# Patient Record
Sex: Female | Born: 1950
Health system: Southern US, Community
[De-identification: ages and names within clinical notes are randomized; demographics above are authoritative.]

## PROBLEM LIST (undated history)

## (undated) DIAGNOSIS — H5340 Unspecified visual field defects: Secondary | ICD-10-CM

## (undated) DIAGNOSIS — E785 Hyperlipidemia, unspecified: Secondary | ICD-10-CM

## (undated) DIAGNOSIS — D259 Leiomyoma of uterus, unspecified: Secondary | ICD-10-CM

## (undated) DIAGNOSIS — E860 Dehydration: Secondary | ICD-10-CM

## (undated) DIAGNOSIS — R5381 Other malaise: Secondary | ICD-10-CM

## (undated) DIAGNOSIS — N289 Disorder of kidney and ureter, unspecified: Secondary | ICD-10-CM

## (undated) DIAGNOSIS — N182 Chronic kidney disease, stage 2 (mild): Secondary | ICD-10-CM

## (undated) DIAGNOSIS — J039 Acute tonsillitis, unspecified: Secondary | ICD-10-CM

## (undated) DIAGNOSIS — N189 Chronic kidney disease, unspecified: Secondary | ICD-10-CM

## (undated) DIAGNOSIS — K37 Unspecified appendicitis: Secondary | ICD-10-CM

## (undated) DIAGNOSIS — I10 Essential (primary) hypertension: Secondary | ICD-10-CM

## (undated) DIAGNOSIS — H269 Unspecified cataract: Secondary | ICD-10-CM

## (undated) DIAGNOSIS — Z8669 Personal history of other diseases of the nervous system and sense organs: Secondary | ICD-10-CM

## (undated) DIAGNOSIS — I69391 Dysphagia following cerebral infarction: Secondary | ICD-10-CM

## (undated) DIAGNOSIS — R102 Pelvic and perineal pain: Secondary | ICD-10-CM

## (undated) DIAGNOSIS — R5383 Other fatigue: Secondary | ICD-10-CM

## (undated) DIAGNOSIS — I509 Heart failure, unspecified: Secondary | ICD-10-CM

## (undated) DIAGNOSIS — J45909 Unspecified asthma, uncomplicated: Secondary | ICD-10-CM

## (undated) DIAGNOSIS — R0600 Dyspnea, unspecified: Secondary | ICD-10-CM

## (undated) DIAGNOSIS — D219 Benign neoplasm of connective and other soft tissue, unspecified: Secondary | ICD-10-CM

## (undated) DIAGNOSIS — Z992 Dependence on renal dialysis: Secondary | ICD-10-CM

## (undated) DIAGNOSIS — J342 Deviated nasal septum: Secondary | ICD-10-CM

## (undated) DIAGNOSIS — K922 Gastrointestinal hemorrhage, unspecified: Secondary | ICD-10-CM

## (undated) DIAGNOSIS — D649 Anemia, unspecified: Secondary | ICD-10-CM

## (undated) DIAGNOSIS — N644 Mastodynia: Secondary | ICD-10-CM

## (undated) DIAGNOSIS — R011 Cardiac murmur, unspecified: Secondary | ICD-10-CM

## (undated) DIAGNOSIS — E669 Obesity, unspecified: Secondary | ICD-10-CM

## (undated) DIAGNOSIS — Z6837 Body mass index (BMI) 37.0-37.9, adult: Secondary | ICD-10-CM

## (undated) DIAGNOSIS — R0789 Other chest pain: Secondary | ICD-10-CM

## (undated) DIAGNOSIS — K4090 Unilateral inguinal hernia, without obstruction or gangrene, not specified as recurrent: Secondary | ICD-10-CM

## (undated) DIAGNOSIS — T8859XA Other complications of anesthesia, initial encounter: Secondary | ICD-10-CM

## (undated) DIAGNOSIS — N186 End stage renal disease: Secondary | ICD-10-CM

## (undated) DIAGNOSIS — Z8632 Personal history of gestational diabetes: Secondary | ICD-10-CM

## (undated) DIAGNOSIS — O0990 Supervision of high risk pregnancy, unspecified, unspecified trimester: Secondary | ICD-10-CM

## (undated) DIAGNOSIS — K219 Gastro-esophageal reflux disease without esophagitis: Secondary | ICD-10-CM

## (undated) DIAGNOSIS — T7840XA Allergy, unspecified, initial encounter: Secondary | ICD-10-CM

## (undated) DIAGNOSIS — Z87898 Personal history of other specified conditions: Secondary | ICD-10-CM

## (undated) HISTORY — DX: Unspecified cataract: H26.9

## (undated) HISTORY — DX: Unspecified appendicitis: K37

## (undated) HISTORY — PX: MYOMECTOMY: SHX85

## (undated) HISTORY — DX: Other chest pain: R07.89

## (undated) HISTORY — DX: Leiomyoma of uterus, unspecified: D25.9

## (undated) HISTORY — DX: Benign neoplasm of connective and other soft tissue, unspecified: D21.9

## (undated) HISTORY — DX: Personal history of gestational diabetes: Z86.32

## (undated) HISTORY — DX: Supervision of high risk pregnancy, unspecified, unspecified trimester: O09.90

## (undated) HISTORY — DX: Hyperlipidemia, unspecified: E78.5

## (undated) HISTORY — PX: EYE SURGERY: SHX253

## (undated) HISTORY — DX: Gastro-esophageal reflux disease without esophagitis: K21.9

## (undated) HISTORY — DX: Unspecified asthma, uncomplicated: J45.909

## (undated) HISTORY — DX: Mastodynia: N64.4

## (undated) HISTORY — DX: Allergy, unspecified, initial encounter: T78.40XA

## (undated) HISTORY — DX: Obesity, unspecified: E66.9

## (undated) HISTORY — PX: COLONOSCOPY: SHX174

## (undated) HISTORY — DX: Body mass index (BMI) 37.0-37.9, adult: Z68.37

## (undated) HISTORY — DX: Unilateral inguinal hernia, without obstruction or gangrene, not specified as recurrent: K40.90

## (undated) HISTORY — DX: Acute tonsillitis, unspecified: J03.90

## (undated) HISTORY — DX: Other fatigue: R53.83

## (undated) HISTORY — DX: Other malaise: R53.81

## (undated) HISTORY — DX: Disorder of kidney and ureter, unspecified: N28.9

## (undated) HISTORY — DX: Deviated nasal septum: J34.2

## (undated) HISTORY — DX: Cardiac murmur, unspecified: R01.1

## (undated) HISTORY — DX: Dyspnea, unspecified: R06.00

## (undated) HISTORY — DX: Essential (primary) hypertension: I10

## (undated) HISTORY — DX: Chronic kidney disease, stage 2 (mild): N18.2

## (undated) HISTORY — DX: Dehydration: E86.0

## (undated) HISTORY — DX: Pelvic and perineal pain: R10.2

---
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
## 1969-10-24 DIAGNOSIS — J342 Deviated nasal septum: Secondary | ICD-10-CM

## 1969-10-24 HISTORY — PX: RHINOPLASTY: SUR1284

## 1969-10-24 HISTORY — DX: Deviated nasal septum: J34.2

## 1978-10-24 DIAGNOSIS — D259 Leiomyoma of uterus, unspecified: Secondary | ICD-10-CM

## 1978-10-24 DIAGNOSIS — D219 Benign neoplasm of connective and other soft tissue, unspecified: Secondary | ICD-10-CM

## 1978-10-24 HISTORY — DX: Benign neoplasm of connective and other soft tissue, unspecified: D21.9

## 1978-10-24 HISTORY — DX: Leiomyoma of uterus, unspecified: D25.9

## 1983-10-25 DIAGNOSIS — O0990 Supervision of high risk pregnancy, unspecified, unspecified trimester: Secondary | ICD-10-CM

## 1983-10-25 HISTORY — DX: Supervision of high risk pregnancy, unspecified, unspecified trimester: O09.90

## 1998-09-09 ENCOUNTER — Other Ambulatory Visit: Admission: RE | Admit: 1998-09-09 | Discharge: 1998-09-09 | Payer: Self-pay | Admitting: Obstetrics and Gynecology

## 1998-10-15 ENCOUNTER — Ambulatory Visit (HOSPITAL_COMMUNITY): Admission: RE | Admit: 1998-10-15 | Discharge: 1998-10-15 | Payer: Self-pay | Admitting: Obstetrics and Gynecology

## 1998-10-15 ENCOUNTER — Encounter: Payer: Self-pay | Admitting: Obstetrics and Gynecology

## 1998-11-05 ENCOUNTER — Other Ambulatory Visit: Admission: RE | Admit: 1998-11-05 | Discharge: 1998-11-05 | Payer: Self-pay | Admitting: Obstetrics and Gynecology

## 1998-12-25 ENCOUNTER — Encounter: Payer: Self-pay | Admitting: Emergency Medicine

## 1998-12-25 ENCOUNTER — Inpatient Hospital Stay (HOSPITAL_COMMUNITY): Admission: EM | Admit: 1998-12-25 | Discharge: 1998-12-27 | Payer: Self-pay | Admitting: Emergency Medicine

## 1999-12-29 ENCOUNTER — Ambulatory Visit (HOSPITAL_COMMUNITY): Admission: RE | Admit: 1999-12-29 | Discharge: 1999-12-29 | Payer: Self-pay | Admitting: Obstetrics and Gynecology

## 1999-12-29 ENCOUNTER — Encounter: Payer: Self-pay | Admitting: Obstetrics and Gynecology

## 2000-01-28 ENCOUNTER — Other Ambulatory Visit: Admission: RE | Admit: 2000-01-28 | Discharge: 2000-01-28 | Payer: Self-pay | Admitting: Obstetrics & Gynecology

## 2000-04-10 ENCOUNTER — Other Ambulatory Visit: Admission: RE | Admit: 2000-04-10 | Discharge: 2000-04-10 | Payer: Self-pay | Admitting: Obstetrics and Gynecology

## 2000-12-13 ENCOUNTER — Encounter: Payer: Self-pay | Admitting: Obstetrics and Gynecology

## 2000-12-13 ENCOUNTER — Encounter: Admission: RE | Admit: 2000-12-13 | Discharge: 2000-12-13 | Payer: Self-pay | Admitting: Obstetrics and Gynecology

## 2001-04-10 ENCOUNTER — Other Ambulatory Visit: Admission: RE | Admit: 2001-04-10 | Discharge: 2001-04-10 | Payer: Self-pay | Admitting: Obstetrics and Gynecology

## 2001-04-20 ENCOUNTER — Ambulatory Visit (HOSPITAL_COMMUNITY): Admission: RE | Admit: 2001-04-20 | Discharge: 2001-04-20 | Payer: Self-pay | Admitting: Obstetrics and Gynecology

## 2001-04-20 ENCOUNTER — Encounter: Payer: Self-pay | Admitting: Obstetrics and Gynecology

## 2001-05-24 ENCOUNTER — Encounter (INDEPENDENT_AMBULATORY_CARE_PROVIDER_SITE_OTHER): Payer: Self-pay | Admitting: Specialist

## 2001-05-24 ENCOUNTER — Ambulatory Visit (HOSPITAL_COMMUNITY): Admission: RE | Admit: 2001-05-24 | Discharge: 2001-05-24 | Payer: Self-pay | Admitting: Obstetrics and Gynecology

## 2001-05-24 ENCOUNTER — Encounter: Payer: Self-pay | Admitting: Obstetrics and Gynecology

## 2001-10-24 HISTORY — PX: ROTATOR CUFF REPAIR: SHX139

## 2001-12-18 ENCOUNTER — Encounter: Payer: Self-pay | Admitting: Obstetrics and Gynecology

## 2001-12-18 ENCOUNTER — Encounter: Admission: RE | Admit: 2001-12-18 | Discharge: 2001-12-18 | Payer: Self-pay | Admitting: Obstetrics and Gynecology

## 2002-04-09 ENCOUNTER — Ambulatory Visit (HOSPITAL_BASED_OUTPATIENT_CLINIC_OR_DEPARTMENT_OTHER): Admission: RE | Admit: 2002-04-09 | Discharge: 2002-04-09 | Payer: Self-pay | Admitting: Orthopaedic Surgery

## 2002-06-03 ENCOUNTER — Other Ambulatory Visit: Admission: RE | Admit: 2002-06-03 | Discharge: 2002-06-03 | Payer: Self-pay | Admitting: Obstetrics and Gynecology

## 2003-01-02 ENCOUNTER — Encounter: Payer: Self-pay | Admitting: Obstetrics and Gynecology

## 2003-01-02 ENCOUNTER — Encounter: Admission: RE | Admit: 2003-01-02 | Discharge: 2003-01-02 | Payer: Self-pay | Admitting: Obstetrics and Gynecology

## 2003-04-29 ENCOUNTER — Encounter: Payer: Self-pay | Admitting: Emergency Medicine

## 2003-04-29 ENCOUNTER — Emergency Department (HOSPITAL_COMMUNITY): Admission: EM | Admit: 2003-04-29 | Discharge: 2003-04-29 | Payer: Self-pay | Admitting: Emergency Medicine

## 2003-06-17 ENCOUNTER — Other Ambulatory Visit: Admission: RE | Admit: 2003-06-17 | Discharge: 2003-06-17 | Payer: Self-pay | Admitting: Obstetrics and Gynecology

## 2003-06-24 ENCOUNTER — Ambulatory Visit (HOSPITAL_COMMUNITY): Admission: RE | Admit: 2003-06-24 | Discharge: 2003-06-24 | Payer: Self-pay | Admitting: Obstetrics and Gynecology

## 2003-06-24 ENCOUNTER — Encounter: Payer: Self-pay | Admitting: Obstetrics and Gynecology

## 2004-01-21 ENCOUNTER — Encounter: Admission: RE | Admit: 2004-01-21 | Discharge: 2004-01-21 | Payer: Self-pay | Admitting: Internal Medicine

## 2004-07-13 ENCOUNTER — Other Ambulatory Visit: Admission: RE | Admit: 2004-07-13 | Discharge: 2004-07-13 | Payer: Self-pay | Admitting: Obstetrics and Gynecology

## 2004-12-03 ENCOUNTER — Ambulatory Visit (HOSPITAL_COMMUNITY): Admission: RE | Admit: 2004-12-03 | Discharge: 2004-12-03 | Payer: Self-pay | Admitting: Obstetrics and Gynecology

## 2004-12-03 IMAGING — US US PELVIS COMPLETE MODIFY
1 series · 18 of 25 positions shown · non-contrast
Comparison: none

CLINICAL DATA: Pelvic pain.  History of fibroids.
 TRANSVAGINAL AND TRANSABDOMINAL NON-OB PELVIC ULTRASOUND:
 Transabdominal and endovaginal images are obtained.  Multiple fibroids are again noted.  The overall uterine dimensions are 7.6 x 3.8 x 6.4 cm.  The largest fibroid is myometrial to the left of midline at the mid segment and measures 3.5 cm in greatest dimension.  There are 3 smaller right-sided myometrial fibroids at the mid segment and fundus which measure 2.7 cm, 2.2 cm, and 1 cm.  The endometrial stripe is only partially seen because of the adjacent fibroids, but the visualized portion is thin and homogeneous, measuring 4 mm in width.  Both ovaries have a normal size, shape, and appearance.  The right ovary measures 3.2 x 1.8 x 1.8 cm, and the left ovary measures 2.9 x 2 x 2.2 cm.  No free pelvic fluid is present.

[Series 1: us pelvis complete modify · 18 of 50 slices shown]
[im 1/50]
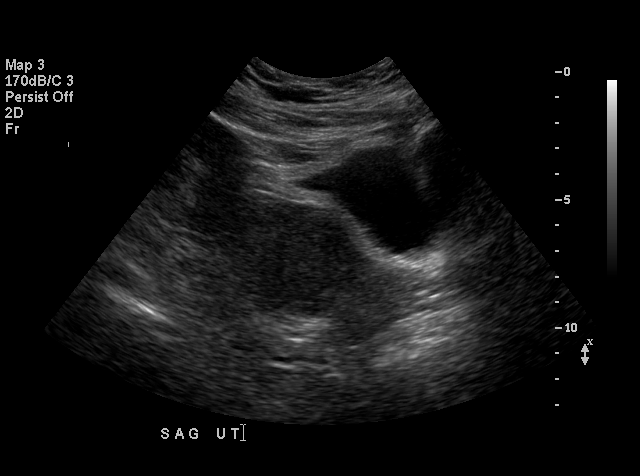
[im 5/50]
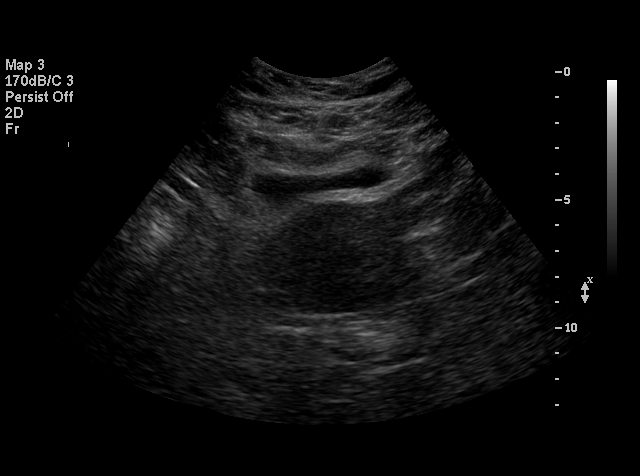
[im 7/50]
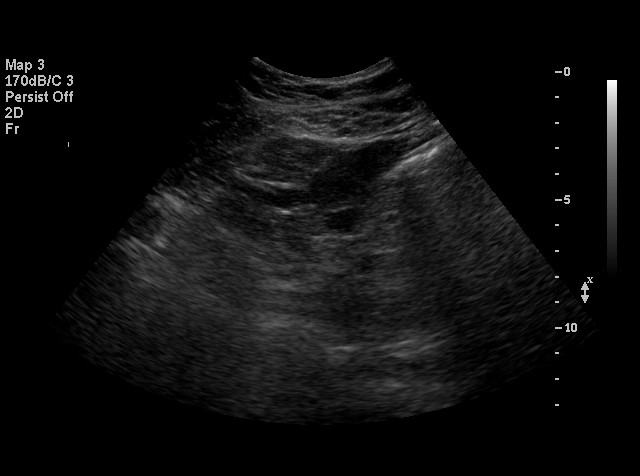
[im 9/50]
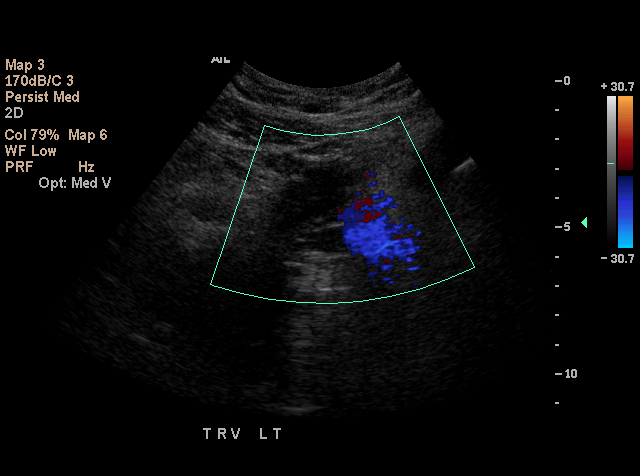
[im 13/50]
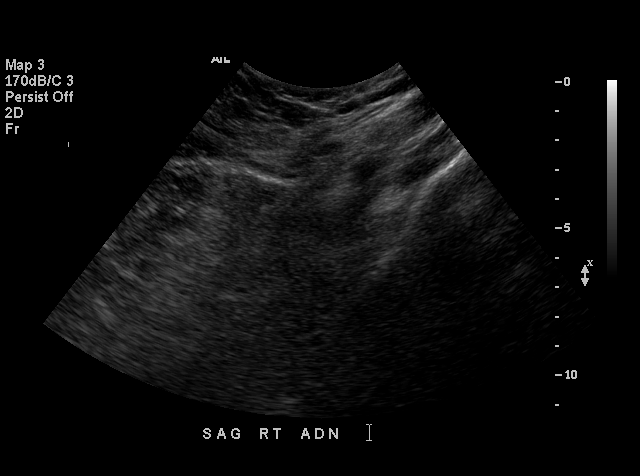
[im 15/50]
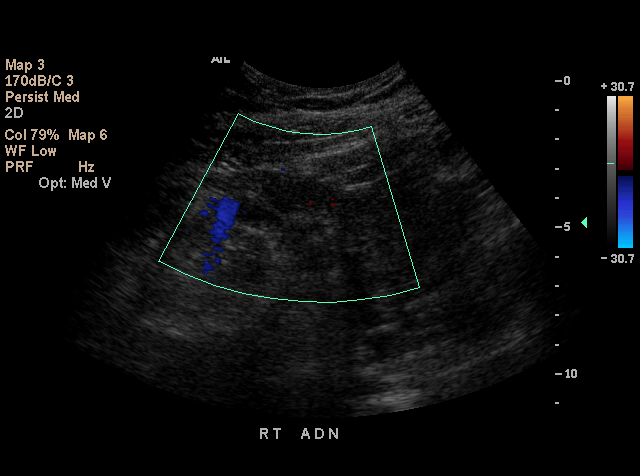
[im 19/50]
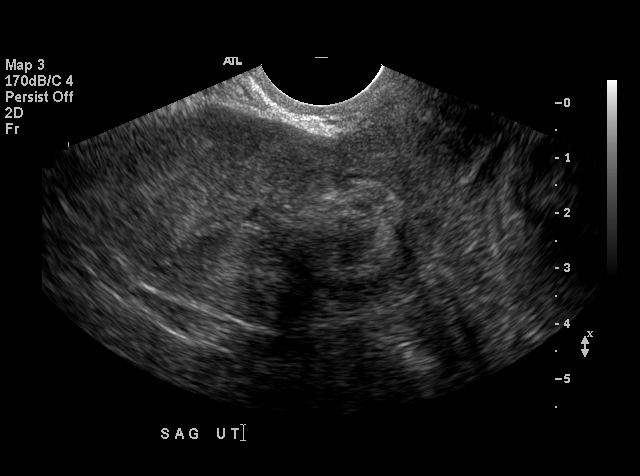
[im 21/50]
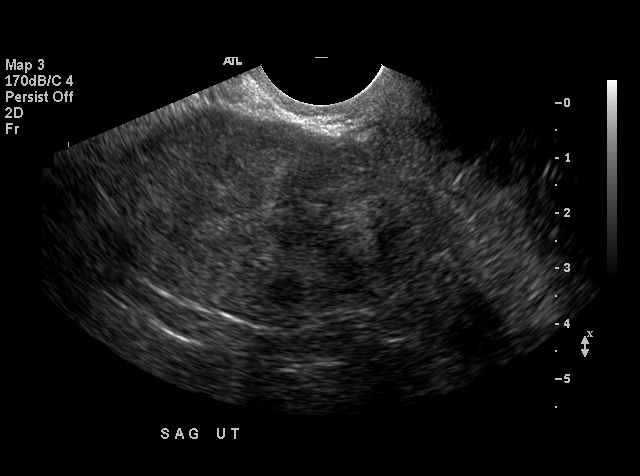
[im 23/50]
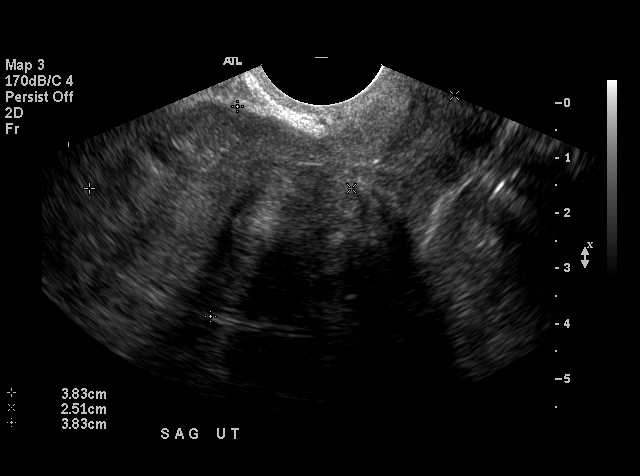
[im 27/50]
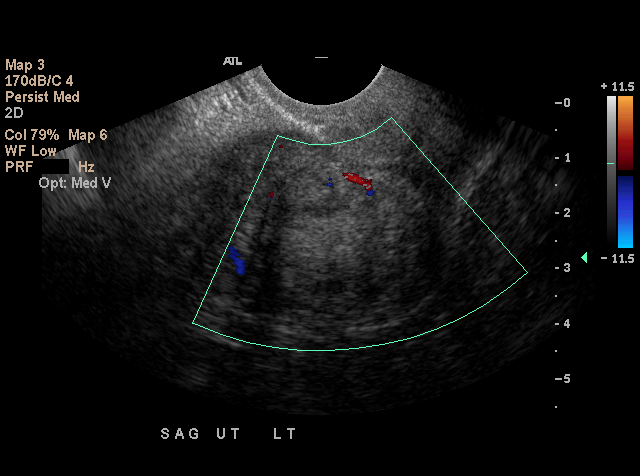
[im 29/50]
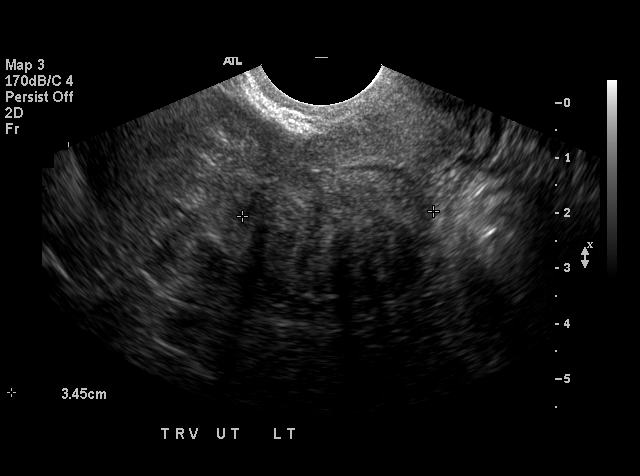
[im 31/50]
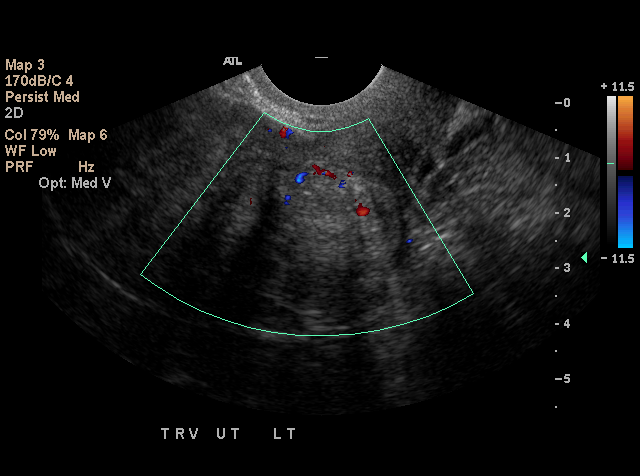
[im 35/50]
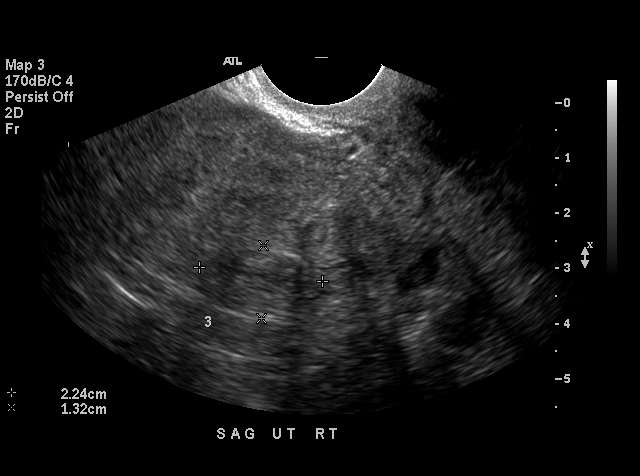
[im 37/50]
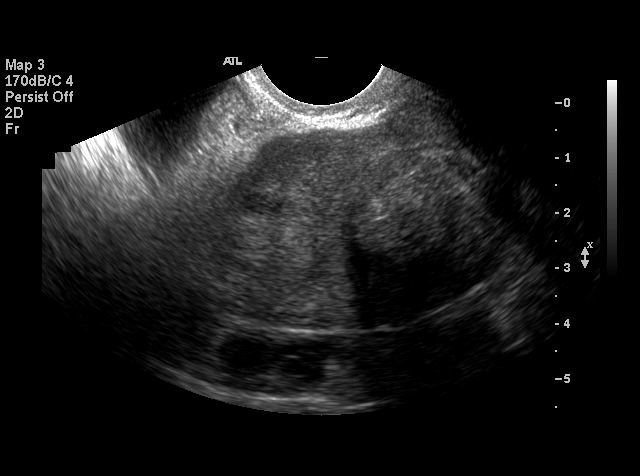
[im 41/50]
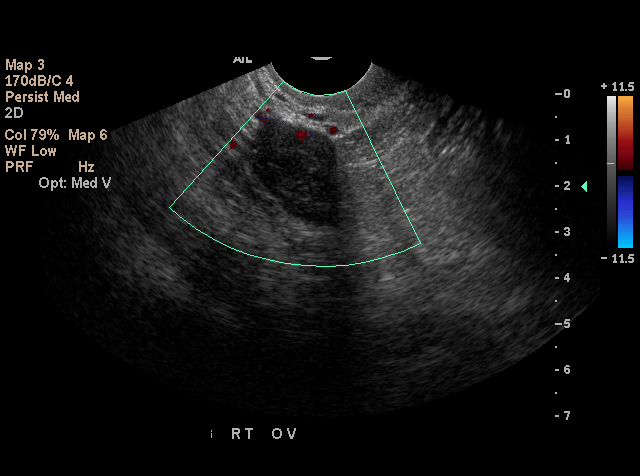
[im 43/50]
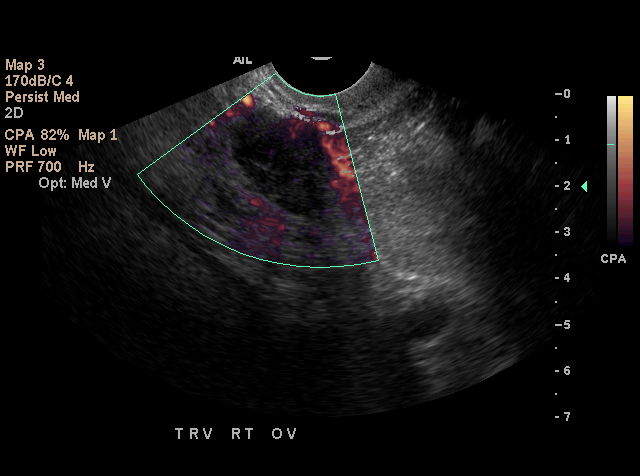
[im 45/50]
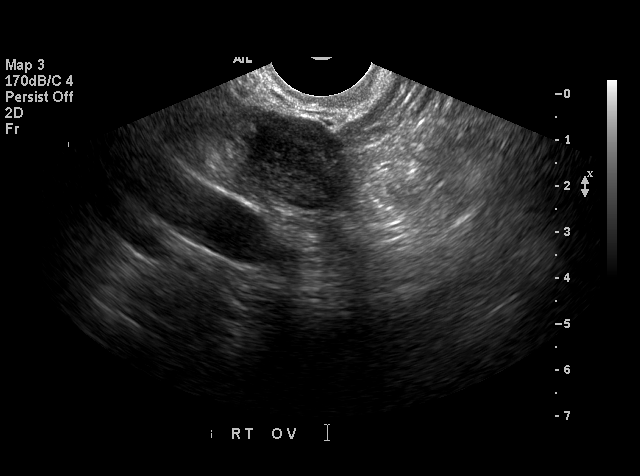
[im 50/50]
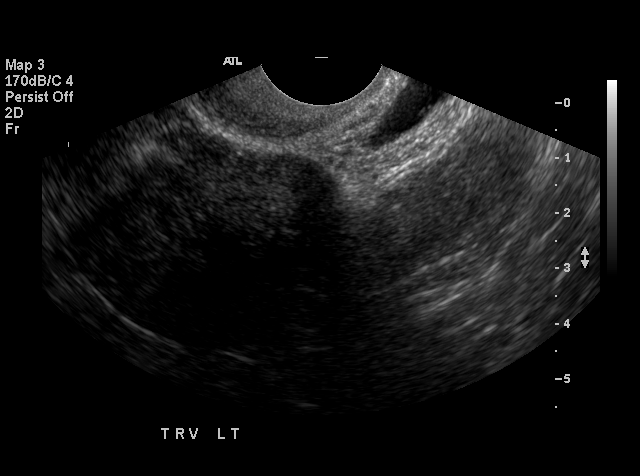

[18 of 25 positions shown; findings below may reference images not displayed]

IMPRESSION: Fibroid uterus, as described above.  The visualized portion of the endometrial stripe has a normal appearance.

## 2004-12-13 ENCOUNTER — Ambulatory Visit (HOSPITAL_COMMUNITY): Admission: RE | Admit: 2004-12-13 | Discharge: 2004-12-13 | Payer: Self-pay | Admitting: Obstetrics and Gynecology

## 2004-12-13 IMAGING — CT CT ABDOMEN W/ CM
1 of 4 series · 14 of 32 positions shown, 19 images · IV contrast ([ID] READICAT & [ID] OMNI [id number])
Comparison: none

CLINICAL DATA: Right lower quadrant pain and bloating for one month; previous appendectomy; inguinal hernia repair; C-section; myomectomy
TECHNIQUE: Images were obtained from the dome of the diaphragm through the symphysis pubis after intravenous and oral contrast.  150 mL of Omnipaque 300 was given intravenously.  
 CT ABDOMEN WITH CONTRAST:
 Lung bases are clear.  There is some pleural scarring at both bases.
 There is a low density lesion in the posterior segment of the right lobe of the liver medially that completely fills in with contrast on delayed images.  This is likely a small hemangioma.  No abnormality is noted in the spleen.  Adrenal glands and kidneys are normal.  Pancreas and gallbladder are normal.  No abdominal mass or ascites is noted.  No retroperitoneal adenopathy is seen.

[Series 2: — · axial · 0.68mm/px · z∈[-447,-49]mm · 14 of 76 slices shown, 19 images]
[im 5/76  soft-tissue]
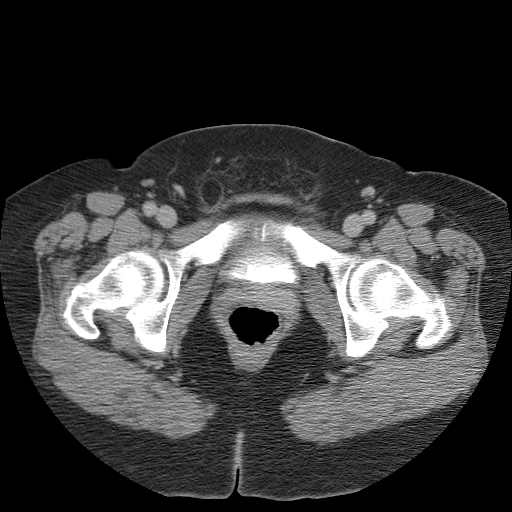
[im 5/76  bone]
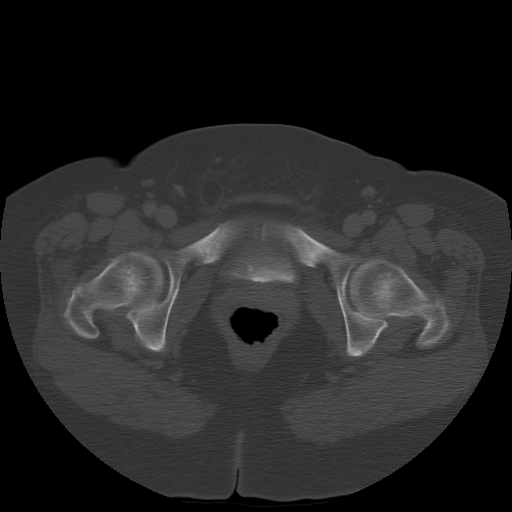
[im 9/76  soft-tissue]
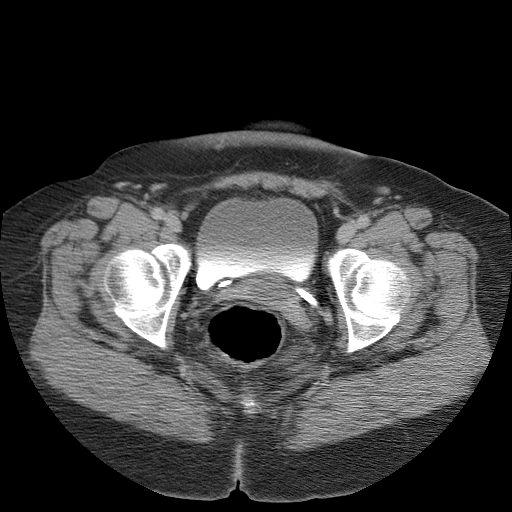
[im 17/76  soft-tissue]
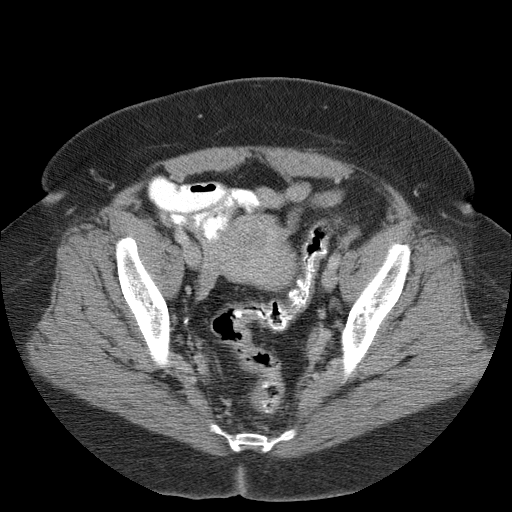
[im 21/76  soft-tissue]
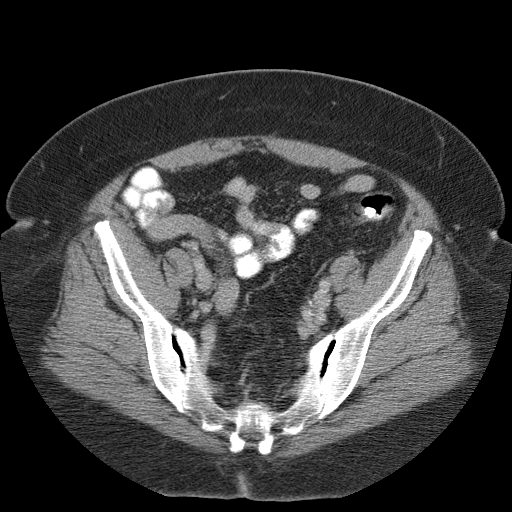
[im 26/76  soft-tissue]
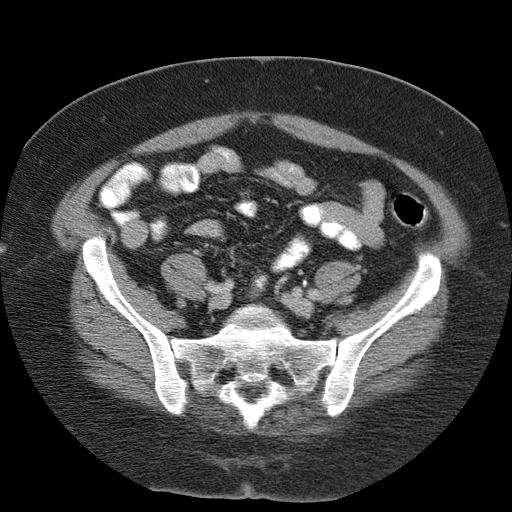
[im 34/76  soft-tissue]
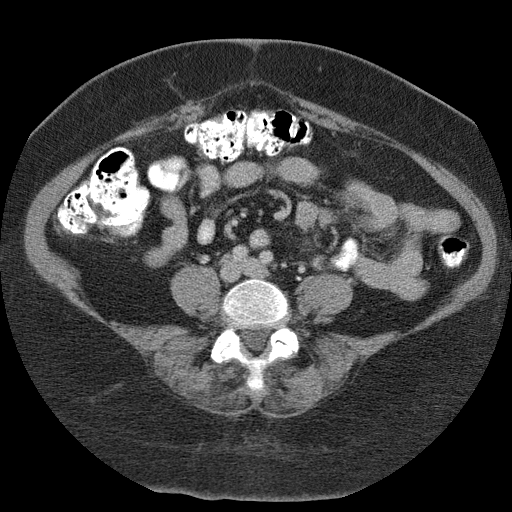
[im 38/76  soft-tissue]
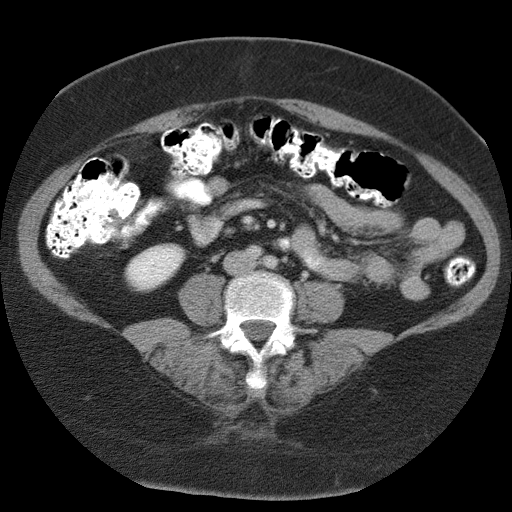
[im 42/76  soft-tissue]
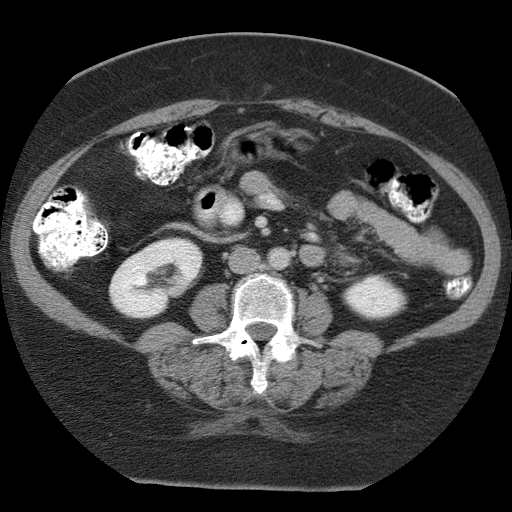
[im 51/76  soft-tissue]
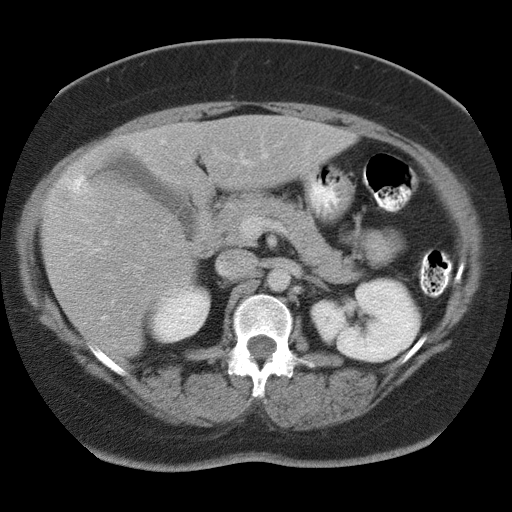
[im 51/76  bone]
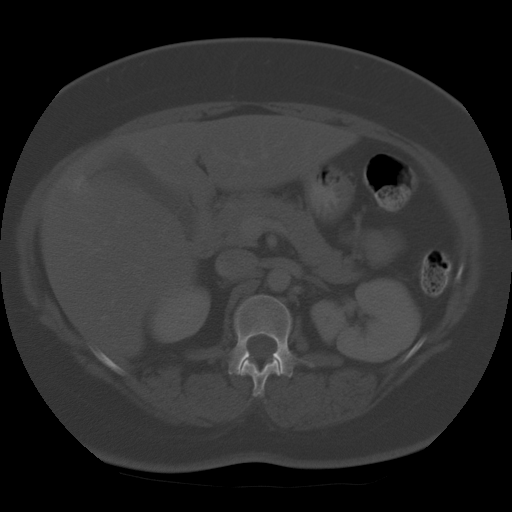
[im 55/76  soft-tissue]
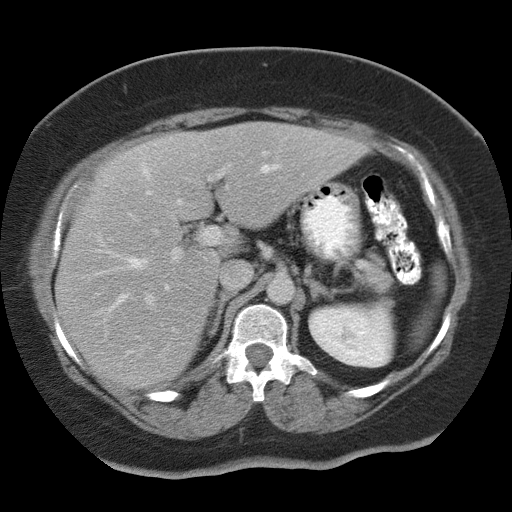
[im 59/76  soft-tissue]
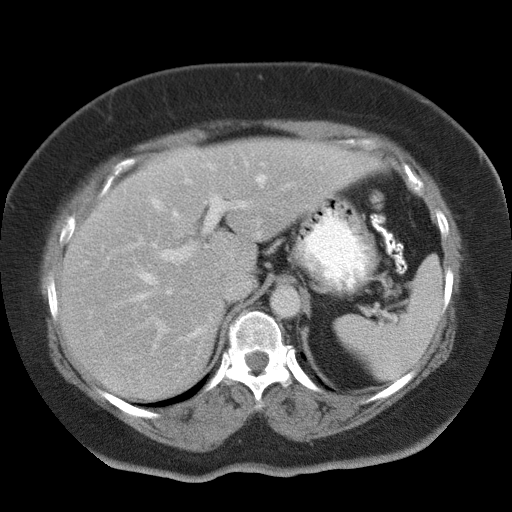
[im 59/76  lung]
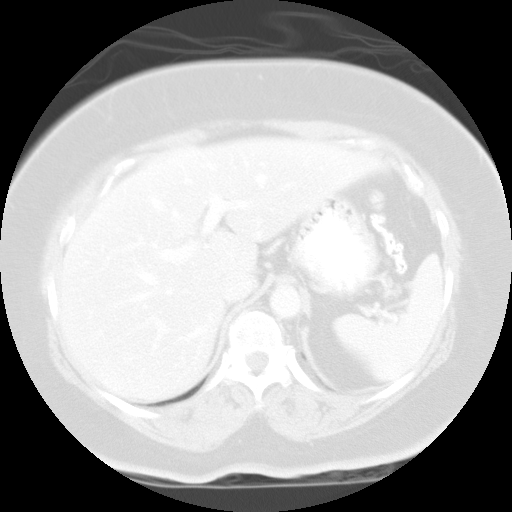
[im 63/76  lung]
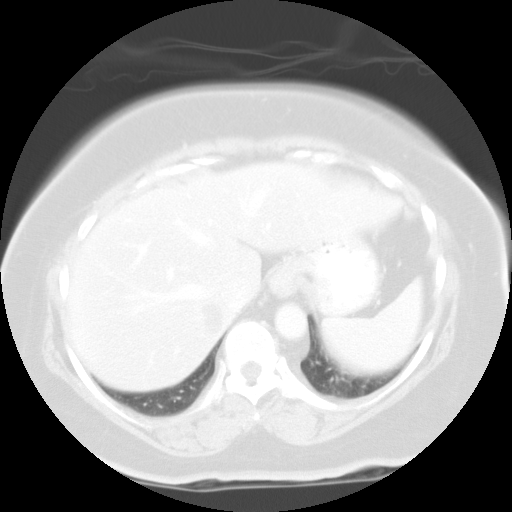
[im 67/76  soft-tissue]
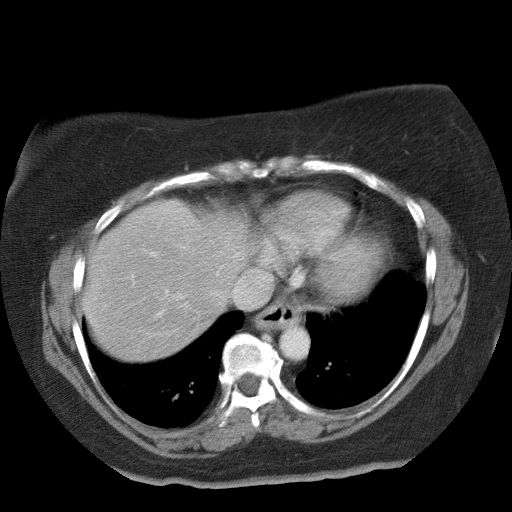
[im 67/76  lung]
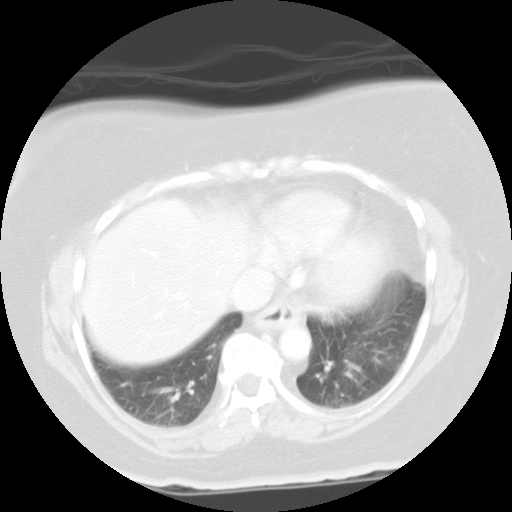
[im 71/76  soft-tissue]
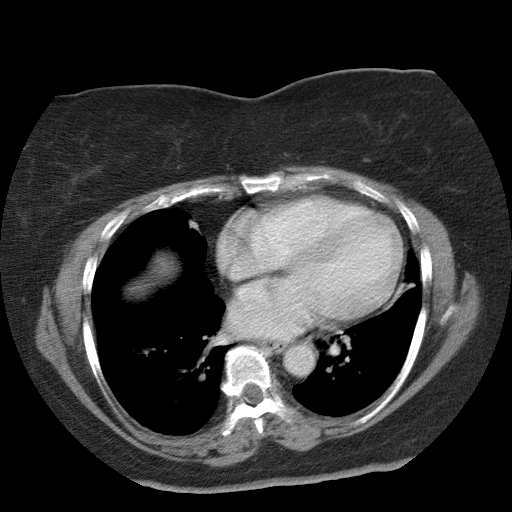
[im 71/76  lung]
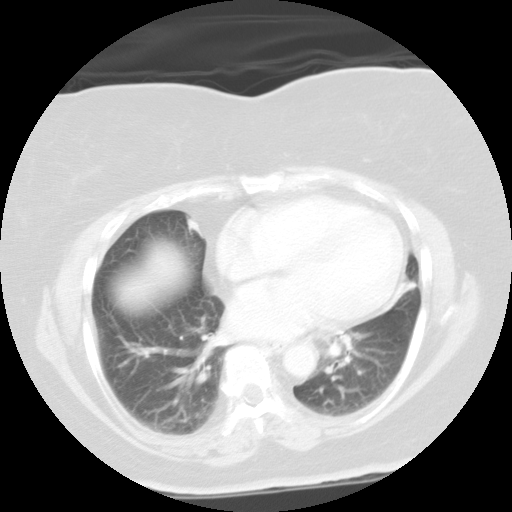

[14 of 32 positions shown; findings below may reference images not displayed]

IMPRESSION: Small hemangioma in the liver.  Mild pleural scarring at the lung bases.  No other abnormality noted. 
 CT OF THE PELVIS WITH CONTRAST:
 No pelvic mass or adenopathy is identified.  No free pelvic fluid is noted.  Uterus and ovaries have a normal contour.
IMPRESSION: Normal CT pelvis.

## 2005-01-13 ENCOUNTER — Ambulatory Visit: Payer: Self-pay | Admitting: Gastroenterology

## 2005-02-08 ENCOUNTER — Ambulatory Visit (HOSPITAL_COMMUNITY): Admission: RE | Admit: 2005-02-08 | Discharge: 2005-02-08 | Payer: Self-pay | Admitting: Gastroenterology

## 2005-02-08 ENCOUNTER — Ambulatory Visit: Payer: Self-pay | Admitting: Gastroenterology

## 2005-11-30 ENCOUNTER — Encounter: Admission: RE | Admit: 2005-11-30 | Discharge: 2005-11-30 | Payer: Self-pay | Admitting: Obstetrics and Gynecology

## 2005-12-13 ENCOUNTER — Other Ambulatory Visit: Admission: RE | Admit: 2005-12-13 | Discharge: 2005-12-13 | Payer: Self-pay | Admitting: Obstetrics and Gynecology

## 2006-04-28 ENCOUNTER — Ambulatory Visit (HOSPITAL_COMMUNITY): Admission: RE | Admit: 2006-04-28 | Discharge: 2006-04-28 | Payer: Self-pay | Admitting: Obstetrics and Gynecology

## 2006-04-28 ENCOUNTER — Encounter (INDEPENDENT_AMBULATORY_CARE_PROVIDER_SITE_OTHER): Payer: Self-pay | Admitting: Specialist

## 2006-12-12 ENCOUNTER — Encounter: Admission: RE | Admit: 2006-12-12 | Discharge: 2006-12-12 | Payer: Self-pay | Admitting: Obstetrics and Gynecology

## 2007-01-11 ENCOUNTER — Ambulatory Visit (HOSPITAL_COMMUNITY): Admission: RE | Admit: 2007-01-11 | Discharge: 2007-01-11 | Payer: Self-pay | Admitting: Interventional Cardiology

## 2007-01-11 IMAGING — CT CT HEART WO/W CTA ONLY W/ CA
1 of 4 series · 12 of 20 positions shown, 15 images · IV contrast (APPLIED)
Comparison: None available.

Addendum Begins?Original report by Dr. DEVJAKOVITS.  Following addendum by Dr. DEVJAKOVITS, [DATE].?
 OVER-READ INTERPRETATION ? CHEST CT:
 The following report provided by [REDACTED] is an over-read interpretation of the non-cardiac portion of this coronary CTA study.  This does not include interpretation of cardiac or coronary anatomy or pathology.
 Extracardiac Findings:  No significant lymphadenopathy within the visualized mediastinum.  Moderate-sized hiatal hernia.  The visualized upper abdomen is otherwise unremarkable.  Focus of nodular scarring deep in the lingula with associated adjacent pleural thickening and scarring.  No significant abnormalities within the visualized lungs.  Mid and lower thoracic spondylosis noted with mild scoliosis convex right.
CLINICAL DATA: Dr. DEVJAKOVITS is 56 years of age and has longstanding diabetes, obesity, and has recently been having chest discomfort.  An EKG was performed that demonstrated poor R-wave progression.  Given her risk factors, coronary CT angio is being performed to rule out obstructive coronary disease.  She is not felt to be a good candidate for myocardial perfusion imaging because of large breasts that increase the likelihood of soft tissue attenuation artifact.
CORONARY CT ANGIOGRAPHY, INCLUDING EVALUATION OF CARDIAC MORPHOLOGY AND LEFT VENTRICULAR FUNCTION:
Referring physicians: DEVJAKOVITS.; DEVJAKOVITS, M.D.
TECHNIQUE: The patient received 75 mg of Lopressor within the 12 hours prior to the examination and an additional 20 mg of IV Lopressor resulting in heart rates in the mid 60?s during scanning. 
An AP topogram unenhanced CT through the heart was performed at 3-mm collimation for the purposes of calcium scoring.  20 cc of Omnipaque 350 was used as a test bolus through the region of interest, placed in the ascending aorta just above the origin of the coronary arteries.  She then received an additional 80 cc of Omnipaque 350 for the purpose of coronary CT angiography followed by a saline chaser bolus.  Gantry speed was 330 milliseconds using 0.6-mm collimation with 0.4-mm overlap.

[Series 10: 70% only · axial · 0.45mm/px · z∈[-261,-170]mm · 12 of 268 slices shown, 15 images]
[im 20/268  vessel]
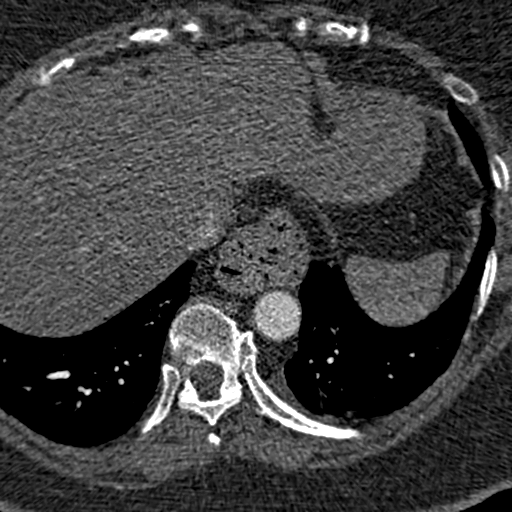
[im 20/268  lung]
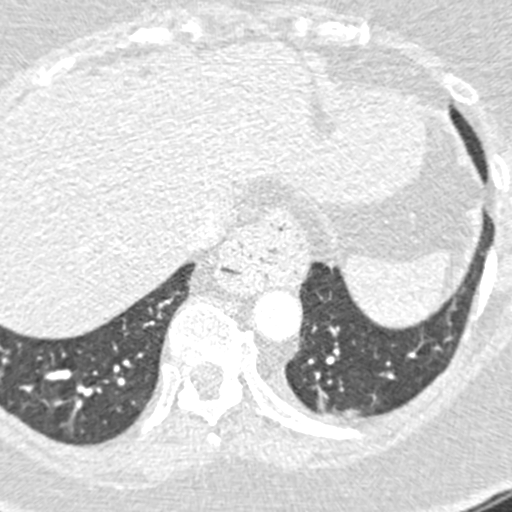
[im 39/268  vessel]
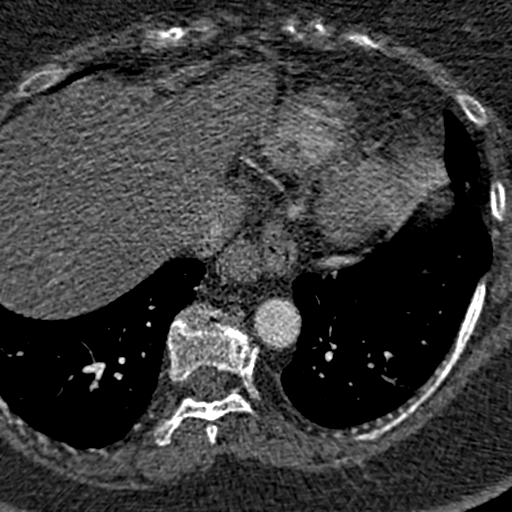
[im 58/268  vessel]
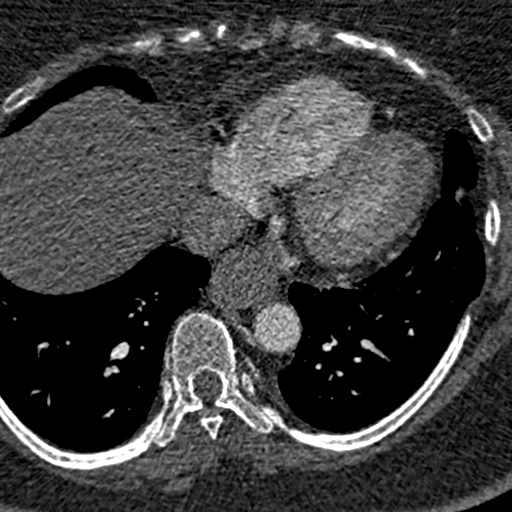
[im 77/268  vessel]
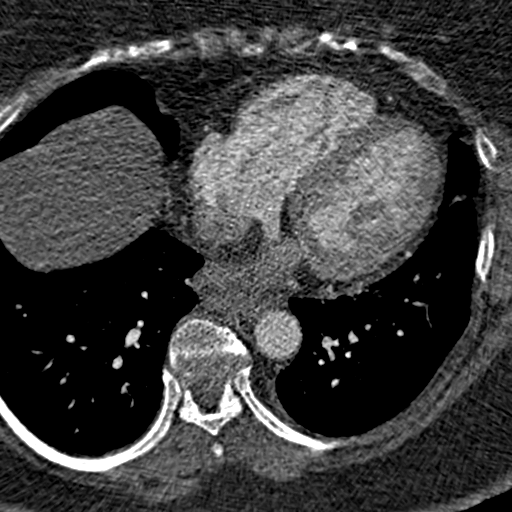
[im 96/268  vessel]
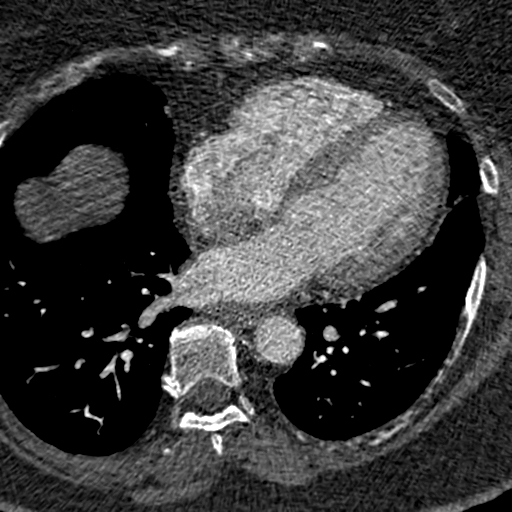
[im 96/268  lung]
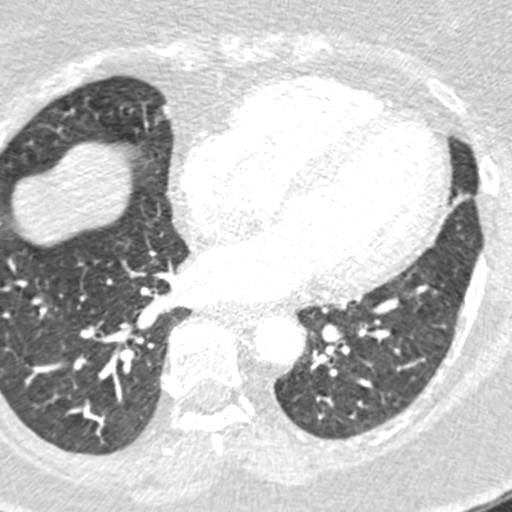
[im 115/268  vessel]
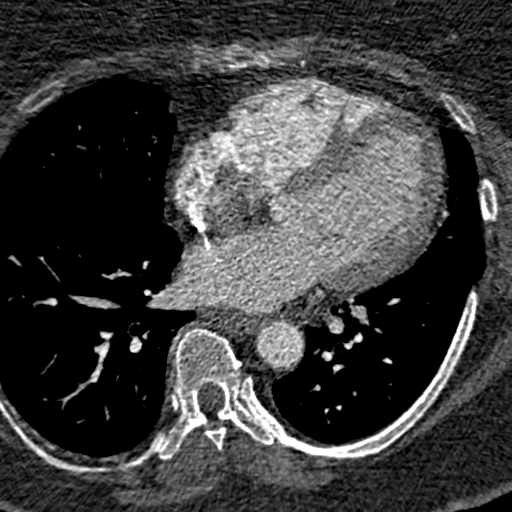
[im 153/268  vessel]
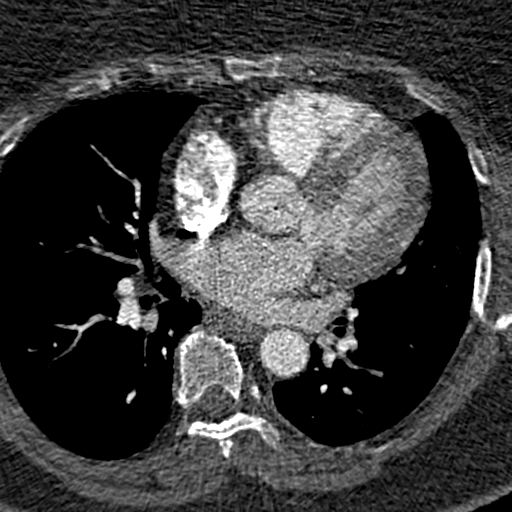
[im 172/268  vessel]
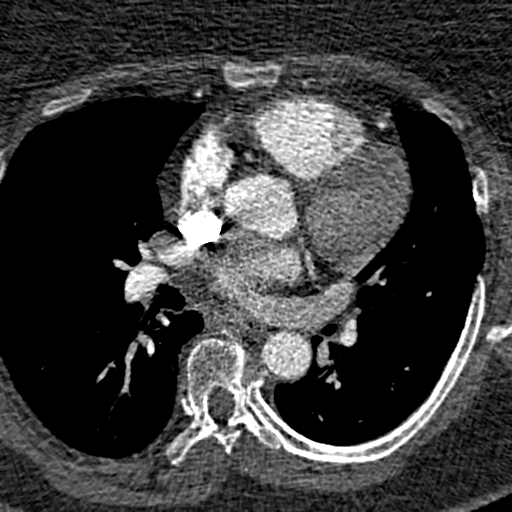
[im 191/268  vessel]
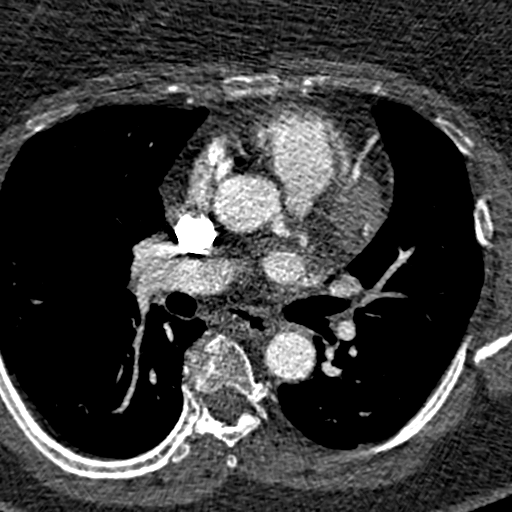
[im 191/268  lung]
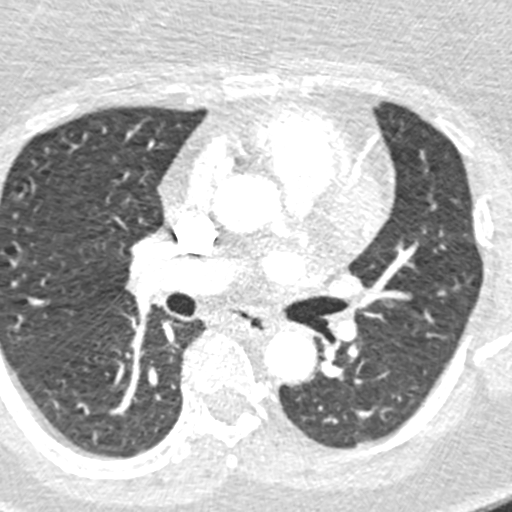
[im 210/268  vessel]
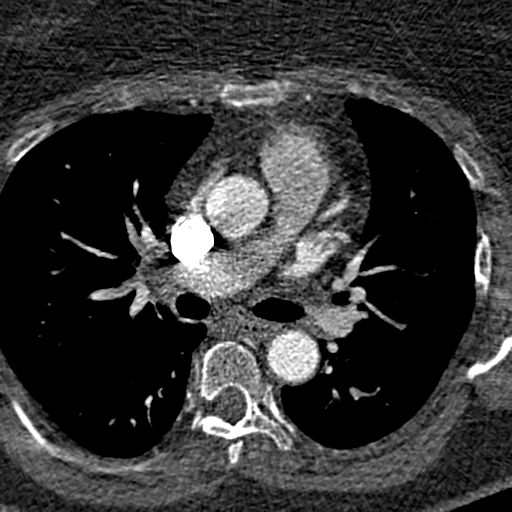
[im 229/268  vessel]
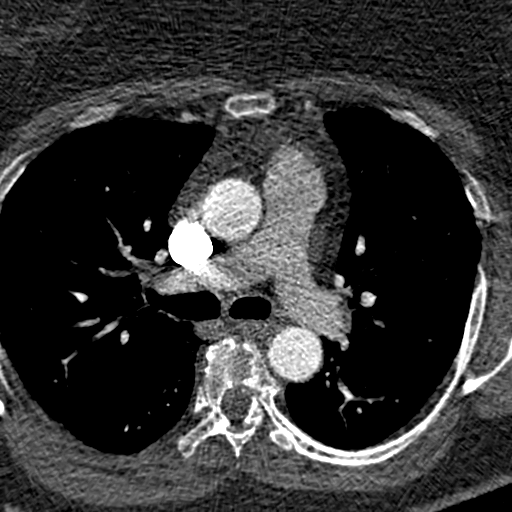
[im 248/268  vessel]
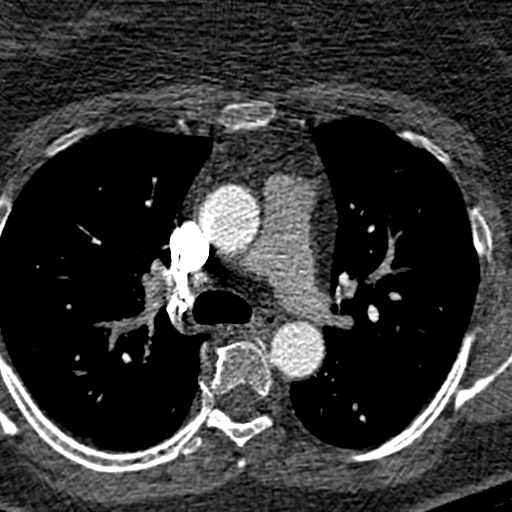

[12 of 20 positions shown; findings below may reference images not displayed]

IMPRESSION: 1.  Moderate-sized hiatal hernia. 
 2.  No significant extracardiac findings otherwise. 

 Addendum Ends
FINDINGS: The extracardiac findings will be interpreted by Dr. DEVJAKOVITS. 
The patient's cardiac morphology is normal.  The four pulmonary veins drain normally into the left atrium.  The right ventricle is upper normal in size.  This study unfortunately had increased contrast in the right heart, making interpretation of the right coronary difficult.  Both left and right ventricular function appeared normal.  Left and right atrial cavity sizes are normal.  Left ventricular ejection fraction is estimated to be 65%.  No evidence of left ventricular hypertrophy is noted.  No regional wall motion abnormalities are seen. 
The aorta and pulmonary artery appear normal.  No evidence of pulmonary emboli are noted in the central pulmonary vascular bed. 
The coronary CT angiography demonstrated the following:  
The left main is normal. 
The left anterior descending coronary artery is a large vessel that wraps around the left ventricular apex.  There is eccentric proximal calcification, as well as soft plaque in the LAD that obstructs the vessel up to but not greater than 50%.  significant diagonals arise from the LAD.
A large ramus branch arises from the left main, branches on the anterolateral wall, and is free of any significant obstruction. 
The circumflex coronary artery gives origin to two obtuse marginal branches.  The first, arising from the mid vessel, is small and not clearly visualized.  A more distal obtuse marginal is also seen to arise from the circumflex and is also not well visualized..  The proximal and mid circumflex body is widely patent. 
The right coronary artery is a dominant vessel.  It is poorly visualized because of contrast located in the right ventricle due to difficulty with bolus timing.  No significant obstruction is felt to be present in the right coronary. 
Coronary artery calcium scoring is 17.
IMPRESSION: 1.  There is nonobstructive soft and calcified plaque in the proximal LAD that is nonobstructive with less than 50% stenosis present.
2.  The left main, ramus intermedius, circumflex, and right coronary arteries are felt to be patent without significant soft or hard plaque noted.
3.  Normal left ventricular function.
4.  Noncardiac findings will appear in an  addendum by Dr. DEVJAKOVITS.

## 2007-02-08 ENCOUNTER — Encounter (HOSPITAL_COMMUNITY): Admission: RE | Admit: 2007-02-08 | Discharge: 2007-05-09 | Payer: Self-pay | Admitting: Interventional Cardiology

## 2007-05-10 ENCOUNTER — Encounter (HOSPITAL_COMMUNITY): Admission: RE | Admit: 2007-05-10 | Discharge: 2007-08-08 | Payer: Self-pay | Admitting: Interventional Cardiology

## 2007-12-21 ENCOUNTER — Encounter: Admission: RE | Admit: 2007-12-21 | Discharge: 2007-12-21 | Payer: Self-pay | Admitting: Obstetrics and Gynecology

## 2007-12-31 ENCOUNTER — Encounter: Admission: RE | Admit: 2007-12-31 | Discharge: 2007-12-31 | Payer: Self-pay | Admitting: Internal Medicine

## 2007-12-31 IMAGING — CR DG CHEST 2V
2 series · 2 of 2 positions shown · non-contrast
Comparison: CT, [DATE].

CLINICAL DATA: Cough.   Pneumonia. 
 CHEST - 2 VIEW:

[w chest pa]
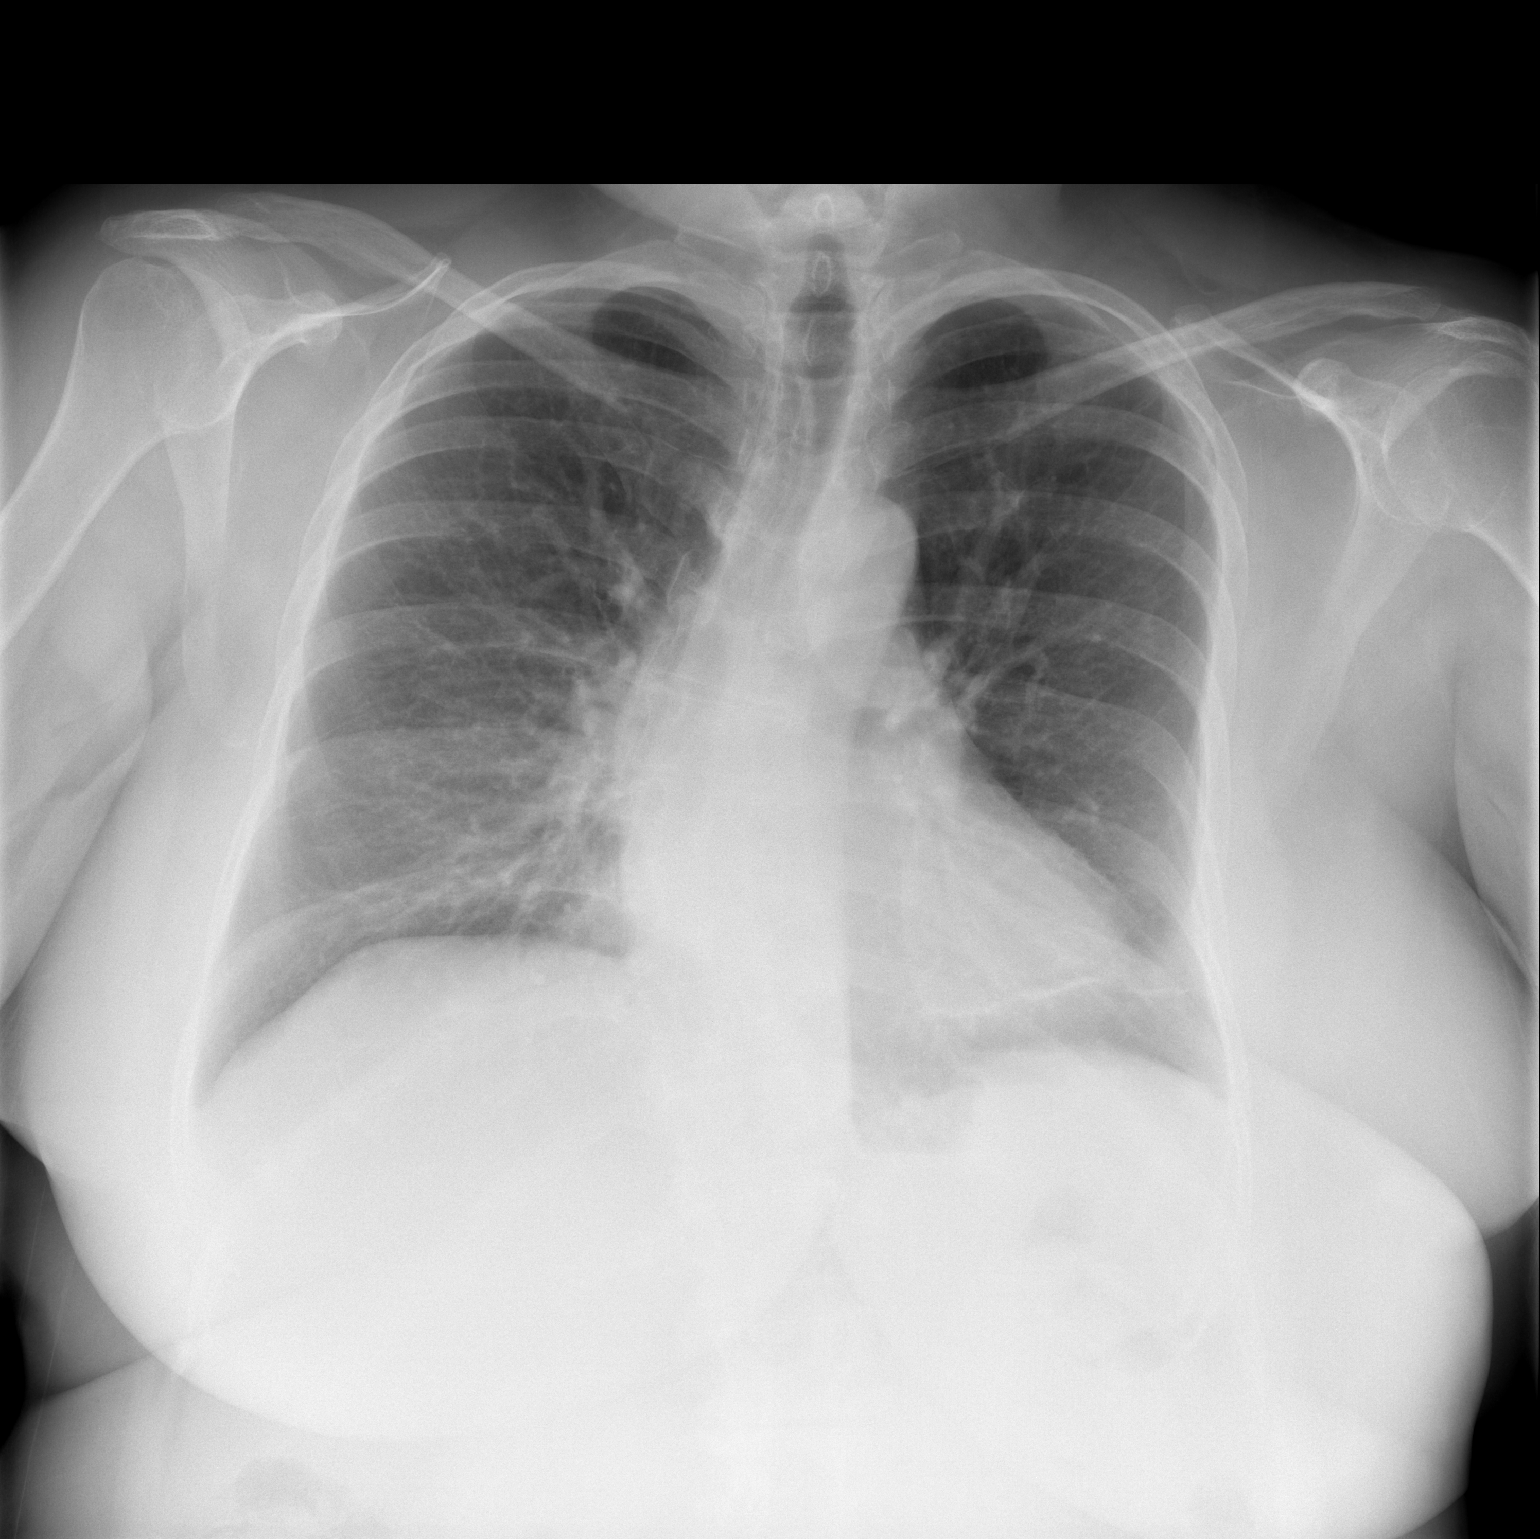

[w chest lat]
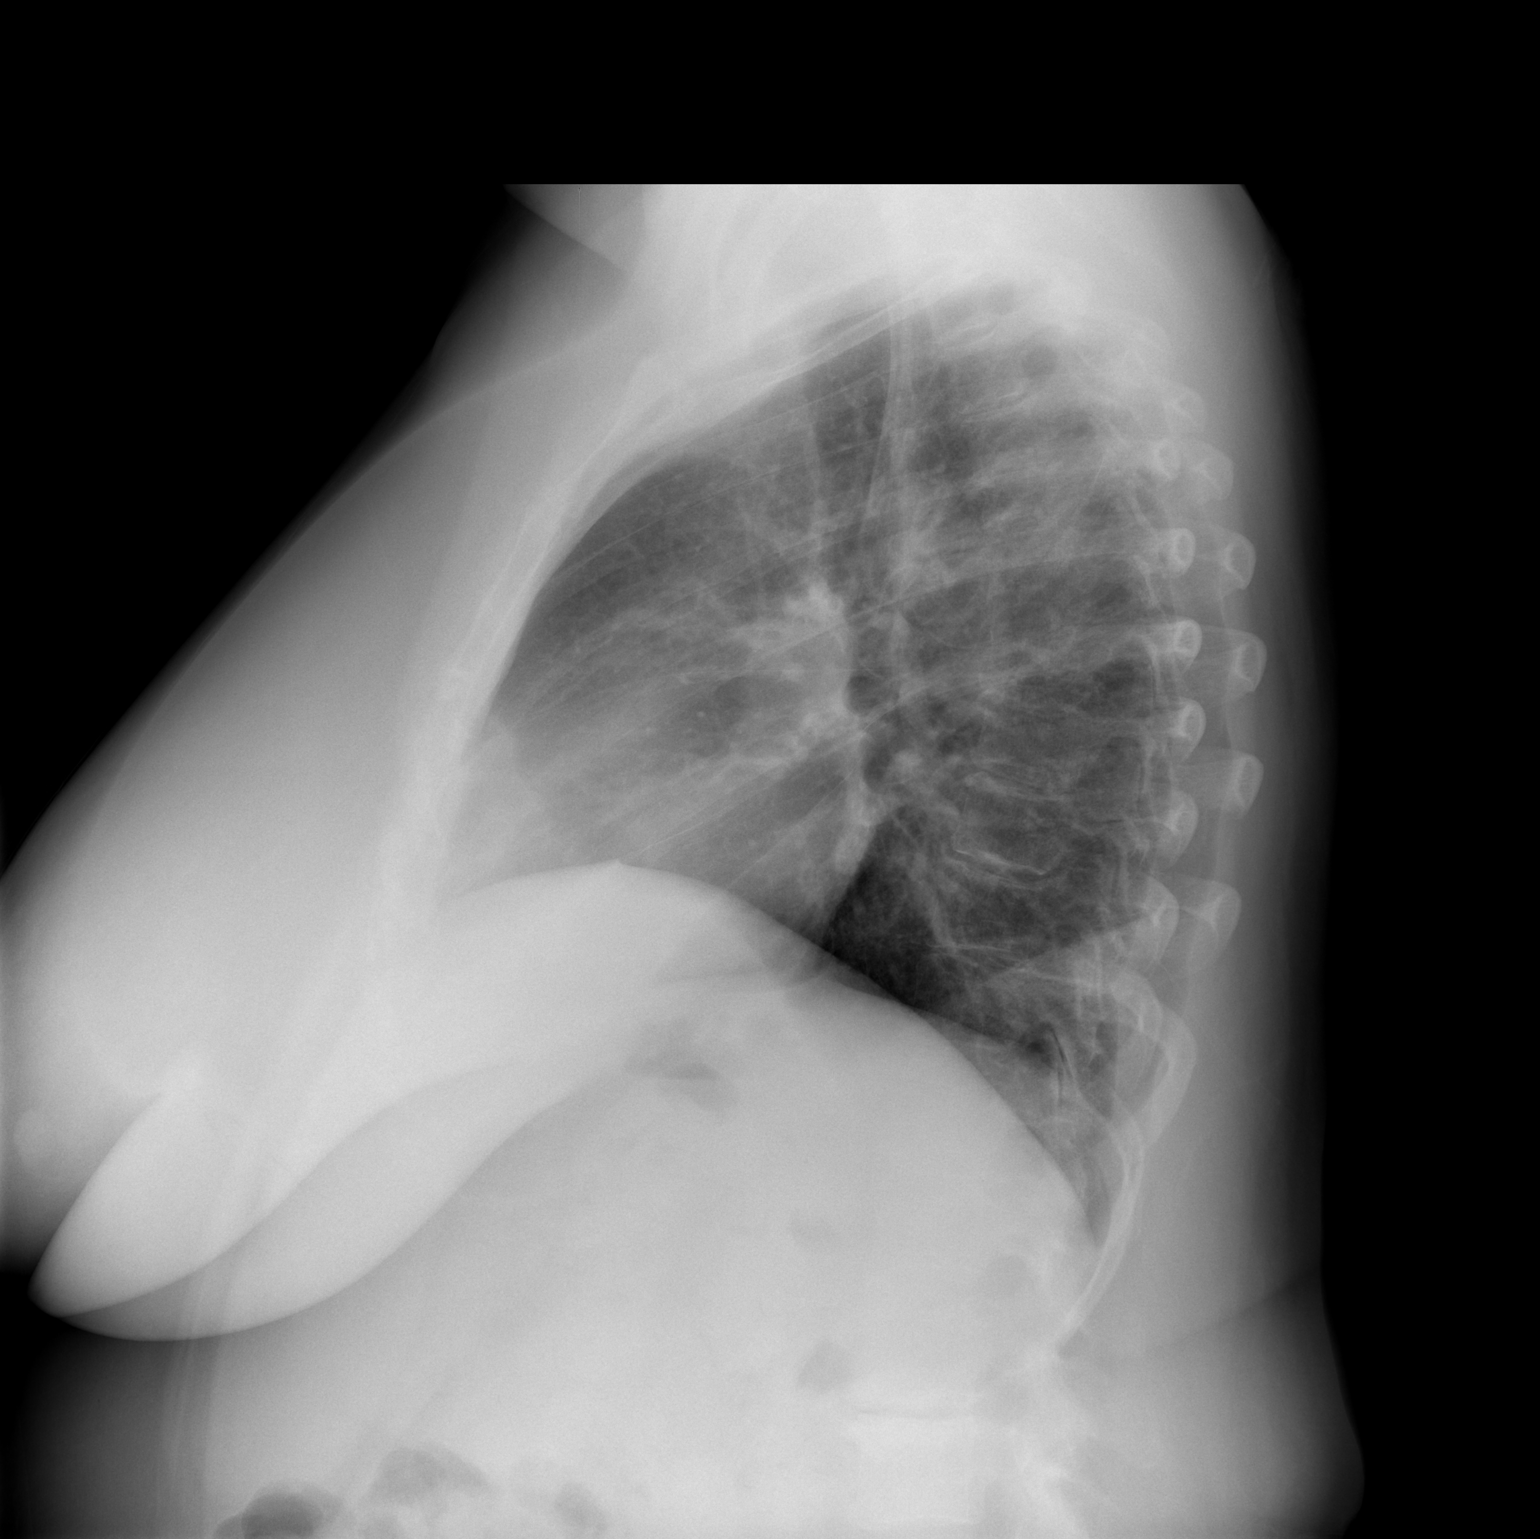

[2 of 2 positions shown; findings below may reference images not displayed]

FINDINGS: There is scoliosis convex to the right.  Heart size is normal.  There is chronic scarring in the lingula.  The vascularity is normal.  The lungs are otherwise clear.  No effusions.  No acute bony findings.
IMPRESSION: Scoliosis.  Lingular scarring.  No active process evident.

## 2009-01-13 ENCOUNTER — Encounter: Admission: RE | Admit: 2009-01-13 | Discharge: 2009-01-13 | Payer: Self-pay | Admitting: Obstetrics and Gynecology

## 2010-02-24 ENCOUNTER — Encounter: Admission: RE | Admit: 2010-02-24 | Discharge: 2010-02-24 | Payer: Self-pay | Admitting: Obstetrics and Gynecology

## 2011-03-11 NOTE — H&P (Signed)
NAMEMERRITT, KINGMA                ACCOUNT NO.:  [id number]   MEDICAL RECORD NO.:  [id number]          PATIENT TYPE:  AMB   LOCATION:  [facility]                           FACILITY:  [facility]   PHYSICIAN:  Eldred Manges, M.D.DATE OF BIRTH:  [birth year]   DATE OF ADMISSION:  04/28/2006  DATE OF DISCHARGE:                                HISTORY & PHYSICAL   HISTORY OF PRESENT ILLNESS:  Carolyn Russo is a 61 year old married African-  American female, para 1-0-1-1, who presents for hysteroscopy with D&C and  possible fibroid resection because of postmenopausal bleeding.  The patient  has a previous history of bleeding in 2005 after having been amenorrheic for  over a year and one-half.  An endometrial biopsy at that time, was within  normal limits.  Then in 2006, the patient developed a cyclical spotting  which lasted approximately 7 days with each episode.  A pelvic ultrasound  during that time showed a uterine size of 7.6 x 3.8 x 6.4 cm with 4  fibroids, the larges of which measured 3.5 cm.  Her endometrial stripe was  very thin on this study (4 mm), and both ovaries appeared within normal  limits.  It was also noted on this scan that patient's endometrium was  distorted by her fibroids.  At that time, a hysteroscopy was offered to the  patient; however, she declined, choosing instead to observe her bleeding  pattern.  Following this episode, the patient's bleeding subsided for 11  consecutive months until May 2007 when she began to bleed/spot several times  weekly requiring her to wear 2 pads daily.  Additionally, the patient  complained of achiness in her lower abdomen.  A pelvic ultrasound in June  2007 revealed a posterior fibroid measuring 1.83 x 1.45 x 1.53 cm and a  possible submucosal fibroid measuring 3.36 x 2.76 x 2.76 cm.  Given the  persistent nature of patient's irregular bleeding she has now considered to  proceed with hysteroscopy with D&C.   OB HISTORY:  Gravida 2, para  1-0-1-1.  The patient delivered by C section in  1985; she experienced gestational diabetes and pregnancy-induced  hypertension.   GYN HISTORY:  Menarche at 60 years old.  The patient's last menstrual  period: See History of Present Illness.  She uses condoms as method of  contraception, denies any history of abnormal Pap smear or sexually  transmitted diseases.  Last normal Pap smear was February 2007.   PAST MEDICAL HISTORY:  1.  Non-insulin-dependent diabetes.  2.  Asthma.  3.  Migraines.  4.  Gastroesophageal reflux disease.   FAMILY HISTORY:  Diabetes mellitus, hypertension, stroke, cancer (internal  melanoma).   PAST SURGICAL HISTORY:  1.  In 1959, right inguinal hernia repair.  2.  In 1965, appendectomy.  3.  In 1971, rhinoplasty.  4.  In 1979, myomectomy.  5.  In 1991, myomectomy.  6.  In 2002, diagnostic hysteroscopy with D&C (endometrial polyps).  7.  In 2003, right rotator cuff surgery.   Denies any problems with anesthesia or history of blood transfusion.   CURRENT MEDICATIONS:  1.  Fortamet ER 1000 mg daily.  2.  Albuterol inhaler as needed.  3.  Vitamin C twice weekly.  4.  B complex daily.   ALLERGIES:  The patient is allergic to TETRACYCLINE which causes her to  itch.   REVIEW OF SYSTEMS:  The patient does wear glasses.  She has a heart murmur  for which she has been advised to receive prophylactic antibiotics.  Abdominal pain for which she has had a complete GI workup with benign  findings.  She denies any chest pain, shortness of breath, fever, chills,  dyspareunia, nausea, vomiting, diarrhea, or vaginitis symptoms.  In  addition, all other Review of Systems except as mentioned in the History of  Present Illness are negative.   PHYSICAL EXAMINATION:  VITAL SIGNS:  Blood pressure 140/90.  Height is 5  feet 3-1/2 inches tall.  Weight is 187 pounds.  NECK:  Supple without masses.  There is no thyromegaly or cervical  adenopathy.  HEART: Regular rate  and rhythm.  There is no murmur.  LUNGS:  Clear to auscultation.  There are no wheezes, rales, or rhonchi.  BACK: No CVA tenderness.  ABDOMEN:  Soft without tenderness, guarding, rebound, or organomegaly.  EXTREMITIES:  Without clubbing, cyanosis, or edema.  PELVIC:  EG/BUS is within normal limits.  Vagina is rugose.  Cervix is  nontender without lesions. Uterus appears normal size, shape, and  consistency, though exam is limited by body habitus.  There is fundal  tenderness adnexa with no appreciable masses; however, it is tender  bilaterally.  Rectovaginal exam without tenderness or masses.   IMPRESSION:  1.  Postmenopausal bleeding.  2.  Uterine fibroids.   DISPOSITION:  A discussion was held with patient regarding the indications  for her procedures along with their risks which include but are not limited  to reaction from anesthesia, damage to adjacent organs, infection, and  excessive bleeding, as well as the specific risk of utetrine perforation.  The patient was given a copy of the ACOG brochure entitled, Hysteroscopy.  She has consented to proceed with hysteroscopy with D&C and the possibility  of resection of a submucosal fibroid at [place] on  April 28, 2006, at 10 o'clock a.m.      [place] J. Elizebeth Koller.      Eldred Manges, M.D.  Electronically Signed    EJP/MEDQ  D:  04/25/2006  T:  04/25/2006  Job:  [id number]

## 2011-03-11 NOTE — H&P (Signed)
Carolyn Russo  Patient:    Carolyn Russo, Carolyn Russo                         MRN: [id number] Attending:  Seymour Bars. Leo Grosser, M.D. Dictator:   Earnstine Regal, P.A.                         History and Physical  DATE OF BIRTH:                1950/11/20  HISTORY OF PRESENT ILLNESS:   [name] is a 59 year old married African-American female para 0-1-2-1 with a history of uterine fibroids presenting with heavy, frequent uterine bleeding of several months duration. In the past, the patient has experienced the same type of erratic menstrual-like flow intermittently; however, her endometrial biopsies of January 2000 and April 2001, along with thyroid panel of October 1999 and April 2001, were all normal.  The patients normal menses is 10 days in duration and marked by occasional cramping and the need to wear 2-3 large pads at a time to prevent soiling.  The patients pelvic ultrasound with sonogram (June 2002) reveals several uterine fibroids and a heterogeneity in the fundal aspect of the uterine stripe.  A somewhat hyperechoic imaging characteristic similar to the endometrium itself is seen on sonogram within the endometrium and is less consistent in appearance with an endometrial polyp.  Probable small amounts of clot are present in the endometrial cavity with multiple uterine fibroids.  Given the regularity and pedunculated appearance of the endometrial lesion, the consideration of an endometrial carcinoma is very unlikely.  The patient presents for hysteroscopy D&C to further evaluate her symptoms.  PAST OBSTETRICAL HISTORY:     Gravida 3, para 0-1-2-1; the patient experienced gestational diabetes and pregnancy-induced hypertension and underwent cesarean section.  PAST GYNECOLOGICAL HISTORY:   Menarche 60 years old.  Periods have always been irregular.  She uses condoms for contraception.  Denies any history of abnormal Pap smears or sexually-transmitted  diseases; mammogram (February 2002) within normal limits, Pap (June 2002) within normal limits.  PAST SURGICAL HISTORY:        Inguinal hernia repair 1959, appendectomy 1955, rhinoplasty 1971, myomectomy 1979, cesarean section 1985, myomectomy 1991. The patient also denies any history of blood transfusions.  PAST MEDICAL HISTORY:         Positive for diabetes mellitus, uterine fibroids, gestational hypertension, asthma, heart murmur (no prophylaxis required), and migraines.  FAMILY HISTORY:               Positive for diabetes, hypertension, stroke, ovarian melanoma.  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 Current habits:  She does not use alcohol or tobacco.  MEDICATIONS:                  1. Glucotrol XL 1 tablet daily.                               2. Proventil inhaler as needed.                               3. Asthmacort inhaler as needed.  4. Vitamin E 800 international units daily.                               5. B complex vitamins 1 tablet daily.                               6. Iron supplement 1 tablet daily.  REVIEW OF SYSTEMS:            Positive for mastodynia, otherwise as per history of present illness.  PHYSICAL EXAMINATION:  VITAL SIGNS:                  Blood pressure 122/66, height 5 feet 3-1/2 inches tall (the patient declined her weight).  HEENT:                        Within normal limits.  NECK:                         Thyroid is not enlarged.  HEART:                        Regular rate and rhythm.  There is no audible murmur on exam.  LUNGS:                        No wheezes, rales, or rhonchi.  ABDOMEN:                      Bowel sounds are present.  It is soft, nontender.  BACK:                         No CVA tenderness.  EXTREMITIES:                  No clubbing, cyanosis, or edema.  PELVIC:                        EGBUS is within normal limits.  Vagina is rugous.  Cervix is nontender without lesions.  Uterus difficult to assess secondary to body habitus; however, appears to be normal size, shape, and consistency and without tenderness.  Adnexa without tenderness or masses. Rectovaginal without tenderness or masses.  IMPRESSION:                   1. Abnormal uterine perimenopausal bleeding.                               2. Uterine fibroids.                               3. Non-insulin-dependent diabetes.  DISPOSITION:                  A discussion was held with the patient regarding the implications for her procedure, along with the risks involved to include but not limited to reaction to anesthesia, bleeding, infection, and damage to adjacent organs.  The patient has consented to undergo a hysteroscopy dilation and curettage at [facility] on May 24, 2001 at 12:45 p.m. DD:  05/16/01 TD:  05/16/01 Job: [id number]

## 2011-03-11 NOTE — Op Note (Signed)
Carolyn Russo, Carolyn Russo                ACCOUNT NO.:  [id number]   MEDICAL RECORD NO.:  [id number]          PATIENT TYPE:  AMB   LOCATION:  [facility]                           FACILITY:  [facility]   PHYSICIAN:  Eldred Manges, M.D.DATE OF BIRTH:  1951-05-06   DATE OF PROCEDURE:  04/28/2006  DATE OF DISCHARGE:                                 OPERATIVE REPORT   PREOPERATIVE DIAGNOSES:  Postmenopausal bleeding, uterine fibroids.   POSTOPERATIVE DIAGNOSES:  Postmenopausal bleeding, endometrial polyp,  uterine fibroid, vaginal abrasion.   PROCEDURE:  Diagnostic hysteroscopy, resection of fibroid, removal of  endometrial polyp, vaginal biopsy and uterine curettage.   SURGEON:  Eldred Manges, M.D.   ANESTHESIA:  General LMA.   SPECIMENS TO PATHOLOGY:  Vaginal biopsy, fibroid polyp, endometrial  curettings.   ESTIMATED BLOOD LOSS:  Less than 25 mL.   COMPLICATIONS:  None.   FINDINGS:  Uterus sounded to 7 cm.  At the time of hysteroscopy there was a  posterior endometrial polyp and a right anterior fundal submucosal fibroid.  The remainder of the endometrium appeared atrophic.  There was likewise an  abrasion in the right vaginal fornix from which bleeding could be seen.   PROCEDURE:  The patient is taken to the operating room after appropriate  identification and placed on the operating table.  After the attainment of  adequate general anesthesia she was placed in lithotomy position.  The  perineum and vagina were prepped with multiple layers of Techni-Care and a  red [name] catheter used to empty the bladder.  The perineum was draped as  a sterile field.  A Graves speculum was placed in the vagina and a  paracervical block achieved with a total of 10 mL of 2% Xylocaine in the 5  and 7 o'clock positions.  A single-tooth tenaculum was placed on the  anterior cervix.  The uterus was sounded.  The cervix was then dilated to #3  dilator to accommodate the diagnostic hysteroscope.  The  diagnostic  hysteroscope was then used to visualize the above-noted findings and  document those.  The operative hysteroscope was then used to allow for  resection of the anterior fibroid.  The endometrial polyp was removed with  Randall stone forceps.  Curettage of the endometrial cavity revealed minimal  tissue.  Repeat hysteroscopy documented removal of the above-noted lesions.  The area of vaginal operation was then biopsied with a biopsy forceps and  hemostasis achieved with figure-of-eight suture of 2-0 Vicryl.  All instruments were then removed from the vagina and the patient awakened  from general anesthesia and taken to the recovery room in satisfactory  condition having tolerated the procedure well with sponge and instrument  counts correct.  Specimens to pathology as noted above.      Eldred Manges, M.D.  Electronically Signed     VPH/MEDQ  D:  04/28/2006  T:  04/28/2006  Job:  [id number]   cc:   Jeanann Lewandowsky, M.D.  Fax: [phone]
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
## 2011-03-11 NOTE — Op Note (Signed)
[place]  Patient:    Carolyn Russo, Carolyn Russo                    MRN: [id number] Proc. Date: 05/24/01 Adm. Date:  [id number] Attending:  Kendall Flack [place]                           Operative Report  PREOPERATIVE DIAGNOSIS:       Abnormal uterine bleeding and uterine fibroids.  POSTOPERATIVE DIAGNOSIS:      Abnormal uterine bleeding and uterine fibroids                               plus question of endometrial polyp.  OPERATION:                    Diagnostic hysteroscopy, diagnostic D&C.  SURGEON:                      Vanessa P. Leo Grosser, M.D.  ANESTHESIA:                   Monitored anesthesia care and local.  ESTIMATED BLOOD LOSS:         Less than 50 cc.  COMPLICATIONS:                None.  FINDINGS:                     The uterus was enlarged to approximately 8-10 weeks size.  However, the cavity sounded to 8 cm.  There was a large submucosal uterine fibroid which occupied a large portion of the endometrial cavity.  Just cephalad to that fibroid was an apparent endometrial polyp.  No other lesions were noted on hysteroscopy.  DESCRIPTION OF PROCEDURE:     The patient was taken to the operating room after appropriate identification, and placed on the operating table.  After placement of equipment for monitored anesthesia care, she was placed in the lithotomy position.  The perineum and vagina were prepped with multiple layers of Betadine.  The perineum was draped as a sterile field.  A latex-free catheter was used to empty the bladder.  A Graves speculum was placed in the vagina and a single tooth tenaculum placed on the anterior cervix.  A paracervical block was achieved with a total of 10 cc of 2% Xylocaine.  The cervix was then dilated to a #27 dilator to accommodate a diagnostic hysteroscope.  The hysteroscope was then used to identify and document the above noted findings.  The hysteroscope was removed and the uterine  cavity curetted, and then the Randall stone forceps used to remove apparent polypoid tissue.  All instruments were then removed from the vagina as hemostasis was noted to be adequate, and the patient was taken from the operating room to the recovery room in satisfactory condition having tolerated the procedure well.  Sponge and instrument counts were correct.  A separate endocervical specimen had been obtained prior to dilating the cervix.  SPECIMENS TO PATHOLOGY:       Endocervical curettings and endometrial curettings.  DISPOSITION:                  The patient was taken to the recovery room in satisfactory condition. DD:  05/24/01 TD:  05/25/01 Job: [id number]

## 2011-07-18 ENCOUNTER — Other Ambulatory Visit: Payer: Self-pay | Admitting: Obstetrics and Gynecology

## 2011-07-18 DIAGNOSIS — Z1231 Encounter for screening mammogram for malignant neoplasm of breast: Secondary | ICD-10-CM

## 2011-07-26 ENCOUNTER — Ambulatory Visit
Admission: RE | Admit: 2011-07-26 | Discharge: 2011-07-26 | Disposition: A | Discharge status: Home / Self Care | Payer: BC Managed Care – PPO | Source: Ambulatory Visit | Attending: Obstetrics and Gynecology | Admitting: Obstetrics and Gynecology

## 2011-07-26 DIAGNOSIS — Z1231 Encounter for screening mammogram for malignant neoplasm of breast: Secondary | ICD-10-CM

## 2011-10-13 ENCOUNTER — Telehealth: Payer: Self-pay

## 2011-10-13 DIAGNOSIS — R5383 Other fatigue: Secondary | ICD-10-CM

## 2011-10-13 DIAGNOSIS — Z13220 Encounter for screening for lipoid disorders: Secondary | ICD-10-CM

## 2011-10-13 DIAGNOSIS — Z139 Encounter for screening, unspecified: Secondary | ICD-10-CM

## 2011-10-13 DIAGNOSIS — R5381 Other malaise: Secondary | ICD-10-CM

## 2011-10-13 DIAGNOSIS — E119 Type 2 diabetes mellitus without complications: Secondary | ICD-10-CM

## 2011-10-13 NOTE — Telephone Encounter (Signed)
Per Dr Moshe Cipro, patient has new pt appt on Jan 28. Will mail labs to patient and also refill Janumet so it will last until she comes in

## 2011-11-11 ENCOUNTER — Telehealth: Payer: Self-pay | Admitting: Family Medicine

## 2011-11-11 NOTE — Telephone Encounter (Signed)
remailed her lab order

## 2011-11-16 ENCOUNTER — Encounter: Payer: Self-pay | Admitting: Family Medicine

## 2011-11-18 ENCOUNTER — Encounter: Payer: Self-pay | Admitting: Family Medicine

## 2011-11-18 LAB — CBC WITH DIFFERENTIAL/PLATELET
Basophils Absolute: 0 10*3/uL (ref 0.0–0.1)
Basophils Relative: 0 % (ref 0–1)
Eosinophils Absolute: 0.1 10*3/uL (ref 0.0–0.7)
Eosinophils Relative: 1 % (ref 0–5)
HCT: 39.2 % (ref 36.0–46.0)
Hemoglobin: 13.6 g/dL (ref 12.0–15.0)
Lymphocytes Relative: 44 % (ref 12–46)
Lymphs Abs: 3.1 10*3/uL (ref 0.7–4.0)
MCH: 28.6 pg (ref 26.0–34.0)
MCHC: 34.7 g/dL (ref 30.0–36.0)
MCV: 82.5 fL (ref 78.0–100.0)
Monocytes Absolute: 0.5 10*3/uL (ref 0.1–1.0)
Monocytes Relative: 7 % (ref 3–12)
Neutro Abs: 3.3 10*3/uL (ref 1.7–7.7)
Neutrophils Relative %: 47 % (ref 43–77)
Platelets: 320 10*3/uL (ref 150–400)
RBC: 4.75 MIL/uL (ref 3.87–5.11)
RDW: 13.4 % (ref 11.5–15.5)
WBC: 7 10*3/uL (ref 4.0–10.5)

## 2011-11-18 LAB — COMPLETE METABOLIC PANEL WITH GFR
ALT: 24 U/L (ref 0–35)
AST: 15 U/L (ref 0–37)
Albumin: 4.4 g/dL (ref 3.5–5.2)
Alkaline Phosphatase: 62 U/L (ref 39–117)
BUN: 13 mg/dL (ref 6–23)
CO2: 24 mEq/L (ref 19–32)
Calcium: 9.7 mg/dL (ref 8.4–10.5)
Chloride: 98 mEq/L (ref 96–112)
Creat: 0.84 mg/dL (ref 0.50–1.10)
GFR, Est African American: 86 mL/min
GFR, Est Non African American: 75 mL/min
Glucose, Bld: 348 mg/dL — ABNORMAL HIGH (ref 70–99)
Potassium: 4.3 mEq/L (ref 3.5–5.3)
Sodium: 132 mEq/L — ABNORMAL LOW (ref 135–145)
Total Bilirubin: 0.9 mg/dL (ref 0.3–1.2)
Total Protein: 6.9 g/dL (ref 6.0–8.3)

## 2011-11-18 LAB — TSH: TSH: 1.353 u[IU]/mL (ref 0.350–4.500)

## 2011-11-18 LAB — LIPID PANEL
Cholesterol: 176 mg/dL (ref 0–200)
HDL: 37 mg/dL — ABNORMAL LOW (ref >39)
LDL Cholesterol: 111 mg/dL — ABNORMAL HIGH (ref 0–99)
Total CHOL/HDL Ratio: 4.8 Ratio
Triglycerides: 138 mg/dL (ref <150)
VLDL: 28 mg/dL (ref 0–40)

## 2011-11-18 LAB — VITAMIN D 25 HYDROXY (VIT D DEFICIENCY, FRACTURES): Vit D, 25-Hydroxy: 37 ng/mL (ref 30–89)

## 2011-11-18 LAB — HEMOGLOBIN A1C
Hgb A1c MFr Bld: 10.3 % — ABNORMAL HIGH (ref <5.7)
Mean Plasma Glucose: 249 mg/dL — ABNORMAL HIGH (ref <117)

## 2011-11-18 LAB — MICROALBUMIN / CREATININE URINE RATIO
Creatinine, Urine: 49.1 mg/dL
Microalb Creat Ratio: 28.7 mg/g (ref 0.0–30.0)
Microalb, Ur: 1.41 mg/dL (ref 0.00–1.89)

## 2011-11-21 ENCOUNTER — Ambulatory Visit (INDEPENDENT_AMBULATORY_CARE_PROVIDER_SITE_OTHER): Payer: BC Managed Care – PPO | Admitting: Family Medicine

## 2011-11-21 ENCOUNTER — Encounter: Payer: Self-pay | Admitting: Family Medicine

## 2011-11-21 VITALS — BP 160/90 | HR 86 | Resp 16 | Ht 63.0 in | Wt 190.1 lb

## 2011-11-21 DIAGNOSIS — I10 Essential (primary) hypertension: Secondary | ICD-10-CM | POA: Insufficient documentation

## 2011-11-21 DIAGNOSIS — E119 Type 2 diabetes mellitus without complications: Secondary | ICD-10-CM

## 2011-11-21 DIAGNOSIS — E669 Obesity, unspecified: Secondary | ICD-10-CM

## 2011-11-21 DIAGNOSIS — E785 Hyperlipidemia, unspecified: Secondary | ICD-10-CM

## 2011-11-21 DIAGNOSIS — R9431 Abnormal electrocardiogram [ECG] [EKG]: Secondary | ICD-10-CM | POA: Insufficient documentation

## 2011-11-21 MED ORDER — GLUCOSE BLOOD VI STRP: ORAL_STRIP | Status: AC

## 2011-11-21 MED ORDER — ONETOUCH DELICA LANCETS MISC: Status: DC

## 2011-11-21 MED ORDER — VALSARTAN 80 MG PO TABS: 80.0000 mg | ORAL_TABLET | Freq: Every day | ORAL | Status: DC

## 2011-11-21 NOTE — Patient Instructions (Addendum)
F/u in 6 weeks.  New medication to be taken, diovan, 80mg  one daily, your blood pressure is high.  Labs were overall good except blood sugar very high. I understand you were recently sick which will contribute to this.  Please commit to 30 minutes daily of exercise.  Please do go for diabetic teaching re healthy eating choices, diet is crucial to blood sugar control.  Goal for fasting blood sugar ranges from 80 to 120 and 2 hours after any meal or at bedtime should be between 130 to 170.  Call if number are high after 3 weeks.  Please test twice daily until you return  EKG today.You will be referred to cardiologist of your choice since EKG is not normal  You will get the shingles shot and TdaP at your next visit

## 2011-11-21 NOTE — Assessment & Plan Note (Addendum)
Uncontrolled, has been on med in the past, reportedly diovan, will start with lowest dose, but anticipate that this will need to be increased

## 2011-11-25 ENCOUNTER — Other Ambulatory Visit: Payer: Self-pay

## 2011-11-25 DIAGNOSIS — E669 Obesity, unspecified: Secondary | ICD-10-CM | POA: Insufficient documentation

## 2011-11-25 DIAGNOSIS — E785 Hyperlipidemia, unspecified: Secondary | ICD-10-CM | POA: Insufficient documentation

## 2011-11-25 MED ORDER — SITAGLIPTIN PHOS-METFORMIN HCL 50-1000 MG PO TABS: 1.0000 | ORAL_TABLET | Freq: Two times a day (BID) | ORAL | Status: DC

## 2011-11-25 NOTE — Progress Notes (Signed)
  Subjective:    Patient ID: Carolyn Russo, female    DOB: 04-02-1951, 61 y.o.   MRN: [id number]  HPI New pt evaluation, for uncontrolled diabetic pt with dyslipidemia,. [name] reports that she has been very ill in the past 4 weeks with intractable diareah and some vomiting. Both conditions have since improved to near normalization as of this past 3 to 4 days. She has not been compliant with her janumet because of severe GI distress, states she is just beginning to feel herself. She also needs to and is willing to get re educated as far as diet is concerned, and a referral will be made. She denies polyuria or polydipsia. Pt reports recent chest discomfort, no specific aggravating or relieving factors noted, no associated nausea, diaphoresis or radiation. She has multiple cardiovascular risk factors, and is therefore being referred for further evaluation.\Immunization is not current, she opts to have this at next visit , when she feels better. Cancer screening info is being obtained from previous MD Pt is not comited to any regular physica lactivity, understands the need to address this and promises to do so   Review of Systems See HPI Denies recent fever or chills. Denies sinus pressure, nasal congestion, ear pain or sore throat. Denies chest congestion, productive cough or wheezing. Denies  palpitations and leg swelling   Denies dysuria, frequency, hesitancy or incontinence. Denies joint pain, swelling and limitation in mobility. Denies headaches, seizures, numbness, or tingling. Denies depression, anxiety or insomnia. Denies skin break down or rash.        Objective:   Physical Exam Patient alert and oriented and in no cardiopulmonary distress.  HEENT: No facial asymmetry, EOMI, no sinus tenderness,  oropharynx pink and moist.  Neck supple no adenopathy.  Chest: Clear to auscultation bilaterally.  CVS: S1, S2 no murmurs, no S3.  ABD: Soft non tender. Bowel sounds  hyperactive  Ext: No edema  MS: Adequate ROM spine, shoulders, hips and knees.  Skin: Intact, no ulcerations or rash noted.  Psych: Good eye contact, normal affect. Memory intact not anxious or depressed appearing.  CNS: CN 2-12 intact, power, tone and sensation normal throughout.       Assessment & Plan:

## 2011-11-25 NOTE — Assessment & Plan Note (Signed)
Reports recent chest discomfort, pt is at increased risk for heart disease due to uncontrolled DM, as well as elevated lDL. States she is intolerant of statins, she is being referred for cardia eval

## 2011-11-25 NOTE — Assessment & Plan Note (Signed)
Low fat diet discussed and encouraged.  Pt reports statin intolerance, will hold on attempting to prescribe despite the clear indications of DM and dyslipidemia

## 2011-11-25 NOTE — Assessment & Plan Note (Signed)
Commitment to regular exercise and dietary control discussed and encouraged Weight loss goal of 2 to 4 pounds per month is reasonable

## 2011-11-25 NOTE — Assessment & Plan Note (Addendum)
Uncontrolled, has been sick for 1 month with stomach virus and has not been taking med regularly as a result. Is willing and needs to see nutritionist also to practice appropriate diet to ensure improved numbers She is being referred to a nutritionist , and needs to return in 6 weeks with her log. She is to call in 3 weeks if blood sugars are still elevated

## 2011-12-13 ENCOUNTER — Encounter: Payer: BC Managed Care – PPO | Attending: Family Medicine | Admitting: *Deleted

## 2011-12-13 ENCOUNTER — Encounter: Payer: Self-pay | Admitting: *Deleted

## 2011-12-13 DIAGNOSIS — Z713 Dietary counseling and surveillance: Secondary | ICD-10-CM | POA: Insufficient documentation

## 2011-12-13 DIAGNOSIS — E119 Type 2 diabetes mellitus without complications: Secondary | ICD-10-CM | POA: Insufficient documentation

## 2011-12-13 NOTE — Progress Notes (Signed)
Medical Nutrition Therapy:  Appt start time: 1500 end time:  1600.  Assessment:  T2DM.  Pt with 20 h/o T2DM here with A1c of 10.3% for assessment. Check BGs 1-2x/day and meter data shows a mean FBG of ~320 mg, pre-meal of 330 mg, and post-meal of 315 mg. 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 Believes her A1c is d/t recent 5 week stomach illness and stress from death of aunt.  Reports eating 3-4 popscicles/day during illness. Food recall reveals increased fast food on recent 2 wk vacation. Pt also consuming SF/FF ice cream, some desserts, and diet soda. No exercise noted, though pt states she plans to start Monday. No pain reported at this time.    MEDICATIONS: See medication list.    DIETARY INTAKE:  Usual eating pattern includes 2-3 meals and 1-2 snacks per day.  24-hr recall:  B ( AM): Oatmeal and broiled chicken wing OR egg   Snk ( AM):  None L ( PM): 2 baked chicken wings Snk ( PM): Walnuts or nothing D ( PM): 3 oz sweet potato (plain), broccoli or greens, fried fish, brown rice (1-2 cups) or cornbread Snk ( PM): Lg apple (30g) Beverages: water, diet soda (cutting down)  Usual physical activity: None. Plans to begin exercise regimen on Monday  Estimated energy needs: 1400 calories 155-160 g carbohydrates 105 g protein 40 g fat  Progress Towards Goal(s):  In progress.   Nutritional Diagnosis:  [place]-2.1 Impaired nutrient utilization related to glucose metabolism as evidenced by patient-reported intake of refined CHO and a recent A1c of 10.3%.    Intervention/Goals:  Follow Diabetes Meal Plan as instructed (yellow card).  Avoid meal skipping.   Add lean protein foods to add meals/snacks.  Aim for 25-30 g of fiber daily.  Limit sweets and fried foods.  Measure portions of carbs at meals; eat consistent carbs through the day.  Aim for 30-60 mins of physical activity most days.  Bring glucose log to  your next nutrition visit.  Handouts given during visit include:  Snack list  30g/45g CHO menus  Monitoring/Evaluation:  Dietary intake, exercise, A1c (as available) BG trends, and body weight in 6 week(s).

## 2011-12-13 NOTE — Patient Instructions (Addendum)
Goals:  Follow Diabetes Meal Plan as instructed (yellow card).  Avoid meal skipping.   Add lean protein foods to add meals/snacks.  Aim for 25-30 g of fiber daily.  Limit sweets and fried foods.  Measure portions of carbs at meals; eat consistent carbs through the day.  Aim for 30-60 mins of physical activity most days.  Bring glucose log to your next nutrition visit

## 2011-12-15 ENCOUNTER — Encounter: Payer: Self-pay | Admitting: *Deleted

## 2012-01-02 ENCOUNTER — Encounter: Payer: Self-pay | Admitting: Family Medicine

## 2012-01-02 ENCOUNTER — Ambulatory Visit (INDEPENDENT_AMBULATORY_CARE_PROVIDER_SITE_OTHER): Payer: BC Managed Care – PPO | Admitting: Family Medicine

## 2012-01-02 VITALS — BP 160/100 | HR 77 | Resp 16 | Ht 63.0 in | Wt 193.0 lb

## 2012-01-02 DIAGNOSIS — E669 Obesity, unspecified: Secondary | ICD-10-CM

## 2012-01-02 DIAGNOSIS — I10 Essential (primary) hypertension: Secondary | ICD-10-CM

## 2012-01-02 DIAGNOSIS — E119 Type 2 diabetes mellitus without complications: Secondary | ICD-10-CM

## 2012-01-02 MED ORDER — VALSARTAN-HYDROCHLOROTHIAZIDE 160-12.5 MG PO TABS: 1.0000 | ORAL_TABLET | Freq: Every day | ORAL | Status: DC

## 2012-01-02 NOTE — Patient Instructions (Addendum)
F/U in 6 weeks.  Blood pressure is still too high. Dose increase with change in medication for your blood pressure You are referred for eye exam .   You are referred to a diabetic specialist  It is important that you exercise regularly at least 30 minutes 5 times a week. If you develop chest pain, have severe difficulty breathing, or feel very tired, stop exercising immediately and seek medical attention  A healthy diet is rich in fruit, vegetables and whole grains. Poultry fish, nuts and beans are a healthy choice for protein rather then red meat. A low sodium diet and drinking 64 ounces of water daily is generally recommended. Oils and sweet should be limited. Carbohydrates especially for those who are diabetic or overweight, should be limited to 30-45 gram per meal. It is important to eat on a regular schedule, at least 3 times daily. Snacks should be primarily fruits, vegetables or nuts.  Goal for fasting blood sugar ranges from 80 to 120 and 2 hours after any meal or at bedtime should be between 130 to 170.

## 2012-01-09 NOTE — Assessment & Plan Note (Signed)
Uncontrolled, dose increase effective today on the diovan

## 2012-01-09 NOTE — Assessment & Plan Note (Signed)
Unchanged. Patient re-educated about  the importance of commitment to a  minimum of 150 minutes of exercise per week. The importance of healthy food choices with portion control discussed. Encouraged to start a food diary, count calories and to consider  joining a support group. Sample diet sheets offered. Goals set by the patient for the next several months.    

## 2012-01-09 NOTE — Progress Notes (Signed)
  Subjective:    Patient ID: Carolyn Russo, female    DOB: 30-Dec-1950, 61 y.o.   MRN: [id number]  HPI The PT is here for follow up and re-evaluation of chronic medical conditions, medication management and review of any available recent lab and radiology data.  Preventive health is updated, specifically  Cancer screening and Immunization.   Questions or concerns regarding consultations or procedures which the PT has had in the interim are  Addressed.Pt has been evaluated by cardiology, only concern is re her blood pressure, no new concern re EKG or need for further eval for cAD. Pt's blood sugars are very high still, she still does not "want " insulin. She needs to be followed by endo to normalize blood sugars The PT denies any adverse reactions to current medications since the last visit.   There are no specific complaints       Review of Systems See HPI Denies recent fever or chills. Denies sinus pressure, nasal congestion, ear pain or sore throat. Denies chest congestion, productive cough or wheezing. Denies chest pains, palpitations and leg swelling Denies abdominal pain, nausea, vomiting,diarrhea or constipation.   Denies dysuria, frequency, hesitancy or incontinence. Denies joint pain, swelling and limitation in mobility. Denies headaches, seizures, numbness, or tingling. Denies depression, anxiety or insomnia. Denies skin break down or rash.        Objective:   Physical Exam  Patient alert and oriented and in no cardiopulmonary distress.  HEENT: No facial asymmetry, EOMI, no sinus tenderness,  oropharynx pink and moist.  Neck supple no adenopathy.  Chest: Clear to auscultation bilaterally.  CVS: S1, S2 no murmurs, no S3.  ABD: Soft non tender. Bowel sounds normal.  Ext: No edema  MS: Adequate ROM spine, shoulders, hips and knees.  Skin: Intact, no ulcerations or rash noted.  Psych: Good eye contact, normal affect. Memory intact not anxious or depressed  appearing.  CNS: CN 2-12 intact, power, tone and sensation normal throughout. Diabetic Foot Check:  Appearance - no lesions, ulcers or calluses Skin - no unusual pallor or redness Sensation - grossly intact to light touch Monofilament testing -  Right - Great toe, medial, central, lateral ball and posterior foot intact Left - Great toe, medial, central, lateral ball and posterior foot intact Pulses Left - Dorsalis Pedis and Posterior Tibia normal Right - Dorsalis Pedis and Posterior Tibia normal       Assessment & Plan:

## 2012-01-09 NOTE — Assessment & Plan Note (Signed)
Uncontrolled , pt to be referred to endo, of her choice, I believe that she needs insulin, but still resists this thought, is willing to go to endo in [place]

## 2012-02-13 ENCOUNTER — Ambulatory Visit: Payer: BC Managed Care – PPO | Admitting: Family Medicine

## 2012-02-21 ENCOUNTER — Telehealth: Payer: Self-pay | Admitting: Family Medicine

## 2012-02-21 NOTE — Telephone Encounter (Signed)
pls let her know I have not received them

## 2012-02-21 NOTE — Telephone Encounter (Signed)
Called pt and gave her message that no labs was received

## 2012-02-21 NOTE — Telephone Encounter (Signed)
forwarded

## 2012-03-06 ENCOUNTER — Ambulatory Visit: Payer: BC Managed Care – PPO | Admitting: Family Medicine

## 2012-04-13 ENCOUNTER — Telehealth: Payer: Self-pay | Admitting: Family Medicine

## 2012-04-13 DIAGNOSIS — E119 Type 2 diabetes mellitus without complications: Secondary | ICD-10-CM

## 2012-04-13 DIAGNOSIS — I10 Essential (primary) hypertension: Secondary | ICD-10-CM

## 2012-04-13 NOTE — Addendum Note (Signed)
Addended by: Eual Fines on: 04/13/2012 12:48 PM   Modules accepted: Orders

## 2012-04-13 NOTE — Telephone Encounter (Signed)
pls order HBA1C and chem 7 for this patient to be done none fasting August 15 or after at a [facility] lab.  Call her at [phone], and fax the lab order up to her pls fax to [phone] She needs an appt scheduled 8/22 or after.

## 2012-04-13 NOTE — Telephone Encounter (Signed)
Patient notified that lab was being faxed over

## 2012-08-20 ENCOUNTER — Telehealth: Payer: Self-pay | Admitting: Family Medicine

## 2012-08-20 DIAGNOSIS — I10 Essential (primary) hypertension: Secondary | ICD-10-CM

## 2012-08-20 DIAGNOSIS — E119 Type 2 diabetes mellitus without complications: Secondary | ICD-10-CM

## 2012-08-20 NOTE — Addendum Note (Signed)
Addended by: Denman George B on: 08/20/2012 10:45 AM   Modules accepted: Orders

## 2012-08-20 NOTE — Telephone Encounter (Signed)
Pls order chem 7 EGFR, hBA1C , non fasting,and fax to [phone], pls verify with Dr Lysle Dingwall office that this is the correct fax # this is the number she gave me

## 2012-08-20 NOTE — Telephone Encounter (Signed)
Confirmed fax and sent lab order.

## 2012-10-05 ENCOUNTER — Other Ambulatory Visit: Payer: Self-pay | Admitting: Obstetrics and Gynecology

## 2012-10-05 DIAGNOSIS — Z1231 Encounter for screening mammogram for malignant neoplasm of breast: Secondary | ICD-10-CM

## 2012-10-24 DIAGNOSIS — J45909 Unspecified asthma, uncomplicated: Secondary | ICD-10-CM

## 2012-10-24 DIAGNOSIS — E860 Dehydration: Secondary | ICD-10-CM

## 2012-10-24 DIAGNOSIS — I10 Essential (primary) hypertension: Secondary | ICD-10-CM

## 2012-10-24 DIAGNOSIS — R0600 Dyspnea, unspecified: Secondary | ICD-10-CM

## 2012-10-24 DIAGNOSIS — R5383 Other fatigue: Secondary | ICD-10-CM

## 2012-10-24 DIAGNOSIS — R5381 Other malaise: Secondary | ICD-10-CM

## 2012-10-24 HISTORY — DX: Unspecified asthma, uncomplicated: J45.909

## 2012-10-24 HISTORY — DX: Essential (primary) hypertension: I10

## 2012-10-24 HISTORY — DX: Other fatigue: R53.83

## 2012-10-24 HISTORY — DX: Dyspnea, unspecified: R06.00

## 2012-10-24 HISTORY — DX: Other malaise: R53.81

## 2012-10-24 HISTORY — DX: Dehydration: E86.0

## 2012-10-30 ENCOUNTER — Ambulatory Visit
Admission: RE | Admit: 2012-10-30 | Discharge: 2012-10-30 | Disposition: A | Discharge status: Home / Self Care | Payer: BC Managed Care – PPO | Source: Ambulatory Visit | Attending: Obstetrics and Gynecology | Admitting: Obstetrics and Gynecology

## 2012-10-30 DIAGNOSIS — Z1231 Encounter for screening mammogram for malignant neoplasm of breast: Secondary | ICD-10-CM

## 2012-11-27 ENCOUNTER — Encounter: Payer: Self-pay | Admitting: Obstetrics and Gynecology

## 2012-11-27 ENCOUNTER — Ambulatory Visit: Payer: BC Managed Care – PPO | Admitting: Obstetrics and Gynecology

## 2012-11-27 VITALS — BP 110/60 | HR 72 | Resp 16 | Ht 62.0 in | Wt 187.0 lb

## 2012-11-27 DIAGNOSIS — E669 Obesity, unspecified: Secondary | ICD-10-CM

## 2012-11-27 DIAGNOSIS — D259 Leiomyoma of uterus, unspecified: Secondary | ICD-10-CM | POA: Insufficient documentation

## 2012-11-27 DIAGNOSIS — D219 Benign neoplasm of connective and other soft tissue, unspecified: Secondary | ICD-10-CM

## 2012-11-27 NOTE — Progress Notes (Signed)
Last Pap: 09/01/2011 WNL: Yes Regular Periods:no/Potrmenopusal Contraception: Postmenopausal Monthly Breast exam:yes Tetanus<29yrs:yes Nl.Bladder Function:yes Daily BMs:yes Healthy Diet:yes Calcium:yes Mammogram:yes Date of Mammogram:1/ 2014 WNL Exercise:no Have often Exercise: 2 x wkly Seatbelt: yes Abuse at home: no Stressful work:yes Sigmoid-colonoscopy: 5-73yrs ago Bone Density: Yes  PCP: Tula Nakayama Change in PMH: None Change in [id number]  Subjective:    Carolyn Russo is a 62 y.o. female G2P1 who presents for annual exam.  The patient has no complaints today.   The following portions of the patient's history were reviewed and updated as appropriate: allergies, current medications, past family history, past medical history, past social history, past surgical history and problem list.  Review of Systems Pertinent items are noted in HPI. Gastrointestinal:No change in bowel habits, no abdominal pain, no rectal bleeding Genitourinary:negative for dysuria, frequency, hematuria, nocturia and urinary incontinence    Objective:     There were no vitals taken for this visit.  Weight:  Wt Readings from Last 1 Encounters:  01/02/12 193 lb (87.544 kg)     BMI: There is no height or weight on file to calculate BMI. General Appearance: Alert, appropriate appearance for age. No acute distress HEENT: Grossly normal Neck / Thyroid: Supple, no masses, nodes or enlargement Lungs: clear to auscultation bilaterally Back: No CVA tenderness Breast Exam: No masses or nodes.No dimpling, nipple retraction or discharge. Cardiovascular: Regular rate and rhythm. S1, S2, no murmur Gastrointestinal: Soft, non-tender, no masses or organomegaly Pelvic Exam: Vulva and vagina appear normal. Bimanual exam reveals normal uterus and adnexa. Exam limited by body habitus Rectovaginal: normal rectal, no masses Lymphatic Exam: Non-palpable nodes in neck, clavicular, axillary, or inguinal regions  Skin: no rash or abnormalities Neurologic: Normal gait and speech, no tremor  Psychiatric: Alert and oriented, appropriate affect.    Urinalysis:Not done    Assessment:    Hx fibroids, always asymptomatic  TYPE II DM   Plan:   mammogram pap smear due 2015 return annually or prn    Kendall Flack MD

## 2013-02-08 ENCOUNTER — Emergency Department (HOSPITAL_COMMUNITY)
Admission: EM | Admit: 2013-02-08 | Discharge: 2013-02-08 | Disposition: A | Discharge status: Home / Self Care | Payer: BC Managed Care – PPO | Attending: Emergency Medicine | Admitting: Emergency Medicine

## 2013-02-08 ENCOUNTER — Encounter (HOSPITAL_COMMUNITY): Payer: Self-pay | Admitting: Emergency Medicine

## 2013-02-08 ENCOUNTER — Emergency Department (HOSPITAL_COMMUNITY): Payer: BC Managed Care – PPO

## 2013-02-08 DIAGNOSIS — R0982 Postnasal drip: Secondary | ICD-10-CM | POA: Insufficient documentation

## 2013-02-08 DIAGNOSIS — R059 Cough, unspecified: Secondary | ICD-10-CM | POA: Insufficient documentation

## 2013-02-08 DIAGNOSIS — Z7982 Long term (current) use of aspirin: Secondary | ICD-10-CM | POA: Insufficient documentation

## 2013-02-08 DIAGNOSIS — J3489 Other specified disorders of nose and nasal sinuses: Secondary | ICD-10-CM | POA: Insufficient documentation

## 2013-02-08 DIAGNOSIS — IMO0002 Reserved for concepts with insufficient information to code with codable children: Secondary | ICD-10-CM | POA: Insufficient documentation

## 2013-02-08 DIAGNOSIS — J40 Bronchitis, not specified as acute or chronic: Secondary | ICD-10-CM

## 2013-02-08 DIAGNOSIS — R05 Cough: Secondary | ICD-10-CM | POA: Insufficient documentation

## 2013-02-08 DIAGNOSIS — E119 Type 2 diabetes mellitus without complications: Secondary | ICD-10-CM | POA: Insufficient documentation

## 2013-02-08 DIAGNOSIS — J45901 Unspecified asthma with (acute) exacerbation: Secondary | ICD-10-CM | POA: Insufficient documentation

## 2013-02-08 DIAGNOSIS — Z8742 Personal history of other diseases of the female genital tract: Secondary | ICD-10-CM | POA: Insufficient documentation

## 2013-02-08 DIAGNOSIS — Z8709 Personal history of other diseases of the respiratory system: Secondary | ICD-10-CM | POA: Insufficient documentation

## 2013-02-08 DIAGNOSIS — Z79899 Other long term (current) drug therapy: Secondary | ICD-10-CM | POA: Insufficient documentation

## 2013-02-08 DIAGNOSIS — Z8632 Personal history of gestational diabetes: Secondary | ICD-10-CM | POA: Insufficient documentation

## 2013-02-08 LAB — CBC WITH DIFFERENTIAL/PLATELET
Basophils Absolute: 0 10*3/uL (ref 0.0–0.1)
Basophils Relative: 0 % (ref 0–1)
Eosinophils Absolute: 0.1 10*3/uL (ref 0.0–0.7)
Eosinophils Relative: 1 % (ref 0–5)
HCT: 34.9 % — ABNORMAL LOW (ref 36.0–46.0)
Hemoglobin: 12.8 g/dL (ref 12.0–15.0)
Lymphocytes Relative: 25 % (ref 12–46)
Lymphs Abs: 2.2 10*3/uL (ref 0.7–4.0)
MCH: 29.1 pg (ref 26.0–34.0)
MCHC: 36.7 g/dL — ABNORMAL HIGH (ref 30.0–36.0)
MCV: 79.3 fL (ref 78.0–100.0)
Monocytes Absolute: 0.7 10*3/uL (ref 0.1–1.0)
Monocytes Relative: 9 % (ref 3–12)
Neutro Abs: 5.6 10*3/uL (ref 1.7–7.7)
Neutrophils Relative %: 65 % (ref 43–77)
Platelets: 250 10*3/uL (ref 150–400)
RBC: 4.4 MIL/uL (ref 3.87–5.11)
RDW: 13.2 % (ref 11.5–15.5)
WBC: 8.6 10*3/uL (ref 4.0–10.5)

## 2013-02-08 LAB — BASIC METABOLIC PANEL
BUN: 9 mg/dL (ref 6–23)
CO2: 25 mEq/L (ref 19–32)
Calcium: 9.8 mg/dL (ref 8.4–10.5)
Chloride: 97 mEq/L (ref 96–112)
Creatinine, Ser: 0.62 mg/dL (ref 0.50–1.10)
GFR calc Af Amer: >90 mL/min (ref >90)
GFR calc non Af Amer: >90 mL/min (ref >90)
Glucose, Bld: 337 mg/dL — ABNORMAL HIGH (ref 70–99)
Potassium: 3.9 mEq/L (ref 3.5–5.1)
Sodium: 134 mEq/L — ABNORMAL LOW (ref 135–145)

## 2013-02-08 IMAGING — CR DG CHEST 2V
2 series · 2 of 2 positions shown · non-contrast
Comparison: Chest x-ray of [DATE]

CLINICAL DATA: Short of breath, wheezing, cough

CHEST - 2 VIEW

[w chest pa]
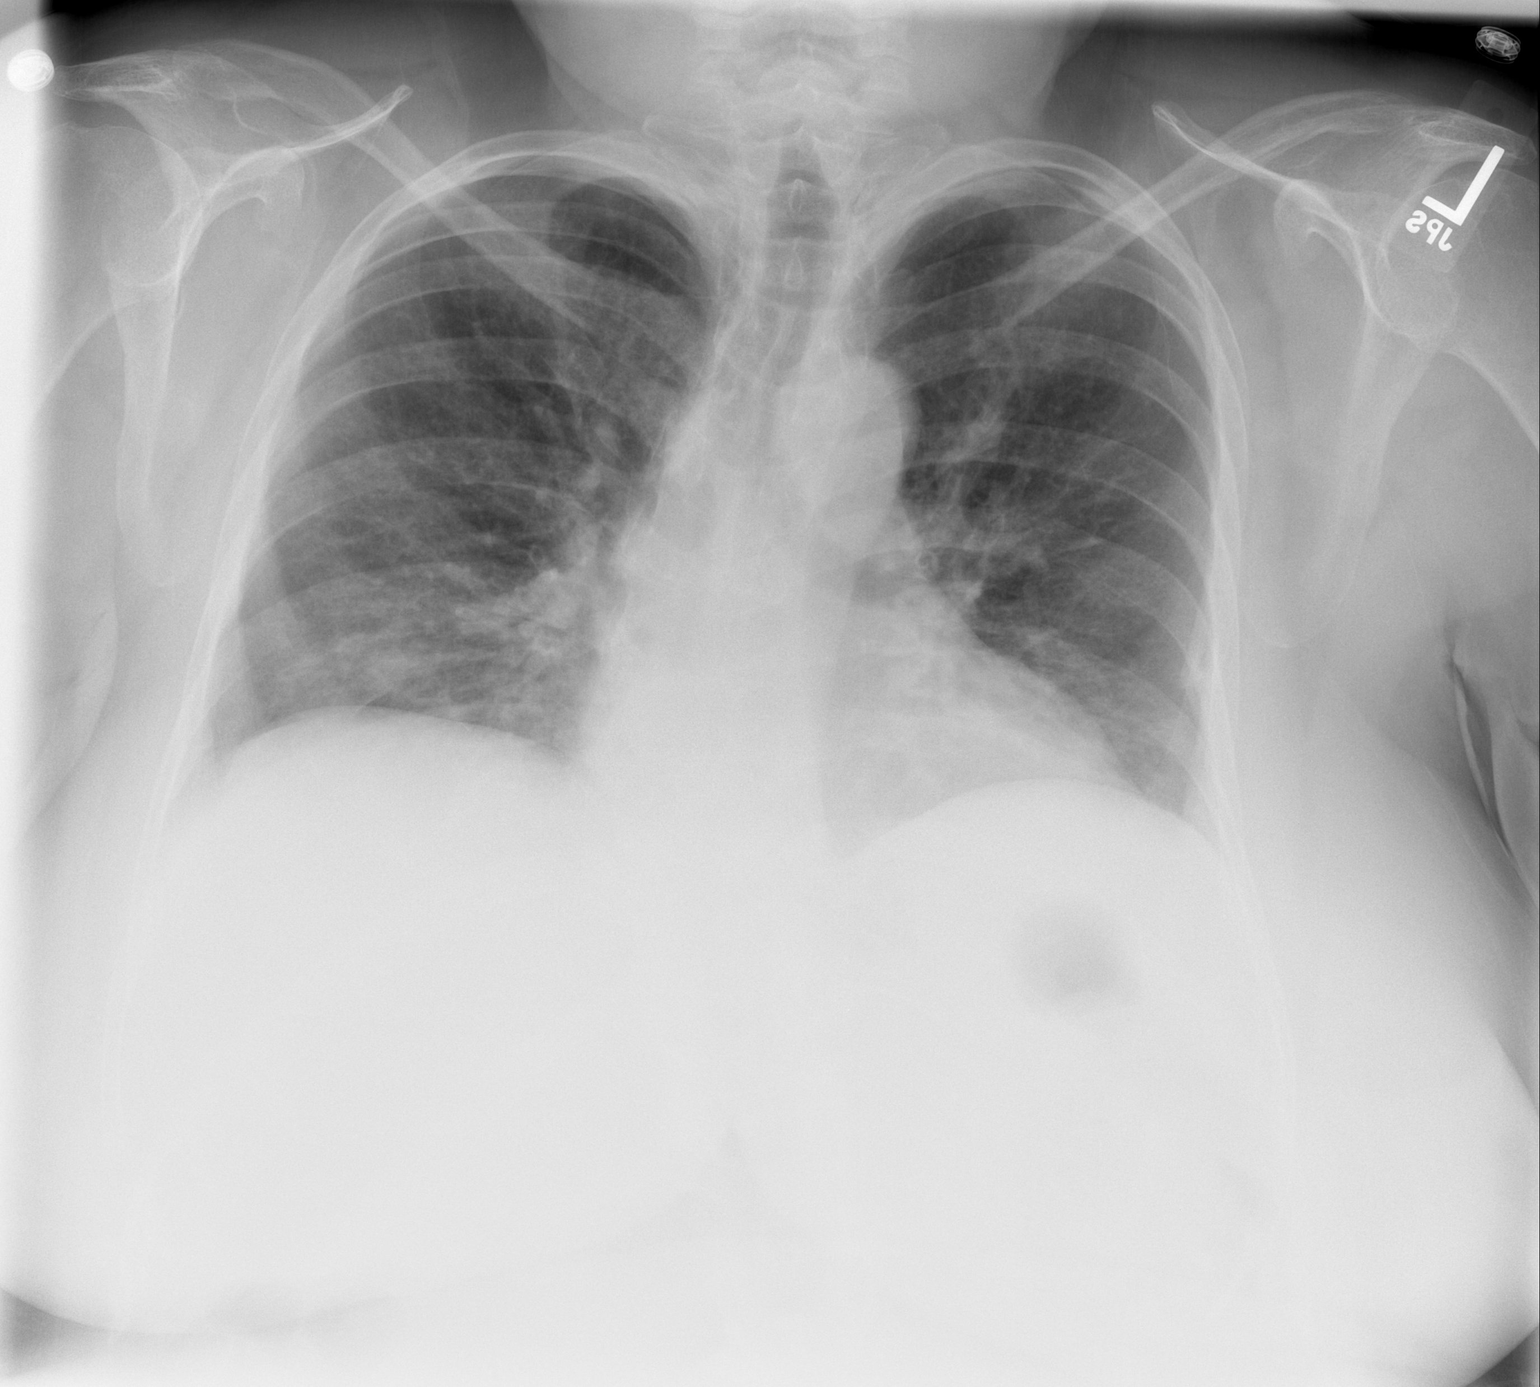

[w chest lat]
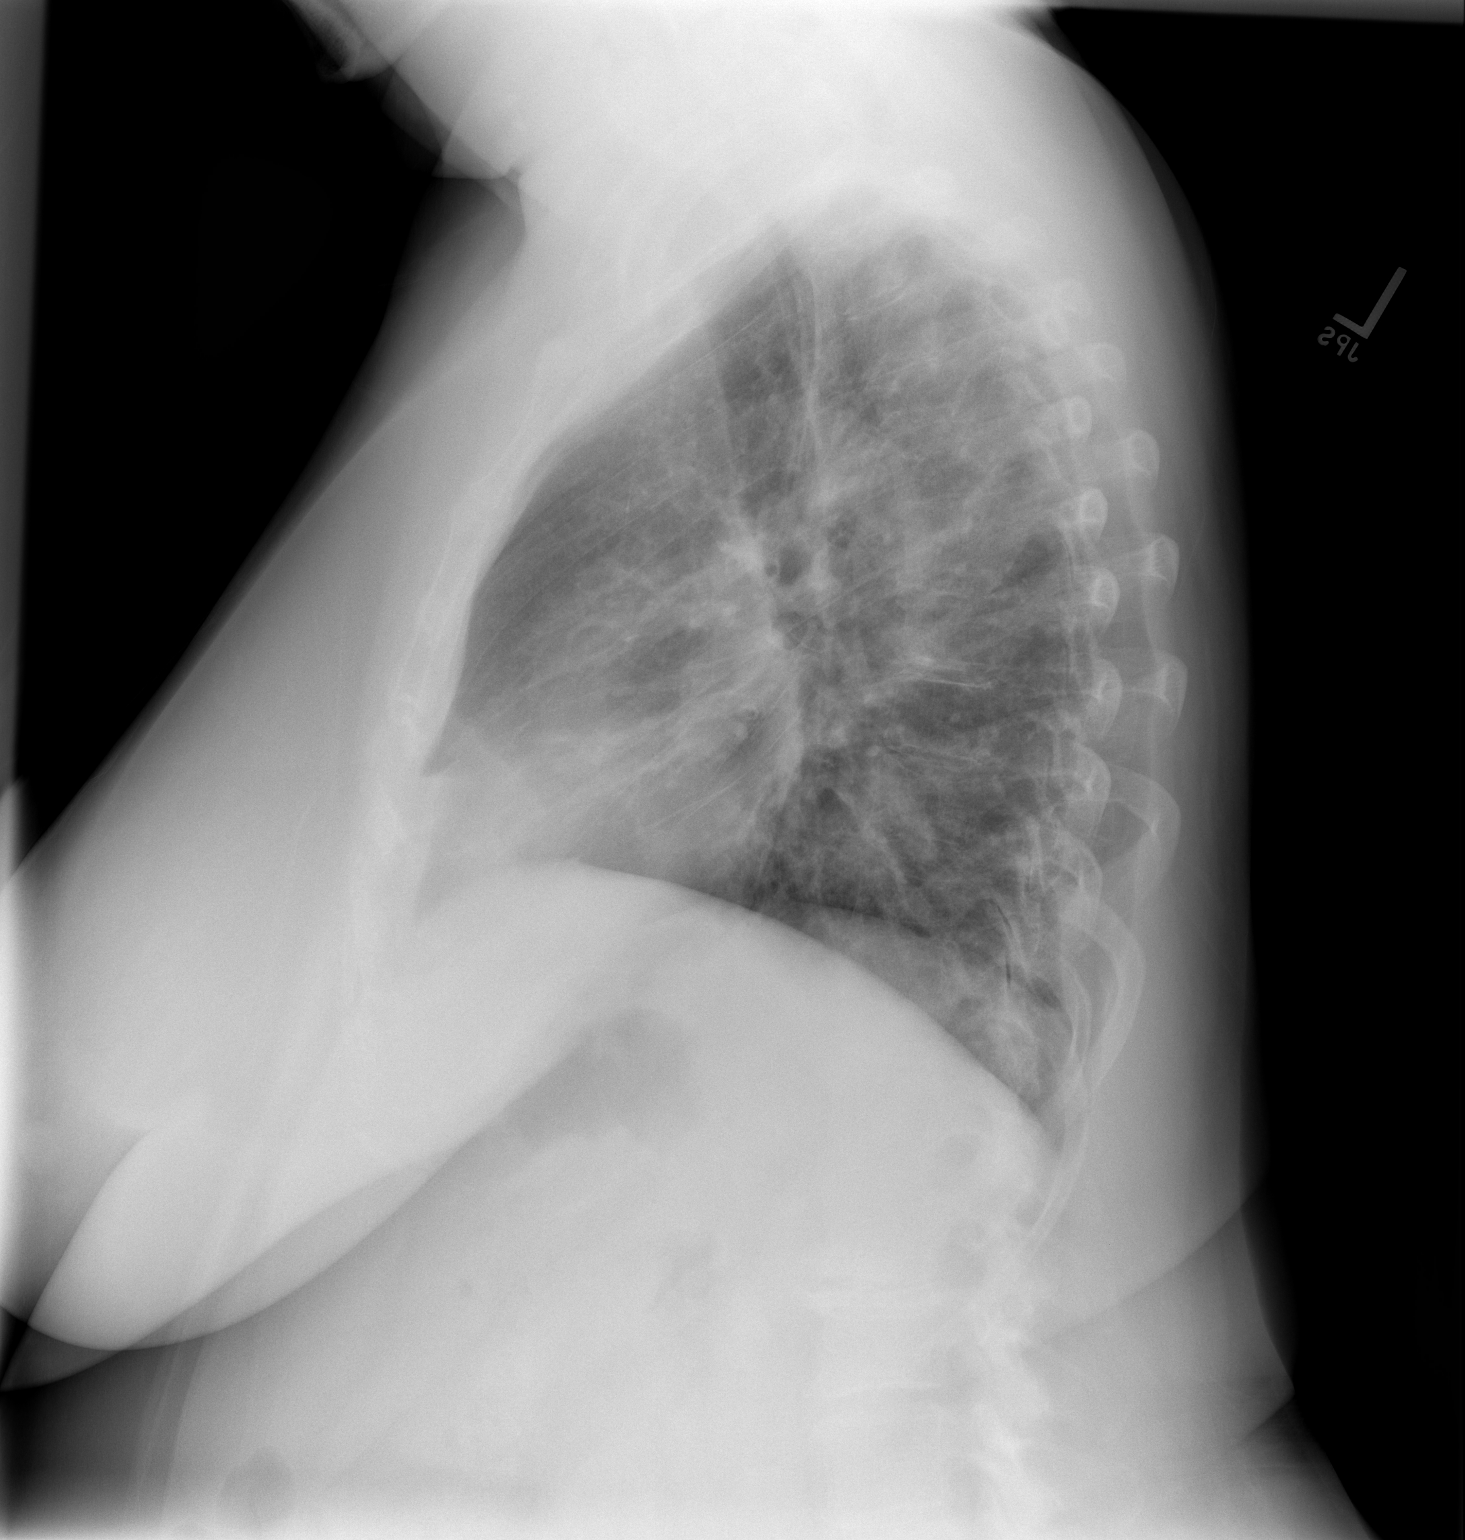

[2 of 2 positions shown; findings below may reference images not displayed]

FINDINGS: There is mild bibasilar atelectasis but no definite
pneumonia or effusion is seen.  There is some peribronchial
thickening which may indicate bronchitis.  Mediastinal contours are
stable.  The heart is mildly enlarged and stable and there is a
mild thoracic scoliosis noted.
IMPRESSION: 1.  Mild bibasilar atelectasis.
2.  Suspect bronchitis.  Stable mild cardiomegaly

## 2013-02-08 MED ORDER — BENZONATATE 100 MG PO CAPS: 200.0000 mg | ORAL_CAPSULE | Freq: Two times a day (BID) | ORAL | Status: DC | PRN

## 2013-02-08 MED ORDER — ALBUTEROL SULFATE (5 MG/ML) 0.5% IN NEBU
INHALATION_SOLUTION | RESPIRATORY_TRACT | Status: AC
  Administered 2013-02-08: 12:00:00
  Filled 2013-02-08: qty 1

## 2013-02-08 MED ORDER — AEROCHAMBER PLUS W/MASK MISC
Status: AC
  Filled 2013-02-08: qty 1

## 2013-02-08 MED ORDER — ALBUTEROL SULFATE HFA 108 (90 BASE) MCG/ACT IN AERS: 2.0000 | INHALATION_SPRAY | RESPIRATORY_TRACT | Status: DC | PRN

## 2013-02-08 MED ORDER — ALBUTEROL (5 MG/ML) CONTINUOUS INHALATION SOLN
10.0000 mg | INHALATION_SOLUTION | RESPIRATORY_TRACT | Status: AC
  Administered 2013-02-08: 10 mg via RESPIRATORY_TRACT
  Filled 2013-02-08: qty 20

## 2013-02-08 MED ORDER — IPRATROPIUM BROMIDE 0.02 % IN SOLN
0.5000 mg | Freq: Once | RESPIRATORY_TRACT | Status: AC
  Administered 2013-02-08: 0.5 mg via RESPIRATORY_TRACT
  Filled 2013-02-08: qty 2.5

## 2013-02-08 MED ORDER — PREDNISONE 20 MG PO TABS
40.0000 mg | ORAL_TABLET | Freq: Once | ORAL | Status: AC
  Administered 2013-02-08: 40 mg via ORAL
  Filled 2013-02-08: qty 2

## 2013-02-08 MED ORDER — LEVOFLOXACIN 750 MG PO TABS: 750.0000 mg | ORAL_TABLET | Freq: Every day | ORAL | Status: DC

## 2013-02-08 MED ORDER — LEVOFLOXACIN 750 MG PO TABS
750.0000 mg | ORAL_TABLET | Freq: Once | ORAL | Status: AC
  Administered 2013-02-08: 750 mg via ORAL
  Filled 2013-02-08: qty 1

## 2013-02-08 NOTE — ED Provider Notes (Signed)
History     CSN: [id number]  Arrival date & time 02/08/13  1148   First MD Initiated Contact with Patient 02/08/13 1156      Chief Complaint  Patient presents with  . Shortness of Breath    (Consider location/radiation/quality/duration/timing/severity/associated sxs/prior treatment) HPI Comments: 62 year old female with a history of exercise-induced asthma as well as frequent seasonal allergies and recurrent sinus infections. She presents with a complaint of cough and shortness of breath which has been gradually worsening over the last couple of days. She was started on some residual Keflex that they had at the house on Monday but this has had minimal improvement after taking 1 g by mouth daily. She has had persistent drainage and postnasal drip, sinus pressure, increased cough which has become intermittently productive. The symptoms are gradually worsening, nothing seems to make it better. She also admits to being a diabetic and has some difficulty controlling her blood sugars. 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  Patient is a 62 y.o. female presenting with shortness of breath. The history is provided by the patient and the spouse.  Shortness of Breath   Past Medical History  Diagnosis Date  . Diabetes mellitus   . Asthma   . Hx gestational diabetes   . Breast pain   . Fibroid   . Pelvic pain     Past Surgical History  Procedure Laterality Date  . Cesarean section    . Rotator cuff repair  2003  . Appendectomy  1959  . Tonsillectomy  1968  . Myomectomy  1980, 2004, 2007    Family History  Problem Relation Age of Onset  . Cancer Cousin     colon   . Diabetes Cousin   . Cancer Mother     abdominal melamona  . Diabetes Maternal Aunt     History  Substance Use Topics  . Smoking status: Never Smoker   . Smokeless tobacco: Not on file   . Alcohol Use: No    OB History   Grav Para Term Preterm Abortions TAB SAB Ect Mult Living   2 1        1       Review of Systems  Respiratory: Positive for shortness of breath.   All other systems reviewed and are negative.    Allergies  Food; Statins; Lobster; and Tetracyclines & related  Home Medications   Current Outpatient Rx  Name  Route  Sig  Dispense  Refill  . albuterol (PROVENTIL HFA;VENTOLIN HFA) 108 (90 BASE) MCG/ACT inhaler   Inhalation   Inhale 2 puffs into the lungs every 6 (six) hours as needed for wheezing or shortness of breath.         Marland Kitchen aspirin 325 MG tablet   Oral   Take 325 mg by mouth daily.         . Cholecalciferol (VITAMIN D3 PO)   Oral   Take 2,000 Int'l Units by mouth daily.         . [facility] CINNAMON PO   Oral   Take 250 mg by mouth 2 (two) times daily.          . Cyanocobalamin (VITAMIN B 12 PO)   Oral   Take 1 tablet by mouth daily.         . fluticasone (FLONASE) 50 MCG/ACT nasal spray  Nasal   Place 2 sprays into the nose daily.         Marland Kitchen glimepiride (AMARYL) 4 MG tablet   Oral   Take 4 mg by mouth 2 (two) times daily.         Marland Kitchen lactobacillus acidophilus (BACID) TABS   Oral   Take 1 tablet by mouth daily. PROBIOTIC         . Linagliptin-Metformin HCl (JENTADUETO) 2.02-999 MG TABS   Oral   Take 1 tablet by mouth 2 (two) times daily.          . Manganese 10 MG TABS   Oral   Take 1 tablet by mouth daily.          . milk thistle 175 MG tablet   Oral   Take 525 mg by mouth daily. Takes 3 daily         . oxymetazoline (AFRIN) 0.05 % nasal spray   Nasal   Place 2 sprays into the nose 2 (two) times daily as needed for congestion.         . potassium chloride (K-DUR) 10 MEQ tablet   Oral   Take 10 mEq by mouth daily.         Marland Kitchen Resveratrol 250 MG CAPS   Oral   Take 1 capsule by mouth daily.         Marland Kitchen UNABLE TO FIND   Oral   Take 800 mg by mouth 2 (two) times daily. Med Name: Hart Robinsons          . UNABLE TO FIND   Oral   Take 1 tablet by mouth 2 (two) times daily. Med Name: Ma [place] ([place])         . UNABLE TO FIND   Oral   Take 1 tablet by mouth daily. Med Name: Ashwagandha Extract         . valsartan-hydrochlorothiazide (DIOVAN-HCT) 160-12.5 MG per tablet   Oral   Take 1 tablet by mouth daily.         Marland Kitchen albuterol (PROVENTIL HFA;VENTOLIN HFA) 108 (90 BASE) MCG/ACT inhaler   Inhalation   Inhale 2 puffs into the lungs every 4 (four) hours as needed for wheezing or shortness of breath.   1 Inhaler   3   . benzonatate (TESSALON) 100 MG capsule   Oral   Take 2 capsules (200 mg total) by mouth 2 (two) times daily as needed for cough.   20 capsule   0   . levofloxacin (LEVAQUIN) 750 MG tablet   Oral   Take 1 tablet (750 mg total) by mouth daily.   7 tablet   0   . ONETOUCH DELICA LANCETS MISC      USE WITH METER TWICE DAILY TESTING   100 each   12   . EXPIRED: valsartan-hydrochlorothiazide (DIOVAN HCT) 160-12.5 MG per tablet   Oral   Take 1 tablet by mouth daily.   30 tablet   11     Dose increase effective 01/02/2012     BP 157/76  Pulse 112  Temp(Src) 98.5 F (36.9 C) (Oral)  Resp 20  SpO2 97%  Physical Exam  Nursing note and vitals reviewed. Constitutional: She appears well-developed and well-nourished. No distress.  HENT:  Head: Normocephalic and atraumatic.  Mouth/Throat: Oropharynx is clear and moist. No oropharyngeal exudate.  Nasal passages with swollen turbinates and clear rhinorrhea present bilaterally  Eyes: Conjunctivae and EOM are normal. Pupils are equal, round, and  reactive to light. Right eye exhibits no discharge. Left eye exhibits no discharge. No scleral icterus.  Neck: Normal range of motion. Neck supple. No JVD present. No thyromegaly present.  Cardiovascular: Normal rate, regular rhythm, normal heart sounds and intact distal pulses.  Exam reveals no gallop and no friction rub.   No murmur  heard. Pulmonary/Chest: She has wheezes. She has no rales.  Slight increased work of breathing, mild tachypnea, no accessory muscle use, speaks in full sentences, decreased air movement in all lung fields, mild wheezing in all lung fields on expiration, prolonged expiratory phase present  Abdominal: Soft. Bowel sounds are normal. She exhibits no distension and no mass. There is no tenderness.  Musculoskeletal: Normal range of motion. She exhibits no edema and no tenderness.  Lymphadenopathy:    She has no cervical adenopathy.  Neurological: She is alert. Coordination normal.  Skin: Skin is warm and dry. No rash noted. No erythema.  Psychiatric: She has a normal mood and affect. Her behavior is normal.    ED Course  Procedures (including critical care time)  Labs Reviewed  BASIC METABOLIC PANEL - Abnormal; Notable for the following:    Sodium 134 (*)    Glucose, Bld 337 (*)    All other components within normal limits  CBC WITH DIFFERENTIAL - Abnormal; Notable for the following:    HCT 34.9 (*)    MCHC 36.7 (*)    All other components within normal limits   Dg Chest 2 View  02/08/2013  *RADIOLOGY REPORT*  Clinical Data: Short of breath, wheezing, cough  CHEST - 2 VIEW  Comparison: Chest x-ray of 12/31/2007  Findings: There is mild bibasilar atelectasis but no definite pneumonia or effusion is seen.  There is some peribronchial thickening which may indicate bronchitis.  Mediastinal contours are stable.  The heart is mildly enlarged and stable and there is a mild thoracic scoliosis noted.  IMPRESSION:  1.  Mild bibasilar atelectasis. 2.  Suspect bronchitis.  Stable mild cardiomegaly   Original Report Authenticated By: Ivar Drape, M.D.      1. Bronchitis       MDM  Patient has evidence of upper respiratory infection with sinusitis as well as ongoing reactive airway disease likely secondary to infection. With intermittently productive cough and decreased lung sounds with no significant  history of severe asthma we'll obtain a chest x-ray to rule out pneumonia, evaluate for leukocytosis, renal function and glucose level with basic metabolic panel, continuous nebulizer, antibiotics.  Improved with nebs - on reexamination has clear lungs, no tachycardia, no hypoxia.  PA and lateral views of the chest were obtained by digital radiography. I have personally interpreted these x-rays and find her to be no signs of pulmonary infiltrate, cardiomegaly, subdiaphragmatic free air, soft tissue abnormality, no obvious bony abnormalities or fractures.  Discharge patient with prescription for Tessalon, albuterol and Levaquin, patient expresses her understanding.  Meds given in ED:  Medications  albuterol (PROVENTIL,VENTOLIN) solution continuous neb (0 mg Nebulization Stopped 02/08/13 1444)  albuterol (PROVENTIL) (5 MG/ML) 0.5% nebulizer solution (  Given 02/08/13 1212)  ipratropium (ATROVENT) nebulizer solution 0.5 mg (0.5 mg Nebulization Given 02/08/13 1222)  predniSONE (DELTASONE) tablet 40 mg (40 mg Oral Given 02/08/13 1221)  levofloxacin (LEVAQUIN) tablet 750 mg (750 mg Oral Given 02/08/13 1341)    New Prescriptions   ALBUTEROL (PROVENTIL HFA;VENTOLIN HFA) 108 (90 BASE) MCG/ACT INHALER    Inhale 2 puffs into the lungs every 4 (four) hours as needed for wheezing or  shortness of breath.   BENZONATATE (TESSALON) 100 MG CAPSULE    Take 2 capsules (200 mg total) by mouth 2 (two) times daily as needed for cough.   LEVOFLOXACIN (LEVAQUIN) 750 MG TABLET    Take 1 tablet (750 mg total) by mouth daily.      Johnna Acosta, MD 02/08/13 [id number]

## 2013-02-08 NOTE — ED Notes (Signed)
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 Pt as his of induced asthma but not taking any medication for asthma. Getting worse since yesterday and last night cough all night. Took neb treatment at home with no improvement. PCP called and Rx was called not stated feeling worse.

## 2013-09-15 IMAGING — CR DG CERVICAL SPINE COMPLETE 4+V
6 series · 6 of 6 positions shown · non-contrast
Comparison: None.

CLINICAL DATA: Pain radiating into left shoulder for 3 weeks, neck
stiffness

EXAM:
CERVICAL SPINE  4+ VIEWS

[w c-spine lat]
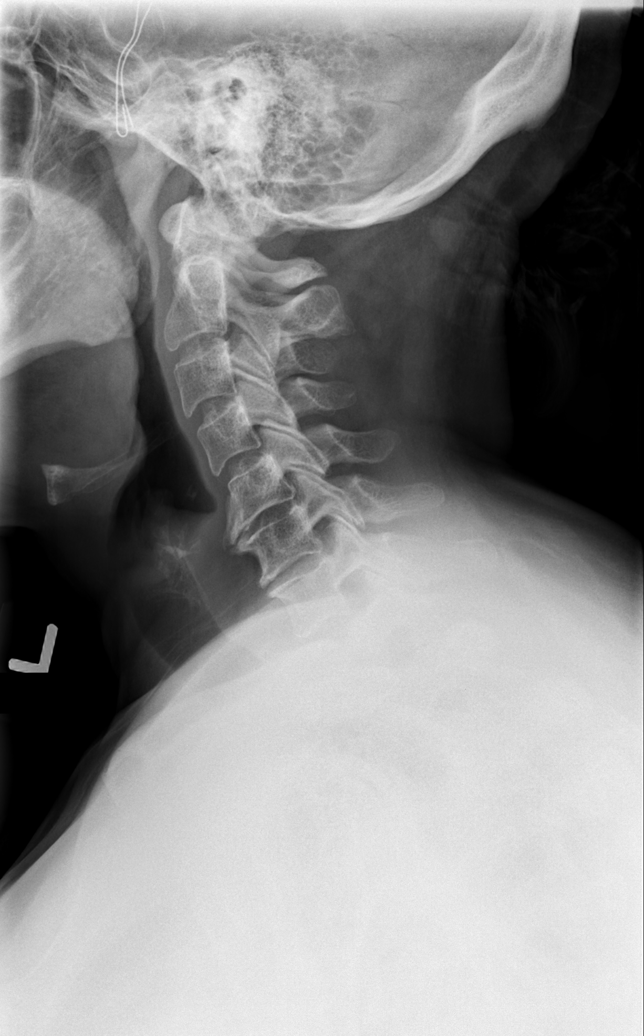

[w c-spine oblique (1 of 2)]
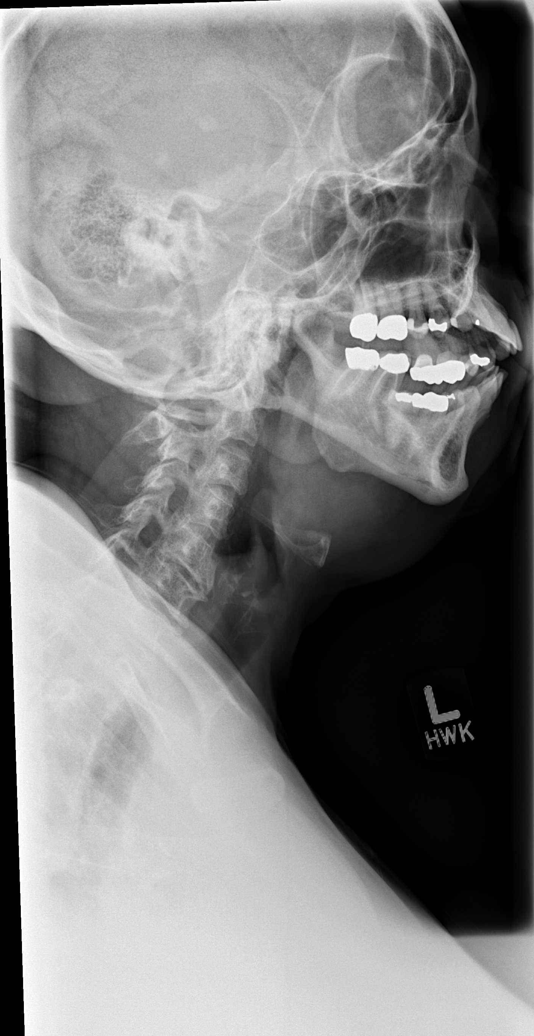

[w c-spine oblique (2 of 2)]
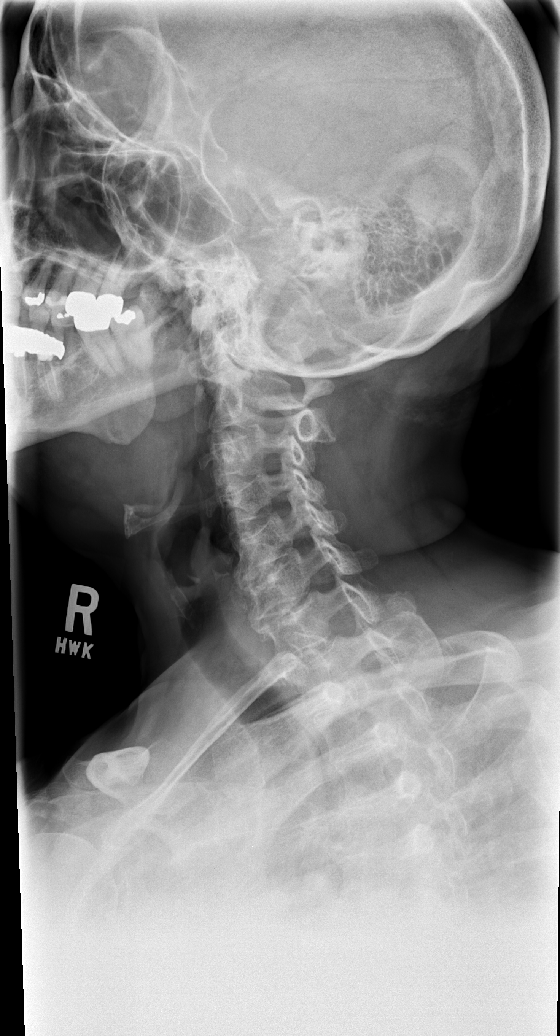

[w c-spine a.p. *]
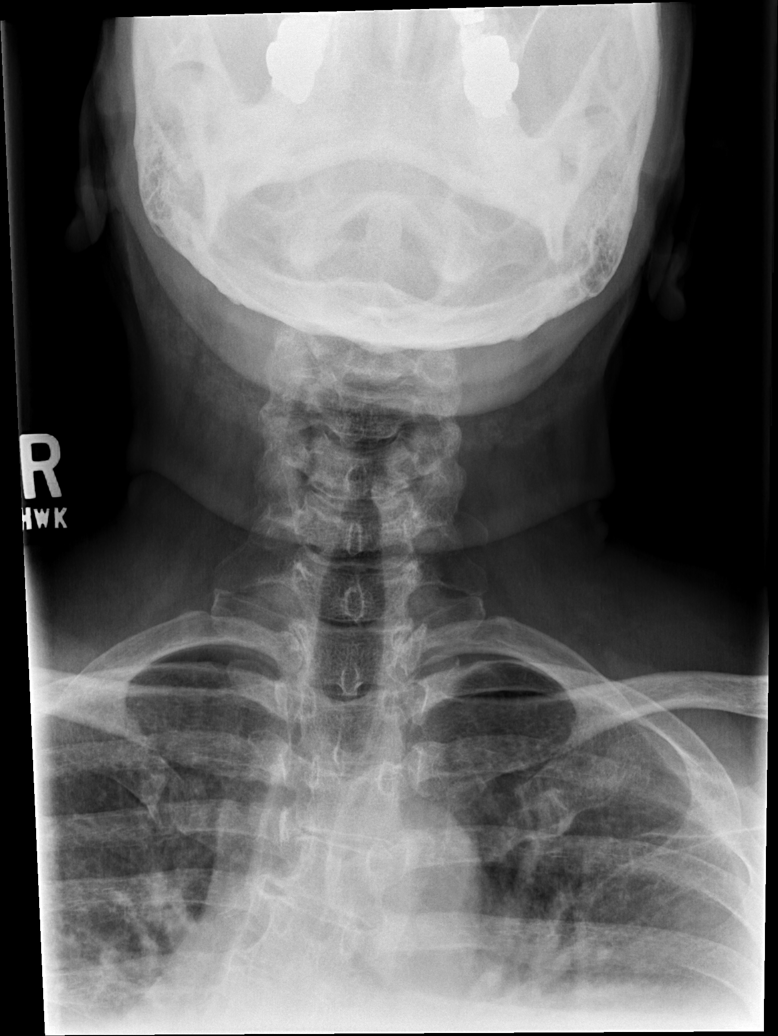

[w c-spine odontoid *]
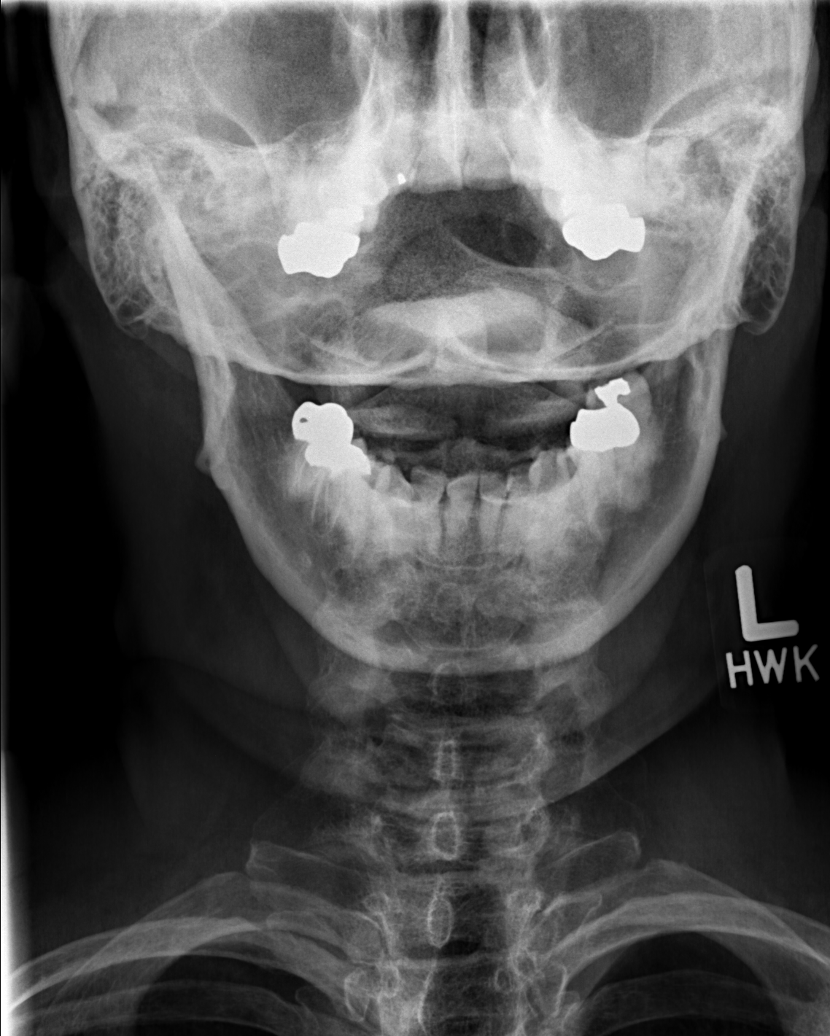

[w swimmers view *]
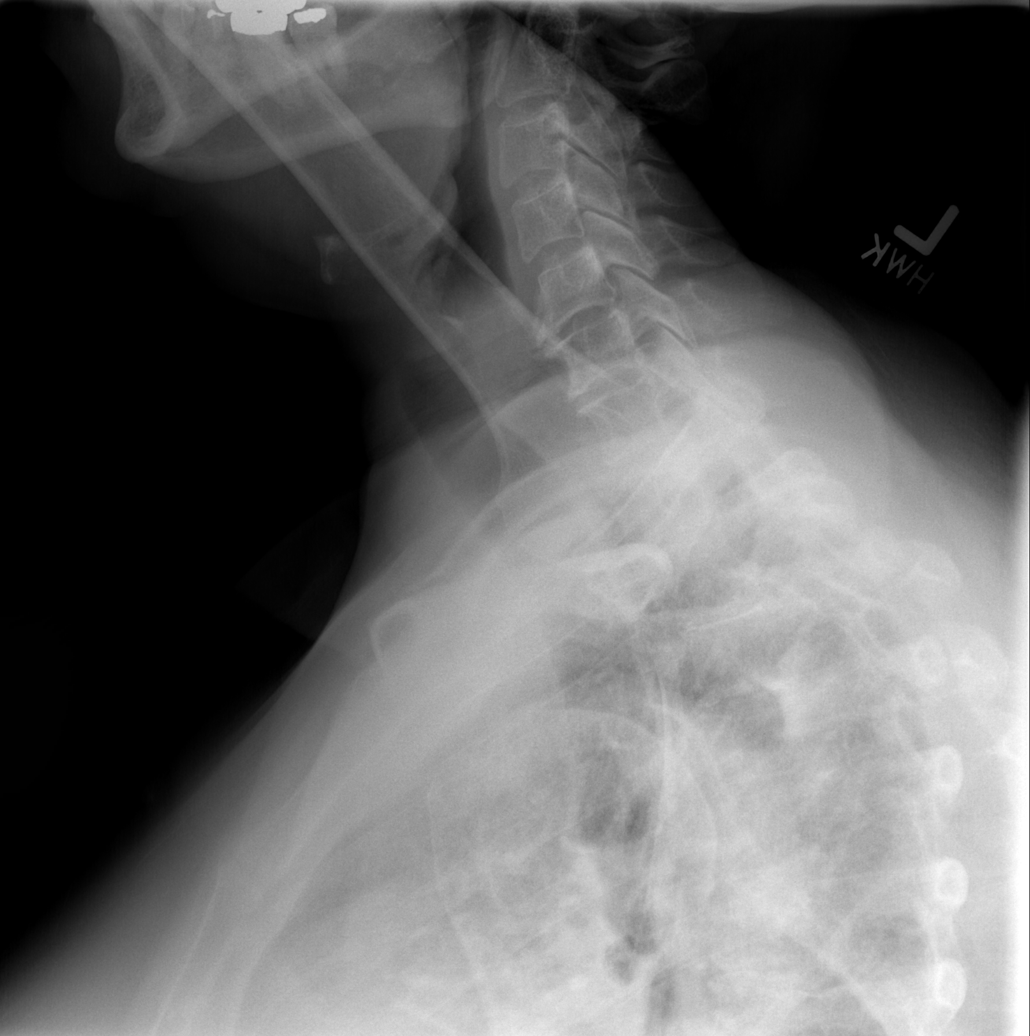

[6 of 6 positions shown; findings below may reference images not displayed]

FINDINGS: There is no evidence of cervical spine fracture or prevertebral soft
tissue swelling. Alignment is normal. No other significant bone
abnormalities are identified. Degenerative disc disease at C5-6 and
C6-7. No significant foraminal encroachment.
IMPRESSION: Degenerative disc disease at C5-6 and C6-7.

## 2013-10-02 ENCOUNTER — Encounter (INDEPENDENT_AMBULATORY_CARE_PROVIDER_SITE_OTHER): Payer: BC Managed Care – PPO | Admitting: Ophthalmology

## 2013-10-02 DIAGNOSIS — H4310 Vitreous hemorrhage, unspecified eye: Secondary | ICD-10-CM

## 2013-10-02 DIAGNOSIS — E1139 Type 2 diabetes mellitus with other diabetic ophthalmic complication: Secondary | ICD-10-CM

## 2013-10-02 DIAGNOSIS — H2510 Age-related nuclear cataract, unspecified eye: Secondary | ICD-10-CM

## 2013-10-02 DIAGNOSIS — H3581 Retinal edema: Secondary | ICD-10-CM

## 2013-10-02 DIAGNOSIS — E11359 Type 2 diabetes mellitus with proliferative diabetic retinopathy without macular edema: Secondary | ICD-10-CM

## 2013-10-02 DIAGNOSIS — E11319 Type 2 diabetes mellitus with unspecified diabetic retinopathy without macular edema: Secondary | ICD-10-CM

## 2013-10-02 DIAGNOSIS — H43819 Vitreous degeneration, unspecified eye: Secondary | ICD-10-CM

## 2013-10-24 HISTORY — PX: OTHER SURGICAL HISTORY: SHX169

## 2013-11-04 ENCOUNTER — Other Ambulatory Visit: Payer: Self-pay

## 2013-11-04 DIAGNOSIS — Z1231 Encounter for screening mammogram for malignant neoplasm of breast: Secondary | ICD-10-CM

## 2013-11-06 ENCOUNTER — Other Ambulatory Visit (INDEPENDENT_AMBULATORY_CARE_PROVIDER_SITE_OTHER): Payer: BC Managed Care – PPO | Admitting: Ophthalmology

## 2013-11-06 DIAGNOSIS — E1139 Type 2 diabetes mellitus with other diabetic ophthalmic complication: Secondary | ICD-10-CM

## 2013-11-06 DIAGNOSIS — E11359 Type 2 diabetes mellitus with proliferative diabetic retinopathy without macular edema: Secondary | ICD-10-CM

## 2013-11-06 DIAGNOSIS — E1165 Type 2 diabetes mellitus with hyperglycemia: Secondary | ICD-10-CM

## 2013-11-21 ENCOUNTER — Ambulatory Visit
Admission: RE | Admit: 2013-11-21 | Discharge: 2013-11-21 | Disposition: A | Discharge status: Home / Self Care | Payer: Medicare Other | Source: Ambulatory Visit

## 2013-11-21 DIAGNOSIS — Z1231 Encounter for screening mammogram for malignant neoplasm of breast: Secondary | ICD-10-CM

## 2013-12-02 DIAGNOSIS — Z124 Encounter for screening for malignant neoplasm of cervix: Secondary | ICD-10-CM | POA: Diagnosis not present

## 2014-03-18 DIAGNOSIS — M899 Disorder of bone, unspecified: Secondary | ICD-10-CM | POA: Diagnosis not present

## 2014-03-18 DIAGNOSIS — M949 Disorder of cartilage, unspecified: Secondary | ICD-10-CM | POA: Diagnosis not present

## 2014-03-19 ENCOUNTER — Ambulatory Visit (INDEPENDENT_AMBULATORY_CARE_PROVIDER_SITE_OTHER): Payer: Medicare Other | Admitting: Ophthalmology

## 2014-03-19 DIAGNOSIS — E11359 Type 2 diabetes mellitus with proliferative diabetic retinopathy without macular edema: Secondary | ICD-10-CM | POA: Diagnosis not present

## 2014-03-19 DIAGNOSIS — E1165 Type 2 diabetes mellitus with hyperglycemia: Secondary | ICD-10-CM | POA: Diagnosis not present

## 2014-03-19 DIAGNOSIS — E11319 Type 2 diabetes mellitus with unspecified diabetic retinopathy without macular edema: Secondary | ICD-10-CM | POA: Diagnosis not present

## 2014-03-19 DIAGNOSIS — H43819 Vitreous degeneration, unspecified eye: Secondary | ICD-10-CM

## 2014-03-19 DIAGNOSIS — E1139 Type 2 diabetes mellitus with other diabetic ophthalmic complication: Secondary | ICD-10-CM | POA: Diagnosis not present

## 2014-03-19 DIAGNOSIS — H2510 Age-related nuclear cataract, unspecified eye: Secondary | ICD-10-CM

## 2014-04-08 DIAGNOSIS — I129 Hypertensive chronic kidney disease with stage 1 through stage 4 chronic kidney disease, or unspecified chronic kidney disease: Secondary | ICD-10-CM | POA: Diagnosis not present

## 2014-04-08 DIAGNOSIS — H9190 Unspecified hearing loss, unspecified ear: Secondary | ICD-10-CM | POA: Diagnosis not present

## 2014-04-08 DIAGNOSIS — E1129 Type 2 diabetes mellitus with other diabetic kidney complication: Secondary | ICD-10-CM | POA: Diagnosis not present

## 2014-04-08 DIAGNOSIS — N058 Unspecified nephritic syndrome with other morphologic changes: Secondary | ICD-10-CM | POA: Diagnosis not present

## 2014-04-08 DIAGNOSIS — Z0000 Encounter for general adult medical examination without abnormal findings: Secondary | ICD-10-CM | POA: Diagnosis not present

## 2014-04-08 DIAGNOSIS — E559 Vitamin D deficiency, unspecified: Secondary | ICD-10-CM | POA: Diagnosis not present

## 2014-04-08 DIAGNOSIS — E1165 Type 2 diabetes mellitus with hyperglycemia: Secondary | ICD-10-CM | POA: Diagnosis not present

## 2014-04-08 DIAGNOSIS — N182 Chronic kidney disease, stage 2 (mild): Secondary | ICD-10-CM | POA: Diagnosis not present

## 2014-04-08 DIAGNOSIS — IMO0001 Reserved for inherently not codable concepts without codable children: Secondary | ICD-10-CM | POA: Diagnosis not present

## 2014-04-28 DIAGNOSIS — J343 Hypertrophy of nasal turbinates: Secondary | ICD-10-CM | POA: Diagnosis not present

## 2014-04-28 DIAGNOSIS — J310 Chronic rhinitis: Secondary | ICD-10-CM | POA: Diagnosis not present

## 2014-04-28 DIAGNOSIS — H903 Sensorineural hearing loss, bilateral: Secondary | ICD-10-CM | POA: Diagnosis not present

## 2014-06-09 DIAGNOSIS — H9209 Otalgia, unspecified ear: Secondary | ICD-10-CM | POA: Diagnosis not present

## 2014-06-09 DIAGNOSIS — J310 Chronic rhinitis: Secondary | ICD-10-CM | POA: Diagnosis not present

## 2014-06-09 DIAGNOSIS — H903 Sensorineural hearing loss, bilateral: Secondary | ICD-10-CM | POA: Diagnosis not present

## 2014-07-09 ENCOUNTER — Other Ambulatory Visit: Payer: Self-pay | Admitting: Nurse Practitioner

## 2014-07-09 ENCOUNTER — Encounter (INDEPENDENT_AMBULATORY_CARE_PROVIDER_SITE_OTHER): Payer: Self-pay

## 2014-07-09 ENCOUNTER — Ambulatory Visit
Admission: RE | Admit: 2014-07-09 | Discharge: 2014-07-09 | Disposition: A | Discharge status: Home / Self Care | Payer: Medicare Other | Source: Ambulatory Visit | Attending: Nurse Practitioner | Admitting: Nurse Practitioner

## 2014-07-09 DIAGNOSIS — R52 Pain, unspecified: Secondary | ICD-10-CM

## 2014-07-09 DIAGNOSIS — M5030 Other cervical disc degeneration, unspecified cervical region: Secondary | ICD-10-CM | POA: Diagnosis not present

## 2014-07-09 DIAGNOSIS — L299 Pruritus, unspecified: Secondary | ICD-10-CM | POA: Diagnosis not present

## 2014-07-09 DIAGNOSIS — R21 Rash and other nonspecific skin eruption: Secondary | ICD-10-CM | POA: Diagnosis not present

## 2014-07-18 DIAGNOSIS — M25819 Other specified joint disorders, unspecified shoulder: Secondary | ICD-10-CM | POA: Diagnosis not present

## 2014-07-18 DIAGNOSIS — M5030 Other cervical disc degeneration, unspecified cervical region: Secondary | ICD-10-CM | POA: Diagnosis not present

## 2014-08-04 DIAGNOSIS — L309 Dermatitis, unspecified: Secondary | ICD-10-CM | POA: Diagnosis not present

## 2014-08-11 DIAGNOSIS — E1165 Type 2 diabetes mellitus with hyperglycemia: Secondary | ICD-10-CM | POA: Diagnosis not present

## 2014-08-11 DIAGNOSIS — R21 Rash and other nonspecific skin eruption: Secondary | ICD-10-CM | POA: Diagnosis not present

## 2014-08-11 DIAGNOSIS — I10 Essential (primary) hypertension: Secondary | ICD-10-CM | POA: Diagnosis not present

## 2014-08-11 DIAGNOSIS — D649 Anemia, unspecified: Secondary | ICD-10-CM | POA: Diagnosis not present

## 2014-08-11 DIAGNOSIS — J45909 Unspecified asthma, uncomplicated: Secondary | ICD-10-CM | POA: Diagnosis not present

## 2014-08-15 DIAGNOSIS — E1165 Type 2 diabetes mellitus with hyperglycemia: Secondary | ICD-10-CM | POA: Diagnosis not present

## 2014-08-15 DIAGNOSIS — Z23 Encounter for immunization: Secondary | ICD-10-CM | POA: Diagnosis not present

## 2014-08-25 ENCOUNTER — Encounter (HOSPITAL_COMMUNITY): Payer: Self-pay | Admitting: Emergency Medicine

## 2014-09-01 DIAGNOSIS — G5602 Carpal tunnel syndrome, left upper limb: Secondary | ICD-10-CM | POA: Diagnosis not present

## 2014-09-01 DIAGNOSIS — M25512 Pain in left shoulder: Secondary | ICD-10-CM | POA: Diagnosis not present

## 2014-09-29 DIAGNOSIS — G5602 Carpal tunnel syndrome, left upper limb: Secondary | ICD-10-CM | POA: Diagnosis not present

## 2014-10-01 ENCOUNTER — Ambulatory Visit (INDEPENDENT_AMBULATORY_CARE_PROVIDER_SITE_OTHER): Payer: Medicare Other | Admitting: Ophthalmology

## 2014-10-01 DIAGNOSIS — E11331 Type 2 diabetes mellitus with moderate nonproliferative diabetic retinopathy with macular edema: Secondary | ICD-10-CM

## 2014-10-01 DIAGNOSIS — E11311 Type 2 diabetes mellitus with unspecified diabetic retinopathy with macular edema: Secondary | ICD-10-CM | POA: Diagnosis not present

## 2014-10-01 DIAGNOSIS — H2513 Age-related nuclear cataract, bilateral: Secondary | ICD-10-CM | POA: Diagnosis not present

## 2014-10-01 DIAGNOSIS — H43813 Vitreous degeneration, bilateral: Secondary | ICD-10-CM | POA: Diagnosis not present

## 2014-10-01 DIAGNOSIS — E11351 Type 2 diabetes mellitus with proliferative diabetic retinopathy with macular edema: Secondary | ICD-10-CM

## 2014-11-18 ENCOUNTER — Other Ambulatory Visit: Payer: Self-pay

## 2014-11-18 DIAGNOSIS — Z1231 Encounter for screening mammogram for malignant neoplasm of breast: Secondary | ICD-10-CM

## 2014-11-27 ENCOUNTER — Ambulatory Visit
Admission: RE | Admit: 2014-11-27 | Discharge: 2014-11-27 | Disposition: A | Discharge status: Home / Self Care | Payer: Medicare Other | Source: Ambulatory Visit

## 2014-11-27 DIAGNOSIS — Z1231 Encounter for screening mammogram for malignant neoplasm of breast: Secondary | ICD-10-CM | POA: Diagnosis not present

## 2014-12-03 DIAGNOSIS — D259 Leiomyoma of uterus, unspecified: Secondary | ICD-10-CM | POA: Diagnosis not present

## 2014-12-03 DIAGNOSIS — Z6833 Body mass index (BMI) 33.0-33.9, adult: Secondary | ICD-10-CM | POA: Diagnosis not present

## 2014-12-03 DIAGNOSIS — Z01411 Encounter for gynecological examination (general) (routine) with abnormal findings: Secondary | ICD-10-CM | POA: Diagnosis not present

## 2014-12-08 DIAGNOSIS — H903 Sensorineural hearing loss, bilateral: Secondary | ICD-10-CM | POA: Diagnosis not present

## 2014-12-08 DIAGNOSIS — J310 Chronic rhinitis: Secondary | ICD-10-CM | POA: Diagnosis not present

## 2015-01-13 DIAGNOSIS — N08 Glomerular disorders in diseases classified elsewhere: Secondary | ICD-10-CM | POA: Diagnosis not present

## 2015-01-13 DIAGNOSIS — I129 Hypertensive chronic kidney disease with stage 1 through stage 4 chronic kidney disease, or unspecified chronic kidney disease: Secondary | ICD-10-CM | POA: Diagnosis not present

## 2015-01-13 DIAGNOSIS — E1122 Type 2 diabetes mellitus with diabetic chronic kidney disease: Secondary | ICD-10-CM | POA: Diagnosis not present

## 2015-01-13 DIAGNOSIS — N182 Chronic kidney disease, stage 2 (mild): Secondary | ICD-10-CM | POA: Diagnosis not present

## 2015-02-18 ENCOUNTER — Ambulatory Visit (INDEPENDENT_AMBULATORY_CARE_PROVIDER_SITE_OTHER): Payer: Medicare Other | Admitting: Ophthalmology

## 2015-02-18 DIAGNOSIS — H43813 Vitreous degeneration, bilateral: Secondary | ICD-10-CM | POA: Diagnosis not present

## 2015-02-18 DIAGNOSIS — E11351 Type 2 diabetes mellitus with proliferative diabetic retinopathy with macular edema: Secondary | ICD-10-CM | POA: Diagnosis not present

## 2015-02-18 DIAGNOSIS — E11339 Type 2 diabetes mellitus with moderate nonproliferative diabetic retinopathy without macular edema: Secondary | ICD-10-CM | POA: Diagnosis not present

## 2015-02-18 DIAGNOSIS — E11311 Type 2 diabetes mellitus with unspecified diabetic retinopathy with macular edema: Secondary | ICD-10-CM

## 2015-02-18 DIAGNOSIS — H4312 Vitreous hemorrhage, left eye: Secondary | ICD-10-CM

## 2015-02-18 DIAGNOSIS — H2513 Age-related nuclear cataract, bilateral: Secondary | ICD-10-CM | POA: Diagnosis not present

## 2015-03-04 ENCOUNTER — Encounter (INDEPENDENT_AMBULATORY_CARE_PROVIDER_SITE_OTHER): Payer: Medicare Other | Admitting: Ophthalmology

## 2015-05-06 ENCOUNTER — Encounter (INDEPENDENT_AMBULATORY_CARE_PROVIDER_SITE_OTHER): Payer: Medicare Other | Admitting: Ophthalmology

## 2015-05-20 ENCOUNTER — Encounter (INDEPENDENT_AMBULATORY_CARE_PROVIDER_SITE_OTHER): Payer: Medicare Other | Admitting: Ophthalmology

## 2015-05-20 DIAGNOSIS — I129 Hypertensive chronic kidney disease with stage 1 through stage 4 chronic kidney disease, or unspecified chronic kidney disease: Secondary | ICD-10-CM | POA: Diagnosis not present

## 2015-05-20 DIAGNOSIS — E11359 Type 2 diabetes mellitus with proliferative diabetic retinopathy without macular edema: Secondary | ICD-10-CM | POA: Diagnosis not present

## 2015-05-20 DIAGNOSIS — N08 Glomerular disorders in diseases classified elsewhere: Secondary | ICD-10-CM | POA: Diagnosis not present

## 2015-05-20 DIAGNOSIS — E11319 Type 2 diabetes mellitus with unspecified diabetic retinopathy without macular edema: Secondary | ICD-10-CM | POA: Diagnosis not present

## 2015-05-20 DIAGNOSIS — N182 Chronic kidney disease, stage 2 (mild): Secondary | ICD-10-CM | POA: Diagnosis not present

## 2015-05-20 DIAGNOSIS — Z1389 Encounter for screening for other disorder: Secondary | ICD-10-CM | POA: Diagnosis not present

## 2015-05-20 DIAGNOSIS — E1122 Type 2 diabetes mellitus with diabetic chronic kidney disease: Secondary | ICD-10-CM | POA: Diagnosis not present

## 2015-05-20 DIAGNOSIS — Z0000 Encounter for general adult medical examination without abnormal findings: Secondary | ICD-10-CM | POA: Diagnosis not present

## 2015-05-20 DIAGNOSIS — E559 Vitamin D deficiency, unspecified: Secondary | ICD-10-CM | POA: Diagnosis not present

## 2015-06-05 DIAGNOSIS — M542 Cervicalgia: Secondary | ICD-10-CM | POA: Diagnosis not present

## 2015-06-05 DIAGNOSIS — M25511 Pain in right shoulder: Secondary | ICD-10-CM | POA: Diagnosis not present

## 2015-06-24 DIAGNOSIS — M25511 Pain in right shoulder: Secondary | ICD-10-CM | POA: Diagnosis not present

## 2015-06-24 DIAGNOSIS — M542 Cervicalgia: Secondary | ICD-10-CM | POA: Diagnosis not present

## 2015-07-01 DIAGNOSIS — S161XXD Strain of muscle, fascia and tendon at neck level, subsequent encounter: Secondary | ICD-10-CM | POA: Diagnosis not present

## 2015-07-01 DIAGNOSIS — M542 Cervicalgia: Secondary | ICD-10-CM | POA: Diagnosis not present

## 2015-07-01 DIAGNOSIS — S40011D Contusion of right shoulder, subsequent encounter: Secondary | ICD-10-CM | POA: Diagnosis not present

## 2015-07-01 DIAGNOSIS — M25511 Pain in right shoulder: Secondary | ICD-10-CM | POA: Diagnosis not present

## 2015-07-08 DIAGNOSIS — S40011D Contusion of right shoulder, subsequent encounter: Secondary | ICD-10-CM | POA: Diagnosis not present

## 2015-07-08 DIAGNOSIS — M25511 Pain in right shoulder: Secondary | ICD-10-CM | POA: Diagnosis not present

## 2015-07-08 DIAGNOSIS — S161XXD Strain of muscle, fascia and tendon at neck level, subsequent encounter: Secondary | ICD-10-CM | POA: Diagnosis not present

## 2015-07-08 DIAGNOSIS — M542 Cervicalgia: Secondary | ICD-10-CM | POA: Diagnosis not present

## 2015-07-13 DIAGNOSIS — M25511 Pain in right shoulder: Secondary | ICD-10-CM | POA: Diagnosis not present

## 2015-07-13 DIAGNOSIS — M542 Cervicalgia: Secondary | ICD-10-CM | POA: Diagnosis not present

## 2015-07-13 DIAGNOSIS — S40011D Contusion of right shoulder, subsequent encounter: Secondary | ICD-10-CM | POA: Diagnosis not present

## 2015-07-13 DIAGNOSIS — S161XXD Strain of muscle, fascia and tendon at neck level, subsequent encounter: Secondary | ICD-10-CM | POA: Diagnosis not present

## 2015-07-15 DIAGNOSIS — M542 Cervicalgia: Secondary | ICD-10-CM | POA: Diagnosis not present

## 2015-07-15 DIAGNOSIS — M25511 Pain in right shoulder: Secondary | ICD-10-CM | POA: Diagnosis not present

## 2015-07-15 DIAGNOSIS — S40011D Contusion of right shoulder, subsequent encounter: Secondary | ICD-10-CM | POA: Diagnosis not present

## 2015-07-15 DIAGNOSIS — S161XXD Strain of muscle, fascia and tendon at neck level, subsequent encounter: Secondary | ICD-10-CM | POA: Diagnosis not present

## 2015-07-20 DIAGNOSIS — S40011D Contusion of right shoulder, subsequent encounter: Secondary | ICD-10-CM | POA: Diagnosis not present

## 2015-07-20 DIAGNOSIS — M25511 Pain in right shoulder: Secondary | ICD-10-CM | POA: Diagnosis not present

## 2015-07-20 DIAGNOSIS — M542 Cervicalgia: Secondary | ICD-10-CM | POA: Diagnosis not present

## 2015-07-20 DIAGNOSIS — S161XXD Strain of muscle, fascia and tendon at neck level, subsequent encounter: Secondary | ICD-10-CM | POA: Diagnosis not present

## 2015-07-22 DIAGNOSIS — M25511 Pain in right shoulder: Secondary | ICD-10-CM | POA: Diagnosis not present

## 2015-07-22 DIAGNOSIS — S40011D Contusion of right shoulder, subsequent encounter: Secondary | ICD-10-CM | POA: Diagnosis not present

## 2015-07-22 DIAGNOSIS — M542 Cervicalgia: Secondary | ICD-10-CM | POA: Diagnosis not present

## 2015-07-22 DIAGNOSIS — S161XXD Strain of muscle, fascia and tendon at neck level, subsequent encounter: Secondary | ICD-10-CM | POA: Diagnosis not present

## 2015-07-27 DIAGNOSIS — M25511 Pain in right shoulder: Secondary | ICD-10-CM | POA: Diagnosis not present

## 2015-07-27 DIAGNOSIS — M542 Cervicalgia: Secondary | ICD-10-CM | POA: Diagnosis not present

## 2015-07-27 DIAGNOSIS — S161XXD Strain of muscle, fascia and tendon at neck level, subsequent encounter: Secondary | ICD-10-CM | POA: Diagnosis not present

## 2015-07-27 DIAGNOSIS — S40011D Contusion of right shoulder, subsequent encounter: Secondary | ICD-10-CM | POA: Diagnosis not present

## 2015-08-03 DIAGNOSIS — M25511 Pain in right shoulder: Secondary | ICD-10-CM | POA: Diagnosis not present

## 2015-08-03 DIAGNOSIS — N182 Chronic kidney disease, stage 2 (mild): Secondary | ICD-10-CM | POA: Diagnosis not present

## 2015-08-03 DIAGNOSIS — N08 Glomerular disorders in diseases classified elsewhere: Secondary | ICD-10-CM | POA: Diagnosis not present

## 2015-08-03 DIAGNOSIS — Z23 Encounter for immunization: Secondary | ICD-10-CM | POA: Diagnosis not present

## 2015-08-03 DIAGNOSIS — S161XXD Strain of muscle, fascia and tendon at neck level, subsequent encounter: Secondary | ICD-10-CM | POA: Diagnosis not present

## 2015-08-03 DIAGNOSIS — S40011D Contusion of right shoulder, subsequent encounter: Secondary | ICD-10-CM | POA: Diagnosis not present

## 2015-08-03 DIAGNOSIS — M542 Cervicalgia: Secondary | ICD-10-CM | POA: Diagnosis not present

## 2015-08-03 DIAGNOSIS — E1122 Type 2 diabetes mellitus with diabetic chronic kidney disease: Secondary | ICD-10-CM | POA: Diagnosis not present

## 2015-08-03 DIAGNOSIS — I129 Hypertensive chronic kidney disease with stage 1 through stage 4 chronic kidney disease, or unspecified chronic kidney disease: Secondary | ICD-10-CM | POA: Diagnosis not present

## 2015-08-05 DIAGNOSIS — M25511 Pain in right shoulder: Secondary | ICD-10-CM | POA: Diagnosis not present

## 2015-08-05 DIAGNOSIS — M542 Cervicalgia: Secondary | ICD-10-CM | POA: Diagnosis not present

## 2015-08-05 DIAGNOSIS — S40011D Contusion of right shoulder, subsequent encounter: Secondary | ICD-10-CM | POA: Diagnosis not present

## 2015-08-05 DIAGNOSIS — S161XXD Strain of muscle, fascia and tendon at neck level, subsequent encounter: Secondary | ICD-10-CM | POA: Diagnosis not present

## 2015-08-10 DIAGNOSIS — M542 Cervicalgia: Secondary | ICD-10-CM | POA: Diagnosis not present

## 2015-08-10 DIAGNOSIS — S40011D Contusion of right shoulder, subsequent encounter: Secondary | ICD-10-CM | POA: Diagnosis not present

## 2015-08-10 DIAGNOSIS — M25511 Pain in right shoulder: Secondary | ICD-10-CM | POA: Diagnosis not present

## 2015-08-10 DIAGNOSIS — S161XXD Strain of muscle, fascia and tendon at neck level, subsequent encounter: Secondary | ICD-10-CM | POA: Diagnosis not present

## 2015-08-12 DIAGNOSIS — S161XXD Strain of muscle, fascia and tendon at neck level, subsequent encounter: Secondary | ICD-10-CM | POA: Diagnosis not present

## 2015-08-12 DIAGNOSIS — M542 Cervicalgia: Secondary | ICD-10-CM | POA: Diagnosis not present

## 2015-08-12 DIAGNOSIS — M25511 Pain in right shoulder: Secondary | ICD-10-CM | POA: Diagnosis not present

## 2015-08-12 DIAGNOSIS — S40011D Contusion of right shoulder, subsequent encounter: Secondary | ICD-10-CM | POA: Diagnosis not present

## 2015-08-17 DIAGNOSIS — S40011D Contusion of right shoulder, subsequent encounter: Secondary | ICD-10-CM | POA: Diagnosis not present

## 2015-08-17 DIAGNOSIS — M25511 Pain in right shoulder: Secondary | ICD-10-CM | POA: Diagnosis not present

## 2015-08-17 DIAGNOSIS — M542 Cervicalgia: Secondary | ICD-10-CM | POA: Diagnosis not present

## 2015-08-17 DIAGNOSIS — S161XXD Strain of muscle, fascia and tendon at neck level, subsequent encounter: Secondary | ICD-10-CM | POA: Diagnosis not present

## 2015-08-19 DIAGNOSIS — S40011D Contusion of right shoulder, subsequent encounter: Secondary | ICD-10-CM | POA: Diagnosis not present

## 2015-08-19 DIAGNOSIS — S161XXD Strain of muscle, fascia and tendon at neck level, subsequent encounter: Secondary | ICD-10-CM | POA: Diagnosis not present

## 2015-08-19 DIAGNOSIS — M25511 Pain in right shoulder: Secondary | ICD-10-CM | POA: Diagnosis not present

## 2015-08-19 DIAGNOSIS — M542 Cervicalgia: Secondary | ICD-10-CM | POA: Diagnosis not present

## 2015-11-18 ENCOUNTER — Ambulatory Visit (INDEPENDENT_AMBULATORY_CARE_PROVIDER_SITE_OTHER): Payer: Medicare Other | Admitting: Ophthalmology

## 2015-11-18 DIAGNOSIS — E113593 Type 2 diabetes mellitus with proliferative diabetic retinopathy without macular edema, bilateral: Secondary | ICD-10-CM | POA: Diagnosis not present

## 2015-11-18 DIAGNOSIS — I10 Essential (primary) hypertension: Secondary | ICD-10-CM | POA: Diagnosis not present

## 2015-11-18 DIAGNOSIS — H43813 Vitreous degeneration, bilateral: Secondary | ICD-10-CM

## 2015-11-18 DIAGNOSIS — H35033 Hypertensive retinopathy, bilateral: Secondary | ICD-10-CM | POA: Diagnosis not present

## 2015-11-18 DIAGNOSIS — E11319 Type 2 diabetes mellitus with unspecified diabetic retinopathy without macular edema: Secondary | ICD-10-CM | POA: Diagnosis not present

## 2015-11-18 DIAGNOSIS — H2513 Age-related nuclear cataract, bilateral: Secondary | ICD-10-CM

## 2016-01-15 ENCOUNTER — Other Ambulatory Visit: Payer: Self-pay

## 2016-01-15 DIAGNOSIS — Z1231 Encounter for screening mammogram for malignant neoplasm of breast: Secondary | ICD-10-CM

## 2016-02-01 ENCOUNTER — Ambulatory Visit: Payer: Medicare Other

## 2016-02-02 ENCOUNTER — Ambulatory Visit
Admission: RE | Admit: 2016-02-02 | Discharge: 2016-02-02 | Disposition: A | Discharge status: Home / Self Care | Payer: Medicare Other | Source: Ambulatory Visit

## 2016-02-02 DIAGNOSIS — Z1231 Encounter for screening mammogram for malignant neoplasm of breast: Secondary | ICD-10-CM | POA: Diagnosis not present

## 2016-05-18 ENCOUNTER — Ambulatory Visit (INDEPENDENT_AMBULATORY_CARE_PROVIDER_SITE_OTHER): Payer: Medicare Other | Admitting: Ophthalmology

## 2016-05-18 DIAGNOSIS — I10 Essential (primary) hypertension: Secondary | ICD-10-CM

## 2016-05-18 DIAGNOSIS — E11319 Type 2 diabetes mellitus with unspecified diabetic retinopathy without macular edema: Secondary | ICD-10-CM | POA: Diagnosis not present

## 2016-05-18 DIAGNOSIS — H43813 Vitreous degeneration, bilateral: Secondary | ICD-10-CM | POA: Diagnosis not present

## 2016-05-18 DIAGNOSIS — E113393 Type 2 diabetes mellitus with moderate nonproliferative diabetic retinopathy without macular edema, bilateral: Secondary | ICD-10-CM | POA: Diagnosis not present

## 2016-05-18 DIAGNOSIS — H35033 Hypertensive retinopathy, bilateral: Secondary | ICD-10-CM

## 2016-05-30 DIAGNOSIS — N182 Chronic kidney disease, stage 2 (mild): Secondary | ICD-10-CM | POA: Diagnosis not present

## 2016-05-30 DIAGNOSIS — E1122 Type 2 diabetes mellitus with diabetic chronic kidney disease: Secondary | ICD-10-CM | POA: Diagnosis not present

## 2016-05-30 DIAGNOSIS — N08 Glomerular disorders in diseases classified elsewhere: Secondary | ICD-10-CM | POA: Diagnosis not present

## 2016-05-30 DIAGNOSIS — I129 Hypertensive chronic kidney disease with stage 1 through stage 4 chronic kidney disease, or unspecified chronic kidney disease: Secondary | ICD-10-CM | POA: Diagnosis not present

## 2016-08-23 DIAGNOSIS — Z23 Encounter for immunization: Secondary | ICD-10-CM | POA: Diagnosis not present

## 2016-09-08 DIAGNOSIS — Z23 Encounter for immunization: Secondary | ICD-10-CM | POA: Diagnosis not present

## 2016-09-08 DIAGNOSIS — Z1389 Encounter for screening for other disorder: Secondary | ICD-10-CM | POA: Diagnosis not present

## 2016-09-08 DIAGNOSIS — N182 Chronic kidney disease, stage 2 (mild): Secondary | ICD-10-CM | POA: Diagnosis not present

## 2016-09-08 DIAGNOSIS — I129 Hypertensive chronic kidney disease with stage 1 through stage 4 chronic kidney disease, or unspecified chronic kidney disease: Secondary | ICD-10-CM | POA: Diagnosis not present

## 2016-09-08 DIAGNOSIS — N08 Glomerular disorders in diseases classified elsewhere: Secondary | ICD-10-CM | POA: Diagnosis not present

## 2016-09-08 DIAGNOSIS — E1122 Type 2 diabetes mellitus with diabetic chronic kidney disease: Secondary | ICD-10-CM | POA: Diagnosis not present

## 2016-11-24 ENCOUNTER — Ambulatory Visit (INDEPENDENT_AMBULATORY_CARE_PROVIDER_SITE_OTHER): Payer: Medicare Other | Admitting: Ophthalmology

## 2016-11-30 DIAGNOSIS — M79671 Pain in right foot: Secondary | ICD-10-CM | POA: Diagnosis not present

## 2016-12-01 ENCOUNTER — Ambulatory Visit (INDEPENDENT_AMBULATORY_CARE_PROVIDER_SITE_OTHER): Payer: Medicare Other | Admitting: Ophthalmology

## 2016-12-01 DIAGNOSIS — E113593 Type 2 diabetes mellitus with proliferative diabetic retinopathy without macular edema, bilateral: Secondary | ICD-10-CM | POA: Diagnosis not present

## 2016-12-01 DIAGNOSIS — H4312 Vitreous hemorrhage, left eye: Secondary | ICD-10-CM

## 2016-12-01 DIAGNOSIS — E11319 Type 2 diabetes mellitus with unspecified diabetic retinopathy without macular edema: Secondary | ICD-10-CM | POA: Diagnosis not present

## 2016-12-01 DIAGNOSIS — H43813 Vitreous degeneration, bilateral: Secondary | ICD-10-CM

## 2016-12-05 DIAGNOSIS — Z6832 Body mass index (BMI) 32.0-32.9, adult: Secondary | ICD-10-CM | POA: Diagnosis not present

## 2016-12-05 DIAGNOSIS — D259 Leiomyoma of uterus, unspecified: Secondary | ICD-10-CM | POA: Diagnosis not present

## 2016-12-05 DIAGNOSIS — M859 Disorder of bone density and structure, unspecified: Secondary | ICD-10-CM | POA: Diagnosis not present

## 2016-12-05 DIAGNOSIS — Z01411 Encounter for gynecological examination (general) (routine) with abnormal findings: Secondary | ICD-10-CM | POA: Diagnosis not present

## 2016-12-20 DIAGNOSIS — E08311 Diabetes mellitus due to underlying condition with unspecified diabetic retinopathy with macular edema: Secondary | ICD-10-CM | POA: Diagnosis not present

## 2016-12-20 DIAGNOSIS — H2523 Age-related cataract, morgagnian type, bilateral: Secondary | ICD-10-CM | POA: Insufficient documentation

## 2016-12-26 DIAGNOSIS — M79671 Pain in right foot: Secondary | ICD-10-CM | POA: Diagnosis not present

## 2016-12-29 ENCOUNTER — Encounter (INDEPENDENT_AMBULATORY_CARE_PROVIDER_SITE_OTHER): Payer: Medicare Other | Admitting: Ophthalmology

## 2016-12-29 DIAGNOSIS — E113511 Type 2 diabetes mellitus with proliferative diabetic retinopathy with macular edema, right eye: Secondary | ICD-10-CM | POA: Diagnosis not present

## 2016-12-29 DIAGNOSIS — I10 Essential (primary) hypertension: Secondary | ICD-10-CM | POA: Diagnosis not present

## 2016-12-29 DIAGNOSIS — E113592 Type 2 diabetes mellitus with proliferative diabetic retinopathy without macular edema, left eye: Secondary | ICD-10-CM

## 2016-12-29 DIAGNOSIS — H43813 Vitreous degeneration, bilateral: Secondary | ICD-10-CM

## 2016-12-29 DIAGNOSIS — E11311 Type 2 diabetes mellitus with unspecified diabetic retinopathy with macular edema: Secondary | ICD-10-CM | POA: Diagnosis not present

## 2016-12-29 DIAGNOSIS — H4312 Vitreous hemorrhage, left eye: Secondary | ICD-10-CM | POA: Diagnosis not present

## 2016-12-29 DIAGNOSIS — H2513 Age-related nuclear cataract, bilateral: Secondary | ICD-10-CM | POA: Diagnosis not present

## 2016-12-29 DIAGNOSIS — H35033 Hypertensive retinopathy, bilateral: Secondary | ICD-10-CM | POA: Diagnosis not present

## 2016-12-30 DIAGNOSIS — E559 Vitamin D deficiency, unspecified: Secondary | ICD-10-CM | POA: Diagnosis not present

## 2016-12-30 DIAGNOSIS — I129 Hypertensive chronic kidney disease with stage 1 through stage 4 chronic kidney disease, or unspecified chronic kidney disease: Secondary | ICD-10-CM | POA: Diagnosis not present

## 2016-12-30 DIAGNOSIS — E1122 Type 2 diabetes mellitus with diabetic chronic kidney disease: Secondary | ICD-10-CM | POA: Diagnosis not present

## 2016-12-30 DIAGNOSIS — N182 Chronic kidney disease, stage 2 (mild): Secondary | ICD-10-CM | POA: Diagnosis not present

## 2016-12-30 DIAGNOSIS — N08 Glomerular disorders in diseases classified elsewhere: Secondary | ICD-10-CM | POA: Diagnosis not present

## 2017-01-05 ENCOUNTER — Telehealth: Payer: Self-pay

## 2017-01-05 ENCOUNTER — Encounter: Payer: Self-pay | Admitting: Interventional Cardiology

## 2017-01-05 NOTE — Telephone Encounter (Signed)
SENT NOTES TO SCHEDULING 

## 2017-01-23 DIAGNOSIS — L03317 Cellulitis of buttock: Secondary | ICD-10-CM | POA: Diagnosis not present

## 2017-01-26 DIAGNOSIS — K37 Unspecified appendicitis: Secondary | ICD-10-CM | POA: Insufficient documentation

## 2017-01-29 ENCOUNTER — Emergency Department (HOSPITAL_COMMUNITY): Payer: Medicare Other

## 2017-01-29 ENCOUNTER — Encounter (HOSPITAL_COMMUNITY): Payer: Self-pay

## 2017-01-29 ENCOUNTER — Emergency Department (HOSPITAL_COMMUNITY)
Admission: EM | Admit: 2017-01-29 | Discharge: 2017-01-29 | Disposition: A | Discharge status: Home / Self Care | Payer: Medicare Other | Attending: Emergency Medicine | Admitting: Emergency Medicine

## 2017-01-29 DIAGNOSIS — R05 Cough: Secondary | ICD-10-CM | POA: Diagnosis not present

## 2017-01-29 DIAGNOSIS — I129 Hypertensive chronic kidney disease with stage 1 through stage 4 chronic kidney disease, or unspecified chronic kidney disease: Secondary | ICD-10-CM | POA: Insufficient documentation

## 2017-01-29 DIAGNOSIS — Z7982 Long term (current) use of aspirin: Secondary | ICD-10-CM | POA: Diagnosis not present

## 2017-01-29 DIAGNOSIS — Z79899 Other long term (current) drug therapy: Secondary | ICD-10-CM | POA: Diagnosis not present

## 2017-01-29 DIAGNOSIS — N182 Chronic kidney disease, stage 2 (mild): Secondary | ICD-10-CM | POA: Diagnosis not present

## 2017-01-29 DIAGNOSIS — E1122 Type 2 diabetes mellitus with diabetic chronic kidney disease: Secondary | ICD-10-CM | POA: Diagnosis not present

## 2017-01-29 DIAGNOSIS — J45909 Unspecified asthma, uncomplicated: Secondary | ICD-10-CM | POA: Diagnosis not present

## 2017-01-29 DIAGNOSIS — J208 Acute bronchitis due to other specified organisms: Secondary | ICD-10-CM | POA: Diagnosis not present

## 2017-01-29 DIAGNOSIS — Z7984 Long term (current) use of oral hypoglycemic drugs: Secondary | ICD-10-CM | POA: Diagnosis not present

## 2017-01-29 IMAGING — CR DG CHEST 2V
2 series · 2 of 2 positions shown · non-contrast
Comparison: [DATE] chest radiograph.

CLINICAL DATA: Productive cough for several days

EXAM:
CHEST  2 VIEW

[chest pa]
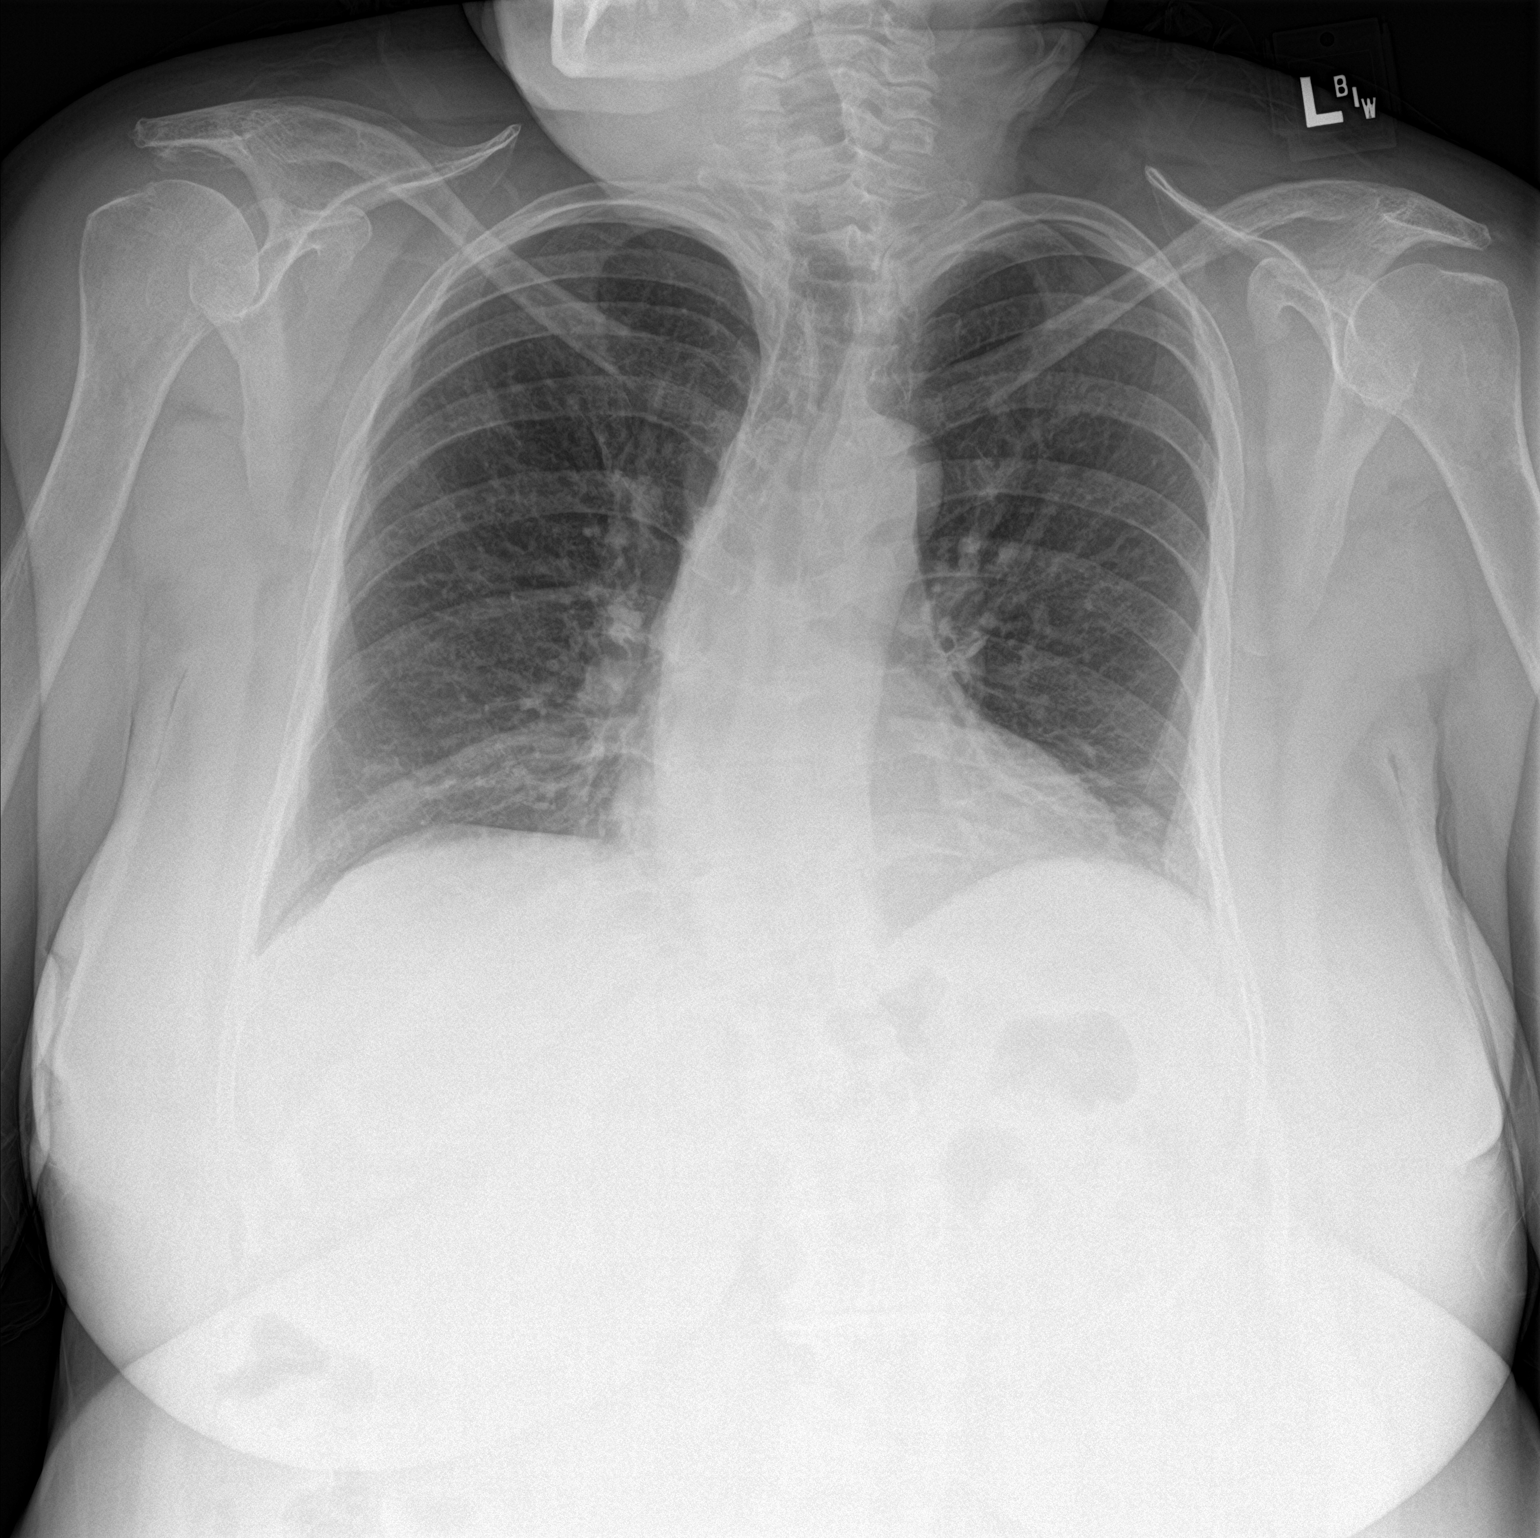

[chest lat]
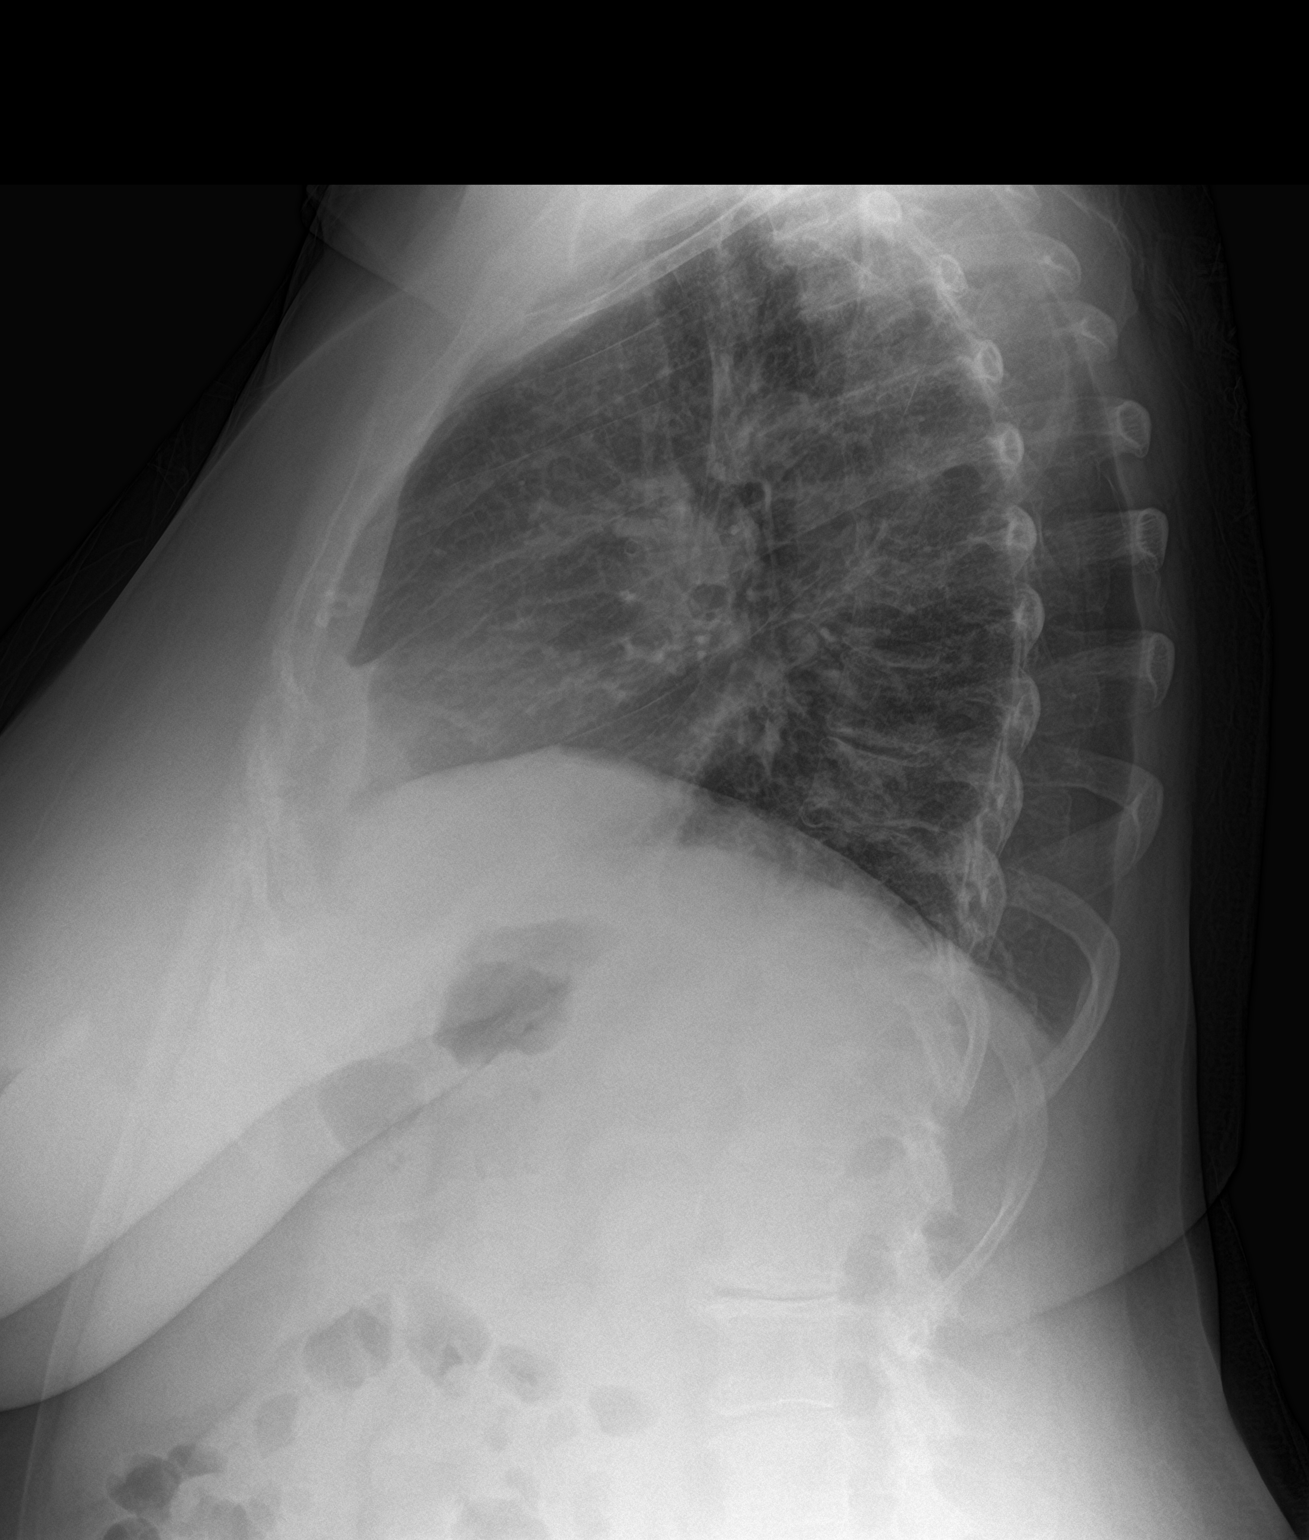

[2 of 2 positions shown; findings below may reference images not displayed]

FINDINGS: Stable cardiomediastinal silhouette with normal heart size. No
pneumothorax. No pleural effusion. No pulmonary edema. Mild left
basilar scarring versus atelectasis. No consolidative airspace
disease.
IMPRESSION: Mild left basilar scarring versus atelectasis. Otherwise no active
cardiopulmonary disease.

## 2017-01-29 MED ORDER — AZITHROMYCIN 250 MG PO TABS: 250.0000 mg | ORAL_TABLET | Freq: Every day | ORAL | 0 refills | Status: DC

## 2017-01-29 MED ORDER — BENZONATATE 100 MG PO CAPS: 100.0000 mg | ORAL_CAPSULE | Freq: Three times a day (TID) | ORAL | 0 refills | Status: DC

## 2017-01-29 MED ORDER — HYDROCOD POLST-CPM POLST ER 10-8 MG/5ML PO SUER: 5.0000 mL | Freq: Every evening | ORAL | 0 refills | Status: DC | PRN

## 2017-01-29 MED ORDER — PREDNISONE 50 MG PO TABS: 50.0000 mg | ORAL_TABLET | Freq: Every day | ORAL | 0 refills | Status: DC

## 2017-01-29 NOTE — ED Provider Notes (Signed)
[facility] DEPT Provider Note   CSN: [id number] Arrival date & time: 01/29/17  1606   By signing my name below, I, Carolyn Russo, attest that this documentation has been prepared under the direction and in the presence of Carolyn Beers, MD. Electronically Signed: Soijett Russo, ED Scribe. 01/29/17. 7:48 PM.  History   Chief Complaint Chief Complaint  Patient presents with  . cough/congestion/ body aches    HPI Carolyn Russo is a 66 y.o. female with a PMHx of asthma, HTN, DM, who presents to the Emergency Department complaining of cough onset 3 days ago. Pt reports associated post-tussive vomiting, sore throat, nasal congestion, and generalized body aches. Pt has not tried any medications for the relief of her symptoms. She reports that she has had to have steroids in the past for an asthma exacerbations, and notes that she takes [place] daily. She notes that her blood sugar ranges from 110-140. She denies fever, wheezing, and any other symptoms.     The history is provided by the patient and a relative. No language interpreter was used.    Past Medical History:  Diagnosis Date  . Anterior chest wall pain   . Appendicitis 1965  . Asthma   . Body mass index 37.0-37.9, adult   . Breast pain   . Chronic renal disease, stage II   . Dehydration 2014  . Deviated septum 1971  . Diabetes mellitus   . Dyspnea 2014  . Extrinsic asthma    WITH ASTHMA ATTACK  . Fibroid 1980  . Hx gestational diabetes   . Hypertension 2014  . Inguinal hernia 1959  . Malaise and fatigue 2014  . Nephropathy   . Non-IgE mediated allergic asthma 2014  . Obesity   . Pelvic pain   . Pregnancy, high-risk 1985  . Tonsillitis 1968  . Uterine fibroid 1980    Patient Active Problem List   Diagnosis Date Noted  . Appendicitis   . Fibroids 11/27/2012  . Dyslipidemia (high LDL; low HDL) 11/25/2011  . Obesity (BMI 30-39.9) 11/25/2011  . HTN, goal below 130/80 11/21/2011  . DM (diabetes mellitus) ([name])  11/21/2011  . Abnormal EKG 11/21/2011    Past Surgical History:  Procedure Laterality Date  . APPENDECTOMY  1959  . CESAREAN SECTION  1985  . HERNIA REPAIR  1959  . [place], 2004, 2007  . RHINOPLASTY  1971  . ROTATOR CUFF REPAIR  2003  . SURGICAL REPAIR OF HEMORRHAGE  2015  . TONSILLECTOMY  1968    OB History    Gravida Para Term Preterm AB Living   2 1       1    SAB TAB Ectopic Multiple Live Births                   Home Medications    Prior to Admission medications   Medication Sig Start Date End Date Taking? Authorizing Provider  albuterol (PROVENTIL HFA;VENTOLIN HFA) 108 (90 BASE) MCG/ACT inhaler Inhale 2 puffs into the lungs every 6 (six) hours as needed for wheezing or shortness of breath.    Historical Provider, MD  albuterol (PROVENTIL HFA;VENTOLIN HFA) 108 (90 BASE) MCG/ACT inhaler Inhale 2 puffs into the lungs every 4 (four) hours as needed for wheezing or shortness of breath. 02/08/13   Carolyn Chapel, MD  aspirin 325 MG tablet Take 325 mg by mouth daily.    Historical Provider, MD  azithromycin (ZITHROMAX Z-PAK) 250 MG tablet Take 1 tablet (250 mg total) by  mouth daily. Take 2 po qD x 1 day, then 1 po qD x 4 days 01/29/17   Carolyn Beers, MD  benzonatate (TESSALON) 100 MG capsule Take 1 capsule (100 mg total) by mouth every 8 (eight) hours. 01/29/17   Carolyn Beers, MD  Besifloxacin HCl (BESIVANCE) 0.6 % SUSP Apply to eye 3 (three) times daily. INSTILL 1 DROP IN [facility] EYE THREE TIMES A DAY    Historical Provider, MD  chlorpheniramine-HYDROcodone (TUSSIONEX PENNKINETIC ER) 10-8 MG/5ML SUER Take 5 mLs by mouth at bedtime as needed for cough. 01/29/17   Carolyn Beers, MD  Cholecalciferol (VITAMIN D3 PO) Take 2,000 Int'l Units by mouth daily.    Historical Provider, MD  [facility] CINNAMON PO Take 250 mg by mouth 2 (two) times daily.     Historical Provider, MD  Cyanocobalamin (VITAMIN B 12 PO) Take 1 tablet by mouth daily.    Historical Provider, MD  fluticasone (FLONASE) 50  MCG/ACT nasal spray Place 2 sprays into the nose daily.    Historical Provider, MD  glimepiride (AMARYL) 4 MG tablet Take 4 mg by mouth 2 (two) times daily.    Historical Provider, MD  lactobacillus acidophilus (BACID) TABS Take 1 tablet by mouth daily. PROBIOTIC    Historical Provider, MD  levofloxacin (LEVAQUIN) 750 MG tablet Take 1 tablet (750 mg total) by mouth daily. 02/08/13   Carolyn Chapel, MD  Linagliptin-Metformin HCl (JENTADUETO) 2.02-999 MG TABS Take 1 tablet by mouth 2 (two) times daily.     Historical Provider, MD  Manganese 10 MG TABS Take 1 tablet by mouth daily.     Historical Provider, MD  milk thistle 175 MG tablet Take 525 mg by mouth daily. Takes 3 daily    Historical Provider, MD  mometasone-formoterol (DULERA) 100-5 MCG/ACT AERO Inhale 2 puffs into the lungs 2 (two) times daily.    Historical Provider, MD  [facility] DELICA LANCETS MISC USE WITH METER TWICE DAILY TESTING 11/21/11   Carolyn Helper, MD  oxymetazoline (AFRIN) 0.05 % nasal spray Place 2 sprays into the nose 2 (two) times daily as needed for congestion.    Historical Provider, MD  potassium chloride (K-DUR) 10 MEQ tablet Take 10 mEq by mouth daily.    Historical Provider, MD  predniSONE (DELTASONE) 50 MG tablet Take 1 tablet (50 mg total) by mouth daily. 01/29/17   Carolyn Beers, MD  Resveratrol 250 MG CAPS Take 1 capsule by mouth daily.    Historical Provider, MD  Saxagliptin-Metformin (KOMBIGLYZE XR) 2.02-999 MG TB24 Take by mouth daily.    Historical Provider, MD  sitaGLIPtin-metformin (JANUMET) 50-1000 MG tablet Take 1 tablet by mouth 2 (two) times daily with a meal.    Historical Provider, MD  UNABLE TO FIND Take 800 mg by mouth 2 (two) times daily. Med Name: [facility]    Historical Provider, MD  UNABLE TO FIND Take 1 tablet by mouth 2 (two) times daily. Med Name: [employer]gra-Lean [place] ([place])    Historical Provider, MD  UNABLE TO FIND Take 1 tablet by mouth daily. Med Name: Ashwagandha Extract     Historical Provider, MD  valsartan-hydrochlorothiazide (DIOVAN HCT) 160-12.5 MG per tablet Take 1 tablet by mouth daily. 01/02/12 01/01/13  Carolyn Helper, MD  valsartan-hydrochlorothiazide (DIOVAN-HCT) 160-12.5 MG per tablet Take 1 tablet by mouth daily.    Historical Provider, MD    Family History Family History  Problem Relation Age of Onset  . Cancer Mother     abdominal melamona  . Psoriasis  Mother   . Alzheimer's disease Father   . Cancer Cousin     colon   . Diabetes Cousin   . Diabetes Maternal Aunt     Social History Social History  Substance Use Topics  . Smoking status: Never Smoker  . Smokeless tobacco: Never Used  . Alcohol use No     Allergies   Food; Statins; Lobster [shellfish allergy]; and Tetracyclines & related   Review of Systems Review of Systems  All other systems reviewed and are negative.    Physical Exam Updated Vital Signs BP (!) 194/79 (BP Location: Right Arm)   Pulse 95   Temp 98.4 F (36.9 C) (Oral)   Resp 18   SpO2 100%   Physical Exam  Constitutional: She is oriented to person, place, and time. She appears well-developed and well-nourished. No distress.  Pt drinking water.  HENT:  Head: Normocephalic and atraumatic.  Mouth/Throat: Oropharynx is clear and moist.  Eyes: EOM are normal.  Neck: Neck supple.  Cardiovascular: Normal rate, regular rhythm and normal heart sounds.  Exam reveals no gallop and no friction rub.   No murmur heard. Pulmonary/Chest: Effort normal and breath sounds normal. No respiratory distress. She has no wheezes. She has no rales.  Prolonged expiratory phase. Breath sounds symmetric.  Abdominal: She exhibits no distension.  Musculoskeletal: Normal range of motion.  No pedal edema  Neurological: She is alert and oriented to person, place, and time.  Skin: Skin is warm and dry.  Psychiatric: She has a normal mood and affect. Her behavior is normal.  Nursing note and vitals reviewed.    ED  Treatments / Results  DIAGNOSTIC STUDIES: Oxygen Saturation is 100% on RA, nl by my interpretation.    COORDINATION OF CARE: 7:45 PM Discussed treatment plan with pt at bedside which includes CXR and pt agreed to plan.   Radiology Dg Chest 2 View  Result Date: 01/29/2017 CLINICAL DATA:  Productive cough for several days EXAM: CHEST  2 VIEW COMPARISON:  02/08/2013 chest radiograph. FINDINGS: Stable cardiomediastinal silhouette with normal heart size. No pneumothorax. No pleural effusion. No pulmonary edema. Mild left basilar scarring versus atelectasis. No consolidative airspace disease. IMPRESSION: Mild left basilar scarring versus atelectasis. Otherwise no active cardiopulmonary disease. Electronically Signed   By: Ilona Sorrel M.D.   On: 01/29/2017 17:33    Procedures Procedures (including critical care time)  Medications Ordered in ED Medications - No data to display   Initial Impression / Assessment and Plan / ED Course  I have reviewed the triage vital signs and the nursing notes.  Pertinent imaging results that were available during my care of the patient were reviewed by me and considered in my medical decision making (see chart for details).     Pt presenting with c/o cough, congestion over the past 3 days, cough has caused her not to sleep at night.  She has been drinking liquids well despite nausea/vomiting, drinking water in the ED.  Glucose has been running 110-140 at home.  No chest pain.  She has bronchitic type cough on exam with prolonged exp phase- mild wheeze.  Chest xray reassuring for pneumonia.  Advised increasing albuterol MDI to every 4 hours.  Given rx for prednisone x 5 days.  Will start tessalon perles, zpack, as well as tussionex to help with cough.   Patient is overall nontoxic and well hydrated in appearance.  Pt advised to keep a close check on her blood glucose.  Discharged with strict return  precautions.  Pt agreeable with plan.   Final Clinical  Impressions(s) / ED Diagnoses   Final diagnoses:  Viral bronchitis    New Prescriptions Discharge Medication List as of 01/29/2017  7:51 PM    START taking these medications   Details  azithromycin (ZITHROMAX Z-PAK) 250 MG tablet Take 1 tablet (250 mg total) by mouth daily. Take 2 po qD x 1 day, then 1 po qD x 4 days, Starting Sun 01/29/2017, Print    chlorpheniramine-HYDROcodone (TUSSIONEX PENNKINETIC ER) 10-8 MG/5ML SUER Take 5 mLs by mouth at bedtime as needed for cough., Starting Sun 01/29/2017, Print    predniSONE (DELTASONE) 50 MG tablet Take 1 tablet (50 mg total) by mouth daily., Starting Sun 01/29/2017, Print       I personally performed the services described in this documentation, which was scribed in my presence. The recorded information has been reviewed and is accurate.      Carolyn Beers, MD 01/29/17 2029

## 2017-01-29 NOTE — ED Triage Notes (Signed)
Patient complains of 2 days of congestion, cough and body aches. Coughing more at night, no fever. Alert and oriented, NAD.

## 2017-02-10 DIAGNOSIS — M79671 Pain in right foot: Secondary | ICD-10-CM | POA: Diagnosis not present

## 2017-02-12 NOTE — Progress Notes (Deleted)
Cardiology Office Note    Date:  02/12/2017   ID:  Carolyn Russo, DOB 11/25/1950, MRN [id number]  PCP:  Maximino Greenland, MD  Cardiologist: Sinclair Grooms, MD   No chief complaint on file.   History of Present Illness:  Carolyn Russo is a 66 y.o. female with obesity, DM 2 with complications and anormal ECG.    Past Medical History:  Diagnosis Date  . Anterior chest wall pain   . Appendicitis 1965  . Asthma   . Body mass index 37.0-37.9, adult   . Breast pain   . Chronic renal disease, stage II   . Dehydration 2014  . Deviated septum 1971  . Diabetes mellitus   . Dyspnea 2014  . Extrinsic asthma    WITH ASTHMA ATTACK  . Fibroid 1980  . Hx gestational diabetes   . Hypertension 2014  . Inguinal hernia 1959  . Malaise and fatigue 2014  . Nephropathy   . Non-IgE mediated allergic asthma 2014  . Obesity   . Pelvic pain   . Pregnancy, high-risk 1985  . Tonsillitis 1968  . Uterine fibroid 1980    Past Surgical History:  Procedure Laterality Date  . APPENDECTOMY  1959  . CESAREAN SECTION  1985  . HERNIA REPAIR  1959  . [place], 2004, 2007  . RHINOPLASTY  1971  . ROTATOR CUFF REPAIR  2003  . SURGICAL REPAIR OF HEMORRHAGE  2015  . TONSILLECTOMY  1968    Current Medications: Outpatient Medications Prior to Visit  Medication Sig Dispense Refill  . albuterol (PROVENTIL HFA;VENTOLIN HFA) 108 (90 BASE) MCG/ACT inhaler Inhale 2 puffs into the lungs every 6 (six) hours as needed for wheezing or shortness of breath.    Marland Kitchen albuterol (PROVENTIL HFA;VENTOLIN HFA) 108 (90 BASE) MCG/ACT inhaler Inhale 2 puffs into the lungs every 4 (four) hours as needed for wheezing or shortness of breath. 1 Inhaler 3  . aspirin 325 MG tablet Take 325 mg by mouth daily.    Marland Kitchen azithromycin (ZITHROMAX Z-PAK) 250 MG tablet Take 1 tablet (250 mg total) by mouth daily. Take 2 po qD x 1 day, then 1 po qD x 4 days 6 tablet 0  . benzonatate (TESSALON) 100 MG capsule Take 1 capsule (100  mg total) by mouth every 8 (eight) hours. 21 capsule 0  . Besifloxacin HCl (BESIVANCE) 0.6 % SUSP Apply to eye 3 (three) times daily. INSTILL 1 DROP IN [facility] EYE THREE TIMES A DAY    . chlorpheniramine-HYDROcodone (TUSSIONEX PENNKINETIC ER) 10-8 MG/5ML SUER Take 5 mLs by mouth at bedtime as needed for cough. 115 mL 0  . Cholecalciferol (VITAMIN D3 PO) Take 2,000 Int'l Units by mouth daily.    . [facility] CINNAMON PO Take 250 mg by mouth 2 (two) times daily.     . Cyanocobalamin (VITAMIN B 12 PO) Take 1 tablet by mouth daily.    . fluticasone (FLONASE) 50 MCG/ACT nasal spray Place 2 sprays into the nose daily.    Marland Kitchen glimepiride (AMARYL) 4 MG tablet Take 4 mg by mouth 2 (two) times daily.    Marland Kitchen lactobacillus acidophilus (BACID) TABS Take 1 tablet by mouth daily. PROBIOTIC    . levofloxacin (LEVAQUIN) 750 MG tablet Take 1 tablet (750 mg total) by mouth daily. 7 tablet 0  . Linagliptin-Metformin HCl (JENTADUETO) 2.02-999 MG TABS Take 1 tablet by mouth 2 (two) times daily.     . Manganese 10 MG TABS Take 1  tablet by mouth daily.     . milk thistle 175 MG tablet Take 525 mg by mouth daily. Takes 3 daily    . mometasone-formoterol (DULERA) 100-5 MCG/ACT AERO Inhale 2 puffs into the lungs 2 (two) times daily.    Glory Rosebush DELICA LANCETS MISC USE WITH METER TWICE DAILY TESTING 100 each 12  . oxymetazoline (AFRIN) 0.05 % nasal spray Place 2 sprays into the nose 2 (two) times daily as needed for congestion.    . potassium chloride (K-DUR) 10 MEQ tablet Take 10 mEq by mouth daily.    . predniSONE (DELTASONE) 50 MG tablet Take 1 tablet (50 mg total) by mouth daily. 5 tablet 0  . Resveratrol 250 MG CAPS Take 1 capsule by mouth daily.    . Saxagliptin-Metformin (KOMBIGLYZE XR) 2.02-999 MG TB24 Take by mouth daily.    . sitaGLIPtin-metformin (JANUMET) 50-1000 MG tablet Take 1 tablet by mouth 2 (two) times daily with a meal.    . UNABLE TO FIND Take 800 mg by mouth 2 (two) times daily. Med Name: Hart Robinsons    . UNABLE  TO FIND Take 1 tablet by mouth 2 (two) times daily. Med Name: Ma Hillock ([place])    . UNABLE TO FIND Take 1 tablet by mouth daily. Med Name: Ashwagandha Extract    . valsartan-hydrochlorothiazide (DIOVAN HCT) 160-12.5 MG per tablet Take 1 tablet by mouth daily. 30 tablet 11  . valsartan-hydrochlorothiazide (DIOVAN-HCT) 160-12.5 MG per tablet Take 1 tablet by mouth daily.     No facility-administered medications prior to visit.      Allergies:   Food; Statins; Lobster [shellfish allergy]; and Tetracyclines & related   Social History   Social History  . Marital status: Married    Spouse name: N/A  . Number of children: N/A  . Years of education: N/A   Social History Main Topics  . Smoking status: Never Smoker  . Smokeless tobacco: Never Used  . Alcohol use No  . Drug use: Unknown  . Sexual activity: Yes    Birth control/ protection: Post-menopausal   Other Topics Concern  . Not on file   Social History Narrative  . No narrative on file     Family History:  The patient's ***family history includes Alzheimer's disease in her father; Cancer in her cousin and mother; Diabetes in her cousin and maternal aunt; Psoriasis in her mother.   ROS:   Please see the history of present illness.    ***  All other systems reviewed and are negative.   PHYSICAL EXAM:   VS:  There were no vitals taken for this visit.   GEN: Well nourished, well developed, in no acute distress  HEENT: normal  Neck: no JVD, carotid bruits, or masses Cardiac: ***RRR; no murmurs, rubs, or gallops,no edema  Respiratory:  clear to auscultation bilaterally, normal work of breathing GI: soft, nontender, nondistended, + BS MS: no deformity or atrophy  Skin: warm and dry, no rash Neuro:  Alert and Oriented x 3, Strength and sensation are intact Psych: euthymic mood, full affect  Wt Readings from Last 3 Encounters:  11/27/12 187 lb (84.8 kg)  01/02/12 193 lb (87.5 kg)  12/13/11 193 lb 4.8  oz (87.7 kg)      Studies/Labs Reviewed:   EKG:  EKG  ***  Recent Labs: No results found for requested labs within last 8760 hours.   Lipid Panel    Component Value Date/Time   CHOL 176 10/13/2011 0903  TRIG 138 10/13/2011 0903   HDL 37 (L) 10/13/2011 0903   CHOLHDL 4.8 10/13/2011 0903   VLDL 28 10/13/2011 0903   LDLCALC 111 (H) 10/13/2011 0903    Additional studies/ records that were reviewed today include:  ***    ASSESSMENT:    1. HTN, goal below 130/80   2. Type 2 diabetes mellitus with complication, without long-term current use of insulin ([place])   3. Abnormal EKG   4. Dyslipidemia (high LDL; low HDL)      PLAN:  In order of problems listed above:  1. ***    Medication Adjustments/Labs and Tests Ordered: Current medicines are reviewed at length with the patient today.  Concerns regarding medicines are outlined above.  Medication changes, Labs and Tests ordered today are listed in the Patient Instructions below. There are no Patient Instructions on file for this visit.   Signed, Sinclair Grooms, MD  02/12/2017 6:19 PM    [facility] [place], [name], [place]  [address] Phone: [phone]; Fax: [phone]

## 2017-02-13 ENCOUNTER — Ambulatory Visit: Payer: Medicare Other | Admitting: Interventional Cardiology

## 2017-03-09 DIAGNOSIS — R0600 Dyspnea, unspecified: Secondary | ICD-10-CM | POA: Insufficient documentation

## 2017-03-09 DIAGNOSIS — R0609 Other forms of dyspnea: Secondary | ICD-10-CM | POA: Insufficient documentation

## 2017-03-09 DIAGNOSIS — R0602 Shortness of breath: Secondary | ICD-10-CM | POA: Insufficient documentation

## 2017-03-09 NOTE — Progress Notes (Signed)
Cardiology Office Note    Date:  03/10/2017   ID:  [name], DOB 1951-07-17, MRN [id number]  PCP:  [name], MD  Cardiologist: Sinclair Grooms, MD   Chief Complaint  Patient presents with  . Coronary Artery Disease    History of Present Illness:  Carolyn Russo is a 66 y.o. female diabetes, hypertension, chronic kidney disease, obesity, referred by Dr. Bryon Lions for evaluation because of an abnormal EKG.  Carolyn Russo had the flu. She was sick for greater than 6 weeks in early January. She had palpitations and atypical chest pain during that time. She recently saw her primary physician who performed an EKG during a yearly exam. It demonstrated abnormalities raising the question of a myocardial infarction. She has no complain of orthopnea although she has always slept on 3 pillows. She has heartburn. She describes heartburn as a sense of burning in her throat. It can occur spontaneously. Not necessarily exertion related.    Past Medical History:  Diagnosis Date  . Anterior chest wall pain   . Appendicitis 1965  . Asthma   . Body mass index 37.0-37.9, adult   . Breast pain   . Chronic renal disease, stage II   . Dehydration 2014  . Deviated septum 1971  . Diabetes mellitus   . Dyspnea 2014  . Extrinsic asthma    WITH ASTHMA ATTACK  . Fibroid 1980  . Hx gestational diabetes   . Hypertension 2014  . Inguinal hernia 1959  . Malaise and fatigue 2014  . Nephropathy   . Non-IgE mediated allergic asthma 2014  . Obesity   . Pelvic pain   . Pregnancy, high-risk 1985  . Tonsillitis 1968  . Uterine fibroid 1980    Past Surgical History:  Procedure Laterality Date  . APPENDECTOMY  1959  . CESAREAN SECTION  1985  . HERNIA REPAIR  1959  . [place], 2004, 2007  . RHINOPLASTY  1971  . ROTATOR CUFF REPAIR  2003  . SURGICAL REPAIR OF HEMORRHAGE  2015  . TONSILLECTOMY  1968    Current Medications: Outpatient Medications Prior to Visit  Medication  Sig Dispense Refill  . albuterol (PROVENTIL HFA;VENTOLIN HFA) 108 (90 BASE) MCG/ACT inhaler Inhale 2 puffs into the lungs every 6 (six) hours as needed for wheezing or shortness of breath.    Marland Kitchen albuterol (PROVENTIL HFA;VENTOLIN HFA) 108 (90 BASE) MCG/ACT inhaler Inhale 2 puffs into the lungs every 4 (four) hours as needed for wheezing or shortness of breath. 1 Inhaler 3  . Besifloxacin HCl (BESIVANCE) 0.6 % SUSP Apply to eye 3 (three) times daily. INSTILL 1 DROP IN [facility] EYE THREE TIMES A DAY    . Cholecalciferol (VITAMIN D3 PO) Take 2,000 Int'l Units by mouth daily.    . [facility] CINNAMON PO Take 250 mg by mouth 2 (two) times daily.     . Cyanocobalamin (VITAMIN B 12 PO) Take 1 tablet by mouth daily.    . fluticasone (FLONASE) 50 MCG/ACT nasal spray Place 2 sprays into the nose daily.    Marland Kitchen glimepiride (AMARYL) 4 MG tablet Take 4 mg by mouth 2 (two) times daily.    Marland Kitchen lactobacillus acidophilus (BACID) TABS Take 1 tablet by mouth daily. PROBIOTIC    . Manganese 10 MG TABS Take 1 tablet by mouth daily.     . milk thistle 175 MG tablet Take 525 mg by mouth daily. Takes 3 daily    . mometasone-formoterol (DULERA) 100-5  MCG/ACT AERO Inhale 2 puffs into the lungs 2 (two) times daily.    Glory Rosebush DELICA LANCETS MISC USE WITH METER TWICE DAILY TESTING 100 each 12  . oxymetazoline (AFRIN) 0.05 % nasal spray Place 2 sprays into the nose 2 (two) times daily as needed for congestion.    . potassium chloride (K-DUR) 10 MEQ tablet Take 10 mEq by mouth daily.    . sitaGLIPtin-metformin (JANUMET) 50-1000 MG tablet Take 1 tablet by mouth 2 (two) times daily with a meal.    . Saxagliptin-Metformin (KOMBIGLYZE XR) 2.02-999 MG TB24 Take by mouth daily.    Marland Kitchen aspirin 325 MG tablet Take 325 mg by mouth daily.    Marland Kitchen azithromycin (ZITHROMAX Z-PAK) 250 MG tablet Take 1 tablet (250 mg total) by mouth daily. Take 2 po qD x 1 day, then 1 po qD x 4 days 6 tablet 0  . benzonatate (TESSALON) 100 MG capsule Take 1 capsule (100 mg  total) by mouth every 8 (eight) hours. 21 capsule 0  . chlorpheniramine-HYDROcodone (TUSSIONEX PENNKINETIC ER) 10-8 MG/5ML SUER Take 5 mLs by mouth at bedtime as needed for cough. 115 mL 0  . levofloxacin (LEVAQUIN) 750 MG tablet Take 1 tablet (750 mg total) by mouth daily. 7 tablet 0  . Linagliptin-Metformin HCl (JENTADUETO) 2.02-999 MG TABS Take 1 tablet by mouth 2 (two) times daily.     . predniSONE (DELTASONE) 50 MG tablet Take 1 tablet (50 mg total) by mouth daily. 5 tablet 0  . Resveratrol 250 MG CAPS Take 1 capsule by mouth daily.    Marland Kitchen UNABLE TO FIND Take 800 mg by mouth 2 (two) times daily. Med Name: Hart Robinsons    . UNABLE TO FIND Take 1 tablet by mouth 2 (two) times daily. Med Name: Ma Hillock ([place])    . UNABLE TO FIND Take 1 tablet by mouth daily. Med Name: Ashwagandha Extract    . valsartan-hydrochlorothiazide (DIOVAN HCT) 160-12.5 MG per tablet Take 1 tablet by mouth daily. 30 tablet 11  . valsartan-hydrochlorothiazide (DIOVAN-HCT) 160-12.5 MG per tablet Take 1 tablet by mouth daily.     No facility-administered medications prior to visit.      Allergies:   Food; Other; Statins; Lobster [shellfish allergy]; Tetracyclines & related; and Wheat bran   Social History   Social History  . Marital status: Married    Spouse name: N/A  . Number of children: N/A  . Years of education: N/A   Social History Main Topics  . Smoking status: Never Smoker  . Smokeless tobacco: Never Used  . Alcohol use No  . Drug use: Unknown  . Sexual activity: Yes    Birth control/ protection: Post-menopausal   Other Topics Concern  . None   Social History Narrative  . None     Family History:  The patient's family history includes Alzheimer's disease in her father; Cancer in her cousin and mother; Diabetes in her cousin and maternal aunt; Psoriasis in her mother.   ROS:   Please see the history of present illness.    Fatigue, snoring, difficulty with balance, recent foot  fracture, dizziness, passing out, vision disturbance, shortness of breath when lying, irregular heartbeat, excessive fatigue.  All other systems reviewed and are negative.   PHYSICAL EXAM:   VS:  BP (!) 166/86 (BP Location: Left Arm)   Pulse 81   Ht 5\' 2"  (8.333 m)   Wt 186 lb 1.9 oz (84.4 kg)   BMI 34.04 kg/m  GEN: Well nourished, well developed, in no acute distress  HEENT: normal  Neck: no JVD, carotid bruits, or masses Cardiac: RRR; no murmurs, rubs, or gallops,no edema  Respiratory:  clear to auscultation bilaterally, normal work of breathing GI: soft, nontender, nondistended, + BS MS: no deformity or atrophy  Skin: warm and dry, no rash Neuro:  Alert and Oriented x 3, Strength and sensation are intact Psych: euthymic mood, full affect  Wt Readings from Last 3 Encounters:  03/10/17 186 lb 1.9 oz (84.4 kg)  11/27/12 187 lb (84.8 kg)  01/02/12 193 lb (87.5 kg)      Studies/Labs Reviewed:   EKG:  EKG  Normal sinus rhythm with left axis deviation and poor wave progression and overall pattern possibly suggestive of anterolateral infarction of uncertain age. The EKG changes are new.  Recent Labs: No results found for requested labs within last 8760 hours.   Lipid Panel    Component Value Date/Time   CHOL 176 10/13/2011 0903   TRIG 138 10/13/2011 0903   HDL 37 (L) 10/13/2011 0903   CHOLHDL 4.8 10/13/2011 0903   VLDL 28 10/13/2011 0903   LDLCALC 111 (H) 10/13/2011 0903    Additional studies/ records that were reviewed today include:      ASSESSMENT:    1. Dyspnea on exertion   2. Abnormal EKG   3. HTN, goal below 130/80   4. Type 2 diabetes mellitus with other circulatory complication, without long-term current use of insulin (HCC)      PLAN:  In order of problems listed above:  1. We need to assess LV function. This will be done as part of the [employer]. 2. Lexiscan Myoview to exclude perfusion abnormality and assess LV function. 3. Target  blood pressure is 130/90 mmHg or less. 4. Hemoglobin A1c target less than 7 5. LDL target less than 70.  The EKG is concerning and perhaps the dyspnea is an anginal equivalent. A myocardial perfusion study to exclude CAD is indicated. We will perform this and further workup will be based upon findings.  Medication Adjustments/Labs and Tests Ordered: Current medicines are reviewed at length with the patient today.  Concerns regarding medicines are outlined above.  Medication changes, Labs and Tests ordered today are listed in the Patient Instructions below. Patient Instructions  Medication Instructions:  1) INCREASE Diovan to 320/12.5mg  once daily.  Labwork: None  Testing/Procedures: Your physician has requested that you have a lexiscan myoview. For further information please visit [email or web address]. Please follow instruction sheet, as given.   Follow-Up: Your physician recommends that you schedule a follow-up appointment as needed with Dr. Tamala Julian.    Any Other Special Instructions Will Be Listed Below (If Applicable).     If you need a refill on your cardiac medications before your next appointment, please call your pharmacy.      Signed, Sinclair Grooms, MD  03/10/2017 10:51 AM    [facility] [name], [place], [place]  [address] Phone: [phone]; Fax: [phone]

## 2017-03-10 ENCOUNTER — Ambulatory Visit (INDEPENDENT_AMBULATORY_CARE_PROVIDER_SITE_OTHER): Payer: Medicare Other | Admitting: Interventional Cardiology

## 2017-03-10 ENCOUNTER — Encounter (INDEPENDENT_AMBULATORY_CARE_PROVIDER_SITE_OTHER): Payer: Self-pay

## 2017-03-10 ENCOUNTER — Encounter: Payer: Self-pay | Admitting: Interventional Cardiology

## 2017-03-10 VITALS — BP 166/86 | HR 81 | Ht 62.0 in | Wt 186.1 lb

## 2017-03-10 DIAGNOSIS — R0609 Other forms of dyspnea: Secondary | ICD-10-CM | POA: Diagnosis not present

## 2017-03-10 DIAGNOSIS — R9431 Abnormal electrocardiogram [ECG] [EKG]: Secondary | ICD-10-CM

## 2017-03-10 DIAGNOSIS — E785 Hyperlipidemia, unspecified: Secondary | ICD-10-CM

## 2017-03-10 DIAGNOSIS — R0600 Dyspnea, unspecified: Secondary | ICD-10-CM

## 2017-03-10 DIAGNOSIS — I10 Essential (primary) hypertension: Secondary | ICD-10-CM | POA: Diagnosis not present

## 2017-03-10 DIAGNOSIS — E1159 Type 2 diabetes mellitus with other circulatory complications: Secondary | ICD-10-CM | POA: Diagnosis not present

## 2017-03-10 MED ORDER — VALSARTAN-HYDROCHLOROTHIAZIDE 320-12.5 MG PO TABS: 1.0000 | ORAL_TABLET | Freq: Every day | ORAL | 3 refills | Status: DC

## 2017-03-10 NOTE — Patient Instructions (Signed)
Medication Instructions:  1) INCREASE Diovan to 320/12.5mg  once daily.  Labwork: None  Testing/Procedures: Your physician has requested that you have a lexiscan myoview. For further information please visit [email or web address]. Please follow instruction sheet, as given.   Follow-Up: Your physician recommends that you schedule a follow-up appointment as needed with Dr. Tamala Julian.    Any Other Special Instructions Will Be Listed Below (If Applicable).     If you need a refill on your cardiac medications before your next appointment, please call your pharmacy.

## 2017-03-14 ENCOUNTER — Telehealth (HOSPITAL_COMMUNITY): Payer: Self-pay | Admitting: *Deleted

## 2017-03-14 NOTE — Telephone Encounter (Signed)
Patient given detailed instructions per Myocardial Perfusion Study Information Sheet for the test on 03/17/17 at 0745. Patient notified to arrive 15 minutes early and that it is imperative to arrive on time for appointment to keep from having the test rescheduled.  If you need to cancel or reschedule your appointment, please call the office within 24 hours of your appointment. . Patient verbalized understanding.Aurther Harlin, Ranae Palms

## 2017-03-15 ENCOUNTER — Telehealth (HOSPITAL_COMMUNITY): Payer: Self-pay | Admitting: *Deleted

## 2017-03-15 NOTE — Telephone Encounter (Signed)
Left message on voicemail in reference to upcoming appointment scheduled for 03/17/17. Phone number given for a call back so details instructions can be given.  Carolyn Russo

## 2017-03-17 ENCOUNTER — Ambulatory Visit (HOSPITAL_COMMUNITY): Payer: Medicare Other | Attending: Cardiology

## 2017-03-17 DIAGNOSIS — E119 Type 2 diabetes mellitus without complications: Secondary | ICD-10-CM | POA: Insufficient documentation

## 2017-03-17 DIAGNOSIS — I10 Essential (primary) hypertension: Secondary | ICD-10-CM | POA: Insufficient documentation

## 2017-03-17 DIAGNOSIS — R9431 Abnormal electrocardiogram [ECG] [EKG]: Secondary | ICD-10-CM

## 2017-03-17 DIAGNOSIS — I519 Heart disease, unspecified: Secondary | ICD-10-CM | POA: Diagnosis not present

## 2017-03-17 IMAGING — NM NM MISC PROCEDURE
9 series · 54 of 54 positions shown · non-contrast
Comparison: none

[Series 1: stress · 6.51mm/px · 6 of 64 frames shown (1 of 2)]
[frame 6/64]
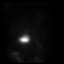
[frame 16/64]
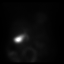
[frame 27/64]
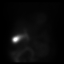
[frame 38/64]
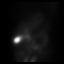
[frame 48/64]
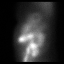
[frame 59/64]
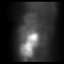

[Series 1: wbr_r-proj_st rest_(id)_sa · 6.5mm · 6.51mm/px · 6 of 64 frames shown]
[frame 6/64]
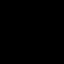
[frame 16/64]
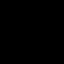
[frame 27/64]
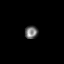
[frame 38/64]
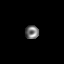
[frame 48/64]
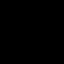
[frame 59/64]
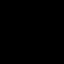

[Series 1: wbr_s-proj_st stress_(id)_sa · 6.5mm · 6.51mm/px · 6 of 512 frames shown (1 of 2)]
[frame 43/512]
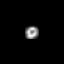
[frame 128/512]
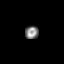
[frame 214/512]
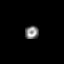
[frame 299/512]
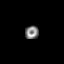
[frame 384/512]
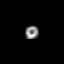
[frame 470/512]
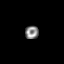

[Series 1: wbr_s-proj_st stress · 6.51mm/px · 6 of 512 frames shown (1 of 2)]
[frame 43/512]
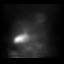
[frame 128/512]
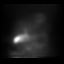
[frame 214/512]
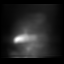
[frame 299/512]
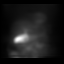
[frame 384/512]
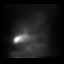
[frame 470/512]
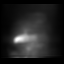

[Series 1: wbr_s-proj_st stress · 6.51mm/px · 6 of 64 frames shown (2 of 2)]
[frame 6/64]
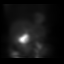
[frame 16/64]
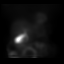
[frame 27/64]
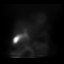
[frame 38/64]
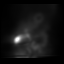
[frame 48/64]
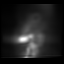
[frame 59/64]
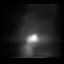

[Series 1: wbr_s-proj_st stress_(id)_sa · 6.5mm · 6.51mm/px · 6 of 64 frames shown (2 of 2)]
[frame 6/64]
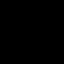
[frame 16/64]
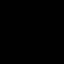
[frame 27/64]
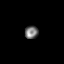
[frame 38/64]
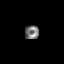
[frame 48/64]
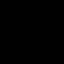
[frame 59/64]
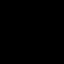

[Series 1: stress · 6.51mm/px · 6 of 512 frames shown (2 of 2)]
[frame 43/512]
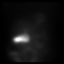
[frame 128/512]
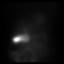
[frame 214/512]
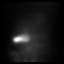
[frame 299/512]
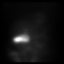
[frame 384/512]
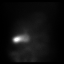
[frame 470/512]
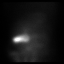

[Series 2: rest · 6.51mm/px · 6 of 64 frames shown]
[frame 6/64]
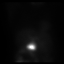
[frame 16/64]
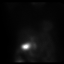
[frame 27/64]
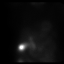
[frame 38/64]
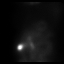
[frame 48/64]
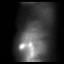
[frame 59/64]
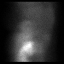

[Series 2: wbr_r-proj_st rest · 6.51mm/px · 6 of 64 frames shown]
[frame 6/64]
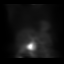
[frame 16/64]
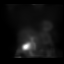
[frame 27/64]
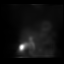
[frame 38/64]
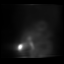
[frame 48/64]
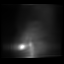
[frame 59/64]
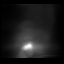

[54 of 54 positions shown; findings below may reference images not displayed]

Canned report from images found in remote index.

Refer to host system for actual result text.

## 2017-03-17 MED ORDER — TECHNETIUM TC 99M TETROFOSMIN IV KIT
32.8000 | PACK | Freq: Once | INTRAVENOUS | Status: AC | PRN
  Administered 2017-03-17: 32.8 via INTRAVENOUS
  Filled 2017-03-17: qty 33

## 2017-03-17 MED ORDER — REGADENOSON 0.4 MG/5ML IV SOLN
0.4000 mg | Freq: Once | INTRAVENOUS | Status: AC
  Administered 2017-03-17: 0.4 mg via INTRAVENOUS

## 2017-03-21 ENCOUNTER — Ambulatory Visit (HOSPITAL_COMMUNITY): Payer: Medicare Other | Attending: Cardiovascular Disease

## 2017-03-21 LAB — MYOCARDIAL PERFUSION IMAGING
LV dias vol: 73 mL (ref 46–106)
LV sys vol: 25 mL
Peak HR: 102 {beats}/min
RATE: 0.28
Rest HR: 77 {beats}/min
SDS: 8
SRS: 4
SSS: 12
TID: 0.91

## 2017-03-21 MED ORDER — TECHNETIUM TC 99M TETROFOSMIN IV KIT
32.5000 | PACK | Freq: Once | INTRAVENOUS | Status: AC | PRN
  Administered 2017-03-21: 32.5 via INTRAVENOUS
  Filled 2017-03-21: qty 33

## 2017-03-22 ENCOUNTER — Encounter: Payer: Self-pay | Admitting: *Deleted

## 2017-03-25 ENCOUNTER — Other Ambulatory Visit: Payer: Self-pay | Admitting: Interventional Cardiology

## 2017-03-25 DIAGNOSIS — I2583 Coronary atherosclerosis due to lipid rich plaque: Principal | ICD-10-CM

## 2017-03-25 DIAGNOSIS — I2510 Atherosclerotic heart disease of native coronary artery without angina pectoris: Secondary | ICD-10-CM

## 2017-03-28 ENCOUNTER — Telehealth: Payer: Self-pay | Admitting: *Deleted

## 2017-03-28 DIAGNOSIS — R9431 Abnormal electrocardiogram [ECG] [EKG]: Secondary | ICD-10-CM

## 2017-03-28 DIAGNOSIS — I10 Essential (primary) hypertension: Secondary | ICD-10-CM

## 2017-03-28 DIAGNOSIS — R0609 Other forms of dyspnea: Secondary | ICD-10-CM

## 2017-03-28 DIAGNOSIS — E785 Hyperlipidemia, unspecified: Secondary | ICD-10-CM

## 2017-03-28 DIAGNOSIS — Z01812 Encounter for preprocedural laboratory examination: Secondary | ICD-10-CM

## 2017-03-28 DIAGNOSIS — R0600 Dyspnea, unspecified: Secondary | ICD-10-CM

## 2017-03-28 MED ORDER — ROSUVASTATIN CALCIUM 5 MG PO TABS: 5.0000 mg | ORAL_TABLET | Freq: Every day | ORAL | 3 refills | Status: DC

## 2017-03-28 NOTE — Telephone Encounter (Signed)
Follow up    Pt is calling back for [facility].

## 2017-03-28 NOTE — Telephone Encounter (Signed)
Left message to call back  Also need to go over cath instructions and schedule cath labs.

## 2017-03-28 NOTE — Telephone Encounter (Signed)
Left message to call back  

## 2017-03-28 NOTE — Telephone Encounter (Signed)
-----   Message from Belva Crome, MD sent at 03/25/2017  9:44 AM EDT ----- Regarding: Elevated lipids Please start Rosuvastatin 5 mg daily Liver and lipid in 6 weeks.

## 2017-03-28 NOTE — Telephone Encounter (Signed)
Spoke with pt and went over recommendations per Dr. Tamala Julian.  Went over cath instructions as well.  Pt will come 03/31/17 for cath labs.  Pt agreeable to Rosuvastatin and will come 05/08/17 for fasting labs.  Advised pt to pick up cath instructions when she comes for labs on Friday.  Scheduled pt for cath f/u on 04/28/17.  Pt verbalized understanding and was in agreement with plan.

## 2017-03-31 ENCOUNTER — Other Ambulatory Visit (INDEPENDENT_AMBULATORY_CARE_PROVIDER_SITE_OTHER): Payer: Medicare Other | Admitting: *Deleted

## 2017-03-31 DIAGNOSIS — R0609 Other forms of dyspnea: Secondary | ICD-10-CM | POA: Diagnosis not present

## 2017-03-31 DIAGNOSIS — I129 Hypertensive chronic kidney disease with stage 1 through stage 4 chronic kidney disease, or unspecified chronic kidney disease: Secondary | ICD-10-CM | POA: Diagnosis not present

## 2017-03-31 DIAGNOSIS — Z01812 Encounter for preprocedural laboratory examination: Secondary | ICD-10-CM

## 2017-03-31 DIAGNOSIS — I10 Essential (primary) hypertension: Secondary | ICD-10-CM | POA: Diagnosis not present

## 2017-03-31 DIAGNOSIS — R0600 Dyspnea, unspecified: Secondary | ICD-10-CM

## 2017-03-31 DIAGNOSIS — E1122 Type 2 diabetes mellitus with diabetic chronic kidney disease: Secondary | ICD-10-CM | POA: Diagnosis not present

## 2017-03-31 DIAGNOSIS — N182 Chronic kidney disease, stage 2 (mild): Secondary | ICD-10-CM | POA: Diagnosis not present

## 2017-03-31 DIAGNOSIS — N08 Glomerular disorders in diseases classified elsewhere: Secondary | ICD-10-CM | POA: Diagnosis not present

## 2017-03-31 LAB — BASIC METABOLIC PANEL
BUN/Creatinine Ratio: 20 (ref 12–28)
BUN: 25 mg/dL (ref 8–27)
CO2: 20 mmol/L (ref 18–29)
Calcium: 10 mg/dL (ref 8.7–10.3)
Chloride: 104 mmol/L (ref 96–106)
Creatinine, Ser: 1.22 mg/dL — ABNORMAL HIGH (ref 0.57–1.00)
GFR calc Af Amer: 53 mL/min/{1.73_m2} — ABNORMAL LOW (ref >59)
GFR calc non Af Amer: 46 mL/min/{1.73_m2} — ABNORMAL LOW (ref >59)
Glucose: 228 mg/dL — ABNORMAL HIGH (ref 65–99)
Potassium: 4.1 mmol/L (ref 3.5–5.2)
Sodium: 142 mmol/L (ref 134–144)

## 2017-03-31 LAB — PROTIME-INR
INR: 1 (ref 0.8–1.2)
Prothrombin Time: 10.3 s (ref 9.1–12.0)

## 2017-03-31 LAB — CBC
Hematocrit: 29.5 % — ABNORMAL LOW (ref 34.0–46.6)
Hemoglobin: 10.4 g/dL — ABNORMAL LOW (ref 11.1–15.9)
MCH: 29.5 pg (ref 26.6–33.0)
MCHC: 35.3 g/dL (ref 31.5–35.7)
MCV: 84 fL (ref 79–97)
Platelets: 322 10*3/uL (ref 150–379)
RBC: 3.53 x10E6/uL — ABNORMAL LOW (ref 3.77–5.28)
RDW: 13.7 % (ref 12.3–15.4)
WBC: 8.2 10*3/uL (ref 3.4–10.8)

## 2017-04-03 ENCOUNTER — Telehealth: Payer: Self-pay

## 2017-04-03 NOTE — Telephone Encounter (Signed)
Patient contacted pre-catheterization at [facility] scheduled for: 04/04/2017 @ 0730 Verified arrival time and place:  Yes-0530 Confirmed AM meds to be taken pre-cath with sip of water:  Pt to hold Janumet starting today and for 48 hours post cath.  Pt to hold amaryll and diovan morning of procedure.  Pt states she no longer takes kombiglyze or Jentadueto (both contain metformin).   Confirmed patient has responsible person to drive home post procedure and observe patient for 24 hours:  Husband.   Pt indicates understanding of medication regimen pre cath.

## 2017-04-04 ENCOUNTER — Ambulatory Visit (HOSPITAL_COMMUNITY)
Admission: RE | Admit: 2017-04-04 | Discharge: 2017-04-04 | Disposition: A | Discharge status: Home / Self Care | Payer: Medicare Other | Source: Ambulatory Visit | Attending: Interventional Cardiology | Admitting: Interventional Cardiology

## 2017-04-04 ENCOUNTER — Encounter (HOSPITAL_COMMUNITY)
Admission: RE | Disposition: A | Discharge status: Home / Self Care | Payer: Self-pay | Source: Ambulatory Visit | Attending: Interventional Cardiology

## 2017-04-04 DIAGNOSIS — Z7952 Long term (current) use of systemic steroids: Secondary | ICD-10-CM | POA: Insufficient documentation

## 2017-04-04 DIAGNOSIS — Z7984 Long term (current) use of oral hypoglycemic drugs: Secondary | ICD-10-CM | POA: Diagnosis not present

## 2017-04-04 DIAGNOSIS — I129 Hypertensive chronic kidney disease with stage 1 through stage 4 chronic kidney disease, or unspecified chronic kidney disease: Secondary | ICD-10-CM | POA: Insufficient documentation

## 2017-04-04 DIAGNOSIS — I2583 Coronary atherosclerosis due to lipid rich plaque: Secondary | ICD-10-CM

## 2017-04-04 DIAGNOSIS — J45909 Unspecified asthma, uncomplicated: Secondary | ICD-10-CM | POA: Insufficient documentation

## 2017-04-04 DIAGNOSIS — R9439 Abnormal result of other cardiovascular function study: Secondary | ICD-10-CM

## 2017-04-04 DIAGNOSIS — N182 Chronic kidney disease, stage 2 (mild): Secondary | ICD-10-CM | POA: Insufficient documentation

## 2017-04-04 DIAGNOSIS — I2584 Coronary atherosclerosis due to calcified coronary lesion: Secondary | ICD-10-CM | POA: Insufficient documentation

## 2017-04-04 DIAGNOSIS — I2510 Atherosclerotic heart disease of native coronary artery without angina pectoris: Secondary | ICD-10-CM | POA: Diagnosis not present

## 2017-04-04 DIAGNOSIS — E1121 Type 2 diabetes mellitus with diabetic nephropathy: Secondary | ICD-10-CM | POA: Diagnosis not present

## 2017-04-04 DIAGNOSIS — E119 Type 2 diabetes mellitus without complications: Secondary | ICD-10-CM

## 2017-04-04 DIAGNOSIS — Z6834 Body mass index (BMI) 34.0-34.9, adult: Secondary | ICD-10-CM | POA: Insufficient documentation

## 2017-04-04 DIAGNOSIS — R0600 Dyspnea, unspecified: Secondary | ICD-10-CM | POA: Diagnosis present

## 2017-04-04 DIAGNOSIS — R0609 Other forms of dyspnea: Secondary | ICD-10-CM | POA: Diagnosis present

## 2017-04-04 DIAGNOSIS — I10 Essential (primary) hypertension: Secondary | ICD-10-CM | POA: Diagnosis present

## 2017-04-04 DIAGNOSIS — R0602 Shortness of breath: Secondary | ICD-10-CM | POA: Diagnosis present

## 2017-04-04 DIAGNOSIS — Z7951 Long term (current) use of inhaled steroids: Secondary | ICD-10-CM | POA: Diagnosis not present

## 2017-04-04 DIAGNOSIS — E785 Hyperlipidemia, unspecified: Secondary | ICD-10-CM | POA: Diagnosis present

## 2017-04-04 DIAGNOSIS — Z91013 Allergy to seafood: Secondary | ICD-10-CM | POA: Diagnosis not present

## 2017-04-04 DIAGNOSIS — E669 Obesity, unspecified: Secondary | ICD-10-CM | POA: Insufficient documentation

## 2017-04-04 DIAGNOSIS — Z7982 Long term (current) use of aspirin: Secondary | ICD-10-CM | POA: Diagnosis not present

## 2017-04-04 DIAGNOSIS — E1122 Type 2 diabetes mellitus with diabetic chronic kidney disease: Secondary | ICD-10-CM | POA: Insufficient documentation

## 2017-04-04 DIAGNOSIS — R9431 Abnormal electrocardiogram [ECG] [EKG]: Secondary | ICD-10-CM | POA: Diagnosis present

## 2017-04-04 HISTORY — PX: LEFT HEART CATH AND CORONARY ANGIOGRAPHY: CATH118249

## 2017-04-04 LAB — GLUCOSE, CAPILLARY
Glucose-Capillary: 361 mg/dL — ABNORMAL HIGH (ref 65–99)
Glucose-Capillary: 345 mg/dL — ABNORMAL HIGH (ref 65–99)
Glucose-Capillary: 278 mg/dL — ABNORMAL HIGH (ref 65–99)
Glucose-Capillary: 148 mg/dL — ABNORMAL HIGH (ref 65–99)

## 2017-04-04 SURGERY — LEFT HEART CATH AND CORONARY ANGIOGRAPHY
Anesthesia: LOCAL

## 2017-04-04 MED ORDER — MIDAZOLAM HCL 2 MG/2ML IJ SOLN
INTRAMUSCULAR | Status: DC | PRN
  Administered 2017-04-04: 1 mg via INTRAVENOUS

## 2017-04-04 MED ORDER — SODIUM CHLORIDE 0.9 % WEIGHT BASED INFUSION
3.0000 mL/kg/h | INTRAVENOUS | Status: AC
  Administered 2017-04-04: 3 mL/kg/h via INTRAVENOUS

## 2017-04-04 MED ORDER — SODIUM CHLORIDE 0.9 % WEIGHT BASED INFUSION: 1.0000 mL/kg/h | INTRAVENOUS | Status: DC

## 2017-04-04 MED ORDER — INSULIN ASPART 100 UNIT/ML ~~LOC~~ SOLN
SUBCUTANEOUS | Status: AC
  Filled 2017-04-04: qty 1

## 2017-04-04 MED ORDER — SODIUM CHLORIDE 0.9% FLUSH: 3.0000 mL | Freq: Two times a day (BID) | INTRAVENOUS | Status: DC

## 2017-04-04 MED ORDER — METHYLPREDNISOLONE SODIUM SUCC 125 MG IJ SOLR
INTRAMUSCULAR | Status: AC
  Filled 2017-04-04: qty 2

## 2017-04-04 MED ORDER — SODIUM CHLORIDE 0.9% FLUSH: 3.0000 mL | INTRAVENOUS | Status: DC | PRN

## 2017-04-04 MED ORDER — HEPARIN SODIUM (PORCINE) 1000 UNIT/ML IJ SOLN
INTRAMUSCULAR | Status: AC
  Filled 2017-04-04: qty 1

## 2017-04-04 MED ORDER — INSULIN ASPART 100 UNIT/ML ~~LOC~~ SOLN
SUBCUTANEOUS | Status: AC
  Administered 2017-04-04: 6 [IU] via SUBCUTANEOUS
  Filled 2017-04-04: qty 1

## 2017-04-04 MED ORDER — INSULIN ASPART 100 UNIT/ML ~~LOC~~ SOLN
0.0000 [IU] | Freq: Three times a day (TID) | SUBCUTANEOUS | Status: DC
  Administered 2017-04-04: 1 [IU] via SUBCUTANEOUS

## 2017-04-04 MED ORDER — DIPHENHYDRAMINE HCL 50 MG/ML IJ SOLN
INTRAMUSCULAR | Status: AC
  Filled 2017-04-04: qty 1

## 2017-04-04 MED ORDER — HYDRALAZINE HCL 20 MG/ML IJ SOLN
10.0000 mg | Freq: Once | INTRAMUSCULAR | Status: AC
  Administered 2017-04-04: 10 mg via INTRAVENOUS

## 2017-04-04 MED ORDER — HYDRALAZINE HCL 20 MG/ML IJ SOLN
INTRAMUSCULAR | Status: AC
  Administered 2017-04-04: 10 mg via INTRAVENOUS
  Filled 2017-04-04: qty 1

## 2017-04-04 MED ORDER — FENTANYL CITRATE (PF) 100 MCG/2ML IJ SOLN
INTRAMUSCULAR | Status: AC
  Filled 2017-04-04: qty 2

## 2017-04-04 MED ORDER — METHYLPREDNISOLONE SODIUM SUCC 125 MG IJ SOLR
125.0000 mg | Freq: Once | INTRAMUSCULAR | Status: AC
Start: 2017-04-04 — End: 2017-04-04
  Administered 2017-04-04: 125 mg via INTRAVENOUS

## 2017-04-04 MED ORDER — INSULIN ASPART 100 UNIT/ML ~~LOC~~ SOLN
6.0000 [IU] | Freq: Once | SUBCUTANEOUS | Status: AC
  Administered 2017-04-04: 6 [IU] via SUBCUTANEOUS
  Filled 2017-04-04: qty 0.06

## 2017-04-04 MED ORDER — IOPAMIDOL (ISOVUE-370) INJECTION 76%
INTRAVENOUS | Status: DC | PRN
  Administered 2017-04-04: 70 mL via INTRA_ARTERIAL

## 2017-04-04 MED ORDER — IOPAMIDOL (ISOVUE-370) INJECTION 76%
INTRAVENOUS | Status: AC
  Filled 2017-04-04: qty 100

## 2017-04-04 MED ORDER — AMLODIPINE BESYLATE 5 MG PO TABS
ORAL_TABLET | ORAL | Status: AC
  Administered 2017-04-04: 5 mg via ORAL
  Filled 2017-04-04: qty 1

## 2017-04-04 MED ORDER — HEPARIN SODIUM (PORCINE) 1000 UNIT/ML IJ SOLN
INTRAMUSCULAR | Status: DC | PRN
  Administered 2017-04-04: 4000 [IU] via INTRAVENOUS

## 2017-04-04 MED ORDER — ONDANSETRON HCL 4 MG/2ML IJ SOLN: 4.0000 mg | Freq: Four times a day (QID) | INTRAMUSCULAR | Status: DC | PRN

## 2017-04-04 MED ORDER — SODIUM CHLORIDE 0.9 % IV SOLN: INTRAVENOUS | Status: AC

## 2017-04-04 MED ORDER — AMLODIPINE BESYLATE 5 MG PO TABS
5.0000 mg | ORAL_TABLET | Freq: Once | ORAL | Status: AC
  Administered 2017-04-04: 5 mg via ORAL

## 2017-04-04 MED ORDER — SODIUM CHLORIDE 0.9 % IV SOLN: 250.0000 mL | INTRAVENOUS | Status: DC | PRN

## 2017-04-04 MED ORDER — VERAPAMIL HCL 2.5 MG/ML IV SOLN
INTRAVENOUS | Status: DC | PRN
  Administered 2017-04-04: 10 mL via INTRA_ARTERIAL

## 2017-04-04 MED ORDER — ACETAMINOPHEN 325 MG PO TABS: 650.0000 mg | ORAL_TABLET | ORAL | Status: DC | PRN

## 2017-04-04 MED ORDER — HYDRALAZINE HCL 20 MG/ML IJ SOLN
INTRAMUSCULAR | Status: DC | PRN
  Administered 2017-04-04: 10 mg via INTRAVENOUS

## 2017-04-04 MED ORDER — FAMOTIDINE IN NACL 20-0.9 MG/50ML-% IV SOLN
INTRAVENOUS | Status: AC
  Filled 2017-04-04: qty 50

## 2017-04-04 MED ORDER — VERAPAMIL HCL 2.5 MG/ML IV SOLN
INTRAVENOUS | Status: AC
  Filled 2017-04-04: qty 2

## 2017-04-04 MED ORDER — HYDRALAZINE HCL 20 MG/ML IJ SOLN
INTRAMUSCULAR | Status: AC
  Filled 2017-04-04: qty 1

## 2017-04-04 MED ORDER — HEPARIN (PORCINE) IN NACL 2-0.9 UNIT/ML-% IJ SOLN
INTRAMUSCULAR | Status: AC | PRN
  Administered 2017-04-04: 1000 mL

## 2017-04-04 MED ORDER — FENTANYL CITRATE (PF) 100 MCG/2ML IJ SOLN
INTRAMUSCULAR | Status: DC | PRN
Start: 2017-04-04 — End: 2017-04-04
  Administered 2017-04-04: 50 ug via INTRAVENOUS

## 2017-04-04 MED ORDER — MIDAZOLAM HCL 2 MG/2ML IJ SOLN
INTRAMUSCULAR | Status: AC
  Filled 2017-04-04: qty 2

## 2017-04-04 MED ORDER — LIDOCAINE HCL (PF) 1 % IJ SOLN
INTRAMUSCULAR | Status: DC | PRN
  Administered 2017-04-04: 2 mL via SUBCUTANEOUS

## 2017-04-04 MED ORDER — ASPIRIN 81 MG PO CHEW: 81.0000 mg | CHEWABLE_TABLET | ORAL | Status: DC

## 2017-04-04 MED ORDER — LIDOCAINE HCL 1 % IJ SOLN
INTRAMUSCULAR | Status: AC
  Filled 2017-04-04: qty 20

## 2017-04-04 MED ORDER — HEPARIN (PORCINE) IN NACL 2-0.9 UNIT/ML-% IJ SOLN
INTRAMUSCULAR | Status: AC
  Filled 2017-04-04: qty 1000

## 2017-04-04 MED ORDER — FAMOTIDINE IN NACL 20-0.9 MG/50ML-% IV SOLN
20.0000 mg | Freq: Once | INTRAVENOUS | Status: AC
  Administered 2017-04-04: 20 mg via INTRAVENOUS

## 2017-04-04 MED ORDER — DIPHENHYDRAMINE HCL 50 MG/ML IJ SOLN
25.0000 mg | Freq: Once | INTRAMUSCULAR | Status: AC
  Administered 2017-04-04: 25 mg via INTRAVENOUS

## 2017-04-04 MED ORDER — INSULIN ASPART 100 UNIT/ML ~~LOC~~ SOLN
10.0000 [IU] | Freq: Once | SUBCUTANEOUS | Status: AC
  Administered 2017-04-04: 10 [IU] via SUBCUTANEOUS
  Filled 2017-04-04: qty 0.1

## 2017-04-04 SURGICAL SUPPLY — 13 items
CATH INFINITI 5 FR JL3.5 (CATHETERS) ×1 IMPLANT
CATH INFINITI JR4 5F (CATHETERS) ×1 IMPLANT
COVER PRB 48X5XTLSCP FOLD TPE (BAG) IMPLANT
COVER PROBE 5X48 (BAG) ×2
DEVICE RAD COMP TR BAND LRG (VASCULAR PRODUCTS) ×1 IMPLANT
GLIDESHEATH SLEND A-KIT 6F 22G (SHEATH) ×1 IMPLANT
GLIDESHEATH SLEND SS 6F .021 (SHEATH) IMPLANT
GUIDEWIRE INQWIRE 1.5J.035X260 (WIRE) IMPLANT
INQWIRE 1.5J .035X260CM (WIRE) ×2
KIT HEART LEFT (KITS) ×2 IMPLANT
PACK CARDIAC CATHETERIZATION (CUSTOM PROCEDURE TRAY) ×2 IMPLANT
TRANSDUCER W/STOPCOCK (MISCELLANEOUS) ×2 IMPLANT
TUBING CIL FLEX 10 FLL-RA (TUBING) ×2 IMPLANT

## 2017-04-04 NOTE — Interval H&P Note (Signed)
History and Physical Interval Note:  04/04/2017 7:01 AM The H and P is as above. After office visit nuclear stress test was abnormal: Study Highlights     The left ventricular ejection fraction is normal (55-65%).  Nuclear stress EF: 65%.  No T wave inversion was noted during stress.  There was no ST segment deviation noted during stress.  Defect 1: There is a medium defect of moderate severity.  Findings consistent with ischemia.  This is an intermediate risk study.   Medium size, moderate intensity reversible (SDS 8) anterior, anteroseptal and anterolateral perfusion defect suggestive of ischemia or less likely shifting breast attenuation artifact. LVEF 65% with normal wall motion. This is an intermediate risk study.   She was counciled to undergo coronary angiography to define anatomy and help guide therapy.  Carolyn Russo  has presented today for surgery, with the diagnosis of abnormal stress test  The various methods of treatment have been discussed with the patient and family. After consideration of risks, benefits and other options for treatment, the patient has consented to  Procedure(s): Left Heart Cath and Coronary Angiography (N/A) as a surgical intervention .  The patient's history has been reviewed, patient examined, no change in status, stable for surgery.  I have reviewed the patient's chart and labs.  Questions were answered to the patient's satisfaction.     Belva Crome III

## 2017-04-04 NOTE — Progress Notes (Signed)
Patient discharge with her husband to home with no complaints and in stable condition with radail site unremarkable.

## 2017-04-04 NOTE — Discharge Instructions (Signed)
HOLD JANUMET UNTIL 04/07/17   Radial Site Care Refer to this sheet in the next few weeks. These instructions provide you with information about caring for yourself after your procedure. Your health care provider may also give you more specific instructions. Your treatment has been planned according to current medical practices, but problems sometimes occur. Call your health care provider if you have any problems or questions after your procedure. What can I expect after the procedure? After your procedure, it is typical to have the following:  Bruising at the radial site that usually fades within 1-2 weeks.  Blood collecting in the tissue (hematoma) that may be painful to the touch. It should usually decrease in size and tenderness within 1-2 weeks.  Follow these instructions at home:  Take medicines only as directed by your health care provider.  You may shower 24-48 hours after the procedure or as directed by your health care provider. Remove the bandage (dressing) and gently wash the site with plain soap and water. Pat the area dry with a clean towel. Do not rub the site, because this may cause bleeding.  Do not take baths, swim, or use a hot tub until your health care provider approves.  Check your insertion site every day for redness, swelling, or drainage.  Do not apply powder or lotion to the site.  Do not flex or bend the affected arm for 24 hours or as directed by your health care provider.  Do not push or pull heavy objects with the affected arm for 24 hours or as directed by your health care provider.  Do not lift over 10 lb (4.5 kg) for 5 days after your procedure or as directed by your health care provider.  Ask your health care provider when it is okay to: ? Return to work or school. ? Resume usual physical activities or sports. ? Resume sexual activity.  Do not drive home if you are discharged the same day as the procedure. Have someone else drive you.  You may  drive 24 hours after the procedure unless otherwise instructed by your health care provider.  Do not operate machinery or power tools for 24 hours after the procedure.  If your procedure was done as an outpatient procedure, which means that you went home the same day as your procedure, a responsible adult should be with you for the first 24 hours after you arrive home.  Keep all follow-up visits as directed by your health care provider. This is important. Contact a health care provider if:  You have a fever.  You have chills.  You have increased bleeding from the radial site. Hold pressure on the site. Get help right away if:  You have unusual pain at the radial site.  You have redness, warmth, or swelling at the radial site.  You have drainage (other than a small amount of blood on the dressing) from the radial site.  The radial site is bleeding, and the bleeding does not stop after 30 minutes of holding steady pressure on the site.  Your arm or hand becomes pale, cool, tingly, or numb. This information is not intended to replace advice given to you by your health care provider. Make sure you discuss any questions you have with your health care provider. Document Released: 11/12/2010 Document Revised: 03/17/2016 Document Reviewed: 04/28/2014 Elsevier Interactive Patient Education  2018 [employer].

## 2017-04-04 NOTE — H&P (View-Only) (Signed)
Cardiology Office Note    Date:  03/10/2017   ID:  Carolyn Russo, DOB 1951/10/02, MRN [id number]  PCP:  [name], MD  Cardiologist: Sinclair Grooms, MD   Chief Complaint  Patient presents with  . Coronary Artery Disease    History of Present Illness:  Carolyn Russo is a 66 y.o. female diabetes, hypertension, chronic kidney disease, obesity, referred by Dr. Bryon Lions for evaluation because of an abnormal EKG.  Carolyn Russo had the flu. She was sick for greater than 6 weeks in early January. She had palpitations and atypical chest pain during that time. She recently saw her primary physician who performed an EKG during a yearly exam. It demonstrated abnormalities raising the question of a myocardial infarction. She has no complain of orthopnea although she has always slept on 3 pillows. She has heartburn. She describes heartburn as a sense of burning in her throat. It can occur spontaneously. Not necessarily exertion related.    Past Medical History:  Diagnosis Date  . Anterior chest wall pain   . Appendicitis 1965  . Asthma   . Body mass index 37.0-37.9, adult   . Breast pain   . Chronic renal disease, stage II   . Dehydration 2014  . Deviated septum 1971  . Diabetes mellitus   . Dyspnea 2014  . Extrinsic asthma    WITH ASTHMA ATTACK  . Fibroid 1980  . Hx gestational diabetes   . Hypertension 2014  . Inguinal hernia 1959  . Malaise and fatigue 2014  . Nephropathy   . Non-IgE mediated allergic asthma 2014  . Obesity   . Pelvic pain   . Pregnancy, high-risk 1985  . Tonsillitis 1968  . Uterine fibroid 1980    Past Surgical History:  Procedure Laterality Date  . APPENDECTOMY  1959  . CESAREAN SECTION  1985  . HERNIA REPAIR  1959  . [place], 2004, 2007  . RHINOPLASTY  1971  . ROTATOR CUFF REPAIR  2003  . SURGICAL REPAIR OF HEMORRHAGE  2015  . TONSILLECTOMY  1968    Current Medications: Outpatient Medications Prior to Visit  Medication  Sig Dispense Refill  . albuterol (PROVENTIL HFA;VENTOLIN HFA) 108 (90 BASE) MCG/ACT inhaler Inhale 2 puffs into the lungs every 6 (six) hours as needed for wheezing or shortness of breath.    Marland Kitchen albuterol (PROVENTIL HFA;VENTOLIN HFA) 108 (90 BASE) MCG/ACT inhaler Inhale 2 puffs into the lungs every 4 (four) hours as needed for wheezing or shortness of breath. 1 Inhaler 3  . Besifloxacin HCl (BESIVANCE) 0.6 % SUSP Apply to eye 3 (three) times daily. INSTILL 1 DROP IN [facility] EYE THREE TIMES A DAY    . Cholecalciferol (VITAMIN D3 PO) Take 2,000 Int'l Units by mouth daily.    . [facility] CINNAMON PO Take 250 mg by mouth 2 (two) times daily.     . Cyanocobalamin (VITAMIN B 12 PO) Take 1 tablet by mouth daily.    . fluticasone (FLONASE) 50 MCG/ACT nasal spray Place 2 sprays into the nose daily.    Marland Kitchen glimepiride (AMARYL) 4 MG tablet Take 4 mg by mouth 2 (two) times daily.    Marland Kitchen lactobacillus acidophilus (BACID) TABS Take 1 tablet by mouth daily. PROBIOTIC    . Manganese 10 MG TABS Take 1 tablet by mouth daily.     . milk thistle 175 MG tablet Take 525 mg by mouth daily. Takes 3 daily    . mometasone-formoterol (DULERA) 100-5  MCG/ACT AERO Inhale 2 puffs into the lungs 2 (two) times daily.    Glory Rosebush DELICA LANCETS MISC USE WITH METER TWICE DAILY TESTING 100 each 12  . oxymetazoline (AFRIN) 0.05 % nasal spray Place 2 sprays into the nose 2 (two) times daily as needed for congestion.    . potassium chloride (K-DUR) 10 MEQ tablet Take 10 mEq by mouth daily.    . sitaGLIPtin-metformin (JANUMET) 50-1000 MG tablet Take 1 tablet by mouth 2 (two) times daily with a meal.    . Saxagliptin-Metformin (KOMBIGLYZE XR) 2.02-999 MG TB24 Take by mouth daily.    Marland Kitchen aspirin 325 MG tablet Take 325 mg by mouth daily.    Marland Kitchen azithromycin (ZITHROMAX Z-PAK) 250 MG tablet Take 1 tablet (250 mg total) by mouth daily. Take 2 po qD x 1 day, then 1 po qD x 4 days 6 tablet 0  . benzonatate (TESSALON) 100 MG capsule Take 1 capsule (100 mg  total) by mouth every 8 (eight) hours. 21 capsule 0  . chlorpheniramine-HYDROcodone (TUSSIONEX PENNKINETIC ER) 10-8 MG/5ML SUER Take 5 mLs by mouth at bedtime as needed for cough. 115 mL 0  . levofloxacin (LEVAQUIN) 750 MG tablet Take 1 tablet (750 mg total) by mouth daily. 7 tablet 0  . Linagliptin-Metformin HCl (JENTADUETO) 2.02-999 MG TABS Take 1 tablet by mouth 2 (two) times daily.     . predniSONE (DELTASONE) 50 MG tablet Take 1 tablet (50 mg total) by mouth daily. 5 tablet 0  . Resveratrol 250 MG CAPS Take 1 capsule by mouth daily.    Marland Kitchen UNABLE TO FIND Take 800 mg by mouth 2 (two) times daily. Med Name: Hart Robinsons    . UNABLE TO FIND Take 1 tablet by mouth 2 (two) times daily. Med Name: Ma Hillock ([place])    . UNABLE TO FIND Take 1 tablet by mouth daily. Med Name: Ashwagandha Extract    . valsartan-hydrochlorothiazide (DIOVAN HCT) 160-12.5 MG per tablet Take 1 tablet by mouth daily. 30 tablet 11  . valsartan-hydrochlorothiazide (DIOVAN-HCT) 160-12.5 MG per tablet Take 1 tablet by mouth daily.     No facility-administered medications prior to visit.      Allergies:   Food; Other; Statins; Lobster [shellfish allergy]; Tetracyclines & related; and Wheat bran   Social History   Social History  . Marital status: Married    Spouse name: N/A  . Number of children: N/A  . Years of education: N/A   Social History Main Topics  . Smoking status: Never Smoker  . Smokeless tobacco: Never Used  . Alcohol use No  . Drug use: Unknown  . Sexual activity: Yes    Birth control/ protection: Post-menopausal   Other Topics Concern  . None   Social History Narrative  . None     Family History:  The patient's family history includes Alzheimer's disease in her father; Cancer in her cousin and mother; Diabetes in her cousin and maternal aunt; Psoriasis in her mother.   ROS:   Please see the history of present illness.    Fatigue, snoring, difficulty with balance, recent foot  fracture, dizziness, passing out, vision disturbance, shortness of breath when lying, irregular heartbeat, excessive fatigue.  All other systems reviewed and are negative.   PHYSICAL EXAM:   VS:  BP (!) 166/86 (BP Location: Left Arm)   Pulse 81   Ht 5\' 2"  (1.575 m)   Wt 186 lb 1.9 oz (84.4 kg)   BMI 34.04 kg/m  GEN: Well nourished, well developed, in no acute distress  HEENT: normal  Neck: no JVD, carotid bruits, or masses Cardiac: RRR; no murmurs, rubs, or gallops,no edema  Respiratory:  clear to auscultation bilaterally, normal work of breathing GI: soft, nontender, nondistended, + BS MS: no deformity or atrophy  Skin: warm and dry, no rash Neuro:  Alert and Oriented x 3, Strength and sensation are intact Psych: euthymic mood, full affect  Wt Readings from Last 3 Encounters:  03/10/17 186 lb 1.9 oz (84.4 kg)  11/27/12 187 lb (84.8 kg)  01/02/12 193 lb (87.5 kg)      Studies/Labs Reviewed:   EKG:  EKG  Normal sinus rhythm with left axis deviation and poor wave progression and overall pattern possibly suggestive of anterolateral infarction of uncertain age. The EKG changes are new.  Recent Labs: No results found for requested labs within last 8760 hours.   Lipid Panel    Component Value Date/Time   CHOL 176 10/13/2011 0903   TRIG 138 10/13/2011 0903   HDL 37 (L) 10/13/2011 0903   CHOLHDL 4.8 10/13/2011 0903   VLDL 28 10/13/2011 0903   LDLCALC 111 (H) 10/13/2011 0903    Additional studies/ records that were reviewed today include:      ASSESSMENT:    1. Dyspnea on exertion   2. Abnormal EKG   3. HTN, goal below 130/80   4. Type 2 diabetes mellitus with other circulatory complication, without long-term current use of insulin (HCC)      PLAN:  In order of problems listed above:  1. We need to assess LV function. This will be done as part of the [employer]. 2. Lexiscan Myoview to exclude perfusion abnormality and assess LV function. 3. Target  blood pressure is 130/90 mmHg or less. 4. Hemoglobin A1c target less than 7 5. LDL target less than 70.  The EKG is concerning and perhaps the dyspnea is an anginal equivalent. A myocardial perfusion study to exclude CAD is indicated. We will perform this and further workup will be based upon findings.  Medication Adjustments/Labs and Tests Ordered: Current medicines are reviewed at length with the patient today.  Concerns regarding medicines are outlined above.  Medication changes, Labs and Tests ordered today are listed in the Patient Instructions below. Patient Instructions  Medication Instructions:  1) INCREASE Diovan to 320/12.5mg  once daily.  Labwork: None  Testing/Procedures: Your physician has requested that you have a lexiscan myoview. For further information please visit [email or web address]. Please follow instruction sheet, as given.   Follow-Up: Your physician recommends that you schedule a follow-up appointment as needed with Dr. Tamala Julian.    Any Other Special Instructions Will Be Listed Below (If Applicable).     If you need a refill on your cardiac medications before your next appointment, please call your pharmacy.      Signed, Sinclair Grooms, MD  03/10/2017 10:51 AM    [facility] [place], [place], [place]  [address] Phone: [phone]; Fax: [phone]

## 2017-04-04 NOTE — Progress Notes (Signed)
Pt's CBG of 361 was treated with 6 units insulin at 0647. 0715 repeat CBG is 345. Gwynne Edinger notified

## 2017-04-04 NOTE — Progress Notes (Signed)
Dr. Tamala Julian aware of all of patient's blood pressure reading. Patient instructed to start back on medications tomorrow per Dr. Tamala Julian due to renal insufficiency and to start back on metformin containing medication on Friday of this week.

## 2017-04-05 ENCOUNTER — Encounter (HOSPITAL_COMMUNITY): Payer: Self-pay | Admitting: Interventional Cardiology

## 2017-04-12 ENCOUNTER — Encounter: Payer: Self-pay | Admitting: Interventional Cardiology

## 2017-04-14 ENCOUNTER — Telehealth: Payer: Self-pay | Admitting: Interventional Cardiology

## 2017-04-14 NOTE — Telephone Encounter (Signed)
Returned call to patient-advised ok to have dental appt prior to follow up.  Per chart review: Cath 6/12:   RECOMMENDATIONS:   Aggressive risk factor modification  Blood pressure control  Weight loss  Consider sleep study to rule out sleep apnea (based upon airway obstruction noted during the procedure after receiving low-dose sedation).  Patient aware and verbalized understanding.

## 2017-04-14 NOTE — Telephone Encounter (Signed)
New message    Pt is calling to find out if she should move out her dentist appt until after her follow up appt for after her cath. Pt states her appt is scheduled for 05/02/17.

## 2017-04-21 ENCOUNTER — Other Ambulatory Visit: Payer: Self-pay | Admitting: Internal Medicine

## 2017-04-21 DIAGNOSIS — Z1231 Encounter for screening mammogram for malignant neoplasm of breast: Secondary | ICD-10-CM

## 2017-04-27 DIAGNOSIS — I2510 Atherosclerotic heart disease of native coronary artery without angina pectoris: Secondary | ICD-10-CM | POA: Insufficient documentation

## 2017-04-27 NOTE — Progress Notes (Signed)
Cardiology Office Note    Date:  04/28/2017   ID:  Carolyn Russo, DOB December 31, 1950, MRN [id number]  PCP:  Carolyn Chard, MD  Cardiologist: Carolyn Grooms, MD   Chief Complaint  Patient presents with  . Congestive Heart Failure  . Coronary Artery Disease    History of Present Illness:  Carolyn Russo is a 66 y.o. female with diabetes mellitus, hypertension, hyperlipidemia, and nonobstructive coronary disease. Previous abnormal myocardial perfusion study led to cardiac catheterization in 2018. No significant obstruction was noted.  Miss Trim is doing well. No immediate side effects from cardiac catheterization. No chest discomfort, dyspnea, edema, syncope, or other problems.  Past Medical History:  Diagnosis Date  . Anterior chest wall pain   . Appendicitis 1965  . Asthma   . Body mass index 37.0-37.9, adult   . Breast pain   . Chronic renal disease, stage II   . Dehydration 2014  . Deviated septum 1971  . Diabetes mellitus   . Dyspnea 2014  . Extrinsic asthma    WITH ASTHMA ATTACK  . Fibroid 1980  . Hx gestational diabetes   . Hypertension 2014  . Inguinal hernia 1959  . Malaise and fatigue 2014  . Nephropathy   . Non-IgE mediated allergic asthma 2014  . Obesity   . Pelvic pain   . Pregnancy, high-risk 1985  . Tonsillitis 1968  . Uterine fibroid 1980    Past Surgical History:  Procedure Laterality Date  . APPENDECTOMY  1959  . CESAREAN SECTION  1985  . HERNIA REPAIR  1959  . LEFT HEART CATH AND CORONARY ANGIOGRAPHY N/A 04/04/2017   Procedure: Left Heart Cath and Coronary Angiography;  Surgeon: Carolyn Crome, MD;  Location: [facility];  Service: Cardiovascular;  Laterality: N/A;  . [place], 2004, 2007  . RHINOPLASTY  1971  . ROTATOR CUFF REPAIR  2003  . SURGICAL REPAIR OF HEMORRHAGE  2015  . TONSILLECTOMY  1968    Current Medications: Outpatient Medications Prior to Visit  Medication Sig Dispense Refill  . albuterol (PROVENTIL  HFA;VENTOLIN HFA) 108 (90 BASE) MCG/ACT inhaler Inhale 2 puffs into the lungs every 4 (four) hours as needed for wheezing or shortness of breath. 1 Inhaler 3  . aspirin 81 MG tablet Take 81 mg by mouth daily.    . Cholecalciferol (VITAMIN D3 PO) Take 2,000 Int'l Units by mouth daily.    . CVS CINNAMON PO Take 250 mg by mouth 2 (two) times daily.     . fluticasone (FLONASE) 50 MCG/ACT nasal spray Place 2 sprays into the nose daily as needed for allergies.     Marland Kitchen glimepiride (AMARYL) 4 MG tablet Take 4 mg by mouth 2 (two) times daily.    Marland Kitchen lactobacillus acidophilus (BACID) TABS Take 1 tablet by mouth daily. PROBIOTIC    . Manganese 10 MG TABS Take 1 tablet by mouth daily.     . milk thistle 175 MG tablet Take 525 mg by mouth daily. Takes 3 daily    . mometasone-formoterol (DULERA) 100-5 MCG/ACT AERO Inhale 2 puffs into the lungs 2 (two) times daily.    Marland Kitchen oxymetazoline (AFRIN) 0.05 % nasal spray Place 2 sprays into the nose 2 (two) times daily as needed for congestion.    . pantoprazole (PROTONIX) 40 MG tablet Take 40 mg by mouth daily.    . sitaGLIPtin-metformin (JANUMET) 50-1000 MG tablet Take 1 tablet by mouth 2 (two) times daily with a meal.    .  rosuvastatin (CRESTOR) 5 MG tablet Take 1 tablet (5 mg total) by mouth daily. 90 tablet 3  . valsartan-hydrochlorothiazide (DIOVAN HCT) 320-12.5 MG tablet Take 1 tablet by mouth daily. 90 tablet 3  . Cyanocobalamin (VITAMIN B 12 PO) Take 1 tablet by mouth daily.     No facility-administered medications prior to visit.      Allergies:   Food; Other; Wheat bran; Statins; Lobster [shellfish allergy]; Tetracyclines & related; and Contrast media [iodinated diagnostic agents]   Social History   Social History  . Marital status: Married    Spouse name: N/A  . Number of children: N/A  . Years of education: N/A   Social History Main Topics  . Smoking status: Never Smoker  . Smokeless tobacco: Never Used  . Alcohol use No  . Drug use: No  . Sexual  activity: Yes    Birth control/ protection: Post-menopausal   Other Topics Concern  . None   Social History Narrative  . None     Family History:  The patient's family history includes Alzheimer's disease in her father; Cancer in her cousin and mother; Diabetes in her cousin and maternal aunt; Psoriasis in her mother.   ROS:   Please see the history of present illness.    Stressed and overworked. Stress involved 70 care for her husband, her own health needs, and their work schedule. No medication side effects.  All other systems reviewed and are negative.   PHYSICAL EXAM:   VS:  BP (!) 146/76 (BP Location: Left Arm)   Pulse 78   Ht 5' 2.5" (1.588 m)   Wt 188 lb 9.6 oz (85.5 kg)   BMI 33.95 kg/m    GEN: Well nourished, well developed, in no acute distress . Moderate to severe obesity. HEENT: normal  Neck: no JVD, carotid bruits, or masses Cardiac: RRR; no murmurs, rubs, or gallops,no edema  Respiratory:  clear to auscultation bilaterally, normal work of breathing GI: soft, nontender, nondistended, + BS MS: no deformity or atrophy  Skin: warm and dry, no rash Neuro:  Alert and Oriented x 3, Strength and sensation are intact Psych: euthymic mood, full affect  Wt Readings from Last 3 Encounters:  04/28/17 188 lb 9.6 oz (85.5 kg)  04/04/17 186 lb (84.4 kg)  03/10/17 186 lb 1.9 oz (84.4 kg)      Studies/Labs Reviewed:   EKG:  EKG  Not repeated  Recent Labs: 03/31/2017: BUN 25; Creatinine, Ser 1.22; Hemoglobin 10.4; Platelets 322; Potassium 4.1; Sodium 142   Lipid Panel    Component Value Date/Time   CHOL 176 10/13/2011 0903   TRIG 138 10/13/2011 0903   HDL 37 (L) 10/13/2011 0903   CHOLHDL 4.8 10/13/2011 0903   VLDL 28 10/13/2011 0903   LDLCALC 111 (H) 10/13/2011 0903    Additional studies/ records that were reviewed today include:  Coronary angiogram performed June 2018: Coronary Diagrams   Diagnostic Diagram            ASSESSMENT:    1. Coronary  artery disease involving native coronary artery of native heart without angina pectoris   2. Obesity (BMI 30-39.9)   3. Type 2 diabetes mellitus with other circulatory complication, without long-term current use of insulin ([place])   4. HTN, goal below 130/80   5. Hyperlipidemia with target LDL less than 70   6. Dyspnea on exertion      PLAN:  In order of problems listed above:  1. Minimal coronary disease documented on catheterization.  I would recommend LDL target of 70 or less since she is diabetic and has minimal plaquing in her coronaries. No further cardiac workup is needed. LV function is normal. She has no obstruction greater than 40%. Also have again discussed weight reduction by decreasing caloric intake and exercise. 2. Prescribed weight loss with reduction in caloric intake and exercise. 3. Per primary care 4. High blood pressure should be treated with target less than 130/90 mmHg 5. LDL target less than 70.  Overall, improved diet, decreased caloric intake, exercise, and attained targets as mentioned above. Cardiac follow-up as needed.  Medication Adjustments/Labs and Tests Ordered: Current medicines are reviewed at length with the patient today.  Concerns regarding medicines are outlined above.  Medication changes, Labs and Tests ordered today are listed in the Patient Instructions below. Patient Instructions  Medication Instructions:  None  Labwork: None  Testing/Procedures: None  Follow-Up: Your physician recommends that you schedule a follow-up appointment as needed with Dr. Tamala Julian.    Any Other Special Instructions Will Be Listed Below (If Applicable).     If you need a refill on your cardiac medications before your next appointment, please call your pharmacy.      Signed, Carolyn Grooms, MD  04/28/2017 9:58 AM    [facility] [place], [place], [place]  [address] Phone: [phone]; Fax: [phone]

## 2017-04-28 ENCOUNTER — Encounter: Payer: Self-pay | Admitting: Interventional Cardiology

## 2017-04-28 ENCOUNTER — Ambulatory Visit (INDEPENDENT_AMBULATORY_CARE_PROVIDER_SITE_OTHER): Payer: Medicare Other | Admitting: Interventional Cardiology

## 2017-04-28 VITALS — BP 146/76 | HR 78 | Ht 62.5 in | Wt 188.6 lb

## 2017-04-28 DIAGNOSIS — I10 Essential (primary) hypertension: Secondary | ICD-10-CM

## 2017-04-28 DIAGNOSIS — I2583 Coronary atherosclerosis due to lipid rich plaque: Secondary | ICD-10-CM

## 2017-04-28 DIAGNOSIS — E785 Hyperlipidemia, unspecified: Secondary | ICD-10-CM | POA: Diagnosis not present

## 2017-04-28 DIAGNOSIS — E669 Obesity, unspecified: Secondary | ICD-10-CM

## 2017-04-28 DIAGNOSIS — E1159 Type 2 diabetes mellitus with other circulatory complications: Secondary | ICD-10-CM | POA: Diagnosis not present

## 2017-04-28 DIAGNOSIS — R0600 Dyspnea, unspecified: Secondary | ICD-10-CM

## 2017-04-28 DIAGNOSIS — I2510 Atherosclerotic heart disease of native coronary artery without angina pectoris: Secondary | ICD-10-CM

## 2017-04-28 DIAGNOSIS — R0609 Other forms of dyspnea: Secondary | ICD-10-CM | POA: Diagnosis not present

## 2017-04-28 MED ORDER — ROSUVASTATIN CALCIUM 5 MG PO TABS: 5.0000 mg | ORAL_TABLET | Freq: Every day | ORAL | 3 refills | Status: DC

## 2017-04-28 MED ORDER — VALSARTAN-HYDROCHLOROTHIAZIDE 320-12.5 MG PO TABS: 1.0000 | ORAL_TABLET | Freq: Every day | ORAL | 3 refills | Status: DC

## 2017-04-28 NOTE — Patient Instructions (Signed)
Medication Instructions:  None  Labwork: None  Testing/Procedures: None  Follow-Up: Your physician recommends that you schedule a follow-up appointment as needed with Dr. Smith.    Any Other Special Instructions Will Be Listed Below (If Applicable).     If you need a refill on your cardiac medications before your next appointment, please call your pharmacy.   

## 2017-05-03 ENCOUNTER — Ambulatory Visit
Admission: RE | Admit: 2017-05-03 | Discharge: 2017-05-03 | Disposition: A | Discharge status: Home / Self Care | Payer: Medicare Other | Source: Ambulatory Visit | Attending: Internal Medicine | Admitting: Internal Medicine

## 2017-05-03 DIAGNOSIS — Z1231 Encounter for screening mammogram for malignant neoplasm of breast: Secondary | ICD-10-CM

## 2017-05-08 ENCOUNTER — Other Ambulatory Visit: Payer: Medicare Other | Admitting: *Deleted

## 2017-05-08 DIAGNOSIS — E785 Hyperlipidemia, unspecified: Secondary | ICD-10-CM | POA: Diagnosis not present

## 2017-05-09 LAB — LIPID PANEL
Chol/HDL Ratio: 3.6 ratio (ref 0.0–4.4)
Cholesterol, Total: 126 mg/dL (ref 100–199)
HDL: 35 mg/dL — ABNORMAL LOW (ref >39)
LDL Calculated: 64 mg/dL (ref 0–99)
Triglycerides: 135 mg/dL (ref 0–149)
VLDL Cholesterol Cal: 27 mg/dL (ref 5–40)

## 2017-05-09 LAB — HEPATIC FUNCTION PANEL
ALT: 22 IU/L (ref 0–32)
AST: 18 IU/L (ref 0–40)
Albumin: 3.9 g/dL (ref 3.6–4.8)
Alkaline Phosphatase: 55 IU/L (ref 39–117)
Bilirubin Total: 0.6 mg/dL (ref 0.0–1.2)
Bilirubin, Direct: 0.13 mg/dL (ref 0.00–0.40)
Total Protein: 6.7 g/dL (ref 6.0–8.5)

## 2017-08-09 DIAGNOSIS — Z23 Encounter for immunization: Secondary | ICD-10-CM | POA: Diagnosis not present

## 2017-10-02 ENCOUNTER — Encounter (INDEPENDENT_AMBULATORY_CARE_PROVIDER_SITE_OTHER): Payer: Medicare Other | Admitting: Ophthalmology

## 2017-10-10 DIAGNOSIS — N182 Chronic kidney disease, stage 2 (mild): Secondary | ICD-10-CM | POA: Diagnosis not present

## 2017-10-10 DIAGNOSIS — I129 Hypertensive chronic kidney disease with stage 1 through stage 4 chronic kidney disease, or unspecified chronic kidney disease: Secondary | ICD-10-CM | POA: Diagnosis not present

## 2017-10-10 DIAGNOSIS — N08 Glomerular disorders in diseases classified elsewhere: Secondary | ICD-10-CM | POA: Diagnosis not present

## 2017-10-10 DIAGNOSIS — R413 Other amnesia: Secondary | ICD-10-CM | POA: Diagnosis not present

## 2017-10-10 DIAGNOSIS — E1122 Type 2 diabetes mellitus with diabetic chronic kidney disease: Secondary | ICD-10-CM | POA: Diagnosis not present

## 2017-10-11 ENCOUNTER — Encounter (INDEPENDENT_AMBULATORY_CARE_PROVIDER_SITE_OTHER): Payer: Medicare Other | Admitting: Ophthalmology

## 2017-10-11 DIAGNOSIS — E113592 Type 2 diabetes mellitus with proliferative diabetic retinopathy without macular edema, left eye: Secondary | ICD-10-CM

## 2017-10-11 DIAGNOSIS — H35033 Hypertensive retinopathy, bilateral: Secondary | ICD-10-CM | POA: Diagnosis not present

## 2017-10-11 DIAGNOSIS — H43813 Vitreous degeneration, bilateral: Secondary | ICD-10-CM | POA: Diagnosis not present

## 2017-10-11 DIAGNOSIS — I10 Essential (primary) hypertension: Secondary | ICD-10-CM | POA: Diagnosis not present

## 2017-10-11 DIAGNOSIS — E11311 Type 2 diabetes mellitus with unspecified diabetic retinopathy with macular edema: Secondary | ICD-10-CM | POA: Diagnosis not present

## 2017-10-11 DIAGNOSIS — E113511 Type 2 diabetes mellitus with proliferative diabetic retinopathy with macular edema, right eye: Secondary | ICD-10-CM | POA: Diagnosis not present

## 2017-10-11 DIAGNOSIS — H2513 Age-related nuclear cataract, bilateral: Secondary | ICD-10-CM | POA: Diagnosis not present

## 2017-11-08 ENCOUNTER — Encounter (INDEPENDENT_AMBULATORY_CARE_PROVIDER_SITE_OTHER): Payer: Medicare Other | Admitting: Ophthalmology

## 2017-11-08 DIAGNOSIS — E11311 Type 2 diabetes mellitus with unspecified diabetic retinopathy with macular edema: Secondary | ICD-10-CM | POA: Diagnosis not present

## 2017-11-08 DIAGNOSIS — H43813 Vitreous degeneration, bilateral: Secondary | ICD-10-CM | POA: Diagnosis not present

## 2017-11-08 DIAGNOSIS — E113592 Type 2 diabetes mellitus with proliferative diabetic retinopathy without macular edema, left eye: Secondary | ICD-10-CM

## 2017-11-08 DIAGNOSIS — I10 Essential (primary) hypertension: Secondary | ICD-10-CM

## 2017-11-08 DIAGNOSIS — H35033 Hypertensive retinopathy, bilateral: Secondary | ICD-10-CM | POA: Diagnosis not present

## 2017-11-08 DIAGNOSIS — E113511 Type 2 diabetes mellitus with proliferative diabetic retinopathy with macular edema, right eye: Secondary | ICD-10-CM | POA: Diagnosis not present

## 2017-12-04 ENCOUNTER — Encounter (INDEPENDENT_AMBULATORY_CARE_PROVIDER_SITE_OTHER): Payer: Medicare Other | Admitting: Ophthalmology

## 2017-12-04 DIAGNOSIS — E11311 Type 2 diabetes mellitus with unspecified diabetic retinopathy with macular edema: Secondary | ICD-10-CM | POA: Diagnosis not present

## 2017-12-04 DIAGNOSIS — I10 Essential (primary) hypertension: Secondary | ICD-10-CM | POA: Diagnosis not present

## 2017-12-04 DIAGNOSIS — E113592 Type 2 diabetes mellitus with proliferative diabetic retinopathy without macular edema, left eye: Secondary | ICD-10-CM | POA: Diagnosis not present

## 2017-12-04 DIAGNOSIS — H35033 Hypertensive retinopathy, bilateral: Secondary | ICD-10-CM

## 2017-12-04 DIAGNOSIS — H2513 Age-related nuclear cataract, bilateral: Secondary | ICD-10-CM | POA: Diagnosis not present

## 2017-12-04 DIAGNOSIS — H43813 Vitreous degeneration, bilateral: Secondary | ICD-10-CM | POA: Diagnosis not present

## 2017-12-04 DIAGNOSIS — E113511 Type 2 diabetes mellitus with proliferative diabetic retinopathy with macular edema, right eye: Secondary | ICD-10-CM | POA: Diagnosis not present

## 2018-01-01 ENCOUNTER — Encounter (INDEPENDENT_AMBULATORY_CARE_PROVIDER_SITE_OTHER): Payer: Medicare Other | Admitting: Ophthalmology

## 2018-01-01 DIAGNOSIS — I10 Essential (primary) hypertension: Secondary | ICD-10-CM | POA: Diagnosis not present

## 2018-01-01 DIAGNOSIS — H35033 Hypertensive retinopathy, bilateral: Secondary | ICD-10-CM

## 2018-01-01 DIAGNOSIS — E113511 Type 2 diabetes mellitus with proliferative diabetic retinopathy with macular edema, right eye: Secondary | ICD-10-CM | POA: Diagnosis not present

## 2018-01-01 DIAGNOSIS — H2513 Age-related nuclear cataract, bilateral: Secondary | ICD-10-CM

## 2018-01-01 DIAGNOSIS — E11311 Type 2 diabetes mellitus with unspecified diabetic retinopathy with macular edema: Secondary | ICD-10-CM

## 2018-01-01 DIAGNOSIS — E113392 Type 2 diabetes mellitus with moderate nonproliferative diabetic retinopathy without macular edema, left eye: Secondary | ICD-10-CM | POA: Diagnosis not present

## 2018-01-01 DIAGNOSIS — H43813 Vitreous degeneration, bilateral: Secondary | ICD-10-CM | POA: Diagnosis not present

## 2018-01-10 DIAGNOSIS — N183 Chronic kidney disease, stage 3 (moderate): Secondary | ICD-10-CM | POA: Diagnosis not present

## 2018-01-10 DIAGNOSIS — E1122 Type 2 diabetes mellitus with diabetic chronic kidney disease: Secondary | ICD-10-CM | POA: Diagnosis not present

## 2018-01-10 DIAGNOSIS — I129 Hypertensive chronic kidney disease with stage 1 through stage 4 chronic kidney disease, or unspecified chronic kidney disease: Secondary | ICD-10-CM | POA: Diagnosis not present

## 2018-01-10 DIAGNOSIS — N08 Glomerular disorders in diseases classified elsewhere: Secondary | ICD-10-CM | POA: Diagnosis not present

## 2018-01-29 ENCOUNTER — Encounter (INDEPENDENT_AMBULATORY_CARE_PROVIDER_SITE_OTHER): Payer: Medicare Other | Admitting: Ophthalmology

## 2018-01-29 DIAGNOSIS — H35033 Hypertensive retinopathy, bilateral: Secondary | ICD-10-CM | POA: Diagnosis not present

## 2018-01-29 DIAGNOSIS — E113592 Type 2 diabetes mellitus with proliferative diabetic retinopathy without macular edema, left eye: Secondary | ICD-10-CM

## 2018-01-29 DIAGNOSIS — I10 Essential (primary) hypertension: Secondary | ICD-10-CM | POA: Diagnosis not present

## 2018-01-29 DIAGNOSIS — E113511 Type 2 diabetes mellitus with proliferative diabetic retinopathy with macular edema, right eye: Secondary | ICD-10-CM | POA: Diagnosis not present

## 2018-01-29 DIAGNOSIS — H43813 Vitreous degeneration, bilateral: Secondary | ICD-10-CM | POA: Diagnosis not present

## 2018-01-29 DIAGNOSIS — H2513 Age-related nuclear cataract, bilateral: Secondary | ICD-10-CM | POA: Diagnosis not present

## 2018-01-29 DIAGNOSIS — E11311 Type 2 diabetes mellitus with unspecified diabetic retinopathy with macular edema: Secondary | ICD-10-CM | POA: Diagnosis not present

## 2018-02-23 ENCOUNTER — Encounter (INDEPENDENT_AMBULATORY_CARE_PROVIDER_SITE_OTHER): Payer: Medicare Other | Admitting: Ophthalmology

## 2018-02-28 DIAGNOSIS — I129 Hypertensive chronic kidney disease with stage 1 through stage 4 chronic kidney disease, or unspecified chronic kidney disease: Secondary | ICD-10-CM | POA: Diagnosis not present

## 2018-02-28 DIAGNOSIS — E669 Obesity, unspecified: Secondary | ICD-10-CM | POA: Diagnosis not present

## 2018-02-28 DIAGNOSIS — N183 Chronic kidney disease, stage 3 (moderate): Secondary | ICD-10-CM | POA: Diagnosis not present

## 2018-03-01 DIAGNOSIS — Z1389 Encounter for screening for other disorder: Secondary | ICD-10-CM | POA: Diagnosis not present

## 2018-03-01 DIAGNOSIS — Z1211 Encounter for screening for malignant neoplasm of colon: Secondary | ICD-10-CM | POA: Diagnosis not present

## 2018-03-05 ENCOUNTER — Other Ambulatory Visit: Payer: Self-pay | Admitting: Nephrology

## 2018-03-05 DIAGNOSIS — N183 Chronic kidney disease, stage 3 unspecified: Secondary | ICD-10-CM

## 2018-03-07 ENCOUNTER — Encounter (INDEPENDENT_AMBULATORY_CARE_PROVIDER_SITE_OTHER): Payer: Medicare Other | Admitting: Ophthalmology

## 2018-03-07 DIAGNOSIS — E113592 Type 2 diabetes mellitus with proliferative diabetic retinopathy without macular edema, left eye: Secondary | ICD-10-CM

## 2018-03-07 DIAGNOSIS — H35033 Hypertensive retinopathy, bilateral: Secondary | ICD-10-CM

## 2018-03-07 DIAGNOSIS — H43813 Vitreous degeneration, bilateral: Secondary | ICD-10-CM | POA: Diagnosis not present

## 2018-03-07 DIAGNOSIS — E11311 Type 2 diabetes mellitus with unspecified diabetic retinopathy with macular edema: Secondary | ICD-10-CM | POA: Diagnosis not present

## 2018-03-07 DIAGNOSIS — E113511 Type 2 diabetes mellitus with proliferative diabetic retinopathy with macular edema, right eye: Secondary | ICD-10-CM

## 2018-03-07 DIAGNOSIS — I10 Essential (primary) hypertension: Secondary | ICD-10-CM

## 2018-03-15 ENCOUNTER — Ambulatory Visit
Admission: RE | Admit: 2018-03-15 | Discharge: 2018-03-15 | Disposition: A | Discharge status: Home / Self Care | Payer: Medicare Other | Source: Ambulatory Visit | Attending: Nephrology | Admitting: Nephrology

## 2018-03-15 DIAGNOSIS — N183 Chronic kidney disease, stage 3 unspecified: Secondary | ICD-10-CM

## 2018-03-15 DIAGNOSIS — N2889 Other specified disorders of kidney and ureter: Secondary | ICD-10-CM | POA: Diagnosis not present

## 2018-03-29 ENCOUNTER — Other Ambulatory Visit: Payer: Self-pay

## 2018-03-29 ENCOUNTER — Ambulatory Visit (AMBULATORY_SURGERY_CENTER): Payer: Self-pay | Admitting: *Deleted

## 2018-03-29 VITALS — Ht 62.0 in | Wt 192.0 lb

## 2018-03-29 DIAGNOSIS — Z1211 Encounter for screening for malignant neoplasm of colon: Secondary | ICD-10-CM

## 2018-03-29 MED ORDER — PEG 3350-KCL-NA BICARB-NACL 420 G PO SOLR: 4000.0000 mL | Freq: Once | ORAL | 0 refills | Status: AC

## 2018-03-29 NOTE — Progress Notes (Signed)
No egg or soy allergy known to patient  No issues with past sedation with any surgeries  or procedures, no intubation problems  No diet pills per patient No home 02 use per patient  No blood thinners per patient  Pt denies issues with constipation only if she eats a lot of cheese and no veggies No A fib or A flutter  EMMI video sent to pt's e mail pt. declined

## 2018-04-04 ENCOUNTER — Encounter (INDEPENDENT_AMBULATORY_CARE_PROVIDER_SITE_OTHER): Payer: Medicare Other | Admitting: Ophthalmology

## 2018-04-04 ENCOUNTER — Other Ambulatory Visit: Payer: Self-pay | Admitting: Internal Medicine

## 2018-04-04 DIAGNOSIS — E1122 Type 2 diabetes mellitus with diabetic chronic kidney disease: Secondary | ICD-10-CM | POA: Diagnosis not present

## 2018-04-04 DIAGNOSIS — E559 Vitamin D deficiency, unspecified: Secondary | ICD-10-CM | POA: Diagnosis not present

## 2018-04-04 DIAGNOSIS — Z1231 Encounter for screening mammogram for malignant neoplasm of breast: Secondary | ICD-10-CM

## 2018-04-04 DIAGNOSIS — I129 Hypertensive chronic kidney disease with stage 1 through stage 4 chronic kidney disease, or unspecified chronic kidney disease: Secondary | ICD-10-CM | POA: Diagnosis not present

## 2018-04-04 DIAGNOSIS — N08 Glomerular disorders in diseases classified elsewhere: Secondary | ICD-10-CM | POA: Diagnosis not present

## 2018-04-04 DIAGNOSIS — I259 Chronic ischemic heart disease, unspecified: Secondary | ICD-10-CM | POA: Diagnosis not present

## 2018-04-04 DIAGNOSIS — N183 Chronic kidney disease, stage 3 (moderate): Secondary | ICD-10-CM | POA: Diagnosis not present

## 2018-04-10 ENCOUNTER — Encounter: Payer: Self-pay | Admitting: Gastroenterology

## 2018-04-10 ENCOUNTER — Ambulatory Visit (AMBULATORY_SURGERY_CENTER): Payer: Medicare Other | Admitting: Gastroenterology

## 2018-04-10 ENCOUNTER — Other Ambulatory Visit: Payer: Self-pay

## 2018-04-10 ENCOUNTER — Other Ambulatory Visit: Payer: Self-pay | Admitting: Gastroenterology

## 2018-04-10 VITALS — BP 176/91 | HR 67 | Temp 98.6°F | Resp 21 | Ht 62.0 in | Wt 192.0 lb

## 2018-04-10 DIAGNOSIS — D123 Benign neoplasm of transverse colon: Secondary | ICD-10-CM | POA: Diagnosis not present

## 2018-04-10 DIAGNOSIS — D122 Benign neoplasm of ascending colon: Secondary | ICD-10-CM | POA: Diagnosis not present

## 2018-04-10 DIAGNOSIS — Z1211 Encounter for screening for malignant neoplasm of colon: Secondary | ICD-10-CM | POA: Diagnosis not present

## 2018-04-10 DIAGNOSIS — R140 Abdominal distension (gaseous): Secondary | ICD-10-CM

## 2018-04-10 MED ORDER — HYOSCYAMINE SULFATE 0.125 MG SL SUBL: 0.1250 mg | SUBLINGUAL_TABLET | Freq: Four times a day (QID) | SUBLINGUAL | 2 refills | Status: DC | PRN

## 2018-04-10 MED ORDER — SODIUM CHLORIDE 0.9 % IV SOLN: 500.0000 mL | INTRAVENOUS | Status: DC

## 2018-04-10 NOTE — Progress Notes (Signed)
Spontaneous respirations throughout. VSS. Resting comfortably. To PACU on room air. Report to  RN. 

## 2018-04-10 NOTE — Progress Notes (Signed)
YOU HAD AN ENDOSCOPIC PROCEDURE TODAY AT THE [facility]:   Refer to the procedure report that was given to you for any specific questions about what was found during the examination.  If the procedure report does not answer your questions, please call your gastroenterologist to clarify.  If you requested that your care partner not be given the details of your procedure findings, then the procedure report has been included in a sealed envelope for you to review at your convenience later.  YOU SHOULD EXPECT: Some feelings of bloating in the abdomen. Passage of more gas than usual.  Walking can help get rid of the air that was put into your GI tract during the procedure and reduce the bloating. If you had a lower endoscopy (such as a colonoscopy or flexible sigmoidoscopy) you may notice spotting of blood in your stool or on the toilet paper. If you underwent a bowel prep for your procedure, you may not have a normal bowel movement for a few days.  Please Note:  You might notice some irritation and congestion in your nose or some drainage.  This is from the oxygen used during your procedure.  There is no need for concern and it should clear up in a day or so.  SYMPTOMS TO REPORT IMMEDIATELY:   Following lower endoscopy (colonoscopy or flexible sigmoidoscopy):  Excessive amounts of blood in the stool  Significant tenderness or worsening of abdominal pains  Swelling of the abdomen that is new, acute  Fever of 100F or higher   Following upper endoscopy (EGD)  Vomiting of blood or coffee ground material  New chest pain or pain under the shoulder blades  Painful or persistently difficult swallowing  New shortness of breath  Fever of 100F or higher  Black, tarry-looking stools  For urgent or emergent issues, a gastroenterologist can be reached at any hour by calling [phone].   DIET:  We do recommend a small meal at first, but then you may proceed to your regular diet.  Drink  plenty of fluids but you should avoid alcoholic beverages for 24 hours.  ACTIVITY:  You should plan to take it easy for the rest of today and you should NOT DRIVE or use heavy machinery until tomorrow (because of the sedation medicines used during the test).    FOLLOW UP: Our staff will call the number listed on your records the next business day following your procedure to check on you and address any questions or concerns that you may have regarding the information given to you following your procedure. If we do not reach you, we will leave a message.  However, if you are feeling well and you are not experiencing any problems, there is no need to return our call.  We will assume that you have returned to your regular daily activities without incident.  If any biopsies were taken you will be contacted by phone or by letter within the next 1-3 weeks.  Please call us at [phone] if you have not heard about the biopsies in 3 weeks.    SIGNATURES/CONFIDENTIALITY: You and/or your care partner have signed paperwork which will be entered into your electronic medical record.  These signatures attest to the fact that that the information above on your After Visit Summary has been reviewed and is understood.  Full responsibility of the confidentiality of this discharge information lies with you and/or your care-partner. 

## 2018-04-10 NOTE — Progress Notes (Signed)
Called to room to assist during endoscopic procedure.  Patient ID and intended procedure confirmed with present staff. Received instructions for my participation in the procedure from the performing physician.  

## 2018-04-10 NOTE — Op Note (Signed)
[name] Patient Name: Carolyn Russo Procedure Date: 04/10/2018 2:03 PM MRN: [id number] Endoscopist: Mallie Mussel L. Loletha Carrow , MD Age: 67 Referring MD:  Date of Birth: 06-May-1951 Gender: Female Account #: [id number] Procedure:                Colonoscopy Indications:              Screening for colorectal malignant neoplasm (no                            polyps on 01/2005 colonoscopy) Medicines:                Monitored Anesthesia Care Procedure:                Pre-Anesthesia Assessment:                           - Prior to the procedure, a History and Physical                            was performed, and patient medications and                            allergies were reviewed. The patient's tolerance of                            previous anesthesia was also reviewed. The risks                            and benefits of the procedure and the sedation                            options and risks were discussed with the patient.                            All questions were answered, and informed consent                            was obtained. Anticoagulants: The patient has taken                            aspirin. It was decided not to withhold this                            medication prior to the procedure. ASA Grade                            Assessment: III - A patient with severe systemic                            disease. After reviewing the risks and benefits,                            the patient was deemed in satisfactory condition to  undergo the procedure.                           After obtaining informed consent, the colonoscope                            was passed under direct vision. Throughout the                            procedure, the patient's blood pressure, pulse, and                            oxygen saturations were monitored continuously. The                            Colonoscope was introduced through the anus and                      advanced to the the cecum, identified by                            appendiceal orifice and ileocecal valve. The                            colonoscopy was performed without difficulty. The                            patient tolerated the procedure well. The quality                            of the bowel preparation was good. The ileocecal                            valve, appendiceal orifice, and rectum were                            photographed. The quality of the bowel preparation                            was evaluated using the BBPS [facility] Bowel                            Preparation Scale) with scores of: Right Colon = 2,                            Transverse Colon = 2 and Left Colon = 2. The total                            BBPS score equals 6. Scope In: 2:08:05 PM Scope Out: 2:24:06 PM Scope Withdrawal Time: 0 hours 9 minutes 1 second  Total Procedure Duration: 0 hours 16 minutes 1 second  Findings:                 The perianal and digital rectal examinations were  normal.                           Three sessile polyps were found in the splenic                            flexure, hepatic flexure and ascending colon. The                            polyps were 3 to 5 mm in size. These polyps were                            removed with a cold snare. Resection and retrieval                            were complete.                           The exam was otherwise without abnormality on                            direct and retroflexion views. Complications:            No immediate complications. Estimated Blood Loss:     Estimated blood loss was minimal. Impression:               - Three 3 to 5 mm polyps at the splenic flexure, at                            the hepatic flexure and in the ascending colon,                            removed with a cold snare. Resected and retrieved.                           - The examination was  otherwise normal on direct                            and retroflexion views. Recommendation:           - Patient has a contact number available for                            emergencies. The signs and symptoms of potential                            delayed complications were discussed with the                            patient. Return to normal activities tomorrow.                            Written discharge instructions were provided to the  patient.                           - Resume previous diet.                           - Continue present medications.                           - Await pathology results.                           - Repeat colonoscopy is recommended for                            surveillance. The colonoscopy date will be                            determined after pathology results from today's                            exam become available for review. Lucca Greggs L. Loletha Carrow, MD 04/10/2018 2:30:13 PM This report has been signed electronically.

## 2018-04-10 NOTE — Patient Instructions (Signed)
  Please read handout on polyps. Continue present medications.    YOU HAD AN ENDOSCOPIC PROCEDURE TODAY AT [facility]:   Refer to the procedure report that was given to you for any specific questions about what was found during the examination.  If the procedure report does not answer your questions, please call your gastroenterologist to clarify.  If you requested that your care partner not be given the details of your procedure findings, then the procedure report has been included in a sealed envelope for you to review at your convenience later.  YOU SHOULD EXPECT: Some feelings of bloating in the abdomen. Passage of more gas than usual.  Walking can help get rid of the air that was put into your GI tract during the procedure and reduce the bloating. If you had a lower endoscopy (such as a colonoscopy or flexible sigmoidoscopy) you may notice spotting of blood in your stool or on the toilet paper. If you underwent a bowel prep for your procedure, you may not have a normal bowel movement for a few days.  Please Note:  You might notice some irritation and congestion in your nose or some drainage.  This is from the oxygen used during your procedure.  There is no need for concern and it should clear up in a day or so.  SYMPTOMS TO REPORT IMMEDIATELY:   Following lower endoscopy (colonoscopy or flexible sigmoidoscopy):  Excessive amounts of blood in the stool  Significant tenderness or worsening of abdominal pains  Swelling of the abdomen that is new, acute  Fever of 100F or higher   For urgent or emergent issues, a gastroenterologist can be reached at any hour by calling [phone].   DIET:  We do recommend a small meal at first, but then you may proceed to your regular diet.  Drink plenty of fluids but you should avoid alcoholic beverages for 24 hours.  ACTIVITY:  You should plan to take it easy for the rest of today and you should NOT DRIVE or use heavy machinery  until tomorrow (because of the sedation medicines used during the test).    FOLLOW UP: Our staff will call the number listed on your records the next business day following your procedure to check on you and address any questions or concerns that you may have regarding the information given to you following your procedure. If we do not reach you, we will leave a message.  However, if you are feeling well and you are not experiencing any problems, there is no need to return our call.  We will assume that you have returned to your regular daily activities without incident.  If any biopsies were taken you will be contacted by phone or by letter within the next 1-3 weeks.  Please call us at [phone] if you have not heard about the biopsies in 3 weeks.    SIGNATURES/CONFIDENTIALITY: You and/or your care partner have signed paperwork which will be entered into your electronic medical record.  These signatures attest to the fact that that the information above on your After Visit Summary has been reviewed and is understood.  Full responsibility of the confidentiality of this discharge information lies with you and/or your care-partner.

## 2018-04-11 ENCOUNTER — Telehealth: Payer: Self-pay

## 2018-04-11 ENCOUNTER — Encounter (INDEPENDENT_AMBULATORY_CARE_PROVIDER_SITE_OTHER): Payer: Medicare Other | Admitting: Ophthalmology

## 2018-04-11 DIAGNOSIS — I10 Essential (primary) hypertension: Secondary | ICD-10-CM | POA: Diagnosis not present

## 2018-04-11 DIAGNOSIS — E11311 Type 2 diabetes mellitus with unspecified diabetic retinopathy with macular edema: Secondary | ICD-10-CM

## 2018-04-11 DIAGNOSIS — E113513 Type 2 diabetes mellitus with proliferative diabetic retinopathy with macular edema, bilateral: Secondary | ICD-10-CM | POA: Diagnosis not present

## 2018-04-11 DIAGNOSIS — H43813 Vitreous degeneration, bilateral: Secondary | ICD-10-CM | POA: Diagnosis not present

## 2018-04-11 DIAGNOSIS — H35033 Hypertensive retinopathy, bilateral: Secondary | ICD-10-CM | POA: Diagnosis not present

## 2018-04-11 DIAGNOSIS — H2513 Age-related nuclear cataract, bilateral: Secondary | ICD-10-CM | POA: Diagnosis not present

## 2018-04-11 NOTE — Telephone Encounter (Signed)
  Follow up Call-  Call back number 04/10/2018  Post procedure Call Back phone  # [phone]  Permission to leave phone message Yes  Some recent data might be hidden     Patient questions:  Do you have a fever, pain , or abdominal swelling? No. Pain Score  0 *  Have you tolerated food without any problems? Yes.    Have you been able to return to your normal activities? Yes.    Do you have any questions about your discharge instructions: Diet   No. Medications  No. Follow up visit  No.  Do you have questions or concerns about your Care? No.  Actions: * If pain score is 4 or above: No action needed, pain <4.

## 2018-04-13 ENCOUNTER — Encounter: Payer: Self-pay | Admitting: Gastroenterology

## 2018-04-13 ENCOUNTER — Encounter: Payer: Self-pay | Admitting: Internal Medicine

## 2018-04-13 ENCOUNTER — Telehealth: Payer: Self-pay | Admitting: Internal Medicine

## 2018-04-13 ENCOUNTER — Ambulatory Visit (INDEPENDENT_AMBULATORY_CARE_PROVIDER_SITE_OTHER): Payer: Medicare Other | Admitting: Gastroenterology

## 2018-04-13 VITALS — BP 144/74 | HR 80 | Ht 62.0 in | Wt 194.0 lb

## 2018-04-13 DIAGNOSIS — R1032 Left lower quadrant pain: Secondary | ICD-10-CM

## 2018-04-13 DIAGNOSIS — R112 Nausea with vomiting, unspecified: Secondary | ICD-10-CM

## 2018-04-13 MED ORDER — ONDANSETRON HCL 4 MG PO TABS
4.0000 mg | ORAL_TABLET | Freq: Three times a day (TID) | ORAL | 0 refills | Status: DC | PRN
Start: 2018-04-13 — End: 2018-08-16

## 2018-04-13 NOTE — Telephone Encounter (Signed)
Patient scheduled for 2:45 today.

## 2018-04-13 NOTE — Telephone Encounter (Signed)
Had colonoscopy and cold snare polypectomy 6/18  Since prepping nauseated and also having some vomiting - "mild" Feels very light-headed  Small loose BM's  NO pain, fever, bleeding  I thinks she should be seen - I can see her this AM if Dr. Loletha Carrow or an APP unable to see her   Dr. Loletha Carrow does have a 245 open slot

## 2018-04-13 NOTE — Patient Instructions (Signed)
If you are age 69 or older, your body mass index should be between 23-30. Your Body mass index is 35.48 kg/m. If this is out of the aforementioned range listed, please consider follow up with your Primary Care Provider.  If you are age 73 or younger, your body mass index should be between 19-25. Your Body mass index is 35.48 kg/m. If this is out of the aformentioned range listed, please consider follow up with your Primary Care Provider.   It was a pleasure to see you today!  Dr. Loletha Carrow

## 2018-04-13 NOTE — Progress Notes (Signed)
[facility] Progress Note  Chief Complaint: Heartburn and diarrhea.  Subjective  History:  This is a 67 year old woman who just had a routine colonoscopy with me 3 days ago.  3 diminutive polyps were removed.  She reports that ever since taking the bowel preparation, she has had nausea with intermittent vomiting, some left lower quadrant pain and loose stool. She describes a long-standing history of some intermittent left lower quadrant pain that I think Dr. Deatra Ina thought may be IBS, since he had previously prescribed Levbid.  Last evening she was nauseated with bilious vomiting.  ROS: Cardiovascular:  no chest pain Respiratory: no dyspnea  The patient's Past Medical, Family and Social History were reviewed and are on file in the EMR.  Objective:  Med list reviewed  Current Outpatient Medications:  .  albuterol (PROVENTIL HFA;VENTOLIN HFA) 108 (90 BASE) MCG/ACT inhaler, Inhale 2 puffs into the lungs every 4 (four) hours as needed for wheezing or shortness of breath., Disp: 1 Inhaler, Rfl: 3 .  aspirin 81 MG tablet, Take 81 mg by mouth daily., Disp: , Rfl:  .  b complex vitamins capsule, Take 1 capsule by mouth daily., Disp: , Rfl:  .  BESIVANCE 0.6 % SUSP, Place 1 drop into both eyes daily., Disp: , Rfl: 12 .  bisacodyl (BISACODYL) 5 MG EC tablet, Take 5 mg by mouth daily as needed for moderate constipation., Disp: , Rfl:  .  Cholecalciferol (VITAMIN D3 PO), Take 2,000 Int'l Units by mouth daily., Disp: , Rfl:  .  [employer] CINNAMON PO, Take 250 mg by mouth 2 (two) times daily. , Disp: , Rfl:  .  fluticasone (FLONASE) 50 MCG/ACT nasal spray, Place 2 sprays into the nose daily as needed for allergies. , Disp: , Rfl:  .  glimepiride (AMARYL) 4 MG tablet, Take 4 mg by mouth 2 (two) times daily., Disp: , Rfl:  .  hyoscyamine (LEVSIN SL) 0.125 MG SL tablet, Place 1 tablet (0.125 mg total) under the tongue every 6 (six) hours as needed., Disp: 30 tablet, Rfl: 2 .  lactobacillus  acidophilus (BACID) TABS, Take 1 tablet by mouth daily. PROBIOTIC, Disp: , Rfl:  .  magnesium oxide (MAG-OX) 400 (241.3 Mg) MG tablet, Take 1 tablet by mouth daily., Disp: , Rfl:  .  Manganese 10 MG TABS, Take 1 tablet by mouth daily. , Disp: , Rfl:  .  milk thistle 175 MG tablet, Take 525 mg by mouth daily. Takes 3 daily, Disp: , Rfl:  .  mometasone-formoterol (DULERA) 100-5 MCG/ACT AERO, Inhale 2 puffs into the lungs 2 (two) times daily., Disp: , Rfl:  .  ONETOUCH VERIO test strip, CHECK BS ONCE DAILY UTD, Disp: , Rfl: 11 .  OVER THE COUNTER MEDICATION, Take 2 capsules by mouth 2 (two) times daily. OCUGAURD PLUS, Disp: , Rfl:  .  OVER THE COUNTER MEDICATION, Place 1 drop into both eyes daily as needed (dry eyes). THERA TEARS, Disp: , Rfl:  .  pantoprazole (PROTONIX) 40 MG tablet, Take 40 mg by mouth daily., Disp: , Rfl:  .  sitaGLIPtin-metformin (JANUMET) 50-1000 MG tablet, Take 1 tablet by mouth 2 (two) times daily with a meal., Disp: , Rfl:  .  valsartan-hydrochlorothiazide (DIOVAN HCT) 320-12.5 MG tablet, Take 1 tablet by mouth daily., Disp: 90 tablet, Rfl: 3 .  ondansetron (ZOFRAN) 4 MG tablet, Take 1 tablet (4 mg total) by mouth every 8 (eight) hours as needed for nausea or vomiting., Disp: 12 tablet, Rfl: 0 .  rosuvastatin (CRESTOR) 5 MG tablet, Take 1 tablet (5 mg total) by mouth daily., Disp: 90 tablet, Rfl: 3   Vital signs in last 24 hrs: Vitals:   04/13/18 1500  BP: (!) 144/74  Pulse: 80    Physical Exam  She is accompanied by her husband, a retired ophthalmologist Antalgic gait, gets on exam table slowly but without assistance  HEENT: sclera anicteric, oral mucosa moist without lesions  Neck: supple, no thyromegaly, JVD or lymphadenopathy  Cardiac: RRR without murmurs, S1S2 heard,  Pulm: clear to auscultation bilaterally, normal RR and effort noted  Abdomen: soft, no tenderness, with active bowel sounds.  No appreciable hepatosplenomegaly, limited by body habitus.  No  distention.  Skin; warm and dry, no jaundice or rash    @ASSESSMENTPLANBEGIN @ Assessment: Encounter Diagnoses  Name Primary?  Marland Kitchen LLQ abdominal pain Yes  . Nausea and vomiting, intractability of vomiting not specified, unspecified vomiting type    She has nonspecific lingering symptoms since bowel preparation and colonoscopy.  No evidence of obstruction, and it sounds like she has some mild underlying IBS.  I suspect this will get better on its own, and I gave her a prescription for Zofran to take as needed over the weekend.  She will call as needed.  Total time 15 minutes, over half spent face-to-face with patient in counseling and coordination of care.   Carolyn Russo

## 2018-04-14 ENCOUNTER — Encounter: Payer: Self-pay | Admitting: Gastroenterology

## 2018-04-17 ENCOUNTER — Other Ambulatory Visit: Payer: Self-pay | Admitting: Interventional Cardiology

## 2018-05-04 ENCOUNTER — Ambulatory Visit: Payer: Medicare Other

## 2018-05-09 ENCOUNTER — Encounter (INDEPENDENT_AMBULATORY_CARE_PROVIDER_SITE_OTHER): Payer: Medicare Other | Admitting: Ophthalmology

## 2018-05-09 DIAGNOSIS — E11311 Type 2 diabetes mellitus with unspecified diabetic retinopathy with macular edema: Secondary | ICD-10-CM | POA: Diagnosis not present

## 2018-05-09 DIAGNOSIS — H43813 Vitreous degeneration, bilateral: Secondary | ICD-10-CM

## 2018-05-09 DIAGNOSIS — H35033 Hypertensive retinopathy, bilateral: Secondary | ICD-10-CM

## 2018-05-09 DIAGNOSIS — H2513 Age-related nuclear cataract, bilateral: Secondary | ICD-10-CM | POA: Diagnosis not present

## 2018-05-09 DIAGNOSIS — E113513 Type 2 diabetes mellitus with proliferative diabetic retinopathy with macular edema, bilateral: Secondary | ICD-10-CM | POA: Diagnosis not present

## 2018-05-09 DIAGNOSIS — I10 Essential (primary) hypertension: Secondary | ICD-10-CM

## 2018-05-28 ENCOUNTER — Other Ambulatory Visit: Payer: Self-pay

## 2018-05-29 ENCOUNTER — Ambulatory Visit
Admission: RE | Admit: 2018-05-29 | Discharge: 2018-05-29 | Disposition: A | Discharge status: Home / Self Care | Payer: Medicare Other | Source: Ambulatory Visit | Attending: Internal Medicine | Admitting: Internal Medicine

## 2018-05-29 DIAGNOSIS — Z1231 Encounter for screening mammogram for malignant neoplasm of breast: Secondary | ICD-10-CM

## 2018-05-29 DIAGNOSIS — E1122 Type 2 diabetes mellitus with diabetic chronic kidney disease: Secondary | ICD-10-CM | POA: Diagnosis not present

## 2018-05-30 DIAGNOSIS — E8770 Fluid overload, unspecified: Secondary | ICD-10-CM | POA: Diagnosis not present

## 2018-05-30 DIAGNOSIS — N183 Chronic kidney disease, stage 3 (moderate): Secondary | ICD-10-CM | POA: Diagnosis not present

## 2018-05-30 DIAGNOSIS — I129 Hypertensive chronic kidney disease with stage 1 through stage 4 chronic kidney disease, or unspecified chronic kidney disease: Secondary | ICD-10-CM | POA: Diagnosis not present

## 2018-05-30 DIAGNOSIS — E669 Obesity, unspecified: Secondary | ICD-10-CM | POA: Diagnosis not present

## 2018-06-07 ENCOUNTER — Encounter (INDEPENDENT_AMBULATORY_CARE_PROVIDER_SITE_OTHER): Payer: Medicare Other | Admitting: Ophthalmology

## 2018-06-08 ENCOUNTER — Encounter (INDEPENDENT_AMBULATORY_CARE_PROVIDER_SITE_OTHER): Payer: Medicare Other | Admitting: Ophthalmology

## 2018-06-08 DIAGNOSIS — E113592 Type 2 diabetes mellitus with proliferative diabetic retinopathy without macular edema, left eye: Secondary | ICD-10-CM | POA: Diagnosis not present

## 2018-06-08 DIAGNOSIS — E11311 Type 2 diabetes mellitus with unspecified diabetic retinopathy with macular edema: Secondary | ICD-10-CM

## 2018-06-08 DIAGNOSIS — I10 Essential (primary) hypertension: Secondary | ICD-10-CM

## 2018-06-08 DIAGNOSIS — H2513 Age-related nuclear cataract, bilateral: Secondary | ICD-10-CM | POA: Diagnosis not present

## 2018-06-08 DIAGNOSIS — E113511 Type 2 diabetes mellitus with proliferative diabetic retinopathy with macular edema, right eye: Secondary | ICD-10-CM

## 2018-06-08 DIAGNOSIS — H43813 Vitreous degeneration, bilateral: Secondary | ICD-10-CM

## 2018-06-08 DIAGNOSIS — H35033 Hypertensive retinopathy, bilateral: Secondary | ICD-10-CM

## 2018-07-02 DIAGNOSIS — E669 Obesity, unspecified: Secondary | ICD-10-CM | POA: Diagnosis not present

## 2018-07-02 DIAGNOSIS — Z91018 Allergy to other foods: Secondary | ICD-10-CM | POA: Diagnosis not present

## 2018-07-02 DIAGNOSIS — E11311 Type 2 diabetes mellitus with unspecified diabetic retinopathy with macular edema: Secondary | ICD-10-CM | POA: Diagnosis not present

## 2018-07-02 DIAGNOSIS — Z91013 Allergy to seafood: Secondary | ICD-10-CM | POA: Diagnosis not present

## 2018-07-02 DIAGNOSIS — Z7982 Long term (current) use of aspirin: Secondary | ICD-10-CM | POA: Diagnosis not present

## 2018-07-02 DIAGNOSIS — I10 Essential (primary) hypertension: Secondary | ICD-10-CM | POA: Diagnosis not present

## 2018-07-02 DIAGNOSIS — Z79899 Other long term (current) drug therapy: Secondary | ICD-10-CM | POA: Diagnosis not present

## 2018-07-02 DIAGNOSIS — E119 Type 2 diabetes mellitus without complications: Secondary | ICD-10-CM | POA: Diagnosis not present

## 2018-07-02 DIAGNOSIS — I2510 Atherosclerotic heart disease of native coronary artery without angina pectoris: Secondary | ICD-10-CM | POA: Diagnosis not present

## 2018-07-02 DIAGNOSIS — Z6832 Body mass index (BMI) 32.0-32.9, adult: Secondary | ICD-10-CM | POA: Diagnosis not present

## 2018-07-02 DIAGNOSIS — H2512 Age-related nuclear cataract, left eye: Secondary | ICD-10-CM | POA: Diagnosis not present

## 2018-07-02 DIAGNOSIS — Z7984 Long term (current) use of oral hypoglycemic drugs: Secondary | ICD-10-CM | POA: Diagnosis not present

## 2018-07-02 DIAGNOSIS — E785 Hyperlipidemia, unspecified: Secondary | ICD-10-CM | POA: Diagnosis not present

## 2018-07-02 DIAGNOSIS — H2523 Age-related cataract, morgagnian type, bilateral: Secondary | ICD-10-CM | POA: Diagnosis not present

## 2018-07-02 DIAGNOSIS — H5789 Other specified disorders of eye and adnexa: Secondary | ICD-10-CM | POA: Diagnosis not present

## 2018-07-02 DIAGNOSIS — Z888 Allergy status to other drugs, medicaments and biological substances status: Secondary | ICD-10-CM | POA: Diagnosis not present

## 2018-07-02 DIAGNOSIS — J45909 Unspecified asthma, uncomplicated: Secondary | ICD-10-CM | POA: Diagnosis not present

## 2018-07-02 DIAGNOSIS — E1165 Type 2 diabetes mellitus with hyperglycemia: Secondary | ICD-10-CM | POA: Diagnosis not present

## 2018-07-03 DIAGNOSIS — Z961 Presence of intraocular lens: Secondary | ICD-10-CM | POA: Insufficient documentation

## 2018-07-03 DIAGNOSIS — H2511 Age-related nuclear cataract, right eye: Secondary | ICD-10-CM | POA: Insufficient documentation

## 2018-07-03 DIAGNOSIS — H40022 Open angle with borderline findings, high risk, left eye: Secondary | ICD-10-CM | POA: Insufficient documentation

## 2018-07-09 DIAGNOSIS — N183 Chronic kidney disease, stage 3 (moderate): Secondary | ICD-10-CM | POA: Diagnosis not present

## 2018-07-09 DIAGNOSIS — E1122 Type 2 diabetes mellitus with diabetic chronic kidney disease: Secondary | ICD-10-CM | POA: Diagnosis not present

## 2018-07-09 DIAGNOSIS — I2510 Atherosclerotic heart disease of native coronary artery without angina pectoris: Secondary | ICD-10-CM

## 2018-07-09 DIAGNOSIS — I1310 Hypertensive heart and chronic kidney disease without heart failure, with stage 1 through stage 4 chronic kidney disease, or unspecified chronic kidney disease: Secondary | ICD-10-CM | POA: Diagnosis not present

## 2018-07-09 DIAGNOSIS — N08 Glomerular disorders in diseases classified elsewhere: Secondary | ICD-10-CM | POA: Diagnosis not present

## 2018-07-12 DIAGNOSIS — N183 Chronic kidney disease, stage 3 (moderate): Secondary | ICD-10-CM | POA: Diagnosis not present

## 2018-07-12 DIAGNOSIS — E669 Obesity, unspecified: Secondary | ICD-10-CM | POA: Diagnosis not present

## 2018-07-12 DIAGNOSIS — E1165 Type 2 diabetes mellitus with hyperglycemia: Secondary | ICD-10-CM | POA: Diagnosis not present

## 2018-07-12 DIAGNOSIS — I129 Hypertensive chronic kidney disease with stage 1 through stage 4 chronic kidney disease, or unspecified chronic kidney disease: Secondary | ICD-10-CM | POA: Diagnosis not present

## 2018-07-12 DIAGNOSIS — E8770 Fluid overload, unspecified: Secondary | ICD-10-CM | POA: Diagnosis not present

## 2018-07-12 DIAGNOSIS — E1122 Type 2 diabetes mellitus with diabetic chronic kidney disease: Secondary | ICD-10-CM | POA: Diagnosis not present

## 2018-07-13 ENCOUNTER — Other Ambulatory Visit: Payer: Self-pay | Admitting: Interventional Cardiology

## 2018-07-16 DIAGNOSIS — Z9842 Cataract extraction status, left eye: Secondary | ICD-10-CM | POA: Diagnosis not present

## 2018-07-16 DIAGNOSIS — I10 Essential (primary) hypertension: Secondary | ICD-10-CM | POA: Diagnosis not present

## 2018-07-16 DIAGNOSIS — E1136 Type 2 diabetes mellitus with diabetic cataract: Secondary | ICD-10-CM | POA: Diagnosis not present

## 2018-07-16 DIAGNOSIS — J45909 Unspecified asthma, uncomplicated: Secondary | ICD-10-CM | POA: Diagnosis not present

## 2018-07-16 DIAGNOSIS — H25811 Combined forms of age-related cataract, right eye: Secondary | ICD-10-CM | POA: Diagnosis not present

## 2018-07-16 DIAGNOSIS — Z6832 Body mass index (BMI) 32.0-32.9, adult: Secondary | ICD-10-CM | POA: Diagnosis not present

## 2018-07-16 DIAGNOSIS — Z79899 Other long term (current) drug therapy: Secondary | ICD-10-CM | POA: Diagnosis not present

## 2018-07-16 DIAGNOSIS — E11311 Type 2 diabetes mellitus with unspecified diabetic retinopathy with macular edema: Secondary | ICD-10-CM | POA: Diagnosis not present

## 2018-07-16 DIAGNOSIS — Z7984 Long term (current) use of oral hypoglycemic drugs: Secondary | ICD-10-CM | POA: Diagnosis not present

## 2018-07-16 DIAGNOSIS — E669 Obesity, unspecified: Secondary | ICD-10-CM | POA: Diagnosis not present

## 2018-07-16 DIAGNOSIS — H2511 Age-related nuclear cataract, right eye: Secondary | ICD-10-CM | POA: Diagnosis not present

## 2018-07-16 DIAGNOSIS — Z7982 Long term (current) use of aspirin: Secondary | ICD-10-CM | POA: Diagnosis not present

## 2018-07-16 DIAGNOSIS — Z961 Presence of intraocular lens: Secondary | ICD-10-CM | POA: Diagnosis not present

## 2018-07-16 DIAGNOSIS — E119 Type 2 diabetes mellitus without complications: Secondary | ICD-10-CM | POA: Diagnosis not present

## 2018-07-16 DIAGNOSIS — E1165 Type 2 diabetes mellitus with hyperglycemia: Secondary | ICD-10-CM | POA: Diagnosis not present

## 2018-07-16 DIAGNOSIS — I2510 Atherosclerotic heart disease of native coronary artery without angina pectoris: Secondary | ICD-10-CM | POA: Diagnosis not present

## 2018-07-17 DIAGNOSIS — Z961 Presence of intraocular lens: Secondary | ICD-10-CM | POA: Insufficient documentation

## 2018-07-19 ENCOUNTER — Encounter (INDEPENDENT_AMBULATORY_CARE_PROVIDER_SITE_OTHER): Payer: Medicare Other | Admitting: Ophthalmology

## 2018-08-03 ENCOUNTER — Other Ambulatory Visit: Payer: Self-pay

## 2018-08-06 ENCOUNTER — Ambulatory Visit: Payer: Medicare Other | Admitting: Internal Medicine

## 2018-08-16 ENCOUNTER — Encounter: Payer: Self-pay | Admitting: Internal Medicine

## 2018-08-16 ENCOUNTER — Ambulatory Visit (INDEPENDENT_AMBULATORY_CARE_PROVIDER_SITE_OTHER): Payer: Medicare Other | Admitting: Internal Medicine

## 2018-08-16 VITALS — BP 126/72 | HR 85 | Temp 97.5°F | Ht 62.0 in | Wt 179.0 lb

## 2018-08-16 DIAGNOSIS — E1122 Type 2 diabetes mellitus with diabetic chronic kidney disease: Secondary | ICD-10-CM | POA: Diagnosis not present

## 2018-08-16 DIAGNOSIS — Z23 Encounter for immunization: Secondary | ICD-10-CM

## 2018-08-16 DIAGNOSIS — Z6832 Body mass index (BMI) 32.0-32.9, adult: Secondary | ICD-10-CM

## 2018-08-16 DIAGNOSIS — E661 Drug-induced obesity: Secondary | ICD-10-CM | POA: Diagnosis not present

## 2018-08-16 DIAGNOSIS — N183 Chronic kidney disease, stage 3 unspecified: Secondary | ICD-10-CM

## 2018-08-16 LAB — HEMOGLOBIN A1C
Est. average glucose Bld gHb Est-mCnc: 321 mg/dL
Hgb A1c MFr Bld: 12.8 % — ABNORMAL HIGH (ref 4.8–5.6)

## 2018-08-16 NOTE — Patient Instructions (Signed)
Diabetes Mellitus and Exercise Exercising regularly is important for your overall health, especially when you have diabetes (diabetes mellitus). Exercising is not only about losing weight. It has many health benefits, such as increasing muscle strength and bone density and reducing body fat and stress. This leads to improved fitness, flexibility, and endurance, all of which result in better overall health. Exercise has additional benefits for people with diabetes, including:  Reducing appetite.  Helping to lower and control blood glucose.  Lowering blood pressure.  Helping to control amounts of fatty substances (lipids) in the blood, such as cholesterol and triglycerides.  Helping the body to respond better to insulin (improving insulin sensitivity).  Reducing how much insulin the body needs.  Decreasing the risk for heart disease by: ? Lowering cholesterol and triglyceride levels. ? Increasing the levels of good cholesterol. ? Lowering blood glucose levels.  What is my activity plan? Your health care provider or certified diabetes educator can help you make a plan for the type and frequency of exercise (activity plan) that works for you. Make sure that you:  Do at least 150 minutes of moderate-intensity or vigorous-intensity exercise each week. This could be brisk walking, biking, or water aerobics. ? Do stretching and strength exercises, such as yoga or weightlifting, at least 2 times a week. ? Spread out your activity over at least 3 days of the week.  Get some form of physical activity every day. ? Do not go more than 2 days in a row without some kind of physical activity. ? Avoid being inactive for more than 90 minutes at a time. Take frequent breaks to walk or stretch.  Choose a type of exercise or activity that you enjoy, and set realistic goals.  Start slowly, and gradually increase the intensity of your exercise over time.  What do I need to know about managing my  diabetes?  Check your blood glucose before and after exercising. ? If your blood glucose is higher than 240 mg/dL (13.3 mmol/L) before you exercise, check your urine for ketones. If you have ketones in your urine, do not exercise until your blood glucose returns to normal.  Know the symptoms of low blood glucose (hypoglycemia) and how to treat it. Your risk for hypoglycemia increases during and after exercise. Common symptoms of hypoglycemia can include: ? Hunger. ? Anxiety. ? Sweating and feeling clammy. ? Confusion. ? Dizziness or feeling light-headed. ? Increased heart rate or palpitations. ? Blurry vision. ? Tingling or numbness around the mouth, lips, or tongue. ? Tremors or shakes. ? Irritability.  Keep a rapid-acting carbohydrate snack available before, during, and after exercise to help prevent or treat hypoglycemia.  Avoid injecting insulin into areas of the body that are going to be exercised. For example, avoid injecting insulin into: ? The arms, when playing tennis. ? The legs, when jogging.  Keep records of your exercise habits. Doing this can help you and your health care provider adjust your diabetes management plan as needed. Write down: ? Food that you eat before and after you exercise. ? Blood glucose levels before and after you exercise. ? The type and amount of exercise you have done. ? When your insulin is expected to peak, if you use insulin. Avoid exercising at times when your insulin is peaking.  When you start a new exercise or activity, work with your health care provider to make sure the activity is safe for you, and to adjust your insulin, medicines, or food intake as needed.    Drink plenty of water while you exercise to prevent dehydration or heat stroke. Drink enough fluid to keep your urine clear or pale yellow. This information is not intended to replace advice given to you by your health care provider. Make sure you discuss any questions you have with  your health care provider. Document Released: 12/31/2003 Document Revised: 04/29/2016 Document Reviewed: 03/21/2016 [employer] Interactive Patient Education  2018 [employer]  

## 2018-08-16 NOTE — Progress Notes (Signed)
Subjective:     Patient ID: Carolyn Russo , female    DOB: [birth year] , 67 y.o.   MRN: [id number]   Chief Complaint  Patient presents with  . Trulicity f/u    HPI  She was started on Trulicity at her last visit. She has tolerated the medication without any major issues. She does have some itching at the injection site. She has noticed that her sugars have improved from 400s down to 220s.     Past Medical History:  Diagnosis Date  . Allergy   . Anterior chest wall pain   . Appendicitis 1965  . Asthma   . Body mass index 37.0-37.9, adult   . Breast pain   . Cataract    both eyes  . Chronic renal disease, stage II   . Dehydration 2014  . Deviated septum 1971  . Diabetes mellitus   . Dyspnea 2014  . Extrinsic asthma    WITH ASTHMA ATTACK  . Fibroid 1980  . GERD (gastroesophageal reflux disease)   . Heart murmur   . Hx gestational diabetes   . Hyperlipidemia   . Hypertension 2014  . Inguinal hernia 1959  . Malaise and fatigue 2014  . Nephropathy   . Non-IgE mediated allergic asthma 2014  . Obesity   . Pelvic pain   . Pregnancy, high-risk 1985  . Tonsillitis 1968  . Uterine fibroid 1980      Current Outpatient Medications:  .  albuterol (PROVENTIL HFA;VENTOLIN HFA) 108 (90 BASE) MCG/ACT inhaler, Inhale 2 puffs into the lungs every 4 (four) hours as needed for wheezing or shortness of breath., Disp: 1 Inhaler, Rfl: 3 .  aspirin 81 MG tablet, Take 81 mg by mouth daily., Disp: , Rfl:  .  b complex vitamins capsule, Take 1 capsule by mouth daily., Disp: , Rfl:  .  bisacodyl (BISACODYL) 5 MG EC tablet, Take 5 mg by mouth daily as needed for moderate constipation., Disp: , Rfl:  .  budesonide-formoterol (SYMBICORT) 160-4.5 MCG/ACT inhaler, Inhale 2 puffs into the lungs 2 (two) times daily., Disp: , Rfl:  .  Cholecalciferol (VITAMIN D3 PO), Take 2,000 Int'l Units by mouth daily., Disp: , Rfl:  .  [employer] CINNAMON PO, Take 250 mg by mouth 2 (two) times daily. , Disp: , Rfl:   .  Dulaglutide (TRULICITY) 1.5 KX/3.8HW SOPN, Inject into the skin., Disp: , Rfl:  .  fluticasone (FLONASE) 50 MCG/ACT nasal spray, Place 2 sprays into the nose daily as needed for allergies. , Disp: , Rfl:  .  furosemide (LASIX) 40 MG tablet, Take 40 mg by mouth., Disp: , Rfl:  .  lactobacillus acidophilus (BACID) TABS, Take 1 tablet by mouth daily. PROBIOTIC, Disp: , Rfl:  .  magnesium oxide (MAG-OX) 400 (241.3 Mg) MG tablet, Take 1 tablet by mouth daily., Disp: , Rfl:  .  milk thistle 175 MG tablet, Take 525 mg by mouth daily. Takes 3 daily, Disp: , Rfl:  .  ONETOUCH VERIO test strip, CHECK BS ONCE DAILY UTD, Disp: , Rfl: 11 .  OVER THE COUNTER MEDICATION, Take 2 capsules by mouth 2 (two) times daily. OCUGAURD PLUS, Disp: , Rfl:  .  OVER THE COUNTER MEDICATION, Place 1 drop into both eyes daily as needed (dry eyes). THERA TEARS, Disp: , Rfl:  .  rosuvastatin (CRESTOR) 5 MG tablet, Take 1 tablet (5 mg total) by mouth daily., Disp: 90 tablet, Rfl: 3 .  sitaGLIPtin-metformin (JANUMET) 50-1000 MG tablet, Take 1 tablet  by mouth 2 (two) times daily with a meal., Disp: , Rfl:  .  valsartan-hydrochlorothiazide (DIOVAN-HCT) 320-12.5 MG tablet, Take 1 tablet by mouth daily. Please make overdue appt with Dr. Tamala Julian or refill with PCP. 1st attempt, Disp: 30 tablet, Rfl: 0   Allergies  Allergen Reactions  . Food Anaphylaxis    Peanuts; Almonds  . Other Shortness Of Breath    Peanuts; Almonds  . Wheat Bran Shortness Of Breath  . Statins Itching and Other (See Comments)    Generalized aches  . Lobster [Shellfish Allergy]     Mouth gets raw  . Tetracyclines & Related Itching  . Contrast Media [Iodinated Diagnostic Agents] Rash    Happened during CT scan over 30 years ago     Review of Systems  Constitutional: Negative.   Eyes: Negative.   Respiratory: Negative.   Cardiovascular: Negative.   Gastrointestinal: Negative.   Psychiatric/Behavioral: Negative.      Today's Vitals   08/16/18 1423   BP: 126/72  Pulse: 85  Temp: (!) 97.5 F (36.4 C)  TempSrc: Oral  Weight: 179 lb (81.2 kg)  Height: 5\' 2"  (1.575 m)  PainSc: 0-No pain   Body mass index is 32.74 kg/m.   Objective:  Physical Exam  Constitutional: She is oriented to person, place, and time. She appears well-developed and well-nourished.  Cardiovascular: Normal rate, regular rhythm and normal heart sounds.  Pulmonary/Chest: Effort normal and breath sounds normal.  Neurological: She is alert and oriented to person, place, and time.  Psychiatric: She has a normal mood and affect.  Nursing note and vitals reviewed.       Assessment And Plan:     1. Type 2 diabetes mellitus with stage 3 chronic kidney disease, without long-term current use of insulin ([place])  I will check an A1c today.  Last a1c Sept 2019 was 14.7. Pt advised our ultimate goal is less than 7.9. She is adamant about avoiding insulin. She is encouraged to resume exercise once cleared by her ophthalmologist.  - Hemoglobin A1c  2. Chronic renal disease, stage III (HCC)  Chronic. She is encouraged to stay well hydrated. She is also followed by Nephrology.   3. Class 1 drug-induced obesity with serious comorbidity and body mass index (BMI) of 32.0 to 32.9 in adult  She was congratulated on her 5 pound weight loss. She is encouraged to strive for BMI less than 30 to decrease cardiac risk.   4. Needs flu shot  She was given high dose flu vaccine.   Maximino Greenland, MD

## 2018-08-20 NOTE — Progress Notes (Signed)
Your hba1c is down to 12.8 from 14.7. This is an improvement! Please incorporate more exercise as we discussed in yoru visit. KW-pls be sure she has appt scheduled in dec for dm check. Also confirm she is on 1.5mg  Trulicity. I would like to add nighttime insulin to her current regimen to help bring her a1c to goal. Has she reconsidered?

## 2018-08-23 ENCOUNTER — Telehealth: Payer: Self-pay

## 2018-08-23 NOTE — Telephone Encounter (Signed)
-----   Message from [name], MD sent at 08/20/2018 12:10 AM EDT ----- Your hba1c is down to 12.8 from 14.7. This is an improvement! Please incorporate more exercise as we discussed in yoru visit. KW-pls be sure she has appt scheduled in dec for dm check. Also confirm she is on 1.5mg  Trulicity. I would like to add nighttime insulin to her current regimen to help bring her a1c to goal. Has she reconsidered?

## 2018-08-23 NOTE — Telephone Encounter (Signed)
Left the pt a message to call back for her lab results. 

## 2018-08-29 ENCOUNTER — Encounter (INDEPENDENT_AMBULATORY_CARE_PROVIDER_SITE_OTHER): Payer: Medicare Other | Admitting: Ophthalmology

## 2018-08-29 DIAGNOSIS — E113513 Type 2 diabetes mellitus with proliferative diabetic retinopathy with macular edema, bilateral: Secondary | ICD-10-CM | POA: Diagnosis not present

## 2018-08-29 DIAGNOSIS — H4312 Vitreous hemorrhage, left eye: Secondary | ICD-10-CM

## 2018-08-29 DIAGNOSIS — H35033 Hypertensive retinopathy, bilateral: Secondary | ICD-10-CM | POA: Diagnosis not present

## 2018-08-29 DIAGNOSIS — H43813 Vitreous degeneration, bilateral: Secondary | ICD-10-CM

## 2018-08-29 DIAGNOSIS — E11311 Type 2 diabetes mellitus with unspecified diabetic retinopathy with macular edema: Secondary | ICD-10-CM

## 2018-08-29 DIAGNOSIS — I10 Essential (primary) hypertension: Secondary | ICD-10-CM | POA: Diagnosis not present

## 2018-09-03 ENCOUNTER — Other Ambulatory Visit: Payer: Self-pay

## 2018-09-03 MED ORDER — DULAGLUTIDE 1.5 MG/0.5ML ~~LOC~~ SOAJ: 1.5000 mg | SUBCUTANEOUS | 1 refills | Status: DC

## 2018-09-04 ENCOUNTER — Encounter: Payer: Self-pay | Admitting: Podiatry

## 2018-09-04 ENCOUNTER — Other Ambulatory Visit: Payer: Self-pay | Admitting: *Deleted

## 2018-09-04 ENCOUNTER — Ambulatory Visit (INDEPENDENT_AMBULATORY_CARE_PROVIDER_SITE_OTHER): Payer: Medicare Other | Admitting: Podiatry

## 2018-09-04 ENCOUNTER — Encounter

## 2018-09-04 ENCOUNTER — Encounter: Payer: Self-pay | Admitting: *Deleted

## 2018-09-04 DIAGNOSIS — M79675 Pain in left toe(s): Secondary | ICD-10-CM

## 2018-09-04 DIAGNOSIS — B351 Tinea unguium: Secondary | ICD-10-CM | POA: Diagnosis not present

## 2018-09-04 DIAGNOSIS — E119 Type 2 diabetes mellitus without complications: Secondary | ICD-10-CM

## 2018-09-04 DIAGNOSIS — M2010 Hallux valgus (acquired), unspecified foot: Secondary | ICD-10-CM | POA: Diagnosis not present

## 2018-09-04 DIAGNOSIS — M79674 Pain in right toe(s): Secondary | ICD-10-CM

## 2018-09-04 DIAGNOSIS — G43909 Migraine, unspecified, not intractable, without status migrainosus: Secondary | ICD-10-CM | POA: Insufficient documentation

## 2018-09-04 DIAGNOSIS — M8580 Other specified disorders of bone density and structure, unspecified site: Secondary | ICD-10-CM | POA: Insufficient documentation

## 2018-09-04 DIAGNOSIS — J45909 Unspecified asthma, uncomplicated: Secondary | ICD-10-CM | POA: Insufficient documentation

## 2018-09-04 NOTE — Progress Notes (Signed)
This patient presents to the office with chief complaint of long thick nails and diabetic feet.  This patient  says there  is  no pain and discomfort in her  feet.  This patient says there are long thick painful nails both big toes.  These nails are painful walking and wearing shoes.  Patient has no history of infection or drainage from both feet.  Patient is unable to  self treat his own nails . This patient presents  to the office today for treatment of the  long nails and a foot evaluation due to history of  diabetes.  General Appearance  Alert, conversant and in no acute stress.  Vascular  Dorsalis pedis and posterior tibial  pulses are palpable  bilaterally.  Capillary return is within normal limits  bilaterally. Temperature is within normal limits  bilaterally.  Neurologic  Senn-Weinstein monofilament wire test within normal limits  bilaterally. Muscle power within normal limits bilaterally.  Nails Thick disfigured discolored nails with subungual debris   hallux  bilaterally. No evidence of bacterial infection or drainage bilaterally.  Orthopedic  No limitations of motion of motion feet .  No crepitus or effusions noted.  No bony pathology or digital deformities noted. Mild  HAV  B/L with hammer toes 2  B/L.  Skin  normotropic skin with no porokeratosis noted bilaterally.  No signs of infections or ulcers noted.     Onychomycosis x 2.   Diabetes with no foot complications  IE  Debride nails x 2.  A diabetic foot exam was performed and there is no evidence of any vascular or neurologic pathology.   RTC 3 months.   Gardiner Barefoot DPM

## 2018-09-19 ENCOUNTER — Ambulatory Visit: Payer: Medicare Other | Admitting: Podiatry

## 2018-09-26 ENCOUNTER — Encounter (INDEPENDENT_AMBULATORY_CARE_PROVIDER_SITE_OTHER): Payer: Medicare Other | Admitting: Ophthalmology

## 2018-09-26 DIAGNOSIS — E113513 Type 2 diabetes mellitus with proliferative diabetic retinopathy with macular edema, bilateral: Secondary | ICD-10-CM

## 2018-09-26 DIAGNOSIS — H43813 Vitreous degeneration, bilateral: Secondary | ICD-10-CM

## 2018-09-26 DIAGNOSIS — H2513 Age-related nuclear cataract, bilateral: Secondary | ICD-10-CM

## 2018-09-26 DIAGNOSIS — I10 Essential (primary) hypertension: Secondary | ICD-10-CM

## 2018-09-26 DIAGNOSIS — H35033 Hypertensive retinopathy, bilateral: Secondary | ICD-10-CM

## 2018-09-26 DIAGNOSIS — E11311 Type 2 diabetes mellitus with unspecified diabetic retinopathy with macular edema: Secondary | ICD-10-CM

## 2018-09-26 DIAGNOSIS — H4312 Vitreous hemorrhage, left eye: Secondary | ICD-10-CM | POA: Diagnosis not present

## 2018-10-10 ENCOUNTER — Ambulatory Visit (INDEPENDENT_AMBULATORY_CARE_PROVIDER_SITE_OTHER): Payer: Medicare Other | Admitting: Internal Medicine

## 2018-10-10 ENCOUNTER — Encounter: Payer: Self-pay | Admitting: Internal Medicine

## 2018-10-10 VITALS — BP 146/82 | HR 73 | Temp 97.3°F | Ht 62.0 in | Wt 180.8 lb

## 2018-10-10 DIAGNOSIS — E6609 Other obesity due to excess calories: Secondary | ICD-10-CM

## 2018-10-10 DIAGNOSIS — Z6833 Body mass index (BMI) 33.0-33.9, adult: Secondary | ICD-10-CM | POA: Diagnosis not present

## 2018-10-10 DIAGNOSIS — N183 Chronic kidney disease, stage 3 unspecified: Secondary | ICD-10-CM

## 2018-10-10 DIAGNOSIS — I1310 Hypertensive heart and chronic kidney disease without heart failure, with stage 1 through stage 4 chronic kidney disease, or unspecified chronic kidney disease: Secondary | ICD-10-CM

## 2018-10-10 DIAGNOSIS — I2510 Atherosclerotic heart disease of native coronary artery without angina pectoris: Secondary | ICD-10-CM

## 2018-10-10 DIAGNOSIS — E1122 Type 2 diabetes mellitus with diabetic chronic kidney disease: Secondary | ICD-10-CM | POA: Diagnosis not present

## 2018-10-11 ENCOUNTER — Other Ambulatory Visit: Payer: Self-pay

## 2018-10-11 LAB — HEMOGLOBIN A1C
Est. average glucose Bld gHb Est-mCnc: 390 mg/dL
Hgb A1c MFr Bld: 15.2 % — ABNORMAL HIGH (ref 4.8–5.6)

## 2018-10-11 LAB — BMP8+EGFR
BUN/Creatinine Ratio: 18 (ref 12–28)
BUN: 33 mg/dL — ABNORMAL HIGH (ref 8–27)
CO2: 20 mmol/L (ref 20–29)
Calcium: 9.8 mg/dL (ref 8.7–10.3)
Chloride: 102 mmol/L (ref 96–106)
Creatinine, Ser: 1.83 mg/dL — ABNORMAL HIGH (ref 0.57–1.00)
GFR calc Af Amer: 32 mL/min/{1.73_m2} — ABNORMAL LOW (ref >59)
GFR calc non Af Amer: 28 mL/min/{1.73_m2} — ABNORMAL LOW (ref >59)
Glucose: 406 mg/dL — ABNORMAL HIGH (ref 65–99)
Potassium: 4.3 mmol/L (ref 3.5–5.2)
Sodium: 135 mmol/L (ref 134–144)

## 2018-10-11 MED ORDER — GLUCOSE BLOOD VI STRP: ORAL_STRIP | 11 refills | Status: DC

## 2018-10-16 ENCOUNTER — Telehealth: Payer: Self-pay

## 2018-10-16 DIAGNOSIS — E1159 Type 2 diabetes mellitus with other circulatory complications: Secondary | ICD-10-CM

## 2018-10-16 NOTE — Telephone Encounter (Signed)
The pt was told that her a1c is 15.2 and was asked if she is willing to start insulin.  The pt said no and that she is going to talk to her husband about it.  The pt was told that Dr. Baird Cancer is going to set the pt up a referral with a diabetes specialist.

## 2018-10-21 NOTE — Progress Notes (Signed)
Subjective:     Patient ID: Carolyn Russo , female    DOB: 1951-09-11 , 67 y.o.   MRN: [id number]   Chief Complaint  Patient presents with  . Trulicity f/u  . Hypertension    HPI  She presents today for f/u diabetes. She was started on Trulicity in the past 3 months. She has tolerated the medication without any side effects. She has not started to exercise yet due to retinal detachment. She is adamant about not taking insulin. She is accompanied by her husband today.   Hypertension  This is a chronic problem. The current episode started more than 1 year ago. The problem has been gradually improving since onset. The problem is uncontrolled. Pertinent negatives include no chest pain, headaches, palpitations or shortness of breath. Risk factors for coronary artery disease include diabetes mellitus, dyslipidemia, obesity, post-menopausal state and sedentary lifestyle.   She reports compliance with meds.   Past Medical History:  Diagnosis Date  . Allergy   . Anterior chest wall pain   . Appendicitis 1965  . Asthma   . Body mass index 37.0-37.9, adult   . Breast pain   . Cataract    both eyes  . Chronic renal disease, stage II   . Dehydration 2014  . Deviated septum 1971  . Diabetes mellitus   . Dyspnea 2014  . Extrinsic asthma    WITH ASTHMA ATTACK  . Fibroid 1980  . GERD (gastroesophageal reflux disease)   . Heart murmur   . Hx gestational diabetes   . Hyperlipidemia   . Hypertension 2014  . Inguinal hernia 1959  . Malaise and fatigue 2014  . Nephropathy   . Non-IgE mediated allergic asthma 2014  . Obesity   . Pelvic pain   . Pregnancy, high-risk 1985  . Tonsillitis 1968  . Uterine fibroid 1980     Family History  Problem Relation Age of Onset  . Cancer Mother        abdominal melamona  . Psoriasis Mother   . Alzheimer's disease Father   . Cancer Cousin        colon   . Diabetes Cousin   . Diabetes Maternal Aunt   . Colon cancer Neg Hx   . Colon polyps Neg  Hx   . Esophageal cancer Neg Hx   . Rectal cancer Neg Hx   . Stomach cancer Neg Hx   . Breast cancer Neg Hx      Current Outpatient Medications:  .  albuterol (PROVENTIL HFA;VENTOLIN HFA) 108 (90 BASE) MCG/ACT inhaler, Inhale 2 puffs into the lungs every 4 (four) hours as needed for wheezing or shortness of breath., Disp: 1 Inhaler, Rfl: 3 .  aspirin 81 MG tablet, Take 81 mg by mouth daily., Disp: , Rfl:  .  b complex vitamins capsule, Take 1 capsule by mouth daily., Disp: , Rfl:  .  bisacodyl (BISACODYL) 5 MG EC tablet, Take 5 mg by mouth daily as needed for moderate constipation., Disp: , Rfl:  .  budesonide-formoterol (SYMBICORT) 160-4.5 MCG/ACT inhaler, Inhale 2 puffs into the lungs 2 (two) times daily., Disp: , Rfl:  .  Cholecalciferol (VITAMIN D3 PO), Take 2,000 Int'l Units by mouth daily., Disp: , Rfl:  .  [employer]S CINNAMON PO, Take 250 mg by mouth 2 (two) times daily. , Disp: , Rfl:  .  cyanocobalamin (CVS VITAMIN B12) 2000 MCG tablet, Take by mouth., Disp: , Rfl:  .  Dulaglutide (TRULICITY) 1.5 WC/3.7SE SOPN, Inject 1.5  mg into the skin once a week., Disp: 4 pen, Rfl: 1 .  fluticasone (FLONASE) 50 MCG/ACT nasal spray, Place 2 sprays into the nose daily as needed for allergies. , Disp: , Rfl:  .  furosemide (LASIX) 40 MG tablet, Take 40 mg by mouth., Disp: , Rfl:  .  glimepiride (AMARYL) 4 MG tablet, Take by mouth., Disp: , Rfl:  .  lactobacillus acidophilus (BACID) TABS, Take 1 tablet by mouth daily. PROBIOTIC, Disp: , Rfl:  .  magnesium oxide (MAG-OX) 400 (241.3 Mg) MG tablet, Take 1 tablet by mouth daily., Disp: , Rfl:  .  milk thistle 175 MG tablet, Take 525 mg by mouth daily. Takes 3 daily, Disp: , Rfl:  .  ONETOUCH VERIO test strip, CHECK BS ONCE DAILY UTD, Disp: , Rfl: 11 .  OVER THE COUNTER MEDICATION, Take 2 capsules by mouth 2 (two) times daily. OCUGAURD PLUS, Disp: , Rfl:  .  Polyvinyl Alcohol-Povidone (REFRESH OP), Apply to eye., Disp: , Rfl:  .  rosuvastatin (CRESTOR) 5 MG  tablet, Take 1 tablet (5 mg total) by mouth daily., Disp: 90 tablet, Rfl: 3 .  triamcinolone cream (KENALOG) 0.1 %, triamcinolone acetonide 0.1 % topical cream, Disp: , Rfl:  .  valsartan-hydrochlorothiazide (DIOVAN-HCT) 320-12.5 MG tablet, Take 1 tablet by mouth daily. Please make overdue appt with Dr. Tamala Julian or refill with PCP. 1st attempt, Disp: 30 tablet, Rfl: 0 .  glucose blood (ACCU-CHEK GUIDE) test strip, Use as instructed to check blood sugars 2 times per day dx: e11.65, Disp: 100 each, Rfl: 11   Allergies  Allergen Reactions  . Food Anaphylaxis    Peanuts; Almonds  . Other Shortness Of Breath    Peanuts; Almonds  . Wheat Bran Shortness Of Breath  . Statins Itching and Other (See Comments)    Generalized aches  . Iodine Other (See Comments)  . Shellfish Allergy Other (See Comments)    Mouth gets raw  . Tetracycline Other (See Comments)    Raw mouth  . Tetracyclines & Related Itching  . Contrast Media [Iodinated Diagnostic Agents] Rash    Happened during CT scan over 30 years ago     Review of Systems  Constitutional: Negative.   Respiratory: Negative.  Negative for shortness of breath.   Cardiovascular: Negative.  Negative for chest pain and palpitations.  Gastrointestinal: Negative.   Neurological: Negative.  Negative for headaches.  Psychiatric/Behavioral: Negative.      Today's Vitals   10/10/18 1529  BP: (!) 146/82  Pulse: 73  Temp: (!) 97.3 F (36.3 C)  TempSrc: Oral  Weight: 180 lb 12.8 oz (82 kg)  Height: '5\' 2"'  (1.575 m)  PainSc: 0-No pain   Body mass index is 33.07 kg/m.   Objective:  Physical Exam Vitals signs and nursing note reviewed.  Constitutional:      Appearance: Normal appearance. She is obese.  HENT:     Head: Normocephalic.  Cardiovascular:     Rate and Rhythm: Normal rate and regular rhythm.     Heart sounds: Normal heart sounds.  Pulmonary:     Effort: Pulmonary effort is normal.     Breath sounds: Normal breath sounds.  Skin:     General: Skin is warm.  Neurological:     General: No focal deficit present.     Mental Status: She is alert.  Psychiatric:        Mood and Affect: Mood normal.         Assessment And Plan:  1. Type 2 diabetes mellitus with stage 3 chronic kidney disease, without long-term current use of insulin ([place])  I will check labs as listed below. Importance of regular exercise was discussed with the patient.   - BMP8+EGFR - Hemoglobin A1c  2. Hypertensive heart and renal disease with renal failure, stage 1 through stage 4 or unspecified chronic kidney disease, without heart failure  Fair control. She will continue with current meds. She is encouraged to limit her salt intake.   3. Coronary artery disease involving native coronary artery of native heart without angina pectoris  Chronic. She is also followed by cardiology. Again, importance of regular exercise was again stressed to the patient.   4. Class 1 obesity due to excess calories with serious comorbidity and body mass index (BMI) of 33.0 to 33.9 in adult  She is encouraged to strive for BMI less than 29 to decrease cardiac risk. She is advised to avoid sugary beverages and processed foods.   Maximino Greenland, MD

## 2018-10-21 NOTE — Progress Notes (Signed)
Pt is already aware of lab results. She is unwilling to take insulin for hba1c of 15.2. She will be referred to an endocrinologist for further evaluation.

## 2018-10-26 ENCOUNTER — Encounter (INDEPENDENT_AMBULATORY_CARE_PROVIDER_SITE_OTHER): Payer: Medicare Other | Admitting: Ophthalmology

## 2018-10-26 DIAGNOSIS — E11311 Type 2 diabetes mellitus with unspecified diabetic retinopathy with macular edema: Secondary | ICD-10-CM

## 2018-10-26 DIAGNOSIS — H43813 Vitreous degeneration, bilateral: Secondary | ICD-10-CM | POA: Diagnosis not present

## 2018-10-26 DIAGNOSIS — I10 Essential (primary) hypertension: Secondary | ICD-10-CM

## 2018-10-26 DIAGNOSIS — H4312 Vitreous hemorrhage, left eye: Secondary | ICD-10-CM | POA: Diagnosis not present

## 2018-10-26 DIAGNOSIS — E113513 Type 2 diabetes mellitus with proliferative diabetic retinopathy with macular edema, bilateral: Secondary | ICD-10-CM

## 2018-10-26 DIAGNOSIS — H35033 Hypertensive retinopathy, bilateral: Secondary | ICD-10-CM | POA: Diagnosis not present

## 2018-11-02 ENCOUNTER — Encounter: Payer: Self-pay | Admitting: Nurse Practitioner

## 2018-11-05 ENCOUNTER — Other Ambulatory Visit: Payer: Self-pay | Admitting: Internal Medicine

## 2018-11-08 ENCOUNTER — Ambulatory Visit: Payer: Medicare Other | Admitting: Internal Medicine

## 2018-11-15 DIAGNOSIS — E1122 Type 2 diabetes mellitus with diabetic chronic kidney disease: Secondary | ICD-10-CM | POA: Diagnosis not present

## 2018-11-15 DIAGNOSIS — N183 Chronic kidney disease, stage 3 (moderate): Secondary | ICD-10-CM | POA: Diagnosis not present

## 2018-11-15 DIAGNOSIS — E669 Obesity, unspecified: Secondary | ICD-10-CM | POA: Diagnosis not present

## 2018-11-15 DIAGNOSIS — E1165 Type 2 diabetes mellitus with hyperglycemia: Secondary | ICD-10-CM | POA: Diagnosis not present

## 2018-11-15 DIAGNOSIS — I129 Hypertensive chronic kidney disease with stage 1 through stage 4 chronic kidney disease, or unspecified chronic kidney disease: Secondary | ICD-10-CM | POA: Diagnosis not present

## 2018-11-15 DIAGNOSIS — E8770 Fluid overload, unspecified: Secondary | ICD-10-CM | POA: Diagnosis not present

## 2018-11-22 ENCOUNTER — Ambulatory Visit: Payer: Self-pay

## 2018-11-22 DIAGNOSIS — I1310 Hypertensive heart and chronic kidney disease without heart failure, with stage 1 through stage 4 chronic kidney disease, or unspecified chronic kidney disease: Secondary | ICD-10-CM

## 2018-11-22 DIAGNOSIS — N183 Chronic kidney disease, stage 3 unspecified: Secondary | ICD-10-CM

## 2018-11-22 DIAGNOSIS — E785 Hyperlipidemia, unspecified: Secondary | ICD-10-CM

## 2018-11-22 DIAGNOSIS — E1122 Type 2 diabetes mellitus with diabetic chronic kidney disease: Secondary | ICD-10-CM

## 2018-11-22 NOTE — Patient Instructions (Addendum)
Visit Information  Carolyn Russo was given information about Chronic Care Management services today including:  1. CCM service includes personalized support from designated clinical staff supervised by her physician, including individualized plan of care and coordination with other care providers 2. 24/7 contact phone numbers for assistance for urgent and routine care needs. 3. Service will only be billed when office clinical staff spend 20 minutes or more in a month to coordinate care. 4. Only one practitioner may furnish and bill the service in a calendar month. 5. The patient may stop CCM services at any time (effective at the end of the month) by phone call to the office staff. 6. The patient will be responsible for cost sharing (co-pay) of up to 20% of the service fee (after annual deductible is met).   Patient did not agree to services and wishes to consider information provided before deciding about enrollment in CCM services.   The patient verbalized understanding of instructions provided today and declined a print copy of patient instruction materials.   Telephone follow up appointment with CCM team member scheduled for:  11/27/2018

## 2018-11-22 NOTE — Chronic Care Management (AMB) (Signed)
  Care Management Note   Carolyn Russo is a 68 y.o. year old female who sees [name], MD for primary care. Dr. Baird Cancer asked the CCM team to consult the patient for assistance with chronic disease management related to Diabetes.  Review of patient status, including review of consultants reports, relevant laboratory and other test results, and collaboration with appropriate care team members and the patient's provider was performed as part of comprehensive patient evaluation and provision of chronic care management services.   I reached out to [name] today by telephone to introduce CCM services. [name] volunteered that she has an upcoming appointment tomorrow with the retinal specialist whom she sees monthly and with Dr. Chalmers Cater, endocrinology in 2 weeks. I provided Dr. Almetta Lovely number to her as she wishes to call and confirm her appointment.   [name] was given information about Chronic Care Management services today including:  1. CCM service includes personalized support from designated clinical staff supervised by her physician, including individualized plan of care and coordination with other care providers 2. 24/7 contact phone numbers for assistance for urgent and routine care needs. 3. Service will only be billed when office clinical staff spend 20 minutes or more in a month to coordinate care. 4. Only one practitioner may furnish and bill the service in a calendar month. 5. The patient may stop CCM services at any time (effective at the end of the month) by phone call to the office staff. 6. The patient will be responsible for cost sharing (co-pay) of up to 20% of the service fee (after annual deductible is met).  Patient did not agree to services and wishes to consider information provided before deciding about enrollment in CCM services.     Follow Up Plan: Telephone follow up appointment with CCM team member scheduled for: 11/29/18   [name]  [facility] Nurse Care Coordinator [facility] [phone]

## 2018-11-23 ENCOUNTER — Encounter (INDEPENDENT_AMBULATORY_CARE_PROVIDER_SITE_OTHER): Payer: Medicare Other | Admitting: Ophthalmology

## 2018-11-23 DIAGNOSIS — H43813 Vitreous degeneration, bilateral: Secondary | ICD-10-CM

## 2018-11-23 DIAGNOSIS — H4312 Vitreous hemorrhage, left eye: Secondary | ICD-10-CM | POA: Diagnosis not present

## 2018-11-23 DIAGNOSIS — E113513 Type 2 diabetes mellitus with proliferative diabetic retinopathy with macular edema, bilateral: Secondary | ICD-10-CM

## 2018-11-23 DIAGNOSIS — E11311 Type 2 diabetes mellitus with unspecified diabetic retinopathy with macular edema: Secondary | ICD-10-CM | POA: Diagnosis not present

## 2018-11-23 DIAGNOSIS — I10 Essential (primary) hypertension: Secondary | ICD-10-CM

## 2018-11-23 DIAGNOSIS — H35033 Hypertensive retinopathy, bilateral: Secondary | ICD-10-CM

## 2018-11-23 LAB — HM DIABETES EYE EXAM

## 2018-12-04 ENCOUNTER — Ambulatory Visit: Payer: Self-pay

## 2018-12-04 ENCOUNTER — Ambulatory Visit (INDEPENDENT_AMBULATORY_CARE_PROVIDER_SITE_OTHER): Payer: Medicare Other | Admitting: Podiatry

## 2018-12-04 ENCOUNTER — Encounter: Payer: Self-pay | Admitting: Podiatry

## 2018-12-04 ENCOUNTER — Encounter: Payer: Self-pay | Admitting: Internal Medicine

## 2018-12-04 DIAGNOSIS — E1159 Type 2 diabetes mellitus with other circulatory complications: Secondary | ICD-10-CM

## 2018-12-04 DIAGNOSIS — M79674 Pain in right toe(s): Secondary | ICD-10-CM | POA: Diagnosis not present

## 2018-12-04 DIAGNOSIS — N183 Chronic kidney disease, stage 3 unspecified: Secondary | ICD-10-CM

## 2018-12-04 DIAGNOSIS — E119 Type 2 diabetes mellitus without complications: Secondary | ICD-10-CM | POA: Diagnosis not present

## 2018-12-04 DIAGNOSIS — I1310 Hypertensive heart and chronic kidney disease without heart failure, with stage 1 through stage 4 chronic kidney disease, or unspecified chronic kidney disease: Secondary | ICD-10-CM

## 2018-12-04 DIAGNOSIS — I2510 Atherosclerotic heart disease of native coronary artery without angina pectoris: Secondary | ICD-10-CM

## 2018-12-04 DIAGNOSIS — B351 Tinea unguium: Secondary | ICD-10-CM | POA: Diagnosis not present

## 2018-12-04 DIAGNOSIS — E1122 Type 2 diabetes mellitus with diabetic chronic kidney disease: Secondary | ICD-10-CM

## 2018-12-04 DIAGNOSIS — M79675 Pain in left toe(s): Secondary | ICD-10-CM

## 2018-12-04 DIAGNOSIS — E785 Hyperlipidemia, unspecified: Secondary | ICD-10-CM

## 2018-12-04 NOTE — Progress Notes (Signed)
Complaint:  Visit Type: Patient returns to my office for continued preventative foot care services. Complaint: Patient states" my nails have grown long and thick and become painful to walk and wear shoes" Patient has been diagnosed with DM with no foot complications. The patient presents for preventative foot care services. No changes to ROS  Podiatric Exam: Vascular: dorsalis pedis and posterior tibial pulses are palpable bilateral. Capillary return is immediate. Temperature gradient is WNL. Skin turgor WNL  Sensorium: Normal Semmes Weinstein monofilament test. Normal tactile sensation bilaterally. Nail Exam: Pt has thick disfigured discolored nails with subungual debris noted bilateral entire nail hallux through fifth toenails Ulcer Exam: There is no evidence of ulcer or pre-ulcerative changes or infection. Orthopedic Exam: Muscle tone and strength are WNL. No limitations in general ROM. No crepitus or effusions noted. HAV  B/L.  Hammer toes  2  B/L. Skin: No Porokeratosis. No infection or ulcers  Diagnosis:  Onychomycosis, , Pain in right toe, pain in left toes  Treatment & Plan Procedures and Treatment: Consent by patient was obtained for treatment procedures.   Debridement of mycotic and hypertrophic toenails, 1 through 5 bilateral and clearing of subungual debris. No ulceration, no infection noted.  Return Visit-Office Procedure: Patient instructed to return to the office for a follow up visit 3 months for continued evaluation and treatment.    Gardiner Barefoot DPM

## 2018-12-04 NOTE — Patient Instructions (Addendum)
Visit Information  Goals    . "I would appreciate getting help with rescheduling my doctors appointment with Dr. Chalmers Cater. My husband will have surgery on the day I am scheduled to be seen" (pt-stated)     Current Barriers:  Marland Kitchen Knowledge deficit related to Diabetes Mellitus disease process and Self Health Management as evidence by Hgb A1c of 15.2 . Spouse is scheduled for surgery on the day of new patient appointment with Endocrinology  Nurse Case Manager Clinical Goal(s): Over the next 30 days, patient will have completed her new patient appointment with Dr. Chalmers Cater, Endocrinologist for evaluation of uncontrolled Diabetes.   Interventions:   Telephone outreach to patient to follow up on CCM interest   Assessed for patient's adherence to attending her new patient f/u with Dr. Chalmers Cater Endocrinologist (pt reported her appt is on 12/18/18 but she has conflict)  Telephone outreach to the office of Endocrinologist, Dr. Jacelyn Pi [phone], spoke to Freda Munro, requested patient's appointment be rescheduled to be seen ASAP for Hgb A1c of 15.2. (Advised pt has conflict d/t spouse is scheduled for surgery on the current appt date)  Provided clinic RN CM direct phone number for a call back with approved new patient appointment date  Confirmed with patient RN CM will contact her ASAP with the new appointment date/time  Face to Face appointment has been scheduled with patient in the office following her appointment with Dr. [name], MD on 01/01/19, 3 PM  Patient Self Care Activities:  [name] understanding RN CM will call her with reschedule Endo appt . Verbalizes understanding of CCM Face to Face encounter immediately following PCP f/u with Dr. Baird Cancer set for 01/01/19 at 3 PM . Adherence to keeping all scheduled MD appointments  *initial goal documentation   Patient verbalizes understanding of today's telephone outreach and declines receiving a copy of the AVS.   Face to Face appointment  with CCM team member scheduled for: 01/01/19 3 PM (immediately following PCP visit with Dr. Baird Cancer)  Barb Merino, RN,CCM Care Management Coordinator [place] [facility]  Direct Phone: [phone]

## 2018-12-04 NOTE — Chronic Care Management (AMB) (Signed)
Care Management Note   Carolyn Russo is a 68 y.o. year old female who sees [name], MD for primary care. Dr. Baird Cancer asked the CCM team to consult the patient for assistance with chronic disease management related to Diabetes with a noted Hgb A1c of 15.2.   Second telephone outreach to patient today to reintroduce CCM services and assess for patient interest in enrollment. Initially the patient stated she would consider services following her scheduled new patient appointment with Dr. Chalmers Cater, Endocrinologist which has come to pass. Today she is agreeable to a face to face introduction to discuss her Diabetes self care management, following her PCP appt with Dr. Baird Cancer, set for 01/01/19 at 3 PM.   I reached out to Lyndee Hensen by phone today.   Goals    . "I would appreciate getting help with rescheduling my doctors appointment with Dr. Chalmers Cater. My husband will have surgery on the day I am scheduled to be seen" (pt-stated)     Current Barriers:  Marland Kitchen Knowledge deficit related to Diabetes Mellitus disease process and Self Health Management as evidence by Hgb A1c of 15.2 . Spouse is scheduled for surgery on the day of new patient appointment with Endocrinology  Nurse Case Manager Clinical Goal(s): Over the next 30 days, patient will have completed her new patient appointment with Dr. Chalmers Cater, Endocrinologist for evaluation of uncontrolled Diabetes.   Interventions:   Telephone outreach to patient to follow up on CCM interest   Assessed for patient's adherence to attending her new patient f/u with Dr. Chalmers Cater Endocrinologist (pt reported her appt is on 12/18/18 but she has conflict)  Telephone outreach to the office of Endocrinologist, Dr. Jacelyn Pi [phone], spoke to Freda Munro, requested patient's appointment be rescheduled to be seen ASAP for Hgb A1c of 15.2. (Advised pt has conflict d/t spouse is scheduled for surgery on the current appt date)  Provided clinic RN CM direct phone  number for a call back with approved new patient appointment date  Confirmed with patient RN CM will contact her ASAP with the new appointment date/time  Face to Face appointment has been scheduled with patient in the office following her appointment with Dr. [name], MD on 01/01/19, 3 PM  Patient Self Care Activities:  [name] understanding RN CM will call her with reschedule Endo appt . Verbalizes understanding of CCM Face to Face encounter immediately following PCP f/u with Dr. Baird Cancer set for 01/01/19 at 3 PM . Adherence to keeping all scheduled MD appointments  *initial goal documentation   Ms. Milos was given information about Chronic Care Management services today including:  1. CCM service includes personalized support from designated clinical staff supervised by her physician, including individualized plan of care and coordination with other care providers 2. 24/7 contact phone numbers for assistance for urgent and routine care needs. 3. Service will only be billed when office clinical staff spend 20 minutes or more in a month to coordinate care. 4. Only one practitioner may furnish and bill the service in a calendar month. 5. The patient may stop CCM services at any time (effective at the end of the month) by phone call to the office staff. 6. The patient will be responsible for cost sharing (co-pay) of up to 20% of the service fee (after annual deductible is met).  Patient agreed to services and verbal consent obtained.    Follow Up Plan: Face to Face appointment with CCM team member scheduled for: 01/01/19 3 PM (immediately following  her PCP visit with Dr. Baird Cancer)  Barb Merino, RN,CCM Care Management Coordinator [place] [facility]  Direct Phone: [phone]

## 2018-12-05 ENCOUNTER — Ambulatory Visit (INDEPENDENT_AMBULATORY_CARE_PROVIDER_SITE_OTHER): Payer: Medicare Other

## 2018-12-05 ENCOUNTER — Telehealth: Payer: Self-pay

## 2018-12-05 DIAGNOSIS — I1310 Hypertensive heart and chronic kidney disease without heart failure, with stage 1 through stage 4 chronic kidney disease, or unspecified chronic kidney disease: Secondary | ICD-10-CM

## 2018-12-05 DIAGNOSIS — E785 Hyperlipidemia, unspecified: Secondary | ICD-10-CM | POA: Diagnosis not present

## 2018-12-05 DIAGNOSIS — E1159 Type 2 diabetes mellitus with other circulatory complications: Secondary | ICD-10-CM | POA: Diagnosis not present

## 2018-12-05 DIAGNOSIS — E1122 Type 2 diabetes mellitus with diabetic chronic kidney disease: Secondary | ICD-10-CM | POA: Diagnosis not present

## 2018-12-05 DIAGNOSIS — N183 Chronic kidney disease, stage 3 unspecified: Secondary | ICD-10-CM

## 2018-12-05 DIAGNOSIS — I2510 Atherosclerotic heart disease of native coronary artery without angina pectoris: Secondary | ICD-10-CM | POA: Diagnosis not present

## 2018-12-05 NOTE — Telephone Encounter (Signed)
The pt was notified that I would let medical records know that she needed all her medical records sent to Dr. Octavio Manns office and that it takes 7 - 10 business days.

## 2018-12-05 NOTE — Chronic Care Management (AMB) (Signed)
  Chronic Care Management   Follow Up Note   12/05/2018 Name: Carolyn Russo MRN: [id number] DOB: 06/02/1951  Referred by: [name], MD Reason for referral : Care Coordination (CC Telephone follow-up)   Subjective: "I am very appreciative of you getting my appointment rescheduled"  Objective: Lab Results  Component Value Date   HGBA1C 15.2 (H) 10/10/2018   HGBA1C 12.8 (H) 08/16/2018   HGBA1C 10.3 (H) 10/13/2011   Lab Results  Component Value Date   MICROALBUR 1.41 10/13/2011   LDLCALC 64 05/08/2017   CREATININE 1.83 (H) 10/10/2018   BP Readings from Last 3 Encounters:  10/10/18 (!) 146/82  08/16/18 126/72  04/13/18 (!) 144/74   Assessment: Carolyn F Whitakeris a 68 y.o.year old femalewho sees [name], MDfor primary care. Dr. Caffie Pinto the CCM team to consult the patient for assistance with chronic disease management related to Diabetes with a noted Hgb A1c of 15.2.   I spoke with Carolyn Russo today regarding her new patient appointment with Dr. Jacelyn Pi, Endocrinologist.   Goals Addressed    . "I would appreciate getting help with rescheduling my doctors appointment with Dr. Chalmers Cater. My husband will have surgery on the day I am scheduled to be seen" (pt-stated)       Current Barriers:  Marland Kitchen Knowledge deficit related to Diabetes Mellitus disease process and Self Health Management as evidence by Hgb A1c of 15.2 . Spouse is scheduled for surgery on the day of new patient appointment with Endocrinology  Nurse Case Manager Clinical Goal(s): Over the next 30 days, patient will have completed her new patient appointment with Dr. Chalmers Cater, Endocrinologist for evaluation of uncontrolled Diabetes.   Interventions:  Telephone outreach to office for Dr. Chalmers Cater, Endocrinologist [phone] (spoke with Lorenso Courier), new patient appointment confirmed for tomorrow, 12/06/18 at 10:30 AM with Dr. Chalmers Cater.  Telephone outreach to patient to advise of new patient appointment with  Dr. Chalmers Cater (pt verbalizes understanding and confirms she will keep the appointment) Collaboration with Michelle Nasuti, CMA re: pt's request to send medical records to Dr. Chalmers Cater Confirmed with patient she will have Face to Face visit with CCM team following her appointment with Dr. [name], MD on 01/01/19, 3 PM (pt agree's)  Patient Self Care Activities:  [name] understanding of new patient appt w/Endocrinologist scheduled for tomorrow . Verbalizes understanding of CCM Face to Face encounter immediately following PCP f/u with Dr. Baird Cancer set for 01/01/19 at 3 PM . Adherence to keeping all scheduled MD appointments  Please see past updates related to this goal by clicking on the "Past Updates" button in the selected goal       Face to Face appointment with CCM team member scheduled for:   01/01/19 @3  PM   Barb Merino, RN,CCM Care Management Coordinator [place] [facility]  Direct Phone: [phone]

## 2018-12-05 NOTE — Patient Instructions (Signed)
Visit Information  Goals    . "I would appreciate getting help with rescheduling my doctors appointment with Dr. Chalmers Cater. My husband will have surgery on the day I am scheduled to be seen" (pt-stated)     Current Barriers:  Marland Kitchen Knowledge deficit related to Diabetes Mellitus disease process and Self Health Management as evidence by Hgb A1c of 15.2 . Spouse is scheduled for surgery on the day of new patient appointment with Endocrinology  Nurse Case Manager Clinical Goal(s): Over the next 30 days, patient will have completed her new patient appointment with Dr. Chalmers Cater, Endocrinologist for evaluation of uncontrolled Diabetes.   Interventions:  Telephone outreach to office for Dr. Chalmers Cater, Endocrinologist [phone] (spoke with Lorenso Courier), new patient appointment confirmed for tomorrow, 12/06/18 at 10:30 AM with Dr. Chalmers Cater.  Telephone outreach to patient to advise of new patient appointment with Dr. Chalmers Cater (pt verbalizes understanding and confirms she will keep the appointment) Collaboration with Michelle Nasuti, CMA re: pt's request to send medical records to Dr. Chalmers Cater Confirmed with patient she will have Face to Face visit with CCM team following her appointment with Dr. [name], MD on 01/01/19, 3 PM (pt agree's)  Patient Self Care Activities:  [name] understanding of new patient appt w/Endocrinologist scheduled for tomorrow . Verbalizes understanding of CCM Face to Face encounter immediately following PCP f/u with Dr. Baird Cancer set for 01/01/19 at 3 PM . Adherence to keeping all scheduled MD appointments  Please see past updates related to this goal by clicking on the "Past Updates" button in the selected goal      The patient verbalized understanding of instructions provided today and declined a print copy of patient instruction materials.   Face to Face appointment with CCM team member scheduled for: 01/01/19 @3  PM   Barb Merino, RN,CCM Care Management Coordinator [place]  [facility]  Direct Phone: [phone]

## 2018-12-06 DIAGNOSIS — N189 Chronic kidney disease, unspecified: Secondary | ICD-10-CM | POA: Diagnosis not present

## 2018-12-06 DIAGNOSIS — E7800 Pure hypercholesterolemia, unspecified: Secondary | ICD-10-CM | POA: Diagnosis not present

## 2018-12-06 DIAGNOSIS — I10 Essential (primary) hypertension: Secondary | ICD-10-CM | POA: Diagnosis not present

## 2018-12-06 DIAGNOSIS — E1165 Type 2 diabetes mellitus with hyperglycemia: Secondary | ICD-10-CM | POA: Diagnosis not present

## 2018-12-13 ENCOUNTER — Ambulatory Visit: Payer: Medicare Other | Admitting: Registered"

## 2018-12-21 ENCOUNTER — Encounter (INDEPENDENT_AMBULATORY_CARE_PROVIDER_SITE_OTHER): Payer: Medicare Other | Admitting: Ophthalmology

## 2018-12-21 DIAGNOSIS — I10 Essential (primary) hypertension: Secondary | ICD-10-CM

## 2018-12-21 DIAGNOSIS — E11311 Type 2 diabetes mellitus with unspecified diabetic retinopathy with macular edema: Secondary | ICD-10-CM | POA: Diagnosis not present

## 2018-12-21 DIAGNOSIS — E113513 Type 2 diabetes mellitus with proliferative diabetic retinopathy with macular edema, bilateral: Secondary | ICD-10-CM | POA: Diagnosis not present

## 2018-12-21 DIAGNOSIS — H43813 Vitreous degeneration, bilateral: Secondary | ICD-10-CM | POA: Diagnosis not present

## 2018-12-21 DIAGNOSIS — H35033 Hypertensive retinopathy, bilateral: Secondary | ICD-10-CM | POA: Diagnosis not present

## 2018-12-21 DIAGNOSIS — H4312 Vitreous hemorrhage, left eye: Secondary | ICD-10-CM | POA: Diagnosis not present

## 2018-12-31 ENCOUNTER — Ambulatory Visit: Payer: Medicare Other | Admitting: Internal Medicine

## 2019-01-01 ENCOUNTER — Ambulatory Visit: Payer: Self-pay

## 2019-01-01 ENCOUNTER — Ambulatory Visit (INDEPENDENT_AMBULATORY_CARE_PROVIDER_SITE_OTHER): Payer: Medicare Other

## 2019-01-01 ENCOUNTER — Ambulatory Visit (INDEPENDENT_AMBULATORY_CARE_PROVIDER_SITE_OTHER): Payer: Medicare Other | Admitting: Internal Medicine

## 2019-01-01 ENCOUNTER — Encounter: Payer: Self-pay | Admitting: Internal Medicine

## 2019-01-01 VITALS — BP 124/76 | HR 75 | Temp 97.4°F | Ht 60.8 in | Wt 175.4 lb

## 2019-01-01 DIAGNOSIS — I1310 Hypertensive heart and chronic kidney disease without heart failure, with stage 1 through stage 4 chronic kidney disease, or unspecified chronic kidney disease: Secondary | ICD-10-CM

## 2019-01-01 DIAGNOSIS — E1122 Type 2 diabetes mellitus with diabetic chronic kidney disease: Secondary | ICD-10-CM | POA: Diagnosis not present

## 2019-01-01 DIAGNOSIS — N183 Chronic kidney disease, stage 3 unspecified: Secondary | ICD-10-CM

## 2019-01-01 DIAGNOSIS — E6609 Other obesity due to excess calories: Secondary | ICD-10-CM

## 2019-01-01 DIAGNOSIS — Z6833 Body mass index (BMI) 33.0-33.9, adult: Secondary | ICD-10-CM | POA: Diagnosis not present

## 2019-01-01 NOTE — Patient Instructions (Signed)
Diabetes Mellitus and Foot Care  Foot care is an important part of your health, especially when you have diabetes. Diabetes may cause you to have problems because of poor blood flow (circulation) to your feet and legs, which can cause your skin to:   Become thinner and drier.   Break more easily.   Heal more slowly.   Peel and crack.  You may also have nerve damage (neuropathy) in your legs and feet, causing decreased feeling in them. This means that you may not notice minor injuries to your feet that could lead to more serious problems. Noticing and addressing any potential problems early is the best way to prevent future foot problems.  How to care for your feet  Foot hygiene   Wash your feet daily with warm water and mild soap. Do not use hot water. Then, pat your feet and the areas between your toes until they are completely dry. Do not soak your feet as this can dry your skin.   Trim your toenails straight across. Do not dig under them or around the cuticle. File the edges of your nails with an emery board or nail file.   Apply a moisturizing lotion or petroleum jelly to the skin on your feet and to dry, brittle toenails. Use lotion that does not contain alcohol and is unscented. Do not apply lotion between your toes.  Shoes and socks   Wear clean socks or stockings every day. Make sure they are not too tight. Do not wear knee-high stockings since they may decrease blood flow to your legs.   Wear shoes that fit properly and have enough cushioning. Always look in your shoes before you put them on to be sure there are no objects inside.   To break in new shoes, wear them for just a few hours a day. This prevents injuries on your feet.  Wounds, scrapes, corns, and calluses   Check your feet daily for blisters, cuts, bruises, sores, and redness. If you cannot see the bottom of your feet, use a mirror or ask someone for help.   Do not cut corns or calluses or try to remove them with medicine.   If you  find a minor scrape, cut, or break in the skin on your feet, keep it and the skin around it clean and dry. You may clean these areas with mild soap and water. Do not clean the area with peroxide, alcohol, or iodine.   If you have a wound, scrape, corn, or callus on your foot, look at it several times a day to make sure it is healing and not infected. Check for:  ? Redness, swelling, or pain.  ? Fluid or blood.  ? Warmth.  ? Pus or a bad smell.  General instructions   Do not cross your legs. This may decrease blood flow to your feet.   Do not use heating pads or hot water bottles on your feet. They may burn your skin. If you have lost feeling in your feet or legs, you may not know this is happening until it is too late.   Protect your feet from hot and cold by wearing shoes, such as at the beach or on hot pavement.   Schedule a complete foot exam at least once a year (annually) or more often if you have foot problems. If you have foot problems, report any cuts, sores, or bruises to your health care provider immediately.  Contact a health care provider if:     You have a medical condition that increases your risk of infection and you have any cuts, sores, or bruises on your feet.   You have an injury that is not healing.   You have redness on your legs or feet.   You feel burning or tingling in your legs or feet.   You have pain or cramps in your legs and feet.   Your legs or feet are numb.   Your feet always feel cold.   You have pain around a toenail.  Get help right away if:   You have a wound, scrape, corn, or callus on your foot and:  ? You have pain, swelling, or redness that gets worse.  ? You have fluid or blood coming from the wound, scrape, corn, or callus.  ? Your wound, scrape, corn, or callus feels warm to the touch.  ? You have pus or a bad smell coming from the wound, scrape, corn, or callus.  ? You have a fever.  ? You have a red line going up your leg.  Summary   Check your feet every day  for cuts, sores, red spots, swelling, and blisters.   Moisturize feet and legs daily.   Wear shoes that fit properly and have enough cushioning.   If you have foot problems, report any cuts, sores, or bruises to your health care provider immediately.   Schedule a complete foot exam at least once a year (annually) or more often if you have foot problems.  This information is not intended to replace advice given to you by your health care provider. Make sure you discuss any questions you have with your health care provider.  Document Released: 10/07/2000 Document Revised: 11/22/2017 Document Reviewed: 11/11/2016  Elsevier Interactive Patient Education  2019 [employer]vier Inc[employer]

## 2019-01-01 NOTE — Progress Notes (Signed)
Subjective:     Patient ID: Carolyn Russo , female    DOB: [birth year] , 68 y.o.   MRN: [id number]   Chief Complaint  Patient presents with  . Diabetes  . Hypertension    HPI  She is here today for f/u diabetes.  At her last visit, she refused to start insulin despite a1c above 12%. She was then referred to Dr. Chalmers Cater. Unfortunately, she took her off of Trulicity once weekly and started her on insulin. She is unable to state the name of the insulin. She reports improved blood sugars. BS today was 100.   Hypertension  This is a chronic problem. The current episode started more than 1 year ago. The problem has been gradually improving since onset. The problem is controlled. Pertinent negatives include no blurred vision, palpitations or shortness of breath. Risk factors for coronary artery disease include diabetes mellitus, dyslipidemia, obesity, post-menopausal state and sedentary lifestyle.   Reports compliance with meds.   Past Medical History:  Diagnosis Date  . Allergy   . Anterior chest wall pain   . Appendicitis 1965  . Asthma   . Body mass index 37.0-37.9, adult   . Breast pain   . Cataract    both eyes  . Chronic renal disease, stage II   . Dehydration 2014  . Deviated septum 1971  . Diabetes mellitus   . Dyspnea 2014  . Extrinsic asthma    WITH ASTHMA ATTACK  . Fibroid 1980  . GERD (gastroesophageal reflux disease)   . Heart murmur   . Hx gestational diabetes   . Hyperlipidemia   . Hypertension 2014  . Inguinal hernia 1959  . Malaise and fatigue 2014  . Nephropathy   . Non-IgE mediated allergic asthma 2014  . Obesity   . Pelvic pain   . Pregnancy, high-risk 1985  . Tonsillitis 1968  . Uterine fibroid 1980     Family History  Problem Relation Age of Onset  . Cancer Mother        abdominal melamona  . Psoriasis Mother   . Alzheimer's disease Father   . Cancer Cousin        colon   . Diabetes Cousin   . Diabetes Maternal Aunt   . Colon cancer Neg Hx    . Colon polyps Neg Hx   . Esophageal cancer Neg Hx   . Rectal cancer Neg Hx   . Stomach cancer Neg Hx   . Breast cancer Neg Hx      Current Outpatient Medications:  .  albuterol (PROVENTIL HFA;VENTOLIN HFA) 108 (90 BASE) MCG/ACT inhaler, Inhale 2 puffs into the lungs every 4 (four) hours as needed for wheezing or shortness of breath., Disp: 1 Inhaler, Rfl: 3 .  aspirin 81 MG tablet, Take 81 mg by mouth daily., Disp: , Rfl:  .  b complex vitamins capsule, Take 1 capsule by mouth daily., Disp: , Rfl:  .  BESIVANCE 0.6 % SUSP, INSTILL ONE GTT IN RIGHT EYE QID FOR 2 DAYS AFTER EACH MONTHLY EYE INJECTION, Disp: , Rfl:  .  bisacodyl (BISACODYL) 5 MG EC tablet, Take 5 mg by mouth daily as needed for moderate constipation., Disp: , Rfl:  .  Blood Glucose Monitoring Suppl (ONETOUCH VERIO) w/Device KIT, USE UTD, Disp: , Rfl:  .  Cholecalciferol (VITAMIN D3 PO), Take 2,000 Int'l Units by mouth daily., Disp: , Rfl:  .  [employer] CINNAMON PO, Take 250 mg by mouth 2 (two) times daily. , Disp: ,  Rfl:  .  fluticasone (FLONASE) 50 MCG/ACT nasal spray, Place 2 sprays into the nose daily as needed for allergies. , Disp: , Rfl:  .  glucose blood (ACCU-CHEK GUIDE) test strip, Use as instructed to check blood sugars 2 times per day dx: e11.65, Disp: 100 each, Rfl: 11 .  lactobacillus acidophilus (BACID) TABS, Take 1 tablet by mouth daily. PROBIOTIC, Disp: , Rfl:  .  magnesium oxide (MAG-OX) 400 (241.3 Mg) MG tablet, Take 1 tablet by mouth daily., Disp: , Rfl:  .  milk thistle 175 MG tablet, Take 525 mg by mouth daily. Takes 3 daily, Disp: , Rfl:  .  ONETOUCH VERIO test strip, CHECK BS ONCE DAILY UTD, Disp: , Rfl: 11 .  Polyvinyl Alcohol-Povidone (REFRESH OP), Apply to eye., Disp: , Rfl:  .  rosuvastatin (CRESTOR) 5 MG tablet, Take 1 tablet (5 mg total) by mouth daily., Disp: 90 tablet, Rfl: 3 .  valsartan-hydrochlorothiazide (DIOVAN-HCT) 320-12.5 MG tablet, Take 1 tablet by mouth daily. Please make overdue appt with  Dr. Tamala Julian or refill with PCP. 1st attempt, Disp: 30 tablet, Rfl: 0 .  budesonide-formoterol (SYMBICORT) 160-4.5 MCG/ACT inhaler, Inhale 2 puffs into the lungs 2 (two) times daily., Disp: , Rfl:  .  triamcinolone cream (KENALOG) 0.1 %, triamcinolone acetonide 0.1 % topical cream, Disp: , Rfl:  .  valACYclovir (VALTREX) 1000 MG tablet, valacyclovir 1 gram tablet, Disp: , Rfl:    Allergies  Allergen Reactions  . Food Anaphylaxis    Peanuts; Almonds  . Other Shortness Of Breath    Peanuts; Almonds  . Wheat Bran Shortness Of Breath  . Statins Itching and Other (See Comments)    Generalized aches  . Iodine Other (See Comments)  . Shellfish Allergy Other (See Comments)    Mouth gets raw  . Tetracycline Other (See Comments)    Raw mouth  . Tetracyclines & Related Itching  . Contrast Media [Iodinated Diagnostic Agents] Rash    Happened during CT scan over 30 years ago     Review of Systems  Constitutional: Negative.   Eyes: Negative for blurred vision.  Respiratory: Negative.  Negative for shortness of breath.   Cardiovascular: Negative.  Negative for palpitations.  Gastrointestinal: Negative.   Neurological: Negative.   Psychiatric/Behavioral: Negative.      Today's Vitals   01/01/19 1513  BP: 124/76  Pulse: 75  Temp: (!) 97.4 F (36.3 C)  TempSrc: Oral  Weight: 175 lb 6.4 oz (79.6 kg)  Height: 5' 0.8" (1.544 m)  PainSc: 0-No pain   Body mass index is 33.36 kg/m.   Objective:  Physical Exam Vitals signs and nursing note reviewed.  Constitutional:      Appearance: Normal appearance.  HENT:     Head: Normocephalic and atraumatic.  Cardiovascular:     Rate and Rhythm: Normal rate and regular rhythm.     Heart sounds: Normal heart sounds.  Pulmonary:     Effort: Pulmonary effort is normal.     Breath sounds: Normal breath sounds.  Skin:    General: Skin is warm.  Neurological:     General: No focal deficit present.     Mental Status: She is alert.  Psychiatric:         Mood and Affect: Mood normal.        Behavior: Behavior normal.         Assessment And Plan:     1. Type 2 diabetes mellitus with stage 3 chronic kidney disease, without long-term current  use of insulin ([place])  I am happy to see she has started insulin. Unfortunately, she does not know the name of the insulin. I will request her records from Dr. Chalmers Cater. She is scheduled to meet with CCM nurse today as well for chronic disease management. She was congratulated on her decision to get her sugars under better control. She is encouraged to keep up the great work.   2. Hypertensive heart and renal disease with renal failure, stage 1 through stage 4 or unspecified chronic kidney disease, without heart failure  Well controlled. She will continue with current meds. She is encouraged to avoid adding salt to her foods.   3. Class 1 obesity due to excess calories with serious comorbidity and body mass index (BMI) of 33.0 to 33.9 in adult  Importance of achieving optimal weight to decrease risk of cardiovascular disease and cancers was discussed with the patient in full detail. She is encouraged to start slowly - start with 10 minutes twice daily at least three to four days per week and to gradually build to 30 minutes five days weekly. She was given tips to incorporate more activity into her daily routine - take stairs when possible, park farther away from her job, grocery stores, etc.    Maximino Greenland, MD

## 2019-01-02 ENCOUNTER — Ambulatory Visit: Payer: Self-pay

## 2019-01-02 DIAGNOSIS — N183 Chronic kidney disease, stage 3 unspecified: Secondary | ICD-10-CM

## 2019-01-02 DIAGNOSIS — I1310 Hypertensive heart and chronic kidney disease without heart failure, with stage 1 through stage 4 chronic kidney disease, or unspecified chronic kidney disease: Secondary | ICD-10-CM

## 2019-01-02 DIAGNOSIS — E785 Hyperlipidemia, unspecified: Secondary | ICD-10-CM

## 2019-01-02 DIAGNOSIS — E1122 Type 2 diabetes mellitus with diabetic chronic kidney disease: Secondary | ICD-10-CM

## 2019-01-02 NOTE — Chronic Care Management (AMB) (Signed)
  Care Management Note   Carolyn Russo is a 68 y.o. year old female who is a primary care patient of [name], MD CCM RN [place] requested SW consult to assist with patients self-reported resource needs which include assistance with light-housekeeping and respite care.   Review of patient status, including review of consultants reports, and collaboration with appropriate care team members and the patient's provider was performed as part of comprehensive patient evaluation and provision of chronic care management services. Telephone outreach to patient today to introduce CCM services.   Unfortunately, today's outreach was unsuccessful. Patients voice mailbox is full which prohibited SW from leaving a HIPAA compliant voice message.   Follow Up Plan: SW will follow up with patient by phone over the next week.   Daneen Schick, BSW, CDP [facility] Care Management Social Worker [phone]  Total time spent performing care coordination and/or care management activities with the patient by phone or face to face = 5 minutes.

## 2019-01-02 NOTE — Chronic Care Management (AMB) (Signed)
Chronic Care Management   Initial Visit Note  01/02/2019 Name: Carolyn Russo MRN: [id number] DOB: [birth year]  Referred by: [name], MD Reason for referral : Chronic Care Management (INITIAL FACE TO FACE VISIT)   Subjective: "I am checking my blood sugars every morning"  Objective: Lab Results  Component Value Date   HGBA1C 15.2 (H) 10/10/2018   HGBA1C 12.8 (H) 08/16/2018   HGBA1C 10.3 (H) 10/13/2011   Lab Results  Component Value Date   MICROALBUR 1.41 10/13/2011   LDLCALC 64 05/08/2017   CREATININE 1.83 (H) 10/10/2018   BP Readings from Last 3 Encounters:  01/01/19 124/76  10/10/18 (!) 146/82  08/16/18 126/72    Carolyn Russo is a 68 y.o. year old female who is a primary care patient of [name], MD. The CCM team was consulted for assistance with chronic disease management and care coordination needs.   Review of patient status, including review of consultants reports, relevant laboratory and other test results, and collaboration with appropriate care team members and the patient's provider was performed as part of comprehensive patient evaluation and provision of chronic care management services.    I met with Carolyn Russo in the office today for her initial CCM Face to Face visit.   Goals Addressed    Patient Stated   . "I am checking my blood sugars every morning" (pt-stated)       Current Barriers:  Marland Kitchen Knowledge Deficits related to Diabetes . Non-adherence to prescribed medication regimen (patient refused insulin until recently)  Nurse Case Manager Clinical Goal(s):  Marland Kitchen Over the next 30 days, patient will verbalize understanding of plan for ongoing treatment for Diabetes management  Interventions:  . Face to Face CCM visit completed with patient . Review of most recent laboratory data related to DM . Education provided about s/s of hypo & hyperglycemia and how to manage at home, including when to call HCP if needed . Discussion about emergency  measures related to DM  Verbal and written education provided to patient related to diabetic meal planning, using the plate method, discussed portion sizes, carb counting and knowing how many carbs to each with each meal/snack  Provided "Living Well with Diabetes" booklet  Provided [facility] blue notebook calendar with blood glucose monitoring log  Provided written instructions for Diabetic education and support class offered by [facility] (patient will consider attending)  Assessed knowledge/understanding and comfort level with self-monitoring CBG's  Instructed on best time to monitor glucose levels, preprandial and or if feeling symptomatic  Assessed for knowledge and understanding of importance of daily foot inspection (pt inspects every night; has foot specialist)  Assessed for adherence to annual eye exam (pt has Ophthalmology f/u scheduled) . Provided patient with RNCM contact #; provided [facility] 24/7 nurse advice line phone # . Scheduled a CCM telephone follow up call with patient   Patient Self Care Activities:   Verbalizes understanding of the education/information provided during visit . Self administers medications as prescribed (recently agreed to start insulin) . Attends all scheduled provider appointments (reschedules if unable to keep appointment) . Calls pharmacy for medication refills . Attends church or other social activities . Performs ADL's independently . Performs IADL's independently . Calls provider office for new concerns or questions  Plan:  . Patient will continue to monitor her fasting CBG's daily and record her readings  . Patient will call RNCM and or Dr. Almetta Lovely office to report abnormal CBG's . RNCM will follow up with patient by telephone  over the next 1-2 weeks   Initial goal documentation    . COMPLETED: "I would appreciate getting help with rescheduling my doctors appointment with Dr. Chalmers Cater. My husband will have surgery on the day I am scheduled to be  seen" (pt-stated)       Current Barriers:  Marland Kitchen Knowledge deficit related to Diabetes Mellitus disease process and Self Health Management as evidence by Hgb A1c of 15.2 . Spouse is scheduled for surgery on the day of new patient appointment with Endocrinology  Nurse Case Manager Clinical Goal(s): Over the next 30 days, patient will have completed her new patient appointment with Dr. Chalmers Cater, Endocrinologist for evaluation of uncontrolled Diabetes.   Interventions:   Face to Face visit completed with patient in the office for her initial visit  Assessed for knowledge of Diabetes treatment plan as recommended by Endocrinologist  Assessed for adherence to following prescribed treatment plan, including taking her insulin exactly as prescribed  Assessed for knowledge and comfort level for self monitoring CBG's as directed  Assessed for most recent CBG's taken; (pt reports, today fasting CBG=100, yesterday am fasting CBG=137, 12/30/18 fasting CBG=191)  Assessed for knowledge of and planned adherence for Endo f/u appt   Patient Self Care Activities:  Carolyn Russo understanding of new patient appt w/Endocrinologist scheduled for tomorrow . Verbalizes understanding of CCM Face to Face encounter immediately following PCP f/u with Dr. Baird Cancer set for 01/01/19 at 3 PM . Adherence to keeping all scheduled MD appointments  Please see past updates related to this goal by clicking on the "Past Updates" button in the selected goal      Carolyn Russo was given information about Chronic Care Management services today including:  1. CCM service includes personalized support from designated clinical staff supervised by her physician, including individualized plan of care and coordination with other care providers 2. 24/7 contact phone numbers for assistance for urgent and routine care needs. 3. Service will only be billed when office clinical staff spend 20 minutes or more in a month to coordinate care. 4. Only  one practitioner may furnish and bill the service in a calendar month. 5. The patient may stop CCM services at any time (effective at the end of the month) by phone call to the office staff. 6. The patient will be responsible for cost sharing (co-pay) of up to 20% of the service fee (after annual deductible is met).  Patient agreed to services and verbal consent obtained.   The CM team will reach out to the patient again over the next 7-14 days.   Barb Merino, RN,CCM Care Management Coordinator [place] [facility]  Direct Phone: [phone]

## 2019-01-02 NOTE — Patient Instructions (Signed)
Visit Information  Goals Addressed    Patient Stated   . "I am checking my blood sugars every morning" (pt-stated)       Current Barriers:  Marland Kitchen Knowledge Deficits related to Diabetes . Non-adherence to prescribed medication regimen (patient refused insulin until recently)  Nurse Case Manager Clinical Goal(s):  Marland Kitchen Over the next 30 days, patient will verbalize understanding of plan for ongoing treatment for Diabetes management  Interventions:  . Face to Face CCM visit completed with patient . Review of most recent laboratory data related to DM . Education provided about s/s of hypo & hyperglycemia and how to manage at home, including when to call HCP if needed . Discussion about emergency measures related to DM  Verbal and written education provided to patient related to diabetic meal planning, using the plate method, discussed portion sizes, carb counting and knowing how many carbs to each with each meal/snack  Provided "Living Well with Diabetes" booklet  Provided [facility] blue notebook calendar with blood glucose monitoring log  Provided written instructions for Diabetic education and support class offered by [facility] (patient will consider attending)  Assessed knowledge/understanding and comfort level with self-monitoring CBG's  Instructed on best time to monitor glucose levels, preprandial and or if feeling symptomatic  Assessed for knowledge and understanding of importance of daily foot inspection (pt inspects every night; has foot specialist)  Assessed for adherence to annual eye exam (pt has Ophthalmology f/u scheduled) . Provided patient with RNCM contact #; provided [facility] 24/7 nurse advice line phone # . Scheduled a CCM telephone follow up call with patient   Patient Self Care Activities:   Verbalizes understanding of the education/information provided during visit . Self administers medications as prescribed (recently agreed to start insulin) . Attends all scheduled provider  appointments (reschedules if unable to keep appointment) . Calls pharmacy for medication refills . Attends church or other social activities . Performs ADL's independently . Performs IADL's independently . Calls provider office for new concerns or questions  Plan:  . Patient will continue to monitor her fasting CBG's daily and record her readings  . Patient will call RNCM and or Dr. Almetta Lovely office to report abnormal CBG's . RNCM will follow up with patient by telephone over the next 1-2 weeks   Initial goal documentation    . COMPLETED: "I would appreciate getting help with rescheduling my doctors appointment with Dr. Chalmers Cater. My husband will have surgery on the day I am scheduled to be seen" (pt-stated)       Current Barriers:  Marland Kitchen Knowledge deficit related to Diabetes Mellitus disease process and Self Health Management as evidence by Hgb A1c of 15.2 . Spouse is scheduled for surgery on the day of new patient appointment with Endocrinology  Nurse Case Manager Clinical Goal(s): Over the next 30 days, patient will have completed her new patient appointment with Dr. Chalmers Cater, Endocrinologist for evaluation of uncontrolled Diabetes.   Interventions:   Face to Face visit completed with patient in the office for her initial visit  Assessed for knowledge of Diabetes treatment plan as recommended by Endocrinologist  Assessed for adherence to following prescribed treatment plan, including taking her insulin exactly as prescribed  Assessed for knowledge and comfort level for self monitoring CBG's as directed  Assessed for most recent CBG's taken; (pt reports, today fasting CBG=100, yesterday am fasting CBG=137, 12/30/18 fasting CBG=191)  Assessed for knowledge of and planned adherence for Endo f/u appt   Patient Self Care Activities:  [name]  understanding of new patient appt w/Endocrinologist scheduled for tomorrow . Verbalizes understanding of CCM Face to Face encounter immediately  following PCP f/u with Dr. Baird Cancer set for 01/01/19 at 3 PM . Adherence to keeping all scheduled MD appointments  Please see past updates related to this goal by clicking on the "Past Updates" button in the selected goal      Education or Materials Provided:  . Yes: [facility] blue notebook calendar, diabetic meal planning, using the plate method, discussed portion sizes, carb counting and knowing how many carbs to each with each meal/snack;  "Living Well with Diabetes" booklet; Counting Carbs book; information sheet for Diabetic education and support class offered by [facility]; s/s hypo/hyperglycemia and how to self manage/when to call the doctor  Ms. Rissmiller was given information about Chronic Care Management services today including:  1. CCM service includes personalized support from designated clinical staff supervised by her physician, including individualized plan of care and coordination with other care providers 2. 24/7 contact phone numbers for assistance for urgent and routine care needs. 3. Service will only be billed when office clinical staff spend 20 minutes or more in a month to coordinate care. 4. Only one practitioner may furnish and bill the service in a calendar month. 5. The patient may stop CCM services at any time (effective at the end of the month) by phone call to the office staff. 6. The patient will be responsible for cost sharing (co-pay) of up to 20% of the service fee (after annual deductible is met).  Patient agreed to services and verbal consent obtained.   The patient verbalized understanding of instructions provided today and declined a print copy of patient instruction materials.   The CM team will reach out to the patient again over the next 7-14 days.   Barb Merino, RN,CCM Care Management Coordinator [place] [facility]  Direct Phone: [phone]

## 2019-01-03 DIAGNOSIS — E1165 Type 2 diabetes mellitus with hyperglycemia: Secondary | ICD-10-CM | POA: Diagnosis not present

## 2019-01-03 DIAGNOSIS — N189 Chronic kidney disease, unspecified: Secondary | ICD-10-CM | POA: Diagnosis not present

## 2019-01-03 DIAGNOSIS — I10 Essential (primary) hypertension: Secondary | ICD-10-CM | POA: Diagnosis not present

## 2019-01-03 DIAGNOSIS — E11319 Type 2 diabetes mellitus with unspecified diabetic retinopathy without macular edema: Secondary | ICD-10-CM | POA: Diagnosis not present

## 2019-01-03 DIAGNOSIS — E7800 Pure hypercholesterolemia, unspecified: Secondary | ICD-10-CM | POA: Diagnosis not present

## 2019-01-04 ENCOUNTER — Telehealth: Payer: Self-pay

## 2019-01-06 ENCOUNTER — Other Ambulatory Visit: Payer: Self-pay | Admitting: Internal Medicine

## 2019-01-08 ENCOUNTER — Ambulatory Visit: Payer: Self-pay

## 2019-01-08 DIAGNOSIS — N183 Chronic kidney disease, stage 3 unspecified: Secondary | ICD-10-CM

## 2019-01-08 DIAGNOSIS — E1122 Type 2 diabetes mellitus with diabetic chronic kidney disease: Secondary | ICD-10-CM

## 2019-01-08 DIAGNOSIS — I1310 Hypertensive heart and chronic kidney disease without heart failure, with stage 1 through stage 4 chronic kidney disease, or unspecified chronic kidney disease: Secondary | ICD-10-CM

## 2019-01-08 NOTE — Chronic Care Management (AMB) (Signed)
  Chronic Care Management   Social Work Note  01/08/2019 Name: MAKENZIE WEISNER MRN: [id number] DOB: [birth year]  Arleth F Manzella is a 68 y.o. year old female who sees [name], MD for primary care. The CCM team was consulted for assistance with [employer].   Second unsuccessful call to Mrs. Naas to discuss resources related to light-housekeeping and respite care. SW left a HIPAA compliant voice message requesting a return call.  Follow Up Plan: SW will follow up with patient by phone over the next week.  Daneen Schick, BSW, CDP [facility] / [facility] Care Management Social Worker [phone]  Total time spent performing care coordination and/or care management activities with the patient by phone or face to face = 3 minutes.

## 2019-01-09 ENCOUNTER — Ambulatory Visit: Payer: Self-pay

## 2019-01-09 DIAGNOSIS — I1310 Hypertensive heart and chronic kidney disease without heart failure, with stage 1 through stage 4 chronic kidney disease, or unspecified chronic kidney disease: Secondary | ICD-10-CM

## 2019-01-09 DIAGNOSIS — E1122 Type 2 diabetes mellitus with diabetic chronic kidney disease: Secondary | ICD-10-CM

## 2019-01-09 DIAGNOSIS — N183 Chronic kidney disease, stage 3 unspecified: Secondary | ICD-10-CM

## 2019-01-09 NOTE — Chronic Care Management (AMB) (Signed)
Chronic Care Management    Clinical Social Work General Note  01/09/2019 Name: Carolyn Russo MRN: [id number] DOB: Jun 23, 1951  Carolyn Russo is a 68 y.o. year old female who is a primary care patient of [name], MD. The CCM was consulted to assist the patient with chronic disease management related to DM and care coordination of community resource needs.  [name] was given information about Chronic Care Management services today including:  1. CCM service includes personalized support from designated clinical staff supervised by her physician, including individualized plan of care and coordination with other care providers 2. 24/7 contact phone numbers for assistance for urgent and routine care needs. 3. Service will only be billed when office clinical staff spend 20 minutes or more in a month to coordinate care. 4. Only one practitioner may furnish and bill the service in a calendar month. 5. The patient may stop CCM services at any time (effective at the end of the month) by phone call to the office staff. 6. The patient will be responsible for cost sharing (co-pay) of up to 20% of the service fee (after annual deductible is met).  Patient agreed to services and verbal consent obtained.   Review of patient status, including review of consultants reports, relevant laboratory and other test results, and collaboration with appropriate care team members and the patient's provider was performed as part of comprehensive patient evaluation and provision of chronic care management services.    SDOH (Social Determinants of Health) screening performed today. See Care Plan Entry related to challenges with: Financial Strain . The patient reports interest in obtaining someone to assist with light-housekeeping but an inability to afford many agencies due to living off a fixed income and recent medical concerns with her husband.  During today's call I also educated the patient on the importance  of social distancing and practicing proper hand hygiene at this time. The patient reports she has a bottle of hand sanitizer at there front door to use when entering the home. 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 The patient reports she is aware of concerns surrounding Covid-19 and will continue to practice proper hand washing techniques.  Goals Addressed            This Visit's Progress     Patient Stated    "I want to find someone to help me clean" (pt-stated)       Current Barriers:   Financial constraints   Caregiver stress due to recent health of patients spouse  Clinical Social Work Clinical Goal(s):   Over the next 30 days, patient will work with SW to identify resources to assist with light housekeeping  Interventions:  Patient interviewed and appropriate assessments performed  Discussed plans with patient for ongoing care management follow up and provided patient with direct contact information for care management team  Collaborated with [employer]  (community agency) re: Psychologist, occupational and/or community members to meet the patients needs within her budget   SW advised the patient that due to minimum budget it may be difficult to locate a resource to meet her needs  Patient Self Care Activities:   Self administers medications as prescribed  Attends all scheduled provider appointments  Calls pharmacy for medication refills  Attends church or other social activities  Performs ADL's independently  Initial goal documentation         Follow Up Plan: SW will follow up  with patient by phone over the next 10 days.       Daneen Schick, BSW, CDP [facility] / [facility] Care Management Social Worker [phone]  Total time spent performing care coordination and/or care management activities with the patient by phone or face to face = 33 minutes.

## 2019-01-09 NOTE — Patient Instructions (Signed)
Social Worker Visit Information  Goals we discussed today:  Goals Addressed            This Visit's Progress     Patient Stated   . "I want to find someone to help me clean" (pt-stated)       Current Barriers:  . Financial constraints  . Caregiver stress due to recent health of patients spouse  Clinical Social Work Clinical Goal(s):  Marland Kitchen Over the next 30 days, patient will work with SW to identify resources to assist with light housekeeping  Interventions: . Patient interviewed and appropriate assessments performed . Discussed plans with patient for ongoing care management follow up and provided patient with direct contact information for care management team . Collaborated with [employer] [place]  (community agency) re: Psychologist, occupational and/or community members to meet the patients needs within her budget  . SW advised the patient that due to minimum budget it may be difficult to locate a resource to meet her needs  Patient Self Care Activities:  . Self administers medications as prescribed . Attends all scheduled provider appointments . Calls pharmacy for medication refills . Attends church or other social activities . Performs ADL's independently  Initial goal documentation        Handwashing is one of the best ways to protect yourself and your family from getting sick. Wash Your Hands Often to Stay Healthy  When: You can help yourself and your loved ones stay healthy by washing your hands often, especially during these key times when you are likely to get and spread germs: . Before, during, and after preparing food  . Before eating food  . Before and after caring for someone at home who is sick with vomiting or diarrhea  . Before and after treating a cut or wound  . After using the toilet  . After being in public spaces . After changing diapers or cleaning up a child who has used the toilet  . After blowing your nose, coughing, or sneezing  . After touching an  animal, animal feed, or animal waste  . After handling pet food or pet treats  . After touching garbage . After touching public doors, telephones, shopping carts, or other surfaces .  Five Steps to [place] the Right [address] 1. Wet your hands with clean, running water (warm or cold), turn off the tap, and apply soap. 2. Lather your hands by rubbing them together with the soap. Lather the backs of your hands, between your fingers, and under your nails. 3. Scrub your hands for at least 20 seconds. Need a timer? Hum the "Happy Birthday" song from beginning to end twice. 4. Rinse your hands well under clean, running water. 5. Dry your hands using a clean towel or air dry them.  Source: [employer]   Materials Provided: Verbal education about community resources and Product/process development scientist provided by phone  Follow Up Plan: SW will follow up with patient by phone over the next 10 days   Daneen Schick, BSW, CDP [facility] / [facility] Care Management Social Worker [phone]

## 2019-01-11 ENCOUNTER — Other Ambulatory Visit: Payer: Self-pay

## 2019-01-11 ENCOUNTER — Telehealth: Payer: Medicare Other

## 2019-01-11 ENCOUNTER — Ambulatory Visit: Payer: Self-pay

## 2019-01-11 DIAGNOSIS — N183 Chronic kidney disease, stage 3 unspecified: Secondary | ICD-10-CM

## 2019-01-11 DIAGNOSIS — E1122 Type 2 diabetes mellitus with diabetic chronic kidney disease: Secondary | ICD-10-CM | POA: Diagnosis not present

## 2019-01-11 DIAGNOSIS — I1310 Hypertensive heart and chronic kidney disease without heart failure, with stage 1 through stage 4 chronic kidney disease, or unspecified chronic kidney disease: Secondary | ICD-10-CM

## 2019-01-11 NOTE — Chronic Care Management (AMB) (Signed)
  Chronic Care Management   Follow Up Note   01/11/2019 Name: Carolyn Russo MRN: [id number] DOB: Jan 26, 1951  Referred by: Carolyn Chard, MD Reason for referral : Chronic Care Management (CCM FOLLOW UP CALL )   Carolyn Russo is a 68 y.o. year old female who is a primary care patient of Carolyn Chard, MD. The CCM team was consulted for assistance with chronic disease management and care coordination needs.    I spoke with Carolyn Russo today for a CCM follow up.   Goals Addressed      Patient Stated   . "I am checking my blood sugars every morning" (pt-stated)       Current Barriers:  Marland Kitchen Knowledge Deficits related to Diabetes . Non-adherence to prescribed medication regimen (patient refused insulin until recently)  Nurse Case Manager Clinical Goal(s):  Marland Kitchen Over the next 30 days, patient will verbalize understanding of plan for ongoing treatment for Diabetes management  Interventions:  . Telephone CCM follow up with patient . Assessed for hypo/hyperglycemia episodes (pt denies)  Reinforced importance of using the plate method, adhering to portion sizes and carb counting (pt admits to indulging in eating homemade cornbread and birthday cake)   Assessed for ongoing comfort level with self-monitoring CBG's and self administering insulin therapy (monitoring in the am and in pm, reports average since last visit is around 146)  Reinforced best time to monitor glucose levels, preprandial and or if feeling symptomatic  Encouraged patient to implement daily walks in neighborhood while on COVID-19 restrictions . Confirmed patient has RN CM contact number . Scheduled a CCM telephone follow up call with patient   Patient Self Care Activities:   Verbalizes understanding of the education/information provided during visit . Self administers medications as prescribed (recently agreed to start insulin) . Attends all scheduled provider appointments (reschedules if unable to keep appointment) .  Calls pharmacy for medication refills . Attends church or other social activities . Performs ADL's independently . Performs IADL's independently . Calls provider office for new concerns or questions  Plan:  . Patient will continue to monitor her fasting CBG's daily and record her readings  . Patient will call RNCM and or Carolyn Russo office to report abnormal CBG's . RNCM will follow up with patient by telephone over the next 1-2 weeks   Please see past updates related to this goal by clicking on the "Past Updates" button in the selected goal       RN CM will follow up with patient again in 1-2 weeks.    Carolyn Merino, RN,CCM Care Management Coordinator [place] [facility]  Direct Phone: [phone]

## 2019-01-11 NOTE — Patient Instructions (Signed)
Visit Information  Goals Addressed      Patient Stated   . "I am checking my blood sugars every morning" (pt-stated)       Current Barriers:  Marland Kitchen Knowledge Deficits related to Diabetes . Non-adherence to prescribed medication regimen (patient refused insulin until recently)  Nurse Case Manager Clinical Goal(s):  Marland Kitchen Over the next 30 days, patient will verbalize understanding of plan for ongoing treatment for Diabetes management  Interventions:  . Telephone CCM follow up with patient . Assessed for hypo/hyperglycemia episodes (pt denies)  Reinforced importance of using the plate method, adhering to portion sizes and carb counting (pt admits to indulging in eating homemade cornbread and birthday cake)   Assessed for ongoing comfort level with self-monitoring CBG's and self administering insulin therapy (monitoring in the am and in pm, reports average since last visit is around 146)  Reinforced best time to monitor glucose levels, preprandial and or if feeling symptomatic  Encouraged patient to implement daily walks in neighborhood while on COVID-19 restrictions . Confirmed patient has RN CM contact number . Scheduled a CCM telephone follow up call with patient   Patient Self Care Activities:   Verbalizes understanding of the education/information provided during visit . Self administers medications as prescribed (recently agreed to start insulin) . Attends all scheduled provider appointments (reschedules if unable to keep appointment) . Calls pharmacy for medication refills . Attends church or other social activities . Performs ADL's independently . Performs IADL's independently . Calls provider office for new concerns or questions  Plan:  . Patient will continue to monitor her fasting CBG's daily and record her readings  . Patient will call RNCM and or Dr. Almetta Lovely office to report abnormal CBG's . RNCM will follow up with patient by telephone over the next 1-2 weeks   Please  see past updates related to this goal by clicking on the "Past Updates" button in the selected goal      The patient verbalized understanding of instructions provided today and declined a print copy of patient instruction materials.   The CM team will reach out to the patient again over the next 7-14 days.   Barb Merino, RN,CCM Care Management Coordinator [facility]  Direct Phone: [phone]

## 2019-01-13 ENCOUNTER — Other Ambulatory Visit: Payer: Self-pay | Admitting: Interventional Cardiology

## 2019-01-14 ENCOUNTER — Other Ambulatory Visit: Payer: Self-pay

## 2019-01-14 ENCOUNTER — Ambulatory Visit: Payer: Medicare Other

## 2019-01-14 DIAGNOSIS — E1122 Type 2 diabetes mellitus with diabetic chronic kidney disease: Secondary | ICD-10-CM

## 2019-01-14 DIAGNOSIS — I1310 Hypertensive heart and chronic kidney disease without heart failure, with stage 1 through stage 4 chronic kidney disease, or unspecified chronic kidney disease: Secondary | ICD-10-CM

## 2019-01-14 DIAGNOSIS — N183 Chronic kidney disease, stage 3 unspecified: Secondary | ICD-10-CM

## 2019-01-14 NOTE — Chronic Care Management (AMB) (Signed)
  Chronic Care Management    Clinical Social Work Follow Up Note  01/14/2019 Name: Carolyn Russo MRN: [id number] DOB: 11-13-1950  Siah F Stefanko is a 68 y.o. year old female who is a primary care patient of [name], MD. The CCM team was consulted for assistance with [employer].   Review of patient status, including review of consultants reports, other relevant assessments, and collaboration with appropriate care team members and the patient's provider was performed as part of comprehensive patient evaluation and provision of chronic care management services.    I reached out to the patient on today's date to share suggested resource of [employer] to obtain a housekeeper within the patients limited budget. The patient reports she will investigate this resource suggestion further. No other SW needs identified during today's call.   Goals Addressed            This Visit's Progress     Patient Stated   . "I want to find someone to help me clean" (pt-stated)   On Track    Current Barriers:  . Financial constraints  . Caregiver stress due to recent health of patients spouse  Clinical Social Work Clinical Goal(s):  Marland Kitchen Over the next 30 days, patient will work with SW to identify resources to assist with light housekeeping  Interventions: . SW collaborated with Annabell Sabal with [employer] [place] who suggested the patient look into [employer] for resource needss  . SW spoke with the patient to discuss option of utilizing [employer] for resource needs  Patient Self Care Activities:  . Self administers medications as prescribed . Attends all scheduled provider appointments . Calls pharmacy for medication refills . Attends church or other social activities . Performs ADL's independently  . The patient can access [employer] to learn more about services offerred  Please see past updates related to this goal by clicking on the "Past Updates" button in the selected goal           Follow Up Plan: SW will follow up with patient by phone over the next month. The patient has this SW contact number and is aware she may call if needed prior to the next scheduled call.   Daneen Schick, BSW, CDP [facility] / [facility] Care Management Social Worker [phone]  Total time spent performing care coordination and/or care management activities with the patient by phone or face to face = 10 minutes.

## 2019-01-14 NOTE — Patient Instructions (Signed)
Social Worker Visit Information  Goals we discussed today:  Goals Addressed            This Visit's Progress     Patient Stated   . "I want to find someone to help me clean" (pt-stated)   On track    Current Barriers:  . Financial constraints  . Caregiver stress due to recent health of patients spouse  Clinical Social Work Clinical Goal(s):  Marland Kitchen Over the next 30 days, patient will work with SW to identify resources to assist with light housekeeping  Interventions: . SW collaborated with Annabell Sabal with [employer] [facility] [place] who suggested the patient look into [employer] for resource needss  . SW spoke with the patient to discuss option of utilizing [employer] for resource needs  Patient Self Care Activities:  . Self administers medications as prescribed . Attends all scheduled provider appointments . Calls pharmacy for medication refills . Attends church or other social activities . Performs ADL's independently  . The patient can access [employer] to learn more about services offerred  Please see past updates related to this goal by clicking on the "Past Updates" button in the selected goal          Materials Provided: Verbal education about [email or web address] provided by phone  Follow Up Plan: SW will follow up with patient by phone over the next month   Daneen Schick, BSW, CDP [facility] / [facility] Care Management Social Worker [phone]

## 2019-01-15 ENCOUNTER — Ambulatory Visit: Payer: Medicare Other | Admitting: Registered"

## 2019-01-17 ENCOUNTER — Telehealth: Payer: Self-pay

## 2019-01-18 ENCOUNTER — Encounter (INDEPENDENT_AMBULATORY_CARE_PROVIDER_SITE_OTHER): Payer: Medicare Other | Admitting: Ophthalmology

## 2019-01-18 ENCOUNTER — Other Ambulatory Visit: Payer: Self-pay

## 2019-01-18 DIAGNOSIS — H43813 Vitreous degeneration, bilateral: Secondary | ICD-10-CM

## 2019-01-18 DIAGNOSIS — E11311 Type 2 diabetes mellitus with unspecified diabetic retinopathy with macular edema: Secondary | ICD-10-CM

## 2019-01-18 DIAGNOSIS — E113513 Type 2 diabetes mellitus with proliferative diabetic retinopathy with macular edema, bilateral: Secondary | ICD-10-CM | POA: Diagnosis not present

## 2019-01-18 DIAGNOSIS — H35033 Hypertensive retinopathy, bilateral: Secondary | ICD-10-CM

## 2019-01-18 DIAGNOSIS — M25551 Pain in right hip: Secondary | ICD-10-CM | POA: Diagnosis not present

## 2019-01-18 DIAGNOSIS — I10 Essential (primary) hypertension: Secondary | ICD-10-CM

## 2019-01-21 DIAGNOSIS — M7061 Trochanteric bursitis, right hip: Secondary | ICD-10-CM | POA: Diagnosis not present

## 2019-01-31 ENCOUNTER — Ambulatory Visit: Payer: Medicare Other | Admitting: Dietician

## 2019-01-31 ENCOUNTER — Ambulatory Visit: Payer: Medicare Other | Admitting: Registered"

## 2019-02-04 ENCOUNTER — Telehealth: Payer: Self-pay

## 2019-02-04 DIAGNOSIS — M7061 Trochanteric bursitis, right hip: Secondary | ICD-10-CM | POA: Diagnosis not present

## 2019-02-07 ENCOUNTER — Ambulatory Visit: Payer: Self-pay

## 2019-02-07 ENCOUNTER — Telehealth: Payer: Self-pay

## 2019-02-07 DIAGNOSIS — M7061 Trochanteric bursitis, right hip: Secondary | ICD-10-CM | POA: Diagnosis not present

## 2019-02-07 DIAGNOSIS — N183 Chronic kidney disease, stage 3 unspecified: Secondary | ICD-10-CM

## 2019-02-07 DIAGNOSIS — E1122 Type 2 diabetes mellitus with diabetic chronic kidney disease: Secondary | ICD-10-CM

## 2019-02-07 DIAGNOSIS — I1310 Hypertensive heart and chronic kidney disease without heart failure, with stage 1 through stage 4 chronic kidney disease, or unspecified chronic kidney disease: Secondary | ICD-10-CM

## 2019-02-07 NOTE — Chronic Care Management (AMB) (Signed)
  Chronic Care Management   Social Work Note  02/07/2019 Name: DENINA RIEGER MRN: [id number] DOB: 06-Apr-1951  Gwenda F Barretto is a 68 y.o. year old female who sees [name], MD for primary care. The CCM team was consulted for assistance with care coordination of resource needs.  I placed an unsuccessful call to the patient to assess for SW needs. The patients voice mailbox was full Database administrator from leaving a message requesting a return call.  Follow Up Plan: SW will follow up with patient by phone over the next 7-10 days.  Daneen Schick, BSW, CDP [facility] / [facility] Care Management Social Worker [phone]  Total time spent performing care coordination and/or care management activities with the patient by phone or face to face = 5 minutes.

## 2019-02-08 ENCOUNTER — Ambulatory Visit: Payer: Medicare Other | Admitting: Podiatry

## 2019-02-11 DIAGNOSIS — M7061 Trochanteric bursitis, right hip: Secondary | ICD-10-CM | POA: Diagnosis not present

## 2019-02-13 DIAGNOSIS — M7061 Trochanteric bursitis, right hip: Secondary | ICD-10-CM | POA: Diagnosis not present

## 2019-02-14 ENCOUNTER — Ambulatory Visit: Payer: Self-pay

## 2019-02-14 DIAGNOSIS — E1122 Type 2 diabetes mellitus with diabetic chronic kidney disease: Secondary | ICD-10-CM

## 2019-02-14 DIAGNOSIS — N183 Chronic kidney disease, stage 3 unspecified: Secondary | ICD-10-CM

## 2019-02-14 DIAGNOSIS — I1310 Hypertensive heart and chronic kidney disease without heart failure, with stage 1 through stage 4 chronic kidney disease, or unspecified chronic kidney disease: Secondary | ICD-10-CM

## 2019-02-14 NOTE — Chronic Care Management (AMB) (Signed)
erroneous encounter- see previous entry

## 2019-02-14 NOTE — Chronic Care Management (AMB) (Signed)
  Chronic Care Management   Social Work Note  02/14/2019 Name: Carolyn Russo MRN: [id number] DOB: 04/17/1951  Carolyn Russo is a 68 y.o. year old female who sees [name], MD for primary care. The CCM team was consulted for assistance with chronic care management and care coordination of resource needs.  I placed a follow up call to the patient on today's date in efforts to assess progression of patient stated goals. Unfortunately, today's call was unsuccessful. The patients voice mailbox was full, prohibiting SW from leaving a HIPAA compliant message.  Follow Up Plan: SW will follow up with patient by phone over the next 7-10 days.  Daneen Schick, BSW, CDP [facility] / [facility] Care Management Social Worker [phone]  Total time spent performing care coordination and/or care management activities with the patient by phone or face to face = 3 minutes.

## 2019-02-14 NOTE — Patient Instructions (Signed)
Social Worker Visit Information   Materials Provided: No. Patient not reached.  Follow Up Plan: SW will follow up with patient by phone over the next 7-10 days   Daneen Schick, [place], CDP [facility] / [facility] Social Worker [phone]

## 2019-02-14 NOTE — Chronic Care Management (AMB) (Signed)
  Chronic Care Management   Social Work Note  02/14/2019 Name: ASHANNA HEINSOHN MRN: [id number] DOB: 06-18-1951  Ticia F Sherburne is a 68 y.o. year old female who sees [name], MD for primary care. The CCM team was consulted for assistance with chronic care management and care coordination of resource needs.  I received a return call from the patient who reports she is in process of taking her husband to [facility] for a follow up after a recent inpatient stay. Patient requests follow up call with CCM SW "tomorrow".  Follow Up Plan: Appointment scheduled for SW follow up with client by phone on: 02/15/19.  Daneen Schick, BSW, CDP [facility] / [facility] Care Management Social Worker [phone]  Total time spent performing care coordination and/or care management activities with the patient by phone or face to face = 5 minutes.

## 2019-02-15 ENCOUNTER — Ambulatory Visit (INDEPENDENT_AMBULATORY_CARE_PROVIDER_SITE_OTHER): Payer: Medicare Other

## 2019-02-15 ENCOUNTER — Other Ambulatory Visit: Payer: Self-pay

## 2019-02-15 ENCOUNTER — Ambulatory Visit: Payer: Medicare Other

## 2019-02-15 DIAGNOSIS — E1122 Type 2 diabetes mellitus with diabetic chronic kidney disease: Secondary | ICD-10-CM

## 2019-02-15 DIAGNOSIS — N183 Chronic kidney disease, stage 3 unspecified: Secondary | ICD-10-CM

## 2019-02-15 NOTE — Patient Instructions (Signed)
Social Worker Visit Information  Goals we discussed today:  Goals Addressed            This Visit's Progress     Patient Stated   . "I want to find someone to help me clean" (pt-stated)   Not on track    Current Barriers:  . Financial constraints  . Caregiver stress due to recent health of patients spouse  Clinical Social Work Clinical Goal(s):  Marland Kitchen Over the next 30 days, patient will work with SW to identify resources to assist with light housekeeping o Not met restarted 02/15/19  Interventions: . Telephonic follow up by CCM SW . Assessed progression of patient stated goal - the patient has not had time to interview candidates as she has been caring for her husband who has had a recent inpatient stay and receiving IV therapy in the home . Developed plan for SW follow up  Patient Self Care Activities:  . Self administers medications as prescribed . Attends all scheduled provider appointments . Calls pharmacy for medication refills . Attends church or other social activities . Performs ADL's independently   Please see past updates related to this goal by clicking on the "Past Updates" button in the selected goal          Materials Provided: No: Patient declined  Follow Up Plan: SW will follow up with patient by phone over the next 2-3 weeks   Daneen Schick, [place], CDP [facility] / [place] Worker [phone]

## 2019-02-15 NOTE — Patient Instructions (Signed)
Visit Information   Insulin Treatment for Diabetes Mellitus Diabetes (diabetes mellitus) is a long-term (chronic) disease. It occurs when the body does not properly use sugar (glucose) that is released from food after digestion. Glucose levels are controlled by a hormone called insulin. Insulin is made in the pancreas, which is an organ behind the stomach.  If you have type 1 diabetes, you must take insulin because your pancreas does not make any.  If you have type 2 diabetes, you might need to take insulin along with other medicines. In type 2 diabetes, one or both of these problems may be present: ? The pancreas does not make enough insulin. ? Cells in the body do not respond properly to insulin that the body makes (insulin resistance). You must use insulin correctly to control your diabetes. You must have some insulin in your body at all times. Insulin treatment varies depending on your type of diabetes, your treatment goals, and your medical history. Ask questions to understand your insulin treatment plan so you can be an active partner in managing your diabetes. How is insulin given? Insulin can only be given through a shot (injection). It is injected using a syringe and needle, an insulin pen, a pump, or a jet injector. Your health care provider will:  Prescribe the type and amount of insulin that you need.  Tell you when you should inject your insulin. Where on the body should insulin be injected? Insulin is injected into a layer of fatty tissue under the skin. Good places to inject insulin include:  Abdomen. Generally, the abdomen is the best place to inject insulin. However, you should avoid any area that is less than 2 inches (5 cm) from the belly button (navel).  Front of thigh.  Upper, outer side of thigh.  Upper, outer side of arm.  Upper, outer part of buttock. It is important to:  Give your injection in a slightly different place each time. This helps to prevent  irritation and improve absorption.  Avoid injecting into areas that have scar tissue. Usually, you will give yourself insulin injections. Others can also be taught how to give you injections. You will use a special type of syringe that is made only for insulin. Some people may have an insulin pump that delivers insulin steadily through a tube (cannula) that is placed under the skin. What are the different types of insulin? The following information is a general guide to different types of insulin. Specifics vary depending on the insulin product that your health care provider prescribes.  Rapid-acting insulin: ? Starts working quickly, in as little as 5 minutes. ? Can last for 4-6 hours (or sometimes longer). ? Works well when taken right before a meal to quickly lower your blood glucose.  Short-acting insulin: ? Starts working in about 30 minutes. ? Can last for 6-10 hours. ? Should be taken about 30 minutes before you start eating a meal.  Intermediate-acting insulin: ? Starts working in 1-2 hours. ? Lasts for about 10-18 hours. ? Lowers your blood glucose for a longer period of time, but it is not as effective for lowering blood glucose right after a meal.  Long-acting insulin: ? Mimics the small amount of insulin that your pancreas usually produces throughout the day. ? Should be used one or two times a day. ? Is usually used in combination with other types of insulin or other medicines.  Concentrated insulin, or U-500 insulin: ? Contains a higher dose of insulin than most rapid-acting   insulins. U-500 insulin has 5 times the amount of insulin per 1 mL. ? Should only be used with the special U-500 syringe or U-500 insulin pen. It is dangerous to use the wrong type of syringe with this insulin. What are the side effects of insulin? Possible side effects of insulin treatment include:  Low blood glucose (hypoglycemia).  Weight gain.  High blood glucose (hyperglycemia).  Skin  injury or irritation. Some of these side effects can be caused by using improper injection technique. Be sure to learn how to inject insulin properly. What are common terms associated with insulin treatment? Some terms that you might hear include:  Basal insulin, or basal rate. This is the constant amount of insulin that needs to be present in your body to keep your blood glucose levels stable. People who have type 1 diabetes need basal insulin in a steady (continuous) dose 24 hours a day. ? Usually, intermediate-acting or long-acting insulin is used one or two times a day to manage basal insulin levels. ? Medicines that are taken by mouth may also be recommended to manage basal insulin levels.  Prandial or nutrition insulin. This refers to meal-related insulin. ? Blood glucose rises quickly after a meal (postprandial). Rapid-acting or short-acting insulin can be used right before a meal (preprandial) to quickly lower your blood glucose. ? You may be instructed to adjust the amount of prandial insulin that you take, based on how much carbohydrate (starch) is in your meal.  Corrective insulin. This may also be called a correction dose or supplemental dose. This is a small amount of rapid-acting or short-acting insulin that can be used to lower your blood glucose if it is too high. You may be instructed to check your blood glucose at certain times of the day and use corrective insulin as needed.  Tight control, or intensive therapy. This means keeping your blood glucose as close to your target as possible, and preventing your blood glucose from getting too high after meals. People who have tight control of their diabetes have fewer long-term problems caused by diabetes. Follow these instructions at home: Talk with your health care provider or pharmacist about the type of insulin you should take and when you should take it. You should know when your insulin goes up the most (peaks) and when it wears  off. You need this information so you can plan your meals and exercise. Work with your health care provider to:  Check your blood glucose every day. Your health care provider will tell you how often and when you should do this.  Manage your: ? Weight. ? Blood pressure. ? Cholesterol. ? Stress.  Eat a healthy diet.  Exercise regularly. Summary  Diabetes is a long-term (chronic) disease. It occurs when the body does not properly use sugar (glucose) that is released from food after digestion. Glucose levels are controlled by a hormone called insulin, which is made in an organ behind your stomach (pancreas).  You must use insulin correctly to control your diabetes. You must have some insulin in your body at all times.  Insulin treatment varies depending on your type of diabetes, your treatment goals, and your medical history.  Talk with your health care provider or pharmacist about the type of insulin you should take and when you should take it.  Check your blood glucose every day. Your health care provider will tell you how often and when you should check it. This information is not intended to replace advice given to   you by your health care provider. Make sure you discuss any questions you have with your health care provider. Document Released: 01/06/2009 Document Revised: 05/03/2017 Document Reviewed: 11/13/2015 Elsevier Interactive Patient Education  2019 Elsevier Inc.   Goals Addressed            This Visit's Progress   . "I am checking my blood sugars every morning" (pt-stated)       Current Barriers:  . Knowledge Deficits related to Diabetes . Non-adherence to prescribed medication regimen (patient refused insulin until recently)  Nurse Case Manager Clinical Goal(s):  . Over the next 30 days, patient will verbalize understanding of plan for ongoing treatment for Diabetes management MET . 02/15/19 - Over the next 30 days, patient will verbalize improved understanding of  plan of care for management of diabetes as evidenced by ability to share cbg findings, share exact dose of medications prescribed for management of DM  Interventions:  . Telephone CCM follow up with patient . Assessed for hypo/hyperglycemia episodes (pt denies)  Assessed medication management needs - patient states she was started on "low dose insulin" by her endocrinologist, education provided  Assessed for ongoing comfort level with self-monitoring CBG's and self administering insulin therapy (monitoring in the am and in pm, reports that her cbg's have been "up a bit since my husband has had surgeries in the last 5 weeks...there was a lot of stress"; reports HgA1C down to "about 12" when she visited endocrinologist, has follow up appointment in 6 months  Reinforced best time to monitor glucose levels, preprandial and or if feeling symptomatic  Encouraged patient to implement daily walks in neighborhood while on COVID-19 restrictions - patient states she is suffering from seasonal allergies which makes it difficult to exercise outside . Confirmed patient has RN CM contact number . Scheduled a CCM telephone follow up call with patient   Patient Self Care Activities:   Verbalizes understanding of the education/information provided during visit . Self administers medications as prescribed (recently agreed to start insulin) . Attends all scheduled provider appointments (reschedules if unable to keep appointment) . Calls pharmacy for medication refills . Attends church or other social activities . Performs ADL's independently . Performs IADL's independently . Calls provider office for new concerns or questions  Plan:  . Patient will continue to monitor her fasting CBG's daily and record her readings  . Patient will call RNCM and or Dr. Balan's office to report abnormal CBG's . RNCM will follow up with patient by telephone over the next 1-2 weeks   Please see past updates related to this  goal by clicking on the "Past Updates" button in the selected goal          The patient verbalized understanding of instructions provided today and declined a print copy of patient instruction materials.   The CM team will reach out to the patient again over the next 14 days.     MHA,BSN,RN,CCM Nurse Care Coordinator [facility] Management [phone]   

## 2019-02-15 NOTE — Chronic Care Management (AMB) (Signed)
  Chronic Care Management    Clinical Social Work Follow Up Note  02/15/2019 Name: Carolyn Russo MRN: [id number] DOB: July 13, 1951  Carolyn Russo is a 68 y.o. year old female who is a primary care patient of [name], MD. The CCM team was consulted for assistance with [employer].   Review of patient status, including review of consultants reports, other relevant assessments, and collaboration with appropriate care team members and the patient's provider was performed as part of comprehensive patient evaluation and provision of chronic care management services.     Goals Addressed            This Visit's Progress     Patient Stated   . "I want to find someone to help me clean" (pt-stated)   Not on track    Current Barriers:  . Financial constraints  . Caregiver stress due to recent health of patients spouse  Clinical Social Work Clinical Goal(s):  Marland Kitchen Over the next 30 days, patient will work with SW to identify resources to assist with light housekeeping o Not met restarted 02/15/19  Interventions: . Telephonic follow up by CCM SW . Assessed progression of patient stated goal - the patient has not had time to interview candidates as she has been caring for her husband who has had a recent inpatient stay and receiving IV therapy in the home . Developed plan for SW follow up  Patient Self Care Activities:  . Self administers medications as prescribed . Attends all scheduled provider appointments . Calls pharmacy for medication refills . Attends church or other social activities . Performs ADL's independently   Please see past updates related to this goal by clicking on the "Past Updates" button in the selected goal          Follow Up Plan: SW will follow up with patient by phone over the next 2-3 weeks  Daneen Schick, BSW, CDP [facility] / [facility] Care Management Social Worker [phone]  Total time spent performing care coordination and/or care management  activities with the patient by phone or face to face = 15 minutes.

## 2019-02-15 NOTE — Chronic Care Management (AMB) (Signed)
Chronic Care Management   Follow Up Note   02/15/2019 Name: Carolyn Russo MRN: [id number] DOB: January 06, 1951  Referred by: [name], MD Reason for referral : No chief complaint on file.   Carolyn Russo is a 68 y.o. year old female who is a primary care patient of [name], MD. The CCM team was consulted for assistance with chronic disease management and care coordination needs.    Review of patient status, including review of consultants reports, relevant laboratory and other test results, and collaboration with appropriate care team members and the patient's provider was performed as part of comprehensive patient evaluation and provision of chronic care management services.    Goals Addressed            This Visit's Progress    "I am checking my blood sugars every morning" (pt-stated)       Lab Results  Component Value Date   HGBA1C 15.2 (H) 10/10/2018   HGBA1C 12.8 (H) 08/16/2018   HGBA1C 10.3 (H) 10/13/2011   Lab Results  Component Value Date   MICROALBUR 1.41 10/13/2011   LDLCALC 64 05/08/2017   CREATININE 1.83 (H) 10/10/2018   Current Barriers:   Knowledge Deficits related to Diabetes  Non-adherence to prescribed medication regimen (patient refused insulin until recently)  Nurse Case Manager Clinical Goal(s):   Over the next 30 days, patient will verbalize understanding of plan for ongoing treatment for Diabetes management MET  02/15/19 - Over the next 30 days, patient will verbalize improved understanding of plan of care for management of diabetes as evidenced by ability to share cbg findings, share exact dose of medications prescribed for management of DM  Interventions:   Telephone CCM follow up with patient  Assessed for hypo/hyperglycemia episodes (pt denies)  Assessed medication management needs - patient states she was started on "low dose insulin" by her endocrinologist, did not have medications in front of her and couldn't tell me exactly  what she is taking  Assessed for ongoing comfort level with self-monitoring CBG's and self administering insulin therapy (monitoring in the am and in pm, reports that her cbg's have been "up a bit since my husband has had surgeries in the last 5 weeks...there was a lot of stress"; reports HgA1C down to "about 12" when she visited endocrinologist, has follow up appointment in 6 months  Reinforced best time to monitor glucose levels, preprandial and or if feeling symptomatic  Encouraged patient to implement daily walks in neighborhood while on COVID-19 restrictions - patient states she is suffering from seasonal allergies which makes it difficult to exercise outside  Confirmed patient has RN CM contact number  Scheduled a CCM telephone follow up call with patient   Patient Self Care Activities:   Verbalizes understanding of the education/information provided during visit  Self administers medications as prescribed (recently agreed to start insulin)  Attends all scheduled provider appointments (reschedules if unable to keep appointment)  Calls pharmacy for medication refills  Attends church or other social activities  Performs ADL's independently  Performs IADL's independently  Calls provider office for new concerns or questions  Plan:   Patient will continue to monitor her fasting CBG's daily and record her readings   Patient will call RNCM and or Dr. Almetta Lovely office to report abnormal CBG's  RNCM will follow up with patient by telephone over the next 1-2 weeks   Please see past updates related to this goal by clicking on the "Past Updates" button in the selected  goal     The CM team will reach out to the patient again over the next 14 days.   [place] Nurse Care Coordinator [facility] Care Management [phone]

## 2019-02-19 DIAGNOSIS — M7061 Trochanteric bursitis, right hip: Secondary | ICD-10-CM | POA: Diagnosis not present

## 2019-02-21 ENCOUNTER — Encounter (INDEPENDENT_AMBULATORY_CARE_PROVIDER_SITE_OTHER): Payer: Medicare Other | Admitting: Ophthalmology

## 2019-02-25 ENCOUNTER — Other Ambulatory Visit: Payer: Self-pay

## 2019-02-25 ENCOUNTER — Encounter (INDEPENDENT_AMBULATORY_CARE_PROVIDER_SITE_OTHER): Payer: Medicare Other | Admitting: Ophthalmology

## 2019-02-25 DIAGNOSIS — H43813 Vitreous degeneration, bilateral: Secondary | ICD-10-CM | POA: Diagnosis not present

## 2019-02-25 DIAGNOSIS — E11311 Type 2 diabetes mellitus with unspecified diabetic retinopathy with macular edema: Secondary | ICD-10-CM

## 2019-02-25 DIAGNOSIS — H35033 Hypertensive retinopathy, bilateral: Secondary | ICD-10-CM | POA: Diagnosis not present

## 2019-02-25 DIAGNOSIS — I10 Essential (primary) hypertension: Secondary | ICD-10-CM

## 2019-02-25 DIAGNOSIS — E113513 Type 2 diabetes mellitus with proliferative diabetic retinopathy with macular edema, bilateral: Secondary | ICD-10-CM

## 2019-02-27 DIAGNOSIS — M7062 Trochanteric bursitis, left hip: Secondary | ICD-10-CM | POA: Diagnosis not present

## 2019-02-28 ENCOUNTER — Telehealth: Payer: Self-pay

## 2019-03-03 ENCOUNTER — Other Ambulatory Visit: Payer: Self-pay | Admitting: Interventional Cardiology

## 2019-03-05 ENCOUNTER — Ambulatory Visit: Payer: Self-pay

## 2019-03-05 ENCOUNTER — Telehealth: Payer: Self-pay

## 2019-03-05 ENCOUNTER — Ambulatory Visit: Payer: Medicare Other | Admitting: Podiatry

## 2019-03-05 DIAGNOSIS — N183 Chronic kidney disease, stage 3 unspecified: Secondary | ICD-10-CM

## 2019-03-05 DIAGNOSIS — I1310 Hypertensive heart and chronic kidney disease without heart failure, with stage 1 through stage 4 chronic kidney disease, or unspecified chronic kidney disease: Secondary | ICD-10-CM

## 2019-03-05 DIAGNOSIS — E1122 Type 2 diabetes mellitus with diabetic chronic kidney disease: Secondary | ICD-10-CM

## 2019-03-05 DIAGNOSIS — M7061 Trochanteric bursitis, right hip: Secondary | ICD-10-CM | POA: Diagnosis not present

## 2019-03-05 NOTE — Chronic Care Management (AMB) (Signed)
  Chronic Care Management   Outreach Note  03/05/2019 Name: Carolyn Russo MRN: [id number] DOB: 14-Oct-1951  Referred by: [name], MD Reason for referral : Care Coordination   An unsuccessful telephone outreach was attempted today to follow up on progression of patient stated goals. [name] reported the patient to be out of the home at the time of CCM call. The patient was referred to the case management team by for assistance with chronic care management and care coordination.   Follow Up Plan: The CM team will reach out to the patient again over the next 5 days.   Daneen Schick, BSW, CDP [facility] Care Management Social Worker [phone]  Total time spent performing care coordination and/or care management activities with the patient by phone or face to face = 5 minutes.

## 2019-03-06 DIAGNOSIS — M7061 Trochanteric bursitis, right hip: Secondary | ICD-10-CM | POA: Diagnosis not present

## 2019-03-07 ENCOUNTER — Ambulatory Visit: Payer: Medicare Other

## 2019-03-07 DIAGNOSIS — E1122 Type 2 diabetes mellitus with diabetic chronic kidney disease: Secondary | ICD-10-CM

## 2019-03-07 DIAGNOSIS — N183 Chronic kidney disease, stage 3 unspecified: Secondary | ICD-10-CM

## 2019-03-07 DIAGNOSIS — I1310 Hypertensive heart and chronic kidney disease without heart failure, with stage 1 through stage 4 chronic kidney disease, or unspecified chronic kidney disease: Secondary | ICD-10-CM

## 2019-03-07 NOTE — Chronic Care Management (AMB) (Signed)
  Chronic Care Management    Clinical Social Work Follow Up Note  03/07/2019 Name: Carolyn Russo MRN: [id number] DOB: 11/05/1950  Carolyn Russo is a 68 y.o. year old female who is a primary care patient of [name], MD. The CCM team was consulted for assistance with [employer].   Review of patient status, including review of consultants reports, other relevant assessments, and collaboration with appropriate care team members and the patient's provider was performed as part of comprehensive patient evaluation and provision of chronic care management services.     I placed a follow up call to the patient on today's date to assess patient progress with SW goal.  Goals Addressed            This Visit's Progress     Patient Stated   . COMPLETED: "I want to find someone to help me clean" (pt-stated) This goal has not been met due to barriers with COVID 19 impacting in home services. The patient has demonstrated knowledge of how to achieve patient stated goal once it is safe to do so.       Current Barriers:  . Financial constraints  . Caregiver stress due to recent health of patients spouse . COVID 19 concerns  Clinical Social Work Clinical Goal(s):  Marland Kitchen Over the next 30 days, patient will work with SW to identify resources to assist with light housekeeping o Not met restarted 02/15/19  Interventions: . Telephonic follow up by CCM SW . Determined the patient has been unable to locate a housekeeper due to [place] 19 concerns - the patient reports no one is able to come into home to assist at this time . Advised the patient that most in home resources have restrictions at this time . Encouraged the patient to begin looking in a few weeks as the state progresses with each phase . Confirmed the patient had CCM SW contact information and encouraged the patient to contact SW when needed  Patient Self Care Activities:  . Self administers medications as prescribed . Attends all  scheduled provider appointments . Calls pharmacy for medication refills . Attends church or other social activities . Performs ADL's independently   Please see past updates related to this goal by clicking on the "Past Updates" button in the selected goal          Follow Up Plan: No further SW follow planned at this time. The patient is encouraged to contact CCM SW for any future SW needs.   Daneen Schick, BSW, CDP [facility] / [facility] Care Management Social Worker [phone]  Total time spent performing care coordination and/or care management activities with the patient by phone or face to face = 9 minutes.

## 2019-03-07 NOTE — Patient Instructions (Signed)
Social Worker Visit Information  Goals we discussed today:  Goals Addressed            This Visit's Progress     Patient Stated   . COMPLETED: "I want to find someone to help me clean" (pt-stated)       Current Barriers:  . Financial constraints  . Caregiver stress due to recent health of patients spouse . COVID 19 concerns  Clinical Social Work Clinical Goal(s):  Marland Kitchen Over the next 30 days, patient will work with SW to identify resources to assist with light housekeeping o Not met restarted 02/15/19  Interventions: . Telephonic follow up by CCM SW . Determined the patient has been unable to locate a housekeeper due to [place] 19 concerns - the patient reports no one is able to come into home to assist at this time . Advised the patient that most in home resources have restrictions at this time . Encouraged the patient to begin looking in a few weeks as the state progresses with each phase . Confirmed the patient had CCM SW contact information and encouraged the patient to contact SW when needed  Patient Self Care Activities:  . Self administers medications as prescribed . Attends all scheduled provider appointments . Calls pharmacy for medication refills . Attends church or other social activities . Performs ADL's independently   Please see past updates related to this goal by clicking on the "Past Updates" button in the selected goal          Materials Provided: No: Patient declined  Follow Up Plan: No SW follow up planned at this time.   Daneen Schick, BSW, CDP [facility] / [facility] Care Management Social Worker [phone]

## 2019-03-12 ENCOUNTER — Telehealth: Payer: Self-pay

## 2019-03-12 ENCOUNTER — Ambulatory Visit (INDEPENDENT_AMBULATORY_CARE_PROVIDER_SITE_OTHER): Payer: Medicare Other

## 2019-03-12 DIAGNOSIS — N183 Chronic kidney disease, stage 3 unspecified: Secondary | ICD-10-CM

## 2019-03-12 DIAGNOSIS — I1310 Hypertensive heart and chronic kidney disease without heart failure, with stage 1 through stage 4 chronic kidney disease, or unspecified chronic kidney disease: Secondary | ICD-10-CM | POA: Diagnosis not present

## 2019-03-12 DIAGNOSIS — M7061 Trochanteric bursitis, right hip: Secondary | ICD-10-CM | POA: Diagnosis not present

## 2019-03-12 DIAGNOSIS — E1122 Type 2 diabetes mellitus with diabetic chronic kidney disease: Secondary | ICD-10-CM

## 2019-03-12 NOTE — Chronic Care Management (AMB) (Signed)
   Chronic Care Management   Outreach Note  03/12/2019 Name: Carolyn Russo MRN: [id number] DOB: 28-Feb-1951  Referred by: [name], MD Reason for referral : Chronic Care Management (CM RN Telephone Follow Up )   An unsuccessful telephone outreach was attempted today. The patient was referred to the case management team by [name] MD for assistance with chronic care management and care coordination.    Follow Up Plan: Telephone follow up appointment with CCM team member scheduled for: 03/13/19    Barb Merino, [facility] Care Management Coordinator [place] [facility]  Direct Phone: [phone]

## 2019-03-13 ENCOUNTER — Telehealth: Payer: Self-pay

## 2019-03-19 ENCOUNTER — Other Ambulatory Visit: Payer: Self-pay

## 2019-03-19 ENCOUNTER — Encounter: Payer: Self-pay | Admitting: Registered"

## 2019-03-19 ENCOUNTER — Encounter: Payer: Medicare Other | Attending: Endocrinology | Admitting: Registered"

## 2019-03-19 VITALS — Ht 62.0 in | Wt 178.2 lb

## 2019-03-19 DIAGNOSIS — E1165 Type 2 diabetes mellitus with hyperglycemia: Secondary | ICD-10-CM | POA: Diagnosis not present

## 2019-03-19 DIAGNOSIS — M7061 Trochanteric bursitis, right hip: Secondary | ICD-10-CM | POA: Diagnosis not present

## 2019-03-19 NOTE — Progress Notes (Signed)
Diabetes Self-Management Education  Visit Type: First/Initial  Appt. Start Time: 1130 Appt. End Time: 1230  03/19/2019  [name], identified by name and date of birth, is a 68 y.o. female with a diagnosis of Diabetes: Type 2.   Pt appears to be somewhat hard of hearing.  ASSESSMENT  Height 5\' 2"  (1.575 m), weight 178 lb 3.2 oz (80.8 kg). Body mass index is 32.59 kg/m.  Pt states both her PCP and Dr. Chalmers Cater would like to make sure she understands how diet affects blood sugar. Pt states she is also interested in all aspects of DM. Pt states her FBG are pretty good if she doesn't eat too late and the wrong stuff at night.  Pt states her husband recently had a heart attack and they are both being careful with their diet and exercise.  Pt states her physical activity consists of PT 2x/week and does PT exercises at home which take about 30 min. Pt states before social distancing she was doing aquatics at [facility] and after COVID restrictions eased and gyms open back up plans to get back to that. Pt states she also enjoys walking laps in [employer] or [employer]. Likes that there are places to sit down, there is a convenient restroom and water available. Pt does not like walking in neighborhood because she doesn't like people's dogs approaching her. Pt states she has tried a home video that her son recommended. Pt doesn't like doing stairs in her home because it is boring.  Energy level: mostly fine but wiped out after PT. Sleep: watches news and Carolyn Russo at 11 pm calms her down and she falls asleep, sometimes falls asleep in bedroom. Wakes up 4:30 -5 am. 5-6 hrs at night, 1 hr naps in afternoon. Has always had sleeping pattern of staying up late and waking up early.  Diabetes Self-Management Education - 03/19/19 1125      Visit Information   Visit Type  First/Initial      Initial Visit   Diabetes Type  Type 2    Are you currently following a meal plan?  Yes    What type of meal plan  do you follow?   portion control    Are you taking your medications as prescribed?  Yes   novolog   Date Diagnosed  GDM 34 yrs ago      Psychosocial Assessment   Patient Belief/Attitude about Diabetes  Motivated to manage diabetes    How often do you need to have someone help you when you read instructions, pamphlets, or other written materials from your doctor or pharmacy?  1 - Never    What is the last grade level you completed in school?  PhD      Complications   Last HgB A1C per patient/outside source  15.2 %   per referral   How often do you check your blood sugar?  1-2 times/day    Fasting Blood glucose range (mg/dL)  --   127-191   Number of hypoglycemic episodes per month  0    Have you had a dilated eye exam in the past 12 months?  Yes    Have you had a dental exam in the past 12 months?  Yes    Are you checking your feet?  Yes    How many days per week are you checking your feet?  7      Dietary Intake   Breakfast  leftovers fish & cabbage, water  Snack (morning)  none or apple    Lunch  sliced [place], slice of cheese, 1 piece bread 40 cal.    Snack (afternoon)  none    Dinner  perch, cabbage, 2x2 sqaure corn bread or brown rice    Snack (evening)  none    Beverage(s)  water, peppermint tea no sweetener      Exercise   Exercise Type  Other (comment)   physical therapy   How many days per week to you exercise?  2    How many minutes per day do you exercise?  30    Total minutes per week of exercise  60      Patient Education   Previous Diabetes Education  Yes (please comment)   doctor's office   Nutrition management   Role of diet in the treatment of diabetes and the relationship between the three main macronutrients and blood glucose level    Physical activity and exercise   Role of exercise on diabetes management, blood pressure control and cardiac health.    Monitoring  Other (comment)   N0I   Chronic complications  Relationship between chronic complications  and blood glucose control    Psychosocial adjustment  Other (comment)   sleep     Individualized Goals (developed by patient)   Nutrition  General guidelines for healthy choices and portions discussed    Physical Activity  Exercise 3-5 times per week      Outcomes   Expected Outcomes  Demonstrated interest in learning. Expect positive outcomes    Future DMSE  4-6 wks    Program Status  Completed       Individualized Plan for Diabetes Self-Management Training:   Learning Objective:  Patient will have a greater understanding of diabetes self-management. Patient education plan is to attend individual and/or group sessions per assessed needs and concerns.    Patient Instructions  Continue to eat balanced meals and lots of vegetables. When eating fruit be sure to eat protein. Consider having something to eat (for example apple & PB) before PT to see if it helps you have more energy when you are done. Continue staying active. To get the most benefit for blood sugar control aim to have a block of time at least 10 min of continual movement with a total of 30-45 min 3-5 times per week. Continue taking medication and checking blood sugar as directed by your doctor.    Expected Outcomes:  Demonstrated interest in learning. Expect positive outcomes  Education material provided: A1C conversion sheet  If problems or questions, patient to contact team via:  Phone  Future DSME appointment: 4-6 wks

## 2019-03-19 NOTE — Patient Instructions (Signed)
Continue to eat balanced meals and lots of vegetables. When eating fruit be sure to eat protein. Consider having something to eat (for example apple & PB) before PT to see if it helps you have more energy when you are done. Continue staying active. To get the most benefit for blood sugar control aim to have a block of time at least 10 min of continual movement with a total of 30-45 min 3-5 times per week. Continue taking medication and checking blood sugar as directed by your doctor.

## 2019-03-20 DIAGNOSIS — E669 Obesity, unspecified: Secondary | ICD-10-CM | POA: Diagnosis not present

## 2019-03-20 DIAGNOSIS — E1165 Type 2 diabetes mellitus with hyperglycemia: Secondary | ICD-10-CM | POA: Diagnosis not present

## 2019-03-20 DIAGNOSIS — D631 Anemia in chronic kidney disease: Secondary | ICD-10-CM | POA: Diagnosis not present

## 2019-03-20 DIAGNOSIS — E1122 Type 2 diabetes mellitus with diabetic chronic kidney disease: Secondary | ICD-10-CM | POA: Diagnosis not present

## 2019-03-20 DIAGNOSIS — N183 Chronic kidney disease, stage 3 (moderate): Secondary | ICD-10-CM | POA: Diagnosis not present

## 2019-03-20 DIAGNOSIS — E8770 Fluid overload, unspecified: Secondary | ICD-10-CM | POA: Diagnosis not present

## 2019-03-20 DIAGNOSIS — I129 Hypertensive chronic kidney disease with stage 1 through stage 4 chronic kidney disease, or unspecified chronic kidney disease: Secondary | ICD-10-CM | POA: Diagnosis not present

## 2019-03-27 ENCOUNTER — Other Ambulatory Visit: Payer: Self-pay

## 2019-03-27 ENCOUNTER — Encounter: Payer: Self-pay | Admitting: Internal Medicine

## 2019-03-27 ENCOUNTER — Ambulatory Visit (INDEPENDENT_AMBULATORY_CARE_PROVIDER_SITE_OTHER): Payer: Medicare Other | Admitting: Internal Medicine

## 2019-03-27 ENCOUNTER — Ambulatory Visit (INDEPENDENT_AMBULATORY_CARE_PROVIDER_SITE_OTHER): Payer: Medicare Other

## 2019-03-27 VITALS — BP 112/68 | HR 79 | Temp 97.4°F | Ht 62.0 in | Wt 178.2 lb

## 2019-03-27 VITALS — BP 112/68 | HR 79 | Temp 97.4°F | Ht 62.0 in | Wt 178.0 lb

## 2019-03-27 DIAGNOSIS — Z0000 Encounter for general adult medical examination without abnormal findings: Secondary | ICD-10-CM | POA: Diagnosis not present

## 2019-03-27 DIAGNOSIS — N183 Chronic kidney disease, stage 3 unspecified: Secondary | ICD-10-CM

## 2019-03-27 DIAGNOSIS — E6609 Other obesity due to excess calories: Secondary | ICD-10-CM

## 2019-03-27 DIAGNOSIS — E1122 Type 2 diabetes mellitus with diabetic chronic kidney disease: Secondary | ICD-10-CM | POA: Diagnosis not present

## 2019-03-27 DIAGNOSIS — I2510 Atherosclerotic heart disease of native coronary artery without angina pectoris: Secondary | ICD-10-CM

## 2019-03-27 DIAGNOSIS — E1159 Type 2 diabetes mellitus with other circulatory complications: Secondary | ICD-10-CM

## 2019-03-27 DIAGNOSIS — E785 Hyperlipidemia, unspecified: Secondary | ICD-10-CM

## 2019-03-27 DIAGNOSIS — J45909 Unspecified asthma, uncomplicated: Secondary | ICD-10-CM

## 2019-03-27 DIAGNOSIS — Z6832 Body mass index (BMI) 32.0-32.9, adult: Secondary | ICD-10-CM

## 2019-03-27 DIAGNOSIS — Z23 Encounter for immunization: Secondary | ICD-10-CM | POA: Diagnosis not present

## 2019-03-27 DIAGNOSIS — M7061 Trochanteric bursitis, right hip: Secondary | ICD-10-CM | POA: Diagnosis not present

## 2019-03-27 DIAGNOSIS — I1310 Hypertensive heart and chronic kidney disease without heart failure, with stage 1 through stage 4 chronic kidney disease, or unspecified chronic kidney disease: Secondary | ICD-10-CM | POA: Diagnosis not present

## 2019-03-27 LAB — POCT URINALYSIS DIPSTICK
Bilirubin, UA: NEGATIVE
Glucose, UA: POSITIVE — AB
Ketones, UA: NEGATIVE
Nitrite, UA: NEGATIVE
Protein, UA: POSITIVE — AB
Spec Grav, UA: 1.02 (ref 1.010–1.025)
Urobilinogen, UA: 0.2 E.U./dL
pH, UA: 5 (ref 5.0–8.0)

## 2019-03-27 LAB — POCT UA - MICROALBUMIN
Creatinine, POC: 300 mg/dL
Microalbumin Ur, POC: 150 mg/L

## 2019-03-27 MED ORDER — BUDESONIDE-FORMOTEROL FUMARATE 160-4.5 MCG/ACT IN AERO: 2.0000 | INHALATION_SPRAY | Freq: Two times a day (BID) | RESPIRATORY_TRACT | 5 refills | Status: DC

## 2019-03-27 MED ORDER — PNEUMOCOCCAL 13-VAL CONJ VACC IM SUSP: 0.5000 mL | INTRAMUSCULAR | 0 refills | Status: AC

## 2019-03-27 MED ORDER — MOMETASONE FURO-FORMOTEROL FUM 200-5 MCG/ACT IN AERO: 2.0000 | INHALATION_SPRAY | Freq: Two times a day (BID) | RESPIRATORY_TRACT | 2 refills | Status: DC

## 2019-03-27 NOTE — Progress Notes (Signed)
Subjective:   Carolyn Russo is a 68 y.o. female who presents for Medicare Annual (Subsequent) preventive examination.  Review of Systems:  n/a Cardiac Risk Factors include: advanced age (>87mn, >>72women);diabetes mellitus;hypertension;obesity (BMI >30kg/m2)     Objective:     Vitals: BP 112/68 (BP Location: Left Arm, Patient Position: Sitting)   Pulse 79   Temp (!) 97.4 F (36.3 C) (Oral)   Ht '5\' 2"'  (1.575 m)   Wt 178 lb (80.7 kg)   SpO2 98%   BMI 32.56 kg/m   Body mass index is 32.56 kg/m.  Advanced Directives 03/27/2019 03/19/2019 01/02/2019 04/10/2018 04/10/2018 04/04/2017 01/29/2017  Does Patient Have a Medical Advance Directive? No No No No No No No  Does patient want to make changes to medical advance directive? - Yes (MAU/Ambulatory/Procedural Areas - Information given) - - - - -  Would patient like information on creating a medical advance directive? No - Patient declined - Yes (MAU/Ambulatory/Procedural Areas - Information given) - - Yes (MAU/Ambulatory/Procedural Areas - Information given) No - Patient declined    Tobacco Social History   Tobacco Use  Smoking Status Never Smoker  Smokeless Tobacco Never Used     Counseling given: Not Answered   Clinical Intake:  Pre-visit preparation completed: Yes  Pain : 0-10 Pain Score: 3  Pain Type: Acute pain Pain Orientation: Right Pain Radiating Towards: none Pain Descriptors / Indicators: Sharp, Aching Pain Onset: More than a month ago Pain Frequency: Intermittent Pain Relieving Factors: APAP helps  Pain Relieving Factors: APAP helps  Nutritional Status: BMI > 30  Obese Nutritional Risks: None Diabetes: Yes CBG done?: No Did pt. bring in CBG monitor from home?: No  How often do you need to have someone help you when you read instructions, pamphlets, or other written materials from your doctor or pharmacy?: 1 - Never What is the last grade level you completed in school?: PhD  Interpreter Needed?: No   Information entered by :: NAllen LPN  Past Medical History:  Diagnosis Date  . Allergy   . Anterior chest wall pain   . Appendicitis 1965  . Asthma   . Body mass index 37.0-37.9, adult   . Breast pain   . Cataract    both eyes  . Chronic renal disease, stage II   . Dehydration 2014  . Deviated septum 1971  . Diabetes mellitus   . Dyspnea 2014  . Extrinsic asthma    WITH ASTHMA ATTACK  . Fibroid 1980  . GERD (gastroesophageal reflux disease)   . Heart murmur   . Hx gestational diabetes   . Hyperlipidemia   . Hypertension 2014  . Inguinal hernia 1959  . Malaise and fatigue 2014  . Nephropathy   . Non-IgE mediated allergic asthma 2014  . Obesity   . Pelvic pain   . Pregnancy, high-risk 1985  . Tonsillitis 1968  . Uterine fibroid 1980   Past Surgical History:  Procedure Laterality Date  . APPENDECTOMY  1959  . CESAREAN SECTION  1985  . COLONOSCOPY    . EYE SURGERY     bilateral cataract   . HERNIA REPAIR  1959  . LEFT HEART CATH AND CORONARY ANGIOGRAPHY N/A 04/04/2017   Procedure: Left Heart Cath and Coronary Angiography;  Surgeon: SBelva Crome MD;  Location[facility];  Service: Cardiovascular;  Laterality: N/A;  . [facility] 2004, 2007  . RHINOPLASTY  1971  . ROTATOR CUFF REPAIR  2003  .  SURGICAL REPAIR OF HEMORRHAGE  2015  . TONSILLECTOMY  1968   Family History  Problem Relation Age of Onset  . Cancer Mother        abdominal melamona  . Psoriasis Mother   . Alzheimer's disease Father   . Cancer Cousin        colon   . Diabetes Cousin   . Diabetes Maternal Aunt   . Colon cancer Neg Hx   . Colon polyps Neg Hx   . Esophageal cancer Neg Hx   . Rectal cancer Neg Hx   . Stomach cancer Neg Hx   . Breast cancer Neg Hx    Social History   Socioeconomic History  . Marital status: Married    Spouse name: Not on file  . Number of children: 1  . Years of education: Not on file  . Highest education level: Professional school degree (e.g.,  MD, DDS, DVM, JD)  Occupational History  . Occupation: retired  Scientific laboratory technician  . Financial resource strain: Somewhat hard  . Food insecurity:    Worry: Never true    Inability: Never true  . Transportation needs:    Medical: No    Non-medical: No  Tobacco Use  . Smoking status: Never Smoker  . Smokeless tobacco: Never Used  Substance and Sexual Activity  . Alcohol use: No  . Drug use: No  . Sexual activity: Yes    Birth control/protection: Post-menopausal  Lifestyle  . Physical activity:    Days per week: 0 days    Minutes per session: 0 min  . Stress: Not at all  Relationships  . Social connections:    Talks on phone: More than three times a week    Gets together: More than three times a week    Attends religious service: More than 4 times per year    Active member of club or organization: Yes    Attends meetings of clubs or organizations: More than 4 times per year    Relationship status: Married  Other Topics Concern  . Not on file  Social History Narrative   Patient reports she used to walk at [employer] but now due to Covid-19 she is staying inside.    Outpatient Encounter Medications as of 03/27/2019  Medication Sig  . albuterol (PROVENTIL HFA;VENTOLIN HFA) 108 (90 BASE) MCG/ACT inhaler Inhale 2 puffs into the lungs every 4 (four) hours as needed for wheezing or shortness of breath. (Patient not taking: Reported on 03/27/2019)  . aspirin 81 MG tablet Take 81 mg by mouth daily.  Marland Kitchen b complex vitamins capsule Take 1 capsule by mouth daily.  Marland Kitchen BESIVANCE 0.6 % SUSP INSTILL ONE GTT IN RIGHT EYE QID FOR 2 DAYS AFTER EACH MONTHLY EYE INJECTION  . budesonide-formoterol (SYMBICORT) 160-4.5 MCG/ACT inhaler Inhale 2 puffs into the lungs 2 (two) times daily.  . Cholecalciferol (VITAMIN D3 PO) Take 2,000 Int'l Units by mouth daily.  . [employer] CINNAMON PO Take 250 mg by mouth 2 (two) times daily.   . fluticasone (FLONASE) 50 MCG/ACT nasal spray Place 2 sprays into the nose daily as needed  for allergies.   Marland Kitchen glucose blood (ACCU-CHEK GUIDE) test strip Use as instructed to check blood sugars 2 times per day dx: e11.65  . insulin aspart protamine - aspart (NOVOLOG MIX 70/30 FLEXPEN) (70-30) 100 UNIT/ML FlexPen 10 Units 2 (two) times daily with a meal.   . lactobacillus acidophilus (BACID) TABS Take 1 tablet by mouth daily. PROBIOTIC  . magnesium  oxide (MAG-OX) 400 (241.3 Mg) MG tablet Take 1 tablet by mouth daily.  . milk thistle 175 MG tablet Take 525 mg by mouth daily. Takes 3 daily  . pneumococcal 13-valent conjugate vaccine (PREVNAR 13) SUSP injection Inject 0.5 mLs into the muscle tomorrow at 10 am for 1 dose.  . Polyvinyl Alcohol-Povidone (REFRESH OP) Apply to eye.  . rosuvastatin (CRESTOR) 5 MG tablet TAKE 1 TABLET(5 MG) BY MOUTH DAILY  . valACYclovir (VALTREX) 1000 MG tablet valacyclovir 1 gram tablet  . valsartan-hydrochlorothiazide (DIOVAN-HCT) 320-12.5 MG tablet Take 1 tablet by mouth daily. Please make overdue annual appt with Dr. Tamala Julian for future refills. Thank you  . [DISCONTINUED] valsartan (DIOVAN) 80 MG tablet Take 1 tablet (80 mg total) by mouth daily.   No facility-administered encounter medications on file as of 03/27/2019.     Activities of Daily Living In your present state of health, do you have any difficulty performing the following activities: 03/27/2019  Hearing? Y  Comment since people wear masks  Vision? N  Difficulty concentrating or making decisions? N  Walking or climbing stairs? N  Dressing or bathing? N  Doing errands, shopping? N  Preparing Food and eating ? N  Using the Toilet? N  In the past six months, have you accidently leaked urine? N  Do you have problems with loss of bowel control? N  Managing your Medications? N  Managing your Finances? N  Housekeeping or managing your Housekeeping? N  Some recent data might be hidden    Patient Care Team: [name], MD as PCP - General (Internal Medicine) Rex Kras, Claudette Stapler, RN as Case  Manager Daneen Schick as Social Worker    Assessment:   This is a routine wellness examination for Carolyn Russo.  Exercise Activities and Dietary recommendations Current Exercise Habits: Home exercise routine, Type of exercise: strength training/weights, Time (Minutes): 30, Frequency (Times/Week): 7, Weekly Exercise (Minutes/Week): 210  Goals    . "I am checking my blood sugars every morning" (pt-stated)     Current Barriers:  Marland Kitchen Knowledge Deficits related to Diabetes . Non-adherence to prescribed medication regimen (patient refused insulin until recently)  Nurse Case Manager Clinical Goal(s):  Marland Kitchen Over the next 30 days, patient will verbalize understanding of plan for ongoing treatment for Diabetes management MET . 02/15/19 - Over the next 30 days, patient will verbalize improved understanding of plan of care for management of diabetes as evidenced by ability to share cbg findings, share exact dose of medications prescribed for management of DM  Interventions:  . Telephone CCM follow up with patient . Assessed for hypo/hyperglycemia episodes (pt denies) . Assessed medication management needs - patient states she was started on "low dose insulin" by her endocrinologist, did not have medications in front of her and couldn't tell me exactly what she is taking; insulin education provided  Assessed for ongoing comfort level with self-monitoring CBG's and self administering insulin therapy (monitoring in the am and in pm, reports that her cbg's have been "up a bit since my husband has had surgeries in the last 5 weeks...there was a lot of stress"; reports HgA1C down to "about 12" when she visited endocrinologist, has follow up appointment in 6 months  Reinforced best time to monitor glucose levels, preprandial and or if feeling symptomatic  Encouraged patient to implement daily walks in neighborhood while on COVID-19 restrictions - patient states she is suffering from seasonal allergies which makes it  difficult to exercise outside . Confirmed patient has RN CM contact  number . Scheduled a CCM telephone follow up call with patient   Patient Self Care Activities:   Verbalizes understanding of the education/information provided during visit . Self administers medications as prescribed (recently agreed to start insulin) . Attends all scheduled provider appointments (reschedules if unable to keep appointment) . Calls pharmacy for medication refills . Attends church or other social activities . Performs ADL's independently . Performs IADL's independently . Calls provider office for new concerns or questions  Plan:  . Patient will continue to monitor her fasting CBG's daily and record her readings  . Patient will call RNCM and or Dr. Almetta Lovely office to report abnormal CBG's . RNCM will follow up with patient by telephone over the next 1-2 weeks   Please see past updates related to this goal by clicking on the "Past Updates" button in the selected goal       . HEMOGLOBIN A1C < 7.0     Wants to get A1C down       Fall Risk Fall Risk  03/27/2019 03/27/2019 03/19/2019 01/01/2019 10/10/2018  Falls in the past year? 0 0 0 0 0  Comment - - - - -  Number falls in past yr: - - - - -  Comment - - - - -  Injury with Fall? - - - - -  Risk for fall due to : Medication side effect - - - -   Is the patient's home free of loose throw rugs in walkways, pet beds, electrical cords, etc?   yes      Grab bars in the bathroom? yes      Handrails on the stairs?   yes      Adequate lighting?   yes  Timed Get Up and Go performed: n/a  Depression Screen PHQ 2/9 Scores 03/27/2019 03/19/2019 01/09/2019 01/01/2019  PHQ - 2 Score 0 0 0 0  PHQ- 9 Score 0 - - -     Cognitive Function     6CIT Screen 03/27/2019  What Year? 0 points  What month? 0 points  What time? 3 points  Count back from 20 0 points  Months in reverse 0 points  Repeat phrase 0 points  Total Score 3    Immunization History   Administered Date(s) Administered  . Influenza, High Dose Seasonal PF 08/16/2018    Qualifies for Shingles Vaccine? yes  Screening Tests Health Maintenance  Topic Date Due  . FOOT EXAM  11/10/1958  . TETANUS/TDAP  11/11/1967  . DEXA SCAN  03/26/2020 (Originally 11/10/2013)  . HEMOGLOBIN A1C  04/11/2019  . INFLUENZA VACCINE  05/25/2019  . OPHTHALMOLOGY EXAM  11/24/2019  . MAMMOGRAM  05/29/2020  . COLONOSCOPY  04/10/2021  . Hepatitis C Screening  Completed  . PNA vac Low Risk Adult  Completed    Cancer Screenings: Lung: Low Dose CT Chest recommended if Age 2-80 years, 30 pack-year currently smoking OR have quit w/in 15years. Patient does not qualify. Breast:  Up to date on Mammogram? Yes   Up to date of Bone Density/Dexa? Yes Colorectal: up to date  Additional Screenings: : Hepatitis C Screening: 01/01/2013     Plan:   Wants to get A1C down.  I have personally reviewed and noted the following in the patient's chart:   . Medical and social history . Use of alcohol, tobacco or illicit drugs  . Current medications and supplements . Functional ability and status . Nutritional status . Physical activity . Advanced directives . List of other  physicians . Hospitalizations, surgeries, and ER visits in previous 12 months . Vitals . Screenings to include cognitive, depression, and falls . Referrals and appointments  In addition, I have reviewed and discussed with patient certain preventive protocols, quality metrics, and best practice recommendations. A written personalized care plan for preventive services as well as general preventive health recommendations were provided to patient.     Kellie Simmering, LPN  11/26/4495

## 2019-03-27 NOTE — Patient Instructions (Signed)
Carolyn Russo , Thank you for taking time to come for your Medicare Wellness Visit. I appreciate your ongoing commitment to your health goals. Please review the following plan we discussed and let me know if I can assist you in the future.   Screening recommendations/referrals: Colonoscopy: 03/2018 Mammogram: 05/2018 Bone Density: does not remember date Recommended yearly ophthalmology/optometry visit for glaucoma screening and checkup Recommended yearly dental visit for hygiene and checkup  Vaccinations: Influenza vaccine: 07/2018 Pneumococcal vaccine: 08/2016 Tdap vaccine: does not remember date Shingles vaccine: discussed    Advanced directives: Advance directive discussed with you today. Even though you declined this today please call our office should you change your mind and we can give you the proper paperwork for you to fill out.   Conditions/risks identified: Obesity  Next appointment: 10/16/2019 11:30   Preventive Care 65 Years and Older, Female Preventive care refers to lifestyle choices and visits with your health care provider that can promote health and wellness. What does preventive care include?  A yearly physical exam. This is also called an annual well check.  Dental exams once or twice a year.  Routine eye exams. Ask your health care provider how often you should have your eyes checked.  Personal lifestyle choices, including:  Daily care of your teeth and gums.  Regular physical activity.  Eating a healthy diet.  Avoiding tobacco and drug use.  Limiting alcohol use.  Practicing safe sex.  Taking low-dose aspirin every day.  Taking vitamin and mineral supplements as recommended by your health care provider. What happens during an annual well check? The services and screenings done by your health care provider during your annual well check will depend on your age, overall health, lifestyle risk factors, and family history of disease. Counseling   Your health care provider may ask you questions about your:  Alcohol use.  Tobacco use.  Drug use.  Emotional well-being.  Home and relationship well-being.  Sexual activity.  Eating habits.  History of falls.  Memory and ability to understand (cognition).  Work and work Statistician.  Reproductive health. Screening  You may have the following tests or measurements:  Height, weight, and BMI.  Blood pressure.  Lipid and cholesterol levels. These may be checked every 5 years, or more frequently if you are over 80 years old.  Skin check.  Lung cancer screening. You may have this screening every year starting at age 52 if you have a 30-pack-year history of smoking and currently smoke or have quit within the past 15 years.  Fecal occult blood test (FOBT) of the stool. You may have this test every year starting at age 29.  Flexible sigmoidoscopy or colonoscopy. You may have a sigmoidoscopy every 5 years or a colonoscopy every 10 years starting at age 87.  Hepatitis C blood test.  Hepatitis B blood test.  Sexually transmitted disease (STD) testing.  Diabetes screening. This is done by checking your blood sugar (glucose) after you have not eaten for a while (fasting). You may have this done every 1-3 years.  Bone density scan. This is done to screen for osteoporosis. You may have this done starting at age 77.  Mammogram. This may be done every 1-2 years. Talk to your health care provider about how often you should have regular mammograms. Talk with your health care provider about your test results, treatment options, and if necessary, the need for more tests. Vaccines  Your health care provider may recommend certain vaccines, such as:  Influenza  vaccine. This is recommended every year.  Tetanus, diphtheria, and acellular pertussis (Tdap, Td) vaccine. You may need a Td booster every 10 years.  Zoster vaccine. You may need this after age 53.  Pneumococcal 13-valent  conjugate (PCV13) vaccine. One dose is recommended after age 8.  Pneumococcal polysaccharide (PPSV23) vaccine. One dose is recommended after age 48. Talk to your health care provider about which screenings and vaccines you need and how often you need them. This information is not intended to replace advice given to you by your health care provider. Make sure you discuss any questions you have with your health care provider. Document Released: 11/06/2015 Document Revised: 06/29/2016 Document Reviewed: 08/11/2015 Elsevier Interactive Patient Education  2017 [place] Prevention in the Home Falls can cause injuries. They can happen to people of all ages. There are many things you can do to make your home safe and to help prevent falls. What can I do on the outside of my home?  Regularly fix the edges of walkways and driveways and fix any cracks.  Remove anything that might make you trip as you walk through a door, such as a raised step or threshold.  Trim any bushes or trees on the path to your home.  Use bright outdoor lighting.  Clear any walking paths of anything that might make someone trip, such as rocks or tools.  Regularly check to see if handrails are loose or broken. Make sure that both sides of any steps have handrails.  Any raised decks and porches should have guardrails on the edges.  Have any leaves, snow, or ice cleared regularly.  Use sand or salt on walking paths during winter.  Clean up any spills in your garage right away. This includes oil or grease spills. What can I do in the bathroom?  Use night lights.  Install grab bars by the toilet and in the tub and shower. Do not use towel bars as grab bars.  Use non-skid mats or decals in the tub or shower.  If you need to sit down in the shower, use a plastic, non-slip stool.  Keep the floor dry. Clean up any water that spills on the floor as soon as it happens.  Remove soap buildup in the tub or  shower regularly.  Attach bath mats securely with double-sided non-slip rug tape.  Do not have throw rugs and other things on the floor that can make you trip. What can I do in the bedroom?  Use night lights.  Make sure that you have a light by your bed that is easy to reach.  Do not use any sheets or blankets that are too big for your bed. They should not hang down onto the floor.  Have a firm chair that has side arms. You can use this for support while you get dressed.  Do not have throw rugs and other things on the floor that can make you trip. What can I do in the kitchen?  Clean up any spills right away.  Avoid walking on wet floors.  Keep items that you use a lot in easy-to-reach places.  If you need to reach something above you, use a strong step stool that has a grab bar.  Keep electrical cords out of the way.  Do not use floor polish or wax that makes floors slippery. If you must use wax, use non-skid floor wax.  Do not have throw rugs and other things on the floor that can  make you trip. What can I do with my stairs?  Do not leave any items on the stairs.  Make sure that there are handrails on both sides of the stairs and use them. Fix handrails that are broken or loose. Make sure that handrails are as long as the stairways.  Check any carpeting to make sure that it is firmly attached to the stairs. Fix any carpet that is loose or worn.  Avoid having throw rugs at the top or bottom of the stairs. If you do have throw rugs, attach them to the floor with carpet tape.  Make sure that you have a light switch at the top of the stairs and the bottom of the stairs. If you do not have them, ask someone to add them for you. What else can I do to help prevent falls?  Wear shoes that:  Do not have high heels.  Have rubber bottoms.  Are comfortable and fit you well.  Are closed at the toe. Do not wear sandals.  If you use a stepladder:  Make sure that it is fully  opened. Do not climb a closed stepladder.  Make sure that both sides of the stepladder are locked into place.  Ask someone to hold it for you, if possible.  Clearly mark and make sure that you can see:  Any grab bars or handrails.  First and last steps.  Where the edge of each step is.  Use tools that help you move around (mobility aids) if they are needed. These include:  Canes.  Walkers.  Scooters.  Crutches.  Turn on the lights when you go into a dark area. Replace any light bulbs as soon as they burn out.  Set up your furniture so you have a clear path. Avoid moving your furniture around.  If any of your floors are uneven, fix them.  If there are any pets around you, be aware of where they are.  Review your medicines with your doctor. Some medicines can make you feel dizzy. This can increase your chance of falling. Ask your doctor what other things that you can do to help prevent falls. This information is not intended to replace advice given to you by your health care provider. Make sure you discuss any questions you have with your health care provider. Document Released: 08/06/2009 Document Revised: 03/17/2016 Document Reviewed: 11/14/2014 Elsevier Interactive Patient Education  2017 [employer].

## 2019-03-27 NOTE — Patient Instructions (Signed)
Exercising to Stay Healthy  To become healthy and stay healthy, it is recommended that you do moderate-intensity and vigorous-intensity exercise. You can tell that you are exercising at a moderate intensity if your heart starts beating faster and you start breathing faster but can still hold a conversation. You can tell that you are exercising at a vigorous intensity if you are breathing much harder and faster and cannot hold a conversation while exercising.  Exercising regularly is important. It has many health benefits, such as:  Improving overall fitness, flexibility, and endurance.  Increasing bone density.  Helping with weight control.  Decreasing body fat.  Increasing muscle strength.  Reducing stress and tension.  Improving overall health.  How often should I exercise?  Choose an activity that you enjoy, and set realistic goals. Your health care provider can help you make an activity plan that works for you.  Exercise regularly as told by your health care provider. This may include:  Doing strength training two times a week, such as:  Lifting weights.  Using resistance bands.  Push-ups.  Sit-ups.  Yoga.  Doing a certain intensity of exercise for a given amount of time. Choose from these options:  A total of 150 minutes of moderate-intensity exercise every week.  A total of 75 minutes of vigorous-intensity exercise every week.  A mix of moderate-intensity and vigorous-intensity exercise every week.  Children, pregnant women, people who have not exercised regularly, people who are overweight, and older adults may need to talk with a health care provider about what activities are safe to do. If you have a medical condition, be sure to talk with your health care provider before you start a new exercise program.  What are some exercise ideas?    Moderate-intensity exercise ideas include:  Walking 1 mile (1.6 km) in about 15 minutes.  Biking.  Hiking.  Golfing.  Dancing.  Water aerobics.  Vigorous-intensity  exercise ideas include:  Walking 4.5 miles (7.2 km) or more in about 1 hour.  Jogging or running 5 miles (8 km) in about 1 hour.  Biking 10 miles (16.1 km) or more in about 1 hour.  Lap swimming.  Roller-skating or in-line skating.  Cross-country skiing.  Vigorous competitive sports, such as football, basketball, and soccer.  Jumping rope.  Aerobic dancing.  What are some everyday activities that can help me to get exercise?  Yard work, such as:  Pushing a lawn mower.  Raking and bagging leaves.  Washing your car.  Pushing a stroller.  Shoveling snow.  Gardening.  Washing windows or floors.  How can I be more active in my day-to-day activities?  Use stairs instead of an elevator.  Take a walk during your lunch break.  If you drive, park your car farther away from your work or school.  If you take public transportation, get off one stop early and walk the rest of the way.  Stand up or walk around during all of your indoor phone calls.  Get up, stretch, and walk around every 30 minutes throughout the day.  Enjoy exercise with a friend. Support to continue exercising will help you keep a regular routine of activity.  What guidelines can I follow while exercising?  Before you start a new exercise program, talk with your health care provider.  Do not exercise so much that you hurt yourself, feel dizzy, or get very short of breath.  Wear comfortable clothes and wear shoes with good support.  Drink plenty of water while   you exercise to prevent dehydration or heat stroke.  Work out until your breathing and your heartbeat get faster.  Where to find more information  [place] Department of Health and Human Services: www.hhs.gov  Centers for Disease Control and Prevention (CDC): www.cdc.gov  Summary  Exercising regularly is important. It will improve your overall fitness, flexibility, and endurance.  Regular exercise also will improve your overall health. It can help you control your weight, reduce stress, and improve your bone  density.  Do not exercise so much that you hurt yourself, feel dizzy, or get very short of breath.  Before you start a new exercise program, talk with your health care provider.  This information is not intended to replace advice given to you by your health care provider. Make sure you discuss any questions you have with your health care provider.  Document Released: 11/12/2010 Document Revised: 08/31/2017 Document Reviewed: 08/31/2017  Elsevier Interactive Patient Education  2019 [employer]

## 2019-03-28 ENCOUNTER — Ambulatory Visit: Payer: Self-pay

## 2019-03-28 DIAGNOSIS — E785 Hyperlipidemia, unspecified: Secondary | ICD-10-CM

## 2019-03-28 DIAGNOSIS — R0609 Other forms of dyspnea: Secondary | ICD-10-CM

## 2019-03-28 DIAGNOSIS — E1159 Type 2 diabetes mellitus with other circulatory complications: Secondary | ICD-10-CM

## 2019-03-28 DIAGNOSIS — R0600 Dyspnea, unspecified: Secondary | ICD-10-CM

## 2019-03-28 LAB — LIPID PANEL
Chol/HDL Ratio: 3.6 ratio (ref 0.0–4.4)
Cholesterol, Total: 132 mg/dL (ref 100–199)
HDL: 37 mg/dL — ABNORMAL LOW (ref >39)
LDL Calculated: 49 mg/dL (ref 0–99)
Triglycerides: 229 mg/dL — ABNORMAL HIGH (ref 0–149)
VLDL Cholesterol Cal: 46 mg/dL — ABNORMAL HIGH (ref 5–40)

## 2019-03-28 LAB — CMP14+EGFR
ALT: 31 IU/L (ref 0–32)
AST: 15 IU/L (ref 0–40)
Albumin/Globulin Ratio: 1.4 (ref 1.2–2.2)
Albumin: 3.6 g/dL — ABNORMAL LOW (ref 3.8–4.8)
Alkaline Phosphatase: 84 IU/L (ref 39–117)
BUN/Creatinine Ratio: 21 (ref 12–28)
BUN: 39 mg/dL — ABNORMAL HIGH (ref 8–27)
Bilirubin Total: 0.3 mg/dL (ref 0.0–1.2)
CO2: 21 mmol/L (ref 20–29)
Calcium: 9.3 mg/dL (ref 8.7–10.3)
Chloride: 101 mmol/L (ref 96–106)
Creatinine, Ser: 1.88 mg/dL — ABNORMAL HIGH (ref 0.57–1.00)
GFR calc Af Amer: 31 mL/min/{1.73_m2} — ABNORMAL LOW (ref >59)
GFR calc non Af Amer: 27 mL/min/{1.73_m2} — ABNORMAL LOW (ref >59)
Globulin, Total: 2.5 g/dL (ref 1.5–4.5)
Glucose: 346 mg/dL — ABNORMAL HIGH (ref 65–99)
Potassium: 4.1 mmol/L (ref 3.5–5.2)
Sodium: 140 mmol/L (ref 134–144)
Total Protein: 6.1 g/dL (ref 6.0–8.5)

## 2019-03-28 LAB — SAR COV2 SEROLOGY (COVID19)AB(IGG),IA: SARS-CoV-2 Ab, IgG: NEGATIVE

## 2019-03-28 NOTE — Chronic Care Management (AMB) (Signed)
  Chronic Care Management   Outreach Note  03/28/2019 Name: DEZAREE TRACEY MRN: [id number] DOB: Jul 18, 1951  Referred by: [name], MD Reason for referral : Chronic Care Management (CCM RNCM Telephone Follow Up)   An unsuccessful telephone outreach was attempted today. The patient was referred to the case management team by [name] MD for assistance with chronic care management and care coordination.   Follow Up Plan: The care management team will reach out to the patient again over the next 5-7 days.   Barb Merino, RN,CCM Care Management Coordinator [place] [facility]  Direct Phone: [phone]

## 2019-03-31 NOTE — Progress Notes (Signed)
Subjective:     Patient ID: Carolyn Russo , female    DOB: 04-16-1951 , 68 y.o.   MRN: [id number]   Chief Complaint  Patient presents with  . Diabetes  . Hypertension    HPI  She is here today for dm f/u. She is now also followed by Dr. Chalmers Cater.  She is now taking insulin Novolog 32/99 instead of Trulicity. She reports compliance with her meds.   Diabetes  She presents for her follow-up diabetic visit. She has type 2 diabetes mellitus. There are no hypoglycemic associated symptoms. Pertinent negatives for diabetes include no blurred vision and no chest pain. There are no hypoglycemic complications. Risk factors for coronary artery disease include diabetes mellitus, dyslipidemia, hypertension, obesity, sedentary lifestyle and post-menopausal. She is compliant with treatment most of the time. She participates in exercise intermittently. Her breakfast blood glucose is taken between 8-9 am. Her breakfast blood glucose range is generally 130-140 mg/dl. An ACE inhibitor/angiotensin II receptor blocker is being taken. Eye exam is current.  Hypertension  This is a chronic problem. The current episode started more than 1 year ago. The problem has been gradually improving since onset. The problem is controlled. Pertinent negatives include no blurred vision, chest pain, palpitations or shortness of breath. The current treatment provides moderate improvement. Compliance problems include exercise.  Hypertensive end-organ damage includes kidney disease and CAD/MI.     Past Medical History:  Diagnosis Date  . Allergy   . Anterior chest wall pain   . Appendicitis 1965  . Asthma   . Body mass index 37.0-37.9, adult   . Breast pain   . Cataract    both eyes  . Chronic renal disease, stage II   . Dehydration 2014  . Deviated septum 1971  . Diabetes mellitus   . Dyspnea 2014  . Extrinsic asthma    WITH ASTHMA ATTACK  . Fibroid 1980  . GERD (gastroesophageal reflux disease)   . Heart murmur   . Hx  gestational diabetes   . Hyperlipidemia   . Hypertension 2014  . Inguinal hernia 1959  . Malaise and fatigue 2014  . Nephropathy   . Non-IgE mediated allergic asthma 2014  . Obesity   . Pelvic pain   . Pregnancy, high-risk 1985  . Tonsillitis 1968  . Uterine fibroid 1980     Family History  Problem Relation Age of Onset  . Cancer Mother        abdominal melamona  . Psoriasis Mother   . Alzheimer's disease Father   . Cancer Cousin        colon   . Diabetes Cousin   . Diabetes Maternal Aunt   . Colon cancer Neg Hx   . Colon polyps Neg Hx   . Esophageal cancer Neg Hx   . Rectal cancer Neg Hx   . Stomach cancer Neg Hx   . Breast cancer Neg Hx      Current Outpatient Medications:  .  aspirin 81 MG tablet, Take 81 mg by mouth daily., Disp: , Rfl:  .  b complex vitamins capsule, Take 1 capsule by mouth daily., Disp: , Rfl:  .  BESIVANCE 0.6 % SUSP, INSTILL ONE GTT IN RIGHT EYE QID FOR 2 DAYS AFTER EACH MONTHLY EYE INJECTION, Disp: , Rfl:  .  budesonide-formoterol (SYMBICORT) 160-4.5 MCG/ACT inhaler, Inhale 2 puffs into the lungs 2 (two) times daily., Disp: 1 Inhaler, Rfl: 5 .  Cholecalciferol (VITAMIN D3 PO), Take 2,000 Int'l Units by mouth  daily., Disp: , Rfl:  .  [employer] CINNAMON PO, Take 250 mg by mouth 2 (two) times daily. , Disp: , Rfl:  .  fluticasone (FLONASE) 50 MCG/ACT nasal spray, Place 2 sprays into the nose daily as needed for allergies. , Disp: , Rfl:  .  furosemide (LASIX) 40 MG tablet, furosemide 40 mg tablet, Disp: , Rfl:  .  glucose blood (ACCU-CHEK GUIDE) test strip, Use as instructed to check blood sugars 2 times per day dx: e11.65, Disp: 100 each, Rfl: 11 .  insulin aspart protamine - aspart (NOVOLOG MIX 70/30 FLEXPEN) (70-30) 100 UNIT/ML FlexPen, 10 Units 2 (two) times daily with a meal. , Disp: , Rfl:  .  lactobacillus acidophilus (BACID) TABS, Take 1 tablet by mouth daily. PROBIOTIC, Disp: , Rfl:  .  magnesium oxide (MAG-OX) 400 (241.3 Mg) MG tablet, Take 1  tablet by mouth daily., Disp: , Rfl:  .  milk thistle 175 MG tablet, Take 525 mg by mouth daily. Takes 3 daily, Disp: , Rfl:  .  Polyvinyl Alcohol-Povidone (REFRESH OP), Apply to eye., Disp: , Rfl:  .  rosuvastatin (CRESTOR) 5 MG tablet, TAKE 1 TABLET(5 MG) BY MOUTH DAILY, Disp: 90 tablet, Rfl: 0 .  valsartan-hydrochlorothiazide (DIOVAN-HCT) 320-12.5 MG tablet, Take 1 tablet by mouth daily. Please make overdue annual appt with Dr. Tamala Julian for future refills. Thank you, Disp: 30 tablet, Rfl: 2 .  albuterol (PROVENTIL HFA;VENTOLIN HFA) 108 (90 BASE) MCG/ACT inhaler, Inhale 2 puffs into the lungs every 4 (four) hours as needed for wheezing or shortness of breath. (Patient not taking: Reported on 03/27/2019), Disp: 1 Inhaler, Rfl: 3 .  mometasone-formoterol (DULERA) 200-5 MCG/ACT AERO, Inhale 2 puffs into the lungs 2 (two) times daily., Disp: 1 Inhaler, Rfl: 2 .  valACYclovir (VALTREX) 1000 MG tablet, valacyclovir 1 gram tablet, Disp: , Rfl:    Allergies  Allergen Reactions  . Food Anaphylaxis    Peanuts; Almonds  . Other Shortness Of Breath    Peanuts; Almonds  . Wheat Bran Shortness Of Breath  . Statins Itching and Other (See Comments)    Generalized aches  . Iodine Other (See Comments)  . Shellfish Allergy Other (See Comments)    Mouth gets raw  . Tetracycline Other (See Comments)    Raw mouth  . Tetracyclines & Related Itching  . Contrast Media [Iodinated Diagnostic Agents] Rash    Happened during CT scan over 30 years ago     Review of Systems  Constitutional: Negative.   Eyes: Negative for blurred vision.  Respiratory: Negative.  Negative for shortness of breath.   Cardiovascular: Negative.  Negative for chest pain and palpitations.  Gastrointestinal: Negative.   Neurological: Negative.   Psychiatric/Behavioral: Negative.      Today's Vitals   03/27/19 1156  BP: 112/68  Pulse: 79  Temp: (!) 97.4 F (36.3 C)  TempSrc: Oral  Weight: 178 lb 3.2 oz (80.8 kg)  Height: _0   (1.575 m)  PainSc: 3   PainLoc: Hip   Body mass index is 32.59 kg/m.   Objective:  Physical Exam Vitals signs and nursing note reviewed.  Constitutional:      Appearance: Normal appearance.  HENT:     Head: Normocephalic and atraumatic.  Cardiovascular:     Rate and Rhythm: Normal rate and regular rhythm.     Heart sounds: Normal heart sounds.  Pulmonary:     Effort: Pulmonary effort is normal.     Breath sounds: Normal breath sounds.  Skin:  General: Skin is warm.  Neurological:     General: No focal deficit present.     Mental Status: She is alert.  Psychiatric:        Mood and Affect: Mood normal.        Behavior: Behavior normal.         Assessment And Plan:     1. Type 2 diabetes mellitus with stage 3 chronic kidney disease, without long-term current use of insulin ([place])  I will check liver/kidney function today. She will have her next a1c performed at Dr. Almetta Lovely office.  I will also check SARS antibody as requested.   - SAR CoV2 Serology (COVID 19)AB(IGG)IA - CMP14+EGFR  2. Hypertensive heart and renal disease with renal failure, stage 1 through stage 4 or unspecified chronic kidney disease, without heart failure  Well controlled. She will continue with current meds. She is encouraged to avoid adding salt to her foods.   3. Dyslipidemia (high LDL; low HDL)  I will check lipid panel and forward to her cardiologist for his review.  She is encouraged to avoid fried foods and increase her daily activity level.  - Lipid panel  4. Class 1 obesity due to excess calories with serious comorbidity and body mass index (BMI) of 32.0 to 32.9 in adult  Importance of achieving optimal weight to decrease risk of cardiovascular disease and cancers was discussed with the patient in full detail. She is encouraged to start slowly - start with 10 minutes twice daily at least three to four days per week and to gradually build to 30 minutes five days weekly. She was given tips  to incorporate more activity into her daily routine - take stairs when possible, park farther away from grocery stores, etc.    Maximino Greenland, MD    THE PATIENT IS ENCOURAGED TO PRACTICE SOCIAL DISTANCING DUE TO THE COVID-19 PANDEMIC.

## 2019-04-03 ENCOUNTER — Encounter (INDEPENDENT_AMBULATORY_CARE_PROVIDER_SITE_OTHER): Payer: Medicare Other | Admitting: Ophthalmology

## 2019-04-04 ENCOUNTER — Encounter (INDEPENDENT_AMBULATORY_CARE_PROVIDER_SITE_OTHER): Payer: Medicare Other | Admitting: Ophthalmology

## 2019-04-04 ENCOUNTER — Other Ambulatory Visit: Payer: Self-pay

## 2019-04-04 ENCOUNTER — Telehealth: Payer: Self-pay

## 2019-04-04 DIAGNOSIS — E113513 Type 2 diabetes mellitus with proliferative diabetic retinopathy with macular edema, bilateral: Secondary | ICD-10-CM | POA: Diagnosis not present

## 2019-04-04 DIAGNOSIS — H43813 Vitreous degeneration, bilateral: Secondary | ICD-10-CM

## 2019-04-04 DIAGNOSIS — E11311 Type 2 diabetes mellitus with unspecified diabetic retinopathy with macular edema: Secondary | ICD-10-CM

## 2019-04-04 DIAGNOSIS — H35033 Hypertensive retinopathy, bilateral: Secondary | ICD-10-CM | POA: Diagnosis not present

## 2019-04-04 DIAGNOSIS — I10 Essential (primary) hypertension: Secondary | ICD-10-CM | POA: Diagnosis not present

## 2019-04-09 ENCOUNTER — Ambulatory Visit: Payer: Medicare Other | Admitting: Podiatry

## 2019-04-18 ENCOUNTER — Encounter (INDEPENDENT_AMBULATORY_CARE_PROVIDER_SITE_OTHER): Payer: Medicare Other | Admitting: Ophthalmology

## 2019-04-23 ENCOUNTER — Ambulatory Visit (INDEPENDENT_AMBULATORY_CARE_PROVIDER_SITE_OTHER): Payer: Medicare Other

## 2019-04-23 DIAGNOSIS — N183 Chronic kidney disease, stage 3 unspecified: Secondary | ICD-10-CM

## 2019-04-23 DIAGNOSIS — E785 Hyperlipidemia, unspecified: Secondary | ICD-10-CM

## 2019-04-23 DIAGNOSIS — E1159 Type 2 diabetes mellitus with other circulatory complications: Secondary | ICD-10-CM | POA: Diagnosis not present

## 2019-04-23 NOTE — Chronic Care Management (AMB) (Signed)
  Chronic Care Management   Outreach Note  04/23/2019 Name: Carolyn Russo MRN: [id number] DOB: 18-Mar-1951  Referred by: [name], MD Reason for referral : Chronic Care Management (CCM RNCM Telephone Follow Up )   A second unsuccessful telephone outreach was attempted today. The patient was referred to the case management team for assistance with chronic care management and care coordination.   Follow Up Plan: The care management team will reach out to the patient again over the next 7-10 days.    Barb Merino, RN, BSN, CCM Care Management Coordinator [place] [facility]  Direct Phone: [phone]

## 2019-04-24 ENCOUNTER — Ambulatory Visit: Payer: Medicare Other | Admitting: Registered"

## 2019-04-25 ENCOUNTER — Ambulatory Visit: Payer: Medicare Other | Admitting: Registered"

## 2019-04-30 ENCOUNTER — Telehealth: Payer: Self-pay

## 2019-04-30 ENCOUNTER — Ambulatory Visit: Payer: Self-pay

## 2019-04-30 DIAGNOSIS — E785 Hyperlipidemia, unspecified: Secondary | ICD-10-CM

## 2019-04-30 DIAGNOSIS — E1159 Type 2 diabetes mellitus with other circulatory complications: Secondary | ICD-10-CM

## 2019-04-30 DIAGNOSIS — N183 Chronic kidney disease, stage 3 unspecified: Secondary | ICD-10-CM

## 2019-04-30 NOTE — Chronic Care Management (AMB) (Signed)
  Chronic Care Management   Outreach Note  04/30/2019 Name: Carolyn Russo MRN: [id number] DOB: 1950-11-09  Referred by: [name], MD Reason for referral : Chronic Care Management (CCM RNCM Telephone follow up )   A second unsuccessful telephone outreach was attempted today. The patient was referred to the case management team for assistance with chronic care management and care coordination.   Follow Up Plan: The care management team will reach out to the patient again over the next 7-14 days.   Barb Merino, RN, BSN, CCM Care Management Coordinator [place] [facility]  Direct Phone: [phone]

## 2019-05-01 ENCOUNTER — Ambulatory Visit: Payer: Medicare Other | Admitting: Podiatry

## 2019-05-09 ENCOUNTER — Encounter (INDEPENDENT_AMBULATORY_CARE_PROVIDER_SITE_OTHER): Payer: Medicare Other | Admitting: Ophthalmology

## 2019-05-13 ENCOUNTER — Encounter (INDEPENDENT_AMBULATORY_CARE_PROVIDER_SITE_OTHER): Payer: Medicare Other | Admitting: Ophthalmology

## 2019-05-13 ENCOUNTER — Other Ambulatory Visit: Payer: Self-pay

## 2019-05-13 DIAGNOSIS — H43813 Vitreous degeneration, bilateral: Secondary | ICD-10-CM | POA: Diagnosis not present

## 2019-05-13 DIAGNOSIS — H35033 Hypertensive retinopathy, bilateral: Secondary | ICD-10-CM | POA: Diagnosis not present

## 2019-05-13 DIAGNOSIS — I10 Essential (primary) hypertension: Secondary | ICD-10-CM

## 2019-05-13 DIAGNOSIS — E113511 Type 2 diabetes mellitus with proliferative diabetic retinopathy with macular edema, right eye: Secondary | ICD-10-CM | POA: Diagnosis not present

## 2019-05-13 DIAGNOSIS — E113592 Type 2 diabetes mellitus with proliferative diabetic retinopathy without macular edema, left eye: Secondary | ICD-10-CM | POA: Diagnosis not present

## 2019-05-13 DIAGNOSIS — E11311 Type 2 diabetes mellitus with unspecified diabetic retinopathy with macular edema: Secondary | ICD-10-CM

## 2019-05-14 ENCOUNTER — Encounter: Payer: Self-pay | Admitting: Podiatry

## 2019-05-14 ENCOUNTER — Ambulatory Visit (INDEPENDENT_AMBULATORY_CARE_PROVIDER_SITE_OTHER): Payer: Medicare Other | Admitting: Podiatry

## 2019-05-14 VITALS — Temp 98.5°F

## 2019-05-14 DIAGNOSIS — M79674 Pain in right toe(s): Secondary | ICD-10-CM

## 2019-05-14 DIAGNOSIS — B351 Tinea unguium: Secondary | ICD-10-CM | POA: Diagnosis not present

## 2019-05-14 DIAGNOSIS — M2010 Hallux valgus (acquired), unspecified foot: Secondary | ICD-10-CM

## 2019-05-14 DIAGNOSIS — E119 Type 2 diabetes mellitus without complications: Secondary | ICD-10-CM

## 2019-05-14 DIAGNOSIS — M79675 Pain in left toe(s): Secondary | ICD-10-CM | POA: Diagnosis not present

## 2019-05-14 NOTE — Progress Notes (Signed)
Complaint:  Visit Type: Patient returns to my office for continued preventative foot care services. Complaint: Patient states" my nails have grown long and thick and become painful to walk and wear shoes" Patient has been diagnosed with DM with no foot complications. The patient presents for preventative foot care services. No changes to ROS  Podiatric Exam: Vascular: dorsalis pedis and posterior tibial pulses are palpable bilateral. Capillary return is immediate. Temperature gradient is WNL. Skin turgor WNL  Sensorium: Normal Semmes Weinstein monofilament test. Normal tactile sensation bilaterally. Nail Exam: Pt has thick disfigured discolored nails with subungual debris noted bilateral entire nail hallux through fifth toenails Ulcer Exam: There is no evidence of ulcer or pre-ulcerative changes or infection. Orthopedic Exam: Muscle tone and strength are WNL. No limitations in general ROM. No crepitus or effusions noted. HAV  B/L.  Hammer toes  2  B/L. Skin: No Porokeratosis. No infection or ulcers  Diagnosis:  Onychomycosis, , Pain in right toe, pain in left toes  Treatment & Plan Procedures and Treatment: Consent by patient was obtained for treatment procedures.   Debridement of mycotic and hypertrophic toenails, 1 through 5 bilateral and clearing of subungual debris. No ulceration, no infection noted.  Return Visit-Office Procedure: Patient instructed to return to the office for a follow up visit 3 months for continued evaluation and treatment.    Gardiner Barefoot DPM

## 2019-05-15 ENCOUNTER — Telehealth: Payer: Self-pay

## 2019-05-16 ENCOUNTER — Ambulatory Visit (INDEPENDENT_AMBULATORY_CARE_PROVIDER_SITE_OTHER): Payer: Medicare Other

## 2019-05-16 DIAGNOSIS — E785 Hyperlipidemia, unspecified: Secondary | ICD-10-CM | POA: Diagnosis not present

## 2019-05-16 DIAGNOSIS — N183 Chronic kidney disease, stage 3 unspecified: Secondary | ICD-10-CM

## 2019-05-16 DIAGNOSIS — E1159 Type 2 diabetes mellitus with other circulatory complications: Secondary | ICD-10-CM | POA: Diagnosis not present

## 2019-05-16 NOTE — Chronic Care Management (AMB) (Signed)
  Chronic Care Management   Outreach Note  05/16/2019 Name: Carolyn Russo MRN: [id number] DOB: 01/09/1951  Referred by: [name], MD Reason for referral : Chronic Care Management (CCM RNCM Telephone Follow up)   Third unsuccessful telephone outreach was attempted today. The patient was referred to the case management team for assistance with chronic care management and care coordination. The patient's primary care provider has been notified of our unsuccessful attempts to make or maintain contact with the patient. The care management team is pleased to engage with this patient at any time in the future should he/she be interested in assistance from the care management team.   Follow Up Plan: The care management team is available to follow up with the patient after provider conversation with the patient regarding recommendation for care management engagement and subsequent re-referral to the care management team. If patient returns call to provider office, please advise to call Embedded RN Care Manager Glenard Haring Eleana Tocco at [place], RN, BSN, [place] Management Coordinator [place] [facility]  Direct Phone: [phone]

## 2019-05-24 DIAGNOSIS — E1165 Type 2 diabetes mellitus with hyperglycemia: Secondary | ICD-10-CM | POA: Diagnosis not present

## 2019-05-24 DIAGNOSIS — E11319 Type 2 diabetes mellitus with unspecified diabetic retinopathy without macular edema: Secondary | ICD-10-CM | POA: Diagnosis not present

## 2019-05-24 DIAGNOSIS — I10 Essential (primary) hypertension: Secondary | ICD-10-CM | POA: Diagnosis not present

## 2019-05-24 DIAGNOSIS — E7800 Pure hypercholesterolemia, unspecified: Secondary | ICD-10-CM | POA: Diagnosis not present

## 2019-05-24 LAB — HEPATIC FUNCTION PANEL
ALT: 25 (ref 7–35)
AST: 12 — AB (ref 13–35)
Alkaline Phosphatase: 115 (ref 25–125)

## 2019-05-24 LAB — LIPID PANEL
HDL: 42 (ref 35–70)
LDL Cholesterol: 107
LDl/HDL Ratio: 2.5
Triglycerides: 162 — AB (ref 40–160)

## 2019-06-11 ENCOUNTER — Encounter: Payer: Self-pay | Admitting: Internal Medicine

## 2019-06-13 ENCOUNTER — Telehealth: Payer: Self-pay

## 2019-06-13 NOTE — Telephone Encounter (Signed)
I left the pt a message that I was returning her call to schedule her a virtual appointment.  The pt left a message that when she "wets" herself when she sneezes and coughs.

## 2019-06-14 ENCOUNTER — Other Ambulatory Visit: Payer: Self-pay | Admitting: Internal Medicine

## 2019-06-14 DIAGNOSIS — Z1231 Encounter for screening mammogram for malignant neoplasm of breast: Secondary | ICD-10-CM

## 2019-06-17 ENCOUNTER — Encounter (INDEPENDENT_AMBULATORY_CARE_PROVIDER_SITE_OTHER): Payer: Medicare Other | Admitting: Ophthalmology

## 2019-06-17 ENCOUNTER — Other Ambulatory Visit: Payer: Self-pay

## 2019-06-17 DIAGNOSIS — I10 Essential (primary) hypertension: Secondary | ICD-10-CM | POA: Diagnosis not present

## 2019-06-17 DIAGNOSIS — E11311 Type 2 diabetes mellitus with unspecified diabetic retinopathy with macular edema: Secondary | ICD-10-CM

## 2019-06-17 DIAGNOSIS — E113511 Type 2 diabetes mellitus with proliferative diabetic retinopathy with macular edema, right eye: Secondary | ICD-10-CM

## 2019-06-17 DIAGNOSIS — H35033 Hypertensive retinopathy, bilateral: Secondary | ICD-10-CM

## 2019-06-17 DIAGNOSIS — H43813 Vitreous degeneration, bilateral: Secondary | ICD-10-CM

## 2019-06-17 DIAGNOSIS — E113592 Type 2 diabetes mellitus with proliferative diabetic retinopathy without macular edema, left eye: Secondary | ICD-10-CM | POA: Diagnosis not present

## 2019-06-18 ENCOUNTER — Encounter: Payer: Self-pay | Admitting: Nurse Practitioner

## 2019-06-18 ENCOUNTER — Ambulatory Visit (INDEPENDENT_AMBULATORY_CARE_PROVIDER_SITE_OTHER): Payer: Medicare Other | Admitting: Nurse Practitioner

## 2019-06-18 VITALS — Wt 178.0 lb

## 2019-06-18 DIAGNOSIS — R32 Unspecified urinary incontinence: Secondary | ICD-10-CM

## 2019-06-18 DIAGNOSIS — Z794 Long term (current) use of insulin: Secondary | ICD-10-CM

## 2019-06-18 LAB — POCT URINALYSIS DIPSTICK
Bilirubin, UA: NEGATIVE
Glucose, UA: POSITIVE — AB
Ketones, UA: NEGATIVE
Leukocytes, UA: NEGATIVE
Nitrite, UA: NEGATIVE
Protein, UA: POSITIVE — AB
Spec Grav, UA: 1.025 (ref 1.010–1.025)
Urobilinogen, UA: 0.2 E.U./dL
pH, UA: 5.5 (ref 5.0–8.0)

## 2019-06-18 MED ORDER — OXYBUTYNIN CHLORIDE 5 MG PO TABS: 2.5000 mg | ORAL_TABLET | Freq: Two times a day (BID) | ORAL | 1 refills | Status: DC

## 2019-06-18 NOTE — Progress Notes (Signed)
Virtual Visit via Telephone   This visit type was conducted due to national recommendations for restrictions regarding the COVID-19 Pandemic (e.g. social distancing) in an effort to limit this patient's exposure and mitigate transmission in our community.  Due to her co-morbid illnesses, this patient is at least at moderate risk for complications without adequate follow up.  This format is felt to be most appropriate for this patient at this time.  All issues noted in this document were discussed and addressed.  A limited physical exam was performed with this format.    This visit type was conducted due to national recommendations for restrictions regarding the COVID-19 Pandemic (e.g. social distancing) in an effort to limit this patient's exposure and mitigate transmission in our community.  Patients identity confirmed using two different identifiers.  This format is felt to be most appropriate for this patient at this time.  All issues noted in this document were discussed and addressed.  No physical exam was performed (except for noted visual exam findings with Video Visits).    Date:  06/23/2019   ID:  Carolyn Russo, DOB 1951-06-25, MRN [id number]  Patient Location:  Home - spoke with T[place]  Provider location:   Office    Chief Complaint:  Urinary Frequency  History of Present Illness:    Carolyn Russo is a 68 y.o. female who presents via video conferencing for a telehealth visit today.    The patient does have symptoms concerning for COVID-19 infection cough which the patient feels is related to her allergies   Her telephone visit today is because she has been having an increase in urinary frequency with incontinence episodes.    She tells me her insulin makes her go frequently aware.  When she went to [place] last month blood sugar levels were not so good.  Varies between 26- 200's (depends on whether she eats late). She is due to see Dr. Chalmers Cater on August 31st.  Last HgbA1c  near 12. She is wearing depends because she never knows when she has to go.    Urinary Tract Infection  This is a new problem. The current episode started more than 1 month ago (worse in last month). The problem occurs intermittently. There has been no fever. She is not sexually active. There is no history of pyelonephritis. Associated symptoms include frequency. Pertinent negatives include no chills or flank pain. Associated symptoms comments: When sneezing she will have . She has tried nothing for the symptoms. There is no history of recurrent UTIs.     Past Medical History:  Diagnosis Date  . Allergy   . Anterior chest wall pain   . Appendicitis 1965  . Asthma   . Body mass index 37.0-37.9, adult   . Breast pain   . Cataract    both eyes  . Chronic renal disease, stage II   . Dehydration 2014  . Deviated septum 1971  . Diabetes mellitus   . Dyspnea 2014  . Extrinsic asthma    WITH ASTHMA ATTACK  . Fibroid 1980  . GERD (gastroesophageal reflux disease)   . Heart murmur   . Hx gestational diabetes   . Hyperlipidemia   . Hypertension 2014  . Inguinal hernia 1959  . Malaise and fatigue 2014  . Nephropathy   . Non-IgE mediated allergic asthma 2014  . Obesity   . Pelvic pain   . Pregnancy, high-risk 1985  . Tonsillitis 1968  . Uterine fibroid 1980   Past  Surgical History:  Procedure Laterality Date  . APPENDECTOMY  1959  . CESAREAN SECTION  1985  . COLONOSCOPY    . EYE SURGERY     bilateral cataract   . HERNIA REPAIR  1959  . LEFT HEART CATH AND CORONARY ANGIOGRAPHY N/A 04/04/2017   Procedure: Left Heart Cath and Coronary Angiography;  Surgeon: Belva Crome, MD;  Location: [facility];  Service: Cardiovascular;  Laterality: N/A;  . [place], 2004, 2007  . RHINOPLASTY  1971  . ROTATOR CUFF REPAIR  2003  . SURGICAL REPAIR OF HEMORRHAGE  2015  . TONSILLECTOMY  1968     Current Meds  Medication Sig  . albuterol (PROVENTIL HFA;VENTOLIN HFA) 108 (90  BASE) MCG/ACT inhaler Inhale 2 puffs into the lungs every 4 (four) hours as needed for wheezing or shortness of breath.  Marland Kitchen aspirin 81 MG tablet Take 81 mg by mouth daily.  Marland Kitchen b complex vitamins capsule Take 1 capsule by mouth daily.  Marland Kitchen BESIVANCE 0.6 % SUSP INSTILL ONE GTT IN RIGHT EYE QID FOR 2 DAYS AFTER EACH MONTHLY EYE INJECTION  . budesonide-formoterol (SYMBICORT) 160-4.5 MCG/ACT inhaler Inhale 2 puffs into the lungs 2 (two) times daily.  . Cholecalciferol (VITAMIN D3 PO) Take 2,000 Int'l Units by mouth daily.  . CVS CINNAMON PO Take 250 mg by mouth 2 (two) times daily.   . fluticasone (FLONASE) 50 MCG/ACT nasal spray Place 2 sprays into the nose daily as needed for allergies.   . furosemide (LASIX) 40 MG tablet furosemide 40 mg tablet  . glucose blood (ACCU-CHEK GUIDE) test strip Use as instructed to check blood sugars 2 times per day dx: e11.65  . insulin aspart protamine - aspart (NOVOLOG MIX 70/30 FLEXPEN) (70-30) 100 UNIT/ML FlexPen 10 Units 2 (two) times daily with a meal.   . lactobacillus acidophilus (BACID) TABS Take 1 tablet by mouth daily. PROBIOTIC  . magnesium oxide (MAG-OX) 400 (241.3 Mg) MG tablet Take 1 tablet by mouth daily.  . milk thistle 175 MG tablet Take 525 mg by mouth daily. Takes 3 daily  . Polyvinyl Alcohol-Povidone (REFRESH OP) Apply to eye.  . rosuvastatin (CRESTOR) 5 MG tablet TAKE 1 TABLET(5 MG) BY MOUTH DAILY  . valACYclovir (VALTREX) 1000 MG tablet valacyclovir 1 gram tablet  . valsartan-hydrochlorothiazide (DIOVAN-HCT) 320-12.5 MG tablet Take 1 tablet by mouth daily. Please make overdue annual appt with Dr. Tamala Julian for future refills. Thank you     Allergies:   Food, Other, Wheat bran, Statins, Iodine, Shellfish allergy, Tetracycline, Tetracyclines & related, and Contrast media [iodinated diagnostic agents]   Social History   Tobacco Use  . Smoking status: Never Smoker  . Smokeless tobacco: Never Used  Substance Use Topics  . Alcohol use: No  . Drug use:  No     Family Hx: The patient's family history includes Alzheimer's disease in her father; Cancer in her cousin and mother; Diabetes in her cousin and maternal aunt; Psoriasis in her mother. There is no history of Colon cancer, Colon polyps, Esophageal cancer, Rectal cancer, Stomach cancer, or Breast cancer.  ROS:   Please see the history of present illness.    Review of Systems  Constitutional: Negative for chills.  Respiratory: Negative.   Cardiovascular: Negative.   Genitourinary: Positive for frequency. Negative for flank pain.       She is having urinary incontinence when she coughs and at bedtime. Reports has been ongoing for several months however has worsened in the last week  Neurological: Negative for dizziness and tingling.  Psychiatric/Behavioral: Negative.  Negative for depression. The patient is not nervous/anxious.     All other systems reviewed and are negative.   Labs/Other Tests and Data Reviewed:    Recent Labs: 03/27/2019: ALT 31; BUN 39; Creatinine, Ser 1.88; Potassium 4.1; Sodium 140   Recent Lipid Panel Lab Results  Component Value Date/Time   CHOL 132 03/27/2019 02:36 PM   TRIG 229 (H) 03/27/2019 02:36 PM   HDL 37 (L) 03/27/2019 02:36 PM   CHOLHDL 3.6 03/27/2019 02:36 PM   CHOLHDL 4.8 10/13/2011 09:03 AM   LDLCALC 49 03/27/2019 02:36 PM    Wt Readings from Last 3 Encounters:  06/18/19 178 lb (80.7 kg)  03/27/19 178 lb 3.2 oz (80.8 kg)  03/27/19 178 lb (80.7 kg)     Exam:    Vital Signs:  Wt 178 lb (80.7 kg)   BMI 32.56 kg/m     Physical Exam  Constitutional: She is oriented to person, place, and time and well-developed, well-nourished, and in no distress. No distress.  Neurological: She is alert and oriented to person, place, and time.  Psychiatric: Mood, memory, affect and judgment normal.    ASSESSMENT & PLAN:     1. Urinary incontinence, unspecified type   Urinalysis is positive for glucose and protein, I will send urine culture   Will start on low dose oxybutynin follow up 4 weeks  Encouraged to be sure to stay well hydrated with water - POCT Urinalysis Dipstick (61224)    COVID-19 Education: The signs and symptoms of COVID-19 were discussed with the patient and how to seek care for testing (follow up with PCP or arrange E-visit).  The importance of social distancing was discussed today.  Patient Risk:   After full review of this patients clinical status, I feel that they are at least moderate risk at this time.  Time:   Today, I have spent 11 minutes/ seconds with the patient with telehealth technology discussing above diagnoses.     Medication Adjustments/Labs and Tests Ordered: Current medicines are reviewed at length with the patient today.  Concerns regarding medicines are outlined above.   Tests Ordered: Orders Placed This Encounter  Procedures  . Culture, Urine  . POCT Urinalysis Dipstick (81002)    Medication Changes: Meds ordered this encounter  Medications  . oxybutynin (DITROPAN) 5 MG tablet    Sig: Take 0.5 tablets (2.5 mg total) by mouth 2 (two) times daily.    Dispense:  60 tablet    Refill:  1    Disposition:  Follow up in 4 month(s)  Signed, Minette Brine, FNP

## 2019-06-19 LAB — URINE CULTURE

## 2019-06-23 ENCOUNTER — Encounter: Payer: Self-pay | Admitting: Nurse Practitioner

## 2019-07-10 ENCOUNTER — Telehealth: Payer: Self-pay | Admitting: Interventional Cardiology

## 2019-07-10 MED ORDER — VALSARTAN-HYDROCHLOROTHIAZIDE 320-12.5 MG PO TABS: 1.0000 | ORAL_TABLET | Freq: Every day | ORAL | 2 refills | Status: DC

## 2019-07-10 NOTE — Telephone Encounter (Signed)
Pt's medication was sent to pt's pharmacy as requested. Confirmation received.  °

## 2019-07-10 NOTE — Telephone Encounter (Signed)
° °*  STAT* If patient is at the pharmacy, call can be transferred to refill team.   1. Which medications need to be refilled? (please list name of each medication and dose if known)   valsartan-hydrochlorothiazide (DIOVAN-HCT) 320-12.5 MG tablet  2. Which pharmacy/location (including street and city if local pharmacy) is medication to be sent to?  [facility] [place], [place] AT [address]  3. Do they need a 30 day or 90 day supply? 30 with refills   Patient has an appt scheduled to see Dr. Tamala Julian 11/18. She will need medication to get her until that appt

## 2019-07-16 ENCOUNTER — Ambulatory Visit (INDEPENDENT_AMBULATORY_CARE_PROVIDER_SITE_OTHER): Payer: Medicare Other

## 2019-07-16 ENCOUNTER — Other Ambulatory Visit: Payer: Self-pay

## 2019-07-16 ENCOUNTER — Ambulatory Visit: Payer: Medicare Other | Admitting: Nurse Practitioner

## 2019-07-16 VITALS — BP 122/84 | HR 75 | Temp 98.4°F

## 2019-07-16 DIAGNOSIS — Z23 Encounter for immunization: Secondary | ICD-10-CM

## 2019-07-16 NOTE — Progress Notes (Signed)
Pt presented for flu shot today

## 2019-07-17 DIAGNOSIS — E1122 Type 2 diabetes mellitus with diabetic chronic kidney disease: Secondary | ICD-10-CM | POA: Diagnosis not present

## 2019-07-17 DIAGNOSIS — E1165 Type 2 diabetes mellitus with hyperglycemia: Secondary | ICD-10-CM | POA: Diagnosis not present

## 2019-07-17 DIAGNOSIS — N3281 Overactive bladder: Secondary | ICD-10-CM | POA: Diagnosis not present

## 2019-07-17 DIAGNOSIS — E669 Obesity, unspecified: Secondary | ICD-10-CM | POA: Diagnosis not present

## 2019-07-17 DIAGNOSIS — E8770 Fluid overload, unspecified: Secondary | ICD-10-CM | POA: Diagnosis not present

## 2019-07-17 DIAGNOSIS — I129 Hypertensive chronic kidney disease with stage 1 through stage 4 chronic kidney disease, or unspecified chronic kidney disease: Secondary | ICD-10-CM | POA: Diagnosis not present

## 2019-07-17 DIAGNOSIS — N183 Chronic kidney disease, stage 3 (moderate): Secondary | ICD-10-CM | POA: Diagnosis not present

## 2019-07-22 ENCOUNTER — Encounter (INDEPENDENT_AMBULATORY_CARE_PROVIDER_SITE_OTHER): Payer: Medicare Other | Admitting: Ophthalmology

## 2019-07-23 DIAGNOSIS — I10 Essential (primary) hypertension: Secondary | ICD-10-CM | POA: Diagnosis not present

## 2019-07-23 DIAGNOSIS — E11319 Type 2 diabetes mellitus with unspecified diabetic retinopathy without macular edema: Secondary | ICD-10-CM | POA: Diagnosis not present

## 2019-07-23 DIAGNOSIS — E1165 Type 2 diabetes mellitus with hyperglycemia: Secondary | ICD-10-CM | POA: Diagnosis not present

## 2019-07-23 DIAGNOSIS — E7800 Pure hypercholesterolemia, unspecified: Secondary | ICD-10-CM | POA: Diagnosis not present

## 2019-07-23 DIAGNOSIS — N189 Chronic kidney disease, unspecified: Secondary | ICD-10-CM | POA: Diagnosis not present

## 2019-07-29 ENCOUNTER — Ambulatory Visit
Admission: RE | Admit: 2019-07-29 | Discharge: 2019-07-29 | Disposition: A | Discharge status: Home / Self Care | Payer: Medicare Other | Source: Ambulatory Visit | Attending: Internal Medicine | Admitting: Internal Medicine

## 2019-07-29 ENCOUNTER — Other Ambulatory Visit: Payer: Self-pay

## 2019-07-29 DIAGNOSIS — Z1231 Encounter for screening mammogram for malignant neoplasm of breast: Secondary | ICD-10-CM

## 2019-07-29 IMAGING — MG MM DIGITAL SCREENING BILAT W/ TOMO W/ CAD
6 of 12 series · 6 of 36 positions shown · non-contrast
Comparison: Previous exam(s).

CLINICAL DATA: Screening.

EXAM:
DIGITAL SCREENING BILATERAL MAMMOGRAM WITH TOMO AND CAD

[L CV synth-2D]
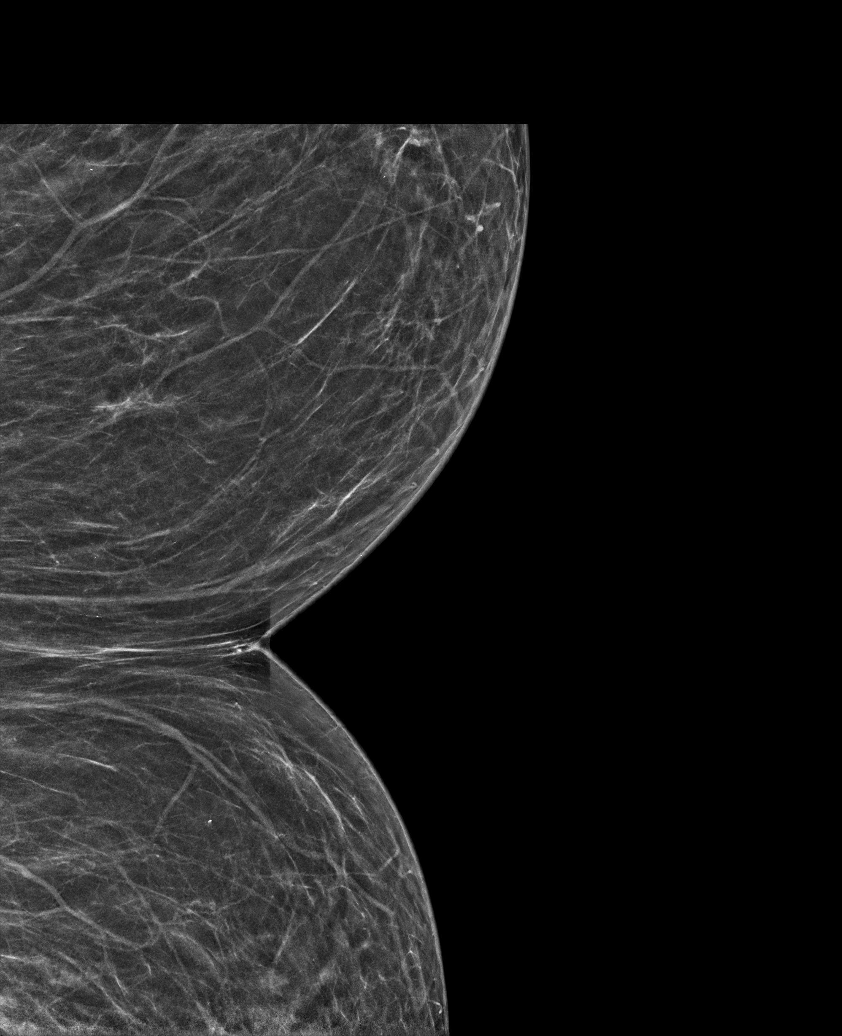

[L MLO synth-2D (1 of 2)]
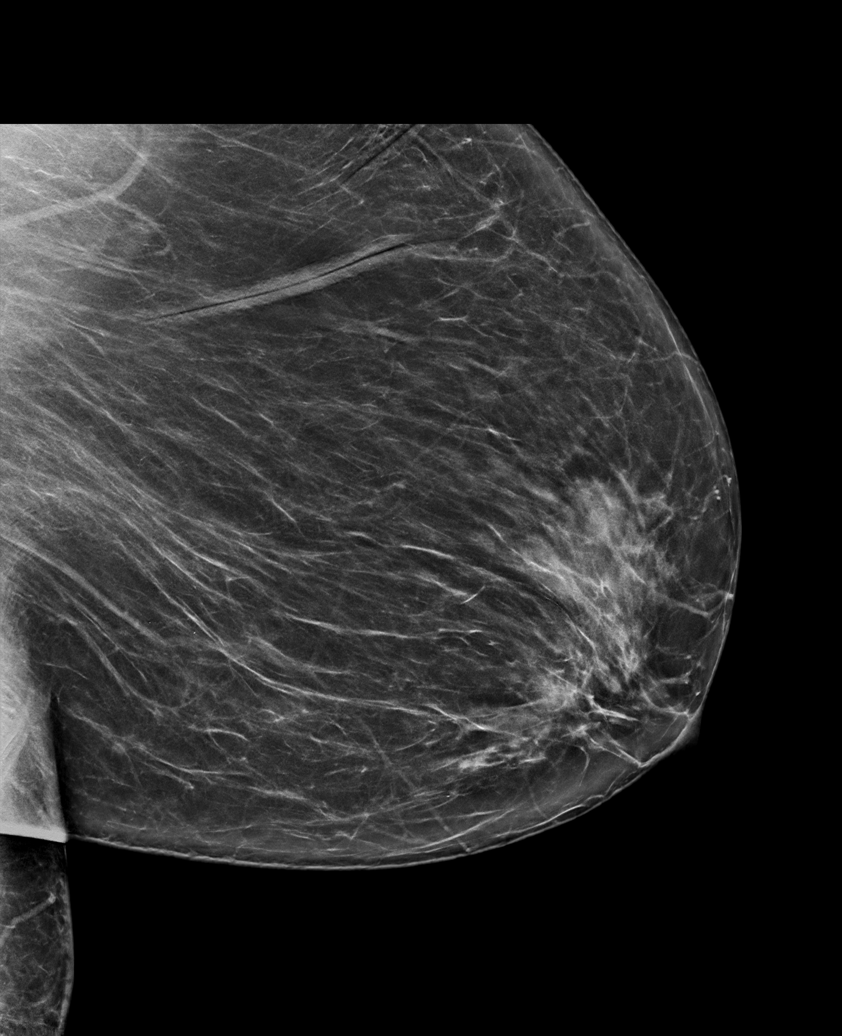

[R CC synth-2D]
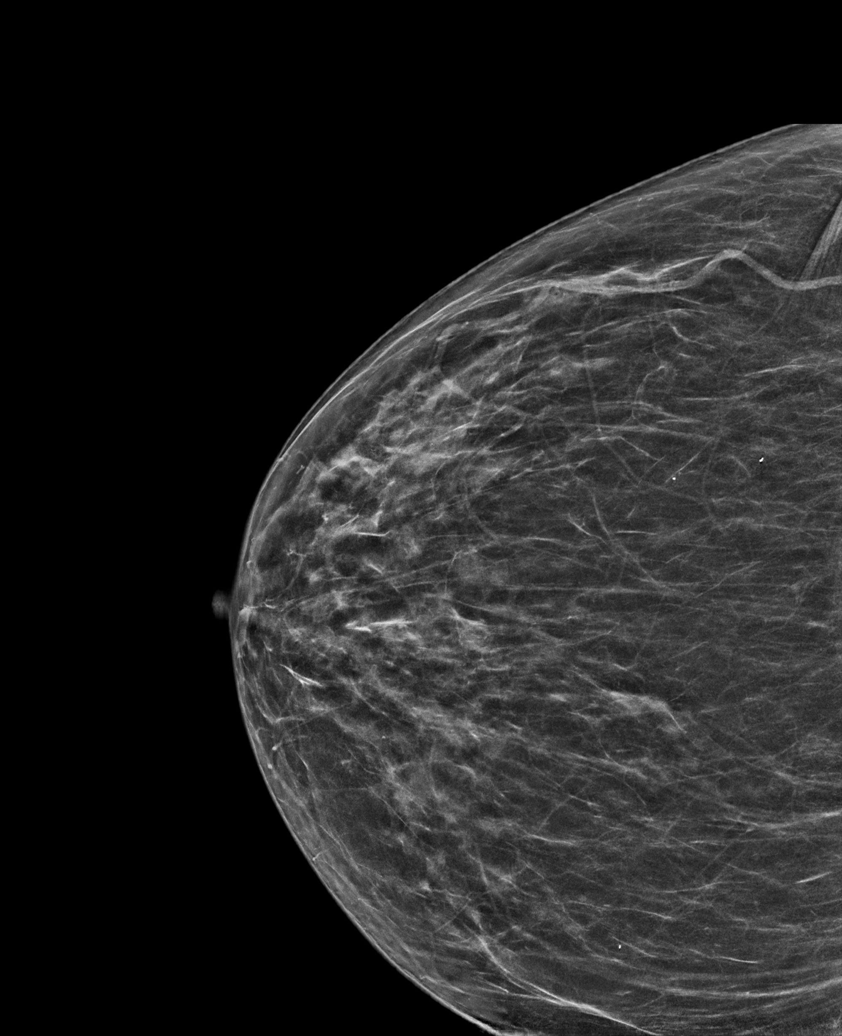

[R MLO synth-2D]
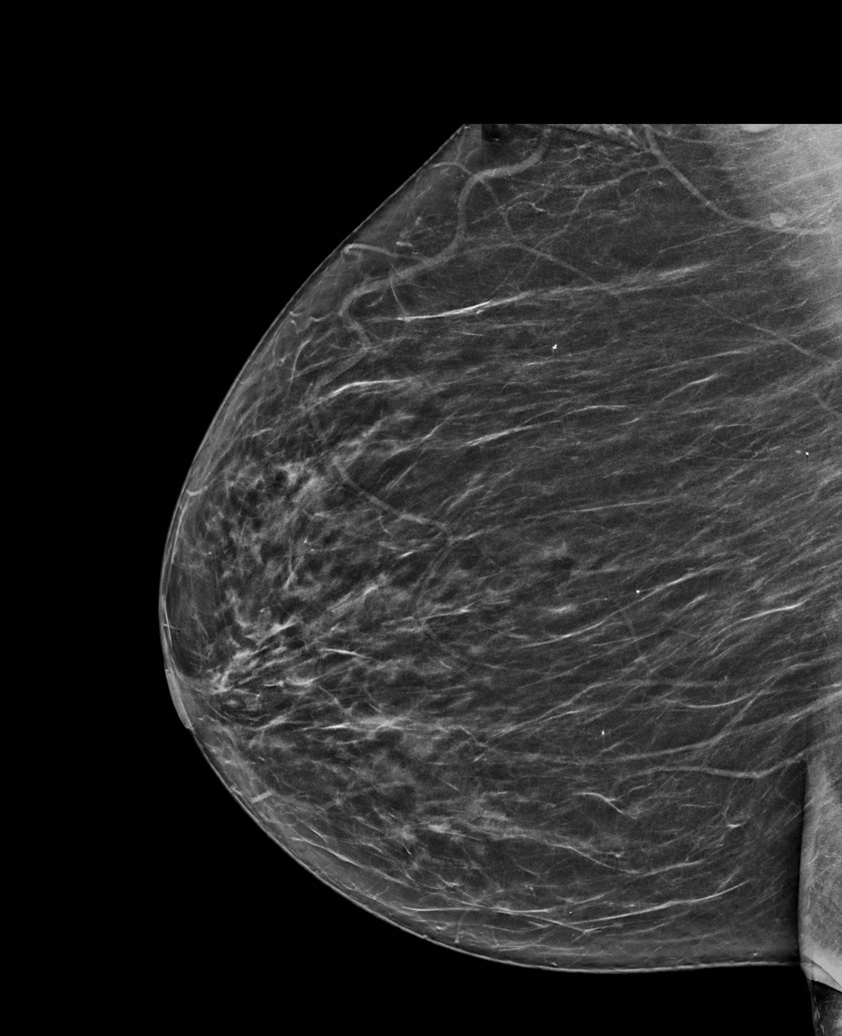

[L MLO synth-2D (2 of 2)]
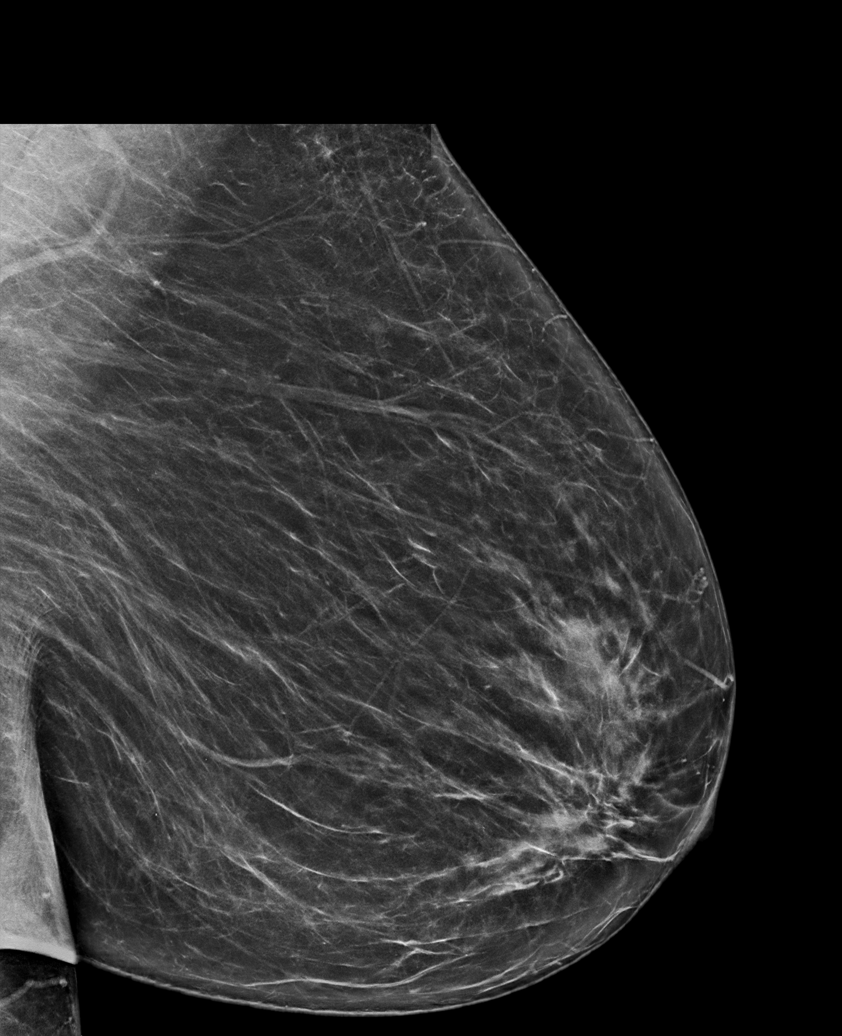

[L CC synth-2D]
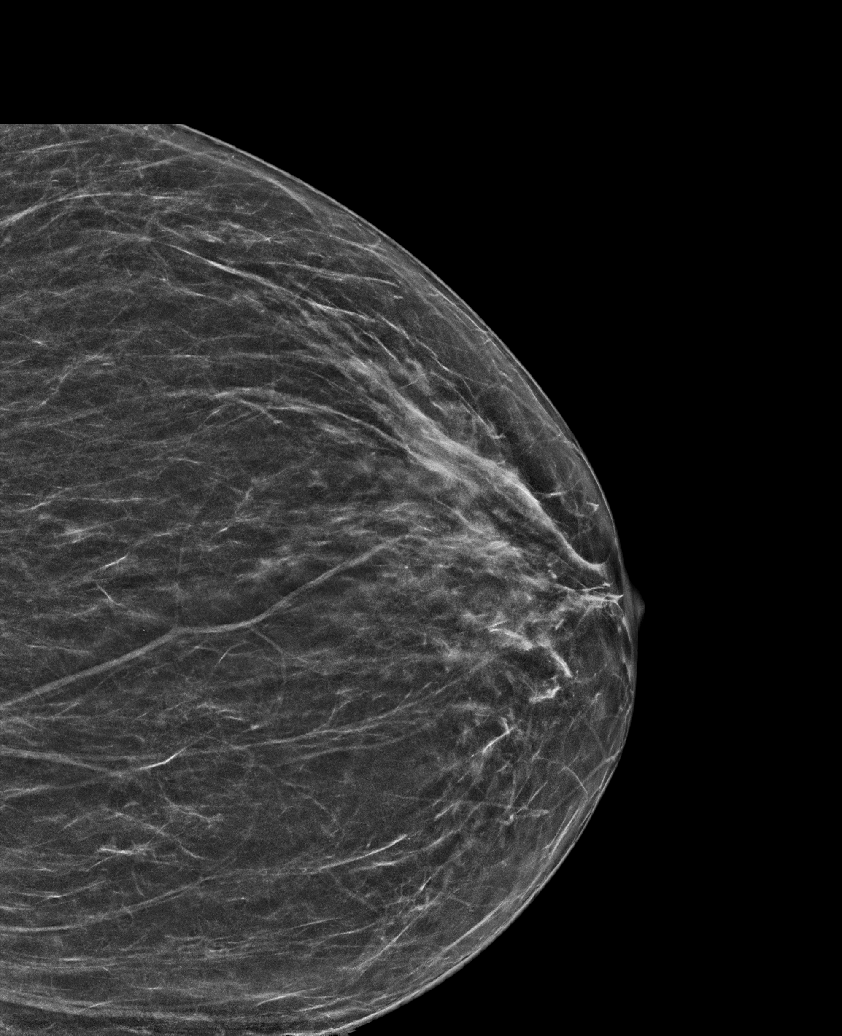

[6 of 36 positions shown; findings below may reference images not displayed]

ACR Breast Density Category b: There are scattered areas of
fibroglandular density.
FINDINGS: There are no findings suspicious for malignancy. Images were
processed with CAD.
IMPRESSION: No mammographic evidence of malignancy. A result letter of this
screening mammogram will be mailed directly to the patient.

RECOMMENDATION:
Screening mammogram in one year. (Code:[TQ])

BI-RADS CATEGORY  1: Negative.

## 2019-07-30 ENCOUNTER — Encounter (INDEPENDENT_AMBULATORY_CARE_PROVIDER_SITE_OTHER): Payer: Medicare Other | Admitting: Ophthalmology

## 2019-07-30 ENCOUNTER — Other Ambulatory Visit: Payer: Self-pay

## 2019-07-30 DIAGNOSIS — H43813 Vitreous degeneration, bilateral: Secondary | ICD-10-CM

## 2019-07-30 DIAGNOSIS — E113513 Type 2 diabetes mellitus with proliferative diabetic retinopathy with macular edema, bilateral: Secondary | ICD-10-CM

## 2019-07-30 DIAGNOSIS — I10 Essential (primary) hypertension: Secondary | ICD-10-CM | POA: Diagnosis not present

## 2019-07-30 DIAGNOSIS — H35033 Hypertensive retinopathy, bilateral: Secondary | ICD-10-CM | POA: Diagnosis not present

## 2019-07-30 DIAGNOSIS — E11311 Type 2 diabetes mellitus with unspecified diabetic retinopathy with macular edema: Secondary | ICD-10-CM | POA: Diagnosis not present

## 2019-08-20 ENCOUNTER — Other Ambulatory Visit: Payer: Self-pay

## 2019-08-20 ENCOUNTER — Ambulatory Visit: Payer: Medicare Other | Admitting: Podiatry

## 2019-08-20 MED ORDER — FUROSEMIDE 40 MG PO TABS: ORAL_TABLET | ORAL | 1 refills | Status: DC

## 2019-09-09 ENCOUNTER — Encounter (INDEPENDENT_AMBULATORY_CARE_PROVIDER_SITE_OTHER): Payer: Medicare Other | Admitting: Ophthalmology

## 2019-09-09 DIAGNOSIS — H35033 Hypertensive retinopathy, bilateral: Secondary | ICD-10-CM | POA: Diagnosis not present

## 2019-09-09 DIAGNOSIS — E113511 Type 2 diabetes mellitus with proliferative diabetic retinopathy with macular edema, right eye: Secondary | ICD-10-CM | POA: Diagnosis not present

## 2019-09-09 DIAGNOSIS — H43813 Vitreous degeneration, bilateral: Secondary | ICD-10-CM

## 2019-09-09 DIAGNOSIS — E11311 Type 2 diabetes mellitus with unspecified diabetic retinopathy with macular edema: Secondary | ICD-10-CM | POA: Diagnosis not present

## 2019-09-09 DIAGNOSIS — I10 Essential (primary) hypertension: Secondary | ICD-10-CM

## 2019-09-09 DIAGNOSIS — E113592 Type 2 diabetes mellitus with proliferative diabetic retinopathy without macular edema, left eye: Secondary | ICD-10-CM

## 2019-09-10 NOTE — Progress Notes (Signed)
Cardiology Office Note:    Date:  09/11/2019   ID:  ZAKIA SAINATO, DOB December 02, 1950, MRN [id number]  PCP:  [name], MD  Cardiologist:  Sinclair Grooms, MD   Referring MD: [name], MD   Chief Complaint  Patient presents with  . Hypertension  . Hyperlipidemia  . Diabetes Mellitus    History of Present Illness:    Gene MANIKA HAST is a 68 y.o. female with a hx of diabetes mellitus 2, hypertension, CKD stage III-IV hyperlipidemia, and nonobstructive coronary disease (cardiac catheterization in 2018).   Dr. [name] is here for follow-up of nonobstructive coronary disease and significant cardiovascular comorbidities including those outlined above.  She has no specific cardiac complaints at this time.  She denies orthopnea, PND, palpitations, syncope, and lower extremity edema.  She has had no neurological complaints.  She has no medication side effects.  She has not taken Diovan HCT for at least 2 days.  She also states that she has not had furosemide this morning.  Past Medical History:  Diagnosis Date  . Allergy   . Anterior chest wall pain   . Appendicitis 1965  . Asthma   . Body mass index 37.0-37.9, adult   . Breast pain   . Cataract    both eyes  . Chronic renal disease, stage II   . Dehydration 2014  . Deviated septum 1971  . Diabetes mellitus   . Dyspnea 2014  . Extrinsic asthma    WITH ASTHMA ATTACK  . Fibroid 1980  . GERD (gastroesophageal reflux disease)   . Heart murmur   . Hx gestational diabetes   . Hyperlipidemia   . Hypertension 2014  . Inguinal hernia 1959  . Malaise and fatigue 2014  . Nephropathy   . Non-IgE mediated allergic asthma 2014  . Obesity   . Pelvic pain   . Pregnancy, high-risk 1985  . Tonsillitis 1968  . Uterine fibroid 1980    Past Surgical History:  Procedure Laterality Date  . APPENDECTOMY  1959  . CESAREAN SECTION  1985  . COLONOSCOPY    . EYE SURGERY     bilateral cataract   . HERNIA REPAIR  1959  . LEFT  HEART CATH AND CORONARY ANGIOGRAPHY N/A 04/04/2017   Procedure: Left Heart Cath and Coronary Angiography;  Surgeon: Belva Crome, MD;  Location: [facility];  Service: Cardiovascular;  Laterality: N/A;  . [place], 2004, 2007  . RHINOPLASTY  1971  . ROTATOR CUFF REPAIR  2003  . SURGICAL REPAIR OF HEMORRHAGE  2015  . TONSILLECTOMY  1968    Current Medications: Current Meds  Medication Sig  . albuterol (PROVENTIL HFA;VENTOLIN HFA) 108 (90 BASE) MCG/ACT inhaler Inhale 2 puffs into the lungs every 4 (four) hours as needed for wheezing or shortness of breath.  Marland Kitchen aspirin 81 MG tablet Take 81 mg by mouth daily.  Marland Kitchen b complex vitamins capsule Take 1 capsule by mouth daily.  Marland Kitchen BESIVANCE 0.6 % SUSP INSTILL ONE GTT IN RIGHT EYE QID FOR 2 DAYS AFTER EACH MONTHLY EYE INJECTION  . budesonide-formoterol (SYMBICORT) 160-4.5 MCG/ACT inhaler Inhale 2 puffs into the lungs 2 (two) times daily.  . Cholecalciferol (VITAMIN D3 PO) Take 2,000 Int'l Units by mouth daily.  . CVS CINNAMON PO Take 250 mg by mouth 2 (two) times daily.   . fluticasone (FLONASE) 50 MCG/ACT nasal spray Place 2 sprays into the nose daily as needed for allergies.   . furosemide (LASIX)  40 MG tablet Take 1 To 2 tablets by mouth daily  . glucose blood (ACCU-CHEK GUIDE) test strip Use as instructed to check blood sugars 2 times per day dx: e11.65  . insulin aspart protamine - aspart (NOVOLOG MIX 70/30 FLEXPEN) (70-30) 100 UNIT/ML FlexPen 10 Units 2 (two) times daily with a meal.   . lactobacillus acidophilus (BACID) TABS Take 1 tablet by mouth daily. PROBIOTIC  . magnesium oxide (MAG-OX) 400 (241.3 Mg) MG tablet Take 1 tablet by mouth daily.  . milk thistle 175 MG tablet Take 525 mg by mouth daily. Takes 3 daily  . oxybutynin (DITROPAN) 5 MG tablet Take 0.5 tablets (2.5 mg total) by mouth 2 (two) times daily.  . Polyvinyl Alcohol-Povidone (REFRESH OP) Apply to eye.  . rosuvastatin (CRESTOR) 5 MG tablet TAKE 1 TABLET(5 MG) BY  MOUTH DAILY  . valACYclovir (VALTREX) 1000 MG tablet valacyclovir 1 gram tablet  . valsartan-hydrochlorothiazide (DIOVAN-HCT) 320-12.5 MG tablet Take 1 tablet by mouth daily.  . [DISCONTINUED] valsartan-hydrochlorothiazide (DIOVAN-HCT) 320-12.5 MG tablet Take 1 tablet by mouth daily. Please keep upcoming appt in November with Dr. Tamala Julian before anymore refills. Thank you     Allergies:   Food, Other, Wheat bran, Statins, Iodine, Shellfish allergy, Tetracycline, Tetracyclines & related, and Contrast media [iodinated diagnostic agents]   Social History   Socioeconomic History  . Marital status: Married    Spouse name: Not on file  . Number of children: 1  . Years of education: Not on file  . Highest education level: Professional school degree (e.g., MD, DDS, DVM, JD)  Occupational History  . Occupation: retired  Scientific laboratory technician  . Financial resource strain: Somewhat hard  . Food insecurity    Worry: Never true    Inability: Never true  . Transportation needs    Medical: No    Non-medical: No  Tobacco Use  . Smoking status: Never Smoker  . Smokeless tobacco: Never Used  Substance and Sexual Activity  . Alcohol use: No  . Drug use: No  . Sexual activity: Yes    Birth control/protection: Post-menopausal  Lifestyle  . Physical activity    Days per week: 0 days    Minutes per session: 0 min  . Stress: Not at all  Relationships  . Social connections    Talks on phone: More than three times a week    Gets together: More than three times a week    Attends religious service: More than 4 times per year    Active member of club or organization: Yes    Attends meetings of clubs or organizations: More than 4 times per year    Relationship status: Married  Other Topics Concern  . Not on file  Social History Narrative   Patient reports she used to walk at [employer] but now due to Covid-19 she is staying inside.     Family History: The patient's family history includes Alzheimer's disease  in her father; Cancer in her cousin and mother; Diabetes in her cousin and maternal aunt; Psoriasis in her mother. There is no history of Colon cancer, Colon polyps, Esophageal cancer, Rectal cancer, Stomach cancer, or Breast cancer.  ROS:   Please see the history of present illness.    She is starting to use supplements to help control risk factors.  We did not go into detail.  Dr. Michiel Sites is not her endocrinologist.  Dr. Bryon Lions is the primary care.  Dr. Johnney Ou is the nephrology physician.  The last hemoglobin  A1c was greater than 12 in March 2020.  All other systems reviewed and are negative.  EKGs/Labs/Other Studies Reviewed:    The following studies were reviewed today: No new or recent cardiac imaging  EKG:  EKG normal sinus rhythm with poor R wave progression V1 through V5.  This tracing is unchanged when compared to the prior study from 2018.  Recent Labs: 03/27/2019: ALT 31; BUN 39; Creatinine, Ser 1.88; Potassium 4.1; Sodium 140  Recent Lipid Panel    Component Value Date/Time   CHOL 132 03/27/2019 1436   TRIG 229 (H) 03/27/2019 1436   HDL 37 (L) 03/27/2019 1436   CHOLHDL 3.6 03/27/2019 1436   CHOLHDL 4.8 10/13/2011 0903   VLDL 28 10/13/2011 0903   LDLCALC 49 03/27/2019 1436    Physical Exam:    VS:  BP (!) 164/82   Pulse 79   Ht 5\' 2"  (1.575 m)   Wt 186 lb 12.8 oz (84.7 kg)   SpO2 98%   BMI 34.17 kg/m     Wt Readings from Last 3 Encounters:  09/11/19 186 lb 12.8 oz (84.7 kg)  06/18/19 178 lb (80.7 kg)  03/27/19 178 lb 3.2 oz (80.8 kg)     GEN.  Moderate obesity. No acute distress HEENT: Normal NECK: No JVD. LYMPHATICS: No lymphadenopathy CARDIAC:  RRR without murmur, gallop, or edema. VASCULAR: Decreased pulses in both feet.. No bruits. RESPIRATORY:  Clear to auscultation without rales, wheezing or rhonchi  ABDOMEN: Soft, non-tender, non-distended, No pulsatile mass, MUSCULOSKELETAL: No deformity  SKIN: Warm and dry NEUROLOGIC:  Alert and oriented x  3 PSYCHIATRIC:  Normal affect   ASSESSMENT:    1. Coronary artery disease involving native coronary artery of native heart without angina pectoris   2. Hypertensive kidney disease with stage 3 chronic kidney disease, unspecified whether stage 3a or 3b CKD   3. Uncontrolled type 2 diabetes mellitus with hyperglycemia ([place])   4. Dyslipidemia (high LDL; low HDL)   5. Diminished pulses in lower extremity   6. Educated about COVID-19 virus infection    PLAN:    In order of problems listed above:  1. Secondary risk reduction was discussed in detail.  We will falling short on multiple metrics including physical activity, A1c, blood pressure...  Need to do better. 2. Poor blood pressure recording today may be related to medication compliance (no furosemide yet and off Diovan HCT for several days).  I have asked her to record blood pressures at home at least 2 hours after her a.m. medicines over the next 2 weeks and report back to [place].  Target 130/80 mmHg.  Kidney function is being followed by Dr. Johnney Ou 3. A1c needs to be less than 7.  Followed by Dr. Soyla Murphy 4. LDL is at target, 62 5. May have PAD.  This will need to be screened. 6. The 3W's is endorsed and practiced.  Overall education and awareness concerning primary/secondary risk prevention was discussed in detail: LDL less than 70, hemoglobin A1c less than 7, blood pressure target less than 130/80 mmHg, >150 minutes of moderate aerobic activity per week, avoidance of smoking, weight control (via diet and exercise), and continued surveillance/management of/for obstructive sleep apnea.    Medication Adjustments/Labs and Tests Ordered: Current medicines are reviewed at length with the patient today.  Concerns regarding medicines are outlined above.  Orders Placed This Encounter  Procedures  . EKG 12-Lead   Meds ordered this encounter  Medications  . valsartan-hydrochlorothiazide (DIOVAN-HCT) 320-12.5 MG tablet  Sig: Take 1 tablet by  mouth daily.    Dispense:  90 tablet    Refill:  3    Patient Instructions  Medication Instructions:  Your physician recommends that you continue on your current medications as directed. Please refer to the Current Medication list given to you today.  *If you need a refill on your cardiac medications before your next appointment, please call your pharmacy*  Lab Work: None If you have labs (blood work) drawn today and your tests are completely normal, you will receive your results only by: Marland Kitchen MyChart Message (if you have MyChart) OR . A paper copy in the mail If you have any lab test that is abnormal or we need to change your treatment, we will call you to review the results.  Testing/Procedures: None  Follow-Up: At [facility], you and your health needs are our priority.  As part of our continuing mission to provide you with exceptional heart care, we have created designated Provider Care Teams.  These Care Teams include your primary Cardiologist (physician) and Advanced Practice Providers (APPs -  Physician Assistants and Nurse Practitioners) who all work together to provide you with the care you need, when you need it.  Your next appointment:   12 month(s)  The format for your next appointment:   In Person  Provider:   You may see Sinclair Grooms, MD or one of the following Advanced Practice Providers on your designated Care Team:    Truitt Merle, NP  Cecilie Kicks, NP  Kathyrn Drown, NP   Other Instructions  Goals for your blood pressure and blood work are below:  LDL (bad) cholesterol should be 70 or lower Hemoglobin A1C should be 7 or less Blood Pressure should be 130/80 or less     Signed, Sinclair Grooms, MD  09/11/2019 11:57 AM    [place]

## 2019-09-11 ENCOUNTER — Other Ambulatory Visit: Payer: Self-pay

## 2019-09-11 ENCOUNTER — Ambulatory Visit (INDEPENDENT_AMBULATORY_CARE_PROVIDER_SITE_OTHER): Payer: Medicare Other | Admitting: Interventional Cardiology

## 2019-09-11 ENCOUNTER — Encounter: Payer: Self-pay | Admitting: Interventional Cardiology

## 2019-09-11 VITALS — BP 164/82 | HR 79 | Ht 62.0 in | Wt 186.8 lb

## 2019-09-11 DIAGNOSIS — I2510 Atherosclerotic heart disease of native coronary artery without angina pectoris: Secondary | ICD-10-CM | POA: Diagnosis not present

## 2019-09-11 DIAGNOSIS — R0989 Other specified symptoms and signs involving the circulatory and respiratory systems: Secondary | ICD-10-CM

## 2019-09-11 DIAGNOSIS — N183 Chronic kidney disease, stage 3 unspecified: Secondary | ICD-10-CM

## 2019-09-11 DIAGNOSIS — Z7189 Other specified counseling: Secondary | ICD-10-CM

## 2019-09-11 DIAGNOSIS — I129 Hypertensive chronic kidney disease with stage 1 through stage 4 chronic kidney disease, or unspecified chronic kidney disease: Secondary | ICD-10-CM

## 2019-09-11 DIAGNOSIS — E1165 Type 2 diabetes mellitus with hyperglycemia: Secondary | ICD-10-CM

## 2019-09-11 DIAGNOSIS — E785 Hyperlipidemia, unspecified: Secondary | ICD-10-CM

## 2019-09-11 MED ORDER — VALSARTAN-HYDROCHLOROTHIAZIDE 320-12.5 MG PO TABS: 1.0000 | ORAL_TABLET | Freq: Every day | ORAL | 3 refills | Status: DC

## 2019-09-11 NOTE — Patient Instructions (Signed)
Medication Instructions:  Your physician recommends that you continue on your current medications as directed. Please refer to the Current Medication list given to you today.  *If you need a refill on your cardiac medications before your next appointment, please call your pharmacy*  Lab Work: None If you have labs (blood work) drawn today and your tests are completely normal, you will receive your results only by: Marland Kitchen MyChart Message (if you have MyChart) OR . A paper copy in the mail If you have any lab test that is abnormal or we need to change your treatment, we will call you to review the results.  Testing/Procedures: None  Follow-Up: At [facility], you and your health needs are our priority.  As part of our continuing mission to provide you with exceptional heart care, we have created designated Provider Care Teams.  These Care Teams include your primary Cardiologist (physician) and Advanced Practice Providers (APPs -  Physician Assistants and Nurse Practitioners) who all work together to provide you with the care you need, when you need it.  Your next appointment:   12 month(s)  The format for your next appointment:   In Person  Provider:   You may see Sinclair Grooms, MD or one of the following Advanced Practice Providers on your designated Care Team:    Truitt Merle, NP  Cecilie Kicks, NP  Kathyrn Drown, NP   Other Instructions  Goals for your blood pressure and blood work are below:  LDL (bad) cholesterol should be 70 or lower Hemoglobin A1C should be 7 or less Blood Pressure should be 130/80 or less

## 2019-10-04 ENCOUNTER — Ambulatory Visit (INDEPENDENT_AMBULATORY_CARE_PROVIDER_SITE_OTHER): Payer: Medicare Other | Admitting: Podiatry

## 2019-10-04 ENCOUNTER — Other Ambulatory Visit: Payer: Self-pay

## 2019-10-04 ENCOUNTER — Encounter: Payer: Self-pay | Admitting: Podiatry

## 2019-10-04 DIAGNOSIS — M79674 Pain in right toe(s): Secondary | ICD-10-CM | POA: Diagnosis not present

## 2019-10-04 DIAGNOSIS — M79675 Pain in left toe(s): Secondary | ICD-10-CM | POA: Diagnosis not present

## 2019-10-04 DIAGNOSIS — B351 Tinea unguium: Secondary | ICD-10-CM

## 2019-10-04 DIAGNOSIS — E119 Type 2 diabetes mellitus without complications: Secondary | ICD-10-CM | POA: Diagnosis not present

## 2019-10-04 NOTE — Progress Notes (Signed)
Complaint:  Visit Type: Patient returns to my office for continued preventative foot care services. Complaint: Patient states" my nails have grown long and thick and become painful to walk and wear shoes" Patient has been diagnosed with DM with no foot complications. The patient presents for preventative foot care services. No changes to ROS  Podiatric Exam: Vascular: dorsalis pedis and posterior tibial pulses are palpable bilateral. Capillary return is immediate. Temperature gradient is WNL. Skin turgor WNL  Sensorium: Normal Semmes Weinstein monofilament test. Normal tactile sensation bilaterally. Nail Exam: Pt has thick disfigured discolored nails with subungual debris noted bilateral entire nail hallux through fifth toenails Ulcer Exam: There is no evidence of ulcer or pre-ulcerative changes or infection. Orthopedic Exam: Muscle tone and strength are WNL. No limitations in general ROM. No crepitus or effusions noted. HAV  B/L.  Hammer toes  2  B/L. Skin: No Porokeratosis. No infection or ulcers  Diagnosis:  Onychomycosis, , Pain in right toe, pain in left toes  Treatment & Plan Procedures and Treatment: Consent by patient was obtained for treatment procedures.   Debridement of mycotic and hypertrophic toenails, 1 through 5 bilateral and clearing of subungual debris. No ulceration, no infection noted.  Return Visit-Office Procedure: Patient instructed to return to the office for a follow up visit 3 months for continued evaluation and treatment.    Chanie Soucek DPM 

## 2019-10-14 ENCOUNTER — Encounter (INDEPENDENT_AMBULATORY_CARE_PROVIDER_SITE_OTHER): Payer: Medicare Other | Admitting: Ophthalmology

## 2019-10-14 DIAGNOSIS — E113513 Type 2 diabetes mellitus with proliferative diabetic retinopathy with macular edema, bilateral: Secondary | ICD-10-CM | POA: Diagnosis not present

## 2019-10-14 DIAGNOSIS — I10 Essential (primary) hypertension: Secondary | ICD-10-CM

## 2019-10-14 DIAGNOSIS — H43813 Vitreous degeneration, bilateral: Secondary | ICD-10-CM

## 2019-10-14 DIAGNOSIS — E11311 Type 2 diabetes mellitus with unspecified diabetic retinopathy with macular edema: Secondary | ICD-10-CM

## 2019-10-14 DIAGNOSIS — H35033 Hypertensive retinopathy, bilateral: Secondary | ICD-10-CM | POA: Diagnosis not present

## 2019-10-15 ENCOUNTER — Encounter (INDEPENDENT_AMBULATORY_CARE_PROVIDER_SITE_OTHER): Payer: Medicare Other | Admitting: Ophthalmology

## 2019-10-16 ENCOUNTER — Other Ambulatory Visit: Payer: Self-pay

## 2019-10-16 ENCOUNTER — Ambulatory Visit (INDEPENDENT_AMBULATORY_CARE_PROVIDER_SITE_OTHER): Payer: Medicare Other | Admitting: Internal Medicine

## 2019-10-16 ENCOUNTER — Encounter: Payer: Self-pay | Admitting: Internal Medicine

## 2019-10-16 VITALS — BP 118/70 | HR 73 | Temp 97.9°F | Ht 62.0 in | Wt 192.8 lb

## 2019-10-16 DIAGNOSIS — E1159 Type 2 diabetes mellitus with other circulatory complications: Secondary | ICD-10-CM

## 2019-10-16 DIAGNOSIS — Z23 Encounter for immunization: Secondary | ICD-10-CM

## 2019-10-16 DIAGNOSIS — I1310 Hypertensive heart and chronic kidney disease without heart failure, with stage 1 through stage 4 chronic kidney disease, or unspecified chronic kidney disease: Secondary | ICD-10-CM

## 2019-10-16 DIAGNOSIS — I2510 Atherosclerotic heart disease of native coronary artery without angina pectoris: Secondary | ICD-10-CM | POA: Diagnosis not present

## 2019-10-16 DIAGNOSIS — N183 Chronic kidney disease, stage 3 unspecified: Secondary | ICD-10-CM | POA: Diagnosis not present

## 2019-10-16 DIAGNOSIS — Z6835 Body mass index (BMI) 35.0-35.9, adult: Secondary | ICD-10-CM

## 2019-10-16 NOTE — Patient Instructions (Signed)
Diabetes Mellitus and Foot Care Foot care is an important part of your health, especially when you have diabetes. Diabetes may cause you to have problems because of poor blood flow (circulation) to your feet and legs, which can cause your skin to:  Become thinner and drier.  Break more easily.  Heal more slowly.  Peel and crack. You may also have nerve damage (neuropathy) in your legs and feet, causing decreased feeling in them. This means that you may not notice minor injuries to your feet that could lead to more serious problems. Noticing and addressing any potential problems early is the best way to prevent future foot problems. How to care for your feet Foot hygiene  Wash your feet daily with warm water and mild soap. Do not use hot water. Then, pat your feet and the areas between your toes until they are completely dry. Do not soak your feet as this can dry your skin.  Trim your toenails straight across. Do not dig under them or around the cuticle. File the edges of your nails with an emery board or nail file.  Apply a moisturizing lotion or petroleum jelly to the skin on your feet and to dry, brittle toenails. Use lotion that does not contain alcohol and is unscented. Do not apply lotion between your toes. Shoes and socks  Wear clean socks or stockings every day. Make sure they are not too tight. Do not wear knee-high stockings since they may decrease blood flow to your legs.  Wear shoes that fit properly and have enough cushioning. Always look in your shoes before you put them on to be sure there are no objects inside.  To break in new shoes, wear them for just a few hours a day. This prevents injuries on your feet. Wounds, scrapes, corns, and calluses  Check your feet daily for blisters, cuts, bruises, sores, and redness. If you cannot see the bottom of your feet, use a mirror or ask someone for help.  Do not cut corns or calluses or try to remove them with medicine.  If you  find a minor scrape, cut, or break in the skin on your feet, keep it and the skin around it clean and dry. You may clean these areas with mild soap and water. Do not clean the area with peroxide, alcohol, or iodine.  If you have a wound, scrape, corn, or callus on your foot, look at it several times a day to make sure it is healing and not infected. Check for: ? Redness, swelling, or pain. ? Fluid or blood. ? Warmth. ? Pus or a bad smell. General instructions  Do not cross your legs. This may decrease blood flow to your feet.  Do not use heating pads or hot water bottles on your feet. They may burn your skin. If you have lost feeling in your feet or legs, you may not know this is happening until it is too late.  Protect your feet from hot and cold by wearing shoes, such as at the beach or on hot pavement.  Schedule a complete foot exam at least once a year (annually) or more often if you have foot problems. If you have foot problems, report any cuts, sores, or bruises to your health care provider immediately. Contact a health care provider if:  You have a medical condition that increases your risk of infection and you have any cuts, sores, or bruises on your feet.  You have an injury that is not   healing.  You have redness on your legs or feet.  You feel burning or tingling in your legs or feet.  You have pain or cramps in your legs and feet.  Your legs or feet are numb.  Your feet always feel cold.  You have pain around a toenail. Get help right away if:  You have a wound, scrape, corn, or callus on your foot and: ? You have pain, swelling, or redness that gets worse. ? You have fluid or blood coming from the wound, scrape, corn, or callus. ? Your wound, scrape, corn, or callus feels warm to the touch. ? You have pus or a bad smell coming from the wound, scrape, corn, or callus. ? You have a fever. ? You have a red line going up your leg. Summary  Check your feet every day  for cuts, sores, red spots, swelling, and blisters.  Moisturize feet and legs daily.  Wear shoes that fit properly and have enough cushioning.  If you have foot problems, report any cuts, sores, or bruises to your health care provider immediately.  Schedule a complete foot exam at least once a year (annually) or more often if you have foot problems. This information is not intended to replace advice given to you by your health care provider. Make sure you discuss any questions you have with your health care provider. Document Released: 10/07/2000 Document Revised: 11/22/2017 Document Reviewed: 11/11/2016 Elsevier Patient Education  2020 [employer]  

## 2019-10-17 LAB — LIPID PANEL
Chol/HDL Ratio: 5 ratio — ABNORMAL HIGH (ref 0.0–4.4)
Cholesterol, Total: 184 mg/dL (ref 100–199)
HDL: 37 mg/dL — ABNORMAL LOW (ref >39)
LDL Chol Calc (NIH): 111 mg/dL — ABNORMAL HIGH (ref 0–99)
Triglycerides: 205 mg/dL — ABNORMAL HIGH (ref 0–149)
VLDL Cholesterol Cal: 36 mg/dL (ref 5–40)

## 2019-10-17 LAB — HEMOGLOBIN A1C
Est. average glucose Bld gHb Est-mCnc: 275 mg/dL
Hgb A1c MFr Bld: 11.2 % — ABNORMAL HIGH (ref 4.8–5.6)

## 2019-10-20 MED ORDER — TETANUS-DIPHTH-ACELL PERTUSSIS 5-2.5-18.5 LF-MCG/0.5 IM SUSP: 0.5000 mL | Freq: Once | INTRAMUSCULAR | 0 refills | Status: AC

## 2019-10-20 NOTE — Progress Notes (Signed)
This visit occurred during the SARS-CoV-2 public health emergency.  Safety protocols were in place, including screening questions prior to the visit, additional usage of staff PPE, and extensive cleaning of exam room while observing appropriate contact time as indicated for disinfecting solutions.  Subjective:     Patient ID: Carolyn Russo , female    DOB: [birth year] , 68 y.o.   MRN: [id number]   Chief Complaint  Patient presents with  . Immunizations    Tdap  . Hypertension    HPI  She is here today for a blood pressure check. She reports compliance with meds. She is also followed by Cardiology. She has no specific concerns or complaints at this time. She is a little fatigued today, she has been "running" all morning. Denies chest pain and shortness of breath.   Hypertension This is a chronic problem. The current episode started more than 1 year ago. The problem has been gradually improving since onset. The problem is controlled. Pertinent negatives include no blurred vision, chest pain, palpitations or shortness of breath. Past treatments include angiotensin blockers and diuretics. The current treatment provides moderate improvement. Compliance problems include exercise.  Hypertensive end-organ damage includes kidney disease and CAD/MI.     Past Medical History:  Diagnosis Date  . Allergy   . Anterior chest wall pain   . Appendicitis 1965  . Asthma   . Body mass index 37.0-37.9, adult   . Breast pain   . Cataract    both eyes  . Chronic renal disease, stage II   . Dehydration 2014  . Deviated septum 1971  . Diabetes mellitus   . Dyspnea 2014  . Extrinsic asthma    WITH ASTHMA ATTACK  . Fibroid 1980  . GERD (gastroesophageal reflux disease)   . Heart murmur   . Hx gestational diabetes   . Hyperlipidemia   . Hypertension 2014  . Inguinal hernia 1959  . Malaise and fatigue 2014  . Nephropathy   . Non-IgE mediated allergic asthma 2014  . Obesity   . Pelvic pain   .  Pregnancy, high-risk 1985  . Tonsillitis 1968  . Uterine fibroid 1980     Family History  Problem Relation Age of Onset  . Cancer Mother        abdominal melamona  . Psoriasis Mother   . Alzheimer's disease Father   . Cancer Cousin        colon   . Diabetes Cousin   . Diabetes Maternal Aunt   . Colon cancer Neg Hx   . Colon polyps Neg Hx   . Esophageal cancer Neg Hx   . Rectal cancer Neg Hx   . Stomach cancer Neg Hx   . Breast cancer Neg Hx      Current Outpatient Medications:  .  albuterol (PROVENTIL HFA;VENTOLIN HFA) 108 (90 BASE) MCG/ACT inhaler, Inhale 2 puffs into the lungs every 4 (four) hours as needed for wheezing or shortness of breath., Disp: 1 Inhaler, Rfl: 3 .  aspirin 81 MG tablet, Take 81 mg by mouth daily., Disp: , Rfl:  .  b complex vitamins capsule, Take 1 capsule by mouth daily., Disp: , Rfl:  .  BESIVANCE 0.6 % SUSP, INSTILL ONE GTT IN RIGHT EYE QID FOR 2 DAYS AFTER EACH MONTHLY EYE INJECTION, Disp: , Rfl:  .  budesonide-formoterol (SYMBICORT) 160-4.5 MCG/ACT inhaler, Inhale 2 puffs into the lungs 2 (two) times daily., Disp: 1 Inhaler, Rfl: 5 .  Cholecalciferol (VITAMIN D3 PO), Take  2,000 Int'l Units by mouth daily., Disp: , Rfl:  .  [employer] CINNAMON PO, Take 250 mg by mouth 2 (two) times daily. , Disp: , Rfl:  .  fluticasone (FLONASE) 50 MCG/ACT nasal spray, Place 2 sprays into the nose daily as needed for allergies. , Disp: , Rfl:  .  furosemide (LASIX) 40 MG tablet, Take 1 To 2 tablets by mouth daily (Patient taking differently: 1 tab every other day), Disp: 60 tablet, Rfl: 1 .  glucose blood (ACCU-CHEK GUIDE) test strip, Use as instructed to check blood sugars 2 times per day dx: e11.65, Disp: 100 each, Rfl: 11 .  insulin aspart protamine - aspart (NOVOLOG MIX 70/30 FLEXPEN) (70-30) 100 UNIT/ML FlexPen, 10 Units 2 (two) times daily with a meal. , Disp: , Rfl:  .  lactobacillus acidophilus (BACID) TABS, Take 1 tablet by mouth daily. PROBIOTIC, Disp: , Rfl:  .   magnesium oxide (MAG-OX) 400 (241.3 Mg) MG tablet, Take 1 tablet by mouth daily., Disp: , Rfl:  .  milk thistle 175 MG tablet, Take 525 mg by mouth daily. Takes 3 daily, Disp: , Rfl:  .  Polyvinyl Alcohol-Povidone (REFRESH OP), Apply to eye., Disp: , Rfl:  .  rosuvastatin (CRESTOR) 5 MG tablet, TAKE 1 TABLET(5 MG) BY MOUTH DAILY, Disp: 90 tablet, Rfl: 0 .  valACYclovir (VALTREX) 1000 MG tablet, valacyclovir 1 gram tablet, Disp: , Rfl:  .  valsartan-hydrochlorothiazide (DIOVAN-HCT) 320-12.5 MG tablet, Take 1 tablet by mouth daily., Disp: 90 tablet, Rfl: 3 .  oxybutynin (DITROPAN) 5 MG tablet, Take 0.5 tablets (2.5 mg total) by mouth 2 (two) times daily. (Patient not taking: Reported on 10/16/2019), Disp: 60 tablet, Rfl: 1 .  Tdap (BOOSTRIX) 5-2.5-18.5 LF-MCG/0.5 injection, Inject 0.5 mLs into the muscle once for 1 dose., Disp: 0.5 mL, Rfl: 0   Allergies  Allergen Reactions  . Food Anaphylaxis    Peanuts; Almonds  . Other Shortness Of Breath    Peanuts; Almonds  . Wheat Bran Shortness Of Breath  . Statins Itching and Other (See Comments)    Generalized aches  . Iodine Other (See Comments)  . Shellfish Allergy Other (See Comments)    Mouth gets raw  . Tetracycline Other (See Comments)    Raw mouth  . Tetracyclines & Related Itching  . Contrast Media [Iodinated Diagnostic Agents] Rash    Happened during CT scan over 30 years ago     Review of Systems  Constitutional: Negative.   Eyes: Negative for blurred vision.  Respiratory: Negative.  Negative for shortness of breath.   Cardiovascular: Negative.  Negative for chest pain and palpitations.  Gastrointestinal: Negative.   Neurological: Negative.   Psychiatric/Behavioral: Negative.      Today's Vitals   10/16/19 1202  BP: 118/70  Pulse: 73  Temp: 97.9 F (36.6 C)  TempSrc: Oral  Weight: 192 lb 12.8 oz (87.5 kg)  Height: 5\' 2"  (1.575 m)  PainSc: 0-No pain   Body mass index is 35.26 kg/m.   Objective:  Physical  Exam Vitals and nursing note reviewed.  Constitutional:      Appearance: Normal appearance.  HENT:     Head: Normocephalic and atraumatic.  Cardiovascular:     Rate and Rhythm: Normal rate and regular rhythm.     Heart sounds: Normal heart sounds.  Pulmonary:     Effort: Pulmonary effort is normal.     Breath sounds: Normal breath sounds.  Skin:    General: Skin is warm.  Neurological:  General: No focal deficit present.     Mental Status: She is alert.  Psychiatric:        Mood and Affect: Mood normal.        Behavior: Behavior normal.         Assessment And Plan:     1. Hypertensive heart and renal disease with renal failure, stage 1 through stage 4 or unspecified chronic kidney disease, without heart failure  Chronic, well controlled. She will continue with current meds. She is encouraged to take meds as prescribed and follow a low-salt diet.   2. Stage 3 chronic kidney disease, unspecified whether stage 3a or 3b CKD  Chronic, yet stable. She is now followed by Nephrology.   3. Type 2 diabetes mellitus with other circulatory complication, without long-term current use of insulin (HCC)  Chronic. I will check labs as listed below. I will also request her recent progress notes from Dr. Chalmers Cater, her endocrinologist.   - Lipid panel - Hemoglobin A1c  4. Class 2 severe obesity due to excess calories with serious comorbidity and body mass index (BMI) of 35.0 to 35.9 in adult [facility])  She is encouraged to strive for BMI less than 30 to decrease cardiac risk.   5. Immunization due  Rx Tdap (Boostrix) was sent to her pharmacy. She is encouraged to notify me once this is administered so her chart can be updated accordingly.      Maximino Greenland, MD    THE PATIENT IS ENCOURAGED TO PRACTICE SOCIAL DISTANCING DUE TO THE COVID-19 PANDEMIC.

## 2019-11-06 DIAGNOSIS — E1165 Type 2 diabetes mellitus with hyperglycemia: Secondary | ICD-10-CM | POA: Diagnosis not present

## 2019-11-06 DIAGNOSIS — I129 Hypertensive chronic kidney disease with stage 1 through stage 4 chronic kidney disease, or unspecified chronic kidney disease: Secondary | ICD-10-CM | POA: Diagnosis not present

## 2019-11-06 DIAGNOSIS — E1122 Type 2 diabetes mellitus with diabetic chronic kidney disease: Secondary | ICD-10-CM | POA: Diagnosis not present

## 2019-11-06 DIAGNOSIS — N3281 Overactive bladder: Secondary | ICD-10-CM | POA: Diagnosis not present

## 2019-11-06 DIAGNOSIS — E8770 Fluid overload, unspecified: Secondary | ICD-10-CM | POA: Diagnosis not present

## 2019-11-06 DIAGNOSIS — N183 Chronic kidney disease, stage 3 unspecified: Secondary | ICD-10-CM | POA: Diagnosis not present

## 2019-11-06 DIAGNOSIS — E669 Obesity, unspecified: Secondary | ICD-10-CM | POA: Diagnosis not present

## 2019-11-12 DIAGNOSIS — E1165 Type 2 diabetes mellitus with hyperglycemia: Secondary | ICD-10-CM | POA: Diagnosis not present

## 2019-11-12 DIAGNOSIS — I10 Essential (primary) hypertension: Secondary | ICD-10-CM | POA: Diagnosis not present

## 2019-11-12 DIAGNOSIS — E7800 Pure hypercholesterolemia, unspecified: Secondary | ICD-10-CM | POA: Diagnosis not present

## 2019-11-12 LAB — LIPID PANEL
HDL: 41 (ref 35–70)
LDL Cholesterol: 82
LDl/HDL Ratio: 2
Triglycerides: 122 (ref 40–160)

## 2019-11-12 LAB — HEPATIC FUNCTION PANEL
ALT: 29 (ref 7–35)
AST: 14 (ref 13–35)
Alkaline Phosphatase: 80 (ref 25–125)

## 2019-11-12 LAB — BASIC METABOLIC PANEL
BUN: 38 — AB (ref 4–21)
CO2: 24 — AB (ref 13–22)
Chloride: 105 (ref 99–108)
Creatinine: 2.4 — AB (ref 0.5–1.1)
Glucose: 165
Potassium: 4.6 (ref 3.4–5.3)
Sodium: 139 (ref 137–147)

## 2019-11-12 LAB — HEMOGLOBIN A1C: Hemoglobin A1C: 102

## 2019-11-12 LAB — COMPREHENSIVE METABOLIC PANEL: Calcium: 9.3 (ref 8.7–10.7)

## 2019-11-18 ENCOUNTER — Encounter (INDEPENDENT_AMBULATORY_CARE_PROVIDER_SITE_OTHER): Payer: Medicare Other | Admitting: Ophthalmology

## 2019-11-18 ENCOUNTER — Other Ambulatory Visit: Payer: Self-pay

## 2019-11-18 DIAGNOSIS — H43813 Vitreous degeneration, bilateral: Secondary | ICD-10-CM

## 2019-11-18 DIAGNOSIS — E11319 Type 2 diabetes mellitus with unspecified diabetic retinopathy without macular edema: Secondary | ICD-10-CM | POA: Diagnosis not present

## 2019-11-18 DIAGNOSIS — E113593 Type 2 diabetes mellitus with proliferative diabetic retinopathy without macular edema, bilateral: Secondary | ICD-10-CM | POA: Diagnosis not present

## 2019-11-18 DIAGNOSIS — H35033 Hypertensive retinopathy, bilateral: Secondary | ICD-10-CM

## 2019-11-18 DIAGNOSIS — I10 Essential (primary) hypertension: Secondary | ICD-10-CM

## 2019-11-18 LAB — HM DIABETES EYE EXAM

## 2019-11-19 DIAGNOSIS — E7800 Pure hypercholesterolemia, unspecified: Secondary | ICD-10-CM | POA: Diagnosis not present

## 2019-11-19 DIAGNOSIS — E11319 Type 2 diabetes mellitus with unspecified diabetic retinopathy without macular edema: Secondary | ICD-10-CM | POA: Diagnosis not present

## 2019-11-19 DIAGNOSIS — I10 Essential (primary) hypertension: Secondary | ICD-10-CM | POA: Diagnosis not present

## 2019-11-19 DIAGNOSIS — N189 Chronic kidney disease, unspecified: Secondary | ICD-10-CM | POA: Diagnosis not present

## 2019-11-19 DIAGNOSIS — E1165 Type 2 diabetes mellitus with hyperglycemia: Secondary | ICD-10-CM | POA: Diagnosis not present

## 2019-11-20 ENCOUNTER — Encounter: Payer: Self-pay | Admitting: Internal Medicine

## 2019-12-14 ENCOUNTER — Ambulatory Visit: Payer: Medicare Other | Attending: Internal Medicine

## 2019-12-14 DIAGNOSIS — Z23 Encounter for immunization: Secondary | ICD-10-CM | POA: Insufficient documentation

## 2019-12-14 NOTE — Progress Notes (Signed)
   Covid-19 Vaccination Clinic  Name:  SUZANA SOHAIL    MRN: [id number] DOB: 06-16-1951  12/14/2019  Ms. Taff was observed post Covid-19 immunization for 15 minutes without incidence. She was provided with Vaccine Information Sheet and instruction to access the V-Safe system.   Ms. Schwimmer was instructed to call 911 with any severe reactions post vaccine: Marland Kitchen Difficulty breathing  . Swelling of your face and throat  . A fast heartbeat  . A bad rash all over your body  . Dizziness and weakness    Immunizations Administered    Name Date Dose VIS Date Route   Pfizer COVID-19 Vaccine 12/14/2019  3:42 PM 0.3 mL 10/04/2019 Intramuscular   Manufacturer: [place]   Lot: [id number]   [place]: [address]-4

## 2019-12-23 ENCOUNTER — Other Ambulatory Visit: Payer: Self-pay | Admitting: Internal Medicine

## 2019-12-23 ENCOUNTER — Encounter (INDEPENDENT_AMBULATORY_CARE_PROVIDER_SITE_OTHER): Payer: Medicare Other | Admitting: Ophthalmology

## 2019-12-23 ENCOUNTER — Other Ambulatory Visit: Payer: Self-pay

## 2019-12-23 DIAGNOSIS — I10 Essential (primary) hypertension: Secondary | ICD-10-CM

## 2019-12-23 DIAGNOSIS — H43813 Vitreous degeneration, bilateral: Secondary | ICD-10-CM | POA: Diagnosis not present

## 2019-12-23 DIAGNOSIS — E113513 Type 2 diabetes mellitus with proliferative diabetic retinopathy with macular edema, bilateral: Secondary | ICD-10-CM | POA: Diagnosis not present

## 2019-12-23 DIAGNOSIS — H35033 Hypertensive retinopathy, bilateral: Secondary | ICD-10-CM

## 2019-12-23 DIAGNOSIS — E11311 Type 2 diabetes mellitus with unspecified diabetic retinopathy with macular edema: Secondary | ICD-10-CM

## 2019-12-23 MED ORDER — ONDANSETRON HCL 4 MG PO TABS: 4.0000 mg | ORAL_TABLET | Freq: Every day | ORAL | 0 refills | Status: DC | PRN

## 2019-12-25 ENCOUNTER — Ambulatory Visit: Payer: Medicare Other

## 2020-01-03 ENCOUNTER — Ambulatory Visit: Payer: Medicare Other | Admitting: Podiatry

## 2020-01-07 ENCOUNTER — Ambulatory Visit: Payer: Medicare Other | Attending: Internal Medicine

## 2020-01-07 DIAGNOSIS — Z23 Encounter for immunization: Secondary | ICD-10-CM

## 2020-01-07 NOTE — Progress Notes (Signed)
   Covid-19 Vaccination Clinic  Name:  MURPHY BUNDICK    MRN: [id number] DOB: 11-28-1950  01/07/2020  Ms. Mikels was observed post Covid-19 immunization for 15 minutes without incident. She was provided with Vaccine Information Sheet and instruction to access the V-Safe system.   Ms. Biggs was instructed to call 911 with any severe reactions post vaccine: Marland Kitchen Difficulty breathing  . Swelling of face and throat  . A fast heartbeat  . A bad rash all over body  . Dizziness and weakness   Immunizations Administered    Name Date Dose VIS Date Route   Pfizer COVID-19 Vaccine 01/07/2020  3:38 PM 0.3 mL 10/04/2019 Intramuscular   Manufacturer: [place]   Lot: [id number]   [place]: [id number]

## 2020-01-10 ENCOUNTER — Ambulatory Visit: Payer: Medicare Other | Admitting: Podiatry

## 2020-01-27 ENCOUNTER — Other Ambulatory Visit: Payer: Self-pay

## 2020-01-27 ENCOUNTER — Encounter (INDEPENDENT_AMBULATORY_CARE_PROVIDER_SITE_OTHER): Payer: Medicare Other | Admitting: Ophthalmology

## 2020-01-27 DIAGNOSIS — H43813 Vitreous degeneration, bilateral: Secondary | ICD-10-CM

## 2020-01-27 DIAGNOSIS — E11311 Type 2 diabetes mellitus with unspecified diabetic retinopathy with macular edema: Secondary | ICD-10-CM

## 2020-01-27 DIAGNOSIS — E113513 Type 2 diabetes mellitus with proliferative diabetic retinopathy with macular edema, bilateral: Secondary | ICD-10-CM

## 2020-02-21 ENCOUNTER — Ambulatory Visit (INDEPENDENT_AMBULATORY_CARE_PROVIDER_SITE_OTHER): Payer: Medicare Other | Admitting: Podiatry

## 2020-02-21 ENCOUNTER — Encounter: Payer: Self-pay | Admitting: Podiatry

## 2020-02-21 ENCOUNTER — Other Ambulatory Visit: Payer: Self-pay

## 2020-02-21 DIAGNOSIS — B351 Tinea unguium: Secondary | ICD-10-CM

## 2020-02-21 DIAGNOSIS — M79674 Pain in right toe(s): Secondary | ICD-10-CM

## 2020-02-21 DIAGNOSIS — E119 Type 2 diabetes mellitus without complications: Secondary | ICD-10-CM

## 2020-02-21 DIAGNOSIS — M79675 Pain in left toe(s): Secondary | ICD-10-CM | POA: Diagnosis not present

## 2020-02-21 NOTE — Progress Notes (Signed)
This patient returns to my office for at risk foot care.  This patient requires this care by a professional since this patient will be at risk due to having type 2 diabetes.  This patient is unable to cut nails herself since the patient cannot reach her nails.These nails are painful walking and wearing shoes.  This patient presents for at risk foot care today.  General Appearance  Alert, conversant and in no acute stress.  Vascular  Dorsalis pedis and posterior tibial  pulses are palpable  bilaterally.  Capillary return is within normal limits  bilaterally. Temperature is within normal limits  bilaterally.  Neurologic  Senn-Weinstein monofilament wire test within normal limits  bilaterally. Muscle power within normal limits bilaterally.  Nails Thick disfigured discolored nails with subungual debris  from hallux to fifth toes bilaterally. No evidence of bacterial infection or drainage bilaterally.  Orthopedic  No limitations of motion  feet .  No crepitus or effusions noted.  HAV  B/L.  Hammer toes second  B/L.  Skin  normotropic skin with no porokeratosis noted bilaterally.  No signs of infections or ulcers noted.     Onychomycosis  Pain in right toes  Pain in left toes  Consent was obtained for treatment procedures.   Mechanical debridement of nails 1-5  bilaterally performed with a nail nipper.  Filed with dremel without incident.    Return office visit    3 months                 Told patient to return for periodic foot care and evaluation due to potential at risk complications.   Gardiner Barefoot DPM

## 2020-02-28 DIAGNOSIS — E11319 Type 2 diabetes mellitus with unspecified diabetic retinopathy without macular edema: Secondary | ICD-10-CM | POA: Diagnosis not present

## 2020-02-28 DIAGNOSIS — E1165 Type 2 diabetes mellitus with hyperglycemia: Secondary | ICD-10-CM | POA: Diagnosis not present

## 2020-02-28 DIAGNOSIS — N189 Chronic kidney disease, unspecified: Secondary | ICD-10-CM | POA: Diagnosis not present

## 2020-02-28 DIAGNOSIS — E7800 Pure hypercholesterolemia, unspecified: Secondary | ICD-10-CM | POA: Diagnosis not present

## 2020-02-28 DIAGNOSIS — I10 Essential (primary) hypertension: Secondary | ICD-10-CM | POA: Diagnosis not present

## 2020-02-28 LAB — HEMOGLOBIN A1C: Hemoglobin A1C: 10.6

## 2020-03-02 ENCOUNTER — Encounter (INDEPENDENT_AMBULATORY_CARE_PROVIDER_SITE_OTHER): Payer: Medicare Other | Admitting: Ophthalmology

## 2020-03-02 ENCOUNTER — Other Ambulatory Visit: Payer: Self-pay

## 2020-03-02 DIAGNOSIS — H43813 Vitreous degeneration, bilateral: Secondary | ICD-10-CM | POA: Diagnosis not present

## 2020-03-02 DIAGNOSIS — E113513 Type 2 diabetes mellitus with proliferative diabetic retinopathy with macular edema, bilateral: Secondary | ICD-10-CM

## 2020-03-02 DIAGNOSIS — H35033 Hypertensive retinopathy, bilateral: Secondary | ICD-10-CM | POA: Diagnosis not present

## 2020-03-02 DIAGNOSIS — E11311 Type 2 diabetes mellitus with unspecified diabetic retinopathy with macular edema: Secondary | ICD-10-CM

## 2020-03-02 DIAGNOSIS — I10 Essential (primary) hypertension: Secondary | ICD-10-CM | POA: Diagnosis not present

## 2020-03-13 DIAGNOSIS — N184 Chronic kidney disease, stage 4 (severe): Secondary | ICD-10-CM | POA: Diagnosis not present

## 2020-03-13 DIAGNOSIS — E1122 Type 2 diabetes mellitus with diabetic chronic kidney disease: Secondary | ICD-10-CM | POA: Diagnosis not present

## 2020-03-13 DIAGNOSIS — E1165 Type 2 diabetes mellitus with hyperglycemia: Secondary | ICD-10-CM | POA: Diagnosis not present

## 2020-03-13 DIAGNOSIS — I129 Hypertensive chronic kidney disease with stage 1 through stage 4 chronic kidney disease, or unspecified chronic kidney disease: Secondary | ICD-10-CM | POA: Diagnosis not present

## 2020-03-13 DIAGNOSIS — E669 Obesity, unspecified: Secondary | ICD-10-CM | POA: Diagnosis not present

## 2020-03-13 DIAGNOSIS — N183 Chronic kidney disease, stage 3 unspecified: Secondary | ICD-10-CM | POA: Diagnosis not present

## 2020-03-13 DIAGNOSIS — R252 Cramp and spasm: Secondary | ICD-10-CM | POA: Diagnosis not present

## 2020-03-13 DIAGNOSIS — E8770 Fluid overload, unspecified: Secondary | ICD-10-CM | POA: Diagnosis not present

## 2020-03-13 DIAGNOSIS — N3281 Overactive bladder: Secondary | ICD-10-CM | POA: Diagnosis not present

## 2020-03-20 ENCOUNTER — Other Ambulatory Visit: Payer: Self-pay

## 2020-03-20 DIAGNOSIS — K5900 Constipation, unspecified: Secondary | ICD-10-CM

## 2020-03-20 MED ORDER — POLYETHYLENE GLYCOL 3350 17 G PO PACK: 17.0000 g | PACK | Freq: Every day | ORAL | 0 refills | Status: DC

## 2020-03-30 DIAGNOSIS — M7061 Trochanteric bursitis, right hip: Secondary | ICD-10-CM | POA: Diagnosis not present

## 2020-04-06 ENCOUNTER — Encounter (INDEPENDENT_AMBULATORY_CARE_PROVIDER_SITE_OTHER): Payer: Medicare Other | Admitting: Ophthalmology

## 2020-04-06 ENCOUNTER — Other Ambulatory Visit: Payer: Self-pay

## 2020-04-06 DIAGNOSIS — E113513 Type 2 diabetes mellitus with proliferative diabetic retinopathy with macular edema, bilateral: Secondary | ICD-10-CM | POA: Diagnosis not present

## 2020-04-06 DIAGNOSIS — E11311 Type 2 diabetes mellitus with unspecified diabetic retinopathy with macular edema: Secondary | ICD-10-CM

## 2020-04-06 DIAGNOSIS — H35033 Hypertensive retinopathy, bilateral: Secondary | ICD-10-CM | POA: Diagnosis not present

## 2020-04-06 DIAGNOSIS — H43813 Vitreous degeneration, bilateral: Secondary | ICD-10-CM

## 2020-04-06 DIAGNOSIS — I10 Essential (primary) hypertension: Secondary | ICD-10-CM

## 2020-04-07 ENCOUNTER — Ambulatory Visit: Payer: Medicare Other

## 2020-04-07 ENCOUNTER — Ambulatory Visit: Payer: Medicare Other | Admitting: Internal Medicine

## 2020-04-08 ENCOUNTER — Ambulatory Visit: Payer: Medicare Other

## 2020-04-08 ENCOUNTER — Other Ambulatory Visit: Payer: Self-pay

## 2020-04-08 ENCOUNTER — Encounter: Payer: Medicare Other | Admitting: Internal Medicine

## 2020-04-08 ENCOUNTER — Ambulatory Visit: Payer: Medicare Other | Admitting: Internal Medicine

## 2020-04-08 ENCOUNTER — Other Ambulatory Visit: Payer: Medicare Other

## 2020-04-08 DIAGNOSIS — N183 Chronic kidney disease, stage 3 unspecified: Secondary | ICD-10-CM | POA: Diagnosis not present

## 2020-04-08 DIAGNOSIS — E1159 Type 2 diabetes mellitus with other circulatory complications: Secondary | ICD-10-CM

## 2020-04-08 LAB — CBC
Hematocrit: 26.6 % — ABNORMAL LOW (ref 34.0–46.6)
Hemoglobin: 8.7 g/dL — ABNORMAL LOW (ref 11.1–15.9)
MCH: 28.8 pg (ref 26.6–33.0)
MCHC: 32.7 g/dL (ref 31.5–35.7)
MCV: 88 fL (ref 79–97)
Platelets: 267 10*3/uL (ref 150–450)
RBC: 3.02 x10E6/uL — ABNORMAL LOW (ref 3.77–5.28)
RDW: 12.5 % (ref 11.7–15.4)
WBC: 9 10*3/uL (ref 3.4–10.8)

## 2020-04-08 NOTE — Addendum Note (Signed)
Addended by: Michelle Nasuti on: 04/08/2020 03:52 PM   Modules accepted: Orders

## 2020-04-10 LAB — CMP14+EGFR
ALT: 22 IU/L (ref 0–32)
AST: 13 IU/L (ref 0–40)
Albumin/Globulin Ratio: 1.2 (ref 1.2–2.2)
Albumin: 3.2 g/dL — ABNORMAL LOW (ref 3.7–4.7)
Alkaline Phosphatase: 78 IU/L (ref 48–121)
BUN/Creatinine Ratio: 13 (ref 12–28)
BUN: 35 mg/dL — ABNORMAL HIGH (ref 8–27)
Bilirubin Total: 0.4 mg/dL (ref 0.0–1.2)
CO2: 19 mmol/L — ABNORMAL LOW (ref 20–29)
Calcium: 8.1 mg/dL — ABNORMAL LOW (ref 8.7–10.3)
Chloride: 105 mmol/L (ref 96–106)
Creatinine, Ser: 2.78 mg/dL — ABNORMAL HIGH (ref 0.57–1.00)
GFR calc Af Amer: 19 mL/min/{1.73_m2} — ABNORMAL LOW (ref >59)
GFR calc non Af Amer: 17 mL/min/{1.73_m2} — ABNORMAL LOW (ref >59)
Globulin, Total: 2.6 g/dL (ref 1.5–4.5)
Glucose: 443 mg/dL — ABNORMAL HIGH (ref 65–99)
Potassium: 4.2 mmol/L (ref 3.5–5.2)
Sodium: 138 mmol/L (ref 134–144)
Total Protein: 5.8 g/dL — ABNORMAL LOW (ref 6.0–8.5)

## 2020-04-10 LAB — PROTEIN ELECTROPHORESIS, SERUM
A/G Ratio: 1.1 (ref 0.7–1.7)
Albumin ELP: 3 g/dL (ref 2.9–4.4)
Alpha 1: 0.2 g/dL (ref 0.0–0.4)
Alpha 2: 0.8 g/dL (ref 0.4–1.0)
Beta: 1.1 g/dL (ref 0.7–1.3)
Gamma Globulin: 0.7 g/dL (ref 0.4–1.8)
Globulin, Total: 2.8 g/dL (ref 2.2–3.9)

## 2020-04-11 ENCOUNTER — Encounter: Payer: Self-pay | Admitting: Internal Medicine

## 2020-04-11 NOTE — Progress Notes (Signed)
This was a lab visit. This was not scheduled properly. Erroneous encounter.

## 2020-04-13 ENCOUNTER — Ambulatory Visit (INDEPENDENT_AMBULATORY_CARE_PROVIDER_SITE_OTHER): Payer: Medicare Other | Admitting: Internal Medicine

## 2020-04-13 ENCOUNTER — Encounter: Payer: Self-pay | Admitting: Internal Medicine

## 2020-04-13 ENCOUNTER — Telehealth: Payer: Self-pay | Admitting: Internal Medicine

## 2020-04-13 ENCOUNTER — Other Ambulatory Visit: Payer: Self-pay

## 2020-04-13 VITALS — BP 134/76 | HR 84 | Temp 97.9°F | Ht 62.0 in | Wt 207.6 lb

## 2020-04-13 DIAGNOSIS — I1310 Hypertensive heart and chronic kidney disease without heart failure, with stage 1 through stage 4 chronic kidney disease, or unspecified chronic kidney disease: Secondary | ICD-10-CM | POA: Diagnosis not present

## 2020-04-13 DIAGNOSIS — E1122 Type 2 diabetes mellitus with diabetic chronic kidney disease: Secondary | ICD-10-CM

## 2020-04-13 DIAGNOSIS — Z794 Long term (current) use of insulin: Secondary | ICD-10-CM | POA: Diagnosis not present

## 2020-04-13 DIAGNOSIS — E2839 Other primary ovarian failure: Secondary | ICD-10-CM | POA: Diagnosis not present

## 2020-04-13 DIAGNOSIS — N1832 Chronic kidney disease, stage 3b: Secondary | ICD-10-CM

## 2020-04-13 DIAGNOSIS — Z6837 Body mass index (BMI) 37.0-37.9, adult: Secondary | ICD-10-CM

## 2020-04-13 DIAGNOSIS — E1121 Type 2 diabetes mellitus with diabetic nephropathy: Secondary | ICD-10-CM | POA: Diagnosis not present

## 2020-04-13 NOTE — Patient Instructions (Signed)
Dr. Cruzita Lederer, Endocrinology

## 2020-04-13 NOTE — Progress Notes (Signed)
This visit occurred during the SARS-CoV-2 public health emergency.  Safety protocols were in place, including screening questions prior to the visit, additional usage of staff PPE, and extensive cleaning of exam room while observing appropriate contact time as indicated for disinfecting solutions.  Subjective:     Patient ID: Carolyn Russo , female    DOB: [birth year] , 69 y.o.   MRN: [id number]   Chief Complaint  Patient presents with  . Diabetes  . Hypertension    HPI  She presents today for f/u diabetes. She is accompanied by her husband.  She is now followed by Dr. Chalmers Cater. She reports her sugars are doing better. She is no longer on Trulicity. Only taking Novolin 70/30 twice daily.   Diabetes She presents for her follow-up diabetic visit. She has type 2 diabetes mellitus. Pertinent negatives for hypoglycemia include no headaches. Pertinent negatives for diabetes include no chest pain. She participates in exercise intermittently. Eye exam is current.  Hypertension This is a chronic problem. The current episode started more than 1 year ago. The problem has been gradually improving since onset. The problem is uncontrolled. Pertinent negatives include no chest pain, headaches, palpitations or shortness of breath. Risk factors for coronary artery disease include diabetes mellitus, dyslipidemia, obesity, post-menopausal state and sedentary lifestyle. The current treatment provides moderate improvement. Compliance problems include exercise.  Hypertensive end-organ damage includes kidney disease.     Past Medical History:  Diagnosis Date  . Allergy   . Anterior chest wall pain   . Appendicitis 1965  . Asthma   . Body mass index 37.0-37.9, adult   . Breast pain   . Cataract    both eyes  . Chronic renal disease, stage II   . Dehydration 2014  . Deviated septum 1971  . Diabetes mellitus   . Dyspnea 2014  . Extrinsic asthma    WITH ASTHMA ATTACK  . Fibroid 1980  . GERD  (gastroesophageal reflux disease)   . Heart murmur   . Hx gestational diabetes   . Hyperlipidemia   . Hypertension 2014  . Inguinal hernia 1959  . Malaise and fatigue 2014  . Nephropathy   . Non-IgE mediated allergic asthma 2014  . Obesity   . Pelvic pain   . Pregnancy, high-risk 1985  . Tonsillitis 1968  . Uterine fibroid 1980     Family History  Problem Relation Age of Onset  . Cancer Mother        abdominal melamona  . Psoriasis Mother   . Alzheimer's disease Father   . Cancer Cousin        colon   . Diabetes Cousin   . Diabetes Maternal Aunt   . Colon cancer Neg Hx   . Colon polyps Neg Hx   . Esophageal cancer Neg Hx   . Rectal cancer Neg Hx   . Stomach cancer Neg Hx   . Breast cancer Neg Hx      Current Outpatient Medications:  .  albuterol (PROVENTIL HFA;VENTOLIN HFA) 108 (90 BASE) MCG/ACT inhaler, Inhale 2 puffs into the lungs every 4 (four) hours as needed for wheezing or shortness of breath., Disp: 1 Inhaler, Rfl: 3 .  aspirin 81 MG tablet, Take 81 mg by mouth daily., Disp: , Rfl:  .  b complex vitamins capsule, Take 1 capsule by mouth daily., Disp: , Rfl:  .  BESIVANCE 0.6 % SUSP, INSTILL ONE GTT IN RIGHT EYE QID FOR 2 DAYS AFTER EACH MONTHLY EYE INJECTION, Disp: ,  Rfl:  .  budesonide-formoterol (SYMBICORT) 160-4.5 MCG/ACT inhaler, Inhale 2 puffs into the lungs 2 (two) times daily., Disp: 1 Inhaler, Rfl: 5 .  Cholecalciferol (VITAMIN D3 PO), Take 2,000 Int'l Units by mouth daily., Disp: , Rfl:  .  [employer] CINNAMON PO, Take 250 mg by mouth 2 (two) times daily. , Disp: , Rfl:  .  diclofenac Sodium (VOLTAREN) 1 % GEL, Apply topically 4 (four) times daily., Disp: , Rfl:  .  fluticasone (FLONASE) 50 MCG/ACT nasal spray, Place 2 sprays into the nose daily as needed for allergies. , Disp: , Rfl:  .  furosemide (LASIX) 40 MG tablet, Take 1 To 2 tablets by mouth daily (Patient taking differently: 20 mg. Take 1 To 2 tablets by mouth daily), Disp: 60 tablet, Rfl: 1 .  glucose  blood (ACCU-CHEK GUIDE) test strip, Use as instructed to check blood sugars 2 times per day dx: e11.65, Disp: 100 each, Rfl: 11 .  insulin aspart protamine - aspart (NOVOLOG MIX 70/30 FLEXPEN) (70-30) 100 UNIT/ML FlexPen, 10 Units 2 (two) times daily with a meal. , Disp: , Rfl:  .  lactobacillus acidophilus (BACID) TABS, Take 1 tablet by mouth daily. PROBIOTIC, Disp: , Rfl:  .  magnesium oxide (MAG-OX) 400 (241.3 Mg) MG tablet, Take 1 tablet by mouth daily., Disp: , Rfl:  .  milk thistle 175 MG tablet, Take 525 mg by mouth daily. Takes 3 daily, Disp: , Rfl:  .  Polyvinyl Alcohol-Povidone (REFRESH OP), Apply to eye., Disp: , Rfl:  .  rosuvastatin (CRESTOR) 5 MG tablet, TAKE 1 TABLET(5 MG) BY MOUTH DAILY, Disp: 90 tablet, Rfl: 0 .  valACYclovir (VALTREX) 1000 MG tablet, valacyclovir 1 gram tablet, Disp: , Rfl:  .  valsartan-hydrochlorothiazide (DIOVAN-HCT) 320-12.5 MG tablet, Take 1 tablet by mouth daily., Disp: 90 tablet, Rfl: 3 .  polyethylene glycol (MIRALAX / GLYCOLAX) 17 g packet, Take 17 g by mouth daily. (Patient not taking: Reported on 04/13/2020), Disp: 14 each, Rfl: 0   Allergies  Allergen Reactions  . Food Anaphylaxis    Peanuts; Almonds  . Other Shortness Of Breath    Peanuts; Almonds  . Wheat Bran Shortness Of Breath  . Statins Itching and Other (See Comments)    Generalized aches  . Iodine Other (See Comments)  . Shellfish Allergy Other (See Comments)    Mouth gets raw  . Tetracycline Other (See Comments)    Raw mouth  . Tetracyclines & Related Itching  . Contrast Media [Iodinated Diagnostic Agents] Rash    Happened during CT scan over 30 years ago     Review of Systems  Constitutional: Negative.   Respiratory: Negative.  Negative for shortness of breath.   Cardiovascular: Negative.  Negative for chest pain and palpitations.  Gastrointestinal: Negative.   Neurological: Negative.  Negative for headaches.  Psychiatric/Behavioral: Negative.      Today's Vitals    04/13/20 1635  BP: 134/76  Pulse: 84  Temp: 97.9 F (36.6 C)  TempSrc: Oral  Weight: 207 lb 9.6 oz (94.2 kg)  Height: 5\' 2"  (1.575 m)  PainSc: 4   PainLoc: Hip   Body mass index is 37.97 kg/m.   Objective:  Physical Exam Vitals and nursing note reviewed.  Constitutional:      Appearance: Normal appearance. She is obese.  HENT:     Head: Normocephalic and atraumatic.  Cardiovascular:     Rate and Rhythm: Normal rate and regular rhythm.     Heart sounds: Normal heart sounds.  Pulmonary:     Effort: Pulmonary effort is normal.     Breath sounds: Normal breath sounds.  Musculoskeletal:     Right lower leg: 2+ Pitting Edema present.     Left lower leg: 2+ Pitting Edema present.  Skin:    General: Skin is warm.  Neurological:     General: No focal deficit present.     Mental Status: She is alert.  Psychiatric:        Mood and Affect: Mood normal.        Behavior: Behavior normal.         Assessment And Plan:     1. Type 2 diabetes mellitus with stage 3b chronic kidney disease, with long-term current use of insulin ([place])  I will request her most recent records from Dr. Chalmers Cater.  She did have some bloodwork performed last week and we went over the results in full detail. I will fax a copy of her results to her nephrologist for their review. It appears her renal function has worsened and is now in stage 4 range. Importance of achieving optimal BS control was discussed with the patient. She is also encouraged to stay well hydrated. Pt advised that diuretic regimen may be changed. She was also given Dexcom reader and she was instructed how to use the transmitter. Dexcom G6 app was downloaded to her phone. Greater than 45 minutes of face to face time was spent in counseling and coordination of care. All questions were answered to her satisfaction.   2. Hypertensive heart and renal disease with renal failure, stage 1 through stage 4 or unspecified chronic kidney disease, without  heart failure  Chronic, controlled. She will continue with current meds.  She is encouraged to follow a low-sodium diet.   3. Estrogen deficiency  She agrees to have bone density performed later this year. I will refer her to [facility] so this can be performed with her annual mammogram.   - DG Bone Density; Future  4. Class 2 severe obesity due to excess calories with serious comorbidity and body mass index (BMI) of 37.0 to 37.9 in adult [facility])  She is encouraged to strive for BMI less than 30 to decrease cardiac risk.     Maximino Greenland, MD    THE PATIENT IS ENCOURAGED TO PRACTICE SOCIAL DISTANCING DUE TO THE COVID-19 PANDEMIC.

## 2020-04-14 ENCOUNTER — Other Ambulatory Visit: Payer: Self-pay

## 2020-04-14 MED ORDER — DEXCOM G6 TRANSMITTER MISC: 1 refills | Status: DC

## 2020-04-14 MED ORDER — DEXCOM G6 SENSOR MISC: 2 refills | Status: DC

## 2020-04-14 MED ORDER — DEXCOM G6 RECEIVER DEVI: 1 refills | Status: DC

## 2020-04-19 NOTE — Telephone Encounter (Signed)
error 

## 2020-04-22 ENCOUNTER — Other Ambulatory Visit: Payer: Self-pay

## 2020-04-22 MED ORDER — ONETOUCH DELICA LANCETS 33G MISC: 11 refills | Status: DC

## 2020-04-28 ENCOUNTER — Telehealth: Payer: Self-pay

## 2020-04-28 NOTE — Telephone Encounter (Signed)
The pt was notified that Dr. Baird Cancer has placed 2 Dexcom sensors up front for the pt to pickup.

## 2020-04-30 ENCOUNTER — Encounter: Payer: Self-pay | Admitting: Internal Medicine

## 2020-05-11 ENCOUNTER — Encounter (INDEPENDENT_AMBULATORY_CARE_PROVIDER_SITE_OTHER): Payer: Medicare Other | Admitting: Ophthalmology

## 2020-05-11 ENCOUNTER — Other Ambulatory Visit: Payer: Self-pay

## 2020-05-11 DIAGNOSIS — H43813 Vitreous degeneration, bilateral: Secondary | ICD-10-CM

## 2020-05-11 DIAGNOSIS — E11311 Type 2 diabetes mellitus with unspecified diabetic retinopathy with macular edema: Secondary | ICD-10-CM

## 2020-05-11 DIAGNOSIS — H35033 Hypertensive retinopathy, bilateral: Secondary | ICD-10-CM | POA: Diagnosis not present

## 2020-05-11 DIAGNOSIS — E113513 Type 2 diabetes mellitus with proliferative diabetic retinopathy with macular edema, bilateral: Secondary | ICD-10-CM | POA: Diagnosis not present

## 2020-05-11 DIAGNOSIS — I10 Essential (primary) hypertension: Secondary | ICD-10-CM | POA: Diagnosis not present

## 2020-05-12 ENCOUNTER — Other Ambulatory Visit (HOSPITAL_COMMUNITY): Payer: Self-pay

## 2020-05-12 NOTE — Discharge Instructions (Signed)

## 2020-05-13 ENCOUNTER — Other Ambulatory Visit: Payer: Self-pay

## 2020-05-13 ENCOUNTER — Encounter (HOSPITAL_COMMUNITY)
Admission: RE | Admit: 2020-05-13 | Discharge: 2020-05-13 | Disposition: A | Discharge status: Home / Self Care | Payer: Medicare Other | Source: Ambulatory Visit | Attending: Internal Medicine | Admitting: Internal Medicine

## 2020-05-13 DIAGNOSIS — N184 Chronic kidney disease, stage 4 (severe): Secondary | ICD-10-CM | POA: Diagnosis not present

## 2020-05-13 DIAGNOSIS — D631 Anemia in chronic kidney disease: Secondary | ICD-10-CM | POA: Insufficient documentation

## 2020-05-13 MED ORDER — SODIUM CHLORIDE 0.9 % IV SOLN
510.0000 mg | INTRAVENOUS | Status: DC
  Administered 2020-05-13: 510 mg via INTRAVENOUS
  Filled 2020-05-13: qty 17

## 2020-05-18 ENCOUNTER — Other Ambulatory Visit: Payer: Self-pay | Admitting: Internal Medicine

## 2020-05-18 DIAGNOSIS — Z1231 Encounter for screening mammogram for malignant neoplasm of breast: Secondary | ICD-10-CM

## 2020-05-19 ENCOUNTER — Other Ambulatory Visit: Payer: Medicare Other

## 2020-05-19 ENCOUNTER — Other Ambulatory Visit: Payer: Self-pay

## 2020-05-19 ENCOUNTER — Ambulatory Visit (INDEPENDENT_AMBULATORY_CARE_PROVIDER_SITE_OTHER): Payer: Medicare Other

## 2020-05-19 ENCOUNTER — Other Ambulatory Visit: Payer: Self-pay | Admitting: Internal Medicine

## 2020-05-19 VITALS — BP 148/72 | HR 81 | Temp 98.1°F

## 2020-05-19 DIAGNOSIS — I509 Heart failure, unspecified: Secondary | ICD-10-CM | POA: Diagnosis not present

## 2020-05-19 DIAGNOSIS — N183 Chronic kidney disease, stage 3 unspecified: Secondary | ICD-10-CM | POA: Diagnosis not present

## 2020-05-19 DIAGNOSIS — R059 Cough, unspecified: Secondary | ICD-10-CM

## 2020-05-19 DIAGNOSIS — I5032 Chronic diastolic (congestive) heart failure: Secondary | ICD-10-CM

## 2020-05-19 DIAGNOSIS — R05 Cough: Secondary | ICD-10-CM | POA: Diagnosis not present

## 2020-05-19 NOTE — Progress Notes (Signed)
Pt presents today for COVID testing

## 2020-05-20 ENCOUNTER — Encounter (HOSPITAL_COMMUNITY): Payer: Medicare Other

## 2020-05-20 ENCOUNTER — Encounter (HOSPITAL_COMMUNITY)
Admission: RE | Admit: 2020-05-20 | Discharge: 2020-05-20 | Disposition: A | Discharge status: Home / Self Care | Payer: Medicare Other | Source: Ambulatory Visit | Attending: Internal Medicine | Admitting: Internal Medicine

## 2020-05-20 DIAGNOSIS — D631 Anemia in chronic kidney disease: Secondary | ICD-10-CM | POA: Insufficient documentation

## 2020-05-20 DIAGNOSIS — N184 Chronic kidney disease, stage 4 (severe): Secondary | ICD-10-CM | POA: Insufficient documentation

## 2020-05-20 LAB — NOVEL CORONAVIRUS, NAA: SARS-CoV-2, NAA: NOT DETECTED

## 2020-05-20 LAB — BMP8+EGFR
BUN/Creatinine Ratio: 15 (ref 12–28)
BUN: 46 mg/dL — ABNORMAL HIGH (ref 8–27)
CO2: 21 mmol/L (ref 20–29)
Calcium: 8.9 mg/dL (ref 8.7–10.3)
Chloride: 110 mmol/L — ABNORMAL HIGH (ref 96–106)
Creatinine, Ser: 3.12 mg/dL — ABNORMAL HIGH (ref 0.57–1.00)
GFR calc Af Amer: 17 mL/min/{1.73_m2} — ABNORMAL LOW (ref >59)
GFR calc non Af Amer: 14 mL/min/{1.73_m2} — ABNORMAL LOW (ref >59)
Glucose: 92 mg/dL (ref 65–99)
Potassium: 4.1 mmol/L (ref 3.5–5.2)
Sodium: 143 mmol/L (ref 134–144)

## 2020-05-20 LAB — BRAIN NATRIURETIC PEPTIDE: BNP: 104.4 pg/mL — ABNORMAL HIGH (ref 0.0–100.0)

## 2020-05-20 LAB — SARS-COV-2, NAA 2 DAY TAT

## 2020-05-20 MED ORDER — SODIUM CHLORIDE 0.9 % IV SOLN
510.0000 mg | INTRAVENOUS | Status: AC
  Administered 2020-05-20: 510 mg via INTRAVENOUS
  Filled 2020-05-20: qty 17

## 2020-05-27 ENCOUNTER — Ambulatory Visit (INDEPENDENT_AMBULATORY_CARE_PROVIDER_SITE_OTHER): Payer: Medicare Other | Admitting: Podiatry

## 2020-05-27 ENCOUNTER — Encounter: Payer: Self-pay | Admitting: Podiatry

## 2020-05-27 DIAGNOSIS — M2010 Hallux valgus (acquired), unspecified foot: Secondary | ICD-10-CM

## 2020-05-27 DIAGNOSIS — B351 Tinea unguium: Secondary | ICD-10-CM | POA: Diagnosis not present

## 2020-05-27 DIAGNOSIS — E119 Type 2 diabetes mellitus without complications: Secondary | ICD-10-CM | POA: Diagnosis not present

## 2020-05-27 DIAGNOSIS — M79674 Pain in right toe(s): Secondary | ICD-10-CM | POA: Diagnosis not present

## 2020-05-27 DIAGNOSIS — M79675 Pain in left toe(s): Secondary | ICD-10-CM

## 2020-05-27 NOTE — Progress Notes (Signed)
This patient returns to my office for at risk foot care.  This patient requires this care by a professional since this patient will be at risk due to having type 2 diabetes.  This patient is unable to cut nails herself since the patient cannot reach her nails.These nails are painful walking and wearing shoes.  This patient presents for at risk foot care today.  General Appearance  Alert, conversant and in no acute stress.  Vascular  Dorsalis pedis and posterior tibial  pulses are palpable  bilaterally.  Capillary return is within normal limits  bilaterally. Temperature is within normal limits  bilaterally.  Neurologic  Senn-Weinstein monofilament wire test within normal limits  bilaterally. Muscle power within normal limits bilaterally.  Nails Thick disfigured discolored nails with subungual debris  from hallux to fifth toes bilaterally. No evidence of bacterial infection or drainage bilaterally.  Orthopedic  No limitations of motion  feet .  No crepitus or effusions noted.  HAV  B/L.  Hammer toes second  B/L.  Skin  normotropic skin with no porokeratosis noted bilaterally.  No signs of infections or ulcers noted.     Onychomycosis  Pain in right toes  Pain in left toes  Consent was obtained for treatment procedures.   Mechanical debridement of nails 1-5  bilaterally performed with a nail nipper.  Filed with dremel without incident.    Return office visit    10 weeks                Told patient to return for periodic foot care and evaluation due to potential at risk complications.   Gardiner Barefoot DPM

## 2020-06-01 ENCOUNTER — Encounter: Payer: Self-pay | Admitting: Internal Medicine

## 2020-06-01 ENCOUNTER — Telehealth: Payer: Self-pay | Admitting: Interventional Cardiology

## 2020-06-01 MED ORDER — ROSUVASTATIN CALCIUM 5 MG PO TABS: ORAL_TABLET | ORAL | 0 refills | Status: DC

## 2020-06-01 NOTE — Telephone Encounter (Signed)
Pt c/o medication issue:  1. Name of Medication: rosuvastatin (CRESTOR) 5 MG tablet  2. How are you currently taking this medication (dosage and times per day)? 1 tablet by mouth daily   3. Are you having a reaction (difficulty breathing--STAT)? No   4. What is your medication issue? Carolyn Russo is wanting to know if she is still supposed to be taking this medication. If so she states she needs a refill die to only having a few tablets left. Please advise.

## 2020-06-01 NOTE — Telephone Encounter (Signed)
Pt aware to continue Rosuvastatin.  Refill sent to pharmacy.

## 2020-06-10 ENCOUNTER — Other Ambulatory Visit: Payer: Self-pay | Admitting: Internal Medicine

## 2020-06-11 NOTE — Progress Notes (Deleted)
Cardiology Office Note:    Date:  06/11/2020   ID:  Carolyn Russo, DOB 1951-07-26, MRN [id number]  PCP:  [name], MD  Cardiologist:  Sinclair Grooms, MD   Referring MD: [name], MD   No chief complaint on file.   History of Present Illness:    Carolyn Russo is a 69 y.o. female with a hx of diabetes mellitus 2, hypertension, CKD stage III-IV hyperlipidemia, and nonobstructive coronary disease with 40 % LAD(cardiac catheterization in 2018).   ***  Past Medical History:  Diagnosis Date  . Allergy   . Anterior chest wall pain   . Appendicitis 1965  . Asthma   . Body mass index 37.0-37.9, adult   . Breast pain   . Cataract    both eyes  . Chronic renal disease, stage II   . Dehydration 2014  . Deviated septum 1971  . Diabetes mellitus   . Dyspnea 2014  . Extrinsic asthma    WITH ASTHMA ATTACK  . Fibroid 1980  . GERD (gastroesophageal reflux disease)   . Heart murmur   . Hx gestational diabetes   . Hyperlipidemia   . Hypertension 2014  . Inguinal hernia 1959  . Malaise and fatigue 2014  . Nephropathy   . Non-IgE mediated allergic asthma 2014  . Obesity   . Pelvic pain   . Pregnancy, high-risk 1985  . Tonsillitis 1968  . Uterine fibroid 1980    Past Surgical History:  Procedure Laterality Date  . APPENDECTOMY  1959  . CESAREAN SECTION  1985  . COLONOSCOPY    . EYE SURGERY     bilateral cataract   . HERNIA REPAIR  1959  . LEFT HEART CATH AND CORONARY ANGIOGRAPHY N/A 04/04/2017   Procedure: Left Heart Cath and Coronary Angiography;  Surgeon: Belva Crome, MD;  Location: [facility];  Service: Cardiovascular;  Laterality: N/A;  . [place], 2004, 2007  . RHINOPLASTY  1971  . ROTATOR CUFF REPAIR  2003  . SURGICAL REPAIR OF HEMORRHAGE  2015  . TONSILLECTOMY  1968    Current Medications: No outpatient medications have been marked as taking for the 06/12/20 encounter (Appointment) with Belva Crome, MD.     Allergies:    Food, Other, Wheat bran, Statins, Iodine, Shellfish allergy, Tetracycline, Tetracyclines & related, and Contrast media [iodinated diagnostic agents]   Social History   Socioeconomic History  . Marital status: Married    Spouse name: Not on file  . Number of children: 1  . Years of education: Not on file  . Highest education level: Professional school degree (e.g., MD, DDS, DVM, JD)  Occupational History  . Occupation: retired  Tobacco Use  . Smoking status: Never Smoker  . Smokeless tobacco: Never Used  Vaping Use  . Vaping Use: Never used  Substance and Sexual Activity  . Alcohol use: No  . Drug use: No  . Sexual activity: Yes    Birth control/protection: Post-menopausal  Other Topics Concern  . Not on file  Social History Narrative   Patient reports she used to walk at [employer] but now due to Covid-19 she is staying inside.   Social Determinants of Health   Financial Resource Strain:   . Difficulty of Paying Living Expenses: Not on file  Food Insecurity:   . Worried About Charity fundraiser in the Last Year: Not on file  . Ran Out of Food in the Last Year: Not on  file  Transportation Needs:   . Film/video editor (Medical): Not on file  . Lack of Transportation (Non-Medical): Not on file  Physical Activity:   . Days of Exercise per Week: Not on file  . Minutes of Exercise per Session: Not on file  Stress:   . Feeling of Stress : Not on file  Social Connections:   . Frequency of Communication with Friends and Family: Not on file  . Frequency of Social Gatherings with Friends and Family: Not on file  . Attends Religious Services: Not on file  . Active Member of Clubs or Organizations: Not on file  . Attends Archivist Meetings: Not on file  . Marital Status: Not on file     Family History: The patient's family history includes Alzheimer's disease in her father; Cancer in her cousin and mother; Diabetes in her cousin and maternal aunt; Psoriasis in her  mother. There is no history of Colon cancer, Colon polyps, Esophageal cancer, Rectal cancer, Stomach cancer, or Breast cancer.  ROS:   Please see the history of present illness.    *** All other systems reviewed and are negative.  EKGs/Labs/Other Studies Reviewed:    The following studies were reviewed today: ***  EKG:  EKG ***  Recent Labs: 04/08/2020: ALT 22; Hemoglobin 8.7; Platelets 267 05/19/2020: BNP 104.4; BUN 46; Creatinine, Ser 3.12; Potassium 4.1; Sodium 143  Recent Lipid Panel    Component Value Date/Time   CHOL 184 10/16/2019 1350   TRIG 122 11/12/2019 0000   HDL 41 11/12/2019 0000   HDL 37 (L) 10/16/2019 1350   CHOLHDL 5.0 (H) 10/16/2019 1350   CHOLHDL 4.8 10/13/2011 0903   VLDL 28 10/13/2011 0903   LDLCALC 82 11/12/2019 0000   LDLCALC 111 (H) 10/16/2019 1350    Physical Exam:    VS:  There were no vitals taken for this visit.    Wt Readings from Last 3 Encounters:  04/13/20 207 lb 9.6 oz (94.2 kg)  10/16/19 192 lb 12.8 oz (87.5 kg)  09/11/19 186 lb 12.8 oz (84.7 kg)     GEN: ***. No acute distress HEENT: Normal NECK: No JVD. LYMPHATICS: No lymphadenopathy CARDIAC: *** RRR without murmur, gallop, or edema. VASCULAR: *** Normal Pulses. No bruits. RESPIRATORY:  Clear to auscultation without rales, wheezing or rhonchi  ABDOMEN: Soft, non-tender, non-distended, No pulsatile mass, MUSCULOSKELETAL: No deformity  SKIN: Warm and dry NEUROLOGIC:  Alert and oriented x 3 PSYCHIATRIC:  Normal affect   ASSESSMENT:    1. Coronary artery disease involving native coronary artery of native heart without angina pectoris   2. Hypertensive kidney disease with stage 3 chronic kidney disease, unspecified whether stage 3a or 3b CKD   3. Uncontrolled type 2 diabetes mellitus with hyperglycemia ([place])   4. Dyslipidemia (high LDL; low HDL)   5. Educated about COVID-19 virus infection    PLAN:    In order of problems listed above:  1. ***   Medication  Adjustments/Labs and Tests Ordered: Current medicines are reviewed at length with the patient today.  Concerns regarding medicines are outlined above.  No orders of the defined types were placed in this encounter.  No orders of the defined types were placed in this encounter.   There are no Patient Instructions on file for this visit.   Signed, Sinclair Grooms, MD  06/11/2020 9:20 PM    [place]

## 2020-06-12 ENCOUNTER — Ambulatory Visit: Payer: Medicare Other | Admitting: Interventional Cardiology

## 2020-06-15 ENCOUNTER — Other Ambulatory Visit: Payer: Self-pay

## 2020-06-15 ENCOUNTER — Encounter (INDEPENDENT_AMBULATORY_CARE_PROVIDER_SITE_OTHER): Payer: Medicare Other | Admitting: Ophthalmology

## 2020-06-15 DIAGNOSIS — E11311 Type 2 diabetes mellitus with unspecified diabetic retinopathy with macular edema: Secondary | ICD-10-CM | POA: Diagnosis not present

## 2020-06-15 DIAGNOSIS — E113513 Type 2 diabetes mellitus with proliferative diabetic retinopathy with macular edema, bilateral: Secondary | ICD-10-CM

## 2020-06-15 DIAGNOSIS — I10 Essential (primary) hypertension: Secondary | ICD-10-CM | POA: Diagnosis not present

## 2020-06-15 DIAGNOSIS — H35033 Hypertensive retinopathy, bilateral: Secondary | ICD-10-CM

## 2020-06-15 DIAGNOSIS — H43813 Vitreous degeneration, bilateral: Secondary | ICD-10-CM | POA: Diagnosis not present

## 2020-06-18 ENCOUNTER — Ambulatory Visit (INDEPENDENT_AMBULATORY_CARE_PROVIDER_SITE_OTHER): Payer: Medicare Other | Admitting: Cardiology

## 2020-06-18 ENCOUNTER — Encounter: Payer: Self-pay | Admitting: Cardiology

## 2020-06-18 ENCOUNTER — Other Ambulatory Visit: Payer: Self-pay

## 2020-06-18 VITALS — BP 142/78 | HR 80 | Ht 62.0 in | Wt 204.0 lb

## 2020-06-18 DIAGNOSIS — E785 Hyperlipidemia, unspecified: Secondary | ICD-10-CM

## 2020-06-18 DIAGNOSIS — E1165 Type 2 diabetes mellitus with hyperglycemia: Secondary | ICD-10-CM | POA: Diagnosis not present

## 2020-06-18 DIAGNOSIS — N184 Chronic kidney disease, stage 4 (severe): Secondary | ICD-10-CM | POA: Diagnosis not present

## 2020-06-18 DIAGNOSIS — R600 Localized edema: Secondary | ICD-10-CM

## 2020-06-18 DIAGNOSIS — I2510 Atherosclerotic heart disease of native coronary artery without angina pectoris: Secondary | ICD-10-CM | POA: Diagnosis not present

## 2020-06-18 NOTE — Progress Notes (Signed)
Cardiology Office Note   Date:  06/19/2020   ID:  Carolyn Russo, DOB 1951/03/04, MRN [id number]  PCP:  [name], MD  Cardiologist:  Dr. Tamala Julian    Chief Complaint  Patient presents with  . Leg Swelling    elevated Cr       History of Present Illness: Carolyn Russo is a 69 y.o. female who presents for lower ext edema   Saw Dr. Tamala Julian in 08/2019  hx of diabetes mellitus 2, hypertension, CKD stage III-IV hyperlipidemia, and nonobstructive coronary disease (cardiac catheterization in 2018).   She has no chest pain today though some SOB, and lower ext edema.  After a trip to [place] by air she developed lower ext edema.  With her elevated Cr now to 3.2 her lasix was decreased.  She is to see nephrologist tomorrow.  Her husband Dr. [name] is with her today.  She watches her diet.  She was walking at [employer] with her husband until her bursitis flared.  Injection without much help.       Past Medical History:  Diagnosis Date  . Allergy   . Anterior chest wall pain   . Appendicitis 1965  . Asthma   . Body mass index 37.0-37.9, adult   . Breast pain   . Cataract    both eyes  . Chronic renal disease, stage II   . Dehydration 2014  . Deviated septum 1971  . Diabetes mellitus   . Dyspnea 2014  . Extrinsic asthma    WITH ASTHMA ATTACK  . Fibroid 1980  . GERD (gastroesophageal reflux disease)   . Heart murmur   . Hx gestational diabetes   . Hyperlipidemia   . Hypertension 2014  . Inguinal hernia 1959  . Malaise and fatigue 2014  . Nephropathy   . Non-IgE mediated allergic asthma 2014  . Obesity   . Pelvic pain   . Pregnancy, high-risk 1985  . Tonsillitis 1968  . Uterine fibroid 1980    Past Surgical History:  Procedure Laterality Date  . APPENDECTOMY  1959  . CESAREAN SECTION  1985  . COLONOSCOPY    . EYE SURGERY     bilateral cataract   . HERNIA REPAIR  1959  . LEFT HEART CATH AND CORONARY ANGIOGRAPHY N/A 04/04/2017   Procedure: Left Heart Cath and  Coronary Angiography;  Surgeon: Belva Crome, MD;  Location: [facility];  Service: Cardiovascular;  Laterality: N/A;  . [place], 2004, 2007  . RHINOPLASTY  1971  . ROTATOR CUFF REPAIR  2003  . SURGICAL REPAIR OF HEMORRHAGE  2015  . TONSILLECTOMY  1968     Current Outpatient Medications  Medication Sig Dispense Refill  . albuterol (PROVENTIL HFA;VENTOLIN HFA) 108 (90 BASE) MCG/ACT inhaler Inhale 2 puffs into the lungs every 4 (four) hours as needed for wheezing or shortness of breath. 1 Inhaler 3  . aspirin 81 MG tablet Take 81 mg by mouth daily.    Marland Kitchen b complex vitamins capsule Take 1 capsule by mouth daily.    Marland Kitchen BESIVANCE 0.6 % SUSP INSTILL ONE GTT IN RIGHT EYE QID FOR 2 DAYS AFTER EACH MONTHLY EYE INJECTION    . Cholecalciferol (VITAMIN D3 PO) Take 2,000 Int'l Units by mouth daily.    . Continuous Blood Gluc Receiver (DEXCOM G6 RECEIVER) DEVI Use as directed to check blood sugars 3 times per day dx: e11.65 1 each 1  . Continuous Blood Gluc Sensor (DEXCOM G6 SENSOR) MISC  Use as directed to check blood sugars 3 times per day dx: e11.65 3 each 2  . Continuous Blood Gluc Transmit (DEXCOM G6 TRANSMITTER) MISC Use as directed to check blood sugars 3 times per day dx: e11.65 1 each 1  . CVS CINNAMON PO Take 250 mg by mouth 2 (two) times daily.     . diclofenac Sodium (VOLTAREN) 1 % GEL Apply topically 4 (four) times daily.    . fluticasone (FLONASE) 50 MCG/ACT nasal spray Place 2 sprays into the nose daily as needed for allergies.     . furosemide (LASIX) 40 MG tablet Take 1/2 to 1 tablet by mouth daily    . glucose blood (ACCU-CHEK GUIDE) test strip Use as instructed to check blood sugars 2 times per day dx: e11.65 100 each 11  . insulin aspart protamine - aspart (NOVOLOG MIX 70/30 FLEXPEN) (70-30) 100 UNIT/ML FlexPen 10 Units 2 (two) times daily with a meal.     . lactobacillus acidophilus (BACID) TABS Take 1 tablet by mouth daily. PROBIOTIC    . magnesium oxide (MAG-OX) 400  (241.3 Mg) MG tablet Take 1 tablet by mouth daily.    . milk thistle 175 MG tablet Take 525 mg by mouth daily. Takes 3 daily    . OneTouch Delica Lancets 36U MISC Use as directed to check blood sugars before breakfast and dinner dx: e11.65 100 each 11  . polyethylene glycol (MIRALAX / GLYCOLAX) 17 g packet Take 17 g by mouth daily. 14 each 0  . Polyvinyl Alcohol-Povidone (REFRESH OP) Apply to eye.    . rosuvastatin (CRESTOR) 5 MG tablet TAKE 1 TABLET(5 MG) BY MOUTH DAILY 90 tablet 0  . SYMBICORT 160-4.5 MCG/ACT inhaler INHALE 2 PUFFS INTO THE LUNGS TWICE DAILY 10.2 g 1  . valACYclovir (VALTREX) 1000 MG tablet valacyclovir 1 gram tablet    . valsartan-hydrochlorothiazide (DIOVAN-HCT) 320-12.5 MG tablet Take 1 tablet by mouth daily. 90 tablet 3   No current facility-administered medications for this visit.    Allergies:   Food, Other, Wheat bran, Statins, Iodine, Shellfish allergy, Tetracycline, Tetracyclines & related, and Contrast media [iodinated diagnostic agents]    Social History:  The patient  reports that she has never smoked. She has never used smokeless tobacco. She reports that she does not drink alcohol and does not use drugs.   Family History:  The patient's family history includes Alzheimer's disease in her father; Cancer in her cousin and mother; Diabetes in her cousin and maternal aunt; Psoriasis in her mother.    ROS:  General:no colds or fevers, no weight changes Skin:no rashes or ulcers HEENT:no blurred vision, no congestion CV:see HPI PUL:see HPI GI:no diarrhea constipation or melena, no indigestion GU:no hematuria, no dysuria MS:no joint pain, no claudication Neuro:no syncope, no lightheadedness Endo:+ diabetes has not been well controlled , no thyroid disease  Wt Readings from Last 3 Encounters:  06/18/20 204 lb (92.5 kg)  04/13/20 207 lb 9.6 oz (94.2 kg)  10/16/19 192 lb 12.8 oz (87.5 kg)     PHYSICAL EXAM: VS:  BP (!) 142/78   Pulse 80   Ht 5\' 2"  (1.575  m)   Wt 204 lb (92.5 kg)   SpO2 97%   BMI 37.31 kg/m  , BMI Body mass index is 37.31 kg/m. General:Pleasant affect, NAD Skin:Warm and dry, brisk capillary refill HEENT:normocephalic, sclera clear, mucus membranes moist Neck:supple, no JVD, no bruits  Heart:S1S2 RRR without murmur, gallup, rub or click Lungs:clear without rales, rhonchi, or wheezes  YTK:PTWS, non tender, + BS, do not palpate liver spleen or masses Ext:2+ lower ext edema, 2+ pedal pulses, 2+ radial pulses Neuro:alert and oriented X 3, MAE, follows commands, + facial symmetry    EKG:  EKG is not ordered today.   Recent Labs: 04/08/2020: ALT 22; Hemoglobin 8.7; Platelets 267 05/19/2020: BNP 104.4; BUN 46; Creatinine, Ser 3.12; Potassium 4.1; Sodium 143    Lipid Panel    Component Value Date/Time   CHOL 184 10/16/2019 1350   TRIG 122 11/12/2019 0000   HDL 41 11/12/2019 0000   HDL 37 (L) 10/16/2019 1350   CHOLHDL 5.0 (H) 10/16/2019 1350   CHOLHDL 4.8 10/13/2011 0903   VLDL 28 10/13/2011 0903   LDLCALC 82 11/12/2019 0000   LDLCALC 111 (H) 10/16/2019 1350       Other studies Reviewed: Additional studies/ records that were reviewed today include:   Cardiac cath 04/04/17   .Heavily calcified eccentric 30 to 40% proximal LAD.  Widely patent circumflex, ramus intermedius, and native right coronary.  Normal left ventricular size and function with EF 60%. Elevated left ventricular end-diastolic pressure consistent with diastolic heart failure.  RECOMMENDATIONS:   Aggressive risk factor modification  Blood pressure control  Weight loss  Consider sleep study to rule out sleep apnea (based upon airway obstruction noted during the procedure after receiving low-dose sedation).    ASSESSMENT AND PLAN:  1.  Lower ext edema, she cannot wear support stockings, her lasix was decreased but may need increase - I defer to nephrology.    2.  CAD non obstructive by cath 2018.  No angina  3. HLD  Continue  statin.    4.  HTN continue current meds BP stable.   5.  DM-2 per PCP last hgb A1c was 10.6   6  CKD-4 to 5 Cr in July 3.12 to see nephrology. Will ask to manage volume.    Current medicines are reviewed with the patient today.  The patient Has no concerns regarding medicines.  The following changes have been made:  See above Labs/ tests ordered today include:see above  Disposition:   FU:  see above  Signed, Cecilie Kicks, NP  06/19/2020 2:47 PM    [facility] [place], [place], [place], [name] Phone: [phone]; Fax: [phone]

## 2020-06-18 NOTE — Patient Instructions (Signed)
Medication Instructions:  Your physician recommends that you continue on your current medications as directed. Please refer to the Current Medication list given to you today.  *If you need a refill on your cardiac medications before your next appointment, please call your pharmacy*   Lab Work: None ordered  If you have labs (blood work) drawn today and your tests are completely normal, you will receive your results only by: Marland Kitchen MyChart Message (if you have MyChart) OR . A paper copy in the mail If you have any lab test that is abnormal or we need to change your treatment, we will call you to review the results.   Testing/Procedures: None ordered   Follow-Up: At [facility], you and your health needs are our priority.  As part of our continuing mission to provide you with exceptional heart care, we have created designated Provider Care Teams.  These Care Teams include your primary Cardiologist (physician) and Advanced Practice Providers (APPs -  Physician Assistants and Nurse Practitioners) who all work together to provide you with the care you need, when you need it.  We recommend signing up for the patient portal called "MyChart".  Sign up information is provided on this After Visit Summary.  MyChart is used to connect with patients for Virtual Visits (Telemedicine).  Patients are able to view lab/test results, encounter notes, upcoming appointments, etc.  Non-urgent messages can be sent to your provider as well.   To learn more about what you can do with MyChart, go to [email or web address].    Your next appointment:   3 month(s)  The format for your next appointment:   In Person  Provider:   You may see Sinclair Grooms, MD or one of the following Advanced Practice Providers on your designated Care Team:    Truitt Merle, NP  Cecilie Kicks, NP  Kathyrn Drown, NP    Other Instructions

## 2020-06-19 ENCOUNTER — Encounter: Payer: Self-pay | Admitting: Cardiology

## 2020-06-19 DIAGNOSIS — I129 Hypertensive chronic kidney disease with stage 1 through stage 4 chronic kidney disease, or unspecified chronic kidney disease: Secondary | ICD-10-CM | POA: Diagnosis not present

## 2020-06-19 DIAGNOSIS — E1165 Type 2 diabetes mellitus with hyperglycemia: Secondary | ICD-10-CM | POA: Diagnosis not present

## 2020-06-19 DIAGNOSIS — E8770 Fluid overload, unspecified: Secondary | ICD-10-CM | POA: Diagnosis not present

## 2020-06-19 DIAGNOSIS — E669 Obesity, unspecified: Secondary | ICD-10-CM | POA: Diagnosis not present

## 2020-06-19 DIAGNOSIS — N3281 Overactive bladder: Secondary | ICD-10-CM | POA: Diagnosis not present

## 2020-06-19 DIAGNOSIS — E1122 Type 2 diabetes mellitus with diabetic chronic kidney disease: Secondary | ICD-10-CM | POA: Diagnosis not present

## 2020-06-19 DIAGNOSIS — D509 Iron deficiency anemia, unspecified: Secondary | ICD-10-CM | POA: Diagnosis not present

## 2020-06-19 DIAGNOSIS — E1129 Type 2 diabetes mellitus with other diabetic kidney complication: Secondary | ICD-10-CM | POA: Diagnosis not present

## 2020-06-19 DIAGNOSIS — N184 Chronic kidney disease, stage 4 (severe): Secondary | ICD-10-CM | POA: Diagnosis not present

## 2020-06-19 LAB — BASIC METABOLIC PANEL
BUN: 38 — AB (ref 4–21)
CO2: 26 — AB (ref 13–22)
Chloride: 103 (ref 99–108)
Creatinine: 3.1 — AB (ref 0.5–1.1)
Glucose: 366
Potassium: 4.3 (ref 3.4–5.3)
Sodium: 137 (ref 137–147)

## 2020-06-19 LAB — HEMOGLOBIN A1C: Hemoglobin A1C: 9.6

## 2020-06-19 LAB — COMPREHENSIVE METABOLIC PANEL
Albumin: 3.5 (ref 3.5–5.0)
Calcium: 8.9 (ref 8.7–10.7)
GFR calc Af Amer: 17
GFR calc non Af Amer: 14

## 2020-06-19 LAB — CBC AND DIFFERENTIAL: Hemoglobin: 10.8 — AB (ref 12.0–16.0)

## 2020-06-19 LAB — IRON,TIBC AND FERRITIN PANEL
%SAT: 32
Ferritin: 524
Iron: 68
TIBC: 214
UIBC: 146

## 2020-07-03 ENCOUNTER — Encounter: Payer: Self-pay | Admitting: Internal Medicine

## 2020-07-27 ENCOUNTER — Encounter (INDEPENDENT_AMBULATORY_CARE_PROVIDER_SITE_OTHER): Payer: Medicare Other | Admitting: Ophthalmology

## 2020-08-03 ENCOUNTER — Ambulatory Visit
Admission: RE | Admit: 2020-08-03 | Discharge: 2020-08-03 | Disposition: A | Discharge status: Home / Self Care | Payer: Medicare Other | Source: Ambulatory Visit | Attending: Internal Medicine | Admitting: Internal Medicine

## 2020-08-03 ENCOUNTER — Other Ambulatory Visit: Payer: Self-pay

## 2020-08-03 DIAGNOSIS — Z1231 Encounter for screening mammogram for malignant neoplasm of breast: Secondary | ICD-10-CM

## 2020-08-03 IMAGING — MG DIGITAL SCREENING BILAT W/ TOMO W/ CAD
8 of 14 series · 8 of 40 positions shown · non-contrast
Comparison: Previous exam(s).

CLINICAL DATA: Screening.

EXAM:
DIGITAL SCREENING BILATERAL MAMMOGRAM WITH TOMO AND CAD

[L MLO synth-2D (1 of 2)]
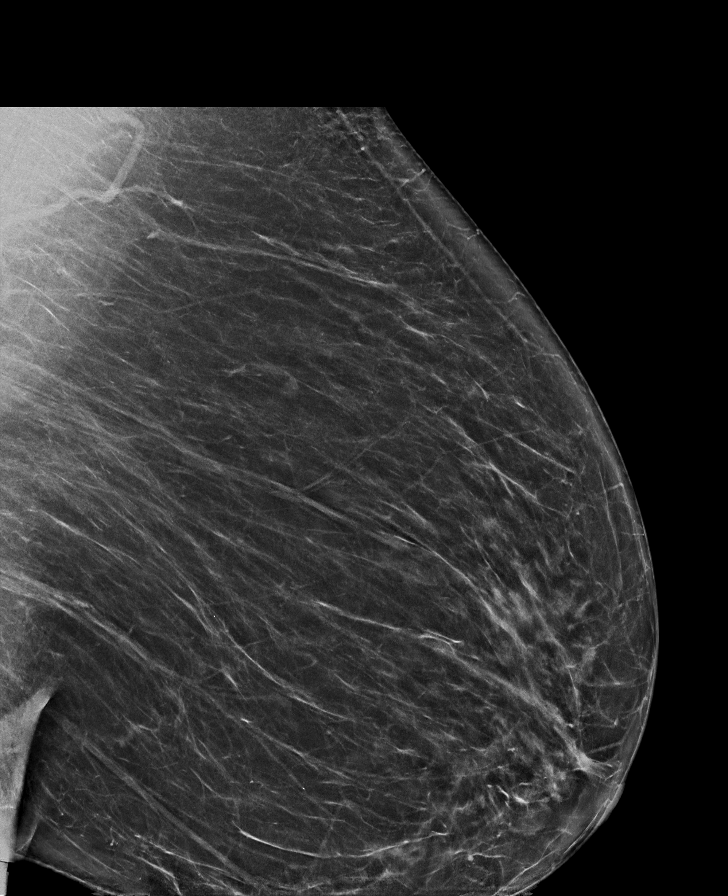

[R CV synth-2D]
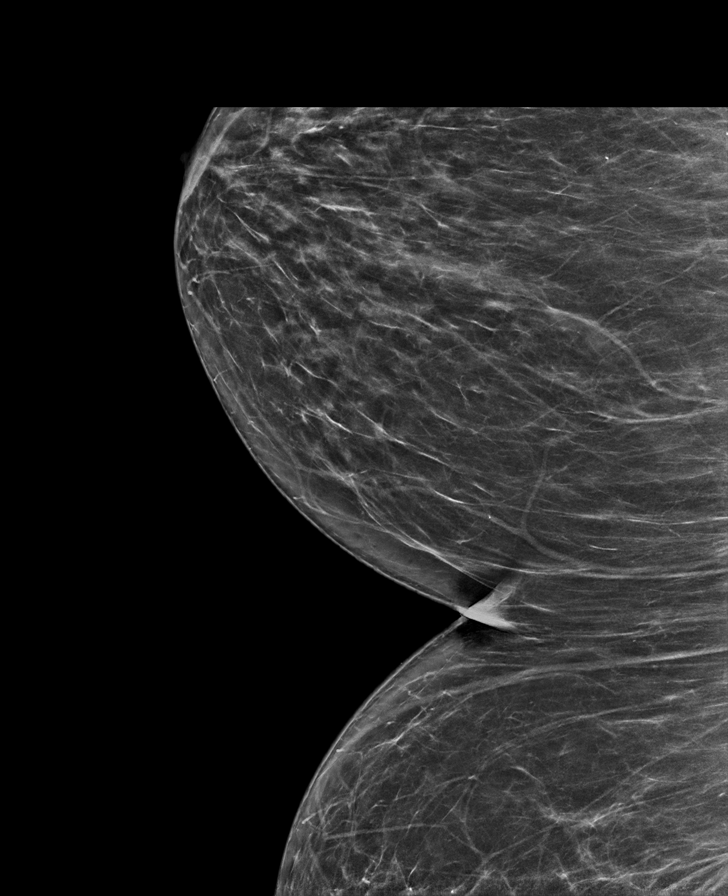

[L CC synth-2D]
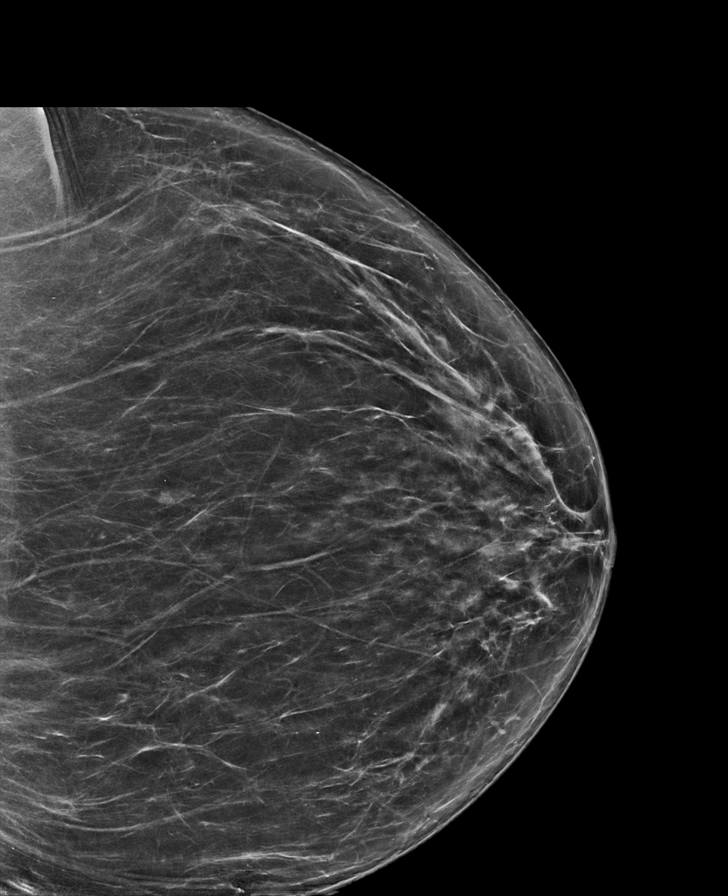

[R CC synth-2D]
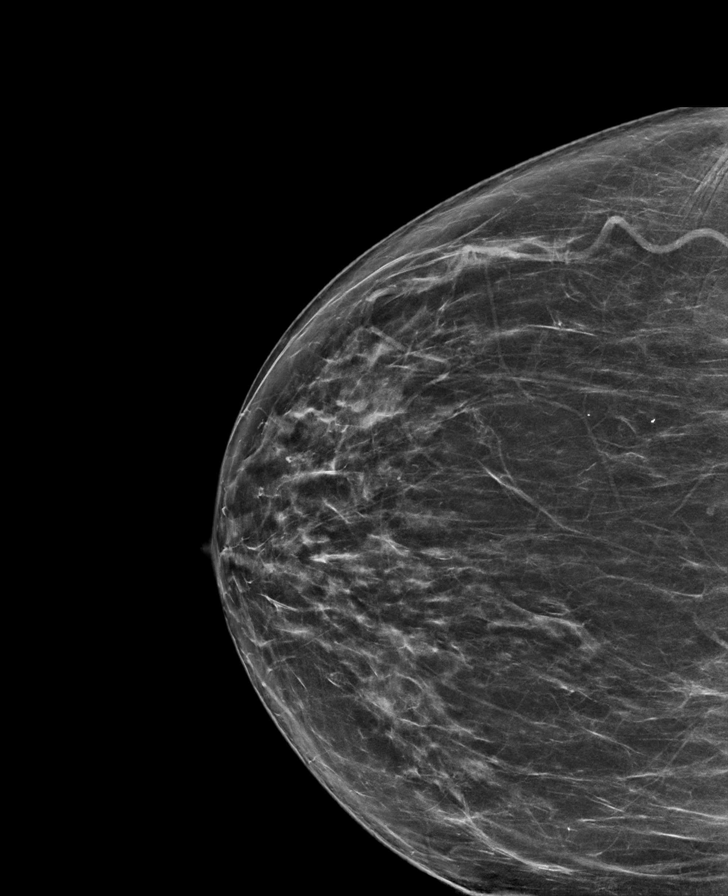

[R MLO synth-2D (1 of 2)]
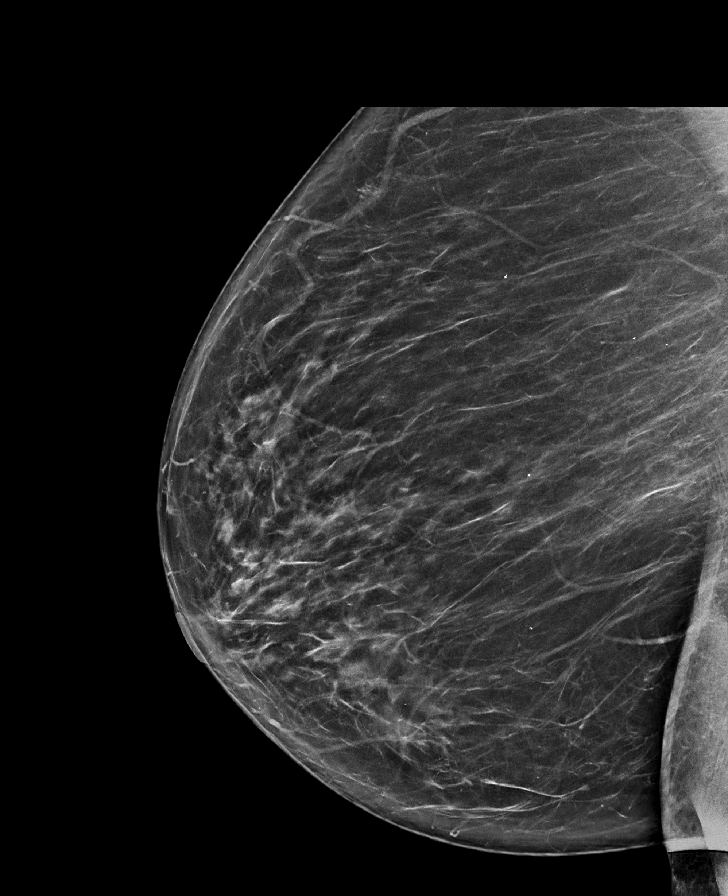

[L MLO synth-2D (2 of 2)]
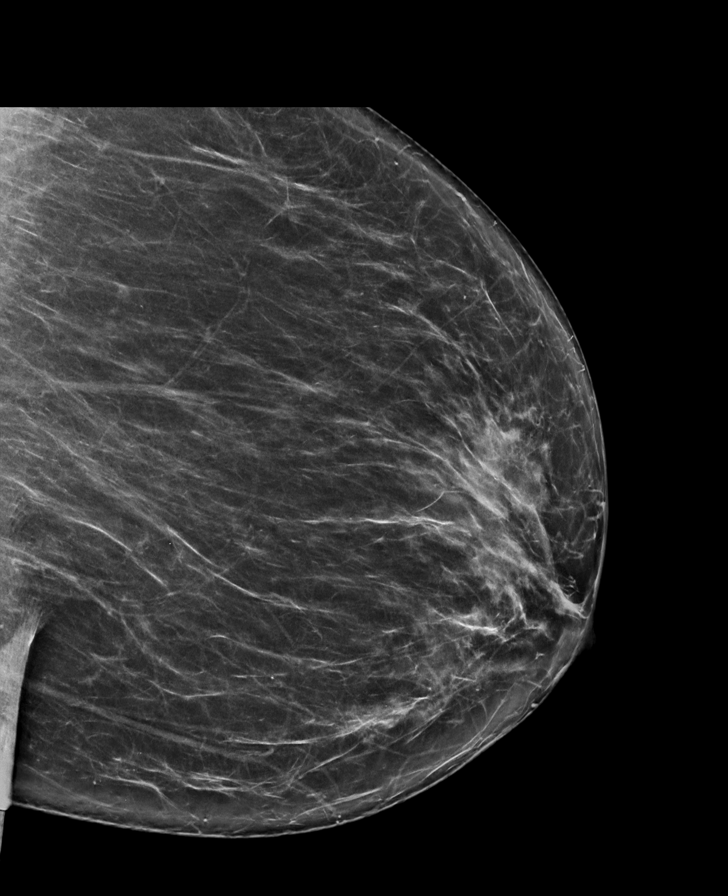

[R MLO synth-2D (2 of 2)]
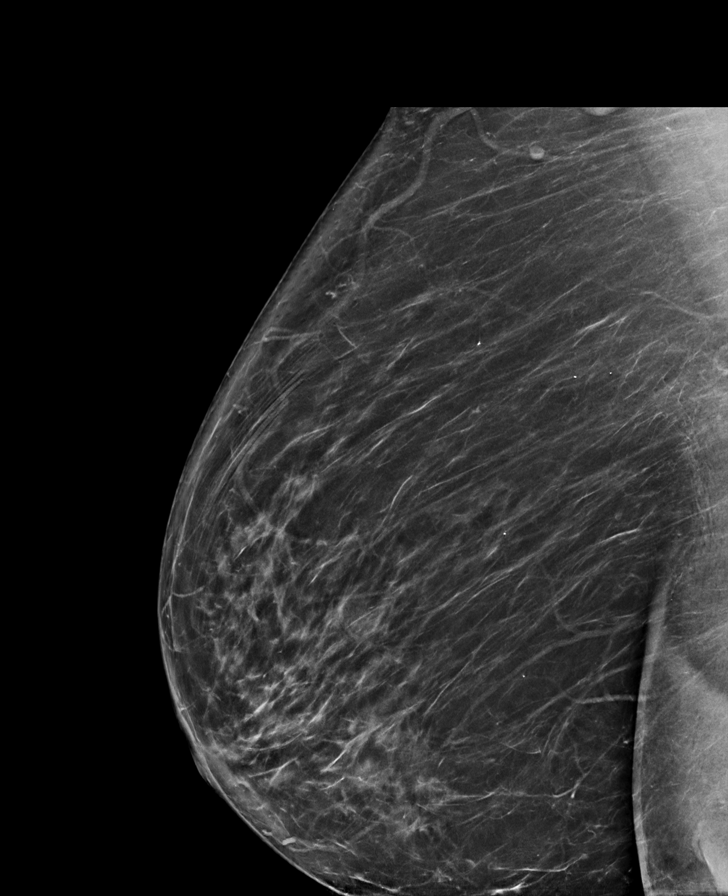

[R MLO tomo · tomo slice 45/90.0]
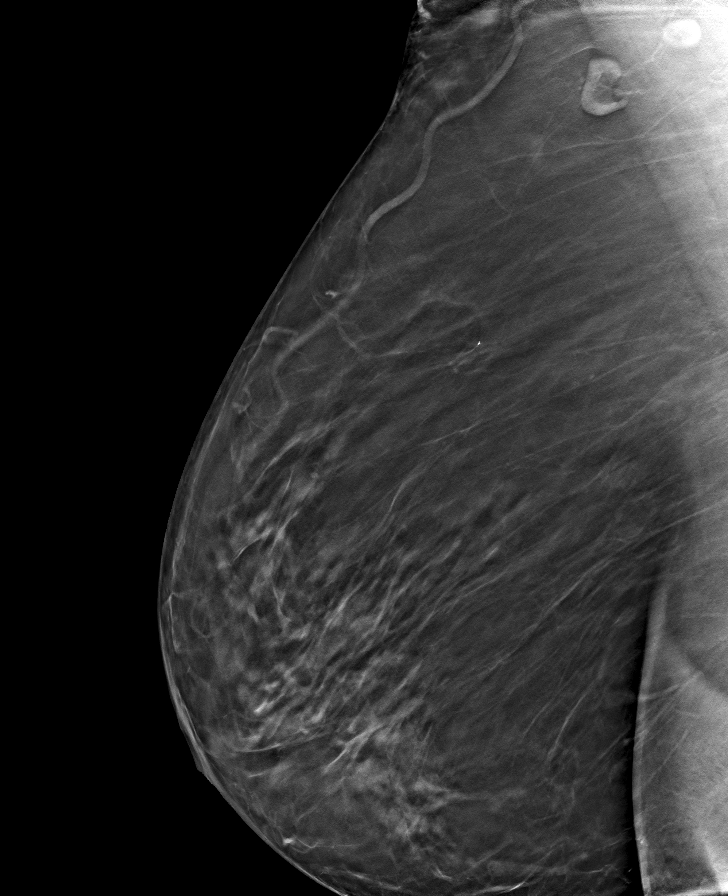

[8 of 40 positions shown; findings below may reference images not displayed]

ACR Breast Density Category b: There are scattered areas of
fibroglandular density.
FINDINGS: There are no findings suspicious for malignancy. Images were
processed with CAD.
IMPRESSION: No mammographic evidence of malignancy. A result letter of this
screening mammogram will be mailed directly to the patient.

RECOMMENDATION:
Screening mammogram in one year. (Code:[TQ])

BI-RADS CATEGORY  1: Negative.

## 2020-08-05 ENCOUNTER — Other Ambulatory Visit: Payer: Self-pay

## 2020-08-05 ENCOUNTER — Encounter (INDEPENDENT_AMBULATORY_CARE_PROVIDER_SITE_OTHER): Payer: Medicare Other | Admitting: Ophthalmology

## 2020-08-05 DIAGNOSIS — E11311 Type 2 diabetes mellitus with unspecified diabetic retinopathy with macular edema: Secondary | ICD-10-CM

## 2020-08-05 DIAGNOSIS — H35033 Hypertensive retinopathy, bilateral: Secondary | ICD-10-CM

## 2020-08-05 DIAGNOSIS — H4312 Vitreous hemorrhage, left eye: Secondary | ICD-10-CM | POA: Diagnosis not present

## 2020-08-05 DIAGNOSIS — E113511 Type 2 diabetes mellitus with proliferative diabetic retinopathy with macular edema, right eye: Secondary | ICD-10-CM | POA: Diagnosis not present

## 2020-08-05 DIAGNOSIS — E113592 Type 2 diabetes mellitus with proliferative diabetic retinopathy without macular edema, left eye: Secondary | ICD-10-CM

## 2020-08-05 DIAGNOSIS — I10 Essential (primary) hypertension: Secondary | ICD-10-CM

## 2020-08-06 DIAGNOSIS — E1165 Type 2 diabetes mellitus with hyperglycemia: Secondary | ICD-10-CM | POA: Diagnosis not present

## 2020-08-06 DIAGNOSIS — E11319 Type 2 diabetes mellitus with unspecified diabetic retinopathy without macular edema: Secondary | ICD-10-CM | POA: Diagnosis not present

## 2020-08-06 DIAGNOSIS — I10 Essential (primary) hypertension: Secondary | ICD-10-CM | POA: Diagnosis not present

## 2020-08-06 DIAGNOSIS — E7800 Pure hypercholesterolemia, unspecified: Secondary | ICD-10-CM | POA: Diagnosis not present

## 2020-08-06 DIAGNOSIS — N189 Chronic kidney disease, unspecified: Secondary | ICD-10-CM | POA: Diagnosis not present

## 2020-08-12 ENCOUNTER — Ambulatory Visit (INDEPENDENT_AMBULATORY_CARE_PROVIDER_SITE_OTHER): Payer: Medicare Other | Admitting: Internal Medicine

## 2020-08-12 ENCOUNTER — Other Ambulatory Visit: Payer: Self-pay

## 2020-08-12 ENCOUNTER — Encounter: Payer: Self-pay | Admitting: Internal Medicine

## 2020-08-12 ENCOUNTER — Ambulatory Visit (INDEPENDENT_AMBULATORY_CARE_PROVIDER_SITE_OTHER): Payer: Medicare Other

## 2020-08-12 VITALS — BP 150/82 | HR 80 | Temp 98.1°F | Ht 60.4 in | Wt 207.0 lb

## 2020-08-12 DIAGNOSIS — Z6835 Body mass index (BMI) 35.0-35.9, adult: Secondary | ICD-10-CM

## 2020-08-12 DIAGNOSIS — R400 Somnolence: Secondary | ICD-10-CM

## 2020-08-12 DIAGNOSIS — N1832 Chronic kidney disease, stage 3b: Secondary | ICD-10-CM

## 2020-08-12 DIAGNOSIS — Z79899 Other long term (current) drug therapy: Secondary | ICD-10-CM

## 2020-08-12 DIAGNOSIS — Z23 Encounter for immunization: Secondary | ICD-10-CM | POA: Diagnosis not present

## 2020-08-12 DIAGNOSIS — I1310 Hypertensive heart and chronic kidney disease without heart failure, with stage 1 through stage 4 chronic kidney disease, or unspecified chronic kidney disease: Secondary | ICD-10-CM

## 2020-08-12 DIAGNOSIS — E1122 Type 2 diabetes mellitus with diabetic chronic kidney disease: Secondary | ICD-10-CM | POA: Diagnosis not present

## 2020-08-12 DIAGNOSIS — Z0000 Encounter for general adult medical examination without abnormal findings: Secondary | ICD-10-CM

## 2020-08-12 DIAGNOSIS — I2510 Atherosclerotic heart disease of native coronary artery without angina pectoris: Secondary | ICD-10-CM | POA: Diagnosis not present

## 2020-08-12 DIAGNOSIS — E7800 Pure hypercholesterolemia, unspecified: Secondary | ICD-10-CM | POA: Diagnosis not present

## 2020-08-12 DIAGNOSIS — Z794 Long term (current) use of insulin: Secondary | ICD-10-CM | POA: Diagnosis not present

## 2020-08-12 MED ORDER — NOVOLOG MIX 70/30 FLEXPEN (70-30) 100 UNIT/ML ~~LOC~~ SUPN
30.0000 [IU] | PEN_INJECTOR | Freq: Two times a day (BID) | SUBCUTANEOUS | 0 refills | Status: DC
Start: 2020-08-12 — End: 2021-03-23

## 2020-08-12 MED ORDER — SYMBICORT 160-4.5 MCG/ACT IN AERO
2.0000 | INHALATION_SPRAY | Freq: Two times a day (BID) | RESPIRATORY_TRACT | 5 refills | Status: DC
Start: 2020-08-12 — End: 2021-02-26

## 2020-08-12 NOTE — Progress Notes (Signed)
This visit occurred during the SARS-CoV-2 public health emergency.  Safety protocols were in place, including screening questions prior to the visit, additional usage of staff PPE, and extensive cleaning of exam room while observing appropriate contact time as indicated for disinfecting solutions.  Subjective:   Carolyn Russo is a 69 y.o. female who presents for Medicare Annual (Subsequent) preventive examination.  Review of Systems     Cardiac Risk Factors include: advanced age (>73men, >47 women);diabetes mellitus;hypertension;obesity (BMI >30kg/m2)     Objective:    Today's Vitals   08/12/20 1537  BP: (!) 150/82  Pulse: 80  Temp: 98.1 F (36.7 C)  TempSrc: Oral  SpO2: 99%  Weight: 207 lb (93.9 kg)  Height: 5' 0.4" (1.534 m)   Body mass index is 39.89 kg/m.  Advanced Directives 08/12/2020 03/27/2019 03/19/2019 01/02/2019 04/10/2018 04/10/2018 04/04/2017  Does Patient Have a Medical Advance Directive? Yes No No No No No No  Type of Paramedic of [place];Living will - - - - - -  Does patient want to make changes to medical advance directive? - - Yes (MAU/Ambulatory/Procedural Areas - Information given) - - - -  Copy of [place] in Chart? No - copy requested - - - - - -  Would patient like information on creating a medical advance directive? - No - Patient declined - Yes (MAU/Ambulatory/Procedural Areas - Information given) - - Yes (MAU/Ambulatory/Procedural Areas - Information given)    Current Medications (verified) Outpatient Encounter Medications as of 08/12/2020  Medication Sig  . albuterol (PROVENTIL HFA;VENTOLIN HFA) 108 (90 BASE) MCG/ACT inhaler Inhale 2 puffs into the lungs every 4 (four) hours as needed for wheezing or shortness of breath.  Marland Kitchen aspirin 81 MG tablet Take 81 mg by mouth daily.  Marland Kitchen b complex vitamins capsule Take 1 capsule by mouth daily.  Marland Kitchen BESIVANCE 0.6 % SUSP INSTILL ONE GTT IN RIGHT EYE QID FOR 2 DAYS AFTER EACH  MONTHLY EYE INJECTION  . Cholecalciferol (VITAMIN D3 PO) Take 2,000 Int'l Units by mouth daily.  . Continuous Blood Gluc Receiver (DEXCOM G6 RECEIVER) DEVI Use as directed to check blood sugars 3 times per day dx: e11.65  . Continuous Blood Gluc Sensor (DEXCOM G6 SENSOR) MISC Use as directed to check blood sugars 3 times per day dx: e11.65  . Continuous Blood Gluc Transmit (DEXCOM G6 TRANSMITTER) MISC Use as directed to check blood sugars 3 times per day dx: e11.65  . CVS CINNAMON PO Take 250 mg by mouth 2 (two) times daily.   . diclofenac Sodium (VOLTAREN) 1 % GEL Apply topically 4 (four) times daily.  . fluticasone (FLONASE) 50 MCG/ACT nasal spray Place 2 sprays into the nose daily as needed for allergies.   . furosemide (LASIX) 40 MG tablet Take 1/2 to 1 tablet by mouth daily  . glucose blood (ACCU-CHEK GUIDE) test strip Use as instructed to check blood sugars 2 times per day dx: e11.65  . lactobacillus acidophilus (BACID) TABS Take 1 tablet by mouth daily. PROBIOTIC  . magnesium oxide (MAG-OX) 400 (241.3 Mg) MG tablet Take 1 tablet by mouth daily.  . milk thistle 175 MG tablet Take 525 mg by mouth daily. Takes 3 daily  . OneTouch Delica Lancets 63W MISC Use as directed to check blood sugars before breakfast and dinner dx: e11.65  . polyethylene glycol (MIRALAX / GLYCOLAX) 17 g packet Take 17 g by mouth daily.  . Polyvinyl Alcohol-Povidone (REFRESH OP) Apply to eye.  . rosuvastatin (  CRESTOR) 5 MG tablet TAKE 1 TABLET(5 MG) BY MOUTH DAILY  . valACYclovir (VALTREX) 1000 MG tablet valacyclovir 1 gram tablet  . valsartan-hydrochlorothiazide (DIOVAN-HCT) 320-12.5 MG tablet Take 1 tablet by mouth daily.  . [DISCONTINUED] insulin aspart protamine - aspart (NOVOLOG MIX 70/30 FLEXPEN) (70-30) 100 UNIT/ML FlexPen 10 Units 2 (two) times daily with a meal.   . [DISCONTINUED] SYMBICORT 160-4.5 MCG/ACT inhaler INHALE 2 PUFFS INTO THE LUNGS TWICE DAILY  . [DISCONTINUED] valsartan (DIOVAN) 80 MG tablet Take  1 tablet (80 mg total) by mouth daily.   No facility-administered encounter medications on file as of 08/12/2020.    Allergies (verified) Food, Other, Wheat bran, Statins, Iodine, Shellfish allergy, Tetracycline, Tetracyclines & related, and Contrast media [iodinated diagnostic agents]   History: Past Medical History:  Diagnosis Date  . Allergy   . Anterior chest wall pain   . Appendicitis 1965  . Asthma   . Body mass index 37.0-37.9, adult   . Breast pain   . Cataract    both eyes  . Chronic renal disease, stage II   . Dehydration 2014  . Deviated septum 1971  . Diabetes mellitus   . Dyspnea 2014  . Extrinsic asthma    WITH ASTHMA ATTACK  . Fibroid 1980  . GERD (gastroesophageal reflux disease)   . Heart murmur   . Hx gestational diabetes   . Hyperlipidemia   . Hypertension 2014  . Inguinal hernia 1959  . Malaise and fatigue 2014  . Nephropathy   . Non-IgE mediated allergic asthma 2014  . Obesity   . Pelvic pain   . Pregnancy, high-risk 1985  . Tonsillitis 1968  . Uterine fibroid 1980   Past Surgical History:  Procedure Laterality Date  . APPENDECTOMY  1959  . CESAREAN SECTION  1985  . COLONOSCOPY    . EYE SURGERY     bilateral cataract   . HERNIA REPAIR  1959  . LEFT HEART CATH AND CORONARY ANGIOGRAPHY N/A 04/04/2017   Procedure: Left Heart Cath and Coronary Angiography;  Surgeon: Belva Crome, MD;  Location: [facility];  Service: Cardiovascular;  Laterality: N/A;  . [place], 2004, 2007  . RHINOPLASTY  1971  . ROTATOR CUFF REPAIR  2003  . SURGICAL REPAIR OF HEMORRHAGE  2015  . TONSILLECTOMY  1968   Family History  Problem Relation Age of Onset  . Cancer Mother        abdominal melamona  . Psoriasis Mother   . Alzheimer's disease Father   . Cancer Cousin        colon   . Diabetes Cousin   . Diabetes Maternal Aunt   . Colon cancer Neg Hx   . Colon polyps Neg Hx   . Esophageal cancer Neg Hx   . Rectal cancer Neg Hx   . Stomach  cancer Neg Hx   . Breast cancer Neg Hx    Social History   Socioeconomic History  . Marital status: Married    Spouse name: Not on file  . Number of children: 1  . Years of education: Not on file  . Highest education level: Professional school degree (e.g., MD, DDS, DVM, JD)  Occupational History  . Occupation: retired  Tobacco Use  . Smoking status: Never Smoker  . Smokeless tobacco: Never Used  Vaping Use  . Vaping Use: Never used  Substance and Sexual Activity  . Alcohol use: No  . Drug use: No  . Sexual activity: Yes  Birth control/protection: Post-menopausal  Other Topics Concern  . Not on file  Social History Narrative   Patient reports she used to walk at [employer] but now due to Covid-19 she is staying inside.   Social Determinants of Health   Financial Resource Strain: Low Risk   . Difficulty of Paying Living Expenses: Not hard at all  Food Insecurity: No Food Insecurity  . Worried About Charity fundraiser in the Last Year: Never true  . Ran Out of Food in the Last Year: Never true  Transportation Needs: No Transportation Needs  . Lack of Transportation (Medical): No  . Lack of Transportation (Non-Medical): No  Physical Activity: Sufficiently Active  . Days of Exercise per Week: 4 days  . Minutes of Exercise per Session: 60 min  Stress: Stress Concern Present  . Feeling of Stress : To some extent  Social Connections:   . Frequency of Communication with Friends and Family: Not on file  . Frequency of Social Gatherings with Friends and Family: Not on file  . Attends Religious Services: Not on file  . Active Member of Clubs or Organizations: Not on file  . Attends Archivist Meetings: Not on file  . Marital Status: Not on file    Tobacco Counseling Counseling given: Not Answered   Clinical Intake:  Pre-visit preparation completed: Yes  Pain : No/denies pain     Nutritional Status: BMI > 30  Obese Nutritional Risks: None Diabetes:  Yes  How often do you need to have someone help you when you read instructions, pamphlets, or other written materials from your doctor or pharmacy?: 1 - Never What is the last grade level you completed in school?: PhD  Diabetic? Yes Nutrition Risk Assessment:  Has the patient had any N/V/D within the last 2 months?  No  Does the patient have any non-healing wounds?  No  Has the patient had any unintentional weight loss or weight gain?  No   Diabetes:  Is the patient diabetic?  Yes  If diabetic, was a CBG obtained today?  No  Did the patient bring in their glucometer from home?  No  How often do you monitor your CBG's? 2-3 daily.   Financial Strains and Diabetes Management:  Are you having any financial strains with the device, your supplies or your medication? No .  Does the patient want to be seen by Chronic Care Management for management of their diabetes?  No  Would the patient like to be referred to a Nutritionist or for Diabetic Management?  No   Diabetic Exams:  Diabetic Eye Exam: Completed 11/18/2019 Diabetic Foot Exam: Completed today   Interpreter Needed?: No  Information entered by :: NAllen LPN   Activities of Daily Living In your present state of health, do you have any difficulty performing the following activities: 08/12/2020 04/07/2020  Hearing? Y N  Vision? N N  Difficulty concentrating or making decisions? N N  Walking or climbing stairs? N Y  Dressing or bathing? N N  Doing errands, shopping? N N  Preparing Food and eating ? N -  Using the Toilet? N -  In the past six months, have you accidently leaked urine? Y -  Do you have problems with loss of bowel control? N -  Managing your Medications? N -  Managing your Finances? N -  Housekeeping or managing your Housekeeping? N -  Some recent data might be hidden    Patient Care Team: [name], MD as  PCP - General (Internal Medicine) Belva Crome, MD as PCP - Cardiology (Cardiology) [place],  Claudette Stapler, RN as Case Manager  Indicate any recent Medical Services you may have received from other than [facility] providers in the past year (date may be approximate).     Assessment:   This is a routine wellness examination for Christabel.  Hearing/Vision screen  Hearing Screening   125Hz  250Hz  500Hz  1000Hz  2000Hz  3000Hz  4000Hz  6000Hz  8000Hz   Right ear:           Left ear:           Vision Screening Comments: Regular eye exams, Dr. Zigmund Daniel  Dietary issues and exercise activities discussed: Current Exercise Habits: Home exercise routine, Type of exercise: walking, Time (Minutes): 60, Frequency (Times/Week): 4, Weekly Exercise (Minutes/Week): 240  Goals    . HEMOGLOBIN A1C < 7.0     Wants to get A1C down    . Patient Stated     08/12/2020, wants to lose 25 pounds      Depression Screen PHQ 2/9 Scores 08/12/2020 06/18/2019 03/27/2019 03/19/2019 01/09/2019 01/01/2019 10/10/2018  PHQ - 2 Score 0 0 0 0 0 0 0  PHQ- 9 Score - - 0 - - - -    Fall Risk Fall Risk  08/12/2020 10/16/2019 06/18/2019 03/27/2019 03/27/2019  Falls in the past year? 0 0 0 0 0  Comment - - - - -  Number falls in past yr: - - - - -  Comment - - - - -  Injury with Fall? - - - - -  Risk for fall due to : Impaired balance/gait;Medication side effect - - Medication side effect -  Follow up Falls evaluation completed;Education provided;Falls prevention discussed - - - -    Any stairs in or around the home? Yes  If so, are there any without handrails? No  Home free of loose throw rugs in walkways, pet beds, electrical cords, etc? Yes  Adequate lighting in your home to reduce risk of falls? Yes   ASSISTIVE DEVICES UTILIZED TO PREVENT FALLS:  Life alert? No  Use of a cane, walker or w/c? Yes  Grab bars in the bathroom? No  Shower chair or bench in shower? Yes  Elevated toilet seat or a handicapped toilet? No   TIMED UP AND GO:  Was the test performed? No .    Gait slow and steady with assistive device  Cognitive  Function:     6CIT Screen 08/12/2020 03/27/2019  What Year? 0 points 0 points  What month? 0 points 0 points  What time? 0 points 3 points  Count back from 20 0 points 0 points  Months in reverse 0 points 0 points  Repeat phrase 10 points 0 points  Total Score 10 3    Immunizations Immunization History  Administered Date(s) Administered  . Fluad Quad(high Dose 65+) 08/12/2020  . Influenza, High Dose Seasonal PF 08/16/2018, 07/16/2019  . PFIZER SARS-COV-2 Vaccination 12/14/2019, 01/07/2020  . Tdap 07/23/2008    TDAP status: Up to date Flu Vaccine status: Completed at today's visit Pneumococcal vaccine status: Up to date Covid-19 vaccine status: Completed vaccines  Qualifies for Shingles Vaccine? Yes   Zostavax completed No   Shingrix Completed?: No.    Education has been provided regarding the importance of this vaccine. Patient has been advised to call insurance company to determine out of pocket expense if they have not yet received this vaccine. Advised may also receive vaccine at local pharmacy or  Health Dept. Verbalized acceptance and understanding.  Screening Tests Health Maintenance  Topic Date Due  . DEXA SCAN  Never done  . TETANUS/TDAP  07/23/2018  . OPHTHALMOLOGY EXAM  11/17/2020  . HEMOGLOBIN A1C  12/20/2020  . COLONOSCOPY  04/10/2021  . FOOT EXAM  08/12/2021  . MAMMOGRAM  08/03/2022  . INFLUENZA VACCINE  Completed  . COVID-19 Vaccine  Completed  . Hepatitis C Screening  Completed  . PNA vac Low Risk Adult  Completed    Health Maintenance  Health Maintenance Due  Topic Date Due  . DEXA SCAN  Never done  . TETANUS/TDAP  07/23/2018    Colorectal cancer screening: Completed 04/10/2018. Repeat every 10 years Mammogram status: Completed 08/03/2020. Repeat every year Bone Density status: completed per patient  Lung Cancer Screening: (Low Dose CT Chest recommended if Age 74-80 years, 30 pack-year currently smoking OR have quit w/in 15years.) does not  qualify.   Lung Cancer Screening Referral: no  Additional Screening:  Hepatitis C Screening: does qualify; Completed 01/01/2013   Vision Screening: Recommended annual ophthalmology exams for early detection of glaucoma and other disorders of the eye. Is the patient up to date with their annual eye exam?  No  Who is the provider or what is the name of the office in which the patient attends annual eye exams? Dr. Zigmund Daniel If pt is not established with a provider, would they like to be referred to a provider to establish care? No .   Dental Screening: Recommended annual dental exams for proper oral hygiene  Community Resource Referral / Chronic Care Management: CRR required this visit?  No   CCM required this visit?  No      Plan:     I have personally reviewed and noted the following in the patient's chart:   . Medical and social history . Use of alcohol, tobacco or illicit drugs  . Current medications and supplements . Functional ability and status . Nutritional status . Physical activity . Advanced directives . List of other physicians . Hospitalizations, surgeries, and ER visits in previous 12 months . Vitals . Screenings to include cognitive, depression, and falls . Referrals and appointments  In addition, I have reviewed and discussed with patient certain preventive protocols, quality metrics, and best practice recommendations. A written personalized care plan for preventive services as well as general preventive health recommendations were provided to patient.     Kellie Simmering, LPN   [id number]   Nurse Notes:

## 2020-08-12 NOTE — Progress Notes (Signed)
I,Katawbba Wiggins,acting as a Education administrator for Maximino Greenland, MD.,have documented all relevant documentation on the behalf of Maximino Greenland, MD,as directed by  Maximino Greenland, MD while in the presence of Maximino Greenland, MD.  This visit occurred during the SARS-CoV-2 public health emergency.  Safety protocols were in place, including screening questions prior to the visit, additional usage of staff PPE, and extensive cleaning of exam room while observing appropriate contact time as indicated for disinfecting solutions.  Subjective:     Patient ID: Carolyn Russo , female    DOB: 1951/05/23 , 69 y.o.   MRN: [id number]   Chief Complaint  Patient presents with  . Diabetes  . Hypertension    HPI  She presents today for f/u diabetes/HTN. Also followed by Dr. Chalmers Cater. She was seen last about two weeks ago and reports her a1c is 9.0%. She reports compliance with meds. She had AWV performed by [facility] today as well. [facility] Advisor reports that pt fell asleep several times during the visit.   Diabetes She presents for her follow-up diabetic visit. She has type 2 diabetes mellitus. Pertinent negatives for hypoglycemia include no headaches. Associated symptoms include fatigue. Pertinent negatives for diabetes include no chest pain. She participates in exercise intermittently. Eye exam is current.  Hypertension This is a chronic problem. The current episode started more than 1 year ago. The problem has been gradually improving since onset. The problem is uncontrolled. Pertinent negatives include no chest pain, headaches, palpitations or shortness of breath. Risk factors for coronary artery disease include diabetes mellitus, dyslipidemia, obesity, post-menopausal state and sedentary lifestyle. The current treatment provides moderate improvement. Compliance problems include exercise.  Hypertensive end-organ damage includes kidney disease.     Past Medical History:  Diagnosis Date  . Allergy   . Anterior  chest wall pain   . Appendicitis 1965  . Asthma   . Body mass index 37.0-37.9, adult   . Breast pain   . Cataract    both eyes  . Chronic renal disease, stage II   . Dehydration 2014  . Deviated septum 1971  . Diabetes mellitus   . Dyspnea 2014  . Extrinsic asthma    WITH ASTHMA ATTACK  . Fibroid 1980  . GERD (gastroesophageal reflux disease)   . Heart murmur   . Hx gestational diabetes   . Hyperlipidemia   . Hypertension 2014  . Inguinal hernia 1959  . Malaise and fatigue 2014  . Nephropathy   . Non-IgE mediated allergic asthma 2014  . Obesity   . Pelvic pain   . Pregnancy, high-risk 1985  . Tonsillitis 1968  . Uterine fibroid 1980     Family History  Problem Relation Age of Onset  . Cancer Mother        abdominal melamona  . Psoriasis Mother   . Alzheimer's disease Father   . Cancer Cousin        colon   . Diabetes Cousin   . Diabetes Maternal Aunt   . Colon cancer Neg Hx   . Colon polyps Neg Hx   . Esophageal cancer Neg Hx   . Rectal cancer Neg Hx   . Stomach cancer Neg Hx   . Breast cancer Neg Hx      Current Outpatient Medications:  .  albuterol (PROVENTIL HFA;VENTOLIN HFA) 108 (90 BASE) MCG/ACT inhaler, Inhale 2 puffs into the lungs every 4 (four) hours as needed for wheezing or shortness of breath., Disp: 1 Inhaler, Rfl:  3 .  aspirin 81 MG tablet, Take 81 mg by mouth daily., Disp: , Rfl:  .  b complex vitamins capsule, Take 1 capsule by mouth daily., Disp: , Rfl:  .  BESIVANCE 0.6 % SUSP, INSTILL ONE GTT IN RIGHT EYE QID FOR 2 DAYS AFTER EACH MONTHLY EYE INJECTION, Disp: , Rfl:  .  calcitRIOL (ROCALTROL) 0.25 MCG capsule, Take 0.25 mcg by mouth 3 (three) times a week., Disp: , Rfl:  .  Cholecalciferol (VITAMIN D3 PO), Take 2,000 Int'l Units by mouth daily., Disp: , Rfl:  .  Continuous Blood Gluc Receiver (DEXCOM G6 RECEIVER) DEVI, Use as directed to check blood sugars 3 times per day dx: e11.65, Disp: 1 each, Rfl: 1 .  Continuous Blood Gluc Sensor  (DEXCOM G6 SENSOR) MISC, Use as directed to check blood sugars 3 times per day dx: e11.65, Disp: 3 each, Rfl: 2 .  Continuous Blood Gluc Transmit (DEXCOM G6 TRANSMITTER) MISC, Use as directed to check blood sugars 3 times per day dx: e11.65, Disp: 1 each, Rfl: 1 .  [employer] CINNAMON PO, Take 250 mg by mouth 2 (two) times daily. , Disp: , Rfl:  .  diclofenac Sodium (VOLTAREN) 1 % GEL, Apply topically 4 (four) times daily., Disp: , Rfl:  .  fluticasone (FLONASE) 50 MCG/ACT nasal spray, Place 2 sprays into the nose daily as needed for allergies. , Disp: , Rfl:  .  furosemide (LASIX) 40 MG tablet, Take 1/2 to 1 tablet by mouth daily, Disp: , Rfl:  .  glucose blood (ACCU-CHEK GUIDE) test strip, Use as instructed to check blood sugars 2 times per day dx: e11.65, Disp: 100 each, Rfl: 11 .  insulin aspart protamine - aspart (NOVOLOG MIX 70/30 FLEXPEN) (70-30) 100 UNIT/ML FlexPen, Inject 0.3 mLs (30 Units total) into the skin 2 (two) times daily with a meal., Disp: 15 mL, Rfl: 0 .  lactobacillus acidophilus (BACID) TABS, Take 1 tablet by mouth daily. PROBIOTIC, Disp: , Rfl:  .  magnesium oxide (MAG-OX) 400 (241.3 Mg) MG tablet, Take 1 tablet by mouth daily., Disp: , Rfl:  .  milk thistle 175 MG tablet, Take 525 mg by mouth daily. Takes 3 daily, Disp: , Rfl:  .  ondansetron (ZOFRAN) 4 MG tablet, Take 4 mg by mouth at bedtime., Disp: , Rfl:  .  OneTouch Delica Lancets 40J MISC, Use as directed to check blood sugars before breakfast and dinner dx: e11.65, Disp: 100 each, Rfl: 11 .  polyethylene glycol (MIRALAX / GLYCOLAX) 17 g packet, Take 17 g by mouth daily., Disp: 14 each, Rfl: 0 .  Polyvinyl Alcohol-Povidone (REFRESH OP), Apply to eye., Disp: , Rfl:  .  rosuvastatin (CRESTOR) 5 MG tablet, TAKE 1 TABLET(5 MG) BY MOUTH DAILY, Disp: 90 tablet, Rfl: 0 .  SYMBICORT 160-4.5 MCG/ACT inhaler, Inhale 2 puffs into the lungs 2 (two) times daily., Disp: 10.2 g, Rfl: 5 .  valACYclovir (VALTREX) 1000 MG tablet, valacyclovir 1  gram tablet, Disp: , Rfl:  .  valsartan-hydrochlorothiazide (DIOVAN-HCT) 320-12.5 MG tablet, Take 1 tablet by mouth daily., Disp: 90 tablet, Rfl: 3   Allergies  Allergen Reactions  . Food Anaphylaxis    Peanuts; Almonds  . Other Shortness Of Breath    Peanuts; Almonds  . Wheat Bran Shortness Of Breath  . Statins Itching and Other (See Comments)    Generalized aches  . Iodine Other (See Comments)  . Shellfish Allergy Other (See Comments)    Mouth gets raw  . Tetracycline Other (  See Comments)    Raw mouth  . Tetracyclines & Related Itching  . Contrast Media [Iodinated Diagnostic Agents] Rash    Happened during CT scan over 30 years ago     Review of Systems  Constitutional: Positive for fatigue.       She admits to daytime somnolence. Also with nocturia, fatigue. Does not always feel rested upon awakening.   Respiratory: Negative.  Negative for shortness of breath.   Cardiovascular: Negative.  Negative for chest pain and palpitations.  Gastrointestinal: Negative.   Neurological: Negative.  Negative for headaches.  Psychiatric/Behavioral: Negative.      Today's Vitals   08/12/20 1540  BP: (!) 150/82  Pulse: 80  Temp: 98.1 F (36.7 C)  TempSrc: Oral  Weight: 207 lb (93.9 kg)  Height: 5' 0.4" (1.534 m)   Body mass index is 39.89 kg/m.   Objective:  Physical Exam Vitals and nursing note reviewed.  Constitutional:      Appearance: Normal appearance.  HENT:     Head: Normocephalic and atraumatic.  Cardiovascular:     Rate and Rhythm: Normal rate and regular rhythm.     Heart sounds: Normal heart sounds.  Pulmonary:     Effort: Pulmonary effort is normal.     Breath sounds: Normal breath sounds.  Musculoskeletal:     Right lower leg: Edema present.     Left lower leg: Edema present.  Skin:    General: Skin is warm.  Neurological:     General: No focal deficit present.     Mental Status: She is alert.  Psychiatric:        Mood and Affect: Mood normal.         Behavior: Behavior normal.         Assessment And Plan:     1. Type 2 diabetes mellitus with stage 3b chronic kidney disease, with long-term current use of insulin (HCC) Comments: Chronic, I will request her records from Dr. Chalmers Cater.  - Lipid panel - Iron and IBC ([id number])  2. Hypertensive heart and renal disease with renal failure, stage 1 through stage 4 or unspecified chronic kidney disease, without heart failure Comments: Chronic, uncontrolled. States BP is up b/c [place] and [place] to her appt. She will c/w current meds for now.   3. Pure hypercholesterolemia Comments: Chronic. Importance of statin compliance was discussed with the patient. Encouraged to follow heart healthy lifestyle.   4. Daytime somnolence Comments: She agrees to Neuro eval for sleep study. Pt does have sx suggestive of OSA, her husband thinks she could have narcolepsy. (He was contacted during her visit) - Ambulatory referral to Neurology  5. Class 2 severe obesity due to excess calories with serious comorbidity and body mass index (BMI) of 35.0 to 35.9 in adult [facility]) Comments: She is encouraged to strive for BMI less than 30 to decrease cardiac risk. Advised to increase her daily activity level.   6. Drug therapy - Liver Profile - CBC no Diff     Patient was given opportunity to ask questions. Patient verbalized understanding of the plan and was able to repeat key elements of the plan. All questions were answered to their satisfaction.  Maximino Greenland, MD   I, Maximino Greenland, MD, have reviewed all documentation for this visit. The documentation on 08/16/20 for the exam, diagnosis, procedures, and orders are all accurate and complete.  THE PATIENT IS ENCOURAGED TO PRACTICE SOCIAL DISTANCING DUE TO THE COVID-19  PANDEMIC.

## 2020-08-12 NOTE — Patient Instructions (Signed)
Carolyn Russo , Thank you for taking time to come for your Medicare Wellness Visit. I appreciate your ongoing commitment to your health goals. Please review the following plan we discussed and let me know if I can assist you in the future.   Screening recommendations/referrals: Colonoscopy: completed 04/10/2018 Mammogram: completed 08/03/2020 Bone Density: completed per patient Recommended yearly ophthalmology/optometry visit for glaucoma screening and checkup Recommended yearly dental visit for hygiene and checkup  Vaccinations: Influenza vaccine: today Pneumococcal vaccine: completed 09/08/2016 Tdap vaccine: states had Shingles vaccine: discussed   Covid-19: 01/07/2020, 12/14/2019  Advanced directives: Please bring a copy of your POA (Power of Attorney) and/or Living Will to your next appointment.   Conditions/risks identified: none  Next appointment: Follow up in one year for your annual wellness visit    Preventive Care 65 Years and Older, Female Preventive care refers to lifestyle choices and visits with your health care provider that can promote health and wellness. What does preventive care include?  A yearly physical exam. This is also called an annual well check.  Dental exams once or twice a year.  Routine eye exams. Ask your health care provider how often you should have your eyes checked.  Personal lifestyle choices, including:  Daily care of your teeth and gums.  Regular physical activity.  Eating a healthy diet.  Avoiding tobacco and drug use.  Limiting alcohol use.  Practicing safe sex.  Taking low-dose aspirin every day.  Taking vitamin and mineral supplements as recommended by your health care provider. What happens during an annual well check? The services and screenings done by your health care provider during your annual well check will depend on your age, overall health, lifestyle risk factors, and family history of disease. Counseling  Your  health care provider may ask you questions about your:  Alcohol use.  Tobacco use.  Drug use.  Emotional well-being.  Home and relationship well-being.  Sexual activity.  Eating habits.  History of falls.  Memory and ability to understand (cognition).  Work and work Statistician.  Reproductive health. Screening  You may have the following tests or measurements:  Height, weight, and BMI.  Blood pressure.  Lipid and cholesterol levels. These may be checked every 5 years, or more frequently if you are over 31 years old.  Skin check.  Lung cancer screening. You may have this screening every year starting at age 35 if you have a 30-pack-year history of smoking and currently smoke or have quit within the past 15 years.  Fecal occult blood test (FOBT) of the stool. You may have this test every year starting at age 65.  Flexible sigmoidoscopy or colonoscopy. You may have a sigmoidoscopy every 5 years or a colonoscopy every 10 years starting at age 59.  Hepatitis C blood test.  Hepatitis B blood test.  Sexually transmitted disease (STD) testing.  Diabetes screening. This is done by checking your blood sugar (glucose) after you have not eaten for a while (fasting). You may have this done every 1-3 years.  Bone density scan. This is done to screen for osteoporosis. You may have this done starting at age 65.  Mammogram. This may be done every 1-2 years. Talk to your health care provider about how often you should have regular mammograms. Talk with your health care provider about your test results, treatment options, and if necessary, the need for more tests. Vaccines  Your health care provider may recommend certain vaccines, such as:  Influenza vaccine. This is recommended every  year.  Tetanus, diphtheria, and acellular pertussis (Tdap, Td) vaccine. You may need a Td booster every 10 years.  Zoster vaccine. You may need this after age 20.  Pneumococcal 13-valent  conjugate (PCV13) vaccine. One dose is recommended after age 63.  Pneumococcal polysaccharide (PPSV23) vaccine. One dose is recommended after age 62. Talk to your health care provider about which screenings and vaccines you need and how often you need them. This information is not intended to replace advice given to you by your health care provider. Make sure you discuss any questions you have with your health care provider. Document Released: 11/06/2015 Document Revised: 06/29/2016 Document Reviewed: 08/11/2015 Elsevier Interactive Patient Education  2017 [place] Prevention in the Home Falls can cause injuries. They can happen to people of all ages. There are many things you can do to make your home safe and to help prevent falls. What can I do on the outside of my home?  Regularly fix the edges of walkways and driveways and fix any cracks.  Remove anything that might make you trip as you walk through a door, such as a raised step or threshold.  Trim any bushes or trees on the path to your home.  Use bright outdoor lighting.  Clear any walking paths of anything that might make someone trip, such as rocks or tools.  Regularly check to see if handrails are loose or broken. Make sure that both sides of any steps have handrails.  Any raised decks and porches should have guardrails on the edges.  Have any leaves, snow, or ice cleared regularly.  Use sand or salt on walking paths during winter.  Clean up any spills in your garage right away. This includes oil or grease spills. What can I do in the bathroom?  Use night lights.  Install grab bars by the toilet and in the tub and shower. Do not use towel bars as grab bars.  Use non-skid mats or decals in the tub or shower.  If you need to sit down in the shower, use a plastic, non-slip stool.  Keep the floor dry. Clean up any water that spills on the floor as soon as it happens.  Remove soap buildup in the tub or  shower regularly.  Attach bath mats securely with double-sided non-slip rug tape.  Do not have throw rugs and other things on the floor that can make you trip. What can I do in the bedroom?  Use night lights.  Make sure that you have a light by your bed that is easy to reach.  Do not use any sheets or blankets that are too big for your bed. They should not hang down onto the floor.  Have a firm chair that has side arms. You can use this for support while you get dressed.  Do not have throw rugs and other things on the floor that can make you trip. What can I do in the kitchen?  Clean up any spills right away.  Avoid walking on wet floors.  Keep items that you use a lot in easy-to-reach places.  If you need to reach something above you, use a strong step stool that has a grab bar.  Keep electrical cords out of the way.  Do not use floor polish or wax that makes floors slippery. If you must use wax, use non-skid floor wax.  Do not have throw rugs and other things on the floor that can make you trip. What can  I do with my stairs?  Do not leave any items on the stairs.  Make sure that there are handrails on both sides of the stairs and use them. Fix handrails that are broken or loose. Make sure that handrails are as long as the stairways.  Check any carpeting to make sure that it is firmly attached to the stairs. Fix any carpet that is loose or worn.  Avoid having throw rugs at the top or bottom of the stairs. If you do have throw rugs, attach them to the floor with carpet tape.  Make sure that you have a light switch at the top of the stairs and the bottom of the stairs. If you do not have them, ask someone to add them for you. What else can I do to help prevent falls?  Wear shoes that:  Do not have high heels.  Have rubber bottoms.  Are comfortable and fit you well.  Are closed at the toe. Do not wear sandals.  If you use a stepladder:  Make sure that it is fully  opened. Do not climb a closed stepladder.  Make sure that both sides of the stepladder are locked into place.  Ask someone to hold it for you, if possible.  Clearly mark and make sure that you can see:  Any grab bars or handrails.  First and last steps.  Where the edge of each step is.  Use tools that help you move around (mobility aids) if they are needed. These include:  Canes.  Walkers.  Scooters.  Crutches.  Turn on the lights when you go into a dark area. Replace any light bulbs as soon as they burn out.  Set up your furniture so you have a clear path. Avoid moving your furniture around.  If any of your floors are uneven, fix them.  If there are any pets around you, be aware of where they are.  Review your medicines with your doctor. Some medicines can make you feel dizzy. This can increase your chance of falling. Ask your doctor what other things that you can do to help prevent falls. This information is not intended to replace advice given to you by your health care provider. Make sure you discuss any questions you have with your health care provider. Document Released: 08/06/2009 Document Revised: 03/17/2016 Document Reviewed: 11/14/2014 Elsevier Interactive Patient Education  2017 [employer].

## 2020-08-13 LAB — HEPATIC FUNCTION PANEL
ALT: 20 IU/L (ref 0–32)
AST: 16 IU/L (ref 0–40)
Albumin: 3.6 g/dL — ABNORMAL LOW (ref 3.7–4.7)
Alkaline Phosphatase: 84 IU/L (ref 44–121)
Bilirubin Total: 0.4 mg/dL (ref 0.0–1.2)
Bilirubin, Direct: 0.12 mg/dL (ref 0.00–0.40)
Total Protein: 6.4 g/dL (ref 6.0–8.5)

## 2020-08-13 LAB — LIPID PANEL
Chol/HDL Ratio: 4.2 ratio (ref 0.0–4.4)
Cholesterol, Total: 158 mg/dL (ref 100–199)
HDL: 38 mg/dL — ABNORMAL LOW (ref >39)
LDL Chol Calc (NIH): 99 mg/dL (ref 0–99)
Triglycerides: 117 mg/dL (ref 0–149)
VLDL Cholesterol Cal: 21 mg/dL (ref 5–40)

## 2020-08-13 LAB — IRON AND TIBC
Iron Saturation: 29 % (ref 15–55)
Iron: 64 ug/dL (ref 27–139)
Total Iron Binding Capacity: 218 ug/dL — ABNORMAL LOW (ref 250–450)
UIBC: 154 ug/dL (ref 118–369)

## 2020-08-13 LAB — CBC
Hematocrit: 28.2 % — ABNORMAL LOW (ref 34.0–46.6)
Hemoglobin: 9.8 g/dL — ABNORMAL LOW (ref 11.1–15.9)
MCH: 29.7 pg (ref 26.6–33.0)
MCHC: 34.8 g/dL (ref 31.5–35.7)
MCV: 86 fL (ref 79–97)
Platelets: 303 10*3/uL (ref 150–450)
RBC: 3.3 x10E6/uL — ABNORMAL LOW (ref 3.77–5.28)
RDW: 13 % (ref 11.7–15.4)
WBC: 10.8 10*3/uL (ref 3.4–10.8)

## 2020-08-16 NOTE — Patient Instructions (Signed)
Sleep Studies A sleep study (polysomnogram) is a series of tests done while you are sleeping. A sleep study records your brain waves, heart rate, breathing rate, oxygen level, and eye and leg movements. A sleep study helps your health care provider:  See how well you sleep.  Diagnose a sleep disorder.  Determine how severe your sleep disorder is.  Create a plan to treat your sleep disorder. Your health care provider may recommend a sleep study if you:  Feel sleepy on most days.  Snore loudly while sleeping.  Have unusual behaviors while you sleep, such as walking.  Have brief periods in which you stop breathing during sleep (sleepapnea).  Fall asleep suddenly during the day (narcolepsy).  Have trouble falling asleep or staying asleep (insomnia).  Feel like you need to move your legs when trying to fall asleep (restless legs syndrome).  Move your legs by flexing and extending them regularly while asleep (periodic limb movement disorder).  Act out your dreams while you sleep (sleep behavior disorder).  Feel like you cannot move when you first wake up (sleep paralysis). What tests are part of a sleep study? Most sleep studies record the following during sleep:  Brain activity.  Eye movements.  Heart rate and rhythm.  Breathing rate and rhythm.  Blood-oxygen level.  Blood pressure.  Chest and belly movement as you breathe.  Arm and leg movements.  Snoring or other noises.  Body position. Where are sleep studies done? Sleep studies are done at sleep centers. A sleep center may be inside a hospital, office, or clinic. The room where you have the study may look like a hospital room or a hotel room. The health care providers doing the study may come in and out of the room during the study. Most of the time, they will be in another room monitoring your test as you sleep. How are sleep studies done? Most sleep studies are done during a normal period of time for a full  night of sleep. You will arrive at the study center in the evening and go home in the morning. Before the test  Bring your pajamas and toothbrush with you to the sleep study.  Do not have caffeine on the day of your sleep study.  Do not drink alcohol on the day of your sleep study.  Your health care provider will let you know if you should stop taking any of your regular medicines before the test. During the test      Round, sticky patches with sensors attached to recording wires (electrodes) are placed on your scalp, face, chest, and limbs.  Wires from all the electrodes and sensors run from your bed to a computer. The wires can be taken off and put back on if you need to get out of bed to go to the bathroom.  A sensor is placed over your nose to measure airflow.  A finger clip is put on your finger or ear to measure your blood oxygen level (pulse oximetry).  A belt is placed around your belly and a belt is placed around your chest to measure breathing movements.  If you have signs of the sleep disorder called sleep apnea during your test, you may get a treatment mask to wear for the second half of the night. ? The mask provides positive airway pressure (PAP) to help you breathe better during sleep. This may greatly improve your sleep apnea. ? You will then have all tests done again with the mask   in place to see if your measurements and recordings change. After the test  A medical doctor who specializes in sleep will evaluate the results of your sleep study and share them with you and your primary health care provider.  Based on your results, your medical history, and a physical exam, you may be diagnosed with a sleep disorder, such as: ? Sleep apnea. ? Restless legs syndrome. ? Sleep-related behavior disorder. ? Sleep-related movement disorders. ? Sleep-related seizure disorders.  Your health care team will help determine your treatment options based on your diagnosis. This  may include: ? Improving your sleep habits (sleep hygiene). ? Wearing a continuous positive airway pressure (CPAP) or bi-level positive airway pressure (BPAP) mask. ? Wearing an oral device at night to improve breathing and reduce snoring. ? Taking medicines. Follow these instructions at home:  Take over-the-counter and prescription medicines only as told by your health care provider.  If you are instructed to use a CPAP or BPAP mask, make sure you use it nightly as directed.  Make any lifestyle changes that your health care provider recommends.  If you were given a device to open your airway while you sleep, use it only as told by your health care provider.  Do not use any tobacco products, such as cigarettes, chewing tobacco, and e-cigarettes. If you need help quitting, ask your health care provider.  Keep all follow-up visits as told by your health care provider. This is important. Summary  A sleep study (polysomnogram) is a series of tests done while you are sleeping. It shows how well you sleep.  Most sleep studies are done over one full night of sleep. You will arrive at the study center in the evening and go home in the morning.  If you have signs of the sleep disorder called sleep apnea during your test, you may get a treatment mask to wear for the second half of the night.  A medical doctor who specializes in sleep will evaluate the results of your sleep study and share them with your primary health care provider. This information is not intended to replace advice given to you by your health care provider. Make sure you discuss any questions you have with your health care provider. Document Revised: 03/27/2019 Document Reviewed: 11/07/2017 Elsevier Patient Education  2020 [employer]  

## 2020-08-18 ENCOUNTER — Ambulatory Visit (INDEPENDENT_AMBULATORY_CARE_PROVIDER_SITE_OTHER): Payer: Medicare Other | Admitting: Podiatry

## 2020-08-18 ENCOUNTER — Telehealth: Payer: Self-pay

## 2020-08-18 ENCOUNTER — Encounter: Payer: Self-pay | Admitting: Podiatry

## 2020-08-18 ENCOUNTER — Other Ambulatory Visit: Payer: Self-pay

## 2020-08-18 DIAGNOSIS — M79675 Pain in left toe(s): Secondary | ICD-10-CM

## 2020-08-18 DIAGNOSIS — M79674 Pain in right toe(s): Secondary | ICD-10-CM | POA: Diagnosis not present

## 2020-08-18 DIAGNOSIS — M2010 Hallux valgus (acquired), unspecified foot: Secondary | ICD-10-CM

## 2020-08-18 DIAGNOSIS — E119 Type 2 diabetes mellitus without complications: Secondary | ICD-10-CM

## 2020-08-18 DIAGNOSIS — B351 Tinea unguium: Secondary | ICD-10-CM

## 2020-08-18 NOTE — Progress Notes (Signed)
This patient returns to my office for at risk foot care.  This patient requires this care by a professional since this patient will be at risk due to having type 2 diabetes.  This patient is unable to cut nails herself since the patient cannot reach her nails.These nails are painful walking and wearing shoes.  This patient presents for at risk foot care today.  General Appearance  Alert, conversant and in no acute stress.  Vascular  Dorsalis pedis and posterior tibial  pulses are palpable  bilaterally.  Capillary return is within normal limits  bilaterally. Temperature is within normal limits  bilaterally.  Neurologic  Senn-Weinstein monofilament wire test within normal limits  bilaterally. Muscle power within normal limits bilaterally.  Nails Thick disfigured discolored nails with subungual debris  from hallux to fifth toes bilaterally. No evidence of bacterial infection or drainage bilaterally.  Orthopedic  No limitations of motion  feet .  No crepitus or effusions noted.  HAV  B/L.  Hammer toes second  B/L.  Skin  normotropic skin with no porokeratosis noted bilaterally.  No signs of infections or ulcers noted.     Onychomycosis  Pain in right toes  Pain in left toes  Consent was obtained for treatment procedures.   Mechanical debridement of nails 1-5  bilaterally performed with a nail nipper.  Filed with dremel without incident.    Return office visit    10 weeks                Told patient to return for periodic foot care and evaluation due to potential at risk complications.   Gardiner Barefoot DPM

## 2020-08-18 NOTE — Telephone Encounter (Signed)
I left the pt a message to call the office back. 

## 2020-08-18 NOTE — Telephone Encounter (Signed)
-----   Message from [name], MD sent at 08/18/2020  1:57 PM EDT ----- Your HDL, good cholesterol is low. Please increase activity as tolerated. Liver function is normal. Blood count is low at 9.8. You should hear about Neuro appt for sleep study by the end of the week.

## 2020-08-20 ENCOUNTER — Telehealth: Payer: Self-pay | Admitting: Interventional Cardiology

## 2020-08-20 NOTE — Telephone Encounter (Signed)
Spoke with pt's husband as he was calling in with another issue.  He said he believed pt was calling in to see when her appt was.  Provided this information and advised to have her call back if it was another issue.

## 2020-08-20 NOTE — Telephone Encounter (Signed)
Left message to call back  

## 2020-08-20 NOTE — Telephone Encounter (Signed)
Carolyn Russo is calling requesting to speak with a nurse. She refused to give any further information as to what it was in regards to. Please advise.

## 2020-08-21 MED ORDER — VALSARTAN-HYDROCHLOROTHIAZIDE 320-12.5 MG PO TABS
1.0000 | ORAL_TABLET | Freq: Every day | ORAL | 0 refills | Status: DC
Start: 2020-08-21 — End: 2020-12-29

## 2020-08-21 MED ORDER — ROSUVASTATIN CALCIUM 5 MG PO TABS
ORAL_TABLET | ORAL | 0 refills | Status: DC
Start: 2020-08-21 — End: 2020-09-25

## 2020-08-21 NOTE — Telephone Encounter (Signed)
Spoke with pt and she was inquiring about her medications.  States pharmacy has been calling and letting her know that it is almost time to refill meds but she doesn't have any refills remaining.  Advised I will send in Diovan and Crestor and to keep appt. Pt appreciative for call.

## 2020-08-21 NOTE — Telephone Encounter (Signed)
Patient returning call.

## 2020-08-21 NOTE — Addendum Note (Signed)
Addended by: Loren Racer on: 08/21/2020 01:35 PM   Modules accepted: Orders

## 2020-09-01 ENCOUNTER — Other Ambulatory Visit: Payer: Self-pay

## 2020-09-01 ENCOUNTER — Encounter (INDEPENDENT_AMBULATORY_CARE_PROVIDER_SITE_OTHER): Payer: Medicare Other | Admitting: Ophthalmology

## 2020-09-01 DIAGNOSIS — E113511 Type 2 diabetes mellitus with proliferative diabetic retinopathy with macular edema, right eye: Secondary | ICD-10-CM | POA: Diagnosis not present

## 2020-09-01 DIAGNOSIS — H43813 Vitreous degeneration, bilateral: Secondary | ICD-10-CM

## 2020-09-01 DIAGNOSIS — H35371 Puckering of macula, right eye: Secondary | ICD-10-CM | POA: Diagnosis not present

## 2020-09-01 DIAGNOSIS — H35033 Hypertensive retinopathy, bilateral: Secondary | ICD-10-CM | POA: Diagnosis not present

## 2020-09-01 DIAGNOSIS — E11311 Type 2 diabetes mellitus with unspecified diabetic retinopathy with macular edema: Secondary | ICD-10-CM

## 2020-09-01 DIAGNOSIS — I10 Essential (primary) hypertension: Secondary | ICD-10-CM

## 2020-09-01 DIAGNOSIS — E113592 Type 2 diabetes mellitus with proliferative diabetic retinopathy without macular edema, left eye: Secondary | ICD-10-CM

## 2020-09-10 DIAGNOSIS — E1165 Type 2 diabetes mellitus with hyperglycemia: Secondary | ICD-10-CM | POA: Diagnosis not present

## 2020-09-10 DIAGNOSIS — D509 Iron deficiency anemia, unspecified: Secondary | ICD-10-CM | POA: Diagnosis not present

## 2020-09-10 DIAGNOSIS — E669 Obesity, unspecified: Secondary | ICD-10-CM | POA: Diagnosis not present

## 2020-09-10 DIAGNOSIS — E1122 Type 2 diabetes mellitus with diabetic chronic kidney disease: Secondary | ICD-10-CM | POA: Diagnosis not present

## 2020-09-10 DIAGNOSIS — N3281 Overactive bladder: Secondary | ICD-10-CM | POA: Diagnosis not present

## 2020-09-10 DIAGNOSIS — N184 Chronic kidney disease, stage 4 (severe): Secondary | ICD-10-CM | POA: Diagnosis not present

## 2020-09-10 DIAGNOSIS — E8770 Fluid overload, unspecified: Secondary | ICD-10-CM | POA: Diagnosis not present

## 2020-09-10 DIAGNOSIS — I129 Hypertensive chronic kidney disease with stage 1 through stage 4 chronic kidney disease, or unspecified chronic kidney disease: Secondary | ICD-10-CM | POA: Diagnosis not present

## 2020-09-10 DIAGNOSIS — N2581 Secondary hyperparathyroidism of renal origin: Secondary | ICD-10-CM | POA: Diagnosis not present

## 2020-09-20 NOTE — Progress Notes (Signed)
Cardiology Office Note:    Date:  09/22/2020   ID:  Carolyn Russo, DOB 1951-04-12, MRN [id number]  PCP:  Carolyn Chard, MD  Cardiologist:  Carolyn Grooms, MD   Referring MD: Carolyn Chard, MD   Chief Complaint  Patient presents with  . Coronary Artery Disease  . Hyperlipidemia  . Hypertension  . Advice Only    ckd    History of Present Illness:    Carolyn Russo is a 69 y.o. female with a hx of diabetes mellitus 2, primary hypertension, stage IV/V CKD (Carolyn Russo), hyperlipidemia, and nonobstructive coronary disease (cardiac catheterization in 2018).   She is accompanied by Carolyn Russo.  She developed dyspnea walking in from the parking lot.  She had to sit and rest immediately upon arriving in the exam room.  She experienced no chest discomfort.  According to the patient and her husband physical activity has significantly decreased since she developed pain in her left hip that they believe is related to bursitis.  She gets short of breath walking up a flight of stairs.  She sees Carolyn Russo who states that dialysis is likely to be required.  Kidney function is worsening.  Hemoglobin A1c remains elevated.  She is anemic and is now receiving erythropoietin injections to raise her hemoglobin.  She was consider dialysis and would be willing to go with dialysis and also consider pursuing kidney transplantation.  She denies orthopnea.  She does get lower extremity swelling.  She has not had PND.  Overall she feels stable from cardiac standpoint.  Past Medical History:  Diagnosis Date  . Allergy   . Anterior chest wall pain   . Appendicitis 1965  . Asthma   . Body mass index 37.0-37.9, adult   . Breast pain   . Cataract    both eyes  . Chronic renal disease, stage II   . Dehydration 2014  . Deviated septum 1971  . Diabetes mellitus   . Dyspnea 2014  . Extrinsic asthma    WITH ASTHMA ATTACK  . Fibroid 1980  . GERD (gastroesophageal reflux disease)   .  Heart murmur   . Hx gestational diabetes   . Hyperlipidemia   . Hypertension 2014  . Inguinal hernia 1959  . Malaise and fatigue 2014  . Nephropathy   . Non-IgE mediated allergic asthma 2014  . Obesity   . Pelvic pain   . Pregnancy, high-risk 1985  . Tonsillitis 1968  . Uterine fibroid 1980    Past Surgical History:  Procedure Laterality Date  . APPENDECTOMY  1959  . CESAREAN SECTION  1985  . COLONOSCOPY    . EYE SURGERY     bilateral cataract   . HERNIA REPAIR  1959  . LEFT HEART CATH AND CORONARY ANGIOGRAPHY N/A 04/04/2017   Procedure: Left Heart Cath and Coronary Angiography;  Surgeon: Belva Crome, MD;  Location: [facility];  Service: Cardiovascular;  Laterality: N/A;  . [place], 2004, 2007  . RHINOPLASTY  1971  . ROTATOR CUFF REPAIR  2003  . SURGICAL REPAIR OF HEMORRHAGE  2015  . TONSILLECTOMY  1968    Current Medications: Current Meds  Medication Sig  . albuterol (PROVENTIL HFA;VENTOLIN HFA) 108 (90 BASE) MCG/ACT inhaler Inhale 2 puffs into the lungs every 4 (four) hours as needed for wheezing or shortness of breath.  Marland Kitchen aspirin 81 MG tablet Take 81 mg by mouth daily.  Marland Kitchen b complex vitamins capsule Take  1 capsule by mouth daily.  Marland Kitchen BESIVANCE 0.6 % SUSP INSTILL ONE GTT IN RIGHT EYE QID FOR 2 DAYS AFTER EACH MONTHLY EYE INJECTION  . calcitRIOL (ROCALTROL) 0.25 MCG capsule Take 0.25 mcg by mouth 3 (three) times a week.  . Cholecalciferol (VITAMIN D3 PO) Take 2,000 Int'l Units by mouth daily.  . Continuous Blood Gluc Receiver (DEXCOM G6 RECEIVER) DEVI Use as directed to check blood sugars 3 times per day dx: e11.65  . Continuous Blood Gluc Sensor (DEXCOM G6 SENSOR) MISC Use as directed to check blood sugars 3 times per day dx: e11.65  . Continuous Blood Gluc Transmit (DEXCOM G6 TRANSMITTER) MISC Use as directed to check blood sugars 3 times per day dx: e11.65  . CVS CINNAMON PO Take 250 mg by mouth 2 (two) times daily.   . diclofenac Sodium (VOLTAREN) 1  % GEL Apply topically 4 (four) times daily.  . fluticasone (FLONASE) 50 MCG/ACT nasal spray Place 2 sprays into the nose daily as needed for allergies.   . furosemide (LASIX) 40 MG tablet Take 1/2 to 1 tablet by mouth daily  . glucose blood (ACCU-CHEK GUIDE) test strip Use as instructed to check blood sugars 2 times per day dx: e11.65  . insulin aspart protamine - aspart (NOVOLOG MIX 70/30 FLEXPEN) (70-30) 100 UNIT/ML FlexPen Inject 0.3 mLs (30 Units total) into the skin 2 (two) times daily with a meal.  . lactobacillus acidophilus (BACID) TABS Take 1 tablet by mouth daily. PROBIOTIC  . magnesium oxide (MAG-OX) 400 (241.3 Mg) MG tablet Take 1 tablet by mouth daily.  . milk thistle 175 MG tablet Take 525 mg by mouth daily. Takes 3 daily  . ondansetron (ZOFRAN) 4 MG tablet Take 4 mg by mouth at bedtime.  Glory Rosebush Delica Lancets 78E MISC Use as directed to check blood sugars before breakfast and dinner dx: e11.65  . polyethylene glycol (MIRALAX / GLYCOLAX) 17 g packet Take 17 g by mouth daily.  . Polyvinyl Alcohol-Povidone (REFRESH OP) Apply to eye.  . rosuvastatin (CRESTOR) 5 MG tablet TAKE 1 TABLET(5 MG) BY MOUTH DAILY  . SYMBICORT 160-4.5 MCG/ACT inhaler Inhale 2 puffs into the lungs 2 (two) times daily.  . valACYclovir (VALTREX) 1000 MG tablet valacyclovir 1 gram tablet  . valsartan-hydrochlorothiazide (DIOVAN-HCT) 320-12.5 MG tablet Take 1 tablet by mouth daily.     Allergies:   Food, Other, Wheat bran, Statins, Iodine, Shellfish allergy, Tetracycline, Tetracyclines & related, and Contrast media [iodinated diagnostic agents]   Social History   Socioeconomic History  . Marital status: Married    Spouse name: Not on file  . Number of children: 1  . Years of education: Not on file  . Highest education level: Professional school degree (e.g., MD, DDS, DVM, JD)  Occupational History  . Occupation: retired  Tobacco Use  . Smoking status: Never Smoker  . Smokeless tobacco: Never Used   Vaping Use  . Vaping Use: Never used  Substance and Sexual Activity  . Alcohol use: No  . Drug use: No  . Sexual activity: Yes    Birth control/protection: Post-menopausal  Other Topics Concern  . Not on file  Social History Narrative   Patient reports she used to walk at [employer] but now due to Covid-19 she is staying inside.   Social Determinants of Health   Financial Resource Strain: Low Risk   . Difficulty of Paying Living Expenses: Not hard at all  Food Insecurity: No Food Insecurity  . Worried About Estate manager/land agent  of Food in the Last Year: Never true  . Ran Out of Food in the Last Year: Never true  Transportation Needs: No Transportation Needs  . Lack of Transportation (Medical): No  . Lack of Transportation (Non-Medical): No  Physical Activity: Sufficiently Active  . Days of Exercise per Week: 4 days  . Minutes of Exercise per Session: 60 min  Stress: Stress Concern Present  . Feeling of Stress : To some extent  Social Connections:   . Frequency of Communication with Friends and Family: Not on file  . Frequency of Social Gatherings with Friends and Family: Not on file  . Attends Religious Services: Not on file  . Active Member of Clubs or Organizations: Not on file  . Attends Archivist Meetings: Not on file  . Marital Status: Not on file     Family History: The patient's family history includes Alzheimer's disease in her father; Cancer in her cousin and mother; Diabetes in her cousin and maternal aunt; Psoriasis in her mother. There is no history of Colon cancer, Colon polyps, Esophageal cancer, Rectal cancer, Stomach cancer, or Breast cancer.  ROS:   Please see the history of present illness.    Carolyn Russo states that she sleeps a lot.  She feels tired frequently.  She will be seeing a neurologist and considering a sleep study.  All other systems reviewed and are negative.  EKGs/Labs/Other Studies Reviewed:    The following studies were reviewed  today:  CARDIAC CATH 04/04/2020: Conclusion   Heavily calcified eccentric 30 to 40% proximal LAD.  Widely patent circumflex, ramus intermedius, and native right coronary.  Normal left ventricular size and function with EF 60%. Elevated left ventricular end-diastolic pressure consistent with diastolic heart failure.  RECOMMENDATIONS:   Aggressive risk factor modification  Blood pressure control  Weight loss  Consider sleep study to rule out sleep apnea (based upon airway obstruction noted during the procedure after receiving low-dose sedation).   EKG:  EKG normal sinus rhythm, low voltage, normal PR interval, overall essentially normal EKG with the exception of small inferior Q waves.  Wave progression.  When compared to September 12, 2019, R wave forces anteriorly are somewhat improved.  Recent Labs: 05/19/2020: BNP 104.4 06/19/2020: BUN 38; Creatinine 3.1; Potassium 4.3; Sodium 137 08/12/2020: ALT 20; Hemoglobin 9.8; Platelets 303  Recent Lipid Panel    Component Value Date/Time   CHOL 158 08/12/2020 1749   TRIG 117 08/12/2020 1749   HDL 38 (L) 08/12/2020 1749   CHOLHDL 4.2 08/12/2020 1749   CHOLHDL 4.8 10/13/2011 0903   VLDL 28 10/13/2011 0903   LDLCALC 99 08/12/2020 1749    Physical Exam:    VS:  BP (!) 148/70   Pulse 81   Ht 5' 0.04" (1.525 m)   Wt 210 lb (95.3 kg)   SpO2 98%   BMI 40.96 kg/m     Wt Readings from Last 3 Encounters:  09/22/20 210 lb (95.3 kg)  08/12/20 207 lb (93.9 kg)  08/12/20 207 lb (93.9 kg)     GEN: Morbid obesity. No acute distress HEENT: Normal NECK: No JVD. LYMPHATICS: No lymphadenopathy CARDIAC:  RRR without murmur, gallop, or edema. VASCULAR:  Normal Pulses. No bruits. RESPIRATORY:  Clear to auscultation without rales, wheezing or rhonchi  ABDOMEN: Soft, non-tender, non-distended, No pulsatile mass, MUSCULOSKELETAL: No deformity  SKIN: Warm and dry NEUROLOGIC:  Alert and oriented x 3 PSYCHIATRIC:  Normal affect    ASSESSMENT:    1. Coronary artery disease  involving native coronary artery of native heart without angina pectoris   2. Dyslipidemia (high LDL; low HDL)   3. Uncontrolled type 2 diabetes mellitus with hyperglycemia ([place])   4. CKD (chronic kidney disease) stage 4, GFR 15-29 ml/min (HCC)   5. Hypertensive kidney disease with stage 3 chronic kidney disease, unspecified whether stage 3a or 3b CKD ([place])   6. Diminished pulses in lower extremity   7. Educated about COVID-19 virus infection    PLAN:    In order of problems listed above:  1. Primary prevention of acute cardiac events is reviewed.  We discussed achieving greater than 150 minutes of moderate activity, LDL less than 70, hemoglobin A1c less than 7, blood pressure less than 130/80 mmHg.  Will defer to Dr. Johnney Ou before making changes in antihypertensive regimen. 2. We should double rosuvastatin dose to 10 mg/day.  Lipid panel should be done in 6 to 8 weeks. 3. Discussed decreasing caloric intake, increasing physical activity, to decrease hemoglobin A1c. 4. CKD stage IV/V.  Followed closely by Dr. Johnney Ou.  Dialysis has been discussed.  Transplantation also interests the patient. 5. Blood pressure control is important and decreasing progression of kidney dysfunction.  Continue Diovan HCT, furosemide, and consider adding calcium channel blocker or possibly beta-blocker therapy.  Hydralazine could also be considered or more aggressive diuresis with loop. 6. Not addressed 7. COVID-19 vaccinated and practicing mitigation.  Overall education and awareness concerning secondary risk prevention was discussed in detail: LDL less than 70, hemoglobin A1c less than 7, blood pressure target less than 130/80 mmHg, >150 minutes of moderate aerobic activity per week, avoidance of smoking, weight control (via diet and exercise), and continued surveillance/management of/for obstructive sleep apnea.    Medication Adjustments/Labs and Tests  Ordered: Current medicines are reviewed at length with the patient today.  Concerns regarding medicines are outlined above.  Orders Placed This Encounter  Procedures  . EKG 12-Lead   No orders of the defined types were placed in this encounter.   Patient Instructions  Medication Instructions:  Your physician recommends that you continue on your current medications as directed. Please refer to the Current Medication list given to you today.  *If you need a refill on your cardiac medications before your next appointment, please call your pharmacy*   Lab Work: None If you have labs (blood work) drawn today and your tests are completely normal, you will receive your results only by: Marland Kitchen MyChart Message (if you have MyChart) OR . A paper copy in the mail If you have any lab test that is abnormal or we need to change your treatment, we will call you to review the results.   Testing/Procedures: None   Follow-Up: At [facility], you and your health needs are our priority.  As part of our continuing mission to provide you with exceptional heart care, we have created designated Provider Care Teams.  These Care Teams include your primary Cardiologist (physician) and Advanced Practice Providers (APPs -  Physician Assistants and Nurse Practitioners) who all work together to provide you with the care you need, when you need it.  We recommend signing up for the patient portal called "MyChart".  Sign up information is provided on this After Visit Summary.  MyChart is used to connect with patients for Virtual Visits (Telemedicine).  Patients are able to view lab/test results, encounter notes, upcoming appointments, etc.  Non-urgent messages can be sent to your provider as well.   To learn more about what you can  do with MyChart, go to [email or web address].    Your next appointment:   1 year(s)  The format for your next appointment:   In Person  Provider:   You may see Carolyn Grooms,  MD or one of the following Advanced Practice Providers on your designated Care Team:    Truitt Merle, NP  Cecilie Kicks, NP  Kathyrn Drown, NP    Other Instructions      Signed, Carolyn Grooms, MD  09/22/2020 5:30 PM    [place]

## 2020-09-22 ENCOUNTER — Encounter: Payer: Self-pay | Admitting: Interventional Cardiology

## 2020-09-22 ENCOUNTER — Other Ambulatory Visit: Payer: Self-pay

## 2020-09-22 ENCOUNTER — Ambulatory Visit (INDEPENDENT_AMBULATORY_CARE_PROVIDER_SITE_OTHER): Payer: Medicare Other | Admitting: Interventional Cardiology

## 2020-09-22 VITALS — BP 148/70 | HR 81 | Ht 60.04 in | Wt 210.0 lb

## 2020-09-22 DIAGNOSIS — N184 Chronic kidney disease, stage 4 (severe): Secondary | ICD-10-CM | POA: Diagnosis not present

## 2020-09-22 DIAGNOSIS — Z7189 Other specified counseling: Secondary | ICD-10-CM | POA: Diagnosis not present

## 2020-09-22 DIAGNOSIS — I2510 Atherosclerotic heart disease of native coronary artery without angina pectoris: Secondary | ICD-10-CM

## 2020-09-22 DIAGNOSIS — N183 Chronic kidney disease, stage 3 unspecified: Secondary | ICD-10-CM

## 2020-09-22 DIAGNOSIS — I129 Hypertensive chronic kidney disease with stage 1 through stage 4 chronic kidney disease, or unspecified chronic kidney disease: Secondary | ICD-10-CM

## 2020-09-22 DIAGNOSIS — R0989 Other specified symptoms and signs involving the circulatory and respiratory systems: Secondary | ICD-10-CM | POA: Diagnosis not present

## 2020-09-22 DIAGNOSIS — E1165 Type 2 diabetes mellitus with hyperglycemia: Secondary | ICD-10-CM

## 2020-09-22 DIAGNOSIS — E785 Hyperlipidemia, unspecified: Secondary | ICD-10-CM

## 2020-09-22 NOTE — Patient Instructions (Signed)
Medication Instructions:  Your physician recommends that you continue on your current medications as directed. Please refer to the Current Medication list given to you today.  *If you need a refill on your cardiac medications before your next appointment, please call your pharmacy*   Lab Work: None If you have labs (blood work) drawn today and your tests are completely normal, you will receive your results only by: . MyChart Message (if you have MyChart) OR . A paper copy in the mail If you have any lab test that is abnormal or we need to change your treatment, we will call you to review the results.   Testing/Procedures: None   Follow-Up: At [facility], you and your health needs are our priority.  As part of our continuing mission to provide you with exceptional heart care, we have created designated Provider Care Teams.  These Care Teams include your primary Cardiologist (physician) and Advanced Practice Providers (APPs -  Physician Assistants and Nurse Practitioners) who all work together to provide you with the care you need, when you need it.  We recommend signing up for the patient portal called "MyChart".  Sign up information is provided on this After Visit Summary.  MyChart is used to connect with patients for Virtual Visits (Telemedicine).  Patients are able to view lab/test results, encounter notes, upcoming appointments, etc.  Non-urgent messages can be sent to your provider as well.   To learn more about what you can do with MyChart, go to [email or web address].    Your next appointment:   1 year(s)  The format for your next appointment:   In Person  Provider:   You may see Henry W Smith III, MD or one of the following Advanced Practice Providers on your designated Care Team:    Lori Gerhardt, NP  Laura Ingold, NP  Jill McDaniel, NP    Other Instructions   

## 2020-09-23 ENCOUNTER — Ambulatory Visit (INDEPENDENT_AMBULATORY_CARE_PROVIDER_SITE_OTHER): Payer: Medicare Other | Admitting: Neurology

## 2020-09-23 ENCOUNTER — Encounter: Payer: Self-pay | Admitting: Neurology

## 2020-09-23 VITALS — BP 164/86 | HR 82

## 2020-09-23 DIAGNOSIS — I2510 Atherosclerotic heart disease of native coronary artery without angina pectoris: Secondary | ICD-10-CM

## 2020-09-23 DIAGNOSIS — G4719 Other hypersomnia: Secondary | ICD-10-CM | POA: Diagnosis not present

## 2020-09-23 DIAGNOSIS — R0683 Snoring: Secondary | ICD-10-CM

## 2020-09-23 DIAGNOSIS — R351 Nocturia: Secondary | ICD-10-CM | POA: Diagnosis not present

## 2020-09-23 NOTE — Patient Instructions (Signed)
Thank you for choosing [facility] for your sleep related care! It was nice to meet you today! I appreciate that you entrust me with your sleep related healthcare concerns. I hope, I was able to address at least some of your concerns today, and that I can help you feel reassured and also get better.    Here is what we discussed today and what we came up with as our plan for you:    Based on your symptoms and your exam I believe you are at risk for obstructive sleep apnea (aka OSA), and I think we should proceed with a sleep study to determine whether you do or do not have OSA and how severe it is. Even, if you have mild OSA, I may want you to consider treatment with CPAP, as treatment of even borderline or mild sleep apnea can result and improvement of symptoms such as sleep disruption, daytime sleepiness, nighttime bathroom breaks, restless leg symptoms, improvement of headache syndromes, even improved mood disorder.   As explained, an attended sleep study meaning you get to stay overnight in the sleep lab, lets us monitor sleep-related behaviors such as sleep talking and leg movements in sleep, in addition to monitoring for sleep apnea.  A home sleep test is a screening tool for sleep apnea only, and unfortunately does not help with any other sleep-related diagnoses.  Please remember, the long-term risks and ramifications of untreated moderate to severe obstructive sleep apnea are: increased Cardiovascular disease, including congestive heart failure, stroke, difficult to control hypertension, treatment resistant obesity, arrhythmias, especially irregular heartbeat commonly known as A. Fib. (atrial fibrillation); even type 2 diabetes has been linked to untreated OSA.   Sleep apnea can cause disruption of sleep and sleep deprivation in most cases, which, in turn, can cause recurrent headaches, problems with memory, mood, concentration, focus, and vigilance. Most people with untreated  sleep apnea report excessive daytime sleepiness, which can affect their ability to drive. Please do not drive if you feel sleepy. Patients with sleep apnea can also develop difficulty initiating and maintaining sleep (aka insomnia).   Having sleep apnea may increase your risk for other sleep disorders, including involuntary behaviors sleep such as sleep terrors, sleep talking, sleepwalking.    Having sleep apnea can also increase your risk for restless leg syndrome and leg movements at night.   Please note that untreated obstructive sleep apnea may carry additional perioperative morbidity. Patients with significant obstructive sleep apnea (typically, in the moderate to severe degree) should receive, if possible, perioperative PAP (positive airway pressure) therapy and the surgeons and particularly the anesthesiologists should be informed of the diagnosis and the severity of the sleep disordered breathing.   I will likely see you back after your sleep study to go over the test results and where to go from there. We will call you after your sleep study to advise about the results (most likely, you will hear from Megan, my nurse) and to set up an appointment at the time, as necessary.    Our sleep lab administrative assistant will call you to schedule your sleep study and give you further instructions, regarding the check in process for the sleep study, arrival time, what to bring, when you can expect to leave after the study, etc., and to answer any other logistical questions you may have. If you don't hear back from her by about 2 weeks from now, please feel free to call her direct line at [phone] or you can call   our general clinic number, or email us through My Chart.   

## 2020-09-23 NOTE — Progress Notes (Signed)
Subjective:    Patient ID: Carolyn Russo is a 69 y.o. female.  HPI     Star Age, MD, PhD [facility] [address], [address] [address] [place], [name] [address]  Dear Dr.  Baird Cancer,  I saw your patient, Carolyn Russo, upon your kind request in my sleep clinic today for initial consultation of her sleep disorder, in particular, concern for underlying obstructive sleep apnea.  The patient is accompanied by her husband today.  As you know, [name] is a 69 year old right-handed woman with an underlying medical history of hypertension, hyperlipidemia, diabetes, asthma, reflux disease, allergies, R hip bursitis, and severe obesity with a BMI of over 40, who reports some snoring and excessive daytime somnolence.  I reviewed your office note from 08/12/2020.  Her Epworth sleepiness score is 15/24, fatigue severity score is 45 out of 63.  She has been sleepy during the day for the past 1 year.  She does not have a lifelong history of severe sleepiness.  Her husband is wondering if she has narcolepsy.  She has fallen asleep at a drive-through window.  She has not fallen asleep while driving or at a stoplight.  She does take a nap when she has time.  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  She lives with her husband, they have no pets in the house.  They have 1 grown child.  She is a non-smoker and does not utilize alcohol and does not drink caffeine on a daily basis.  She is not aware of any family history of sleep apnea.  She has significant nocturia about 3-4 times per average night.  She denies recurrent morning headaches.  She is working on weight loss and has an appointment pending to see a nutritionist. Her bedtime is around 11 PM, rise time around 7:30 AM.  She had a tonsillectomy at age 39.  She does not have a TV in the bedroom.  Her Past Medical History Is Significant For: Past Medical History:  Diagnosis Date  . Allergy   . Anterior chest wall pain   .  Appendicitis 1965  . Asthma   . Body mass index 37.0-37.9, adult   . Breast pain   . Cataract    both eyes  . Chronic renal disease, stage II   . Dehydration 2014  . Deviated septum 1971  . Diabetes mellitus   . Dyspnea 2014  . Extrinsic asthma    WITH ASTHMA ATTACK  . Fibroid 1980  . GERD (gastroesophageal reflux disease)   . Heart murmur   . Hx gestational diabetes   . Hyperlipidemia   . Hypertension 2014  . Inguinal hernia 1959  . Malaise and fatigue 2014  . Nephropathy   . Non-IgE mediated allergic asthma 2014  . Obesity   . Pelvic pain   . Pregnancy, high-risk 1985  . Tonsillitis 1968  . Uterine fibroid 1980    Her Past Surgical History Is Significant For: Past Surgical History:  Procedure Laterality Date  . APPENDECTOMY  1959  . CESAREAN SECTION  1985  . COLONOSCOPY    . EYE SURGERY     bilateral cataract   . HERNIA REPAIR  1959  . LEFT HEART CATH AND CORONARY ANGIOGRAPHY N/A 04/04/2017   Procedure: Left Heart Cath and Coronary Angiography;  Surgeon: Belva Crome, MD;  Location: [facility];  Service: Cardiovascular;  Laterality: N/A;  . [place], 2004, 2007  . RHINOPLASTY  1971  .  ROTATOR CUFF REPAIR  2003  . SURGICAL REPAIR OF HEMORRHAGE  2015  . TONSILLECTOMY  1968    Her Family History Is Significant For: Family History  Problem Relation Age of Onset  . Cancer Mother        abdominal melamona  . Psoriasis Mother   . Alzheimer's disease Father   . Cancer Cousin        colon   . Diabetes Cousin   . Diabetes Maternal Aunt   . Colon cancer Neg Hx   . Colon polyps Neg Hx   . Esophageal cancer Neg Hx   . Rectal cancer Neg Hx   . Stomach cancer Neg Hx   . Breast cancer Neg Hx     Her Social History Is Significant For: Social History   Socioeconomic History  . Marital status: Married    Spouse name: Not on file  . Number of children: 1  . Years of education: Not on file  . Highest education level: Professional school degree  (e.g., MD, DDS, DVM, JD)  Occupational History  . Occupation: retired  Tobacco Use  . Smoking status: Never Smoker  . Smokeless tobacco: Never Used  Vaping Use  . Vaping Use: Never used  Substance and Sexual Activity  . Alcohol use: No  . Drug use: No  . Sexual activity: Yes    Birth control/protection: Post-menopausal  Other Topics Concern  . Not on file  Social History Narrative   Patient reports she used to walk at [employer] but now due to Covid-19 she is staying inside.   Social Determinants of Health   Financial Resource Strain: Low Risk   . Difficulty of Paying Living Expenses: Not hard at all  Food Insecurity: No Food Insecurity  . Worried About Charity fundraiser in the Last Year: Never true  . Ran Out of Food in the Last Year: Never true  Transportation Needs: No Transportation Needs  . Lack of Transportation (Medical): No  . Lack of Transportation (Non-Medical): No  Physical Activity: Sufficiently Active  . Days of Exercise per Week: 4 days  . Minutes of Exercise per Session: 60 min  Stress: Stress Concern Present  . Feeling of Stress : To some extent  Social Connections:   . Frequency of Communication with Friends and Family: Not on file  . Frequency of Social Gatherings with Friends and Family: Not on file  . Attends Religious Services: Not on file  . Active Member of Clubs or Organizations: Not on file  . Attends Archivist Meetings: Not on file  . Marital Status: Not on file    Her Allergies Are:  Allergies  Allergen Reactions  . Food Anaphylaxis    Peanuts; Almonds  . Other Shortness Of Breath    Peanuts; Almonds  . Wheat Bran Shortness Of Breath  . Statins Itching and Other (See Comments)    Generalized aches  . Iodine Other (See Comments)  . Shellfish Allergy Other (See Comments)    Mouth gets raw  . Tetracycline Other (See Comments)    Raw mouth  . Tetracyclines & Related Itching  . Contrast Media [Iodinated Diagnostic Agents] Rash     Happened during CT scan over 30 years ago  :   Her Current Medications Are:  Outpatient Encounter Medications as of 09/23/2020  Medication Sig  . albuterol (PROVENTIL HFA;VENTOLIN HFA) 108 (90 BASE) MCG/ACT inhaler Inhale 2 puffs into the lungs every 4 (four) hours as needed for wheezing or  shortness of breath.  Marland Kitchen aspirin 81 MG tablet Take 81 mg by mouth daily.  Marland Kitchen b complex vitamins capsule Take 1 capsule by mouth daily.  Marland Kitchen BESIVANCE 0.6 % SUSP INSTILL ONE GTT IN RIGHT EYE QID FOR 2 DAYS AFTER EACH MONTHLY EYE INJECTION  . calcitRIOL (ROCALTROL) 0.25 MCG capsule Take 0.25 mcg by mouth 3 (three) times a week.  . Cholecalciferol (VITAMIN D3 PO) Take 2,000 Int'l Units by mouth daily.  . Continuous Blood Gluc Receiver (DEXCOM G6 RECEIVER) DEVI Use as directed to check blood sugars 3 times per day dx: e11.65  . Continuous Blood Gluc Sensor (DEXCOM G6 SENSOR) MISC Use as directed to check blood sugars 3 times per day dx: e11.65  . Continuous Blood Gluc Transmit (DEXCOM G6 TRANSMITTER) MISC Use as directed to check blood sugars 3 times per day dx: e11.65  . CVS CINNAMON PO Take 250 mg by mouth 2 (two) times daily.   . diclofenac Sodium (VOLTAREN) 1 % GEL Apply topically 4 (four) times daily.  . fluticasone (FLONASE) 50 MCG/ACT nasal spray Place 2 sprays into the nose daily as needed for allergies.   . furosemide (LASIX) 40 MG tablet Take 1/2 to 1 tablet by mouth daily  . glucose blood (ACCU-CHEK GUIDE) test strip Use as instructed to check blood sugars 2 times per day dx: e11.65  . insulin aspart protamine - aspart (NOVOLOG MIX 70/30 FLEXPEN) (70-30) 100 UNIT/ML FlexPen Inject 0.3 mLs (30 Units total) into the skin 2 (two) times daily with a meal.  . lactobacillus acidophilus (BACID) TABS Take 1 tablet by mouth daily. PROBIOTIC  . magnesium oxide (MAG-OX) 400 (241.3 Mg) MG tablet Take 1 tablet by mouth daily.  . milk thistle 175 MG tablet Take 525 mg by mouth daily. Takes 3 daily  . ondansetron  (ZOFRAN) 4 MG tablet Take 4 mg by mouth at bedtime.  Glory Rosebush Delica Lancets 82X MISC Use as directed to check blood sugars before breakfast and dinner dx: e11.65  . polyethylene glycol (MIRALAX / GLYCOLAX) 17 g packet Take 17 g by mouth daily.  . Polyvinyl Alcohol-Povidone (REFRESH OP) Apply to eye.  . rosuvastatin (CRESTOR) 5 MG tablet TAKE 1 TABLET(5 MG) BY MOUTH DAILY  . SYMBICORT 160-4.5 MCG/ACT inhaler Inhale 2 puffs into the lungs 2 (two) times daily.  . valACYclovir (VALTREX) 1000 MG tablet valacyclovir 1 gram tablet  . valsartan-hydrochlorothiazide (DIOVAN-HCT) 320-12.5 MG tablet Take 1 tablet by mouth daily.  . [DISCONTINUED] valsartan (DIOVAN) 80 MG tablet Take 1 tablet (80 mg total) by mouth daily.   No facility-administered encounter medications on file as of 09/23/2020.  :  Review of Systems:  Out of a complete 14 point review of systems, all are reviewed and negative with the exception of these symptoms as listed below: Review of Systems  Neurological:       Pt presents today to discuss her sleep. Pt has never had a sleep study but does endorse snoring.  Epworth Sleepiness Scale 0= would never doze 1= slight chance of dozing 2= moderate chance of dozing 3= high chance of dozing  Sitting and reading: 1 Watching TV: 3 Sitting inactive in a public place (ex. Theater or meeting): 2 As a passenger in a car for an hour without a break: 3 Lying down to rest in the afternoon: 3 Sitting and talking to someone: 1 Sitting quietly after lunch (no alcohol): 1 In a car, while stopped in traffic: 1  Total: 15  Objective:  Neurological Exam  Physical Exam Physical Examination:   Vitals:   09/23/20 1511  BP: (!) 164/86  Pulse: 82    General Examination: The patient is a very pleasant 69 y.o. female in no acute distress. She appears well-developed and well-nourished and well groomed.   HEENT: Normocephalic, atraumatic, pupils are equal, round and reactive to light,  extraocular tracking is good without limitation to gaze excursion or nystagmus noted. Hearing is impaired. Face is symmetric with normal facial animation. Speech is clear with no dysarthria noted. There is no hypophonia. There is no lip, neck/head, jaw or voice tremor. Neck is supple with full range of passive and active motion. There are no carotid bruits on auscultation. Oropharynx exam reveals: mild mouth dryness, adequate dental hygiene and mild airway crowding, due to redundant soft palate, tonsils absent, Mallampati class II.  Neck circumference of 14 and three-quarter inches.  Tongue protrudes centrally and palate elevates symmetrically.  Chest: Clear to auscultation without wheezing, rhonchi or crackles noted.  Heart: S1+S2+0, regular and normal without murmurs, rubs or gallops noted.   Abdomen: Soft, non-tender and non-distended with normal bowel sounds appreciated on auscultation.  Extremities: There is 1+ pitting edema in the distal lower extremities bilaterally.   Skin: Warm and dry without trophic changes noted.   Musculoskeletal: exam reveals right hip discomfort.    Neurologically:  Mental status: The patient is awake, alert and oriented in all 4 spheres. Her immediate and remote memory, attention, language skills and fund of knowledge are appropriate. There is no evidence of aphasia, agnosia, apraxia or anomia. Speech is clear with normal prosody and enunciation. Thought process is linear. Mood is normal and affect is normal.  Cranial nerves II - XII are as described above under HEENT exam.  Motor exam: Normal bulk, strength and tone is noted. There is no tremor. Fine motor skills and coordination: grossly intact.  Cerebellar testing: No dysmetria or intention tremor. There is no truncal or gait ataxia.  Sensory exam: intact to light touch in the upper and lower extremities.  Gait, station and balance: She stands with difficulty. No veering to one side is noted. No leaning to one  side is noted. Posture is age-appropriate and stance is somewhat wider base.  She walks slowly with a single-point cane on the left side.  She has a limp on the right.  She did request a wheelchair to get into the exam room from the lobby.   Assessment and Plan:  In summary, Gay F Prajapati is a very pleasant 69 y.o.-year old female with an underlying medical history of hypertension, hyperlipidemia, diabetes, asthma, reflux disease, allergies, R hip bursitis, and severe obesity with a BMI of over 40, whose history and physical exam are concerning for obstructive sleep apnea (OSA).  The fact that she has had daytime somnolence for the past year would speak against narcolepsy.  If we can rule out obstructive sleep apnea, we can certainly embark on more extended sleep testing to evaluate for an underlying hypersomnolence disorder.  The patient is discouraged from driving when feeling sleepy. I had a long chat with the patient and her husband about my findings and the diagnosis of OSA, its prognosis and treatment options. We talked about medical treatments, surgical interventions and non-pharmacological approaches. I explained in particular the risks and ramifications of untreated moderate to severe OSA, especially with respect to developing cardiovascular disease down the Road, including congestive heart failure, difficult to treat hypertension, cardiac arrhythmias, or stroke. Even  type 2 diabetes has, in part, been linked to untreated OSA. Symptoms of untreated OSA include daytime sleepiness, memory problems, mood irritability and mood disorder such as depression and anxiety, lack of energy, as well as recurrent headaches, especially morning headaches. We talked about trying to maintain a healthy lifestyle in general, as well as the importance of weight control. We also talked about the importance of good sleep hygiene. I recommended the following at this time: sleep study.   I explained the sleep test procedure  to the patient and also outlined possible surgical and non-surgical treatment options of OSA, including the use of a custom-made dental device (which would require a referral to a specialist dentist or oral surgeon), upper airway surgical options, such as traditional UPPP or a novel less invasive surgical option in the form of Inspire hypoglossal nerve stimulation (which would involve a referral to an ENT surgeon). I also explained the CPAP treatment option to the patient, who indicated that she would be willing to try CPAP if the need arises. I explained the importance of being compliant with PAP treatment, not only for insurance purposes but primarily to improve Her symptoms, and for the patient's long term health benefit, including to reduce Her cardiovascular risks. I answered all their questions today and the patient and her husband were in agreement. I plan to see her back after the sleep study is completed and encouraged them to call with any interim questions, concerns, problems or updates.   Thank you very much for allowing me to participate in the care of this nice patient. If I can be of any further assistance to you please do not hesitate to call me at [phone].  Sincerely,   Star Age, MD, PhD

## 2020-09-24 ENCOUNTER — Telehealth: Payer: Self-pay | Admitting: *Deleted

## 2020-09-24 ENCOUNTER — Encounter: Payer: Self-pay | Admitting: Internal Medicine

## 2020-09-24 ENCOUNTER — Other Ambulatory Visit (HOSPITAL_COMMUNITY): Payer: Self-pay

## 2020-09-24 DIAGNOSIS — D631 Anemia in chronic kidney disease: Secondary | ICD-10-CM | POA: Insufficient documentation

## 2020-09-24 DIAGNOSIS — E785 Hyperlipidemia, unspecified: Secondary | ICD-10-CM

## 2020-09-24 DIAGNOSIS — N189 Chronic kidney disease, unspecified: Secondary | ICD-10-CM | POA: Insufficient documentation

## 2020-09-24 NOTE — Telephone Encounter (Signed)
Left message to call back  

## 2020-09-24 NOTE — Discharge Instructions (Signed)

## 2020-09-24 NOTE — Telephone Encounter (Signed)
-----   Message from Belva Crome, MD sent at 09/22/2020  5:37 PM EST ----- Regarding: Elevated cholesterol, LDL 98.5 mg/day. Increase rosuvastatin to 10 mg/day from 5 mg/day.  Lipid panel in 6 to 8 weeks.

## 2020-09-25 ENCOUNTER — Encounter (HOSPITAL_COMMUNITY)
Admission: RE | Admit: 2020-09-25 | Discharge: 2020-09-25 | Disposition: A | Discharge status: Home / Self Care | Payer: Medicare Other | Source: Ambulatory Visit | Attending: Internal Medicine | Admitting: Internal Medicine

## 2020-09-25 ENCOUNTER — Other Ambulatory Visit: Payer: Self-pay

## 2020-09-25 DIAGNOSIS — D631 Anemia in chronic kidney disease: Secondary | ICD-10-CM | POA: Diagnosis not present

## 2020-09-25 DIAGNOSIS — N181 Chronic kidney disease, stage 1: Secondary | ICD-10-CM | POA: Insufficient documentation

## 2020-09-25 LAB — POCT HEMOGLOBIN-HEMACUE: Hemoglobin: 8.9 g/dL — ABNORMAL LOW (ref 12.0–15.0)

## 2020-09-25 MED ORDER — ROSUVASTATIN CALCIUM 10 MG PO TABS: 10.0000 mg | ORAL_TABLET | Freq: Every day | ORAL | 3 refills | Status: DC

## 2020-09-25 MED ORDER — EPOETIN ALFA-EPBX 3000 UNIT/ML IJ SOLN
INTRAMUSCULAR | Status: AC
  Administered 2020-09-25: 3000 [IU] via SUBCUTANEOUS
  Filled 2020-09-25: qty 1

## 2020-09-25 MED ORDER — EPOETIN ALFA-EPBX 10000 UNIT/ML IJ SOLN: 15000.0000 [IU] | INTRAMUSCULAR | Status: DC

## 2020-09-25 MED ORDER — EPOETIN ALFA-EPBX 10000 UNIT/ML IJ SOLN
INTRAMUSCULAR | Status: AC
  Administered 2020-09-25: 10000 [IU] via SUBCUTANEOUS
  Filled 2020-09-25: qty 1

## 2020-09-25 MED ORDER — EPOETIN ALFA-EPBX 2000 UNIT/ML IJ SOLN
INTRAMUSCULAR | Status: AC
  Administered 2020-09-25: 2000 [IU] via SUBCUTANEOUS
  Filled 2020-09-25: qty 1

## 2020-09-25 NOTE — Telephone Encounter (Signed)
Spoke with pt and made her aware of recommendations.  Pt will come for labs on 11/18/20.  Pt verbalized understanding and was in agreement with this plan.

## 2020-09-25 NOTE — Telephone Encounter (Signed)
Patient returning call. She states it is okay to leave a message and requests a nurse call her pharmacy.

## 2020-09-28 ENCOUNTER — Other Ambulatory Visit: Payer: Self-pay

## 2020-09-28 ENCOUNTER — Encounter (INDEPENDENT_AMBULATORY_CARE_PROVIDER_SITE_OTHER): Payer: Medicare Other | Admitting: Ophthalmology

## 2020-09-28 DIAGNOSIS — E11311 Type 2 diabetes mellitus with unspecified diabetic retinopathy with macular edema: Secondary | ICD-10-CM

## 2020-09-28 DIAGNOSIS — I10 Essential (primary) hypertension: Secondary | ICD-10-CM

## 2020-09-28 DIAGNOSIS — H35371 Puckering of macula, right eye: Secondary | ICD-10-CM

## 2020-09-28 DIAGNOSIS — E113513 Type 2 diabetes mellitus with proliferative diabetic retinopathy with macular edema, bilateral: Secondary | ICD-10-CM | POA: Diagnosis not present

## 2020-09-28 DIAGNOSIS — H35033 Hypertensive retinopathy, bilateral: Secondary | ICD-10-CM | POA: Diagnosis not present

## 2020-09-28 DIAGNOSIS — H43813 Vitreous degeneration, bilateral: Secondary | ICD-10-CM

## 2020-10-01 ENCOUNTER — Telehealth: Payer: Self-pay

## 2020-10-01 NOTE — Telephone Encounter (Signed)
The pt wanted to know if she should postpone getting her covid booster this Friday because she had a extraction.  I called the pt to let her know if it was yesterday the pt can get it tomorrow or postpone getting the booster and that she would need to go to the pharmacy, this is the last Friday this year the office will be available to give the injection.

## 2020-10-02 ENCOUNTER — Ambulatory Visit: Payer: Medicare Other

## 2020-10-07 ENCOUNTER — Other Ambulatory Visit: Payer: Self-pay

## 2020-10-07 ENCOUNTER — Ambulatory Visit (INDEPENDENT_AMBULATORY_CARE_PROVIDER_SITE_OTHER): Payer: Medicare Other | Admitting: Neurology

## 2020-10-07 DIAGNOSIS — G4719 Other hypersomnia: Secondary | ICD-10-CM

## 2020-10-07 DIAGNOSIS — R0683 Snoring: Secondary | ICD-10-CM

## 2020-10-07 DIAGNOSIS — G4720 Circadian rhythm sleep disorder, unspecified type: Secondary | ICD-10-CM

## 2020-10-07 DIAGNOSIS — R351 Nocturia: Secondary | ICD-10-CM

## 2020-10-15 ENCOUNTER — Ambulatory Visit: Payer: Medicare Other | Admitting: Dietician

## 2020-10-20 ENCOUNTER — Telehealth: Payer: Self-pay

## 2020-10-20 NOTE — Procedures (Signed)
PATIENT'S NAME:  Carolyn Russo, Carolyn Russo DOB:      [birth year]      MR#:    [id number]     DATE OF RECORDING: 10/07/2020 REFERRING M.D.:  [name] MD Study Performed:   Baseline Polysomnogram HISTORY: 69 year old woman with a history of hypertension, hyperlipidemia, diabetes, asthma, reflux disease, allergies, R hip bursitis, and severe obesity with a BMI of over 40, who reports some snoring and excessive daytime somnolence.  The patient endorsed the Epworth Sleepiness Scale at 15 points. The patient's weight 210 pounds with a height of 60 (inches), resulting in a BMI of 41.1 kg/m2. The patient's neck circumference measured 14.8 inches.  CURRENT MEDICATIONS: Ventolin, ASA 81mg , B-Complex, Rocaltrol, Vitamin D3, Voltaren, Flonase, Lasix, Novolog, Zofran, Magnesium, Mirilax, Crestor, Symbicort, Valtrex, Diovan-HCT   PROCEDURE:  This is a multichannel digital polysomnogram utilizing the Somnostar 11.2 system.  Electrodes and sensors were applied and monitored per AASM Specifications.   EEG, EOG, Chin and Limb EMG, were sampled at 200 Hz.  ECG, Snore and Nasal Pressure, Thermal Airflow, Respiratory Effort, CPAP Flow and Pressure, Oximetry was sampled at 50 Hz. Digital video and audio were recorded.      BASELINE STUDY  Lights Out was at 22:56 and Lights On at 05:02.  Total recording time (TRT) was 366 minutes, with a total sleep time (TST) of 191.5 minutes.   The patient's sleep latency was 12.5 minutes.  REM latency was 57.5 minutes. The sleep efficiency was 52.3%, which is markedly reduced.     SLEEP ARCHITECTURE: WASO (Wake after sleep onset) was 154.5 minutes, which is highly increased, with several longer periods of wakefulness and moderate sleep fragmentation noted. There were 27.5 minutes in Stage N1, 114.5 minutes Stage N2, 24.5 minutes Stage N3 and 25 minutes in Stage REM.  The percentage of Stage N1 was 14.4%, which is increased, Stage N2 was 59.8%, which is mildly increased, Stage N3 was 12.8% and  Stage R (REM sleep) was 13.1%, which is reduced. The arousals were noted as: 35 were spontaneous, 0 were associated with PLMs, 0 were associated with respiratory events.  RESPIRATORY ANALYSIS:  There were a total of 5 respiratory events:  0 obstructive apneas, 0 central apneas and 0 mixed apneas with a total of 0 apneas and an apnea index (AI) of 0 /hour. There were 5 hypopneas with a hypopnea index of 1.6 /hour. The patient also had 0 respiratory event related arousals (RERAs).      The total APNEA/HYPOPNEA INDEX (AHI) was 1.6/hour and the total RESPIRATORY DISTURBANCE INDEX was  1.6 /hour.  4 events occurred in REM sleep and 2 events in NREM. The REM AHI was  9.6 /hour, versus a non-REM AHI of .4. The patient spent 44.5 minutes of total sleep time in the supine position and 147 minutes in non-supine.. The supine AHI was 0.0 versus a non-supine AHI of 2.0.  OXYGEN SATURATION & C02:  The Wake baseline 02 saturation was 98%, with the lowest being 84%. Time spent below 89% saturation equaled 1 minutes. PERIODIC LIMB MOVEMENTS: The patient had a total of 0 Periodic Limb Movements.  The Periodic Limb Movement (PLM) index was 0 and the PLM Arousal index was 0/hour.  Audio and video analysis did not show any abnormal or unusual movements, behaviors, phonations or vocalizations. The patient took 2 bathroom breaks. Mild to moderate snoring was noted. The EKG was in keeping with normal sinus rhythm (NSR).  Post-study, the patient indicated that sleep was the same as usual.  IMPRESSION:  1. Primary Snoring 2. Dysfunctions associated with sleep stages or arousal from sleep  RECOMMENDATIONS:  1. This study does not demonstrate any significant obstructive or central sleep disordered breathing with the exception of snoring and mild REM sleep related sleep apnea; weight loss is recommended and will likely reduce her snoring and REM-related OSA. This study does not support an intrinsic sleep disorder as a cause  of the patient's symptoms. Other causes, including circadian rhythm disturbances, an underlying mood disorder, medication effect and/or an underlying medical problem cannot be ruled out. Sleep deprivation may, in part, be the cause of her excessive daytime sleepiness.  2. This study shows sleep fragmentation and abnormal sleep stage percentages; these are nonspecific findings and per se do not signify an intrinsic sleep disorder or a cause for the patient's sleep-related symptoms. Causes include (but are not limited to) the first night effect of the sleep study, circadian rhythm disturbances, medication effect or an underlying mood disorder or medical problem.  3. The patient should be cautioned not to drive, work at heights, or operate dangerous or heavy equipment when tired or sleepy. Review and reiteration of good sleep hygiene measures should be pursued with any patient. 4. The patient will be advised to follow up with the referring provider, who will be notified of the test results.  I certify that I have reviewed the entire raw data recording prior to the issuance of this report in accordance with the Standards of Accreditation of the American Academy of Sleep Medicine (AASM)   Star Age, MD, PhD Diplomat, American Board of Neurology and Sleep Medicine (Neurology and Sleep Medicine)

## 2020-10-20 NOTE — Telephone Encounter (Signed)
Pt returned call back and I was able to

## 2020-10-20 NOTE — Telephone Encounter (Signed)
I was able to review the sleep study with the patient in detail. I advised there was no evidence of organic sleep disorder. Patient verbalized understanding of the information. Advised that Dr Rexene Alberts would recommend following up with PCP to address other contributing factors to her interrupted sleep. Patient blames that she is on diuretics which causes her to have to get up multiple times to void at bedtime. Pt asked the results be forwarded to her PCP and kidney MD. She was appreciative for the information.

## 2020-10-20 NOTE — Telephone Encounter (Signed)
-----   Message from Star Age, MD sent at 10/20/2020  8:38 AM EST ----- Patient referred by Dr. Baird Cancer, seen by me on 09/23/20, diagnostic PSG on 10/07/20.   Please call and notify the patient that the recent sleep study did not show any significant obstructive sleep apnea, with the exception of snoring and mild REM sleep related sleep apnea; weight loss is recommended and will likely reduce her snoring and REM-related OSA.  She did not sleep very well. Chronic sleep deprivation may, in part, be the cause of her sleepiness during the day. I also recommend a follow up appointment with her PCP to rule out metabolic or hormonal/medical causes of her sleepiness, such as vitamin B12 or vit D deficiency, anemia, thyroid dysfunction.   Thanks,  Star Age, MD, PhD [facility])

## 2020-10-20 NOTE — Progress Notes (Signed)
Patient referred by Dr. Baird Cancer, seen by me on 09/23/20, diagnostic PSG on 10/07/20.   Please call and notify the patient that the recent sleep study did not show any significant obstructive sleep apnea, with the exception of snoring and mild REM sleep related sleep apnea; weight loss is recommended and will likely reduce her snoring and REM-related OSA.  She did not sleep very well. Chronic sleep deprivation may, in part, be the cause of her sleepiness during the day. I also recommend a follow up appointment with her PCP to rule out metabolic or hormonal/medical causes of her sleepiness, such as vitamin B12 or vit D deficiency, anemia, thyroid dysfunction.   Thanks,  Star Age, MD, PhD [facility])

## 2020-10-20 NOTE — Telephone Encounter (Signed)
I called pt to discuss. No answer, left a message asking her to call me back. 

## 2020-10-22 ENCOUNTER — Encounter (HOSPITAL_COMMUNITY)
Admission: RE | Admit: 2020-10-22 | Discharge: 2020-10-22 | Disposition: A | Discharge status: Home / Self Care | Payer: Medicare Other | Source: Ambulatory Visit | Attending: Internal Medicine | Admitting: Internal Medicine

## 2020-10-22 ENCOUNTER — Other Ambulatory Visit: Payer: Self-pay

## 2020-10-22 VITALS — BP 149/74 | HR 75 | Temp 97.7°F | Resp 20

## 2020-10-22 DIAGNOSIS — N181 Chronic kidney disease, stage 1: Secondary | ICD-10-CM

## 2020-10-22 DIAGNOSIS — D631 Anemia in chronic kidney disease: Secondary | ICD-10-CM

## 2020-10-22 LAB — IRON AND TIBC
Iron: 52 ug/dL (ref 28–170)
Saturation Ratios: 20 % (ref 10.4–31.8)
TIBC: 266 ug/dL (ref 250–450)
UIBC: 214 ug/dL

## 2020-10-22 LAB — FERRITIN: Ferritin: 178 ng/mL (ref 11–307)

## 2020-10-22 LAB — POCT HEMOGLOBIN-HEMACUE: Hemoglobin: 9.6 g/dL — ABNORMAL LOW (ref 12.0–15.0)

## 2020-10-22 MED ORDER — EPOETIN ALFA-EPBX 3000 UNIT/ML IJ SOLN
INTRAMUSCULAR | Status: AC
  Administered 2020-10-22: 3000 [IU] via SUBCUTANEOUS
  Filled 2020-10-22: qty 1

## 2020-10-22 MED ORDER — EPOETIN ALFA-EPBX 10000 UNIT/ML IJ SOLN: 15000.0000 [IU] | INTRAMUSCULAR | Status: DC

## 2020-10-22 MED ORDER — EPOETIN ALFA-EPBX 2000 UNIT/ML IJ SOLN
INTRAMUSCULAR | Status: AC
  Administered 2020-10-22: 2000 [IU] via SUBCUTANEOUS
  Filled 2020-10-22: qty 1

## 2020-10-22 MED ORDER — EPOETIN ALFA-EPBX 10000 UNIT/ML IJ SOLN
INTRAMUSCULAR | Status: AC
  Administered 2020-10-22: 10000 [IU] via SUBCUTANEOUS
  Filled 2020-10-22: qty 1

## 2020-10-28 ENCOUNTER — Encounter: Payer: Self-pay | Admitting: Podiatry

## 2020-10-28 ENCOUNTER — Other Ambulatory Visit: Payer: Self-pay

## 2020-10-28 ENCOUNTER — Ambulatory Visit (INDEPENDENT_AMBULATORY_CARE_PROVIDER_SITE_OTHER): Payer: Medicare Other | Admitting: Podiatry

## 2020-10-28 DIAGNOSIS — M2010 Hallux valgus (acquired), unspecified foot: Secondary | ICD-10-CM | POA: Diagnosis not present

## 2020-10-28 DIAGNOSIS — E119 Type 2 diabetes mellitus without complications: Secondary | ICD-10-CM | POA: Diagnosis not present

## 2020-10-28 DIAGNOSIS — M79674 Pain in right toe(s): Secondary | ICD-10-CM

## 2020-10-28 DIAGNOSIS — B351 Tinea unguium: Secondary | ICD-10-CM

## 2020-10-28 DIAGNOSIS — M79675 Pain in left toe(s): Secondary | ICD-10-CM | POA: Diagnosis not present

## 2020-10-28 NOTE — Progress Notes (Signed)
This patient returns to my office for at risk foot care.  This patient requires this care by a professional since this patient will be at risk due to having type 2 diabetes.  This patient is unable to cut nails herself since the patient cannot reach her nails.These nails are painful walking and wearing shoes.  This patient presents for at risk foot care today.  General Appearance  Alert, conversant and in no acute stress.  Vascular  Dorsalis pedis and posterior tibial  pulses are palpable  bilaterally.  Capillary return is within normal limits  bilaterally. Temperature is within normal limits  bilaterally.  Neurologic  Senn-Weinstein monofilament wire test within normal limits  bilaterally. Muscle power within normal limits bilaterally.  Nails Thick disfigured discolored nails with subungual debris  from hallux to fifth toes bilaterally. No evidence of bacterial infection or drainage bilaterally.  Orthopedic  No limitations of motion  feet .  No crepitus or effusions noted.  HAV  B/L.  Hammer toes second  B/L.  Skin  normotropic skin with no porokeratosis noted bilaterally.  No signs of infections or ulcers noted.     Onychomycosis  Pain in right toes  Pain in left toes  Consent was obtained for treatment procedures.   Mechanical debridement of nails 1-5  bilaterally performed with a nail nipper.  Filed with dremel without incident.    Return office visit    10 weeks                Told patient to return for periodic foot care and evaluation due to potential at risk complications.   Gardiner Barefoot DPM

## 2020-11-04 ENCOUNTER — Other Ambulatory Visit: Payer: Self-pay

## 2020-11-04 ENCOUNTER — Encounter (INDEPENDENT_AMBULATORY_CARE_PROVIDER_SITE_OTHER): Payer: Medicare Other | Admitting: Ophthalmology

## 2020-11-04 DIAGNOSIS — E113592 Type 2 diabetes mellitus with proliferative diabetic retinopathy without macular edema, left eye: Secondary | ICD-10-CM | POA: Diagnosis not present

## 2020-11-04 DIAGNOSIS — H43813 Vitreous degeneration, bilateral: Secondary | ICD-10-CM | POA: Diagnosis not present

## 2020-11-04 DIAGNOSIS — H35033 Hypertensive retinopathy, bilateral: Secondary | ICD-10-CM

## 2020-11-04 DIAGNOSIS — I10 Essential (primary) hypertension: Secondary | ICD-10-CM

## 2020-11-04 DIAGNOSIS — E113511 Type 2 diabetes mellitus with proliferative diabetic retinopathy with macular edema, right eye: Secondary | ICD-10-CM

## 2020-11-05 DIAGNOSIS — N189 Chronic kidney disease, unspecified: Secondary | ICD-10-CM | POA: Diagnosis not present

## 2020-11-05 DIAGNOSIS — E1165 Type 2 diabetes mellitus with hyperglycemia: Secondary | ICD-10-CM | POA: Diagnosis not present

## 2020-11-05 DIAGNOSIS — I10 Essential (primary) hypertension: Secondary | ICD-10-CM | POA: Diagnosis not present

## 2020-11-05 DIAGNOSIS — E7800 Pure hypercholesterolemia, unspecified: Secondary | ICD-10-CM | POA: Diagnosis not present

## 2020-11-05 DIAGNOSIS — E11319 Type 2 diabetes mellitus with unspecified diabetic retinopathy without macular edema: Secondary | ICD-10-CM | POA: Diagnosis not present

## 2020-11-18 ENCOUNTER — Other Ambulatory Visit: Payer: Self-pay

## 2020-11-18 ENCOUNTER — Other Ambulatory Visit: Payer: Medicare Other | Admitting: *Deleted

## 2020-11-18 DIAGNOSIS — E785 Hyperlipidemia, unspecified: Secondary | ICD-10-CM | POA: Diagnosis not present

## 2020-11-19 ENCOUNTER — Telehealth: Payer: Self-pay | Admitting: *Deleted

## 2020-11-19 DIAGNOSIS — E785 Hyperlipidemia, unspecified: Secondary | ICD-10-CM

## 2020-11-19 LAB — LIPID PANEL
Chol/HDL Ratio: 4.6 ratio — ABNORMAL HIGH (ref 0.0–4.4)
Cholesterol, Total: 180 mg/dL (ref 100–199)
HDL: 39 mg/dL — ABNORMAL LOW (ref >39)
LDL Chol Calc (NIH): 113 mg/dL — ABNORMAL HIGH (ref 0–99)
Triglycerides: 155 mg/dL — ABNORMAL HIGH (ref 0–149)
VLDL Cholesterol Cal: 28 mg/dL (ref 5–40)

## 2020-11-19 LAB — HEPATIC FUNCTION PANEL
ALT: 21 IU/L (ref 0–32)
AST: 17 IU/L (ref 0–40)
Albumin: 3.6 g/dL — ABNORMAL LOW (ref 3.7–4.7)
Alkaline Phosphatase: 78 IU/L (ref 44–121)
Bilirubin Total: 0.4 mg/dL (ref 0.0–1.2)
Bilirubin, Direct: 0.13 mg/dL (ref 0.00–0.40)
Total Protein: 6.3 g/dL (ref 6.0–8.5)

## 2020-11-19 MED ORDER — ROSUVASTATIN CALCIUM 20 MG PO TABS: 20.0000 mg | ORAL_TABLET | Freq: Every day | ORAL | 3 refills | Status: DC

## 2020-11-19 NOTE — Telephone Encounter (Signed)
Spoke with pt and made her aware of results and recommendations.  Pt denies any missed doses of her Rosuvastatin.  She will come for labs on 3/9.  Advised to call sooner if any issues with this dose.  Pt verbalized understanding and was in agreement with this plan.

## 2020-11-19 NOTE — Telephone Encounter (Signed)
-----   Message from Belva Crome, MD sent at 11/19/2020 11:26 AM EST ----- Let the patient know the LDL is still > 100. Our goal is < 70 mg/dl. Recommend increasing the Rosuvastatin to 20 mg daily with liver and lipid in 6-8 weeks A copy will be sent to [name], MD

## 2020-11-20 ENCOUNTER — Encounter (HOSPITAL_COMMUNITY): Payer: Medicare Other

## 2020-11-26 ENCOUNTER — Other Ambulatory Visit: Payer: Self-pay

## 2020-11-26 ENCOUNTER — Encounter: Payer: Medicare Other | Attending: Internal Medicine | Admitting: Dietician

## 2020-11-26 DIAGNOSIS — E1165 Type 2 diabetes mellitus with hyperglycemia: Secondary | ICD-10-CM | POA: Insufficient documentation

## 2020-11-26 NOTE — Progress Notes (Signed)
  Medical Nutrition Therapy  Appointment Start time:  56  Appointment End time:  1610  Primary concerns today: Patient is here today with her husband, Dr. Marylynn Pearson.  They state that they need to learn more about nutrition related to diabetes and CKD.  Referral diagnosis: Type 2 Diabetes, renal insufficiency, GERD, morbid obesity, low iron with infusions Preferred learning style:  no preference indicated Learning readiness:   ready, change in progress  NUTRITION ASSESSMENT   Anthropometrics  63" 212 lbs 09/10/2020   Clinical Medical Hx: Type 2 Diabetes since about 2001 (insulin 2021), RI, GERD, obesity Endocrinologist:  Dr. Chalmers Cater She uses a Dexcom CGM. Medications: see list to include 70/30 insulin 35 units q am and 25 units q pm, magnesium, probiotic, milk thistle, calcitrol, vitamin B complex, vitamin D, vitamin C Labs: 09/10/2020 - BUN 47, Creatinine 3.32, Potassium 4.4, eGFR 15, Hgb 9 Phos 4.7, A1C 9.6% (06/19/2020)  Lifestyle & Dietary Hx Patient lives with her husband.  They share the shopping and cooking.  They cut back on salt a while ago. They use spices to season and Mrs. Dash, eat no pork or processed foods and avoid potassium chloride salts and supplements.  Current average weekly physical activity: increased shortness of breath with exercise, hip pain.  She is a retired Network engineer.  24-Hr Dietary Recall First Meal: 8 am egg biscuit today but usually grits or oatmeal (not instant) OR fish on the weekend with grits OR omelet, grits AND applesauce, fit and lively yogurt Snack: fit and lively yogurt Second Meal: applesauce or an apple.  Still full from breakfast and becomes nauseated if she eats too much. Snack: fit and lively yogurt Third Meal: chicken (baked), cabbage or collards or green beans OR chicken vegetable soup OR steak, broccoli, brown rice OR corned beef, cabbage, occasional potatoes OR seafood out to eat today Snack: yogurt Beverages: water, hot tea,  bottled sweet tea (diluted), occasional diet coke with nausea  Estimated Energy Needs Calories: 1500 Protein: 50-60g  NUTRITION DIAGNOSIS  NB-1.1 Food and nutrition-related knowledge deficit As related to nutrition and CKD.  As evidenced by patient report and diet hx.   NUTRITION INTERVENTION  Nutrition education (E-1) on the following topics:  . Low sodium . Low phosphorous and avoid dark soda . Reduced/limit red meat and overall small meat portions, reduced protein . Label reading . Discussed briefly difference between pre dialysis and post dialysis diet. . Nutrition resources for CKD   Handouts Provided Include   NKD National Kidney Diet Dish UP a Kidney-Friendly Meal for patients with CKD (not on dialysis)  Label reading  Learning Style & Readiness for Change Teaching method utilized: Visual & Auditory  Demonstrated degree of understanding via: Teach Back  Barriers to learning/adherence to lifestyle change: none  Goals Established by Pt Follow a low sodium diet. Avoid foods that have Phos... in the ingredient list Choose small portions of meat. Limit your yogurt to 1 per day. Avoid dark soda.  Recommend choosing beverages without sugar.  Water is a good choice.  Resources: [employer] - recipes Kidney.org Calorie King app  Please call or my chart if you have any questions.  MONITORING & EVALUATION Dietary intake, weekly physical activity, and label reading prn.  Next Steps  Patient is to follow up prn, questions via My Chart or phone prn.

## 2020-11-26 NOTE — Patient Instructions (Addendum)
Follow a low sodium diet. Avoid foods that have Phos... in the ingredient list Choose small portions of meat. Limit your yogurt to 1 per day. Avoid dark soda.  Recommend choosing beverages without sugar.  Water is a good choice.     Resources: [employer] - recipes Kidney.org  Calorie King app  Please call or my chart if you have any questions.

## 2020-11-30 ENCOUNTER — Encounter: Payer: Self-pay | Admitting: Dietician

## 2020-12-07 DIAGNOSIS — E11319 Type 2 diabetes mellitus with unspecified diabetic retinopathy without macular edema: Secondary | ICD-10-CM | POA: Diagnosis not present

## 2020-12-07 DIAGNOSIS — N189 Chronic kidney disease, unspecified: Secondary | ICD-10-CM | POA: Diagnosis not present

## 2020-12-07 DIAGNOSIS — I10 Essential (primary) hypertension: Secondary | ICD-10-CM | POA: Diagnosis not present

## 2020-12-07 DIAGNOSIS — E1165 Type 2 diabetes mellitus with hyperglycemia: Secondary | ICD-10-CM | POA: Diagnosis not present

## 2020-12-07 DIAGNOSIS — E7800 Pure hypercholesterolemia, unspecified: Secondary | ICD-10-CM | POA: Diagnosis not present

## 2020-12-22 DIAGNOSIS — D509 Iron deficiency anemia, unspecified: Secondary | ICD-10-CM | POA: Diagnosis not present

## 2020-12-22 DIAGNOSIS — N184 Chronic kidney disease, stage 4 (severe): Secondary | ICD-10-CM | POA: Diagnosis not present

## 2020-12-22 DIAGNOSIS — I120 Hypertensive chronic kidney disease with stage 5 chronic kidney disease or end stage renal disease: Secondary | ICD-10-CM | POA: Diagnosis not present

## 2020-12-22 DIAGNOSIS — E1165 Type 2 diabetes mellitus with hyperglycemia: Secondary | ICD-10-CM | POA: Diagnosis not present

## 2020-12-22 DIAGNOSIS — E1122 Type 2 diabetes mellitus with diabetic chronic kidney disease: Secondary | ICD-10-CM | POA: Diagnosis not present

## 2020-12-22 DIAGNOSIS — E669 Obesity, unspecified: Secondary | ICD-10-CM | POA: Diagnosis not present

## 2020-12-22 DIAGNOSIS — N3281 Overactive bladder: Secondary | ICD-10-CM | POA: Diagnosis not present

## 2020-12-22 DIAGNOSIS — N2581 Secondary hyperparathyroidism of renal origin: Secondary | ICD-10-CM | POA: Diagnosis not present

## 2020-12-22 DIAGNOSIS — E8770 Fluid overload, unspecified: Secondary | ICD-10-CM | POA: Diagnosis not present

## 2020-12-22 DIAGNOSIS — N185 Chronic kidney disease, stage 5: Secondary | ICD-10-CM | POA: Diagnosis not present

## 2020-12-22 DIAGNOSIS — E1129 Type 2 diabetes mellitus with other diabetic kidney complication: Secondary | ICD-10-CM | POA: Diagnosis not present

## 2020-12-22 LAB — HEMOGLOBIN A1C: Hemoglobin A1C: 8.6

## 2020-12-23 ENCOUNTER — Telehealth: Payer: Self-pay | Admitting: Interventional Cardiology

## 2020-12-23 ENCOUNTER — Encounter (INDEPENDENT_AMBULATORY_CARE_PROVIDER_SITE_OTHER): Payer: Medicare Other | Admitting: Ophthalmology

## 2020-12-23 ENCOUNTER — Other Ambulatory Visit: Payer: Self-pay | Admitting: Internal Medicine

## 2020-12-23 DIAGNOSIS — R609 Edema, unspecified: Secondary | ICD-10-CM

## 2020-12-23 NOTE — Telephone Encounter (Signed)
Pt c/o swelling: STAT is pt has developed SOB within 24 hours  1) How much weight have you gained and in what time span? Not any 2)  3)  4)  5) If swelling, where is the swelling located? Left foot and ankle  6) Are you currently taking a fluid pill? Yes 7) Are you currently SOB? noyou have a log of your daily weights (if so, list)? No 8)  9) e you gained 3 pounds in a day or 5 pounds in a week? no 10) Have you traveled recently? no

## 2020-12-23 NOTE — Telephone Encounter (Signed)
Left message to call back  

## 2020-12-24 ENCOUNTER — Other Ambulatory Visit: Payer: Self-pay

## 2020-12-24 ENCOUNTER — Encounter (INDEPENDENT_AMBULATORY_CARE_PROVIDER_SITE_OTHER): Payer: Medicare Other | Admitting: Ophthalmology

## 2020-12-24 DIAGNOSIS — H43813 Vitreous degeneration, bilateral: Secondary | ICD-10-CM | POA: Diagnosis not present

## 2020-12-24 DIAGNOSIS — H35033 Hypertensive retinopathy, bilateral: Secondary | ICD-10-CM

## 2020-12-24 DIAGNOSIS — E113513 Type 2 diabetes mellitus with proliferative diabetic retinopathy with macular edema, bilateral: Secondary | ICD-10-CM

## 2020-12-24 DIAGNOSIS — I10 Essential (primary) hypertension: Secondary | ICD-10-CM | POA: Diagnosis not present

## 2020-12-24 NOTE — Telephone Encounter (Signed)
Left message to call back  

## 2020-12-25 NOTE — Telephone Encounter (Signed)
Patient is returning call.  °

## 2020-12-25 NOTE — Telephone Encounter (Signed)
Left message to call back  

## 2020-12-25 NOTE — Telephone Encounter (Signed)
Spoke with pt and for approx 7 days has noted swelling to left leg knee to ankle with some soreness.Per pt has not done any long distance traveling and has done normal activity Per pt is taking Furosemide 40 mg every day and last  Creatinine was 4.1 was from last week Instructed pt to elevate as much as possible and to watch salt intake Will forward to Dr Tamala Julian for review .Adonis Housekeeper

## 2020-12-25 NOTE — Telephone Encounter (Signed)
Needs bilateral lower extremity doppler to r/o DVT.  Take furosemide 80 mg daily thru the weekend and call Monday.

## 2020-12-28 ENCOUNTER — Encounter: Payer: Self-pay | Admitting: Internal Medicine

## 2020-12-29 ENCOUNTER — Other Ambulatory Visit: Payer: Self-pay

## 2020-12-29 MED ORDER — VALSARTAN-HYDROCHLOROTHIAZIDE 320-12.5 MG PO TABS
1.0000 | ORAL_TABLET | Freq: Every day | ORAL | 2 refills | Status: DC
Start: 2020-12-29 — End: 2021-03-24

## 2020-12-29 NOTE — Telephone Encounter (Signed)
Left message to call back  

## 2020-12-29 NOTE — Telephone Encounter (Signed)
The patient agrees to BLE dopplers to r/o DVT. The does not wish to increase Lasix at this time - she said her swelling did improve a little over the weekend and she will not "waste time peeing all day." Discussed reevaluating need to increase Lasix pending doppler results.  She was grateful for call and agrees with plan.

## 2020-12-29 NOTE — Telephone Encounter (Signed)
Carolyn Russo is returning Katy's call. Please advise.

## 2020-12-30 ENCOUNTER — Other Ambulatory Visit: Payer: Medicare Other

## 2020-12-30 ENCOUNTER — Other Ambulatory Visit: Payer: Self-pay

## 2020-12-30 ENCOUNTER — Ambulatory Visit (HOSPITAL_COMMUNITY)
Admission: RE | Admit: 2020-12-30 | Discharge: 2020-12-30 | Disposition: A | Discharge status: Home / Self Care | Payer: Medicare Other | Source: Ambulatory Visit | Attending: Cardiology | Admitting: Cardiology

## 2020-12-30 DIAGNOSIS — E785 Hyperlipidemia, unspecified: Secondary | ICD-10-CM

## 2020-12-30 DIAGNOSIS — R609 Edema, unspecified: Secondary | ICD-10-CM | POA: Insufficient documentation

## 2020-12-30 LAB — HEPATIC FUNCTION PANEL
ALT: 21 IU/L (ref 0–32)
AST: 23 IU/L (ref 0–40)
Albumin: 3.6 g/dL — ABNORMAL LOW (ref 3.7–4.7)
Alkaline Phosphatase: 82 IU/L (ref 44–121)
Bilirubin Total: 0.2 mg/dL (ref 0.0–1.2)
Bilirubin, Direct: <0.1 mg/dL (ref 0.00–0.40)
Total Protein: 6.4 g/dL (ref 6.0–8.5)

## 2020-12-30 LAB — LIPID PANEL
Chol/HDL Ratio: 3.6 ratio (ref 0.0–4.4)
Cholesterol, Total: 143 mg/dL (ref 100–199)
HDL: 40 mg/dL (ref >39)
LDL Chol Calc (NIH): 80 mg/dL (ref 0–99)
Triglycerides: 131 mg/dL (ref 0–149)
VLDL Cholesterol Cal: 23 mg/dL (ref 5–40)

## 2021-01-01 ENCOUNTER — Telehealth: Payer: Self-pay | Admitting: Interventional Cardiology

## 2021-01-01 NOTE — Telephone Encounter (Signed)
Patient returning call for lab results. 

## 2021-01-01 NOTE — Telephone Encounter (Signed)
Informed pt of results. Pt verbalized understanding. 

## 2021-01-04 ENCOUNTER — Ambulatory Visit (HOSPITAL_COMMUNITY)
Admission: RE | Admit: 2021-01-04 | Discharge: 2021-01-04 | Disposition: A | Discharge status: Home / Self Care | Payer: Medicare Other | Source: Ambulatory Visit | Attending: Internal Medicine | Admitting: Internal Medicine

## 2021-01-04 ENCOUNTER — Other Ambulatory Visit: Payer: Self-pay

## 2021-01-04 VITALS — BP 142/83 | HR 73 | Resp 20

## 2021-01-04 DIAGNOSIS — N181 Chronic kidney disease, stage 1: Secondary | ICD-10-CM | POA: Diagnosis not present

## 2021-01-04 DIAGNOSIS — D631 Anemia in chronic kidney disease: Secondary | ICD-10-CM | POA: Insufficient documentation

## 2021-01-04 LAB — POCT HEMOGLOBIN-HEMACUE: Hemoglobin: 8.1 g/dL — ABNORMAL LOW (ref 12.0–15.0)

## 2021-01-04 LAB — IRON AND TIBC
Iron: 52 ug/dL (ref 28–170)
Saturation Ratios: 20 % (ref 10.4–31.8)
TIBC: 256 ug/dL (ref 250–450)
UIBC: 204 ug/dL

## 2021-01-04 LAB — FERRITIN: Ferritin: 141 ng/mL (ref 11–307)

## 2021-01-04 MED ORDER — EPOETIN ALFA-EPBX 10000 UNIT/ML IJ SOLN
INTRAMUSCULAR | Status: AC
  Administered 2021-01-04: 15000 [IU] via SUBCUTANEOUS
  Filled 2021-01-04: qty 2

## 2021-01-04 MED ORDER — EPOETIN ALFA-EPBX 10000 UNIT/ML IJ SOLN: 15000.0000 [IU] | INTRAMUSCULAR | Status: DC

## 2021-01-26 ENCOUNTER — Ambulatory Visit: Payer: Medicare Other | Admitting: Podiatry

## 2021-02-01 ENCOUNTER — Encounter (INDEPENDENT_AMBULATORY_CARE_PROVIDER_SITE_OTHER): Payer: Medicare Other | Admitting: Ophthalmology

## 2021-02-03 ENCOUNTER — Other Ambulatory Visit: Payer: Self-pay

## 2021-02-03 ENCOUNTER — Other Ambulatory Visit (HOSPITAL_COMMUNITY): Payer: Self-pay

## 2021-02-03 ENCOUNTER — Encounter (INDEPENDENT_AMBULATORY_CARE_PROVIDER_SITE_OTHER): Payer: Medicare Other | Admitting: Ophthalmology

## 2021-02-03 DIAGNOSIS — H35033 Hypertensive retinopathy, bilateral: Secondary | ICD-10-CM

## 2021-02-03 DIAGNOSIS — I10 Essential (primary) hypertension: Secondary | ICD-10-CM | POA: Diagnosis not present

## 2021-02-03 DIAGNOSIS — E113513 Type 2 diabetes mellitus with proliferative diabetic retinopathy with macular edema, bilateral: Secondary | ICD-10-CM

## 2021-02-03 DIAGNOSIS — H43813 Vitreous degeneration, bilateral: Secondary | ICD-10-CM | POA: Diagnosis not present

## 2021-02-04 ENCOUNTER — Ambulatory Visit (HOSPITAL_COMMUNITY)
Admission: RE | Admit: 2021-02-04 | Discharge: 2021-02-04 | Disposition: A | Discharge status: Home / Self Care | Payer: Medicare Other | Source: Ambulatory Visit | Attending: Internal Medicine | Admitting: Internal Medicine

## 2021-02-04 VITALS — BP 152/68 | HR 82 | Temp 96.9°F | Resp 20

## 2021-02-04 DIAGNOSIS — N181 Chronic kidney disease, stage 1: Secondary | ICD-10-CM | POA: Diagnosis not present

## 2021-02-04 DIAGNOSIS — D631 Anemia in chronic kidney disease: Secondary | ICD-10-CM | POA: Diagnosis not present

## 2021-02-04 LAB — IRON AND TIBC
Iron: 62 ug/dL (ref 28–170)
Saturation Ratios: 21 % (ref 10.4–31.8)
TIBC: 293 ug/dL (ref 250–450)
UIBC: 231 ug/dL

## 2021-02-04 LAB — FERRITIN: Ferritin: 124 ng/mL (ref 11–307)

## 2021-02-04 LAB — POCT HEMOGLOBIN-HEMACUE: Hemoglobin: 8 g/dL — ABNORMAL LOW (ref 12.0–15.0)

## 2021-02-04 MED ORDER — EPOETIN ALFA-EPBX 10000 UNIT/ML IJ SOLN
15000.0000 [IU] | INTRAMUSCULAR | Status: DC
  Administered 2021-02-04: 15000 [IU] via SUBCUTANEOUS

## 2021-02-04 MED ORDER — EPOETIN ALFA-EPBX 10000 UNIT/ML IJ SOLN
INTRAMUSCULAR | Status: AC
  Filled 2021-02-04: qty 2

## 2021-02-08 ENCOUNTER — Other Ambulatory Visit: Payer: Self-pay | Admitting: Internal Medicine

## 2021-02-10 ENCOUNTER — Ambulatory Visit: Payer: Medicare Other | Admitting: Internal Medicine

## 2021-02-16 ENCOUNTER — Other Ambulatory Visit: Payer: Self-pay

## 2021-02-16 ENCOUNTER — Ambulatory Visit: Payer: Medicare Other | Admitting: Internal Medicine

## 2021-02-16 MED ORDER — ALBUTEROL SULFATE HFA 108 (90 BASE) MCG/ACT IN AERS: 2.0000 | INHALATION_SPRAY | RESPIRATORY_TRACT | 3 refills | Status: DC | PRN

## 2021-02-17 ENCOUNTER — Ambulatory Visit: Payer: Medicare Other | Admitting: Nurse Practitioner

## 2021-02-17 ENCOUNTER — Ambulatory Visit (INDEPENDENT_AMBULATORY_CARE_PROVIDER_SITE_OTHER): Payer: Medicare Other | Admitting: Nurse Practitioner

## 2021-02-17 ENCOUNTER — Other Ambulatory Visit: Payer: Self-pay

## 2021-02-17 VITALS — BP 144/86 | HR 74 | Temp 98.1°F | Ht 59.8 in | Wt 218.6 lb

## 2021-02-17 DIAGNOSIS — Z6841 Body Mass Index (BMI) 40.0 and over, adult: Secondary | ICD-10-CM | POA: Diagnosis not present

## 2021-02-17 DIAGNOSIS — E7800 Pure hypercholesterolemia, unspecified: Secondary | ICD-10-CM | POA: Diagnosis not present

## 2021-02-17 DIAGNOSIS — Z794 Long term (current) use of insulin: Secondary | ICD-10-CM | POA: Diagnosis not present

## 2021-02-17 DIAGNOSIS — E1122 Type 2 diabetes mellitus with diabetic chronic kidney disease: Secondary | ICD-10-CM | POA: Diagnosis not present

## 2021-02-17 DIAGNOSIS — I129 Hypertensive chronic kidney disease with stage 1 through stage 4 chronic kidney disease, or unspecified chronic kidney disease: Secondary | ICD-10-CM

## 2021-02-17 DIAGNOSIS — K5909 Other constipation: Secondary | ICD-10-CM | POA: Diagnosis not present

## 2021-02-17 DIAGNOSIS — I1310 Hypertensive heart and chronic kidney disease without heart failure, with stage 1 through stage 4 chronic kidney disease, or unspecified chronic kidney disease: Secondary | ICD-10-CM | POA: Diagnosis not present

## 2021-02-17 DIAGNOSIS — N183 Chronic kidney disease, stage 3 unspecified: Secondary | ICD-10-CM

## 2021-02-17 DIAGNOSIS — N1832 Chronic kidney disease, stage 3b: Secondary | ICD-10-CM | POA: Diagnosis not present

## 2021-02-17 NOTE — Patient Instructions (Signed)
Diabetes Mellitus and Exercise Exercising regularly is important for overall health, especially for people who have diabetes mellitus. Exercising is not only about losing weight. It has many other health benefits, such as increasing muscle strength and bone density and reducing body fat and stress. This leads to improved fitness, flexibility, and endurance, all of which result in better overall health. What are the benefits of exercise if I have diabetes? Exercise has many benefits for people with diabetes. They include:  Helping to lower and control blood sugar (glucose).  Helping the body to respond better to the hormone insulin by improving insulin sensitivity.  Reducing how much insulin the body needs.  Lowering the risk for heart disease by: ? Lowering "bad" cholesterol and triglyceride levels. ? Increasing "good" cholesterol levels. ? Lowering blood pressure. ? Lowering blood glucose levels. What is my activity plan? Your health care provider or certified diabetes educator can help you make a plan for the type and frequency of exercise that works for you. This is called your activity plan. Be sure to:  Get at least 150 minutes of medium-intensity or high-intensity exercise each week. Exercises may include brisk walking, biking, or water aerobics.  Do stretching and strengthening exercises, such as yoga or weight lifting, at least 2 times a week.  Spread out your activity over at least 3 days of the week.  Get some form of physical activity each day. ? Do not go more than 2 days in a row without some kind of physical activity. ? Avoid being inactive for more than 90 minutes at a time. Take frequent breaks to walk or stretch.  Choose exercises or activities that you enjoy. Set realistic goals.  Start slowly and gradually increase your exercise intensity over time.   How do I manage my diabetes during exercise? Monitor your blood glucose  Check your blood glucose before and  after exercising. If your blood glucose is: ? 240 mg/dL (13.3 mmol/L) or higher before you exercise, check your urine for ketones. These are chemicals created by the liver. If you have ketones in your urine, do not exercise until your blood glucose returns to normal. ? 100 mg/dL (5.6 mmol/L) or lower, eat a snack containing 15-20 grams of carbohydrate. Check your blood glucose 15 minutes after the snack to make sure that your glucose level is above 100 mg/dL (5.6 mmol/L) before you start your exercise.  Know the symptoms of low blood glucose (hypoglycemia) and how to treat it. Your risk for hypoglycemia increases during and after exercise. Follow these tips and your health care provider's instructions  Keep a carbohydrate snack that is fast-acting for use before, during, and after exercise to help prevent or treat hypoglycemia.  Avoid injecting insulin into areas of the body that are going to be exercised. For example, avoid injecting insulin into: ? Your arms, when you are about to play tennis. ? Your legs, when you are about to go jogging.  Keep records of your exercise habits. Doing this can help you and your health care provider adjust your diabetes management plan as needed. Write down: ? Food that you eat before and after you exercise. ? Blood glucose levels before and after you exercise. ? The type and amount of exercise you have done.  Work with your health care provider when you start a new exercise or activity. He or she may need to: ? Make sure that the activity is safe for you. ? Adjust your insulin, other medicines, and food that   you eat.  Drink plenty of water while you exercise. This prevents loss of water (dehydration) and problems caused by a lot of heat in the body (heat stroke).   Where to find more information  American Diabetes Association: www.diabetes.org Summary  Exercising regularly is important for overall health, especially for people who have diabetes  mellitus.  Exercising has many health benefits. It increases muscle strength and bone density and reduces body fat and stress. It also lowers and controls blood glucose.  Your health care provider or certified diabetes educator can help you make an activity plan for the type and frequency of exercise that works for you.  Work with your health care provider to make sure any new activity is safe for you. Also work with your health care provider to adjust your insulin, other medicines, and the food you eat. This information is not intended to replace advice given to you by your health care provider. Make sure you discuss any questions you have with your health care provider. Document Revised: 07/08/2019 Document Reviewed: 07/08/2019 Elsevier Patient Education  2021 Elsevier Inc.  

## 2021-02-17 NOTE — Progress Notes (Signed)
I,Tianna Badgett,acting as a Education administrator for [employer], NP.,have documented all relevant documentation on the behalf of [employer], NP,as directed by  Bary Castilla, NP while in the presence of Bary Castilla, NP.  This visit occurred during the SARS-CoV-2 public health emergency.  Safety protocols were in place, including screening questions prior to the visit, additional usage of staff PPE, and extensive cleaning of exam room while observing appropriate contact time as indicated for disinfecting solutions.  Subjective:     Patient ID: Carolyn Russo , female    DOB: May 20, 1951 , 70 y.o.   MRN: [id number]   Chief Complaint  Patient presents with  . Diabetes  . Asthma    HPI  She presents today for f/u diabetes/HTN. Also followed by Dr. Chalmers Cater.  She reports compliance with meds. Did see a neurologist in Dec and she was not diagnosed with sleep apnea. She is also followed by a cardiologist and a nephrologist. She does stay constipated. And just recently she was having hard time with her breathing but she is using her inhaler which is helping her. She is not wheezing but does get short of breath at times. She is not taking lasix everyday. Will recommend her to take lasix 40 mg 3 times a week twice daily. Will recommend her to use miralex 3 times a week along with colace daily.   Miralex 3 times week.  Colace daily   Twice daily 3 days a week for Lasix   Diabetes She presents for her follow-up diabetic visit. She has type 2 diabetes mellitus. Pertinent negatives for hypoglycemia include no headaches or seizures. Pertinent negatives for diabetes include no chest pain, no fatigue and no weakness. She participates in exercise intermittently. Eye exam is current.  Asthma She complains of shortness of breath. There is no wheezing. Pertinent negatives include no chest pain, fever, headaches, myalgias or sore throat. Her past medical history is significant for asthma.   Hypertension This is a chronic problem. The current episode started more than 1 year ago. The problem has been gradually improving since onset. The problem is uncontrolled. Associated symptoms include shortness of breath. Pertinent negatives include no chest pain, headaches or palpitations. Risk factors for coronary artery disease include diabetes mellitus, dyslipidemia, obesity, post-menopausal state and sedentary lifestyle. The current treatment provides moderate improvement. Compliance problems include exercise.  Hypertensive end-organ damage includes kidney disease.     Past Medical History:  Diagnosis Date  . Allergy   . Anterior chest wall pain   . Appendicitis 1965  . Asthma   . Body mass index 37.0-37.9, adult   . Breast pain   . Cataract    both eyes  . Chronic renal disease, stage II   . Dehydration 2014  . Deviated septum 1971  . Diabetes mellitus   . Dyspnea 2014  . Extrinsic asthma    WITH ASTHMA ATTACK  . Fibroid 1980  . GERD (gastroesophageal reflux disease)   . Heart murmur   . Hx gestational diabetes   . Hyperlipidemia   . Hypertension 2014  . Inguinal hernia 1959  . Malaise and fatigue 2014  . Nephropathy   . Non-IgE mediated allergic asthma 2014  . Obesity   . Pelvic pain   . Pregnancy, high-risk 1985  . Tonsillitis 1968  . Uterine fibroid 1980     Family History  Problem Relation Age of Onset  . Cancer Mother        abdominal melamona  . Psoriasis Mother   .  Alzheimer's disease Father   . Cancer Cousin        colon   . Diabetes Cousin   . Diabetes Maternal Aunt   . Colon cancer Neg Hx   . Colon polyps Neg Hx   . Esophageal cancer Neg Hx   . Rectal cancer Neg Hx   . Stomach cancer Neg Hx   . Breast cancer Neg Hx      Current Outpatient Medications:  .  albuterol (VENTOLIN HFA) 108 (90 Base) MCG/ACT inhaler, Inhale 2 puffs into the lungs every 4 (four) hours as needed for wheezing or shortness of breath., Disp: 1 each, Rfl: 3 .  aspirin  81 MG tablet, Take 81 mg by mouth daily., Disp: , Rfl:  .  b complex vitamins capsule, Take 1 capsule by mouth daily., Disp: , Rfl:  .  BESIVANCE 0.6 % SUSP, INSTILL ONE GTT IN RIGHT EYE QID FOR 2 DAYS AFTER EACH MONTHLY EYE INJECTION, Disp: , Rfl:  .  calcitRIOL (ROCALTROL) 0.25 MCG capsule, Take 0.25 mcg by mouth 3 (three) times a week., Disp: , Rfl:  .  Cholecalciferol (VITAMIN D3 PO), Take 2,000 Int'l Units by mouth daily., Disp: , Rfl:  .  Continuous Blood Gluc Receiver (DEXCOM G6 RECEIVER) DEVI, Use as directed to check blood sugars 3 times per day dx: e11.65, Disp: 1 each, Rfl: 1 .  Continuous Blood Gluc Sensor (DEXCOM G6 SENSOR) MISC, Use as directed to check blood sugars 3 times per day dx: e11.65, Disp: 3 each, Rfl: 2 .  Continuous Blood Gluc Transmit (DEXCOM G6 TRANSMITTER) MISC, Use as directed to check blood sugars 3 times per day dx: e11.65, Disp: 1 each, Rfl: 1 .  [employer] CINNAMON PO, Take 250 mg by mouth 2 (two) times daily., Disp: , Rfl:  .  fluticasone (FLONASE) 50 MCG/ACT nasal spray, Place 2 sprays into the nose daily as needed for allergies. , Disp: , Rfl:  .  furosemide (LASIX) 40 MG tablet, Take 1/2 to 1 tablet by mouth daily, Disp: , Rfl:  .  insulin aspart protamine - aspart (NOVOLOG MIX 70/30 FLEXPEN) (70-30) 100 UNIT/ML FlexPen, Inject 0.3 mLs (30 Units total) into the skin 2 (two) times daily with a meal. (Patient taking differently: Inject into the skin 2 (two) times daily with a meal. 35 units in the morning and 25 units at night), Disp: 15 mL, Rfl: 0 .  lactobacillus acidophilus (BACID) TABS, Take 1 tablet by mouth daily. PROBIOTIC, Disp: , Rfl:  .  magnesium oxide (MAG-OX) 400 (241.3 Mg) MG tablet, Take 1 tablet by mouth daily., Disp: , Rfl:  .  milk thistle 175 MG tablet, Take 525 mg by mouth daily. Takes 3 daily, Disp: , Rfl:  .  OneTouch Delica Lancets 34K MISC, Use as directed to check blood sugars before breakfast and dinner dx: e11.65, Disp: 100 each, Rfl: 11 .   ONETOUCH VERIO test strip, CHECK BLOOD SUGAR TWICE DAILY AS DIRECTED, Disp: 50 strip, Rfl: 3 .  polyethylene glycol (MIRALAX / GLYCOLAX) 17 g packet, Take 17 g by mouth daily., Disp: 14 each, Rfl: 0 .  Polyvinyl Alcohol-Povidone (REFRESH OP), Apply to eye., Disp: , Rfl:  .  rosuvastatin (CRESTOR) 20 MG tablet, Take 1 tablet (20 mg total) by mouth daily., Disp: 90 tablet, Rfl: 3 .  SYMBICORT 160-4.5 MCG/ACT inhaler, Inhale 2 puffs into the lungs 2 (two) times daily., Disp: 10.2 g, Rfl: 5 .  valsartan-hydrochlorothiazide (DIOVAN-HCT) 320-12.5 MG tablet, Take 1 tablet by  mouth daily., Disp: 90 tablet, Rfl: 2 .  vitamin C (ASCORBIC ACID) 500 MG tablet, Take 500 mg by mouth daily., Disp: , Rfl:    Allergies  Allergen Reactions  . Food Anaphylaxis    Peanuts; Almonds  . Other Shortness Of Breath    Peanuts; Almonds  . Wheat Bran Shortness Of Breath  . Statins Itching and Other (See Comments)    Generalized aches  . Iodine Other (See Comments)  . Shellfish Allergy Other (See Comments)    Mouth gets raw  . Tetracycline Other (See Comments)    Raw mouth  . Tetracyclines & Related Itching  . Contrast Media [Iodinated Diagnostic Agents] Rash    Happened during CT scan over 30 years ago     Review of Systems  Constitutional: Negative for fatigue and fever.  HENT: Negative for sinus pressure and sore throat.   Respiratory: Positive for shortness of breath. Negative for choking, chest tightness and wheezing.   Cardiovascular: Positive for leg swelling. Negative for chest pain and palpitations.  Gastrointestinal: Negative.  Negative for diarrhea and nausea.  Genitourinary: Negative for flank pain.  Musculoskeletal: Negative for arthralgias and myalgias.  Neurological: Negative.  Negative for seizures, weakness, numbness and headaches.     Today's Vitals   02/17/21 1117  BP: (!) 144/86  Pulse: 74  Temp: 98.1 F (36.7 C)  TempSrc: Oral  SpO2: 98%  Weight: 218 lb 9.6 oz (99.2 kg)   Height: 4' 11.8" (1.519 m)   Body mass index is 42.98 kg/m.   Objective:  Physical Exam Constitutional:      Appearance: Normal appearance. She is obese.  HENT:     Head: Normocephalic and atraumatic.  Cardiovascular:     Rate and Rhythm: Normal rate and regular rhythm.     Pulses: Normal pulses.     Heart sounds: Normal heart sounds. No murmur heard.   Pulmonary:     Effort: Pulmonary effort is normal. No respiratory distress.     Breath sounds: Normal breath sounds. No wheezing.  Musculoskeletal:     Right lower leg: 1+ Edema present.     Left lower leg: 1+ Edema present.  Skin:    General: Skin is warm and dry.     Capillary Refill: Capillary refill takes less than 2 seconds.  Neurological:     Mental Status: She is alert and oriented to person, place, and time.  Psychiatric:        Mood and Affect: Mood normal.        Behavior: Behavior normal.        Assessment And Plan:     1. Type 2 diabetes mellitus with stage 3b chronic kidney disease, with long-term current use of insulin (HCC) -Chronic -Continue with current regimen  -Follow up with nutrition and diabetes.  --Discussed with patient the importance of glycemic control and long term complications from uncontrolled diabetes. Discussed with the patient the importance of compliance with home glucose monitoring, diet which includes decrease amount of sugary drinks and foods. Importance of exercise was also discussed with the patient. Importance of eye exams, self foot care and compliance to office visits was also discussed with the patient.  - CMP14+EGFR - CBC no Diff - Hemoglobin A1c  2. Hypertensive kidney disease with stage 3 chronic kidney disease, unspecified whether stage 3a or 3b CKD (HCC) -Chronic, stable -Patient advised to take lasix 40 mg BID three times weekly due to increased lower leg bilateral edema.  -Follow up with  cardiologist.  .-Limit the intake of processed foods and salt intake. You should  increase your intake of green vegetables and fruits. Limit the use of alcohol. Limit fast foods and fried foods. Avoid high fatty saturated and trans fat foods. Keep yourself hydrated with drinking water. Avoid red meats. Eat lean meats instead. Exercise for atleast 30-45 min for atleast 4-5 times a week.  - CMP14+EGFR - CBC no Diff  3. Pure hypercholesterolemia -Chronic, continue med  -Will check lipid panel.  - Lipid panel  4. Chronic constipation -Chronic  -Miralex 3 times weekly  -Colace daily as needed.   5. Class 3 severe obesity due to excess calories with serious comorbidity and body mass index (BMI) of 40.0 to 44.9 in adult [facility]) -Patient was advised to eat a healthy diet and exercise as much as tolerated.   Staying healthy and adopting a healthy lifestyle for your overall health is important. You should eat 7 or more servings of fruits and vegetables per day.  Limit your intake of animal fats especially for elevated cholesterol. Avoid highly processed food and limit your salt intake if you have hypertension. Avoid foods high in saturated/Trans fats. Along with a healthy diet it is also very important to maintain time for yourself to maintain a healthy mental health with low stress levels. You should get atleast 150 min of moderate intensity exercise weekly for a healthy heart. Along with eating right and exercising, aim for at least 7-9 hours of sleep daily.  Eat more whole grains which includes barley, wheat berries, oats, brown rice and whole wheat pasta. Use healthy plant oils which include olive, soy, corn, sunflower and peanut. Limit your caffeine and sugary drinks. Limit your intake of fast foods. Limit milk and dairy products to one or two daily servings.   Patient was given opportunity to ask questions. Patient verbalized understanding of the plan and was able to repeat key elements of the plan. All questions were answered to their satisfaction.  Bary Castilla, DNP   I,  Bary Castilla, DNP  have reviewed all documentation for this visit. The documentation on 02/17/21  for the exam, diagnosis, procedures, and orders are all accurate and complete.    IF YOU HAVE BEEN REFERRED TO A SPECIALIST, IT MAY TAKE 1-2 WEEKS TO SCHEDULE/PROCESS THE REFERRAL. IF YOU HAVE NOT HEARD FROM US/SPECIALIST IN TWO WEEKS, PLEASE GIVE [place] A CALL AT [phone].   THE PATIENT IS ENCOURAGED TO PRACTICE SOCIAL DISTANCING DUE TO THE COVID-19 PANDEMIC.

## 2021-02-18 LAB — CBC
Hematocrit: 24 % — ABNORMAL LOW (ref 34.0–46.6)
Hemoglobin: 8 g/dL — ABNORMAL LOW (ref 11.1–15.9)
MCH: 28.2 pg (ref 26.6–33.0)
MCHC: 33.3 g/dL (ref 31.5–35.7)
MCV: 85 fL (ref 79–97)
Platelets: 376 10*3/uL (ref 150–450)
RBC: 2.84 x10E6/uL — ABNORMAL LOW (ref 3.77–5.28)
RDW: 13.4 % (ref 11.7–15.4)
WBC: 8.8 10*3/uL (ref 3.4–10.8)

## 2021-02-18 LAB — CMP14+EGFR
ALT: 25 IU/L (ref 0–32)
AST: 14 IU/L (ref 0–40)
Albumin/Globulin Ratio: 1.1 — ABNORMAL LOW (ref 1.2–2.2)
Albumin: 3.3 g/dL — ABNORMAL LOW (ref 3.7–4.7)
Alkaline Phosphatase: 79 IU/L (ref 44–121)
BUN/Creatinine Ratio: 10 — ABNORMAL LOW (ref 12–28)
BUN: 52 mg/dL — ABNORMAL HIGH (ref 8–27)
Bilirubin Total: 0.2 mg/dL (ref 0.0–1.2)
CO2: 17 mmol/L — ABNORMAL LOW (ref 20–29)
Calcium: 8.4 mg/dL — ABNORMAL LOW (ref 8.7–10.3)
Chloride: 112 mmol/L — ABNORMAL HIGH (ref 96–106)
Creatinine, Ser: 5.05 mg/dL — ABNORMAL HIGH (ref 0.57–1.00)
Globulin, Total: 3 g/dL (ref 1.5–4.5)
Glucose: 82 mg/dL (ref 65–99)
Potassium: 4.6 mmol/L (ref 3.5–5.2)
Sodium: 145 mmol/L — ABNORMAL HIGH (ref 134–144)
Total Protein: 6.3 g/dL (ref 6.0–8.5)
eGFR: 9 mL/min/{1.73_m2} — ABNORMAL LOW (ref >59)

## 2021-02-18 LAB — HEMOGLOBIN A1C
Est. average glucose Bld gHb Est-mCnc: 131 mg/dL
Hgb A1c MFr Bld: 6.2 % — ABNORMAL HIGH (ref 4.8–5.6)

## 2021-02-18 LAB — LIPID PANEL
Chol/HDL Ratio: 4 ratio (ref 0.0–4.4)
Cholesterol, Total: 158 mg/dL (ref 100–199)
HDL: 40 mg/dL (ref >39)
LDL Chol Calc (NIH): 99 mg/dL (ref 0–99)
Triglycerides: 103 mg/dL (ref 0–149)
VLDL Cholesterol Cal: 19 mg/dL (ref 5–40)

## 2021-02-22 DIAGNOSIS — E1122 Type 2 diabetes mellitus with diabetic chronic kidney disease: Secondary | ICD-10-CM | POA: Diagnosis not present

## 2021-02-22 DIAGNOSIS — N185 Chronic kidney disease, stage 5: Secondary | ICD-10-CM | POA: Diagnosis not present

## 2021-02-22 DIAGNOSIS — N2581 Secondary hyperparathyroidism of renal origin: Secondary | ICD-10-CM | POA: Diagnosis not present

## 2021-02-22 DIAGNOSIS — E669 Obesity, unspecified: Secondary | ICD-10-CM | POA: Diagnosis not present

## 2021-02-22 DIAGNOSIS — N3281 Overactive bladder: Secondary | ICD-10-CM | POA: Diagnosis not present

## 2021-02-22 DIAGNOSIS — D509 Iron deficiency anemia, unspecified: Secondary | ICD-10-CM | POA: Diagnosis not present

## 2021-02-22 DIAGNOSIS — I120 Hypertensive chronic kidney disease with stage 5 chronic kidney disease or end stage renal disease: Secondary | ICD-10-CM | POA: Diagnosis not present

## 2021-02-22 DIAGNOSIS — E8770 Fluid overload, unspecified: Secondary | ICD-10-CM | POA: Diagnosis not present

## 2021-02-25 ENCOUNTER — Other Ambulatory Visit: Payer: Self-pay

## 2021-02-25 MED ORDER — DULERA 200-5 MCG/ACT IN AERO: 2.0000 | INHALATION_SPRAY | Freq: Two times a day (BID) | RESPIRATORY_TRACT | 1 refills | Status: DC

## 2021-02-26 ENCOUNTER — Other Ambulatory Visit: Payer: Self-pay | Admitting: Internal Medicine

## 2021-02-26 MED ORDER — SYMBICORT 160-4.5 MCG/ACT IN AERO: 2.0000 | INHALATION_SPRAY | Freq: Two times a day (BID) | RESPIRATORY_TRACT | 5 refills | Status: DC

## 2021-03-01 ENCOUNTER — Other Ambulatory Visit: Payer: Self-pay

## 2021-03-01 MED ORDER — ALBUTEROL SULFATE HFA 108 (90 BASE) MCG/ACT IN AERS: 2.0000 | INHALATION_SPRAY | RESPIRATORY_TRACT | 3 refills | Status: DC | PRN

## 2021-03-04 ENCOUNTER — Ambulatory Visit: Payer: Medicare Other

## 2021-03-04 ENCOUNTER — Other Ambulatory Visit: Payer: Self-pay

## 2021-03-04 ENCOUNTER — Ambulatory Visit (HOSPITAL_COMMUNITY)
Admission: RE | Admit: 2021-03-04 | Discharge: 2021-03-04 | Disposition: A | Discharge status: Home / Self Care | Payer: Medicare Other | Source: Ambulatory Visit | Attending: Internal Medicine | Admitting: Internal Medicine

## 2021-03-04 VITALS — BP 175/72 | HR 80 | Temp 97.0°F | Resp 20

## 2021-03-04 VITALS — BP 144/90 | HR 83

## 2021-03-04 DIAGNOSIS — R0600 Dyspnea, unspecified: Secondary | ICD-10-CM

## 2021-03-04 DIAGNOSIS — D631 Anemia in chronic kidney disease: Secondary | ICD-10-CM | POA: Diagnosis not present

## 2021-03-04 DIAGNOSIS — R0609 Other forms of dyspnea: Secondary | ICD-10-CM

## 2021-03-04 DIAGNOSIS — N181 Chronic kidney disease, stage 1: Secondary | ICD-10-CM | POA: Insufficient documentation

## 2021-03-04 LAB — IRON AND TIBC
Iron: 40 ug/dL (ref 28–170)
Saturation Ratios: 14 % (ref 10.4–31.8)
TIBC: 295 ug/dL (ref 250–450)
UIBC: 255 ug/dL

## 2021-03-04 LAB — FERRITIN: Ferritin: 93 ng/mL (ref 11–307)

## 2021-03-04 MED ORDER — EPOETIN ALFA-EPBX 40000 UNIT/ML IJ SOLN
30000.0000 [IU] | Freq: Once | INTRAMUSCULAR | Status: AC
  Administered 2021-03-04: 30000 [IU] via SUBCUTANEOUS

## 2021-03-04 MED ORDER — EPOETIN ALFA-EPBX 10000 UNIT/ML IJ SOLN: 15000.0000 [IU] | INTRAMUSCULAR | Status: DC

## 2021-03-04 MED ORDER — EPOETIN ALFA-EPBX 10000 UNIT/ML IJ SOLN
INTRAMUSCULAR | Status: AC
  Filled 2021-03-04: qty 2

## 2021-03-04 MED ORDER — ALBUTEROL SULFATE (2.5 MG/3ML) 0.083% IN NEBU: 2.5000 mg | INHALATION_SOLUTION | RESPIRATORY_TRACT | 2 refills | Status: DC | PRN

## 2021-03-04 NOTE — Progress Notes (Signed)
I,Carolyn Russo,acting as a Education administrator for Carolyn Brands, NP.,have documented all relevant documentation on the behalf of Carolyn Brands, NP,as directed by  Carolyn Castilla, NP while in the presence of Carolyn Castilla, NP.  This visit occurred during the SARS-CoV-2 public health emergency.  Safety protocols were in place, including screening questions prior to the visit, additional usage of staff PPE, and extensive cleaning of exam room while observing appropriate contact time as indicated for disinfecting solutions.  Subjective:     Patient ID: Carolyn Russo , female    DOB: 1951-06-26 , 70 y.o.   MRN: [id number]   Chief Complaint  Patient presents with  . Asthma    HPI  Patient is here for a follow up of her asthma. She states that she is having to use her inhaler more often than normal.     Past Medical History:  Diagnosis Date  . Allergy   . Anterior chest wall pain   . Appendicitis 1965  . Asthma   . Body mass index 37.0-37.9, adult   . Breast pain   . Cataract    both eyes  . Chronic renal disease, stage II   . Dehydration 2014  . Deviated septum 1971  . Diabetes mellitus   . Dyspnea 2014  . Extrinsic asthma    WITH ASTHMA ATTACK  . Fibroid 1980  . GERD (gastroesophageal reflux disease)   . Heart murmur   . Hx gestational diabetes   . Hyperlipidemia   . Hypertension 2014  . Inguinal hernia 1959  . Malaise and fatigue 2014  . Nephropathy   . Non-IgE mediated allergic asthma 2014  . Obesity   . Pelvic pain   . Pregnancy, high-risk 1985  . Tonsillitis 1968  . Uterine fibroid 1980     Family History  Problem Relation Age of Onset  . Cancer Mother        abdominal melamona  . Psoriasis Mother   . Alzheimer's disease Father   . Cancer Cousin        colon   . Diabetes Cousin   . Diabetes Maternal Aunt   . Colon cancer Neg Hx   . Colon polyps Neg Hx   . Esophageal cancer Neg Hx   . Rectal cancer Neg Hx   . Stomach cancer Neg Hx   . Breast  cancer Neg Hx      Current Outpatient Medications:  .  albuterol (VENTOLIN HFA) 108 (90 Base) MCG/ACT inhaler, Inhale 2 puffs into the lungs every 4 (four) hours as needed for wheezing or shortness of breath., Disp: 18 g, Rfl: 3 .  aspirin 81 MG tablet, Take 81 mg by mouth daily., Disp: , Rfl:  .  b complex vitamins capsule, Take 1 capsule by mouth daily., Disp: , Rfl:  .  BESIVANCE 0.6 % SUSP, INSTILL ONE GTT IN RIGHT EYE QID FOR 2 DAYS AFTER EACH MONTHLY EYE INJECTION, Disp: , Rfl:  .  calcitRIOL (ROCALTROL) 0.25 MCG capsule, Take 0.25 mcg by mouth 3 (three) times a week., Disp: , Rfl:  .  Cholecalciferol (VITAMIN D3 PO), Take 2,000 Int'l Units by mouth daily., Disp: , Rfl:  .  Continuous Blood Gluc Receiver (DEXCOM G6 RECEIVER) DEVI, Use as directed to check blood sugars 3 times per day dx: e11.65, Disp: 1 each, Rfl: 1 .  Continuous Blood Gluc Sensor (DEXCOM G6 SENSOR) MISC, Use as directed to check blood sugars 3 times per day dx: e11.65, Disp: 3 each, Rfl: 2 .  Continuous Blood Gluc Transmit (DEXCOM G6 TRANSMITTER) MISC, Use as directed to check blood sugars 3 times per day dx: e11.65, Disp: 1 each, Rfl: 1 .  [employer] CINNAMON PO, Take 250 mg by mouth 2 (two) times daily., Disp: , Rfl:  .  fluticasone (FLONASE) 50 MCG/ACT nasal spray, Place 2 sprays into the nose daily as needed for allergies. , Disp: , Rfl:  .  furosemide (LASIX) 40 MG tablet, Take 1/2 to 1 tablet by mouth daily, Disp: , Rfl:  .  insulin aspart protamine - aspart (NOVOLOG MIX 70/30 FLEXPEN) (70-30) 100 UNIT/ML FlexPen, Inject 0.3 mLs (30 Units total) into the skin 2 (two) times daily with a meal. (Patient taking differently: Inject into the skin 2 (two) times daily with a meal. 35 units in the morning and 25 units at night), Disp: 15 mL, Rfl: 0 .  lactobacillus acidophilus (BACID) TABS, Take 1 tablet by mouth daily. PROBIOTIC, Disp: , Rfl:  .  magnesium oxide (MAG-OX) 400 (241.3 Mg) MG tablet, Take 1 tablet by mouth daily., Disp:  , Rfl:  .  milk thistle 175 MG tablet, Take 525 mg by mouth daily. Takes 3 daily, Disp: , Rfl:  .  mometasone-formoterol (DULERA) 200-5 MCG/ACT AERO, Inhale 2 puffs into the lungs 2 (two) times daily., Disp: 1 each, Rfl: 1 .  OneTouch Delica Lancets 81W MISC, Use as directed to check blood sugars before breakfast and dinner dx: e11.65, Disp: 100 each, Rfl: 11 .  ONETOUCH VERIO test strip, CHECK BLOOD SUGAR TWICE DAILY AS DIRECTED, Disp: 50 strip, Rfl: 3 .  polyethylene glycol (MIRALAX / GLYCOLAX) 17 g packet, Take 17 g by mouth daily., Disp: 14 each, Rfl: 0 .  Polyvinyl Alcohol-Povidone (REFRESH OP), Apply to eye., Disp: , Rfl:  .  rosuvastatin (CRESTOR) 20 MG tablet, Take 1 tablet (20 mg total) by mouth daily., Disp: 90 tablet, Rfl: 3 .  SYMBICORT 160-4.5 MCG/ACT inhaler, Inhale 2 puffs into the lungs 2 (two) times daily., Disp: 10.2 g, Rfl: 5 .  valsartan-hydrochlorothiazide (DIOVAN-HCT) 320-12.5 MG tablet, Take 1 tablet by mouth daily., Disp: 90 tablet, Rfl: 2 .  vitamin C (ASCORBIC ACID) 500 MG tablet, Take 500 mg by mouth daily., Disp: , Rfl:  No current facility-administered medications for this visit.  Facility-Administered Medications Ordered in Other Visits:  .  epoetin alfa-epbx (RETACRIT) injection 15,000 Units, 15,000 Units, Subcutaneous, Q14 Days, Justin Mend, MD   Allergies  Allergen Reactions  . Food Anaphylaxis    Peanuts; Almonds  . Other Shortness Of Breath    Peanuts; Almonds  . Wheat Bran Shortness Of Breath  . Statins Itching and Other (See Comments)    Generalized aches  . Iodine Other (See Comments)  . Shellfish Allergy Other (See Comments)    Mouth gets raw  . Tetracycline Other (See Comments)    Raw mouth  . Tetracyclines & Related Itching  . Contrast Media [Iodinated Diagnostic Agents] Rash    Happened during CT scan over 30 years ago     Review of Systems  Constitutional: Negative.   Respiratory: Positive for shortness of breath.    Cardiovascular: Negative.   Gastrointestinal: Negative.   Neurological: Negative.      There were no vitals filed for this visit. There is no height or weight on file to calculate BMI.   Objective:  Physical Exam      Assessment And Plan:     1. Dyspnea on exertion     Patient was given  opportunity to ask questions. Patient verbalized understanding of the plan and was able to repeat key elements of the plan. All questions were answered to their satisfaction.  Octavio Manns   I, Octavio Manns, have reviewed all documentation for this visit. The documentation on 03/04/21 for the exam, diagnosis, procedures, and orders are all accurate and complete.   IF YOU HAVE BEEN REFERRED TO A SPECIALIST, IT MAY TAKE 1-2 WEEKS TO SCHEDULE/PROCESS THE REFERRAL. IF YOU HAVE NOT HEARD FROM US/SPECIALIST IN TWO WEEKS, PLEASE GIVE [place] A CALL AT [phone].   THE PATIENT IS ENCOURAGED TO PRACTICE SOCIAL DISTANCING DUE TO THE COVID-19 PANDEMIC.

## 2021-03-04 NOTE — Progress Notes (Signed)
Patient is here today for nebulizer teaching. Patient is here with her husband. Patient and husband confirms understanding. Samples of albuterol given.

## 2021-03-04 NOTE — Progress Notes (Signed)
This encounter was created in error - please disregard.

## 2021-03-04 NOTE — Patient Instructions (Signed)
http://www.aaaai.org/conditions-and-treatments/asthma">  Asthma, Adult  Asthma is a long-term (chronic) condition that causes recurrent episodes in which the airways become tight and narrow. The airways are the passages that lead from the nose and mouth down into the lungs. Asthma episodes, also called asthma attacks, can cause coughing, wheezing, shortness of breath, and chest pain. The airways can also fill with mucus. During an attack, it can be difficult to breathe. Asthma attacks can range from minor to life threatening. Asthma cannot be cured, but medicines and lifestyle changes can help control it and treat acute attacks. What are the causes? This condition is believed to be caused by inherited (genetic) and environmental factors, but its exact cause is not known. There are many things that can bring on an asthma attack or make asthma symptoms worse (triggers). Asthma triggers are different for each person. Common triggers include:  Mold.  Dust.  Cigarette smoke.  Cockroaches.  Things that can cause allergy symptoms (allergens), such as animal dander or pollen from trees or grass.  Air pollutants such as household cleaners, wood smoke, smog, or chemical odors.  Cold air, weather changes, and winds (which increase molds and pollen in the air).  Strong emotional expressions such as crying or laughing hard.  Stress.  Certain medicines (such as aspirin) or types of medicines (such as beta-blockers).  Sulfites in foods and drinks. Foods and drinks that may contain sulfites include dried fruit, potato chips, and sparkling grape juice.  Infections or inflammatory conditions such as the flu, a cold, or inflammation of the nasal membranes (rhinitis).  Gastroesophageal reflux disease (GERD).  Exercise or strenuous activity. What are the signs or symptoms? Symptoms of this condition may occur right after asthma is triggered or many hours later. Symptoms include:  Wheezing. This can  sound like whistling when you breathe.  Excessive nighttime or early morning coughing.  Frequent or severe coughing with a common cold.  Chest tightness.  Shortness of breath.  Tiredness (fatigue) with minimal activity. How is this diagnosed? This condition is diagnosed based on:  Your medical history.  A physical exam.  Tests, which may include: ? Lung function studies and pulmonary studies (spirometry). These tests can evaluate the flow of air in your lungs. ? Allergy tests. ? Imaging tests, such as X-rays. How is this treated? There is no cure for this condition, but treatment can help control your symptoms. Treatment for asthma usually involves:  Identifying and avoiding your asthma triggers.  Using medicines to control your symptoms. Generally, two types of medicines are used to treat asthma: ? Controller medicines. These help prevent asthma symptoms from occurring. They are usually taken every day. ? Fast-acting reliever or rescue medicines. These quickly relieve asthma symptoms by widening the narrow and tight airways. They are used as needed and provide short-term relief.  Using supplemental oxygen. This may be needed during a severe episode.  Using other medicines, such as: ? Allergy medicines, such as antihistamines, if your asthma attacks are triggered by allergens. ? Immune medicines (immunomodulators). These are medicines that help control the immune system.  Creating an asthma action plan. An asthma action plan is a written plan for managing and treating your asthma attacks. This plan includes: ? A list of your asthma triggers and how to avoid them. ? Information about when medicines should be taken and when their dosage should be changed. ? Instructions about using a device called a peak flow meter. A peak flow meter measures how well the lungs are working   and the severity of your asthma. It helps you monitor your condition. Follow these instructions at  home: Controlling your home environment Control your home environment in the following ways to help avoid triggers and prevent asthma attacks:  Change your heating and air conditioning filter regularly.  Limit your use of fireplaces and wood stoves.  Get rid of pests (such as roaches and mice) and their droppings.  Throw away plants if you see mold on them.  Clean floors and dust surfaces regularly. Use unscented cleaning products.  Try to have someone else vacuum for you regularly. Stay out of rooms while they are being vacuumed and for a short while afterward. If you vacuum, use a dust mask from a hardware store, a double-layered or microfilter vacuum cleaner bag, or a vacuum cleaner with a HEPA filter.  Replace carpet with wood, tile, or vinyl flooring. Carpet can trap dander and dust.  Use allergy-proof pillows, mattress covers, and box spring covers.  Keep your bedroom a trigger-free room.  Avoid pets and keep windows closed when allergens are in the air.  Wash beddings every week in hot water and dry them in a dryer.  Use blankets that are made of polyester or cotton.  Clean bathrooms and kitchens with bleach. If possible, have someone repaint the walls in these rooms with mold-resistant paint. Stay out of the rooms that are being cleaned and painted.  Wash your hands often with soap and water. If soap and water are not available, use hand sanitizer.  Do not allow anyone to smoke in your home. General instructions  Take over-the-counter and prescription medicines only as told by your health care provider. ? Speak with your health care provider if you have questions about how or when to take the medicines. ? Make note if you are requiring more frequent dosages.  Do not use any products that contain nicotine or tobacco, such as cigarettes and e-cigarettes. If you need help quitting, ask your health care provider. Also, avoid being exposed to secondhand smoke.  Use a peak  flow meter as told by your health care provider. Record and keep track of the readings.  Understand and use the asthma action plan to help minimize, or stop an asthma attack, without needing to seek medical care.  Make sure you stay up to date on your yearly vaccinations as told by your health care provider. This may include vaccines for the flu and pneumonia.  Avoid outdoor activities when allergen counts are high and when air quality is low.  Wear a ski mask that covers your nose and mouth during outdoor winter activities. Exercise indoors on cold days if you can.  Warm up before exercising, and take time for a cool-down period after exercise.  Keep all follow-up visits as told by your health care provider. This is important. Where to find more information  For information about asthma, turn to the Centers for Disease Control and Prevention at http://www.clark.net/  For air quality information, turn to [employer] at https://www.miller-reyes.info/ Contact a health care provider if:  You have wheezing, shortness of breath, or a cough even while you are taking medicine to prevent attacks.  The mucus you cough up (sputum) is thicker than usual.  Your sputum changes from clear or white to yellow, green, gray, or bloody.  Your medicines are causing side effects, such as a rash, itching, swelling, or trouble breathing.  You need to use a reliever medicine more than 2-3 times a week.  Your peak  flow reading is still at 50-79% of your personal best after following your action plan for 1 hour.  You have a fever. Get help right away if:  You are getting worse and do not respond to treatment during an asthma attack.  You are short of breath when at rest or when doing very little physical activity.  You have difficulty eating, drinking, or talking.  You have chest pain or tightness.  You develop a fast heartbeat or palpitations.  You have a bluish color to your lips or fingernails.  You are  light-headed or dizzy, or you faint.  Your peak flow reading is less than 50% of your personal best.  You feel too tired to breathe normally. Summary  Asthma is a long-term (chronic) condition that causes recurrent episodes in which the airways become tight and narrow. These episodes can cause coughing, wheezing, shortness of breath, and chest pain.  Asthma cannot be cured, but medicines and lifestyle changes can help control it and treat acute attacks.  Make sure you understand how to avoid triggers and how and when to use your medicines.  Asthma attacks can range from minor to life threatening. Get help right away if you have an asthma attack and do not respond to treatment with your usual rescue medicines. This information is not intended to replace advice given to you by your health care provider. Make sure you discuss any questions you have with your health care provider. Document Revised: 07/10/2020 Document Reviewed: 02/12/2020 Elsevier Patient Education  2021 [employer].

## 2021-03-04 NOTE — Progress Notes (Signed)
Pt here for retacrit.  HGB 7.7 via hemocue.  Pt with shortness of breath, no other complaints.  Dr Johnney Ou notified.  Orders received to increase retacrit to 30000 units today.

## 2021-03-05 ENCOUNTER — Ambulatory Visit: Payer: Medicare Other | Admitting: Podiatry

## 2021-03-05 NOTE — Progress Notes (Signed)
Patient was not seen. Came in for a nurse visit.

## 2021-03-08 ENCOUNTER — Ambulatory Visit (INDEPENDENT_AMBULATORY_CARE_PROVIDER_SITE_OTHER): Payer: Medicare Other | Admitting: Podiatry

## 2021-03-08 ENCOUNTER — Other Ambulatory Visit: Payer: Self-pay

## 2021-03-08 ENCOUNTER — Encounter: Payer: Self-pay | Admitting: Podiatry

## 2021-03-08 DIAGNOSIS — M2010 Hallux valgus (acquired), unspecified foot: Secondary | ICD-10-CM

## 2021-03-08 DIAGNOSIS — E119 Type 2 diabetes mellitus without complications: Secondary | ICD-10-CM

## 2021-03-08 DIAGNOSIS — E11319 Type 2 diabetes mellitus with unspecified diabetic retinopathy without macular edema: Secondary | ICD-10-CM | POA: Insufficient documentation

## 2021-03-08 DIAGNOSIS — N189 Chronic kidney disease, unspecified: Secondary | ICD-10-CM | POA: Insufficient documentation

## 2021-03-08 DIAGNOSIS — B351 Tinea unguium: Secondary | ICD-10-CM

## 2021-03-08 DIAGNOSIS — M79674 Pain in right toe(s): Secondary | ICD-10-CM

## 2021-03-08 DIAGNOSIS — M79675 Pain in left toe(s): Secondary | ICD-10-CM | POA: Diagnosis not present

## 2021-03-08 DIAGNOSIS — N185 Chronic kidney disease, stage 5: Secondary | ICD-10-CM | POA: Insufficient documentation

## 2021-03-08 DIAGNOSIS — E7800 Pure hypercholesterolemia, unspecified: Secondary | ICD-10-CM | POA: Insufficient documentation

## 2021-03-08 DIAGNOSIS — E1165 Type 2 diabetes mellitus with hyperglycemia: Secondary | ICD-10-CM | POA: Insufficient documentation

## 2021-03-08 NOTE — Progress Notes (Signed)
This patient returns to my office for at risk foot care.  This patient requires this care by a professional since this patient will be at risk due to having type 2 diabetes.  This patient is unable to cut nails herself since the patient cannot reach her nails.These nails are painful walking and wearing shoes.  This patient presents for at risk foot care today.  General Appearance  Alert, conversant and in no acute stress.  Vascular  Dorsalis pedis and posterior tibial  pulses are palpable  bilaterally.  Capillary return is within normal limits  bilaterally. Temperature is within normal limits  bilaterally.  Neurologic  Senn-Weinstein monofilament wire test within normal limits  bilaterally. Muscle power within normal limits bilaterally.  Nails Thick disfigured discolored nails with subungual debris  from hallux to fifth toes bilaterally. No evidence of bacterial infection or drainage bilaterally.  Orthopedic  No limitations of motion  feet .  No crepitus or effusions noted.  HAV  B/L.  Hammer toes second  B/L.  Skin  normotropic skin with no porokeratosis noted bilaterally.  No signs of infections or ulcers noted.     Onychomycosis  Pain in right toes  Pain in left toes  Consent was obtained for treatment procedures.   Mechanical debridement of nails 1-5  bilaterally performed with a nail nipper.  Filed with dremel without incident.    Return office visit    10 weeks                Told patient to return for periodic foot care and evaluation due to potential at risk complications.   Gardiner Barefoot DPM

## 2021-03-09 ENCOUNTER — Other Ambulatory Visit: Payer: Self-pay

## 2021-03-09 ENCOUNTER — Inpatient Hospital Stay (HOSPITAL_COMMUNITY)
Admission: EM | Admit: 2021-03-09 | Discharge: 2021-03-24 | DRG: 252 | Disposition: A | Discharge status: Home Health | Payer: Medicare Other | Attending: Internal Medicine | Admitting: Internal Medicine

## 2021-03-09 ENCOUNTER — Emergency Department (HOSPITAL_COMMUNITY): Payer: Medicare Other

## 2021-03-09 ENCOUNTER — Encounter (HOSPITAL_COMMUNITY): Payer: Self-pay | Admitting: Emergency Medicine

## 2021-03-09 ENCOUNTER — Ambulatory Visit (HOSPITAL_COMMUNITY)
Admission: RE | Admit: 2021-03-09 | Discharge: 2021-03-09 | Disposition: A | Discharge status: Home / Self Care | Payer: Medicare Other | Source: Ambulatory Visit | Attending: Internal Medicine | Admitting: Internal Medicine

## 2021-03-09 DIAGNOSIS — J690 Pneumonitis due to inhalation of food and vomit: Secondary | ICD-10-CM | POA: Diagnosis not present

## 2021-03-09 DIAGNOSIS — Z794 Long term (current) use of insulin: Secondary | ICD-10-CM

## 2021-03-09 DIAGNOSIS — E7800 Pure hypercholesterolemia, unspecified: Secondary | ICD-10-CM | POA: Diagnosis present

## 2021-03-09 DIAGNOSIS — E1121 Type 2 diabetes mellitus with diabetic nephropathy: Secondary | ICD-10-CM | POA: Diagnosis present

## 2021-03-09 DIAGNOSIS — I5033 Acute on chronic diastolic (congestive) heart failure: Secondary | ICD-10-CM | POA: Diagnosis present

## 2021-03-09 DIAGNOSIS — Z833 Family history of diabetes mellitus: Secondary | ICD-10-CM

## 2021-03-09 DIAGNOSIS — J189 Pneumonia, unspecified organism: Secondary | ICD-10-CM | POA: Diagnosis not present

## 2021-03-09 DIAGNOSIS — I9589 Other hypotension: Secondary | ICD-10-CM | POA: Diagnosis not present

## 2021-03-09 DIAGNOSIS — I132 Hypertensive heart and chronic kidney disease with heart failure and with stage 5 chronic kidney disease, or end stage renal disease: Secondary | ICD-10-CM | POA: Diagnosis not present

## 2021-03-09 DIAGNOSIS — Z538 Procedure and treatment not carried out for other reasons: Secondary | ICD-10-CM | POA: Diagnosis not present

## 2021-03-09 DIAGNOSIS — I471 Supraventricular tachycardia: Secondary | ICD-10-CM | POA: Diagnosis not present

## 2021-03-09 DIAGNOSIS — Z7682 Awaiting organ transplant status: Secondary | ICD-10-CM

## 2021-03-09 DIAGNOSIS — Z91013 Allergy to seafood: Secondary | ICD-10-CM

## 2021-03-09 DIAGNOSIS — N19 Unspecified kidney failure: Secondary | ICD-10-CM | POA: Diagnosis not present

## 2021-03-09 DIAGNOSIS — N186 End stage renal disease: Secondary | ICD-10-CM | POA: Diagnosis present

## 2021-03-09 DIAGNOSIS — Z992 Dependence on renal dialysis: Secondary | ICD-10-CM | POA: Diagnosis not present

## 2021-03-09 DIAGNOSIS — T17908A Unspecified foreign body in respiratory tract, part unspecified causing other injury, initial encounter: Secondary | ICD-10-CM

## 2021-03-09 DIAGNOSIS — D631 Anemia in chronic kidney disease: Secondary | ICD-10-CM | POA: Diagnosis present

## 2021-03-09 DIAGNOSIS — N179 Acute kidney failure, unspecified: Secondary | ICD-10-CM | POA: Diagnosis present

## 2021-03-09 DIAGNOSIS — I517 Cardiomegaly: Secondary | ICD-10-CM | POA: Diagnosis not present

## 2021-03-09 DIAGNOSIS — N189 Chronic kidney disease, unspecified: Secondary | ICD-10-CM | POA: Diagnosis present

## 2021-03-09 DIAGNOSIS — N2581 Secondary hyperparathyroidism of renal origin: Secondary | ICD-10-CM | POA: Diagnosis present

## 2021-03-09 DIAGNOSIS — K219 Gastro-esophageal reflux disease without esophagitis: Secondary | ICD-10-CM | POA: Diagnosis present

## 2021-03-09 DIAGNOSIS — Z20822 Contact with and (suspected) exposure to covid-19: Secondary | ICD-10-CM | POA: Diagnosis present

## 2021-03-09 DIAGNOSIS — J45909 Unspecified asthma, uncomplicated: Secondary | ICD-10-CM | POA: Diagnosis present

## 2021-03-09 DIAGNOSIS — R0602 Shortness of breath: Secondary | ICD-10-CM | POA: Diagnosis not present

## 2021-03-09 DIAGNOSIS — R001 Bradycardia, unspecified: Secondary | ICD-10-CM | POA: Diagnosis not present

## 2021-03-09 DIAGNOSIS — E872 Acidosis: Secondary | ICD-10-CM | POA: Diagnosis present

## 2021-03-09 DIAGNOSIS — N1832 Chronic kidney disease, stage 3b: Secondary | ICD-10-CM

## 2021-03-09 DIAGNOSIS — N185 Chronic kidney disease, stage 5: Secondary | ICD-10-CM | POA: Diagnosis not present

## 2021-03-09 DIAGNOSIS — J90 Pleural effusion, not elsewhere classified: Secondary | ICD-10-CM | POA: Diagnosis not present

## 2021-03-09 DIAGNOSIS — I5031 Acute diastolic (congestive) heart failure: Secondary | ICD-10-CM | POA: Diagnosis not present

## 2021-03-09 DIAGNOSIS — Z23 Encounter for immunization: Secondary | ICD-10-CM

## 2021-03-09 DIAGNOSIS — D649 Anemia, unspecified: Secondary | ICD-10-CM | POA: Diagnosis not present

## 2021-03-09 DIAGNOSIS — E1165 Type 2 diabetes mellitus with hyperglycemia: Secondary | ICD-10-CM

## 2021-03-09 DIAGNOSIS — Z4901 Encounter for fitting and adjustment of extracorporeal dialysis catheter: Secondary | ICD-10-CM | POA: Diagnosis not present

## 2021-03-09 DIAGNOSIS — Z7982 Long term (current) use of aspirin: Secondary | ICD-10-CM | POA: Diagnosis not present

## 2021-03-09 DIAGNOSIS — E785 Hyperlipidemia, unspecified: Secondary | ICD-10-CM | POA: Diagnosis present

## 2021-03-09 DIAGNOSIS — I2510 Atherosclerotic heart disease of native coronary artery without angina pectoris: Secondary | ICD-10-CM | POA: Diagnosis not present

## 2021-03-09 DIAGNOSIS — Z79899 Other long term (current) drug therapy: Secondary | ICD-10-CM | POA: Diagnosis not present

## 2021-03-09 DIAGNOSIS — I5032 Chronic diastolic (congestive) heart failure: Secondary | ICD-10-CM | POA: Diagnosis present

## 2021-03-09 DIAGNOSIS — J811 Chronic pulmonary edema: Secondary | ICD-10-CM | POA: Diagnosis not present

## 2021-03-09 DIAGNOSIS — R601 Generalized edema: Secondary | ICD-10-CM | POA: Diagnosis not present

## 2021-03-09 DIAGNOSIS — I160 Hypertensive urgency: Secondary | ICD-10-CM | POA: Diagnosis not present

## 2021-03-09 DIAGNOSIS — I959 Hypotension, unspecified: Secondary | ICD-10-CM | POA: Diagnosis not present

## 2021-03-09 DIAGNOSIS — D509 Iron deficiency anemia, unspecified: Secondary | ICD-10-CM | POA: Diagnosis present

## 2021-03-09 DIAGNOSIS — R918 Other nonspecific abnormal finding of lung field: Secondary | ICD-10-CM

## 2021-03-09 DIAGNOSIS — R0600 Dyspnea, unspecified: Secondary | ICD-10-CM | POA: Diagnosis not present

## 2021-03-09 DIAGNOSIS — E1122 Type 2 diabetes mellitus with diabetic chronic kidney disease: Secondary | ICD-10-CM | POA: Diagnosis present

## 2021-03-09 DIAGNOSIS — Z7951 Long term (current) use of inhaled steroids: Secondary | ICD-10-CM

## 2021-03-09 DIAGNOSIS — J810 Acute pulmonary edema: Secondary | ICD-10-CM

## 2021-03-09 DIAGNOSIS — I509 Heart failure, unspecified: Secondary | ICD-10-CM | POA: Diagnosis not present

## 2021-03-09 DIAGNOSIS — E119 Type 2 diabetes mellitus without complications: Secondary | ICD-10-CM

## 2021-03-09 DIAGNOSIS — R0902 Hypoxemia: Secondary | ICD-10-CM | POA: Diagnosis present

## 2021-03-09 DIAGNOSIS — Z91041 Radiographic dye allergy status: Secondary | ICD-10-CM

## 2021-03-09 DIAGNOSIS — E1129 Type 2 diabetes mellitus with other diabetic kidney complication: Secondary | ICD-10-CM | POA: Diagnosis not present

## 2021-03-09 LAB — RESP PANEL BY RT-PCR (FLU A&B, COVID) ARPGX2
Influenza A by PCR: NEGATIVE
Influenza B by PCR: NEGATIVE
SARS Coronavirus 2 by RT PCR: NEGATIVE

## 2021-03-09 LAB — COMPREHENSIVE METABOLIC PANEL
ALT: 49 U/L — ABNORMAL HIGH (ref 0–44)
AST: 27 U/L (ref 15–41)
Albumin: 3 g/dL — ABNORMAL LOW (ref 3.5–5.0)
Alkaline Phosphatase: 96 U/L (ref 38–126)
Anion gap: 8 (ref 5–15)
BUN: 58 mg/dL — ABNORMAL HIGH (ref 8–23)
CO2: 22 mmol/L (ref 22–32)
Calcium: 8.8 mg/dL — ABNORMAL LOW (ref 8.9–10.3)
Chloride: 108 mmol/L (ref 98–111)
Creatinine, Ser: 5.35 mg/dL — ABNORMAL HIGH (ref 0.44–1.00)
GFR, Estimated: 8 mL/min — ABNORMAL LOW (ref >60)
Glucose, Bld: 295 mg/dL — ABNORMAL HIGH (ref 70–99)
Potassium: 4.5 mmol/L (ref 3.5–5.1)
Sodium: 138 mmol/L (ref 135–145)
Total Bilirubin: 0.5 mg/dL (ref 0.3–1.2)
Total Protein: 6.3 g/dL — ABNORMAL LOW (ref 6.5–8.1)

## 2021-03-09 LAB — I-STAT VENOUS BLOOD GAS, ED
Acid-base deficit: 5 mmol/L — ABNORMAL HIGH (ref 0.0–2.0)
Bicarbonate: 20.5 mmol/L (ref 20.0–28.0)
Calcium, Ion: 1.1 mmol/L — ABNORMAL LOW (ref 1.15–1.40)
HCT: 24 % — ABNORMAL LOW (ref 36.0–46.0)
Hemoglobin: 8.2 g/dL — ABNORMAL LOW (ref 12.0–15.0)
O2 Saturation: 88 %
Potassium: 4.4 mmol/L (ref 3.5–5.1)
Sodium: 142 mmol/L (ref 135–145)
TCO2: 22 mmol/L (ref 22–32)
pCO2, Ven: 36.4 mmHg — ABNORMAL LOW (ref 44.0–60.0)
pH, Ven: 7.357 (ref 7.250–7.430)
pO2, Ven: 57 mmHg — ABNORMAL HIGH (ref 32.0–45.0)

## 2021-03-09 LAB — CBC WITH DIFFERENTIAL/PLATELET
Abs Immature Granulocytes: 0.04 10*3/uL (ref 0.00–0.07)
Basophils Absolute: 0 10*3/uL (ref 0.0–0.1)
Basophils Relative: 0 %
Eosinophils Absolute: 0.1 10*3/uL (ref 0.0–0.5)
Eosinophils Relative: 1 %
HCT: 22.8 % — ABNORMAL LOW (ref 36.0–46.0)
Hemoglobin: 7.4 g/dL — ABNORMAL LOW (ref 12.0–15.0)
Immature Granulocytes: 0 %
Lymphocytes Relative: 20 %
Lymphs Abs: 1.9 10*3/uL (ref 0.7–4.0)
MCH: 28 pg (ref 26.0–34.0)
MCHC: 32.5 g/dL (ref 30.0–36.0)
MCV: 86.4 fL (ref 80.0–100.0)
Monocytes Absolute: 1.2 10*3/uL — ABNORMAL HIGH (ref 0.1–1.0)
Monocytes Relative: 12 %
Neutro Abs: 6.4 10*3/uL (ref 1.7–7.7)
Neutrophils Relative %: 67 %
Platelets: 398 10*3/uL (ref 150–400)
RBC: 2.64 MIL/uL — ABNORMAL LOW (ref 3.87–5.11)
RDW: 14.7 % (ref 11.5–15.5)
WBC: 9.7 10*3/uL (ref 4.0–10.5)
nRBC: 0 % (ref 0.0–0.2)

## 2021-03-09 LAB — BRAIN NATRIURETIC PEPTIDE: B Natriuretic Peptide: 818.7 pg/mL — ABNORMAL HIGH (ref 0.0–100.0)

## 2021-03-09 LAB — URINALYSIS, ROUTINE W REFLEX MICROSCOPIC
Bilirubin Urine: NEGATIVE
Glucose, UA: >=500 mg/dL — AB
Ketones, ur: NEGATIVE mg/dL
Leukocytes,Ua: NEGATIVE
Nitrite: NEGATIVE
Protein, ur: >=300 mg/dL — AB
Specific Gravity, Urine: 1.015 (ref 1.005–1.030)
pH: 6 (ref 5.0–8.0)

## 2021-03-09 LAB — TYPE AND SCREEN
ABO/RH(D): O POS
Antibody Screen: NEGATIVE

## 2021-03-09 LAB — TROPONIN I (HIGH SENSITIVITY)
Troponin I (High Sensitivity): 51 ng/L — ABNORMAL HIGH (ref <18)
Troponin I (High Sensitivity): 54 ng/L — ABNORMAL HIGH (ref <18)

## 2021-03-09 IMAGING — DX DG CHEST 2V
1 series · 1 of 1 positions shown · non-contrast
Comparison: [DATE].

CLINICAL DATA: Shortness of breath. Decreased hemoglobin level to
7.2, dyspnea on exertion.

EXAM:
CHEST - 2 VIEW

[w chest lat]
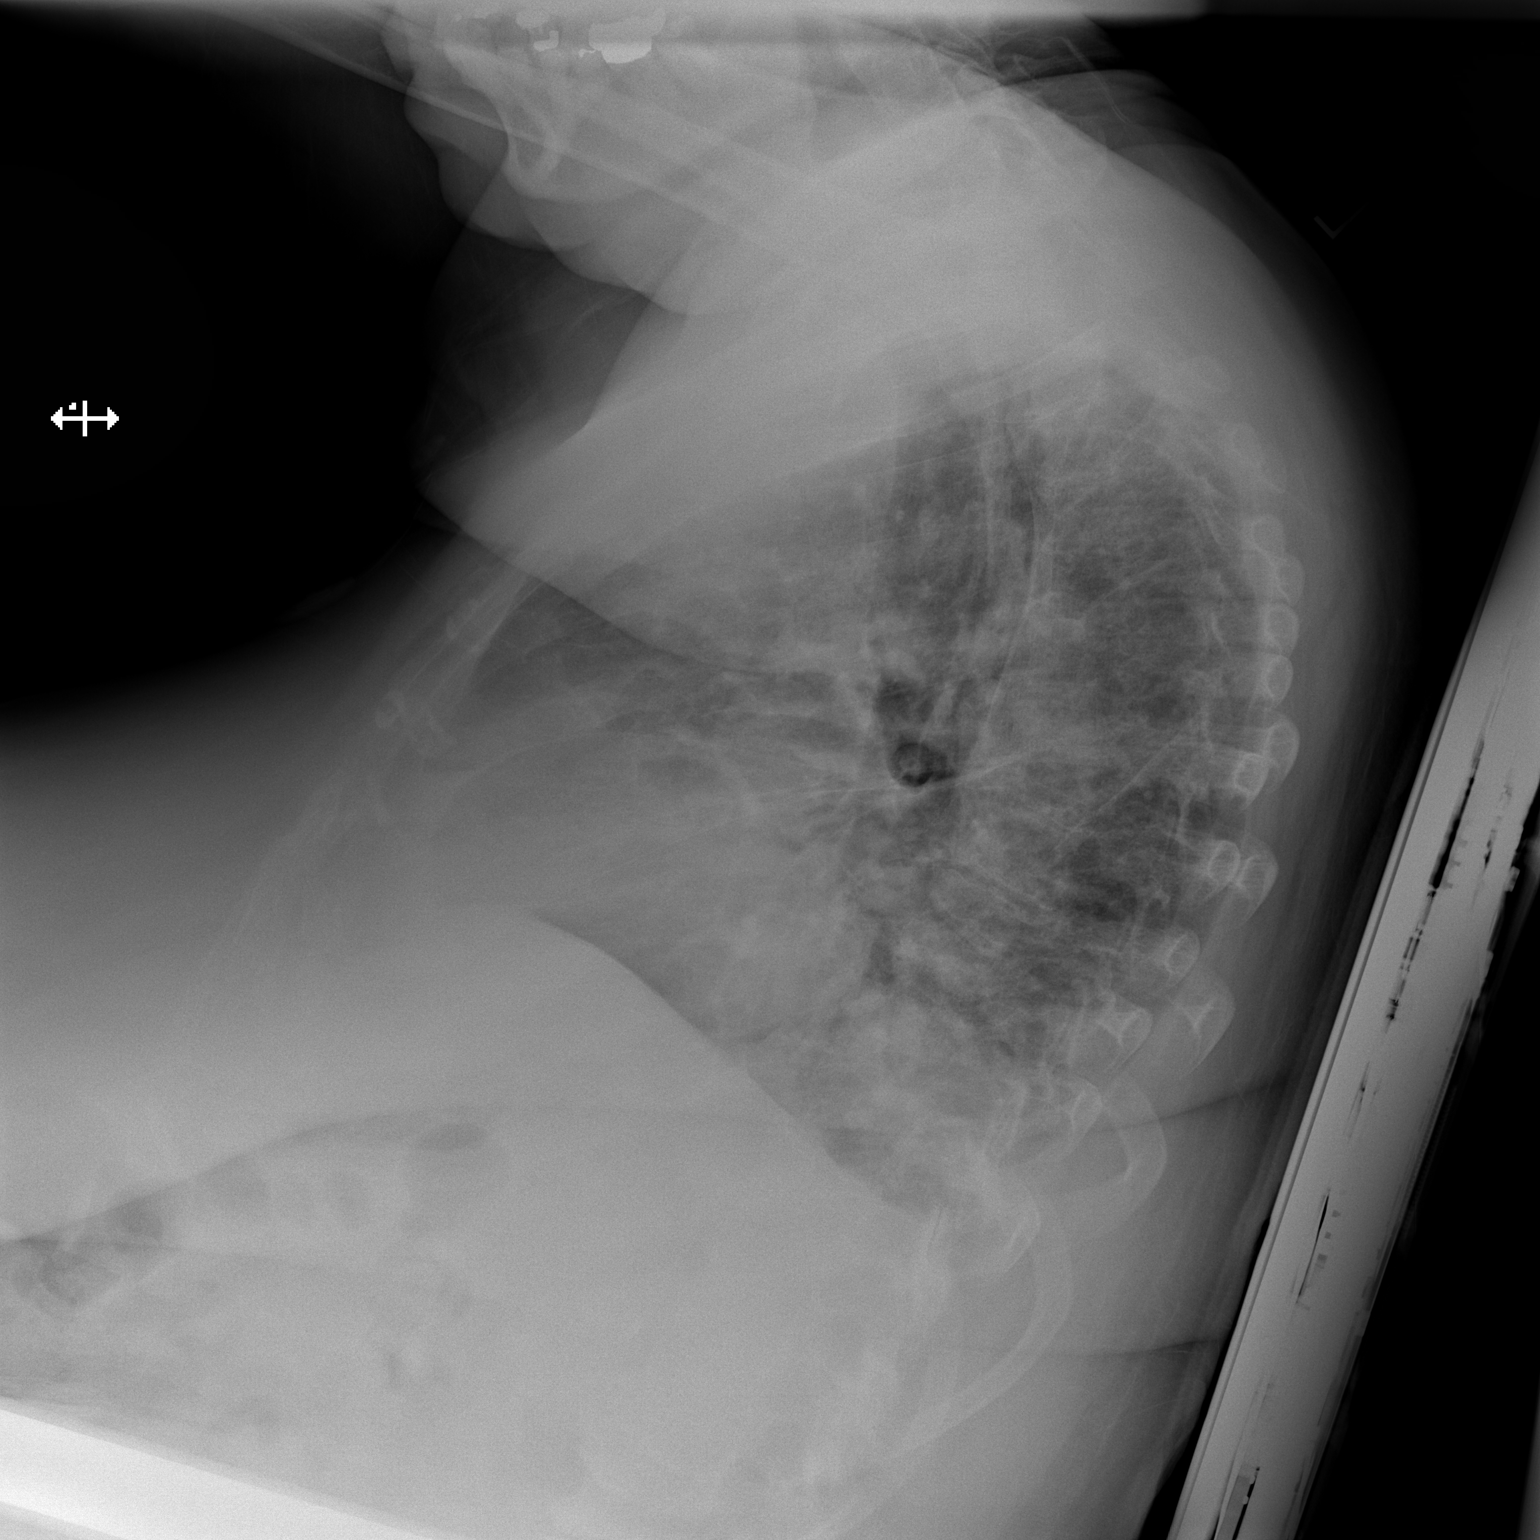

[1 of 1 positions shown; findings below may reference images not displayed]

FINDINGS: Limited evaluation due to patient body habitus.

Trachea is midline. Cardiomediastinal contours with cardiac
enlargement and central pulmonary vascular engorgement. Fullness of
the RIGHT and LEFT hilum may relate to this phenomenon but is not
well assessed.

No lobar level consolidative changes.

Suggestion of LEFT-sided effusion on lateral radiograph and basilar
opacity.

On limited assessment no acute skeletal process.
IMPRESSION: 1. Cardiomegaly and central pulmonary vascular engorgement.
Correlate with any signs of heart failure.
2. Indeterminate fullness of the hila bilaterally. This may relate
to congestive changes at the level of the bilateral hila not well
assessed in AP projection. When the patient is able consider PA and
lateral chest radiograph on follow-up to exclude hilar nodal
enlargement.
3. Potential LEFT effusion and basilar airspace disease. Correlate
with any signs of heart failure or pneumonia.

## 2021-03-09 MED ORDER — ACETAMINOPHEN 500 MG PO TABS
1000.0000 mg | ORAL_TABLET | Freq: Once | ORAL | Status: AC
  Administered 2021-03-09: 1000 mg via ORAL
  Filled 2021-03-09: qty 2

## 2021-03-09 MED ORDER — NITROGLYCERIN 2 % TD OINT
0.5000 [in_us] | TOPICAL_OINTMENT | Freq: Once | TRANSDERMAL | Status: AC
  Administered 2021-03-09: 0.5 [in_us] via TOPICAL
  Filled 2021-03-09: qty 1

## 2021-03-09 MED ORDER — HYDRALAZINE HCL 50 MG PO TABS
50.0000 mg | ORAL_TABLET | Freq: Four times a day (QID) | ORAL | Status: DC | PRN
  Administered 2021-03-10 – 2021-03-12 (×2): 50 mg via ORAL
  Filled 2021-03-09 (×3): qty 1
  Filled 2021-03-09: qty 2

## 2021-03-09 MED ORDER — FUROSEMIDE 10 MG/ML IJ SOLN: 40.0000 mg | Freq: Once | INTRAMUSCULAR | Status: DC

## 2021-03-09 MED ORDER — FUROSEMIDE 10 MG/ML IJ SOLN
80.0000 mg | Freq: Once | INTRAMUSCULAR | Status: AC
  Administered 2021-03-09: 80 mg via INTRAVENOUS
  Filled 2021-03-09: qty 8

## 2021-03-09 NOTE — ED Provider Notes (Signed)
[facility] Provider Note   CSN: [id number] Arrival date & time: 03/09/21  1339     History Chief Complaint  Patient presents with  . Low Hemoglobin   . Leg Swelling    Carolyn Russo is a 70 y.o. female.  The history is provided by the patient and the spouse.  Shortness of Breath Severity:  Severe Onset quality:  Gradual Timing:  Constant Progression:  Worsening Chronicity:  New Context: activity   Context comment:  Exertiona Relieved by:  Nothing Worsened by:  Exertion Ineffective treatments:  Diuretics (albuterol ) Associated symptoms: PND   Associated symptoms: no abdominal pain, no chest pain, no diaphoresis, no fever, no headaches, no hemoptysis, no neck pain, no rash, no sore throat, no sputum production, no syncope and no vomiting   Risk factors: no recent surgery   Patient is a pleasant 51 YOF with a history of chronic kidney disease and dyspnea who presents with 6-8 weeks of leg swelling and shortness of breath on exertion that has progressed markedly in the past 2 weeks.  Patient has seen her PMD and who has started albuterol treatments.  She is also seeing renal who ordered IV iron infusions for patient's worsening anemia but patient has not started these to date.  Along with DOE patient reports PND and orthopnea.       Past Medical History:  Diagnosis Date  . Allergy   . Anterior chest wall pain   . Appendicitis 1965  . Asthma   . Body mass index 37.0-37.9, adult   . Breast pain   . Cataract    both eyes  . Chronic renal disease, stage II   . Dehydration 2014  . Deviated septum 1971  . Diabetes mellitus   . Dyspnea 2014  . Extrinsic asthma    WITH ASTHMA ATTACK  . Fibroid 1980  . GERD (gastroesophageal reflux disease)   . Heart murmur   . Hx gestational diabetes   . Hyperlipidemia   . Hypertension 2014  . Inguinal hernia 1959  . Malaise and fatigue 2014  . Nephropathy   . Non-IgE mediated allergic asthma  2014  . Obesity   . Pelvic pain   . Pregnancy, high-risk 1985  . Tonsillitis 1968  . Uterine fibroid 1980    Patient Active Problem List   Diagnosis Date Noted  . Chronic kidney disease 03/08/2021  . Diabetic retinopathy ([place]) 03/08/2021  . Hyperglycemia due to type 2 diabetes mellitus ([place]) 03/08/2021  . Pure hypercholesterolemia 03/08/2021  . Anemia in chronic kidney disease 09/24/2020  . Uncontrolled type 2 diabetes mellitus with hyperglycemia ([place]) 03/19/2019  . Osteopenia 09/04/2018  . Migraine 09/04/2018  . Asthma 09/04/2018  . Pseudophakia of both eyes 07/17/2018  . Pseudophakia of left eye 07/03/2018  . Open angle with borderline findings and high glaucoma risk in left eye 07/03/2018  . Age-related nuclear cataract of right eye 07/03/2018  . CAD (coronary artery disease), native coronary artery 04/27/2017  . Abnormal nuclear stress test 04/04/2017  . Dyspnea on exertion 03/09/2017  . Appendicitis   . Age-related hypermature cataract of both eyes 12/20/2016  . Uterine leiomyoma 11/27/2012  . Dyslipidemia (high LDL; low HDL) 11/25/2011  . Obesity (BMI 30-39.9) 11/25/2011  . Hypertensive disorder 11/21/2011  . Type 2 diabetes mellitus ([place]) 11/21/2011  . Abnormal EKG 11/21/2011    Past Surgical History:  Procedure Laterality Date  . APPENDECTOMY  1959  . CESAREAN SECTION  1985  .  COLONOSCOPY    . EYE SURGERY     bilateral cataract   . HERNIA REPAIR  1959  . LEFT HEART CATH AND CORONARY ANGIOGRAPHY N/A 04/04/2017   Procedure: Left Heart Cath and Coronary Angiography;  Surgeon: Belva Crome, MD;  Location: [facility];  Service: Cardiovascular;  Laterality: N/A;  . [place], 2004, 2007  . RHINOPLASTY  1971  . ROTATOR CUFF REPAIR  2003  . SURGICAL REPAIR OF HEMORRHAGE  2015  . TONSILLECTOMY  1968     OB History    Gravida  2   Para  1   Term      Preterm      AB      Living  1     SAB      IAB      Ectopic      Multiple       Live Births              Family History  Problem Relation Age of Onset  . Cancer Mother        abdominal melamona  . Psoriasis Mother   . Alzheimer's disease Father   . Cancer Cousin        colon   . Diabetes Cousin   . Diabetes Maternal Aunt   . Colon cancer Neg Hx   . Colon polyps Neg Hx   . Esophageal cancer Neg Hx   . Rectal cancer Neg Hx   . Stomach cancer Neg Hx   . Breast cancer Neg Hx     Social History   Tobacco Use  . Smoking status: Never Smoker  . Smokeless tobacco: Never Used  Vaping Use  . Vaping Use: Never used  Substance Use Topics  . Alcohol use: No  . Drug use: No    Home Medications Prior to Admission medications   Medication Sig Start Date End Date Taking? Authorizing Provider  albuterol (PROVENTIL) (2.5 MG/3ML) 0.083% nebulizer solution Take 3 mLs (2.5 mg total) by nebulization every 4 (four) hours as needed for wheezing or shortness of breath. 03/04/21 03/04/22  Bary Castilla, NP  albuterol (VENTOLIN HFA) 108 (90 Base) MCG/ACT inhaler Inhale 2 puffs into the lungs every 4 (four) hours as needed for wheezing or shortness of breath. 03/01/21   [name], MD  aspirin 81 MG tablet Take 81 mg by mouth daily.    [provider]  b complex vitamins capsule Take 1 capsule by mouth daily.    [provider]  BESIVANCE 0.6 % SUSP INSTILL ONE GTT IN RIGHT EYE QID FOR 2 DAYS AFTER EACH MONTHLY EYE INJECTION 10/26/18   [provider]  calcitRIOL (ROCALTROL) 0.25 MCG capsule Take 0.25 mcg by mouth 3 (three) times a week. 06/24/20   [provider]  Cholecalciferol (VITAMIN D3 PO) Take 2,000 Int'l Units by mouth daily.    [provider]  Continuous Blood Gluc Receiver (DEXCOM G6 RECEIVER) DEVI Use as directed to check blood sugars 3 times per day dx: e11.65 04/14/20   [name], MD  Continuous Blood Gluc Sensor (DEXCOM G6 SENSOR) MISC Use as directed to check blood sugars 3 times per day dx: e11.65 04/14/20    [name], MD  Continuous Blood Gluc Transmit (DEXCOM G6 TRANSMITTER) MISC Use as directed to check blood sugars 3 times per day dx: e11.65 04/14/20   [name], MD  [facility] CINNAMON PO Take 250 mg by mouth 2 (two) times daily.  [provider]  fluticasone (FLONASE) 50 MCG/ACT nasal spray Place 2 sprays into the nose daily as needed for allergies.     [provider]  furosemide (LASIX) 40 MG tablet Take 1/2 to 1 tablet by mouth daily    [provider]  insulin aspart protamine - aspart (NOVOLOG MIX 70/30 FLEXPEN) (70-30) 100 UNIT/ML FlexPen Inject 0.3 mLs (30 Units total) into the skin 2 (two) times daily with a meal. Patient taking differently: Inject into the skin 2 (two) times daily with a meal. 35 units in the morning and 25 units at night 08/12/20   [name], MD  lactobacillus acidophilus (BACID) TABS Take 1 tablet by mouth daily. PROBIOTIC    [provider]  magnesium oxide (MAG-OX) 400 (241.3 Mg) MG tablet Take 1 tablet by mouth daily.    [provider]  milk thistle 175 MG tablet Take 525 mg by mouth daily. Takes 3 daily    [provider]  mometasone-formoterol (DULERA) 200-5 MCG/ACT AERO Inhale 2 puffs into the lungs 2 (two) times daily. 02/25/21   [name], MD  OneTouch Delica Lancets 68T MISC Use as directed to check blood sugars before breakfast and dinner dx: e11.65 04/22/20   [name], MD  [facility] VERIO test strip CHECK BLOOD SUGAR TWICE DAILY AS DIRECTED 02/09/21   [name], MD  polyethylene glycol (MIRALAX / GLYCOLAX) 17 g packet Take 17 g by mouth daily. 03/20/20   [name], MD  Polyvinyl Alcohol-Povidone (REFRESH OP) Apply to eye.    [provider]  rosuvastatin (CRESTOR) 20 MG tablet Take 1 tablet (20 mg total) by mouth daily. 11/19/20   Belva Crome, MD  SYMBICORT 160-4.5 MCG/ACT inhaler Inhale 2 puffs into the lungs 2 (two) times daily. 02/26/21   [name], MD   triamcinolone (KENALOG) 0.147 MG/GM topical spray 1 application    [provider]  valsartan-hydrochlorothiazide (DIOVAN-HCT) 320-12.5 MG tablet Take 1 tablet by mouth daily. 12/29/20   Belva Crome, MD  vitamin C (ASCORBIC ACID) 500 MG tablet Take 500 mg by mouth daily.    [provider]    Allergies    Food, Other, Wheat bran, Statins, Iodine, Shellfish allergy, Sitagliptin, Tetracycline, Tetracyclines & related, and Contrast media [iodinated diagnostic agents]  Review of Systems   Review of Systems  Constitutional: Negative for diaphoresis and fever.  HENT: Negative for sore throat.   Eyes: Negative for redness.  Respiratory: Positive for shortness of breath. Negative for hemoptysis and sputum production.   Cardiovascular: Positive for leg swelling and PND. Negative for chest pain and syncope.  Gastrointestinal: Negative for abdominal pain and vomiting.  Genitourinary: Negative for dysuria.  Musculoskeletal: Negative for neck pain.  Skin: Negative for rash.  Neurological: Negative for headaches.  Psychiatric/Behavioral: Negative for agitation.  All other systems reviewed and are negative.   Physical Exam Updated Vital Signs BP (!) 170/106 (BP Location: Left Arm)   Pulse 83   Temp 97.6 F (36.4 C) (Oral)   Resp 16   Ht 5\' 3"  (1.6 m)   Wt 99.8 kg   SpO2 98%   BMI 38.97 kg/m   Physical Exam Vitals and nursing note reviewed.  Constitutional:      Appearance: Normal appearance. She is not diaphoretic.  HENT:     Head: Normocephalic and atraumatic.     Nose: Nose normal.  Eyes:     Conjunctiva/sclera: Conjunctivae normal.     Pupils: Pupils are equal, round, and  reactive to light.  Cardiovascular:     Rate and Rhythm: Normal rate and regular rhythm.     Pulses: Normal pulses.     Heart sounds: Normal heart sounds.  Pulmonary:     Effort: No respiratory distress.     Breath sounds: Rales present.  Abdominal:     General: Abdomen is flat. Bowel  sounds are normal.     Palpations: Abdomen is soft.     Tenderness: There is no abdominal tenderness. There is no guarding.  Musculoskeletal:        General: Normal range of motion.     Cervical back: Normal range of motion and neck supple.     Right lower leg: Edema present.     Left lower leg: Edema present.  Skin:    General: Skin is warm and dry.     Capillary Refill: Capillary refill takes less than 2 seconds.     Findings: No erythema or rash.  Neurological:     General: No focal deficit present.     Mental Status: She is alert and oriented to person, place, and time.     Deep Tendon Reflexes: Reflexes normal.  Psychiatric:        Mood and Affect: Mood normal.        Behavior: Behavior normal.        Thought Content: Thought content normal.     ED Results / Procedures / Treatments   Labs (all labs ordered are listed, but only abnormal results are displayed) Results for orders placed or performed during the hospital encounter of 03/09/21  CBC with Differential  Result Value Ref Range   WBC 9.7 4.0 - 10.5 K/uL   RBC 2.64 (L) 3.87 - 5.11 MIL/uL   Hemoglobin 7.4 (L) 12.0 - 15.0 g/dL   HCT 22.8 (L) 36.0 - 46.0 %   MCV 86.4 80.0 - 100.0 fL   MCH 28.0 26.0 - 34.0 pg   MCHC 32.5 30.0 - 36.0 g/dL   RDW 14.7 11.5 - 15.5 %   Platelets 398 150 - 400 K/uL   nRBC 0.0 0.0 - 0.2 %   Neutrophils Relative % 67 %   Neutro Abs 6.4 1.7 - 7.7 K/uL   Lymphocytes Relative 20 %   Lymphs Abs 1.9 0.7 - 4.0 K/uL   Monocytes Relative 12 %   Monocytes Absolute 1.2 (H) 0.1 - 1.0 K/uL   Eosinophils Relative 1 %   Eosinophils Absolute 0.1 0.0 - 0.5 K/uL   Basophils Relative 0 %   Basophils Absolute 0.0 0.0 - 0.1 K/uL   Immature Granulocytes 0 %   Abs Immature Granulocytes 0.04 0.00 - 0.07 K/uL  Comprehensive metabolic panel  Result Value Ref Range   Sodium 138 135 - 145 mmol/L   Potassium 4.5 3.5 - 5.1 mmol/L   Chloride 108 98 - 111 mmol/L   CO2 22 22 - 32 mmol/L   Glucose, Bld 295 (H)  70 - 99 mg/dL   BUN 58 (H) 8 - 23 mg/dL   Creatinine, Ser 5.35 (H) 0.44 - 1.00 mg/dL   Calcium 8.8 (L) 8.9 - 10.3 mg/dL   Total Protein 6.3 (L) 6.5 - 8.1 g/dL   Albumin 3.0 (L) 3.5 - 5.0 g/dL   AST 27 15 - 41 U/L   ALT 49 (H) 0 - 44 U/L   Alkaline Phosphatase 96 38 - 126 U/L   Total Bilirubin 0.5 0.3 - 1.2 mg/dL   GFR, Estimated 8 (L) >60  mL/min   Anion gap 8 5 - 15  Brain natriuretic peptide  Result Value Ref Range   B Natriuretic Peptide 818.7 (H) 0.0 - 100.0 pg/mL  Type and screen [place]  Result Value Ref Range   ABO/RH(D) O POS    Antibody Screen NEG    Sample Expiration      03/12/2021,2359 Performed at [facility] [address], [place], [place] [address]    DG Chest 2 View  Result Date: 03/09/2021 CLINICAL DATA:  Shortness of breath. Decreased hemoglobin level to 7.2, dyspnea on exertion. EXAM: CHEST - 2 VIEW COMPARISON:  Venisa Frampton 8, 2018. FINDINGS: Limited evaluation due to patient body habitus. Trachea is midline. Cardiomediastinal contours with cardiac enlargement and central pulmonary vascular engorgement. Fullness of the RIGHT and LEFT hilum may relate to this phenomenon but is not well assessed. No lobar level consolidative changes. Suggestion of LEFT-sided effusion on lateral radiograph and basilar opacity. On limited assessment no acute skeletal process. IMPRESSION: 1. Cardiomegaly and central pulmonary vascular engorgement. Correlate with any signs of heart failure. 2. Indeterminate fullness of the hila bilaterally. This may relate to congestive changes at the level of the bilateral hila not well assessed in AP projection. When the patient is able consider PA and lateral chest radiograph on follow-up to exclude hilar nodal enlargement. 3. Potential LEFT effusion and basilar airspace disease. Correlate with any signs of heart failure or pneumonia. Electronically Signed   By: Zetta Bills M.D.   On: 03/09/2021 15:48    EKG EKG  Interpretation  Date/Time:  Tuesday Mar 09 2021 14:27:20 EDT Ventricular Rate:  79 PR Interval:  156 QRS Duration: 84 QT Interval:  400 QTC Calculation: 458 R Axis:   -21 Text Interpretation: Normal sinus rhythm Possible Anterior infarct , age undetermined Confirmed by Veatrice Kells [phone]) on 03/09/2021 9:08:56 PM   Radiology DG Chest 2 View  Result Date: 03/09/2021 CLINICAL DATA:  Shortness of breath. Decreased hemoglobin level to 7.2, dyspnea on exertion. EXAM: CHEST - 2 VIEW COMPARISON:  Lafawn Lenoir 8, 2018. FINDINGS: Limited evaluation due to patient body habitus. Trachea is midline. Cardiomediastinal contours with cardiac enlargement and central pulmonary vascular engorgement. Fullness of the RIGHT and LEFT hilum may relate to this phenomenon but is not well assessed. No lobar level consolidative changes. Suggestion of LEFT-sided effusion on lateral radiograph and basilar opacity. On limited assessment no acute skeletal process. IMPRESSION: 1. Cardiomegaly and central pulmonary vascular engorgement. Correlate with any signs of heart failure. 2. Indeterminate fullness of the hila bilaterally. This may relate to congestive changes at the level of the bilateral hila not well assessed in AP projection. When the patient is able consider PA and lateral chest radiograph on follow-up to exclude hilar nodal enlargement. 3. Potential LEFT effusion and basilar airspace disease. Correlate with any signs of heart failure or pneumonia. Electronically Signed   By: Zetta Bills M.D.   On: 03/09/2021 15:48    Procedures Procedures   Medications Ordered in ED Medications  nitroGLYCERIN (NITROGLYN) 2 % ointment 0.5 inch (has no administration in time range)  furosemide (LASIX) injection 80 mg (has no administration in time range)  acetaminophen (TYLENOL) tablet 1,000 mg (has no administration in time range)    ED Course  I have reviewed the triage vital signs and the nursing notes.  Pertinent labs &  imaging results that were available during my care of the patient were reviewed by me and considered in my medical decision making (  see chart for details).    Patient is in acute pulmonary edema with anasarca with associated symptomatic anemia.  Patient will require diuresis and possible transfusion.  Given elevated BP have started nitroglycerin.    Carolyn Russo was evaluated in Emergency Department on 03/09/2021 for the symptoms described in the history of present illness. She was evaluated in the context of the global COVID-19 pandemic, which necessitated consideration that the patient might be at risk for infection with the SARS-CoV-2 virus that causes COVID-19. Institutional protocols and algorithms that pertain to the evaluation of patients at risk for COVID-19 are in a state of rapid change based on information released by regulatory bodies including the CDC and federal and state organizations. These policies and algorithms were followed during the patient's care in the ED.  Final Clinical Impression(s) / ED Diagnoses Final diagnoses:  Acute pulmonary edema ([place])  Anasarca  Symptomatic anemia    Admit to medicine    Yadhira Mckneely, MD 03/09/21 2159

## 2021-03-09 NOTE — ED Triage Notes (Signed)
Pt is here reporting a recent low hgb level of 7.2, dyspnea with exertion, bilateral lower extremity edema. Due for an iron infusion but unable to receive one due to hours of operation. Her husband Dr. [name] is present as historian. Denies chest pain, denies vomiting or diarrhea. Is nauseated.

## 2021-03-09 NOTE — ED Provider Notes (Signed)
Emergency Medicine Provider Triage Evaluation Note  SUSANA DUELL , a 70 y.o. female  was evaluated in triage.  Pt complains of weakness, shortness of breath, lower extremity edema.  Patient has had gradually worsening hemoglobin levels, was scheduled for iron infusion but did not make it in time.  She reports worsening leg swelling over the past week.  She also reports worsened asthma symptoms/shortness of breath.  No fever, cough, vomiting, blood in stool or urine.  Review of Systems  Positive: Shortness of breath, bilateral lower extremity edema, weakness Negative: fever  Physical Exam  BP (!) 128/114 (BP Location: Right Arm)   Pulse 81   Temp 98.6 F (37 C)   Resp 16   Ht 5\' 3"  (1.6 m)   Wt 99.8 kg   SpO2 97%   BMI 38.97 kg/m  Gen:   Awake, no distress. Appears pale Resp:  Normal effort, clear lung sounds MSK:   Moves extremities without difficulty  Other:  Bilateral lower extremity edema.  Medical Decision Making  Medically screening exam initiated at 2:35 PM.  Appropriate orders placed.  Verl F Clair was informed that the remainder of the evaluation will be completed by another provider, this initial triage assessment does not replace that evaluation, and the importance of remaining in the ED until their evaluation is complete.  Patient appears pale, concern for anemia.  Labs, ekg, and chest x-ray ordered   Franchot Heidelberg, PA-C 03/09/21 1443    Sherwood Gambler, MD 03/11/21 1511

## 2021-03-09 NOTE — Progress Notes (Signed)
Pt arrived 40 min late for IV feraheme infusion and this is not the first time the patient has arrived late for her appointments in the past. I let the patient's husband know our policy is any patient that arrives more then 30 min late for their appointments will have to be rescheduled, and that we had an opening this Friday at either 0800 or 1300.  Pt's husband stated he thought she was too weak to wait and he wanted her hemoglobin checked.  I let him know we do not have any orders for that today, but only orders to give the iv iron.  He said he was just going to take her to the ER to be checked out.

## 2021-03-10 ENCOUNTER — Encounter (HOSPITAL_COMMUNITY): Payer: Self-pay | Admitting: Family Medicine

## 2021-03-10 ENCOUNTER — Inpatient Hospital Stay (HOSPITAL_COMMUNITY): Payer: Medicare Other

## 2021-03-10 ENCOUNTER — Encounter (INDEPENDENT_AMBULATORY_CARE_PROVIDER_SITE_OTHER): Payer: Medicare Other | Admitting: Ophthalmology

## 2021-03-10 DIAGNOSIS — N185 Chronic kidney disease, stage 5: Secondary | ICD-10-CM | POA: Diagnosis not present

## 2021-03-10 DIAGNOSIS — J810 Acute pulmonary edema: Secondary | ICD-10-CM

## 2021-03-10 DIAGNOSIS — I160 Hypertensive urgency: Secondary | ICD-10-CM | POA: Diagnosis present

## 2021-03-10 DIAGNOSIS — D649 Anemia, unspecified: Secondary | ICD-10-CM | POA: Insufficient documentation

## 2021-03-10 DIAGNOSIS — R918 Other nonspecific abnormal finding of lung field: Secondary | ICD-10-CM | POA: Insufficient documentation

## 2021-03-10 DIAGNOSIS — I5033 Acute on chronic diastolic (congestive) heart failure: Secondary | ICD-10-CM | POA: Diagnosis not present

## 2021-03-10 DIAGNOSIS — D631 Anemia in chronic kidney disease: Secondary | ICD-10-CM | POA: Diagnosis not present

## 2021-03-10 DIAGNOSIS — D638 Anemia in other chronic diseases classified elsewhere: Secondary | ICD-10-CM | POA: Insufficient documentation

## 2021-03-10 LAB — CBG MONITORING, ED
Glucose-Capillary: 114 mg/dL — ABNORMAL HIGH (ref 70–99)
Glucose-Capillary: 133 mg/dL — ABNORMAL HIGH (ref 70–99)
Glucose-Capillary: 236 mg/dL — ABNORMAL HIGH (ref 70–99)

## 2021-03-10 LAB — GLUCOSE, CAPILLARY
Glucose-Capillary: 155 mg/dL — ABNORMAL HIGH (ref 70–99)
Glucose-Capillary: 144 mg/dL — ABNORMAL HIGH (ref 70–99)
Glucose-Capillary: 100 mg/dL — ABNORMAL HIGH (ref 70–99)

## 2021-03-10 LAB — POCT HEMOGLOBIN-HEMACUE: Hemoglobin: 7.7 g/dL — ABNORMAL LOW (ref 12.0–15.0)

## 2021-03-10 IMAGING — CR DG CHEST 2V
2 series · 2 of 2 positions shown · non-contrast
Comparison: [DATE]

CLINICAL DATA: Hilar enlargement on chest radiograph

EXAM:
CHEST - 2 VIEW

[chest lat]
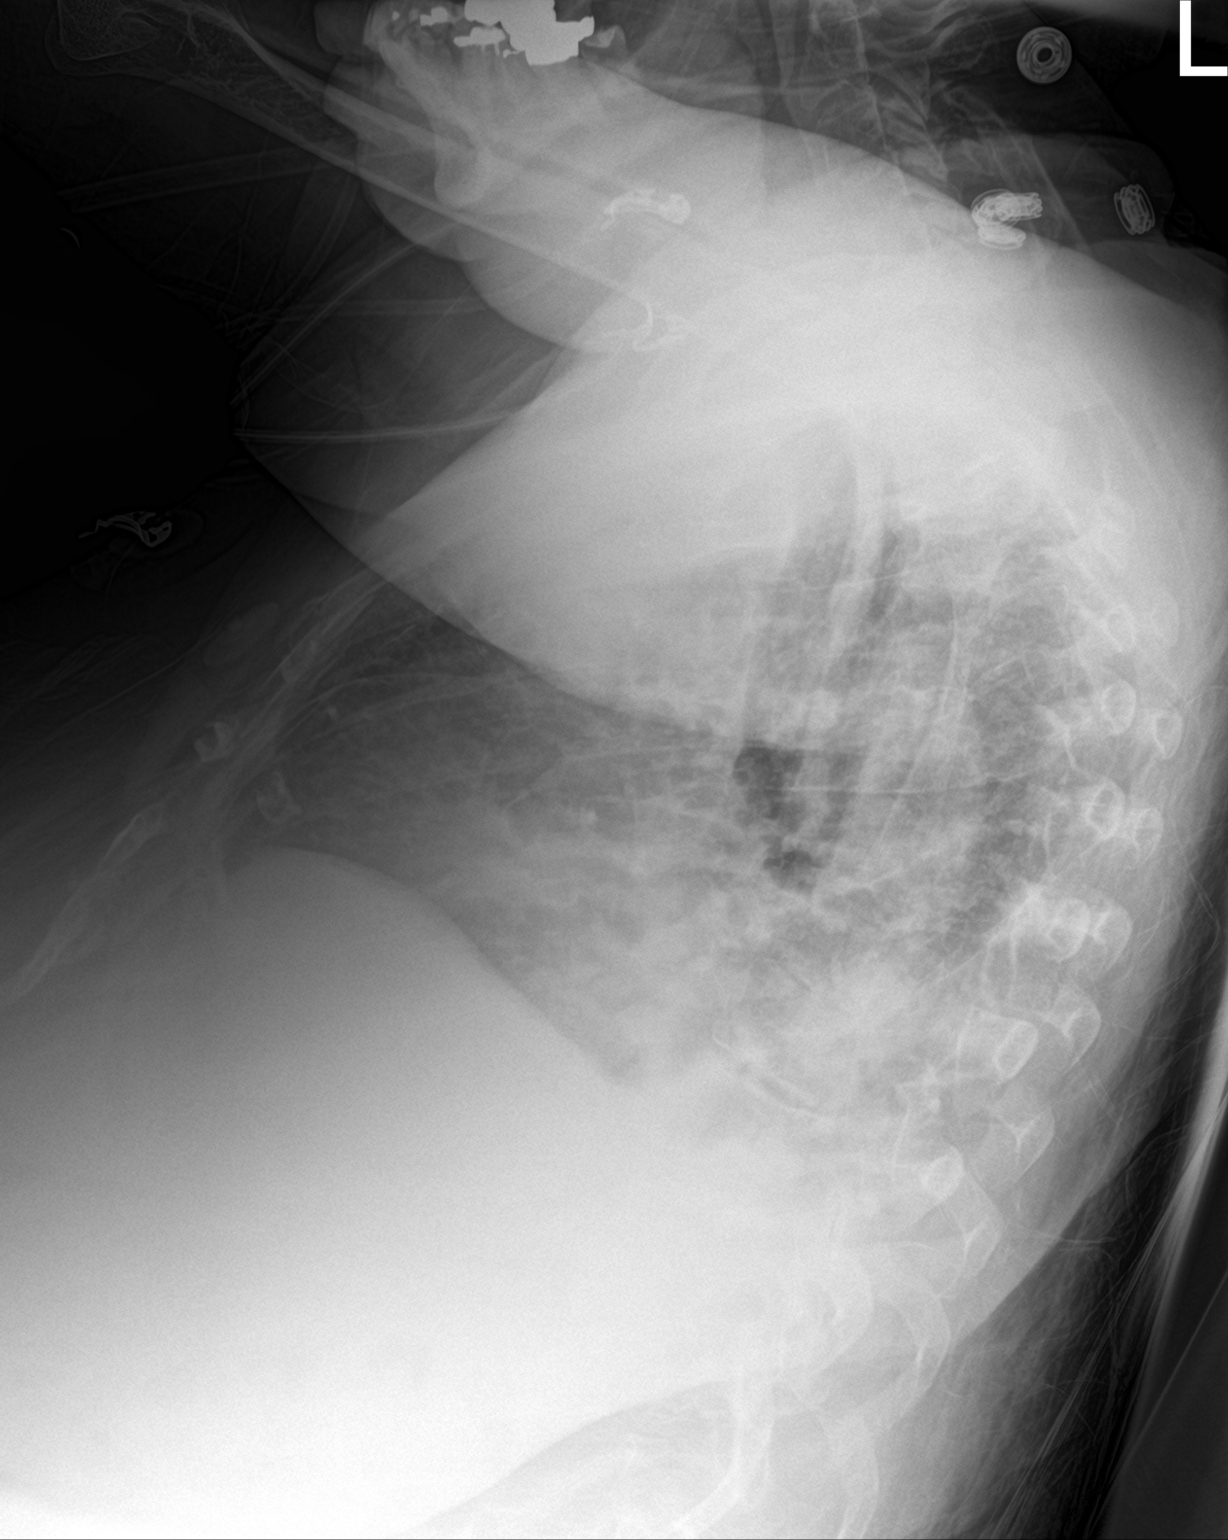

[chest ap]
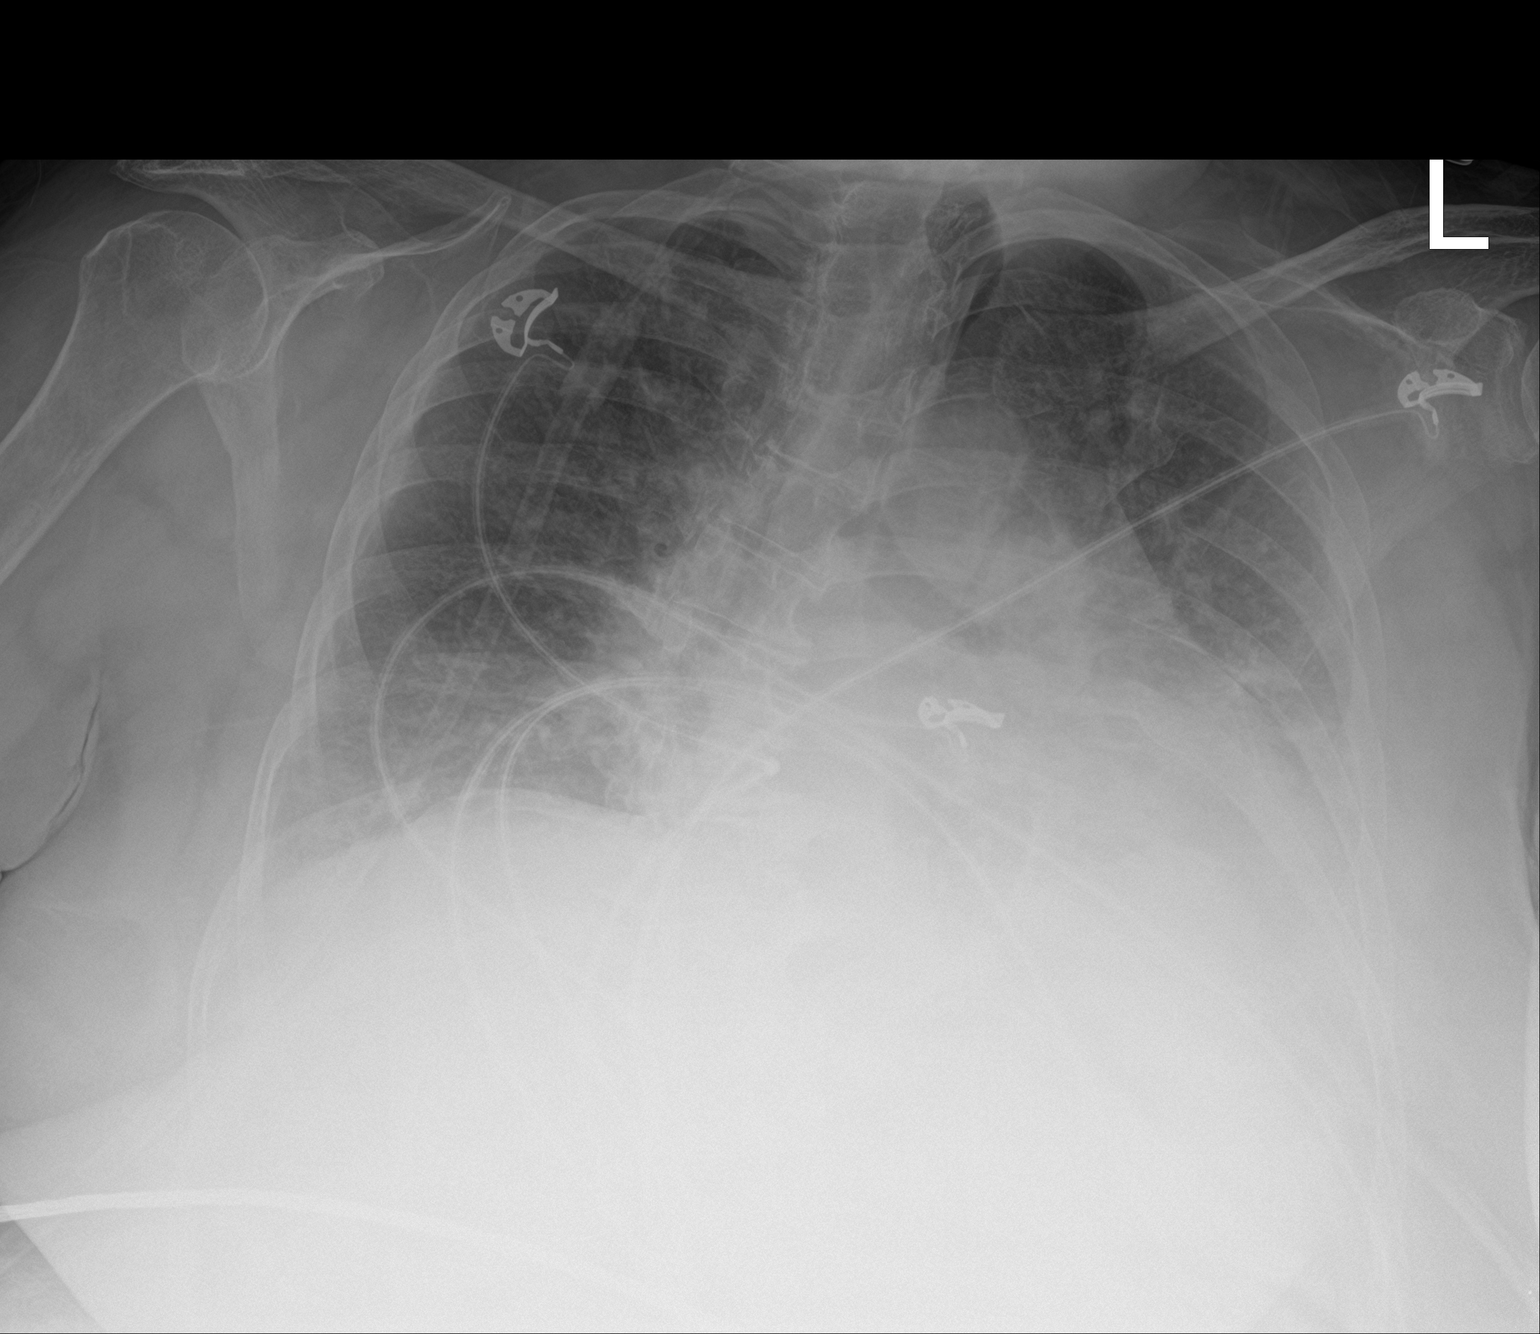

[2 of 2 positions shown; findings below may reference images not displayed]

FINDINGS: The heart is enlarged. Pulmonary vascular congestion present.
Diffuse bilateral airspace opacities, progressed since prior
examination.
IMPRESSION: Radiographic findings most consistent with CHF/fluid volume
overload, which has progressed since the prior exam.

## 2021-03-10 MED ORDER — ACETAMINOPHEN 325 MG PO TABS
650.0000 mg | ORAL_TABLET | ORAL | Status: DC | PRN
  Administered 2021-03-13 – 2021-03-19 (×5): 650 mg via ORAL
  Filled 2021-03-10 (×4): qty 2

## 2021-03-10 MED ORDER — ROSUVASTATIN CALCIUM 5 MG PO TABS
10.0000 mg | ORAL_TABLET | Freq: Every day | ORAL | Status: DC
  Administered 2021-03-10 – 2021-03-24 (×15): 10 mg via ORAL
  Filled 2021-03-10 (×16): qty 2

## 2021-03-10 MED ORDER — ASPIRIN 81 MG PO CHEW
81.0000 mg | CHEWABLE_TABLET | Freq: Every day | ORAL | Status: DC
  Administered 2021-03-10 – 2021-03-24 (×15): 81 mg via ORAL
  Filled 2021-03-10 (×15): qty 1

## 2021-03-10 MED ORDER — MOMETASONE FURO-FORMOTEROL FUM 200-5 MCG/ACT IN AERO
2.0000 | INHALATION_SPRAY | Freq: Two times a day (BID) | RESPIRATORY_TRACT | Status: DC
  Administered 2021-03-11 – 2021-03-24 (×26): 2 via RESPIRATORY_TRACT
  Filled 2021-03-10 (×2): qty 8.8

## 2021-03-10 MED ORDER — FUROSEMIDE 10 MG/ML IJ SOLN
60.0000 mg | Freq: Two times a day (BID) | INTRAMUSCULAR | Status: DC
  Administered 2021-03-10 – 2021-03-12 (×6): 60 mg via INTRAVENOUS
  Filled 2021-03-10 (×6): qty 6

## 2021-03-10 MED ORDER — CALCITRIOL 0.25 MCG PO CAPS
0.2500 ug | ORAL_CAPSULE | ORAL | Status: DC
  Administered 2021-03-10 – 2021-03-24 (×7): 0.25 ug via ORAL
  Filled 2021-03-10 (×7): qty 1

## 2021-03-10 MED ORDER — HEPARIN SODIUM (PORCINE) 5000 UNIT/ML IJ SOLN
5000.0000 [IU] | Freq: Three times a day (TID) | INTRAMUSCULAR | Status: DC
  Administered 2021-03-10 – 2021-03-24 (×42): 5000 [IU] via SUBCUTANEOUS
  Filled 2021-03-10 (×43): qty 1

## 2021-03-10 MED ORDER — INSULIN ASPART 100 UNIT/ML IJ SOLN
0.0000 [IU] | Freq: Three times a day (TID) | INTRAMUSCULAR | Status: DC
Start: 2021-03-10 — End: 2021-03-25
  Administered 2021-03-10 – 2021-03-11 (×3): 2 [IU] via SUBCUTANEOUS
  Administered 2021-03-12: 1 [IU] via SUBCUTANEOUS
  Administered 2021-03-12 – 2021-03-13 (×2): 3 [IU] via SUBCUTANEOUS
  Administered 2021-03-14: 2 [IU] via SUBCUTANEOUS
  Administered 2021-03-15 – 2021-03-16 (×4): 1 [IU] via SUBCUTANEOUS
  Administered 2021-03-17: 2 [IU] via SUBCUTANEOUS
  Administered 2021-03-17 (×2): 3 [IU] via SUBCUTANEOUS
  Administered 2021-03-20: 2 [IU] via SUBCUTANEOUS
  Administered 2021-03-21: 1 [IU] via SUBCUTANEOUS
  Administered 2021-03-21: 3 [IU] via SUBCUTANEOUS
  Administered 2021-03-22: 1 [IU] via SUBCUTANEOUS
  Administered 2021-03-22: 2 [IU] via SUBCUTANEOUS
  Administered 2021-03-23: 1 [IU] via SUBCUTANEOUS
  Administered 2021-03-23 (×2): 3 [IU] via SUBCUTANEOUS
  Administered 2021-03-24: 2 [IU] via SUBCUTANEOUS
  Administered 2021-03-24: 1 [IU] via SUBCUTANEOUS
  Administered 2021-03-24: 2 [IU] via SUBCUTANEOUS

## 2021-03-10 MED ORDER — INSULIN ASPART 100 UNIT/ML IJ SOLN
0.0000 [IU] | Freq: Every day | INTRAMUSCULAR | Status: DC
  Administered 2021-03-10: 2 [IU] via SUBCUTANEOUS
  Administered 2021-03-16: 4 [IU] via SUBCUTANEOUS
  Administered 2021-03-22: 2 [IU] via SUBCUTANEOUS

## 2021-03-10 MED ORDER — ALBUTEROL SULFATE (2.5 MG/3ML) 0.083% IN NEBU
2.5000 mg | INHALATION_SOLUTION | RESPIRATORY_TRACT | Status: DC | PRN
  Administered 2021-03-10: 2.5 mg via RESPIRATORY_TRACT
  Filled 2021-03-10: qty 3

## 2021-03-10 MED ORDER — ONDANSETRON HCL 4 MG/2ML IJ SOLN
4.0000 mg | Freq: Four times a day (QID) | INTRAMUSCULAR | Status: DC | PRN
  Administered 2021-03-15 – 2021-03-23 (×7): 4 mg via INTRAVENOUS
  Filled 2021-03-10 (×8): qty 2

## 2021-03-10 MED ORDER — INSULIN DETEMIR 100 UNIT/ML ~~LOC~~ SOLN
15.0000 [IU] | Freq: Two times a day (BID) | SUBCUTANEOUS | Status: DC
  Administered 2021-03-10: 15 [IU] via SUBCUTANEOUS
  Filled 2021-03-10 (×3): qty 0.15

## 2021-03-10 MED ORDER — SODIUM CHLORIDE 0.9 % IV SOLN: 250.0000 mL | INTRAVENOUS | Status: DC | PRN

## 2021-03-10 MED ORDER — INSULIN DETEMIR 100 UNIT/ML ~~LOC~~ SOLN
15.0000 [IU] | Freq: Two times a day (BID) | SUBCUTANEOUS | Status: DC
  Administered 2021-03-10 – 2021-03-17 (×14): 15 [IU] via SUBCUTANEOUS
  Filled 2021-03-10 (×15): qty 0.15

## 2021-03-10 NOTE — H&P (Signed)
History and Physical    NATAHLIA HOGGARD OZD:[id number] DOB: [birth year] DOA: 03/09/2021  PCP: [name], MD   Patient coming from: Home   Chief Complaint: SOB, leg swelling   HPI: Gayleen WELLS GERDEMAN is a 70 y.o. female with medical history significant for CKD stage IV or V, insulin-dependent diabetes mellitus, CAD, iron deficiency anemia, hypertension, and asthma, now presenting to the emergency department for evaluation of worsening shortness of breath and bilateral lower extremity swelling.  Patient has had slowly progressive increase in her bilateral leg swelling and exertional dyspnea over the past 4 to 6 weeks, and particularly over the past 2 weeks.  She has been using albuterol with increasing frequency at home but continues to worsen despite this.  Lasix was increased approximately 2 weeks ago.  She has not been coughing much and denies fevers, chills, or chest pain.  Denies melena or hematochezia.  ED Course: Upon arrival to the ED, patient is found to be afebrile, saturating upper 90s on 2 L/min of supplemental oxygen, and with blood pressure as high as 210/110.  EKG features sinus rhythm.  Chest x-ray with cardiomegaly, vascular congestion, possible left pleural effusion, and indeterminate bilateral hilar fullness.  Chemistry panel features a creatinine of 5.35 and glucose 295.  CBC notable for hemoglobin of 7.4.  High-sensitivity troponin was 51 then 54.  BNP elevated to 819.  Patient was given 80 mg IV Lasix, nitroglycerin, and acetaminophen in the ED.  Review of Systems:  All other systems reviewed and apart from HPI, are negative.  Past Medical History:  Diagnosis Date  . Allergy   . Anterior chest wall pain   . Appendicitis 1965  . Asthma   . Body mass index 37.0-37.9, adult   . Breast pain   . Cataract    both eyes  . Chronic renal disease, stage II   . Dehydration 2014  . Deviated septum 1971  . Diabetes mellitus   . Dyspnea 2014  . Extrinsic asthma    WITH ASTHMA  ATTACK  . Fibroid 1980  . GERD (gastroesophageal reflux disease)   . Heart murmur   . Hx gestational diabetes   . Hyperlipidemia   . Hypertension 2014  . Inguinal hernia 1959  . Malaise and fatigue 2014  . Nephropathy   . Non-IgE mediated allergic asthma 2014  . Obesity   . Pelvic pain   . Pregnancy, high-risk 1985  . Tonsillitis 1968  . Uterine fibroid 1980    Past Surgical History:  Procedure Laterality Date  . APPENDECTOMY  1959  . CESAREAN SECTION  1985  . COLONOSCOPY    . EYE SURGERY     bilateral cataract   . HERNIA REPAIR  1959  . LEFT HEART CATH AND CORONARY ANGIOGRAPHY N/A 04/04/2017   Procedure: Left Heart Cath and Coronary Angiography;  Surgeon: Belva Crome, MD;  Location: [facility];  Service: Cardiovascular;  Laterality: N/A;  . [place], 2004, 2007  . RHINOPLASTY  1971  . ROTATOR CUFF REPAIR  2003  . SURGICAL REPAIR OF HEMORRHAGE  2015  . TONSILLECTOMY  1968    Social History:   reports that she has never smoked. She has never used smokeless tobacco. She reports that she does not drink alcohol and does not use drugs.  Allergies  Allergen Reactions  . Food Anaphylaxis    Peanuts; Almonds  . Other Shortness Of Breath    Peanuts; Almonds  . Wheat Bran Shortness Of Breath  .  Statins Itching and Other (See Comments)    Generalized aches  . Iodine Other (See Comments)  . Shellfish Allergy Other (See Comments)    Mouth gets raw  . Sitagliptin     Other reaction(s): Unknown  . Tetracycline Other (See Comments)    Raw mouth  . Tetracyclines & Related Itching  . Contrast Media [Iodinated Diagnostic Agents] Rash    Happened during CT scan over 30 years ago    Family History  Problem Relation Age of Onset  . Cancer Mother        abdominal melamona  . Psoriasis Mother   . Alzheimer's disease Father   . Cancer Cousin        colon   . Diabetes Cousin   . Diabetes Maternal Aunt   . Colon cancer Neg Hx   . Colon polyps Neg Hx   .  Esophageal cancer Neg Hx   . Rectal cancer Neg Hx   . Stomach cancer Neg Hx   . Breast cancer Neg Hx      Prior to Admission medications   Medication Sig Start Date End Date Taking? Authorizing Provider  albuterol (PROVENTIL) (2.5 MG/3ML) 0.083% nebulizer solution Take 3 mLs (2.5 mg total) by nebulization every 4 (four) hours as needed for wheezing or shortness of breath. 03/04/21 03/04/22 Yes Ghumman, Ramandeep, NP  albuterol (VENTOLIN HFA) 108 (90 Base) MCG/ACT inhaler Inhale 2 puffs into the lungs every 4 (four) hours as needed for wheezing or shortness of breath. 03/01/21  Yes [name], MD  aspirin 81 MG tablet Take 81 mg by mouth daily.   Yes [provider]  b complex vitamins capsule Take 1 capsule by mouth daily.   Yes [provider]  BESIVANCE 0.6 % SUSP Place 1 drop into both eyes See admin instructions. After  eye injections 10/26/18  Yes [provider]  calcitRIOL (ROCALTROL) 0.25 MCG capsule Take 0.25 mcg by mouth 3 (three) times a week. 06/24/20  Yes [provider]  Caraway Oil-Levomenthol (FDGARD PO) Take 1 tablet by mouth 2 (two) times daily as needed (nausea).   Yes [provider]  Cholecalciferol (VITAMIN D3 PO) Take 2,000 Units by mouth daily.   Yes [provider]  Continuous Blood Gluc Receiver (DEXCOM G6 RECEIVER) DEVI Use as directed to check blood sugars 3 times per day dx: e11.65 04/14/20  Yes [name], MD  Continuous Blood Gluc Sensor (DEXCOM G6 SENSOR) MISC Use as directed to check blood sugars 3 times per day dx: e11.65 04/14/20  Yes [name], MD  Continuous Blood Gluc Transmit (DEXCOM G6 TRANSMITTER) MISC Use as directed to check blood sugars 3 times per day dx: e11.65 04/14/20  Yes [name], MD  fluticasone [facility]) 50 MCG/ACT nasal spray Place 2 sprays into the nose daily as needed for allergies.    Yes [provider]  furosemide (LASIX) 40 MG tablet Take 40-80 mg by mouth See admin  instructions. 80 mg twice a week. 40 mg all other days   Yes [provider]  insulin aspart protamine - aspart (NOVOLOG MIX 70/30 FLEXPEN) (70-30) 100 UNIT/ML FlexPen Inject 0.3 mLs (30 Units total) into the skin 2 (two) times daily with a meal. Patient taking differently: Inject into the skin 2 (two) times daily with a meal. 35 units in the morning and 25 units at night 08/12/20  Yes [name], MD  lactobacillus acidophilus (BACID) TABS Take 1 tablet by mouth daily. PROBIOTIC   Yes [provider]  magnesium oxide (MAG-OX) 400 (241.3 Mg) MG tablet Take 400 mg by mouth daily.   Yes [provider]  OneTouch Delica Lancets 60F MISC Use as directed to check blood sugars before breakfast and dinner dx: e11.65 04/22/20  Yes [name], MD  [facility] VERIO test strip CHECK BLOOD SUGAR TWICE DAILY AS DIRECTED 02/09/21  Yes [name], MD  polyethylene glycol (MIRALAX / GLYCOLAX) 17 g packet Take 17 g by mouth daily. Patient taking differently: Take 17 g by mouth daily as needed for mild constipation. 03/20/20  Yes [name], MD  Polyvinyl Alcohol-Povidone (REFRESH OP) Place 1 drop into both eyes daily as needed (dry eyes).   Yes [provider]  rosuvastatin (CRESTOR) 20 MG tablet Take 1 tablet (20 mg total) by mouth daily. 11/19/20  Yes Belva Crome, MD  SYMBICORT 160-4.5 MCG/ACT inhaler Inhale 2 puffs into the lungs 2 (two) times daily. 02/26/21  Yes [name], MD  valsartan-hydrochlorothiazide (DIOVAN-HCT) 320-12.5 MG tablet Take 1 tablet by mouth daily. 12/29/20  Yes Belva Crome, MD  vitamin C (ASCORBIC ACID) 500 MG tablet Take 500 mg by mouth daily.   Yes [provider]  mometasone-formoterol (DULERA) 200-5 MCG/ACT AERO Inhale 2 puffs into the lungs 2 (two) times daily. Patient not taking: Reported on 03/09/2021 02/25/21   [name], MD    Physical Exam: Vitals:   03/09/21 2037 03/09/21 2120 03/09/21 2220 03/09/21 2245  BP: (!)  170/106 (!) 198/80 (!) 205/111 (!) 204/111  Pulse: 83 87 88 85  Resp: 16 (!) 40 (!) 22 (!) 22  Temp: 97.6 F (36.4 C)     TempSrc: Oral     SpO2: 98% 96% 100% 100%  Weight:      Height:        Constitutional: NAD, calm  Eyes: PERTLA, lids and conjunctivae normal ENMT: Mucous membranes are moist. Posterior pharynx clear of any exudate or lesions.   Neck:  supple, no masses  Respiratory:  no wheezing. No accessory muscle use.  Cardiovascular: S1 & S2 heard, regular rate and rhythm. Pretibial pitting edema bilaterally. Abdomen: No distension, no tenderness, soft. Bowel sounds active.  Musculoskeletal: no clubbing / cyanosis. No joint deformity upper and lower extremities.   Skin: no significant rashes, lesions, ulcers. Warm, dry, well-perfused. Neurologic: CN 2-12 grossly intact. Sensation intact. Moving all extremities.  Psychiatric: Sleeping, wakes to voice and oriented to person, place, and situation. Calm and cooperative.    Labs and Imaging on Admission: I have personally reviewed following labs and imaging studies  CBC: Recent Labs  Lab 03/09/21 1435 03/09/21 2256  WBC 9.7  --   NEUTROABS 6.4  --   HGB 7.4* 8.2*  HCT 22.8* 24.0*  MCV 86.4  --   PLT 398  --    Basic Metabolic Panel: Recent Labs  Lab 03/09/21 1435 03/09/21 2256  NA 138 142  K 4.5 4.4  CL 108  --   CO2 22  --   GLUCOSE 295*  --   BUN 58*  --   CREATININE 5.35*  --   CALCIUM 8.8*  --    GFR: Estimated Creatinine Clearance: 10.7 mL/min (A) (by C-G formula based on SCr of 5.35 mg/dL (H)). Liver Function Tests: Recent Labs  Lab 03/09/21 1435  AST 27  ALT 49*  ALKPHOS 96  BILITOT 0.5  PROT 6.3*  ALBUMIN 3.0*   No results for input(s): LIPASE, AMYLASE in the last 168 hours. No results  for input(s): AMMONIA in the last 168 hours. Coagulation Profile: No results for input(s): INR, PROTIME in the last 168 hours. Cardiac Enzymes: No results for input(s): CKTOTAL, CKMB, CKMBINDEX, TROPONINI  in the last 168 hours. BNP (last 3 results) No results for input(s): PROBNP in the last 8760 hours. HbA1C: No results for input(s): HGBA1C in the last 72 hours. CBG: No results for input(s): GLUCAP in the last 168 hours. Lipid Profile: No results for input(s): CHOL, HDL, LDLCALC, TRIG, CHOLHDL, LDLDIRECT in the last 72 hours. Thyroid Function Tests: No results for input(s): TSH, T4TOTAL, FREET4, T3FREE, THYROIDAB in the last 72 hours. Anemia Panel: No results for input(s): VITAMINB12, FOLATE, FERRITIN, TIBC, IRON, RETICCTPCT in the last 72 hours. Urine analysis:    Component Value Date/Time   COLORURINE YELLOW 03/09/2021 2156   APPEARANCEUR HAZY (A) 03/09/2021 2156   LABSPEC 1.015 03/09/2021 2156   PHURINE 6.0 03/09/2021 2156   GLUCOSEU >=500 (A) 03/09/2021 2156   HGBUR SMALL (A) 03/09/2021 2156   BILIRUBINUR NEGATIVE 03/09/2021 2156   BILIRUBINUR negative 06/18/2019 1409   KETONESUR NEGATIVE 03/09/2021 2156   PROTEINUR >=300 (A) 03/09/2021 2156   UROBILINOGEN 0.2 06/18/2019 1409   NITRITE NEGATIVE 03/09/2021 2156   LEUKOCYTESUR NEGATIVE 03/09/2021 2156   Sepsis Labs: @LABRCNTIP (procalcitonin:4,lacticidven:4) ) Recent Results (from the past 240 hour(s))  Resp Panel by RT-PCR (Flu A&B, Covid) Nasopharyngeal Swab     Status: None   Collection Time: 03/09/21  9:28 PM   Specimen: Nasopharyngeal Swab; Nasopharyngeal(NP) swabs in vial transport medium  Result Value Ref Range Status   SARS Coronavirus 2 by RT PCR NEGATIVE NEGATIVE Final    Comment: (NOTE) SARS-CoV-2 target nucleic acids are NOT DETECTED.  The SARS-CoV-2 RNA is generally detectable in upper respiratory specimens during the acute phase of infection. The lowest concentration of SARS-CoV-2 viral copies this assay can detect is 138 copies/mL. A negative result does not preclude SARS-Cov-2 infection and should not be used as the sole basis for treatment or other patient management decisions. A negative result may  occur with  improper specimen collection/handling, submission of specimen other than nasopharyngeal swab, presence of viral mutation(s) within the areas targeted by this assay, and inadequate number of viral copies(<138 copies/mL). A negative result must be combined with clinical observations, patient history, and epidemiological information. The expected result is Negative.  Fact Sheet for Patients:  EntrepreneurPulse.com.au  Fact Sheet for Healthcare Providers:  IncredibleEmployment.be  This test is no t yet approved or cleared by the [place] FDA and  has been authorized for detection and/or diagnosis of SARS-CoV-2 by FDA under an Emergency Use Authorization (EUA). This EUA will remain  in effect (meaning this test can be used) for the duration of the COVID-19 declaration under Section 564(b)(1) of the Act, 21 U.S.C.section 360bbb-3(b)(1), unless the authorization is terminated  or revoked sooner.       Influenza A by PCR NEGATIVE NEGATIVE Final   Influenza B by PCR NEGATIVE NEGATIVE Final    Comment: (NOTE) The Xpert Xpress SARS-CoV-2/FLU/RSV plus assay is intended as an aid in the diagnosis of influenza from Nasopharyngeal swab specimens and should not be used as a sole basis for treatment. Nasal washings and aspirates are unacceptable for Xpert Xpress SARS-CoV-2/FLU/RSV testing.  Fact Sheet for Patients: EntrepreneurPulse.com.au  Fact Sheet for Healthcare Providers: IncredibleEmployment.be  This test is not yet approved or cleared by the [place] FDA and has been authorized for detection and/or diagnosis of SARS-CoV-2 by FDA under an Emergency Use  Authorization (EUA). This EUA will remain in effect (meaning this test can be used) for the duration of the COVID-19 declaration under Section 564(b)(1) of the Act, 21 U.S.C. section 360bbb-3(b)(1), unless the authorization is terminated  or revoked.  Performed at [facility], [place] [address] [place], [place] [address]      Radiological Exams on Admission: DG Chest 2 View  Result Date: 03/09/2021 CLINICAL DATA:  Shortness of breath. Decreased hemoglobin level to 7.2, dyspnea on exertion. EXAM: CHEST - 2 VIEW COMPARISON:  January 29, 2017. FINDINGS: Limited evaluation due to patient body habitus. Trachea is midline. Cardiomediastinal contours with cardiac enlargement and central pulmonary vascular engorgement. Fullness of the RIGHT and LEFT hilum may relate to this phenomenon but is not well assessed. No lobar level consolidative changes. Suggestion of LEFT-sided effusion on lateral radiograph and basilar opacity. On limited assessment no acute skeletal process. IMPRESSION: 1. Cardiomegaly and central pulmonary vascular engorgement. Correlate with any signs of heart failure. 2. Indeterminate fullness of the hila bilaterally. This may relate to congestive changes at the level of the bilateral hila not well assessed in AP projection. When the patient is able consider PA and lateral chest radiograph on follow-up to exclude hilar nodal enlargement. 3. Potential LEFT effusion and basilar airspace disease. Correlate with any signs of heart failure or pneumonia. Electronically Signed   By: Zetta Bills M.D.   On: 03/09/2021 15:48    EKG: Independently reviewed. Sinus rhythm.   Assessment/Plan   1. Acute on chronic diastolic CHF  - Presents with progressive SOB and leg swelling and is found to have BNP 818 and CXR with cardiomegaly, vascular congestion, and possible left pleural effusion  - EF was preserved on stress test and cath in 2018  - She was given 80 mg IV Lasix in ED and has begun to diurese  - Continue diuresis with 60 mg IV Lasix q12h adjusted as needed, monitor weight and I/Os, monitor renal function and electrolytes, and check echocardiogram   2. CKD V  - SCr is 5.35 on admission, up from 5.05 three weeks ago  -  Renally-dose medications, monitor closely while diuresing    3. Insulin-dependent DM  - A1c was 6.2% in April 2022  - Continue CBG checks and insulin   4. Hypertensive urgency  - BP as high as 210/110 in ED, treated with nitroglycerin in ED  - Anticipate improvement with diuresis  - Hold ARB in light of increased creatinine, continue diuresis, continue NTG for now, use hydralazine as needed   5. CAD - No anginal complaints  - Continue ASA and statin    6. Anemia  - Hgb is 7.4 on admission without overt bleeding  - Likely secondary to CKD  - Monitor, transfuse if needed    7. Asthma  - No cough or wheezing on admission  - Continue ICS/LABA and as needed albuterol     DVT prophylaxis: sq heparin  Code Status: Full  Level of Care: Level of care: Telemetry Cardiac Family Communication: Husband updated at bedside Disposition Plan:  Patient is from: Home  Anticipated d/c is to: TBD Anticipated d/c date is: 03/13/21 Patient currently: Pending diuresis, echo, monitoring of renal function  Consults called: None  Admission status: Inpatient     Vianne Bulls, MD [facility]  03/10/2021, 12:15 AM

## 2021-03-10 NOTE — Progress Notes (Signed)
PROGRESS NOTE    Carolyn Russo  TKP:[id number] DOB: 12-24-1950 DOA: 03/09/2021 PCP: [name], MD    Chief Complaint  Patient presents with  . Low Hemoglobin   . Leg Swelling    Brief Narrative: 70 year old lady prior history of stage IV CKD, insulin-dependent diabetes mellitus, coronary artery disease, hypertension presents to ED for worsening sob, bilateral lower extremity swelling. Chest x-ray with cardiomegaly, vascular congestion, possible left pleural effusion, and indeterminate bilateral hilar fullness.  Chemistry panel features a creatinine of 5.35 and glucose 295.  CBC notable for hemoglobin of 7.4.  High-sensitivity troponin was 51 then 54.  BNP elevated to 819.  Patient was given 80 mg IV Lasix, nitroglycerin, and acetaminophen in the ED.   Assessment & Plan:   Principal Problem:   Acute on chronic diastolic CHF (congestive heart failure) (HCC) Active Problems:   Type 2 diabetes mellitus (HCC)   CAD (coronary artery disease), native coronary artery   Asthma   Anemia in chronic kidney disease   CKD (chronic kidney disease), stage V (HCC)   Hypertensive urgency   Acute on chronic diastolic CHF:  Improving , on IV lasix 60 gm BID.  Continue with strict intake and output/ daily weights.     Stage 4 CKD:  Renally dose medications.     Insulin dependent DM A1C IS 6.2% CBG (last 3)  No results for input(s): GLUCAP in the last 72 hours. Resume SSI and continue with Levemir.    Hypertensive Urgency:  Improving.     CAD:  Continue with aspirin and statin.    Anemia of chronic disease sec to stage 4 CKD.  Transfuse to keep hemoglobin greater than 7.    Asthma;  No wheezing or rhonchi.  Continue with bronchodilators.    Hyperlipidemia:  Resume crestor.    DVT prophylaxis: heparin.  Code Status: (Full code.  Family Communication: none at bedside.  Disposition:   Status is: Inpatient  Remains inpatient appropriate because:Ongoing  diagnostic testing needed not appropriate for outpatient work up and IV treatments appropriate due to intensity of illness or inability to take PO   Dispo: The patient is from: Home              Anticipated d/c is to: Home              Patient currently is not medically stable to d/c.   Difficult to place patient No       Consultants:   none   Procedures: none.    Antimicrobials: none.    Subjective: Sob persistent.   Objective: Vitals:   03/10/21 0500 03/10/21 0600 03/10/21 0717 03/10/21 0721  BP: (!) 176/91 (!) 171/86 (!) 165/87   Pulse: 82 77 76 77  Resp: (!) 25 (!) 23 20 20   Temp:      TempSrc:      SpO2: 96% 100% 100% 100%  Weight:      Height:        Intake/Output Summary (Last 24 hours) at 03/10/2021 1054 Last data filed at 03/10/2021 5170 Gross per 24 hour  Intake --  Output 100 ml  Net -100 ml   Filed Weights   03/09/21 1425  Weight: 99.8 kg    Examination:  General exam: Appears calm and comfortable  Respiratory system: diminished air entry at bases.  Cardiovascular system: S1 & S2 heard, RRR. No murmurs,  Positive for pedal edema. Gastrointestinal system: Abdomen is nondistended, soft and nontender.  Normal bowel sounds heard.  Central nervous system: Alert and oriented. No focal neurological deficits. Extremities: Symmetric 5 x 5 power. Leg edema present.  Skin: No rashes, lesions or ulcers Psychiatry:  Mood & affect appropriate.     Data Reviewed: I have personally reviewed following labs and imaging studies  CBC: Recent Labs  Lab 03/04/21 1517 03/09/21 1435 03/09/21 2256  WBC  --  9.7  --   NEUTROABS  --  6.4  --   HGB 7.7* 7.4* 8.2*  HCT  --  22.8* 24.0*  MCV  --  86.4  --   PLT  --  398  --     Basic Metabolic Panel: Recent Labs  Lab 03/09/21 1435 03/09/21 2256  NA 138 142  K 4.5 4.4  CL 108  --   CO2 22  --   GLUCOSE 295*  --   BUN 58*  --   CREATININE 5.35*  --   CALCIUM 8.8*  --     GFR: Estimated  Creatinine Clearance: 10.7 mL/min (A) (by C-G formula based on SCr of 5.35 mg/dL (H)).  Liver Function Tests: Recent Labs  Lab 03/09/21 1435  AST 27  ALT 49*  ALKPHOS 96  BILITOT 0.5  PROT 6.3*  ALBUMIN 3.0*    CBG: No results for input(s): GLUCAP in the last 168 hours.   Recent Results (from the past 240 hour(s))  Resp Panel by RT-PCR (Flu A&B, Covid) Nasopharyngeal Swab     Status: None   Collection Time: 03/09/21  9:28 PM   Specimen: Nasopharyngeal Swab; Nasopharyngeal(NP) swabs in vial transport medium  Result Value Ref Range Status   SARS Coronavirus 2 by RT PCR NEGATIVE NEGATIVE Final    Comment: (NOTE) SARS-CoV-2 target nucleic acids are NOT DETECTED.  The SARS-CoV-2 RNA is generally detectable in upper respiratory specimens during the acute phase of infection. The lowest concentration of SARS-CoV-2 viral copies this assay can detect is 138 copies/mL. A negative result does not preclude SARS-Cov-2 infection and should not be used as the sole basis for treatment or other patient management decisions. A negative result may occur with  improper specimen collection/handling, submission of specimen other than nasopharyngeal swab, presence of viral mutation(s) within the areas targeted by this assay, and inadequate number of viral copies(<138 copies/mL). A negative result must be combined with clinical observations, patient history, and epidemiological information. The expected result is Negative.  Fact Sheet for Patients:  EntrepreneurPulse.com.au  Fact Sheet for Healthcare Providers:  IncredibleEmployment.be  This test is no t yet approved or cleared by the [place] FDA and  has been authorized for detection and/or diagnosis of SARS-CoV-2 by FDA under an Emergency Use Authorization (EUA). This EUA will remain  in effect (meaning this test can be used) for the duration of the COVID-19 declaration under Section 564(b)(1) of  the Act, 21 U.S.C.section 360bbb-3(b)(1), unless the authorization is terminated  or revoked sooner.       Influenza A by PCR NEGATIVE NEGATIVE Final   Influenza B by PCR NEGATIVE NEGATIVE Final    Comment: (NOTE) The Xpert Xpress SARS-CoV-2/FLU/RSV plus assay is intended as an aid in the diagnosis of influenza from Nasopharyngeal swab specimens and should not be used as a sole basis for treatment. Nasal washings and aspirates are unacceptable for Xpert Xpress SARS-CoV-2/FLU/RSV testing.  Fact Sheet for Patients: EntrepreneurPulse.com.au  Fact Sheet for Healthcare Providers: IncredibleEmployment.be  This test is not yet approved or cleared by the [place] FDA and has been authorized for detection  and/or diagnosis of SARS-CoV-2 by FDA under an Emergency Use Authorization (EUA). This EUA will remain in effect (meaning this test can be used) for the duration of the COVID-19 declaration under Section 564(b)(1) of the Act, 21 U.S.C. section 360bbb-3(b)(1), unless the authorization is terminated or revoked.  Performed at [facility], [place] [address] [place], [place] [address]          Radiology Studies: DG Chest 2 View  Result Date: 03/10/2021 CLINICAL DATA:  Hilar enlargement on chest radiograph EXAM: CHEST - 2 VIEW COMPARISON:  03/09/2021 FINDINGS: The heart is enlarged. Pulmonary vascular congestion present. Diffuse bilateral airspace opacities, progressed since prior examination. IMPRESSION: Radiographic findings most consistent with CHF/fluid volume overload, which has progressed since the prior exam. Electronically Signed   By: Miachel Roux M.D.   On: 03/10/2021 08:37   DG Chest 2 View  Result Date: 03/09/2021 CLINICAL DATA:  Shortness of breath. Decreased hemoglobin level to 7.2, dyspnea on exertion. EXAM: CHEST - 2 VIEW COMPARISON:  January 29, 2017. FINDINGS: Limited evaluation due to patient body habitus. Trachea is midline.  Cardiomediastinal contours with cardiac enlargement and central pulmonary vascular engorgement. Fullness of the RIGHT and LEFT hilum may relate to this phenomenon but is not well assessed. No lobar level consolidative changes. Suggestion of LEFT-sided effusion on lateral radiograph and basilar opacity. On limited assessment no acute skeletal process. IMPRESSION: 1. Cardiomegaly and central pulmonary vascular engorgement. Correlate with any signs of heart failure. 2. Indeterminate fullness of the hila bilaterally. This may relate to congestive changes at the level of the bilateral hila not well assessed in AP projection. When the patient is able consider PA and lateral chest radiograph on follow-up to exclude hilar nodal enlargement. 3. Potential LEFT effusion and basilar airspace disease. Correlate with any signs of heart failure or pneumonia. Electronically Signed   By: Zetta Bills M.D.   On: 03/09/2021 15:48        Scheduled Meds: . aspirin  81 mg Oral Daily  . calcitRIOL  0.25 mcg Oral Once per day on Mon Wed Fri  . furosemide  60 mg Intravenous Q12H  . heparin  5,000 Units Subcutaneous Q8H  . insulin aspart  0-5 Units Subcutaneous QHS  . insulin aspart  0-9 Units Subcutaneous TID WC  . insulin detemir  15 Units Subcutaneous BID  . mometasone-formoterol  2 puff Inhalation BID  . rosuvastatin  10 mg Oral Daily   Continuous Infusions: . sodium chloride       LOS: 1 day        Hosie Poisson, MD [facility]   To contact the attending provider between 7A-7P or the covering provider during after hours 7P-7A, please log into the web site [email or web address] and access using universal [place] password for that web site. If you do not have the password, please call the hospital operator.  03/10/2021, 10:54 AM

## 2021-03-10 NOTE — ED Notes (Signed)
Called and spoke with pharmacy RE pt meds.

## 2021-03-11 ENCOUNTER — Inpatient Hospital Stay (HOSPITAL_COMMUNITY): Payer: Medicare Other

## 2021-03-11 DIAGNOSIS — D631 Anemia in chronic kidney disease: Secondary | ICD-10-CM | POA: Diagnosis not present

## 2021-03-11 DIAGNOSIS — N185 Chronic kidney disease, stage 5: Secondary | ICD-10-CM | POA: Diagnosis not present

## 2021-03-11 DIAGNOSIS — I5033 Acute on chronic diastolic (congestive) heart failure: Secondary | ICD-10-CM | POA: Diagnosis not present

## 2021-03-11 DIAGNOSIS — J810 Acute pulmonary edema: Secondary | ICD-10-CM | POA: Diagnosis not present

## 2021-03-11 DIAGNOSIS — I5031 Acute diastolic (congestive) heart failure: Secondary | ICD-10-CM | POA: Diagnosis not present

## 2021-03-11 LAB — CBC
HCT: 22.8 % — ABNORMAL LOW (ref 36.0–46.0)
Hemoglobin: 7.5 g/dL — ABNORMAL LOW (ref 12.0–15.0)
MCH: 28.4 pg (ref 26.0–34.0)
MCHC: 32.9 g/dL (ref 30.0–36.0)
MCV: 86.4 fL (ref 80.0–100.0)
Platelets: 421 10*3/uL — ABNORMAL HIGH (ref 150–400)
RBC: 2.64 MIL/uL — ABNORMAL LOW (ref 3.87–5.11)
RDW: 14.7 % (ref 11.5–15.5)
WBC: 8.1 10*3/uL (ref 4.0–10.5)
nRBC: 0.2 % (ref 0.0–0.2)

## 2021-03-11 LAB — ECHOCARDIOGRAM COMPLETE
AR max vel: 1.61 cm2
AV Area VTI: 1.55 cm2
AV Area mean vel: 1.61 cm2
AV Mean grad: 5 mmHg
AV Peak grad: 9.7 mmHg
Ao pk vel: 1.56 m/s
Area-P 1/2: 3.91 cm2
Height: 63 in
MV VTI: 1.67 cm2
S' Lateral: 3.4 cm
Weight: 3494.4 oz

## 2021-03-11 LAB — MAGNESIUM: Magnesium: 2 mg/dL (ref 1.7–2.4)

## 2021-03-11 LAB — BASIC METABOLIC PANEL
Anion gap: 9 (ref 5–15)
BUN: 65 mg/dL — ABNORMAL HIGH (ref 8–23)
CO2: 20 mmol/L — ABNORMAL LOW (ref 22–32)
Calcium: 8.3 mg/dL — ABNORMAL LOW (ref 8.9–10.3)
Chloride: 110 mmol/L (ref 98–111)
Creatinine, Ser: 5.95 mg/dL — ABNORMAL HIGH (ref 0.44–1.00)
GFR, Estimated: 7 mL/min — ABNORMAL LOW (ref >60)
Glucose, Bld: 80 mg/dL (ref 70–99)
Potassium: 4.4 mmol/L (ref 3.5–5.1)
Sodium: 139 mmol/L (ref 135–145)

## 2021-03-11 LAB — GLUCOSE, CAPILLARY
Glucose-Capillary: 82 mg/dL (ref 70–99)
Glucose-Capillary: 119 mg/dL — ABNORMAL HIGH (ref 70–99)
Glucose-Capillary: 162 mg/dL — ABNORMAL HIGH (ref 70–99)
Glucose-Capillary: 182 mg/dL — ABNORMAL HIGH (ref 70–99)

## 2021-03-11 LAB — PHOSPHORUS: Phosphorus: 5.7 mg/dL — ABNORMAL HIGH (ref 2.5–4.6)

## 2021-03-11 LAB — ABO/RH: ABO/RH(D): O POS

## 2021-03-11 MED ORDER — PERFLUTREN LIPID MICROSPHERE
1.0000 mL | INTRAVENOUS | Status: AC | PRN
  Administered 2021-03-11: 3 mL via INTRAVENOUS
  Filled 2021-03-11: qty 10

## 2021-03-11 MED ORDER — FLUTICASONE PROPIONATE 50 MCG/ACT NA SUSP
2.0000 | Freq: Every day | NASAL | Status: DC
  Administered 2021-03-11 – 2021-03-24 (×12): 2 via NASAL
  Filled 2021-03-11: qty 16

## 2021-03-11 MED ORDER — IPRATROPIUM-ALBUTEROL 0.5-2.5 (3) MG/3ML IN SOLN: 3.0000 mL | Freq: Four times a day (QID) | RESPIRATORY_TRACT | Status: DC | PRN

## 2021-03-11 MED ORDER — HYDRALAZINE HCL 25 MG PO TABS
25.0000 mg | ORAL_TABLET | Freq: Three times a day (TID) | ORAL | Status: DC
  Administered 2021-03-11 – 2021-03-24 (×34): 25 mg via ORAL
  Filled 2021-03-11 (×36): qty 1

## 2021-03-11 NOTE — Consult Note (Signed)
[facility] Consult Note     Date: 03/11/2021                  Patient Name:  Carolyn Russo  MRN: [id number]  DOB: [birth year]  Age / Sex: 70 y.o., female         PCP: [name], MD                 Service Requesting Consult: [facility]                 Reason for Consult: AKI on CKD            Chief Complaint: SHOB HPI: 70 year old female with a pertinent PMH of CKD (Stage IV-V), insulin dependant DM, CAD, iron deficiency anemia, HTN, asthma, who presented to the ED with progressive [facility] with exertion and bilateral LE edema over the past several weeks despite recently increasing her dose of lasix to 40 mg every other day. On admission she was found to be in decompensated heart failure with a new oxygen requirement of 2L [place], vascular congestion on CXR and an elevated BNP at 818.7.  She was given 80 mg of IV lasix in the ED and has since been on 60 mg of IV lasix BID with roughly 2 L diuresed since admission. Additionally, she presented with an AKI with a Cr of 5.35>5.95, which was elevated from her baseline of 5.05 in April of 2022. The Patient follows with Dr. Johnney Ou at [facility].   On evaluation today, the patient is resting in bed and appears SHOB at rest on RA. She states that she has been having SHOB with exertion over the past couple of weeks and noticed increased lower extremity edema despite recent increase lasix to 40 mg every other day. She states that she was recently referred VSS for permanent access but states that she was not ready for the procedure.  Past Medical History:  Diagnosis Date  . Allergy   . Anterior chest wall pain   . Appendicitis 1965  . Asthma   . Body mass index 37.0-37.9, adult   . Breast pain   . Cataract    both eyes  . Chronic renal disease, stage II   . Dehydration 2014  . Deviated septum 1971  . Diabetes mellitus   . Dyspnea 2014  . Extrinsic asthma    WITH ASTHMA ATTACK  . Fibroid 1980  . GERD (gastroesophageal reflux  disease)   . Heart murmur   . Hx gestational diabetes   . Hyperlipidemia   . Hypertension 2014  . Inguinal hernia 1959  . Malaise and fatigue 2014  . Nephropathy   . Non-IgE mediated allergic asthma 2014  . Obesity   . Pelvic pain   . Pregnancy, high-risk 1985  . Tonsillitis 1968  . Uterine fibroid 1980    Past Surgical History:  Procedure Laterality Date  . APPENDECTOMY  1959  . CESAREAN SECTION  1985  . COLONOSCOPY    . EYE SURGERY     bilateral cataract   . HERNIA REPAIR  1959  . LEFT HEART CATH AND CORONARY ANGIOGRAPHY N/A 04/04/2017   Procedure: Left Heart Cath and Coronary Angiography;  Surgeon: Belva Crome, MD;  Location: [facility];  Service: Cardiovascular;  Laterality: N/A;  . [place], 2004, 2007  . RHINOPLASTY  1971  . ROTATOR CUFF REPAIR  2003  . SURGICAL REPAIR OF HEMORRHAGE  2015  . TONSILLECTOMY  1968  Family History  Problem Relation Age of Onset  . Cancer Mother        abdominal melamona  . Psoriasis Mother   . Alzheimer's disease Father   . Cancer Cousin        colon   . Diabetes Cousin   . Diabetes Maternal Aunt   . Colon cancer Neg Hx   . Colon polyps Neg Hx   . Esophageal cancer Neg Hx   . Rectal cancer Neg Hx   . Stomach cancer Neg Hx   . Breast cancer Neg Hx    Social History:  reports that she has never smoked. She has never used smokeless tobacco. She reports that she does not drink alcohol and does not use drugs.  Allergies:  Allergies  Allergen Reactions  . Food Anaphylaxis    Peanuts; Almonds  . Other Shortness Of Breath    Peanuts; Almonds  . Wheat Bran Shortness Of Breath  . Statins Itching and Other (See Comments)    Generalized aches  . Iodine Other (See Comments)  . Shellfish Allergy Other (See Comments)    Mouth gets raw  . Sitagliptin     Other reaction(s): Unknown  . Tetracycline Other (See Comments)    Raw mouth  . Tetracyclines & Related Itching  . Contrast Media [Iodinated Diagnostic  Agents] Rash    Happened during CT scan over 30 years ago    Medications Prior to Admission  Medication Sig Dispense Refill  . albuterol (PROVENTIL) (2.5 MG/3ML) 0.083% nebulizer solution Take 3 mLs (2.5 mg total) by nebulization every 4 (four) hours as needed for wheezing or shortness of breath. 75 mL 2  . albuterol (VENTOLIN HFA) 108 (90 Base) MCG/ACT inhaler Inhale 2 puffs into the lungs every 4 (four) hours as needed for wheezing or shortness of breath. 18 g 3  . aspirin 81 MG tablet Take 81 mg by mouth daily.    Marland Kitchen b complex vitamins capsule Take 1 capsule by mouth daily.    Marland Kitchen BESIVANCE 0.6 % SUSP Place 1 drop into both eyes See admin instructions. After  eye injections    . calcitRIOL (ROCALTROL) 0.25 MCG capsule Take 0.25 mcg by mouth 3 (three) times a week.    Gladis Riffle Oil-Levomenthol (FDGARD PO) Take 1 tablet by mouth 2 (two) times daily as needed (nausea).    . Cholecalciferol (VITAMIN D3 PO) Take 2,000 Units by mouth daily.    . Continuous Blood Gluc Receiver (DEXCOM G6 RECEIVER) DEVI Use as directed to check blood sugars 3 times per day dx: e11.65 1 each 1  . Continuous Blood Gluc Sensor (DEXCOM G6 SENSOR) MISC Use as directed to check blood sugars 3 times per day dx: e11.65 3 each 2  . Continuous Blood Gluc Transmit (DEXCOM G6 TRANSMITTER) MISC Use as directed to check blood sugars 3 times per day dx: e11.65 1 each 1  . fluticasone (FLONASE) 50 MCG/ACT nasal spray Place 2 sprays into the nose daily as needed for allergies.     . furosemide (LASIX) 40 MG tablet Take 40-80 mg by mouth See admin instructions. 80 mg twice a week. 40 mg all other days    . insulin aspart protamine - aspart (NOVOLOG MIX 70/30 FLEXPEN) (70-30) 100 UNIT/ML FlexPen Inject 0.3 mLs (30 Units total) into the skin 2 (two) times daily with a meal. (Patient taking differently: Inject into the skin 2 (two) times daily with a meal. 35 units in the morning and 25 units at night)  15 mL 0  . lactobacillus acidophilus  (BACID) TABS Take 1 tablet by mouth daily. PROBIOTIC    . magnesium oxide (MAG-OX) 400 (241.3 Mg) MG tablet Take 400 mg by mouth daily.    Glory Rosebush Delica Lancets 19J MISC Use as directed to check blood sugars before breakfast and dinner dx: e11.65 100 each 11  . ONETOUCH VERIO test strip CHECK BLOOD SUGAR TWICE DAILY AS DIRECTED 50 strip 3  . polyethylene glycol (MIRALAX / GLYCOLAX) 17 g packet Take 17 g by mouth daily. (Patient taking differently: Take 17 g by mouth daily as needed for mild constipation.) 14 each 0  . Polyvinyl Alcohol-Povidone (REFRESH OP) Place 1 drop into both eyes daily as needed (dry eyes).    . rosuvastatin (CRESTOR) 20 MG tablet Take 1 tablet (20 mg total) by mouth daily. 90 tablet 3  . SYMBICORT 160-4.5 MCG/ACT inhaler Inhale 2 puffs into the lungs 2 (two) times daily. 10.2 g 5  . valsartan-hydrochlorothiazide (DIOVAN-HCT) 320-12.5 MG tablet Take 1 tablet by mouth daily. 90 tablet 2  . vitamin C (ASCORBIC ACID) 500 MG tablet Take 500 mg by mouth daily.    . mometasone-formoterol (DULERA) 200-5 MCG/ACT AERO Inhale 2 puffs into the lungs 2 (two) times daily. (Patient not taking: Reported on 03/09/2021) 1 each 1    Results for orders placed or performed during the hospital encounter of 03/09/21 (from the past 48 hour(s))  CBC with Differential     Status: Abnormal   Collection Time: 03/09/21  2:35 PM  Result Value Ref Range   WBC 9.7 4.0 - 10.5 K/uL   RBC 2.64 (L) 3.87 - 5.11 MIL/uL   Hemoglobin 7.4 (L) 12.0 - 15.0 g/dL   HCT 22.8 (L) 36.0 - 46.0 %   MCV 86.4 80.0 - 100.0 fL   MCH 28.0 26.0 - 34.0 pg   MCHC 32.5 30.0 - 36.0 g/dL   RDW 14.7 11.5 - 15.5 %   Platelets 398 150 - 400 K/uL   nRBC 0.0 0.0 - 0.2 %   Neutrophils Relative % 67 %   Neutro Abs 6.4 1.7 - 7.7 K/uL   Lymphocytes Relative 20 %   Lymphs Abs 1.9 0.7 - 4.0 K/uL   Monocytes Relative 12 %   Monocytes Absolute 1.2 (H) 0.1 - 1.0 K/uL   Eosinophils Relative 1 %   Eosinophils Absolute 0.1 0.0 - 0.5  K/uL   Basophils Relative 0 %   Basophils Absolute 0.0 0.0 - 0.1 K/uL   Immature Granulocytes 0 %   Abs Immature Granulocytes 0.04 0.00 - 0.07 K/uL    Comment: Performed at [name], [address] , [place] [address]  Comprehensive metabolic panel     Status: Abnormal   Collection Time: 03/09/21  2:35 PM  Result Value Ref Range   Sodium 138 135 - 145 mmol/L   Potassium 4.5 3.5 - 5.1 mmol/L   Chloride 108 98 - 111 mmol/L   CO2 22 22 - 32 mmol/L   Glucose, Bld 295 (H) 70 - 99 mg/dL    Comment: Glucose reference range applies only to samples taken after fasting for at least 8 hours.   BUN 58 (H) 8 - 23 mg/dL   Creatinine, Ser 5.35 (H) 0.44 - 1.00 mg/dL   Calcium 8.8 (L) 8.9 - 10.3 mg/dL   Total Protein 6.3 (L) 6.5 - 8.1 g/dL   Albumin 3.0 (L) 3.5 - 5.0 g/dL   AST 27 15 - 41  U/L   ALT 49 (H) 0 - 44 U/L   Alkaline Phosphatase 96 38 - 126 U/L   Total Bilirubin 0.5 0.3 - 1.2 mg/dL   GFR, Estimated 8 (L) >60 mL/min    Comment: (NOTE) Calculated using the CKD-EPI Creatinine Equation (2021)    Anion gap 8 5 - 15    Comment: Performed at [facility] [address] [place], [place] [address]  Brain natriuretic peptide     Status: Abnormal   Collection Time: 03/09/21  2:35 PM  Result Value Ref Range   B Natriuretic Peptide 818.7 (H) 0.0 - 100.0 pg/mL    Comment: Performed at [facility] [address], [place], [place] [address]  Type and screen [place]     Status: None   Collection Time: 03/09/21  2:48 PM  Result Value Ref Range   ABO/RH(D) O POS    Antibody Screen NEG    Sample Expiration      03/12/2021,2359 Performed at [facility], [place] [address], [place], [name] [address]   Troponin I (High Sensitivity)     Status: Abnormal   Collection Time: 03/09/21  9:10 PM  Result Value Ref Range   Troponin I (High Sensitivity) 51 (H) <18 ng/L    Comment: (NOTE) Elevated high sensitivity troponin I (hsTnI) values and  significant  changes across serial measurements may suggest ACS but many other  chronic and acute conditions are known to elevate hsTnI results.  Refer to the "Links" section for chest pain algorithms and additional  guidance. Performed at [facility], [place] [address] [place], [place] [address]   Resp Panel by RT-PCR (Flu A&B, Covid) Nasopharyngeal Swab     Status: None   Collection Time: 03/09/21  9:28 PM   Specimen: Nasopharyngeal Swab; Nasopharyngeal(NP) swabs in vial transport medium  Result Value Ref Range   SARS Coronavirus 2 by RT PCR NEGATIVE NEGATIVE    Comment: (NOTE) SARS-CoV-2 target nucleic acids are NOT DETECTED.  The SARS-CoV-2 RNA is generally detectable in upper respiratory specimens during the acute phase of infection. The lowest concentration of SARS-CoV-2 viral copies this assay can detect is 138 copies/mL. A negative result does not preclude SARS-Cov-2 infection and should not be used as the sole basis for treatment or other patient management decisions. A negative result may occur with  improper specimen collection/handling, submission of specimen other than nasopharyngeal swab, presence of viral mutation(s) within the areas targeted by this assay, and inadequate number of viral copies(<138 copies/mL). A negative result must be combined with clinical observations, patient history, and epidemiological information. The expected result is Negative.  Fact Sheet for Patients:  EntrepreneurPulse.com.au  Fact Sheet for Healthcare Providers:  IncredibleEmployment.be  This test is no t yet approved or cleared by the [place] FDA and  has been authorized for detection and/or diagnosis of SARS-CoV-2 by FDA under an Emergency Use Authorization (EUA). This EUA will remain  in effect (meaning this test can be used) for the duration of the COVID-19 declaration under Section 564(b)(1) of the Act, 21 U.S.C.section  360bbb-3(b)(1), unless the authorization is terminated  or revoked sooner.       Influenza A by PCR NEGATIVE NEGATIVE   Influenza B by PCR NEGATIVE NEGATIVE    Comment: (NOTE) The Xpert Xpress SARS-CoV-2/FLU/RSV plus assay is intended as an aid in the diagnosis of influenza from Nasopharyngeal swab specimens and should not be used as a sole basis  for treatment. Nasal washings and aspirates are unacceptable for Xpert Xpress SARS-CoV-2/FLU/RSV testing.  Fact Sheet for Patients: EntrepreneurPulse.com.au  Fact Sheet for Healthcare Providers: IncredibleEmployment.be  This test is not yet approved or cleared by the [place] FDA and has been authorized for detection and/or diagnosis of SARS-CoV-2 by FDA under an Emergency Use Authorization (EUA). This EUA will remain in effect (meaning this test can be used) for the duration of the COVID-19 declaration under Section 564(b)(1) of the Act, 21 U.S.C. section 360bbb-3(b)(1), unless the authorization is terminated or revoked.  Performed at [facility], [place] [address], [place], [place] [address]   Urinalysis, Routine w reflex microscopic Urine, Clean Catch     Status: Abnormal   Collection Time: 03/09/21  9:56 PM  Result Value Ref Range   Color, Urine YELLOW YELLOW   APPearance HAZY (A) CLEAR   Specific Gravity, Urine 1.015 1.005 - 1.030   pH 6.0 5.0 - 8.0   Glucose, UA >=500 (A) NEGATIVE mg/dL   Hgb urine dipstick SMALL (A) NEGATIVE   Bilirubin Urine NEGATIVE NEGATIVE   Ketones, ur NEGATIVE NEGATIVE mg/dL   Protein, ur >=300 (A) NEGATIVE mg/dL   Nitrite NEGATIVE NEGATIVE   Leukocytes,Ua NEGATIVE NEGATIVE   RBC / HPF 0-5 0 - 5 RBC/hpf   WBC, UA 6-10 0 - 5 WBC/hpf   Bacteria, UA MANY (A) NONE SEEN   Squamous Epithelial / LPF 6-10 0 - 5    Comment: Performed at [facility], [address] [place], [place] [address]  I-Stat venous blood gas, [facility] ED)     Status: Abnormal    Collection Time: 03/09/21 10:56 PM  Result Value Ref Range   pH, Ven 7.357 7.250 - 7.430   pCO2, Ven 36.4 (L) 44.0 - 60.0 mmHg   pO2, Ven 57.0 (H) 32.0 - 45.0 mmHg   Bicarbonate 20.5 20.0 - 28.0 mmol/L   TCO2 22 22 - 32 mmol/L   O2 Saturation 88.0 %   Acid-base deficit 5.0 (H) 0.0 - 2.0 mmol/L   Sodium 142 135 - 145 mmol/L   Potassium 4.4 3.5 - 5.1 mmol/L   Calcium, Ion 1.10 (L) 1.15 - 1.40 mmol/L   HCT 24.0 (L) 36.0 - 46.0 %   Hemoglobin 8.2 (L) 12.0 - 15.0 g/dL   Sample type VENOUS   Troponin I (High Sensitivity)     Status: Abnormal   Collection Time: 03/09/21 10:56 PM  Result Value Ref Range   Troponin I (High Sensitivity) 54 (H) <18 ng/L    Comment: (NOTE) Elevated high sensitivity troponin I (hsTnI) values and significant  changes across serial measurements may suggest ACS but many other  chronic and acute conditions are known to elevate hsTnI results.  Refer to the "Links" section for chest pain algorithms and additional  guidance. Performed at [facility], [place] [address] [place], [place] [address]   CBG monitoring, ED     Status: Abnormal   Collection Time: 03/10/21 12:55 AM  Result Value Ref Range   Glucose-Capillary 236 (H) 70 - 99 mg/dL    Comment: Glucose reference range applies only to samples taken after fasting for at least 8 hours.  CBG monitoring, ED     Status: Abnormal   Collection Time: 03/10/21  8:31 AM  Result Value Ref Range   Glucose-Capillary 114 (H) 70 - 99 mg/dL    Comment: Glucose reference range applies only to samples taken after fasting for at least 8 hours.  CBG monitoring, ED     Status: Abnormal   Collection Time: 03/10/21 12:06 PM  Result Value Ref Range   Glucose-Capillary 133 (H) 70 - 99 mg/dL    Comment: Glucose reference range applies only to samples taken after fasting for at least 8 hours.  Glucose, capillary     Status: Abnormal   Collection Time: 03/10/21  1:57 PM  Result Value Ref Range   Glucose-Capillary 155 (H) 70 -  99 mg/dL    Comment: Glucose reference range applies only to samples taken after fasting for at least 8 hours.  Glucose, capillary     Status: Abnormal   Collection Time: 03/10/21  4:05 PM  Result Value Ref Range   Glucose-Capillary 144 (H) 70 - 99 mg/dL    Comment: Glucose reference range applies only to samples taken after fasting for at least 8 hours.   Comment 1 Notify RN    Comment 2 Document in Chart   Glucose, capillary     Status: Abnormal   Collection Time: 03/10/21  9:06 PM  Result Value Ref Range   Glucose-Capillary 100 (H) 70 - 99 mg/dL    Comment: Glucose reference range applies only to samples taken after fasting for at least 8 hours.   Comment 1 Notify RN    Comment 2 Document in Chart   ABO/Rh     Status: None   Collection Time: 03/11/21  3:09 AM  Result Value Ref Range   ABO/RH(D)      O POS Performed at [facility] [address], [place], [place] [address]   Basic metabolic panel     Status: Abnormal   Collection Time: 03/11/21  3:09 AM  Result Value Ref Range   Sodium 139 135 - 145 mmol/L   Potassium 4.4 3.5 - 5.1 mmol/L   Chloride 110 98 - 111 mmol/L   CO2 20 (L) 22 - 32 mmol/L   Glucose, Bld 80 70 - 99 mg/dL    Comment: Glucose reference range applies only to samples taken after fasting for at least 8 hours.   BUN 65 (H) 8 - 23 mg/dL   Creatinine, Ser 5.95 (H) 0.44 - 1.00 mg/dL   Calcium 8.3 (L) 8.9 - 10.3 mg/dL   GFR, Estimated 7 (L) >60 mL/min    Comment: (NOTE) Calculated using the CKD-EPI Creatinine Equation (2021)    Anion gap 9 5 - 15    Comment: Performed at [facility] [address] [place], [name] [address]  Glucose, capillary     Status: None   Collection Time: 03/11/21  6:42 AM  Result Value Ref Range   Glucose-Capillary 82 70 - 99 mg/dL    Comment: Glucose reference range applies only to samples taken after fasting for at least 8 hours.   Comment 1 Notify RN   Glucose, capillary     Status: Abnormal   Collection  Time: 03/11/21 10:40 AM  Result Value Ref Range   Glucose-Capillary 119 (H) 70 - 99 mg/dL    Comment: Glucose reference range applies only to samples taken after fasting for at least 8 hours.   DG Chest 2 View  Result Date: 03/10/2021 CLINICAL DATA:  Hilar enlargement on chest radiograph EXAM: CHEST - 2 VIEW COMPARISON:  03/09/2021 FINDINGS: The heart is enlarged. Pulmonary vascular congestion present. Diffuse bilateral airspace opacities, progressed since prior examination. IMPRESSION: Radiographic findings most consistent with CHF/fluid volume overload, which has progressed since the prior exam. Electronically Signed  By: Sharen Heck  Mir M.D.   On: 03/10/2021 08:37   DG Chest 2 View  Result Date: 03/09/2021 CLINICAL DATA:  Shortness of breath. Decreased hemoglobin level to 7.2, dyspnea on exertion. EXAM: CHEST - 2 VIEW COMPARISON:  January 29, 2017. FINDINGS: Limited evaluation due to patient body habitus. Trachea is midline. Cardiomediastinal contours with cardiac enlargement and central pulmonary vascular engorgement. Fullness of the RIGHT and LEFT hilum may relate to this phenomenon but is not well assessed. No lobar level consolidative changes. Suggestion of LEFT-sided effusion on lateral radiograph and basilar opacity. On limited assessment no acute skeletal process. IMPRESSION: 1. Cardiomegaly and central pulmonary vascular engorgement. Correlate with any signs of heart failure. 2. Indeterminate fullness of the hila bilaterally. This may relate to congestive changes at the level of the bilateral hila not well assessed in AP projection. When the patient is able consider PA and lateral chest radiograph on follow-up to exclude hilar nodal enlargement. 3. Potential LEFT effusion and basilar airspace disease. Correlate with any signs of heart failure or pneumonia. Electronically Signed   By: Zetta Bills M.D.   On: 03/09/2021 15:48     Blood pressure (!) 195/93, pulse 74, temperature (!) 97.4 F (36.3  C), temperature source Oral, resp. rate 18, height 5\' 3"  (1.6 m), weight 99.1 kg, SpO2 100 %. Physical Exam Constitutional:      Appearance: She is obese.  HENT:     Head: Normocephalic.  Cardiovascular:     Rate and Rhythm: Normal rate.     Pulses: Normal pulses.     Heart sounds: Normal heart sounds.  Pulmonary:     Effort: No respiratory distress.     Breath sounds: Rales present.  Abdominal:     General: There is distension.     Palpations: Abdomen is soft.     Tenderness: There is no abdominal tenderness.  Musculoskeletal:        General: Swelling (3+ LE edema bilaterally) present. Normal range of motion.  Skin:    General: Skin is warm and dry.  Neurological:     General: No focal deficit present.     Mental Status: She is alert.      Assessment/Plan 1. AKI on CKD (Stage IV-V): Patient presents with a mild AKI on CKD in the setting of acute HFpEF exacerbation. She has a history of CKD 2/2 poorly controlled diabetes. She was recently referred to VSS for vascular access in preparation for HD in the near future. Unfortunately, the patient did not show for her VSS in March 2022, stating that she did not feel ready to move forward with HD at that time but ultimately knows she will need it in the future. Currently there is no emergent need for HD as she only has a mild acidosis, continues to urinate, and does not have any significant electrolyte, and she is not clinically uremic.  - Strict INO and renal diet and fluid/salt restriction  - Will get phosphorus and magnesium as she will likely phosphate binders - Continue furosemide 60 mg BID - Would recommend VSS consult for vascular access during this hospitalization.   2. HFpEF exacerbation:  3. CAD  - Continue lasix 60 mg daily - Continue to hold ACE/ARB  3. IDDM: - Continue management per primary team  5. Anemia:  - Consider starting oral iron supplementation.    Lawerance Cruel, D.O.  Internal Medicine Resident,  PGY-2 [facility]  Pager: [phone] 1:34 PM, 03/11/2021

## 2021-03-11 NOTE — Consult Note (Addendum)
Cardiology Consultation:   Patient ID: Carolyn Russo MRN: [id number]; DOB: 05/28/1951  Admit date: 03/09/2021 Date of Consult: 03/11/2021  PCP:  [name], MD   [facility] Providers Cardiologist:  Sinclair Grooms, MD   { Click here to update MD or APP on Care Team, Refresh:   Patient Profile:   Carolyn Russo is a 70 y.o. female with a hx of hypertension, hyperlipidemia, DM 2, stage IV/V CKD, CAD and chronic anemia who is being seen 03/11/2021 for the evaluation of CHF exacerbation at the request of Dr. Karleen Hampshire.  History of Present Illness:   Carolyn Russo is a 70 year old female with past medical history of hypertension, hyperlipidemia, DM 2, stage IV/V CKD, CAD and chronic anemia. She is the wife of Dr. Marylynn Pearson, [place].  Previous Myoview obtained on 03/21/2017 showed EF 65%, medium sized moderate intensity reversible anterior anteroseptal, and anterolateral perfusion defect suggestive of ischemia.  Patient subsequently underwent cardiac catheterization on 04/04/2017 which only revealed 30 to 40% heavily calcified proximal LAD, widely patent left circumflex, ramus intermedius and native RCA, EF 60% by LV gram.  It was recommended for the patient to consider outpatient sleep study to rule out obstructive sleep apnea.  She has been followed by Dr. Johnney Ou of [facility].  Creatinine has gradually trended up over the years.  Back in 2019 in 2020, baseline creatinine was 1.8.  Creatinine trended up to 2.8 by June 2021 and eventually went up to 3.1 by 12/21/2019.  Although her nephrologist has previously broached the topic on dialysis in the future, it appears patient has turned down dialysis access procedure in the past.  Over the past one months, she has been having gradually worsening shortness of breath with exertion.  She also developed worsening lower extremity edema bilaterally.  Based on lab work obtained by her PCP, creatinine trended up to 5.05 on 02/17/2021.  Her diuretic  has been adjusted several times.  Last note by PCP suggested she take Lasix 40 mg twice daily 3 times a week.  Since then, she has transition to 40 mg daily of Lasix every other day.  She eventually sought medical attention at [facility] on 03/09/2021 due to worsening dyspnea.  On arrival, systolic blood pressure was over 200.  Creatinine 5.35.  Patient has been placed on Lasix 60 mg twice a day for IV diuresis.  Despite good diuresis, creatinine did trend up to 5.95 on the following day.  Urinalysis shows many bacteria, greater than 500 glucose, greater than 300 protein.  COVID test negative.  Cardiology service has been consulted for acute CHF exacerbation.  Chest x-ray shows evidence of pulmonary edema.    Past Medical History:  Diagnosis Date  . Allergy   . Anterior chest wall pain   . Appendicitis 1965  . Asthma   . Body mass index 37.0-37.9, adult   . Breast pain   . Cataract    both eyes  . Chronic renal disease, stage II   . Dehydration 2014  . Deviated septum 1971  . Diabetes mellitus   . Dyspnea 2014  . Extrinsic asthma    WITH ASTHMA ATTACK  . Fibroid 1980  . GERD (gastroesophageal reflux disease)   . Heart murmur   . Hx gestational diabetes   . Hyperlipidemia   . Hypertension 2014  . Inguinal hernia 1959  . Malaise and fatigue 2014  . Nephropathy   . Non-IgE mediated allergic asthma 2014  . Obesity   .  Pelvic pain   . Pregnancy, high-risk 1985  . Tonsillitis 1968  . Uterine fibroid 1980    Past Surgical History:  Procedure Laterality Date  . APPENDECTOMY  1959  . CESAREAN SECTION  1985  . COLONOSCOPY    . EYE SURGERY     bilateral cataract   . HERNIA REPAIR  1959  . LEFT HEART CATH AND CORONARY ANGIOGRAPHY N/A 04/04/2017   Procedure: Left Heart Cath and Coronary Angiography;  Surgeon: Belva Crome, MD;  Location: [place] [facility];  Service: Cardiovascular;  Laterality: N/A;  . Mark, 2004, 2007  . RHINOPLASTY  1971  . ROTATOR CUFF  REPAIR  2003  . SURGICAL REPAIR OF HEMORRHAGE  2015  . TONSILLECTOMY  1968     Home Medications:  Prior to Admission medications   Medication Sig Start Date End Date Taking? Authorizing Provider  albuterol (PROVENTIL) (2.5 MG/3ML) 0.083% nebulizer solution Take 3 mLs (2.5 mg total) by nebulization every 4 (four) hours as needed for wheezing or shortness of breath. 03/04/21 03/04/22 Yes Ghumman, Ramandeep, NP  albuterol (VENTOLIN HFA) 108 (90 Base) MCG/ACT inhaler Inhale 2 puffs into the lungs every 4 (four) hours as needed for wheezing or shortness of breath. 03/01/21  Yes [name], MD  aspirin 81 MG tablet Take 81 mg by mouth daily.   Yes [provider]  b complex vitamins capsule Take 1 capsule by mouth daily.   Yes [provider]  BESIVANCE 0.6 % SUSP Place 1 drop into both eyes See admin instructions. After  eye injections 10/26/18  Yes [provider]  calcitRIOL (ROCALTROL) 0.25 MCG capsule Take 0.25 mcg by mouth 3 (three) times a week. 06/24/20  Yes [provider]  Caraway Oil-Levomenthol (FDGARD PO) Take 1 tablet by mouth 2 (two) times daily as needed (nausea).   Yes [provider]  Cholecalciferol (VITAMIN D3 PO) Take 2,000 Units by mouth daily.   Yes [provider]  Continuous Blood Gluc Receiver (DEXCOM G6 RECEIVER) DEVI Use as directed to check blood sugars 3 times per day dx: e11.65 04/14/20  Yes [name], MD  Continuous Blood Gluc Sensor (DEXCOM G6 SENSOR) MISC Use as directed to check blood sugars 3 times per day dx: e11.65 04/14/20  Yes [name], MD  Continuous Blood Gluc Transmit (DEXCOM G6 TRANSMITTER) MISC Use as directed to check blood sugars 3 times per day dx: e11.65 04/14/20  Yes [name], MD  fluticasone [facility]) 50 MCG/ACT nasal spray Place 2 sprays into the nose daily as needed for allergies.    Yes [provider]  furosemide (LASIX) 40 MG tablet Take 40-80 mg by mouth See admin  instructions. 80 mg twice a week. 40 mg all other days   Yes [provider]  insulin aspart protamine - aspart (NOVOLOG MIX 70/30 FLEXPEN) (70-30) 100 UNIT/ML FlexPen Inject 0.3 mLs (30 Units total) into the skin 2 (two) times daily with a meal. Patient taking differently: Inject into the skin 2 (two) times daily with a meal. 35 units in the morning and 25 units at night 08/12/20  Yes [name], MD  lactobacillus acidophilus (BACID) TABS Take 1 tablet by mouth daily. PROBIOTIC   Yes [provider]  magnesium oxide (MAG-OX) 400 (241.3 Mg) MG tablet Take 400 mg by mouth daily.   Yes [provider]  OneTouch Delica Lancets 14E MISC Use as directed to check blood sugars before breakfast and dinner dx: e11.65 04/22/20  Yes  [name], MD  [facility] VERIO test strip CHECK BLOOD SUGAR TWICE DAILY AS DIRECTED 02/09/21  Yes [name], MD  polyethylene glycol (MIRALAX / GLYCOLAX) 17 g packet Take 17 g by mouth daily. Patient taking differently: Take 17 g by mouth daily as needed for mild constipation. 03/20/20  Yes [name], MD  Polyvinyl Alcohol-Povidone (REFRESH OP) Place 1 drop into both eyes daily as needed (dry eyes).   Yes [provider]  rosuvastatin (CRESTOR) 20 MG tablet Take 1 tablet (20 mg total) by mouth daily. 11/19/20  Yes Belva Crome, MD  SYMBICORT 160-4.5 MCG/ACT inhaler Inhale 2 puffs into the lungs 2 (two) times daily. 02/26/21  Yes [name], MD  valsartan-hydrochlorothiazide (DIOVAN-HCT) 320-12.5 MG tablet Take 1 tablet by mouth daily. 12/29/20  Yes Belva Crome, MD  vitamin C (ASCORBIC ACID) 500 MG tablet Take 500 mg by mouth daily.   Yes [provider]  mometasone-formoterol (DULERA) 200-5 MCG/ACT AERO Inhale 2 puffs into the lungs 2 (two) times daily. Patient not taking: Reported on 03/09/2021 02/25/21   [name], MD    Inpatient Medications: Scheduled Meds: . aspirin  81 mg Oral Daily  . calcitRIOL  0.25 mcg  Oral Once per day on Mon Wed Fri  . fluticasone  2 spray Each Nare Daily  . furosemide  60 mg Intravenous Q12H  . heparin  5,000 Units Subcutaneous Q8H  . insulin aspart  0-5 Units Subcutaneous QHS  . insulin aspart  0-9 Units Subcutaneous TID WC  . insulin detemir  15 Units Subcutaneous BID  . mometasone-formoterol  2 puff Inhalation BID  . rosuvastatin  10 mg Oral Daily   Continuous Infusions: . sodium chloride     PRN Meds: sodium chloride, acetaminophen, hydrALAZINE, ipratropium-albuterol, ondansetron (ZOFRAN) IV  Allergies:    Allergies  Allergen Reactions  . Food Anaphylaxis    Peanuts; Almonds  . Other Shortness Of Breath    Peanuts; Almonds  . Wheat Bran Shortness Of Breath  . Statins Itching and Other (See Comments)    Generalized aches  . Iodine Other (See Comments)  . Shellfish Allergy Other (See Comments)    Mouth gets raw  . Sitagliptin     Other reaction(s): Unknown  . Tetracycline Other (See Comments)    Raw mouth  . Tetracyclines & Related Itching  . Contrast Media [Iodinated Diagnostic Agents] Rash    Happened during CT scan over 30 years ago    Social History:   Social History   Socioeconomic History  . Marital status: Married    Spouse name: Not on file  . Number of children: 1  . Years of education: Not on file  . Highest education level: Professional school degree (e.g., MD, DDS, DVM, JD)  Occupational History  . Occupation: retired  Tobacco Use  . Smoking status: Never Smoker  . Smokeless tobacco: Never Used  Vaping Use  . Vaping Use: Never used  Substance and Sexual Activity  . Alcohol use: No  . Drug use: No  . Sexual activity: Yes    Birth control/protection: Post-menopausal  Other Topics Concern  . Not on file  Social History Narrative   Patient reports she used to walk at [employer] but now due to Covid-19 she is staying inside.   Social Determinants of Health   Financial Resource Strain: Low Risk   . Difficulty of Paying  Living Expenses: Not hard at all  Food Insecurity: No Food Insecurity  . Worried About Running  Out of Food in the Last Year: Never true  . Ran Out of Food in the Last Year: Never true  Transportation Needs: No Transportation Needs  . Lack of Transportation (Medical): No  . Lack of Transportation (Non-Medical): No  Physical Activity: Sufficiently Active  . Days of Exercise per Week: 4 days  . Minutes of Exercise per Session: 60 min  Stress: Stress Concern Present  . Feeling of Stress : To some extent  Social Connections: Not on file  Intimate Partner Violence: Not on file    Family History:    Family History  Problem Relation Age of Onset  . Cancer Mother        abdominal melamona  . Psoriasis Mother   . Alzheimer's disease Father   . Cancer Cousin        colon   . Diabetes Cousin   . Diabetes Maternal Aunt   . Colon cancer Neg Hx   . Colon polyps Neg Hx   . Esophageal cancer Neg Hx   . Rectal cancer Neg Hx   . Stomach cancer Neg Hx   . Breast cancer Neg Hx      ROS:  Please see the history of present illness.   All other ROS reviewed and negative.     Physical Exam/Data:   Vitals:   03/11/21 0728 03/11/21 0728 03/11/21 1040 03/11/21 1356  BP:  (!) 177/83 (!) 195/93 (!) 177/88  Pulse: 71 74 74 79  Resp: 16 18 18    Temp:  98 F (36.7 C) (!) 97.4 F (36.3 C)   TempSrc:  Oral Oral   SpO2: 99% 98% 100%   Weight:      Height:        Intake/Output Summary (Last 24 hours) at 03/11/2021 1358 Last data filed at 03/11/2021 0703 Gross per 24 hour  Intake 480 ml  Output 1350 ml  Net -870 ml   Last 3 Weights 03/11/2021 03/11/2021 03/10/2021  Weight (lbs) 218 lb 6.4 oz 218 lb 6.4 oz 221 lb 9 oz  Weight (kg) 99.066 kg 99.066 kg 100.5 kg     Body mass index is 38.69 kg/m.  General:  Well nourished, well developed, in no acute distress HEENT: normal Lymph: no adenopathy Neck: no JVD Endocrine:  No thryomegaly Vascular: No carotid bruits; FA pulses 2+ bilaterally  without bruits  Cardiac:  normal S1, S2; RRR; no murmur  Lungs:  clear to auscultation bilaterally, no wheezing, rhonchi or rales  Abd: soft, nontender, no hepatomegaly  Ext: no edema Musculoskeletal:  No deformities, BUE and BLE strength normal and equal Skin: warm and dry  Neuro:  CNs 2-12 intact, no focal abnormalities noted Psych:  Normal affect   EKG:  The EKG was personally reviewed and demonstrates:  NSR with poor R progression in the anterior leads Telemetry:  Telemetry was personally reviewed and demonstrates:  NSR without significant ventricular ectopy  Relevant CV Studies:  Cath 04/04/2017  Heavily calcified eccentric 30 to 40% proximal LAD.  Widely patent circumflex, ramus intermedius, and native right coronary.  Normal left ventricular size and function with EF 60%. Elevated left ventricular end-diastolic pressure consistent with diastolic heart failure.  RECOMMENDATIONS:   Aggressive risk factor modification  Blood pressure control  Weight loss  Consider sleep study to rule out sleep apnea (based upon airway obstruction noted during the procedure after receiving low-dose sedation).    Laboratory Data:  High Sensitivity Troponin:   Recent Labs  Lab 03/09/21 2110 03/09/21 2256  TROPONINIHS 51* 54*     Chemistry Recent Labs  Lab 03/09/21 1435 03/09/21 2256 03/11/21 0309  NA 138 142 139  K 4.5 4.4 4.4  CL 108  --  110  CO2 22  --  20*  GLUCOSE 295*  --  80  BUN 58*  --  65*  CREATININE 5.35*  --  5.95*  CALCIUM 8.8*  --  8.3*  GFRNONAA 8*  --  7*  ANIONGAP 8  --  9    Recent Labs  Lab 03/09/21 1435  PROT 6.3*  ALBUMIN 3.0*  AST 27  ALT 49*  ALKPHOS 96  BILITOT 0.5   Hematology Recent Labs  Lab 03/04/21 1517 03/09/21 1435 03/09/21 2256  WBC  --  9.7  --   RBC  --  2.64*  --   HGB 7.7* 7.4* 8.2*  HCT  --  22.8* 24.0*  MCV  --  86.4  --   MCH  --  28.0  --   MCHC  --  32.5  --   RDW  --  14.7  --   PLT  --  398  --     BNP Recent Labs  Lab 03/09/21 1435  BNP 818.7*    DDimer No results for input(s): DDIMER in the last 168 hours.   Radiology/Studies:  DG Chest 2 View  Result Date: 03/10/2021 CLINICAL DATA:  Hilar enlargement on chest radiograph EXAM: CHEST - 2 VIEW COMPARISON:  03/09/2021 FINDINGS: The heart is enlarged. Pulmonary vascular congestion present. Diffuse bilateral airspace opacities, progressed since prior examination. IMPRESSION: Radiographic findings most consistent with CHF/fluid volume overload, which has progressed since the prior exam. Electronically Signed   By: Miachel Roux M.D.   On: 03/10/2021 08:37   DG Chest 2 View  Result Date: 03/09/2021 CLINICAL DATA:  Shortness of breath. Decreased hemoglobin level to 7.2, dyspnea on exertion. EXAM: CHEST - 2 VIEW COMPARISON:  January 29, 2017. FINDINGS: Limited evaluation due to patient body habitus. Trachea is midline. Cardiomediastinal contours with cardiac enlargement and central pulmonary vascular engorgement. Fullness of the RIGHT and LEFT hilum may relate to this phenomenon but is not well assessed. No lobar level consolidative changes. Suggestion of LEFT-sided effusion on lateral radiograph and basilar opacity. On limited assessment no acute skeletal process. IMPRESSION: 1. Cardiomegaly and central pulmonary vascular engorgement. Correlate with any signs of heart failure. 2. Indeterminate fullness of the hila bilaterally. This may relate to congestive changes at the level of the bilateral hila not well assessed in AP projection. When the patient is able consider PA and lateral chest radiograph on follow-up to exclude hilar nodal enlargement. 3. Potential LEFT effusion and basilar airspace disease. Correlate with any signs of heart failure or pneumonia. Electronically Signed   By: Zetta Bills M.D.   On: 03/09/2021 15:48     Assessment and Plan:   1. Acute diastolic failure  -Occurred in the setting of uncontrolled high blood pressure,  worsening renal function and anemia.  -Patient has at least 2+ pitting edema with pulmonary edema.  Prior to hospitalization, she was taking 40 mg Lasix every other day.  Currently started on 60 mg BID of IV lasix.  Good urine output, creatinine is trending up slightly.  We will continue on the current dose of IV diuresis.  Patient is likely near dialysis level. Nephrology to discuss potential dialysis catheter and AVF procedure with the patient again  2. CAD: Last cardiac catheterization in 2018 showed nonobstructive disease.  3. Hypertension: Blood pressure quite elevated on arrival.  Home losartan-hydrochlorothiazide has been discontinued.  Hydralazine added to her medical regimen.  4. Hyperlipidemia: On Crestor  5. DM2  6. Stage V CKD: Previous creatinine 3.1-year August 2021, more recently, creatinine trended up to 5.05 by 06/19/2021.  She arrived in the hospital with creatinine of 5.35, after diuresis, this trended up to 5.95. She is followed by Dr. Johnney Ou of [facility]  7. Chronic anemia: Hgb 7-8, likely related to CKD   Risk Assessment/Risk Scores:        New York Heart Association (NYHA) Functional Class NYHA Class III        For questions or updates, please contact [facility] Please consult [email or web address] for contact info under    Signed, Almyra Deforest, [place]  03/11/2021 1:58 PM   I have seen and examined the patient along with Almyra Deforest, PA .  I have reviewed the chart, notes and new data.  I agree with PA/NP's note.  Key new complaints: She is still slightly orthopnea, but her breathing is better than yesterday.  She weighs 40 pounds more than she did over the majority of 2021.  She blames this on less activity due to hip bursitis.  I suspect most of that weight gain actually represents fluid. Key examination changes: Elevated JVP to angle of jaw, a few basilar lung rales bilaterally, RRR, no murmurs or gallops are heard, abdominal distention, pitting edema  to the knees bilaterally Key new findings / data: Steadily rising creatinine over the last 24 months.  I believe the most recent creatinine readings actually underestimate the severity of her renal dysfunction since she is "volume diluted".  ECG is benign.  BNP is elevated, as expected. Proteinuria.  Reviewed preliminary echo images. Moderate LVH, LVEF 50-55% (global hypokinesis), pseudonormal mitral inflow, plethoric IVC, increased RA and LA pressures, estimated sPAP approx 60 mm Hg, Ao valve sclerosis without stenosis, no major valve abnormalities.  PLAN: The pulmonary edema is due to massive volume overload in the setting of advanced CKD, leading to HF decompensation on a background of HTN-related diastolic left heart dysfunction. She may have as much as 40 lb of excess fluid on board. Continue diuresis, which will be associated with worsening renal function. I do not think there is any way out of this except with dialysis.  I believe that she understands this, but is still trying to delay the decision, saying she needs to talk it over with her husband. Her son from [place] was on a conference call with [place] in her room and agrees that she needs to start dialysis. At this point, her HTN is heavily driven by volume and will improve with volume removal. Hydralazine a good interim choice.  Sanda Klein, MD, [place] [phone] 03/11/2021, 3:39 PM

## 2021-03-11 NOTE — Progress Notes (Signed)
Received consult for PIV placement. No meds due until AM. Nurse Belenda Cruise RN stated " we can leave it 'til AM." Fran Lowes, RN VAST

## 2021-03-11 NOTE — Progress Notes (Signed)
  Echocardiogram 2D Echocardiogram has been performed with [facility].  Carolyn Russo 03/11/2021, 11:54 AM

## 2021-03-11 NOTE — Progress Notes (Signed)
PROGRESS NOTE    Carolyn Russo  QJF:[id number] DOB: May 01, 1951 DOA: 03/09/2021 PCP: [name], MD    Chief Complaint  Patient presents with  . Low Hemoglobin   . Leg Swelling    Brief Narrative: 70 year old lady prior history of stage IV CKD, insulin-dependent diabetes mellitus, coronary artery disease, hypertension presents to ED for worsening sob, bilateral lower extremity swelling. Chest x-ray with cardiomegaly, vascular congestion, possible left pleural effusion, and indeterminate bilateral hilar fullness.  Chemistry panel features a creatinine of 5.35 and glucose 295.  CBC notable for hemoglobin of 7.4.  High-sensitivity troponin was 51 then 54.  BNP elevated to 819.  Patient was given 80 mg IV Lasix, nitroglycerin, and acetaminophen in the ED.   Assessment & Plan:   Principal Problem:   Acute on chronic diastolic CHF (congestive heart failure) (HCC) Active Problems:   Type 2 diabetes mellitus (HCC)   CAD (coronary artery disease), native coronary artery   Asthma   Anemia in chronic kidney disease   CKD (chronic kidney disease), stage V (HCC)   Hypertensive urgency   Acute on chronic diastolic CHF:  BNP is 893, CXR shows CHF, pedal edema, sob and hypoxia requiring 2lit of [place] oxygen.  Echocardiogram pending.  Improving , on IV lasix 60 gm BID. Diuresed about 2.1 lit since admission.  Continue with strict intake and output/ daily weights.  Creatinine worsened from 5.3 to 5.9. potassium around 4.4.  Cardiology will be consulted.      Stage 4 CKD:  Baseline creatinine around 3, worsened to 5 in 01/2021, saw Dr Johnney Ou, . Presented with a creatinine of 5.35 , worsened to 5.95 with diuresis.  Will request nephrology consultation for recommendations on whether to cut back the dose of lasix to daily.     Insulin dependent DM A1C IS 6.2% CBG (last 3)  Recent Labs    03/10/21 1605 03/10/21 2106 03/11/21 0642  GLUCAP 144* 100* 82   Resume SSI and continue with  Levemir.  No changes in medications   Hypertensive Urgency:  Blood pressure parameters not at goal, added hydralazine 25 mg TID.     CAD:  Continue with aspirin and statin.    Anemia of chronic disease sec to stage 4 CKD.  Transfuse to keep hemoglobin greater than 7.    Asthma;  No wheezing or rhonchi.  Continue with bronchodilators.    Hyperlipidemia:  Resume crestor.    DVT prophylaxis: heparin.  Code Status: (Full code.  Family Communication: none at bedside.  Disposition:   Status is: Inpatient  Remains inpatient appropriate because:Ongoing diagnostic testing needed not appropriate for outpatient work up and IV treatments appropriate due to intensity of illness or inability to take PO   Dispo: The patient is from: Home              Anticipated d/c is to: Home              Patient currently is not medically stable to d/c.   Difficult to place patient No       Consultants:   Cardiology  Nephrology   Procedures: Echocardiogram   Antimicrobials: none.    Subjective: Patient continues to require 2 L of nasal cannula oxygen, She reports her breathing has improved, no chest pain Bilateral pedal edema and leg edema present  Objective: Vitals:   03/11/21 0019 03/11/21 0453 03/11/21 0728 03/11/21 0728  BP: (!) 201/97 (!) 161/85  (!) 177/83  Pulse: 73 83 71 74  Resp: 20 20 16 18   Temp: 97.6 F (36.4 C) 98 F (36.7 C)  98 F (36.7 C)  TempSrc: Oral Oral  Oral  SpO2: 98% 99% 99% 98%  Weight: 99.1 kg     Height:        Intake/Output Summary (Last 24 hours) at 03/11/2021 0949 Last data filed at 03/11/2021 0703 Gross per 24 hour  Intake 480 ml  Output 2550 ml  Net -2070 ml   Filed Weights   03/10/21 1504 03/11/21 0019 03/11/21 0019  Weight: 100.5 kg 99.1 kg 99.1 kg    Examination:  General exam: Elderly lady not in any kind of distress on 2 L of nasal cannula oxygen Respiratory system: Diminished air entry at bases no wheezing or  rhonchi Cardiovascular system: S1-S2 heard, regular rate rhythm, leg edema present Gastrointestinal system: Abdomen is soft, nontender nondistended bowel sounds normal Central nervous system: Alert and oriented grossly nonfocal Extremities: Bilateral tight leg edema present Skin: No rashes seen Psychiatry: Mood is appropriate    Data Reviewed: I have personally reviewed following labs and imaging studies  CBC: Recent Labs  Lab 03/04/21 1517 03/09/21 1435 03/09/21 2256  WBC  --  9.7  --   NEUTROABS  --  6.4  --   HGB 7.7* 7.4* 8.2*  HCT  --  22.8* 24.0*  MCV  --  86.4  --   PLT  --  398  --     Basic Metabolic Panel: Recent Labs  Lab 03/09/21 1435 03/09/21 2256 03/11/21 0309  NA 138 142 139  K 4.5 4.4 4.4  CL 108  --  110  CO2 22  --  20*  GLUCOSE 295*  --  80  BUN 58*  --  65*  CREATININE 5.35*  --  5.95*  CALCIUM 8.8*  --  8.3*    GFR: Estimated Creatinine Clearance: 9.6 mL/min (A) (by C-G formula based on SCr of 5.95 mg/dL (H)).  Liver Function Tests: Recent Labs  Lab 03/09/21 1435  AST 27  ALT 49*  ALKPHOS 96  BILITOT 0.5  PROT 6.3*  ALBUMIN 3.0*    CBG: Recent Labs  Lab 03/10/21 1206 03/10/21 1357 03/10/21 1605 03/10/21 2106 03/11/21 0642  GLUCAP 133* 155* 144* 100* 82     Recent Results (from the past 240 hour(s))  Resp Panel by RT-PCR (Flu A&B, Covid) Nasopharyngeal Swab     Status: None   Collection Time: 03/09/21  9:28 PM   Specimen: Nasopharyngeal Swab; Nasopharyngeal(NP) swabs in vial transport medium  Result Value Ref Range Status   SARS Coronavirus 2 by RT PCR NEGATIVE NEGATIVE Final    Comment: (NOTE) SARS-CoV-2 target nucleic acids are NOT DETECTED.  The SARS-CoV-2 RNA is generally detectable in upper respiratory specimens during the acute phase of infection. The lowest concentration of SARS-CoV-2 viral copies this assay can detect is 138 copies/mL. A negative result does not preclude SARS-Cov-2 infection and should not  be used as the sole basis for treatment or other patient management decisions. A negative result may occur with  improper specimen collection/handling, submission of specimen other than nasopharyngeal swab, presence of viral mutation(s) within the areas targeted by this assay, and inadequate number of viral copies(<138 copies/mL). A negative result must be combined with clinical observations, patient history, and epidemiological information. The expected result is Negative.  Fact Sheet for Patients:  EntrepreneurPulse.com.au  Fact Sheet for Healthcare Providers:  IncredibleEmployment.be  This test is no t yet approved or cleared by  the [employer] and  has been authorized for detection and/or diagnosis of SARS-CoV-2 by FDA under an Emergency Use Authorization (EUA). This EUA will remain  in effect (meaning this test can be used) for the duration of the COVID-19 declaration under Section 564(b)(1) of the Act, 21 U.S.C.section 360bbb-3(b)(1), unless the authorization is terminated  or revoked sooner.       Influenza A by PCR NEGATIVE NEGATIVE Final   Influenza B by PCR NEGATIVE NEGATIVE Final    Comment: (NOTE) The Xpert Xpress SARS-CoV-2/FLU/RSV plus assay is intended as an aid in the diagnosis of influenza from Nasopharyngeal swab specimens and should not be used as a sole basis for treatment. Nasal washings and aspirates are unacceptable for Xpert Xpress SARS-CoV-2/FLU/RSV testing.  Fact Sheet for Patients: EntrepreneurPulse.com.au  Fact Sheet for Healthcare Providers: IncredibleEmployment.be  This test is not yet approved or cleared by the [place] FDA and has been authorized for detection and/or diagnosis of SARS-CoV-2 by FDA under an Emergency Use Authorization (EUA). This EUA will remain in effect (meaning this test can be used) for the duration of the COVID-19 declaration under Section  564(b)(1) of the Act, 21 U.S.C. section 360bbb-3(b)(1), unless the authorization is terminated or revoked.  Performed at [facility], [place] [address] [place], [place] [address]          Radiology Studies: DG Chest 2 View  Result Date: 03/10/2021 CLINICAL DATA:  Hilar enlargement on chest radiograph EXAM: CHEST - 2 VIEW COMPARISON:  03/09/2021 FINDINGS: The heart is enlarged. Pulmonary vascular congestion present. Diffuse bilateral airspace opacities, progressed since prior examination. IMPRESSION: Radiographic findings most consistent with CHF/fluid volume overload, which has progressed since the prior exam. Electronically Signed   By: Miachel Roux M.D.   On: 03/10/2021 08:37   DG Chest 2 View  Result Date: 03/09/2021 CLINICAL DATA:  Shortness of breath. Decreased hemoglobin level to 7.2, dyspnea on exertion. EXAM: CHEST - 2 VIEW COMPARISON:  January 29, 2017. FINDINGS: Limited evaluation due to patient body habitus. Trachea is midline. Cardiomediastinal contours with cardiac enlargement and central pulmonary vascular engorgement. Fullness of the RIGHT and LEFT hilum may relate to this phenomenon but is not well assessed. No lobar level consolidative changes. Suggestion of LEFT-sided effusion on lateral radiograph and basilar opacity. On limited assessment no acute skeletal process. IMPRESSION: 1. Cardiomegaly and central pulmonary vascular engorgement. Correlate with any signs of heart failure. 2. Indeterminate fullness of the hila bilaterally. This may relate to congestive changes at the level of the bilateral hila not well assessed in AP projection. When the patient is able consider PA and lateral chest radiograph on follow-up to exclude hilar nodal enlargement. 3. Potential LEFT effusion and basilar airspace disease. Correlate with any signs of heart failure or pneumonia. Electronically Signed   By: Zetta Bills M.D.   On: 03/09/2021 15:48        Scheduled Meds: . aspirin  81 mg  Oral Daily  . calcitRIOL  0.25 mcg Oral Once per day on Mon Wed Fri  . fluticasone  2 spray Each Nare Daily  . furosemide  60 mg Intravenous Q12H  . heparin  5,000 Units Subcutaneous Q8H  . insulin aspart  0-5 Units Subcutaneous QHS  . insulin aspart  0-9 Units Subcutaneous TID WC  . insulin detemir  15 Units Subcutaneous BID  . mometasone-formoterol  2 puff Inhalation BID  . rosuvastatin  10 mg Oral Daily   Continuous Infusions: . sodium chloride  LOS: 2 days        Hosie Poisson, MD [facility]   To contact the attending provider between 7A-7P or the covering provider during after hours 7P-7A, please log into the web site [email or web address] and access using universal [place] password for that web site. If you do not have the password, please call the hospital operator.  03/11/2021, 9:49 AM

## 2021-03-12 ENCOUNTER — Inpatient Hospital Stay (HOSPITAL_COMMUNITY): Payer: Medicare Other

## 2021-03-12 DIAGNOSIS — N186 End stage renal disease: Secondary | ICD-10-CM

## 2021-03-12 DIAGNOSIS — J810 Acute pulmonary edema: Secondary | ICD-10-CM | POA: Diagnosis not present

## 2021-03-12 DIAGNOSIS — N185 Chronic kidney disease, stage 5: Secondary | ICD-10-CM

## 2021-03-12 DIAGNOSIS — I5033 Acute on chronic diastolic (congestive) heart failure: Secondary | ICD-10-CM | POA: Diagnosis not present

## 2021-03-12 DIAGNOSIS — D631 Anemia in chronic kidney disease: Secondary | ICD-10-CM | POA: Diagnosis not present

## 2021-03-12 LAB — GLUCOSE, CAPILLARY
Glucose-Capillary: 130 mg/dL — ABNORMAL HIGH (ref 70–99)
Glucose-Capillary: 113 mg/dL — ABNORMAL HIGH (ref 70–99)
Glucose-Capillary: 222 mg/dL — ABNORMAL HIGH (ref 70–99)
Glucose-Capillary: 128 mg/dL — ABNORMAL HIGH (ref 70–99)

## 2021-03-12 LAB — FERRITIN: Ferritin: 72 ng/mL (ref 11–307)

## 2021-03-12 LAB — BASIC METABOLIC PANEL
Anion gap: 8 (ref 5–15)
BUN: 69 mg/dL — ABNORMAL HIGH (ref 8–23)
CO2: 23 mmol/L (ref 22–32)
Calcium: 8.1 mg/dL — ABNORMAL LOW (ref 8.9–10.3)
Chloride: 108 mmol/L (ref 98–111)
Creatinine, Ser: 5.67 mg/dL — ABNORMAL HIGH (ref 0.44–1.00)
GFR, Estimated: 7 mL/min — ABNORMAL LOW (ref >60)
Glucose, Bld: 129 mg/dL — ABNORMAL HIGH (ref 70–99)
Potassium: 3.8 mmol/L (ref 3.5–5.1)
Sodium: 139 mmol/L (ref 135–145)

## 2021-03-12 LAB — IRON AND TIBC
Iron: 43 ug/dL (ref 28–170)
Saturation Ratios: 14 % (ref 10.4–31.8)
TIBC: 301 ug/dL (ref 250–450)
UIBC: 258 ug/dL

## 2021-03-12 MED ORDER — CARVEDILOL 6.25 MG PO TABS
6.2500 mg | ORAL_TABLET | Freq: Two times a day (BID) | ORAL | Status: DC
  Administered 2021-03-12 (×2): 6.25 mg via ORAL
  Filled 2021-03-12 (×2): qty 1

## 2021-03-12 MED ORDER — SODIUM CHLORIDE 0.9 % IV SOLN
250.0000 mg | Freq: Every day | INTRAVENOUS | Status: DC
  Administered 2021-03-12 – 2021-03-14 (×3): 250 mg via INTRAVENOUS
  Filled 2021-03-12 (×4): qty 20

## 2021-03-12 NOTE — Progress Notes (Signed)
   03/12/21 1122  Mobility  Activity Refused mobility (Attempted x2 will check back as time permits)

## 2021-03-12 NOTE — Care Management Important Message (Signed)
Important Message  Patient Details  Name: Carolyn Russo MRN: [id number] Date of Birth: 1951/08/26   Medicare Important Message Given:  Yes     [name] 03/12/2021, 8:12 AM

## 2021-03-12 NOTE — Consult Note (Signed)
REASON FOR CONSULT:    For hemodialysis access.  ASSESSMENT & PLAN:    STAGE V CHRONIC KIDNEY DISEASE: This patient has stage V chronic kidney disease.  We have been asked to place hemodialysis access.  Her vein map is pending.  If she has a reasonable vein then we can try to schedule for for an AV fistula next week if she is still in the hospital.  If she is discharged we can arrange this as an outpatient.  If she does not have an adequate vein for a fistula I will have to discuss with nephrology whether or not they want [place] to proceed with placement of an AV graft.  I will follow-up after her vein mapping.  I have ordered a bilateral upper extremity vein map.  Deitra Mayo, MD Office: [phone]   HPI:   Carolyn Russo is a pleasant 70 y.o. female, who was admitted on 03/09/2021 with shortness of breath and leg swelling.  She has a history of chronic kidney disease stage IV/V.  She is followed by Dr. Johnney Ou at [facility].  She has been seen in consultation by nephrology and she is felt to have acute on chronic kidney injury.  She has nonoliguric renal failure and is responding to IV diuretics.  We were consulted to place access while she is in the hospital.  She has no significant uremic symptoms and nephrology feels there is no urgent need for dialysis at this point.  On my history the patient is right-handed.  She does not have a pacemaker.  She does have some fatigue but otherwise no significant uremic symptoms.  Her chronic kidney disease is secondary to diabetes.  She is not a smoker.  She does have some leg swelling and is undergone a venous duplex scan this admission which showed no evidence of DVT.  Past Medical History:  Diagnosis Date  . Allergy   . Anterior chest wall pain   . Appendicitis 1965  . Asthma   . Body mass index 37.0-37.9, adult   . Breast pain   . Cataract    both eyes  . Chronic renal disease, stage II   . Dehydration 2014  .  Deviated septum 1971  . Diabetes mellitus   . Dyspnea 2014  . Extrinsic asthma    WITH ASTHMA ATTACK  . Fibroid 1980  . GERD (gastroesophageal reflux disease)   . Heart murmur   . Hx gestational diabetes   . Hyperlipidemia   . Hypertension 2014  . Inguinal hernia 1959  . Malaise and fatigue 2014  . Nephropathy   . Non-IgE mediated allergic asthma 2014  . Obesity   . Pelvic pain   . Pregnancy, high-risk 1985  . Tonsillitis 1968  . Uterine fibroid 1980    Family History  Problem Relation Age of Onset  . Cancer Mother        abdominal melamona  . Psoriasis Mother   . Alzheimer's disease Father   . Cancer Cousin        colon   . Diabetes Cousin   . Diabetes Maternal Aunt   . Colon cancer Neg Hx   . Colon polyps Neg Hx   . Esophageal cancer Neg Hx   . Rectal cancer Neg Hx   . Stomach cancer Neg Hx   . Breast cancer Neg Hx     SOCIAL HISTORY: Social History   Socioeconomic History  . Marital status: Married    Spouse name: Not  on file  . Number of children: 1  . Years of education: Not on file  . Highest education level: Professional school degree (e.g., MD, DDS, DVM, JD)  Occupational History  . Occupation: retired  Tobacco Use  . Smoking status: Never Smoker  . Smokeless tobacco: Never Used  Vaping Use  . Vaping Use: Never used  Substance and Sexual Activity  . Alcohol use: No  . Drug use: No  . Sexual activity: Yes    Birth control/protection: Post-menopausal  Other Topics Concern  . Not on file  Social History Narrative   Patient reports she used to walk at [employer] but now due to Covid-19 she is staying inside.   Social Determinants of Health   Financial Resource Strain: Low Risk   . Difficulty of Paying Living Expenses: Not hard at all  Food Insecurity: No Food Insecurity  . Worried About Charity fundraiser in the Last Year: Never true  . Ran Out of Food in the Last Year: Never true  Transportation Needs: No Transportation Needs  . Lack of  Transportation (Medical): No  . Lack of Transportation (Non-Medical): No  Physical Activity: Sufficiently Active  . Days of Exercise per Week: 4 days  . Minutes of Exercise per Session: 60 min  Stress: Stress Concern Present  . Feeling of Stress : To some extent  Social Connections: Not on file  Intimate Partner Violence: Not on file    Allergies  Allergen Reactions  . Food Anaphylaxis    Peanuts; Almonds  . Other Shortness Of Breath    Peanuts; Almonds  . Wheat Bran Shortness Of Breath  . Statins Itching and Other (See Comments)    Generalized aches  . Iodine Other (See Comments)  . Shellfish Allergy Other (See Comments)    Mouth gets raw  . Sitagliptin     Other reaction(s): Unknown  . Tetracycline Other (See Comments)    Raw mouth  . Tetracyclines & Related Itching  . Contrast Media [Iodinated Diagnostic Agents] Rash    Happened during CT scan over 30 years ago    Current Facility-Administered Medications  Medication Dose Route Frequency Provider Last Rate Last Admin  . 0.9 %  sodium chloride infusion  250 mL Intravenous PRN Opyd, Ilene Qua, MD      . acetaminophen (TYLENOL) tablet 650 mg  650 mg Oral Q4H PRN Opyd, Ilene Qua, MD      . aspirin chewable tablet 81 mg  81 mg Oral Daily Opyd, Ilene Qua, MD   81 mg at 03/11/21 0949  . calcitRIOL (ROCALTROL) capsule 0.25 mcg  0.25 mcg Oral Once per day on Mon Wed Fri Opyd, Timothy S, MD   0.25 mcg at 03/10/21 1400  . carvedilol (COREG) tablet 6.25 mg  6.25 mg Oral BID WC Reesa Chew, MD      . ferric gluconate (FERRLECIT) 250 mg in sodium chloride 0.9 % 100 mL IVPB  250 mg Intravenous Daily Reesa Chew, MD      . fluticasone (FLONASE) 50 MCG/ACT nasal spray 2 spray  2 spray Each Nare Daily Hosie Poisson, MD   2 spray at 03/11/21 1331  . furosemide (LASIX) injection 60 mg  60 mg Intravenous Q12H Opyd, Ilene Qua, MD   60 mg at 03/11/21 2121  . heparin injection 5,000 Units  5,000 Units Subcutaneous Q8H Vianne Bulls, MD   5,000 Units at 03/12/21 [phone]  . hydrALAZINE (APRESOLINE) tablet 25 mg  25 mg Oral  Q8H Hosie Poisson, MD   25 mg at 03/12/21 0615  . hydrALAZINE (APRESOLINE) tablet 50 mg  50 mg Oral Q6H PRN Vianne Bulls, MD   50 mg at 03/12/21 0203  . insulin aspart (novoLOG) injection 0-5 Units  0-5 Units Subcutaneous QHS Vianne Bulls, MD   2 Units at 03/10/21 0058  . insulin aspart (novoLOG) injection 0-9 Units  0-9 Units Subcutaneous TID WC Opyd, Ilene Qua, MD   1 Units at 03/12/21 0631  . insulin detemir (LEVEMIR) injection 15 Units  15 Units Subcutaneous BID Hosie Poisson, MD   15 Units at 03/11/21 2121  . ipratropium-albuterol (DUONEB) 0.5-2.5 (3) MG/3ML nebulizer solution 3 mL  3 mL Nebulization Q6H PRN Hosie Poisson, MD      . mometasone-formoterol (DULERA) 200-5 MCG/ACT inhaler 2 puff  2 puff Inhalation BID Opyd, Ilene Qua, MD   2 puff at 03/12/21 0800  . ondansetron (ZOFRAN) injection 4 mg  4 mg Intravenous Q6H PRN Opyd, Ilene Qua, MD      . rosuvastatin (CRESTOR) tablet 10 mg  10 mg Oral Daily Opyd, Ilene Qua, MD   10 mg at 03/11/21 6568    REVIEW OF SYSTEMS:  [X]  denotes positive finding, [ ]  denotes negative finding Cardiac  Comments:  Chest pain or chest pressure:    Shortness of breath upon exertion: x   Short of breath when lying flat:    Irregular heart rhythm:        Vascular    Pain in calf, thigh, or hip brought on by ambulation:    Pain in feet at night that wakes you up from your sleep:     Blood clot in your veins:    Leg swelling:  x       Pulmonary    Oxygen at home:    Productive cough:     Wheezing:         Neurologic    Sudden weakness in arms or legs:     Sudden numbness in arms or legs:     Sudden onset of difficulty speaking or slurred speech:    Temporary loss of vision in one eye:     Problems with dizziness:         Gastrointestinal    Blood in stool:     Vomited blood:         Genitourinary    Burning when urinating:     Blood in urine:         Psychiatric    Major depression:         Hematologic    Bleeding problems:    Problems with blood clotting too easily:        Skin    Rashes or ulcers:        Constitutional    Fever or chills:     PHYSICAL EXAM:   Vitals:   03/11/21 2050 03/12/21 0158 03/12/21 0511 03/12/21 0801  BP:  (!) 184/94 (!) 163/84   Pulse:  76    Resp:  16    Temp:  98.7 F (37.1 C)    TempSrc:  Oral    SpO2: 98% 97%  98%  Weight:  97.5 kg    Height:        GENERAL: The patient is a well-nourished female, in no acute distress. The vital signs are documented above. CARDIAC: There is a regular rate and rhythm.  VASCULAR: I do not detect carotid bruits. She has palpable  brachial and radial pulses bilaterally. She has significant bilateral lower extremity swelling.  Because of her swelling I cannot palpate pedal pulses however both feet are warm and well-perfused. PULMONARY: There is good air exchange bilaterally without wheezing or rales. ABDOMEN: Soft and non-tender with normal pitched bowel sounds.  MUSCULOSKELETAL: There are no major deformities or cyanosis. NEUROLOGIC: No focal weakness or paresthesias are detected. SKIN: There are no ulcers or rashes noted. PSYCHIATRIC: The patient has a normal affect.  DATA:    VEIN MAP IS PENDING.  LABS: I reviewed her labs from 03/12/2021.  GFR was 7.  Creatinine 5.67.  Potassium 3.8.  VENOUS DUPLEX: I reviewed the venous duplex scan that was done on 12/30/2020.  This showed no evidence of DVT in either lower extremity.

## 2021-03-12 NOTE — Progress Notes (Addendum)
Progress Note  Patient Name: Carolyn Russo Date of Encounter: 03/12/2021  Primary Cardiologist: Dr. Daneen Schick, MD   Subjective   Doing well. Swelling is somewhat improved, no SOB or chest pain   Inpatient Medications    Scheduled Meds: . aspirin  81 mg Oral Daily  . calcitRIOL  0.25 mcg Oral Once per day on Mon Wed Fri  . carvedilol  6.25 mg Oral BID WC  . fluticasone  2 spray Each Nare Daily  . furosemide  60 mg Intravenous Q12H  . heparin  5,000 Units Subcutaneous Q8H  . hydrALAZINE  25 mg Oral Q8H  . insulin aspart  0-5 Units Subcutaneous QHS  . insulin aspart  0-9 Units Subcutaneous TID WC  . insulin detemir  15 Units Subcutaneous BID  . mometasone-formoterol  2 puff Inhalation BID  . rosuvastatin  10 mg Oral Daily   Continuous Infusions: . sodium chloride    . ferric gluconate (FERRLECIT/NULECIT) IV     PRN Meds: sodium chloride, acetaminophen, hydrALAZINE, ipratropium-albuterol, ondansetron (ZOFRAN) IV   Vital Signs    Vitals:   03/11/21 2050 03/12/21 0158 03/12/21 0511 03/12/21 0801  BP:  (!) 184/94 (!) 163/84   Pulse:  76    Resp:  16    Temp:  98.7 F (37.1 C)    TempSrc:  Oral    SpO2: 98% 97%  98%  Weight:  97.5 kg    Height:        Intake/Output Summary (Last 24 hours) at 03/12/2021 0906 Last data filed at 03/12/2021 0500 Gross per 24 hour  Intake 720 ml  Output 2450 ml  Net -1730 ml   Filed Weights   03/11/21 0019 03/11/21 0019 03/12/21 0158  Weight: 99.1 kg 99.1 kg 97.5 kg    Physical Exam   General: Well developed, well nourished, NAD Neck: Negative for carotid bruits. No JVD Lungs: Clear to ausculation bilaterally. Breathing is unlabored. Cardiovascular: RRR with S1 S2. No murmurs Abdomen: Soft, non-tender, non-distended. No obvious abdominal masses. Extremities: No edema. Radial pulses 2+ bilaterally Neuro: Alert and oriented. No focal deficits. No facial asymmetry. MAE spontaneously. Psych: Responds to questions  appropriately with normal affect.    Labs    Chemistry Recent Labs  Lab 03/09/21 1435 03/09/21 2256 03/11/21 0309 03/12/21 0621  NA 138 142 139 139  K 4.5 4.4 4.4 3.8  CL 108  --  110 108  CO2 22  --  20* 23  GLUCOSE 295*  --  80 129*  BUN 58*  --  65* 69*  CREATININE 5.35*  --  5.95* 5.67*  CALCIUM 8.8*  --  8.3* 8.1*  PROT 6.3*  --   --   --   ALBUMIN 3.0*  --   --   --   AST 27  --   --   --   ALT 49*  --   --   --   ALKPHOS 96  --   --   --   BILITOT 0.5  --   --   --   GFRNONAA 8*  --  7* 7*  ANIONGAP 8  --  9 8     Hematology Recent Labs  Lab 03/09/21 1435 03/09/21 2256 03/11/21 1410  WBC 9.7  --  8.1  RBC 2.64*  --  2.64*  HGB 7.4* 8.2* 7.5*  HCT 22.8* 24.0* 22.8*  MCV 86.4  --  86.4  MCH 28.0  --  28.4  MCHC 32.5  --  32.9  RDW 14.7  --  14.7  PLT 398  --  421*    Cardiac EnzymesNo results for input(s): TROPONINI in the last 168 hours. No results for input(s): TROPIPOC in the last 168 hours.   BNP Recent Labs  Lab 03/09/21 1435  BNP 818.7*     DDimer No results for input(s): DDIMER in the last 168 hours.   Radiology    ECHOCARDIOGRAM COMPLETE  Result Date: 03/11/2021    ECHOCARDIOGRAM REPORT   Patient Name:   Carolyn Russo [facility] Date of Exam: 03/11/2021 Medical Rec #:  [id number]      Height:       63.0 in Accession #:    [id number]     Weight:       218.4 lb Date of Birth:  December 29, 1950      BSA:          2.007 m Patient Age:    70 years       BP:           195/93 mmHg Patient Gender: F              HR:           74 bpm. Exam Location:  Inpatient Procedure: 2D Echo, Cardiac Doppler, Color Doppler and Intracardiac            Opacification Agent Indications:    CHF-Acute Diastolic  History:        Patient has no prior history of Echocardiogram examinations.                 CAD, Signs/Symptoms:Shortness of Breath; Risk Factors:Diabetes,                 Hypertension and Dyslipidemia. CKD.  Sonographer:    Clayton Lefort RDCS (AE) Referring Phys: [id number] [place]  1. Left ventricular ejection fraction, by estimation, is 50%. The left ventricle has low normal function. The left ventricle has no regional wall motion abnormalities. There is mild left ventricular hypertrophy. Left ventricular diastolic parameters are  consistent with Grade II diastolic dysfunction (pseudonormalization). Elevated left ventricular end-diastolic pressure. The E/e' is 43.  2. Right ventricular systolic function is normal. The right ventricular size is normal. There is moderately elevated pulmonary artery systolic pressure. The estimated right ventricular systolic pressure is 02.7 mmHg.  3. Left atrial size was mildly dilated.  4. The mitral valve is grossly normal. Trivial mitral valve regurgitation. No evidence of mitral stenosis.  5. The aortic valve is grossly normal. There is mild calcification of the aortic valve. Aortic valve regurgitation is not visualized. Mild aortic valve sclerosis is present, with no evidence of aortic valve stenosis.  6. The inferior vena cava is dilated in size with >50% respiratory variability, suggesting right atrial pressure of 8 mmHg. FINDINGS  Left Ventricle: Left ventricular ejection fraction, by estimation, is 50%. The left ventricle has low normal function. The left ventricle has no regional wall motion abnormalities. Definity contrast agent was given IV to delineate the left ventricular endocardial borders. The left ventricular internal cavity size was normal in size. There is mild left ventricular hypertrophy. Left ventricular diastolic parameters are consistent with Grade II diastolic dysfunction (pseudonormalization). Elevated left ventricular end-diastolic pressure. The E/e' is 11. Right Ventricle: The right ventricular size is normal. No increase in right ventricular wall thickness. Right ventricular systolic function is normal. There is moderately elevated pulmonary artery systolic pressure. The tricuspid regurgitant  velocity is 3.37 m/s,  and with an assumed right atrial pressure of 8 mmHg, the estimated right ventricular systolic pressure is 16.1 mmHg. Left Atrium: Left atrial size was mildly dilated. Right Atrium: Right atrial size was normal in size. Pericardium: There is no evidence of pericardial effusion. Mitral Valve: The mitral valve is grossly normal. Trivial mitral valve regurgitation. No evidence of mitral valve stenosis. MV peak gradient, 5.8 mmHg. The mean mitral valve gradient is 3.0 mmHg. Tricuspid Valve: The tricuspid valve is normal in structure. Tricuspid valve regurgitation is mild . No evidence of tricuspid stenosis. Aortic Valve: The aortic valve is grossly normal. There is mild calcification of the aortic valve. Aortic valve regurgitation is not visualized. Mild aortic valve sclerosis is present, with no evidence of aortic valve stenosis. Aortic valve mean gradient  measures 5.0 mmHg. Aortic valve peak gradient measures 9.7 mmHg. Aortic valve area, by VTI measures 1.55 cm. Pulmonic Valve: The pulmonic valve was grossly normal. Pulmonic valve regurgitation is trivial. No evidence of pulmonic stenosis. Aorta: The aortic root is normal in size and structure. Venous: The inferior vena cava is dilated in size with greater than 50% respiratory variability, suggesting right atrial pressure of 8 mmHg. IAS/Shunts: No atrial level shunt detected by color flow Doppler.  LEFT VENTRICLE PLAX 2D LVIDd:         4.90 cm  Diastology LVIDs:         3.40 cm  LV e' medial:    3.81 cm/s LV PW:         1.15 cm  LV E/e' medial:  27.3 LV IVS:        1.24 cm  LV e' lateral:   5.66 cm/s LVOT diam:     1.80 cm  LV E/e' lateral: 18.4 LV SV:         48 LV SV Index:   24 LVOT Area:     2.54 cm  RIGHT VENTRICLE             IVC RV Basal diam:  2.80 cm     IVC diam: 2.50 cm RV S prime:     12.30 cm/s TAPSE (M-mode): 1.4 cm LEFT ATRIUM             Index       RIGHT ATRIUM           Index LA diam:        4.20 cm 2.09 cm/m  RA Area:     13.30 cm LA Vol (A2C):    68.0 ml 33.88 ml/m RA Volume:   30.70 ml  15.29 ml/m LA Vol (A4C):   73.4 ml 36.57 ml/m LA Biplane Vol: 72.5 ml 36.12 ml/m  AORTIC VALVE AV Area (Vmax):    1.61 cm AV Area (Vmean):   1.61 cm AV Area (VTI):     1.55 cm AV Vmax:           156.00 cm/s AV Vmean:          107.000 cm/s AV VTI:            0.311 m AV Peak Grad:      9.7 mmHg AV Mean Grad:      5.0 mmHg LVOT Vmax:         98.60 cm/s LVOT Vmean:        67.500 cm/s LVOT VTI:          0.189 m LVOT/AV VTI ratio: 0.61  AORTA Ao Root diam: 2.70  cm Ao Asc diam:  2.80 cm MITRAL VALVE                TRICUSPID VALVE MV Area (PHT): 3.91 cm     TR Peak grad:   45.4 mmHg MV Area VTI:   1.67 cm     TR Vmax:        337.00 cm/s MV Peak grad:  5.8 mmHg MV Mean grad:  3.0 mmHg     SHUNTS MV Vmax:       1.20 m/s     Systemic VTI:  0.19 m MV Vmean:      75.0 cm/s    Systemic Diam: 1.80 cm MV Decel Time: 194 msec MV E velocity: 104.00 cm/s MV A velocity: 113.00 cm/s MV E/A ratio:  0.92 Cherlynn Kaiser MD Electronically signed by Cherlynn Kaiser MD Signature Date/Time: 03/11/2021/3:49:11 PM    Final    Telemetry    03/12/21 NSR - Personally Reviewed  ECG    No new tracing as of 03/12/21 - Personally Reviewed  Cardiac Studies   Cath 04/04/2017   Heavily calcified eccentric 30 to 40% proximal LAD.  Widely patent circumflex, ramus intermedius, and native right coronary.  Normal left ventricular size and function with EF 60%. Elevated left ventricular end-diastolic pressure consistent with diastolic heart failure.  RECOMMENDATIONS:   Aggressive risk factor modification  Blood pressure control  Weight loss  Consider sleep study to rule out sleep apnea (based upon airway obstruction noted during the procedure after receiving low-dose sedation).  Echocardiogram 03/11/21:  1. Left ventricular ejection fraction, by estimation, is 50%. The left  ventricle has low normal function. The left ventricle has no regional wall  motion abnormalities.  There is mild left ventricular hypertrophy. Left  ventricular diastolic parameters are  consistent with Grade II diastolic dysfunction (pseudonormalization).  Elevated left ventricular end-diastolic pressure. The E/e' is 29.  2. Right ventricular systolic function is normal. The right ventricular  size is normal. There is moderately elevated pulmonary artery systolic  pressure. The estimated right ventricular systolic pressure is 81.8 mmHg.  3. Left atrial size was mildly dilated.  4. The mitral valve is grossly normal. Trivial mitral valve  regurgitation. No evidence of mitral stenosis.  5. The aortic valve is grossly normal. There is mild calcification of the  aortic valve. Aortic valve regurgitation is not visualized. Mild aortic  valve sclerosis is present, with no evidence of aortic valve stenosis.  6. The inferior vena cava is dilated in size with >50% respiratory  variability, suggesting right atrial pressure of 8 mmHg.   Patient Profile     70 y.o. female with a hx of hypertension, hyperlipidemia, DM 2, stage IV/V CKD, CAD and chronic anemia who is being seen 03/11/2021 for the evaluation of CHF exacerbation at the request of Dr. Karleen Hampshire.  Assessment & Plan    1. Acute on chronic diastolic CHF: -Echocardiogram 03/11/21 with LVEF at 50% with G2DD, moderately elevated PASP at 53.34mmHg.  -Felt that she will ultimately need HD given progresisve CKD and massive fluid volume overload.  -Nephrology agrees with IV Lasix for now with proposed plan for placement of permanent HD access while hospitalized however she needs to review this with her husband  -Continue IV Lasix 60mg  BID -Weight, 215lb today with an admission weight at 220lb  -I&O, net negative 3.96L  2. CAD: -Last LHC form 2018 with non-obstructive CAD -No anginal symptoms  -Continue ASA, carvedilol   3. HTN: -Felt to be in  the setting of massive fluid volume overload>>to improve with volume removal  -Continue  hydralazine   4. CKD stage IV: -Seen by nephrology -Creatinine on hospital presentation at 5.95>>5.67 today  -Follows with Dr. Johnney Ou OP   5. HLD: -Last LDL, 82 from 10/2019 -Continue statin    Signed, Kathyrn Drown NP-C [facility] Pager: [phone] 03/12/2021, 9:06 AM     For questions or updates, please contact   Please consult [email or web address] for contact info under Cardiology/STEMI.  I have seen and examined the patient along with Kathyrn Drown NP-C.  I have reviewed the chart, notes and new data.  I agree with PA/NP's note.  Key new complaints: denies dyspnea/orthopnea Key examination changes: appears comfortable, still has 4+ lower extremity edema bilaterally, despite 4 liters net diuresis. Weight down 3 kg, still 35-40 lb above suspected dry weight Key new findings / data: creatinine is essentially unchanged since yesterday  PLAN: Continue loop diuretics, watching for signs of uremia. She is still not ready to commit to hemodialysis. If she decides to go home without HD, would probably place on oral furosemide 80 mg twice daily and check frequent BMET, at least weekly.  Sanda Klein, MD, [place] [phone] 03/12/2021, 12:18 PM

## 2021-03-12 NOTE — Progress Notes (Signed)
BUE vein mapping has been completed.  Results can be found under chart review under CV PROC. 03/12/2021 4:01 PM [place] RVT, RDMS

## 2021-03-12 NOTE — Progress Notes (Signed)
PROGRESS NOTE    Carolyn Russo  IDP:[id number] DOB: 05-22-1951 DOA: 03/09/2021 PCP: Carolyn Chard, MD    Chief Complaint  Patient presents with  . Low Hemoglobin   . Leg Swelling    Brief Narrative: 70 year old lady prior history of stage IV CKD, insulin-dependent diabetes mellitus, coronary artery disease, hypertension presents to ED for worsening sob, bilateral lower extremity swelling. Chest x-ray with cardiomegaly, vascular congestion, possible left pleural effusion, and indeterminate bilateral hilar fullness.  Chemistry panel features a creatinine of 5.35 and glucose 295.  CBC notable for hemoglobin of 7.4.  High-sensitivity troponin was 51 then 54.  BNP elevated to 819.  Patient was given 80 mg IV Lasix, nitroglycerin, and acetaminophen in the ED.   Assessment & Plan:   Principal Problem:   Acute on chronic diastolic CHF (congestive heart failure) (HCC) Active Problems:   Type 2 diabetes mellitus (HCC)   CAD (coronary artery disease), native coronary artery   Asthma   Anemia in chronic kidney disease   CKD (chronic kidney disease), stage V (HCC)   Hypertensive urgency   Acute on chronic diastolic CHF:  BNP is 443, CXR shows CHF, pedal edema, sob and hypoxia requiring 2lit of [place] oxygen.  Echocardiogram  Showed Left ventricular ejection fraction, by estimation, is 50%, with low normal function, no regional wall motion abnormalities. There is mild left ventricular hypertrophy and grade 2 diastolic dysfunction.  Improving , on IV lasix 60 gm BID. Diuresed about 3.4 lit since admission.  Continue with strict intake and output/ daily weights.  Creatinine worsened from 5.3 to 5.9 to 5.6 today. . potassium wnl.  She is weaned off oxygen and reports feeling better.        Stage 4 CKD:  Baseline creatinine around 3, worsened to 5 in 01/2021, saw Carolyn Russo, . Presented with a creatinine of 5.35 , worsened to 5.95 with diuresis, stabilized at 5.6. Nephrology consulted and  recommendations given.  She is not uremic, no urgent need for HD.  Vascular surgery consulted for vascular access for HD possibly prior to discharge?    Insulin dependent DM A1C IS 6.2% CBG (last 3)  Recent Labs    03/11/21 2115 03/12/21 0628 03/12/21 1125  GLUCAP 182* 130* 113*   Resume SSI and continue with Levemir. . Well controlled today   Hypertensive Urgency:  Much better BP parameters.  Resume hydralazine 25 mg TID,  COREG 6.25 mg BID.     CAD:  Continue with aspirin and statin. She denies any chest pain.    Anemia of chronic disease sec to stage 4 CKD.  Transfuse to keep hemoglobin greater than 7.  Low iron stores, iron infusion given.    Asthma;  No wheezing or rhonchi.  Continue with bronchodilators.    Hyperlipidemia:  Resume crestor.    DVT prophylaxis: heparin.  Code Status: (Full code.  Family Communication: none at bedside.  Disposition:   Status is: Inpatient  Remains inpatient appropriate because:Ongoing diagnostic testing needed not appropriate for outpatient work up and IV treatments appropriate due to intensity of illness or inability to take PO   Dispo: The patient is from: Home              Anticipated d/c is to: Home              Patient currently is not medically stable to d/c.   Difficult to place patient No       Consultants:   Cardiology  Nephrology  Procedures: Echocardiogram   Antimicrobials: none.    Subjective: Pt denies any chest pain.  No nausea, vomiting or abd pain.  She is weaned off oxygen.  She still has persistent leg edema.   Objective: Vitals:   03/12/21 0158 03/12/21 0511 03/12/21 0801 03/12/21 1123  BP: (!) 184/94 (!) 163/84  139/69  Pulse: 76   72  Resp: 16   18  Temp: 98.7 F (37.1 C)   98.6 F (37 C)  TempSrc: Oral   Oral  SpO2: 97%  98% 97%  Weight: 97.5 kg     Height:        Intake/Output Summary (Last 24 hours) at 03/12/2021 1614 Last data filed at 03/12/2021 1315 Gross  per 24 hour  Intake 720 ml  Output 2450 ml  Net -1730 ml   Filed Weights   03/11/21 0019 03/11/21 0019 03/12/21 0158  Weight: 99.1 kg 99.1 kg 97.5 kg    Examination:  General exam: Elderly woman, appears comfortable. Not in distress.  Respiratory system: Air entry fair, no wheezing heard. Not on oxygen today.  Cardiovascular system: S1S2 heard, RRR, no JVD, 3+ leg edema.  Gastrointestinal system: Abdomen is soft, NT, ND BS+ Central nervous system: alert and oriented, very hard of hearing.  Extremities: bilateral leg edema.  Skin: No rashes seen Psychiatry: Mood is appropriate.     Data Reviewed: I have personally reviewed following labs and imaging studies  CBC: Recent Labs  Lab 03/09/21 1435 03/09/21 2256 03/11/21 1410  WBC 9.7  --  8.1  NEUTROABS 6.4  --   --   HGB 7.4* 8.2* 7.5*  HCT 22.8* 24.0* 22.8*  MCV 86.4  --  86.4  PLT 398  --  421*    Basic Metabolic Panel: Recent Labs  Lab 03/09/21 1435 03/09/21 2256 03/11/21 0309 03/11/21 1410 03/12/21 0621  NA 138 142 139  --  139  K 4.5 4.4 4.4  --  3.8  CL 108  --  110  --  108  CO2 22  --  20*  --  23  GLUCOSE 295*  --  80  --  129*  BUN 58*  --  65*  --  69*  CREATININE 5.35*  --  5.95*  --  5.67*  CALCIUM 8.8*  --  8.3*  --  8.1*  MG  --   --   --  2.0  --   PHOS  --   --   --  5.7*  --     GFR: Estimated Creatinine Clearance: 10 mL/min (A) (by C-G formula based on SCr of 5.67 mg/dL (H)).  Liver Function Tests: Recent Labs  Lab 03/09/21 1435  AST 27  ALT 49*  ALKPHOS 96  BILITOT 0.5  PROT 6.3*  ALBUMIN 3.0*    CBG: Recent Labs  Lab 03/11/21 1040 03/11/21 1635 03/11/21 2115 03/12/21 0628 03/12/21 1125  GLUCAP 119* 162* 182* 130* 113*     Recent Results (from the past 240 hour(s))  Resp Panel by RT-PCR (Flu A&B, Covid) Nasopharyngeal Swab     Status: None   Collection Time: 03/09/21  9:28 PM   Specimen: Nasopharyngeal Swab; Nasopharyngeal(NP) swabs in vial transport medium   Result Value Ref Range Status   SARS Coronavirus 2 by RT PCR NEGATIVE NEGATIVE Final    Comment: (NOTE) SARS-CoV-2 target nucleic acids are NOT DETECTED.  The SARS-CoV-2 RNA is generally detectable in upper respiratory specimens during the acute phase of infection. The lowest  concentration of SARS-CoV-2 viral copies this assay can detect is 138 copies/mL. A negative result does not preclude SARS-Cov-2 infection and should not be used as the sole basis for treatment or other patient management decisions. A negative result may occur with  improper specimen collection/handling, submission of specimen other than nasopharyngeal swab, presence of viral mutation(s) within the areas targeted by this assay, and inadequate number of viral copies(<138 copies/mL). A negative result must be combined with clinical observations, patient history, and epidemiological information. The expected result is Negative.  Fact Sheet for Patients:  EntrepreneurPulse.com.au  Fact Sheet for Healthcare Providers:  IncredibleEmployment.be  This test is no t yet approved or cleared by the [place] FDA and  has been authorized for detection and/or diagnosis of SARS-CoV-2 by FDA under an Emergency Use Authorization (EUA). This EUA will remain  in effect (meaning this test can be used) for the duration of the COVID-19 declaration under Section 564(b)(1) of the Act, 21 U.S.C.section 360bbb-3(b)(1), unless the authorization is terminated  or revoked sooner.       Influenza A by PCR NEGATIVE NEGATIVE Final   Influenza B by PCR NEGATIVE NEGATIVE Final    Comment: (NOTE) The Xpert Xpress SARS-CoV-2/FLU/RSV plus assay is intended as an aid in the diagnosis of influenza from Nasopharyngeal swab specimens and should not be used as a sole basis for treatment. Nasal washings and aspirates are unacceptable for Xpert Xpress SARS-CoV-2/FLU/RSV testing.  Fact Sheet for  Patients: EntrepreneurPulse.com.au  Fact Sheet for Healthcare Providers: IncredibleEmployment.be  This test is not yet approved or cleared by the [place] FDA and has been authorized for detection and/or diagnosis of SARS-CoV-2 by FDA under an Emergency Use Authorization (EUA). This EUA will remain in effect (meaning this test can be used) for the duration of the COVID-19 declaration under Section 564(b)(1) of the Act, 21 U.S.C. section 360bbb-3(b)(1), unless the authorization is terminated or revoked.  Performed at [facility], [place] [address] [place],  [address]          Radiology Studies: ECHOCARDIOGRAM COMPLETE  Result Date: 03/11/2021    ECHOCARDIOGRAM REPORT   Patient Name:   BRYTANI VOTH [facility] Date of Exam: 03/11/2021 Medical Rec #:  [id number]      Height:       63.0 in Accession #:    [id number]     Weight:       218.4 lb Date of Birth:  1951-07-23      BSA:          2.007 m Patient Age:    31 years       BP:           195/93 mmHg Patient Gender: F              HR:           74 bpm. Exam Location:  Inpatient Procedure: 2D Echo, Cardiac Doppler, Color Doppler and Intracardiac            Opacification Agent Indications:    CHF-Acute Diastolic  History:        Patient has no prior history of Echocardiogram examinations.                 CAD, Signs/Symptoms:Shortness of Breath; Risk Factors:Diabetes,                 Hypertension and Dyslipidemia. CKD.  Sonographer:    Clayton Lefort RDCS (AE) Referring Phys: [id number] [place]  1. Left ventricular ejection fraction, by estimation, is 50%. The left ventricle has low normal function. The left ventricle has no regional wall motion abnormalities. There is mild left ventricular hypertrophy. Left ventricular diastolic parameters are  consistent with Grade II diastolic dysfunction (pseudonormalization). Elevated left ventricular end-diastolic pressure. The E/e' is 78.  2. Right  ventricular systolic function is normal. The right ventricular size is normal. There is moderately elevated pulmonary artery systolic pressure. The estimated right ventricular systolic pressure is 74.0 mmHg.  3. Left atrial size was mildly dilated.  4. The mitral valve is grossly normal. Trivial mitral valve regurgitation. No evidence of mitral stenosis.  5. The aortic valve is grossly normal. There is mild calcification of the aortic valve. Aortic valve regurgitation is not visualized. Mild aortic valve sclerosis is present, with no evidence of aortic valve stenosis.  6. The inferior vena cava is dilated in size with >50% respiratory variability, suggesting right atrial pressure of 8 mmHg. FINDINGS  Left Ventricle: Left ventricular ejection fraction, by estimation, is 50%. The left ventricle has low normal function. The left ventricle has no regional wall motion abnormalities. Definity contrast agent was given IV to delineate the left ventricular endocardial borders. The left ventricular internal cavity size was normal in size. There is mild left ventricular hypertrophy. Left ventricular diastolic parameters are consistent with Grade II diastolic dysfunction (pseudonormalization). Elevated left ventricular end-diastolic pressure. The E/e' is 31. Right Ventricle: The right ventricular size is normal. No increase in right ventricular wall thickness. Right ventricular systolic function is normal. There is moderately elevated pulmonary artery systolic pressure. The tricuspid regurgitant velocity is 3.37 m/s, and with an assumed right atrial pressure of 8 mmHg, the estimated right ventricular systolic pressure is 81.4 mmHg. Left Atrium: Left atrial size was mildly dilated. Right Atrium: Right atrial size was normal in size. Pericardium: There is no evidence of pericardial effusion. Mitral Valve: The mitral valve is grossly normal. Trivial mitral valve regurgitation. No evidence of mitral valve stenosis. MV peak  gradient, 5.8 mmHg. The mean mitral valve gradient is 3.0 mmHg. Tricuspid Valve: The tricuspid valve is normal in structure. Tricuspid valve regurgitation is mild . No evidence of tricuspid stenosis. Aortic Valve: The aortic valve is grossly normal. There is mild calcification of the aortic valve. Aortic valve regurgitation is not visualized. Mild aortic valve sclerosis is present, with no evidence of aortic valve stenosis. Aortic valve mean gradient  measures 5.0 mmHg. Aortic valve peak gradient measures 9.7 mmHg. Aortic valve area, by VTI measures 1.55 cm. Pulmonic Valve: The pulmonic valve was grossly normal. Pulmonic valve regurgitation is trivial. No evidence of pulmonic stenosis. Aorta: The aortic root is normal in size and structure. Venous: The inferior vena cava is dilated in size with greater than 50% respiratory variability, suggesting right atrial pressure of 8 mmHg. IAS/Shunts: No atrial level shunt detected by color flow Doppler.  LEFT VENTRICLE PLAX 2D LVIDd:         4.90 cm  Diastology LVIDs:         3.40 cm  LV e' medial:    3.81 cm/s LV PW:         1.15 cm  LV E/e' medial:  27.3 LV IVS:        1.24 cm  LV e' lateral:   5.66 cm/s LVOT diam:     1.80 cm  LV E/e' lateral: 18.4 LV SV:         48 LV SV Index:   24 LVOT  Area:     2.54 cm  RIGHT VENTRICLE             IVC RV Basal diam:  2.80 cm     IVC diam: 2.50 cm RV S prime:     12.30 cm/s TAPSE (M-mode): 1.4 cm LEFT ATRIUM             Index       RIGHT ATRIUM           Index LA diam:        4.20 cm 2.09 cm/m  RA Area:     13.30 cm LA Vol (A2C):   68.0 ml 33.88 ml/m RA Volume:   30.70 ml  15.29 ml/m LA Vol (A4C):   73.4 ml 36.57 ml/m LA Biplane Vol: 72.5 ml 36.12 ml/m  AORTIC VALVE AV Area (Vmax):    1.61 cm AV Area (Vmean):   1.61 cm AV Area (VTI):     1.55 cm AV Vmax:           156.00 cm/s AV Vmean:          107.000 cm/s AV VTI:            0.311 m AV Peak Grad:      9.7 mmHg AV Mean Grad:      5.0 mmHg LVOT Vmax:         98.60 cm/s LVOT  Vmean:        67.500 cm/s LVOT VTI:          0.189 m LVOT/AV VTI ratio: 0.61  AORTA Ao Root diam: 2.70 cm Ao Asc diam:  2.80 cm MITRAL VALVE                TRICUSPID VALVE MV Area (PHT): 3.91 cm     TR Peak grad:   45.4 mmHg MV Area VTI:   1.67 cm     TR Vmax:        337.00 cm/s MV Peak grad:  5.8 mmHg MV Mean grad:  3.0 mmHg     SHUNTS MV Vmax:       1.20 m/s     Systemic VTI:  0.19 m MV Vmean:      75.0 cm/s    Systemic Diam: 1.80 cm MV Decel Time: 194 msec MV E velocity: 104.00 cm/s MV A velocity: 113.00 cm/s MV E/A ratio:  0.92 Cherlynn Kaiser MD Electronically signed by Cherlynn Kaiser MD Signature Date/Time: 03/11/2021/3:49:11 PM    Final    VAS [place] UPPER EXT VEIN MAPPING (PRE-OP AVF)  Result Date: 03/12/2021 UPPER EXTREMITY VEIN MAPPING Patient Name:  MERLINE PERKIN [facility]  Date of Exam:   03/12/2021 Medical Rec #: [id number]       Accession #:    [id number] Date of Birth: 1951-03-18       Patient Gender: F Patient Age:   70Y Exam Location:  [facility] Procedure:      VAS [place] UPPER EXT VEIN MAPPING (PRE-OP AVF) Referring Phys: Allen --------------------------------------------------------------------------------  Indications: Pre-access. Limitations: [place], movement Comparison Study: No previous exam Performing Technologist: Rogelia Rohrer  Examination Guidelines: A complete evaluation includes B-mode imaging, spectral Doppler, color Doppler, and power Doppler as needed of all accessible portions of each vessel. Bilateral testing is considered an integral part of a complete examination. Limited examinations for reoccurring indications may be performed as noted. +-----------------+-------------+----------+--------------+ Right Cephalic   Diameter (cm)Depth (cm)   Findings    +-----------------+-------------+----------+--------------+ Shoulder  0.36        1.10                  +-----------------+-------------+----------+--------------+ Prox upper arm       0.25         1.46                  +-----------------+-------------+----------+--------------+ Mid upper arm        0.21        0.77                  +-----------------+-------------+----------+--------------+ Dist upper arm       0.20        0.40                  +-----------------+-------------+----------+--------------+ Antecubital fossa    0.16        0.56                  +-----------------+-------------+----------+--------------+ Prox forearm         0.11        0.27     branching    +-----------------+-------------+----------+--------------+ Mid forearm                             not visualized +-----------------+-------------+----------+--------------+ Dist forearm         0.06        0.22                  +-----------------+-------------+----------+--------------+ Wrist                                   not visualized +-----------------+-------------+----------+--------------+ +-----------------+-------------+----------+--------------+ Right Basilic    Diameter (cm)Depth (cm)   Findings    +-----------------+-------------+----------+--------------+ Prox upper arm       0.50                              +-----------------+-------------+----------+--------------+ Mid upper arm        0.29                              +-----------------+-------------+----------+--------------+ Dist upper arm       0.35                              +-----------------+-------------+----------+--------------+ Antecubital fossa    0.29                 branching    +-----------------+-------------+----------+--------------+ Prox forearm         0.07                              +-----------------+-------------+----------+--------------+ Mid forearm          0.07                              +-----------------+-------------+----------+--------------+ Distal forearm       0.05                              +-----------------+-------------+----------+--------------+  Wrist  not visualized +-----------------+-------------+----------+--------------+ +-----------------+-------------+----------+---------+ Left Cephalic    Diameter (cm)Depth (cm)Findings  +-----------------+-------------+----------+---------+ Shoulder             0.20        1.09             +-----------------+-------------+----------+---------+ Prox upper arm       0.14        0.52             +-----------------+-------------+----------+---------+ Mid upper arm        0.22        0.50             +-----------------+-------------+----------+---------+ Dist upper arm       0.16        0.36             +-----------------+-------------+----------+---------+ Antecubital fossa    0.16        0.65             +-----------------+-------------+----------+---------+ Prox forearm         0.12        0.19   branching +-----------------+-------------+----------+---------+ Mid forearm          0.09        0.34             +-----------------+-------------+----------+---------+ Dist forearm         0.06        0.24             +-----------------+-------------+----------+---------+ Wrist                0.05        0.28             +-----------------+-------------+----------+---------+ +-----------------+-------------+----------+--------------+ Left Basilic     Diameter (cm)Depth (cm)   Findings    +-----------------+-------------+----------+--------------+ Prox upper arm       0.42                              +-----------------+-------------+----------+--------------+ Mid upper arm        0.25                              +-----------------+-------------+----------+--------------+ Dist upper arm       0.22                              +-----------------+-------------+----------+--------------+ Antecubital fossa    0.13                 branching    +-----------------+-------------+----------+--------------+ Prox forearm          0.15                              +-----------------+-------------+----------+--------------+ Mid forearm          0.08                              +-----------------+-------------+----------+--------------+ Distal forearm       0.09                              +-----------------+-------------+----------+--------------+ Wrist  not visualized +-----------------+-------------+----------+--------------+ *See table(s) above for measurements and observations.  Diagnosing physician:    Preliminary         Scheduled Meds: . aspirin  81 mg Oral Daily  . calcitRIOL  0.25 mcg Oral Once per day on Mon Wed Fri  . carvedilol  6.25 mg Oral BID WC  . fluticasone  2 spray Each Nare Daily  . furosemide  60 mg Intravenous Q12H  . heparin  5,000 Units Subcutaneous Q8H  . hydrALAZINE  25 mg Oral Q8H  . insulin aspart  0-5 Units Subcutaneous QHS  . insulin aspart  0-9 Units Subcutaneous TID WC  . insulin detemir  15 Units Subcutaneous BID  . mometasone-formoterol  2 puff Inhalation BID  . rosuvastatin  10 mg Oral Daily   Continuous Infusions: . sodium chloride    . ferric gluconate (FERRLECIT/NULECIT) IV 250 mg (03/12/21 1129)     LOS: 3 days        Hosie Poisson, MD [facility]   To contact the attending provider between 7A-7P or the covering provider during after hours 7P-7A, please log into the web site [email or web address] and access using universal [place] password for that web site. If you do not have the password, please call the hospital operator.  03/12/2021, 4:14 PM

## 2021-03-12 NOTE — Progress Notes (Signed)
[facility] Progress Note    Assessment/ Plan:   1. AKI on CKD (Stage IV-V): Overall patient's kidney function is stable from yesterday. She had a suboptimals response to diuretics in the last 24 hours with only 1.3 liters out. Although her [facility] has improved, she remains fluid overloaded on exam. Although she does not have any emergent need for RRT today, she may need it in the near future is lasix response wanes. Regardless, she would benefit from establishing permanent vascular access during this hospitalization.  - Strict INO and renal diet and fluid/salt restriction  - Continue lasix 60 mg BID - VSS consulted today and appreciate their assistance.   2. HFpEF exacerbation:  3. CAD  4. HLD: Patient remains significantly volume overloaded with only 1.3 L out in the last 24 hours. She is currently receiving lasix 60 mg IV BID. She has lost 2.9 kg since admission. - Continue lasix 60 mg daily per cardiology  - Continue to hold ACE/ARB  4. HTN: Patient's BP remains elevated at 163/84 on carvedilol, hydralazine, and furosemide in the setting of significant fluid overload.  - Cont Carvedilol 6.25 mg po BID, furosemide 60 mg IV BID, hydralazine 25 mg po q 8hrs  - Will likely need to increase her furosemide for diuretic purposes and hydralazine for better control of BP.  3. IDDM: - Continue management per primary team  5. Anemia:  - Continue oral iron supplmentation.   Subjective:   Patient sitting at bedside and does not appear to be in distress. She states that she has been urinating all night and that her breathing has improved. She continues to have significant edema but otherwise denies any new complaints.    Objective:   BP (!) 163/84   Pulse 76   Temp 98.7 F (37.1 C) (Oral)   Resp 16   Ht 5\' 3"  (1.6 m)   Wt 97.5 kg   SpO2 98%   BMI 38.09 kg/m   Intake/Output Summary (Last 24 hours) at 03/12/2021 0820 Last data filed at 03/12/2021 0500 Gross per 24  hour  Intake 720 ml  Output 2450 ml  Net -1730 ml   Weight change: -2.977 kg  Physical Exam: Physical Exam Constitutional:      Appearance: Normal appearance.  HENT:     Head: Normocephalic and atraumatic.  Cardiovascular:     Rate and Rhythm: Normal rate and regular rhythm.     Pulses: Normal pulses.  Pulmonary:     Effort: Pulmonary effort is normal.     Breath sounds: Rales (bibasilar) present.  Abdominal:     General: There is distension.     Palpations: Abdomen is soft.     Tenderness: There is no abdominal tenderness.  Musculoskeletal:        General: Swelling (2+ LE extremity edema bilaterally) present. Normal range of motion.     Cervical back: Normal range of motion.  Skin:    General: Skin is warm and dry.  Neurological:     General: No focal deficit present.     Mental Status: She is alert and oriented to person, place, and time.  Psychiatric:        Mood and Affect: Mood normal.     Imaging: ECHOCARDIOGRAM COMPLETE  Result Date: 03/11/2021    ECHOCARDIOGRAM REPORT   Patient Name:   [facility]yn [facility] Date of Exam: 03/11/2021 Medical Rec #:  [id number]      Height:       63.0 in  Accession #:    [id number]     Weight:       218.4 lb Date of Birth:  26-Dec-1950      BSA:          2.007 m Patient Age:    70 years       BP:           195/93 mmHg Patient Gender: F              HR:           74 bpm. Exam Location:  Inpatient Procedure: 2D Echo, Cardiac Doppler, Color Doppler and Intracardiac            Opacification Agent Indications:    CHF-Acute Diastolic  History:        Patient has no prior history of Echocardiogram examinations.                 CAD, Signs/Symptoms:Shortness of Breath; Risk Factors:Diabetes,                 Hypertension and Dyslipidemia. CKD.  Sonographer:    Clayton Lefort RDCS (AE) Referring Phys: [id number] [place]  1. Left ventricular ejection fraction, by estimation, is 50%. The left ventricle has low normal function. The left ventricle has  no regional wall motion abnormalities. There is mild left ventricular hypertrophy. Left ventricular diastolic parameters are  consistent with Grade II diastolic dysfunction (pseudonormalization). Elevated left ventricular end-diastolic pressure. The E/e' is 85.  2. Right ventricular systolic function is normal. The right ventricular size is normal. There is moderately elevated pulmonary artery systolic pressure. The estimated right ventricular systolic pressure is 38.9 mmHg.  3. Left atrial size was mildly dilated.  4. The mitral valve is grossly normal. Trivial mitral valve regurgitation. No evidence of mitral stenosis.  5. The aortic valve is grossly normal. There is mild calcification of the aortic valve. Aortic valve regurgitation is not visualized. Mild aortic valve sclerosis is present, with no evidence of aortic valve stenosis.  6. The inferior vena cava is dilated in size with >50% respiratory variability, suggesting right atrial pressure of 8 mmHg. FINDINGS  Left Ventricle: Left ventricular ejection fraction, by estimation, is 50%. The left ventricle has low normal function. The left ventricle has no regional wall motion abnormalities. Definity contrast agent was given IV to delineate the left ventricular endocardial borders. The left ventricular internal cavity size was normal in size. There is mild left ventricular hypertrophy. Left ventricular diastolic parameters are consistent with Grade II diastolic dysfunction (pseudonormalization). Elevated left ventricular end-diastolic pressure. The E/e' is 64. Right Ventricle: The right ventricular size is normal. No increase in right ventricular wall thickness. Right ventricular systolic function is normal. There is moderately elevated pulmonary artery systolic pressure. The tricuspid regurgitant velocity is 3.37 m/s, and with an assumed right atrial pressure of 8 mmHg, the estimated right ventricular systolic pressure is 37.3 mmHg. Left Atrium: Left atrial size  was mildly dilated. Right Atrium: Right atrial size was normal in size. Pericardium: There is no evidence of pericardial effusion. Mitral Valve: The mitral valve is grossly normal. Trivial mitral valve regurgitation. No evidence of mitral valve stenosis. MV peak gradient, 5.8 mmHg. The mean mitral valve gradient is 3.0 mmHg. Tricuspid Valve: The tricuspid valve is normal in structure. Tricuspid valve regurgitation is mild . No evidence of tricuspid stenosis. Aortic Valve: The aortic valve is grossly normal. There is mild calcification of the aortic valve. Aortic valve regurgitation is  not visualized. Mild aortic valve sclerosis is present, with no evidence of aortic valve stenosis. Aortic valve mean gradient  measures 5.0 mmHg. Aortic valve peak gradient measures 9.7 mmHg. Aortic valve area, by VTI measures 1.55 cm. Pulmonic Valve: The pulmonic valve was grossly normal. Pulmonic valve regurgitation is trivial. No evidence of pulmonic stenosis. Aorta: The aortic root is normal in size and structure. Venous: The inferior vena cava is dilated in size with greater than 50% respiratory variability, suggesting right atrial pressure of 8 mmHg. IAS/Shunts: No atrial level shunt detected by color flow Doppler.  LEFT VENTRICLE PLAX 2D LVIDd:         4.90 cm  Diastology LVIDs:         3.40 cm  LV e' medial:    3.81 cm/s LV PW:         1.15 cm  LV E/e' medial:  27.3 LV IVS:        1.24 cm  LV e' lateral:   5.66 cm/s LVOT diam:     1.80 cm  LV E/e' lateral: 18.4 LV SV:         48 LV SV Index:   24 LVOT Area:     2.54 cm  RIGHT VENTRICLE             IVC RV Basal diam:  2.80 cm     IVC diam: 2.50 cm RV S prime:     12.30 cm/s TAPSE (M-mode): 1.4 cm LEFT ATRIUM             Index       RIGHT ATRIUM           Index LA diam:        4.20 cm 2.09 cm/m  RA Area:     13.30 cm LA Vol (A2C):   68.0 ml 33.88 ml/m RA Volume:   30.70 ml  15.29 ml/m LA Vol (A4C):   73.4 ml 36.57 ml/m LA Biplane Vol: 72.5 ml 36.12 ml/m  AORTIC VALVE AV  Area (Vmax):    1.61 cm AV Area (Vmean):   1.61 cm AV Area (VTI):     1.55 cm AV Vmax:           156.00 cm/s AV Vmean:          107.000 cm/s AV VTI:            0.311 m AV Peak Grad:      9.7 mmHg AV Mean Grad:      5.0 mmHg LVOT Vmax:         98.60 cm/s LVOT Vmean:        67.500 cm/s LVOT VTI:          0.189 m LVOT/AV VTI ratio: 0.61  AORTA Ao Root diam: 2.70 cm Ao Asc diam:  2.80 cm MITRAL VALVE                TRICUSPID VALVE MV Area (PHT): 3.91 cm     TR Peak grad:   45.4 mmHg MV Area VTI:   1.67 cm     TR Vmax:        337.00 cm/s MV Peak grad:  5.8 mmHg MV Mean grad:  3.0 mmHg     SHUNTS MV Vmax:       1.20 m/s     Systemic VTI:  0.19 m MV Vmean:      75.0 cm/s    Systemic Diam: 1.80 cm MV Decel Time: 194  msec MV E velocity: 104.00 cm/s MV A velocity: 113.00 cm/s MV E/A ratio:  0.92 Cherlynn Kaiser MD Electronically signed by Cherlynn Kaiser MD Signature Date/Time: 03/11/2021/3:49:11 PM    Final     Labs: BMET Recent Labs  Lab 03/09/21 1435 03/09/21 2256 03/11/21 0309 03/11/21 1410 03/12/21 0621  NA 138 142 139  --  139  K 4.5 4.4 4.4  --  3.8  CL 108  --  110  --  108  CO2 22  --  20*  --  23  GLUCOSE 295*  --  80  --  129*  BUN 58*  --  65*  --  69*  CREATININE 5.35*  --  5.95*  --  5.67*  CALCIUM 8.8*  --  8.3*  --  8.1*  PHOS  --   --   --  5.7*  --    CBC Recent Labs  Lab 03/09/21 1435 03/09/21 2256 03/11/21 1410  WBC 9.7  --  8.1  NEUTROABS 6.4  --   --   HGB 7.4* 8.2* 7.5*  HCT 22.8* 24.0* 22.8*  MCV 86.4  --  86.4  PLT 398  --  421*    Medications:    . aspirin  81 mg Oral Daily  . calcitRIOL  0.25 mcg Oral Once per day on Mon Wed Fri  . carvedilol  6.25 mg Oral BID WC  . fluticasone  2 spray Each Nare Daily  . furosemide  60 mg Intravenous Q12H  . heparin  5,000 Units Subcutaneous Q8H  . hydrALAZINE  25 mg Oral Q8H  . insulin aspart  0-5 Units Subcutaneous QHS  . insulin aspart  0-9 Units Subcutaneous TID WC  . insulin detemir  15 Units Subcutaneous BID   . mometasone-formoterol  2 puff Inhalation BID  . rosuvastatin  10 mg Oral Daily    Lawerance Cruel, D.O.  Internal Medicine Resident, PGY-2 [facility]  Pager: [phone] 8:20 AM, 03/12/2021

## 2021-03-13 DIAGNOSIS — I5033 Acute on chronic diastolic (congestive) heart failure: Secondary | ICD-10-CM | POA: Diagnosis not present

## 2021-03-13 DIAGNOSIS — N185 Chronic kidney disease, stage 5: Secondary | ICD-10-CM | POA: Diagnosis not present

## 2021-03-13 DIAGNOSIS — D631 Anemia in chronic kidney disease: Secondary | ICD-10-CM | POA: Diagnosis not present

## 2021-03-13 DIAGNOSIS — J810 Acute pulmonary edema: Secondary | ICD-10-CM | POA: Diagnosis not present

## 2021-03-13 LAB — GLUCOSE, CAPILLARY
Glucose-Capillary: 86 mg/dL (ref 70–99)
Glucose-Capillary: 173 mg/dL — ABNORMAL HIGH (ref 70–99)
Glucose-Capillary: 114 mg/dL — ABNORMAL HIGH (ref 70–99)
Glucose-Capillary: 96 mg/dL (ref 70–99)

## 2021-03-13 LAB — BASIC METABOLIC PANEL
Anion gap: 11 (ref 5–15)
BUN: 72 mg/dL — ABNORMAL HIGH (ref 8–23)
CO2: 21 mmol/L — ABNORMAL LOW (ref 22–32)
Calcium: 8.1 mg/dL — ABNORMAL LOW (ref 8.9–10.3)
Chloride: 108 mmol/L (ref 98–111)
Creatinine, Ser: 5.53 mg/dL — ABNORMAL HIGH (ref 0.44–1.00)
GFR, Estimated: 8 mL/min — ABNORMAL LOW (ref >60)
Glucose, Bld: 95 mg/dL (ref 70–99)
Potassium: 4.1 mmol/L (ref 3.5–5.1)
Sodium: 140 mmol/L (ref 135–145)

## 2021-03-13 MED ORDER — CARVEDILOL 12.5 MG PO TABS
12.5000 mg | ORAL_TABLET | Freq: Two times a day (BID) | ORAL | Status: DC
  Administered 2021-03-13 – 2021-03-17 (×8): 12.5 mg via ORAL
  Filled 2021-03-13 (×8): qty 1

## 2021-03-13 MED ORDER — FUROSEMIDE 80 MG PO TABS
80.0000 mg | ORAL_TABLET | Freq: Two times a day (BID) | ORAL | Status: DC
  Administered 2021-03-13 – 2021-03-16 (×7): 80 mg via ORAL
  Filled 2021-03-13 (×7): qty 1

## 2021-03-13 NOTE — Plan of Care (Signed)
  Problem: Education: Goal: Knowledge of General Education information will improve Description: Including pain rating scale, medication(s)/side effects and non-pharmacologic comfort measures Outcome: Progressing   Problem: Clinical Measurements: Goal: Ability to maintain clinical measurements within normal limits will improve Outcome: Progressing Goal: Respiratory complications will improve Outcome: Progressing   

## 2021-03-13 NOTE — Progress Notes (Signed)
[facility] Progress Note    Assessment/ Plan:   1. AKI on CKD (Stage IV-V): Kidney function stable to slightly improved.  Fairly good urine output with essentially stable weight.  Minimal AKI at this time and mostly just progressive CKD.    - Strict INO and renal diet and fluid/salt restriction  - Switch Lasix to oral  80 mg twice daily - VSS has seen the patient and planning for access creation next week if the patient is willing; patient seems likely to proceed with surgery but unclear whether she will get AVF and right arm (dominant) versus graft on the left.  We encouraged her to strongly consider AVF creation  2. HFpEF exacerbation:  3. CAD  4. HLD: Volume status somewhat improved - lasix adjusted as above - Continue to hold ACE/ARB  4. HTN:  - Inc coreg to 12.5mg  BID and inc lasix above -no other changes to BP meds at this time  3. IDDM: - Continue management per primary team  5. Anemia:  - Receiving IV iron  Subjective:   Patient sitting at bedside and does not appear to be in distress. She states that she has been urinating all night and that her breathing has improved. She continues to have significant edema but otherwise denies any new complaints.    Objective:   BP (!) 158/81 (BP Location: Left Arm)   Pulse 64   Temp 97.6 F (36.4 C) (Oral)   Resp 20   Ht 5\' 3"  (1.6 m)   Wt 97.5 kg   SpO2 100%   BMI 38.07 kg/m   Intake/Output Summary (Last 24 hours) at 03/13/2021 4315 Last data filed at 03/13/2021 0827 Gross per 24 hour  Intake 1240.76 ml  Output 1950 ml  Net -709.24 ml   Weight change: -0.045 kg  Physical Exam: GEN: wdwn, sitting in bed, nad ENT: no nasal discharge, mmm EYES: no scleral icterus, eomi CV: normal rate, no murmurs PULM: no iwob, bilateral chest rise ABD: NABS, non-distended SKIN: no rashes or jaundice EXT: 1+ edema in the bilateral lower extremities, warm and well perfused   Imaging: ECHOCARDIOGRAM  COMPLETE  Result Date: 03/11/2021    ECHOCARDIOGRAM REPORT   Patient Name:   Carolyn Russo [facility] Date of Exam: 03/11/2021 Medical Rec #:  [id number]      Height:       63.0 in Accession #:    [id number]     Weight:       218.4 lb Date of Birth:  1951-02-19      BSA:          2.007 m Patient Age:    70 years       BP:           195/93 mmHg Patient Gender: F              HR:           74 bpm. Exam Location:  Inpatient Procedure: 2D Echo, Cardiac Doppler, Color Doppler and Intracardiac            Opacification Agent Indications:    CHF-Acute Diastolic  History:        Patient has no prior history of Echocardiogram examinations.                 CAD, Signs/Symptoms:Shortness of Breath; Risk Factors:Diabetes,                 Hypertension and Dyslipidemia. CKD.  Sonographer:  Clayton Lefort RDCS (AE) Referring Phys: [id number] [place]  1. Left ventricular ejection fraction, by estimation, is 50%. The left ventricle has low normal function. The left ventricle has no regional wall motion abnormalities. There is mild left ventricular hypertrophy. Left ventricular diastolic parameters are  consistent with Grade II diastolic dysfunction (pseudonormalization). Elevated left ventricular end-diastolic pressure. The E/e' is 66.  2. Right ventricular systolic function is normal. The right ventricular size is normal. There is moderately elevated pulmonary artery systolic pressure. The estimated right ventricular systolic pressure is 14.7 mmHg.  3. Left atrial size was mildly dilated.  4. The mitral valve is grossly normal. Trivial mitral valve regurgitation. No evidence of mitral stenosis.  5. The aortic valve is grossly normal. There is mild calcification of the aortic valve. Aortic valve regurgitation is not visualized. Mild aortic valve sclerosis is present, with no evidence of aortic valve stenosis.  6. The inferior vena cava is dilated in size with >50% respiratory variability, suggesting right atrial pressure of 8  mmHg. FINDINGS  Left Ventricle: Left ventricular ejection fraction, by estimation, is 50%. The left ventricle has low normal function. The left ventricle has no regional wall motion abnormalities. Definity contrast agent was given IV to delineate the left ventricular endocardial borders. The left ventricular internal cavity size was normal in size. There is mild left ventricular hypertrophy. Left ventricular diastolic parameters are consistent with Grade II diastolic dysfunction (pseudonormalization). Elevated left ventricular end-diastolic pressure. The E/e' is 34. Right Ventricle: The right ventricular size is normal. No increase in right ventricular wall thickness. Right ventricular systolic function is normal. There is moderately elevated pulmonary artery systolic pressure. The tricuspid regurgitant velocity is 3.37 m/s, and with an assumed right atrial pressure of 8 mmHg, the estimated right ventricular systolic pressure is 82.9 mmHg. Left Atrium: Left atrial size was mildly dilated. Right Atrium: Right atrial size was normal in size. Pericardium: There is no evidence of pericardial effusion. Mitral Valve: The mitral valve is grossly normal. Trivial mitral valve regurgitation. No evidence of mitral valve stenosis. MV peak gradient, 5.8 mmHg. The mean mitral valve gradient is 3.0 mmHg. Tricuspid Valve: The tricuspid valve is normal in structure. Tricuspid valve regurgitation is mild . No evidence of tricuspid stenosis. Aortic Valve: The aortic valve is grossly normal. There is mild calcification of the aortic valve. Aortic valve regurgitation is not visualized. Mild aortic valve sclerosis is present, with no evidence of aortic valve stenosis. Aortic valve mean gradient  measures 5.0 mmHg. Aortic valve peak gradient measures 9.7 mmHg. Aortic valve area, by VTI measures 1.55 cm. Pulmonic Valve: The pulmonic valve was grossly normal. Pulmonic valve regurgitation is trivial. No evidence of pulmonic stenosis. Aorta:  The aortic root is normal in size and structure. Venous: The inferior vena cava is dilated in size with greater than 50% respiratory variability, suggesting right atrial pressure of 8 mmHg. IAS/Shunts: No atrial level shunt detected by color flow Doppler.  LEFT VENTRICLE PLAX 2D LVIDd:         4.90 cm  Diastology LVIDs:         3.40 cm  LV e' medial:    3.81 cm/s LV PW:         1.15 cm  LV E/e' medial:  27.3 LV IVS:        1.24 cm  LV e' lateral:   5.66 cm/s LVOT diam:     1.80 cm  LV E/e' lateral: 18.4 LV SV:  48 LV SV Index:   24 LVOT Area:     2.54 cm  RIGHT VENTRICLE             IVC RV Basal diam:  2.80 cm     IVC diam: 2.50 cm RV S prime:     12.30 cm/s TAPSE (M-mode): 1.4 cm LEFT ATRIUM             Index       RIGHT ATRIUM           Index LA diam:        4.20 cm 2.09 cm/m  RA Area:     13.30 cm LA Vol (A2C):   68.0 ml 33.88 ml/m RA Volume:   30.70 ml  15.29 ml/m LA Vol (A4C):   73.4 ml 36.57 ml/m LA Biplane Vol: 72.5 ml 36.12 ml/m  AORTIC VALVE AV Area (Vmax):    1.61 cm AV Area (Vmean):   1.61 cm AV Area (VTI):     1.55 cm AV Vmax:           156.00 cm/s AV Vmean:          107.000 cm/s AV VTI:            0.311 m AV Peak Grad:      9.7 mmHg AV Mean Grad:      5.0 mmHg LVOT Vmax:         98.60 cm/s LVOT Vmean:        67.500 cm/s LVOT VTI:          0.189 m LVOT/AV VTI ratio: 0.61  AORTA Ao Root diam: 2.70 cm Ao Asc diam:  2.80 cm MITRAL VALVE                TRICUSPID VALVE MV Area (PHT): 3.91 cm     TR Peak grad:   45.4 mmHg MV Area VTI:   1.67 cm     TR Vmax:        337.00 cm/s MV Peak grad:  5.8 mmHg MV Mean grad:  3.0 mmHg     SHUNTS MV Vmax:       1.20 m/s     Systemic VTI:  0.19 m MV Vmean:      75.0 cm/s    Systemic Diam: 1.80 cm MV Decel Time: 194 msec MV E velocity: 104.00 cm/s MV A velocity: 113.00 cm/s MV E/A ratio:  0.92 Cherlynn Kaiser MD Electronically signed by Cherlynn Kaiser MD Signature Date/Time: 03/11/2021/3:49:11 PM    Final    VAS [place] UPPER EXT VEIN MAPPING (PRE-OP  AVF)  Result Date: 03/12/2021 UPPER EXTREMITY VEIN MAPPING Patient Name:  Carolyn Russo [facility]  Date of Exam:   03/12/2021 Medical Rec #: [id number]       Accession #:    [id number] Date of Birth: 1951-10-04       Patient Gender: F Patient Age:   70Y Exam Location:  [facility] Procedure:      VAS [place] UPPER EXT VEIN MAPPING (PRE-OP AVF) Referring Phys: [place] --------------------------------------------------------------------------------  Indications: Pre-access. Limitations: [place], movement Comparison Study: No previous exam Performing Technologist: Rogelia Rohrer  Examination Guidelines: A complete evaluation includes B-mode imaging, spectral Doppler, color Doppler, and power Doppler as needed of all accessible portions of each vessel. Bilateral testing is considered an integral part of a complete examination. Limited examinations for reoccurring indications may be performed as noted. +-----------------+-------------+----------+--------------+ Right Cephalic   Diameter (cm)Depth (cm)  Findings    +-----------------+-------------+----------+--------------+ Shoulder             0.36        1.10                  +-----------------+-------------+----------+--------------+ Prox upper arm       0.25        1.46                  +-----------------+-------------+----------+--------------+ Mid upper arm        0.21        0.77                  +-----------------+-------------+----------+--------------+ Dist upper arm       0.20        0.40                  +-----------------+-------------+----------+--------------+ Antecubital fossa    0.16        0.56                  +-----------------+-------------+----------+--------------+ Prox forearm         0.11        0.27     branching    +-----------------+-------------+----------+--------------+ Mid forearm                             not visualized +-----------------+-------------+----------+--------------+ Dist  forearm         0.06        0.22                  +-----------------+-------------+----------+--------------+ Wrist                                   not visualized +-----------------+-------------+----------+--------------+ +-----------------+-------------+----------+--------------+ Right Basilic    Diameter (cm)Depth (cm)   Findings    +-----------------+-------------+----------+--------------+ Prox upper arm       0.50                              +-----------------+-------------+----------+--------------+ Mid upper arm        0.29                              +-----------------+-------------+----------+--------------+ Dist upper arm       0.35                              +-----------------+-------------+----------+--------------+ Antecubital fossa    0.29                 branching    +-----------------+-------------+----------+--------------+ Prox forearm         0.07                              +-----------------+-------------+----------+--------------+ Mid forearm          0.07                              +-----------------+-------------+----------+--------------+ Distal forearm       0.05                              +-----------------+-------------+----------+--------------+  Wrist                                   not visualized +-----------------+-------------+----------+--------------+ +-----------------+-------------+----------+---------+ Left Cephalic    Diameter (cm)Depth (cm)Findings  +-----------------+-------------+----------+---------+ Shoulder             0.20        1.09             +-----------------+-------------+----------+---------+ Prox upper arm       0.14        0.52             +-----------------+-------------+----------+---------+ Mid upper arm        0.22        0.50             +-----------------+-------------+----------+---------+ Dist upper arm       0.16        0.36              +-----------------+-------------+----------+---------+ Antecubital fossa    0.16        0.65             +-----------------+-------------+----------+---------+ Prox forearm         0.12        0.19   branching +-----------------+-------------+----------+---------+ Mid forearm          0.09        0.34             +-----------------+-------------+----------+---------+ Dist forearm         0.06        0.24             +-----------------+-------------+----------+---------+ Wrist                0.05        0.28             +-----------------+-------------+----------+---------+ +-----------------+-------------+----------+--------------+ Left Basilic     Diameter (cm)Depth (cm)   Findings    +-----------------+-------------+----------+--------------+ Prox upper arm       0.42                              +-----------------+-------------+----------+--------------+ Mid upper arm        0.25                              +-----------------+-------------+----------+--------------+ Dist upper arm       0.22                              +-----------------+-------------+----------+--------------+ Antecubital fossa    0.13                 branching    +-----------------+-------------+----------+--------------+ Prox forearm         0.15                              +-----------------+-------------+----------+--------------+ Mid forearm          0.08                              +-----------------+-------------+----------+--------------+ Distal forearm       0.09                              +-----------------+-------------+----------+--------------+  Wrist                                   not visualized +-----------------+-------------+----------+--------------+ *See table(s) above for measurements and observations.  Diagnosing physician: Deitra Mayo MD Electronically signed by Deitra Mayo MD on 03/12/2021 at 8:15:20 PM.    Final      Labs: BMET Recent Labs  Lab 03/09/21 1435 03/09/21 2256 03/11/21 0309 03/11/21 1410 03/12/21 0621 03/13/21 0245  NA 138 142 139  --  139 140  K 4.5 4.4 4.4  --  3.8 4.1  CL 108  --  110  --  108 108  CO2 22  --  20*  --  23 21*  GLUCOSE 295*  --  80  --  129* 95  BUN 58*  --  65*  --  69* 72*  CREATININE 5.35*  --  5.95*  --  5.67* 5.53*  CALCIUM 8.8*  --  8.3*  --  8.1* 8.1*  PHOS  --   --   --  5.7*  --   --    CBC Recent Labs  Lab 03/09/21 1435 03/09/21 2256 03/11/21 1410  WBC 9.7  --  8.1  NEUTROABS 6.4  --   --   HGB 7.4* 8.2* 7.5*  HCT 22.8* 24.0* 22.8*  MCV 86.4  --  86.4  PLT 398  --  421*    Medications:    . aspirin  81 mg Oral Daily  . calcitRIOL  0.25 mcg Oral Once per day on Mon Wed Fri  . carvedilol  12.5 mg Oral BID WC  . fluticasone  2 spray Each Nare Daily  . furosemide  80 mg Oral BID  . heparin  5,000 Units Subcutaneous Q8H  . hydrALAZINE  25 mg Oral Q8H  . insulin aspart  0-5 Units Subcutaneous QHS  . insulin aspart  0-9 Units Subcutaneous TID WC  . insulin detemir  15 Units Subcutaneous BID  . mometasone-formoterol  2 puff Inhalation BID  . rosuvastatin  10 mg Oral Daily     8:53 AM, 03/13/2021

## 2021-03-13 NOTE — Progress Notes (Signed)
PROGRESS NOTE    Carolyn Russo  HLK:[id number] DOB: 1951/01/24 DOA: 03/09/2021 PCP: [name], MD    Chief Complaint  Patient presents with  . Low Hemoglobin   . Leg Swelling    Brief Narrative: 70 year old lady prior history of stage IV CKD, insulin-dependent diabetes mellitus, coronary artery disease, hypertension presents to ED for worsening sob, bilateral lower extremity swelling. Chest x-ray with cardiomegaly, vascular congestion, possible left pleural effusion, and indeterminate bilateral hilar fullness.  Chemistry panel features a creatinine of 5.35 and glucose 295.  CBC notable for hemoglobin of 7.4.  High-sensitivity troponin was 51 then 54.  BNP elevated to 819.  Patient was given 80 mg IV Lasix, nitroglycerin, and acetaminophen in the ED. She was admitted for acute on chronic diastolic CHF.   Assessment & Plan:   Principal Problem:   Acute on chronic diastolic CHF (congestive heart failure) (HCC) Active Problems:   Type 2 diabetes mellitus (HCC)   CAD (coronary artery disease), native coronary artery   Asthma   Anemia in chronic kidney disease   CKD (chronic kidney disease), stage V (HCC)   Hypertensive urgency   Acute on chronic diastolic CHF:  BNP is 734, CXR shows CHF, pedal edema, sob and hypoxia requiring 2lit of [place] oxygen on admission. Echocardiogram  Showed Left ventricular ejection fraction, by estimation, is 50%, with low normal function, no regional wall motion abnormalities. There is mild left ventricular hypertrophy and grade 2 diastolic dysfunction.  Improving , on IV lasix 60 gm BID transitioned to lasix 80 mg BID. Marland Kitchen Diuresed about 4.6 lit since admission.  Continue with strict intake and output/ daily weights.  Creatinine worsened from 5.3 to 5.9 to 5.6 today. . potassium wnl.  She is weaned off oxygen and reports feeling better.     Stage 4 CKD:  Baseline creatinine around 3, worsened to 5 in 01/2021, saw Dr Johnney Ou, . Presented with a  creatinine of 5.35 , worsened to 5.95  - 5.6-5.5 Nephrology consulted and recommendations given.  She is not uremic, no urgent need for HD.  Vascular surgery consulted for vascular access for HD possibly prior to discharge? currently waiting for the patient to let us know of their decision.  Vein mapping done.    Insulin dependent DM A1C IS 6.2% CBG (last 3)  Recent Labs    03/12/21 2101 03/13/21 0615 03/13/21 1058  GLUCAP 128* 86 173*   Resume SSI and continue with Levemir. No changes in meds.    Hypertensive Urgency:  Resolved.  BP parameters are optimal.  Resume hydralazine 25 mg TID,  COREG 6.25 mg BID.     CAD:  Continue with aspirin and statin. She denies any chest pain.    Anemia of chronic disease sec to stage 4 CKD.  Transfuse to keep hemoglobin greater than 7.  Low iron stores, iron infusion given.  Recheck hemoglobin in am.    Asthma;  No wheezing or rhonchi.  Continue with bronchodilators.    Hyperlipidemia:  Resume crestor.    DVT prophylaxis: heparin.  Code Status: (Full code.  Family Communication: none at bedside.  Disposition:   Status is: Inpatient  Remains inpatient appropriate because:Ongoing diagnostic testing needed not appropriate for outpatient work up and IV treatments appropriate due to intensity of illness or inability to take PO   Dispo: The patient is from: Home              Anticipated d/c is to: Home  Patient currently is not medically stable to d/c.   Difficult to place patient No       Consultants:   Cardiology  Nephrology   Procedures: Echocardiogram   Antimicrobials: none.    Subjective:   Objective: Vitals:   03/13/21 0450 03/13/21 0729 03/13/21 0744 03/13/21 1257  BP: (!) 152/90  (!) 158/81 119/77  Pulse: 73  64 (!) 59  Resp: 19  20 20   Temp: 97.7 F (36.5 C)  97.6 F (36.4 C) 97.6 F (36.4 C)  TempSrc: Oral  Oral   SpO2: 98% 98% 100% 100%  Weight:      Height:         Intake/Output Summary (Last 24 hours) at 03/13/2021 1533 Last data filed at 03/13/2021 0827 Gross per 24 hour  Intake 760.76 ml  Output 1950 ml  Net -1189.24 ml   Filed Weights   03/11/21 0019 03/12/21 0158 03/13/21 0025  Weight: 99.1 kg 97.5 kg 97.5 kg    Examination:  General exam: ELDERLY woman, not in any distress.  Respiratory system: diminished air entry at bases, no wheezing or rhonchi.  Cardiovascular system: S1S2 heard, RRR, no JVD, pedal edema present.  Gastrointestinal system: Abdomen is soft, non tender non distended bowel sounds heard.  Central nervous system: alert and oriented,  Extremities: bilateral leg edema improving.  Skin: No rashes seen Psychiatry: Mood is appropriate.      Data Reviewed: I have personally reviewed following labs and imaging studies  CBC: Recent Labs  Lab 03/09/21 1435 03/09/21 2256 03/11/21 1410  WBC 9.7  --  8.1  NEUTROABS 6.4  --   --   HGB 7.4* 8.2* 7.5*  HCT 22.8* 24.0* 22.8*  MCV 86.4  --  86.4  PLT 398  --  421*    Basic Metabolic Panel: Recent Labs  Lab 03/09/21 1435 03/09/21 2256 03/11/21 0309 03/11/21 1410 03/12/21 0621 03/13/21 0245  NA 138 142 139  --  139 140  K 4.5 4.4 4.4  --  3.8 4.1  CL 108  --  110  --  108 108  CO2 22  --  20*  --  23 21*  GLUCOSE 295*  --  80  --  129* 95  BUN 58*  --  65*  --  69* 72*  CREATININE 5.35*  --  5.95*  --  5.67* 5.53*  CALCIUM 8.8*  --  8.3*  --  8.1* 8.1*  MG  --   --   --  2.0  --   --   PHOS  --   --   --  5.7*  --   --     GFR: Estimated Creatinine Clearance: 10.2 mL/min (A) (by C-G formula based on SCr of 5.53 mg/dL (H)).  Liver Function Tests: Recent Labs  Lab 03/09/21 1435  AST 27  ALT 49*  ALKPHOS 96  BILITOT 0.5  PROT 6.3*  ALBUMIN 3.0*    CBG: Recent Labs  Lab 03/12/21 1125 03/12/21 1623 03/12/21 2101 03/13/21 0615 03/13/21 1058  GLUCAP 113* 222* 128* 86 173*     Recent Results (from the past 240 hour(s))  Resp Panel by RT-PCR  (Flu A&B, Covid) Nasopharyngeal Swab     Status: None   Collection Time: 03/09/21  9:28 PM   Specimen: Nasopharyngeal Swab; Nasopharyngeal(NP) swabs in vial transport medium  Result Value Ref Range Status   SARS Coronavirus 2 by RT PCR NEGATIVE NEGATIVE Final    Comment: (NOTE) SARS-CoV-2 target  nucleic acids are NOT DETECTED.  The SARS-CoV-2 RNA is generally detectable in upper respiratory specimens during the acute phase of infection. The lowest concentration of SARS-CoV-2 viral copies this assay can detect is 138 copies/mL. A negative result does not preclude SARS-Cov-2 infection and should not be used as the sole basis for treatment or other patient management decisions. A negative result may occur with  improper specimen collection/handling, submission of specimen other than nasopharyngeal swab, presence of viral mutation(s) within the areas targeted by this assay, and inadequate number of viral copies(<138 copies/mL). A negative result must be combined with clinical observations, patient history, and epidemiological information. The expected result is Negative.  Fact Sheet for Patients:  EntrepreneurPulse.com.au  Fact Sheet for Healthcare Providers:  IncredibleEmployment.be  This test is no t yet approved or cleared by the [place] FDA and  has been authorized for detection and/or diagnosis of SARS-CoV-2 by FDA under an Emergency Use Authorization (EUA). This EUA will remain  in effect (meaning this test can be used) for the duration of the COVID-19 declaration under Section 564(b)(1) of the Act, 21 U.S.C.section 360bbb-3(b)(1), unless the authorization is terminated  or revoked sooner.       Influenza A by PCR NEGATIVE NEGATIVE Final   Influenza B by PCR NEGATIVE NEGATIVE Final    Comment: (NOTE) The Xpert Xpress SARS-CoV-2/FLU/RSV plus assay is intended as an aid in the diagnosis of influenza from Nasopharyngeal swab specimens  and should not be used as a sole basis for treatment. Nasal washings and aspirates are unacceptable for Xpert Xpress SARS-CoV-2/FLU/RSV testing.  Fact Sheet for Patients: EntrepreneurPulse.com.au  Fact Sheet for Healthcare Providers: IncredibleEmployment.be  This test is not yet approved or cleared by the [place] FDA and has been authorized for detection and/or diagnosis of SARS-CoV-2 by FDA under an Emergency Use Authorization (EUA). This EUA will remain in effect (meaning this test can be used) for the duration of the COVID-19 declaration under Section 564(b)(1) of the Act, 21 U.S.C. section 360bbb-3(b)(1), unless the authorization is terminated or revoked.  Performed at [facility], [place] [address] [place], [place] [address]          Radiology Studies: VAS [place] UPPER EXT VEIN MAPPING (PRE-OP AVF)  Result Date: 03/12/2021 UPPER EXTREMITY VEIN MAPPING Patient Name:  Carolyn Russo [facility]  Date of Exam:   03/12/2021 Medical Rec #: [id number]       Accession #:    [id number] Date of Birth: 10-27-1950       Patient Gender: F Patient Age:   70Y Exam Location:  [facility] Procedure:      VAS [place] UPPER EXT VEIN MAPPING (PRE-OP AVF) Referring Phys: [place] --------------------------------------------------------------------------------  Indications: Pre-access. Limitations: [place], movement Comparison Study: No previous exam Performing Technologist: Rogelia Rohrer  Examination Guidelines: A complete evaluation includes B-mode imaging, spectral Doppler, color Doppler, and power Doppler as needed of all accessible portions of each vessel. Bilateral testing is considered an integral part of a complete examination. Limited examinations for reoccurring indications may be performed as noted. +-----------------+-------------+----------+--------------+ Right Cephalic   Diameter (cm)Depth (cm)   Findings     +-----------------+-------------+----------+--------------+ Shoulder             0.36        1.10                  +-----------------+-------------+----------+--------------+ Prox upper arm       0.25  1.46                  +-----------------+-------------+----------+--------------+ Mid upper arm        0.21        0.77                  +-----------------+-------------+----------+--------------+ Dist upper arm       0.20        0.40                  +-----------------+-------------+----------+--------------+ Antecubital fossa    0.16        0.56                  +-----------------+-------------+----------+--------------+ Prox forearm         0.11        0.27     branching    +-----------------+-------------+----------+--------------+ Mid forearm                             not visualized +-----------------+-------------+----------+--------------+ Dist forearm         0.06        0.22                  +-----------------+-------------+----------+--------------+ Wrist                                   not visualized +-----------------+-------------+----------+--------------+ +-----------------+-------------+----------+--------------+ Right Basilic    Diameter (cm)Depth (cm)   Findings    +-----------------+-------------+----------+--------------+ Prox upper arm       0.50                              +-----------------+-------------+----------+--------------+ Mid upper arm        0.29                              +-----------------+-------------+----------+--------------+ Dist upper arm       0.35                              +-----------------+-------------+----------+--------------+ Antecubital fossa    0.29                 branching    +-----------------+-------------+----------+--------------+ Prox forearm         0.07                              +-----------------+-------------+----------+--------------+ Mid forearm          0.07                               +-----------------+-------------+----------+--------------+ Distal forearm       0.05                              +-----------------+-------------+----------+--------------+ Wrist                                   not visualized +-----------------+-------------+----------+--------------+ +-----------------+-------------+----------+---------+ Left Cephalic    Diameter (cm)Depth (cm)Findings  +-----------------+-------------+----------+---------+ Shoulder  0.20        1.09             +-----------------+-------------+----------+---------+ Prox upper arm       0.14        0.52             +-----------------+-------------+----------+---------+ Mid upper arm        0.22        0.50             +-----------------+-------------+----------+---------+ Dist upper arm       0.16        0.36             +-----------------+-------------+----------+---------+ Antecubital fossa    0.16        0.65             +-----------------+-------------+----------+---------+ Prox forearm         0.12        0.19   branching +-----------------+-------------+----------+---------+ Mid forearm          0.09        0.34             +-----------------+-------------+----------+---------+ Dist forearm         0.06        0.24             +-----------------+-------------+----------+---------+ Wrist                0.05        0.28             +-----------------+-------------+----------+---------+ +-----------------+-------------+----------+--------------+ Left Basilic     Diameter (cm)Depth (cm)   Findings    +-----------------+-------------+----------+--------------+ Prox upper arm       0.42                              +-----------------+-------------+----------+--------------+ Mid upper arm        0.25                              +-----------------+-------------+----------+--------------+ Dist upper arm       0.22                               +-----------------+-------------+----------+--------------+ Antecubital fossa    0.13                 branching    +-----------------+-------------+----------+--------------+ Prox forearm         0.15                              +-----------------+-------------+----------+--------------+ Mid forearm          0.08                              +-----------------+-------------+----------+--------------+ Distal forearm       0.09                              +-----------------+-------------+----------+--------------+ Wrist                                   not visualized +-----------------+-------------+----------+--------------+ *See table(s) above for measurements  and observations.  Diagnosing physician: Deitra Mayo MD Electronically signed by Deitra Mayo MD on 03/12/2021 at 8:15:20 PM.    Final         Scheduled Meds: . aspirin  81 mg Oral Daily  . calcitRIOL  0.25 mcg Oral Once per day on Mon Wed Fri  . carvedilol  12.5 mg Oral BID WC  . fluticasone  2 spray Each Nare Daily  . furosemide  80 mg Oral BID  . heparin  5,000 Units Subcutaneous Q8H  . hydrALAZINE  25 mg Oral Q8H  . insulin aspart  0-5 Units Subcutaneous QHS  . insulin aspart  0-9 Units Subcutaneous TID WC  . insulin detemir  15 Units Subcutaneous BID  . mometasone-formoterol  2 puff Inhalation BID  . rosuvastatin  10 mg Oral Daily   Continuous Infusions: . sodium chloride    . ferric gluconate (FERRLECIT/NULECIT) IV 250 mg (03/13/21 1207)     LOS: 4 days        Hosie Poisson, MD [facility]   To contact the attending provider between 7A-7P or the covering provider during after hours 7P-7A, please log into the web site [email or web address] and access using universal [place] password for that web site. If you do not have the password, please call the hospital operator.  03/13/2021, 3:33 PM

## 2021-03-13 NOTE — Progress Notes (Signed)
   VASCULAR SURGERY ASSESSMENT & PLAN:   STAGE V CHRONIC KIDNEY DISEASE: I have reviewed her vein map.  Although she is right-handed she does not appear to have any usable veins on the left for an AV fistula.  It looks like her best option for a fistula would be a basilic vein transposition on right.  The vein is somewhat marginal in size and we would likely do this in 2 stages.  I discussed the case with nephrology this morning.  They would prefer [place] not to place a graft if at all possible.  She is still considering this.  If she is ready to proceed with surgery I could potentially schedule her for a for stage right basilic vein transposition on Tuesday.  I will have the IV in her right arm moved to the left.  ANEMIA: Her hemoglobin is 7.5.  If she is agreeable to surgery she may benefit from transfusion preoperatively.  SUBJECTIVE:   No complaints this morning.  PHYSICAL EXAM:   Vitals:   03/13/21 0025 03/13/21 0450 03/13/21 0729 03/13/21 0744  BP:  (!) 152/90  (!) 158/81  Pulse:  73  64  Resp:  19  20  Temp:  97.7 F (36.5 C)  97.6 F (36.4 C)  TempSrc:  Oral  Oral  SpO2:  98% 98% 100%  Weight: 97.5 kg     Height:       Palpable right radial pulse.  LABS:   Lab Results  Component Value Date   WBC 8.1 03/11/2021   HGB 7.5 (L) 03/11/2021   HCT 22.8 (L) 03/11/2021   MCV 86.4 03/11/2021   PLT 421 (H) 03/11/2021   Lab Results  Component Value Date   CREATININE 5.53 (H) 03/13/2021    CBG (last 3)  Recent Labs    03/12/21 1623 03/12/21 2101 03/13/21 0615  GLUCAP 222* 128* 86    PROBLEM LIST:    Principal Problem:   Acute on chronic diastolic CHF (congestive heart failure) (HCC) Active Problems:   Type 2 diabetes mellitus (HCC)   CAD (coronary artery disease), native coronary artery   Asthma   Anemia in chronic kidney disease   CKD (chronic kidney disease), stage V ([place])   Hypertensive urgency   CURRENT MEDS:   . aspirin  81 mg Oral Daily  . calcitRIOL   0.25 mcg Oral Once per day on Mon Wed Fri  . carvedilol  12.5 mg Oral BID WC  . fluticasone  2 spray Each Nare Daily  . furosemide  80 mg Oral BID  . heparin  5,000 Units Subcutaneous Q8H  . hydrALAZINE  25 mg Oral Q8H  . insulin aspart  0-5 Units Subcutaneous QHS  . insulin aspart  0-9 Units Subcutaneous TID WC  . insulin detemir  15 Units Subcutaneous BID  . mometasone-formoterol  2 puff Inhalation BID  . rosuvastatin  10 mg Oral Daily    Deitra Mayo Office: [phone] 03/13/2021

## 2021-03-13 NOTE — Progress Notes (Signed)
      Dr Croitoru's note reviewed. Admitted with acute on chronic diastolic HF. 02/9922 echo: LVEF 50%, grade II dd, normal RVPASP 53. iuresis has been limited by renal dysfunction and her CKD IV. We have deferred diuretic dosing to nephrology. Progressing toward HD, vascular on board assessing access needs. Nothing to add from cardiology standpoint, we will sign off inpatient care.   [facility] will sign off.   Medication Recommendations:  Diuretics per nephrology Other recommendations (labs, testing, etc):  n/a Follow up as an outpatient:  We will arrange for 4 weeks   For questions or updates, please contact [place] Please consult [email or web address] for contact info under        Signed, Carlyle Dolly, MD  03/13/2021, 8:20 AM

## 2021-03-14 DIAGNOSIS — D631 Anemia in chronic kidney disease: Secondary | ICD-10-CM | POA: Diagnosis not present

## 2021-03-14 DIAGNOSIS — N185 Chronic kidney disease, stage 5: Secondary | ICD-10-CM | POA: Diagnosis not present

## 2021-03-14 DIAGNOSIS — J810 Acute pulmonary edema: Secondary | ICD-10-CM | POA: Diagnosis not present

## 2021-03-14 DIAGNOSIS — I5033 Acute on chronic diastolic (congestive) heart failure: Secondary | ICD-10-CM | POA: Diagnosis not present

## 2021-03-14 LAB — CBC WITH DIFFERENTIAL/PLATELET
Abs Immature Granulocytes: 0.09 10*3/uL — ABNORMAL HIGH (ref 0.00–0.07)
Basophils Absolute: 0 10*3/uL (ref 0.0–0.1)
Basophils Relative: 0 %
Eosinophils Absolute: 0.2 10*3/uL (ref 0.0–0.5)
Eosinophils Relative: 2 %
HCT: 25.1 % — ABNORMAL LOW (ref 36.0–46.0)
Hemoglobin: 8.2 g/dL — ABNORMAL LOW (ref 12.0–15.0)
Immature Granulocytes: 1 %
Lymphocytes Relative: 31 %
Lymphs Abs: 3 10*3/uL (ref 0.7–4.0)
MCH: 28.3 pg (ref 26.0–34.0)
MCHC: 32.7 g/dL (ref 30.0–36.0)
MCV: 86.6 fL (ref 80.0–100.0)
Monocytes Absolute: 1.2 10*3/uL — ABNORMAL HIGH (ref 0.1–1.0)
Monocytes Relative: 13 %
Neutro Abs: 5.1 10*3/uL (ref 1.7–7.7)
Neutrophils Relative %: 53 %
Platelets: 440 10*3/uL — ABNORMAL HIGH (ref 150–400)
RBC: 2.9 MIL/uL — ABNORMAL LOW (ref 3.87–5.11)
RDW: 14.8 % (ref 11.5–15.5)
WBC: 9.7 10*3/uL (ref 4.0–10.5)
nRBC: 0.4 % — ABNORMAL HIGH (ref 0.0–0.2)

## 2021-03-14 LAB — BASIC METABOLIC PANEL
Anion gap: 12 (ref 5–15)
BUN: 75 mg/dL — ABNORMAL HIGH (ref 8–23)
CO2: 21 mmol/L — ABNORMAL LOW (ref 22–32)
Calcium: 7.8 mg/dL — ABNORMAL LOW (ref 8.9–10.3)
Chloride: 105 mmol/L (ref 98–111)
Creatinine, Ser: 5.63 mg/dL — ABNORMAL HIGH (ref 0.44–1.00)
GFR, Estimated: 8 mL/min — ABNORMAL LOW (ref >60)
Glucose, Bld: 143 mg/dL — ABNORMAL HIGH (ref 70–99)
Potassium: 4.2 mmol/L (ref 3.5–5.1)
Sodium: 138 mmol/L (ref 135–145)

## 2021-03-14 LAB — GLUCOSE, CAPILLARY
Glucose-Capillary: 115 mg/dL — ABNORMAL HIGH (ref 70–99)
Glucose-Capillary: 112 mg/dL — ABNORMAL HIGH (ref 70–99)
Glucose-Capillary: 173 mg/dL — ABNORMAL HIGH (ref 70–99)
Glucose-Capillary: 163 mg/dL — ABNORMAL HIGH (ref 70–99)

## 2021-03-14 NOTE — Progress Notes (Signed)
[facility] Progress Note    Assessment/ Plan:   1. AKI on CKD (Stage IV-V): Creatinine essentially stable at this time likely at her baseline.  Minimal AKI at this time and mostly just progressive CKD.    - Strict INO and renal diet and fluid/salt restriction  - Continue Lasix oral 80 mg twice daily - [facility] has seen the patient and planning for access creation next week if the patient is willing; patient still contemplating whether she will proceed with access creation this hospitalization.  Discussing with her husband today.  Appreciate [facility] assistance. If she is willing to proceed with access creation likely will happen Tuesday; this is what we would encourage. If she decides against that then she is likely stable for discharge given no dialysis need at this time.  2. HFpEF exacerbation:  3. CAD  4. HLD: Volume status overall improved  4. HTN: -Continue medications as prescribed.  3. IDDM: - Continue management per primary team  5. Anemia:  - Receiving IV iron  Subjective:   Patient feels well today.  Still trying to decide regarding access.   Objective:   BP 137/71 (BP Location: Right Arm)   Pulse 72   Temp 97.9 F (36.6 C) (Oral)   Resp 18   Ht 5\' 3"  (1.6 m)   Wt 96.7 kg   SpO2 99%   BMI 37.75 kg/m   Intake/Output Summary (Last 24 hours) at 03/14/2021 0913 Last data filed at 03/14/2021 3299 Gross per 24 hour  Intake 720 ml  Output 700 ml  Net 20 ml   Weight change: -0.816 kg  Physical Exam: GEN: wdwn, sitting in bed, nad ENT: no nasal discharge, mmm EYES: no scleral icterus, eomi CV: normal rate, no murmurs PULM: no iwob, bilateral chest rise ABD: NABS, non-distended SKIN: no rashes or jaundice EXT: 1+ edema in the bilateral lower extremities, warm and well perfused   Imaging: VAS [place] UPPER EXT VEIN MAPPING (PRE-OP AVF)  Result Date: 03/12/2021 [place] Patient Name:  Carolyn Russo [place] [facility]  Date of Exam:   03/12/2021  Medical Rec #: [id number]       Accession #:    [id number] Date of Birth: 1951/04/07       Patient Gender: F Patient Age:   70Y Exam Location:  [facility] Procedure:      VAS [place] UPPER EXT VEIN MAPPING (PRE-OP AVF) Referring Phys: [place] --------------------------------------------------------------------------------  Indications: Pre-access. Limitations: [place], movement Comparison Study: No previous exam Performing Technologist: Rogelia Rohrer  Examination Guidelines: A complete evaluation includes B-mode imaging, spectral Doppler, color Doppler, and power Doppler as needed of all accessible portions of each vessel. Bilateral testing is considered an integral part of a complete examination. Limited examinations for reoccurring indications may be performed as noted. +-----------------+-------------+----------+--------------+ Right Cephalic   Diameter (cm)Depth (cm)   Findings    +-----------------+-------------+----------+--------------+ Shoulder             0.36        1.10                  +-----------------+-------------+----------+--------------+ Prox upper arm       0.25        1.46                  +-----------------+-------------+----------+--------------+ Mid upper arm        0.21        0.77                  +-----------------+-------------+----------+--------------+  Dist upper arm       0.20        0.40                  +-----------------+-------------+----------+--------------+ Antecubital fossa    0.16        0.56                  +-----------------+-------------+----------+--------------+ Prox forearm         0.11        0.27     branching    +-----------------+-------------+----------+--------------+ Mid forearm                             not visualized +-----------------+-------------+----------+--------------+ Dist forearm         0.06        0.22                  +-----------------+-------------+----------+--------------+ Wrist                                    not visualized +-----------------+-------------+----------+--------------+ +-----------------+-------------+----------+--------------+ Right Basilic    Diameter (cm)Depth (cm)   Findings    +-----------------+-------------+----------+--------------+ Prox upper arm       0.50                              +-----------------+-------------+----------+--------------+ Mid upper arm        0.29                              +-----------------+-------------+----------+--------------+ Dist upper arm       0.35                              +-----------------+-------------+----------+--------------+ Antecubital fossa    0.29                 branching    +-----------------+-------------+----------+--------------+ Prox forearm         0.07                              +-----------------+-------------+----------+--------------+ Mid forearm          0.07                              +-----------------+-------------+----------+--------------+ Distal forearm       0.05                              +-----------------+-------------+----------+--------------+ Wrist                                   not visualized +-----------------+-------------+----------+--------------+ +-----------------+-------------+----------+---------+ Left Cephalic    Diameter (cm)Depth (cm)Findings  +-----------------+-------------+----------+---------+ Shoulder             0.20        1.09             +-----------------+-------------+----------+---------+ Prox upper arm       0.14        0.52             +-----------------+-------------+----------+---------+  Mid upper arm        0.22        0.50             +-----------------+-------------+----------+---------+ Dist upper arm       0.16        0.36             +-----------------+-------------+----------+---------+ Antecubital fossa    0.16        0.65              +-----------------+-------------+----------+---------+ Prox forearm         0.12        0.19   branching +-----------------+-------------+----------+---------+ Mid forearm          0.09        0.34             +-----------------+-------------+----------+---------+ Dist forearm         0.06        0.24             +-----------------+-------------+----------+---------+ Wrist                0.05        0.28             +-----------------+-------------+----------+---------+ +-----------------+-------------+----------+--------------+ Left Basilic     Diameter (cm)Depth (cm)   Findings    +-----------------+-------------+----------+--------------+ Prox upper arm       0.42                              +-----------------+-------------+----------+--------------+ Mid upper arm        0.25                              +-----------------+-------------+----------+--------------+ Dist upper arm       0.22                              +-----------------+-------------+----------+--------------+ Antecubital fossa    0.13                 branching    +-----------------+-------------+----------+--------------+ Prox forearm         0.15                              +-----------------+-------------+----------+--------------+ Mid forearm          0.08                              +-----------------+-------------+----------+--------------+ Distal forearm       0.09                              +-----------------+-------------+----------+--------------+ Wrist                                   not visualized +-----------------+-------------+----------+--------------+ *See table(s) above for measurements and observations.  Diagnosing physician: Deitra Mayo MD Electronically signed by Deitra Mayo MD on 03/12/2021 at 8:15:20 PM.    Final     Labs: BMET Recent Labs  Lab 03/09/21 1435 03/09/21 2256 03/11/21 0309 03/11/21 1410 03/12/21 4235 03/13/21 0245  03/14/21 0159  NA 138 142 139  --  139 140 138  K 4.5 4.4 4.4  --  3.8 4.1 4.2  CL 108  --  110  --  108 108 105  CO2 22  --  20*  --  23 21* 21*  GLUCOSE 295*  --  80  --  129* 95 143*  BUN 58*  --  65*  --  69* 72* 75*  CREATININE 5.35*  --  5.95*  --  5.67* 5.53* 5.63*  CALCIUM 8.8*  --  8.3*  --  8.1* 8.1* 7.8*  PHOS  --   --   --  5.7*  --   --   --    CBC Recent Labs  Lab 03/09/21 1435 03/09/21 2256 03/11/21 1410  WBC 9.7  --  8.1  NEUTROABS 6.4  --   --   HGB 7.4* 8.2* 7.5*  HCT 22.8* 24.0* 22.8*  MCV 86.4  --  86.4  PLT 398  --  421*    Medications:    . aspirin  81 mg Oral Daily  . calcitRIOL  0.25 mcg Oral Once per day on Mon Wed Fri  . carvedilol  12.5 mg Oral BID WC  . fluticasone  2 spray Each Nare Daily  . furosemide  80 mg Oral BID  . heparin  5,000 Units Subcutaneous Q8H  . hydrALAZINE  25 mg Oral Q8H  . insulin aspart  0-5 Units Subcutaneous QHS  . insulin aspart  0-9 Units Subcutaneous TID WC  . insulin detemir  15 Units Subcutaneous BID  . mometasone-formoterol  2 puff Inhalation BID  . rosuvastatin  10 mg Oral Daily     9:13 AM, 03/14/2021

## 2021-03-14 NOTE — Progress Notes (Signed)
   VASCULAR SURGERY ASSESSMENT & PLAN:   STAGE V CHRONIC KIDNEY DISEASE: Based on her vein map it looks like her only real option for a fistula would be a basilic vein transposition on the right.  This would likely be done in 2 stages.  I could potentially do surgery Tuesday.  I have discussed this again with her this morning and she has not yet decided if she would like to proceed with surgery.  Her husband is coming in today and she will discuss this with him.  SUBJECTIVE:   No specific complaints this morning.  PHYSICAL EXAM:   Vitals:   03/13/21 2003 03/13/21 2012 03/14/21 0217 03/14/21 0329  BP: (!) 145/69   137/71  Pulse: 64   72  Resp: 17   18  Temp: 98 F (36.7 C)   97.9 F (36.6 C)  TempSrc: Oral   Oral  SpO2: 98% 98%  100%  Weight:   96.7 kg   Height:       Palpable right radial pulse.  LABS:   CBG (last 3)  Recent Labs    03/13/21 1553 03/13/21 2103 03/14/21 0610  GLUCAP 114* 96 115*    PROBLEM LIST:    Principal Problem:   Acute on chronic diastolic CHF (congestive heart failure) (HCC) Active Problems:   Type 2 diabetes mellitus (HCC)   CAD (coronary artery disease), native coronary artery   Asthma   Anemia in chronic kidney disease   CKD (chronic kidney disease), stage V ([place])   Hypertensive urgency   CURRENT MEDS:   . aspirin  81 mg Oral Daily  . calcitRIOL  0.25 mcg Oral Once per day on Mon Wed Fri  . carvedilol  12.5 mg Oral BID WC  . fluticasone  2 spray Each Nare Daily  . furosemide  80 mg Oral BID  . heparin  5,000 Units Subcutaneous Q8H  . hydrALAZINE  25 mg Oral Q8H  . insulin aspart  0-5 Units Subcutaneous QHS  . insulin aspart  0-9 Units Subcutaneous TID WC  . insulin detemir  15 Units Subcutaneous BID  . mometasone-formoterol  2 puff Inhalation BID  . rosuvastatin  10 mg Oral Daily    Deitra Mayo Office: [phone] 03/14/2021

## 2021-03-14 NOTE — Progress Notes (Signed)
PROGRESS NOTE    Carolyn Russo  KNL:[id number] DOB: 10-Dec-1950 DOA: 03/09/2021 PCP: [name], MD    Chief Complaint  Patient presents with  . Low Hemoglobin   . Leg Swelling    Brief Narrative: 70 year old lady prior history of stage IV CKD, insulin-dependent diabetes mellitus, coronary artery disease, hypertension presents to ED for worsening sob, bilateral lower extremity swelling. Chest x-ray with cardiomegaly, vascular congestion, possible left pleural effusion, and indeterminate bilateral hilar fullness.  Chemistry panel features a creatinine of 5.35 and glucose 295.  CBC notable for hemoglobin of 7.4.  High-sensitivity troponin was 51 then 54.  BNP elevated to 819.  Patient was given 80 mg IV Lasix, nitroglycerin, and acetaminophen in the ED. She was admitted for acute on chronic diastolic CHF. She has diuresed about 4.5 lit since admission. Transitioned to oral lasix 80 mg BID.  Vascular surgery consulted for access for HD. She would like to discuss with  Her husband and get back to [place].    Assessment & Plan:   Principal Problem:   Acute on chronic diastolic CHF (congestive heart failure) (HCC) Active Problems:   Type 2 diabetes mellitus (HCC)   CAD (coronary artery disease), native coronary artery   Asthma   Anemia in chronic kidney disease   CKD (chronic kidney disease), stage V (HCC)   Hypertensive urgency   Acute on chronic diastolic CHF:  BNP is 790, CXR shows CHF, pedal edema, sob and hypoxia requiring 2lit of [place] oxygen on admission. Echocardiogram  Showed Left ventricular ejection fraction, by estimation, is 50%, with low normal function, no regional wall motion abnormalities. There is mild left ventricular hypertrophy and grade 2 diastolic dysfunction.  Improving , on IV lasix 60 gm BID transitioned to lasix 80 mg BID. Marland Kitchen Diuresed about 4.6 lit since admission.  Continue with strict intake and output/ daily weights.  Creatinine worsened from 5.3 to 5.9 to 5.6  to 5.5 . potassium wnl.  She is weaned off oxygen and reports feeling better.     Stage 4 CKD:  Baseline creatinine around 3, worsened to 5 in 01/2021, saw Dr Johnney Ou, . Presented with a creatinine of 5.35 , worsened to 5.95  -stabilized around 5.6.  Nephrology consulted and recommendations given.  She is not uremic, no urgent need for HD.  Vascular surgery consulted for vascular access for HD possibly prior to discharge? currently waiting for the patient to let us know of their decision.  Vein mapping done.    Insulin dependent DM A1C IS 6.2% CBG (last 3)  Recent Labs    03/13/21 2103 03/14/21 0610 03/14/21 1125  GLUCAP 96 115* 112*   Resume SSI and continue with Levemir. No changes in meds.   Hypertensive Urgency:  Resolved.  BP parameters are optimal.  Resume hydralazine 25 mg TID,  COREG 6.25 mg BID.     CAD:  Continue with aspirin and statin. No new complaints.     Anemia of chronic disease sec to stage 4 CKD.  Transfuse to keep hemoglobin greater than 7.  Low iron stores, iron infusion given. Cbc pending.     Asthma;  No wheezing or rhonchi.  Continue with bronchodilators.    Hyperlipidemia:  Resume crestor.    DVT prophylaxis: heparin.  Code Status: (Full code.  Family Communication: none at bedside.  Disposition:   Status is: Inpatient  Remains inpatient appropriate because:Ongoing diagnostic testing needed not appropriate for outpatient work up and IV treatments appropriate due to intensity of  illness or inability to take PO   Dispo: The patient is from: Home              Anticipated d/c is to: Home              Patient currently is not medically stable to d/c.   Difficult to place patient No       Consultants:   Cardiology  Nephrology   Procedures: Echocardiogram   Antimicrobials: none.    Subjective: No chest pain breathing has improved,, no nausea, vomiting.    Objective: Vitals:   03/14/21 0217 03/14/21 0329  03/14/21 0725 03/14/21 1333  BP:  137/71  133/74  Pulse:  72  69  Resp:  18    Temp:  97.9 F (36.6 C)  97.6 F (36.4 C)  TempSrc:  Oral  Oral  SpO2:  100% 99% 94%  Weight: 96.7 kg     Height:        Intake/Output Summary (Last 24 hours) at 03/14/2021 1412 Last data filed at 03/14/2021 1245 Gross per 24 hour  Intake 360 ml  Output 700 ml  Net -340 ml   Filed Weights   03/12/21 0158 03/13/21 0025 03/14/21 0217  Weight: 97.5 kg 97.5 kg 96.7 kg    Examination:  General exam: Elderly woman well-developed not in any kind of distress Respiratory system: Air entry fair bilateral no wheezing or rhonchi on room air Cardiovascular system: S1-S2 heard regular rate rhythm, no JVD pedal edema present Gastrointestinal system: Abdomen is soft nontender bowel sounds normal Central nervous system: Alert and oriented Extremities: Bilateral 3+ edema improving Skin: No rashes seen Psychiatry: Mood is appropriate.      Data Reviewed: I have personally reviewed following labs and imaging studies  CBC: Recent Labs  Lab 03/09/21 1435 03/09/21 2256 03/11/21 1410  WBC 9.7  --  8.1  NEUTROABS 6.4  --   --   HGB 7.4* 8.2* 7.5*  HCT 22.8* 24.0* 22.8*  MCV 86.4  --  86.4  PLT 398  --  421*    Basic Metabolic Panel: Recent Labs  Lab 03/09/21 1435 03/09/21 2256 03/11/21 0309 03/11/21 1410 03/12/21 0621 03/13/21 0245 03/14/21 0159  NA 138 142 139  --  139 140 138  K 4.5 4.4 4.4  --  3.8 4.1 4.2  CL 108  --  110  --  108 108 105  CO2 22  --  20*  --  23 21* 21*  GLUCOSE 295*  --  80  --  129* 95 143*  BUN 58*  --  65*  --  69* 72* 75*  CREATININE 5.35*  --  5.95*  --  5.67* 5.53* 5.63*  CALCIUM 8.8*  --  8.3*  --  8.1* 8.1* 7.8*  MG  --   --   --  2.0  --   --   --   PHOS  --   --   --  5.7*  --   --   --     GFR: Estimated Creatinine Clearance: 10 mL/min (A) (by C-G formula based on SCr of 5.63 mg/dL (H)).  Liver Function Tests: Recent Labs  Lab 03/09/21 1435  AST 27   ALT 49*  ALKPHOS 96  BILITOT 0.5  PROT 6.3*  ALBUMIN 3.0*    CBG: Recent Labs  Lab 03/13/21 1058 03/13/21 1553 03/13/21 2103 03/14/21 0610 03/14/21 1125  GLUCAP 173* 114* 96 115* 112*     Recent Results (  from the past 240 hour(s))  Resp Panel by RT-PCR (Flu A&B, Covid) Nasopharyngeal Swab     Status: None   Collection Time: 03/09/21  9:28 PM   Specimen: Nasopharyngeal Swab; Nasopharyngeal(NP) swabs in vial transport medium  Result Value Ref Range Status   SARS Coronavirus 2 by RT PCR NEGATIVE NEGATIVE Final    Comment: (NOTE) SARS-CoV-2 target nucleic acids are NOT DETECTED.  The SARS-CoV-2 RNA is generally detectable in upper respiratory specimens during the acute phase of infection. The lowest concentration of SARS-CoV-2 viral copies this assay can detect is 138 copies/mL. A negative result does not preclude SARS-Cov-2 infection and should not be used as the sole basis for treatment or other patient management decisions. A negative result may occur with  improper specimen collection/handling, submission of specimen other than nasopharyngeal swab, presence of viral mutation(s) within the areas targeted by this assay, and inadequate number of viral copies(<138 copies/mL). A negative result must be combined with clinical observations, patient history, and epidemiological information. The expected result is Negative.  Fact Sheet for Patients:  EntrepreneurPulse.com.au  Fact Sheet for Healthcare Providers:  IncredibleEmployment.be  This test is no t yet approved or cleared by the [place] FDA and  has been authorized for detection and/or diagnosis of SARS-CoV-2 by FDA under an Emergency Use Authorization (EUA). This EUA will remain  in effect (meaning this test can be used) for the duration of the COVID-19 declaration under Section 564(b)(1) of the Act, 21 U.S.C.section 360bbb-3(b)(1), unless the authorization is terminated   or revoked sooner.       Influenza A by PCR NEGATIVE NEGATIVE Final   Influenza B by PCR NEGATIVE NEGATIVE Final    Comment: (NOTE) The Xpert Xpress SARS-CoV-2/FLU/RSV plus assay is intended as an aid in the diagnosis of influenza from Nasopharyngeal swab specimens and should not be used as a sole basis for treatment. Nasal washings and aspirates are unacceptable for Xpert Xpress SARS-CoV-2/FLU/RSV testing.  Fact Sheet for Patients: EntrepreneurPulse.com.au  Fact Sheet for Healthcare Providers: IncredibleEmployment.be  This test is not yet approved or cleared by the [place] FDA and has been authorized for detection and/or diagnosis of SARS-CoV-2 by FDA under an Emergency Use Authorization (EUA). This EUA will remain in effect (meaning this test can be used) for the duration of the COVID-19 declaration under Section 564(b)(1) of the Act, 21 U.S.C. section 360bbb-3(b)(1), unless the authorization is terminated or revoked.  Performed at [facility], [place] [address] [place], [place] [address]          Radiology Studies: VAS [place] UPPER EXT VEIN MAPPING (PRE-OP AVF)  Result Date: 03/12/2021 UPPER EXTREMITY VEIN MAPPING Patient Name:  JUNNIE LOSCHIAVO [facility]  Date of Exam:   03/12/2021 Medical Rec #: [id number]       Accession #:    [id number] Date of Birth: 1951-06-18       Patient Gender: F Patient Age:   70Y Exam Location:  [facility] Procedure:      VAS [place] UPPER EXT VEIN MAPPING (PRE-OP AVF) Referring Phys: [place] --------------------------------------------------------------------------------  Indications: Pre-access. Limitations: [place], movement Comparison Study: No previous exam Performing Technologist: Rogelia Rohrer  Examination Guidelines: A complete evaluation includes B-mode imaging, spectral Doppler, color Doppler, and power Doppler as needed of all accessible portions of each vessel. Bilateral testing is  considered an integral part of a complete examination. Limited examinations for reoccurring indications may be performed as noted. +-----------------+-------------+----------+--------------+ Right Cephalic   Diameter (cm)Depth (  cm)   Findings    +-----------------+-------------+----------+--------------+ Shoulder             0.36        1.10                  +-----------------+-------------+----------+--------------+ Prox upper arm       0.25        1.46                  +-----------------+-------------+----------+--------------+ Mid upper arm        0.21        0.77                  +-----------------+-------------+----------+--------------+ Dist upper arm       0.20        0.40                  +-----------------+-------------+----------+--------------+ Antecubital fossa    0.16        0.56                  +-----------------+-------------+----------+--------------+ Prox forearm         0.11        0.27     branching    +-----------------+-------------+----------+--------------+ Mid forearm                             not visualized +-----------------+-------------+----------+--------------+ Dist forearm         0.06        0.22                  +-----------------+-------------+----------+--------------+ Wrist                                   not visualized +-----------------+-------------+----------+--------------+ +-----------------+-------------+----------+--------------+ Right Basilic    Diameter (cm)Depth (cm)   Findings    +-----------------+-------------+----------+--------------+ Prox upper arm       0.50                              +-----------------+-------------+----------+--------------+ Mid upper arm        0.29                              +-----------------+-------------+----------+--------------+ Dist upper arm       0.35                              +-----------------+-------------+----------+--------------+ Antecubital  fossa    0.29                 branching    +-----------------+-------------+----------+--------------+ Prox forearm         0.07                              +-----------------+-------------+----------+--------------+ Mid forearm          0.07                              +-----------------+-------------+----------+--------------+ Distal forearm       0.05                              +-----------------+-------------+----------+--------------+  Wrist                                   not visualized +-----------------+-------------+----------+--------------+ +-----------------+-------------+----------+---------+ Left Cephalic    Diameter (cm)Depth (cm)Findings  +-----------------+-------------+----------+---------+ Shoulder             0.20        1.09             +-----------------+-------------+----------+---------+ Prox upper arm       0.14        0.52             +-----------------+-------------+----------+---------+ Mid upper arm        0.22        0.50             +-----------------+-------------+----------+---------+ Dist upper arm       0.16        0.36             +-----------------+-------------+----------+---------+ Antecubital fossa    0.16        0.65             +-----------------+-------------+----------+---------+ Prox forearm         0.12        0.19   branching +-----------------+-------------+----------+---------+ Mid forearm          0.09        0.34             +-----------------+-------------+----------+---------+ Dist forearm         0.06        0.24             +-----------------+-------------+----------+---------+ Wrist                0.05        0.28             +-----------------+-------------+----------+---------+ +-----------------+-------------+----------+--------------+ Left Basilic     Diameter (cm)Depth (cm)   Findings    +-----------------+-------------+----------+--------------+ Prox upper arm       0.42                               +-----------------+-------------+----------+--------------+ Mid upper arm        0.25                              +-----------------+-------------+----------+--------------+ Dist upper arm       0.22                              +-----------------+-------------+----------+--------------+ Antecubital fossa    0.13                 branching    +-----------------+-------------+----------+--------------+ Prox forearm         0.15                              +-----------------+-------------+----------+--------------+ Mid forearm          0.08                              +-----------------+-------------+----------+--------------+ Distal forearm       0.09                              +-----------------+-------------+----------+--------------+  Wrist                                   not visualized +-----------------+-------------+----------+--------------+ *See table(s) above for measurements and observations.  Diagnosing physician: Deitra Mayo MD Electronically signed by Deitra Mayo MD on 03/12/2021 at 8:15:20 PM.    Final         Scheduled Meds: . aspirin  81 mg Oral Daily  . calcitRIOL  0.25 mcg Oral Once per day on Mon Wed Fri  . carvedilol  12.5 mg Oral BID WC  . fluticasone  2 spray Each Nare Daily  . furosemide  80 mg Oral BID  . heparin  5,000 Units Subcutaneous Q8H  . hydrALAZINE  25 mg Oral Q8H  . insulin aspart  0-5 Units Subcutaneous QHS  . insulin aspart  0-9 Units Subcutaneous TID WC  . insulin detemir  15 Units Subcutaneous BID  . mometasone-formoterol  2 puff Inhalation BID  . rosuvastatin  10 mg Oral Daily   Continuous Infusions: . sodium chloride    . ferric gluconate (FERRLECIT/NULECIT) IV 250 mg (03/14/21 1337)     LOS: 5 days        Hosie Poisson, MD [facility]   To contact the attending provider between 7A-7P or the covering provider during after hours 7P-7A, please log into  the web site [email or web address] and access using universal [place] password for that web site. If you do not have the password, please call the hospital operator.  03/14/2021, 2:12 PM

## 2021-03-14 NOTE — Plan of Care (Signed)
  Problem: Clinical Measurements: Goal: Respiratory complications will improve Outcome: Progressing   Problem: Coping: Goal: Level of anxiety will decrease Outcome: Progressing   

## 2021-03-15 DIAGNOSIS — N185 Chronic kidney disease, stage 5: Secondary | ICD-10-CM | POA: Diagnosis not present

## 2021-03-15 DIAGNOSIS — D631 Anemia in chronic kidney disease: Secondary | ICD-10-CM | POA: Diagnosis not present

## 2021-03-15 DIAGNOSIS — I5033 Acute on chronic diastolic (congestive) heart failure: Secondary | ICD-10-CM | POA: Diagnosis not present

## 2021-03-15 DIAGNOSIS — J810 Acute pulmonary edema: Secondary | ICD-10-CM | POA: Diagnosis not present

## 2021-03-15 LAB — BASIC METABOLIC PANEL
Anion gap: 10 (ref 5–15)
BUN: 77 mg/dL — ABNORMAL HIGH (ref 8–23)
CO2: 23 mmol/L (ref 22–32)
Calcium: 7.9 mg/dL — ABNORMAL LOW (ref 8.9–10.3)
Chloride: 107 mmol/L (ref 98–111)
Creatinine, Ser: 5.9 mg/dL — ABNORMAL HIGH (ref 0.44–1.00)
GFR, Estimated: 7 mL/min — ABNORMAL LOW (ref >60)
Glucose, Bld: 106 mg/dL — ABNORMAL HIGH (ref 70–99)
Potassium: 4 mmol/L (ref 3.5–5.1)
Sodium: 140 mmol/L (ref 135–145)

## 2021-03-15 LAB — GLUCOSE, CAPILLARY
Glucose-Capillary: 138 mg/dL — ABNORMAL HIGH (ref 70–99)
Glucose-Capillary: 102 mg/dL — ABNORMAL HIGH (ref 70–99)
Glucose-Capillary: 131 mg/dL — ABNORMAL HIGH (ref 70–99)
Glucose-Capillary: 132 mg/dL — ABNORMAL HIGH (ref 70–99)
Glucose-Capillary: 198 mg/dL — ABNORMAL HIGH (ref 70–99)

## 2021-03-15 MED ORDER — CEFAZOLIN SODIUM-DEXTROSE 2-4 GM/100ML-% IV SOLN
2.0000 g | INTRAVENOUS | Status: AC
  Administered 2021-03-16: 2 g via INTRAVENOUS
  Filled 2021-03-15: qty 100

## 2021-03-15 NOTE — Progress Notes (Signed)
PROGRESS NOTE    Carolyn Russo  NTI:[id number] DOB: January 12, 1951 DOA: 03/09/2021 PCP: [name], MD    Chief Complaint  Patient presents with  . Low Hemoglobin   . Leg Swelling    Brief Narrative: 70 year old lady prior history of stage IV CKD, insulin-dependent diabetes mellitus, coronary artery disease, hypertension presents to ED for worsening sob, bilateral lower extremity swelling. Chest x-ray with cardiomegaly, vascular congestion, possible left pleural effusion, and indeterminate bilateral hilar fullness.  Chemistry panel features a creatinine of 5.35 and glucose 295.  CBC notable for hemoglobin of 7.4.  High-sensitivity troponin was 51 then 54.  BNP elevated to 819.  Patient was given 80 mg IV Lasix, nitroglycerin, admitted for acute on chronic diastolic heart failure. Cardiology consulted,  Recommendations given and transitioned to oral lasix 80 mg BID.  Vascular surgery consulted for access for HD. She is scheduled for  first stage right basilic vein transposition tomorrow. Pt seen and examined this am, reports not feeling good. Wants to know when she can go home.   Assessment & Plan:   Principal Problem:   Acute on chronic diastolic CHF (congestive heart failure) (HCC) Active Problems:   Type 2 diabetes mellitus (HCC)   CAD (coronary artery disease), native coronary artery   Asthma   Anemia in chronic kidney disease   CKD (chronic kidney disease), stage V (HCC)   Hypertensive urgency   Acute on chronic diastolic CHF:  BNP is 867, CXR shows CHF, pedal edema, sob and hypoxia requiring 2lit of [place] oxygen on admission. Echocardiogram showed Left ventricular ejection fraction, by estimation, is 50%, with low normal function, no regional wall motion abnormalities. There is mild left ventricular hypertrophy and grade 2 diastolic dysfunction.  Initially started on IV lasix 60 gm BID transitioned to lasix 80 mg BID. Marland Kitchen Diuresed about 45.4 lit since admission.  Continue with  strict intake and output/ daily weights.  Creatinine worsened from 5.3 to 5.9 to 5.6 to 5.5 to 5.9 . potassium wnl.  She is weaned off oxygen and reports feeling better. But she has persistent leg edema.      Stage 4 CKD:  Baseline creatinine around 3, worsened to 5 in 01/2021, saw Dr Johnney Ou as outpatient, . Presented with a creatinine of 5.35 , worsened to 5.95 .  Nephrology consulted and recommendations given.  She is not uremic, no urgent need for HD.  Vascular surgery consulted for vascular access for HD possibly prior to discharge? She is scheduled for   first stage right basilic vein transposition tomorrow Vein mapping done.    Insulin dependent DM A1C IS 6.2%, well controlled CBG'S CBG (last 3)  Recent Labs    03/15/21 0615 03/15/21 0807 03/15/21 1125  GLUCAP 138* 102* 131*   Resume SSI and continue with Levemir. No changes in meds.   Hypertensive Urgency:  Resolved.  BP parameters are optimal.  Resume hydralazine 25 mg TID,  COREG 6.25 mg BID.     CAD:  Continue with aspirin and statin. No new complaints.     Anemia of chronic disease sec to stage 4 CKD.  Transfuse to keep hemoglobin greater than 7.  Low iron stores, iron infusion given. Hemoglobin stable around 8.2. .     Asthma;  No wheezing or rhonchi.  Continue with bronchodilators.    Hyperlipidemia:  Resume crestor.    DVT prophylaxis: heparin.  Code Status: (Full code.  Family Communication: none at bedside.  Disposition:   Status is: Inpatient  Remains  inpatient appropriate because:Ongoing diagnostic testing needed not appropriate for outpatient work up and IV treatments appropriate due to intensity of illness or inability to take PO   Dispo: The patient is from: Home              Anticipated d/c is to: Home              Patient currently is not medically stable to d/c.   Difficult to place patient No       Consultants:   Cardiology  Nephrology   Procedures:  Echocardiogram   Antimicrobials: none.    Subjective: Breathing the same as yesterday, did not sleep well.  No chest pain . Persistent leg edema. One BM yesterday.  Objective: Vitals:   03/14/21 2047 03/15/21 0520 03/15/21 0800 03/15/21 1126  BP:  (!) 145/67  (!) 153/71  Pulse:  71  69  Resp:  20  18  Temp:  98 F (36.7 C)  98.5 F (36.9 C)  TempSrc:  Oral Oral Oral  SpO2: 96% 100%  100%  Weight:  96.9 kg    Height:        Intake/Output Summary (Last 24 hours) at 03/15/2021 1239 Last data filed at 03/15/2021 7341 Gross per 24 hour  Intake 360 ml  Output 1225 ml  Net -865 ml   Filed Weights   03/13/21 0025 03/14/21 0217 03/15/21 0520  Weight: 97.5 kg 96.7 kg 96.9 kg    Examination:  General exam: Elderly woman not in any kind of distress Respiratory system: Diminished air entry at bases, no wheezing or rhonchi Cardiovascular system: S1-S2 heard regular rate rhythm, no JVD, 3+ leg edema Gastrointestinal system: Abdomen is soft and nontender bowel sounds normal Central nervous system: Alert and oriented Extremities: Bilateral leg edema improving Skin: No rashes seen Psychiatry: Mood is appropriate   Data Reviewed: I have personally reviewed following labs and imaging studies  CBC: Recent Labs  Lab 03/09/21 1435 03/09/21 2256 03/11/21 1410 03/14/21 0159  WBC 9.7  --  8.1 9.7  NEUTROABS 6.4  --   --  5.1  HGB 7.4* 8.2* 7.5* 8.2*  HCT 22.8* 24.0* 22.8* 25.1*  MCV 86.4  --  86.4 86.6  PLT 398  --  421* 440*    Basic Metabolic Panel: Recent Labs  Lab 03/11/21 0309 03/11/21 1410 03/12/21 0621 03/13/21 0245 03/14/21 0159 03/15/21 0729  NA 139  --  139 140 138 140  K 4.4  --  3.8 4.1 4.2 4.0  CL 110  --  108 108 105 107  CO2 20*  --  23 21* 21* 23  GLUCOSE 80  --  129* 95 143* 106*  BUN 65*  --  69* 72* 75* 77*  CREATININE 5.95*  --  5.67* 5.53* 5.63* 5.90*  CALCIUM 8.3*  --  8.1* 8.1* 7.8* 7.9*  MG  --  2.0  --   --   --   --   PHOS  --  5.7*  --    --   --   --     GFR: Estimated Creatinine Clearance: 9.6 mL/min (A) (by C-G formula based on SCr of 5.9 mg/dL (H)).  Liver Function Tests: Recent Labs  Lab 03/09/21 1435  AST 27  ALT 49*  ALKPHOS 96  BILITOT 0.5  PROT 6.3*  ALBUMIN 3.0*    CBG: Recent Labs  Lab 03/14/21 1616 03/14/21 2125 03/15/21 0615 03/15/21 0807 03/15/21 1125  GLUCAP 173* 163* 138* 102* 131*  Recent Results (from the past 240 hour(s))  Resp Panel by RT-PCR (Flu A&B, Covid) Nasopharyngeal Swab     Status: None   Collection Time: 03/09/21  9:28 PM   Specimen: Nasopharyngeal Swab; Nasopharyngeal(NP) swabs in vial transport medium  Result Value Ref Range Status   SARS Coronavirus 2 by RT PCR NEGATIVE NEGATIVE Final    Comment: (NOTE) SARS-CoV-2 target nucleic acids are NOT DETECTED.  The SARS-CoV-2 RNA is generally detectable in upper respiratory specimens during the acute phase of infection. The lowest concentration of SARS-CoV-2 viral copies this assay can detect is 138 copies/mL. A negative result does not preclude SARS-Cov-2 infection and should not be used as the sole basis for treatment or other patient management decisions. A negative result may occur with  improper specimen collection/handling, submission of specimen other than nasopharyngeal swab, presence of viral mutation(s) within the areas targeted by this assay, and inadequate number of viral copies(<138 copies/mL). A negative result must be combined with clinical observations, patient history, and epidemiological information. The expected result is Negative.  Fact Sheet for Patients:  EntrepreneurPulse.com.au  Fact Sheet for Healthcare Providers:  IncredibleEmployment.be  This test is no t yet approved or cleared by the [place] FDA and  has been authorized for detection and/or diagnosis of SARS-CoV-2 by FDA under an Emergency Use Authorization (EUA). This EUA will remain  in  effect (meaning this test can be used) for the duration of the COVID-19 declaration under Section 564(b)(1) of the Act, 21 U.S.C.section 360bbb-3(b)(1), unless the authorization is terminated  or revoked sooner.       Influenza A by PCR NEGATIVE NEGATIVE Final   Influenza B by PCR NEGATIVE NEGATIVE Final    Comment: (NOTE) The Xpert Xpress SARS-CoV-2/FLU/RSV plus assay is intended as an aid in the diagnosis of influenza from Nasopharyngeal swab specimens and should not be used as a sole basis for treatment. Nasal washings and aspirates are unacceptable for Xpert Xpress SARS-CoV-2/FLU/RSV testing.  Fact Sheet for Patients: EntrepreneurPulse.com.au  Fact Sheet for Healthcare Providers: IncredibleEmployment.be  This test is not yet approved or cleared by the [place] FDA and has been authorized for detection and/or diagnosis of SARS-CoV-2 by FDA under an Emergency Use Authorization (EUA). This EUA will remain in effect (meaning this test can be used) for the duration of the COVID-19 declaration under Section 564(b)(1) of the Act, 21 U.S.C. section 360bbb-3(b)(1), unless the authorization is terminated or revoked.  Performed at [facility], [place] [address], [place], [place] [address]          Radiology Studies: No results found.      Scheduled Meds: . aspirin  81 mg Oral Daily  . calcitRIOL  0.25 mcg Oral Once per day on Mon Wed Fri  . carvedilol  12.5 mg Oral BID WC  . fluticasone  2 spray Each Nare Daily  . furosemide  80 mg Oral BID  . heparin  5,000 Units Subcutaneous Q8H  . hydrALAZINE  25 mg Oral Q8H  . insulin aspart  0-5 Units Subcutaneous QHS  . insulin aspart  0-9 Units Subcutaneous TID WC  . insulin detemir  15 Units Subcutaneous BID  . mometasone-formoterol  2 puff Inhalation BID  . rosuvastatin  10 mg Oral Daily   Continuous Infusions: . sodium chloride    . [START ON 03/16/2021]  ceFAZolin (ANCEF) IV        LOS: 6 days        Hosie Poisson, MD [facility]  To contact the attending provider between 7A-7P or the covering provider during after hours 7P-7A, please log into the web site [email or web address] and access using universal [place] password for that web site. If you do not have the password, please call the hospital operator.  03/15/2021, 12:39 PM

## 2021-03-15 NOTE — Progress Notes (Signed)
   VASCULAR SURGERY ASSESSMENT & PLAN:   STAGE V CHRONIC KIDNEY DISEASE: The patient is agreeable to proceed with a first stage right basilic vein transposition tomorrow.  All questions were answered.  Her IV has been removed from the right arm.  I have written preop orders.   SUBJECTIVE:   No complaints this morning.  PHYSICAL EXAM:   Vitals:   03/14/21 1333 03/14/21 1937 03/14/21 2047 03/15/21 0520  BP: 133/74 133/70  (!) 145/67  Pulse: 69 67  71  Resp:  16  20  Temp: 97.6 F (36.4 C) 97.6 F (36.4 C)  98 F (36.7 C)  TempSrc: Oral Oral  Oral  SpO2: 94% 97% 96% 100%  Weight:    96.9 kg  Height:       Her IV has been removed from the right arm. She has a palpable right radial pulse.  LABS:   Lab Results  Component Value Date   WBC 9.7 03/14/2021   HGB 8.2 (L) 03/14/2021   HCT 25.1 (L) 03/14/2021   MCV 86.6 03/14/2021   PLT 440 (H) 03/14/2021   Lab Results  Component Value Date   CREATININE 5.63 (H) 03/14/2021    CBG (last 3)  Recent Labs    03/14/21 1616 03/14/21 2125 03/15/21 0615  GLUCAP 173* 163* 138*    PROBLEM LIST:    Principal Problem:   Acute on chronic diastolic CHF (congestive heart failure) (HCC) Active Problems:   Type 2 diabetes mellitus (HCC)   CAD (coronary artery disease), native coronary artery   Asthma   Anemia in chronic kidney disease   CKD (chronic kidney disease), stage V ([place])   Hypertensive urgency   CURRENT MEDS:   . aspirin  81 mg Oral Daily  . calcitRIOL  0.25 mcg Oral Once per day on Mon Wed Fri  . carvedilol  12.5 mg Oral BID WC  . fluticasone  2 spray Each Nare Daily  . furosemide  80 mg Oral BID  . heparin  5,000 Units Subcutaneous Q8H  . hydrALAZINE  25 mg Oral Q8H  . insulin aspart  0-5 Units Subcutaneous QHS  . insulin aspart  0-9 Units Subcutaneous TID WC  . insulin detemir  15 Units Subcutaneous BID  . mometasone-formoterol  2 puff Inhalation BID  . rosuvastatin  10 mg Oral Daily    Deitra Mayo Office: [phone] 03/15/2021

## 2021-03-15 NOTE — Progress Notes (Signed)
[facility] Progress Note    Assessment/ Plan:   1. AKI on CKD (Stage IV-V): Creatinine essentially stable at this time likely at her baseline.  Minimal AKI at this time and mostly just progressive CKD.    - Strict INO and renal diet and fluid/salt restriction.  Added 1.5 liter fluid restriction - Continue Lasix oral 80 mg twice daily  - VVS has seen the patient and planning for access creation on 5/24.  Appreciate their assistance  2. HFpEF exacerbation:  volume status improved.  Continue lasix   3. CAD - on coreg  4. HTN: -Continue current regimen    3. IDDM: - Continue management per primary team  5. Anemia CKD and iron deficiency  - has been receiving IV iron - will pause per patient request as had nausea and headache with one dose  Disposition - getting an AVF.     Subjective:   Saw vascular yesterday and planning for AVF tomorrow.  No labs yet today but BMP is ordered.  She had 1.2 liters UOP over 5/22.  Spoke with her husband at bedside.  She is not on oxygen at home but on 2 liters here.  She states swelling and breathing are much better  Review of systems:  denies n/v today  Eating breakfast now Denies shortness of breath or chest pain    Objective:   BP (!) 145/67 (BP Location: Right Arm)   Pulse 71   Temp 98 F (36.7 C) (Oral)   Resp 20   Ht 5\' 3"  (1.6 m)   Wt 96.9 kg Comment: scale c  SpO2 100%   BMI 37.84 kg/m   Intake/Output Summary (Last 24 hours) at 03/15/2021 0740 Last data filed at 03/15/2021 6962 Gross per 24 hour  Intake 360 ml  Output 1225 ml  Net -865 ml   Weight change: 0.227 kg  Physical Exam: General adult female in bed in no acute distress HEENT normocephalic atraumatic extraocular movements intact sclera anicteric Neck supple trachea midline Lungs left basilar crackles; otherwise clear; normal work of breathing at rest; on 2 liters Heart S1S2 no rub Abdomen soft nontender nondistended Extremities 1+ edema  bilateral lower extremities Psych normal mood and affect Neuro - alert and oriented x 3 provides hx and follows commands   Imaging: No results found.  Labs: BMET Recent Labs  Lab 03/09/21 1435 03/09/21 2256 03/11/21 0309 03/11/21 1410 03/12/21 0621 03/13/21 0245 03/14/21 0159  NA 138 142 139  --  139 140 138  K 4.5 4.4 4.4  --  3.8 4.1 4.2  CL 108  --  110  --  108 108 105  CO2 22  --  20*  --  23 21* 21*  GLUCOSE 295*  --  80  --  129* 95 143*  BUN 58*  --  65*  --  69* 72* 75*  CREATININE 5.35*  --  5.95*  --  5.67* 5.53* 5.63*  CALCIUM 8.8*  --  8.3*  --  8.1* 8.1* 7.8*  PHOS  --   --   --  5.7*  --   --   --    CBC Recent Labs  Lab 03/09/21 1435 03/09/21 2256 03/11/21 1410 03/14/21 0159  WBC 9.7  --  8.1 9.7  NEUTROABS 6.4  --   --  5.1  HGB 7.4* 8.2* 7.5* 8.2*  HCT 22.8* 24.0* 22.8* 25.1*  MCV 86.4  --  86.4 86.6  PLT 398  --  421* 440*    Medications:    . aspirin  81 mg Oral Daily  . calcitRIOL  0.25 mcg Oral Once per day on Mon Wed Fri  . carvedilol  12.5 mg Oral BID WC  . fluticasone  2 spray Each Nare Daily  . furosemide  80 mg Oral BID  . heparin  5,000 Units Subcutaneous Q8H  . hydrALAZINE  25 mg Oral Q8H  . insulin aspart  0-5 Units Subcutaneous QHS  . insulin aspart  0-9 Units Subcutaneous TID WC  . insulin detemir  15 Units Subcutaneous BID  . mometasone-formoterol  2 puff Inhalation BID  . rosuvastatin  10 mg Oral Daily    Claudia Desanctis, MD 8:02 AM  03/15/2021

## 2021-03-15 NOTE — H&P (View-Only) (Signed)
   VASCULAR SURGERY ASSESSMENT & PLAN:   STAGE V CHRONIC KIDNEY DISEASE: The patient is agreeable to proceed with a first stage right basilic vein transposition tomorrow.  All questions were answered.  Her IV has been removed from the right arm.  I have written preop orders.   SUBJECTIVE:   No complaints this morning.  PHYSICAL EXAM:   Vitals:   03/14/21 1333 03/14/21 1937 03/14/21 2047 03/15/21 0520  BP: 133/74 133/70  (!) 145/67  Pulse: 69 67  71  Resp:  16  20  Temp: 97.6 F (36.4 C) 97.6 F (36.4 C)  98 F (36.7 C)  TempSrc: Oral Oral  Oral  SpO2: 94% 97% 96% 100%  Weight:    96.9 kg  Height:       Her IV has been removed from the right arm. She has a palpable right radial pulse.  LABS:   Lab Results  Component Value Date   WBC 9.7 03/14/2021   HGB 8.2 (L) 03/14/2021   HCT 25.1 (L) 03/14/2021   MCV 86.6 03/14/2021   PLT 440 (H) 03/14/2021   Lab Results  Component Value Date   CREATININE 5.63 (H) 03/14/2021    CBG (last 3)  Recent Labs    03/14/21 1616 03/14/21 2125 03/15/21 0615  GLUCAP 173* 163* 138*    PROBLEM LIST:    Principal Problem:   Acute on chronic diastolic CHF (congestive heart failure) (HCC) Active Problems:   Type 2 diabetes mellitus (HCC)   CAD (coronary artery disease), native coronary artery   Asthma   Anemia in chronic kidney disease   CKD (chronic kidney disease), stage V ([place])   Hypertensive urgency   CURRENT MEDS:   . aspirin  81 mg Oral Daily  . calcitRIOL  0.25 mcg Oral Once per day on Mon Wed Fri  . carvedilol  12.5 mg Oral BID WC  . fluticasone  2 spray Each Nare Daily  . furosemide  80 mg Oral BID  . heparin  5,000 Units Subcutaneous Q8H  . hydrALAZINE  25 mg Oral Q8H  . insulin aspart  0-5 Units Subcutaneous QHS  . insulin aspart  0-9 Units Subcutaneous TID WC  . insulin detemir  15 Units Subcutaneous BID  . mometasone-formoterol  2 puff Inhalation BID  . rosuvastatin  10 mg Oral Daily    Deitra Mayo Office: [phone] 03/15/2021

## 2021-03-16 ENCOUNTER — Inpatient Hospital Stay (HOSPITAL_COMMUNITY): Payer: Medicare Other | Admitting: Certified Registered Nurse Anesthetist

## 2021-03-16 ENCOUNTER — Encounter (HOSPITAL_COMMUNITY)
Admission: EM | Disposition: A | Discharge status: Home Health | Payer: Self-pay | Source: Home / Self Care | Attending: Family Medicine

## 2021-03-16 ENCOUNTER — Encounter (HOSPITAL_COMMUNITY): Payer: Self-pay | Admitting: Family Medicine

## 2021-03-16 DIAGNOSIS — I5033 Acute on chronic diastolic (congestive) heart failure: Secondary | ICD-10-CM | POA: Diagnosis not present

## 2021-03-16 DIAGNOSIS — I959 Hypotension, unspecified: Secondary | ICD-10-CM | POA: Diagnosis not present

## 2021-03-16 DIAGNOSIS — N185 Chronic kidney disease, stage 5: Secondary | ICD-10-CM | POA: Diagnosis not present

## 2021-03-16 DIAGNOSIS — R001 Bradycardia, unspecified: Secondary | ICD-10-CM

## 2021-03-16 LAB — CBC WITH DIFFERENTIAL/PLATELET
Abs Immature Granulocytes: 0.12 10*3/uL — ABNORMAL HIGH (ref 0.00–0.07)
Basophils Absolute: 0 10*3/uL (ref 0.0–0.1)
Basophils Relative: 0 %
Eosinophils Absolute: 0.1 10*3/uL (ref 0.0–0.5)
Eosinophils Relative: 1 %
HCT: 22.8 % — ABNORMAL LOW (ref 36.0–46.0)
Hemoglobin: 7.5 g/dL — ABNORMAL LOW (ref 12.0–15.0)
Immature Granulocytes: 1 %
Lymphocytes Relative: 8 %
Lymphs Abs: 1.2 10*3/uL (ref 0.7–4.0)
MCH: 28.5 pg (ref 26.0–34.0)
MCHC: 32.9 g/dL (ref 30.0–36.0)
MCV: 86.7 fL (ref 80.0–100.0)
Monocytes Absolute: 0.5 10*3/uL (ref 0.1–1.0)
Monocytes Relative: 3 %
Neutro Abs: 13.7 10*3/uL — ABNORMAL HIGH (ref 1.7–7.7)
Neutrophils Relative %: 87 %
Platelets: 407 10*3/uL — ABNORMAL HIGH (ref 150–400)
RBC: 2.63 MIL/uL — ABNORMAL LOW (ref 3.87–5.11)
RDW: 15 % (ref 11.5–15.5)
WBC: 15.7 10*3/uL — ABNORMAL HIGH (ref 4.0–10.5)
nRBC: 0.1 % (ref 0.0–0.2)

## 2021-03-16 LAB — GLUCOSE, CAPILLARY
Glucose-Capillary: 107 mg/dL — ABNORMAL HIGH (ref 70–99)
Glucose-Capillary: 92 mg/dL (ref 70–99)
Glucose-Capillary: 134 mg/dL — ABNORMAL HIGH (ref 70–99)
Glucose-Capillary: 108 mg/dL — ABNORMAL HIGH (ref 70–99)
Glucose-Capillary: 122 mg/dL — ABNORMAL HIGH (ref 70–99)
Glucose-Capillary: 306 mg/dL — ABNORMAL HIGH (ref 70–99)

## 2021-03-16 LAB — RENAL FUNCTION PANEL
Albumin: 2.7 g/dL — ABNORMAL LOW (ref 3.5–5.0)
Anion gap: 11 (ref 5–15)
BUN: 83 mg/dL — ABNORMAL HIGH (ref 8–23)
CO2: 21 mmol/L — ABNORMAL LOW (ref 22–32)
Calcium: 7.8 mg/dL — ABNORMAL LOW (ref 8.9–10.3)
Chloride: 108 mmol/L (ref 98–111)
Creatinine, Ser: 6.34 mg/dL — ABNORMAL HIGH (ref 0.44–1.00)
GFR, Estimated: 7 mL/min — ABNORMAL LOW (ref >60)
Glucose, Bld: 137 mg/dL — ABNORMAL HIGH (ref 70–99)
Phosphorus: 7 mg/dL — ABNORMAL HIGH (ref 2.5–4.6)
Potassium: 4.6 mmol/L (ref 3.5–5.1)
Sodium: 140 mmol/L (ref 135–145)

## 2021-03-16 SURGERY — CANCELLED PROCEDURE
Anesthesia: General

## 2021-03-16 MED ORDER — SODIUM CHLORIDE 0.9 % IV SOLN
INTRAVENOUS | Status: AC
  Filled 2021-03-16: qty 1.2

## 2021-03-16 MED ORDER — GLYCOPYRROLATE PF 0.2 MG/ML IJ SOSY
PREFILLED_SYRINGE | INTRAMUSCULAR | Status: AC
  Filled 2021-03-16: qty 2

## 2021-03-16 MED ORDER — HEPARIN SODIUM (PORCINE) 1000 UNIT/ML IJ SOLN
INTRAMUSCULAR | Status: AC
  Filled 2021-03-16: qty 1

## 2021-03-16 MED ORDER — LIDOCAINE 2% (20 MG/ML) 5 ML SYRINGE
INTRAMUSCULAR | Status: DC | PRN
  Administered 2021-03-16: 40 mg via INTRAVENOUS
  Administered 2021-03-16: 60 mg via INTRAVENOUS

## 2021-03-16 MED ORDER — GLYCOPYRROLATE 0.2 MG/ML IJ SOLN
INTRAMUSCULAR | Status: DC | PRN
  Administered 2021-03-16 (×2): .1 mg via INTRAVENOUS

## 2021-03-16 MED ORDER — ATROPINE SULFATE 0.4 MG/ML IJ SOLN
INTRAMUSCULAR | Status: DC | PRN
  Administered 2021-03-16: .2 mg via INTRAVENOUS
  Administered 2021-03-16: .4 mg via INTRAVENOUS
  Administered 2021-03-16: .2 mg via INTRAVENOUS

## 2021-03-16 MED ORDER — PROTAMINE SULFATE 10 MG/ML IV SOLN
INTRAVENOUS | Status: AC
  Filled 2021-03-16: qty 5

## 2021-03-16 MED ORDER — PROPOFOL 10 MG/ML IV BOLUS
INTRAVENOUS | Status: DC | PRN
  Administered 2021-03-16: 100 mg via INTRAVENOUS
  Administered 2021-03-16: 150 mg via INTRAVENOUS

## 2021-03-16 MED ORDER — LIDOCAINE HCL (PF) 1 % IJ SOLN
INTRAMUSCULAR | Status: AC
  Filled 2021-03-16: qty 30

## 2021-03-16 MED ORDER — DEXAMETHASONE SODIUM PHOSPHATE 10 MG/ML IJ SOLN
INTRAMUSCULAR | Status: AC
  Filled 2021-03-16: qty 1

## 2021-03-16 MED ORDER — 0.9 % SODIUM CHLORIDE (POUR BTL) OPTIME
TOPICAL | Status: DC | PRN
  Administered 2021-03-16: 1000 mL

## 2021-03-16 MED ORDER — EPINEPHRINE 1 MG/10ML IJ SOSY
PREFILLED_SYRINGE | INTRAMUSCULAR | Status: AC
  Filled 2021-03-16: qty 10

## 2021-03-16 MED ORDER — PAPAVERINE HCL 30 MG/ML IJ SOLN
INTRAMUSCULAR | Status: AC
  Filled 2021-03-16: qty 2

## 2021-03-16 MED ORDER — CHLORHEXIDINE GLUCONATE 0.12 % MT SOLN
15.0000 mL | Freq: Once | OROMUCOSAL | Status: AC
Start: 2021-03-16 — End: 2021-03-16
  Administered 2021-03-16: 15 mL via OROMUCOSAL

## 2021-03-16 MED ORDER — FENTANYL CITRATE (PF) 250 MCG/5ML IJ SOLN
INTRAMUSCULAR | Status: AC
  Filled 2021-03-16: qty 5

## 2021-03-16 MED ORDER — EPINEPHRINE 1 MG/10ML IJ SOSY
PREFILLED_SYRINGE | INTRAMUSCULAR | Status: DC | PRN
  Administered 2021-03-16: .02 mg via INTRAVENOUS
  Administered 2021-03-16: .03 mg via INTRAVENOUS

## 2021-03-16 MED ORDER — PROPOFOL 10 MG/ML IV BOLUS
INTRAVENOUS | Status: AC
  Filled 2021-03-16: qty 20

## 2021-03-16 MED ORDER — CALCIUM CHLORIDE 10 % IV SOLN
INTRAVENOUS | Status: DC | PRN
  Administered 2021-03-16: 1 g via INTRAVENOUS

## 2021-03-16 MED ORDER — FENTANYL CITRATE (PF) 100 MCG/2ML IJ SOLN
INTRAMUSCULAR | Status: DC | PRN
  Administered 2021-03-16: 25 ug via INTRAVENOUS

## 2021-03-16 MED ORDER — FUROSEMIDE 80 MG PO TABS
80.0000 mg | ORAL_TABLET | Freq: Every day | ORAL | Status: DC
  Administered 2021-03-17: 80 mg via ORAL
  Filled 2021-03-16: qty 1

## 2021-03-16 MED ORDER — SODIUM CHLORIDE 0.9 % IV SOLN
INTRAVENOUS | Status: DC | PRN
  Administered 2021-03-16: 500 mL

## 2021-03-16 MED ORDER — LIDOCAINE 2% (20 MG/ML) 5 ML SYRINGE
INTRAMUSCULAR | Status: AC
  Filled 2021-03-16: qty 10

## 2021-03-16 MED ORDER — LIDOCAINE-EPINEPHRINE 1 %-1:100000 IJ SOLN
INTRAMUSCULAR | Status: AC
  Filled 2021-03-16: qty 1

## 2021-03-16 MED ORDER — PHENYLEPHRINE 40 MCG/ML (10ML) SYRINGE FOR IV PUSH (FOR BLOOD PRESSURE SUPPORT)
PREFILLED_SYRINGE | INTRAVENOUS | Status: AC
  Filled 2021-03-16: qty 10

## 2021-03-16 MED ORDER — LIDOCAINE-EPINEPHRINE 1 %-1:100000 IJ SOLN
INTRAMUSCULAR | Status: DC | PRN
  Administered 2021-03-16: 20 mL

## 2021-03-16 MED ORDER — EPHEDRINE SULFATE 50 MG/ML IJ SOLN
INTRAMUSCULAR | Status: DC | PRN
  Administered 2021-03-16: 20 mg via INTRAVENOUS
  Administered 2021-03-16: 25 mg via INTRAVENOUS
  Administered 2021-03-16: 5 mg via INTRAVENOUS
  Administered 2021-03-16: 25 mg via INTRAVENOUS
  Administered 2021-03-16: 5 mg via INTRAVENOUS
  Administered 2021-03-16: 20 mg via INTRAVENOUS

## 2021-03-16 MED ORDER — ONDANSETRON HCL 4 MG/2ML IJ SOLN
INTRAMUSCULAR | Status: AC
  Filled 2021-03-16: qty 2

## 2021-03-16 MED ORDER — LACTATED RINGERS IV SOLN: INTRAVENOUS | Status: DC

## 2021-03-16 MED ORDER — EPHEDRINE 5 MG/ML INJ
INTRAVENOUS | Status: AC
  Filled 2021-03-16: qty 10

## 2021-03-16 MED ORDER — CHLORHEXIDINE GLUCONATE 0.12 % MT SOLN
OROMUCOSAL | Status: AC
  Filled 2021-03-16: qty 15

## 2021-03-16 MED ORDER — ORAL CARE MOUTH RINSE: 15.0000 mL | Freq: Once | OROMUCOSAL | Status: AC

## 2021-03-16 MED ORDER — EPHEDRINE 5 MG/ML INJ
INTRAVENOUS | Status: AC
  Filled 2021-03-16: qty 20

## 2021-03-16 SURGICAL SUPPLY — 35 items
ADH SKN CLS APL DERMABOND .7 (GAUZE/BANDAGES/DRESSINGS) ×1
ARMBAND PINK RESTRICT EXTREMIT (MISCELLANEOUS) ×3 IMPLANT
CANISTER SUCT 3000ML PPV (MISCELLANEOUS) ×3 IMPLANT
CANNULA VESSEL 3MM 2 BLNT TIP (CANNULA) ×7 IMPLANT
CLIP VESOCCLUDE MED 24/CT (CLIP) ×3 IMPLANT
CLIP VESOCCLUDE SM WIDE 24/CT (CLIP) ×3 IMPLANT
COVER PROBE W GEL 5X96 (DRAPES) ×2 IMPLANT
COVER WAND RF STERILE (DRAPES) ×1 IMPLANT
DECANTER SPIKE VIAL GLASS SM (MISCELLANEOUS) ×3 IMPLANT
DERMABOND ADVANCED (GAUZE/BANDAGES/DRESSINGS) ×1
DERMABOND ADVANCED .7 DNX12 (GAUZE/BANDAGES/DRESSINGS) ×2 IMPLANT
ELECT REM PT RETURN 9FT ADLT (ELECTROSURGICAL) ×2
ELECTRODE REM PT RTRN 9FT ADLT (ELECTROSURGICAL) ×2 IMPLANT
GLOVE BIO SURGEON STRL SZ7.5 (GLOVE) ×3 IMPLANT
GLOVE SRG 8 PF TXTR STRL LF DI (GLOVE) ×2 IMPLANT
GLOVE SURG UNDER POLY LF SZ8 (GLOVE) ×2
GOWN STRL REUS W/ TWL LRG LVL3 (GOWN DISPOSABLE) ×6 IMPLANT
GOWN STRL REUS W/TWL LRG LVL3 (GOWN DISPOSABLE) ×6
KIT BASIN OR (CUSTOM PROCEDURE TRAY) ×3 IMPLANT
KIT TURNOVER KIT B (KITS) ×3 IMPLANT
NEEDLE 22X1 1/2 (OR ONLY) (NEEDLE) ×2 IMPLANT
NS IRRIG 1000ML POUR BTL (IV SOLUTION) ×3 IMPLANT
PACK CV ACCESS (CUSTOM PROCEDURE TRAY) ×3 IMPLANT
PAD ARMBOARD 7.5X6 YLW CONV (MISCELLANEOUS) ×6 IMPLANT
SPONGE SURGIFOAM ABS GEL 100 (HEMOSTASIS) IMPLANT
SUT MNCRL AB 4-0 PS2 18 (SUTURE) ×4 IMPLANT
SUT PROLENE 6 0 BV (SUTURE) ×4 IMPLANT
SUT SILK 2 0 SH (SUTURE) IMPLANT
SUT VIC AB 3-0 SH 27 (SUTURE) ×2
SUT VIC AB 3-0 SH 27X BRD (SUTURE) ×3 IMPLANT
SYR 10ML LL (SYRINGE) ×2 IMPLANT
SYR 5ML LL (SYRINGE) ×2 IMPLANT
TOWEL GREEN STERILE (TOWEL DISPOSABLE) ×3 IMPLANT
UNDERPAD 30X36 HEAVY ABSORB (UNDERPADS AND DIAPERS) ×3 IMPLANT
WATER STERILE IRR 1000ML POUR (IV SOLUTION) ×3 IMPLANT

## 2021-03-16 NOTE — Anesthesia Procedure Notes (Signed)
Procedure Name: LMA Insertion Date/Time: 03/16/2021 11:50 AM Performed by: Lavonia Dana, CRNA Pre-anesthesia Checklist: Patient identified, Emergency Drugs available, Suction available and Patient being monitored Patient Re-evaluated:Patient Re-evaluated prior to induction Oxygen Delivery Method: Circle system utilized Preoxygenation: Pre-oxygenation with 100% oxygen Induction Type: IV induction Ventilation: Mask ventilation without difficulty LMA: LMA inserted LMA Size: 4.0 Number of attempts: 1 Airway Equipment and Method: Bite block Placement Confirmation: positive ETCO2 Tube secured with: Tape Dental Injury: Teeth and Oropharynx as per pre-operative assessment

## 2021-03-16 NOTE — Care Management Important Message (Signed)
Important Message  Patient Details  Name: Carolyn Russo MRN: [id number] Date of Birth: 07-18-1951   Medicare Important Message Given:  Yes     [name] 03/16/2021, 9:30 AM

## 2021-03-16 NOTE — Progress Notes (Signed)
Heart Failure Navigator Progress Note  Assessed for Heart & Vascular TOC clinic readiness.  Unfortunately at this time the patient does not meet criteria due to advanced CKD.   Navigator available for reassessment of patient.   Charleton Deyoung Boothby, PharmD, BCPS Heart Failure Stewardship Pharmacist Phone [phone]   

## 2021-03-16 NOTE — Progress Notes (Signed)
[facility] Progress Note    Assessment/ Plan:   1. AKI on CKD (Stage IV-V):  Minimal AKI component at this time and mostly just progressive CKD.    - Discussed with the patient and her husband that creatinine/BUN do continue to rise; no emergent need for HD.  Discussed need for continued close inpatient monitoring for the possible need for dialysis.  - Reduce lasix to 80 mg daily - she has already gotten this AM's dose   - VVS has seen the patient and planning for access creation on 5/24.  Appreciate their assistance  2. HFpEF exacerbation:  volume status improved.  Continue lasix - reduce to 80 mg daily   3. CAD - on coreg  4. HTN: -Continue current regimen aside from lasix adjustment   3. IDDM: - Continue management per primary team  5. Anemia CKD and iron deficiency  - has been receiving IV iron -  paused per patient request as had nausea and headache with the second dose  Disposition - getting an AVF; continue inpatient monitoring as Cr continues to rise .     Subjective:   Interim rise in creatinine.  Planning for AVF today.  Spoke with the patient and her husband at bedside and discussed worsening renal function.  She has an appetite and no metallic taste.  States leg swelling much better than on admission.   Review of systems:   denies n/v today  Npo for surgery Denies shortness of breath or chest pain    Objective:   BP (!) 141/74 (BP Location: Left Wrist)   Pulse 67   Temp 98.8 F (37.1 C) (Oral)   Resp 20   Ht 5\' 3"  (1.6 m)   Wt 96.3 kg   SpO2 97%   BMI 37.62 kg/m   Intake/Output Summary (Last 24 hours) at 03/16/2021 0743 Last data filed at 03/16/2021 0411 Gross per 24 hour  Intake 797 ml  Output 400 ml  Net 397 ml   Weight change: -0.544 kg  Physical Exam:    General adult female in bed in no acute distress HEENT normocephalic atraumatic extraocular movements intact sclera anicteric Neck supple trachea midline Lungs clear  anteriorly; normal work of breathing at rest; on 2 liters Heart S1S2 no rub Abdomen soft nontender nondistended Extremities trace to 1+ edema bilateral lower extremities Psych normal mood and affect Neuro - alert and oriented x 3 provides hx and follows commands   Imaging: No results found.  Labs: BMET Recent Labs  Lab 03/09/21 1435 03/09/21 2256 03/11/21 0309 03/11/21 1410 03/12/21 7829 03/13/21 0245 03/14/21 0159 03/15/21 0729 03/16/21 0332  NA 138 142 139  --  139 140 138 140 140  K 4.5 4.4 4.4  --  3.8 4.1 4.2 4.0 4.6  CL 108  --  110  --  108 108 105 107 108  CO2 22  --  20*  --  23 21* 21* 23 21*  GLUCOSE 295*  --  80  --  129* 95 143* 106* 137*  BUN 58*  --  65*  --  69* 72* 75* 77* 83*  CREATININE 5.35*  --  5.95*  --  5.67* 5.53* 5.63* 5.90* 6.34*  CALCIUM 8.8*  --  8.3*  --  8.1* 8.1* 7.8* 7.9* 7.8*  PHOS  --   --   --  5.7*  --   --   --   --  7.0*   CBC Recent Labs  Lab 03/09/21  1435 03/09/21 2256 03/11/21 1410 03/14/21 0159  WBC 9.7  --  8.1 9.7  NEUTROABS 6.4  --   --  5.1  HGB 7.4* 8.2* 7.5* 8.2*  HCT 22.8* 24.0* 22.8* 25.1*  MCV 86.4  --  86.4 86.6  PLT 398  --  421* 440*    Medications:    . aspirin  81 mg Oral Daily  . calcitRIOL  0.25 mcg Oral Once per day on Mon Wed Fri  . carvedilol  12.5 mg Oral BID WC  . fluticasone  2 spray Each Nare Daily  . furosemide  80 mg Oral BID  . heparin  5,000 Units Subcutaneous Q8H  . hydrALAZINE  25 mg Oral Q8H  . insulin aspart  0-5 Units Subcutaneous QHS  . insulin aspart  0-9 Units Subcutaneous TID WC  . insulin detemir  15 Units Subcutaneous BID  . mometasone-formoterol  2 puff Inhalation BID  . rosuvastatin  10 mg Oral Daily    Claudia Desanctis, MD  03/16/2021  8:12 AM

## 2021-03-16 NOTE — Interval H&P Note (Signed)
History and Physical Interval Note:  03/16/2021 10:57 AM  Carolyn Russo  has presented today for surgery, with the diagnosis of CHRONIC KIDNEY DISEASE STAGE V.  The various methods of treatment have been discussed with the patient and family. After consideration of risks, benefits and other options for treatment, the patient has consented to  Procedure(s): RIGHT FIRST STAGE [place] (Right) as a surgical intervention.  The patient's history has been reviewed, patient examined, no change in status, stable for surgery.  I have reviewed the patient's chart and labs.  Questions were answered to the patient's satisfaction.     Deitra Mayo

## 2021-03-16 NOTE — Anesthesia Preprocedure Evaluation (Addendum)
Anesthesia Evaluation  Patient identified by MRN, date of birth, ID band Patient awake    Reviewed: Allergy & Precautions, NPO status , Patient's Chart, lab work & pertinent test results  Airway Mallampati: III  TM Distance: >3 FB Neck ROM: Full    Dental no notable dental hx.    Pulmonary asthma ,    Pulmonary exam normal breath sounds clear to auscultation       Cardiovascular hypertension, Pt. on medications + CAD  Normal cardiovascular exam Rhythm:Regular Rate:Normal  ECHO: 1. Left ventricular ejection fraction, by estimation, is 50%. The left ventricle has low normal function. The left ventricle has no regional wall motion abnormalities. There is mild left ventricular hypertrophy. Left ventricular diastolic parameters are consistent with Grade II diastolic dysfunction (pseudonormalization). Elevated left ventricular end-diastolic pressure. The E/e' is 45. 2. Right ventricular systolic function is normal. The right ventricular size is normal. There is moderately elevated pulmonary artery systolic pressure. The estimated right ventricular systolic pressure is 21.3 mmHg. 3. Left atrial size was mildly dilated. 4. The mitral valve is grossly normal. Trivial mitral valve regurgitation. No evidence of mitral stenosis. 5. The aortic valve is grossly normal. There is mild calcification of the aortic valve. Aortic valve regurgitation is not visualized. Mild aortic valve sclerosis is present, with no evidence of aortic valve stenosis. 6. The inferior vena cava is dilated in size with >50% respiratory variability, suggesting right atrial pressure of 8 mmHg.   Neuro/Psych  Headaches, negative psych ROS   GI/Hepatic negative GI ROS, Neg liver ROS,   Endo/Other  diabetes, Insulin Dependent  Renal/GU CRFRenal disease     Musculoskeletal negative musculoskeletal ROS (+)   Abdominal (+) + obese,   Peds  Hematology  (+)  anemia , HLD   Anesthesia Other Findings CHRONIC KIDNEY DISEASE STAGE V  Reproductive/Obstetrics                            Anesthesia Physical Anesthesia Plan  ASA: III  Anesthesia Plan: General   Post-op Pain Management:    Induction: Intravenous  PONV Risk Score and Plan: 3 and Ondansetron, Dexamethasone and Treatment may vary due to age or medical condition  Airway Management Planned: LMA  Additional Equipment:   Intra-op Plan:   Post-operative Plan: Extubation in OR  Informed Consent: I have reviewed the patients History and Physical, chart, labs and discussed the procedure including the risks, benefits and alternatives for the proposed anesthesia with the patient or authorized representative who has indicated his/her understanding and acceptance.     Dental advisory given  Plan Discussed with: CRNA  Anesthesia Plan Comments:        Anesthesia Quick Evaluation

## 2021-03-16 NOTE — Progress Notes (Signed)
Progress Note  Patient Name: Carolyn Russo Date of Encounter: 03/16/2021  [facility] Cardiologist: Sinclair Grooms, MD   Subjective   Resting comfortably - husband by her side  Inpatient Medications    Scheduled Meds: . aspirin  81 mg Oral Daily  . calcitRIOL  0.25 mcg Oral Once per day on Mon Wed Fri  . carvedilol  12.5 mg Oral BID WC  . chlorhexidine      . fluticasone  2 spray Each Nare Daily  . [START ON 03/17/2021] furosemide  80 mg Oral Daily  . heparin  5,000 Units Subcutaneous Q8H  . hydrALAZINE  25 mg Oral Q8H  . insulin aspart  0-5 Units Subcutaneous QHS  . insulin aspart  0-9 Units Subcutaneous TID WC  . insulin detemir  15 Units Subcutaneous BID  . mometasone-formoterol  2 puff Inhalation BID  . rosuvastatin  10 mg Oral Daily   Continuous Infusions: . sodium chloride     PRN Meds: sodium chloride, acetaminophen, hydrALAZINE, ipratropium-albuterol, ondansetron (ZOFRAN) IV   Vital Signs    Vitals:   03/16/21 1034 03/16/21 1256 03/16/21 1308 03/16/21 1311  BP: (!) 136/57 (!) 166/90  (!) 154/87  Pulse: 64     Resp: 18 16  17   Temp: (!) 97.2 F (36.2 C) 97.9 F (36.6 C)    TempSrc: Oral     SpO2: 97% 100% 100%   Weight: 96.3 kg     Height: 5\' 3"  (1.6 m)       Intake/Output Summary (Last 24 hours) at 03/16/2021 1356 Last data filed at 03/16/2021 1240 Gross per 24 hour  Intake 737 ml  Output 401 ml  Net 336 ml   Last 3 Weights 03/16/2021 03/16/2021 03/15/2021  Weight (lbs) 212 lb 4.9 oz 212 lb 6.4 oz 213 lb 9.6 oz  Weight (kg) 96.3 kg 96.344 kg 96.888 kg      Telemetry    One episodes of 5 sec SVT at 150 otherwise SR - Personally Reviewed  ECG    SR with PACs and no acute changes - Personally Reviewed  Physical Exam   GEN: No acute distress.   Neck: No JVD Cardiac: RRR, no murmurs, rubs, or gallops.  Respiratory: Clear to auscultation bilaterally. GI: Soft, nontender, non-distended  MS: No edema; No deformity. Neuro:  Nonfocal   Psych: Normal affect   Labs    High Sensitivity Troponin:   Recent Labs  Lab 03/09/21 2110 03/09/21 2256  TROPONINIHS 51* 54*      Chemistry Recent Labs  Lab 03/09/21 1435 03/09/21 2256 03/14/21 0159 03/15/21 0729 03/16/21 0332  NA 138   < > 138 140 140  K 4.5   < > 4.2 4.0 4.6  CL 108   < > 105 107 108  CO2 22   < > 21* 23 21*  GLUCOSE 295*   < > 143* 106* 137*  BUN 58*   < > 75* 77* 83*  CREATININE 5.35*   < > 5.63* 5.90* 6.34*  CALCIUM 8.8*   < > 7.8* 7.9* 7.8*  PROT 6.3*  --   --   --   --   ALBUMIN 3.0*  --   --   --  2.7*  AST 27  --   --   --   --   ALT 49*  --   --   --   --   ALKPHOS 96  --   --   --   --  BILITOT 0.5  --   --   --   --   GFRNONAA 8*   < > 8* 7* 7*  ANIONGAP 8   < > 12 10 11    < > = values in this interval not displayed.     Hematology Recent Labs  Lab 03/09/21 1435 03/09/21 2256 03/11/21 1410 03/14/21 0159  WBC 9.7  --  8.1 9.7  RBC 2.64*  --  2.64* 2.90*  HGB 7.4* 8.2* 7.5* 8.2*  HCT 22.8* 24.0* 22.8* 25.1*  MCV 86.4  --  86.4 86.6  MCH 28.0  --  28.4 28.3  MCHC 32.5  --  32.9 32.7  RDW 14.7  --  14.7 14.8  PLT 398  --  421* 440*    BNP Recent Labs  Lab 03/09/21 1435  BNP 818.7*     DDimer No results for input(s): DDIMER in the last 168 hours.   Radiology    No results found.  Cardiac Studies   Cath 04/04/2017   Heavily calcified eccentric 30 to 40% proximal LAD.  Widely patent circumflex, ramus intermedius, and native right coronary.  Normal left ventricular size and function with EF 60%. Elevated left ventricular end-diastolic pressure consistent with diastolic heart failure.  RECOMMENDATIONS:   Aggressive risk factor modification  Blood pressure control  Weight loss  Consider sleep study to rule out sleep apnea (based upon airway obstruction noted during the procedure after receiving low-dose sedation).  Echocardiogram 03/11/21:  1. Left ventricular ejection fraction, by estimation, is  50%. The left  ventricle has low normal function. The left ventricle has no regional wall  motion abnormalities. There is mild left ventricular hypertrophy. Left  ventricular diastolic parameters are  consistent with Grade II diastolic dysfunction (pseudonormalization).  Elevated left ventricular end-diastolic pressure. The E/e' is 42.  2. Right ventricular systolic function is normal. The right ventricular  size is normal. There is moderately elevated pulmonary artery systolic  pressure. The estimated right ventricular systolic pressure is 08.6 mmHg.  3. Left atrial size was mildly dilated.  4. The mitral valve is grossly normal. Trivial mitral valve  regurgitation. No evidence of mitral stenosis.  5. The aortic valve is grossly normal. There is mild calcification of the  aortic valve. Aortic valve regurgitation is not visualized. Mild aortic  valve sclerosis is present, with no evidence of aortic valve stenosis.  6. The inferior vena cava is dilated in size with >50% respiratory  variability, suggesting right atrial pressure of 8 mmHg.   Patient Profile     70 y.o. female with a hx of hypertension, hyperlipidemia, DM 2, stage IV/V CKD, CAD and chronic anemiawho is being seen 5/19/2022for the evaluation of CHF exacerbationand improved.  Today with anesthesia had slow HR and case cancelled.  Per Dr. Scot Dock "patient became bradycardic to 30 and hypotensive to 60 systolic.  She had EKG changes of ST depression. This occurred approximately 20 minutes after induction" surgery was stopped and HR improved and BP with epinephrine/calcium and atropine.    Assessment & Plan    1.  Bradycardia leading to cancellation of surgery (was for 1st stage rt. basillic vein transposition.  EKG changes with ST depression.  No strips of rhythm,  Given epi.calcium and atropine.  Coreg was started this hospitalization and was increased from 6.25 to 12.5 BID on the 21st.   On tele HR 150 for 12 beats SVT.  Was not on BB prior to admit-- during night may have had lose tele  pads, some slow HR but also with artifact.  Will hold BB and resume tomorrow at lower dose if Dr. Harrell Gave agrees. Maybe 3.125 BID,   2. Acute on chronic diastolic CHF: -Echocardiogram 03/11/21 with LVEF at 50% with G2DD, moderately elevated PASP at 53.54mmHg.  -Felt that she will ultimately need HD given progresisve CKD and massive fluid volume overload.  -Nephrology agrees with IV Lasix for now with proposed plan for placement of permanent HD access while hospitalized however she needs to review this with her husband  -Continue po Lasix 80mg  BID -Weight, 211lb today with an admission weight at 220lb  -I&O, net negative 3.188L since admit  3. CAD: -Last LHC form 2018 with non-obstructive CAD -No anginal symptoms  -Continue ASA, carvedilol   4. HTN: -Felt to be in the setting of massive fluid volume overload>>to improve with volume removal  -Continue hydralazine   5. CKD stage IV: -Seen by nephrology -Creatinine on hospital presentation at 5.95>>5.67 >>6.34 today  -Follows with Dr. Johnney Ou OP   6. HLD: -Last LDL, 82 from 10/2019 -Continue statin      For questions or updates, please contact [place] Please consult [email or web address] for contact info under        Signed, Cecilie Kicks, NP  03/16/2021, 1:56 PM

## 2021-03-16 NOTE — Op Note (Signed)
    NAME: Carolyn Russo    MRN: [id number] DOB: 02-Apr-1951    DATE OF OPERATION: 03/16/2021  PREOP DIAGNOSIS:    Stage V chronic kidney disease  POSTOP DIAGNOSIS:    Same  PROCEDURE:    Exposure of right basilic vein  SURGEON: Judeth Cornfield. Scot Dock, MD  ASSIST: Liana Crocker, PA  ANESTHESIA: General  EBL: Minimal  INDICATIONS:    Carolyn Russo is a 70 y.o. female with stage V chronic kidney disease.  Were asked to place access.  It looks like her best option for a fistula was a basilic vein transposition on the right.  Our plan was for a for stage basilic vein transposition on the right.  FINDINGS:   The basilic vein was 3 mm in diameter.  During exposure of the vein the patient became bradycardic and hypotensive with EKG changes.  We therefore elected to stop at this point.  TECHNIQUE:   The patient was taken to the operating room and received a general anesthetic.  I looked at the basilic vein myself with the SonoSite and I felt that it would be reasonable to do at least a for stage basilic vein transposition.  The right arm was prepped and draped in usual sterile fashion.  An incision was made over the basilic vein and the vein was dissected free just above the antecubital level and separated from the nerve.  At this point the patient became bradycardic to 30 and hypotensive to 60 systolic.  She had EKG changes.  This occurred approximately 20 minutes after induction.  Given this sudden change I felt that it was be safest to stop at this point.  The wound was closed with a deep layer of 3-0 Vicryl and the skin closed with 4-0 Monocryl.  Her heart rate improved and her blood pressure improved.  Once this had occurred her EKG changes resolved.  We will obtain an EKG in the recovery room and have also discussed this with her medical doctors who will look at her in recovery room.  Cardiology has been following the patient and they will likely ask them to follow-up.  I have updated  the patient's husband Dr. [name].  Given the complexity of the case a first assistant was necessary in order to expedient the procedure and safely perform the technical aspects of the operation.  Deitra Mayo, MD, FACS [facility]  DATE OF DICTATION:   03/16/2021

## 2021-03-16 NOTE — Transfer of Care (Addendum)
Immediate Anesthesia Transfer of Care Note  Patient: Carolyn Russo  Procedure(s) Performed: CANCELLED PROCEDURE. Procedure aborted shortly after incision due to extreme bradycardic event. (Right Arm Upper)  Patient Location: PACU  Anesthesia Type:General  Level of Consciousness: awake and drowsy, Follows commands, MOE x4  Airway & Oxygen Therapy: Patient Spontanous Breathing and Patient connected to face mask oxygen  Post-op Assessment: Report given to RN and Post -op Vital signs reviewed and stable  Post vital signs: Reviewed and stable  Last Vitals:  Vitals Value Taken Time  BP 166/90 03/16/21 1301  Temp    Pulse 76 03/16/21 1301  Resp 12 03/16/21 1303  SpO2 100 % 03/16/21 1301  Vitals shown include unvalidated device data.  Last Pain:  Vitals:   03/16/21 1034  TempSrc: Oral  PainSc:       Patients Stated Pain Goal: 0 (48/59/27 [id number])  Complications: No complications documented.

## 2021-03-16 NOTE — Anesthesia Postprocedure Evaluation (Signed)
Anesthesia Post Note  Patient: Carolyn Russo  Procedure(s) Performed: CANCELLED PROCEDURE. Procedure aborted shortly after incision due to extreme bradycardic event. (Right Arm Upper)     Patient location during evaluation: PACU Anesthesia Type: General Level of consciousness: awake Pain management: pain level controlled Vital Signs Assessment: post-procedure vital signs reviewed and stable Respiratory status: spontaneous breathing, nonlabored ventilation, respiratory function stable and patient connected to nasal cannula oxygen Cardiovascular status: blood pressure returned to baseline and stable Postop Assessment: no apparent nausea or vomiting Anesthetic complications: yes Comments: Patient with an episode of refractory bradycardia and hypotension in the OR which resulted in the termination of surgery. Patient later accessed in the PACU and on the floor in room 3E14 and found to be stable.     No complications documented.  Last Vitals:  Vitals:   03/16/21 1915 03/16/21 2047  BP:  (!) 167/69  Pulse:  79  Resp:  16  Temp:  (!) 36.3 C  SpO2: 93% 98%    Last Pain:  Vitals:   03/16/21 2047  TempSrc: Oral  PainSc:                  Karyl Kinnier Cimone Fahey

## 2021-03-16 NOTE — Progress Notes (Signed)
PROGRESS NOTE    Carolyn Russo  KVQ:[id number] DOB: 1951-09-01 DOA: 03/09/2021 PCP: [name], MD    Chief Complaint  Patient presents with  . Low Hemoglobin   . Leg Swelling    Brief Narrative: 70 year old lady prior history of stage IV CKD, insulin-dependent diabetes mellitus, coronary artery disease, hypertension presents to ED for worsening sob, bilateral lower extremity swelling. Chest x-ray with cardiomegaly, vascular congestion, possible left pleural effusion, and indeterminate bilateral hilar fullness.  Chemistry panel features a creatinine of 5.35 and glucose 295.  CBC notable for hemoglobin of 7.4.  High-sensitivity troponin was 51 then 54.  BNP elevated to 819.  Patient was given 80 mg IV Lasix, nitroglycerin, admitted for acute on chronic diastolic heart failure. Cardiology consulted,  Recommendations given and transitioned to oral lasix 80 mg BID.  Vascular surgery consulted for access for HD. She is scheduled for  first stage right basilic vein transposition today, but prior to the procedure after she got the general anesthesia, she became bradycardic with HR in low 30's, and hypotensive with BP 70/40'S was given epinephrine./ calcium IV and atropine and her vitals normalized.  On exam she is lethargic with garbled speech, but oriented to place and person, without any focal deficits. Spoke to Dr Scot Dock with vascular and called cardiology evaluation.   Assessment & Plan:   Principal Problem:   Acute on chronic diastolic CHF (congestive heart failure) (HCC) Active Problems:   Type 2 diabetes mellitus (HCC)   CAD (coronary artery disease), native coronary artery   Asthma   Anemia in chronic kidney disease   CKD (chronic kidney disease), stage V (HCC)   Hypertensive urgency   Acute on chronic diastolic CHF:  BNP is 643, CXR shows CHF, pedal edema, sob and hypoxia requiring 2lit of [place] oxygen on admission. Echocardiogram showed Left ventricular ejection fraction, by  estimation, is 50%, with low normal function, no regional wall motion abnormalities. There is mild left ventricular hypertrophy and grade 2 diastolic dysfunction.  Initially started on IV lasix 60 gm BID transitioned to lasix 80 mg BID later on decreased the dose of lasix to 80 mg daily as her creatinine bumped up to 6.3. diuresed about 4.5lit since admission. Urine output only about 400 ml in the last 24 hours.  Continue with strict intake and output/ daily weights.  Creatinine worsened from 5.3 to 5.9 to 6.3 . potassium wnl.   She is back on oxygen, sob and reports feeling miserable.     Acute on Stage 4 CKD:  Baseline creatinine around 3, worsened to 5 in 01/2021, saw Dr Johnney Ou as outpatient, . Presented with a creatinine of 5.35 , worsened to 5.95 to 6.34 today.  Nephrology consulted and recommendations given.  She is not uremic, no urgent need for HD.  Vascular surgery consulted for vascular access for HD . She was scheduled for  first stage right basilic vein transposition today, but prior to the procedure after she got the general anesthesia, she became bradycardic with HR in low 30's, and hypotensive with BP 70/40'S was given epinephrine./ calcium IV and atropine and her vitals normalized. Discussed with  Dr Scot Dock with vascular and called cardiology evaluation.  Recheck  A 12 lead EKG.     Insulin dependent DM A1C IS 6.2%, well controlled CBG'S CBG (last 3)  Recent Labs    03/16/21 0604 03/16/21 1032 03/16/21 1258  GLUCAP 107* 92 134*   Resume SSI and continue with Levemir. No changes in meds.  Hypertensive Urgency:  Resolved.  BP parameters are optimal.  Resume hydralazine 25 mg TID,  COREG 12.5 MG bid.     CAD:  Continue with aspirin and statin. PT denies any chest pain or sob.    Anemia of chronic disease sec to stage 4 CKD.  Transfuse to keep hemoglobin greater than 7.  Low iron stores, iron infusion given. Hemoglobin stable around 8.2. .     Asthma;   No wheezing or rhonchi.  Continue with bronchodilators.    Hyperlipidemia:  Resume crestor.   Deconditioning:  Will get Therapy evaluations.    DVT prophylaxis: heparin.  Code Status: (Full code.  Family Communication: none at bedside.  Disposition:   Status is: Inpatient  Remains inpatient appropriate because:Ongoing diagnostic testing needed not appropriate for outpatient work up and IV treatments appropriate due to intensity of illness or inability to take PO   Dispo: The patient is from: Home              Anticipated d/c is to: Home              Patient currently is not medically stable to d/c.   Difficult to place patient No       Consultants:   Cardiology  Nephrology   Procedures: Echocardiogram   Antimicrobials: none.    Subjective: Lethargic, but able to answer all questions.  Objective: Vitals:   03/16/21 1034 03/16/21 1256 03/16/21 1308 03/16/21 1311  BP: (!) 136/57 (!) 166/90  (!) 154/87  Pulse: 64     Resp: 18 16  17   Temp: (!) 97.2 F (36.2 C) 97.9 F (36.6 C)    TempSrc: Oral     SpO2: 97% 100% 100%   Weight: 96.3 kg     Height: 5\' 3"  (1.6 m)       Intake/Output Summary (Last 24 hours) at 03/16/2021 1347 Last data filed at 03/16/2021 1240 Gross per 24 hour  Intake 737 ml  Output 401 ml  Net 336 ml   Filed Weights   03/15/21 0520 03/16/21 0355 03/16/21 1034  Weight: 96.9 kg 96.3 kg 96.3 kg    Examination:  General exam: elderly woman , on 2l it of [place] oxygen, not in distress.  Respiratory system: air entry fair bilateral, no wheezing heard, no tachypnea, on 2 lit of [place] oxygen.  Cardiovascular system: S1S2, RRR, no JVD, bilateral leg edema present.  Gastrointestinal system: Abdomen is soft , NT ND BS+ Central nervous system: lethargic, but able to answer all questions, no sensory or motor deficits, Extremities: bilateral leg edema present.  Skin: No rashes seen.  Psychiatry: Mood is appropriate   Data Reviewed: I have  personally reviewed following labs and imaging studies  CBC: Recent Labs  Lab 03/09/21 1435 03/09/21 2256 03/11/21 1410 03/14/21 0159  WBC 9.7  --  8.1 9.7  NEUTROABS 6.4  --   --  5.1  HGB 7.4* 8.2* 7.5* 8.2*  HCT 22.8* 24.0* 22.8* 25.1*  MCV 86.4  --  86.4 86.6  PLT 398  --  421* 440*    Basic Metabolic Panel: Recent Labs  Lab 03/11/21 1410 03/12/21 0621 03/13/21 0245 03/14/21 0159 03/15/21 0729 03/16/21 0332  NA  --  139 140 138 140 140  K  --  3.8 4.1 4.2 4.0 4.6  CL  --  108 108 105 107 108  CO2  --  23 21* 21* 23 21*  GLUCOSE  --  129* 95 143* 106* 137*  BUN  --  69* 72* 75* 77* 83*  CREATININE  --  5.67* 5.53* 5.63* 5.90* 6.34*  CALCIUM  --  8.1* 8.1* 7.8* 7.9* 7.8*  MG 2.0  --   --   --   --   --   PHOS 5.7*  --   --   --   --  7.0*    GFR: Estimated Creatinine Clearance: 8.9 mL/min (A) (by C-G formula based on SCr of 6.34 mg/dL (H)).  Liver Function Tests: Recent Labs  Lab 03/09/21 1435 03/16/21 0332  AST 27  --   ALT 49*  --   ALKPHOS 96  --   BILITOT 0.5  --   PROT 6.3*  --   ALBUMIN 3.0* 2.7*    CBG: Recent Labs  Lab 03/15/21 1626 03/15/21 2115 03/16/21 0604 03/16/21 1032 03/16/21 1258  GLUCAP 132* 198* 107* 92 134*     Recent Results (from the past 240 hour(s))  Resp Panel by RT-PCR (Flu A&B, Covid) Nasopharyngeal Swab     Status: None   Collection Time: 03/09/21  9:28 PM   Specimen: Nasopharyngeal Swab; Nasopharyngeal(NP) swabs in vial transport medium  Result Value Ref Range Status   SARS Coronavirus 2 by RT PCR NEGATIVE NEGATIVE Final    Comment: (NOTE) SARS-CoV-2 target nucleic acids are NOT DETECTED.  The SARS-CoV-2 RNA is generally detectable in upper respiratory specimens during the acute phase of infection. The lowest concentration of SARS-CoV-2 viral copies this assay can detect is 138 copies/mL. A negative result does not preclude SARS-Cov-2 infection and should not be used as the sole basis for treatment or other  patient management decisions. A negative result may occur with  improper specimen collection/handling, submission of specimen other than nasopharyngeal swab, presence of viral mutation(s) within the areas targeted by this assay, and inadequate number of viral copies(<138 copies/mL). A negative result must be combined with clinical observations, patient history, and epidemiological information. The expected result is Negative.  Fact Sheet for Patients:  EntrepreneurPulse.com.au  Fact Sheet for Healthcare Providers:  IncredibleEmployment.be  This test is no t yet approved or cleared by the [place] FDA and  has been authorized for detection and/or diagnosis of SARS-CoV-2 by FDA under an Emergency Use Authorization (EUA). This EUA will remain  in effect (meaning this test can be used) for the duration of the COVID-19 declaration under Section 564(b)(1) of the Act, 21 U.S.C.section 360bbb-3(b)(1), unless the authorization is terminated  or revoked sooner.       Influenza A by PCR NEGATIVE NEGATIVE Final   Influenza B by PCR NEGATIVE NEGATIVE Final    Comment: (NOTE) The Xpert Xpress SARS-CoV-2/FLU/RSV plus assay is intended as an aid in the diagnosis of influenza from Nasopharyngeal swab specimens and should not be used as a sole basis for treatment. Nasal washings and aspirates are unacceptable for Xpert Xpress SARS-CoV-2/FLU/RSV testing.  Fact Sheet for Patients: EntrepreneurPulse.com.au  Fact Sheet for Healthcare Providers: IncredibleEmployment.be  This test is not yet approved or cleared by the [place] FDA and has been authorized for detection and/or diagnosis of SARS-CoV-2 by FDA under an Emergency Use Authorization (EUA). This EUA will remain in effect (meaning this test can be used) for the duration of the COVID-19 declaration under Section 564(b)(1) of the Act, 21 U.S.C. section  360bbb-3(b)(1), unless the authorization is terminated or revoked.  Performed at [facility], [place] [address], [place], [place] [address]  Radiology Studies: No results found.      Scheduled Meds: . aspirin  81 mg Oral Daily  . calcitRIOL  0.25 mcg Oral Once per day on Mon Wed Fri  . carvedilol  12.5 mg Oral BID WC  . chlorhexidine      . fluticasone  2 spray Each Nare Daily  . [START ON 03/17/2021] furosemide  80 mg Oral Daily  . heparin  5,000 Units Subcutaneous Q8H  . hydrALAZINE  25 mg Oral Q8H  . insulin aspart  0-5 Units Subcutaneous QHS  . insulin aspart  0-9 Units Subcutaneous TID WC  . insulin detemir  15 Units Subcutaneous BID  . mometasone-formoterol  2 puff Inhalation BID  . rosuvastatin  10 mg Oral Daily   Continuous Infusions: . sodium chloride       LOS: 7 days        Hosie Poisson, MD [facility]   To contact the attending provider between 7A-7P or the covering provider during after hours 7P-7A, please log into the web site [email or web address] and access using universal [place] password for that web site. If you do not have the password, please call the hospital operator.  03/16/2021, 1:47 PM

## 2021-03-17 DIAGNOSIS — R001 Bradycardia, unspecified: Secondary | ICD-10-CM | POA: Diagnosis not present

## 2021-03-17 DIAGNOSIS — I2510 Atherosclerotic heart disease of native coronary artery without angina pectoris: Secondary | ICD-10-CM | POA: Diagnosis not present

## 2021-03-17 DIAGNOSIS — N185 Chronic kidney disease, stage 5: Secondary | ICD-10-CM | POA: Diagnosis not present

## 2021-03-17 DIAGNOSIS — I5033 Acute on chronic diastolic (congestive) heart failure: Secondary | ICD-10-CM | POA: Diagnosis not present

## 2021-03-17 LAB — RENAL FUNCTION PANEL
Albumin: 3.2 g/dL — ABNORMAL LOW (ref 3.5–5.0)
Anion gap: 14 (ref 5–15)
BUN: 89 mg/dL — ABNORMAL HIGH (ref 8–23)
CO2: 19 mmol/L — ABNORMAL LOW (ref 22–32)
Calcium: 8.7 mg/dL — ABNORMAL LOW (ref 8.9–10.3)
Chloride: 104 mmol/L (ref 98–111)
Creatinine, Ser: 6.84 mg/dL — ABNORMAL HIGH (ref 0.44–1.00)
GFR, Estimated: 6 mL/min — ABNORMAL LOW (ref >60)
Glucose, Bld: 184 mg/dL — ABNORMAL HIGH (ref 70–99)
Phosphorus: 7.1 mg/dL — ABNORMAL HIGH (ref 2.5–4.6)
Potassium: 4.5 mmol/L (ref 3.5–5.1)
Sodium: 137 mmol/L (ref 135–145)

## 2021-03-17 LAB — CBC WITH DIFFERENTIAL/PLATELET
Abs Immature Granulocytes: 0.1 10*3/uL — ABNORMAL HIGH (ref 0.00–0.07)
Basophils Absolute: 0 10*3/uL (ref 0.0–0.1)
Basophils Relative: 0 %
Eosinophils Absolute: 0 10*3/uL (ref 0.0–0.5)
Eosinophils Relative: 0 %
HCT: 24.7 % — ABNORMAL LOW (ref 36.0–46.0)
Hemoglobin: 8.2 g/dL — ABNORMAL LOW (ref 12.0–15.0)
Immature Granulocytes: 1 %
Lymphocytes Relative: 9 %
Lymphs Abs: 1.4 10*3/uL (ref 0.7–4.0)
MCH: 28.6 pg (ref 26.0–34.0)
MCHC: 33.2 g/dL (ref 30.0–36.0)
MCV: 86.1 fL (ref 80.0–100.0)
Monocytes Absolute: 1.3 10*3/uL — ABNORMAL HIGH (ref 0.1–1.0)
Monocytes Relative: 9 %
Neutro Abs: 12.3 10*3/uL — ABNORMAL HIGH (ref 1.7–7.7)
Neutrophils Relative %: 81 %
Platelets: 440 10*3/uL — ABNORMAL HIGH (ref 150–400)
RBC: 2.87 MIL/uL — ABNORMAL LOW (ref 3.87–5.11)
RDW: 14.9 % (ref 11.5–15.5)
WBC: 15.1 10*3/uL — ABNORMAL HIGH (ref 4.0–10.5)
nRBC: 0 % (ref 0.0–0.2)

## 2021-03-17 LAB — GLUCOSE, CAPILLARY
Glucose-Capillary: 219 mg/dL — ABNORMAL HIGH (ref 70–99)
Glucose-Capillary: 174 mg/dL — ABNORMAL HIGH (ref 70–99)
Glucose-Capillary: 220 mg/dL — ABNORMAL HIGH (ref 70–99)
Glucose-Capillary: 164 mg/dL — ABNORMAL HIGH (ref 70–99)

## 2021-03-17 MED ORDER — SEVELAMER CARBONATE 800 MG PO TABS
800.0000 mg | ORAL_TABLET | Freq: Three times a day (TID) | ORAL | Status: DC
  Administered 2021-03-17 – 2021-03-24 (×16): 800 mg via ORAL
  Filled 2021-03-17 (×16): qty 1

## 2021-03-17 MED ORDER — POLYSACCHARIDE IRON COMPLEX 150 MG PO CAPS
150.0000 mg | ORAL_CAPSULE | Freq: Every day | ORAL | Status: DC
  Administered 2021-03-17 – 2021-03-24 (×8): 150 mg via ORAL
  Filled 2021-03-17 (×8): qty 1

## 2021-03-17 MED ORDER — CHLORHEXIDINE GLUCONATE CLOTH 2 % EX PADS: 6.0000 | MEDICATED_PAD | Freq: Every day | CUTANEOUS | Status: DC

## 2021-03-17 MED ORDER — INSULIN DETEMIR 100 UNIT/ML ~~LOC~~ SOLN
8.0000 [IU] | Freq: Two times a day (BID) | SUBCUTANEOUS | Status: DC
Start: 2021-03-17 — End: 2021-03-19
  Administered 2021-03-17 – 2021-03-18 (×3): 8 [IU] via SUBCUTANEOUS
  Filled 2021-03-17 (×5): qty 0.08

## 2021-03-17 MED ORDER — FUROSEMIDE 10 MG/ML IJ SOLN: 80.0000 mg | Freq: Once | INTRAMUSCULAR | Status: DC

## 2021-03-17 MED ORDER — CARVEDILOL 3.125 MG PO TABS
3.1250 mg | ORAL_TABLET | Freq: Two times a day (BID) | ORAL | Status: DC
  Administered 2021-03-17 – 2021-03-23 (×11): 3.125 mg via ORAL
  Filled 2021-03-17 (×12): qty 1

## 2021-03-17 NOTE — Progress Notes (Signed)
Progress Note  Patient Name: Carolyn Russo Date of Encounter: 03/17/2021  [facility] Cardiologist: Belva Crome III, MD   Subjective   No acute events overnight. Nephrology planning to place tunneled catheter via IR and start dialysis. Husband present in room, son on phone.   Inpatient Medications    Scheduled Meds: . aspirin  81 mg Oral Daily  . calcitRIOL  0.25 mcg Oral Once per day on Mon Wed Fri  . carvedilol  3.125 mg Oral BID WC  . fluticasone  2 spray Each Nare Daily  . furosemide  80 mg Intravenous Once  . heparin  5,000 Units Subcutaneous Q8H  . hydrALAZINE  25 mg Oral Q8H  . insulin aspart  0-5 Units Subcutaneous QHS  . insulin aspart  0-9 Units Subcutaneous TID WC  . insulin detemir  15 Units Subcutaneous BID  . mometasone-formoterol  2 puff Inhalation BID  . rosuvastatin  10 mg Oral Daily   Continuous Infusions: . sodium chloride     PRN Meds: sodium chloride, acetaminophen, hydrALAZINE, ipratropium-albuterol, ondansetron (ZOFRAN) IV   Vital Signs    Vitals:   03/17/21 0452 03/17/21 0511 03/17/21 0903 03/17/21 0916  BP:  (!) 171/79 (!) 147/67   Pulse:  79 74   Resp:  20 18   Temp:  (!) 97.4 F (36.3 C)    TempSrc:  Oral    SpO2:  97% 99% 98%  Weight: 97.1 kg     Height:        Intake/Output Summary (Last 24 hours) at 03/17/2021 1138 Last data filed at 03/17/2021 0900 Gross per 24 hour  Intake 1520 ml  Output 1 ml  Net 1519 ml   Last 3 Weights 03/17/2021 03/17/2021 03/16/2021  Weight (lbs) 214 lb 214 lb 212 lb 4.9 oz  Weight (kg) 97.07 kg 97.07 kg 96.3 kg      Telemetry    NSR - Personally Reviewed  ECG    No new since 5/24 - Personally Reviewed  Physical Exam   GEN: No acute distress.   Neck: JVD difficult to assess due to body habitus Cardiac: RRR, no murmurs, rubs, or gallops.  Respiratory: Clear to auscultation bilaterally. GI: Soft, nontender, non-distended  MS: Trivial bilateral LE edema; No deformity. Neuro:  Nonfocal   Psych: Normal affect   Labs    High Sensitivity Troponin:   Recent Labs  Lab 03/09/21 2110 03/09/21 2256  TROPONINIHS 51* 54*      Chemistry Recent Labs  Lab 03/15/21 0729 03/16/21 0332 03/17/21 0753  NA 140 140 137  K 4.0 4.6 4.5  CL 107 108 104  CO2 23 21* 19*  GLUCOSE 106* 137* 184*  BUN 77* 83* 89*  CREATININE 5.90* 6.34* 6.84*  CALCIUM 7.9* 7.8* 8.7*  ALBUMIN  --  2.7* 3.2*  GFRNONAA 7* 7* 6*  ANIONGAP 10 11 14      Hematology Recent Labs  Lab 03/14/21 0159 03/16/21 1443 03/17/21 0753  WBC 9.7 15.7* 15.1*  RBC 2.90* 2.63* 2.87*  HGB 8.2* 7.5* 8.2*  HCT 25.1* 22.8* 24.7*  MCV 86.6 86.7 86.1  MCH 28.3 28.5 28.6  MCHC 32.7 32.9 33.2  RDW 14.8 15.0 14.9  PLT 440* 407* 440*    BNPNo results for input(s): BNP, PROBNP in the last 168 hours.   DDimer No results for input(s): DDIMER in the last 168 hours.   Radiology    No results found.  Cardiac Studies   Cath 04/04/2017   Heavily  calcified eccentric 30 to 40% proximal LAD.  Widely patent circumflex, ramus intermedius, and native right coronary.  Normal left ventricular size and function with EF 60%. Elevated left ventricular end-diastolic pressure consistent with diastolic heart failure.  RECOMMENDATIONS:   Aggressive risk factor modification  Blood pressure control  Weight loss  Consider sleep study to rule out sleep apnea (based upon airway obstruction noted during the procedure after receiving low-dose sedation).  Echocardiogram 03/11/21:  1. Left ventricular ejection fraction, by estimation, is 50%. The left  ventricle has low normal function. The left ventricle has no regional wall  motion abnormalities. There is mild left ventricular hypertrophy. Left  ventricular diastolic parameters are  consistent with Grade II diastolic dysfunction (pseudonormalization).  Elevated left ventricular end-diastolic pressure. The E/e' is 30.  2. Right ventricular systolic function is  normal. The right ventricular  size is normal. There is moderately elevated pulmonary artery systolic  pressure. The estimated right ventricular systolic pressure is 76.1 mmHg.  3. Left atrial size was mildly dilated.  4. The mitral valve is grossly normal. Trivial mitral valve  regurgitation. No evidence of mitral stenosis.  5. The aortic valve is grossly normal. There is mild calcification of the  aortic valve. Aortic valve regurgitation is not visualized. Mild aortic  valve sclerosis is present, with no evidence of aortic valve stenosis.  6. The inferior vena cava is dilated in size with >50% respiratory  variability, suggesting right atrial pressure of 8 mmHg.   Patient Profile     70 y.o. female with PMH advanced kidney disease (CKD 5, planning for dialysis access), hypertension, hyperlipidemia, type II diabetes, CAD. We are re-consulted for episode of bradycardia and hypotension in OR 03/16/21.  Assessment & Plan    Episode of bradycardia and hypotension in OR (no telemetry strips available) -no pauses or bradycardia on telemetry since that time, ECG at her baseline -she received epinephrine, calcium, and atropine in OR per report with improvement. Reported to have ST depressions during event as well -holding beta blocker last evening. Will restart at low dose today (carvedilol 3.125 mg BID)  Acute on chronic diastolic heart failure, chronic kidney disease stage 5:  -volume status to be managed by dialysis going forward -lasix per nephrology in the interim  CAD:  -nonobstructive on prior cath -continue aspirin, statin -restarting low dose beta blocker as above -denies symptoms  Hypercholesterolemia: -continue rosuvastatin 10 mg, max dose given renal function  Hypertension: -continue hydralazine -carvedilol as above -elevated numbers likely partially due to volume status. If continues to rise, would start amlodipine  For questions or updates, please contact [place] Please consult [email or web address] for contact info under     Signed, Buford Dresser, MD  03/17/2021, 11:38 AM

## 2021-03-17 NOTE — Progress Notes (Addendum)
PROGRESS NOTE   Carolyn Russo  WIO:[id number] DOB: 1951-02-27 DOA: 03/09/2021 PCP: [name], MD  Brief Narrative:  58 community dwelling white female CKD 4 follows Dr. Cherlyn Cushing baseline creatinine 4.09--previously has refused dialysis-referred to [facility] for kidney transplant eval IDDM IDA HTN Asthma CAD-cardiac cath 61218---30% LAD stenosis  Developed SOB 03/09/2021-CXR pleural effusions-Rx Lasix 60 twice daily Became bradycardic hypotensive on attempt at right basilic vein exposure Cardiology reconsulted  Hospital-Problem based course  Perioperative bradycardia hypotension 5/24 Received calcium adrenaline atropine No further issues on monitors Currently sinus rhythm on monitors-appreciate cardiology follow-up Continue Coreg 3.125 Declared end-stage renal disease Diuresis limited by rising creatinine earlier in hospital stay Nephrology planning for dialysis initiation 5/25-tunneled catheter to be placed per IR Defer diuresis to nephrologist Start Renvela 800 3 times daily Iron 43 sat ratios 14-received IV iron but stopped 2/2 nausea? Leukocytosis Etiology is unclear at this time-she is not immunocompromised or on chronic steroids but does have renal disease No fever-monitor and repeat labs a.m. If develops fever will need to be pancultured HFpEF Cardiac cath 61218--40% LAD stenosis no intervention at that time See above discussion Hydralazine 25 every 8, resumed Coreg 3.1255/25 As needed hydralazine IV if above 180 For now discontinue Diovan HCT Insulin-dependent DM TY 2 A1c 6.2 CBG 174-219--eating 100% of meals Continue Levemir 15, sliding scale 70/30 insulin on hold Nighttime snack ordered at request of pt   DVT prophylaxis: Heparin Code Status: Full Family Communication: Husband Dr. [name] at bedside updated in full Disposition:  Status is: Inpatient  Remains inpatient appropriate because:Hemodynamically unstable, Persistent severe electrolyte  disturbances and Ongoing diagnostic testing needed not appropriate for outpatient work up   Dispo: The patient is from: Home              Anticipated d/c is to: Home              Patient currently is not medically stable to d/c.   Difficult to place patient No  Consultants:   Cardiology, nephrology  Procedures: none  Antimicrobials: no    Subjective: Doing fair Laughing and joking No chest pain no fever no chills mild lower extremity edema not using oxygen  Objective: Vitals:   03/17/21 0452 03/17/21 0511 03/17/21 0903 03/17/21 0916  BP:  (!) 171/79 (!) 147/67   Pulse:  79 74   Resp:  20 18   Temp:  (!) 97.4 F (36.3 C)    TempSrc:  Oral    SpO2:  97% 99% 98%  Weight: 97.1 kg     Height:        Intake/Output Summary (Last 24 hours) at 03/17/2021 1223 Last data filed at 03/17/2021 0900 Gross per 24 hour  Intake 1520 ml  Output 1 ml  Net 1519 ml   Filed Weights   03/16/21 1034 03/17/21 0034 03/17/21 0452  Weight: 96.3 kg 97.1 kg 97.1 kg    Examination:  Awake coherent Mallampati 4 Mild JVD at 30 degrees S1-S2 no murmur Normal sinus rhythm Abdomen obese nontender no rebound no guarding Neurologically intact moving all 4 limbs  Data Reviewed: personally reviewed   CBC    Component Value Date/Time   WBC 15.1 (H) 03/17/2021 0753   RBC 2.87 (L) 03/17/2021 0753   HGB 8.2 (L) 03/17/2021 0753   HGB 8.0 (L) 02/17/2021 1146   HCT 24.7 (L) 03/17/2021 0753   HCT 24.0 (L) 02/17/2021 1146   PLT 440 (H) 03/17/2021 0753   PLT 376 02/17/2021 1146  MCV 86.1 03/17/2021 0753   MCV 85 02/17/2021 1146   MCH 28.6 03/17/2021 0753   MCHC 33.2 03/17/2021 0753   RDW 14.9 03/17/2021 0753   RDW 13.4 02/17/2021 1146   LYMPHSABS 1.4 03/17/2021 0753   MONOABS 1.3 (H) 03/17/2021 0753   EOSABS 0.0 03/17/2021 0753   BASOSABS 0.0 03/17/2021 0753   CMP Latest Ref Rng & Units 03/17/2021 03/16/2021 03/15/2021  Glucose 70 - 99 mg/dL 184(H) 137(H) 106(H)  BUN 8 - 23 mg/dL 89(H)  83(H) 77(H)  Creatinine 0.44 - 1.00 mg/dL 6.84(H) 6.34(H) 5.90(H)  Sodium 135 - 145 mmol/L 137 140 140  Potassium 3.5 - 5.1 mmol/L 4.5 4.6 4.0  Chloride 98 - 111 mmol/L 104 108 107  CO2 22 - 32 mmol/L 19(L) 21(L) 23  Calcium 8.9 - 10.3 mg/dL 8.7(L) 7.8(L) 7.9(L)  Total Protein 6.5 - 8.1 g/dL - - -  Total Bilirubin 0.3 - 1.2 mg/dL - - -  Alkaline Phos 38 - 126 U/L - - -  AST 15 - 41 U/L - - -  ALT 0 - 44 U/L - - -     Radiology Studies: No results found.   Scheduled Meds: . aspirin  81 mg Oral Daily  . calcitRIOL  0.25 mcg Oral Once per day on Mon Wed Fri  . carvedilol  3.125 mg Oral BID WC  . fluticasone  2 spray Each Nare Daily  . heparin  5,000 Units Subcutaneous Q8H  . hydrALAZINE  25 mg Oral Q8H  . insulin aspart  0-5 Units Subcutaneous QHS  . insulin aspart  0-9 Units Subcutaneous TID WC  . insulin detemir  8 Units Subcutaneous BID  . iron polysaccharides  150 mg Oral Daily  . mometasone-formoterol  2 puff Inhalation BID  . rosuvastatin  10 mg Oral Daily  . sevelamer carbonate  800 mg Oral TID WC   Continuous Infusions: . sodium chloride       LOS: 8 days   Time spent: Alexander, MD [facility] To contact the attending provider between 7A-7P or the covering provider during after hours 7P-7A, please log into the web site [email or web address] and access using universal [place] password for that web site. If you do not have the password, please call the hospital operator.  03/17/2021, 12:24 PM

## 2021-03-17 NOTE — Hospital Course (Signed)
5 community dwelling white female CKD 4 follows Dr. Cherlyn Cushing baseline creatinine 4.09--previously has refused dialysis-referred to [facility] for kidney transplant eval IDDM IDA HTN Asthma CAD-cardiac cath 612 1830% LAD stenosis  Developed SOB 03/09/2021-CXR pleural effusions-Rx Lasix 60 twice daily Became bradycardic hypotensive on attempt at right basilic vein exposure Cardiology reconsulted

## 2021-03-17 NOTE — Progress Notes (Addendum)
[facility] Progress Note    Assessment/ Plan:   1. AKI on CKD (Stage IV-V):  Minimal AKI component at this time and mostly just progressive CKD.    - Discussed with the patient and her husband that creatinine/BUN do continue to rise; no emergent need for HD.  Discussed need for continued close inpatient monitoring for the possible need for dialysis.  - I have recommended starting HD tomorrow after tunneled catheter placement with IR.  She would like to speak with her husband and we were not able to reach him by phone but he is coming here.  I will check back with her later today  - Discontinue lasix 80 mg PO daily - Lasix 80 mg IV once now - s/p attempted AVF 5/24 which was aborted after acute onset of bradycardia and hypotension with EKG changes in the OR.  Per vascular could consider a first stage right basilic vein transposition under local anesthesia if cleared for same   2. HFpEF exacerbation:  volume status improved.  Continue lasix as above  3. Bradycardia - cardiology consulted.  May have been artifact per their note. No clear pauses or brady seen per cardiology attending and per her note there is a brief episode of SVT  4. CAD - per cardiology.  Note acute bradycardia with anesthesia  5. HTN: - improved control to 140's on current regimen; note hx of bradycardia as above/ cardiology consulted   6. IDDM: - Continue management per primary team  6. Anemia CKD and iron deficiency  - has been receiving IV iron -  paused per patient request as had nausea and headache with the second dose  Disposition - continue inpatient monitoring as Cr continues to rise.  I have recommended starting dialysis.     Subjective:    s/p attempted AVF 5/24 which was aborted after acute onset of bradycardia and hypotension with EKG changes in the OR. No urine output charted for 5/24.  We discussed risks/benefits/indications for dialysis and I recommended starting dialysis tomorrow  after tunneled catheter.  She does think this seems reasonable but would like to talk with her husband first and then speak again with me  Review of systems:    denies n/v  Denies shortness of breath or chest pain    Objective:   BP (!) 147/67 (BP Location: Left Arm)   Pulse 74   Temp (!) 97.4 F (36.3 C) (Oral)   Resp 18   Ht 5\' 3"  (1.6 m)   Wt 97.1 kg   SpO2 99%   BMI 37.91 kg/m   Intake/Output Summary (Last 24 hours) at 03/17/2021 8841 Last data filed at 03/17/2021 6606 Gross per 24 hour  Intake 1270 ml  Output 1 ml  Net 1269 ml   Weight change: -0.044 kg  Physical Exam:   General adult female in bed in no acute distress HEENT normocephalic atraumatic extraocular movements intact sclera anicteric Neck supple trachea midline Lungs clear anteriorly; normal work of breathing at rest; on 2 liters. Some increased work of breathing with speech or without the oxygen Heart S1S2 no rub Abdomen soft nontender nondistended Extremities 1+ edema bilateral lower extremities Psych normal mood and affect Neuro - alert and oriented x 3 provides hx and follows commands Access - none - incision right arm approximated  Imaging: No results found.  Labs: BMET Recent Labs  Lab 03/11/21 0309 03/11/21 1410 03/12/21 3016 03/13/21 0245 03/14/21 0159 03/15/21 0729 03/16/21 0332  NA 139  --  139 140 138 140 140  K 4.4  --  3.8 4.1 4.2 4.0 4.6  CL 110  --  108 108 105 107 108  CO2 20*  --  23 21* 21* 23 21*  GLUCOSE 80  --  129* 95 143* 106* 137*  BUN 65*  --  69* 72* 75* 77* 83*  CREATININE 5.95*  --  5.67* 5.53* 5.63* 5.90* 6.34*  CALCIUM 8.3*  --  8.1* 8.1* 7.8* 7.9* 7.8*  PHOS  --  5.7*  --   --   --   --  7.0*   CBC Recent Labs  Lab 03/11/21 1410 03/14/21 0159 03/16/21 1443 03/17/21 0753  WBC 8.1 9.7 15.7* 15.1*  NEUTROABS  --  5.1 13.7* 12.3*  HGB 7.5* 8.2* 7.5* 8.2*  HCT 22.8* 25.1* 22.8* 24.7*  MCV 86.4 86.6 86.7 86.1  PLT 421* 440* 407* 440*    Medications:     . aspirin  81 mg Oral Daily  . calcitRIOL  0.25 mcg Oral Once per day on Mon Wed Fri  . carvedilol  12.5 mg Oral BID WC  . fluticasone  2 spray Each Nare Daily  . furosemide  80 mg Oral Daily  . heparin  5,000 Units Subcutaneous Q8H  . hydrALAZINE  25 mg Oral Q8H  . insulin aspart  0-5 Units Subcutaneous QHS  . insulin aspart  0-9 Units Subcutaneous TID WC  . insulin detemir  15 Units Subcutaneous BID  . mometasone-formoterol  2 puff Inhalation BID  . rosuvastatin  10 mg Oral Daily    Claudia Desanctis, MD  03/17/2021  9:48 AM   Spoke with the patient and her husband at bedside and her son joined via phone.  We discussed risks/benefits/indications for starting dialysis.  I have updated her primary team as well   Start HD tomorrow after tunneled catheter placement.  Consult IR.  NPO after midnight tonight for procedure tomorrow. HD on 5/26 and anticipate on 5/27 as well.   Cardiology has come to see her.  They are in agreement with starting HD tomorrow with catheter.   Claudia Desanctis, MD 11:32 AM 03/17/2021

## 2021-03-17 NOTE — Progress Notes (Signed)
VASCULAR SURGERY:  Her procedure was aborted yesterday she developed the acute onset of bradycardia, hypotension, with EKG changes.  Cardiology is following.  We could reconsider a first stage right basilic vein transposition under local anesthesia if the patient is agreeable once okay with primary service and cardiology.  Currently the patient is reluctant to return to the operating room anytime soon.  7:06 AM  Deitra Mayo, MD

## 2021-03-18 ENCOUNTER — Inpatient Hospital Stay (HOSPITAL_COMMUNITY): Payer: Medicare Other

## 2021-03-18 ENCOUNTER — Inpatient Hospital Stay (HOSPITAL_COMMUNITY): Admission: RE | Admit: 2021-03-18 | Payer: Medicare Other | Source: Ambulatory Visit

## 2021-03-18 ENCOUNTER — Encounter (HOSPITAL_COMMUNITY): Payer: Medicare Other

## 2021-03-18 DIAGNOSIS — N185 Chronic kidney disease, stage 5: Secondary | ICD-10-CM | POA: Diagnosis not present

## 2021-03-18 DIAGNOSIS — I2510 Atherosclerotic heart disease of native coronary artery without angina pectoris: Secondary | ICD-10-CM | POA: Diagnosis not present

## 2021-03-18 DIAGNOSIS — R001 Bradycardia, unspecified: Secondary | ICD-10-CM | POA: Diagnosis not present

## 2021-03-18 DIAGNOSIS — I5033 Acute on chronic diastolic (congestive) heart failure: Secondary | ICD-10-CM | POA: Diagnosis not present

## 2021-03-18 HISTORY — PX: IR FLUORO GUIDE CV LINE RIGHT: IMG2283

## 2021-03-18 HISTORY — PX: IR US GUIDE VASC ACCESS RIGHT: IMG2390

## 2021-03-18 LAB — RENAL FUNCTION PANEL
Albumin: 2.9 g/dL — ABNORMAL LOW (ref 3.5–5.0)
Anion gap: 12 (ref 5–15)
BUN: 93 mg/dL — ABNORMAL HIGH (ref 8–23)
CO2: 21 mmol/L — ABNORMAL LOW (ref 22–32)
Calcium: 8.5 mg/dL — ABNORMAL LOW (ref 8.9–10.3)
Chloride: 105 mmol/L (ref 98–111)
Creatinine, Ser: 6.83 mg/dL — ABNORMAL HIGH (ref 0.44–1.00)
GFR, Estimated: 6 mL/min — ABNORMAL LOW (ref >60)
Glucose, Bld: 147 mg/dL — ABNORMAL HIGH (ref 70–99)
Phosphorus: 7.5 mg/dL — ABNORMAL HIGH (ref 2.5–4.6)
Potassium: 4.6 mmol/L (ref 3.5–5.1)
Sodium: 138 mmol/L (ref 135–145)

## 2021-03-18 LAB — CBC WITH DIFFERENTIAL/PLATELET
Abs Immature Granulocytes: 0.05 10*3/uL (ref 0.00–0.07)
Basophils Absolute: 0 10*3/uL (ref 0.0–0.1)
Basophils Relative: 0 %
Eosinophils Absolute: 0.2 10*3/uL (ref 0.0–0.5)
Eosinophils Relative: 2 %
HCT: 22.9 % — ABNORMAL LOW (ref 36.0–46.0)
Hemoglobin: 7.6 g/dL — ABNORMAL LOW (ref 12.0–15.0)
Immature Granulocytes: 1 %
Lymphocytes Relative: 22 %
Lymphs Abs: 2.4 10*3/uL (ref 0.7–4.0)
MCH: 28.4 pg (ref 26.0–34.0)
MCHC: 33.2 g/dL (ref 30.0–36.0)
MCV: 85.4 fL (ref 80.0–100.0)
Monocytes Absolute: 1.1 10*3/uL — ABNORMAL HIGH (ref 0.1–1.0)
Monocytes Relative: 10 %
Neutro Abs: 7 10*3/uL (ref 1.7–7.7)
Neutrophils Relative %: 65 %
Platelets: 396 10*3/uL (ref 150–400)
RBC: 2.68 MIL/uL — ABNORMAL LOW (ref 3.87–5.11)
RDW: 14.8 % (ref 11.5–15.5)
WBC: 10.8 10*3/uL — ABNORMAL HIGH (ref 4.0–10.5)
nRBC: 0 % (ref 0.0–0.2)

## 2021-03-18 LAB — GLUCOSE, CAPILLARY
Glucose-Capillary: 128 mg/dL — ABNORMAL HIGH (ref 70–99)
Glucose-Capillary: 100 mg/dL — ABNORMAL HIGH (ref 70–99)
Glucose-Capillary: 100 mg/dL — ABNORMAL HIGH (ref 70–99)
Glucose-Capillary: 125 mg/dL — ABNORMAL HIGH (ref 70–99)

## 2021-03-18 LAB — HEPATITIS B SURFACE ANTIBODY,QUALITATIVE: Hep B S Ab: NONREACTIVE

## 2021-03-18 LAB — HEPATITIS B CORE ANTIBODY, IGM: Hep B C IgM: NONREACTIVE

## 2021-03-18 LAB — HEPATITIS B SURFACE ANTIGEN: Hepatitis B Surface Ag: NONREACTIVE

## 2021-03-18 IMAGING — XA IR FLUORO GUIDE CV LINE*R*
3 series · 6 of 6 positions shown · non-contrast
Comparison: none

INDICATION: Renal failure

[Series 1: ir fluoro guide cv line*right* · 2 of 2 slices shown (1 of 2)]
[im 1/2]
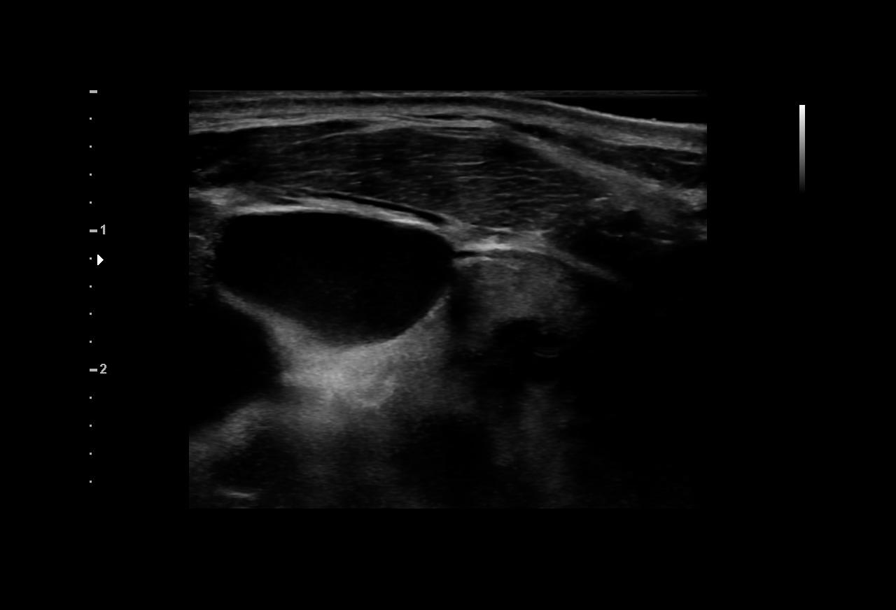
[im 2/2]
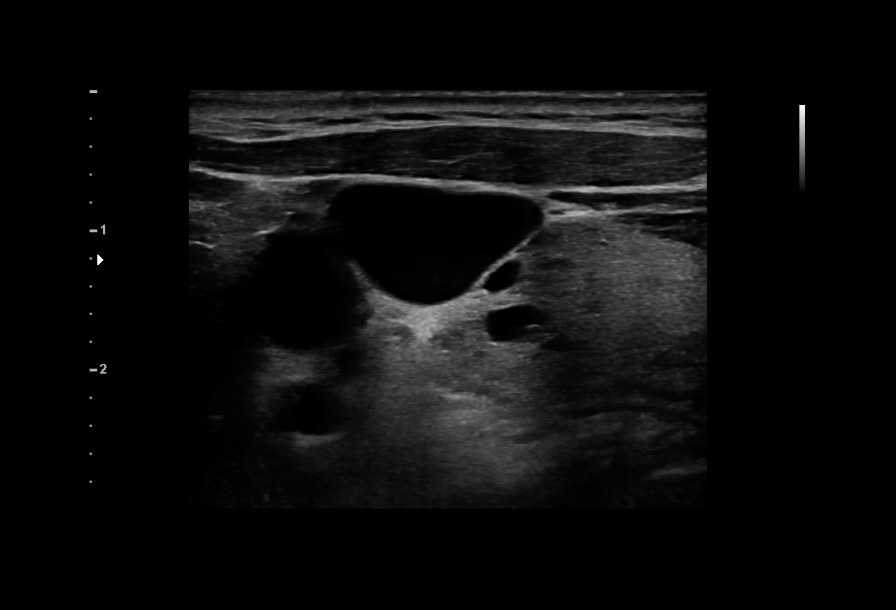

[Series 1: fl (-) angio · 3 of 3 slices shown]
[im 1/3]
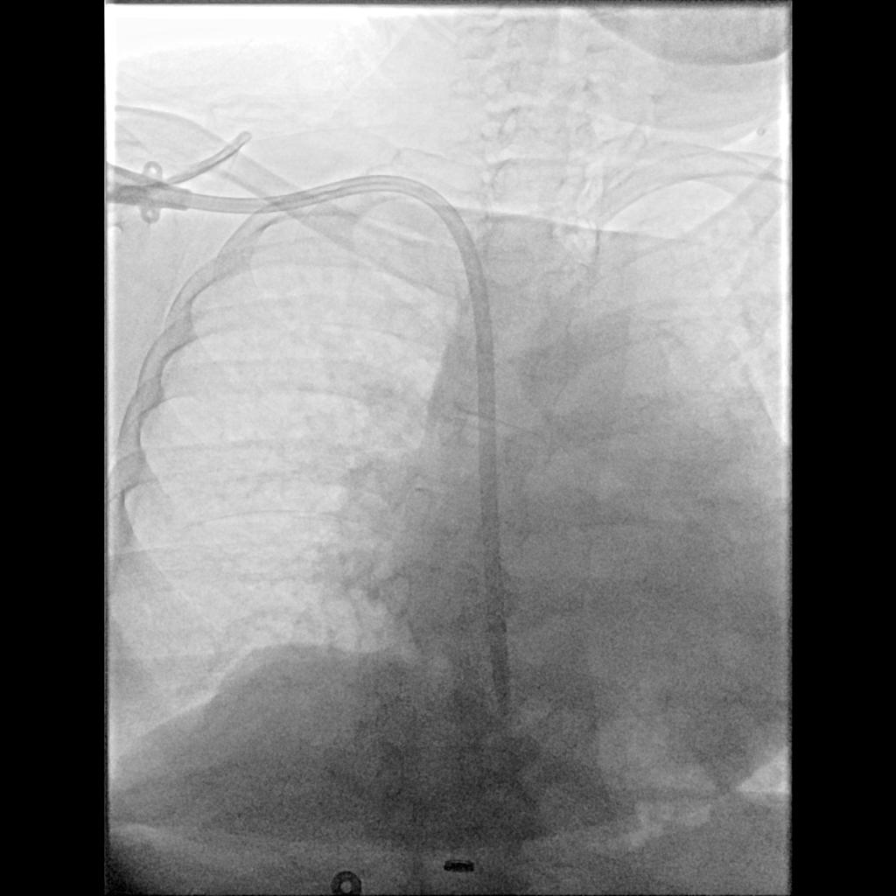
[im 2/3]
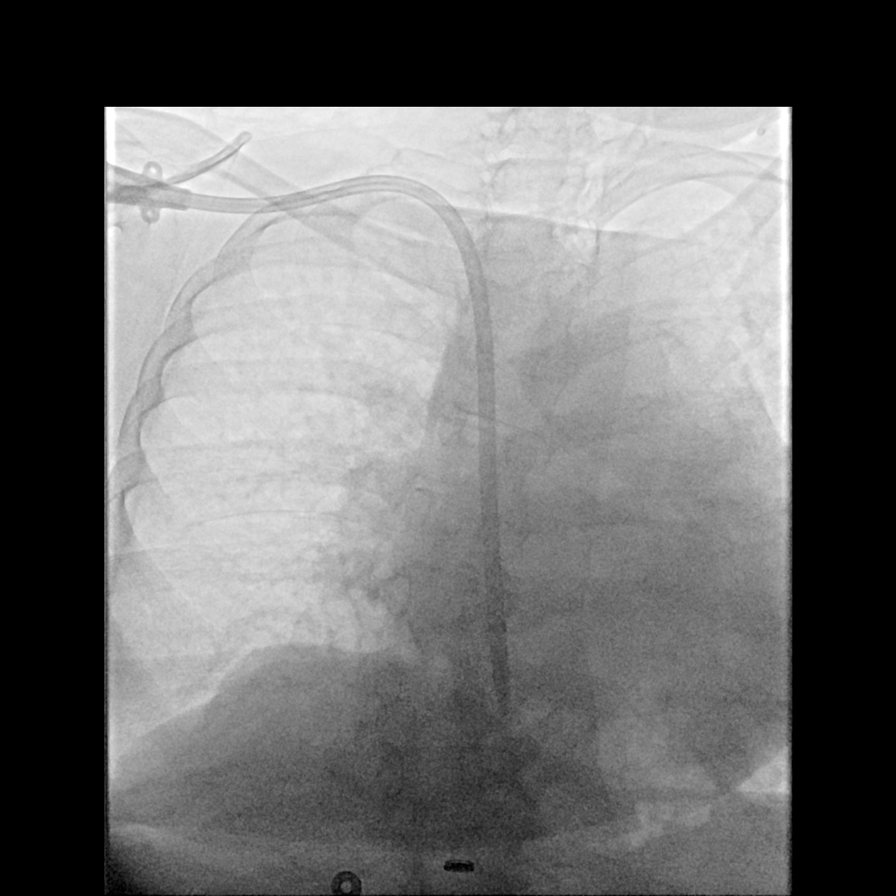
[im 3/3]
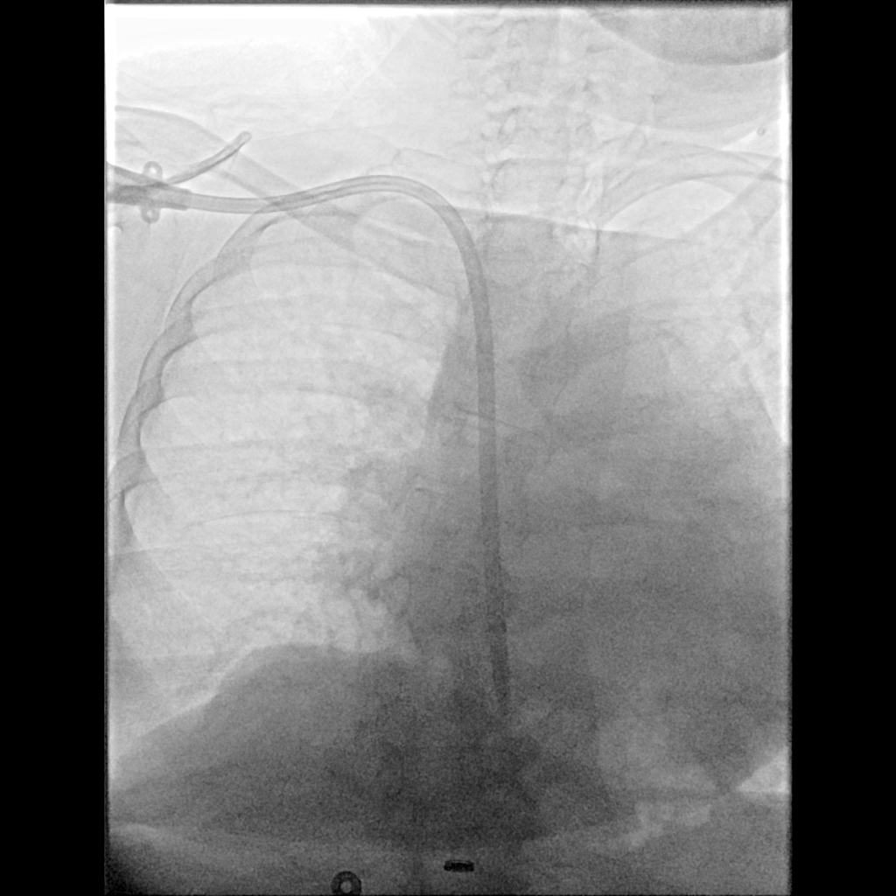

[Series 1: ir fluoro guide cv line*right* · 1 of 1 slices shown (2 of 2)]
[im 1/1]
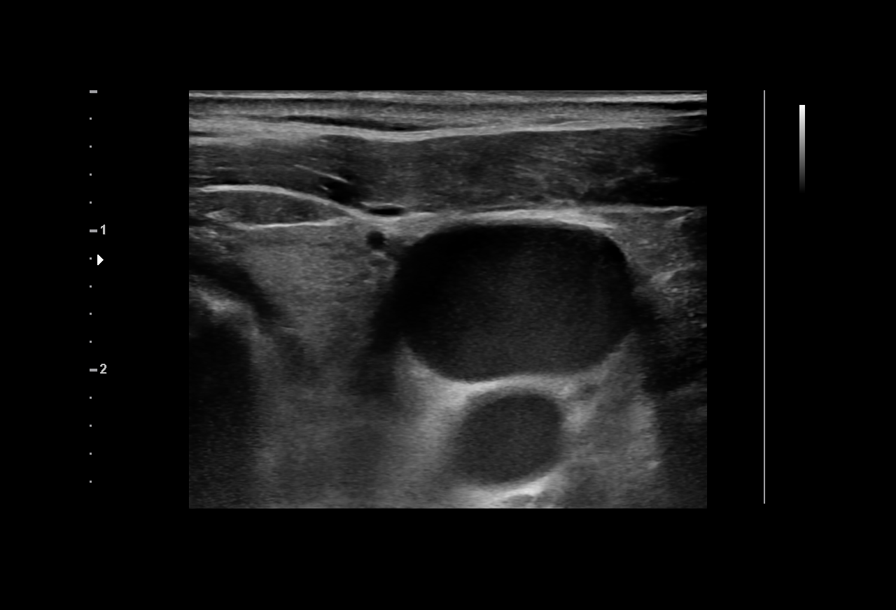

[6 of 6 positions shown; findings below may reference images not displayed]

EXAM:
Tunneled right IJ hemodialysis catheter placement

MEDICATIONS:
2 g Ancef IV; The antibiotic was administered within an appropriate
time interval prior to skin puncture.

ANESTHESIA/SEDATION:
Moderate (conscious) sedation was employed during this procedure. A
total of Versed 1 mg and Fentanyl 25 mcg was administered
intravenously.

Moderate Sedation Time: 30 minutes. The patient's level of
consciousness and vital signs were monitored continuously by
radiology nursing throughout the procedure under my direct
supervision.

FLUOROSCOPY TIME:  Fluoroscopy Time: 0 minutes 54 seconds (6 mGy).

COMPLICATIONS:
None immediate.

PROCEDURE:
Informed written consent was obtained from the patient after a
thorough discussion of the procedural risks, benefits and
alternatives. All questions were addressed. Maximal Sterile Barrier
Technique was utilized including caps, mask, sterile gowns, sterile
gloves, sterile drape, hand hygiene and skin antiseptic. A timeout
was performed prior to the initiation of the procedure.

The right internal jugular vein was evaluated with ultrasound and
shown to be patent. A permanent ultrasound image was obtained and
placed in the patient's medical record. Using sterile gel and a
sterile probe cover, the right internal jugular vein was entered
with a 21 ga needle during real time ultrasound guidance.

0.018 inch guidewire placed and 21 ga needle exchanged for
transitional dilator set. Utilizing fluoroscopy, 0.035 inch
guidewire advanced through the needle without difficulty.

Seriel dilation was performed and peel-away sheath was placed.
Attention then turned to the right anterior upper chest. Following
local lidocaine administration, the hemodialysis catheter was
tunneled from the chest wall to the venotomy site. The catheter was
inserted through the peel-away sheath. The tip of the catheter was
positioned within the right atrium using fluoroscopic guidance.

All lumens of the catheter aspirated and flushed well. The dialysis
lumens were locked with Heparin.

The catheter was secured to the skin with suture. The insertion site
was covered with a Biopatch and sterile dressing.
IMPRESSION: 19 cm right IJ tunneled hemodialysis catheter is ready for use.

## 2021-03-18 MED ORDER — MIDAZOLAM HCL 2 MG/2ML IJ SOLN
INTRAMUSCULAR | Status: AC | PRN
  Administered 2021-03-18: 1 mg via INTRAVENOUS

## 2021-03-18 MED ORDER — HEPARIN SODIUM (PORCINE) 1000 UNIT/ML IJ SOLN
1000.0000 [IU] | INTRAMUSCULAR | Status: DC | PRN
  Filled 2021-03-18 (×2): qty 1

## 2021-03-18 MED ORDER — LIDOCAINE HCL 1 % IJ SOLN
INTRAMUSCULAR | Status: AC
  Filled 2021-03-18: qty 20

## 2021-03-18 MED ORDER — MIDAZOLAM HCL 2 MG/2ML IJ SOLN
INTRAMUSCULAR | Status: AC
  Filled 2021-03-18: qty 2

## 2021-03-18 MED ORDER — CEFAZOLIN SODIUM-DEXTROSE 2-4 GM/100ML-% IV SOLN
INTRAVENOUS | Status: AC
  Filled 2021-03-18: qty 100

## 2021-03-18 MED ORDER — HEPARIN SODIUM (PORCINE) 1000 UNIT/ML IJ SOLN
INTRAMUSCULAR | Status: AC
  Administered 2021-03-18: 3.2 [IU] via INTRAVENOUS_CENTRAL
  Filled 2021-03-18: qty 1

## 2021-03-18 MED ORDER — ACETAMINOPHEN 325 MG PO TABS
ORAL_TABLET | ORAL | Status: AC
  Filled 2021-03-18: qty 2

## 2021-03-18 MED ORDER — LIDOCAINE-EPINEPHRINE 1 %-1:100000 IJ SOLN
INTRAMUSCULAR | Status: AC
  Administered 2021-03-18: 6 mL via SUBCUTANEOUS
  Filled 2021-03-18: qty 1

## 2021-03-18 MED ORDER — CHLORHEXIDINE GLUCONATE CLOTH 2 % EX PADS: 6.0000 | MEDICATED_PAD | Freq: Every day | CUTANEOUS | Status: DC

## 2021-03-18 MED ORDER — CEFAZOLIN SODIUM-DEXTROSE 2-4 GM/100ML-% IV SOLN
2.0000 g | INTRAVENOUS | Status: AC
  Administered 2021-03-18: 2 g via INTRAVENOUS
  Filled 2021-03-18: qty 100

## 2021-03-18 MED ORDER — FENTANYL CITRATE (PF) 100 MCG/2ML IJ SOLN
INTRAMUSCULAR | Status: AC | PRN
  Administered 2021-03-18: 25 ug via INTRAVENOUS

## 2021-03-18 MED ORDER — TRAMADOL HCL 50 MG PO TABS
50.0000 mg | ORAL_TABLET | Freq: Two times a day (BID) | ORAL | Status: DC | PRN
  Administered 2021-03-18 – 2021-03-22 (×4): 50 mg via ORAL
  Filled 2021-03-18 (×4): qty 1

## 2021-03-18 MED ORDER — HEPARIN SODIUM (PORCINE) 1000 UNIT/ML IJ SOLN
INTRAMUSCULAR | Status: AC
  Administered 2021-03-18: 3200 [IU] via INTRAVENOUS
  Filled 2021-03-18: qty 4

## 2021-03-18 MED ORDER — FENTANYL CITRATE (PF) 100 MCG/2ML IJ SOLN
INTRAMUSCULAR | Status: AC
  Filled 2021-03-18: qty 2

## 2021-03-18 NOTE — Progress Notes (Signed)
Per Verlon Au, MD, discontinue NPO order and resume renal/carb modified diet. RN carried out orders.

## 2021-03-18 NOTE — Progress Notes (Signed)
Progress Note  Patient Name: Carolyn Russo Date of Encounter: 03/18/2021  [facility] Cardiologist: Sinclair Grooms, MD   Subjective   Just back from IR  Inpatient Medications    Scheduled Meds: . aspirin  81 mg Oral Daily  . calcitRIOL  0.25 mcg Oral Once per day on Mon Wed Fri  . carvedilol  3.125 mg Oral BID WC  . Chlorhexidine Gluconate Cloth  6 each Topical Q0600  . fluticasone  2 spray Each Nare Daily  . heparin  5,000 Units Subcutaneous Q8H  . hydrALAZINE  25 mg Oral Q8H  . insulin aspart  0-5 Units Subcutaneous QHS  . insulin aspart  0-9 Units Subcutaneous TID WC  . insulin detemir  8 Units Subcutaneous BID  . iron polysaccharides  150 mg Oral Daily  . mometasone-formoterol  2 puff Inhalation BID  . rosuvastatin  10 mg Oral Daily  . sevelamer carbonate  800 mg Oral TID WC   Continuous Infusions: . sodium chloride     PRN Meds: sodium chloride, acetaminophen, hydrALAZINE, ipratropium-albuterol, ondansetron (ZOFRAN) IV   Vital Signs    Vitals:   03/18/21 0910 03/18/21 0915 03/18/21 0920 03/18/21 0952  BP: (!) 144/71 (!) 142/74 (!) 132/53 (!) 154/72  Pulse: 88 76 74 76  Resp: 17 20 16 14   Temp:    97.9 F (36.6 C)  TempSrc:    Oral  SpO2: 100% 100% 100% 100%  Weight:      Height:        Intake/Output Summary (Last 24 hours) at 03/18/2021 1000 Last data filed at 03/18/2021 0439 Gross per 24 hour  Intake 240 ml  Output 350 ml  Net -110 ml   Last 3 Weights 03/18/2021 03/17/2021 03/17/2021  Weight (lbs) 214 lb 4.8 oz 214 lb 214 lb  Weight (kg) 97.206 kg 97.07 kg 97.07 kg      Telemetry    Sinus in the 60-70s, bradycardia overnight - Personally Reviewed  ECG    No new tracings - Personally Reviewed  Physical Exam   GEN: No acute distress.   Neck: No JVD Cardiac: RRR, no murmurs, rubs, or gallops.  Respiratory: Clear to auscultation on anterior exam GI: Soft, nontender, non-distended  MS: 2+ B LE edema; No deformity. Neuro:  Nonfocal   Psych: Normal affect   Labs    High Sensitivity Troponin:   Recent Labs  Lab 03/09/21 2110 03/09/21 2256  TROPONINIHS 51* 54*      Chemistry Recent Labs  Lab 03/16/21 0332 03/17/21 0753 03/18/21 0322  NA 140 137 138  K 4.6 4.5 4.6  CL 108 104 105  CO2 21* 19* 21*  GLUCOSE 137* 184* 147*  BUN 83* 89* 93*  CREATININE 6.34* 6.84* 6.83*  CALCIUM 7.8* 8.7* 8.5*  ALBUMIN 2.7* 3.2* 2.9*  GFRNONAA 7* 6* 6*  ANIONGAP 11 14 12      Hematology Recent Labs  Lab 03/16/21 1443 03/17/21 0753 03/18/21 0322  WBC 15.7* 15.1* 10.8*  RBC 2.63* 2.87* 2.68*  HGB 7.5* 8.2* 7.6*  HCT 22.8* 24.7* 22.9*  MCV 86.7 86.1 85.4  MCH 28.5 28.6 28.4  MCHC 32.9 33.2 33.2  RDW 15.0 14.9 14.8  PLT 407* 440* 396    BNPNo results for input(s): BNP, PROBNP in the last 168 hours.   DDimer No results for input(s): DDIMER in the last 168 hours.   Radiology    No results found.  Cardiac Studies   Cath 04/04/2017   Heavily  calcified eccentric 30 to 40% proximal LAD.  Widely patent circumflex, ramus intermedius, and native right coronary.  Normal left ventricular size and function with EF 60%. Elevated left ventricular end-diastolic pressure consistent with diastolic heart failure.  RECOMMENDATIONS:   Aggressive risk factor modification  Blood pressure control  Weight loss  Consider sleep study to rule out sleep apnea (based upon airway obstruction noted during the procedure after receiving low-dose sedation).  Echocardiogram 03/11/21:  1. Left ventricular ejection fraction, by estimation, is 50%. The left  ventricle has low normal function. The left ventricle has no regional wall  motion abnormalities. There is mild left ventricular hypertrophy. Left  ventricular diastolic parameters are  consistent with Grade II diastolic dysfunction (pseudonormalization).  Elevated left ventricular end-diastolic pressure. The E/e' is 76.  2. Right ventricular systolic function is  normal. The right ventricular  size is normal. There is moderately elevated pulmonary artery systolic  pressure. The estimated right ventricular systolic pressure is 72.6 mmHg.  3. Left atrial size was mildly dilated.  4. The mitral valve is grossly normal. Trivial mitral valve  regurgitation. No evidence of mitral stenosis.  5. The aortic valve is grossly normal. There is mild calcification of the  aortic valve. Aortic valve regurgitation is not visualized. Mild aortic  valve sclerosis is present, with no evidence of aortic valve stenosis.  6. The inferior vena cava is dilated in size with >50% respiratory  variability, suggesting right atrial pressure of 8 mmHg.   Patient Profile     70 y.o. female with PMH advanced kidney disease (CKD 5, planning for dialysis access), hypertension, hyperlipidemia, type II diabetes, CAD. We are re-consulted for episode of bradycardia and hypotension in OR 03/16/21. Coreg reduced. IR placed HD catheter this morning.  Assessment & Plan    Bradycardia and hypotension in OR - no pauses on telemetry - BB initially held, now restarted at 3.125 mg coreg BID - HR in the 60-70s   Acute on chronic diastolic heart failure CKD stage V - HD catheter placed today - will defer volume management to HD   CAD - nonobstructive CAD on prior cath - continue ASA and statin, low dose coreg - no chest pain   [facility] will sign off.   Medication Recommendations:  As above, coreg 3.125 mg BID Other recommendations (labs, testing, etc):  None  Follow up as an outpatient:  Will arrange    For questions or updates, please contact [place] Please consult [email or web address] for contact info under        Signed, Ledora Bottcher, PA  03/18/2021, 10:00 AM

## 2021-03-18 NOTE — Progress Notes (Signed)
[facility] Progress Note    Assessment/ Plan:   1. CKD with progression to ESRD:  s/p attempted AVF 5/24 which was aborted after acute onset of bradycardia and hypotension with EKG changes in the OR.  Per vascular could consider a first stage right basilic vein transposition under local anesthesia if cleared for same  - Starting HD today after tunneled catheter placement.  - Plan for HD on 5/27 as well - Spoke with HD SW to start CLIP process on 5/25  2. HFpEF exacerbation:  volume status improved.  Continue lasix intermittently and now starting HD  3. Bradycardia - cardiology consulted and have lowered her beta blocker.  No tele strips available from OR  4. CAD - per cardiology.  Note acute bradycardia with anesthesia  5. HTN: - continue current regimen - optimize volume with HD   6. IDDM: - Continue management per primary team  7. Anemia CKD and iron deficiency  - s/p iv iron -  paused per patient request as had nausea and headache with the second dose  8. Metabolic bone disease - continue renvela. Starting HD. Continue calcitriol   Disposition - continue inpatient monitoring.  Awaiting tunneled catheter and outpatient HD unit    Subjective:    had 350 ml UOP and one unmeasured void for 5/25.  Spoke with her husband.  Her husband got to bedside shortly before she is to be taken down to IR.   Review of systems:   Had nausea last night with emesis  Denies shortness of breath or chest pain    Objective:   BP (!) 163/81 (BP Location: Left Arm)   Pulse 65   Temp 97.8 F (36.6 C) (Oral)   Resp 20   Ht 5\' 3"  (1.6 m)   Wt 97.2 kg   SpO2 99%   BMI 37.96 kg/m   Intake/Output Summary (Last 24 hours) at 03/18/2021 7673 Last data filed at 03/18/2021 0439 Gross per 24 hour  Intake 540 ml  Output 350 ml  Net 190 ml   Weight change: 0.906 kg  Physical Exam:  General adult female in bed in no acute distress HEENT normocephalic atraumatic extraocular  movements intact sclera anicteric Neck supple trachea midline Lungs clear anteriorly; normal work of breathing at rest; on 2 liters - adjusted for her as this was blowing into her ear. Heart S1S2 no rub Abdomen soft nontender nondistended Extremities 1+ edema bilateral lower extremities Psych normal mood and affect Neuro - alert and oriented x 3 provides hx and follows commands Access - none - incision right arm approximated  Imaging: No results found.  Labs: BMET Recent Labs  Lab 03/11/21 1410 03/12/21 4193 03/13/21 0245 03/14/21 0159 03/15/21 0729 03/16/21 0332 03/17/21 0753 03/18/21 0322  NA  --  139 140 138 140 140 137 138  K  --  3.8 4.1 4.2 4.0 4.6 4.5 4.6  CL  --  108 108 105 107 108 104 105  CO2  --  23 21* 21* 23 21* 19* 21*  GLUCOSE  --  129* 95 143* 106* 137* 184* 147*  BUN  --  69* 72* 75* 77* 83* 89* 93*  CREATININE  --  5.67* 5.53* 5.63* 5.90* 6.34* 6.84* 6.83*  CALCIUM  --  8.1* 8.1* 7.8* 7.9* 7.8* 8.7* 8.5*  PHOS 5.7*  --   --   --   --  7.0* 7.1* 7.5*   CBC Recent Labs  Lab 03/14/21 0159 03/16/21 1443 03/17/21 0753  03/18/21 0322  WBC 9.7 15.7* 15.1* 10.8*  NEUTROABS 5.1 13.7* 12.3* 7.0  HGB 8.2* 7.5* 8.2* 7.6*  HCT 25.1* 22.8* 24.7* 22.9*  MCV 86.6 86.7 86.1 85.4  PLT 440* 407* 440* 396    Medications:    . aspirin  81 mg Oral Daily  . calcitRIOL  0.25 mcg Oral Once per day on Mon Wed Fri  . carvedilol  3.125 mg Oral BID WC  . Chlorhexidine Gluconate Cloth  6 each Topical Q0600  . fluticasone  2 spray Each Nare Daily  . heparin  5,000 Units Subcutaneous Q8H  . hydrALAZINE  25 mg Oral Q8H  . insulin aspart  0-5 Units Subcutaneous QHS  . insulin aspart  0-9 Units Subcutaneous TID WC  . insulin detemir  8 Units Subcutaneous BID  . iron polysaccharides  150 mg Oral Daily  . mometasone-formoterol  2 puff Inhalation BID  . rosuvastatin  10 mg Oral Daily  . sevelamer carbonate  800 mg Oral TID WC    Claudia Desanctis, MD  03/18/2021  8:06  AM

## 2021-03-18 NOTE — Progress Notes (Signed)
Pt returned from dialysis and c/o 8/10 pain in rt shoulder. Pt also c/o nausea administered prn zofran. Contacted MD for new order for pain medication. See new order for pain medication.

## 2021-03-18 NOTE — Progress Notes (Signed)
HepB surface antigen lab result faxed to [facility] to complete outpatient HD referral.  Alphonzo Cruise, [place] [facility] [phone]

## 2021-03-18 NOTE — Progress Notes (Signed)
MEDICATION-RELATED CONSULT NOTE   IR Procedure Consult - Anticoagulant/Antiplatelet PTA/Inpatient Med List Review by Pharmacist    Procedure: tunneled HD catheter    Completed: ~9:30am  Post-Procedural bleeding risk per IR MD assessment:  low  Antithrombotic medications on inpatient or PTA profile prior to procedure:   Heparin 5000 units SQ q8hrs.  Last dose 6:25am.    Recommended restart time per IR Post-Procedure Guidelines:    Next SQ heparin dose as least 4 hrs post-procedure.     Plan:      Continue Heparin 5000 units SQ q8hrs.   Next scheduled dose is >4 hrs post-procedure.   Arty Baumgartner, [place] 03/18/2021 10:35 AM

## 2021-03-18 NOTE — Progress Notes (Signed)
Patient has returned to unit from IR.

## 2021-03-18 NOTE — Procedures (Signed)
Interventional Radiology Procedure Note  Procedure: Tunneled HD catheter  Indication: Renal failure  Findings:  Catheter tip in right atrium. Catheter is ready for use. Please refer to procedural dictation for full description.  Complications: None  EBL: < 10 mL  Miachel Roux, MD [phone]

## 2021-03-18 NOTE — H&P (Signed)
Patient Status: [facility] - In-pt  Assessment and Plan:  70 y/o F with CKD that has progressed to ESRD s/p attempted AVF placement on 5/24 which was aborted due to acute bradycardia and hypotension seen today for tunneled HD catheter placement to begin HD.  Risks and benefits discussed with the patient including, but not limited to bleeding, infection, vascular injury, pneumothorax which may require chest tube placement, air embolism or even death.  All of the patient's questions were answered, patient is agreeable to proceed.  Consent signed and in chart.  ______________________________________________________________________   History of Present Illness: Kelley F Broy is a 70 y.o. female with past medical history significant for DM, HTN, CAD, CHF, DM and CKD now ESRD who presents today for a tunneled HD catheter placement. Ms. Bolte went to the OR with vascular surgery on 5/24 for right upper extremity AV fistula, however during the procedure she became bradycardic with rates in the 30s and hypotensive with SBP in the 60s and EKG changes so the procedure was aborted. She has been referred to IR for tunneled HD catheter placement using moderate sedation in order to begin hemodialysis.  Allergies and medications reviewed.   Review of Systems: A 12 point ROS discussed and pertinent positives are indicated in the HPI above.  All other systems are negative.  Review of Systems  Constitutional: Negative for chills and fever.  Respiratory: Positive for shortness of breath. Negative for cough.   Cardiovascular: Negative for chest pain.  Gastrointestinal: Negative for abdominal pain, nausea and vomiting.  Musculoskeletal: Negative for back pain.  Neurological: Negative for dizziness and headaches.    Vital Signs: BP (!) 163/81 (BP Location: Left Arm)   Pulse 65   Temp 97.8 F (36.6 C) (Oral)   Resp 20   Ht 5\' 3"  (1.6 m)   Wt 214 lb 4.8 oz (97.2 kg)   SpO2 99%   BMI 37.96 kg/m    Physical Exam Vitals and nursing note reviewed.  Constitutional:      General: She is not in acute distress. HENT:     Head: Normocephalic.     Mouth/Throat:     Mouth: Mucous membranes are moist.     Pharynx: Oropharynx is clear. No oropharyngeal exudate or posterior oropharyngeal erythema.  Cardiovascular:     Rate and Rhythm: Normal rate and regular rhythm.  Pulmonary:     Effort: Pulmonary effort is normal.     Breath sounds: Normal breath sounds.     Comments: 2L supplemental O2 Abdominal:     General: There is no distension.     Palpations: Abdomen is soft.     Tenderness: There is no abdominal tenderness.  Skin:    General: Skin is warm and dry.  Neurological:     Mental Status: She is alert and oriented to person, place, and time.  Psychiatric:        Mood and Affect: Mood normal.        Behavior: Behavior normal.        Thought Content: Thought content normal.        Judgment: Judgment normal.      Imaging reviewed.   Labs:  COAGS: No results for input(s): INR, APTT in the last 8760 hours.  BMP: Recent Labs    04/08/20 1605 05/19/20 1209 06/19/20 0000 02/17/21 1146 03/15/21 0729 03/16/21 0332 03/17/21 0753 03/18/21 0322  NA 138 143 137   < > 140 140 137 138  K 4.2 4.1 4.3   < >  4.0 4.6 4.5 4.6  CL 105 110* 103   < > 107 108 104 105  CO2 19* 21 26*   < > 23 21* 19* 21*  GLUCOSE 443* 92  --    < > 106* 137* 184* 147*  BUN 35* 46* 38*   < > 77* 83* 89* 93*  CALCIUM 8.1* 8.9 8.9   < > 7.9* 7.8* 8.7* 8.5*  CREATININE 2.78* 3.12* 3.1*   < > 5.90* 6.34* 6.84* 6.83*  GFRNONAA 17* 14* 14   < > 7* 7* 6* 6*  GFRAA 19* 17* 17  --   --   --   --   --    < > = values in this interval not displayed.       Electronically Signed: Joaquim Nam, PA-C 03/18/2021, 8:38 AM   I spent a total of 15 minutes in face to face in clinical consultation, greater than 50% of which was counseling/coordinating care for venous access.

## 2021-03-18 NOTE — Progress Notes (Signed)
PROGRESS NOTE   Carolyn Russo  QAS:[id number] DOB: Jul 20, 1951 DOA: 03/09/2021 PCP: [name], MD  Brief Narrative:  29 community dwelling white female CKD 4 follows Dr. Cherlyn Cushing baseline creatinine 4.09--previously has refused dialysis-referred to [facility] for kidney transplant eval IDDM IDA HTN Asthma CAD-cardiac cath 61218---30% LAD stenosis  Developed SOB 03/09/2021-CXR pleural effusions-Rx Lasix 60 twice daily Became bradycardic hypotensive on attempt at right basilic vein exposure Cardiology reconsulted  Hospital-Problem based course  Perioperative bradycardia hypotension 5/24 Received calcium adrenaline atropine No further issues on monitors Cardiology S/O5/26 appreciate input Continue Coreg 3.125 Declared end-stage renal disease Diuresis limited-TDC right chest placed 5/26 IHD initiated 5/26-rest as per nephrology Start Renvela 800 3 times daily Iron 43 sat ratios 14-received IV iron but stopped 2/2 nausea? Started Lorin Picket 5/25 Leukocytosis White count down to 10 on its own No fever-monitor and repeat labs a.m. Monitor for fever HFpEF Cardiac cath 6/18--40% LAD stenosis no intervention at that time See above discussion Hydralazine 25 every 8, resumed Coreg 3.125on 5/25 As needed hydralazine IV if above 180 Holding Hyzaar Insulin-dependent DM TY 2 A1c 6.2 CBG 100-->147--eating 100% of meals Continue Levemir 8 units given procedure, sliding scale 70/30 insulin on hold =   DVT prophylaxis: Heparin Code Status: Full Family Communication: Dr. [name] patient's husband not available today at bedside  Disposition:  Status is: Inpatient  Remains inpatient appropriate because:Hemodynamically unstable, Persistent severe electrolyte disturbances and Ongoing diagnostic testing needed not appropriate for outpatient work up   Dispo: The patient is from: Home              Anticipated d/c is to: Home              Patient currently is not medically stable to  d/c.   Difficult to place patient No  Consultants:   Cardiology, nephrology  Procedures: none  Antimicrobials: no    Subjective:  No distress A little sleepy has just back from access placement No complaints  Objective: Vitals:   03/18/21 1351 03/18/21 1400 03/18/21 1430 03/18/21 1500  BP: 137/71 138/75 (!) 152/82 (!) 144/77  Pulse: 68 67 70   Resp: 18 16    Temp:      TempSrc:      SpO2:      Weight:      Height:        Intake/Output Summary (Last 24 hours) at 03/18/2021 1527 Last data filed at 03/18/2021 0439 Gross per 24 hour  Intake 240 ml  Output 350 ml  Net -110 ml   Filed Weights   03/17/21 0452 03/18/21 0438 03/18/21 1348  Weight: 97.1 kg 97.2 kg 99.1 kg    Examination:  Awake coherent Mallampati 4 S1-S2 no murmur Normal sinus rhythm Abdomen obese nontender no rebound no guarding Neurologically intact moving all 4 limbs  Data Reviewed: personally reviewed   CBC    Component Value Date/Time   WBC 10.8 (H) 03/18/2021 0322   RBC 2.68 (L) 03/18/2021 0322   HGB 7.6 (L) 03/18/2021 0322   HGB 8.0 (L) 02/17/2021 1146   HCT 22.9 (L) 03/18/2021 0322   HCT 24.0 (L) 02/17/2021 1146   PLT 396 03/18/2021 0322   PLT 376 02/17/2021 1146   MCV 85.4 03/18/2021 0322   MCV 85 02/17/2021 1146   MCH 28.4 03/18/2021 0322   MCHC 33.2 03/18/2021 0322   RDW 14.8 03/18/2021 0322   RDW 13.4 02/17/2021 1146   LYMPHSABS 2.4 03/18/2021 0322   MONOABS 1.1 (H) 03/18/2021 7989  EOSABS 0.2 03/18/2021 0322   BASOSABS 0.0 03/18/2021 0322   CMP Latest Ref Rng & Units 03/18/2021 03/17/2021 03/16/2021  Glucose 70 - 99 mg/dL 147(H) 184(H) 137(H)  BUN 8 - 23 mg/dL 93(H) 89(H) 83(H)  Creatinine 0.44 - 1.00 mg/dL 6.83(H) 6.84(H) 6.34(H)  Sodium 135 - 145 mmol/L 138 137 140  Potassium 3.5 - 5.1 mmol/L 4.6 4.5 4.6  Chloride 98 - 111 mmol/L 105 104 108  CO2 22 - 32 mmol/L 21(L) 19(L) 21(L)  Calcium 8.9 - 10.3 mg/dL 8.5(L) 8.7(L) 7.8(L)  Total Protein 6.5 - 8.1 g/dL - - -   Total Bilirubin 0.3 - 1.2 mg/dL - - -  Alkaline Phos 38 - 126 U/L - - -  AST 15 - 41 U/L - - -  ALT 0 - 44 U/L - - -     Radiology Studies: IR Fluoro Guide CV Line Right  Result Date: 03/18/2021 INDICATION: Renal failure EXAM: Tunneled right IJ hemodialysis catheter placement MEDICATIONS: 2 g Ancef IV; The antibiotic was administered within an appropriate time interval prior to skin puncture. ANESTHESIA/SEDATION: Moderate (conscious) sedation was employed during this procedure. A total of Versed 1 mg and Fentanyl 25 mcg was administered intravenously. Moderate Sedation Time: 30 minutes. The patient's level of consciousness and vital signs were monitored continuously by radiology nursing throughout the procedure under my direct supervision. FLUOROSCOPY TIME:  Fluoroscopy Time: 0 minutes 54 seconds (6 mGy). COMPLICATIONS: None immediate. PROCEDURE: Informed written consent was obtained from the patient after a thorough discussion of the procedural risks, benefits and alternatives. All questions were addressed. Maximal Sterile Barrier Technique was utilized including caps, mask, sterile gowns, sterile gloves, sterile drape, hand hygiene and skin antiseptic. A timeout was performed prior to the initiation of the procedure. The right internal jugular vein was evaluated with ultrasound and shown to be patent. A permanent ultrasound image was obtained and placed in the patient's medical record. Using sterile gel and a sterile probe cover, the right internal jugular vein was entered with a 21 ga needle during real time ultrasound guidance. 0.018 inch guidewire placed and 21 ga needle exchanged for transitional dilator set. Utilizing fluoroscopy, 0.035 inch guidewire advanced through the needle without difficulty. Seriel dilation was performed and peel-away sheath was placed. Attention then turned to the right anterior upper chest. Following local lidocaine administration, the hemodialysis catheter was tunneled  from the chest wall to the venotomy site. The catheter was inserted through the peel-away sheath. The tip of the catheter was positioned within the right atrium using fluoroscopic guidance. All lumens of the catheter aspirated and flushed well. The dialysis lumens were locked with Heparin. The catheter was secured to the skin with suture. The insertion site was covered with a Biopatch and sterile dressing. IMPRESSION: 19 cm right IJ tunneled hemodialysis catheter is ready for use. Electronically Signed   By: Miachel Roux M.D.   On: 03/18/2021 11:58   IR US Guide Vasc Access Right  Result Date: 03/18/2021 INDICATION: Renal failure EXAM: Tunneled right IJ hemodialysis catheter placement MEDICATIONS: 2 g Ancef IV; The antibiotic was administered within an appropriate time interval prior to skin puncture. ANESTHESIA/SEDATION: Moderate (conscious) sedation was employed during this procedure. A total of Versed 1 mg and Fentanyl 25 mcg was administered intravenously. Moderate Sedation Time: 30 minutes. The patient's level of consciousness and vital signs were monitored continuously by radiology nursing throughout the procedure under my direct supervision. FLUOROSCOPY TIME:  Fluoroscopy Time: 0 minutes 54 seconds (6 mGy). COMPLICATIONS: None  immediate. PROCEDURE: Informed written consent was obtained from the patient after a thorough discussion of the procedural risks, benefits and alternatives. All questions were addressed. Maximal Sterile Barrier Technique was utilized including caps, mask, sterile gowns, sterile gloves, sterile drape, hand hygiene and skin antiseptic. A timeout was performed prior to the initiation of the procedure. The right internal jugular vein was evaluated with ultrasound and shown to be patent. A permanent ultrasound image was obtained and placed in the patient's medical record. Using sterile gel and a sterile probe cover, the right internal jugular vein was entered with a 21 ga needle during  real time ultrasound guidance. 0.018 inch guidewire placed and 21 ga needle exchanged for transitional dilator set. Utilizing fluoroscopy, 0.035 inch guidewire advanced through the needle without difficulty. Seriel dilation was performed and peel-away sheath was placed. Attention then turned to the right anterior upper chest. Following local lidocaine administration, the hemodialysis catheter was tunneled from the chest wall to the venotomy site. The catheter was inserted through the peel-away sheath. The tip of the catheter was positioned within the right atrium using fluoroscopic guidance. All lumens of the catheter aspirated and flushed well. The dialysis lumens were locked with Heparin. The catheter was secured to the skin with suture. The insertion site was covered with a Biopatch and sterile dressing. IMPRESSION: 19 cm right IJ tunneled hemodialysis catheter is ready for use. Electronically Signed   By: Miachel Roux M.D.   On: 03/18/2021 11:58     Scheduled Meds: . aspirin  81 mg Oral Daily  . calcitRIOL  0.25 mcg Oral Once per day on Mon Wed Fri  . carvedilol  3.125 mg Oral BID WC  . [START ON 03/19/2021] Chlorhexidine Gluconate Cloth  6 each Topical Q0600  . fluticasone  2 spray Each Nare Daily  . heparin  5,000 Units Subcutaneous Q8H  . heparin sodium (porcine)      . hydrALAZINE  25 mg Oral Q8H  . insulin aspart  0-5 Units Subcutaneous QHS  . insulin aspart  0-9 Units Subcutaneous TID WC  . insulin detemir  8 Units Subcutaneous BID  . iron polysaccharides  150 mg Oral Daily  . mometasone-formoterol  2 puff Inhalation BID  . rosuvastatin  10 mg Oral Daily  . sevelamer carbonate  800 mg Oral TID WC   Continuous Infusions: . sodium chloride       LOS: 9 days   Time spent: [name], MD [facility] To contact the attending provider between 7A-7P or the covering provider during after hours 7P-7A, please log into the web site [email or web address] and access using  universal Brave password for that web site. If you do not have the password, please call the hospital operator.  03/18/2021, 3:27 PM

## 2021-03-18 NOTE — Progress Notes (Signed)
Renal Navigator attempted to speak with patient regarding referral to outpatient HD for treatment of ESRD, however, she was not available at this time. So not to cause delay, Navigator submitted referral, pending HepB labs with first tx, to [facility] Admissions to request clinic closest to patient's home who can accommodate a new referral. Navigator will follow up with patient at a later time.   Alphonzo Cruise, [place] Renal Navigator [phone]

## 2021-03-18 NOTE — Plan of Care (Signed)
  Problem: Education: Goal: Knowledge of General Education information will improve Description: Including pain rating scale, medication(s)/side effects and non-pharmacologic comfort measures Outcome: Progressing   Problem: Health Behavior/Discharge Planning: Goal: Ability to manage health-related needs will improve Outcome: Progressing   Problem: Clinical Measurements: Goal: Ability to maintain clinical measurements within normal limits will improve Outcome: Progressing Goal: Will remain free from infection Outcome: Progressing Goal: Diagnostic test results will improve Outcome: Progressing Goal: Respiratory complications will improve Outcome: Progressing Goal: Cardiovascular complication will be avoided Outcome: Progressing   Problem: Nutrition: Goal: Adequate nutrition will be maintained Outcome: Progressing   Problem: Activity: Goal: Risk for activity intolerance will decrease Outcome: Progressing   Problem: Pain Managment: Goal: General experience of comfort will improve Outcome: Progressing   Problem: Safety: Goal: Ability to remain free from injury will improve Outcome: Progressing   Problem: Skin Integrity: Goal: Risk for impaired skin integrity will decrease Outcome: Progressing

## 2021-03-19 DIAGNOSIS — I5033 Acute on chronic diastolic (congestive) heart failure: Secondary | ICD-10-CM | POA: Diagnosis not present

## 2021-03-19 LAB — GLUCOSE, CAPILLARY
Glucose-Capillary: 76 mg/dL (ref 70–99)
Glucose-Capillary: 97 mg/dL (ref 70–99)
Glucose-Capillary: 101 mg/dL — ABNORMAL HIGH (ref 70–99)

## 2021-03-19 LAB — CBC
HCT: 22.3 % — ABNORMAL LOW (ref 36.0–46.0)
Hemoglobin: 7.4 g/dL — ABNORMAL LOW (ref 12.0–15.0)
MCH: 28.4 pg (ref 26.0–34.0)
MCHC: 33.2 g/dL (ref 30.0–36.0)
MCV: 85.4 fL (ref 80.0–100.0)
Platelets: 365 10*3/uL (ref 150–400)
RBC: 2.61 MIL/uL — ABNORMAL LOW (ref 3.87–5.11)
RDW: 15 % (ref 11.5–15.5)
WBC: 10.2 10*3/uL (ref 4.0–10.5)
nRBC: 0.2 % (ref 0.0–0.2)

## 2021-03-19 LAB — RENAL FUNCTION PANEL
Albumin: 2.7 g/dL — ABNORMAL LOW (ref 3.5–5.0)
Anion gap: 9 (ref 5–15)
BUN: 62 mg/dL — ABNORMAL HIGH (ref 8–23)
CO2: 24 mmol/L (ref 22–32)
Calcium: 8.1 mg/dL — ABNORMAL LOW (ref 8.9–10.3)
Chloride: 107 mmol/L (ref 98–111)
Creatinine, Ser: 5.47 mg/dL — ABNORMAL HIGH (ref 0.44–1.00)
GFR, Estimated: 8 mL/min — ABNORMAL LOW (ref >60)
Glucose, Bld: 81 mg/dL (ref 70–99)
Phosphorus: 5 mg/dL — ABNORMAL HIGH (ref 2.5–4.6)
Potassium: 4.2 mmol/L (ref 3.5–5.1)
Sodium: 140 mmol/L (ref 135–145)

## 2021-03-19 MED ORDER — CHLORHEXIDINE GLUCONATE CLOTH 2 % EX PADS: 6.0000 | MEDICATED_PAD | Freq: Every day | CUTANEOUS | Status: DC

## 2021-03-19 MED ORDER — PNEUMOCOCCAL VAC POLYVALENT 25 MCG/0.5ML IJ INJ
0.5000 mL | INJECTION | INTRAMUSCULAR | Status: DC
  Filled 2021-03-19: qty 0.5

## 2021-03-19 MED ORDER — FUROSEMIDE 10 MG/ML IJ SOLN
80.0000 mg | Freq: Once | INTRAMUSCULAR | Status: AC
  Administered 2021-03-19: 80 mg via INTRAVENOUS
  Filled 2021-03-19: qty 8

## 2021-03-19 MED ORDER — INSULIN DETEMIR 100 UNIT/ML ~~LOC~~ SOLN
6.0000 [IU] | Freq: Two times a day (BID) | SUBCUTANEOUS | Status: DC
Start: 2021-03-19 — End: 2021-03-25
  Administered 2021-03-19 – 2021-03-24 (×9): 6 [IU] via SUBCUTANEOUS
  Filled 2021-03-19 (×12): qty 0.06

## 2021-03-19 NOTE — Progress Notes (Signed)
Renal Navigator spoke with patient regarding outpatient HD referral. She has been accepted at [facility] on a TTS schedule with a seat time of 12:20pm. She states church on Saturdays is extremely important to her and therefore, she cannot go to HD on Saturdays. Navigator believes there to be an open MWF spot at 6am at [facility], and will see if patient can be switched to this spot to accommodate her religious practices. Navigator mentioned this to patient and she is concerned that she will not have anyone to pick her up at this time because her husband is a doctor and will still be seeing patients at 10:00am, when her treatments are over. Patient drives, but Navigator has recommended that she not drive to her HD appointments until she knows how she feels with his new treatment once out of the hospital. Currently she states she is feeling awful. Navigator asked to call her husband and she agreed that this would be best to talk with him.  Navigator spoke with Dr. [name], who states he will pick patient up from HD on MWF at 10:00am himself, or arrange for his cousin to pick her up. He states this will not be a problem. Navigator also informed Dr. [name] that they can request a later MWF seat time if they decide this would be best in the future, but that MWF seats are often hard to come by. He stated understanding and appreciation. Navigator informed Dr. [name] that this suggested switch will need to be confirmed with Admissions and the clinic and then Navigator will get back to him/patient. If this plan works, patient would be able to start in the outpatient clinic on Wednesday, 6/1 since she would need to sign intake papers the day before and Monday is a holiday. He stated understanding.  Navigator has reached out to Admissions and will follow closely.   Alphonzo Cruise, [place] Renal Navigator [phone]
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
## 2021-03-19 NOTE — Progress Notes (Signed)
PROGRESS NOTE   Carolyn Russo  HWT:[id number] DOB: 1951/09/11 DOA: 03/09/2021 PCP: [name], MD  Brief Narrative:  56 community dwelling white female CKD 4 follows Dr. Cherlyn Cushing baseline creatinine 4.09--previously has refused dialysis-referred to [facility] for kidney transplant eval IDDM IDA HTN Asthma CAD-cardiac cath 61218---30% LAD stenosis  Developed SOB 03/09/2021-CXR pleural effusions-Rx Lasix 60 twice daily Became bradycardic hypotensive on attempt at right basilic vein exposure Cardiology reconsulted  Hospital-Problem based course  Perioperative bradycardia hypotension 5/24 Received calcium adrenaline atropine No further issues on monitors Cardiology S/O---5/26 appreciate input Continue Coreg 3.125 Declared end-stage renal disease Diuresis limited-TDC right chest placed 5/26-has some pain from this tramadol started IHD initiated 5/26-rest as per nephrology Start Renvela 800 3 times daily-May need increasing dose? Challenge EDW, adjust meds as per nephrology-might want to discontinue hydralazine? Anemia of likely renal disease Iron 43 sat ratios 14-IV iron caused nausea Started Auryxia 5/25 continue-recheck iron levels in 3 weeks Transfusion threshold not met Leukocytosis White count down to 10 on its own No fever-monitor and repeat labs a.m. HFpEF Cardiac cath 6/18--40% LAD stenosis no intervention at that time See above discussion Hydralazine 25 every 8, resumed Coreg 3.125 on 5/25 As needed hydralazine IV if above 180 Holding Hyzaar Lasix 80 as per nephrology 5/27 Insulin-dependent DM TY 2 A1c 6.2 CBG 76-->125 s Cut back Levemir to 6 sliding scale 70/30 insulin on hold   DVT prophylaxis: Heparin Code Status: Full Family Communication: Dr. [name] updated at the bedside Disposition:  Status is: Inpatient  Remains inpatient appropriate because:Hemodynamically unstable, Persistent severe electrolyte disturbances and Ongoing diagnostic testing  needed not appropriate for outpatient work up   Dispo: The patient is from: Home              Anticipated d/c is to: Home expect will be here at least through the weekend clip process etc. needs to be performed              Patient currently is not medically stable to d/c.   Difficult to place patient No  Consultants:   Cardiology, nephrology  Procedures: none  Antimicrobials: no    Subjective:   Satting 95% off of oxygen no overt shortness of breath 6/10 pain right shoulder No chest pain otherwise Did not eat well last night per her husband and did not sleep that well Otherwise she is fair and is probably going for dialysis today   Objective: Vitals:   03/18/21 1803 03/18/21 2028 03/19/21 0023 03/19/21 0413  BP: (!) 153/70 (!) 152/74 140/76 (!) 151/70  Pulse: 74 72 70 75  Resp: _0 Temp: 98 F (36.7 C) 98 F (36.7 C) 98.3 F (36.8 C) 98.5 F (36.9 C)  TempSrc: Oral Oral Oral Oral  SpO2: 94% 96% 94% 95%  Weight:    96.7 kg  Height:        Intake/Output Summary (Last 24 hours) at 03/19/2021 9179 Last data filed at 03/19/2021 0050 Gross per 24 hour  Intake 360 ml  Output 500 ml  Net -140 ml   Filed Weights   03/18/21 1348 03/18/21 1551 03/19/21 0413  Weight: 99.1 kg 99 kg 96.7 kg    Examination:  Coherent pleasant no distress EOMI NCAT no focal deficit Right chest access in place does not seem extremely tender Mild crackles posterolaterally Abdomen soft no rebound no guarding no tenderness Trace lower extremity edema  Data Reviewed: personally reviewed   CBC    Component Value Date/Time  WBC 10.2 03/19/2021 0752   RBC 2.61 (L) 03/19/2021 0752   HGB 7.4 (L) 03/19/2021 0752   HGB 8.0 (L) 02/17/2021 1146   HCT 22.3 (L) 03/19/2021 0752   HCT 24.0 (L) 02/17/2021 1146   PLT 365 03/19/2021 0752   PLT 376 02/17/2021 1146   MCV 85.4 03/19/2021 0752   MCV 85 02/17/2021 1146   MCH 28.4 03/19/2021 0752   MCHC 33.2 03/19/2021 0752   RDW 15.0  03/19/2021 0752   RDW 13.4 02/17/2021 1146   LYMPHSABS 2.4 03/18/2021 0322   MONOABS 1.1 (H) 03/18/2021 0322   EOSABS 0.2 03/18/2021 0322   BASOSABS 0.0 03/18/2021 0322   CMP Latest Ref Rng & Units 03/18/2021 03/17/2021 03/16/2021  Glucose 70 - 99 mg/dL 147(H) 184(H) 137(H)  BUN 8 - 23 mg/dL 93(H) 89(H) 83(H)  Creatinine 0.44 - 1.00 mg/dL 6.83(H) 6.84(H) 6.34(H)  Sodium 135 - 145 mmol/L 138 137 140  Potassium 3.5 - 5.1 mmol/L 4.6 4.5 4.6  Chloride 98 - 111 mmol/L 105 104 108  CO2 22 - 32 mmol/L 21(L) 19(L) 21(L)  Calcium 8.9 - 10.3 mg/dL 8.5(L) 8.7(L) 7.8(L)  Total Protein 6.5 - 8.1 g/dL - - -  Total Bilirubin 0.3 - 1.2 mg/dL - - -  Alkaline Phos 38 - 126 U/L - - -  AST 15 - 41 U/L - - -  ALT 0 - 44 U/L - - -     Radiology Studies: IR Fluoro Guide CV Line Right  Result Date: 03/18/2021 INDICATION: Renal failure EXAM: Tunneled right IJ hemodialysis catheter placement MEDICATIONS: 2 g Ancef IV; The antibiotic was administered within an appropriate time interval prior to skin puncture. ANESTHESIA/SEDATION: Moderate (conscious) sedation was employed during this procedure. A total of Versed 1 mg and Fentanyl 25 mcg was administered intravenously. Moderate Sedation Time: 30 minutes. The patient's level of consciousness and vital signs were monitored continuously by radiology nursing throughout the procedure under my direct supervision. FLUOROSCOPY TIME:  Fluoroscopy Time: 0 minutes 54 seconds (6 mGy). COMPLICATIONS: None immediate. PROCEDURE: Informed written consent was obtained from the patient after a thorough discussion of the procedural risks, benefits and alternatives. All questions were addressed. Maximal Sterile Barrier Technique was utilized including caps, mask, sterile gowns, sterile gloves, sterile drape, hand hygiene and skin antiseptic. A timeout was performed prior to the initiation of the procedure. The right internal jugular vein was evaluated with ultrasound and shown to be  patent. A permanent ultrasound image was obtained and placed in the patient's medical record. Using sterile gel and a sterile probe cover, the right internal jugular vein was entered with a 21 ga needle during real time ultrasound guidance. 0.018 inch guidewire placed and 21 ga needle exchanged for transitional dilator set. Utilizing fluoroscopy, 0.035 inch guidewire advanced through the needle without difficulty. Seriel dilation was performed and peel-away sheath was placed. Attention then turned to the right anterior upper chest. Following local lidocaine administration, the hemodialysis catheter was tunneled from the chest wall to the venotomy site. The catheter was inserted through the peel-away sheath. The tip of the catheter was positioned within the right atrium using fluoroscopic guidance. All lumens of the catheter aspirated and flushed well. The dialysis lumens were locked with Heparin. The catheter was secured to the skin with suture. The insertion site was covered with a Biopatch and sterile dressing. IMPRESSION: 19 cm right IJ tunneled hemodialysis catheter is ready for use. Electronically Signed   By: Miachel Roux M.D.   On: 03/18/2021  11:58   IR US Guide Vasc Access Right  Result Date: 03/18/2021 INDICATION: Renal failure EXAM: Tunneled right IJ hemodialysis catheter placement MEDICATIONS: 2 g Ancef IV; The antibiotic was administered within an appropriate time interval prior to skin puncture. ANESTHESIA/SEDATION: Moderate (conscious) sedation was employed during this procedure. A total of Versed 1 mg and Fentanyl 25 mcg was administered intravenously. Moderate Sedation Time: 30 minutes. The patient's level of consciousness and vital signs were monitored continuously by radiology nursing throughout the procedure under my direct supervision. FLUOROSCOPY TIME:  Fluoroscopy Time: 0 minutes 54 seconds (6 mGy). COMPLICATIONS: None immediate. PROCEDURE: Informed written consent was obtained from the  patient after a thorough discussion of the procedural risks, benefits and alternatives. All questions were addressed. Maximal Sterile Barrier Technique was utilized including caps, mask, sterile gowns, sterile gloves, sterile drape, hand hygiene and skin antiseptic. A timeout was performed prior to the initiation of the procedure. The right internal jugular vein was evaluated with ultrasound and shown to be patent. A permanent ultrasound image was obtained and placed in the patient's medical record. Using sterile gel and a sterile probe cover, the right internal jugular vein was entered with a 21 ga needle during real time ultrasound guidance. 0.018 inch guidewire placed and 21 ga needle exchanged for transitional dilator set. Utilizing fluoroscopy, 0.035 inch guidewire advanced through the needle without difficulty. Seriel dilation was performed and peel-away sheath was placed. Attention then turned to the right anterior upper chest. Following local lidocaine administration, the hemodialysis catheter was tunneled from the chest wall to the venotomy site. The catheter was inserted through the peel-away sheath. The tip of the catheter was positioned within the right atrium using fluoroscopic guidance. All lumens of the catheter aspirated and flushed well. The dialysis lumens were locked with Heparin. The catheter was secured to the skin with suture. The insertion site was covered with a Biopatch and sterile dressing. IMPRESSION: 19 cm right IJ tunneled hemodialysis catheter is ready for use. Electronically Signed   By: Miachel Roux M.D.   On: 03/18/2021 11:58     Scheduled Meds: . aspirin  81 mg Oral Daily  . calcitRIOL  0.25 mcg Oral Once per day on Mon Wed Fri  . carvedilol  3.125 mg Oral BID WC  . Chlorhexidine Gluconate Cloth  6 each Topical Q0600  . fluticasone  2 spray Each Nare Daily  . heparin  5,000 Units Subcutaneous Q8H  . hydrALAZINE  25 mg Oral Q8H  . insulin aspart  0-5 Units Subcutaneous  QHS  . insulin aspart  0-9 Units Subcutaneous TID WC  . insulin detemir  8 Units Subcutaneous BID  . iron polysaccharides  150 mg Oral Daily  . mometasone-formoterol  2 puff Inhalation BID  . rosuvastatin  10 mg Oral Daily  . sevelamer carbonate  800 mg Oral TID WC   Continuous Infusions: . sodium chloride       LOS: 10 days   Time spent: [place], MD [facility] To contact the attending provider between 7A-7P or the covering provider during after hours 7P-7A, please log into the web site [email or web address] and access using universal [place] password for that web site. If you do not have the password, please call the hospital operator.  03/19/2021, 8:24 AM

## 2021-03-19 NOTE — Progress Notes (Signed)
Patient has been accepted at [facility] for outpatient HD on a MWF schedule with a seat time of 6:00am. She needs to arrive at the clinic at 5:40am.  Since Monday, 03/22/21 is a holiday and since her seat time is so early, patient will need to dialyze in the hospital on Monday and will be able to start at the clinic on Wednesday, 03/24/21. If she is medically ready for discharge Monday after HD, she needs to go to [facility] on Tuesday, 5/31 before 3pm to sign paperwork. I have advised patient's husband to call the clinic on Monday or Tuesday to schedule a time for this.  I have alerted Nephrologist and Renal PA, as I will not be in the office on Monday. Patient's husband also aware of the above tentative plan, as is [employer] [facility] [place] [facility]. Patient's husband states he will update patient when he talks with her. Patient's husband will transport.   Alphonzo Cruise, [place] Renal Navigator [phone]

## 2021-03-19 NOTE — Consult Note (Signed)
   [facility] CM Inpatient Consult   03/19/2021  JACE DOWE August 08, 1951 [id number]   [facility] [ACO] Patient:  Medicare CMS DCE  Patient was screened for length of stay [place] [facility] services. Patient is listed to have the transition of care call conducted by the primary care provider in an Embedded practice. Brief chart review for post hospital needs for dialysis noted with Renal Navigator note.  Plan: Will continue to follow for post hospital transition for [place] [facility] needs with the inpatient [facility] team and Renal Navigator.   Please contact for further questions,  Natividad Brood, RN BSN [place] [facility] Liaison  [phone] business mobile phone Toll free office [phone]  Fax number: [phone] [email or web address]

## 2021-03-20 DIAGNOSIS — I5033 Acute on chronic diastolic (congestive) heart failure: Secondary | ICD-10-CM | POA: Diagnosis not present

## 2021-03-20 LAB — RENAL FUNCTION PANEL
Albumin: 2.7 g/dL — ABNORMAL LOW (ref 3.5–5.0)
Anion gap: 10 (ref 5–15)
BUN: 34 mg/dL — ABNORMAL HIGH (ref 8–23)
CO2: 27 mmol/L (ref 22–32)
Calcium: 8.3 mg/dL — ABNORMAL LOW (ref 8.9–10.3)
Chloride: 102 mmol/L (ref 98–111)
Creatinine, Ser: 3.94 mg/dL — ABNORMAL HIGH (ref 0.44–1.00)
GFR, Estimated: 12 mL/min — ABNORMAL LOW (ref >60)
Glucose, Bld: 113 mg/dL — ABNORMAL HIGH (ref 70–99)
Phosphorus: 4.1 mg/dL (ref 2.5–4.6)
Potassium: 3.9 mmol/L (ref 3.5–5.1)
Sodium: 139 mmol/L (ref 135–145)

## 2021-03-20 LAB — CBC WITH DIFFERENTIAL/PLATELET
Abs Immature Granulocytes: 0.05 10*3/uL (ref 0.00–0.07)
Basophils Absolute: 0 10*3/uL (ref 0.0–0.1)
Basophils Relative: 0 %
Eosinophils Absolute: 0.2 10*3/uL (ref 0.0–0.5)
Eosinophils Relative: 2 %
HCT: 24.1 % — ABNORMAL LOW (ref 36.0–46.0)
Hemoglobin: 7.9 g/dL — ABNORMAL LOW (ref 12.0–15.0)
Immature Granulocytes: 1 %
Lymphocytes Relative: 31 %
Lymphs Abs: 2.7 10*3/uL (ref 0.7–4.0)
MCH: 28.2 pg (ref 26.0–34.0)
MCHC: 32.8 g/dL (ref 30.0–36.0)
MCV: 86.1 fL (ref 80.0–100.0)
Monocytes Absolute: 1.4 10*3/uL — ABNORMAL HIGH (ref 0.1–1.0)
Monocytes Relative: 17 %
Neutro Abs: 4.2 10*3/uL (ref 1.7–7.7)
Neutrophils Relative %: 49 %
Platelets: 356 10*3/uL (ref 150–400)
RBC: 2.8 MIL/uL — ABNORMAL LOW (ref 3.87–5.11)
RDW: 14.8 % (ref 11.5–15.5)
WBC: 8.6 10*3/uL (ref 4.0–10.5)
nRBC: 0 % (ref 0.0–0.2)

## 2021-03-20 LAB — GLUCOSE, CAPILLARY
Glucose-Capillary: 122 mg/dL — ABNORMAL HIGH (ref 70–99)
Glucose-Capillary: 227 mg/dL — ABNORMAL HIGH (ref 70–99)
Glucose-Capillary: 198 mg/dL — ABNORMAL HIGH (ref 70–99)
Glucose-Capillary: 162 mg/dL — ABNORMAL HIGH (ref 70–99)

## 2021-03-20 MED ORDER — CHLORHEXIDINE GLUCONATE CLOTH 2 % EX PADS
6.0000 | MEDICATED_PAD | Freq: Every day | CUTANEOUS | Status: DC
  Administered 2021-03-21 – 2021-03-24 (×4): 6 via TOPICAL

## 2021-03-20 MED ORDER — FUROSEMIDE 10 MG/ML IJ SOLN
80.0000 mg | Freq: Once | INTRAMUSCULAR | Status: DC
  Filled 2021-03-20: qty 8

## 2021-03-20 MED ORDER — HEPARIN SODIUM (PORCINE) 1000 UNIT/ML IJ SOLN
INTRAMUSCULAR | Status: AC
  Administered 2021-03-20: 1000 [IU] via INTRAVENOUS
  Filled 2021-03-20: qty 4

## 2021-03-20 MED ORDER — DARBEPOETIN ALFA 60 MCG/0.3ML IJ SOSY
60.0000 ug | PREFILLED_SYRINGE | INTRAMUSCULAR | Status: DC
  Filled 2021-03-20: qty 0.3

## 2021-03-20 MED ORDER — DARBEPOETIN ALFA 60 MCG/0.3ML IJ SOSY
PREFILLED_SYRINGE | INTRAMUSCULAR | Status: AC
  Administered 2021-03-20: 60 ug via SUBCUTANEOUS
  Filled 2021-03-20: qty 0.3

## 2021-03-20 MED ORDER — FUROSEMIDE 10 MG/ML IJ SOLN
80.0000 mg | Freq: Once | INTRAMUSCULAR | Status: AC
  Administered 2021-03-20: 80 mg via INTRAVENOUS
  Filled 2021-03-20: qty 8

## 2021-03-20 NOTE — Progress Notes (Signed)
PROGRESS NOTE   Carolyn Russo  QIW:[id number] DOB: [birth year] DOA: 03/09/2021 PCP: [name], MD  Brief Narrative:  19 community dwelling white female CKD 4 follows Dr. Cherlyn Cushing baseline creatinine 4.09--previously has refused dialysis-referred to [facility] for kidney transplant eval IDDM IDA HTN Asthma CAD-cardiac cath 61218---30% LAD stenosis  Developed SOB 03/09/2021-CXR pleural effusions-Rx Lasix 60 twice daily and was admitted Became bradycardic hypotensive on attempt at right basilic vein exposure 4/17 Cardiology reconsulted and have since s/o  Eventually [facility] placed 5/26 Started HD successfully Being set-up for OP HD and likely can d/c 5/30 Mild hypoxia still  Hospital-Problem based course  Perioperative bradycardia hypotension 5/24 Received calcium adrenaline atropine No further issues on monitors Cardiology S/O---5/26 appreciate input Continue Coreg 3.125 Declared end-stage renal disease Diuresis limited-TDC right chest placed 5/26-pain resolved form TDC placement c tramadol IHD initiated 5/26-rest [esa/binders] as per nephrology Hypoxia-expected and probably from volume overload  desat screen again today If still needing oxygen 5/30 post-HD, will re-xray lungs Anemia of likely renal disease Iron 43 sat ratios 14-IV iron caused nausea Started Auryxia 5/25 continue-recheck iron levels in 3 weeks Leukocytosis White count down to 8.6 on its own-monitor periodically HFpEF Cardiac cath 6/18--40% LAD stenosis no intervention at that time See above discussion Hydralazine 25 every 8, resumed Coreg 3.125 on 5/25 As needed hydralazine IV if above 180 D/c Hyzaar Lasix per nephrology  Insulin-dependent DM TY 2 A1c 6.2 CBG 122-298 s Cut back Levemir to 6 sliding scale 70/30 insulin on hold   DVT prophylaxis: Heparin Code Status: Full Family Communication: Dr. [name] updated at the bedside on 5/28 in detail She will be seen by PT  Hopefully can dialyses 5/30  and d/c per nephro after HD Disposition:  Status is: Inpatient  Remains inpatient appropriate because:Hemodynamically unstable, Persistent severe electrolyte disturbances and Ongoing diagnostic testing needed not appropriate for outpatient work up   Dispo: The patient is from: Home              Anticipated d/c is to: Home expect will be here at least through the weekend clip process etc. needs to be performed              Patient currently is not medically stable to d/c.   Difficult to place patient No  Consultants:   Cardiology, nephrology  Procedures: none  Antimicrobials: no    Subjective:   Awake coherent In nad Still wearing oxygen No fever no chills  Objective: Vitals:   03/20/21 0833 03/20/21 1126 03/20/21 1359 03/20/21 1401  BP:  132/72 128/68 128/68  Pulse:  73 68   Resp:  20    Temp:  98.2 F (36.8 C)    TempSrc:  Oral    SpO2: 97% 100%    Weight:      Height:        Intake/Output Summary (Last 24 hours) at 03/20/2021 1429 Last data filed at 03/20/2021 0342 Gross per 24 hour  Intake --  Output 2100 ml  Net -2100 ml   Filed Weights   03/19/21 0413 03/19/21 1851 03/20/21 0215  Weight: 96.7 kg 95.8 kg 93.6 kg    Examination:  Coherent pleasant  Right chest access in place does not seem extremely tender Decreased AE but no rales Abdomen soft no rebound no guarding no tenderness Trace lower extremity edema  Data Reviewed: personally reviewed   CBC    Component Value Date/Time   WBC 8.6 03/20/2021 0224   RBC 2.80 (L) 03/20/2021 0224  HGB 7.9 (L) 03/20/2021 0224   HGB 8.0 (L) 02/17/2021 1146   HCT 24.1 (L) 03/20/2021 0224   HCT 24.0 (L) 02/17/2021 1146   PLT 356 03/20/2021 0224   PLT 376 02/17/2021 1146   MCV 86.1 03/20/2021 0224   MCV 85 02/17/2021 1146   MCH 28.2 03/20/2021 0224   MCHC 32.8 03/20/2021 0224   RDW 14.8 03/20/2021 0224   RDW 13.4 02/17/2021 1146   LYMPHSABS 2.7 03/20/2021 0224   MONOABS 1.4 (H) 03/20/2021 0224    EOSABS 0.2 03/20/2021 0224   BASOSABS 0.0 03/20/2021 0224   CMP Latest Ref Rng & Units 03/20/2021 03/19/2021 03/18/2021  Glucose 70 - 99 mg/dL 113(H) 81 147(H)  BUN 8 - 23 mg/dL 34(H) 62(H) 93(H)  Creatinine 0.44 - 1.00 mg/dL 3.94(H) 5.47(H) 6.83(H)  Sodium 135 - 145 mmol/L 139 140 138  Potassium 3.5 - 5.1 mmol/L 3.9 4.2 4.6  Chloride 98 - 111 mmol/L 102 107 105  CO2 22 - 32 mmol/L 27 24 21(L)  Calcium 8.9 - 10.3 mg/dL 8.3(L) 8.1(L) 8.5(L)  Total Protein 6.5 - 8.1 g/dL - - -  Total Bilirubin 0.3 - 1.2 mg/dL - - -  Alkaline Phos 38 - 126 U/L - - -  AST 15 - 41 U/L - - -  ALT 0 - 44 U/L - - -     Radiology Studies: No results found.   Scheduled Meds: . aspirin  81 mg Oral Daily  . calcitRIOL  0.25 mcg Oral Once per day on Mon Wed Fri  . carvedilol  3.125 mg Oral BID WC  . Chlorhexidine Gluconate Cloth  6 each Topical Daily  . darbepoetin (ARANESP) injection - NON-DIALYSIS  60 mcg Subcutaneous Q Sat-1800  . fluticasone  2 spray Each Nare Daily  . [START ON 03/21/2021] furosemide  80 mg Intravenous Once  . heparin  5,000 Units Subcutaneous Q8H  . hydrALAZINE  25 mg Oral Q8H  . insulin aspart  0-5 Units Subcutaneous QHS  . insulin aspart  0-9 Units Subcutaneous TID WC  . insulin detemir  6 Units Subcutaneous BID  . iron polysaccharides  150 mg Oral Daily  . mometasone-formoterol  2 puff Inhalation BID  . pneumococcal 23 valent vaccine  0.5 mL Intramuscular Tomorrow-1000  . rosuvastatin  10 mg Oral Daily  . sevelamer carbonate  800 mg Oral TID WC   Continuous Infusions: . sodium chloride       LOS: 11 days   Time spent: [place], MD [facility] To contact the attending provider between 7A-7P or the covering provider during after hours 7P-7A, please log into the web site [email or web address] and access using universal [place] password for that web site. If you do not have the password, please call the hospital operator.  03/20/2021, 2:29 PM

## 2021-03-20 NOTE — Progress Notes (Signed)
[facility] Progress Note    Assessment/ Plan:   1. CKD with progression to ESRD:  s/p attempted AVF 5/24 which was aborted after acute onset of bradycardia and hypotension with EKG changes in the OR.  Per vascular could consider a first stage right basilic vein transposition under local anesthesia if cleared for same.  First HD on 5/26 after tunneled catheter with IR - HD today - will go ahead with tx as she has been short of breath - Next HD after today will be on 5/30 per a MWF schedule  - She has an outpatient HD unit MWF as below  2. HFpEF exacerbation:  volume status improved.  Continue lasix intermittently and now on HD.  Lasix 80 mg IV once now and in am  3. Bradycardia - cardiology consulted and they have lowered her beta blocker.  No tele strips available from OR  4. CAD - per cardiology.  Note reported acute bradycardia with anesthesia  5. HTN: - continue current regimen - optimize volume with HD   6. IDDM: - Continue management per primary team  7. Anemia CKD and iron deficiency  - s/p iv iron -  paused per patient request as had nausea and headache with the second dose. On iron PO. - Start aranesp 60 mcg on 5/28. Discussed with patient and she agrees.  8. Metabolic bone disease - continue renvela. Started HD. Continue calcitriol   Disposition - she has an outpatient dialysis unit MWF at [facility].  She would need to dialyze here on 5/30 then ok for discharge after that from a renal standpoint.       Subjective:    last HD on 5/27 with 1 kg UF.  She had 1.2 L uop over 5/27 as well.  Still doesn't feel great.  Review of systems:   Had nausea yesterday   Denies shortness of breath or chest pain Eating breakfast now Went without oxygen some during day yesterday    Objective:   BP (!) 149/69 (BP Location: Left Arm)   Pulse 73   Temp 98.4 F (36.9 C) (Oral)   Resp 18   Ht 5\' 3"  (1.6 m)   Wt 93.6 kg   SpO2 94%   BMI  36.55 kg/m   Intake/Output Summary (Last 24 hours) at 03/20/2021 0750 Last data filed at 03/20/2021 9937 Gross per 24 hour  Intake 240 ml  Output 2200 ml  Net -1960 ml   Weight change: -3.3 kg  Physical Exam:  General adult female in bed in no acute distress HEENT normocephalic atraumatic extraocular movements intact sclera anicteric Neck supple trachea midline Lungs clear to auscultation; normal work of breathing at rest but increased with exertion; on 2 liters Heart S1S2 no rub Abdomen soft nontender nondistended Extremities trace to 1+ edema bilateral lower extremities Psych normal mood and affect Neuro - alert and oriented x 3 provides hx and follows commands Access - RIJ tunneled catheter   Imaging: IR Fluoro Guide CV Line Right  Result Date: 03/18/2021 INDICATION: Renal failure EXAM: Tunneled right IJ hemodialysis catheter placement MEDICATIONS: 2 g Ancef IV; The antibiotic was administered within an appropriate time interval prior to skin puncture. ANESTHESIA/SEDATION: Moderate (conscious) sedation was employed during this procedure. A total of Versed 1 mg and Fentanyl 25 mcg was administered intravenously. Moderate Sedation Time: 30 minutes. The patient's level of consciousness and vital signs were monitored continuously by radiology nursing throughout the procedure under my direct supervision. FLUOROSCOPY TIME:  Fluoroscopy Time: 0 minutes 54 seconds (6 mGy). COMPLICATIONS: None immediate. PROCEDURE: Informed written consent was obtained from the patient after a thorough discussion of the procedural risks, benefits and alternatives. All questions were addressed. Maximal Sterile Barrier Technique was utilized including caps, mask, sterile gowns, sterile gloves, sterile drape, hand hygiene and skin antiseptic. A timeout was performed prior to the initiation of the procedure. The right internal jugular vein was evaluated with ultrasound and shown to be patent. A permanent ultrasound  image was obtained and placed in the patient's medical record. Using sterile gel and a sterile probe cover, the right internal jugular vein was entered with a 21 ga needle during real time ultrasound guidance. 0.018 inch guidewire placed and 21 ga needle exchanged for transitional dilator set. Utilizing fluoroscopy, 0.035 inch guidewire advanced through the needle without difficulty. Seriel dilation was performed and peel-away sheath was placed. Attention then turned to the right anterior upper chest. Following local lidocaine administration, the hemodialysis catheter was tunneled from the chest wall to the venotomy site. The catheter was inserted through the peel-away sheath. The tip of the catheter was positioned within the right atrium using fluoroscopic guidance. All lumens of the catheter aspirated and flushed well. The dialysis lumens were locked with Heparin. The catheter was secured to the skin with suture. The insertion site was covered with a Biopatch and sterile dressing. IMPRESSION: 19 cm right IJ tunneled hemodialysis catheter is ready for use. Electronically Signed   By: Miachel Roux M.D.   On: 03/18/2021 11:58   IR US Guide Vasc Access Right  Result Date: 03/18/2021 INDICATION: Renal failure EXAM: Tunneled right IJ hemodialysis catheter placement MEDICATIONS: 2 g Ancef IV; The antibiotic was administered within an appropriate time interval prior to skin puncture. ANESTHESIA/SEDATION: Moderate (conscious) sedation was employed during this procedure. A total of Versed 1 mg and Fentanyl 25 mcg was administered intravenously. Moderate Sedation Time: 30 minutes. The patient's level of consciousness and vital signs were monitored continuously by radiology nursing throughout the procedure under my direct supervision. FLUOROSCOPY TIME:  Fluoroscopy Time: 0 minutes 54 seconds (6 mGy). COMPLICATIONS: None immediate. PROCEDURE: Informed written consent was obtained from the patient after a thorough  discussion of the procedural risks, benefits and alternatives. All questions were addressed. Maximal Sterile Barrier Technique was utilized including caps, mask, sterile gowns, sterile gloves, sterile drape, hand hygiene and skin antiseptic. A timeout was performed prior to the initiation of the procedure. The right internal jugular vein was evaluated with ultrasound and shown to be patent. A permanent ultrasound image was obtained and placed in the patient's medical record. Using sterile gel and a sterile probe cover, the right internal jugular vein was entered with a 21 ga needle during real time ultrasound guidance. 0.018 inch guidewire placed and 21 ga needle exchanged for transitional dilator set. Utilizing fluoroscopy, 0.035 inch guidewire advanced through the needle without difficulty. Seriel dilation was performed and peel-away sheath was placed. Attention then turned to the right anterior upper chest. Following local lidocaine administration, the hemodialysis catheter was tunneled from the chest wall to the venotomy site. The catheter was inserted through the peel-away sheath. The tip of the catheter was positioned within the right atrium using fluoroscopic guidance. All lumens of the catheter aspirated and flushed well. The dialysis lumens were locked with Heparin. The catheter was secured to the skin with suture. The insertion site was covered with a Biopatch and sterile dressing. IMPRESSION: 19 cm right IJ tunneled hemodialysis catheter is ready for  use. Electronically Signed   By: Miachel Roux M.D.   On: 03/18/2021 11:58    Labs: BMET Recent Labs  Lab 03/14/21 0159 03/15/21 0729 03/16/21 0332 03/17/21 0753 03/18/21 0322 03/19/21 0752 03/20/21 0224  NA 138 140 140 137 138 140 139  K 4.2 4.0 4.6 4.5 4.6 4.2 3.9  CL 105 107 108 104 105 107 102  CO2 21* 23 21* 19* 21* 24 27  GLUCOSE 143* 106* 137* 184* 147* 81 113*  BUN 75* 77* 83* 89* 93* 62* 34*  CREATININE 5.63* 5.90* 6.34* 6.84*  6.83* 5.47* 3.94*  CALCIUM 7.8* 7.9* 7.8* 8.7* 8.5* 8.1* 8.3*  PHOS  --   --  7.0* 7.1* 7.5* 5.0* 4.1   CBC Recent Labs  Lab 03/16/21 1443 03/17/21 0753 03/18/21 0322 03/19/21 0752 03/20/21 0224  WBC 15.7* 15.1* 10.8* 10.2 8.6  NEUTROABS 13.7* 12.3* 7.0  --  4.2  HGB 7.5* 8.2* 7.6* 7.4* 7.9*  HCT 22.8* 24.7* 22.9* 22.3* 24.1*  MCV 86.7 86.1 85.4 85.4 86.1  PLT 407* 440* 396 365 356    Medications:    . aspirin  81 mg Oral Daily  . calcitRIOL  0.25 mcg Oral Once per day on Mon Wed Fri  . carvedilol  3.125 mg Oral BID WC  . Chlorhexidine Gluconate Cloth  6 each Topical Daily  . fluticasone  2 spray Each Nare Daily  . heparin  5,000 Units Subcutaneous Q8H  . hydrALAZINE  25 mg Oral Q8H  . insulin aspart  0-5 Units Subcutaneous QHS  . insulin aspart  0-9 Units Subcutaneous TID WC  . insulin detemir  6 Units Subcutaneous BID  . iron polysaccharides  150 mg Oral Daily  . mometasone-formoterol  2 puff Inhalation BID  . pneumococcal 23 valent vaccine  0.5 mL Intramuscular Tomorrow-1000  . rosuvastatin  10 mg Oral Daily  . sevelamer carbonate  800 mg Oral TID WC    Claudia Desanctis, MD  03/20/2021  8:11 AM

## 2021-03-21 ENCOUNTER — Inpatient Hospital Stay (HOSPITAL_COMMUNITY): Payer: Medicare Other

## 2021-03-21 DIAGNOSIS — I5033 Acute on chronic diastolic (congestive) heart failure: Secondary | ICD-10-CM | POA: Diagnosis not present

## 2021-03-21 LAB — RENAL FUNCTION PANEL
Albumin: 2.6 g/dL — ABNORMAL LOW (ref 3.5–5.0)
Anion gap: 7 (ref 5–15)
BUN: 19 mg/dL (ref 8–23)
CO2: 29 mmol/L (ref 22–32)
Calcium: 8.2 mg/dL — ABNORMAL LOW (ref 8.9–10.3)
Chloride: 100 mmol/L (ref 98–111)
Creatinine, Ser: 3.45 mg/dL — ABNORMAL HIGH (ref 0.44–1.00)
GFR, Estimated: 14 mL/min — ABNORMAL LOW (ref >60)
Glucose, Bld: 139 mg/dL — ABNORMAL HIGH (ref 70–99)
Phosphorus: 3.4 mg/dL (ref 2.5–4.6)
Potassium: 3.8 mmol/L (ref 3.5–5.1)
Sodium: 136 mmol/L (ref 135–145)

## 2021-03-21 LAB — GLUCOSE, CAPILLARY
Glucose-Capillary: 143 mg/dL — ABNORMAL HIGH (ref 70–99)
Glucose-Capillary: 208 mg/dL — ABNORMAL HIGH (ref 70–99)
Glucose-Capillary: 220 mg/dL — ABNORMAL HIGH (ref 70–99)
Glucose-Capillary: 115 mg/dL — ABNORMAL HIGH (ref 70–99)

## 2021-03-21 IMAGING — DX DG CHEST 1V PORT
1 series · 1 of 1 positions shown · non-contrast
Comparison: [DATE]

CLINICAL DATA: Recent aspiration

EXAM:
PORTABLE CHEST 1 VIEW

[chest ap]
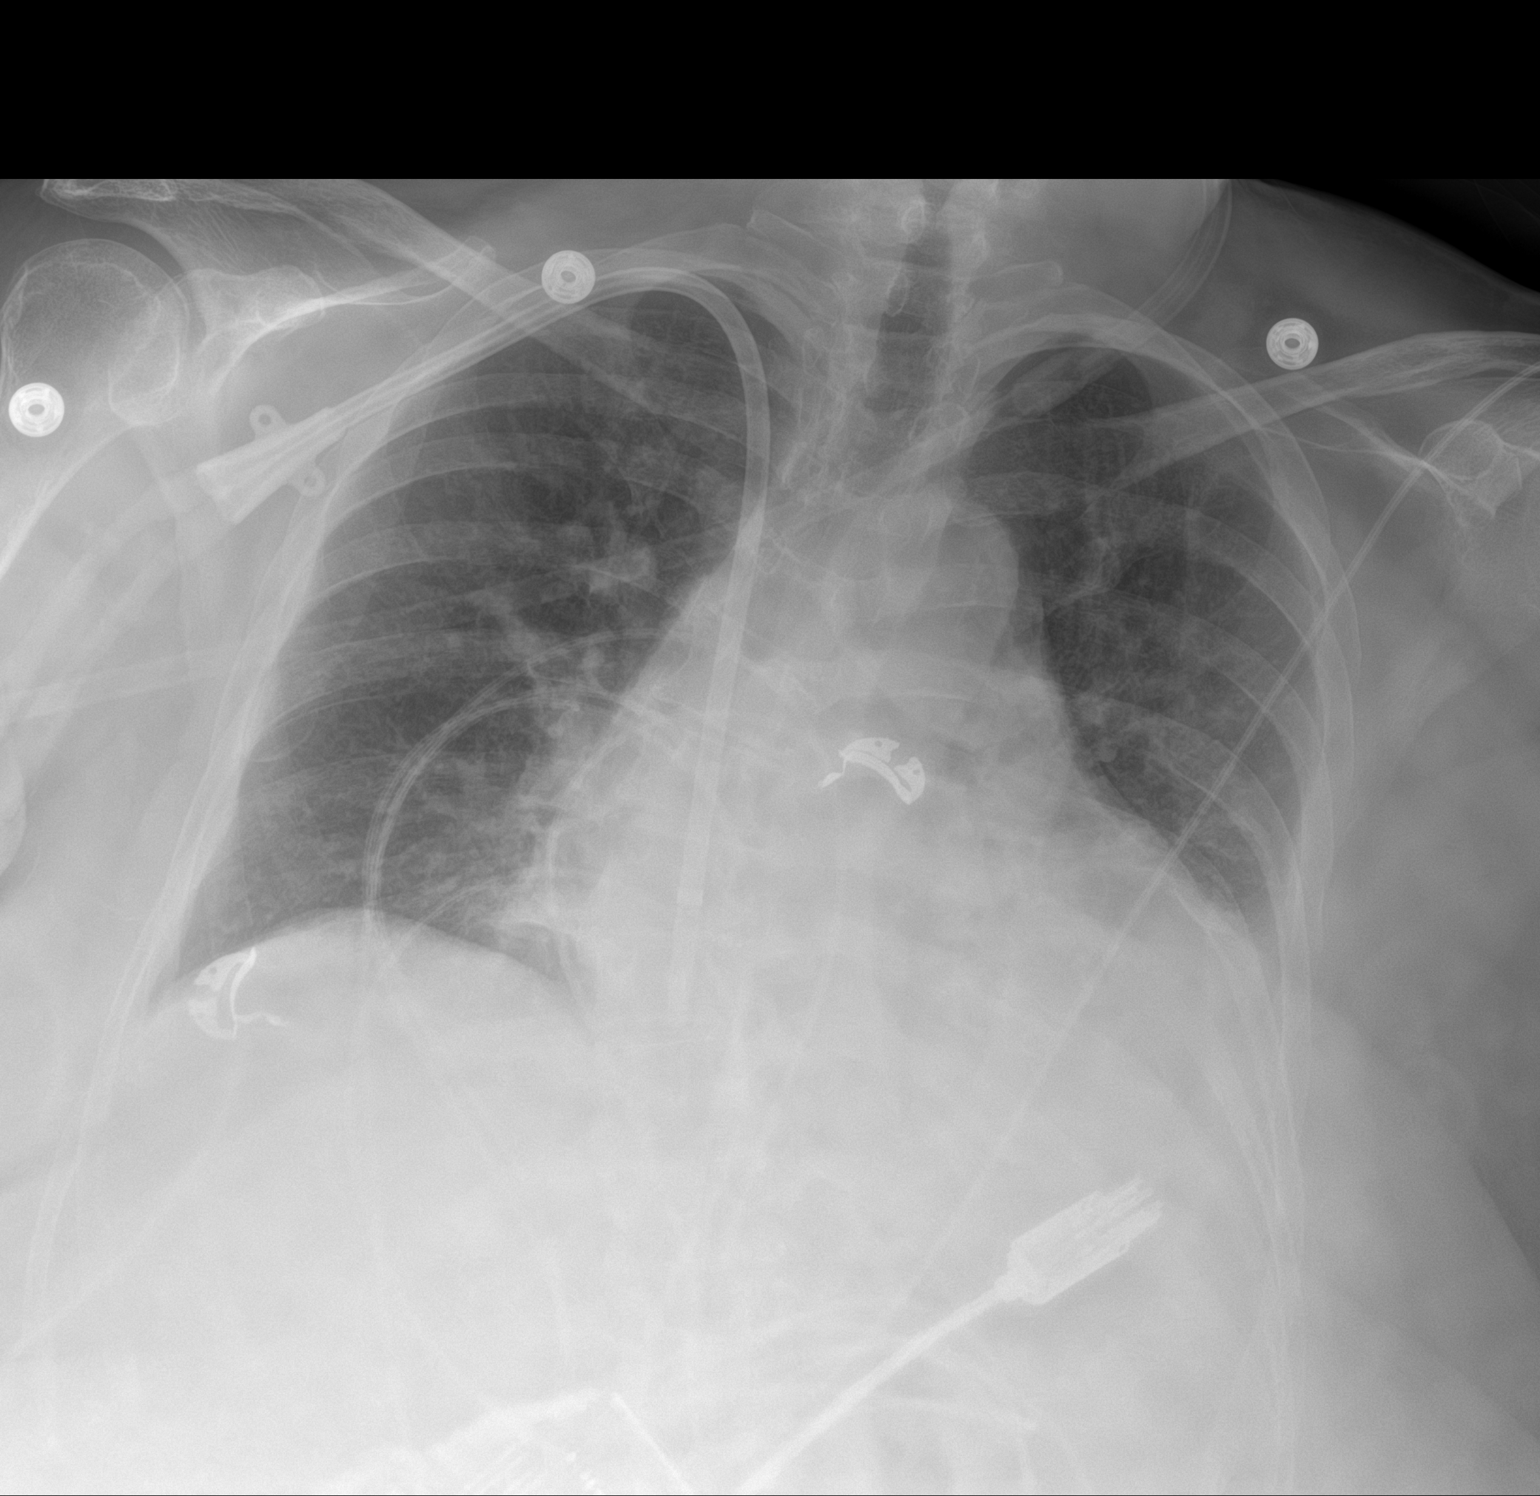

[1 of 1 positions shown; findings below may reference images not displayed]

FINDINGS: Cardiac shadow is enlarged but stable. New right jugular dialysis
catheter is noted in satisfactory position. Right lung is clear.
Left lung demonstrates some mild basilar opacity increased when
compared with the prior exam. No bony abnormality is noted.
IMPRESSION: Increasing left basilar airspace opacity consistent with focal
infiltrate.

## 2021-03-21 MED ORDER — SODIUM CHLORIDE 0.9 % IV SOLN
3.0000 g | Freq: Once | INTRAVENOUS | Status: AC
  Administered 2021-03-22: 3 g via INTRAVENOUS
  Filled 2021-03-21: qty 3

## 2021-03-21 MED ORDER — CHLORHEXIDINE GLUCONATE CLOTH 2 % EX PADS: 6.0000 | MEDICATED_PAD | Freq: Every day | CUTANEOUS | Status: DC

## 2021-03-21 MED ORDER — SODIUM CHLORIDE 0.9 % IV SOLN
3.0000 g | Freq: Two times a day (BID) | INTRAVENOUS | Status: DC
  Administered 2021-03-22 – 2021-03-23 (×2): 3 g via INTRAVENOUS
  Filled 2021-03-21 (×3): qty 8
  Filled 2021-03-21: qty 3

## 2021-03-21 MED ORDER — CAMPHOR-MENTHOL 0.5-0.5 % EX LOTN
TOPICAL_LOTION | CUTANEOUS | Status: DC | PRN
  Filled 2021-03-21: qty 222

## 2021-03-21 NOTE — Evaluation (Signed)
Physical Therapy Evaluation Patient Details Name: Carolyn Russo MRN: [id number] DOB: 07-05-1951 Today's Date: 03/21/2021   History of Present Illness  Pt is a 70 y/o female admitted secondary to SOB and bilateral LE edema, found to have acute on chronic diastolic CHF. Pt is now s/p first stage right basilic vein transposition on 5/24 and tunneled HD catheter placement on 5/26. PMH including but not limited to CKD stage IV or V, insulin-dependent diabetes mellitus, CAD, iron deficiency anemia, hypertension, and asthma.    Clinical Impression  Pt presented supine in bed with HOB elevated, awake and willing to participate in therapy session. Prior to admission, pt reported that she was independent with all functional mobility and ADLs. Pt lives alone in a single level home with a level entry. She shook her head "no" when asked if she had any family or friends that would be available to assist her if necessary. At the time of evaluation, pt on RA with SpO2 maintaining >92% throughout and HR stable at 73-75 bpm. Pt able to perform bed mobility with supervision and maintain an upright sitting position at EOB without difficulties. However, she refused further mobility at this time, stating that she has been OOB since admission and that she felt she was at her baseline in regards to functional mobility. PT will continue to follow-up with pt acutely to further assess OOB mobility and assess for any DME needs.     Follow Up Recommendations No PT follow up    Equipment Recommendations  Other (comment) (pending further mobility assessment)    Recommendations for Other Services       Precautions / Restrictions Precautions Precautions: Fall Restrictions Weight Bearing Restrictions: No      Mobility  Bed Mobility Overal bed mobility: Needs Assistance Bed Mobility: Supine to Sit;Sit to Supine     Supine to sit: Supervision Sit to supine: Supervision   General bed mobility comments: pt able to  easily achieve upright sitting position at EOB towards her right side without use of hand rails; HOB elevated; no assistance needed to return to supine    Transfers                 General transfer comment: pt refusing further mobility at this time; she reported that she has been OOB during her admission and felt that she was at her normal, functional baseline  Ambulation/Gait             General Gait Details: pt refusing further mobility at this time; she reported that she has been OOB during her admission and felt that she was at her normal, functional baseline  Stairs            Wheelchair Mobility    Modified Rankin (Stroke Patients Only)       Balance Overall balance assessment: Needs assistance Sitting-balance support: No upper extremity supported;Feet supported Sitting balance-Leahy Scale: Good                                       Pertinent Vitals/Pain Pain Assessment: No/denies pain    Home Living Family/patient expects to be discharged to:: Private residence Living Arrangements: Alone Available Help at Discharge: Other (Comment) (she reported no family/friends) Type of Home: Apartment Home Access: Level entry     Home Layout: One level Home Equipment: None      Prior Function Level of Independence: Independent  Hand Dominance        Extremity/Trunk Assessment   Upper Extremity Assessment Upper Extremity Assessment: Overall WFL for tasks assessed    Lower Extremity Assessment Lower Extremity Assessment: Overall WFL for tasks assessed    Cervical / Trunk Assessment Cervical / Trunk Assessment: Normal  Communication   Communication: No difficulties  Cognition Arousal/Alertness: Awake/alert Behavior During Therapy: Flat affect Overall Cognitive Status: Within Functional Limits for tasks assessed                                        General Comments      Exercises      Assessment/Plan    PT Assessment Patient needs continued PT services  PT Problem List Decreased mobility;Decreased safety awareness;Decreased knowledge of precautions       PT Treatment Interventions DME instruction;Gait training;Stair training;Therapeutic activities;Functional mobility training;Therapeutic exercise;Balance training;Neuromuscular re-education;Patient/family education    PT Goals (Current goals can be found in the Care Plan section)  Acute Rehab PT Goals Patient Stated Goal: "home tomorrow afternoon" PT Goal Formulation: With patient Time For Goal Achievement: 04/04/21 Potential to Achieve Goals: Good    Frequency Min 3X/week   Barriers to discharge Decreased caregiver support      Co-evaluation               AM-PAC PT "6 Clicks" Mobility  Outcome Measure Help needed turning from your back to your side while in a flat bed without using bedrails?: None Help needed moving from lying on your back to sitting on the side of a flat bed without using bedrails?: None Help needed moving to and from a bed to a chair (including a wheelchair)?: None Help needed standing up from a chair using your arms (e.g., wheelchair or bedside chair)?: None Help needed to walk in hospital room?: A Little Help needed climbing 3-5 steps with a railing? : A Little 6 Click Score: 22    End of Session   Activity Tolerance: Patient tolerated treatment well Patient left: in bed;with call bell/phone within reach Nurse Communication: Mobility status PT Visit Diagnosis: Other abnormalities of gait and mobility (R26.89)    Time: 3254-9826 PT Time Calculation (min) (ACUTE ONLY): 10 min   Charges:   PT Evaluation $PT Eval Low Complexity: 1 Low          Eduard Clos, PT, DPT  Acute Rehabilitation Services Pager [phone] Office [place] 03/21/2021, 9:30 AM

## 2021-03-21 NOTE — Progress Notes (Addendum)
Pt and spouse called out to front desk. stated that patient was choking. Upon arrival to the room patient was sitting on side of the bed, spouse Mr. Calixte explained that pt ate collards with vinegar that choked patient after one bite. spouse is concerned that pt aspirated. Paged Md to make aware. Bedside Report was given. Patient asked for soup writer  Ordered it.

## 2021-03-21 NOTE — Progress Notes (Signed)
Pharmacy Antibiotic Note  Carolyn Russo is a 70 y.o. female admitted on 03/09/2021 with pneumonia.  Pharmacy has been consulted for unasyn dosing.  Plan: Unasyn 3gm IV q12 hours F/u renal function, cultures and clinical course  Height: 5\' 3"  (160 cm) Weight: 90.9 kg (200 lb 6.4 oz) IBW/kg (Calculated) : 52.4  Temp (24hrs), Avg:98.6 F (37 C), Min:97.8 F (36.6 C), Max:99.7 F (37.6 C)  Recent Labs  Lab 03/16/21 1443 03/17/21 0753 03/18/21 0322 03/19/21 0752 03/20/21 0224 03/21/21 0609  WBC 15.7* 15.1* 10.8* 10.2 8.6  --   CREATININE  --  6.84* 6.83* 5.47* 3.94* 3.45*    Estimated Creatinine Clearance: 15.8 mL/min (A) (by C-G formula based on SCr of 3.45 mg/dL (H)).    Allergies  Allergen Reactions  . Food Anaphylaxis    Peanuts; Almonds  . Other Shortness Of Breath    Peanuts; Almonds  . Wheat Bran Shortness Of Breath  . Statins Itching and Other (See Comments)    Generalized aches  . Iodine Other (See Comments)    The patient denied this allergy to me on 03/16/2021  . Shellfish Allergy Other (See Comments)    Mouth gets raw  . Sitagliptin     Other reaction(s): Unknown  . Tetracycline Other (See Comments)    Raw mouth  . Tetracyclines & Related Itching  . Contrast Media [Iodinated Diagnostic Agents] Rash    Happened during CT scan over 30 years ago    Thank you for allowing pharmacy to be a part of this patient's care.  Beverlee Nims 03/21/2021 10:53 PM

## 2021-03-21 NOTE — Progress Notes (Signed)
PROGRESS NOTE   Carolyn Russo  JJH:[id number] DOB: [birth year] DOA: 03/09/2021 PCP: [name], MD  Brief Narrative:  85 community dwelling white female CKD 4 follows Dr. Cherlyn Cushing baseline creatinine 4.09--previously has refused dialysis-referred to [facility] for kidney [facility] eval IDDM IDA HTN Asthma CAD-cardiac cath 61218---30% LAD stenosis  Developed SOB 03/09/2021-CXR pleural effusions-Rx Lasix 60 twice daily and was admitted Became bradycardic hypotensive on attempt at right basilic vein exposure 8/18 Cardiology reconsulted and have since s/o  Eventually [facility] placed 5/26 Started HD successfully Being set-up for OP HD and likely can d/c 5/30 Mild hypoxia still  Hospital-Problem based course  Perioperative bradycardia hypotension 5/24 Received calcium adrenaline atropine-resolved--Cardiology S/O---5/26 appreciate input Continue Coreg 3.125 Declared end-stage renal disease-has outpatient spot MWF Diuresis limited-TDC right chest placed 5/26-pain resolved form TDC placement c tramadol IHD initiated 5/26-rest [esa/binders] as per nephrology Lasix 80 TTS S per nephrologist Hypoxia-expected and probably from volume overload  desat screen again today If still needing oxygen 5/30 post-HD, will re-xray lungs Anemia of likely renal disease Iron 43 sat ratios 14-IV iron caused nausea Started Auryxia 5/25 continue-recheck iron levels in 3 weeks Leukocytosis Resolved-no further work-up-periodic labs HFpEF Cardiac cath 6/18--40% LAD stenosis no intervention at that time See above discussion Hydralazine 25 every 8, resumed Coreg 3.125 on 5/25 As needed hydralazine IV if above 180 D/c Hyzaar Insulin-dependent DM TY 2 A1c 6.2 CBG 1 43-1 62 Cut back Levemir to 6 sliding scale 70/30 insulin on hold   DVT prophylaxis: Heparin Code Status: Full Family Communication: Likely discharge on 5/30 post HD-no family present today Disposition:  Status is: Inpatient  Remains  inpatient appropriate because:Hemodynamically unstable, Persistent severe electrolyte disturbances and Ongoing diagnostic testing needed not appropriate for outpatient work up   Dispo: The patient is from: Home              Anticipated d/c is to: Home expect will be here at least through the weekend clip process etc. needs to be performed              Patient currently is not medically stable to d/c.   Difficult to place patient No  Consultants:   Cardiology, nephrology  Procedures: none  Antimicrobials: no    Subjective:  Still wearing oxygen Sats off of it are 95% Does not feel swollen Does not like the food Otherwise no real issue  Has not been out of bed-discussed with therapy today who will see her Objective: Vitals:   03/20/21 2350 03/21/21 0331 03/21/21 0756 03/21/21 0813  BP: (!) 150/59 (!) 142/68 (!) 147/67   Pulse: 75 69 72   Resp: 18 18 16    Temp: 99.1 F (37.3 C) 98.3 F (36.8 C) 99.7 F (37.6 C)   TempSrc: Oral Oral Oral   SpO2: 94% 100% 99% 99%  Weight:  90.9 kg    Height:        Intake/Output Summary (Last 24 hours) at 03/21/2021 5631 Last data filed at 03/20/2021 2100 Gross per 24 hour  Intake 240 ml  Output 1700 ml  Net -1460 ml   Filed Weights   03/20/21 1440 03/20/21 1805 03/21/21 0331  Weight: 92.7 kg 91 kg 90.9 kg    Examination:  Pleasant slightly hard of hearing Obese black female no distress Chest clear no rales No lower extremity edema S1-S2 with no murmur Access in right upper chest Abdomen soft no rebound Psych euthymic  Data Reviewed: personally reviewed   CBC    Component Value  Date/Time   WBC 8.6 03/20/2021 0224   RBC 2.80 (L) 03/20/2021 0224   HGB 7.9 (L) 03/20/2021 0224   HGB 8.0 (L) 02/17/2021 1146   HCT 24.1 (L) 03/20/2021 0224   HCT 24.0 (L) 02/17/2021 1146   PLT 356 03/20/2021 0224   PLT 376 02/17/2021 1146   MCV 86.1 03/20/2021 0224   MCV 85 02/17/2021 1146   MCH 28.2 03/20/2021 0224   MCHC 32.8  03/20/2021 0224   RDW 14.8 03/20/2021 0224   RDW 13.4 02/17/2021 1146   LYMPHSABS 2.7 03/20/2021 0224   MONOABS 1.4 (H) 03/20/2021 0224   EOSABS 0.2 03/20/2021 0224   BASOSABS 0.0 03/20/2021 0224   CMP Latest Ref Rng & Units 03/21/2021 03/20/2021 03/19/2021  Glucose 70 - 99 mg/dL 139(H) 113(H) 81  BUN 8 - 23 mg/dL 19 34(H) 62(H)  Creatinine 0.44 - 1.00 mg/dL 3.45(H) 3.94(H) 5.47(H)  Sodium 135 - 145 mmol/L 136 139 140  Potassium 3.5 - 5.1 mmol/L 3.8 3.9 4.2  Chloride 98 - 111 mmol/L 100 102 107  CO2 22 - 32 mmol/L 29 27 24   Calcium 8.9 - 10.3 mg/dL 8.2(L) 8.3(L) 8.1(L)  Total Protein 6.5 - 8.1 g/dL - - -  Total Bilirubin 0.3 - 1.2 mg/dL - - -  Alkaline Phos 38 - 126 U/L - - -  AST 15 - 41 U/L - - -  ALT 0 - 44 U/L - - -     Radiology Studies: No results found.   Scheduled Meds: . aspirin  81 mg Oral Daily  . calcitRIOL  0.25 mcg Oral Once per day on Mon Wed Fri  . carvedilol  3.125 mg Oral BID WC  . Chlorhexidine Gluconate Cloth  6 each Topical Daily  . darbepoetin (ARANESP) injection - NON-DIALYSIS  60 mcg Subcutaneous Q Sat-1800  . fluticasone  2 spray Each Nare Daily  . furosemide  80 mg Intravenous Once  . heparin  5,000 Units Subcutaneous Q8H  . hydrALAZINE  25 mg Oral Q8H  . insulin aspart  0-5 Units Subcutaneous QHS  . insulin aspart  0-9 Units Subcutaneous TID WC  . insulin detemir  6 Units Subcutaneous BID  . iron polysaccharides  150 mg Oral Daily  . mometasone-formoterol  2 puff Inhalation BID  . pneumococcal 23 valent vaccine  0.5 mL Intramuscular Tomorrow-1000  . rosuvastatin  10 mg Oral Daily  . sevelamer carbonate  800 mg Oral TID WC   Continuous Infusions: . sodium chloride       LOS: 12 days   Time spent: [place], MD [facility] To contact the attending provider between 7A-7P or the covering provider during after hours 7P-7A, please log into the web site [email or web address] and access using universal [place] password for  that web site. If you do not have the password, please call the hospital operator.  03/21/2021, 9:05 AM

## 2021-03-21 NOTE — Progress Notes (Signed)
[facility] Progress Note    Assessment/ Plan:   1. CKD with progression to ESRD:  s/p attempted AVF 5/24 which was aborted after acute onset of bradycardia and hypotension with EKG changes in the OR.  Per vascular could consider a first stage right basilic vein transposition under local anesthesia if cleared for same.  First HD on 5/26 after tunneled catheter with IR - Continue HD per MWF schedule   - She has an outpatient HD unit MWF as below  2. HFpEF exacerbation:  volume status improved.  Continue lasix intermittently and now on HD.  Lasix 80 mg IV once today.  Would plan to dose lasix 80 mg daily non HD days on discharge   3. Bradycardia - cardiology consulted and they have lowered her beta blocker.  No tele strips available from OR  4. CAD - per cardiology.  Note reported acute bradycardia with anesthesia  5. HTN:  - continue current regimen - optimize volume with HD   6. IDDM: - Continue management per primary team  7. Anemia CKD and iron deficiency   - s/p iv iron -  paused per patient request as had nausea and headache with the second dose. On iron PO. - got aranesp 60 mcg on 5/28 - CBC in am  8. Metabolic bone disease - continue renvela. Started HD. Continue calcitriol while here.  Note that on discharge calcitriol should be discontinued (as will be dosed at the outpatient HD unit)   Disposition - she has an outpatient dialysis unit MWF at [facility].  She would need to dialyze here on 5/30 then ok for discharge after that from a renal standpoint.       Subjective:   Last HD on 5/28 with 1.5 kg UF.  She had 200 ml uop over 5/28.  She states that HD went ok yesterday.  We discussed outpatient HD schedule and location again and she has no other questions.  I thanked her for letting us take care of her   Review of systems:   No n/v Denies shortness of breath or chest pain    Objective:   BP (!) 147/67 (BP Location: Left Arm)    Pulse 72   Temp 99.7 F (37.6 C) (Oral)   Resp 16   Ht 5\' 3"  (1.6 m)   Wt 90.9 kg   SpO2 99%   BMI 35.50 kg/m   Intake/Output Summary (Last 24 hours) at 03/21/2021 5573 Last data filed at 03/20/2021 2100 Gross per 24 hour  Intake 240 ml  Output 1700 ml  Net -1460 ml   Weight change: -3.1 kg  Physical Exam: General adult female in bed in no acute distress HEENT normocephalic atraumatic extraocular movements intact sclera anicteric Neck supple trachea midline Lungs clear to auscultation; normal work of breathing at rest; on room air Heart S1S2 no rub Abdomen soft nontender nondistended Extremities trace to 1+ edema bilateral lower extremities Psych normal mood and affect Neuro - alert and oriented x 3 provides hx and follows commands Access - RIJ tunneled catheter   Imaging: No results found.  Labs: BMET Recent Labs  Lab 03/15/21 0729 03/16/21 0332 03/17/21 0753 03/18/21 0322 03/19/21 0752 03/20/21 0224 03/21/21 0609  NA 140 140 137 138 140 139 136  K 4.0 4.6 4.5 4.6 4.2 3.9 3.8  CL 107 108 104 105 107 102 100  CO2 23 21* 19* 21* 24 27 29   GLUCOSE 106* 137* 184* 147* 81 113* 139*  BUN 77* 83* 89* 93* 62* 34* 19  CREATININE 5.90* 6.34* 6.84* 6.83* 5.47* 3.94* 3.45*  CALCIUM 7.9* 7.8* 8.7* 8.5* 8.1* 8.3* 8.2*  PHOS  --  7.0* 7.1* 7.5* 5.0* 4.1 3.4   CBC Recent Labs  Lab 03/16/21 1443 03/17/21 0753 03/18/21 0322 03/19/21 0752 03/20/21 0224  WBC 15.7* 15.1* 10.8* 10.2 8.6  NEUTROABS 13.7* 12.3* 7.0  --  4.2  HGB 7.5* 8.2* 7.6* 7.4* 7.9*  HCT 22.8* 24.7* 22.9* 22.3* 24.1*  MCV 86.7 86.1 85.4 85.4 86.1  PLT 407* 440* 396 365 356    Medications:    . aspirin  81 mg Oral Daily  . calcitRIOL  0.25 mcg Oral Once per day on Mon Wed Fri  . carvedilol  3.125 mg Oral BID WC  . Chlorhexidine Gluconate Cloth  6 each Topical Daily  . darbepoetin (ARANESP) injection - NON-DIALYSIS  60 mcg Subcutaneous Q Sat-1800  . fluticasone  2 spray Each Nare Daily  .  furosemide  80 mg Intravenous Once  . heparin  5,000 Units Subcutaneous Q8H  . hydrALAZINE  25 mg Oral Q8H  . insulin aspart  0-5 Units Subcutaneous QHS  . insulin aspart  0-9 Units Subcutaneous TID WC  . insulin detemir  6 Units Subcutaneous BID  . iron polysaccharides  150 mg Oral Daily  . mometasone-formoterol  2 puff Inhalation BID  . pneumococcal 23 valent vaccine  0.5 mL Intramuscular Tomorrow-1000  . rosuvastatin  10 mg Oral Daily  . sevelamer carbonate  800 mg Oral TID WC    Claudia Desanctis, MD  03/21/2021  8:44 AM

## 2021-03-22 DIAGNOSIS — I5033 Acute on chronic diastolic (congestive) heart failure: Secondary | ICD-10-CM | POA: Diagnosis not present

## 2021-03-22 LAB — GLUCOSE, CAPILLARY
Glucose-Capillary: 101 mg/dL — ABNORMAL HIGH (ref 70–99)
Glucose-Capillary: 113 mg/dL — ABNORMAL HIGH (ref 70–99)
Glucose-Capillary: 121 mg/dL — ABNORMAL HIGH (ref 70–99)
Glucose-Capillary: 125 mg/dL — ABNORMAL HIGH (ref 70–99)
Glucose-Capillary: 197 mg/dL — ABNORMAL HIGH (ref 70–99)
Glucose-Capillary: 238 mg/dL — ABNORMAL HIGH (ref 70–99)

## 2021-03-22 LAB — CBC WITH DIFFERENTIAL/PLATELET
Abs Immature Granulocytes: 0.09 10*3/uL — ABNORMAL HIGH (ref 0.00–0.07)
Basophils Absolute: 0.1 10*3/uL (ref 0.0–0.1)
Basophils Relative: 1 %
Eosinophils Absolute: 0.2 10*3/uL (ref 0.0–0.5)
Eosinophils Relative: 2 %
HCT: 24.9 % — ABNORMAL LOW (ref 36.0–46.0)
Hemoglobin: 8.1 g/dL — ABNORMAL LOW (ref 12.0–15.0)
Immature Granulocytes: 1 %
Lymphocytes Relative: 34 %
Lymphs Abs: 4.2 10*3/uL — ABNORMAL HIGH (ref 0.7–4.0)
MCH: 28.4 pg (ref 26.0–34.0)
MCHC: 32.5 g/dL (ref 30.0–36.0)
MCV: 87.4 fL (ref 80.0–100.0)
Monocytes Absolute: 1.7 10*3/uL — ABNORMAL HIGH (ref 0.1–1.0)
Monocytes Relative: 14 %
Neutro Abs: 6.1 10*3/uL (ref 1.7–7.7)
Neutrophils Relative %: 48 %
Platelets: 331 10*3/uL (ref 150–400)
RBC: 2.85 MIL/uL — ABNORMAL LOW (ref 3.87–5.11)
RDW: 14.6 % (ref 11.5–15.5)
WBC: 12.2 10*3/uL — ABNORMAL HIGH (ref 4.0–10.5)
nRBC: 0 % (ref 0.0–0.2)

## 2021-03-22 LAB — RENAL FUNCTION PANEL
Albumin: 2.8 g/dL — ABNORMAL LOW (ref 3.5–5.0)
Anion gap: 7 (ref 5–15)
BUN: 25 mg/dL — ABNORMAL HIGH (ref 8–23)
CO2: 28 mmol/L (ref 22–32)
Calcium: 8.7 mg/dL — ABNORMAL LOW (ref 8.9–10.3)
Chloride: 102 mmol/L (ref 98–111)
Creatinine, Ser: 4.48 mg/dL — ABNORMAL HIGH (ref 0.44–1.00)
GFR, Estimated: 10 mL/min — ABNORMAL LOW (ref >60)
Glucose, Bld: 106 mg/dL — ABNORMAL HIGH (ref 70–99)
Phosphorus: 3.8 mg/dL (ref 2.5–4.6)
Potassium: 3.7 mmol/L (ref 3.5–5.1)
Sodium: 137 mmol/L (ref 135–145)

## 2021-03-22 MED ORDER — DOCUSATE SODIUM 100 MG PO CAPS
200.0000 mg | ORAL_CAPSULE | Freq: Every day | ORAL | Status: DC | PRN
  Administered 2021-03-22: 200 mg via ORAL
  Filled 2021-03-22: qty 2

## 2021-03-22 NOTE — Progress Notes (Signed)
PROGRESS NOTE   Carolyn Russo  FIE:[id number] DOB: Apr 16, 1951 DOA: 03/09/2021 PCP: [name], MD  Brief Narrative:  57 community dwelling white female CKD 4 follows Dr. Cherlyn Cushing baseline creatinine 4.09--previously has refused dialysis-referred to [facility] for kidney transplant eval IDDM IDA HTN Asthma CAD-cardiac cath 61218---30% LAD stenosis  Developed SOB 03/09/2021-CXR pleural effusions-Rx Lasix 60 twice daily and was admitted Became bradycardic hypotensive on attempt at right basilic vein exposure 1/66 Cardiology reconsulted and have since s/o  Eventually [facility] placed 5/26 Started HD successfully Being set-up for OP HD  Developed 5/29 hypoxia requiring oxygen-episodic aspiration 5/30 cleared by speech therapy for regular diet-x-rays suggested possible pneumonia however Following for stability in the next several days and likely discharge for further dialysis in the outpatient setting if stabilized with  Hospital-Problem based course  Perioperative bradycardia hypotension 5/24 while attempting Basilic vn exposure Received calcium adrenaline atropine-resolved--Cardiology S/O---5/26 appreciate input Continue Coreg 3.125 Declared end-stage renal disease-has outpatient spot MWF Dialysis initiated this admit-TDC right chest placed 5/26-pain resolved form TDC placement c tramadol IHD initiated 5/26-rest [esa/binders] as per nephrology Lasix 80 TTS S per nephrologist Hypoxia-expected and probably from volume overload Aspiration episode 03/21/2021 Events noted overnight 5/29 Mild hypoxia noted still therefore x-ray was performed 5/30 and patient does have risk for pneumonia/WBC up-Unasyn started We will continue Unasyn today and reevaluate how she does Repeat x-ray tomorrow and transition postdialysis based on that to Augmentin Could possibly discharge home tomorrow on oral antibiotics Anemia of likely renal disease Iron 43 sat ratios 14-IV iron caused nausea Started Auryxia  5/25 continue-recheck iron levels in 3 weeks HFpEF Cardiac cath 6/18--40% LAD stenosis no intervention at that time Hydralazine 25 every 8, resumed Coreg 3.125 on 5/25 As needed hydralazine IV if above 180 D/c Hyzaar Insulin-dependent DM TY 2 A1c 6.2 CBG 100-197 Cut back Levemir to 6 sliding scale 70/30 insulin on hold   DVT prophylaxis: Heparin Code Status: Full Family Communication: discussed with patient's husband in detail Dr. [name] who was present today Disposition:  Status is: Inpatient  Remains inpatient appropriate because:Hemodynamically unstable, Persistent severe electrolyte disturbances and Ongoing diagnostic testing needed not appropriate for outpatient work up   Dispo: The patient is from: Home              Anticipated d/c is to: Home expect will be here at least through the weekend clip process etc. needs to be performed              Patient currently is not medically stable to d/c.   Difficult to place patient No  Consultants:   Cardiology, nephrology  Procedures: none  Antimicrobials: no    Subjective:  Aspiration event noted on very sour collards that she had- note speech therapy's input who are recommending regular thin liquid diet Overall she wants to go home-we had a long discussion in the presence of her husband Dr. [name] about ensuring she is stable and we will check labs a.m.   Objective: Vitals:   03/22/21 1054 03/22/21 1320 03/22/21 1325 03/22/21 1330  BP: (!) 132/58 (!) 173/82 (!) 180/92 (!) 181/78  Pulse: 75 81 78 80  Resp: 18 20 16 18   Temp: 98.7 F (37.1 C) 98.4 F (36.9 C)    TempSrc: Oral Oral    SpO2: 100% 100%    Weight:  92.2 kg    Height:        Intake/Output Summary (Last 24 hours) at 03/22/2021 1343 Last data filed at 03/22/2021 0630 Gross  per 24 hour  Intake --  Output 750 ml  Net -750 ml   Filed Weights   03/21/21 0331 03/22/21 0155 03/22/21 1320  Weight: 90.9 kg 90.5 kg 92.2 kg     Examination:  Pleasant slightly hard of hearing Coherent on oxygen Chest shows crackles posterolaterally Abdomen obese nontender Trace lower extremity edema  Data Reviewed: personally reviewed   CBC    Component Value Date/Time   WBC 12.2 (H) 03/22/2021 0211   RBC 2.85 (L) 03/22/2021 0211   HGB 8.1 (L) 03/22/2021 0211   HGB 8.0 (L) 02/17/2021 1146   HCT 24.9 (L) 03/22/2021 0211   HCT 24.0 (L) 02/17/2021 1146   PLT 331 03/22/2021 0211   PLT 376 02/17/2021 1146   MCV 87.4 03/22/2021 0211   MCV 85 02/17/2021 1146   MCH 28.4 03/22/2021 0211   MCHC 32.5 03/22/2021 0211   RDW 14.6 03/22/2021 0211   RDW 13.4 02/17/2021 1146   LYMPHSABS 4.2 (H) 03/22/2021 0211   MONOABS 1.7 (H) 03/22/2021 0211   EOSABS 0.2 03/22/2021 0211   BASOSABS 0.1 03/22/2021 0211   CMP Latest Ref Rng & Units 03/22/2021 03/21/2021 03/20/2021  Glucose 70 - 99 mg/dL 106(H) 139(H) 113(H)  BUN 8 - 23 mg/dL 25(H) 19 34(H)  Creatinine 0.44 - 1.00 mg/dL 4.48(H) 3.45(H) 3.94(H)  Sodium 135 - 145 mmol/L 137 136 139  Potassium 3.5 - 5.1 mmol/L 3.7 3.8 3.9  Chloride 98 - 111 mmol/L 102 100 102  CO2 22 - 32 mmol/L 28 29 27   Calcium 8.9 - 10.3 mg/dL 8.7(L) 8.2(L) 8.3(L)  Total Protein 6.5 - 8.1 g/dL - - -  Total Bilirubin 0.3 - 1.2 mg/dL - - -  Alkaline Phos 38 - 126 U/L - - -  AST 15 - 41 U/L - - -  ALT 0 - 44 U/L - - -     Radiology Studies: DG CHEST PORT 1 VIEW  Result Date: 03/21/2021 CLINICAL DATA:  Recent aspiration EXAM: PORTABLE CHEST 1 VIEW COMPARISON:  03/10/2021 FINDINGS: Cardiac shadow is enlarged but stable. New right jugular dialysis catheter is noted in satisfactory position. Right lung is clear. Left lung demonstrates some mild basilar opacity increased when compared with the prior exam. No bony abnormality is noted. IMPRESSION: Increasing left basilar airspace opacity consistent with focal infiltrate. Electronically Signed   By: Inez Catalina M.D.   On: 03/21/2021 20:22     Scheduled  Meds: . aspirin  81 mg Oral Daily  . calcitRIOL  0.25 mcg Oral Once per day on Mon Wed Fri  . carvedilol  3.125 mg Oral BID WC  . Chlorhexidine Gluconate Cloth  6 each Topical Daily  . darbepoetin (ARANESP) injection - NON-DIALYSIS  60 mcg Subcutaneous Q Sat-1800  . fluticasone  2 spray Each Nare Daily  . furosemide  80 mg Intravenous Once  . heparin  5,000 Units Subcutaneous Q8H  . hydrALAZINE  25 mg Oral Q8H  . insulin aspart  0-5 Units Subcutaneous QHS  . insulin aspart  0-9 Units Subcutaneous TID WC  . insulin detemir  6 Units Subcutaneous BID  . iron polysaccharides  150 mg Oral Daily  . mometasone-formoterol  2 puff Inhalation BID  . pneumococcal 23 valent vaccine  0.5 mL Intramuscular Tomorrow-1000  . rosuvastatin  10 mg Oral Daily  . sevelamer carbonate  800 mg Oral TID WC   Continuous Infusions: . sodium chloride    . ampicillin-sulbactam (UNASYN) IV       LOS:  13 days   Time spent: [place], MD [facility] To contact the attending provider between 7A-7P or the covering provider during after hours 7P-7A, please log into the web site [email or web address] and access using universal [place] password for that web site. If you do not have the password, please call the hospital operator.  03/22/2021, 1:43 PM

## 2021-03-22 NOTE — Evaluation (Signed)
Clinical/Bedside Swallow Evaluation Patient Details  Name: Carolyn Russo MRN: [id number] Date of Birth: April 22, 1951  Today's Date: 03/22/2021 Time: SLP Start Time (ACUTE ONLY): 0254 SLP Stop Time (ACUTE ONLY): 1008 SLP Time Calculation (min) (ACUTE ONLY): 26 min  Past Medical History:  Past Medical History:  Diagnosis Date  . Allergy   . Anterior chest wall pain   . Appendicitis 1965  . Asthma   . Body mass index 37.0-37.9, adult   . Breast pain   . Cataract    both eyes  . Chronic renal disease, stage II   . Dehydration 2014  . Deviated septum 1971  . Diabetes mellitus   . Dyspnea 2014  . Extrinsic asthma    WITH ASTHMA ATTACK  . Fibroid 1980  . GERD (gastroesophageal reflux disease)   . Heart murmur   . Hx gestational diabetes   . Hyperlipidemia   . Hypertension 2014  . Inguinal hernia 1959  . Malaise and fatigue 2014  . Nephropathy   . Non-IgE mediated allergic asthma 2014  . Obesity   . Pelvic pain   . Pregnancy, high-risk 1985  . Tonsillitis 1968  . Uterine fibroid 1980   Past Surgical History:  Past Surgical History:  Procedure Laterality Date  . APPENDECTOMY  1959  . CESAREAN SECTION  1985  . COLONOSCOPY    . EYE SURGERY     bilateral cataract   . HERNIA REPAIR  1959  . IR FLUORO GUIDE CV LINE RIGHT  03/18/2021  . IR US GUIDE VASC ACCESS RIGHT  03/18/2021  . LEFT HEART CATH AND CORONARY ANGIOGRAPHY N/A 04/04/2017   Procedure: Left Heart Cath and Coronary Angiography;  Surgeon: Belva Crome, MD;  Location: [facility];  Service: Cardiovascular;  Laterality: N/A;  . [place], 2004, 2007  . RHINOPLASTY  1971  . ROTATOR CUFF REPAIR  2003  . SURGICAL REPAIR OF HEMORRHAGE  2015  . TONSILLECTOMY  1968   HPI:  Pt is a 70 y/o female admitted secondary to SOB and bilateral LE edema, found to have acute on chronic diastolic CHF. Pt is now s/p first stage right basilic vein transposition on 5/24 and tunneled HD catheter placement on 5/26.  Swallowing eval was ordered after concern for choking event on the evening of 5/29 involving collards with vinegar. CXR that evening revealed increasing L basilar airspace opacity consistent with focal infiltrate. PMH including but not limited to GERD, CKD stage IV or V, insulin-dependent diabetes mellitus, CAD, iron deficiency anemia, hypertension, and asthma.   Assessment / Plan / Recommendation Clinical Impression  Pt's oropharyngeal swallowing appears to be grossly functional during clinical observation, and she denies any h/o dysphagia. She does take extra liquid washes and appears to swallow with milldy increased effort when consuming dry solids, but she describes these as chronic compensations. This could potentially be more esophageal given her h/o GERD. Pt and spouse both describe the collards last night as being very strongly acidic from the vinegar. Suspect that her difficulty with them was more episodic in nature as opposed to being related to a more persistent dysphagia. Recommend continuing regular solids and thin liquids, with education provided about aspiration precautions. Will f/u briefly if she remains inpatient, but further f/u after discharge is likely not indicated.  SLP Visit Diagnosis: Dysphagia, unspecified (R13.10)    Aspiration Risk  Mild aspiration risk    Diet Recommendation Regular;Thin liquid   Liquid Administration via: Cup;Straw Medication Administration: Whole meds with  liquid Supervision: Patient able to self feed;Intermittent supervision to cue for compensatory strategies Compensations: Slow rate;Small sips/bites Postural Changes: Remain upright for at least 30 minutes after po intake;Seated upright at 90 degrees    Other  Recommendations Oral Care Recommendations: Oral care BID   Follow up Recommendations None      Frequency and Duration min 1 x/week  1 week       Prognosis Prognosis for Safe Diet Advancement: Good      Swallow Study   General  HPI: Pt is a 70 y/o female admitted secondary to SOB and bilateral LE edema, found to have acute on chronic diastolic CHF. Pt is now s/p first stage right basilic vein transposition on 5/24 and tunneled HD catheter placement on 5/26. Swallowing eval was ordered after concern for choking event on the evening of 5/29 involving collards with vinegar. CXR that evening revealed increasing L basilar airspace opacity consistent with focal infiltrate. PMH including but not limited to GERD, CKD stage IV or V, insulin-dependent diabetes mellitus, CAD, iron deficiency anemia, hypertension, and asthma. Type of Study: Bedside Swallow Evaluation Previous Swallow Assessment: none in chart Diet Prior to this Study: Regular;Thin liquids (but POs being held per RN) Temperature Spikes Noted: No Respiratory Status: Nasal cannula History of Recent Intubation: No Behavior/Cognition: Alert;Cooperative;Pleasant mood Oral Cavity Assessment: Within Functional Limits Oral Care Completed by SLP: No Oral Cavity - Dentition: Adequate natural dentition Vision: Functional for self-feeding Self-Feeding Abilities: Able to feed self Patient Positioning: Upright in bed Baseline Vocal Quality: Normal Volitional Cough: Strong Volitional Swallow: Able to elicit    Oral/Motor/Sensory Function Overall Oral Motor/Sensory Function: Within functional limits   Ice Chips Ice chips: Not tested   Thin Liquid Thin Liquid: Within functional limits Presentation: Cup;Self Fed;Straw    Nectar Thick Nectar Thick Liquid: Not tested   Honey Thick Honey Thick Liquid: Not tested   Puree Puree: Within functional limits Presentation: Self Fed;Spoon   Solid     Solid: Within functional limits Presentation: Self Fed       Osie Bond., M.A. [facility] Pager [phone] Office [phone]  03/22/2021,10:44 AM

## 2021-03-22 NOTE — Progress Notes (Signed)
Patient ID: Carolyn Russo, female   DOB: 1951/07/01, 70 y.o.   MRN: [id number] [facility] Progress Note   Assessment/ Plan:   1.  End-stage renal disease: Following progressive chronic kidney disease with attempts to undertake AV fistula placement on 5/24 that was aborted after she developed bradycardia/hypotension.  Dialysis access will be pursued as an outpatient.  Continue dialysis via [facility] on MWF schedule with possibility to discharge home later today if clinically stable (OP HD @[facility]  MWF). 2.  Acute exacerbation of diastolic heart failure: With improving volume status with hemodialysis and status post furosemide. 3.  Hypertension: Continue efforts at ultrafiltration for additional optimization of blood pressure control while on low-dose carvedilol (limited by bradycardia) and hydralazine. 4.  Anemia of chronic kidney disease: Without overt blood loss, continue Aranesp at this time for management of anemia.  Low iron saturation with previous reaction to intravenous iron. 5.  Secondary hyperparathyroidism: Continue calcitriol for PTH suppression and Renvela for phosphorus binding.  Subjective:   Reports to be feeling fair, still requiring oxygen supplementation but denies shortness of breath at rest.  Had an episode of choking last night and awaiting swallow eval this morning.   Objective:   BP (!) 158/72 (BP Location: Left Arm)   Pulse 79   Temp 99.2 F (37.3 C) (Oral)   Resp 17   Ht 5\' 3"  (1.6 m)   Wt 90.5 kg Comment: scale c  SpO2 99%   BMI 35.36 kg/m   Intake/Output Summary (Last 24 hours) at 03/22/2021 0841 Last data filed at 03/22/2021 0552 Gross per 24 hour  Intake 240 ml  Output 750 ml  Net -510 ml   Weight change: -2.162 kg  Physical Exam: Gen: Appears comfortable resting in bed, husband at bedside CVS: Pulse regular rhythm, normal rate, S1 and S2 normal Resp: Anteriorly clear to auscultation, no rales/rhonchi.  Right IJ TDC Abd: Soft, obese, nontender,  bowel sounds normal Ext: Trace-1+ right lower extremity edema, trace left lower extremity edema  Imaging: DG CHEST PORT 1 VIEW  Result Date: 03/21/2021 CLINICAL DATA:  Recent aspiration EXAM: PORTABLE CHEST 1 VIEW COMPARISON:  03/10/2021 FINDINGS: Cardiac shadow is enlarged but stable. New right jugular dialysis catheter is noted in satisfactory position. Right lung is clear. Left lung demonstrates some mild basilar opacity increased when compared with the prior exam. No bony abnormality is noted. IMPRESSION: Increasing left basilar airspace opacity consistent with focal infiltrate. Electronically Signed   By: Inez Catalina M.D.   On: 03/21/2021 20:22    Labs: BMET Recent Labs  Lab 03/16/21 0332 03/17/21 0753 03/18/21 0322 03/19/21 0752 03/20/21 0224 03/21/21 0609 03/22/21 0211  NA 140 137 138 140 139 136 137  K 4.6 4.5 4.6 4.2 3.9 3.8 3.7  CL 108 104 105 107 102 100 102  CO2 21* 19* 21* 24 27 29 28   GLUCOSE 137* 184* 147* 81 113* 139* 106*  BUN 83* 89* 93* 62* 34* 19 25*  CREATININE 6.34* 6.84* 6.83* 5.47* 3.94* 3.45* 4.48*  CALCIUM 7.8* 8.7* 8.5* 8.1* 8.3* 8.2* 8.7*  PHOS 7.0* 7.1* 7.5* 5.0* 4.1 3.4 3.8   CBC Recent Labs  Lab 03/17/21 0753 03/18/21 0322 03/19/21 0752 03/20/21 0224 03/22/21 0211  WBC 15.1* 10.8* 10.2 8.6 12.2*  NEUTROABS 12.3* 7.0  --  4.2 6.1  HGB 8.2* 7.6* 7.4* 7.9* 8.1*  HCT 24.7* 22.9* 22.3* 24.1* 24.9*  MCV 86.1 85.4 85.4 86.1 87.4  PLT 440* 396 365 356 331  Medications:    . aspirin  81 mg Oral Daily  . calcitRIOL  0.25 mcg Oral Once per day on Mon Wed Fri  . carvedilol  3.125 mg Oral BID WC  . Chlorhexidine Gluconate Cloth  6 each Topical Daily  . darbepoetin (ARANESP) injection - NON-DIALYSIS  60 mcg Subcutaneous Q Sat-1800  . fluticasone  2 spray Each Nare Daily  . furosemide  80 mg Intravenous Once  . heparin  5,000 Units Subcutaneous Q8H  . hydrALAZINE  25 mg Oral Q8H  . insulin aspart  0-5 Units Subcutaneous QHS  . insulin  aspart  0-9 Units Subcutaneous TID WC  . insulin detemir  6 Units Subcutaneous BID  . iron polysaccharides  150 mg Oral Daily  . mometasone-formoterol  2 puff Inhalation BID  . pneumococcal 23 valent vaccine  0.5 mL Intramuscular Tomorrow-1000  . rosuvastatin  10 mg Oral Daily  . sevelamer carbonate  800 mg Oral TID WC   Elmarie Shiley, MD 03/22/2021, 8:41 AM

## 2021-03-22 NOTE — Plan of Care (Signed)
Nutrition Education Note  RD consulted for Renal Education. Per pt she has met with an RD and had already started making changes to her diet.  Reviewed food groups and provided written recommended serving sizes specifically determined for patient's current nutritional status.   Explained why diet restrictions are needed and provided lists of foods to limit/avoid that are high potassium, sodium, and phosphorus. Provided specific recommendations on safer alternatives of these foods. Strongly encouraged compliance of this diet.   Discussed importance of protein intake at each meal and snack. Provided examples of how to maximize protein intake throughout the day. Discussed need for fluid restriction with dialysis, importance of minimizing weight gain between HD treatments, and renal-friendly beverage options.  Encouraged pt to discuss specific diet questions/concerns with RD at HD outpatient facility. Teach back method used.  Expect fair compliance.  Body mass index is 36.01 kg/m. Pt meets criteria for obesity based on current BMI.  Current diet order is Renal/CHO modified, patient is consuming approximately 100% of meals at this time. Labs and medications reviewed. No further nutrition interventions warranted at this time. RD contact information provided. If additional nutrition issues arise, please re-consult RD.  Lockie Pares., RD, LDN, CNSC See AMiON for contact information

## 2021-03-22 NOTE — Progress Notes (Signed)
Patient and husband had concerns about her going to dialysis having not had anything to eat yesterday early evening. She is NPO until she can be elevated by speech therapy.

## 2021-03-22 NOTE — Care Management Important Message (Signed)
Important Message  Patient Details  Name: Carolyn Russo MRN: [id number] Date of Birth: Jan 26, 1951   Medicare Important Message Given:  Yes     [name] 03/22/2021, 8:40 AM

## 2021-03-22 NOTE — Plan of Care (Signed)
°  Problem: Clinical Measurements: °Goal: Cardiovascular complication will be avoided °Outcome: Progressing °  °Problem: Activity: °Goal: Risk for activity intolerance will decrease °Outcome: Progressing °  °

## 2021-03-22 NOTE — Plan of Care (Signed)
  Problem: Clinical Measurements: Goal: Ability to maintain clinical measurements within normal limits will improve Outcome: Progressing Goal: Will remain free from infection Outcome: Progressing Goal: Diagnostic test results will improve Outcome: Progressing Goal: Respiratory complications will improve Outcome: Progressing Goal: Cardiovascular complication will be avoided Outcome: Progressing   Problem: Nutrition: Goal: Adequate nutrition will be maintained Outcome: Progressing   Problem: Activity: Goal: Risk for activity intolerance will decrease Outcome: Progressing   

## 2021-03-23 ENCOUNTER — Telehealth: Payer: Self-pay

## 2021-03-23 ENCOUNTER — Inpatient Hospital Stay (HOSPITAL_COMMUNITY): Payer: Medicare Other

## 2021-03-23 LAB — CBC WITH DIFFERENTIAL/PLATELET
Abs Immature Granulocytes: 0.06 10*3/uL (ref 0.00–0.07)
Basophils Absolute: 0.1 10*3/uL (ref 0.0–0.1)
Basophils Relative: 0 %
Eosinophils Absolute: 0.2 10*3/uL (ref 0.0–0.5)
Eosinophils Relative: 2 %
HCT: 25 % — ABNORMAL LOW (ref 36.0–46.0)
Hemoglobin: 8.1 g/dL — ABNORMAL LOW (ref 12.0–15.0)
Immature Granulocytes: 1 %
Lymphocytes Relative: 33 %
Lymphs Abs: 3.8 10*3/uL (ref 0.7–4.0)
MCH: 28.4 pg (ref 26.0–34.0)
MCHC: 32.4 g/dL (ref 30.0–36.0)
MCV: 87.7 fL (ref 80.0–100.0)
Monocytes Absolute: 1.8 10*3/uL — ABNORMAL HIGH (ref 0.1–1.0)
Monocytes Relative: 15 %
Neutro Abs: 5.9 10*3/uL (ref 1.7–7.7)
Neutrophils Relative %: 49 %
Platelets: 298 10*3/uL (ref 150–400)
RBC: 2.85 MIL/uL — ABNORMAL LOW (ref 3.87–5.11)
RDW: 14.7 % (ref 11.5–15.5)
WBC: 11.8 10*3/uL — ABNORMAL HIGH (ref 4.0–10.5)
nRBC: 0.2 % (ref 0.0–0.2)

## 2021-03-23 LAB — RENAL FUNCTION PANEL
Albumin: 2.7 g/dL — ABNORMAL LOW (ref 3.5–5.0)
Anion gap: 9 (ref 5–15)
BUN: 13 mg/dL (ref 8–23)
CO2: 29 mmol/L (ref 22–32)
Calcium: 8.4 mg/dL — ABNORMAL LOW (ref 8.9–10.3)
Chloride: 100 mmol/L (ref 98–111)
Creatinine, Ser: 3.13 mg/dL — ABNORMAL HIGH (ref 0.44–1.00)
GFR, Estimated: 15 mL/min — ABNORMAL LOW (ref >60)
Glucose, Bld: 155 mg/dL — ABNORMAL HIGH (ref 70–99)
Phosphorus: 3 mg/dL (ref 2.5–4.6)
Potassium: 4.1 mmol/L (ref 3.5–5.1)
Sodium: 138 mmol/L (ref 135–145)

## 2021-03-23 LAB — GLUCOSE, CAPILLARY
Glucose-Capillary: 129 mg/dL — ABNORMAL HIGH (ref 70–99)
Glucose-Capillary: 201 mg/dL — ABNORMAL HIGH (ref 70–99)
Glucose-Capillary: 219 mg/dL — ABNORMAL HIGH (ref 70–99)
Glucose-Capillary: 164 mg/dL — ABNORMAL HIGH (ref 70–99)

## 2021-03-23 IMAGING — CR DG CHEST 2V
2 series · 2 of 2 positions shown · non-contrast
Comparison: [DATE]

CLINICAL DATA: Pneumonia, asthma

EXAM:
CHEST - 2 VIEW

[chest lat]
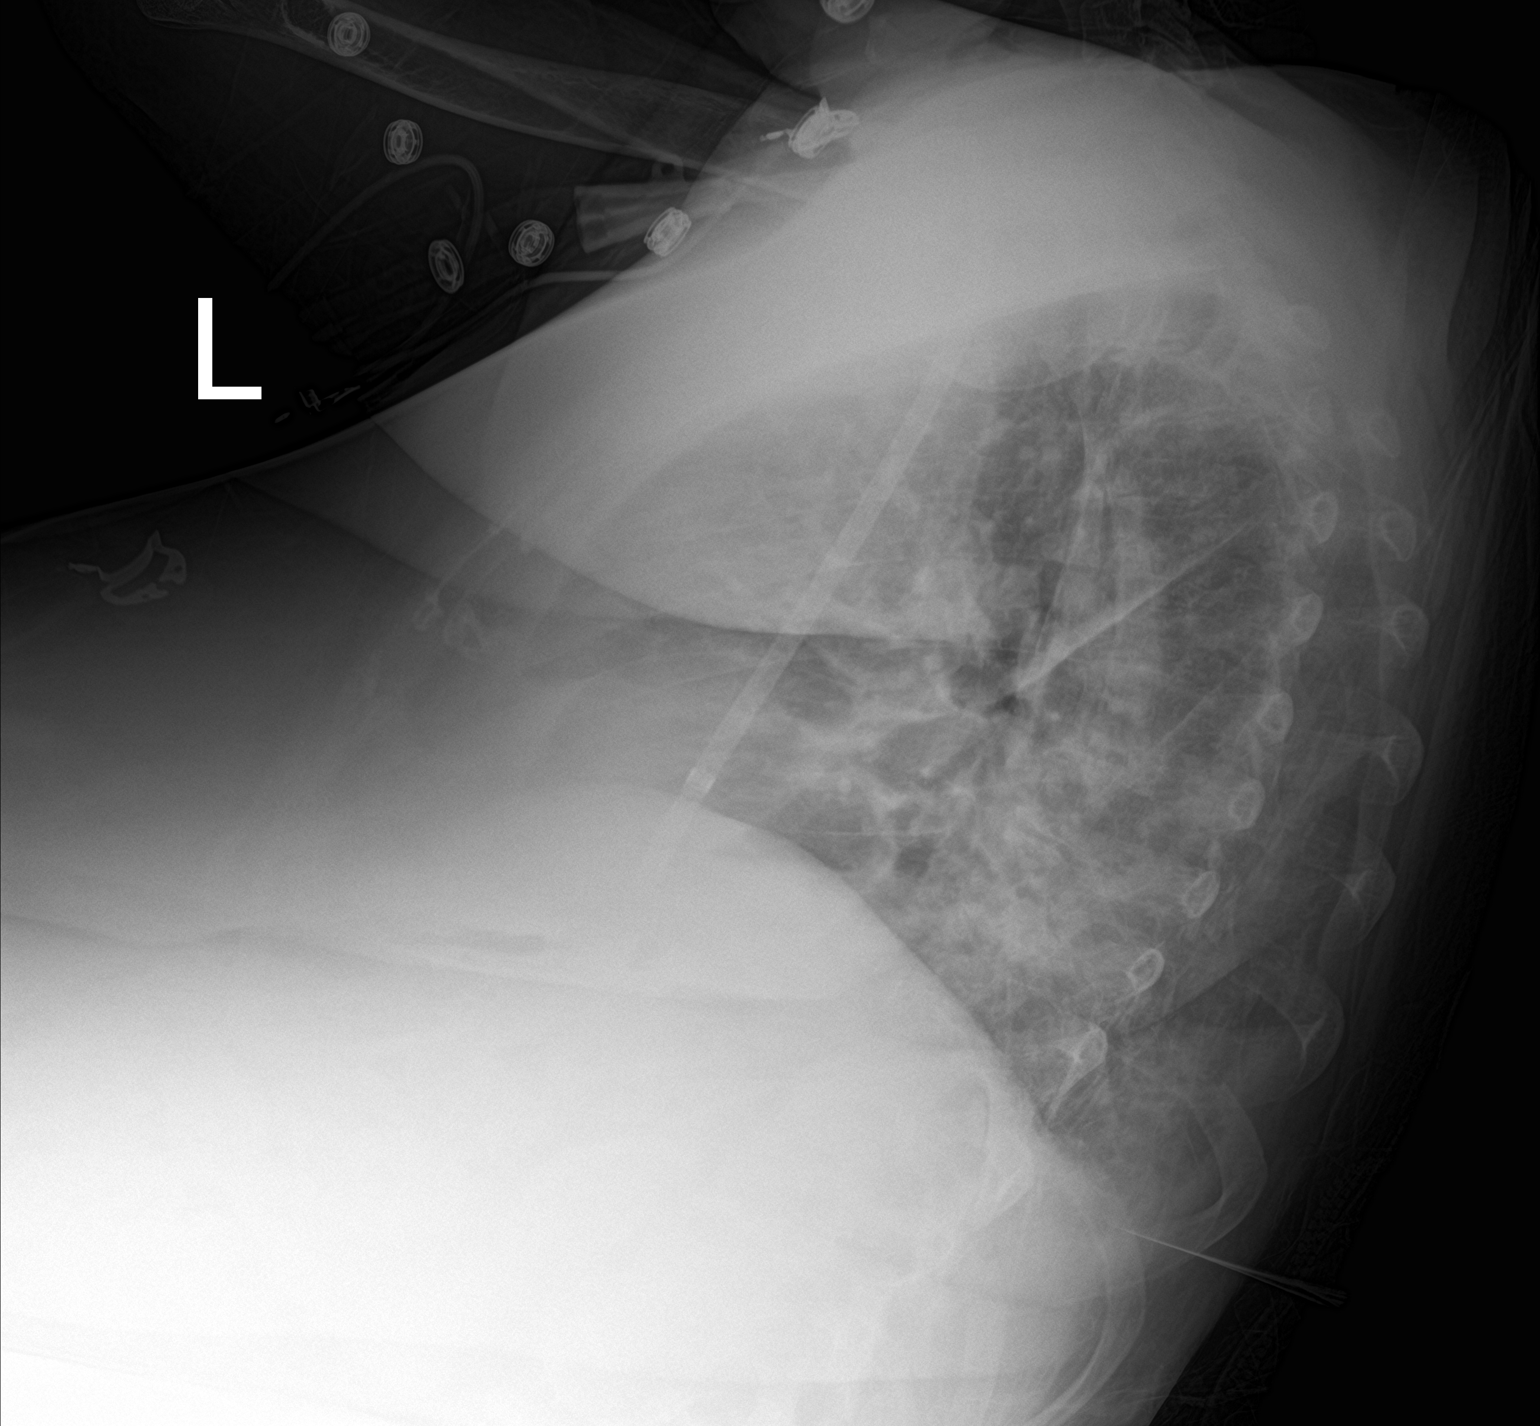

[chest ap]
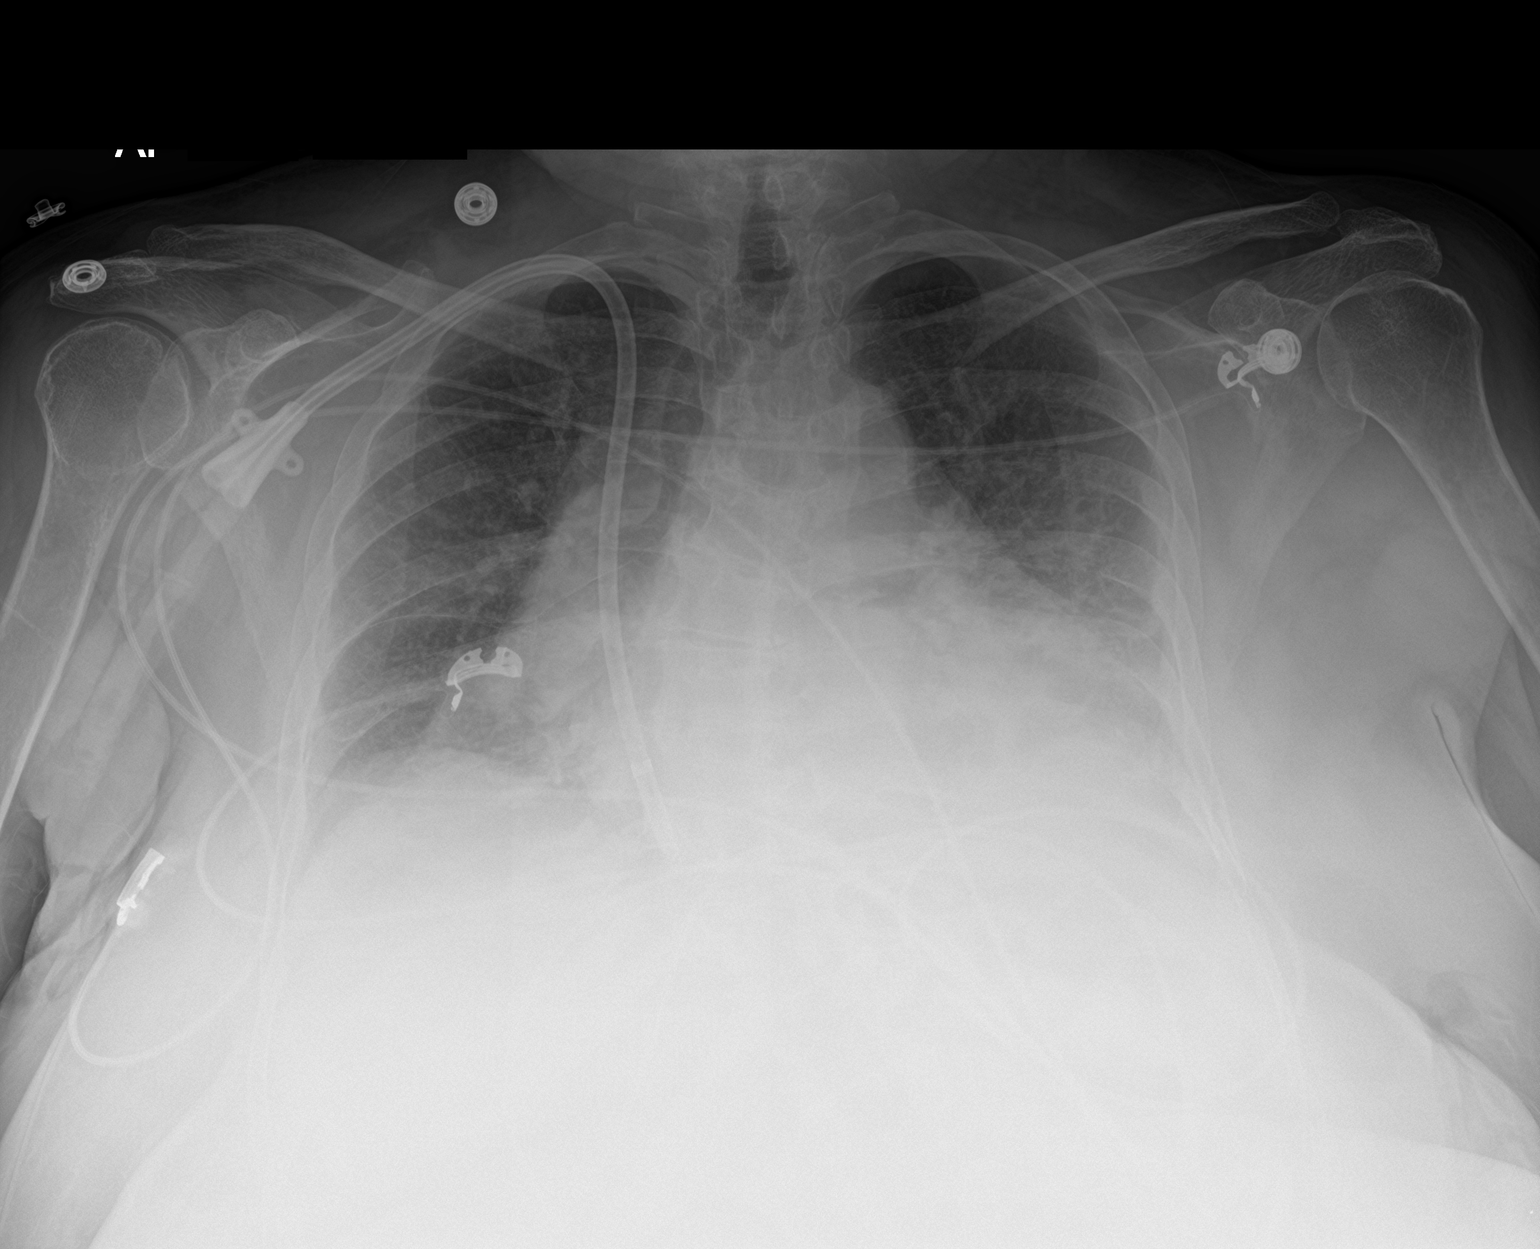

[2 of 2 positions shown; findings below may reference images not displayed]

FINDINGS: Right IJ approach hemodialysis catheter terminates at the level of
the right atrium. Stable cardiomegaly. Low lung volumes. Patchy left
basilar opacity, not appreciably changed from prior. No large
pleural fluid collection. No pneumothorax.
IMPRESSION: Patchy left basilar opacity, not appreciably changed from prior.

## 2021-03-23 MED ORDER — AMOXICILLIN-POT CLAVULANATE 500-125 MG PO TABS: 1.0000 | ORAL_TABLET | Freq: Every day | ORAL | 0 refills | Status: DC

## 2021-03-23 MED ORDER — FUROSEMIDE 40 MG PO TABS: 40.0000 mg | ORAL_TABLET | ORAL | Status: DC

## 2021-03-23 MED ORDER — NOVOLOG MIX 70/30 FLEXPEN (70-30) 100 UNIT/ML ~~LOC~~ SUPN: 100.0000 [IU] | PEN_INJECTOR | Freq: Two times a day (BID) | SUBCUTANEOUS | Status: DC

## 2021-03-23 MED ORDER — POLYSACCHARIDE IRON COMPLEX 150 MG PO CAPS: 150.0000 mg | ORAL_CAPSULE | Freq: Every day | ORAL | 12 refills | Status: DC

## 2021-03-23 MED ORDER — TRAMADOL HCL 50 MG PO TABS: 50.0000 mg | ORAL_TABLET | Freq: Two times a day (BID) | ORAL | 0 refills | Status: DC | PRN

## 2021-03-23 MED ORDER — FUROSEMIDE 80 MG PO TABS: ORAL_TABLET | ORAL | 11 refills | Status: DC

## 2021-03-23 MED ORDER — AMOXICILLIN-POT CLAVULANATE 500-125 MG PO TABS
1.0000 | ORAL_TABLET | Freq: Every day | ORAL | Status: DC
  Administered 2021-03-23: 500 mg via ORAL
  Filled 2021-03-23 (×2): qty 1

## 2021-03-23 MED ORDER — SEVELAMER CARBONATE 800 MG PO TABS: 800.0000 mg | ORAL_TABLET | Freq: Three times a day (TID) | ORAL | 0 refills | Status: DC

## 2021-03-23 MED ORDER — CARVEDILOL 3.125 MG PO TABS: 3.1250 mg | ORAL_TABLET | Freq: Two times a day (BID) | ORAL | 0 refills | Status: DC

## 2021-03-23 NOTE — Progress Notes (Signed)
Physician Discharge Summary  TAHJ LINDSETH KYH:[id number] DOB: [birth year] DOA: 03/09/2021  PCP: [name], MD  Admit date: 03/09/2021 Discharge date: 03/23/2021  Time spent: 46 minutes  Recommendations for Outpatient Follow-up:  Complete Augmentin on 03/27/2021 for presumed aspiration pneumonia Recommend renal panel, and CBC 1 week Recommend iron levels in about 3 weeks with consideration for iron infusion May require oxygen based on desat screen to be repeated prior to discharge   Discharge Diagnoses:  MAIN problem for hospitalization   Progression of CKD 5 to ESRD Probable aspiration pneumonia Bradycardic and hypotensive episode perioperatively  Please see below for itemized issues addressed in [place]- refer to other progress notes for clarity if needed  Discharge Condition: Improved  Diet recommendation: Renal diabetic  Filed Weights   03/22/21 1320 03/22/21 1705 03/23/21 0600  Weight: 92.2 kg 90.2 kg 81.9 kg    History of present illness:  70 community dwelling white female CKD 4 follows Dr. Cherlyn Cushing baseline creatinine 4.09--previously has refused dialysis-referred to [facility] for kidney transplant eval IDDM IDA HTN Asthma CAD-cardiac cath 61218---30% LAD stenosis  Developed SOB 03/09/2021-CXR pleural effusions-Rx Lasix 60 twice daily and was admitted Became bradycardic hypotensive on attempt at right basilic vein exposure 1/51 Cardiology reconsulted and have since s/o  Eventually [facility] placed 5/26 Started HD successfully Being set-up for OP HD  Developed 5/29 hypoxia requiring oxygen-episodic aspiration 5/30 cleared by speech therapy for regular diet-x-rays suggested possible pneumonia however Repeat x-ray on 531 confirms left-sided infiltrates despite dialysis  Hospital Course:  Perioperative bradycardia hypotension 5/24 while attempting Basilic vn exposure Received calcium adrenaline atropine-resolved--Cardiology S/O---5/26 appreciate input Continue  Coreg 3.125 Declared end-stage renal disease-has outpatient spot MWF Dialysis initiated this admit-TDC right chest placed 5/26-pain resolved form TDC placement c tramadol which limited dose of which will be prescribed IHD initiated 5/26-rest [esa/binders] as per nephrology Lasix 80 TTS per nephrologist Nausea with occasional emesis Suspect that this is related to Renvela versus iron I have asked nephrology to kindly assist prior to discharge to see if any meds may need to be changed-I do not think that this is the case however and we may need to empirically managed with antinausea medication Hypoxia-expected and probably from volume overload Aspiration episode 03/21/2021 Events noted overnight 5/29 Mild hypoxia noted still therefore x-ray was performed 5/30 and patient does have risk for pneumonia/WBC up-Unasyn started Repeat x-rays 5/31 confirmed left-sided pneumonia Switched to Augmentin to complete as above Speech therapy saw the patient and signed off thinking that this was only a one-time episode and she is eating fairly well now Anemia of likely renal disease Iron 43 sat ratios 14-IV iron caused nausea Started Auryxia 5/25 continue-recheck iron levels in 3 weeks HFpEF Cardiac cath 6/18--40% LAD stenosis no intervention at that time Hydralazine 25 every 8 was d/c on discharge resumed Coreg 3.125 on 5/25 As needed hydralazine IV if above 180 D/c Hyzaar Insulin-dependent DM TY 2 A1c 6.2 CBG controlled in the Cut back Levemir this admission 70/30 insulin has been transition to lower doses on discharge because of her renal function and she can adjust this in the outpatient setting   Discharge Exam: Vitals:   03/23/21 1414 03/23/21 1728  BP: 126/60 (!) 146/88  Pulse: 74   Resp: 18   Temp: 98.5 F (36.9 C)   SpO2: 95%     Subj on day of d/c   Awake alert no distress Mild nausea without any distress  General Exam on discharge Obese chest wound where access has  been placed  to this within normal limits Chest is also clear Abdomen is obese nontender She has no lower extremity edema She has a wound in her left upper arm from attempt at the vascular access Good power bilaterally She has mild lower extremity edema  Discharge Instructions   Discharge Instructions    Diet - low sodium heart healthy   Complete by: As directed    Discharge instructions   Complete by: As directed    Please look at all of your meds carefully as several have changed You will need to complete several more days of Augmentin for what I presume is an aspiration pneumonia that we found this hospital stay In addition several medications including the doses such as your Lasix have been changed We will give you a limited prescription of tramadol in addition for expected pain in your chest I would recommend close monitoring of your meds and I will speak to Dr. Posey Pronto before your discharge to see if there are any meds that could be causing nausea however this may be in keeping with some of the new Phosphorus reducing meds Please follow-up in the outpatient setting regarding other things with your nephrologist   Increase activity slowly   Complete by: As directed    No wound care   Complete by: As directed      Allergies as of 03/23/2021      Reactions   Food Anaphylaxis   Peanuts; Almonds   Other Shortness Of Breath   Peanuts; Almonds   Wheat Bran Shortness Of Breath   Statins Itching, Other (See Comments)   Generalized aches   Iodine Other (See Comments)   The patient denied this allergy to me on 03/16/2021   Shellfish Allergy Other (See Comments)   Mouth gets raw   Sitagliptin    Other reaction(s): Unknown   Tetracycline Other (See Comments)   Raw mouth   Tetracyclines & Related Itching   Contrast Media [iodinated Diagnostic Agents] Rash   Happened during CT scan over 30 years ago      Medication List    STOP taking these medications   valsartan-hydrochlorothiazide  320-12.5 MG tablet Commonly known as: DIOVAN-HCT     TAKE these medications   albuterol 108 (90 Base) MCG/ACT inhaler Commonly known as: VENTOLIN HFA Inhale 2 puffs into the lungs every 4 (four) hours as needed for wheezing or shortness of breath.   albuterol (2.5 MG/3ML) 0.083% nebulizer solution Commonly known as: PROVENTIL Take 3 mLs (2.5 mg total) by nebulization every 4 (four) hours as needed for wheezing or shortness of breath.   amoxicillin-clavulanate 500-125 MG tablet Commonly known as: AUGMENTIN Take 1 tablet (500 mg total) by mouth daily.   aspirin 81 MG tablet Take 81 mg by mouth daily.   b complex vitamins capsule Take 1 capsule by mouth daily.   Besivance 0.6 % Susp Generic drug: Besifloxacin HCl Place 1 drop into both eyes See admin instructions. After  eye injections   calcitRIOL 0.25 MCG capsule Commonly known as: ROCALTROL Take 0.25 mcg by mouth 3 (three) times a week.   carvedilol 3.125 MG tablet Commonly known as: COREG Take 1 tablet (3.125 mg total) by mouth 2 (two) times daily with a meal. Start taking on: March 24, 2021   Dexcom G6 Receiver [place] Use as directed to check blood sugars 3 times per day dx: e11.65   Dexcom G6 Sensor Misc Use as directed to check blood sugars 3 times per day dx:  e11.65   Dexcom G6 Transmitter Misc Use as directed to check blood sugars 3 times per day dx: e11.65   Dulera 200-5 MCG/ACT Aero Generic drug: mometasone-formoterol Inhale 2 puffs into the lungs 2 (two) times daily.   FDGARD PO Take 1 tablet by mouth 2 (two) times daily as needed (nausea).   fluticasone 50 MCG/ACT nasal spray Commonly known as: FLONASE Place 2 sprays into the nose daily as needed for allergies.   furosemide 80 MG tablet Commonly known as: Lasix Tuesday Thursday saturday What changed: You were already taking a medication with the same name, and this prescription was added. Make sure you understand how and when to take each.    furosemide 40 MG tablet Commonly known as: LASIX Take 1-2 tablets (40-80 mg total) by mouth every Monday, Wednesday, and Friday. 80 mg twice a week. 40 mg all other days Start taking on: March 24, 2021 What changed: when to take this   iron polysaccharides 150 MG capsule Commonly known as: NIFEREX Take 1 capsule (150 mg total) by mouth daily. Start taking on: March 24, 2021   lactobacillus acidophilus Tabs tablet Take 1 tablet by mouth daily. PROBIOTIC   magnesium oxide 400 (241.3 Mg) MG tablet Commonly known as: MAG-OX Take 400 mg by mouth daily.   NovoLOG Mix 70/30 FlexPen (70-30) 100 UNIT/ML FlexPen Generic drug: insulin aspart protamine - aspart Inject 1 mL (100 Units total) into the skin 2 (two) times daily with a meal. 20 units in the morning and 15 units at night What changed:   how much to take  additional instructions   OneTouch Delica Lancets 25D Misc Use as directed to check blood sugars before breakfast and dinner dx: e11.65   OneTouch Verio test strip Generic drug: glucose blood CHECK BLOOD SUGAR TWICE DAILY AS DIRECTED   polyethylene glycol 17 g packet Commonly known as: MIRALAX / GLYCOLAX Take 17 g by mouth daily. What changed:   when to take this  reasons to take this   REFRESH OP Place 1 drop into both eyes daily as needed (dry eyes).   rosuvastatin 20 MG tablet Commonly known as: CRESTOR Take 1 tablet (20 mg total) by mouth daily.   sevelamer carbonate 800 MG tablet Commonly known as: RENVELA Take 1 tablet (800 mg total) by mouth 3 (three) times daily with meals. Start taking on: March 24, 2021   Symbicort 160-4.5 MCG/ACT inhaler Generic drug: budesonide-formoterol Inhale 2 puffs into the lungs 2 (two) times daily.   traMADol 50 MG tablet Commonly known as: ULTRAM Take 1 tablet (50 mg total) by mouth every 12 (twelve) hours as needed for severe pain.   vitamin C 500 MG tablet Commonly known as: ASCORBIC ACID Take 500 mg by mouth daily.    VITAMIN D3 PO Take 2,000 Units by mouth daily.      Allergies  Allergen Reactions  . Food Anaphylaxis    Peanuts; Almonds  . Other Shortness Of Breath    Peanuts; Almonds  . Wheat Bran Shortness Of Breath  . Statins Itching and Other (See Comments)    Generalized aches  . Iodine Other (See Comments)    The patient denied this allergy to me on 03/16/2021  . Shellfish Allergy Other (See Comments)    Mouth gets raw  . Sitagliptin     Other reaction(s): Unknown  . Tetracycline Other (See Comments)    Raw mouth  . Tetracyclines & Related Itching  . Contrast Media [Iodinated Diagnostic Agents] Rash  Happened during CT scan over 30 years ago    [facility] Follow up on 03/26/2021.   Why: Outpt Dialysis schedule is Mon, Wed, Fri at 6:00am. Please arrive at 5:40am. Please call the clinic 6/1 or 6/2 to arrange a time to go to the clinic on Thurs 6/2 to sign intake papers before 1st appt on 6/3. Take insurance card and ID.  Contact information: [address] [place] [address] [phone]        Angelia Mould, MD Follow up.   Specialties: Vascular Surgery, Cardiology Why: Office will call you with appointment day and time. Contact information: [address] [place] [address] [phone]                The results of significant diagnostics from this hospitalization (including imaging, microbiology, ancillary and laboratory) are listed below for reference.    Significant Diagnostic Studies: DG Chest 2 View  Result Date: 03/23/2021 CLINICAL DATA:  Pneumonia, asthma EXAM: CHEST - 2 VIEW COMPARISON:  03/21/2021 FINDINGS: Right IJ approach hemodialysis catheter terminates at the level of the right atrium. Stable cardiomegaly. Low lung volumes. Patchy left basilar opacity, not appreciably changed from prior. No large pleural fluid collection. No pneumothorax. IMPRESSION: Patchy left basilar opacity, not appreciably  changed from prior. Electronically Signed   By: Davina Poke D.O.   On: 03/23/2021 13:42   DG Chest 2 View  Result Date: 03/10/2021 CLINICAL DATA:  Hilar enlargement on chest radiograph EXAM: CHEST - 2 VIEW COMPARISON:  03/09/2021 FINDINGS: The heart is enlarged. Pulmonary vascular congestion present. Diffuse bilateral airspace opacities, progressed since prior examination. IMPRESSION: Radiographic findings most consistent with CHF/fluid volume overload, which has progressed since the prior exam. Electronically Signed   By: Miachel Roux M.D.   On: 03/10/2021 08:37   DG Chest 2 View  Result Date: 03/09/2021 CLINICAL DATA:  Shortness of breath. Decreased hemoglobin level to 7.2, dyspnea on exertion. EXAM: CHEST - 2 VIEW COMPARISON:  January 29, 2017. FINDINGS: Limited evaluation due to patient body habitus. Trachea is midline. Cardiomediastinal contours with cardiac enlargement and central pulmonary vascular engorgement. Fullness of the RIGHT and LEFT hilum may relate to this phenomenon but is not well assessed. No lobar level consolidative changes. Suggestion of LEFT-sided effusion on lateral radiograph and basilar opacity. On limited assessment no acute skeletal process. IMPRESSION: 1. Cardiomegaly and central pulmonary vascular engorgement. Correlate with any signs of heart failure. 2. Indeterminate fullness of the hila bilaterally. This may relate to congestive changes at the level of the bilateral hila not well assessed in AP projection. When the patient is able consider PA and lateral chest radiograph on follow-up to exclude hilar nodal enlargement. 3. Potential LEFT effusion and basilar airspace disease. Correlate with any signs of heart failure or pneumonia. Electronically Signed   By: Zetta Bills M.D.   On: 03/09/2021 15:48   IR Fluoro Guide CV Line Right  Result Date: 03/18/2021 INDICATION: Renal failure EXAM: Tunneled right IJ hemodialysis catheter placement MEDICATIONS: 2 g Ancef IV; The  antibiotic was administered within an appropriate time interval prior to skin puncture. ANESTHESIA/SEDATION: Moderate (conscious) sedation was employed during this procedure. A total of Versed 1 mg and Fentanyl 25 mcg was administered intravenously. Moderate Sedation Time: 30 minutes. The patient's level of consciousness and vital signs were monitored continuously by radiology nursing throughout the procedure under my direct supervision. FLUOROSCOPY TIME:  Fluoroscopy Time: 0 minutes 54 seconds (6 mGy). COMPLICATIONS: None  immediate. PROCEDURE: Informed written consent was obtained from the patient after a thorough discussion of the procedural risks, benefits and alternatives. All questions were addressed. Maximal Sterile Barrier Technique was utilized including caps, mask, sterile gowns, sterile gloves, sterile drape, hand hygiene and skin antiseptic. A timeout was performed prior to the initiation of the procedure. The right internal jugular vein was evaluated with ultrasound and shown to be patent. A permanent ultrasound image was obtained and placed in the patient's medical record. Using sterile gel and a sterile probe cover, the right internal jugular vein was entered with a 21 ga needle during real time ultrasound guidance. 0.018 inch guidewire placed and 21 ga needle exchanged for transitional dilator set. Utilizing fluoroscopy, 0.035 inch guidewire advanced through the needle without difficulty. Seriel dilation was performed and peel-away sheath was placed. Attention then turned to the right anterior upper chest. Following local lidocaine administration, the hemodialysis catheter was tunneled from the chest wall to the venotomy site. The catheter was inserted through the peel-away sheath. The tip of the catheter was positioned within the right atrium using fluoroscopic guidance. All lumens of the catheter aspirated and flushed well. The dialysis lumens were locked with Heparin. The catheter was secured to  the skin with suture. The insertion site was covered with a Biopatch and sterile dressing. IMPRESSION: 19 cm right IJ tunneled hemodialysis catheter is ready for use. Electronically Signed   By: Miachel Roux M.D.   On: 03/18/2021 11:58   IR US Guide Vasc Access Right  Result Date: 03/18/2021 INDICATION: Renal failure EXAM: Tunneled right IJ hemodialysis catheter placement MEDICATIONS: 2 g Ancef IV; The antibiotic was administered within an appropriate time interval prior to skin puncture. ANESTHESIA/SEDATION: Moderate (conscious) sedation was employed during this procedure. A total of Versed 1 mg and Fentanyl 25 mcg was administered intravenously. Moderate Sedation Time: 30 minutes. The patient's level of consciousness and vital signs were monitored continuously by radiology nursing throughout the procedure under my direct supervision. FLUOROSCOPY TIME:  Fluoroscopy Time: 0 minutes 54 seconds (6 mGy). COMPLICATIONS: None immediate. PROCEDURE: Informed written consent was obtained from the patient after a thorough discussion of the procedural risks, benefits and alternatives. All questions were addressed. Maximal Sterile Barrier Technique was utilized including caps, mask, sterile gowns, sterile gloves, sterile drape, hand hygiene and skin antiseptic. A timeout was performed prior to the initiation of the procedure. The right internal jugular vein was evaluated with ultrasound and shown to be patent. A permanent ultrasound image was obtained and placed in the patient's medical record. Using sterile gel and a sterile probe cover, the right internal jugular vein was entered with a 21 ga needle during real time ultrasound guidance. 0.018 inch guidewire placed and 21 ga needle exchanged for transitional dilator set. Utilizing fluoroscopy, 0.035 inch guidewire advanced through the needle without difficulty. Seriel dilation was performed and peel-away sheath was placed. Attention then turned to the right anterior upper  chest. Following local lidocaine administration, the hemodialysis catheter was tunneled from the chest wall to the venotomy site. The catheter was inserted through the peel-away sheath. The tip of the catheter was positioned within the right atrium using fluoroscopic guidance. All lumens of the catheter aspirated and flushed well. The dialysis lumens were locked with Heparin. The catheter was secured to the skin with suture. The insertion site was covered with a Biopatch and sterile dressing. IMPRESSION: 19 cm right IJ tunneled hemodialysis catheter is ready for use. Electronically Signed   By: Danie Binder.D.  On: 03/18/2021 11:58   DG CHEST PORT 1 VIEW  Result Date: 03/21/2021 CLINICAL DATA:  Recent aspiration EXAM: PORTABLE CHEST 1 VIEW COMPARISON:  03/10/2021 FINDINGS: Cardiac shadow is enlarged but stable. New right jugular dialysis catheter is noted in satisfactory position. Right lung is clear. Left lung demonstrates some mild basilar opacity increased when compared with the prior exam. No bony abnormality is noted. IMPRESSION: Increasing left basilar airspace opacity consistent with focal infiltrate. Electronically Signed   By: Inez Catalina M.D.   On: 03/21/2021 20:22   ECHOCARDIOGRAM COMPLETE  Result Date: 03/11/2021    ECHOCARDIOGRAM REPORT   Patient Name:   Carolyn Russo [facility] Date of Exam: 03/11/2021 Medical Rec #:  [id number]      Height:       63.0 in Accession #:    [id number]     Weight:       218.4 lb Date of Birth:  11/18/1950      BSA:          2.007 m Patient Age:    70 years       BP:           195/93 mmHg Patient Gender: F              HR:           74 bpm. Exam Location:  Inpatient Procedure: 2D Echo, Cardiac Doppler, Color Doppler and Intracardiac            Opacification Agent Indications:    CHF-Acute Diastolic  History:        Patient has no prior history of Echocardiogram examinations.                 CAD, Signs/Symptoms:Shortness of Breath; Risk Factors:Diabetes,                  Hypertension and Dyslipidemia. CKD.  Sonographer:    Clayton Lefort RDCS (AE) Referring Phys: [id number] [place]  1. Left ventricular ejection fraction, by estimation, is 50%. The left ventricle has low normal function. The left ventricle has no regional wall motion abnormalities. There is mild left ventricular hypertrophy. Left ventricular diastolic parameters are  consistent with Grade II diastolic dysfunction (pseudonormalization). Elevated left ventricular end-diastolic pressure. The E/e' is 76.  2. Right ventricular systolic function is normal. The right ventricular size is normal. There is moderately elevated pulmonary artery systolic pressure. The estimated right ventricular systolic pressure is 38.7 mmHg.  3. Left atrial size was mildly dilated.  4. The mitral valve is grossly normal. Trivial mitral valve regurgitation. No evidence of mitral stenosis.  5. The aortic valve is grossly normal. There is mild calcification of the aortic valve. Aortic valve regurgitation is not visualized. Mild aortic valve sclerosis is present, with no evidence of aortic valve stenosis.  6. The inferior vena cava is dilated in size with >50% respiratory variability, suggesting right atrial pressure of 8 mmHg. FINDINGS  Left Ventricle: Left ventricular ejection fraction, by estimation, is 50%. The left ventricle has low normal function. The left ventricle has no regional wall motion abnormalities. Definity contrast agent was given IV to delineate the left ventricular endocardial borders. The left ventricular internal cavity size was normal in size. There is mild left ventricular hypertrophy. Left ventricular diastolic parameters are consistent with Grade II diastolic dysfunction (pseudonormalization). Elevated left ventricular end-diastolic pressure. The E/e' is 53. Right Ventricle: The right ventricular size is normal. No increase in right ventricular wall thickness. Right  ventricular systolic function is normal.  There is moderately elevated pulmonary artery systolic pressure. The tricuspid regurgitant velocity is 3.37 m/s, and with an assumed right atrial pressure of 8 mmHg, the estimated right ventricular systolic pressure is 37.1 mmHg. Left Atrium: Left atrial size was mildly dilated. Right Atrium: Right atrial size was normal in size. Pericardium: There is no evidence of pericardial effusion. Mitral Valve: The mitral valve is grossly normal. Trivial mitral valve regurgitation. No evidence of mitral valve stenosis. MV peak gradient, 5.8 mmHg. The mean mitral valve gradient is 3.0 mmHg. Tricuspid Valve: The tricuspid valve is normal in structure. Tricuspid valve regurgitation is mild . No evidence of tricuspid stenosis. Aortic Valve: The aortic valve is grossly normal. There is mild calcification of the aortic valve. Aortic valve regurgitation is not visualized. Mild aortic valve sclerosis is present, with no evidence of aortic valve stenosis. Aortic valve mean gradient  measures 5.0 mmHg. Aortic valve peak gradient measures 9.7 mmHg. Aortic valve area, by VTI measures 1.55 cm. Pulmonic Valve: The pulmonic valve was grossly normal. Pulmonic valve regurgitation is trivial. No evidence of pulmonic stenosis. Aorta: The aortic root is normal in size and structure. Venous: The inferior vena cava is dilated in size with greater than 50% respiratory variability, suggesting right atrial pressure of 8 mmHg. IAS/Shunts: No atrial level shunt detected by color flow Doppler.  LEFT VENTRICLE PLAX 2D LVIDd:         4.90 cm  Diastology LVIDs:         3.40 cm  LV e' medial:    3.81 cm/s LV PW:         1.15 cm  LV E/e' medial:  27.3 LV IVS:        1.24 cm  LV e' lateral:   5.66 cm/s LVOT diam:     1.80 cm  LV E/e' lateral: 18.4 LV SV:         48 LV SV Index:   24 LVOT Area:     2.54 cm  RIGHT VENTRICLE             IVC RV Basal diam:  2.80 cm     IVC diam: 2.50 cm RV S prime:     12.30 cm/s TAPSE (M-mode): 1.4 cm LEFT ATRIUM              Index       RIGHT ATRIUM           Index LA diam:        4.20 cm 2.09 cm/m  RA Area:     13.30 cm LA Vol (A2C):   68.0 ml 33.88 ml/m RA Volume:   30.70 ml  15.29 ml/m LA Vol (A4C):   73.4 ml 36.57 ml/m LA Biplane Vol: 72.5 ml 36.12 ml/m  AORTIC VALVE AV Area (Vmax):    1.61 cm AV Area (Vmean):   1.61 cm AV Area (VTI):     1.55 cm AV Vmax:           156.00 cm/s AV Vmean:          107.000 cm/s AV VTI:            0.311 m AV Peak Grad:      9.7 mmHg AV Mean Grad:      5.0 mmHg LVOT Vmax:         98.60 cm/s LVOT Vmean:        67.500 cm/s LVOT VTI:  0.189 m LVOT/AV VTI ratio: 0.61  AORTA Ao Root diam: 2.70 cm Ao Asc diam:  2.80 cm MITRAL VALVE                TRICUSPID VALVE MV Area (PHT): 3.91 cm     TR Peak grad:   45.4 mmHg MV Area VTI:   1.67 cm     TR Vmax:        337.00 cm/s MV Peak grad:  5.8 mmHg MV Mean grad:  3.0 mmHg     SHUNTS MV Vmax:       1.20 m/s     Systemic VTI:  0.19 m MV Vmean:      75.0 cm/s    Systemic Diam: 1.80 cm MV Decel Time: 194 msec MV E velocity: 104.00 cm/s MV A velocity: 113.00 cm/s MV E/A ratio:  0.92 Cherlynn Kaiser MD Electronically signed by Cherlynn Kaiser MD Signature Date/Time: 03/11/2021/3:49:11 PM    Final    VAS [place] UPPER EXT VEIN MAPPING (PRE-OP AVF)  Result Date: 03/12/2021 UPPER EXTREMITY VEIN MAPPING Patient Name:  DELLAMAE ROSAMILIA [facility]  Date of Exam:   03/12/2021 Medical Rec #: [id number]       Accession #:    [id number] Date of Birth: 1951-04-30       Patient Gender: F Patient Age:   70Y Exam Location:  [facility] Procedure:      VAS [place] UPPER EXT VEIN MAPPING (PRE-OP AVF) Referring Phys: [place] --------------------------------------------------------------------------------  Indications: Pre-access. Limitations: [place], movement Comparison Study: No previous exam Performing Technologist: Rogelia Rohrer  Examination Guidelines: A complete evaluation includes B-mode imaging, spectral Doppler, color Doppler, and power Doppler as needed of all  accessible portions of each vessel. Bilateral testing is considered an integral part of a complete examination. Limited examinations for reoccurring indications may be performed as noted. +-----------------+-------------+----------+--------------+ Right Cephalic   Diameter (cm)Depth (cm)   Findings    +-----------------+-------------+----------+--------------+ Shoulder             0.36        1.10                  +-----------------+-------------+----------+--------------+ Prox upper arm       0.25        1.46                  +-----------------+-------------+----------+--------------+ Mid upper arm        0.21        0.77                  +-----------------+-------------+----------+--------------+ Dist upper arm       0.20        0.40                  +-----------------+-------------+----------+--------------+ Antecubital fossa    0.16        0.56                  +-----------------+-------------+----------+--------------+ Prox forearm         0.11        0.27     branching    +-----------------+-------------+----------+--------------+ Mid forearm                             not visualized +-----------------+-------------+----------+--------------+ Dist forearm         0.06        0.22                  +-----------------+-------------+----------+--------------+  Wrist                                   not visualized +-----------------+-------------+----------+--------------+ +-----------------+-------------+----------+--------------+ Right Basilic    Diameter (cm)Depth (cm)   Findings    +-----------------+-------------+----------+--------------+ Prox upper arm       0.50                              +-----------------+-------------+----------+--------------+ Mid upper arm        0.29                              +-----------------+-------------+----------+--------------+ Dist upper arm       0.35                               +-----------------+-------------+----------+--------------+ Antecubital fossa    0.29                 branching    +-----------------+-------------+----------+--------------+ Prox forearm         0.07                              +-----------------+-------------+----------+--------------+ Mid forearm          0.07                              +-----------------+-------------+----------+--------------+ Distal forearm       0.05                              +-----------------+-------------+----------+--------------+ Wrist                                   not visualized +-----------------+-------------+----------+--------------+ +-----------------+-------------+----------+---------+ Left Cephalic    Diameter (cm)Depth (cm)Findings  +-----------------+-------------+----------+---------+ Shoulder             0.20        1.09             +-----------------+-------------+----------+---------+ Prox upper arm       0.14        0.52             +-----------------+-------------+----------+---------+ Mid upper arm        0.22        0.50             +-----------------+-------------+----------+---------+ Dist upper arm       0.16        0.36             +-----------------+-------------+----------+---------+ Antecubital fossa    0.16        0.65             +-----------------+-------------+----------+---------+ Prox forearm         0.12        0.19   branching +-----------------+-------------+----------+---------+ Mid forearm          0.09        0.34             +-----------------+-------------+----------+---------+ Dist forearm         0.06  0.24             +-----------------+-------------+----------+---------+ Wrist                0.05        0.28             +-----------------+-------------+----------+---------+ +-----------------+-------------+----------+--------------+ Left Basilic     Diameter (cm)Depth (cm)   Findings     +-----------------+-------------+----------+--------------+ Prox upper arm       0.42                              +-----------------+-------------+----------+--------------+ Mid upper arm        0.25                              +-----------------+-------------+----------+--------------+ Dist upper arm       0.22                              +-----------------+-------------+----------+--------------+ Antecubital fossa    0.13                 branching    +-----------------+-------------+----------+--------------+ Prox forearm         0.15                              +-----------------+-------------+----------+--------------+ Mid forearm          0.08                              +-----------------+-------------+----------+--------------+ Distal forearm       0.09                              +-----------------+-------------+----------+--------------+ Wrist                                   not visualized +-----------------+-------------+----------+--------------+ *See table(s) above for measurements and observations.  Diagnosing physician: Deitra Mayo MD Electronically signed by Deitra Mayo MD on 03/12/2021 at 8:15:20 PM.    Final     Microbiology: No results found for this or any previous visit (from the past 240 hour(s)).   Labs: Basic Metabolic Panel: Recent Labs  Lab 03/19/21 0752 03/20/21 0224 03/21/21 0609 03/22/21 0211 03/23/21 0348  NA 140 139 136 137 138  K 4.2 3.9 3.8 3.7 4.1  CL 107 102 100 102 100  CO2 24 27 29 28 29   GLUCOSE 81 113* 139* 106* 155*  BUN 62* 34* 19 25* 13  CREATININE 5.47* 3.94* 3.45* 4.48* 3.13*  CALCIUM 8.1* 8.3* 8.2* 8.7* 8.4*  PHOS 5.0* 4.1 3.4 3.8 3.0   Liver Function Tests: Recent Labs  Lab 03/19/21 0752 03/20/21 0224 03/21/21 0609 03/22/21 0211 03/23/21 0348  ALBUMIN 2.7* 2.7* 2.6* 2.8* 2.7*   No results for input(s): LIPASE, AMYLASE in the last 168 hours. No results for input(s): AMMONIA  in the last 168 hours. CBC: Recent Labs  Lab 03/17/21 0753 03/18/21 0322 03/19/21 0752 03/20/21 0224 03/22/21 0211 03/23/21 0348  WBC 15.1* 10.8* 10.2 8.6 12.2* 11.8*  NEUTROABS 12.3* 7.0  --  4.2 6.1 5.9  HGB 8.2* 7.6* 7.4* 7.9* 8.1*  8.1*  HCT 24.7* 22.9* 22.3* 24.1* 24.9* 25.0*  MCV 86.1 85.4 85.4 86.1 87.4 87.7  PLT 440* 396 365 356 331 298   Cardiac Enzymes: No results for input(s): CKTOTAL, CKMB, CKMBINDEX, TROPONINI in the last 168 hours. BNP: BNP (last 3 results) Recent Labs    05/19/20 1212 03/09/21 1435  BNP 104.4* 818.7*    ProBNP (last 3 results) No results for input(s): PROBNP in the last 8760 hours.  CBG: Recent Labs  Lab 03/22/21 1740 03/22/21 2206 03/23/21 0554 03/23/21 1156 03/23/21 1620  GLUCAP 121* 238* 129* 201* 219*       Signed:  Nita Sells MD   [facility] 03/23/2021, 5:54 PM

## 2021-03-23 NOTE — Progress Notes (Signed)
  Speech Language Pathology Treatment: Dysphagia  Patient Details Name: Carolyn Russo MRN: [id number] DOB: [birth year] Today's Date: 03/23/2021 Time: 1209-1226 SLP Time Calculation (min) (ACUTE ONLY): 17 min  Assessment / Plan / Recommendation Clinical Impression  Pt and RN deny any overt difficulties with swallowing since evaluation on previous date, and she has no signs of dysphagia nor aspiration during skilled observation today. Pt consumed regular solids and pills given whole by RN with use of a liquid wash PRN. No signs of aspiration were noted. Pt verbalized all of her aspiration precautions when asked. Recommend continuing with regular solids and thin liquids. No further acute SLP interventions indicated at this time.    HPI HPI: Pt is a 70 y/o female admitted secondary to SOB and bilateral LE edema, found to have acute on chronic diastolic CHF. Pt is now s/p first stage right basilic vein transposition on 5/24 and tunneled HD catheter placement on 5/26. Swallowing eval was ordered after concern for choking event on the evening of 5/29 involving collards with vinegar. CXR that evening revealed increasing L basilar airspace opacity consistent with focal infiltrate. PMH including but not limited to GERD, CKD stage IV or V, insulin-dependent diabetes mellitus, CAD, iron deficiency anemia, hypertension, and asthma.      SLP Plan  All goals met       Recommendations  Diet recommendations: Regular;Thin liquid Liquids provided via: Cup;Straw Medication Administration: Whole meds with liquid Supervision: Patient able to self feed;Intermittent supervision to cue for compensatory strategies Compensations: Slow rate;Small sips/bites Postural Changes and/or Swallow Maneuvers: Seated upright 90 degrees;Upright 30-60 min after meal                Oral Care Recommendations: Oral care BID Follow up Recommendations: None SLP Visit Diagnosis: Dysphagia, unspecified (R13.10) Plan: All goals  met       GO                Osie Bond., M.A. [place] [facility] Pager [phone] Office [phone]  03/23/2021, 12:29 PM

## 2021-03-23 NOTE — Progress Notes (Signed)
Per Dr. Verlon Au, patient needs another chest x-ray tomorrow after HD here. In this case, patient will need to sign intake paperwork at [facility] on Thursday in order to start in the clinic on Friday, 03/26/21. Navigator met with patient at bedside to discuss verbally and provide written schedule letter. Navigator has also documented this in her AVS. Patient tells Navigator that she has an appointment at [facility] this Thursday. Navigator informed her that she will no longer need to follow up with her Nephrologist in the office and offered to call and cancel the appointment while I was in the room with her. She agreed. Appointment cancelled and Navigator explained that patient will see a Nephrologist, PA or NP now while she is at dialysis. She stated understanding. Navigator informed Nephrologist of cancelling her appointment as well. Navigator left message for Dr. [name] to update him on above also.   Alphonzo Cruise, [place] [facility] Navigator [phone]

## 2021-03-23 NOTE — Plan of Care (Signed)

## 2021-03-23 NOTE — Progress Notes (Signed)
Physical Therapy Treatment Patient Details Name: Carolyn Russo MRN: [id number] DOB: [birth year] Today's Date: 03/23/2021    History of Present Illness Pt is a 70 y/o female admitted secondary to SOB and bilateral LE edema, found to have acute on chronic diastolic CHF. Pt is now s/p first stage right basilic vein transposition on 5/24 and tunneled HD catheter placement on 5/26. PMH including but not limited to CKD stage IV or V, insulin-dependent diabetes mellitus, CAD, iron deficiency anemia, hypertension, and asthma.    PT Comments    Patient initially agreeable to mobility however in the midst of getting to EOB and standing, pt immediately distracted and trying to call husband on phone, stating she felt nauseous and then requesting to eat. Requires assist to donn briefs sitting EOB and pulling them up in standing requiring UE support due to imbalance. Pt taking deep breaths throughout session. Sp02 remained in mid-high 90s on RA throughout activity. Pt requesting she needs someone to show her how to use her [facility] despite using it for the last year per report however not willing to mobilize. Discharge recommendation updated to HHPT if pt willing. Will follow.   Follow Up Recommendations  Home health PT;Supervision - Intermittent     Equipment Recommendations  Other (comment) (pending further assessment)    Recommendations for Other Services       Precautions / Restrictions Precautions Precautions: Fall Restrictions Weight Bearing Restrictions: No    Mobility  Bed Mobility Overal bed mobility: Needs Assistance Bed Mobility: Supine to Sit     Supine to sit: Supervision;HOB elevated     General bed mobility comments: Heavy use of rails to get to EOB.    Transfers Overall transfer level: Needs assistance Equipment used: None Transfers: Sit to/from Stand Sit to Stand: Min guard         General transfer comment: Min guard for safety. Pulling up on bed rail to stand from  EOB, needs UE support in standing.  Ambulation/Gait             General Gait Details: Declines further mobility. Reports she has been walking to bathroom- unsure of this??   Marine scientist [place] (Stroke Patients Only)       Balance Overall balance assessment: Needs assistance Sitting-balance support: No upper extremity supported;Feet supported Sitting balance-Leahy Scale: Good Sitting balance - Comments: Able to placd 1 foot in pamper but needs assist placing other foot in.   Standing balance support: During functional activity Standing balance-Leahy Scale: Poor Standing balance comment: Requires UE support in standing and total A to pull up briefs.                            Cognition Arousal/Alertness: Awake/alert Behavior During Therapy: Flat affect Overall Cognitive Status: No family/caregiver present to determine baseline cognitive functioning                                 General Comments: Requires repetition at times due to [facility]. Agreeable to mobility but then in the middle, calling husband. "I feel nauseous," "I should prob eat now."      Exercises      General Comments General comments (skin integrity, edema, etc.): SP02 remained in mid-high 90s on RA.      Pertinent Vitals/Pain Pain  Assessment: Faces Faces Pain Scale: No hurt    Home Living                      Prior Function            PT Goals (current goals can now be found in the care plan section) Progress towards PT goals: Not progressing toward goals - comment (due to feeling tired and nausea)    Frequency    Min 3X/week      PT Plan Discharge plan needs to be updated    Co-evaluation              AM-PAC PT "6 Clicks" Mobility   Outcome Measure  Help needed turning from your back to your side while in a flat bed without using bedrails?: None Help needed moving from lying on your back to  sitting on the side of a flat bed without using bedrails?: A Little Help needed moving to and from a bed to a chair (including a wheelchair)?: A Little Help needed standing up from a chair using your arms (e.g., wheelchair or bedside chair)?: A Little Help needed to walk in hospital room?: A Little Help needed climbing 3-5 steps with a railing? : A Lot 6 Click Score: 18    End of Session   Activity Tolerance: Patient limited by fatigue;Other (comment) (nausea) Patient left: in bed;with call bell/phone within reach Nurse Communication: Mobility status;Other (comment) (needs nausea meds) PT Visit Diagnosis: Other abnormalities of gait and mobility (R26.89);Muscle weakness (generalized) (M62.81);Unsteadiness on feet (R26.81)     Time: 9150-5697 PT Time Calculation (min) (ACUTE ONLY): 17 min  Charges:  $Therapeutic Activity: 8-22 mins                     Marisa Severin, PT, DPT Acute Rehabilitation Services Pager [phone] Office [place] 03/23/2021, 8:30 AM

## 2021-03-23 NOTE — Progress Notes (Signed)
Patient ID: Carolyn Russo, female   DOB: September 26, 1951, 70 y.o.   MRN: [id number] [facility] Progress Note   Assessment/ Plan:   1.  End-stage renal disease: Following progressive chronic kidney disease with attempts to undertake AV fistula placement on 5/24 that was aborted after she developed bradycardia/hypotension.  Dialysis access will be pursued as an outpatient.  Continue dialysis via [facility] on MWF schedule with anticipated discharge home likely today to continue hemodialysis at [facility]. 2.  Acute exacerbation of diastolic heart failure: With improving volume status with hemodialysis and status post furosemide (continue on nondialysis days to help augment urine output/volume management). 3.  Hypertension: Continue efforts at ultrafiltration for additional optimization of blood pressure control while on low-dose carvedilol (limited by bradycardia) and hydralazine. 4.  Anemia of chronic kidney disease: Without overt blood loss, continue Aranesp at this time for management of anemia.  With low iron saturation but previously developed nausea/intolerance to Feraheme.  The plan is to retry IV iron as an outpatient (Venofer). 5.  Secondary hyperparathyroidism: Continue calcitriol for PTH suppression and Renvela for phosphorus binding.  Subjective:   Tolerated hemodialysis yesterday afternoon without problems.  No acute events overnight.   Objective:   BP (!) 179/63 (BP Location: Left Arm)   Pulse 77   Temp 98.5 F (36.9 C) (Oral)   Resp 18   Ht 5\' 3"  (1.6 m)   Wt 81.9 kg   SpO2 99%   BMI 31.97 kg/m   Intake/Output Summary (Last 24 hours) at 03/23/2021 0830 Last data filed at 03/22/2021 1800 Gross per 24 hour  Intake 250 ml  Output 2000 ml  Net -1750 ml   Weight change: 1.662 kg  Physical Exam: Gen: Comfortably sitting up in bed eating breakfast, not on supplemental oxygen CVS: Pulse regular rhythm, normal rate, S1 and S2 normal Resp: Anteriorly clear to  auscultation, no rales/rhonchi.  Right IJ TDC Abd: Soft, obese, nontender, bowel sounds normal Ext: Trace-1+ right lower extremity edema, trace left lower extremity edema  Imaging: DG CHEST PORT 1 VIEW  Result Date: 03/21/2021 CLINICAL DATA:  Recent aspiration EXAM: PORTABLE CHEST 1 VIEW COMPARISON:  03/10/2021 FINDINGS: Cardiac shadow is enlarged but stable. New right jugular dialysis catheter is noted in satisfactory position. Right lung is clear. Left lung demonstrates some mild basilar opacity increased when compared with the prior exam. No bony abnormality is noted. IMPRESSION: Increasing left basilar airspace opacity consistent with focal infiltrate. Electronically Signed   By: Inez Catalina M.D.   On: 03/21/2021 20:22    Labs: BMET Recent Labs  Lab 03/17/21 0753 03/18/21 0322 03/19/21 0752 03/20/21 0224 03/21/21 0609 03/22/21 0211 03/23/21 0348  NA 137 138 140 139 136 137 138  K 4.5 4.6 4.2 3.9 3.8 3.7 4.1  CL 104 105 107 102 100 102 100  CO2 19* 21* 24 27 29 28 29   GLUCOSE 184* 147* 81 113* 139* 106* 155*  BUN 89* 93* 62* 34* 19 25* 13  CREATININE 6.84* 6.83* 5.47* 3.94* 3.45* 4.48* 3.13*  CALCIUM 8.7* 8.5* 8.1* 8.3* 8.2* 8.7* 8.4*  PHOS 7.1* 7.5* 5.0* 4.1 3.4 3.8 3.0   CBC Recent Labs  Lab 03/18/21 0322 03/19/21 0752 03/20/21 0224 03/22/21 0211 03/23/21 0348  WBC 10.8* 10.2 8.6 12.2* 11.8*  NEUTROABS 7.0  --  4.2 6.1 5.9  HGB 7.6* 7.4* 7.9* 8.1* 8.1*  HCT 22.9* 22.3* 24.1* 24.9* 25.0*  MCV 85.4 85.4 86.1 87.4 87.7  PLT 396 365 356 331  298    Medications:    . aspirin  81 mg Oral Daily  . calcitRIOL  0.25 mcg Oral Once per day on Mon Wed Fri  . carvedilol  3.125 mg Oral BID WC  . Chlorhexidine Gluconate Cloth  6 each Topical Daily  . darbepoetin (ARANESP) injection - NON-DIALYSIS  60 mcg Subcutaneous Q Sat-1800  . fluticasone  2 spray Each Nare Daily  . furosemide  80 mg Intravenous Once  . heparin  5,000 Units Subcutaneous Q8H  . hydrALAZINE  25 mg  Oral Q8H  . insulin aspart  0-5 Units Subcutaneous QHS  . insulin aspart  0-9 Units Subcutaneous TID WC  . insulin detemir  6 Units Subcutaneous BID  . iron polysaccharides  150 mg Oral Daily  . mometasone-formoterol  2 puff Inhalation BID  . pneumococcal 23 valent vaccine  0.5 mL Intramuscular Tomorrow-1000  . rosuvastatin  10 mg Oral Daily  . sevelamer carbonate  800 mg Oral TID WC   Elmarie Shiley, MD 03/23/2021, 8:30 AM

## 2021-03-23 NOTE — Telephone Encounter (Signed)
  Care Management    Consult Note  03/23/2021 Name: Carolyn Russo MRN: [id number] DOB: 04-11-1951  Care management team received notification of patient's current inpatient hospitalization related to Acute Pulmonary Edema and Acute CHF .Based on review of health record, Bettyann CHRISTY EHRSAM is not currently enrolled in the CCM program but is eligible for CCM services based on qualifying diagnoses and visit with PCP in the last 12 months.   Plan: Collaboration with patients primary provider, Dr. Baird Cancer to request a referral for the Chronic Care Management program if she feels the patient would benefit.  Daneen Schick, BSW, CDP Social Worker, Certified Dementia Practitioner [facility] [phone]

## 2021-03-24 DIAGNOSIS — Z992 Dependence on renal dialysis: Secondary | ICD-10-CM | POA: Diagnosis not present

## 2021-03-24 DIAGNOSIS — T17908A Unspecified foreign body in respiratory tract, part unspecified causing other injury, initial encounter: Secondary | ICD-10-CM | POA: Diagnosis not present

## 2021-03-24 DIAGNOSIS — R918 Other nonspecific abnormal finding of lung field: Secondary | ICD-10-CM | POA: Diagnosis not present

## 2021-03-24 DIAGNOSIS — E1122 Type 2 diabetes mellitus with diabetic chronic kidney disease: Secondary | ICD-10-CM | POA: Diagnosis not present

## 2021-03-24 DIAGNOSIS — N186 End stage renal disease: Secondary | ICD-10-CM | POA: Diagnosis not present

## 2021-03-24 DIAGNOSIS — N185 Chronic kidney disease, stage 5: Secondary | ICD-10-CM | POA: Diagnosis not present

## 2021-03-24 DIAGNOSIS — I5033 Acute on chronic diastolic (congestive) heart failure: Secondary | ICD-10-CM | POA: Diagnosis not present

## 2021-03-24 LAB — CBC
HCT: 25.4 % — ABNORMAL LOW (ref 36.0–46.0)
Hemoglobin: 8 g/dL — ABNORMAL LOW (ref 12.0–15.0)
MCH: 28 pg (ref 26.0–34.0)
MCHC: 31.5 g/dL (ref 30.0–36.0)
MCV: 88.8 fL (ref 80.0–100.0)
Platelets: 310 10*3/uL (ref 150–400)
RBC: 2.86 MIL/uL — ABNORMAL LOW (ref 3.87–5.11)
RDW: 14.9 % (ref 11.5–15.5)
WBC: 12.9 10*3/uL — ABNORMAL HIGH (ref 4.0–10.5)
nRBC: 0 % (ref 0.0–0.2)

## 2021-03-24 LAB — RENAL FUNCTION PANEL
Albumin: 2.7 g/dL — ABNORMAL LOW (ref 3.5–5.0)
Anion gap: 9 (ref 5–15)
BUN: 24 mg/dL — ABNORMAL HIGH (ref 8–23)
CO2: 27 mmol/L (ref 22–32)
Calcium: 8.6 mg/dL — ABNORMAL LOW (ref 8.9–10.3)
Chloride: 99 mmol/L (ref 98–111)
Creatinine, Ser: 4.57 mg/dL — ABNORMAL HIGH (ref 0.44–1.00)
GFR, Estimated: 10 mL/min — ABNORMAL LOW (ref >60)
Glucose, Bld: 173 mg/dL — ABNORMAL HIGH (ref 70–99)
Phosphorus: 3.7 mg/dL (ref 2.5–4.6)
Potassium: 4.3 mmol/L (ref 3.5–5.1)
Sodium: 135 mmol/L (ref 135–145)

## 2021-03-24 LAB — GLUCOSE, CAPILLARY
Glucose-Capillary: 137 mg/dL — ABNORMAL HIGH (ref 70–99)
Glucose-Capillary: 152 mg/dL — ABNORMAL HIGH (ref 70–99)
Glucose-Capillary: 175 mg/dL — ABNORMAL HIGH (ref 70–99)

## 2021-03-24 MED ORDER — KIDNEY FAILURE BOOK: Freq: Once | Status: AC

## 2021-03-24 MED ORDER — HEPARIN SODIUM (PORCINE) 1000 UNIT/ML IJ SOLN
INTRAMUSCULAR | Status: AC
  Administered 2021-03-24: 3200 [IU] via INTRAVENOUS_CENTRAL
  Filled 2021-03-24: qty 4

## 2021-03-24 MED ORDER — HEPARIN SODIUM (PORCINE) 1000 UNIT/ML DIALYSIS: 40.0000 [IU]/kg | INTRAMUSCULAR | Status: DC | PRN

## 2021-03-24 MED ORDER — TRAMADOL HCL 50 MG PO TABS: 50.0000 mg | ORAL_TABLET | Freq: Two times a day (BID) | ORAL | 0 refills | Status: DC | PRN

## 2021-03-24 NOTE — Procedures (Signed)
Patient seen on Hemodialysis. BP (!) 161/77   Pulse 70   Temp 98.2 F (36.8 C) (Oral)   Resp (!) 26   Ht 5\' 3"  (1.6 m)   Wt 91 kg   SpO2 98%   BMI 35.54 kg/m   QB 400, UF goal 2.5L Tolerating treatment without complaints at this time- nausea resolved after stopping [place].   Elmarie Shiley MD [facility]. Office # [phone] Pager # [phone] 10:20 AM

## 2021-03-24 NOTE — Plan of Care (Signed)
  Problem: Activity: Goal: Risk for activity intolerance will decrease Outcome: Progressing   Problem: Nutrition: Goal: Adequate nutrition will be maintained Outcome: Progressing   Problem: Safety: Goal: Ability to remain free from injury will improve Outcome: Progressing   Problem: Clinical Measurements: Goal: Ability to maintain clinical measurements within normal limits will improve Outcome: Progressing Goal: Will remain free from infection Outcome: Progressing Goal: Diagnostic test results will improve Outcome: Progressing Goal: Respiratory complications will improve Outcome: Progressing Goal: Cardiovascular complication will be avoided Outcome: Progressing   

## 2021-03-24 NOTE — Progress Notes (Signed)
Rounded on patient today in correlation to transition to outpatient HD. Ordered Kidney Failure book. Patient educated at the bedside regarding care of tunneled dialysis catheter, AV fistula site care, assessment of thrill daily and proper medication administration on HD days.  Patient also educated on the importance of adhering to scheduled dialysis treatments, the effects of fluid overload, hyperkalemia and hyperphosphatemia. Patient capable of re-verbalizing via teach back method. Also educated patient on services available through the interdisciplinary team in the clinic setting and the differences they will note when transitioning to the outpatient setting. Patient reports that her husband is a physician and that she also has multiple family members who have been through dialysis. Patient with no further questions at this time. Handouts and contact information provided to patient for any further assistance. Plan for discharge today.  Dorthey Sawyer, RN  Dialysis Nurse Coordinator Phone: [phone]

## 2021-03-24 NOTE — Progress Notes (Signed)
Report received from [name], RN  Carolyn Russo arrived to hemodialysis unit, stable on room air, no problems or distress. She was started on HD without issues, catheter flushing and drawing well. Time on was 0825, tx end time set for 1156

## 2021-03-24 NOTE — Discharge Summary (Addendum)
Physician Discharge Summary  Carolyn Russo PNT:[id number] DOB: 29-Jun-1951 DOA: 03/09/2021  PCP: [name], MD  Admit date: 03/09/2021 Discharge date: 03/24/2021  Admitted From: Home Disposition:  Home  Recommendations for Outpatient Follow-up:  1. Follow up with PCP in 1-2 weeks 2. Please obtain BMP/CBC in one week   Home Health:No Equipment/Devices:None  Discharge Condition:Stable CODE STATUS:Full Diet recommendation: Heart Healthy   Discharge Diagnoses:  Principal Problem:   Acute on chronic diastolic CHF (congestive heart failure) (HCC) Active Problems:   Type 2 diabetes mellitus (HCC)   CAD (coronary artery disease), native coronary artery   Asthma   Anemia in chronic kidney disease   CKD (chronic kidney disease), stage V (HCC)   Hypertensive urgency  Preoperative bradycardia with hypotension while attempting basilic vein exposure: He received calcium, insulin and atropine and resolved. Cardiology was consulted, Coreg dose was lowered.  Newly end-stage renal disease: Nephrology was consulted tunneled dialysis catheter placed on 03/17/2020 and she has been tolerating her dialysis.She is tolerating her HD. She will go on lasix orally as an outpatient, renal to titrate as needed.  Aspiration pneumonia: Overnight on 03/21/2022 she was started on IV Unasyn and her respiration improved changed to oral Augmentin which should continue as an outpatient.  Anemia likely due to renal disease: Continue EPO and IV iron per nephrology.  Chronic diastolic heart failure: She was continue on hydralazine, Coreg at lower dose, Hyzaar was discontinued.  Insulin-dependent diabetes mellitus: No change made to her insulin she will continue 7030 twice a day as an outpatient.   Discharge Instructions  Discharge Instructions    Diet - low sodium heart healthy   Complete by: As directed    Diet - low sodium heart healthy   Complete by: As directed    Discharge instructions    Complete by: As directed    Please look at all of your meds carefully as several have changed You will need to complete several more days of Augmentin for what I presume is an aspiration pneumonia that we found this hospital stay In addition several medications including the doses such as your Lasix have been changed We will give you a limited prescription of tramadol in addition for expected pain in your chest I would recommend close monitoring of your meds and I will speak to Dr. Posey Pronto before your discharge to see if there are any meds that could be causing nausea however this may be in keeping with some of the new Phosphorus reducing meds Please follow-up in the outpatient setting regarding other things with your nephrologist   Increase activity slowly   Complete by: As directed    Increase activity slowly   Complete by: As directed    No wound care   Complete by: As directed    No wound care   Complete by: As directed      Allergies as of 03/24/2021      Reactions   Food Anaphylaxis   Peanuts; Almonds   Other Shortness Of Breath   Peanuts; Almonds   Wheat Bran Shortness Of Breath   Statins Itching, Other (See Comments)   Generalized aches   Iodine Other (See Comments)   The patient denied this allergy to me on 03/16/2021   Shellfish Allergy Other (See Comments)   Mouth gets raw   Sitagliptin    Other reaction(s): Unknown   Tetracycline Other (See Comments)   Raw mouth   Tetracyclines & Related Itching   Contrast Media [iodinated Diagnostic Agents]  Rash   Happened during CT scan over 30 years ago      Medication List    STOP taking these medications   valsartan-hydrochlorothiazide 320-12.5 MG tablet Commonly known as: DIOVAN-HCT     TAKE these medications   albuterol 108 (90 Base) MCG/ACT inhaler Commonly known as: VENTOLIN HFA Inhale 2 puffs into the lungs every 4 (four) hours as needed for wheezing or shortness of breath.   albuterol (2.5 MG/3ML) 0.083% nebulizer  solution Commonly known as: PROVENTIL Take 3 mLs (2.5 mg total) by nebulization every 4 (four) hours as needed for wheezing or shortness of breath.   amoxicillin-clavulanate 500-125 MG tablet Commonly known as: AUGMENTIN Take 1 tablet (500 mg total) by mouth daily.   aspirin 81 MG tablet Take 81 mg by mouth daily.   b complex vitamins capsule Take 1 capsule by mouth daily.   Besivance 0.6 % Susp Generic drug: Besifloxacin HCl Place 1 drop into both eyes See admin instructions. After  eye injections   calcitRIOL 0.25 MCG capsule Commonly known as: ROCALTROL Take 0.25 mcg by mouth 3 (three) times a week.   carvedilol 3.125 MG tablet Commonly known as: COREG Take 1 tablet (3.125 mg total) by mouth 2 (two) times daily with a meal.   Dexcom G6 Receiver Devi Use as directed to check blood sugars 3 times per day dx: e11.65   Dexcom G6 Sensor Misc Use as directed to check blood sugars 3 times per day dx: e11.65   Dexcom G6 Transmitter Misc Use as directed to check blood sugars 3 times per day dx: e11.65   Dulera 200-5 MCG/ACT Aero Generic drug: mometasone-formoterol Inhale 2 puffs into the lungs 2 (two) times daily.   FDGARD PO Take 1 tablet by mouth 2 (two) times daily as needed (nausea).   fluticasone 50 MCG/ACT nasal spray Commonly known as: FLONASE Place 2 sprays into the nose daily as needed for allergies.   furosemide 80 MG tablet Commonly known as: Lasix Tuesday Thursday saturday What changed: You were already taking a medication with the same name, and this prescription was added. Make sure you understand how and when to take each.   furosemide 40 MG tablet Commonly known as: LASIX Take 1-2 tablets (40-80 mg total) by mouth every Monday, Wednesday, and Friday. 80 mg twice a week. 40 mg all other days What changed: when to take this   iron polysaccharides 150 MG capsule Commonly known as: NIFEREX Take 1 capsule (150 mg total) by mouth daily.   lactobacillus  acidophilus Tabs tablet Take 1 tablet by mouth daily. PROBIOTIC   magnesium oxide 400 (241.3 Mg) MG tablet Commonly known as: MAG-OX Take 400 mg by mouth daily.   NovoLOG Mix 70/30 FlexPen (70-30) 100 UNIT/ML FlexPen Generic drug: insulin aspart protamine - aspart Inject 1 mL (100 Units total) into the skin 2 (two) times daily with a meal. 20 units in the morning and 15 units at night What changed:   how much to take  additional instructions   OneTouch Delica Lancets 16X Misc Use as directed to check blood sugars before breakfast and dinner dx: e11.65   OneTouch Verio test strip Generic drug: glucose blood CHECK BLOOD SUGAR TWICE DAILY AS DIRECTED   polyethylene glycol 17 g packet Commonly known as: MIRALAX / GLYCOLAX Take 17 g by mouth daily. What changed:   when to take this  reasons to take this   REFRESH OP Place 1 drop into both eyes daily as needed (dry  eyes).   rosuvastatin 20 MG tablet Commonly known as: CRESTOR Take 1 tablet (20 mg total) by mouth daily.   sevelamer carbonate 800 MG tablet Commonly known as: RENVELA Take 1 tablet (800 mg total) by mouth 3 (three) times daily with meals.   Symbicort 160-4.5 MCG/ACT inhaler Generic drug: budesonide-formoterol Inhale 2 puffs into the lungs 2 (two) times daily.   traMADol 50 MG tablet Commonly known as: ULTRAM Take 1 tablet (50 mg total) by mouth every 12 (twelve) hours as needed for severe pain.   vitamin C 500 MG tablet Commonly known as: ASCORBIC ACID Take 500 mg by mouth daily.   VITAMIN D3 PO Take 2,000 Units by mouth daily.       [facility] Follow up on 03/26/2021.   Why: Outpt Dialysis schedule is Mon, Wed, Fri at 6:00am. Please arrive at 5:40am. Please call the clinic 6/1 or 6/2 to arrange a time to go to the clinic on Thurs 6/2 to sign intake papers before 1st appt on 6/3. Take insurance card and ID.  Contact information: [address] [place]  [address] [phone]        Angelia Mould, MD Follow up.   Specialties: Vascular Surgery, Cardiology Why: Office will call you with appointment day and time. Contact information: [address] [place] [address] [phone]              Allergies  Allergen Reactions  . Food Anaphylaxis    Peanuts; Almonds  . Other Shortness Of Breath    Peanuts; Almonds  . Wheat Bran Shortness Of Breath  . Statins Itching and Other (See Comments)    Generalized aches  . Iodine Other (See Comments)    The patient denied this allergy to me on 03/16/2021  . Shellfish Allergy Other (See Comments)    Mouth gets raw  . Sitagliptin     Other reaction(s): Unknown  . Tetracycline Other (See Comments)    Raw mouth  . Tetracyclines & Related Itching  . Contrast Media [Iodinated Diagnostic Agents] Rash    Happened during CT scan over 30 years ago    Consultations:  Cardiology  Nephrology   Procedures/Studies: DG Chest 2 View  Result Date: 03/23/2021 CLINICAL DATA:  Pneumonia, asthma EXAM: CHEST - 2 VIEW COMPARISON:  03/21/2021 FINDINGS: Right IJ approach hemodialysis catheter terminates at the level of the right atrium. Stable cardiomegaly. Low lung volumes. Patchy left basilar opacity, not appreciably changed from prior. No large pleural fluid collection. No pneumothorax. IMPRESSION: Patchy left basilar opacity, not appreciably changed from prior. Electronically Signed   By: Davina Poke D.O.   On: 03/23/2021 13:42   DG Chest 2 View  Result Date: 03/10/2021 CLINICAL DATA:  Hilar enlargement on chest radiograph EXAM: CHEST - 2 VIEW COMPARISON:  03/09/2021 FINDINGS: The heart is enlarged. Pulmonary vascular congestion present. Diffuse bilateral airspace opacities, progressed since prior examination. IMPRESSION: Radiographic findings most consistent with CHF/fluid volume overload, which has progressed since the prior exam. Electronically Signed   By: Miachel Roux M.D.   On:  03/10/2021 08:37   DG Chest 2 View  Result Date: 03/09/2021 CLINICAL DATA:  Shortness of breath. Decreased hemoglobin level to 7.2, dyspnea on exertion. EXAM: CHEST - 2 VIEW COMPARISON:  January 29, 2017. FINDINGS: Limited evaluation due to patient body habitus. Trachea is midline. Cardiomediastinal contours with cardiac enlargement and central pulmonary vascular engorgement. Fullness of the RIGHT and LEFT hilum may relate to  this phenomenon but is not well assessed. No lobar level consolidative changes. Suggestion of LEFT-sided effusion on lateral radiograph and basilar opacity. On limited assessment no acute skeletal process. IMPRESSION: 1. Cardiomegaly and central pulmonary vascular engorgement. Correlate with any signs of heart failure. 2. Indeterminate fullness of the hila bilaterally. This may relate to congestive changes at the level of the bilateral hila not well assessed in AP projection. When the patient is able consider PA and lateral chest radiograph on follow-up to exclude hilar nodal enlargement. 3. Potential LEFT effusion and basilar airspace disease. Correlate with any signs of heart failure or pneumonia. Electronically Signed   By: Zetta Bills M.D.   On: 03/09/2021 15:48   IR Fluoro Guide CV Line Right  Result Date: 03/18/2021 INDICATION: Renal failure EXAM: Tunneled right IJ hemodialysis catheter placement MEDICATIONS: 2 g Ancef IV; The antibiotic was administered within an appropriate time interval prior to skin puncture. ANESTHESIA/SEDATION: Moderate (conscious) sedation was employed during this procedure. A total of Versed 1 mg and Fentanyl 25 mcg was administered intravenously. Moderate Sedation Time: 30 minutes. The patient's level of consciousness and vital signs were monitored continuously by radiology nursing throughout the procedure under my direct supervision. FLUOROSCOPY TIME:  Fluoroscopy Time: 0 minutes 54 seconds (6 mGy). COMPLICATIONS: None immediate. PROCEDURE: Informed  written consent was obtained from the patient after a thorough discussion of the procedural risks, benefits and alternatives. All questions were addressed. Maximal Sterile Barrier Technique was utilized including caps, mask, sterile gowns, sterile gloves, sterile drape, hand hygiene and skin antiseptic. A timeout was performed prior to the initiation of the procedure. The right internal jugular vein was evaluated with ultrasound and shown to be patent. A permanent ultrasound image was obtained and placed in the patient's medical record. Using sterile gel and a sterile probe cover, the right internal jugular vein was entered with a 21 ga needle during real time ultrasound guidance. 0.018 inch guidewire placed and 21 ga needle exchanged for transitional dilator set. Utilizing fluoroscopy, 0.035 inch guidewire advanced through the needle without difficulty. Seriel dilation was performed and peel-away sheath was placed. Attention then turned to the right anterior upper chest. Following local lidocaine administration, the hemodialysis catheter was tunneled from the chest wall to the venotomy site. The catheter was inserted through the peel-away sheath. The tip of the catheter was positioned within the right atrium using fluoroscopic guidance. All lumens of the catheter aspirated and flushed well. The dialysis lumens were locked with Heparin. The catheter was secured to the skin with suture. The insertion site was covered with a Biopatch and sterile dressing. IMPRESSION: 19 cm right IJ tunneled hemodialysis catheter is ready for use. Electronically Signed   By: Miachel Roux M.D.   On: 03/18/2021 11:58   IR US Guide Vasc Access Right  Result Date: 03/18/2021 INDICATION: Renal failure EXAM: Tunneled right IJ hemodialysis catheter placement MEDICATIONS: 2 g Ancef IV; The antibiotic was administered within an appropriate time interval prior to skin puncture. ANESTHESIA/SEDATION: Moderate (conscious) sedation was employed  during this procedure. A total of Versed 1 mg and Fentanyl 25 mcg was administered intravenously. Moderate Sedation Time: 30 minutes. The patient's level of consciousness and vital signs were monitored continuously by radiology nursing throughout the procedure under my direct supervision. FLUOROSCOPY TIME:  Fluoroscopy Time: 0 minutes 54 seconds (6 mGy). COMPLICATIONS: None immediate. PROCEDURE: Informed written consent was obtained from the patient after a thorough discussion of the procedural risks, benefits and alternatives. All questions were addressed. Maximal  Sterile Barrier Technique was utilized including caps, mask, sterile gowns, sterile gloves, sterile drape, hand hygiene and skin antiseptic. A timeout was performed prior to the initiation of the procedure. The right internal jugular vein was evaluated with ultrasound and shown to be patent. A permanent ultrasound image was obtained and placed in the patient's medical record. Using sterile gel and a sterile probe cover, the right internal jugular vein was entered with a 21 ga needle during real time ultrasound guidance. 0.018 inch guidewire placed and 21 ga needle exchanged for transitional dilator set. Utilizing fluoroscopy, 0.035 inch guidewire advanced through the needle without difficulty. Seriel dilation was performed and peel-away sheath was placed. Attention then turned to the right anterior upper chest. Following local lidocaine administration, the hemodialysis catheter was tunneled from the chest wall to the venotomy site. The catheter was inserted through the peel-away sheath. The tip of the catheter was positioned within the right atrium using fluoroscopic guidance. All lumens of the catheter aspirated and flushed well. The dialysis lumens were locked with Heparin. The catheter was secured to the skin with suture. The insertion site was covered with a Biopatch and sterile dressing. IMPRESSION: 19 cm right IJ tunneled hemodialysis catheter is  ready for use. Electronically Signed   By: Miachel Roux M.D.   On: 03/18/2021 11:58   DG CHEST PORT 1 VIEW  Result Date: 03/21/2021 CLINICAL DATA:  Recent aspiration EXAM: PORTABLE CHEST 1 VIEW COMPARISON:  03/10/2021 FINDINGS: Cardiac shadow is enlarged but stable. New right jugular dialysis catheter is noted in satisfactory position. Right lung is clear. Left lung demonstrates some mild basilar opacity increased when compared with the prior exam. No bony abnormality is noted. IMPRESSION: Increasing left basilar airspace opacity consistent with focal infiltrate. Electronically Signed   By: Inez Catalina M.D.   On: 03/21/2021 20:22   ECHOCARDIOGRAM COMPLETE  Result Date: 03/11/2021    ECHOCARDIOGRAM REPORT   Patient Name:   Carolyn Russo [facility] Date of Exam: 03/11/2021 Medical Rec #:  [id number]      Height:       63.0 in Accession #:    [id number]     Weight:       218.4 lb Date of Birth:  01/15/1951      BSA:          2.007 m Patient Age:    11 years       BP:           195/93 mmHg Patient Gender: F              HR:           74 bpm. Exam Location:  Inpatient Procedure: 2D Echo, Cardiac Doppler, Color Doppler and Intracardiac            Opacification Agent Indications:    CHF-Acute Diastolic  History:        Patient has no prior history of Echocardiogram examinations.                 CAD, Signs/Symptoms:Shortness of Breath; Risk Factors:Diabetes,                 Hypertension and Dyslipidemia. CKD.  Sonographer:    Clayton Lefort RDCS (AE) Referring Phys: [id number] [place]  1. Left ventricular ejection fraction, by estimation, is 50%. The left ventricle has low normal function. The left ventricle has no regional wall motion abnormalities. There is mild left ventricular hypertrophy. Left ventricular diastolic parameters are  consistent with Grade II diastolic dysfunction (pseudonormalization). Elevated left ventricular end-diastolic pressure. The E/e' is 29.  2. Right ventricular systolic function is  normal. The right ventricular size is normal. There is moderately elevated pulmonary artery systolic pressure. The estimated right ventricular systolic pressure is 00.1 mmHg.  3. Left atrial size was mildly dilated.  4. The mitral valve is grossly normal. Trivial mitral valve regurgitation. No evidence of mitral stenosis.  5. The aortic valve is grossly normal. There is mild calcification of the aortic valve. Aortic valve regurgitation is not visualized. Mild aortic valve sclerosis is present, with no evidence of aortic valve stenosis.  6. The inferior vena cava is dilated in size with >50% respiratory variability, suggesting right atrial pressure of 8 mmHg. FINDINGS  Left Ventricle: Left ventricular ejection fraction, by estimation, is 50%. The left ventricle has low normal function. The left ventricle has no regional wall motion abnormalities. Definity contrast agent was given IV to delineate the left ventricular endocardial borders. The left ventricular internal cavity size was normal in size. There is mild left ventricular hypertrophy. Left ventricular diastolic parameters are consistent with Grade II diastolic dysfunction (pseudonormalization). Elevated left ventricular end-diastolic pressure. The E/e' is 19. Right Ventricle: The right ventricular size is normal. No increase in right ventricular wall thickness. Right ventricular systolic function is normal. There is moderately elevated pulmonary artery systolic pressure. The tricuspid regurgitant velocity is 3.37 m/s, and with an assumed right atrial pressure of 8 mmHg, the estimated right ventricular systolic pressure is 74.9 mmHg. Left Atrium: Left atrial size was mildly dilated. Right Atrium: Right atrial size was normal in size. Pericardium: There is no evidence of pericardial effusion. Mitral Valve: The mitral valve is grossly normal. Trivial mitral valve regurgitation. No evidence of mitral valve stenosis. MV peak gradient, 5.8 mmHg. The mean mitral valve  gradient is 3.0 mmHg. Tricuspid Valve: The tricuspid valve is normal in structure. Tricuspid valve regurgitation is mild . No evidence of tricuspid stenosis. Aortic Valve: The aortic valve is grossly normal. There is mild calcification of the aortic valve. Aortic valve regurgitation is not visualized. Mild aortic valve sclerosis is present, with no evidence of aortic valve stenosis. Aortic valve mean gradient  measures 5.0 mmHg. Aortic valve peak gradient measures 9.7 mmHg. Aortic valve area, by VTI measures 1.55 cm. Pulmonic Valve: The pulmonic valve was grossly normal. Pulmonic valve regurgitation is trivial. No evidence of pulmonic stenosis. Aorta: The aortic root is normal in size and structure. Venous: The inferior vena cava is dilated in size with greater than 50% respiratory variability, suggesting right atrial pressure of 8 mmHg. IAS/Shunts: No atrial level shunt detected by color flow Doppler.  LEFT VENTRICLE PLAX 2D LVIDd:         4.90 cm  Diastology LVIDs:         3.40 cm  LV e' medial:    3.81 cm/s LV PW:         1.15 cm  LV E/e' medial:  27.3 LV IVS:        1.24 cm  LV e' lateral:   5.66 cm/s LVOT diam:     1.80 cm  LV E/e' lateral: 18.4 LV SV:         48 LV SV Index:   24 LVOT Area:     2.54 cm  RIGHT VENTRICLE             IVC RV Basal diam:  2.80 cm     IVC diam: 2.50 cm  RV S prime:     12.30 cm/s TAPSE (M-mode): 1.4 cm LEFT ATRIUM             Index       RIGHT ATRIUM           Index LA diam:        4.20 cm 2.09 cm/m  RA Area:     13.30 cm LA Vol (A2C):   68.0 ml 33.88 ml/m RA Volume:   30.70 ml  15.29 ml/m LA Vol (A4C):   73.4 ml 36.57 ml/m LA Biplane Vol: 72.5 ml 36.12 ml/m  AORTIC VALVE AV Area (Vmax):    1.61 cm AV Area (Vmean):   1.61 cm AV Area (VTI):     1.55 cm AV Vmax:           156.00 cm/s AV Vmean:          107.000 cm/s AV VTI:            0.311 m AV Peak Grad:      9.7 mmHg AV Mean Grad:      5.0 mmHg LVOT Vmax:         98.60 cm/s LVOT Vmean:        67.500 cm/s LVOT VTI:           0.189 m LVOT/AV VTI ratio: 0.61  AORTA Ao Root diam: 2.70 cm Ao Asc diam:  2.80 cm MITRAL VALVE                TRICUSPID VALVE MV Area (PHT): 3.91 cm     TR Peak grad:   45.4 mmHg MV Area VTI:   1.67 cm     TR Vmax:        337.00 cm/s MV Peak grad:  5.8 mmHg MV Mean grad:  3.0 mmHg     SHUNTS MV Vmax:       1.20 m/s     Systemic VTI:  0.19 m MV Vmean:      75.0 cm/s    Systemic Diam: 1.80 cm MV Decel Time: 194 msec MV E velocity: 104.00 cm/s MV A velocity: 113.00 cm/s MV E/A ratio:  0.92 Cherlynn Kaiser MD Electronically signed by Cherlynn Kaiser MD Signature Date/Time: 03/11/2021/3:49:11 PM    Final    VAS [place] UPPER EXT VEIN MAPPING (PRE-OP AVF)  Result Date: 03/12/2021 UPPER EXTREMITY VEIN MAPPING Patient Name:  Carolyn Russo [facility]  Date of Exam:   03/12/2021 Medical Rec #: [id number]       Accession #:    [id number] Date of Birth: 1950/12/18       Patient Gender: F Patient Age:   70Y Exam Location:  [facility] Procedure:      VAS [place] UPPER EXT VEIN MAPPING (PRE-OP AVF) Referring Phys: [place] --------------------------------------------------------------------------------  Indications: Pre-access. Limitations: [place], movement Comparison Study: No previous exam Performing Technologist: Rogelia Rohrer  Examination Guidelines: A complete evaluation includes B-mode imaging, spectral Doppler, color Doppler, and power Doppler as needed of all accessible portions of each vessel. Bilateral testing is considered an integral part of a complete examination. Limited examinations for reoccurring indications may be performed as noted. +-----------------+-------------+----------+--------------+ Right Cephalic   Diameter (cm)Depth (cm)   Findings    +-----------------+-------------+----------+--------------+ Shoulder             0.36        1.10                  +-----------------+-------------+----------+--------------+  Prox upper arm       0.25        1.46                   +-----------------+-------------+----------+--------------+ Mid upper arm        0.21        0.77                  +-----------------+-------------+----------+--------------+ Dist upper arm       0.20        0.40                  +-----------------+-------------+----------+--------------+ Antecubital fossa    0.16        0.56                  +-----------------+-------------+----------+--------------+ Prox forearm         0.11        0.27     branching    +-----------------+-------------+----------+--------------+ Mid forearm                             not visualized +-----------------+-------------+----------+--------------+ Dist forearm         0.06        0.22                  +-----------------+-------------+----------+--------------+ Wrist                                   not visualized +-----------------+-------------+----------+--------------+ +-----------------+-------------+----------+--------------+ Right Basilic    Diameter (cm)Depth (cm)   Findings    +-----------------+-------------+----------+--------------+ Prox upper arm       0.50                              +-----------------+-------------+----------+--------------+ Mid upper arm        0.29                              +-----------------+-------------+----------+--------------+ Dist upper arm       0.35                              +-----------------+-------------+----------+--------------+ Antecubital fossa    0.29                 branching    +-----------------+-------------+----------+--------------+ Prox forearm         0.07                              +-----------------+-------------+----------+--------------+ Mid forearm          0.07                              +-----------------+-------------+----------+--------------+ Distal forearm       0.05                              +-----------------+-------------+----------+--------------+ Wrist  not visualized +-----------------+-------------+----------+--------------+ +-----------------+-------------+----------+---------+ Left Cephalic    Diameter (cm)Depth (cm)Findings  +-----------------+-------------+----------+---------+ Shoulder             0.20        1.09             +-----------------+-------------+----------+---------+ Prox upper arm       0.14        0.52             +-----------------+-------------+----------+---------+ Mid upper arm        0.22        0.50             +-----------------+-------------+----------+---------+ Dist upper arm       0.16        0.36             +-----------------+-------------+----------+---------+ Antecubital fossa    0.16        0.65             +-----------------+-------------+----------+---------+ Prox forearm         0.12        0.19   branching +-----------------+-------------+----------+---------+ Mid forearm          0.09        0.34             +-----------------+-------------+----------+---------+ Dist forearm         0.06        0.24             +-----------------+-------------+----------+---------+ Wrist                0.05        0.28             +-----------------+-------------+----------+---------+ +-----------------+-------------+----------+--------------+ Left Basilic     Diameter (cm)Depth (cm)   Findings    +-----------------+-------------+----------+--------------+ Prox upper arm       0.42                              +-----------------+-------------+----------+--------------+ Mid upper arm        0.25                              +-----------------+-------------+----------+--------------+ Dist upper arm       0.22                              +-----------------+-------------+----------+--------------+ Antecubital fossa    0.13                 branching    +-----------------+-------------+----------+--------------+ Prox forearm         0.15                               +-----------------+-------------+----------+--------------+ Mid forearm          0.08                              +-----------------+-------------+----------+--------------+ Distal forearm       0.09                              +-----------------+-------------+----------+--------------+ Wrist  not visualized +-----------------+-------------+----------+--------------+ *See table(s) above for measurements and observations.  Diagnosing physician: Deitra Mayo MD Electronically signed by Deitra Mayo MD on 03/12/2021 at 8:15:20 PM.    Final     (Echo, Carotid, EGD, Colonoscopy, ERCP)    Subjective: No complaints.  Discharge Exam: Vitals:   03/24/21 1145 03/24/21 1151  BP: (!) 150/75 139/69  Pulse: 71 70  Resp: 18   Temp:    SpO2:     Vitals:   03/24/21 1130 03/24/21 1145 03/24/21 1151 03/24/21 1210  BP: (!) 148/74 (!) 150/75 139/69   Pulse: 69 71 70   Resp:  18    Temp:      TempSrc:      SpO2:      Weight:    87.8 kg  Height:        General: Pt is alert, awake, not in acute distress Cardiovascular: RRR, S1/S2 +, no rubs, no gallops Respiratory: CTA bilaterally, no wheezing, no rhonchi Abdominal: Soft, NT, ND, bowel sounds + Extremities: no edema, no cyanosis    The results of significant diagnostics from this hospitalization (including imaging, microbiology, ancillary and laboratory) are listed below for reference.     Microbiology: No results found for this or any previous visit (from the past 240 hour(s)).   Labs: BNP (last 3 results) Recent Labs    05/19/20 1212 03/09/21 1435  BNP 104.4* 607.3*   Basic Metabolic Panel: Recent Labs  Lab 03/20/21 0224 03/21/21 0609 03/22/21 0211 03/23/21 0348 03/24/21 0311  NA 139 136 137 138 135  K 3.9 3.8 3.7 4.1 4.3  CL 102 100 102 100 99  CO2 27 29 28 29 27   GLUCOSE 113* 139* 106* 155* 173*  BUN 34* 19 25* 13 24*  CREATININE 3.94* 3.45*  4.48* 3.13* 4.57*  CALCIUM 8.3* 8.2* 8.7* 8.4* 8.6*  PHOS 4.1 3.4 3.8 3.0 3.7   Liver Function Tests: Recent Labs  Lab 03/20/21 0224 03/21/21 0609 03/22/21 0211 03/23/21 0348 03/24/21 0311  ALBUMIN 2.7* 2.6* 2.8* 2.7* 2.7*   No results for input(s): LIPASE, AMYLASE in the last 168 hours. No results for input(s): AMMONIA in the last 168 hours. CBC: Recent Labs  Lab 03/18/21 0322 03/19/21 0752 03/20/21 0224 03/22/21 0211 03/23/21 0348 03/24/21 0818  WBC 10.8* 10.2 8.6 12.2* 11.8* 12.9*  NEUTROABS 7.0  --  4.2 6.1 5.9  --   HGB 7.6* 7.4* 7.9* 8.1* 8.1* 8.0*  HCT 22.9* 22.3* 24.1* 24.9* 25.0* 25.4*  MCV 85.4 85.4 86.1 87.4 87.7 88.8  PLT 396 365 356 331 298 310   Cardiac Enzymes: No results for input(s): CKTOTAL, CKMB, CKMBINDEX, TROPONINI in the last 168 hours. BNP: Invalid input(s): POCBNP CBG: Recent Labs  Lab 03/23/21 0554 03/23/21 1156 03/23/21 1620 03/23/21 2159 03/24/21 0635  GLUCAP 129* 201* 219* 164* 137*   D-Dimer No results for input(s): DDIMER in the last 72 hours. Hgb A1c No results for input(s): HGBA1C in the last 72 hours. Lipid Profile No results for input(s): CHOL, HDL, LDLCALC, TRIG, CHOLHDL, LDLDIRECT in the last 72 hours. Thyroid function studies No results for input(s): TSH, T4TOTAL, T3FREE, THYROIDAB in the last 72 hours.  Invalid input(s): FREET3 Anemia work up No results for input(s): VITAMINB12, FOLATE, FERRITIN, TIBC, IRON, RETICCTPCT in the last 72 hours. Urinalysis    Component Value Date/Time   COLORURINE YELLOW 03/09/2021 2156   APPEARANCEUR HAZY (A) 03/09/2021 2156   LABSPEC 1.015 03/09/2021 2156   PHURINE 6.0 03/09/2021 2156   GLUCOSEU >=  500 (A) 03/09/2021 2156   HGBUR SMALL (A) 03/09/2021 2156   BILIRUBINUR NEGATIVE 03/09/2021 2156   BILIRUBINUR negative 06/18/2019 1409   KETONESUR NEGATIVE 03/09/2021 2156   PROTEINUR >=300 (A) 03/09/2021 2156   UROBILINOGEN 0.2 06/18/2019 1409   NITRITE NEGATIVE 03/09/2021 2156    LEUKOCYTESUR NEGATIVE 03/09/2021 2156   Sepsis Labs Invalid input(s): PROCALCITONIN,  WBC,  LACTICIDVEN Microbiology No results found for this or any previous visit (from the past 240 hour(s)).   Time coordinating discharge: Over 30 minutes  SIGNED:   Charlynne Cousins, MD  [facility] 03/24/2021, 12:31 PM Pager   If 7PM-7AM, please contact night-coverage [email or web address] Password [email or web address]

## 2021-03-24 NOTE — Progress Notes (Signed)
Renal Navigator received call back from patient's husband from message left yesterday. Navigator updated him on outpatient HD plan since patient is still hospitalized. Husband appreciative. Husband called right back and asked about catheter care. Navigator explained that the catheter should not get wet and that dressing changes will happen at dialysis. Navigator spoke with Dialysis Coordinator RN to ask that she speak with husband to provide further education.  Alphonzo Cruise, [place] Renal Navigator [phone]

## 2021-03-24 NOTE — TOC Transition Note (Signed)
Transition of Care [facility]) - CM/SW Discharge Note   Patient Details  Name: Carolyn Russo MRN: [id number] Date of Birth: Jul 24, 1951  [facility]) CM/SW Contact:  Sharin Mons, RN Phone Number: 03/24/2021, 2:04 PM   Clinical Narrative:    Patient will DC to: home Anticipated DC date: 03/24/2021 Family notified: yes, husband Transport by: car  Admitted with SOB and bilateral LE edema, found to have acute on chronic diastolic CHF. New HD pt / ESRD. Active with [facility] embedded practice. Pt with cane and rolator. NCM spoke with pt @ bedside regarding d/c plan. Pt states husband to assist with care once d/c to home. NCM shared PT evaluation. Pt agreeable to home health services. Pt without preference. Referral made with [facility] and accepted. Per MD patient ready for DC today. RN, patient, and  patient's family aware of DC.   Post hospital f/u appointments noted on AVS. Pt without Rx meds concerns. Husband to provide transportationto home.  RNCM will sign off for now as intervention is no longer needed. Please consult [place] again if new needs arise.    Final next level of care: [place]     Patient Goals and CMS Choice Patient states their goals for this hospitalization and ongoing recovery are:: to get home and get better   Choice offered to / list presented to : Patient  Discharge Placement                       Discharge Plan and Services                          HH Arranged: PT,Nurse's Aide [place]: [place] Date [place]: 03/24/21 Time [place]: 1403 Representative spoke with at [place]: [place] ([place]) Interventions     Readmission Risk Interventions No flowsheet data found.

## 2021-03-24 NOTE — Plan of Care (Signed)

## 2021-03-24 NOTE — Consult Note (Signed)
   [facility] CM Inpatient Consult   03/24/2021  Carolyn Russo 05/27/1951 [id number]  Follow up:  Medicare CMS DCE Primary Care Provider:  Dr. [name], [facility]  Met with patient at the bedside regarding Chronic Care Management.  Explained that her provider office has an embedded care management team available for chronic care management.  Patient is interested and will ask Embedded team to follow up.  Plan:  Will refer patient for chronic care management needs.  Natividad Brood, RN BSN [place] [facility] Liaison  [phone] business mobile phone Toll free office [phone]  Fax number: [phone] [email or web address]

## 2021-03-24 NOTE — Progress Notes (Signed)
D/C information given and reviewed. Pt awaiting family to transport home.

## 2021-03-24 NOTE — Progress Notes (Signed)
Pt finished dialysis, removed 2.5 L Tolerated well BP softened while having snack and drink, became sleepy, recovered quickly. Report provided to Mr Margretta Sidle, RN, pt transported back on O2 & telemetry

## 2021-03-25 ENCOUNTER — Telehealth: Payer: Self-pay | Admitting: Physician Assistant

## 2021-03-25 ENCOUNTER — Telehealth: Payer: Self-pay

## 2021-03-25 NOTE — Telephone Encounter (Signed)
Transition of care contact from inpatient facility  Date of discharge: 03/24/21 Date of contact:  03/25/21 Method: Phone Spoke to: Patient's husband  Patient contacted to discuss transition of care from recent inpatient hospitalization. Patient was admitted to [facility] from 03/09/21-03/25/21 with discharge diagnosis of ESRD. Spoke with patient's husband, he reports patient is sleeping right now.   Medication changes were reviewed.  Reminded that patient needs to complete paperwork at dialysis center today. Patient's husband reports they will try but she is not feeling well today. Provided number for outpatient HD unit to discuss completing paperwork. Advised to return to ED if patient does not improve.   Anice Paganini, PA-C 03/25/2021, 12:53 PM  [employer]

## 2021-03-25 NOTE — Telephone Encounter (Signed)
Transition Care Management Unsuccessful Follow-up Telephone Call  Date of discharge and from where:  03/24/2021 [facility]  Attempts:  1st Attempt  Reason for unsuccessful TCM follow-up call:  Unable to leave message

## 2021-03-26 ENCOUNTER — Telehealth: Payer: Self-pay | Admitting: *Deleted

## 2021-03-26 DIAGNOSIS — N2581 Secondary hyperparathyroidism of renal origin: Secondary | ICD-10-CM | POA: Diagnosis not present

## 2021-03-26 DIAGNOSIS — D631 Anemia in chronic kidney disease: Secondary | ICD-10-CM | POA: Diagnosis not present

## 2021-03-26 DIAGNOSIS — Z992 Dependence on renal dialysis: Secondary | ICD-10-CM | POA: Diagnosis not present

## 2021-03-26 DIAGNOSIS — N186 End stage renal disease: Secondary | ICD-10-CM | POA: Diagnosis not present

## 2021-03-26 NOTE — Chronic Care Management (AMB) (Signed)
  Chronic Care Management   Note  03/26/2021 Name: Carolyn Russo MRN: [id number] DOB: 12-24-1950  Carolyn Russo is a 70 y.o. year old female who is a primary care patient of [name], MD. I reached out to Lyndee Hensen by phone today in response to a referral sent by Carolyn Russo's PCP, Dr. Baird Cancer.      Carolyn Russo was given information about Chronic Care Management services today including:  1. CCM service includes personalized support from designated clinical staff supervised by her physician, including individualized plan of care and coordination with other care providers 2. 24/7 contact phone numbers for assistance for urgent and routine care needs. 3. Service will only be billed when office clinical staff spend 20 minutes or more in a month to coordinate care. 4. Only one practitioner may furnish and bill the service in a calendar month. 5. The patient may stop CCM services at any time (effective at the end of the month) by phone call to the office staff. 6. The patient will be responsible for cost sharing (co-pay) of up to 20% of the service fee (after annual deductible is met).  Spouse Dr. [name] DPR on file  verbally agreed to assistance and services provided by embedded care coordination/care management team today.  Follow up plan: Telephone appointment with care management team member scheduled for:03/30/2021  [place] Management

## 2021-03-29 ENCOUNTER — Other Ambulatory Visit: Payer: Self-pay

## 2021-03-29 ENCOUNTER — Telehealth: Payer: Self-pay

## 2021-03-29 DIAGNOSIS — N2581 Secondary hyperparathyroidism of renal origin: Secondary | ICD-10-CM | POA: Diagnosis not present

## 2021-03-29 DIAGNOSIS — D631 Anemia in chronic kidney disease: Secondary | ICD-10-CM | POA: Diagnosis not present

## 2021-03-29 DIAGNOSIS — N186 End stage renal disease: Secondary | ICD-10-CM | POA: Diagnosis not present

## 2021-03-29 DIAGNOSIS — Z992 Dependence on renal dialysis: Secondary | ICD-10-CM | POA: Diagnosis not present

## 2021-03-29 MED ORDER — ONDANSETRON HCL 4 MG PO TABS: 4.0000 mg | ORAL_TABLET | Freq: Four times a day (QID) | ORAL | 1 refills | Status: DC | PRN

## 2021-03-29 NOTE — Telephone Encounter (Signed)
Transition Care Management Follow-up Telephone Call  Date of discharge and from where: 03/25/2021  How have you been since you were released from the hospital? SOB  Any questions or concerns? No  Items Reviewed:  Did the pt receive and understand the discharge instructions provided? Yes   Medications obtained and verified? Yes   Other? No   Any new allergies since your discharge? No   Dietary orders reviewed? No  Do you have support at home? Yes   Home Care and Equipment/Supplies: Were home health services ordered? yes If so, what is the name of the agency? [facility]  Has the agency set up a time to come to the patient's home? yes Were any new equipment or medical supplies ordered?  No What is the name of the medical supply agency? [facility] Were you able to get the supplies/equipment? not applicable Do you have any questions related to the use of the equipment or supplies? No  Functional Questionnaire: (I = Independent and D = Dependent) ADLs: d  Bathing/Dressing- d  Meal Prep- d  Eating- i  Maintaining continence- i  Transferring/Ambulation- i  Managing Meds- d  Follow up appointments reviewed:   PCP Hospital f/u appt confirmed? Yes  Scheduled to see Raman  on June 9 .  [facility] f/u appt confirmed? Yes  .  Are transportation arrangements needed? No   If their condition worsens, is the pt aware to call PCP or go to the Emergency Dept.? Yes  Was the patient provided with contact information for the PCP's office or ED? Yes  Was to pt encouraged to call back with questions or concerns? Yes

## 2021-03-30 ENCOUNTER — Ambulatory Visit: Payer: Self-pay

## 2021-03-30 ENCOUNTER — Telehealth: Payer: Medicare Other

## 2021-03-30 ENCOUNTER — Ambulatory Visit: Payer: Medicare Other

## 2021-03-30 DIAGNOSIS — N1832 Chronic kidney disease, stage 3b: Secondary | ICD-10-CM

## 2021-03-30 DIAGNOSIS — N185 Chronic kidney disease, stage 5: Secondary | ICD-10-CM

## 2021-03-30 DIAGNOSIS — Z9181 History of falling: Secondary | ICD-10-CM | POA: Diagnosis not present

## 2021-03-30 DIAGNOSIS — E785 Hyperlipidemia, unspecified: Secondary | ICD-10-CM | POA: Diagnosis not present

## 2021-03-30 DIAGNOSIS — R0600 Dyspnea, unspecified: Secondary | ICD-10-CM

## 2021-03-30 DIAGNOSIS — J45909 Unspecified asthma, uncomplicated: Secondary | ICD-10-CM | POA: Diagnosis not present

## 2021-03-30 DIAGNOSIS — J690 Pneumonitis due to inhalation of food and vomit: Secondary | ICD-10-CM | POA: Diagnosis not present

## 2021-03-30 DIAGNOSIS — J810 Acute pulmonary edema: Secondary | ICD-10-CM

## 2021-03-30 DIAGNOSIS — E1122 Type 2 diabetes mellitus with diabetic chronic kidney disease: Secondary | ICD-10-CM | POA: Diagnosis not present

## 2021-03-30 DIAGNOSIS — I5033 Acute on chronic diastolic (congestive) heart failure: Secondary | ICD-10-CM

## 2021-03-30 DIAGNOSIS — Z79891 Long term (current) use of opiate analgesic: Secondary | ICD-10-CM | POA: Diagnosis not present

## 2021-03-30 DIAGNOSIS — Z7982 Long term (current) use of aspirin: Secondary | ICD-10-CM | POA: Diagnosis not present

## 2021-03-30 DIAGNOSIS — Z992 Dependence on renal dialysis: Secondary | ICD-10-CM | POA: Diagnosis not present

## 2021-03-30 DIAGNOSIS — D631 Anemia in chronic kidney disease: Secondary | ICD-10-CM | POA: Diagnosis not present

## 2021-03-30 DIAGNOSIS — N186 End stage renal disease: Secondary | ICD-10-CM | POA: Diagnosis not present

## 2021-03-30 DIAGNOSIS — I081 Rheumatic disorders of both mitral and tricuspid valves: Secondary | ICD-10-CM | POA: Diagnosis not present

## 2021-03-30 DIAGNOSIS — E1165 Type 2 diabetes mellitus with hyperglycemia: Secondary | ICD-10-CM

## 2021-03-30 DIAGNOSIS — R0609 Other forms of dyspnea: Secondary | ICD-10-CM

## 2021-03-30 DIAGNOSIS — Z794 Long term (current) use of insulin: Secondary | ICD-10-CM

## 2021-03-30 DIAGNOSIS — I132 Hypertensive heart and chronic kidney disease with heart failure and with stage 5 chronic kidney disease, or end stage renal disease: Secondary | ICD-10-CM | POA: Diagnosis not present

## 2021-03-30 DIAGNOSIS — I2510 Atherosclerotic heart disease of native coronary artery without angina pectoris: Secondary | ICD-10-CM | POA: Diagnosis not present

## 2021-03-30 DIAGNOSIS — I160 Hypertensive urgency: Secondary | ICD-10-CM | POA: Diagnosis not present

## 2021-03-30 NOTE — Patient Instructions (Signed)
Social Worker Visit Information  Goals we discussed today:  Goals Addressed            This Visit's Progress   . Disease Progression Managed       Timeframe:  Long-Range Goal Priority:  High Start Date:  6.7.22                           Expected End Date: 10.5.22                      Next planned outreach: 6.16.22  Patient Goals/Self-Care Activities . patient will:   - Attend upcomming OV scheduled 6/9 -Review resources provided by SW -Engage with RN Care Manager -Participate with home health services -Contact SW as needed prior to next scheduled call        Materials provided: Yes: verbal and written information on resources to assist with transportation and patient care needs  Ms. Benn was given information about Chronic Care Management services today including:  1. CCM service includes personalized support from designated clinical staff supervised by her physician, including individualized plan of care and coordination with other care providers 2. 24/7 contact phone numbers for assistance for urgent and routine care needs. 3. Service will only be billed when office clinical staff spend 20 minutes or more in a month to coordinate care. 4. Only one practitioner may furnish and bill the service in a calendar month. 5. The patient may stop CCM services at any time (effective at the end of the month) by phone call to the office staff. 6. The patient will be responsible for cost sharing (co-pay) of up to 20% of the service fee (after annual deductible is met).  Patient agreed to services and verbal consent obtained.   Patient verbalizes understanding of instructions provided today and agrees to view in [place].   Follow up plan: SW will follow up with patient by phone over the next 10 days   Daneen Schick, BSW, CDP Social Worker, Certified Dementia Practitioner [place] [facility] [place] [facility] [phone]

## 2021-03-30 NOTE — Chronic Care Management (AMB) (Signed)
Chronic Care Management   CCM RN Visit Note  03/30/2021 Name: Carolyn Russo MRN: [id number] DOB: 08/08/1951  Subjective: Carolyn Russo is a 70 y.o. year old female who is a primary care patient of [name], MD. The care management team was consulted for assistance with disease management and care coordination needs.    Collaboration with Daneen Schick BSW  for Case Collaboration  in response to provider referral for case management and/or care coordination services.   Consent to Services:  The patient was given the following information about Chronic Care Management services today, agreed to services, and gave verbal consent: 1. CCM service includes personalized support from designated clinical staff supervised by the primary care provider, including individualized plan of care and coordination with other care providers 2. 24/7 contact phone numbers for assistance for urgent and routine care needs. 3. Service will only be billed when office clinical staff spend 20 minutes or more in a month to coordinate care. 4. Only one practitioner may furnish and bill the service in a calendar month. 5.The patient may stop CCM services at any time (effective at the end of the month) by phone call to the office staff. 6. The patient will be responsible for cost sharing (co-pay) of up to 20% of the service fee (after annual deductible is met). Patient agreed to services and consent obtained.  Patient agreed to services and verbal consent obtained.   Assessment: Review of patient past medical history, allergies, medications, health status, including review of consultants reports, laboratory and other test data, was performed as part of comprehensive evaluation and provision of chronic care management services.   SDOH (Social Determinants of Health) assessments and interventions performed:  yes, see care plan   CCM Care Plan  Allergies  Allergen Reactions  . Food Anaphylaxis    Peanuts; Almonds  .  Other Shortness Of Breath    Peanuts; Almonds  . Wheat Bran Shortness Of Breath  . Statins Itching and Other (See Comments)    Generalized aches  . Iodine Other (See Comments)    The patient denied this allergy to me on 03/16/2021  . Shellfish Allergy Other (See Comments)    Mouth gets raw  . Sitagliptin     Other reaction(s): Unknown  . Tetracycline Other (See Comments)    Raw mouth  . Tetracyclines & Related Itching  . Contrast Media [Iodinated Diagnostic Agents] Rash    Happened during CT scan over 30 years ago    Outpatient Encounter Medications as of 03/30/2021  Medication Sig  . albuterol (PROVENTIL) (2.5 MG/3ML) 0.083% nebulizer solution Take 3 mLs (2.5 mg total) by nebulization every 4 (four) hours as needed for wheezing or shortness of breath.  Marland Kitchen albuterol (VENTOLIN HFA) 108 (90 Base) MCG/ACT inhaler Inhale 2 puffs into the lungs every 4 (four) hours as needed for wheezing or shortness of breath.  Marland Kitchen amoxicillin-clavulanate (AUGMENTIN) 500-125 MG tablet Take 1 tablet (500 mg total) by mouth daily.  Marland Kitchen aspirin 81 MG tablet Take 81 mg by mouth daily.  Marland Kitchen b complex vitamins capsule Take 1 capsule by mouth daily.  Marland Kitchen BESIVANCE 0.6 % SUSP Place 1 drop into both eyes See admin instructions. After  eye injections  . calcitRIOL (ROCALTROL) 0.25 MCG capsule Take 0.25 mcg by mouth 3 (three) times a week.  Gladis Riffle Oil-Levomenthol (FDGARD PO) Take 1 tablet by mouth 2 (two) times daily as needed (nausea).  . carvedilol (COREG) 3.125 MG tablet Take 1 tablet (3.125 mg  total) by mouth 2 (two) times daily with a meal.  . Cholecalciferol (VITAMIN D3 PO) Take 2,000 Units by mouth daily.  . Continuous Blood Gluc Receiver (DEXCOM G6 RECEIVER) DEVI Use as directed to check blood sugars 3 times per day dx: e11.65  . Continuous Blood Gluc Sensor (DEXCOM G6 SENSOR) MISC Use as directed to check blood sugars 3 times per day dx: e11.65  . Continuous Blood Gluc Transmit (DEXCOM G6 TRANSMITTER) MISC Use as  directed to check blood sugars 3 times per day dx: e11.65  . fluticasone (FLONASE) 50 MCG/ACT nasal spray Place 2 sprays into the nose daily as needed for allergies.   . furosemide (LASIX) 40 MG tablet Take 1-2 tablets (40-80 mg total) by mouth every Monday, Wednesday, and Friday. 80 mg twice a week. 40 mg all other days (Patient taking differently: Take 40-80 mg by mouth every Monday, Wednesday, and Friday. 58m Monday Wednesday and Friday.)  . furosemide (LASIX) 80 MG tablet Tuesday Thursday saturday  . insulin aspart protamine - aspart (NOVOLOG MIX 70/30 FLEXPEN) (70-30) 100 UNIT/ML FlexPen Inject 1 mL (100 Units total) into the skin 2 (two) times daily with a meal. 20 units in the morning and 15 units at night  . iron polysaccharides (NIFEREX) 150 MG capsule Take 1 capsule (150 mg total) by mouth daily.  .Marland Kitchenlactobacillus acidophilus (BACID) TABS Take 1 tablet by mouth daily. PROBIOTIC  . magnesium oxide (MAG-OX) 400 (241.3 Mg) MG tablet Take 400 mg by mouth daily.  . mometasone-formoterol (DULERA) 200-5 MCG/ACT AERO Inhale 2 puffs into the lungs 2 (two) times daily. (Patient not taking: Reported on 03/09/2021)  . ondansetron (ZOFRAN) 4 MG tablet Take 1 tablet (4 mg total) by mouth every 6 (six) hours as needed for nausea or vomiting.  .Glory RosebushDelica Lancets 396QMISC Use as directed to check blood sugars before breakfast and dinner dx: e11.65  . ONETOUCH VERIO test strip CHECK BLOOD SUGAR TWICE DAILY AS DIRECTED  . polyethylene glycol (MIRALAX / GLYCOLAX) 17 g packet Take 17 g by mouth daily. (Patient taking differently: Take 17 g by mouth daily as needed for mild constipation.)  . Polyvinyl Alcohol-Povidone (REFRESH OP) Place 1 drop into both eyes daily as needed (dry eyes).  . rosuvastatin (CRESTOR) 20 MG tablet Take 1 tablet (20 mg total) by mouth daily.  . sevelamer carbonate (RENVELA) 800 MG tablet Take 1 tablet (800 mg total) by mouth 3 (three) times daily with meals.  . SYMBICORT 160-4.5  MCG/ACT inhaler Inhale 2 puffs into the lungs 2 (two) times daily.  . traMADol (ULTRAM) 50 MG tablet Take 1 tablet (50 mg total) by mouth every 12 (twelve) hours as needed for severe pain.  . vitamin C (ASCORBIC ACID) 500 MG tablet Take 500 mg by mouth daily.   No facility-administered encounter medications on file as of 03/30/2021.    Patient Active Problem List   Diagnosis Date Noted  . Hypertensive urgency 03/10/2021  . Hilar enlargement   . Symptomatic anemia   . Acute on chronic diastolic CHF (congestive heart failure) ([facility] 03/09/2021  . CKD (chronic kidney disease), stage V ([facility] 03/08/2021  . Diabetic retinopathy ([facility] 03/08/2021  . Hyperglycemia due to type 2 diabetes mellitus ([facility] 03/08/2021  . Pure hypercholesterolemia 03/08/2021  . Anemia in chronic kidney disease 09/24/2020  . Uncontrolled type 2 diabetes mellitus with hyperglycemia ([facility] 03/19/2019  . Osteopenia 09/04/2018  . Migraine 09/04/2018  . Asthma 09/04/2018  . Pseudophakia of both eyes 07/17/2018  .  Pseudophakia of left eye 07/03/2018  . Open angle with borderline findings and high glaucoma risk in left eye 07/03/2018  . Age-related nuclear cataract of right eye 07/03/2018  . CAD (coronary artery disease), native coronary artery 04/27/2017  . Abnormal nuclear stress test 04/04/2017  . Dyspnea on exertion 03/09/2017  . Appendicitis   . Age-related hypermature cataract of both eyes 12/20/2016  . Uterine leiomyoma 11/27/2012  . Dyslipidemia (high LDL; low HDL) 11/25/2011  . Obesity (BMI 30-39.9) 11/25/2011  . Hypertensive disorder 11/21/2011  . Type 2 diabetes mellitus ([place]) 11/21/2011  . Abnormal EKG 11/21/2011    Conditions to be addressed/monitored:Acute pulmonary edema, Acute on chronic diastolic CHF (congestive heart failure), CKD (chronic kidney disease), stage V, Uncontrolled type 2 diabetes mellitus with hyperglycemia   Care Plan : Assist with Chronic Care Management and Care Coordination needs   Updates made by Lynne Logan, RN since 03/30/2021 12:00 AM    Problem: Assist with Chronic Care Management and Care Coordination needs   Priority: High    Goal: Assist with Chronic Care Management and Care Coordination needs   Start Date: 03/30/2021  Expected End Date: 05/21/2021  This Visit's Progress: On track  Priority: High  Note:   Current Barriers:   Chronic Disease Management support, education, chronic care management and care coordination needs related to Acute pulmonary edema, Acute on chronic diastolic CHF (congestive heart failure), CKD (chronic kidney disease), stage V, Uncontrolled type 2 diabetes mellitus with hyperglycemia with RNCM, SW and Pharmacy Care Management and Care coordination needs Case Manager Clinical Goal(s):  Marland Kitchen Patient will work with the CCM team to address needs related to chronic care management and care coordination needs related to Acute pulmonary edema, Acute on chronic diastolic CHF (congestive heart failure), CKD (chronic kidney disease), stage V, Uncontrolled type 2 diabetes mellitus with hyperglycemia with RNCM, SW and Pharmacy Care Management and Care Coordination needs Interventions:  . Collaborated with BSW to initiate plan of care to address needs related to chronic care management and care coordination needs related to Acute pulmonary edema, Acute on chronic diastolic CHF (congestive heart failure), CKD (chronic kidney disease), stage V, Uncontrolled type 2 diabetes mellitus with hyperglycemia with RNCM, SW and Pharmacy Care Management and Care Coordination needs . Collaboration with [name], MD regarding development and update of comprehensive plan of care as evidenced by provider attestation and co-signature  Inter-disciplinary care team collaboration (see longitudinal plan of care) Patient Goals/Self-Care Activities . patient will:   - Patient will work with the CCM team to address chronic care management and care coordination needs and  will continue to work with the clinical team to address health care and disease management related needs.    Follow Up Plan: The care management team will reach out to the patient again over the next 30 days.      Plan:Telephone follow up appointment with care management team member scheduled for:  04/02/21  Barb Merino, RN, BSN, CCM Care Management Coordinator [facility]  Direct Phone: [phone]

## 2021-03-30 NOTE — Chronic Care Management (AMB) (Signed)
Chronic Care Management    Social Work Note  03/30/2021 Name: Carolyn Russo MRN: [id number] DOB: [birth year]  Carolyn Russo is a 70 y.o. year old female who is a primary care patient of [name], MD. The CCM team was consulted to assist the patient with chronic disease management and/or care coordination needs related to: [employer] .   Engaged with patient spouse by phone for initial visit in response to provider referral for social work chronic care management and care coordination services.   Consent to Services:  The patient was given the following information about Chronic Care Management services today, agreed to services, and gave verbal consent: 1. CCM service includes personalized support from designated clinical staff supervised by the primary care provider, including individualized plan of care and coordination with other care providers 2. 24/7 contact phone numbers for assistance for urgent and routine care needs. 3. Service will only be billed when office clinical staff spend 20 minutes or more in a month to coordinate care. 4. Only one practitioner may furnish and bill the service in a calendar month. 5.The patient may stop CCM services at any time (effective at the end of the month) by phone call to the office staff. 6. The patient will be responsible for cost sharing (co-pay) of up to 20% of the service fee (after annual deductible is met). Patient agreed to services and consent obtained.  Patient agreed to services and consent obtained.   Assessment: Review of patient past medical history, allergies, medications, and health status, including review of relevant consultants reports was performed today as part of a comprehensive evaluation and provision of chronic care management and care coordination services.     SDOH (Social Determinants of Health) assessments and interventions performed:  SDOH Interventions   Flowsheet Row Most Recent Value  SDOH Interventions    Food Insecurity Interventions Intervention Not Indicated  Housing Interventions Intervention Not Indicated  Transportation Interventions Intervention Not Indicated       Advanced Directives Status: Not addressed in this encounter.  CCM Care Plan  Allergies  Allergen Reactions  . Food Anaphylaxis    Peanuts; Almonds  . Other Shortness Of Breath    Peanuts; Almonds  . Wheat Bran Shortness Of Breath  . Statins Itching and Other (See Comments)    Generalized aches  . Iodine Other (See Comments)    The patient denied this allergy to me on 03/16/2021  . Shellfish Allergy Other (See Comments)    Mouth gets raw  . Sitagliptin     Other reaction(s): Unknown  . Tetracycline Other (See Comments)    Raw mouth  . Tetracyclines & Related Itching  . Contrast Media [Iodinated Diagnostic Agents] Rash    Happened during CT scan over 30 years ago    Outpatient Encounter Medications as of 03/30/2021  Medication Sig  . albuterol (PROVENTIL) (2.5 MG/3ML) 0.083% nebulizer solution Take 3 mLs (2.5 mg total) by nebulization every 4 (four) hours as needed for wheezing or shortness of breath.  Marland Kitchen albuterol (VENTOLIN HFA) 108 (90 Base) MCG/ACT inhaler Inhale 2 puffs into the lungs every 4 (four) hours as needed for wheezing or shortness of breath.  Marland Kitchen amoxicillin-clavulanate (AUGMENTIN) 500-125 MG tablet Take 1 tablet (500 mg total) by mouth daily.  Marland Kitchen aspirin 81 MG tablet Take 81 mg by mouth daily.  Marland Kitchen b complex vitamins capsule Take 1 capsule by mouth daily.  Marland Kitchen BESIVANCE 0.6 % SUSP Place 1 drop into both eyes See admin instructions.  After  eye injections  . calcitRIOL (ROCALTROL) 0.25 MCG capsule Take 0.25 mcg by mouth 3 (three) times a week.  Carolyn Russo Oil-Levomenthol (FDGARD PO) Take 1 tablet by mouth 2 (two) times daily as needed (nausea).  . carvedilol (COREG) 3.125 MG tablet Take 1 tablet (3.125 mg total) by mouth 2 (two) times daily with a meal.  . Cholecalciferol (VITAMIN D3 PO) Take 2,000 Units  by mouth daily.  . Continuous Blood Gluc Receiver (DEXCOM G6 RECEIVER) DEVI Use as directed to check blood sugars 3 times per day dx: e11.65  . Continuous Blood Gluc Sensor (DEXCOM G6 SENSOR) MISC Use as directed to check blood sugars 3 times per day dx: e11.65  . Continuous Blood Gluc Transmit (DEXCOM G6 TRANSMITTER) MISC Use as directed to check blood sugars 3 times per day dx: e11.65  . fluticasone (FLONASE) 50 MCG/ACT nasal spray Place 2 sprays into the nose daily as needed for allergies.   . furosemide (LASIX) 40 MG tablet Take 1-2 tablets (40-80 mg total) by mouth every Monday, Wednesday, and Friday. 80 mg twice a week. 40 mg all other days (Patient taking differently: Take 40-80 mg by mouth every Monday, Wednesday, and Friday. 71m Monday Wednesday and Friday.)  . furosemide (LASIX) 80 MG tablet Tuesday Thursday saturday  . insulin aspart protamine - aspart (NOVOLOG MIX 70/30 FLEXPEN) (70-30) 100 UNIT/ML FlexPen Inject 1 mL (100 Units total) into the skin 2 (two) times daily with a meal. 20 units in the morning and 15 units at night  . iron polysaccharides (NIFEREX) 150 MG capsule Take 1 capsule (150 mg total) by mouth daily.  .Marland Kitchenlactobacillus acidophilus (BACID) TABS Take 1 tablet by mouth daily. PROBIOTIC  . magnesium oxide (MAG-OX) 400 (241.3 Mg) MG tablet Take 400 mg by mouth daily.  . mometasone-formoterol (DULERA) 200-5 MCG/ACT AERO Inhale 2 puffs into the lungs 2 (two) times daily. (Patient not taking: Reported on 03/09/2021)  . ondansetron (ZOFRAN) 4 MG tablet Take 1 tablet (4 mg total) by mouth every 6 (six) hours as needed for nausea or vomiting.  .Glory RosebushDelica Lancets 360OMISC Use as directed to check blood sugars before breakfast and dinner dx: e11.65  . ONETOUCH VERIO test strip CHECK BLOOD SUGAR TWICE DAILY AS DIRECTED  . polyethylene glycol (MIRALAX / GLYCOLAX) 17 g packet Take 17 g by mouth daily. (Patient taking differently: Take 17 g by mouth daily as needed for mild  constipation.)  . Polyvinyl Alcohol-Povidone (REFRESH OP) Place 1 drop into both eyes daily as needed (dry eyes).  . rosuvastatin (CRESTOR) 20 MG tablet Take 1 tablet (20 mg total) by mouth daily.  . sevelamer carbonate (RENVELA) 800 MG tablet Take 1 tablet (800 mg total) by mouth 3 (three) times daily with meals.  . SYMBICORT 160-4.5 MCG/ACT inhaler Inhale 2 puffs into the lungs 2 (two) times daily.  . traMADol (ULTRAM) 50 MG tablet Take 1 tablet (50 mg total) by mouth every 12 (twelve) hours as needed for severe pain.  . vitamin C (ASCORBIC ACID) 500 MG tablet Take 500 mg by mouth daily.   No facility-administered encounter medications on file as of 03/30/2021.    Patient Active Problem List   Diagnosis Date Noted  . Hypertensive urgency 03/10/2021  . Hilar enlargement   . Symptomatic anemia   . Acute on chronic diastolic CHF (congestive heart failure) ([facility] 03/09/2021  . CKD (chronic kidney disease), stage V ([facility] 03/08/2021  . Diabetic retinopathy ([facility] 03/08/2021  . Hyperglycemia due  to type 2 diabetes mellitus ([place]) 03/08/2021  . Pure hypercholesterolemia 03/08/2021  . Anemia in chronic kidney disease 09/24/2020  . Uncontrolled type 2 diabetes mellitus with hyperglycemia ([place]) 03/19/2019  . Osteopenia 09/04/2018  . Migraine 09/04/2018  . Asthma 09/04/2018  . Pseudophakia of both eyes 07/17/2018  . Pseudophakia of left eye 07/03/2018  . Open angle with borderline findings and high glaucoma risk in left eye 07/03/2018  . Age-related nuclear cataract of right eye 07/03/2018  . CAD (coronary artery disease), native coronary artery 04/27/2017  . Abnormal nuclear stress test 04/04/2017  . Dyspnea on exertion 03/09/2017  . Appendicitis   . Age-related hypermature cataract of both eyes 12/20/2016  . Uterine leiomyoma 11/27/2012  . Dyslipidemia (high LDL; low HDL) 11/25/2011  . Obesity (BMI 30-39.9) 11/25/2011  . Hypertensive disorder 11/21/2011  . Type 2 diabetes mellitus ([place])  11/21/2011  . Abnormal EKG 11/21/2011    Conditions to be addressed/monitored: Acute Pulmonary Edema, Acute on Chronic Diastolic CHF, CKD Stage V, and DM II; Transportation and Limited access to caregiver  Care Plan : Social Work [facility] Plan of Care  Updates made by Daneen Schick since 03/30/2021 12:00 AM    Problem: Disease Progression     Long-Range Goal: Disease Progression Managed   Start Date: 03/30/2021  Expected End Date: 07/28/2021  Priority: High  Note:   Current Barriers:  . Chronic disease management support and education needs related to  Acute Pulmonary Edema, Acute on Chronic Diastolic CHF, CKD Stage V, and DM II   . Recent 15 day inpatient day . Limited knowledge of resources to assist with patient care needs . ADL/iADL limitations  Social Worker Clinical Goal(s):  . patient will work with SW to identify and address any acute and/or chronic care coordination needs related to the self health management of  Acute Pulmonary Edema, Acute on Chronic Diastolic CHF, CKD Stage V, DM II   . Patient will work with SW to become more knowledgeable of transportation resources to assist with transportation home from Dialysis . Patient will work with SW to identify caregiver resources to assist with patient care needs . Patient will engage with RN Care Manager to develop an individualized plan of care to address clinical care management needs  SW Interventions:  . Inter-disciplinary care team collaboration (see longitudinal plan of care) . Collaboration with [name], MD regarding development and update of comprehensive plan of care as evidenced by provider attestation and co-signature . Successful outbound call placed to the patients spouse to follow up on referral for care management program . Performed [facility] screen - no acute challenges noted at this time . Performed chart review to note patient had an inpatient stay from 5/17-6/1 due to Acute Pulmonary Edema . Mr. Keltz  reports that while patient was hospitalized he witnessed her aspirate during a meal and she was discharged with oral antibiotics . Discussed the patient attended her first outpatient HD appointment on 6/3 - Mr. Storer reports the patient was weak following first treatment. Her second treatment was 6/6 and she did not feel as bad after that treatment . Performed chart review to note patient to attend dialysis at [facility] Monday, Wednesday, and Friday. Her seat time is 5:40 am . Assessed for transportation barriers to/from dialysis - Mr. Mohon reports that at this time he is providing transportation. He may be interested in transportation resources to transport patient home in case he is working when she is ready to  leave . Provided verbal and written education on [facility] Access (SC[facility])  . Discussed the patient may not want to utilize this service due to the ride share program  . Advised Mr. Perdomo other options would include Uber/Lyft or a taxi - discussed [employer] employees are trained in [place] compliance and will assist the patient to the door if needed . Mr. Roedel will review information with the patient and contact SW if they would like to proceed with application . Discussed the patient does need assistance with bathing, dressing, and toileting . Advised Mr. Dentremont the patients health plan does not cover custodial care in the home but he is able to privately hire a caregiver . Provided Mr. Wires with contact numbers to [facility], [place], and Home Care Providers . Discussed the patient is experiencing shortness of breath at night where she will wake up stating it is hot in the room and she needs air . Patient is currently sleeping propped up - Mr. Hidrogo will administer her nebulizer through the night to assist with SOB . Determined the patient was not discharged home from the hospital with order for oxygen but did use oxygen during her  stay . Mr. Mcdougall reports he was contacted by Charisse Klinefelter [phone]) on 03/29/21 to discuss oxygen products for the patient. He reports the representative spoke very fast and he had a hard time keeping up . Performed chart review, unable to locate any new orders to DME for oxygen. Noted through Google search that [employer] is a brand of Oxygen concentrator not a local DME . Advised Mr. Basu to discuss concerns for in home Oxygen on upcomming OV scheduled for 6.9.22 - Mr. Grimsley stated understanding . Noted patient discharge instructions indicate orders for home health PT and NA . Confirmed patient did begin PT today . Discussed plans to work with SW while to address resource needs while patient engages with  RN Case Manager  to address care management needs . Scheduled follow up call over the next 10 days . Collaboration with [place] to discuss enrollment status, interventions, and plan  Patient Goals/Self-Care Activities . patient will:   -  Attend upcomming OV scheduled 6/9 -Review resources provided by SW -Engage with RN Care Manager -Participate with home health services -Contact SW as needed prior to next scheduled call  Follow Up Plan:  SW will contact the patient over the next 10 days       Follow Up Plan: SW will follow up with patient by phone over the next 10 days.      Daneen Schick, BSW, CDP Social Worker, Certified Dementia Practitioner [place] [facility] [phone]  Total time spent performing care coordination and/or care management activities with the patient by phone or face to face = 95 minutes.

## 2021-03-31 DIAGNOSIS — D631 Anemia in chronic kidney disease: Secondary | ICD-10-CM | POA: Diagnosis not present

## 2021-03-31 DIAGNOSIS — N2581 Secondary hyperparathyroidism of renal origin: Secondary | ICD-10-CM | POA: Diagnosis not present

## 2021-03-31 DIAGNOSIS — N186 End stage renal disease: Secondary | ICD-10-CM | POA: Diagnosis not present

## 2021-03-31 DIAGNOSIS — Z992 Dependence on renal dialysis: Secondary | ICD-10-CM | POA: Diagnosis not present

## 2021-04-01 ENCOUNTER — Ambulatory Visit (INDEPENDENT_AMBULATORY_CARE_PROVIDER_SITE_OTHER): Payer: Medicare Other | Admitting: Nurse Practitioner

## 2021-04-01 ENCOUNTER — Other Ambulatory Visit: Payer: Self-pay

## 2021-04-01 VITALS — BP 160/82 | HR 78 | Temp 98.3°F | Ht 61.6 in | Wt 189.8 lb

## 2021-04-01 DIAGNOSIS — I151 Hypertension secondary to other renal disorders: Secondary | ICD-10-CM

## 2021-04-01 DIAGNOSIS — E1165 Type 2 diabetes mellitus with hyperglycemia: Secondary | ICD-10-CM

## 2021-04-01 DIAGNOSIS — I5033 Acute on chronic diastolic (congestive) heart failure: Secondary | ICD-10-CM | POA: Diagnosis not present

## 2021-04-01 DIAGNOSIS — Z992 Dependence on renal dialysis: Secondary | ICD-10-CM | POA: Diagnosis not present

## 2021-04-01 DIAGNOSIS — E1122 Type 2 diabetes mellitus with diabetic chronic kidney disease: Secondary | ICD-10-CM | POA: Diagnosis not present

## 2021-04-01 DIAGNOSIS — R0602 Shortness of breath: Secondary | ICD-10-CM | POA: Diagnosis not present

## 2021-04-01 DIAGNOSIS — N2889 Other specified disorders of kidney and ureter: Secondary | ICD-10-CM

## 2021-04-01 DIAGNOSIS — N185 Chronic kidney disease, stage 5: Secondary | ICD-10-CM | POA: Diagnosis not present

## 2021-04-01 NOTE — Progress Notes (Signed)
This visit occurred during the SARS-CoV-2 public health emergency.  Safety protocols were in place, including screening questions prior to the visit, additional usage of staff PPE, and extensive cleaning of exam room while observing appropriate contact time as indicated for disinfecting solutions.  Subjective:     Patient ID: Carolyn Russo , female    DOB: 1951/08/14 , 70 y.o.   MRN: [id number]   Chief Complaint  Patient presents with   Shortness of Breath    HPI  She is here today for a follow up on her recent hospital admission for shortness of breath. She was recently in the hospital with weakness and shortness of breath. Upon discharge from the hospital she is still feeling weak and has shortness of breath. She states that it is very hard for her to lie down flat at night because she has a very hard time breathing. She gets short of breath at night and also when she walks. She was not discharged from the hospital with oxygen and would like to see if she qualifies due to her having a hard time at night breathing.     Past Medical History:  Diagnosis Date   Allergy    Anterior chest wall pain    Appendicitis 1965   Asthma    Body mass index 37.0-37.9, adult    Breast pain    Cataract    both eyes   Chronic renal disease, stage II    Dehydration 2014   Deviated septum 1971   Diabetes mellitus    Dyspnea 2014   Extrinsic asthma    WITH ASTHMA ATTACK   Fibroid 1980   GERD (gastroesophageal reflux disease)    Heart murmur    Hx gestational diabetes    Hyperlipidemia    Hypertension 2014   Inguinal hernia 1959   Malaise and fatigue 2014   Nephropathy    Non-IgE mediated allergic asthma 2014   Obesity    Pelvic pain    Pregnancy, high-risk 1985   Tonsillitis 1968   Uterine fibroid 1980     Family History  Problem Relation Age of Onset   Cancer Mother        abdominal melamona   Psoriasis Mother    Alzheimer's disease Father    Cancer Cousin        colon     Diabetes Cousin    Diabetes Maternal Aunt    Colon cancer Neg Hx    Colon polyps Neg Hx    Esophageal cancer Neg Hx    Rectal cancer Neg Hx    Stomach cancer Neg Hx    Breast cancer Neg Hx      Current Outpatient Medications:    albuterol (PROVENTIL) (2.5 MG/3ML) 0.083% nebulizer solution, Take 3 mLs (2.5 mg total) by nebulization every 4 (four) hours as needed for wheezing or shortness of breath., Disp: 75 mL, Rfl: 2   albuterol (VENTOLIN HFA) 108 (90 Base) MCG/ACT inhaler, Inhale 2 puffs into the lungs every 4 (four) hours as needed for wheezing or shortness of breath., Disp: 18 g, Rfl: 3   amoxicillin-clavulanate (AUGMENTIN) 500-125 MG tablet, Take 1 tablet (500 mg total) by mouth daily., Disp: 5 tablet, Rfl: 0   aspirin 81 MG tablet, Take 81 mg by mouth daily., Disp: , Rfl:    b complex vitamins capsule, Take 1 capsule by mouth daily., Disp: , Rfl:    BESIVANCE 0.6 % SUSP, Place 1 drop into both eyes See admin instructions. After  eye injections, Disp: , Rfl:    calcitRIOL (ROCALTROL) 0.25 MCG capsule, Take 0.25 mcg by mouth 3 (three) times a week., Disp: , Rfl:    Caraway Oil-Levomenthol (FDGARD PO), Take 1 tablet by mouth 2 (two) times daily as needed (nausea)., Disp: , Rfl:    carvedilol (COREG) 3.125 MG tablet, Take 1 tablet (3.125 mg total) by mouth 2 (two) times daily with a meal., Disp: 60 tablet, Rfl: 0   Cholecalciferol (VITAMIN D3 PO), Take 2,000 Units by mouth daily., Disp: , Rfl:    Continuous Blood Gluc Receiver ([place]) DEVI, Use as directed to check blood sugars 3 times per day dx: e11.65, Disp: 1 each, Rfl: 1   Continuous Blood Gluc Sensor (DEXCOM G6 SENSOR) MISC, Use as directed to check blood sugars 3 times per day dx: e11.65, Disp: 3 each, Rfl: 2   Continuous Blood Gluc Transmit (DEXCOM G6 TRANSMITTER) MISC, Use as directed to check blood sugars 3 times per day dx: e11.65, Disp: 1 each, Rfl: 1   fluticasone (FLONASE) 50 MCG/ACT nasal spray, Place 2 sprays  into the nose daily as needed for allergies. , Disp: , Rfl:    furosemide (LASIX) 40 MG tablet, Take 1-2 tablets (40-80 mg total) by mouth every Monday, Wednesday, and Friday. 80 mg twice a week. 40 mg all other days (Patient taking differently: Take 40-80 mg by mouth every Monday, Wednesday, and Friday. 40mg  Monday Wednesday and Friday.), Disp: 30 tablet, Rfl:    furosemide (LASIX) 80 MG tablet, Tuesday Thursday saturday, Disp: 30 tablet, Rfl: 11   insulin aspart protamine - aspart (NOVOLOG MIX 70/30 FLEXPEN) (70-30) 100 UNIT/ML FlexPen, Inject 1 mL (100 Units total) into the skin 2 (two) times daily with a meal. 20 units in the morning and 15 units at night, Disp: , Rfl:    iron polysaccharides (NIFEREX) 150 MG capsule, Take 1 capsule (150 mg total) by mouth daily., Disp: 30 capsule, Rfl: 12   lactobacillus acidophilus (BACID) TABS, Take 1 tablet by mouth daily. PROBIOTIC, Disp: , Rfl:    magnesium oxide (MAG-OX) 400 (241.3 Mg) MG tablet, Take 400 mg by mouth daily., Disp: , Rfl:    mometasone-formoterol (DULERA) 200-5 MCG/ACT AERO, Inhale 2 puffs into the lungs 2 (two) times daily. (Patient not taking: Reported on 03/09/2021), Disp: 1 each, Rfl: 1   ondansetron (ZOFRAN) 4 MG tablet, Take 1 tablet (4 mg total) by mouth every 6 (six) hours as needed for nausea or vomiting., Disp: 90 tablet, Rfl: 1   OneTouch Delica Lancets 99M MISC, Use as directed to check blood sugars before breakfast and dinner dx: e11.65, Disp: 100 each, Rfl: 11   ONETOUCH VERIO test strip, CHECK BLOOD SUGAR TWICE DAILY AS DIRECTED, Disp: 50 strip, Rfl: 3   polyethylene glycol (MIRALAX / GLYCOLAX) 17 g packet, Take 17 g by mouth daily. (Patient taking differently: Take 17 g by mouth daily as needed for mild constipation.), Disp: 14 each, Rfl: 0   Polyvinyl Alcohol-Povidone (REFRESH OP), Place 1 drop into both eyes daily as needed (dry eyes)., Disp: , Rfl:    rosuvastatin (CRESTOR) 20 MG tablet, Take 1 tablet (20 mg total) by mouth  daily., Disp: 90 tablet, Rfl: 3   sevelamer carbonate (RENVELA) 800 MG tablet, Take 1 tablet (800 mg total) by mouth 3 (three) times daily with meals., Disp: 90 tablet, Rfl: 0   SYMBICORT 160-4.5 MCG/ACT inhaler, Inhale 2 puffs into the lungs 2 (two) times daily., Disp: 10.2 g, Rfl: 5  traMADol (ULTRAM) 50 MG tablet, Take 1 tablet (50 mg total) by mouth every 12 (twelve) hours as needed for severe pain., Disp: 30 tablet, Rfl: 0   vitamin C (ASCORBIC ACID) 500 MG tablet, Take 500 mg by mouth daily., Disp: , Rfl:    Allergies  Allergen Reactions   Food Anaphylaxis    Peanuts; Almonds   Other Shortness Of Breath    Peanuts; Almonds   Wheat Bran Shortness Of Breath   Statins Itching and Other (See Comments)    Generalized aches   Iodine Other (See Comments)    The patient denied this allergy to me on 03/16/2021   Shellfish Allergy Other (See Comments)    Mouth gets raw   Sitagliptin     Other reaction(s): Unknown   Tetracycline Other (See Comments)    Raw mouth   Tetracyclines & Related Itching   Contrast Media [Iodinated Diagnostic Agents] Rash    Happened during CT scan over 30 years ago     Review of Systems  Constitutional:  Positive for fatigue. Negative for chills and fever.  HENT:  Negative for congestion, sinus pressure and sinus pain.   Respiratory:  Positive for shortness of breath. Negative for cough and wheezing.   Cardiovascular:  Negative for chest pain and palpitations.  Gastrointestinal:  Negative for diarrhea and nausea.  Musculoskeletal:  Negative for arthralgias.  Neurological:  Negative for headaches.    Today's Vitals   04/01/21 1446  BP: (!) 160/82  Pulse: 78  Temp: 98.3 F (36.8 C)  TempSrc: Oral  Weight: 189 lb 12.8 oz (86.1 kg)  Height: 5' 1.6" (1.565 m)   Body mass index is 35.17 kg/m.   Objective:  Physical Exam Constitutional:      Appearance: She is well-developed. She is obese.  HENT:     Head: Normocephalic and atraumatic.   Cardiovascular:     Rate and Rhythm: Normal rate and regular rhythm.  Pulmonary:     Effort: Pulmonary effort is normal.     Breath sounds: Decreased breath sounds present. No wheezing or rales.  Musculoskeletal:     Right lower leg: Edema present.     Left lower leg: Edema present.  Skin:    General: Skin is warm and dry.     Capillary Refill: Capillary refill takes less than 2 seconds.  Neurological:     Mental Status: She is alert and oriented to person, place, and time.        Assessment And Plan:     1. Shortness of breath -Did a walk test in office. Patient walked around the office with CMA and walker for 6 min. Oxygen saturating rate was in the 90's.  -Checked oxygen rate while laying down in the office and her oxygen ranged from 88-94%.  -Correlating comorbidities to SOB include asthma, anemia, CHF and CKD (Stage V) on dialysis.  -Patient reports she has a hard time at nighttime with breathing and she gets short of breath.  -Reviewed results with patient  -Reviewed lab result with patient.   2. CKD (chronic kidney disease), stage V ([place]) -Pt. Gets dialysis M,W,F at [facility].  -Reviewed labs   3. Acute on chronic diastolic CHF (congestive heart failure) (HCC) -chronic, stable, continue meds -Follow up with cardiologist Dr. Tamala Julian. -Reviewed labs.  -Advised patient to elevate head of bed when she lays down.  -Elevate her legs at rest.   4. Uncontrolled type 2 diabetes mellitus with hyperglycemia (HCC)  -Chronic, stable,  continue meds  -Reviewed recent labs  -follow up with nephrology --Discussed with patient the importance of glycemic control and long term complications from uncontrolled diabetes. Discussed with the patient the importance of compliance with home glucose monitoring, diet which includes decrease amount of sugary drinks and foods. Importance of exercise was also discussed with the patient. Importance of eye exams, self foot care and  compliance to office visits was also discussed with the patient.   5. Hypertension secondary to other renal disorders  -Chronic. BP is up in office today because she states she was coming into the doctors office Continue current meds.   Follow up: if symptoms persist or do not get better.   Patient was given opportunity to ask questions. Patient verbalized understanding of the plan and was able to repeat key elements of the plan. All questions were answered to their satisfaction.  Carolyn Jasiyah Poland, DNP   I, Carolyn Cathie Bonnell have reviewed all documentation for this visit. The documentation on 04/03/21 for the exam, diagnosis, procedures, and orders are all accurate and complete.    THE PATIENT IS ENCOURAGED TO PRACTICE SOCIAL DISTANCING DUE TO THE COVID-19 PANDEMIC.

## 2021-04-01 NOTE — Patient Instructions (Signed)
Shortness of Breath, Adult Shortness of breath means you have trouble breathing. Shortness of breath could be a sign of a medical problem. Follow these instructions at home:  Watch for any changes in your symptoms.  Do not use any products that contain nicotine or tobacco, such as cigarettes, e-cigarettes, and chewing tobacco.  Do not smoke. Smoking can cause shortness of breath. If you need help to quit smoking, ask your doctor.  Avoid things that can make it harder to breathe, such as: ? Mold. ? Dust. ? Air pollution. ? Chemical smells. ? Things that can cause allergy symptoms (allergens), if you have allergies.  Keep your living space clean. Use products that help remove mold and dust.  Rest as needed. Slowly return to your normal activities.  Take over-the-counter and prescription medicines only as told by your doctor. This includes oxygen therapy and inhaled medicines.  Keep all follow-up visits as told by your doctor. This is important.   Contact a doctor if:  Your condition does not get better as soon as expected.  You have a hard time doing your normal activities, even after you rest.  You have new symptoms. Get help right away if:  Your shortness of breath gets worse.  You have trouble breathing when you are resting.  You feel light-headed or you pass out (faint).  You have a cough that is not helped by medicines.  You cough up blood.  You have pain with breathing.  You have pain in your chest, arms, shoulders, or belly (abdomen).  You have a fever.  You cannot walk up stairs.  You cannot exercise the way you normally do. These symptoms may represent a serious problem that is an emergency. Do not wait to see if the symptoms will go away. Get medical help right away. Call your local emergency services (911 in the [place] Do not drive yourself to the hospital. Summary  Shortness of breath is when you have trouble breathing enough air. It can be a sign of a  medical problem.  Avoid things that make it hard for you to breathe, such as smoking, pollution, mold, and dust.  Watch for any changes in your symptoms. Contact your doctor if you do not get better or you get worse. This information is not intended to replace advice given to you by your health care provider. Make sure you discuss any questions you have with your health care provider. Document Revised: 03/12/2018 Document Reviewed: 03/12/2018 Elsevier Patient Education  2021 Elsevier Inc.  

## 2021-04-02 ENCOUNTER — Other Ambulatory Visit: Payer: Self-pay

## 2021-04-02 ENCOUNTER — Telehealth: Payer: Medicare Other

## 2021-04-02 ENCOUNTER — Ambulatory Visit: Payer: Self-pay

## 2021-04-02 ENCOUNTER — Ambulatory Visit: Payer: Medicare Other

## 2021-04-02 DIAGNOSIS — E1165 Type 2 diabetes mellitus with hyperglycemia: Secondary | ICD-10-CM

## 2021-04-02 DIAGNOSIS — I5033 Acute on chronic diastolic (congestive) heart failure: Secondary | ICD-10-CM

## 2021-04-02 DIAGNOSIS — Z992 Dependence on renal dialysis: Secondary | ICD-10-CM | POA: Diagnosis not present

## 2021-04-02 DIAGNOSIS — J810 Acute pulmonary edema: Secondary | ICD-10-CM

## 2021-04-02 DIAGNOSIS — R0602 Shortness of breath: Secondary | ICD-10-CM

## 2021-04-02 DIAGNOSIS — N186 End stage renal disease: Secondary | ICD-10-CM | POA: Diagnosis not present

## 2021-04-02 DIAGNOSIS — N2581 Secondary hyperparathyroidism of renal origin: Secondary | ICD-10-CM | POA: Diagnosis not present

## 2021-04-02 DIAGNOSIS — D631 Anemia in chronic kidney disease: Secondary | ICD-10-CM | POA: Diagnosis not present

## 2021-04-02 DIAGNOSIS — N185 Chronic kidney disease, stage 5: Secondary | ICD-10-CM

## 2021-04-02 NOTE — Chronic Care Management (AMB) (Signed)
Chronic Care Management    Social Work Note  04/02/2021 Name: Carolyn Russo MRN: [id number] DOB: 01/15/1951  Carolyn Russo is a 70 y.o. year old female who is a primary care patient of [name], MD. The CCM team was consulted to assist the patient with chronic disease management and/or care coordination needs related to: [employer] .   Collaboration with [facility]  for  resource follow up  in response to provider referral for social work chronic care management and care coordination services.   Consent to Services:  The patient was given information about Chronic Care Management services, agreed to services, and gave verbal consent prior to initiation of services.  Please see initial visit note for detailed documentation.   Patient agreed to services and consent obtained.   Assessment: Review of patient past medical history, allergies, medications, and health status, including review of relevant consultants reports was performed today as part of a comprehensive evaluation and provision of chronic care management and care coordination services.     SDOH (Social Determinants of Health) assessments and interventions performed:    Advanced Directives Status: Not addressed in this encounter.  CCM Care Plan  Allergies  Allergen Reactions   Food Anaphylaxis    Peanuts; Almonds   Other Shortness Of Breath    Peanuts; Almonds   Wheat Bran Shortness Of Breath   Statins Itching and Other (See Comments)    Generalized aches   Iodine Other (See Comments)    The patient denied this allergy to me on 03/16/2021   Shellfish Allergy Other (See Comments)    Mouth gets raw   Sitagliptin     Other reaction(s): Unknown   Tetracycline Other (See Comments)    Raw mouth   Tetracyclines & Related Itching   Contrast Media [Iodinated Diagnostic Agents] Rash    Happened during CT scan over 30 years ago    Outpatient Encounter Medications as of 04/02/2021  Medication  Sig   albuterol (PROVENTIL) (2.5 MG/3ML) 0.083% nebulizer solution Take 3 mLs (2.5 mg total) by nebulization every 4 (four) hours as needed for wheezing or shortness of breath.   albuterol (VENTOLIN HFA) 108 (90 Base) MCG/ACT inhaler Inhale 2 puffs into the lungs every 4 (four) hours as needed for wheezing or shortness of breath.   amoxicillin-clavulanate (AUGMENTIN) 500-125 MG tablet Take 1 tablet (500 mg total) by mouth daily.   aspirin 81 MG tablet Take 81 mg by mouth daily.   b complex vitamins capsule Take 1 capsule by mouth daily.   BESIVANCE 0.6 % SUSP Place 1 drop into both eyes See admin instructions. After  eye injections   calcitRIOL (ROCALTROL) 0.25 MCG capsule Take 0.25 mcg by mouth 3 (three) times a week.   Caraway Oil-Levomenthol (FDGARD PO) Take 1 tablet by mouth 2 (two) times daily as needed (nausea).   carvedilol (COREG) 3.125 MG tablet Take 1 tablet (3.125 mg total) by mouth 2 (two) times daily with a meal.   Cholecalciferol (VITAMIN D3 PO) Take 2,000 Units by mouth daily.   Continuous Blood Gluc Receiver (DEXCOM G6 RECEIVER) DEVI Use as directed to check blood sugars 3 times per day dx: e11.65   Continuous Blood Gluc Sensor (DEXCOM G6 SENSOR) MISC Use as directed to check blood sugars 3 times per day dx: e11.65   Continuous Blood Gluc Transmit (DEXCOM G6 TRANSMITTER) MISC Use as directed to check blood sugars 3 times per day dx: e11.65   fluticasone (FLONASE) 50  MCG/ACT nasal spray Place 2 sprays into the nose daily as needed for allergies.    furosemide (LASIX) 40 MG tablet Take 1-2 tablets (40-80 mg total) by mouth every Monday, Wednesday, and Friday. 80 mg twice a week. 40 mg all other days (Patient taking differently: Take 40-80 mg by mouth every Monday, Wednesday, and Friday. 40mg  Monday Wednesday and Friday.)   furosemide (LASIX) 80 MG tablet Tuesday Thursday saturday   insulin aspart protamine - aspart (NOVOLOG MIX 70/30 FLEXPEN) (70-30) 100 UNIT/ML FlexPen Inject 1 mL  (100 Units total) into the skin 2 (two) times daily with a meal. 20 units in the morning and 15 units at night   iron polysaccharides (NIFEREX) 150 MG capsule Take 1 capsule (150 mg total) by mouth daily.   lactobacillus acidophilus (BACID) TABS Take 1 tablet by mouth daily. PROBIOTIC   magnesium oxide (MAG-OX) 400 (241.3 Mg) MG tablet Take 400 mg by mouth daily.   mometasone-formoterol (DULERA) 200-5 MCG/ACT AERO Inhale 2 puffs into the lungs 2 (two) times daily. (Patient not taking: Reported on 03/09/2021)   ondansetron (ZOFRAN) 4 MG tablet Take 1 tablet (4 mg total) by mouth every 6 (six) hours as needed for nausea or vomiting.   OneTouch Delica Lancets 50K MISC Use as directed to check blood sugars before breakfast and dinner dx: e11.65   ONETOUCH VERIO test strip CHECK BLOOD SUGAR TWICE DAILY AS DIRECTED   polyethylene glycol (MIRALAX / GLYCOLAX) 17 g packet Take 17 g by mouth daily. (Patient taking differently: Take 17 g by mouth daily as needed for mild constipation.)   Polyvinyl Alcohol-Povidone (REFRESH OP) Place 1 drop into both eyes daily as needed (dry eyes).   rosuvastatin (CRESTOR) 20 MG tablet Take 1 tablet (20 mg total) by mouth daily.   sevelamer carbonate (RENVELA) 800 MG tablet Take 1 tablet (800 mg total) by mouth 3 (three) times daily with meals.   SYMBICORT 160-4.5 MCG/ACT inhaler Inhale 2 puffs into the lungs 2 (two) times daily.   traMADol (ULTRAM) 50 MG tablet Take 1 tablet (50 mg total) by mouth every 12 (twelve) hours as needed for severe pain.   vitamin C (ASCORBIC ACID) 500 MG tablet Take 500 mg by mouth daily.   No facility-administered encounter medications on file as of 04/02/2021.    Patient Active Problem List   Diagnosis Date Noted   Hypertensive urgency 03/10/2021   Hilar enlargement    Symptomatic anemia    Acute on chronic diastolic CHF (congestive heart failure) ([place]) 03/09/2021   CKD (chronic kidney disease), stage V ([place]) 03/08/2021   Diabetic  retinopathy ([place]) 03/08/2021   Hyperglycemia due to type 2 diabetes mellitus ([place]) 03/08/2021   Pure hypercholesterolemia 03/08/2021   Anemia in chronic kidney disease 09/24/2020   Uncontrolled type 2 diabetes mellitus with hyperglycemia ([place]) 03/19/2019   Osteopenia 09/04/2018   Migraine 09/04/2018   Asthma 09/04/2018   Pseudophakia of both eyes 07/17/2018   Pseudophakia of left eye 07/03/2018   Open angle with borderline findings and high glaucoma risk in left eye 07/03/2018   Age-related nuclear cataract of right eye 07/03/2018   CAD (coronary artery disease), native coronary artery 04/27/2017   Abnormal nuclear stress test 04/04/2017   Dyspnea on exertion 03/09/2017   Appendicitis    Age-related hypermature cataract of both eyes 12/20/2016   Uterine leiomyoma 11/27/2012   Dyslipidemia (high LDL; low HDL) 11/25/2011   Obesity (BMI 30-39.9) 11/25/2011   Hypertensive disorder 11/21/2011   Type 2 diabetes mellitus (  [place]) 11/21/2011   Abnormal EKG 11/21/2011    Conditions to be addressed/monitored:  Acute Pulmonary Edema, Acute on Chronic Diastolic CHF, CKD Stage V, Uncontrolled Type 2 Diabetes ; ADL IADL limitations  Care Plan : Social Work [facility] Plan of Care  Updates made by Daneen Schick since 04/02/2021 12:00 AM     Problem: Disease Progression      Long-Range Goal: Disease Progression Managed   Start Date: 03/30/2021  Expected End Date: 07/28/2021  Priority: High  Note:   Current Barriers:  Chronic disease management support and education needs related to  Acute Pulmonary Edema, Acute on Chronic Diastolic CHF, CKD Stage V, and DM II   Recent 15 day inpatient day Limited knowledge of resources to assist with patient care needs ADL/iADL limitations  Social Worker Clinical Goal(s):  patient will work with SW to identify and address any acute and/or chronic care coordination needs related to the self health management of  Acute Pulmonary Edema, Acute on Chronic Diastolic CHF,  CKD Stage V, DM II   Patient will work with SW to become more knowledgeable of transportation resources to assist with transportation home from Dialysis Patient will work with SW to identify caregiver resources to assist with patient care needs Patient will engage with Consulting civil engineer to develop an individualized plan of care to address clinical care management needs  SW Interventions:  Inter-disciplinary care team collaboration (see longitudinal plan of care) Collaboration with [name], MD regarding development and update of comprehensive plan of care as evidenced by provider attestation and co-signature Collaboration with RN Care Manager who requests SW re-send resources to patients spouse e-mail as he is having difficulty retrieving SW resent resources to [employer] and aol accounts   Patient Goals/Self-Care Activities patient will:   -  Attend upcomming OV scheduled 6/9 -Review resources provided by SW -Engage with Consulting civil engineer -Participate with home health services -Contact SW as needed prior to next scheduled call  Follow Up Plan:  SW will contact the patient over the next 10 days       Follow Up Plan: SW will follow up with patient by phone over the next 10 days      Daneen Schick, BSW, CDP Social Worker, Certified Dementia Practitioner [place] [facility] [place] [facility] [phone]  Total time spent performing care coordination and/or care management activities with the patient by phone or face to face = 10 minutes.

## 2021-04-03 ENCOUNTER — Encounter: Payer: Self-pay | Admitting: Nurse Practitioner

## 2021-04-05 DIAGNOSIS — N186 End stage renal disease: Secondary | ICD-10-CM | POA: Diagnosis not present

## 2021-04-05 DIAGNOSIS — N2581 Secondary hyperparathyroidism of renal origin: Secondary | ICD-10-CM | POA: Diagnosis not present

## 2021-04-05 DIAGNOSIS — Z992 Dependence on renal dialysis: Secondary | ICD-10-CM | POA: Diagnosis not present

## 2021-04-05 DIAGNOSIS — D631 Anemia in chronic kidney disease: Secondary | ICD-10-CM | POA: Diagnosis not present

## 2021-04-06 DIAGNOSIS — N186 End stage renal disease: Secondary | ICD-10-CM | POA: Diagnosis not present

## 2021-04-06 DIAGNOSIS — I160 Hypertensive urgency: Secondary | ICD-10-CM | POA: Diagnosis not present

## 2021-04-06 DIAGNOSIS — I132 Hypertensive heart and chronic kidney disease with heart failure and with stage 5 chronic kidney disease, or end stage renal disease: Secondary | ICD-10-CM | POA: Diagnosis not present

## 2021-04-06 DIAGNOSIS — E1122 Type 2 diabetes mellitus with diabetic chronic kidney disease: Secondary | ICD-10-CM | POA: Diagnosis not present

## 2021-04-06 DIAGNOSIS — D631 Anemia in chronic kidney disease: Secondary | ICD-10-CM | POA: Diagnosis not present

## 2021-04-06 DIAGNOSIS — I5033 Acute on chronic diastolic (congestive) heart failure: Secondary | ICD-10-CM | POA: Diagnosis not present

## 2021-04-06 NOTE — Patient Instructions (Addendum)
Patient Care Plan: End Stage Renal disease on Hemodialysis      Problem Identified: Adjustment to End Stage Renal disease   Priority: High     Long-Range Goal: Optimal Coping   Start Date: 04/02/2021  Expected End Date: 04/01/2022  This Visit's Progress: On track  Priority: High  Note:   Current Barriers:  Ineffective Self Health Maintenance  Clinical Goal(s):  Collaboration with [name], MD regarding development and update of comprehensive plan of care as evidenced by provider attestation and co-signature Inter-disciplinary care team collaboration (see longitudinal plan of care) patient will work with care management team to address care coordination and chronic disease management needs related to Disease Management Educational Needs Care Coordination Medication Management and Education Psychosocial Support Caregiver Stress support   Interventions:  04/02/21 completed successful call with husband Dr. [name] Evaluation of current treatment plan related to CKD Stage V , self-management and patient's adherence to plan as established by provider. Collaboration with [name], MD regarding development and update of comprehensive plan of care as evidenced by provider attestation       and co-signature Inter-disciplinary care team collaboration (see longitudinal plan of care) Determined patient started hemodialysis approximately 2 weeks ago during a recent hospitalization  Currently patient is receiving outpatient hemodialysis on Monday, Wednesday and Friday at the [facility] Determined patient is scheduled for vascular surgery for AVF placement scheduled for 04/27/21  Discussed spouse has observed patient experiencing extreme exhaustion following her hemodialysis treatments; following her most recent treatment, she experienced a hypoglycemic event Educated on the importance of closely monitoring blood glucose levels prior to, during and after HD in order to  determine if diabetic medication changes are needed Instructed patient/spouse to speak with the in clinic HD dietician for recommendations pertaining to fluid and dietary restrictions  Educated spouse about other methods of dialysis including in home hemodialysis and peritoneal dialysis  Mailed printed educational material related to Peritoneal dialysis  Discussed plans with patient for ongoing care management follow up and provided patient with direct contact information for care management team Self Care Activities:  Patient verbalizes understanding of plan for Vascular surgery scheduled for 04/27/21 with Dr. Deitra Mayo for AVF placement  Self administers medications as prescribed Attends all scheduled provider appointments Calls pharmacy for medication refills Calls provider office for new concerns or questions Patient Goals: - continue to adhere to dietary and fluid recommendations as prescribed by Nephrology team   Follow Up Plan: Telephone follow up appointment with care management team member scheduled for: 05/20/21      Patient Care Plan: Diabetes Type 2 (Adult)     Problem Identified: Glycemic Management (Diabetes, Type 2)   Priority: Medium     Long-Range Goal: Glycemic Management Optimized   Start Date: 04/02/2021  Expected End Date: 04/01/2022  This Visit's Progress: On track  Priority: Medium  Note:   Objective:  Lab Results  Component Value Date   HGBA1C 6.2 (H) 02/17/2021   Lab Results  Component Value Date   CREATININE 4.57 (H) 03/24/2021   CREATININE 3.13 (H) 03/23/2021   CREATININE 4.48 (H) 03/22/2021   Lab Results  Component Value Date   EGFR 9 (L) 02/17/2021   Current Barriers:  Knowledge Deficits related to basic Diabetes pathophysiology and self care/management Knowledge Deficits related to medications used for management of diabetes Case Manager Clinical Goal(s):  patient will demonstrate improved adherence to prescribed treatment plan for  diabetes self care/management as evidenced by: daily monitoring  and recording of CBG  adherence to ADA/ carb modified diet exercise 5 days/week adherence to prescribed medication regimen contacting provider for new or worsened symptoms or questions Interventions:  04/02/21 completed successful outbound call with spouse Dr. [name]  Collaboration with [name], MD regarding development and update of comprehensive plan of care as evidenced by provider attestation and co-signature Inter-disciplinary care team collaboration (see longitudinal plan of care) Provided education to patient about basic DM disease process Review of patient status, including review of consultant's reports, relevant laboratory and other test results, and medications completed. Reviewed medications with patient and discussed importance of medication adherence Educated patient on dietary and exercise recommendations; daily glycemic control FBS 80-130, <180 after meals;15'15' rule  Discussed plans with patient for ongoing care management follow up and provided patient with direct contact information for care management team Self-Care Activities - Self administers oral medications as prescribed Self administers insulin as prescribed Attends all scheduled provider appointments Checks blood sugars as prescribed and utilize hyper and hypoglycemia protocol as needed Adheres to prescribed ADA/carb modified Patient Goals: - check blood sugar at prescribed times - check blood sugar before and after exercise - check blood sugar if I feel it is too high or too low - enter blood sugar readings and medication or insulin into daily log - take the blood sugar log to all doctor visits - take the blood sugar meter to all doctor visits - manage portion size - keep appointment with eye doctor - schedule appointment with eye doctor - check feet daily for cuts, sores or redness - do heel pump exercise 2 to 3 times each day - keep feet  up while sitting - trim toenails straight across - wash and dry feet carefully every day - wear comfortable, cotton socks - wear comfortable, well-fitting shoes  Follow Up Plan: Telephone follow up appointment with care management team member scheduled for: 05/20/21     Patient Care Plan: Hypertension (Adult)     Problem Identified: Hypertension (Hypertension)   Priority: High     Long-Range Goal: Hypertension Monitored   Start Date: 04/02/2021  Expected End Date: 04/01/2022  This Visit's Progress: On track  Priority: High  Note:   Objective:  Last practice recorded BP readings:  BP Readings from Last 3 Encounters:  04/01/21 (!) 160/82  03/24/21 (!) 155/63  03/04/21 (!) 175/72   Most recent eGFR/CrCl:  Lab Results  Component Value Date   EGFR 9 (L) 02/17/2021    No components found for: CRCL Current Barriers:  Knowledge Deficits related to basic understanding of hypertension pathophysiology and self care management Knowledge Deficits related to understanding of medications prescribed for management of hypertension Case Manager Clinical Goal(s):  patient will verbalize understanding of plan for hypertension management Interventions:  04/02/21 completed successful outbound call with spouse Dr. [name]  Collaboration with [name], MD regarding development and update of comprehensive plan of care as evidenced by provider attestation and co-signature Inter-disciplinary care team collaboration (see longitudinal plan of care) Provided education to patient about basic hypertension disease process Review of patient status, including review of consultant's reports, relevant laboratory and other test results, and medications completed. Reviewed medications with patient and discussed importance of medication adherence Educated on effects hemodialysis can have on the blood pressure, instructed spouse to monitor at home and record readings to track BP; Instructed to notify  patient's Nephrologist and or PCP with any BP concerns  Discussed plans with patient for ongoing care management follow up and provided  patient with direct contact information for care management team Self-Care Activities: - Self administers medications as prescribed Attends all scheduled provider appointments Calls provider office for new concerns, questions, or BP outside discussed parameters Checks BP and records as discussed Follows a low sodium diet/DASH diet Patient Goals: - check blood pressure 3 times per week - choose a place to take my blood pressure (home, clinic or office, retail store) - write blood pressure results in a log or diary - learn about high blood pressure  Follow Up Plan: Telephone follow up appointment with care management team member scheduled for: 05/20/21     Patient Care Plan: Heart Failure (Adult)     Problem Identified: Symptom Exacerbation (Heart Failure)   Priority: Medium     Long-Range Goal: Symptom Exacerbation Prevented or Minimized   Start Date: 04/02/2021  Expected End Date: 04/01/2022  This Visit's Progress: On track  Priority: Medium  Note:   Current Barriers:  Knowledge deficits related to basic heart failure pathophysiology and self care management Nurse Case Manager Clinical Goal(s):  patient will weigh self daily and record patient will verbalize understanding of Heart Failure Action Plan and when to call doctor patient will take all Heart Failure mediations as prescribed Interventions:  04/02/21 completed successful outbound call with spouse, Dr. [name]  Collaboration with [name], MD regarding development and update of comprehensive plan of care as evidenced by provider attestation and co-signature Inter-disciplinary care team collaboration (see longitudinal plan of care) Basic overview and discussion of pathophysiology of Heart Failure Reviewed Heart Failure Action Plan in depth and provided written copy Assessed for  scales in home Discussed importance of daily weight Reviewed role of diuretics in prevention of fluid overload Instructed spouse to review Furosemide dosage with Nephrology team to confirm dosing/frequency is appropriate for patient Sent Urgent Pharm D referral to embedded Pharmacist for polypharmacy/medication reconciliation and help determined if Furosemide dosage/frequency is appropriate for patient due to ESRD on HD Discussed plans with patient for ongoing care management follow up and provided patient with direct contact information for care management team Self-Care Activities:  Takes Heart Failure Medications as prescribed Verbalizes understanding of and follows CHF Action Plan Adheres to low sodium diet  Patient Goals:  - Take Heart Failure Medications as prescribed - Weigh daily and record (notify MD with 3 lb weight gain over night or 5 lb in a week) - Follow CHF Action Plan - Adhere to low sodium diet - follow rescue plan if symptoms flare-up - know when to call the doctor - track symptoms and what helps feel better or worse - keep legs up while sitting - meet with dietitian (outpatient dialysis Dietician) - track weight in diary - use salt in moderation - watch for swelling in feet, ankles and legs every day - weigh myself daily  Follow Up Plan: Telephone follow up appointment with care management team member scheduled for: 05/20/21     Goals Addressed      Adjustment to End Stage Renal disease   On track    Timeframe:  Long-Range Goal Priority:  High Start Date: 04/02/21                            Expected End Date: 04/01/21  Next Scheduled Follow up date: 05/20/21         Self Care Activities:  Patient verbalizes understanding of plan for Vascular surgery scheduled for 04/27/21 with Dr. Deitra Mayo for AVF  placement  Self administers medications as prescribed Attends all scheduled provider appointments Calls pharmacy for medication refills Calls provider  office for new concerns or questions Patient Goals: - continue to adhere to dietary and fluid recommendations as prescribed by Nephrology team                   COMPLETED: Assist with Chronic Care Management and Care Coordination needs       Timeframe:  Short-Term Goal Priority:  High Start Date:  03/30/21                           Expected End Date:  05/21/21  Initial RNCM Outreach Scheduled for: 04/02/21     Patient Goals/Self-Care Activities patient will:   - Patient will work with the CCM team to address chronic care management and care coordination needs and will continue to work with the clinical team to address health care and disease management related needs.    Follow Up Plan: The care management team will reach out to the patient again over the next 30 days.                         Monitor and Manage My Blood Sugar-Diabetes Type 2   On track    Timeframe:  Long-Range Goal Priority:  Medium Start Date:  04/02/21                         Expected End Date:  04/01/22                     Follow Up Date 05/20/21    Self-Care Activities Self administers oral medications as prescribed Self administers insulin as prescribed Attends all scheduled provider appointments Checks blood sugars as prescribed and utilize hyper and hypoglycemia protocol as needed Adheres to prescribed ADA/carb modified Patient Goals: - check blood sugar at prescribed times - check blood sugar before and after exercise - check blood sugar if I feel it is too high or too low - enter blood sugar readings and medication or insulin into daily log - take the blood sugar log to all doctor visits - take the blood sugar meter to all doctor visits - manage portion size   Why is this important?   Checking your blood sugar at home helps to keep it from getting very high or very low.  Writing the results in a diary or log helps the doctor know how to care for you.  Your blood sugar log should have the time, date and  the results.  Also, write down the amount of insulin or other medicine that you take.  Other information, like what you ate, exercise done and how you were feeling, will also be helpful.     Notes:       Obtain Eye Exam-Diabetes Type 2   On track    Timeframe:  Long-Range Goal Priority:  Medium Start Date:  04/02/21                           Expected End Date:  04/01/22                     Follow Up Date 05/20/21    - keep appointment with eye doctor - schedule appointment with eye doctor    Why is  this important?   Eye check-ups are important when you have diabetes.  Vision loss can be prevented.    Notes:       Perform Foot Care-Diabetes Type 2   On track    Timeframe:  Long-Range Goal Priority:  Medium Start Date:  04/02/21                            Expected End Date: 04/01/22                   Follow Up Date 05/20/21    - check feet daily for cuts, sores or redness - do heel pump exercise 2 to 3 times each day - keep feet up while sitting - trim toenails straight across - wash and dry feet carefully every day - wear comfortable, cotton socks - wear comfortable, well-fitting shoes    Why is this important?   Good foot care is very important when you have diabetes.  There are many things you can do to keep your feet healthy and catch a problem early.    Notes:       Track and Manage My Blood Pressure-Hypertension   On track    Timeframe:  Long-Range Goal Priority:  High Start Date:  04/02/21                         Expected End Date: 04/01/22                      Follow Up Date 05/20/21    - check blood pressure 3 times per week - choose a place to take my blood pressure (home, clinic or office, retail store) - write blood pressure results in a log or diary    Why is this important?   You won't feel high blood pressure, but it can still hurt your blood vessels.  High blood pressure can cause heart or kidney problems. It can also cause a stroke.  Making lifestyle  changes like losing a Benn Tarver weight or eating less salt will help.  Checking your blood pressure at home and at different times of the day can help to control blood pressure.  If the doctor prescribes medicine remember to take it the way the doctor ordered.  Call the office if you cannot afford the medicine or if there are questions about it.     Notes:       Track and Manage Symptoms-Heart Failure   On track    Timeframe:  Long-Range Goal Priority:  Medium Start Date:  04/02/21                            Expected End Date: 04/01/22                       Follow Up Date 05/20/21   Self-Care Activities:  Takes Heart Failure Medications as prescribed Verbalizes understanding of and follows CHF Action Plan Adheres to low sodium diet  Patient Goals:  - Take Heart Failure Medications as prescribed - Weigh daily and record (notify MD with 3 lb weight gain over night or 5 lb in a week) - Follow CHF Action Plan - Adhere to low sodium diet - follow rescue plan if symptoms flare-up - know when to call the doctor - track symptoms and what helps  feel better or worse - keep legs up while sitting - meet with dietitian (outpatient dialysis Dietician) - track weight in diary - use salt in moderation - watch for swelling in feet, ankles and legs every day - weigh myself daily   Why is this important?   You will be able to handle your symptoms better if you keep track of them.  Making some simple changes to your lifestyle will help.  Eating healthy is one thing you can do to take good care of yourself.    Notes:

## 2021-04-06 NOTE — Chronic Care Management (AMB) (Signed)
Chronic Care Management   CCM RN Visit Note  04/02/2021 Name: Carolyn Russo MRN: [id number] DOB: [birth year]  Subjective: Carolyn Russo is a 70 y.o. year old female who is a primary care patient of [name], MD. The care management team was consulted for assistance with disease management and care coordination needs.    Engaged with patient by telephone for initial visit in response to provider referral for case management and/or care coordination services.   Consent to Services:  The patient was given information about Chronic Care Management services, agreed to services, and gave verbal consent prior to initiation of services.  Please see initial visit note for detailed documentation.   Patient agreed to services and verbal consent obtained.   Assessment: Review of patient past medical history, allergies, medications, health status, including review of consultants reports, laboratory and other test data, was performed as part of comprehensive evaluation and provision of chronic care management services.   SDOH (Social Determinants of Health) assessments and interventions performed:  Yes, no acute challenges   CCM Care Plan  Allergies  Allergen Reactions   Food Anaphylaxis    Peanuts; Almonds   Other Shortness Of Breath    Peanuts; Almonds   Wheat Bran Shortness Of Breath   Statins Itching and Other (See Comments)    Generalized aches   Iodine Other (See Comments)    The patient denied this allergy to me on 03/16/2021   Shellfish Allergy Other (See Comments)    Mouth gets raw   Sitagliptin     Other reaction(s): Unknown   Tetracycline Other (See Comments)    Raw mouth   Tetracyclines & Related Itching   Contrast Media [Iodinated Diagnostic Agents] Rash    Happened during CT scan over 30 years ago    Outpatient Encounter Medications as of 04/02/2021  Medication Sig   albuterol (PROVENTIL) (2.5 MG/3ML) 0.083% nebulizer solution Take 3 mLs (2.5 mg total) by  nebulization every 4 (four) hours as needed for wheezing or shortness of breath.   albuterol (VENTOLIN HFA) 108 (90 Base) MCG/ACT inhaler Inhale 2 puffs into the lungs every 4 (four) hours as needed for wheezing or shortness of breath.   amoxicillin-clavulanate (AUGMENTIN) 500-125 MG tablet Take 1 tablet (500 mg total) by mouth daily.   aspirin 81 MG tablet Take 81 mg by mouth daily.   b complex vitamins capsule Take 1 capsule by mouth daily.   BESIVANCE 0.6 % SUSP Place 1 drop into both eyes See admin instructions. After  eye injections   calcitRIOL (ROCALTROL) 0.25 MCG capsule Take 0.25 mcg by mouth 3 (three) times a week.   Caraway Oil-Levomenthol (FDGARD PO) Take 1 tablet by mouth 2 (two) times daily as needed (nausea).   carvedilol (COREG) 3.125 MG tablet Take 1 tablet (3.125 mg total) by mouth 2 (two) times daily with a meal.   Cholecalciferol (VITAMIN D3 PO) Take 2,000 Units by mouth daily.   Continuous Blood Gluc Receiver (DEXCOM G6 RECEIVER) DEVI Use as directed to check blood sugars 3 times per day dx: e11.65   Continuous Blood Gluc Sensor (DEXCOM G6 SENSOR) MISC Use as directed to check blood sugars 3 times per day dx: e11.65   Continuous Blood Gluc Transmit (DEXCOM G6 TRANSMITTER) MISC Use as directed to check blood sugars 3 times per day dx: e11.65   fluticasone (FLONASE) 50 MCG/ACT nasal spray Place 2 sprays into the nose daily as needed for allergies.    furosemide (LASIX) 40 MG tablet  Take 1-2 tablets (40-80 mg total) by mouth every Monday, Wednesday, and Friday. 80 mg twice a week. 40 mg all other days (Patient taking differently: Take 40-80 mg by mouth every Monday, Wednesday, and Friday. 61m Monday Wednesday and Friday.)   furosemide (LASIX) 80 MG tablet Tuesday Thursday saturday   insulin aspart protamine - aspart (NOVOLOG MIX 70/30 FLEXPEN) (70-30) 100 UNIT/ML FlexPen Inject 1 mL (100 Units total) into the skin 2 (two) times daily with a meal. 20 units in the morning and 15  units at night   iron polysaccharides (NIFEREX) 150 MG capsule Take 1 capsule (150 mg total) by mouth daily.   lactobacillus acidophilus (BACID) TABS Take 1 tablet by mouth daily. PROBIOTIC   magnesium oxide (MAG-OX) 400 (241.3 Mg) MG tablet Take 400 mg by mouth daily.   mometasone-formoterol (DULERA) 200-5 MCG/ACT AERO Inhale 2 puffs into the lungs 2 (two) times daily. (Patient not taking: Reported on 03/09/2021)   ondansetron (ZOFRAN) 4 MG tablet Take 1 tablet (4 mg total) by mouth every 6 (six) hours as needed for nausea or vomiting.   OneTouch Delica Lancets 375OMISC Use as directed to check blood sugars before breakfast and dinner dx: e11.65   ONETOUCH VERIO test strip CHECK BLOOD SUGAR TWICE DAILY AS DIRECTED   polyethylene glycol (MIRALAX / GLYCOLAX) 17 g packet Take 17 g by mouth daily. (Patient taking differently: Take 17 g by mouth daily as needed for mild constipation.)   Polyvinyl Alcohol-Povidone (REFRESH OP) Place 1 drop into both eyes daily as needed (dry eyes).   rosuvastatin (CRESTOR) 20 MG tablet Take 1 tablet (20 mg total) by mouth daily.   sevelamer carbonate (RENVELA) 800 MG tablet Take 1 tablet (800 mg total) by mouth 3 (three) times daily with meals.   SYMBICORT 160-4.5 MCG/ACT inhaler Inhale 2 puffs into the lungs 2 (two) times daily.   traMADol (ULTRAM) 50 MG tablet Take 1 tablet (50 mg total) by mouth every 12 (twelve) hours as needed for severe pain.   vitamin C (ASCORBIC ACID) 500 MG tablet Take 500 mg by mouth daily.   No facility-administered encounter medications on file as of 04/02/2021.    Patient Active Problem List   Diagnosis Date Noted   Hypertensive urgency 03/10/2021   Hilar enlargement    Symptomatic anemia    Acute on chronic diastolic CHF (congestive heart failure) ([facility] 03/09/2021   CKD (chronic kidney disease), stage V ([facility] 03/08/2021   Diabetic retinopathy ([facility] 03/08/2021   Hyperglycemia due to type 2 diabetes mellitus ([facility] 03/08/2021   Pure  hypercholesterolemia 03/08/2021   Anemia in chronic kidney disease 09/24/2020   Uncontrolled type 2 diabetes mellitus with hyperglycemia ([facility] 03/19/2019   Osteopenia 09/04/2018   Migraine 09/04/2018   Asthma 09/04/2018   Pseudophakia of both eyes 07/17/2018   Pseudophakia of left eye 07/03/2018   Open angle with borderline findings and high glaucoma risk in left eye 07/03/2018   Age-related nuclear cataract of right eye 07/03/2018   CAD (coronary artery disease), native coronary artery 04/27/2017   Abnormal nuclear stress test 04/04/2017   Dyspnea on exertion 03/09/2017   Appendicitis    Age-related hypermature cataract of both eyes 12/20/2016   Uterine leiomyoma 11/27/2012   Dyslipidemia (high LDL; low HDL) 11/25/2011   Obesity (BMI 30-39.9) 11/25/2011   Hypertensive disorder 11/21/2011   Type 2 diabetes mellitus ([facility] 11/21/2011   Abnormal EKG 11/21/2011    Conditions to be addressed/monitored: Acute pulmonary edema, Acute on chronic diastolic CHF (  congestive heart failure), CKD (chronic kidney disease), stage V, Uncontrolled type 2 diabetes mellitus with hyperglycemia, Shortness of breath    Patient Care Plan: Assist with Chronic Care Management and Care Coordination needs   Patient Care Plan: End Stage Renal Disease on Hemodialysis     Problem Identified: Adjustment to End Stage Renal disease   Priority: High     Long-Range Goal: Optimal Coping   Start Date: 04/02/2021  Expected End Date: 04/01/2022  This Visit's Progress: On track  Priority: High  Note:   Current Barriers:  Ineffective Self Health Maintenance  Clinical Goal(s):  Collaboration with [name], MD regarding development and update of comprehensive plan of care as evidenced by provider attestation and co-signature Inter-disciplinary care team collaboration (see longitudinal plan of care) patient will work with care management team to address care coordination and chronic disease management needs  related to Disease Management Educational Needs Care Coordination Medication Management and Education Psychosocial Support Caregiver Stress support   Interventions:  04/02/21 completed successful call with husband Dr. [name] Evaluation of current treatment plan related to CKD Stage V , self-management and patient's adherence to plan as established by provider. Collaboration with [name], MD regarding development and update of comprehensive plan of care as evidenced by provider attestation       and co-signature Inter-disciplinary care team collaboration (see longitudinal plan of care) Determined patient started hemodialysis approximately 2 weeks ago during a recent hospitalization  Currently patient is receiving outpatient hemodialysis on Monday, Wednesday and Friday at the [facility] Determined patient is scheduled for vascular surgery for AVF placement scheduled for 04/27/21  Discussed spouse has observed patient experiencing extreme exhaustion following her hemodialysis treatments; following her most recent treatment, she experienced a hypoglycemic event Educated on the importance of closely monitoring blood glucose levels prior to, during and after HD in order to determine if diabetic medication changes are needed Instructed patient/spouse to speak with the in clinic HD dietician for recommendations pertaining to fluid and dietary restrictions  Educated spouse about other methods of dialysis including in home hemodialysis and peritoneal dialysis  Mailed printed educational material related to Peritoneal dialysis  Discussed plans with patient for ongoing care management follow up and provided patient with direct contact information for care management team Self Care Activities:  Patient verbalizes understanding of plan for Vascular surgery scheduled for 04/27/21 with Dr. Deitra Mayo for AVF placement  Self administers medications as prescribed Attends all  scheduled provider appointments Calls pharmacy for medication refills Calls provider office for new concerns or questions Patient Goals: - continue to adhere to dietary and fluid recommendations as prescribed by Nephrology team   Follow Up Plan: Telephone follow up appointment with care management team member scheduled for: 05/20/21     Patient Care Plan: Diabetes Type 2 (Adult)     Problem Identified: Glycemic Management (Diabetes, Type 2)   Priority: Medium     Long-Range Goal: Glycemic Management Optimized   Start Date: 04/02/2021  Expected End Date: 04/01/2022  This Visit's Progress: On track  Priority: Medium  Note:   Objective:  Lab Results  Component Value Date   HGBA1C 6.2 (H) 02/17/2021   Lab Results  Component Value Date   CREATININE 4.57 (H) 03/24/2021   CREATININE 3.13 (H) 03/23/2021   CREATININE 4.48 (H) 03/22/2021   Lab Results  Component Value Date   EGFR 9 (L) 02/17/2021   Current Barriers:  Knowledge Deficits related to basic Diabetes pathophysiology and self care/management Knowledge Deficits  related to medications used for management of diabetes Case Manager Clinical Goal(s):  patient will demonstrate improved adherence to prescribed treatment plan for diabetes self care/management as evidenced by: daily monitoring and recording of CBG  adherence to ADA/ carb modified diet exercise 5 days/week adherence to prescribed medication regimen contacting provider for new or worsened symptoms or questions Interventions:  04/02/21 completed successful outbound call with spouse Dr. [name]  Collaboration with [name], MD regarding development and update of comprehensive plan of care as evidenced by provider attestation and co-signature Inter-disciplinary care team collaboration (see longitudinal plan of care) Provided education to patient about basic DM disease process Review of patient status, including review of consultant's reports, relevant laboratory  and other test results, and medications completed. Reviewed medications with patient and discussed importance of medication adherence Educated patient on dietary and exercise recommendations; daily glycemic control FBS 80-130, <180 after meals;15'15' rule  Discussed plans with patient for ongoing care management follow up and provided patient with direct contact information for care management team Self-Care Activities - Self administers oral medications as prescribed Self administers insulin as prescribed Attends all scheduled provider appointments Checks blood sugars as prescribed and utilize hyper and hypoglycemia protocol as needed Adheres to prescribed ADA/carb modified Patient Goals: - check blood sugar at prescribed times - check blood sugar before and after exercise - check blood sugar if I feel it is too high or too low - enter blood sugar readings and medication or insulin into daily log - take the blood sugar log to all doctor visits - take the blood sugar meter to all doctor visits - manage portion size - keep appointment with eye doctor - schedule appointment with eye doctor - check feet daily for cuts, sores or redness - do heel pump exercise 2 to 3 times each day - keep feet up while sitting - trim toenails straight across - wash and dry feet carefully every day - wear comfortable, cotton socks - wear comfortable, well-fitting shoes  Follow Up Plan: Telephone follow up appointment with care management team member scheduled for: 05/20/21     Patient Care Plan: Hypertension (Adult)     Problem Identified: Hypertension (Hypertension)   Priority: High     Long-Range Goal: Hypertension Monitored   Start Date: 04/02/2021  Expected End Date: 04/01/2022  This Visit's Progress: On track  Priority: High  Note:   Objective:  Last practice recorded BP readings:  BP Readings from Last 3 Encounters:  04/01/21 (!) 160/82  03/24/21 (!) 155/63  03/04/21 (!) 175/72   Most  recent eGFR/CrCl:  Lab Results  Component Value Date   EGFR 9 (L) 02/17/2021    No components found for: CRCL Current Barriers:  Knowledge Deficits related to basic understanding of hypertension pathophysiology and self care management Knowledge Deficits related to understanding of medications prescribed for management of hypertension Case Manager Clinical Goal(s):  patient will verbalize understanding of plan for hypertension management Interventions:  04/02/21 completed successful outbound call with spouse Dr. [name]  Collaboration with [name], MD regarding development and update of comprehensive plan of care as evidenced by provider attestation and co-signature Inter-disciplinary care team collaboration (see longitudinal plan of care) Provided education to patient about basic hypertension disease process Review of patient status, including review of consultant's reports, relevant laboratory and other test results, and medications completed. Reviewed medications with patient and discussed importance of medication adherence Educated on effects hemodialysis can have on the blood pressure, instructed spouse to monitor at home  and record readings to track BP; Instructed to notify patient's Nephrologist and or PCP with any BP concerns  Discussed plans with patient for ongoing care management follow up and provided patient with direct contact information for care management team Self-Care Activities: - Self administers medications as prescribed Attends all scheduled provider appointments Calls provider office for new concerns, questions, or BP outside discussed parameters Checks BP and records as discussed Follows a low sodium diet/DASH diet Patient Goals: - check blood pressure 3 times per week - choose a place to take my blood pressure (home, clinic or office, retail store) - write blood pressure results in a log or diary - learn about high blood pressure  Follow Up Plan:  Telephone follow up appointment with care management team member scheduled for: 05/20/21     Patient Care Plan: Heart Failure (Adult)     Problem Identified: Symptom Exacerbation (Heart Failure)   Priority: Medium     Long-Range Goal: Symptom Exacerbation Prevented or Minimized   Start Date: 04/02/2021  Expected End Date: 04/01/2022  This Visit's Progress: On track  Priority: Medium  Note:   Current Barriers:  Knowledge deficits related to basic heart failure pathophysiology and self care management Nurse Case Manager Clinical Goal(s):  patient will weigh self daily and record patient will verbalize understanding of Heart Failure Action Plan and when to call doctor patient will take all Heart Failure mediations as prescribed Interventions:  04/02/21 completed successful outbound call with spouse, Dr. [name]  Collaboration with [name], MD regarding development and update of comprehensive plan of care as evidenced by provider attestation and co-signature Inter-disciplinary care team collaboration (see longitudinal plan of care) Basic overview and discussion of pathophysiology of Heart Failure Reviewed Heart Failure Action Plan in depth and provided written copy Assessed for scales in home Discussed importance of daily weight Reviewed role of diuretics in prevention of fluid overload Instructed spouse to review Furosemide dosage with Nephrology team to confirm dosing/frequency is appropriate for patient Sent Urgent Pharm D referral to embedded Pharmacist for polypharmacy/medication reconciliation and help determined if Furosemide dosage/frequency is appropriate for patient due to ESRD on HD Discussed plans with patient for ongoing care management follow up and provided patient with direct contact information for care management team Self-Care Activities:  Takes Heart Failure Medications as prescribed Verbalizes understanding of and follows CHF Action Plan Adheres to low sodium  diet  Patient Goals:  - Take Heart Failure Medications as prescribed - Weigh daily and record (notify MD with 3 lb weight gain over night or 5 lb in a week) - Follow CHF Action Plan - Adhere to low sodium diet - follow rescue plan if symptoms flare-up - know when to call the doctor - track symptoms and what helps feel better or worse - keep legs up while sitting - meet with dietitian (outpatient dialysis Dietician) - track weight in diary - use salt in moderation - watch for swelling in feet, ankles and legs every day - weigh myself daily  Follow Up Plan: Telephone follow up appointment with care management team member scheduled for: 05/20/21    Plan:Telephone follow up appointment with care management team member scheduled for:  05/20/21  Barb Merino, RN, BSN, CCM Care Management Coordinator [facility]  Direct Phone: [phone]

## 2021-04-07 ENCOUNTER — Other Ambulatory Visit: Payer: Self-pay

## 2021-04-07 DIAGNOSIS — I160 Hypertensive urgency: Secondary | ICD-10-CM | POA: Diagnosis not present

## 2021-04-07 DIAGNOSIS — I132 Hypertensive heart and chronic kidney disease with heart failure and with stage 5 chronic kidney disease, or end stage renal disease: Secondary | ICD-10-CM | POA: Diagnosis not present

## 2021-04-07 DIAGNOSIS — D631 Anemia in chronic kidney disease: Secondary | ICD-10-CM | POA: Diagnosis not present

## 2021-04-07 DIAGNOSIS — Z992 Dependence on renal dialysis: Secondary | ICD-10-CM | POA: Diagnosis not present

## 2021-04-07 DIAGNOSIS — I5033 Acute on chronic diastolic (congestive) heart failure: Secondary | ICD-10-CM | POA: Diagnosis not present

## 2021-04-07 DIAGNOSIS — E1122 Type 2 diabetes mellitus with diabetic chronic kidney disease: Secondary | ICD-10-CM | POA: Diagnosis not present

## 2021-04-07 DIAGNOSIS — N2581 Secondary hyperparathyroidism of renal origin: Secondary | ICD-10-CM | POA: Diagnosis not present

## 2021-04-07 DIAGNOSIS — N186 End stage renal disease: Secondary | ICD-10-CM | POA: Diagnosis not present

## 2021-04-08 ENCOUNTER — Telehealth: Payer: Self-pay

## 2021-04-08 ENCOUNTER — Telehealth: Payer: Medicare Other

## 2021-04-08 NOTE — Telephone Encounter (Signed)
  Care Management   Follow Up Note   04/08/2021 Name: Carolyn Russo MRN: [id number] DOB: 09/28/1951   Referred by: [name], MD Reason for referral : Chronic Care Management (Unsuccessful call)   An unsuccessful telephone outreach was attempted today. The patient was referred to the case management team for assistance with care management and care coordination. SW left a HIPAA compliant voice message requesting a return call.  Follow Up Plan: The care management team will reach out to the patient again over the next 21 days.   Daneen Schick, BSW, CDP Social Worker, Certified Dementia Practitioner [place] [facility] [phone]

## 2021-04-09 ENCOUNTER — Telehealth: Payer: Self-pay | Admitting: *Deleted

## 2021-04-09 DIAGNOSIS — D631 Anemia in chronic kidney disease: Secondary | ICD-10-CM | POA: Diagnosis not present

## 2021-04-09 DIAGNOSIS — N186 End stage renal disease: Secondary | ICD-10-CM | POA: Diagnosis not present

## 2021-04-09 DIAGNOSIS — N2581 Secondary hyperparathyroidism of renal origin: Secondary | ICD-10-CM | POA: Diagnosis not present

## 2021-04-09 DIAGNOSIS — Z992 Dependence on renal dialysis: Secondary | ICD-10-CM | POA: Diagnosis not present

## 2021-04-09 NOTE — Chronic Care Management (AMB) (Signed)
  Chronic Care Management   Note  04/09/2021 Name: QUEENIE AUFIERO MRN: [id number] DOB: 07-14-1951  Shermika F Olthoff is a 70 y.o. year old female who is a primary care patient of [name], MD. JAEDYN MARRUFO is currently enrolled in care management services. An additional referral for Pharmacy was placed.   Follow up plan: A telephone outreach attempt made. The care management team will reach out to the patient again over the next 5 days. If patient returns call to provider office, please advise to call [place] [facility]

## 2021-04-12 ENCOUNTER — Telehealth: Payer: Self-pay | Admitting: Interventional Cardiology

## 2021-04-12 ENCOUNTER — Other Ambulatory Visit: Payer: Self-pay

## 2021-04-12 ENCOUNTER — Telehealth: Payer: Medicare Other

## 2021-04-12 DIAGNOSIS — Z992 Dependence on renal dialysis: Secondary | ICD-10-CM | POA: Diagnosis not present

## 2021-04-12 DIAGNOSIS — N186 End stage renal disease: Secondary | ICD-10-CM | POA: Diagnosis not present

## 2021-04-12 DIAGNOSIS — D631 Anemia in chronic kidney disease: Secondary | ICD-10-CM | POA: Diagnosis not present

## 2021-04-12 DIAGNOSIS — N2581 Secondary hyperparathyroidism of renal origin: Secondary | ICD-10-CM | POA: Diagnosis not present

## 2021-04-12 MED ORDER — HYDROCORTISONE (PERIANAL) 2.5 % EX CREA: 1.0000 "application " | TOPICAL_CREAM | Freq: Two times a day (BID) | CUTANEOUS | 1 refills | Status: DC

## 2021-04-12 NOTE — Telephone Encounter (Signed)
The pt's husband Dr [name] was notified that Dr Baird Cancer sent in anusul to the pharmacy for the pt to help with her hemorrhoids.

## 2021-04-12 NOTE — Telephone Encounter (Signed)
Patients husband came in to drop off a Disability Form, authorization, and pay $29 fee. HIM verified Patient's signature with Driver's license on file. Form has been placed in Dr. Thompson Caul box to be completed. AO 04/12/21

## 2021-04-13 DIAGNOSIS — I132 Hypertensive heart and chronic kidney disease with heart failure and with stage 5 chronic kidney disease, or end stage renal disease: Secondary | ICD-10-CM | POA: Diagnosis not present

## 2021-04-13 DIAGNOSIS — I5033 Acute on chronic diastolic (congestive) heart failure: Secondary | ICD-10-CM | POA: Diagnosis not present

## 2021-04-13 DIAGNOSIS — N186 End stage renal disease: Secondary | ICD-10-CM | POA: Diagnosis not present

## 2021-04-13 DIAGNOSIS — E1122 Type 2 diabetes mellitus with diabetic chronic kidney disease: Secondary | ICD-10-CM | POA: Diagnosis not present

## 2021-04-13 DIAGNOSIS — D631 Anemia in chronic kidney disease: Secondary | ICD-10-CM | POA: Diagnosis not present

## 2021-04-13 DIAGNOSIS — I160 Hypertensive urgency: Secondary | ICD-10-CM | POA: Diagnosis not present

## 2021-04-13 NOTE — Chronic Care Management (AMB) (Signed)
  Chronic Care Management   Outreach Note  04/13/2021 Name: [name] MRN: [id number] DOB: 11-26-1950  Carolyn Russo is a 69 y.o. year old female who is a primary care patient of [name], MD. I reached out to Lyndee Hensen by phone today in response to a referral sent by Ms. Abygale F Hymon's PCP, Dr. Baird Cancer.      A second unsuccessful telephone outreach was attempted today. The patient was referred to the case management team for assistance with care management and care coordination.   Follow Up Plan: A HIPAA compliant phone message was left for the patient providing contact information and requesting a return call. The care management team will reach out to the patient again over the next 7 days.  If patient returns call to provider office, please advise to call [place] at [phone].  [facility]

## 2021-04-14 ENCOUNTER — Other Ambulatory Visit: Payer: Self-pay

## 2021-04-14 ENCOUNTER — Telehealth: Payer: Self-pay

## 2021-04-14 ENCOUNTER — Encounter: Payer: Self-pay | Admitting: Vascular Surgery

## 2021-04-14 ENCOUNTER — Ambulatory Visit (INDEPENDENT_AMBULATORY_CARE_PROVIDER_SITE_OTHER): Payer: Medicare Other | Admitting: Vascular Surgery

## 2021-04-14 ENCOUNTER — Emergency Department (HOSPITAL_COMMUNITY)
Admission: EM | Admit: 2021-04-14 | Discharge: 2021-04-15 | Discharge status: Against Medical Advice | Payer: Medicare Other | Source: Home / Self Care | Attending: Emergency Medicine | Admitting: Emergency Medicine

## 2021-04-14 ENCOUNTER — Encounter (HOSPITAL_COMMUNITY): Payer: Self-pay | Admitting: Emergency Medicine

## 2021-04-14 ENCOUNTER — Emergency Department (HOSPITAL_COMMUNITY): Payer: Medicare Other

## 2021-04-14 VITALS — BP 129/84 | HR 84 | Temp 97.9°F | Resp 20 | Ht 61.0 in | Wt 189.0 lb

## 2021-04-14 DIAGNOSIS — Z5321 Procedure and treatment not carried out due to patient leaving prior to being seen by health care provider: Secondary | ICD-10-CM | POA: Insufficient documentation

## 2021-04-14 DIAGNOSIS — Z20822 Contact with and (suspected) exposure to covid-19: Secondary | ICD-10-CM | POA: Diagnosis not present

## 2021-04-14 DIAGNOSIS — R29898 Other symptoms and signs involving the musculoskeletal system: Secondary | ICD-10-CM | POA: Diagnosis not present

## 2021-04-14 DIAGNOSIS — I160 Hypertensive urgency: Secondary | ICD-10-CM | POA: Diagnosis not present

## 2021-04-14 DIAGNOSIS — R9431 Abnormal electrocardiogram [ECG] [EKG]: Secondary | ICD-10-CM | POA: Diagnosis not present

## 2021-04-14 DIAGNOSIS — Z992 Dependence on renal dialysis: Secondary | ICD-10-CM | POA: Diagnosis not present

## 2021-04-14 DIAGNOSIS — D6832 Hemorrhagic disorder due to extrinsic circulating anticoagulants: Secondary | ICD-10-CM | POA: Diagnosis not present

## 2021-04-14 DIAGNOSIS — R29810 Facial weakness: Secondary | ICD-10-CM | POA: Insufficient documentation

## 2021-04-14 DIAGNOSIS — N185 Chronic kidney disease, stage 5: Secondary | ICD-10-CM

## 2021-04-14 DIAGNOSIS — I132 Hypertensive heart and chronic kidney disease with heart failure and with stage 5 chronic kidney disease, or end stage renal disease: Secondary | ICD-10-CM | POA: Diagnosis not present

## 2021-04-14 DIAGNOSIS — E1122 Type 2 diabetes mellitus with diabetic chronic kidney disease: Secondary | ICD-10-CM | POA: Diagnosis not present

## 2021-04-14 DIAGNOSIS — D631 Anemia in chronic kidney disease: Secondary | ICD-10-CM | POA: Diagnosis not present

## 2021-04-14 DIAGNOSIS — I639 Cerebral infarction, unspecified: Secondary | ICD-10-CM | POA: Diagnosis not present

## 2021-04-14 DIAGNOSIS — N186 End stage renal disease: Secondary | ICD-10-CM | POA: Diagnosis not present

## 2021-04-14 DIAGNOSIS — D62 Acute posthemorrhagic anemia: Secondary | ICD-10-CM | POA: Diagnosis not present

## 2021-04-14 DIAGNOSIS — I5033 Acute on chronic diastolic (congestive) heart failure: Secondary | ICD-10-CM | POA: Diagnosis not present

## 2021-04-14 DIAGNOSIS — I6381 Other cerebral infarction due to occlusion or stenosis of small artery: Secondary | ICD-10-CM | POA: Diagnosis not present

## 2021-04-14 DIAGNOSIS — R2990 Unspecified symptoms and signs involving the nervous system: Secondary | ICD-10-CM

## 2021-04-14 DIAGNOSIS — N2581 Secondary hyperparathyroidism of renal origin: Secondary | ICD-10-CM | POA: Diagnosis not present

## 2021-04-14 DIAGNOSIS — R531 Weakness: Secondary | ICD-10-CM | POA: Diagnosis not present

## 2021-04-14 LAB — CBC
HCT: 26.7 % — ABNORMAL LOW (ref 36.0–46.0)
Hemoglobin: 8.8 g/dL — ABNORMAL LOW (ref 12.0–15.0)
MCH: 28.9 pg (ref 26.0–34.0)
MCHC: 33 g/dL (ref 30.0–36.0)
MCV: 87.5 fL (ref 80.0–100.0)
Platelets: 265 10*3/uL (ref 150–400)
RBC: 3.05 MIL/uL — ABNORMAL LOW (ref 3.87–5.11)
RDW: 15.9 % — ABNORMAL HIGH (ref 11.5–15.5)
WBC: 9.9 10*3/uL (ref 4.0–10.5)
nRBC: 0 % (ref 0.0–0.2)

## 2021-04-14 LAB — PROTIME-INR
INR: 1 (ref 0.8–1.2)
Prothrombin Time: 13.6 seconds (ref 11.4–15.2)

## 2021-04-14 LAB — I-STAT CHEM 8, ED
BUN: 16 mg/dL (ref 8–23)
Calcium, Ion: 1.07 mmol/L — ABNORMAL LOW (ref 1.15–1.40)
Chloride: 93 mmol/L — ABNORMAL LOW (ref 98–111)
Creatinine, Ser: 3.6 mg/dL — ABNORMAL HIGH (ref 0.44–1.00)
Glucose, Bld: 139 mg/dL — ABNORMAL HIGH (ref 70–99)
HCT: 31 % — ABNORMAL LOW (ref 36.0–46.0)
Hemoglobin: 10.5 g/dL — ABNORMAL LOW (ref 12.0–15.0)
Potassium: 4 mmol/L (ref 3.5–5.1)
Sodium: 135 mmol/L (ref 135–145)
TCO2: 30 mmol/L (ref 22–32)

## 2021-04-14 LAB — DIFFERENTIAL
Abs Immature Granulocytes: 0.02 10*3/uL (ref 0.00–0.07)
Basophils Absolute: 0 10*3/uL (ref 0.0–0.1)
Basophils Relative: 0 %
Eosinophils Absolute: 0.2 10*3/uL (ref 0.0–0.5)
Eosinophils Relative: 2 %
Immature Granulocytes: 0 %
Lymphocytes Relative: 29 %
Lymphs Abs: 2.9 10*3/uL (ref 0.7–4.0)
Monocytes Absolute: 1.4 10*3/uL — ABNORMAL HIGH (ref 0.1–1.0)
Monocytes Relative: 14 %
Neutro Abs: 5.4 10*3/uL (ref 1.7–7.7)
Neutrophils Relative %: 55 %

## 2021-04-14 LAB — COMPREHENSIVE METABOLIC PANEL
ALT: 21 U/L (ref 0–44)
AST: 27 U/L (ref 15–41)
Albumin: 3.4 g/dL — ABNORMAL LOW (ref 3.5–5.0)
Alkaline Phosphatase: 59 U/L (ref 38–126)
Anion gap: 9 (ref 5–15)
BUN: 14 mg/dL (ref 8–23)
CO2: 32 mmol/L (ref 22–32)
Calcium: 8.5 mg/dL — ABNORMAL LOW (ref 8.9–10.3)
Chloride: 94 mmol/L — ABNORMAL LOW (ref 98–111)
Creatinine, Ser: 3.58 mg/dL — ABNORMAL HIGH (ref 0.44–1.00)
GFR, Estimated: 13 mL/min — ABNORMAL LOW (ref >60)
Glucose, Bld: 143 mg/dL — ABNORMAL HIGH (ref 70–99)
Potassium: 4 mmol/L (ref 3.5–5.1)
Sodium: 135 mmol/L (ref 135–145)
Total Bilirubin: 0.7 mg/dL (ref 0.3–1.2)
Total Protein: 6.9 g/dL (ref 6.5–8.1)

## 2021-04-14 LAB — APTT: aPTT: 29 seconds (ref 24–36)

## 2021-04-14 LAB — CBG MONITORING, ED: Glucose-Capillary: 146 mg/dL — ABNORMAL HIGH (ref 70–99)

## 2021-04-14 IMAGING — CT CT HEAD W/O CM
3 series · 15 of 47 positions shown, 18 images · non-contrast
Comparison: None.

CLINICAL DATA: Possible stroke.  LEFT-sided weakness

EXAM:
CT HEAD WITHOUT CONTRAST
TECHNIQUE: Contiguous axial images were obtained from the base of the skull
through the vertex without intravenous contrast.

[Series 5: head 5.0 h30s · axial · 0.43mm/px · z∈[-208,-78]mm · 9 of 32 slices shown, 12 images]
[im 3/32  brain]
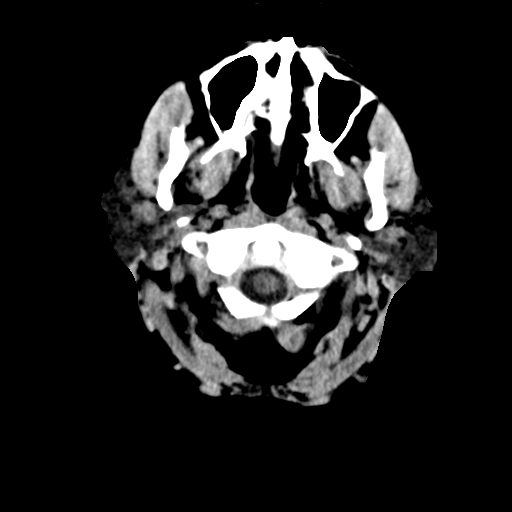
[im 3/32  bone]
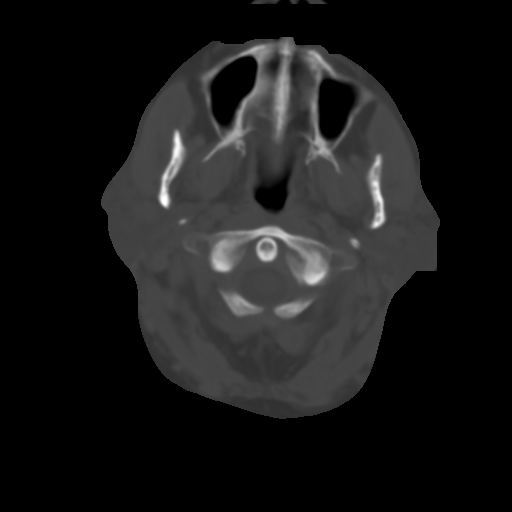
[im 6/32  brain]
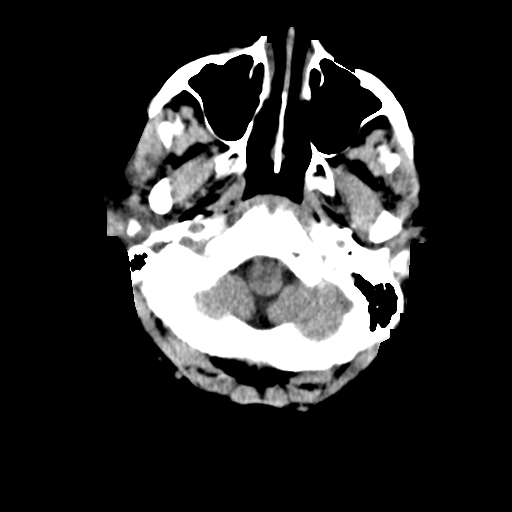
[im 9/32  brain]
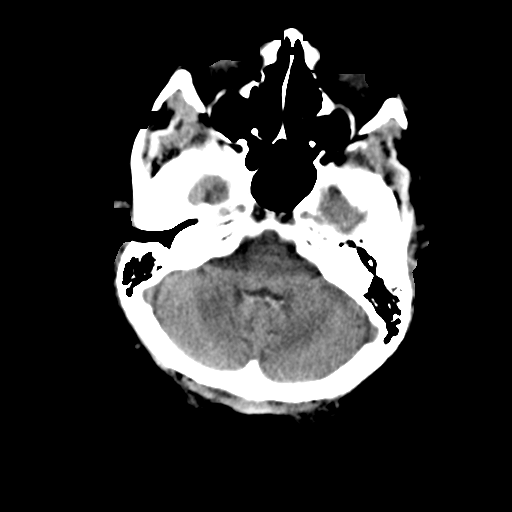
[im 12/32  brain]
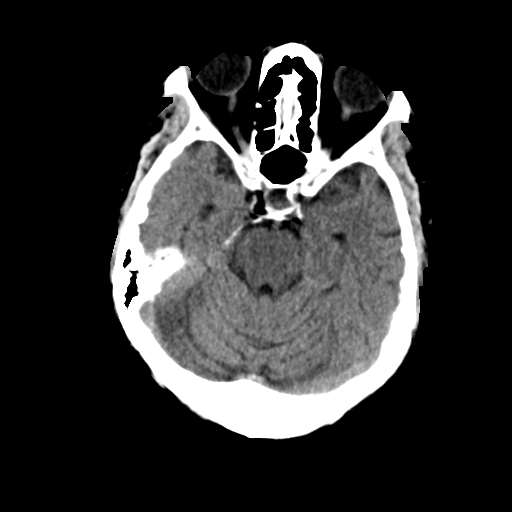
[im 17/32  brain]
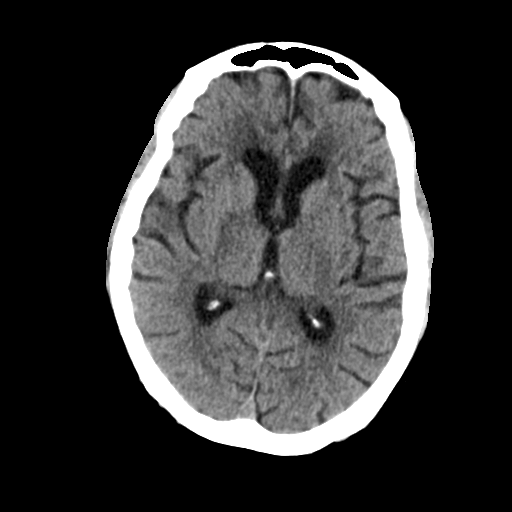
[im 17/32  bone]
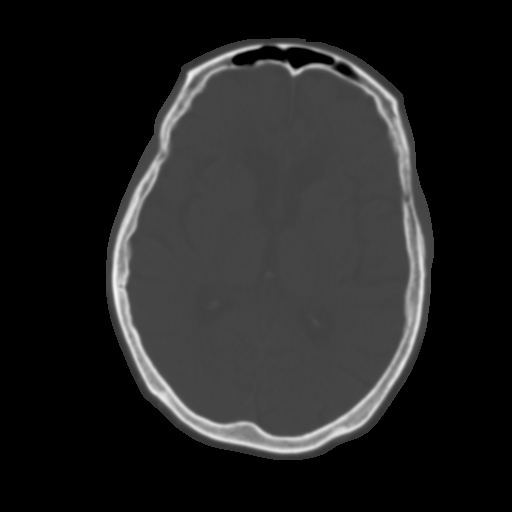
[im 20/32  brain]
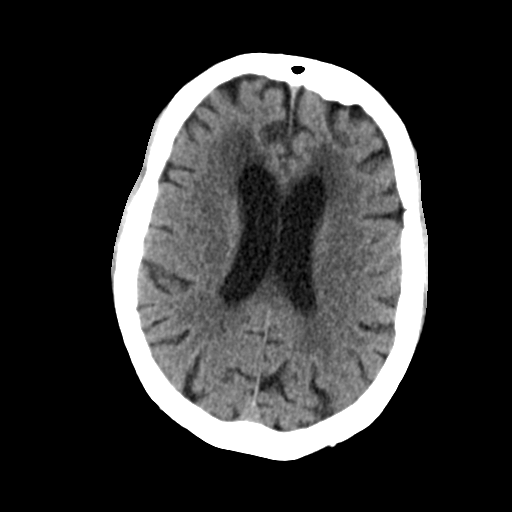
[im 23/32  brain]
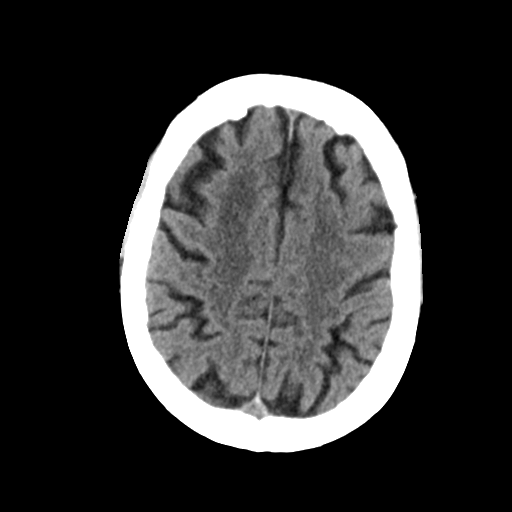
[im 26/32  brain]
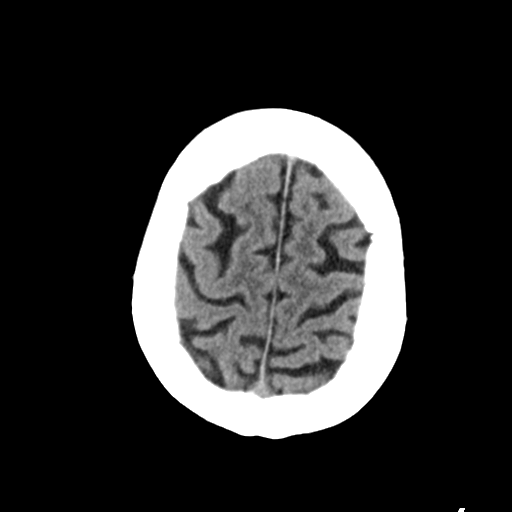
[im 29/32  brain]
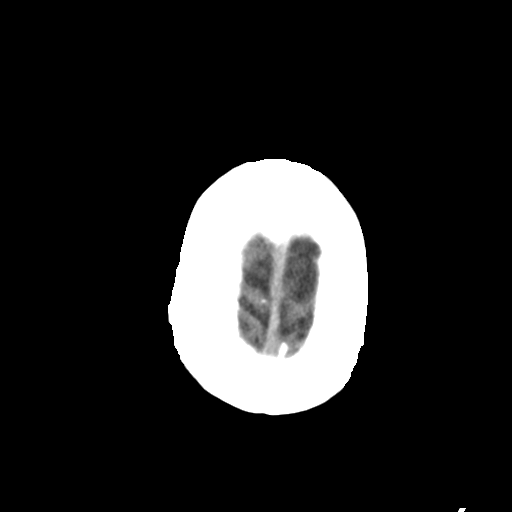
[im 29/32  bone]
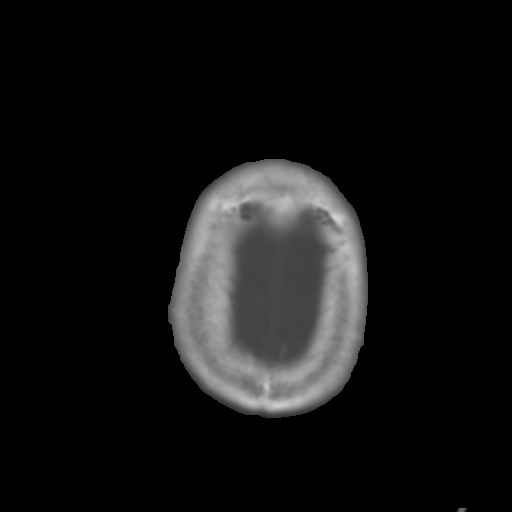

[Series 7: head 3.0 mpr cor · coronal · 0.33mm/px · 3 of 73 slices shown]
[im 25/73  brain]
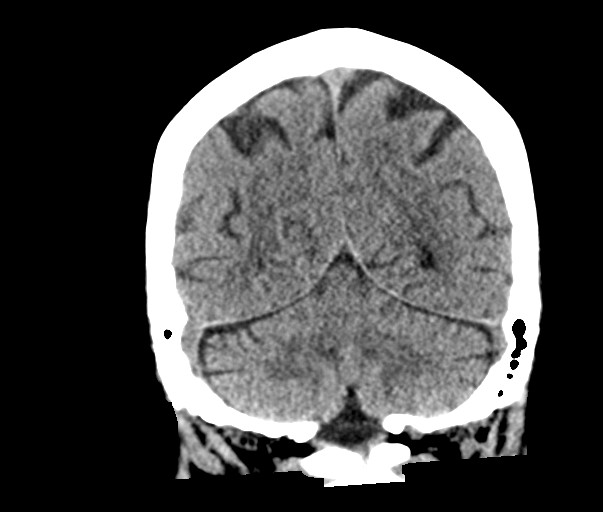
[im 33/73  brain]
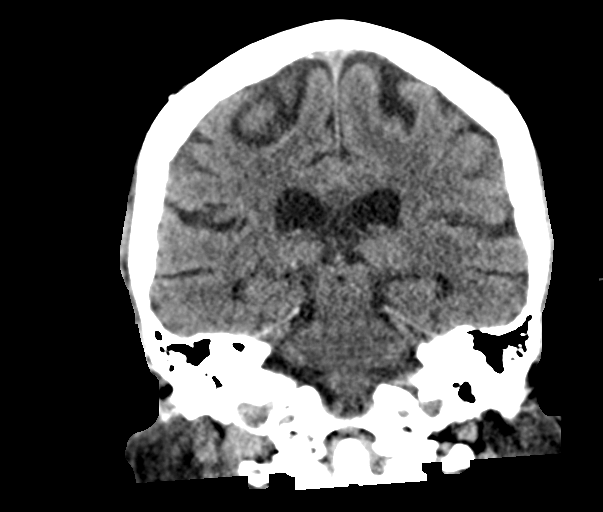
[im 41/73  brain]
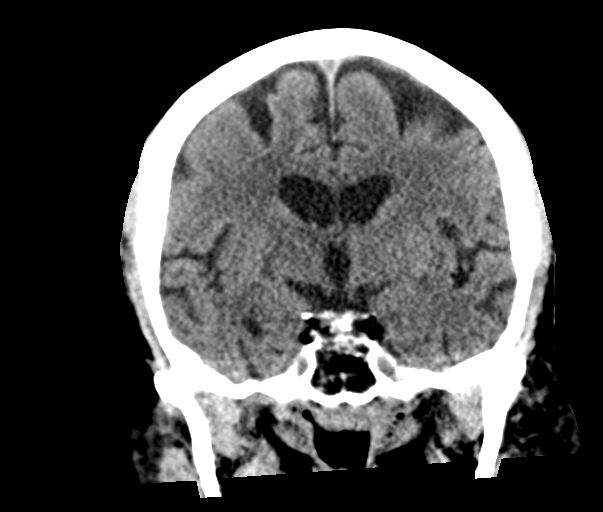

[Series 8: head 3.0 mpr sag · sagittal · 0.34mm/px · 3 of 67 slices shown]
[im 23/67  brain]
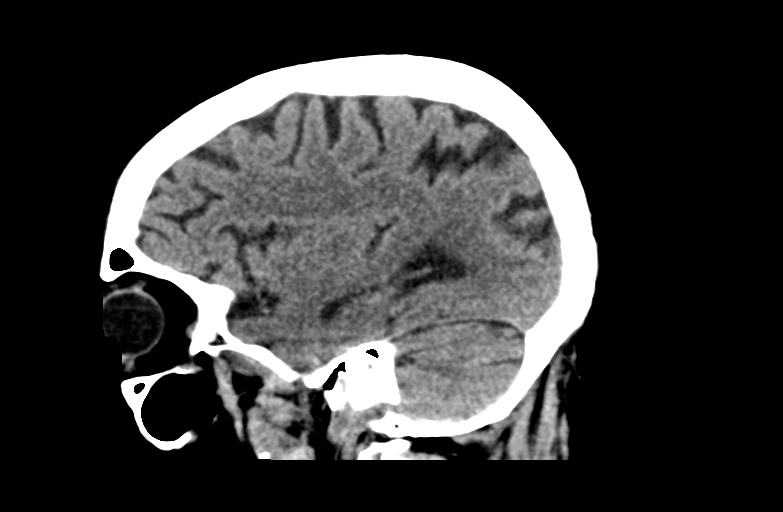
[im 34/67  brain]
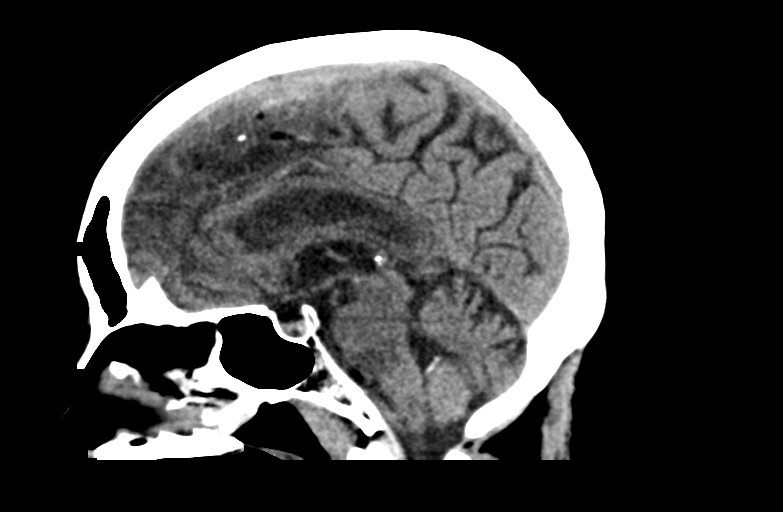
[im 45/67  brain]
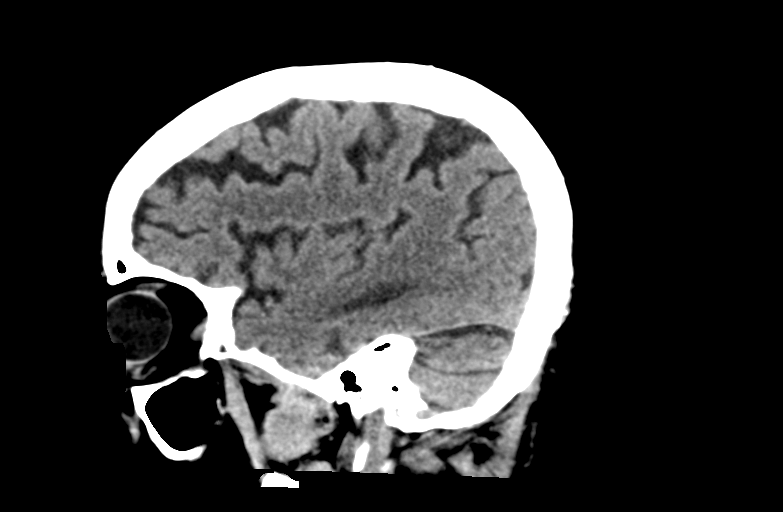

[15 of 47 positions shown; findings below may reference images not displayed]

FINDINGS: Brain: No acute intracranial hemorrhage. No focal mass lesion. No CT
evidence of acute infarction. No midline shift or mass effect. No
hydrocephalus. Basilar cisterns are patent.

There are periventricular and subcortical white matter
hypodensities. Generalized cortical atrophy.

Vascular: No hyperdense vessel or unexpected calcification.

Skull: Normal. Negative for fracture or focal lesion.

Sinuses/Orbits: Paranasal sinuses and mastoid air cells are clear.
Orbits are clear.

Other: None.
IMPRESSION: 1. No acute intracranial findings.
2. Atrophy and white matter microvascular disease.

## 2021-04-14 MED ORDER — SODIUM CHLORIDE 0.9% FLUSH: 3.0000 mL | Freq: Once | INTRAVENOUS | Status: DC

## 2021-04-14 NOTE — ED Triage Notes (Signed)
Pt here from home with stroke like symptom with left side facial droop and left arm weakness ,lsn 2 days ago

## 2021-04-14 NOTE — Addendum Note (Signed)
Addended by: Nicholas Lose on: 04/14/2021 12:21 PM   Modules accepted: Orders

## 2021-04-14 NOTE — Progress Notes (Signed)
Patient name: Carolyn Russo MRN: [id number] DOB: 1951/06/06 Sex: female  REASON FOR VISIT:   To discuss placement of AV fistula  HPI:   Carolyn Russo is a pleasant 70 y.o. female who I saw in consultation in the hospital for hemodialysis access.  I felt her best chance for a fistula was a basilic vein transposition on the right our plan was to do this in 2 stages.  She was taken to the operating room on 03/16/2021 and I exposed the basilic vein which was 3 mm in diameter.  After exposing the vein the patient became bradycardic and hypotensive with EKG changes.  The procedure was therefore aborted.  He was ultimately discharged in the hospital after her work-up was negative and returns to discuss proceeding with surgery.  Today, she seems fairly tired however she just finished dialysis.  She dialyzes on Monday Wednesdays and Fridays.  She has a functioning right IJ tunneled dialysis catheter.  She has had some generalized fatigue.  She also has some mild shortness of breath.  Current Outpatient Medications  Medication Sig Dispense Refill   albuterol (PROVENTIL) (2.5 MG/3ML) 0.083% nebulizer solution Take 3 mLs (2.5 mg total) by nebulization every 4 (four) hours as needed for wheezing or shortness of breath. 75 mL 2   albuterol (VENTOLIN HFA) 108 (90 Base) MCG/ACT inhaler Inhale 2 puffs into the lungs every 4 (four) hours as needed for wheezing or shortness of breath. 18 g 3   aspirin 81 MG tablet Take 81 mg by mouth daily.     b complex vitamins capsule Take 1 capsule by mouth daily.     BESIVANCE 0.6 % SUSP Place 1 drop into both eyes See admin instructions. After  eye injections     calcitRIOL (ROCALTROL) 0.25 MCG capsule Take 0.25 mcg by mouth 3 (three) times a week.     Caraway Oil-Levomenthol (FDGARD PO) Take 1 tablet by mouth 2 (two) times daily as needed (nausea).     carvedilol (COREG) 3.125 MG tablet Take 1 tablet (3.125 mg total) by mouth 2 (two) times daily with a meal. 60 tablet 0    Cholecalciferol (VITAMIN D3 PO) Take 2,000 Units by mouth daily.     Continuous Blood Gluc Receiver (DEXCOM G6 RECEIVER) DEVI Use as directed to check blood sugars 3 times per day dx: e11.65 1 each 1   Continuous Blood Gluc Sensor (DEXCOM G6 SENSOR) MISC Use as directed to check blood sugars 3 times per day dx: e11.65 3 each 2   Continuous Blood Gluc Transmit (DEXCOM G6 TRANSMITTER) MISC Use as directed to check blood sugars 3 times per day dx: e11.65 1 each 1   docusate sodium (COLACE) 100 MG capsule Take 100 mg by mouth daily as needed for mild constipation.     fluticasone (FLONASE) 50 MCG/ACT nasal spray Place 2 sprays into the nose daily as needed for allergies.      furosemide (LASIX) 80 MG tablet Tuesday Thursday saturday 30 tablet 11   hydrocortisone (ANUSOL-HC) 2.5 % rectal cream Place 1 application rectally 2 (two) times daily. 30 g 1   insulin aspart protamine - aspart (NOVOLOG MIX 70/30 FLEXPEN) (70-30) 100 UNIT/ML FlexPen Inject 1 mL (100 Units total) into the skin 2 (two) times daily with a meal. 20 units in the morning and 15 units at night     magnesium oxide (MAG-OX) 400 (241.3 Mg) MG tablet Take 400 mg by mouth daily.     mometasone-formoterol (DULERA) 200-5  MCG/ACT AERO Inhale 2 puffs into the lungs 2 (two) times daily. 1 each 1   ondansetron (ZOFRAN) 4 MG tablet Take 1 tablet (4 mg total) by mouth every 6 (six) hours as needed for nausea or vomiting. 90 tablet 1   OneTouch Delica Lancets 16S MISC Use as directed to check blood sugars before breakfast and dinner dx: e11.65 100 each 11   polyethylene glycol (MIRALAX / GLYCOLAX) 17 g packet Take 17 g by mouth daily. (Patient taking differently: Take 17 g by mouth daily as needed for mild constipation.) 14 each 0   rosuvastatin (CRESTOR) 20 MG tablet Take 1 tablet (20 mg total) by mouth daily. 90 tablet 3   sevelamer carbonate (RENVELA) 800 MG tablet Take 1 tablet (800 mg total) by mouth 3 (three) times daily with meals. 90 tablet 0    SYMBICORT 160-4.5 MCG/ACT inhaler Inhale 2 puffs into the lungs 2 (two) times daily. 10.2 g 5   traMADol (ULTRAM) 50 MG tablet Take 1 tablet (50 mg total) by mouth every 12 (twelve) hours as needed for severe pain. 30 tablet 0   vitamin C (ASCORBIC ACID) 500 MG tablet Take 500 mg by mouth daily.     furosemide (LASIX) 40 MG tablet Take 1-2 tablets (40-80 mg total) by mouth every Monday, Wednesday, and Friday. 80 mg twice a week. 40 mg all other days (Patient not taking: Reported on 04/14/2021) 30 tablet    iron polysaccharides (NIFEREX) 150 MG capsule Take 1 capsule (150 mg total) by mouth daily. (Patient not taking: Reported on 04/14/2021) 30 capsule 12   lactobacillus acidophilus (BACID) TABS Take 1 tablet by mouth daily. PROBIOTIC (Patient not taking: Reported on 04/14/2021)     Polyvinyl Alcohol-Povidone (REFRESH OP) Place 1 drop into both eyes daily as needed (dry eyes). (Patient not taking: Reported on 04/14/2021)     No current facility-administered medications for this visit.    REVIEW OF SYSTEMS:  [X]  denotes positive finding, [ ]  denotes negative finding Vascular    Leg swelling    Cardiac    Chest pain or chest pressure:    Shortness of breath upon exertion:    Short of breath when lying flat:    Irregular heart rhythm:    Constitutional    Fever or chills:     PHYSICAL EXAM:   Vitals:   04/14/21 1125  BP: 129/84  Pulse: 84  Resp: 20  Temp: 97.9 F (36.6 C)  SpO2: 93%  Weight: 189 lb (85.7 kg)  Height: 5\' 1"  (1.549 m)    GENERAL: The patient is a well-nourished female, in no acute distress. The vital signs are documented above. CARDIOVASCULAR: There is a regular rate and rhythm. PULMONARY: There is good air exchange bilaterally without wheezing or rales. VASCULAR: She has a palpable right radial pulse.  Her incision is healed nicely.  DATA:   VEIN MAP: I reviewed her previous vein map that was done on 03/12/2021.   On the right side, the upper arm cephalic vein  had diameters ranging from 1.6-2.5 mm.  The forearm cephalic vein could not be visualized.  Thus this did not appear to be adequate for a fistula.  The basilic vein on the right was somewhat marginal in size with diameters ranging from 2.9-3.5 mm.  On the left side the forearm and upper arm cephalic vein were not adequate.  Likewise the basilic vein did not appear to be adequate for a fistula.  MEDICAL ISSUES:   END-STAGE RENAL  DISEASE: I have again had a long discussion with the patient and her husband Dr. [name] about the options for dialysis.  I think her best chance for a fistula would be a basilic vein transposition on the right which would likely have to be done in 2 stages given that the vein is marginal in size.  If this vein were not adequate then I would recommend placement of an AV graft since she is now on dialysis.  We have discussed the indications for the surgery and the potential complications again and she is agreeable to proceed.  Her surgeries been scheduled for 04/27/2021.  Deitra Mayo [facility] [phone]

## 2021-04-14 NOTE — Telephone Encounter (Signed)
I spoke with the patient, Carolyn Russo, agricultural with [employer], and with the pt's spouse Dr. [name] notifying tham that Dr Baird Cancer said that the pt needed to go to the ER for evaluation because the nurse called and said that the pt has asymmetry in her face, some drooling, I myself noticed slurred speech once I was on the phone with the patient.

## 2021-04-14 NOTE — ED Provider Notes (Signed)
Emergency Medicine Provider Triage Evaluation Note  Carolyn Russo , a 70 y.o. female  was evaluated in triage.  Pt complains of left-sided facial droop, drooling, and left sided weakness x2 days.  While she was seen by her vascular surgeon today and this was not reported in there note, she continues to assure me that LKN was over 2 days ago.  For that reason, she is notGiven chronicity, not a code stroke.    Review of Systems  Positive: Weakness, diminished facial sensation, facial droop Negative: Fevers, recent illness, headache, dizziness, blurred vision  Physical Exam  BP 135/65 (BP Location: Left Arm)   Pulse 77   Temp 98.1 F (36.7 C) (Oral)   Resp 19   SpO2 100%  Gen:   Awake, no distress   Resp:  Normal effort  Neuro:              Reduced strength in left arm and leg.  Diminished sensation left side of body.  Left sided asymmetric smile / facial asymmetry.  Medical Decision Making  Medically screening exam initiated at 7:07 PM.  Appropriate orders placed.  Carolyn Russo was informed that the remainder of the evaluation will be completed by another provider, this initial triage assessment does not replace that evaluation, and the importance of remaining in the ED until their evaluation is complete.  Stroke work-up ordered.   Corena Herter, PA-C 04/14/21 Joanne Gavel, MD 04/14/21 2119

## 2021-04-14 NOTE — Chronic Care Management (AMB) (Signed)
  Chronic Care Management   Note  04/14/2021 Name: Carolyn Russo MRN: [id number] DOB: January 18, 1951  Carolyn Russo is a 70 y.o. year old female who is a primary care patient of [name], MD. Carolyn Russo is currently enrolled in care management services. An additional referral for Pharmacy was placed.   Follow up plan: Telephone appointment with care management team member scheduled for: 04/27/2021  [facility]

## 2021-04-15 ENCOUNTER — Other Ambulatory Visit: Payer: Self-pay | Admitting: Internal Medicine

## 2021-04-15 ENCOUNTER — Ambulatory Visit: Payer: Medicare Other

## 2021-04-15 DIAGNOSIS — R4781 Slurred speech: Secondary | ICD-10-CM

## 2021-04-15 DIAGNOSIS — N185 Chronic kidney disease, stage 5: Secondary | ICD-10-CM

## 2021-04-15 DIAGNOSIS — I5033 Acute on chronic diastolic (congestive) heart failure: Secondary | ICD-10-CM | POA: Diagnosis not present

## 2021-04-15 DIAGNOSIS — I160 Hypertensive urgency: Secondary | ICD-10-CM | POA: Diagnosis not present

## 2021-04-15 DIAGNOSIS — E1165 Type 2 diabetes mellitus with hyperglycemia: Secondary | ICD-10-CM

## 2021-04-15 DIAGNOSIS — N186 End stage renal disease: Secondary | ICD-10-CM | POA: Diagnosis not present

## 2021-04-15 DIAGNOSIS — E1122 Type 2 diabetes mellitus with diabetic chronic kidney disease: Secondary | ICD-10-CM | POA: Diagnosis not present

## 2021-04-15 DIAGNOSIS — J810 Acute pulmonary edema: Secondary | ICD-10-CM

## 2021-04-15 DIAGNOSIS — D631 Anemia in chronic kidney disease: Secondary | ICD-10-CM | POA: Diagnosis not present

## 2021-04-15 DIAGNOSIS — I132 Hypertensive heart and chronic kidney disease with heart failure and with stage 5 chronic kidney disease, or end stage renal disease: Secondary | ICD-10-CM | POA: Diagnosis not present

## 2021-04-15 NOTE — Chronic Care Management (AMB) (Signed)
Social Work Note  04/15/2021 Name: INFANT DOANE MRN: [id number] DOB: 11-Jun-1951  Kaytie F Hoggard is a 70 y.o. year old female who is a primary care patient of [name], MD.  The Care Management team was consulted for assistance with chronic disease management and care coordination needs.  Ms. Stencil was given information about Care Management services today including:  Care Management services include personalized support from designated clinical staff supervised by her physician, including individualized plan of care and coordination with other care providers 24/7 contact phone numbers for assistance for urgent and routine care needs. The patient may stop care management services at any time (effective at the end of the month) by phone call to the office staff.  Patient agreed to services and consent obtained.   Engaged with patient spouse by phone  for follow up visit in response to provider referral for social work chronic care management and care coordination services.  Assessment: Review of patient past medical history, allergies, medications, and health status, including review of pertinent consultant reports was performed as part of comprehensive evaluation and provision of care management/care coordination services.   SDOH (Social Determinants of Health) assessments and interventions performed:  No    Advanced Directives Status: Not addressed in this encounter.  Care Plan  Allergies  Allergen Reactions   Food Anaphylaxis    Peanuts; Almonds   Other Shortness Of Breath    Peanuts; Almonds   Wheat Bran Shortness Of Breath   Statins Itching and Other (See Comments)    Generalized aches   Iodine Other (See Comments)    The patient denied this allergy to me on 03/16/2021   Shellfish Allergy Other (See Comments)    Mouth gets raw   Sitagliptin     Other reaction(s): Unknown   Tetracycline Other (See Comments)    Raw mouth   Tetracyclines & Related Itching    Contrast Media [Iodinated Diagnostic Agents] Rash    Happened during CT scan over 30 years ago    Outpatient Encounter Medications as of 04/15/2021  Medication Sig   albuterol (PROVENTIL) (2.5 MG/3ML) 0.083% nebulizer solution Take 3 mLs (2.5 mg total) by nebulization every 4 (four) hours as needed for wheezing or shortness of breath.   albuterol (VENTOLIN HFA) 108 (90 Base) MCG/ACT inhaler Inhale 2 puffs into the lungs every 4 (four) hours as needed for wheezing or shortness of breath.   aspirin 81 MG tablet Take 81 mg by mouth daily.   b complex vitamins capsule Take 1 capsule by mouth daily.   BESIVANCE 0.6 % SUSP Place 1 drop into both eyes See admin instructions. After  eye injections   calcitRIOL (ROCALTROL) 0.25 MCG capsule Take 0.25 mcg by mouth 3 (three) times a week.   Caraway Oil-Levomenthol (FDGARD PO) Take 1 tablet by mouth 2 (two) times daily as needed (nausea).   carvedilol (COREG) 3.125 MG tablet Take 1 tablet (3.125 mg total) by mouth 2 (two) times daily with a meal.   Cholecalciferol (VITAMIN D3 PO) Take 2,000 Units by mouth daily.   Continuous Blood Gluc Receiver (DEXCOM G6 RECEIVER) DEVI Use as directed to check blood sugars 3 times per day dx: e11.65   Continuous Blood Gluc Sensor (DEXCOM G6 SENSOR) MISC Use as directed to check blood sugars 3 times per day dx: e11.65   Continuous Blood Gluc Transmit (DEXCOM G6 TRANSMITTER) MISC Use as directed to check blood sugars 3 times per day dx: e11.65   docusate sodium (COLACE)  100 MG capsule Take 100 mg by mouth daily as needed for mild constipation.   fluticasone (FLONASE) 50 MCG/ACT nasal spray Place 2 sprays into the nose daily as needed for allergies.    furosemide (LASIX) 40 MG tablet Take 1-2 tablets (40-80 mg total) by mouth every Monday, Wednesday, and Friday. 80 mg twice a week. 40 mg all other days (Patient not taking: Reported on 04/14/2021)   furosemide (LASIX) 80 MG tablet Tuesday Thursday saturday   hydrocortisone  (ANUSOL-HC) 2.5 % rectal cream Place 1 application rectally 2 (two) times daily.   insulin aspart protamine - aspart (NOVOLOG MIX 70/30 FLEXPEN) (70-30) 100 UNIT/ML FlexPen Inject 1 mL (100 Units total) into the skin 2 (two) times daily with a meal. 20 units in the morning and 15 units at night   iron polysaccharides (NIFEREX) 150 MG capsule Take 1 capsule (150 mg total) by mouth daily. (Patient not taking: Reported on 04/14/2021)   lactobacillus acidophilus (BACID) TABS Take 1 tablet by mouth daily. PROBIOTIC (Patient not taking: Reported on 04/14/2021)   magnesium oxide (MAG-OX) 400 (241.3 Mg) MG tablet Take 400 mg by mouth daily.   mometasone-formoterol (DULERA) 200-5 MCG/ACT AERO Inhale 2 puffs into the lungs 2 (two) times daily.   ondansetron (ZOFRAN) 4 MG tablet Take 1 tablet (4 mg total) by mouth every 6 (six) hours as needed for nausea or vomiting.   OneTouch Delica Lancets 39J MISC Use as directed to check blood sugars before breakfast and dinner dx: e11.65   polyethylene glycol (MIRALAX / GLYCOLAX) 17 g packet Take 17 g by mouth daily. (Patient taking differently: Take 17 g by mouth daily as needed for mild constipation.)   Polyvinyl Alcohol-Povidone (REFRESH OP) Place 1 drop into both eyes daily as needed (dry eyes). (Patient not taking: Reported on 04/14/2021)   rosuvastatin (CRESTOR) 20 MG tablet Take 1 tablet (20 mg total) by mouth daily.   sevelamer carbonate (RENVELA) 800 MG tablet Take 1 tablet (800 mg total) by mouth 3 (three) times daily with meals.   SYMBICORT 160-4.5 MCG/ACT inhaler Inhale 2 puffs into the lungs 2 (two) times daily.   traMADol (ULTRAM) 50 MG tablet Take 1 tablet (50 mg total) by mouth every 12 (twelve) hours as needed for severe pain.   vitamin C (ASCORBIC ACID) 500 MG tablet Take 500 mg by mouth daily.   No facility-administered encounter medications on file as of 04/15/2021.    Patient Active Problem List   Diagnosis Date Noted   Hypertensive urgency  03/10/2021   Hilar enlargement    Symptomatic anemia    Acute on chronic diastolic CHF (congestive heart failure) ([place]) 03/09/2021   CKD (chronic kidney disease), stage V ([place]) 03/08/2021   Diabetic retinopathy ([place]) 03/08/2021   Hyperglycemia due to type 2 diabetes mellitus ([place]) 03/08/2021   Pure hypercholesterolemia 03/08/2021   Anemia in chronic kidney disease 09/24/2020   Uncontrolled type 2 diabetes mellitus with hyperglycemia ([place]) 03/19/2019   Osteopenia 09/04/2018   Migraine 09/04/2018   Asthma 09/04/2018   Pseudophakia of both eyes 07/17/2018   Pseudophakia of left eye 07/03/2018   Open angle with borderline findings and high glaucoma risk in left eye 07/03/2018   Age-related nuclear cataract of right eye 07/03/2018   CAD (coronary artery disease), native coronary artery 04/27/2017   Abnormal nuclear stress test 04/04/2017   Dyspnea on exertion 03/09/2017   Appendicitis    Age-related hypermature cataract of both eyes 12/20/2016   Uterine leiomyoma 11/27/2012   Dyslipidemia (  high LDL; low HDL) 11/25/2011   Obesity (BMI 30-39.9) 11/25/2011   Hypertensive disorder 11/21/2011   Type 2 diabetes mellitus ([place]) 11/21/2011   Abnormal EKG 11/21/2011    Conditions to be addressed/monitored:  Acute Pulmonary Edema, Acute on Chronic Diastolic CHF, CKD Stage V, DM II  Care Plan : Social Work [facility] Plan of Care  Updates made by Daneen Schick since 04/15/2021 12:00 AM     Problem: Disease Progression      Long-Range Goal: Disease Progression Managed   Start Date: 03/30/2021  Expected End Date: 07/28/2021  This Visit's Progress: On track  Priority: High  Note:   Current Barriers:  Chronic disease management support and education needs related to  Acute Pulmonary Edema, Acute on Chronic Diastolic CHF, CKD Stage V, and DM II   Recent 15 day inpatient day Limited knowledge of resources to assist with patient care needs ADL/iADL limitations  Social Worker Clinical Goal(s):   patient will work with SW to identify and address any acute and/or chronic care coordination needs related to the self health management of  Acute Pulmonary Edema, Acute on Chronic Diastolic CHF, CKD Stage V, DM II   Patient will work with SW to become more knowledgeable of transportation resources to assist with transportation home from Dialysis Patient will work with SW to identify caregiver resources to assist with patient care needs Patient will engage with Consulting civil engineer to develop an individualized plan of care to address clinical care management needs  SW Interventions:  Inter-disciplinary care team collaboration (see longitudinal plan of care) Collaboration with [name], MD regarding development and update of comprehensive plan of care as evidenced by provider attestation and co-signature Successful outbound call placed to the patient spouse, Shashana Fullington, to assess goal progression Confirmed receipt of resource previously provided identifying local caregiver agencies Discussed Mr. Bartelson has been contacting local agencies to determine availability and obtain hourly rates Reviewed opportunity to hire a private caregiver at a cheaper rate Discussed barriers in locating private duty caregivers in the area Advised Mr. Markoff SW would Camera operator of [place] to determine if they have a Runner, broadcasting/film/video with Baker Janus from [employer] of [employer] via e-mail correspondence to determine if such resource is available Performed chart review to note patient seen in the ED on 6.22.22 for concern with facial asymmetry  Mr. Art reports after 7 hours in the ED he took the patient home AMA due to being exhausted - Mr. Guajardo states he did review MyChart this morning and CT scan did not show sign of stroke Discussed the patient is still experiencing some facial asymmetry and left arm pain/weakness but is no longer having difficulty with gait. Mr. Gassett reports  the patient did attend dialysis yesterday morning and an appointment with vascular specialist so this may have contributed to unsteady gait due to level of exhaustion  Collaboration with [place] to inform of recent ED visit and patient current dispostion  Patient Goals/Self-Care Activities patient will:   -  Contact primary care provider as needed  -Review resources provided by [place] with [place] -Participate with home health services -Contact SW as needed prior to next scheduled call  Follow Up Plan:  SW will contact the patient over the next 21 days       Follow Up Plan: SW will follow up with patient by phone over the next 14 days      [place], CDP Social Worker,  Certified Dementia Practitioner [facility] [phone]

## 2021-04-15 NOTE — ED Notes (Signed)
Pt leaving AMA, family stated they would return if symptoms worsen.

## 2021-04-15 NOTE — Patient Instructions (Signed)
Visit Information   Goals Addressed             This Visit's Progress    Disease Progression Managed       Timeframe:  Long-Range Goal Priority:  High Start Date:  6.7.22                           Expected End Date: 10.5.22                      Next planned outreach: 7.7.22  Patient Goals/Self-Care Activities patient will:   - Contact primary care provider as needed -Review resources provided by [place] with [place] with home health services -Contact SW as needed prior to next scheduled call         Patient verbalizes understanding of instructions provided today and agrees to view in [place].   SW will follow up with the patient over the next 14 days  Daneen Schick, BSW, CDP Social Worker, Certified Dementia Practitioner [facility] [facility] [phone]

## 2021-04-16 ENCOUNTER — Inpatient Hospital Stay (HOSPITAL_COMMUNITY): Payer: Medicare Other

## 2021-04-16 ENCOUNTER — Inpatient Hospital Stay (HOSPITAL_COMMUNITY)
Admission: EM | Admit: 2021-04-16 | Discharge: 2021-04-20 | DRG: 064 | Disposition: A | Discharge status: Home / Self Care | Payer: Medicare Other | Attending: Internal Medicine | Admitting: Internal Medicine

## 2021-04-16 ENCOUNTER — Encounter: Payer: Self-pay | Admitting: Internal Medicine

## 2021-04-16 ENCOUNTER — Ambulatory Visit: Payer: Medicare Other

## 2021-04-16 ENCOUNTER — Encounter (HOSPITAL_COMMUNITY): Payer: Self-pay

## 2021-04-16 ENCOUNTER — Other Ambulatory Visit: Payer: Self-pay

## 2021-04-16 ENCOUNTER — Other Ambulatory Visit (HOSPITAL_COMMUNITY): Payer: Medicare Other

## 2021-04-16 DIAGNOSIS — R414 Neurologic neglect syndrome: Secondary | ICD-10-CM | POA: Diagnosis not present

## 2021-04-16 DIAGNOSIS — E669 Obesity, unspecified: Secondary | ICD-10-CM | POA: Diagnosis present

## 2021-04-16 DIAGNOSIS — D72829 Elevated white blood cell count, unspecified: Secondary | ICD-10-CM | POA: Diagnosis present

## 2021-04-16 DIAGNOSIS — K5900 Constipation, unspecified: Secondary | ICD-10-CM | POA: Diagnosis present

## 2021-04-16 DIAGNOSIS — K5901 Slow transit constipation: Secondary | ICD-10-CM | POA: Diagnosis not present

## 2021-04-16 DIAGNOSIS — N1832 Chronic kidney disease, stage 3b: Secondary | ICD-10-CM | POA: Diagnosis not present

## 2021-04-16 DIAGNOSIS — I491 Atrial premature depolarization: Secondary | ICD-10-CM | POA: Diagnosis not present

## 2021-04-16 DIAGNOSIS — I517 Cardiomegaly: Secondary | ICD-10-CM | POA: Diagnosis not present

## 2021-04-16 DIAGNOSIS — E1122 Type 2 diabetes mellitus with diabetic chronic kidney disease: Secondary | ICD-10-CM | POA: Diagnosis not present

## 2021-04-16 DIAGNOSIS — Z833 Family history of diabetes mellitus: Secondary | ICD-10-CM

## 2021-04-16 DIAGNOSIS — G8194 Hemiplegia, unspecified affecting left nondominant side: Secondary | ICD-10-CM | POA: Diagnosis present

## 2021-04-16 DIAGNOSIS — R4781 Slurred speech: Secondary | ICD-10-CM

## 2021-04-16 DIAGNOSIS — Z7951 Long term (current) use of inhaled steroids: Secondary | ICD-10-CM

## 2021-04-16 DIAGNOSIS — F05 Delirium due to known physiological condition: Secondary | ICD-10-CM | POA: Diagnosis not present

## 2021-04-16 DIAGNOSIS — I639 Cerebral infarction, unspecified: Secondary | ICD-10-CM

## 2021-04-16 DIAGNOSIS — Z794 Long term (current) use of insulin: Secondary | ICD-10-CM

## 2021-04-16 DIAGNOSIS — Z992 Dependence on renal dialysis: Secondary | ICD-10-CM | POA: Diagnosis not present

## 2021-04-16 DIAGNOSIS — M898X9 Other specified disorders of bone, unspecified site: Secondary | ICD-10-CM | POA: Diagnosis present

## 2021-04-16 DIAGNOSIS — Z7982 Long term (current) use of aspirin: Secondary | ICD-10-CM

## 2021-04-16 DIAGNOSIS — Z91013 Allergy to seafood: Secondary | ICD-10-CM

## 2021-04-16 DIAGNOSIS — J9811 Atelectasis: Secondary | ICD-10-CM | POA: Diagnosis not present

## 2021-04-16 DIAGNOSIS — D509 Iron deficiency anemia, unspecified: Secondary | ICD-10-CM | POA: Diagnosis present

## 2021-04-16 DIAGNOSIS — D62 Acute posthemorrhagic anemia: Secondary | ICD-10-CM | POA: Diagnosis not present

## 2021-04-16 DIAGNOSIS — I5033 Acute on chronic diastolic (congestive) heart failure: Secondary | ICD-10-CM

## 2021-04-16 DIAGNOSIS — I63039 Cerebral infarction due to thrombosis of unspecified carotid artery: Secondary | ICD-10-CM | POA: Diagnosis not present

## 2021-04-16 DIAGNOSIS — K649 Unspecified hemorrhoids: Secondary | ICD-10-CM

## 2021-04-16 DIAGNOSIS — D638 Anemia in other chronic diseases classified elsewhere: Secondary | ICD-10-CM | POA: Diagnosis present

## 2021-04-16 DIAGNOSIS — Z881 Allergy status to other antibiotic agents status: Secondary | ICD-10-CM | POA: Diagnosis not present

## 2021-04-16 DIAGNOSIS — Z91041 Radiographic dye allergy status: Secondary | ICD-10-CM

## 2021-04-16 DIAGNOSIS — K648 Other hemorrhoids: Secondary | ICD-10-CM | POA: Diagnosis present

## 2021-04-16 DIAGNOSIS — I132 Hypertensive heart and chronic kidney disease with heart failure and with stage 5 chronic kidney disease, or end stage renal disease: Secondary | ICD-10-CM | POA: Diagnosis not present

## 2021-04-16 DIAGNOSIS — J45909 Unspecified asthma, uncomplicated: Secondary | ICD-10-CM | POA: Diagnosis present

## 2021-04-16 DIAGNOSIS — I2510 Atherosclerotic heart disease of native coronary artery without angina pectoris: Secondary | ICD-10-CM | POA: Diagnosis present

## 2021-04-16 DIAGNOSIS — H53451 Other localized visual field defect, right eye: Secondary | ICD-10-CM | POA: Diagnosis not present

## 2021-04-16 DIAGNOSIS — E119 Type 2 diabetes mellitus without complications: Secondary | ICD-10-CM

## 2021-04-16 DIAGNOSIS — I10 Essential (primary) hypertension: Secondary | ICD-10-CM | POA: Diagnosis not present

## 2021-04-16 DIAGNOSIS — Z6833 Body mass index (BMI) 33.0-33.9, adult: Secondary | ICD-10-CM | POA: Diagnosis not present

## 2021-04-16 DIAGNOSIS — R404 Transient alteration of awareness: Secondary | ICD-10-CM | POA: Diagnosis not present

## 2021-04-16 DIAGNOSIS — E785 Hyperlipidemia, unspecified: Secondary | ICD-10-CM

## 2021-04-16 DIAGNOSIS — N2581 Secondary hyperparathyroidism of renal origin: Secondary | ICD-10-CM | POA: Diagnosis present

## 2021-04-16 DIAGNOSIS — E1169 Type 2 diabetes mellitus with other specified complication: Secondary | ICD-10-CM | POA: Diagnosis not present

## 2021-04-16 DIAGNOSIS — K921 Melena: Secondary | ICD-10-CM | POA: Diagnosis not present

## 2021-04-16 DIAGNOSIS — K644 Residual hemorrhoidal skin tags: Secondary | ICD-10-CM | POA: Diagnosis present

## 2021-04-16 DIAGNOSIS — M7061 Trochanteric bursitis, right hip: Secondary | ICD-10-CM | POA: Diagnosis not present

## 2021-04-16 DIAGNOSIS — D6832 Hemorrhagic disorder due to extrinsic circulating anticoagulants: Secondary | ICD-10-CM | POA: Diagnosis not present

## 2021-04-16 DIAGNOSIS — I959 Hypotension, unspecified: Secondary | ICD-10-CM | POA: Diagnosis not present

## 2021-04-16 DIAGNOSIS — D631 Anemia in chronic kidney disease: Secondary | ICD-10-CM | POA: Diagnosis present

## 2021-04-16 DIAGNOSIS — I509 Heart failure, unspecified: Secondary | ICD-10-CM | POA: Diagnosis not present

## 2021-04-16 DIAGNOSIS — R9431 Abnormal electrocardiogram [ECG] [EKG]: Secondary | ICD-10-CM | POA: Diagnosis not present

## 2021-04-16 DIAGNOSIS — R531 Weakness: Secondary | ICD-10-CM | POA: Diagnosis not present

## 2021-04-16 DIAGNOSIS — E1165 Type 2 diabetes mellitus with hyperglycemia: Secondary | ICD-10-CM

## 2021-04-16 DIAGNOSIS — I69354 Hemiplegia and hemiparesis following cerebral infarction affecting left non-dominant side: Secondary | ICD-10-CM | POA: Diagnosis present

## 2021-04-16 DIAGNOSIS — K922 Gastrointestinal hemorrhage, unspecified: Secondary | ICD-10-CM | POA: Diagnosis not present

## 2021-04-16 DIAGNOSIS — Z91018 Allergy to other foods: Secondary | ICD-10-CM

## 2021-04-16 DIAGNOSIS — J810 Acute pulmonary edema: Secondary | ICD-10-CM

## 2021-04-16 DIAGNOSIS — Z79899 Other long term (current) drug therapy: Secondary | ICD-10-CM

## 2021-04-16 DIAGNOSIS — Z808 Family history of malignant neoplasm of other organs or systems: Secondary | ICD-10-CM

## 2021-04-16 DIAGNOSIS — R29810 Facial weakness: Secondary | ICD-10-CM | POA: Diagnosis present

## 2021-04-16 DIAGNOSIS — H2523 Age-related cataract, morgagnian type, bilateral: Secondary | ICD-10-CM | POA: Diagnosis present

## 2021-04-16 DIAGNOSIS — I6381 Other cerebral infarction due to occlusion or stenosis of small artery: Secondary | ICD-10-CM | POA: Diagnosis present

## 2021-04-16 DIAGNOSIS — N186 End stage renal disease: Secondary | ICD-10-CM | POA: Diagnosis present

## 2021-04-16 DIAGNOSIS — Z20822 Contact with and (suspected) exposure to covid-19: Secondary | ICD-10-CM | POA: Diagnosis present

## 2021-04-16 DIAGNOSIS — N185 Chronic kidney disease, stage 5: Secondary | ICD-10-CM

## 2021-04-16 DIAGNOSIS — Z888 Allergy status to other drugs, medicaments and biological substances status: Secondary | ICD-10-CM

## 2021-04-16 DIAGNOSIS — M7071 Other bursitis of hip, right hip: Secondary | ICD-10-CM | POA: Diagnosis present

## 2021-04-16 DIAGNOSIS — G479 Sleep disorder, unspecified: Secondary | ICD-10-CM | POA: Diagnosis not present

## 2021-04-16 DIAGNOSIS — I5032 Chronic diastolic (congestive) heart failure: Secondary | ICD-10-CM | POA: Diagnosis not present

## 2021-04-16 DIAGNOSIS — E1136 Type 2 diabetes mellitus with diabetic cataract: Secondary | ICD-10-CM | POA: Diagnosis not present

## 2021-04-16 DIAGNOSIS — R112 Nausea with vomiting, unspecified: Secondary | ICD-10-CM | POA: Diagnosis present

## 2021-04-16 DIAGNOSIS — R569 Unspecified convulsions: Secondary | ICD-10-CM | POA: Diagnosis present

## 2021-04-16 DIAGNOSIS — I160 Hypertensive urgency: Secondary | ICD-10-CM

## 2021-04-16 DIAGNOSIS — G8190 Hemiplegia, unspecified affecting unspecified side: Secondary | ICD-10-CM | POA: Diagnosis not present

## 2021-04-16 DIAGNOSIS — Z820 Family history of epilepsy and other diseases of the nervous system: Secondary | ICD-10-CM

## 2021-04-16 DIAGNOSIS — G459 Transient cerebral ischemic attack, unspecified: Secondary | ICD-10-CM | POA: Diagnosis not present

## 2021-04-16 DIAGNOSIS — Z6834 Body mass index (BMI) 34.0-34.9, adult: Secondary | ICD-10-CM | POA: Diagnosis not present

## 2021-04-16 DIAGNOSIS — D72825 Bandemia: Secondary | ICD-10-CM | POA: Diagnosis present

## 2021-04-16 DIAGNOSIS — E1129 Type 2 diabetes mellitus with other diabetic kidney complication: Secondary | ICD-10-CM | POA: Diagnosis not present

## 2021-04-16 DIAGNOSIS — Z91010 Allergy to peanuts: Secondary | ICD-10-CM

## 2021-04-16 LAB — LIPID PANEL
Cholesterol: 135 mg/dL (ref 0–200)
HDL: 47 mg/dL (ref >40)
LDL Cholesterol: 70 mg/dL (ref 0–99)
Total CHOL/HDL Ratio: 2.9 RATIO
Triglycerides: 89 mg/dL (ref <150)
VLDL: 18 mg/dL (ref 0–40)

## 2021-04-16 LAB — CBC WITH DIFFERENTIAL/PLATELET
Abs Immature Granulocytes: 0.03 10*3/uL (ref 0.00–0.07)
Basophils Absolute: 0.1 10*3/uL (ref 0.0–0.1)
Basophils Relative: 1 %
Eosinophils Absolute: 0.1 10*3/uL (ref 0.0–0.5)
Eosinophils Relative: 1 %
HCT: 27.3 % — ABNORMAL LOW (ref 36.0–46.0)
Hemoglobin: 9 g/dL — ABNORMAL LOW (ref 12.0–15.0)
Immature Granulocytes: 0 %
Lymphocytes Relative: 37 %
Lymphs Abs: 3.4 10*3/uL (ref 0.7–4.0)
MCH: 29 pg (ref 26.0–34.0)
MCHC: 33 g/dL (ref 30.0–36.0)
MCV: 88.1 fL (ref 80.0–100.0)
Monocytes Absolute: 1.3 10*3/uL — ABNORMAL HIGH (ref 0.1–1.0)
Monocytes Relative: 14 %
Neutro Abs: 4.2 10*3/uL (ref 1.7–7.7)
Neutrophils Relative %: 47 %
Platelets: 282 10*3/uL (ref 150–400)
RBC: 3.1 MIL/uL — ABNORMAL LOW (ref 3.87–5.11)
RDW: 16.1 % — ABNORMAL HIGH (ref 11.5–15.5)
WBC: 9 10*3/uL (ref 4.0–10.5)
nRBC: 0 % (ref 0.0–0.2)

## 2021-04-16 LAB — BASIC METABOLIC PANEL
Anion gap: 6 (ref 5–15)
BUN: 12 mg/dL (ref 8–23)
CO2: 34 mmol/L — ABNORMAL HIGH (ref 22–32)
Calcium: 8.2 mg/dL — ABNORMAL LOW (ref 8.9–10.3)
Chloride: 96 mmol/L — ABNORMAL LOW (ref 98–111)
Creatinine, Ser: 3.02 mg/dL — ABNORMAL HIGH (ref 0.44–1.00)
GFR, Estimated: 16 mL/min — ABNORMAL LOW (ref >60)
Glucose, Bld: 192 mg/dL — ABNORMAL HIGH (ref 70–99)
Potassium: 4 mmol/L (ref 3.5–5.1)
Sodium: 136 mmol/L (ref 135–145)

## 2021-04-16 LAB — RESP PANEL BY RT-PCR (FLU A&B, COVID) ARPGX2
Influenza A by PCR: NEGATIVE
Influenza B by PCR: NEGATIVE
SARS Coronavirus 2 by RT PCR: NEGATIVE

## 2021-04-16 LAB — HEMOGLOBIN A1C
Hgb A1c MFr Bld: 5.2 % (ref 4.8–5.6)
Mean Plasma Glucose: 102.54 mg/dL

## 2021-04-16 LAB — GLUCOSE, CAPILLARY: Glucose-Capillary: 139 mg/dL — ABNORMAL HIGH (ref 70–99)

## 2021-04-16 IMAGING — MR MR HEAD W/O CM
8 of 10 series · 36 of 48 positions shown · non-contrast
Comparison: Prior head CT from [DATE].

CLINICAL DATA: Initial evaluation for acute stroke.

EXAM:
MRI HEAD WITHOUT CONTRAST
TECHNIQUE: Multiplanar, multiecho pulse sequences of the brain and surrounding
structures were obtained without intravenous contrast.

[Series 3: DWI · axial · 3.0mm · 1.09mm/px · z∈[-23,+117]mm · 9 of 98 slices shown (1 of 4)]
[im 1/98]
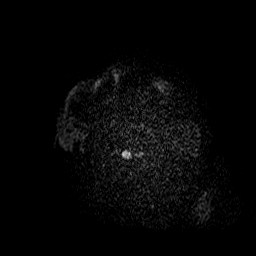
[im 13/98]
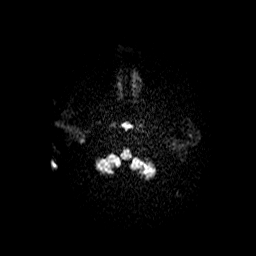
[im 25/98]
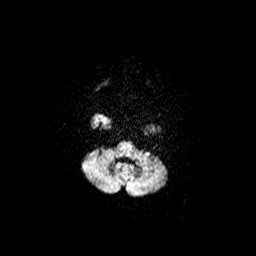
[im 37/98]
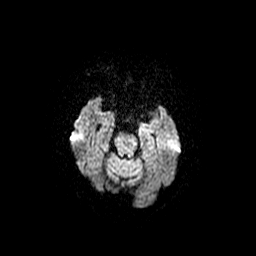
[im 49/98]
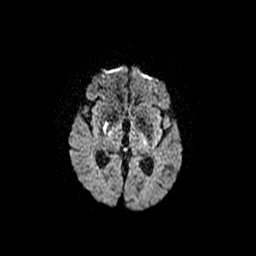
[im 61/98]
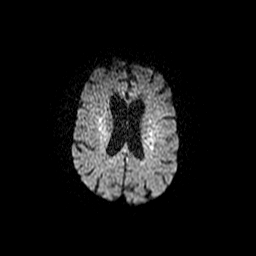
[im 73/98]
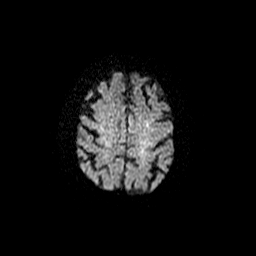
[im 85/98]
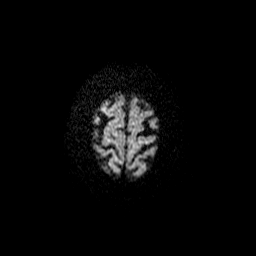
[im 98/98]
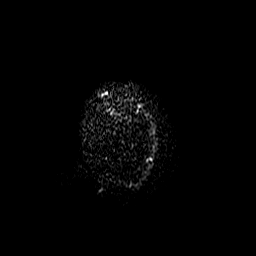

[Series 4: DWI · coronal · 5.0mm · 1.09mm/px · 7 of 70 slices shown (2 of 4)]
[im 1/70]
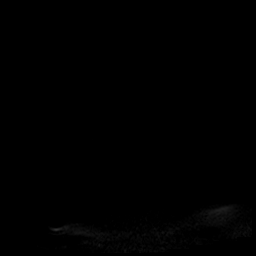
[im 12/70]
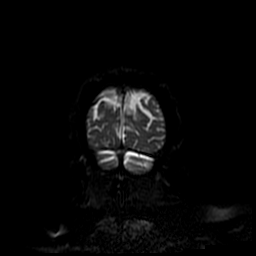
[im 24/70]
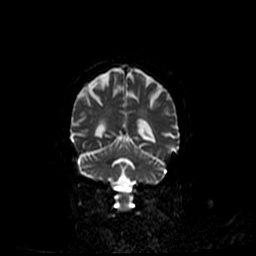
[im 35/70]
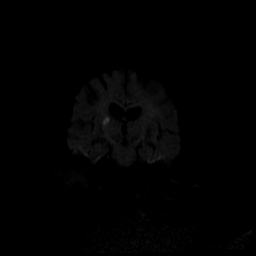
[im 47/70]
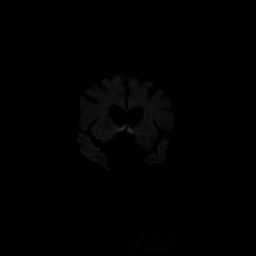
[im 58/70]
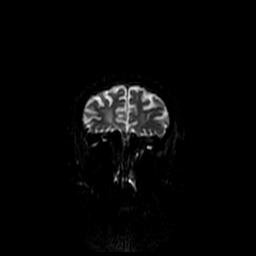
[im 70/70]
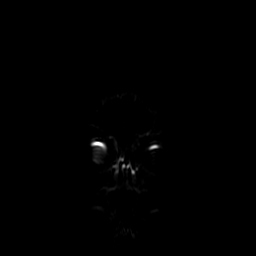

[Series 5: T1 · sagittal · 5.0mm · 0.47mm/px · 2 of 23 slices shown]
[im 1/23]
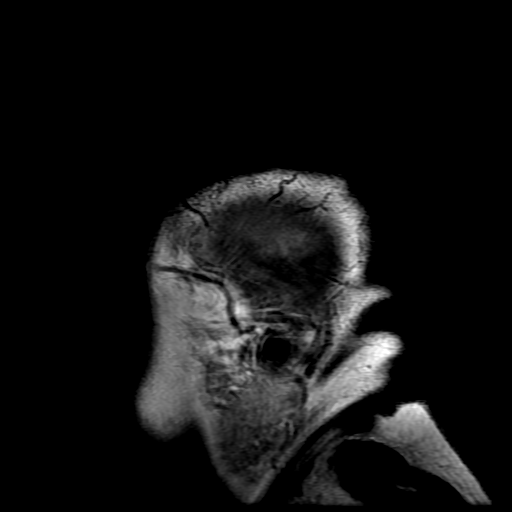
[im 23/23]
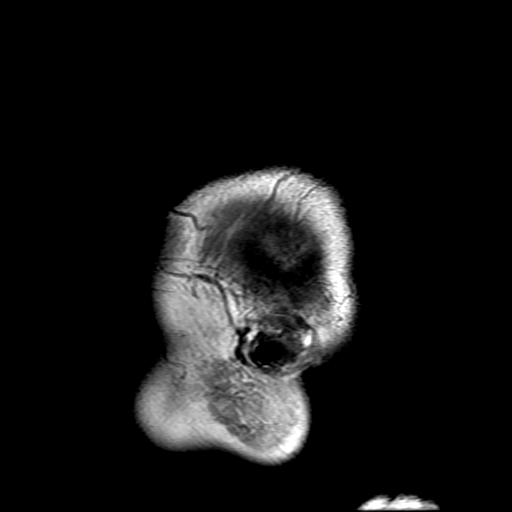

[Series 6: T2 · axial · 5.0mm · 0.43mm/px · z∈[-33,+107]mm · 3 of 25 slices shown (1 of 2)]
[im 1/25]
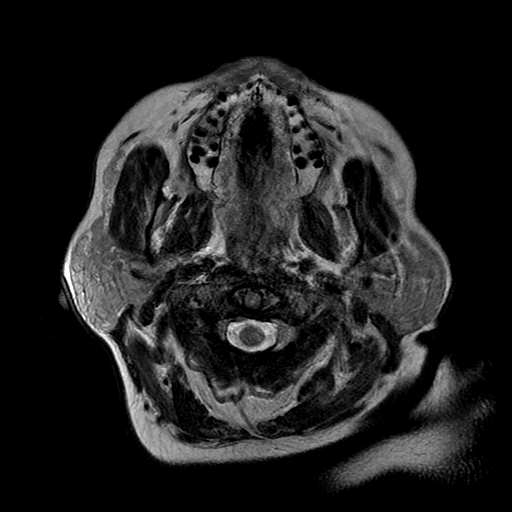
[im 13/25]
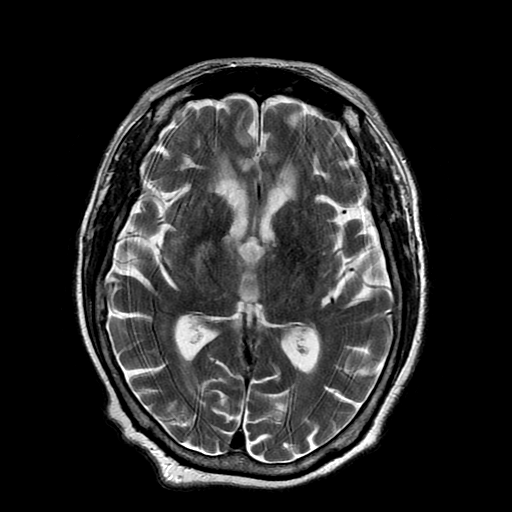
[im 25/25]
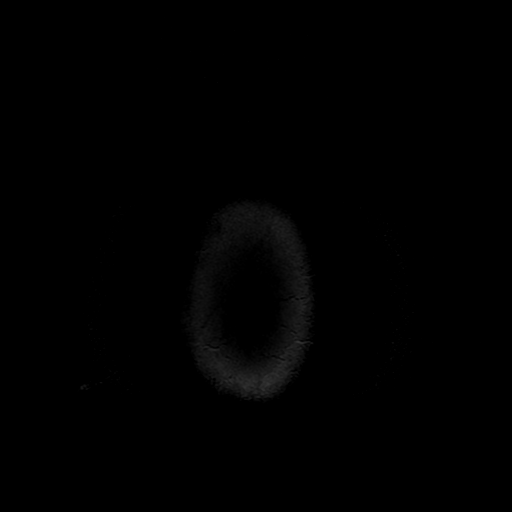

[Series 7: FLAIR · axial · 3.0mm · 0.43mm/px · z∈[-33,+107]mm · 3 of 25 slices shown]
[im 1/25]
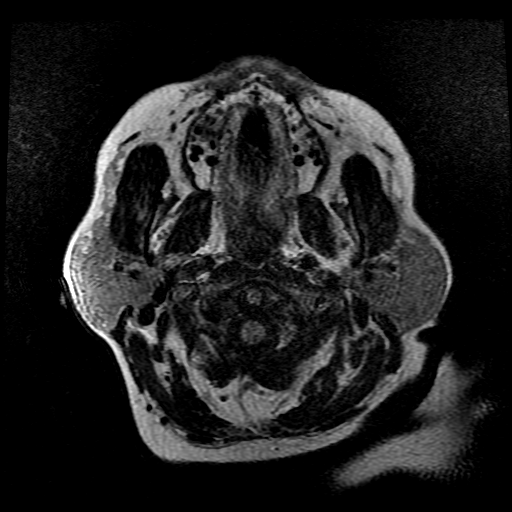
[im 13/25]
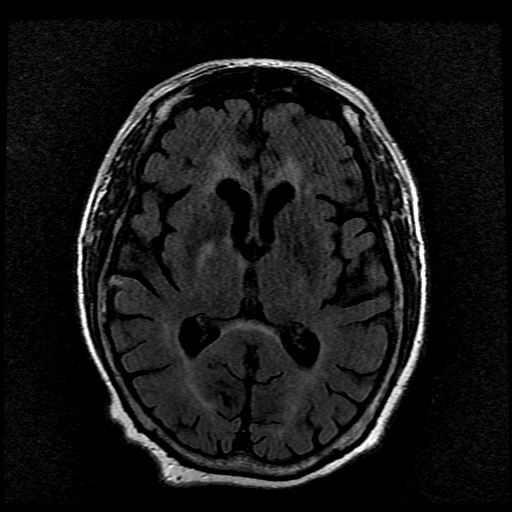
[im 25/25]
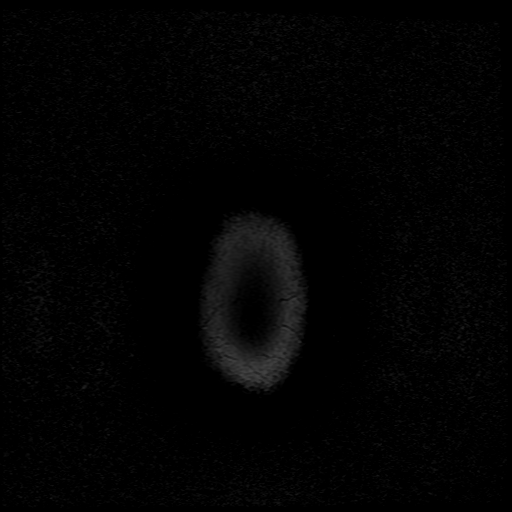

[Series 10: T2 · coronal · 5.0mm · 0.39mm/px · 3 of 27 slices shown (2 of 2)]
[im 1/27]
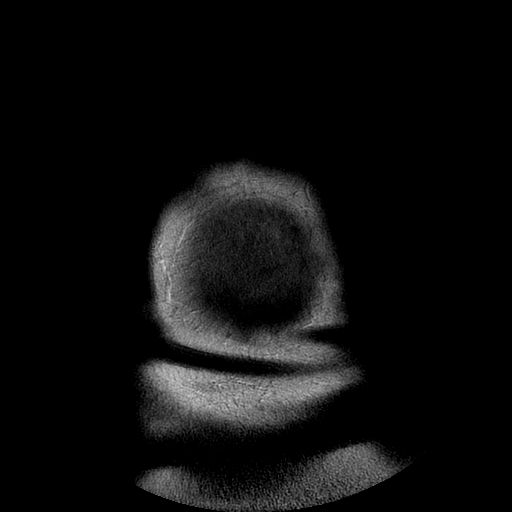
[im 14/27]
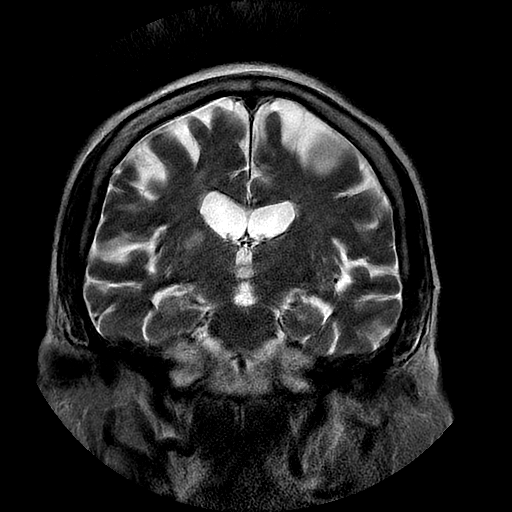
[im 27/27]
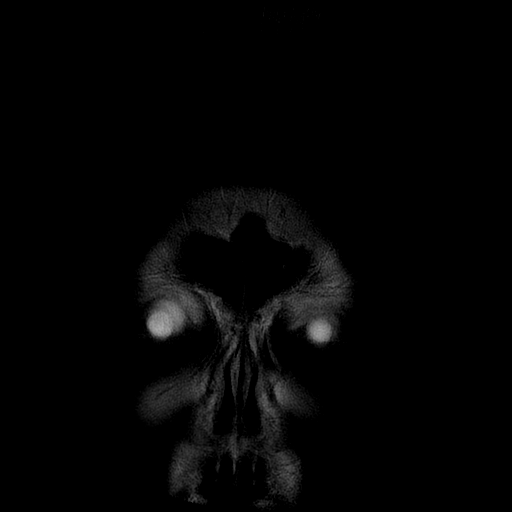

[Series 300: DWI · axial · 3.0mm · 1.09mm/px · z∈[-23,+117]mm · 5 of 49 slices shown (3 of 4)]
[im 1/49]
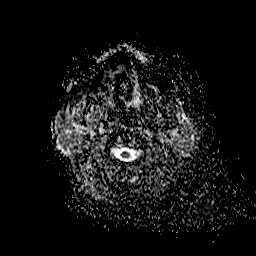
[im 13/49]
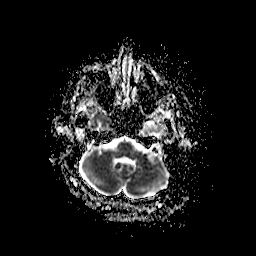
[im 25/49]
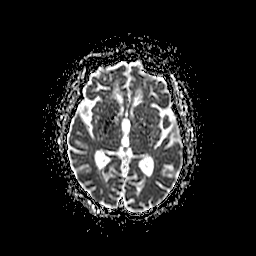
[im 37/49]
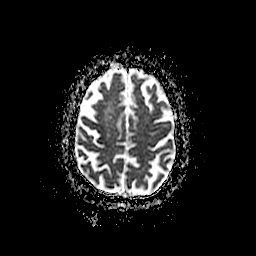
[im 49/49]
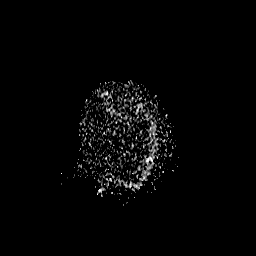

[Series 400: DWI · coronal · 5.0mm · 1.09mm/px · 4 of 35 slices shown (4 of 4)]
[im 1/35]
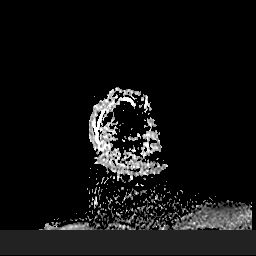
[im 12/35]
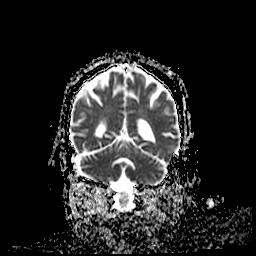
[im 23/35]
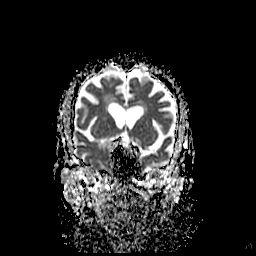
[im 35/35]
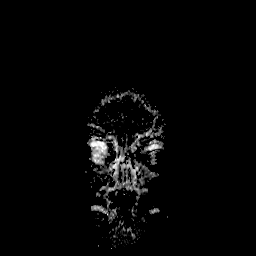

[36 of 48 positions shown; findings below may reference images not displayed]

FINDINGS: Brain: Cerebral volume within normal limits for age. Patchy and hazy
T2/FLAIR hyperintensity within the periventricular deep white matter
both cerebral hemispheres most consistent with chronic small vessel
ischemic disease, moderately advanced in nature.

2.1 cm focus of restricted diffusion seen involving the right basal
ganglia, consistent with an acute ischemic infarct. No associated
hemorrhage or mass effect. No other evidence for acute or subacute
ischemia. Gray-white matter differentiation otherwise maintained. No
other areas of acute or subacute infarction. No encephalomalacia to
suggest chronic cortical infarction. No foci of susceptibility
artifact to suggest acute or chronic intracranial hemorrhage.

No mass lesion, midline shift or mass effect. No hydrocephalus or
extra-axial fluid collection. Pituitary gland within normal limits.
Suprasellar region normal. Midline structures intact.

Vascular: Major intracranial vascular flow voids are maintained.

Skull and upper cervical spine: Craniocervical junction within
normal limits. Bone marrow signal intensity normal. No scalp soft
tissue abnormality.

Sinuses/Orbits: Patient status post bilateral ocular lens
replacement. Globes and orbital soft tissues demonstrate no acute
finding. Mild scattered mucosal thickening noted within the
ethmoidal air cells. Paranasal sinuses are otherwise clear. No
mastoid effusion. Inner ear structures grossly normal.

Other: None.
IMPRESSION: 1. 2.1 cm acute ischemic nonhemorrhagic right basal ganglia infarct.
2. Underlying moderate chronic microvascular ischemic disease.

## 2021-04-16 IMAGING — DX DG CHEST 1V PORT
1 series · 1 of 1 positions shown · non-contrast
Comparison: Chest x-ray dated [DATE].

CLINICAL DATA: Stroke.

EXAM:
PORTABLE CHEST 1 VIEW

[chest]
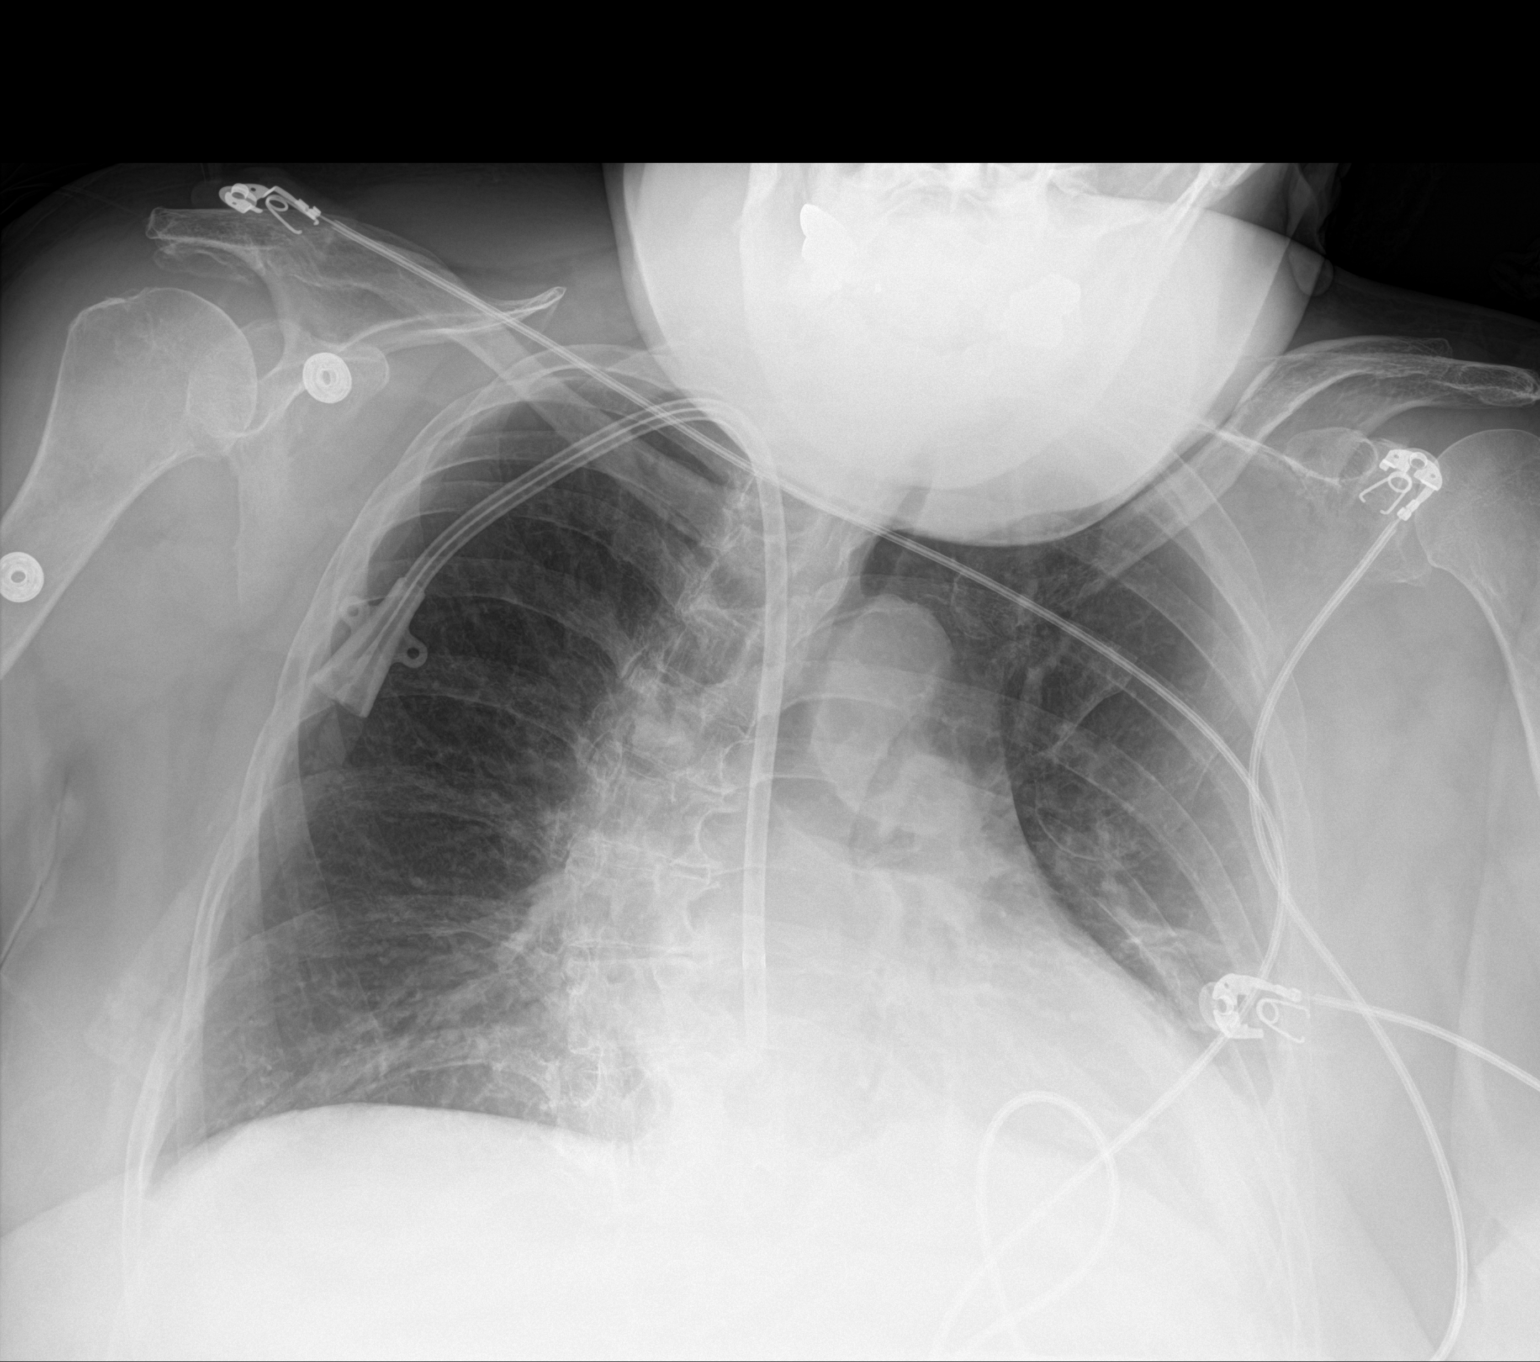

[1 of 1 positions shown; findings below may reference images not displayed]

FINDINGS: Unchanged tunneled right internal jugular dialysis catheter. Stable
cardiomegaly. Unchanged mild left basilar atelectasis. No focal
consolidation, pleural effusion, or pneumothorax. No acute osseous
abnormality.
IMPRESSION: No active disease.

## 2021-04-16 MED ORDER — ACETAMINOPHEN 160 MG/5ML PO SOLN: 650.0000 mg | ORAL | Status: DC | PRN

## 2021-04-16 MED ORDER — SENNOSIDES-DOCUSATE SODIUM 8.6-50 MG PO TABS: 1.0000 | ORAL_TABLET | Freq: Every evening | ORAL | Status: DC | PRN

## 2021-04-16 MED ORDER — HYDRALAZINE HCL 20 MG/ML IJ SOLN
10.0000 mg | INTRAMUSCULAR | Status: DC | PRN
  Administered 2021-04-16: 10 mg via INTRAVENOUS
  Filled 2021-04-16: qty 1

## 2021-04-16 MED ORDER — HYDROCORTISONE ACETATE 25 MG RE SUPP
25.0000 mg | Freq: Two times a day (BID) | RECTAL | Status: DC
  Administered 2021-04-16 – 2021-04-20 (×3): 25 mg via RECTAL
  Filled 2021-04-16 (×9): qty 1

## 2021-04-16 MED ORDER — ASPIRIN EC 81 MG PO TBEC
81.0000 mg | DELAYED_RELEASE_TABLET | Freq: Every day | ORAL | Status: DC
  Administered 2021-04-16 – 2021-04-17 (×2): 81 mg via ORAL
  Filled 2021-04-16 (×2): qty 1

## 2021-04-16 MED ORDER — INSULIN ASPART PROT & ASPART (70-30 MIX) 100 UNIT/ML ~~LOC~~ SUSP
10.0000 [IU] | Freq: Two times a day (BID) | SUBCUTANEOUS | Status: DC
  Administered 2021-04-16: 10 [IU] via SUBCUTANEOUS
  Filled 2021-04-16 (×2): qty 10

## 2021-04-16 MED ORDER — ONDANSETRON HCL 4 MG/2ML IJ SOLN
4.0000 mg | Freq: Four times a day (QID) | INTRAMUSCULAR | Status: DC | PRN
  Administered 2021-04-17: 4 mg via INTRAVENOUS
  Filled 2021-04-16: qty 2

## 2021-04-16 MED ORDER — ROSUVASTATIN CALCIUM 20 MG PO TABS
20.0000 mg | ORAL_TABLET | Freq: Every day | ORAL | Status: DC
  Administered 2021-04-16 – 2021-04-20 (×4): 20 mg via ORAL
  Filled 2021-04-16 (×4): qty 1

## 2021-04-16 MED ORDER — HEPARIN SODIUM (PORCINE) 5000 UNIT/ML IJ SOLN
5000.0000 [IU] | Freq: Three times a day (TID) | INTRAMUSCULAR | Status: DC
  Administered 2021-04-16 – 2021-04-17 (×3): 5000 [IU] via SUBCUTANEOUS
  Filled 2021-04-16 (×4): qty 1

## 2021-04-16 MED ORDER — SEVELAMER CARBONATE 800 MG PO TABS
800.0000 mg | ORAL_TABLET | Freq: Three times a day (TID) | ORAL | Status: DC
  Administered 2021-04-16 – 2021-04-20 (×7): 800 mg via ORAL
  Filled 2021-04-16 (×9): qty 1

## 2021-04-16 MED ORDER — FUROSEMIDE 20 MG PO TABS
40.0000 mg | ORAL_TABLET | ORAL | Status: DC
  Administered 2021-04-19: 40 mg via ORAL
  Filled 2021-04-16: qty 4
  Filled 2021-04-16: qty 2

## 2021-04-16 MED ORDER — FLUTICASONE PROPIONATE 50 MCG/ACT NA SUSP
2.0000 | Freq: Every day | NASAL | Status: DC | PRN
  Filled 2021-04-16: qty 16

## 2021-04-16 MED ORDER — STROKE: EARLY STAGES OF RECOVERY BOOK
Freq: Once | Status: AC
  Filled 2021-04-16: qty 1

## 2021-04-16 MED ORDER — ARFORMOTEROL TARTRATE 15 MCG/2ML IN NEBU
15.0000 ug | INHALATION_SOLUTION | Freq: Two times a day (BID) | RESPIRATORY_TRACT | Status: DC
  Administered 2021-04-17 – 2021-04-20 (×6): 15 ug via RESPIRATORY_TRACT
  Filled 2021-04-16 (×8): qty 2

## 2021-04-16 MED ORDER — ACETAMINOPHEN 650 MG RE SUPP: 650.0000 mg | RECTAL | Status: DC | PRN

## 2021-04-16 MED ORDER — BUDESONIDE 0.5 MG/2ML IN SUSP
0.5000 mg | Freq: Two times a day (BID) | RESPIRATORY_TRACT | Status: DC
  Administered 2021-04-17 – 2021-04-20 (×6): 0.5 mg via RESPIRATORY_TRACT
  Filled 2021-04-16 (×8): qty 2

## 2021-04-16 MED ORDER — TRAMADOL HCL 50 MG PO TABS
50.0000 mg | ORAL_TABLET | Freq: Two times a day (BID) | ORAL | Status: DC | PRN
  Administered 2021-04-16 – 2021-04-20 (×2): 50 mg via ORAL
  Filled 2021-04-16 (×4): qty 1

## 2021-04-16 MED ORDER — DOCUSATE SODIUM 100 MG PO CAPS: 100.0000 mg | ORAL_CAPSULE | Freq: Every day | ORAL | Status: DC | PRN

## 2021-04-16 MED ORDER — ALBUTEROL SULFATE (2.5 MG/3ML) 0.083% IN NEBU
2.5000 mg | INHALATION_SOLUTION | RESPIRATORY_TRACT | Status: DC | PRN
  Administered 2021-04-17 – 2021-04-18 (×2): 2.5 mg via RESPIRATORY_TRACT
  Filled 2021-04-16 (×2): qty 3

## 2021-04-16 MED ORDER — CARVEDILOL 3.125 MG PO TABS
3.1250 mg | ORAL_TABLET | Freq: Two times a day (BID) | ORAL | Status: DC
  Administered 2021-04-16 – 2021-04-20 (×5): 3.125 mg via ORAL
  Filled 2021-04-16 (×6): qty 1

## 2021-04-16 MED ORDER — ACETAMINOPHEN 325 MG PO TABS
650.0000 mg | ORAL_TABLET | ORAL | Status: DC | PRN
  Administered 2021-04-16: 650 mg via ORAL
  Filled 2021-04-16 (×2): qty 2

## 2021-04-16 MED ORDER — HYDROCORTISONE (PERIANAL) 2.5 % EX CREA
1.0000 "application " | TOPICAL_CREAM | Freq: Two times a day (BID) | CUTANEOUS | Status: DC | PRN
  Administered 2021-04-19: 1 via RECTAL
  Filled 2021-04-16: qty 28.35

## 2021-04-16 NOTE — Chronic Care Management (AMB) (Signed)
  Care Management   Inpatient Admit Review Note  04/16/2021 Name: Carolyn Russo MRN: [id number] DOB: 10-15-1951  Carolyn Russo is a 70 y.o. year old female who is a primary care patient of [name], MD. [name] is actively engaged with the embedded care management team in the primary care practice and is being followed by RN Case Manager and BSW  for assistance with disease management and care coordination needs related to  Acute Pulmonary Edema, Acute on Chronic Diastolic CHF, CKD Stage V, and DM II .   Carolyn Russo is currently admitted to the hospital for evaluation and treatment of  CVA . Current inpatient treatment plan is outline below:   Assessment/Plan CVA (cerebral vascular accident) : Subacute.  Patient reports -Admit to a medical telemetry bed -Neurochecks -Check MRI -PT/OT/speech to evaluate and treat -Transitions of care consulted   ESRD on HD: Patient normally dialyzes Monday, Wednesday, Friday.  She went to hemodialysis today and had a complete session prior to being transferred to the hospital.  Labs revealed potassium 4, BUN 12, creatinine 3.02. -Consider need to formally consult nephrology if patient suspected have a prolonged stay.   Diabetes mellitus type 2 controlled: On admission glucose 192.  Patient's last hemoglobin A1c was 6.2 on 4/27.  Patient's home insulin regimen includes NovoLog 70/30 insulin 15 to 20 units twice daily. -Hypoglycemic protocols -Reduced home 70/30 insulin to 10 units twice daily with meals -CBGs before every meal and at bedtime -Adjust regimen as needed:   COPD -Continue albuterol nebs as needed for shortness of breath/wheezing -Substitution of Brovana and budesonide for Symbicort   Essential hypertension: On admission blood pressures currently stable. Hyperlipidemia -Continue Crestor  Plan: CM team will collaborate with [facility] and will follow patient post discharge.    Daneen Schick, BSW, CDP Social  Worker, Certified Dementia Practitioner [place] [facility] [facility] [phone]

## 2021-04-16 NOTE — ED Provider Notes (Signed)
[facility] EMERGENCY DEPARTMENT Provider Note   CSN: [id number] Arrival date & time: 04/16/21  1146     History No chief complaint on file.   Rashan JNYAH BRAZEE is a 70 y.o. female.  Patient presents with persistent left-sided facial droop slurred speech.  Symptoms have been ongoing for the past 3 days.  She attempted to be seen by the ER a few days ago but left because the wait was too long.  She went to dialysis today and finished dialysis but continued to have facial droop and slurred speech and was sent to the ER for evaluation.  She otherwise denies any headache or fevers.  No cough no vomiting or diarrhea.      Past Medical History:  Diagnosis Date   Allergy    Anterior chest wall pain    Appendicitis 1965   Asthma    Body mass index 37.0-37.9, adult    Breast pain    Cataract    both eyes   Chronic renal disease, stage II    Dehydration 2014   Deviated septum 1971   Diabetes mellitus    Dyspnea 2014   Extrinsic asthma    WITH ASTHMA ATTACK   Fibroid 1980   GERD (gastroesophageal reflux disease)    Heart murmur    Hx gestational diabetes    Hyperlipidemia    Hypertension 2014   Inguinal hernia 1959   Malaise and fatigue 2014   Nephropathy    Non-IgE mediated allergic asthma 2014   Obesity    Pelvic pain    Pregnancy, high-risk [place]   Uterine fibroid 1980    Patient Active Problem List   Diagnosis Date Noted   Hypertensive urgency 03/10/2021   Hilar enlargement    Symptomatic anemia    Acute on chronic diastolic CHF (congestive heart failure) ([place]) 03/09/2021   CKD (chronic kidney disease), stage V ([place]) 03/08/2021   Diabetic retinopathy ([place]) 03/08/2021   Hyperglycemia due to type 2 diabetes mellitus ([place]) 03/08/2021   Pure hypercholesterolemia 03/08/2021   Anemia in chronic kidney disease 09/24/2020   Uncontrolled type 2 diabetes mellitus with hyperglycemia ([place]) 03/19/2019   Osteopenia 09/04/2018   Migraine  09/04/2018   Asthma 09/04/2018   Pseudophakia of both eyes 07/17/2018   Pseudophakia of left eye 07/03/2018   Open angle with borderline findings and high glaucoma risk in left eye 07/03/2018   Age-related nuclear cataract of right eye 07/03/2018   CAD (coronary artery disease), native coronary artery 04/27/2017   Abnormal nuclear stress test 04/04/2017   Dyspnea on exertion 03/09/2017   Appendicitis    Age-related hypermature cataract of both eyes 12/20/2016   Uterine leiomyoma 11/27/2012   Dyslipidemia (high LDL; low HDL) 11/25/2011   Obesity (BMI 30-39.9) 11/25/2011   Hypertensive disorder 11/21/2011   Type 2 diabetes mellitus ([place]) 11/21/2011   Abnormal EKG 11/21/2011    Past Surgical History:  Procedure Laterality Date   [place]     bilateral cataract    HERNIA REPAIR  1959   IR FLUORO GUIDE CV LINE RIGHT  03/18/2021   IR US GUIDE VASC ACCESS RIGHT  03/18/2021   LEFT HEART CATH AND CORONARY ANGIOGRAPHY N/A 04/04/2017   Procedure: Left Heart Cath and Coronary Angiography;  Surgeon: Belva Crome, MD;  Location: [facility];  Service: Cardiovascular;  Laterality: N/A;   MYOMECTOMY  1980, 2004, 2007   RHINOPLASTY  1971   ROTATOR CUFF REPAIR  2003   SURGICAL REPAIR OF HEMORRHAGE  2015   TONSILLECTOMY  1968     OB History     Gravida  2   Para  1   Term      Preterm      AB      Living  1      SAB      IAB      Ectopic      Multiple      Live Births              Family History  Problem Relation Age of Onset   Cancer Mother        abdominal melamona   Psoriasis Mother    Alzheimer's disease Father    Cancer Cousin        colon    Diabetes Cousin    Diabetes Maternal Aunt    Colon cancer Neg Hx    Colon polyps Neg Hx    Esophageal cancer Neg Hx    Rectal cancer Neg Hx    Stomach cancer Neg Hx    Breast cancer Neg Hx     Social History   Tobacco Use   Smoking  status: Never   Smokeless tobacco: Never  Vaping Use   Vaping Use: Never used  Substance Use Topics   Alcohol use: No   Drug use: No    Home Medications Prior to Admission medications   Medication Sig Start Date End Date Taking? Authorizing Provider  albuterol (PROVENTIL) (2.5 MG/3ML) 0.083% nebulizer solution Take 3 mLs (2.5 mg total) by nebulization every 4 (four) hours as needed for wheezing or shortness of breath. 03/04/21 03/04/22 Yes Ghumman, Ramandeep, NP  albuterol (VENTOLIN HFA) 108 (90 Base) MCG/ACT inhaler Inhale 2 puffs into the lungs every 4 (four) hours as needed for wheezing or shortness of breath. 03/01/21  Yes [name], MD  aspirin 81 MG tablet Take 81 mg by mouth daily.   Yes [provider]  b complex vitamins capsule Take 1 capsule by mouth daily.   Yes [provider]  BESIVANCE 0.6 % SUSP Place 1 drop into both eyes See admin instructions. After  eye injections 10/26/18  Yes [provider]  [place] Oil-Levomenthol (FDGARD PO) Take 1 tablet by mouth 2 (two) times daily as needed (nausea).   Yes [provider]  carvedilol (COREG) 3.125 MG tablet Take 1 tablet (3.125 mg total) by mouth 2 (two) times daily with a meal. 03/24/21  Yes Nita Sells, MD  Cholecalciferol (VITAMIN D3 PO) Take 2,000 Units by mouth daily.   Yes [provider]  Continuous Blood Gluc Receiver (DEXCOM G6 RECEIVER) DEVI Use as directed to check blood sugars 3 times per day dx: e11.65 Patient taking differently: 1 each by Other route See admin instructions. Use as directed to check blood sugars 3 times per day dx: e11.65 04/14/20  Yes [name], MD  Continuous Blood Gluc Sensor (DEXCOM G6 SENSOR) MISC Use as directed to check blood sugars 3 times per day dx: e11.65 Patient taking differently: 1 each by Other route See admin instructions. Use as directed to check blood sugars 3 times per day dx: e11.65 04/14/20  Yes [name], MD  Continuous  Blood Gluc Transmit (DEXCOM G6 TRANSMITTER) MISC Use as directed to check blood sugars 3 times per day dx: e11.65 Patient taking differently: 1 each by Other  route See admin instructions. Use as directed to check blood sugars 3 times per day dx: e11.65 04/14/20  Yes [name], MD  docusate sodium (COLACE) 100 MG capsule Take 100 mg by mouth daily as needed for mild constipation.   Yes [provider]  fluticasone (FLONASE) 50 MCG/ACT nasal spray Place 2 sprays into the nose daily as needed for allergies.    Yes [provider]  furosemide (LASIX) 40 MG tablet Take 1-2 tablets (40-80 mg total) by mouth every Monday, Wednesday, and Friday. 80 mg twice a week. 40 mg all other days 03/24/21  Yes Nita Sells, MD  hydrocortisone (ANUSOL-HC) 2.5 % rectal cream Place 1 application rectally 2 (two) times daily. 04/12/21  Yes [name], MD  insulin aspart protamine - aspart (NOVOLOG MIX 70/30 FLEXPEN) (70-30) 100 UNIT/ML FlexPen Inject 1 mL (100 Units total) into the skin 2 (two) times daily with a meal. 20 units in the morning and 15 units at night Patient taking differently: Inject 15-20 Units into the skin 2 (two) times daily with a meal. 20 units in the morning and 15 units at night 03/23/21  Yes Nita Sells, MD  magnesium oxide (MAG-OX) 400 (241.3 Mg) MG tablet Take 400 mg by mouth daily.   Yes [provider]  mometasone-formoterol (DULERA) 200-5 MCG/ACT AERO Inhale 2 puffs into the lungs 2 (two) times daily. 02/25/21  Yes [name], MD  ondansetron (ZOFRAN) 4 MG tablet Take 1 tablet (4 mg total) by mouth every 6 (six) hours as needed for nausea or vomiting. 03/29/21 03/29/22 Yes [name], MD  OneTouch Delica Lancets 50D MISC Use as directed to check blood sugars before breakfast and dinner dx: e11.65 Patient taking differently: 1 each by Other route See admin instructions. Use as directed to check blood sugars before breakfast and dinner dx: e11.65  04/22/20  Yes [name], MD  rosuvastatin (CRESTOR) 20 MG tablet Take 1 tablet (20 mg total) by mouth daily. 11/19/20  Yes Belva Crome, MD  sevelamer carbonate (RENVELA) 800 MG tablet Take 1 tablet (800 mg total) by mouth 3 (three) times daily with meals. 03/24/21  Yes Nita Sells, MD  SYMBICORT 160-4.5 MCG/ACT inhaler Inhale 2 puffs into the lungs 2 (two) times daily. 02/26/21  Yes [name], MD  traMADol (ULTRAM) 50 MG tablet Take 1 tablet (50 mg total) by mouth every 12 (twelve) hours as needed for severe pain. 03/24/21  Yes Charlynne Cousins, MD  vitamin C (ASCORBIC ACID) 500 MG tablet Take 500 mg by mouth daily.   Yes [provider]  furosemide (LASIX) 80 MG tablet Tuesday Thursday saturday Patient not taking: Reported on 04/16/2021 03/23/21   Nita Sells, MD  iron polysaccharides (NIFEREX) 150 MG capsule Take 1 capsule (150 mg total) by mouth daily. Patient not taking: No sig reported 03/24/21   Nita Sells, MD  polyethylene glycol (MIRALAX / GLYCOLAX) 17 g packet Take 17 g by mouth daily. Patient not taking: Reported on 04/16/2021 03/20/20   [name], MD    Allergies    Food, Other, Wheat bran, Statins, Iodine, Shellfish allergy, Sitagliptin, Tetracycline, Tetracyclines & related, and Contrast media [iodinated diagnostic agents]  Review of Systems   Review of Systems  Constitutional:  Negative for fever.  HENT:  Negative for ear pain.   Eyes:  Negative for pain.  Respiratory:  Negative for cough.   Cardiovascular:  Negative for chest pain.  Gastrointestinal:  Negative for abdominal pain.  Genitourinary:  Negative for  flank pain.  Musculoskeletal:  Negative for back pain.  Skin:  Negative for rash.  Neurological:  Negative for headaches.   Physical Exam Updated Vital Signs BP (!) 156/73   Pulse 91   Resp 20   Ht 5\' 2"  (1.575 m)   Wt 83.9 kg   SpO2 100%   BMI 33.84 kg/m   Physical Exam Constitutional:      General: She is  not in acute distress.    Appearance: Normal appearance.  HENT:     Head: Normocephalic.     Nose: Nose normal.  Eyes:     Extraocular Movements: Extraocular movements intact.  Cardiovascular:     Rate and Rhythm: Normal rate.  Pulmonary:     Effort: Pulmonary effort is normal.  Musculoskeletal:        General: Normal range of motion.     Cervical back: Normal range of motion.  Neurological:     Mental Status: She is alert.     Comments: Cranial nerves II through XII intact other than left-sided facial droop.  Speech is moderately slurred but comprehendible.  Weakness in the left upper extremity 4 out of 5.  Bilateral lower extremities appear normal strength.    ED Results / Procedures / Treatments   Labs (all labs ordered are listed, but only abnormal results are displayed) Labs Reviewed  CBC WITH DIFFERENTIAL/PLATELET - Abnormal; Notable for the following components:      Result Value   RBC 3.10 (*)    Hemoglobin 9.0 (*)    HCT 27.3 (*)    RDW 16.1 (*)    Monocytes Absolute 1.3 (*)    All other components within normal limits  BASIC METABOLIC PANEL - Abnormal; Notable for the following components:   Chloride 96 (*)    CO2 34 (*)    Glucose, Bld 192 (*)    Creatinine, Ser 3.02 (*)    Calcium 8.2 (*)    GFR, Estimated 16 (*)    All other components within normal limits  RESP PANEL BY RT-PCR (FLU A&B, COVID) [id number]    EKG EKG Interpretation  Date/Time:  Friday April 16 2021 11:56:52 EDT Ventricular Rate:  86 PR Interval:  155 QRS Duration: 111 QT Interval:  390 QTC Calculation: 467 R Axis:   -50 Text Interpretation: Sinus rhythm Atrial premature complexes Inferior infarct, old Consider anterior infarct Confirmed by Thamas Jaegers ([id number]) on 04/16/2021 12:34:24 PM  Radiology CT HEAD WO CONTRAST  Result Date: 04/14/2021 CLINICAL DATA:  Possible stroke.  LEFT-sided weakness EXAM: CT HEAD WITHOUT CONTRAST TECHNIQUE: Contiguous axial images were obtained from the base  of the skull through the vertex without intravenous contrast. COMPARISON:  None. FINDINGS: Brain: No acute intracranial hemorrhage. No focal mass lesion. No CT evidence of acute infarction. No midline shift or mass effect. No hydrocephalus. Basilar cisterns are patent. There are periventricular and subcortical white matter hypodensities. Generalized cortical atrophy. Vascular: No hyperdense vessel or unexpected calcification. Skull: Normal. Negative for fracture or focal lesion. Sinuses/Orbits: Paranasal sinuses and mastoid air cells are clear. Orbits are clear. Other: None. IMPRESSION: 1. No acute intracranial findings. 2. Atrophy and white matter microvascular disease. Electronically Signed   By: Suzy Bouchard M.D.   On: 04/14/2021 18:58    Procedures Procedures   Medications Ordered in ED Medications - No data to display  ED Course  I have reviewed the triage vital signs and the nursing notes.  Pertinent labs & imaging results that were available during my  care of the patient were reviewed by me and considered in my medical decision making (see chart for details).    MDM Rules/Calculators/A&P                          Labs are unremarkable white count normal chemistry normal consistent with end-stage renal disease.  Review of prior CT scan performed 2 days ago shows no acute findings.  Given clinical symptoms I suspect the stroke.  Patient remitted to the hospitalist team for stroke work-up.   Final Clinical Impression(s) / ED Diagnoses Final diagnoses:  Cerebrovascular accident (CVA), unspecified mechanism [facility])    Rx / [place] Orders ED Discharge Orders     None        Luna Fuse, MD 04/16/21 1434

## 2021-04-16 NOTE — Progress Notes (Signed)
Pt down to MRI at this time

## 2021-04-16 NOTE — ED Notes (Signed)
Floor nurse hung up on me when I tried to give report.

## 2021-04-16 NOTE — Progress Notes (Signed)
New admission to room 2W31. Patient is A&O*4. Able to verbalize her needs to staff. VS checked and monitor applied. CCMD called. Permcath to the right chest with dressing dry and intact. No CO pain or discomfort at this time. Bed kept in low position and locked. Call bell in reach.

## 2021-04-16 NOTE — ED Notes (Signed)
Nurse report given to [place], RN

## 2021-04-16 NOTE — H&P (Signed)
History and Physical    Carolyn Russo GYJ:[id number] DOB: January 14, 1951 DOA: 04/16/2021  Referring MD/NP/PA: Quintella Reichert, MD PCP: [name], MD  Patient coming from: Hemodialysis via EMS  Chief Complaint: Left-sided weakness   I have personally briefly reviewed patient's old medical records in [place]   HPI: Carolyn Russo is a 70 y.o. female with medical history significant of hypertension, hyperlipidemia, ESRD on HD(MWF), insulin-dependent diabetes mellitus type 2, CAD, iron deficiency anemia, and asthma who presented with complaints of left-sided weakness.  Patient had just recently been hospitalized 5/17-6/1 with acute on chronic diastolic failure and newly end-stage renal disease requiring starting hemodialysis.  She reports symptoms started 3 days ago after she had hemodialysis the day prior.  Complained of having left face, shoulder, and leg weakness.  Noted associated symptoms of some slurred speech, nausea, and vomiting.  Emesis was not relieved with antiemetics.  She came to [facility] to be evaluated and had a negative CT scan of the brain at that time.  However, after waiting 8 hours prior to husband decided to leave before being formally seen. At baseline patient was getting around with the use of a cane before onset of symptoms, but was having to use a Rollator to get around.  She was seen by physical therapy yesterday and seemed to be getting better.  However, this morning patient was noted to have worsening weakness in her left leg for which she was almost unable to stand.  Husband reported that her speech was more slurred, and she seemed to be more lethargic.  She was able to complete her full hemodialysis session today, but was sent to the hospital thereafter.  Patient is not on anticoagulation but does take an aspirin daily.  ED Course: Upon admission into the emergency department patient was seen to have blood pressure 136/75-170 2/91, and all other vital signs  maintained.  Labs significant for hemoglobin 9, potassium 4, BUN 12, creatinine 3.02, and glucose 192.  COVID-19 and influenza screening were negative.  TRH was called to complete stroke work-up.  Review of Systems  Constitutional:  Positive for malaise/fatigue. Negative for fever.  HENT:  Negative for congestion and nosebleeds.   Eyes:  Negative for photophobia and pain.  Respiratory:  Negative for cough and shortness of breath.   Cardiovascular:  Positive for leg swelling. Negative for chest pain.  Gastrointestinal:  Positive for blood in stool (Hemorrhoids), nausea and vomiting. Negative for abdominal pain.  Genitourinary:  Negative for dysuria and hematuria.  Musculoskeletal:  Negative for falls.  Skin:  Negative for rash.  Neurological:  Positive for speech change and focal weakness. Negative for loss of consciousness.  Psychiatric/Behavioral:  Negative for substance abuse.    Past Medical History:  Diagnosis Date   Allergy    Anterior chest wall pain    Appendicitis 1965   Asthma    Body mass index 37.0-37.9, adult    Breast pain    Cataract    both eyes   Chronic renal disease, stage II    Dehydration 2014   Deviated septum 1971   Diabetes mellitus    Dyspnea 2014   Extrinsic asthma    WITH ASTHMA ATTACK   Fibroid 1980   GERD (gastroesophageal reflux disease)    Heart murmur    Hx gestational diabetes    Hyperlipidemia    Hypertension 2014   Inguinal hernia 1959   Malaise and fatigue 2014   Nephropathy    Non-IgE mediated  allergic asthma 2014   Obesity    Pelvic pain    Pregnancy, high-risk 1985   Tonsillitis 1968   Uterine fibroid 1980    Past Surgical History:  Procedure Laterality Date   [place]   COLONOSCOPY     EYE SURGERY     bilateral cataract    HERNIA REPAIR  1959   IR FLUORO GUIDE CV LINE RIGHT  03/18/2021   IR US GUIDE VASC ACCESS RIGHT  03/18/2021   LEFT HEART CATH AND CORONARY ANGIOGRAPHY N/A 04/04/2017    Procedure: Left Heart Cath and Coronary Angiography;  Surgeon: Belva Crome, MD;  Location: [place] [facility];  Service: Cardiovascular;  Laterality: N/A;   MYOMECTOMY  1980, 2004, 2007   RHINOPLASTY  1971   ROTATOR CUFF REPAIR  2003   SURGICAL REPAIR OF HEMORRHAGE  2015   TONSILLECTOMY  1968     reports that she has never smoked. She has never used smokeless tobacco. She reports that she does not drink alcohol and does not use drugs.  Allergies  Allergen Reactions   Food Anaphylaxis    Peanuts; Almonds   Other Shortness Of Breath    Peanuts; Almonds   Wheat Bran Shortness Of Breath   Statins Itching and Other (See Comments)    Generalized aches   Iodine Other (See Comments)    The patient denied this allergy to me on 03/16/2021   Shellfish Allergy Other (See Comments)    Mouth gets raw   Sitagliptin     Other reaction(s): Unknown   Tetracycline Other (See Comments)    Raw mouth   Tetracyclines & Related Itching   Contrast Media [Iodinated Diagnostic Agents] Rash    Happened during CT scan over 30 years ago    Family History  Problem Relation Age of Onset   Cancer Mother        abdominal melamona   Psoriasis Mother    Alzheimer's disease Father    Cancer Cousin        colon    Diabetes Cousin    Diabetes Maternal Aunt    Colon cancer Neg Hx    Colon polyps Neg Hx    Esophageal cancer Neg Hx    Rectal cancer Neg Hx    Stomach cancer Neg Hx    Breast cancer Neg Hx     Prior to Admission medications   Medication Sig Start Date End Date Taking? Authorizing Provider  albuterol (PROVENTIL) (2.5 MG/3ML) 0.083% nebulizer solution Take 3 mLs (2.5 mg total) by nebulization every 4 (four) hours as needed for wheezing or shortness of breath. 03/04/21 03/04/22 Yes Ghumman, Ramandeep, NP  albuterol (VENTOLIN HFA) 108 (90 Base) MCG/ACT inhaler Inhale 2 puffs into the lungs every 4 (four) hours as needed for wheezing or shortness of breath. 03/01/21  Yes [name], MD   aspirin 81 MG tablet Take 81 mg by mouth daily.   Yes [provider]  b complex vitamins capsule Take 1 capsule by mouth daily.   Yes [provider]  BESIVANCE 0.6 % SUSP Place 1 drop into both eyes See admin instructions. After  eye injections 10/26/18  Yes [provider]  Caraway Oil-Levomenthol (FDGARD PO) Take 1 tablet by mouth 2 (two) times daily as needed (nausea).   Yes [provider]  carvedilol (COREG) 3.125 MG tablet Take 1 tablet (3.125 mg total) by mouth 2 (two) times daily with a meal. 03/24/21  Yes Nita Sells, MD  Cholecalciferol (VITAMIN D3 PO) Take 2,000 Units by mouth daily.   Yes [provider]  Continuous Blood Gluc Receiver (DEXCOM G6 RECEIVER) DEVI Use as directed to check blood sugars 3 times per day dx: e11.65 Patient taking differently: 1 each by Other route See admin instructions. Use as directed to check blood sugars 3 times per day dx: e11.65 04/14/20  Yes [name], MD  Continuous Blood Gluc Sensor (DEXCOM G6 SENSOR) MISC Use as directed to check blood sugars 3 times per day dx: e11.65 Patient taking differently: 1 each by Other route See admin instructions. Use as directed to check blood sugars 3 times per day dx: e11.65 04/14/20  Yes [name], MD  Continuous Blood Gluc Transmit (DEXCOM G6 TRANSMITTER) MISC Use as directed to check blood sugars 3 times per day dx: e11.65 Patient taking differently: 1 each by Other route See admin instructions. Use as directed to check blood sugars 3 times per day dx: e11.65 04/14/20  Yes [name], MD  docusate sodium (COLACE) 100 MG capsule Take 100 mg by mouth daily as needed for mild constipation.   Yes [provider]  fluticasone (FLONASE) 50 MCG/ACT nasal spray Place 2 sprays into the nose daily as needed for allergies.    Yes [provider]  furosemide (LASIX) 40 MG tablet Take 1-2 tablets (40-80 mg total) by mouth every Monday, Wednesday, and  Friday. 80 mg twice a week. 40 mg all other days 03/24/21  Yes Nita Sells, MD  hydrocortisone (ANUSOL-HC) 2.5 % rectal cream Place 1 application rectally 2 (two) times daily. 04/12/21  Yes [name], MD  insulin aspart protamine - aspart (NOVOLOG MIX 70/30 FLEXPEN) (70-30) 100 UNIT/ML FlexPen Inject 1 mL (100 Units total) into the skin 2 (two) times daily with a meal. 20 units in the morning and 15 units at night Patient taking differently: Inject 15-20 Units into the skin 2 (two) times daily with a meal. 20 units in the morning and 15 units at night 03/23/21  Yes Nita Sells, MD  magnesium oxide (MAG-OX) 400 (241.3 Mg) MG tablet Take 400 mg by mouth daily.   Yes [provider]  mometasone-formoterol (DULERA) 200-5 MCG/ACT AERO Inhale 2 puffs into the lungs 2 (two) times daily. 02/25/21  Yes [name], MD  ondansetron (ZOFRAN) 4 MG tablet Take 1 tablet (4 mg total) by mouth every 6 (six) hours as needed for nausea or vomiting. 03/29/21 03/29/22 Yes [name], MD  OneTouch Delica Lancets 16R MISC Use as directed to check blood sugars before breakfast and dinner dx: e11.65 Patient taking differently: 1 each by Other route See admin instructions. Use as directed to check blood sugars before breakfast and dinner dx: e11.65 04/22/20  Yes [name], MD  rosuvastatin (CRESTOR) 20 MG tablet Take 1 tablet (20 mg total) by mouth daily. 11/19/20  Yes Belva Crome, MD  sevelamer carbonate (RENVELA) 800 MG tablet Take 1 tablet (800 mg total) by mouth 3 (three) times daily with meals. 03/24/21  Yes Nita Sells, MD  SYMBICORT 160-4.5 MCG/ACT inhaler Inhale 2 puffs into the lungs 2 (two) times daily. 02/26/21  Yes [name], MD  traMADol (ULTRAM) 50 MG tablet Take 1 tablet (50 mg total) by mouth every 12 (twelve) hours as needed for severe pain. 03/24/21  Yes Charlynne Cousins, MD  vitamin C (ASCORBIC ACID) 500 MG tablet Take 500 mg by mouth daily.   Yes [provider]  furosemide (  LASIX) 80 MG tablet Tuesday Thursday saturday Patient not taking: Reported on 04/16/2021 03/23/21   Nita Sells, MD  iron polysaccharides (NIFEREX) 150 MG capsule Take 1 capsule (150 mg total) by mouth daily. Patient not taking: No sig reported 03/24/21   Nita Sells, MD  polyethylene glycol (MIRALAX / GLYCOLAX) 17 g packet Take 17 g by mouth daily. Patient not taking: Reported on 04/16/2021 03/20/20   [name], MD    Physical Exam:  Constitutional: Elderly female currently in no acute Vitals:   04/16/21 1245 04/16/21 1300 04/16/21 1345 04/16/21 1415  BP: (!) 148/83 138/60 136/75 (!) 156/73  Pulse: 89 87 89 91  Resp: 17 15 18 20   SpO2: 100% 98% 100% 100%  Weight:      Height:       Eyes: PERRL, lids and conjunctivae normal ENMT: Mucous membranes are moist. Posterior pharynx clear of any exudate or lesions. Left-sided facial droop with slurred speech. Neck: normal, supple, no masses, no thyromegaly.  No JVD appreciated. Respiratory: clear to auscultation bilaterally, no wheezing, no crackles. Normal respiratory effort. No accessory muscle use.  Cardiovascular: Regular rate and rhythm, no murmurs / rubs / gallops.  Right upper chest wall hemodialysis catheter in place.  2+ pitting edema in the lower extremity extremity edema. 2+ pedal pulses. No carotid bruits.  Abdomen: no tenderness, no masses palpated. No hepatosplenomegaly. Bowel sounds positive.  Musculoskeletal: no clubbing / cyanosis. No joint deformity upper and lower extremities. Good ROM, no contractures. Normal muscle tone.  Skin: no rashes, lesions, ulcers. No induration Neurologic: CN 2-12 grossly intact. Sensation intact, DTR normal.  Strength 4/5 in the left upper and lower extremity. Psychiatric: Normal judgment and insight. Alert and oriented x 3. Normal mood.     Labs on Admission: I have personally reviewed following labs and imaging studies  CBC: Recent Labs  Lab  04/14/21 1824 04/14/21 1833 04/16/21 1215  WBC 9.9  --  9.0  NEUTROABS 5.4  --  4.2  HGB 8.8* 10.5* 9.0*  HCT 26.7* 31.0* 27.3*  MCV 87.5  --  88.1  PLT 265  --  606   Basic Metabolic Panel: Recent Labs  Lab 04/14/21 1824 04/14/21 1833 04/16/21 1215  NA 135 135 136  K 4.0 4.0 4.0  CL 94* 93* 96*  CO2 32  --  34*  GLUCOSE 143* 139* 192*  BUN 14 16 12   CREATININE 3.58* 3.60* 3.02*  CALCIUM 8.5*  --  8.2*   GFR: Estimated Creatinine Clearance: 16.9 mL/min (A) (by C-G formula based on SCr of 3.02 mg/dL (H)). Liver Function Tests: Recent Labs  Lab 04/14/21 1824  AST 27  ALT 21  ALKPHOS 59  BILITOT 0.7  PROT 6.9  ALBUMIN 3.4*   No results for input(s): LIPASE, AMYLASE in the last 168 hours. No results for input(s): AMMONIA in the last 168 hours. Coagulation Profile: Recent Labs  Lab 04/14/21 1824  INR 1.0   Cardiac Enzymes: No results for input(s): CKTOTAL, CKMB, CKMBINDEX, TROPONINI in the last 168 hours. BNP (last 3 results) No results for input(s): PROBNP in the last 8760 hours. HbA1C: No results for input(s): HGBA1C in the last 72 hours. CBG: Recent Labs  Lab 04/14/21 1814  GLUCAP 146*   Lipid Profile: No results for input(s): CHOL, HDL, LDLCALC, TRIG, CHOLHDL, LDLDIRECT in the last 72 hours. Thyroid Function Tests: No results for input(s): TSH, T4TOTAL, FREET4, T3FREE, THYROIDAB in the last 72 hours. Anemia Panel: No results for input(s): VITAMINB12, FOLATE,  FERRITIN, TIBC, IRON, RETICCTPCT in the last 72 hours. Urine analysis:    Component Value Date/Time   COLORURINE YELLOW 03/09/2021 2156   APPEARANCEUR HAZY (A) 03/09/2021 2156   LABSPEC 1.015 03/09/2021 2156   PHURINE 6.0 03/09/2021 2156   GLUCOSEU >=500 (A) 03/09/2021 2156   HGBUR SMALL (A) 03/09/2021 2156   BILIRUBINUR NEGATIVE 03/09/2021 2156   BILIRUBINUR negative 06/18/2019 1409   KETONESUR NEGATIVE 03/09/2021 2156   PROTEINUR >=300 (A) 03/09/2021 2156   UROBILINOGEN 0.2 06/18/2019  1409   NITRITE NEGATIVE 03/09/2021 2156   LEUKOCYTESUR NEGATIVE 03/09/2021 2156   Sepsis Labs: No results found for this or any previous visit (from the past 240 hour(s)).   Radiological Exams on Admission: CT HEAD WO CONTRAST  Result Date: 04/14/2021 CLINICAL DATA:  Possible stroke.  LEFT-sided weakness EXAM: CT HEAD WITHOUT CONTRAST TECHNIQUE: Contiguous axial images were obtained from the base of the skull through the vertex without intravenous contrast. COMPARISON:  None. FINDINGS: Brain: No acute intracranial hemorrhage. No focal mass lesion. No CT evidence of acute infarction. No midline shift or mass effect. No hydrocephalus. Basilar cisterns are patent. There are periventricular and subcortical white matter hypodensities. Generalized cortical atrophy. Vascular: No hyperdense vessel or unexpected calcification. Skull: Normal. Negative for fracture or focal lesion. Sinuses/Orbits: Paranasal sinuses and mastoid air cells are clear. Orbits are clear. Other: None. IMPRESSION: 1. No acute intracranial findings. 2. Atrophy and white matter microvascular disease. Electronically Signed   By: Suzy Bouchard M.D.   On: 04/14/2021 18:58    EKG: Independently reviewed.  Sinus rhythm 86 bpm with premature atrial complexes.  Assessment/Plan Left-sided weakness secondary to suspected CVA (cerebral vascular accident): Subacute.  Patient presents with complaints of left-sided weakness which initially started on Tuesday.  CT scan of the head at that time did not show any acute abnormality.  Patient is out of the window for tPA.  She had just recently had an echocardiogram which showed EF of 50% with grade 2 diastolic dysfunction on 1/61/0960. -Admit to a medical telemetry bed -Neurochecks -Check MRI  -Check chest x-ray -PT/OT/speech to evaluate and treat -Transitions of care consulted -Neurology formally consulted, will follow-up for further recommendation  ESRD on HD: Patient normally dialyzes  Monday, Wednesday, Friday.  She went to hemodialysis today and had a complete session prior to being transferred to the hospital.  Labs revealed potassium 4, BUN 12, creatinine 3.02.   -Renal and carb modified diet with fluid restriction -Consider need to formally consult nephrology if patient suspected have a prolonged stay.  Diabetes mellitus type 2, controlled: On admission glucose 192.  Patient's last hemoglobin A1c was 6.2 on 4/27.  Patient's home insulin regimen includes NovoLog 70/30 insulin 15 to 20 units twice daily. -Hypoglycemic protocols -Reduced home 70/30 insulin to 10 units twice daily with meals -CBGs before every meal and at bedtime -Adjust regimen as needed  Diastolic congestive heart failure: Patient with at least 2+ pitting edema bilateral lower extremities, but lung sounds clear. Last echocardiogram revealed EF of 50% with grade 2 diastolic dysfunction on 4/54/0981. -Follow-up chest x-ray  Anemia of chronic kidney disease: On admission hemoglobin 9 g/dL which appears around patient's baseline.  Baseline hemoglobin appears to be be around 8.  Patient reports that she has been having bleeding from her hemorrhoids, but she has been using hydrocortisone cream which has helped.  -Continue to monitor  Hemorrhoids: Patient reports that she has had recent bleeding from hemorrhoids.  Has been taking hydrocortisone cream that has  helped. -Hydrocortisone suppositories scheduled and cream as needed  Hypertensive urgency: On admission blood pressures elevated up to 186/84.  Given duration of symptoms would suspect patient would be out of the window for permissive hypertension for -Continue Coreg -Hydralazine IV as needed for elevated blood pressures greater than 180  History of asthma: Patient without acute exacerbation. -Continue albuterol nebs as needed for shortness of breath/wheezing -Substitution of Brovana and budesonide nebs for Symbicort  Hyperlipidemia -Continue  Crestor  Obesity: BMI 33.84 kg/m  DVT prophylaxis: Heparin  Code Status: full Family Communication: Husband updated over the phone Disposition Plan: To be determined Consults called: Neurology Admission status: Inpatient, require more than 2 midnight stay due to acute stroke.  Norval Morton MD [facility]   If 7PM-7AM, please contact night-coverage   04/16/2021, 2:47 PM

## 2021-04-16 NOTE — ED Triage Notes (Signed)
Pt BIB [facility] from dialysis center c/o of generalized weakness. Pt was seen 2 days ago for the same. Pt has been weaker since 3am per husband and then lethargic after dialysis. Pt last seen normal on 6/20.  Pt left AMA on the 22nd.

## 2021-04-16 NOTE — Plan of Care (Signed)

## 2021-04-17 ENCOUNTER — Inpatient Hospital Stay (HOSPITAL_COMMUNITY): Payer: Medicare Other

## 2021-04-17 DIAGNOSIS — I63311 Cerebral infarction due to thrombosis of right middle cerebral artery: Secondary | ICD-10-CM

## 2021-04-17 LAB — CBC WITH DIFFERENTIAL/PLATELET
Abs Immature Granulocytes: 0.01 10*3/uL (ref 0.00–0.07)
Basophils Absolute: 0.1 10*3/uL (ref 0.0–0.1)
Basophils Relative: 1 %
Eosinophils Absolute: 0.2 10*3/uL (ref 0.0–0.5)
Eosinophils Relative: 2 %
HCT: 26.1 % — ABNORMAL LOW (ref 36.0–46.0)
Hemoglobin: 8.9 g/dL — ABNORMAL LOW (ref 12.0–15.0)
Immature Granulocytes: 0 %
Lymphocytes Relative: 49 %
Lymphs Abs: 4.8 10*3/uL — ABNORMAL HIGH (ref 0.7–4.0)
MCH: 29.3 pg (ref 26.0–34.0)
MCHC: 34.1 g/dL (ref 30.0–36.0)
MCV: 85.9 fL (ref 80.0–100.0)
Monocytes Absolute: 1.2 10*3/uL — ABNORMAL HIGH (ref 0.1–1.0)
Monocytes Relative: 12 %
Neutro Abs: 3.6 10*3/uL (ref 1.7–7.7)
Neutrophils Relative %: 36 %
Platelets: 267 10*3/uL (ref 150–400)
RBC: 3.04 MIL/uL — ABNORMAL LOW (ref 3.87–5.11)
RDW: 15.9 % — ABNORMAL HIGH (ref 11.5–15.5)
WBC: 9.9 10*3/uL (ref 4.0–10.5)
nRBC: 0 % (ref 0.0–0.2)

## 2021-04-17 LAB — HEMOGLOBIN AND HEMATOCRIT, BLOOD
HCT: 24.8 % — ABNORMAL LOW (ref 36.0–46.0)
HCT: 21.5 % — ABNORMAL LOW (ref 36.0–46.0)
Hemoglobin: 8.3 g/dL — ABNORMAL LOW (ref 12.0–15.0)
Hemoglobin: 7.2 g/dL — ABNORMAL LOW (ref 12.0–15.0)

## 2021-04-17 LAB — GLUCOSE, CAPILLARY
Glucose-Capillary: 105 mg/dL — ABNORMAL HIGH (ref 70–99)
Glucose-Capillary: 207 mg/dL — ABNORMAL HIGH (ref 70–99)

## 2021-04-17 LAB — RENAL FUNCTION PANEL
Albumin: 3.1 g/dL — ABNORMAL LOW (ref 3.5–5.0)
Anion gap: 10 (ref 5–15)
BUN: 16 mg/dL (ref 8–23)
CO2: 29 mmol/L (ref 22–32)
Calcium: 8.8 mg/dL — ABNORMAL LOW (ref 8.9–10.3)
Chloride: 97 mmol/L — ABNORMAL LOW (ref 98–111)
Creatinine, Ser: 4.08 mg/dL — ABNORMAL HIGH (ref 0.44–1.00)
GFR, Estimated: 11 mL/min — ABNORMAL LOW (ref >60)
Glucose, Bld: 90 mg/dL (ref 70–99)
Phosphorus: 3.7 mg/dL (ref 2.5–4.6)
Potassium: 3.6 mmol/L (ref 3.5–5.1)
Sodium: 136 mmol/L (ref 135–145)

## 2021-04-17 IMAGING — MR MR MRA NECK W/O CM
3 series · 38 of 48 positions shown · non-contrast
Comparison: Prior MRI from [DATE].

CLINICAL DATA: Initial evaluation for acute stroke.

EXAM:
MRA NECK WITHOUT CONTRAST
MRA HEAD WITHOUT CONTRAST
TECHNIQUE: Multiplanar and multiecho pulse sequences of the neck were obtained
without intravenous contrast. Angiographic images of the neck were
obtained using MRA technique without and with intravenous contrast;
Angiographic images of the Circle of Willis were obtained using MRA
technique without intravenous contrast.

[Series 14: tof_fl3d_tra_iso · axial · 0.6mm · 0.52mm/px · z∈[-203,-127]mm · 11 of 133 slices shown (1 of 2)]
[im 1/133]
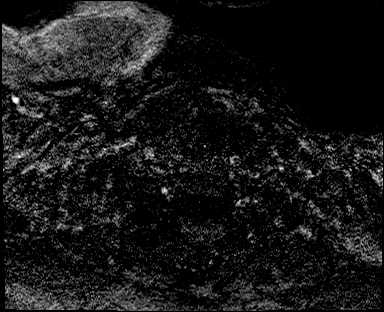
[im 14/133]
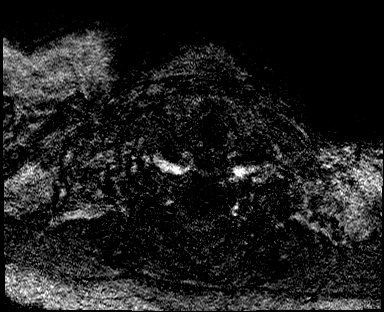
[im 27/133]
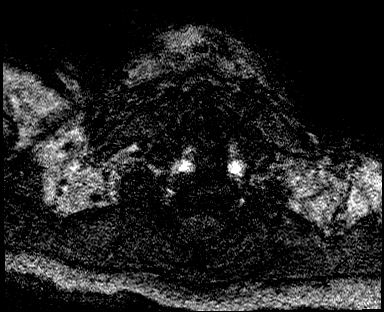
[im 40/133]
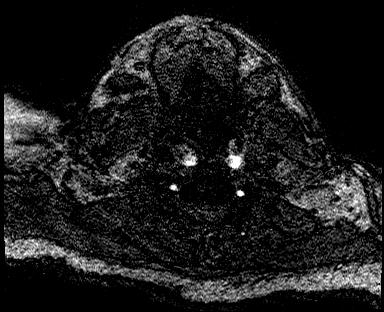
[im 53/133]
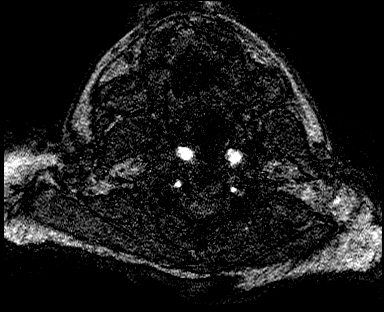
[im 67/133]
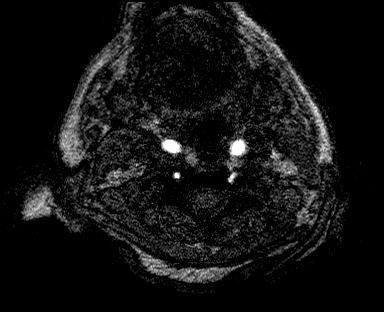
[im 80/133]
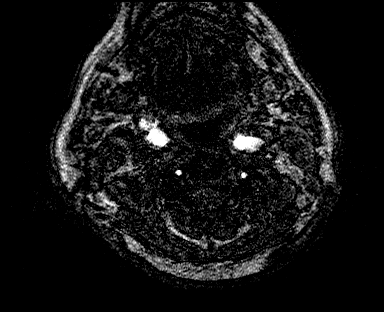
[im 93/133]
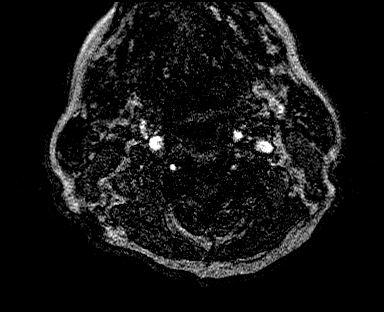
[im 106/133]
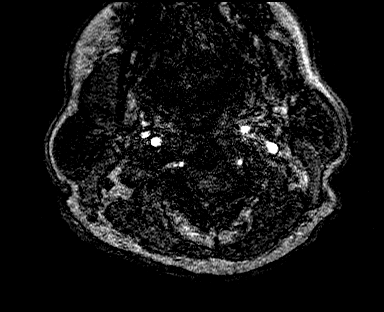
[im 119/133]
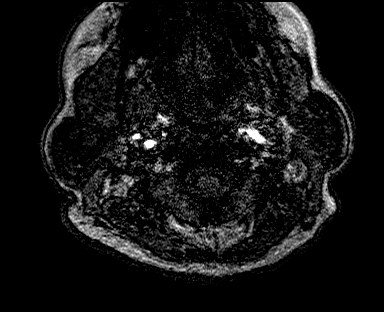
[im 133/133]
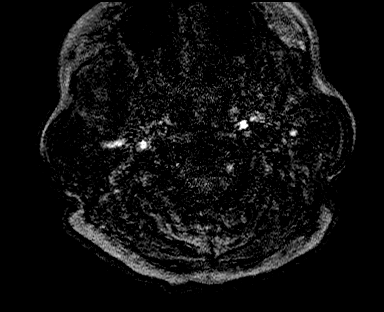

[Series 17: tof_fl3d_tra_iso · axial · 0.6mm · 0.52mm/px · z∈[-258,-66]mm · 20 of 347 slices shown (2 of 2)]
[im 1/347]
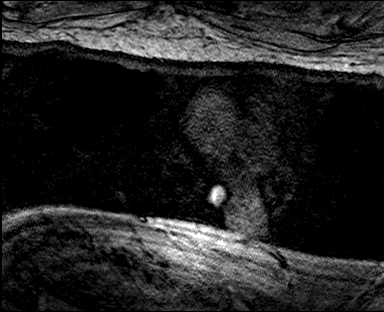
[im 12/347]
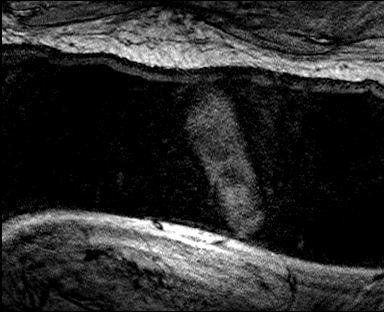
[im 24/347]
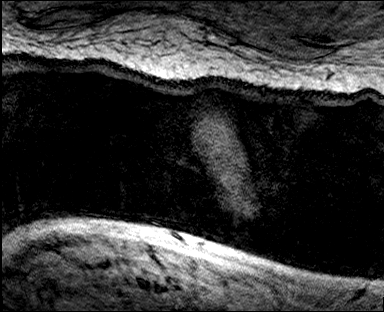
[im 36/347]
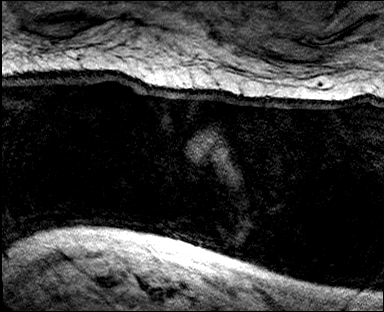
[im 48/347]
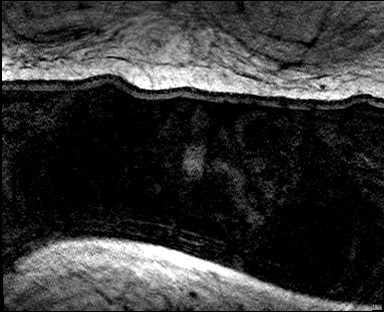
[im 60/347]
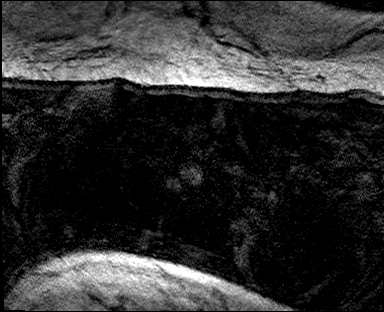
[im 72/347]
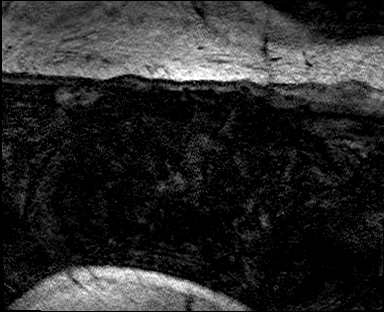
[im 84/347]
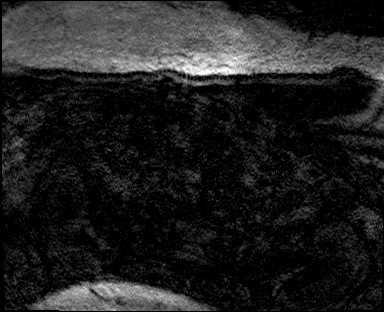
[im 96/347]
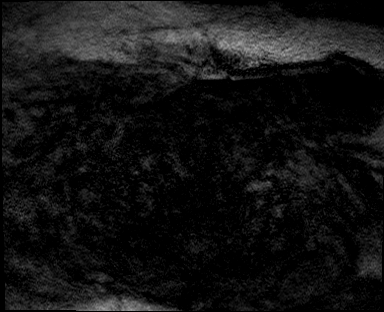
[im 108/347]
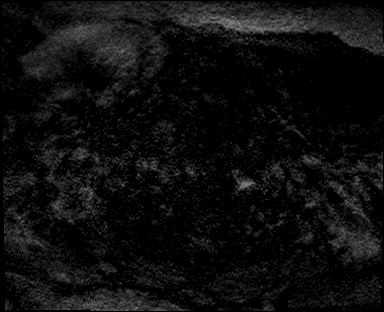
[im 120/347]
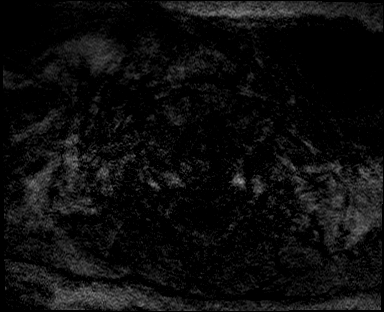
[im 132/347]
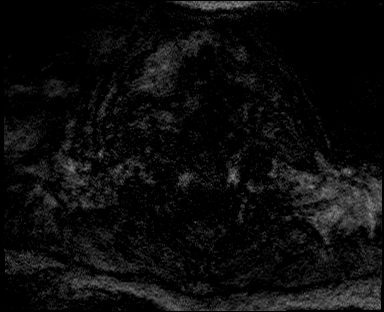
[im 144/347]
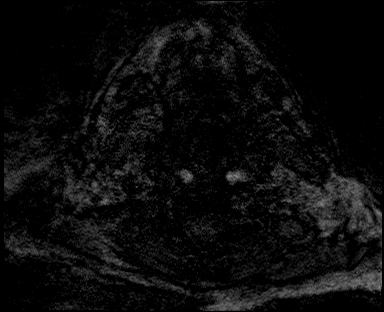
[im 156/347]
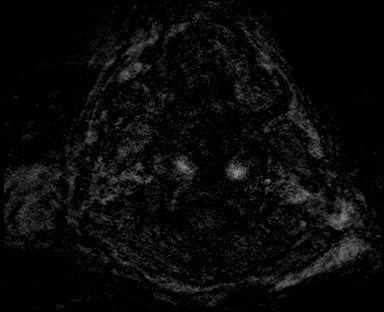
[im 179/347]
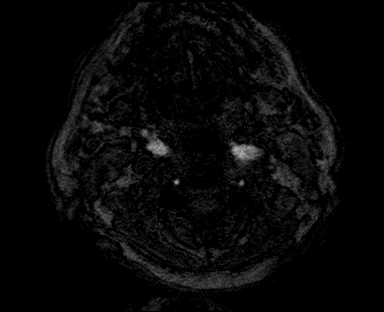
[im 191/347]
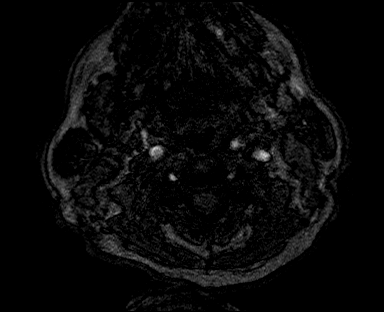
[im 239/347]
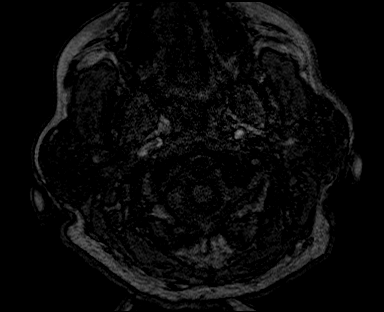
[im 287/347]
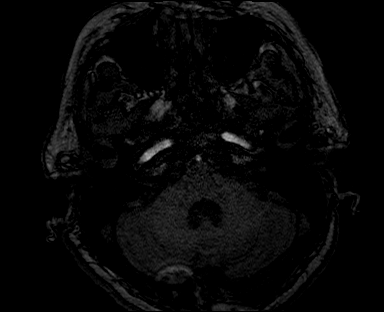
[im 299/347]
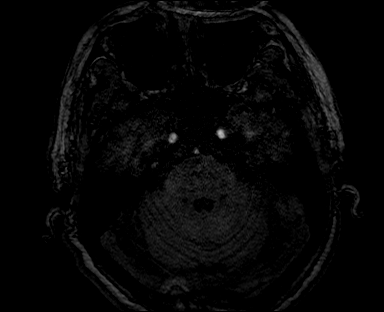
[im 335/347]
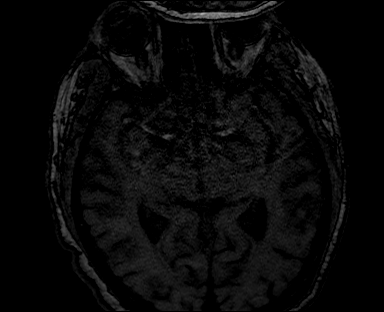

[Series 20: angio_fl3d_cor_pre_ttc=3.0s · coronal · 0.9mm · 0.85mm/px · 7 of 80 slices shown]
[im 1/80]
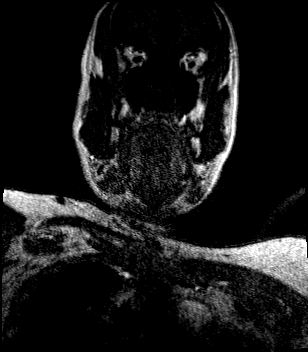
[im 14/80]
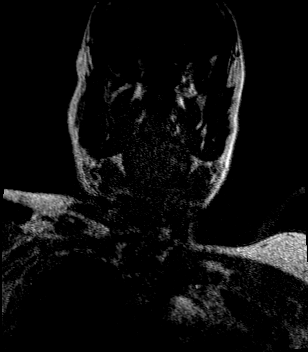
[im 27/80]
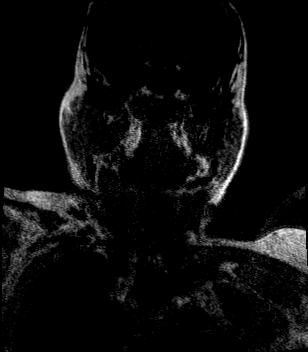
[im 40/80]
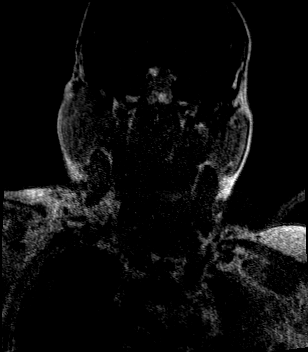
[im 53/80]
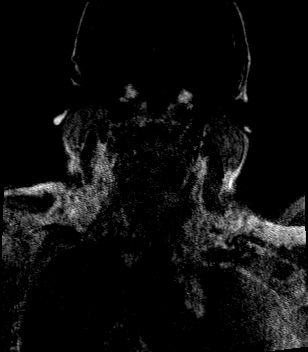
[im 66/80]
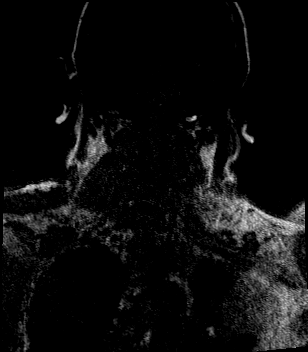
[im 80/80]
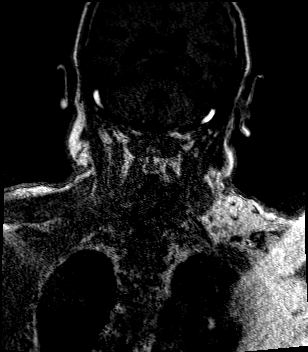

[38 of 48 positions shown; findings below may reference images not displayed]

FINDINGS: MRA NECK FINDINGS

AORTIC ARCH: Examination severely degraded by motion artifact.

Partially visualized aortic arch grossly normal in caliber with
normal branch pattern. No appreciable stenosis about the origin of
the great vessels.

RIGHT CAROTID SYSTEM: Visualized right CCA patent without stenosis.
No appreciable stenosis or significant irregularity about the right
carotid bifurcation. Visualized right ICA patent without stenosis or
occlusion.

LEFT CAROTID SYSTEM: Visualized left CCA patent without stenosis. No
significant atheromatous narrowing or irregularity about the left
bifurcation. Left ICA patent without stenosis or occlusion.

VERTEBRAL ARTERIES: Origins of the vertebral arteries not well
assessed on this exam. Visualized portions of the vertebral arteries
patent with antegrade flow, with no appreciable stenosis or
occlusion.

MRA HEAD FINDINGS

ANTERIOR CIRCULATION:

Examination moderately degraded by motion artifact.

Visualized distal cervical segments of the internal carotid arteries
are patent with antegrade flow. Petrous, cavernous, and supraclinoid
segments patent without stenosis or other abnormality. A1 segments
patent. Normal anterior communicating artery complex. Anterior
cerebral arteries grossly patent to their distal aspects without
appreciable stenosis. M1 segments patent bilaterally. Ill-defined
attenuation involving the distal left M1 segment and proximal M2
branches favored to be artifactual on this exam. No definite
proximal stenosis. MCA branches well perfused and symmetric.

POSTERIOR CIRCULATION:

Vertebral arteries codominant and patent to the vertebrobasilar
junction without stenosis. Both PICA origins patent and normal.
Basilar patent to its distal aspect without stenosis. Superior
cerebellar arteries patent bilaterally. Both PCA supplied via the
basilar as well as prominent bilateral posterior communicating
arteries. PCAs patent to their distal aspects without definite
proximal stenosis.

No visible aneurysm or other vascular abnormality.
IMPRESSION: 1. Technically limited exam due to extensive motion artifact.
2. Grossly negative MRA of the head and neck. No large vessel
occlusion. No proximal high-grade or correctable stenosis.

## 2021-04-17 IMAGING — MR MR MRA HEAD W/O CM
1 series · 18 of 48 positions shown · non-contrast
Comparison: Prior MRI from [DATE].

CLINICAL DATA: Initial evaluation for acute stroke.

EXAM:
MRA NECK WITHOUT CONTRAST
MRA HEAD WITHOUT CONTRAST
TECHNIQUE: Multiplanar and multiecho pulse sequences of the neck were obtained
without intravenous contrast. Angiographic images of the neck were
obtained using MRA technique without and with intravenous contrast;
Angiographic images of the Circle of Willis were obtained using MRA
technique without intravenous contrast.

[Series 5: 3d cow · axial · 0.5mm · 0.41mm/px · z∈[-122,-42]mm · 18 of 172 slices shown]
[im 1/172]
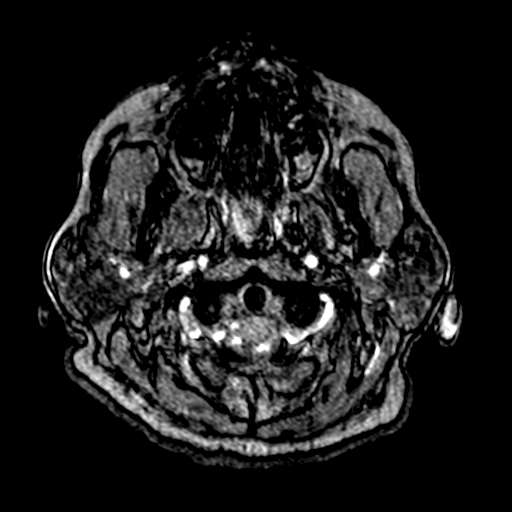
[im 4/172]
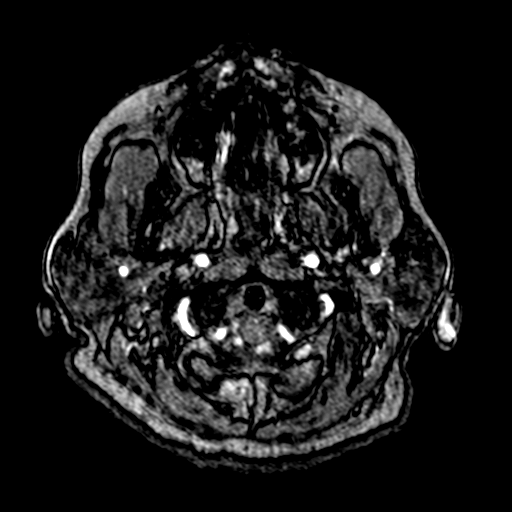
[im 8/172]
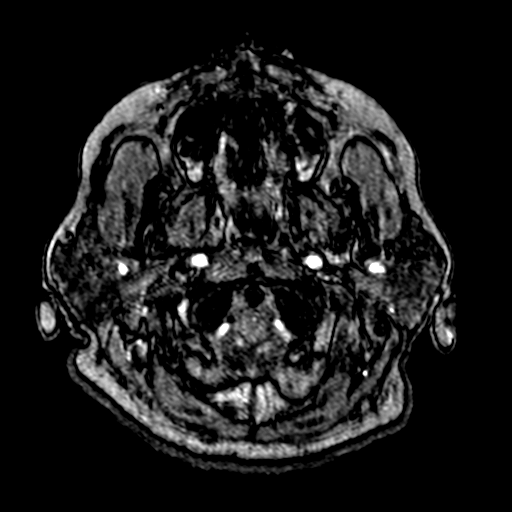
[im 11/172]
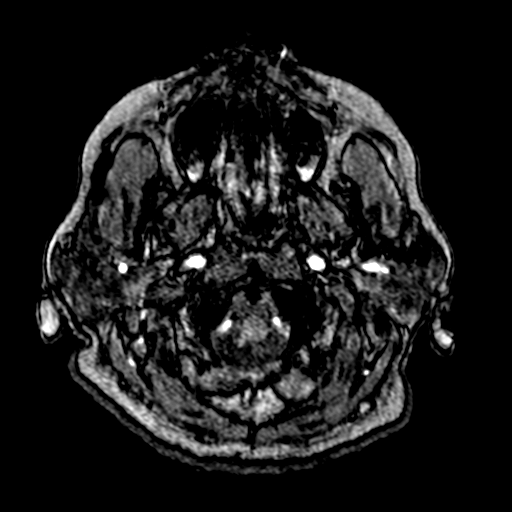
[im 15/172]
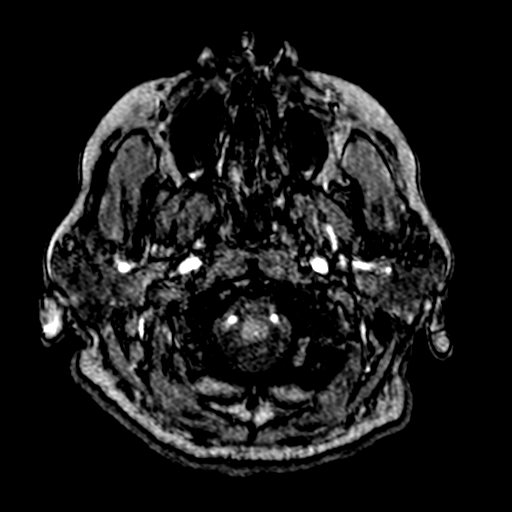
[im 19/172]
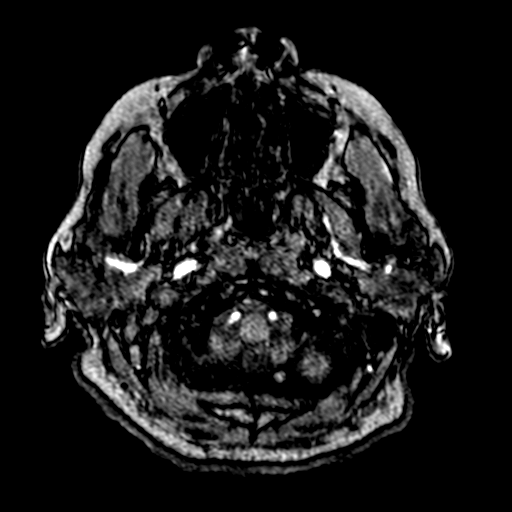
[im 22/172]
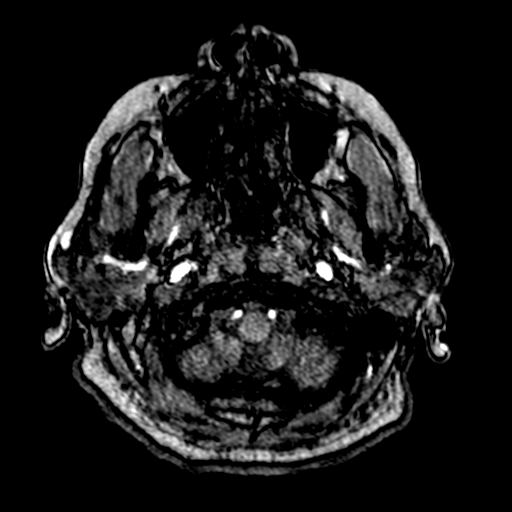
[im 26/172]
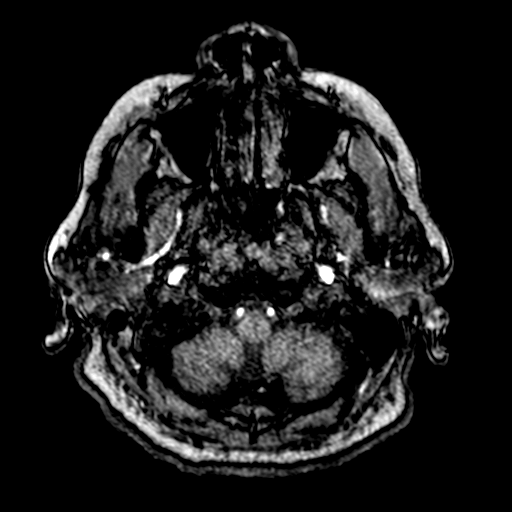
[im 30/172]
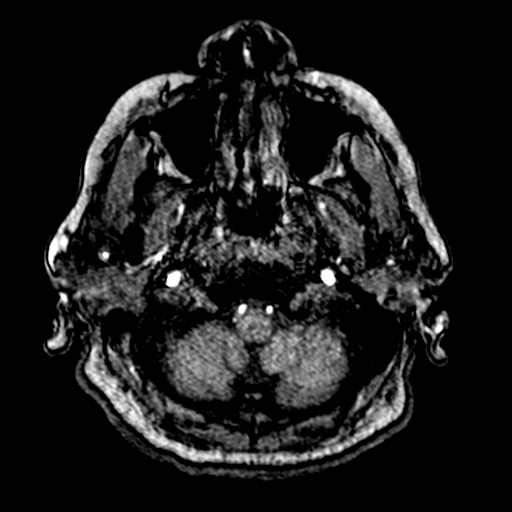
[im 33/172]
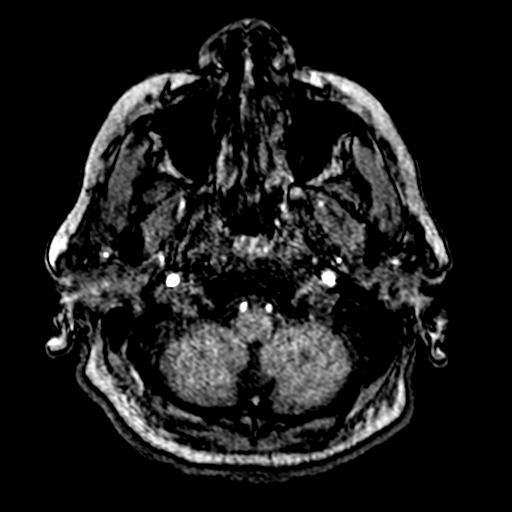
[im 55/172]
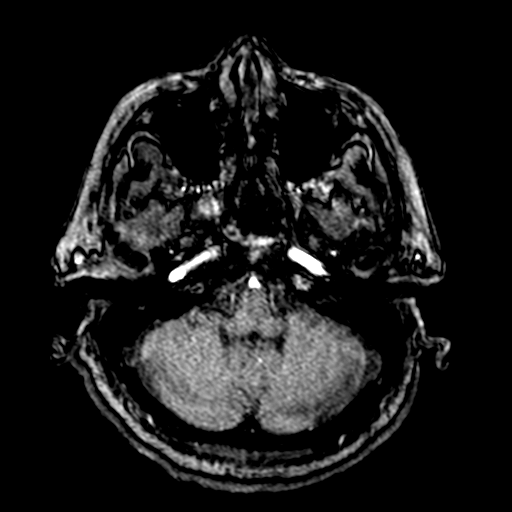
[im 77/172]
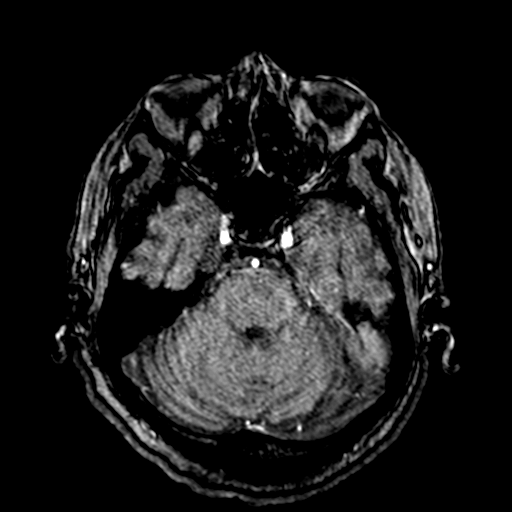
[im 88/172]
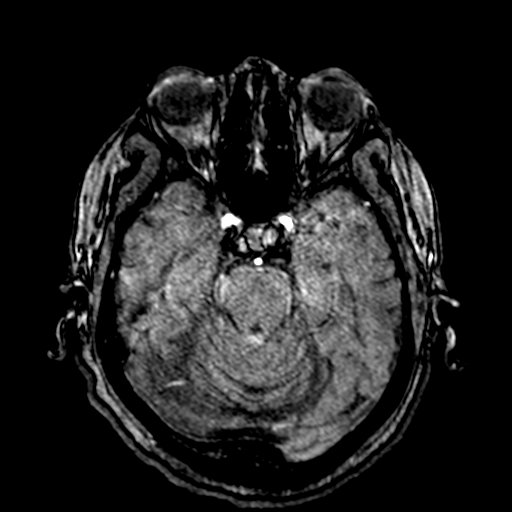
[im 99/172]
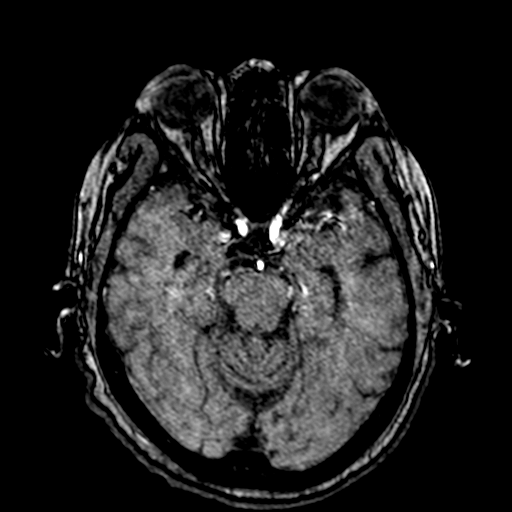
[im 121/172]
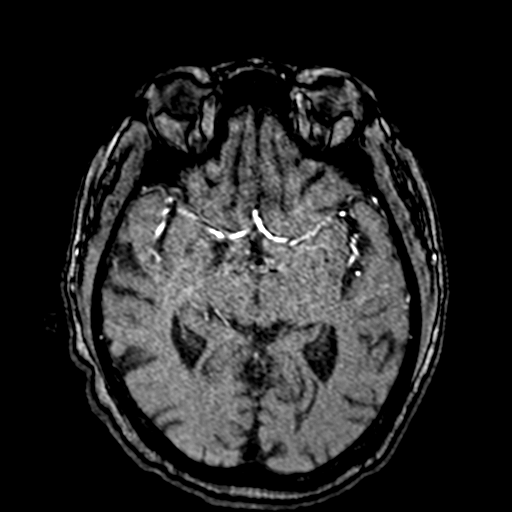
[im 142/172]
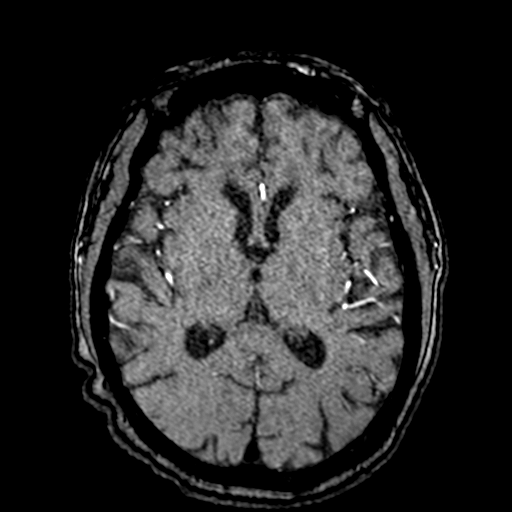
[im 146/172]
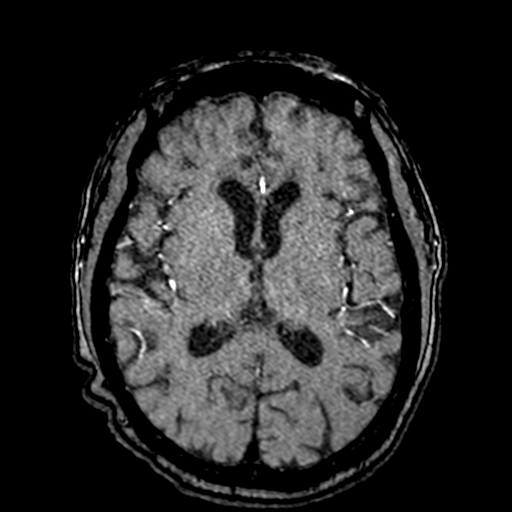
[im 164/172]
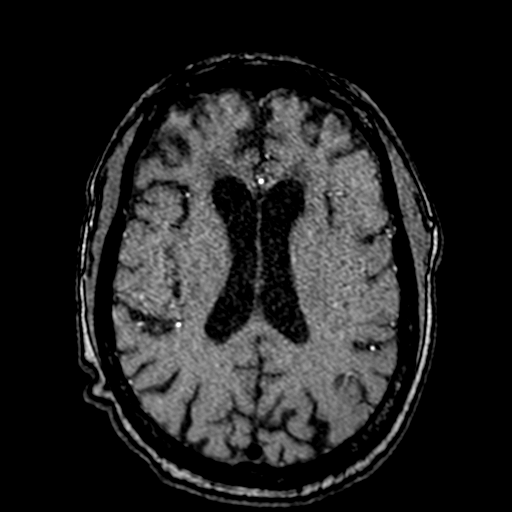

[18 of 48 positions shown; findings below may reference images not displayed]

FINDINGS: MRA NECK FINDINGS

AORTIC ARCH: Examination severely degraded by motion artifact.

Partially visualized aortic arch grossly normal in caliber with
normal branch pattern. No appreciable stenosis about the origin of
the great vessels.

RIGHT CAROTID SYSTEM: Visualized right CCA patent without stenosis.
No appreciable stenosis or significant irregularity about the right
carotid bifurcation. Visualized right ICA patent without stenosis or
occlusion.

LEFT CAROTID SYSTEM: Visualized left CCA patent without stenosis. No
significant atheromatous narrowing or irregularity about the left
bifurcation. Left ICA patent without stenosis or occlusion.

VERTEBRAL ARTERIES: Origins of the vertebral arteries not well
assessed on this exam. Visualized portions of the vertebral arteries
patent with antegrade flow, with no appreciable stenosis or
occlusion.

MRA HEAD FINDINGS

ANTERIOR CIRCULATION:

Examination moderately degraded by motion artifact.

Visualized distal cervical segments of the internal carotid arteries
are patent with antegrade flow. Petrous, cavernous, and supraclinoid
segments patent without stenosis or other abnormality. A1 segments
patent. Normal anterior communicating artery complex. Anterior
cerebral arteries grossly patent to their distal aspects without
appreciable stenosis. M1 segments patent bilaterally. Ill-defined
attenuation involving the distal left M1 segment and proximal M2
branches favored to be artifactual on this exam. No definite
proximal stenosis. MCA branches well perfused and symmetric.

POSTERIOR CIRCULATION:

Vertebral arteries codominant and patent to the vertebrobasilar
junction without stenosis. Both PICA origins patent and normal.
Basilar patent to its distal aspect without stenosis. Superior
cerebellar arteries patent bilaterally. Both PCA supplied via the
basilar as well as prominent bilateral posterior communicating
arteries. PCAs patent to their distal aspects without definite
proximal stenosis.

No visible aneurysm or other vascular abnormality.
IMPRESSION: 1. Technically limited exam due to extensive motion artifact.
2. Grossly negative MRA of the head and neck. No large vessel
occlusion. No proximal high-grade or correctable stenosis.

## 2021-04-17 MED ORDER — KETOROLAC TROMETHAMINE 30 MG/ML IJ SOLN: 30.0000 mg | Freq: Once | INTRAMUSCULAR | Status: DC

## 2021-04-17 MED ORDER — KETOROLAC TROMETHAMINE 15 MG/ML IJ SOLN
15.0000 mg | Freq: Four times a day (QID) | INTRAMUSCULAR | Status: DC | PRN
  Administered 2021-04-17: 15 mg via INTRAVENOUS
  Filled 2021-04-17: qty 1

## 2021-04-17 MED ORDER — CHLORHEXIDINE GLUCONATE CLOTH 2 % EX PADS
6.0000 | MEDICATED_PAD | Freq: Every day | CUTANEOUS | Status: DC
  Administered 2021-04-17 – 2021-04-20 (×4): 6 via TOPICAL

## 2021-04-17 MED ORDER — CLOPIDOGREL BISULFATE 300 MG PO TABS
300.0000 mg | ORAL_TABLET | Freq: Once | ORAL | Status: AC
  Administered 2021-04-17: 300 mg via ORAL
  Filled 2021-04-17: qty 1

## 2021-04-17 MED ORDER — ALPRAZOLAM 0.5 MG PO TABS
0.5000 mg | ORAL_TABLET | Freq: Three times a day (TID) | ORAL | Status: DC | PRN
  Administered 2021-04-17: 0.5 mg via ORAL
  Filled 2021-04-17: qty 1

## 2021-04-17 MED ORDER — SODIUM CHLORIDE 0.9 % IV SOLN
8.0000 mg | Freq: Four times a day (QID) | INTRAVENOUS | Status: DC | PRN
  Filled 2021-04-17 (×2): qty 4

## 2021-04-17 MED ORDER — CLOPIDOGREL BISULFATE 75 MG PO TABS: 75.0000 mg | ORAL_TABLET | Freq: Every day | ORAL | Status: DC

## 2021-04-17 NOTE — Progress Notes (Signed)
@  31, Pts spouse voiced some concerns about pt being possibly dehydrated and stated he felt pt was getting agitated. Wanted MD to be informed of his concerns.   MD notified and will continue to monitor.

## 2021-04-17 NOTE — Progress Notes (Signed)
Inpatient Rehab Admissions Coordinator Note:   Per therapy recommendations, pt was screened for CIR candidacy by Helmer Dull, MS CCC-SLP. At this time, Pt. Appears to have functional decline and is a potential  candidate for CIR. Will place order for rehab consult per protocol.  Please contact me with questions.   Natsumi Whitsitt, MS, CCC-SLP Rehab Admissions Coordinator  [phone] (celll) [phone] (office)  

## 2021-04-17 NOTE — Progress Notes (Addendum)
STROKE TEAM PROGRESS NOTE   INTERVAL HISTORY Her husband is at the bedside.  He is a retired Chief Strategy Officer. She has notable difficulty with her oral secretions and left facial weakness. Will make npo till SLP can assess swallow. Her main c/o is of right hip bursitis pain and some nausea. Will order some IV zofran and Toradol.   Vitals:   04/17/21 0236 04/17/21 0437 04/17/21 0716 04/17/21 0831  BP: (!) 174/74 (!) 161/61 (!) 142/70   Pulse: 86 81 84   Resp: 14 14 13    Temp:   98.5 F (36.9 C)   TempSrc:   Axillary   SpO2: 96% 96% 98% 99%  Weight:      Height:       CBC:  Recent Labs  Lab 04/16/21 1215 04/17/21 0224  WBC 9.0 9.9  NEUTROABS 4.2 3.6  HGB 9.0* 8.9*  HCT 27.3* 26.1*  MCV 88.1 85.9  PLT 282 235   Basic Metabolic Panel:  Recent Labs  Lab 04/16/21 1215 04/17/21 0224  NA 136 136  K 4.0 3.6  CL 96* 97*  CO2 34* 29  GLUCOSE 192* 90  BUN 12 16  CREATININE 3.02* 4.08*  CALCIUM 8.2* 8.8*  PHOS  --  3.7   Lipid Panel:  Recent Labs  Lab 04/16/21 1840  CHOL 135  TRIG 89  HDL 47  CHOLHDL 2.9  VLDL 18  LDLCALC 70   HgbA1c:  Recent Labs  Lab 04/16/21 1840  HGBA1C 5.2   Urine Drug Screen: No results for input(s): LABOPIA, COCAINSCRNUR, LABBENZ, AMPHETMU, THCU, LABBARB in the last 168 hours.  Alcohol Level No results for input(s): ETH in the last 168 hours.  IMAGING past 24 hours MR ANGIO HEAD WO CONTRAST  Result Date: 04/17/2021 CLINICAL DATA:  Initial evaluation for acute stroke. EXAM: MRA NECK WITHOUT CONTRAST MRA HEAD WITHOUT CONTRAST TECHNIQUE: Multiplanar and multiecho pulse sequences of the neck were obtained without intravenous contrast. Angiographic images of the neck were obtained using MRA technique without and with intravenous contrast; Angiographic images of the Circle of Willis were obtained using MRA technique without intravenous contrast. COMPARISON:  Prior MRI from 04/16/2021. FINDINGS: MRA NECK FINDINGS AORTIC ARCH: Examination severely  degraded by motion artifact. Partially visualized aortic arch grossly normal in caliber with normal branch pattern. No appreciable stenosis about the origin of the great vessels. RIGHT CAROTID SYSTEM: Visualized right CCA patent without stenosis. No appreciable stenosis or significant irregularity about the right carotid bifurcation. Visualized right ICA patent without stenosis or occlusion. LEFT CAROTID SYSTEM: Visualized left CCA patent without stenosis. No significant atheromatous narrowing or irregularity about the left bifurcation. Left ICA patent without stenosis or occlusion. VERTEBRAL ARTERIES: Origins of the vertebral arteries not well assessed on this exam. Visualized portions of the vertebral arteries patent with antegrade flow, with no appreciable stenosis or occlusion. MRA HEAD FINDINGS ANTERIOR CIRCULATION: Examination moderately degraded by motion artifact. Visualized distal cervical segments of the internal carotid arteries are patent with antegrade flow. Petrous, cavernous, and supraclinoid segments patent without stenosis or other abnormality. A1 segments patent. Normal anterior communicating artery complex. Anterior cerebral arteries grossly patent to their distal aspects without appreciable stenosis. M1 segments patent bilaterally. Ill-defined attenuation involving the distal left M1 segment and proximal M2 branches favored to be artifactual on this exam. No definite proximal stenosis. MCA branches well perfused and symmetric. POSTERIOR CIRCULATION: Vertebral arteries codominant and patent to the vertebrobasilar junction without stenosis. Both PICA origins patent and normal. Basilar patent to its  distal aspect without stenosis. Superior cerebellar arteries patent bilaterally. Both PCA supplied via the basilar as well as prominent bilateral posterior communicating arteries. PCAs patent to their distal aspects without definite proximal stenosis. No visible aneurysm or other vascular abnormality.  IMPRESSION: 1. Technically limited exam due to extensive motion artifact. 2. Grossly negative MRA of the head and neck. No large vessel occlusion. No proximal high-grade or correctable stenosis. Electronically Signed   By: Jeannine Boga M.D.   On: 04/17/2021 03:36   MR ANGIO NECK WO CONTRAST  Result Date: 04/17/2021 CLINICAL DATA:  Initial evaluation for acute stroke. EXAM: MRA NECK WITHOUT CONTRAST MRA HEAD WITHOUT CONTRAST TECHNIQUE: Multiplanar and multiecho pulse sequences of the neck were obtained without intravenous contrast. Angiographic images of the neck were obtained using MRA technique without and with intravenous contrast; Angiographic images of the Circle of Willis were obtained using MRA technique without intravenous contrast. COMPARISON:  Prior MRI from 04/16/2021. FINDINGS: MRA NECK FINDINGS AORTIC ARCH: Examination severely degraded by motion artifact. Partially visualized aortic arch grossly normal in caliber with normal branch pattern. No appreciable stenosis about the origin of the great vessels. RIGHT CAROTID SYSTEM: Visualized right CCA patent without stenosis. No appreciable stenosis or significant irregularity about the right carotid bifurcation. Visualized right ICA patent without stenosis or occlusion. LEFT CAROTID SYSTEM: Visualized left CCA patent without stenosis. No significant atheromatous narrowing or irregularity about the left bifurcation. Left ICA patent without stenosis or occlusion. VERTEBRAL ARTERIES: Origins of the vertebral arteries not well assessed on this exam. Visualized portions of the vertebral arteries patent with antegrade flow, with no appreciable stenosis or occlusion. MRA HEAD FINDINGS ANTERIOR CIRCULATION: Examination moderately degraded by motion artifact. Visualized distal cervical segments of the internal carotid arteries are patent with antegrade flow. Petrous, cavernous, and supraclinoid segments patent without stenosis or other abnormality. A1  segments patent. Normal anterior communicating artery complex. Anterior cerebral arteries grossly patent to their distal aspects without appreciable stenosis. M1 segments patent bilaterally. Ill-defined attenuation involving the distal left M1 segment and proximal M2 branches favored to be artifactual on this exam. No definite proximal stenosis. MCA branches well perfused and symmetric. POSTERIOR CIRCULATION: Vertebral arteries codominant and patent to the vertebrobasilar junction without stenosis. Both PICA origins patent and normal. Basilar patent to its distal aspect without stenosis. Superior cerebellar arteries patent bilaterally. Both PCA supplied via the basilar as well as prominent bilateral posterior communicating arteries. PCAs patent to their distal aspects without definite proximal stenosis. No visible aneurysm or other vascular abnormality. IMPRESSION: 1. Technically limited exam due to extensive motion artifact. 2. Grossly negative MRA of the head and neck. No large vessel occlusion. No proximal high-grade or correctable stenosis. Electronically Signed   By: Jeannine Boga M.D.   On: 04/17/2021 03:36   MR BRAIN WO CONTRAST  Result Date: 04/16/2021 CLINICAL DATA:  Initial evaluation for acute stroke. EXAM: MRI HEAD WITHOUT CONTRAST TECHNIQUE: Multiplanar, multiecho pulse sequences of the brain and surrounding structures were obtained without intravenous contrast. COMPARISON:  Prior head CT from 04/14/2021. FINDINGS: Brain: Cerebral volume within normal limits for age. Patchy and hazy T2/FLAIR hyperintensity within the periventricular deep white matter both cerebral hemispheres most consistent with chronic small vessel ischemic disease, moderately advanced in nature. 2.1 cm focus of restricted diffusion seen involving the right basal ganglia, consistent with an acute ischemic infarct. No associated hemorrhage or mass effect. No other evidence for acute or subacute ischemia. Gray-white matter  differentiation otherwise maintained. No other areas of acute or  subacute infarction. No encephalomalacia to suggest chronic cortical infarction. No foci of susceptibility artifact to suggest acute or chronic intracranial hemorrhage. No mass lesion, midline shift or mass effect. No hydrocephalus or extra-axial fluid collection. Pituitary gland within normal limits. Suprasellar region normal. Midline structures intact. Vascular: Major intracranial vascular flow voids are maintained. Skull and upper cervical spine: Craniocervical junction within normal limits. Bone marrow signal intensity normal. No scalp soft tissue abnormality. Sinuses/Orbits: Patient status post bilateral ocular lens replacement. Globes and orbital soft tissues demonstrate no acute finding. Mild scattered mucosal thickening noted within the ethmoidal air cells. Paranasal sinuses are otherwise clear. No mastoid effusion. Inner ear structures grossly normal. Other: None. IMPRESSION: 1. 2.1 cm acute ischemic nonhemorrhagic right basal ganglia infarct. 2. Underlying moderate chronic microvascular ischemic disease. Electronically Signed   By: Jeannine Boga M.D.   On: 04/16/2021 21:16   DG CHEST PORT 1 VIEW  Result Date: 04/16/2021 CLINICAL DATA:  Stroke. EXAM: PORTABLE CHEST 1 VIEW COMPARISON:  Chest x-ray dated Mar 23, 2021. FINDINGS: Unchanged tunneled right internal jugular dialysis catheter. Stable cardiomegaly. Unchanged mild left basilar atelectasis. No focal consolidation, pleural effusion, or pneumothorax. No acute osseous abnormality. IMPRESSION: No active disease. Electronically Signed   By: Titus Dubin M.D.   On: 04/16/2021 20:32    PHYSICAL EXAM General: Appears well-developed ; mod distress from right hip/bursitis pain. Psych: Affect appropriate to situation Eyes: No scleral injection HENT: No OP obstrucion Head: Normocephalic.  Cardiovascular: Normal rate and regular rhythm.  Respiratory: Effort normal and breath  sounds normal to anterior ascultation GI: Soft.  No distension. There is no tenderness.  Skin: WDI    Neurological Examination Mental Status: Alert, oriented, thought content appropriate.  Speech fluent without evidence of aphasia. Able to follow 3 step commands without difficulty. Cranial Nerves: II: Visual fields grossly normal,  III,IV, VI: ptosis not present, extra-ocular motions intact bilaterally, pupils equal, round, reactive to light and accommodation V,VII: Left facial droop. slight lack of sensation on left side of face.  VIII: hearing normal bilaterally IX,X: uvula rises symmetrically XI: decreased shoulder shrug on left- c/o shoulder pain XII: midline tongue extension Motor:Tone and bulk:normal tone throughout; no atrophy noted. Rt is 5/5. Left: 3/5 arm, 4/5 leg Sensory: States she feels equal to light touch bilaterally, no extinction Deep Tendon Reflexes: 2+ and symmetric throughout Cerebellar: not out of proportion to weakness Gait: normal gait and station   ASSESSMENT/PLAN Carolyn Russo is a 70 y.o. female with subcortical infarct on the right, likely due to her multiple small vessel risk factors.  Stroke: right subcortical; small vessel disease source MRI  right subcortical MRA  motion artifact, no LVO or high grade stenosis 2D Echo no need to repeat since just done 5/19: EF50%LA mild dilation LDL 70 HgbA1c 5.2 VTE prophylaxis - heparin sq    Diet   Diet renal/carb modified with fluid restriction Diet-HS Snack? Nothing; Fluid restriction: 1200 mL Fluid; Room service appropriate? Yes; Fluid consistency: Thin   aspirin 81 mg daily prior to admission, now on aspirin 81 mg daily and clopidogrel 75 mg daily.   Therapy recommendations:  pending Disposition:  pending  Hypertension Home meds:  Coreg 3.125mg ,  Stable Permissive hypertension (OK if < 220/120) but gradually normalize in 5-7 days Long-term BP goal normotensive  Hyperlipidemia Home meds:   Crestor 20mg , resumed in hospital LDL 70, goal < 70 No change in statin needed Continue statin at discharge  Diabetes type II Controlled Home meds:  Novolog  HgbA1c 5.2, goal < 7.0 CBGs Recent Labs    04/14/21 1814 04/16/21 1742 04/17/21 0728  GLUCAP 146* 139* 105*    SSI  Other Stroke Risk Factors Advanced Age >/= 65  Obesity, Body mass index is 33.84 kg/m., BMI >/= 30 associated with increased stroke risk, recommend weight loss, diet and exercise as appropriate   Other Active Problems CKD4- recently started HD, has a port while awaiting fistula placement Dysphasia- Made NPO till SLP can evaluate Chronic hip pain with bursitis- I have ordered IV Toradol since NPO Nausea- I have ordered IV zofran Asthma GERD  Hospital day # 1  Desiree Metzger-Cihelka, ARNP-C, ANVP-BC Pager: [phone]   ATTENDING NOTE: I reviewed above note and agree with the assessment and plan. Pt was seen and examined.   70 year old female with history of obesity, diabetes, hypertension, hyperlipidemia, ESRD on dialysis admitted for left-sided weakness for 3 days.  CT no acute abnormality on 6/22, however MRI in 6/24 showed right BG/CR infarct.  MRA head and neck negative.  EF 50% on 03/11/2021.  A1c 5.2, LDL 70, creatinine 4.08, hemoglobin 8.9.  On exam, patient sleepy but eyes open easily with voice, orientated to age, place, time and people. No aphasia, fluent language, following all simple commands. Able to name and repeat. No gaze palsy, tracking bilaterally, visual field full. Left facial droop. Tongue midline. LUE 3/5 deltoid, 3+/5 bicep, 3/5 tricep, 4/5 finger grip, LLE proximal 3+/5 and distal 4+/5. Sensation symmetrical bilaterally, b/l FTN intact grossly although left side slow, gait not tested.   Etiology for patient stroke likely due to small vessel disease.  Continue stroke risk factor modification.  Continue aspirin 81 and Plavix for 3 weeks and then Plavix alone.  Continue Crestor 20.   PT/OT recommend CIR.  For detailed assessment and plan, please refer to above as I have made changes wherever appropriate.   Neurology will sign off. Please call with questions. Pt will follow up with stroke clinic NP at [facility] in about 4 weeks. Thanks for the consult.   Rosalin Hawking, MD PhD Stroke Neurology 04/17/2021 12:31 PM      To contact Stroke Continuity provider, please refer to [email or web address] After hours, contact General Neurology

## 2021-04-17 NOTE — Progress Notes (Addendum)
TR[facility] night shift.  Rapid response was called on this patient due to continued GI bleeding.  Her vital signs have been stable.  However, the patient is fatigue and somnolent.  Please see rapid response event note for further details.  Type and screen and H&H ordered.  She is currently n.p.o. so we will monitor her CBGs every 4 hours.  Tennis Must, MD  04/18/2021 addendum: [id number] The nursing staff just reported that the patient's lower GI bleed still persisting.  Her hemoglobin level was 7.2 g/dL earlier, but it was 9.0 g/dL at 1215 on 04/16/2021.  A 2 unit PRBC transfusion started.  Tennis Must, MD.

## 2021-04-17 NOTE — Evaluation (Signed)
Physical Therapy Evaluation Patient Details Name: Carolyn Russo MRN: [id number] DOB: 09-03-1951 Today's Date: 04/17/2021   History of Present Illness  70 y.o. female presented 04/16/21 with left-sided weakness that started about 3 days PTA. MRI brain acute ischemic nonhemorrhagic right basal ganglia infarct PMH diabetes, hypertension, hyperlipidemia, ESRD on HD MWF  Clinical Impression   Pt admitted secondary to problem above with deficits below. PTA patient was weak from recent hospitalization and was walking with cane and receiving HHPT.  Pt currently requires min assist for stand-pivot transfers and will need 2 person assist to safely ambulate due to LLE weakness and postural instability in standing.  Anticipate patient will benefit from PT to address problems listed below.Will continue to follow acutely to maximize functional mobility independence and safety.       Follow Up Recommendations CIR    Equipment Recommendations  Rolling walker with 5" wheels    Recommendations for Other Services Rehab consult;OT consult;Speech consult     Precautions / Restrictions Precautions Precautions: Fall      Mobility  Bed Mobility Overal bed mobility: Needs Assistance Bed Mobility: Rolling;Sidelying to Sit Rolling: Supervision Sidelying to sit: Min guard       General bed mobility comments: rolling with RUE using rail; side to sit pt asking for assist however with cues and incr effort was able to come to sit EOB without physical assist    Transfers Overall transfer level: Needs assistance Equipment used: Straight cane Transfers: Sit to/from Stand;Stand Pivot Transfers Sit to Stand: Min assist Stand pivot transfers: Min assist       General transfer comment: postural instability with unweighting each leg to advance  Ambulation/Gait             General Gait Details: unable to safely with 1 person and lines  Stairs            Wheelchair Mobility    Modified  Rankin (Stroke Patients Only)       Balance Overall balance assessment: Needs assistance   Sitting balance-Leahy Scale: Fair       Standing balance-Leahy Scale: Poor                               Pertinent Vitals/Pain Pain Assessment: Faces Faces Pain Scale: Hurts a little bit Pain Location: rt hip bursitis Pain Descriptors / Indicators: Aching Pain Intervention(s): Limited activity within patient's tolerance;Monitored during session    [place] expects to be discharged to:: Private residence Living Arrangements: Spouse/significant other Available Help at Discharge: Family Type of Home: House Home Access: Stairs to enter Entrance Stairs-Rails: Right Entrance Stairs-Number of Steps: 4 Home Layout: Two level;Able to live on main level with bedroom/bathroom Home Equipment: Kasandra Knudsen - single point;Bedside commode;Walker - 4 wheels (has BSC over toilet to incr height; rollator is pt's husband that he does not use (uses cane))      Prior Function Level of Independence: Independent with assistive device(s)         Comments: was using cane PTA (had prolonged recent hospitalization with resulting weakness and was receiving HHPT); cannot shower due to port-a-cath in rt upper chest     Hand Dominance        Extremity/Trunk Assessment   Upper Extremity Assessment Upper Extremity Assessment: Defer to OT evaluation    Lower Extremity Assessment Lower Extremity Assessment: LLE deficits/detail (mild weakness compared to RLE (Lt 3+ to 4; rt 4 to 4+))  Cervical / Trunk Assessment Cervical / Trunk Assessment: Other exceptions Cervical / Trunk Exceptions: decr postural stability in standing  Communication   Communication: HOH  Cognition Arousal/Alertness: Awake/alert Behavior During Therapy: Flat affect Overall Cognitive Status: Within Functional Limits for tasks assessed                                 General Comments:  oriented to self place time situation (but not CVA); followed multi-step commands; some decr awareness of safety/deficits as she started to walk before PT completed standing assessment      General Comments General comments (skin integrity, edema, etc.): husband present and asking appropriate questions re: recovery process; pt denied issues wiht incontinence, however immediately upon standing was incontinent of urine--on questioning she then admitted she normally wears "a pad" Multiple standing transfers for changing gowns, chair linens, underwear, placing purewick, etc    Exercises     Assessment/Plan    PT Assessment Patient needs continued PT services  PT Problem List Decreased strength;Decreased balance;Decreased mobility;Decreased cognition;Decreased knowledge of use of DME;Decreased safety awareness;Obesity       PT Treatment Interventions DME instruction;Gait training;Stair training;Functional mobility training;Therapeutic activities;Balance training;Neuromuscular re-education;Cognitive remediation;Patient/family education    PT Goals (Current goals can be found in the Care Plan section)  Acute Rehab PT Goals Patient Stated Goal: regain ability to walk PT Goal Formulation: With patient/family Time For Goal Achievement: 05/01/21 Potential to Achieve Goals: Good    Frequency Min 4X/week   Barriers to discharge        Co-evaluation               AM-PAC PT "6 Clicks" Mobility  Outcome Measure Help needed turning from your back to your side while in a flat bed without using bedrails?: A Little Help needed moving from lying on your back to sitting on the side of a flat bed without using bedrails?: A Little Help needed moving to and from a bed to a chair (including a wheelchair)?: A Little Help needed standing up from a chair using your arms (e.g., wheelchair or bedside chair)?: A Little Help needed to walk in hospital room?: Total Help needed climbing 3-5 steps with a  railing? : Total 6 Click Score: 14    End of Session Equipment Utilized During Treatment: Gait belt Activity Tolerance: Patient tolerated treatment well Patient left: in chair;with call bell/phone within reach;with chair alarm set;with family/visitor present Nurse Communication: Mobility status;Other (comment) (rec BSC or +2 to walk to bathroom) PT Visit Diagnosis: Hemiplegia and hemiparesis Hemiplegia - Right/Left: Left Hemiplegia - dominant/non-dominant: Non-dominant Hemiplegia - caused by: Cerebral infarction    Time: 1012-1059 PT Time Calculation (min) (ACUTE ONLY): 47 min   Charges:   PT Evaluation $PT Eval Moderate Complexity: 1 Mod PT Treatments $Therapeutic Activity: 23-37 mins         Arby Barrette, PT Pager [phone]   Rexanne Mano 04/17/2021, 11:23 AM

## 2021-04-17 NOTE — Consult Note (Signed)
Neurology Consultation Reason for Consult: Stroke Referring Physician: Tamala Julian, R  CC: Left-sided weakness  History is obtained from: Patient, chart  HPI: Carolyn Russo is a 70 y.o. female with a history of diabetes, hypertension, hyperlipidemia who presents with left-sided weakness that started about 3 days ago. She has also had some nausea and vomiting. She was getting somewhat better initially, then worsened this morning(Friday) and therefore came to the ER for further evaluation after going to outpatient HD. MRI reveals a subcortical stroke.    LKW: About 3 days ago tpa given?: no, outside of window  ROS: A 14 point ROS was performed and is negative except as noted in the HPI.  Past Medical History:  Diagnosis Date   Allergy    Anterior chest wall pain    Appendicitis 1965   Asthma    Body mass index 37.0-37.9, adult    Breast pain    Cataract    both eyes   Chronic renal disease, stage II    Dehydration 2014   Deviated septum 1971   Diabetes mellitus    Dyspnea 2014   Extrinsic asthma    WITH ASTHMA ATTACK   Fibroid 1980   GERD (gastroesophageal reflux disease)    Heart murmur    Hx gestational diabetes    Hyperlipidemia    Hypertension 2014   Inguinal hernia 1959   Malaise and fatigue 2014   Nephropathy    Non-IgE mediated allergic asthma 2014   Obesity    Pelvic pain    Pregnancy, high-risk 1985   Tonsillitis 1968   Uterine fibroid 1980     Family History  Problem Relation Age of Onset   Cancer Mother        abdominal melamona   Psoriasis Mother    Alzheimer's disease Father    Cancer Cousin        colon    Diabetes Cousin    Diabetes Maternal Aunt    Colon cancer Neg Hx    Colon polyps Neg Hx    Esophageal cancer Neg Hx    Rectal cancer Neg Hx    Stomach cancer Neg Hx    Breast cancer Neg Hx      Social History:  reports that she has never smoked. She has never used smokeless tobacco. She reports that she does not drink alcohol and does not  use drugs.   Exam: Current vital signs: BP (!) 165/73   Pulse 85   Temp 97.8 F (36.6 C) (Oral)   Resp 17   Ht 5\' 2"  (1.575 m)   Wt 83.9 kg   SpO2 96%   BMI 33.84 kg/m  Vital signs in last 24 hours: Temp:  [97.8 F (36.6 C)] 97.8 F (36.6 C) (06/24 1732) Pulse Rate:  [83-91] 85 (06/24 1732) Resp:  [15-20] 17 (06/24 1732) BP: (117-213)/(60-155) 165/73 (06/24 1923) SpO2:  [96 %-100 %] 96 % (06/24 1732) Weight:  [83.9 kg] 83.9 kg (06/24 1154)   Physical Exam  Constitutional: Appears well-developed and well-nourished.  Psych: Affect appropriate to situation Eyes: No scleral injection HENT: No OP obstruction MSK: no joint deformities.  Cardiovascular: Normal rate and regular rhythm.  Respiratory: Effort normal, non-labored breathing GI: Soft.  No distension. There is no tenderness.  Skin: WDI  Neuro: Mental Status: Patient is awake, alert, oriented to person, place, month, year, and situation. Patient is able to give a clear and coherent history. No signs of aphasia or neglect Cranial Nerves: II: Visual Fields  are full. Pupils are equal, round, and reactive to light.   III,IV, VI: EOMI without ptosis or diploplia.  V: Facial sensation is diminished on the left VII: Facial movement is diminished on the left VIII: hearing is intact to voice X: Uvula elevates symmetrically XI: Shoulder shrug is symmetric. XII: tongue is midline without atrophy or fasciculations.  Motor: Tone is normal. Bulk is normal. 5/5 strength was present on the right, she has 4/5 strength both left arm and leg Sensory: Sensation is diminished on the left Cerebellar: No clear ataxia    I have reviewed labs in epic and the results pertinent to this consultation are: Creatinine 3.02  I have reviewed the images obtained: MRI brain-right subcortical infarct  Impression: 70 year old female with subcortical infarct on the right, likely due to her multiple small vessel risk factors.  She will  need to be evaluated for therapy as well as secondary risk factor modification.  Recommendations: - HgbA1c, fasting lipid panel -  MRA  of the brain without contrast, MRA neck without contrast - Frequent neuro checks - Echocardiogram - Prophylactic therapy-Antiplatelet med: Aspirin - dose 325mg  PO or 300mg  PR - Risk factor modification - Telemetry monitoring - PT consult, OT consult, Speech consult - Stroke team to follow    Roland Rack, MD [facility] [phone]  If 7pm- 7am, please page neurology on call as listed in [place].

## 2021-04-17 NOTE — Significant Event (Signed)
Rapid Response Event Note   Reason for Call :  Continue GI bleeding. VS currently stable per bedside RN.  Initial Focused Assessment:  Pt lying in bed with eyes open, in no distress. She denies CP/SOB/abd pain. She is very delayed with her responses. She has a L facial droop/slurred speech/and L sided weakness which is baseline for her after her stroke. Lungs clear and diminished. Skin warm and dry.   HR-79, BP-118/49, RR-19, SpO2-100% on RA   Interventions:  CBG-207 Q4h CBGs T and S, H/H now  Plan of Care:  Continue to monitor pt closely. VS currently stable.  Will check CBGs q4h. Await H/H results and notifiy MD if hgb drops further. Call RRT if further assistance needed.  Event Summary:   MD Notified: Dr. Olevia Bowens  Call [place] [phone] End [id number]  Dillard Essex, RN

## 2021-04-17 NOTE — Progress Notes (Signed)
OT Cancellation Note  Patient Details Name: ICELA GLYMPH MRN: [id number] DOB: Feb 01, 1951   Cancelled Treatment:    Reason Eval/Treat Not Completed: Patient not medically ready.  Patient with RN and MD, patient with abrupt rectal bleeding, large amounts with clots.  OT to continue efforts as appropriate.    Kaisa Wofford D Aliegha Paullin 04/17/2021, 2:56 PM

## 2021-04-17 NOTE — Progress Notes (Signed)
@  1330 I was advised by the NT that the pt was having some blood come through her purewick.    I assessed pt with RN . When trying to get her from chair to Bed the pt was passing a moderate amount of blood clots.(Golf ball size) Pt denied any pain, dizziness or numbness.  MD notified.   MD stated it could be from , either Dialysis or a strong amount of Plavix.

## 2021-04-17 NOTE — Progress Notes (Signed)
PROGRESS NOTE    Carolyn Russo  VVO:[id number] DOB: [birth year] DOA: 04/16/2021 PCP: [name], MD    Brief Narrative:  Carolyn Russo was admitted with the working diagnosis of right subcortical infarct.   70 year old female past medical history for dyslipidemia, type 2 diabetes mellitus, coronary artery disease, iron deficiency anemia, asthma, and end-stage renal disease on hemodialysis who presented with left-sided weakness.  Recent hospitalization 03/09/2021 to 11/30/9483 for diastolic heart failure, volume overload and progressive renal disease, she was started on hemodialysis.   She reported 3 days of left face, shoulder and leg weakness.  Initially she came to the ED, work-up started with head CT but she left before being seen.  At home she continued to have worsening symptoms, now associated with slurred speech and lethargy.  Her husband brought her back to the hospital.  On her initial physical examination blood pressure 148/83, heart rate 89, respiratory rate 17, oxygen saturation 98%, her lungs are clear to auscultation, heart S1-S2, present, rhythmic, soft abdomen, no lower extremity edema, patient had decreased strength on the left side, 4/5.  Left facial droop.  Sodium 136, potassium 4.0, chloride 96, bicarb 34, glucose 92, BUN 12, creatinine 3.0, white count 9.0, hemoglobin 9.0, hematocrit 27.3, platelets 282. SARS COVID-19 negative.  6/22 head CT negative for acute changes.  06/25 patient with abrupt rectal bleeding, large amounts with clots.  Painless.  Assessment & Plan:   Principal Problem:   CVA (cerebral vascular accident) ([place]) Active Problems:   Type 2 diabetes mellitus (HCC)   Dyslipidemia (high LDL; low HDL)   Obesity (BMI 30-39.9)   Chronic diastolic CHF (congestive heart failure) (HCC)   Hypertensive urgency   Anemia of chronic disease   ESRD (end stage renal disease) (HCC)   Acute subcortical right hemisphere CVA. Her speech has improved but continue to  have left facial droop and left sided weakness.   Plan to continue neuro checks Obtain brain and neck MRA and echocardiogram.  Follow up with PT, OT , speech and neurology recommendations.   Considering acute rectal bleeding, will discontinue aspirin and clopidogrel.  Continue with statin therapy.   2. . Acute lower GI bleed, large clots. Patient had clopidogrel load this am 300 mg once   Plan to discontinue aspirin / clopidogrel and check H&H.  Close follow up on hemodynamics. Patient had hemorrhoidal bleeding in the past but not severe.   Continue close follow up on H&H, if persistent bleeding may need endoscopic procedure.   3. ESRD on HD. Clinically euvolemic. Patient had HD yesterday. Will need HD on Monday if patient still hospitalized.   Anemia of chronic renal disease.  Continue with sevalamer for metabolic bone disease.   4. T2DM/ dyslipidemia. Continue close glucose cover and monitoring. Hold on insulin 70/30 and add insulin sliding scale.    Continue with statin therapy.   5. HTN uncontrolled urgency/ diastolic heart failure. Blood pressure is 142/70 mmHg. Continue close blood pressure monitoring.  No clinical sings of heart failure exacerbation.  Continue with carvedilol.   Patient continue to be at high risk for worsening CVA or rectal bleeding   Status is: Inpatient  Remains inpatient appropriate because:Inpatient level of care appropriate due to severity of illness  Dispo: The patient is from: Home              Anticipated d/c is to: Home              Patient currently is not medically stable to  d/c.   Difficult to place patient No   DVT prophylaxis: Scd   Code Status:   full  Family Communication:  I spoke with patient's husband at the bedside, we talked in detail about patient's condition, plan of care and prognosis and all questions were addressed.     Consultants:  Neurology    Subjective: Patient with improved slurred speech, but continue  with left sided weakness and left facial droop.   Objective: Vitals:   04/17/21 0236 04/17/21 0437 04/17/21 0716 04/17/21 0831  BP: (!) 174/74 (!) 161/61 (!) 142/70   Pulse: 86 81 84   Resp: 14 14 13    Temp:   98.5 F (36.9 C)   TempSrc:   Axillary   SpO2: 96% 96% 98% 99%  Weight:      Height:       No intake or output data in the 24 hours ending 04/17/21 1425 Filed Weights   04/16/21 1154  Weight: 83.9 kg    Examination:   General: Not in pain or dyspnea deconditioned  Neurology: Awake and alert, left facial drop, left sided weakness 4/5 E ENT: mild pallor, no icterus, oral mucosa moist Cardiovascular: No JVD. S1-S2 present, rhythmic, no gallops, rubs, or murmurs. No lower extremity edema. Pulmonary: positive breath sounds bilaterally, adequate air movement, no wheezing, rhonchi or rales. Gastrointestinal. Abdomen soft and non tender Skin. No rashes Musculoskeletal: no joint deformities     Data Reviewed: I have personally reviewed following labs and imaging studies  CBC: Recent Labs  Lab 04/14/21 1824 04/14/21 1833 04/16/21 1215 04/17/21 0224  WBC 9.9  --  9.0 9.9  NEUTROABS 5.4  --  4.2 3.6  HGB 8.8* 10.5* 9.0* 8.9*  HCT 26.7* 31.0* 27.3* 26.1*  MCV 87.5  --  88.1 85.9  PLT 265  --  282 606   Basic Metabolic Panel: Recent Labs  Lab 04/14/21 1824 04/14/21 1833 04/16/21 1215 04/17/21 0224  NA 135 135 136 136  K 4.0 4.0 4.0 3.6  CL 94* 93* 96* 97*  CO2 32  --  34* 29  GLUCOSE 143* 139* 192* 90  BUN 14 16 12 16   CREATININE 3.58* 3.60* 3.02* 4.08*  CALCIUM 8.5*  --  8.2* 8.8*  PHOS  --   --   --  3.7   GFR: Estimated Creatinine Clearance: 12.5 mL/min (A) (by C-G formula based on SCr of 4.08 mg/dL (H)). Liver Function Tests: Recent Labs  Lab 04/14/21 1824 04/17/21 0224  AST 27  --   ALT 21  --   ALKPHOS 59  --   BILITOT 0.7  --   PROT 6.9  --   ALBUMIN 3.4* 3.1*   No results for input(s): LIPASE, AMYLASE in the last 168 hours. No results  for input(s): AMMONIA in the last 168 hours. Coagulation Profile: Recent Labs  Lab 04/14/21 1824  INR 1.0   Cardiac Enzymes: No results for input(s): CKTOTAL, CKMB, CKMBINDEX, TROPONINI in the last 168 hours. BNP (last 3 results) No results for input(s): PROBNP in the last 8760 hours. HbA1C: Recent Labs    04/16/21 1840  HGBA1C 5.2   CBG: Recent Labs  Lab 04/14/21 1814 04/16/21 1742 04/17/21 0728  GLUCAP 146* 139* 105*   Lipid Profile: Recent Labs    04/16/21 1840  CHOL 135  HDL 47  LDLCALC 70  TRIG 89  CHOLHDL 2.9   Thyroid Function Tests: No results for input(s): TSH, T4TOTAL, FREET4, T3FREE, THYROIDAB in the last 72  hours. Anemia Panel: No results for input(s): VITAMINB12, FOLATE, FERRITIN, TIBC, IRON, RETICCTPCT in the last 72 hours.    Radiology Studies: I have reviewed all of the imaging during this hospital visit personally     Scheduled Meds:  arformoterol  15 mcg Nebulization BID   aspirin EC  81 mg Oral Daily   budesonide (PULMICORT) nebulizer solution  0.5 mg Nebulization BID   carvedilol  3.125 mg Oral BID WC   Chlorhexidine Gluconate Cloth  6 each Topical Daily   [START ON 04/18/2021] clopidogrel  75 mg Oral Daily   furosemide  40-80 mg Oral Q M,W,F   heparin  5,000 Units Subcutaneous Q8H   hydrocortisone  25 mg Rectal BID   insulin aspart protamine- aspart  10 Units Subcutaneous BID WC   rosuvastatin  20 mg Oral Daily   sevelamer carbonate  800 mg Oral TID WC   Continuous Infusions:  ondansetron (ZOFRAN) IV       LOS: 1 day        Rheannon Cerney Gerome Apley, MD

## 2021-04-18 ENCOUNTER — Encounter (HOSPITAL_COMMUNITY)
Admission: EM | Disposition: A | Discharge status: Home / Self Care | Payer: Self-pay | Source: Home / Self Care | Attending: Internal Medicine

## 2021-04-18 ENCOUNTER — Inpatient Hospital Stay (HOSPITAL_COMMUNITY): Payer: Medicare Other | Admitting: Anesthesiology

## 2021-04-18 ENCOUNTER — Inpatient Hospital Stay (HOSPITAL_COMMUNITY): Payer: Medicare Other

## 2021-04-18 DIAGNOSIS — K649 Unspecified hemorrhoids: Secondary | ICD-10-CM

## 2021-04-18 DIAGNOSIS — K644 Residual hemorrhoidal skin tags: Secondary | ICD-10-CM

## 2021-04-18 DIAGNOSIS — K922 Gastrointestinal hemorrhage, unspecified: Secondary | ICD-10-CM

## 2021-04-18 HISTORY — PX: FLEXIBLE SIGMOIDOSCOPY: SHX5431

## 2021-04-18 HISTORY — PX: ESOPHAGOGASTRODUODENOSCOPY (EGD) WITH PROPOFOL: SHX5813

## 2021-04-18 LAB — CBC WITH DIFFERENTIAL/PLATELET
Abs Immature Granulocytes: 0.05 10*3/uL (ref 0.00–0.07)
Basophils Absolute: 0 10*3/uL (ref 0.0–0.1)
Basophils Relative: 0 %
Eosinophils Absolute: 0.2 10*3/uL (ref 0.0–0.5)
Eosinophils Relative: 2 %
HCT: 29.2 % — ABNORMAL LOW (ref 36.0–46.0)
Hemoglobin: 9.9 g/dL — ABNORMAL LOW (ref 12.0–15.0)
Immature Granulocytes: 0 %
Lymphocytes Relative: 38 %
Lymphs Abs: 4.9 10*3/uL — ABNORMAL HIGH (ref 0.7–4.0)
MCH: 29.7 pg (ref 26.0–34.0)
MCHC: 33.9 g/dL (ref 30.0–36.0)
MCV: 87.7 fL (ref 80.0–100.0)
Monocytes Absolute: 1.5 10*3/uL — ABNORMAL HIGH (ref 0.1–1.0)
Monocytes Relative: 12 %
Neutro Abs: 6.2 10*3/uL (ref 1.7–7.7)
Neutrophils Relative %: 48 %
Platelets: 233 10*3/uL (ref 150–400)
RBC: 3.33 MIL/uL — ABNORMAL LOW (ref 3.87–5.11)
RDW: 15 % (ref 11.5–15.5)
WBC: 12.9 10*3/uL — ABNORMAL HIGH (ref 4.0–10.5)
nRBC: 0 % (ref 0.0–0.2)

## 2021-04-18 LAB — BASIC METABOLIC PANEL
Anion gap: 12 (ref 5–15)
BUN: 27 mg/dL — ABNORMAL HIGH (ref 8–23)
CO2: 26 mmol/L (ref 22–32)
Calcium: 8.7 mg/dL — ABNORMAL LOW (ref 8.9–10.3)
Chloride: 98 mmol/L (ref 98–111)
Creatinine, Ser: 6.06 mg/dL — ABNORMAL HIGH (ref 0.44–1.00)
GFR, Estimated: 7 mL/min — ABNORMAL LOW (ref >60)
Glucose, Bld: 101 mg/dL — ABNORMAL HIGH (ref 70–99)
Potassium: 3.9 mmol/L (ref 3.5–5.1)
Sodium: 136 mmol/L (ref 135–145)

## 2021-04-18 LAB — HEMOGLOBIN AND HEMATOCRIT, BLOOD
HCT: 30 % — ABNORMAL LOW (ref 36.0–46.0)
HCT: 30 % — ABNORMAL LOW (ref 36.0–46.0)
Hemoglobin: 10.2 g/dL — ABNORMAL LOW (ref 12.0–15.0)
Hemoglobin: 10.5 g/dL — ABNORMAL LOW (ref 12.0–15.0)

## 2021-04-18 LAB — PREPARE RBC (CROSSMATCH)

## 2021-04-18 LAB — GLUCOSE, CAPILLARY
Glucose-Capillary: 100 mg/dL — ABNORMAL HIGH (ref 70–99)
Glucose-Capillary: 108 mg/dL — ABNORMAL HIGH (ref 70–99)
Glucose-Capillary: 114 mg/dL — ABNORMAL HIGH (ref 70–99)
Glucose-Capillary: 116 mg/dL — ABNORMAL HIGH (ref 70–99)
Glucose-Capillary: 171 mg/dL — ABNORMAL HIGH (ref 70–99)
Glucose-Capillary: 89 mg/dL (ref 70–99)
Glucose-Capillary: 95 mg/dL (ref 70–99)

## 2021-04-18 IMAGING — CT CT HEAD W/O CM
3 of 4 series · 14 of 47 positions shown, 16 images · non-contrast
Comparison: CT scan [DATE].  MRI of the head [DATE].

CLINICAL DATA: Follow-up stroke.

EXAM:
CT HEAD WITHOUT CONTRAST
TECHNIQUE: Contiguous axial images were obtained from the base of the skull
through the vertex without intravenous contrast.

[Series 4: head 5.0 mpr ax · axial · 0.33mm/px · z∈[-85,+36]mm · 8 of 31 slices shown, 10 images]
[im 3/31  brain]
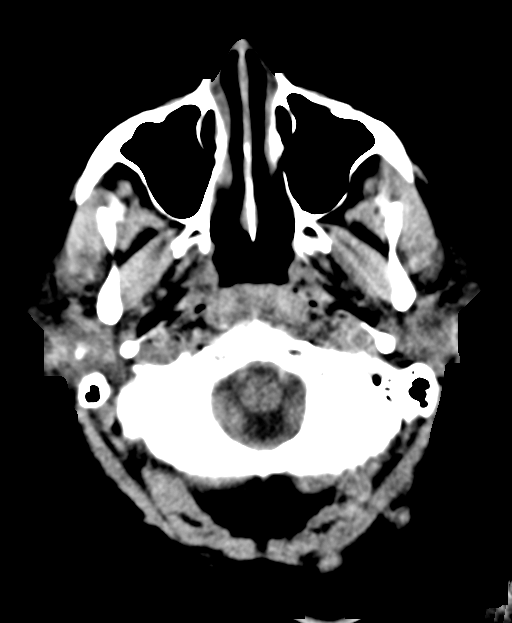
[im 3/31  bone]
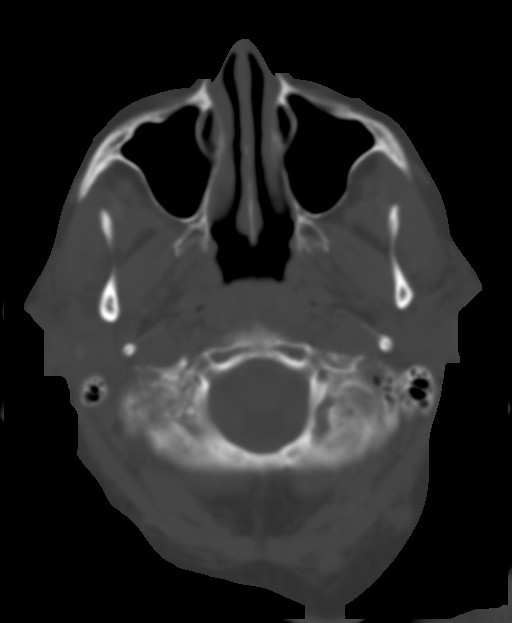
[im 7/31  brain]
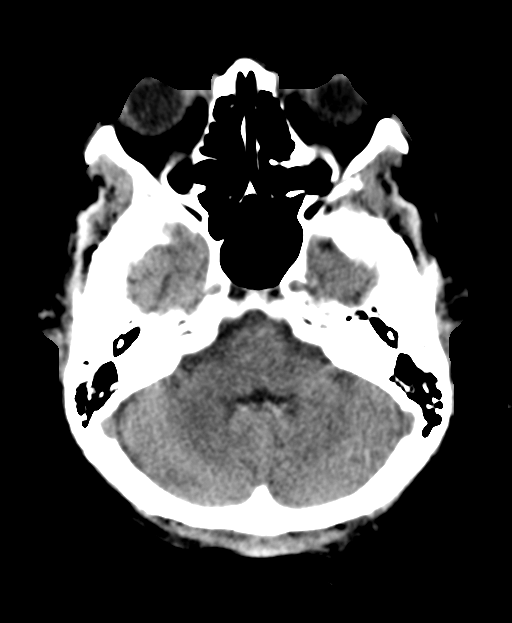
[im 11/31  brain]
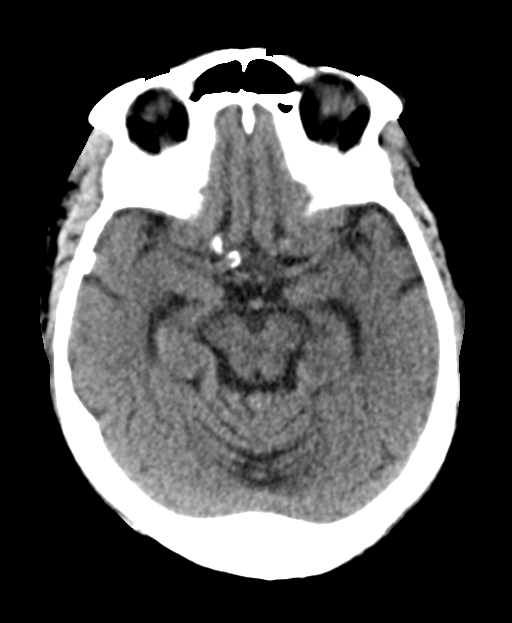
[im 13/31  brain]
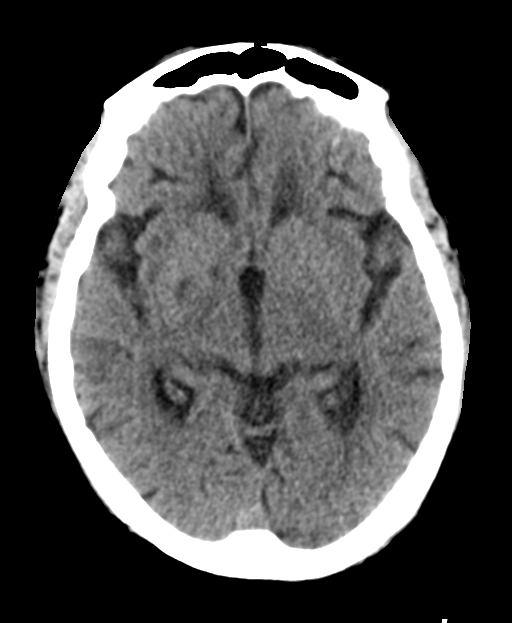
[im 18/31  brain]
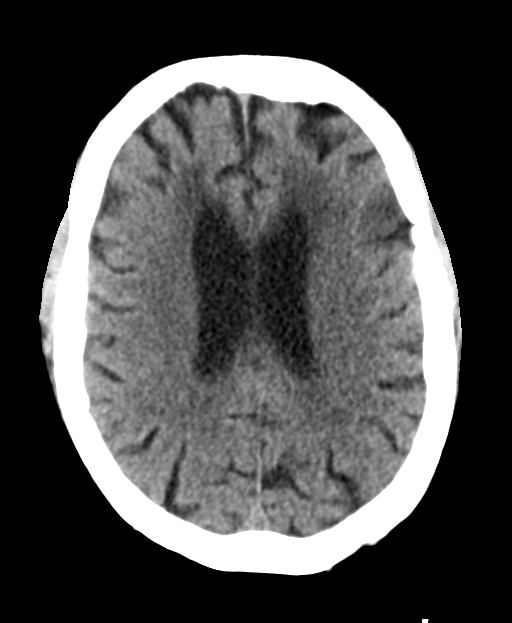
[im 18/31  bone]
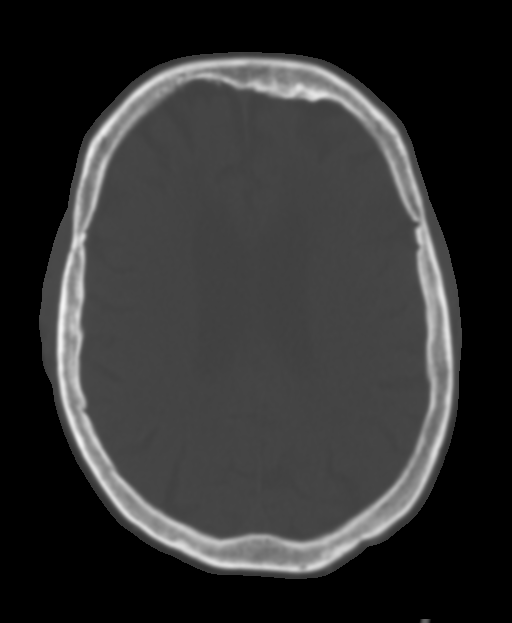
[im 20/31  brain]
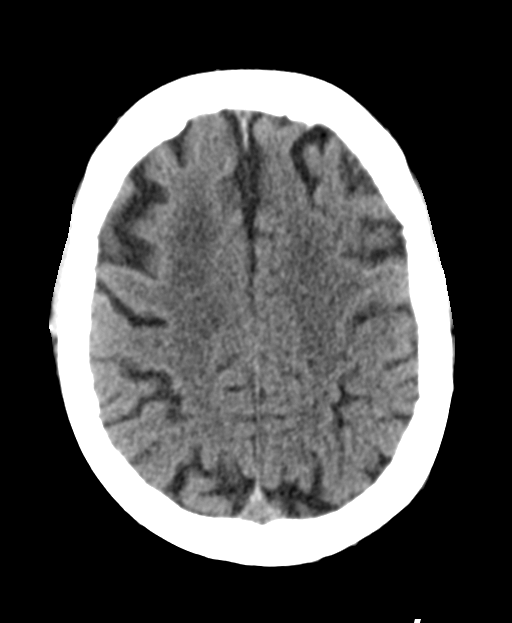
[im 24/31  brain]
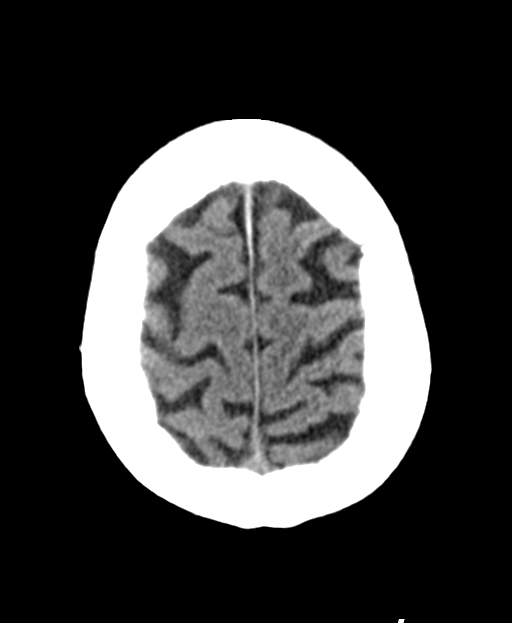
[im 28/31  brain]
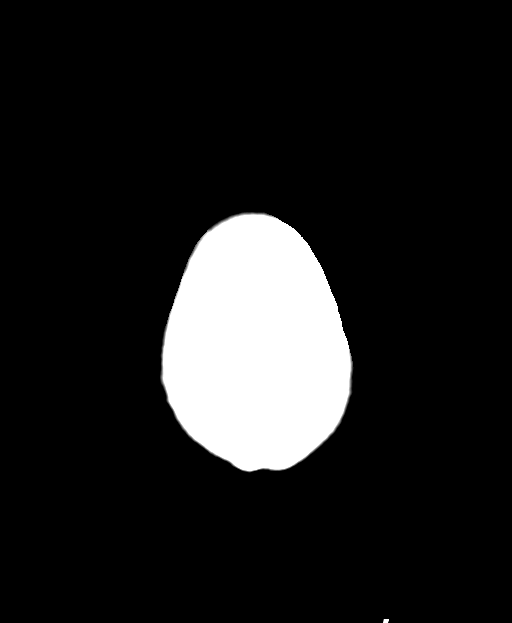

[Series 5: head 3.0 mpr cor · coronal · 0.33mm/px · 3 of 66 slices shown]
[im 22/66  brain]
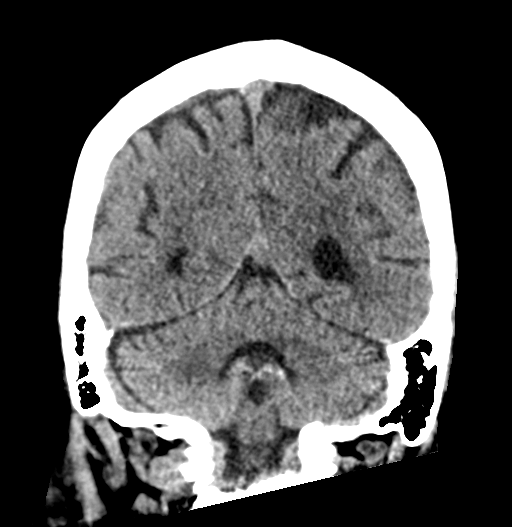
[im 29/66  brain]
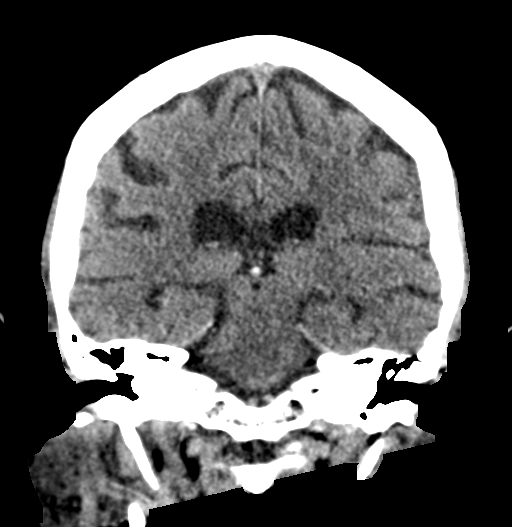
[im 37/66  brain]
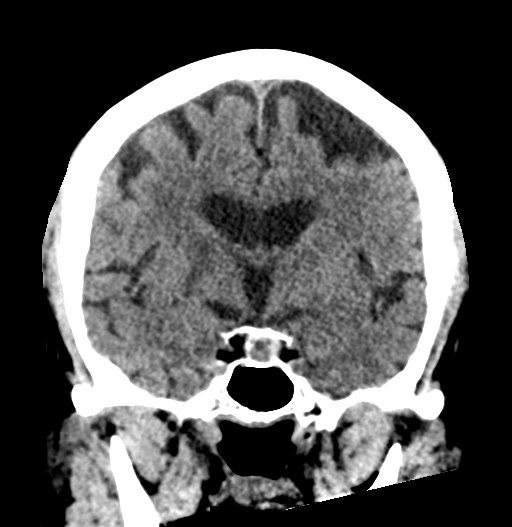

[Series 6: head 3.0 mpr sag · sagittal · 0.33mm/px · 3 of 58 slices shown]
[im 25/58  brain]
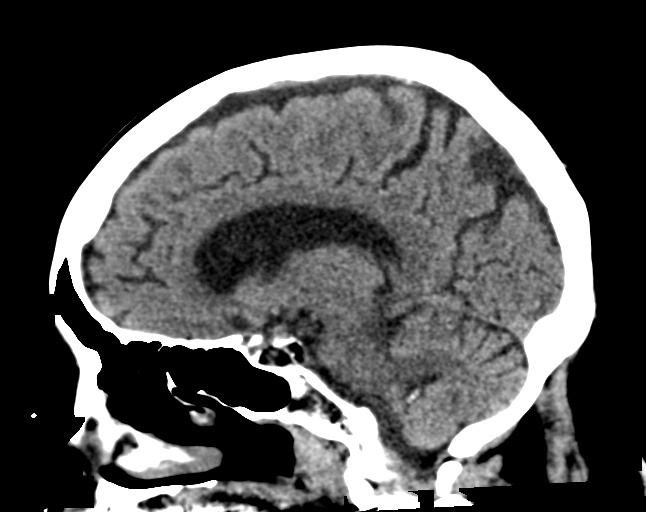
[im 29/58  brain]
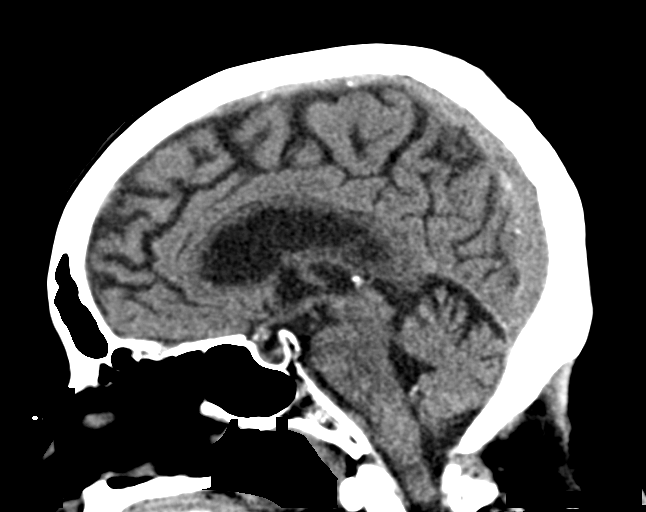
[im 34/58  brain]
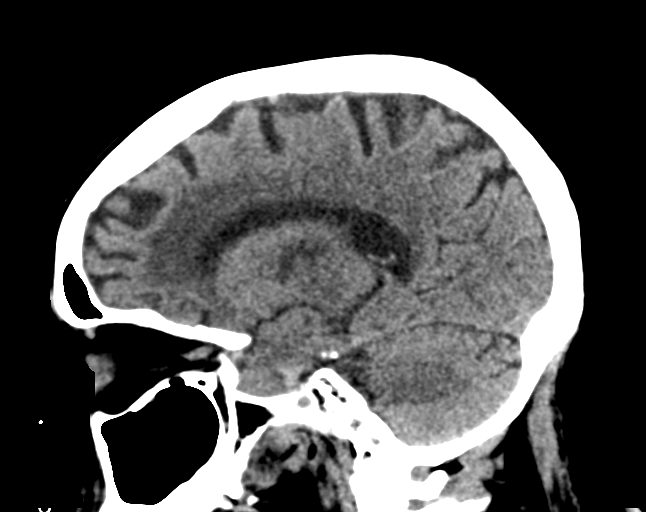

[14 of 47 positions shown; findings below may reference images not displayed]

FINDINGS: Brain: No subdural, epidural, or subarachnoid hemorrhage. No mass
effect or midline shift. Ventricles and sulci are unchanged. The
known right basal ganglia infarct is more pronounced on CT imaging
in the interval seen on series 4, image 17 with decreased
attenuation. No associated hemorrhage. White matter changes
otherwise stable. Cerebellum, brainstem, and basal cisterns are
normal. No other evidence of acute infarct or ischemia.

Vascular: Calcified atherosclerosis in the intracranial carotids.

Skull: Normal. Negative for fracture or focal lesion.

Sinuses/Orbits: No acute finding.

Other: No other abnormalities.
IMPRESSION: 1. The right basal ganglia infarct is better visualized on today's
CT compared to the [DATE] CT due to group decreasing
attenuation. No associated hemorrhage identified. No other
significant interval changes.

## 2021-04-18 SURGERY — SIGMOIDOSCOPY, FLEXIBLE
Anesthesia: Monitor Anesthesia Care

## 2021-04-18 SURGERY — ESOPHAGOGASTRODUODENOSCOPY (EGD) WITH PROPOFOL
Anesthesia: Monitor Anesthesia Care

## 2021-04-18 MED ORDER — SODIUM CHLORIDE 0.9% IV SOLUTION: Freq: Once | INTRAVENOUS | Status: AC

## 2021-04-18 MED ORDER — PROPOFOL 10 MG/ML IV BOLUS
INTRAVENOUS | Status: DC | PRN
  Administered 2021-04-18: 20 mg via INTRAVENOUS

## 2021-04-18 MED ORDER — PROPOFOL 500 MG/50ML IV EMUL
INTRAVENOUS | Status: DC | PRN
  Administered 2021-04-18: 75 ug/kg/min via INTRAVENOUS

## 2021-04-18 MED ORDER — LIDOCAINE 2% (20 MG/ML) 5 ML SYRINGE
INTRAMUSCULAR | Status: DC | PRN
  Administered 2021-04-18: 80 mg via INTRAVENOUS

## 2021-04-18 MED ORDER — PANTOPRAZOLE SODIUM 40 MG IV SOLR
40.0000 mg | Freq: Two times a day (BID) | INTRAVENOUS | Status: DC
  Administered 2021-04-18 – 2021-04-20 (×3): 40 mg via INTRAVENOUS
  Filled 2021-04-18 (×3): qty 40

## 2021-04-18 MED ORDER — SODIUM CHLORIDE 0.9 % IV SOLN
INTRAVENOUS | Status: AC | PRN
  Administered 2021-04-18: 1000 mL via INTRAMUSCULAR

## 2021-04-18 MED ORDER — HYDRALAZINE HCL 20 MG/ML IJ SOLN
5.0000 mg | INTRAMUSCULAR | Status: DC | PRN
  Administered 2021-04-18: 5 mg via INTRAVENOUS
  Filled 2021-04-18: qty 1

## 2021-04-18 SURGICAL SUPPLY — 15 items
BLOCK BITE 60FR ADLT L/F BLUE (MISCELLANEOUS) ×3 IMPLANT
ELECT REM PT RETURN 9FT ADLT (ELECTROSURGICAL) IMPLANT
ELECTRODE REM PT RTRN 9FT ADLT (ELECTROSURGICAL) IMPLANT
FORCEP RJ3 GP 1.8X160 W-NEEDLE (CUTTING FORCEPS) IMPLANT
FORCEPS BIOP RAD 4 LRG CAP 4 (CUTTING FORCEPS) IMPLANT
NDL SCLEROTHERAPY 25GX240 (NEEDLE) IMPLANT
NEEDLE SCLEROTHERAPY 25GX240 (NEEDLE) IMPLANT
PROBE APC STR FIRE (PROBE) IMPLANT
PROBE INJECTION GOLD (MISCELLANEOUS)
PROBE INJECTION GOLD 7FR (MISCELLANEOUS) IMPLANT
SNARE SHORT THROW 13M SML OVAL (MISCELLANEOUS) IMPLANT
SYR 50ML LL SCALE MARK (SYRINGE) IMPLANT
TUBING ENDO SMARTCAP PENTAX (MISCELLANEOUS) ×6 IMPLANT
TUBING IRRIGATION ENDOGATOR (MISCELLANEOUS) ×3 IMPLANT
WATER STERILE IRR 1000ML POUR (IV SOLUTION) IMPLANT

## 2021-04-18 NOTE — Transfer of Care (Signed)
Immediate Anesthesia Transfer of Care Note  Patient: Carolyn Russo  Procedure(s) Performed: ESOPHAGOGASTRODUODENOSCOPY (EGD) WITH PROPOFOL FLEXIBLE SIGMOIDOSCOPY  Patient Location: PACU  Anesthesia Type:MAC  Level of Consciousness: awake and drowsy  Airway & Oxygen Therapy: Patient Spontanous Breathing and Patient connected to nasal cannula oxygen  Post-op Assessment: Report given to RN and Post -op Vital signs reviewed and stable  Post vital signs: Reviewed and stable  Last Vitals:  Vitals Value Taken Time  BP 120/51 04/18/21 1738  Temp 36.9 C 04/18/21 1738  Pulse 81 04/18/21 1744  Resp 16 04/18/21 1744  SpO2 100 % 04/18/21 1744  Vitals shown include unvalidated device data.  Last Pain:  Vitals:   04/18/21 1738  TempSrc: Temporal  PainSc: 0-No pain         Complications: No notable events documented.

## 2021-04-18 NOTE — Anesthesia Postprocedure Evaluation (Signed)
Anesthesia Post Note  Patient: Carolyn Russo  Procedure(s) Performed: ESOPHAGOGASTRODUODENOSCOPY (EGD) WITH PROPOFOL FLEXIBLE SIGMOIDOSCOPY     Patient location during evaluation: PACU Anesthesia Type: MAC Level of consciousness: awake and alert Pain management: pain level controlled Vital Signs Assessment: post-procedure vital signs reviewed and stable Respiratory status: spontaneous breathing, nonlabored ventilation, respiratory function stable and patient connected to nasal cannula oxygen Cardiovascular status: stable and blood pressure returned to baseline Postop Assessment: no apparent nausea or vomiting Anesthetic complications: no   No notable events documented.  Last Vitals:  Vitals:   04/18/21 1757 04/18/21 2054  BP: (!) 157/67 (!) 163/96  Pulse: 81 91  Resp: (!) 22 20  Temp:  37.1 C  SpO2: 100% 98%    Last Pain:  Vitals:   04/18/21 2054  TempSrc: Oral  PainSc:                  Catalina Gravel

## 2021-04-18 NOTE — Evaluation (Signed)
Clinical/Bedside Swallow Evaluation Patient Details  Name: Carolyn Russo MRN: [id number] Date of Birth: 08/24/1951  Today's Date: 04/18/2021 Time: SLP Start Time (ACUTE ONLY): 4166 SLP Stop Time (ACUTE ONLY): 0630 SLP Time Calculation (min) (ACUTE ONLY): 9 min  Past Medical History:  Past Medical History:  Diagnosis Date   Allergy    Anterior chest wall pain    Appendicitis 1965   Asthma    Body mass index 37.0-37.9, adult    Breast pain    Cataract    both eyes   Chronic renal disease, stage II    Dehydration 2014   Deviated septum 1971   Diabetes mellitus    Dyspnea 2014   Extrinsic asthma    WITH ASTHMA ATTACK   Fibroid 1980   GERD (gastroesophageal reflux disease)    Heart murmur    Hx gestational diabetes    Hyperlipidemia    Hypertension 2014   Inguinal hernia 1959   Malaise and fatigue 2014   Nephropathy    Non-IgE mediated allergic asthma 2014   Obesity    Pelvic pain    Pregnancy, high-risk 1985   Tonsillitis 1968   Uterine fibroid 1980   Past Surgical History:  Past Surgical History:  Procedure Laterality Date   [place]     bilateral cataract    HERNIA REPAIR  1959   IR FLUORO GUIDE CV LINE RIGHT  03/18/2021   IR US GUIDE VASC ACCESS RIGHT  03/18/2021   LEFT HEART CATH AND CORONARY ANGIOGRAPHY N/A 04/04/2017   Procedure: Left Heart Cath and Coronary Angiography;  Surgeon: Belva Crome, MD;  Location: [facility];  Service: Cardiovascular;  Laterality: N/A;   MYOMECTOMY  1980, 2004, 2007   RHINOPLASTY  1971   ROTATOR CUFF REPAIR  2003   SURGICAL REPAIR OF HEMORRHAGE  2015   TONSILLECTOMY  1968   HPI:  70 yr old presents with persistent left-sided facial droop slurred speech x 3 days. MRI 2.1 cm acute ischemic nonhemorrhagic right basal ganglia infarct, underlying moderate chronic microvascular ischemic disease. On 6/25 rapis response called due to continued GI bleeding (from  rectum) and made NPO. BSE 03/22/21 for SOB and found to have acute on chronic CHF d/t choking on collards/vinegar. Swallow was grossly WFL's- re/thin recommneded. PMH: asthma, chronic renal disease, DM, GERD, HTN.   Assessment / Plan / Recommendation Clinical Impression  She was unble to remain adequately awake throughout eval with frequent mod-max stimuli. Nodding head to questions, did not follow commands. Pudding held in anterior sulci with dry spoon presentations to repositing pudding to dorsal lingual surface and lingual depression for increase sensory input. Eventual transit to initiate a weak appearing pharyngeal swallow. Removed mild lingual residue. Continue NPO and RN to notify SLP if she becomes alert this afternoon. SLP Visit Diagnosis: Dysphagia, unspecified (R13.10)    Aspiration Risk  Moderate aspiration risk    Diet Recommendation NPO        Other  Recommendations Oral Care Recommendations: Oral care QID   Follow up Recommendations Other (comment) (TBD)      Frequency and Duration min 2x/week  2 weeks       Prognosis Prognosis for Safe Diet Advancement: Good Barriers to Reach Goals:  (lethargy)      Swallow Study   General Date of Onset: 04/16/21 HPI: 70 yr old presents with persistent left-sided facial droop slurred  speech x 3 days. MRI 2.1 cm acute ischemic nonhemorrhagic right basal ganglia infarct, underlying moderate chronic microvascular ischemic disease. On 6/25 rapis response called due to continued GI bleeding (from rectum) and made NPO. BSE 03/22/21 for SOB and found to have acute on chronic CHF d/t choking on collards/vinegar. Swallow was grossly WFL's- re/thin recommneded. PMH: asthma, chronic renal disease, DM, GERD, HTN. Type of Study: Bedside Swallow Evaluation Previous Swallow Assessment:  (see HPI) Diet Prior to this Study: NPO Temperature Spikes Noted: No Respiratory Status: Room air History of Recent Intubation: No Behavior/Cognition:  Lethargic/Drowsy Oral Cavity Assessment:  (unable to fully view) Oral Care Completed by SLP: Yes Oral Cavity - Dentition: Other (Comment) (may be missing several?) Vision:  (will determine) Self-Feeding Abilities: Total assist Patient Positioning: Upright in bed Baseline Vocal Quality:  (no vocalizations) Volitional Cough: Other (Comment) (too letharguc) Volitional Swallow: Unable to elicit    Oral/Motor/Sensory Function Overall Oral Motor/Sensory Function:  (will assess)   Ice Chips Ice chips: Not tested   Thin Liquid Thin Liquid: Not tested    Nectar Thick Nectar Thick Liquid: Not tested   Honey Thick Honey Thick Liquid: Not tested   Puree Puree: Impaired Presentation: Spoon Oral Phase Impairments: Poor awareness of bolus;Reduced lingual movement/coordination Oral Phase Functional Implications: Oral holding Pharyngeal Phase Impairments: Suspected delayed Swallow   Solid     Solid: Not tested      Houston Siren 04/18/2021,10:02 AM  Orbie Pyo Colvin Caroli.Ed Risk analyst [phone] Office [phone]

## 2021-04-18 NOTE — Consult Note (Addendum)
Consultation  Referring Provider: TR[facility]/ Elgergawy Primary Care Physician:  [name], MD Primary Gastroenterologist:  Dr.Danis  Reason for Consultation:  GI bleed  HPI: Carolyn Russo is a 70 y.o. female, who was admitted on 04/16/2021 when she presented with left-sided weakness.  She has history of hypertension, end-stage renal disease on hemodialysis, insulin-dependent diabetes mellitus, coronary artery disease, chronic anemia and asthma.  She had very recent hospitalization 5/17 through 03/24/2021 with congestive heart failure exacerbation and also initiated dialysis. She had presented to the ER a couple of days prior to admission with complaints of left-sided weakness and had CT of the brain which was negative, however left without being formally evaluated. MRI on 04/16/2021 shows a right subcortical CVA.  She has been evaluated by neurology and was loaded with Plavix 300 mg x 1, and started aspirin 325 daily. Per nursing, was noted to pass a large bloody bowel movement s with blood and clots, starting around 1 PM yesterday, she had multiple episodes yesterday but the last one noted around 10 PM. No prior history of GI bleeding  Her baseline hemoglobin is between 8 and 10.5. Yesterday hemoglobin was 8.9, down to 7.2 late last evening, she has been transfused and hemoglobin up to 9.9 today   Noted by hospitalist to be more lethargic today and repeat CT of the head ordered-right basal ganglia infarct stable, no associated hemorrhage identified, no other interval change  Patient did have prior colonoscopy in June 2019 per Dr. Loletha Carrow. for screening she had 3 sessile polyps removed which were tubular adenomas, no diverticulosis noted. No prior EGD.  Patient remains lethargic this afternoon but is able to nod appropriately and answer verbally with 1 or 2 words.  When I was leaving she said to have a good day.  She denied any abdominal pain or discomfort or cramping, no nausea or  vomiting. Patient was evaluated by speech path today and not felt safe as yet to resume oral feedings.   Past Medical History:  Diagnosis Date   Allergy    Anterior chest wall pain    Appendicitis 1965   Asthma    Body mass index 37.0-37.9, adult    Breast pain    Cataract    both eyes   Chronic renal disease, stage II    Dehydration 2014   Deviated septum 1971   Diabetes mellitus    Dyspnea 2014   Extrinsic asthma    WITH ASTHMA ATTACK   Fibroid 1980   GERD (gastroesophageal reflux disease)    Heart murmur    Hx gestational diabetes    Hyperlipidemia    Hypertension 2014   Inguinal hernia 1959   Malaise and fatigue 2014   Nephropathy    Non-IgE mediated allergic asthma 2014   Obesity    Pelvic pain    Pregnancy, high-risk 1985   Tonsillitis 1968   Uterine fibroid 1980    Past Surgical History:  Procedure Laterality Date   [place]     bilateral cataract    HERNIA REPAIR  1959   IR FLUORO GUIDE CV LINE RIGHT  03/18/2021   IR US GUIDE VASC ACCESS RIGHT  03/18/2021   LEFT HEART CATH AND CORONARY ANGIOGRAPHY N/A 04/04/2017   Procedure: Left Heart Cath and Coronary Angiography;  Surgeon: Belva Crome, MD;  Location: [facility];  Service: Cardiovascular;  Laterality: N/A;  MYOMECTOMY  1980, 2004, 2007   RHINOPLASTY  1971   ROTATOR CUFF REPAIR  2003   SURGICAL REPAIR OF HEMORRHAGE  2015   TONSILLECTOMY  1968    Prior to Admission medications   Medication Sig Start Date End Date Taking? Authorizing Provider  albuterol (PROVENTIL) (2.5 MG/3ML) 0.083% nebulizer solution Take 3 mLs (2.5 mg total) by nebulization every 4 (four) hours as needed for wheezing or shortness of breath. 03/04/21 03/04/22 Yes Ghumman, Ramandeep, NP  albuterol (VENTOLIN HFA) 108 (90 Base) MCG/ACT inhaler Inhale 2 puffs into the lungs every 4 (four) hours as needed for wheezing or shortness of breath. 03/01/21  Yes [name], MD  aspirin 81 MG tablet Take 81 mg by mouth daily.   Yes [provider]  b complex vitamins capsule Take 1 capsule by mouth daily.   Yes [provider]  BESIVANCE 0.6 % SUSP Place 1 drop into both eyes See admin instructions. After  eye injections 10/26/18  Yes [provider]  Caraway Oil-Levomenthol (FDGARD PO) Take 1 tablet by mouth 2 (two) times daily as needed (nausea).   Yes [provider]  carvedilol (COREG) 3.125 MG tablet Take 1 tablet (3.125 mg total) by mouth 2 (two) times daily with a meal. 03/24/21  Yes Nita Sells, MD  Cholecalciferol (VITAMIN D3 PO) Take 2,000 Units by mouth daily.   Yes [provider]  Continuous Blood Gluc Receiver (DEXCOM G6 RECEIVER) DEVI Use as directed to check blood sugars 3 times per day dx: e11.65 Patient taking differently: 1 each by Other route See admin instructions. Use as directed to check blood sugars 3 times per day dx: e11.65 04/14/20  Yes [name], MD  Continuous Blood Gluc Sensor (DEXCOM G6 SENSOR) MISC Use as directed to check blood sugars 3 times per day dx: e11.65 Patient taking differently: 1 each by Other route See admin instructions. Use as directed to check blood sugars 3 times per day dx: e11.65 04/14/20  Yes [name], MD  Continuous Blood Gluc Transmit (DEXCOM G6 TRANSMITTER) MISC Use as directed to check blood sugars 3 times per day dx: e11.65 Patient taking differently: 1 each by Other route See admin instructions. Use as directed to check blood sugars 3 times per day dx: e11.65 04/14/20  Yes [name], MD  docusate sodium (COLACE) 100 MG capsule Take 100 mg by mouth daily as needed for mild constipation.   Yes [provider]  fluticasone (FLONASE) 50 MCG/ACT nasal spray Place 2 sprays into the nose daily as needed for allergies.    Yes [provider]  furosemide (LASIX) 40 MG tablet Take 1-2 tablets (40-80 mg total) by mouth every Monday,  Wednesday, and Friday. 80 mg twice a week. 40 mg all other days 03/24/21  Yes Nita Sells, MD  hydrocortisone (ANUSOL-HC) 2.5 % rectal cream Place 1 application rectally 2 (two) times daily. 04/12/21  Yes [name], MD  insulin aspart protamine - aspart (NOVOLOG MIX 70/30 FLEXPEN) (70-30) 100 UNIT/ML FlexPen Inject 1 mL (100 Units total) into the skin 2 (two) times daily with a meal. 20 units in the morning and 15 units at night Patient taking differently: Inject 15-20 Units into the skin 2 (two) times daily with a meal. 20 units in the morning and 15 units at night 03/23/21  Yes Nita Sells, MD  magnesium oxide (MAG-OX) 400 (241.3 Mg) MG tablet Take 400 mg by mouth daily.   Yes [provider]  ondansetron (ZOFRAN) 4  MG tablet Take 1 tablet (4 mg total) by mouth every 6 (six) hours as needed for nausea or vomiting. 03/29/21 03/29/22 Yes [name], MD  OneTouch Delica Lancets 68T MISC Use as directed to check blood sugars before breakfast and dinner dx: e11.65 Patient taking differently: 1 each by Other route See admin instructions. Use as directed to check blood sugars before breakfast and dinner dx: e11.65 04/22/20  Yes [name], MD  rosuvastatin (CRESTOR) 20 MG tablet Take 1 tablet (20 mg total) by mouth daily. 11/19/20  Yes Belva Crome, MD  sevelamer carbonate (RENVELA) 800 MG tablet Take 1 tablet (800 mg total) by mouth 3 (three) times daily with meals. 03/24/21  Yes Nita Sells, MD  SYMBICORT 160-4.5 MCG/ACT inhaler Inhale 2 puffs into the lungs 2 (two) times daily. 02/26/21  Yes [name], MD  traMADol (ULTRAM) 50 MG tablet Take 1 tablet (50 mg total) by mouth every 12 (twelve) hours as needed for severe pain. 03/24/21  Yes Charlynne Cousins, MD  vitamin C (ASCORBIC ACID) 500 MG tablet Take 500 mg by mouth daily.   Yes [provider]  mometasone-formoterol (DULERA) 200-5 MCG/ACT AERO Inhale 2 puffs into the lungs 2 (two) times daily. 02/25/21  04/16/21 Yes [name], MD  iron polysaccharides (NIFEREX) 150 MG capsule Take 1 capsule (150 mg total) by mouth daily. Patient not taking: No sig reported 03/24/21 04/16/21  Nita Sells, MD    Current Facility-Administered Medications  Medication Dose Route Frequency Provider Last Rate Last Admin   acetaminophen (TYLENOL) tablet 650 mg  650 mg Oral Q4H PRN Fuller Plan A, MD   650 mg at 04/16/21 2001   Or   acetaminophen (TYLENOL) 160 MG/5ML solution 650 mg  650 mg Per Tube Q4H PRN Norval Morton, MD       Or   acetaminophen (TYLENOL) suppository 650 mg  650 mg Rectal Q4H PRN Fuller Plan A, MD       albuterol (PROVENTIL) (2.5 MG/3ML) 0.083% nebulizer solution 2.5 mg  2.5 mg Nebulization Q4H PRN Fuller Plan A, MD   2.5 mg at 04/18/21 1572   ALPRAZolam Duanne Moron) tablet 0.5 mg  0.5 mg Oral TID PRN Tawni Millers, MD   0.5 mg at 04/17/21 1831   arformoterol (BROVANA) nebulizer solution 15 mcg  15 mcg Nebulization BID Fuller Plan A, MD   15 mcg at 04/18/21 6203   budesonide (PULMICORT) nebulizer solution 0.5 mg  0.5 mg Nebulization BID Fuller Plan A, MD   0.5 mg at 04/18/21 5597   carvedilol (COREG) tablet 3.125 mg  3.125 mg Oral BID WC Smith, Rondell A, MD   3.125 mg at 04/17/21 4163   Chlorhexidine Gluconate Cloth 2 % PADS 6 each  6 each Topical Daily Fuller Plan A, MD   6 each at 04/18/21 1140   docusate sodium (COLACE) capsule 100 mg  100 mg Oral Daily PRN Fuller Plan A, MD       fluticasone (FLONASE) 50 MCG/ACT nasal spray 2 spray  2 spray Each Nare Daily PRN Smith, Rondell A, MD       furosemide (LASIX) tablet 40-80 mg  40-80 mg Oral Q M,W,F Smith, Rondell A, MD       hydrALAZINE (APRESOLINE) injection 5 mg  5 mg Intravenous Q4H PRN Elgergawy, Silver Huguenin, MD       hydrocortisone (ANUSOL-HC) 2.5 % rectal cream 1 application  1 application Rectal BID PRN Norval Morton, MD  hydrocortisone (ANUSOL-HC) suppository 25 mg  25 mg Rectal BID Fuller Plan  A, MD   25 mg at 04/16/21 2142   ondansetron (ZOFRAN) 8 mg in sodium chloride 0.9 % 50 mL IVPB  8 mg Intravenous Q6H PRN Metzger-Cihelka, Desiree, NP       rosuvastatin (CRESTOR) tablet 20 mg  20 mg Oral Daily Smith, Rondell A, MD   20 mg at 04/17/21 0913   senna-docusate (Senokot-S) tablet 1 tablet  1 tablet Oral QHS PRN Fuller Plan A, MD       sevelamer carbonate (RENVELA) tablet 800 mg  800 mg Oral TID WC Smith, Rondell A, MD   800 mg at 04/17/21 0912   traMADol (ULTRAM) tablet 50 mg  50 mg Oral Q12H PRN Fuller Plan A, MD   50 mg at 04/16/21 1555    Allergies as of 04/16/2021 - Review Complete 04/16/2021  Allergen Reaction Noted   Food Anaphylaxis 12/13/2011   Other Shortness Of Breath 12/13/2011   Wheat bran Shortness Of Breath    Statins Itching and Other (See Comments) 11/25/2011   Iodine Other (See Comments) 06/27/2018   Shellfish allergy Other (See Comments) 02/08/2013   Sitagliptin  12/07/2020   Tetracycline Other (See Comments) 07/16/2018   Tetracyclines & related Itching 11/21/2011   Contrast media [iodinated diagnostic agents] Rash 04/04/2017    Family History  Problem Relation Age of Onset   Cancer Mother        abdominal melamona   Psoriasis Mother    Alzheimer's disease Father    Cancer Cousin        colon    Diabetes Cousin    Diabetes Maternal Aunt    Colon cancer Neg Hx    Colon polyps Neg Hx    Esophageal cancer Neg Hx    Rectal cancer Neg Hx    Stomach cancer Neg Hx    Breast cancer Neg Hx     Social History   Socioeconomic History   Marital status: Married    Spouse name: Not on file   Number of children: 1   Years of education: Not on file   Highest education level: Professional school degree (e.g., MD, DDS, DVM, JD)  Occupational History   Occupation: retired  Tobacco Use   Smoking status: Never   Smokeless tobacco: Never  Vaping Use   Vaping Use: Never used  Substance and Sexual Activity   Alcohol use: No   Drug use: No   Sexual  activity: Yes    Birth control/protection: Post-menopausal  Other Topics Concern   Not on file  Social History Narrative   Patient reports she used to walk at [employer] but now due to Covid-19 she is staying inside.   Social Determinants of Health   Financial Resource Strain: Low Risk    Difficulty of Paying Living Expenses: Not hard at all  Food Insecurity: No Food Insecurity   Worried About Charity fundraiser in the Last Year: Never true   [place] in the Last Year: Never true  Transportation Needs: No Transportation Needs   Lack of Transportation (Medical): No   Lack of Transportation (Non-Medical): No  Physical Activity: Sufficiently Active   Days of Exercise per Week: 4 days   Minutes of Exercise per Session: 60 min  Stress: Stress Concern Present   Feeling of Stress : To some extent  Social Connections: Not on file  Intimate Partner Violence: Not on file    Review of Systems:  Pertinent positive and negative review of systems were noted in the above HPI section.  All other review of systems was otherwise negative.   Physical Exam: Vital signs in last 24 hours: Temp:  [97.7 F (36.5 C)-98.7 F (37.1 C)] 98.5 F (36.9 C) (06/26 1100) Pulse Rate:  [74-90] 86 (06/26 1100) Resp:  [12-30] 14 (06/26 1205) BP: (95-177)/(36-76) 151/76 (06/26 1100) SpO2:  [94 %-100 %] 99 % (06/26 1100) Last BM Date: 04/17/21 General:   Alert,  Well-developed, well-nourished, older African-American female pleasant and cooperative in NAD-able to nod appropriately, cooperate with exam and say a few words Head:  Normocephalic and atraumatic. Eyes:  Sclera clear, no icterus.   Conjunctiva pink. Ears:  Normal auditory acuity. Nose:  No deformity, discharge,  or lesions. Mouth:  No deformity or lesions.   Neck:  Supple; no masses or thyromegaly. Lungs:  Clear throughout to auscultation.   No wheezes, crackles, or rhonchi.  Heart:  Regular rate and rhythm; no murmurs, clicks, rubs,  or  gallops. Abdomen:  Soft,nontender, BS active,nonpalp mass or hsm.   Rectal: Not done Msk:  Symmetrical without gross deformities. . Pulses:  Normal pulses noted. Extremities:  Without clubbing or edema. Neurologic:  Alert some difficulty with slow mentation but able to speak, left upper and lower extremity weakness, left facial droop Skin:  Intact without significant lesions or rashes.. Psych:  Alert and cooperative.  Intake/Output from previous day: 06/25 0701 - 06/26 0700 In: 315 [Blood:315] Out: -  Intake/Output this shift: Total I/O In: 437.5 [Blood:437.5] Out: -   Lab Results: Recent Labs    04/16/21 1215 04/17/21 0224 04/17/21 1504 04/17/21 2040 04/18/21 1006  WBC 9.0 9.9  --   --  12.9*  HGB 9.0* 8.9* 8.3* 7.2* 9.9*  HCT 27.3* 26.1* 24.8* 21.5* 29.2*  PLT 282 267  --   --  233   BMET Recent Labs    04/16/21 1215 04/17/21 0224 04/18/21 1006  NA 136 136 136  K 4.0 3.6 3.9  CL 96* 97* 98  CO2 34* 29 26  GLUCOSE 192* 90 101*  BUN 12 16 27*  CREATININE 3.02* 4.08* 6.06*  CALCIUM 8.2* 8.8* 8.7*   LFT Recent Labs    04/17/21 0224  ALBUMIN 3.1*   PT/INR No results for input(s): LABPROT, INR in the last 72 hours. Hepatitis Panel No results for input(s): HEPBSAG, HCVAB, HEPAIGM, HEPBIGM in the last 72 hours.    IMPRESSION:   #9 70 year old African-American female with end-stage renal disease on hemodialysis admitted 04/16/2021 with acute right subcortical CVA Started on Plavix load 300 mg and aspirin  Patient noted more lethargic today, CT of the head does not show any associated bleed.  Speech path eval today, not cleared for oral intake   #2 acute GI bleed, probably lower in setting of Plavix load and aspirin, with multiple episodes of bloody stool yesterday, no further bleeding thus far today Etiology not clear, prior colonoscopy 2019 did not reveal any diverticulosis, she did have adenomatous polyps removed. Consider diverticular, consider AVMs,  doubt ischemia as no pain, rule out small bowel or possible upper GI source/PUD/gastropathy #3, acute on chronic anemia secondary to GI bleed, transfused 2 units  #4 and stage renal disease on hemodialysis #5 adult onset diabetes mellitus #6 history of hypertension #7 diastolic heart failure    PLAN: Continue n.p.o. as has not been cleared by speech path for oral intake Plavix on hold Cover with twice daily PPI Continue serial hemoglobins  and transfuse as indicated. Nuc med bleeding scan for further active bleeding Patient would be unable to complete a bowel prep currently, would require NG placement etc. for prep.  We will see how she does today regarding any further bleeding, then decide regarding EGD versus EGD/colonoscopy   Addendum-3:45 PM-patient just had another large bloody stool Discussed with Dr. Carlean Purl, we will proceed with EGD /and possible flexible sigmoidoscopy this afternoon to rule out upper GI bleed. I spoke with patient's husband, who is at bedside, updated and explained upper endoscopy, indications risk and benefits and he is agreeable to proceed.     Amy Esterwood PA-C 04/18/2021, 12:21 PM     [facility] Attending   I have taken an interval history, reviewed the chart and examined the patient. I agree with the Advanced Practitioner's note, impression and recommendations.   Gatha Mayer, MD, [facility] 04/18/2021 5:11 PM

## 2021-04-18 NOTE — Progress Notes (Signed)
Notified Dr Olevia Bowens of b/p outside of parameters @ 188/77. Hydralazine 5 mg IVP given and B/P will be rechecked in 30 minutes.

## 2021-04-18 NOTE — Anesthesia Procedure Notes (Signed)
Procedure Name: MAC Date/Time: 04/18/2021 5:10 PM Performed by: Trinna Post., CRNA Pre-anesthesia Checklist: Patient identified, Emergency Drugs available, Suction available, Patient being monitored and Timeout performed Patient Re-evaluated:Patient Re-evaluated prior to induction Oxygen Delivery Method: Nasal cannula Preoxygenation: Pre-oxygenation with 100% oxygen Induction Type: IV induction Placement Confirmation: positive ETCO2

## 2021-04-18 NOTE — Progress Notes (Addendum)
PROGRESS NOTE    Carolyn Russo  DUK:[id number] DOB: 10/13/1951 DOA: 04/16/2021 PCP: [name], MD    Brief Narrative:   Mrs. Outen was admitted with the working diagnosis of right subcortical infarct.   70 year old female past medical history for dyslipidemia, type 2 diabetes mellitus, coronary artery disease, iron deficiency anemia, asthma, and end-stage renal disease on hemodialysis who presented with left-sided weakness.  Recent hospitalization 03/09/2021 to 12/28/6281 for diastolic heart failure, volume overload and progressive renal disease, she was started on hemodialysis.   She reported 3 days of left face, shoulder and leg weakness.  Initially she came to the ED, work-up started with head CT but she left before being seen.  At home she continued to have worsening symptoms, now associated with slurred speech and lethargy.  Her husband brought her back to the hospital.  On her initial physical examination blood pressure 148/83, heart rate 89, respiratory rate 17, oxygen saturation 98%, her lungs are clear to auscultation, heart S1-S2, present, rhythmic, soft abdomen, no lower extremity edema, patient had decreased strength on the left side, 4/5.  Left facial droop.  Sodium 136, potassium 4.0, chloride 96, bicarb 34, glucose 92, BUN 12, creatinine 3.0, white count 9.0, hemoglobin 9.0, hematocrit 27.3, platelets 282. SARS COVID-19 negative.  6/22 head CT negative for acute changes.  06/25 patient with abrupt rectal bleeding, large amounts with clots.  Painless.  Continues overnight with hemoglobin dropped to 7.2, where she was transfused 2 units PRBC.  Assessment & Plan:   Principal Problem:   CVA (cerebral vascular accident) ([place]) Active Problems:   Type 2 diabetes mellitus (HCC)   Dyslipidemia (high LDL; low HDL)   Obesity (BMI 30-39.9)   Chronic diastolic CHF (congestive heart failure) (HCC)   Hypertensive urgency   Anemia of chronic disease   ESRD (end stage renal  disease) (HCC)   Acute subcortical right hemisphere CVA. Her speech has improved but continue to have left facial droop and -MRI brain significant for right subcortical infarct, MRA with no LVO or high-grade stenosis, he had recent echo done and may 19th, with a EF 50%, with LA mild dilation  Initial aspirin and Plavix which has been held due to lower GI bleed.   She remains altered, n.p.o., will need SLP evaluation once appropriate  - Follow up with PT, OT , speech and neurology recommendations.   2. . Acute lower GI bleed, large clots.  - Patient had clopidogrel load 300 mg once and she is on aspirin as well -She continued to have large blood clots during the day yesterday, hemoglobin dropped to 7.2, so she was transfused 2 unit PRBC, with good response, follow-up hemoglobin is 9.9. -To hold aspirin and Plavix for now -GI consulted  3. ESRD on HD.  -Consulted, to continue her hemodialysis on Monday Wednesday Friday schedule. Continue with sevalamer for metabolic bone disease.   4. T2DM/ dyslipidemia. Continue close glucose cover and monitoring. Hold on insulin 70/30 and add insulin sliding scale.    Continue with statin therapy.   5. HTN uncontrolled urgency/ diastolic heart failure.  -Hold for permissive hypertension, will keep on as needed hydralazine for systolic blood pressure> 151 Status is: Inpatient  Remains inpatient appropriate because:Inpatient level of care appropriate due to severity of illness  Dispo: The patient is from: Home              Anticipated d/c is to: Home  Patient currently is not medically stable to d/c.   Difficult to place patient No   DVT prophylaxis: Scd   Code Status:   full  Family Communication:  I spoke with patient's husband at the bedside, we talked in detail about patient's condition, plan of care and prognosis and all questions were addressed.     Consultants:  Neurology GI    Subjective:  Patient continued to have  lower GI bleed with large blood clots yesterday evening, for which he received units PRBC, but no further bleeding since last night.   Objective: Vitals:   04/18/21 0431 04/18/21 0452 04/18/21 0739 04/18/21 0822  BP: (!) 95/36 (!) 123/47 (!) 177/67   Pulse: 82 81 90   Resp: 12 15 16    Temp: 98.2 F (36.8 C) 98 F (36.7 C) 98.6 F (37 C)   TempSrc: Oral Oral Oral   SpO2: 96% 97% 99% 99%  Weight:      Height:        Intake/Output Summary (Last 24 hours) at 04/18/2021 1200 Last data filed at 04/18/2021 0730 Gross per 24 hour  Intake 752.5 ml  Output --  Net 752.5 ml   Filed Weights   04/16/21 1154  Weight: 83.9 kg    Examination:   Lethargic,  difficult to arouse initially, but upon my reevaluation in the afternoon, she was awake alert and more appropriate. Symmetrical Chest wall movement, Good air movement bilaterally, CTAB RRR,No Gallops,Rubs or new Murmurs, No Parasternal Heave +ve B.Sounds, Abd Soft, No tenderness, No rebound - guarding or rigidity. No Cyanosis, Clubbing or edema, No new Rash or bruise       Data Reviewed: I have personally reviewed following labs and imaging studies  CBC: Recent Labs  Lab 04/14/21 1824 04/14/21 1833 04/16/21 1215 04/17/21 0224 04/17/21 1504 04/17/21 2040 04/18/21 1006  WBC 9.9  --  9.0 9.9  --   --  12.9*  NEUTROABS 5.4  --  4.2 3.6  --   --  6.2  HGB 8.8*   < > 9.0* 8.9* 8.3* 7.2* 9.9*  HCT 26.7*   < > 27.3* 26.1* 24.8* 21.5* 29.2*  MCV 87.5  --  88.1 85.9  --   --  87.7  PLT 265  --  282 267  --   --  233   < > = values in this interval not displayed.   Basic Metabolic Panel: Recent Labs  Lab 04/14/21 1824 04/14/21 1833 04/16/21 1215 04/17/21 0224 04/18/21 1006  NA 135 135 136 136 136  K 4.0 4.0 4.0 3.6 3.9  CL 94* 93* 96* 97* 98  CO2 32  --  34* 29 26  GLUCOSE 143* 139* 192* 90 101*  BUN 14 16 12 16  27*  CREATININE 3.58* 3.60* 3.02* 4.08* 6.06*  CALCIUM 8.5*  --  8.2* 8.8* 8.7*  PHOS  --   --   --  3.7   --    GFR: Estimated Creatinine Clearance: 8.4 mL/min (A) (by C-G formula based on SCr of 6.06 mg/dL (H)). Liver Function Tests: Recent Labs  Lab 04/14/21 1824 04/17/21 0224  AST 27  --   ALT 21  --   ALKPHOS 59  --   BILITOT 0.7  --   PROT 6.9  --   ALBUMIN 3.4* 3.1*   No results for input(s): LIPASE, AMYLASE in the last 168 hours. No results for input(s): AMMONIA in the last 168 hours. Coagulation Profile: Recent Labs  Lab 04/14/21  1824  INR 1.0   Cardiac Enzymes: No results for input(s): CKTOTAL, CKMB, CKMBINDEX, TROPONINI in the last 168 hours. BNP (last 3 results) No results for input(s): PROBNP in the last 8760 hours. HbA1C: Recent Labs    04/16/21 1840  HGBA1C 5.2   CBG: Recent Labs  Lab 04/17/21 1958 04/18/21 0008 04/18/21 0421 04/18/21 0920 04/18/21 1143  GLUCAP 207* 171* 116* 95 100*   Lipid Profile: Recent Labs    04/16/21 1840  CHOL 135  HDL 47  LDLCALC 70  TRIG 89  CHOLHDL 2.9   Thyroid Function Tests: No results for input(s): TSH, T4TOTAL, FREET4, T3FREE, THYROIDAB in the last 72 hours. Anemia Panel: No results for input(s): VITAMINB12, FOLATE, FERRITIN, TIBC, IRON, RETICCTPCT in the last 72 hours.    Radiology Studies: I have reviewed all of the imaging during this hospital visit personally     Scheduled Meds:  arformoterol  15 mcg Nebulization BID   budesonide (PULMICORT) nebulizer solution  0.5 mg Nebulization BID   carvedilol  3.125 mg Oral BID WC   Chlorhexidine Gluconate Cloth  6 each Topical Daily   furosemide  40-80 mg Oral Q M,W,F   hydrocortisone  25 mg Rectal BID   rosuvastatin  20 mg Oral Daily   sevelamer carbonate  800 mg Oral TID WC   Continuous Infusions:  ondansetron (ZOFRAN) IV       LOS: 2 days        Phillips Climes, MD

## 2021-04-18 NOTE — Progress Notes (Signed)
OT Cancellation Note  Patient Details Name: Carolyn Russo MRN: [id number] DOB: 04-02-1951   Cancelled Treatment:    Reason Eval/Treat Not Completed: Fatigue/lethargy limiting ability to participate. Pt sleeping and difficult to arouse. States she did not sleep last night. Will return later time.   Ramond Dial, OT/L   Acute OT Clinical Specialist Acute Rehabilitation Services Pager [phone] Office [phone]  04/18/2021, 9:04 AM

## 2021-04-18 NOTE — Progress Notes (Signed)
Hospitalist and GI PA notified of passing of a large amount of dark red blood and clots from rectum. This is the first bloody bowel movement she has had since last night around 10 pm.

## 2021-04-18 NOTE — Consult Note (Addendum)
[facility] Renal Consultation Note    Indication for Consultation:  Management of ESRD/hemodialysis; anemia, hypertension/volume and secondary hyperparathyroidism PCP: Dr. [name] Nephrologist: Dr. Joelyn Oms  HPI: Carolyn Russo is a 70 y.o. female with ESRD who recently started hemodialysis 03/17/2021. PMH: DMT2, HTN, bradycardia, HFpEF, G2DD, anemia of ESRD, SHPT. Last HD 04/16/2021 truncates treatments-ran 3 hours 15 min, left 0.9 above OP EDW.   Patient presented to ED 04/16/21 post HD with C/O L sided weakness, slurred speech for 3 days. She was seen in ED 04/14/2021 had normal CT of head, left ED before she could be seen D/T long waiting time. Apparently symptoms worsened again 04/16/2021 and she returned to ED for evaluation post HD. On arrival, she had C/O slurred speech, L sided weakness with inability to stand, N & V. Upon arrival, CXR and admission labs unremarkable, HGB 9.0. MR of head demonstrated 2.1 cm acute ischemic nonhemorrhagic right basal ganglia infarct. Neurology was consulted, unfortunately, she was out of TPA window. She has been seen by neurology and admitted for R basal subcortical CVA. 04/17/2021 she developed LGIB with fall in HGB to 7.2. Per nursing staff, she starting passing bright red blood with clots from rectum. She is S/P 2 units of PRBCS. GI has not been consulted.   She is seen in room without family present. She is very lethargic, nods head but does not open eyes. She squeezes and releases my hand with her right hand. BP noted to be labile. SR on monitor. HPI gathered from EMR and nursing staff.   Past Medical History:  Diagnosis Date   Allergy    Anterior chest wall pain    Appendicitis 1965   Asthma    Body mass index 37.0-37.9, adult    Breast pain    Cataract    both eyes   Chronic renal disease, stage II    Dehydration 2014   Deviated septum 1971   Diabetes mellitus    Dyspnea 2014   Extrinsic asthma    WITH ASTHMA ATTACK    Fibroid 1980   GERD (gastroesophageal reflux disease)    Heart murmur    Hx gestational diabetes    Hyperlipidemia    Hypertension 2014   Inguinal hernia 1959   Malaise and fatigue 2014   Nephropathy    Non-IgE mediated allergic asthma 2014   Obesity    Pelvic pain    Pregnancy, high-risk 1985   Tonsillitis 1968   Uterine fibroid 1980   Past Surgical History:  Procedure Laterality Date   [place]     bilateral cataract    HERNIA REPAIR  1959   IR FLUORO GUIDE CV LINE RIGHT  03/18/2021   IR US GUIDE VASC ACCESS RIGHT  03/18/2021   LEFT HEART CATH AND CORONARY ANGIOGRAPHY N/A 04/04/2017   Procedure: Left Heart Cath and Coronary Angiography;  Surgeon: Belva Crome, MD;  Location: [facility];  Service: Cardiovascular;  Laterality: N/A;   MYOMECTOMY  1980, 2004, 2007   RHINOPLASTY  1971   ROTATOR CUFF REPAIR  2003   SURGICAL REPAIR OF HEMORRHAGE  2015   TONSILLECTOMY  1968   Family History  Problem Relation Age of Onset   Cancer Mother        abdominal melamona   Psoriasis Mother    Alzheimer's disease Father    Cancer Cousin  colon    Diabetes Cousin    Diabetes Maternal Aunt    Colon cancer Neg Hx    Colon polyps Neg Hx    Esophageal cancer Neg Hx    Rectal cancer Neg Hx    Stomach cancer Neg Hx    Breast cancer Neg Hx    Social History:  reports that she has never smoked. She has never used smokeless tobacco. She reports that she does not drink alcohol and does not use drugs. Allergies  Allergen Reactions   Food Anaphylaxis    Peanuts; Almonds   Other Shortness Of Breath    Peanuts; Almonds   Wheat Bran Shortness Of Breath   Statins Itching and Other (See Comments)    Generalized aches   Iodine Other (See Comments)    The patient denied this allergy to me on 03/16/2021   Shellfish Allergy Other (See Comments)    Mouth gets raw   Sitagliptin     Other reaction(s): Unknown    Tetracycline Other (See Comments)    Raw mouth   Tetracyclines & Related Itching   Contrast Media [Iodinated Diagnostic Agents] Rash    Happened during CT scan over 30 years ago   Prior to Admission medications   Medication Sig Start Date End Date Taking? Authorizing Provider  albuterol (PROVENTIL) (2.5 MG/3ML) 0.083% nebulizer solution Take 3 mLs (2.5 mg total) by nebulization every 4 (four) hours as needed for wheezing or shortness of breath. 03/04/21 03/04/22 Yes Ghumman, Ramandeep, NP  albuterol (VENTOLIN HFA) 108 (90 Base) MCG/ACT inhaler Inhale 2 puffs into the lungs every 4 (four) hours as needed for wheezing or shortness of breath. 03/01/21  Yes [name], MD  aspirin 81 MG tablet Take 81 mg by mouth daily.   Yes [provider]  b complex vitamins capsule Take 1 capsule by mouth daily.   Yes [provider]  BESIVANCE 0.6 % SUSP Place 1 drop into both eyes See admin instructions. After  eye injections 10/26/18  Yes [provider]  Caraway Oil-Levomenthol (FDGARD PO) Take 1 tablet by mouth 2 (two) times daily as needed (nausea).   Yes [provider]  carvedilol (COREG) 3.125 MG tablet Take 1 tablet (3.125 mg total) by mouth 2 (two) times daily with a meal. 03/24/21  Yes Nita Sells, MD  Cholecalciferol (VITAMIN D3 PO) Take 2,000 Units by mouth daily.   Yes [provider]  Continuous Blood Gluc Receiver (DEXCOM G6 RECEIVER) DEVI Use as directed to check blood sugars 3 times per day dx: e11.65 Patient taking differently: 1 each by Other route See admin instructions. Use as directed to check blood sugars 3 times per day dx: e11.65 04/14/20  Yes [name], MD  Continuous Blood Gluc Sensor (DEXCOM G6 SENSOR) MISC Use as directed to check blood sugars 3 times per day dx: e11.65 Patient taking differently: 1 each by Other route See admin instructions. Use as directed to check blood sugars 3 times per day dx: e11.65 04/14/20  Yes [name], MD  Continuous Blood Gluc Transmit (DEXCOM G6 TRANSMITTER) MISC Use as directed to check blood sugars 3 times per day dx: e11.65 Patient taking differently: 1 each by Other route See admin instructions. Use as directed to check blood sugars 3 times per day dx: e11.65 04/14/20  Yes [name], MD  docusate sodium (COLACE) 100 MG capsule Take 100 mg by mouth daily as needed for mild constipation.   Yes [provider]  fluticasone Asencion Islam)  50 MCG/ACT nasal spray Place 2 sprays into the nose daily as needed for allergies.    Yes [provider]  furosemide (LASIX) 40 MG tablet Take 1-2 tablets (40-80 mg total) by mouth every Monday, Wednesday, and Friday. 80 mg twice a week. 40 mg all other days 03/24/21  Yes Nita Sells, MD  hydrocortisone (ANUSOL-HC) 2.5 % rectal cream Place 1 application rectally 2 (two) times daily. 04/12/21  Yes [name], MD  insulin aspart protamine - aspart (NOVOLOG MIX 70/30 FLEXPEN) (70-30) 100 UNIT/ML FlexPen Inject 1 mL (100 Units total) into the skin 2 (two) times daily with a meal. 20 units in the morning and 15 units at night Patient taking differently: Inject 15-20 Units into the skin 2 (two) times daily with a meal. 20 units in the morning and 15 units at night 03/23/21  Yes Nita Sells, MD  magnesium oxide (MAG-OX) 400 (241.3 Mg) MG tablet Take 400 mg by mouth daily.   Yes [provider]  ondansetron (ZOFRAN) 4 MG tablet Take 1 tablet (4 mg total) by mouth every 6 (six) hours as needed for nausea or vomiting. 03/29/21 03/29/22 Yes [name], MD  OneTouch Delica Lancets 44I MISC Use as directed to check blood sugars before breakfast and dinner dx: e11.65 Patient taking differently: 1 each by Other route See admin instructions. Use as directed to check blood sugars before breakfast and dinner dx: e11.65 04/22/20  Yes [name], MD  rosuvastatin (CRESTOR) 20 MG tablet Take 1 tablet (20 mg total) by mouth daily.  11/19/20  Yes Belva Crome, MD  sevelamer carbonate (RENVELA) 800 MG tablet Take 1 tablet (800 mg total) by mouth 3 (three) times daily with meals. 03/24/21  Yes Nita Sells, MD  SYMBICORT 160-4.5 MCG/ACT inhaler Inhale 2 puffs into the lungs 2 (two) times daily. 02/26/21  Yes [name], MD  traMADol (ULTRAM) 50 MG tablet Take 1 tablet (50 mg total) by mouth every 12 (twelve) hours as needed for severe pain. 03/24/21  Yes Charlynne Cousins, MD  vitamin C (ASCORBIC ACID) 500 MG tablet Take 500 mg by mouth daily.   Yes [provider]  mometasone-formoterol (DULERA) 200-5 MCG/ACT AERO Inhale 2 puffs into the lungs 2 (two) times daily. 02/25/21 04/16/21 Yes [name], MD  iron polysaccharides (NIFEREX) 150 MG capsule Take 1 capsule (150 mg total) by mouth daily. Patient not taking: No sig reported 03/24/21 04/16/21  Nita Sells, MD   Current Facility-Administered Medications  Medication Dose Route Frequency Provider Last Rate Last Admin   acetaminophen (TYLENOL) tablet 650 mg  650 mg Oral Q4H PRN Fuller Plan A, MD   650 mg at 04/16/21 2001   Or   acetaminophen (TYLENOL) 160 MG/5ML solution 650 mg  650 mg Per Tube Q4H PRN Norval Morton, MD       Or   acetaminophen (TYLENOL) suppository 650 mg  650 mg Rectal Q4H PRN Fuller Plan A, MD       albuterol (PROVENTIL) (2.5 MG/3ML) 0.083% nebulizer solution 2.5 mg  2.5 mg Nebulization Q4H PRN Fuller Plan A, MD   2.5 mg at 04/18/21 3474   ALPRAZolam Duanne Moron) tablet 0.5 mg  0.5 mg Oral TID PRN Tawni Millers, MD   0.5 mg at 04/17/21 1831   arformoterol (BROVANA) nebulizer solution 15 mcg  15 mcg Nebulization BID Fuller Plan A, MD   15 mcg at 04/18/21 0822   budesonide (PULMICORT) nebulizer solution 0.5 mg  0.5 mg Nebulization BID  Fuller Plan A, MD   0.5 mg at 04/18/21 3295   carvedilol (COREG) tablet 3.125 mg  3.125 mg Oral BID WC Fuller Plan A, MD   3.125 mg at 04/17/21 1884   Chlorhexidine Gluconate  Cloth 2 % PADS 6 each  6 each Topical Daily Fuller Plan A, MD   6 each at 04/17/21 0801   docusate sodium (COLACE) capsule 100 mg  100 mg Oral Daily PRN Norval Morton, MD       fluticasone (FLONASE) 50 MCG/ACT nasal spray 2 spray  2 spray Each Nare Daily PRN Fuller Plan A, MD       furosemide (LASIX) tablet 40-80 mg  40-80 mg Oral Q M,W,F Smith, Rondell A, MD       hydrALAZINE (APRESOLINE) injection 10 mg  10 mg Intravenous Q4H PRN Fuller Plan A, MD   10 mg at 04/16/21 1846   hydrocortisone (ANUSOL-HC) 2.5 % rectal cream 1 application  1 application Rectal BID PRN Norval Morton, MD       hydrocortisone (ANUSOL-HC) suppository 25 mg  25 mg Rectal BID Fuller Plan A, MD   25 mg at 04/16/21 2142   ondansetron (ZOFRAN) 8 mg in sodium chloride 0.9 % 50 mL IVPB  8 mg Intravenous Q6H PRN Metzger-Cihelka, Desiree, NP       rosuvastatin (CRESTOR) tablet 20 mg  20 mg Oral Daily Tamala Julian, Rondell A, MD   20 mg at 04/17/21 0913   senna-docusate (Senokot-S) tablet 1 tablet  1 tablet Oral QHS PRN Fuller Plan A, MD       sevelamer carbonate (RENVELA) tablet 800 mg  800 mg Oral TID WC Smith, Rondell A, MD   800 mg at 04/17/21 0912   traMADol (ULTRAM) tablet 50 mg  50 mg Oral Q12H PRN Norval Morton, MD   50 mg at 04/16/21 1555   Labs: Basic Metabolic Panel: Recent Labs  Lab 04/14/21 1824 04/14/21 1833 04/16/21 1215 04/17/21 0224  NA 135 135 136 136  K 4.0 4.0 4.0 3.6  CL 94* 93* 96* 97*  CO2 32  --  34* 29  GLUCOSE 143* 139* 192* 90  BUN 14 16 12 16   CREATININE 3.58* 3.60* 3.02* 4.08*  CALCIUM 8.5*  --  8.2* 8.8*  PHOS  --   --   --  3.7   Liver Function Tests: Recent Labs  Lab 04/14/21 1824 04/17/21 0224  AST 27  --   ALT 21  --   ALKPHOS 59  --   BILITOT 0.7  --   PROT 6.9  --   ALBUMIN 3.4* 3.1*   No results for input(s): LIPASE, AMYLASE in the last 168 hours. No results for input(s): AMMONIA in the last 168 hours. CBC: Recent Labs  Lab 04/14/21 1824  04/14/21 1833 04/16/21 1215 04/17/21 0224 04/17/21 1504 04/17/21 2040  WBC 9.9  --  9.0 9.9  --   --   NEUTROABS 5.4  --  4.2 3.6  --   --   HGB 8.8*   < > 9.0* 8.9* 8.3* 7.2*  HCT 26.7*   < > 27.3* 26.1* 24.8* 21.5*  MCV 87.5  --  88.1 85.9  --   --   PLT 265  --  282 267  --   --    < > = values in this interval not displayed.   Cardiac Enzymes: No results for input(s): CKTOTAL, CKMB, CKMBINDEX, TROPONINI in the last 168 hours. CBG: Recent Labs  Lab 04/17/21 0728 04/17/21 1958 04/18/21 0008 04/18/21 0421 04/18/21 0920  GLUCAP 105* 207* 171* 116* 95   Iron Studies: No results for input(s): IRON, TIBC, TRANSFERRIN, FERRITIN in the last 72 hours. Studies/Results: MR ANGIO HEAD WO CONTRAST  Result Date: 04/17/2021 CLINICAL DATA:  Initial evaluation for acute stroke. EXAM: MRA NECK WITHOUT CONTRAST MRA HEAD WITHOUT CONTRAST TECHNIQUE: Multiplanar and multiecho pulse sequences of the neck were obtained without intravenous contrast. Angiographic images of the neck were obtained using MRA technique without and with intravenous contrast; Angiographic images of the Circle of Willis were obtained using MRA technique without intravenous contrast. COMPARISON:  Prior MRI from 04/16/2021. FINDINGS: MRA NECK FINDINGS AORTIC ARCH: Examination severely degraded by motion artifact. Partially visualized aortic arch grossly normal in caliber with normal branch pattern. No appreciable stenosis about the origin of the great vessels. RIGHT CAROTID SYSTEM: Visualized right CCA patent without stenosis. No appreciable stenosis or significant irregularity about the right carotid bifurcation. Visualized right ICA patent without stenosis or occlusion. LEFT CAROTID SYSTEM: Visualized left CCA patent without stenosis. No significant atheromatous narrowing or irregularity about the left bifurcation. Left ICA patent without stenosis or occlusion. VERTEBRAL ARTERIES: Origins of the vertebral arteries not well assessed  on this exam. Visualized portions of the vertebral arteries patent with antegrade flow, with no appreciable stenosis or occlusion. MRA HEAD FINDINGS ANTERIOR CIRCULATION: Examination moderately degraded by motion artifact. Visualized distal cervical segments of the internal carotid arteries are patent with antegrade flow. Petrous, cavernous, and supraclinoid segments patent without stenosis or other abnormality. A1 segments patent. Normal anterior communicating artery complex. Anterior cerebral arteries grossly patent to their distal aspects without appreciable stenosis. M1 segments patent bilaterally. Ill-defined attenuation involving the distal left M1 segment and proximal M2 branches favored to be artifactual on this exam. No definite proximal stenosis. MCA branches well perfused and symmetric. POSTERIOR CIRCULATION: Vertebral arteries codominant and patent to the vertebrobasilar junction without stenosis. Both PICA origins patent and normal. Basilar patent to its distal aspect without stenosis. Superior cerebellar arteries patent bilaterally. Both PCA supplied via the basilar as well as prominent bilateral posterior communicating arteries. PCAs patent to their distal aspects without definite proximal stenosis. No visible aneurysm or other vascular abnormality. IMPRESSION: 1. Technically limited exam due to extensive motion artifact. 2. Grossly negative MRA of the head and neck. No large vessel occlusion. No proximal high-grade or correctable stenosis. Electronically Signed   By: Jeannine Boga M.D.   On: 04/17/2021 03:36   MR ANGIO NECK WO CONTRAST  Result Date: 04/17/2021 CLINICAL DATA:  Initial evaluation for acute stroke. EXAM: MRA NECK WITHOUT CONTRAST MRA HEAD WITHOUT CONTRAST TECHNIQUE: Multiplanar and multiecho pulse sequences of the neck were obtained without intravenous contrast. Angiographic images of the neck were obtained using MRA technique without and with intravenous contrast;  Angiographic images of the Circle of Willis were obtained using MRA technique without intravenous contrast. COMPARISON:  Prior MRI from 04/16/2021. FINDINGS: MRA NECK FINDINGS AORTIC ARCH: Examination severely degraded by motion artifact. Partially visualized aortic arch grossly normal in caliber with normal branch pattern. No appreciable stenosis about the origin of the great vessels. RIGHT CAROTID SYSTEM: Visualized right CCA patent without stenosis. No appreciable stenosis or significant irregularity about the right carotid bifurcation. Visualized right ICA patent without stenosis or occlusion. LEFT CAROTID SYSTEM: Visualized left CCA patent without stenosis. No significant atheromatous narrowing or irregularity about the left bifurcation. Left ICA patent without stenosis or occlusion. VERTEBRAL ARTERIES: Origins of the vertebral arteries not  well assessed on this exam. Visualized portions of the vertebral arteries patent with antegrade flow, with no appreciable stenosis or occlusion. MRA HEAD FINDINGS ANTERIOR CIRCULATION: Examination moderately degraded by motion artifact. Visualized distal cervical segments of the internal carotid arteries are patent with antegrade flow. Petrous, cavernous, and supraclinoid segments patent without stenosis or other abnormality. A1 segments patent. Normal anterior communicating artery complex. Anterior cerebral arteries grossly patent to their distal aspects without appreciable stenosis. M1 segments patent bilaterally. Ill-defined attenuation involving the distal left M1 segment and proximal M2 branches favored to be artifactual on this exam. No definite proximal stenosis. MCA branches well perfused and symmetric. POSTERIOR CIRCULATION: Vertebral arteries codominant and patent to the vertebrobasilar junction without stenosis. Both PICA origins patent and normal. Basilar patent to its distal aspect without stenosis. Superior cerebellar arteries patent bilaterally. Both PCA  supplied via the basilar as well as prominent bilateral posterior communicating arteries. PCAs patent to their distal aspects without definite proximal stenosis. No visible aneurysm or other vascular abnormality. IMPRESSION: 1. Technically limited exam due to extensive motion artifact. 2. Grossly negative MRA of the head and neck. No large vessel occlusion. No proximal high-grade or correctable stenosis. Electronically Signed   By: Jeannine Boga M.D.   On: 04/17/2021 03:36   MR BRAIN WO CONTRAST  Result Date: 04/16/2021 CLINICAL DATA:  Initial evaluation for acute stroke. EXAM: MRI HEAD WITHOUT CONTRAST TECHNIQUE: Multiplanar, multiecho pulse sequences of the brain and surrounding structures were obtained without intravenous contrast. COMPARISON:  Prior head CT from 04/14/2021. FINDINGS: Brain: Cerebral volume within normal limits for age. Patchy and hazy T2/FLAIR hyperintensity within the periventricular deep white matter both cerebral hemispheres most consistent with chronic small vessel ischemic disease, moderately advanced in nature. 2.1 cm focus of restricted diffusion seen involving the right basal ganglia, consistent with an acute ischemic infarct. No associated hemorrhage or mass effect. No other evidence for acute or subacute ischemia. Gray-white matter differentiation otherwise maintained. No other areas of acute or subacute infarction. No encephalomalacia to suggest chronic cortical infarction. No foci of susceptibility artifact to suggest acute or chronic intracranial hemorrhage. No mass lesion, midline shift or mass effect. No hydrocephalus or extra-axial fluid collection. Pituitary gland within normal limits. Suprasellar region normal. Midline structures intact. Vascular: Major intracranial vascular flow voids are maintained. Skull and upper cervical spine: Craniocervical junction within normal limits. Bone marrow signal intensity normal. No scalp soft tissue abnormality. Sinuses/Orbits:  Patient status post bilateral ocular lens replacement. Globes and orbital soft tissues demonstrate no acute finding. Mild scattered mucosal thickening noted within the ethmoidal air cells. Paranasal sinuses are otherwise clear. No mastoid effusion. Inner ear structures grossly normal. Other: None. IMPRESSION: 1. 2.1 cm acute ischemic nonhemorrhagic right basal ganglia infarct. 2. Underlying moderate chronic microvascular ischemic disease. Electronically Signed   By: Jeannine Boga M.D.   On: 04/16/2021 21:16   DG CHEST PORT 1 VIEW  Result Date: 04/16/2021 CLINICAL DATA:  Stroke. EXAM: PORTABLE CHEST 1 VIEW COMPARISON:  Chest x-ray dated Mar 23, 2021. FINDINGS: Unchanged tunneled right internal jugular dialysis catheter. Stable cardiomegaly. Unchanged mild left basilar atelectasis. No focal consolidation, pleural effusion, or pneumothorax. No acute osseous abnormality. IMPRESSION: No active disease. Electronically Signed   By: Titus Dubin M.D.   On: 04/16/2021 20:32    ROS: As per HPI otherwise negative.  Physical Exam: Vitals:   04/18/21 0431 04/18/21 0452 04/18/21 0739 04/18/21 0822  BP: (!) 95/36 (!) 123/47 (!) 177/67   Pulse: 82 81 90  Resp: 12 15 16    Temp: 98.2 F (36.8 C) 98 F (36.7 C) 98.6 F (37 C)   TempSrc: Oral Oral Oral   SpO2: 96% 97% 99% 99%  Weight:      Height:         General: Chronically ill appearing elderly female in no acute distress. Head: Normocephalic, atraumatic, sclera non-icteric, mucus membranes are moist Neck: Supple. JVD not elevated. Lungs: Clear bilaterally to auscultation without wheezes, rales, or rhonchi. Breathing is unlabored. Heart: RRR with S1 S2. No murmurs, rubs, or gallops appreciated. Abdomen: Soft, non-tender, non-distended with normoactive bowel sounds. No rebound/guarding. No obvious abdominal masses. Lower extremities:without edema or ischemic changes, no open wounds  Neuro: Lethargic, not opening eyes to verbal stimuli but nods  head. Pupils 2-3 mm sluggish. Squeezes and releases with R hand. Not following simple commands.  Psych: Unable to evaluate Dialysis Access: RIJ TDC drsg CDI.   Dialysis Orders: Center: [facility] MWF 4 hrs 180NRe 400/Autoflow 1.5 86.5 kg 2.0K/2.0 Ca TDC -heparin 2000 units IV TIW -Mircera 100 mcg IV q 2 weeks (last dose 04/14/2021 -Hectorol 5 mcg IV TIW  Acute subcortical R CVA-per Primary/Neurology Acute LGIB-H/O hemorrhoidal bleeding in past. new onset 06/25 post clopidogrel load. HGB 9.0 on admission, fell to 7.2. S/P 2 units PRBCS this AM, repeat HGB pending. Clopidogrel and ASA on hold. No heparin with HD. Per primary.  ESRD -  MWF Next HD 04/19/2021. DC Heparin-new GIB. Use 4.0 K bath. K+ 3.6.   Hypertension/volume  - BP is labile. She is under EDW. No evidence of volume overload by exam. UF as tolerated   Anemia  - As noted in # 2. Follow HGB. Transfuse PRN.  Metabolic bone disease -  NPO at present.  PTH only 110 03/26/21 on 5 mcg Hectorol. Hold VDRA.  PO4 at goal. Renvela 1 G PO TIW when able to eat.   Nutrition - NPO at present. Speech therapy consulted. DTM2- per primary  Jimmye Norman. Owens Shark, NP-C 04/18/2021, 9:49 AM  [employer] [phone]

## 2021-04-18 NOTE — Op Note (Addendum)
[facility] Patient Name: Carolyn Russo Procedure Date : 04/18/2021 MRN: [id number] Attending MD: Gatha Mayer , MD Date of Birth: August 09, 1951 CSN: [id number] Age: 70 Admit Type: Inpatient Procedure:                Flexible Sigmoidoscopy Indications:              Hematochezia Providers:                Gatha Mayer, MD, Particia Nearing, RN, Tyrone Apple, Technician Referring MD:              Medicines:                Propofol per Anesthesia, Monitored Anesthesia Care Complications:            No immediate complications. Estimated Blood Loss:     Estimated blood loss: none. Procedure:                Pre-Anesthesia Assessment:                           - Prior to the procedure, a History and Physical                            was performed, and patient medications and                            allergies were reviewed. The patient's tolerance of                            previous anesthesia was also reviewed. The risks                            and benefits of the procedure and the sedation                            options and risks were discussed with the patient.                            All questions were answered, and informed consent                            was obtained. Prior Anticoagulants: The patient                            last took Plavix (clopidogrel) 1 day prior to the                            procedure. ASA Grade Assessment: III - A patient                            with severe systemic disease. After reviewing the  risks and benefits, the patient was deemed in                            satisfactory condition to undergo the procedure.                           - Prior to the procedure, a History and Physical                            was performed, and patient medications and                            allergies were reviewed. The patient's tolerance of                             previous anesthesia was also reviewed. The risks                            and benefits of the procedure and the sedation                            options and risks were discussed with the patient.                            All questions were answered, and informed consent                            was obtained. Prior Anticoagulants: The patient                            last took Plavix (clopidogrel) 1 day prior to the                            procedure. ASA Grade Assessment: III - A patient                            with severe systemic disease. After reviewing the                            risks and benefits, the patient was deemed in                            satisfactory condition to undergo the procedure.                           After obtaining informed consent, the scope was                            passed under direct vision. The [id number]-H190DL                            ([id number]) Olympus pediatric colonscope was  introduced through the anus and advanced to the the                            sigmoid colon. The flexible sigmoidoscopy was                            performed with difficulty due to poor endoscopic                            visualization. The patient tolerated the procedure                            well. The quality of the bowel preparation was                            fair-poor - NO PREP. Scope In: Scope Out: Findings:      Hemorrhoids were found on perianal exam.      External and internal hemorrhoids were found.      A moderate amount of stool was found in the rectum and in the sigmoid       colon, making visualization difficult. Impression:               - Hemorrhoids found on perianal exam.                           - External and internal hemorrhoids.                           - Stool in the rectum and in the sigmoid colon.                           - No specimens collected. Recommendation:           - Return patient  to hospital ward for ongoing care.                           - Continue to Tx hemorrhoids                           hold clopidogrel                           allow clears                           we will f/u                           may be having hemorrhoids bleeding profusely -                            could be diverticulosis vs AVM - did not have                            either on 2019 colonoscopy but both may not always  be seen                           colonoscopy likely next step but she is not cleared                            to take liquids - perhaps we will have clinical                            clarification w/o colonoscopy Procedure Code(s):        --- Professional ---                           [phone], Sigmoidoscopy, flexible; diagnostic,                            including collection of specimen(s) by brushing or                            washing, when performed (separate procedure) Diagnosis Code(s):        --- Professional ---                           K64.8, Other hemorrhoids                           K92.1, Melena (includes Hematochezia) CPT copyright 2019 American [employer] Association. All rights reserved. The codes documented in this report are preliminary and upon coder review may  be revised to meet current compliance requirements. Gatha Mayer, MD 04/18/2021 5:47:56 PM This report has been signed electronically. Number of Addenda: 0

## 2021-04-18 NOTE — Anesthesia Preprocedure Evaluation (Signed)
Anesthesia Evaluation  Patient identified by MRN, date of birth, ID band Patient confused    Reviewed: Allergy & Precautions, NPO status , Patient's Chart, lab work & pertinent test results  Airway Mallampati: II  TM Distance: >3 FB Neck ROM: Full    Dental  (+) Teeth Intact, Dental Advisory Given   Pulmonary asthma ,    Pulmonary exam normal breath sounds clear to auscultation       Cardiovascular hypertension, + CAD and +CHF  Normal cardiovascular exam Rhythm:Regular Rate:Normal     Neuro/Psych  Headaches, CVA negative psych ROS   GI/Hepatic Neg liver ROS, GERD  ,  Endo/Other  diabetes, Type 2, Insulin DependentObesity   Renal/GU Renal Insufficiency, ESRF and DialysisRenal disease     Musculoskeletal negative musculoskeletal ROS (+)   Abdominal   Peds  Hematology  (+) Blood dyscrasia (Plavix), anemia ,   Anesthesia Other Findings Day of surgery medications reviewed with the patient.  Reproductive/Obstetrics                            Anesthesia Physical Anesthesia Plan  ASA: 3 and emergent  Anesthesia Plan: General   Post-op Pain Management:    Induction: Intravenous  PONV Risk Score and Plan: 3 and Dexamethasone and Ondansetron  Airway Management Planned: Oral ETT  Additional Equipment:   Intra-op Plan:   Post-operative Plan: Extubation in OR  Informed Consent: I have reviewed the patients History and Physical, chart, labs and discussed the procedure including the risks, benefits and alternatives for the proposed anesthesia with the patient or authorized representative who has indicated his/her understanding and acceptance.     Dental advisory given  Plan Discussed with: CRNA  Anesthesia Plan Comments:         Anesthesia Quick Evaluation

## 2021-04-18 NOTE — Op Note (Signed)
[facility] Patient Name: Carolyn Russo Procedure Date : 04/18/2021 MRN: [id number] Attending MD: Gatha Mayer , MD Date of Birth: 07/06/1951 CSN: [id number] Age: 70 Admit Type: Inpatient Procedure:                Upper GI endoscopy Indications:              Hematochezia Providers:                Gatha Mayer, MD, Particia Nearing, RN, Tyrone Apple, Technician Referring MD:              Medicines:                Propofol per Anesthesia, Monitored Anesthesia Care Complications:            No immediate complications. Estimated Blood Loss:     Estimated blood loss: none. Procedure:                Pre-Anesthesia Assessment:                           - Prior to the procedure, a History and Physical                            was performed, and patient medications and                            allergies were reviewed. The patient's tolerance of                            previous anesthesia was also reviewed. The risks                            and benefits of the procedure and the sedation                            options and risks were discussed with the patient.                            All questions were answered, and informed consent                            was obtained. Prior Anticoagulants: The patient                            last took Plavix (clopidogrel) 1 day prior to the                            procedure. ASA Grade Assessment: III - A patient                            with severe systemic disease. After reviewing the  risks and benefits, the patient was deemed in                            satisfactory condition to undergo the procedure.                           After obtaining informed consent, the endoscope was                            passed under direct vision. Throughout the                            procedure, the patient's blood pressure, pulse, and                            oxygen  saturations were monitored                            continuously.After obtaining informed consent, the                            endoscope was passed under direct vision.                            Throughout the procedure, the patient's blood                            pressure, pulse, and oxygen saturations were                            monitored continuously. The GIF-H190 ([id number])                            Olympus gastroscope was introduced through the                            mouth, and advanced to the second part of duodenum.                            The upper GI endoscopy was accomplished without                            difficulty. The patient tolerated the procedure                            well. Scope In: Scope Out: Findings:      The esophagus was normal.      The stomach was normal.      The examined duodenum was normal.      The cardia and gastric fundus were normal on retroflexion. Impression:               - Normal esophagus.                           - Normal stomach.                           -  Normal examined duodenum.                           - No specimens collected. Recommendation:           - See the other procedure note for documentation of                            additional recommendations. Procedure Code(s):        --- Professional ---                           [phone], Esophagogastroduodenoscopy, flexible,                            transoral; diagnostic, including collection of                            specimen(s) by brushing or washing, when performed                            (separate procedure) Diagnosis Code(s):        --- Professional ---                           K92.1, Melena (includes Hematochezia) CPT copyright 2019 American Medical Association. All rights reserved. The codes documented in this report are preliminary and upon coder review may  be revised to meet current compliance requirements. Gatha Mayer, MD 04/18/2021  5:41:43 PM This report has been signed electronically. Number of Addenda: 0

## 2021-04-18 NOTE — Progress Notes (Addendum)
Perineal care given with small to moderate bleeding from rectum with clots, will cont to monitor.  0012 Update given to Dr Olevia Bowens re: hemoglobin 7.2 awaiting return instructions.  0022 Blood bank called to report 2 units PRBC's ready for pt.  0048 Verbal consent obtained from husband and verified with self and Charge Nurse.  [phone] Blood transfusion started at this time @ 198ml/hr with verification with CN.  0121 No transfusion reaction noted, rate increased to 125 ml/hr.  0437 Second unit of PRBC's started at this time @ 143ml / hr, unit was verified with CN.  0452 No transfusion reaction noted within 15 minutes after start of transfusion, rate increased to 125 ml/hr.

## 2021-04-19 ENCOUNTER — Encounter (HOSPITAL_COMMUNITY): Payer: Self-pay | Admitting: Internal Medicine

## 2021-04-19 ENCOUNTER — Ambulatory Visit: Payer: Medicare Other | Admitting: Physician Assistant

## 2021-04-19 DIAGNOSIS — I63039 Cerebral infarction due to thrombosis of unspecified carotid artery: Secondary | ICD-10-CM

## 2021-04-19 DIAGNOSIS — D638 Anemia in other chronic diseases classified elsewhere: Secondary | ICD-10-CM

## 2021-04-19 DIAGNOSIS — N186 End stage renal disease: Secondary | ICD-10-CM

## 2021-04-19 DIAGNOSIS — K921 Melena: Secondary | ICD-10-CM

## 2021-04-19 LAB — BASIC METABOLIC PANEL
Anion gap: 13 (ref 5–15)
BUN: 30 mg/dL — ABNORMAL HIGH (ref 8–23)
CO2: 28 mmol/L (ref 22–32)
Calcium: 8.7 mg/dL — ABNORMAL LOW (ref 8.9–10.3)
Chloride: 99 mmol/L (ref 98–111)
Creatinine, Ser: 6.39 mg/dL — ABNORMAL HIGH (ref 0.44–1.00)
GFR, Estimated: 6 mL/min — ABNORMAL LOW (ref >60)
Glucose, Bld: 87 mg/dL (ref 70–99)
Potassium: 3.8 mmol/L (ref 3.5–5.1)
Sodium: 140 mmol/L (ref 135–145)

## 2021-04-19 LAB — HEMOGLOBIN AND HEMATOCRIT, BLOOD
HCT: 27.6 % — ABNORMAL LOW (ref 36.0–46.0)
HCT: 27.6 % — ABNORMAL LOW (ref 36.0–46.0)
HCT: 28.3 % — ABNORMAL LOW (ref 36.0–46.0)
HCT: 28.3 % — ABNORMAL LOW (ref 36.0–46.0)
Hemoglobin: 9.3 g/dL — ABNORMAL LOW (ref 12.0–15.0)
Hemoglobin: 9.2 g/dL — ABNORMAL LOW (ref 12.0–15.0)
Hemoglobin: 9.6 g/dL — ABNORMAL LOW (ref 12.0–15.0)
Hemoglobin: 9.4 g/dL — ABNORMAL LOW (ref 12.0–15.0)

## 2021-04-19 LAB — BPAM RBC
Blood Product Expiration Date: 202206302359
Blood Product Expiration Date: 202206302359
ISSUE DATE / TIME: 202206260101
ISSUE DATE / TIME: 202206260429
Unit Type and Rh: 5100
Unit Type and Rh: 5100

## 2021-04-19 LAB — TYPE AND SCREEN
ABO/RH(D): O POS
Antibody Screen: NEGATIVE
Unit division: 0
Unit division: 0

## 2021-04-19 LAB — GLUCOSE, CAPILLARY
Glucose-Capillary: 85 mg/dL (ref 70–99)
Glucose-Capillary: 96 mg/dL (ref 70–99)
Glucose-Capillary: 127 mg/dL — ABNORMAL HIGH (ref 70–99)
Glucose-Capillary: 186 mg/dL — ABNORMAL HIGH (ref 70–99)
Glucose-Capillary: 195 mg/dL — ABNORMAL HIGH (ref 70–99)

## 2021-04-19 NOTE — Progress Notes (Addendum)
PROGRESS NOTE    SYNA GAD  TOI:[id number] DOB: 01-30-1951 DOA: 04/16/2021 PCP: [name], MD    Brief Narrative:  70 year old with a history of HLD, DM-2, CAD, iron deficiency anemia, bronchial asthma, ESRD on HD MWF-who presented with left-sided weakness-she was found to have acute CVA.  She was started on aspirin/Plavix-further hospital course was complicated by lower GI bleeding with acute blood loss anemia.  See below for further details.  Significant events: 5/17-6/1: Hospitalization for volume overload-progressive renal failure-started on HD 6/24>> admit for acute CVA 6/25>> lower GI bleeding-hematochezia-passing clots- 6/26 >>2 units of PRBC transfused.  Significant studies: 5/19>> TTE: EF 50% 6/24>> CXR: No PNA. 6/22>> CT head: No acute intracranial findings. 6/24>> CT head: Right basal ganglia infarct 6/24>> A1c: 5.2 6/24>> LDL: 70 6/25>> MRI brain: 2.1 cm acute nonhemorrhagic right basal ganglia infarct. 6/25>> MRA brain/neck: No high-grade/correctable stenosis.  Procedures: 6/26>> EGD: No obvious bleeding source evident.  Normal esophagus/stomach/duodenum. 6/26>> flexible sigmoidoscopy: Hemorrhoids-large amount of stool-visualization difficult.  Subjective:  Lying comfortably in bed-no hematochezia overnight.  Assessment & Plan: Acute CVA: Likely due to small vessel disease-very mild left-sided weakness continues-recommendations from neurology were to continue aspirin/Plavix (now stopped due to GI bleed) and Crestor on discharge.    Acute lower GI bleeding with blood loss anemia: No further hematochezia overnight-hemoglobin remains stable-EGD without any foci of GI bleeding-flexible sigmoidoscopy showed hemorrhoids-stool obscured further visualization.  GI following-Per GI-patient will not be able to tolerate colonoscopy prep.  Continue to follow CBC and transfuse accordingly-await further input from GI.  ESRD on HD MWF: Nephrology following and directing  care.  Chronic diastolic heart failure: Volume status stable-volume overload with HD.  HTN: Allow permissive hypertension in the setting of acute CVA-and intermittent GI bleeding.  HLD: Continue statin  DM-2: CBG stable with SSI.  Recent Labs    04/19/21 0346 04/19/21 0730 04/19/21 1155  GLUCAP 85 96 127*    Obesity: Body mass index is 33.83 kg/m.   Inpatient/Observation status Remains inpatient appropriate because:Inpatient level of care appropriate due to severity of illness  Dispo: The patient is from: Home              Anticipated d/c is to: Home              Patient currently is not medically stable to d/c.  Ongoing GI bleeding.   Difficult to place patient No   DVT prophylaxis: Scd   Code Status:   full  Family Communication: Spouse-Dr. [name] (ophthalmologist) [phone] over the phone on 6/27    Consultants:  Neurology GI  Renal  Subjective: No hematochezia overnight-lying comfortably in bed.   Objective: Vitals:   04/19/21 0700 04/19/21 0800 04/19/21 0852 04/19/21 1153  BP: (!) 133/51 (!) 133/51  (!) 157/64  Pulse: 92 81  82  Resp: 16 16 18 16   Temp: 98.2 F (36.8 C)   97.9 F (36.6 C)  TempSrc: Axillary   Oral  SpO2: 100% 100%  100%  Weight:      Height:        Intake/Output Summary (Last 24 hours) at 04/19/2021 1437 Last data filed at 04/19/2021 0800 Gross per 24 hour  Intake 2266.25 ml  Output --  Net 2266.25 ml    Filed Weights   04/16/21 1154 04/18/21 1650  Weight: 83.9 kg 83.9 kg    Examination:  Gen Exam:Alert awake-not in any distress HEENT:atraumatic, normocephalic Chest: B/L clear to auscultation anteriorly CVS:S1S2 regular Abdomen:soft non tender, non distended  Extremities:no edema Neurology: Mild left-sided weakness-mostly arm. Skin: no rash   Data Reviewed: I have personally reviewed following labs and imaging studies  CBC: Recent Labs  Lab 04/14/21 1824 04/14/21 1833 04/16/21 1215 04/17/21 0224  04/17/21 1504 04/18/21 1006 04/18/21 1621 04/18/21 2125 04/19/21 0413 04/19/21 0959  WBC 9.9  --  9.0 9.9  --  12.9*  --   --   --   --   NEUTROABS 5.4  --  4.2 3.6  --  6.2  --   --   --   --   HGB 8.8*   < > 9.0* 8.9*   < > 9.9* 10.2* 10.5* 9.3* 9.2*  HCT 26.7*   < > 27.3* 26.1*   < > 29.2* 30.0* 30.0* 27.6* 27.6*  MCV 87.5  --  88.1 85.9  --  87.7  --   --   --   --   PLT 265  --  282 267  --  233  --   --   --   --    < > = values in this interval not displayed.    Basic Metabolic Panel: Recent Labs  Lab 04/14/21 1824 04/14/21 1833 04/16/21 1215 04/17/21 0224 04/18/21 1006 04/19/21 0413  NA 135 135 136 136 136 140  K 4.0 4.0 4.0 3.6 3.9 3.8  CL 94* 93* 96* 97* 98 99  CO2 32  --  34* 29 26 28   GLUCOSE 143* 139* 192* 90 101* 87  BUN 14 16 12 16  27* 30*  CREATININE 3.58* 3.60* 3.02* 4.08* 6.06* 6.39*  CALCIUM 8.5*  --  8.2* 8.8* 8.7* 8.7*  PHOS  --   --   --  3.7  --   --     GFR: Estimated Creatinine Clearance: 8 mL/min (A) (by C-G formula based on SCr of 6.39 mg/dL (H)). Liver Function Tests: Recent Labs  Lab 04/14/21 1824 04/17/21 0224  AST 27  --   ALT 21  --   ALKPHOS 59  --   BILITOT 0.7  --   PROT 6.9  --   ALBUMIN 3.4* 3.1*    No results for input(s): LIPASE, AMYLASE in the last 168 hours. No results for input(s): AMMONIA in the last 168 hours. Coagulation Profile: Recent Labs  Lab 04/14/21 1824  INR 1.0    Cardiac Enzymes: No results for input(s): CKTOTAL, CKMB, CKMBINDEX, TROPONINI in the last 168 hours. BNP (last 3 results) No results for input(s): PROBNP in the last 8760 hours. HbA1C: Recent Labs    04/16/21 1840  HGBA1C 5.2    CBG: Recent Labs  Lab 04/18/21 2100 04/18/21 2346 04/19/21 0346 04/19/21 0730 04/19/21 1155  GLUCAP 108* 89 85 96 127*    Lipid Profile: Recent Labs    04/16/21 1840  CHOL 135  HDL 47  LDLCALC 70  TRIG 89  CHOLHDL 2.9    Thyroid Function Tests: No results for input(s): TSH, T4TOTAL,  FREET4, T3FREE, THYROIDAB in the last 72 hours. Anemia Panel: No results for input(s): VITAMINB12, FOLATE, FERRITIN, TIBC, IRON, RETICCTPCT in the last 72 hours.    Radiology Studies: I have reviewed all of the imaging during this hospital visit personally     Scheduled Meds:  arformoterol  15 mcg Nebulization BID   budesonide (PULMICORT) nebulizer solution  0.5 mg Nebulization BID   carvedilol  3.125 mg Oral BID WC   Chlorhexidine Gluconate Cloth  6 each Topical Daily   furosemide  40-80 mg  Oral Q M,W,F   hydrocortisone  25 mg Rectal BID   pantoprazole (PROTONIX) IV  40 mg Intravenous Q12H   rosuvastatin  20 mg Oral Daily   sevelamer carbonate  800 mg Oral TID WC   Continuous Infusions:  ondansetron (ZOFRAN) IV       LOS: 3 days    Oren Binet, MD

## 2021-04-19 NOTE — Progress Notes (Signed)
Secure chat sent to patient's nurse. Fran Lowes, RN VAST

## 2021-04-19 NOTE — Progress Notes (Signed)
Physical Therapy Treatment Patient Details Name: Carolyn Russo MRN: [id number] DOB: [birth year] Today's Date: 04/19/2021    History of Present Illness 70 y.o. female presented 04/16/21 with left-sided weakness that started about 3 days PTA. MRI brain acute ischemic nonhemorrhagic right basal ganglia infarct PMH diabetes, hypertension, hyperlipidemia, ESRD on HD MWF    PT Comments    Patient up in chair on arrival. Able to ambulate 15 ft with +2 min assist with chair brought to pt to rest, and then walked 15 ft a second time. Pt with mild left lean and decr left foot clearance.     Follow Up Recommendations  CIR     Equipment Recommendations  Rolling walker with 5" wheels    Recommendations for Other Services       Precautions / Restrictions Precautions Precautions: Fall    Mobility  Bed Mobility                    Transfers Overall transfer level: Needs assistance Equipment used: Rolling walker (2 wheeled) Transfers: Sit to/from Stand Sit to Stand: Min assist         General transfer comment: vc for hand placement with use of RW; steadying assist due to postural instability  Ambulation/Gait Ambulation/Gait assistance: Min assist;+2 physical assistance Gait Distance (Feet): 15 Feet (seated rest x 4 minutes; 15 ft) Assistive device: Rolling walker (2 wheeled) Gait Pattern/deviations: Step-through pattern;Decreased stride length;Decreased weight shift to right;Decreased dorsiflexion - left         Stairs             Wheelchair Mobility    Modified Rankin (Stroke Patients Only)       Balance Overall balance assessment: Needs assistance Sitting-balance support: Feet supported Sitting balance-Leahy Scale: Fair     Standing balance support: Bilateral upper extremity supported;During functional activity Standing balance-Leahy Scale: Poor                              Cognition Arousal/Alertness: Awake/alert Behavior During  Therapy: Flat affect Overall Cognitive Status: Within Functional Limits for tasks assessed                                 General Comments: A&Ox4; followed multi-step commands; decreased awareness of need for safety.      Exercises      General Comments General comments (skin integrity, edema, etc.): pt stating she did not need a brief to wear for urine incontinence due to having purewick with mesh briefs; attempted to persuade her to use a brief and she refused; pt was incontinent while walking and required change of gown, briefs and purewick at end of session      Pertinent Vitals/Pain Pain Assessment: No/denies pain Faces Pain Scale: No hurt    Home Living                      Prior Function            PT Goals (current goals can now be found in the care plan section) Acute Rehab PT Goals Patient Stated Goal: regain ability to walk Time For Goal Achievement: 05/01/21 Potential to Achieve Goals: Good Progress towards PT goals: Progressing toward goals    Frequency    Min 4X/week      PT Plan Current plan remains appropriate    Co-evaluation  AM-PAC PT "6 Clicks" Mobility   Outcome Measure  Help needed turning from your back to your side while in a flat bed without using bedrails?: A Little Help needed moving from lying on your back to sitting on the side of a flat bed without using bedrails?: A Little Help needed moving to and from a bed to a chair (including a wheelchair)?: A Little Help needed standing up from a chair using your arms (e.g., wheelchair or bedside chair)?: A Little Help needed to walk in hospital room?: Total Help needed climbing 3-5 steps with a railing? : Total 6 Click Score: 14    End of Session Equipment Utilized During Treatment: Gait belt Activity Tolerance: Patient tolerated treatment well Patient left: in chair;with call bell/phone within reach;with chair alarm set Nurse Communication:  Mobility status PT Visit Diagnosis: Hemiplegia and hemiparesis Hemiplegia - Right/Left: Left Hemiplegia - dominant/non-dominant: Non-dominant Hemiplegia - caused by: Cerebral infarction     Time: 0086-7619 PT Time Calculation (min) (ACUTE ONLY): 29 min  Charges:  $Gait Training: 23-37 mins                      Arby Barrette, PT Pager [phone]    Rexanne Mano 04/19/2021, 4:00 PM

## 2021-04-19 NOTE — Progress Notes (Signed)
Inpatient Rehab Admissions Coordinator:   Met with patient at bedside to discuss potential CIR admission. Pt. Stated interest. Will pursue for potential admit pending medical readiness.  Clemens Catholic, [place], [place] Admissions Coordinator  [phone] ([place]) [phone] (office)

## 2021-04-19 NOTE — Evaluation (Signed)
Speech Language Pathology Evaluation Patient Details Name: Carolyn Russo MRN: [id number] DOB: [birth year] Today's Date: 04/19/2021 Time: 7096-2836 SLP Time Calculation (min) (ACUTE ONLY): 31 min  Problem List:  Patient Active Problem List   Diagnosis Date Noted   Acute GI bleeding    External hemorrhoids    CVA (cerebral vascular accident) ([place]) 04/16/2021   ESRD (end stage renal disease) ([place]) 04/16/2021   Hypertensive urgency 03/10/2021   Hilar enlargement    Anemia of chronic disease    Chronic diastolic CHF (congestive heart failure) ([place]) 03/09/2021   CKD (chronic kidney disease), stage V ([place]) 03/08/2021   Diabetic retinopathy ([place]) 03/08/2021   Hyperglycemia due to type 2 diabetes mellitus ([place]) 03/08/2021   Pure hypercholesterolemia 03/08/2021   Anemia in chronic kidney disease 09/24/2020   Uncontrolled type 2 diabetes mellitus with hyperglycemia ([place]) 03/19/2019   Osteopenia 09/04/2018   Migraine 09/04/2018   Asthma 09/04/2018   Pseudophakia of both eyes 07/17/2018   Pseudophakia of left eye 07/03/2018   Open angle with borderline findings and high glaucoma risk in left eye 07/03/2018   Age-related nuclear cataract of right eye 07/03/2018   CAD (coronary artery disease), native coronary artery 04/27/2017   Abnormal nuclear stress test 04/04/2017   Dyspnea on exertion 03/09/2017   Appendicitis    Age-related hypermature cataract of both eyes 12/20/2016   Uterine leiomyoma 11/27/2012   Dyslipidemia (high LDL; low HDL) 11/25/2011   Obesity (BMI 30-39.9) 11/25/2011   Hypertensive disorder 11/21/2011   Type 2 diabetes mellitus ([place]) 11/21/2011   Abnormal EKG 11/21/2011   Past Medical History:  Past Medical History:  Diagnosis Date   Allergy    Anterior chest wall pain    Appendicitis 1965   Asthma    Body mass index 37.0-37.9, adult    Breast pain    Cataract    both eyes   Chronic renal disease, stage II    Dehydration 2014   Deviated septum 1971    Diabetes mellitus    Dyspnea 2014   Extrinsic asthma    WITH ASTHMA ATTACK   Fibroid 1980   GERD (gastroesophageal reflux disease)    Heart murmur    Hx gestational diabetes    Hyperlipidemia    Hypertension 2014   Inguinal hernia 1959   Malaise and fatigue 2014   Nephropathy    Non-IgE mediated allergic asthma 2014   Obesity    Pelvic pain    Pregnancy, high-risk 1985   Tonsillitis 1968   Uterine fibroid 1980   Past Surgical History:  Past Surgical History:  Procedure Laterality Date   [place]   COLONOSCOPY     ESOPHAGOGASTRODUODENOSCOPY (EGD) WITH PROPOFOL N/A 04/18/2021   Procedure: ESOPHAGOGASTRODUODENOSCOPY (EGD) WITH PROPOFOL;  Surgeon: Gatha Mayer, MD;  Location: [facility] ENDOSCOPY;  Service: Endoscopy;  Laterality: N/A;   EYE SURGERY     bilateral cataract    FLEXIBLE SIGMOIDOSCOPY N/A 04/18/2021   Procedure: FLEXIBLE SIGMOIDOSCOPY;  Surgeon: Gatha Mayer, MD;  Location: [facility] ENDOSCOPY;  Service: Endoscopy;  Laterality: N/A;   HERNIA REPAIR  1959   IR FLUORO GUIDE CV LINE RIGHT  03/18/2021   IR US GUIDE VASC ACCESS RIGHT  03/18/2021   LEFT HEART CATH AND CORONARY ANGIOGRAPHY N/A 04/04/2017   Procedure: Left Heart Cath and Coronary Angiography;  Surgeon: Belva Crome, MD;  Location: [place] CV LAB;  Service: Cardiovascular;  Laterality: N/A;   [place], 2004,  2007   RHINOPLASTY  1971   ROTATOR CUFF REPAIR  2003   SURGICAL REPAIR OF HEMORRHAGE  2015   TONSILLECTOMY  1968   HPI:  70 yr old presents with persistent left-sided facial droop slurred speech x 3 days. MRI 2.1 cm acute ischemic nonhemorrhagic right basal ganglia infarct, underlying moderate chronic microvascular ischemic disease. On 6/25 rapis response called due to continued GI bleeding (from rectum) and made NPO. BSE 03/22/21 for SOB and found to have acute on chronic CHF d/t choking on collards/vinegar. Swallow was grossly WFL's- re/thin recommneded. PMH: asthma,  chronic renal disease, DM, GERD, HTN.   Assessment / Plan / Recommendation Clinical Impression  Pt presents with very mild cognitive deficits as assessed using COGNISTAT (see below for additional information).  Pt and husband both feel that she is at her baseline.  Pt was a single point outside of normal range for caluclations, and one item was missed d/t time constraint on test although she arrived at the correct answer independently.  On word recall task pt exhibited mild deficits.  She benefited from cuing (category and multiple choice).  Pt and husband do not have concerns related to these areas.    Pt denies word finiding difficulty and was able to participate in conversation with SLP without any noticeable impairments.    Pt presents with mild dysarthria, but was 100% intelligible in conversation.  Pt does endorse slurred speech.  Her speech is marked by consontant imprecision and occasional cluster reduction.  Pt's speech is consistent with UUMN dysarthria.  Provided education regarding speech strategies verbally and in writing.  Pt may benefit from continued ST to address dysarthria.  COGNISTAT: All subtests are within the average range, except where otherwise specified.  Orientation:  12/12 Attention: 6/8 Comprehension: 5/6 Repetition: 12/12 Naming: 8/8 Construction: not assessed Memory: 8/12, Mild impairment Calculations: 3/4, Mild impairment Similarities: 6/8 Judgment: 4/6     SLP Assessment  SLP Recommendation/Assessment: Patient needs continued Speech Lanaguage Pathology Services SLP Visit Diagnosis: Dysarthria and anarthria (R47.1)    Follow Up Recommendations  Inpatient Rehab;Outpatient SLP;Home health SLP    Frequency and Duration min 2x/week  2 weeks      SLP Evaluation Cognition  Overall Cognitive Status: Within Functional Limits for tasks assessed Orientation Level: Oriented X4 Attention: Focused;Sustained Focused Attention: Appears intact Sustained  Attention: Appears intact Memory: Impaired Memory Impairment: Decreased short term memory Decreased Short Term Memory: Verbal basic Problem Solving: Appears intact Executive Function: Reasoning Reasoning: Appears intact       Comprehension  Auditory Comprehension Overall Auditory Comprehension: Appears within functional limits for tasks assessed Commands: Within Functional Limits Conversation: Simple Visual Recognition/Discrimination Discrimination: Not tested Reading Comprehension Reading Status: Not tested    Expression Expression Primary Mode of Expression: Verbal Verbal Expression Initiation: No impairment Repetition: No impairment Naming: No impairment Pragmatics: No impairment Written Expression Dominant Hand: Right Written Expression: Not tested   Oral / Motor  Oral Motor/Sensory Function Overall Oral Motor/Sensory Function: Mild impairment Facial ROM: Reduced left Facial Symmetry: Abnormal symmetry left Facial Strength: Reduced left Lingual ROM: Reduced left Lingual Symmetry: Abnormal symmetry left Lingual Strength: Reduced Velum: Within Functional Limits Mandible: Within Functional Limits Motor Speech Overall Motor Speech: Impaired Respiration: Within functional limits Phonation: Normal Resonance: Within functional limits Articulation: Impaired Intelligibility: Intelligible Motor Planning: Witnin functional limits Motor Speech Errors: Aware   GO                    Lindalou Soltis E Jearlene Bridwell,  [place], [place] Office: [phone]  04/19/2021, 12:03 PM

## 2021-04-19 NOTE — Evaluation (Signed)
Occupational Therapy Evaluation Patient Details Name: Carolyn Russo MRN: [id number] DOB: [birth year] Today's Date: 04/19/2021    History of Present Illness 70 y.o. female presented 04/16/21 with left-sided weakness that started about 3 days PTA. MRI brain acute ischemic nonhemorrhagic right basal ganglia infarct PMH diabetes, hypertension, hyperlipidemia, ESRD on HD MWF   Clinical Impression   PTA patient was living with her spouse in a private residence and was grossly Mod I with ADLs/IADLs with use of SPC. Reports occasional assist from husband to don footwear. Patient currently functioning below baseline requiring Min guard to Mod A grossly for ADLs with use of SPC secondary to RUE hemiparesis, decreased static/dynamic standing balance with postural instability, and decreased activity tolerance. Patient would benefit from continued acute OT services in prep for safe d/c to next level of care with recommendation for CIR.     Follow Up Recommendations  CIR    Equipment Recommendations  Other (comment) (Defer to next level of care)    Recommendations for Other Services Rehab consult     Precautions / Restrictions Precautions Precautions: Fall Restrictions Weight Bearing Restrictions: No      Mobility Bed Mobility Overal bed mobility: Needs Assistance Bed Mobility: Rolling;Sidelying to Sit Rolling: Supervision Sidelying to sit: Min guard       General bed mobility comments: Supervision A for rolling R. Min guard to elevate trunk with HOB elevated and increased time/effort.    Transfers Overall transfer level: Needs assistance Equipment used: Straight cane Transfers: Sit to/from [facility] Sit to Stand: Min assist Stand pivot transfers: Min assist       General transfer comment: Min A to maintain static/dymanic balance 2/2 postural instability.    Balance Overall balance assessment: Needs assistance Sitting-balance support: Single extremity  supported;Bilateral upper extremity supported;Feet supported Sitting balance-Leahy Scale: Fair     Standing balance support: Bilateral upper extremity supported;During functional activity Standing balance-Leahy Scale: Poor                             ADL either performed or assessed with clinical judgement   ADL Overall ADL's : Needs assistance/impaired                     Lower Body Dressing: Maximal assistance;Sit to/from stand Lower Body Dressing Details (indicate cue type and reason): Max A to don footwear seated EOB. Patient states husband assists PRN with donning footwear. Toilet Transfer: Minimal Print production planner Details (indicate cue type and reason): Simulated with transfer to recliner with increased time/effort and Min A. Postural instability.         Functional mobility during ADLs: Minimal assistance General ADL Comments: Patient limited by RUE weakness, decreased static/dynamic standing balance and decreased safety awareness.     Vision Baseline Vision/History: No visual deficits Vision Assessment?: No apparent visual deficits     Perception     Praxis      Pertinent Vitals/Pain Pain Assessment: Faces Faces Pain Scale: Hurts a little bit Pain Location: R hip bursitis Pain Descriptors / Indicators: Aching;Sore Pain Intervention(s): Monitored during session;Repositioned;Limited activity within patient's tolerance     Hand Dominance Right   Extremity/Trunk Assessment Upper Extremity Assessment Upper Extremity Assessment: LUE deficits/detail LUE Deficits / Details: R hemi; shoulder flex/abd ~50 degrees; painful elbow flexion 2/2 IV site; 3/5 deltoid, 3+/5 bicep, 3/5 tricep, 4/5 gross grasp LUE Sensation: decreased light touch LUE Coordination: decreased fine motor;decreased gross motor  Lower Extremity Assessment Lower Extremity Assessment: Defer to PT evaluation   Cervical / Trunk Assessment Cervical / Trunk Assessment:  Other exceptions Cervical / Trunk Exceptions: decr postural stability in standing   Communication Communication Communication: HOH   Cognition Arousal/Alertness: Awake/alert Behavior During Therapy: Flat affect Overall Cognitive Status: Within Functional Limits for tasks assessed                                 General Comments: A&Ox4; followed multi-step commands; decreased awareness of need for safety.   General Comments  Husband present at bedside.    Exercises     Shoulder Instructions      Home Living Family/patient expects to be discharged to:: Private residence Living Arrangements: Spouse/significant other Available Help at Discharge: Family Type of Home: House Home Access: Stairs to enter [employer] of Steps: 4 Entrance Stairs-Rails: Right Home Layout: Two level;Able to live on main level with bedroom/bathroom     Bathroom Shower/Tub: Occupational psychologist: Standard     Home Equipment: Cane - single point;Bedside commode;Walker - 4 wheels (has BSC over toilet to incr height; rollator is pt's husband that he does not use (uses cane))          Prior Functioning/Environment Level of Independence: Independent with assistive device(s)        Comments: was using cane PTA (had prolonged recent hospitalization with resulting weakness and was receiving HHPT); cannot shower due to port-a-cath in rt upper chest        OT Problem List: Decreased strength;Decreased range of motion;Impaired balance (sitting and/or standing);Decreased coordination;Decreased safety awareness;Impaired UE functional use;Pain      OT Treatment/Interventions: Self-care/ADL training;Therapeutic exercise;Neuromuscular education;Energy conservation;DME and/or AE instruction;Therapeutic activities;Patient/family education;Balance training    OT Goals(Current goals can be found in the care plan section) Acute Rehab OT Goals Patient Stated Goal: regain  ability to walk OT Goal Formulation: With patient Time For Goal Achievement: 05/03/21 Potential to Achieve Goals: Good ADL Goals Pt Will Perform Upper Body Dressing: with set-up;sitting Pt Will Perform Lower Body Dressing: with supervision;sit to/from stand Pt Will Transfer to Toilet: with supervision;ambulating Pt Will Perform Toileting - Clothing Manipulation and hygiene: with supervision;sit to/from stand Pt/caregiver will Perform Home Exercise Program: Increased ROM;Increased strength;Left upper extremity;With written HEP provided;Independently  OT Frequency: Min 2X/week   Barriers to D/C:            Co-evaluation              AM-PAC OT "6 Clicks" Daily Activity     Outcome Measure Help from another person eating meals?: A Little Help from another person taking care of personal grooming?: A Little Help from another person toileting, which includes using toliet, bedpan, or urinal?: A Lot Help from another person bathing (including washing, rinsing, drying)?: A Lot Help from another person to put on and taking off regular upper body clothing?: A Little Help from another person to put on and taking off regular lower body clothing?: A Lot 6 Click Score: 15   End of Session Equipment Utilized During Treatment: Gait belt;Other (comment) [facility]) Nurse Communication: Mobility status  Activity Tolerance: Patient tolerated treatment well Patient left: in chair;with call bell/phone within reach;Other (comment) (SPL present for swallow eval)  OT Visit Diagnosis: Unsteadiness on feet (R26.81);Muscle weakness (generalized) (M62.81);Hemiplegia and hemiparesis Hemiplegia - Right/Left: Left Hemiplegia - dominant/non-dominant: Non-Dominant Hemiplegia - caused by: Cerebral infarction  Time: 8826-6664 OT Time Calculation (min): 19 min Charges:  OT General Charges $OT Visit: 1 Visit OT Evaluation $OT Eval Moderate Complexity: 1 Mod  Taria Castrillo H. OTR/L Supplemental OT,  Department of rehab services [phone]  Gregg Winchell R H. 04/19/2021, 10:59 AM

## 2021-04-19 NOTE — Progress Notes (Signed)
  Speech Language Pathology Treatment: Dysphagia  Patient Details Name: Carolyn Russo MRN: [id number] DOB: 1950/10/25 Today's Date: 04/19/2021 Time: 0768-0881 SLP Time Calculation (min) (ACUTE ONLY): 13 min  Assessment / Plan / Recommendation Clinical Impression  Pt awake and alert and seated upright in chair for swallowing reassessment.  Pt exhibits mild oral dysphagia c/b reduced labial seal and decreased L lingual strength and ROM.  Pt tolerated all consistencies trialed with no clinical s/s of aspiration.  There was trace oral residue on L side of oral cavity with puree.  There was trace to mild residue on L with solids.  Pt was able to use liquid wash independently to clear residuals.  Pt also reports some anterior loss of secretions which she is managing with a washcloth.  Discussed diet preference with pt and family.  She states that meats can sometimes be difficult for her.  Her husband cuts her food at home.  Pt would prefer mechanical soft solids for ease of intake.    At present, pt will be placed on clear diet per GI.  When medically appropriate, pt can advance to mechanical soft solids.     HPI HPI: 70 yr old presents with persistent left-sided facial droop slurred speech x 3 days. MRI 2.1 cm acute ischemic nonhemorrhagic right basal ganglia infarct, underlying moderate chronic microvascular ischemic disease. On 6/25 rapis response called due to continued GI bleeding (from rectum) and made NPO. BSE 03/22/21 for SOB and found to have acute on chronic CHF d/t choking on collards/vinegar. Swallow was grossly WFL's- re/thin recommneded. PMH: asthma, chronic renal disease, DM, GERD, HTN.      SLP Plan  Continue with current plan of care       Recommendations  Diet recommendations: Dysphagia 3 (mechanical soft);Thin liquid (When medically appropriate; Clear liquids only at present, per GI) Liquids provided via: Straw;Cup Medication Administration:  (As tolerated) Supervision: Patient  able to self feed Compensations: Slow rate;Small sips/bites Postural Changes and/or Swallow Maneuvers: Seated upright 90 degrees                Oral Care Recommendations: Oral care BID Follow up Recommendations: Inpatient Rehab;Outpatient SLP;Home health SLP (Continue ST at next leve of care) SLP Visit Diagnosis: Dysphagia, oral phase (R13.11);Dysphagia, unspecified (R13.10) Plan: Continue with current plan of care       [place], [place], [place] Office: [phone] 04/19/2021, 11:50 AM

## 2021-04-19 NOTE — Progress Notes (Addendum)
Progress Note   Subjective  Chief Complaint: GI bleed  Today, the patient tells me that she is doing fairly well.  Her husband is by her bedside and tells me that she has not had another episode of hematochezia since time of procedure yesterday, just had some residual blood which she wiped off.  Overall patient is feeling well and asked how she can avoid constipation in the future when she can eat.  Denies any new complaints or concerns.  Still has not been cleared by speech pathology to drink liquids.   Objective   Vital signs in last 24 hours: Temp:  [97.8 F (36.6 C)-98.8 F (37.1 C)] 98.2 F (36.8 C) (06/27 0700) Pulse Rate:  [79-93] 81 (06/27 0800) Resp:  [14-30] 18 (06/27 0852) BP: (120-191)/(51-96) 133/51 (06/27 0800) SpO2:  [93 %-100 %] 100 % (06/27 0800) Weight:  [83.9 kg] 83.9 kg (06/26 1650) Last BM Date: 04/18/21 General:    AA female in NAD Heart:  Regular rate and rhythm; no murmurs Lungs: Respirations even and unlabored, lungs CTA bilaterally Abdomen:  Soft, nontender and nondistended. Normal bowel sounds. Psych:  Cooperative. Normal mood and affect.  Intake/Output from previous day: 06/26 0701 - 06/27 0700 In: 2643.8 [I.V.:300; Blood:2343.8] Out: -    Lab Results: Recent Labs    04/16/21 1215 04/17/21 0224 04/17/21 1504 04/18/21 1006 04/18/21 1621 04/18/21 2125 04/19/21 0413 04/19/21 0959  WBC 9.0 9.9  --  12.9*  --   --   --   --   HGB 9.0* 8.9*   < > 9.9*   < > 10.5* 9.3* 9.2*  HCT 27.3* 26.1*   < > 29.2*   < > 30.0* 27.6* 27.6*  PLT 282 267  --  233  --   --   --   --    < > = values in this interval not displayed.   BMET Recent Labs    04/17/21 0224 04/18/21 1006 04/19/21 0413  NA 136 136 140  K 3.6 3.9 3.8  CL 97* 98 99  CO2 29 26 28   GLUCOSE 90 101* 87  BUN 16 27* 30*  CREATININE 4.08* 6.06* 6.39*  CALCIUM 8.8* 8.7* 8.7*   LFT Recent Labs    04/17/21 0224  ALBUMIN 3.1*   Studies/Results: CT HEAD WO CONTRAST  Result  Date: 04/18/2021 CLINICAL DATA:  Follow-up stroke. EXAM: CT HEAD WITHOUT CONTRAST TECHNIQUE: Contiguous axial images were obtained from the base of the skull through the vertex without intravenous contrast. COMPARISON:  CT scan April 14, 2021.  MRI of the head April 16, 2021. FINDINGS: Brain: No subdural, epidural, or subarachnoid hemorrhage. No mass effect or midline shift. Ventricles and sulci are unchanged. The known right basal ganglia infarct is more pronounced on CT imaging in the interval seen on series 4, image 17 with decreased attenuation. No associated hemorrhage. White matter changes otherwise stable. Cerebellum, brainstem, and basal cisterns are normal. No other evidence of acute infarct or ischemia. Vascular: Calcified atherosclerosis in the intracranial carotids. Skull: Normal. Negative for fracture or focal lesion. Sinuses/Orbits: No acute finding. Other: No other abnormalities. IMPRESSION: 1. The right basal ganglia infarct is better visualized on today's CT compared to the April 14, 2021 CT due to group decreasing attenuation. No associated hemorrhage identified. No other significant interval changes. Electronically Signed   By: Dorise Bullion III M.D   On: 04/18/2021 13:04      Assessment / Plan:   Assessment: 1.  Acute  GI bleed: In the setting of Plavix load and aspirin after recent CVA, with multiple episodes of bloody stool on 04/17/2021, and opne on 6/26 prior colonoscopy 2019 with no diverticulosis, did have adenomatous polyps removed, EGD and flex sig yesterday with poor prep, but no etiology for bleeding identified, bleeding scan negative 2.  End-stage renal disease on hemodialysis 3.  Recent acute right subcortical CVA 6/24: On Plavix and aspirin  Plan: 1.  Continue twice daily PPI 2.  Serial hemoglobins and transfuse as indicated 3.  Currently patient is unable to complete a bowel prep by mouth and would require NG placement for prep, for now continue to observe hemoglobin and  for any other signs of acute GI bleed 4.  Please await further recommendations Dr. Silverio Decamp later today  Thank you for your kind consultation, we will continue to follow    LOS: 3 days   Levin Erp  04/19/2021, 10:38 AM   Attending physician's note   I have taken an interval history, reviewed the chart and examined the patient. I agree with the Advanced Practitioner's note, impression and recommendations.   Overall feeling better Hemoglobin stable No further episodes of hematochezia  Possible etiology ischemic colitis We will hold off colonoscopy if no further bleeding Clear liquids today, can advance to soft diet tomorrow if no further bleeding  GI will continue to follow along    I have spent 35 minutes of patient care (this includes precharting, chart review, review of results, face-to-face time used for counseling as well as treatment plan and follow-up. The patient was provided an opportunity to ask questions and all were answered. The patient agreed with the plan and demonstrated an understanding of the instructions.  Damaris Hippo , MD [phone]

## 2021-04-19 NOTE — Progress Notes (Signed)
[facility] Progress Note   Subjective:     Seen and examined patient at bedside. Patient's husband also at bedside. No complaints at this time-denies SOB, CP, ABD pain, and N/V/D. Plan for scheduled HD this evening 04/19/21.  Objective Vitals:   04/19/21 0700 04/19/21 0800 04/19/21 0852 04/19/21 1153  BP: (!) 133/51 (!) 133/51  (!) 157/64  Pulse: 92 81  82  Resp: 16 16 18 16   Temp: 98.2 F (36.8 C)   97.9 F (36.6 C)  TempSrc: Axillary   Oral  SpO2: 100% 100%  100%  Weight:      Height:       Physical Exam General: Ill-appearing female; NAD Heart: S1 and S2; No murmurs, gallops, or rubs Lungs: Clear throughout; No wheezing, rales, or rhonchi Abdomen: Soft and non-tender Extremities: No edema BLLE Dialysis Access: R[facility]   Filed Weights   04/16/21 1154 04/18/21 1650  Weight: 83.9 kg 83.9 kg    Intake/Output Summary (Last 24 hours) at 04/19/2021 1556 Last data filed at 04/19/2021 0800 Gross per 24 hour  Intake 2266.25 ml  Output --  Net 2266.25 ml    Additional Objective Labs: Basic Metabolic Panel: Recent Labs  Lab 04/17/21 0224 04/18/21 1006 04/19/21 0413  NA 136 136 140  K 3.6 3.9 3.8  CL 97* 98 99  CO2 29 26 28   GLUCOSE 90 101* 87  BUN 16 27* 30*  CREATININE 4.08* 6.06* 6.39*  CALCIUM 8.8* 8.7* 8.7*  PHOS 3.7  --   --    Liver Function Tests: Recent Labs  Lab 04/14/21 1824 04/17/21 0224  AST 27  --   ALT 21  --   ALKPHOS 59  --   BILITOT 0.7  --   PROT 6.9  --   ALBUMIN 3.4* 3.1*   No results for input(s): LIPASE, AMYLASE in the last 168 hours. CBC: Recent Labs  Lab 04/14/21 1824 04/14/21 1833 04/16/21 1215 04/17/21 0224 04/17/21 1504 04/18/21 1006 04/18/21 1621 04/18/21 2125 04/19/21 0413 04/19/21 0959  WBC 9.9  --  9.0 9.9  --  12.9*  --   --   --   --   NEUTROABS 5.4  --  4.2 3.6  --  6.2  --   --   --   --   HGB 8.8*   < > 9.0* 8.9*   < > 9.9*   < > 10.5* 9.3* 9.2*  HCT 26.7*   < > 27.3* 26.1*   < > 29.2*    < > 30.0* 27.6* 27.6*  MCV 87.5  --  88.1 85.9  --  87.7  --   --   --   --   PLT 265  --  282 267  --  233  --   --   --   --    < > = values in this interval not displayed.   Blood Culture No results found for: SDES, SPECREQUEST, CULT, REPTSTATUS  Cardiac Enzymes: No results for input(s): CKTOTAL, CKMB, CKMBINDEX, TROPONINI in the last 168 hours. CBG: Recent Labs  Lab 04/18/21 2346 04/19/21 0346 04/19/21 0730 04/19/21 1155 04/19/21 1551  GLUCAP 89 85 96 127* 186*   Iron Studies: No results for input(s): IRON, TIBC, TRANSFERRIN, FERRITIN in the last 72 hours. Lab Results  Component Value Date   INR 1.0 04/14/2021   INR 1.0 03/31/2017   Studies/Results: CT HEAD WO CONTRAST  Result Date: 04/18/2021 CLINICAL DATA:  Follow-up stroke. EXAM: CT  HEAD WITHOUT CONTRAST TECHNIQUE: Contiguous axial images were obtained from the base of the skull through the vertex without intravenous contrast. COMPARISON:  CT scan April 14, 2021.  MRI of the head April 16, 2021. FINDINGS: Brain: No subdural, epidural, or subarachnoid hemorrhage. No mass effect or midline shift. Ventricles and sulci are unchanged. The known right basal ganglia infarct is more pronounced on CT imaging in the interval seen on series 4, image 17 with decreased attenuation. No associated hemorrhage. White matter changes otherwise stable. Cerebellum, brainstem, and basal cisterns are normal. No other evidence of acute infarct or ischemia. Vascular: Calcified atherosclerosis in the intracranial carotids. Skull: Normal. Negative for fracture or focal lesion. Sinuses/Orbits: No acute finding. Other: No other abnormalities. IMPRESSION: 1. The right basal ganglia infarct is better visualized on today's CT compared to the April 14, 2021 CT due to group decreasing attenuation. No associated hemorrhage identified. No other significant interval changes. Electronically Signed   By: Dorise Bullion III M.D   On: 04/18/2021 13:04    Medications:   ondansetron (ZOFRAN) IV      arformoterol  15 mcg Nebulization BID   budesonide (PULMICORT) nebulizer solution  0.5 mg Nebulization BID   carvedilol  3.125 mg Oral BID WC   Chlorhexidine Gluconate Cloth  6 each Topical Daily   furosemide  40-80 mg Oral Q M,W,F   hydrocortisone  25 mg Rectal BID   pantoprazole (PROTONIX) IV  40 mg Intravenous Q12H   rosuvastatin  20 mg Oral Daily   sevelamer carbonate  800 mg Oral TID WC    Dialysis Orders: [facility] 4 hrs 180NRe 400/Autoflow 1.5 86.5 kg 2.0K/2.0 Ca TDC -heparin 2000 units IV TIW -Mircera 100 mcg IV q 2 weeks (last dose 04/14/2021 -Hectorol 5 mcg IV TIW  Assessment/Plan: Acute subcortical R CVA-per Primary/Neurology Acute LGIB-H/O hemorrhoidal bleeding in past. new onset 06/25 post clopidogrel load. HGB 9.0 on admission, fell to 7.2. S/P 2 units PRBCS on 04/18/21. Clopidogrel and ASA on hold. No heparin with HD. Per primary.  ESRD -  MWF Next HD this evening 04/19/2021-. DC Heparin-new GIB. Use 4.0 K bath. K+ 3.6.  Hypertension/volume  - BP is labile. She is under EDW. No evidence of volume overload by exam. UF as tolerated   Anemia  - Hgb now 9.2. As noted in # 2. Follow HGB. Transfuse PRN.  Metabolic bone disease -  No on clear liquid diet until cleared by swallow-evaluation. PTH only 110 03/26/21 on 5 mcg Hectorol. Hold VDRA.  PO4 at goal. Renvela 1 G PO TIW when able to eat.  Nutrition - Now on clear liquid diet- Speech therapy consulted-awaiting swallow evaluation.  DTM2- per primary  Tobie Poet, NP [facility] 04/19/2021,3:56 PM  LOS: 3 days

## 2021-04-19 NOTE — Consult Note (Signed)
   [facility] CM Inpatient Consult   04/19/2021  Carolyn Russo September 18, 1951 [id number]  [facility] [ACO] Patient: Medicare CMS DCE  Primary Care Provider:  [name], MD, [facility]  Patient was screened for readmission less than 30 days in [place] Management program. Patient will have the transition of care call conducted by the primary care provider at [facility]. This patient is also in an Embedded practice which has a chronic disease management Embedded Care Management team.  Plan: Notification sent to the [facility] Management to make aware of admission and this writer is following.  Please contact for further questions,  Natividad Brood, RN BSN [facility] Liaison  [phone] business mobile phone Toll free office [phone]  Fax number: [phone] [email or web address]

## 2021-04-20 ENCOUNTER — Encounter (HOSPITAL_COMMUNITY): Payer: Self-pay | Admitting: Physical Medicine and Rehabilitation

## 2021-04-20 ENCOUNTER — Inpatient Hospital Stay (HOSPITAL_COMMUNITY)
Admission: RE | Admit: 2021-04-20 | Discharge: 2021-05-06 | DRG: 981 | Disposition: A | Discharge status: Home Health | Payer: Medicare Other | Source: Intra-hospital | Attending: Physical Medicine and Rehabilitation | Admitting: Physical Medicine and Rehabilitation

## 2021-04-20 ENCOUNTER — Other Ambulatory Visit: Payer: Self-pay

## 2021-04-20 DIAGNOSIS — K219 Gastro-esophageal reflux disease without esophagitis: Secondary | ICD-10-CM | POA: Diagnosis present

## 2021-04-20 DIAGNOSIS — Z794 Long term (current) use of insulin: Secondary | ICD-10-CM | POA: Diagnosis not present

## 2021-04-20 DIAGNOSIS — R109 Unspecified abdominal pain: Secondary | ICD-10-CM | POA: Diagnosis not present

## 2021-04-20 DIAGNOSIS — N186 End stage renal disease: Secondary | ICD-10-CM | POA: Diagnosis present

## 2021-04-20 DIAGNOSIS — Z888 Allergy status to other drugs, medicaments and biological substances status: Secondary | ICD-10-CM

## 2021-04-20 DIAGNOSIS — I10 Essential (primary) hypertension: Secondary | ICD-10-CM

## 2021-04-20 DIAGNOSIS — R2990 Unspecified symptoms and signs involving the nervous system: Secondary | ICD-10-CM | POA: Diagnosis not present

## 2021-04-20 DIAGNOSIS — N2581 Secondary hyperparathyroidism of renal origin: Secondary | ICD-10-CM | POA: Diagnosis present

## 2021-04-20 DIAGNOSIS — D631 Anemia in chronic kidney disease: Secondary | ICD-10-CM | POA: Diagnosis present

## 2021-04-20 DIAGNOSIS — Z9842 Cataract extraction status, left eye: Secondary | ICD-10-CM

## 2021-04-20 DIAGNOSIS — Z7951 Long term (current) use of inhaled steroids: Secondary | ICD-10-CM

## 2021-04-20 DIAGNOSIS — K648 Other hemorrhoids: Secondary | ICD-10-CM | POA: Diagnosis present

## 2021-04-20 DIAGNOSIS — E785 Hyperlipidemia, unspecified: Secondary | ICD-10-CM | POA: Diagnosis present

## 2021-04-20 DIAGNOSIS — M7061 Trochanteric bursitis, right hip: Secondary | ICD-10-CM | POA: Diagnosis not present

## 2021-04-20 DIAGNOSIS — Z881 Allergy status to other antibiotic agents status: Secondary | ICD-10-CM

## 2021-04-20 DIAGNOSIS — R29898 Other symptoms and signs involving the musculoskeletal system: Secondary | ICD-10-CM | POA: Diagnosis not present

## 2021-04-20 DIAGNOSIS — D72825 Bandemia: Secondary | ICD-10-CM | POA: Diagnosis not present

## 2021-04-20 DIAGNOSIS — K922 Gastrointestinal hemorrhage, unspecified: Secondary | ICD-10-CM

## 2021-04-20 DIAGNOSIS — I639 Cerebral infarction, unspecified: Secondary | ICD-10-CM

## 2021-04-20 DIAGNOSIS — I6381 Other cerebral infarction due to occlusion or stenosis of small artery: Secondary | ICD-10-CM | POA: Diagnosis not present

## 2021-04-20 DIAGNOSIS — E1165 Type 2 diabetes mellitus with hyperglycemia: Secondary | ICD-10-CM | POA: Diagnosis not present

## 2021-04-20 DIAGNOSIS — I6521 Occlusion and stenosis of right carotid artery: Secondary | ICD-10-CM | POA: Diagnosis not present

## 2021-04-20 DIAGNOSIS — R41841 Cognitive communication deficit: Secondary | ICD-10-CM | POA: Diagnosis not present

## 2021-04-20 DIAGNOSIS — E669 Obesity, unspecified: Secondary | ICD-10-CM | POA: Diagnosis present

## 2021-04-20 DIAGNOSIS — I69354 Hemiplegia and hemiparesis following cerebral infarction affecting left non-dominant side: Principal | ICD-10-CM

## 2021-04-20 DIAGNOSIS — D62 Acute posthemorrhagic anemia: Secondary | ICD-10-CM | POA: Diagnosis present

## 2021-04-20 DIAGNOSIS — N189 Chronic kidney disease, unspecified: Secondary | ICD-10-CM | POA: Diagnosis not present

## 2021-04-20 DIAGNOSIS — I5032 Chronic diastolic (congestive) heart failure: Secondary | ICD-10-CM | POA: Diagnosis present

## 2021-04-20 DIAGNOSIS — D509 Iron deficiency anemia, unspecified: Secondary | ICD-10-CM | POA: Diagnosis present

## 2021-04-20 DIAGNOSIS — E1169 Type 2 diabetes mellitus with other specified complication: Secondary | ICD-10-CM | POA: Diagnosis not present

## 2021-04-20 DIAGNOSIS — F419 Anxiety disorder, unspecified: Secondary | ICD-10-CM | POA: Diagnosis present

## 2021-04-20 DIAGNOSIS — Z9841 Cataract extraction status, right eye: Secondary | ICD-10-CM

## 2021-04-20 DIAGNOSIS — I509 Heart failure, unspecified: Secondary | ICD-10-CM | POA: Diagnosis not present

## 2021-04-20 DIAGNOSIS — R4189 Other symptoms and signs involving cognitive functions and awareness: Secondary | ICD-10-CM | POA: Diagnosis not present

## 2021-04-20 DIAGNOSIS — R29810 Facial weakness: Secondary | ICD-10-CM | POA: Diagnosis not present

## 2021-04-20 DIAGNOSIS — J45909 Unspecified asthma, uncomplicated: Secondary | ICD-10-CM | POA: Diagnosis present

## 2021-04-20 DIAGNOSIS — N185 Chronic kidney disease, stage 5: Secondary | ICD-10-CM | POA: Diagnosis not present

## 2021-04-20 DIAGNOSIS — Z8673 Personal history of transient ischemic attack (TIA), and cerebral infarction without residual deficits: Secondary | ICD-10-CM | POA: Diagnosis not present

## 2021-04-20 DIAGNOSIS — R1012 Left upper quadrant pain: Secondary | ICD-10-CM | POA: Diagnosis not present

## 2021-04-20 DIAGNOSIS — K5900 Constipation, unspecified: Secondary | ICD-10-CM

## 2021-04-20 DIAGNOSIS — I2510 Atherosclerotic heart disease of native coronary artery without angina pectoris: Secondary | ICD-10-CM | POA: Diagnosis present

## 2021-04-20 DIAGNOSIS — Z91018 Allergy to other foods: Secondary | ICD-10-CM

## 2021-04-20 DIAGNOSIS — I132 Hypertensive heart and chronic kidney disease with heart failure and with stage 5 chronic kidney disease, or end stage renal disease: Secondary | ICD-10-CM | POA: Diagnosis present

## 2021-04-20 DIAGNOSIS — Z6834 Body mass index (BMI) 34.0-34.9, adult: Secondary | ICD-10-CM

## 2021-04-20 DIAGNOSIS — R414 Neurologic neglect syndrome: Secondary | ICD-10-CM | POA: Diagnosis not present

## 2021-04-20 DIAGNOSIS — R5383 Other fatigue: Secondary | ICD-10-CM | POA: Diagnosis not present

## 2021-04-20 DIAGNOSIS — G459 Transient cerebral ischemic attack, unspecified: Secondary | ICD-10-CM | POA: Diagnosis not present

## 2021-04-20 DIAGNOSIS — Z8719 Personal history of other diseases of the digestive system: Secondary | ICD-10-CM | POA: Diagnosis not present

## 2021-04-20 DIAGNOSIS — Z820 Family history of epilepsy and other diseases of the nervous system: Secondary | ICD-10-CM

## 2021-04-20 DIAGNOSIS — R0602 Shortness of breath: Secondary | ICD-10-CM | POA: Diagnosis not present

## 2021-04-20 DIAGNOSIS — E118 Type 2 diabetes mellitus with unspecified complications: Secondary | ICD-10-CM | POA: Diagnosis not present

## 2021-04-20 DIAGNOSIS — M25512 Pain in left shoulder: Secondary | ICD-10-CM | POA: Diagnosis present

## 2021-04-20 DIAGNOSIS — R29712 NIHSS score 12: Secondary | ICD-10-CM | POA: Diagnosis not present

## 2021-04-20 DIAGNOSIS — J690 Pneumonitis due to inhalation of food and vomit: Secondary | ICD-10-CM | POA: Diagnosis not present

## 2021-04-20 DIAGNOSIS — R29818 Other symptoms and signs involving the nervous system: Secondary | ICD-10-CM | POA: Diagnosis not present

## 2021-04-20 DIAGNOSIS — M25531 Pain in right wrist: Secondary | ICD-10-CM | POA: Diagnosis not present

## 2021-04-20 DIAGNOSIS — R451 Restlessness and agitation: Secondary | ICD-10-CM | POA: Diagnosis not present

## 2021-04-20 DIAGNOSIS — Z91041 Radiographic dye allergy status: Secondary | ICD-10-CM

## 2021-04-20 DIAGNOSIS — R4182 Altered mental status, unspecified: Secondary | ICD-10-CM | POA: Diagnosis not present

## 2021-04-20 DIAGNOSIS — R569 Unspecified convulsions: Secondary | ICD-10-CM | POA: Diagnosis present

## 2021-04-20 DIAGNOSIS — Z79899 Other long term (current) drug therapy: Secondary | ICD-10-CM

## 2021-04-20 DIAGNOSIS — Z9181 History of falling: Secondary | ICD-10-CM | POA: Diagnosis not present

## 2021-04-20 DIAGNOSIS — H53451 Other localized visual field defect, right eye: Secondary | ICD-10-CM | POA: Diagnosis not present

## 2021-04-20 DIAGNOSIS — E1122 Type 2 diabetes mellitus with diabetic chronic kidney disease: Secondary | ICD-10-CM | POA: Diagnosis not present

## 2021-04-20 DIAGNOSIS — M7071 Other bursitis of hip, right hip: Secondary | ICD-10-CM | POA: Diagnosis present

## 2021-04-20 DIAGNOSIS — D72829 Elevated white blood cell count, unspecified: Secondary | ICD-10-CM

## 2021-04-20 DIAGNOSIS — Z91010 Allergy to peanuts: Secondary | ICD-10-CM

## 2021-04-20 DIAGNOSIS — Z992 Dependence on renal dialysis: Secondary | ICD-10-CM | POA: Diagnosis not present

## 2021-04-20 DIAGNOSIS — G4700 Insomnia, unspecified: Secondary | ICD-10-CM | POA: Diagnosis not present

## 2021-04-20 DIAGNOSIS — Z7982 Long term (current) use of aspirin: Secondary | ICD-10-CM

## 2021-04-20 DIAGNOSIS — E1129 Type 2 diabetes mellitus with other diabetic kidney complication: Secondary | ICD-10-CM | POA: Diagnosis not present

## 2021-04-20 DIAGNOSIS — I081 Rheumatic disorders of both mitral and tricuspid valves: Secondary | ICD-10-CM | POA: Diagnosis not present

## 2021-04-20 DIAGNOSIS — Z79891 Long term (current) use of opiate analgesic: Secondary | ICD-10-CM | POA: Diagnosis not present

## 2021-04-20 DIAGNOSIS — K5901 Slow transit constipation: Secondary | ICD-10-CM | POA: Diagnosis not present

## 2021-04-20 DIAGNOSIS — R471 Dysarthria and anarthria: Secondary | ICD-10-CM | POA: Diagnosis not present

## 2021-04-20 DIAGNOSIS — R112 Nausea with vomiting, unspecified: Secondary | ICD-10-CM | POA: Diagnosis not present

## 2021-04-20 DIAGNOSIS — M898X9 Other specified disorders of bone, unspecified site: Secondary | ICD-10-CM | POA: Diagnosis present

## 2021-04-20 DIAGNOSIS — I5033 Acute on chronic diastolic (congestive) heart failure: Secondary | ICD-10-CM | POA: Diagnosis not present

## 2021-04-20 DIAGNOSIS — I160 Hypertensive urgency: Secondary | ICD-10-CM | POA: Diagnosis not present

## 2021-04-20 DIAGNOSIS — G629 Polyneuropathy, unspecified: Secondary | ICD-10-CM | POA: Diagnosis not present

## 2021-04-20 DIAGNOSIS — G479 Sleep disorder, unspecified: Secondary | ICD-10-CM | POA: Diagnosis not present

## 2021-04-20 DIAGNOSIS — L299 Pruritus, unspecified: Secondary | ICD-10-CM | POA: Diagnosis present

## 2021-04-20 DIAGNOSIS — R531 Weakness: Secondary | ICD-10-CM | POA: Diagnosis not present

## 2021-04-20 DIAGNOSIS — Z885 Allergy status to narcotic agent status: Secondary | ICD-10-CM

## 2021-04-20 DIAGNOSIS — Z833 Family history of diabetes mellitus: Secondary | ICD-10-CM

## 2021-04-20 DIAGNOSIS — I6623 Occlusion and stenosis of bilateral posterior cerebral arteries: Secondary | ICD-10-CM | POA: Diagnosis not present

## 2021-04-20 DIAGNOSIS — J9811 Atelectasis: Secondary | ICD-10-CM | POA: Diagnosis not present

## 2021-04-20 HISTORY — DX: Gastrointestinal hemorrhage, unspecified: K92.2

## 2021-04-20 HISTORY — DX: Cerebral infarction, unspecified: I63.9

## 2021-04-20 HISTORY — DX: Other symptoms and signs involving cognitive functions and awareness: R41.89

## 2021-04-20 LAB — CBC
HCT: 29.4 % — ABNORMAL LOW (ref 36.0–46.0)
Hemoglobin: 9.8 g/dL — ABNORMAL LOW (ref 12.0–15.0)
MCH: 29.7 pg (ref 26.0–34.0)
MCHC: 33.3 g/dL (ref 30.0–36.0)
MCV: 89.1 fL (ref 80.0–100.0)
Platelets: 246 10*3/uL (ref 150–400)
RBC: 3.3 MIL/uL — ABNORMAL LOW (ref 3.87–5.11)
RDW: 15.8 % — ABNORMAL HIGH (ref 11.5–15.5)
WBC: 13.7 10*3/uL — ABNORMAL HIGH (ref 4.0–10.5)
nRBC: 0.1 % (ref 0.0–0.2)

## 2021-04-20 LAB — HEMOGLOBIN AND HEMATOCRIT, BLOOD
HCT: 28.1 % — ABNORMAL LOW (ref 36.0–46.0)
Hemoglobin: 9.6 g/dL — ABNORMAL LOW (ref 12.0–15.0)

## 2021-04-20 LAB — GLUCOSE, CAPILLARY
Glucose-Capillary: 108 mg/dL — ABNORMAL HIGH (ref 70–99)
Glucose-Capillary: 117 mg/dL — ABNORMAL HIGH (ref 70–99)
Glucose-Capillary: 127 mg/dL — ABNORMAL HIGH (ref 70–99)
Glucose-Capillary: 174 mg/dL — ABNORMAL HIGH (ref 70–99)

## 2021-04-20 MED ORDER — SODIUM CHLORIDE 0.9 % IV SOLN: 100.0000 mL | INTRAVENOUS | Status: DC | PRN

## 2021-04-20 MED ORDER — ACETAMINOPHEN 325 MG PO TABS
650.0000 mg | ORAL_TABLET | ORAL | Status: DC | PRN
  Administered 2021-04-21 – 2021-05-03 (×4): 650 mg via ORAL
  Filled 2021-04-20 (×4): qty 2

## 2021-04-20 MED ORDER — ALPRAZOLAM 0.5 MG PO TABS
0.5000 mg | ORAL_TABLET | Freq: Three times a day (TID) | ORAL | Status: DC | PRN
  Administered 2021-04-28 – 2021-05-04 (×6): 0.5 mg via ORAL
  Filled 2021-04-20: qty 1
  Filled 2021-04-20 (×2): qty 2
  Filled 2021-04-20: qty 1
  Filled 2021-04-20 (×2): qty 2
  Filled 2021-04-20: qty 1
  Filled 2021-04-20: qty 2

## 2021-04-20 MED ORDER — FUROSEMIDE 40 MG PO TABS: 40.0000 mg | ORAL_TABLET | ORAL | Status: DC

## 2021-04-20 MED ORDER — ROSUVASTATIN CALCIUM 20 MG PO TABS
20.0000 mg | ORAL_TABLET | Freq: Every day | ORAL | Status: DC
  Administered 2021-04-21 – 2021-05-05 (×15): 20 mg via ORAL
  Filled 2021-04-20 (×15): qty 1

## 2021-04-20 MED ORDER — HEPARIN SODIUM (PORCINE) 1000 UNIT/ML DIALYSIS
1000.0000 [IU] | INTRAMUSCULAR | Status: DC | PRN
  Filled 2021-04-20: qty 1

## 2021-04-20 MED ORDER — PANTOPRAZOLE SODIUM 40 MG PO TBEC: 40.0000 mg | DELAYED_RELEASE_TABLET | Freq: Every day | ORAL | Status: DC

## 2021-04-20 MED ORDER — HYDROCORTISONE (PERIANAL) 2.5 % EX CREA
1.0000 "application " | TOPICAL_CREAM | Freq: Two times a day (BID) | CUTANEOUS | Status: DC | PRN
  Administered 2021-04-22: 1 via RECTAL
  Filled 2021-04-20: qty 28.35

## 2021-04-20 MED ORDER — CARVEDILOL 3.125 MG PO TABS
3.1250 mg | ORAL_TABLET | Freq: Two times a day (BID) | ORAL | Status: DC
  Administered 2021-04-20 – 2021-04-26 (×10): 3.125 mg via ORAL
  Filled 2021-04-20 (×10): qty 1

## 2021-04-20 MED ORDER — LIDOCAINE-PRILOCAINE 2.5-2.5 % EX CREA: 1.0000 "application " | TOPICAL_CREAM | CUTANEOUS | Status: DC | PRN

## 2021-04-20 MED ORDER — ACETAMINOPHEN 160 MG/5ML PO SOLN
650.0000 mg | ORAL | Status: DC | PRN
  Administered 2021-04-23: 650 mg
  Filled 2021-04-20: qty 20.3

## 2021-04-20 MED ORDER — LIDOCAINE HCL (PF) 1 % IJ SOLN: 5.0000 mL | INTRAMUSCULAR | Status: DC | PRN

## 2021-04-20 MED ORDER — BUDESONIDE 0.5 MG/2ML IN SUSP
0.5000 mg | Freq: Two times a day (BID) | RESPIRATORY_TRACT | Status: DC
  Administered 2021-04-20 – 2021-05-04 (×24): 0.5 mg via RESPIRATORY_TRACT
  Filled 2021-04-20 (×39): qty 2

## 2021-04-20 MED ORDER — DOCUSATE SODIUM 100 MG PO CAPS
100.0000 mg | ORAL_CAPSULE | Freq: Every day | ORAL | Status: DC | PRN
  Administered 2021-04-26 – 2021-05-04 (×5): 100 mg via ORAL
  Filled 2021-04-20 (×5): qty 1

## 2021-04-20 MED ORDER — HYDROCORTISONE ACETATE 25 MG RE SUPP
25.0000 mg | Freq: Two times a day (BID) | RECTAL | Status: DC
  Administered 2021-04-20 – 2021-05-05 (×26): 25 mg via RECTAL
  Filled 2021-04-20 (×30): qty 1

## 2021-04-20 MED ORDER — LIDOCAINE-PRILOCAINE 2.5-2.5 % EX CREA
1.0000 "application " | TOPICAL_CREAM | CUTANEOUS | Status: DC | PRN
  Filled 2021-04-20: qty 5

## 2021-04-20 MED ORDER — ONDANSETRON HCL 4 MG/2ML IJ SOLN
INTRAMUSCULAR | Status: AC
  Filled 2021-04-20: qty 2

## 2021-04-20 MED ORDER — SEVELAMER CARBONATE 800 MG PO TABS
800.0000 mg | ORAL_TABLET | Freq: Three times a day (TID) | ORAL | Status: DC
  Administered 2021-04-20 – 2021-04-23 (×10): 800 mg via ORAL
  Filled 2021-04-20 (×11): qty 1

## 2021-04-20 MED ORDER — FLUTICASONE PROPIONATE 50 MCG/ACT NA SUSP
2.0000 | Freq: Every day | NASAL | Status: DC | PRN
  Filled 2021-04-20: qty 16

## 2021-04-20 MED ORDER — ACETAMINOPHEN 650 MG RE SUPP: 650.0000 mg | RECTAL | Status: DC | PRN

## 2021-04-20 MED ORDER — HEPARIN SODIUM (PORCINE) 1000 UNIT/ML DIALYSIS: 1000.0000 [IU] | INTRAMUSCULAR | Status: DC | PRN

## 2021-04-20 MED ORDER — LIDOCAINE HCL (PF) 1 % IJ SOLN
5.0000 mL | INTRAMUSCULAR | Status: DC | PRN
  Filled 2021-04-20: qty 5

## 2021-04-20 MED ORDER — SENNOSIDES-DOCUSATE SODIUM 8.6-50 MG PO TABS: 1.0000 | ORAL_TABLET | Freq: Every evening | ORAL | Status: DC | PRN

## 2021-04-20 MED ORDER — FUROSEMIDE 40 MG PO TABS
40.0000 mg | ORAL_TABLET | ORAL | Status: DC
  Administered 2021-04-21 – 2021-05-05 (×7): 40 mg via ORAL
  Filled 2021-04-20 (×8): qty 1

## 2021-04-20 MED ORDER — HYDROCORTISONE (PERIANAL) 2.5 % EX CREA: 1.0000 "application " | TOPICAL_CREAM | Freq: Two times a day (BID) | CUTANEOUS | 1 refills | Status: DC | PRN

## 2021-04-20 MED ORDER — ALTEPLASE 2 MG IJ SOLR: 2.0000 mg | Freq: Once | INTRAMUSCULAR | Status: DC | PRN

## 2021-04-20 MED ORDER — PENTAFLUOROPROP-TETRAFLUOROETH EX AERO: 1.0000 "application " | INHALATION_SPRAY | CUTANEOUS | Status: DC | PRN

## 2021-04-20 MED ORDER — ALBUTEROL SULFATE (2.5 MG/3ML) 0.083% IN NEBU
2.5000 mg | INHALATION_SOLUTION | RESPIRATORY_TRACT | Status: DC | PRN
  Administered 2021-04-29 – 2021-05-04 (×2): 2.5 mg via RESPIRATORY_TRACT
  Filled 2021-04-20: qty 3

## 2021-04-20 MED ORDER — ARFORMOTEROL TARTRATE 15 MCG/2ML IN NEBU
15.0000 ug | INHALATION_SOLUTION | Freq: Two times a day (BID) | RESPIRATORY_TRACT | Status: DC
  Administered 2021-04-20 – 2021-05-04 (×25): 15 ug via RESPIRATORY_TRACT
  Filled 2021-04-20 (×36): qty 2

## 2021-04-20 MED ORDER — TRAMADOL HCL 50 MG PO TABS
50.0000 mg | ORAL_TABLET | Freq: Two times a day (BID) | ORAL | Status: DC | PRN
  Administered 2021-04-21 – 2021-05-04 (×11): 50 mg via ORAL
  Filled 2021-04-20 (×11): qty 1

## 2021-04-20 MED ORDER — PANTOPRAZOLE SODIUM 40 MG PO TBEC
40.0000 mg | DELAYED_RELEASE_TABLET | Freq: Two times a day (BID) | ORAL | Status: DC
  Administered 2021-04-20 – 2021-05-05 (×31): 40 mg via ORAL
  Filled 2021-04-20 (×31): qty 1

## 2021-04-20 NOTE — Progress Notes (Signed)
Inpatient Rehab Admissions Coordinator:   I have a CIR bed for this pt. And will plan to admit her today. RN may call report to [phone] after 12pm today.   Clemens Catholic, [place], [place] Admissions Coordinator  [phone] ([place]) [phone] (office)

## 2021-04-20 NOTE — H&P (Signed)
Physical Medicine and Rehabilitation Admission H&P     HPI:Carolyn Russo is a 70 year old right-handed female with history significant for hypertension, obesity with BMI 34.31, hyperlipidemia, newly diagnosed end-stage renal disease on hemodialysis Monday Wednesday Friday, diabetes mellitus, CAD maintained on aspirin, iron deficiency anemia and asthma.  Patient with latest admission 03/09/2021 to 03/24/2021 for acute on chronic diastolic congestive heart failure exacerbation.  Per chart review patient lives with spouse.  Two-level home bed and bath main level 4 steps to entry.  Independent with cane prior to admission.  Presented 04/16/2021 with left-sided weakness x3 days and slurred speech with bouts of vomiting.  It was noted patient had initially presented to the ER couple days prior to the latest admission with complaints of left-sided weakness but left without being formally evaluated.  Cranial CT scan showed no acute abnormalities.  Patient did not receive tPA.  MRI of the brain showed a 2.1 cm acute ischemic nonhemorrhagic right basal ganglia infarction.  MRA angiogram of head and neck grossly negative no large vessel occlusion or high-grade stenosis.  Most recent echocardiogram with ejection fraction of 76% grade 2 diastolic dysfunction.  Admission chemistries hemoglobin 8.8-9.0, chemistries unremarkable sent creatinine 3.58, hemoglobin A1c 5.2.  Hospital course gastroenterology Dr. Carlean Purl consulted 04/18/2021 after patient had multiple large bloody bowel movements with clots.  Underwent upper endoscopy 04/18/2021 showing normal esophagus and stomach and the cardia and gastric fundus normal on retroflexion and also underwent flexible sigmoidoscopy showing hemorrhoids found on perianal exam external/internal hemorrhoids.  Patient had initially been placed on aspirin and Plavix for CVA prophylaxis held due to suspected lower GI bleed awaiting plan on resuming anticoagulation.  Follow-up renal services  with hemodialysis ongoing as directed.  Currently on a soft diet advanced as tolerated.  Due to patient decreased functional ability left side weakness was admitted for a comprehensive rehab program.  Review of Systems  Constitutional:  Positive for malaise/fatigue. Negative for chills and fever.  HENT:  Negative for hearing loss.   Eyes:  Negative for blurred vision and double vision.  Respiratory:  Negative for cough.        Shortness of breath on exertion  Cardiovascular:  Negative for chest pain, palpitations and leg swelling.  Gastrointestinal:  Positive for blood in stool and vomiting.       GERD  Genitourinary:  Negative for dysuria, flank pain and hematuria.  Musculoskeletal:  Positive for joint pain and myalgias.  Skin:  Negative for rash.  Neurological:  Positive for speech change and weakness.  All other systems reviewed and are negative. Past Medical History:  Diagnosis Date   Allergy    Anterior chest wall pain    Appendicitis 1965   Asthma    Body mass index 37.0-37.9, adult    Breast pain    Cataract    both eyes   Chronic renal disease, stage II    Dehydration 2014   Deviated septum 1971   Diabetes mellitus    Dyspnea 2014   Extrinsic asthma    WITH ASTHMA ATTACK   Fibroid 1980   GERD (gastroesophageal reflux disease)    Heart murmur    Hx gestational diabetes    Hyperlipidemia    Hypertension 2014   Inguinal hernia 1959   Malaise and fatigue 2014   Nephropathy    Non-IgE mediated allergic asthma 2014   Obesity    Pelvic pain    Pregnancy, high-risk 1985   Tonsillitis 1968   Uterine fibroid 1980  Past Surgical History:  Procedure Laterality Date   APPENDECTOMY  1959   CESAREAN SECTION  1985   COLONOSCOPY     ESOPHAGOGASTRODUODENOSCOPY (EGD) WITH PROPOFOL N/A 04/18/2021   Procedure: ESOPHAGOGASTRODUODENOSCOPY (EGD) WITH PROPOFOL;  Surgeon: Gatha Mayer, MD;  Location: [facility];  Service: Endoscopy;  Laterality: N/A;   EYE SURGERY      bilateral cataract    FLEXIBLE SIGMOIDOSCOPY N/A 04/18/2021   Procedure: FLEXIBLE SIGMOIDOSCOPY;  Surgeon: Gatha Mayer, MD;  Location: [facility];  Service: Endoscopy;  Laterality: N/A;   HERNIA REPAIR  1959   IR FLUORO GUIDE CV LINE RIGHT  03/18/2021   IR US GUIDE VASC ACCESS RIGHT  03/18/2021   LEFT HEART CATH AND CORONARY ANGIOGRAPHY N/A 04/04/2017   Procedure: Left Heart Cath and Coronary Angiography;  Surgeon: Belva Crome, MD;  Location: [facility];  Service: Cardiovascular;  Laterality: N/A;   MYOMECTOMY  1980, 2004, 2007   RHINOPLASTY  1971   ROTATOR CUFF REPAIR  2003   SURGICAL REPAIR OF HEMORRHAGE  2015   TONSILLECTOMY  1968   Family History  Problem Relation Age of Onset   Cancer Mother        abdominal melamona   Psoriasis Mother    Alzheimer's disease Father    Cancer Cousin        colon    Diabetes Cousin    Diabetes Maternal Aunt    Colon cancer Neg Hx    Colon polyps Neg Hx    Esophageal cancer Neg Hx    Rectal cancer Neg Hx    Stomach cancer Neg Hx    Breast cancer Neg Hx    Social History:  reports that she has never smoked. She has never used smokeless tobacco. She reports that she does not drink alcohol and does not use drugs. Allergies:  Allergies  Allergen Reactions   Food Anaphylaxis    Peanuts; Almonds   Other Shortness Of Breath    Peanuts; Almonds   Wheat Bran Shortness Of Breath   Statins Itching and Other (See Comments)    Generalized aches   Iodine Other (See Comments)    The patient denied this allergy to me on 03/16/2021   Shellfish Allergy Other (See Comments)    Mouth gets raw   Sitagliptin     Other reaction(s): Unknown   Tetracycline Other (See Comments)    Raw mouth   Tetracyclines & Related Itching   Contrast Media [Iodinated Diagnostic Agents] Rash    Happened during CT scan over 30 years ago   Medications Prior to Admission  Medication Sig Dispense Refill   albuterol (PROVENTIL) (2.5 MG/3ML) 0.083% nebulizer  solution Take 3 mLs (2.5 mg total) by nebulization every 4 (four) hours as needed for wheezing or shortness of breath. 75 mL 2   albuterol (VENTOLIN HFA) 108 (90 Base) MCG/ACT inhaler Inhale 2 puffs into the lungs every 4 (four) hours as needed for wheezing or shortness of breath. 18 g 3   aspirin 81 MG tablet Take 81 mg by mouth daily.     b complex vitamins capsule Take 1 capsule by mouth daily.     BESIVANCE 0.6 % SUSP Place 1 drop into both eyes See admin instructions. After  eye injections     Caraway Oil-Levomenthol (FDGARD PO) Take 1 tablet by mouth 2 (two) times daily as needed (nausea).     carvedilol (COREG) 3.125 MG tablet Take 1 tablet (3.125 mg total) by mouth  2 (two) times daily with a meal. 60 tablet 0   Cholecalciferol (VITAMIN D3 PO) Take 2,000 Units by mouth daily.     Continuous Blood Gluc Receiver (DEXCOM G6 RECEIVER) DEVI Use as directed to check blood sugars 3 times per day dx: e11.65 (Patient taking differently: 1 each by Other route See admin instructions. Use as directed to check blood sugars 3 times per day dx: e11.65) 1 each 1   Continuous Blood Gluc Sensor (DEXCOM G6 SENSOR) MISC Use as directed to check blood sugars 3 times per day dx: e11.65 (Patient taking differently: 1 each by Other route See admin instructions. Use as directed to check blood sugars 3 times per day dx: e11.65) 3 each 2   Continuous Blood Gluc Transmit (DEXCOM G6 TRANSMITTER) MISC Use as directed to check blood sugars 3 times per day dx: e11.65 (Patient taking differently: 1 each by Other route See admin instructions. Use as directed to check blood sugars 3 times per day dx: e11.65) 1 each 1   docusate sodium (COLACE) 100 MG capsule Take 100 mg by mouth daily as needed for mild constipation.     fluticasone (FLONASE) 50 MCG/ACT nasal spray Place 2 sprays into the nose daily as needed for allergies.      furosemide (LASIX) 40 MG tablet Take 1-2 tablets (40-80 mg total) by mouth every Monday, Wednesday, and  Friday. 80 mg twice a week. 40 mg all other days 30 tablet    hydrocortisone (ANUSOL-HC) 2.5 % rectal cream Place 1 application rectally 2 (two) times daily. 30 g 1   insulin aspart protamine - aspart (NOVOLOG MIX 70/30 FLEXPEN) (70-30) 100 UNIT/ML FlexPen Inject 1 mL (100 Units total) into the skin 2 (two) times daily with a meal. 20 units in the morning and 15 units at night (Patient taking differently: Inject 15-20 Units into the skin 2 (two) times daily with a meal. 20 units in the morning and 15 units at night)     magnesium oxide (MAG-OX) 400 (241.3 Mg) MG tablet Take 400 mg by mouth daily.     ondansetron (ZOFRAN) 4 MG tablet Take 1 tablet (4 mg total) by mouth every 6 (six) hours as needed for nausea or vomiting. 90 tablet 1   OneTouch Delica Lancets 76E MISC Use as directed to check blood sugars before breakfast and dinner dx: e11.65 (Patient taking differently: 1 each by Other route See admin instructions. Use as directed to check blood sugars before breakfast and dinner dx: e11.65) 100 each 11   rosuvastatin (CRESTOR) 20 MG tablet Take 1 tablet (20 mg total) by mouth daily. 90 tablet 3   sevelamer carbonate (RENVELA) 800 MG tablet Take 1 tablet (800 mg total) by mouth 3 (three) times daily with meals. 90 tablet 0   SYMBICORT 160-4.5 MCG/ACT inhaler Inhale 2 puffs into the lungs 2 (two) times daily. 10.2 g 5   traMADol (ULTRAM) 50 MG tablet Take 1 tablet (50 mg total) by mouth every 12 (twelve) hours as needed for severe pain. 30 tablet 0   vitamin C (ASCORBIC ACID) 500 MG tablet Take 500 mg by mouth daily.      Drug Regimen Review Drug regimen was reviewed and remains appropriate with no significant issues identified  Home: Home Living Family/patient expects to be discharged to:: Private residence Living Arrangements: Spouse/significant other Available Help at Discharge: Family Type of Home: House Home Access: Stairs to enter [employer] of Steps: 4 Entrance  Stairs-Rails: Right Home Layout: Two level,  Able to live on main level with bedroom/bathroom Bathroom Shower/Tub: Multimedia programmer: Standard Home Equipment: Cane - single point, Bedside commode, Walker - 4 wheels (has BSC over toilet to incr height; rollator is pt's husband that he does not use (uses cane))  Lives With: Spouse   Functional History: Prior Function Level of Independence: Independent with assistive device(s) Comments: was using cane PTA (had prolonged recent hospitalization with resulting weakness and was receiving HHPT); cannot shower due to port-a-cath in rt upper chest  Functional Status:  Mobility: Bed Mobility Overal bed mobility: Needs Assistance Bed Mobility: Rolling, Sidelying to Sit Rolling: Supervision Sidelying to sit: Min guard General bed mobility comments: Supervision A for rolling R. Min guard to elevate trunk with HOB elevated and increased time/effort. Transfers Overall transfer level: Needs assistance Equipment used: Rolling walker (2 wheeled) Transfers: Sit to/from Stand Sit to Stand: Min assist Stand pivot transfers: Min assist General transfer comment: vc for hand placement with use of RW; steadying assist due to postural instability Ambulation/Gait Ambulation/Gait assistance: Min assist, +2 physical assistance Gait Distance (Feet): 15 Feet (seated rest x 4 minutes; 15 ft) Assistive device: Rolling walker (2 wheeled) Gait Pattern/deviations: Step-through pattern, Decreased stride length, Decreased weight shift to right, Decreased dorsiflexion - left General Gait Details: unable to safely with 1 person and lines    ADL: ADL Overall ADL's : Needs assistance/impaired Lower Body Dressing: Maximal assistance, Sit to/from stand Lower Body Dressing Details (indicate cue type and reason): Max A to don footwear seated EOB. Patient states husband assists PRN with donning footwear. Toilet Transfer: Minimal Print production planner  Details (indicate cue type and reason): Simulated with transfer to recliner with increased time/effort and Min A. Postural instability. Functional mobility during ADLs: Minimal assistance General ADL Comments: Patient limited by RUE weakness, decreased static/dynamic standing balance and decreased safety awareness.  Cognition: Cognition Overall Cognitive Status: Within Functional Limits for tasks assessed Orientation Level: Oriented X4 Attention: Focused, Sustained Focused Attention: Appears intact Sustained Attention: Appears intact Memory: Impaired Memory Impairment: Decreased short term memory Decreased Short Term Memory: Verbal basic Problem Solving: Appears intact Executive Function: Reasoning Reasoning: Appears intact Cognition Arousal/Alertness: Awake/alert Behavior During Therapy: Flat affect Overall Cognitive Status: Within Functional Limits for tasks assessed General Comments: A&Ox4; followed multi-step commands; decreased awareness of need for safety.  Physical Exam: Blood pressure (!) 150/76, pulse 85, temperature 98 F (36.7 C), temperature source Oral, resp. rate 20, height 5\' 2"  (1.575 m), weight 85.1 kg, SpO2 98 %. Physical Exam Constitutional:      General: She is not in acute distress.    Appearance: She is obese.  HENT:     Head: Normocephalic.     Right Ear: External ear normal.     Left Ear: External ear normal.     Nose: Nose normal.  Eyes:     Extraocular Movements: Extraocular movements intact.     Conjunctiva/sclera: Conjunctivae normal.     Pupils: Pupils are equal, round, and reactive to light.  Cardiovascular:     Rate and Rhythm: Normal rate and regular rhythm.     Heart sounds: No murmur heard.   No gallop.  Pulmonary:     Effort: Pulmonary effort is normal. No respiratory distress.     Breath sounds: No wheezing or rales.  Abdominal:     General: Bowel sounds are normal. There is no distension.     Palpations: Abdomen is soft.      Tenderness: There is no abdominal tenderness.  Musculoskeletal:        General: No swelling.     Cervical back: Normal range of motion.     Comments: Mild tenderness left shoulder with ER/IR today  Skin:    General: Skin is warm and dry.  Neurological:     Comments: Patient is alert.  No acute distress.  Makes eye contact with examiner.  Provides name and age.  Follows commands.  Fair insight and awareness. Provided biographical information. Normal language, speech slightly slurred. Mild left central 7. LUE 2 to 2+/5 deltoid, 3/5 biceps, triceps and 3+/5 wrist and HI. LLE 3-4/5 prox to distal. RUE and RLE 4-5/5 prox to distal. No sensory deficits. No abnl tone.   Psychiatric:        Mood and Affect: Mood normal.        Behavior: Behavior normal.    Results for orders placed or performed during the hospital encounter of 04/16/21 (from the past 48 hour(s))  Glucose, capillary     Status: None   Collection Time: 04/18/21  9:20 AM  Result Value Ref Range   Glucose-Capillary 95 70 - 99 mg/dL    Comment: Glucose reference range applies only to samples taken after fasting for at least 8 hours.  CBC with Differential/Platelet     Status: Abnormal   Collection Time: 04/18/21 10:06 AM  Result Value Ref Range   WBC 12.9 (H) 4.0 - 10.5 K/uL   RBC 3.33 (L) 3.87 - 5.11 MIL/uL   Hemoglobin 9.9 (L) 12.0 - 15.0 g/dL    Comment: REPEATED TO VERIFY POST TRANSFUSION SPECIMEN    HCT 29.2 (L) 36.0 - 46.0 %   MCV 87.7 80.0 - 100.0 fL   MCH 29.7 26.0 - 34.0 pg   MCHC 33.9 30.0 - 36.0 g/dL   RDW 15.0 11.5 - 15.5 %   Platelets 233 150 - 400 K/uL   nRBC 0.0 0.0 - 0.2 %   Neutrophils Relative % 48 %   Neutro Abs 6.2 1.7 - 7.7 K/uL   Lymphocytes Relative 38 %   Lymphs Abs 4.9 (H) 0.7 - 4.0 K/uL   Monocytes Relative 12 %   Monocytes Absolute 1.5 (H) 0.1 - 1.0 K/uL   Eosinophils Relative 2 %   Eosinophils Absolute 0.2 0.0 - 0.5 K/uL   Basophils Relative 0 %   Basophils Absolute 0.0 0.0 - 0.1 K/uL    Immature Granulocytes 0 %   Abs Immature Granulocytes 0.05 0.00 - 0.07 K/uL    Comment: Performed at [facility], [address], [place], [place] [address]  Basic metabolic panel     Status: Abnormal   Collection Time: 04/18/21 10:06 AM  Result Value Ref Range   Sodium 136 135 - 145 mmol/L   Potassium 3.9 3.5 - 5.1 mmol/L   Chloride 98 98 - 111 mmol/L   CO2 26 22 - 32 mmol/L   Glucose, Bld 101 (H) 70 - 99 mg/dL    Comment: Glucose reference range applies only to samples taken after fasting for at least 8 hours.   BUN 27 (H) 8 - 23 mg/dL   Creatinine, Ser 6.06 (H) 0.44 - 1.00 mg/dL   Calcium 8.7 (L) 8.9 - 10.3 mg/dL   GFR, Estimated 7 (L) >60 mL/min    Comment: (NOTE) Calculated using the CKD-EPI Creatinine Equation (2021)    Anion gap 12 5 - 15    Comment: Performed at [facility] [address] [place], [place] [address]  Glucose, capillary     Status: Abnormal   Collection Time: 04/18/21 11:43 AM  Result Value Ref Range   Glucose-Capillary 100 (H) 70 - 99 mg/dL    Comment: Glucose reference range applies only to samples taken after fasting for at least 8 hours.  Hemoglobin and hematocrit, blood     Status: Abnormal   Collection Time: 04/18/21  4:21 PM  Result Value Ref Range   Hemoglobin 10.2 (L) 12.0 - 15.0 g/dL   HCT 30.0 (L) 36.0 - 46.0 %    Comment: Performed at [facility], [place] [address] [place], [name] [address]  Glucose, capillary     Status: Abnormal   Collection Time: 04/18/21  4:28 PM  Result Value Ref Range   Glucose-Capillary 114 (H) 70 - 99 mg/dL    Comment: Glucose reference range applies only to samples taken after fasting for at least 8 hours.  Glucose, capillary     Status: Abnormal   Collection Time: 04/18/21  9:00 PM  Result Value Ref Range   Glucose-Capillary 108 (H) 70 - 99 mg/dL    Comment: Glucose reference range applies only to samples taken after fasting for at least 8 hours.  Hemoglobin and hematocrit, blood     Status:  Abnormal   Collection Time: 04/18/21  9:25 PM  Result Value Ref Range   Hemoglobin 10.5 (L) 12.0 - 15.0 g/dL    Comment: REPEATED TO VERIFY   HCT 30.0 (L) 36.0 - 46.0 %    Comment: Performed at [facility] [address] [place], [name] [address]  Glucose, capillary     Status: None   Collection Time: 04/18/21 11:46 PM  Result Value Ref Range   Glucose-Capillary 89 70 - 99 mg/dL    Comment: Glucose reference range applies only to samples taken after fasting for at least 8 hours.  Glucose, capillary     Status: None   Collection Time: 04/19/21  3:46 AM  Result Value Ref Range   Glucose-Capillary 85 70 - 99 mg/dL    Comment: Glucose reference range applies only to samples taken after fasting for at least 8 hours.  Hemoglobin and hematocrit, blood     Status: Abnormal   Collection Time: 04/19/21  4:13 AM  Result Value Ref Range   Hemoglobin 9.3 (L) 12.0 - 15.0 g/dL   HCT 27.6 (L) 36.0 - 46.0 %    Comment: Performed at [facility] [address], [place], [place] [address]  Basic metabolic panel     Status: Abnormal   Collection Time: 04/19/21  4:13 AM  Result Value Ref Range   Sodium 140 135 - 145 mmol/L   Potassium 3.8 3.5 - 5.1 mmol/L   Chloride 99 98 - 111 mmol/L   CO2 28 22 - 32 mmol/L   Glucose, Bld 87 70 - 99 mg/dL    Comment: Glucose reference range applies only to samples taken after fasting for at least 8 hours.   BUN 30 (H) 8 - 23 mg/dL   Creatinine, Ser 6.39 (H) 0.44 - 1.00 mg/dL   Calcium 8.7 (L) 8.9 - 10.3 mg/dL   GFR, Estimated 6 (L) >60 mL/min    Comment: (NOTE) Calculated using the CKD-EPI Creatinine Equation (2021)    Anion gap 13 5 - 15    Comment: Performed at [facility] [address], [place], [name] [address]  Glucose, capillary     Status: None   Collection Time:  04/19/21  7:30 AM  Result Value Ref Range   Glucose-Capillary 96 70 - 99 mg/dL    Comment: Glucose reference range applies only to samples taken after fasting for  at least 8 hours.  Hemoglobin and hematocrit, blood     Status: Abnormal   Collection Time: 04/19/21  9:59 AM  Result Value Ref Range   Hemoglobin 9.2 (L) 12.0 - 15.0 g/dL   HCT 27.6 (L) 36.0 - 46.0 %    Comment: Performed at [facility] [address] [place], [name] [address]  Glucose, capillary     Status: Abnormal   Collection Time: 04/19/21 11:55 AM  Result Value Ref Range   Glucose-Capillary 127 (H) 70 - 99 mg/dL    Comment: Glucose reference range applies only to samples taken after fasting for at least 8 hours.  Hemoglobin and hematocrit, blood     Status: Abnormal   Collection Time: 04/19/21  3:25 PM  Result Value Ref Range   Hemoglobin 9.6 (L) 12.0 - 15.0 g/dL   HCT 28.3 (L) 36.0 - 46.0 %    Comment: Performed at [facility], [place] [address] [place], [name] [address]  Glucose, capillary     Status: Abnormal   Collection Time: 04/19/21  3:51 PM  Result Value Ref Range   Glucose-Capillary 186 (H) 70 - 99 mg/dL    Comment: Glucose reference range applies only to samples taken after fasting for at least 8 hours.  Glucose, capillary     Status: Abnormal   Collection Time: 04/19/21  8:33 PM  Result Value Ref Range   Glucose-Capillary 195 (H) 70 - 99 mg/dL    Comment: Glucose reference range applies only to samples taken after fasting for at least 8 hours.  Hemoglobin and hematocrit, blood     Status: Abnormal   Collection Time: 04/19/21  9:33 PM  Result Value Ref Range   Hemoglobin 9.4 (L) 12.0 - 15.0 g/dL   HCT 28.3 (L) 36.0 - 46.0 %    Comment: Performed at [facility], [place] [address], [place], [name] [address]  Glucose, capillary     Status: Abnormal   Collection Time: 04/20/21  1:17 AM  Result Value Ref Range   Glucose-Capillary 108 (H) 70 - 99 mg/dL    Comment: Glucose reference range applies only to samples taken after fasting for at least 8 hours.  Glucose, capillary     Status: Abnormal   Collection Time: 04/20/21  3:53 AM  Result Value  Ref Range   Glucose-Capillary 117 (H) 70 - 99 mg/dL    Comment: Glucose reference range applies only to samples taken after fasting for at least 8 hours.  Hemoglobin and hematocrit, blood     Status: Abnormal   Collection Time: 04/20/21  4:05 AM  Result Value Ref Range   Hemoglobin 9.6 (L) 12.0 - 15.0 g/dL   HCT 28.1 (L) 36.0 - 46.0 %    Comment: Performed at [facility] [address] [place], [place] [address]   CT HEAD WO CONTRAST  Result Date: 04/18/2021 CLINICAL DATA:  Follow-up stroke. EXAM: CT HEAD WITHOUT CONTRAST TECHNIQUE: Contiguous axial images were obtained from the base of the skull through the vertex without intravenous contrast. COMPARISON:  CT scan April 14, 2021.  MRI of the head April 16, 2021. FINDINGS: Brain: No subdural, epidural, or subarachnoid hemorrhage. No mass effect or midline shift. Ventricles and sulci are unchanged. The known right basal ganglia  infarct is more pronounced on CT imaging in the interval seen on series 4, image 17 with decreased attenuation. No associated hemorrhage. White matter changes otherwise stable. Cerebellum, brainstem, and basal cisterns are normal. No other evidence of acute infarct or ischemia. Vascular: Calcified atherosclerosis in the intracranial carotids. Skull: Normal. Negative for fracture or focal lesion. Sinuses/Orbits: No acute finding. Other: No other abnormalities. IMPRESSION: 1. The right basal ganglia infarct is better visualized on today's CT compared to the April 14, 2021 CT due to group decreasing attenuation. No associated hemorrhage identified. No other significant interval changes. Electronically Signed   By: Dorise Bullion III M.D   On: 04/18/2021 13:04       Medical Problem List and Plan: 1.  Left side weakness secondary to nonhemorrhagic right basal ganglia infarction  -patient may shower  -ELOS/Goals: 10-12 days, mod I goals for PT, OT, SLP 2.  Antithrombotics: -DVT/anticoagulation:  SCD  -antiplatelet  therapy: PLAN IS TO RESUME ASPIRIN ONLY 81 MG DAILY IN 5-7 DAYS IF NO FURTHER BLEEDING 3. Pain Management: Tramadol 50 mg every 12 hours as needed 4. Mood: Xanax 0.5 mg 3 times daily as needed.  Provide emotional support  -antipsychotic agents: N/A 5. Neuropsych: This patient is capable of making decisions on her own behalf. 6. Skin/Wound Care: Routine skin checks 7. Fluids/Electrolytes/Nutrition: Routine in and outs with follow-up chemistries 8.  Acute lower GI bleed with blood loss anemia.  Follow-up GI services.  Resume ASA as above. - Continue Protonix 40 mg twice daily 9.  Chronic diastolic congestive heart failure.  Monitor for any signs of fluid overload.  Continue Lasix 40-80 mg Monday Wednesday Friday  -daily weights 10.  End-stage renal disease.  Hemodialysis as per renal services 11.  Hypertension.  Coreg 3.125 mg twice daily 12.  Asthma.  Continue inhalers as directed 13.Hyperlipidemia.Crestor 14.Obesity.BMI 34.31.Dietery follow up 15.  Diabetes mellitus.hemoglobin AIc 5.2.  Blood sugar checks discontinued    Cathlyn Parsons, Hershal Coria 04/20/2021

## 2021-04-20 NOTE — Progress Notes (Signed)
Physical Therapy Treatment Patient Details Name: Carolyn Russo MRN: [id number] DOB: Jul 20, 1951 Today's Date: 04/20/2021    History of Present Illness 70 y.o. female presented 04/16/21 with left-sided weakness that started about 3 days PTA. MRI brain acute ischemic nonhemorrhagic right basal ganglia infarct PMH diabetes, hypertension, hyperlipidemia, ESRD on HD MWF    PT Comments    Patient sleepy but arousable on arrival. Bed and gown soaked. Patient able to cooperate with changing gown and then standing to change undergarments. After standing 3 minutes, she requested seated rest and pivoted to chair with min assist. Once in recliner, pt very lethargic and RN notified and in to assess pt. Apparently pt was dialyzed late last night and was up late talking on phone and believed to be fatigued. Patient too lethargic to safely attempt ambulation.    Follow Up Recommendations  CIR     Equipment Recommendations  Rolling walker with 5" wheels    Recommendations for Other Services       Precautions / Restrictions Precautions Precautions: Fall    Mobility  Bed Mobility               General bed mobility comments: pt sitting at EOB on arrival; drifting off to sleep and leaning to her left    Transfers Overall transfer level: Needs assistance Equipment used: Rolling walker (2 wheeled) Transfers: Sit to/from Stand Sit to Stand: Min assist Stand pivot transfers: Min assist       General transfer comment: vc for hand placement with use of RW; steadying assist due to postural instability  Ambulation/Gait             General Gait Details: unable due to lethargy; stood x 3 minutes for changing undergarment and then too fatigued/lethargic to walk   Stairs             Wheelchair Mobility    Modified Rankin (Stroke Patients Only) Modified Rankin (Stroke Patients Only) Pre-Morbid Rankin Score: Moderate disability Modified Rankin: Moderately severe disability      Balance Overall balance assessment: Needs assistance Sitting-balance support: Feet supported Sitting balance-Leahy Scale: Fair     Standing balance support: Bilateral upper extremity supported;During functional activity Standing balance-Leahy Scale: Poor Standing balance comment: needs UE support                            Cognition Arousal/Alertness: Lethargic Behavior During Therapy: Flat affect Overall Cognitive Status: Difficult to assess                                        Exercises      General Comments General comments (skin integrity, edema, etc.): initially more easily aroused; gown soaked and pt stating she spilled something but smelled of urine; able to stand long enough to remove purewick and mesh briefs and don absorbent undergarment for walking. Fatigued and sat down to rest in chair; pt immediately falling asleep if not stimulated; Nephrologist in to assess and pt unable to stay awake and not following commands for deep breaths during assessment; RN made aware and in to assess patient wiht all VSS; RN reports late dialysis last night and then was up most of the night      Pertinent Vitals/Pain Pain Assessment: No/denies pain Faces Pain Scale: No hurt    Home Living  Prior Function            PT Goals (current goals can now be found in the care plan section) Acute Rehab PT Goals Patient Stated Goal: regain ability to walk Time For Goal Achievement: 05/01/21 Potential to Achieve Goals: Good Progress towards PT goals: Not progressing toward goals - comment (lethargic today)    Frequency    Min 4X/week      PT Plan Current plan remains appropriate    Co-evaluation              AM-PAC PT "6 Clicks" Mobility   Outcome Measure  Help needed turning from your back to your side while in a flat bed without using bedrails?: A Little Help needed moving from lying on your back to sitting on  the side of a flat bed without using bedrails?: A Little Help needed moving to and from a bed to a chair (including a wheelchair)?: A Little Help needed standing up from a chair using your arms (e.g., wheelchair or bedside chair)?: A Little Help needed to walk in hospital room?: Total Help needed climbing 3-5 steps with a railing? : Total 6 Click Score: 14    End of Session Equipment Utilized During Treatment: Gait belt Activity Tolerance: Patient limited by lethargy Patient left: in chair;with call bell/phone within reach;with chair alarm set;with nursing/sitter in room Nurse Communication: Mobility status PT Visit Diagnosis: Hemiplegia and hemiparesis Hemiplegia - Right/Left: Left Hemiplegia - dominant/non-dominant: Non-dominant Hemiplegia - caused by: Cerebral infarction     Time: 9449-6759 PT Time Calculation (min) (ACUTE ONLY): 28 min  Charges:  $Therapeutic Activity: 23-37 mins                      Arby Barrette, PT Pager [phone]    Rexanne Mano 04/20/2021, 9:48 AM

## 2021-04-20 NOTE — Progress Notes (Signed)
[facility] Progress Note   Subjective:     Patient seen and examined at bedside. PT also at bedside. Patient appeared more lethargic compared to yesterday-opens eyes to name and continues to answer questions appropriately. Denies SOB and CP. Received HD yesterday. Plan for HD 04/21/21.  Objective Vitals:   04/20/21 0105 04/20/21 0122 04/20/21 0354 04/20/21 0815  BP: 130/63 136/62 (!) 150/76 (!) 159/68  Pulse: 81 87 85   Resp: 16 20 20 17   Temp: 98.6 F (37 C) 98 F (36.7 C) 98 F (36.7 C) 98 F (36.7 C)  TempSrc: Oral  Oral Oral  SpO2: 98% 99% 98% 98%  Weight: 85.1 kg     Height:       Physical Exam General: Ill-appearing female; more lethargic today-opens eyes to name; responds to questions; NAD Heart: S1 and S2; No murmurs, gallops, or rubs Lungs: Clear throughout; No wheezing, rales, or rhonchi Abdomen: Soft and non-tender Extremities: No edema BLLE Dialysis Access: [facility]    Filed Weights   04/18/21 1650 04/19/21 2100 04/20/21 0105  Weight: 83.9 kg 86.3 kg 85.1 kg    Intake/Output Summary (Last 24 hours) at 04/20/2021 1028 Last data filed at 04/20/2021 0105 Gross per 24 hour  Intake 480 ml  Output 1500 ml  Net -1020 ml    Additional Objective Labs: Basic Metabolic Panel: Recent Labs  Lab 04/17/21 0224 04/18/21 1006 04/19/21 0413  NA 136 136 140  K 3.6 3.9 3.8  CL 97* 98 99  CO2 29 26 28   GLUCOSE 90 101* 87  BUN 16 27* 30*  CREATININE 4.08* 6.06* 6.39*  CALCIUM 8.8* 8.7* 8.7*  PHOS 3.7  --   --    Liver Function Tests: Recent Labs  Lab 04/14/21 1824 04/17/21 0224  AST 27  --   ALT 21  --   ALKPHOS 59  --   BILITOT 0.7  --   PROT 6.9  --   ALBUMIN 3.4* 3.1*   No results for input(s): LIPASE, AMYLASE in the last 168 hours. CBC: Recent Labs  Lab 04/14/21 1824 04/14/21 1833 04/16/21 1215 04/17/21 0224 04/17/21 1504 04/18/21 1006 04/18/21 1621 04/19/21 1525 04/19/21 2133 04/20/21 0405  WBC 9.9  --  9.0 9.9  --   12.9*  --   --   --   --   NEUTROABS 5.4  --  4.2 3.6  --  6.2  --   --   --   --   HGB 8.8*   < > 9.0* 8.9*   < > 9.9*   < > 9.6* 9.4* 9.6*  HCT 26.7*   < > 27.3* 26.1*   < > 29.2*   < > 28.3* 28.3* 28.1*  MCV 87.5  --  88.1 85.9  --  87.7  --   --   --   --   PLT 265  --  282 267  --  233  --   --   --   --    < > = values in this interval not displayed.   Blood Culture No results found for: SDES, SPECREQUEST, CULT, REPTSTATUS  Cardiac Enzymes: No results for input(s): CKTOTAL, CKMB, CKMBINDEX, TROPONINI in the last 168 hours. CBG: Recent Labs  Lab 04/19/21 1551 04/19/21 2033 04/20/21 0117 04/20/21 0353 04/20/21 0751  GLUCAP 186* 195* 108* 117* 127*   Iron Studies: No results for input(s): IRON, TIBC, TRANSFERRIN, FERRITIN in the last 72 hours. Lab Results  Component Value  Date   INR 1.0 04/14/2021   INR 1.0 03/31/2017   Studies/Results: CT HEAD WO CONTRAST  Result Date: 04/18/2021 CLINICAL DATA:  Follow-up stroke. EXAM: CT HEAD WITHOUT CONTRAST TECHNIQUE: Contiguous axial images were obtained from the base of the skull through the vertex without intravenous contrast. COMPARISON:  CT scan April 14, 2021.  MRI of the head April 16, 2021. FINDINGS: Brain: No subdural, epidural, or subarachnoid hemorrhage. No mass effect or midline shift. Ventricles and sulci are unchanged. The known right basal ganglia infarct is more pronounced on CT imaging in the interval seen on series 4, image 17 with decreased attenuation. No associated hemorrhage. White matter changes otherwise stable. Cerebellum, brainstem, and basal cisterns are normal. No other evidence of acute infarct or ischemia. Vascular: Calcified atherosclerosis in the intracranial carotids. Skull: Normal. Negative for fracture or focal lesion. Sinuses/Orbits: No acute finding. Other: No other abnormalities. IMPRESSION: 1. The right basal ganglia infarct is better visualized on today's CT compared to the April 14, 2021 CT due to group  decreasing attenuation. No associated hemorrhage identified. No other significant interval changes. Electronically Signed   By: Dorise Bullion III M.D   On: 04/18/2021 13:04    Medications:  ondansetron (ZOFRAN) IV      arformoterol  15 mcg Nebulization BID   budesonide (PULMICORT) nebulizer solution  0.5 mg Nebulization BID   carvedilol  3.125 mg Oral BID WC   Chlorhexidine Gluconate Cloth  6 each Topical Daily   furosemide  40-80 mg Oral Q M,W,F   hydrocortisone  25 mg Rectal BID   ondansetron       pantoprazole (PROTONIX) IV  40 mg Intravenous Q12H   rosuvastatin  20 mg Oral Daily   sevelamer carbonate  800 mg Oral TID WC    Dialysis Orders: [facility]-MWF 4 hrs 180NRe 400/Autoflow 1.5 86.5 kg 2.0K/2.0 Ca TDC -heparin 2000 units IV TIW -Mircera 100 mcg IV q 2 weeks (last dose 04/14/2021 -Hectorol 5 mcg IV TIW  Assessment/Plan: Acute subcortical R CVA-per Primary/Neurology Acute LGIB-H/O hemorrhoidal bleeding in past. new onset 06/25 post clopidogrel load. HGB 9.0 on admission, fell to 7.2. S/P 2 units PRBCS on 04/18/21. Hgb now 9.6. Clopidogrel and ASA on hold. No heparin with HD. Per GI: EGD and flex sig with no etiology and bleeding scan (-).   ESRD -  MWF-received HD overnight. DC Heparin-new GIB. Continue 4.0 K bath. K+ 3.8. Plan for HD 6/29  Hypertension/volume  - BP slightly elevated. She is under EDW. No evidence of volume overload by exam. UF as tolerated-more likely will lower EDW at discharge.   Anemia  - Hgb now 9.6. As noted in # 2. Follow HGB. Transfuse PRN.  Metabolic bone disease -  No on clear liquid diet until cleared by swallow-evaluation. PTH only 110 03/26/21 on 5 mcg Hectorol. Hold VDRA.  PO4 at goal. Renvela 1 G PO TIW when able to eat.  Nutrition - Now on clear liquid diet- Speech therapy consulted-awaiting swallow evaluation.  DTM2- per primary  Tobie Poet, NP [facility] 04/20/2021,10:28 AM  LOS: 4 days

## 2021-04-20 NOTE — Care Management Important Message (Signed)
Important Message  Patient Details  Name: Carolyn Russo MRN: [id number] Date of Birth: September 13, 1951   Medicare Important Message Given:  Yes - Important Message mailed due to current National Emergency   Verbal consent obtained due to current National Emergency  Relationship to patient: Self Contact Name: Adjoa Call Date: 04/20/21  Time: 1222 Phone: [phone] Outcome: No Answer/Busy Important Message mailed to: Patient address on file    Delorse Lek 04/20/2021, 12:23 PM

## 2021-04-20 NOTE — Progress Notes (Signed)
Seen and examined this morning-she did not sleep last night-and had a slight bout of delirium-this morning when I saw her-she was eating breakfast-following which she slept for an hour and was hard to wake up.  I have again seen her just a few minutes ago-she is completely awake and alert and sitting at bedside chair.  I spoke to GI-okay to discharge to CIR today-we will plan on resuming aspirin in a week time (spoke with neurology-Dr. Lubertha Basque not advise dual antiplatelets given GI bleeding).  See discharge summary for further details.  I have updated patient's spouse.

## 2021-04-20 NOTE — H&P (Signed)
Physical Medicine and Rehabilitation Admission H&P       HPI:Carolyn Russo is a 70 year old right-handed female with history significant for hypertension, obesity with BMI 34.31, hyperlipidemia, newly diagnosed end-stage renal disease on hemodialysis Monday Wednesday Friday, diabetes mellitus, CAD maintained on aspirin, iron deficiency anemia and asthma.  Patient with latest admission 03/09/2021 to 03/24/2021 for acute on chronic diastolic congestive heart failure exacerbation.  Per chart review patient lives with spouse.  Two-level home bed and bath main level 4 steps to entry.  Independent with cane prior to admission.  Presented 04/16/2021 with left-sided weakness x3 days and slurred speech with bouts of vomiting.  It was noted patient had initially presented to the ER couple days prior to the latest admission with complaints of left-sided weakness but left without being formally evaluated.  Cranial CT scan showed no acute abnormalities.  Patient did not receive tPA.  MRI of the brain showed a 2.1 cm acute ischemic nonhemorrhagic right basal ganglia infarction.  MRA angiogram of head and neck grossly negative no large vessel occlusion or high-grade stenosis.  Most recent echocardiogram with ejection fraction of 40% grade 2 diastolic dysfunction.  Admission chemistries hemoglobin 8.8-9.0, chemistries unremarkable sent creatinine 3.58, hemoglobin A1c 5.2.  Hospital course gastroenterology Dr. Carlean Purl consulted 04/18/2021 after patient had multiple large bloody bowel movements with clots.  Underwent upper endoscopy 04/18/2021 showing normal esophagus and stomach and the cardia and gastric fundus normal on retroflexion and also underwent flexible sigmoidoscopy showing hemorrhoids found on perianal exam external/internal hemorrhoids.  Patient had initially been placed on aspirin and Plavix for CVA prophylaxis held due to suspected lower GI bleed awaiting plan on resuming anticoagulation.  Follow-up renal  services with hemodialysis ongoing as directed.  Currently on a soft diet advanced as tolerated.  Due to patient decreased functional ability left side weakness was admitted for a comprehensive rehab program.   Review of Systems Constitutional:  Positive for malaise/fatigue. Negative for chills and fever. HENT:  Negative for hearing loss.   Eyes:  Negative for blurred vision and double vision. Respiratory:  Negative for cough.        Shortness of breath on exertion  Cardiovascular:  Negative for chest pain, palpitations and leg swelling. Gastrointestinal:  Positive for blood in stool and vomiting.       GERD  Genitourinary:  Negative for dysuria, flank pain and hematuria. Musculoskeletal:  Positive for joint pain and myalgias. Skin:  Negative for rash. Neurological:  Positive for speech change and weakness.  All other systems reviewed and are negative.     Past Medical History:  Diagnosis Date   Allergy     Anterior chest wall pain     Appendicitis 1965   Asthma     Body mass index 37.0-37.9, adult     Breast pain     Cataract      both eyes   Chronic renal disease, stage II     Dehydration 2014   Deviated septum 1971   Diabetes mellitus     Dyspnea 2014   Extrinsic asthma      WITH ASTHMA ATTACK   Fibroid 1980   GERD (gastroesophageal reflux disease)     Heart murmur     Hx gestational diabetes     Hyperlipidemia     Hypertension 2014   Inguinal hernia 1959   Malaise and fatigue 2014   Nephropathy     Non-IgE mediated allergic asthma 2014   Obesity  Pelvic pain     Pregnancy, high-risk 1985   Tonsillitis 1968   Uterine fibroid 1980         Past Surgical History:  Procedure Laterality Date   [place]   COLONOSCOPY       ESOPHAGOGASTRODUODENOSCOPY (EGD) WITH PROPOFOL N/A 04/18/2021    Procedure: ESOPHAGOGASTRODUODENOSCOPY (EGD) WITH PROPOFOL;  Surgeon: Gatha Mayer, MD;  Location: [facility];  Service: Endoscopy;   Laterality: N/A;   EYE SURGERY        bilateral cataract   FLEXIBLE SIGMOIDOSCOPY N/A 04/18/2021    Procedure: FLEXIBLE SIGMOIDOSCOPY;  Surgeon: Gatha Mayer, MD;  Location: [facility] ENDOSCOPY;  Service: Endoscopy;  Laterality: N/A;   HERNIA REPAIR   1959   IR FLUORO GUIDE CV LINE RIGHT   03/18/2021   IR US GUIDE VASC ACCESS RIGHT   03/18/2021   LEFT HEART CATH AND CORONARY ANGIOGRAPHY N/A 04/04/2017    Procedure: Left Heart Cath and Coronary Angiography;  Surgeon: Belva Crome, MD;  Location: [facility];  Service: Cardiovascular;  Laterality: N/A;   MYOMECTOMY   1980, 2004, 2007   RHINOPLASTY   1971   ROTATOR CUFF REPAIR   2003   SURGICAL REPAIR OF HEMORRHAGE   2015   TONSILLECTOMY   1968         Family History  Problem Relation Age of Onset   Cancer Mother          abdominal melamona   Psoriasis Mother     Alzheimer's disease Father     Cancer Cousin          colon   Diabetes Cousin     Diabetes Maternal Aunt     Colon cancer Neg Hx     Colon polyps Neg Hx     Esophageal cancer Neg Hx     Rectal cancer Neg Hx     Stomach cancer Neg Hx     Breast cancer Neg Hx      Social History:  reports that she has never smoked. She has never used smokeless tobacco. She reports that she does not drink alcohol and does not use drugs. Allergies:       Allergies  Allergen Reactions   Food Anaphylaxis      Peanuts; Almonds   Other Shortness Of Breath      Peanuts; Almonds   Wheat Bran Shortness Of Breath   Statins Itching and Other (See Comments)      Generalized aches   Iodine Other (See Comments)      The patient denied this allergy to me on 03/16/2021   Shellfish Allergy Other (See Comments)      Mouth gets raw   Sitagliptin        Other reaction(s): Unknown   Tetracycline Other (See Comments)      Raw mouth   Tetracyclines & Related Itching   Contrast Media [Iodinated Diagnostic Agents] Rash      Happened during CT scan over 30 years ago          Medications Prior to  Admission  Medication Sig Dispense Refill   albuterol (PROVENTIL) (2.5 MG/3ML) 0.083% nebulizer solution Take 3 mLs (2.5 mg total) by nebulization every 4 (four) hours as needed for wheezing or shortness of breath. 75 mL 2   albuterol (VENTOLIN HFA) 108 (90 Base) MCG/ACT inhaler Inhale 2 puffs into the lungs every 4 (four) hours as needed for  wheezing or shortness of breath. 18 g 3   aspirin 81 MG tablet Take 81 mg by mouth daily.       b complex vitamins capsule Take 1 capsule by mouth daily.       BESIVANCE 0.6 % SUSP Place 1 drop into both eyes See admin instructions. After  eye injections       Caraway Oil-Levomenthol (FDGARD PO) Take 1 tablet by mouth 2 (two) times daily as needed (nausea).       carvedilol (COREG) 3.125 MG tablet Take 1 tablet (3.125 mg total) by mouth 2 (two) times daily with a meal. 60 tablet 0   Cholecalciferol (VITAMIN D3 PO) Take 2,000 Units by mouth daily.       Continuous Blood Gluc Receiver (DEXCOM G6 RECEIVER) DEVI Use as directed to check blood sugars 3 times per day dx: e11.65 (Patient taking differently: 1 each by Other route See admin instructions. Use as directed to check blood sugars 3 times per day dx: e11.65) 1 each 1   Continuous Blood Gluc Sensor (DEXCOM G6 SENSOR) MISC Use as directed to check blood sugars 3 times per day dx: e11.65 (Patient taking differently: 1 each by Other route See admin instructions. Use as directed to check blood sugars 3 times per day dx: e11.65) 3 each 2   Continuous Blood Gluc Transmit (DEXCOM G6 TRANSMITTER) MISC Use as directed to check blood sugars 3 times per day dx: e11.65 (Patient taking differently: 1 each by Other route See admin instructions. Use as directed to check blood sugars 3 times per day dx: e11.65) 1 each 1   docusate sodium (COLACE) 100 MG capsule Take 100 mg by mouth daily as needed for mild constipation.       fluticasone (FLONASE) 50 MCG/ACT nasal spray Place 2 sprays into the nose daily as needed for allergies.        furosemide (LASIX) 40 MG tablet Take 1-2 tablets (40-80 mg total) by mouth every Monday, Wednesday, and Friday. 80 mg twice a week. 40 mg all other days 30 tablet     hydrocortisone (ANUSOL-HC) 2.5 % rectal cream Place 1 application rectally 2 (two) times daily. 30 g 1   insulin aspart protamine - aspart (NOVOLOG MIX 70/30 FLEXPEN) (70-30) 100 UNIT/ML FlexPen Inject 1 mL (100 Units total) into the skin 2 (two) times daily with a meal. 20 units in the morning and 15 units at night (Patient taking differently: Inject 15-20 Units into the skin 2 (two) times daily with a meal. 20 units in the morning and 15 units at night)       magnesium oxide (MAG-OX) 400 (241.3 Mg) MG tablet Take 400 mg by mouth daily.       ondansetron (ZOFRAN) 4 MG tablet Take 1 tablet (4 mg total) by mouth every 6 (six) hours as needed for nausea or vomiting. 90 tablet 1   OneTouch Delica Lancets 16X MISC Use as directed to check blood sugars before breakfast and dinner dx: e11.65 (Patient taking differently: 1 each by Other route See admin instructions. Use as directed to check blood sugars before breakfast and dinner dx: e11.65) 100 each 11   rosuvastatin (CRESTOR) 20 MG tablet Take 1 tablet (20 mg total) by mouth daily. 90 tablet 3   sevelamer carbonate (RENVELA) 800 MG tablet Take 1 tablet (800 mg total) by mouth 3 (three) times daily with meals. 90 tablet 0   SYMBICORT 160-4.5 MCG/ACT inhaler Inhale 2 puffs into the lungs 2 (two)  times daily. 10.2 g 5   traMADol (ULTRAM) 50 MG tablet Take 1 tablet (50 mg total) by mouth every 12 (twelve) hours as needed for severe pain. 30 tablet 0   vitamin C (ASCORBIC ACID) 500 MG tablet Take 500 mg by mouth daily.          Drug Regimen Review Drug regimen was reviewed and remains appropriate with no significant issues identified   Home: Home Living Family/patient expects to be discharged to:: Private residence Living Arrangements: Spouse/significant other Available Help at  Discharge: Family Type of Home: House Home Access: Stairs to enter Technical brewer of Steps: 4 Entrance Stairs-Rails: Right Home Layout: Two level, Able to live on main level with bedroom/bathroom Bathroom Shower/Tub: Multimedia programmer: [place]: [place] - single point, Bedside commode, Walker - 4 wheels (has BSC over toilet to incr height; rollator is pt's husband that he does not use (uses cane))  Lives With: Spouse   Functional History: Prior Function Level of Independence: Independent with assistive device(s) Comments: was using cane PTA (had prolonged recent hospitalization with resulting weakness and was receiving HHPT); cannot shower due to port-a-cath in rt upper chest   Functional Status:  Mobility: Bed Mobility Overal bed mobility: Needs Assistance Bed Mobility: Rolling, Sidelying to Sit Rolling: Supervision Sidelying to sit: Min guard General bed mobility comments: Supervision A for rolling R. Min guard to elevate trunk with HOB elevated and increased time/effort. Transfers Overall transfer level: Needs assistance Equipment used: Rolling walker (2 wheeled) Transfers: Sit to/from Stand Sit to Stand: Min assist Stand pivot transfers: Min assist General transfer comment: vc for hand placement with use of RW; steadying assist due to postural instability Ambulation/Gait Ambulation/Gait assistance: Min assist, +2 physical assistance Gait Distance (Feet): 15 Feet (seated rest x 4 minutes; 15 ft) Assistive device: Rolling walker (2 wheeled) Gait Pattern/deviations: Step-through pattern, Decreased stride length, Decreased weight shift to right, Decreased dorsiflexion - left General Gait Details: unable to safely with 1 person and lines   ADL: ADL Overall ADL's : Needs assistance/impaired Lower Body Dressing: Maximal assistance, Sit to/from stand Lower Body Dressing Details (indicate cue type and reason): Max A to don footwear seated EOB.  Patient states husband assists PRN with donning footwear. Toilet Transfer: Minimal Print production planner Details (indicate cue type and reason): Simulated with transfer to recliner with increased time/effort and Min A. Postural instability. Functional mobility during ADLs: Minimal assistance General ADL Comments: Patient limited by RUE weakness, decreased static/dynamic standing balance and decreased safety awareness.   Cognition: Cognition Overall Cognitive Status: Within Functional Limits for tasks assessed Orientation Level: Oriented X4 Attention: Focused, Sustained Focused Attention: Appears intact Sustained Attention: Appears intact Memory: Impaired Memory Impairment: Decreased short term memory Decreased Short Term Memory: Verbal basic Problem Solving: Appears intact Executive Function: Reasoning Reasoning: Appears intact Cognition Arousal/Alertness: Awake/alert Behavior During Therapy: Flat affect Overall Cognitive Status: Within Functional Limits for tasks assessed General Comments: A&Ox4; followed multi-step commands; decreased awareness of need for safety.   Physical Exam: Blood pressure (!) 150/76, pulse 85, temperature 98 F (36.7 C), temperature source Oral, resp. rate 20, height 5\' 2"  (1.575 m), weight 85.1 kg, SpO2 98 %. Physical Exam Constitutional:      General: She is not in acute distress.    Appearance: She is obese. HENT:    Head: Normocephalic.    Right Ear: External ear normal.    Left Ear: External ear normal.    Nose: Nose normal. Eyes:  Extraocular Movements: Extraocular movements intact.    Conjunctiva/sclera: Conjunctivae normal.    Pupils: Pupils are equal, round, and reactive to light. Cardiovascular:    Rate and Rhythm: Normal rate and regular rhythm.    Heart sounds: No murmur heard.   No gallop. Pulmonary:    Effort: Pulmonary effort is normal. No respiratory distress.    Breath sounds: No wheezing or rales. Abdominal:     General: Bowel sounds are normal. There is no distension.    Palpations: Abdomen is soft.    Tenderness: There is no abdominal tenderness. Musculoskeletal:        General: No swelling.    Cervical back: Normal range of motion.    Comments: Mild tenderness left shoulder with ER/IR today  Skin:    General: Skin is warm and dry. Neurological:    Comments: Patient is alert.  No acute distress.  Makes eye contact with examiner.  Provides name and age.  Follows commands.  Fair insight and awareness. Provided biographical information. Normal language, speech slightly slurred. Mild left central 7. LUE 2 to 2+/5 deltoid, 3/5 biceps, triceps and 3+/5 wrist and HI. LLE 3-4/5 prox to distal. RUE and RLE 4-5/5 prox to distal. No sensory deficits. No abnl tone.   Psychiatric:        Mood and Affect: Mood normal.        Behavior: Behavior normal.     Lab Results Last 48 Hours        Results for orders placed or performed during the hospital encounter of 04/16/21 (from the past 48 hour(s))  Glucose, capillary     Status: None    Collection Time: 04/18/21  9:20 AM  Result Value Ref Range    Glucose-Capillary 95 70 - 99 mg/dL      Comment: Glucose reference range applies only to samples taken after fasting for at least 8 hours.  CBC with Differential/Platelet     Status: Abnormal    Collection Time: 04/18/21 10:06 AM  Result Value Ref Range    WBC 12.9 (H) 4.0 - 10.5 K/uL    RBC 3.33 (L) 3.87 - 5.11 MIL/uL    Hemoglobin 9.9 (L) 12.0 - 15.0 g/dL      Comment: REPEATED TO VERIFY POST TRANSFUSION SPECIMEN      HCT 29.2 (L) 36.0 - 46.0 %    MCV 87.7 80.0 - 100.0 fL    MCH 29.7 26.0 - 34.0 pg    MCHC 33.9 30.0 - 36.0 g/dL    RDW 15.0 11.5 - 15.5 %    Platelets 233 150 - 400 K/uL    nRBC 0.0 0.0 - 0.2 %    Neutrophils Relative % 48 %    Neutro Abs 6.2 1.7 - 7.7 K/uL    Lymphocytes Relative 38 %    Lymphs Abs 4.9 (H) 0.7 - 4.0 K/uL    Monocytes Relative 12 %    Monocytes Absolute 1.5 (H) 0.1 -  1.0 K/uL    Eosinophils Relative 2 %    Eosinophils Absolute 0.2 0.0 - 0.5 K/uL    Basophils Relative 0 %    Basophils Absolute 0.0 0.0 - 0.1 K/uL    Immature Granulocytes 0 %    Abs Immature Granulocytes 0.05 0.00 - 0.07 K/uL      Comment: Performed at [facility], [address], [place], [place] [address]  Basic metabolic panel     Status: Abnormal    Collection Time: 04/18/21 10:06 AM  Result Value Ref Range    Sodium 136 135 - 145 mmol/L    Potassium 3.9 3.5 - 5.1 mmol/L    Chloride 98 98 - 111 mmol/L    CO2 26 22 - 32 mmol/L    Glucose, Bld 101 (H) 70 - 99 mg/dL      Comment: Glucose reference range applies only to samples taken after fasting for at least 8 hours.    BUN 27 (H) 8 - 23 mg/dL    Creatinine, Ser 6.06 (H) 0.44 - 1.00 mg/dL    Calcium 8.7 (L) 8.9 - 10.3 mg/dL    GFR, Estimated 7 (L) >60 mL/min      Comment: (NOTE) Calculated using the CKD-EPI Creatinine Equation (2021)      Anion gap 12 5 - 15      Comment: Performed at [facility] [address], [place], [name] [address]  Glucose, capillary     Status: Abnormal    Collection Time: 04/18/21 11:43 AM  Result Value Ref Range    Glucose-Capillary 100 (H) 70 - 99 mg/dL      Comment: Glucose reference range applies only to samples taken after fasting for at least 8 hours.  Hemoglobin and hematocrit, blood     Status: Abnormal    Collection Time: 04/18/21  4:21 PM  Result Value Ref Range    Hemoglobin 10.2 (L) 12.0 - 15.0 g/dL    HCT 30.0 (L) 36.0 - 46.0 %      Comment: Performed at [facility], [place] [address] [place], [name] [address]  Glucose, capillary     Status: Abnormal    Collection Time: 04/18/21  4:28 PM  Result Value Ref Range    Glucose-Capillary 114 (H) 70 - 99 mg/dL      Comment: Glucose reference range applies only to samples taken after fasting for at least 8 hours.  Glucose, capillary     Status: Abnormal    Collection Time: 04/18/21  9:00 PM  Result Value Ref Range     Glucose-Capillary 108 (H) 70 - 99 mg/dL      Comment: Glucose reference range applies only to samples taken after fasting for at least 8 hours.  Hemoglobin and hematocrit, blood     Status: Abnormal    Collection Time: 04/18/21  9:25 PM  Result Value Ref Range    Hemoglobin 10.5 (L) 12.0 - 15.0 g/dL      Comment: REPEATED TO VERIFY    HCT 30.0 (L) 36.0 - 46.0 %      Comment: Performed at [facility] [address] [place], [name] [address]  Glucose, capillary     Status: None    Collection Time: 04/18/21 11:46 PM  Result Value Ref Range    Glucose-Capillary 89 70 - 99 mg/dL      Comment: Glucose reference range applies only to samples taken after fasting for at least 8 hours.  Glucose, capillary     Status: None    Collection Time: 04/19/21  3:46 AM  Result Value Ref Range    Glucose-Capillary 85 70 - 99 mg/dL      Comment: Glucose reference range applies only to samples taken after fasting for at least 8 hours.  Hemoglobin and hematocrit, blood     Status: Abnormal    Collection Time: 04/19/21  4:13 AM  Result Value Ref Range    Hemoglobin 9.3 (L) 12.0 - 15.0 g/dL    HCT 27.6 (  L) 36.0 - 46.0 %      Comment: Performed at [facility], [place] [address] [place], [place] [address]  Basic metabolic panel     Status: Abnormal    Collection Time: 04/19/21  4:13 AM  Result Value Ref Range    Sodium 140 135 - 145 mmol/L    Potassium 3.8 3.5 - 5.1 mmol/L    Chloride 99 98 - 111 mmol/L    CO2 28 22 - 32 mmol/L    Glucose, Bld 87 70 - 99 mg/dL      Comment: Glucose reference range applies only to samples taken after fasting for at least 8 hours.    BUN 30 (H) 8 - 23 mg/dL    Creatinine, Ser 6.39 (H) 0.44 - 1.00 mg/dL    Calcium 8.7 (L) 8.9 - 10.3 mg/dL    GFR, Estimated 6 (L) >60 mL/min      Comment: (NOTE) Calculated using the CKD-EPI Creatinine Equation (2021)      Anion gap 13 5 - 15      Comment: Performed at [facility] [address] [place], [name]  [address]  Glucose, capillary     Status: None    Collection Time: 04/19/21  7:30 AM  Result Value Ref Range    Glucose-Capillary 96 70 - 99 mg/dL      Comment: Glucose reference range applies only to samples taken after fasting for at least 8 hours.  Hemoglobin and hematocrit, blood     Status: Abnormal    Collection Time: 04/19/21  9:59 AM  Result Value Ref Range    Hemoglobin 9.2 (L) 12.0 - 15.0 g/dL    HCT 27.6 (L) 36.0 - 46.0 %      Comment: Performed at [facility] [address], [place], [name] [address]  Glucose, capillary     Status: Abnormal    Collection Time: 04/19/21 11:55 AM  Result Value Ref Range    Glucose-Capillary 127 (H) 70 - 99 mg/dL      Comment: Glucose reference range applies only to samples taken after fasting for at least 8 hours.  Hemoglobin and hematocrit, blood     Status: Abnormal    Collection Time: 04/19/21  3:25 PM  Result Value Ref Range    Hemoglobin 9.6 (L) 12.0 - 15.0 g/dL    HCT 28.3 (L) 36.0 - 46.0 %      Comment: Performed at [facility], [place] [address] [place], [name] [address]  Glucose, capillary     Status: Abnormal    Collection Time: 04/19/21  3:51 PM  Result Value Ref Range    Glucose-Capillary 186 (H) 70 - 99 mg/dL      Comment: Glucose reference range applies only to samples taken after fasting for at least 8 hours.  Glucose, capillary     Status: Abnormal    Collection Time: 04/19/21  8:33 PM  Result Value Ref Range    Glucose-Capillary 195 (H) 70 - 99 mg/dL      Comment: Glucose reference range applies only to samples taken after fasting for at least 8 hours.  Hemoglobin and hematocrit, blood     Status: Abnormal    Collection Time: 04/19/21  9:33 PM  Result Value Ref Range    Hemoglobin 9.4 (L) 12.0 - 15.0 g/dL    HCT 28.3 (L) 36.0 - 46.0 %      Comment: Performed at [facility], [address] [place], [name] [address]  Glucose, capillary     Status: Abnormal    Collection Time: 04/20/21  1:17 AM   Result Value Ref Range    Glucose-Capillary 108 (H) 70 - 99 mg/dL      Comment: Glucose reference range applies only to samples taken after fasting for at least 8 hours.  Glucose, capillary     Status: Abnormal    Collection Time: 04/20/21  3:53 AM  Result Value Ref Range    Glucose-Capillary 117 (H) 70 - 99 mg/dL      Comment: Glucose reference range applies only to samples taken after fasting for at least 8 hours.  Hemoglobin and hematocrit, blood     Status: Abnormal    Collection Time: 04/20/21  4:05 AM  Result Value Ref Range    Hemoglobin 9.6 (L) 12.0 - 15.0 g/dL    HCT 28.1 (L) 36.0 - 46.0 %      Comment: Performed at [facility] [address] [place], [place] [address]       Imaging Results (Last 48 hours)  CT HEAD WO CONTRAST   Result Date: 04/18/2021 CLINICAL DATA:  Follow-up stroke. EXAM: CT HEAD WITHOUT CONTRAST TECHNIQUE: Contiguous axial images were obtained from the base of the skull through the vertex without intravenous contrast. COMPARISON:  CT scan April 14, 2021.  MRI of the head April 16, 2021. FINDINGS: Brain: No subdural, epidural, or subarachnoid hemorrhage. No mass effect or midline shift. Ventricles and sulci are unchanged. The known right basal ganglia infarct is more pronounced on CT imaging in the interval seen on series 4, image 17 with decreased attenuation. No associated hemorrhage. White matter changes otherwise stable. Cerebellum, brainstem, and basal cisterns are normal. No other evidence of acute infarct or ischemia. Vascular: Calcified atherosclerosis in the intracranial carotids. Skull: Normal. Negative for fracture or focal lesion. Sinuses/Orbits: No acute finding. Other: No other abnormalities. IMPRESSION: 1. The right basal ganglia infarct is better visualized on today's CT compared to the April 14, 2021 CT due to group decreasing attenuation. No associated hemorrhage identified. No other significant interval changes. Electronically Signed   By:  Dorise Bullion III M.D   On: 04/18/2021 13:04             Medical Problem List and Plan: 1.  Left side weakness secondary to nonhemorrhagic right basal ganglia infarction             -patient may shower             -ELOS/Goals: 10-12 days, mod I goals for PT, OT, SLP 2.  Antithrombotics: -DVT/anticoagulation:  SCD             -antiplatelet therapy: PLAN IS TO RESUME ASPIRIN ONLY 81 MG DAILY IN 5-7 DAYS IF NO FURTHER BLEEDING 3. Pain Management: Tramadol 50 mg every 12 hours as needed 4. Mood: Xanax 0.5 mg 3 times daily as needed.  Provide emotional support             -antipsychotic agents: N/A 5. Neuropsych: This patient is capable of making decisions on her own behalf. 6. Skin/Wound Care: Routine skin checks 7. Fluids/Electrolytes/Nutrition: Routine in and outs with follow-up chemistries 8.  Acute lower GI bleed with blood loss anemia.  Follow-up GI services.  Resume ASA as above. - Continue Protonix 40 mg twice daily 9.  Chronic diastolic congestive heart failure.  Monitor for any signs of fluid overload.  Continue Lasix 40-80 mg Monday Wednesday  Friday             -daily weights 10.  End-stage renal disease.  Hemodialysis as per renal services 11.  Hypertension.  Coreg 3.125 mg twice daily 12.  Asthma.  Continue inhalers as directed 13.Hyperlipidemia.Crestor 14.Obesity.BMI 34.31.Dietery follow up 15.  Diabetes mellitus.hemoglobin AIc 5.2.  Blood sugar checks discontinued     Cathlyn Parsons, PA-C 04/20/2021   I have personally performed a face to face diagnostic evaluation of this patient and formulated the key components of the plan.  Additionally, I have personally reviewed laboratory data, imaging studies, as well as relevant notes and concur with the physician assistant's documentation above.  The patient's status has not changed from the original H&P.  Any changes in documentation from the acute care chart have been noted above.  Meredith Staggers, MD, [facility]

## 2021-04-20 NOTE — Discharge Summary (Signed)
PATIENT DETAILS Name: Carolyn Russo Age: 70 y.o. Sex: female Date of Birth: 04/18/1951 MRN: [id number]. Admitting Physician: Norval Morton, MD [id number], Bailey Mech, MD  Admit Date: 04/16/2021 Discharge date: 04/20/2021  Recommendations for Outpatient Follow-up:  Follow up with PCP in 1-2 weeks Follow CBC periodically Resume ASA in approximately 5-7 days if no recurrence of lower GI bleeding. Ensure follow-up with stroke clinic, nephrology  Admitted From:  Home  Disposition: [place]: No  Equipment/Devices: None  Discharge Condition: Stable  CODE STATUS: FULL CODE  Diet recommendation:  Diet Order             DIET SOFT Room service appropriate? Yes; Fluid consistency: Thin  Diet effective now           Diet - low sodium heart healthy           Diet Carb Modified                    Brief Summary: 70 year old with a history of HLD, DM-2, CAD, iron deficiency anemia, bronchial asthma, ESRD on HD MWF-who presented with left-sided weakness-she was found to have acute CVA.  She was started on aspirin/Plavix-further hospital course was complicated by lower GI bleeding with acute blood loss anemia.  See below for further details.   Significant events: 5/17-6/1: Hospitalization for volume overload-progressive renal failure-started on HD 6/24>> admit for acute CVA 6/25>> lower GI bleeding-hematochezia-passing clots 6/26 >>2 units of PRBC transfused.   Significant studies: 5/19>> TTE: EF 50% 6/24>> CXR: No PNA. 6/22>> CT head: No acute intracranial findings. 6/24>> CT head: Right basal ganglia infarct 6/24>> A1c: 5.2 6/24>> LDL: 70 6/25>> MRI brain: 2.1 cm acute nonhemorrhagic right basal ganglia infarct. 6/25>> MRA brain/neck: No high-grade/correctable stenosis.   Procedures: 6/26>> EGD: No obvious bleeding source evident.  Normal esophagus/stomach/duodenum. 6/26>> flexible sigmoidoscopy: Hemorrhoids-large amount of stool-visualization  difficult.  Brief Hospital Course: Acute CVA: Likely due to small vessel disease-continues to have mild left-sided weakness-initially neurology recommended aspirin/Plavix on discharge.  However patient developed GI bleeding with acute blood loss anemia requiring 2 units of PRBC transfusion.  Spoke with stroke MD-Dr. Wyline Beady only aspirin-we will start in approximately 1 week time.  Continue Crestor on discharge.   Acute lower GI bleeding with blood loss anemia: No further hematochezia x48 hours-hemoglobin stable.  GI does not recommend any further procedures.  EGD/flexible sigmoidoscopy as above.     ESRD on HD MWF: Nephrology followed closely   Chronic diastolic heart failure: Volume status stable-volume overload with HD.   HTN: Permissive hypertension was allowed in the setting of acute CVA-and then with GI bleeding.  BP now starting to creep up-we will resume low-dose Coreg-remains on Lasix on days of dialysis.  Continue to follow and optimize over the next few days.       HLD: Continue statin   DM-2: CBG stable with SSI.  Resume insulin 70/30 when able.  Obesity: Body mass index is 33.83 kg/m.   Discharge Diagnoses:  Principal Problem:   CVA (cerebral vascular accident) ([place]) Active Problems:   Type 2 diabetes mellitus (HCC)   Dyslipidemia (high LDL; low HDL)   Obesity (BMI 30-39.9)   Chronic diastolic CHF (congestive heart failure) (HCC)   Hypertensive urgency   Anemia of chronic disease   ESRD (end stage renal disease) (HCC)   Acute GI bleeding   External hemorrhoids   Discharge Instructions:  Activity:  As tolerated with Full fall precautions use walker/cane &  assistance as needed   Discharge Instructions     Ambulatory referral to Neurology   Complete by: As directed    Follow up with stroke clinic NP (Jessica Vanschaick or Cecille Rubin, if both not available, consider Zachery Dauer, or [place]) at [facility] in about 4 weeks. Thanks.   Diet - low sodium heart  healthy   Complete by: As directed    Diet Carb Modified   Complete by: As directed    Increase activity slowly   Complete by: As directed    No dressing needed   Complete by: As directed       Allergies as of 04/20/2021       Reactions   Food Anaphylaxis   Peanuts; Almonds   Other Shortness Of Breath   Peanuts; Almonds   Wheat Bran Shortness Of Breath   Statins Itching, Other (See Comments)   Generalized aches   Iodine Other (See Comments)   The patient denied this allergy to me on 03/16/2021   Shellfish Allergy Other (See Comments)   Mouth gets raw   Sitagliptin    Other reaction(s): Unknown   Tetracycline Other (See Comments)   Raw mouth   Tetracyclines & Related Itching   Contrast Media [iodinated Diagnostic Agents] Rash   Happened during CT scan over 30 years ago        Medication List     STOP taking these medications    aspirin 81 MG tablet   docusate sodium 100 MG capsule Commonly known as: COLACE   NovoLOG Mix 70/30 FlexPen (70-30) 100 UNIT/ML FlexPen Generic drug: insulin aspart protamine - aspart       TAKE these medications    albuterol 108 (90 Base) MCG/ACT inhaler Commonly known as: VENTOLIN HFA Inhale 2 puffs into the lungs every 4 (four) hours as needed for wheezing or shortness of breath.   albuterol (2.5 MG/3ML) 0.083% nebulizer solution Commonly known as: PROVENTIL Take 3 mLs (2.5 mg total) by nebulization every 4 (four) hours as needed for wheezing or shortness of breath.   b complex vitamins capsule Take 1 capsule by mouth daily.   Besivance 0.6 % Susp Generic drug: Besifloxacin HCl Place 1 drop into both eyes See admin instructions. After  eye injections   carvedilol 3.125 MG tablet Commonly known as: COREG Take 1 tablet (3.125 mg total) by mouth 2 (two) times daily with a meal.   Dexcom G6 Receiver Devi Use as directed to check blood sugars 3 times per day dx: e11.65 What changed:  how much to take how to take  this when to take this   Dexcom G6 Sensor Misc Use as directed to check blood sugars 3 times per day dx: e11.65 What changed:  how much to take how to take this when to take this   Dexcom G6 Transmitter Misc Use as directed to check blood sugars 3 times per day dx: e11.65 What changed:  how much to take how to take this when to take this   FDGARD PO Take 1 tablet by mouth 2 (two) times daily as needed (nausea).   fluticasone 50 MCG/ACT nasal spray Commonly known as: FLONASE Place 2 sprays into the nose daily as needed for allergies.   furosemide 40 MG tablet Commonly known as: LASIX Take 1-2 tablets (40-80 mg total) by mouth every Monday, Wednesday, and Friday. 80 mg twice a week. 40 mg all other days   hydrocortisone 2.5 % rectal cream Commonly known as: Anusol-HC Place 1 application  rectally 2 (two) times daily as needed for hemorrhoids or anal itching. What changed:  when to take this reasons to take this   magnesium oxide 400 (241.3 Mg) MG tablet Commonly known as: MAG-OX Take 400 mg by mouth daily.   ondansetron 4 MG tablet Commonly known as: Zofran Take 1 tablet (4 mg total) by mouth every 6 (six) hours as needed for nausea or vomiting.   OneTouch Delica Lancets 28J Misc Use as directed to check blood sugars before breakfast and dinner dx: e11.65 What changed:  how much to take how to take this when to take this   pantoprazole 40 MG tablet Commonly known as: Protonix Take 1 tablet (40 mg total) by mouth daily.   rosuvastatin 20 MG tablet Commonly known as: CRESTOR Take 1 tablet (20 mg total) by mouth daily.   sevelamer carbonate 800 MG tablet Commonly known as: RENVELA Take 1 tablet (800 mg total) by mouth 3 (three) times daily with meals.   Symbicort 160-4.5 MCG/ACT inhaler Generic drug: budesonide-formoterol Inhale 2 puffs into the lungs 2 (two) times daily.   traMADol 50 MG tablet Commonly known as: ULTRAM Take 1 tablet (50 mg total) by  mouth every 12 (twelve) hours as needed for severe pain.   vitamin C 500 MG tablet Commonly known as: ASCORBIC ACID Take 500 mg by mouth daily.   VITAMIN D3 PO Take 2,000 Units by mouth daily.               Discharge Care Instructions  (From admission, onward)           Start     Ordered   04/20/21 0000  No dressing needed        04/20/21 1109            Follow-up Information     [facility]. Schedule an appointment as soon as possible for a visit in 1 month(s).   Specialty: Neurology Why: stroke clinic Contact information: [address] [place] [phone]        [name], MD. Schedule an appointment as soon as possible for a visit in 1 week(s).   Specialty: Internal Medicine Contact information: [address] [place] [address] [phone]         Belva Crome, MD Follow up in 1 month(s).   Specialty: Cardiology Contact information: [address] [place] [address] [phone]                Allergies  Allergen Reactions   Food Anaphylaxis    Peanuts; Almonds   Other Shortness Of Breath    Peanuts; Almonds   Wheat Bran Shortness Of Breath   Statins Itching and Other (See Comments)    Generalized aches   Iodine Other (See Comments)    The patient denied this allergy to me on 03/16/2021   Shellfish Allergy Other (See Comments)    Mouth gets raw   Sitagliptin     Other reaction(s): Unknown   Tetracycline Other (See Comments)    Raw mouth   Tetracyclines & Related Itching   Contrast Media [Iodinated Diagnostic Agents] Rash    Happened during CT scan over 30 years ago      Consultations:  GI, nephrology, and neurology   Other Procedures/Studies: DG Chest 2 View  Result Date: 03/23/2021 CLINICAL DATA:  Pneumonia, asthma EXAM: CHEST - 2 VIEW COMPARISON:  03/21/2021 FINDINGS: Right IJ approach hemodialysis catheter  terminates at the level of the right atrium. Stable cardiomegaly. Low lung volumes. Patchy left basilar opacity, not appreciably changed from prior. No large pleural fluid collection. No pneumothorax. IMPRESSION: Patchy left basilar opacity, not appreciably changed from prior. Electronically Signed   By: Davina Poke D.O.   On: 03/23/2021 13:42   CT HEAD WO CONTRAST  Result Date: 04/18/2021 CLINICAL DATA:  Follow-up stroke. EXAM: CT HEAD WITHOUT CONTRAST TECHNIQUE: Contiguous axial images were obtained from the base of the skull through the vertex without intravenous contrast. COMPARISON:  CT scan April 14, 2021.  MRI of the head April 16, 2021. FINDINGS: Brain: No subdural, epidural, or subarachnoid hemorrhage. No mass effect or midline shift. Ventricles and sulci are unchanged. The known right basal ganglia infarct is more pronounced on CT imaging in the interval seen on series 4, image 17 with decreased attenuation. No associated hemorrhage. White matter changes otherwise stable. Cerebellum, brainstem, and basal cisterns are normal. No other evidence of acute infarct or ischemia. Vascular: Calcified atherosclerosis in the intracranial carotids. Skull: Normal. Negative for fracture or focal lesion. Sinuses/Orbits: No acute finding. Other: No other abnormalities. IMPRESSION: 1. The right basal ganglia infarct is better visualized on today's CT compared to the April 14, 2021 CT due to group decreasing attenuation. No associated hemorrhage identified. No other significant interval changes. Electronically Signed   By: Dorise Bullion III M.D   On: 04/18/2021 13:04   CT HEAD WO CONTRAST  Result Date: 04/14/2021 CLINICAL DATA:  Possible stroke.  LEFT-sided weakness EXAM: CT HEAD WITHOUT CONTRAST TECHNIQUE: Contiguous axial images were obtained from the base of the skull through the vertex without intravenous contrast. COMPARISON:  None. FINDINGS: Brain: No acute intracranial hemorrhage. No focal mass lesion. No  CT evidence of acute infarction. No midline shift or mass effect. No hydrocephalus. Basilar cisterns are patent. There are periventricular and subcortical white matter hypodensities. Generalized cortical atrophy. Vascular: No hyperdense vessel or unexpected calcification. Skull: Normal. Negative for fracture or focal lesion. Sinuses/Orbits: Paranasal sinuses and mastoid air cells are clear. Orbits are clear. Other: None. IMPRESSION: 1. No acute intracranial findings. 2. Atrophy and white matter microvascular disease. Electronically Signed   By: Suzy Bouchard M.D.   On: 04/14/2021 18:58   MR ANGIO HEAD WO CONTRAST  Result Date: 04/17/2021 CLINICAL DATA:  Initial evaluation for acute stroke. EXAM: MRA NECK WITHOUT CONTRAST MRA HEAD WITHOUT CONTRAST TECHNIQUE: Multiplanar and multiecho pulse sequences of the neck were obtained without intravenous contrast. Angiographic images of the neck were obtained using MRA technique without and with intravenous contrast; Angiographic images of the Circle of Willis were obtained using MRA technique without intravenous contrast. COMPARISON:  Prior MRI from 04/16/2021. FINDINGS: MRA NECK FINDINGS AORTIC ARCH: Examination severely degraded by motion artifact. Partially visualized aortic arch grossly normal in caliber with normal branch pattern. No appreciable stenosis about the origin of the great vessels. RIGHT CAROTID SYSTEM: Visualized right CCA patent without stenosis. No appreciable stenosis or significant irregularity about the right carotid bifurcation. Visualized right ICA patent without stenosis or occlusion. LEFT CAROTID SYSTEM: Visualized left CCA patent without stenosis. No significant atheromatous narrowing or irregularity about the left bifurcation. Left ICA patent without stenosis or occlusion. VERTEBRAL ARTERIES: Origins of the vertebral arteries not well assessed on this exam. Visualized portions of the vertebral arteries patent with antegrade flow, with no  appreciable stenosis or occlusion. MRA HEAD FINDINGS ANTERIOR CIRCULATION: Examination moderately degraded by motion artifact. Visualized distal cervical segments of the internal carotid arteries  are patent with antegrade flow. Petrous, cavernous, and supraclinoid segments patent without stenosis or other abnormality. A1 segments patent. Normal anterior communicating artery complex. Anterior cerebral arteries grossly patent to their distal aspects without appreciable stenosis. M1 segments patent bilaterally. Ill-defined attenuation involving the distal left M1 segment and proximal M2 branches favored to be artifactual on this exam. No definite proximal stenosis. MCA branches well perfused and symmetric. POSTERIOR CIRCULATION: Vertebral arteries codominant and patent to the vertebrobasilar junction without stenosis. Both PICA origins patent and normal. Basilar patent to its distal aspect without stenosis. Superior cerebellar arteries patent bilaterally. Both PCA supplied via the basilar as well as prominent bilateral posterior communicating arteries. PCAs patent to their distal aspects without definite proximal stenosis. No visible aneurysm or other vascular abnormality. IMPRESSION: 1. Technically limited exam due to extensive motion artifact. 2. Grossly negative MRA of the head and neck. No large vessel occlusion. No proximal high-grade or correctable stenosis. Electronically Signed   By: Jeannine Boga M.D.   On: 04/17/2021 03:36   MR ANGIO NECK WO CONTRAST  Result Date: 04/17/2021 CLINICAL DATA:  Initial evaluation for acute stroke. EXAM: MRA NECK WITHOUT CONTRAST MRA HEAD WITHOUT CONTRAST TECHNIQUE: Multiplanar and multiecho pulse sequences of the neck were obtained without intravenous contrast. Angiographic images of the neck were obtained using MRA technique without and with intravenous contrast; Angiographic images of the Circle of Willis were obtained using MRA technique without intravenous  contrast. COMPARISON:  Prior MRI from 04/16/2021. FINDINGS: MRA NECK FINDINGS AORTIC ARCH: Examination severely degraded by motion artifact. Partially visualized aortic arch grossly normal in caliber with normal branch pattern. No appreciable stenosis about the origin of the great vessels. RIGHT CAROTID SYSTEM: Visualized right CCA patent without stenosis. No appreciable stenosis or significant irregularity about the right carotid bifurcation. Visualized right ICA patent without stenosis or occlusion. LEFT CAROTID SYSTEM: Visualized left CCA patent without stenosis. No significant atheromatous narrowing or irregularity about the left bifurcation. Left ICA patent without stenosis or occlusion. VERTEBRAL ARTERIES: Origins of the vertebral arteries not well assessed on this exam. Visualized portions of the vertebral arteries patent with antegrade flow, with no appreciable stenosis or occlusion. MRA HEAD FINDINGS ANTERIOR CIRCULATION: Examination moderately degraded by motion artifact. Visualized distal cervical segments of the internal carotid arteries are patent with antegrade flow. Petrous, cavernous, and supraclinoid segments patent without stenosis or other abnormality. A1 segments patent. Normal anterior communicating artery complex. Anterior cerebral arteries grossly patent to their distal aspects without appreciable stenosis. M1 segments patent bilaterally. Ill-defined attenuation involving the distal left M1 segment and proximal M2 branches favored to be artifactual on this exam. No definite proximal stenosis. MCA branches well perfused and symmetric. POSTERIOR CIRCULATION: Vertebral arteries codominant and patent to the vertebrobasilar junction without stenosis. Both PICA origins patent and normal. Basilar patent to its distal aspect without stenosis. Superior cerebellar arteries patent bilaterally. Both PCA supplied via the basilar as well as prominent bilateral posterior communicating arteries. PCAs patent  to their distal aspects without definite proximal stenosis. No visible aneurysm or other vascular abnormality. IMPRESSION: 1. Technically limited exam due to extensive motion artifact. 2. Grossly negative MRA of the head and neck. No large vessel occlusion. No proximal high-grade or correctable stenosis. Electronically Signed   By: Jeannine Boga M.D.   On: 04/17/2021 03:36   MR BRAIN WO CONTRAST  Result Date: 04/16/2021 CLINICAL DATA:  Initial evaluation for acute stroke. EXAM: MRI HEAD WITHOUT CONTRAST TECHNIQUE: Multiplanar, multiecho pulse sequences of the brain and surrounding  structures were obtained without intravenous contrast. COMPARISON:  Prior head CT from 04/14/2021. FINDINGS: Brain: Cerebral volume within normal limits for age. Patchy and hazy T2/FLAIR hyperintensity within the periventricular deep white matter both cerebral hemispheres most consistent with chronic small vessel ischemic disease, moderately advanced in nature. 2.1 cm focus of restricted diffusion seen involving the right basal ganglia, consistent with an acute ischemic infarct. No associated hemorrhage or mass effect. No other evidence for acute or subacute ischemia. Gray-white matter differentiation otherwise maintained. No other areas of acute or subacute infarction. No encephalomalacia to suggest chronic cortical infarction. No foci of susceptibility artifact to suggest acute or chronic intracranial hemorrhage. No mass lesion, midline shift or mass effect. No hydrocephalus or extra-axial fluid collection. Pituitary gland within normal limits. Suprasellar region normal. Midline structures intact. Vascular: Major intracranial vascular flow voids are maintained. Skull and upper cervical spine: Craniocervical junction within normal limits. Bone marrow signal intensity normal. No scalp soft tissue abnormality. Sinuses/Orbits: Patient status post bilateral ocular lens replacement. Globes and orbital soft tissues demonstrate no  acute finding. Mild scattered mucosal thickening noted within the ethmoidal air cells. Paranasal sinuses are otherwise clear. No mastoid effusion. Inner ear structures grossly normal. Other: None. IMPRESSION: 1. 2.1 cm acute ischemic nonhemorrhagic right basal ganglia infarct. 2. Underlying moderate chronic microvascular ischemic disease. Electronically Signed   By: Jeannine Boga M.D.   On: 04/16/2021 21:16   DG CHEST PORT 1 VIEW  Result Date: 04/16/2021 CLINICAL DATA:  Stroke. EXAM: PORTABLE CHEST 1 VIEW COMPARISON:  Chest x-ray dated Mar 23, 2021. FINDINGS: Unchanged tunneled right internal jugular dialysis catheter. Stable cardiomegaly. Unchanged mild left basilar atelectasis. No focal consolidation, pleural effusion, or pneumothorax. No acute osseous abnormality. IMPRESSION: No active disease. Electronically Signed   By: Titus Dubin M.D.   On: 04/16/2021 20:32   DG CHEST PORT 1 VIEW  Result Date: 03/21/2021 CLINICAL DATA:  Recent aspiration EXAM: PORTABLE CHEST 1 VIEW COMPARISON:  03/10/2021 FINDINGS: Cardiac shadow is enlarged but stable. New right jugular dialysis catheter is noted in satisfactory position. Right lung is clear. Left lung demonstrates some mild basilar opacity increased when compared with the prior exam. No bony abnormality is noted. IMPRESSION: Increasing left basilar airspace opacity consistent with focal infiltrate. Electronically Signed   By: Inez Catalina M.D.   On: 03/21/2021 20:22     TODAY-DAY OF DISCHARGE:  Subjective:   Eron Bole today has no headache,no chest abdominal pain,no new weakness tingling or numbness, feels much better wants to go home today.   Objective:   Blood pressure (!) 159/68, pulse 85, temperature 98 F (36.7 C), temperature source Oral, resp. rate 17, height 5\' 2"  (1.575 m), weight 85.1 kg, SpO2 98 %.  Intake/Output Summary (Last 24 hours) at 04/20/2021 1110 Last data filed at 04/20/2021 0105 Gross per 24 hour  Intake 480 ml   Output 1500 ml  Net -1020 ml   Filed Weights   04/18/21 1650 04/19/21 2100 04/20/21 0105  Weight: 83.9 kg 86.3 kg 85.1 kg    Exam: Awake Alert, Oriented *3, No new F.N deficits, Normal affect [place].AT,PERRAL Supple Neck,No JVD, No cervical lymphadenopathy appriciated.  Symmetrical Chest wall movement, Good air movement bilaterally, CTAB RRR,No Gallops,Rubs or new Murmurs, No Parasternal Heave +ve B.Sounds, Abd Soft, Non tender, No organomegaly appriciated, No rebound -guarding or rigidity. No Cyanosis, Clubbing or edema, No new Rash or bruise   PERTINENT RADIOLOGIC STUDIES: CT HEAD WO CONTRAST  Result Date: 04/18/2021 CLINICAL DATA:  Follow-up stroke.  EXAM: CT HEAD WITHOUT CONTRAST TECHNIQUE: Contiguous axial images were obtained from the base of the skull through the vertex without intravenous contrast. COMPARISON:  CT scan April 14, 2021.  MRI of the head April 16, 2021. FINDINGS: Brain: No subdural, epidural, or subarachnoid hemorrhage. No mass effect or midline shift. Ventricles and sulci are unchanged. The known right basal ganglia infarct is more pronounced on CT imaging in the interval seen on series 4, image 17 with decreased attenuation. No associated hemorrhage. White matter changes otherwise stable. Cerebellum, brainstem, and basal cisterns are normal. No other evidence of acute infarct or ischemia. Vascular: Calcified atherosclerosis in the intracranial carotids. Skull: Normal. Negative for fracture or focal lesion. Sinuses/Orbits: No acute finding. Other: No other abnormalities. IMPRESSION: 1. The right basal ganglia infarct is better visualized on today's CT compared to the April 14, 2021 CT due to group decreasing attenuation. No associated hemorrhage identified. No other significant interval changes. Electronically Signed   By: Dorise Bullion III M.D   On: 04/18/2021 13:04     PERTINENT LAB RESULTS: CBC: Recent Labs    04/18/21 1006 04/18/21 1621 04/20/21 0405  04/20/21 1002  WBC 12.9*  --   --  13.7*  HGB 9.9*   < > 9.6* 9.8*  HCT 29.2*   < > 28.1* 29.4*  PLT 233  --   --  246   < > = values in this interval not displayed.   CMET CMP     Component Value Date/Time   NA 140 04/19/2021 0413   NA 145 (H) 02/17/2021 1146   K 3.8 04/19/2021 0413   CL 99 04/19/2021 0413   CO2 28 04/19/2021 0413   GLUCOSE 87 04/19/2021 0413   BUN 30 (H) 04/19/2021 0413   BUN 52 (H) 02/17/2021 1146   CREATININE 6.39 (H) 04/19/2021 0413   CREATININE 0.84 10/13/2011 0903   CALCIUM 8.7 (L) 04/19/2021 0413   PROT 6.9 04/14/2021 1824   PROT 6.3 02/17/2021 1146   ALBUMIN 3.1 (L) 04/17/2021 0224   ALBUMIN 3.3 (L) 02/17/2021 1146   AST 27 04/14/2021 1824   ALT 21 04/14/2021 1824   ALKPHOS 59 04/14/2021 1824   BILITOT 0.7 04/14/2021 1824   BILITOT 0.2 02/17/2021 1146   GFRNONAA 6 (L) 04/19/2021 0413   GFRNONAA 75 10/13/2011 0903   GFRAA 17 06/19/2020 0000   GFRAA 86 10/13/2011 0903    GFR Estimated Creatinine Clearance: 8.1 mL/min (A) (by C-G formula based on SCr of 6.39 mg/dL (H)). No results for input(s): LIPASE, AMYLASE in the last 72 hours. No results for input(s): CKTOTAL, CKMB, CKMBINDEX, TROPONINI in the last 72 hours. Invalid input(s): POCBNP No results for input(s): DDIMER in the last 72 hours. No results for input(s): HGBA1C in the last 72 hours. No results for input(s): CHOL, HDL, LDLCALC, TRIG, CHOLHDL, LDLDIRECT in the last 72 hours. No results for input(s): TSH, T4TOTAL, T3FREE, THYROIDAB in the last 72 hours.  Invalid input(s): FREET3 No results for input(s): VITAMINB12, FOLATE, FERRITIN, TIBC, IRON, RETICCTPCT in the last 72 hours. Coags: No results for input(s): INR in the last 72 hours.  Invalid input(s): PT Microbiology: Recent Results (from the past 240 hour(s))  Resp Panel by RT-PCR (Flu A&B, Covid) Nasopharyngeal Swab     Status: None   Collection Time: 04/16/21 12:52 PM   Specimen: Nasopharyngeal Swab; Nasopharyngeal(NP) swabs  in vial transport medium  Result Value Ref Range Status   SARS Coronavirus 2 by RT PCR NEGATIVE NEGATIVE Final  Comment: (NOTE) SARS-CoV-2 target nucleic acids are NOT DETECTED.  The SARS-CoV-2 RNA is generally detectable in upper respiratory specimens during the acute phase of infection. The lowest concentration of SARS-CoV-2 viral copies this assay can detect is 138 copies/mL. A negative result does not preclude SARS-Cov-2 infection and should not be used as the sole basis for treatment or other patient management decisions. A negative result may occur with  improper specimen collection/handling, submission of specimen other than nasopharyngeal swab, presence of viral mutation(s) within the areas targeted by this assay, and inadequate number of viral copies(<138 copies/mL). A negative result must be combined with clinical observations, patient history, and epidemiological information. The expected result is Negative.  Fact Sheet for Patients:  EntrepreneurPulse.com.au  Fact Sheet for Healthcare Providers:  IncredibleEmployment.be  This test is no t yet approved or cleared by the [place] FDA and  has been authorized for detection and/or diagnosis of SARS-CoV-2 by FDA under an Emergency Use Authorization (EUA). This EUA will remain  in effect (meaning this test can be used) for the duration of the COVID-19 declaration under Section 564(b)(1) of the Act, 21 U.S.C.section 360bbb-3(b)(1), unless the authorization is terminated  or revoked sooner.       Influenza A by PCR NEGATIVE NEGATIVE Final   Influenza B by PCR NEGATIVE NEGATIVE Final    Comment: (NOTE) The Xpert Xpress SARS-CoV-2/FLU/RSV plus assay is intended as an aid in the diagnosis of influenza from Nasopharyngeal swab specimens and should not be used as a sole basis for treatment. Nasal washings and aspirates are unacceptable for Xpert Xpress  SARS-CoV-2/FLU/RSV testing.  Fact Sheet for Patients: EntrepreneurPulse.com.au  Fact Sheet for Healthcare Providers: IncredibleEmployment.be  This test is not yet approved or cleared by the [place] FDA and has been authorized for detection and/or diagnosis of SARS-CoV-2 by FDA under an Emergency Use Authorization (EUA). This EUA will remain in effect (meaning this test can be used) for the duration of the COVID-19 declaration under Section 564(b)(1) of the Act, 21 U.S.C. section 360bbb-3(b)(1), unless the authorization is terminated or revoked.  Performed at [facility], [place] [address] [place], [place] [address]     FURTHER DISCHARGE INSTRUCTIONS:  Get Medicines reviewed and adjusted: Please take all your medications with you for your next visit with your Primary MD  Laboratory/radiological data: Please request your Primary MD to go over all hospital tests and procedure/radiological results at the follow up, please ask your Primary MD to get all Hospital records sent to his/her office.  In some cases, they will be blood work, cultures and biopsy results pending at the time of your discharge. Please request that your primary care M.D. goes through all the records of your hospital data and follows up on these results.  Also Note the following: If you experience worsening of your admission symptoms, develop shortness of breath, life threatening emergency, suicidal or homicidal thoughts you must seek medical attention immediately by calling 911 or calling your MD immediately  if symptoms less severe.  You must read complete instructions/literature along with all the possible adverse reactions/side effects for all the Medicines you take and that have been prescribed to you. Take any new Medicines after you have completely understood and accpet all the possible adverse reactions/side effects.   Do not drive when taking Pain medications  or sleeping medications (Benzodaizepines)  Do not take more than prescribed Pain, Sleep and Anxiety Medications. It is not advisable to combine anxiety,sleep and pain medications without talking  with your primary care practitioner  Special Instructions: If you have smoked or chewed Tobacco  in the last 2 yrs please stop smoking, stop any regular Alcohol  and or any Recreational drug use.  Wear Seat belts while driving.  Please note: You were cared for by a hospitalist during your hospital stay. Once you are discharged, your primary care physician will handle any further medical issues. Please note that NO REFILLS for any discharge medications will be authorized once you are discharged, as it is imperative that you return to your primary care physician (or establish a relationship with a primary care physician if you do not have one) for your post hospital discharge needs so that they can reassess your need for medications and monitor your lab values.  Total Time spent coordinating discharge including counseling, education and face to face time equals 35 minutes.  SignedOren Binet 04/20/2021 11:10 AM

## 2021-04-20 NOTE — PMR Pre-admission (Signed)
PMR Admission Coordinator Pre-Admission Assessment  Patient: Carolyn Russo is an 70 y.o., female MRN: [id number] DOB: 28-Jun-1951 Height: '5\' 2"'  (1.575 m) Weight: 85.1 kg  Insurance Information HMO:     PPO:      PCP:      IPA:      80/20: yes    OTHER:  PRIMARY: Medicare Russo/B       Policy#: [id number]      Subscriber: Pt Phone#: Verified online    Fax#:  Pre-Cert#:       Employer:  Benefits:  Phone #:      Name:  Eff. Date: [employer] Russo ad B effective 10/24/2013  Deduct: $4403      Out of Pocket Max:  None      Life Max: N/Russo  CIR: 100%      SNF: 100 days Outpatient: 80%     Co-Pay: 20% Home Health: 100%      Co-Pay: none DME: 80%     Co-Pay: 20% Providers: patient's choice SECONDARY: [place]      Policy#: [id number]      Phone#:   Financial Counselor:        Phone#:   The "Data Collection Information Summary" for patients in Inpatient Rehabilitation Facilities with attached "Privacy Act [place] Records" was provided and verbally reviewed with: Patient  Emergency Contact Information Contact Information     Name Relation Home Work Mobile   Hewins,Dr [place]. Spouse [phone]  [phone]   Carolyn Russo Son [phone]         Current Medical History  Patient Admitting Diagnosis: CVA  History of Present Illness: Carolyn Russo is Russo 70 year old right-handed female with history significant for hypertension, obesity with BMI 34.31, hyperlipidemia, newly diagnosed end-stage renal disease on hemodialysis Monday Wednesday Friday, diabetes mellitus, CAD maintained on aspirin, iron deficiency anemia and asthma.  Patient with latest admission 03/09/2021 to 03/24/2021 for acute on chronic diastolic congestive heart failure exacerbation.  Per chart review patient lives with spouse.  Two-level home bed and bath main level 4 steps to entry.  Independent with cane prior to admission.  Presented 04/16/2021 with left-sided weakness x3 days and slurred speech with  bouts of vomiting.  It was noted patient had initially presented to the ER couple days prior to the latest admission with complaints of left-sided weakness but left without being formally evaluated.  Cranial CT scan showed no acute abnormalities.  Patient did not receive tPA.  MRI of the brain showed Russo 2.1 cm acute ischemic nonhemorrhagic right basal ganglia infarction.  MRA angiogram of head and neck grossly negative no large vessel occlusion or high-grade stenosis.  Most recent echocardiogram with ejection fraction of 20% grade 2 diastolic dysfunction.  Admission chemistries hemoglobin 8.8-9.0, chemistries unremarkable sent creatinine 3.58, hemoglobin A1c 5.2.  Hospital course gastroenterology Dr. Carlean Purl consulted 04/18/2021 after patient had multiple large bloody bowel movements with clots.  Underwent upper endoscopy 04/18/2021 showing normal esophagus and stomach and the cardia and gastric fundus normal on retroflexion and also underwent flexible sigmoidoscopy showing hemorrhoids found on perianal exam external/internal hemorrhoids.  Patient had initially been placed on aspirin and Plavix for CVA prophylaxis held due to suspected lower GI bleed awaiting plan on resuming anticoagulation.  Follow-up renal services with hemodialysis ongoing as directed.  Currently on Russo clear liquid diet advanced as tolerated.  Due to patient decreased functional ability left side weakness was admitted for Russo comprehensive rehab program.   Complete NIHSS TOTAL:  3  Patient's medical record from Carolyn Russo has been reviewed by the rehabilitation admission coordinator and physician.  Past Medical History  Past Medical History:  Diagnosis Date   Allergy    Anterior chest wall pain    Appendicitis 1965   Asthma    Body mass index 37.0-37.9, adult    Breast pain    Cataract    both eyes   Chronic renal disease, stage II    Dehydration 2014   Deviated septum 1971   Diabetes mellitus    Dyspnea 2014    Extrinsic asthma    WITH ASTHMA ATTACK   Fibroid 1980   GERD (gastroesophageal reflux disease)    Heart murmur    Hx gestational diabetes    Hyperlipidemia    Hypertension 2014   Inguinal hernia 1959   Malaise and fatigue 2014   Nephropathy    Non-IgE mediated allergic asthma 2014   Obesity    Pelvic pain    Pregnancy, high-risk 1985   Tonsillitis 1968   Uterine fibroid 1980    Family History   family history includes Alzheimer's disease in her father; Cancer in her cousin and mother; Diabetes in her cousin and maternal aunt; Psoriasis in her mother.  Prior Rehab/Hospitalizations Has the patient had prior rehab or hospitalizations prior to admission? No  Has the patient had major surgery during 100 days prior to admission? Yes  Current Medications  Current Facility-Administered Medications:    acetaminophen (TYLENOL) tablet 650 mg, 650 mg, Oral, Q4H PRN, 650 mg at 04/16/21 2001 **OR** acetaminophen (TYLENOL) 160 MG/5ML solution 650 mg, 650 mg, Per Tube, Q4H PRN **OR** acetaminophen (TYLENOL) suppository 650 mg, 650 mg, Rectal, Q4H PRN, Carolyn Julian, Rondell A, MD   albuterol (PROVENTIL) (2.5 MG/3ML) 0.083% nebulizer solution 2.5 mg, 2.5 mg, Nebulization, Q4H PRN, Carolyn Julian, Rondell A, MD, 2.5 mg at 04/18/21 5809   ALPRAZolam Duanne Moron) tablet 0.5 mg, 0.5 mg, Oral, TID PRN, Arrien, Jimmy Picket, MD, 0.5 mg at 04/17/21 1831   arformoterol (BROVANA) nebulizer solution 15 mcg, 15 mcg, Nebulization, BID, Carolyn Julian, Rondell A, MD, 15 mcg at 04/20/21 0932   budesonide (PULMICORT) nebulizer solution 0.5 mg, 0.5 mg, Nebulization, BID, Carolyn Julian, Rondell A, MD, 0.5 mg at 04/20/21 0933   carvedilol (COREG) tablet 3.125 mg, 3.125 mg, Oral, BID WC, Smith, Rondell A, MD, 3.125 mg at 04/20/21 9833   Chlorhexidine Gluconate Cloth 2 % PADS 6 each, 6 each, Topical, Daily, Carolyn Plan A, MD, 6 each at 04/20/21 0958   docusate sodium (COLACE) capsule 100 mg, 100 mg, Oral, Daily PRN, Carolyn Julian, Rondell A, MD    fluticasone (FLONASE) 50 MCG/ACT nasal spray 2 spray, 2 spray, Each Nare, Daily PRN, Carolyn Julian, Rondell A, MD   furosemide (LASIX) tablet 40-80 mg, 40-80 mg, Oral, Q M,W,F, Smith, Rondell A, MD, 40 mg at 04/19/21 0948   hydrALAZINE (APRESOLINE) injection 5 mg, 5 mg, Intravenous, Q4H PRN, Elgergawy, Silver Huguenin, MD, 5 mg at 04/18/21 2338   hydrocortisone (ANUSOL-HC) 2.5 % rectal cream 1 application, 1 application, Rectal, BID PRN, Carolyn Plan A, MD, 1 application at 82/50/53 0949   hydrocortisone (ANUSOL-HC) suppository 25 mg, 25 mg, Rectal, BID, Smith, Rondell A, MD, 25 mg at 04/20/21 0957   ondansetron (ZOFRAN) 4 MG/2ML injection, , , ,    ondansetron (ZOFRAN) 8 mg in sodium chloride 0.9 % 50 mL IVPB, 8 mg, Intravenous, Q6H PRN, Metzger-Cihelka, Desiree, NP   pantoprazole (PROTONIX) injection 40 mg, 40 mg, Intravenous, Q12H, Esterwood, Amy  S, PA-C, 40 mg at 04/20/21 1517   rosuvastatin (CRESTOR) tablet 20 mg, 20 mg, Oral, Daily, Smith, Rondell A, MD, 20 mg at 04/20/21 6160   senna-docusate (Senokot-S) tablet 1 tablet, 1 tablet, Oral, QHS PRN, Carolyn Julian, Rondell A, MD   sevelamer carbonate (RENVELA) tablet 800 mg, 800 mg, Oral, TID WC, Smith, Rondell A, MD, 800 mg at 04/20/21 0958   traMADol (ULTRAM) tablet 50 mg, 50 mg, Oral, Q12H PRN, Carolyn Plan A, MD, 50 mg at 04/20/21 7371  Patients Current Diet:  Diet Order             DIET SOFT Room service appropriate? Yes; Fluid consistency: Thin  Diet effective now                   Precautions / Restrictions Precautions Precautions: Fall Restrictions Weight Bearing Restrictions: No   Has the patient had 2 or more falls or Russo fall with injury in the past year? No  Prior Activity Level Limited Community (1-2x/wk): Pt. was active in the community prior to admission  Prior Functional Level Self Care: Did the patient need help bathing, dressing, using the toilet or eating? Independent  Indoor Mobility: Did the patient need assistance with  walking from room to room (with or without device)? Independent  Stairs: Did the patient need assistance with internal or external stairs (with or without device)? Independent  Functional Cognition: Did the patient need help planning regular tasks such as shopping or remembering to take medications? Independent  Home Assistive Devices / Equipment Home Equipment: Cane - single point, Bedside commode, Walker - 4 wheels (has BSC over toilet to incr height; rollator is pt's husband that he does not use (uses cane))  Prior Device Use: Indicate devices/aids used by the patient prior to current illness, exacerbation or injury? None of the above  Current Functional Level Cognition  Overall Cognitive Status: Difficult to assess Difficult to assess due to: Level of arousal Orientation Level: Oriented X4 General Comments: Russo&Ox4; followed multi-step commands; decreased awareness of need for safety. Attention: Focused, Sustained Focused Attention: Appears intact Sustained Attention: Appears intact Memory: Impaired Memory Impairment: Decreased short term memory Decreased Short Term Memory: Verbal basic Problem Solving: Appears intact Executive Function: Reasoning Reasoning: Appears intact    Extremity Assessment (includes Sensation/Coordination)  Upper Extremity Assessment: LUE deficits/detail LUE Deficits / Details: R hemi; shoulder flex/abd ~50 degrees; painful elbow flexion 2/2 IV site; 3/5 deltoid, 3+/5 bicep, 3/5 tricep, 4/5 gross grasp LUE Sensation: decreased light touch LUE Coordination: decreased fine motor, decreased gross motor  Lower Extremity Assessment: Defer to PT evaluation    ADLs  Overall ADL's : Needs assistance/impaired Lower Body Dressing: Maximal assistance, Sit to/from stand Lower Body Dressing Details (indicate cue type and reason): Max Russo to don footwear seated EOB. Patient states husband assists PRN with donning footwear. Toilet Transfer: Minimal Corporate investment banker Details (indicate cue type and reason): Simulated with transfer to recliner with increased time/effort and Min Russo. Postural instability. Functional mobility during ADLs: Minimal assistance General ADL Comments: Patient limited by RUE weakness, decreased static/dynamic standing balance and decreased safety awareness.    Mobility  Overal bed mobility: Needs Assistance Bed Mobility: Rolling, Sidelying to Sit Rolling: Supervision Sidelying to sit: Min guard General bed mobility comments: pt sitting at EOB on arrival; drifting off to sleep and leaning to her left    Transfers  Overall transfer level: Needs assistance Equipment used: Rolling walker (2 wheeled) Transfers: Sit to/from Stand Sit to  Stand: Min assist Stand pivot transfers: Min assist General transfer comment: vc for hand placement with use of RW; steadying assist due to postural instability    Ambulation / Gait / Stairs / Wheelchair Mobility  Ambulation/Gait Ambulation/Gait assistance: Min assist, +2 physical assistance Gait Distance (Feet): 15 Feet (seated rest x 4 minutes; 15 ft) Assistive device: Rolling walker (2 wheeled) Gait Pattern/deviations: Step-through pattern, Decreased stride length, Decreased weight shift to right, Decreased dorsiflexion - left General Gait Details: unable due to lethargy; stood x 3 minutes for changing undergarment and then too fatigued/lethargic to walk    Posture / Balance Balance Overall balance assessment: Needs assistance Sitting-balance support: Feet supported Sitting balance-Leahy Scale: Fair Standing balance support: Bilateral upper extremity supported, During functional activity Standing balance-Leahy Scale: Poor Standing balance comment: needs UE support    Special needs/care consideration Dialysis: Hemodialysis Monday, Wednesday, and Friday    Previous Home Environment (from acute therapy documentation) Living Arrangements: Spouse/significant other  Lives With:  Spouse Available Help at Discharge: Family Type of Home: House Home Layout: Two level, Able to live on main level with bedroom/bathroom Home Access: Stairs to enter Entrance Stairs-Rails: Right Entrance Stairs-Number of Steps: 4 Bathroom Shower/Tub: Multimedia programmer: Standard Bathroom Accessibility: Yes How Accessible: Accessible via wheelchair, Accessible via [place]: No  Discharge Living Setting Plans for Discharge Living Setting: Patient's home Type of Home at Discharge: House Discharge Home Layout: Able to live on main level with bedroom/bathroom, Two level Alternate Level Stairs-Rails: Right Alternate Level Stairs-Number of Steps: full flight Discharge Home Access: Stairs to enter Entrance Stairs-Rails: Right Entrance Stairs-Number of Steps: 4 Discharge Bathroom Shower/Tub: Walk-in shower Discharge Bathroom Toilet: Standard Discharge Bathroom Accessibility: Yes How Accessible: Accessible via walker Does the patient have any problems obtaining your medications?: Yes (Describe)  Social/Family/Support Systems Patient Roles: Spouse Contact Information: [phone] Anticipated Caregiver: Dr. Marylynn Pearson Anticipated Caregiver's Contact Information: [phone] Ability/Limitations of Caregiver: Can provide Min Russo Caregiver Availability: 24/7 (Will rotate family, hire assistance if needed) Discharge Plan Discussed with Primary Caregiver: Yes Is Caregiver In Agreement with Plan?: Yes Does Caregiver/Family have Issues with Lodging/Transportation while Pt is in Rehab?: No  Goals Patient/Family Goal for Rehab: PT/OT/SLP Mod I Expected length of stay: 10-12 days Pt/Family Agrees to Admission and willing to participate: Yes Program Orientation Provided & Reviewed with Pt/Caregiver Including Roles  & Responsibilities: Yes  Decrease burden of Care through IP rehab admission: Specialzed equipment needs, Diet advancement, Decrease number of  caregivers, and Patient/family education  Possible need for SNF placement upon discharge: Not anticipated   Patient Condition: I have reviewed medical records from [facility], spoken with CM, and patient and spouse. I met with patient at the bedside for inpatient rehabilitation assessment.  Patient will benefit from ongoing PT, OT, and SLP, can actively participate in 3 hours of therapy Russo day 5 days of the week, and can make measurable gains during the admission.  Patient will also benefit from the coordinated team approach during an Inpatient Acute Rehabilitation admission.  The patient will receive intensive therapy as well as Rehabilitation physician, nursing, social worker, and care management interventions.  Due to bladder management, bowel management, safety, disease management, medication administration, pain management, and patient education the patient requires 24 hour Russo day rehabilitation nursing.  The patient is currently min Russo with mobility and basic ADLs.  Discharge setting and therapy post discharge at home with home health is anticipated.  Patient has agreed to participate in  the Acute Inpatient Rehabilitation Program and will admit today.  Preadmission Screen Completed By:  Genella Mech, 04/20/2021 10:15 AM ______________________________________________________________________   Discussed status with Dr. Naaman Plummer on 04/20/21  at 54  and received approval for admission today.  Admission Coordinator:  Genella Mech, CCC-SLP, time 1021/Date 04/20/21   Assessment/Plan: Diagnosis: right basal ganglia infarct Does the need for close, 24 hr/day Medical supervision in concert with the patient's rehab needs make it unreasonable for this patient to be served in Russo less intensive setting? Yes Co-Morbidities requiring supervision/potential complications: HTN, morbid obesity, ESRD on HD, GIB, CAD Due to bowel management, safety, skin/wound care, disease management, medication  administration, pain management, and patient education, does the patient require 24 hr/day rehab nursing? Yes Does the patient require coordinated care of Russo physician, rehab nurse, PT, OT, and SLP to address physical and functional deficits in the context of the above medical diagnosis(es)? Yes Addressing deficits in the following areas: balance, endurance, locomotion, strength, transferring, bowel/bladder control, bathing, dressing, feeding, grooming, toileting, cognition, and psychosocial support Can the patient actively participate in an intensive therapy program of at least 3 hrs of therapy 5 days Russo week? Yes The potential for patient to make measurable gains while on inpatient rehab is excellent Anticipated functional outcomes upon discharge from inpatient rehab: modified independent PT, modified independent OT, modified independent SLP Estimated rehab length of stay to reach the above functional goals is: 10-12 d Anticipated discharge destination: Home 10. Overall Rehab/Functional Prognosis: excellent   MD Signature: Meredith Staggers, MD, [facility] 04/20/2021

## 2021-04-20 NOTE — Progress Notes (Addendum)
Progress Note   Subjective  Chief Complaint: GI bleed  Today, the patient is found falling asleep in the chair by her bedside.  Nurse and physical therapy are there as well and stated they have hardly been able to keep her awake.  She does answer questions and is requesting Q-tips for her ears.  Apparently ate fairly well for breakfast.  Denies any further hematochezia overnight.   Objective   Vital signs in last 24 hours: Temp:  [97.9 F (36.6 C)-99.1 F (37.3 C)] 98 F (36.7 C) (06/28 0815) Pulse Rate:  [70-87] 85 (06/28 0354) Resp:  [11-24] 17 (06/28 0815) BP: (122-175)/(62-98) 159/68 (06/28 0815) SpO2:  [94 %-100 %] 98 % (06/28 0815) Weight:  [85.1 kg-86.3 kg] 85.1 kg (06/28 0105) Last BM Date: 04/18/21 General:    AA female in NAD Heart:  Regular rate and rhythm; no murmurs Lungs: Respirations even and unlabored, lungs CTA bilaterally Abdomen:  Soft, nontender and nondistended. Normal bowel sounds. Psych:  Sleepy  Intake/Output from previous day: 06/27 0701 - 06/28 0700 In: 540 [P.O.:540] Out: 1500    Lab Results: Recent Labs    04/18/21 1006 04/18/21 1621 04/19/21 1525 04/19/21 2133 04/20/21 0405  WBC 12.9*  --   --   --   --   HGB 9.9*   < > 9.6* 9.4* 9.6*  HCT 29.2*   < > 28.3* 28.3* 28.1*  PLT 233  --   --   --   --    < > = values in this interval not displayed.   BMET Recent Labs    04/18/21 1006 04/19/21 0413  NA 136 140  K 3.9 3.8  CL 98 99  CO2 26 28  GLUCOSE 101* 87  BUN 27* 30*  CREATININE 6.06* 6.39*  CALCIUM 8.7* 8.7*    Studies/Results: CT HEAD WO CONTRAST  Result Date: 04/18/2021 CLINICAL DATA:  Follow-up stroke. EXAM: CT HEAD WITHOUT CONTRAST TECHNIQUE: Contiguous axial images were obtained from the base of the skull through the vertex without intravenous contrast. COMPARISON:  CT scan April 14, 2021.  MRI of the head April 16, 2021. FINDINGS: Brain: No subdural, epidural, or subarachnoid hemorrhage. No mass effect or midline  shift. Ventricles and sulci are unchanged. The known right basal ganglia infarct is more pronounced on CT imaging in the interval seen on series 4, image 17 with decreased attenuation. No associated hemorrhage. White matter changes otherwise stable. Cerebellum, brainstem, and basal cisterns are normal. No other evidence of acute infarct or ischemia. Vascular: Calcified atherosclerosis in the intracranial carotids. Skull: Normal. Negative for fracture or focal lesion. Sinuses/Orbits: No acute finding. Other: No other abnormalities. IMPRESSION: 1. The right basal ganglia infarct is better visualized on today's CT compared to the April 14, 2021 CT due to group decreasing attenuation. No associated hemorrhage identified. No other significant interval changes. Electronically Signed   By: Dorise Bullion III M.D   On: 04/18/2021 13:04       Assessment / Plan:   Assessment: 1.  Acute GI bleed: In setting of Plavix load and aspirin after recent CVA, multiple episodes of bloody stool in 04/17/2021, EGD and flex sig with no etiology identified, bleeding scan negative, no further hematochezia in the past 48 hours, hemoglobin stable 2.  End-stage renal disease on hemodialysis 3.  Recent acute right subcortical CVA 6/24: On Plavix and aspirin  Plan: 1.  Continue twice daily PPI 2.  Continue to monitor hemoglobin with transfusion as needed 3.  Increased patient to a soft diet today and then can advance as tolerated as long as she is no further bleeding. 4.  We will sign off today. Ok per Dr. Silverio Decamp to restart hgb in 1-2 days.  Thank you for your kind consultation.   LOS: 4 days   Levin Erp  04/20/2021, 9:56 AM   Attending physician's note   I have taken an interval history, reviewed the chart and examined the patient. I agree with the Advanced Practitioner's note, impression and recommendations.    Hemoglobin has remained stable, no evidence of further bleeding Advance diet as tolerated We  will hold off further endoscopic evaluation or colonoscopy at this point Okay to discharge patient from hospital from GI standpoint.  GI will sign off, please call with any questions.    The patient was provided an opportunity to ask questions and all were answered. The patient agreed with the plan and demonstrated an understanding of the instructions.  Damaris Hippo , MD [phone]

## 2021-04-20 NOTE — Progress Notes (Signed)
Inpatient Rehabilitation Medication Review by a Pharmacist  A complete drug regimen review was completed for this patient to identify any potential clinically significant medication issues.  Clinically significant medication issues were identified:  no  Check AMION for pharmacist assigned to patient if future medication questions/issues arise during this admission.  Pharmacist comments:   Time spent performing this drug regimen review (minutes):  15   Haden Suder 04/20/2021 5:14 PM

## 2021-04-20 NOTE — Progress Notes (Signed)
PMR Admission Coordinator Pre-Admission Assessment   Patient: Carolyn Russo is an 70 y.o., female MRN: [id number] DOB: January 25, 1951 Height: _0  (1.575 m) Weight: 85.1 kg   Insurance Information HMO:     PPO:      PCP:      IPA:      80/20: yes    OTHER: PRIMARY: Medicare A/B             Policy#: [id number]      Subscriber: Pt Phone#: Verified online    Fax#: Pre-Cert#:       Employer: Benefits:  Phone #:      Name: Eff. Date: Parts A ad B effective 10/24/2013  Deduct: $6761      Out of Pocket Max:  None      Life Max: N/A  CIR: 100%      SNF: 100 days Outpatient: 80%     Co-Pay: 20% Home Health: 100%      Co-Pay: none DME: 80%     Co-Pay: 20% Providers: patient's choice SECONDARY: [place]      Policy#: [id number]      Phone#:   Financial Counselor:        Phone#:   The "Data Collection Information Summary" for patients in Inpatient Rehabilitation Facilities with attached "Privacy Act [place] Records" was provided and verbally reviewed with: Patient   Emergency Contact Information Contact Information       Name Relation Home Work Mobile    Carolyn Russo. Spouse [phone]   [phone]    Carolyn Russo Son [phone]               Current Medical History  Patient Admitting Diagnosis: CVA History of Present Illness: Carolyn Russo is a 70 year old right-handed female with history significant for hypertension, obesity with BMI 34.31, hyperlipidemia, newly diagnosed end-stage renal disease on hemodialysis Monday Wednesday Friday, diabetes mellitus, CAD maintained on aspirin, iron deficiency anemia and asthma.  Patient with latest admission 03/09/2021 to 03/24/2021 for acute on chronic diastolic congestive heart failure exacerbation.  Per chart review patient lives with spouse.  Two-level home bed and bath main level 4 steps to entry.  Independent with cane prior to admission.  Presented 04/16/2021 with left-sided weakness x3 days and  slurred speech with bouts of vomiting.  It was noted patient had initially presented to the ER couple days prior to the latest admission with complaints of left-sided weakness but left without being formally evaluated.  Cranial CT scan showed no acute abnormalities.  Patient did not receive tPA.  MRI of the brain showed a 2.1 cm acute ischemic nonhemorrhagic right basal ganglia infarction.  MRA angiogram of head and neck grossly negative no large vessel occlusion or high-grade stenosis.  Most recent echocardiogram with ejection fraction of 32% grade 2 diastolic dysfunction.  Admission chemistries hemoglobin 8.8-9.0, chemistries unremarkable sent creatinine 3.58, hemoglobin A1c 5.2.  Hospital course gastroenterology Dr. Carlean Purl consulted 04/18/2021 after patient had multiple large bloody bowel movements with clots.  Underwent upper endoscopy 04/18/2021 showing normal esophagus and stomach and the cardia and gastric fundus normal on retroflexion and also underwent flexible sigmoidoscopy showing hemorrhoids found on perianal exam external/internal hemorrhoids.  Patient had initially been placed on aspirin and Plavix for CVA prophylaxis held due to suspected lower GI bleed awaiting plan on resuming anticoagulation.  Follow-up renal services with hemodialysis ongoing as directed.  Currently on a clear liquid diet advanced as tolerated.  Due to patient decreased functional ability  left side weakness was admitted for a comprehensive rehab program.   Complete NIHSS TOTAL: 3   Patient's medical record from [facility] has been reviewed by the rehabilitation admission coordinator and physician.   Past Medical History      Past Medical History:  Diagnosis Date   Allergy     Anterior chest wall pain     Appendicitis 1965   Asthma     Body mass index 37.0-37.9, adult     Breast pain     Cataract      both eyes   Chronic renal disease, stage II     Dehydration 2014   Deviated septum 1971    Diabetes mellitus     Dyspnea 2014   Extrinsic asthma      WITH ASTHMA ATTACK   Fibroid 1980   GERD (gastroesophageal reflux disease)     Heart murmur     Hx gestational diabetes     Hyperlipidemia     Hypertension 2014   Inguinal hernia 1959   Malaise and fatigue 2014   Nephropathy     Non-IgE mediated allergic asthma 2014   Obesity     Pelvic pain     Pregnancy, high-risk 1985   Tonsillitis 1968   Uterine fibroid 1980      Family History   family history includes Alzheimer's disease in her father; Cancer in her cousin and mother; Diabetes in her cousin and maternal aunt; Psoriasis in her mother.   Prior Rehab/Hospitalizations Has the patient had prior rehab or hospitalizations prior to admission? No   Has the patient had major surgery during 100 days prior to admission? Yes            Current Medications   Current Facility-Administered Medications:   acetaminophen (TYLENOL) tablet 650 mg, 650 mg, Oral, Q4H PRN, 650 mg at 04/16/21 2001 **OR** acetaminophen (TYLENOL) 160 MG/5ML solution 650 mg, 650 mg, Per Tube, Q4H PRN **OR** acetaminophen (TYLENOL) suppository 650 mg, 650 mg, Rectal, Q4H PRN, Tamala Julian, Rondell A, MD   albuterol (PROVENTIL) (2.5 MG/3ML) 0.083% nebulizer solution 2.5 mg, 2.5 mg, Nebulization, Q4H PRN, Tamala Julian, Rondell A, MD, 2.5 mg at 04/18/21 4163   ALPRAZolam Duanne Moron) tablet 0.5 mg, 0.5 mg, Oral, TID PRN, Arrien, Jimmy Picket, MD, 0.5 mg at 04/17/21 1831   arformoterol (BROVANA) nebulizer solution 15 mcg, 15 mcg, Nebulization, BID, Smith, Rondell A, MD, 15 mcg at 04/20/21 0932   budesonide (PULMICORT) nebulizer solution 0.5 mg, 0.5 mg, Nebulization, BID, Smith, Rondell A, MD, 0.5 mg at 04/20/21 0933   carvedilol (COREG) tablet 3.125 mg, 3.125 mg, Oral, BID WC, Smith, Rondell A, MD, 3.125 mg at 04/20/21 8453   Chlorhexidine Gluconate Cloth 2 % PADS 6 each, 6 each, Topical, Daily, Tamala Julian, Rondell A, MD, 6 each at 04/20/21 0958   docusate sodium (COLACE) capsule 100  mg, 100 mg, Oral, Daily PRN, Tamala Julian, Rondell A, MD   fluticasone (FLONASE) 50 MCG/ACT nasal spray 2 spray, 2 spray, Each Nare, Daily PRN, Smith, Rondell A, MD   furosemide (LASIX) tablet 40-80 mg, 40-80 mg, Oral, Q M,W,F, Smith, Rondell A, MD, 40 mg at 04/19/21 0948   hydrALAZINE (APRESOLINE) injection 5 mg, 5 mg, Intravenous, Q4H PRN, Elgergawy, Silver Huguenin, MD, 5 mg at 04/18/21 2338   hydrocortisone (ANUSOL-HC) 2.5 % rectal cream 1 application, 1 application, Rectal, BID PRN, Fuller Plan A, MD, 1 application at 64/68/03 0949   hydrocortisone (ANUSOL-HC) suppository 25 mg, 25 mg, Rectal, BID, Smith, Rondell  A, MD, 25 mg at 04/20/21 0957   ondansetron (ZOFRAN) 4 MG/2ML injection, , , ,   ondansetron (ZOFRAN) 8 mg in sodium chloride 0.9 % 50 mL IVPB, 8 mg, Intravenous, Q6H PRN, Metzger-Cihelka, Desiree, NP   pantoprazole (PROTONIX) injection 40 mg, 40 mg, Intravenous, Q12H, Esterwood, Amy S, PA-C, 40 mg at 04/20/21 1829   rosuvastatin (CRESTOR) tablet 20 mg, 20 mg, Oral, Daily, Smith, Rondell A, MD, 20 mg at 04/20/21 9371   senna-docusate (Senokot-S) tablet 1 tablet, 1 tablet, Oral, QHS PRN, Tamala Julian, Rondell A, MD   sevelamer carbonate (RENVELA) tablet 800 mg, 800 mg, Oral, TID WC, Smith, Rondell A, MD, 800 mg at 04/20/21 0958   traMADol (ULTRAM) tablet 50 mg, 50 mg, Oral, Q12H PRN, Fuller Plan A, MD, 50 mg at 04/20/21 6967   Patients Current Diet:  Diet Order                  DIET SOFT Room service appropriate? Yes; Fluid consistency: Thin  Diet effective now                         Precautions / Restrictions Precautions Precautions: Fall Restrictions Weight Bearing Restrictions: No    Has the patient had 2 or more falls or a fall with injury in the past year? No   Prior Activity Level Limited Community (1-2x/wk): Pt. was active in the community prior to admission   Prior Functional Level Self Care: Did the patient need help bathing, dressing, using the toilet or eating?  Independent   Indoor Mobility: Did the patient need assistance with walking from room to room (with or without device)? Independent   Stairs: Did the patient need assistance with internal or external stairs (with or without device)? Independent   Functional Cognition: Did the patient need help planning regular tasks such as shopping or remembering to take medications? Independent   Home Assistive Devices / Equipment Home Equipment: Cane - single point, Bedside commode, Walker - 4 wheels (has BSC over toilet to incr height; rollator is pt's husband that he does not use (uses cane))   Prior Device Use: Indicate devices/aids used by the patient prior to current illness, exacerbation or injury? None of the above   Current Functional Level Cognition   Overall Cognitive Status: Difficult to assess Difficult to assess due to: Level of arousal Orientation Level: Oriented X4 General Comments: A&Ox4; followed multi-step commands; decreased awareness of need for safety. Attention: Focused, Sustained Focused Attention: Appears intact Sustained Attention: Appears intact Memory: Impaired Memory Impairment: Decreased short term memory Decreased Short Term Memory: Verbal basic Problem Solving: Appears intact Executive Function: Reasoning Reasoning: Appears intact    Extremity Assessment (includes Sensation/Coordination)   Upper Extremity Assessment: LUE deficits/detail LUE Deficits / Details: R hemi; shoulder flex/abd ~50 degrees; painful elbow flexion 2/2 IV site; 3/5 deltoid, 3+/5 bicep, 3/5 tricep, 4/5 gross grasp LUE Sensation: decreased light touch LUE Coordination: decreased fine motor, decreased gross motor  Lower Extremity Assessment: Defer to PT evaluation     ADLs   Overall ADL's : Needs assistance/impaired Lower Body Dressing: Maximal assistance, Sit to/from stand Lower Body Dressing Details (indicate cue type and reason): Max A to don footwear seated EOB. Patient states husband  assists PRN with donning footwear. Toilet Transfer: Minimal Print production planner Details (indicate cue type and reason): Simulated with transfer to recliner with increased time/effort and Min A. Postural instability. Functional mobility during ADLs: Minimal assistance General ADL Comments:  Stand: Min assist Stand pivot transfers: Min assist General transfer comment: vc for hand placement with use of RW; steadying assist due to postural instability    Ambulation / Gait / Stairs / Wheelchair Mobility  Ambulation/Gait Ambulation/Gait assistance: Min assist, +2 physical assistance Gait Distance (Feet): 15 Feet (seated rest x 4 minutes; 15 ft) Assistive device: Rolling walker (2 wheeled) Gait Pattern/deviations: Step-through pattern, Decreased stride length, Decreased weight shift to right, Decreased dorsiflexion - left General Gait Details: unable due to lethargy; stood x 3 minutes for changing undergarment and then too fatigued/lethargic to walk    Posture / Balance Balance Overall balance assessment: Needs assistance Sitting-balance support: Feet supported Sitting balance-Leahy Scale: Fair Standing balance support: Bilateral upper extremity supported, During functional activity Standing balance-Leahy Scale: Poor Standing balance comment: needs UE support    Special needs/care consideration Dialysis: Hemodialysis Monday, Wednesday, and Friday    Previous Home Environment (from acute therapy documentation) Living Arrangements: Spouse/significant other  Lives With:  Spouse Available Help at Discharge: Family Type of Home: House Home Layout: Two level, Able to live on main level with bedroom/bathroom Home Access: Stairs to enter Entrance Stairs-Rails: Right Entrance Stairs-Number of Steps: 4 Bathroom Shower/Tub: Walk-in shower Bathroom Toilet: Standard Bathroom Accessibility: Yes How Accessible: Accessible via wheelchair, Accessible via walker Home Care Services: No  Discharge Living Setting Plans for Discharge Living Setting: Patient's home Type of Home at Discharge: House Discharge Home Layout: Able to live on main level with bedroom/bathroom, Two level Alternate Level Stairs-Rails: Right Alternate Level Stairs-Number of Steps: full flight Discharge Home Access: Stairs to enter Entrance Stairs-Rails: Right Entrance Stairs-Number of Steps: 4 Discharge Bathroom Shower/Tub: Walk-in shower Discharge Bathroom Toilet: Standard Discharge Bathroom Accessibility: Yes How Accessible: Accessible via walker Does the patient have any problems obtaining your medications?: Yes (Describe)  Social/Family/Support Systems Patient Roles: Spouse Contact Information: [phone] Anticipated Caregiver: Dr. Roy Jaimes Anticipated Caregiver's Contact Information: [phone] Ability/Limitations of Caregiver: Can provide Min A Caregiver Availability: 24/7 (Will rotate family, hire assistance if needed) Discharge Plan Discussed with Primary Caregiver: Yes Is Caregiver In Agreement with Plan?: Yes Does Caregiver/Family have Issues with Lodging/Transportation while Pt is in Rehab?: No  Goals Patient/Family Goal for Rehab: PT/OT/SLP Mod I Expected length of stay: 10-12 days Pt/Family Agrees to Admission and willing to participate: Yes Program Orientation Provided & Reviewed with Pt/Caregiver Including Roles  & Responsibilities: Yes  Decrease burden of Care through IP rehab admission: Specialzed equipment needs, Diet advancement, Decrease number of  caregivers, and Patient/family education  Possible need for SNF placement upon discharge: Not anticipated   Patient Condition: I have reviewed medical records from [facility], spoken with CM, and patient and spouse. I met with patient at the bedside for inpatient rehabilitation assessment.  Patient will benefit from ongoing PT, OT, and SLP, can actively participate in 3 hours of therapy a day 5 days of the week, and can make measurable gains during the admission.  Patient will also benefit from the coordinated team approach during an Inpatient Acute Rehabilitation admission.  The patient will receive intensive therapy as well as Rehabilitation physician, nursing, social worker, and care management interventions.  Due to bladder management, bowel management, safety, disease management, medication administration, pain management, and patient education the patient requires 24 hour a day rehabilitation nursing.  The patient is currently min A with mobility and basic ADLs.  Discharge setting and therapy post discharge at home with home health is anticipated.  Patient has agreed to participate in   will also benefit from the coordinated team approach during an Inpatient Acute Rehabilitation admission.  The patient will receive intensive therapy as well as Rehabilitation physician, nursing, social worker, and care management interventions.  Due to bladder management, bowel management, safety, disease management, medication administration, pain management, and patient education the patient requires 24 hour a day rehabilitation nursing.  The patient is currently min A with mobility and basic ADLs.  Discharge setting and therapy post discharge at home with home health is anticipated.  Patient has agreed to participate in the Acute Inpatient Rehabilitation Program and will admit today.   Preadmission Screen Completed By:  Genella Mech, 04/20/2021 10:15 AM ______________________________________________________________________   Discussed status with Dr. Naaman Plummer on 04/20/21  at 1  and received approval for admission today.   Admission Coordinator:  Genella Mech, CCC-SLP, time 1021/Date 04/20/21    Assessment/Plan: Diagnosis: right basal ganglia infarct Does the need for close, 24 hr/day Medical supervision in concert with the patient's rehab needs make it unreasonable for this patient to be served in a less intensive setting? Yes Co-Morbidities requiring supervision/potential complications: HTN, morbid obesity, ESRD on HD, GIB,  CAD Due to bowel management, safety, skin/wound care, disease management, medication administration, pain management, and patient education, does the patient require 24 hr/day rehab nursing? Yes Does the patient require coordinated care of a physician, rehab nurse, PT, OT, and SLP to address physical and functional deficits in the context of the above medical diagnosis(es)? Yes Addressing deficits in the following areas: balance, endurance, locomotion, strength, transferring, bowel/bladder control, bathing, dressing, feeding, grooming, toileting, cognition, and psychosocial support Can the patient actively participate in an intensive therapy program of at least 3 hrs of therapy 5 days a week? Yes The potential for patient to make measurable gains while on inpatient rehab is excellent Anticipated functional outcomes upon discharge from inpatient rehab: modified independent PT, modified independent OT, modified independent SLP Estimated rehab length of stay to reach the above functional goals is: 10-12 d Anticipated discharge destination: Home 10. Overall Rehab/Functional Prognosis: excellent     MD Signature: Meredith Staggers, MD, [facility] 04/20/2021

## 2021-04-21 DIAGNOSIS — Z992 Dependence on renal dialysis: Secondary | ICD-10-CM

## 2021-04-21 DIAGNOSIS — D72825 Bandemia: Secondary | ICD-10-CM

## 2021-04-21 DIAGNOSIS — N186 End stage renal disease: Secondary | ICD-10-CM

## 2021-04-21 DIAGNOSIS — K5901 Slow transit constipation: Secondary | ICD-10-CM

## 2021-04-21 LAB — GLUCOSE, CAPILLARY: Glucose-Capillary: 117 mg/dL — ABNORMAL HIGH (ref 70–99)

## 2021-04-21 MED ORDER — SENNOSIDES-DOCUSATE SODIUM 8.6-50 MG PO TABS
1.0000 | ORAL_TABLET | Freq: Every day | ORAL | Status: DC
  Administered 2021-04-21 – 2021-04-25 (×5): 1 via ORAL
  Filled 2021-04-21 (×6): qty 1

## 2021-04-21 MED ORDER — LIDOCAINE 5 % EX PTCH
1.0000 | MEDICATED_PATCH | CUTANEOUS | Status: DC
  Administered 2021-04-22 – 2021-05-03 (×12): 1 via TRANSDERMAL
  Filled 2021-04-21 (×12): qty 1

## 2021-04-21 MED ORDER — SORBITOL 70 % SOLN
60.0000 mL | Freq: Once | Status: AC
  Administered 2021-04-21: 60 mL via ORAL
  Filled 2021-04-21: qty 60

## 2021-04-21 MED ORDER — CHLORHEXIDINE GLUCONATE CLOTH 2 % EX PADS
6.0000 | MEDICATED_PAD | Freq: Two times a day (BID) | CUTANEOUS | Status: DC
  Administered 2021-04-21 – 2021-05-02 (×20): 6 via TOPICAL

## 2021-04-21 MED ORDER — HEPARIN SODIUM (PORCINE) 1000 UNIT/ML IJ SOLN
INTRAMUSCULAR | Status: AC
  Administered 2021-04-21: 1000 [IU]
  Filled 2021-04-21: qty 4

## 2021-04-21 MED ORDER — LIDOCAINE 5 % EX PTCH
1.0000 | MEDICATED_PATCH | Freq: Once | CUTANEOUS | Status: AC
  Administered 2021-04-21: 1 via TRANSDERMAL
  Filled 2021-04-21: qty 1

## 2021-04-21 NOTE — Progress Notes (Addendum)
PROGRESS NOTE   Subjective/Complaints:  Bursitis of R hip really hurting/bothering her.  Also LBM 3+ days ago- doesn't remember exactly when. But feels constipated.  Getting breathing treatment currently.    ROS:  Pt denies SOB, abd pain, CP, N/V/C/D, and vision changes    Objective:   No results found. Recent Labs    04/20/21 0405 04/20/21 1002  WBC  --  13.7*  HGB 9.6* 9.8*  HCT 28.1* 29.4*  PLT  --  246   Recent Labs    04/19/21 0413  NA 140  K 3.8  CL 99  CO2 28  GLUCOSE 87  BUN 30*  CREATININE 6.39*  CALCIUM 8.7*    Intake/Output Summary (Last 24 hours) at 04/21/2021 1723 Last data filed at 04/21/2021 1623 Gross per 24 hour  Intake --  Output 2000 ml  Net -2000 ml        Physical Exam: Vital Signs Blood pressure (!) 148/51, pulse 66, temperature (!) 97.3 F (36.3 C), temperature source Oral, resp. rate 16, height 5\' 2"  (1.575 m), weight 82 kg, SpO2 100 %.     General: awake, alert, appropriate, sitting up in bed; receiving nebs currently; NAD HENT: conjugate gaze; oropharynx moist CV: regular rate; no JVD Pulmonary: good air movement, no W/R/R GI: soft, NT, ND, (+)BS Psychiatric: appropriate; flat affect- a little sleepy Neurological:alert Musculoskeletal:        General: No swelling.    Cervical back: Normal range of motion.    Comments: Mild tenderness left shoulder with ER/IR today  Skin:    General: Skin is warm and dry. Neurological:    Comments: Patient is alert.  No acute distress.  Makes eye contact with examiner.  Provides name and age.  Follows commands.  Fair insight and awareness. Provided biographical information. Normal language, speech slightly slurred. Mild left central 7. LUE 2 to 2+/5 deltoid, 3/5 biceps, triceps and 3+/5 wrist and HI. LLE 3-4/5 prox to distal. RUE and RLE 4-5/5 prox to distal. No sensory deficits. No abnl tone. Assessment/Plan: 1. Functional  deficits which require 3+ hours per day of interdisciplinary therapy in a comprehensive inpatient rehab setting. Physiatrist is providing close team supervision and 24 hour management of active medical problems listed below. Physiatrist and rehab team continue to assess barriers to discharge/monitor patient progress toward functional and medical goals  Care Tool:  Bathing    Body parts bathed by patient: Right arm, Left arm, Chest, Abdomen, Face   Body parts bathed by helper: Front perineal area, Buttocks, Right upper leg, Left upper leg, Right lower leg, Left lower leg     Bathing assist Assist Level: Maximal Assistance - Patient 24 - 49%     Upper Body Dressing/Undressing Upper body dressing   What is the patient wearing?: Pull over shirt    Upper body assist Assist Level: Moderate Assistance - Patient 50 - 74%    Lower Body Dressing/Undressing Lower body dressing      What is the patient wearing?: Pants     Lower body assist Assist for lower body dressing: Maximal Assistance - Patient 25 - 49%     Toileting Toileting    Toileting  assist Assist for toileting: 2 Helpers     Transfers Chair/bed transfer  Transfers assist     Chair/bed transfer assist level: Moderate Assistance - Patient 50 - 74%     Locomotion Ambulation   Ambulation assist      Assist level: Moderate Assistance - Patient 50 - 74% Assistive device: Walker-rolling Max distance: 10'   Walk 10 feet activity   Assist     Assist level: Moderate Assistance - Patient - 50 - 74% Assistive device: Walker-rolling   Walk 50 feet activity   Assist Walk 50 feet with 2 turns activity did not occur: Safety/medical concerns         Walk 150 feet activity   Assist Walk 150 feet activity did not occur: Safety/medical concerns         Walk 10 feet on uneven surface  activity   Assist Walk 10 feet on uneven surfaces activity did not occur: Safety/medical concerns          Wheelchair     Assist Will patient use wheelchair at discharge?:  (TBD) Type of Wheelchair: Manual    Wheelchair assist level: Dependent - Patient 0% Max wheelchair distance: 150'    Wheelchair 50 feet with 2 turns activity    Assist        Assist Level: Dependent - Patient 0%   Wheelchair 150 feet activity     Assist      Assist Level: Dependent - Patient 0%   Blood pressure (!) 148/51, pulse 66, temperature (!) 97.3 F (36.3 C), temperature source Oral, resp. rate 16, height 5\' 2"  (1.575 m), weight 82 kg, SpO2 100 %.  Medical Problem List and Plan: 1.  Left side weakness secondary to nonhemorrhagic right basal ganglia infarction             -patient may shower             -ELOS/Goals: 10-12 days, mod I goals for PT, OT, SLP  -first day of evaluations of therapies- PT, OT and SLP 2.  Antithrombotics: -DVT/anticoagulation:  SCD             -antiplatelet therapy: PLAN IS TO RESUME ASPIRIN ONLY 81 MG DAILY IN 5-7 DAYS IF NO FURTHER BLEEDING 3. Pain Management: Tramadol 50 mg every 12 hours as needed  6/29- c/o R hip bursitis- will try Lidoderm patch R hip 8am to 8pm- see if that helps- might try injection of steroid? 4. Mood: Xanax 0.5 mg 3 times daily as needed.  Provide emotional support             -antipsychotic agents: N/A 5. Neuropsych: This patient is capable of making decisions on her own behalf. 6. Skin/Wound Care: Routine skin checks 7. Fluids/Electrolytes/Nutrition: Routine in and outs with follow-up chemistries 8.  Acute lower GI bleed with blood loss anemia.  Follow-up GI services.  Resume ASA as above. - Continue Protonix 40 mg twice daily 9.  Chronic diastolic congestive heart failure.  Monitor for any signs of fluid overload.  Continue Lasix 40-80 mg Monday Wednesday Friday             -daily weights 10.  End-stage renal disease.  Hemodialysis as per renal services 11.  Hypertension.  Coreg 3.125 mg twice daily  6/29- BP adequate control-  con't regimen 12.  Asthma.  Continue inhalers as directed 13.Hyperlipidemia.Crestor 14.Obesity.BMI 34.31.Dietery follow up 15.  Diabetes mellitus.hemoglobin AIc 5.2.  Blood sugar checks discontinued  16. Leukocytosis  6/29- WBC up  to 13.7- don't have anything to compare to - will recheck in AM and determine cause if still elevated.  17. Constipation  6/29- LBM 3+ days ago- will order Sorbitol 60cc after therapy- also changed senokot to 1 tab nightly from prn.     LOS: 1 days A FACE TO FACE EVALUATION WAS PERFORMED  Ronel Rodeheaver 04/21/2021, 5:23 PM

## 2021-04-21 NOTE — Discharge Instructions (Signed)
Inpatient Rehab Discharge Instructions  Carolyn Russo Discharge date and time: No discharge date for patient encounter.   Activities/Precautions/ Functional Status: Activity: activity as tolerated Diet: renal diet Wound Care: Routine skin checks Functional status:  ___ No restrictions     ___ Walk up steps independently ___ 24/7 supervision/assistance   ___ Walk up steps with assistance ___ Intermittent supervision/assistance  ___ Bathe/dress independently ___ Walk with walker     _x__ Bathe/dress with assistance ___ Walk Independently    ___ Shower independently ___ Walk with assistance    ___ Shower with assistance ___ No alcohol     ___ Return to work/school ________  Special Instructions: No driving smoking or alcohol  Continue hemodialysis as directedSTROKE/TIA DISCHARGE INSTRUCTIONS SMOKING Cigarette smoking nearly doubles your risk of having a stroke & is the single most alterable risk factor  If you smoke or have smoked in the last 12 months, you are advised to quit smoking for your health. Most of the excess cardiovascular risk related to smoking disappears within a year of stopping. Ask you doctor about anti-smoking medications [place] Quit Line: 1-800-QUIT NOW Free Smoking Cessation Classes [phone]  CHOLESTEROL Know your levels; limit fat & cholesterol in your diet  Lipid Panel     Component Value Date/Time   CHOL 135 04/16/2021 1840   CHOL 158 02/17/2021 1146   TRIG 89 04/16/2021 1840   HDL 47 04/16/2021 1840   HDL 40 02/17/2021 1146   CHOLHDL 2.9 04/16/2021 1840   VLDL 18 04/16/2021 1840   LDLCALC 70 04/16/2021 1840   [place] 99 02/17/2021 1146     Many patients benefit from treatment even if their cholesterol is at goal. Goal: Total Cholesterol (CHOL) less than 160 Goal:  Triglycerides (TRIG) less than 150 Goal:  HDL greater than 40 Goal:  LDL (LDLCALC) less than 100   BLOOD PRESSURE American Stroke Association blood pressure target is less that 120/80  mm/Hg  Your discharge blood pressure is:  BP: (!) 168/73 Monitor your blood pressure Limit your salt and alcohol intake Many individuals will require more than one medication for high blood pressure  DIABETES (A1c is a blood sugar average for last 3 months) Goal HGBA1c is under 7% (HBGA1c is blood sugar average for last 3 months)  Diabetes: No known diagnosis of diabetes    Lab Results  Component Value Date   HGBA1C 5.2 04/16/2021    Your HGBA1c can be lowered with medications, healthy diet, and exercise. Check your blood sugar as directed by your physician Call your physician if you experience unexplained or low blood sugars.  PHYSICAL ACTIVITY/REHABILITATION Goal is 30 minutes at least 4 days per week  Activity: Increase activity slowly, Therapies: Physical Therapy: Home Health Return to work:  Activity decreases your risk of heart attack and stroke and makes your heart stronger.  It helps control your weight and blood pressure; helps you relax and can improve your mood. Participate in a regular exercise program. Talk with your doctor about the best form of exercise for you (dancing, walking, swimming, cycling).  DIET/WEIGHT Goal is to maintain a healthy weight  Your discharge diet is:  Diet Order             DIET SOFT Room service appropriate? Yes; Fluid consistency: Thin  Diet effective now                   liquids Your height is:  Height: 5\' 2"  (157.5 cm) Your current weight  is: Weight: 84.2 kg Your Body Mass Index (BMI) is:  BMI (Calculated): 33.94 Following the type of diet specifically designed for you will help prevent another stroke. Your goal weight range is:   Your goal Body Mass Index (BMI) is 19-24. Healthy food habits can help reduce 3 risk factors for stroke:  High cholesterol, hypertension, and excess weight.  RESOURCES Stroke/Support Group:  Call [phone]   STROKE EDUCATION PROVIDED/REVIEWED AND GIVEN TO PATIENT Stroke warning signs and symptoms How  to activate emergency medical system (call 911). Medications prescribed at discharge. Need for follow-up after discharge. Personal risk factors for stroke. Pneumonia vaccine given: No Flu vaccine given: No My questions have been answered, the writing is legible, and I understand these instructions.  I will adhere to these goals & educational materials that have been provided to me after my discharge from the hospital.       My questions have been answered and I understand these instructions. I will adhere to these goals and the provided educational materials after my discharge from the hospital.  Patient/Caregiver Signature _______________________________ Date __________  Clinician Signature _______________________________________ Date __________  Please bring this form and your medication list with you to all your follow-up doctor's appointments.

## 2021-04-21 NOTE — Progress Notes (Signed)
Patient returned from dialysis. Patient had to use the bathroom. Noted patient has blood in stool. PA notified. Idamae Schuller, LPN

## 2021-04-21 NOTE — Plan of Care (Signed)
  Problem: RH Balance Goal: LTG Patient will maintain dynamic sitting balance (PT) Description: LTG:  Patient will maintain dynamic sitting balance with assistance during mobility activities (PT) Flowsheets (Taken 04/21/2021 1220) LTG: Pt will maintain dynamic sitting balance during mobility activities with:: Supervision/Verbal cueing Goal: LTG Patient will maintain dynamic standing balance (PT) Description: LTG:  Patient will maintain dynamic standing balance with assistance during mobility activities (PT) Flowsheets (Taken 04/21/2021 1220) LTG: Pt will maintain dynamic standing balance during mobility activities with:: Supervision/Verbal cueing   Problem: Sit to Stand Goal: LTG:  Patient will perform sit to stand with assistance level (PT) Description: LTG:  Patient will perform sit to stand with assistance level (PT) Flowsheets (Taken 04/21/2021 1220) LTG: PT will perform sit to stand in preparation for functional mobility with assistance level: Supervision/Verbal cueing   Problem: RH Bed Mobility Goal: LTG Patient will perform bed mobility with assist (PT) Description: LTG: Patient will perform bed mobility with assistance, with/without cues (PT). Flowsheets (Taken 04/21/2021 1220) LTG: Pt will perform bed mobility with assistance level of: Independent with assistive device    Problem: RH Bed to Chair Transfers Goal: LTG Patient will perform bed/chair transfers w/assist (PT) Description: LTG: Patient will perform bed to chair transfers with assistance (PT). Flowsheets (Taken 04/21/2021 1220) LTG: Pt will perform Bed to Chair Transfers with assistance level: Supervision/Verbal cueing   Problem: RH Car Transfers Goal: LTG Patient will perform car transfers with assist (PT) Description: LTG: Patient will perform car transfers with assistance (PT). Flowsheets (Taken 04/21/2021 1220) LTG: Pt will perform car transfers with assist:: Supervision/Verbal cueing   Problem: RH Ambulation Goal:  LTG Patient will ambulate in controlled environment (PT) Description: LTG: Patient will ambulate in a controlled environment, # of feet with assistance (PT). Flowsheets (Taken 04/21/2021 1220) LTG: Pt will ambulate in controlled environ  assist needed:: Supervision/Verbal cueing LTG: Ambulation distance in controlled environment: 150 ft with LRAD Goal: LTG Patient will ambulate in home environment (PT) Description: LTG: Patient will ambulate in home environment, # of feet with assistance (PT). Flowsheets (Taken 04/21/2021 1220) LTG: Pt will ambulate in home environ  assist needed:: Supervision/Verbal cueing LTG: Ambulation distance in home environment: 75 ft with LRAD   Problem: RH Stairs Goal: LTG Patient will ambulate up and down stairs w/assist (PT) Description: LTG: Patient will ambulate up and down # of stairs with assistance (PT) Flowsheets (Taken 04/21/2021 1220) LTG: Pt will ambulate up/down stairs assist needed:: Minimal Assistance - Patient > 75% LTG: Pt will  ambulate up and down number of stairs: 4 stairs with one handrail

## 2021-04-21 NOTE — Progress Notes (Signed)
[facility] Progress Note   Subjective:     Patient now on CIR. Seen and examined in patient's room. Currently working with PT. She appears more awake today. Denies SOB, CP, ABD pain, and N/V/D. Plan for HD today.  Objective Vitals:   04/21/21 0101 04/21/21 0503 04/21/21 0504 04/21/21 0900  BP: (!) 151/64  (!) 168/73 129/69  Pulse: 74  79 79  Resp: 18  18   Temp: 99.6 F (37.6 C)  98.8 F (37.1 C)   TempSrc: Oral  Oral   SpO2: 100%  99%   Weight:  84.2 kg    Height:       Physical Exam General: Ill-appearing female; more awake today; NAD Heart: S1 and S2; No murmurs, gallops, or rubs Lungs: Clear throughout; No wheezing, rales, or rhonchi Abdomen: Soft and non-tender Extremities: No edema BLLE Dialysis Access: [facility]    Filed Weights   04/20/21 1604 04/20/21 1642 04/21/21 0503  Weight: 83.7 kg 83.7 kg 84.2 kg   No intake or output data in the 24 hours ending 04/21/21 1011  Additional Objective Labs: Basic Metabolic Panel: Recent Labs  Lab 04/17/21 0224 04/18/21 1006 04/19/21 0413  NA 136 136 140  K 3.6 3.9 3.8  CL 97* 98 99  CO2 29 26 28   GLUCOSE 90 101* 87  BUN 16 27* 30*  CREATININE 4.08* 6.06* 6.39*  CALCIUM 8.8* 8.7* 8.7*  PHOS 3.7  --   --    Liver Function Tests: Recent Labs  Lab 04/14/21 1824 04/17/21 0224  AST 27  --   ALT 21  --   ALKPHOS 59  --   BILITOT 0.7  --   PROT 6.9  --   ALBUMIN 3.4* 3.1*   No results for input(s): LIPASE, AMYLASE in the last 168 hours. CBC: Recent Labs  Lab 04/14/21 1824 04/14/21 1833 04/16/21 1215 04/17/21 0224 04/17/21 1504 04/18/21 1006 04/18/21 1621 04/19/21 2133 04/20/21 0405 04/20/21 1002  WBC 9.9  --  9.0 9.9  --  12.9*  --   --   --  13.7*  NEUTROABS 5.4  --  4.2 3.6  --  6.2  --   --   --   --   HGB 8.8*   < > 9.0* 8.9*   < > 9.9*   < > 9.4* 9.6* 9.8*  HCT 26.7*   < > 27.3* 26.1*   < > 29.2*   < > 28.3* 28.1* 29.4*  MCV 87.5  --  88.1 85.9  --  87.7  --   --   --  89.1   PLT 265  --  282 267  --  233  --   --   --  246   < > = values in this interval not displayed.   Blood Culture No results found for: SDES, SPECREQUEST, CULT, REPTSTATUS  Cardiac Enzymes: No results for input(s): CKTOTAL, CKMB, CKMBINDEX, TROPONINI in the last 168 hours. CBG: Recent Labs  Lab 04/19/21 2033 04/20/21 0117 04/20/21 0353 04/20/21 0751 04/20/21 1217  GLUCAP 195* 108* 117* 127* 174*   Iron Studies: No results for input(s): IRON, TIBC, TRANSFERRIN, FERRITIN in the last 72 hours. Lab Results  Component Value Date   INR 1.0 04/14/2021   INR 1.0 03/31/2017   Studies/Results: No results found.  Medications:   arformoterol  15 mcg Nebulization BID   budesonide (PULMICORT) nebulizer solution  0.5 mg Nebulization BID   carvedilol  3.125 mg Oral BID  WC   Chlorhexidine Gluconate Cloth  6 each Topical BID   furosemide  40 mg Oral Q M,W,F   hydrocortisone  25 mg Rectal BID   lidocaine  1 patch Transdermal Once   [START ON 04/22/2021] lidocaine  1 patch Transdermal Q24H   pantoprazole  40 mg Oral BID   rosuvastatin  20 mg Oral Daily   sevelamer carbonate  800 mg Oral TID WC   sorbitol  60 mL Oral Once    Dialysis Orders: [facility]-MWF 4 hrs 180NRe 400/Autoflow 1.5 86.5 kg 2.0K/2.0 Ca TDC -heparin 2000 units IV TIW -Mircera 100 mcg IV q 2 weeks (last dose 04/14/2021 -Hectorol 5 mcg IV TIW  Assessment/Plan: Acute subcortical R CVA-per Primary/Neurology Acute LGIB-H/O hemorrhoidal bleeding in past. new onset 06/25 post clopidogrel load. HGB 9.0 on admission, fell to 7.2. S/P 2 units PRBCS on 04/18/21. Hgb now 9.8. Clopidogrel and ASA on hold. No heparin with HD. Per GI: EGD and flex sig with no etiology and bleeding scan (-).  ESRD -  MWF-received HD overnight. DC Heparin-new GIB. Continue 4.0 K bath. K+ 3.8. Plan for HD today.  Hypertension/volume  - BP variable, elevated this morning. She is under EDW. No evidence of volume overload by exam. UF as tolerated-more likely  will lower EDW at discharge.  Anemia  - Hgb now 9.8. As noted in # 2. Follow HGB. Transfuse PRN. Metabolic bone disease -  No on clear liquid diet until cleared by swallow-evaluation. PTH only 110 03/26/21 on 5 mcg Hectorol. Hold VDRA.  Last PO4 at goal. Renvela 1 G PO TIW when able to eat. Nutrition - Now on clear liquid diet- Speech therapy consulted-awaiting swallow evaluation.  DTM2- per primary  Tobie Poet, NP [facility] 04/21/2021,10:11 AM  LOS: 1 day

## 2021-04-21 NOTE — Progress Notes (Signed)
Was reported that pt had blood on bed and in brief when returned from dialysis. Assessed pt, pt on toilet at time of assessment. There was minimal amount of blood on bed, very light color with scant amount of clots, brief had moderate of blood in brief, PA contacted by LPN. Notified next shift of change, requested VS and to continue to monitor pt.

## 2021-04-21 NOTE — Evaluation (Signed)
Occupational Therapy Assessment and Plan  Patient Details  Name: Carolyn Russo MRN: [id number] Date of Birth: May 23, 1951  OT Diagnosis: abnormal posture, cognitive deficits, hemiplegia affecting non-dominant side, muscle weakness (generalized), and pain in joint Rehab Potential: Rehab Potential (ACUTE ONLY): Fair ELOS: 2-2.5 weeks   Today's Date: 04/21/2021 OT Individual Time: 800-900 60 min  Hospital Problem: Principal Problem:   Cerebrovascular accident (CVA) of right basal ganglia ([place])   Past Medical History:  Past Medical History:  Diagnosis Date   Allergy    Anterior chest wall pain    Appendicitis 1965   Asthma    Body mass index 37.0-37.9, adult    Breast pain    Cataract    both eyes   Chronic renal disease, stage II    Dehydration 2014   Deviated septum 1971   Diabetes mellitus    Dyspnea 2014   Extrinsic asthma    WITH ASTHMA ATTACK   Fibroid 1980   GERD (gastroesophageal reflux disease)    Heart murmur    Hx gestational diabetes    Hyperlipidemia    Hypertension 2014   Inguinal hernia 1959   Malaise and fatigue 2014   Nephropathy    Non-IgE mediated allergic asthma 2014   Obesity    Pelvic pain    Pregnancy, high-risk 1985   Tonsillitis 1968   Uterine fibroid 1980   Past Surgical History:  Past Surgical History:  Procedure Laterality Date   [place]   COLONOSCOPY     ESOPHAGOGASTRODUODENOSCOPY (EGD) WITH PROPOFOL N/A 04/18/2021   Procedure: ESOPHAGOGASTRODUODENOSCOPY (EGD) WITH PROPOFOL;  Surgeon: Gatha Mayer, MD;  Location: [facility] ENDOSCOPY;  Service: Endoscopy;  Laterality: N/A;   EYE SURGERY     bilateral cataract    FLEXIBLE SIGMOIDOSCOPY N/A 04/18/2021   Procedure: FLEXIBLE SIGMOIDOSCOPY;  Surgeon: Gatha Mayer, MD;  Location: [facility] ENDOSCOPY;  Service: Endoscopy;  Laterality: N/A;   HERNIA REPAIR  1959   IR FLUORO GUIDE CV LINE RIGHT  03/18/2021   IR US GUIDE VASC ACCESS RIGHT  03/18/2021   LEFT HEART  CATH AND CORONARY ANGIOGRAPHY N/A 04/04/2017   Procedure: Left Heart Cath and Coronary Angiography;  Surgeon: Belva Crome, MD;  Location: [facility];  Service: Cardiovascular;  Laterality: N/A;   MYOMECTOMY  1980, 2004, 2007   RHINOPLASTY  1971   ROTATOR CUFF REPAIR  2003   SURGICAL REPAIR OF HEMORRHAGE  2015   TONSILLECTOMY  1968    Assessment & Plan Clinical Impression: Carolyn Russo is a 70 year old right-handed female with history significant for hypertension, obesity with BMI 34.31, hyperlipidemia, newly diagnosed end-stage renal disease on hemodialysis Monday Wednesday Friday, diabetes mellitus, CAD maintained on aspirin, iron deficiency anemia and asthma.  Patient with latest admission 03/09/2021 to 03/24/2021 for acute on chronic diastolic congestive heart failure exacerbation.  Per chart review patient lives with spouse.  Two-level home bed and bath main level 4 steps to entry.  Independent with cane prior to admission.  Presented 04/16/2021 with left-sided weakness x3 days and slurred speech with bouts of vomiting.  It was noted patient had initially presented to the ER couple days prior to the latest admission with complaints of left-sided weakness but left without being formally evaluated.  Cranial CT scan showed no acute abnormalities.  Patient did not receive tPA.  MRI of the brain showed a 2.1 cm acute ischemic nonhemorrhagic right basal ganglia infarction.  MRA angiogram of head and  neck grossly negative no large vessel occlusion or high-grade stenosis.  Most recent echocardiogram with ejection fraction of 94% grade 2 diastolic dysfunction.  Admission chemistries hemoglobin 8.8-9.0, chemistries unremarkable sent creatinine 3.58, hemoglobin A1c 5.2.  Hospital course gastroenterology Dr. Carlean Purl consulted 04/18/2021 after patient had multiple large bloody bowel movements with clots.  Underwent upper endoscopy 04/18/2021 showing normal esophagus and stomach and the cardia and gastric fundus  normal on retroflexion and also underwent flexible sigmoidoscopy showing hemorrhoids found on perianal exam external/internal hemorrhoids.  Patient had initially been placed on aspirin and Plavix for CVA prophylaxis held due to suspected lower GI bleed awaiting plan on resuming anticoagulation.  Follow-up renal services with hemodialysis ongoing as directed.  Currently on a soft diet advanced as tolerated.  Due to patient decreased functional ability left side weakness was admitted for a comprehensive rehab program.     Patient currently requires max with basic self-care skills secondary to muscle weakness, decreased cardiorespiratoy endurance, impaired timing and sequencing, unbalanced muscle activation, and decreased coordination, decreased attention to left and decreased motor planning, decreased attention, decreased awareness, decreased problem solving, decreased safety awareness, decreased memory, and delayed processing, and decreased sitting balance, decreased standing balance, decreased postural control, hemiplegia, and decreased balance strategies.  Prior to hospitalization, patient could complete ADL with . independent  Patient will benefit from skilled intervention to decrease level of assist with basic self-care skills and increase independence with basic self-care skills prior to discharge home with care partner.  Anticipate patient will require 24 hour supervision and follow up home health.  OT - End of Session Endurance Deficit: Yes Endurance Deficit Description: frequent rest breaks due to fatigue and lethargy this date OT Assessment Rehab Potential (ACUTE ONLY): Fair OT Barriers to Discharge: Inaccessible home environment;Decreased caregiver support OT Patient demonstrates impairments in the following area(s): Safety;Balance;Cognition;Edema;Endurance;Motor;Nutrition;Pain;Perception;Sensory OT Basic ADL's Functional Problem(s): Grooming;Bathing;Dressing;Toileting OT Transfers Functional  Problem(s): Toilet;Tub/Shower OT Plan OT Intensity: Minimum of 1-2 x/day, 45 to 90 minutes OT Frequency: 5 out of 7 days OT Duration/Estimated Length of Stay: 2-2.5 weeks OT Treatment/Interventions: Balance/vestibular training;Discharge planning;Pain management;Self Care/advanced ADL retraining;Therapeutic Activities;UE/LE Coordination activities;Visual/perceptual remediation/compensation;Therapeutic Exercise;Skin care/wound managment;Patient/family education;Functional mobility training;Disease mangement/prevention;Cognitive remediation/compensation;Community reintegration;DME/adaptive equipment instruction;Neuromuscular re-education;Psychosocial support;Splinting/orthotics;UE/LE Strength taining/ROM;Wheelchair propulsion/positioning OT Self Feeding Anticipated Outcome(s): S OT Basic Self-Care Anticipated Outcome(s): S OT Toileting Anticipated Outcome(s): S OT Bathroom Transfers Anticipated Outcome(s): S OT Recommendation Patient destination: Home Follow Up Recommendations: Home health OT Equipment Recommended: To be determined   OT Evaluation Precautions/Restrictions  Precautions Precautions: Fall Restrictions Weight Bearing Restrictions: No General PT Missed Treatment Reason: Patient fatigue Vital Signs Therapy Vitals Temp: (!) 97.4 F (36.3 C) Temp Source: Oral Pulse Rate: 75 Resp: 16 BP: (!) 149/61 Patient Position (if appropriate): Lying Oxygen Therapy SpO2: 99 % O2 Device: Room Air Pain Pain Assessment Pain Scale: 0-10 Pain Score: 4  Pain Type: Acute pain Pain Location: Hip Pain Orientation: Right Pain Descriptors / Indicators: Aching Pain Intervention(s): Medication (See eMAR) Home Living/Prior Functioning Home Living Family/patient expects to be discharged to:: Private residence Living Arrangements: Spouse/significant other Available Help at Discharge: Family, Available PRN/intermittently Type of Home: House Home Access: Stairs to enter Engineer, site of Steps: 4 Entrance Stairs-Rails: Right Home Layout: Two level, Able to live on main level with bedroom/bathroom Additional Comments: husband works, Secretary/administrator to hire help  Lives With: Spouse Prior Function Level of Independence: Independent with gait, Independent with transfers, Requires assistive device for independence  Able to Take Stairs?: Yes Driving: Yes Vocation: Retired Comments:  was using cane PTA (had prolonged recent hospitalization with resulting weakness and was receiving HHPT); cannot shower due to port-a-cath in rt upper chest Vision Baseline Vision/History: No visual deficits Perception  Perception: Impaired Inattention/Neglect: Does not attend to left side of body;Does not attend to left visual field Praxis Praxis: Impaired Praxis Impairment Details: Motor planning Cognition Overall Cognitive Status: Impaired/Different from baseline Arousal/Alertness: Lethargic Orientation Level: Place;Situation;Person Person: Oriented Place: Oriented Situation: Oriented Year: 2022 Month: June Day of Week: Correct Memory: Impaired Memory Impairment: Decreased short term memory Immediate Memory Recall: Blue;Bed;Sock Memory Recall Sock: Not able to recall Memory Recall Blue: Not able to recall Memory Recall Bed: Not able to recall Attention: Focused;Sustained Focused Attention: Impaired Focused Attention Impairment: Verbal basic;Functional basic Sustained Attention: Impaired Sustained Attention Impairment: Functional basic;Verbal basic Awareness: Impaired Awareness Impairment: Anticipatory impairment Problem Solving: Impaired Safety/Judgment: Impaired Sensation Sensation Light Touch: Impaired by gross assessment Proprioception: Impaired by gross assessment Additional Comments: difficult to fully assess due to lethargy Coordination Gross Motor Movements are Fluid and Coordinated: No Fine Motor Movements are Fluid and Coordinated: No Coordination and  Movement Description: impaired 2/2 L hemi Motor  Motor Motor: Hemiplegia;Abnormal tone;Abnormal postural alignment and control Motor - Skilled Clinical Observations: L hemi  Trunk/Postural Assessment  Cervical Assessment Cervical Assessment: Within Functional Limits Thoracic Assessment Thoracic Assessment: Exceptions to [facility] (rounded shoulders) Lumbar Assessment Lumbar Assessment: Exceptions to [facility] (posterior pelvic tilt) Postural Control Postural Control: Deficits on evaluation Trunk Control: impaired, L lateral lean in sitting and standing Righting Reactions: delayed/impaired  Balance Balance Balance Assessed: Yes Static Sitting Balance Static Sitting - Balance Support: No upper extremity supported;Feet supported Static Sitting - Level of Assistance: 4: Min assist;5: Stand by assistance Dynamic Sitting Balance Dynamic Sitting - Balance Support: No upper extremity supported;Feet supported;During functional activity Dynamic Sitting - Level of Assistance: 4: Min Insurance risk surveyor Standing - Balance Support: Bilateral upper extremity supported;During functional activity Static Standing - Level of Assistance: 4: Min assist Dynamic Standing Balance Dynamic Standing - Balance Support: Bilateral upper extremity supported;During functional activity Dynamic Standing - Level of Assistance: 3: Mod assist Extremity/Trunk Assessment RUE Assessment RUE Assessment: Within Functional Limits LUE Assessment LUE Assessment: Exceptions to [facility] LUE Body System: Neuro Brunstrum levels for arm and hand: Arm;Hand Brunstrum level for arm: Stage IV Movement is deviating from synergy Brunstrum level for hand: Stage V Independence from basic synergies  Care Tool Care Tool Self Care Eating        Oral Care    Oral Care Assist Level: Minimal Assistance - Patient > 75%    Bathing   Body parts bathed by patient: Right arm;Left arm;Chest;Abdomen;Face Body parts bathed by helper:  Front perineal area;Buttocks;Right upper leg;Left upper leg;Right lower leg;Left lower leg   Assist Level: Maximal Assistance - Patient 24 - 49%    Upper Body Dressing(including orthotics)   What is the patient wearing?: Pull over shirt   Assist Level: Moderate Assistance - Patient 50 - 74%    Lower Body Dressing (excluding footwear)   What is the patient wearing?: Pants Assist for lower body dressing: Maximal Assistance - Patient 25 - 49%    Putting on/Taking off footwear   What is the patient wearing?: Socks;Shoes Assist for footwear: Moderate Assistance - Patient 50 - 74%       Care Tool Toileting Toileting activity         Care Tool Bed Mobility Roll left and right activity   Roll left and right assist level:  Minimal Assistance - Patient > 75%    Sit to lying activity   Sit to lying assist level: Moderate Assistance - Patient 50 - 74%    Lying to sitting edge of bed activity   Lying to sitting edge of bed assist level: Minimal Assistance - Patient > 75%     Care Tool Transfers Sit to stand transfer   Sit to stand assist level: Moderate Assistance - Patient 50 - 74%    Chair/bed transfer   Chair/bed transfer assist level: Moderate Assistance - Patient 50 - 74%     Toilet transfer   Assist Level: Moderate Assistance - Patient 50 - 74%     Care Tool Cognition Expression of Ideas and Wants Expression of Ideas and Wants: Some difficulty - exhibits some difficulty with expressing needs and ideas (e.g, some words or finishing thoughts) or speech is not clear   Understanding Verbal and Non-Verbal Content Understanding Verbal and Non-Verbal Content: Usually understands - understands most conversations, but misses some part/intent of message. Requires cues at times to understand   Memory/Recall Ability *first 3 days only Memory/Recall Ability *first 3 days only: Current season;Location of own room;Staff names and faces;That he or she is in a hospital/hospital unit     Refer to Care Plan for [place] 1 OT Short Term Goal 1 (Week 1): Pt transfers to toilet wiht MIN A OT Short Term Goal 2 (Week 1): Pt will stand to groom at sink wiht MIN A OT Short Term Goal 3 (Week 1): Pt will don shirt wiht MIN A OT Short Term Goal 4 (Week 1): Pt will recall hemi dressing strategies wiht min VC  Recommendations for other services: None    Skilled Therapeutic Intervention 1:1. Pt received in bed agreeable to OT. Pt requires significant time to arouse after gathing ADL and w/c needs. Pt completes sup>sit with eyes closed and MOD A. Pt completes fucnitonal mobility at MOD A d/t lethargy improving to MIN A during session. Pt requires A listed below during ADLs. Pt demo mild L hemi and L inattention impacting performance of ADLs/transfers. Exited session with pt seated in bed with LPN in room, exit alarm on and call light in reach   ADL ADL Grooming: Minimal assistance Where Assessed-Grooming: Sitting at sink Upper Body Bathing: Minimal assistance Where Assessed-Upper Body Bathing: Chair Lower Body Bathing: Maximal assistance Where Assessed-Lower Body Bathing: Sitting at sink Upper Body Dressing: Moderate assistance Where Assessed-Upper Body Dressing: Sitting at sink Lower Body Dressing: Maximal assistance Where Assessed-Lower Body Dressing: Sitting at sink Toilet Transfer: Moderate assistance Toilet Transfer Method: Stand pivot Mobility  Bed Mobility Bed Mobility: Rolling Right;Rolling Left;Supine to Sit;Sit to Supine Rolling Right: Minimal Assistance - Patient > 75% Rolling Left: Minimal Assistance - Patient > 75% Supine to Sit: Minimal Assistance - Patient > 75% Sit to Supine: Moderate Assistance - Patient 50-74%   Discharge Criteria: Patient will be discharged from OT if patient refuses treatment 3 consecutive times without medical reason, if treatment goals not met, if there is a change in medical status, if patient makes no  progress towards goals or if patient is discharged from hospital.  The above assessment, treatment plan, treatment alternatives and goals were discussed and mutually agreed upon: by patient  Tonny Branch 04/21/2021, 12:33 PM

## 2021-04-21 NOTE — Progress Notes (Signed)
Inpatient Rehabilitation  Patient information reviewed and entered into eRehab system by Cherly Erno Lanisha Stepanian, OTR/L.   Information including medical coding, functional ability and quality indicators will be reviewed and updated through discharge.    

## 2021-04-21 NOTE — Progress Notes (Signed)
Patient ID: Carolyn Russo, female   DOB: 04-21-1951, 70 y.o.   MRN: [id number]  SW met with pt and pt wife in room to introduce self, explain role, and discuss discharge process. Pt was sleeping and not easily awakened during visit with husband. SW discussed with to husband Dr [name], plan of care for pt. Reports that he is working on hiring her an aide to be with her during the day when he is not able to there since he works Insurance account manager). States current agency he was interested in did not having any aide staff available. States there is not much family to assist. He has some family that can check in periodically, and pt has a few friends that can assist PRN as well. He states he will be primary contact. SW will make efforts to meet with pt again to complete assessment. SW provided pt husband with sitter list.   Loralee Pacas, MSW, [place] Office: [phone] Cell: [phone] Fax: [phone]

## 2021-04-21 NOTE — Evaluation (Signed)
Physical Therapy Assessment and Plan  Patient Details  Name: MAALIYAH ADOLPH MRN: [id number] Date of Birth: Feb 16, 1951  PT Diagnosis: Abnormal posture, Abnormality of gait, Cognitive deficits, Difficulty walking, Hemiplegia non-dominant, Impaired cognition, Impaired sensation, Muscle spasms, and Muscle weakness Rehab Potential: Good ELOS: 12-14 days   Today's Date: 04/21/2021 PT Individual Time: 0900-0953 PT Individual Time Calculation (min): 108 min    Hospital Problem: Principal Problem:   Cerebrovascular accident (CVA) of right basal ganglia ([place])   Past Medical History:  Past Medical History:  Diagnosis Date   Allergy    Anterior chest wall pain    Appendicitis 1965   Asthma    Body mass index 37.0-37.9, adult    Breast pain    Cataract    both eyes   Chronic renal disease, stage II    Dehydration 2014   Deviated septum 1971   Diabetes mellitus    Dyspnea 2014   Extrinsic asthma    WITH ASTHMA ATTACK   Fibroid 1980   GERD (gastroesophageal reflux disease)    Heart murmur    Hx gestational diabetes    Hyperlipidemia    Hypertension 2014   Inguinal hernia 1959   Malaise and fatigue 2014   Nephropathy    Non-IgE mediated allergic asthma 2014   Obesity    Pelvic pain    Pregnancy, high-risk 1985   Tonsillitis 1968   Uterine fibroid 1980   Past Surgical History:  Past Surgical History:  Procedure Laterality Date   [place]   COLONOSCOPY     ESOPHAGOGASTRODUODENOSCOPY (EGD) WITH PROPOFOL N/A 04/18/2021   Procedure: ESOPHAGOGASTRODUODENOSCOPY (EGD) WITH PROPOFOL;  Surgeon: Gatha Mayer, MD;  Location: [facility] ENDOSCOPY;  Service: Endoscopy;  Laterality: N/A;   EYE SURGERY     bilateral cataract    FLEXIBLE SIGMOIDOSCOPY N/A 04/18/2021   Procedure: FLEXIBLE SIGMOIDOSCOPY;  Surgeon: Gatha Mayer, MD;  Location: [facility] ENDOSCOPY;  Service: Endoscopy;  Laterality: N/A;   HERNIA REPAIR  1959   IR FLUORO GUIDE CV LINE RIGHT  03/18/2021    IR US GUIDE VASC ACCESS RIGHT  03/18/2021   LEFT HEART CATH AND CORONARY ANGIOGRAPHY N/A 04/04/2017   Procedure: Left Heart Cath and Coronary Angiography;  Surgeon: Belva Crome, MD;  Location: [place] CV LAB;  Service: Cardiovascular;  Laterality: N/A;   MYOMECTOMY  1980, 2004, 2007   RHINOPLASTY  1971   ROTATOR CUFF REPAIR  2003   SURGICAL REPAIR OF HEMORRHAGE  2015   TONSILLECTOMY  1968    Assessment & Plan Clinical Impression:  Mirca ROREY HODGES is a 70 year old right-handed female with history significant for hypertension, obesity with BMI 34.31, hyperlipidemia, newly diagnosed end-stage renal disease on hemodialysis Monday Wednesday Friday, diabetes mellitus, CAD maintained on aspirin, iron deficiency anemia and asthma.  Patient with latest admission 03/09/2021 to 03/24/2021 for acute on chronic diastolic congestive heart failure exacerbation.  Per chart review patient lives with spouse.  Two-level home bed and bath main level 4 steps to entry.  Independent with cane prior to admission.  Presented 04/16/2021 with left-sided weakness x3 days and slurred speech with bouts of vomiting.  It was noted patient had initially presented to the ER couple days prior to the latest admission with complaints of left-sided weakness but left without being formally evaluated.  Cranial CT scan showed no acute abnormalities.  Patient did not receive tPA.  MRI of the brain showed a 2.1 cm acute ischemic nonhemorrhagic  right basal ganglia infarction.  MRA angiogram of head and neck grossly negative no large vessel occlusion or high-grade stenosis.  Most recent echocardiogram with ejection fraction of 07% grade 2 diastolic dysfunction.  Admission chemistries hemoglobin 8.8-9.0, chemistries unremarkable sent creatinine 3.58, hemoglobin A1c 5.2.  Hospital course gastroenterology Dr. Carlean Purl consulted 04/18/2021 after patient had multiple large bloody bowel movements with clots.  Underwent upper endoscopy 04/18/2021 showing  normal esophagus and stomach and the cardia and gastric fundus normal on retroflexion and also underwent flexible sigmoidoscopy showing hemorrhoids found on perianal exam external/internal hemorrhoids.  Patient had initially been placed on aspirin and Plavix for CVA prophylaxis held due to suspected lower GI bleed awaiting plan on resuming anticoagulation.  Follow-up renal services with hemodialysis ongoing as directed.  Currently on a soft diet advanced as tolerated.  Due to patient decreased functional ability left side weakness was admitted for a comprehensive rehab program. Patient transferred to CIR on 04/20/2021 .   Patient currently requires mod with mobility secondary to muscle weakness, decreased cardiorespiratoy endurance, abnormal tone and unbalanced muscle activation, decreased awareness, decreased problem solving, decreased safety awareness, and decreased memory, and decreased sitting balance, decreased standing balance, decreased postural control, hemiplegia, and decreased balance strategies.  Prior to hospitalization, patient was modified independent  with mobility and lived with Spouse in a House home.  Home access is 4Stairs to enter.  Patient will benefit from skilled PT intervention to maximize safe functional mobility, minimize fall risk, and decrease caregiver burden for planned discharge home with intermittent assist.  Anticipate patient will benefit from follow up [facility] at discharge.  PT - End of Session Activity Tolerance: Tolerates 30+ min activity with multiple rests Endurance Deficit: Yes Endurance Deficit Description: frequent rest breaks due to fatigue and lethargy this date PT Assessment Rehab Potential (ACUTE/IP ONLY): Good PT Barriers to Discharge: Decreased caregiver support;Home environment access/layout;Incontinence;Lack of/limited family support;Hemodialysis PT Patient demonstrates impairments in the following area(s): Balance;Endurance;Motor;Safety;Sensory PT Transfers  Functional Problem(s): Bed Mobility;Bed to Chair;Car;Furniture;Floor PT Locomotion Functional Problem(s): Ambulation;Wheelchair Mobility;Stairs PT Plan PT Intensity: Minimum of 1-2 x/day ,45 to 90 minutes PT Frequency: 5 out of 7 days PT Duration Estimated Length of Stay: 12-14 days PT Treatment/Interventions: Ambulation/gait training;Balance/vestibular training;Cognitive remediation/compensation;Community reintegration;Discharge planning;Disease management/prevention;DME/adaptive equipment instruction;Functional electrical stimulation;Functional mobility training;Neuromuscular re-education;Patient/family education;Psychosocial support;Splinting/orthotics;Stair training;Therapeutic Activities;Therapeutic Exercise;UE/LE Strength taining/ROM;UE/LE Coordination activities;Wheelchair propulsion/positioning PT Transfers Anticipated Outcome(s): Supervision PT Locomotion Anticipated Outcome(s): Supervision with LRAD PT Recommendation Follow Up Recommendations: Home health PT;24 hour supervision/assistance Patient destination: Home Equipment Recommended: Rolling walker with 5" wheels;To be determined Equipment Details: TBD pending progress   PT Evaluation Precautions/Restrictions Precautions Precautions: Fall Restrictions Weight Bearing Restrictions: No General PT Amount of Missed Time (min): 7 Minutes PT Missed Treatment Reason: Patient fatigue  Pain Pain Assessment Pain Scale: 0-10 Pain Score: 4  Pain Type: Acute pain Pain Location: Hip Pain Orientation: Right Pain Descriptors / Indicators: Aching Pain Intervention(s): Medication (See eMAR) Home Living/Prior Functioning Home Living Available Help at Discharge: Family;Available PRN/intermittently Type of Home: House Home Access: Stairs to enter [employer] of Steps: 4 Entrance Stairs-Rails: Right Home Layout: Two level;Able to live on main level with bedroom/bathroom Additional Comments: husband works, plan to hire  help  Lives With: Spouse Prior Function Level of Independence: Independent with gait;Independent with transfers;Requires assistive device for independence  Able to Take Stairs?: Yes Driving: Yes Vocation: Retired Comments: was using cane PTA (had prolonged recent hospitalization with resulting weakness and was receiving HHPT); cannot shower due to port-a-cath in rt upper  chest Vision/Perception  Vision - History Baseline Vision: Wears glasses only for reading Perception Perception: Within Functional Limits Praxis Praxis: Intact  Cognition Overall Cognitive Status: Impaired/Different from baseline Arousal/Alertness: Lethargic Orientation Level: Oriented X4 Attention: Focused;Sustained Focused Attention: Impaired Focused Attention Impairment: Verbal basic;Functional basic Sustained Attention: Impaired Sustained Attention Impairment: Functional basic;Verbal basic Memory: Impaired Memory Impairment: Decreased short term memory Awareness: Impaired Awareness Impairment: Anticipatory impairment Problem Solving: Impaired Safety/Judgment: Impaired Sensation Sensation Light Touch: Impaired by gross assessment Proprioception: Impaired by gross assessment Additional Comments: difficult to fully assess due to lethargy Coordination Gross Motor Movements are Fluid and Coordinated: No Fine Motor Movements are Fluid and Coordinated: No Coordination and Movement Description: impaired 2/2 L hemi Motor  Motor Motor: Hemiplegia;Abnormal tone;Abnormal postural alignment and control Motor - Skilled Clinical Observations: L hemi  Trunk/Postural Assessment  Cervical Assessment Cervical Assessment: Within Functional Limits Thoracic Assessment Thoracic Assessment: Exceptions to [facility] (rounded shoulders) Lumbar Assessment Lumbar Assessment: Exceptions to [facility] (posterior pelvic tilt) Postural Control Postural Control: Deficits on evaluation Trunk Control: impaired, L lateral lean in sitting and  standing Righting Reactions: delayed/impaired  Balance Balance Balance Assessed: Yes Static Sitting Balance Static Sitting - Balance Support: No upper extremity supported;Feet supported Static Sitting - Level of Assistance: 4: Min assist;5: Stand by assistance Dynamic Sitting Balance Dynamic Sitting - Balance Support: No upper extremity supported;Feet supported;During functional activity Dynamic Sitting - Level of Assistance: 4: Min Insurance risk surveyor Standing - Balance Support: Bilateral upper extremity supported;During functional activity Static Standing - Level of Assistance: 4: Min assist Dynamic Standing Balance Dynamic Standing - Balance Support: Bilateral upper extremity supported;During functional activity Dynamic Standing - Level of Assistance: 3: Mod assist Extremity Assessment   RLE Assessment RLE Assessment: Exceptions to [facility] General Strength Comments: impaired, see below RLE Strength Right Hip Flexion: 3+/5 Right Knee Flexion: 3+/5 Right Knee Extension: 4/5 Right Ankle Dorsiflexion: 5/5 LLE Assessment LLE Assessment: Exceptions to [facility] General Strength Comments: impaired, see below LLE Strength Left Hip Flexion: 4/5 Left Knee Flexion: 2+/5 Left Knee Extension: 3+/5 Left Ankle Dorsiflexion: 4/5  Care Tool Care Tool Bed Mobility Roll left and right activity   Roll left and right assist level: Minimal Assistance - Patient > 75%    Sit to lying activity   Sit to lying assist level: Moderate Assistance - Patient 50 - 74%    Lying to sitting edge of bed activity   Lying to sitting edge of bed assist level: Minimal Assistance - Patient > 75%     Care Tool Transfers Sit to stand transfer   Sit to stand assist level: Moderate Assistance - Patient 50 - 74%    Chair/bed transfer   Chair/bed transfer assist level: Moderate Assistance - Patient 50 - 74%     Psychologist, counselling transfer activity did not occur:  Safety/medical concerns        Care Tool Locomotion Ambulation   Assist level: Moderate Assistance - Patient 50 - 74% Assistive device: Walker-rolling Max distance: 10'  Walk 10 feet activity   Assist level: Moderate Assistance - Patient - 50 - 74% Assistive device: Walker-rolling   Walk 50 feet with 2 turns activity Walk 50 feet with 2 turns activity did not occur: Safety/medical concerns      Walk 150 feet activity Walk 150 feet activity did not occur: Safety/medical concerns      Walk 10 feet on uneven surfaces activity Walk 10 feet on uneven  surfaces activity did not occur: Safety/medical concerns      Stairs Stair activity did not occur: Safety/medical concerns        Walk up/down 1 step activity Walk up/down 1 step or curb (drop down) activity did not occur: Safety/medical concerns     Walk up/down 4 steps activity did not occuR: Safety/medical concerns  Walk up/down 4 steps activity      Walk up/down 12 steps activity Walk up/down 12 steps activity did not occur: Safety/medical concerns      Pick up small objects from floor Pick up small object from the floor (from standing position) activity did not occur: Safety/medical concerns      Wheelchair Will patient use wheelchair at discharge?:  (TBD) Type of Wheelchair: Manual   Wheelchair assist level: Dependent - Patient 0% Max wheelchair distance: 150'  Wheel 50 feet with 2 turns activity   Assist Level: Dependent - Patient 0%  Wheel 150 feet activity   Assist Level: Dependent - Patient 0%    Refer to Care Plan for Long Term Goals  SHORT TERM GOAL WEEK 1 PT Short Term Goal 1 (Week 1): Pt will complete least restrictive transfers with min A PT Short Term Goal 2 (Week 1): Pt will initiate stair training PT Short Term Goal 3 (Week 1): Pt will ambulate x 50 ft with LRAD and min A PT Short Term Goal 4 (Week 1): Pt will tolerate sitting OOB in chair x 1 hour  Recommendations for other services: None   Skilled  Therapeutic Intervention Evaluation completed (see details above and below) with education on PT POC and goals and individual treatment initiated with focus on functional mobility, transfer, and gait assessment. Pt received sidelying in bed asleep, arousable but remains lethargic throughout session. Bed mobility min A for some trunk control from flat bed. Pt exhibits L lateral lean in sitting. Sit to stand with mod A to RW. Stand pivot transfer with RW and mod A. Ambulation x 10 ft with RW and mod A for balance, L lateral lean, very slow gait speed, shuffling steps. Pt fatigues very quickly with activity this AM and full evaluation limited by lethargy. Pt found to be incontinent of urine, pt unaware. Squat pivot transfer back to bed with mod A. Sit to supine mod A for BLE management. Rolling L/R with min A for brief change and pericare, pt able to assist with pericare with setup A. Pt left supine in bed with needs in reach, bed alarm in place at end of session.  Mobility Bed Mobility Bed Mobility: Rolling Right;Rolling Left;Supine to Sit;Sit to Supine Rolling Right: Minimal Assistance - Patient > 75% Rolling Left: Minimal Assistance - Patient > 75% Supine to Sit: Minimal Assistance - Patient > 75% Sit to Supine: Moderate Assistance - Patient 50-74% Transfers Transfers: Chartered loss adjuster Transfers Stand Pivot Transfers: Moderate Assistance - Patient 50 - 74% Stand Pivot Transfer Details: Verbal cues for sequencing;Verbal cues for technique;Manual facilitation for weight shifting Squat Pivot Transfers: Moderate Assistance - Patient 50-74% Transfer (Assistive device): Tour manager (Feet): 10 Feet Assistive device: Rolling walker Gait Gait Pattern: Impaired (L lateral lean; shuffling gait pattern) Gait velocity: decreased Stairs / Additional Locomotion Stairs: No Wheelchair Mobility Wheelchair Mobility: No   Discharge Criteria: Patient will be  discharged from PT if patient refuses treatment 3 consecutive times without medical reason, if treatment goals not met, if there is a change in medical status, if patient makes no progress  towards goals or if patient is discharged from hospital.  The above assessment, treatment plan, treatment alternatives and goals were discussed and mutually agreed upon: by patient   Excell Seltzer, PT, DPT, CSRS 04/21/2021, 12:16 PM

## 2021-04-21 NOTE — Evaluation (Signed)
Speech Language Pathology Assessment and Plan  Patient Details  Name: Carolyn Russo MRN: [id number] Date of Birth: [birth year]  SLP Diagnosis: Dysarthria;Dysphagia;Cognitive Impairments  Rehab Potential: Good ELOS: 2-2.5 weeks    Today's Date: 04/21/2021 SLP Individual Time: 9735-3299 SLP Individual Time Calculation (min): 45 min and Today's Date: 04/21/2021 SLP Missed Time: 15 Minutes Missed Time Reason: Patient fatigue   Hospital Problem: Principal Problem:   Cerebrovascular accident (CVA) of right basal ganglia ([place])  Past Medical History:  Past Medical History:  Diagnosis Date   Allergy    Anterior chest wall pain    Appendicitis 1965   Asthma    Body mass index 37.0-37.9, adult    Breast pain    Cataract    both eyes   Chronic renal disease, stage II    Dehydration 2014   Deviated septum 1971   Diabetes mellitus    Dyspnea 2014   Extrinsic asthma    WITH ASTHMA ATTACK   Fibroid 1980   GERD (gastroesophageal reflux disease)    Heart murmur    Hx gestational diabetes    Hyperlipidemia    Hypertension 2014   Inguinal hernia 1959   Malaise and fatigue 2014   Nephropathy    Non-IgE mediated allergic asthma 2014   Obesity    Pelvic pain    Pregnancy, high-risk 1985   Tonsillitis 1968   Uterine fibroid 1980   Past Surgical History:  Past Surgical History:  Procedure Laterality Date   [place]   COLONOSCOPY     ESOPHAGOGASTRODUODENOSCOPY (EGD) WITH PROPOFOL N/A 04/18/2021   Procedure: ESOPHAGOGASTRODUODENOSCOPY (EGD) WITH PROPOFOL;  Surgeon: Gatha Mayer, MD;  Location: [facility] ENDOSCOPY;  Service: Endoscopy;  Laterality: N/A;   EYE SURGERY     bilateral cataract    FLEXIBLE SIGMOIDOSCOPY N/A 04/18/2021   Procedure: FLEXIBLE SIGMOIDOSCOPY;  Surgeon: Gatha Mayer, MD;  Location: [facility] ENDOSCOPY;  Service: Endoscopy;  Laterality: N/A;   HERNIA REPAIR  1959   IR FLUORO GUIDE CV LINE RIGHT  03/18/2021   IR US GUIDE VASC ACCESS  RIGHT  03/18/2021   LEFT HEART CATH AND CORONARY ANGIOGRAPHY N/A 04/04/2017   Procedure: Left Heart Cath and Coronary Angiography;  Surgeon: Belva Crome, MD;  Location: [facility];  Service: Cardiovascular;  Laterality: N/A;   MYOMECTOMY  1980, 2004, 2007   RHINOPLASTY  1971   ROTATOR CUFF REPAIR  2003   SURGICAL REPAIR OF HEMORRHAGE  2015   TONSILLECTOMY  1968    Assessment / Plan / Recommendation Clinical Impression Patient is a 70 year old right-handed female with history significant for hypertension, obesity, hyperlipidemia, and newly diagnosed end-stage renal disease on hemodialysis, diabetes mellitus, CAD, iron deficiency anemia and asthma.  Patient with latest admission 03/09/2021 to 03/24/2021 for acute on chronic diastolic congestive heart failure exacerbation.  Presented 04/16/2021 with left-sided weakness x3 days and slurred speech with bouts of vomiting. Cranial CT scan showed no acute abnormalities.  Patient did not receive tPA.  MRI of the brain showed a 2.1 cm acute ischemic nonhemorrhagic right basal ganglia infarction.  MRA angiogram of head and neck grossly negative no large vessel occlusion or high-grade stenosis.  Most recent echocardiogram with ejection fraction of 24% grade 2 diastolic dysfunction.   Hospital course gastroenterology Dr. Carlean Purl consulted 04/18/2021 after patient had multiple large bloody bowel movements with clots.  Underwent upper endoscopy 04/18/2021 showing normal esophagus and stomach and the cardia and gastric fundus normal  on retroflexion and also underwent flexible sigmoidoscopy showing hemorrhoids found on perianal exam external/internal hemorrhoids.  Patient had initially been placed on aspirin and Plavix for CVA prophylaxis held due to suspected lower GI bleed awaiting plan on resuming anticoagulation.  Follow-up renal services with hemodialysis ongoing as directed.  Currently on a soft diet advanced as tolerated.  Due to patient decreased functional  ability left side weakness was admitted for a comprehensive rehab program 04/20/21.  Patient extremely lethargic throughout session and required constant verbal and tactile cues for arousal, RN aware. Due to patient's lethargy, patient with minimal verbal responses to questions and required Max A multimodal cues to initiate functional tasks like self-feeding. Patient was oriented X 4 with decreased insight of deficits. Poor sustained attention and attention to left field of environment was also noted. With patient's minimal verbal output, patient demonstrated decreased left oral motor ROM and strength resulting in imprecise consonants but patient remained ~90% intelligible. Patient declined trials of solid textures but consumed thin liquids via straw without overt s/s of aspiration despite oral holding and delayed swallow initiation due to falling asleep with liquids in her oral cavity. Recommend patient remain on thin liquids but downgrade to Dys. 2 textures with full supervision with all meals. Patient would benefit from skilled SLP intervention to maximize his cognitive, swallowing and speech function prior to discharge.    Skilled Therapeutic Interventions          Patient extremely lethargic throughout evaluation, RN aware. Administered a limited BSE and cognitive-linguistic evaluation, please see above for details.   SLP Assessment  Patient will need skilled Speech Lanaguage Pathology Services during CIR admission    Recommendations  SLP Diet Recommendations: Dysphagia 2 (Fine chop);Thin Liquid Administration via: Cup;Straw Medication Administration: Whole meds with puree Supervision: Patient able to self feed;Full supervision/cueing for compensatory strategies Compensations: Slow rate;Small sips/bites;Minimize environmental distractions;Monitor for anterior loss Postural Changes and/or Swallow Maneuvers: Seated upright 90 degrees Oral Care Recommendations: Oral care BID Recommendations for  Other Services: Neuropsych consult Patient destination: Home Follow up Recommendations: 24 hour supervision/assistance;Home Health SLP Equipment Recommended: None recommended by SLP    SLP Frequency 3 to 5 out of 7 days   SLP Duration  SLP Intensity  SLP Treatment/Interventions 2-2.5 weeks  Minumum of 1-2 x/day, 30 to 90 minutes  Cognitive remediation/compensation;Dysphagia/aspiration precaution training;Internal/external aids;Speech/Language facilitation;Therapeutic Activities;Environmental controls;Cueing hierarchy;Functional tasks;Patient/family education    Pain No/Denies Pain   Prior Functioning Type of Home: House  Lives With: Spouse Available Help at Discharge: Family;Available PRN/intermittently Vocation: Retired  SLP Evaluation Cognition Overall Cognitive Status: Impaired/Different from baseline Arousal/Alertness: Lethargic Orientation Level: Oriented X4 Attention: Focused;Sustained Focused Attention: Impaired Focused Attention Impairment: Verbal basic;Functional basic Sustained Attention: Impaired Sustained Attention Impairment: Functional basic;Verbal basic Awareness: Impaired Safety/Judgment: Impaired  Comprehension Auditory Comprehension Overall Auditory Comprehension: Appears within functional limits for tasks assessed Visual Recognition/Discrimination Discrimination: Not tested Reading Comprehension Reading Status: Not tested Expression Expression Primary Mode of Expression: Verbal Verbal Expression Overall Verbal Expression: Impaired Initiation: Impaired Pragmatics: Impairment Written Expression Dominant Hand: Right Written Expression: Not tested Oral Motor Oral Motor/Sensory Function Overall Oral Motor/Sensory Function: Mild impairment Motor Speech Overall Motor Speech: Impaired Respiration: Within functional limits Phonation: Normal Resonance: Within functional limits Articulation: Impaired Intelligibility: Intelligible Motor Planning:  Witnin functional limits Effective Techniques: Over-articulate  Care Tool Care Tool Cognition Expression of Ideas and Wants Expression of Ideas and Wants: Some difficulty - exhibits some difficulty with expressing needs and ideas (e.g, some words or finishing thoughts) or speech  is not clear   Understanding Verbal and Non-Verbal Content Understanding Verbal and Non-Verbal Content: Usually understands - understands most conversations, but misses some part/intent of message. Requires cues at times to understand   Memory/Recall Ability *first 3 days only Memory/Recall Ability *first 3 days only: Current season;Location of own room;Staff names and faces;That he or she is in a hospital/hospital unit     Bedside Swallowing Assessment General Date of Onset: 04/16/21 Previous Swallow Assessment: BSE: Initially made NPO but upgraded to Dys. 3 textures with thin liquids Diet Prior to this Study: Dysphagia 3 (soft);Thin liquids Temperature Spikes Noted: No Respiratory Status: Room air History of Recent Intubation: No Behavior/Cognition: Lethargic/Drowsy;Requires cueing Oral Cavity - Dentition: Adequate natural dentition Self-Feeding Abilities: Total assist Vision: Impaired for self-feeding Patient Positioning: Upright in bed Baseline Vocal Quality: Normal Volitional Cough: Cognitively unable to elicit Volitional Swallow: Unable to elicit  Thin Liquid Thin Liquid: Impaired Presentation: Straw Oral Phase Functional Implications: Oral holding Pharyngeal  Phase Impairments: Suspected delayed Swallow Other Comments: lethargic and required cues to swallow due to falling asleep with liquid in her oral cavity Nectar Thick Nectar Thick Liquid: Not tested Honey Thick Honey Thick Liquid: Not tested Puree Puree: Not tested Other Comments: patient declined Solid Solid: Not tested BSE Assessment Risk for Aspiration Impact on safety and function: Moderate aspiration risk Other Related Risk  Factors: History of GERD;Deconditioning;Lethargy;Cognitive impairment  Short Term Goals: Week 1: SLP Short Term Goal 1 (Week 1): Patient will maintain sustained attention for 5 minute increments during a functional task with Max A multimodal cues. SLP Short Term Goal 2 (Week 1): Patient will attend to left field of enviornment during functional tasks with Mod A verbal cues. SLP Short Term Goal 3 (Week 1): Patient will initiate verbal responses in 75% of opportunities with Max A multimodal cues. SLP Short Term Goal 4 (Week 1): Patient will consume current diet with minimal overt s/s of aspiration with Mod verbal cues for use of swallowing compensastory strategies. SLP Short Term Goal 5 (Week 1): Patient will demosntrate efficient mastication and oral clearance with trials of Dys. 3 textures over 2 sessions prior to upgrade with Mod verbal cues. SLP Short Term Goal 6 (Week 1): Patient will utilize speech intelligibility strategies at the sentence level with Mod verbal cues.  Refer to Care Plan for Long Term Goals  Recommendations for other services: Neuropsych  Discharge Criteria: Patient will be discharged from SLP if patient refuses treatment 3 consecutive times without medical reason, if treatment goals not met, if there is a change in medical status, if patient makes no progress towards goals or if patient is discharged from hospital.  The above assessment, treatment plan, treatment alternatives and goals were discussed and mutually agreed upon: No family available/patient unable  [place], [place] 04/21/2021, 12:03 PM

## 2021-04-22 ENCOUNTER — Inpatient Hospital Stay (HOSPITAL_COMMUNITY): Payer: Medicare Other

## 2021-04-22 DIAGNOSIS — D72825 Bandemia: Secondary | ICD-10-CM

## 2021-04-22 LAB — CBC WITH DIFFERENTIAL/PLATELET
Abs Immature Granulocytes: 0.04 10*3/uL (ref 0.00–0.07)
Basophils Absolute: 0 10*3/uL (ref 0.0–0.1)
Basophils Relative: 0 %
Eosinophils Absolute: 0.2 10*3/uL (ref 0.0–0.5)
Eosinophils Relative: 2 %
HCT: 28.3 % — ABNORMAL LOW (ref 36.0–46.0)
Hemoglobin: 9.4 g/dL — ABNORMAL LOW (ref 12.0–15.0)
Immature Granulocytes: 0 %
Lymphocytes Relative: 31 %
Lymphs Abs: 3.8 10*3/uL (ref 0.7–4.0)
MCH: 30.1 pg (ref 26.0–34.0)
MCHC: 33.2 g/dL (ref 30.0–36.0)
MCV: 90.7 fL (ref 80.0–100.0)
Monocytes Absolute: 1.4 10*3/uL — ABNORMAL HIGH (ref 0.1–1.0)
Monocytes Relative: 11 %
Neutro Abs: 7 10*3/uL (ref 1.7–7.7)
Neutrophils Relative %: 56 %
Platelets: 251 10*3/uL (ref 150–400)
RBC: 3.12 MIL/uL — ABNORMAL LOW (ref 3.87–5.11)
RDW: 15.6 % — ABNORMAL HIGH (ref 11.5–15.5)
WBC: 12.5 10*3/uL — ABNORMAL HIGH (ref 4.0–10.5)
nRBC: 0 % (ref 0.0–0.2)

## 2021-04-22 IMAGING — DX DG CHEST 2V
2 series · 2 of 2 positions shown · non-contrast
Comparison: [DATE].

CLINICAL DATA: Leukocytosis.

EXAM:
CHEST - 2 VIEW

[chest lat]
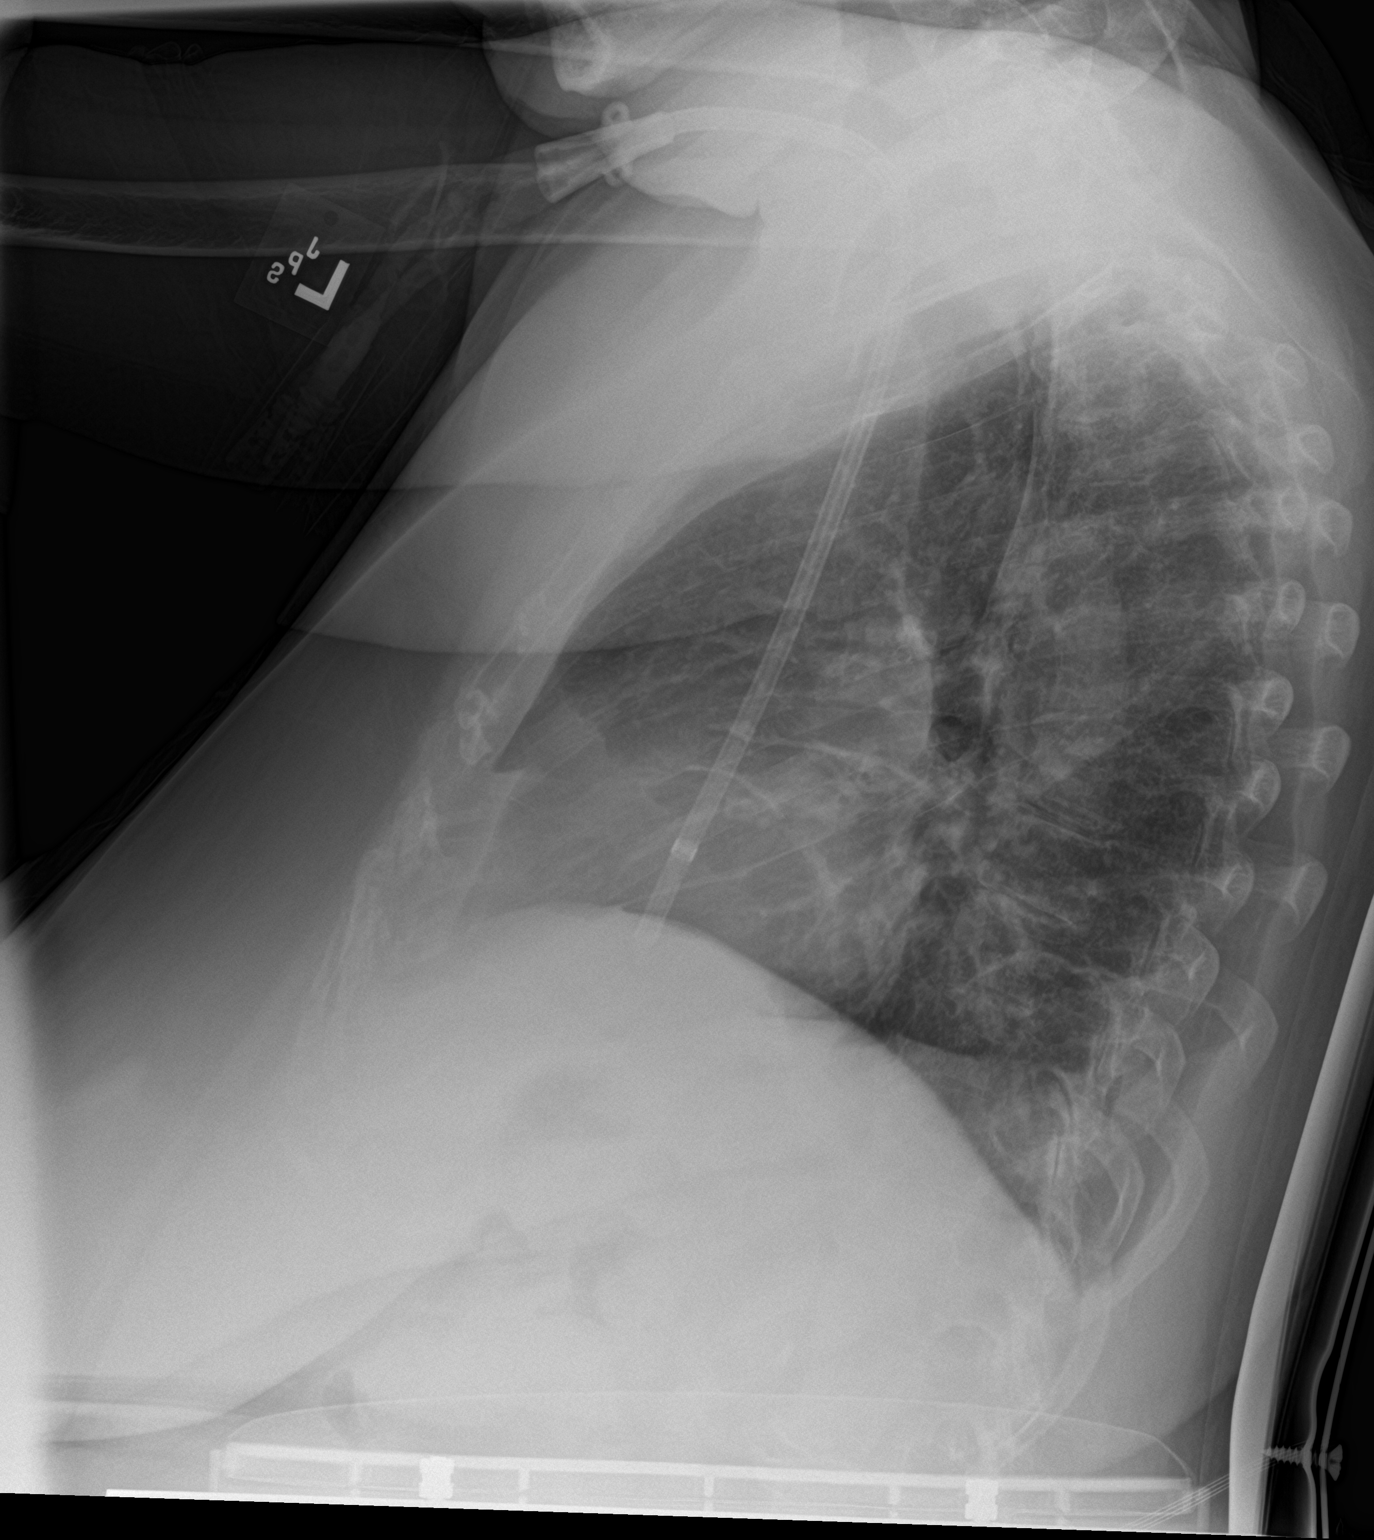

[chest ap]
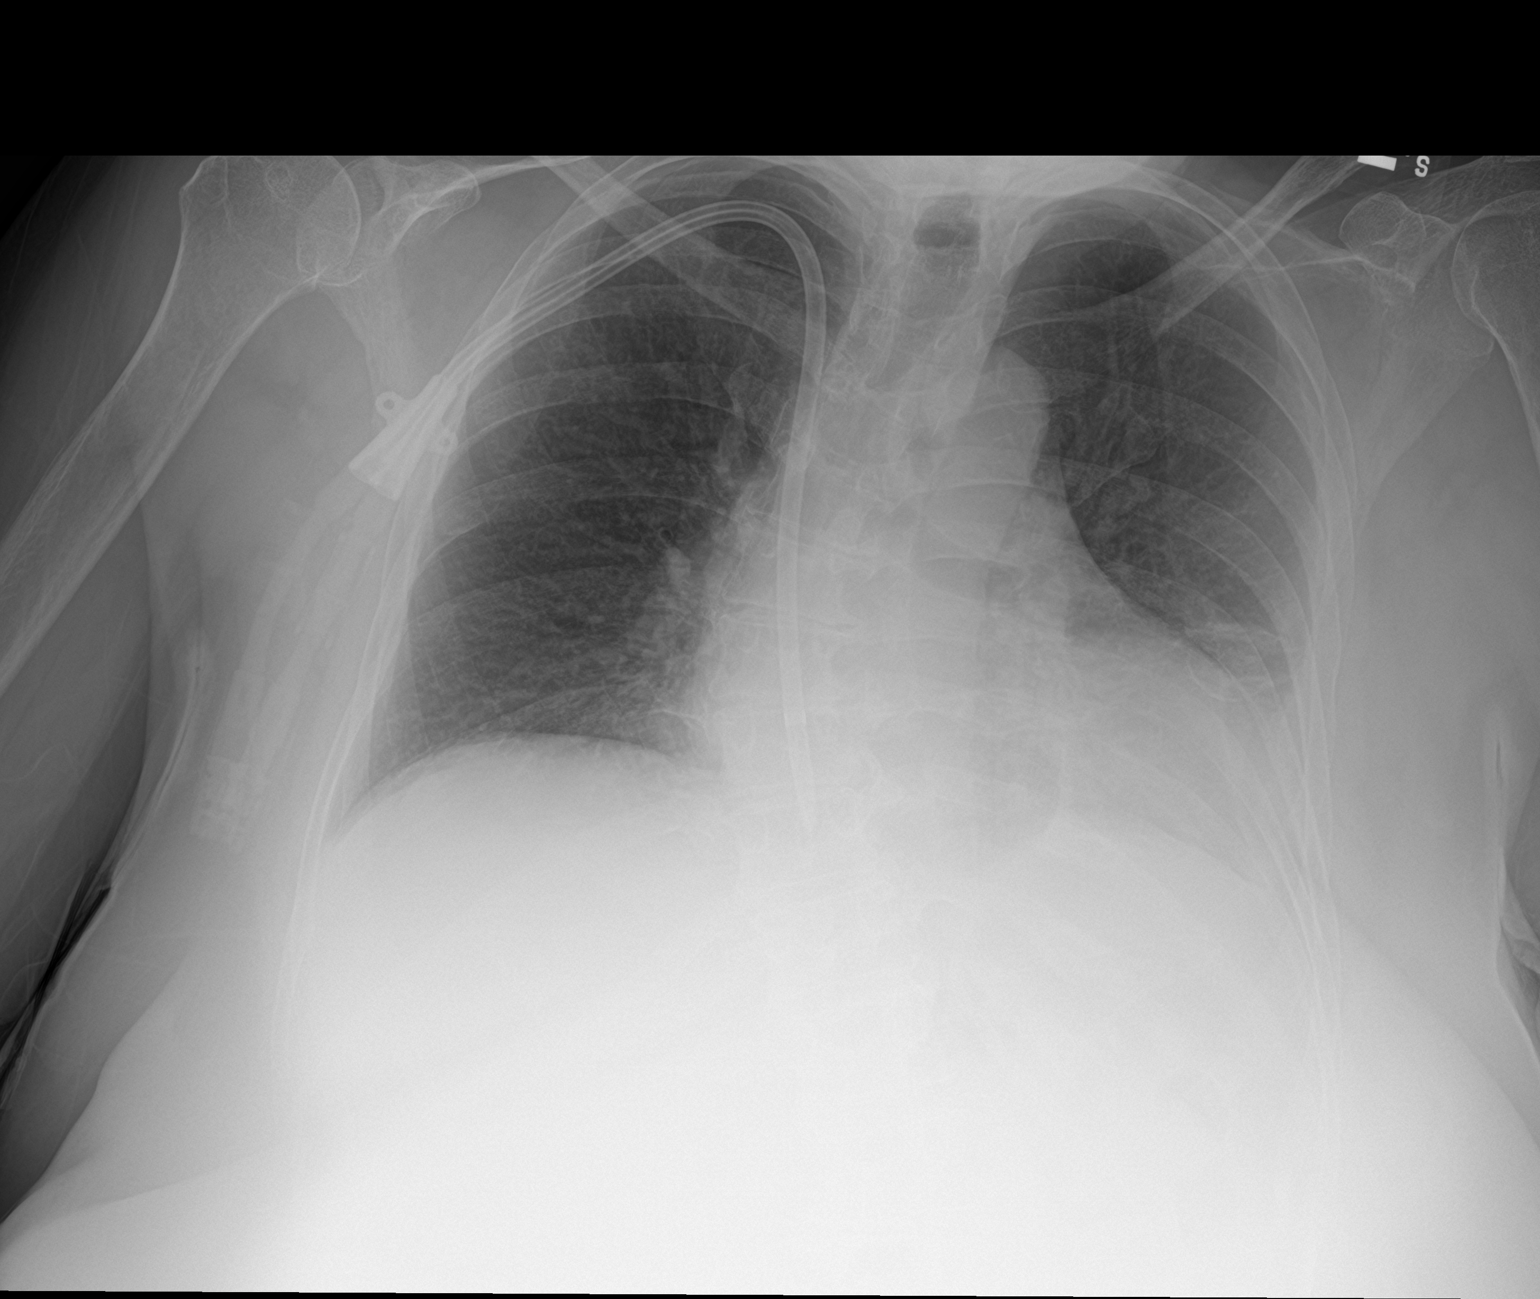

[2 of 2 positions shown; findings below may reference images not displayed]

FINDINGS: Stable cardiomediastinal silhouette. Right internal jugular dialysis
catheter is unchanged. No pneumothorax is noted. Minimal left
basilar subsegmental atelectasis is noted. Right lung is clear. Bony
thorax is unremarkable.
IMPRESSION: Minimal left basilar subsegmental atelectasis.

## 2021-04-22 MED ORDER — KETOTIFEN FUMARATE 0.025 % OP SOLN
2.0000 [drp] | Freq: Two times a day (BID) | OPHTHALMIC | Status: DC
  Administered 2021-04-22 – 2021-05-05 (×28): 2 [drp] via OPHTHALMIC
  Filled 2021-04-22: qty 5

## 2021-04-22 MED ORDER — FLUTICASONE PROPIONATE 50 MCG/ACT NA SUSP
2.0000 | Freq: Every day | NASAL | Status: DC
  Administered 2021-04-22 – 2021-05-04 (×13): 2 via NASAL
  Filled 2021-04-22: qty 16

## 2021-04-22 NOTE — Progress Notes (Addendum)
PROGRESS NOTE   Subjective/Complaints:  Pt had bleeding from hemorrhoids- and having rectal pain-   Also has O2- getting it for home. Was needed.  Wearing 2L O2-  Also itching and burning of eyes- and nose -nasal drip.    ROS:  Pt denies SOB, abd pain, CP, N/V/C/D, and vision changes    Objective:   No results found. Recent Labs    04/20/21 1002 04/22/21 0502  WBC 13.7* 12.5*  HGB 9.8* 9.4*  HCT 29.4* 28.3*  PLT 246 251   No results for input(s): NA, K, CL, CO2, GLUCOSE, BUN, CREATININE, CALCIUM in the last 72 hours.   Intake/Output Summary (Last 24 hours) at 04/22/2021 0901 Last data filed at 04/21/2021 1623 Gross per 24 hour  Intake --  Output 2000 ml  Net -2000 ml        Physical Exam: Vital Signs Blood pressure (!) 145/78, pulse 66, temperature (!) 97.4 F (36.3 C), temperature source Oral, resp. rate 19, height 5\' 2"  (1.575 m), weight 82 kg, SpO2 99 %.     General: awake, alert, appropriate, sitting up in bed- wearing 2L O2; NAD HENT: conjugate gaze; oropharynx moist- rubbing eyes constantly- and nasal drainage - sniffing.  CV: regular rate; no JVD Pulmonary: CTA B/L; no W/R/R- good air movement- decreased at bases B/L GI: soft, NT, ND, (+)BS Psychiatric: very flat Neurological: alert  Musculoskeletal:        General: No swelling.    Cervical back: Normal range of motion.    Comments: Mild tenderness left shoulder with ER/IR today  Skin:    General: Skin is warm and dry. Neurological:    Comments: Patient is alert.  No acute distress.  Makes eye contact with examiner.  Provides name and age.  Follows commands.  Fair insight and awareness. Provided biographical information. Normal language, speech slightly slurred. Mild left central 7. LUE 2 to 2+/5 deltoid, 3/5 biceps, triceps and 3+/5 wrist and HI. LLE 3-4/5 prox to distal. RUE and RLE 4-5/5 prox to distal. No sensory deficits. No abnl  tone. Assessment/Plan: 1. Functional deficits which require 3+ hours per day of interdisciplinary therapy in a comprehensive inpatient rehab setting. Physiatrist is providing close team supervision and 24 hour management of active medical problems listed below. Physiatrist and rehab team continue to assess barriers to discharge/monitor patient progress toward functional and medical goals  Care Tool:  Bathing    Body parts bathed by patient: Right arm, Left arm, Chest, Abdomen, Face   Body parts bathed by helper: Front perineal area, Buttocks, Right upper leg, Left upper leg, Right lower leg, Left lower leg     Bathing assist Assist Level: Maximal Assistance - Patient 24 - 49%     Upper Body Dressing/Undressing Upper body dressing   What is the patient wearing?: Pull over shirt    Upper body assist Assist Level: Moderate Assistance - Patient 50 - 74%    Lower Body Dressing/Undressing Lower body dressing      What is the patient wearing?: Pants     Lower body assist Assist for lower body dressing: Maximal Assistance - Patient 25 - 49%     Toileting Toileting  Toileting assist Assist for toileting: Maximal Assistance - Patient 25 - 49%     Transfers Chair/bed transfer  Transfers assist     Chair/bed transfer assist level: Moderate Assistance - Patient 50 - 74%     Locomotion Ambulation   Ambulation assist      Assist level: Moderate Assistance - Patient 50 - 74% Assistive device: Walker-rolling Max distance: 10'   Walk 10 feet activity   Assist     Assist level: Moderate Assistance - Patient - 50 - 74% Assistive device: Walker-rolling   Walk 50 feet activity   Assist Walk 50 feet with 2 turns activity did not occur: Safety/medical concerns         Walk 150 feet activity   Assist Walk 150 feet activity did not occur: Safety/medical concerns         Walk 10 feet on uneven surface  activity   Assist Walk 10 feet on uneven  surfaces activity did not occur: Safety/medical concerns         Wheelchair     Assist Will patient use wheelchair at discharge?:  (TBD) Type of Wheelchair: Manual    Wheelchair assist level: Dependent - Patient 0% Max wheelchair distance: 150'    Wheelchair 50 feet with 2 turns activity    Assist        Assist Level: Dependent - Patient 0%   Wheelchair 150 feet activity     Assist      Assist Level: Dependent - Patient 0%   Blood pressure (!) 145/78, pulse 66, temperature (!) 97.4 F (36.3 C), temperature source Oral, resp. rate 19, height 5\' 2"  (1.575 m), weight 82 kg, SpO2 99 %.  Medical Problem List and Plan: 1.  Left side weakness secondary to nonhemorrhagic right basal ganglia infarction             -patient may shower             -ELOS/Goals: 10-12 days, mod I goals for PT, OT, SLP  -first day of evaluations of therapies- PT, OT and SLP  -con't PT, and OT and SLP 2.  Antithrombotics: -DVT/anticoagulation:  SCD             -antiplatelet therapy: PLAN IS TO RESUME ASPIRIN ONLY 81 MG DAILY IN 5-7 DAYS IF NO FURTHER BLEEDING 3. Pain Management: Tramadol 50 mg every 12 hours as needed  6/29- c/o R hip bursitis- will try Lidoderm patch R hip 8am to 8pm- see if that helps- might try injection of steroid?  6/30- pain a little better- con't regimen 4. Mood: Xanax 0.5 mg 3 times daily as needed.  Provide emotional support             -antipsychotic agents: N/A 5. Neuropsych: This patient is capable of making decisions on her own behalf. 6. Skin/Wound Care: Routine skin checks 7. Fluids/Electrolytes/Nutrition: Routine in and outs with follow-up chemistries 8.  Acute lower GI bleed with blood loss anemia.  Follow-up GI services.  Resume ASA as above. - Continue Protonix 40 mg twice daily  6/30- Hb 9.4 from 9.8- so overall stable- but wil monitor- due to passing BRBPR/and clots-  9.  Chronic diastolic congestive heart failure.  Monitor for any signs of fluid  overload.  Continue Lasix 40-80 mg Monday Wednesday Friday             -daily weights 10.  Newly Dx'd End-stage renal disease.  Hemodialysis as per renal services 11.  Hypertension.  Coreg 3.125  mg twice daily  6/29- BP adequate control- con't regimen 12.  Asthma.  Continue inhalers as directed  6/30- was getting Home O2 prior to admission- will likely need to go home on 2L O2- will likely need to order. Was trying to get at home, but hadn't yet.   13.Hyperlipidemia.Crestor 14.Obesity.BMI 34.31.Dietery follow up 15.  Diabetes mellitus.hemoglobin AIc 5.2.  Blood sugar checks discontinued  16. Leukocytosis  6/29- WBC up to 13.7- don't have anything to compare to - will recheck in AM and determine cause if still elevated.   6/30- down to 12.5- no Sx's of infection- will monitor- afebrile of note- will check CXR PA and lateral for this and recheck labs in AM 17. Constipation  6/29- LBM 3+ days ago- will order Sorbitol 60cc after therapy- also changed senokot to 1 tab nightly from prn. 6/30- LBM yesterday after sorbitol- con't regimen   18. Fistula placement  6/30- will call [facility] to see if can do while here in rehab- or move til after d/c.    I spent a total of 40 minutes on pt today- >50% coordination of care, speaking to husband/physician on phone, will give number, and also speaking with PA about fistula.     LOS: 2 days A FACE TO FACE EVALUATION WAS PERFORMED  Carolyn Russo 04/22/2021, 9:01 AM

## 2021-04-22 NOTE — Progress Notes (Signed)
Speech Language Pathology Daily Session Note  Patient Details  Name: Carolyn Russo MRN: [id number] Date of Birth: 09-04-1951  Today's Date: 04/22/2021 SLP Individual Time: 1045-1130 SLP Individual Time Calculation (min): 45 min  Short Term Goals: Week 1: SLP Short Term Goal 1 (Week 1): Patient will maintain sustained attention for 5 minute increments during a functional task with Max A multimodal cues. SLP Short Term Goal 2 (Week 1): Patient will attend to left field of enviornment during functional tasks with Mod A verbal cues. SLP Short Term Goal 3 (Week 1): Patient will initiate verbal responses in 75% of opportunities with Max A multimodal cues. SLP Short Term Goal 4 (Week 1): Patient will consume current diet with minimal overt s/s of aspiration with Mod verbal cues for use of swallowing compensastory strategies. SLP Short Term Goal 5 (Week 1): Patient will demosntrate efficient mastication and oral clearance with trials of Dys. 3 textures over 2 sessions prior to upgrade with Mod verbal cues. SLP Short Term Goal 6 (Week 1): Patient will utilize speech intelligibility strategies at the sentence level with Mod verbal cues.  Skilled Therapeutic Interventions:   Patient seen for skilled ST session focusing on speech and cognitive goals. Patient was alert and sitting in [facility] when SLP arrived in room and was agreeable to going to speech therapy room down the hall. When in room, patient was alert and promptly responded, however after a few minutes, she would then have difficulty staying alert, requiring modA cues for alertness overall. Patient's speech was approximately 80% intelligible when context known during structured conversation. 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. She stated that she has been retired for "about one year" and she now mainly likes spending time with her grandchildren. Patient  would give brief responses to SLP's questions, requiring elaboration and f/u questions. She did not demonstrate awareness to change in solids textures (from Dys 3 to Dys 2). SLP unable to complete formal assessment due to patient continuing with lethargy. Patient continues to benefit from skilled SLP intervention to maximize cognitive, speech and swallow function prior to discharge.   Pain Pain Assessment Pain Scale: 0-10 Faces Pain Scale: No hurt  Therapy/Group: Individual Therapy  Sonia Baller, MA, CCC-SLP Speech Therapy

## 2021-04-22 NOTE — Progress Notes (Signed)
Occupational Therapy Session Note  Patient Details  Name: Carolyn Russo MRN: [id number] Date of Birth: 09/08/1951  Today's Date: 04/22/2021 OT Individual Time: 0900-1001 OT Individual Time Calculation (min): 61 min    Short Term Goals: Week 1:  OT Short Term Goal 1 (Week 1): Pt transfers to toilet wiht MIN A OT Short Term Goal 2 (Week 1): Pt will stand to groom at sink wiht MIN A OT Short Term Goal 3 (Week 1): Pt will don shirt wiht MIN A OT Short Term Goal 4 (Week 1): Pt will recall hemi dressing strategies wiht min VC  Skilled Therapeutic Interventions/Progress Updates:    Pt in bed sleeping to start with increased stimulation needed to wake her up and maintain sustained attention.  O2 sats at 100% on 2Ls nasal cannula.  O2 removed and it remained at 93-95% on room air throughout session.  Min assist for supine to sit with mod assist for stand pivot transfer to the wheelchair with no device.  She was able to complete bathing sit to stand with overall min assist.  At times when she would pause and stop working she would again close her eyes and start to drift off to sleep.  She required overall mod instructional cueing to maintain sustained attention because of this, but could verbalize next steps to complete during bathing and dressing tasks.  Mod instructional cueing with supervision to donn pullover shirt secondary to having it backwards and not demonstrating awareness of it.  She was able to donn her brief and pants with min assist sit to stand as well as gripper socks with supervision.  She was oriented to place, time, and situation as well.  Finished session with pt sitting up in the wheelchair with the safety belt in place and the call button and phone in reach.    Therapy Documentation Precautions:  Precautions Precautions: Fall Restrictions Weight Bearing Restrictions: No  Pain: Pain Assessment Pain Scale: Faces Pain Score: 0-No pain Faces Pain Scale: Hurts a little bit Pain  Type: Acute pain Pain Location: Rectum Pain Descriptors / Indicators: Discomfort Pain Onset: On-going Pain Intervention(s): Repositioned;Emotional support ADL: See Care Tool Section for some details of mobility and selfcare  Therapy/Group: Individual Therapy  Daziyah Cogan OTR/L 04/22/2021, 12:40 PM

## 2021-04-22 NOTE — Progress Notes (Signed)
Occupational Therapy Session Note  Patient Details  Name: Carolyn Russo MRN: [id number] Date of Birth: 07/24/1951  Today's Date: 04/22/2021 OT Individual Time: 1400-1430 OT Individual Time Calculation (min): 30 min    Short Term Goals: Week 1:  OT Short Term Goal 1 (Week 1): Pt transfers to toilet wiht MIN A OT Short Term Goal 2 (Week 1): Pt will stand to groom at sink wiht MIN A OT Short Term Goal 3 (Week 1): Pt will don shirt wiht MIN A OT Short Term Goal 4 (Week 1): Pt will recall hemi dressing strategies wiht min VC  Skilled Therapeutic Interventions/Progress Updates:    Pt sitting up in w/c, no c/o pain, appearing slightly drowsy.  Pt agreeable to working with OT.  Pt donned socks and shoes needing increased time and supervision for occasional problem solving.  Also noted decreased precision and coordination in left hand compared to right when tying shoe.  Pt transported to dayroom and completed Box and Blocks assessment with the following results:  Left hand 13 blocks; Right hand 22 blocks (one minute duration).  Pt exhibits impaired fine motor control in left hand compared to right.  Pt reports she does not have any numbness in left hand.  Pt transported to room with direct hand off to PT.    Therapy Documentation Precautions:  Precautions Precautions: Fall Restrictions Weight Bearing Restrictions: No    Therapy/Group: Individual Therapy  Ezekiel Slocumb 04/22/2021, 4:00 PM

## 2021-04-22 NOTE — Progress Notes (Signed)
Physical Therapy Session Note  Patient Details  Name: Carolyn FIRST MRN: [id number] Date of Birth: Sep 09, 1951  Today's Date: 04/22/2021 PT Individual Time: 1430-1526 PT Individual Time Calculation (min): 56 min   Short Term Goals: Week 1:  PT Short Term Goal 1 (Week 1): Pt will complete least restrictive transfers with min A PT Short Term Goal 2 (Week 1): Pt will initiate stair training PT Short Term Goal 3 (Week 1): Pt will ambulate x 50 ft with LRAD and min A PT Short Term Goal 4 (Week 1): Pt will tolerate sitting OOB in chair x 1 hour  Skilled Therapeutic Interventions/Progress Updates:    Pt received in [facility] in hallway from OT. Pt is agreeable to PT and reports no pain. Pt was not on O2 when received from OT.   Gait Training  -Pt ambulated 15 ft, 30 ft and 85f w/ RW and min A with intermittent rest breaks. Pt demonstrated decreased stance time on LLE, decreased foot clearance and decreased cadence.   Therapeutic Act -Car transfer w/ min A for LE management and VC for hand placement.  -10x 3 mini squats at mat w/ RW for improved LE strength. CGA for safety  -Ascended/ descended 4 steps w/ BUE support on rails and min A for steadying assist x2. Educated pt on proper leading leg for second set, pt was able to demonstrate and said she "felt stronger/more stable in her pelvic girdle"  -Educated pt on proper hand placement during sit <> [facility]  -[facility] with min A for safety.  -Sit to supine in bed with supervision and VC to get BLE on bed.   Pt presents with impaired gait, balance and cognition 2/2 CVA. Pt more energetic today and very pleasant to work with. Pt required min A 2/2 fatigue and incoordination. O2 sat checked multiple times, lowest being 94% spO2. Pt was left in bed with all needs met.   Therapy Documentation Precautions:  Precautions Precautions: Fall Restrictions Weight Bearing Restrictions: No   Vital Signs: Therapy Vitals Pulse  Rate: 73 Resp: 15 BP: (!) 147/67 Patient Position (if appropriate): Lying Oxygen Therapy SpO2: 100 % O2 Device: Room Air O2 Flow Rate (L/min): 2 L/min   Therapy/Group: Individual Therapy  Onis Markoff E Izaiah Tabb JCruzita LedererPlaster, PT, DPT  04/22/2021, 10:12 AM

## 2021-04-22 NOTE — Progress Notes (Signed)
[facility] Progress Note   Subjective:     Patient seen and examined at bedside. Currently working with PT. Patient reports feeling SOB during HD yesterday-removed 2.5L. She is currently on RA and not in acute distress. Denies CP, ABD pain, and N/V/D. Plan for HD 04/23/21.  Objective Vitals:   04/22/21 0317 04/22/21 0601 04/22/21 0717 04/22/21 0905  BP: 100/89 (!) 145/78  (!) 147/67  Pulse: 71 66  73  Resp: 19 19  15   Temp: (!) 97.4 F (36.3 C)     TempSrc: Oral     SpO2: 100% 100% 99% 100%  Weight: 82 kg     Height:       Physical Exam General: Ill-appearing female; responds to questions appropriately; NAD Heart: S1 and S2; No murmurs, gallops, or rubs Lungs: Clear throughout; No wheezing, rales, or rhonchi Abdomen: Soft and non-tender Extremities: No edema BLLE Dialysis Access: [facility]   Filed Weights   04/21/21 1306 04/21/21 1623 04/22/21 0317  Weight: 83.6 kg 82 kg 82 kg    Intake/Output Summary (Last 24 hours) at 04/22/2021 1003 Last data filed at 04/21/2021 1623 Gross per 24 hour  Intake --  Output 2000 ml  Net -2000 ml    Additional Objective Labs: Basic Metabolic Panel: Recent Labs  Lab 04/17/21 0224 04/18/21 1006 04/19/21 0413  NA 136 136 140  K 3.6 3.9 3.8  CL 97* 98 99  CO2 29 26 28   GLUCOSE 90 101* 87  BUN 16 27* 30*  CREATININE 4.08* 6.06* 6.39*  CALCIUM 8.8* 8.7* 8.7*  PHOS 3.7  --   --    Liver Function Tests: Recent Labs  Lab 04/17/21 0224  ALBUMIN 3.1*   No results for input(s): LIPASE, AMYLASE in the last 168 hours. CBC: Recent Labs  Lab 04/16/21 1215 04/17/21 0224 04/17/21 1504 04/18/21 1006 04/18/21 1621 04/20/21 0405 04/20/21 1002 04/22/21 0502  WBC 9.0 9.9  --  12.9*  --   --  13.7* 12.5*  NEUTROABS 4.2 3.6  --  6.2  --   --   --  7.0  HGB 9.0* 8.9*   < > 9.9*   < > 9.6* 9.8* 9.4*  HCT 27.3* 26.1*   < > 29.2*   < > 28.1* 29.4* 28.3*  MCV 88.1 85.9  --  87.7  --   --  89.1 90.7  PLT 282 267  --  233   --   --  246 251   < > = values in this interval not displayed.   Blood Culture No results found for: SDES, SPECREQUEST, CULT, REPTSTATUS  Cardiac Enzymes: No results for input(s): CKTOTAL, CKMB, CKMBINDEX, TROPONINI in the last 168 hours. CBG: Recent Labs  Lab 04/20/21 0117 04/20/21 0353 04/20/21 0751 04/20/21 1217 04/21/21 1553  GLUCAP 108* 117* 127* 174* 117*   Iron Studies: No results for input(s): IRON, TIBC, TRANSFERRIN, FERRITIN in the last 72 hours. Lab Results  Component Value Date   INR 1.0 04/14/2021   INR 1.0 03/31/2017   Studies/Results: No results found.  Medications:   arformoterol  15 mcg Nebulization BID   budesonide (PULMICORT) nebulizer solution  0.5 mg Nebulization BID   carvedilol  3.125 mg Oral BID WC   Chlorhexidine Gluconate Cloth  6 each Topical BID   fluticasone  2 spray Each Nare Daily   furosemide  40 mg Oral Q M,W,F   hydrocortisone  25 mg Rectal BID   ketotifen  2 drop Both Eyes  BID   lidocaine  1 patch Transdermal Q24H   pantoprazole  40 mg Oral BID   rosuvastatin  20 mg Oral Daily   senna-docusate  1 tablet Oral QHS   sevelamer carbonate  800 mg Oral TID WC    Dialysis Orders: [facility]-MWF 4 hrs 180NRe 400/Autoflow 1.5 86.5 kg 2.0K/2.0 Ca TDC -heparin 2000 units IV TIW -Mircera 100 mcg IV q 2 weeks (last dose 04/14/2021 -Hectorol 5 mcg IV TIW  Assessment/Plan: Acute subcortical R CVA-per Primary/Neurology Acute LGIB-H/O hemorrhoidal bleeding in past. new onset 06/25 post clopidogrel load. HGB 9.0 on admission, fell to 7.2. S/P 2 units PRBCS on 04/18/21. Hgb now 9.8. Clopidogrel and ASA on hold. No heparin with HD. Per GI: EGD and flex sig with no etiology and bleeding scan (-).  ESRD -  MWF-received HD overnight. DC Heparin-new GIB. Continue 4.0 K bath. K+ 3.8. Plan for HD today.  Hypertension/volume  - BP variable, elevated this morning. She is under EDW. No evidence of volume overload by exam. UF as tolerated-more likely will lower  EDW at discharge.  Anemia  - Hgb now 9.4. As noted in # 2. Follow HGB. Transfuse PRN. Metabolic bone disease -  No on clear liquid diet until cleared by swallow-evaluation. PTH only 110 03/26/21 on 5 mcg Hectorol. Hold VDRA.  Last PO4 at goal. Renvela 1 G PO TIW when able to eat. Nutrition - Now on clear liquid diet- Speech therapy consulted-awaiting swallow evaluation.  DTM2- per primary  Carolyn Poet, NP [facility] 04/22/2021,10:03 AM  LOS: 2 days

## 2021-04-23 ENCOUNTER — Telehealth: Payer: Self-pay

## 2021-04-23 ENCOUNTER — Ambulatory Visit: Payer: Medicare Other

## 2021-04-23 DIAGNOSIS — E1165 Type 2 diabetes mellitus with hyperglycemia: Secondary | ICD-10-CM

## 2021-04-23 DIAGNOSIS — I5033 Acute on chronic diastolic (congestive) heart failure: Secondary | ICD-10-CM

## 2021-04-23 DIAGNOSIS — N186 End stage renal disease: Secondary | ICD-10-CM

## 2021-04-23 LAB — CBC WITH DIFFERENTIAL/PLATELET
Abs Immature Granulocytes: 0.04 10*3/uL (ref 0.00–0.07)
Basophils Absolute: 0.1 10*3/uL (ref 0.0–0.1)
Basophils Relative: 0 %
Eosinophils Absolute: 0.3 10*3/uL (ref 0.0–0.5)
Eosinophils Relative: 3 %
HCT: 30 % — ABNORMAL LOW (ref 36.0–46.0)
Hemoglobin: 10 g/dL — ABNORMAL LOW (ref 12.0–15.0)
Immature Granulocytes: 0 %
Lymphocytes Relative: 32 %
Lymphs Abs: 3.7 10*3/uL (ref 0.7–4.0)
MCH: 30.1 pg (ref 26.0–34.0)
MCHC: 33.3 g/dL (ref 30.0–36.0)
MCV: 90.4 fL (ref 80.0–100.0)
Monocytes Absolute: 1.4 10*3/uL — ABNORMAL HIGH (ref 0.1–1.0)
Monocytes Relative: 12 %
Neutro Abs: 6.1 10*3/uL (ref 1.7–7.7)
Neutrophils Relative %: 53 %
Platelets: 268 10*3/uL (ref 150–400)
RBC: 3.32 MIL/uL — ABNORMAL LOW (ref 3.87–5.11)
RDW: 15.3 % (ref 11.5–15.5)
WBC: 11.5 10*3/uL — ABNORMAL HIGH (ref 4.0–10.5)
nRBC: 0 % (ref 0.0–0.2)

## 2021-04-23 LAB — RENAL FUNCTION PANEL
Albumin: 3.2 g/dL — ABNORMAL LOW (ref 3.5–5.0)
Anion gap: 8 (ref 5–15)
BUN: 14 mg/dL (ref 8–23)
CO2: 30 mmol/L (ref 22–32)
Calcium: 8.9 mg/dL (ref 8.9–10.3)
Chloride: 96 mmol/L — ABNORMAL LOW (ref 98–111)
Creatinine, Ser: 5.18 mg/dL — ABNORMAL HIGH (ref 0.44–1.00)
GFR, Estimated: 8 mL/min — ABNORMAL LOW (ref >60)
Glucose, Bld: 262 mg/dL — ABNORMAL HIGH (ref 70–99)
Phosphorus: 4.4 mg/dL (ref 2.5–4.6)
Potassium: 3.6 mmol/L (ref 3.5–5.1)
Sodium: 134 mmol/L — ABNORMAL LOW (ref 135–145)

## 2021-04-23 MED ORDER — ASPIRIN EC 81 MG PO TBEC
81.0000 mg | DELAYED_RELEASE_TABLET | Freq: Every day | ORAL | Status: DC
  Administered 2021-04-25 – 2021-05-05 (×11): 81 mg via ORAL
  Filled 2021-04-23 (×11): qty 1

## 2021-04-23 NOTE — IPOC Note (Signed)
Overall Plan of Care [facility]) Patient Details Name: Carolyn Russo MRN: [id number] DOB: 03-29-1951  Admitting Diagnosis: Cerebrovascular accident (CVA) of right basal ganglia [facility])  Hospital Problems: Principal Problem:   Cerebrovascular accident (CVA) of right basal ganglia ([place]) Active Problems:   ESRD on dialysis ([place])   Bandemia     Functional Problem List: Nursing Bowel, Endurance, Medication Management, Nutrition, Perception, Skin Integrity, Safety  PT Balance, Endurance, Motor, Safety, Sensory  OT Safety, Balance, Cognition, Edema, Endurance, Motor, Nutrition, Pain, Perception, Sensory  SLP Cognition, Linguistic, Nutrition  TR         Basic ADL's: OT Grooming, Bathing, Dressing, Toileting     Advanced  ADL's: OT       Transfers: PT Bed Mobility, Bed to Chair, Car, [employer], Futures trader, Metallurgist: PT Ambulation, Emergency planning/management officer, Stairs     Additional Impairments: OT    SLP Swallowing, Communication, Social Cognition expression Social Interaction, Problem Solving, Memory, Attention, Awareness  TR      Anticipated Outcomes Item Anticipated Outcome  Self Feeding S  Swallowing  Supervision   Basic self-care  S  Toileting  S   Bathroom Transfers S  Bowel/Bladder  Mod I with bowels  Transfers  Supervision  Locomotion  Supervision with LRAD  Communication  Mod I  Cognition  Min A  Pain  <3  Safety/Judgment  Mod I assist and no falls   Therapy Plan: PT Intensity: Minimum of 1-2 x/day ,45 to 90 minutes PT Frequency: 5 out of 7 days PT Duration Estimated Length of Stay: 12-14 days OT Intensity: Minimum of 1-2 x/day, 45 to 90 minutes OT Frequency: 5 out of 7 days OT Duration/Estimated Length of Stay: 2-2.5 weeks SLP Intensity: Minumum of 1-2 x/day, 30 to 90 minutes SLP Frequency: 3 to 5 out of 7 days SLP Duration/Estimated Length of Stay: 2-2.5 weeks   Due to the current state of emergency, patients may not be  receiving their 3-hours of Medicare-mandated therapy.   Team Interventions: Nursing Interventions Patient/Family Education, Bowel Management, Disease Management/Prevention, Medication Management, Skin Care/Wound Management, Discharge Planning, Psychosocial Support  PT interventions Ambulation/gait training, Training and development officer, Cognitive remediation/compensation, Community reintegration, Discharge planning, Disease management/prevention, DME/adaptive equipment instruction, Functional electrical stimulation, Functional mobility training, Neuromuscular re-education, Patient/family education, Psychosocial support, Splinting/orthotics, Stair training, Therapeutic Activities, Therapeutic Exercise, UE/LE Strength taining/ROM, UE/LE Coordination activities, Wheelchair propulsion/positioning  OT Interventions Balance/vestibular training, Discharge planning, Pain management, Self Care/advanced ADL retraining, Therapeutic Activities, UE/LE Coordination activities, Visual/perceptual remediation/compensation, Therapeutic Exercise, Skin care/wound managment, Patient/family education, Functional mobility training, Disease mangement/prevention, Cognitive remediation/compensation, Community reintegration, Engineer, drilling, Neuromuscular re-education, Psychosocial support, Splinting/orthotics, UE/LE Strength taining/ROM, Wheelchair propulsion/positioning  SLP Interventions Cognitive remediation/compensation, Dysphagia/aspiration precaution training, Internal/external aids, Speech/Language facilitation, Therapeutic Activities, Environmental controls, Cueing hierarchy, Functional tasks, Patient/family education  TR Interventions    SW/CM Interventions Discharge Planning, Psychosocial Support, Patient/Family Education   Barriers to Discharge MD  Medical stability, Home enviroment access/loayout, Wound care, Hemodialysis, Pending surgery, and New oxygen  Nursing Decreased caregiver support, Home  environment access/layout, Incontinence, Lack of/limited family support, Wound Care, Hemodialysis, Medication compliance, Behavior, Nutrition means 2 level home, 4 steps to enter, bed/bath on main level.  PT Decreased caregiver support, Home environment access/layout, Incontinence, Lack of/limited family support, Hemodialysis    OT Inaccessible home environment, Decreased caregiver support    SLP      SW Decreased caregiver support, Lack of/limited family support     Team Discharge Planning: Destination: PT-Home ,OT-  Home , SLP-Home Projected Follow-up: PT-Home health PT, 24 hour supervision/assistance, OT-  Home health OT, SLP-24 hour supervision/assistance, Home Health SLP Projected Equipment Needs: PT-Rolling walker with 5" wheels, To be determined, OT- To be determined, SLP-None recommended by SLP Equipment Details: PT-TBD pending progress, OT-  Patient/family involved in discharge planning: PT- Patient,  OT-Patient, SLP-Patient unable/family or caregive not available  MD ELOS: 12-15 days Medical Rehab Prognosis:  Good Assessment: [name] with newly dx'd ESRD on HD who is here for R basal ganglia infarct  With lower GI bleed- restarting ASA- and R hip bursitis- who limits therapy- getting HD fistula next week- scheduled-  Goals supervision to min A   See Team Conference Notes for weekly updates to the plan of care

## 2021-04-23 NOTE — Progress Notes (Signed)
[facility] Progress Note   Subjective:   Patient seen and examined at bedside in dialysis.  Reports being tired from therapy this morning.  No bleeding/melena overnight. Denies CP, SOB, n/v/d, and abdominal pain.   Objective Vitals:   04/23/21 0445 04/23/21 0836 04/23/21 1212 04/23/21 1304  BP: (!) 154/73 136/69 119/65 135/81  Pulse: 66 70 68 76  Resp: 18  16 16   Temp: 97.8 F (36.6 C) 97.6 F (36.4 C)  97.8 F (36.6 C)  TempSrc: Oral Oral    SpO2: 99% 100% 99% 100%  Weight: 80.4 kg     Height:       Physical Exam General:well appearing female in NAD, laying in bed Heart:RRR, no mrg Lungs:CTAB anterolaterally, nml WOB Abdomen:soft, NTND Extremities:trace LE edema Dialysis Access: TDC in use   Filed Weights   04/21/21 1623 04/22/21 0317 04/23/21 0445  Weight: 82 kg 82 kg 80.4 kg    Intake/Output Summary (Last 24 hours) at 04/23/2021 1353 Last data filed at 04/23/2021 0843 Gross per 24 hour  Intake 480 ml  Output --  Net 480 ml    Additional Objective Labs: Basic Metabolic Panel: Recent Labs  Lab 04/17/21 0224 04/18/21 1006 04/19/21 0413 04/23/21 0436  NA 136 136 140 134*  K 3.6 3.9 3.8 3.6  CL 97* 98 99 96*  CO2 29 26 28 30   GLUCOSE 90 101* 87 262*  BUN 16 27* 30* 14  CREATININE 4.08* 6.06* 6.39* 5.18*  CALCIUM 8.8* 8.7* 8.7* 8.9  PHOS 3.7  --   --  4.4   Liver Function Tests: Recent Labs  Lab 04/17/21 0224 04/23/21 0436  ALBUMIN 3.1* 3.2*   CBC: Recent Labs  Lab 04/17/21 0224 04/17/21 1504 04/18/21 1006 04/18/21 1621 04/20/21 1002 04/22/21 0502 04/23/21 0436  WBC 9.9  --  12.9*  --  13.7* 12.5* 11.5*  NEUTROABS 3.6  --  6.2  --   --  7.0 6.1  HGB 8.9*   < > 9.9*   < > 9.8* 9.4* 10.0*  HCT 26.1*   < > 29.2*   < > 29.4* 28.3* 30.0*  MCV 85.9  --  87.7  --  89.1 90.7 90.4  PLT 267  --  233  --  246 251 268   < > = values in this interval not displayed.    CBG: Recent Labs  Lab 04/20/21 0117 04/20/21 0353  04/20/21 0751 04/20/21 1217 04/21/21 1553  GLUCAP 108* 117* 127* 174* 117*   Studies/Results: DG Chest 2 View  Result Date: 04/22/2021 CLINICAL DATA:  Leukocytosis. EXAM: CHEST - 2 VIEW COMPARISON:  April 16, 2021. FINDINGS: Stable cardiomediastinal silhouette. Right internal jugular dialysis catheter is unchanged. No pneumothorax is noted. Minimal left basilar subsegmental atelectasis is noted. Right lung is clear. Bony thorax is unremarkable. IMPRESSION: Minimal left basilar subsegmental atelectasis. Electronically Signed   By: Marijo Conception M.D.   On: 04/22/2021 14:04    Medications:   arformoterol  15 mcg Nebulization BID   [START ON 04/25/2021] aspirin EC  81 mg Oral Daily   budesonide (PULMICORT) nebulizer solution  0.5 mg Nebulization BID   carvedilol  3.125 mg Oral BID WC   Chlorhexidine Gluconate Cloth  6 each Topical BID   fluticasone  2 spray Each Nare Daily   furosemide  40 mg Oral Q M,W,F   hydrocortisone  25 mg Rectal BID   ketotifen  2 drop Both Eyes BID   lidocaine  1 patch  Transdermal Q24H   pantoprazole  40 mg Oral BID   rosuvastatin  20 mg Oral Daily   senna-docusate  1 tablet Oral QHS   sevelamer carbonate  800 mg Oral TID WC    Dialysis Orders: [facility] 4 hrs 180NRe 400/Autoflow 1.5 86.5 kg 2.0K/2.0 Ca TDC -heparin 2000 units IV TIW -Mircera 100 mcg IV q 2 weeks (last dose 04/14/2021 -Hectorol 5 mcg IV TIW   Assessment/Plan: Acute subcortical R CVA-per Primary/Neurology Acute LGIB-H/O hemorrhoidal bleeding in past. new onset 06/25 post clopidogrel load. HGB 9.0 drop 7.2, improved to 10.0  S/P 2 units PRBCS on 04/18/21. Clopidogrel and ASA on hold. No heparin with HD. Per GI: EGD and flex sig with no etiology and bleeding scan (-).  ESRD -  MWF.  HD today per regular schedule. DC Heparin d/t new GIB. Continue 4.0 K bath. K+ 3.6.   Hypertension/volume  - BP mostly well controlled today. Well under EDW by weights here. No evidence of volume overload by exam. UF  as tolerated.  Weight now 80-82kg, will need lower dry on d/c.  Anemia - Hgb 10.0. As noted in # 2. Next ESA dose due 7/6. Transfuse PRN. Metabolic bone disease -  Ca in goal.  PTH only 110 03/26/21 on 5 mcg Hectorol, was d/c, recheck pth.  Last PO4 at goal. Renvela 1 G PO TIW when able to eat. Nutrition - Now on dysphagia diet.  DTM2- per primary  Jen Mow, PA-C [facility] 04/23/2021,1:53 PM  LOS: 3 days

## 2021-04-23 NOTE — Progress Notes (Signed)
Occupational Therapy Session Note  Patient Details  Name: Carolyn Russo MRN: [id number] Date of Birth: 10-23-1951  Today's Date: 04/23/2021 OT Individual Time: 0930-1040 OT Individual Time Calculation (min): 70 min    Short Term Goals: Week 1:  OT Short Term Goal 1 (Week 1): Pt transfers to toilet wiht MIN A OT Short Term Goal 2 (Week 1): Pt will stand to groom at sink wiht MIN A OT Short Term Goal 3 (Week 1): Pt will don shirt wiht MIN A OT Short Term Goal 4 (Week 1): Pt will recall hemi dressing strategies wiht min VC  Skilled Therapeutic Interventions/Progress Updates:    Pt resting in w/c upon arrival with therapy schedule in hand (wrong date). Pt presented with correct schedule and reviewed with pt. OT intervention with focus on BADL retraining, sit<>stand, standing balance, attention to Lt, LUE functional use, and safety awareness to increase independence with BADLs. Pt initiating use of LUE in all functional tasks. Pt required min verbal cues to locate clothing on Lt side. Sit<>stand with supervision and min A for standing balance for LB bathing/dressing tasks. Pt required assistance pulling pants over feet after threading and pulling over hips. Pt able to don socks and shoes withoutr assistance. Pt able to fasten shoes with more then a reasonable amount of time. Pt required min verbal cues throughout session for safety awareness. Pt remained in w/c with belt alarm activated and all needs within reach.   Therapy Documentation Precautions:  Precautions Precautions: Fall Restrictions Weight Bearing Restrictions: No Pain: Pain Assessment Pain Scale: 0-10 Pain Score: 8  Pain Type: Acute pain Pain Location: Hip (pt states chronic bursitis pain) Pain Orientation: Right Pain Descriptors / Indicators: Discomfort Pain Intervention(s): ;Repositioned;increased activity (RN in room to give pain medicine prior to OOB)  Therapy/Group: Individual Therapy  Leroy Libman 04/23/2021, 10:44 AM

## 2021-04-23 NOTE — Progress Notes (Signed)
Spoke with neurology services Dr.Xu this morning in regards to plan for stroke prophylaxis.  We will begin low-dose aspirin 81 mg daily Sunday and if no further bleeding episodes then can begin Plavix in 5 to 7 days.

## 2021-04-23 NOTE — Progress Notes (Signed)
Speech Language Pathology Daily Session Note  Patient Details  Name: Carolyn Russo MRN: [id number] Date of Birth: 14-Mar-1951  Today's Date: 04/23/2021 SLP Individual Time: 1130-1200 SLP Individual Time Calculation (min): 30 min  Short Term Goals: Week 1: SLP Short Term Goal 1 (Week 1): Patient will maintain sustained attention for 5 minute increments during a functional task with Max A multimodal cues. SLP Short Term Goal 2 (Week 1): Patient will attend to left field of enviornment during functional tasks with Mod A verbal cues. SLP Short Term Goal 3 (Week 1): Patient will initiate verbal responses in 75% of opportunities with Max A multimodal cues. SLP Short Term Goal 4 (Week 1): Patient will consume current diet with minimal overt s/s of aspiration with Mod verbal cues for use of swallowing compensastory strategies. SLP Short Term Goal 5 (Week 1): Patient will demosntrate efficient mastication and oral clearance with trials of Dys. 3 textures over 2 sessions prior to upgrade with Mod verbal cues. SLP Short Term Goal 6 (Week 1): Patient will utilize speech intelligibility strategies at the sentence level with Mod verbal cues.  Skilled Therapeutic Interventions:   Patient seen for skilled ST session focusing on dysphagia goals. Patient did not seem to recall SLP who had seen her previous date. When SLP entered the room, she was holding room phone and her cell phone and reported she was trying to call her husband. SLP assisted by dialing number which patient dictated but there was no answer. Patient would then periodically pick up a phone and try again. Patient was agreeable to Dys 3 solids trial, choosing frosted flakes cereal with milk. She exhibited mild delay in mastication and clearance of left sided oral residuals. As she was finishing cereal, she then started to cough. She then had another coughing incident moments later. She denies having any h/o dysphagia. SLP to continue to monitor with  plan for objective swallow study if overt s/s aspiration/penetration continue.  Pain Pain Assessment Pain Scale: 0-10 Pain Score: 0-No pain Faces Pain Scale: No hurt  Therapy/Group: Individual Therapy  Carolyn Baller, MA, CCC-SLP Speech Therapy

## 2021-04-23 NOTE — Care Management (Signed)
Inpatient [facility] Individual Statement of Services  Patient Name:  Carolyn Russo  Date:  04/23/2021  Welcome to the [facility].  Our goal is to provide you with an individualized program based on your diagnosis and situation, designed to meet your specific needs.  With this comprehensive rehabilitation program, you will be expected to participate in at least 3 hours of rehabilitation therapies Monday-Friday, with modified therapy programming on the weekends.  Your rehabilitation program will include the following services:  Physical Therapy (PT), Occupational Therapy (OT), Speech Therapy (ST), 24 hour per day rehabilitation nursing, Therapeutic Recreaction (TR), Psychology, Neuropsychology, Care Coordinator, Rehabilitation Medicine, [place], and Other  Weekly team conferences will be held on Tuesdays to discuss your progress.  Your Inpatient Rehabilitation Care Coordinator will talk with you frequently to get your input and to update you on team discussions.  Team conferences with you and your family in attendance may also be held.  Expected length of stay: 12-14 days  Overall anticipated outcome: Supervision  Depending on your progress and recovery, your program may change. Your Inpatient Rehabilitation Care Coordinator will coordinate services and will keep you informed of any changes. Your Inpatient Rehabilitation Care Coordinator's name and contact numbers are listed  below.  The following services may also be recommended but are not provided by the [facility] will be made to provide these services after discharge if needed.  Arrangements include referral to agencies that provide these services.  Your insurance has been verified to be:  Medicare A/B  Your primary doctor  is:  [name]  Pertinent information will be shared with your doctor and your insurance company.  Inpatient Rehabilitation Care Coordinator:  Cathleen Corti [phone] or ([phone]  Information discussed with and copy given to patient by: Rana Snare, 04/23/2021, 2:01 PM

## 2021-04-23 NOTE — Chronic Care Management (AMB) (Signed)
  Care Management   Follow Up Note   04/23/2021 Name: Carolyn Russo MRN: [id number] DOB: 09-11-1951   Referred by: [name], MD Reason for referral : Chronic Care Management   Collaboration with [facility] Liaison who indicates patient has transitioned to inpatient rehab within the hospital.   Follow Up Plan:  The Care Management team will continue to monitor patient chart for progress and plan to engage with patient upon discharge.  Daneen Schick, BSW, CDP Social Worker, Certified Dementia Practitioner [place] [facility] Management [phone]

## 2021-04-23 NOTE — Telephone Encounter (Signed)
Received a call from a Reesa Chew, [place] at [facility] inpatient rehab, who reported patient was admitted to hospital for CVA and GI bleed on 04/16/21. Reports pt is scheduled to resume her ASA on this Sunday and inquired if she should resume or hold medication until after upcoming dialysis access surgery. Advised [facility] doesn't hold ASA for surgery. Voiced understanding. She stated per Dr. Erlinda Hong, procedure is necessary and patient is stable to proceed. Advised will update Dr. Scot Dock on information provided.

## 2021-04-23 NOTE — Progress Notes (Signed)
Physical Therapy Session Note  Patient Details  Name: Carolyn Russo MRN: [id number] Date of Birth: 1950/10/28  Today's Date: 04/23/2021 PT Individual Time: 0806-0917 PT Individual Time Calculation (min): 71 min   Short Term Goals: Week 1:  PT Short Term Goal 1 (Week 1): Pt will complete least restrictive transfers with min A PT Short Term Goal 2 (Week 1): Pt will initiate stair training PT Short Term Goal 3 (Week 1): Pt will ambulate x 50 ft with LRAD and min A PT Short Term Goal 4 (Week 1): Pt will tolerate sitting OOB in chair x 1 hour  Skilled Therapeutic Interventions/Progress Updates:    Pt supine in bed wanting to eat breakfast at start of therapy. Pt consents to participation in PT.  Supine > Sit EOB supervision with HOB elevated to ~25 degrees and verbal/visual cuing for positioning of hips and BLE. Pt maintained seated balance EOB, toe touch down on floor for >20 minutes during ADL training with breakfast and while RN administered morning medications. Therapist stood on left side and placed objects to the left of the tray to facilitate scanning and attention towards left environment with pt requiring 3x of verbal cuing to scan left. Pt demonstrated ability to perform fine motor tasks with opening packets and containers for additions to oatmeal. Able to initiate opening of milk, however, requires some assistance with fully opening milk. Pt requires increased time for ADL tasks secondary to slowed movements.   Transfer bed > wheelchair with RW minA-CGA for steadying. Verbal cuing for posture and RW management to maintain COG within RW BOS during turns.   Gait training with RW CGA ~28ft. Verbal cuing for upright posture and RW management, sam cuing for turns as with transfer into wheelchair. Verbal and tactile cuing for increased step length on LLE with turns to the left. Pt demonstrated decreased cadence, decreased step length L>R, and decreased velocity.   Gait in // bars CGA, down and  back = 1 lap. BUE support on // bars. -Forward walking over shuffle board poles x2 laps with turn towards right to focus on increased stability and LLE step length in turns.  -Lateral walking 1 lap without shuffle poles, 2 laps with poles. Manual facilitation at hips to maintain forward positioning during side steps. Verbal cuing for posture and "keep your toes towards the mirror" to increase hip abductors activation. Verbal and tactile cuing for increased step length on LLE.  -Forward/backwards walking 3 laps, RUE during forwards, BUE during backwards   Pt sitting up in chair with chair alarm on and call bell within reach at the end of session. No further needs expressed.    Therapy Documentation Precautions:  Precautions Precautions: Fall Restrictions Weight Bearing Restrictions: No   Vital Signs: Therapy Vitals Temp: 97.6 F (36.4 C) Temp Source: Oral Pulse Rate: 70 BP: 136/69 Oxygen Therapy SpO2: 100 % Pain: Pain Assessment Pain Scale: 0-10 Pain Score: 8  Pain Type: Acute pain Pain Location: Hip (pt states chronic bursitis pain) Pain Orientation: Right Pain Descriptors / Indicators: Discomfort Pain Intervention(s): Medication (See eMAR);Repositioned;Ambulation/increased activity (RN in room to give pain medicine prior to OOB)   Therapy/Group: Individual Therapy  Lilian Fuhs, SPT 04/23/2021, 9:59 AM

## 2021-04-23 NOTE — Chronic Care Management (AMB) (Addendum)
Opened in error. Patient is currently admitted in ED. Sent scheduling a message to reschedule patient's initial appointment with Orlando Penner to 05-27-2021 at 11:00.  [facility] Pharmacist Assistant [phone]

## 2021-04-23 NOTE — Progress Notes (Addendum)
Patient ID: Carolyn Russo, female   DOB: Jun 27, 1951, 70 y.o.   MRN: [id number]  SW made efforts to meet with pt, but pt off floor for dialysis. SW will continue to make efforts to meet with patient.   SW called [name] [phone]) to discuss statement of service and ELOS 12-14 days with goal being a supervision level of care. SW informed one of co-workers will f/u next week after team conference. He states he Copy. SW to leave another form in room.  SW left sitter list and statement of service in white envelope posted to bathroom door.   Loralee Pacas, MSW, [place] Office: [phone] Cell: [phone] Fax: [phone]

## 2021-04-23 NOTE — Telephone Encounter (Signed)
Per Dr. Scot Dock, They can be on aspirin from my standpoint.

## 2021-04-23 NOTE — Progress Notes (Signed)
PROGRESS NOTE   Subjective/Complaints:  Pt reports improved nasal drainage/eyes.  R hip bursitis is "8/10" but thinks lidoderm somewhat helpful- wants something for pain.  Nauseated and SOB last night- took nebs per pt.  Better this AM.    ROS:  Pt denies SOB, abd pain, CP, N/V/C/D, and vision changes   Objective:   DG Chest 2 View  Result Date: 04/22/2021 CLINICAL DATA:  Leukocytosis. EXAM: CHEST - 2 VIEW COMPARISON:  April 16, 2021. FINDINGS: Stable cardiomediastinal silhouette. Right internal jugular dialysis catheter is unchanged. No pneumothorax is noted. Minimal left basilar subsegmental atelectasis is noted. Right lung is clear. Bony thorax is unremarkable. IMPRESSION: Minimal left basilar subsegmental atelectasis. Electronically Signed   By: Marijo Conception M.D.   On: 04/22/2021 14:04   Recent Labs    04/22/21 0502 04/23/21 0436  WBC 12.5* 11.5*  HGB 9.4* 10.0*  HCT 28.3* 30.0*  PLT 251 268   Recent Labs    04/23/21 0436  NA 134*  K 3.6  CL 96*  CO2 30  GLUCOSE 262*  BUN 14  CREATININE 5.18*  CALCIUM 8.9     Intake/Output Summary (Last 24 hours) at 04/23/2021 1809 Last data filed at 04/23/2021 1647 Gross per 24 hour  Intake 120 ml  Output 2000 ml  Net -1880 ml        Physical Exam: Vital Signs Blood pressure (!) 141/59, pulse 82, temperature 98.7 F (37.1 C), temperature source Oral, resp. rate 16, height 5\' 2"  (1.575 m), weight 80.4 kg, SpO2 100 %.      General: awake, alert, appropriate, sitting up in bed weight off R hip; NAD HENT: conjugate gaze; oropharynx moist- not rubbing eyes/nose not dripping CV: regular rate; no JVD Pulmonary: CTA B/L; no W/R/R- good air movement- sounds great GI: soft, NT, ND, (+)BS Psychiatric: appropriate but frustrated Neurological: alert  Musculoskeletal:        General: No swelling.    Cervical back: Normal range of motion.    Comments: Mild  tenderness left shoulder with ER/IR today  Skin:    General: Skin is warm and dry. Neurological:    Comments: Patient is alert.  No acute distress.  Makes eye contact with examiner.  Provides name and age.  Follows commands.  Fair insight and awareness. Provided biographical information. Normal language, speech slightly slurred. Mild left central 7. LUE 2 to 2+/5 deltoid, 3/5 biceps, triceps and 3+/5 wrist and HI. LLE 3-4/5 prox to distal. RUE and RLE 4-5/5 prox to distal. No sensory deficits. No abnl tone. Assessment/Plan: 1. Functional deficits which require 3+ hours per day of interdisciplinary therapy in a comprehensive inpatient rehab setting. Physiatrist is providing close team supervision and 24 hour management of active medical problems listed below. Physiatrist and rehab team continue to assess barriers to discharge/monitor patient progress toward functional and medical goals  Care Tool:  Bathing    Body parts bathed by patient: Right arm, Left arm, Chest, Abdomen, Front perineal area, Buttocks, Right upper leg, Left upper leg, Right lower leg, Left lower leg, Face   Body parts bathed by helper: Buttocks     Bathing assist Assist Level: Minimal Assistance -  Patient > 75%     Upper Body Dressing/Undressing Upper body dressing   What is the patient wearing?: Pull over shirt    Upper body assist Assist Level: Supervision/Verbal cueing    Lower Body Dressing/Undressing Lower body dressing      What is the patient wearing?: Pants, Incontinence brief     Lower body assist Assist for lower body dressing: Moderate Assistance - Patient 50 - 74%     Toileting Toileting    Toileting assist Assist for toileting: Total Assistance - Patient < 25%     Transfers Chair/bed transfer  Transfers assist     Chair/bed transfer assist level: Moderate Assistance - Patient 50 - 74%     Locomotion Ambulation   Ambulation assist      Assist level: Minimal Assistance -  Patient > 75% Assistive device: Walker-rolling Max distance: 20ft   Walk 10 feet activity   Assist     Assist level: Minimal Assistance - Patient > 75% Assistive device: Walker-rolling   Walk 50 feet activity   Assist Walk 50 feet with 2 turns activity did not occur: Safety/medical concerns         Walk 150 feet activity   Assist Walk 150 feet activity did not occur: Safety/medical concerns         Walk 10 feet on uneven surface  activity   Assist Walk 10 feet on uneven surfaces activity did not occur: Safety/medical concerns         Wheelchair     Assist Will patient use wheelchair at discharge?:  (TBD) Type of Wheelchair: Manual    Wheelchair assist level: Dependent - Patient 0% Max wheelchair distance: 150'    Wheelchair 50 feet with 2 turns activity    Assist        Assist Level: Dependent - Patient 0%   Wheelchair 150 feet activity     Assist      Assist Level: Dependent - Patient 0%   Blood pressure (!) 141/59, pulse 82, temperature 98.7 F (37.1 C), temperature source Oral, resp. rate 16, height 5\' 2"  (1.575 m), weight 80.4 kg, SpO2 100 %.  Medical Problem List and Plan: 1.  Left side weakness secondary to nonhemorrhagic right basal ganglia infarction             -patient may shower             -ELOS/Goals: 10-12 days, mod I goals for PT, OT, SLP  Con't PT, OT and SLP 2.  Antithrombotics: -DVT/anticoagulation:  SCD             -antiplatelet therapy: PLAN IS TO RESUME ASPIRIN ONLY 81 MG DAILY IN 5-7 DAYS IF NO FURTHER BLEEDING  7/1- allowed to restart Plavix- can add Plavix in 5-7 days if not bleeding.  3. Pain Management: Tramadol 50 mg every 12 hours as needed  6/29- c/o R hip bursitis- will try Lidoderm patch R hip 8am to 8pm- see if that helps- might try injection of steroid?  7/1- bursitis her main issue- con't regimen- asking for pain meds 4. Mood: Xanax 0.5 mg 3 times daily as needed.  Provide emotional support              -antipsychotic agents: N/A 5. Neuropsych: This patient is capable of making decisions on her own behalf. 6. Skin/Wound Care: Routine skin checks 7. Fluids/Electrolytes/Nutrition: Routine in and outs with follow-up chemistries 8.  Acute lower GI bleed with blood loss anemia.  Follow-up GI services.  Resume ASA as above. - Continue Protonix 40 mg twice daily  6/30- Hb 9.4 from 9.8- so overall stable- but wil monitor- due to passing BRBPR/and clots-  9.  Chronic diastolic congestive heart failure.  Monitor for any signs of fluid overload.  Continue Lasix 40-80 mg Monday Wednesday Friday             -daily weights  7/1- weight back down from yesterday- con't to monitor 10.  Newly Dx'd End-stage renal disease.  Hemodialysis as per renal services 11.  Hypertension.  Coreg 3.125 mg twice daily  6/29- BP adequate control- con't regimen 12.  Asthma.  Continue inhalers as directed  6/30- was getting Home O2 prior to admission- will likely need to go home on 2L O2- will likely need to order. Was trying to get at home, but hadn't yet.   7/1- will need home O2- con't nebs prn 13.Hyperlipidemia.Crestor 14.Obesity.BMI 34.31.Dietery follow up 15.  Diabetes mellitus.hemoglobin AIc 5.2.  Blood sugar checks discontinued  16. Leukocytosis  6/29- WBC up to 13.7- don't have anything to compare to - will recheck in AM and determine cause if still elevated.   6/30- down to 12.5- no Sx's of infection- will monitor- afebrile of note- will check CXR PA and lateral for this and recheck labs in AM  7/1- only some atalectasis- start flutter valve and con't to monitor 17. Constipation  6/29- LBM 3+ days ago- will order Sorbitol 60cc after therapy- also changed senokot to 1 tab nightly from prn. 6/30- LBM yesterday after sorbitol- con't regimen   18. Fistula placement  6/30- will call [facility] to see if can do while here in rehab- or move til after d/c.   7/1- scheduled for Tuesday 7/5- at 10am  I spent 38  minutes on total care- >50% coordination of care- spoke to PA, as well as husband who is physician 2x about fistula and medical care.   LOS: 3 days A FACE TO FACE EVALUATION WAS PERFORMED  Nakeysha Pasqual 04/23/2021, 6:09 PM

## 2021-04-23 NOTE — Progress Notes (Addendum)
Inpatient Rehabilitation Care Coordinator Assessment and Plan Patient Details  Name: Carolyn Russo MRN: [id number] Date of Birth: [birth year]  Today's Date: 04/23/2021  Hospital Problems: Principal Problem:   Cerebrovascular accident (CVA) of right basal ganglia ([place]) Active Problems:   ESRD on dialysis ([place])   Bandemia  Past Medical History:  Past Medical History:  Diagnosis Date   Allergy    Anterior chest wall pain    Appendicitis 1965   Asthma    Body mass index 37.0-37.9, adult    Breast pain    Cataract    both eyes   Chronic renal disease, stage II    Dehydration 2014   Deviated septum 1971   Diabetes mellitus    Dyspnea 2014   Extrinsic asthma    WITH ASTHMA ATTACK   Fibroid 1980   GERD (gastroesophageal reflux disease)    Heart murmur    Hx gestational diabetes    Hyperlipidemia    Hypertension 2014   Inguinal hernia 1959   Malaise and fatigue 2014   Nephropathy    Non-IgE mediated allergic asthma 2014   Obesity    Pelvic pain    Pregnancy, high-risk 1985   Tonsillitis 1968   Uterine fibroid 1980   Past Surgical History:  Past Surgical History:  Procedure Laterality Date   [place]   COLONOSCOPY     ESOPHAGOGASTRODUODENOSCOPY (EGD) WITH PROPOFOL N/A 04/18/2021   Procedure: ESOPHAGOGASTRODUODENOSCOPY (EGD) WITH PROPOFOL;  Surgeon: Gatha Mayer, MD;  Location: [facility] ENDOSCOPY;  Service: Endoscopy;  Laterality: N/A;   EYE SURGERY     bilateral cataract    FLEXIBLE SIGMOIDOSCOPY N/A 04/18/2021   Procedure: FLEXIBLE SIGMOIDOSCOPY;  Surgeon: Gatha Mayer, MD;  Location: [facility] ENDOSCOPY;  Service: Endoscopy;  Laterality: N/A;   HERNIA REPAIR  1959   IR FLUORO GUIDE CV LINE RIGHT  03/18/2021   IR US GUIDE VASC ACCESS RIGHT  03/18/2021   LEFT HEART CATH AND CORONARY ANGIOGRAPHY N/A 04/04/2017   Procedure: Left Heart Cath and Coronary Angiography;  Surgeon: Belva Crome, MD;  Location: [facility] CV LAB;  Service:  Cardiovascular;  Laterality: N/A;   MYOMECTOMY  1980, 2004, 2007   RHINOPLASTY  1971   ROTATOR CUFF REPAIR  2003   SURGICAL REPAIR OF HEMORRHAGE  2015   TONSILLECTOMY  1968   Social History:  reports that she has never smoked. She has never used smokeless tobacco. She reports that she does not drink alcohol and does not use drugs.  Family / Support Systems Marital Status: Married Patient Roles: Spouse Spouse/Significant Other: Carolyn Russo Children: has children Other Supports: None reported Anticipated Caregiver: husband Carolyn Russo Ability/Limitations of Caregiver: Husbands works full time in Biomedical engineer (eye doctor). Willing to hire private aide services for patient. Caregiver Availability: Intermittent Family Dynamics: Pt lives with husband.  Social History Preferred language: English Religion: Seventh Day Adventist Read: Yes Write: Yes Employment Status: Retired Public relations account executive Issues: No known hx Guardian/Conservator: N/A   Abuse/Neglect Abuse/Neglect Assessment Can Be Completed: Yes Physical Abuse: Denies Verbal Abuse: Denies Sexual Abuse: Denies Exploitation of patient/patient's resources: Denies Self-Neglect: Denies  Emotional Status Recent Psychosocial Issues: Not noted in EMR Psychiatric History: Not noted per EMR Substance Abuse History: Not noted in EMR  Patient / Family Perceptions, Expectations & Goals Pt/Family understanding of illness & functional limitations: Pt husband has a good understanding of patient care needs Premorbid pt/family roles/activities: Independent PTA Anticipated changes in roles/activities/participation: Assistance  with ADLs/IADLs Pt/family expectations/goals: to regain as much independence as possible  Recruitment consultant: None Premorbid Home Care/DME Agencies: None Transportation available at discharge: Husband Resource referrals recommended: Neuropsychology  Discharge Planning Living  Arrangements: Spouse/significant other Support Systems: Spouse/significant other Type of Residence: Private residence Insurance underwriter Resources: Chartered certified accountant Resources: Fish farm manager, [place] Referred: No Living Expenses: Own Money Management: Patient, Spouse Does the patient have any problems obtaining your medications?: No Home Management: Both managed most homecare needs Patient/Family Preliminary Plans: TBD Care Coordinator Barriers to Discharge: Decreased caregiver support, Lack of/limited family support Care Coordinator Anticipated Follow Up Needs: HH/OP Expected length of stay: 12-14 days  Clinical Impression Assessment completed based on EMR and collateral information. SW will continue to make effort to complete assessment with patient. Per EMR, pt was accepted for [facility] services with [facility] on 6/1. SW sent email to [facility] and waiting on confirmation.   *Sw confirmed with Carolyn Russo/[facility] that referral was sent while pt in acute.   Cristabel Bicknell A Kaydon Creedon 04/23/2021, 2:02 PM

## 2021-04-24 DIAGNOSIS — M7061 Trochanteric bursitis, right hip: Secondary | ICD-10-CM

## 2021-04-24 LAB — PARATHYROID HORMONE, INTACT (NO CA): PTH: 128 pg/mL — ABNORMAL HIGH (ref 15–65)

## 2021-04-24 MED ORDER — SEVELAMER CARBONATE 0.8 G PO PACK
0.8000 g | PACK | Freq: Three times a day (TID) | ORAL | Status: DC
  Administered 2021-04-24 – 2021-05-05 (×31): 0.8 g via ORAL
  Filled 2021-04-24 (×38): qty 1

## 2021-04-24 MED ORDER — ONDANSETRON HCL 4 MG PO TABS
4.0000 mg | ORAL_TABLET | Freq: Every evening | ORAL | Status: DC | PRN
  Administered 2021-04-26 – 2021-05-03 (×5): 4 mg via ORAL
  Filled 2021-04-24 (×6): qty 1

## 2021-04-24 NOTE — Progress Notes (Signed)
[facility] Progress Note   Subjective:   Patient seen and examined at bedside.  Reports nausea at night when she lays down to sleep.  Denies vomiting, diarrhea, SOB and CP.  Reports improvement in LE edema.  Tolerated dialysis well yesterday.  No bleeding/melena noted over night.   Objective Vitals:   04/23/21 1938 04/23/21 2001 04/23/21 2004 04/24/21 0414  BP: (!) 136/55   (!) 111/50  Pulse: 82   66  Resp: 18   18  Temp: 97.7 F (36.5 C)   98 F (36.7 C)  TempSrc: Oral   Oral  SpO2: 98% 97% 97% 100%  Weight:    80.2 kg  Height:       Physical Exam General:well appearing female in NAD Heart:RRR, no mrg Lungs:CTAB, nml WOB on RA Abdomen:soft, NTND Extremities: trace LE edema Dialysis Access: [facility]   Filed Weights   04/23/21 1342 04/23/21 1733 04/24/21 0414  Weight: 82.4 kg 80.4 kg 80.2 kg    Intake/Output Summary (Last 24 hours) at 04/24/2021 1109 Last data filed at 04/24/2021 0700 Gross per 24 hour  Intake 60 ml  Output 2000 ml  Net -1940 ml    Additional Objective Labs: Basic Metabolic Panel: Recent Labs  Lab 04/18/21 1006 04/19/21 0413 04/23/21 0436  NA 136 140 134*  K 3.9 3.8 3.6  CL 98 99 96*  CO2 26 28 30   GLUCOSE 101* 87 262*  BUN 27* 30* 14  CREATININE 6.06* 6.39* 5.18*  CALCIUM 8.7* 8.7* 8.9  PHOS  --   --  4.4   Liver Function Tests: Recent Labs  Lab 04/23/21 0436  ALBUMIN 3.2*    CBC: Recent Labs  Lab 04/18/21 1006 04/18/21 1621 04/20/21 1002 04/22/21 0502 04/23/21 0436  WBC 12.9*  --  13.7* 12.5* 11.5*  NEUTROABS 6.2  --   --  7.0 6.1  HGB 9.9*   < > 9.8* 9.4* 10.0*  HCT 29.2*   < > 29.4* 28.3* 30.0*  MCV 87.7  --  89.1 90.7 90.4  PLT 233  --  246 251 268   < > = values in this interval not displayed.    CBG: Recent Labs  Lab 04/20/21 0117 04/20/21 0353 04/20/21 0751 04/20/21 1217 04/21/21 1553  GLUCAP 108* 117* 127* 174* 117*    Studies/Results: DG Chest 2 View  Result Date: 04/22/2021 CLINICAL  DATA:  Leukocytosis. EXAM: CHEST - 2 VIEW COMPARISON:  April 16, 2021. FINDINGS: Stable cardiomediastinal silhouette. Right internal jugular dialysis catheter is unchanged. No pneumothorax is noted. Minimal left basilar subsegmental atelectasis is noted. Right lung is clear. Bony thorax is unremarkable. IMPRESSION: Minimal left basilar subsegmental atelectasis. Electronically Signed   By: Marijo Conception M.D.   On: 04/22/2021 14:04    Medications:   arformoterol  15 mcg Nebulization BID   [START ON 04/25/2021] aspirin EC  81 mg Oral Daily   budesonide (PULMICORT) nebulizer solution  0.5 mg Nebulization BID   carvedilol  3.125 mg Oral BID WC   Chlorhexidine Gluconate Cloth  6 each Topical BID   fluticasone  2 spray Each Nare Daily   furosemide  40 mg Oral Q M,W,F   hydrocortisone  25 mg Rectal BID   ketotifen  2 drop Both Eyes BID   lidocaine  1 patch Transdermal Q24H   pantoprazole  40 mg Oral BID   rosuvastatin  20 mg Oral Daily   senna-docusate  1 tablet Oral QHS   sevelamer carbonate  0.8  g Oral TID WC    Dialysis Orders: [facility]-MWF 4 hrs 180NRe 400/Autoflow 1.5 86.5 kg 2.0K/2.0 Ca TDC -heparin 2000 units IV TIW -Mircera 100 mcg IV q 2 weeks (last dose 04/14/2021 -Hectorol 5 mcg IV TIW   Assessment/Plan: Acute subcortical R CVA-per Primary/Neurology Acute LGIB- Hx hemorrhoidal bleeding in past. new onset 06/25 post clopidogrel load. Hbg drop 9>7.2, improved to 10.0 s/p 2 units PRBCS on 04/18/21. Clopidogrel and ASA on hold. No heparin with HD. Per GI: EGD and flex sig with no etiology and bleeding scan (-). No additional bleeding per patient.  ESRD -  MWF.  DC Heparin d/t new GIB. Continue 4.0 K bath. Last K+ 3.6.   Hypertension/volume  - BP mostly well controlled. Well under EDW by weights here. No evidence of volume overload by exam. UF as tolerated.  Weight now 80-82kg, will need lower dry on d/c. Anemia - Hgb 10.0. As noted in # 2. Next ESA dose due 7/6. Transfuse PRN. Metabolic bone  disease -  Ca in goal.  PTH only 110 03/26/21 on 5 mcg Hectorol, hectorol was d/c.  Rechecked pth 128. Last PO4 at goal. Changed renvela to powder d/t patient inability to swallow pills.  Nutrition - Now on dysphagia diet.  DTM2- per primary  Jen Mow, PA-C [facility] 04/24/2021,11:09 AM  LOS: 4 days

## 2021-04-24 NOTE — Progress Notes (Signed)
Occupational Therapy Session Note  Patient Details  Name: Carolyn Russo MRN: [id number] Date of Birth: 30-Jan-1951  Today's Date: 04/24/2021 OT Individual Time: 3335-4562 OT Individual Time Calculation (min): 58 min    Short Term Goals: Week 2:     Skilled Therapeutic Interventions/Progress Updates: patient participated in skilled OT as follows:  Supine to edge of bed transfer=extra time and S EOB to w/c stand pivot=S Washing UB seated in w/c and dressing of UB= setup and extra time.   Patient with labored breathe and took approximately five 30 second rest breaks LB bathing and dressing= CGA for sit to stand at sink and Min A for motor planning and attention on left side of body  Patient asked times asked this OTA to complete some of her bathing and recurred prompt to complete what she could and educated that rest breaks can be taken and that she was to do as much as she could until she was extremely fatigued or felt she was having a medical incident or had chest pains, nausea, or health problems that needed skilled attention.  At end of session she was left seated in her w/c with alrm belt engaged and call bell and phone within reach.  Continue OT plan of care      Therapy Documentation Precautions:  Precautions Precautions: Fall Restrictions Weight Bearing Restrictions: No   Pain:denied    Therapy/Group: Individual Therapy  Alfredia Ferguson [facility] 04/24/2021, 12:30 PM

## 2021-04-24 NOTE — Progress Notes (Signed)
Speech Language Pathology Daily Session Note  Patient Details  Name: Carolyn Russo MRN: [id number] Date of Birth: 09/23/1951  Today's Date: 04/24/2021 SLP Individual Time: 7096-4383 SLP Individual Time Calculation (min): 45 min  Short Term Goals: Week 1: SLP Short Term Goal 1 (Week 1): Patient will maintain sustained attention for 5 minute increments during a functional task with Max A multimodal cues. SLP Short Term Goal 2 (Week 1): Patient will attend to left field of enviornment during functional tasks with Mod A verbal cues. SLP Short Term Goal 3 (Week 1): Patient will initiate verbal responses in 75% of opportunities with Max A multimodal cues. SLP Short Term Goal 4 (Week 1): Patient will consume current diet with minimal overt s/s of aspiration with Mod verbal cues for use of swallowing compensastory strategies. SLP Short Term Goal 5 (Week 1): Patient will demosntrate efficient mastication and oral clearance with trials of Dys. 3 textures over 2 sessions prior to upgrade with Mod verbal cues. SLP Short Term Goal 6 (Week 1): Patient will utilize speech intelligibility strategies at the sentence level with Mod verbal cues.  Skilled Therapeutic Interventions:  Patient seen for skilled ST session focusing on speech and dysphagia goals. Patient with significant difficulty with maintaining alertness initially, however when her son and husband were both present in room, she was significantly more alert. When alert, patient's speech intelligibility was above 85%. SLP observed patient with trial meal tray of Dys 3 solids, however patient ate mainly mashed potatoes and per her report, seems to eat mainly the softer food items. Mastication was slow, but minimal oral residuals on left side of mouth were observed during PO intake and no coughing or other overt s/s aspiration or penetration. Patient continues to benefit from skilled SLP intervention to maximize speech and swallow function prior to  discharge.  Pain Pain Assessment Pain Scale: 0-10 Pain Score: 0-No pain  Therapy/Group: Individual Therapy  Sonia Baller, MA, CCC-SLP Speech Therapy

## 2021-04-24 NOTE — Progress Notes (Signed)
PROGRESS NOTE   Subjective/Complaints:  Reports SOB last night. Denies any wheezing or cough with it. Says she's not SOB this morning as she continues to eat her breakfast.    ROS: Patient denies fever, rash, sore throat, blurred vision, nausea, vomiting, diarrhea, cough,   chest pain,   headache, or mood change.    Objective:   DG Chest 2 View  Result Date: 04/22/2021 CLINICAL DATA:  Leukocytosis. EXAM: CHEST - 2 VIEW COMPARISON:  April 16, 2021. FINDINGS: Stable cardiomediastinal silhouette. Right internal jugular dialysis catheter is unchanged. No pneumothorax is noted. Minimal left basilar subsegmental atelectasis is noted. Right lung is clear. Bony thorax is unremarkable. IMPRESSION: Minimal left basilar subsegmental atelectasis. Electronically Signed   By: Marijo Conception M.D.   On: 04/22/2021 14:04   Recent Labs    04/22/21 0502 04/23/21 0436  WBC 12.5* 11.5*  HGB 9.4* 10.0*  HCT 28.3* 30.0*  PLT 251 268   Recent Labs    04/23/21 0436  NA 134*  K 3.6  CL 96*  CO2 30  GLUCOSE 262*  BUN 14  CREATININE 5.18*  CALCIUM 8.9     Intake/Output Summary (Last 24 hours) at 04/24/2021 1131 Last data filed at 04/24/2021 0700 Gross per 24 hour  Intake 60 ml  Output 2000 ml  Net -1940 ml        Physical Exam: Vital Signs Blood pressure (!) 111/50, pulse 66, temperature 98 F (36.7 C), temperature source Oral, resp. rate 18, height 5\' 2"  (1.575 m), weight 80.2 kg, SpO2 100 %.      Constitutional: No distress . Vital signs reviewed. HEENT: EOMI, oral membranes moist Neck: supple Cardiovascular: RRR without murmur. No JVD    Respiratory/Chest: CTA Bilaterally without wheezes or rales. Normal effort    GI/Abdomen: BS +, non-tender, non-distended Ext: no clubbing, cyanosis, or edema Psych: flat but cooperative Musculoskeletal:        General: No swelling.    Cervical back: Normal range of motion.    Comments:  Mild tenderness left shoulder with ER/IR today  Skin:    General: Skin is warm and dry. Neurological:    Comments: alert, follows commands.   Fair insight and awareness. Provided biographical information. Normal language, speech slightly slurred. Mild left central 7. LUE 2 to 2+/5 deltoid, 3/5 biceps, triceps and 3+/5 wrist and HI. LLE 3-4/5 prox to distal. RUE and RLE 4-5/5 prox to distal. No sensory deficits noted.   Assessment/Plan: 1. Functional deficits which require 3+ hours per day of interdisciplinary therapy in a comprehensive inpatient rehab setting. Physiatrist is providing close team supervision and 24 hour management of active medical problems listed below. Physiatrist and rehab team continue to assess barriers to discharge/monitor patient progress toward functional and medical goals  Care Tool:  Bathing    Body parts bathed by patient: Right arm, Left arm, Chest, Abdomen, Front perineal area, Buttocks, Right upper leg, Left upper leg, Right lower leg, Left lower leg, Face   Body parts bathed by helper: Buttocks     Bathing assist Assist Level: Minimal Assistance - Patient > 75%     Upper Body Dressing/Undressing Upper body dressing  What is the patient wearing?: Pull over shirt    Upper body assist Assist Level: Supervision/Verbal cueing    Lower Body Dressing/Undressing Lower body dressing      What is the patient wearing?: Pants, Incontinence brief     Lower body assist Assist for lower body dressing: Moderate Assistance - Patient 50 - 74%     Toileting Toileting    Toileting assist Assist for toileting: Total Assistance - Patient < 25%     Transfers Chair/bed transfer  Transfers assist     Chair/bed transfer assist level: Moderate Assistance - Patient 50 - 74%     Locomotion Ambulation   Ambulation assist      Assist level: Minimal Assistance - Patient > 75% Assistive device: Walker-rolling Max distance: 30ft   Walk 10 feet  activity   Assist     Assist level: Minimal Assistance - Patient > 75% Assistive device: Walker-rolling   Walk 50 feet activity   Assist Walk 50 feet with 2 turns activity did not occur: Safety/medical concerns         Walk 150 feet activity   Assist Walk 150 feet activity did not occur: Safety/medical concerns         Walk 10 feet on uneven surface  activity   Assist Walk 10 feet on uneven surfaces activity did not occur: Safety/medical concerns         Wheelchair     Assist Will patient use wheelchair at discharge?:  (TBD) Type of Wheelchair: Manual    Wheelchair assist level: Dependent - Patient 0% Max wheelchair distance: 150'    Wheelchair 50 feet with 2 turns activity    Assist        Assist Level: Dependent - Patient 0%   Wheelchair 150 feet activity     Assist      Assist Level: Dependent - Patient 0%   Blood pressure (!) 111/50, pulse 66, temperature 98 F (36.7 C), temperature source Oral, resp. rate 18, height 5\' 2"  (1.575 m), weight 80.2 kg, SpO2 100 %.  Medical Problem List and Plan: 1.  Left side weakness secondary to nonhemorrhagic right basal ganglia infarction             -patient may shower             -ELOS/Goals: 10-12 days, mod I goals for PT, OT, SLP  -Continue CIR therapies including PT, OT, and SLP  2.  Antithrombotics: -DVT/anticoagulation:  SCD             -antiplatelet therapy:ASA  7/1- allowed to restart Plavix- can add Plavix in 5-7 days if not bleeding.  3. Pain Management: Tramadol 50 mg every 12 hours as needed  7/2- right hip bursitis,  con't regimen-including lidoderm. asking for pain meds   -ice. rom 4. Mood: Xanax 0.5 mg 3 times daily as needed.  Provide emotional support             -antipsychotic agents: N/A 5. Neuropsych: This patient is capable of making decisions on her own behalf. 6. Skin/Wound Care: Routine skin checks 7. Fluids/Electrolytes/Nutrition: Routine in and outs with  follow-up chemistries 8.  Acute lower GI bleed with blood loss anemia.  Follow-up GI services.  Resume ASA as above. - Continue Protonix 40 mg twice daily  7/2 hgb back to 10.0---monitor for gross bleeding 9.  Chronic diastolic congestive heart failure.  Monitor for any signs of fluid overload.  Continue Lasix 40-80 mg Monday Wednesday Friday             -  daily weights  7/1- weight back down from yesterday- con't to monitor 10.  Newly Dx'd End-stage renal disease.  Hemodialysis as per renal services 11.  Hypertension.  Coreg 3.125 mg twice daily  6/29- BP adequate control- con't regimen 12.  Asthma.  Continue inhalers as directed  6/30- was getting Home O2 prior to admission- will likely need to go home on 2L O2- will likely need to order. Was trying to get at home, but hadn't yet.   7/1- will need home O2- con't nebs prn 13.Hyperlipidemia.Crestor 14.Obesity.BMI 34.31.Dietery follow up 15.  Diabetes mellitus.hemoglobin AIc 5.2.  Blood sugar checks discontinued  16. Leukocytosis  6/29- WBC up to 13.7- don't have anything to compare to - will recheck in AM and determine cause if still elevated.   7/1 down to 11.5- no Sx's of infection-     -cxr only with atelectasis- start flutter valve and con't to monitor 17. Constipation  6/29- LBM 3+ days ago- will order Sorbitol 60cc after therapy- also changed senokot to 1 tab nightly from prn. Moving bowelsl- con't regimen   18. Fistula placement  6/30- will call Vascular to see if can do while here in rehab- or move til after d/c.   7/1- scheduled for Tuesday 7/5- at 10am    LOS: 4 days A FACE TO FACE EVALUATION WAS PERFORMED  Meredith Staggers 04/24/2021, 11:31 AM

## 2021-04-24 NOTE — Progress Notes (Addendum)
Physical Therapy Session Note  Patient Details  Name: Carolyn Russo MRN: [id number] Date of Birth: 19-Jun-1951  Today's Date: 04/24/2021 PT Individual Time: 1118-1200 and 1445-1530 PT Individual Time Calculation (min): 42 min and 45 min   Short Term Goals: Week 1:  PT Short Term Goal 1 (Week 1): Pt will complete least restrictive transfers with min A PT Short Term Goal 2 (Week 1): Pt will initiate stair training PT Short Term Goal 3 (Week 1): Pt will ambulate x 50 ft with LRAD and min A PT Short Term Goal 4 (Week 1): Pt will tolerate sitting OOB in chair x 1 hour  Skilled Therapeutic Interventions/Progress Updates:  Session  1:  Patient seated upright in w/c on entrance to room and asleep. Husband and son present. Husband wakes pt and pt requires time to fully wake. Agreeable to PT session. Patient related back/ bursitis pain during session while gait training. Pt on 2L O2 on entrance. Son relates that pt placed on O2 overnight d/t heaviness on pt's chest and related difficulty breathing. Pt's SpO2 checked and 100% on 2L. O2 removed and levels checked 21min later on RA with SpO2 reading 99%. O2 left off throughout remainder of session and SpO2 WFL throughout.  Gait Training:  Patient ambulated 48' x1/ 35' x1/ 59' x1 using RW with CGA. Demonstrated high knee stepping initially for foot clearance. Provided vc/ tc for heel-to-toe step progression and by third bout, pt demonstrates good quality heel-to-toe progression. Husband +2 for w/c follow throughout.  Wheelchair Mobility:  Patient propelled wheelchair 55 feet with CGA and intermittent MinA for steering using BUE and BLE. Provided vc for technique with pt demonstrating good reciprocal BLE stepping and good heel strike with hamstring pull on LLE. Pt performs 90 degree turn to R in order to turn into room with minimal cueing for technique and sup/ CGA.  Patient seated upright in w/c at end of session with brakes locked, belt alarm set, and all  needs within reach. Tray table in front of pt for lunch setup with SLP returning for lunch observation.  Session 2:  Patient seated upright in w/c on entrance to room. Patient initially asleep and requiring time and effort to wake. Initially confused on waking. Time given to orient self to time and location. Pt initially asking for bags in windows and putting one in other in order to gather items that son will take with him when he comes. Pt becomes more awake and alert and agreeable to PT session. SpO2 taken at start of session and pt at 100% on RA. Session performed on RA with SpO2 WFL throughout. No pain complaint during session.  Therapeutic Activity: Transfers: Patient performed STS and SPVT transfers slowly and with supervision to RW. Provided verbal cues for reaching back to seat armrests when ready to sit.  Gait Training:  Patient ambulated 47' x1/ 15' x1 using RW with CGA and close w/c follow for fatigue. Demonstrated focused effort on heel-to-toe advancement of BLE. Provided vc/ tc for continued effort throughout.  Neuromuscular Re-ed: NMR facilitated during session with focus on standing balance. Pt guided in reach overhead to pt's tolerance. Task requiring reach to target with UUE support and no UE support. Pt only able to maintain standing for up to 2 min but with good balance noted. NMR performed for improvements in motor control and coordination, balance, sequencing, judgement, and self confidence/ efficacy in performing all aspects of mobility at highest level of independence.   Pt guided in continuous  reciprocation of BUE and BLE using NuStep L2 x 37min, L3 x44min, and L2 x 43min with focus on LLE extensor push and L hand grasp on handle throughout.  Patient seated upright  in w/c at end of session with brakes locked, belt alarm set, and all needs within reach st bedside or on tray table in front of pt.     Therapy Documentation Precautions:  Precautions Precautions:  Fall Restrictions Weight Bearing Restrictions: No  Therapy/Group: Individual Therapy  Alger Simons PT, DPT 04/24/2021, 12:31 PM

## 2021-04-24 NOTE — Plan of Care (Signed)
  Problem: Consults Goal: RH STROKE PATIENT EDUCATION Description: See Patient Education module for education specifics  Outcome: Progressing Goal: Nutrition Consult-if indicated Outcome: Progressing   Problem: RH BOWEL ELIMINATION Goal: RH STG MANAGE BOWEL WITH ASSISTANCE Description: STG Manage Bowel with Mod I Assistance. Outcome: Progressing Goal: RH STG MANAGE BOWEL W/MEDICATION W/ASSISTANCE Description: STG Manage Bowel with Medication with Mod I Assistance. Outcome: Progressing   Problem: RH SKIN INTEGRITY Goal: RH STG MAINTAIN SKIN INTEGRITY WITH ASSISTANCE Description: STG Maintain Skin Integrity With Mod I Assistance. Outcome: Progressing Goal: RH STG ABLE TO PERFORM INCISION/WOUND CARE W/ASSISTANCE Description: STG Able To Perform Incision/Wound Care With Mod I Assistance. Outcome: Progressing   Problem: RH SAFETY Goal: RH STG ADHERE TO SAFETY PRECAUTIONS W/ASSISTANCE/DEVICE Description: STG Adhere to Safety Precautions With Cues and Reminders. Outcome: Progressing   Problem: RH PAIN MANAGEMENT Goal: RH STG PAIN MANAGED AT OR BELOW PT'S PAIN GOAL Description: < 3 on a 0-10 pain scale. Outcome: Progressing   Problem: RH KNOWLEDGE DEFICIT Goal: RH STG INCREASE KNOWLEDGE OF HYPERTENSION Description: Patient will be able to demonstrate knowledge of HTN medication management, dietary management, BP parameters with educational materials and handouts provided by staff independently at discharge. Outcome: Progressing Goal: RH STG INCREASE KNOWLEGDE OF HYPERLIPIDEMIA Description: Patient will be able to demonstrate knowledge of HLD medication management, dietary management, lab values with educational materials and handouts provided by staff independently at discharge. Outcome: Progressing Goal: RH STG INCREASE KNOWLEDGE OF STROKE PROPHYLAXIS Description: Patient will be able to demonstrate knowledge of medications used to prevent future strokes with educational materials  and handouts provided by staff independently at discharge. Outcome: Progressing

## 2021-04-25 NOTE — Progress Notes (Signed)
Occupational Therapy Session Note  Patient Details  Name: Carolyn Russo MRN: [id number] Date of Birth: 1951/03/20  Today's Date: 04/25/2021 OT Individual Time: 7121-9758 OT Individual Time Calculation (min): 41 min    Short Term Goals: Week 1:  OT Short Term Goal 1 (Week 1): Pt transfers to toilet wiht MIN A OT Short Term Goal 2 (Week 1): Pt will stand to groom at sink wiht MIN A OT Short Term Goal 3 (Week 1): Pt will don shirt wiht MIN A OT Short Term Goal 4 (Week 1): Pt will recall hemi dressing strategies wiht min VC  Skilled Therapeutic Interventions/Progress Updates:    OT intervention with focus on BADL training, sit<>stand, standing balance, activity tolerance, attention to Lt, attention to task, and safety awareness to increase independence with BADLs. Bathing and dressing with sit<>stand from w/c at sink. Sit<>stand with supervisoin. CGA for standing balance. Pt required assistance pulling pants over hips and fastening shoes. Pt requires more then a reasonable amount of time to complete tasks with multiple rest breaks 2/2 SOB. O2 sats>95% on RA. See Care Tool for detail assist levels.   Therapy Documentation Precautions:  Precautions Precautions: Fall Restrictions Weight Bearing Restrictions: No Pain:  Pt denies pain this morning   Therapy/Group: Individual Therapy  Leroy Libman 04/25/2021, 8:57 AM

## 2021-04-25 NOTE — Progress Notes (Signed)
Occupational Therapy Session Note  Patient Details  Name: Carolyn Russo MRN: [id number] Date of Birth: 1951-09-05  Today's Date: 04/25/2021 OT Individual Time: 0700-0755 OT Individual Time Calculation (min): 55 min    Short Term Goals: Week 1:  OT Short Term Goal 1 (Week 1): Pt transfers to toilet wiht MIN A OT Short Term Goal 2 (Week 1): Pt will stand to groom at sink wiht MIN A OT Short Term Goal 3 (Week 1): Pt will don shirt wiht MIN A OT Short Term Goal 4 (Week 1): Pt will recall hemi dressing strategies wiht min VC  Skilled Therapeutic Interventions/Progress Updates:    Pt resting in bed upon arrival. Nasal canula placed in nostrils but O2 turned off at wall.  O2 sats 99% on RA. Pt had not had breakfast yet. Supine>sit EOB with supervision and SPT with RW to w/c with CGA. OT intervention with focus on attention to task, self feeding using BUE to open containers, and attention to Lt. Pt very deliberate with all actions during task. Pt required assistance opening milk container but able to open all other containers without assitance. Pt would frequently "forget" that she was holding items in her Lt hand. Pt required approx 45 mins to complete eating breakfast including setup. TV tuned off during session and conversation at a minimum. Pt remained in w/c with all needs within reach and belt alarm activated.  Therapy Documentation Precautions:  Precautions Precautions: Fall Restrictions Weight Bearing Restrictions: No  Pain:  Pt denies pain this morning   Therapy/Group: Individual Therapy  Leroy Libman 04/25/2021, 7:42 AM

## 2021-04-25 NOTE — Progress Notes (Signed)
Physical Therapy Session Note  Patient Details  Name: Carolyn Russo MRN: [id number] Date of Birth: [birth year]  Today's Date: 04/25/2021 PT Individual Time: 1011-1109; 1615-1700 PT Individual Time Calculation (min): 58 min and 45 min  Short Term Goals: Week 1:  PT Short Term Goal 1 (Week 1): Pt will complete least restrictive transfers with min A PT Short Term Goal 2 (Week 1): Pt will initiate stair training PT Short Term Goal 3 (Week 1): Pt will ambulate x 50 ft with LRAD and min A PT Short Term Goal 4 (Week 1): Pt will tolerate sitting OOB in chair x 1 hour  Skilled Therapeutic Interventions/Progress Updates:    Session 1: Pt received seated in w/c in asleep, arousable and agreeable to PT session. Pt reports feeling very fatigued this AM, did not sleep well overnight. Pt reports pain under her L breast described as an "achy" pain, not rated and declines intervention. Pt reports pain from R hip bursitis in standing and with mobility that improves at rest, pain patch in place to R hip. Sit to stand with min to mod A to RW throughout session, cues for safe UE placement during transfer. Ambulation 2 x 15 ft with RW and min A for balance before onset of fatigue and R hip pain. Pt exhibits decreased tolerance for gait training this date. Standing tolerance x 10 min while performing UE task sorting cards with focus on use of LUE>RUE and scanning to the L. Pt reports urge to urinate. Stand pivot transfer w/c to/from toilet with min A and use of grab bar. Pt is min A for balance while performing clothing management. Pt is setup A for pericare. Standing alt L/R 6" step-ups with BUE support and min A for balance, x 10 reps each. Pt reports feeling fatigued following step ups and requests to return to bed. Squat pivot transfer w/c to bed with mod A. Sit to supine Supervision. Pt left seated in bed with needs in reach, bed alarm in place.  Session 2: Pt received seated in w/c in room with husband present in  room, agreeable to PT session. No complaints of pain. Sit to stand with min A to RW throughout session. Ambulation 4 x 32 ft with RW and min A for balance, steppage gait with LLE noted at times. Standing alt L/R 4" step-taps with RW and CGA for balance x 10 reps. Sit to stand 2 x 5 reps with no UE support and min A with focus on LE strengthening. Ambulation 2 x 15 ft with RW and min A end of session, increase in fatigue and request to sit after shorter distances. Pt agreeable to stay seated up in w/c for dinner, needs in reach, quick release belt and chair alarm in place.  Therapy Documentation Precautions:  Precautions Precautions: Fall Restrictions Weight Bearing Restrictions: No   Therapy/Group: Individual Therapy   Excell Seltzer, PT, DPT, CSRS  04/25/2021, 11:51 AM

## 2021-04-25 NOTE — Progress Notes (Addendum)
Carolyn Russo Progress Note   Subjective:   Patient seen and examined at bedside.  Reports orthopnea, OT checked O2 sat this AM when she reported symptoms and 99%.  Denies current SOB, CP, edema, and n/v/d.    Objective Vitals:   04/24/21 2025 04/25/21 0352 04/25/21 0500 04/25/21 0853  BP:  (!) 156/72    Pulse:  76  74  Resp:  18  20  Temp:  97.9 F (36.6 C)    TempSrc:  Oral    SpO2: 99% 100%  99%  Weight:   81.4 kg   Height:       Physical Exam General:well appearing female in NAD Heart:RRR, no mrg Lungs:CTAB, no crackles, rales or rhonchi, nml WOB on RA Abdomen:soft, NTND Extremities:no LE edema Dialysis Access: [facility]   Filed Weights   04/23/21 1733 04/24/21 0414 04/25/21 0500  Weight: 80.4 kg 80.2 kg 81.4 kg    Intake/Output Summary (Last 24 hours) at 04/25/2021 1022 Last data filed at 04/25/2021 0900 Gross per 24 hour  Intake 480 ml  Output --  Net 480 ml    Additional Objective Labs: Basic Metabolic Panel: Recent Labs  Lab 04/19/21 0413 04/23/21 0436  NA 140 134*  K 3.8 3.6  CL 99 96*  CO2 28 30  GLUCOSE 87 262*  BUN 30* 14  CREATININE 6.39* 5.18*  CALCIUM 8.7* 8.9  PHOS  --  4.4   Liver Function Tests: Recent Labs  Lab 04/23/21 0436  ALBUMIN 3.2*    CBC: Recent Labs  Lab 04/20/21 1002 04/22/21 0502 04/23/21 0436  WBC 13.7* 12.5* 11.5*  NEUTROABS  --  7.0 6.1  HGB 9.8* 9.4* 10.0*  HCT 29.4* 28.3* 30.0*  MCV 89.1 90.7 90.4  PLT 246 251 268    CBG: Recent Labs  Lab 04/20/21 0117 04/20/21 0353 04/20/21 0751 04/20/21 1217 04/21/21 1553  GLUCAP 108* 117* 127* 174* 117*    Medications:   arformoterol  15 mcg Nebulization BID   aspirin EC  81 mg Oral Daily   budesonide (PULMICORT) nebulizer solution  0.5 mg Nebulization BID   carvedilol  3.125 mg Oral BID WC   Chlorhexidine Gluconate Cloth  6 each Topical BID   fluticasone  2 spray Each Nare Daily   furosemide  40 mg Oral Q M,W,F   hydrocortisone  25 mg Rectal  BID   ketotifen  2 drop Both Eyes BID   lidocaine  1 patch Transdermal Q24H   pantoprazole  40 mg Oral BID   rosuvastatin  20 mg Oral Daily   senna-docusate  1 tablet Oral QHS   sevelamer carbonate  0.8 g Oral TID WC    Dialysis Orders: [facility]-MWF 4 hrs 180NRe 400/Autoflow 1.5 86.5 kg 2.0K/2.0 Ca TDC -heparin 2000 units IV TIW -Mircera 100 mcg IV q 2 weeks (last dose 04/14/2021 -Hectorol 5 mcg IV TIW   Assessment/Plan: Acute subcortical R CVA-per Primary/Neurology.  In CIR. Acute LGIB- Hx hemorrhoidal bleeding in past. new onset 06/25 post clopidogrel load. Hbg drop 9>7.2, improved to 10.0 s/p 2 units PRBCS on 04/18/21. Clopidogrel and ASA on hold. No heparin with HD. Per GI: EGD and flex sig with no etiology and bleeding scan (-). No additional bleeding per patient.  ESRD -  MWF.  DC Heparin d/t new GIB. Continue 4.0 K bath. Last K+ 3.6.   Hypertension/volume  - BP mostly well controlled. Well under EDW by weights here. No evidence of volume overload by exam, lung clear. UF  as tolerated.  Weight now 80-82kg, will need lower dry on d/c.   Anemia - Hgb 10.0. As noted in # 2. Next ESA dose due 7/6. Transfuse PRN. Metabolic bone disease -  Ca in goal.  PTH only 110 03/26/21 on 5 mcg Hectorol, hectorol was held at that time.  Rechecked pth 128. No need to restart VDRA. Last PO4 at goal. Changed renvela to powder d/t patient inability to swallow pills.  Nutrition - Now on dysphagia diet.  DTM2- per primary  Carolyn Mow, PA-C [facility] 04/25/2021,10:22 AM  LOS: 5 days

## 2021-04-25 NOTE — Plan of Care (Signed)

## 2021-04-25 NOTE — Progress Notes (Signed)
PROGRESS NOTE   Subjective/Complaints:  Pt stated that she was sob this morning. Therapy at bedside. Wearing [place] but no oxygen flowing in.   ROS: Patient denies fever, rash, sore throat, blurred vision, nausea, vomiting, diarrhea, cough,   chest pain, joint or back pain, headache, or mood change.    Objective:   No results found. Recent Labs    04/23/21 0436  WBC 11.5*  HGB 10.0*  HCT 30.0*  PLT 268   Recent Labs    04/23/21 0436  NA 134*  K 3.6  CL 96*  CO2 30  GLUCOSE 262*  BUN 14  CREATININE 5.18*  CALCIUM 8.9     Intake/Output Summary (Last 24 hours) at 04/25/2021 6803 Last data filed at 04/25/2021 0900 Gross per 24 hour  Intake 480 ml  Output --  Net 480 ml        Physical Exam: Vital Signs Blood pressure (!) 156/72, pulse 74, temperature 97.9 F (36.6 C), temperature source Oral, resp. rate 20, height 5\' 2"  (1.575 m), weight 81.4 kg, SpO2 99 %.      Constitutional: No distress . Vital signs reviewed. HEENT: EOMI, oral membranes moist Neck: supple Cardiovascular: RRR without murmur. No JVD    Respiratory/Chest: CTA Bilaterally without wheezes or rales. Normal effort    GI/Abdomen: BS +, non-tender, non-distended Ext: no clubbing, cyanosis, or edema Psych: pleasant and cooperative  Musculoskeletal:        General: No swelling.    Cervical back: Normal range of motion.    Comments: Mild tenderness left shoulder with ER/IR today  Skin:    General: Skin is warm and dry. Neurological:    Comments: alert, follows commands.   Fair insight and awareness. Provided biographical information. Normal language, speech slightly slurred. Mild left central 7. LUE 2 to 2+/5 deltoid, 3/5 biceps, triceps and 3+/5 wrist and HI. LLE 3-4/5 prox to distal. RUE and RLE 4-5/5 prox to distal. No sensory deficits noted--motor/sensory exam stable today.   Assessment/Plan: 1. Functional deficits which require 3+ hours  per day of interdisciplinary therapy in a comprehensive inpatient rehab setting. Physiatrist is providing close team supervision and 24 hour management of active medical problems listed below. Physiatrist and rehab team continue to assess barriers to discharge/monitor patient progress toward functional and medical goals  Care Tool:  Bathing    Body parts bathed by patient: Right arm, Left arm, Chest, Abdomen, Front perineal area, Buttocks, Right upper leg, Left upper leg, Right lower leg, Left lower leg, Face   Body parts bathed by helper: Buttocks     Bathing assist Assist Level: Minimal Assistance - Patient > 75%     Upper Body Dressing/Undressing Upper body dressing   What is the patient wearing?: Pull over shirt    Upper body assist Assist Level: Minimal Assistance - Patient > 75%    Lower Body Dressing/Undressing Lower body dressing      What is the patient wearing?: Pants, Incontinence brief     Lower body assist Assist for lower body dressing: Minimal Assistance - Patient > 75%     Toileting Toileting    Toileting assist Assist for toileting: Total Assistance - Patient <  25%     Transfers Chair/bed transfer  Transfers assist     Chair/bed transfer assist level: Moderate Assistance - Patient 50 - 74%     Locomotion Ambulation   Ambulation assist      Assist level: Minimal Assistance - Patient > 75% Assistive device: Walker-rolling Max distance: 73ft   Walk 10 feet activity   Assist     Assist level: Minimal Assistance - Patient > 75% Assistive device: Walker-rolling   Walk 50 feet activity   Assist Walk 50 feet with 2 turns activity did not occur: Safety/medical concerns         Walk 150 feet activity   Assist Walk 150 feet activity did not occur: Safety/medical concerns         Walk 10 feet on uneven surface  activity   Assist Walk 10 feet on uneven surfaces activity did not occur: Safety/medical concerns          Wheelchair     Assist Will patient use wheelchair at discharge?:  (TBD) Type of Wheelchair: Manual    Wheelchair assist level: Dependent - Patient 0% Max wheelchair distance: 150'    Wheelchair 50 feet with 2 turns activity    Assist        Assist Level: Dependent - Patient 0%   Wheelchair 150 feet activity     Assist      Assist Level: Dependent - Patient 0%   Blood pressure (!) 156/72, pulse 74, temperature 97.9 F (36.6 C), temperature source Oral, resp. rate 20, height 5\' 2"  (1.575 m), weight 81.4 kg, SpO2 99 %.  Medical Problem List and Plan: 1.  Left side weakness secondary to nonhemorrhagic right basal ganglia infarction             -patient may shower             -ELOS/Goals: 10-12 days, mod I goals for PT, OT, SLP  -Continue CIR therapies including PT, OT, and SLP   2.  Antithrombotics: -DVT/anticoagulation:  SCD             -antiplatelet therapy:ASA  7/1- allowed to restart Plavix- can add Plavix in 5-7 days if not bleeding.  3. Pain Management: Tramadol 50 mg every 12 hours as needed  7/2- right hip bursitis,  con't regimen-including lidoderm. asking for pain meds   -ice. rom 4. Mood: Xanax 0.5 mg 3 times daily as needed.  Provide emotional support             -antipsychotic agents: N/A 5. Neuropsych: This patient is capable of making decisions on her own behalf. 6. Skin/Wound Care: Routine skin checks 7. Fluids/Electrolytes/Nutrition: Routine in and outs with follow-up chemistries 8.  Acute lower GI bleed with blood loss anemia.  Follow-up GI services.  Resume ASA as above. - Continue Protonix 40 mg twice daily  7/2 hgb back to 10.0---monitor for gross bleeding 9.  Chronic diastolic congestive heart failure.  Monitor for any signs of fluid overload.  Continue Lasix 40-80 mg Monday Wednesday Friday             -daily weights  7/1- weight back down from yesterday- con't to monitor 10.  Newly Dx'd End-stage renal disease.  Hemodialysis as per  renal services 11.  Hypertension.  Coreg 3.125 mg twice daily  6/29- BP adequate control- con't regimen 12.  Asthma.  Continue inhalers as directed  - was getting Home O2 prior to admission- will likely need to go home on 2L O2-  Was trying to get at home, but hadn't yet.   7/1- con't nebs prn, home O2 ?PRN only  7/3 lungs very clear today, sats 99% on RA. Some of SOB may be behaviorally related. Reassured pt today that lungs sounded fine.   -encouraged FV/IS, OOB 13.Hyperlipidemia.Crestor 14.Obesity.BMI 34.31.Dietery follow up 15.  Diabetes mellitus.hemoglobin AIc 5.2.  Blood sugar checks discontinued  16. Leukocytosis  6/29- WBC up to 13.7- don't have anything to compare to - will recheck in AM and determine cause if still elevated.   7/1 down to 11.5- no Sx's of infection-     -cxr only with atelectasis-   flutter valve and con't to monitor 17. Constipation  6/29- LBM 3+ days ago- will order Sorbitol 60cc after therapy- also changed senokot to 1 tab nightly from prn. Moving bowels   18. Fistula placement  6/30- will call Vascular to see if can do while here in rehab- or move til after d/c.   7/1- scheduled for Tuesday 7/5- at 10am    LOS: 5 days A FACE TO [place] 04/25/2021, 9:21 AM

## 2021-04-26 ENCOUNTER — Inpatient Hospital Stay (HOSPITAL_COMMUNITY): Payer: Medicare Other

## 2021-04-26 IMAGING — DX DG ABDOMEN 1V
1 series · 1 of 1 positions shown · non-contrast
Comparison: CT of the abdomen and pelvis on [DATE]

CLINICAL DATA: LEFT-sided abdominal pain since yesterday.
Constipation.

EXAM:
ABDOMEN - 1 VIEW

[t abdomen supine]
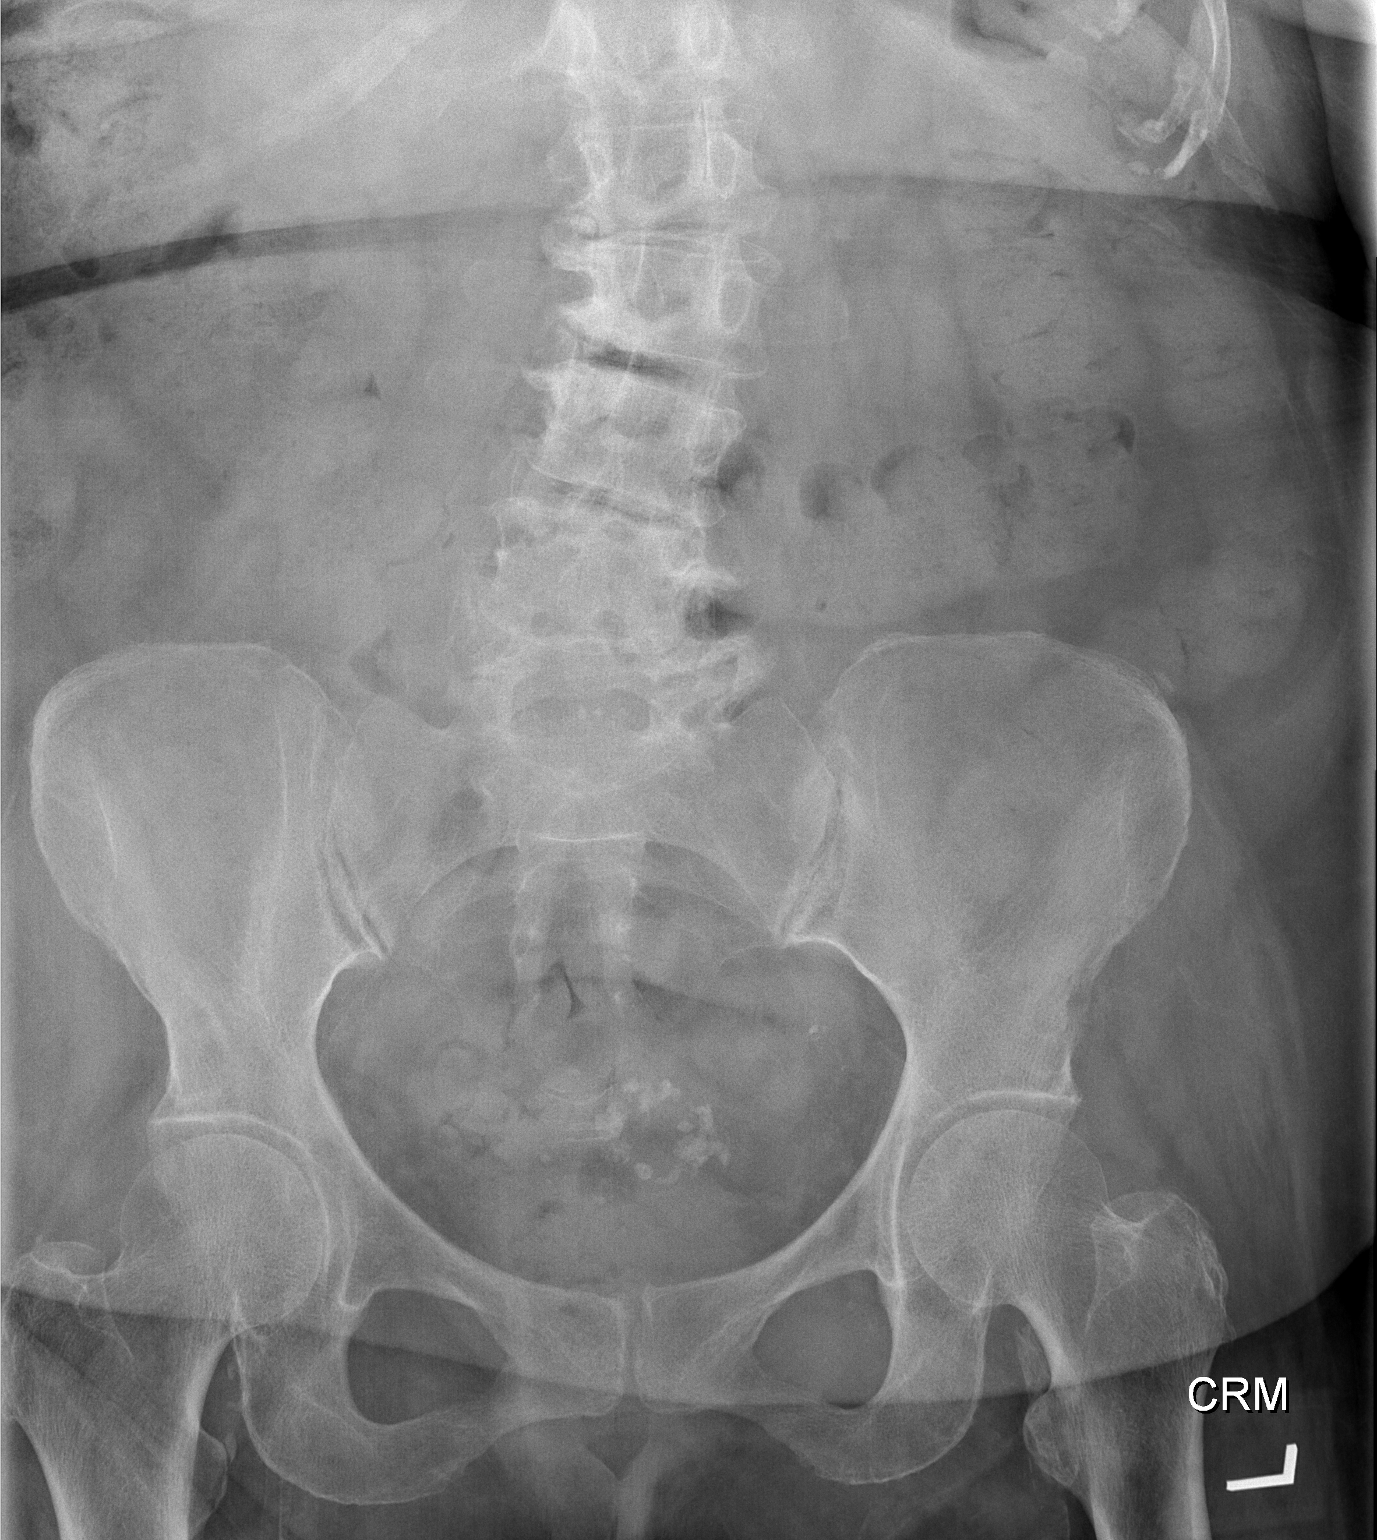

[1 of 1 positions shown; findings below may reference images not displayed]

FINDINGS: Bowel gas pattern is nonobstructive. Moderate stool burden noted
within nondilated loops of colon. Scoliosis is associated with
degenerative changes in the lumbar spine.
IMPRESSION: No evidence for acute  abnormality.  Moderate stool burden.

## 2021-04-26 MED ORDER — SENNOSIDES-DOCUSATE SODIUM 8.6-50 MG PO TABS
1.0000 | ORAL_TABLET | Freq: Two times a day (BID) | ORAL | Status: DC
  Administered 2021-04-26 – 2021-04-27 (×3): 1 via ORAL
  Filled 2021-04-26 (×3): qty 1

## 2021-04-26 MED ORDER — MAGNESIUM CITRATE PO SOLN: 1.0000 | Freq: Once | ORAL | Status: DC

## 2021-04-26 MED ORDER — CARVEDILOL 6.25 MG PO TABS
6.2500 mg | ORAL_TABLET | Freq: Two times a day (BID) | ORAL | Status: DC
  Administered 2021-04-26: 6.25 mg via ORAL
  Filled 2021-04-26: qty 1

## 2021-04-26 NOTE — Progress Notes (Signed)
[facility] Progress Note   Subjective:   Patient describes some left upper quadrant abdominal pain/left lower chest area that is not painful with palpation but worse with inspiration. Sharp pain. Otherwise feels well. Working with therapy.  Objective Vitals:   04/25/21 1912 04/25/21 2038 04/26/21 0436 04/26/21 0440  BP: (!) 160/115  (!) 166/77   Pulse: 72  75   Resp: 18  18   Temp: 98.4 F (36.9 C)  97.8 F (36.6 C)   TempSrc: Oral     SpO2: 98% 98% 98%   Weight:    82.7 kg  Height:       Physical Exam General:well appearing female in NAD Heart:normal rate, no murmurs Lungs:bilateral chest rise with no iwob Abdomen:soft, NTND Extremities:no LE edema [facility]ialysis [facility]: [facility]   Filed Weights   04/24/21 0414 04/25/21 0500 04/26/21 0440  Weight: 80.2 kg 81.4 kg 82.7 kg    Intake/Output Summary (Last 24 hours) at 04/26/2021 0928 Last data filed at 04/26/2021 0700 Gross per 24 hour  Intake 270 ml  Output 200 ml  Net 70 ml    Additional Objective Labs: Basic Metabolic Panel: Recent Labs  Lab 04/23/21 0436  NA 134*  K 3.6  CL 96*  CO2 30  GLUCOSE 262*  BUN 14  CREATININE 5.18*  CALCIUM 8.9  PHOS 4.4   Liver Function Tests: Recent Labs  Lab 04/23/21 0436  ALBUMIN 3.2*    CBC: Recent Labs  Lab 04/20/21 1002 04/22/21 0502 04/23/21 0436  WBC 13.7* 12.5* 11.5*  NEUTROABS  --  7.0 6.1  HGB 9.8* 9.4* 10.0*  HCT 29.4* 28.3* 30.0*  MCV 89.1 90.7 90.4  PLT 246 251 268    CBG: Recent Labs  Lab 04/20/21 0117 04/20/21 0353 04/20/21 0751 04/20/21 1217 04/21/21 1553  GLUCAP 108* 117* 127* 174* 117*    Medications:   arformoterol  15 mcg Nebulization BID   aspirin EC  81 mg Oral Daily   budesonide (PULMICORT) nebulizer solution  0.5 mg Nebulization BID   carvedilol  3.125 mg Oral BID WC   Chlorhexidine Gluconate Cloth  6 each Topical BID   fluticasone  2 spray Each Nare Daily   furosemide  40 mg Oral Q M,W,F   hydrocortisone  25 mg  Rectal BID   ketotifen  2 drop Both Eyes BID   lidocaine  1 patch Transdermal Q24H   pantoprazole  40 mg Oral BID   rosuvastatin  20 mg Oral Daily   senna-docusate  1 tablet Oral QHS   sevelamer carbonate  0.8 g Oral TID WC    Dialysis Orders: [facility]-MWF 4 hrs 180NRe 400/Autoflow 1.5 86.5 kg 2.0K/2.0 Ca TDC -heparin 2000 units IV TIW -Mircera 100 mcg IV q 2 weeks (last dose 04/14/2021 -Hectorol 5 mcg IV TIW   Assessment/Plan: Acute subcortical R CVA-per Primary/Neurology.  In CIR. Acute LGIB- Hx hemorrhoidal bleeding in past. new onset 06/25 post clopidogrel load. Hbg drop 9>7.2, improved to 10.0 s/p 2 units PRBCS on 04/18/21. Clopidogrel and ASA on hold. No heparin with HD. Per GI: EGD and flex sig with no etiology and bleeding scan (-). No additional bleeding per patient.  ESRD -  MWF.  Off heparin d/t new GIB. Continue 4.0 K bath.   Hypertension/volume  -BP higher today.  Under dry weight.  Weight now 80-82kg, will need lower dry on d/c.   Anemia - Hgb 10.0. As noted in # 2. Next ESA dose due 7/6. Transfuse PRN. Metabolic bone  disease -  Ca in goal.  PTH only 110 03/26/21 on 5 mcg Hectorol, hectorol was held at that time.  Rechecked pth 128. No need to restart VDRA. Last PO4 at goal. Changed renvela to powder d/t patient inability to swallow pills.  Nutrition - Now on dysphagia diet.  DTM2- per primary Pleuritic abdominal/chest pain: Unclear cause.  Intermittent aspiration may be a cause or previous GI bleed.  Continue to monitor and manage symptoms  [facility] 04/26/2021,9:28 AM  LOS: 6 days

## 2021-04-26 NOTE — Progress Notes (Signed)
Physical Therapy Session Note  Patient Details  Name: Carolyn Russo MRN: [id number] Date of Birth: April 02, 1951  Today's Date: 04/26/2021 PT Individual Time: 1100-1200 PT Individual Time Calculation (min): 60 min   Short Term Goals: Week 1:  PT Short Term Goal 1 (Week 1): Pt will complete least restrictive transfers with min A PT Short Term Goal 2 (Week 1): Pt will initiate stair training PT Short Term Goal 3 (Week 1): Pt will ambulate x 50 ft with LRAD and min A PT Short Term Goal 4 (Week 1): Pt will tolerate sitting OOB in chair x 1 hour  Skilled Therapeutic Interventions/Progress Updates:    Pt received seated in w/c in room, perseverating on obtaining a grounds pass so that she can outside with family this date. Per nursing PA has approved grounds pass. Assisted pt with contacting her spouse to let him know grounds pass approved and that pt done with therapy at noon today. Pt then agreeable to PT session. Pt reports ongoing pain under her L breast, not rated and declines intervention. Pt reports R hip bursitis well controlled with use of lidocaine patch. Sit to stand with CGA to RW throughout session. Ambulation x 58 ft, x 75 ft, x 40 ft, x 30 ft with RW and CGA, focus on decreased shoulder elevation and forward gaze as pt easily distracted by environment on R side of body. Pt exhibits L inattention as well as path deviation to the L during gait. Ascend/descend 4 x 6" stairs with 2 handrails and CGA for balance, cues for safe LE management. Ambulation with scanning to the L for targets with RW and CGA for balances, cues to attend to L visual field and for safe RW management. Pt exhibits increase in delay of processing this date as well as increased cues for safety attention to L visual field. Pt left seated in w/c in room with needs in reach, quick release belt and chair alarm in place at end of session.  Therapy Documentation Precautions:  Precautions Precautions: Fall Restrictions Weight  Bearing Restrictions: No    Therapy/Group: Individual Therapy   Excell Seltzer, PT, DPT, CSRS  04/26/2021, 12:09 PM

## 2021-04-26 NOTE — Progress Notes (Signed)
PROGRESS NOTE   Subjective/Complaints: C/o abdominal pain this morning. Notes constipation. Discussed getting KUB. Increased senna-docusate to BID  ROS: Patient denies fever, rash, sore throat, blurred vision, nausea, vomiting, diarrhea, cough,   chest pain, joint or back pain, headache, or mood change. +abdominal pain   Objective:   No results found. No results for input(s): WBC, HGB, HCT, PLT in the last 72 hours.  No results for input(s): NA, K, CL, CO2, GLUCOSE, BUN, CREATININE, CALCIUM in the last 72 hours.    Intake/Output Summary (Last 24 hours) at 04/26/2021 1147 Last data filed at 04/26/2021 0700 Gross per 24 hour  Intake 270 ml  Output 200 ml  Net 70 ml        Physical Exam: Vital Signs Blood pressure (!) 166/77, pulse 63, temperature 97.8 F (36.6 C), resp. rate 18, height 5\' 2"  (1.575 m), weight 82.7 kg, SpO2 100 %. Gen: no distress, normal appearing HEENT: oral mucosa pink and moist, NCAT Cardio: Reg rate Chest: normal effort, normal rate of breathing Abd: soft, non-distended Ext: no edema Psych: pleasant, normal affect Skin: intact Musculoskeletal:        General: No swelling.    Cervical back: Normal range of motion.    Comments: Mild tenderness left shoulder with ER/IR today  Skin:    General: Skin is warm and dry. Neurological:    Comments: alert, follows commands.   Fair insight and awareness. Provided biographical information. Normal language, speech slightly slurred. Mild left central 7. LUE 2 to 2+/5 deltoid, 3/5 biceps, triceps and 3+/5 wrist and HI. LLE 3-4/5 prox to distal. RUE and RLE 4-5/5 prox to distal. No sensory deficits noted--motor/sensory exam stable today.   Assessment/Plan: 1. Functional deficits which require 3+ hours per day of interdisciplinary therapy in a comprehensive inpatient rehab setting. Physiatrist is providing close team supervision and 24 hour management of active  medical problems listed below. Physiatrist and rehab team continue to assess barriers to discharge/monitor patient progress toward functional and medical goals  Care Tool:  Bathing    Body parts bathed by patient: Right arm, Left arm, Chest, Abdomen, Front perineal area, Right upper leg, Left upper leg   Body parts bathed by helper: Right lower leg, Left lower leg, Buttocks     Bathing assist Assist Level: Minimal Assistance - Patient > 75%     Upper Body Dressing/Undressing Upper body dressing   What is the patient wearing?: Pull over shirt    Upper body assist Assist Level: Minimal Assistance - Patient > 75%    Lower Body Dressing/Undressing Lower body dressing      What is the patient wearing?: Pants, Incontinence brief     Lower body assist Assist for lower body dressing: Minimal Assistance - Patient > 75%     Toileting Toileting    Toileting assist Assist for toileting: Minimal Assistance - Patient > 75%     Transfers Chair/bed transfer  Transfers assist     Chair/bed transfer assist level: Moderate Assistance - Patient 50 - 74%     Locomotion Ambulation   Ambulation assist      Assist level: Minimal Assistance - Patient > 75% Assistive device: Walker-rolling  Max distance: 15'   Walk 10 feet activity   Assist     Assist level: Minimal Assistance - Patient > 75% Assistive device: Walker-rolling   Walk 50 feet activity   Assist Walk 50 feet with 2 turns activity did not occur: Safety/medical concerns         Walk 150 feet activity   Assist Walk 150 feet activity did not occur: Safety/medical concerns         Walk 10 feet on uneven surface  activity   Assist Walk 10 feet on uneven surfaces activity did not occur: Safety/medical concerns         Wheelchair     Assist Will patient use wheelchair at discharge?:  (TBD) Type of Wheelchair: Manual    Wheelchair assist level: Dependent - Patient 0% Max wheelchair  distance: 150'    Wheelchair 50 feet with 2 turns activity    Assist        Assist Level: Dependent - Patient 0%   Wheelchair 150 feet activity     Assist      Assist Level: Dependent - Patient 0%   Blood pressure (!) 166/77, pulse 63, temperature 97.8 F (36.6 C), resp. rate 18, height 5\' 2"  (1.575 m), weight 82.7 kg, SpO2 100 %.  Medical Problem List and Plan: 1.  Left side weakness secondary to nonhemorrhagic right basal ganglia infarction             -patient may shower             -ELOS/Goals: 10-12 days, mod I goals for PT, OT, SLP  -Continue CIR therapies including PT, OT, and SLP   2.  Impaired mobility: -DVT/anticoagulation:  continue SCD             -antiplatelet therapy:ASA  7/1- allowed to restart Plavix- can add Plavix in 5-7 days if not bleeding (7/7) 3. Right hip bursitis: continue Tramadol 50 mg every 12 hours as needed, ice, ROM, lidocaine patch 4. Mood: Xanax 0.5 mg 3 times daily as needed.  Provide emotional support             -antipsychotic agents: N/A 5. Neuropsych: This patient is capable of making decisions on her own behalf. 6. Skin/Wound Care: Routine skin checks 7. Fluids/Electrolytes/Nutrition: Routine in and outs with follow-up chemistries 8.  Acute lower GI bleed with blood loss anemia.  Follow-up GI services.  Resume ASA as above. - Continue Protonix 40 mg twice daily  7/2 hgb back to 10.0---monitor for gross bleeding 9.  Chronic diastolic congestive heart failure.  Monitor for any signs of fluid overload.  Continue Lasix 40-80 mg Monday Wednesday Friday             -daily weights  7/1- weight back down from yesterday- con't to monitor 10.  Newly Dx'd End-stage renal disease.  Hemodialysis as per renal services 11.  Hypertension.  Increase Coreg to 6.25 mg twice daily 12.  Asthma.  Continue inhalers as directed  - was getting Home O2 prior to admission- will likely need to go home on 2L O2-   Was trying to get at home, but hadn't yet.    7/1- con't nebs prn, home O2 ?PRN only  7/3 lungs very clear today, sats 99% on RA. Some of SOB may be behaviorally related. Reassured pt today that lungs sounded fine.   -encouraged FV/IS, OOB 13.Hyperlipidemia.Crestor 14.Obesity.BMI 34.31.Dietery follow up 15.  Diabetes mellitus.hemoglobin AIc 5.2.  Blood sugar checks discontinued  16. Leukocytosis  6/29- WBC up to 13.7- don't have anything to compare to - will recheck in AM and determine cause if still elevated.   7/1 down to 11.5- no Sx's of infection-     -cxr only with atelectasis-   flutter valve and con't to monitor 17. Constipation  6/29- LBM 3+ days ago- will order Sorbitol 60cc after therapy- also changed senokot to 1 tab nightly from prn. Moving bowels   18. Fistula placement  6/30- will call [facility] to see if can do while here in rehab- or move til after d/c.   7/1- scheduled for Tuesday 7/5- at 10am 19. Abdominal pain: KUB ordered. May be secondary to constipation, increase senna-docusate to BID.     LOS: 6 days A FACE TO FACE EVALUATION WAS PERFORMED  Martha Clan P Aleecia Tapia 04/26/2021, 11:47 AM

## 2021-04-26 NOTE — Progress Notes (Signed)
Speech Language Pathology Daily Session Note  Patient Details  Name: Carolyn Russo MRN: [id number] Date of Birth: 1951/10/12  Today's Date: 04/26/2021 SLP Individual Time: 9983-3825 SLP Individual Time Calculation (min): 43 min  Short Term Goals: Week 1: SLP Short Term Goal 1 (Week 1): Patient will maintain sustained attention for 5 minute increments during a functional task with Max A multimodal cues. SLP Short Term Goal 2 (Week 1): Patient will attend to left field of enviornment during functional tasks with Mod A verbal cues. SLP Short Term Goal 3 (Week 1): Patient will initiate verbal responses in 75% of opportunities with Max A multimodal cues. SLP Short Term Goal 4 (Week 1): Patient will consume current diet with minimal overt s/s of aspiration with Mod verbal cues for use of swallowing compensastory strategies. SLP Short Term Goal 5 (Week 1): Patient will demosntrate efficient mastication and oral clearance with trials of Dys. 3 textures over 2 sessions prior to upgrade with Mod verbal cues. SLP Short Term Goal 6 (Week 1): Patient will utilize speech intelligibility strategies at the sentence level with Mod verbal cues.  Skilled Therapeutic Interventions: Skilled ST services focused on swallow and cognitive skills. Pt consumed dys 3 texture snack with slightly prolonged mastication time, but appropriate oral clearance. SLP recommends trial tray in upcoming sessions prior to upgrade due to reduced alertness, eyes fluttering closed and fatigue as the session continued. Pt demonstrated difficulty recalling daily events, planned shunt procure for tomorrow and what kind of MD spoke to her this am (kidney MD.) SLP initiated memory notebook and recorded events. Pt was able to read notebook with 85-90% intelligibility at the sentence level with supervision A verbal cues to over articulate, however in conversation with husband on phone, intelligibility decreased to 70% intelligibility. Pt was able to  recall events with supervision A verbal cues for use of memory notebook. Pt was left with call bell within reach and bed alarm set. Recommend to continue ST services.      Pain Pain Assessment Pain Scale: 0-10 Pain Score: 0-No pain Pain Type: Acute pain Pain Location: Abdomen Pain Orientation: Left Pain Descriptors / Indicators: Discomfort Pain Intervention(s): Repositioned  Therapy/Group: Individual Therapy  Deunte Bledsoe  [facility] 04/26/2021, 12:33 PM

## 2021-04-26 NOTE — Progress Notes (Signed)
Occupational Therapy Session Note  Patient Details  Name: Carolyn Russo MRN: [id number] Date of Birth: Apr 12, 1951  Today's Date: 04/26/2021 OT Individual Time: 0930-1040 OT Individual Time Calculation (min): 70 min    Short Term Goals: Week 1:  OT Short Term Goal 1 (Week 1): Pt transfers to toilet wiht MIN A OT Short Term Goal 2 (Week 1): Pt will stand to groom at sink wiht MIN A OT Short Term Goal 3 (Week 1): Pt will don shirt wiht MIN A OT Short Term Goal 4 (Week 1): Pt will recall hemi dressing strategies wiht min VC  Skilled Therapeutic Interventions/Progress Updates:    Pt resting in bed upon arrival and agreeable to getting OOB for therapy. Pt requested to use toilet. Supine>sit EOB with supervision. Squat pivot tranfser to w/c with min A. W/c<>toilet transfer with grab bar at min A. Min A for toileting. Min A for bathing/dressing this morning. Pt noted with increased delay in processing questions/commands. Pt with increased difficulty donning socks and tieing shoes this morning. Pt required more then a reasonable amount of time for all tasks this morning; more so then yesterday. Pt remained in w/c with all needs within reach and belt alarm activated.   Therapy Documentation Precautions:  Precautions Precautions: Fall Restrictions Weight Bearing Restrictions: No Pain:  Pt c/o LLQ abdominal pain; MD and RN aware, emotional support   Therapy/Group: Individual Therapy  Leroy Libman 04/26/2021, 10:41 AM

## 2021-04-27 ENCOUNTER — Encounter (HOSPITAL_COMMUNITY)
Admission: RE | Disposition: A | Discharge status: Other Acute Inpatient Hospital | Payer: Self-pay | Source: Intra-hospital | Attending: Physical Medicine and Rehabilitation

## 2021-04-27 ENCOUNTER — Inpatient Hospital Stay (HOSPITAL_COMMUNITY): Admission: RE | Admit: 2021-04-27 | Payer: Medicare Other | Source: Home / Self Care | Admitting: Vascular Surgery

## 2021-04-27 ENCOUNTER — Telehealth: Payer: Medicare Other

## 2021-04-27 LAB — RENAL FUNCTION PANEL
Albumin: 3.1 g/dL — ABNORMAL LOW (ref 3.5–5.0)
Anion gap: 9 (ref 5–15)
BUN: 41 mg/dL — ABNORMAL HIGH (ref 8–23)
CO2: 25 mmol/L (ref 22–32)
Calcium: 9.2 mg/dL (ref 8.9–10.3)
Chloride: 96 mmol/L — ABNORMAL LOW (ref 98–111)
Creatinine, Ser: 7.15 mg/dL — ABNORMAL HIGH (ref 0.44–1.00)
GFR, Estimated: 6 mL/min — ABNORMAL LOW (ref >60)
Glucose, Bld: 329 mg/dL — ABNORMAL HIGH (ref 70–99)
Phosphorus: 4.5 mg/dL (ref 2.5–4.6)
Potassium: 4.9 mmol/L (ref 3.5–5.1)
Sodium: 130 mmol/L — ABNORMAL LOW (ref 135–145)

## 2021-04-27 LAB — CBC
HCT: 28.5 % — ABNORMAL LOW (ref 36.0–46.0)
Hemoglobin: 9.6 g/dL — ABNORMAL LOW (ref 12.0–15.0)
MCH: 29.1 pg (ref 26.0–34.0)
MCHC: 33.7 g/dL (ref 30.0–36.0)
MCV: 86.4 fL (ref 80.0–100.0)
Platelets: 271 10*3/uL (ref 150–400)
RBC: 3.3 MIL/uL — ABNORMAL LOW (ref 3.87–5.11)
RDW: 14.9 % (ref 11.5–15.5)
WBC: 9.9 10*3/uL (ref 4.0–10.5)
nRBC: 0 % (ref 0.0–0.2)

## 2021-04-27 SURGERY — TRANSPOSITION, VEIN, BASILIC
Anesthesia: Choice | Laterality: Right

## 2021-04-27 MED ORDER — SENNOSIDES-DOCUSATE SODIUM 8.6-50 MG PO TABS
2.0000 | ORAL_TABLET | Freq: Two times a day (BID) | ORAL | Status: DC
  Administered 2021-04-27 – 2021-05-05 (×17): 2 via ORAL
  Filled 2021-04-27 (×17): qty 2

## 2021-04-27 MED ORDER — CARVEDILOL 12.5 MG PO TABS
12.5000 mg | ORAL_TABLET | Freq: Two times a day (BID) | ORAL | Status: DC
  Administered 2021-04-27 – 2021-05-05 (×15): 12.5 mg via ORAL
  Filled 2021-04-27 (×17): qty 1

## 2021-04-27 MED ORDER — SORBITOL 70 % SOLN
45.0000 mL | Freq: Once | Status: AC
  Administered 2021-04-27: 45 mL via ORAL
  Filled 2021-04-27: qty 60

## 2021-04-27 MED ORDER — FLEET ENEMA 7-19 GM/118ML RE ENEM: 1.0000 | ENEMA | Freq: Once | RECTAL | Status: DC | PRN

## 2021-04-27 MED ORDER — LOSARTAN POTASSIUM 50 MG PO TABS
25.0000 mg | ORAL_TABLET | Freq: Every day | ORAL | Status: DC
  Administered 2021-04-28 – 2021-05-02 (×3): 25 mg via ORAL
  Filled 2021-04-27 (×5): qty 1

## 2021-04-27 MED ORDER — HEPARIN SODIUM (PORCINE) 1000 UNIT/ML IJ SOLN
INTRAMUSCULAR | Status: AC
  Filled 2021-04-27: qty 4

## 2021-04-27 MED ORDER — DARBEPOETIN ALFA 100 MCG/0.5ML IJ SOSY: 100.0000 ug | PREFILLED_SYRINGE | INTRAMUSCULAR | Status: DC

## 2021-04-27 NOTE — Progress Notes (Signed)
PROGRESS NOTE   Subjective/Complaints: Sleepy  KUB shows moderate stool burden- no other acute abnormalities CBGs 108-174  ROS: Patient denies fever, rash, sore throat, blurred vision, nausea, vomiting, diarrhea, cough,   chest pain, joint or back pain, headache, or mood change. +abdominal pain   Objective:   DG Abd 1 View  Result Date: 04/26/2021 CLINICAL DATA:  LEFT-sided abdominal pain since yesterday. Constipation. EXAM: ABDOMEN - 1 VIEW COMPARISON:  CT of the abdomen and pelvis on 12/13/2004 FINDINGS: Bowel gas pattern is nonobstructive. Moderate stool burden noted within nondilated loops of colon. Scoliosis is associated with degenerative changes in the lumbar spine. IMPRESSION: No evidence for acute  abnormality.  Moderate stool burden. Electronically Signed   By: Nolon Nations M.D.   On: 04/26/2021 12:44   Recent Labs    04/27/21 1017  WBC 9.9  HGB 9.6*  HCT 28.5*  PLT 271    Recent Labs    04/27/21 1017  NA 130*  K 4.9  CL 96*  CO2 25  GLUCOSE 329*  BUN 41*  CREATININE 7.15*  CALCIUM 9.2      Intake/Output Summary (Last 24 hours) at 04/27/2021 1211 Last data filed at 04/27/2021 0700 Gross per 24 hour  Intake 720 ml  Output 300 ml  Net 420 ml        Physical Exam: Vital Signs Blood pressure (!) 163/72, pulse 69, temperature 98 F (36.7 C), resp. rate 16, height 5\' 2"  (1.575 m), weight 81 kg, SpO2 100 %. Gen: no distress, normal appearing HEENT: oral mucosa pink and moist, NCAT Cardio: Reg rate Chest: normal effort, normal rate of breathing Abd: soft, non-distended Ext: no edema Psych: pleasant, flat affect Musculoskeletal:        General: No swelling.    Cervical back: Normal range of motion.    Comments: Mild tenderness left shoulder with ER/IR today  Skin:    General: Skin is warm and dry. Neurological:    Comments: alert, follows commands.   Fair insight and awareness. Provided  biographical information. Normal language, speech slightly slurred. Mild left central 7. LUE 2 to 2+/5 deltoid, 3/5 biceps, triceps and 3+/5 wrist and HI. LLE 3-4/5 prox to distal. RUE and RLE 4-5/5 prox to distal. No sensory deficits noted--motor/sensory exam stable today.   Assessment/Plan: 1. Functional deficits which require 3+ hours per day of interdisciplinary therapy in a comprehensive inpatient rehab setting. Physiatrist is providing close team supervision and 24 hour management of active medical problems listed below. Physiatrist and rehab team continue to assess barriers to discharge/monitor patient progress toward functional and medical goals  Care Tool:  Bathing    Body parts bathed by patient: Right arm, Left arm, Chest, Abdomen, Front perineal area, Right upper leg, Left upper leg   Body parts bathed by helper: Right lower leg, Left lower leg, Buttocks     Bathing assist Assist Level: Minimal Assistance - Patient > 75%     Upper Body Dressing/Undressing Upper body dressing   What is the patient wearing?: Pull over shirt    Upper body assist Assist Level: Minimal Assistance - Patient > 75%    Lower Body Dressing/Undressing Lower body dressing  What is the patient wearing?: Pants, Incontinence brief     Lower body assist Assist for lower body dressing: Minimal Assistance - Patient > 75%     Toileting Toileting    Toileting assist Assist for toileting: Minimal Assistance - Patient > 75%     Transfers Chair/bed transfer  Transfers assist     Chair/bed transfer assist level: Contact Guard/Touching assist     Locomotion Ambulation   Ambulation assist      Assist level: Contact Guard/Touching assist Assistive device: Walker-rolling Max distance: 75'   Walk 10 feet activity   Assist     Assist level: Contact Guard/Touching assist Assistive device: Walker-rolling   Walk 50 feet activity   Assist Walk 50 feet with 2 turns activity did  not occur: Safety/medical concerns  Assist level: Contact Guard/Touching assist Assistive device: Walker-rolling    Walk 150 feet activity   Assist Walk 150 feet activity did not occur: Safety/medical concerns         Walk 10 feet on uneven surface  activity   Assist Walk 10 feet on uneven surfaces activity did not occur: Safety/medical concerns         Wheelchair     Assist Will patient use wheelchair at discharge?:  (TBD) Type of Wheelchair: Manual    Wheelchair assist level: Dependent - Patient 0% Max wheelchair distance: 150'    Wheelchair 50 feet with 2 turns activity    Assist        Assist Level: Dependent - Patient 0%   Wheelchair 150 feet activity     Assist      Assist Level: Dependent - Patient 0%   Blood pressure (!) 163/72, pulse 69, temperature 98 F (36.7 C), resp. rate 16, height 5\' 2"  (1.575 m), weight 81 kg, SpO2 100 %.  Medical Problem List and Plan: 1.  Left side weakness secondary to nonhemorrhagic right basal ganglia infarction             -patient may shower             -ELOS/Goals: 10-12 days, mod I goals for PT, OT, SLP  -Continue CIR therapies including PT, OT, and SLP    -Interdisciplinary Team Conference today   2.  Impaired mobility: -DVT/anticoagulation:  continue SCD             -antiplatelet therapy:ASA  7/1- allowed to restart Plavix- can add Plavix in 5-7 days if not bleeding (7/7) 3. Right hip bursitis: continue Tramadol 50 mg every 12 hours as needed, ice, ROM, lidocaine patch 4. Mood: Xanax 0.5 mg 3 times daily as needed.  Provide emotional support             -antipsychotic agents: N/A 5. Neuropsych: This patient is capable of making decisions on her own behalf. 6. Skin/Wound Care: Routine skin checks 7. Fluids/Electrolytes/Nutrition: Routine in and outs with follow-up chemistries 8.  Acute lower GI bleed with blood loss anemia.  Follow-up GI services.  Resume ASA as above. - Continue Protonix 40 mg  twice daily  7/2 hgb back to 10.0---monitor for gross bleeding 9.  Chronic diastolic congestive heart failure.  Monitor for any signs of fluid overload.  Continue Lasix 40-80 mg Monday Wednesday Friday             -daily weights  7/1- weight back down from yesterday- con't to monitor 10.  Newly Dx'd End-stage renal disease.  Hemodialysis as per renal services 11.  Hypertension.  Increase Coreg to 12.5  mg twice daily 12.  Asthma.  Continue inhalers as directed  - was getting Home O2 prior to admission- will likely need to go home on 2L O2-   Was trying to get at home, but hadn't yet.   7/1- con't nebs prn, home O2 ?PRN only  7/3 lungs very clear today, sats 99% on RA. Some of SOB may be behaviorally related. Reassured pt today that lungs sounded fine.   -encouraged FV/IS, OOB 13.Hyperlipidemia.Crestor 14.Obesity.BMI 34.31.Dietery follow up 15.  Diabetes mellitus.hemoglobin AIc 5.2.  Blood sugar checks discontinued  16. Leukocytosis: resolved 17. Constipation: KUB shows moderate stool burden. Increase senna-docusate to BID.   18. Fistula placement: scheduled for Friday.    LOS: 7 days A FACE TO FACE EVALUATION WAS PERFORMED  Carolyn Russo 04/27/2021, 12:11 PM

## 2021-04-27 NOTE — Progress Notes (Signed)
[facility] Progress Note   Subjective:   Patient resting today without complaints.  Mixup with dialysis yesterday.  Planning on dialysis today  Objective Vitals:   04/27/21 0500 04/27/21 0505 04/27/21 0737 04/27/21 0912  BP:  (!) 151/69  (!) 163/72  Pulse:  67 68 69  Resp:  16 16   Temp:  98 F (36.7 C)    TempSrc:      SpO2:  98% 98% 100%  Weight: 81 kg     Height:       Physical Exam General:well appearing female in NAD Heart:normal rate, no murmurs Lungs:bilateral chest rise with no iwob Abdomen:soft, NTND Extremities:no LE edema Dialysis Access: [facility]   Filed Weights   04/25/21 0500 04/26/21 0440 04/27/21 0500  Weight: 81.4 kg 82.7 kg 81 kg    Intake/Output Summary (Last 24 hours) at 04/27/2021 0924 Last data filed at 04/27/2021 0700 Gross per 24 hour  Intake 720 ml  Output 400 ml  Net 320 ml    Additional Objective Labs: Basic Metabolic Panel: Recent Labs  Lab 04/23/21 0436  NA 134*  K 3.6  CL 96*  CO2 30  GLUCOSE 262*  BUN 14  CREATININE 5.18*  CALCIUM 8.9  PHOS 4.4   Liver Function Tests: Recent Labs  Lab 04/23/21 0436  ALBUMIN 3.2*    CBC: Recent Labs  Lab 04/20/21 1002 04/22/21 0502 04/23/21 0436  WBC 13.7* 12.5* 11.5*  NEUTROABS  --  7.0 6.1  HGB 9.8* 9.4* 10.0*  HCT 29.4* 28.3* 30.0*  MCV 89.1 90.7 90.4  PLT 246 251 268    CBG: Recent Labs  Lab 04/20/21 1217 04/21/21 1553  GLUCAP 174* 117*    Medications:   arformoterol  15 mcg Nebulization BID   aspirin EC  81 mg Oral Daily   budesonide (PULMICORT) nebulizer solution  0.5 mg Nebulization BID   carvedilol  6.25 mg Oral BID WC   Chlorhexidine Gluconate Cloth  6 each Topical BID   fluticasone  2 spray Each Nare Daily   furosemide  40 mg Oral Q M,W,F   hydrocortisone  25 mg Rectal BID   ketotifen  2 drop Both Eyes BID   lidocaine  1 patch Transdermal Q24H   losartan  25 mg Oral Daily   pantoprazole  40 mg Oral BID   rosuvastatin  20 mg Oral Daily    senna-docusate  1 tablet Oral BID   sevelamer carbonate  0.8 g Oral TID WC    Dialysis Orders: [facility]-MWF 4 hrs 180NRe 400/Autoflow 1.5 86.5 kg 2.0K/2.0 Ca TDC -heparin 2000 units IV TIW -Mircera 100 mcg IV q 2 weeks (last dose 04/14/2021 -Hectorol 5 mcg IV TIW   Assessment/Plan: Acute subcortical R CVA-per Primary/Neurology.  In CIR. Acute LGIB- Hx hemorrhoidal bleeding in past. new onset 06/25 post clopidogrel load. Hbg drop 9>7.2, improved to 10.0 s/p 2 units PRBCS on 04/18/21. Clopidogrel and ASA on hold. No heparin with HD. Per GI: EGD and flex sig with no etiology and bleeding scan (-). No additional bleeding per patient.  ESRD -  MWF.  Off heparin d/t new GIB. Continue 3-4.0 K bath. Got off schedule. Plan for HD TTS this week given surgery on Friday then resume MWF next week  Hypertension/volume  -BP has been high. Below EDW at 80-82kg. Challenging weight. Added losartan 25mg  daily today. Anemia - Hgb 10.0. As noted in # 2. Next ESA dose due 7/6. Will give aranesp 171mcg today and switch back to  Wednesdays when she is back on schedule. Transfuse PRN. Metabolic bone disease -  Ca in goal.  PTH only 110 03/26/21 on 5 mcg Hectorol, hectorol was held at that time.  Rechecked pth 128. No need to restart VDRA. Last PO4 at goal. Changed renvela to powder d/t patient inability to swallow pills.  Nutrition - Now on dysphagia diet.  DTM2- per primary Pleuritic abdominal/chest pain: improved today HD access: Has TDC. Was scheduled for basilic vein transposition today but didn't realize it. Planning for surgery on Friday. Appreciate help from Dr. Scot Dock.  [facility] 04/27/2021,9:24 AM  LOS: 7 days

## 2021-04-27 NOTE — Patient Care Conference (Signed)
Inpatient RehabilitationTeam Conference and Plan of Care Update Date: 04/27/2021   Time: 11:10 AM    Patient Name: Carolyn Russo      Medical Record Number: [id number]  Date of Birth: 06/13/1951 Sex: Female         Room/Bed: 4W03C/4W03C-01 Payor Info: Payor: MEDICARE / Plan: MEDICARE PART A AND B / Product Type: *No Product type* /    Admit Date/Time:  04/20/2021  3:53 PM  Primary Diagnosis:  Cerebrovascular accident (CVA) of right basal ganglia [facility])  Hospital Problems: Principal Problem:   Cerebrovascular accident (CVA) of right basal ganglia ([place]) Active Problems:   ESRD on dialysis [facility])   Bandemia    Expected Discharge Date: Expected Discharge Date: 05/06/21  Team Members Present: Physician leading conference: Dr. Alger Simons Care Coodinator Present: Erlene Quan, BSW;Karrah Mangini Creig Hines, RN, BSN, CRRN Nurse Present: Dorthula Nettles, RN PT Present: Excell Seltzer, PT OT Present: Roanna Epley, COTA;Jennifer Tamala Julian, OT SLP Present: Weston Anna, SLP PPS Coordinator present : Gunnar Fusi, SLP     Current Status/Progress Goal Weekly Team Focus  Bowel/Bladder   incont at times of Bladder.Last BM-6/30- constipation  regain cont. regular  Bowels  assess q shift and PRN   Swallow/Nutrition/ Hydration   dys 2 textures and thin liquids, min-mod A  Supervision A  dys 3 trials and carryover of swallow startegies, alertness/left pocketing   ADL's   bathing at sink-min A: UB/LB dressing with sit<>stand-min A; toileting-min A; functional transfers-min/mod A; delayed processing of questions/commands; using LUE in functional tasks  supervision overall  activity tolerance, functional transfers, BADL retraining, attention to Lt; education   Mobility   min to mod A transfers, gait up to 35 ft with RW min A  Supervision transfers and gait with LRAD; min A stairs  endurance, balance, gait, stairs   Communication   70-80% intelligibile at sentence level, min A, impacted by fatigue   Mod I  speech intelligibility strategies   Safety/Cognition/ Behavioral Observations  Mod A  Min A  problem solving, recall, awareness and sustained attention   Pain   pain 5of 10 to abdomen.  pain<3  assess pain q shift and PRN   Skin   intact  remain intact  assess skin q shift and PRN     Discharge Planning:  Pt to d/c to home with husband who is working on providing 24/7 care by hiring private aide until he comes home from work.   Team Discussion: Had stomach pain yesterday, SOB, maybe anxious. KUB completed and shows small amount of stool. Had BM today. BP is elevated. Incontinent at times. Complains of left side abdominal pain. Has right upper chest cath for hemo.  Patient on target to meet rehab goals: yes, contact guard/min assist for transfer with RW. Left inattention, easily distracted. Contact guard/min assist and sometimes will ask for assistance but will then do it herself.  *See Care Plan and progress notes for long and short-term goals.   Revisions to Treatment Plan:  Medication given for constipation.  Teaching Needs: Family education, medication management, pain management, bowel management, skin/wound care, transfer training, gait training, balance training, endurance training, stair training, safety awareness.  Current Barriers to Discharge: Decreased caregiver support, Medical stability, Home enviroment access/layout, Incontinence, Wound care, Lack of/limited family support, Weight, Hemodialysis, Weight bearing restrictions, Medication compliance, and Behavior  Possible Resolutions to Barriers: Continue current medications, provide emotional support.     Medical Summary Current Status: right bg infarct with left HP. sob d/t chronic  asthma. sats/breathing have been WNL. abdominal pain. kub normal with some stool burden  Barriers to Discharge: Medical stability   Possible Resolutions to Barriers/Weekly Focus: advance bowel program. maximize breathing,  nebs/treatments to improve lung aeration   Continued Need for Acute Rehabilitation Level of Care: The patient requires daily medical management by a physician with specialized training in physical medicine and rehabilitation for the following reasons: Direction of a multidisciplinary physical rehabilitation program to maximize functional independence : Yes Medical management of patient stability for increased activity during participation in an intensive rehabilitation regime.: Yes Analysis of laboratory values and/or radiology reports with any subsequent need for medication adjustment and/or medical intervention. : Yes   I attest that I was present, lead the team conference, and concur with the assessment and plan of the team.   Cristi Loron 04/27/2021, 5:08 PM

## 2021-04-27 NOTE — Progress Notes (Signed)
VASCULAR SURGERY:  This patient was scheduled for a right first stage basilic vein transposition today as an outpatient.  We were unaware that she was admitted to the hospital and for this reason she was fed breakfast this morning.  We will therefore have to reschedule her surgery for later in the week.  She was admitted with a GI bleed and a CVA on 04/16/2021.  She has a functioning catheter.  We need to be sure that she is medically stable also to proceed with surgery.  I will tentatively schedule for her surgery on Friday.  Gae Gallop, MD 7:34 AM

## 2021-04-27 NOTE — Progress Notes (Signed)
Hemodialysis- Patient refused to complete her treatment today. Completed approx 2.5 hours before signing off. All vitals remained stable throughout. Did have one bp drop resulting in uf off x 15 minutes. Post BP 156/80. Report called to 4W. Total UF 0.5L

## 2021-04-27 NOTE — Progress Notes (Signed)
Occupational Therapy Weekly Progress Note  Patient Details  Name: Carolyn Russo MRN: [id number] Date of Birth: January 30, 1951  Beginning of progress report period: April 21, 2021 End of progress report period: April 27, 2021   Patient has met 4 of 4 short term goals.  Pt is making steady progress with BADLs and functional transfers. Pt currently requires min A/CGA for bathing/dressing with sit<>stand from w/c at sink. Stand pivot transfers with CGA. Toilet transfers with CGA using grab bar. Toileting with CGA when standing for clothing mgmt. Pt with delayed processing of questions and commands. Pt requires more then a reasonable amount of time to initiate and complete all tasks.   Patient continues to demonstrate the following deficits: muscle weakness, decreased cardiorespiratoy endurance, impaired timing and sequencing, decreased coordination, and decreased motor planning, decreased attention to left and decreased motor planning, decreased attention, decreased awareness, decreased problem solving, decreased safety awareness, and delayed processing, and decreased standing balance, decreased postural control, hemiplegia, and decreased balance strategies and therefore will continue to benefit from skilled OT intervention to enhance overall performance with BADL and Reduce care partner burden.  Patient progressing toward long term goals..  Continue plan of care.  OT Short Term Goals Week 1:  OT Short Term Goal 1 (Week 1): Pt transfers to toilet wiht MIN A OT Short Term Goal 1 - Progress (Week 1): Met OT Short Term Goal 2 (Week 1): Pt will stand to groom at sink wiht MIN A OT Short Term Goal 2 - Progress (Week 1): Met OT Short Term Goal 3 (Week 1): Pt will don shirt wiht MIN A OT Short Term Goal 3 - Progress (Week 1): Met OT Short Term Goal 4 (Week 1): Pt will recall hemi dressing strategies wiht min VC OT Short Term Goal 4 - Progress (Week 1): Met Week 2:  OT Short Term Goal 2 (Week 2): STG=LTG 2/2  ELOS    Therapy/Group: Individual Therapy  Leroy Libman 04/27/2021, 9:54 AM

## 2021-04-28 ENCOUNTER — Encounter (HOSPITAL_COMMUNITY): Payer: Self-pay | Admitting: Vascular Surgery

## 2021-04-28 DIAGNOSIS — N185 Chronic kidney disease, stage 5: Secondary | ICD-10-CM

## 2021-04-28 MED ORDER — CEFAZOLIN SODIUM-DEXTROSE 2-4 GM/100ML-% IV SOLN
2.0000 g | INTRAVENOUS | Status: AC
  Filled 2021-04-28: qty 100

## 2021-04-28 MED ORDER — DARBEPOETIN ALFA 100 MCG/0.5ML IJ SOSY: 100.0000 ug | PREFILLED_SYRINGE | INTRAMUSCULAR | Status: AC

## 2021-04-28 MED ORDER — DARBEPOETIN ALFA 100 MCG/0.5ML IJ SOSY: 100.0000 ug | PREFILLED_SYRINGE | INTRAMUSCULAR | Status: DC

## 2021-04-28 NOTE — Progress Notes (Signed)
Physical Therapy Weekly Progress Note  Patient Details  Name: Carolyn Russo MRN: [id number] Date of Birth: 09/21/1951  Beginning of progress report period: April 21, 2021 End of progress report period: April 28, 2021  Today's Date: 04/28/2021 PT Individual Time: 0223-3612 PT Individual Time Calculation (min): 70 min   Patient has met 4 of 4 short term goals.  Pt is making good progress towards therapy goals. She is currently at Supervision level for bed mobility, CGA for transfers with RW, CGA for gait up to 115 ft, and CGA for stairs. Pt remains limited by cognitive deficits, L inattention, and decreased endurance/tolerance for therapy.  Patient continues to demonstrate the following deficits muscle weakness, decreased cardiorespiratoy endurance, decreased attention to left, decreased attention, decreased awareness, decreased problem solving, decreased safety awareness, decreased memory, and delayed processing, and decreased sitting balance, decreased standing balance, decreased postural control, hemiplegia, and decreased balance strategies and therefore will continue to benefit from skilled PT intervention to increase functional independence with mobility.  Patient progressing toward long term goals..  Continue plan of care.  PT Short Term Goals Week 1:  PT Short Term Goal 1 (Week 1): Pt will complete least restrictive transfers with min A PT Short Term Goal 1 - Progress (Week 1): Met PT Short Term Goal 2 (Week 1): Pt will initiate stair training PT Short Term Goal 2 - Progress (Week 1): Met PT Short Term Goal 3 (Week 1): Pt will ambulate x 50 ft with LRAD and min A PT Short Term Goal 3 - Progress (Week 1): Met PT Short Term Goal 4 (Week 1): Pt will tolerate sitting OOB in chair x 1 hour PT Short Term Goal 4 - Progress (Week 1): Met Week 2:  PT Short Term Goal 1 (Week 2): =LTG due to ELOS  Skilled Therapeutic Interventions/Progress Updates:    Pt received seated in w/c in room, agreeable  to PT session. Pt reports ongoing pain under L breast, declines intervention and not rated. Sit to stand with CGA to RW throughout session. Ambulation x 115 ft, x 80 ft with RW and CGA, cues for decreased shoulder elevation, increased gait speed, and increased attention to midline. Pt requires cues for safe RW management throughout session as she tends to push RW out ahead of her. Ascend/descend 4 x 6" stairs laterally with R handrail and CGA for balance. Standing and seated task placing cards in L visual field with cues to scan to the L and to use LUE>RUE. Pt requires assist to elevate L shoulder to reach targets due to pain in shoulder and muscular weakness. Pt very fatigued at end of session, requests to return to bed. Stand pivot transfer w/c to bed with RW and CGA. Sit to supine Supervision. Pt left seated in bed with needs in reach, bed alarm in place at end of session.  Therapy Documentation Precautions:  Precautions Precautions: Fall Restrictions Weight Bearing Restrictions: No    Therapy/Group: Individual Therapy   Excell Seltzer, PT, DPT, CSRS 04/28/2021, 12:18 PM

## 2021-04-28 NOTE — Progress Notes (Signed)
[facility] Progress Note   Subjective:   Patient sitting in chair with no distress eating breakfast.  States that she was having left-sided chest pain that was positional yesterday and this was the reason she got off early.  She understands she needs to try and complete her treatments.  No other complaints  Objective Vitals:   04/27/21 2022 04/27/21 2050 04/27/21 2101 04/28/21 0422  BP:  (!) 169/83  (!) 186/76  Pulse:  72  76  Resp:    18  Temp:  97.8 F (36.6 C)  98.5 F (36.9 C)  TempSrc:  Oral  Oral  SpO2: 100% 100% 100% 96%  Weight:    83 kg  Height:       Physical Exam General:well appearing female in NAD Heart:normal rate, no murmurs Lungs:bilateral chest rise with no iwob Abdomen:soft, NTND Extremities:no LE edema [facility]ialysis Access: [facility]   Filed Weights   04/27/21 1215 04/27/21 1452 04/28/21 0422  Weight: 83.1 kg 82.6 kg 83 kg    Intake/Output Summary (Last 24 hours) at 04/28/2021 0940 Last data filed at 04/28/2021 0919 Gross per 24 hour  Intake 960 ml  Output 538 ml  Net 422 ml    Additional Objective Labs: Basic Metabolic Panel: Recent Labs  Lab 04/23/21 0436 04/27/21 1017  NA 134* 130*  K 3.6 4.9  CL 96* 96*  CO2 30 25  GLUCOSE 262* 329*  BUN 14 41*  CREATININE 5.18* 7.15*  CALCIUM 8.9 9.2  PHOS 4.4 4.5   Liver Function Tests: Recent Labs  Lab 04/23/21 0436 04/27/21 1017  ALBUMIN 3.2* 3.1*    CBC: Recent Labs  Lab 04/22/21 0502 04/23/21 0436 04/27/21 1017  WBC 12.5* 11.5* 9.9  NEUTROABS 7.0 6.1  --   HGB 9.4* 10.0* 9.6*  HCT 28.3* 30.0* 28.5*  MCV 90.7 90.4 86.4  PLT 251 268 271    CBG: Recent Labs  Lab 04/21/21 1553  GLUCAP 117*    Medications:  [START ON 04/30/2021]  ceFAZolin (ANCEF) IV      arformoterol  15 mcg Nebulization BID   aspirin EC  81 mg Oral Daily   budesonide (PULMICORT) nebulizer solution  0.5 mg Nebulization BID   carvedilol  12.5 mg Oral BID WC   Chlorhexidine Gluconate Cloth  6 each  Topical BID   darbepoetin (ARANESP) injection - DIALYSIS  100 mcg Intravenous Q Tue-HD   fluticasone  2 spray Each Nare Daily   furosemide  40 mg Oral Q M,W,F   hydrocortisone  25 mg Rectal BID   ketotifen  2 drop Both Eyes BID   lidocaine  1 patch Transdermal Q24H   losartan  25 mg Oral Daily   pantoprazole  40 mg Oral BID   rosuvastatin  20 mg Oral Daily   senna-docusate  2 tablet Oral BID   sevelamer carbonate  0.8 g Oral TID WC    Dialysis Orders: [facility]-MWF 4 hrs 180NRe 400/Autoflow 1.5 86.5 kg 2.0K/2.0 Ca TDC -heparin 2000 units IV TIW -Mircera 100 mcg IV q 2 weeks (last dose 04/14/2021 -Hectorol 5 mcg IV TIW   Assessment/Plan: Acute subcortical R CVA-per Primary/Neurology.  In CIR. Acute LGIB- Hx hemorrhoidal bleeding in past. new onset 06/25 post clopidogrel load. Hbg drop 9>7.2, improved to 10.0 s/p 2 units PRBCS on 04/18/21. Clopidogrel and ASA on hold. No heparin with HD. Per GI: EGD and flex sig with no etiology and bleeding scan (-). No additional bleeding per patient.  ESRD -  MWF.  Off heparin d/t new GIB. Continue 3-4.0 K bath. Got off schedule. Plan for HD TTS this week given surgery on Friday then resume MWF next week  Hypertension/volume  -BP has been high. Below EDW at 80-82kg. Challenging weight but minimal ultrafiltration yesterday because she signed off early.  Aggressive ultrafiltration with dialysis tomorrow Anemia - Hgb 9.6. As noted in # 2. aranesp 135mcg dosed on 7/5 and switch back to Wednesdays when she is back on schedule. Transfuse PRN. Metabolic bone disease -  Ca in goal.  PTH only 110 03/26/21 on 5 mcg Hectorol, hectorol was held at that time.  Rechecked pth 128. No need to restart VDRA. Last PO4 at goal. Changed renvela to powder d/t patient inability to swallow pills.  Nutrition - Now on dysphagia diet.  DTM2- per primary Pleuritic abdominal/chest pain: improved today HD access: Has TDC. basilic vein transposition Friday. Appreciate help from Dr.  Scot Dock.  [facility] 04/28/2021,9:40 AM  LOS: 8 days

## 2021-04-28 NOTE — Progress Notes (Signed)
Speech Language Pathology Daily Session Note  Patient Details  Name: Carolyn Russo MRN: [id number] Date of Birth: [birth year]  Today's Date: 04/28/2021 SLP Individual Time: 1120-1200 SLP Individual Time Calculation (min): 40 min  Short Term Goals: Week 1: SLP Short Term Goal 1 (Week 1): Patient will maintain sustained attention for 5 minute increments during a functional task with Max A multimodal cues. SLP Short Term Goal 2 (Week 1): Patient will attend to left field of enviornment during functional tasks with Mod A verbal cues. SLP Short Term Goal 3 (Week 1): Patient will initiate verbal responses in 75% of opportunities with Max A multimodal cues. SLP Short Term Goal 4 (Week 1): Patient will consume current diet with minimal overt s/s of aspiration with Mod verbal cues for use of swallowing compensastory strategies. SLP Short Term Goal 5 (Week 1): Patient will demosntrate efficient mastication and oral clearance with trials of Dys. 3 textures over 2 sessions prior to upgrade with Mod verbal cues. SLP Short Term Goal 6 (Week 1): Patient will utilize speech intelligibility strategies at the sentence level with Mod verbal cues.  Skilled Therapeutic Interventions:   Patient seen to address cognitive-linguistic goals. Upon arrival in patient's room, she was asleep but easily awakened. She had difficulty maintaining alertness throughout entire session however. Patient asked SLP "are you the anesthesiologist?"  And when further questioned, patient reported she did not recognize SLP despite fact that this SLP has worked with patient 3 other times. Patient did recall some recent events related to nursing, medical care and therapy with minA cues. SLP had to redirect her when she got a phone call from what appears to have been disability determination; SLP noticed patient was starting to nod off during call and so advised her to have them call back. Patient continued to exhibit poor recall overall and  cognitive processing was moderately impaired. She continues to benefit from skilled SLP intervention to maximize cognitive, speech and swallow function prior to discharge.  Pain Pain Assessment Pain Scale: 0-10 Pain Score: 0-No pain  Therapy/Group: Individual Therapy  Sonia Baller, MA, CCC-SLP Speech Therapy

## 2021-04-28 NOTE — Progress Notes (Signed)
PROGRESS NOTE   Subjective/Complaints: No distress Sitting upright Left sided chest pain yesterday SBP up to 186  ROS: Patient denies fever, rash, sore throat, blurred vision, nausea, vomiting, diarrhea, cough,   chest pain, joint or back pain, headache, or mood change. +abdominal pain, +left sided chest pain   Objective:   DG Abd 1 View  Result Date: 04/26/2021 CLINICAL DATA:  LEFT-sided abdominal pain since yesterday. Constipation. EXAM: ABDOMEN - 1 VIEW COMPARISON:  CT of the abdomen and pelvis on 12/13/2004 FINDINGS: Bowel gas pattern is nonobstructive. Moderate stool burden noted within nondilated loops of colon. Scoliosis is associated with degenerative changes in the lumbar spine. IMPRESSION: No evidence for acute  abnormality.  Moderate stool burden. Electronically Signed   By: Nolon Nations M.D.   On: 04/26/2021 12:44   Recent Labs    04/27/21 1017  WBC 9.9  HGB 9.6*  HCT 28.5*  PLT 271    Recent Labs    04/27/21 1017  NA 130*  K 4.9  CL 96*  CO2 25  GLUCOSE 329*  BUN 41*  CREATININE 7.15*  CALCIUM 9.2      Intake/Output Summary (Last 24 hours) at 04/28/2021 1217 Last data filed at 04/28/2021 0919 Gross per 24 hour  Intake 960 ml  Output 538 ml  Net 422 ml        Physical Exam: Vital Signs Blood pressure (!) 186/76, pulse 76, temperature 98.5 F (36.9 C), temperature source Oral, resp. rate 18, height 5\' 2"  (1.575 m), weight 83 kg, SpO2 96 %. Gen: no distress, normal appearing HEENT: oral mucosa pink and moist, NCAT Cardio: Reg rate Chest: normal effort, normal rate of breathing Abd: soft, non-distended Ext: no edema Psych: pleasant, flat affect Musculoskeletal:        General: No swelling.    Cervical back: Normal range of motion.    Comments: Mild tenderness left shoulder with ER/IR today  Skin:    General: Skin is warm and dry. Neurological:    Comments: alert, follows commands.    Fair insight and awareness. Provided biographical information. Normal language, speech slightly slurred. Mild left central 7. LUE 2 to 2+/5 deltoid, 3/5 biceps, triceps and 3+/5 wrist and HI. LLE 3-4/5 prox to distal. RUE and RLE 4-5/5 prox to distal. No sensory deficits noted--motor/sensory exam stable today.   Assessment/Plan: 1. Functional deficits which require 3+ hours per day of interdisciplinary therapy in a comprehensive inpatient rehab setting. Physiatrist is providing close team supervision and 24 hour management of active medical problems listed below. Physiatrist and rehab team continue to assess barriers to discharge/monitor patient progress toward functional and medical goals  Care Tool:  Bathing    Body parts bathed by patient: Right arm, Left arm, Chest, Abdomen, Front perineal area, Right upper leg, Left upper leg   Body parts bathed by helper: Right lower leg, Left lower leg, Buttocks     Bathing assist Assist Level: Minimal Assistance - Patient > 75%     Upper Body Dressing/Undressing Upper body dressing   What is the patient wearing?: Pull over shirt    Upper body assist Assist Level: Minimal Assistance - Patient > 75%  Lower Body Dressing/Undressing Lower body dressing      What is the patient wearing?: Pants, Incontinence brief     Lower body assist Assist for lower body dressing: Minimal Assistance - Patient > 75%     Toileting Toileting    Toileting assist Assist for toileting: Minimal Assistance - Patient > 75%     Transfers Chair/bed transfer  Transfers assist     Chair/bed transfer assist level: Contact Guard/Touching assist     Locomotion Ambulation   Ambulation assist      Assist level: Contact Guard/Touching assist Assistive device: Walker-rolling Max distance: 75'   Walk 10 feet activity   Assist     Assist level: Contact Guard/Touching assist Assistive device: Walker-rolling   Walk 50 feet activity   Assist  Walk 50 feet with 2 turns activity did not occur: Safety/medical concerns  Assist level: Contact Guard/Touching assist Assistive device: Walker-rolling    Walk 150 feet activity   Assist Walk 150 feet activity did not occur: Safety/medical concerns         Walk 10 feet on uneven surface  activity   Assist Walk 10 feet on uneven surfaces activity did not occur: Safety/medical concerns         Wheelchair     Assist Will patient use wheelchair at discharge?:  (TBD) Type of Wheelchair: Manual    Wheelchair assist level: Dependent - Patient 0% Max wheelchair distance: 150'    Wheelchair 50 feet with 2 turns activity    Assist        Assist Level: Dependent - Patient 0%   Wheelchair 150 feet activity     Assist      Assist Level: Dependent - Patient 0%   Blood pressure (!) 186/76, pulse 76, temperature 98.5 F (36.9 C), temperature source Oral, resp. rate 18, height 5\' 2"  (1.575 m), weight 83 kg, SpO2 96 %.  Medical Problem List and Plan: 1.  Left side weakness secondary to nonhemorrhagic right basal ganglia infarction             -patient may shower             -ELOS/Goals: 10-12 days, mod I goals for PT, OT, SLP  -Continue CIR therapies including PT, OT, and SLP   2.  Impaired mobility: -DVT/anticoagulation:  continue SCD             -antiplatelet therapy:ASA  7/1- allowed to restart Plavix- can add Plavix in 5-7 days if not bleeding (7/7) 3. Right hip bursitis: continue Tramadol 50 mg every 12 hours as needed, ice, ROM, lidocaine patch 4. Mood: Xanax 0.5 mg 3 times daily as needed.  Provide emotional support             -antipsychotic agents: N/A 5. Neuropsych: This patient is capable of making decisions on her own behalf. 6. Skin/Wound Care: Routine skin checks 7. Fluids/Electrolytes/Nutrition: Routine in and outs with follow-up chemistries 8.  Acute lower GI bleed with blood loss anemia.  Follow-up GI services.  Resume ASA as above. -  Continue Protonix 40 mg twice daily 7/6: hgb 9.6--monitor for gross bleeding 9.  Chronic diastolic congestive heart failure.  Monitor for any signs of fluid overload.  Continue Lasix 40-80 mg Monday Wednesday Friday             -daily weights, have been stable 10.  Newly Dx'd End-stage renal disease.  Hemodialysis as per renal services 11.  Hypertension.  Increase Coreg to 12.5 mg twice daily.  Aggressive filtration with ultradialysis tomorrow 12.  Asthma.  Continue inhalers as directed  - was getting Home O2 prior to admission- will likely need to go home on 2L O2-   Was trying to get at home, but hadn't yet.   7/1- con't nebs prn, home O2 ?PRN only  7/3 lungs very clear today, sats 99% on RA. Some of SOB may be behaviorally related. Reassured pt today that lungs sounded fine.   -encouraged FV/IS, OOB 13.Hyperlipidemia.Crestor 14.Obesity.BMI 34.31.Dietery follow up 15.  Diabetes mellitus.hemoglobin AIc 5.2.  Blood sugar checks discontinued  16. Leukocytosis: resolved 17. Constipation: KUB shows moderate stool burden. Increase senna-docusate to BID.   18. Fistula placement: scheduled for Friday.    LOS: 8 days A FACE TO FACE EVALUATION WAS PERFORMED  Clide Deutscher Galadriel Shroff 04/28/2021, 12:17 PM

## 2021-04-28 NOTE — Progress Notes (Signed)
Occupational Therapy Session Note  Patient Details  Name: Carolyn Russo MRN: [id number] Date of Birth: Jun 18, 1951  Today's Date: 04/28/2021 OT Individual Time: 7837-5423 OT Individual Time Calculation (min): 54 min    Short Term Goals: Week 1:  OT Short Term Goal 1 (Week 1): Pt transfers to toilet wiht MIN A OT Short Term Goal 1 - Progress (Week 1): Met OT Short Term Goal 2 (Week 1): Pt will stand to groom at sink wiht MIN A OT Short Term Goal 2 - Progress (Week 1): Met OT Short Term Goal 3 (Week 1): Pt will don shirt wiht MIN A OT Short Term Goal 3 - Progress (Week 1): Met OT Short Term Goal 4 (Week 1): Pt will recall hemi dressing strategies wiht min VC OT Short Term Goal 4 - Progress (Week 1): Met Week 2:  OT Short Term Goal 2 (Week 2): STG=LTG 2/2 ELOS    Skilled Therapeutic Interventions/Progress Updates:    Pt greeted at time of session semireclined in bed talking on phone, given a few minutes at beginning of session to wrap up phone call. Pt on O2 at 2L via [place], stating that is not normal for her but she requested it last night. O2 sats remained 93-95% or higher throughout session on RA. Stand pivot bed > wheelchair CGA with RW and set up at sink for bathing UB with Supervision, assist with washing back/putting on lotion only and LB bathing with Min A for buttocks but pt able to reach around with RUE to fully reach buttocks but wanted therapist assist for thoroughness. UB dress Min A to fully pull down in the back, Min/CGA for pants and underwear as well as pt able to thread and stand to don over hips but saying she "cannot reach" fully in the back despite having good ROM to do so. Extended time for all tasks d/t slow movements and particular habits. Pt able to direct care. Set up in wheelchair alarm on call bell in reach, NT aware for Supervision for breakfast.    Therapy Documentation Precautions:  Precautions Precautions: Fall Restrictions Weight Bearing Restrictions:  No     Therapy/Group: Individual Therapy  [name] 04/28/2021, 7:09 AM

## 2021-04-28 NOTE — H&P (View-Only) (Signed)
   VASCULAR SURGERY ASSESSMENT & PLAN:   END-STAGE RENAL DISEASE: We have scheduled her for a for stage basilic vein transposition on Friday.  I have updated her husband.  If I think there is any chance of a basilic vein transposition to being successful then we will do a first stage basilic vein transposition.  If not we would consider placing an AV graft.  RENAL: If at all possible we we would like for her to get dialyzed on Thursday so dialysis does not interfere with her surgery on Friday.   SUBJECTIVE:   No specific complaints this morning.  PHYSICAL EXAM:   Vitals:   04/27/21 2022 04/27/21 2050 04/27/21 2101 04/28/21 0422  BP:  (!) 169/83  (!) 186/76  Pulse:  72  76  Resp:    18  Temp:  97.8 F (36.6 C)  98.5 F (36.9 C)  TempSrc:  Oral  Oral  SpO2: 100% 100% 100% 96%  Weight:    83 kg  Height:       Palpable right radial pulse  LABS:   Lab Results  Component Value Date   WBC 9.9 04/27/2021   HGB 9.6 (L) 04/27/2021   HCT 28.5 (L) 04/27/2021   MCV 86.4 04/27/2021   PLT 271 04/27/2021   Lab Results  Component Value Date   CREATININE 7.15 (H) 04/27/2021   Lab Results  Component Value Date   INR 1.0 04/14/2021    PROBLEM LIST:    Principal Problem:   Cerebrovascular accident (CVA) of right basal ganglia (HCC) Active Problems:   ESRD on dialysis ([place])   Bandemia   CURRENT MEDS:    arformoterol  15 mcg Nebulization BID   aspirin EC  81 mg Oral Daily   budesonide (PULMICORT) nebulizer solution  0.5 mg Nebulization BID   carvedilol  12.5 mg Oral BID WC   Chlorhexidine Gluconate Cloth  6 each Topical BID   darbepoetin (ARANESP) injection - DIALYSIS  100 mcg Intravenous Q Tue-HD   fluticasone  2 spray Each Nare Daily   furosemide  40 mg Oral Q M,W,F   hydrocortisone  25 mg Rectal BID   ketotifen  2 drop Both Eyes BID   lidocaine  1 patch Transdermal Q24H   losartan  25 mg Oral Daily   pantoprazole  40 mg Oral BID   rosuvastatin  20 mg Oral Daily    senna-docusate  2 tablet Oral BID   sevelamer carbonate  0.8 g Oral TID WC    Deitra Mayo Office: [phone] 04/28/2021

## 2021-04-28 NOTE — Plan of Care (Signed)
  Problem: RH Stairs Goal: LTG Patient will ambulate up and down stairs w/assist (PT) Description: LTG: Patient will ambulate up and down # of stairs with assistance (PT) Flowsheets (Taken 04/28/2021 1225) LTG: Pt will ambulate up/down stairs assist needed:: (upgrade due to progress) Supervision/Verbal cueing Note: Upgrade due to progress

## 2021-04-28 NOTE — Progress Notes (Signed)
   VASCULAR SURGERY ASSESSMENT & PLAN:   END-STAGE RENAL DISEASE: We have scheduled her for a for stage basilic vein transposition on Friday.  I have updated her husband.  If I think there is any chance of a basilic vein transposition to being successful then we will do a first stage basilic vein transposition.  If not we would consider placing an AV graft.  RENAL: If at all possible we we would like for her to get dialyzed on Thursday so dialysis does not interfere with her surgery on Friday.   SUBJECTIVE:   No specific complaints this morning.  PHYSICAL EXAM:   Vitals:   04/27/21 2022 04/27/21 2050 04/27/21 2101 04/28/21 0422  BP:  (!) 169/83  (!) 186/76  Pulse:  72  76  Resp:    18  Temp:  97.8 F (36.6 C)  98.5 F (36.9 C)  TempSrc:  Oral  Oral  SpO2: 100% 100% 100% 96%  Weight:    83 kg  Height:       Palpable right radial pulse  LABS:   Lab Results  Component Value Date   WBC 9.9 04/27/2021   HGB 9.6 (L) 04/27/2021   HCT 28.5 (L) 04/27/2021   MCV 86.4 04/27/2021   PLT 271 04/27/2021   Lab Results  Component Value Date   CREATININE 7.15 (H) 04/27/2021   Lab Results  Component Value Date   INR 1.0 04/14/2021    PROBLEM LIST:    Principal Problem:   Cerebrovascular accident (CVA) of right basal ganglia (HCC) Active Problems:   ESRD on dialysis ([place])   Bandemia   CURRENT MEDS:    arformoterol  15 mcg Nebulization BID   aspirin EC  81 mg Oral Daily   budesonide (PULMICORT) nebulizer solution  0.5 mg Nebulization BID   carvedilol  12.5 mg Oral BID WC   Chlorhexidine Gluconate Cloth  6 each Topical BID   darbepoetin (ARANESP) injection - DIALYSIS  100 mcg Intravenous Q Tue-HD   fluticasone  2 spray Each Nare Daily   furosemide  40 mg Oral Q M,W,F   hydrocortisone  25 mg Rectal BID   ketotifen  2 drop Both Eyes BID   lidocaine  1 patch Transdermal Q24H   losartan  25 mg Oral Daily   pantoprazole  40 mg Oral BID   rosuvastatin  20 mg Oral Daily    senna-docusate  2 tablet Oral BID   sevelamer carbonate  0.8 g Oral TID WC    Deitra Mayo Office: [phone] 04/28/2021

## 2021-04-29 ENCOUNTER — Inpatient Hospital Stay (HOSPITAL_COMMUNITY): Payer: Medicare Other

## 2021-04-29 ENCOUNTER — Telehealth: Payer: Medicare Other

## 2021-04-29 LAB — RENAL FUNCTION PANEL
Albumin: 3.1 g/dL — ABNORMAL LOW (ref 3.5–5.0)
Anion gap: 9 (ref 5–15)
BUN: 28 mg/dL — ABNORMAL HIGH (ref 8–23)
CO2: 26 mmol/L (ref 22–32)
Calcium: 8.6 mg/dL — ABNORMAL LOW (ref 8.9–10.3)
Chloride: 94 mmol/L — ABNORMAL LOW (ref 98–111)
Creatinine, Ser: 5.73 mg/dL — ABNORMAL HIGH (ref 0.44–1.00)
GFR, Estimated: 7 mL/min — ABNORMAL LOW (ref >60)
Glucose, Bld: 397 mg/dL — ABNORMAL HIGH (ref 70–99)
Phosphorus: 4.2 mg/dL (ref 2.5–4.6)
Potassium: 4.9 mmol/L (ref 3.5–5.1)
Sodium: 129 mmol/L — ABNORMAL LOW (ref 135–145)

## 2021-04-29 LAB — CBC
HCT: 29.5 % — ABNORMAL LOW (ref 36.0–46.0)
Hemoglobin: 10 g/dL — ABNORMAL LOW (ref 12.0–15.0)
MCH: 29.4 pg (ref 26.0–34.0)
MCHC: 33.9 g/dL (ref 30.0–36.0)
MCV: 86.8 fL (ref 80.0–100.0)
Platelets: 261 10*3/uL (ref 150–400)
RBC: 3.4 MIL/uL — ABNORMAL LOW (ref 3.87–5.11)
RDW: 15.1 % (ref 11.5–15.5)
WBC: 12.1 10*3/uL — ABNORMAL HIGH (ref 4.0–10.5)
nRBC: 0 % (ref 0.0–0.2)

## 2021-04-29 LAB — GLUCOSE, CAPILLARY
Glucose-Capillary: 346 mg/dL — ABNORMAL HIGH (ref 70–99)
Glucose-Capillary: 350 mg/dL — ABNORMAL HIGH (ref 70–99)

## 2021-04-29 IMAGING — DX DG CHEST 2V
2 series · 2 of 2 positions shown · non-contrast
Comparison: [DATE]

CLINICAL DATA: Pt experiencing SOB  CVA

EXAM:
CHEST - 2 VIEW

[chest ap]
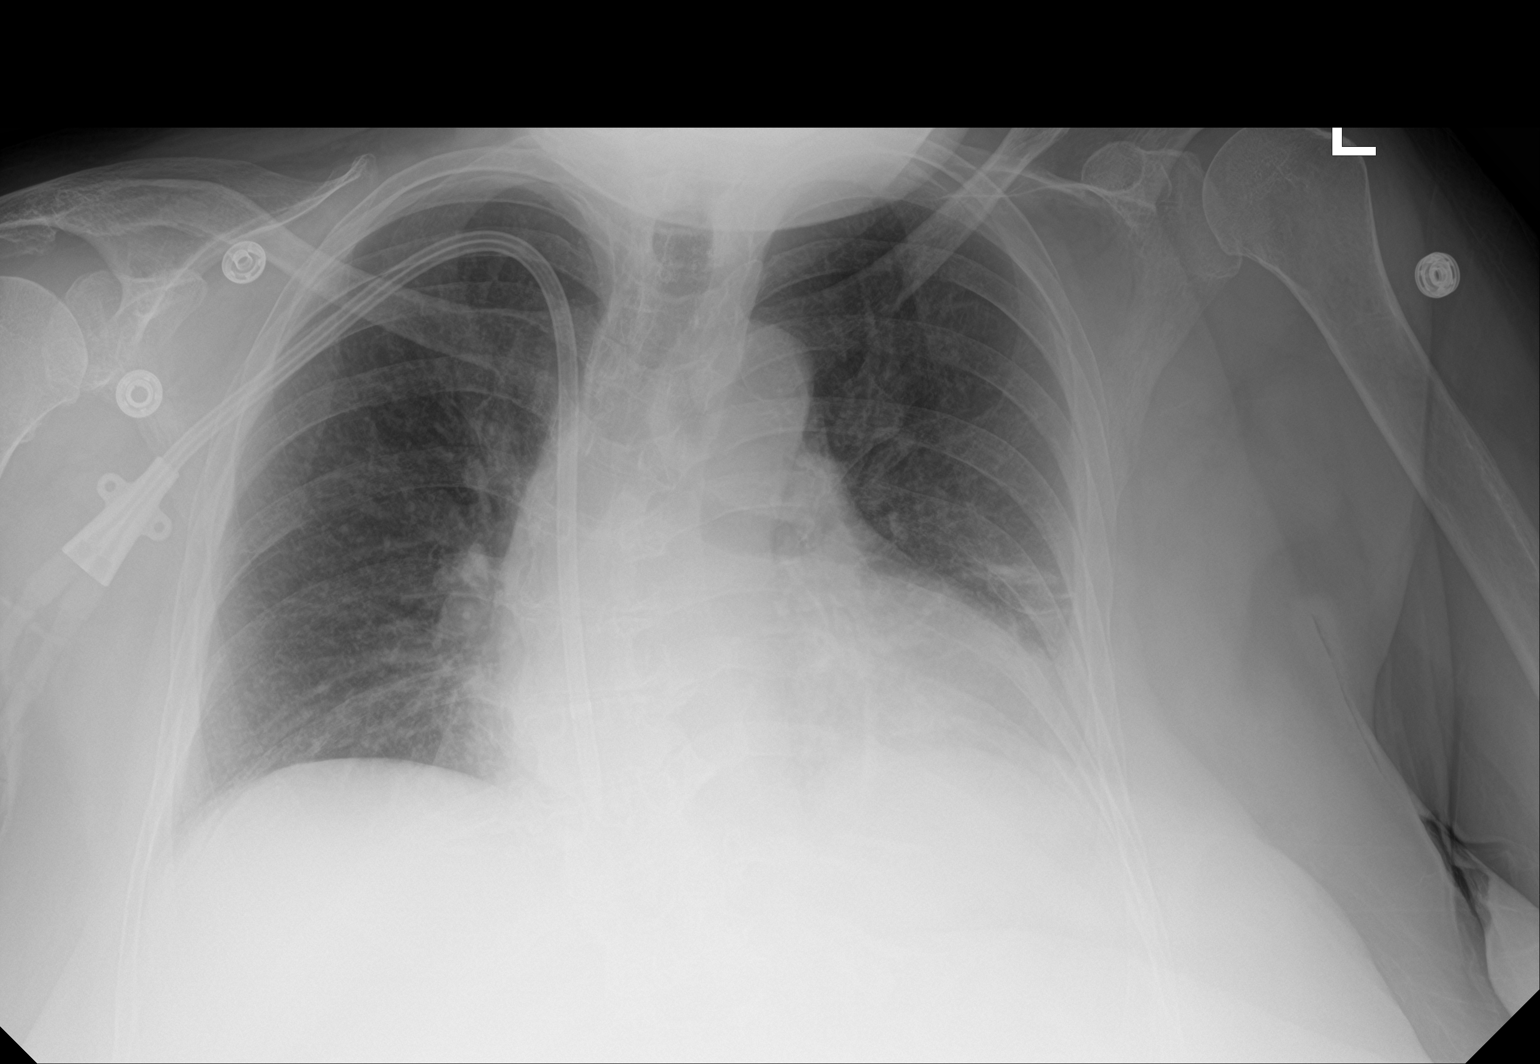

[chest lat]
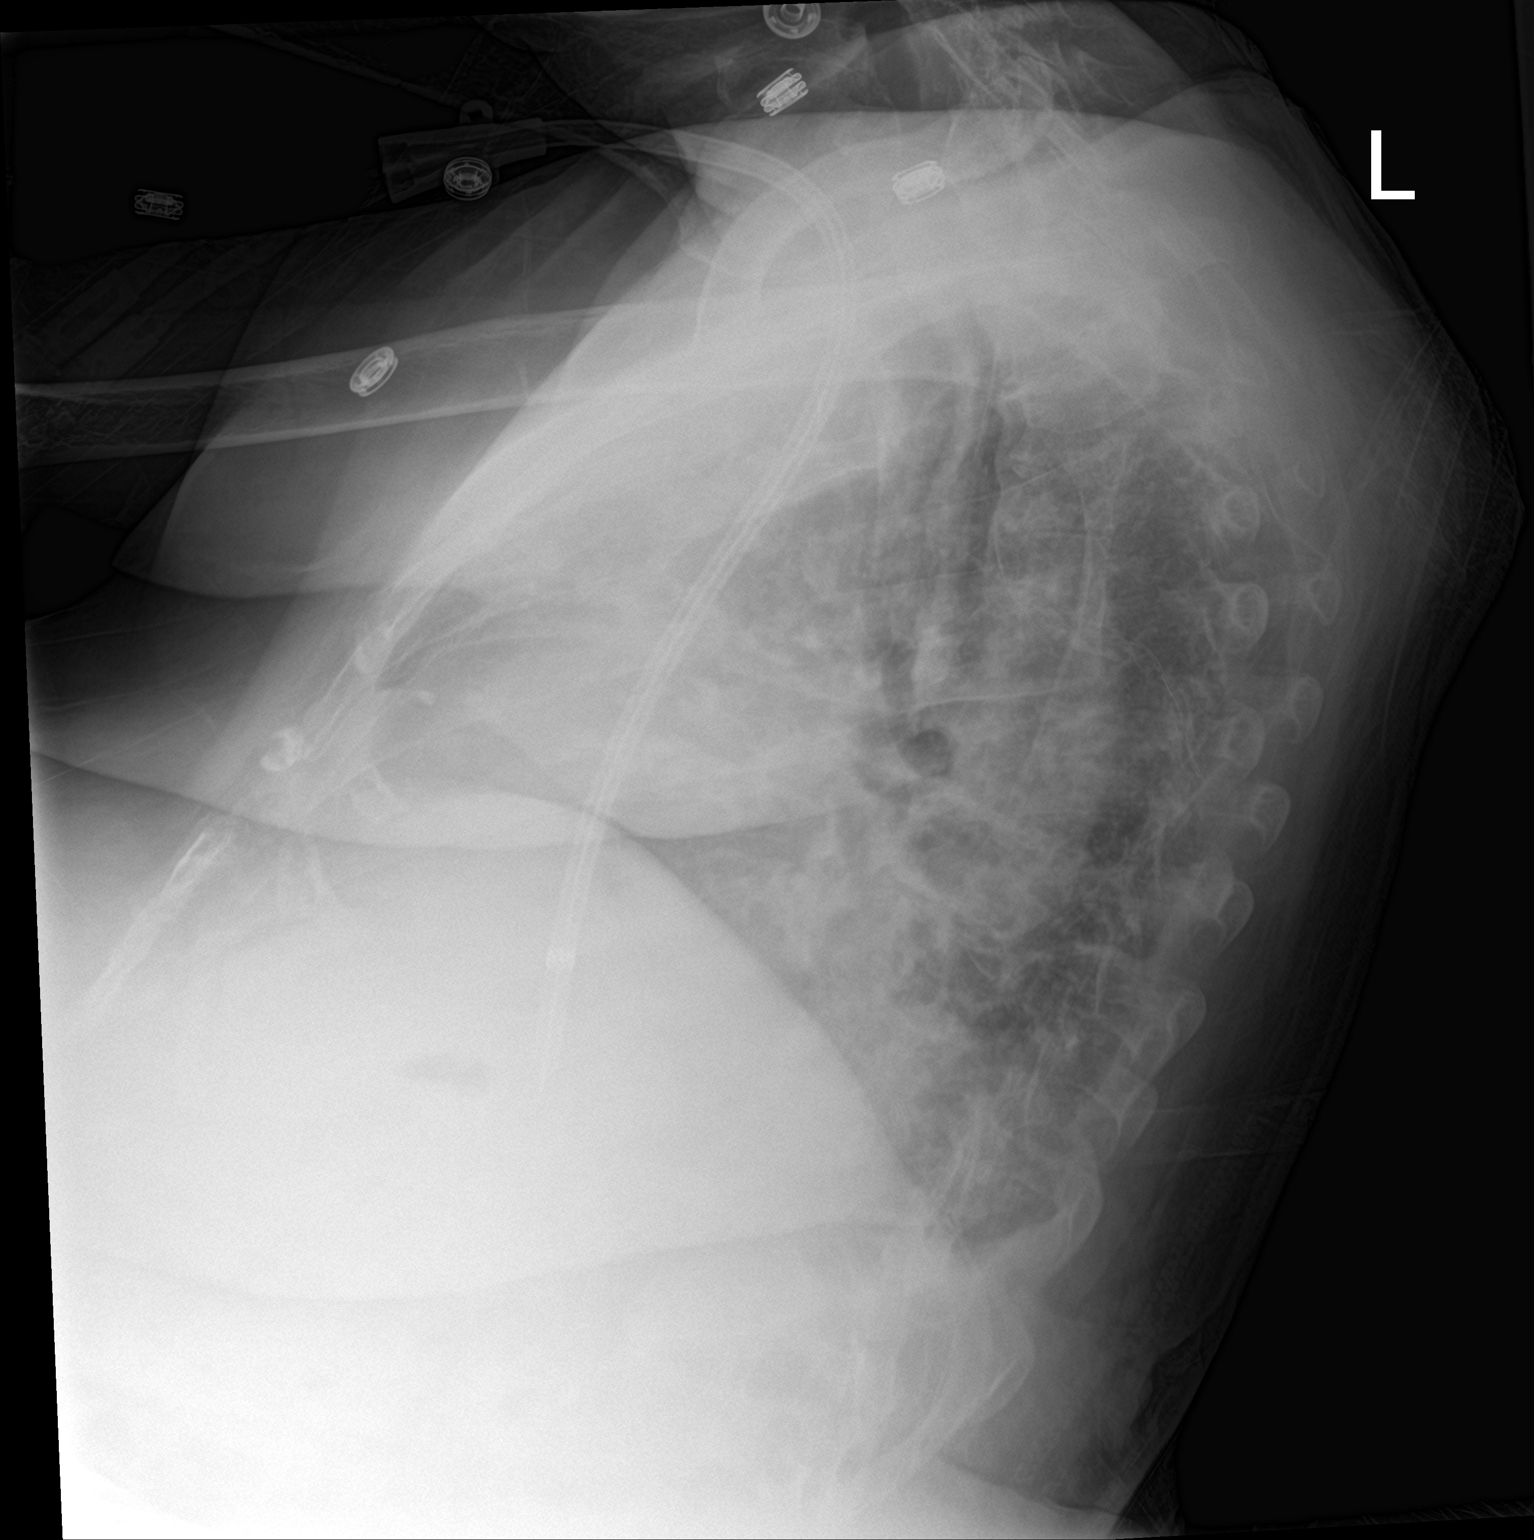

[2 of 2 positions shown; findings below may reference images not displayed]

FINDINGS: Relatively low lung volumes. Chronic linear scarring or subsegmental
atelectasis in the left infrahilar region and laterally in the left
mid lung.

Heart size upper limits normal. Tunneled right IJ hemodialysis
catheter extends to the mid right atrium as before.

No effusion.  No pneumothorax.

Thoracic dextroscoliosis.
IMPRESSION: Low lung volumes.  No acute findings.

## 2021-04-29 MED ORDER — HEPARIN SODIUM (PORCINE) 1000 UNIT/ML IJ SOLN
INTRAMUSCULAR | Status: AC
  Filled 2021-04-29: qty 1

## 2021-04-29 MED ORDER — INSULIN GLARGINE 100 UNIT/ML ~~LOC~~ SOLN
5.0000 [IU] | Freq: Two times a day (BID) | SUBCUTANEOUS | Status: DC
  Administered 2021-04-29 – 2021-04-30 (×3): 5 [IU] via SUBCUTANEOUS
  Filled 2021-04-29 (×5): qty 0.05

## 2021-04-29 MED ORDER — ALBUTEROL SULFATE (2.5 MG/3ML) 0.083% IN NEBU
INHALATION_SOLUTION | RESPIRATORY_TRACT | Status: AC
  Filled 2021-04-29: qty 3

## 2021-04-29 MED ORDER — DARBEPOETIN ALFA 100 MCG/0.5ML IJ SOSY
PREFILLED_SYRINGE | INTRAMUSCULAR | Status: AC
  Administered 2021-04-29: 100 ug via INTRAVENOUS
  Filled 2021-04-29: qty 0.5

## 2021-04-29 MED ORDER — LIDOCAINE 5 % EX PTCH
1.0000 | MEDICATED_PATCH | CUTANEOUS | Status: DC
  Administered 2021-04-29 – 2021-04-30 (×2): 1 via TRANSDERMAL
  Filled 2021-04-29: qty 1

## 2021-04-29 NOTE — Progress Notes (Signed)
Occupational Therapy Session Note  Patient Details  Name: Carolyn Russo MRN: [id number] Date of Birth: Feb 24, 1951  Today's Date: 04/29/2021 OT Individual Time: 1345-1410 OT Individual Time Calculation (min): 25 min  and Today's Date: 04/29/2021 OT Missed Time: 40 Minutes Missed Time Reason: Unavailable (comment) (transport came to take pt to HD)   Short Term Goals: Week 1:  OT Short Term Goal 1 (Week 1): Pt transfers to toilet wiht MIN A OT Short Term Goal 1 - Progress (Week 1): Met OT Short Term Goal 2 (Week 1): Pt will stand to groom at sink wiht MIN A OT Short Term Goal 2 - Progress (Week 1): Met OT Short Term Goal 3 (Week 1): Pt will don shirt wiht MIN A OT Short Term Goal 3 - Progress (Week 1): Met OT Short Term Goal 4 (Week 1): Pt will recall hemi dressing strategies wiht min VC OT Short Term Goal 4 - Progress (Week 1): Met  Skilled Therapeutic Interventions/Progress Updates:    Pt received in room in w/c handed off from SLP and consented to OT tx. Pt seen for toileting and functional transfer training. Pt req min A to stand with RW and sit EOB. Pt then requested to use restroom, walked into bathroom with RW and min A and completed toileting tasks with CGA. Pt then walked back into room and washed hands standing sink side with CGA. Transport present and reported pt needed to go down to dialysis. Confirmed pt needed to go to HD as she normally goes MWF, transport confirmed as pt missed some dialysis time during appt yesterday. Pt helped back to bed with cuing for safety and CGA. Currently missing 40 mins of OT. Handed off to HD transport.   Therapy Documentation Precautions:  Precautions Precautions: Fall Restrictions Weight Bearing Restrictions: No General:   Vital Signs: Therapy Vitals Pulse Rate: 72 Resp: 16 Patient Position (if appropriate): Lying Oxygen Therapy SpO2: 97 % O2 Device: Room Air Pain: Pain Assessment Pain Scale: 0-10 Pain Score: 0-No  pain    Therapy/Group: Individual Therapy  Allison Deshotels 04/29/2021, 11:35 AM

## 2021-04-29 NOTE — Progress Notes (Signed)
Patient requested treatment end early due to abdominal pain. Alerted floor RN that patient requests an enema.

## 2021-04-29 NOTE — Progress Notes (Signed)
Speech Language Pathology Weekly Progress and Session Note  Patient Details  Name: Carolyn Russo MRN: [id number] Date of Birth: 1951/04/30  Beginning of progress report period: 04/21/2021 End of progress report period: 04/29/2021  Today's Date: 04/29/2021 SLP Individual Time: 1300-1345 SLP Individual Time Calculation (min): 45 min  Short Term Goals: Week 1: SLP Short Term Goal 1 (Week 1): Patient will maintain sustained attention for 5 minute increments during a functional task with Max A multimodal cues. SLP Short Term Goal 1 - Progress (Week 1): Met SLP Short Term Goal 2 (Week 1): Patient will attend to left field of enviornment during functional tasks with Mod A verbal cues. SLP Short Term Goal 2 - Progress (Week 1): Met SLP Short Term Goal 3 (Week 1): Patient will initiate verbal responses in 75% of opportunities with Max A multimodal cues. SLP Short Term Goal 3 - Progress (Week 1): Met SLP Short Term Goal 4 (Week 1): Patient will consume current diet with minimal overt s/s of aspiration with Mod verbal cues for use of swallowing compensastory strategies. SLP Short Term Goal 4 - Progress (Week 1): Met SLP Short Term Goal 5 (Week 1): Patient will demosntrate efficient mastication and oral clearance with trials of Dys. 3 textures over 2 sessions prior to upgrade with Mod verbal cues. SLP Short Term Goal 5 - Progress (Week 1): Progressing toward goal SLP Short Term Goal 6 (Week 1): Patient will utilize speech intelligibility strategies at the sentence level with Mod verbal cues. SLP Short Term Goal 6 - Progress (Week 1): Met    New Short Term Goals: Week 2: SLP Short Term Goal 1 (Week 2): STG=LTG  Weekly Progress Updates:  Patient made good progress on conservatively set goals for speech, cognition and swallow function. She continues with lethargy which has impacted her ability to be safely upgraded from Dys 2 to Dys 3 solids. In addition, lethargy has been impacting her cognitive  function and ability to demonstrate consistency in performance with all tasks.    Intensity: Minumum of 1-2 x/day, 30 to 90 minutes Frequency: 3 to 5 out of 7 days Duration/Length of Stay: 7/14 Treatment/Interventions: Cognitive remediation/compensation;Dysphagia/aspiration precaution training;Internal/external aids;Speech/Language facilitation;Therapeutic Activities;Environmental controls;Cueing hierarchy;Functional tasks;Patient/family education   Daily Session  Skilled Therapeutic Interventions: Patient seen for skilled ST session focusing on cognitive and speech goals. Of note, patient very alert and only started to become lethargic during very end of session. She has reported (confirmed in chart) that she has been leaving dialysis early because of c/o pain in abdomen. Patient's speech was intelligible at oral reading of page long story as well as in basic level conversation, with main errors being imprecise lingual consonants. Patient was oriented to time, place, but continues with difficulty in recalling therapist's names, roles and requires modA cues to demonstrate recall of specific tasks/strategies/exercises she has been taught.      General    Pain Pain Assessment Pain Scale: 0-10 Pain Score: 8  Pain Type: Acute pain Pain Location: Abdomen  Therapy/Group: Individual Therapy  Sonia Baller, MA, CCC-SLP Speech Therapy

## 2021-04-29 NOTE — Progress Notes (Signed)
[facility] Progress Note   Subjective:   Patient states she feels well today.  Chest pain slightly better.  Planning for dialysis  Objective Vitals:   04/28/21 2029 04/28/21 2107 04/29/21 0513 04/29/21 0839  BP:  (!) 165/73 (!) 162/73   Pulse:  71 72 72  Resp:  18  16  Temp:  98.4 F (36.9 C) 97.8 F (36.6 C)   TempSrc:  Oral Oral   SpO2: 94% 97% 96% 97%  Weight:   83.4 kg   Height:       Physical Exam General:well appearing female in NAD Heart:normal rate, no murmurs Lungs:bilateral chest rise with no iwob Abdomen:soft, NTND Extremities:no LE edema Dialysis Access: [facility]   Filed Weights   04/27/21 1452 04/28/21 0422 04/29/21 0513  Weight: 82.6 kg 83 kg 83.4 kg    Intake/Output Summary (Last 24 hours) at 04/29/2021 0948 Last data filed at 04/28/2021 2100 Gross per 24 hour  Intake 480 ml  Output 200 ml  Net 280 ml    Additional Objective Labs: Basic Metabolic Panel: Recent Labs  Lab 04/23/21 0436 04/27/21 1017  NA 134* 130*  K 3.6 4.9  CL 96* 96*  CO2 30 25  GLUCOSE 262* 329*  BUN 14 41*  CREATININE 5.18* 7.15*  CALCIUM 8.9 9.2  PHOS 4.4 4.5   Liver Function Tests: Recent Labs  Lab 04/23/21 0436 04/27/21 1017  ALBUMIN 3.2* 3.1*    CBC: Recent Labs  Lab 04/23/21 0436 04/27/21 1017  WBC 11.5* 9.9  NEUTROABS 6.1  --   HGB 10.0* 9.6*  HCT 30.0* 28.5*  MCV 90.4 86.4  PLT 268 271    CBG: No results for input(s): GLUCAP in the last 168 hours.   Medications:  [START ON 04/30/2021]  ceFAZolin (ANCEF) IV      arformoterol  15 mcg Nebulization BID   aspirin EC  81 mg Oral Daily   budesonide (PULMICORT) nebulizer solution  0.5 mg Nebulization BID   carvedilol  12.5 mg Oral BID WC   Chlorhexidine Gluconate Cloth  6 each Topical BID   darbepoetin (ARANESP) injection - DIALYSIS  100 mcg Intravenous Q Thu-HD   [START ON 05/07/2021] darbepoetin (ARANESP) injection - DIALYSIS  100 mcg Intravenous Q Fri-HD   fluticasone  2 spray Each  Nare Daily   furosemide  40 mg Oral Q M,W,F   hydrocortisone  25 mg Rectal BID   insulin glargine  5 Units Subcutaneous BID   ketotifen  2 drop Both Eyes BID   lidocaine  1 patch Transdermal Q24H   lidocaine  1 patch Transdermal Q24H   losartan  25 mg Oral Daily   pantoprazole  40 mg Oral BID   rosuvastatin  20 mg Oral Daily   senna-docusate  2 tablet Oral BID   sevelamer carbonate  0.8 g Oral TID WC    Dialysis Orders: [facility]-MWF 4 hrs 180NRe 400/Autoflow 1.5 86.5 kg 2.0K/2.0 Ca TDC -heparin 2000 units IV TIW -Mircera 100 mcg IV q 2 weeks (last dose 04/14/2021 -Hectorol 5 mcg IV TIW   Assessment/Plan: Acute subcortical R CVA-per Primary/Neurology.  In CIR. Acute LGIB- Hx hemorrhoidal bleeding in past. new onset 06/25 post clopidogrel load. Hbg drop 9>7.2, improved to 10.0 s/p 2 units PRBCS on 04/18/21. Clopidogrel and ASA on hold. No heparin with HD. Per GI: EGD and flex sig with no etiology and bleeding scan (-). No additional bleeding per patient.  ESRD -  MWF.  Off heparin d/t new GIB.  Continue 3-4.0 K bath. Got off schedule. Plan for HD TTS this week given surgery on Friday then resume MWF next week  Hypertension/volume  -BP has been high. Below EDW at 80-82kg. Challenging weight with dialysis today Anemia - Hgb 9.6. As noted in # 2. aranesp 12mcg dosed on 7/5 and switch back to Wednesdays when she is back on schedule. Transfuse PRN. Metabolic bone disease -  Ca in goal.  PTH only 110 03/26/21 on 5 mcg Hectorol, hectorol was held at that time.  Rechecked pth 128. No need to restart VDRA. Last PO4 at goal. Changed renvela to powder d/t patient inability to swallow pills.  Nutrition - Now on dysphagia diet.  DTM2- per primary Pleuritic abdominal/chest pain: improved today HD access: Has TDC. basilic vein transposition Friday. Appreciate help from Dr. Scot Dock.  [facility] 04/29/2021,9:48 AM  LOS: 9 days

## 2021-04-29 NOTE — Progress Notes (Signed)
Physical Therapy Session Note  Patient Details  Name: [name] MRN: [id number] Date of Birth: August 12, 1951  Today's Date: 04/29/2021 PT Individual Time: 1015-1115 PT Individual Time Calculation (min): 60 min   Short Term Goals: Week 1:  PT Short Term Goal 1 (Week 1): Pt will complete least restrictive transfers with min A PT Short Term Goal 1 - Progress (Week 1): Met PT Short Term Goal 2 (Week 1): Pt will initiate stair training PT Short Term Goal 2 - Progress (Week 1): Met PT Short Term Goal 3 (Week 1): Pt will ambulate x 50 ft with LRAD and min A PT Short Term Goal 3 - Progress (Week 1): Met PT Short Term Goal 4 (Week 1): Pt will tolerate sitting OOB in chair x 1 hour PT Short Term Goal 4 - Progress (Week 1): Met Week 2:  PT Short Term Goal 1 (Week 2): =LTG due to ELOS Week 3:     Skilled Therapeutic Interventions/Progress Updates:   Pain:  Pt reports L shoulder pain.  Treatment to tolerance.  Rest breaks and repositioning as needed.  Pt initially sleeping and very slow to awaken.  Requests assist to bathroom and w/am ADLs.  Supine to sit w/additional time and min assist, pt w/eyes closed during this, gradually becomes more awake.  Shoes donned in sitting w/set up and use of shoe horn, therapist ties shoes due to urgent need for urination. Sit to stand w/min assist from low bed to RW.  Gait 65f and commode transfer w/cga, cues for safety, min assist to manage brief. Pt incontent in brief of urine, continent of urine on commode and performs hygiene in sitting w/supervision. Sit to stand from commode w/cga, mod assist to manage brief, gait [id number] wc w/cga, cues for stayin within walker w/L foot/navigating obstacles on L w/walker. Pt then requested assistance w/bathing at sink.  Transported to sink. From wc level perfoms upper body undressing and bathing w/set up and min assist, total assist for back and application of lotion to back. Sit to stand from wc w/cga and performs washing  of perineal area and buttocks w/min assist to complete and min assist for balance. In sitting pt able to don depends brief and pants to hips w/min assist, completes in standing w/mod assist, cga for balance w/RW. Pt returns to sink and brushes teeth w/set up from wc level.  Sit to stand from wc w/cga. Gait 7535f/RW, cga, cues to attend to L environment and L hemibody positioning within walker. Turn/sit to wc w/moderate cues for safety/to complete turning/backing prior to sitting.  Pt left oob in wc w/alarm belt set and needs in reach  Therapy Documentation Precautions:  Precautions Precautions: Fall Restrictions Weight Bearing Restrictions: No   Therapy/Group: Individual Therapy BarCallie FieldingT [place]7/2022, 12:24 PM

## 2021-04-29 NOTE — Progress Notes (Addendum)
PROGRESS NOTE   Subjective/Complaints: Shortness of breath last night C/o left shoulder pain Discussed obtaining CXR today, applying lidocaine patch to left shoulder, benefits of using incentive spirometer- ordered  ROS: Patient denies fever, rash, sore throat, blurred vision, nausea, vomiting, diarrhea, cough, joint or back pain, headache, or mood change. +abdominal pain, +left sided chest pain, +left shoulder pain   Objective:   No results found. Recent Labs    04/27/21 1017  WBC 9.9  HGB 9.6*  HCT 28.5*  PLT 271    Recent Labs    04/27/21 1017  NA 130*  K 4.9  CL 96*  CO2 25  GLUCOSE 329*  BUN 41*  CREATININE 7.15*  CALCIUM 9.2      Intake/Output Summary (Last 24 hours) at 04/29/2021 0830 Last data filed at 04/28/2021 2100 Gross per 24 hour  Intake 960 ml  Output 200 ml  Net 760 ml        Physical Exam: Vital Signs Blood pressure (!) 162/73, pulse 72, temperature 97.8 F (36.6 C), temperature source Oral, resp. rate 18, height 5\' 2"  (1.575 m), weight 83.4 kg, SpO2 96 %. Gen: no distress, normal appearing HEENT: oral mucosa pink and moist, NCAT Cardio: Reg rate Chest: normal effort, normal rate of breathing Abd: soft, non-distended Ext: no edema Psych: pleasant, flat affect Musculoskeletal:        General: No swelling.    Cervical back: Normal range of motion.    Comments: Mild tenderness left shoulder with ER/IR today  Skin:    General: Skin is warm and dry. Neurological:    Comments: alert, follows commands.   Fair insight and awareness. Provided biographical information. Normal language, speech slightly slurred. Mild left central 7. LUE 2 to 2+/5 deltoid, 3/5 biceps, triceps and 3+/5 wrist and HI. LLE 3-4/5 prox to distal. RUE and RLE 4-5/5 prox to distal. No sensory deficits noted--motor/sensory exam stable today.   Assessment/Plan: 1. Functional deficits which require 3+ hours per day of  interdisciplinary therapy in a comprehensive inpatient rehab setting. Physiatrist is providing close team supervision and 24 hour management of active medical problems listed below. Physiatrist and rehab team continue to assess barriers to discharge/monitor patient progress toward functional and medical goals  Care Tool:  Bathing    Body parts bathed by patient: Right arm, Left arm, Chest, Abdomen, Front perineal area, Right upper leg, Left upper leg   Body parts bathed by helper: Right lower leg, Left lower leg, Buttocks     Bathing assist Assist Level: Minimal Assistance - Patient > 75%     Upper Body Dressing/Undressing Upper body dressing   What is the patient wearing?: Pull over shirt    Upper body assist Assist Level: Minimal Assistance - Patient > 75%    Lower Body Dressing/Undressing Lower body dressing      What is the patient wearing?: Pants, Incontinence brief     Lower body assist Assist for lower body dressing: Minimal Assistance - Patient > 75%     Toileting Toileting    Toileting assist Assist for toileting: Minimal Assistance - Patient > 75%     Transfers Chair/bed transfer  Transfers assist  Chair/bed transfer assist level: Contact Guard/Touching assist     Locomotion Ambulation   Ambulation assist      Assist level: Contact Guard/Touching assist Assistive device: Walker-rolling Max distance: 115'   Walk 10 feet activity   Assist     Assist level: Contact Guard/Touching assist Assistive device: Walker-rolling   Walk 50 feet activity   Assist Walk 50 feet with 2 turns activity did not occur: Safety/medical concerns  Assist level: Contact Guard/Touching assist Assistive device: Walker-rolling    Walk 150 feet activity   Assist Walk 150 feet activity did not occur: Safety/medical concerns         Walk 10 feet on uneven surface  activity   Assist Walk 10 feet on uneven surfaces activity did not occur:  Safety/medical concerns         Wheelchair     Assist Will patient use wheelchair at discharge?:  (TBD) Type of Wheelchair: Manual    Wheelchair assist level: Dependent - Patient 0% Max wheelchair distance: 150'    Wheelchair 50 feet with 2 turns activity    Assist        Assist Level: Dependent - Patient 0%   Wheelchair 150 feet activity     Assist      Assist Level: Dependent - Patient 0%   Blood pressure (!) 162/73, pulse 72, temperature 97.8 F (36.6 C), temperature source Oral, resp. rate 18, height 5\' 2"  (1.575 m), weight 83.4 kg, SpO2 96 %.  Medical Problem List and Plan: 1.  Left side weakness secondary to nonhemorrhagic right basal ganglia infarction             -patient may shower             -ELOS/Goals: 10-12 days, mod I goals for PT, OT, SLP  -Continue CIR therapies including PT, OT, and SLP   2.  Impaired mobility: -DVT/anticoagulation:  continue SCD             -antiplatelet therapy:ASA  7/1- allowed to restart Plavix- can add Plavix in 5-7 days if not bleeding (7/7) 3. Right hip bursitis: continue Tramadol 50 mg every 12 hours as needed, ice, ROM, lidocaine patch 4. Mood: Xanax 0.5 mg 3 times daily as needed.  Provide emotional support             -antipsychotic agents: N/A 5. Neuropsych: This patient is capable of making decisions on her own behalf. 6. Skin/Wound Care: Routine skin checks 7. Fluids/Electrolytes/Nutrition: Routine in and outs with follow-up chemistries 8.  Acute lower GI bleed with blood loss anemia.  Follow-up GI services.  Resume ASA as above. - Continue Protonix 40 mg twice daily 7/6: hgb 9.6--monitor for gross bleeding 9.  Chronic diastolic congestive heart failure.  Monitor for any signs of fluid overload.  Continue Lasix 40-80 mg Monday Wednesday Friday             -daily weights, have been stable 10.  Newly Dx'd End-stage renal disease.  Hemodialysis as per renal services 11.  Hypertension.  Increase Coreg to  12.5 mg twice daily. Aggressive filtration with ultradialysis tomorrow 12.  Asthma.  Continue inhalers as directed  - was getting Home O2 prior to admission- will likely need to go home on 2L O2-   Was trying to get at home, but hadn't yet.   7/1- con't nebs prn, home O2 ?PRN only  7/3 lungs very clear today, sats 99% on RA. Some of SOB may be behaviorally related. Reassured pt  today that lungs sounded fine.   -encouraged FV/IS, OOB  7/7: ordered CXT, ordered incentive spirometer 13.Hyperlipidemia.Crestor 14.Obesity.BMI 34.31.Dietery follow up 15.  Diabetes mellitus.hemoglobin AIc 5.2.  CBG checks restarted. Restart Lantus 5U BID.   16. Leukocytosis: resolved 17. Constipation: KUB shows moderate stool burden. Increase senna-docusate to BID.   18. Fistula placement: scheduled for Friday. Discussed with patient 32. Left shoulder pain: ordered lidocaine patch    LOS: 9 days A FACE TO FACE EVALUATION WAS PERFORMED  Lilybeth Vien P Cambrie Sonnenfeld 04/29/2021, 8:30 AM

## 2021-04-30 ENCOUNTER — Inpatient Hospital Stay (HOSPITAL_COMMUNITY): Payer: Medicare Other | Admitting: Anesthesiology

## 2021-04-30 ENCOUNTER — Encounter (HOSPITAL_COMMUNITY)
Admission: RE | Disposition: A | Discharge status: Other Acute Inpatient Hospital | Payer: Self-pay | Source: Intra-hospital | Attending: Physical Medicine and Rehabilitation

## 2021-04-30 ENCOUNTER — Inpatient Hospital Stay (HOSPITAL_COMMUNITY): Admission: RE | Admit: 2021-04-30 | Payer: Medicare Other | Source: Home / Self Care | Admitting: Vascular Surgery

## 2021-04-30 ENCOUNTER — Encounter (HOSPITAL_COMMUNITY): Payer: Self-pay | Admitting: Physical Medicine and Rehabilitation

## 2021-04-30 DIAGNOSIS — N185 Chronic kidney disease, stage 5: Secondary | ICD-10-CM

## 2021-04-30 HISTORY — DX: Chronic kidney disease, unspecified: N18.9

## 2021-04-30 HISTORY — DX: Anemia, unspecified: D64.9

## 2021-04-30 HISTORY — PX: BASCILIC VEIN TRANSPOSITION: SHX5742

## 2021-04-30 HISTORY — DX: Heart failure, unspecified: I50.9

## 2021-04-30 LAB — BASIC METABOLIC PANEL
Anion gap: 9 (ref 5–15)
BUN: 21 mg/dL (ref 8–23)
CO2: 25 mmol/L (ref 22–32)
Calcium: 8.9 mg/dL (ref 8.9–10.3)
Chloride: 97 mmol/L — ABNORMAL LOW (ref 98–111)
Creatinine, Ser: 4.82 mg/dL — ABNORMAL HIGH (ref 0.44–1.00)
GFR, Estimated: 9 mL/min — ABNORMAL LOW (ref >60)
Glucose, Bld: 287 mg/dL — ABNORMAL HIGH (ref 70–99)
Potassium: 4.4 mmol/L (ref 3.5–5.1)
Sodium: 131 mmol/L — ABNORMAL LOW (ref 135–145)

## 2021-04-30 LAB — GLUCOSE, CAPILLARY
Glucose-Capillary: 306 mg/dL — ABNORMAL HIGH (ref 70–99)
Glucose-Capillary: 257 mg/dL — ABNORMAL HIGH (ref 70–99)
Glucose-Capillary: 310 mg/dL — ABNORMAL HIGH (ref 70–99)
Glucose-Capillary: 287 mg/dL — ABNORMAL HIGH (ref 70–99)
Glucose-Capillary: 304 mg/dL — ABNORMAL HIGH (ref 70–99)

## 2021-04-30 LAB — CBC
HCT: 28.3 % — ABNORMAL LOW (ref 36.0–46.0)
Hemoglobin: 9.9 g/dL — ABNORMAL LOW (ref 12.0–15.0)
MCH: 29.5 pg (ref 26.0–34.0)
MCHC: 35 g/dL (ref 30.0–36.0)
MCV: 84.2 fL (ref 80.0–100.0)
Platelets: 254 10*3/uL (ref 150–400)
RBC: 3.36 MIL/uL — ABNORMAL LOW (ref 3.87–5.11)
RDW: 15 % (ref 11.5–15.5)
WBC: 14.4 10*3/uL — ABNORMAL HIGH (ref 4.0–10.5)
nRBC: 0 % (ref 0.0–0.2)

## 2021-04-30 SURGERY — TRANSPOSITION, VEIN, BASILIC
Anesthesia: Monitor Anesthesia Care | Site: Arm Upper | Laterality: Right

## 2021-04-30 MED ORDER — ACETAMINOPHEN 160 MG/5ML PO SOLN: 1000.0000 mg | Freq: Once | ORAL | Status: DC | PRN

## 2021-04-30 MED ORDER — LACTATED RINGERS IV SOLN: INTRAVENOUS | Status: DC

## 2021-04-30 MED ORDER — LIDOCAINE-EPINEPHRINE (PF) 1.5 %-1:200000 IJ SOLN
INTRAMUSCULAR | Status: DC | PRN
  Administered 2021-04-30: 25 mL via PERINEURAL

## 2021-04-30 MED ORDER — LIDOCAINE-EPINEPHRINE (PF) 1 %-1:200000 IJ SOLN
INTRAMUSCULAR | Status: DC | PRN
  Administered 2021-04-30: 10 mL

## 2021-04-30 MED ORDER — MEPIVACAINE HCL (PF) 2 % IJ SOLN
INTRAMUSCULAR | Status: DC | PRN
  Administered 2021-04-30: 10 mL

## 2021-04-30 MED ORDER — OXYCODONE HCL 5 MG PO TABS: 5.0000 mg | ORAL_TABLET | Freq: Once | ORAL | Status: DC | PRN

## 2021-04-30 MED ORDER — PROTAMINE SULFATE 10 MG/ML IV SOLN
INTRAVENOUS | Status: DC | PRN
  Administered 2021-04-30: 40 mg via INTRAVENOUS

## 2021-04-30 MED ORDER — INSULIN ASPART 100 UNIT/ML IJ SOLN
INTRAMUSCULAR | Status: AC
  Filled 2021-04-30: qty 1

## 2021-04-30 MED ORDER — DEXMEDETOMIDINE HCL IN NACL 400 MCG/100ML IV SOLN
0.4000 ug/kg/h | INTRAVENOUS | Status: AC
  Administered 2021-04-30: .7 ug/kg/h via INTRAVENOUS
  Filled 2021-04-30: qty 100

## 2021-04-30 MED ORDER — SODIUM CHLORIDE 0.9 % IV SOLN: INTRAVENOUS | Status: DC | PRN

## 2021-04-30 MED ORDER — CHLORHEXIDINE GLUCONATE 0.12 % MT SOLN
15.0000 mL | Freq: Once | OROMUCOSAL | Status: AC
  Administered 2021-04-30: 15 mL via OROMUCOSAL

## 2021-04-30 MED ORDER — CHLORHEXIDINE GLUCONATE CLOTH 2 % EX PADS
6.0000 | MEDICATED_PAD | Freq: Every day | CUTANEOUS | Status: DC
  Administered 2021-05-01 – 2021-05-02 (×2): 6 via TOPICAL

## 2021-04-30 MED ORDER — HEPARIN 6000 UNIT IRRIGATION SOLUTION
Status: DC | PRN
  Administered 2021-04-30: 1

## 2021-04-30 MED ORDER — INSULIN GLARGINE 100 UNIT/ML ~~LOC~~ SOLN
7.0000 [IU] | Freq: Two times a day (BID) | SUBCUTANEOUS | Status: DC
  Administered 2021-04-30 – 2021-05-01 (×2): 7 [IU] via SUBCUTANEOUS
  Filled 2021-04-30 (×3): qty 0.07

## 2021-04-30 MED ORDER — ORAL CARE MOUTH RINSE: 15.0000 mL | Freq: Once | OROMUCOSAL | Status: AC

## 2021-04-30 MED ORDER — FENTANYL CITRATE (PF) 100 MCG/2ML IJ SOLN
25.0000 ug | Freq: Once | INTRAMUSCULAR | Status: AC
  Administered 2021-04-30: 25 ug via INTRAVENOUS

## 2021-04-30 MED ORDER — HEPARIN SODIUM (PORCINE) 1000 UNIT/ML IJ SOLN
INTRAMUSCULAR | Status: DC | PRN
  Administered 2021-04-30: 8000 [IU] via INTRAVENOUS

## 2021-04-30 MED ORDER — FENTANYL CITRATE (PF) 100 MCG/2ML IJ SOLN: 25.0000 ug | INTRAMUSCULAR | Status: DC | PRN

## 2021-04-30 MED ORDER — ACETAMINOPHEN 10 MG/ML IV SOLN: 1000.0000 mg | Freq: Once | INTRAVENOUS | Status: DC | PRN

## 2021-04-30 MED ORDER — INSULIN ASPART 100 UNIT/ML IJ SOLN
8.0000 [IU] | INTRAMUSCULAR | Status: AC
  Administered 2021-04-30: 8 [IU] via SUBCUTANEOUS

## 2021-04-30 MED ORDER — 0.9 % SODIUM CHLORIDE (POUR BTL) OPTIME
TOPICAL | Status: DC | PRN
  Administered 2021-04-30: 1000 mL

## 2021-04-30 MED ORDER — CEFAZOLIN SODIUM-DEXTROSE 2-4 GM/100ML-% IV SOLN
INTRAVENOUS | Status: AC
  Filled 2021-04-30: qty 100

## 2021-04-30 MED ORDER — SODIUM CHLORIDE 0.9 % IV SOLN: INTRAVENOUS | Status: DC

## 2021-04-30 MED ORDER — ACETAMINOPHEN 500 MG PO TABS: 1000.0000 mg | ORAL_TABLET | Freq: Once | ORAL | Status: DC | PRN

## 2021-04-30 MED ORDER — OXYCODONE HCL 5 MG/5ML PO SOLN: 5.0000 mg | Freq: Once | ORAL | Status: DC | PRN

## 2021-04-30 SURGICAL SUPPLY — 31 items
ADH SKN CLS APL DERMABOND .7 (GAUZE/BANDAGES/DRESSINGS) ×1
ARMBAND PINK RESTRICT EXTREMIT (MISCELLANEOUS) ×2 IMPLANT
CANISTER SUCT 3000ML PPV (MISCELLANEOUS) ×2 IMPLANT
CANNULA VESSEL 3MM 2 BLNT TIP (CANNULA) ×2 IMPLANT
CLIP VESOCCLUDE MED 24/CT (CLIP) ×2 IMPLANT
CLIP VESOCCLUDE SM WIDE 24/CT (CLIP) ×2 IMPLANT
COVER PROBE W GEL 5X96 (DRAPES) ×1 IMPLANT
DECANTER SPIKE VIAL GLASS SM (MISCELLANEOUS) ×2 IMPLANT
DERMABOND ADVANCED (GAUZE/BANDAGES/DRESSINGS) ×1
DERMABOND ADVANCED .7 DNX12 (GAUZE/BANDAGES/DRESSINGS) ×1 IMPLANT
ELECT REM PT RETURN 9FT ADLT (ELECTROSURGICAL) ×2 IMPLANT
ELECTRODE REM PT RTRN 9FT ADLT (ELECTROSURGICAL) ×1 IMPLANT
GLOVE SRG 8 PF TXTR STRL LF DI (GLOVE) ×1 IMPLANT
GLOVE SURG ENC MOIS LTX SZ7.5 (GLOVE) ×2 IMPLANT
GLOVE SURG UNDER POLY LF SZ8 (GLOVE) ×2
GOWN STRL REUS W/ TWL LRG LVL3 (GOWN DISPOSABLE) ×3 IMPLANT
GOWN STRL REUS W/TWL LRG LVL3 (GOWN DISPOSABLE) ×6
KIT BASIN OR (CUSTOM PROCEDURE TRAY) ×2 IMPLANT
KIT TURNOVER KIT B (KITS) ×2 IMPLANT
NS IRRIG 1000ML POUR BTL (IV SOLUTION) ×2 IMPLANT
PACK CV ACCESS (CUSTOM PROCEDURE TRAY) ×2 IMPLANT
PAD ARMBOARD 7.5X6 YLW CONV (MISCELLANEOUS) ×4 IMPLANT
SPONGE SURGIFOAM ABS GEL 100 (HEMOSTASIS) IMPLANT
SUT MNCRL AB 4-0 PS2 18 (SUTURE) ×4 IMPLANT
SUT PROLENE 6 0 BV (SUTURE) ×4 IMPLANT
SUT SILK 2 0 SH (SUTURE) IMPLANT
SUT VIC AB 3-0 SH 27 (SUTURE) ×4
SUT VIC AB 3-0 SH 27X BRD (SUTURE) ×2 IMPLANT
TOWEL GREEN STERILE (TOWEL DISPOSABLE) ×2 IMPLANT
UNDERPAD 30X36 HEAVY ABSORB (UNDERPADS AND DIAPERS) ×2 IMPLANT
WATER STERILE IRR 1000ML POUR (IV SOLUTION) ×2 IMPLANT

## 2021-04-30 NOTE — Interval H&P Note (Signed)
History and Physical Interval Note:  04/30/2021 1:20 PM  Carolyn Russo  has presented today for surgery, with the diagnosis of ESRD.  The various methods of treatment have been discussed with the patient and family. After consideration of risks, benefits and other options for treatment, the patient has consented to  Procedure(s): RIGHT FIRST STAGE [place] versus ARTERIOVENOUS GRAFT (Right) as a surgical intervention.  The patient's history has been reviewed, patient examined, no change in status, stable for surgery.  I have reviewed the patient's chart and labs.  Questions were answered to the patient's satisfaction.     Deitra Mayo

## 2021-04-30 NOTE — Anesthesia Procedure Notes (Signed)
Anesthesia Regional Block: Supraclavicular block   Pre-Anesthetic Checklist: , timeout performed,  Correct Patient, Correct Site, Correct Laterality,  Correct Procedure, Correct Position, site marked,  Risks and benefits discussed,  Surgical consent,  Pre-op evaluation,  At surgeon's request and post-op pain management  Laterality: Right and Lower  Prep: chloraprep       Needles:  Injection technique: Single-shot      Needle Length: 5cm  Needle Gauge: 22     Additional Needles: Arrow StimuQuik ECHO Echogenic Stimulating PNB Needle  Procedures:,,,, ultrasound used (permanent image in chart),,    Narrative:  Start time: 04/30/2021 1:30 PM End time: 04/30/2021 1:37 PM Injection made incrementally with aspirations every 5 mL.  Performed by: Personally  Anesthesiologist: Oleta Mouse, MD

## 2021-04-30 NOTE — Progress Notes (Signed)
PROGRESS NOTE   Subjective/Complaints: No complaints following new fistula placement Continues to complain of left sided abdominal pain  ROS: Patient denies fever, rash, sore throat, blurred vision, nausea, vomiting, diarrhea, cough, joint or back pain, headache, or mood change. +abdominal pain, +left sided chest pain, +left shoulder pain   Objective:   DG Chest 2 View  Result Date: 04/29/2021 CLINICAL DATA:  Pt experiencing SOB  CVA EXAM: CHEST - 2 VIEW COMPARISON:  04/22/2021 FINDINGS: Relatively low lung volumes. Chronic linear scarring or subsegmental atelectasis in the left infrahilar region and laterally in the left mid lung. Heart size upper limits normal. Tunneled right IJ hemodialysis catheter extends to the mid right atrium as before. No effusion.  No pneumothorax. Thoracic dextroscoliosis. IMPRESSION: Low lung volumes.  No acute findings. Electronically Signed   By: Lucrezia Europe M.D.   On: 04/29/2021 11:42   Recent Labs    04/29/21 1325 04/30/21 0508  WBC 12.1* 14.4*  HGB 10.0* 9.9*  HCT 29.5* 28.3*  PLT 261 254    Recent Labs    04/29/21 1325 04/30/21 0508  NA 129* 131*  K 4.9 4.4  CL 94* 97*  CO2 26 25  GLUCOSE 397* 287*  BUN 28* 21  CREATININE 5.73* 4.82*  CALCIUM 8.6* 8.9      Intake/Output Summary (Last 24 hours) at 04/30/2021 1652 Last data filed at 04/30/2021 1452 Gross per 24 hour  Intake 400 ml  Output 5 ml  Net 395 ml        Physical Exam: Vital Signs Blood pressure 124/79, pulse 64, temperature 98.1 F (36.7 C), temperature source Oral, resp. rate 17, height 5\' 2"  (1.575 m), weight 82.9 kg, SpO2 100 %. Gen: no distress, normal appearing HEENT: oral mucosa pink and moist, NCAT Cardio: Reg rate Chest: normal effort, normal rate of breathing Abd: soft, non-distended Ext: no edema Psych: pleasant, flat affect Musculoskeletal:        General: No swelling.    Cervical back: Normal range  of motion.    Comments: Mild tenderness left shoulder with ER/IR today  Skin:    General: Skin is warm and dry. Neurological:    Comments: alert, follows commands.   Fair insight and awareness. Provided biographical information. Normal language, speech slightly slurred. Mild left central 7. LUE 2 to 2+/5 deltoid, 3/5 biceps, triceps and 3+/5 wrist and HI. LLE 3-4/5 prox to distal. RUE and RLE 4-5/5 prox to distal. No sensory deficits noted--motor/sensory exam stable today.   Assessment/Plan: 1. Functional deficits which require 3+ hours per day of interdisciplinary therapy in a comprehensive inpatient rehab setting. Physiatrist is providing close team supervision and 24 hour management of active medical problems listed below. Physiatrist and rehab team continue to assess barriers to discharge/monitor patient progress toward functional and medical goals  Care Tool:  Bathing    Body parts bathed by patient: Right arm, Left arm, Chest, Abdomen, Front perineal area, Right upper leg, Left upper leg   Body parts bathed by helper: Right lower leg, Left lower leg, Buttocks     Bathing assist Assist Level: Minimal Assistance - Patient > 75%     Upper Body Dressing/Undressing Upper body  dressing   What is the patient wearing?: Pull over shirt    Upper body assist Assist Level: Minimal Assistance - Patient > 75%    Lower Body Dressing/Undressing Lower body dressing      What is the patient wearing?: Pants, Incontinence brief     Lower body assist Assist for lower body dressing: Minimal Assistance - Patient > 75%     Toileting Toileting    Toileting assist Assist for toileting: Minimal Assistance - Patient > 75%     Transfers Chair/bed transfer  Transfers assist     Chair/bed transfer assist level: Contact Guard/Touching assist     Locomotion Ambulation   Ambulation assist      Assist level: Contact Guard/Touching assist Assistive device: Walker-rolling Max  distance: 115'   Walk 10 feet activity   Assist     Assist level: Contact Guard/Touching assist Assistive device: Walker-rolling   Walk 50 feet activity   Assist Walk 50 feet with 2 turns activity did not occur: Safety/medical concerns  Assist level: Contact Guard/Touching assist Assistive device: Walker-rolling    Walk 150 feet activity   Assist Walk 150 feet activity did not occur: Safety/medical concerns         Walk 10 feet on uneven surface  activity   Assist Walk 10 feet on uneven surfaces activity did not occur: Safety/medical concerns         Wheelchair     Assist Will patient use wheelchair at discharge?:  (TBD) Type of Wheelchair: Manual    Wheelchair assist level: Dependent - Patient 0% Max wheelchair distance: 150'    Wheelchair 50 feet with 2 turns activity    Assist        Assist Level: Dependent - Patient 0%   Wheelchair 150 feet activity     Assist      Assist Level: Dependent - Patient 0%   Blood pressure 124/79, pulse 64, temperature 98.1 F (36.7 C), temperature source Oral, resp. rate 17, height 5\' 2"  (1.575 m), weight 82.9 kg, SpO2 100 %.  Medical Problem List and Plan: 1.  Left side weakness secondary to nonhemorrhagic right basal ganglia infarction             -patient may shower             -ELOS/Goals: 10-12 days, mod I goals for PT, OT, SLP  -Continue CIR therapies including PT, OT, and SLP   2.  Impaired mobility: -DVT/anticoagulation:  continue SCD             -antiplatelet therapy:ASA  7/1- allowed to restart Plavix- can add Plavix in 5-7 days if not bleeding (7/7) 3. Right hip bursitis: continue Tramadol 50 mg every 12 hours as needed, ice, ROM, lidocaine patch 4. Mood: Xanax 0.5 mg 3 times daily as needed.  Provide emotional support             -antipsychotic agents: N/A 5. Neuropsych: This patient is capable of making decisions on her own behalf. 6. Skin/Wound Care: Routine skin checks 7.  Fluids/Electrolytes/Nutrition: Routine in and outs with follow-up chemistries 8.  Acute lower GI bleed with blood loss anemia.  Follow-up GI services.  Resume ASA as above. - Continue Protonix 40 mg twice daily 7/6: hgb 9.6--monitor for gross bleeding 9.  Chronic diastolic congestive heart failure.  Monitor for any signs of fluid overload.  Continue Lasix 40-80 mg Monday Wednesday Friday             -daily weights,  have been stable 10.  Newly Dx'd End-stage renal disease.  Hemodialysis as per renal services 11.  Hypertension.  Increase Coreg to 12.5 mg twice daily. Aggressive filtration with ultradialysis tomorrow 12.  Asthma.  Continue inhalers as directed  - was getting Home O2 prior to admission- will likely need to go home on 2L O2-   Was trying to get at home, but hadn't yet.   7/1- con't nebs prn, home O2 ?PRN only  7/3 lungs very clear today, sats 99% on RA. Some of SOB may be behaviorally related. Reassured pt today that lungs sounded fine.   -encouraged FV/IS, OOB  7/7: ordered CXT, ordered incentive spirometer 13.Hyperlipidemia.Crestor 14.Obesity.BMI 34.31.Dietery follow up 15.  Diabetes mellitus.hemoglobin AIc 5.2.  CBG checks restarted. Increase Lantus to 7U BID.   16. Leukocytosis:trending up, no signs of infection, repeat tomorrow to trend 17. Constipation: KUB shows moderate stool burden. Increase senna-docusate to BID.   18. Fistula placement: successful, new fistula accessed without complications.  19. Left shoulder pain: ordered lidocaine patch    LOS: 10 days A FACE TO FACE EVALUATION WAS PERFORMED  Clide Deutscher Chanley Mcenery 04/30/2021, 4:52 PM

## 2021-04-30 NOTE — Anesthesia Preprocedure Evaluation (Signed)
Anesthesia Evaluation  Patient identified by MRN, date of birth, ID band Patient confused    Reviewed: Allergy & Precautions, NPO status , Patient's Chart, lab work & pertinent test results  History of Anesthesia Complications Negative for: history of anesthetic complications  Airway Mallampati: III  TM Distance: >3 FB Neck ROM: Full    Dental  (+) Dental Advisory Given   Pulmonary shortness of breath, asthma , neg COPD,    breath sounds clear to auscultation       Cardiovascular hypertension, Pt. on medications and Pt. on home beta blockers + CAD and +CHF   Rhythm:Regular Rate:Normal  5/22 tte:  1. Left ventricular ejection fraction, by estimation, is 50%. The left  ventricle has low normal function. The left ventricle has no regional wall  motion abnormalities. There is mild left ventricular hypertrophy. Left  ventricular diastolic parameters are  consistent with Grade II diastolic dysfunction (pseudonormalization).  Elevated left ventricular end-diastolic pressure. The E/e' is 97.  2. Right ventricular systolic function is normal. The right ventricular  size is normal. There is moderately elevated pulmonary artery systolic  pressure. The estimated right ventricular systolic pressure is 35.7 mmHg.  3. Left atrial size was mildly dilated.  4. The mitral valve is grossly normal. Trivial mitral valve  regurgitation. No evidence of mitral stenosis.  5. The aortic valve is grossly normal. There is mild calcification of the  aortic valve. Aortic valve regurgitation is not visualized. Mild aortic  valve sclerosis is present, with no evidence of aortic valve stenosis.  6. The inferior vena cava is dilated in size with >50% respiratory  variability, suggesting right atrial pressure of 8 mmHg.   Neuro/Psych  Headaches, CVA negative psych ROS   GI/Hepatic Neg liver ROS, GERD  Medicated and Controlled,  Endo/Other  diabetes,  Type 2, Insulin DependentObesity   Renal/GU ESRF and DialysisRenal disease     Musculoskeletal negative musculoskeletal ROS (+)   Abdominal   Peds  Hematology  (+) Blood dyscrasia (Plavix), anemia ,   Anesthesia Other Findings Previous hypotension under anesthesia resulting in case cancellation.   Reproductive/Obstetrics                             Anesthesia Physical Anesthesia Plan  ASA: 4  Anesthesia Plan: MAC and Regional   Post-op Pain Management:    Induction: Intravenous  PONV Risk Score and Plan: 2 and Treatment may vary due to age or medical condition  Airway Management Planned: Nasal Cannula  Additional Equipment: None  Intra-op Plan:   Post-operative Plan:   Informed Consent: I have reviewed the patients History and Physical, chart, labs and discussed the procedure including the risks, benefits and alternatives for the proposed anesthesia with the patient or authorized representative who has indicated his/her understanding and acceptance.     Dental advisory given  Plan Discussed with: CRNA and Surgeon  Anesthesia Plan Comments:         Anesthesia Quick Evaluation

## 2021-04-30 NOTE — Transfer of Care (Signed)
Immediate Anesthesia Transfer of Care Note  Patient: Carolyn Russo  Procedure(s) Performed: RIGHT FIRST STAGE BASCILIC VEIN TRANSPOSITION (Right: Arm Upper)  Patient Location: PACU  Anesthesia Type:MAC and Regional  Level of Consciousness: drowsy and patient cooperative  Airway & Oxygen Therapy: Patient Spontanous Breathing  Post-op Assessment: Report given to RN and Post -op Vital signs reviewed and stable  Post vital signs: Reviewed and stable  Last Vitals:  Vitals Value Taken Time  BP 153/71 04/30/21 1504  Temp 36.4 C 04/30/21 1504  Pulse 70 04/30/21 1506  Resp 20 04/30/21 1506  SpO2 93 % 04/30/21 1506  Vitals shown include unvalidated device data.  Last Pain:  Vitals:   04/30/21 1325  TempSrc:   PainSc: 5       Patients Stated Pain Goal: 3 (49/75/30 0511)  Complications: No notable events documented.

## 2021-04-30 NOTE — Op Note (Signed)
    NAME: Carolyn Russo    MRN: [id number] DOB: [birth year]    DATE OF OPERATION: 04/30/2021  PREOP DIAGNOSIS:    End-stage renal disease  POSTOP DIAGNOSIS:    Same  PROCEDURE:    Right first stage basilic vein transposition  SURGEON: Judeth Cornfield. Scot Dock, MD  ASSIST: Marlinda Mike, PA  ANESTHESIA: Axillary block  EBL: Minimal  INDICATIONS:    Carolyn Russo is a 71 y.o. female who presents for a for stage basilic vein transposition  FINDINGS:   3.5 mm basilic vein.  3.5 mm brachial artery.  TECHNIQUE:   The patient was taken to the operating room after an axillary block was placed by anesthesia.  The right upper extremity was prepped and draped in usual sterile fashion.  The previous incision was opened and the basilic vein was dissected free.  It was a 3.5 mm vein.  This had previously been dissected free but she had developed ischemic changes intraoperatively and the surgery had been aborted.  Through the same incision I dissected free the brachial artery beneath the fascia.  This was about 3.5 mm artery.  The patient was heparinized.  The vein was ligated distally and spatulated.  The artery was clamped proximally and distally and a longitudinal arteriotomy was made.  The vein was sewn end-to-side to the artery using continuous 6-0 Prolene suture.  At the completion there was an excellent thrill in the fistula and a good radial and ulnar signal with a Doppler.  The heparin was partially reversed with protamine.  The wound was closed with a deep layer of 3-0 Vicryl and the skin closed with 4-0 Vicryl.  Dermabond was applied.  The patient tolerated the procedure well was transferred to the recovery room in stable condition.  All needle and sponge counts were correct.  Given the complexity of the case a first assistant was necessary in order to expedient the procedure and safely perform the technical aspects of the operation.  Deitra Mayo, MD, FACS [facility]  DATE OF DICTATION:   04/30/2021

## 2021-04-30 NOTE — Anesthesia Procedure Notes (Signed)
Procedure Name: MAC Date/Time: 04/30/2021 1:47 PM Performed by: Claris Che, CRNA Pre-anesthesia Checklist: Patient identified, Emergency Drugs available, Suction available, Patient being monitored and Timeout performed Patient Re-evaluated:Patient Re-evaluated prior to induction Oxygen Delivery Method: Simple face mask Dental Injury: Teeth and Oropharynx as per pre-operative assessment

## 2021-04-30 NOTE — Progress Notes (Signed)
[facility] Progress Note   Subjective:   last HD on 7/7 with 1.2 kg UF.  Off schedule this week for procedure 7/8 per charting.  She states signed off early from HD as she didn't feel well as below. Normally goes to church on Saturday but is ok with coming to HD tomorrow while here.  Has AVF scheduled for later today  Review of systems:  States was short of breath with HD States some nausea on HD Walking with walker   Objective Vitals:   04/30/21 0410 04/30/21 0450 04/30/21 0812 04/30/21 0813  BP: (!) 150/65     Pulse: 72     Resp: 19     Temp: (!) 97.4 F (36.3 C)     TempSrc: Oral     SpO2: 96%  98% 98%  Weight:  82.9 kg    Height:       Physical Exam  General adult female in bed in no acute distress HEENT normocephalic atraumatic extraocular movements intact sclera anicteric Neck supple trachea midline Lungs clear to auscultation bilaterally normal work of breathing at rest  Heart S1S2 no rub Abdomen soft nontender nondistended Extremities no pitting edema  Psych normal mood and affect Neuro - hard of hearing. Ambulates slowly with walker and assistance Access: RIJ tunn HD catheter  Filed Weights   04/29/21 1440 04/29/21 1652 04/30/21 0450  Weight: 83.4 kg 82.2 kg 82.9 kg    Intake/Output Summary (Last 24 hours) at 04/30/2021 0901 Last data filed at 04/29/2021 1623 Gross per 24 hour  Intake 240 ml  Output 1230 ml  Net -990 ml    Additional Objective Labs: Basic Metabolic Panel: Recent Labs  Lab 04/27/21 1017 04/29/21 1325 04/30/21 0508  NA 130* 129* 131*  K 4.9 4.9 4.4  CL 96* 94* 97*  CO2 25 26 25   GLUCOSE 329* 397* 287*  BUN 41* 28* 21  CREATININE 7.15* 5.73* 4.82*  CALCIUM 9.2 8.6* 8.9  PHOS 4.5 4.2  --    Liver Function Tests: Recent Labs  Lab 04/27/21 1017 04/29/21 1325  ALBUMIN 3.1* 3.1*    CBC: Recent Labs  Lab 04/27/21 1017 04/29/21 1325 04/30/21 0508  WBC 9.9 12.1* 14.4*  HGB 9.6* 10.0* 9.9*  HCT 28.5* 29.5*  28.3*  MCV 86.4 86.8 84.2  PLT 271 261 254    CBG: Recent Labs  Lab 04/29/21 1118 04/29/21 1730 04/30/21 0548  GLUCAP 346* 350* 306*     Medications:   ceFAZolin (ANCEF) IV      arformoterol  15 mcg Nebulization BID   aspirin EC  81 mg Oral Daily   budesonide (PULMICORT) nebulizer solution  0.5 mg Nebulization BID   carvedilol  12.5 mg Oral BID WC   Chlorhexidine Gluconate Cloth  6 each Topical BID   [START ON 05/07/2021] darbepoetin (ARANESP) injection - DIALYSIS  100 mcg Intravenous Q Fri-HD   fluticasone  2 spray Each Nare Daily   furosemide  40 mg Oral Q M,W,F   hydrocortisone  25 mg Rectal BID   insulin glargine  5 Units Subcutaneous BID   ketotifen  2 drop Both Eyes BID   lidocaine  1 patch Transdermal Q24H   lidocaine  1 patch Transdermal Q24H   losartan  25 mg Oral Daily   pantoprazole  40 mg Oral BID   rosuvastatin  20 mg Oral Daily   senna-docusate  2 tablet Oral BID   sevelamer carbonate  0.8 g Oral TID WC  Dialysis Orders: [facility] 4 hrs 180NRe 400/Autoflow 1.5 86.5 kg 2.0K/2.0 Ca TDC -heparin 2000 units IV TIW -Mircera 100 mcg IV q 2 weeks (last dose 04/14/2021 -Hectorol 5 mcg IV TIW   Assessment/Plan: Acute subcortical R CVA-per Primary/Neurology.  In CIR. Acute LGIB- Hx hemorrhoidal bleeding in past. new onset 06/25 post clopidogrel load. s/p 2 units PRBCS on 04/18/21.  Clopidogrel and ASA on hold. No heparin with HD. Per GI: EGD and flex sig with no etiology and bleeding scan (-). ESRD -  MWF schedule as an outpatient.  Off heparin d/t new GIB. Has been HD TTS this week given surgery planned today then resume MWF next week.  Discontinued fleet's enema PRN as pt is ESRD.  For AVF today as noted below  Hypertension/volume  -BP has been high. Optimize volume with HD Anemia - Hgb 9.6. As noted in # 2. aranesp 177mcg dosed on 7/5 and switch back to Wednesdays when she is back on schedule. Transfuse PRN. Metabolic bone disease -  PTH only 110 03/26/21 on 5  mcg Hectorol, hectorol was held at that time.  Rechecked pth 128. No need to restart VDRA.  Changed renvela to powder d/t patient inability to swallow pills.  Nutrition - Now on dysphagia diet when taking PO DTM2- per primary Pleuritic abdominal/chest pain: improved HD access: Has TDC. basilic vein transposition on 7/8.  Appreciate help from Dr. Nash Shearer, MD [facility] 04/30/2021,9:18 AM

## 2021-04-30 NOTE — Progress Notes (Signed)
Pt return to 4W03. Vitals obtained, CBG obtained. Assessed new fistula site. No complications noted. Skin glue intact. Pt AxO4  Sheela Stack, LPN

## 2021-05-01 LAB — RENAL FUNCTION PANEL
Albumin: 2.9 g/dL — ABNORMAL LOW (ref 3.5–5.0)
Anion gap: 7 (ref 5–15)
BUN: 23 mg/dL (ref 8–23)
CO2: 27 mmol/L (ref 22–32)
Calcium: 8.8 mg/dL — ABNORMAL LOW (ref 8.9–10.3)
Chloride: 98 mmol/L (ref 98–111)
Creatinine, Ser: 5.5 mg/dL — ABNORMAL HIGH (ref 0.44–1.00)
GFR, Estimated: 8 mL/min — ABNORMAL LOW (ref >60)
Glucose, Bld: 163 mg/dL — ABNORMAL HIGH (ref 70–99)
Phosphorus: 4.7 mg/dL — ABNORMAL HIGH (ref 2.5–4.6)
Potassium: 4.2 mmol/L (ref 3.5–5.1)
Sodium: 132 mmol/L — ABNORMAL LOW (ref 135–145)

## 2021-05-01 LAB — GLUCOSE, CAPILLARY
Glucose-Capillary: 211 mg/dL — ABNORMAL HIGH (ref 70–99)
Glucose-Capillary: 303 mg/dL — ABNORMAL HIGH (ref 70–99)

## 2021-05-01 LAB — CBC WITH DIFFERENTIAL/PLATELET
Abs Immature Granulocytes: 0.03 10*3/uL (ref 0.00–0.07)
Basophils Absolute: 0.1 10*3/uL (ref 0.0–0.1)
Basophils Relative: 1 %
Eosinophils Absolute: 0.2 10*3/uL (ref 0.0–0.5)
Eosinophils Relative: 2 %
HCT: 28.4 % — ABNORMAL LOW (ref 36.0–46.0)
Hemoglobin: 9.8 g/dL — ABNORMAL LOW (ref 12.0–15.0)
Immature Granulocytes: 0 %
Lymphocytes Relative: 34 %
Lymphs Abs: 4.4 10*3/uL — ABNORMAL HIGH (ref 0.7–4.0)
MCH: 29.7 pg (ref 26.0–34.0)
MCHC: 34.5 g/dL (ref 30.0–36.0)
MCV: 86.1 fL (ref 80.0–100.0)
Monocytes Absolute: 1.4 10*3/uL — ABNORMAL HIGH (ref 0.1–1.0)
Monocytes Relative: 11 %
Neutro Abs: 6.9 10*3/uL (ref 1.7–7.7)
Neutrophils Relative %: 52 %
Platelets: 238 10*3/uL (ref 150–400)
RBC: 3.3 MIL/uL — ABNORMAL LOW (ref 3.87–5.11)
RDW: 15 % (ref 11.5–15.5)
WBC: 13 10*3/uL — ABNORMAL HIGH (ref 4.0–10.5)
nRBC: 0 % (ref 0.0–0.2)

## 2021-05-01 MED ORDER — DICLOFENAC SODIUM 1 % EX GEL
2.0000 g | Freq: Four times a day (QID) | CUTANEOUS | Status: DC
  Administered 2021-05-01 – 2021-05-05 (×17): 2 g via TOPICAL
  Filled 2021-05-01: qty 100

## 2021-05-01 MED ORDER — INSULIN GLARGINE 100 UNIT/ML ~~LOC~~ SOLN
10.0000 [IU] | Freq: Two times a day (BID) | SUBCUTANEOUS | Status: DC
  Administered 2021-05-01: 10 [IU] via SUBCUTANEOUS
  Filled 2021-05-01 (×3): qty 0.1

## 2021-05-01 NOTE — Progress Notes (Signed)
Occupational Therapy Session Note  Patient Details  Name: Carolyn Russo MRN: [id number] Date of Birth: 10-03-1951  Today's Date: 05/01/2021 OT Individual Time: 2336-1224 OT Individual Time Calculation (min): 57 min     Skilled Therapeutic Interventions/Progress Updates:    Pt greeted while sitting EOB. Her spouse, [name], was present. Pt reported that she needed to use the restroom. Increased time and 2 attempts in order for pt to complete sit<stand with CGA using RW. Noted deficits with initiation during sit<stands that combines with her general fatigue, pt therefore needing increased time to participate in all functional tasks + transfers. CGA for ambulatory transfer to toilet using RW, pt required vcs and manual assist for walker proximity and to keep Lt hemibody inside of device. Mod A for clothing mgt on the Lt side, pt able to use her affected Lt hand to assist therapist. Pt with +bladder void, CGA for dynamic sitting while completing perihygiene due to Lt lean and decreased awareness. Continued working on ADL retraining while pt donned new pull ups + pants at sit<stand level. Note that she had an incontinent episode in her brief. Pt able to utilize figure 4 functionally with cues and increased time, often asks for help before attempting task and states she will require more help than she really does need. Cues to consistently use the RW as she tended to keep both hands on the grab bar, cues also for Lt attention. Pt ambulated using RW to the sink to wash her hands in standing, the same safety cues provided for ambulation as written above. She then transferred to the w/c and asked for some ice chips. Full supervision provided while she consumed ice chips per swallowing safety instructions. No s/s aspiration. Min A for stand pivot<bed. She needed to rest for ~3 minutes before transitioning to supine with supervision assist. Pt remained in bed at close of session, all needs within reach and bed alarm  set.   Therapy Documentation Precautions:  Precautions Precautions: Fall Restrictions Weight Bearing Restrictions: No  Vital Signs: Therapy Vitals Temp: 98.3 F (36.8 C) Temp Source: Oral Pulse Rate: 78 Resp: 18 BP: (!) 157/82 Patient Position (if appropriate): Sitting Oxygen Therapy SpO2: 99 % O2 Device: Room Air Pain: in the left shoulder, provided pt with instant hot packs to address at end of tx. Notified RN to check on her in ~20 minutes to assess skin   ADL: ADL Grooming: Minimal assistance Where Assessed-Grooming: Sitting at sink Upper Body Bathing: Minimal assistance Where Assessed-Upper Body Bathing: Chair Lower Body Bathing: Maximal assistance Where Assessed-Lower Body Bathing: Sitting at sink Upper Body Dressing: Moderate assistance Where Assessed-Upper Body Dressing: Sitting at sink Lower Body Dressing: Maximal assistance Where Assessed-Lower Body Dressing: Sitting at sink Toilet Transfer: Moderate assistance Toilet Transfer Method: Stand pivot     Therapy/Group: Individual Therapy  Murvin Gift A Atlee Kluth 05/01/2021, 3:46 PM

## 2021-05-01 NOTE — Progress Notes (Signed)
PROGRESS NOTE   Subjective/Complaints: WBC trending down Had dialysis today Cr trending up She has no new complains, still with left sided abdominal pain  ROS: Patient denies fever, rash, sore throat, blurred vision, nausea, vomiting, diarrhea, cough, joint or back pain, headache, or mood change. +abdominal pain, +left sided chest pain, +left shoulder pain   Objective:   No results found. Recent Labs    04/30/21 0508 05/01/21 0553  WBC 14.4* 13.0*  HGB 9.9* 9.8*  HCT 28.3* 28.4*  PLT 254 238    Recent Labs    04/30/21 0508 05/01/21 0553  NA 131* 132*  K 4.4 4.2  CL 97* 98  CO2 25 27  GLUCOSE 287* 163*  BUN 21 23  CREATININE 4.82* 5.50*  CALCIUM 8.9 8.8*      Intake/Output Summary (Last 24 hours) at 05/01/2021 2043 Last data filed at 05/01/2021 1840 Gross per 24 hour  Intake 240 ml  Output 1518 ml  Net -1278 ml        Physical Exam: Vital Signs Blood pressure (!) 155/80, pulse 80, temperature 97.7 F (36.5 C), temperature source Oral, resp. rate 18, height 5\' 2"  (1.575 m), weight 80.4 kg, SpO2 100 %. Gen: no distress, normal appearing HEENT: oral mucosa pink and moist, NCAT Cardio: Reg rate Chest: normal effort, normal rate of breathing Abd: soft, non-distended Ext: no edema  Psych: pleasant, flat affect Musculoskeletal:        General: No swelling.    Cervical back: Normal range of motion.    Comments: Mild tenderness left shoulder with ER/IR today  Skin:    General: Skin is warm and dry. Neurological:    Comments: alert, follows commands.   Fair insight and awareness. Provided biographical information. Normal language, speech slightly slurred. Mild left central 7. LUE 2 to 2+/5 deltoid, 3/5 biceps, triceps and 3+/5 wrist and HI. LLE 3-4/5 prox to distal. RUE and RLE 4-5/5 prox to distal. No sensory deficits noted--motor/sensory exam stable today.   Assessment/Plan: 1. Functional deficits  which require 3+ hours per day of interdisciplinary therapy in a comprehensive inpatient rehab setting. Physiatrist is providing close team supervision and 24 hour management of active medical problems listed below. Physiatrist and rehab team continue to assess barriers to discharge/monitor patient progress toward functional and medical goals  Care Tool:  Bathing    Body parts bathed by patient: Right arm, Left arm, Chest, Abdomen, Front perineal area, Right upper leg, Left upper leg   Body parts bathed by helper: Right lower leg, Left lower leg, Buttocks     Bathing assist Assist Level: Minimal Assistance - Patient > 75%     Upper Body Dressing/Undressing Upper body dressing   What is the patient wearing?: Pull over shirt    Upper body assist Assist Level: Minimal Assistance - Patient > 75%    Lower Body Dressing/Undressing Lower body dressing      What is the patient wearing?: Pants, Incontinence brief     Lower body assist Assist for lower body dressing: Minimal Assistance - Patient > 75%     Toileting Toileting    Toileting assist Assist for toileting: Minimal Assistance - Patient > 75%  Transfers Chair/bed transfer  Transfers assist     Chair/bed transfer assist level: Contact Guard/Touching assist     Locomotion Ambulation   Ambulation assist      Assist level: Contact Guard/Touching assist Assistive device: Walker-rolling Max distance: 115'   Walk 10 feet activity   Assist     Assist level: Contact Guard/Touching assist Assistive device: Walker-rolling   Walk 50 feet activity   Assist Walk 50 feet with 2 turns activity did not occur: Safety/medical concerns  Assist level: Contact Guard/Touching assist Assistive device: Walker-rolling    Walk 150 feet activity   Assist Walk 150 feet activity did not occur: Safety/medical concerns         Walk 10 feet on uneven surface  activity   Assist Walk 10 feet on uneven surfaces  activity did not occur: Safety/medical concerns         Wheelchair     Assist Will patient use wheelchair at discharge?:  (TBD) Type of Wheelchair: Manual    Wheelchair assist level: Dependent - Patient 0% Max wheelchair distance: 150'    Wheelchair 50 feet with 2 turns activity    Assist        Assist Level: Dependent - Patient 0%   Wheelchair 150 feet activity     Assist      Assist Level: Dependent - Patient 0%   Blood pressure (!) 155/80, pulse 80, temperature 97.7 F (36.5 C), temperature source Oral, resp. rate 18, height 5\' 2"  (1.575 m), weight 80.4 kg, SpO2 100 %.  Medical Problem List and Plan: 1.  Left side weakness secondary to nonhemorrhagic right basal ganglia infarction             -patient may shower             -ELOS/Goals: 10-12 days, mod I goals for PT, OT, SLP  -Continue CIR therapies including PT, OT, and SLP   2.  Impaired mobility: -DVT/anticoagulation:  continue SCD             -antiplatelet therapy:ASA  7/1- allowed to restart Plavix- can add Plavix in 5-7 days if not bleeding (7/7) 3. Right hip bursitis: continue Tramadol 50 mg every 12 hours as needed, ice, ROM, lidocaine patch 4. Anxiety: continue Xanax 0.5 mg 3 times daily as needed.  Provide emotional support             -antipsychotic agents: N/A 5. Neuropsych: This patient is capable of making decisions on her own behalf. 6. Skin/Wound Care: Routine skin checks 7. Fluids/Electrolytes/Nutrition: Routine in and outs with follow-up chemistries 8.  Acute lower GI bleed with blood loss anemia.  Follow-up GI services.  Resume ASA as above. - Continue Protonix 40 mg twice daily 7/6: hgb 9.6--monitor for gross bleeding 9.  Chronic diastolic congestive heart failure.  Monitor for any signs of fluid overload.  Continue Lasix 40-80 mg Monday Wednesday Friday             -daily weights, have been stable 10.  Newly Dx'd End-stage renal disease.  Hemodialysis as per renal services 11.   Hypertension.  Increase Coreg to 12.5 mg twice daily. Aggressive filtration with ultradialysis tomorrow 12.  Asthma.  Continue inhalers as directed  - was getting Home O2 prior to admission- will likely need to go home on 2L O2-   Was trying to get at home, but hadn't yet.   7/1- con't nebs prn, home O2 ?PRN only  7/3 lungs very clear today, sats 99%  on RA. Some of SOB may be behaviorally related. Reassured pt today that lungs sounded fine.   -encouraged FV/IS, OOB  7/7: ordered CXT-stable, ordered incentive spirometer 13.Hyperlipidemia.Crestor 14.Obesity.BMI 34.31.Dietery follow up 15.  Diabetes mellitus.hemoglobin AIc 5.2.  CBGs 211-310. Increase Lantus to 10U BID.   16. Leukocytosis: trending down, repeat monday 17. Constipation: KUB shows moderate stool burden. Increase senna-docusate to BID.   18. Fistula placement: successful, new fistula accessed without complications.  19. Left shoulder pain: ordered lidocaine patch and voltaren gel.     LOS: 11 days A FACE TO FACE EVALUATION WAS PERFORMED  Izora Ribas 05/01/2021, 8:43 PM

## 2021-05-01 NOTE — Progress Notes (Signed)
Physical Therapy Session Note  Patient Details  Name: Carolyn Russo MRN: [id number] Date of Birth: [birth year]  Today's Date: 05/01/2021 PT Missed Time: 60 Minutes Missed Time Reason: Other (Comment) (Dialysis)  Patient off the floor for dialysis. Missed 60 minutes of scheduled physical therapy.   Tawana Scale , PT, DPT, NCS, CSRS  05/01/2021, 3:19 PM

## 2021-05-01 NOTE — Progress Notes (Signed)
[facility] Progress Note   Subjective:   last HD on 7/7 with 1.2 kg UF.  S/p AVF on 7/8 with vascular - right first stage basilic vein transposition.  Tired after surgery yesterday   Review of systems:  Denies shortness of breath or chest pain  Denies n/v   Objective Vitals:   04/30/21 2052 05/01/21 0413 05/01/21 0846 05/01/21 0848  BP: (!) 158/50 (!) 118/93    Pulse: 70 69    Resp: 19 17    Temp: (!) 97.5 F (36.4 C) 97.9 F (36.6 C)    TempSrc: Oral     SpO2: 100% 94% 98% 98%  Weight:      Height:       Physical Exam   General adult female in bed in no acute distress HEENT normocephalic atraumatic extraocular movements intact sclera anicteric Neck supple trachea midline Lungs clear to auscultation bilaterally normal work of breathing at rest  Heart S1S2 no rub Abdomen soft nontender nondistended Extremities no pitting edema  Psych normal mood and affect Neuro - hard of hearing.  Access: RIJ tunn HD catheter and RUE AVF with bruit   Filed Weights   04/29/21 1652 04/30/21 0450 04/30/21 1241  Weight: 82.2 kg 82.9 kg 82.9 kg    Intake/Output Summary (Last 24 hours) at 05/01/2021 1100 Last data filed at 05/01/2021 0840 Gross per 24 hour  Intake 400 ml  Output 5 ml  Net 395 ml    Additional Objective Labs: Basic Metabolic Panel: Recent Labs  Lab 04/27/21 1017 04/29/21 1325 04/30/21 0508 05/01/21 0553  NA 130* 129* 131* 132*  K 4.9 4.9 4.4 4.2  CL 96* 94* 97* 98  CO2 25 26 25 27   GLUCOSE 329* 397* 287* 163*  BUN 41* 28* 21 23  CREATININE 7.15* 5.73* 4.82* 5.50*  CALCIUM 9.2 8.6* 8.9 8.8*  PHOS 4.5 4.2  --  4.7*   Liver Function Tests: Recent Labs  Lab 04/27/21 1017 04/29/21 1325 05/01/21 0553  ALBUMIN 3.1* 3.1* 2.9*    CBC: Recent Labs  Lab 04/27/21 1017 04/29/21 1325 04/30/21 0508 05/01/21 0553  WBC 9.9 12.1* 14.4* 13.0*  NEUTROABS  --   --   --  6.9  HGB 9.6* 10.0* 9.9* 9.8*  HCT 28.5* 29.5* 28.3* 28.4*  MCV 86.4 86.8  84.2 86.1  PLT 271 261 254 238    CBG: Recent Labs  Lab 04/30/21 0548 04/30/21 1134 04/30/21 1504 04/30/21 1546 04/30/21 1625  GLUCAP 306* 257* 310* 287* 304*     Medications:  sodium chloride 10 mL/hr at 04/30/21 1249    arformoterol  15 mcg Nebulization BID   aspirin EC  81 mg Oral Daily   budesonide (PULMICORT) nebulizer solution  0.5 mg Nebulization BID   carvedilol  12.5 mg Oral BID WC   Chlorhexidine Gluconate Cloth  6 each Topical BID   Chlorhexidine Gluconate Cloth  6 each Topical Q0600   [START ON 05/07/2021] darbepoetin (ARANESP) injection - DIALYSIS  100 mcg Intravenous Q Fri-HD   fluticasone  2 spray Each Nare Daily   furosemide  40 mg Oral Q M,W,F   hydrocortisone  25 mg Rectal BID   insulin glargine  7 Units Subcutaneous BID   ketotifen  2 drop Both Eyes BID   lidocaine  1 patch Transdermal Q24H   losartan  25 mg Oral Daily   pantoprazole  40 mg Oral BID   rosuvastatin  20 mg Oral Daily   senna-docusate  2 tablet  Oral BID   sevelamer carbonate  0.8 g Oral TID WC    Dialysis Orders: [facility] 4 hrs 180NRe 400/Autoflow 1.5 86.5 kg 2.0K/2.0 Ca TDC -heparin 2000 units IV TIW -Mircera 100 mcg IV q 2 weeks (last dose 04/14/2021 -Hectorol 5 mcg IV TIW   Assessment/Plan: Acute subcortical R CVA-per Primary/Neurology.  In CIR. Acute LGIB- Hx hemorrhoidal bleeding in past. new onset 06/25 post clopidogrel load. s/p 2 units PRBCS on 04/18/21.  Clopidogrel and ASA on hold. No heparin with HD. Per GI: EGD and flex sig with no etiology and bleeding scan (-). ESRD -  MWF schedule as an outpatient.  Off heparin d/t new GIB. Has been HD TTS this week given surgery planned today then resume MWF next week.  S/p right first stage basilic vein transposition on 7/8 with vascular.  Hypertension/volume - Optimize volume with HD Anemia - Hgb 9.6. As noted in # 2. aranesp 110mcg dosed on 7/5 and switch back to Wednesdays when she is back on schedule. Transfuse PRN. Metabolic bone  disease -  PTH only 110 03/26/21 on 5 mcg Hectorol, hectorol was held at that time.  Rechecked pth 128. No need to restart VDRA.  Changed renvela to powder d/t patient inability to swallow pills.  Nutrition - Now on dysphagia diet when taking PO DTM2- per primary Pleuritic abdominal/chest pain: improved   Claudia Desanctis, MD [facility] 05/01/2021, 11:07 AM

## 2021-05-01 NOTE — Progress Notes (Signed)
Occupational Therapy Session Note  Patient Details  Name: Carolyn Russo MRN: [id number] Date of Birth: [birth year]  Today's Date: 05/01/2021 OT Individual Time: 2440-1027 OT Individual Time Calculation (min): 60 min   Skilled Therapeutic Interventions/Progress Updates:    Pt greeted in bed, difficult to awaken, but did wake up. She was agreeable to engage in bathing/dressing tasks EOB at sit<stand level using RW. Supervision for supine<sit. Pt appearing very fatigued and required increased time to complete/assist during all self care tasks today. Vcs for finding needed items on her Lt side. CGA for dynamic standing balance and close supervision for dynamic sitting. Note that pt is quite particular about her setup and assistance that she receives. Max A to wash feet and Min-Mod A to don gripper socks with pt able to utilize seated figure 4 position functionally. Note that she required ~5 minutes of rest before initiating + executing final standing to pull up pants. Due to time constraints, Max A to elevate LB clothing over hips. She then side-stepped towards [facility] with CGA using RW and returned to supine. Left her repositioned for comfort, all needs within reach and bed alarm set. Tx focus placed on ADL retraining, dynamic balance, sit<stands, and activity tolerance.  Therapy Documentation Precautions:  Precautions Precautions: Fall Restrictions Weight Bearing Restrictions: No  Pain: in arms bilaterally, RN aware and stated pt was premedicated Pain Assessment Pain Scale: 0-10 Pain Score: 5  Pain Type: Acute pain Pain Location: Shoulder Pain Orientation: Left Pain Descriptors / Indicators: Aching Pain Frequency: Intermittent Pain Onset: On-going Patients Stated Pain Goal: 0 Pain Intervention(s): Medication (See eMAR);Other (Comment) (scheduled lidocaine patch) ADL: ADL Grooming: Minimal assistance Where Assessed-Grooming: Sitting at sink Upper Body Bathing: Minimal assistance Where  Assessed-Upper Body Bathing: Chair Lower Body Bathing: Maximal assistance Where Assessed-Lower Body Bathing: Sitting at sink Upper Body Dressing: Moderate assistance Where Assessed-Upper Body Dressing: Sitting at sink Lower Body Dressing: Maximal assistance Where Assessed-Lower Body Dressing: Sitting at sink Toilet Transfer: Moderate assistance Toilet Transfer Method: Stand pivot     Therapy/Group: Individual Therapy  Amaan Meyer A Caelen Higinbotham 05/01/2021, 12:50 PM

## 2021-05-01 NOTE — Progress Notes (Signed)
Progress Note    05/01/2021 10:29 AM 1 Day Post-Op  Subjective:  Denies hand pain.   Vitals:   05/01/21 0846 05/01/21 0848  BP:    Pulse:    Resp:    Temp:    SpO2: 98% 98%    Physical Exam:  General appearance: awake, alert in NAD Respirations: unlabored; no dyspnea at rest Right upper extremity: Hand is warm with 5/5 grip strength. Motor function and sensation intact. Good bruit and thrill in fistula. 2+ radial pulse. Has had right AC venipuncture. Incision(s): Well approximated. No active bleeding or hematoma.     CBC    Component Value Date/Time   WBC 13.0 (H) 05/01/2021 0553   RBC 3.30 (L) 05/01/2021 0553   HGB 9.8 (L) 05/01/2021 0553   HGB 8.0 (L) 02/17/2021 1146   HCT 28.4 (L) 05/01/2021 0553   HCT 24.0 (L) 02/17/2021 1146   PLT 238 05/01/2021 0553   PLT 376 02/17/2021 1146   MCV 86.1 05/01/2021 0553   MCV 85 02/17/2021 1146   MCH 29.7 05/01/2021 0553   MCHC 34.5 05/01/2021 0553   RDW 15.0 05/01/2021 0553   RDW 13.4 02/17/2021 1146   LYMPHSABS 4.4 (H) 05/01/2021 0553   MONOABS 1.4 (H) 05/01/2021 0553   EOSABS 0.2 05/01/2021 0553   BASOSABS 0.1 05/01/2021 0553    BMET    Component Value Date/Time   NA 132 (L) 05/01/2021 0553   NA 145 (H) 02/17/2021 1146   K 4.2 05/01/2021 0553   CL 98 05/01/2021 0553   CO2 27 05/01/2021 0553   GLUCOSE 163 (H) 05/01/2021 0553   BUN 23 05/01/2021 0553   BUN 52 (H) 02/17/2021 1146   CREATININE 5.50 (H) 05/01/2021 0553   CREATININE 0.84 10/13/2011 0903   CALCIUM 8.8 (L) 05/01/2021 0553   GFRNONAA 8 (L) 05/01/2021 0553   GFRNONAA 75 10/13/2011 0903   GFRAA 17 06/19/2020 0000   GFRAA 86 10/13/2011 0903     Intake/Output Summary (Last 24 hours) at 05/01/2021 1029 Last data filed at 04/30/2021 1452 Gross per 24 hour  Intake 400 ml  Output 5 ml  Net 395 ml    HOSPITAL MEDICATIONS Scheduled Meds:  arformoterol  15 mcg Nebulization BID   aspirin EC  81 mg Oral Daily   budesonide (PULMICORT) nebulizer  solution  0.5 mg Nebulization BID   carvedilol  12.5 mg Oral BID WC   Chlorhexidine Gluconate Cloth  6 each Topical BID   Chlorhexidine Gluconate Cloth  6 each Topical Q0600   [START ON 05/07/2021] darbepoetin (ARANESP) injection - DIALYSIS  100 mcg Intravenous Q Fri-HD   fluticasone  2 spray Each Nare Daily   furosemide  40 mg Oral Q M,W,F   hydrocortisone  25 mg Rectal BID   insulin glargine  7 Units Subcutaneous BID   ketotifen  2 drop Both Eyes BID   lidocaine  1 patch Transdermal Q24H   losartan  25 mg Oral Daily   pantoprazole  40 mg Oral BID   rosuvastatin  20 mg Oral Daily   senna-docusate  2 tablet Oral BID   sevelamer carbonate  0.8 g Oral TID WC   Continuous Infusions:  sodium chloride 10 mL/hr at 04/30/21 1249   PRN Meds:.acetaminophen **OR** acetaminophen (TYLENOL) oral liquid 160 mg/5 mL **OR** acetaminophen, albuterol, ALPRAZolam, docusate sodium, fluticasone, heparin, hydrocortisone, lidocaine (PF), lidocaine-prilocaine, ondansetron, traMADol  Assessment and Plan: POD 1 Right first stage basilic vein transposition. Stable post-op without signs or symptoms of  steal syndrome. Place restricted limb alert bracelet.   We will make arrangements for follow-up as outpatient in 4-6 weeks   Please call with questions.   Risa Grill, PA-C [facility] [phone] 05/01/2021  10:29 AM

## 2021-05-02 LAB — GLUCOSE, CAPILLARY
Glucose-Capillary: 155 mg/dL — ABNORMAL HIGH (ref 70–99)
Glucose-Capillary: 236 mg/dL — ABNORMAL HIGH (ref 70–99)
Glucose-Capillary: 286 mg/dL — ABNORMAL HIGH (ref 70–99)
Glucose-Capillary: 310 mg/dL — ABNORMAL HIGH (ref 70–99)

## 2021-05-02 MED ORDER — CHLORHEXIDINE GLUCONATE CLOTH 2 % EX PADS
6.0000 | MEDICATED_PAD | Freq: Every day | CUTANEOUS | Status: DC
  Administered 2021-05-03 – 2021-05-05 (×3): 6 via TOPICAL

## 2021-05-02 MED ORDER — INSULIN GLARGINE 100 UNIT/ML ~~LOC~~ SOLN
13.0000 [IU] | Freq: Two times a day (BID) | SUBCUTANEOUS | Status: DC
  Administered 2021-05-02 – 2021-05-05 (×8): 13 [IU] via SUBCUTANEOUS
  Filled 2021-05-02 (×9): qty 0.13

## 2021-05-02 MED ORDER — LOSARTAN POTASSIUM 50 MG PO TABS
25.0000 mg | ORAL_TABLET | Freq: Every evening | ORAL | Status: DC
  Administered 2021-05-02 – 2021-05-05 (×4): 25 mg via ORAL
  Filled 2021-05-02 (×4): qty 1

## 2021-05-02 MED ORDER — CLOPIDOGREL BISULFATE 75 MG PO TABS
75.0000 mg | ORAL_TABLET | Freq: Every day | ORAL | Status: DC
  Administered 2021-05-02 – 2021-05-05 (×4): 75 mg via ORAL
  Filled 2021-05-02 (×4): qty 1

## 2021-05-02 NOTE — Progress Notes (Signed)
PROGRESS NOTE   Subjective/Complaints: Ate all her oatmeal and pineapple this morning Discussed with her CBGs elevated to 300s, increasing her Lantus.  Continues to have abdominal pain, no new complaints  ROS: Patient denies fever, rash, sore throat, blurred vision, nausea, vomiting, diarrhea, cough, joint or back pain, headache, or mood change. +abdominal pain, +left sided chest pain, +left shoulder pain   Objective:   No results found. Recent Labs    04/30/21 0508 05/01/21 0553  WBC 14.4* 13.0*  HGB 9.9* 9.8*  HCT 28.3* 28.4*  PLT 254 238    Recent Labs    04/30/21 0508 05/01/21 0553  NA 131* 132*  K 4.4 4.2  CL 97* 98  CO2 25 27  GLUCOSE 287* 163*  BUN 21 23  CREATININE 4.82* 5.50*  CALCIUM 8.9 8.8*      Intake/Output Summary (Last 24 hours) at 05/02/2021 1802 Last data filed at 05/02/2021 1258 Gross per 24 hour  Intake 180 ml  Output 1518 ml  Net -1338 ml        Physical Exam: Vital Signs Blood pressure (!) 122/53, pulse 67, temperature (!) 97.5 F (36.4 C), temperature source Oral, resp. rate 16, height 5\' 2"  (1.575 m), weight 80.3 kg, SpO2 100 %. Gen: no distress, normal appearing HEENT: oral mucosa pink and moist, NCAT Cardio: Reg rate Chest: normal effort, normal rate of breathing Abd: soft, non-distended Ext: no edema Psych: pleasant, flat affect Musculoskeletal:        General: No swelling.    Cervical back: Normal range of motion.    Comments: Mild tenderness left shoulder with ER/IR today  Skin:    General: Skin is warm and dry. Neurological:    Comments: alert, follows commands.   Fair insight and awareness. Provided biographical information. Normal language, speech slightly slurred. Mild left central 7. LUE 2 to 2+/5 deltoid, 3/5 biceps, triceps and 3+/5 wrist and HI. LLE 3-4/5 prox to distal. RUE and RLE 4-5/5 prox to distal. No sensory deficits noted--motor/sensory exam stable  today.   Assessment/Plan: 1. Functional deficits which require 3+ hours per day of interdisciplinary therapy in a comprehensive inpatient rehab setting. Physiatrist is providing close team supervision and 24 hour management of active medical problems listed below. Physiatrist and rehab team continue to assess barriers to discharge/monitor patient progress toward functional and medical goals  Care Tool:  Bathing    Body parts bathed by patient: Right arm, Left arm, Chest, Abdomen, Front perineal area, Right upper leg, Left upper leg   Body parts bathed by helper: Right lower leg, Left lower leg, Buttocks     Bathing assist Assist Level: Minimal Assistance - Patient > 75%     Upper Body Dressing/Undressing Upper body dressing   What is the patient wearing?: Pull over shirt    Upper body assist Assist Level: Minimal Assistance - Patient > 75%    Lower Body Dressing/Undressing Lower body dressing      What is the patient wearing?: Pants, Incontinence brief     Lower body assist Assist for lower body dressing: Minimal Assistance - Patient > 75%     Toileting Toileting    Toileting assist Assist for  toileting: Minimal Assistance - Patient > 75%     Transfers Chair/bed transfer  Transfers assist     Chair/bed transfer assist level: Contact Guard/Touching assist     Locomotion Ambulation   Ambulation assist      Assist level: Contact Guard/Touching assist Assistive device: Walker-rolling Max distance: 115'   Walk 10 feet activity   Assist     Assist level: Contact Guard/Touching assist Assistive device: Walker-rolling   Walk 50 feet activity   Assist Walk 50 feet with 2 turns activity did not occur: Safety/medical concerns  Assist level: Contact Guard/Touching assist Assistive device: Walker-rolling    Walk 150 feet activity   Assist Walk 150 feet activity did not occur: Safety/medical concerns         Walk 10 feet on uneven surface   activity   Assist Walk 10 feet on uneven surfaces activity did not occur: Safety/medical concerns         Wheelchair     Assist Will patient use wheelchair at discharge?:  (TBD) Type of Wheelchair: Manual    Wheelchair assist level: Dependent - Patient 0% Max wheelchair distance: 150'    Wheelchair 50 feet with 2 turns activity    Assist        Assist Level: Dependent - Patient 0%   Wheelchair 150 feet activity     Assist      Assist Level: Dependent - Patient 0%   Blood pressure (!) 122/53, pulse 67, temperature (!) 97.5 F (36.4 C), temperature source Oral, resp. rate 16, height 5\' 2"  (1.575 m), weight 80.3 kg, SpO2 100 %.  Medical Problem List and Plan: 1.  Left side weakness secondary to nonhemorrhagic right basal ganglia infarction             -patient may shower             -ELOS/Goals: 10-12 days, mod I goals for PT, OT, SLP  -Continue CIR therapies including PT, OT, and SLP   2.  Impaired mobility: -DVT/anticoagulation:  continue SCD             -antiplatelet therapy:ASA  Plavix restarted.  3. Right hip bursitis: continue Tramadol 50 mg every 12 hours as needed, ice, ROM, lidocaine patch 4. Anxiety: continue Xanax 0.5 mg 3 times daily as needed.  Provide emotional support             -antipsychotic agents: N/A 5. Neuropsych: This patient is capable of making decisions on her own behalf. 6. Skin/Wound Care: Routine skin checks 7. Fluids/Electrolytes/Nutrition: Routine in and outs with follow-up chemistries 8.  Acute lower GI bleed with blood loss anemia.  Follow-up GI services.  Resume ASA as above. - Continue Protonix 40 mg twice daily 7/6: hgb 9.6--monitor for gross bleeding 9.  Chronic diastolic congestive heart failure.  Monitor for any signs of fluid overload.  Continue Lasix 40-80 mg Monday Wednesday Friday             -daily weights, have been stable 10.  Newly Dx'd End-stage renal disease.  Hemodialysis as per renal services 11.   Hypertension.  Increase Coreg to 12.5 mg twice daily. Aggressive filtration with ultradialysis tomorrow 12.  Asthma.  Continue inhalers as directed  - was getting Home O2 prior to admission- will likely need to go home on 2L O2-   Was trying to get at home, but hadn't yet.   7/1- con't nebs prn, home O2 ?PRN only  7/3 lungs very clear today, sats 99%  on RA. Some of SOB may be behaviorally related. Reassured pt today that lungs sounded fine.   -encouraged FV/IS, OOB  7/7: ordered CXT-stable, ordered incentive spirometer 13.Hyperlipidemia.Crestor 14.Obesity.BMI 34.31.Dietery follow up 15.  Diabetes mellitus.hemoglobin AIc 5.2.  CBGs 211-310. Increase Lantus to 13U BID.   16. Leukocytosis: trending down, repeat monday 17. Constipation: KUB shows moderate stool burden. Increase senna-docusate to BID.   18. Fistula placement: successful, new fistula accessed without complications.  19. Left shoulder pain: ordered lidocaine patch and voltaren gel.     LOS: 12 days A FACE TO FACE EVALUATION WAS PERFORMED  Carolyn Russo 05/02/2021, 6:02 PM

## 2021-05-02 NOTE — Progress Notes (Signed)
[facility] Progress Note   Subjective:   last HD on 7/9 with 1.5 kg UF.  She signed off early from HD per nursing.  Spoke with staff and they are going to correct the restricted extremity sign to reflect that the right arm is restricted, not left.  She has been sleepy.  States had nausea on HD which is why she signed off.  Never got losartan on 7/9 and missed am coreg, too.   Review of systems:   Some shortness of breath  Nausea on HD - better today    Objective Vitals:   05/01/21 1950 05/01/21 2120 05/02/21 0435 05/02/21 0457  BP: (!) 155/80  (!) 161/70   Pulse: 80  72   Resp: 18  16   Temp: 97.7 F (36.5 C)  97.9 F (36.6 C)   TempSrc: Oral  Oral   SpO2: 100% 98% 99%   Weight:    80.3 kg  Height:       Physical Exam   General adult female in bed in no acute distress HEENT normocephalic atraumatic extraocular movements intact sclera anicteric Neck supple trachea midline Lungs clear to auscultation bilaterally normal work of breathing at rest  Heart S1S2 no rub Abdomen soft nontender nondistended Extremities no pitting edema  Psych normal mood and affect Neuro - hard of hearing. Sleepy today Access: RIJ tunn HD catheter and RUE AVF with bruit   Filed Weights   05/01/21 1611 05/01/21 1840 05/02/21 0457  Weight: 81.9 kg 80.4 kg 80.3 kg    Intake/Output Summary (Last 24 hours) at 05/02/2021 1021 Last data filed at 05/01/2021 1840 Gross per 24 hour  Intake 240 ml  Output 1518 ml  Net -1278 ml    Additional Objective Labs: Basic Metabolic Panel: Recent Labs  Lab 04/27/21 1017 04/29/21 1325 04/30/21 0508 05/01/21 0553  NA 130* 129* 131* 132*  K 4.9 4.9 4.4 4.2  CL 96* 94* 97* 98  CO2 25 26 25 27   GLUCOSE 329* 397* 287* 163*  BUN 41* 28* 21 23  CREATININE 7.15* 5.73* 4.82* 5.50*  CALCIUM 9.2 8.6* 8.9 8.8*  PHOS 4.5 4.2  --  4.7*   Liver Function Tests: Recent Labs  Lab 04/27/21 1017 04/29/21 1325 05/01/21 0553  ALBUMIN 3.1* 3.1* 2.9*     CBC: Recent Labs  Lab 04/27/21 1017 04/29/21 1325 04/30/21 0508 05/01/21 0553  WBC 9.9 12.1* 14.4* 13.0*  NEUTROABS  --   --   --  6.9  HGB 9.6* 10.0* 9.9* 9.8*  HCT 28.5* 29.5* 28.3* 28.4*  MCV 86.4 86.8 84.2 86.1  PLT 271 261 254 238    CBG: Recent Labs  Lab 04/30/21 1546 04/30/21 1625 05/01/21 1205 05/01/21 2134 05/02/21 0640  GLUCAP 287* 304* 211* 303* 155*     Medications:  sodium chloride 10 mL/hr at 04/30/21 1249    arformoterol  15 mcg Nebulization BID   aspirin EC  81 mg Oral Daily   budesonide (PULMICORT) nebulizer solution  0.5 mg Nebulization BID   carvedilol  12.5 mg Oral BID WC   Chlorhexidine Gluconate Cloth  6 each Topical BID   Chlorhexidine Gluconate Cloth  6 each Topical Q0600   [START ON 05/07/2021] darbepoetin (ARANESP) injection - DIALYSIS  100 mcg Intravenous Q Fri-HD   diclofenac Sodium  2 g Topical QID   fluticasone  2 spray Each Nare Daily   furosemide  40 mg Oral Q M,W,F   hydrocortisone  25 mg Rectal BID  insulin glargine  13 Units Subcutaneous BID   ketotifen  2 drop Both Eyes BID   lidocaine  1 patch Transdermal Q24H   losartan  25 mg Oral Daily   pantoprazole  40 mg Oral BID   rosuvastatin  20 mg Oral Daily   senna-docusate  2 tablet Oral BID   sevelamer carbonate  0.8 g Oral TID WC    Dialysis Orders: [facility]-MWF 4 hrs 180NRe 400/Autoflow 1.5 86.5 kg 2.0K/2.0 Ca TDC -heparin 2000 units IV TIW -Mircera 100 mcg IV q 2 weeks (last dose 04/14/2021 -Hectorol 5 mcg IV TIW   Assessment/Plan: Acute subcortical R CVA-per Primary/Neurology.  In CIR.  Acute LGIB- Hx hemorrhoidal bleeding in past. new onset 06/25 post clopidogrel load. s/p 2 units PRBCS on 04/18/21.  Clopidogrel and ASA on hold. No heparin with HD. Per GI: EGD and flex sig with no etiology and bleeding scan (-). ESRD -  MWF schedule as an outpatient.  Off heparin d/t new GIB. Has been HD TTS this week given surgery planned today then resume MWF next week.  S/p right  first stage basilic vein transposition on 7/8 with vascular.  Hypertension/volume - Optimize volume with HD.  Missed some meds on 7/9.  Change losartan to evening administration  Anemia CKD - As noted in # 2. aranesp 184mcg weekly - changed to [place]. Transfuse PRN. Metabolic bone disease -  PTH only 110 03/26/21 on 5 mcg Hectorol, hectorol was held at that time.  Rechecked pth 128. No need to restart VDRA.  Changed renvela to powder d/t patient inability to swallow pills.  Nutrition - note dysphagia diet DTM2- per primary Pleuritic abdominal/chest pain: improved  Caution with sedating meds.   Claudia Desanctis, MD [facility] 05/02/2021, 10:36 AM

## 2021-05-02 NOTE — Progress Notes (Signed)
Physical Therapy Session Note  Patient Details  Name: Carolyn Russo MRN: [id number] Date of Birth: Mar 15, 1951  Today's Date: 05/02/2021 PT Individual Time: 1000-1030 PT Individual Time Calculation (min): 30 min   Short Term Goals: Week 2:  PT Short Term Goal 1 (Week 2): =LTG due to ELOS  Skilled Therapeutic Interventions/Progress Updates:    Pt received supine in bed asleep, arousable and agreeable to PT session. Pt reports ongoing pain under L breast, not rated and declines intervention. Pt remains very lethargic during session and frequently closes her eyes, requires increased time and cues to stay awake for therapy session. Pt agreeable to get dressed and attempt to get up to w/c this session. Supine to sit with mod A needed due to pt frequently falling asleep during transfer. Pt with fair sitting balance EOB due to falling asleep, requires cues to open eyes and attend to balance and therapy tasks. Pt is dependent to don TED hose, socks, and pants while seated EOB. Pt is total A to doff hospital gown and don shirt while seated EOB. Sit to stand with mod A to RW, total A to pull pants up over hips. Pt requests to lay back down due to fatigue once dressed. Sit to supine CGA. Assisted pt with repositioning once supine in bed. Pt left seated in bed with needs in reach, bed alarm in place. Pt missed 30 min of scheduled skilled therapy session due to fatigue this AM and inability to stay awake to functionally participate in session.  Therapy Documentation Precautions:  Precautions Precautions: Fall Restrictions Weight Bearing Restrictions: No General: PT Amount of Missed Time (min): 30 Minutes PT Missed Treatment Reason: Patient fatigue   Therapy/Group: Individual Therapy   Excell Seltzer, PT, DPT, CSRS  05/02/2021, 10:52 AM

## 2021-05-02 NOTE — Anesthesia Postprocedure Evaluation (Signed)
Anesthesia Post Note  Patient: Carolyn Russo  Procedure(s) Performed: RIGHT FIRST STAGE BASCILIC VEIN TRANSPOSITION (Right: Arm Upper)     Patient location during evaluation: PACU Anesthesia Type: Regional and MAC Level of consciousness: awake and alert Pain management: pain level controlled Vital Signs Assessment: post-procedure vital signs reviewed and stable Respiratory status: spontaneous breathing, nonlabored ventilation, respiratory function stable and patient connected to nasal cannula oxygen Cardiovascular status: stable and blood pressure returned to baseline Postop Assessment: no apparent nausea or vomiting Anesthetic complications: no   No notable events documented.  Last Vitals:  Vitals:   05/02/21 0435 05/02/21 1328  BP: (!) 161/70 (!) 122/53  Pulse: 72 67  Resp: 16 16  Temp: 36.6 C (!) 36.4 C  SpO2: 99% 100%    Last Pain:  Vitals:   05/02/21 1328  TempSrc: Oral  PainSc:                  Daisy Lites

## 2021-05-03 ENCOUNTER — Inpatient Hospital Stay (HOSPITAL_COMMUNITY): Payer: Medicare Other

## 2021-05-03 ENCOUNTER — Encounter (HOSPITAL_COMMUNITY): Payer: Self-pay | Admitting: Vascular Surgery

## 2021-05-03 LAB — CBC WITH DIFFERENTIAL/PLATELET
Abs Immature Granulocytes: 0.04 10*3/uL (ref 0.00–0.07)
Basophils Absolute: 0.1 10*3/uL (ref 0.0–0.1)
Basophils Relative: 1 %
Eosinophils Absolute: 0.2 10*3/uL (ref 0.0–0.5)
Eosinophils Relative: 2 %
HCT: 29.5 % — ABNORMAL LOW (ref 36.0–46.0)
Hemoglobin: 9.7 g/dL — ABNORMAL LOW (ref 12.0–15.0)
Immature Granulocytes: 0 %
Lymphocytes Relative: 27 %
Lymphs Abs: 3.5 10*3/uL (ref 0.7–4.0)
MCH: 29.1 pg (ref 26.0–34.0)
MCHC: 32.9 g/dL (ref 30.0–36.0)
MCV: 88.6 fL (ref 80.0–100.0)
Monocytes Absolute: 1.1 10*3/uL — ABNORMAL HIGH (ref 0.1–1.0)
Monocytes Relative: 9 %
Neutro Abs: 7.9 10*3/uL — ABNORMAL HIGH (ref 1.7–7.7)
Neutrophils Relative %: 61 %
Platelets: 299 10*3/uL (ref 150–400)
RBC: 3.33 MIL/uL — ABNORMAL LOW (ref 3.87–5.11)
RDW: 15.7 % — ABNORMAL HIGH (ref 11.5–15.5)
WBC: 12.8 10*3/uL — ABNORMAL HIGH (ref 4.0–10.5)
nRBC: 0 % (ref 0.0–0.2)

## 2021-05-03 LAB — RENAL FUNCTION PANEL
Albumin: 2.8 g/dL — ABNORMAL LOW (ref 3.5–5.0)
Anion gap: 9 (ref 5–15)
BUN: 21 mg/dL (ref 8–23)
CO2: 25 mmol/L (ref 22–32)
Calcium: 8.6 mg/dL — ABNORMAL LOW (ref 8.9–10.3)
Chloride: 97 mmol/L — ABNORMAL LOW (ref 98–111)
Creatinine, Ser: 5.9 mg/dL — ABNORMAL HIGH (ref 0.44–1.00)
GFR, Estimated: 7 mL/min — ABNORMAL LOW (ref >60)
Glucose, Bld: 306 mg/dL — ABNORMAL HIGH (ref 70–99)
Phosphorus: 4.8 mg/dL — ABNORMAL HIGH (ref 2.5–4.6)
Potassium: 4.8 mmol/L (ref 3.5–5.1)
Sodium: 131 mmol/L — ABNORMAL LOW (ref 135–145)

## 2021-05-03 LAB — GLUCOSE, CAPILLARY
Glucose-Capillary: 181 mg/dL — ABNORMAL HIGH (ref 70–99)
Glucose-Capillary: 234 mg/dL — ABNORMAL HIGH (ref 70–99)
Glucose-Capillary: 229 mg/dL — ABNORMAL HIGH (ref 70–99)
Glucose-Capillary: 263 mg/dL — ABNORMAL HIGH (ref 70–99)

## 2021-05-03 IMAGING — CT CT HEAD W/O CM
4 series · 16 of 47 positions shown, 18 images · non-contrast
Comparison: [DATE]

CLINICAL DATA: Mental status changes of unknown cause.

EXAM:
CT HEAD WITHOUT CONTRAST
TECHNIQUE: Contiguous axial images were obtained from the base of the skull
through the vertex without intravenous contrast.

[Series 3: head wo · axial · 0.42mm/px · z∈[-240,-130]mm · 7 of 30 slices shown, 9 images]
[im 4/30  brain]
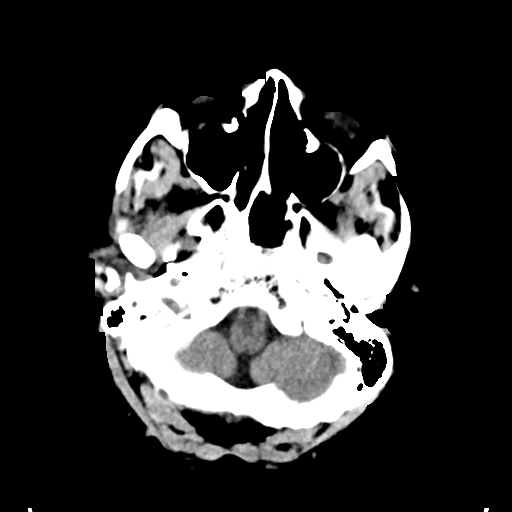
[im 4/30  bone]
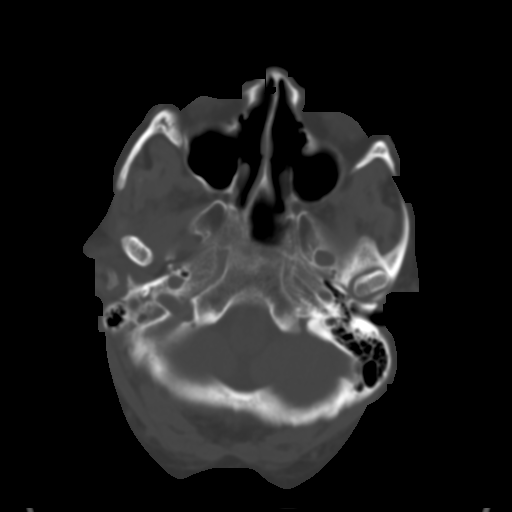
[im 8/30  brain]
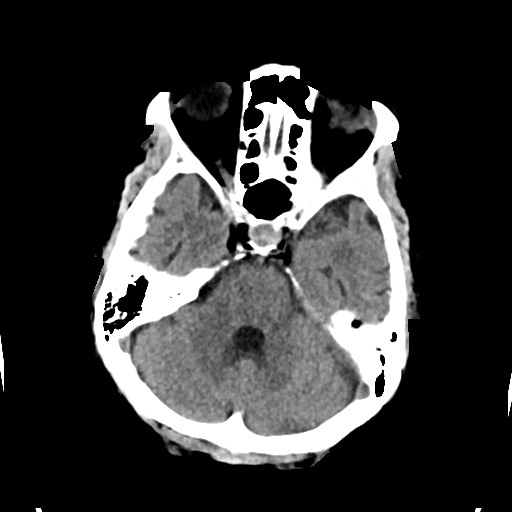
[im 11/30  brain]
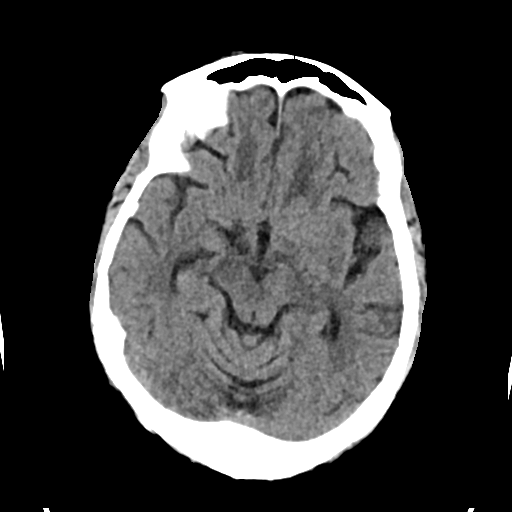
[im 15/30  brain]
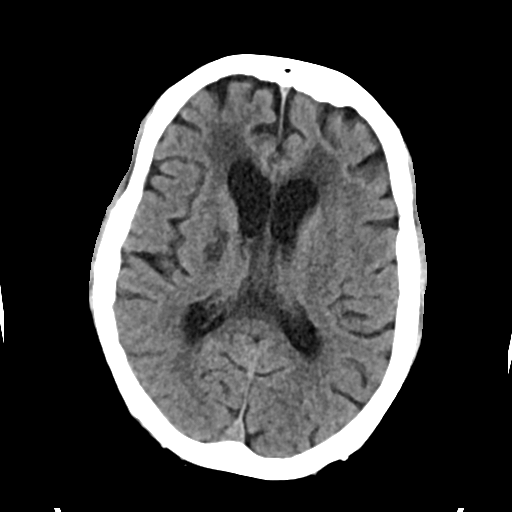
[im 19/30  brain]
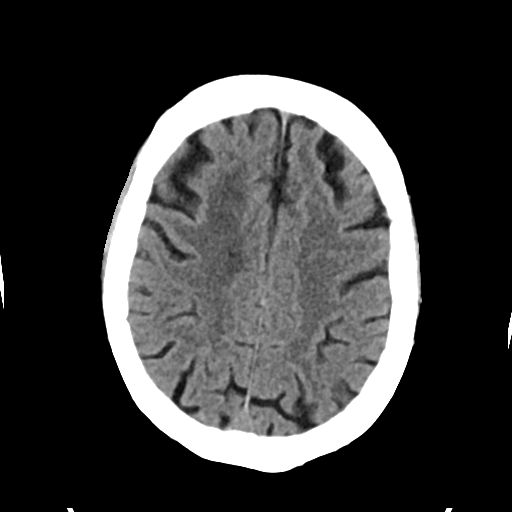
[im 19/30  bone]
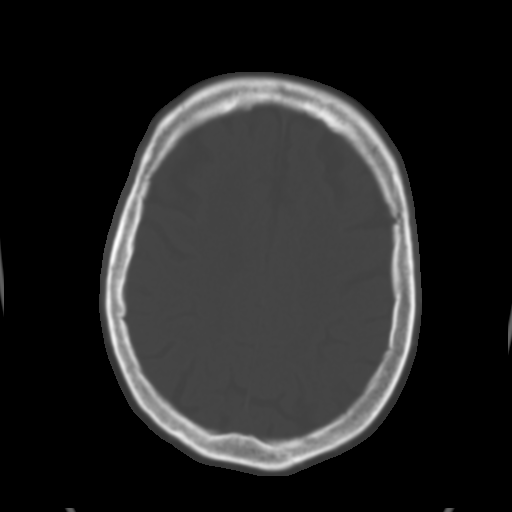
[im 22/30  brain]
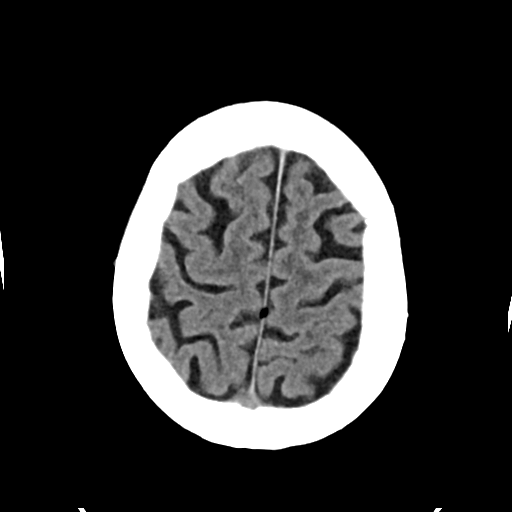
[im 26/30  brain]
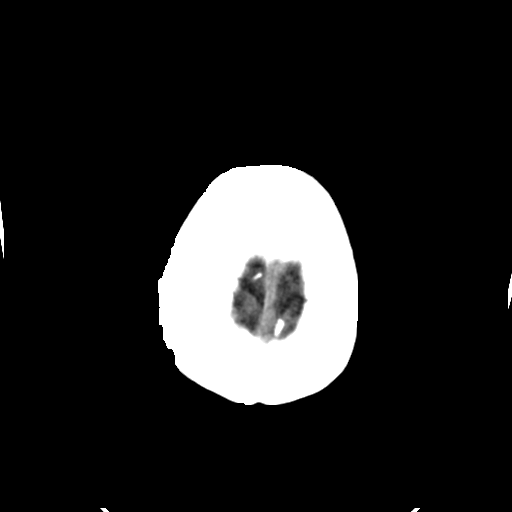

[Series 4: head bone · axial · 0.42mm/px · z∈[-242,-214]mm · 3 of 74 slices shown]
[im 8/74  bone]
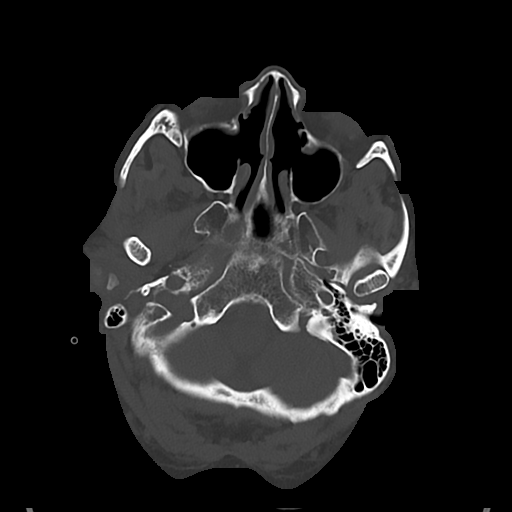
[im 15/74  bone]
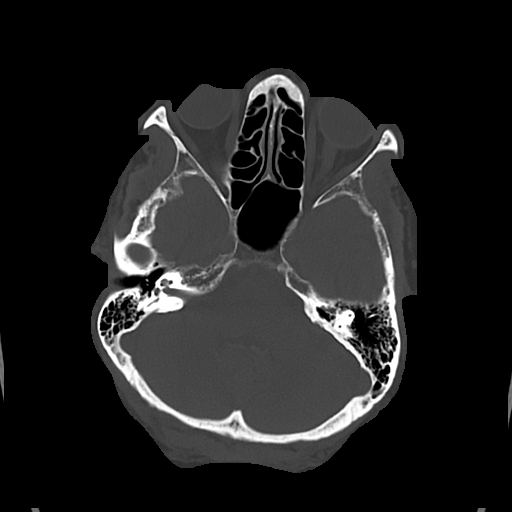
[im 22/74  bone]
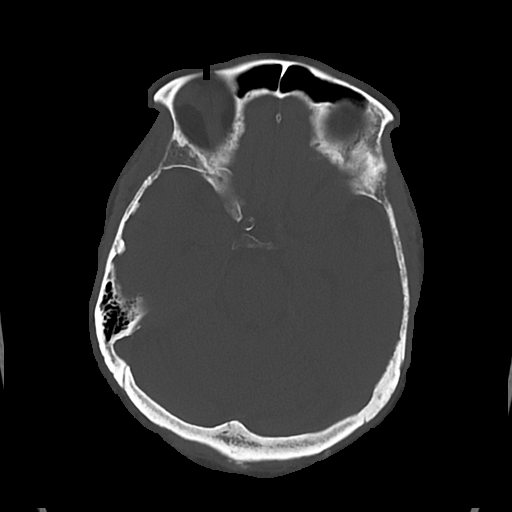

[Series 5: cor soft · coronal · 0.35mm/px · 3 of 70 slices shown]
[im 24/70  brain]
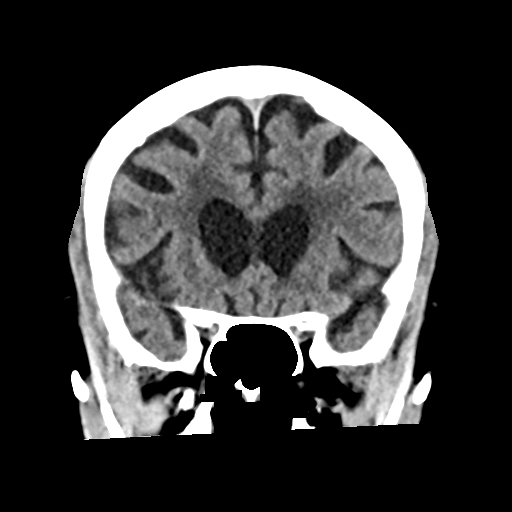
[im 31/70  brain]
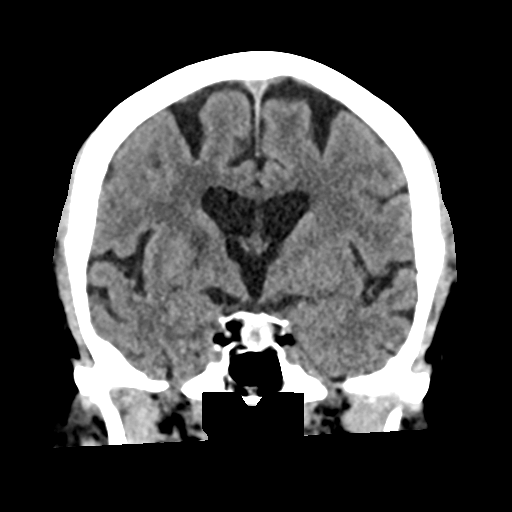
[im 39/70  brain]
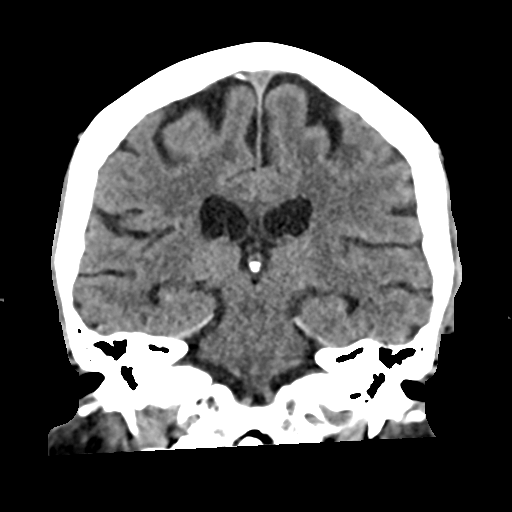

[Series 6: sag soft · sagittal · 0.33mm/px · 3 of 60 slices shown]
[im 20/60  brain]
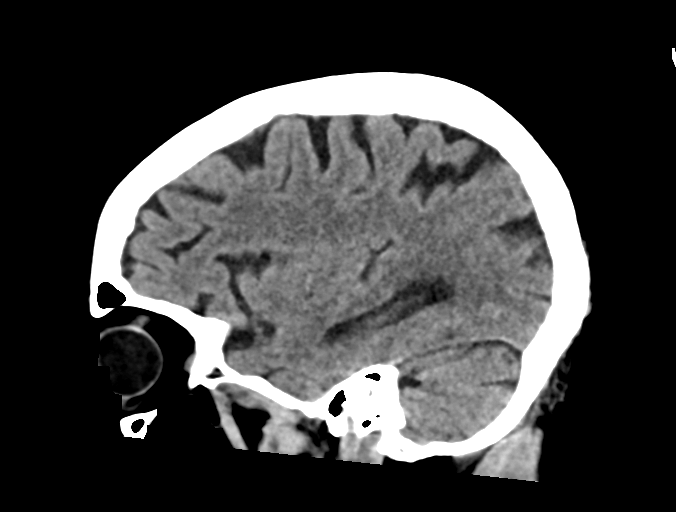
[im 30/60  brain]
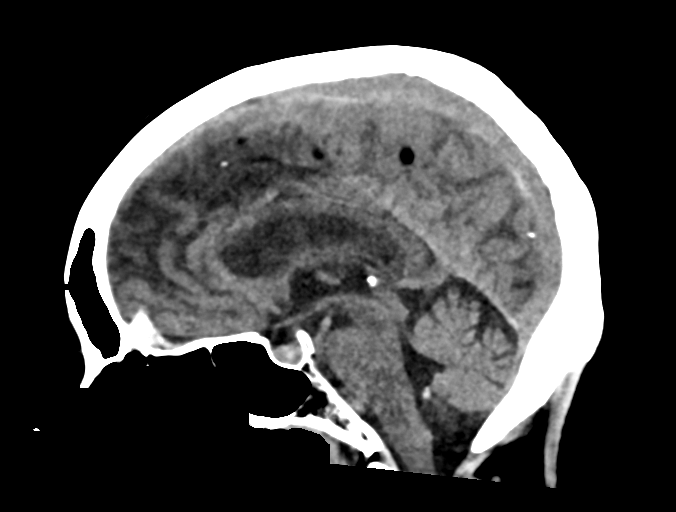
[im 40/60  brain]
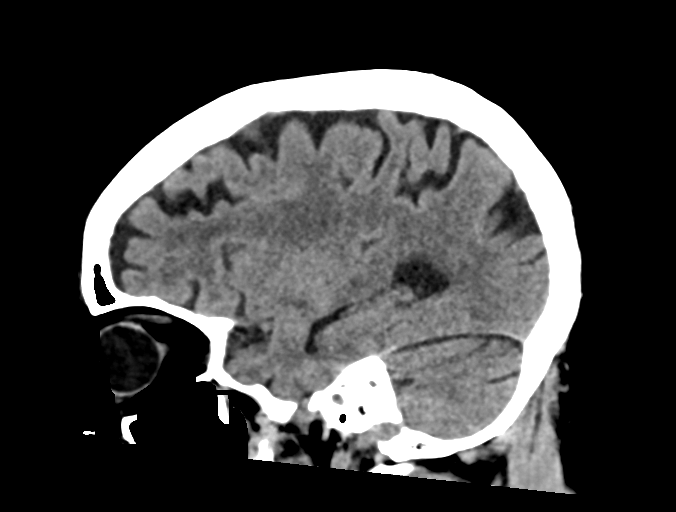

[16 of 47 positions shown; findings below may reference images not displayed]

FINDINGS: Brain: No focal abnormality seen affecting the brainstem or
cerebellum. Subacute infarction in the right lateral
thalamus/posterior limb internal capsule is more clearly visible. No
evidence of extension. Chronic small-vessel ischemic changes
elsewhere throughout the cerebral hemispheric white matter as seen
previously. No sign of new insult, mass, hemorrhage, hydrocephalus
or extra-axial collection.

Vascular: There is atherosclerotic calcification of the major
vessels at the base of the brain.

Skull: Negative

Sinuses/Orbits: Clear sinuses. Previous functional endoscopic sinus
surgery. Orbits negative.

Other: None
IMPRESSION: No acute CT finding. Subacute infarction in the right lateral
thalamus/radiating white matter tracts is more clearly visible but
there is no sign of extension or hemorrhage. Chronic small-vessel
ischemic changes elsewhere affecting the cerebral hemispheric white
matter.

## 2021-05-03 IMAGING — CR DG CHEST 2V
2 series · 2 of 2 positions shown · non-contrast
Comparison: Chest radiograph dated [DATE].

CLINICAL DATA: 70-year-old female with shortness of breath.

EXAM:
CHEST - 2 VIEW

[chest ap]
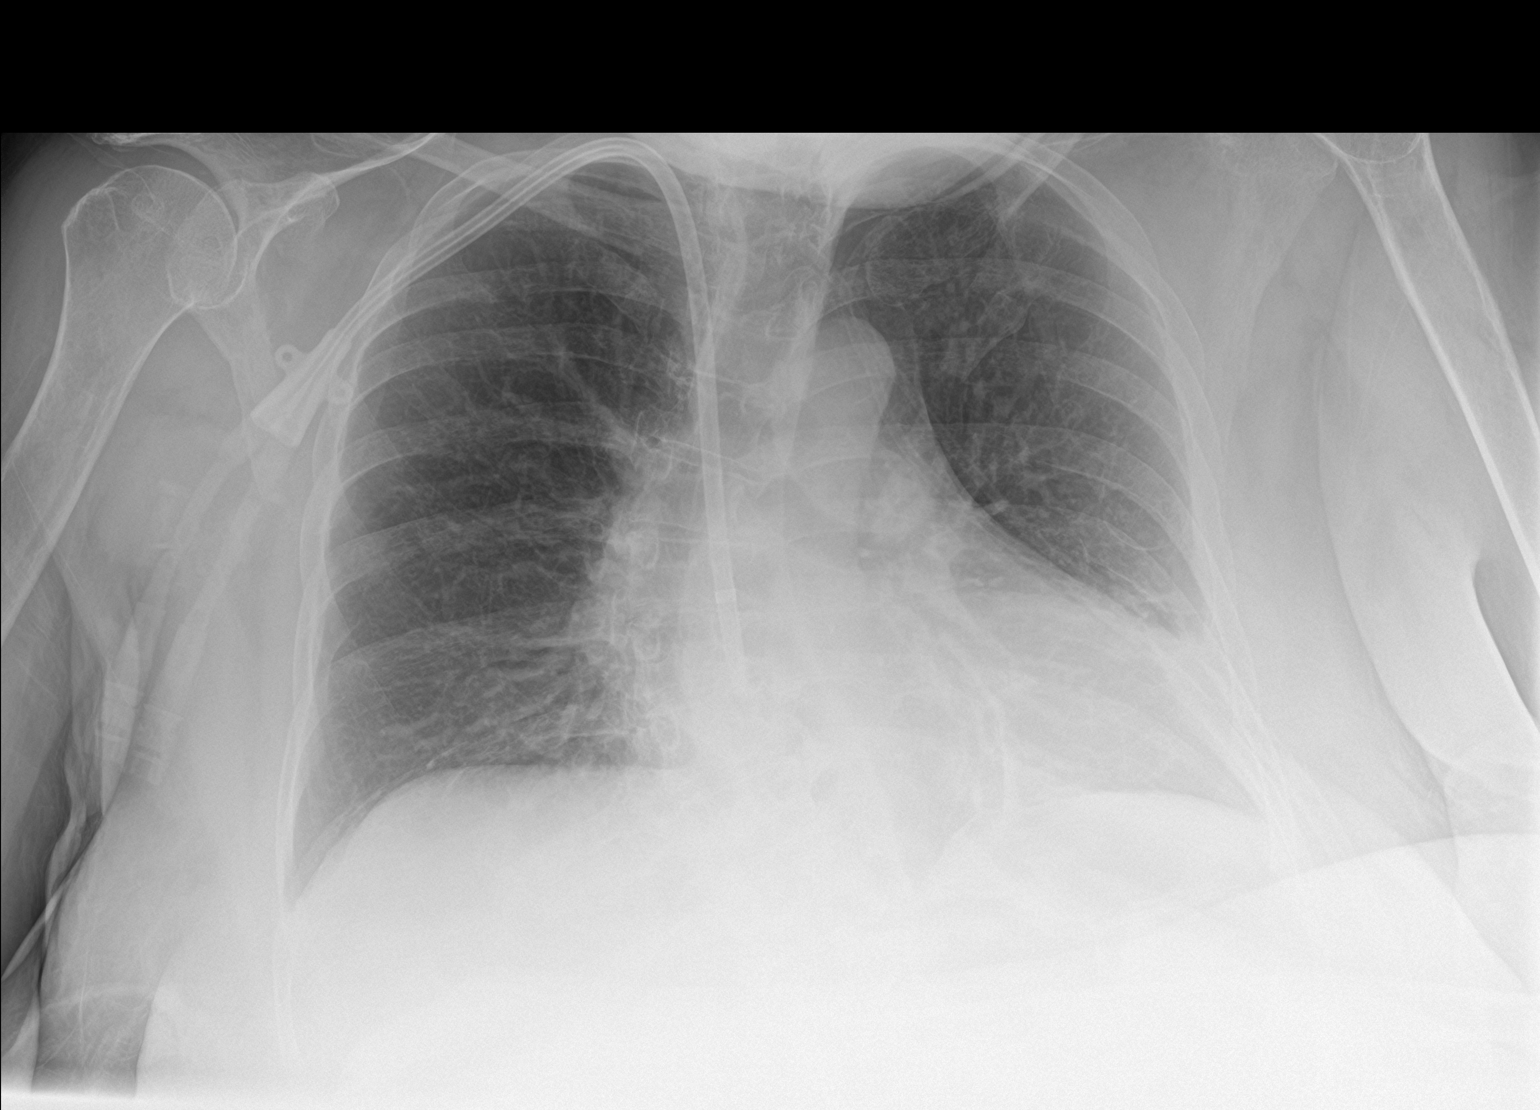

[chest lat]
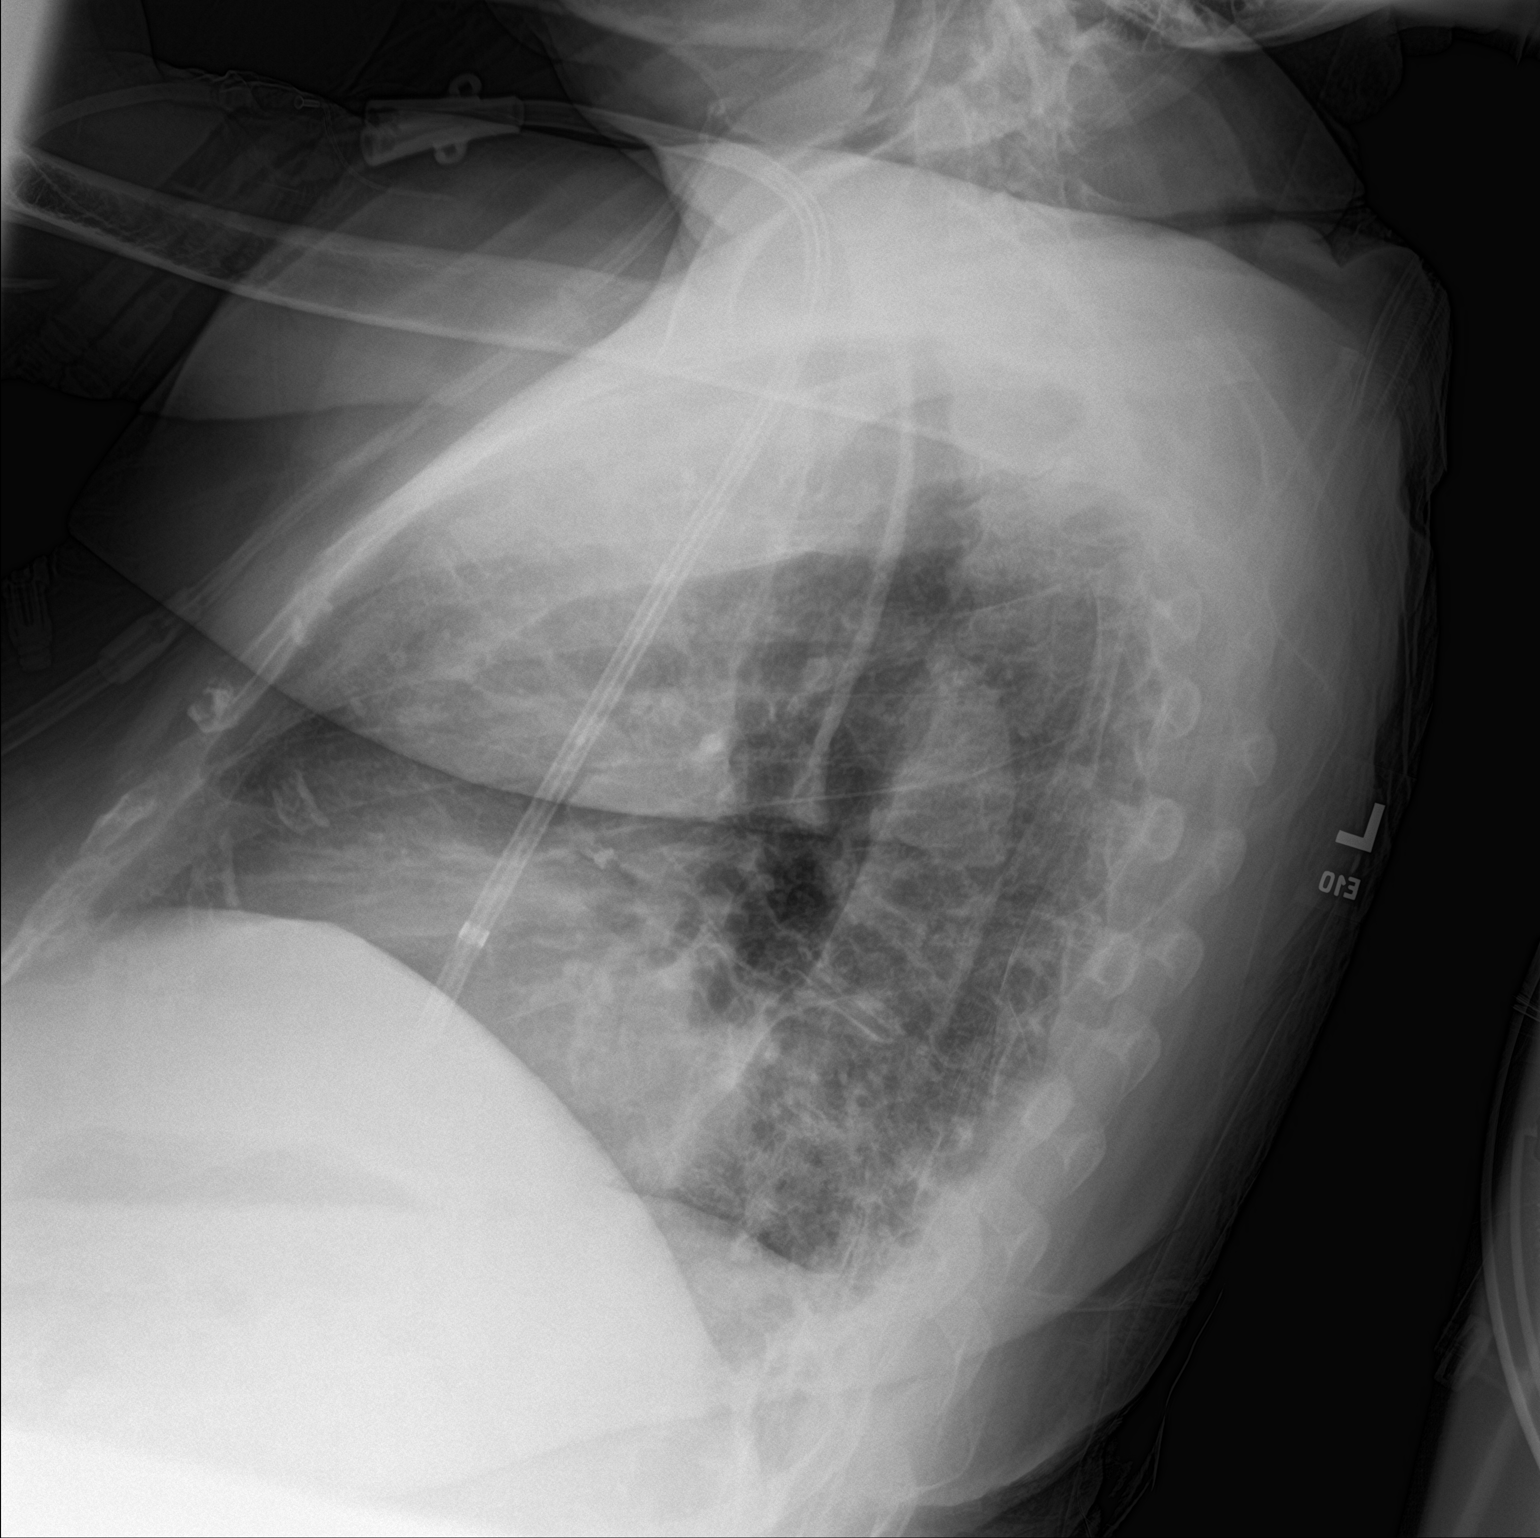

[2 of 2 positions shown; findings below may reference images not displayed]

FINDINGS: Dialysis catheter with tip in similar position close to the
cavoatrial junction. No focal consolidation, pleural effusion or
pneumothorax. Left lung base atelectasis. Stable cardiac silhouette.
No acute osseous pathology. Degenerative changes of the spine.
IMPRESSION: No active cardiopulmonary disease.

## 2021-05-03 MED ORDER — LIDOCAINE 5 % EX PTCH
2.0000 | MEDICATED_PATCH | CUTANEOUS | Status: DC
  Administered 2021-05-03: 2 via TRANSDERMAL
  Administered 2021-05-04: 1 via TRANSDERMAL
  Filled 2021-05-03 (×3): qty 2

## 2021-05-03 MED ORDER — HEPARIN SODIUM (PORCINE) 1000 UNIT/ML IJ SOLN
INTRAMUSCULAR | Status: AC
  Filled 2021-05-03: qty 4

## 2021-05-03 MED ORDER — ONDANSETRON HCL 4 MG PO TABS
4.0000 mg | ORAL_TABLET | Freq: Three times a day (TID) | ORAL | Status: DC | PRN
  Administered 2021-05-04 – 2021-05-05 (×2): 4 mg via ORAL
  Filled 2021-05-03 (×3): qty 1

## 2021-05-03 MED ORDER — ONDANSETRON HCL 4 MG PO TABS
4.0000 mg | ORAL_TABLET | ORAL | Status: DC
  Administered 2021-05-05: 4 mg via ORAL
  Filled 2021-05-03: qty 1

## 2021-05-03 MED ORDER — MILK AND MOLASSES ENEMA
1.0000 | Freq: Once | RECTAL | Status: AC
  Administered 2021-05-03: 240 mL via RECTAL
  Filled 2021-05-03: qty 240

## 2021-05-03 NOTE — Progress Notes (Signed)
Occupational Therapy Session Note  Patient Details  Name: Carolyn Russo MRN: [id number] Date of Birth: [birth year]  Today's Date: 05/03/2021 OT Individual Time: 7544-9201 and 11:35-11:40 OT Individual Time Calculation (min): 39 min  and Today's Date: 05/03/2021 OT Missed Time: 21 Minutes + 30 mins Missed Time Reason: Patient fatigue   Short Term Goals: Week 1:  OT Short Term Goal 1 (Week 1): Pt transfers to toilet wiht MIN A OT Short Term Goal 1 - Progress (Week 1): Met OT Short Term Goal 2 (Week 1): Pt will stand to groom at sink wiht MIN A OT Short Term Goal 2 - Progress (Week 1): Met OT Short Term Goal 3 (Week 1): Pt will don shirt wiht MIN A OT Short Term Goal 3 - Progress (Week 1): Met OT Short Term Goal 4 (Week 1): Pt will recall hemi dressing strategies wiht min VC OT Short Term Goal 4 - Progress (Week 1): Met  Skilled Therapeutic Interventions/Progress Updates:     Session 1: 9:15-9:54: Pt received in room and consented to OT tx. Pt appears very lethargic and has c/o nausea, however she is still agreeable to therapy as tolerated. Pt wheeled down to gym, instructed in card matching game while standing at [place]. Pt requires cuing to push up from w/c and place hands on RW.Handed pt card on R side, mild ataxia noted on her R side when reaching for card. Pt instructed in visual scanning task, however after a few minutes, reports she is not able to find the card, even with cuing. Pt demo's delayed responses to questions and instruction throughout entire session, and closing eyes, appears to be falling asleep even when standing up. Pt sat back down with vitals stable, BP taken on L forearm 131/72, and Pam, PA notified to come check her out and is now aware of pt status. Therapy session terminated as pt was unable to keep her eyes open during session 2/2 lethargy. Pt helped back to bed with CGA and left semifolwer in bed with all needs met and alarm on. Pt fell asleep and started snoring in less  than 10 seconds of getting back to bed. Currently missing 21 minutes due to fatigue. Will attempt to make up time later in the day.   Session 2:  Pt received sleeping in bed. Pt woken up for therapy, however unable to remain awake for short questions and instruction for starting therapy. Missing 30 minutes due to continued lethargy. Will reattempt as scheduling allows.   Therapy Documentation Precautions:  Precautions Precautions: Fall Restrictions Weight Bearing Restrictions: No  Pain: none      Therapy/Group: Individual Therapy  Egor Fullilove 05/03/2021, 7:42 AM

## 2021-05-03 NOTE — Progress Notes (Signed)
Vascular was called to evaluate Carolyn Russo's right arm after AV fistula placement on Friday with Dr. Scot Dock.  On 04/30/2021 she had a right first stage basilic vein transposition.  She reports after the block wore off on Saturday she developed some numbness in the hand and then it has felt somewhat clumsy.  On exam the hand is warm.  She has a palpable 1+ radial pulse at the wrist.  There is a thrill in the fistula.  On my exam she appears to have fairly equal grip strength although subjectively does feel like the hand is clumsy at times.  Numbness is in the distal 5 digits.    I did discuss with her possibility of underlying steal syndrome especially with a patent fistula could be stealing flow from the hand.  I offered her the option of ligation of the fistula.  Ultimately she states she would like to continue to observe her symptoms and does not want any further surgery.  Please let vascular surgery know if she has any further changes on exam.  She does have follow-up with [place] on 8/17 for ultrasound duplex after first stage and discussed we could continue to monitor her symptoms at that time if she decides to ultimately undergo ligation of the fistula and not have it transposed for a second operation which she will ultimately need before it becomes functional.                                              Marty Heck, MD [facility] [place] Office: [place]

## 2021-05-03 NOTE — Progress Notes (Signed)
Physical Therapy Session Note  Patient Details  Name: Carolyn Russo MRN: [id number] Date of Birth: 30-Dec-1950  Today's Date: 05/03/2021 PT Individual Time: 0800-0900 PT Individual Time Calculation (min): 60 min   Short Term Goals: Week 2:  PT Short Term Goal 1 (Week 2): =LTG due to ELOS  Skilled Therapeutic Interventions/Progress Updates:    Pt received seated in bed eating breakfast with Supervision assist from NT, this therapist takes over. Patient is feeding herself with use of RUE, appears to struggle to grasp spoon at times with RUE. Pt also requires cues to utilize LUE to stabilize bowl, plate, etc when scooping with RUE. Seated in bed to sitting EOB with Supervision, HOB elevated and use of bedrail. Assisted pt with donning pants, knee high TEDs, and shoes while seated EOB. Pt is min A to doff hospital gown and don shirt while seated EOB. Sit to stand with min to mod A to RW throughout session. Ambulation to sink with RW and min A. Attempt to have pt perform oral hygiene in standing at sink, unable to maintain balance without BUE support. While seated in w/c at sink pt is min A for oral hygiene, requires cues for sequencing. Provided patient with styrofoam cup in order to rinse her mouth, patient bites down on cup, requires cues for correct usage. Ambulation x 20 ft with RW with min to mod A for balance before requesting to sit due to fatigue. Pt exhibits decreased overall function this date, PA notified of change in function. Pt left seated in w/c in room with needs in reach, quick release belt and chair alarm in place at end of session.  Therapy Documentation Precautions:  Precautions Precautions: Fall Restrictions Weight Bearing Restrictions: No   Therapy/Group: Individual Therapy  Excell Seltzer, PT, DPT, CSRS  05/03/2021, 12:19 PM

## 2021-05-03 NOTE — Progress Notes (Signed)
Hemodialysis- Patient signed off treatment with approx 55 minutes remaining. Continue to discuss the importance of completing all treatments. Report called to primary RN.

## 2021-05-03 NOTE — Progress Notes (Signed)
[facility] Progress Note   Subjective:   Patient seen and examined at bedside in dialysis. Reports dull RUQ pain on and off all day with nausea.  Denies vomiting, diarrhea, weakness, dizziness, CP and SOB.  No bleeding per patient.   Objective Vitals:   05/02/21 1955 05/02/21 2115 05/03/21 0330 05/03/21 0826  BP: (!) 110/93  (!) 178/48   Pulse: 71  68 68  Resp: 18  18 18   Temp: 98 F (36.7 C)  97.7 F (36.5 C)   TempSrc: Oral  Oral   SpO2: 100% 100% 100%   Weight:   81.9 kg   Height:       Physical Exam General:well appearing, elderly female in NAD Heart:RRR, no mrg Lungs:CTAB, nml WOB Abdomen:soft, +RUQ tenderness, no guarding Extremities:no LE edema Dialysis Access: R IJ TDC in use, RU AVF +b/t  Filed Weights   05/01/21 1840 05/02/21 0457 05/03/21 0330  Weight: 80.4 kg 80.3 kg 81.9 kg    Intake/Output Summary (Last 24 hours) at 05/03/2021 1336 Last data filed at 05/03/2021 0803 Gross per 24 hour  Intake 320 ml  Output 1 ml  Net 319 ml    Additional Objective Labs: Basic Metabolic Panel: Recent Labs  Lab 04/27/21 1017 04/29/21 1325 04/30/21 0508 05/01/21 0553  NA 130* 129* 131* 132*  K 4.9 4.9 4.4 4.2  CL 96* 94* 97* 98  CO2 25 26 25 27   GLUCOSE 329* 397* 287* 163*  BUN 41* 28* 21 23  CREATININE 7.15* 5.73* 4.82* 5.50*  CALCIUM 9.2 8.6* 8.9 8.8*  PHOS 4.5 4.2  --  4.7*   Liver Function Tests: Recent Labs  Lab 04/27/21 1017 04/29/21 1325 05/01/21 0553  ALBUMIN 3.1* 3.1* 2.9*   CBC: Recent Labs  Lab 04/27/21 1017 04/29/21 1325 04/30/21 0508 05/01/21 0553  WBC 9.9 12.1* 14.4* 13.0*  NEUTROABS  --   --   --  6.9  HGB 9.6* 10.0* 9.9* 9.8*  HCT 28.5* 29.5* 28.3* 28.4*  MCV 86.4 86.8 84.2 86.1  PLT 271 261 254 238     Medications:  sodium chloride 10 mL/hr at 04/30/21 1249    arformoterol  15 mcg Nebulization BID   aspirin EC  81 mg Oral Daily   budesonide (PULMICORT) nebulizer solution  0.5 mg Nebulization BID    carvedilol  12.5 mg Oral BID WC   Chlorhexidine Gluconate Cloth  6 each Topical Q0600   clopidogrel  75 mg Oral Daily   [START ON 05/07/2021] darbepoetin (ARANESP) injection - DIALYSIS  100 mcg Intravenous Q Fri-HD   diclofenac Sodium  2 g Topical QID   fluticasone  2 spray Each Nare Daily   furosemide  40 mg Oral Q M,W,F   hydrocortisone  25 mg Rectal BID   insulin glargine  13 Units Subcutaneous BID   ketotifen  2 drop Both Eyes BID   lidocaine  1 patch Transdermal Q24H   losartan  25 mg Oral QPM   pantoprazole  40 mg Oral BID   rosuvastatin  20 mg Oral Daily   senna-docusate  2 tablet Oral BID   sevelamer carbonate  0.8 g Oral TID WC    Dialysis Orders: [facility]-MWF 4 hrs 180NRe 400/Autoflow 1.5 86.5 kg 2.0K/2.0 Ca TDC -heparin 2000 units IV TIW -Mircera 100 mcg IV q 2 weeks (last dose 04/14/2021 -Hectorol 5 mcg IV TIW   Assessment/Plan: Acute subcortical R CVA-per Primary/Neurology.  In CIR. Acute LGIB- Hx hemorrhoidal bleeding in past. new onset 06/25  post clopidogrel load. s/p 2 units PRBCS on 04/18/21.  Clopidogrel and ASA on hold. No heparin with HD. Per GI: EGD and flex sig with no etiology and bleeding scan (-).  Last Hgb 9.8, checking again today.  ESRD -  MWF schedule as an outpatient.  Off heparin d/t new GIB. Off schedule last week due to surgery.  Now back on schedule.  1st stage R basilic vein transposition on 7/8 by Dr. Scot Dock.   Hypertension/volume - Losartan changed to PM dosing.  BP currently in goal on HD.  Does not appear volume overloaded.  Under dry weight, will need to be lowered on d/c.  Anemia CKD - As noted in # 2. aranesp 162mcg weekly - changed to [place]. Transfuse PRN. Metabolic bone disease -  PTH only 110 03/26/21 on 5 mcg Hectorol, hectorol was held at that time.  Rechecked pth 128. No need to restart VDRA.  Changed renvela to powder d/t patient inability to swallow pills.  Nutrition - note dysphagia diet DTM2- per primary Pleuritic abdominal/chest pain:  improved  Jen Mow, PA-C [facility] 05/03/2021,1:36 PM  LOS: 13 days

## 2021-05-03 NOTE — Progress Notes (Addendum)
Progress per nursing patient has been refusing to complete dialysis sessions--07/07, 07/09 and then again today. She reports not sleeping well but was unable to keep eyes open for more than a few seconds and reported to be lethargic today. She had some difficulty with processing with PT but none noted with follow up OT session. Question uremic symptoms.   Discussed patient with Dr. Chauncey Fischer head added for follow up films as Plavix added yesterday. Followed up with patient on HD refusals and she reports having abdominal pain, right hip discomfort as well as nausea affecting her tolerance. On exam, pain is under left breast and reproducible--appears to be costochondral but she also reports SOB. She has had issues with constipation--enema ordered to see if this will relieve some of her abdominal symptoms. We discussed that stopping HD early for last 3 sessions may lead to fluid overload, uremia and slid backwards as she seems to be loosing ground. She reports that she wants to live and does not want to stop HD.  Will also order CXR for work up and will schedule zofran prior to HD sessions. Will also reach out to vascular as she is reporting coldness/numbness of right hand. +Radial pulse and but slighter cooler than left hand.  Discussed with Dr. Carlis Abbott who will evaluate for input.

## 2021-05-03 NOTE — Progress Notes (Signed)
PROGRESS NOTE   Subjective/Complaints:  Pt ate 90+% of oatmeal and 50% of applesauce.   Pt admits keeping her eyes closed is "comfortable", however doesn't feel sleepy"- but then not answering questions as easily.   LBM yesterday.    LKG:MWNUUVO due to sedation/cognition  Objective:   No results found. Recent Labs    05/01/21 0553  WBC 13.0*  HGB 9.8*  HCT 28.4*  PLT 238    Recent Labs    05/01/21 0553  NA 132*  K 4.2  CL 98  CO2 27  GLUCOSE 163*  BUN 23  CREATININE 5.50*  CALCIUM 8.8*      Intake/Output Summary (Last 24 hours) at 05/03/2021 0845 Last data filed at 05/03/2021 0803 Gross per 24 hour  Intake 380 ml  Output --  Net 380 ml        Physical Exam: Vital Signs Blood pressure (!) 178/48, pulse 68, temperature 97.7 F (36.5 C), temperature source Oral, resp. rate 18, height 5\' 2"  (1.575 m), weight 81.9 kg, SpO2 100 %.    General: awake, but a little lethargic- eating her breakfast; NT in room supervising; NAD HENT: conjugate gaze; oropharynx moist CV: regular rate; no JVD Pulmonary: CTA B/L; no W/R/R- good air movement GI: soft, NT, ND, (+)BS- c/o mild abd pain Psychiatric: appropriate Neurological: Ox3  Musculoskeletal:        General: No swelling.    Cervical back: Normal range of motion.    Comments: Mild tenderness left shoulder with ER/IR today  Skin:    General: Skin is warm and dry. Neurological:    Comments: alert, follows commands.   Fair insight and awareness. Provided biographical information. Normal language, speech slightly slurred. Mild left central 7. LUE 2 to 2+/5 deltoid, 3/5 biceps, triceps and 3+/5 wrist and HI. LLE 3-4/5 prox to distal. RUE and RLE 4-5/5 prox to distal. No sensory deficits noted--motor/sensory exam stable today.   Assessment/Plan: 1. Functional deficits which require 3+ hours per day of interdisciplinary therapy in a comprehensive inpatient  rehab setting. Physiatrist is providing close team supervision and 24 hour management of active medical problems listed below. Physiatrist and rehab team continue to assess barriers to discharge/monitor patient progress toward functional and medical goals  Care Tool:  Bathing    Body parts bathed by patient: Right arm, Left arm, Chest, Abdomen, Front perineal area, Right upper leg, Left upper leg   Body parts bathed by helper: Right lower leg, Left lower leg, Buttocks     Bathing assist Assist Level: Minimal Assistance - Patient > 75%     Upper Body Dressing/Undressing Upper body dressing   What is the patient wearing?: Pull over shirt    Upper body assist Assist Level: Minimal Assistance - Patient > 75%    Lower Body Dressing/Undressing Lower body dressing      What is the patient wearing?: Pants, Incontinence brief     Lower body assist Assist for lower body dressing: Minimal Assistance - Patient > 75%     Toileting Toileting    Toileting assist Assist for toileting: Minimal Assistance - Patient > 75%     Transfers Chair/bed transfer  Transfers assist  Chair/bed transfer assist level: Contact Guard/Touching assist     Locomotion Ambulation   Ambulation assist      Assist level: Contact Guard/Touching assist Assistive device: Walker-rolling Max distance: 115'   Walk 10 feet activity   Assist     Assist level: Contact Guard/Touching assist Assistive device: Walker-rolling   Walk 50 feet activity   Assist Walk 50 feet with 2 turns activity did not occur: Safety/medical concerns  Assist level: Contact Guard/Touching assist Assistive device: Walker-rolling    Walk 150 feet activity   Assist Walk 150 feet activity did not occur: Safety/medical concerns         Walk 10 feet on uneven surface  activity   Assist Walk 10 feet on uneven surfaces activity did not occur: Safety/medical concerns         Wheelchair     Assist  Will patient use wheelchair at discharge?:  (TBD) Type of Wheelchair: Manual    Wheelchair assist level: Dependent - Patient 0% Max wheelchair distance: 150'    Wheelchair 50 feet with 2 turns activity    Assist        Assist Level: Dependent - Patient 0%   Wheelchair 150 feet activity     Assist      Assist Level: Dependent - Patient 0%   Blood pressure (!) 178/48, pulse 68, temperature 97.7 F (36.5 C), temperature source Oral, resp. rate 18, height 5\' 2"  (1.575 m), weight 81.9 kg, SpO2 100 %.  Medical Problem List and Plan: 1.  Left side weakness secondary to nonhemorrhagic right basal ganglia infarction             -patient may shower             -ELOS/Goals: 10-12 days, mod I goals for PT, OT, SLP  -con't PT, OT and SLP 2.  Impaired mobility: -DVT/anticoagulation:  continue SCD             -antiplatelet therapy:ASA  Plavix restarted.  3. Right hip bursitis: continue Tramadol 50 mg every 12 hours as needed, ice, ROM, lidocaine patch 4. Anxiety: continue Xanax 0.5 mg 3 times daily as needed.  Provide emotional support             -antipsychotic agents: N/A 5. Neuropsych: This patient is capable of making decisions on her own behalf. 6. Skin/Wound Care: Routine skin checks 7. Fluids/Electrolytes/Nutrition: Routine in and outs with follow-up chemistries 8.  Acute lower GI bleed with blood loss anemia.  Follow-up GI services.  Resume ASA as above. - Continue Protonix 40 mg twice daily 7/6: hgb 9.6--monitor for gross bleeding  7/11- last Hb stable at 9.8- will recheck this week 9.  Chronic diastolic congestive heart failure.  Monitor for any signs of fluid overload.  Continue Lasix 40-80 mg Monday Wednesday Friday             -daily weights, have been stable 10.  Newly Dx'd End-stage renal disease.  Hemodialysis as per renal services 11.  Hypertension.  Increase Coreg to 12.5 mg twice daily. Aggressive filtration with ultradialysis tomorrow  7/11- HD today- BP  170s/90s this AM- per renal.  12.  Asthma.  Continue inhalers as directed  - was getting Home O2 prior to admission- will likely need to go home on 2L O2-   Was trying to get at home, but hadn't yet.   7/1- con't nebs prn, home O2 ?PRN only  7/3 lungs very clear today, sats 99% on RA. Some of SOB  may be behaviorally related. Reassured pt today that lungs sounded fine.   -encouraged FV/IS, OOB  7/7: ordered CXT-stable, ordered incentive spirometer 13.Hyperlipidemia.Crestor 14.Obesity.BMI 34.31.Dietery follow up 15.  Diabetes mellitus.hemoglobin AIc 5.2.  CBGs 211-310. Increase Lantus to 13U BID.   16. Leukocytosis: trending down, repeat Monday  7/11- last WBC was 13k- will recheck in HD today.  17. Constipation: KUB shows moderate stool burden. Increase senna-docusate to BID.   18. Fistula placement: successful, new fistula accessed without complications.  19. Left shoulder pain: ordered lidocaine patch and voltaren gel.     LOS: 13 days A FACE TO FACE EVALUATION WAS PERFORMED  Carolyn Russo 05/03/2021, 8:45 AM

## 2021-05-03 NOTE — Progress Notes (Signed)
Speech Language Pathology Daily Session Note  Patient Details  Name: DAYTONA HEDMAN MRN: [id number] Date of Birth: 1951-05-14  Today's Date: 05/03/2021 SLP Individual Time: 1015-1045 SLP Individual Time Calculation (min): 30 min  Short Term Goals: Week 2: SLP Short Term Goal 1 (Week 2): STG=LTG  Skilled Therapeutic Interventions:Skilled ST services focused on cognitive skills. Pt was orientated to all, except day/date. SLP posted calendar in room and pt was able to recall day/date throughout session in 5-10 minute intervals. SLP facilitated sustained attention in card sorting task by 6 colors due to noted lethargy. Pt demonstrated appropriate problem solving however due to reduced attention falling sleep in 2 minute increasing to 30 second intervals pt required max A verbal cues for sustained attention. SLP recorded events in memory notebook and pt was able to read events with mod A verbal cues for attention. Pt missed 15 minute of treatment due to falling asleep and unable to maintain alertness. Pt was left in room with call bell within reach and bed alarm set. SLP recommends to continue skilled services.     Pain Pain Assessment Pain Scale: 0-10 Pain Score: 0-No pain  Therapy/Group: Individual Therapy  Tayja Manzer  [facility] 05/03/2021, 10:48 AM

## 2021-05-04 LAB — GLUCOSE, CAPILLARY
Glucose-Capillary: 117 mg/dL — ABNORMAL HIGH (ref 70–99)
Glucose-Capillary: 178 mg/dL — ABNORMAL HIGH (ref 70–99)
Glucose-Capillary: 188 mg/dL — ABNORMAL HIGH (ref 70–99)
Glucose-Capillary: 145 mg/dL — ABNORMAL HIGH (ref 70–99)

## 2021-05-04 MED ORDER — ONDANSETRON 4 MG PO TBDP
4.0000 mg | ORAL_TABLET | Freq: Once | ORAL | Status: AC
  Administered 2021-05-04: 4 mg via ORAL
  Filled 2021-05-04 (×2): qty 1

## 2021-05-04 MED ORDER — CHLORHEXIDINE GLUCONATE CLOTH 2 % EX PADS
6.0000 | MEDICATED_PAD | Freq: Every day | CUTANEOUS | Status: DC
  Administered 2021-05-06: 6 via TOPICAL

## 2021-05-04 NOTE — Progress Notes (Signed)
Occupational Therapy Session Note  Patient Details  Name: Carolyn Russo MRN: [id number] Date of Birth: 18-May-1951  Today's Date: 05/04/2021 OT Individual Time: 1430-1443 OT Individual Time Calculation (min): 13 min    Short Term Goals: Week 2:  OT Short Term Goal 2 (Week 2): STG=LTG 2/2 ELOS   Skilled Therapeutic Interventions/Progress Updates:    Pt received in bed with emi bag in hand and consented to OT tx. Plan was initially for UB strengthening HEP, however pt request ice chips 2/2 c/o nausea. Pt asked therapist to "do it for her" but therapist utilized [facility] technique to instruct pt in proper grasp in order to feed herself ice chips. Pt demos very week B hands and decreased coordination in both hands. Pt is able to properly hold spoon when therapist correctly places spoon in her hand, encouraged to keep practicing self feeding. Pt then instructed in 2# dowel rod exercises, but pt closing eyes and unable to remain aroused for UE strengthening exercises despite max cuing. After tx, left pt in bed and RN present to administer nausea meds.   Therapy Documentation Precautions:  Precautions Precautions: Fall Restrictions Weight Bearing Restrictions: No General:   Vital Signs: Therapy Vitals Temp: 97.7 F (36.5 C) Temp Source: Oral Pulse Rate: 73 Resp: 20 BP: (!) 158/53 Patient Position (if appropriate): Lying Oxygen Therapy SpO2: 100 % O2 Device: Room Air Pain: Pain Assessment Pain Score: 0-No pain    Therapy/Group: Individual Therapy  Colm Lyford 05/04/2021, 2:57 PM

## 2021-05-04 NOTE — Discharge Summary (Signed)
Physician Discharge Summary  Patient ID: Carolyn Russo MRN: [id number] DOB/AGE: [birth year] 70 y.o.  Admit date: 04/20/2021 Discharge date: 05/05/2021  Discharge Diagnoses:  Principal Problem:   Cerebrovascular accident (CVA) of right basal ganglia ([place]) Active Problems:   ESRD on dialysis ([place])   Bandemia Anxiety Hypertension Asthma Hyperlipidemia Obesity Diabetes mellitus Constipation  Discharged Condition: Stable  Significant Diagnostic Studies: DG Chest 2 View  Result Date: 05/03/2021 CLINICAL DATA:  70 year old female with shortness of breath. EXAM: CHEST - 2 VIEW COMPARISON:  Chest radiograph dated 04/29/2021. FINDINGS: Dialysis catheter with tip in similar position close to the cavoatrial junction. No focal consolidation, pleural effusion or pneumothorax. Left lung base atelectasis. Stable cardiac silhouette. No acute osseous pathology. Degenerative changes of the spine. IMPRESSION: No active cardiopulmonary disease. Electronically Signed   By: Anner Crete M.D.   On: 05/03/2021 21:23   DG Chest 2 View  Result Date: 04/29/2021 CLINICAL DATA:  Pt experiencing SOB  CVA EXAM: CHEST - 2 VIEW COMPARISON:  04/22/2021 FINDINGS: Relatively low lung volumes. Chronic linear scarring or subsegmental atelectasis in the left infrahilar region and laterally in the left mid lung. Heart size upper limits normal. Tunneled right IJ hemodialysis catheter extends to the mid right atrium as before. No effusion.  No pneumothorax. Thoracic dextroscoliosis. IMPRESSION: Low lung volumes.  No acute findings. Electronically Signed   By: Lucrezia Europe M.D.   On: 04/29/2021 11:42   DG Chest 2 View  Result Date: 04/22/2021 CLINICAL DATA:  Leukocytosis. EXAM: CHEST - 2 VIEW COMPARISON:  April 16, 2021. FINDINGS: Stable cardiomediastinal silhouette. Right internal jugular dialysis catheter is unchanged. No pneumothorax is noted. Minimal left basilar subsegmental atelectasis is noted. Right lung is clear. Bony  thorax is unremarkable. IMPRESSION: Minimal left basilar subsegmental atelectasis. Electronically Signed   By: Marijo Conception M.D.   On: 04/22/2021 14:04   DG Abd 1 View  Result Date: 04/26/2021 CLINICAL DATA:  LEFT-sided abdominal pain since yesterday. Constipation. EXAM: ABDOMEN - 1 VIEW COMPARISON:  CT of the abdomen and pelvis on 12/13/2004 FINDINGS: Bowel gas pattern is nonobstructive. Moderate stool burden noted within nondilated loops of colon. Scoliosis is associated with degenerative changes in the lumbar spine. IMPRESSION: No evidence for acute  abnormality.  Moderate stool burden. Electronically Signed   By: Nolon Nations M.D.   On: 04/26/2021 12:44   CT HEAD WO CONTRAST  Result Date: 05/03/2021 CLINICAL DATA:  Mental status changes of unknown cause. EXAM: CT HEAD WITHOUT CONTRAST TECHNIQUE: Contiguous axial images were obtained from the base of the skull through the vertex without intravenous contrast. COMPARISON:  04/18/2021 FINDINGS: Brain: No focal abnormality seen affecting the brainstem or cerebellum. Subacute infarction in the right lateral thalamus/posterior limb internal capsule is more clearly visible. No evidence of extension. Chronic small-vessel ischemic changes elsewhere throughout the cerebral hemispheric white matter as seen previously. No sign of new insult, mass, hemorrhage, hydrocephalus or extra-axial collection. Vascular: There is atherosclerotic calcification of the major vessels at the base of the brain. Skull: Negative Sinuses/Orbits: Clear sinuses. Previous functional endoscopic sinus surgery. Orbits negative. Other: None IMPRESSION: No acute CT finding. Subacute infarction in the right lateral thalamus/radiating white matter tracts is more clearly visible but there is no sign of extension or hemorrhage. Chronic small-vessel ischemic changes elsewhere affecting the cerebral hemispheric white matter. Electronically Signed   By: Nelson Chimes M.D.   On: 05/03/2021 19:06    CT HEAD WO CONTRAST  Result Date: 04/18/2021 CLINICAL DATA:  Follow-up  stroke. EXAM: CT HEAD WITHOUT CONTRAST TECHNIQUE: Contiguous axial images were obtained from the base of the skull through the vertex without intravenous contrast. COMPARISON:  CT scan April 14, 2021.  MRI of the head April 16, 2021. FINDINGS: Brain: No subdural, epidural, or subarachnoid hemorrhage. No mass effect or midline shift. Ventricles and sulci are unchanged. The known right basal ganglia infarct is more pronounced on CT imaging in the interval seen on series 4, image 17 with decreased attenuation. No associated hemorrhage. White matter changes otherwise stable. Cerebellum, brainstem, and basal cisterns are normal. No other evidence of acute infarct or ischemia. Vascular: Calcified atherosclerosis in the intracranial carotids. Skull: Normal. Negative for fracture or focal lesion. Sinuses/Orbits: No acute finding. Other: No other abnormalities. IMPRESSION: 1. The right basal ganglia infarct is better visualized on today's CT compared to the April 14, 2021 CT due to group decreasing attenuation. No associated hemorrhage identified. No other significant interval changes. Electronically Signed   By: Dorise Bullion III M.D   On: 04/18/2021 13:04   CT HEAD WO CONTRAST  Result Date: 04/14/2021 CLINICAL DATA:  Possible stroke.  LEFT-sided weakness EXAM: CT HEAD WITHOUT CONTRAST TECHNIQUE: Contiguous axial images were obtained from the base of the skull through the vertex without intravenous contrast. COMPARISON:  None. FINDINGS: Brain: No acute intracranial hemorrhage. No focal mass lesion. No CT evidence of acute infarction. No midline shift or mass effect. No hydrocephalus. Basilar cisterns are patent. There are periventricular and subcortical white matter hypodensities. Generalized cortical atrophy. Vascular: No hyperdense vessel or unexpected calcification. Skull: Normal. Negative for fracture or focal lesion. Sinuses/Orbits:  Paranasal sinuses and mastoid air cells are clear. Orbits are clear. Other: None. IMPRESSION: 1. No acute intracranial findings. 2. Atrophy and white matter microvascular disease. Electronically Signed   By: Suzy Bouchard M.D.   On: 04/14/2021 18:58   MR ANGIO HEAD WO CONTRAST  Result Date: 04/17/2021 CLINICAL DATA:  Initial evaluation for acute stroke. EXAM: MRA NECK WITHOUT CONTRAST MRA HEAD WITHOUT CONTRAST TECHNIQUE: Multiplanar and multiecho pulse sequences of the neck were obtained without intravenous contrast. Angiographic images of the neck were obtained using MRA technique without and with intravenous contrast; Angiographic images of the Circle of Willis were obtained using MRA technique without intravenous contrast. COMPARISON:  Prior MRI from 04/16/2021. FINDINGS: MRA NECK FINDINGS AORTIC ARCH: Examination severely degraded by motion artifact. Partially visualized aortic arch grossly normal in caliber with normal branch pattern. No appreciable stenosis about the origin of the great vessels. RIGHT CAROTID SYSTEM: Visualized right CCA patent without stenosis. No appreciable stenosis or significant irregularity about the right carotid bifurcation. Visualized right ICA patent without stenosis or occlusion. LEFT CAROTID SYSTEM: Visualized left CCA patent without stenosis. No significant atheromatous narrowing or irregularity about the left bifurcation. Left ICA patent without stenosis or occlusion. VERTEBRAL ARTERIES: Origins of the vertebral arteries not well assessed on this exam. Visualized portions of the vertebral arteries patent with antegrade flow, with no appreciable stenosis or occlusion. MRA HEAD FINDINGS ANTERIOR CIRCULATION: Examination moderately degraded by motion artifact. Visualized distal cervical segments of the internal carotid arteries are patent with antegrade flow. Petrous, cavernous, and supraclinoid segments patent without stenosis or other abnormality. A1 segments patent. Normal  anterior communicating artery complex. Anterior cerebral arteries grossly patent to their distal aspects without appreciable stenosis. M1 segments patent bilaterally. Ill-defined attenuation involving the distal left M1 segment and proximal M2 branches favored to be artifactual on this exam. No definite proximal stenosis. MCA branches well perfused  and symmetric. POSTERIOR CIRCULATION: Vertebral arteries codominant and patent to the vertebrobasilar junction without stenosis. Both PICA origins patent and normal. Basilar patent to its distal aspect without stenosis. Superior cerebellar arteries patent bilaterally. Both PCA supplied via the basilar as well as prominent bilateral posterior communicating arteries. PCAs patent to their distal aspects without definite proximal stenosis. No visible aneurysm or other vascular abnormality. IMPRESSION: 1. Technically limited exam due to extensive motion artifact. 2. Grossly negative MRA of the head and neck. No large vessel occlusion. No proximal high-grade or correctable stenosis. Electronically Signed   By: Jeannine Boga M.D.   On: 04/17/2021 03:36   MR ANGIO NECK WO CONTRAST  Result Date: 04/17/2021 CLINICAL DATA:  Initial evaluation for acute stroke. EXAM: MRA NECK WITHOUT CONTRAST MRA HEAD WITHOUT CONTRAST TECHNIQUE: Multiplanar and multiecho pulse sequences of the neck were obtained without intravenous contrast. Angiographic images of the neck were obtained using MRA technique without and with intravenous contrast; Angiographic images of the Circle of Willis were obtained using MRA technique without intravenous contrast. COMPARISON:  Prior MRI from 04/16/2021. FINDINGS: MRA NECK FINDINGS AORTIC ARCH: Examination severely degraded by motion artifact. Partially visualized aortic arch grossly normal in caliber with normal branch pattern. No appreciable stenosis about the origin of the great vessels. RIGHT CAROTID SYSTEM: Visualized right CCA patent without  stenosis. No appreciable stenosis or significant irregularity about the right carotid bifurcation. Visualized right ICA patent without stenosis or occlusion. LEFT CAROTID SYSTEM: Visualized left CCA patent without stenosis. No significant atheromatous narrowing or irregularity about the left bifurcation. Left ICA patent without stenosis or occlusion. VERTEBRAL ARTERIES: Origins of the vertebral arteries not well assessed on this exam. Visualized portions of the vertebral arteries patent with antegrade flow, with no appreciable stenosis or occlusion. MRA HEAD FINDINGS ANTERIOR CIRCULATION: Examination moderately degraded by motion artifact. Visualized distal cervical segments of the internal carotid arteries are patent with antegrade flow. Petrous, cavernous, and supraclinoid segments patent without stenosis or other abnormality. A1 segments patent. Normal anterior communicating artery complex. Anterior cerebral arteries grossly patent to their distal aspects without appreciable stenosis. M1 segments patent bilaterally. Ill-defined attenuation involving the distal left M1 segment and proximal M2 branches favored to be artifactual on this exam. No definite proximal stenosis. MCA branches well perfused and symmetric. POSTERIOR CIRCULATION: Vertebral arteries codominant and patent to the vertebrobasilar junction without stenosis. Both PICA origins patent and normal. Basilar patent to its distal aspect without stenosis. Superior cerebellar arteries patent bilaterally. Both PCA supplied via the basilar as well as prominent bilateral posterior communicating arteries. PCAs patent to their distal aspects without definite proximal stenosis. No visible aneurysm or other vascular abnormality. IMPRESSION: 1. Technically limited exam due to extensive motion artifact. 2. Grossly negative MRA of the head and neck. No large vessel occlusion. No proximal high-grade or correctable stenosis. Electronically Signed   By: Jeannine Boga M.D.   On: 04/17/2021 03:36   MR BRAIN WO CONTRAST  Result Date: 04/16/2021 CLINICAL DATA:  Initial evaluation for acute stroke. EXAM: MRI HEAD WITHOUT CONTRAST TECHNIQUE: Multiplanar, multiecho pulse sequences of the brain and surrounding structures were obtained without intravenous contrast. COMPARISON:  Prior head CT from 04/14/2021. FINDINGS: Brain: Cerebral volume within normal limits for age. Patchy and hazy T2/FLAIR hyperintensity within the periventricular deep white matter both cerebral hemispheres most consistent with chronic small vessel ischemic disease, moderately advanced in nature. 2.1 cm focus of restricted diffusion seen involving the right basal ganglia, consistent with an acute ischemic infarct. No  associated hemorrhage or mass effect. No other evidence for acute or subacute ischemia. Gray-white matter differentiation otherwise maintained. No other areas of acute or subacute infarction. No encephalomalacia to suggest chronic cortical infarction. No foci of susceptibility artifact to suggest acute or chronic intracranial hemorrhage. No mass lesion, midline shift or mass effect. No hydrocephalus or extra-axial fluid collection. Pituitary gland within normal limits. Suprasellar region normal. Midline structures intact. Vascular: Major intracranial vascular flow voids are maintained. Skull and upper cervical spine: Craniocervical junction within normal limits. Bone marrow signal intensity normal. No scalp soft tissue abnormality. Sinuses/Orbits: Patient status post bilateral ocular lens replacement. Globes and orbital soft tissues demonstrate no acute finding. Mild scattered mucosal thickening noted within the ethmoidal air cells. Paranasal sinuses are otherwise clear. No mastoid effusion. Inner ear structures grossly normal. Other: None. IMPRESSION: 1. 2.1 cm acute ischemic nonhemorrhagic right basal ganglia infarct. 2. Underlying moderate chronic microvascular ischemic disease.  Electronically Signed   By: Jeannine Boga M.D.   On: 04/16/2021 21:16   DG CHEST PORT 1 VIEW  Result Date: 04/16/2021 CLINICAL DATA:  Stroke. EXAM: PORTABLE CHEST 1 VIEW COMPARISON:  Chest x-ray dated Mar 23, 2021. FINDINGS: Unchanged tunneled right internal jugular dialysis catheter. Stable cardiomegaly. Unchanged mild left basilar atelectasis. No focal consolidation, pleural effusion, or pneumothorax. No acute osseous abnormality. IMPRESSION: No active disease. Electronically Signed   By: Titus Dubin M.D.   On: 04/16/2021 20:32    Labs:  Basic Metabolic Panel: Recent Labs  Lab 04/27/21 1017 04/29/21 1325 04/30/21 0508 05/01/21 0553 05/03/21 1044  NA 130* 129* 131* 132* 131*  K 4.9 4.9 4.4 4.2 4.8  CL 96* 94* 97* 98 97*  CO2 25 26 25 27 25   GLUCOSE 329* 397* 287* 163* 306*  BUN 41* 28* 21 23 21   CREATININE 7.15* 5.73* 4.82* 5.50* 5.90*  CALCIUM 9.2 8.6* 8.9 8.8* 8.6*  PHOS 4.5 4.2  --  4.7* 4.8*    CBC: Recent Labs  Lab 04/30/21 0508 05/01/21 0553 05/03/21 1044  WBC 14.4* 13.0* 12.8*  NEUTROABS  --  6.9 7.9*  HGB 9.9* 9.8* 9.7*  HCT 28.3* 28.4* 29.5*  MCV 84.2 86.1 88.6  PLT 254 238 299    CBG: Recent Labs  Lab 05/02/21 2055 05/03/21 0620 05/03/21 1128 05/03/21 1632 05/03/21 2100  GLUCAP 310* 181* 234* 229* 72*   Family history.  Mother with abdominal melanoma and psoriasis Father with Alzheimer's disease.  Negative for colon cancer esophageal cancer or rectal cancer or stomach cancer  Brief HPI:   Carolyn Russo is a 69 y.o. right-handed female with history of hypertension obesity hyperlipidemia newly diagnosed end-stage renal disease on hemodialysis Monday Wednesday Friday diabetes mellitus CAD maintained on aspirin iron deficiency anemia and asthma.  Patient with latest admission 03/09/2021 to 03/24/2021 for acute on chronic diastolic congestive heart failure exacerbation.  She was admitted on 04/16/2021 with left-sided weakness x3 days slurred speech  and bouts of vomiting.  It was noted patient had initially presented to the ER couple of days prior to the latest admission with complaints of left-sided weakness but left without being formally evaluated.  Cranial CT scan showed no acute abnormalities.  Patient did not receive tPA.  MRI of the brain showed a 2.1 cm acute ischemic nonhemorrhagic right basal ganglia infarction.  MRA angiogram of head and neck grossly negative no large vessel occlusion or high-grade stenosis.  Most recent echocardiogram with ejection fraction of 14% grade 2 diastolic dysfunction.    Dr.  Carlean Purl was consulted 04/18/2021 after patient had multiple large bloody bowel movements with clots.  Underwent upper endoscopy 04/18/2021 showing normal esophagus and stomach and cardia and gastric fundus normal on retroflexion and also underwent flexible sigmoidoscopy showing hemorrhoids found on perianal exam external/internal hemorrhoids.  Patient had initially been placed on aspirin and Plavix for CVA prophylaxis which was held due to suspicion of lower GI bleed and was awaiting plan on resuming anticoagulation. HD managed by renal services. Patient with left side weakness, fatigue and cognitive deficits. CIR was recommended due to functional decline.    Hospital Course: Carolyn Russo was admitted to rehab 04/20/2021 for inpatient therapies to consist of PT, ST and OT at least three hours five days a week. Past admission physiatrist, therapy team and rehab RN have worked together to provide customized collaborative inpatient rehab.  Pertain to patient's nonhemorrhagic right basal ganglia infarction followed by neurology services her aspirin has been resumed. And she was monitored closely for noted lower GI bleed followed by gastroenterology services.  Cranial CT scan 05/03/2021 unremarkable for acute changes.  Hospital course right hip bursitis tramadol as indicated Lidoderm patch.  Bouts of anxiety Xanax provided emotional support provided. H/H  was stable and Plavix was resumed on 07/10. Follow up CBC showed H/H to be stable but persistent leucocytosis without signs of infection.   Patient with newly diagnosed end-stage renal disease with HD ongoing. She has had issues with nausea as well as abdominal pain during HD and has been cutting sessions short. Zofran was scheduled prior to HD to help manage symptoms.  Her blood pressure is well controlled on Cozaar as well as Coreg.  She exhibited no signs of fluid overload remaining on Lasix 40 mg Monday Wednesday Friday.  Patient had recently had placed right first stage basilic vein fistula 12/26/2874 per vascular surgery monitored patient did have some complaints of numbness in the hand felt somewhat clumsy.  It was discussed with patient the possibility of underlying steal syndrome especially with patient fistula could be stealing flow from her hand all options were provided ultimately she requested to continue to observe she was to follow-up with vascular surgery.  She was maintained on Crestor  for hyperlipidemia.    Blood sugars were monitored ac/hs and Lantus was titrated upwards for tighter control.   She was making slow progress and noted to have fatigue and flat affect. On 07/13 pm after HD session, she was noted to be confused, had left inattention with right gaze preference. CT head was negative for acute changes or bleed. Head CTA/perfusion was negative for thrombus. Dr. Cheral Marker felt that patient's symptoms consistent with extension of stroke and recommended transfer to acute floor for closer monitoring. [facility] hospitalist was contacted for assistance and patient was discharged on 05/05/21.   Rehab course: During patient's stay in rehab weekly team conferences were held to monitor patient's progress, set goals and discuss barriers to discharge. At admission, patient required minimal assist stand pivot transfers minimal assist 15 feet rolling walker max assist lower body dressing minimal assist  toilet transfers. She  has had improvement in activity tolerance, balance, postural control as well as ability to compensate for deficits. She has had improvement in functional use RUE/LUE  and RLE/LLE as well as improvement in awareness..  Minimal assist to stand rolling walker edge of bed variable and mobility walks to the bathroom rolling walker minimal assist.  Completed toilet task contact-guard.  Ambulates to the sink rolling walker minimal assist.  Speech  therapy did continue to follow for dysphagia.     Diet: Dysphagia #2 thin liquids  Medications at discharge 1.  Tylenol as needed 2.  Xanax 0.5 mg 3 times daily as needed anxiety 3.  Aspirin 81 mg p.o. daily 4.  Coreg 12.5 mg p.o. twice daily 5.  Plavix 75 mg p.o. daily 6.  Voltaren gel 2 g 4 times daily to left shoulder 7.  Lasix 40 mg p.o. Monday Wednesday Friday 8.  Lantus insulin 13 units twice daily 9.  Zaditor ophthalmic solution 0.025% 2 drops both eyes twice daily 10.  Lidoderm patch one to area under left breast and one to right hip--on at 6 pm and off at 6 am.  11.  Cozaar 25 mg p.o. every morning 12.  Protonix 40 mg p.o. twice daily 13.  Crestor 20 mg p.o. daily 14.  Senokot S2 tablets p.o. twice daily hold for loose stools 15.  Renvela 0.8 mg p.o. 3 times daily with meals 16.  Tramadol 50 mg p.o. every 12 hours as needed pain 17.  Albuterol Ventolin inhaler 2 puffs every 4 hours as needed 18. Zofran 4 mg every 8 hours prn--premedicate with 4 mg 45 minutes prior to HD on MWF.  19. Budesonide 0.5 mg nebs bid 20. Flonase 50 mcg 2 sprays daily in each nares 21. Anusol HC suppository 25 mg bid PR 22.  Arformoterol 15 mcg nebs bid.   Diet: Dysphagia 2, thin liquids  Disposition: Acute hospital    Discharge Instructions     Ambulatory referral to Neurology   Complete by: As directed    An appointment is requested in approximately: 4 weeks right basal ganglia infarction        Follow-up Information      Lovorn, Jinny Blossom, MD Follow up.   Specialty: Physical Medicine and Rehabilitation Why: Office to call for appointment Contact information: [address] [place] [address] [phone]         Gatha Mayer, MD Follow up.   Specialty: Gastroenterology Why: Call for appointment Contact information: [name] [address] [phone]         Rexene Agent, MD Follow up.   Specialty: Nephrology Why: Call for appointment Contact information: [place] [phone]         Angelia Mould, MD Follow up.   Specialties: Vascular Surgery, Cardiology Why: office will call you to arrange your appt (sent) Contact information: [address] [place] [address] [phone]                 Signed: Lavon Paganini [place] 05/04/2021, 5:46 AM

## 2021-05-04 NOTE — Progress Notes (Addendum)
Patient ID: Carolyn Russo, female   DOB: [birth year], 70 y.o.   MRN: [id number]  SW made efforts to provide pt wife with updates, pt she presented with some confusion and HOH. Pt was also very tired as she just arrived from therapy within the last 10-15 minutes. Pt was holding phone stating she was waiting for her husband to call back.   SW called [name] [phone]) to inform on above, and d/c date from 7/14 to 7/30. SW informed him wife was set up with [facility] when in acute. He continues to work on establishing 24/7 care for his wife. He also mentioned that he has notice pt bx above based on the time of day as well. He mentioned his wife stated that they may need a higher bed, and he discussed with therapy different ways to accommodate the home. SW informed will mention to therapy if any changes needs to be made. He is aware SW will f/u after team conference with more updates.   SW spoke with Marya Amsler Donaldson/Dialysis Coord. [phone]) to inform on change in pt d/c date. Will call SW back as driving. *SW updated him on above. Reports he will share with dialysis center and will f/u if there is an issue.   Loralee Pacas, MSW, [place] Office: [phone] Cell: [phone] Fax: [phone]

## 2021-05-04 NOTE — Progress Notes (Signed)
PROGRESS NOTE   Subjective/Complaints:  Pt working with SLP currently- much more awake, but still has delayed processing per SLP- working much more with them- almost back to where she was prior to lethargy yesterday- per pt's husband (spoke on phone) she said hadn't slept last 2 days much.   Also declined to finish HD last 3 times- lacking ~ 1 hour of time- she says due to nausea/upper abd pain-  Also had enema last night and had good results.     KZS:WFUXNATF, but still limited due to cognition  Objective:   DG Chest 2 View  Result Date: 05/03/2021 CLINICAL DATA:  70 year old female with shortness of breath. EXAM: CHEST - 2 VIEW COMPARISON:  Chest radiograph dated 04/29/2021. FINDINGS: Dialysis catheter with tip in similar position close to the cavoatrial junction. No focal consolidation, pleural effusion or pneumothorax. Left lung base atelectasis. Stable cardiac silhouette. No acute osseous pathology. Degenerative changes of the spine. IMPRESSION: No active cardiopulmonary disease. Electronically Signed   By: Anner Crete M.D.   On: 05/03/2021 21:23   CT HEAD WO CONTRAST  Result Date: 05/03/2021 CLINICAL DATA:  Mental status changes of unknown cause. EXAM: CT HEAD WITHOUT CONTRAST TECHNIQUE: Contiguous axial images were obtained from the base of the skull through the vertex without intravenous contrast. COMPARISON:  04/18/2021 FINDINGS: Brain: No focal abnormality seen affecting the brainstem or cerebellum. Subacute infarction in the right lateral thalamus/posterior limb internal capsule is more clearly visible. No evidence of extension. Chronic small-vessel ischemic changes elsewhere throughout the cerebral hemispheric white matter as seen previously. No sign of new insult, mass, hemorrhage, hydrocephalus or extra-axial collection. Vascular: There is atherosclerotic calcification of the major vessels at the base of the brain.  Skull: Negative Sinuses/Orbits: Clear sinuses. Previous functional endoscopic sinus surgery. Orbits negative. Other: None IMPRESSION: No acute CT finding. Subacute infarction in the right lateral thalamus/radiating white matter tracts is more clearly visible but there is no sign of extension or hemorrhage. Chronic small-vessel ischemic changes elsewhere affecting the cerebral hemispheric white matter. Electronically Signed   By: Nelson Chimes M.D.   On: 05/03/2021 19:06   Recent Labs    05/03/21 1044  WBC 12.8*  HGB 9.7*  HCT 29.5*  PLT 299    Recent Labs    05/03/21 1044  NA 131*  K 4.8  CL 97*  CO2 25  GLUCOSE 306*  BUN 21  CREATININE 5.90*  CALCIUM 8.6*      Intake/Output Summary (Last 24 hours) at 05/04/2021 1005 Last data filed at 05/04/2021 0815 Gross per 24 hour  Intake 560 ml  Output 946 ml  Net -386 ml        Physical Exam: Vital Signs Blood pressure (!) 152/82, pulse (!) 58, temperature (!) 97.5 F (36.4 C), temperature source Oral, resp. rate 20, height 5\' 2"  (1.575 m), weight 82.1 kg, SpO2 100 %.    General: awake, alert, more appropriate, interactive- finishing breakfast; SLP in room; NAD HENT: conjugate gaze; oropharynx moist CV: slightly bradycardic/regular rate; no JVD Pulmonary: CTA B/L; no W/R/R- good air movement- O2 in place by [place] 2L GI: soft, NT, ND, (+)BS- normoactive Psychiatric: a still flat affect-  more awake- very flat Neurological: more alert  Musculoskeletal:        General: No swelling.    Cervical back: Normal range of motion.    Comments: Mild tenderness left shoulder with ER/IR today  Skin:    General: Skin is warm and dry. Neurological:    Comments: alert, follows commands.   Fair insight and awareness. Provided biographical information. Normal language, speech slightly slurred. Mild left central 7. LUE 2 to 2+/5 deltoid, 3/5 biceps, triceps and 3+/5 wrist and HI. LLE 3-4/5 prox to distal. RUE and RLE 4-5/5 prox to distal. No  sensory deficits noted--motor/sensory exam stable today.   Assessment/Plan: 1. Functional deficits which require 3+ hours per day of interdisciplinary therapy in a comprehensive inpatient rehab setting. Physiatrist is providing close team supervision and 24 hour management of active medical problems listed below. Physiatrist and rehab team continue to assess barriers to discharge/monitor patient progress toward functional and medical goals  Care Tool:  Bathing    Body parts bathed by patient: Right arm, Left arm, Chest, Abdomen, Front perineal area, Right upper leg, Left upper leg   Body parts bathed by helper: Right lower leg, Left lower leg, Buttocks     Bathing assist Assist Level: Minimal Assistance - Patient > 75%     Upper Body Dressing/Undressing Upper body dressing   What is the patient wearing?: Pull over shirt    Upper body assist Assist Level: Minimal Assistance - Patient > 75%    Lower Body Dressing/Undressing Lower body dressing      What is the patient wearing?: Pants, Incontinence brief     Lower body assist Assist for lower body dressing: Minimal Assistance - Patient > 75%     Toileting Toileting    Toileting assist Assist for toileting: Moderate Assistance - Patient 50 - 74%     Transfers Chair/bed transfer  Transfers assist     Chair/bed transfer assist level: Moderate Assistance - Patient 50 - 74%     Locomotion Ambulation   Ambulation assist      Assist level: Moderate Assistance - Patient 50 - 74% Assistive device: Walker-rolling Max distance: 20'   Walk 10 feet activity   Assist     Assist level: Moderate Assistance - Patient - 50 - 74% Assistive device: Walker-rolling   Walk 50 feet activity   Assist Walk 50 feet with 2 turns activity did not occur: Safety/medical concerns  Assist level: Contact Guard/Touching assist Assistive device: Walker-rolling    Walk 150 feet activity   Assist Walk 150 feet activity did  not occur: Safety/medical concerns         Walk 10 feet on uneven surface  activity   Assist Walk 10 feet on uneven surfaces activity did not occur: Safety/medical concerns         Wheelchair     Assist Will patient use wheelchair at discharge?:  (TBD) Type of Wheelchair: Manual    Wheelchair assist level: Dependent - Patient 0% Max wheelchair distance: 150'    Wheelchair 50 feet with 2 turns activity    Assist        Assist Level: Dependent - Patient 0%   Wheelchair 150 feet activity     Assist      Assist Level: Dependent - Patient 0%   Blood pressure (!) 152/82, pulse (!) 58, temperature (!) 97.5 F (36.4 C), temperature source Oral, resp. rate 20, height 5\' 2"  (1.575 m), weight 82.1 kg, SpO2 100 %.  Medical Problem  List and Plan: 1.  Left side weakness secondary to nonhemorrhagic right basal ganglia infarction             -patient may shower             -ELOS/Goals: 10-12 days, mod I goals for PT, OT, SLP  -Continue CIR- PT, OT and SLP 2.  Impaired mobility: -DVT/anticoagulation:  continue SCD             -antiplatelet therapy:ASA  Plavix restarted.  3. Right hip bursitis: continue Tramadol 50 mg every 12 hours as needed, ice, ROM, lidocaine patch 4. Anxiety: continue Xanax 0.5 mg 3 times daily as needed.  Provide emotional support  7/12- per husband, concerned about flat affect- will d/w possible depression with pt- if so, will start SSRI.              -antipsychotic agents: N/A 5. Neuropsych: This patient is capable of making decisions on her own behalf. 6. Skin/Wound Care: Routine skin checks 7. Fluids/Electrolytes/Nutrition: Routine in and outs with follow-up chemistries 8.  Acute lower GI bleed with blood loss anemia.  Follow-up GI services.  Resume ASA as above. - Continue Protonix 40 mg twice daily 7/6: hgb 9.6--monitor for gross bleeding 7/11- last Hb stable at 9.8- will recheck this week  7/12- Hb came back at 9.7- stable- con't  regimen 9.  Chronic diastolic congestive heart failure.  Monitor for any signs of fluid overload.  Continue Lasix 40-80 mg Monday Wednesday Friday             -daily weights, have been stable  7/12- weight bounced up 1 kg today, however was this level 2-3 days ago, so think yesterday's weight might have been slightly off- will monitor 10.  Newly Dx'd End-stage renal disease.  Hemodialysis as per renal services  7/12- questionable steal syndrome since Fistula placement- will monitor per Vascular- to see if  needs additional intervention 11.  Hypertension.  Increase Coreg to 12.5 mg twice daily. Aggressive filtration with ultradialysis tomorrow  7/11- HD today- BP 170s/90s this AM- per renal.   7/12- BP dropped to 70s/50s with HD yesterday- back up to 150s/80s today- con't reigmen 12.  Asthma.  Continue inhalers as directed  - was getting Home O2 prior to admission- will likely need to go home on 2L O2-   Was trying to get at home, but hadn't yet.   7/1- con't nebs prn, home O2 ?PRN only  7/3 lungs very clear today, sats 99% on RA. Some of SOB may be behaviorally related. Reassured pt today that lungs sounded fine.   -encouraged FV/IS, OOB  7/7: ordered CXT-stable, ordered incentive spirometer 13.Hyperlipidemia.Crestor 14.Obesity.BMI 34.31.Dietery follow up 15.  Diabetes mellitus.hemoglobin AIc 5.2.  CBGs 211-310. Increase Lantus to 13U BID.   16. Leukocytosis: trending down, repeat Monday  7/11- last WBC was 13k- will recheck in HD today.  17. Constipation: KUB shows moderate stool burden. Increase senna-docusate to BID.   18. Fistula placement: successful, new fistula accessed without complications.  19. Left shoulder pain: ordered lidocaine patch and voltaren gel.  20. Lethargy  7/12- is much improved today- will con't to monitor- CT and CXR of chest were stable  I spent a total of 41 minutes on visit/care today- >50% on coordination of care- called husband and was also on phone with him  as well as speaking with PA and going over PA/vascular notes/labs myself as well as imaging.     LOS: 14 days A FACE  TO FACE EVALUATION WAS PERFORMED  Carolyn Russo 05/04/2021, 10:05 AM

## 2021-05-04 NOTE — Progress Notes (Signed)
Speech Language Pathology Daily Session Note  Patient Details  Name: Carolyn Russo MRN: [id number] Date of Birth: 07-Oct-1951  Today's Date: 05/04/2021 SLP Individual Time: 0735-0800 SLP Individual Time Calculation (min): 25 min  Short Term Goals: Week 2: SLP Short Term Goal 1 (Week 2): STG=LTG  Skilled Therapeutic Interventions: Skilled ST services focused on cognitive and swallow skills. Pt was orientated times x4 with supervision A verbal cues for calendar and visual aid brought into eyesight. Pt was able to recall orientation at the end of the session x4. Pt demonstrated increase alertness compared to yesterday's session,but continued to demonstrate delays in verbal/physical initiation and delayed processing. Pt was able to self-feed dys 2 textures and thin liquids via straw with min A from a physical standpoint and required supervision A verbal cues for swallow strategies with ability to self-advocate for assistance SLP and pt discussed continued deficits in initiation/ delayed processing but pt continues to demonstrate reduced insight into impact of deficits. Pt was left with call bell within reach and bed alarm set. Recommend to continue ST services.      Pain Pain Assessment Pain Scale: 0-10 Pain Score: 0-No pain  Therapy/Group: Individual Therapy  [name]  [facility] 05/04/2021, 11:34 AM

## 2021-05-04 NOTE — Progress Notes (Signed)
[name] Progress Note   Subjective:   Patient seen and examined in room.  Sitting in wheelchair by bed.  Continues to be lethargic. Wakes to verbal stimuli to answer questions and returns to sleep.  Signed off dialysis early again yesterday due to abdominal pain, n/v.  Nausea slightly improved today.  Denies CP, SOB and abdominal pain.  Objective Vitals:   05/04/21 0527 05/04/21 0859 05/04/21 0925 05/04/21 1125  BP: (!) 154/47  (!) 152/82 (!) 185/69  Pulse: 61 (!) 57 (!) 58 (!) 59  Resp: 19 18 20 20   Temp: 97.9 F (36.6 C)  (!) 97.5 F (36.4 C) 97.7 F (36.5 C)  TempSrc: Oral  Oral   SpO2: 100% 100% 100% 100%  Weight: 82.1 kg     Height:       Physical Exam General:well appearing, lethargic female in NAD, sitting in wheelchair, follows all commands Heart:RRR Lungs:CTAB, nml WOB Abdomen:soft, NTND Extremities:no LE edema Dialysis Access: RU AVF maturing +b/t, [facility]   Filed Weights   05/03/21 1306 05/03/21 1548 05/04/21 0527  Weight: 81.9 kg 80.9 kg 82.1 kg    Intake/Output Summary (Last 24 hours) at 05/04/2021 1128 Last data filed at 05/04/2021 0900 Gross per 24 hour  Intake 560 ml  Output 947 ml  Net -387 ml    Additional Objective Labs: Basic Metabolic Panel: Recent Labs  Lab 04/29/21 1325 04/30/21 0508 05/01/21 0553 05/03/21 1044  NA 129* 131* 132* 131*  K 4.9 4.4 4.2 4.8  CL 94* 97* 98 97*  CO2 26 25 27 25   GLUCOSE 397* 287* 163* 306*  BUN 28* 21 23 21   CREATININE 5.73* 4.82* 5.50* 5.90*  CALCIUM 8.6* 8.9 8.8* 8.6*  PHOS 4.2  --  4.7* 4.8*   Liver Function Tests: Recent Labs  Lab 04/29/21 1325 05/01/21 0553 05/03/21 1044  ALBUMIN 3.1* 2.9* 2.8*    CBC: Recent Labs  Lab 04/29/21 1325 04/30/21 0508 05/01/21 0553 05/03/21 1044  WBC 12.1* 14.4* 13.0* 12.8*  NEUTROABS  --   --  6.9 7.9*  HGB 10.0* 9.9* 9.8* 9.7*  HCT 29.5* 28.3* 28.4* 29.5*  MCV 86.8 84.2 86.1 88.6  PLT 261 254 238 299   CBG: Recent Labs  Lab  05/03/21 1128 05/03/21 1632 05/03/21 2100 05/04/21 0558 05/04/21 1119  GLUCAP 234* 229* 263* 117* 178*   Studies/Results: DG Chest 2 View  Result Date: 05/03/2021 CLINICAL DATA:  70 year old female with shortness of breath. EXAM: CHEST - 2 VIEW COMPARISON:  Chest radiograph dated 04/29/2021. FINDINGS: Dialysis catheter with tip in similar position close to the cavoatrial junction. No focal consolidation, pleural effusion or pneumothorax. Left lung base atelectasis. Stable cardiac silhouette. No acute osseous pathology. Degenerative changes of the spine. IMPRESSION: No active cardiopulmonary disease. Electronically Signed   By: Anner Crete M.D.   On: 05/03/2021 21:23   CT HEAD WO CONTRAST  Result Date: 05/03/2021 CLINICAL DATA:  Mental status changes of unknown cause. EXAM: CT HEAD WITHOUT CONTRAST TECHNIQUE: Contiguous axial images were obtained from the base of the skull through the vertex without intravenous contrast. COMPARISON:  04/18/2021 FINDINGS: Brain: No focal abnormality seen affecting the brainstem or cerebellum. Subacute infarction in the right lateral thalamus/posterior limb internal capsule is more clearly visible. No evidence of extension. Chronic small-vessel ischemic changes elsewhere throughout the cerebral hemispheric white matter as seen previously. No sign of new insult, mass, hemorrhage, hydrocephalus or extra-axial collection. Vascular: There is atherosclerotic calcification of the major vessels at the base  of the brain. Skull: Negative Sinuses/Orbits: Clear sinuses. Previous functional endoscopic sinus surgery. Orbits negative. Other: None IMPRESSION: No acute CT finding. Subacute infarction in the right lateral thalamus/radiating white matter tracts is more clearly visible but there is no sign of extension or hemorrhage. Chronic small-vessel ischemic changes elsewhere affecting the cerebral hemispheric white matter. Electronically Signed   By: Nelson Chimes M.D.   On:  05/03/2021 19:06    Medications:  sodium chloride 10 mL/hr at 04/30/21 1249    arformoterol  15 mcg Nebulization BID   aspirin EC  81 mg Oral Daily   budesonide (PULMICORT) nebulizer solution  0.5 mg Nebulization BID   carvedilol  12.5 mg Oral BID WC   Chlorhexidine Gluconate Cloth  6 each Topical Q0600   clopidogrel  75 mg Oral Daily   [START ON 05/07/2021] darbepoetin (ARANESP) injection - DIALYSIS  100 mcg Intravenous Q Fri-HD   diclofenac Sodium  2 g Topical QID   fluticasone  2 spray Each Nare Daily   furosemide  40 mg Oral Q M,W,F   hydrocortisone  25 mg Rectal BID   insulin glargine  13 Units Subcutaneous BID   ketotifen  2 drop Both Eyes BID   lidocaine  2 patch Transdermal Q24H   losartan  25 mg Oral QPM   [START ON 05/05/2021] ondansetron  4 mg Oral Q M,W,F-HD   pantoprazole  40 mg Oral BID   rosuvastatin  20 mg Oral Daily   senna-docusate  2 tablet Oral BID   sevelamer carbonate  0.8 g Oral TID WC    Dialysis Orders: [facility]-MWF 4 hrs 180NRe 400/Autoflow 1.5 86.5 kg 2.0K/2.0 Ca TDC -heparin 2000 units IV TIW -Mircera 100 mcg IV q 2 weeks (last dose 04/14/2021 -Hectorol 5 mcg IV TIW   Assessment/Plan: Acute subcortical R CVA-per Primary/Neurology.  In CIR. Acute LGIB- Hx hemorrhoidal bleeding in past. new onset 06/25 post clopidogrel load. s/p 2 units PRBCS on 04/18/21.  Clopidogrel and ASA on hold. No heparin with HD. Per GI: EGD and flex sig with no etiology and bleeding scan (-).  Last Hgb 9.7. ESRD -  MWF schedule as an outpatient.  Off heparin d/t new GIB. Off schedule last week due to surgery.  Now back on schedule.  HD tomorrow. 1st stage R basilic vein transposition on 7/8 by Dr. Scot Dock.  Hypertension/volume - Losartan changed to PM dosing.  BP elevated today.  Does not appear volume overloaded.  Under dry weight, will need to be lowered on d/c. Anemia CKD - As noted in # 2. aranesp 182mcg weekly - changed to [place]. Transfuse PRN. Metabolic bone disease -  PTH  only 110 03/26/21 on 5 mcg Hectorol, hectorol was held at that time.  Rechecked pth 128. No need to restart VDRA.  Changed renvela to powder d/t patient inability to swallow pills.  Nutrition - note dysphagia diet DTM2- per primary Pleuritic abdominal/chest pain: improved today  Jen Mow, PA-C [facility] 05/04/2021,11:28 AM  LOS: 14 days

## 2021-05-04 NOTE — Patient Care Conference (Signed)
Inpatient RehabilitationTeam Conference and Plan of Care Update Date: 05/04/2021   Time: 11:20 AM    Patient Name: Carolyn Russo      Medical Record Number: [id number]  Date of Birth: [birth year] Sex: Female         Room/Bed: 4W03C/4W03C-01 Payor Info: Payor: MEDICARE / Plan: MEDICARE PART A AND B / Product Type: *No Product type* /    Admit Date/Time:  04/20/2021  3:53 PM  Primary Diagnosis:  Cerebrovascular accident (CVA) of right basal ganglia [facility])  Hospital Problems: Principal Problem:   Cerebrovascular accident (CVA) of right basal ganglia ([place]) Active Problems:   ESRD on dialysis [facility])   Bandemia    Expected Discharge Date: Expected Discharge Date: 05/12/21  Team Members Present: Physician leading conference: Dr. Courtney Heys Care Coodinator Present: Loralee Pacas, LCSWA;Beretta Ginsberg Creig Hines, RN, BSN, CRRN Nurse Present: Dorthula Nettles, RN PT Present: Excell Seltzer, PT OT Present: Roanna Epley, [place], OT SLP Present: Charolett Bumpers, SLP PPS Coordinator present : Gunnar Fusi, SLP     Current Status/Progress Goal Weekly Team Focus  Bowel/Bladder   Continent of bowel and bladder; LBM 05/03/21  Remain continent of bowel and bladder.  Assess bowel and bladder needs q shift and prn.   Swallow/Nutrition/ Hydration   dys 2 textures and thin liquids, min A  Supervision A  education, increasing alertness and dys 3 trials   ADL's   bathing at sink-min A; UB dressing-min A/supervision; LB dressing-min/mod A; functional transfers-min A/mod A; toileting-min A; onset of increase lethargy  supervision overall  activity tolerance, functional ranfsers, BADL retraining, attention to Lt; education   Mobility   CGA to mod A for transfers (inconsistent), gait 20-100 ft with RW min A (inconsistent), min A stairs  Supervision transfers and gait with LRAD; min A stairs  endurance, balance, gait, L attention, safety awareness   Communication   min- Supervision A impacted by  fatigue  supervision A - downgraded 7/12  education and speech intelligibility stratgeies   Safety/Cognition/ Behavioral Observations  min-mod A, delayed processing/initation verbal/physical  Min A  education, recall, awareness, basic problem solving and sustained attention   Pain   Patient rates abdominal pain 3/10  Patient rates pain < or equal to 3/10.  Assess pain q shift and medicate prn.   Skin   Patient skin is intact.  Patients skin to remain intact.  Assess skin q shift and prn.     Discharge Planning:  Pt to d/c to home with husband who is working on providing 24/7 care by hiring private aide until he comes home from work. [facility]HA- [facility].   Team Discussion: Questionable depression? Question Rowe Robert Syndrome? BP low but not able to increase medications. Will need to go home on O2. Incontinent at times. Not completing entire dialysis treatments. Complains of pain to hip and shoulder.  Patient on target to meet rehab goals: Inconsistent progress. Can walk over 100 ft or as little as 20 ft. Has supervision goals. Min/mod assist for ADL's. Has difficulty with problem solving. Not doing as well as a week ago. Lethargy is impacting her progress. Min/mod assist for cognition, memory, awareness, and initiation.  *See Care Plan and progress notes for long and short-term goals.   Revisions to Treatment Plan:  Extending to 7/20.  Teaching Needs: Family education, medication management, pain management, incontinence management, skin/wound care education, transfer training, gait training, balance training, endurance training, stair training, safety awareness.  Current Barriers to Discharge: Decreased caregiver support, Medical stability, Home  enviroment access/layout, Incontinence, Wound care, Lack of/limited family support, Weight, Hemodialysis, Weight bearing restrictions, Medication compliance, Behavior, and New oxygen  Possible Resolutions to Barriers: Continue current medications, provide  emotional support.     Medical Summary Current Status: incontinent at times; pain hip and shoulder/bursitis; sleep bad Sat/Sunday nite- declined to finish HD last 3 visits; O2 at night- not during therapy;  Barriers to Discharge: Behavior;Hemodialysis;Home enviroment access/layout;Incontinence;Medical stability;New oxygen;Weight;Weight bearing restrictions;Medication compliance  Barriers to Discharge Comments: husband working- so trying to arrange aids/care; has declined in last few days with lethargy- poor problem solving- gotten worse; Possible Resolutions to [employer] Focus: progress inconsistent- usually 196ft, but only 20 ft yesterday- R hand incoordination; lethargy impacting- CXR (-)- CT (-); cannot do D3 diet so far;  d/c will extend to 7/20- Wednesday   Continued Need for Acute Rehabilitation Level of Care: The patient requires daily medical management by a physician with specialized training in physical medicine and rehabilitation for the following reasons: Direction of a multidisciplinary physical rehabilitation program to maximize functional independence : Yes Medical management of patient stability for increased activity during participation in an intensive rehabilitation regime.: Yes Analysis of laboratory values and/or radiology reports with any subsequent need for medication adjustment and/or medical intervention. : Yes   I attest that I was present, lead the team conference, and concur with the assessment and plan of the team.   Cristi Loron 05/04/2021, 5:29 PM

## 2021-05-04 NOTE — Progress Notes (Signed)
Physical Therapy Session Note  Patient Details  Name: Carolyn Russo MRN: [id number] Date of Birth: 1951/07/21  Today's Date: 05/04/2021 PT Individual Time: 1300-1415; 5956-3875 PT Individual Time Calculation (min): 75 min and 15 min  Short Term Goals: Week 2:  PT Short Term Goal 1 (Week 2): =LTG due to ELOS  Skilled Therapeutic Interventions/Progress Updates:    Session 1: Pt received seated in bed, agreeable to PT session. Pt reports pain in R hip due to bursitis with mobility that improves at rest. Pt reports pain in L side of abdomen under her breast, declines intervention. Pt also continues to exhibit decreased coordination and control of RUE, reports numbness in her hand. Supine to sit with Supervision with use of bedrail. Sit to stand with min A and RW throughout session. Ambulatory transfer into bathroom with RW and min A, cues for safe RW management. Toilet transfer with RW and min A. Pt is independent for pericare, requires assist for clothing management. Ambulation 2 x 50 ft with RW and min A for balance. Pt requests to return to bed due to fatigue. Sit to supine Supervision. Once situated in bed pt reports onset of nausea, provided emesis bag. Pt with large amount of emesis, nursing notified. Pt's clothing and bedsheets soiled. Stand pivot transfer to chair with RW and min A. Assisted pt with changing clothing and bedsheets dependently for time conservation. Stand pivot transfer back to bed with RW and min A. Pt is min A for bed mobility for time conservation. Pt left seated in bed with needs in reach, bed alarm in place. Nursing able to provide zofran for nausea at end of session.  Session 2: Pt received seated in bed, reports improvement in nausea from earlier session. Pt with 2L O2 via [place] at rest, SpO2 100%. Pt reports ongoing pain under L breast, not rated and declines intervention. Pt reports urge to urinate. Bed mobility Supervision. Sit to stand with RW and min A. Ambulatory  transfer to bathroom with RW and min A. Pt is min A for toilet transfer, min A needed for clothing management, independent for pericare. Pt returns to bed at end of session. SpO2 100% on room air following activity. Sit to supine Supervision. Pt left seated in bed with needs in reach, bed alarm in place.  Therapy Documentation Precautions:  Precautions Precautions: Fall Restrictions Weight Bearing Restrictions: No    Therapy/Group: Individual Therapy   Excell Seltzer, PT, DPT, CSRS  05/04/2021, 5:04 PM

## 2021-05-04 NOTE — Progress Notes (Signed)
Occupational Therapy Session Note  Patient Details  Name: Carolyn Russo MRN: [id number] Date of Birth: February 17, 1951  Today's Date: 05/04/2021 OT Individual Time: 2482-5003 OT Individual Time Calculation (min): 57 min    Short Term Goals: Week 2:  OT Short Term Goal 2 (Week 2): STG=LTG 2/2 ELOS  Skilled Therapeutic Interventions/Progress Updates:    Pt received in room in bed on 2L O2 via [place] and consented to OT tx. Pt taken off O2, SPO2 taken after walk to bathroom with min A and RW and 99-100% on RA. Pt having increasingly difficult time problem solving today, required cuing for all steps of toileting, was not able to problem solve to tear off toilet paper and required assistance for that. Handed toilet paper to therapist and said, "I need you to wipe me" therapist asked if she could do it herself and she said yes. Pt req mod A overall for clothing mgmt and hygiene thoroughness. Pt then instructed to sit sink side to wash up, requiring min cues for initiation of task. Pt holds R arm in air and asks therapist "can you hold this arm up for me?" When she is doing it herself. Pt washes up UB with supervision, cuing to shut off water as she is about to overflow the basin in sink. Req min A for UBD. Pt noted to try to grasp sleeve in R hand- grasping air multiple times and undershooting before grabbing shirt sleeve. Pt continues to ask for assistance with shirt when instructed in donning technique, instructed to to what she can and therapist will help as needed. Pt seems self limiting today and is able to do more than she thinks she can. After donning shirt, pt became nauseas and asked for emi bag. After 3 min rest break, pt donned pants with min A to hike, able to thread legs through herself. Pt then began to vomit and requiring increased time to recover, PLB techs and then washed out mouth sink side with SUP as pt almost spills water out of cup she is holding. Pt inconsistent with alertness level and  answering questions throughout entire session. Asked if pt could hear therapist, and pt replied yes, but has very delayed responses to questions. Nurse [place] notified of status. VSS on RA. Pt then setup in w/c and handed off to RT after tx, seatbelt alarm on, all needs met.   Therapy Documentation Precautions:  Precautions Precautions: Fall Restrictions Weight Bearing Restrictions: No General:   Vital Signs: Therapy Vitals Temp: 97.9 F (36.6 C) Temp Source: Oral Pulse Rate: 61 Resp: 19 BP: (!) 154/47 Patient Position (if appropriate): Lying Oxygen Therapy SpO2: 100 % Pain: no c/o pain during session, only n/v      Therapy/Group: Individual Therapy  Enza Shone 05/04/2021, 7:44 AM

## 2021-05-05 ENCOUNTER — Inpatient Hospital Stay (HOSPITAL_COMMUNITY): Payer: Medicare Other

## 2021-05-05 ENCOUNTER — Encounter (HOSPITAL_COMMUNITY): Payer: Self-pay | Admitting: Physical Medicine and Rehabilitation

## 2021-05-05 LAB — CBC
HCT: 33.2 % — ABNORMAL LOW (ref 36.0–46.0)
Hemoglobin: 11.1 g/dL — ABNORMAL LOW (ref 12.0–15.0)
MCH: 29.2 pg (ref 26.0–34.0)
MCHC: 33.4 g/dL (ref 30.0–36.0)
MCV: 87.4 fL (ref 80.0–100.0)
Platelets: 391 10*3/uL (ref 150–400)
RBC: 3.8 MIL/uL — ABNORMAL LOW (ref 3.87–5.11)
RDW: 15.7 % — ABNORMAL HIGH (ref 11.5–15.5)
WBC: 10.1 10*3/uL (ref 4.0–10.5)
nRBC: 0 % (ref 0.0–0.2)

## 2021-05-05 LAB — RENAL FUNCTION PANEL
Albumin: 3.3 g/dL — ABNORMAL LOW (ref 3.5–5.0)
Anion gap: 8 (ref 5–15)
BUN: 14 mg/dL (ref 8–23)
CO2: 30 mmol/L (ref 22–32)
Calcium: 9.5 mg/dL (ref 8.9–10.3)
Chloride: 94 mmol/L — ABNORMAL LOW (ref 98–111)
Creatinine, Ser: 5.78 mg/dL — ABNORMAL HIGH (ref 0.44–1.00)
GFR, Estimated: 7 mL/min — ABNORMAL LOW (ref >60)
Glucose, Bld: 123 mg/dL — ABNORMAL HIGH (ref 70–99)
Phosphorus: 5.1 mg/dL — ABNORMAL HIGH (ref 2.5–4.6)
Potassium: 4.2 mmol/L (ref 3.5–5.1)
Sodium: 132 mmol/L — ABNORMAL LOW (ref 135–145)

## 2021-05-05 LAB — GLUCOSE, CAPILLARY
Glucose-Capillary: 73 mg/dL (ref 70–99)
Glucose-Capillary: 129 mg/dL — ABNORMAL HIGH (ref 70–99)
Glucose-Capillary: 152 mg/dL — ABNORMAL HIGH (ref 70–99)
Glucose-Capillary: 126 mg/dL — ABNORMAL HIGH (ref 70–99)

## 2021-05-05 IMAGING — CT CT HEAD W/O CM
3 series · 17 of 37 positions shown, 19 images · non-contrast
Comparison: Head CT dated [DATE].

CLINICAL DATA: 70-year-old female with neurologic deficit.

EXAM:
CT HEAD WITHOUT CONTRAST
TECHNIQUE: Contiguous axial images were obtained from the base of the skull
through the vertex without intravenous contrast.

[Series 3: head without · axial · non-contrast · 0.41mm/px · z∈[+417,+537]mm · 7 of 34 slices shown, 9 images]
[im 5/34  brain]
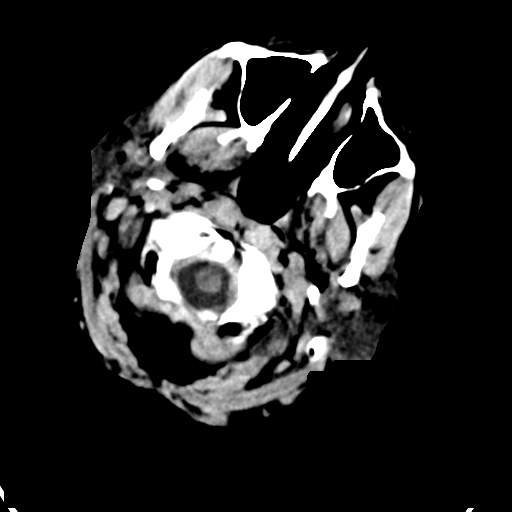
[im 5/34  bone]
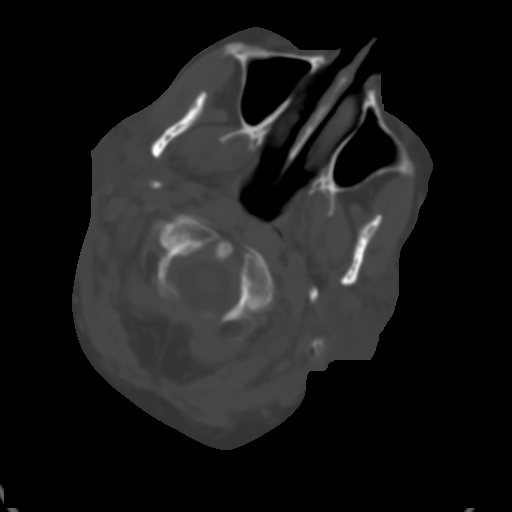
[im 9/34  brain]
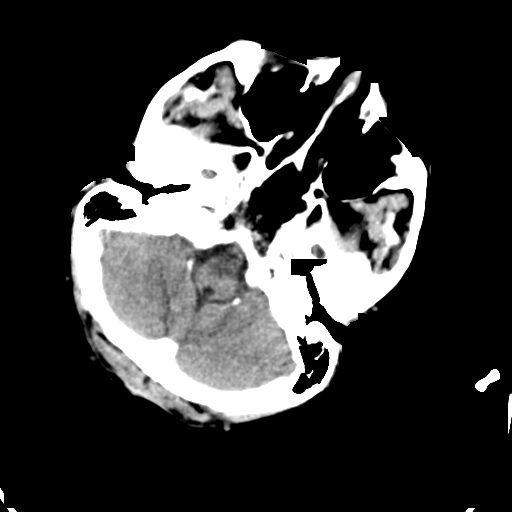
[im 13/34  brain]
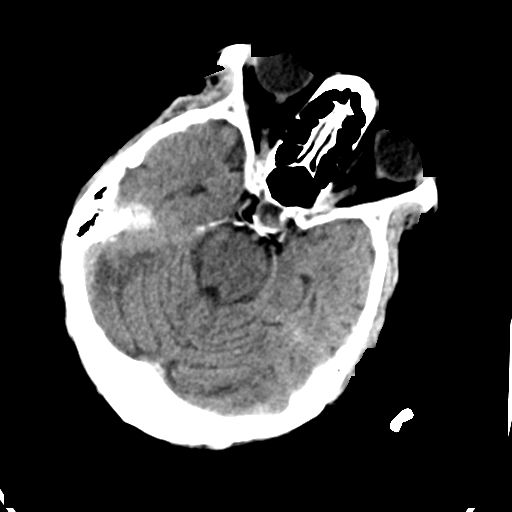
[im 17/34  brain]
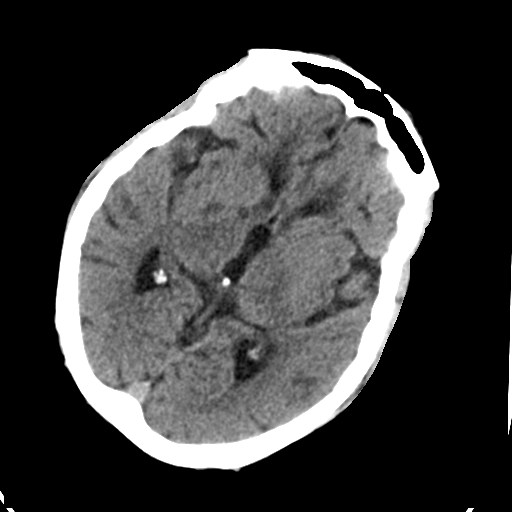
[im 21/34  brain]
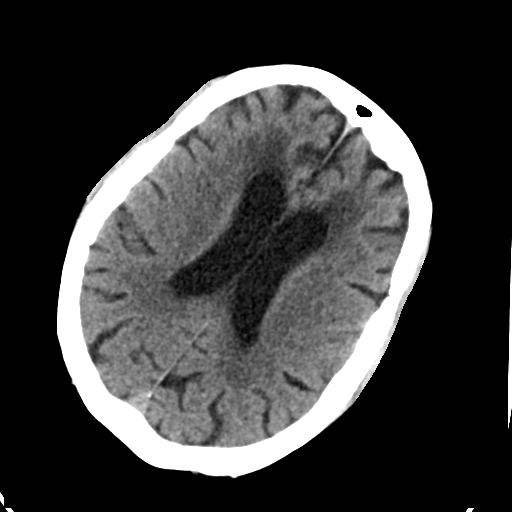
[im 21/34  bone]
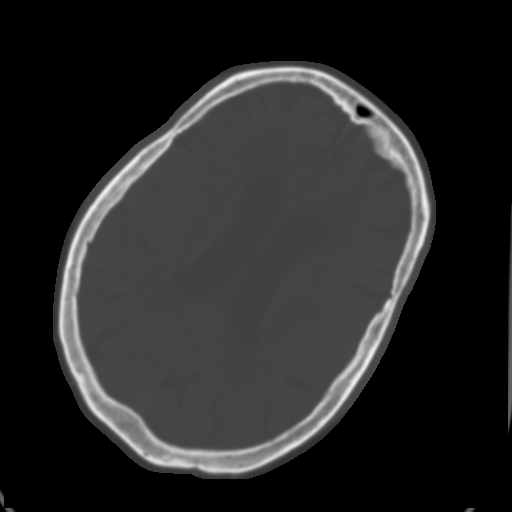
[im 25/34  brain]
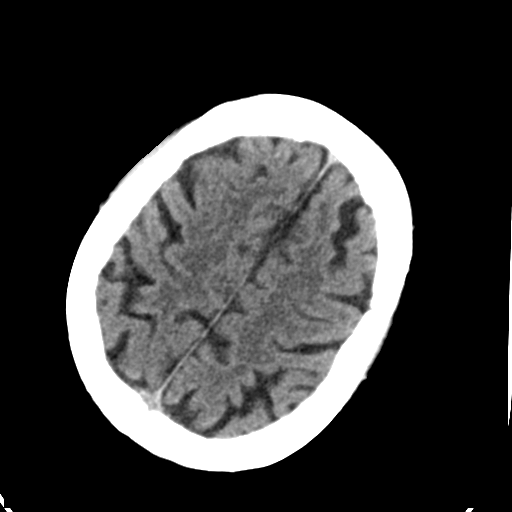
[im 29/34  brain]
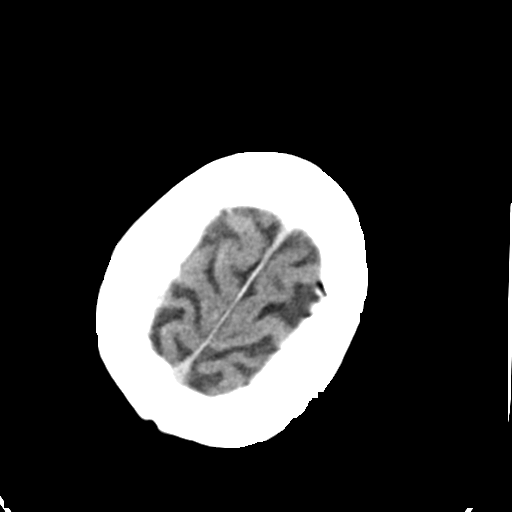

[Series 4: head bone · axial · 0.41mm/px · z∈[+413,+527]mm · 7 of 83 slices shown]
[im 9/83  bone]
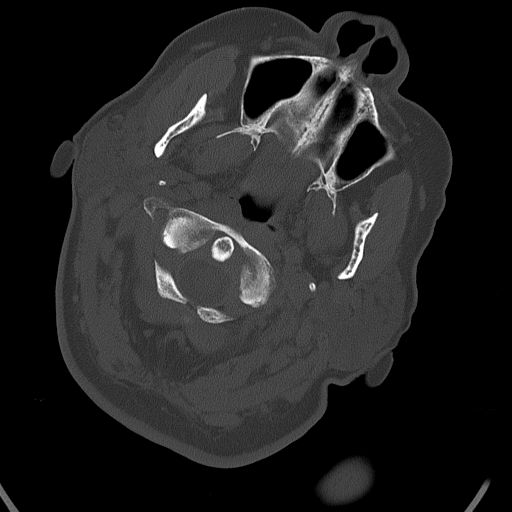
[im 17/83  bone]
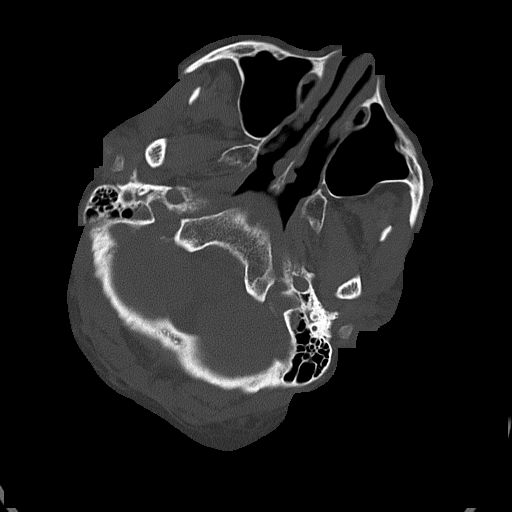
[im 25/83  bone]
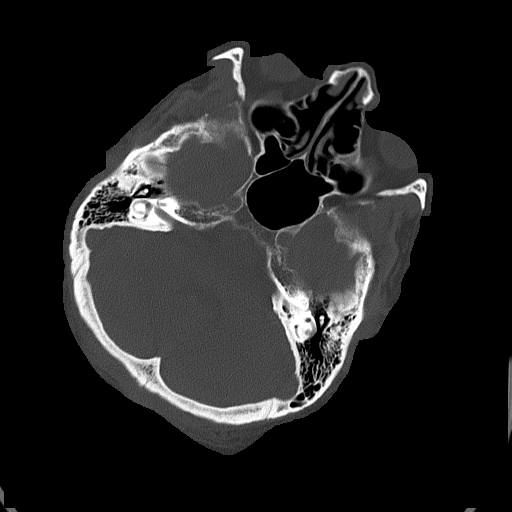
[im 37/83  bone]
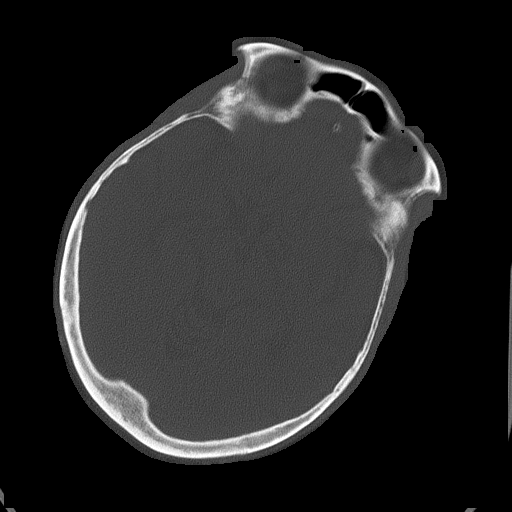
[im 46/83  bone]
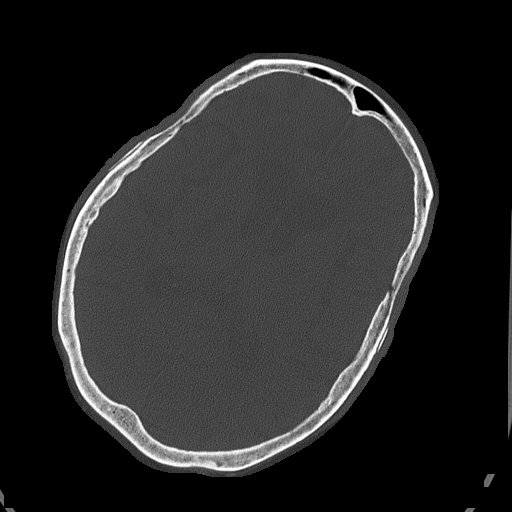
[im 58/83  bone]
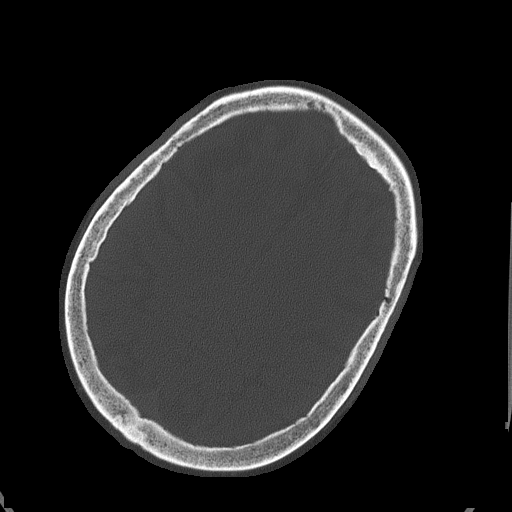
[im 66/83  bone]
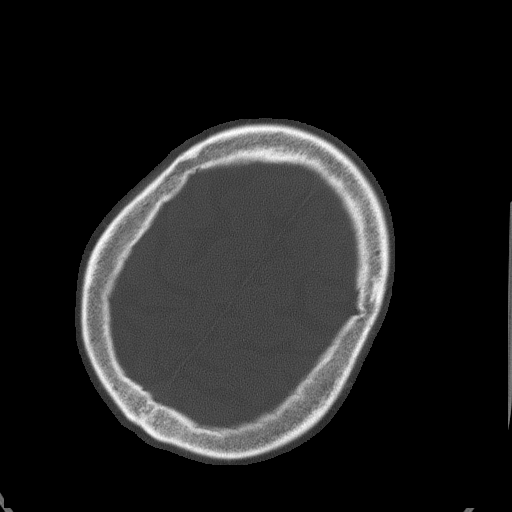

[Series 6: head without sag · sagittal · non-contrast · 0.32mm/px · 3 of 67 slices shown]
[im 26/67  brain]
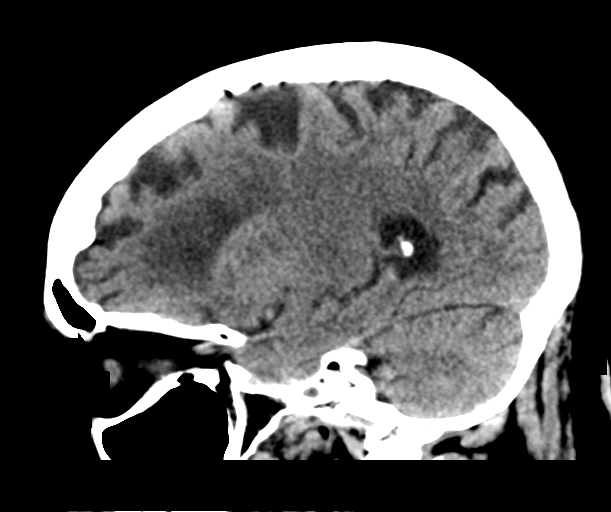
[im 34/67  brain]
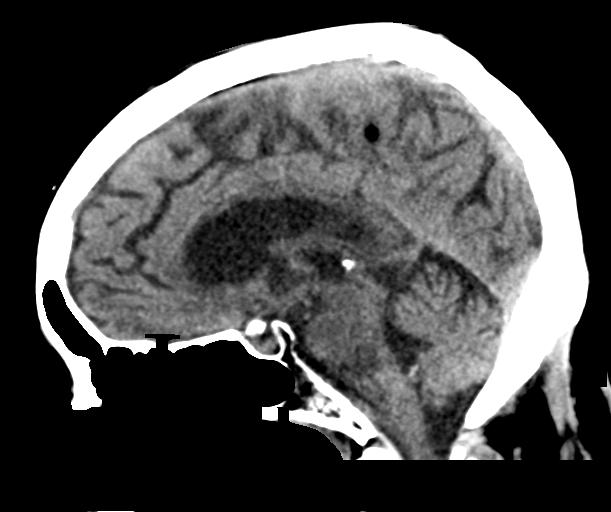
[im 42/67  brain]
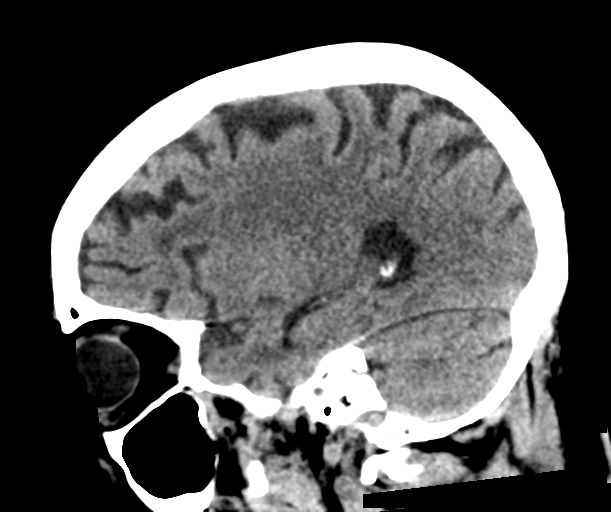

[17 of 37 positions shown; findings below may reference images not displayed]

FINDINGS: Brain: Mild age-related atrophy and moderate chronic microvascular
ischemic changes. An area of infarct again noted lateral to the
right thalamus, seen on the prior CT. There is no acute intracranial
hemorrhage. No mass effect or midline shift. No extra-axial fluid
collection.

Vascular: No hyperdense vessel or unexpected calcification.

Skull: Normal. Negative for fracture or focal lesion.

Sinuses/Orbits: The visualized paranasal sinuses and mastoid air
cells are clear. Bilateral maxillary sinus surgeries.

Other: None
IMPRESSION: 1. No acute intracranial pathology.
2. Age-related atrophy and chronic microvascular ischemic changes.
Similar appearance of focal area of old infarct lateral to the right
thalamus.

## 2021-05-05 IMAGING — DX DG CHEST 1V PORT
1 series · 1 of 1 positions shown · non-contrast
Comparison: [DATE]

CLINICAL DATA: Shortness of breath

EXAM:
PORTABLE CHEST 1 VIEW

[chest ap]
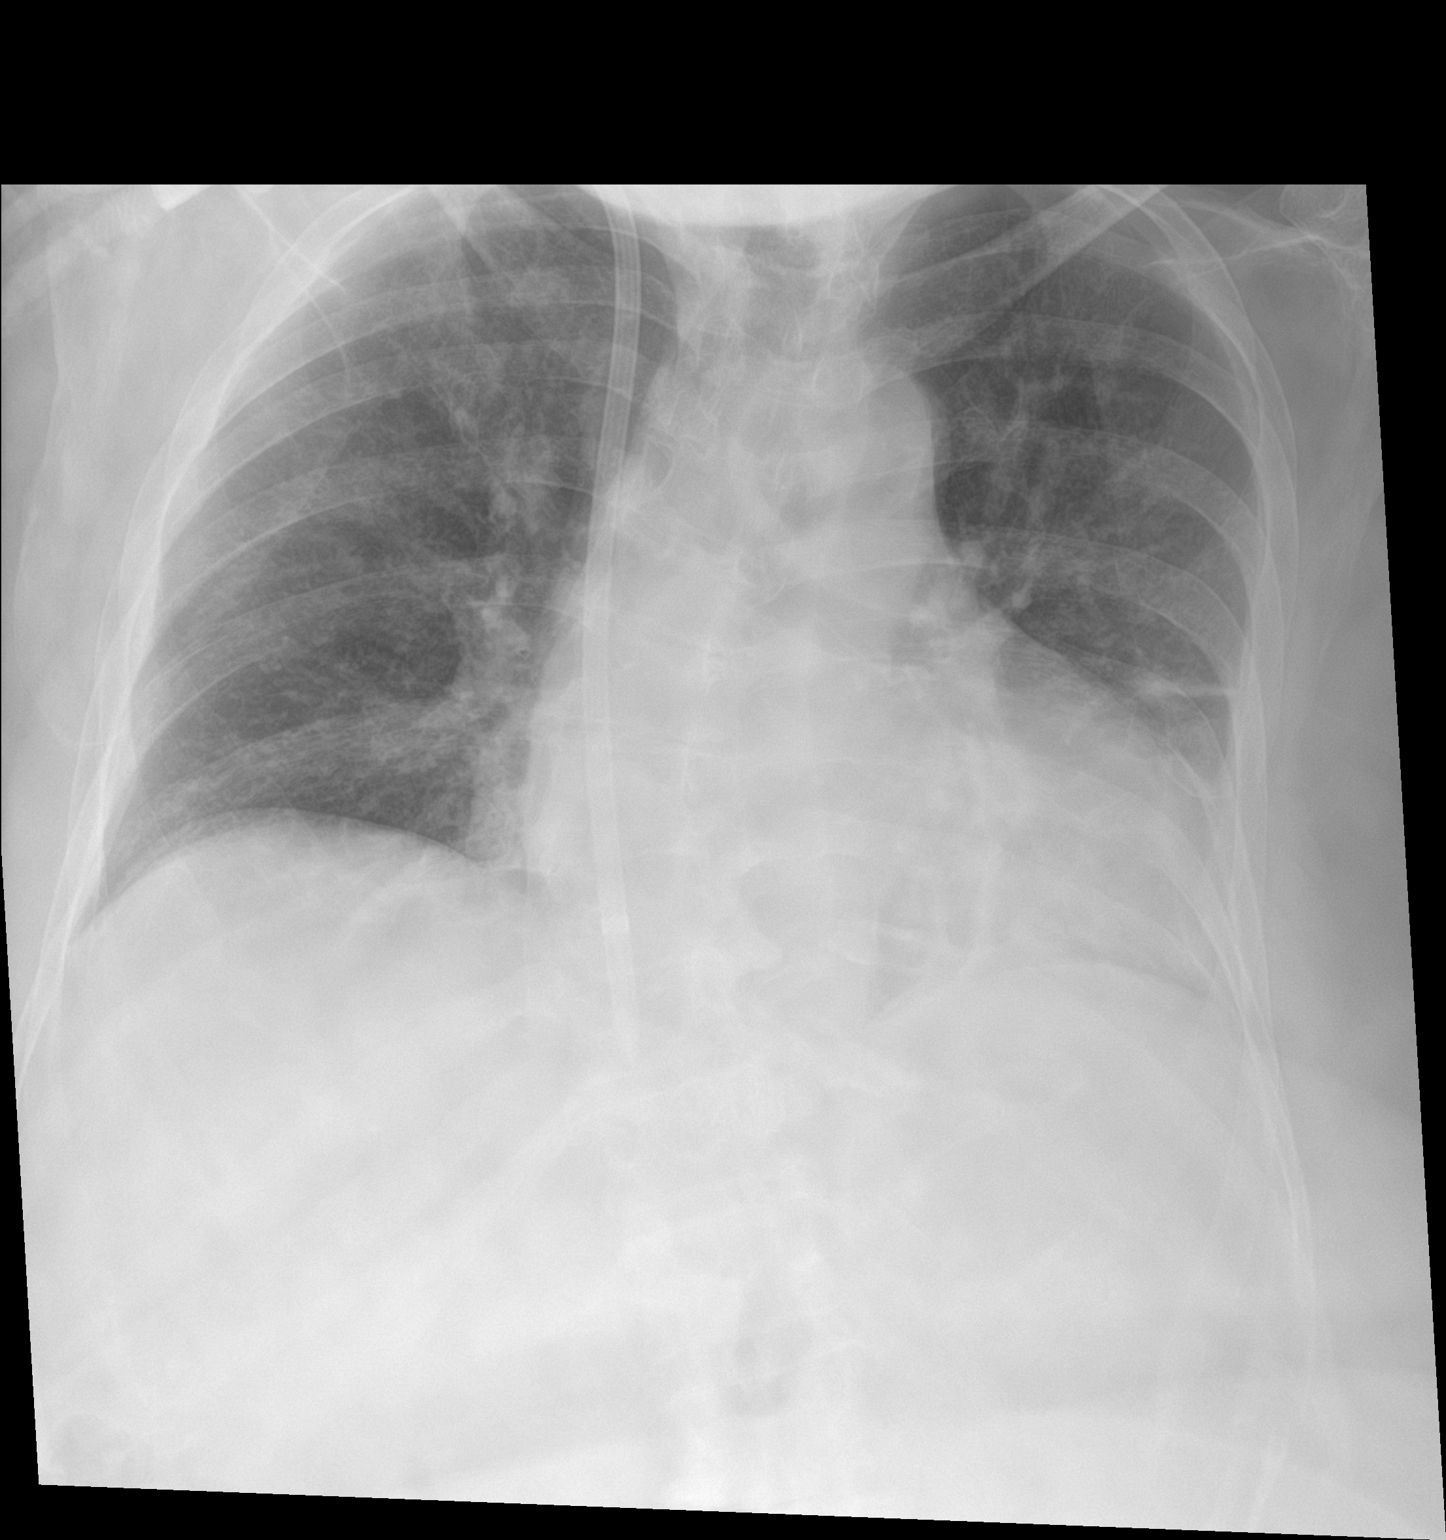

[1 of 1 positions shown; findings below may reference images not displayed]

FINDINGS: Cardiac shadow is mildly enlarged but stable. Right jugular central
line is again seen deep in the right atrium but stable. Lungs are
well aerated bilaterally with patchy stable atelectasis/scarring on
the left. No acute abnormality is seen.
IMPRESSION: No acute abnormality noted. Stable changes in the left base are
seen.

## 2021-05-05 MED ORDER — PENTAFLUOROPROP-TETRAFLUOROETH EX AERO: 1.0000 "application " | INHALATION_SPRAY | CUTANEOUS | Status: DC | PRN

## 2021-05-05 MED ORDER — HYDROCORTISONE NA SUCCINATE PF 250 MG IJ SOLR
200.0000 mg | Freq: Once | INTRAMUSCULAR | Status: AC
  Administered 2021-05-05: 200 mg via INTRAVENOUS
  Filled 2021-05-05: qty 200

## 2021-05-05 MED ORDER — SODIUM CHLORIDE 0.9 % IV SOLN: Freq: Once | INTRAVENOUS | Status: AC

## 2021-05-05 MED ORDER — DIPHENHYDRAMINE HCL 25 MG PO CAPS: 50.0000 mg | ORAL_CAPSULE | Freq: Once | ORAL | Status: DC

## 2021-05-05 MED ORDER — MELATONIN 3 MG PO TABS
3.0000 mg | ORAL_TABLET | Freq: Every day | ORAL | Status: DC
  Filled 2021-05-05: qty 1

## 2021-05-05 MED ORDER — IOHEXOL 350 MG/ML SOLN
100.0000 mL | Freq: Once | INTRAVENOUS | Status: AC | PRN
  Administered 2021-05-05: 100 mL via INTRAVENOUS

## 2021-05-05 MED ORDER — LIDOCAINE HCL (PF) 1 % IJ SOLN: 5.0000 mL | INTRAMUSCULAR | Status: DC | PRN

## 2021-05-05 MED ORDER — ALPRAZOLAM 0.5 MG PO TABS
ORAL_TABLET | ORAL | Status: AC
  Filled 2021-05-05: qty 1

## 2021-05-05 MED ORDER — DIPHENHYDRAMINE HCL 50 MG/ML IJ SOLN
50.0000 mg | Freq: Once | INTRAMUSCULAR | Status: DC
  Filled 2021-05-05: qty 1

## 2021-05-05 MED ORDER — HEPARIN SODIUM (PORCINE) 1000 UNIT/ML IJ SOLN
INTRAMUSCULAR | Status: AC
  Filled 2021-05-05: qty 4

## 2021-05-05 MED ORDER — SODIUM CHLORIDE 0.9 % IV SOLN: 100.0000 mL | INTRAVENOUS | Status: DC | PRN

## 2021-05-05 MED ORDER — ALTEPLASE 2 MG IJ SOLR: 2.0000 mg | Freq: Once | INTRAMUSCULAR | Status: DC | PRN

## 2021-05-05 MED ORDER — LIDOCAINE-PRILOCAINE 2.5-2.5 % EX CREA: 1.0000 "application " | TOPICAL_CREAM | CUTANEOUS | Status: DC | PRN

## 2021-05-05 MED ORDER — HEPARIN SODIUM (PORCINE) 1000 UNIT/ML DIALYSIS: 1000.0000 [IU] | INTRAMUSCULAR | Status: DC | PRN

## 2021-05-05 MED ORDER — HYDROCORTISONE NA SUCCINATE PF 250 MG IJ SOLR
200.0000 mg | Freq: Once | INTRAMUSCULAR | Status: DC
  Filled 2021-05-05: qty 200

## 2021-05-05 MED ORDER — DIPHENHYDRAMINE HCL 50 MG/ML IJ SOLN
50.0000 mg | Freq: Once | INTRAMUSCULAR | Status: AC
  Administered 2021-05-05: 50 mg via INTRAVENOUS
  Filled 2021-05-05: qty 1

## 2021-05-05 NOTE — Progress Notes (Signed)
Provided spouse number per request - Algis Liming, PA

## 2021-05-05 NOTE — Progress Notes (Signed)
Notified by [facility]( David),to inform assigned nurse of transfer to acute- inpatient care and on call PA,Patient is currently in CT Radiology.  assigned nurse informed

## 2021-05-05 NOTE — Progress Notes (Signed)
Writer spoke to [place]. Carolyn Russo re: Pt status and transfer to acute -inpatient care. PA order to put order for discharge to acute inpatient care.

## 2021-05-05 NOTE — Progress Notes (Signed)
Pt arrived from dialysis oriented to self only, mumbling. Declined dinner. Able to take meds po crushed in applesauce. Nurse conveyed concerns of AMS to charge nurse. Charge nurse assessed pt and contact on call PA who suggested contacting Rapid Response team.

## 2021-05-05 NOTE — Progress Notes (Signed)
Primary nurse reported that pt became confused and was disoriented after returning from HD. Pt alert, oriented to self, disoriented to place and time. Vital signs at baseline. On call provider, Zella Ball, NP and Jeannene Patella, [place] notified. Rapid response contacted for further evaluation. RR in room assessing pt at this time.   Gerald Stabs, RN

## 2021-05-05 NOTE — Progress Notes (Signed)
Occupational Therapy Session Note  Patient Details  Name: Carolyn Russo MRN: [id number] Date of Birth: 1951-06-24  Today's Date: 05/05/2021 OT Individual Time: 0800-0910 OT Individual Time Calculation (min): 70 min    Short Term Goals: Week 2:  OT Short Term Goal 2 (Week 2): STG=LTG 2/2 ELOS   Skilled Therapeutic Interventions/Progress Updates:    Pt received in bed in room with husband at bedside and consented to OT tx. Session focused on ADL routine including bathing, dressing, grooming, and self feeding. Pt Required mod encouragement to do for herself during ADLs, able to wash up and groom sink side with min A for thoroughness, cuing for initiation and proficiency. Pt required min A for UBD to pull shirt down in back, min A to hike over R hip as R hand is weak with decreased coordination. Encouraged to do as much as she can for herself. Pt cued to push from w/c for all STS's for LB bathing and clothing mgmt and used sink for CGA balance. Pt becomes fatigued very easily, however remains alert throughout entire session (I think having her husband there helped a lot). Instructed in self feeding activity and issued red tubing for built up utensils. Pt cued and therapist facilitated proper R hand grasp for utensils, pt able to feed self in and out of nausea waves. LPN administered nausea meds during treatment. ADL routine lasted entirety of session as pt is very slow moving and requires extensive rest breaks. After tx, [pt left up in w/c with all needs met, husband present.   Therapy Documentation Precautions:  Precautions Precautions: Fall Restrictions Weight Bearing Restrictions: No  Vital Signs: Therapy Vitals Temp: 97.8 F (36.6 C) Temp Source: Oral Pulse Rate: 64 Resp: 17 BP: (!) 140/59 Patient Position (if appropriate): Lying Oxygen Therapy SpO2: 100 % O2 Device: Nasal Cannula O2 Flow Rate (L/min): 2 L/min Pain: pt c/o nausea and pain under L breast, MD aware       Therapy/Group: Individual Therapy  Aletheia Tangredi 05/05/2021, 7:39 AM

## 2021-05-05 NOTE — Progress Notes (Signed)
Dr. Cheral Marker called  back to Pt's husband.

## 2021-05-05 NOTE — Progress Notes (Signed)
Message via Amnion sent to MD. Bruce Donath re: Pt's husband would like to talk to him about Pt's status.

## 2021-05-05 NOTE — Progress Notes (Signed)
Physical Therapy Session Note  Patient Details  Name: Carolyn Russo MRN: [id number] Date of Birth: 12/08/1950  Today's Date: 05/05/2021 PT Individual Time: 1035-1128 PT Individual Time Calculation (min): 53 min   Short Term Goals: Week 1:  PT Short Term Goal 1 (Week 1): Pt will complete least restrictive transfers with min A PT Short Term Goal 1 - Progress (Week 1): Met PT Short Term Goal 2 (Week 1): Pt will initiate stair training PT Short Term Goal 2 - Progress (Week 1): Met PT Short Term Goal 3 (Week 1): Pt will ambulate x 50 ft with LRAD and min A PT Short Term Goal 3 - Progress (Week 1): Met PT Short Term Goal 4 (Week 1): Pt will tolerate sitting OOB in chair x 1 hour PT Short Term Goal 4 - Progress (Week 1): Met Week 2:  PT Short Term Goal 1 (Week 2): =LTG due to ELOS PT Short Term Goal 1 - Progress (Week 2): Progressing toward goal Week 3:  PT Short Term Goal 1 (Week 3): =LTG due to ELOS  Skilled Therapeutic Interventions/Progress Updates:  Pt received sitting in wheelchair and agreeable to physical therapy session. Gait belt placed prior to initiation of mobility. Pt requested to change from shirt to gown. Total assist from therapist for time management.   STS transfers throughout session were completed with RW CGA and verbal/visual cuing for hand placement and anterior weight shift. Pt demonstrated decreased awareness body positioning in relation to target sitting surface. External cues of the target surface on the back of legs and verbal cuing to reaching for wc/mat table/bed prior to initiating the decent phase to assist in spatial awareness and safety.   Pt's husband stated he is setting a step to assist in getting into 29 inch tall  bed at home.  Practiced set up with yoga step next to apartment simulation bed. Therapist demonstrated backwards step up technique onto yoga step with RW. Pt completed activity 2x with RW and CGA to minA for balance with backwards step up. Pt  verbalized sequencing to step up with the RLE and down with the LLE. Verbal cuing given to fully place LLE onto the step prior to taking another step closer to the bed.   Pt ambulated 20 ft x 2 RW CGA. ~13 feet RW CGA into pt's room to the bed with a standing rest break sink side for hand hygiene. Tactile cuing for increased weight shift towards right for improved standing posture and balance. Pt demonstrated decreased gait velocity with step through gait pattern and intermittent forward flexed trunk.  Verbal cuing for upright posture, heel strike, and maintaining COG within RW BOS.    Frequent rest breaks given throughout session secondary to fatigue.   Pt returned to supine in bed with HOB elevated and bed alarm on.  No further needs expressed at this time.    Therapy Documentation Precautions:  Precautions Precautions: Fall Restrictions Weight Bearing Restrictions: No  Pain: Pain Assessment Pain Scale: 0-10 Pain Score: 5  Pain Location: Abdomen   Therapy/Group: Individual Therapy   , SPT 05/05/2021, 11:37 AM  

## 2021-05-05 NOTE — Progress Notes (Signed)
Speech Language Pathology Daily Session Note  Patient Details  Name: Carolyn Russo MRN: [id number] Date of Birth: 1950-12-13  Today's Date: 05/05/2021 SLP Individual Time: 0934-1030 SLP Individual Time Calculation (min): 56 min  Short Term Goals: Week 2: SLP Short Term Goal 1 (Week 2): STG=LTG  Skilled Therapeutic Interventions: Pt seen for skilled ST with focus on swallowing and cognition goals, husband present throughout. Pt lethargic throughout tx session following OT session, also complaining of waves of nausea (nursing previously administered Zofran). Pt agreeable to 2 bites Dys 3 texture before deferring further trials, mild extended mastication and mild delayed swallow trigger 2' lethargy, no overt s/s aspiration or change in vocal quality. Pt oriented x4 and recalling biographical information with supervision level cues. Simple problem solving task related to home environment requiring max fading to mod A cues to increase variety and accuracy of responses (I.e. pt would be repeat "pray" or "911"). SLP would often present prompt and pt wouldn't respond for 15-30 seconds appearing to be asleep, however when cued for response pt would recall/recite prompt with 100% accuracy. Discussed goals of ST with pt and husband including increasing initiation and response time. Husband states pt flat affect has been a drastic change. Pt left in wheelchair with husband present and all needs met. Cont ST POC.   Pain Pain Assessment Pain Scale: 0-10 Pain Score: 5  Pain Location: Abdomen  Therapy/Group: Individual Therapy  Dewaine Conger 05/05/2021, 10:38 AM

## 2021-05-05 NOTE — Progress Notes (Signed)
[facility] Progress Note   Subjective:   Patient seen and examined in room.  No change in abdominal pain and nausea per patient.  Currently sitting in wheelchair eating breakfast.  No new complaints.   Family at bedside requesting disability paperwork from [place] - Discussed with primary nephrologist who will complete it.   Objective Vitals:   05/04/21 2137 05/05/21 0029 05/05/21 0431 05/05/21 0500  BP:  (!) 132/59 (!) 140/59   Pulse:  66 64   Resp:  18 17   Temp:  98.3 F (36.8 C) 97.8 F (36.6 C)   TempSrc:   Oral   SpO2: 99% 100% 100%   Weight:    81.8 kg  Height:       Physical Exam General:well appearing female in NAD, sitting in wheelchair, more alert today Heart:RRR, no mrg Lungs:CTAB, nml WOB Abdomen:soft, NTND Extremities:no LE edema Dialysis Access: [facility], RU AVF maturing +b/t   Filed Weights   05/03/21 1548 05/04/21 0527 05/05/21 0500  Weight: 80.9 kg 82.1 kg 81.8 kg    Intake/Output Summary (Last 24 hours) at 05/05/2021 1246 Last data filed at 05/05/2021 0245 Gross per 24 hour  Intake 120 ml  Output 2 ml  Net 118 ml    Additional Objective Labs: Basic Metabolic Panel: Recent Labs  Lab 05/01/21 0553 05/03/21 1044 05/05/21 1131  NA 132* 131* 132*  K 4.2 4.8 4.2  CL 98 97* 94*  CO2 27 25 30   GLUCOSE 163* 306* 123*  BUN 23 21 14   CREATININE 5.50* 5.90* 5.78*  CALCIUM 8.8* 8.6* 9.5  PHOS 4.7* 4.8* 5.1*   Liver Function Tests: Recent Labs  Lab 05/01/21 0553 05/03/21 1044 05/05/21 1131  ALBUMIN 2.9* 2.8* 3.3*   CBC: Recent Labs  Lab 04/29/21 1325 04/30/21 0508 05/01/21 0553 05/03/21 1044 05/05/21 1131  WBC 12.1* 14.4* 13.0* 12.8* 10.1  NEUTROABS  --   --  6.9 7.9*  --   HGB 10.0* 9.9* 9.8* 9.7* 11.1*  HCT 29.5* 28.3* 28.4* 29.5* 33.2*  MCV 86.8 84.2 86.1 88.6 87.4  PLT 261 254 238 299 391   CBG: Recent Labs  Lab 05/04/21 0558 05/04/21 1119 05/04/21 1614 05/04/21 2108 05/05/21 0620  GLUCAP 117* 178* 188* 145* 73     Studies/Results: DG Chest 2 View  Result Date: 05/03/2021 CLINICAL DATA:  70 year old female with shortness of breath. EXAM: CHEST - 2 VIEW COMPARISON:  Chest radiograph dated 04/29/2021. FINDINGS: Dialysis catheter with tip in similar position close to the cavoatrial junction. No focal consolidation, pleural effusion or pneumothorax. Left lung base atelectasis. Stable cardiac silhouette. No acute osseous pathology. Degenerative changes of the spine. IMPRESSION: No active cardiopulmonary disease. Electronically Signed   By: Anner Crete M.D.   On: 05/03/2021 21:23   CT HEAD WO CONTRAST  Result Date: 05/03/2021 CLINICAL DATA:  Mental status changes of unknown cause. EXAM: CT HEAD WITHOUT CONTRAST TECHNIQUE: Contiguous axial images were obtained from the base of the skull through the vertex without intravenous contrast. COMPARISON:  04/18/2021 FINDINGS: Brain: No focal abnormality seen affecting the brainstem or cerebellum. Subacute infarction in the right lateral thalamus/posterior limb internal capsule is more clearly visible. No evidence of extension. Chronic small-vessel ischemic changes elsewhere throughout the cerebral hemispheric white matter as seen previously. No sign of new insult, mass, hemorrhage, hydrocephalus or extra-axial collection. Vascular: There is atherosclerotic calcification of the major vessels at the base of the brain. Skull: Negative Sinuses/Orbits: Clear sinuses. Previous functional endoscopic sinus surgery. Orbits negative.  Other: None IMPRESSION: No acute CT finding. Subacute infarction in the right lateral thalamus/radiating white matter tracts is more clearly visible but there is no sign of extension or hemorrhage. Chronic small-vessel ischemic changes elsewhere affecting the cerebral hemispheric white matter. Electronically Signed   By: Nelson Chimes M.D.   On: 05/03/2021 19:06    Medications:  sodium chloride 10 mL/hr at 04/30/21 1249   sodium chloride     sodium  chloride      arformoterol  15 mcg Nebulization BID   aspirin EC  81 mg Oral Daily   budesonide (PULMICORT) nebulizer solution  0.5 mg Nebulization BID   carvedilol  12.5 mg Oral BID WC   Chlorhexidine Gluconate Cloth  6 each Topical Q0600   clopidogrel  75 mg Oral Daily   [START ON 05/07/2021] darbepoetin (ARANESP) injection - DIALYSIS  100 mcg Intravenous Q Fri-HD   diclofenac Sodium  2 g Topical QID   fluticasone  2 spray Each Nare Daily   furosemide  40 mg Oral Q M,W,F   hydrocortisone  25 mg Rectal BID   insulin glargine  13 Units Subcutaneous BID   ketotifen  2 drop Both Eyes BID   lidocaine  2 patch Transdermal Q24H   losartan  25 mg Oral QPM   melatonin  3 mg Oral QHS   ondansetron  4 mg Oral Q M,W,F-HD   pantoprazole  40 mg Oral BID   rosuvastatin  20 mg Oral Daily   senna-docusate  2 tablet Oral BID   sevelamer carbonate  0.8 g Oral TID WC    Dialysis Orders: [facility]-MWF 4 hrs 180NRe 400/Autoflow 1.5 86.5 kg 2.0K/2.0 Ca TDC -heparin 2000 units IV TIW -Mircera 100 mcg IV q 2 weeks (last dose 04/14/2021 -Hectorol 5 mcg IV TIW   Assessment/Plan: Acute subcortical R CVA-per Primary/Neurology.  In CIR. Acute LGIB- Hx hemorrhoidal bleeding in past. new onset 06/25 post clopidogrel load. s/p 2 units PRBCS on 04/18/21.  Clopidogrel and ASA on hold. No heparin with HD. Per GI: EGD and flex sig with no etiology and bleeding scan (-).  Hgb today 11.1. ESRD -  MWF schedule as an outpatient.  Off heparin d/t new GIB. Off schedule last week due to surgery.  Now back on schedule.  HD planned for today. 1st stage R basilic vein transposition on 7/8 by Dr. Scot Dock.  Hypertension/volume - BP variable. Losartan changed to PM dosing.  Does not appear volume overloaded.  Under dry weight, will need to be lowered on d/c ~81kg. Anemia CKD - As noted in # 2. aranesp 138mcg weekly - changed to [place]. Transfuse PRN. Metabolic bone disease -  PTH only 110 03/26/21 on 5 mcg Hectorol, hectorol was  held at that time.  Rechecked pth 128. No need to restart VDRA.  Changed renvela to powder d/t patient inability to swallow pills.  Nutrition - note dysphagia diet DTM2- per primary Pleuritic abdominal/chest pain: improved today  Jen Mow, PA-C [place] 05/05/2021,12:46 PM  LOS: 15 days

## 2021-05-05 NOTE — Progress Notes (Signed)
   05/05/21 1812  Assess: MEWS Score  Temp 98.1 F (36.7 C)  BP (!) 143/126  Pulse Rate 83  Resp (!) 28  SpO2 100 %  O2 Device Room Air  Assess: MEWS Score  MEWS Temp 0  MEWS Systolic 0  MEWS Pulse 0  MEWS RR 2  MEWS LOC 0  MEWS Score 2  MEWS Score Color Yellow  Assess: if the MEWS score is Yellow or Red  Were vital signs taken at a resting state? Yes  Focused Assessment Change from prior assessment (see assessment flowsheet)  Early Detection of Sepsis Score *See Row Information* Medium  MEWS guidelines implemented *See Row Information* Yes  Treat  MEWS Interventions Escalated (See documentation below)  Pain Scale Faces  Faces Pain Scale 2  Pain Type Acute pain  Pain Location Abdomen (and LLE)  Pain Intervention(s) Repositioned  Take Vital Signs  Increase Vital Sign Frequency  Yellow: Q 2hr X 2 then Q 4hr X 2, if remains yellow, continue Q 4hrs  Escalate  MEWS: Escalate Yellow: discuss with charge nurse/RN and consider discussing with provider and RRT  Notify: Charge Nurse/RN  Name of Charge Nurse/RN Notified [place]  Date Charge Nurse/RN Notified 05/05/21  Time Charge Nurse/RN Notified 1975  Notify: Provider  Provider Name/Title Burna Forts, Zella Ball, NP  Date Provider Notified 05/05/21  Time Provider Notified 1800  Notification Type Call  Notification Reason Change in status  Provider response At bedside;See new orders  Date of Provider Response 05/05/21  Time of Provider Response 1800  Notify: Rapid Response  Name of Rapid Response RN Notified [place] RN  Date Rapid Response Notified 05/05/21  Time Rapid Response Notified [id number]  Document  Patient Outcome Other (Comment) (AMS, remains on unit)  Progress note created (see row info) Yes

## 2021-05-05 NOTE — Progress Notes (Signed)
PROGRESS NOTE   Subjective/Complaints:   Pt was vomiting yesterday per pt/husband- had good response to Zofran-  Per husband- pt sitting up awake when he came inr oom- called husband at 4:30am- usually gets up at 4am.  Slept a little better.  LBM yesterday- husband thinks she's depressed, but wanted to wait on meds- we discussed- will add Melatonin low dose.    ROS: Pt denies SOB, abd pain, CP, N/V/C/D, and vision changes   Objective:   CT HEAD WO CONTRAST  Result Date: 05/05/2021 CLINICAL DATA:  70 year old female with neurologic deficit. EXAM: CT HEAD WITHOUT CONTRAST TECHNIQUE: Contiguous axial images were obtained from the base of the skull through the vertex without intravenous contrast. COMPARISON:  Head CT dated 05/03/2021. FINDINGS: Brain: Mild age-related atrophy and moderate chronic microvascular ischemic changes. An area of infarct again noted lateral to the right thalamus, seen on the prior CT. There is no acute intracranial hemorrhage. No mass effect or midline shift. No extra-axial fluid collection. Vascular: No hyperdense vessel or unexpected calcification. Skull: Normal. Negative for fracture or focal lesion. Sinuses/Orbits: The visualized paranasal sinuses and mastoid air cells are clear. Bilateral maxillary sinus surgeries. Other: None IMPRESSION: 1. No acute intracranial pathology. 2. Age-related atrophy and chronic microvascular ischemic changes. Similar appearance of focal area of old infarct lateral to the right thalamus. Electronically Signed   By: Anner Crete M.D.   On: 05/05/2021 19:56   DG Chest Port 1 View  Result Date: 05/05/2021 CLINICAL DATA:  Shortness of breath EXAM: PORTABLE CHEST 1 VIEW COMPARISON:  05/03/2021 FINDINGS: Cardiac shadow is mildly enlarged but stable. Right jugular central line is again seen deep in the right atrium but stable. Lungs are well aerated bilaterally with patchy stable  atelectasis/scarring on the left. No acute abnormality is seen. IMPRESSION: No acute abnormality noted. Stable changes in the left base are seen. Electronically Signed   By: Inez Catalina M.D.   On: 05/05/2021 19:08   Recent Labs    05/03/21 1044 05/05/21 1131  WBC 12.8* 10.1  HGB 9.7* 11.1*  HCT 29.5* 33.2*  PLT 299 391    Recent Labs    05/03/21 1044 05/05/21 1131  NA 131* 132*  K 4.8 4.2  CL 97* 94*  CO2 25 30  GLUCOSE 306* 123*  BUN 21 14  CREATININE 5.90* 5.78*  CALCIUM 8.6* 9.5      Intake/Output Summary (Last 24 hours) at 05/05/2021 2004 Last data filed at 05/05/2021 1615 Gross per 24 hour  Intake 120 ml  Output 3001 ml  Net -2881 ml        Physical Exam: Vital Signs Blood pressure (!) 151/62, pulse 83, temperature 98.1 F (36.7 C), temperature source Oral, resp. rate (!) 28, height 5\' 2"  (1.575 m), weight 78.8 kg, SpO2 100 %.     General: awake, alert, appropriate,  up at sink with therapy cleaning up; NAD HENT: conjugate gaze; oropharynx moist- Carotids sound good CV: regular rate; no JVD Pulmonary: CTA B/L; no W/R/R- good air movement- sounds great GI: soft, NT, ND, (+)BS; hypoactive Psychiatric: appropriate- more awake Neurological: more alert  Musculoskeletal:        General:  No swelling.    Cervical back: Normal range of motion.    Comments: Mild tenderness left shoulder with ER/IR today  Skin:    General: Skin is warm and dry. Neurological:    Comments: alert, follows commands.   Fair insight and awareness. Provided biographical information. Normal language, speech slightly slurred. Mild left central 7. LUE 2 to 2+/5 deltoid, 3/5 biceps, triceps and 3+/5 wrist and HI. LLE 3-4/5 prox to distal. RUE and RLE 4-5/5 prox to distal. No sensory deficits noted--motor/sensory exam stable today.   Assessment/Plan: 1. Functional deficits which require 3+ hours per day of interdisciplinary therapy in a comprehensive inpatient rehab setting. Physiatrist  is providing close team supervision and 24 hour management of active medical problems listed below. Physiatrist and rehab team continue to assess barriers to discharge/monitor patient progress toward functional and medical goals  Care Tool:  Bathing    Body parts bathed by patient: Right arm, Left arm, Chest, Abdomen, Front perineal area, Right upper leg, Left upper leg   Body parts bathed by helper: Right lower leg, Left lower leg, Buttocks     Bathing assist Assist Level: Minimal Assistance - Patient > 75%     Upper Body Dressing/Undressing Upper body dressing   What is the patient wearing?: Pull over shirt    Upper body assist Assist Level: Minimal Assistance - Patient > 75%    Lower Body Dressing/Undressing Lower body dressing      What is the patient wearing?: Pants, Incontinence brief     Lower body assist Assist for lower body dressing: Minimal Assistance - Patient > 75%     Toileting Toileting    Toileting assist Assist for toileting: Moderate Assistance - Patient 50 - 74%     Transfers Chair/bed transfer  Transfers assist     Chair/bed transfer assist level: Minimal Assistance - Patient > 75%     Locomotion Ambulation   Ambulation assist      Assist level: Minimal Assistance - Patient > 75% Assistive device: Walker-rolling Max distance: 50'   Walk 10 feet activity   Assist     Assist level: Minimal Assistance - Patient > 75% Assistive device: Walker-rolling   Walk 50 feet activity   Assist Walk 50 feet with 2 turns activity did not occur: Safety/medical concerns  Assist level: Minimal Assistance - Patient > 75% Assistive device: Walker-rolling    Walk 150 feet activity   Assist Walk 150 feet activity did not occur: Safety/medical concerns         Walk 10 feet on uneven surface  activity   Assist Walk 10 feet on uneven surfaces activity did not occur: Safety/medical concerns         Wheelchair     Assist Will  patient use wheelchair at discharge?:  (TBD) Type of Wheelchair: Manual    Wheelchair assist level: Dependent - Patient 0% Max wheelchair distance: 150'    Wheelchair 50 feet with 2 turns activity    Assist        Assist Level: Dependent - Patient 0%   Wheelchair 150 feet activity     Assist      Assist Level: Dependent - Patient 0%   Blood pressure (!) 151/62, pulse 83, temperature 98.1 F (36.7 C), temperature source Oral, resp. rate (!) 28, height 5\' 2"  (1.575 m), weight 78.8 kg, SpO2 100 %.  Medical Problem List and Plan: 1.  Left side weakness secondary to nonhemorrhagic right basal ganglia infarction             -  patient may shower             -ELOS/Goals: 10-12 days, mod I goals for PT, OT, SLP  -con't CIR- PT, and OT and SLP 2.  Impaired mobility: -DVT/anticoagulation:  continue SCD             -antiplatelet therapy:ASA  Plavix restarted.  3. Right hip bursitis: continue Tramadol 50 mg every 12 hours as needed, ice, ROM, lidocaine patch 4. Anxiety: continue Xanax 0.5 mg 3 times daily as needed.  Provide emotional support  7/12- per husband, concerned about flat affect- will d/w possible depression with pt- if so, will start SSRI.   7/13- will wait for now per husband             -antipsychotic agents: N/A 5. Neuropsych: This patient is capable of making decisions on her own behalf. 6. Skin/Wound Care: Routine skin checks 7. Fluids/Electrolytes/Nutrition: Routine in and outs with follow-up chemistries 8.  Acute lower GI bleed with blood loss anemia.  Follow-up GI services.  Resume ASA as above. - Continue Protonix 40 mg twice daily 7/6: hgb 9.6--monitor for gross bleeding 7/11- last Hb stable at 9.8- will recheck this week  7/12- Hb came back at 9.7- stable- con't regimen  7/13- Hb up to 11.1- will con't to monitor 9.  Chronic diastolic congestive heart failure.  Monitor for any signs of fluid overload.  Continue Lasix 40-80 mg Monday Wednesday Friday              -daily weights, have been stable  7/12- weight bounced up 1 kg today, however was this level 2-3 days ago, so think yesterday's weight might have been slightly off- will monitor 10.  Newly Dx'd End-stage renal disease.  Hemodialysis as per renal services  7/12- questionable steal syndrome since Fistula placement- will monitor per Vascular- to see if  needs additional intervention 11.  Hypertension.  Increase Coreg to 12.5 mg twice daily. Aggressive filtration with ultradialysis tomorrow  7/11- HD today- BP 170s/90s this AM- per renal.   7/12- BP dropped to 70s/50s with HD yesterday- back up to 150s/80s today- con't reigmen 12.  Asthma.  Continue inhalers as directed  - was getting Home O2 prior to admission- will likely need to go home on 2L O2-   Was trying to get at home, but hadn't yet.   7/1- con't nebs prn, home O2 ?PRN only  7/3 lungs very clear today, sats 99% on RA. Some of SOB may be behaviorally related. Reassured pt today that lungs sounded fine.   -encouraged FV/IS, OOB  7/7: ordered CXT-stable, ordered incentive spirometer 13.Hyperlipidemia.Crestor 14.Obesity.BMI 34.31.Dietery follow up 15.  Diabetes mellitus.hemoglobin AIc 5.2.  CBGs 211-310. Increase Lantus to 13U BID.   16. Leukocytosis: trending down, repeat Monday  7/11- last WBC was 13k- will recheck in HD today.   7/13- WBC down to 10.1- will monitor 17. Constipation: KUB shows moderate stool burden. Increase senna-docusate to BID.   7/13- LBM yesterday- con't regimen  18. Fistula placement: successful, new fistula accessed without complications.  19. Left shoulder pain: ordered lidocaine patch and voltaren gel.  20. Lethargy  7/12- is much improved today- will con't to monitor- CT and CXR of chest were stable  7/13- will add Melatonin since not sleeping well.      LOS: 15 days A FACE TO FACE EVALUATION WAS PERFORMED  Iver Miklas 05/05/2021, 8:04 PM

## 2021-05-05 NOTE — Progress Notes (Signed)
Physical Therapy Weekly Progress Note  Patient Details  Name: Carolyn Russo MRN: [id number] Date of Birth: 03-06-1951  Beginning of progress report period: April 28, 2021 End of progress report period: May 05, 2021  Today's Date: 05/05/2021  Patient has met 0 of 1 short term goals.  STG were set to LTG due to originally estimated LOS. Patient's LOS was extended during team conference due to an overall decline in function for several days following RUE fistula placement. Pt was CGA with most transfers and mobility with RW at the end of last week and early this week presented at min to mod A level overall. Pt also exhibits decreased RUE control and coordination as well as ongoing lethargy limiting ability to functionally participate in therapy sessions. Pt will benefit from extended LOS in order to achieve overall Supervision level goals. Pt has exhibited inconsistent assist level over the past few days ranging from CGA to mod A for transfers and gait up to 50 ft with RW.  Patient continues to demonstrate the following deficits muscle weakness, decreased cardiorespiratoy endurance and decreased oxygen support, decreased coordination, decreased attention to left, decreased initiation, decreased attention, decreased awareness, decreased problem solving, decreased safety awareness, decreased memory, and delayed processing, and decreased sitting balance, decreased standing balance, decreased postural control, hemiplegia, and decreased balance strategies and therefore will continue to benefit from skilled PT intervention to increase functional independence with mobility.  Patient progressing toward long term goals..  Continue plan of care.  PT Short Term Goals Week 2:  PT Short Term Goal 1 (Week 2): =LTG due to ELOS PT Short Term Goal 1 - Progress (Week 2): Progressing toward goal Week 3:  PT Short Term Goal 1 (Week 3): =LTG due to ELOS  Therapy Documentation Precautions:  Precautions Precautions:  Fall Restrictions Weight Bearing Restrictions: No   Therapy/Group: Individual Therapy   Excell Seltzer, PT, DPT, CSRS 05/05/2021, 7:46 AM

## 2021-05-05 NOTE — Progress Notes (Signed)
Received a call from Charge Nurse, reporting change of mental status. Dr Dagoberto Ligas note was reviewed from 05/04/2021. Have Rapid response nurse to assess patient. Reesa Chew Pa-c, will cover call.

## 2021-05-05 NOTE — Progress Notes (Signed)
Patient appears to be very anxious on dialysis. Offered medication. Patient declines.

## 2021-05-05 NOTE — Consult Note (Signed)
Referring Physician: Dr. Dagoberto Ligas    Chief Complaint: Acute onset of left sided weakness, left hemineglect and rightward gaze deviation.   HPI: Carolyn Russo is an 70 y.o. female with a PMHx significant for hypertension, hyperlipidemia, CHF, ESRD on HD (MWF), insulin-dependent diabetes mellitus type 2, CAD, iron deficiency anemia, and asthma who initially presented to the hospital on 6/24 with complaints of left-sided weakness. Imaging revealed an acute right basal ganglia stroke. Neurology consultant recommended stroke workup and home ASA was increased to 325 mg daily. Plavix was added by the Stroke Team. She was continued on her statin. She was discharged to Inpatient Rehab on 6/28. She has improved significantly, being able to ambulate with some assistance, with good motor function and has been fully conversant. Today, after returning from dialysis, the patient was noted to have new onset of AMS, oriented to self only and with mumbling speech, in conjunction with worsened left sided weakness, left hemineglect and rightward gaze deviation. LKN was noon. BP was 143/126 with HR of 83 and RR of 28. Initial NIHSS performed by Rapid Response was 12. Prior documented NIHSS was 4. Code Stroke was called.   LSN: Noon tPA Given: No: Recent right basal ganglia stroke  Past Medical History:  Diagnosis Date   Allergy    Anemia    Anterior chest wall pain    Appendicitis 1965   Asthma    Body mass index 37.0-37.9, adult    Breast pain    Cataract    both eyes   CHF (congestive heart failure) ([place])    Chronic kidney disease    stage 5 - on dialysis   Cognitive change 04/20/2021   r/t cva 03/2821   Dehydration 2014   Deviated septum 1971   Diabetes mellitus    no meds   Dyspnea 2014   Extrinsic asthma    WITH ASTHMA ATTACK   Fibroid 1980   GERD (gastroesophageal reflux disease)    Heart murmur    Hx gestational diabetes    Hyperlipidemia    Hypertension 2014   Inguinal hernia 1959   Malaise  and fatigue 2014   Non-IgE mediated allergic asthma 2014   Obesity    Pelvic pain    Pregnancy, high-risk 1985   Stroke ([place]) 04/20/2021   (CVA) of right basal ganglia   Tonsillitis 1968   Uterine fibroid 1980    Past Surgical History:  Procedure Laterality Date   APPENDECTOMY  8657   BASCILIC VEIN TRANSPOSITION Right 04/30/2021   Procedure: RIGHT FIRST STAGE [place];  Surgeon: Angelia Mould, MD;  Location: [facility] OR;  Service: Vascular;  Laterality: Right;   CESAREAN SECTION  1985   COLONOSCOPY     ESOPHAGOGASTRODUODENOSCOPY (EGD) WITH PROPOFOL N/A 04/18/2021   Procedure: ESOPHAGOGASTRODUODENOSCOPY (EGD) WITH PROPOFOL;  Surgeon: Gatha Mayer, MD;  Location: [facility] ENDOSCOPY;  Service: Endoscopy;  Laterality: N/A;   EYE SURGERY     bilateral cataract    FLEXIBLE SIGMOIDOSCOPY N/A 04/18/2021   Procedure: FLEXIBLE SIGMOIDOSCOPY;  Surgeon: Gatha Mayer, MD;  Location: [facility] ENDOSCOPY;  Service: Endoscopy;  Laterality: N/A;   HERNIA REPAIR  1959   IR FLUORO GUIDE CV LINE RIGHT  03/18/2021   IR US GUIDE VASC ACCESS RIGHT  03/18/2021   LEFT HEART CATH AND CORONARY ANGIOGRAPHY N/A 04/04/2017   Procedure: Left Heart Cath and Coronary Angiography;  Surgeon: Belva Crome, MD;  Location: [place] CV LAB;  Service: Cardiovascular;  Laterality: N/A;  MYOMECTOMY  1980, 2004, 2007   RHINOPLASTY  1971   ROTATOR CUFF REPAIR  2003   SURGICAL REPAIR OF HEMORRHAGE  2015   TONSILLECTOMY  1968    Family History  Problem Relation Age of Onset   Cancer Mother        abdominal melamona   Psoriasis Mother    Alzheimer's disease Father    Cancer Cousin        colon    Diabetes Cousin    Diabetes Maternal Aunt    Colon cancer Neg Hx    Colon polyps Neg Hx    Esophageal cancer Neg Hx    Rectal cancer Neg Hx    Stomach cancer Neg Hx    Breast cancer Neg Hx    Social History:  reports that she has never smoked. She has never used smokeless tobacco. She reports that she  does not drink alcohol and does not use drugs.  Allergies:  Allergies  Allergen Reactions   Food Anaphylaxis    Peanuts; Almonds   Other Shortness Of Breath    Peanuts; Almonds   Wheat Bran Shortness Of Breath   Statins Itching and Other (See Comments)    Generalized aches   Iodine Other (See Comments)    The patient denied this allergy to me on 03/16/2021   Shellfish Allergy Other (See Comments)    Mouth gets raw   Sitagliptin     Other reaction(s): Unknown   Tetracycline Other (See Comments)    Raw mouth   Tetracyclines & Related Itching   Contrast Media [Iodinated Diagnostic Agents] Rash    Happened during CT scan over 30 years ago    Medications: Scheduled:  arformoterol  15 mcg Nebulization BID   aspirin EC  81 mg Oral Daily   budesonide (PULMICORT) nebulizer solution  0.5 mg Nebulization BID   carvedilol  12.5 mg Oral BID WC   Chlorhexidine Gluconate Cloth  6 each Topical Q0600   clopidogrel  75 mg Oral Daily   [START ON 05/07/2021] darbepoetin (ARANESP) injection - DIALYSIS  100 mcg Intravenous Q Fri-HD   diclofenac Sodium  2 g Topical QID   fluticasone  2 spray Each Nare Daily   furosemide  40 mg Oral Q M,W,F   heparin sodium (porcine)       hydrocortisone  25 mg Rectal BID   insulin glargine  13 Units Subcutaneous BID   ketotifen  2 drop Both Eyes BID   lidocaine  2 patch Transdermal Q24H   losartan  25 mg Oral QPM   melatonin  3 mg Oral QHS   ondansetron  4 mg Oral Q M,W,F-HD   pantoprazole  40 mg Oral BID   rosuvastatin  20 mg Oral Daily   senna-docusate  2 tablet Oral BID   sevelamer carbonate  0.8 g Oral TID WC   Continuous:  sodium chloride 10 mL/hr at 04/30/21 1249    ROS: Unable to obtain due to confusion.    Physical Examination: Blood pressure (!) 151/62, pulse 83, temperature 98.1 F (36.7 C), temperature source Oral, resp. rate (!) 28, height 5\' 2"  (1.575 m), weight 78.8 kg, SpO2 100 %.  HEENT: [place]/AT Lungs: Respirations unlabored Ext:  Warm and well perfused  Neurologic Examination: Mental Status: Awake with left hemineglect on initial exam, with hemineglect improving after CT. Speech slow and dysarthric. In the context of sparse verbal output, speech is fluent with no errors of grammar or syntax. Follows all basic commands but  with significantly more difficulty on the left after she improved after CT from an initial state of severe left sided weakness.  Cranial Nerves: II:  Left visual field cut. Slightly asymmetric pupils but round and equally reactive.  III,IV, VI: No ptosis. Rightward gaze preference; unable to volitionally cross to the left, but can be overcome with oculocephalic maneuver.  V,VII: Left facial droop. Decreased responsiveness to left facial stimulation but states cold sensation is equal bilaterally.  VIII: Hearing intact to voice IX,X: Mildly hypophonic speech XI: Decreased shoulder movement on the left.  XII: Midline tongue extension Motor: RUE 4/5 with decreased effort proximally and distally RLE able to elevate antigravity and resist examiner in the context of easy fatiguability LUE initially with severe weakness; after CT was able to lift antigravity and resist examiner with 4-/5 strength.  LLE after CT unable to elevate antigravity, with 2/5 strength proximally and distally in the context of easy fatigueability and decreased effort.  Sensory: Decreased sensation to LUE and LLE. Positive for extinction on the left.  Deep Tendon Reflexes:  Hypoactive reflexes throughout.  Plantars: Right: Downgoing   Left: Upgoing Cerebellar: No ataxia with FNF on the right. [place] on the left is not disproportionate to the degree of LUE weakness.  Gait: Unable to assess  Results for orders placed or performed during the hospital encounter of 04/20/21 (from the past 48 hour(s))  Glucose, capillary     Status: Abnormal   Collection Time: 05/03/21  9:00 PM  Result Value Ref Range   Glucose-Capillary 263 (H) 70 -  99 mg/dL    Comment: Glucose reference range applies only to samples taken after fasting for at least 8 hours.  Glucose, capillary     Status: Abnormal   Collection Time: 05/04/21  5:58 AM  Result Value Ref Range   Glucose-Capillary 117 (H) 70 - 99 mg/dL    Comment: Glucose reference range applies only to samples taken after fasting for at least 8 hours.  Glucose, capillary     Status: Abnormal   Collection Time: 05/04/21 11:19 AM  Result Value Ref Range   Glucose-Capillary 178 (H) 70 - 99 mg/dL    Comment: Glucose reference range applies only to samples taken after fasting for at least 8 hours.  Glucose, capillary     Status: Abnormal   Collection Time: 05/04/21  4:14 PM  Result Value Ref Range   Glucose-Capillary 188 (H) 70 - 99 mg/dL    Comment: Glucose reference range applies only to samples taken after fasting for at least 8 hours.  Glucose, capillary     Status: Abnormal   Collection Time: 05/04/21  9:08 PM  Result Value Ref Range   Glucose-Capillary 145 (H) 70 - 99 mg/dL    Comment: Glucose reference range applies only to samples taken after fasting for at least 8 hours.  Glucose, capillary     Status: None   Collection Time: 05/05/21  6:20 AM  Result Value Ref Range   Glucose-Capillary 73 70 - 99 mg/dL    Comment: Glucose reference range applies only to samples taken after fasting for at least 8 hours.  Renal function panel     Status: Abnormal   Collection Time: 05/05/21 11:31 AM  Result Value Ref Range   Sodium 132 (L) 135 - 145 mmol/L   Potassium 4.2 3.5 - 5.1 mmol/L   Chloride 94 (L) 98 - 111 mmol/L   CO2 30 22 - 32 mmol/L   Glucose, Bld 123 (H) 70 -  99 mg/dL    Comment: Glucose reference range applies only to samples taken after fasting for at least 8 hours.   BUN 14 8 - 23 mg/dL   Creatinine, Ser 5.78 (H) 0.44 - 1.00 mg/dL   Calcium 9.5 8.9 - 10.3 mg/dL   Phosphorus 5.1 (H) 2.5 - 4.6 mg/dL   Albumin 3.3 (L) 3.5 - 5.0 g/dL   GFR, Estimated 7 (L) >60 mL/min     Comment: (NOTE) Calculated using the CKD-EPI Creatinine Equation (2021)    Anion gap 8 5 - 15    Comment: Performed at [facility] [address], [place], [name] [address]  CBC     Status: Abnormal   Collection Time: 05/05/21 11:31 AM  Result Value Ref Range   WBC 10.1 4.0 - 10.5 K/uL   RBC 3.80 (L) 3.87 - 5.11 MIL/uL   Hemoglobin 11.1 (L) 12.0 - 15.0 g/dL   HCT 33.2 (L) 36.0 - 46.0 %   MCV 87.4 80.0 - 100.0 fL   MCH 29.2 26.0 - 34.0 pg   MCHC 33.4 30.0 - 36.0 g/dL   RDW 15.7 (H) 11.5 - 15.5 %   Platelets 391 150 - 400 K/uL   nRBC 0.0 0.0 - 0.2 %    Comment: Performed at [facility], [place] [address], [place], [name] [address]  Glucose, capillary     Status: Abnormal   Collection Time: 05/05/21  4:48 PM  Result Value Ref Range   Glucose-Capillary 129 (H) 70 - 99 mg/dL    Comment: Glucose reference range applies only to samples taken after fasting for at least 8 hours.  Glucose, capillary     Status: Abnormal   Collection Time: 05/05/21  6:41 PM  Result Value Ref Range   Glucose-Capillary 152 (H) 70 - 99 mg/dL    Comment: Glucose reference range applies only to samples taken after fasting for at least 8 hours.   DG Chest Port 1 View  Result Date: 05/05/2021 CLINICAL DATA:  Shortness of breath EXAM: PORTABLE CHEST 1 VIEW COMPARISON:  05/03/2021 FINDINGS: Cardiac shadow is mildly enlarged but stable. Right jugular central line is again seen deep in the right atrium but stable. Lungs are well aerated bilaterally with patchy stable atelectasis/scarring on the left. No acute abnormality is seen. IMPRESSION: No acute abnormality noted. Stable changes in the left base are seen. Electronically Signed   By: Inez Catalina M.D.   On: 05/05/2021 19:08    MRI brain 6/24:  1. 2.1 cm acute ischemic nonhemorrhagic right basal ganglia infarct. 2. Underlying moderate chronic microvascular ischemic disease.  MRA head and neck 6/25: 1. Technically limited exam due to extensive motion  artifact. 2. Grossly negative MRA of the head and neck. No large vessel occlusion. No proximal high-grade or correctable stenosis.  Assessment: 70 y.o. female in rehab for right subcortical stroke apporoximately 4 weeks ago, now with acute onset of left hemineglect, left sided weakness and rightward gaze deviation, left facial droop, left visual field cut 1. CT head negative for ICH. Age-related atrophy and chronic microvascular ischemic changes. Similar appearance of focal area of old infarct lateral to the right basal ganglia 2. CTA of head and neck: Negative CTA for emergent large vessel occlusion. Mild atheromatous change about the major arterial vasculature of the head and neck. No proximal high-grade or correctable stenosis. 2 mm focal outpouching arising from the proximal cavernous right ICA, which could reflect a small aneurysm versus vascular infundibulum. 3. CT  perfusion portion of this exam unfortunately failed due to technical difficulty. No perfusion maps were generated. 4. Most likely etiology for patient's acute neurological worsening is watershed TIA/infarction of the right cerebral hemisphere.  5. Not a tPA candidate due to recent stroke.  6. Not an endovascular candidate due to no LVO on CTA 7. Stroke Risk Factors - As per HPI.   Recommendations: 1. Will need to be readmitted to the medical floor from Rehab for further care given recurrence of acute stroke.  2. Repeat MRI brain.  3. Gentle IVF to maintain SBP > 120 4. PT consult, OT consult, Speech consult 5. Cardiac telemetry 6. Continue ASA and Plavix.  7. Continue statin 8. Most likely will need to use more gentle parameters with future dialysis sessions 9. Frequent neuro checks 10. Discussed with Hospitalist.  11. Also discussed with patient's husband, who expressed understanding and agreement with the current plan.    @Electronically  signed: Dr. Kerney Elbe  05/05/2021, 7:53 PM

## 2021-05-05 NOTE — Progress Notes (Signed)
Occupational Therapy Weekly Progress Note  Patient Details  Name: Carolyn Russo MRN: [id number] Date of Birth: Dec 26, 1950  Beginning of progress report period: April 27, 2021 End of progress report period: June 05, 2021   Patient has met 2 of 13 long term goals.  No short term goals set due to estimated LOS. Pt is progressing slowly towards LTGs, sessions continue to be limited by n/v and fatigue.  Patient continues to demonstrate the following deficits: muscle weakness, decreased cardiorespiratoy endurance, decreased coordination, decreased motor planning, and decreased balance strategies and therefore will continue to benefit from skilled OT intervention to enhance overall performance with BADL.  Patient progressing toward long term goals..  Continue plan of care.  OT Short Term Goals Week 3:  OT Short Term Goal 1 (Week 3): STGs=LTGS 2/2 ELOS (pt was extended)   Therapy/Group: Individual Therapy  Lou Loewe 05/05/2021, 10:26 AM

## 2021-05-05 NOTE — Progress Notes (Signed)
Pt is back to unit. PA was made aware. Pa will be calling hosptalist to eval. patient.

## 2021-05-05 NOTE — Significant Event (Signed)
Rapid Response Event Note   Reason for Call :  Disoriented post hemodialysis  Initial Focused Assessment:  Patient is lying in the bed.  She is alert but disoriented.  She is oriented to self and able to state month and age correctly but she states she is in [place].  She describes fullness of her head and mild nausea and abdominal pain.  Per staff this is a frequent complaint.  Lung sounds are clear, heart tones regular.  She has mild increased WOB and RR upper 20s.   Mild peripheral edema.  Right arm AV graft site tender.    Pam Love at bedside to assess patient  NIHSS: 12   LOC - 0   Ouestions - 0   Commands - 0   Gaze preference right - 1   Visual field cut left - 2   Facial droop left - 1   Right arm - 0   Left arm - 2   Right arm - 0   Left arm - 2   Ataxia - 0   Sensory - 1   Aphasia - 1  Dysarthria 0  Neglect - 2    Prior documented NIHSS 4,  per staff and husband she was not neglecting her left side this morning.  LKW at noon.    Interventions:  PCXR done Code Stroke called at 1924   Dr Cheral Marker at bedside to assess patient IV team at bedside placed 18 ga PIV Left AC CT head done, CTA/CTP done  Plan of Care:     Event Summary:   MD Notified: Algis Liming PA Call Time: [place] Time: 1803 End Time: 2040  Raliegh Ip, RN

## 2021-05-06 ENCOUNTER — Observation Stay (HOSPITAL_COMMUNITY)
Admission: RE | Admit: 2021-05-06 | Discharge: 2021-05-07 | Disposition: A | Discharge status: Home / Self Care | Payer: Medicare Other | Source: Ambulatory Visit | Attending: Student | Admitting: Student

## 2021-05-06 ENCOUNTER — Observation Stay (HOSPITAL_COMMUNITY): Payer: Medicare Other

## 2021-05-06 DIAGNOSIS — E118 Type 2 diabetes mellitus with unspecified complications: Secondary | ICD-10-CM | POA: Diagnosis not present

## 2021-05-06 DIAGNOSIS — J45909 Unspecified asthma, uncomplicated: Secondary | ICD-10-CM | POA: Insufficient documentation

## 2021-05-06 DIAGNOSIS — R569 Unspecified convulsions: Secondary | ICD-10-CM

## 2021-05-06 DIAGNOSIS — I5032 Chronic diastolic (congestive) heart failure: Secondary | ICD-10-CM | POA: Insufficient documentation

## 2021-05-06 DIAGNOSIS — N186 End stage renal disease: Secondary | ICD-10-CM | POA: Diagnosis not present

## 2021-05-06 DIAGNOSIS — I2510 Atherosclerotic heart disease of native coronary artery without angina pectoris: Secondary | ICD-10-CM | POA: Diagnosis not present

## 2021-05-06 DIAGNOSIS — Z794 Long term (current) use of insulin: Secondary | ICD-10-CM

## 2021-05-06 DIAGNOSIS — E1165 Type 2 diabetes mellitus with hyperglycemia: Secondary | ICD-10-CM | POA: Insufficient documentation

## 2021-05-06 DIAGNOSIS — R4182 Altered mental status, unspecified: Principal | ICD-10-CM | POA: Insufficient documentation

## 2021-05-06 DIAGNOSIS — R2990 Unspecified symptoms and signs involving the nervous system: Secondary | ICD-10-CM | POA: Diagnosis not present

## 2021-05-06 DIAGNOSIS — R531 Weakness: Secondary | ICD-10-CM | POA: Diagnosis not present

## 2021-05-06 DIAGNOSIS — Z8719 Personal history of other diseases of the digestive system: Secondary | ICD-10-CM

## 2021-05-06 DIAGNOSIS — Z79899 Other long term (current) drug therapy: Secondary | ICD-10-CM | POA: Insufficient documentation

## 2021-05-06 DIAGNOSIS — E1122 Type 2 diabetes mellitus with diabetic chronic kidney disease: Secondary | ICD-10-CM | POA: Insufficient documentation

## 2021-05-06 DIAGNOSIS — I10 Essential (primary) hypertension: Secondary | ICD-10-CM

## 2021-05-06 DIAGNOSIS — R471 Dysarthria and anarthria: Secondary | ICD-10-CM | POA: Insufficient documentation

## 2021-05-06 DIAGNOSIS — R41841 Cognitive communication deficit: Secondary | ICD-10-CM | POA: Insufficient documentation

## 2021-05-06 DIAGNOSIS — N1832 Chronic kidney disease, stage 3b: Secondary | ICD-10-CM

## 2021-05-06 DIAGNOSIS — R29898 Other symptoms and signs involving the musculoskeletal system: Secondary | ICD-10-CM | POA: Diagnosis not present

## 2021-05-06 DIAGNOSIS — Z8673 Personal history of transient ischemic attack (TIA), and cerebral infarction without residual deficits: Secondary | ICD-10-CM | POA: Diagnosis not present

## 2021-05-06 LAB — GLUCOSE, CAPILLARY
Glucose-Capillary: 113 mg/dL — ABNORMAL HIGH (ref 70–99)
Glucose-Capillary: 82 mg/dL (ref 70–99)
Glucose-Capillary: 72 mg/dL (ref 70–99)
Glucose-Capillary: 82 mg/dL (ref 70–99)
Glucose-Capillary: 221 mg/dL — ABNORMAL HIGH (ref 70–99)
Glucose-Capillary: 291 mg/dL — ABNORMAL HIGH (ref 70–99)

## 2021-05-06 LAB — CBC
HCT: 31 % — ABNORMAL LOW (ref 36.0–46.0)
Hemoglobin: 10.6 g/dL — ABNORMAL LOW (ref 12.0–15.0)
MCH: 30.3 pg (ref 26.0–34.0)
MCHC: 34.2 g/dL (ref 30.0–36.0)
MCV: 88.6 fL (ref 80.0–100.0)
Platelets: 235 10*3/uL (ref 150–400)
RBC: 3.5 MIL/uL — ABNORMAL LOW (ref 3.87–5.11)
RDW: 15.9 % — ABNORMAL HIGH (ref 11.5–15.5)
WBC: 8.8 10*3/uL (ref 4.0–10.5)
nRBC: 0 % (ref 0.0–0.2)

## 2021-05-06 IMAGING — MR MR HEAD W/O CM
8 of 10 series · 38 of 48 positions shown · non-contrast
Comparison: [DATE]

CLINICAL DATA: Left-sided weakness

EXAM:
MRI HEAD WITHOUT CONTRAST
TECHNIQUE: Multiplanar, multiecho pulse sequences of the brain and surrounding
structures were obtained without intravenous contrast.

[Series 3: DWI · axial · 3.0mm · 1.09mm/px · z∈[-59,+84]mm · 9 of 98 slices shown (1 of 4)]
[im 1/98]
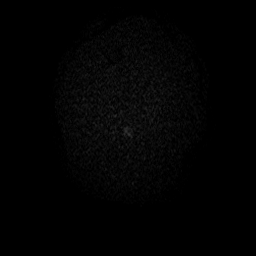
[im 17/98]
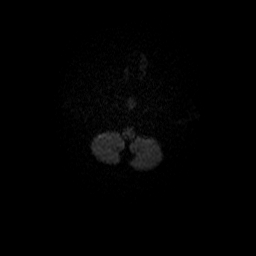
[im 33/98]
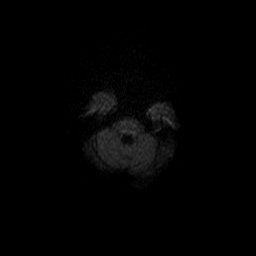
[im 41/98]
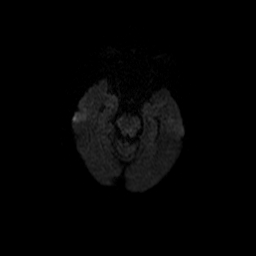
[im 49/98]
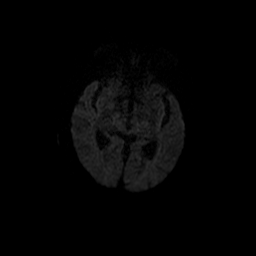
[im 57/98]
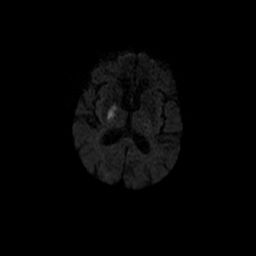
[im 65/98]
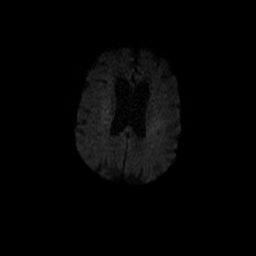
[im 81/98]
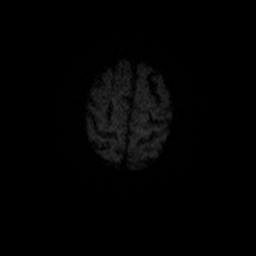
[im 98/98]
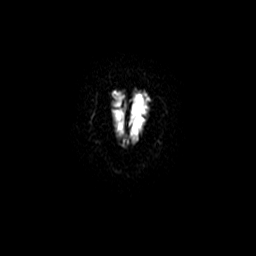

[Series 4: DWI · coronal · 5.0mm · 1.09mm/px · 8 of 66 slices shown (2 of 4)]
[im 1/66]
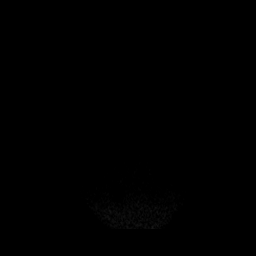
[im 10/66]
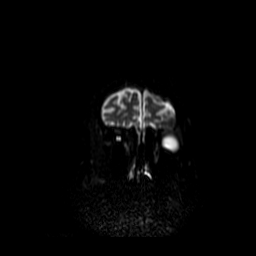
[im 19/66]
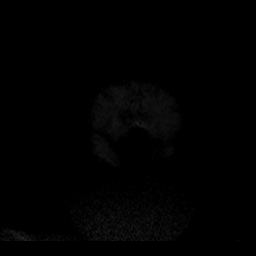
[im 28/66]
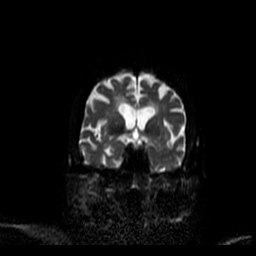
[im 38/66]
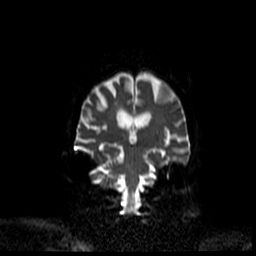
[im 47/66]
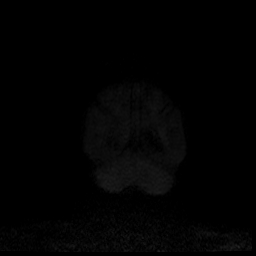
[im 56/66]
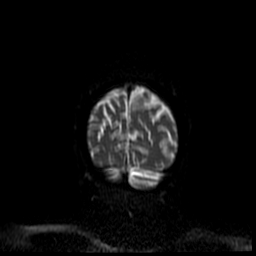
[im 66/66]
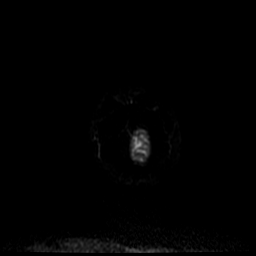

[Series 5: FLAIR · axial · 5.0mm · 0.43mm/px · z∈[-54,+83]mm · 3 of 24 slices shown]
[im 1/24]
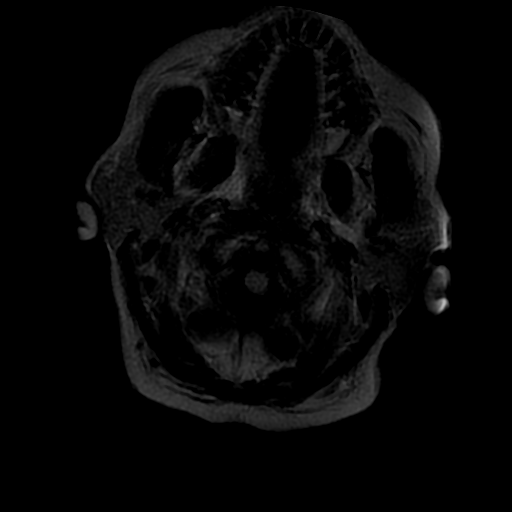
[im 12/24]
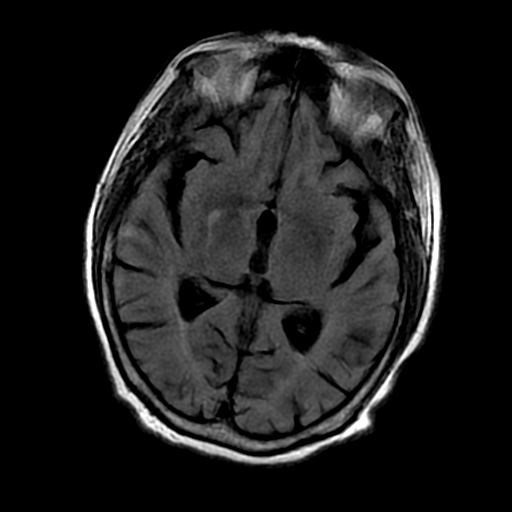
[im 24/24]
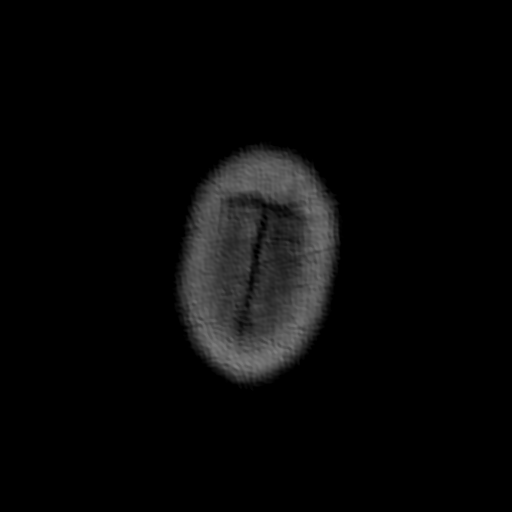

[Series 7: T2 · axial · 5.0mm · 0.43mm/px · z∈[-51,+86]mm · 3 of 24 slices shown (1 of 2)]
[im 1/24]
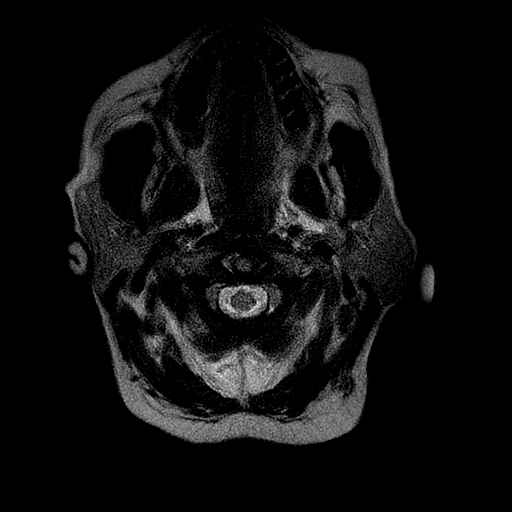
[im 12/24]
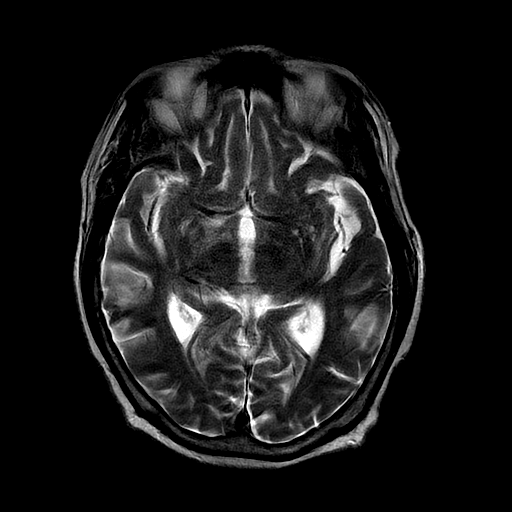
[im 24/24]
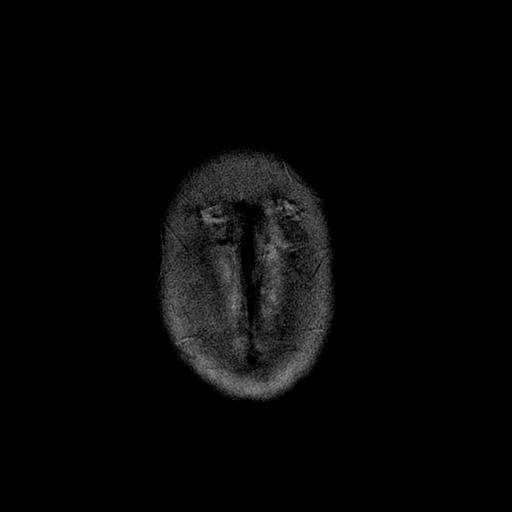

[Series 8: T1 · axial · 5.0mm · 0.47mm/px · z∈[-60,+10]mm · 2 of 22 slices shown]
[im 1/22]
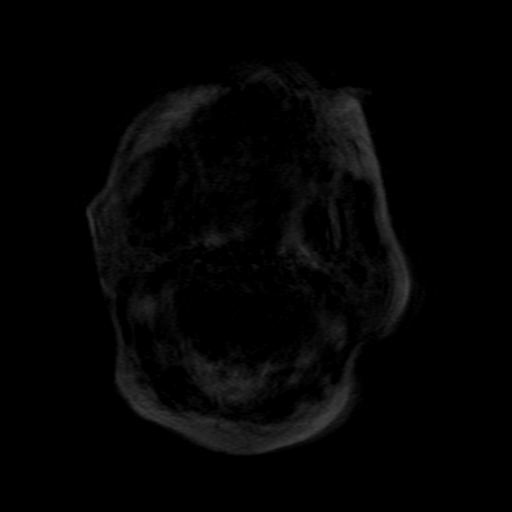
[im 11/22]
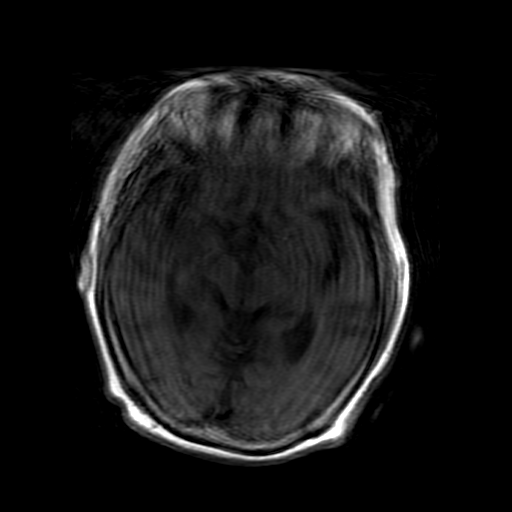

[Series 10: T2 · coronal · 5.0mm · 0.43mm/px · 3 of 26 slices shown (2 of 2)]
[im 1/26]
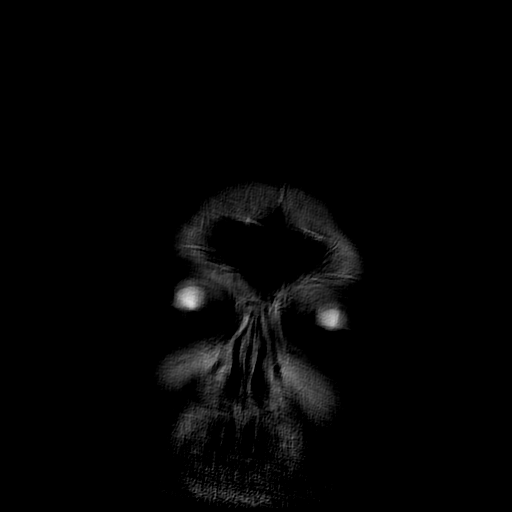
[im 13/26]
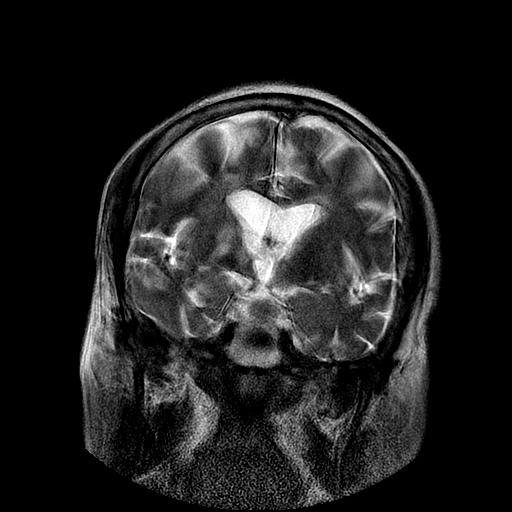
[im 26/26]
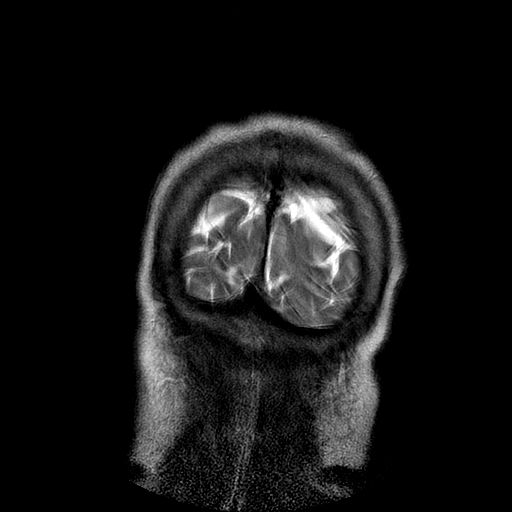

[Series 300: DWI · axial · 3.0mm · 1.09mm/px · z∈[-59,+84]mm · 6 of 49 slices shown (3 of 4)]
[im 1/49]
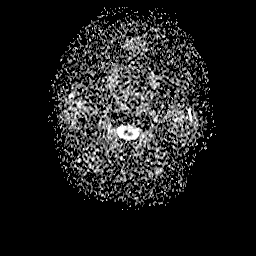
[im 10/49]
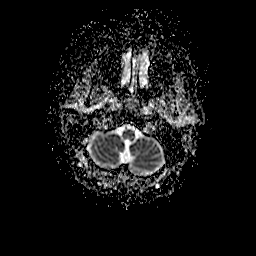
[im 20/49]
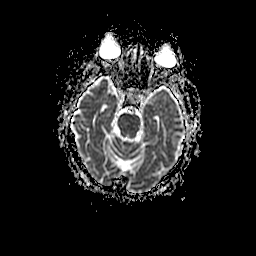
[im 29/49]
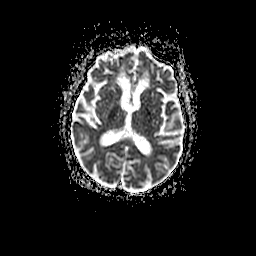
[im 39/49]
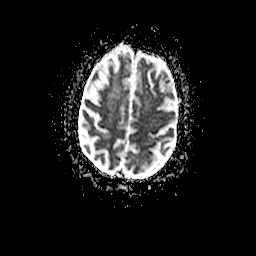
[im 49/49]
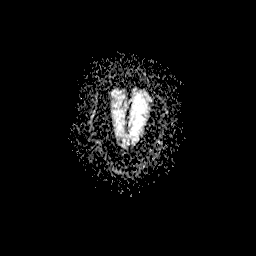

[Series 400: DWI · coronal · 5.0mm · 1.09mm/px · 4 of 33 slices shown (4 of 4)]
[im 1/33]
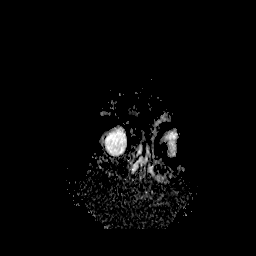
[im 11/33]
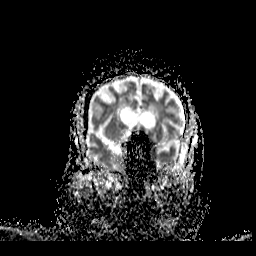
[im 22/33]
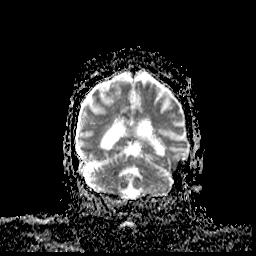
[im 33/33]
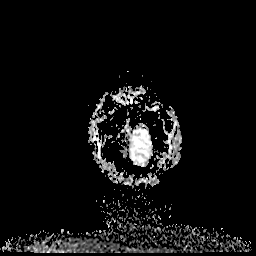

[38 of 48 positions shown; findings below may reference images not displayed]

FINDINGS: Significant motion artifact is present.

Brain: There is persistent diffusion hyperintensity with decreased
ADC hypointensity involving the right corona radiata and basal
ganglia. No new abnormal diffusion.

There is no acute infarction or intracranial hemorrhage. There is no
intracranial mass, mass effect, or edema. There is no hydrocephalus
or extra-axial fluid collection. Prominence of the ventricles and
sulci reflects stable parenchymal volume loss. Patchy and confluent
areas of T2 hyperintensity in the supratentorial and pontine white
matter are nonspecific but probably reflects stable chronic
microvascular ischemic changes. Very small chronic right cerebellar
infarct is noted.

Vascular: Major vessel flow voids at the skull base are preserved.

Skull and upper cervical spine: Normal marrow signal is grossly
preserved.

Sinuses/Orbits: Paranasal sinuses are aerated. Bilateral lens
replacements.

Other: Sella is unremarkable.  Mastoid air cells are clear.
IMPRESSION: No acute infarction. Evolving prior infarction of the right corona
radiata and basal ganglia.

Chronic microvascular ischemic changes.

## 2021-05-06 MED ORDER — HYDROCORTISONE (PERIANAL) 2.5 % EX CREA: 1.0000 "application " | TOPICAL_CREAM | Freq: Two times a day (BID) | CUTANEOUS | Status: DC | PRN

## 2021-05-06 MED ORDER — STROKE: EARLY STAGES OF RECOVERY BOOK
Freq: Once | Status: AC
  Filled 2021-05-06: qty 1

## 2021-05-06 MED ORDER — ACETAMINOPHEN 325 MG PO TABS
650.0000 mg | ORAL_TABLET | ORAL | Status: DC | PRN
  Filled 2021-05-06: qty 2

## 2021-05-06 MED ORDER — LEVETIRACETAM 500 MG PO TABS
500.0000 mg | ORAL_TABLET | Freq: Every day | ORAL | Status: DC
  Administered 2021-05-06: 500 mg via ORAL
  Filled 2021-05-06: qty 1

## 2021-05-06 MED ORDER — ROSUVASTATIN CALCIUM 20 MG PO TABS
20.0000 mg | ORAL_TABLET | Freq: Every day | ORAL | Status: DC
  Administered 2021-05-06: 20 mg via ORAL
  Filled 2021-05-06 (×2): qty 1

## 2021-05-06 MED ORDER — SENNOSIDES-DOCUSATE SODIUM 8.6-50 MG PO TABS: 1.0000 | ORAL_TABLET | Freq: Every evening | ORAL | Status: DC | PRN

## 2021-05-06 MED ORDER — MOMETASONE FURO-FORMOTEROL FUM 200-5 MCG/ACT IN AERO
2.0000 | INHALATION_SPRAY | Freq: Two times a day (BID) | RESPIRATORY_TRACT | Status: DC
  Administered 2021-05-06 – 2021-05-07 (×3): 2 via RESPIRATORY_TRACT
  Filled 2021-05-06: qty 8.8

## 2021-05-06 MED ORDER — CARVEDILOL 3.125 MG PO TABS: 3.1250 mg | ORAL_TABLET | Freq: Two times a day (BID) | ORAL | Status: DC

## 2021-05-06 MED ORDER — HEPARIN SODIUM (PORCINE) 5000 UNIT/ML IJ SOLN
5000.0000 [IU] | Freq: Three times a day (TID) | INTRAMUSCULAR | Status: DC
  Administered 2021-05-06 – 2021-05-07 (×4): 5000 [IU] via SUBCUTANEOUS
  Filled 2021-05-06 (×4): qty 1

## 2021-05-06 MED ORDER — INSULIN ASPART 100 UNIT/ML IJ SOLN
0.0000 [IU] | Freq: Every day | INTRAMUSCULAR | Status: DC
  Administered 2021-05-06: 3 [IU] via SUBCUTANEOUS

## 2021-05-06 MED ORDER — ASPIRIN EC 81 MG PO TBEC
81.0000 mg | DELAYED_RELEASE_TABLET | Freq: Every day | ORAL | Status: DC
  Administered 2021-05-06: 81 mg via ORAL
  Filled 2021-05-06 (×2): qty 1

## 2021-05-06 MED ORDER — CLOPIDOGREL BISULFATE 75 MG PO TABS
75.0000 mg | ORAL_TABLET | Freq: Every day | ORAL | Status: DC
  Administered 2021-05-06: 75 mg via ORAL
  Filled 2021-05-06 (×2): qty 1

## 2021-05-06 MED ORDER — ACETAMINOPHEN 160 MG/5ML PO SOLN: 650.0000 mg | ORAL | Status: DC | PRN

## 2021-05-06 MED ORDER — B COMPLEX-C PO TABS
1.0000 | ORAL_TABLET | Freq: Every day | ORAL | Status: DC
  Administered 2021-05-06: 1 via ORAL
  Filled 2021-05-06 (×2): qty 1

## 2021-05-06 MED ORDER — CHLORHEXIDINE GLUCONATE CLOTH 2 % EX PADS
6.0000 | MEDICATED_PAD | Freq: Every day | CUTANEOUS | Status: DC
  Administered 2021-05-07: 6 via TOPICAL

## 2021-05-06 MED ORDER — ACETAMINOPHEN 650 MG RE SUPP: 650.0000 mg | RECTAL | Status: DC | PRN

## 2021-05-06 MED ORDER — INSULIN ASPART 100 UNIT/ML IJ SOLN
0.0000 [IU] | Freq: Three times a day (TID) | INTRAMUSCULAR | Status: DC
  Administered 2021-05-06: 2 [IU] via SUBCUTANEOUS
  Administered 2021-05-07 (×2): 1 [IU] via SUBCUTANEOUS

## 2021-05-06 MED ORDER — SEVELAMER CARBONATE 800 MG PO TABS
800.0000 mg | ORAL_TABLET | Freq: Three times a day (TID) | ORAL | Status: DC
  Administered 2021-05-06 – 2021-05-07 (×3): 800 mg via ORAL
  Filled 2021-05-06 (×3): qty 1

## 2021-05-06 MED ORDER — PANTOPRAZOLE SODIUM 40 MG PO TBEC
40.0000 mg | DELAYED_RELEASE_TABLET | Freq: Every day | ORAL | Status: DC
  Administered 2021-05-06: 40 mg via ORAL
  Filled 2021-05-06 (×2): qty 1

## 2021-05-06 MED ORDER — ALBUTEROL SULFATE (2.5 MG/3ML) 0.083% IN NEBU: 2.5000 mg | INHALATION_SOLUTION | RESPIRATORY_TRACT | Status: DC | PRN

## 2021-05-06 MED ORDER — VITAMIN D 25 MCG (1000 UNIT) PO TABS
2000.0000 [IU] | ORAL_TABLET | Freq: Every day | ORAL | Status: DC
  Administered 2021-05-06: 2000 [IU] via ORAL
  Filled 2021-05-06 (×2): qty 2

## 2021-05-06 MED ORDER — ONDANSETRON HCL 4 MG PO TABS: 4.0000 mg | ORAL_TABLET | Freq: Four times a day (QID) | ORAL | Status: DC | PRN

## 2021-05-06 NOTE — Procedures (Signed)
Patient Name: Carolyn Russo  MRN: [id number]  Epilepsy Attending: [name]  Referring Physician/Provider: Elmyra Ricks, NP Date: 05/06/2021 Duration: 27.06 mins  Patient history: 70yo F with recent stroke presented with worsening of previous stroke symptoms during HD. EEG to evaluate for seizure.   Level of alertness: Awake  AEDs during EEG study: None  Technical aspects: This EEG study was done with scalp electrodes positioned according to the 10-20 International system of electrode placement. Electrical activity was acquired at a sampling rate of 500Hz  and reviewed with a high frequency filter of 70Hz  and a low frequency filter of 1Hz . EEG data were recorded continuously and digitally stored.   Description: The posterior dominant rhythm consists of 9 Hz activity of moderate voltage (25-35 uV) seen predominantly in posterior head regions, symmetric and reactive to eye opening and eye closing. EEG showed continuous sharply contoured 2-3Hz  delta slowing in right hemisphere as well as intermittent 3-6hz  theta-delta slowing. Spikes were also noted in right centro-parietal region. Hyperventilation and photic stimulation were not performed.     ABNORMALITY  -Spike, right centro-parietal region.  - Continuous slow, right hemisphere - Intermittent slow, generalized  IMPRESSION: This study showed evidence of potential epileptogenicity arising from right centro-parietal region. There is also cortical dysfunction arising from right hemisphere likely secondary to underlying stroke. Additionally, there is mild diffuse encephalopathy, nonspecific etiology. No seizures were seen throughout the recording.   Carolyn Russo

## 2021-05-06 NOTE — Progress Notes (Signed)
Called bed placement to confirm request of bed on med surg telemetry unit. No bed available at this time and will probably not be available until later on this morning. Pam Love Notified.

## 2021-05-06 NOTE — H&P (Signed)
History and Physical    Carolyn Russo BPZ:[id number] DOB: 1951-08-28 DOA: 05/06/2021  PCP: [name], MD Patient coming from: CIR  Chief Complaint: "Left-sided weakness" and altered mental status  HPI: Carolyn Russo is a 70 y.o. female with PMH of ESRD on HD MWF, lower GIB, DM-2 HTN, HLD, diastolic CHF and recent hospitalization from 6/24-6/28 for acute CVA with left-sided weakness and ABLA due to lower GIB returning from CIR earlier this morning with "left-sided weakness and altered mental status.  The plan was to start aspirin in about a week in the setting of GI bleed.  Apparently, rapid response called due to confusion and restlessness after dialysis on 7/13.  Code stroke activated.  Neurology evaluated patient and recommended admission to medical floor and continuing aspirin, Plavix, Crestor and SBP > 120 out of concern for acute watershed CVA.  Patient had CT head, CTA head and neck without acute finding.  CT perfusion study failed due to technical difficulty.  On my evaluation, patient is oriented to self, place, month and year but not date.  She thinks she is still at rehab for stroke.  Patient reports pain on the left side of her chest.  She is holding her left arm close to her chest.  No notable abnormality on her left chest.  She has peripheral IV lines on left arm.  No signs of infection or skin rash.  Seems to have limited active range of motion in left arm, but passive range of motion seems to be intact.  She denies shortness of breath.  She denies headache or vision change.  She denies nausea, vomiting or abdominal pain.  She follows commands fairly.   ROS All review of system negative except for pertinent positives and negatives as history of present illness above.  PMH Past Medical History:  Diagnosis Date   Acute GI bleeding    Allergy    Anemia    Anterior chest wall pain    Appendicitis 1965   Asthma    Body mass index 37.0-37.9, adult    Breast pain     Cataract    both eyes   CHF (congestive heart failure) ([place])    Chronic kidney disease    stage 5 - on dialysis   Cognitive change 04/20/2021   r/t cva 03/2821   Dehydration 2014   Deviated septum 1971   Diabetes mellitus    no meds   Dyspnea 2014   Extrinsic asthma    WITH ASTHMA ATTACK   Fibroid 1980   GERD (gastroesophageal reflux disease)    Heart murmur    Hx gestational diabetes    Hyperlipidemia    Hypertension 2014   Inguinal hernia 1959   Malaise and fatigue 2014   Non-IgE mediated allergic asthma 2014   Obesity    Pelvic pain    Pregnancy, high-risk 1985   Stroke ([place]) 04/20/2021   (CVA) of right basal ganglia   Tonsillitis 1968   Uterine fibroid 1980   [place] Past Surgical History:  Procedure Laterality Date   APPENDECTOMY  2423   BASCILIC VEIN TRANSPOSITION Right 04/30/2021   Procedure: RIGHT FIRST STAGE [place];  Surgeon: Angelia Mould, MD;  Location: [facility] OR;  Service: Vascular;  Laterality: Right;   CESAREAN SECTION  1985   COLONOSCOPY     ESOPHAGOGASTRODUODENOSCOPY (EGD) WITH PROPOFOL N/A 04/18/2021   Procedure: ESOPHAGOGASTRODUODENOSCOPY (EGD) WITH PROPOFOL;  Surgeon: Gatha Mayer, MD;  Location: [facility] ENDOSCOPY;  Service: Endoscopy;  Laterality: N/A;   EYE SURGERY     bilateral cataract    FLEXIBLE SIGMOIDOSCOPY N/A 04/18/2021   Procedure: FLEXIBLE SIGMOIDOSCOPY;  Surgeon: Gatha Mayer, MD;  Location: [facility] ENDOSCOPY;  Service: Endoscopy;  Laterality: N/A;   HERNIA REPAIR  1959   IR FLUORO GUIDE CV LINE RIGHT  03/18/2021   IR US GUIDE VASC ACCESS RIGHT  03/18/2021   LEFT HEART CATH AND CORONARY ANGIOGRAPHY N/A 04/04/2017   Procedure: Left Heart Cath and Coronary Angiography;  Surgeon: Belva Crome, MD;  Location: [place] CV LAB;  Service: Cardiovascular;  Laterality: N/A;   MYOMECTOMY  1980, 2004, 2007   RHINOPLASTY  1971   ROTATOR CUFF REPAIR  2003   SURGICAL REPAIR OF HEMORRHAGE  2015   TONSILLECTOMY  1968   Fam  HX Family History  Problem Relation Age of Onset   Cancer Mother        abdominal melamona   Psoriasis Mother    Alzheimer's disease Father    Cancer Cousin        colon    Diabetes Cousin    Diabetes Maternal Aunt    Colon cancer Neg Hx    Colon polyps Neg Hx    Esophageal cancer Neg Hx    Rectal cancer Neg Hx    Stomach cancer Neg Hx    Breast cancer Neg Hx     Social Hx  reports that she has never smoked. She has never used smokeless tobacco. She reports that she does not drink alcohol and does not use drugs.  Allergy Allergies  Allergen Reactions   Food Anaphylaxis    Peanuts; Almonds   Other Shortness Of Breath    Peanuts; Almonds   Wheat Bran Shortness Of Breath   Statins Itching and Other (See Comments)    Generalized aches   Iodine Other (See Comments)    The patient denied this allergy to me on 03/16/2021   Shellfish Allergy Other (See Comments)    Mouth gets raw   Sitagliptin     Other reaction(s): Unknown   Tetracycline Other (See Comments)    Raw mouth   Tetracyclines & Related Itching   Contrast Media [Iodinated Diagnostic Agents] Rash    Happened during CT scan over 30 years ago   Home Meds Prior to Admission medications   Medication Sig Start Date End Date Taking? Authorizing Provider  albuterol (PROVENTIL) (2.5 MG/3ML) 0.083% nebulizer solution Take 3 mLs (2.5 mg total) by nebulization every 4 (four) hours as needed for wheezing or shortness of breath. 03/04/21 03/04/22  Bary Castilla, NP  albuterol (VENTOLIN HFA) 108 (90 Base) MCG/ACT inhaler Inhale 2 puffs into the lungs every 4 (four) hours as needed for wheezing or shortness of breath. 03/01/21   [name], MD  b complex vitamins capsule Take 1 capsule by mouth daily.    [provider]  BESIVANCE 0.6 % SUSP Place 1 drop into both eyes See admin instructions. After  eye injections 10/26/18   [provider]  [place] Oil-Levomenthol (FDGARD PO) Take 1 tablet by mouth 2 (two)  times daily as needed (nausea).    [provider]  carvedilol (COREG) 3.125 MG tablet Take 1 tablet (3.125 mg total) by mouth 2 (two) times daily with a meal. 03/24/21   Nita Sells, MD  Cholecalciferol (VITAMIN D3 PO) Take 2,000 Units by mouth daily.    [provider]  Continuous Blood Gluc Receiver (DEXCOM G6 RECEIVER) DEVI Use as directed to  check blood sugars 3 times per day dx: e11.65 04/14/20   [name], MD  Continuous Blood Gluc Sensor (DEXCOM G6 SENSOR) MISC Use as directed to check blood sugars 3 times per day dx: e11.65 04/14/20   [name], MD  Continuous Blood Gluc Transmit (DEXCOM G6 TRANSMITTER) MISC Use as directed to check blood sugars 3 times per day dx: e11.65 04/14/20   [name], MD  fluticasone [facility]) 50 MCG/ACT nasal spray Place 2 sprays into the nose daily as needed for allergies.     [provider]  furosemide (LASIX) 40 MG tablet Take 1-2 tablets (40-80 mg total) by mouth every Monday, Wednesday, and Friday. 80 mg twice a week. 40 mg all other days 03/24/21   Nita Sells, MD  hydrocortisone (ANUSOL-HC) 2.5 % rectal cream Place 1 application rectally 2 (two) times daily as needed for hemorrhoids or anal itching. 04/20/21   Ghimire, Henreitta Leber, MD  magnesium oxide (MAG-OX) 400 (241.3 Mg) MG tablet Take 400 mg by mouth daily.    [provider]  ondansetron (ZOFRAN) 4 MG tablet Take 1 tablet (4 mg total) by mouth every 6 (six) hours as needed for nausea or vomiting. 03/29/21 03/29/22  [name], MD  OneTouch Delica Lancets 78L MISC Use as directed to check blood sugars before breakfast and dinner dx: e11.65 04/22/20   [name], MD  pantoprazole (PROTONIX) 40 MG tablet Take 1 tablet (40 mg total) by mouth daily. 04/20/21   Ghimire, Henreitta Leber, MD  rosuvastatin (CRESTOR) 20 MG tablet Take 1 tablet (20 mg total) by mouth daily. 11/19/20   Belva Crome, MD  sevelamer carbonate (RENVELA) 800 MG tablet Take 1 tablet  (800 mg total) by mouth 3 (three) times daily with meals. 03/24/21   Nita Sells, MD  SYMBICORT 160-4.5 MCG/ACT inhaler Inhale 2 puffs into the lungs 2 (two) times daily. 02/26/21   [name], MD  traMADol (ULTRAM) 50 MG tablet Take 1 tablet (50 mg total) by mouth every 12 (twelve) hours as needed for severe pain. 03/24/21   Charlynne Cousins, MD  vitamin C (ASCORBIC ACID) 500 MG tablet Take 500 mg by mouth daily.    [provider]  iron polysaccharides (NIFEREX) 150 MG capsule Take 1 capsule (150 mg total) by mouth daily. Patient not taking: No sig reported 03/24/21 04/16/21  Nita Sells, MD  mometasone-formoterol (DULERA) 200-5 MCG/ACT AERO Inhale 2 puffs into the lungs 2 (two) times daily. 02/25/21 04/16/21  [name], MD    Physical Exam: Vitals:   05/06/21 0650  BP: (!) 146/70  Pulse: 72  Resp: 18  Temp: 98.6 F (37 C)  TempSrc: Oral    GENERAL: No acute distress.  Appears well.  HEENT: MMM.  Vision and hearing grossly intact.  NECK: Supple.  No apparent JVD.  RESP: On RA. No IWOB. Good air movement bilaterally. CVS:  RRR . Heart sounds normal.  ABD/GI/GU: Bowel sounds present. Soft. Non tender.  MSK/EXT:  Moves extremities. No apparent deformity or edema.  SKIN: no apparent skin lesion or wound NEURO: Awake and alert. Oriented to self, month and year.  Lt facial drop. PERRL. Seems to ignore left arm but left arm seems to be strong when engaged. Patellar reflex and finger to nose intact.  PSYCH: Calm. Normal affect.    Personally Reviewed Radiological Exams CT ANGIO HEAD W OR WO CONTRAST  Result Date: 05/05/2021 CLINICAL DATA:  Initial evaluation for acute stroke, TIA. EXAM: CT ANGIOGRAPHY HEAD AND  NECK CT PERFUSION BRAIN TECHNIQUE: Multidetector CT imaging of the head and neck was performed using the standard protocol during bolus administration of intravenous contrast. Multiplanar CT image reconstructions and MIPs were obtained to evaluate the  vascular anatomy. Carotid stenosis measurements (when applicable) are obtained utilizing NASCET criteria, using the distal internal carotid diameter as the denominator. Multiphase CT imaging of the brain was performed following IV bolus contrast injection. Subsequent parametric perfusion maps were calculated using RAPID software. CONTRAST:  174mL OMNIPAQUE IOHEXOL 350 MG/ML SOLN COMPARISON:  Prior head CT from earlier the same day as well as previous MRA from 04/17/2021. FINDINGS: CTA NECK FINDINGS Aortic arch: Visualized aortic arch normal in caliber with normal 3 vessel morphology. No hemodynamically significant stenosis seen about the origin of the great vessels. Right carotid system: Right common and internal carotid arteries patent without stenosis, dissection or occlusion. Minimal atheromatous plaque about the proximal cervical right ICA without stenosis. Left carotid system: Left common and internal carotid arteries patent without stenosis, dissection or occlusion. Vertebral arteries: Both vertebral arteries arise from subclavian arteries. No proximal subclavian artery stenosis. Vertebral arteries diminutive but widely patent within the neck without stenosis, dissection or occlusion. Skeleton: No visible acute osseous finding. No discrete or worrisome osseous lesions. Other neck: No other acute soft tissue abnormality within the neck. 7 mm right thyroid nodule noted, of doubtful significance given size and patient age, no follow-up imaging recommended (ref: J Am Coll Radiol. 2015 Feb;12(2): 143-50). Right-sided hemodialysis catheter in place. Upper chest: Visualized upper chest demonstrates no acute finding. Review of the MIP images confirms the above findings CTA HEAD FINDINGS Anterior circulation: Petrous segments patent bilaterally. Mild atheromatous change within the carotid siphons without hemodynamically significant stenosis. No made of a subtle 2 mm focal outpouching arising from the proximal cavernous  right ICA, which could reflect a small aneurysm versus vascular infundibulum (series 7, image 254). A1 segments patent bilaterally. Normal anterior communicating artery complex. Anterior cerebral arteries patent to their distal aspects without stenosis. No M1 stenosis or occlusion. Normal MCA bifurcations. Distal MCA branches well perfused and symmetric. Posterior circulation: Both V4 segments patent to the vertebrobasilar junction without stenosis. Both PICA origins patent and normal. Basilar patent to its distal aspect without stenosis. Superior cerebellar arteries patent bilaterally. Both PCA supplied via the basilar as well as prominent bilateral posterior communicating arteries. Atheromatous irregularity within the bilateral P2 segments with associated mild to moderate multifocal narrowing. PCAs remain patent to their distal aspects. Venous sinuses: Grossly patent allowing for timing the contrast bolus. Anatomic variants: None significant. Review of the MIP images confirms the above findings CT Brain Perfusion Findings: ASPECTS: Not calculated. The CT perfusion portion of this exam unfortunately failed due to technical difficulty. No perfusion maps were generated. IMPRESSION: 1. Negative CTA for emergent large vessel occlusion. 2. Mild atheromatous change about the major arterial vasculature of the head and neck as above. No proximal high-grade or correctable stenosis. 3. 2 mm focal outpouching arising from the proximal cavernous right ICA, which could reflect a small aneurysm versus vascular infundibulum. Attention at follow-up recommended. 4. CT perfusion portion of this exam unfortunately failed due to technical difficulty. No perfusion maps were generated. Critical Value/emergent results were called by telephone at the time of interpretation on 05/05/2021 at 8:15 pm to provider ERIC [facility], who verbally acknowledged these results. Electronically Signed   By: Jeannine Boga M.D.   On: 05/05/2021 21:04    CT HEAD WO CONTRAST  Result Date: 05/05/2021 CLINICAL DATA:  70 year old female with neurologic deficit. EXAM: CT HEAD WITHOUT CONTRAST TECHNIQUE: Contiguous axial images were obtained from the base of the skull through the vertex without intravenous contrast. COMPARISON:  Head CT dated 05/03/2021. FINDINGS: Brain: Mild age-related atrophy and moderate chronic microvascular ischemic changes. An area of infarct again noted lateral to the right thalamus, seen on the prior CT. There is no acute intracranial hemorrhage. No mass effect or midline shift. No extra-axial fluid collection. Vascular: No hyperdense vessel or unexpected calcification. Skull: Normal. Negative for fracture or focal lesion. Sinuses/Orbits: The visualized paranasal sinuses and mastoid air cells are clear. Bilateral maxillary sinus surgeries. Other: None IMPRESSION: 1. No acute intracranial pathology. 2. Age-related atrophy and chronic microvascular ischemic changes. Similar appearance of focal area of old infarct lateral to the right thalamus. Electronically Signed   By: Anner Crete M.D.   On: 05/05/2021 19:56   CT ANGIO NECK W OR WO CONTRAST  Result Date: 05/05/2021 CLINICAL DATA:  Initial evaluation for acute stroke, TIA. EXAM: CT ANGIOGRAPHY HEAD AND NECK CT PERFUSION BRAIN TECHNIQUE: Multidetector CT imaging of the head and neck was performed using the standard protocol during bolus administration of intravenous contrast. Multiplanar CT image reconstructions and MIPs were obtained to evaluate the vascular anatomy. Carotid stenosis measurements (when applicable) are obtained utilizing NASCET criteria, using the distal internal carotid diameter as the denominator. Multiphase CT imaging of the brain was performed following IV bolus contrast injection. Subsequent parametric perfusion maps were calculated using RAPID software. CONTRAST:  152mL OMNIPAQUE IOHEXOL 350 MG/ML SOLN COMPARISON:  Prior head CT from earlier the same day as  well as previous MRA from 04/17/2021. FINDINGS: CTA NECK FINDINGS Aortic arch: Visualized aortic arch normal in caliber with normal 3 vessel morphology. No hemodynamically significant stenosis seen about the origin of the great vessels. Right carotid system: Right common and internal carotid arteries patent without stenosis, dissection or occlusion. Minimal atheromatous plaque about the proximal cervical right ICA without stenosis. Left carotid system: Left common and internal carotid arteries patent without stenosis, dissection or occlusion. Vertebral arteries: Both vertebral arteries arise from subclavian arteries. No proximal subclavian artery stenosis. Vertebral arteries diminutive but widely patent within the neck without stenosis, dissection or occlusion. Skeleton: No visible acute osseous finding. No discrete or worrisome osseous lesions. Other neck: No other acute soft tissue abnormality within the neck. 7 mm right thyroid nodule noted, of doubtful significance given size and patient age, no follow-up imaging recommended (ref: J Am Coll Radiol. 2015 Feb;12(2): 143-50). Right-sided hemodialysis catheter in place. Upper chest: Visualized upper chest demonstrates no acute finding. Review of the MIP images confirms the above findings CTA HEAD FINDINGS Anterior circulation: Petrous segments patent bilaterally. Mild atheromatous change within the carotid siphons without hemodynamically significant stenosis. No made of a subtle 2 mm focal outpouching arising from the proximal cavernous right ICA, which could reflect a small aneurysm versus vascular infundibulum (series 7, image 254). A1 segments patent bilaterally. Normal anterior communicating artery complex. Anterior cerebral arteries patent to their distal aspects without stenosis. No M1 stenosis or occlusion. Normal MCA bifurcations. Distal MCA branches well perfused and symmetric. Posterior circulation: Both V4 segments patent to the vertebrobasilar junction  without stenosis. Both PICA origins patent and normal. Basilar patent to its distal aspect without stenosis. Superior cerebellar arteries patent bilaterally. Both PCA supplied via the basilar as well as prominent bilateral posterior communicating arteries. Atheromatous irregularity within the bilateral P2 segments with associated mild to moderate multifocal narrowing. PCAs remain patent to their  distal aspects. Venous sinuses: Grossly patent allowing for timing the contrast bolus. Anatomic variants: None significant. Review of the MIP images confirms the above findings CT Brain Perfusion Findings: ASPECTS: Not calculated. The CT perfusion portion of this exam unfortunately failed due to technical difficulty. No perfusion maps were generated. IMPRESSION: 1. Negative CTA for emergent large vessel occlusion. 2. Mild atheromatous change about the major arterial vasculature of the head and neck as above. No proximal high-grade or correctable stenosis. 3. 2 mm focal outpouching arising from the proximal cavernous right ICA, which could reflect a small aneurysm versus vascular infundibulum. Attention at follow-up recommended. 4. CT perfusion portion of this exam unfortunately failed due to technical difficulty. No perfusion maps were generated. Critical Value/emergent results were called by telephone at the time of interpretation on 05/05/2021 at 8:15 pm to provider ERIC [facility], who verbally acknowledged these results. Electronically Signed   By: Jeannine Boga M.D.   On: 05/05/2021 21:04   CT CEREBRAL PERFUSION W CONTRAST  Result Date: 05/05/2021 CLINICAL DATA:  Initial evaluation for acute stroke, TIA. EXAM: CT ANGIOGRAPHY HEAD AND NECK CT PERFUSION BRAIN TECHNIQUE: Multidetector CT imaging of the head and neck was performed using the standard protocol during bolus administration of intravenous contrast. Multiplanar CT image reconstructions and MIPs were obtained to evaluate the vascular anatomy. Carotid  stenosis measurements (when applicable) are obtained utilizing NASCET criteria, using the distal internal carotid diameter as the denominator. Multiphase CT imaging of the brain was performed following IV bolus contrast injection. Subsequent parametric perfusion maps were calculated using RAPID software. CONTRAST:  112mL OMNIPAQUE IOHEXOL 350 MG/ML SOLN COMPARISON:  Prior head CT from earlier the same day as well as previous MRA from 04/17/2021. FINDINGS: CTA NECK FINDINGS Aortic arch: Visualized aortic arch normal in caliber with normal 3 vessel morphology. No hemodynamically significant stenosis seen about the origin of the great vessels. Right carotid system: Right common and internal carotid arteries patent without stenosis, dissection or occlusion. Minimal atheromatous plaque about the proximal cervical right ICA without stenosis. Left carotid system: Left common and internal carotid arteries patent without stenosis, dissection or occlusion. Vertebral arteries: Both vertebral arteries arise from subclavian arteries. No proximal subclavian artery stenosis. Vertebral arteries diminutive but widely patent within the neck without stenosis, dissection or occlusion. Skeleton: No visible acute osseous finding. No discrete or worrisome osseous lesions. Other neck: No other acute soft tissue abnormality within the neck. 7 mm right thyroid nodule noted, of doubtful significance given size and patient age, no follow-up imaging recommended (ref: J Am Coll Radiol. 2015 Feb;12(2): 143-50). Right-sided hemodialysis catheter in place. Upper chest: Visualized upper chest demonstrates no acute finding. Review of the MIP images confirms the above findings CTA HEAD FINDINGS Anterior circulation: Petrous segments patent bilaterally. Mild atheromatous change within the carotid siphons without hemodynamically significant stenosis. No made of a subtle 2 mm focal outpouching arising from the proximal cavernous right ICA, which could  reflect a small aneurysm versus vascular infundibulum (series 7, image 254). A1 segments patent bilaterally. Normal anterior communicating artery complex. Anterior cerebral arteries patent to their distal aspects without stenosis. No M1 stenosis or occlusion. Normal MCA bifurcations. Distal MCA branches well perfused and symmetric. Posterior circulation: Both V4 segments patent to the vertebrobasilar junction without stenosis. Both PICA origins patent and normal. Basilar patent to its distal aspect without stenosis. Superior cerebellar arteries patent bilaterally. Both PCA supplied via the basilar as well as prominent bilateral posterior communicating arteries. Atheromatous irregularity within the bilateral P2 segments  with associated mild to moderate multifocal narrowing. PCAs remain patent to their distal aspects. Venous sinuses: Grossly patent allowing for timing the contrast bolus. Anatomic variants: None significant. Review of the MIP images confirms the above findings CT Brain Perfusion Findings: ASPECTS: Not calculated. The CT perfusion portion of this exam unfortunately failed due to technical difficulty. No perfusion maps were generated. IMPRESSION: 1. Negative CTA for emergent large vessel occlusion. 2. Mild atheromatous change about the major arterial vasculature of the head and neck as above. No proximal high-grade or correctable stenosis. 3. 2 mm focal outpouching arising from the proximal cavernous right ICA, which could reflect a small aneurysm versus vascular infundibulum. Attention at follow-up recommended. 4. CT perfusion portion of this exam unfortunately failed due to technical difficulty. No perfusion maps were generated. Critical Value/emergent results were called by telephone at the time of interpretation on 05/05/2021 at 8:15 pm to provider ERIC [facility], who verbally acknowledged these results. Electronically Signed   By: Jeannine Boga M.D.   On: 05/05/2021 21:04   DG Chest Port 1  View  Result Date: 05/05/2021 CLINICAL DATA:  Shortness of breath EXAM: PORTABLE CHEST 1 VIEW COMPARISON:  05/03/2021 FINDINGS: Cardiac shadow is mildly enlarged but stable. Right jugular central line is again seen deep in the right atrium but stable. Lungs are well aerated bilaterally with patchy stable atelectasis/scarring on the left. No acute abnormality is seen. IMPRESSION: No acute abnormality noted. Stable changes in the left base are seen. Electronically Signed   By: Inez Catalina M.D.   On: 05/05/2021 19:08     Personally Reviewed Labs: CBC: Recent Labs  Lab 04/30/21 0508 05/01/21 0553 05/03/21 1044 05/05/21 1131 05/06/21 0456  WBC 14.4* 13.0* 12.8* 10.1 8.8  NEUTROABS  --  6.9 7.9*  --   --   HGB 9.9* 9.8* 9.7* 11.1* 10.6*  HCT 28.3* 28.4* 29.5* 33.2* 31.0*  MCV 84.2 86.1 88.6 87.4 88.6  PLT 254 238 299 391 784   Basic Metabolic Panel: Recent Labs  Lab 04/29/21 1325 04/30/21 0508 05/01/21 0553 05/03/21 1044 05/05/21 1131  NA 129* 131* 132* 131* 132*  K 4.9 4.4 4.2 4.8 4.2  CL 94* 97* 98 97* 94*  CO2 26 25 27 25 30   GLUCOSE 397* 287* 163* 306* 123*  BUN 28* 21 23 21 14   CREATININE 5.73* 4.82* 5.50* 5.90* 5.78*  CALCIUM 8.6* 8.9 8.8* 8.6* 9.5  PHOS 4.2  --  4.7* 4.8* 5.1*   GFR: Estimated Creatinine Clearance: 8.5 mL/min (A) (by C-G formula based on SCr of 5.78 mg/dL (H)). Liver Function Tests: Recent Labs  Lab 04/29/21 1325 05/01/21 0553 05/03/21 1044 05/05/21 1131  ALBUMIN 3.1* 2.9* 2.8* 3.3*   No results for input(s): LIPASE, AMYLASE in the last 168 hours. No results for input(s): AMMONIA in the last 168 hours. Coagulation Profile: No results for input(s): INR, PROTIME in the last 168 hours. Cardiac Enzymes: No results for input(s): CKTOTAL, CKMB, CKMBINDEX, TROPONINI in the last 168 hours. BNP (last 3 results) No results for input(s): PROBNP in the last 8760 hours. HbA1C: No results for input(s): HGBA1C in the last 72 hours. CBG: Recent Labs   Lab 05/05/21 1648 05/05/21 1841 05/05/21 2106 05/06/21 0605 05/06/21 0654  GLUCAP 129* 152* 126* 82 72   Lipid Profile: No results for input(s): CHOL, HDL, LDLCALC, TRIG, CHOLHDL, LDLDIRECT in the last 72 hours. Thyroid Function Tests: No results for input(s): TSH, T4TOTAL, FREET4, T3FREE, THYROIDAB in the last 72 hours. Anemia Panel: No  results for input(s): VITAMINB12, FOLATE, FERRITIN, TIBC, IRON, RETICCTPCT in the last 72 hours. Urine analysis:    Component Value Date/Time   COLORURINE YELLOW 03/09/2021 2156   APPEARANCEUR HAZY (A) 03/09/2021 2156   LABSPEC 1.015 03/09/2021 2156   PHURINE 6.0 03/09/2021 2156   GLUCOSEU >=500 (A) 03/09/2021 2156   HGBUR SMALL (A) 03/09/2021 2156   BILIRUBINUR NEGATIVE 03/09/2021 2156   BILIRUBINUR negative 06/18/2019 1409   KETONESUR NEGATIVE 03/09/2021 2156   PROTEINUR >=300 (A) 03/09/2021 2156   UROBILINOGEN 0.2 06/18/2019 1409   NITRITE NEGATIVE 03/09/2021 2156   LEUKOCYTESUR NEGATIVE 03/09/2021 2156    Sepsis Labs:  None  Personally Reviewed EKG:  None  Assessment/Plan Strokelike symptoms-patient returning from CIR with concern for left-sided weakness and confusion.  She was recently hospitalized for acute CVA with left-sided weakness.  Evaluated by neurology.  CTA head and neck and MRI brain without acute CVA.  She seems to have better strength in the left arm compared to her right.  However, she is holding her left arm and somewhat contracted position close to her left chest.  Her symptoms could be just related to dialysis -Neurology recommended Plavix, aspirin, Crestor and SBP > 120. -Follow further neuro recommendations -PT/OT -Of note, it does not look like patient was started on antiplatelets after recent CVA  Controlled IDDM-2 with hyperglycemia and hyperlipidemia: Recent A1c 5.2% on 6/24.  Does not seem to be on medication per chart review. Recent Labs  Lab 05/05/21 1841 05/05/21 2106 05/06/21 0605 05/06/21 0654  05/06/21 1111  GLUCAP 152* 126* 82 72 82  -We will discontinue CBG monitoring and SSI if CBG remains normal. -Continue Crestor  Normocytic anemia/anemia of renal disease: H&H stable. History of lower GI bleed-normal EGD on 6/26.  Flex sigmoidoscopy at the same time showed hemorrhoids and large amount of stool obscuring visualization. Recent Labs    04/20/21 1002 04/22/21 0502 04/23/21 0436 04/27/21 1017 04/29/21 1325 04/30/21 0508 05/01/21 0553 05/03/21 1044 05/05/21 1131 05/06/21 0456  HGB 9.8* 9.4* 10.0* 9.6* 10.0* 9.9* 9.8* 9.7* 11.1* 10.6*  -Monitor   ESRD on HD MWF Bone mineral disorder -HD per nephrology  Chronic diastolic CHF/history of CAD: Appears euvolemic on exam.  No cardiopulmonary symptoms. -Fluid management by HD  Essential hypertension: BP within acceptable range. -Hold home Coreg and Lasix  Medication reconciliation  DVT prophylaxis: Subcu heparin  Code Status: Full code-confirmed with patient's husband Family Communication: Updated patient's husband over the phone.  Disposition Plan: Admit to telemetry bed Consults called: Neurology Admission status: Observation Level of care: Telemetry Medical   Mercy Riding MD [facility]  If 7PM-7AM, please contact night-coverage [email or web address]  05/06/2021, 7:39 AM

## 2021-05-06 NOTE — Progress Notes (Addendum)
STROKE TEAM PROGRESS NOTE   ATTENDING NOTE: I reviewed above note and agree with the assessment and plan. Pt was seen and examined.   70 year old female with history of obesity, diabetes, hypertension, hyperlipidemia, ESRD on dialysis admitted for left-sided weakness last month.  CT no acute abnormality on 6/22, however MRI in 6/24 showed right BG/CR infarct.  MRA head and neck negative.  EF 50% on 03/11/2021.  A1c 5.2, LDL 70. Etiology for patient stroke likely due to small vessel disease.  Discharged to CIR on aspirin 81 and Plavix for 3 weeks and then Plavix alone as well as Crestor 20.   Patient has been doing well in CIR, however, yesterday after dialysis patient was found to have new onset of AMS, oriented to self only and with mumbling speech, in conjunction with worsened left sided weakness, left hemineglect and rightward gaze deviation.  CT no acute abnormality, CT head and neck no LVO.  CT perfusion not successful.  MRI today showed no acute infarct, evolving prior right CR and BG infarct.  However, EEG showed right central parietal region spikes, consistent with potential epileptogenic focus.  On exam today, patient speech therapist at bedside, patient awake alert, orientated x3, no aphasia, follows simple commands, able to name repeat.  Visual fields full, no gaze palsy, still has left facial droop, left upper and lower extremity proximal 3+/5, distal 4+/5.  Right upper and lower extremity 5/5.  Sensation symmetrical, finger-to-nose no ataxia although slow on the left.  Etiology for patient episode yesterday could be seizure with postictal state, although AMS stat post dialysis still in DDx.  Will start [place] 500 nightly for seizure treatment and prevention in ESRD patient.  Also recommend gentle dialysis.  Continue monitoring and seizure precautions.  For detailed assessment and plan, please refer to above as I have made changes wherever appropriate.   Rosalin Hawking, MD PhD Stroke  Neurology 05/06/2021 5:29 PM    INTERVAL HISTORY No family at the bedside. Pt tells me the only "new" change since last time I saw her on (6/25) was that her "hemorrhoids are terrible" and this is her main complaint. Her neuro exam is stable and slightly improved some since my previous exam. She says she just felt bad during HD yesterday and is again "fine" today and wants to return to CIR to get stronger.   Vitals:   05/06/21 0650 05/06/21 0841 05/06/21 0850 05/06/21 1118  BP: (!) 146/70 133/63  (!) 143/72  Pulse: 72 79  80  Resp: 18 18  18   Temp: 98.6 F (37 C) 98.9 F (37.2 C)  98.2 F (36.8 C)  TempSrc: Oral Oral  Oral  SpO2:  100% 99% 100%   CBC:  Recent Labs  Lab 05/01/21 0553 05/03/21 1044 05/05/21 1131 05/06/21 0456  WBC 13.0* 12.8* 10.1 8.8  NEUTROABS 6.9 7.9*  --   --   HGB 9.8* 9.7* 11.1* 10.6*  HCT 28.4* 29.5* 33.2* 31.0*  MCV 86.1 88.6 87.4 88.6  PLT 238 299 391 382   Basic Metabolic Panel:  Recent Labs  Lab 05/03/21 1044 05/05/21 1131  NA 131* 132*  K 4.8 4.2  CL 97* 94*  CO2 25 30  GLUCOSE 306* 123*  BUN 21 14  CREATININE 5.90* 5.78*  CALCIUM 8.6* 9.5  PHOS 4.8* 5.1*   Lipid Panel: No results for input(s): CHOL, TRIG, HDL, CHOLHDL, VLDL, LDLCALC in the last 168 hours. NO reason to repeat since this was just done! HgbA1c: No results for  input(s): HGBA1C in the last 168 hours. Urine Drug Screen: No results for input(s): LABOPIA, COCAINSCRNUR, LABBENZ, AMPHETMU, THCU, LABBARB in the last 168 hours.  Alcohol Level No results for input(s): ETH in the last 168 hours.  IMAGING past 24 hours CT ANGIO HEAD W OR WO CONTRAST  Result Date: 05/05/2021 CLINICAL DATA:  Initial evaluation for acute stroke, TIA. EXAM: CT ANGIOGRAPHY HEAD AND NECK CT PERFUSION BRAIN TECHNIQUE: Multidetector CT imaging of the head and neck was performed using the standard protocol during bolus administration of intravenous contrast. Multiplanar CT image reconstructions and MIPs  were obtained to evaluate the vascular anatomy. Carotid stenosis measurements (when applicable) are obtained utilizing NASCET criteria, using the distal internal carotid diameter as the denominator. Multiphase CT imaging of the brain was performed following IV bolus contrast injection. Subsequent parametric perfusion maps were calculated using RAPID software. CONTRAST:  177mL OMNIPAQUE IOHEXOL 350 MG/ML SOLN COMPARISON:  Prior head CT from earlier the same day as well as previous MRA from 04/17/2021. FINDINGS: CTA NECK FINDINGS Aortic arch: Visualized aortic arch normal in caliber with normal 3 vessel morphology. No hemodynamically significant stenosis seen about the origin of the great vessels. Right carotid system: Right common and internal carotid arteries patent without stenosis, dissection or occlusion. Minimal atheromatous plaque about the proximal cervical right ICA without stenosis. Left carotid system: Left common and internal carotid arteries patent without stenosis, dissection or occlusion. Vertebral arteries: Both vertebral arteries arise from subclavian arteries. No proximal subclavian artery stenosis. Vertebral arteries diminutive but widely patent within the neck without stenosis, dissection or occlusion. Skeleton: No visible acute osseous finding. No discrete or worrisome osseous lesions. Other neck: No other acute soft tissue abnormality within the neck. 7 mm right thyroid nodule noted, of doubtful significance given size and patient age, no follow-up imaging recommended (ref: J Am Coll Radiol. 2015 Feb;12(2): 143-50). Right-sided hemodialysis catheter in place. Upper chest: Visualized upper chest demonstrates no acute finding. Review of the MIP images confirms the above findings CTA HEAD FINDINGS Anterior circulation: Petrous segments patent bilaterally. Mild atheromatous change within the carotid siphons without hemodynamically significant stenosis. No made of a subtle 2 mm focal outpouching  arising from the proximal cavernous right ICA, which could reflect a small aneurysm versus vascular infundibulum (series 7, image 254). A1 segments patent bilaterally. Normal anterior communicating artery complex. Anterior cerebral arteries patent to their distal aspects without stenosis. No M1 stenosis or occlusion. Normal MCA bifurcations. Distal MCA branches well perfused and symmetric. Posterior circulation: Both V4 segments patent to the vertebrobasilar junction without stenosis. Both PICA origins patent and normal. Basilar patent to its distal aspect without stenosis. Superior cerebellar arteries patent bilaterally. Both PCA supplied via the basilar as well as prominent bilateral posterior communicating arteries. Atheromatous irregularity within the bilateral P2 segments with associated mild to moderate multifocal narrowing. PCAs remain patent to their distal aspects. Venous sinuses: Grossly patent allowing for timing the contrast bolus. Anatomic variants: None significant. Review of the MIP images confirms the above findings CT Brain Perfusion Findings: ASPECTS: Not calculated. The CT perfusion portion of this exam unfortunately failed due to technical difficulty. No perfusion maps were generated. IMPRESSION: 1. Negative CTA for emergent large vessel occlusion. 2. Mild atheromatous change about the major arterial vasculature of the head and neck as above. No proximal high-grade or correctable stenosis. 3. 2 mm focal outpouching arising from the proximal cavernous right ICA, which could reflect a small aneurysm versus vascular infundibulum. Attention at follow-up recommended. 4. CT perfusion  portion of this exam unfortunately failed due to technical difficulty. No perfusion maps were generated. Critical Value/emergent results were called by telephone at the time of interpretation on 05/05/2021 at 8:15 pm to provider ERIC [facility], who verbally acknowledged these results. Electronically Signed   By: Jeannine Boga M.D.   On: 05/05/2021 21:04   CT HEAD WO CONTRAST  Result Date: 05/05/2021 CLINICAL DATA:  70 year old female with neurologic deficit. EXAM: CT HEAD WITHOUT CONTRAST TECHNIQUE: Contiguous axial images were obtained from the base of the skull through the vertex without intravenous contrast. COMPARISON:  Head CT dated 05/03/2021. FINDINGS: Brain: Mild age-related atrophy and moderate chronic microvascular ischemic changes. An area of infarct again noted lateral to the right thalamus, seen on the prior CT. There is no acute intracranial hemorrhage. No mass effect or midline shift. No extra-axial fluid collection. Vascular: No hyperdense vessel or unexpected calcification. Skull: Normal. Negative for fracture or focal lesion. Sinuses/Orbits: The visualized paranasal sinuses and mastoid air cells are clear. Bilateral maxillary sinus surgeries. Other: None IMPRESSION: 1. No acute intracranial pathology. 2. Age-related atrophy and chronic microvascular ischemic changes. Similar appearance of focal area of old infarct lateral to the right thalamus. Electronically Signed   By: Anner Crete M.D.   On: 05/05/2021 19:56   CT ANGIO NECK W OR WO CONTRAST  Result Date: 05/05/2021 CLINICAL DATA:  Initial evaluation for acute stroke, TIA. EXAM: CT ANGIOGRAPHY HEAD AND NECK CT PERFUSION BRAIN TECHNIQUE: Multidetector CT imaging of the head and neck was performed using the standard protocol during bolus administration of intravenous contrast. Multiplanar CT image reconstructions and MIPs were obtained to evaluate the vascular anatomy. Carotid stenosis measurements (when applicable) are obtained utilizing NASCET criteria, using the distal internal carotid diameter as the denominator. Multiphase CT imaging of the brain was performed following IV bolus contrast injection. Subsequent parametric perfusion maps were calculated using RAPID software. CONTRAST:  13mL OMNIPAQUE IOHEXOL 350 MG/ML SOLN COMPARISON:  Prior  head CT from earlier the same day as well as previous MRA from 04/17/2021. FINDINGS: CTA NECK FINDINGS Aortic arch: Visualized aortic arch normal in caliber with normal 3 vessel morphology. No hemodynamically significant stenosis seen about the origin of the great vessels. Right carotid system: Right common and internal carotid arteries patent without stenosis, dissection or occlusion. Minimal atheromatous plaque about the proximal cervical right ICA without stenosis. Left carotid system: Left common and internal carotid arteries patent without stenosis, dissection or occlusion. Vertebral arteries: Both vertebral arteries arise from subclavian arteries. No proximal subclavian artery stenosis. Vertebral arteries diminutive but widely patent within the neck without stenosis, dissection or occlusion. Skeleton: No visible acute osseous finding. No discrete or worrisome osseous lesions. Other neck: No other acute soft tissue abnormality within the neck. 7 mm right thyroid nodule noted, of doubtful significance given size and patient age, no follow-up imaging recommended (ref: J Am Coll Radiol. 2015 Feb;12(2): 143-50). Right-sided hemodialysis catheter in place. Upper chest: Visualized upper chest demonstrates no acute finding. Review of the MIP images confirms the above findings CTA HEAD FINDINGS Anterior circulation: Petrous segments patent bilaterally. Mild atheromatous change within the carotid siphons without hemodynamically significant stenosis. No made of a subtle 2 mm focal outpouching arising from the proximal cavernous right ICA, which could reflect a small aneurysm versus vascular infundibulum (series 7, image 254). A1 segments patent bilaterally. Normal anterior communicating artery complex. Anterior cerebral arteries patent to their distal aspects without stenosis. No M1 stenosis or occlusion. Normal MCA bifurcations. Distal MCA branches  well perfused and symmetric. Posterior circulation: Both V4 segments  patent to the vertebrobasilar junction without stenosis. Both PICA origins patent and normal. Basilar patent to its distal aspect without stenosis. Superior cerebellar arteries patent bilaterally. Both PCA supplied via the basilar as well as prominent bilateral posterior communicating arteries. Atheromatous irregularity within the bilateral P2 segments with associated mild to moderate multifocal narrowing. PCAs remain patent to their distal aspects. Venous sinuses: Grossly patent allowing for timing the contrast bolus. Anatomic variants: None significant. Review of the MIP images confirms the above findings CT Brain Perfusion Findings: ASPECTS: Not calculated. The CT perfusion portion of this exam unfortunately failed due to technical difficulty. No perfusion maps were generated. IMPRESSION: 1. Negative CTA for emergent large vessel occlusion. 2. Mild atheromatous change about the major arterial vasculature of the head and neck as above. No proximal high-grade or correctable stenosis. 3. 2 mm focal outpouching arising from the proximal cavernous right ICA, which could reflect a small aneurysm versus vascular infundibulum. Attention at follow-up recommended. 4. CT perfusion portion of this exam unfortunately failed due to technical difficulty. No perfusion maps were generated. Critical Value/emergent results were called by telephone at the time of interpretation on 05/05/2021 at 8:15 pm to provider ERIC [facility], who verbally acknowledged these results. Electronically Signed   By: Jeannine Boga M.D.   On: 05/05/2021 21:04   MR BRAIN WO CONTRAST  Result Date: 05/06/2021 CLINICAL DATA:  Left-sided weakness EXAM: MRI HEAD WITHOUT CONTRAST TECHNIQUE: Multiplanar, multiecho pulse sequences of the brain and surrounding structures were obtained without intravenous contrast. COMPARISON:  04/16/2021 FINDINGS: Significant motion artifact is present. Brain: There is persistent diffusion hyperintensity with decreased  ADC hypointensity involving the right corona radiata and basal ganglia. No new abnormal diffusion. There is no acute infarction or intracranial hemorrhage. There is no intracranial mass, mass effect, or edema. There is no hydrocephalus or extra-axial fluid collection. Prominence of the ventricles and sulci reflects stable parenchymal volume loss. Patchy and confluent areas of T2 hyperintensity in the supratentorial and pontine white matter are nonspecific but probably reflects stable chronic microvascular ischemic changes. Very small chronic right cerebellar infarct is noted. Vascular: Major vessel flow voids at the skull base are preserved. Skull and upper cervical spine: Normal marrow signal is grossly preserved. Sinuses/Orbits: Paranasal sinuses are aerated. Bilateral lens replacements. Other: Sella is unremarkable.  Mastoid air cells are clear. IMPRESSION: No acute infarction. Evolving prior infarction of the right corona radiata and basal ganglia. Chronic microvascular ischemic changes. Electronically Signed   By: Macy Mis M.D.   On: 05/06/2021 10:40   CT CEREBRAL PERFUSION W CONTRAST  Result Date: 05/05/2021 CLINICAL DATA:  Initial evaluation for acute stroke, TIA. EXAM: CT ANGIOGRAPHY HEAD AND NECK CT PERFUSION BRAIN TECHNIQUE: Multidetector CT imaging of the head and neck was performed using the standard protocol during bolus administration of intravenous contrast. Multiplanar CT image reconstructions and MIPs were obtained to evaluate the vascular anatomy. Carotid stenosis measurements (when applicable) are obtained utilizing NASCET criteria, using the distal internal carotid diameter as the denominator. Multiphase CT imaging of the brain was performed following IV bolus contrast injection. Subsequent parametric perfusion maps were calculated using RAPID software. CONTRAST:  129mL OMNIPAQUE IOHEXOL 350 MG/ML SOLN COMPARISON:  Prior head CT from earlier the same day as well as previous MRA from  04/17/2021. FINDINGS: CTA NECK FINDINGS Aortic arch: Visualized aortic arch normal in caliber with normal 3 vessel morphology. No hemodynamically significant stenosis seen about the origin of the great  vessels. Right carotid system: Right common and internal carotid arteries patent without stenosis, dissection or occlusion. Minimal atheromatous plaque about the proximal cervical right ICA without stenosis. Left carotid system: Left common and internal carotid arteries patent without stenosis, dissection or occlusion. Vertebral arteries: Both vertebral arteries arise from subclavian arteries. No proximal subclavian artery stenosis. Vertebral arteries diminutive but widely patent within the neck without stenosis, dissection or occlusion. Skeleton: No visible acute osseous finding. No discrete or worrisome osseous lesions. Other neck: No other acute soft tissue abnormality within the neck. 7 mm right thyroid nodule noted, of doubtful significance given size and patient age, no follow-up imaging recommended (ref: J Am Coll Radiol. 2015 Feb;12(2): 143-50). Right-sided hemodialysis catheter in place. Upper chest: Visualized upper chest demonstrates no acute finding. Review of the MIP images confirms the above findings CTA HEAD FINDINGS Anterior circulation: Petrous segments patent bilaterally. Mild atheromatous change within the carotid siphons without hemodynamically significant stenosis. No made of a subtle 2 mm focal outpouching arising from the proximal cavernous right ICA, which could reflect a small aneurysm versus vascular infundibulum (series 7, image 254). A1 segments patent bilaterally. Normal anterior communicating artery complex. Anterior cerebral arteries patent to their distal aspects without stenosis. No M1 stenosis or occlusion. Normal MCA bifurcations. Distal MCA branches well perfused and symmetric. Posterior circulation: Both V4 segments patent to the vertebrobasilar junction without stenosis. Both PICA  origins patent and normal. Basilar patent to its distal aspect without stenosis. Superior cerebellar arteries patent bilaterally. Both PCA supplied via the basilar as well as prominent bilateral posterior communicating arteries. Atheromatous irregularity within the bilateral P2 segments with associated mild to moderate multifocal narrowing. PCAs remain patent to their distal aspects. Venous sinuses: Grossly patent allowing for timing the contrast bolus. Anatomic variants: None significant. Review of the MIP images confirms the above findings CT Brain Perfusion Findings: ASPECTS: Not calculated. The CT perfusion portion of this exam unfortunately failed due to technical difficulty. No perfusion maps were generated. IMPRESSION: 1. Negative CTA for emergent large vessel occlusion. 2. Mild atheromatous change about the major arterial vasculature of the head and neck as above. No proximal high-grade or correctable stenosis. 3. 2 mm focal outpouching arising from the proximal cavernous right ICA, which could reflect a small aneurysm versus vascular infundibulum. Attention at follow-up recommended. 4. CT perfusion portion of this exam unfortunately failed due to technical difficulty. No perfusion maps were generated. Critical Value/emergent results were called by telephone at the time of interpretation on 05/05/2021 at 8:15 pm to provider ERIC [facility], who verbally acknowledged these results. Electronically Signed   By: Jeannine Boga M.D.   On: 05/05/2021 21:04   DG Chest Port 1 View  Result Date: 05/05/2021 CLINICAL DATA:  Shortness of breath EXAM: PORTABLE CHEST 1 VIEW COMPARISON:  05/03/2021 FINDINGS: Cardiac shadow is mildly enlarged but stable. Right jugular central line is again seen deep in the right atrium but stable. Lungs are well aerated bilaterally with patchy stable atelectasis/scarring on the left. No acute abnormality is seen. IMPRESSION: No acute abnormality noted. Stable changes in the left base  are seen. Electronically Signed   By: Inez Catalina M.D.   On: 05/05/2021 19:08    PHYSICAL EXAM General: Appears well-developed; no distress. Psych: Affect appropriate to situation Eyes: No scleral injection HENT: No OP obstrucion Head: Normocephalic.  Cardiovascular: Normal rate and regular rhythm.  Respiratory: Effort normal and breath sounds normal to anterior ascultation GI: Soft.  No distension. There is no tenderness.  Skin:  WDI    Neurological Examination Mental Status: Alert, oriented, thought content appropriate.  Speech fluent without evidence of aphasia. Able to follow 3 step commands without difficulty. Cranial Nerves: II: Visual fields grossly normal, III,IV, VI: ptosis not present, extra-ocular motions intact bilaterally, pupils equal, round, reactive to light and accommodation V,VII: Left facial droop. slight lack of sensation on left side of face.  VIII: hearing normal bilaterally IX,X: uvula rises symmetrically XI: decreased shoulder shrug on left- c/o shoulder pain XII: midline tongue extension Motor:Tone and bulk:normal tone throughout; no atrophy noted. Rt is 5/5. Left: 4/5 arm, 4/5 leg Sensory: States she feels equal to light touch bilaterally, no extinction Deep Tendon Reflexes: 2+ and symmetric throughout Cerebellar: not out of proportion to weakness Gait: normal gait and station   ASSESSMENT/PLAN Carolyn Russo is a 70 y.o. female with history of recent stroke and d/c on 6/28 to CIR who presented again to our service as a "CODE STROKE" with worsening of previous stroke symptoms and AMS just after HD on 7/13. She is now back to baseline (post stroke). MRI neg for new ischemia. Most likely she decompensated acutely in setting of HD, ddx post stroke seizures. Will ck EEG.  No reason to repeat previous stroke work up. Recommend return to CIR ASAP.   Updated: Reviewed EEG findings this afternoon: This study showed evidence of potential epileptogenicity arising  from right centro-parietal region. There is also cortical dysfunction arising from right hemisphere likely secondary to underlying stroke. Will add renal dosed Keppr 500mg  QHS to start tonight.   Hospital day # 0  Desiree Metzger-Cihelka, ARNP-C, ANVP-BC Pager: [phone]   To contact Stroke Continuity provider, please refer to [email or web address] After hours, contact General Neurology

## 2021-05-06 NOTE — Progress Notes (Signed)
PA Pam called and given phone number for Dr. Nevada Crane.

## 2021-05-06 NOTE — Progress Notes (Signed)
Writer spoke to patient ( bed) placement re: still waiting for bed.

## 2021-05-06 NOTE — Consult Note (Signed)
Medical Consultation   Carolyn Russo  ZWC:[id number]  DOB: [birth year]  DOA: 04/20/2021  PCP: [name], MD  Outpatient Specialists: Neurology, nephrology.   Requesting physician: Reesa Chew, PA-inpatient rehab.  Reason for consultation: Acute onset of left-sided weakness with concern for recurrent stroke.  Requested admission to acute hospital however no beds are available at the time of this dictation.  History of Present Illness: Carolyn Russo is an 70 y.o. female with history of ESRD on HD since June 2020, lower GI bleed, hypertension, hyperlipidemia, chronic diastolic CHF who was admitted on 04/17/2019 through 04/20/2021 due to acute CVA and acute lower GI bleed with blood loss anemia.  She was then discharged to inpatient rehab.  On 713 evening rapid response was called due to confusion and restlessness post hemodialysis.  Code stroke was called, seen by neurology and stroke team, Suspects most likely etiology for patient's acute neurological worsening is watershed TIA/infarction of the right cerebral hemisphere.  Patient was not a tPA candidate due to recent stroke.  Neurology recommended admission to the medical floor from rehab for further care given recurrence of acute stroke.  However at the time of this dictation no beds are available.  Patient was seen and examined with her husband.  [place], ophthalmology at bedside.  Patient is minimally interactive and somnolent.  Not in acute distress.  Discussed with neurology recommended to continue DAPT with aspirin and Plavix, Crestor and to maintain systolic BP greater than 235.  Review of Systems:  ROS As per HPI otherwise 10 point review of systems negative.     Past Medical History: Past Medical History:  Diagnosis Date   Acute GI bleeding    Allergy    Anemia    Anterior chest wall pain    Appendicitis 1965   Asthma    Body mass index 37.0-37.9, adult    Breast pain    Cataract    both eyes   CHF  (congestive heart failure) ([place])    Chronic kidney disease    stage 5 - on dialysis   Cognitive change 04/20/2021   r/t cva 03/2821   Dehydration 2014   Deviated septum 1971   Diabetes mellitus    no meds   Dyspnea 2014   Extrinsic asthma    WITH ASTHMA ATTACK   Fibroid 1980   GERD (gastroesophageal reflux disease)    Heart murmur    Hx gestational diabetes    Hyperlipidemia    Hypertension 2014   Inguinal hernia 1959   Malaise and fatigue 2014   Non-IgE mediated allergic asthma 2014   Obesity    Pelvic pain    Pregnancy, high-risk 1985   Stroke ([place]) 04/20/2021   (CVA) of right basal ganglia   Tonsillitis 1968   Uterine fibroid 1980    Past Surgical History: Past Surgical History:  Procedure Laterality Date   APPENDECTOMY  3614   [place] Right 04/30/2021   Procedure: RIGHT FIRST STAGE [place];  Surgeon: Angelia Mould, MD;  Location: [facility] OR;  Service: Vascular;  Laterality: Right;   CESAREAN SECTION  1985   COLONOSCOPY     ESOPHAGOGASTRODUODENOSCOPY (EGD) WITH PROPOFOL N/A 04/18/2021   Procedure: ESOPHAGOGASTRODUODENOSCOPY (EGD) WITH PROPOFOL;  Surgeon: Gatha Mayer, MD;  Location[facility] ENDOSCOPY;  Service: Endoscopy;  Laterality: N/A;   EYE SURGERY     bilateral cataract    FLEXIBLE SIGMOIDOSCOPY  N/A 04/18/2021   Procedure: FLEXIBLE SIGMOIDOSCOPY;  Surgeon: Gatha Mayer, MD;  Location: [facility] ENDOSCOPY;  Service: Endoscopy;  Laterality: N/A;   HERNIA REPAIR  1959   IR FLUORO GUIDE CV LINE RIGHT  03/18/2021   IR US GUIDE VASC ACCESS RIGHT  03/18/2021   LEFT HEART CATH AND CORONARY ANGIOGRAPHY N/A 04/04/2017   Procedure: Left Heart Cath and Coronary Angiography;  Surgeon: Belva Crome, MD;  Location: [facility] CV LAB;  Service: Cardiovascular;  Laterality: N/A;   MYOMECTOMY  1980, 2004, 2007   RHINOPLASTY  1971   ROTATOR CUFF REPAIR  2003   SURGICAL REPAIR OF HEMORRHAGE  2015   TONSILLECTOMY  1968     Allergies:    Allergies  Allergen Reactions   Food Anaphylaxis    Peanuts; Almonds   Other Shortness Of Breath    Peanuts; Almonds   Wheat Bran Shortness Of Breath   Statins Itching and Other (See Comments)    Generalized aches   Iodine Other (See Comments)    The patient denied this allergy to me on 03/16/2021   Shellfish Allergy Other (See Comments)    Mouth gets raw   Sitagliptin     Other reaction(s): Unknown   Tetracycline Other (See Comments)    Raw mouth   Tetracyclines & Related Itching   Contrast Media [Iodinated Diagnostic Agents] Rash    Happened during CT scan over 30 years ago     Social History:  reports that she has never smoked. She has never used smokeless tobacco. She reports that she does not drink alcohol and does not use drugs.   Family History: Family History  Problem Relation Age of Onset   Cancer Mother        abdominal melamona   Psoriasis Mother    Alzheimer's disease Father    Cancer Cousin        colon    Diabetes Cousin    Diabetes Maternal Aunt    Colon cancer Neg Hx    Colon polyps Neg Hx    Esophageal cancer Neg Hx    Rectal cancer Neg Hx    Stomach cancer Neg Hx    Breast cancer Neg Hx     Physical Exam: Vitals:   05/05/21 1812 05/05/21 1823 05/05/21 2108 05/05/21 2305  BP: (!) 143/126 (!) 151/62 (!) 168/75 (!) 151/86  Pulse: 83 83 83 78  Resp: (!) 28  20 19   Temp: 98.1 F (36.7 C)  (!) 97.5 F (36.4 C) 98.4 F (36.9 C)  TempSrc: Oral  Oral Oral  SpO2: 100% 100% 99% 96%  Weight:      Height:        Constitutional: Somnolent but easily arousable to voices not in any acute distress. Eyes: PERLA, EOMI, irises appear normal, anicteric sclera,  ENMT: external ears and nose appear normal.  Lips appears normal, oropharynx mucosa, tongue, posterior pharynx appear normal  Neck: neck appears normal, no masses, normal ROM, no thyromegaly, no JVD  CVS: S1-S2 clear, no murmur rubs or gallops, no LE edema, normal pedal pulses  Respiratory:   clear to auscultation bilaterally, no wheezing, rales or rhonchi. Respiratory effort normal. No accessory muscle use.  Abdomen: soft nontender, nondistended, normal bowel sounds, no hepatosplenomegaly, no hernias  Musculoskeletal: : no cyanosis, clubbing or edema noted bilaterally Neuro: Cranial nerves II-XII intact, strength, sensation, reflexes Psych: Unable to assess mood or judgment due to somnolence. Skin: no rashes or lesions or ulcers, no induration or  nodules     Data reviewed:  I have personally reviewed following labs and imaging studies Labs:  CBC: Recent Labs  Lab 04/29/21 1325 04/30/21 0508 05/01/21 0553 05/03/21 1044 05/05/21 1131  WBC 12.1* 14.4* 13.0* 12.8* 10.1  NEUTROABS  --   --  6.9 7.9*  --   HGB 10.0* 9.9* 9.8* 9.7* 11.1*  HCT 29.5* 28.3* 28.4* 29.5* 33.2*  MCV 86.8 84.2 86.1 88.6 87.4  PLT 261 254 238 299 222    Basic Metabolic Panel: Recent Labs  Lab 04/29/21 1325 04/30/21 0508 05/01/21 0553 05/03/21 1044 05/05/21 1131  NA 129* 131* 132* 131* 132*  K 4.9 4.4 4.2 4.8 4.2  CL 94* 97* 98 97* 94*  CO2 26 25 27 25 30   GLUCOSE 397* 287* 163* 306* 123*  BUN 28* 21 23 21 14   CREATININE 5.73* 4.82* 5.50* 5.90* 5.78*  CALCIUM 8.6* 8.9 8.8* 8.6* 9.5  PHOS 4.2  --  4.7* 4.8* 5.1*   GFR Estimated Creatinine Clearance: 8.6 mL/min (A) (by C-G formula based on SCr of 5.78 mg/dL (H)). Liver Function Tests: Recent Labs  Lab 04/29/21 1325 05/01/21 0553 05/03/21 1044 05/05/21 1131  ALBUMIN 3.1* 2.9* 2.8* 3.3*   No results for input(s): LIPASE, AMYLASE in the last 168 hours. No results for input(s): AMMONIA in the last 168 hours. Coagulation profile No results for input(s): INR, PROTIME in the last 168 hours.  Cardiac Enzymes: No results for input(s): CKTOTAL, CKMB, CKMBINDEX, TROPONINI in the last 168 hours. BNP: Invalid input(s): POCBNP CBG: Recent Labs  Lab 05/04/21 2108 05/05/21 0620 05/05/21 1648 05/05/21 1841 05/05/21 2106  GLUCAP 145*  73 129* 152* 126*   D-Dimer No results for input(s): DDIMER in the last 72 hours. Hgb A1c No results for input(s): HGBA1C in the last 72 hours. Lipid Profile No results for input(s): CHOL, HDL, LDLCALC, TRIG, CHOLHDL, LDLDIRECT in the last 72 hours. Thyroid function studies No results for input(s): TSH, T4TOTAL, T3FREE, THYROIDAB in the last 72 hours.  Invalid input(s): FREET3 Anemia work up No results for input(s): VITAMINB12, FOLATE, FERRITIN, TIBC, IRON, RETICCTPCT in the last 72 hours. Urinalysis    Component Value Date/Time   COLORURINE YELLOW 03/09/2021 2156   APPEARANCEUR HAZY (A) 03/09/2021 2156   LABSPEC 1.015 03/09/2021 2156   PHURINE 6.0 03/09/2021 2156   GLUCOSEU >=500 (A) 03/09/2021 2156   HGBUR SMALL (A) 03/09/2021 2156   BILIRUBINUR NEGATIVE 03/09/2021 2156   BILIRUBINUR negative 06/18/2019 1409   KETONESUR NEGATIVE 03/09/2021 2156   PROTEINUR >=300 (A) 03/09/2021 2156   UROBILINOGEN 0.2 06/18/2019 1409   NITRITE NEGATIVE 03/09/2021 2156   LEUKOCYTESUR NEGATIVE 03/09/2021 2156     Microbiology No results found for this or any previous visit (from the past 240 hour(s)).     Inpatient Medications:   Scheduled Meds:  arformoterol  15 mcg Nebulization BID   aspirin EC  81 mg Oral Daily   budesonide (PULMICORT) nebulizer solution  0.5 mg Nebulization BID   carvedilol  12.5 mg Oral BID WC   Chlorhexidine Gluconate Cloth  6 each Topical Q0600   clopidogrel  75 mg Oral Daily   [START ON 05/07/2021] darbepoetin (ARANESP) injection - DIALYSIS  100 mcg Intravenous Q Fri-HD   diclofenac Sodium  2 g Topical QID   fluticasone  2 spray Each Nare Daily   furosemide  40 mg Oral Q M,W,F   hydrocortisone  25 mg Rectal BID   insulin glargine  13 Units Subcutaneous BID  ketotifen  2 drop Both Eyes BID   lidocaine  2 patch Transdermal Q24H   losartan  25 mg Oral QPM   melatonin  3 mg Oral QHS   ondansetron  4 mg Oral Q M,W,F-HD   pantoprazole  40 mg Oral BID    rosuvastatin  20 mg Oral Daily   senna-docusate  2 tablet Oral BID   sevelamer carbonate  0.8 g Oral TID WC   Continuous Infusions:  sodium chloride 10 mL/hr at 04/30/21 1249     Radiological Exams on Admission: CT ANGIO HEAD W OR WO CONTRAST  Result Date: 05/05/2021 CLINICAL DATA:  Initial evaluation for acute stroke, TIA. EXAM: CT ANGIOGRAPHY HEAD AND NECK CT PERFUSION BRAIN TECHNIQUE: Multidetector CT imaging of the head and neck was performed using the standard protocol during bolus administration of intravenous contrast. Multiplanar CT image reconstructions and MIPs were obtained to evaluate the vascular anatomy. Carotid stenosis measurements (when applicable) are obtained utilizing NASCET criteria, using the distal internal carotid diameter as the denominator. Multiphase CT imaging of the brain was performed following IV bolus contrast injection. Subsequent parametric perfusion maps were calculated using RAPID software. CONTRAST:  150mL OMNIPAQUE IOHEXOL 350 MG/ML SOLN COMPARISON:  Prior head CT from earlier the same day as well as previous MRA from 04/17/2021. FINDINGS: CTA NECK FINDINGS Aortic arch: Visualized aortic arch normal in caliber with normal 3 vessel morphology. No hemodynamically significant stenosis seen about the origin of the great vessels. Right carotid system: Right common and internal carotid arteries patent without stenosis, dissection or occlusion. Minimal atheromatous plaque about the proximal cervical right ICA without stenosis. Left carotid system: Left common and internal carotid arteries patent without stenosis, dissection or occlusion. Vertebral arteries: Both vertebral arteries arise from subclavian arteries. No proximal subclavian artery stenosis. Vertebral arteries diminutive but widely patent within the neck without stenosis, dissection or occlusion. Skeleton: No visible acute osseous finding. No discrete or worrisome osseous lesions. Other neck: No other acute soft  tissue abnormality within the neck. 7 mm right thyroid nodule noted, of doubtful significance given size and patient age, no follow-up imaging recommended (ref: J Am Coll Radiol. 2015 Feb;12(2): 143-50). Right-sided hemodialysis catheter in place. Upper chest: Visualized upper chest demonstrates no acute finding. Review of the MIP images confirms the above findings CTA HEAD FINDINGS Anterior circulation: Petrous segments patent bilaterally. Mild atheromatous change within the carotid siphons without hemodynamically significant stenosis. No made of a subtle 2 mm focal outpouching arising from the proximal cavernous right ICA, which could reflect a small aneurysm versus vascular infundibulum (series 7, image 254). A1 segments patent bilaterally. Normal anterior communicating artery complex. Anterior cerebral arteries patent to their distal aspects without stenosis. No M1 stenosis or occlusion. Normal MCA bifurcations. Distal MCA branches well perfused and symmetric. Posterior circulation: Both V4 segments patent to the vertebrobasilar junction without stenosis. Both PICA origins patent and normal. Basilar patent to its distal aspect without stenosis. Superior cerebellar arteries patent bilaterally. Both PCA supplied via the basilar as well as prominent bilateral posterior communicating arteries. Atheromatous irregularity within the bilateral P2 segments with associated mild to moderate multifocal narrowing. PCAs remain patent to their distal aspects. Venous sinuses: Grossly patent allowing for timing the contrast bolus. Anatomic variants: None significant. Review of the MIP images confirms the above findings CT Brain Perfusion Findings: ASPECTS: Not calculated. The CT perfusion portion of this exam unfortunately failed due to technical difficulty. No perfusion maps were generated. IMPRESSION: 1. Negative CTA for emergent large vessel  occlusion. 2. Mild atheromatous change about the major arterial vasculature of the  head and neck as above. No proximal high-grade or correctable stenosis. 3. 2 mm focal outpouching arising from the proximal cavernous right ICA, which could reflect a small aneurysm versus vascular infundibulum. Attention at follow-up recommended. 4. CT perfusion portion of this exam unfortunately failed due to technical difficulty. No perfusion maps were generated. Critical Value/emergent results were called by telephone at the time of interpretation on 05/05/2021 at 8:15 pm to provider ERIC [facility], who verbally acknowledged these results. Electronically Signed   By: Jeannine Boga M.D.   On: 05/05/2021 21:04   CT HEAD WO CONTRAST  Result Date: 05/05/2021 CLINICAL DATA:  70 year old female with neurologic deficit. EXAM: CT HEAD WITHOUT CONTRAST TECHNIQUE: Contiguous axial images were obtained from the base of the skull through the vertex without intravenous contrast. COMPARISON:  Head CT dated 05/03/2021. FINDINGS: Brain: Mild age-related atrophy and moderate chronic microvascular ischemic changes. An area of infarct again noted lateral to the right thalamus, seen on the prior CT. There is no acute intracranial hemorrhage. No mass effect or midline shift. No extra-axial fluid collection. Vascular: No hyperdense vessel or unexpected calcification. Skull: Normal. Negative for fracture or focal lesion. Sinuses/Orbits: The visualized paranasal sinuses and mastoid air cells are clear. Bilateral maxillary sinus surgeries. Other: None IMPRESSION: 1. No acute intracranial pathology. 2. Age-related atrophy and chronic microvascular ischemic changes. Similar appearance of focal area of old infarct lateral to the right thalamus. Electronically Signed   By: Anner Crete M.D.   On: 05/05/2021 19:56   CT ANGIO NECK W OR WO CONTRAST  Result Date: 05/05/2021 CLINICAL DATA:  Initial evaluation for acute stroke, TIA. EXAM: CT ANGIOGRAPHY HEAD AND NECK CT PERFUSION BRAIN TECHNIQUE: Multidetector CT imaging of the  head and neck was performed using the standard protocol during bolus administration of intravenous contrast. Multiplanar CT image reconstructions and MIPs were obtained to evaluate the vascular anatomy. Carotid stenosis measurements (when applicable) are obtained utilizing NASCET criteria, using the distal internal carotid diameter as the denominator. Multiphase CT imaging of the brain was performed following IV bolus contrast injection. Subsequent parametric perfusion maps were calculated using RAPID software. CONTRAST:  126mL OMNIPAQUE IOHEXOL 350 MG/ML SOLN COMPARISON:  Prior head CT from earlier the same day as well as previous MRA from 04/17/2021. FINDINGS: CTA NECK FINDINGS Aortic arch: Visualized aortic arch normal in caliber with normal 3 vessel morphology. No hemodynamically significant stenosis seen about the origin of the great vessels. Right carotid system: Right common and internal carotid arteries patent without stenosis, dissection or occlusion. Minimal atheromatous plaque about the proximal cervical right ICA without stenosis. Left carotid system: Left common and internal carotid arteries patent without stenosis, dissection or occlusion. Vertebral arteries: Both vertebral arteries arise from subclavian arteries. No proximal subclavian artery stenosis. Vertebral arteries diminutive but widely patent within the neck without stenosis, dissection or occlusion. Skeleton: No visible acute osseous finding. No discrete or worrisome osseous lesions. Other neck: No other acute soft tissue abnormality within the neck. 7 mm right thyroid nodule noted, of doubtful significance given size and patient age, no follow-up imaging recommended (ref: J Am Coll Radiol. 2015 Feb;12(2): 143-50). Right-sided hemodialysis catheter in place. Upper chest: Visualized upper chest demonstrates no acute finding. Review of the MIP images confirms the above findings CTA HEAD FINDINGS Anterior circulation: Petrous segments patent  bilaterally. Mild atheromatous change within the carotid siphons without hemodynamically significant stenosis. No made of a subtle 2 mm  focal outpouching arising from the proximal cavernous right ICA, which could reflect a small aneurysm versus vascular infundibulum (series 7, image 254). A1 segments patent bilaterally. Normal anterior communicating artery complex. Anterior cerebral arteries patent to their distal aspects without stenosis. No M1 stenosis or occlusion. Normal MCA bifurcations. Distal MCA branches well perfused and symmetric. Posterior circulation: Both V4 segments patent to the vertebrobasilar junction without stenosis. Both PICA origins patent and normal. Basilar patent to its distal aspect without stenosis. Superior cerebellar arteries patent bilaterally. Both PCA supplied via the basilar as well as prominent bilateral posterior communicating arteries. Atheromatous irregularity within the bilateral P2 segments with associated mild to moderate multifocal narrowing. PCAs remain patent to their distal aspects. Venous sinuses: Grossly patent allowing for timing the contrast bolus. Anatomic variants: None significant. Review of the MIP images confirms the above findings CT Brain Perfusion Findings: ASPECTS: Not calculated. The CT perfusion portion of this exam unfortunately failed due to technical difficulty. No perfusion maps were generated. IMPRESSION: 1. Negative CTA for emergent large vessel occlusion. 2. Mild atheromatous change about the major arterial vasculature of the head and neck as above. No proximal high-grade or correctable stenosis. 3. 2 mm focal outpouching arising from the proximal cavernous right ICA, which could reflect a small aneurysm versus vascular infundibulum. Attention at follow-up recommended. 4. CT perfusion portion of this exam unfortunately failed due to technical difficulty. No perfusion maps were generated. Critical Value/emergent results were called by telephone at the  time of interpretation on 05/05/2021 at 8:15 pm to provider ERIC [facility], who verbally acknowledged these results. Electronically Signed   By: Jeannine Boga M.D.   On: 05/05/2021 21:04   CT CEREBRAL PERFUSION W CONTRAST  Result Date: 05/05/2021 CLINICAL DATA:  Initial evaluation for acute stroke, TIA. EXAM: CT ANGIOGRAPHY HEAD AND NECK CT PERFUSION BRAIN TECHNIQUE: Multidetector CT imaging of the head and neck was performed using the standard protocol during bolus administration of intravenous contrast. Multiplanar CT image reconstructions and MIPs were obtained to evaluate the vascular anatomy. Carotid stenosis measurements (when applicable) are obtained utilizing NASCET criteria, using the distal internal carotid diameter as the denominator. Multiphase CT imaging of the brain was performed following IV bolus contrast injection. Subsequent parametric perfusion maps were calculated using RAPID software. CONTRAST:  144mL OMNIPAQUE IOHEXOL 350 MG/ML SOLN COMPARISON:  Prior head CT from earlier the same day as well as previous MRA from 04/17/2021. FINDINGS: CTA NECK FINDINGS Aortic arch: Visualized aortic arch normal in caliber with normal 3 vessel morphology. No hemodynamically significant stenosis seen about the origin of the great vessels. Right carotid system: Right common and internal carotid arteries patent without stenosis, dissection or occlusion. Minimal atheromatous plaque about the proximal cervical right ICA without stenosis. Left carotid system: Left common and internal carotid arteries patent without stenosis, dissection or occlusion. Vertebral arteries: Both vertebral arteries arise from subclavian arteries. No proximal subclavian artery stenosis. Vertebral arteries diminutive but widely patent within the neck without stenosis, dissection or occlusion. Skeleton: No visible acute osseous finding. No discrete or worrisome osseous lesions. Other neck: No other acute soft tissue abnormality within  the neck. 7 mm right thyroid nodule noted, of doubtful significance given size and patient age, no follow-up imaging recommended (ref: J Am Coll Radiol. 2015 Feb;12(2): 143-50). Right-sided hemodialysis catheter in place. Upper chest: Visualized upper chest demonstrates no acute finding. Review of the MIP images confirms the above findings CTA HEAD FINDINGS Anterior circulation: Petrous segments patent bilaterally. Mild atheromatous change within the carotid  siphons without hemodynamically significant stenosis. No made of a subtle 2 mm focal outpouching arising from the proximal cavernous right ICA, which could reflect a small aneurysm versus vascular infundibulum (series 7, image 254). A1 segments patent bilaterally. Normal anterior communicating artery complex. Anterior cerebral arteries patent to their distal aspects without stenosis. No M1 stenosis or occlusion. Normal MCA bifurcations. Distal MCA branches well perfused and symmetric. Posterior circulation: Both V4 segments patent to the vertebrobasilar junction without stenosis. Both PICA origins patent and normal. Basilar patent to its distal aspect without stenosis. Superior cerebellar arteries patent bilaterally. Both PCA supplied via the basilar as well as prominent bilateral posterior communicating arteries. Atheromatous irregularity within the bilateral P2 segments with associated mild to moderate multifocal narrowing. PCAs remain patent to their distal aspects. Venous sinuses: Grossly patent allowing for timing the contrast bolus. Anatomic variants: None significant. Review of the MIP images confirms the above findings CT Brain Perfusion Findings: ASPECTS: Not calculated. The CT perfusion portion of this exam unfortunately failed due to technical difficulty. No perfusion maps were generated. IMPRESSION: 1. Negative CTA for emergent large vessel occlusion. 2. Mild atheromatous change about the major arterial vasculature of the head and neck as above. No  proximal high-grade or correctable stenosis. 3. 2 mm focal outpouching arising from the proximal cavernous right ICA, which could reflect a small aneurysm versus vascular infundibulum. Attention at follow-up recommended. 4. CT perfusion portion of this exam unfortunately failed due to technical difficulty. No perfusion maps were generated. Critical Value/emergent results were called by telephone at the time of interpretation on 05/05/2021 at 8:15 pm to provider ERIC [facility], who verbally acknowledged these results. Electronically Signed   By: Jeannine Boga M.D.   On: 05/05/2021 21:04   DG Chest Port 1 View  Result Date: 05/05/2021 CLINICAL DATA:  Shortness of breath EXAM: PORTABLE CHEST 1 VIEW COMPARISON:  05/03/2021 FINDINGS: Cardiac shadow is mildly enlarged but stable. Right jugular central line is again seen deep in the right atrium but stable. Lungs are well aerated bilaterally with patchy stable atelectasis/scarring on the left. No acute abnormality is seen. IMPRESSION: No acute abnormality noted. Stable changes in the left base are seen. Electronically Signed   By: Inez Catalina M.D.   On: 05/05/2021 19:08    Impression/Recommendations Principal Problem:   Cerebrovascular accident (CVA) of right basal ganglia (HCC) Active Problems:   ESRD on dialysis ([place])   Bandemia  Acute onset of left-sided weakness with concern for recurrent stroke Rapid response and code stroke were called the evening of 05/05/2021 due to confusion and restlessness after dialysis.  Seen by neurology/stroke team, Suspects most likely etiology for patient's acute neurological worsening is watershed TIA/infarction of the right cerebral hemisphere. Maintain SBP greater than 120 as recommended by neurology.  Hold off home oral antihypertensives. Continue DAPT, aspirin, Plavix, and Crestor 20 mg daily as recommended by neurology. PT OT therapy/aspiration, fall precautions. Neurochecks every 4 hours  Type 2 diabetes  with hyperglycemia Hemoglobin A1c 5.2 on 04/16/2021. Continue insulin sliding scale  Ambulatory dysfunction post stroke Continue PT OT Fall and aspiration precautions.  Thank you for this consultation.  Our [facility] hospitalist team will follow the patient with you.   Time Spent: 35 minutes  Kayleen Memos M.D. [facility] 05/06/2021, 1:38 AM

## 2021-05-06 NOTE — Progress Notes (Signed)
Inpatient Rehabilitation Care Coordinator Discharge Note  The overall goal for the admission was met for:   Discharge location: Yes. D/c to acute hospital due to medical reasons.   Length of Stay: Yes. 15 days.   Discharge activity level: Yes. Mod A  Home/community participation: Yes. Limited.   Services provided included: MD, RD, PT, OT, SLP, RN, CM, TR, Pharmacy, Neuropsych, and SW  Financial Services: Medicare, Private Insurance: BCBS, and Other: BCBS  Choices offered to/list presented to:Yes  Follow-up services arranged: Home Health: [facility] (established when pt was in acute hospital) and DME: TBD and will need to determine what needs to be ordered  Comments (or additional information): Husband was working on establishing 24/7 care for pt  Patient/Family verbalized understanding of follow-up arrangements: Yes  Individual responsible for coordination of the follow-up plan: Husband Dr. [name] [phone]  Confirmed correct DME delivered: Rana Snare 05/06/2021    Rana Snare

## 2021-05-06 NOTE — Progress Notes (Signed)
OT Cancellation Note  Patient Details Name: Carolyn Russo MRN: [id number] DOB: Jan 30, 1951   Cancelled Treatment:    Reason Eval/Treat Not Completed: Other (comment). Pt from CIR and plan is to return to CIR. Will defer OT eval to CIR.  Ramond Dial, OT/L   Acute OT Clinical Specialist Acute Rehabilitation Services Pager [phone] Office [phone]  05/06/2021, 2:07 PM

## 2021-05-06 NOTE — Evaluation (Signed)
Clinical/Bedside Swallow Evaluation Patient Details  Name: Carolyn Russo MRN: [id number] Date of Birth: Aug 06, 1951  Today's Date: 05/06/2021 Time: SLP Start Time (ACUTE ONLY): 23 SLP Stop Time (ACUTE ONLY): 7341 SLP Time Calculation (min) (ACUTE ONLY): 15 min  Past Medical History:  Past Medical History:  Diagnosis Date   Acute GI bleeding    Allergy    Anemia    Anterior chest wall pain    Appendicitis 1965   Asthma    Body mass index 37.0-37.9, adult    Breast pain    Cataract    both eyes   CHF (congestive heart failure) ([place])    Chronic kidney disease    stage 5 - on dialysis   Cognitive change 04/20/2021   r/t cva 03/2821   Dehydration 2014   Deviated septum 1971   Diabetes mellitus    no meds   Dyspnea 2014   Extrinsic asthma    WITH ASTHMA ATTACK   Fibroid 1980   GERD (gastroesophageal reflux disease)    Heart murmur    Hx gestational diabetes    Hyperlipidemia    Hypertension 2014   Inguinal hernia 1959   Malaise and fatigue 2014   Non-IgE mediated allergic asthma 2014   Obesity    Pelvic pain    Pregnancy, high-risk 1985   Stroke ([place]) 04/20/2021   (CVA) of right basal ganglia   Tonsillitis 1968   Uterine fibroid 1980   Past Surgical History:  Past Surgical History:  Procedure Laterality Date   APPENDECTOMY  9379   BASCILIC VEIN TRANSPOSITION Right 04/30/2021   Procedure: RIGHT FIRST STAGE [place];  Surgeon: Angelia Mould, MD;  Location: [facility] OR;  Service: Vascular;  Laterality: Right;   CESAREAN SECTION  1985   COLONOSCOPY     ESOPHAGOGASTRODUODENOSCOPY (EGD) WITH PROPOFOL N/A 04/18/2021   Procedure: ESOPHAGOGASTRODUODENOSCOPY (EGD) WITH PROPOFOL;  Surgeon: Gatha Mayer, MD;  Location: [facility] ENDOSCOPY;  Service: Endoscopy;  Laterality: N/A;   EYE SURGERY     bilateral cataract    FLEXIBLE SIGMOIDOSCOPY N/A 04/18/2021   Procedure: FLEXIBLE SIGMOIDOSCOPY;  Surgeon: Gatha Mayer, MD;  Location: [facility] ENDOSCOPY;  Service:  Endoscopy;  Laterality: N/A;   HERNIA REPAIR  1959   IR FLUORO GUIDE CV LINE RIGHT  03/18/2021   IR US GUIDE VASC ACCESS RIGHT  03/18/2021   LEFT HEART CATH AND CORONARY ANGIOGRAPHY N/A 04/04/2017   Procedure: Left Heart Cath and Coronary Angiography;  Surgeon: Belva Crome, MD;  Location: [place] CV LAB;  Service: Cardiovascular;  Laterality: N/A;   MYOMECTOMY  1980, 2004, 2007   RHINOPLASTY  1971   ROTATOR CUFF REPAIR  2003   SURGICAL REPAIR OF HEMORRHAGE  2015   TONSILLECTOMY  1968   HPI:  Pt is a 70 y/o female transferred from [place] on 7/13 due to AMS. CT head with no acute abnormalities and similar appearance of old infarct lateral to R thalamus. Recent R basal ganglia CVA on 04/16/21. MRI brain 7/14: No acute infarction. Evolving prior infarction of the right corona  radiata and basal ganglia. PMH: diabetes, hypertension, hyperlipidemia, ESRD on HD. Pt was receiving cognitive-linguistic and dysphagia treatment at CIR and was on a dysphagia 2 diet with thin liquids.   Assessment / Plan / Recommendation Clinical Impression  Pt was seen for bedside swallow evaluation. She was alert and cooperative during the evaluation and reported that her dysphagia 1 lunch was "delicious". Oral mechanism exam was significant for reduced  left-sided lingual and labial strength and ROM and reduced left-sided velar elevation. Dentition was natural and adequate. She tolerated all solids and liquids without signs or symptoms of aspiration. Mastication and oral clearance was [facility] for dysphagia 3 solids, but with a munching masticatory pattern. Pt's diet will be advanced from dysphagia 1 to dysphagia 3 solids with thin liquids. SLP will follow to assess diet tolerance. SLP Visit Diagnosis: Dysphagia, unspecified (R13.10)    Aspiration Risk  Moderate aspiration risk    Diet Recommendation Dysphagia 3 (Mech soft);Thin liquid   Liquid Administration via: Cup;Straw Medication Administration: Whole meds with  puree Supervision: Staff to assist with self feeding Compensations: Slow rate;Small sips/bites    Other  Recommendations Oral Care Recommendations: Oral care BID   Follow up Recommendations Home health SLP      Frequency and Duration min 2x/week  1 week       Prognosis Prognosis for Safe Diet Advancement: Good      Swallow Study   General Date of Onset: 03/27/21 HPI: Pt is a 70 y/o female transferred from [place] on 7/13 due to AMS. CT head with no acute abnormalities and similar appearance of old infarct lateral to R thalamus. Recent R basal ganglia CVA on 04/16/21. MRI brain 7/14: No acute infarction. Evolving prior infarction of the right corona  radiata and basal ganglia. PMH: diabetes, hypertension, hyperlipidemia, ESRD on HD. Pt was receiving cognitive-linguistic and dysphagia treatment at CIR and was on a dysphagia 2 diet with thin liquids. Type of Study: Bedside Swallow Evaluation Previous Swallow Assessment: See HPI Diet Prior to this Study: Dysphagia 1 (puree);Thin liquids Temperature Spikes Noted: No Respiratory Status: Room air History of Recent Intubation: No Behavior/Cognition: Alert;Cooperative;Pleasant mood Oral Cavity Assessment: Within Functional Limits Oral Care Completed by SLP: No Oral Cavity - Dentition: Adequate natural dentition Vision: Functional for self-feeding Self-Feeding Abilities: Needs assist Patient Positioning: Upright in bed;Postural control adequate for testing Baseline Vocal Quality: Normal Volitional Cough: Weak Volitional Swallow: Able to elicit    Oral/Motor/Sensory Function Overall Oral Motor/Sensory Function: Mild impairment Facial ROM: Reduced left Facial Symmetry: Abnormal symmetry left;Suspected CN VII (facial) dysfunction Facial Strength: Reduced right;Suspected CN VII (facial) dysfunction Facial Sensation: Within Functional Limits Lingual ROM: Suspected CN XII (hypoglossal) dysfunction;Reduced left Lingual Symmetry: Abnormal  symmetry left;Suspected CN XII (hypoglossal) dysfunction Lingual Strength: Reduced;Suspected CN XII (hypoglossal) dysfunction Velum: Impaired left;Suspected CN X (Vagus) dysfunction   Ice Chips Ice chips: Within functional limits Presentation: Spoon   Thin Liquid Thin Liquid: Within functional limits Presentation: Cup;Straw    Nectar Thick Nectar Thick Liquid: Not tested   Honey Thick Honey Thick Liquid: Not tested   Puree Puree: Within functional limits Presentation: Spoon   Solid     Solid: Within functional limits Presentation: Demetrius Charity I. Hardin Negus, [place], [place] Office number [phone] Pager [phone]  Horton Marshall 05/06/2021,4:30 PM

## 2021-05-06 NOTE — Progress Notes (Signed)
SLP Cancellation Note  Patient Details Name: Carolyn Russo MRN: [id number] DOB: 07-15-1951   Cancelled treatment:       Reason Eval/Treat Not Completed: Patient at procedure or test/unavailable (Pt currently having EEG completed. SLP will follow up later today as schedule allows.)  Lallie Strahm I. Hardin Negus, [place], [place] Office number [phone] Pager [place] 05/06/2021, 3:28 PM

## 2021-05-06 NOTE — Evaluation (Addendum)
Speech Language Pathology Evaluation Patient Details Name: Carolyn Russo MRN: [id number] DOB: 1951-09-06 Today's Date: 05/06/2021 Time: 5701-7793 SLP Time Calculation (min) (ACUTE ONLY): 27 min  Problem List:  Patient Active Problem List   Diagnosis Date Noted   Stroke-like symptoms 05/06/2021   Bandemia    Cerebrovascular accident (CVA) of right basal ganglia ([place]) 04/20/2021   External hemorrhoids    CVA (cerebral vascular accident) ([place]) 04/16/2021   ESRD on dialysis ([place]) 04/16/2021   Hypertensive urgency 03/10/2021   Hilar enlargement    Anemia of chronic disease    Chronic diastolic CHF (congestive heart failure) ([place]) 03/09/2021   CKD (chronic kidney disease), stage V ([place]) 03/08/2021   Diabetic retinopathy ([place]) 03/08/2021   Hyperglycemia due to type 2 diabetes mellitus ([place]) 03/08/2021   Pure hypercholesterolemia 03/08/2021   Anemia in chronic kidney disease 09/24/2020   Uncontrolled type 2 diabetes mellitus with hyperglycemia ([place]) 03/19/2019   Osteopenia 09/04/2018   Migraine 09/04/2018   Asthma 09/04/2018   Pseudophakia of both eyes 07/17/2018   Pseudophakia of left eye 07/03/2018   Open angle with borderline findings and high glaucoma risk in left eye 07/03/2018   Age-related nuclear cataract of right eye 07/03/2018   CAD (coronary artery disease), native coronary artery 04/27/2017   Abnormal nuclear stress test 04/04/2017   Dyspnea on exertion 03/09/2017   Appendicitis    Age-related hypermature cataract of both eyes 12/20/2016   Uterine leiomyoma 11/27/2012   Dyslipidemia (high LDL; low HDL) 11/25/2011   Obesity (BMI 30-39.9) 11/25/2011   Hypertensive disorder 11/21/2011   Type 2 diabetes mellitus ([place]) 11/21/2011   Abnormal EKG 11/21/2011   Past Medical History:  Past Medical History:  Diagnosis Date   Acute GI bleeding    Allergy    Anemia    Anterior chest wall pain    Appendicitis 1965   Asthma    Body mass index 37.0-37.9, adult    Breast  pain    Cataract    both eyes   CHF (congestive heart failure) ([place])    Chronic kidney disease    stage 5 - on dialysis   Cognitive change 04/20/2021   r/t cva 03/2821   Dehydration 2014   Deviated septum 1971   Diabetes mellitus    no meds   Dyspnea 2014   Extrinsic asthma    WITH ASTHMA ATTACK   Fibroid 1980   GERD (gastroesophageal reflux disease)    Heart murmur    Hx gestational diabetes    Hyperlipidemia    Hypertension 2014   Inguinal hernia 1959   Malaise and fatigue 2014   Non-IgE mediated allergic asthma 2014   Obesity    Pelvic pain    Pregnancy, high-risk 1985   Stroke ([place]) 04/20/2021   (CVA) of right basal ganglia   Tonsillitis 1968   Uterine fibroid 1980   Past Surgical History:  Past Surgical History:  Procedure Laterality Date   APPENDECTOMY  9030   BASCILIC VEIN TRANSPOSITION Right 04/30/2021   Procedure: RIGHT FIRST STAGE [place];  Surgeon: Angelia Mould, MD;  Location: [facility] OR;  Service: Vascular;  Laterality: Right;   CESAREAN SECTION  1985   COLONOSCOPY     ESOPHAGOGASTRODUODENOSCOPY (EGD) WITH PROPOFOL N/A 04/18/2021   Procedure: ESOPHAGOGASTRODUODENOSCOPY (EGD) WITH PROPOFOL;  Surgeon: Gatha Mayer, MD;  Location: [facility] ENDOSCOPY;  Service: Endoscopy;  Laterality: N/A;   EYE SURGERY     bilateral cataract    FLEXIBLE SIGMOIDOSCOPY N/A 04/18/2021  Procedure: FLEXIBLE SIGMOIDOSCOPY;  Surgeon: Gatha Mayer, MD;  Location: [facility] ENDOSCOPY;  Service: Endoscopy;  Laterality: N/A;   HERNIA REPAIR  1959   IR FLUORO GUIDE CV LINE RIGHT  03/18/2021   IR US GUIDE VASC ACCESS RIGHT  03/18/2021   LEFT HEART CATH AND CORONARY ANGIOGRAPHY N/A 04/04/2017   Procedure: Left Heart Cath and Coronary Angiography;  Surgeon: Belva Crome, MD;  Location: [place] CV LAB;  Service: Cardiovascular;  Laterality: N/A;   MYOMECTOMY  1980, 2004, 2007   RHINOPLASTY  1971   ROTATOR CUFF REPAIR  2003   SURGICAL REPAIR OF HEMORRHAGE  2015    TONSILLECTOMY  1968   HPI:  Pt is a 70 y/o female transferred from [place] on 7/13 due to AMS. CT head with no acute abnormalities and similar appearance of old infarct lateral to R thalamus. Recent R basal ganglia CVA on 04/16/21. MRI brain 7/14: No acute infarction. Evolving prior infarction of the right corona  radiata and basal ganglia. PMH: diabetes, hypertension, hyperlipidemia, ESRD on HD. Pt was receiving cognitive-linguistic and dysphagia treatment at CIR and was on a dysphagia 2 diet with thin liquids. Speech/language/cognition evaluation on 6/27 revealed mild cognitive impairment   Assessment / Plan / Recommendation Clinical Impression  Pt participated in speech/language/cognition evaluation. 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 Her husband was contacted via phone and he denied any acute changes in cognition/speech since this admission from CIR. However, he stated that the pt appears to be cognitively worse than during her prior acute admission. The [facility] Mental Status Examination was completed to evaluate the pt's cognitive-linguistic skills. She achieved a score of 5/30 which is below the normal limits of 27 or more out of 30 and is notably worse than when she was last seen by the acute SLP on 6/27. She exhibited difficulty in the areas of awareness, attention, memory, complex problem solving, and executive function. Mild dysarthria was also noted, but speech was adequately intelligible. Cognitive-linguistic treatment is recommended at the pt's next level of care.    SLP Assessment  SLP Recommendation/Assessment: All further Speech Lanaguage Pathology  needs can be addressed in the next venue of care SLP Visit Diagnosis: Cognitive communication deficit (R41.841);Dysarthria and anarthria (R47.1)    Follow Up Recommendations  Home health SLP    Frequency and Duration min 2x/week         SLP Evaluation Cognition  Overall Cognitive Status:  Impaired/Different from baseline Arousal/Alertness: Awake/alert Orientation Level: Oriented to place;Oriented to person;Oriented to situation;Disoriented to time Attention: Focused;Sustained Focused Attention: Appears intact Sustained Attention: Impaired Sustained Attention Impairment: Verbal complex Memory: Impaired Memory Impairment: Retrieval deficit;Decreased recall of new information Decreased Short Term Memory: Verbal complex (Immediate: 5/5; delayed: 0/5; with cues: 3/5) Awareness: Impaired Awareness Impairment: Emergent impairment Problem Solving: Impaired Executive Function: Reasoning       Comprehension  Auditory Comprehension Overall Auditory Comprehension: Appears within functional limits for tasks assessed Yes/No Questions: Within Functional Limits Commands: Within Functional Limits Conversation: Simple    Expression Expression Primary Mode of Expression: Verbal Verbal Expression Overall Verbal Expression: Appears within functional limits for tasks assessed Initiation: No impairment Repetition: No impairment Naming: No impairment   Oral / Motor  Oral Motor/Sensory Function Overall Oral Motor/Sensory Function: Mild impairment Facial ROM: Reduced left Facial Symmetry: Abnormal symmetry left;Suspected CN VII (facial) dysfunction Facial Strength: Reduced right;Suspected CN VII (facial) dysfunction Facial Sensation: Within Functional Limits Lingual ROM: Suspected CN XII (hypoglossal) dysfunction;Reduced  left Lingual Symmetry: Abnormal symmetry left;Suspected CN XII (hypoglossal) dysfunction Lingual Strength: Reduced;Suspected CN XII (hypoglossal) dysfunction Velum: Impaired left;Suspected CN X (Vagus) dysfunction Mandible: Within Functional Limits Motor Speech Overall Motor Speech: Impaired at baseline Respiration: Within functional limits Phonation: Normal Resonance: Within functional limits Articulation: Impaired Level of Impairment:  Conversation Intelligibility: Intelligible Motor Planning: Witnin functional limits Motor Speech Errors: Aware   Yeriel Mineo I. Hardin Negus, [employer], [place] Office number [phone] Pager [place] 05/06/2021, 5:11 PM

## 2021-05-06 NOTE — Progress Notes (Addendum)
[facility] Progress Note   Subjective:   Patient seen and examined at bedside. CODE STROKE called yesterday post HD due to change in mental status.  Patient has signed off early the last 4 HD.  Nurse during HD yesterday reported patient very anxious during treatment, offered meds but declined.  Reports feeling better today. States "I was out of it yesterday."  Denies CP, SOB, headache, confusion, abdominal pain, weakness and fatigue. Hoping to go back to CIR today.   Objective Vitals:   05/06/21 0650 05/06/21 0841 05/06/21 0850 05/06/21 1118  BP: (!) 146/70 133/63  (!) 143/72  Pulse: 72 79  80  Resp: 18 18  18   Temp: 98.6 F (37 C) 98.9 F (37.2 C)  98.2 F (36.8 C)  TempSrc: Oral Oral  Oral  SpO2:  100% 99% 100%   Physical Exam General:well appearing female in NAD Heart:RRR, no mrg Lungs:CTAB, nml WOB Abdomen:soft, NTND Extremities:no LE edema Dialysis Access: TDC, RU AVF maturing +b/t   There were no vitals filed for this visit. No intake or output data in the 24 hours ending 05/06/21 1332  Additional Objective Labs: Basic Metabolic Panel: Recent Labs  Lab 05/01/21 0553 05/03/21 1044 05/05/21 1131  NA 132* 131* 132*  K 4.2 4.8 4.2  CL 98 97* 94*  CO2 27 25 30   GLUCOSE 163* 306* 123*  BUN 23 21 14   CREATININE 5.50* 5.90* 5.78*  CALCIUM 8.8* 8.6* 9.5  PHOS 4.7* 4.8* 5.1*   Liver Function Tests: Recent Labs  Lab 05/01/21 0553 05/03/21 1044 05/05/21 1131  ALBUMIN 2.9* 2.8* 3.3*   CBC: Recent Labs  Lab 04/30/21 0508 05/01/21 0553 05/03/21 1044 05/05/21 1131 05/06/21 0456  WBC 14.4* 13.0* 12.8* 10.1 8.8  NEUTROABS  --  6.9 7.9*  --   --   HGB 9.9* 9.8* 9.7* 11.1* 10.6*  HCT 28.3* 28.4* 29.5* 33.2* 31.0*  MCV 84.2 86.1 88.6 87.4 88.6  PLT 254 238 299 391 235   CBG: Recent Labs  Lab 05/05/21 1841 05/05/21 2106 05/06/21 0605 05/06/21 0654 05/06/21 1111  GLUCAP 152* 126* 82 72 82    Studies/Results: CT ANGIO HEAD W OR WO  CONTRAST  Result Date: 05/05/2021 CLINICAL DATA:  Initial evaluation for acute stroke, TIA. EXAM: CT ANGIOGRAPHY HEAD AND NECK CT PERFUSION BRAIN TECHNIQUE: Multidetector CT imaging of the head and neck was performed using the standard protocol during bolus administration of intravenous contrast. Multiplanar CT image reconstructions and MIPs were obtained to evaluate the vascular anatomy. Carotid stenosis measurements (when applicable) are obtained utilizing NASCET criteria, using the distal internal carotid diameter as the denominator. Multiphase CT imaging of the brain was performed following IV bolus contrast injection. Subsequent parametric perfusion maps were calculated using RAPID software. CONTRAST:  134mL OMNIPAQUE IOHEXOL 350 MG/ML SOLN COMPARISON:  Prior head CT from earlier the same day as well as previous MRA from 04/17/2021. FINDINGS: CTA NECK FINDINGS Aortic arch: Visualized aortic arch normal in caliber with normal 3 vessel morphology. No hemodynamically significant stenosis seen about the origin of the great vessels. Right carotid system: Right common and internal carotid arteries patent without stenosis, dissection or occlusion. Minimal atheromatous plaque about the proximal cervical right ICA without stenosis. Left carotid system: Left common and internal carotid arteries patent without stenosis, dissection or occlusion. Vertebral arteries: Both vertebral arteries arise from subclavian arteries. No proximal subclavian artery stenosis. Vertebral arteries diminutive but widely patent within the neck without stenosis, dissection or occlusion. Skeleton: No visible acute  osseous finding. No discrete or worrisome osseous lesions. Other neck: No other acute soft tissue abnormality within the neck. 7 mm right thyroid nodule noted, of doubtful significance given size and patient age, no follow-up imaging recommended (ref: J Am Coll Radiol. 2015 Feb;12(2): 143-50). Right-sided hemodialysis catheter in  place. Upper chest: Visualized upper chest demonstrates no acute finding. Review of the MIP images confirms the above findings CTA HEAD FINDINGS Anterior circulation: Petrous segments patent bilaterally. Mild atheromatous change within the carotid siphons without hemodynamically significant stenosis. No made of a subtle 2 mm focal outpouching arising from the proximal cavernous right ICA, which could reflect a small aneurysm versus vascular infundibulum (series 7, image 254). A1 segments patent bilaterally. Normal anterior communicating artery complex. Anterior cerebral arteries patent to their distal aspects without stenosis. No M1 stenosis or occlusion. Normal MCA bifurcations. Distal MCA branches well perfused and symmetric. Posterior circulation: Both V4 segments patent to the vertebrobasilar junction without stenosis. Both PICA origins patent and normal. Basilar patent to its distal aspect without stenosis. Superior cerebellar arteries patent bilaterally. Both PCA supplied via the basilar as well as prominent bilateral posterior communicating arteries. Atheromatous irregularity within the bilateral P2 segments with associated mild to moderate multifocal narrowing. PCAs remain patent to their distal aspects. Venous sinuses: Grossly patent allowing for timing the contrast bolus. Anatomic variants: None significant. Review of the MIP images confirms the above findings CT Brain Perfusion Findings: ASPECTS: Not calculated. The CT perfusion portion of this exam unfortunately failed due to technical difficulty. No perfusion maps were generated. IMPRESSION: 1. Negative CTA for emergent large vessel occlusion. 2. Mild atheromatous change about the major arterial vasculature of the head and neck as above. No proximal high-grade or correctable stenosis. 3. 2 mm focal outpouching arising from the proximal cavernous right ICA, which could reflect a small aneurysm versus vascular infundibulum. Attention at follow-up  recommended. 4. CT perfusion portion of this exam unfortunately failed due to technical difficulty. No perfusion maps were generated. Critical Value/emergent results were called by telephone at the time of interpretation on 05/05/2021 at 8:15 pm to provider ERIC [facility], who verbally acknowledged these results. Electronically Signed   By: Jeannine Boga M.D.   On: 05/05/2021 21:04   CT HEAD WO CONTRAST  Result Date: 05/05/2021 CLINICAL DATA:  70 year old female with neurologic deficit. EXAM: CT HEAD WITHOUT CONTRAST TECHNIQUE: Contiguous axial images were obtained from the base of the skull through the vertex without intravenous contrast. COMPARISON:  Head CT dated 05/03/2021. FINDINGS: Brain: Mild age-related atrophy and moderate chronic microvascular ischemic changes. An area of infarct again noted lateral to the right thalamus, seen on the prior CT. There is no acute intracranial hemorrhage. No mass effect or midline shift. No extra-axial fluid collection. Vascular: No hyperdense vessel or unexpected calcification. Skull: Normal. Negative for fracture or focal lesion. Sinuses/Orbits: The visualized paranasal sinuses and mastoid air cells are clear. Bilateral maxillary sinus surgeries. Other: None IMPRESSION: 1. No acute intracranial pathology. 2. Age-related atrophy and chronic microvascular ischemic changes. Similar appearance of focal area of old infarct lateral to the right thalamus. Electronically Signed   By: Anner Crete M.D.   On: 05/05/2021 19:56   CT ANGIO NECK W OR WO CONTRAST  Result Date: 05/05/2021 CLINICAL DATA:  Initial evaluation for acute stroke, TIA. EXAM: CT ANGIOGRAPHY HEAD AND NECK CT PERFUSION BRAIN TECHNIQUE: Multidetector CT imaging of the head and neck was performed using the standard protocol during bolus administration of intravenous contrast. Multiplanar CT image reconstructions  and MIPs were obtained to evaluate the vascular anatomy. Carotid stenosis measurements  (when applicable) are obtained utilizing NASCET criteria, using the distal internal carotid diameter as the denominator. Multiphase CT imaging of the brain was performed following IV bolus contrast injection. Subsequent parametric perfusion maps were calculated using RAPID software. CONTRAST:  156mL OMNIPAQUE IOHEXOL 350 MG/ML SOLN COMPARISON:  Prior head CT from earlier the same day as well as previous MRA from 04/17/2021. FINDINGS: CTA NECK FINDINGS Aortic arch: Visualized aortic arch normal in caliber with normal 3 vessel morphology. No hemodynamically significant stenosis seen about the origin of the great vessels. Right carotid system: Right common and internal carotid arteries patent without stenosis, dissection or occlusion. Minimal atheromatous plaque about the proximal cervical right ICA without stenosis. Left carotid system: Left common and internal carotid arteries patent without stenosis, dissection or occlusion. Vertebral arteries: Both vertebral arteries arise from subclavian arteries. No proximal subclavian artery stenosis. Vertebral arteries diminutive but widely patent within the neck without stenosis, dissection or occlusion. Skeleton: No visible acute osseous finding. No discrete or worrisome osseous lesions. Other neck: No other acute soft tissue abnormality within the neck. 7 mm right thyroid nodule noted, of doubtful significance given size and patient age, no follow-up imaging recommended (ref: J Am Coll Radiol. 2015 Feb;12(2): 143-50). Right-sided hemodialysis catheter in place. Upper chest: Visualized upper chest demonstrates no acute finding. Review of the MIP images confirms the above findings CTA HEAD FINDINGS Anterior circulation: Petrous segments patent bilaterally. Mild atheromatous change within the carotid siphons without hemodynamically significant stenosis. No made of a subtle 2 mm focal outpouching arising from the proximal cavernous right ICA, which could reflect a small aneurysm  versus vascular infundibulum (series 7, image 254). A1 segments patent bilaterally. Normal anterior communicating artery complex. Anterior cerebral arteries patent to their distal aspects without stenosis. No M1 stenosis or occlusion. Normal MCA bifurcations. Distal MCA branches well perfused and symmetric. Posterior circulation: Both V4 segments patent to the vertebrobasilar junction without stenosis. Both PICA origins patent and normal. Basilar patent to its distal aspect without stenosis. Superior cerebellar arteries patent bilaterally. Both PCA supplied via the basilar as well as prominent bilateral posterior communicating arteries. Atheromatous irregularity within the bilateral P2 segments with associated mild to moderate multifocal narrowing. PCAs remain patent to their distal aspects. Venous sinuses: Grossly patent allowing for timing the contrast bolus. Anatomic variants: None significant. Review of the MIP images confirms the above findings CT Brain Perfusion Findings: ASPECTS: Not calculated. The CT perfusion portion of this exam unfortunately failed due to technical difficulty. No perfusion maps were generated. IMPRESSION: 1. Negative CTA for emergent large vessel occlusion. 2. Mild atheromatous change about the major arterial vasculature of the head and neck as above. No proximal high-grade or correctable stenosis. 3. 2 mm focal outpouching arising from the proximal cavernous right ICA, which could reflect a small aneurysm versus vascular infundibulum. Attention at follow-up recommended. 4. CT perfusion portion of this exam unfortunately failed due to technical difficulty. No perfusion maps were generated. Critical Value/emergent results were called by telephone at the time of interpretation on 05/05/2021 at 8:15 pm to provider ERIC [facility], who verbally acknowledged these results. Electronically Signed   By: Jeannine Boga M.D.   On: 05/05/2021 21:04   MR BRAIN WO CONTRAST  Result Date:  05/06/2021 CLINICAL DATA:  Left-sided weakness EXAM: MRI HEAD WITHOUT CONTRAST TECHNIQUE: Multiplanar, multiecho pulse sequences of the brain and surrounding structures were obtained without intravenous contrast. COMPARISON:  04/16/2021 FINDINGS: Significant  motion artifact is present. Brain: There is persistent diffusion hyperintensity with decreased ADC hypointensity involving the right corona radiata and basal ganglia. No new abnormal diffusion. There is no acute infarction or intracranial hemorrhage. There is no intracranial mass, mass effect, or edema. There is no hydrocephalus or extra-axial fluid collection. Prominence of the ventricles and sulci reflects stable parenchymal volume loss. Patchy and confluent areas of T2 hyperintensity in the supratentorial and pontine white matter are nonspecific but probably reflects stable chronic microvascular ischemic changes. Very small chronic right cerebellar infarct is noted. Vascular: Major vessel flow voids at the skull base are preserved. Skull and upper cervical spine: Normal marrow signal is grossly preserved. Sinuses/Orbits: Paranasal sinuses are aerated. Bilateral lens replacements. Other: Sella is unremarkable.  Mastoid air cells are clear. IMPRESSION: No acute infarction. Evolving prior infarction of the right corona radiata and basal ganglia. Chronic microvascular ischemic changes. Electronically Signed   By: Macy Mis M.D.   On: 05/06/2021 10:40   CT CEREBRAL PERFUSION W CONTRAST  Result Date: 05/05/2021 CLINICAL DATA:  Initial evaluation for acute stroke, TIA. EXAM: CT ANGIOGRAPHY HEAD AND NECK CT PERFUSION BRAIN TECHNIQUE: Multidetector CT imaging of the head and neck was performed using the standard protocol during bolus administration of intravenous contrast. Multiplanar CT image reconstructions and MIPs were obtained to evaluate the vascular anatomy. Carotid stenosis measurements (when applicable) are obtained utilizing NASCET criteria, using  the distal internal carotid diameter as the denominator. Multiphase CT imaging of the brain was performed following IV bolus contrast injection. Subsequent parametric perfusion maps were calculated using RAPID software. CONTRAST:  122mL OMNIPAQUE IOHEXOL 350 MG/ML SOLN COMPARISON:  Prior head CT from earlier the same day as well as previous MRA from 04/17/2021. FINDINGS: CTA NECK FINDINGS Aortic arch: Visualized aortic arch normal in caliber with normal 3 vessel morphology. No hemodynamically significant stenosis seen about the origin of the great vessels. Right carotid system: Right common and internal carotid arteries patent without stenosis, dissection or occlusion. Minimal atheromatous plaque about the proximal cervical right ICA without stenosis. Left carotid system: Left common and internal carotid arteries patent without stenosis, dissection or occlusion. Vertebral arteries: Both vertebral arteries arise from subclavian arteries. No proximal subclavian artery stenosis. Vertebral arteries diminutive but widely patent within the neck without stenosis, dissection or occlusion. Skeleton: No visible acute osseous finding. No discrete or worrisome osseous lesions. Other neck: No other acute soft tissue abnormality within the neck. 7 mm right thyroid nodule noted, of doubtful significance given size and patient age, no follow-up imaging recommended (ref: J Am Coll Radiol. 2015 Feb;12(2): 143-50). Right-sided hemodialysis catheter in place. Upper chest: Visualized upper chest demonstrates no acute finding. Review of the MIP images confirms the above findings CTA HEAD FINDINGS Anterior circulation: Petrous segments patent bilaterally. Mild atheromatous change within the carotid siphons without hemodynamically significant stenosis. No made of a subtle 2 mm focal outpouching arising from the proximal cavernous right ICA, which could reflect a small aneurysm versus vascular infundibulum (series 7, image 254). A1 segments  patent bilaterally. Normal anterior communicating artery complex. Anterior cerebral arteries patent to their distal aspects without stenosis. No M1 stenosis or occlusion. Normal MCA bifurcations. Distal MCA branches well perfused and symmetric. Posterior circulation: Both V4 segments patent to the vertebrobasilar junction without stenosis. Both PICA origins patent and normal. Basilar patent to its distal aspect without stenosis. Superior cerebellar arteries patent bilaterally. Both PCA supplied via the basilar as well as prominent bilateral posterior communicating arteries. Atheromatous irregularity within the  bilateral P2 segments with associated mild to moderate multifocal narrowing. PCAs remain patent to their distal aspects. Venous sinuses: Grossly patent allowing for timing the contrast bolus. Anatomic variants: None significant. Review of the MIP images confirms the above findings CT Brain Perfusion Findings: ASPECTS: Not calculated. The CT perfusion portion of this exam unfortunately failed due to technical difficulty. No perfusion maps were generated. IMPRESSION: 1. Negative CTA for emergent large vessel occlusion. 2. Mild atheromatous change about the major arterial vasculature of the head and neck as above. No proximal high-grade or correctable stenosis. 3. 2 mm focal outpouching arising from the proximal cavernous right ICA, which could reflect a small aneurysm versus vascular infundibulum. Attention at follow-up recommended. 4. CT perfusion portion of this exam unfortunately failed due to technical difficulty. No perfusion maps were generated. Critical Value/emergent results were called by telephone at the time of interpretation on 05/05/2021 at 8:15 pm to provider ERIC [facility], who verbally acknowledged these results. Electronically Signed   By: Jeannine Boga M.D.   On: 05/05/2021 21:04   DG Chest Port 1 View  Result Date: 05/05/2021 CLINICAL DATA:  Shortness of breath EXAM: PORTABLE CHEST 1  VIEW COMPARISON:  05/03/2021 FINDINGS: Cardiac shadow is mildly enlarged but stable. Right jugular central line is again seen deep in the right atrium but stable. Lungs are well aerated bilaterally with patchy stable atelectasis/scarring on the left. No acute abnormality is seen. IMPRESSION: No acute abnormality noted. Stable changes in the left base are seen. Electronically Signed   By: Inez Catalina M.D.   On: 05/05/2021 19:08    Medications:   aspirin EC  81 mg Oral Daily   B-complex with vitamin C  1 tablet Oral Daily   cholecalciferol  2,000 Units Oral Daily   clopidogrel  75 mg Oral Daily   heparin  5,000 Units Subcutaneous Q8H   insulin aspart  0-5 Units Subcutaneous QHS   insulin aspart  0-6 Units Subcutaneous TID WC   mometasone-formoterol  2 puff Inhalation BID   pantoprazole  40 mg Oral Daily   rosuvastatin  20 mg Oral Daily   sevelamer carbonate  800 mg Oral TID WC    Dialysis Orders: [facility]-MWF 4 hrs 180NRe 400/Autoflow 1.5 86.5 kg 2.0K/2.0 Ca TDC -heparin 2000 units IV TIW -Mircera 100 mcg IV q 2 weeks (last dose 04/14/2021 -Hectorol 5 mcg IV TIW   Assessment/Plan: Acute subcortical R CVA-per Primary/Neurology. Admitted back to hospital yesterday due to AMS/stroke like symptoms.  Neurology reassessed and determined no acute stroke.  Recommend return to CIR ASAP.  Acute LGIB- Hx hemorrhoidal bleeding in past. new onset 06/25 post clopidogrel load. s/p 2 units PRBCS on 04/18/21.  Clopidogrel and ASA on hold. No heparin with HD. Per GI: EGD and flex sig with no etiology and bleeding scan (-).  Hgb today 10.6 ESRD -  MWF schedule as an outpatient.  Off heparin d/t new GIB. 1st stage R basilic vein transposition on 7/8 by Dr. Scot Dock.  Signing off early, encouraged compliance.  AMS following HD yesterday.  Will lower goal for tomorrow.   Hypertension/volume - BP variable, mildly elevated today. Losartan changed to PM dosing.  Does not appear volume overloaded.  Under dry weight, will  need to be lowered on d/c ~81kg. Anemia CKD - As noted in # 2. aranesp 141mcg weekly - changed to Sebastian. Transfuse PRN. Metabolic bone disease -  PTH only 110 03/26/21 on 5 mcg Hectorol, hectorol was held at that time.  Rechecked  pth 128. No need to restart VDRA.  Changed renvela to powder d/t patient inability to swallow pills.  Nutrition - note dysphagia diet DTM2- per primary Pleuritic abdominal/chest pain: improved today  Jen Mow, PA-C [facility] 05/06/2021,1:32 PM  LOS: 0 days

## 2021-05-06 NOTE — Progress Notes (Signed)
EEG complete - results pending 

## 2021-05-06 NOTE — Discharge Summary (Deleted)
Physician Discharge Summary  Patient ID: [name] MRN: [id number] DOB/AGE: 07/23/1951 70 y.o.  Admit date: 04/20/2021 Discharge date: 05/06/2021  Discharge Diagnoses:  Right basal ganglia infarction DVT prophylaxis Right hip bursitis Anxiety Acute lower GI bleed Newly diagnosed end-stage renal disease Hypertension Asthma Hyperlipidemia Obesity Diabetes mellitus Constipation Chronic diastolic congestive heart failure  Discharged Condition: Guarded  Significant Diagnostic Studies: CT ANGIO HEAD W OR WO CONTRAST  Result Date: 05/05/2021 CLINICAL DATA:  Initial evaluation for acute stroke, TIA. EXAM: CT ANGIOGRAPHY HEAD AND NECK CT PERFUSION BRAIN TECHNIQUE: Multidetector CT imaging of the head and neck was performed using the standard protocol during bolus administration of intravenous contrast. Multiplanar CT image reconstructions and MIPs were obtained to evaluate the vascular anatomy. Carotid stenosis measurements (when applicable) are obtained utilizing NASCET criteria, using the distal internal carotid diameter as the denominator. Multiphase CT imaging of the brain was performed following IV bolus contrast injection. Subsequent parametric perfusion maps were calculated using RAPID software. CONTRAST:  112mL OMNIPAQUE IOHEXOL 350 MG/ML SOLN COMPARISON:  Prior head CT from earlier the same day as well as previous MRA from 04/17/2021. FINDINGS: CTA NECK FINDINGS Aortic arch: Visualized aortic arch normal in caliber with normal 3 vessel morphology. No hemodynamically significant stenosis seen about the origin of the great vessels. Right carotid system: Right common and internal carotid arteries patent without stenosis, dissection or occlusion. Minimal atheromatous plaque about the proximal cervical right ICA without stenosis. Left carotid system: Left common and internal carotid arteries patent without stenosis, dissection or occlusion. Vertebral arteries: Both vertebral arteries arise  from subclavian arteries. No proximal subclavian artery stenosis. Vertebral arteries diminutive but widely patent within the neck without stenosis, dissection or occlusion. Skeleton: No visible acute osseous finding. No discrete or worrisome osseous lesions. Other neck: No other acute soft tissue abnormality within the neck. 7 mm right thyroid nodule noted, of doubtful significance given size and patient age, no follow-up imaging recommended (ref: J Am Coll Radiol. 2015 Feb;12(2): 143-50). Right-sided hemodialysis catheter in place. Upper chest: Visualized upper chest demonstrates no acute finding. Review of the MIP images confirms the above findings CTA HEAD FINDINGS Anterior circulation: Petrous segments patent bilaterally. Mild atheromatous change within the carotid siphons without hemodynamically significant stenosis. No made of a subtle 2 mm focal outpouching arising from the proximal cavernous right ICA, which could reflect a small aneurysm versus vascular infundibulum (series 7, image 254). A1 segments patent bilaterally. Normal anterior communicating artery complex. Anterior cerebral arteries patent to their distal aspects without stenosis. No M1 stenosis or occlusion. Normal MCA bifurcations. Distal MCA branches well perfused and symmetric. Posterior circulation: Both V4 segments patent to the vertebrobasilar junction without stenosis. Both PICA origins patent and normal. Basilar patent to its distal aspect without stenosis. Superior cerebellar arteries patent bilaterally. Both PCA supplied via the basilar as well as prominent bilateral posterior communicating arteries. Atheromatous irregularity within the bilateral P2 segments with associated mild to moderate multifocal narrowing. PCAs remain patent to their distal aspects. Venous sinuses: Grossly patent allowing for timing the contrast bolus. Anatomic variants: None significant. Review of the MIP images confirms the above findings CT Brain Perfusion  Findings: ASPECTS: Not calculated. The CT perfusion portion of this exam unfortunately failed due to technical difficulty. No perfusion maps were generated. IMPRESSION: 1. Negative CTA for emergent large vessel occlusion. 2. Mild atheromatous change about the major arterial vasculature of the head and neck as above. No proximal high-grade or correctable stenosis. 3. 2 mm focal outpouching arising  from the proximal cavernous right ICA, which could reflect a small aneurysm versus vascular infundibulum. Attention at follow-up recommended. 4. CT perfusion portion of this exam unfortunately failed due to technical difficulty. No perfusion maps were generated. Critical Value/emergent results were called by telephone at the time of interpretation on 05/05/2021 at 8:15 pm to provider ERIC [facility], who verbally acknowledged these results. Electronically Signed   By: Jeannine Boga M.D.   On: 05/05/2021 21:04   DG Chest 2 View  Result Date: 05/03/2021 CLINICAL DATA:  70 year old female with shortness of breath. EXAM: CHEST - 2 VIEW COMPARISON:  Chest radiograph dated 04/29/2021. FINDINGS: Dialysis catheter with tip in similar position close to the cavoatrial junction. No focal consolidation, pleural effusion or pneumothorax. Left lung base atelectasis. Stable cardiac silhouette. No acute osseous pathology. Degenerative changes of the spine. IMPRESSION: No active cardiopulmonary disease. Electronically Signed   By: Anner Crete M.D.   On: 05/03/2021 21:23   DG Chest 2 View  Result Date: 04/29/2021 CLINICAL DATA:  Pt experiencing SOB  CVA EXAM: CHEST - 2 VIEW COMPARISON:  04/22/2021 FINDINGS: Relatively low lung volumes. Chronic linear scarring or subsegmental atelectasis in the left infrahilar region and laterally in the left mid lung. Heart size upper limits normal. Tunneled right IJ hemodialysis catheter extends to the mid right atrium as before. No effusion.  No pneumothorax. Thoracic dextroscoliosis.  IMPRESSION: Low lung volumes.  No acute findings. Electronically Signed   By: Lucrezia Europe M.D.   On: 04/29/2021 11:42   DG Chest 2 View  Result Date: 04/22/2021 CLINICAL DATA:  Leukocytosis. EXAM: CHEST - 2 VIEW COMPARISON:  April 16, 2021. FINDINGS: Stable cardiomediastinal silhouette. Right internal jugular dialysis catheter is unchanged. No pneumothorax is noted. Minimal left basilar subsegmental atelectasis is noted. Right lung is clear. Bony thorax is unremarkable. IMPRESSION: Minimal left basilar subsegmental atelectasis. Electronically Signed   By: Marijo Conception M.D.   On: 04/22/2021 14:04   DG Abd 1 View  Result Date: 04/26/2021 CLINICAL DATA:  LEFT-sided abdominal pain since yesterday. Constipation. EXAM: ABDOMEN - 1 VIEW COMPARISON:  CT of the abdomen and pelvis on 12/13/2004 FINDINGS: Bowel gas pattern is nonobstructive. Moderate stool burden noted within nondilated loops of colon. Scoliosis is associated with degenerative changes in the lumbar spine. IMPRESSION: No evidence for acute  abnormality.  Moderate stool burden. Electronically Signed   By: Nolon Nations M.D.   On: 04/26/2021 12:44   CT HEAD WO CONTRAST  Result Date: 05/05/2021 CLINICAL DATA:  70 year old female with neurologic deficit. EXAM: CT HEAD WITHOUT CONTRAST TECHNIQUE: Contiguous axial images were obtained from the base of the skull through the vertex without intravenous contrast. COMPARISON:  Head CT dated 05/03/2021. FINDINGS: Brain: Mild age-related atrophy and moderate chronic microvascular ischemic changes. An area of infarct again noted lateral to the right thalamus, seen on the prior CT. There is no acute intracranial hemorrhage. No mass effect or midline shift. No extra-axial fluid collection. Vascular: No hyperdense vessel or unexpected calcification. Skull: Normal. Negative for fracture or focal lesion. Sinuses/Orbits: The visualized paranasal sinuses and mastoid air cells are clear. Bilateral maxillary sinus  surgeries. Other: None IMPRESSION: 1. No acute intracranial pathology. 2. Age-related atrophy and chronic microvascular ischemic changes. Similar appearance of focal area of old infarct lateral to the right thalamus. Electronically Signed   By: Anner Crete M.D.   On: 05/05/2021 19:56   CT HEAD WO CONTRAST  Result Date: 05/03/2021 CLINICAL DATA:  Mental status changes of unknown  cause. EXAM: CT HEAD WITHOUT CONTRAST TECHNIQUE: Contiguous axial images were obtained from the base of the skull through the vertex without intravenous contrast. COMPARISON:  04/18/2021 FINDINGS: Brain: No focal abnormality seen affecting the brainstem or cerebellum. Subacute infarction in the right lateral thalamus/posterior limb internal capsule is more clearly visible. No evidence of extension. Chronic small-vessel ischemic changes elsewhere throughout the cerebral hemispheric white matter as seen previously. No sign of new insult, mass, hemorrhage, hydrocephalus or extra-axial collection. Vascular: There is atherosclerotic calcification of the major vessels at the base of the brain. Skull: Negative Sinuses/Orbits: Clear sinuses. Previous functional endoscopic sinus surgery. Orbits negative. Other: None IMPRESSION: No acute CT finding. Subacute infarction in the right lateral thalamus/radiating white matter tracts is more clearly visible but there is no sign of extension or hemorrhage. Chronic small-vessel ischemic changes elsewhere affecting the cerebral hemispheric white matter. Electronically Signed   By: Nelson Chimes M.D.   On: 05/03/2021 19:06   CT HEAD WO CONTRAST  Result Date: 04/18/2021 CLINICAL DATA:  Follow-up stroke. EXAM: CT HEAD WITHOUT CONTRAST TECHNIQUE: Contiguous axial images were obtained from the base of the skull through the vertex without intravenous contrast. COMPARISON:  CT scan April 14, 2021.  MRI of the head April 16, 2021. FINDINGS: Brain: No subdural, epidural, or subarachnoid hemorrhage. No mass  effect or midline shift. Ventricles and sulci are unchanged. The known right basal ganglia infarct is more pronounced on CT imaging in the interval seen on series 4, image 17 with decreased attenuation. No associated hemorrhage. White matter changes otherwise stable. Cerebellum, brainstem, and basal cisterns are normal. No other evidence of acute infarct or ischemia. Vascular: Calcified atherosclerosis in the intracranial carotids. Skull: Normal. Negative for fracture or focal lesion. Sinuses/Orbits: No acute finding. Other: No other abnormalities. IMPRESSION: 1. The right basal ganglia infarct is better visualized on today's CT compared to the April 14, 2021 CT due to group decreasing attenuation. No associated hemorrhage identified. No other significant interval changes. Electronically Signed   By: Dorise Bullion III M.D   On: 04/18/2021 13:04   CT HEAD WO CONTRAST  Result Date: 04/14/2021 CLINICAL DATA:  Possible stroke.  LEFT-sided weakness EXAM: CT HEAD WITHOUT CONTRAST TECHNIQUE: Contiguous axial images were obtained from the base of the skull through the vertex without intravenous contrast. COMPARISON:  None. FINDINGS: Brain: No acute intracranial hemorrhage. No focal mass lesion. No CT evidence of acute infarction. No midline shift or mass effect. No hydrocephalus. Basilar cisterns are patent. There are periventricular and subcortical white matter hypodensities. Generalized cortical atrophy. Vascular: No hyperdense vessel or unexpected calcification. Skull: Normal. Negative for fracture or focal lesion. Sinuses/Orbits: Paranasal sinuses and mastoid air cells are clear. Orbits are clear. Other: None. IMPRESSION: 1. No acute intracranial findings. 2. Atrophy and white matter microvascular disease. Electronically Signed   By: Suzy Bouchard M.D.   On: 04/14/2021 18:58   CT ANGIO NECK W OR WO CONTRAST  Result Date: 05/05/2021 CLINICAL DATA:  Initial evaluation for acute stroke, TIA. EXAM: CT  ANGIOGRAPHY HEAD AND NECK CT PERFUSION BRAIN TECHNIQUE: Multidetector CT imaging of the head and neck was performed using the standard protocol during bolus administration of intravenous contrast. Multiplanar CT image reconstructions and MIPs were obtained to evaluate the vascular anatomy. Carotid stenosis measurements (when applicable) are obtained utilizing NASCET criteria, using the distal internal carotid diameter as the denominator. Multiphase CT imaging of the brain was performed following IV bolus contrast injection. Subsequent parametric perfusion maps were calculated using RAPID  software. CONTRAST:  14mL OMNIPAQUE IOHEXOL 350 MG/ML SOLN COMPARISON:  Prior head CT from earlier the same day as well as previous MRA from 04/17/2021. FINDINGS: CTA NECK FINDINGS Aortic arch: Visualized aortic arch normal in caliber with normal 3 vessel morphology. No hemodynamically significant stenosis seen about the origin of the great vessels. Right carotid system: Right common and internal carotid arteries patent without stenosis, dissection or occlusion. Minimal atheromatous plaque about the proximal cervical right ICA without stenosis. Left carotid system: Left common and internal carotid arteries patent without stenosis, dissection or occlusion. Vertebral arteries: Both vertebral arteries arise from subclavian arteries. No proximal subclavian artery stenosis. Vertebral arteries diminutive but widely patent within the neck without stenosis, dissection or occlusion. Skeleton: No visible acute osseous finding. No discrete or worrisome osseous lesions. Other neck: No other acute soft tissue abnormality within the neck. 7 mm right thyroid nodule noted, of doubtful significance given size and patient age, no follow-up imaging recommended (ref: J Am Coll Radiol. 2015 Feb;12(2): 143-50). Right-sided hemodialysis catheter in place. Upper chest: Visualized upper chest demonstrates no acute finding. Review of the MIP images confirms  the above findings CTA HEAD FINDINGS Anterior circulation: Petrous segments patent bilaterally. Mild atheromatous change within the carotid siphons without hemodynamically significant stenosis. No made of a subtle 2 mm focal outpouching arising from the proximal cavernous right ICA, which could reflect a small aneurysm versus vascular infundibulum (series 7, image 254). A1 segments patent bilaterally. Normal anterior communicating artery complex. Anterior cerebral arteries patent to their distal aspects without stenosis. No M1 stenosis or occlusion. Normal MCA bifurcations. Distal MCA branches well perfused and symmetric. Posterior circulation: Both V4 segments patent to the vertebrobasilar junction without stenosis. Both PICA origins patent and normal. Basilar patent to its distal aspect without stenosis. Superior cerebellar arteries patent bilaterally. Both PCA supplied via the basilar as well as prominent bilateral posterior communicating arteries. Atheromatous irregularity within the bilateral P2 segments with associated mild to moderate multifocal narrowing. PCAs remain patent to their distal aspects. Venous sinuses: Grossly patent allowing for timing the contrast bolus. Anatomic variants: None significant. Review of the MIP images confirms the above findings CT Brain Perfusion Findings: ASPECTS: Not calculated. The CT perfusion portion of this exam unfortunately failed due to technical difficulty. No perfusion maps were generated. IMPRESSION: 1. Negative CTA for emergent large vessel occlusion. 2. Mild atheromatous change about the major arterial vasculature of the head and neck as above. No proximal high-grade or correctable stenosis. 3. 2 mm focal outpouching arising from the proximal cavernous right ICA, which could reflect a small aneurysm versus vascular infundibulum. Attention at follow-up recommended. 4. CT perfusion portion of this exam unfortunately failed due to technical difficulty. No perfusion  maps were generated. Critical Value/emergent results were called by telephone at the time of interpretation on 05/05/2021 at 8:15 pm to provider ERIC [facility], who verbally acknowledged these results. Electronically Signed   By: Jeannine Boga M.D.   On: 05/05/2021 21:04   MR ANGIO HEAD WO CONTRAST  Result Date: 04/17/2021 CLINICAL DATA:  Initial evaluation for acute stroke. EXAM: MRA NECK WITHOUT CONTRAST MRA HEAD WITHOUT CONTRAST TECHNIQUE: Multiplanar and multiecho pulse sequences of the neck were obtained without intravenous contrast. Angiographic images of the neck were obtained using MRA technique without and with intravenous contrast; Angiographic images of the Circle of Willis were obtained using MRA technique without intravenous contrast. COMPARISON:  Prior MRI from 04/16/2021. FINDINGS: MRA NECK FINDINGS AORTIC ARCH: Examination severely degraded by motion artifact. Partially  visualized aortic arch grossly normal in caliber with normal branch pattern. No appreciable stenosis about the origin of the great vessels. RIGHT CAROTID SYSTEM: Visualized right CCA patent without stenosis. No appreciable stenosis or significant irregularity about the right carotid bifurcation. Visualized right ICA patent without stenosis or occlusion. LEFT CAROTID SYSTEM: Visualized left CCA patent without stenosis. No significant atheromatous narrowing or irregularity about the left bifurcation. Left ICA patent without stenosis or occlusion. VERTEBRAL ARTERIES: Origins of the vertebral arteries not well assessed on this exam. Visualized portions of the vertebral arteries patent with antegrade flow, with no appreciable stenosis or occlusion. MRA HEAD FINDINGS ANTERIOR CIRCULATION: Examination moderately degraded by motion artifact. Visualized distal cervical segments of the internal carotid arteries are patent with antegrade flow. Petrous, cavernous, and supraclinoid segments patent without stenosis or other abnormality. A1  segments patent. Normal anterior communicating artery complex. Anterior cerebral arteries grossly patent to their distal aspects without appreciable stenosis. M1 segments patent bilaterally. Ill-defined attenuation involving the distal left M1 segment and proximal M2 branches favored to be artifactual on this exam. No definite proximal stenosis. MCA branches well perfused and symmetric. POSTERIOR CIRCULATION: Vertebral arteries codominant and patent to the vertebrobasilar junction without stenosis. Both PICA origins patent and normal. Basilar patent to its distal aspect without stenosis. Superior cerebellar arteries patent bilaterally. Both PCA supplied via the basilar as well as prominent bilateral posterior communicating arteries. PCAs patent to their distal aspects without definite proximal stenosis. No visible aneurysm or other vascular abnormality. IMPRESSION: 1. Technically limited exam due to extensive motion artifact. 2. Grossly negative MRA of the head and neck. No large vessel occlusion. No proximal high-grade or correctable stenosis. Electronically Signed   By: Jeannine Boga M.D.   On: 04/17/2021 03:36   MR ANGIO NECK WO CONTRAST  Result Date: 04/17/2021 CLINICAL DATA:  Initial evaluation for acute stroke. EXAM: MRA NECK WITHOUT CONTRAST MRA HEAD WITHOUT CONTRAST TECHNIQUE: Multiplanar and multiecho pulse sequences of the neck were obtained without intravenous contrast. Angiographic images of the neck were obtained using MRA technique without and with intravenous contrast; Angiographic images of the Circle of Willis were obtained using MRA technique without intravenous contrast. COMPARISON:  Prior MRI from 04/16/2021. FINDINGS: MRA NECK FINDINGS AORTIC ARCH: Examination severely degraded by motion artifact. Partially visualized aortic arch grossly normal in caliber with normal branch pattern. No appreciable stenosis about the origin of the great vessels. RIGHT CAROTID SYSTEM: Visualized right  CCA patent without stenosis. No appreciable stenosis or significant irregularity about the right carotid bifurcation. Visualized right ICA patent without stenosis or occlusion. LEFT CAROTID SYSTEM: Visualized left CCA patent without stenosis. No significant atheromatous narrowing or irregularity about the left bifurcation. Left ICA patent without stenosis or occlusion. VERTEBRAL ARTERIES: Origins of the vertebral arteries not well assessed on this exam. Visualized portions of the vertebral arteries patent with antegrade flow, with no appreciable stenosis or occlusion. MRA HEAD FINDINGS ANTERIOR CIRCULATION: Examination moderately degraded by motion artifact. Visualized distal cervical segments of the internal carotid arteries are patent with antegrade flow. Petrous, cavernous, and supraclinoid segments patent without stenosis or other abnormality. A1 segments patent. Normal anterior communicating artery complex. Anterior cerebral arteries grossly patent to their distal aspects without appreciable stenosis. M1 segments patent bilaterally. Ill-defined attenuation involving the distal left M1 segment and proximal M2 branches favored to be artifactual on this exam. No definite proximal stenosis. MCA branches well perfused and symmetric. POSTERIOR CIRCULATION: Vertebral arteries codominant and patent to the vertebrobasilar junction without stenosis. Both PICA  origins patent and normal. Basilar patent to its distal aspect without stenosis. Superior cerebellar arteries patent bilaterally. Both PCA supplied via the basilar as well as prominent bilateral posterior communicating arteries. PCAs patent to their distal aspects without definite proximal stenosis. No visible aneurysm or other vascular abnormality. IMPRESSION: 1. Technically limited exam due to extensive motion artifact. 2. Grossly negative MRA of the head and neck. No large vessel occlusion. No proximal high-grade or correctable stenosis. Electronically Signed    By: Jeannine Boga M.D.   On: 04/17/2021 03:36   MR BRAIN WO CONTRAST  Result Date: 04/16/2021 CLINICAL DATA:  Initial evaluation for acute stroke. EXAM: MRI HEAD WITHOUT CONTRAST TECHNIQUE: Multiplanar, multiecho pulse sequences of the brain and surrounding structures were obtained without intravenous contrast. COMPARISON:  Prior head CT from 04/14/2021. FINDINGS: Brain: Cerebral volume within normal limits for age. Patchy and hazy T2/FLAIR hyperintensity within the periventricular deep white matter both cerebral hemispheres most consistent with chronic small vessel ischemic disease, moderately advanced in nature. 2.1 cm focus of restricted diffusion seen involving the right basal ganglia, consistent with an acute ischemic infarct. No associated hemorrhage or mass effect. No other evidence for acute or subacute ischemia. Gray-white matter differentiation otherwise maintained. No other areas of acute or subacute infarction. No encephalomalacia to suggest chronic cortical infarction. No foci of susceptibility artifact to suggest acute or chronic intracranial hemorrhage. No mass lesion, midline shift or mass effect. No hydrocephalus or extra-axial fluid collection. Pituitary gland within normal limits. Suprasellar region normal. Midline structures intact. Vascular: Major intracranial vascular flow voids are maintained. Skull and upper cervical spine: Craniocervical junction within normal limits. Bone marrow signal intensity normal. No scalp soft tissue abnormality. Sinuses/Orbits: Patient status post bilateral ocular lens replacement. Globes and orbital soft tissues demonstrate no acute finding. Mild scattered mucosal thickening noted within the ethmoidal air cells. Paranasal sinuses are otherwise clear. No mastoid effusion. Inner ear structures grossly normal. Other: None. IMPRESSION: 1. 2.1 cm acute ischemic nonhemorrhagic right basal ganglia infarct. 2. Underlying moderate chronic microvascular ischemic  disease. Electronically Signed   By: Jeannine Boga M.D.   On: 04/16/2021 21:16   CT CEREBRAL PERFUSION W CONTRAST  Result Date: 05/05/2021 CLINICAL DATA:  Initial evaluation for acute stroke, TIA. EXAM: CT ANGIOGRAPHY HEAD AND NECK CT PERFUSION BRAIN TECHNIQUE: Multidetector CT imaging of the head and neck was performed using the standard protocol during bolus administration of intravenous contrast. Multiplanar CT image reconstructions and MIPs were obtained to evaluate the vascular anatomy. Carotid stenosis measurements (when applicable) are obtained utilizing NASCET criteria, using the distal internal carotid diameter as the denominator. Multiphase CT imaging of the brain was performed following IV bolus contrast injection. Subsequent parametric perfusion maps were calculated using RAPID software. CONTRAST:  158mL OMNIPAQUE IOHEXOL 350 MG/ML SOLN COMPARISON:  Prior head CT from earlier the same day as well as previous MRA from 04/17/2021. FINDINGS: CTA NECK FINDINGS Aortic arch: Visualized aortic arch normal in caliber with normal 3 vessel morphology. No hemodynamically significant stenosis seen about the origin of the great vessels. Right carotid system: Right common and internal carotid arteries patent without stenosis, dissection or occlusion. Minimal atheromatous plaque about the proximal cervical right ICA without stenosis. Left carotid system: Left common and internal carotid arteries patent without stenosis, dissection or occlusion. Vertebral arteries: Both vertebral arteries arise from subclavian arteries. No proximal subclavian artery stenosis. Vertebral arteries diminutive but widely patent within the neck without stenosis, dissection or occlusion. Skeleton: No visible acute osseous finding. No discrete  or worrisome osseous lesions. Other neck: No other acute soft tissue abnormality within the neck. 7 mm right thyroid nodule noted, of doubtful significance given size and patient age, no  follow-up imaging recommended (ref: J Am Coll Radiol. 2015 Feb;12(2): 143-50). Right-sided hemodialysis catheter in place. Upper chest: Visualized upper chest demonstrates no acute finding. Review of the MIP images confirms the above findings CTA HEAD FINDINGS Anterior circulation: Petrous segments patent bilaterally. Mild atheromatous change within the carotid siphons without hemodynamically significant stenosis. No made of a subtle 2 mm focal outpouching arising from the proximal cavernous right ICA, which could reflect a small aneurysm versus vascular infundibulum (series 7, image 254). A1 segments patent bilaterally. Normal anterior communicating artery complex. Anterior cerebral arteries patent to their distal aspects without stenosis. No M1 stenosis or occlusion. Normal MCA bifurcations. Distal MCA branches well perfused and symmetric. Posterior circulation: Both V4 segments patent to the vertebrobasilar junction without stenosis. Both PICA origins patent and normal. Basilar patent to its distal aspect without stenosis. Superior cerebellar arteries patent bilaterally. Both PCA supplied via the basilar as well as prominent bilateral posterior communicating arteries. Atheromatous irregularity within the bilateral P2 segments with associated mild to moderate multifocal narrowing. PCAs remain patent to their distal aspects. Venous sinuses: Grossly patent allowing for timing the contrast bolus. Anatomic variants: None significant. Review of the MIP images confirms the above findings CT Brain Perfusion Findings: ASPECTS: Not calculated. The CT perfusion portion of this exam unfortunately failed due to technical difficulty. No perfusion maps were generated. IMPRESSION: 1. Negative CTA for emergent large vessel occlusion. 2. Mild atheromatous change about the major arterial vasculature of the head and neck as above. No proximal high-grade or correctable stenosis. 3. 2 mm focal outpouching arising from the proximal  cavernous right ICA, which could reflect a small aneurysm versus vascular infundibulum. Attention at follow-up recommended. 4. CT perfusion portion of this exam unfortunately failed due to technical difficulty. No perfusion maps were generated. Critical Value/emergent results were called by telephone at the time of interpretation on 05/05/2021 at 8:15 pm to provider ERIC [facility], who verbally acknowledged these results. Electronically Signed   By: Jeannine Boga M.D.   On: 05/05/2021 21:04   DG Chest Port 1 View  Result Date: 05/05/2021 CLINICAL DATA:  Shortness of breath EXAM: PORTABLE CHEST 1 VIEW COMPARISON:  05/03/2021 FINDINGS: Cardiac shadow is mildly enlarged but stable. Right jugular central line is again seen deep in the right atrium but stable. Lungs are well aerated bilaterally with patchy stable atelectasis/scarring on the left. No acute abnormality is seen. IMPRESSION: No acute abnormality noted. Stable changes in the left base are seen. Electronically Signed   By: Inez Catalina M.D.   On: 05/05/2021 19:08   DG CHEST PORT 1 VIEW  Result Date: 04/16/2021 CLINICAL DATA:  Stroke. EXAM: PORTABLE CHEST 1 VIEW COMPARISON:  Chest x-ray dated Mar 23, 2021. FINDINGS: Unchanged tunneled right internal jugular dialysis catheter. Stable cardiomegaly. Unchanged mild left basilar atelectasis. No focal consolidation, pleural effusion, or pneumothorax. No acute osseous abnormality. IMPRESSION: No active disease. Electronically Signed   By: Titus Dubin M.D.   On: 04/16/2021 20:32    Labs:  Basic Metabolic Panel: Recent Labs  Lab 04/29/21 1325 04/30/21 0508 05/01/21 0553 05/03/21 1044 05/05/21 1131  NA 129* 131* 132* 131* 132*  K 4.9 4.4 4.2 4.8 4.2  CL 94* 97* 98 97* 94*  CO2 26 25 27 25 30   GLUCOSE 397* 287* 163* 306* 123*  BUN 28*  21 23 21 14   CREATININE 5.73* 4.82* 5.50* 5.90* 5.78*  CALCIUM 8.6* 8.9 8.8* 8.6* 9.5  PHOS 4.2  --  4.7* 4.8* 5.1*    CBC: Recent Labs  Lab  05/01/21 0553 05/03/21 1044 05/05/21 1131 05/06/21 0456  WBC 13.0* 12.8* 10.1 8.8  NEUTROABS 6.9 7.9*  --   --   HGB 9.8* 9.7* 11.1* 10.6*  HCT 28.4* 29.5* 33.2* 31.0*  MCV 86.1 88.6 87.4 88.6  PLT 238 299 391 235    CBG: Recent Labs  Lab 05/05/21 1143 05/05/21 1648 05/05/21 1841 05/05/21 2106 05/06/21 0605  GLUCAP 113* 129* 152* 126* 82    Brief HPI:   Carolyn Russo is a 70 y.o. right-handed female with significant history of hypertension obesity with BMI 34.31 hyperlipidemia newly diagnosed end-stage renal disease with hemodialysis Monday Wednesday Friday diabetes mellitus CAD maintained on aspirin iron deficiency anemia and asthma.  Patient with latest admission 03/09/2021 to 03/24/2021 for acute on chronic diastolic congestive heart failure exacerbation.  Per chart review lives with spouse.  Two-level home.  Independent with cane prior to admission.  Presented 04/16/2021 left-sided weakness x3 days slurred speech and bouts of vomiting.  It was noted patient had initially presented to the ER couple days prior to the latest admission with complaints of left-sided weakness but left without being formally evaluated.  Cranial CT scan showed no acute abnormalities.  Patient did not receive tPA.  MRI of the brain showed a 2.1 cm acute ischemic nonhemorrhagic right basal ganglia infarction.  MRA angiogram of head and neck grossly negative no large vessel occlusion or high-grade stenosis.  Most recent echocardiogram with ejection fraction of 69% grade 2 diastolic dysfunction.  Admission chemistries hemoglobin 8.8-9.0 chemistries unremarkable except creatinine 3.58 hemoglobin A1c 5.2.  Hospital course gastroenterology services consulted 04/18/2021 after patient had multiple large bloody bowel movements with clots.  Underwent upper endoscopy 04/18/2021 showing normal esophagus and stomach and the cardia and gastric fundus normal on retroflexion and also underwent flexible sigmoidoscopy showing  hemorrhoids found on perianal exam external/internal hemorrhoids.  Patient had initially been placed on aspirin and Plavix for CVA prophylaxis held due to suspected lower GI bleed awaiting plan on anticoagulation.  Renal service follow-up hemodialysis ongoing.  Due to patient decreased functional ability left side weakness was admitted for a comprehensive rehab program.   Hospital Course: Margueritte HALLI EQUIHUA was admitted to rehab (Not on file) for inpatient therapies to consist of PT, ST and OT at least three hours five days a week. Past admission physiatrist, therapy team and rehab RN have worked together to provide customized collaborative inpatient rehab.  Pertaining to patient's right basal ganglia infarction followed by neurology services initially her anticoagulation was held due to work-up for lower GI bleed aspirin slowly introduced as well as Plavix and monitor for any bleeding episodes.  During patient's rehab course had some right hip bursitis maintained on tramadol Lidoderm patch.  Bouts of anxiety Xanax as needed with emotional support provided.  In regards to patient's acute lower GI bleed follow-up GI services latest hemoglobin 11.1.  Chronic diastolic congestive heart failure Lasix as advised monitoring for any signs of fluid overload.  Patient continue with hemodialysis as advised she had undergone fistula placement per vascular surgery monitor closely questionable steal syndrome and monitor for any other needs for additional intervention.  Blood pressure did have some transient variables continued regimen Coreg as advised.  Patient did have a history of asthma monitoring of oxygen saturations reportedly was  getting home oxygen prior to admission and latest chest x-ray was unremarkable.  Noted obesity BMI 34.31 dietary follow-up.  Crestor ongoing for hyperlipidemia.  Blood sugars monitored hemoglobin A1c 5.2 Lantus insulin as directed.  Bouts of constipation resolved with laxative assistance.  She was  having some difficulty with sleep adding of melatonin monitoring for any increased lethargy.  On the evening of 05/05/2021 patient with altered mental status changes with rapid response and neurology services contacted.  Cranial CT scan showed no acute intracranial pathology.  Negative CTA for emergent large vessel occlusion there was a 2 mm focal outpouching arising from the proximal cavernous right ICA which could possibly reflect a small aneurysm versus vascular infundibulum.  Her latest renal function follow-up sodium 132 potassium 4.2 creatinine 5.78, hemoglobin 11.1.  Due to patient decrease in mental status she was discharged to acute care services for ongoing evaluation and monitoring.   Blood pressures were monitored on TID basis and soft and monitored  Diabetes has been monitored with ac/hs CBG checks and SSI was use prn for tighter BS control.    Rehab course: During patient's stay in rehab weekly team conferences were held to monitor patient's progress, set goals and discuss barriers to discharge. At admission, patient required minimal assist ambulation 15 feet minimal assist stand pivot transfers max assist lower body dressing minimal assist toilet transfers  He/She  has had improvement in activity tolerance, balance, postural control as well as ability to compensate for deficits. He/She has had improvement in functional use RUE/LUE  and RLE/LLE as well as improvement in awareness.  Patient contact-guard assist for most transfers mobility rolling walker with variables at times requiring min mod assist.  She demonstrated deficits in muscle weakness decreased cardiorespiratory endurance and decrease oxygen support with decreased coordination decreased attention to left decreased initiation.  ADLs sessions focused on bathing dressing grooming required mod encouragement to do so at times.  Required minimal assist for upper body dressing to pull shirt down on her back.  Patient easily became  fatigued.       Disposition: Discharged to acute care services    Diet: As directed  Special Instructions: Patient's medications will be adjusted as per hospitalist team.   Allergies as of 05/06/2021       Reactions   Food Anaphylaxis   Peanuts; Almonds   Other Shortness Of Breath   Peanuts; Almonds   Wheat Bran Shortness Of Breath   Statins Itching, Other (See Comments)   Generalized aches   Iodine Other (See Comments)   The patient denied this allergy to me on 03/16/2021   Shellfish Allergy Other (See Comments)   Mouth gets raw   Sitagliptin    Other reaction(s): Unknown   Tetracycline Other (See Comments)   Raw mouth   Tetracyclines & Related Itching   Contrast Media [iodinated Diagnostic Agents] Rash   Happened during CT scan over 30 years ago        Medication List      Notice   Cannot display discharge medications because the patient has not yet been admitted.      Signed: Lavon Paganini Kenneth Cuaresma 05/06/2021, 6:24 AM

## 2021-05-06 NOTE — Progress Notes (Signed)
Called to 3W to give report, but nurse is busy with other pt and unable to take report at this time. Nurse will call back.

## 2021-05-06 NOTE — TOC Initial Note (Signed)
Transition of Care [facility]) - Initial/Assessment Note    Patient Details  Name: Carolyn Russo MRN: [id number] Date of Birth: 11-01-1950  [facility] CM/SW Contact:    Verdell Carmine, RN Phone Number: 05/06/2021, 8:06 AM  Clinical Narrative:                  Patient was admitted to inpatient rehabilitation after ganglia stroke, yesterday had ALOC, brought to the hospital for evaluation.  Will likely discharge back to IP rehab.to continue rehab program. CM will follow for needs.       Patient Goals and CMS Choice        Expected Discharge Plan and Services     Discharge Planning Services: CM Consult   Living arrangements for the past 2 months: [place]                                      Prior Living Arrangements/Services Living arrangements for the past 2 months: [place] Lives with:: Spouse Patient language and need for interpreter reviewed:: Yes        Need for Family Participation in Patient Care: Yes (Comment) Care giver support system in place?: Yes (comment) Current home services:  (was at inpatient rehab) Criminal Activity/Legal Involvement Pertinent to Current Situation/Hospitalization: No - Comment as needed  Activities of Daily Living      Permission Sought/Granted                  Emotional Assessment           Psych Involvement: No (comment)  Admission diagnosis:  Stroke-like symptoms [R29.90] Patient Active Problem List   Diagnosis Date Noted   Stroke-like symptoms 05/06/2021   Bandemia    Cerebrovascular accident (CVA) of right basal ganglia ([place]) 04/20/2021   External hemorrhoids    CVA (cerebral vascular accident) ([place]) 04/16/2021   ESRD on dialysis ([place]) 04/16/2021   Hypertensive urgency 03/10/2021   Hilar enlargement    Anemia of chronic disease    Chronic diastolic CHF (congestive heart failure) ([place]) 03/09/2021   CKD (chronic kidney disease), stage V ([place]) 03/08/2021   Diabetic  retinopathy ([place]) 03/08/2021   Hyperglycemia due to type 2 diabetes mellitus ([place]) 03/08/2021   Pure hypercholesterolemia 03/08/2021   Anemia in chronic kidney disease 09/24/2020   Uncontrolled type 2 diabetes mellitus with hyperglycemia ([place]) 03/19/2019   Osteopenia 09/04/2018   Migraine 09/04/2018   Asthma 09/04/2018   Pseudophakia of both eyes 07/17/2018   Pseudophakia of left eye 07/03/2018   Open angle with borderline findings and high glaucoma risk in left eye 07/03/2018   Age-related nuclear cataract of right eye 07/03/2018   CAD (coronary artery disease), native coronary artery 04/27/2017   Abnormal nuclear stress test 04/04/2017   Dyspnea on exertion 03/09/2017   Appendicitis    Age-related hypermature cataract of both eyes 12/20/2016   Uterine leiomyoma 11/27/2012   Dyslipidemia (high LDL; low HDL) 11/25/2011   Obesity (BMI 30-39.9) 11/25/2011   Hypertensive disorder 11/21/2011   Type 2 diabetes mellitus ([place]) 11/21/2011   Abnormal EKG 11/21/2011   PCP:  [name], MD Pharmacy:   [facility] [place], [place] [address] AT [address] [name] [address] Phone: [phone] Fax: [phone]  [facility] (New Address) - [place], [place]  [address] AT Previously: [address], [place] [address] [place] [address] Phone: [phone] Fax: [phone]     Social Determinants of Health (SDOH) Interventions    Readmission Risk Interventions No flowsheet data found.

## 2021-05-06 NOTE — Evaluation (Signed)
Physical Therapy Evaluation Patient Details Name: Carolyn Russo MRN: [id number] DOB: 12-Dec-1950 Today's Date: 05/06/2021   History of Present Illness  70 y/o female transferred from [facility] on 7/13 due to AMS. CT head with no acute abnormalities and similar appearance of old infarct lateral to R thalamus. MRI pending. Recent R basal ganglia CVA on 04/16/21. PMH: diabetes, hypertension, hyperlipidemia, ESRD on HD MWF.  Clinical Impression  Patient was transferred from CIR and at the time requiring min guard-minA for mobility with RW. Patient currently functioning at a minA level for mobility with RW. Patient presents with generalized weakness, impaired balance, decreased activity tolerance, impaired cognition, decreased safety awareness. Husband states R hand incoordination and weakness since HD fistula placement on 7/8. Patient requires verbal and tactile cueing to assist with initiating tasks. Easily fatigued with short mobility distance. Patient will benefit from skilled PT services during acute stay to address listed deficits. Recommend return to CIR for maximizing functional mobility and independence.     Follow Up Recommendations CIR    Equipment Recommendations  Rolling Shareece Bultman with 5" wheels    Recommendations for Other Services Rehab consult     Precautions / Restrictions Precautions Precautions: Fall Precaution Comments: L inattention Restrictions Weight Bearing Restrictions: No      Mobility  Bed Mobility Overal bed mobility: Needs Assistance Bed Mobility: Supine to Sit;Sit to Supine     Supine to sit: Min assist Sit to supine: Min assist   General bed mobility comments: minA for trunk elevation and repositioning hips towards EOB. MinA to return LEs back to bed    Transfers Overall transfer level: Needs assistance Equipment used: Rolling Clemma Johnsen (2 wheeled) Transfers: Sit to/from Stand Sit to Stand: Min assist         General transfer comment: minA to stand from  low bed surface with use of momentum to assist. Cues for hand placement  Ambulation/Gait Ambulation/Gait assistance: Min assist Gait Distance (Feet): 20 Feet Assistive device: Rolling Emran Molzahn (2 wheeled) Gait Pattern/deviations: Step-through pattern;Decreased stride length;Decreased weight shift to right;Decreased dorsiflexion - left Gait velocity: decreased   General Gait Details: easily fatigued after 10' but able to ambulate back to bed. MinA for balance and RW management. Required cues for RW proximity and attention to L side  Stairs            Wheelchair Mobility    Modified Rankin (Stroke Patients Only) Modified Rankin (Stroke Patients Only) Pre-Morbid Rankin Score: Moderate disability Modified Rankin: Moderately severe disability     Balance Overall balance assessment: Needs assistance Sitting-balance support: Feet supported Sitting balance-Leahy Scale: Fair     Standing balance support: Bilateral upper extremity supported;During functional activity Standing balance-Leahy Scale: Poor Standing balance comment: needs UE support and external assist                             Pertinent Vitals/Pain Pain Assessment: Faces Faces Pain Scale: No hurt Pain Intervention(s): Monitored during session    Home Living Family/patient expects to be discharged to:: Inpatient rehab                      Prior Function Level of Independence: Independent with assistive device(s)         Comments: was using cane PTA (had prolonged recent hospitalization with resulting weakness and was receiving HHPT); cannot shower due to port-a-cath in rt upper chest     Hand Dominance  Extremity/Trunk Assessment   Upper Extremity Assessment Upper Extremity Assessment: Defer to OT evaluation    Lower Extremity Assessment Lower Extremity Assessment: Generalized weakness (grossly 4/5 bilaterally)    Cervical / Trunk Assessment Cervical / Trunk Assessment:  Kyphotic  Communication   Communication: HOH  Cognition Arousal/Alertness: Awake/alert Behavior During Therapy: Flat affect Overall Cognitive Status: Impaired/Different from baseline Area of Impairment: Orientation;Attention;Memory;Safety/judgement;Following commands;Awareness;Problem solving                 Orientation Level: Disoriented to;Place (states "at [facility]", recently transferred to 3W) Current Attention Level: Sustained Memory: Decreased short-term memory Following Commands: Follows one step commands with increased time Safety/Judgement: Decreased awareness of safety;Decreased awareness of deficits Awareness: Intellectual Problem Solving: Slow processing;Decreased initiation;Requires verbal cues;Requires tactile cues General Comments: A&Ox3, stating "at [facility]" but recently transferred to 3W within 1 hour of evaluation. Patient with sustained attention and requires increased time to process commands. Slow processing and difficulty initiation tasks with verbal and tactile cues provided.      General Comments      Exercises     Assessment/Plan    PT Assessment Patient needs continued PT services  PT Problem List Decreased strength;Decreased activity tolerance;Decreased balance;Decreased mobility;Decreased coordination;Decreased cognition;Decreased safety awareness       PT Treatment Interventions DME instruction;Gait training;Stair training;Functional mobility training;Therapeutic activities;Balance training;Neuromuscular re-education;Cognitive remediation;Patient/family education    PT Goals (Current goals can be found in the Care Plan section)  Acute Rehab PT Goals Patient Stated Goal: to get better PT Goal Formulation: With patient/family Time For Goal Achievement: 05/20/21 Potential to Achieve Goals: Good    Frequency Min 4X/week   Barriers to discharge        Co-evaluation               AM-PAC PT "6  Clicks" Mobility  Outcome Measure Help needed turning from your back to your side while in a flat bed without using bedrails?: A Little Help needed moving from lying on your back to sitting on the side of a flat bed without using bedrails?: A Little Help needed moving to and from a bed to a chair (including a wheelchair)?: A Little Help needed standing up from a chair using your arms (e.g., wheelchair or bedside chair)?: A Little Help needed to walk in hospital room?: A Little Help needed climbing 3-5 steps with a railing? : Total 6 Click Score: 16    End of Session Equipment Utilized During Treatment: Gait belt Activity Tolerance: Patient limited by fatigue Patient left: in bed;with bed alarm set;with call bell/phone within reach Nurse Communication: Mobility status PT Visit Diagnosis: Unsteadiness on feet (R26.81);Muscle weakness (generalized) (M62.81);Difficulty in walking, not elsewhere classified (R26.2);Other abnormalities of gait and mobility (R26.89);Hemiplegia and hemiparesis Hemiplegia - Right/Left: Left Hemiplegia - dominant/non-dominant: Non-dominant Hemiplegia - caused by: Cerebral infarction    Time: 0831-0908 PT Time Calculation (min) (ACUTE ONLY): 37 min   Charges:   PT Evaluation $PT Eval Moderate Complexity: 1 Mod PT Treatments $Therapeutic Activity: 8-22 mins        Arrion Broaddus A. Gilford Rile PT, DPT Acute Rehabilitation Services Pager [phone] Office [phone]   Linna Hoff 05/06/2021, 9:33 AM

## 2021-05-06 NOTE — Progress Notes (Signed)
Pt left for CT.

## 2021-05-06 NOTE — Progress Notes (Signed)
Report given to 3W  nurse_Sonya.

## 2021-05-07 ENCOUNTER — Inpatient Hospital Stay (HOSPITAL_REHABILITATION)
Admission: RE | Admit: 2021-05-07 | Discharge: 2021-05-27 | Disposition: A | Discharge status: Home / Self Care | Payer: Medicare Other | Source: Intra-hospital | Attending: Physical Medicine and Rehabilitation | Admitting: Physical Medicine and Rehabilitation

## 2021-05-07 DIAGNOSIS — E1169 Type 2 diabetes mellitus with other specified complication: Secondary | ICD-10-CM

## 2021-05-07 DIAGNOSIS — N189 Chronic kidney disease, unspecified: Secondary | ICD-10-CM

## 2021-05-07 DIAGNOSIS — N186 End stage renal disease: Secondary | ICD-10-CM | POA: Diagnosis not present

## 2021-05-07 DIAGNOSIS — E1122 Type 2 diabetes mellitus with diabetic chronic kidney disease: Secondary | ICD-10-CM | POA: Diagnosis not present

## 2021-05-07 DIAGNOSIS — I6381 Other cerebral infarction due to occlusion or stenosis of small artery: Secondary | ICD-10-CM | POA: Diagnosis present

## 2021-05-07 DIAGNOSIS — I10 Essential (primary) hypertension: Secondary | ICD-10-CM

## 2021-05-07 DIAGNOSIS — J45909 Unspecified asthma, uncomplicated: Secondary | ICD-10-CM | POA: Diagnosis not present

## 2021-05-07 DIAGNOSIS — R1012 Left upper quadrant pain: Secondary | ICD-10-CM

## 2021-05-07 DIAGNOSIS — I5032 Chronic diastolic (congestive) heart failure: Secondary | ICD-10-CM | POA: Diagnosis not present

## 2021-05-07 DIAGNOSIS — D631 Anemia in chronic kidney disease: Secondary | ICD-10-CM

## 2021-05-07 DIAGNOSIS — R569 Unspecified convulsions: Secondary | ICD-10-CM | POA: Diagnosis not present

## 2021-05-07 DIAGNOSIS — E669 Obesity, unspecified: Secondary | ICD-10-CM

## 2021-05-07 DIAGNOSIS — Z992 Dependence on renal dialysis: Secondary | ICD-10-CM

## 2021-05-07 DIAGNOSIS — I639 Cerebral infarction, unspecified: Secondary | ICD-10-CM

## 2021-05-07 DIAGNOSIS — I2510 Atherosclerotic heart disease of native coronary artery without angina pectoris: Secondary | ICD-10-CM | POA: Diagnosis not present

## 2021-05-07 DIAGNOSIS — Z8673 Personal history of transient ischemic attack (TIA), and cerebral infarction without residual deficits: Secondary | ICD-10-CM | POA: Diagnosis not present

## 2021-05-07 DIAGNOSIS — R4182 Altered mental status, unspecified: Secondary | ICD-10-CM | POA: Diagnosis not present

## 2021-05-07 LAB — RENAL FUNCTION PANEL
Albumin: 3 g/dL — ABNORMAL LOW (ref 3.5–5.0)
Anion gap: 9 (ref 5–15)
BUN: 22 mg/dL (ref 8–23)
CO2: 27 mmol/L (ref 22–32)
Calcium: 8.8 mg/dL — ABNORMAL LOW (ref 8.9–10.3)
Chloride: 99 mmol/L (ref 98–111)
Creatinine, Ser: 6.99 mg/dL — ABNORMAL HIGH (ref 0.44–1.00)
GFR, Estimated: 6 mL/min — ABNORMAL LOW (ref >60)
Glucose, Bld: 156 mg/dL — ABNORMAL HIGH (ref 70–99)
Phosphorus: 4.8 mg/dL — ABNORMAL HIGH (ref 2.5–4.6)
Potassium: 4.2 mmol/L (ref 3.5–5.1)
Sodium: 135 mmol/L (ref 135–145)

## 2021-05-07 LAB — GLUCOSE, CAPILLARY
Glucose-Capillary: 173 mg/dL — ABNORMAL HIGH (ref 70–99)
Glucose-Capillary: 154 mg/dL — ABNORMAL HIGH (ref 70–99)
Glucose-Capillary: 157 mg/dL — ABNORMAL HIGH (ref 70–99)
Glucose-Capillary: 155 mg/dL — ABNORMAL HIGH (ref 70–99)

## 2021-05-07 LAB — CBC
HCT: 31.2 % — ABNORMAL LOW (ref 36.0–46.0)
Hemoglobin: 10.6 g/dL — ABNORMAL LOW (ref 12.0–15.0)
MCH: 29.7 pg (ref 26.0–34.0)
MCHC: 34 g/dL (ref 30.0–36.0)
MCV: 87.4 fL (ref 80.0–100.0)
Platelets: 370 10*3/uL (ref 150–400)
RBC: 3.57 MIL/uL — ABNORMAL LOW (ref 3.87–5.11)
RDW: 16 % — ABNORMAL HIGH (ref 11.5–15.5)
WBC: 12.1 10*3/uL — ABNORMAL HIGH (ref 4.0–10.5)
nRBC: 0 % (ref 0.0–0.2)

## 2021-05-07 MED ORDER — HEPARIN SODIUM (PORCINE) 1000 UNIT/ML DIALYSIS: 1000.0000 [IU] | INTRAMUSCULAR | Status: DC | PRN

## 2021-05-07 MED ORDER — VITAMIN D 25 MCG (1000 UNIT) PO TABS
2000.0000 [IU] | ORAL_TABLET | Freq: Every day | ORAL | Status: DC
  Administered 2021-05-08 – 2021-05-27 (×20): 2000 [IU] via ORAL
  Filled 2021-05-07 (×20): qty 2

## 2021-05-07 MED ORDER — ACETAMINOPHEN 325 MG PO TABS
650.0000 mg | ORAL_TABLET | ORAL | Status: DC | PRN
  Administered 2021-05-09 – 2021-05-25 (×12): 650 mg via ORAL
  Filled 2021-05-07 (×14): qty 2

## 2021-05-07 MED ORDER — LIDOCAINE HCL (PF) 1 % IJ SOLN: 5.0000 mL | INTRAMUSCULAR | Status: DC | PRN

## 2021-05-07 MED ORDER — ALTEPLASE 2 MG IJ SOLR: 2.0000 mg | Freq: Once | INTRAMUSCULAR | Status: DC | PRN

## 2021-05-07 MED ORDER — CLOPIDOGREL BISULFATE 75 MG PO TABS: 75.0000 mg | ORAL_TABLET | Freq: Every day | ORAL | Status: DC

## 2021-05-07 MED ORDER — ACETAMINOPHEN 160 MG/5ML PO SOLN: 650.0000 mg | ORAL | Status: DC | PRN

## 2021-05-07 MED ORDER — MOMETASONE FURO-FORMOTEROL FUM 200-5 MCG/ACT IN AERO
2.0000 | INHALATION_SPRAY | Freq: Two times a day (BID) | RESPIRATORY_TRACT | Status: DC
  Administered 2021-05-07 – 2021-05-27 (×38): 2 via RESPIRATORY_TRACT
  Filled 2021-05-07: qty 8.8

## 2021-05-07 MED ORDER — LEVETIRACETAM 500 MG PO TABS: 500.0000 mg | ORAL_TABLET | Freq: Every day | ORAL | Status: DC

## 2021-05-07 MED ORDER — SEVELAMER CARBONATE 800 MG PO TABS
800.0000 mg | ORAL_TABLET | Freq: Three times a day (TID) | ORAL | Status: DC
  Administered 2021-05-07 – 2021-05-22 (×43): 800 mg via ORAL
  Filled 2021-05-07 (×43): qty 1

## 2021-05-07 MED ORDER — SODIUM CHLORIDE 0.9 % IV SOLN: 100.0000 mL | INTRAVENOUS | Status: DC | PRN

## 2021-05-07 MED ORDER — SORBITOL 70 % SOLN
60.0000 mL | Status: AC
  Administered 2021-05-07: 60 mL via ORAL
  Filled 2021-05-07: qty 60

## 2021-05-07 MED ORDER — INSULIN ASPART 100 UNIT/ML IJ SOLN
0.0000 [IU] | Freq: Three times a day (TID) | INTRAMUSCULAR | Status: DC
  Administered 2021-05-07: 1 [IU] via SUBCUTANEOUS
  Administered 2021-05-08 (×2): 2 [IU] via SUBCUTANEOUS
  Administered 2021-05-09 (×2): 3 [IU] via SUBCUTANEOUS
  Administered 2021-05-09: 2 [IU] via SUBCUTANEOUS
  Administered 2021-05-10: 1 [IU] via SUBCUTANEOUS
  Administered 2021-05-10 – 2021-05-11 (×2): 2 [IU] via SUBCUTANEOUS
  Administered 2021-05-11 – 2021-05-12 (×3): 1 [IU] via SUBCUTANEOUS
  Administered 2021-05-12: 5 [IU] via SUBCUTANEOUS
  Administered 2021-05-12: 2 [IU] via SUBCUTANEOUS
  Administered 2021-05-13: 3 [IU] via SUBCUTANEOUS
  Administered 2021-05-13: 1 [IU] via SUBCUTANEOUS
  Administered 2021-05-14 (×2): 2 [IU] via SUBCUTANEOUS
  Administered 2021-05-15: 1 [IU] via SUBCUTANEOUS
  Administered 2021-05-16: 2 [IU] via SUBCUTANEOUS
  Administered 2021-05-16: 1 [IU] via SUBCUTANEOUS
  Administered 2021-05-16: 2 [IU] via SUBCUTANEOUS
  Administered 2021-05-17 – 2021-05-19 (×5): 1 [IU] via SUBCUTANEOUS
  Administered 2021-05-19: 2 [IU] via SUBCUTANEOUS
  Administered 2021-05-21 – 2021-05-22 (×2): 1 [IU] via SUBCUTANEOUS
  Administered 2021-05-22: 3 [IU] via SUBCUTANEOUS
  Administered 2021-05-23: 1 [IU] via SUBCUTANEOUS
  Administered 2021-05-23: 2 [IU] via SUBCUTANEOUS
  Administered 2021-05-24 – 2021-05-27 (×4): 1 [IU] via SUBCUTANEOUS

## 2021-05-07 MED ORDER — CARVEDILOL 3.125 MG PO TABS
3.1250 mg | ORAL_TABLET | Freq: Two times a day (BID) | ORAL | Status: DC
  Administered 2021-05-07 – 2021-05-27 (×35): 3.125 mg via ORAL
  Filled 2021-05-07 (×37): qty 1

## 2021-05-07 MED ORDER — CLOPIDOGREL BISULFATE 75 MG PO TABS
75.0000 mg | ORAL_TABLET | Freq: Every day | ORAL | Status: DC
  Administered 2021-05-08 – 2021-05-27 (×20): 75 mg via ORAL
  Filled 2021-05-07 (×20): qty 1

## 2021-05-07 MED ORDER — ACETAMINOPHEN 500 MG PO TABS: 500.0000 mg | ORAL_TABLET | Freq: Four times a day (QID) | ORAL | Status: DC | PRN

## 2021-05-07 MED ORDER — HEPARIN SODIUM (PORCINE) 5000 UNIT/ML IJ SOLN: 5000.0000 [IU] | Freq: Three times a day (TID) | INTRAMUSCULAR | Status: DC

## 2021-05-07 MED ORDER — ALBUTEROL SULFATE (2.5 MG/3ML) 0.083% IN NEBU
2.5000 mg | INHALATION_SOLUTION | RESPIRATORY_TRACT | Status: DC | PRN
  Administered 2021-05-08 – 2021-05-15 (×2): 2.5 mg via RESPIRATORY_TRACT
  Filled 2021-05-07 (×2): qty 3

## 2021-05-07 MED ORDER — LEVETIRACETAM 500 MG PO TABS
500.0000 mg | ORAL_TABLET | Freq: Every day | ORAL | Status: DC
  Administered 2021-05-07 – 2021-05-26 (×20): 500 mg via ORAL
  Filled 2021-05-07 (×20): qty 1

## 2021-05-07 MED ORDER — PENTAFLUOROPROP-TETRAFLUOROETH EX AERO: 1.0000 "application " | INHALATION_SPRAY | CUTANEOUS | Status: DC | PRN

## 2021-05-07 MED ORDER — ROSUVASTATIN CALCIUM 20 MG PO TABS
20.0000 mg | ORAL_TABLET | Freq: Every day | ORAL | Status: DC
  Administered 2021-05-08 – 2021-05-27 (×20): 20 mg via ORAL
  Filled 2021-05-07 (×20): qty 1

## 2021-05-07 MED ORDER — INSULIN GLARGINE 100 UNIT/ML ~~LOC~~ SOLN
5.0000 [IU] | Freq: Every day | SUBCUTANEOUS | Status: DC
  Administered 2021-05-07 – 2021-05-12 (×6): 5 [IU] via SUBCUTANEOUS
  Filled 2021-05-07 (×7): qty 0.05

## 2021-05-07 MED ORDER — HEPARIN SODIUM (PORCINE) 5000 UNIT/ML IJ SOLN
5000.0000 [IU] | Freq: Three times a day (TID) | INTRAMUSCULAR | Status: DC
  Administered 2021-05-08 – 2021-05-27 (×53): 5000 [IU] via SUBCUTANEOUS
  Filled 2021-05-07 (×55): qty 1

## 2021-05-07 MED ORDER — SENNOSIDES-DOCUSATE SODIUM 8.6-50 MG PO TABS: 1.0000 | ORAL_TABLET | Freq: Every evening | ORAL | Status: DC | PRN

## 2021-05-07 MED ORDER — PANTOPRAZOLE SODIUM 40 MG PO TBEC
40.0000 mg | DELAYED_RELEASE_TABLET | Freq: Every day | ORAL | Status: DC
  Administered 2021-05-08 – 2021-05-27 (×20): 40 mg via ORAL
  Filled 2021-05-07 (×20): qty 1

## 2021-05-07 MED ORDER — ASPIRIN EC 81 MG PO TBEC
81.0000 mg | DELAYED_RELEASE_TABLET | Freq: Every day | ORAL | Status: DC
  Administered 2021-05-08 – 2021-05-27 (×20): 81 mg via ORAL
  Filled 2021-05-07 (×20): qty 1

## 2021-05-07 MED ORDER — ACETAMINOPHEN 650 MG RE SUPP: 650.0000 mg | RECTAL | Status: DC | PRN

## 2021-05-07 MED ORDER — LIDOCAINE-PRILOCAINE 2.5-2.5 % EX CREA: 1.0000 "application " | TOPICAL_CREAM | CUTANEOUS | Status: DC | PRN

## 2021-05-07 MED ORDER — SENNOSIDES-DOCUSATE SODIUM 8.6-50 MG PO TABS
2.0000 | ORAL_TABLET | Freq: Every day | ORAL | Status: DC
  Administered 2021-05-07 – 2021-05-12 (×6): 2 via ORAL
  Filled 2021-05-07 (×6): qty 2

## 2021-05-07 MED ORDER — HYDROCORTISONE (PERIANAL) 2.5 % EX CREA
1.0000 "application " | TOPICAL_CREAM | Freq: Two times a day (BID) | CUTANEOUS | Status: DC | PRN
  Administered 2021-05-21 – 2021-05-22 (×2): 1 via RECTAL

## 2021-05-07 MED ORDER — B COMPLEX-C PO TABS
1.0000 | ORAL_TABLET | Freq: Every day | ORAL | Status: DC
  Administered 2021-05-08 – 2021-05-27 (×20): 1 via ORAL
  Filled 2021-05-07 (×20): qty 1

## 2021-05-07 MED ORDER — HEPARIN SODIUM (PORCINE) 1000 UNIT/ML IJ SOLN
INTRAMUSCULAR | Status: AC
  Filled 2021-05-07: qty 1

## 2021-05-07 MED ORDER — HEPARIN SODIUM (PORCINE) 1000 UNIT/ML DIALYSIS
1000.0000 [IU] | INTRAMUSCULAR | Status: DC | PRN
  Administered 2021-05-17: 1000 [IU] via INTRAVENOUS_CENTRAL
  Filled 2021-05-07 (×4): qty 1

## 2021-05-07 MED ORDER — ONDANSETRON HCL 4 MG PO TABS
4.0000 mg | ORAL_TABLET | Freq: Four times a day (QID) | ORAL | Status: DC | PRN
  Administered 2021-05-15 – 2021-05-25 (×5): 4 mg via ORAL
  Filled 2021-05-07 (×4): qty 1

## 2021-05-07 NOTE — Progress Notes (Signed)
Retta Diones, RN  Rehab Admission Coordinator  Nursing  Progress Notes     Signed  Date of Service:  05/07/2021 12:49 PM       Related encounter: Admission (Discharged) from 05/06/2021 in [place] 3W [facility]       Signed                       IP rehab admissions - Patient had been doing well in [place], however, yesterday, 05/06/21,  after dialysis patient was found to have new onset of AMS, oriented to self only and with mumbling speech, in conjunction with worsened left sided weakness, left hemineglect and rightward gaze deviation.  CT no acute abnormality, CT head and neck no LVO.  CT perfusion not successful.  MRI today showed no acute infarct, evolving prior right CR and BG infarct.  However, EEG showed right central parietal region spikes, consistent with potential epileptogenic focus.  Patient to start [place] 500 nightly for seizure treatment and prevention in ESRD patient.  I spoke with attending MD and patient is cleared to readmit to CIR today after HD is completed.  This is considered an interrupted stay.  Call me for questions.  #[phone]           Note Details  Author Retta Diones, RN File Time 05/07/2021 12:56 PM  Author Type Rehab Admission Coordinator Status Signed  Last Editor Retta Diones, [place] # [id number] Admit Date 05/07/2021

## 2021-05-07 NOTE — Consult Note (Signed)
   [facility] CM Inpatient Consult   05/07/2021  Carolyn Russo 1951-08-26 [id number]  [place] [facility] [[facility]] Patient: Medicare CMS DCE   Patient screened for re-hospitalization to acute currently in observation from inpatient rehab.   Review of patient's medical record reveals patient returned to acute hospital for altered level of consciousness with left sided weakness recent CVA. Primary Care Provider:  [name], MD [facility] an Embedded provider with a chronic care management program.  Plan:  Continue to follow progress and disposition to assess for post hospital care management needs.    For questions contact:   Natividad Brood, RN BSN [place] [facility] Liaison  [phone] business mobile phone Toll free office [phone]  Fax number: [phone] [email or web address]

## 2021-05-07 NOTE — Progress Notes (Signed)
Patient arrived on unit, oriented to unit. Reviewed medications, therapy schedule, rehab routine and plan of care. States an understanding of information reviewed. No complications noted at this time. Patient reports no pain and is AX3-4, r sided weakness, HD cath in r subclavian. Fistula in R upper arm. 18g in AC and 20g in LU arm not flushing, nurse removed. Tip intact. IV sites CDI. Dinner tray ordered to be sent to new room on 4W room 3. CHG bath given by NT while nurse present.  Ebony Cargo.

## 2021-05-07 NOTE — Progress Notes (Addendum)
STROKE TEAM PROGRESS NOTE   ATTENDING NOTE: I reviewed above note and agree with the assessment and plan. Pt was seen and examined.   No acute event overnight, neuro stable, status post dialysis, no issues this time.  Patient symptoms for this admission likely due to seizure, on Keppra 500 nightly, tolerating well.  Likely discharge back to CIR today, will sign off.  For detailed assessment and plan, please refer to above as I have made changes wherever appropriate.   Neurology will sign off. Please call with questions. Pt will follow up with stroke clinic NP at [facility] in about 4 weeks. Thanks for the consult.   Rosalin Hawking, MD PhD Stroke Neurology 05/07/2021 6:05 PM    INTERVAL HISTORY Husband at the bedside. HD went smooth today; took less off. No return of spells. Tol keppra. Updated husband at bedside.  Vitals:   05/07/21 1130 05/07/21 1200 05/07/21 1230 05/07/21 1300  BP: 115/61 137/67 137/65 119/65  Pulse: 71 70 73 72  Resp: 20 14 14 11   Temp:      TempSrc:      SpO2:      Weight:       CBC:  Recent Labs  Lab 05/01/21 0553 05/03/21 1044 05/05/21 1131 05/06/21 0456 05/07/21 0729  WBC 13.0* 12.8*   < > 8.8 12.1*  NEUTROABS 6.9 7.9*  --   --   --   HGB 9.8* 9.7*   < > 10.6* 10.6*  HCT 28.4* 29.5*   < > 31.0* 31.2*  MCV 86.1 88.6   < > 88.6 87.4  PLT 238 299   < > 235 370   < > = values in this interval not displayed.    Basic Metabolic Panel:  Recent Labs  Lab 05/05/21 1131 05/07/21 0729  NA 132* 135  K 4.2 4.2  CL 94* 99  CO2 30 27  GLUCOSE 123* 156*  BUN 14 22  CREATININE 5.78* 6.99*  CALCIUM 9.5 8.8*  PHOS 5.1* 4.8*    Lipid Panel: No results for input(s): CHOL, TRIG, HDL, CHOLHDL, VLDL, LDLCALC in the last 168 hours. NO reason to repeat since this was just done! HgbA1c: No results for input(s): HGBA1C in the last 168 hours. Urine Drug Screen: No results for input(s): LABOPIA, COCAINSCRNUR, LABBENZ, AMPHETMU, THCU, LABBARB in the last 168 hours.   Alcohol Level No results for input(s): ETH in the last 168 hours.  IMAGING past 24 hours EEG adult  Result Date: 05/06/2021 [name], MD     05/06/2021  4:53 PM Patient Name: Carolyn Russo MRN: [id number] Epilepsy Attending: [name] Referring Physician/Provider: Elmyra Ricks, NP Date: 05/06/2021 Duration: 27.06 mins Patient history: 70yo F with recent stroke presented with worsening of previous stroke symptoms during HD. EEG to evaluate for seizure. Level of alertness: Awake AEDs during EEG study: None Technical aspects: This EEG study was done with scalp electrodes positioned according to the 10-20 International system of electrode placement. Electrical activity was acquired at a sampling rate of 500Hz  and reviewed with a high frequency filter of 70Hz  and a low frequency filter of 1Hz . EEG data were recorded continuously and digitally stored. Description: The posterior dominant rhythm consists of 9 Hz activity of moderate voltage (25-35 uV) seen predominantly in posterior head regions, symmetric and reactive to eye opening and eye closing. EEG showed continuous sharply contoured 2-3Hz  delta slowing in right hemisphere as well as intermittent 3-6hz  theta-delta slowing. Spikes were also noted in right centro-parietal  region. Hyperventilation and photic stimulation were not performed.   ABNORMALITY -Spike, right centro-parietal region. - Continuous slow, right hemisphere - Intermittent slow, generalized IMPRESSION: This study showed evidence of potential epileptogenicity arising from right centro-parietal region. There is also cortical dysfunction arising from right hemisphere likely secondary to underlying stroke. Additionally, there is mild diffuse encephalopathy, nonspecific etiology. No seizures were seen throughout the recording. Avis    PHYSICAL EXAM General: Appears well-developed; no distress. Psych: Affect appropriate to situation Eyes: No scleral  injection HENT: No OP obstrucion Head: Normocephalic.  Cardiovascular: Normal rate and regular rhythm.  Respiratory: Effort normal and breath sounds normal to anterior ascultation GI: Soft.  No distension. There is no tenderness.  Skin: WDI    Neurological Examination Mental Status: Alert, oriented, thought content appropriate.  Speech fluent without evidence of aphasia. Able to follow 3 step commands without difficulty. Cranial Nerves: II: Visual fields grossly normal, III,IV, VI: ptosis not present, extra-ocular motions intact bilaterally, pupils equal, round, reactive to light and accommodation V,VII: Left facial droop. slight lack of sensation on left side of face.  VIII: hearing normal bilaterally IX,X: uvula rises symmetrically XI: decreased shoulder shrug on left- c/o shoulder pain XII: midline tongue extension Motor:Tone and bulk:normal tone throughout; no atrophy noted. Rt is 5/5. Left: 4/5 arm, 4/5 leg Sensory: States she feels equal to light touch bilaterally, no extinction Deep Tendon Reflexes: 2+ and symmetric throughout Cerebellar: not out of proportion to weakness Gait: normal gait and station   ASSESSMENT/PLAN [name] is a 70 y.o. female with history of recent stroke and d/c on 6/28 to CIR who presented again to our service as a "CODE STROKE" with worsening of previous stroke symptoms and AMS just after HD on 7/13. She is now back to baseline (post stroke). MRI neg for new ischemia. Most likely she decompensated acutely in setting of HD, ddx post stroke seizures. Will ck EEG.  No reason to repeat previous stroke work up. Recommend return to CIR ASAP.   Reviewed EEG findings w/pt & husband: This study showed evidence of potential epileptogenicity arising from right centro-parietal region. There is also cortical dysfunction arising from right hemisphere likely secondary to underlying stroke. Continue renal dosed Keppr 500mg  QHS to start tonight.  She already  has outpt neuro f/u planned. She is ready to return to CIR from neuro stand point. We will s/o.  Hospital day # 0  Desiree Metzger-Cihelka, ARNP-C, ANVP-BC Pager: [phone]   To contact Stroke Continuity provider, please refer to [email or web address] After hours, contact General Neurology

## 2021-05-07 NOTE — Progress Notes (Signed)
Inpatient Rehabilitation  Patient resuming Inpatient Rehabilitation Program following an interruption in stay from 05/06/2021 -05/07/2021.   Patient information will continue to be reviewed and entered into eRehab system by [facility]. Loni Beckwith., CCC/SLP, PPS Coordinator.  Information including medical coding, functional ability, and quality indicators will be reviewed and updated through discharge.

## 2021-05-07 NOTE — Progress Notes (Signed)
IP rehab admissions - I spoke with attending MD.  Patient is cleared for re-admit to inpatient rehab later today after HD completed.  We do have inpatient rehab beds today.  This will be considered an interrupted stay for inpatient rehab.  Call for questions.  [phone]

## 2021-05-07 NOTE — Progress Notes (Signed)
Inpatient Rehabilitation Medication Review by a Pharmacist  A complete drug regimen review was completed for this patient to identify any potential clinically significant medication issues.  Clinically significant medication issues were identified:  no  Check AMION for pharmacist assigned to patient if future medication questions/issues arise during this admission.  Pharmacist comments:   Time spent performing this drug regimen review (minutes):  5 minutes   Carolyn Russo 05/07/2021 7:03 PM

## 2021-05-07 NOTE — Progress Notes (Signed)
Note not officially done  see MD documentation

## 2021-05-07 NOTE — Progress Notes (Addendum)
PROGRESS NOTE   Subjective/Complaints:  Pt returns to rehab today after events of 05/06/21 (see below). Pt reports on going weakness on the left with hypersensitivity at times involving the left lower extremity. She's anxious to resume her rehab here so that she can return home  ROS: Patient denies fever, rash, sore throat, blurred vision, nausea, vomiting, diarrhea, cough, shortness of breath or chest pain, headache, or mood change.   Objective:   CT ANGIO HEAD W OR WO CONTRAST  Result Date: 05/05/2021 CLINICAL DATA:  Initial evaluation for acute stroke, TIA. EXAM: CT ANGIOGRAPHY HEAD AND NECK CT PERFUSION BRAIN TECHNIQUE: Multidetector CT imaging of the head and neck was performed using the standard protocol during bolus administration of intravenous contrast. Multiplanar CT image reconstructions and MIPs were obtained to evaluate the vascular anatomy. Carotid stenosis measurements (when applicable) are obtained utilizing NASCET criteria, using the distal internal carotid diameter as the denominator. Multiphase CT imaging of the brain was performed following IV bolus contrast injection. Subsequent parametric perfusion maps were calculated using RAPID software. CONTRAST:  126mL OMNIPAQUE IOHEXOL 350 MG/ML SOLN COMPARISON:  Prior head CT from earlier the same day as well as previous MRA from 04/17/2021. FINDINGS: CTA NECK FINDINGS Aortic arch: Visualized aortic arch normal in caliber with normal 3 vessel morphology. No hemodynamically significant stenosis seen about the origin of the great vessels. Right carotid system: Right common and internal carotid arteries patent without stenosis, dissection or occlusion. Minimal atheromatous plaque about the proximal cervical right ICA without stenosis. Left carotid system: Left common and internal carotid arteries patent without stenosis, dissection or occlusion. Vertebral arteries: Both vertebral arteries  arise from subclavian arteries. No proximal subclavian artery stenosis. Vertebral arteries diminutive but widely patent within the neck without stenosis, dissection or occlusion. Skeleton: No visible acute osseous finding. No discrete or worrisome osseous lesions. Other neck: No other acute soft tissue abnormality within the neck. 7 mm right thyroid nodule noted, of doubtful significance given size and patient age, no follow-up imaging recommended (ref: J Am Coll Radiol. 2015 Feb;12(2): 143-50). Right-sided hemodialysis catheter in place. Upper chest: Visualized upper chest demonstrates no acute finding. Review of the MIP images confirms the above findings CTA HEAD FINDINGS Anterior circulation: Petrous segments patent bilaterally. Mild atheromatous change within the carotid siphons without hemodynamically significant stenosis. No made of a subtle 2 mm focal outpouching arising from the proximal cavernous right ICA, which could reflect a small aneurysm versus vascular infundibulum (series 7, image 254). A1 segments patent bilaterally. Normal anterior communicating artery complex. Anterior cerebral arteries patent to their distal aspects without stenosis. No M1 stenosis or occlusion. Normal MCA bifurcations. Distal MCA branches well perfused and symmetric. Posterior circulation: Both V4 segments patent to the vertebrobasilar junction without stenosis. Both PICA origins patent and normal. Basilar patent to its distal aspect without stenosis. Superior cerebellar arteries patent bilaterally. Both PCA supplied via the basilar as well as prominent bilateral posterior communicating arteries. Atheromatous irregularity within the bilateral P2 segments with associated mild to moderate multifocal narrowing. PCAs remain patent to their distal aspects. Venous sinuses: Grossly patent allowing for timing the contrast bolus. Anatomic variants: None significant. Review of the MIP images confirms  the above findings CT Brain Perfusion  Findings: ASPECTS: Not calculated. The CT perfusion portion of this exam unfortunately failed due to technical difficulty. No perfusion maps were generated. IMPRESSION: 1. Negative CTA for emergent large vessel occlusion. 2. Mild atheromatous change about the major arterial vasculature of the head and neck as above. No proximal high-grade or correctable stenosis. 3. 2 mm focal outpouching arising from the proximal cavernous right ICA, which could reflect a small aneurysm versus vascular infundibulum. Attention at follow-up recommended. 4. CT perfusion portion of this exam unfortunately failed due to technical difficulty. No perfusion maps were generated. Critical Value/emergent results were called by telephone at the time of interpretation on 05/05/2021 at 8:15 pm to provider ERIC [facility], who verbally acknowledged these results. Electronically Signed   By: Jeannine Boga M.D.   On: 05/05/2021 21:04   CT HEAD WO CONTRAST  Result Date: 05/05/2021 CLINICAL DATA:  70 year old female with neurologic deficit. EXAM: CT HEAD WITHOUT CONTRAST TECHNIQUE: Contiguous axial images were obtained from the base of the skull through the vertex without intravenous contrast. COMPARISON:  Head CT dated 05/03/2021. FINDINGS: Brain: Mild age-related atrophy and moderate chronic microvascular ischemic changes. An area of infarct again noted lateral to the right thalamus, seen on the prior CT. There is no acute intracranial hemorrhage. No mass effect or midline shift. No extra-axial fluid collection. Vascular: No hyperdense vessel or unexpected calcification. Skull: Normal. Negative for fracture or focal lesion. Sinuses/Orbits: The visualized paranasal sinuses and mastoid air cells are clear. Bilateral maxillary sinus surgeries. Other: None IMPRESSION: 1. No acute intracranial pathology. 2. Age-related atrophy and chronic microvascular ischemic changes. Similar appearance of focal area of old infarct lateral to the right  thalamus. Electronically Signed   By: Anner Crete M.D.   On: 05/05/2021 19:56   CT ANGIO NECK W OR WO CONTRAST  Result Date: 05/05/2021 CLINICAL DATA:  Initial evaluation for acute stroke, TIA. EXAM: CT ANGIOGRAPHY HEAD AND NECK CT PERFUSION BRAIN TECHNIQUE: Multidetector CT imaging of the head and neck was performed using the standard protocol during bolus administration of intravenous contrast. Multiplanar CT image reconstructions and MIPs were obtained to evaluate the vascular anatomy. Carotid stenosis measurements (when applicable) are obtained utilizing NASCET criteria, using the distal internal carotid diameter as the denominator. Multiphase CT imaging of the brain was performed following IV bolus contrast injection. Subsequent parametric perfusion maps were calculated using RAPID software. CONTRAST:  148mL OMNIPAQUE IOHEXOL 350 MG/ML SOLN COMPARISON:  Prior head CT from earlier the same day as well as previous MRA from 04/17/2021. FINDINGS: CTA NECK FINDINGS Aortic arch: Visualized aortic arch normal in caliber with normal 3 vessel morphology. No hemodynamically significant stenosis seen about the origin of the great vessels. Right carotid system: Right common and internal carotid arteries patent without stenosis, dissection or occlusion. Minimal atheromatous plaque about the proximal cervical right ICA without stenosis. Left carotid system: Left common and internal carotid arteries patent without stenosis, dissection or occlusion. Vertebral arteries: Both vertebral arteries arise from subclavian arteries. No proximal subclavian artery stenosis. Vertebral arteries diminutive but widely patent within the neck without stenosis, dissection or occlusion. Skeleton: No visible acute osseous finding. No discrete or worrisome osseous lesions. Other neck: No other acute soft tissue abnormality within the neck. 7 mm right thyroid nodule noted, of doubtful significance given size and patient age, no follow-up  imaging recommended (ref: J Am Coll Radiol. 2015 Feb;12(2): 143-50). Right-sided hemodialysis catheter in place. Upper chest: Visualized upper chest demonstrates no acute finding.  Review of the MIP images confirms the above findings CTA HEAD FINDINGS Anterior circulation: Petrous segments patent bilaterally. Mild atheromatous change within the carotid siphons without hemodynamically significant stenosis. No made of a subtle 2 mm focal outpouching arising from the proximal cavernous right ICA, which could reflect a small aneurysm versus vascular infundibulum (series 7, image 254). A1 segments patent bilaterally. Normal anterior communicating artery complex. Anterior cerebral arteries patent to their distal aspects without stenosis. No M1 stenosis or occlusion. Normal MCA bifurcations. Distal MCA branches well perfused and symmetric. Posterior circulation: Both V4 segments patent to the vertebrobasilar junction without stenosis. Both PICA origins patent and normal. Basilar patent to its distal aspect without stenosis. Superior cerebellar arteries patent bilaterally. Both PCA supplied via the basilar as well as prominent bilateral posterior communicating arteries. Atheromatous irregularity within the bilateral P2 segments with associated mild to moderate multifocal narrowing. PCAs remain patent to their distal aspects. Venous sinuses: Grossly patent allowing for timing the contrast bolus. Anatomic variants: None significant. Review of the MIP images confirms the above findings CT Brain Perfusion Findings: ASPECTS: Not calculated. The CT perfusion portion of this exam unfortunately failed due to technical difficulty. No perfusion maps were generated. IMPRESSION: 1. Negative CTA for emergent large vessel occlusion. 2. Mild atheromatous change about the major arterial vasculature of the head and neck as above. No proximal high-grade or correctable stenosis. 3. 2 mm focal outpouching arising from the proximal cavernous  right ICA, which could reflect a small aneurysm versus vascular infundibulum. Attention at follow-up recommended. 4. CT perfusion portion of this exam unfortunately failed due to technical difficulty. No perfusion maps were generated. Critical Value/emergent results were called by telephone at the time of interpretation on 05/05/2021 at 8:15 pm to provider ERIC [facility], who verbally acknowledged these results. Electronically Signed   By: Jeannine Boga M.D.   On: 05/05/2021 21:04   MR BRAIN WO CONTRAST  Result Date: 05/06/2021 CLINICAL DATA:  Left-sided weakness EXAM: MRI HEAD WITHOUT CONTRAST TECHNIQUE: Multiplanar, multiecho pulse sequences of the brain and surrounding structures were obtained without intravenous contrast. COMPARISON:  04/16/2021 FINDINGS: Significant motion artifact is present. Brain: There is persistent diffusion hyperintensity with decreased ADC hypointensity involving the right corona radiata and basal ganglia. No new abnormal diffusion. There is no acute infarction or intracranial hemorrhage. There is no intracranial mass, mass effect, or edema. There is no hydrocephalus or extra-axial fluid collection. Prominence of the ventricles and sulci reflects stable parenchymal volume loss. Patchy and confluent areas of T2 hyperintensity in the supratentorial and pontine white matter are nonspecific but probably reflects stable chronic microvascular ischemic changes. Very small chronic right cerebellar infarct is noted. Vascular: Major vessel flow voids at the skull base are preserved. Skull and upper cervical spine: Normal marrow signal is grossly preserved. Sinuses/Orbits: Paranasal sinuses are aerated. Bilateral lens replacements. Other: Sella is unremarkable.  Mastoid air cells are clear. IMPRESSION: No acute infarction. Evolving prior infarction of the right corona radiata and basal ganglia. Chronic microvascular ischemic changes. Electronically Signed   By: Macy Mis M.D.   On:  05/06/2021 10:40   CT CEREBRAL PERFUSION W CONTRAST  Result Date: 05/05/2021 CLINICAL DATA:  Initial evaluation for acute stroke, TIA. EXAM: CT ANGIOGRAPHY HEAD AND NECK CT PERFUSION BRAIN TECHNIQUE: Multidetector CT imaging of the head and neck was performed using the standard protocol during bolus administration of intravenous contrast. Multiplanar CT image reconstructions and MIPs were obtained to evaluate the vascular anatomy. Carotid stenosis measurements (when applicable) are obtained  utilizing NASCET criteria, using the distal internal carotid diameter as the denominator. Multiphase CT imaging of the brain was performed following IV bolus contrast injection. Subsequent parametric perfusion maps were calculated using RAPID software. CONTRAST:  115mL OMNIPAQUE IOHEXOL 350 MG/ML SOLN COMPARISON:  Prior head CT from earlier the same day as well as previous MRA from 04/17/2021. FINDINGS: CTA NECK FINDINGS Aortic arch: Visualized aortic arch normal in caliber with normal 3 vessel morphology. No hemodynamically significant stenosis seen about the origin of the great vessels. Right carotid system: Right common and internal carotid arteries patent without stenosis, dissection or occlusion. Minimal atheromatous plaque about the proximal cervical right ICA without stenosis. Left carotid system: Left common and internal carotid arteries patent without stenosis, dissection or occlusion. Vertebral arteries: Both vertebral arteries arise from subclavian arteries. No proximal subclavian artery stenosis. Vertebral arteries diminutive but widely patent within the neck without stenosis, dissection or occlusion. Skeleton: No visible acute osseous finding. No discrete or worrisome osseous lesions. Other neck: No other acute soft tissue abnormality within the neck. 7 mm right thyroid nodule noted, of doubtful significance given size and patient age, no follow-up imaging recommended (ref: J Am Coll Radiol. 2015 Feb;12(2):  143-50). Right-sided hemodialysis catheter in place. Upper chest: Visualized upper chest demonstrates no acute finding. Review of the MIP images confirms the above findings CTA HEAD FINDINGS Anterior circulation: Petrous segments patent bilaterally. Mild atheromatous change within the carotid siphons without hemodynamically significant stenosis. No made of a subtle 2 mm focal outpouching arising from the proximal cavernous right ICA, which could reflect a small aneurysm versus vascular infundibulum (series 7, image 254). A1 segments patent bilaterally. Normal anterior communicating artery complex. Anterior cerebral arteries patent to their distal aspects without stenosis. No M1 stenosis or occlusion. Normal MCA bifurcations. Distal MCA branches well perfused and symmetric. Posterior circulation: Both V4 segments patent to the vertebrobasilar junction without stenosis. Both PICA origins patent and normal. Basilar patent to its distal aspect without stenosis. Superior cerebellar arteries patent bilaterally. Both PCA supplied via the basilar as well as prominent bilateral posterior communicating arteries. Atheromatous irregularity within the bilateral P2 segments with associated mild to moderate multifocal narrowing. PCAs remain patent to their distal aspects. Venous sinuses: Grossly patent allowing for timing the contrast bolus. Anatomic variants: None significant. Review of the MIP images confirms the above findings CT Brain Perfusion Findings: ASPECTS: Not calculated. The CT perfusion portion of this exam unfortunately failed due to technical difficulty. No perfusion maps were generated. IMPRESSION: 1. Negative CTA for emergent large vessel occlusion. 2. Mild atheromatous change about the major arterial vasculature of the head and neck as above. No proximal high-grade or correctable stenosis. 3. 2 mm focal outpouching arising from the proximal cavernous right ICA, which could reflect a small aneurysm versus vascular  infundibulum. Attention at follow-up recommended. 4. CT perfusion portion of this exam unfortunately failed due to technical difficulty. No perfusion maps were generated. Critical Value/emergent results were called by telephone at the time of interpretation on 05/05/2021 at 8:15 pm to provider ERIC [facility], who verbally acknowledged these results. Electronically Signed   By: Jeannine Boga M.D.   On: 05/05/2021 21:04   DG Chest Port 1 View  Result Date: 05/05/2021 CLINICAL DATA:  Shortness of breath EXAM: PORTABLE CHEST 1 VIEW COMPARISON:  05/03/2021 FINDINGS: Cardiac shadow is mildly enlarged but stable. Right jugular central line is again seen deep in the right atrium but stable. Lungs are well aerated bilaterally with patchy stable atelectasis/scarring on the  left. No acute abnormality is seen. IMPRESSION: No acute abnormality noted. Stable changes in the left base are seen. Electronically Signed   By: Inez Catalina M.D.   On: 05/05/2021 19:08   EEG adult  Result Date: 05/06/2021 [name], MD     05/06/2021  4:53 PM Patient Name: Carolyn Russo MRN: [id number] Epilepsy Attending: [name] Referring Physician/Provider: Elmyra Ricks, NP Date: 05/06/2021 Duration: 27.06 mins Patient history: 70yo F with recent stroke presented with worsening of previous stroke symptoms during HD. EEG to evaluate for seizure. Level of alertness: Awake AEDs during EEG study: None Technical aspects: This EEG study was done with scalp electrodes positioned according to the 10-20 International system of electrode placement. Electrical activity was acquired at a sampling rate of 500Hz  and reviewed with a high frequency filter of 70Hz  and a low frequency filter of 1Hz . EEG data were recorded continuously and digitally stored. Description: The posterior dominant rhythm consists of 9 Hz activity of moderate voltage (25-35 uV) seen predominantly in posterior head regions, symmetric and reactive to eye  opening and eye closing. EEG showed continuous sharply contoured 2-3Hz  delta slowing in right hemisphere as well as intermittent 3-6hz  theta-delta slowing. Spikes were also noted in right centro-parietal region. Hyperventilation and photic stimulation were not performed.   ABNORMALITY -Spike, right centro-parietal region. - Continuous slow, right hemisphere - Intermittent slow, generalized IMPRESSION: This study showed evidence of potential epileptogenicity arising from right centro-parietal region. There is also cortical dysfunction arising from right hemisphere likely secondary to underlying stroke. Additionally, there is mild diffuse encephalopathy, nonspecific etiology. No seizures were seen throughout the recording. [name]   Recent Labs    05/06/21 0456 05/07/21 0729  WBC 8.8 12.1*  HGB 10.6* 10.6*  HCT 31.0* 31.2*  PLT 235 370    Recent Labs    05/05/21 1131 05/07/21 0729  NA 132* 135  K 4.2 4.2  CL 94* 99  CO2 30 27  GLUCOSE 123* 156*  BUN 14 22  CREATININE 5.78* 6.99*  CALCIUM 9.5 8.8*     No intake or output data in the 24 hours ending 05/07/21 1618       Physical Exam: Vital Signs There were no vitals taken for this visit.    Constitutional: No distress . Vital signs reviewed. HEENT: EOMI, oral membranes moist Neck: supple Cardiovascular: RRR without murmur. No JVD    Respiratory/Chest: CTA Bilaterally without wheezes or rales. Normal effort    GI/Abdomen: BS +, non-tender, non-distended Ext: no clubbing, cyanosis, or edema Psych: pleasant and cooperative  Musculoskeletal:        minimal pain with rom in ue's. Left ankle sensitive to PROM, has bp cuff in place there as well Skin:    General: Skin is warm and dry. Neurological:    Comments: alert, follows commands.  A little slow to process. Is HOH. Fair insight and awareness.  Provided biographical information. Normal language, speech remains slightly dysarthric. Mild left inattention. Mild left  central 7. LUE   2+/5 deltoid, 3/5 biceps, triceps and 3+/5 wrist and HI. LLE 3-4/5 prox to distal. RUE and RLE 4+ to 5/5 prox to distal.    Assessment/Plan: 1. Functional deficits which require 3+ hours per day of interdisciplinary therapy in a comprehensive inpatient rehab setting. Physiatrist is providing close team supervision and 24 hour management of active medical problems listed below. Physiatrist and rehab team continue to assess barriers to discharge/monitor patient progress toward functional and medical goals  Care Tool:  Bathing              Bathing assist       Upper Body Dressing/Undressing Upper body dressing   What is the patient wearing?: Hospital gown only    Upper body assist Assist Level: Minimal Assistance - Patient > 75%    Lower Body Dressing/Undressing Lower body dressing      What is the patient wearing?: Incontinence brief     Lower body assist Assist for lower body dressing: Minimal Assistance - Patient > 75%     Toileting Toileting    Toileting assist Assist for toileting: Moderate Assistance - Patient 50 - 74%     Transfers Chair/bed transfer  Transfers assist     Chair/bed transfer assist level: Moderate Assistance - Patient 50 - 74%     Locomotion Ambulation   Ambulation assist              Walk 10 feet activity   Assist           Walk 50 feet activity   Assist           Walk 150 feet activity   Assist           Walk 10 feet on uneven surface  activity   Assist           Wheelchair     Assist               Wheelchair 50 feet with 2 turns activity    Assist            Wheelchair 150 feet activity     Assist          There were no vitals taken for this visit.  Medical Problem List and Plan: 1.  Left side weakness secondary to nonhemorrhagic right basal ganglia infarction. Course complicated by seizure after hemodialysis on the evening of 7/13 for which  she was transferred to the acute hospital for closer monitoring. Pt returns to inpatient rehab today 7/15 as an interrupted stay.              -patient may shower             -ELOS/Goals: 10-12 days, mod I goals for PT, OT, SLP  -con't PT, OT and SLP 2.  Impaired mobility: -DVT/anticoagulation:  continue SCD             -antiplatelet therapy:ASA and plavix    .  3. Right hip bursitis, recent left shoulder pain: resume Tramadol 50 mg every 12 hours as needed, ice, ROM, lidocaine patch 4. Anxiety: continue Xanax 0.5 mg 3 times daily as needed.  Provide emotional support             -antipsychotic agents: N/A 5. Neuropsych: This patient seems to be capable of making decisions on her own behalf. 6. Skin/Wound Care: Routine skin checks 7. Fluids/Electrolytes/Nutrition: Routine in and outs with follow-up chemistries 8.  Acute lower GI bleed with blood loss anemia.  Follow-up GI services.  Resume ASA as above. - Continue Protonix 40 mg twice daily 7/15 hgb 10.6, no signs of gross bleeding 9.  Chronic diastolic congestive heart failure.  Monitor for any signs of fluid overload.  Continue Lasix 40-80 mg Monday Wednesday Friday             -need daily weights   There were no vitals filed for this visit.  51.  Newly Dx'd End-stage renal disease.  Hemodialysis as per renal services. HD to occur at the end of the day to allow for participation in therapies prior. 11.  Hypertension.  3.125 bid with meals    7/15 bp borderline --titrate as needed 12.  Asthma.     -pt denies sob today. -encourage use of IS, OOB -proventil, dulera bid -monitor O2 sats and apply oxygen if needed 13.Hyperlipidemia.Crestor 14.Obesity.BMI 34.31.Dietery follow up 15.  Diabetes mellitus.hemoglobin AIc 5.2.  CBGs labile, elevated  -sugars running low the day of transfer -will resume low dose lantus 5u daily to start -ssi coverage   16. Leukocytosis: afebrile  7/15 wbc's 12.1 today.   -check with HD 17. Constipation:  recent KUB shows moderate stool burden.  -no bm since 7/11 -sorbitol today -add senna-s at HS  18. Seizure post HD on 7/13  -now on keppra 500mg  qhs  -seizure precautions     LOS: 0 days A FACE TO FACE EVALUATION WAS PERFORMED  Meredith Staggers 05/07/2021, 4:18 PM

## 2021-05-07 NOTE — Progress Notes (Signed)
[facility] Progress Note   Subjective:   Patient seen at HD, no new c/o's.  BP dropped earlier in HD w/o any UF attempt so IVF boluses were given and BP's corrected.   Objective Vitals:   05/07/21 1130 05/07/21 1200 05/07/21 1230 05/07/21 1300  BP: 115/61 137/67 137/65 119/65  Pulse: 71 70 73 72  Resp: 20 14 14 11   Temp:      TempSrc:      SpO2:      Weight:       Physical Exam General:well appearing female in NAD Heart:RRR, no mrg Lungs:CTAB, nml WOB Abdomen:soft, NTND Extremities:no LE edema Dialysis Access: TDC, RU AVF maturing +b/t   Filed Weights   05/07/21 0930  Weight: 80 kg    Intake/Output Summary (Last 24 hours) at 05/07/2021 1457 Last data filed at 05/07/2021 1305 Gross per 24 hour  Intake 120 ml  Output -500 ml  Net 620 ml    Additional Objective Labs: Basic Metabolic Panel: Recent Labs  Lab 05/03/21 1044 05/05/21 1131 05/07/21 0729  NA 131* 132* 135  K 4.8 4.2 4.2  CL 97* 94* 99  CO2 25 30 27   GLUCOSE 306* 123* 156*  BUN 21 14 22   CREATININE 5.90* 5.78* 6.99*  CALCIUM 8.6* 9.5 8.8*  PHOS 4.8* 5.1* 4.8*    Liver Function Tests: Recent Labs  Lab 05/03/21 1044 05/05/21 1131 05/07/21 0729  ALBUMIN 2.8* 3.3* 3.0*    CBC: Recent Labs  Lab 05/01/21 0553 05/03/21 1044 05/05/21 1131 05/06/21 0456 05/07/21 0729  WBC 13.0* 12.8* 10.1 8.8 12.1*  NEUTROABS 6.9 7.9*  --   --   --   HGB 9.8* 9.7* 11.1* 10.6* 10.6*  HCT 28.4* 29.5* 33.2* 31.0* 31.2*  MCV 86.1 88.6 87.4 88.6 87.4  PLT 238 299 391 235 370    CBG: Recent Labs  Lab 05/06/21 0654 05/06/21 1111 05/06/21 1637 05/06/21 2148 05/07/21 0641  GLUCAP 72 82 221* 291* 173*   Medications:   aspirin EC  81 mg Oral Daily   B-complex with vitamin C  1 tablet Oral Daily   Chlorhexidine Gluconate Cloth  6 each Topical Q0600   cholecalciferol  2,000 Units Oral Daily   clopidogrel  75 mg Oral Daily   heparin  5,000 Units Subcutaneous Q8H   heparin sodium  (porcine)       insulin aspart  0-5 Units Subcutaneous QHS   insulin aspart  0-6 Units Subcutaneous TID WC   levETIRAcetam  500 mg Oral QHS   mometasone-formoterol  2 puff Inhalation BID   pantoprazole  40 mg Oral Daily   rosuvastatin  20 mg Oral Daily   sevelamer carbonate  800 mg Oral TID WC    Dialysis Orders: [facility]MWF 4 hrs 180NRe 400/Autoflow 1.5 86.5 kg 2.0K/2.0 Ca TDC -heparin 2000 units IV TIW -Mircera 100 mcg IV q 2 weeks (last dose 04/14/2021 -Hectorol 5 mcg IV TIW   Assessment/Plan: Acute subcortical R CVA-per Primary/Neurology. Admitted back to hospital yesterday due to AMS/stroke like symptoms.  Neurology reassessed and determined no acute stroke, but possibly watershed issue (I.e, hypotension related).  Recommended return to CIR ASAP.  Acute LGIB- Hx hemorrhoidal bleeding in past. new onset 06/25 post clopidogrel load. s/p 2 units PRBCS on 04/18/21.  Clopidogrel and ASA on hold. No heparin with HD. Per GI: EGD and flex sig with no etiology and bleeding scan (-).  Hgb today 10.6 ESRD -  MWF schedule as an outpatient.  Off heparin  d/t new GIB. 1st stage R basilic vein transposition on 7/8 by Dr. Scot Dock.  Signing off early, encouraged compliance.  AMS following HD yesterday.  Will lower goal for tomorrow.   Hypertension/volume - BP's are good, no BP lowering meds at this time. Pt was down to 78kg , lowest weight here, after HD Wed. Suspect hypovolemia contributing to her episode on Wed.  No fluid off today w/ HD, will keep + w/ NS boluses as needed, and let volume come up a few kg.  Anemia CKD - As noted in # 2. aranesp 183mcg weekly - changed to [place]. Transfuse PRN. Metabolic bone disease -  PTH only 110 03/26/21 on 5 mcg Hectorol, hectorol was held at that time.  Rechecked pth 128. No need to restart VDRA.  Changed renvela to powder d/t patient inability to swallow pills.  Nutrition - note dysphagia diet DTM2- per primary    Kelly Splinter  MD 05/07/2021, 3:00 PM

## 2021-05-07 NOTE — Progress Notes (Signed)
IP rehab admissions - Patient had been doing well in CIR, however, yesterday, 05/06/21,  after dialysis patient was found to have new onset of AMS, oriented to self only and with mumbling speech, in conjunction with worsened left sided weakness, left hemineglect and rightward gaze deviation.  CT no acute abnormality, CT head and neck no LVO.  CT perfusion not successful.  MRI today showed no acute infarct, evolving prior right CR and BG infarct.  However, EEG showed right central parietal region spikes, consistent with potential epileptogenic focus.  Patient to start [place] 500 nightly for seizure treatment and prevention in ESRD patient.  I spoke with attending MD and patient is cleared to readmit to CIR today after HD is completed.  This is considered an interrupted stay.  Call me for questions.  [phone]

## 2021-05-07 NOTE — Care Management Obs Status (Signed)
[facility] NOTIFICATION   Patient Details  Name: SHAQUELLA STAMANT MRN: [id number] Date of Birth: 03-Feb-1951   Medicare Observation Status Notification Given:  Yes    Geralynn Ochs, [place] 05/07/2021, 2:01 PM

## 2021-05-07 NOTE — Discharge Summary (Signed)
Physician Discharge Summary  Carolyn Russo WSF:[id number] DOB: 01-30-1951 DOA: 05/06/2021  PCP: [name], MD  Admit date: 05/06/2021 Discharge date: 05/07/2021  Admitted From: CIR Disposition: CIR  Recommendations for Outpatient Follow-up:  Follow ups as below. Please obtain CBC/BMP/Mag at follow up Please follow up on the following pending results: None   Discharge Condition: Stable CODE STATUS: Full code  Hospital Course: 70 y.o. female with PMH of ESRD on HD MWF, lower GIB, DM-2 HTN, HLD, diastolic CHF and recent hospitalization from 6/24-6/28 for acute CVA with left-sided weakness and ABLA due to lower GIB returning from [place] earlier this morning with "left-sided weakness and altered mental status.  The plan was to start aspirin in about a week in the setting of GI bleed.  Patient admitted out of concern for CVA.  However, MRI brain without acute finding.  EEG with evidence of potential epileptogenicity arising from right central parietal region, cortical dysfunction arising from right hemisphere likely secondary to underlying stroke and mild diffuse encephalopathy but no seizure.  Neurology started Keppra 500 mg nightly.  Neurology also recommended continuing Plavix.  She is already on high intensity statin. Therapy recommended return to CIR  See individual problem list below for more on hospital course.  Discharge Diagnoses:  Strokelike symptoms-acute CVA ruled out by MRI brain which shows evolving prior infarction of the right corona radiata and BG.  CTA head and neck without acute finding.  EEG with evidence of potential epileptogenicity arising from right central parietal region, cortical dysfunction arising from right hemisphere likely secondary to underlying stroke and mild diffuse encephalopathy but no seizure.  Neurology started Keppra 500 mg nightly. -Continue Keppra 500 mg nightly -Discontinue tramadol as this could increase the risk of further seizure -Continue  Crestor and Plavix.  -Monitor for GI bleed given recent history of GI bleed. -Continue therapy   Controlled IDDM-2 with hyperglycemia and hyperlipidemia: Recent A1c 5.2% on 6/24.  Does not seem to be on medication per chart review.  Normocytic anemia/anemia of renal disease: H&H stable after Plavix and aspirin. History of lower GI bleed-normal EGD on 6/26.  Flex sigmoidoscopy at the same time showed hemorrhoids and large amount of stool obscuring visualization. Recent Labs    04/22/21 0502 04/23/21 0436 04/27/21 1017 04/29/21 1325 04/30/21 0508 05/01/21 0553 05/03/21 1044 05/05/21 1131 05/06/21 0456 05/07/21 0729  HGB 9.4* 10.0* 9.6* 10.0* 9.9* 9.8* 9.7* 11.1* 10.6* 10.6*  -Recheck CBC in about 1 week or sooner if needed -Monitor for signs of GI bleed -Continue p.o. Protonix    ESRD on HD MWF Bone mineral disorder -HD per nephrology   Chronic diastolic CHF/history of CAD: Appears euvolemic on exam.  No cardiopulmonary symptoms. -Fluid management by HD   Essential hypertension: BP within acceptable range. -Resume home Coreg   Class I obesity Body mass index is 32.26 kg/m.       Family communication: Attempted to call patient's husband for update but no answer.  Did not leave voicemail.     Discharge Exam: Vitals:   05/07/21 1030 05/07/21 1100  BP: (!) 96/50 (!) 135/59  Pulse: 65 68  Resp: 15   Temp:    SpO2:      GENERAL: No apparent distress.  Nontoxic. HEENT: MMM.  Vision and hearing grossly intact.  NECK: Supple.  No apparent JVD.  RESP: On RA.  No IWOB.  Fair aeration bilaterally. CVS:  RRR. Heart sounds normal.  Right aVF with good bruits. ABD/GI/GU: Bowel sounds present. Soft.  Non tender.  MSK/EXT:  Moves extremities. No apparent deformity. No edema.  SKIN: no apparent skin lesion or wound NEURO: Awake and alert.  Oriented x4 except date.  Mild left facial droop.  Tends to neglect her left side.  Motor 4/5 in both upper and lower extremities.   Sensory and reflexes grossly intact. Finger to nose intact. PSYCH: Calm. Normal affect.   Discharge Instructions  Discharge Instructions     Diet - low sodium heart healthy   Complete by: As directed    Diet Carb Modified   Complete by: As directed    Increase activity slowly   Complete by: As directed    No wound care   Complete by: As directed       Allergies as of 05/07/2021       Reactions   Food Anaphylaxis   Peanuts; Almonds   Other Shortness Of Breath   Peanuts; Almonds   Wheat Bran Shortness Of Breath   Statins Itching, Other (See Comments)   Generalized aches   Iodine Other (See Comments)   The patient denied this allergy to me on 03/16/2021   Shellfish Allergy Other (See Comments)   Mouth gets raw   Sitagliptin    Other reaction(s): Unknown   Tetracycline Other (See Comments)   Raw mouth   Tetracyclines & Related Itching   Contrast Media [iodinated Diagnostic Agents] Rash   Happened during CT scan over 30 years ago        Medication List     STOP taking these medications    furosemide 40 MG tablet Commonly known as: LASIX   magnesium oxide 400 (241.3 Mg) MG tablet Commonly known as: MAG-OX   traMADol 50 MG tablet Commonly known as: ULTRAM       TAKE these medications    acetaminophen 500 MG tablet Commonly known as: TYLENOL Take 1 tablet (500 mg total) by mouth every 6 (six) hours as needed for mild pain, fever or headache.   albuterol 108 (90 Base) MCG/ACT inhaler Commonly known as: VENTOLIN HFA Inhale 2 puffs into the lungs every 4 (four) hours as needed for wheezing or shortness of breath.   albuterol (2.5 MG/3ML) 0.083% nebulizer solution Commonly known as: PROVENTIL Take 3 mLs (2.5 mg total) by nebulization every 4 (four) hours as needed for wheezing or shortness of breath.   b complex vitamins capsule Take 1 capsule by mouth daily.   Besivance 0.6 % Susp Generic drug: Besifloxacin HCl Place 1 drop into both eyes See admin  instructions. After  eye injections   carvedilol 3.125 MG tablet Commonly known as: COREG Take 1 tablet (3.125 mg total) by mouth 2 (two) times daily with a meal.   clopidogrel 75 MG tablet Commonly known as: PLAVIX Take 1 tablet (75 mg total) by mouth daily.   Dexcom G6 Receiver Devi Use as directed to check blood sugars 3 times per day dx: e11.65   Dexcom G6 Sensor Misc Use as directed to check blood sugars 3 times per day dx: e11.65   Dexcom G6 Transmitter Misc Use as directed to check blood sugars 3 times per day dx: e11.65   FDGARD PO Take 1 tablet by mouth 2 (two) times daily as needed (nausea).   fluticasone 50 MCG/ACT nasal spray Commonly known as: FLONASE Place 2 sprays into the nose daily as needed for allergies.   hydrocortisone 2.5 % rectal cream Commonly known as: Anusol-HC Place 1 application rectally 2 (two) times daily as needed for  hemorrhoids or anal itching.   levETIRAcetam 500 MG tablet Commonly known as: KEPPRA Take 1 tablet (500 mg total) by mouth at bedtime.   ondansetron 4 MG tablet Commonly known as: Zofran Take 1 tablet (4 mg total) by mouth every 6 (six) hours as needed for nausea or vomiting.   OneTouch Delica Lancets 10U Misc Use as directed to check blood sugars before breakfast and dinner dx: e11.65   pantoprazole 40 MG tablet Commonly known as: Protonix Take 1 tablet (40 mg total) by mouth daily.   rosuvastatin 20 MG tablet Commonly known as: CRESTOR Take 1 tablet (20 mg total) by mouth daily.   senna-docusate 8.6-50 MG tablet Commonly known as: Senokot-S Take 1 tablet by mouth at bedtime as needed for mild constipation.   sevelamer carbonate 800 MG tablet Commonly known as: RENVELA Take 1 tablet (800 mg total) by mouth 3 (three) times daily with meals.   Symbicort 160-4.5 MCG/ACT inhaler Generic drug: budesonide-formoterol Inhale 2 puffs into the lungs 2 (two) times daily.   vitamin C 500 MG tablet Commonly known as:  ASCORBIC ACID Take 500 mg by mouth daily.   VITAMIN D3 PO Take 2,000 Units by mouth daily.        Consultations: Neurology Nephrology  Procedures/Studies:  CT ANGIO HEAD W OR WO CONTRAST  Result Date: 05/05/2021 CLINICAL DATA:  Initial evaluation for acute stroke, TIA. EXAM: CT ANGIOGRAPHY HEAD AND NECK CT PERFUSION BRAIN TECHNIQUE: Multidetector CT imaging of the head and neck was performed using the standard protocol during bolus administration of intravenous contrast. Multiplanar CT image reconstructions and MIPs were obtained to evaluate the vascular anatomy. Carotid stenosis measurements (when applicable) are obtained utilizing NASCET criteria, using the distal internal carotid diameter as the denominator. Multiphase CT imaging of the brain was performed following IV bolus contrast injection. Subsequent parametric perfusion maps were calculated using RAPID software. CONTRAST:  156mL OMNIPAQUE IOHEXOL 350 MG/ML SOLN COMPARISON:  Prior head CT from earlier the same day as well as previous MRA from 04/17/2021. FINDINGS: CTA NECK FINDINGS Aortic arch: Visualized aortic arch normal in caliber with normal 3 vessel morphology. No hemodynamically significant stenosis seen about the origin of the great vessels. Right carotid system: Right common and internal carotid arteries patent without stenosis, dissection or occlusion. Minimal atheromatous plaque about the proximal cervical right ICA without stenosis. Left carotid system: Left common and internal carotid arteries patent without stenosis, dissection or occlusion. Vertebral arteries: Both vertebral arteries arise from subclavian arteries. No proximal subclavian artery stenosis. Vertebral arteries diminutive but widely patent within the neck without stenosis, dissection or occlusion. Skeleton: No visible acute osseous finding. No discrete or worrisome osseous lesions. Other neck: No other acute soft tissue abnormality within the neck. 7 mm right  thyroid nodule noted, of doubtful significance given size and patient age, no follow-up imaging recommended (ref: J Am Coll Radiol. 2015 Feb;12(2): 143-50). Right-sided hemodialysis catheter in place. Upper chest: Visualized upper chest demonstrates no acute finding. Review of the MIP images confirms the above findings CTA HEAD FINDINGS Anterior circulation: Petrous segments patent bilaterally. Mild atheromatous change within the carotid siphons without hemodynamically significant stenosis. No made of a subtle 2 mm focal outpouching arising from the proximal cavernous right ICA, which could reflect a small aneurysm versus vascular infundibulum (series 7, image 254). A1 segments patent bilaterally. Normal anterior communicating artery complex. Anterior cerebral arteries patent to their distal aspects without stenosis. No M1 stenosis or occlusion. Normal MCA bifurcations. Distal MCA branches well perfused  and symmetric. Posterior circulation: Both V4 segments patent to the vertebrobasilar junction without stenosis. Both PICA origins patent and normal. Basilar patent to its distal aspect without stenosis. Superior cerebellar arteries patent bilaterally. Both PCA supplied via the basilar as well as prominent bilateral posterior communicating arteries. Atheromatous irregularity within the bilateral P2 segments with associated mild to moderate multifocal narrowing. PCAs remain patent to their distal aspects. Venous sinuses: Grossly patent allowing for timing the contrast bolus. Anatomic variants: None significant. Review of the MIP images confirms the above findings CT Brain Perfusion Findings: ASPECTS: Not calculated. The CT perfusion portion of this exam unfortunately failed due to technical difficulty. No perfusion maps were generated. IMPRESSION: 1. Negative CTA for emergent large vessel occlusion. 2. Mild atheromatous change about the major arterial vasculature of the head and neck as above. No proximal high-grade or  correctable stenosis. 3. 2 mm focal outpouching arising from the proximal cavernous right ICA, which could reflect a small aneurysm versus vascular infundibulum. Attention at follow-up recommended. 4. CT perfusion portion of this exam unfortunately failed due to technical difficulty. No perfusion maps were generated. Critical Value/emergent results were called by telephone at the time of interpretation on 05/05/2021 at 8:15 pm to provider ERIC [facility], who verbally acknowledged these results. Electronically Signed   By: Jeannine Boga M.D.   On: 05/05/2021 21:04   DG Chest 2 View  Result Date: 05/03/2021 CLINICAL DATA:  70 year old female with shortness of breath. EXAM: CHEST - 2 VIEW COMPARISON:  Chest radiograph dated 04/29/2021. FINDINGS: Dialysis catheter with tip in similar position close to the cavoatrial junction. No focal consolidation, pleural effusion or pneumothorax. Left lung base atelectasis. Stable cardiac silhouette. No acute osseous pathology. Degenerative changes of the spine. IMPRESSION: No active cardiopulmonary disease. Electronically Signed   By: Anner Crete M.D.   On: 05/03/2021 21:23   DG Chest 2 View  Result Date: 04/29/2021 CLINICAL DATA:  Pt experiencing SOB  CVA EXAM: CHEST - 2 VIEW COMPARISON:  04/22/2021 FINDINGS: Relatively low lung volumes. Chronic linear scarring or subsegmental atelectasis in the left infrahilar region and laterally in the left mid lung. Heart size upper limits normal. Tunneled right IJ hemodialysis catheter extends to the mid right atrium as before. No effusion.  No pneumothorax. Thoracic dextroscoliosis. IMPRESSION: Low lung volumes.  No acute findings. Electronically Signed   By: Lucrezia Europe M.D.   On: 04/29/2021 11:42   DG Chest 2 View  Result Date: 04/22/2021 CLINICAL DATA:  Leukocytosis. EXAM: CHEST - 2 VIEW COMPARISON:  April 16, 2021. FINDINGS: Stable cardiomediastinal silhouette. Right internal jugular dialysis catheter is unchanged. No  pneumothorax is noted. Minimal left basilar subsegmental atelectasis is noted. Right lung is clear. Bony thorax is unremarkable. IMPRESSION: Minimal left basilar subsegmental atelectasis. Electronically Signed   By: Marijo Conception M.D.   On: 04/22/2021 14:04   DG Abd 1 View  Result Date: 04/26/2021 CLINICAL DATA:  LEFT-sided abdominal pain since yesterday. Constipation. EXAM: ABDOMEN - 1 VIEW COMPARISON:  CT of the abdomen and pelvis on 12/13/2004 FINDINGS: Bowel gas pattern is nonobstructive. Moderate stool burden noted within nondilated loops of colon. Scoliosis is associated with degenerative changes in the lumbar spine. IMPRESSION: No evidence for acute  abnormality.  Moderate stool burden. Electronically Signed   By: Nolon Nations M.D.   On: 04/26/2021 12:44   CT HEAD WO CONTRAST  Result Date: 05/05/2021 CLINICAL DATA:  70 year old female with neurologic deficit. EXAM: CT HEAD WITHOUT CONTRAST TECHNIQUE: Contiguous axial images were  obtained from the base of the skull through the vertex without intravenous contrast. COMPARISON:  Head CT dated 05/03/2021. FINDINGS: Brain: Mild age-related atrophy and moderate chronic microvascular ischemic changes. An area of infarct again noted lateral to the right thalamus, seen on the prior CT. There is no acute intracranial hemorrhage. No mass effect or midline shift. No extra-axial fluid collection. Vascular: No hyperdense vessel or unexpected calcification. Skull: Normal. Negative for fracture or focal lesion. Sinuses/Orbits: The visualized paranasal sinuses and mastoid air cells are clear. Bilateral maxillary sinus surgeries. Other: None IMPRESSION: 1. No acute intracranial pathology. 2. Age-related atrophy and chronic microvascular ischemic changes. Similar appearance of focal area of old infarct lateral to the right thalamus. Electronically Signed   By: Anner Crete M.D.   On: 05/05/2021 19:56   CT HEAD WO CONTRAST  Result Date: 05/03/2021 CLINICAL  DATA:  Mental status changes of unknown cause. EXAM: CT HEAD WITHOUT CONTRAST TECHNIQUE: Contiguous axial images were obtained from the base of the skull through the vertex without intravenous contrast. COMPARISON:  04/18/2021 FINDINGS: Brain: No focal abnormality seen affecting the brainstem or cerebellum. Subacute infarction in the right lateral thalamus/posterior limb internal capsule is more clearly visible. No evidence of extension. Chronic small-vessel ischemic changes elsewhere throughout the cerebral hemispheric white matter as seen previously. No sign of new insult, mass, hemorrhage, hydrocephalus or extra-axial collection. Vascular: There is atherosclerotic calcification of the major vessels at the base of the brain. Skull: Negative Sinuses/Orbits: Clear sinuses. Previous functional endoscopic sinus surgery. Orbits negative. Other: None IMPRESSION: No acute CT finding. Subacute infarction in the right lateral thalamus/radiating white matter tracts is more clearly visible but there is no sign of extension or hemorrhage. Chronic small-vessel ischemic changes elsewhere affecting the cerebral hemispheric white matter. Electronically Signed   By: Nelson Chimes M.D.   On: 05/03/2021 19:06   CT HEAD WO CONTRAST  Result Date: 04/18/2021 CLINICAL DATA:  Follow-up stroke. EXAM: CT HEAD WITHOUT CONTRAST TECHNIQUE: Contiguous axial images were obtained from the base of the skull through the vertex without intravenous contrast. COMPARISON:  CT scan April 14, 2021.  MRI of the head April 16, 2021. FINDINGS: Brain: No subdural, epidural, or subarachnoid hemorrhage. No mass effect or midline shift. Ventricles and sulci are unchanged. The known right basal ganglia infarct is more pronounced on CT imaging in the interval seen on series 4, image 17 with decreased attenuation. No associated hemorrhage. White matter changes otherwise stable. Cerebellum, brainstem, and basal cisterns are normal. No other evidence of acute  infarct or ischemia. Vascular: Calcified atherosclerosis in the intracranial carotids. Skull: Normal. Negative for fracture or focal lesion. Sinuses/Orbits: No acute finding. Other: No other abnormalities. IMPRESSION: 1. The right basal ganglia infarct is better visualized on today's CT compared to the April 14, 2021 CT due to group decreasing attenuation. No associated hemorrhage identified. No other significant interval changes. Electronically Signed   By: Dorise Bullion III M.D   On: 04/18/2021 13:04   CT HEAD WO CONTRAST  Result Date: 04/14/2021 CLINICAL DATA:  Possible stroke.  LEFT-sided weakness EXAM: CT HEAD WITHOUT CONTRAST TECHNIQUE: Contiguous axial images were obtained from the base of the skull through the vertex without intravenous contrast. COMPARISON:  None. FINDINGS: Brain: No acute intracranial hemorrhage. No focal mass lesion. No CT evidence of acute infarction. No midline shift or mass effect. No hydrocephalus. Basilar cisterns are patent. There are periventricular and subcortical white matter hypodensities. Generalized cortical atrophy. Vascular: No hyperdense vessel or unexpected calcification. Skull:  Normal. Negative for fracture or focal lesion. Sinuses/Orbits: Paranasal sinuses and mastoid air cells are clear. Orbits are clear. Other: None. IMPRESSION: 1. No acute intracranial findings. 2. Atrophy and white matter microvascular disease. Electronically Signed   By: Suzy Bouchard M.D.   On: 04/14/2021 18:58   CT ANGIO NECK W OR WO CONTRAST  Result Date: 05/05/2021 CLINICAL DATA:  Initial evaluation for acute stroke, TIA. EXAM: CT ANGIOGRAPHY HEAD AND NECK CT PERFUSION BRAIN TECHNIQUE: Multidetector CT imaging of the head and neck was performed using the standard protocol during bolus administration of intravenous contrast. Multiplanar CT image reconstructions and MIPs were obtained to evaluate the vascular anatomy. Carotid stenosis measurements (when applicable) are obtained  utilizing NASCET criteria, using the distal internal carotid diameter as the denominator. Multiphase CT imaging of the brain was performed following IV bolus contrast injection. Subsequent parametric perfusion maps were calculated using RAPID software. CONTRAST:  121mL OMNIPAQUE IOHEXOL 350 MG/ML SOLN COMPARISON:  Prior head CT from earlier the same day as well as previous MRA from 04/17/2021. FINDINGS: CTA NECK FINDINGS Aortic arch: Visualized aortic arch normal in caliber with normal 3 vessel morphology. No hemodynamically significant stenosis seen about the origin of the great vessels. Right carotid system: Right common and internal carotid arteries patent without stenosis, dissection or occlusion. Minimal atheromatous plaque about the proximal cervical right ICA without stenosis. Left carotid system: Left common and internal carotid arteries patent without stenosis, dissection or occlusion. Vertebral arteries: Both vertebral arteries arise from subclavian arteries. No proximal subclavian artery stenosis. Vertebral arteries diminutive but widely patent within the neck without stenosis, dissection or occlusion. Skeleton: No visible acute osseous finding. No discrete or worrisome osseous lesions. Other neck: No other acute soft tissue abnormality within the neck. 7 mm right thyroid nodule noted, of doubtful significance given size and patient age, no follow-up imaging recommended (ref: J Am Coll Radiol. 2015 Feb;12(2): 143-50). Right-sided hemodialysis catheter in place. Upper chest: Visualized upper chest demonstrates no acute finding. Review of the MIP images confirms the above findings CTA HEAD FINDINGS Anterior circulation: Petrous segments patent bilaterally. Mild atheromatous change within the carotid siphons without hemodynamically significant stenosis. No made of a subtle 2 mm focal outpouching arising from the proximal cavernous right ICA, which could reflect a small aneurysm versus vascular infundibulum  (series 7, image 254). A1 segments patent bilaterally. Normal anterior communicating artery complex. Anterior cerebral arteries patent to their distal aspects without stenosis. No M1 stenosis or occlusion. Normal MCA bifurcations. Distal MCA branches well perfused and symmetric. Posterior circulation: Both V4 segments patent to the vertebrobasilar junction without stenosis. Both PICA origins patent and normal. Basilar patent to its distal aspect without stenosis. Superior cerebellar arteries patent bilaterally. Both PCA supplied via the basilar as well as prominent bilateral posterior communicating arteries. Atheromatous irregularity within the bilateral P2 segments with associated mild to moderate multifocal narrowing. PCAs remain patent to their distal aspects. Venous sinuses: Grossly patent allowing for timing the contrast bolus. Anatomic variants: None significant. Review of the MIP images confirms the above findings CT Brain Perfusion Findings: ASPECTS: Not calculated. The CT perfusion portion of this exam unfortunately failed due to technical difficulty. No perfusion maps were generated. IMPRESSION: 1. Negative CTA for emergent large vessel occlusion. 2. Mild atheromatous change about the major arterial vasculature of the head and neck as above. No proximal high-grade or correctable stenosis. 3. 2 mm focal outpouching arising from the proximal cavernous right ICA, which could reflect a small aneurysm versus vascular infundibulum.  Attention at follow-up recommended. 4. CT perfusion portion of this exam unfortunately failed due to technical difficulty. No perfusion maps were generated. Critical Value/emergent results were called by telephone at the time of interpretation on 05/05/2021 at 8:15 pm to provider [facility], who verbally acknowledged these results. Electronically Signed   By: Jeannine Boga M.D.   On: 05/05/2021 21:04   MR ANGIO HEAD WO CONTRAST  Result Date: 04/17/2021 CLINICAL DATA:   Initial evaluation for acute stroke. EXAM: MRA NECK WITHOUT CONTRAST MRA HEAD WITHOUT CONTRAST TECHNIQUE: Multiplanar and multiecho pulse sequences of the neck were obtained without intravenous contrast. Angiographic images of the neck were obtained using MRA technique without and with intravenous contrast; Angiographic images of the Circle of Willis were obtained using MRA technique without intravenous contrast. COMPARISON:  Prior MRI from 04/16/2021. FINDINGS: MRA NECK FINDINGS AORTIC ARCH: Examination severely degraded by motion artifact. Partially visualized aortic arch grossly normal in caliber with normal branch pattern. No appreciable stenosis about the origin of the great vessels. RIGHT CAROTID SYSTEM: Visualized right CCA patent without stenosis. No appreciable stenosis or significant irregularity about the right carotid bifurcation. Visualized right ICA patent without stenosis or occlusion. LEFT CAROTID SYSTEM: Visualized left CCA patent without stenosis. No significant atheromatous narrowing or irregularity about the left bifurcation. Left ICA patent without stenosis or occlusion. VERTEBRAL ARTERIES: Origins of the vertebral arteries not well assessed on this exam. Visualized portions of the vertebral arteries patent with antegrade flow, with no appreciable stenosis or occlusion. MRA HEAD FINDINGS ANTERIOR CIRCULATION: Examination moderately degraded by motion artifact. Visualized distal cervical segments of the internal carotid arteries are patent with antegrade flow. Petrous, cavernous, and supraclinoid segments patent without stenosis or other abnormality. A1 segments patent. Normal anterior communicating artery complex. Anterior cerebral arteries grossly patent to their distal aspects without appreciable stenosis. M1 segments patent bilaterally. Ill-defined attenuation involving the distal left M1 segment and proximal M2 branches favored to be artifactual on this exam. No definite proximal stenosis.  MCA branches well perfused and symmetric. POSTERIOR CIRCULATION: Vertebral arteries codominant and patent to the vertebrobasilar junction without stenosis. Both PICA origins patent and normal. Basilar patent to its distal aspect without stenosis. Superior cerebellar arteries patent bilaterally. Both PCA supplied via the basilar as well as prominent bilateral posterior communicating arteries. PCAs patent to their distal aspects without definite proximal stenosis. No visible aneurysm or other vascular abnormality. IMPRESSION: 1. Technically limited exam due to extensive motion artifact. 2. Grossly negative MRA of the head and neck. No large vessel occlusion. No proximal high-grade or correctable stenosis. Electronically Signed   By: Jeannine Boga M.D.   On: 04/17/2021 03:36   MR ANGIO NECK WO CONTRAST  Result Date: 04/17/2021 CLINICAL DATA:  Initial evaluation for acute stroke. EXAM: MRA NECK WITHOUT CONTRAST MRA HEAD WITHOUT CONTRAST TECHNIQUE: Multiplanar and multiecho pulse sequences of the neck were obtained without intravenous contrast. Angiographic images of the neck were obtained using MRA technique without and with intravenous contrast; Angiographic images of the Circle of Willis were obtained using MRA technique without intravenous contrast. COMPARISON:  Prior MRI from 04/16/2021. FINDINGS: MRA NECK FINDINGS AORTIC ARCH: Examination severely degraded by motion artifact. Partially visualized aortic arch grossly normal in caliber with normal branch pattern. No appreciable stenosis about the origin of the great vessels. RIGHT CAROTID SYSTEM: Visualized right CCA patent without stenosis. No appreciable stenosis or significant irregularity about the right carotid bifurcation. Visualized right ICA patent without stenosis or occlusion. LEFT CAROTID SYSTEM: Visualized left CCA  patent without stenosis. No significant atheromatous narrowing or irregularity about the left bifurcation. Left ICA patent without  stenosis or occlusion. VERTEBRAL ARTERIES: Origins of the vertebral arteries not well assessed on this exam. Visualized portions of the vertebral arteries patent with antegrade flow, with no appreciable stenosis or occlusion. MRA HEAD FINDINGS ANTERIOR CIRCULATION: Examination moderately degraded by motion artifact. Visualized distal cervical segments of the internal carotid arteries are patent with antegrade flow. Petrous, cavernous, and supraclinoid segments patent without stenosis or other abnormality. A1 segments patent. Normal anterior communicating artery complex. Anterior cerebral arteries grossly patent to their distal aspects without appreciable stenosis. M1 segments patent bilaterally. Ill-defined attenuation involving the distal left M1 segment and proximal M2 branches favored to be artifactual on this exam. No definite proximal stenosis. MCA branches well perfused and symmetric. POSTERIOR CIRCULATION: Vertebral arteries codominant and patent to the vertebrobasilar junction without stenosis. Both PICA origins patent and normal. Basilar patent to its distal aspect without stenosis. Superior cerebellar arteries patent bilaterally. Both PCA supplied via the basilar as well as prominent bilateral posterior communicating arteries. PCAs patent to their distal aspects without definite proximal stenosis. No visible aneurysm or other vascular abnormality. IMPRESSION: 1. Technically limited exam due to extensive motion artifact. 2. Grossly negative MRA of the head and neck. No large vessel occlusion. No proximal high-grade or correctable stenosis. Electronically Signed   By: Jeannine Boga M.D.   On: 04/17/2021 03:36   MR BRAIN WO CONTRAST  Result Date: 05/06/2021 CLINICAL DATA:  Left-sided weakness EXAM: MRI HEAD WITHOUT CONTRAST TECHNIQUE: Multiplanar, multiecho pulse sequences of the brain and surrounding structures were obtained without intravenous contrast. COMPARISON:  04/16/2021 FINDINGS:  Significant motion artifact is present. Brain: There is persistent diffusion hyperintensity with decreased ADC hypointensity involving the right corona radiata and basal ganglia. No new abnormal diffusion. There is no acute infarction or intracranial hemorrhage. There is no intracranial mass, mass effect, or edema. There is no hydrocephalus or extra-axial fluid collection. Prominence of the ventricles and sulci reflects stable parenchymal volume loss. Patchy and confluent areas of T2 hyperintensity in the supratentorial and pontine white matter are nonspecific but probably reflects stable chronic microvascular ischemic changes. Very small chronic right cerebellar infarct is noted. Vascular: Major vessel flow voids at the skull base are preserved. Skull and upper cervical spine: Normal marrow signal is grossly preserved. Sinuses/Orbits: Paranasal sinuses are aerated. Bilateral lens replacements. Other: Sella is unremarkable.  Mastoid air cells are clear. IMPRESSION: No acute infarction. Evolving prior infarction of the right corona radiata and basal ganglia. Chronic microvascular ischemic changes. Electronically Signed   By: Macy Mis M.D.   On: 05/06/2021 10:40   MR BRAIN WO CONTRAST  Result Date: 04/16/2021 CLINICAL DATA:  Initial evaluation for acute stroke. EXAM: MRI HEAD WITHOUT CONTRAST TECHNIQUE: Multiplanar, multiecho pulse sequences of the brain and surrounding structures were obtained without intravenous contrast. COMPARISON:  Prior head CT from 04/14/2021. FINDINGS: Brain: Cerebral volume within normal limits for age. Patchy and hazy T2/FLAIR hyperintensity within the periventricular deep white matter both cerebral hemispheres most consistent with chronic small vessel ischemic disease, moderately advanced in nature. 2.1 cm focus of restricted diffusion seen involving the right basal ganglia, consistent with an acute ischemic infarct. No associated hemorrhage or mass effect. No other evidence for  acute or subacute ischemia. Gray-white matter differentiation otherwise maintained. No other areas of acute or subacute infarction. No encephalomalacia to suggest chronic cortical infarction. No foci of susceptibility artifact to suggest acute or chronic intracranial hemorrhage.  No mass lesion, midline shift or mass effect. No hydrocephalus or extra-axial fluid collection. Pituitary gland within normal limits. Suprasellar region normal. Midline structures intact. Vascular: Major intracranial vascular flow voids are maintained. Skull and upper cervical spine: Craniocervical junction within normal limits. Bone marrow signal intensity normal. No scalp soft tissue abnormality. Sinuses/Orbits: Patient status post bilateral ocular lens replacement. Globes and orbital soft tissues demonstrate no acute finding. Mild scattered mucosal thickening noted within the ethmoidal air cells. Paranasal sinuses are otherwise clear. No mastoid effusion. Inner ear structures grossly normal. Other: None. IMPRESSION: 1. 2.1 cm acute ischemic nonhemorrhagic right basal ganglia infarct. 2. Underlying moderate chronic microvascular ischemic disease. Electronically Signed   By: Jeannine Boga M.D.   On: 04/16/2021 21:16   CT CEREBRAL PERFUSION W CONTRAST  Result Date: 05/05/2021 CLINICAL DATA:  Initial evaluation for acute stroke, TIA. EXAM: CT ANGIOGRAPHY HEAD AND NECK CT PERFUSION BRAIN TECHNIQUE: Multidetector CT imaging of the head and neck was performed using the standard protocol during bolus administration of intravenous contrast. Multiplanar CT image reconstructions and MIPs were obtained to evaluate the vascular anatomy. Carotid stenosis measurements (when applicable) are obtained utilizing NASCET criteria, using the distal internal carotid diameter as the denominator. Multiphase CT imaging of the brain was performed following IV bolus contrast injection. Subsequent parametric perfusion maps were calculated using RAPID  software. CONTRAST:  140mL OMNIPAQUE IOHEXOL 350 MG/ML SOLN COMPARISON:  Prior head CT from earlier the same day as well as previous MRA from 04/17/2021. FINDINGS: CTA NECK FINDINGS Aortic arch: Visualized aortic arch normal in caliber with normal 3 vessel morphology. No hemodynamically significant stenosis seen about the origin of the great vessels. Right carotid system: Right common and internal carotid arteries patent without stenosis, dissection or occlusion. Minimal atheromatous plaque about the proximal cervical right ICA without stenosis. Left carotid system: Left common and internal carotid arteries patent without stenosis, dissection or occlusion. Vertebral arteries: Both vertebral arteries arise from subclavian arteries. No proximal subclavian artery stenosis. Vertebral arteries diminutive but widely patent within the neck without stenosis, dissection or occlusion. Skeleton: No visible acute osseous finding. No discrete or worrisome osseous lesions. Other neck: No other acute soft tissue abnormality within the neck. 7 mm right thyroid nodule noted, of doubtful significance given size and patient age, no follow-up imaging recommended (ref: J Am Coll Radiol. 2015 Feb;12(2): 143-50). Right-sided hemodialysis catheter in place. Upper chest: Visualized upper chest demonstrates no acute finding. Review of the MIP images confirms the above findings CTA HEAD FINDINGS Anterior circulation: Petrous segments patent bilaterally. Mild atheromatous change within the carotid siphons without hemodynamically significant stenosis. No made of a subtle 2 mm focal outpouching arising from the proximal cavernous right ICA, which could reflect a small aneurysm versus vascular infundibulum (series 7, image 254). A1 segments patent bilaterally. Normal anterior communicating artery complex. Anterior cerebral arteries patent to their distal aspects without stenosis. No M1 stenosis or occlusion. Normal MCA bifurcations. Distal MCA  branches well perfused and symmetric. Posterior circulation: Both V4 segments patent to the vertebrobasilar junction without stenosis. Both PICA origins patent and normal. Basilar patent to its distal aspect without stenosis. Superior cerebellar arteries patent bilaterally. Both PCA supplied via the basilar as well as prominent bilateral posterior communicating arteries. Atheromatous irregularity within the bilateral P2 segments with associated mild to moderate multifocal narrowing. PCAs remain patent to their distal aspects. Venous sinuses: Grossly patent allowing for timing the contrast bolus. Anatomic variants: None significant. Review of the MIP images confirms the above findings  CT Brain Perfusion Findings: ASPECTS: Not calculated. The CT perfusion portion of this exam unfortunately failed due to technical difficulty. No perfusion maps were generated. IMPRESSION: 1. Negative CTA for emergent large vessel occlusion. 2. Mild atheromatous change about the major arterial vasculature of the head and neck as above. No proximal high-grade or correctable stenosis. 3. 2 mm focal outpouching arising from the proximal cavernous right ICA, which could reflect a small aneurysm versus vascular infundibulum. Attention at follow-up recommended. 4. CT perfusion portion of this exam unfortunately failed due to technical difficulty. No perfusion maps were generated. Critical Value/emergent results were called by telephone at the time of interpretation on 05/05/2021 at 8:15 pm to provider [facility], who verbally acknowledged these results. Electronically Signed   By: Jeannine Boga M.D.   On: 05/05/2021 21:04   DG Chest Port 1 View  Result Date: 05/05/2021 CLINICAL DATA:  Shortness of breath EXAM: PORTABLE CHEST 1 VIEW COMPARISON:  05/03/2021 FINDINGS: Cardiac shadow is mildly enlarged but stable. Right jugular central line is again seen deep in the right atrium but stable. Lungs are well aerated bilaterally with  patchy stable atelectasis/scarring on the left. No acute abnormality is seen. IMPRESSION: No acute abnormality noted. Stable changes in the left base are seen. Electronically Signed   By: Inez Catalina M.D.   On: 05/05/2021 19:08   DG CHEST PORT 1 VIEW  Result Date: 04/16/2021 CLINICAL DATA:  Stroke. EXAM: PORTABLE CHEST 1 VIEW COMPARISON:  Chest x-ray dated Mar 23, 2021. FINDINGS: Unchanged tunneled right internal jugular dialysis catheter. Stable cardiomegaly. Unchanged mild left basilar atelectasis. No focal consolidation, pleural effusion, or pneumothorax. No acute osseous abnormality. IMPRESSION: No active disease. Electronically Signed   By: Titus Dubin M.D.   On: 04/16/2021 20:32   EEG adult  Result Date: 05/06/2021 [name], MD     05/06/2021  4:53 PM Patient Name: Carolyn Russo MRN: [id number] Epilepsy Attending: [name] Referring Physician/Provider: Elmyra Ricks, NP Date: 05/06/2021 Duration: 27.06 mins Patient history: 70yo F with recent stroke presented with worsening of previous stroke symptoms during HD. EEG to evaluate for seizure. Level of alertness: Awake AEDs during EEG study: None Technical aspects: This EEG study was done with scalp electrodes positioned according to the 10-20 International system of electrode placement. Electrical activity was acquired at a sampling rate of 500Hz  and reviewed with a high frequency filter of 70Hz  and a low frequency filter of 1Hz . EEG data were recorded continuously and digitally stored. Description: The posterior dominant rhythm consists of 9 Hz activity of moderate voltage (25-35 uV) seen predominantly in posterior head regions, symmetric and reactive to eye opening and eye closing. EEG showed continuous sharply contoured 2-3Hz  delta slowing in right hemisphere as well as intermittent 3-6hz  theta-delta slowing. Spikes were also noted in right centro-parietal region. Hyperventilation and photic stimulation were not  performed.   ABNORMALITY -Spike, right centro-parietal region. - Continuous slow, right hemisphere - Intermittent slow, generalized IMPRESSION: This study showed evidence of potential epileptogenicity arising from right centro-parietal region. There is also cortical dysfunction arising from right hemisphere likely secondary to underlying stroke. Additionally, there is mild diffuse encephalopathy, nonspecific etiology. No seizures were seen throughout the recording. [name]       The results of significant diagnostics from this hospitalization (including imaging, microbiology, ancillary and laboratory) are listed below for reference.     Microbiology: No results found for this or any previous visit (from the past 240 hour(s)).  Labs:  CBC: Recent Labs  Lab 05/01/21 0553 05/03/21 1044 05/05/21 1131 05/06/21 0456 05/07/21 0729  WBC 13.0* 12.8* 10.1 8.8 12.1*  NEUTROABS 6.9 7.9*  --   --   --   HGB 9.8* 9.7* 11.1* 10.6* 10.6*  HCT 28.4* 29.5* 33.2* 31.0* 31.2*  MCV 86.1 88.6 87.4 88.6 87.4  PLT 238 299 391 235 370   BMP &GFR Recent Labs  Lab 05/01/21 0553 05/03/21 1044 05/05/21 1131 05/07/21 0729  NA 132* 131* 132* 135  K 4.2 4.8 4.2 4.2  CL 98 97* 94* 99  CO2 27 25 30 27   GLUCOSE 163* 306* 123* 156*  BUN 23 21 14 22   CREATININE 5.50* 5.90* 5.78* 6.99*  CALCIUM 8.8* 8.6* 9.5 8.8*  PHOS 4.7* 4.8* 5.1* 4.8*   Estimated Creatinine Clearance: 7.1 mL/min (A) (by C-G formula based on SCr of 6.99 mg/dL (H)). Liver & Pancreas: Recent Labs  Lab 05/01/21 0553 05/03/21 1044 05/05/21 1131 05/07/21 0729  ALBUMIN 2.9* 2.8* 3.3* 3.0*   No results for input(s): LIPASE, AMYLASE in the last 168 hours. No results for input(s): AMMONIA in the last 168 hours. Diabetic: No results for input(s): HGBA1C in the last 72 hours. Recent Labs  Lab 05/06/21 0654 05/06/21 1111 05/06/21 1637 05/06/21 2148 05/07/21 0641  GLUCAP 72 82 221* 291* 173*   Cardiac Enzymes: No  results for input(s): CKTOTAL, CKMB, CKMBINDEX, TROPONINI in the last 168 hours. No results for input(s): PROBNP in the last 8760 hours. Coagulation Profile: No results for input(s): INR, PROTIME in the last 168 hours. Thyroid Function Tests: No results for input(s): TSH, T4TOTAL, FREET4, T3FREE, THYROIDAB in the last 72 hours. Lipid Profile: No results for input(s): CHOL, HDL, LDLCALC, TRIG, CHOLHDL, LDLDIRECT in the last 72 hours. Anemia Panel: No results for input(s): VITAMINB12, FOLATE, FERRITIN, TIBC, IRON, RETICCTPCT in the last 72 hours. Urine analysis:    Component Value Date/Time   COLORURINE YELLOW 03/09/2021 2156   APPEARANCEUR HAZY (A) 03/09/2021 2156   LABSPEC 1.015 03/09/2021 2156   PHURINE 6.0 03/09/2021 2156   GLUCOSEU >=500 (A) 03/09/2021 2156   HGBUR SMALL (A) 03/09/2021 2156   BILIRUBINUR NEGATIVE 03/09/2021 2156   BILIRUBINUR negative 06/18/2019 1409   KETONESUR NEGATIVE 03/09/2021 2156   PROTEINUR >=300 (A) 03/09/2021 2156   UROBILINOGEN 0.2 06/18/2019 1409   NITRITE NEGATIVE 03/09/2021 2156   LEUKOCYTESUR NEGATIVE 03/09/2021 2156   Sepsis Labs: Invalid input(s): PROCALCITONIN, LACTICIDVEN   Time coordinating discharge: 40 minutes  SIGNED:  Mercy Riding, MD  [facility] 05/07/2021, 11:31 AM  If 7PM-7AM, please contact night-coverage [email or web address]

## 2021-05-07 NOTE — Progress Notes (Signed)
Patient ID: Carolyn Russo, female   DOB: 19-Sep-1951, 70 y.o.   MRN: [id number]  This SW familiar with patient as previous patient. Please refer to assessment completed on  04/23/2021. SW will f/u with pt husband to confirm d/c plan remains pt to d/c to home with 24/7 care from private aid service during the day. Pt is dialysis MWF at [facility]- first shift seat.   Loralee Pacas, MSW, [place] Office: [phone] Cell: [phone] Fax: [phone]

## 2021-05-08 DIAGNOSIS — I639 Cerebral infarction, unspecified: Secondary | ICD-10-CM | POA: Diagnosis not present

## 2021-05-08 LAB — GLUCOSE, CAPILLARY
Glucose-Capillary: 141 mg/dL — ABNORMAL HIGH (ref 70–99)
Glucose-Capillary: 203 mg/dL — ABNORMAL HIGH (ref 70–99)
Glucose-Capillary: 226 mg/dL — ABNORMAL HIGH (ref 70–99)
Glucose-Capillary: 274 mg/dL — ABNORMAL HIGH (ref 70–99)

## 2021-05-08 MED ORDER — CHLORHEXIDINE GLUCONATE CLOTH 2 % EX PADS
6.0000 | MEDICATED_PAD | Freq: Two times a day (BID) | CUTANEOUS | Status: DC
  Administered 2021-05-08 – 2021-05-27 (×33): 6 via TOPICAL

## 2021-05-08 MED ORDER — MELATONIN 3 MG PO TABS
3.0000 mg | ORAL_TABLET | Freq: Every day | ORAL | Status: DC
  Administered 2021-05-08 – 2021-05-26 (×19): 3 mg via ORAL
  Filled 2021-05-08 (×19): qty 1

## 2021-05-08 NOTE — Progress Notes (Signed)
Subjective: In room eating breakfast, no new complaints, right hand slightly numb status post AV fistula placed but not worsening since placement  Objective Vital signs in last 24 hours: Vitals:   05/07/21 1630 05/07/21 1955 05/08/21 0443 05/08/21 1121  BP: 125/86 (!) 140/59 (!) 150/63 (!) 171/75  Pulse: 78 74 75 70  Resp: 16 20 20 17   Temp: 98.3 F (36.8 C) 98.3 F (36.8 C) 98.6 F (37 C) 97.6 F (36.4 C)  TempSrc: Oral Oral Oral Oral  SpO2: 100% 100% 96% 100%  Weight: 80.2 kg     Height: 5\' 2"  (1.575 m)      Weight change:   Physical Exam General: Alert elderly female in NAD Heart:RRR, no mrg Lungs:CTAB, nml WOB Abdomen:soft, NTND Extremities:no LE edema Dialysis Access: TDC, RU AVF maturing +b/t    OP Dialysis Orders: [facility]-MWF 4 hrs 180NRe 400/Autoflow 1.5 86.5 kg 2.0K/2.0 Ca TDC -heparin 2000 units IV TIW -Mircera 100 mcg IV q 2 weeks (last dose 04/14/2021 -Hectorol 5 mcg IV TIW    Problem/Plan: Acute subcortical R CVA-per Primary/Neurology. Admitted back to hospital 7/14 due to AMS/stroke like symptoms.  Neurology reassessed and determined no acute stroke, but possibly watershed issue(I.e, hypotension related) Returned to [facility] 7/15 as she was stable Acute LGIB- Hx hemorrhoidal bleeding in past. new onset 06/25 post clopidogrel load. s/p 2 units PRBCS on 04/18/21.  Clopidogrel and ASA on hold. No heparin with HD. Per GI: EGD and flex sig with no etiology and bleeding scan (-).  Hgb 10.6 last on 7/15 ESRD -  MWF schedule as an outpatient.  Off heparin d/t new GIB. 1st stage R basilic vein transposition on 7/8 by Dr. Scot Dock.  Signing off early, encouraged compliance.  AMS following HD 7/14.  Now have will lower goal for HD, plan to keep even on Monday dialysis unless excess volume on exam or excessive blood pressure elevation  Hypertension/volume - BP's are good, no BP lowering meds at this time. Pt was down to 78kg , lowest weight here, after HD Wed. Suspect hypovolemia  contributing to her episode on Wed.  No fluid off 7/15 w/ HD, will keep + w/ NS boluses as needed, and let volume come up a few kg. Anemia CKD -Hgb 10.6 on 7/15 as noted in # 2. aranesp 134mcg weekly - changed to [place]. Transfuse PRN. Metabolic bone disease -  PTH only 110 03/26/21 on 5 mcg Hectorol, hectorol was held at that time.  Rechecked pth 128. No need to restart VDRA.  Changed renvela to powder d/t patient inability to swallow pills.  Nutrition - note dysphagia diet, tolerated diet right now currently DTM2- per primary   Ernest Haber, PA-C [place] [phone] 05/08/2021,12:29 PM  LOS: 1 day   Labs: Basic Metabolic Panel: Recent Labs  Lab 05/03/21 1044 05/05/21 1131 05/07/21 0729  NA 131* 132* 135  K 4.8 4.2 4.2  CL 97* 94* 99  CO2 25 30 27   GLUCOSE 306* 123* 156*  BUN 21 14 22   CREATININE 5.90* 5.78* 6.99*  CALCIUM 8.6* 9.5 8.8*  PHOS 4.8* 5.1* 4.8*   Liver Function Tests: Recent Labs  Lab 05/03/21 1044 05/05/21 1131 05/07/21 0729  ALBUMIN 2.8* 3.3* 3.0*   No results for input(s): LIPASE, AMYLASE in the last 168 hours. No results for input(s): AMMONIA in the last 168 hours. CBC: Recent Labs  Lab 05/03/21 1044 05/05/21 1131 05/06/21 0456 05/07/21 0729  WBC 12.8* 10.1 8.8 12.1*  NEUTROABS 7.9*  --   --   --  HGB 9.7* 11.1* 10.6* 10.6*  HCT 29.5* 33.2* 31.0* 31.2*  MCV 88.6 87.4 88.6 87.4  PLT 299 391 235 370   Cardiac Enzymes: No results for input(s): CKTOTAL, CKMB, CKMBINDEX, TROPONINI in the last 168 hours. CBG: Recent Labs  Lab 05/07/21 1542 05/07/21 1645 05/07/21 2200 05/08/21 0634 05/08/21 1123  GLUCAP 154* 157* 155* 141* 203*    Studies/Results: EEG adult  Result Date: 05/06/2021 [name], MD     05/06/2021  4:53 PM Patient Name: Carolyn Russo MRN: [id number] Epilepsy Attending: [name] Referring Physician/Provider: Elmyra Ricks, NP Date: 05/06/2021 Duration: 27.06 mins Patient  history: 70yo F with recent stroke presented with worsening of previous stroke symptoms during HD. EEG to evaluate for seizure. Level of alertness: Awake AEDs during EEG study: None Technical aspects: This EEG study was done with scalp electrodes positioned according to the 10-20 International system of electrode placement. Electrical activity was acquired at a sampling rate of 500Hz  and reviewed with a high frequency filter of 70Hz  and a low frequency filter of 1Hz . EEG data were recorded continuously and digitally stored. Description: The posterior dominant rhythm consists of 9 Hz activity of moderate voltage (25-35 uV) seen predominantly in posterior head regions, symmetric and reactive to eye opening and eye closing. EEG showed continuous sharply contoured 2-3Hz  delta slowing in right hemisphere as well as intermittent 3-6hz  theta-delta slowing. Spikes were also noted in right centro-parietal region. Hyperventilation and photic stimulation were not performed.   ABNORMALITY -Spike, right centro-parietal region. - Continuous slow, right hemisphere - Intermittent slow, generalized IMPRESSION: This study showed evidence of potential epileptogenicity arising from right centro-parietal region. There is also cortical dysfunction arising from right hemisphere likely secondary to underlying stroke. Additionally, there is mild diffuse encephalopathy, nonspecific etiology. No seizures were seen throughout the recording. Carolyn Russo   Medications:   aspirin EC  81 mg Oral Daily   B-complex with vitamin C  1 tablet Oral Daily   carvedilol  3.125 mg Oral BID WC   Chlorhexidine Gluconate Cloth  6 each Topical BID   cholecalciferol  2,000 Units Oral Daily   clopidogrel  75 mg Oral Daily   heparin  5,000 Units Subcutaneous Q8H   insulin aspart  0-6 Units Subcutaneous TID WC   insulin glargine  5 Units Subcutaneous Daily   levETIRAcetam  500 mg Oral QHS   mometasone-formoterol  2 puff Inhalation BID    pantoprazole  40 mg Oral Daily   rosuvastatin  20 mg Oral Daily   senna-docusate  2 tablet Oral QHS   sevelamer carbonate  800 mg Oral TID WC

## 2021-05-08 NOTE — Progress Notes (Signed)
Staff called to pt room for pt not responding to touch, voice. Pt required hard stimulation with sternal rub. Pt aroused, then fell back asleep. Pt was able to respond to commands and answer questions. Pt A+Ox4 but very lethargic, pt stated she did not sleep well last night. Assessment completed. Vitals obtained. Charge nurse notified.   05/08/21 1121  Vitals  Temp 97.6 F (36.4 C)  Temp Source Oral  BP (!) 171/75  MAP (mmHg) 103  BP Method Automatic  Patient Position (if appropriate) Lying  Pulse Rate 70  Resp 17  MEWS COLOR  MEWS Score Color Green  Oxygen Therapy  SpO2 100 %  O2 Device Room Air  MEWS Score  MEWS Temp 0  MEWS Systolic 0  MEWS Pulse 0  MEWS RR 0  MEWS LOC 0  MEWS Score 0

## 2021-05-08 NOTE — Plan of Care (Signed)
  Problem: RH Tub/Shower Transfers Goal: LTG Patient will perform tub/shower transfers w/assist (OT) Description: LTG: Patient will perform tub/shower transfers with assist, with/without cues using equipment (OT) 05/08/2021 1503 by Volanda Napoleon, OT Outcome: Not Applicable Flowsheets (Taken 05/08/2021 1503) LTG: Pt will perform tub/shower stall transfers with assistance level of: (Pt not cleared to shower medically.) -- Note: Pt not cleared to shower medically. 05/08/2021 0854 by Volanda Napoleon, OT Flowsheets (Taken 05/08/2021 [phone]) LTG: Pt will perform tub/shower stall transfers with assistance level of: Supervision/Verbal cueing

## 2021-05-08 NOTE — Progress Notes (Signed)
Physical Therapy Session Note  Patient Details  Name: Carolyn Russo MRN: [id number] Date of Birth: 1951-08-12  Today's Date: 05/08/2021 PT Missed Time: 62 Minutes Missed Time Reason: Patient fatigue  Pt received supine in bed and requesting to rest until dinner arrives. Missed 30 minutes of scheduled physical therapy.  Tawana Scale , PT, DPT, NCS, CSRS 05/08/2021, 6:00 PM

## 2021-05-08 NOTE — Progress Notes (Signed)
Pt sitting up in bed eating lunch with husband at bedside. Pt alert and oriented. Sheela Stack, LPN

## 2021-05-08 NOTE — Evaluation (Signed)
Occupational Therapy Assessment and Plan  Patient Details  Name: Carolyn Russo MRN: [id number] Date of Birth: [birth year]  OT Diagnosis: abnormal posture, cognitive deficits, hemiplegia affecting non-dominant side, muscle weakness (generalized), and decreased activity tolerance, postural control, BUE weakness, decreased BUE [place] and gross motor coordination Rehab Potential: Rehab Potential (ACUTE ONLY): Fair ELOS: 2-2.5 weeks   Today's Date: 05/08/2021 OT Individual Time: 6295-2841 OT Individual Time Calculation (min): 68 min     Hospital Problem: Principal Problem:   Basal ganglia infarction ([place]) Active Problems:   Cerebrovascular accident (CVA) of right basal ganglia ([place])   Past Medical History:  Past Medical History:  Diagnosis Date   Acute GI bleeding    Allergy    Anemia    Anterior chest wall pain    Appendicitis 1965   Asthma    Body mass index 37.0-37.9, adult    Breast pain    Cataract    both eyes   CHF (congestive heart failure) ([place])    Chronic kidney disease    stage 5 - on dialysis   Cognitive change 04/20/2021   r/t cva 03/2821   Dehydration 2014   Deviated septum 1971   Diabetes mellitus    no meds   Dyspnea 2014   Extrinsic asthma    WITH ASTHMA ATTACK   Fibroid 1980   GERD (gastroesophageal reflux disease)    Heart murmur    Hx gestational diabetes    Hyperlipidemia    Hypertension 2014   Inguinal hernia 1959   Malaise and fatigue 2014   Non-IgE mediated allergic asthma 2014   Obesity    Pelvic pain    Pregnancy, high-risk 1985   Stroke ([place]) 04/20/2021   (CVA) of right basal ganglia   Tonsillitis 1968   Uterine fibroid 1980   Past Surgical History:  Past Surgical History:  Procedure Laterality Date   APPENDECTOMY  3244   BASCILIC VEIN TRANSPOSITION Right 04/30/2021   Procedure: RIGHT FIRST STAGE [place];  Surgeon: Angelia Mould, MD;  Location: [facility] OR;  Service: Vascular;  Laterality: Right;   CESAREAN  SECTION  1985   COLONOSCOPY     ESOPHAGOGASTRODUODENOSCOPY (EGD) WITH PROPOFOL N/A 04/18/2021   Procedure: ESOPHAGOGASTRODUODENOSCOPY (EGD) WITH PROPOFOL;  Surgeon: Gatha Mayer, MD;  Location: [facility] ENDOSCOPY;  Service: Endoscopy;  Laterality: N/A;   EYE SURGERY     bilateral cataract    FLEXIBLE SIGMOIDOSCOPY N/A 04/18/2021   Procedure: FLEXIBLE SIGMOIDOSCOPY;  Surgeon: Gatha Mayer, MD;  Location: [facility] ENDOSCOPY;  Service: Endoscopy;  Laterality: N/A;   HERNIA REPAIR  1959   IR FLUORO GUIDE CV LINE RIGHT  03/18/2021   IR US GUIDE VASC ACCESS RIGHT  03/18/2021   LEFT HEART CATH AND CORONARY ANGIOGRAPHY N/A 04/04/2017   Procedure: Left Heart Cath and Coronary Angiography;  Surgeon: Belva Crome, MD;  Location: [facility] LAB;  Service: Cardiovascular;  Laterality: N/A;   MYOMECTOMY  1980, 2004, 2007   RHINOPLASTY  1971   ROTATOR CUFF REPAIR  2003   SURGICAL REPAIR OF HEMORRHAGE  2015   TONSILLECTOMY  1968    Assessment & Plan Clinical Impression: Patient is a 70 y.o. year old female with  PMH of ESRD on HD MWF, lower GIB, DM-2 HTN, HLD, diastolic CHF and recent hospitalization from 6/24-6/28 for acute CVA with left-sided weakness and ABLA due to lower GIB returning from CIR earlier this morning with "left-sided weakness and altered mental status.  The plan was to  start aspirin in about a week in the setting of GI bleed.   Apparently, rapid response called due to confusion and restlessness after dialysis on 7/13.  Code stroke activated.  Neurology evaluated patient and recommended admission to medical floor and continuing aspirin, Plavix, Crestor and SBP > 120 out of concern for acute watershed CVA.  Patient had CT head, CTA head and neck without acute finding.  CT perfusion study failed due to technical difficulty.   Patient transferred to CIR on 05/07/2021 .    Patient currently requires mod with basic self-care skills secondary to muscle weakness, decreased cardiorespiratoy endurance,  impaired timing and sequencing, abnormal tone, unbalanced muscle activation, decreased coordination, and decreased motor planning, decreased attention, decreased awareness, decreased problem solving, decreased safety awareness, decreased memory, and delayed processing, and decreased sitting balance, decreased standing balance, decreased postural control, hemiplegia, and decreased balance strategies.  Prior to hospitalization, patient could complete BADL  with min.  Patient will benefit from skilled intervention to decrease level of assist with basic self-care skills and increase independence with basic self-care skills prior to discharge home with care partner.  Anticipate patient will require intermittent supervision and minimal physical assistance and follow up home health.  OT - End of Session Activity Tolerance: Tolerates 10 - 20 min activity with multiple rests Endurance Deficit: Yes Endurance Deficit Description: frequent rest breaks due to fatigue and increased time to complete ADL OT Assessment Rehab Potential (ACUTE ONLY): Fair OT Barriers to Discharge: Inaccessible home environment;Decreased caregiver support OT Patient demonstrates impairments in the following area(s): Balance;Motor;Sensory;Skin Integrity;Perception;Safety;Endurance;Cognition;Vision OT Basic ADL's Functional Problem(s): Eating;Grooming;Bathing;Dressing;Toileting OT Transfers Functional Problem(s): Toilet OT Additional Impairment(s): Fuctional Use of Upper Extremity OT Plan OT Intensity: Minimum of 1-2 x/day, 45 to 90 minutes OT Frequency: 5 out of 7 days OT Duration/Estimated Length of Stay: 2-2.5 weeks OT Treatment/Interventions: Balance/vestibular training;Discharge planning;Pain management;Self Care/advanced ADL retraining;Therapeutic Activities;UE/LE Coordination activities;Visual/perceptual remediation/compensation;Therapeutic Exercise;Skin care/wound managment;Patient/family education;Functional mobility  training;Disease mangement/prevention;Cognitive remediation/compensation;Community reintegration;DME/adaptive equipment instruction;Neuromuscular re-education;Psychosocial support;Splinting/orthotics;UE/LE Strength taining/ROM;Wheelchair propulsion/positioning;Functional electrical stimulation OT Self Feeding Anticipated Outcome(s): mod I OT Basic Self-Care Anticipated Outcome(s): min A OT Toileting Anticipated Outcome(s): S OT Bathroom Transfers Anticipated Outcome(s): S OT Recommendation Patient destination: Home Follow Up Recommendations: Home health OT Equipment Recommended: To be determined   OT Evaluation Precautions/Restrictions  Precautions Precautions: Fall;Other (comment) Precaution Comments: L inattention, L hemiparesis, seizure protocol, impaired R UE coordination, R port a cath Restrictions Weight Bearing Restrictions: No General Chart Reviewed: Yes Family/Caregiver Present: No Pain Pain Assessment Pain Scale: 0-10 Pain Score: 0-No pain Home Living/Prior Functioning Home Living Available Help at Discharge: Family, Available PRN/intermittently Type of Home: House Home Access: Stairs to enter [employer] of Steps: 4STE from garage Entrance Stairs-Rails: Right Home Layout: Multi-level, Able to live on main level with bedroom/bathroom Bathroom Shower/Tub: Multimedia programmer: Standard Bathroom Accessibility: Yes Additional Comments: husband works, Secretary/administrator to hire help  Lives With: Spouse IADL History Homemaking Responsibilities: No Education: Retired Therapist, sports, 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, learning disability of Independence: Independent with gait, Independent with transfers, Requires assistive device for independence, Needs assistance with ADLs Dressing: Minimal  Able to Take Stairs?: Yes Driving: Yes Vocation: Retired Biomedical scientist: retired Therapist, sports and college professor Comments: was using cane PTA (had  prolonged recent hospitalization with resulting weakness and was receiving HHPT); cannot shower due to port-a-cath in rt upper chest Vision Baseline Vision/History: Wears glasses Wears Glasses: Reading only Patient Visual Report: No change from baseline Vision Assessment?: Yes Eye  Alignment:  (mild dysconjugate gaze on L) Ocular Range of Motion: Within Functional Limits Alignment/Gaze Preference: Gaze left;Head tilt (mild) Tracking/Visual Pursuits: Able to track stimulus in all quads without difficulty Saccades: Decreased speed of saccadic movement Convergence: Impaired - to be further tested in functional context Visual Fields: Impaired-to be further tested in functional context;Other (comment) (Presents as L visual field cut during testing, question effort/delayed verbal response. Able to locate all ADL items at sink.) Perception  Perception: Impaired Inattention/Neglect: Does not attend to left side of body;Does not attend to left visual field Praxis Praxis: Impaired Praxis Impairment Details: Motor planning Cognition Overall Cognitive Status: Impaired/Different from baseline Arousal/Alertness: Awake/alert Orientation Level: Place;Situation;Person Person: Oriented Place: Oriented Situation: Oriented Year: 2022 Month: July Day of Week: Correct Memory: Impaired Memory Impairment: Retrieval deficit;Decreased recall of new information Immediate Memory Recall: Blue;Bed;Sock Memory Recall Sock: Without Cue Memory Recall Blue: Not able to recall Memory Recall Bed: Not able to recall Attention: Sustained Focused Attention: Appears intact Sustained Attention: Impaired Awareness: Impaired Problem Solving: Impaired Safety/Judgment: Impaired Sensation Sensation Light Touch: Impaired by gross assessment Hot/Cold: Not tested Proprioception: Impaired by gross assessment Stereognosis: Impaired by gross assessment Additional Comments: Reports it feels like "glove" is on R hand post  AV fistula procedure, L hemiplegia Coordination Gross Motor Movements are Fluid and Coordinated: No Fine Motor Movements are Fluid and Coordinated: No Coordination and Movement Description: L hemiplegia LUE > LLE, gross motor movements impaired by decreased posture control, generalized deconditioning, and significant fatigue Finger Nose Finger Test: unable to complete on R, L mild dysmetria Motor  Motor Motor: Hemiplegia;Abnormal tone;Abnormal postural alignment and control Motor - Skilled Clinical Observations: L hemiplegia LUE > LLE, gross motor movements impaired by decreased posture control, generalized deconditioning, and significant fatigue  Trunk/Postural Assessment  Cervical Assessment Cervical Assessment: Exceptions to [facility] (forward head, mild L head tilt) Thoracic Assessment Thoracic Assessment: Exceptions to [facility] (kyphotic posture) Lumbar Assessment Lumbar Assessment: Exceptions to [facility] (posterior pelvic tilt) Postural Control Postural Control: Deficits on evaluation Righting Reactions: delayed/impaired  Balance Balance Balance Assessed: Yes Static Sitting Balance Static Sitting - Balance Support: No upper extremity supported;Feet supported Static Sitting - Level of Assistance: 5: Stand by assistance Static Sitting - Comment/# of Minutes: S Dynamic Sitting Balance Dynamic Sitting - Balance Support: No upper extremity supported;Feet supported;During functional activity Dynamic Sitting - Level of Assistance: 4: Min assist Dynamic Sitting - Balance Activities: Lateral lean/weight shifting;Reaching across midline;Reaching for objects Static Standing Balance Static Standing - Balance Support: Bilateral upper extremity supported;During functional activity Static Standing - Level of Assistance: 4: Min assist Dynamic Standing Balance Dynamic Standing - Balance Support: Bilateral upper extremity supported;During functional activity Dynamic Standing - Level of Assistance: 4: Min  assist Extremity/Trunk Assessment RUE Assessment RUE Assessment: Exceptions to [facility] Active Range of Motion (AROM) Comments: 3/4 to full shoulder flexion General Strength Comments: impaired gross/FMC post AV fistula procedure LUE Assessment LUE Assessment: Exceptions to [facility] LUE Body System: Neuro Brunstrum levels for arm and hand: Arm;Hand Brunstrum level for arm: Stage III Synergy is performed voluntarily;Stage IV Movement is deviating from synergy Brunstrum level for hand: Stage IV Movements deviating from synergies;Stage V Independence from basic synergies  Care Tool Care Tool Self Care Eating   Eating Assist Level: Minimal Assistance - Patient > 75%    Oral Care    Oral Care Assist Level: Minimal Assistance - Patient > 75%    Bathing   Body parts bathed by patient: Right arm;Left arm;Chest;Abdomen;Front perineal area;Right upper leg;Left  upper leg Body parts bathed by helper: Right lower leg;Left lower leg;Buttocks   Assist Level: Minimal Assistance - Patient > 75%    Upper Body Dressing(including orthotics)   What is the patient wearing?: Pull over shirt   Assist Level: Moderate Assistance - Patient 50 - 74%    Lower Body Dressing (excluding footwear)   What is the patient wearing?: Underwear/pull up;Pants Assist for lower body dressing: Maximal Assistance - Patient 25 - 49%    Putting on/Taking off footwear   What is the patient wearing?: Non-skid slipper socks Assist for footwear: Total Assistance - Patient < 25%       Care Tool Toileting Toileting activity   Assist for toileting: Maximal Assistance - Patient 25 - 49%     Care Tool Bed Mobility Roll left and right activity   Roll left and right assist level: Minimal Assistance - Patient > 75%    Sit to lying activity   Sit to lying assist level: Supervision/Verbal cueing    Lying to sitting edge of bed activity   Lying to sitting edge of bed assist level: Supervision/Verbal cueing     Care Tool  Transfers Sit to stand transfer   Sit to stand assist level: Minimal Assistance - Patient > 75%    Chair/bed transfer   Chair/bed transfer assist level: Minimal Assistance - Patient > 75%     Toilet transfer   Assist Level: Minimal Assistance - Patient > 75%     Care Tool Cognition Expression of Ideas and Wants Expression of Ideas and Wants: Some difficulty - exhibits some difficulty with expressing needs and ideas (e.g, some words or finishing thoughts) or speech is not clear   Understanding Verbal and Non-Verbal Content Understanding Verbal and Non-Verbal Content: Usually understands - understands most conversations, but misses some part/intent of message. Requires cues at times to understand   Memory/Recall Ability *first 3 days only Memory/Recall Ability *first 3 days only: Current season;That he or she is in a hospital/hospital unit    Refer to Care Plan for [place] 1 OT Short Term Goal 1 (Week 1): Pt will complete 2/3 steps of toileting with mod A. OT Short Term Goal 2 (Week 1): Pt will complete standing ADL with min A. OT Short Term Goal 3 (Week 1): Pt will don shirt with MIN A OT Short Term Goal 4 (Week 1): Pt will recall hemi dressing strategies with min VC  Recommendations for other services: None    Skilled Therapeutic Intervention ADL ADL Eating: Minimal assistance Where Assessed-Eating: Wheelchair Grooming: Minimal assistance Where Assessed-Grooming: Sitting at sink Upper Body Bathing: Minimal assistance Where Assessed-Upper Body Bathing: Sitting at sink Lower Body Bathing: Minimal assistance Where Assessed-Lower Body Bathing: Sitting at sink Upper Body Dressing: Moderate assistance Where Assessed-Upper Body Dressing: Sitting at sink Lower Body Dressing: Maximal assistance Where Assessed-Lower Body Dressing: Sitting at sink Toileting: Maximal assistance Where Assessed-Toileting: Glass blower/designer: Hydrologist Method: Arts development officer: Grab bars;Raised toilet seat;Bedside Neurosurgeon: Not assessed Social research officer, government: Not assessed Mobility  Bed Mobility Bed Mobility: Supine to Sit Supine to Sit: Supervision/Verbal cueing Transfers Sit to Stand: Minimal Assistance - Patient > 75%;Contact Guard/Touching assist Stand to Sit: Contact Guard/Touching assist;Minimal Assistance - Patient > 75%  Session Note: Pt received side-lying in bed, awakened to voice, denied pain and agreeable to OT eval. Reviewed role of CIR OT, evaluation process, ADL/func mobility retraining, goals for therapy,  and safety plan. Evaluation completed as documented above with session focus on func mobility, sink side dressing, bathing, toileting. Pt req heavy max A to boost up in bed, unable to assist. Came sitting EOB close S + use of bed rails. STS from elevated surface and use of RW with CGA and increased time. Stand-pivot no AD > w/c > toilet > bed. Unable to grasp grab/bar or RW well 2/2 decreased sensation/FMC on R hand post AV fistula procedure. Doffed hospital gown min A to remove LUE and untie, donned shirt with mod A to thread LUE and to pull down over trunk. Donned brief and pants with max A to thread BLE and to pull over L hip. Completed oral care with close S and set-up of materials, but anticipate will need overall min A for thoroughness of grooming tasks 2/2 great difficulty manipulating ADL objects. Reports need to void, max A for LB clothing management and seated pericare close S. Reports having voided, but no void. Stand-pivot back to bed, req to remain seated EOB. NT notified of pt position.  Pt left seated EOB, bed alarm engaged, call bell in reach, and all immediate needs met.   Discharge Criteria: Patient will be discharged from OT if patient refuses treatment 3 consecutive times without medical reason, if treatment goals not met, if there is a change in medical status,  if patient makes no progress towards goals or if patient is discharged from hospital.  The above assessment, treatment plan, treatment alternatives and goals were discussed and mutually agreed upon: by patient  Volanda Napoleon MS, OTR/L  05/08/2021, 8:52 AM

## 2021-05-08 NOTE — Progress Notes (Signed)
PROGRESS NOTE     Subjective/Complaints:     Pt reports no abdominal pain; no nausea- feeling actually "pretty good". LBM overnight.  Feeling much better right now than before left rehab initially.      ROS:  Pt denies SOB, abd pain, CP, N/V/C/D, and vision changes  Objective:       Physical Exam: Vital Signs         General: awake, alert, appropriate,  sitting up getting shirt on with CGA with OT; up at sink; NAD HENT: conjugate gaze; oropharynx moist CV: regular rate; no JVD Pulmonary: CTA B/L; no W/R/R- good air movement GI: soft, NT, ND, (+)BS- hyperactive slightly Psychiatric: more appropriate, delayed, but more interactive;  Neurological: more alert  Musculoskeletal:        General: No swelling.    Cervical back: Normal range of motion.    Comments: Mild tenderness left shoulder with ER/IR today  Skin:    General: Skin is warm and dry. Neurological:    Comments: alert, follows commands.   Fair insight and awareness. Provided biographical information. Normal language, speech slightly slurred. Mild left central 7. LUE 2 to 2+/5 deltoid, 3/5 biceps, triceps and 3+/5 wrist and HI. LLE 3-4/5 prox to distal. RUE and RLE 4-5/5 prox to distal. No sensory deficits noted--motor/sensory exam stable today.     Assessment/Plan: 1. Functional deficits which require 3+ hours per day of interdisciplinary therapy in a comprehensive inpatient rehab setting. Physiatrist is providing close team supervision and 24 hour management of active medical problems listed below. Physiatrist and rehab team continue to assess barriers to discharge/monitor patient progress toward functional and medical goals   Care Tool:   Bathing   Body parts bathed by patient: Right arm, Left arm, Chest, Abdomen, Front perineal area, Right upper leg, Left upper leg    Body parts bathed by helper: Right lower leg, Left lower leg, Buttocks     Bathing assist Assist Level: Minimal Assistance - Patient > 75%    Upper Body Dressing/Undressing Upper body dressing What is the patient wearing?: Pull over shirt   Upper body assist Assist Level: Minimal Assistance - Patient > 75%   Lower Body Dressing/Undressing Lower body dressing       What is the patient wearing?: Pants, Incontinence brief        Lower body assist Assist for lower body dressing: Minimal Assistance - Patient > 75%     Toileting Toileting   Toileting assist Assist for toileting: Moderate Assistance - Patient 50 - 74%    Transfers Chair/bed transfer   Transfers assist     Chair/bed transfer assist level: Minimal Assistance - Patient > 75%    Locomotion Ambulation     Ambulation assist       Assist level: Minimal Assistance - Patient > 75% Assistive device: Walker-rolling Max distance: 50'    Walk 10 feet activity     Assist     Assist level: Minimal Assistance - Patient > 75% Assistive device: Walker-rolling    Walk 50 feet activity     Assist Walk 50 feet with 2 turns activity did not occur: Safety/medical concerns   Assist level: Minimal  Assistance - Patient > 75% Assistive device: Walker-rolling      Walk 150 feet activity     Assist Walk 150 feet activity did not occur: Safety/medical concerns        Walk 10 feet on uneven surface activity     Assist Walk 10 feet on uneven surfaces activity did not occur: Safety/medical concerns         Wheelchair         Assist Will patient use wheelchair at discharge?:  (TBD) Type of Wheelchair: Manual   Wheelchair assist level: Dependent - Patient 0% Max wheelchair distance: 150'      Wheelchair 50 feet with 2 turns activity       Assist           Assist Level: Dependent - Patient 0%    Wheelchair 150 feet activity       Assist       Assist Level: Dependent - Patient 0%     Medical Problem List and Plan: 1.  Left side weakness secondary to  nonhemorrhagic right basal ganglia infarction             -patient may shower             --con't PT, OT and SLP- pt was CGA- min A to put shirt on today 2.  Impaired mobility: -DVT/anticoagulation:  continue SCD             -antiplatelet therapy:ASA             Plavix restarted. 3. Right hip bursitis: continue Tramadol 50 mg every 12 hours as needed, ice, ROM, lidocaine patch 4. Anxiety: continue Xanax 0.5 mg 3 times daily as needed.  Provide emotional support             7/12- per husband, concerned about flat affect- will d/w possible depression with pt- if so, will start SSRI.              7/13- will wait for now per husband  7/16- will d/w husband when sees next?             -antipsychotic agents: N/A 5. Neuropsych: This patient is ?capable of making decisions on her own behalf. 6. Skin/Wound Care: Routine skin checks 7. Fluids/Electrolytes/Nutrition: Routine in and outs with follow-up chemistries 8.  Acute lower GI bleed with blood loss anemia.  Follow-up GI services.  Resume ASA as above. - Continue Protonix 40 mg twice daily 7/6: hgb 9.6--monitor for gross bleeding 7/11- last Hb stable at 9.8- will recheck this week             7/12- Hb came back at 9.7- stable- con't regimen             7/13- Hb up to 11.1- will con't to monitor 9.  Chronic diastolic congestive heart failure.  Monitor for any signs of fluid overload.  Continue Lasix 40-80 mg Monday Wednesday Friday             -daily weights, have been stable             7/12- weight bounced up 1 kg today, however was this level 2-3 days ago, so think yesterday's weight might have been slightly off- will monitor  7/16- weight much better- con't to monitor 10.  Newly Dx'd End-stage renal disease.  Hemodialysis as per renal services             7/12- questionable steal syndrome since Fistula placement-  will monitor per Vascular- to see if  needs additional intervention 11.  Hypertension.  Increase Coreg to 12.5 mg twice daily.  Aggressive filtration with ultradialysis tomorrow             7/11- HD today- BP 170s/90s this AM- per renal.             7/12- BP dropped to 70s/50s with HD yesterday- back up to 150s/80s today- con't reigmen 12.  Asthma.  Continue inhalers as directed             - was getting Home O2 prior to admission- will likely need to go home on 2L O2-   Was trying to get at home, but hadn't yet.             7/1- con't nebs prn, home O2 ?PRN only             7/3 lungs very clear today, sats 99% on RA. Some of SOB may be behaviorally related. Reassured pt today that lungs sounded fine.                         -encouraged FV/IS, OOB             7/7: ordered CXT-stable, ordered incentive spirometer 13.Hyperlipidemia.Crestor 14.Obesity.BMI 34.31.Dietery follow up 15.  Diabetes mellitus.hemoglobin AIc 5.2.  CBGs 211-310. Increase Lantus to 13U BID.  16. Leukocytosis: trending down, repeat Monday             7/11- last WBC was 13k- will recheck in HD today.              7/13- WBC down to 10.1- will monitor  7/16- Last WBC 12.1- was 12.8 last week- bounced down and went back up again?- con't to monitor 17. Constipation: KUB shows moderate stool burden. Increase senna-docusate to BID.              7/13- LBM yesterday- con't regimen  18. Fistula placement: successful, new fistula accessed without complications. 19. Left shoulder pain: ordered lidocaine patch and voltaren gel. 20. Lethargy             7/12- is much improved today- will con't to monitor- CT and CXR of chest were stable             7/16- will readd Melatonin for sleep- never had the chance to use it.

## 2021-05-08 NOTE — Evaluation (Signed)
Physical Therapy Assessment and Plan  Patient Details  Name: Carolyn Russo MRN: [id number] Date of Birth: 08/15/1951  PT Diagnosis: Abnormal posture, Abnormality of gait, Cognitive deficits, Difficulty walking, Hemiparesis non-dominant, Impaired cognition, Impaired sensation, and Muscle weakness Rehab Potential: Fair ELOS: ~2 weeks   Today's Date: 05/08/2021 PT Individual Time: 1779-3903 PT Individual Time Calculation (min): 45 min    Hospital Problem: Principal Problem:   Basal ganglia infarction ([place]) Active Problems:   Cerebrovascular accident (CVA) of right basal ganglia ([place])   Past Medical History:  Past Medical History:  Diagnosis Date   Acute GI bleeding    Allergy    Anemia    Anterior chest wall pain    Appendicitis 1965   Asthma    Body mass index 37.0-37.9, adult    Breast pain    Cataract    both eyes   CHF (congestive heart failure) ([place])    Chronic kidney disease    stage 5 - on dialysis   Cognitive change 04/20/2021   r/t cva 03/2821   Dehydration 2014   Deviated septum 1971   Diabetes mellitus    no meds   Dyspnea 2014   Extrinsic asthma    WITH ASTHMA ATTACK   Fibroid 1980   GERD (gastroesophageal reflux disease)    Heart murmur    Hx gestational diabetes    Hyperlipidemia    Hypertension 2014   Inguinal hernia 1959   Malaise and fatigue 2014   Non-IgE mediated allergic asthma 2014   Obesity    Pelvic pain    Pregnancy, high-risk 1985   Stroke ([place]) 04/20/2021   (CVA) of right basal ganglia   Tonsillitis 1968   Uterine fibroid 1980   Past Surgical History:  Past Surgical History:  Procedure Laterality Date   APPENDECTOMY  0092   BASCILIC VEIN TRANSPOSITION Right 04/30/2021   Procedure: RIGHT FIRST STAGE [place];  Surgeon: Angelia Mould, MD;  Location: [facility] OR;  Service: Vascular;  Laterality: Right;   CESAREAN SECTION  1985   COLONOSCOPY     ESOPHAGOGASTRODUODENOSCOPY (EGD) WITH PROPOFOL N/A 04/18/2021    Procedure: ESOPHAGOGASTRODUODENOSCOPY (EGD) WITH PROPOFOL;  Surgeon: Gatha Mayer, MD;  Location: [facility] ENDOSCOPY;  Service: Endoscopy;  Laterality: N/A;   EYE SURGERY     bilateral cataract    FLEXIBLE SIGMOIDOSCOPY N/A 04/18/2021   Procedure: FLEXIBLE SIGMOIDOSCOPY;  Surgeon: Gatha Mayer, MD;  Location: [facility] ENDOSCOPY;  Service: Endoscopy;  Laterality: N/A;   HERNIA REPAIR  1959   IR FLUORO GUIDE CV LINE RIGHT  03/18/2021   IR US GUIDE VASC ACCESS RIGHT  03/18/2021   LEFT HEART CATH AND CORONARY ANGIOGRAPHY N/A 04/04/2017   Procedure: Left Heart Cath and Coronary Angiography;  Surgeon: Belva Crome, MD;  Location: [place] CV LAB;  Service: Cardiovascular;  Laterality: N/A;   MYOMECTOMY  1980, 2004, 2007   RHINOPLASTY  1971   ROTATOR CUFF REPAIR  2003   SURGICAL REPAIR OF HEMORRHAGE  2015   TONSILLECTOMY  1968    Assessment & Plan Clinical Impression: Patient is a 70 y.o. year old female with PMH of ESRD on HD MWF, lower GIB, DM-2 HTN, HLD, diastolic CHF and recent hospitalization from 6/24-6/28 for acute CVA with left-sided weakness and ABLA due to lower GIB returning from CIR earlier this morning with "left-sided weakness and altered mental status.  The plan was to start aspirin in about a week in the setting of GI bleed.  Apparently, rapid response called due to confusion and restlessness after dialysis on 7/13.  Code stroke activated.  Neurology evaluated patient and recommended admission to medical floor and continuing aspirin, Plavix, Crestor and SBP > 120 out of concern for acute watershed CVA.  Patient had CT head, CTA head and neck without acute finding.  CT perfusion study failed due to technical difficulty.  Patient transferred to CIR on 05/07/2021 .   Patient currently requires min/mod  assist with mobility secondary to muscle weakness, decreased cardiorespiratoy endurance, impaired timing and sequencing, unbalanced muscle activation, and decreased motor planning, decreased  attention to left, decreased initiation, decreased attention, decreased awareness, decreased problem solving, decreased safety awareness, decreased memory, and delayed processing, and decreased sitting balance, decreased standing balance, decreased postural control, hemiplegia, and decreased balance strategies.  Prior to hospitalization, patient was modified independent  with mobility and lived with Spouse in a House home.  Home access is 4STE from garage.  Patient will benefit from skilled PT intervention to maximize safe functional mobility, minimize fall risk, and decrease caregiver burden for planned discharge home with 24 hour assist.  Anticipate patient will benefit from follow up [facility] at discharge.  PT - End of Session Activity Tolerance: Tolerates 30+ min activity with multiple rests Endurance Deficit: Yes Endurance Deficit Description: frequent rest breaks due to fatigue with pt quickly falling asleep PT Assessment Rehab Potential (ACUTE/IP ONLY): Fair PT Barriers to Discharge: Decreased caregiver support;Home environment access/layout;Incontinence;Lack of/limited family support;Hemodialysis PT Patient demonstrates impairments in the following area(s): Balance;Safety;Behavior;Sensory;Edema;Skin Integrity;Endurance;Motor;Nutrition;Pain;Perception PT Transfers Functional Problem(s): Bed Mobility;Bed to Chair;Car;Furniture;Floor PT Locomotion Functional Problem(s): Ambulation;Stairs;Wheelchair Mobility PT Plan PT Intensity: Minimum of 1-2 x/day ,45 to 90 minutes PT Frequency: 5 out of 7 days PT Duration Estimated Length of Stay: ~2 weeks PT Treatment/Interventions: Ambulation/gait training;Balance/vestibular training;Cognitive remediation/compensation;Community reintegration;Discharge planning;Disease management/prevention;DME/adaptive equipment instruction;Functional electrical stimulation;Functional mobility training;Neuromuscular re-education;Patient/family education;Psychosocial  support;Splinting/orthotics;Stair training;Therapeutic Activities;Therapeutic Exercise;UE/LE Strength taining/ROM;UE/LE Coordination activities;Wheelchair propulsion/positioning;Pain management;Skin care/wound management;Visual/perceptual remediation/compensation PT Transfers Anticipated Outcome(s): supervision PT Locomotion Anticipated Outcome(s): CGA with LRAD PT Recommendation Follow Up Recommendations: Home health PT;24 hour supervision/assistance Patient destination: Home Equipment Recommended: To be determined   PT Evaluation Precautions/Restrictions Precautions Precautions: Fall;Other (comment) Precaution Comments: L inattention, L hemiparesis, seizure protocol, impaired R UE coordination, R port a cath Pain Pain Assessment Pain Scale: 0-10 Pain Score: 0-No pain Home Living/Prior Functioning Home Living Available Help at Discharge: Family;Available PRN/intermittently (planning to hire caregiver b/c husband works full time) Type of Home: House Home Access: Stairs to enter [employer] of Steps: 4STE from garage Entrance Stairs-Rails: Right Home Layout: Multi-level;Able to live on main level with bedroom/bathroom  Lives With: Spouse (husband, [name]) Prior Function Level of Independence: Independent with gait;Independent with transfers;Requires assistive device for independence  Able to Take Stairs?: Yes Driving: Yes Vocation: Retired Biomedical scientist: retired Therapist, sports and college professor Comments: was using cane PTA (had prolonged recent hospitalization with resulting weakness and was receiving HHPT); cannot shower due to port-a-cath in rt upper chest Perception  Perception Perception: Impaired Inattention/Neglect: Does not attend to left side of body;Does not attend to left visual field Praxis Praxis: Impaired Praxis Impairment Details: Motor planning  Cognition Overall Cognitive Status: Impaired/Different from baseline Arousal/Alertness:  Awake/alert Orientation Level: Oriented to person;Oriented to place;Oriented to situation;Disoriented to time (states is it July 23rd but does state correct year) Attention: Focused;Sustained Focused Attention: Appears intact Sustained Attention: Impaired Memory: Impaired Awareness: Impaired Problem Solving: Impaired Safety/Judgment: Impaired Sensation Sensation Light Touch: Impaired by gross assessment Hot/Cold: Not tested  Proprioception: Impaired by gross assessment Stereognosis: Not tested Coordination Gross Motor Movements are Fluid and Coordinated: No Coordination and Movement Description: gross motor movements impaired by L hemiplegia LUE > LLE, decreased posture control, generalized deconditioning, and significant fatigue Motor  Motor Motor: Hemiplegia;Abnormal tone;Abnormal postural alignment and control Motor - Skilled Clinical Observations: L hemiplegia LUE > LLE, generalized deconditioning   Trunk/Postural Assessment  Cervical Assessment Cervical Assessment: Exceptions to [facility] (forward head) Thoracic Assessment Thoracic Assessment: Exceptions to [facility] (rounded shoulders with mild kyphosis) Lumbar Assessment Lumbar Assessment: Exceptions to [facility] (posterior pelvic tilt) Postural Control Postural Control: Deficits on evaluation Trunk Control: impaired, L lateral lean in sitting and standing Righting Reactions: delayed/impaired  Balance Balance Balance Assessed: Yes Static Sitting Balance Static Sitting - Balance Support: No upper extremity supported;Feet supported Static Sitting - Level of Assistance: 5: Stand by assistance Dynamic Sitting Balance Dynamic Sitting - Balance Support: No upper extremity supported;Feet supported;During functional activity Dynamic Sitting - Level of Assistance: 4: Min Insurance risk surveyor Standing - Balance Support: Bilateral upper extremity supported;During functional activity Static Standing - Level of Assistance: 4: Min  assist Dynamic Standing Balance Dynamic Standing - Balance Support: Bilateral upper extremity supported;During functional activity Dynamic Standing - Level of Assistance: 3: Mod assist Extremity Assessment   RLE Assessment RLE Assessment: Exceptions to [facility] Passive Range of Motion (PROM) Comments: WFL General Strength Comments: lethargy also impacting strength RLE Strength Right Hip Flexion: 3+/5 Right Knee Flexion: 3+/5 Right Knee Extension: 4-/5 Right Ankle Dorsiflexion: 4-/5 Right Ankle Plantar Flexion: 4-/5 LLE Assessment LLE Assessment: Exceptions to [facility] Passive Range of Motion (PROM) Comments: WFL General Strength Comments: lethargy also impacting strength LLE Strength Left Hip Flexion: 4-/5 Left Knee Flexion: 3+/5 Left Knee Extension: 3+/5 Left Ankle Dorsiflexion: 4-/5 Left Ankle Plantar Flexion: 4-/5  Care Tool Care Tool Bed Mobility Roll left and right activity   Roll left and right assist level: Minimal Assistance - Patient > 75%    Sit to lying activity   Sit to lying assist level: Moderate Assistance - Patient 50 - 74%    Lying to sitting edge of bed activity   Lying to sitting edge of bed assist level: Minimal Assistance - Patient > 75%     Care Tool Transfers Sit to stand transfer   Sit to stand assist level: Moderate Assistance - Patient 50 - 74%    Chair/bed transfer   Chair/bed transfer assist level: Moderate Assistance - Patient 50 - 74%     Product manager transfer assist level: Moderate Assistance - Patient 50 - 74%      Care Tool Locomotion Ambulation   Assist level: Minimal Assistance - Patient > 75% Assistive device: Walker-rolling Max distance: 27f  Walk 10 feet activity   Assist level: Minimal Assistance - Patient > 75% Assistive device: Walker-rolling   Walk 50 feet with 2 turns activity Walk 50 feet with 2 turns activity did not occur: Safety/medical concerns      Walk 150 feet activity Walk 150 feet  activity did not occur: Safety/medical concerns      Walk 10 feet on uneven surfaces activity   Assist level: Minimal Assistance - Patient > 75% (seated rest break during) Assistive device: Walker-rolling  Stairs   Assist level: Moderate Assistance - Patient - 50 - 74% Stairs assistive device: 2 hand rails Max number of stairs: 4  Walk up/down 1 step activity   Walk up/down 1  step (curb) assist level: Moderate Assistance - Patient - 50 - 74% Walk up/down 1 step or curb assistive device: 2 hand rails    Walk up/down 4 steps activity Walk up/down 4 steps assist level: Moderate Assistance - Patient - 50 - 74% Walk up/down 4 steps assistive device: 2 hand rails  Walk up/down 12 steps activity Walk up/down 12 steps activity did not occur: Safety/medical concerns      Pick up small objects from floor Pick up small object from the floor (from standing position) activity did not occur: Safety/medical concerns      Wheelchair Will patient use wheelchair at discharge?:  (TBD)          Wheel 50 feet with 2 turns activity      Wheel 150 feet activity        Refer to Care Plan for Long Term Goals  SHORT TERM GOAL WEEK 1 PT Short Term Goal 1 (Week 1): Pt will perform supine<>sit with min assist PT Short Term Goal 2 (Week 1): Pt will perform sit<>stands using LRAD with CGA PT Short Term Goal 3 (Week 1): Pt will perform bed<>chair transfers using LRAD with CGA PT Short Term Goal 4 (Week 1): Pt will ambulate at least 14f using LRAD with min assist PT Short Term Goal 5 (Week 1): Pt will ascend/descend 4 steps using B HRs with min assist  Recommendations for other services: None   Skilled Therapeutic Intervention Pt received sitting EOB eating breakfast and agreeable to therapy session. Evaluation completed (see details above) with patient education regarding purpose of PT evaluation, PT POC and goals, therapy schedule, weekly team meetings, and other CIR information including safety plan  and fall risk safety. Pt requires assistance with meal due to B UE weakness and impaired coordination as well as L hemibody inattention - therapist donning foam grips onto utensils to increase pt independence with this task but still requires hand-over-hand assistance with certain tasks especially those that require in-hand manipulation of the utensil. After eating breakfast pt becomes much more lethargic, frequently falling asleep during session but with verbal and tactile cuing is able to awaken and continue participating in mobility tasks. Pt completes the below functional mobility tasks with the specified levels of assistance. During gait training, pt demos L lateral lean and poor AD management requiring assistance to steer RW and cuing for improved upright posture. During stair navigation pt requires max cuing for sequencing as this is a very challenging task for pt's motor planning and sequencing - pt self-selected reciprocal pattern on ascent and then therapist cued for step-to pattern leading with L LE on descent; however, pt led with R LE 1x and no L knee buckling noted - therapist cuing for advancement of L hand on HRs due to inattention to this. During gait training on ramp pt required seated rest break at the top prior to ambulating back down. Pt requires very frequent seated rest breaks due to fatigue. At end of session, once pt back in room she noted to be in a deep sleep - pt has pulse and is breathing; however, therapist unable to arouse her with loud verbal stimulus nor tactile stimulus therefore notified nurse who was able to awaken pt with sternal rub. L squat pivot w/c>EOB with mod assist of 1 and +2 for safety - pt awakening more during transfer. Sit>supine with mod assist for trunk descent and B LE management onto bed. Pt quickly falling back asleep once in supine - left in care  of Caryl Pina, LPN with needs in reach and bed alarm on.  Mobility Bed Mobility Bed Mobility: Sit to Supine;Supine to  Sit Supine to Sit: Minimal Assistance - Patient > 75% Sit to Supine: Moderate Assistance - Patient 50-74% Transfers Transfers: Sit to Stand;Stand to Sit;Stand Pivot Transfers Sit to Stand: Minimal Assistance - Patient > 75%;Moderate Assistance - Patient 50-74% Stand to Sit: Minimal Assistance - Patient > 75% Stand Pivot Transfers: Minimal Assistance - Patient > 75%;Moderate Assistance - Patient 50 - 74% Stand Pivot Transfer Details: Tactile cues for sequencing;Tactile cues for initiation;Verbal cues for sequencing;Verbal cues for gait pattern;Verbal cues for technique;Verbal cues for safe use of DME/AE;Visual cues/gestures for sequencing;Verbal cues for precautions/safety;Manual facilitation for weight shifting Transfer (Assistive device): Rolling walker Locomotion  Gait Ambulation: Yes Gait Assistance: Minimal Assistance - Patient > 75% Gait Distance (Feet): 30 Feet Assistive device: Rolling walker Gait Assistance Details: Tactile cues for weight shifting;Verbal cues for technique;Verbal cues for sequencing;Verbal cues for gait pattern;Verbal cues for safe use of DME/AE;Visual cues/gestures for sequencing;Manual facilitation for weight shifting;Verbal cues for precautions/safety;Tactile cues for sequencing;Tactile cues for posture Gait Gait: Yes Gait Pattern: Impaired Gait Pattern: Trunk flexed;Lateral trunk lean to left;Decreased step length - left;Decreased step length - right Gait velocity: decreased Stairs / Additional Locomotion Stairs: Yes Stairs Assistance: Moderate Assistance - Patient 50 - 74% Stair Management Technique: Two rails Number of Stairs: 4 Height of Stairs: 6 Ramp: Minimal Assistance - Patient >75% (using RW with pt requiring seated rest break at top of ramp prior to ambulating back down) Wheelchair Mobility Wheelchair Mobility: No   Discharge Criteria: Patient will be discharged from PT if patient refuses treatment 3 consecutive times without medical reason, if  treatment goals not met, if there is a change in medical status, if patient makes no progress towards goals or if patient is discharged from hospital.  The above assessment, treatment plan, treatment alternatives and goals were discussed and mutually agreed upon: by patient  Tawana Scale , PT, DPT, NCS, CSRS 05/08/2021, 7:54 AM

## 2021-05-08 NOTE — Plan of Care (Signed)
  Problem: RH Balance Goal: LTG Patient will maintain dynamic standing with ADLs (OT) Description: LTG:  Patient will maintain dynamic standing balance with assist during activities of daily living (OT)  Flowsheets (Taken 05/08/2021 0854) LTG: Pt will maintain dynamic standing balance during ADLs with: Supervision/Verbal cueing   Problem: Sit to Stand Goal: LTG:  Patient will perform sit to stand in prep for activites of daily living with assistance level (OT) Description: LTG:  Patient will perform sit to stand in prep for activites of daily living with assistance level (OT) Flowsheets (Taken 05/08/2021 0854) LTG: PT will perform sit to stand in prep for activites of daily living with assistance level: Supervision/Verbal cueing   Problem: RH Eating Goal: LTG Patient will perform eating w/assist, cues/equip (OT) Description: LTG: Patient will perform eating with assist, with/without cues using equipment (OT) Flowsheets (Taken 05/08/2021 0854) LTG: Pt will perform eating with assistance level of: Independent with assistive device    Problem: RH Grooming Goal: LTG Patient will perform grooming w/assist,cues/equip (OT) Description: LTG: Patient will perform grooming with assist, with/without cues using equipment (OT) Flowsheets (Taken 05/08/2021 0854) LTG: Pt will perform grooming with assistance level of: Independent with assistive device    Problem: RH Bathing Goal: LTG Patient will bathe all body parts with assist levels (OT) Description: LTG: Patient will bathe all body parts with assist levels (OT) Flowsheets (Taken 05/08/2021 0854) LTG: Pt will perform bathing with assistance level/cueing: Supervision/Verbal cueing   Problem: RH Dressing Goal: LTG Patient will perform upper body dressing (OT) Description: LTG Patient will perform upper body dressing with assist, with/without cues (OT). Flowsheets (Taken 05/08/2021 0854) LTG: Pt will perform upper body dressing with assistance level of:  Supervision/Verbal cueing Goal: LTG Patient will perform lower body dressing w/assist (OT) Description: LTG: Patient will perform lower body dressing with assist, with/without cues in positioning using equipment (OT) Flowsheets (Taken 05/08/2021 0854) LTG: Pt will perform lower body dressing with assistance level of: Minimal Assistance - Patient > 75%   Problem: RH Toileting Goal: LTG Patient will perform toileting task (3/3 steps) with assistance level (OT) Description: LTG: Patient will perform toileting task (3/3 steps) with assistance level (OT)  Flowsheets (Taken 05/08/2021 0854) LTG: Pt will perform toileting task (3/3 steps) with assistance level: Minimal Assistance - Patient > 75%   Problem: RH Functional Use of Upper Extremity Goal: LTG Patient will use RT/LT upper extremity as a (OT) Description: LTG: Patient will use right/left upper extremity as a stabilizer/gross assist/diminished/nondominant/dominant level with assist, with/without cues during functional activity (OT) Flowsheets (Taken 05/08/2021 0854) LTG: Use of upper extremity in functional activities: LUE as diminished level LTG: Pt will use upper extremity in functional activity with assistance level of: Supervision/Verbal cueing   Problem: RH Toilet Transfers Goal: LTG Patient will perform toilet transfers w/assist (OT) Description: LTG: Patient will perform toilet transfers with assist, with/without cues using equipment (OT) Flowsheets (Taken 05/08/2021 0854) LTG: Pt will perform toilet transfers with assistance level of: Supervision/Verbal cueing   Problem: RH Tub/Shower Transfers Goal: LTG Patient will perform tub/shower transfers w/assist (OT) Description: LTG: Patient will perform tub/shower transfers with assist, with/without cues using equipment (OT) Flowsheets (Taken 05/08/2021 0854) LTG: Pt will perform tub/shower stall transfers with assistance level of: Supervision/Verbal cueing

## 2021-05-09 DIAGNOSIS — I639 Cerebral infarction, unspecified: Secondary | ICD-10-CM | POA: Diagnosis not present

## 2021-05-09 LAB — GLUCOSE, CAPILLARY
Glucose-Capillary: 267 mg/dL — ABNORMAL HIGH (ref 70–99)
Glucose-Capillary: 231 mg/dL — ABNORMAL HIGH (ref 70–99)
Glucose-Capillary: 266 mg/dL — ABNORMAL HIGH (ref 70–99)
Glucose-Capillary: 293 mg/dL — ABNORMAL HIGH (ref 70–99)

## 2021-05-09 MED ORDER — DARBEPOETIN ALFA 100 MCG/0.5ML IJ SOSY
100.0000 ug | PREFILLED_SYRINGE | INTRAMUSCULAR | Status: DC
  Filled 2021-05-09: qty 0.5

## 2021-05-09 MED ORDER — METHYLPHENIDATE HCL 5 MG PO TABS
2.5000 mg | ORAL_TABLET | Freq: Two times a day (BID) | ORAL | Status: DC
  Administered 2021-05-09 – 2021-05-11 (×5): 2.5 mg via ORAL
  Filled 2021-05-09 (×6): qty 1

## 2021-05-09 NOTE — Progress Notes (Addendum)
Arouses easily, carries conversation, A+Ox4, just drowsy and tired, did not sleep last night...per nurse was up all night. Pt remembers staff faces/names      05/09/21 1000  Neurological  Neuro (WDL) X  Orientation Level Oriented X4  Cognition Appropriate at baseline;Appropriate attention/concentration;Appropriate judgement;Appropriate safety awareness;Follows commands  Speech Slurred/Dysarthria;Clear  Pupil Assessment  Yes  R Pupil Size (mm) 3  R Pupil Shape Round  R Pupil Reaction Brisk  L Pupil Size (mm) 3  L Pupil Shape Round  L Pupil Reaction Brisk  Additional Pupil Assessments No  Seizure Activity  Psychomotor Symptoms None  Neurological  Level of Consciousness Alert

## 2021-05-09 NOTE — Progress Notes (Addendum)
Subjective: Awoken from sleep, states feels well ,except, nagging numbness in right hand, noted has been seen by the [facility].  Postop and she was offered option of ligation but she wanted to wait and again today also she wants to wait.  Hemodialysis tomorrow on schedule  Objective Vital signs in last 24 hours: Vitals:   05/09/21 0352 05/09/21 0730 05/09/21 0848 05/09/21 0922  BP: (!) 151/41  (!) 169/77   Pulse: 69  75   Resp: 20     Temp: (!) 97.4 F (36.3 C)     TempSrc: Oral     SpO2: 100% 100%  100%  Weight:      Height:       Weight change:    Physical Exam General: Alert elderly female in NAD Heart:RRR, no mrg Lungs:CTAB, nml WOB Abdomen:soft, NTND Extremities:no LE edema Dialysis Access: TDC, RU AVF maturing +b/t stable decreased grip compared to left    OP Dialysis Orders: [facility]-MWF 4 hrs 180NRe 400/Autoflow 1.5 86.5 kg 2.0K/2.0 Ca TDC -heparin 2000 units IV TIW -Mircera 100 mcg IV q 2 weeks (last dose 04/14/2021 -Hectorol 5 mcg IV TIW     Problem/Plan: Acute subcortical R CVA-per Primary/Neurology. Admitted back to hospital 7/14 due to AMS/stroke like symptoms.  Neurology reassessed and determined no acute stroke, but possibly watershed issue(I.e, hypotension related)Returned to CIR 7/15 as she was stable Acute LGIB- Hx hemorrhoidal bleeding in past. new onset 06/25 post clopidogrel load. s/p 2 units PRBCS on 04/18/21.  Clopidogrel and ASA on hold. No heparin with HD. Per GI: EGD and flex sig with no etiology and bleeding scan (-).  Hgb 10.6 last on 7/15 ESRD -  MWF schedule as an outpatient.  Off heparin d/t new GIB.  Had 1st stage R basilic vein transposition on 7/8 by Dr. Scot Dock.  Steal like symptoms seen postop 7/11 Dr. Carlis Abbott no intervention for now..  Noted she has been signing off early occasional, encouraged compliance.  AMS following HD 7/14. With  Low BP, now have will lower goal for HD, plan to keep even on Monday dialysis unless excess volume on exam or excessive  blood pressure elevation Hypertension/volume - BP's labile usually good, slightly high this a.m. 169/77, now on carvedilol 3.125 mg BID, post weight down 78kg 7/13, lowest weight here,/Suspect hypovolemia contributing to her episode on Wed.7/13  No fluid off 7/15 w/ HD, will keep + w/ NS boluses as needed, and let volume come up a few kg. Anemia CKD -Hgb 10.6 ( 7/15) On Aranesp 100 mcg weekly continue /Transfuse PRN. Metabolic bone disease -  PTH only 110 03/26/21 on 5 mcg Hectorol, hectorol was held at that time.  Rechecked pth 128. No need to restart VDRA.  Changed renvela to powder d/t patient inability to swallow pills.  Corrected calcium 9.6(7/15) noted also on cholecalciferol Nutrition - note dysphagia diet, tolerated diet right now currently alb 3.0  DTM2- per primary   Ernest Haber, PA-C [place] [phone] 05/09/2021,1:03 PM  LOS: 2 days   Pt seen, examined and agree w A/P as above.  Kelly Splinter  MD 05/09/2021, 4:52 PM   Labs: Basic Metabolic Panel: Recent Labs  Lab 05/03/21 1044 05/05/21 1131 05/07/21 0729  NA 131* 132* 135  K 4.8 4.2 4.2  CL 97* 94* 99  CO2 25 30 27   GLUCOSE 306* 123* 156*  BUN 21 14 22   CREATININE 5.90* 5.78* 6.99*  CALCIUM 8.6* 9.5 8.8*  PHOS 4.8* 5.1* 4.8*  Liver Function Tests: Recent Labs  Lab 05/03/21 1044 05/05/21 1131 05/07/21 0729  ALBUMIN 2.8* 3.3* 3.0*   No results for input(s): LIPASE, AMYLASE in the last 168 hours. No results for input(s): AMMONIA in the last 168 hours. CBC: Recent Labs  Lab 05/03/21 1044 05/05/21 1131 05/06/21 0456 05/07/21 0729  WBC 12.8* 10.1 8.8 12.1*  NEUTROABS 7.9*  --   --   --   HGB 9.7* 11.1* 10.6* 10.6*  HCT 29.5* 33.2* 31.0* 31.2*  MCV 88.6 87.4 88.6 87.4  PLT 299 391 235 370   Cardiac Enzymes: No results for input(s): CKTOTAL, CKMB, CKMBINDEX, TROPONINI in the last 168 hours. CBG: Recent Labs  Lab 05/08/21 1123 05/08/21 1619 05/08/21 2107 05/09/21 0602  05/09/21 1121  GLUCAP 203* 226* 274* 267* 231*    Studies/Results: No results found. Medications:   aspirin EC  81 mg Oral Daily   B-complex with vitamin C  1 tablet Oral Daily   carvedilol  3.125 mg Oral BID WC   Chlorhexidine Gluconate Cloth  6 each Topical BID   cholecalciferol  2,000 Units Oral Daily   clopidogrel  75 mg Oral Daily   heparin  5,000 Units Subcutaneous Q8H   insulin aspart  0-6 Units Subcutaneous TID WC   insulin glargine  5 Units Subcutaneous Daily   levETIRAcetam  500 mg Oral QHS   melatonin  3 mg Oral QHS   methylphenidate  2.5 mg Oral BID WC   mometasone-formoterol  2 puff Inhalation BID   pantoprazole  40 mg Oral Daily   rosuvastatin  20 mg Oral Daily   senna-docusate  2 tablet Oral QHS   sevelamer carbonate  800 mg Oral TID WC

## 2021-05-09 NOTE — Evaluation (Signed)
Speech Language Pathology Assessment and Plan  Patient Details  Name: Carolyn Russo MRN: [id number] Date of Birth: 1951-03-25  SLP Diagnosis: Dysarthria;Dysphagia;Cognitive Impairments  Rehab Potential: Good ELOS: 14-17 days    Today's Date: 05/09/2021 SLP Individual Time: 4967-5916 SLP Individual Time Calculation (min): 69 min   Hospital Problem: Principal Problem:   Basal ganglia infarction ([place]) Active Problems:   Cerebrovascular accident (CVA) of right basal ganglia ([place])  Past Medical History:  Past Medical History:  Diagnosis Date   Acute GI bleeding    Allergy    Anemia    Anterior chest wall pain    Appendicitis 1965   Asthma    Body mass index 37.0-37.9, adult    Breast pain    Cataract    both eyes   CHF (congestive heart failure) ([place])    Chronic kidney disease    stage 5 - on dialysis   Cognitive change 04/20/2021   r/t cva 03/2821   Dehydration 2014   Deviated septum 1971   Diabetes mellitus    no meds   Dyspnea 2014   Extrinsic asthma    WITH ASTHMA ATTACK   Fibroid 1980   GERD (gastroesophageal reflux disease)    Heart murmur    Hx gestational diabetes    Hyperlipidemia    Hypertension 2014   Inguinal hernia 1959   Malaise and fatigue 2014   Non-IgE mediated allergic asthma 2014   Obesity    Pelvic pain    Pregnancy, high-risk 1985   Stroke ([place]) 04/20/2021   (CVA) of right basal ganglia   Tonsillitis 1968   Uterine fibroid 1980   Past Surgical History:  Past Surgical History:  Procedure Laterality Date   APPENDECTOMY  3846   [place] Right 04/30/2021   Procedure: RIGHT FIRST STAGE [place];  Surgeon: Angelia Mould, MD;  Location: [facility] OR;  Service: Vascular;  Laterality: Right;   CESAREAN SECTION  1985   COLONOSCOPY     ESOPHAGOGASTRODUODENOSCOPY (EGD) WITH PROPOFOL N/A 04/18/2021   Procedure: ESOPHAGOGASTRODUODENOSCOPY (EGD) WITH PROPOFOL;  Surgeon: Gatha Mayer, MD;  [facility]: [facility]  ENDOSCOPY;  Service: Endoscopy;  Laterality: N/A;   EYE SURGERY     bilateral cataract    FLEXIBLE SIGMOIDOSCOPY N/A 04/18/2021   Procedure: FLEXIBLE SIGMOIDOSCOPY;  Surgeon: Gatha Mayer, MD;  Location: [facility] ENDOSCOPY;  Service: Endoscopy;  Laterality: N/A;   HERNIA REPAIR  1959   IR FLUORO GUIDE CV LINE RIGHT  03/18/2021   IR US GUIDE VASC ACCESS RIGHT  03/18/2021   LEFT HEART CATH AND CORONARY ANGIOGRAPHY N/A 04/04/2017   Procedure: Left Heart Cath and Coronary Angiography;  Surgeon: Belva Crome, MD;  Location: [facility] CV LAB;  Service: Cardiovascular;  Laterality: N/A;   MYOMECTOMY  1980, 2004, 2007   RHINOPLASTY  1971   ROTATOR CUFF REPAIR  2003   SURGICAL REPAIR OF HEMORRHAGE  2015   TONSILLECTOMY  1968    Assessment / Plan / Recommendation Clinical Impression Patient is a 70 y.o. year old female with PMH of ESRD on HD MWF, lower GIB, DM-2 HTN, HLD, diastolic CHF and recent hospitalization from 6/24-6/28 for acute CVA with left-sided weakness and ABLA due to lower GIB returning from CIR earlier this morning with "left-sided weakness and altered mental status.  The plan was to start aspirin in about a week in the setting of GI bleed.   Apparently, rapid response called due to confusion and restlessness after dialysis on 7/13.  Code stroke activated.  Neurology evaluated patient and recommended admission to medical floor and continuing aspirin, Plavix, Crestor and SBP > 120 out of concern for acute watershed CVA.  Patient had CT head, CTA head and neck without acute finding.  CT perfusion study failed due to technical difficulty.  Patient transferred to CIR on 05/07/2021 . SLP evaluation was completed on 05/09/2021 with results as follows :  Bedside Swallow Evaluation: Pt presents with mild left sided oral motor weakness which leads to prolonged mastication of solids.  Pt was able to clear solids from the oral cavity with increased time.  No overt s/s of aspiration were evident with  solids or liquids with min cues for use of swallowing precautions.  Recommend that pt continue on her currently prescribed diet of dys 3 textures and thin liquids with full supervision due to cognitive and motor deficits.   Cognitive-linguistic Evaluation:  Upon arrival, pt was somnolent and required more than a reasonable amount of time, environmental modifications, and multimodal stimulation to awaken.  Once awakened pt presented with a mild dysarthria resulting from the abovementioned oral motor deficits.  Pt was intelligible in conversations but she reports that she still struggles to maintain intelligibility during longer periods of communication and with increased fatigue.   Her levels of alertness throughout today's evaluation precluded formal cognitive assessment.  Informally, she presents with decreased recall of daily information, decreased sustained attention to tasks, and decreased functional problem solving.  As a result, pt requires mod assist to complete tasks at this time.    Pt would benefit from skilled ST while inpatient in order to maximize functional independence and reduce burden of care prior to discharge.  Anticipate that pt will need 24/7 supervision at discharge in addition to [place] follow up at next level of care.    Cognitive-linguistic evaluation   Skilled Therapeutic Interventions          Cognitive-linguistic and bedside swallow evaluation completed with results and recommendations reviewed with family.    SLP Assessment  Patient will need skilled Speech Lanaguage Pathology Services during CIR admission    Recommendations  SLP Diet Recommendations: Dysphagia 3 (Mech soft);Thin Liquid Administration via: Cup;Straw Medication Administration: Crushed with puree Supervision: Patient able to self feed;Full supervision/cueing for compensatory strategies Compensations: Slow rate;Small sips/bites Postural Changes and/or Swallow Maneuvers: Seated upright 90 degrees Oral Care  Recommendations: Oral care BID Recommendations for Other Services: Neuropsych consult Patient destination: Home Follow up Recommendations: 24 hour supervision/assistance;Home Health SLP Equipment Recommended: None recommended by SLP    SLP Frequency 3 to 5 out of 7 days   SLP Duration  SLP Intensity  SLP Treatment/Interventions 14-17 days  Minumum of 1-2 x/day, 30 to 90 minutes  Cognitive remediation/compensation;Dysphagia/aspiration precaution training;Internal/external aids;Speech/Language facilitation;Therapeutic Activities;Environmental controls;Cueing hierarchy;Functional tasks;Patient/family education    Pain Pain Assessment Pain Scale: 0-10 Pain Score: 0-No pain Pain Location: Generalized Pain Intervention(s): Medication (See eMAR)  Prior Functioning Cognitive/Linguistic Baseline: Within functional limits Type of Home: House  Lives With: Spouse Available Help at Discharge: Family;Available PRN/intermittently Education: graduate degree Vocation: Retired  SLP Evaluation Cognition Overall Cognitive Status: Impaired/Different from baseline Arousal/Alertness: Lethargic Orientation Level: Oriented X4 Attention: Sustained Sustained Attention: Impaired Sustained Attention Impairment: Verbal complex;Functional basic Memory: Impaired Memory Impairment: Retrieval deficit;Decreased recall of new information Awareness: Impaired Awareness Impairment: Emergent impairment Problem Solving: Impaired Problem Solving Impairment: Functional complex Safety/Judgment: Impaired  Comprehension Auditory Comprehension Overall Auditory Comprehension: Appears within functional limits for tasks assessed Expression Expression Primary Mode of Expression: Verbal  Verbal Expression Overall Verbal Expression: Appears within functional limits for tasks assessed Oral Motor Oral Motor/Sensory Function Overall Oral Motor/Sensory Function: Mild impairment Facial ROM: Reduced left Facial  Symmetry: Abnormal symmetry left;Suspected CN VII (facial) dysfunction Facial Strength: Suspected CN VII (facial) dysfunction;Reduced left Facial Sensation: Within Functional Limits Lingual ROM: Suspected CN XII (hypoglossal) dysfunction;Reduced left Lingual Symmetry: Abnormal symmetry left;Suspected CN XII (hypoglossal) dysfunction Lingual Strength: Reduced;Suspected CN XII (hypoglossal) dysfunction Velum: Impaired left;Suspected CN X (Vagus) dysfunction Mandible: Within Functional Limits Motor Speech Overall Motor Speech: Impaired Respiration: Within functional limits Phonation: Normal Resonance: Within functional limits Articulation: Impaired Level of Impairment: Conversation Intelligibility: Intelligible Motor Planning: Witnin functional limits  Care Tool Care Tool Cognition Expression of Ideas and Wants Expression of Ideas and Wants: Some difficulty - exhibits some difficulty with expressing needs and ideas (e.g, some words or finishing thoughts) or speech is not clear   Understanding Verbal and Non-Verbal Content Understanding Verbal and Non-Verbal Content: Usually understands - understands most conversations, but misses some part/intent of message. Requires cues at times to understand   Memory/Recall Ability *first 3 days only Memory/Recall Ability *first 3 days only: Current season;That he or she is in a hospital/hospital unit     PMSV Assessment  PMSV Trial Intelligibility: Intelligible  Bedside Swallowing Assessment General Date of Onset: 03/27/21 Previous Swallow Assessment: 05/06/2021, BSE Diet Prior to this Study: Dysphagia 3 (soft);Thin liquids Temperature Spikes Noted: No Respiratory Status: Room air History of Recent Intubation: No Behavior/Cognition: Lethargic/Drowsy;Requires cueing Oral Cavity - Dentition: Adequate natural dentition Self-Feeding Abilities: Needs assist Vision: Functional for self-feeding Baseline Vocal Quality: Normal  Oral Care  Assessment   Ice Chips   Thin Liquid Thin Liquid: Within functional limits Nectar Thick   Honey Thick   Puree Puree: Within functional limits Solid Solid: Impaired Oral Phase Impairments: Reduced lingual movement/coordination;Impaired mastication Oral Phase Functional Implications: Prolonged oral transit BSE Assessment Risk for Aspiration Impact on safety and function: Mild aspiration risk Other Related Risk Factors: History of GERD;Deconditioning;Lethargy;Cognitive impairment  Short Term Goals: Week 1: SLP Short Term Goal 1 (Week 1): Pt will utilize overarticulation to achieve intelligibility at the conversational level in mildly distracting environments with supervision verbal cues. SLP Short Term Goal 2 (Week 1): Pt will consume therapeutic trials of regular textures with supervision cues for use of swallowing precautions over 3 consecutive sessions prior to diet advancement. SLP Short Term Goal 3 (Week 1): Pt will use external aids to recall daily information with mod assist verbal cues. SLP Short Term Goal 4 (Week 1): Pt will complete basic tasks with mod assist verbal cues for functional problem solving. SLP Short Term Goal 5 (Week 1): Pt will sustain her attention to functional tasks for 10 minute intervals with min assist verbal cues for redirection.  Refer to Care Plan for Long Term Goals  Recommendations for other services: None   Discharge Criteria: Patient will be discharged from SLP if patient refuses treatment 3 consecutive times without medical reason, if treatment goals not met, if there is a change in medical status, if patient makes no progress towards goals or if patient is discharged from hospital.  The above assessment, treatment plan, treatment alternatives and goals were discussed and mutually agreed upon: by patient and by family  Emilio Math 05/09/2021, 12:44 PM

## 2021-05-09 NOTE — Discharge Instructions (Addendum)
Inpatient Rehab Discharge Instructions  Carolyn Russo Discharge date and time: 05/26/21   Activities/Precautions/ Functional Status: Activity: As tolerated Diet: Mechanical soft--diabetic/renal diet. Limit fluids to 1200cc/day Wound Care: Routine skin checks  Functional status:  ___ No restrictions     ___ Walk up steps independently _X__ 24/7 supervision/assistance   ___ Walk up steps with assistance ___ Intermittent supervision/assistance  ___ Bathe/dress independently ___ Walk with walker     _x__ Bathe/dress with assistance ___ Walk Independently    ___ Shower independently _X__ Walk with assistance    ___ Shower with assistance ___ No alcohol     ___ Return to work/school ________  COMMUNITY REFERRALS UPON DISCHARGE:    Home Health:   PT     OT     ST      SNA                    [place] *Please expect follow-up within 2-3 days to schedule your home visit. If you do not receive follow-up, be sure to contact the site directly.*   Medical Equipment/Items Ordered:rolling walker and transport chair (to be shipped to the home)                                                 Agency/Supplier: [facility] [phone]  Special Instructions: No driving smoking or alcohol Monitor blood sugars before meals and at bedtime.   My questions have been answered and I understand these instructions. I will adhere to these goals and the provided educational materials after my discharge from the hospital.  Patient/Caregiver Signature _______________________________ Date __________  Clinician Signature _______________________________________ Date __________  Please bring this form and your medication list with you to all your follow-up doctor's appointments.

## 2021-05-09 NOTE — Progress Notes (Signed)
Pt is alert and oriented. Pt carries conversation. Pt arouses easily from sleep. No complications noted at this time.     05/09/21 1200  Charting Type  Charting Type Reassessment  Neurological  Neuro (WDL) WDL  Orientation Level Oriented X4  Cognition Appropriate at baseline  Speech Slurred/Dysarthria;Clear  Pupil Assessment  Yes  R Pupil Size (mm) 3  R Pupil Shape Round  R Pupil Reaction Brisk  L Pupil Size (mm) 3  L Pupil Shape Round  L Pupil Reaction Brisk  Additional Pupil Assessments No  Neurological  Level of Consciousness Alert

## 2021-05-09 NOTE — Progress Notes (Addendum)
PROGRESS NOTE     Subjective/Complaints:     Pt reports very sleepy- hard to wake up- didn't sleep last night- called husband at 5am- said she didn't sleep last night at all- staff corroborates.   Husband thinks pt has switched day/night sleep cycle- which I agree with .       ROS:  Impaired secondary to sedation.  Objective:       Physical Exam: Vital Signs    Vitals with BMI 05/09/2021 05/09/2021 05/08/2021  Height - - -  Weight - - -  BMI - - -  Systolic 700 174 -  Diastolic 77 41 -  Pulse 75 69 77          General: sleepy/sleeping- woke briefly but not long; supine in bed; NAD HENT: conjugate gaze; oropharynx moist CV: regular rate; no JVD Pulmonary: CTA B/L; no W/R/R- good air movement GI: soft, NT, ND, (+)BS Psychiatric: sleepy Neurological: sleeping- woke briefly  Musculoskeletal:        General: No swelling.    Cervical back: Normal range of motion.    Comments: Mild tenderness left shoulder with ER/IR today  Skin:    General: Skin is warm and dry. Neurological:    Comments: alert, follows commands.   Fair insight and awareness. Provided biographical information. Normal language, speech slightly slurred. Mild left central 7. LUE 2 to 2+/5 deltoid, 3/5 biceps, triceps and 3+/5 wrist and HI. LLE 3-4/5 prox to distal. RUE and RLE 4-5/5 prox to distal. No sensory deficits noted--motor/sensory exam stable today.     Assessment/Plan: 1. Functional deficits which require 3+ hours per day of interdisciplinary therapy in a comprehensive inpatient rehab setting. Physiatrist is providing close team supervision and 24 hour management of active medical problems listed below. Physiatrist and rehab team continue to assess barriers to discharge/monitor patient progress toward functional and medical goals   Care Tool:   Bathing   Body parts bathed by patient: Right arm, Left arm, Chest, Abdomen, Front perineal  area, Right upper leg, Left upper leg    Body parts bathed by helper: Right lower leg, Left lower leg, Buttocks    Bathing assist Assist Level: Minimal Assistance - Patient > 75%    Upper Body Dressing/Undressing Upper body dressing What is the patient wearing?: Pull over shirt   Upper body assist Assist Level: Minimal Assistance - Patient > 75%   Lower Body Dressing/Undressing Lower body dressing       What is the patient wearing?: Pants, Incontinence brief        Lower body assist Assist for lower body dressing: Minimal Assistance - Patient > 75%     Toileting Toileting   Toileting assist Assist for toileting: Moderate Assistance - Patient 50 - 74%    Transfers Chair/bed transfer   Transfers assist     Chair/bed transfer assist level: Minimal Assistance - Patient > 75%    Locomotion Ambulation     Ambulation assist       Assist level: Minimal Assistance - Patient > 75% Assistive device: Walker-rolling Max distance: 50'    Walk 10 feet activity     Assist     Assist level:  Minimal Assistance - Patient > 75% Assistive device: Walker-rolling    Walk 50 feet activity     Assist Walk 50 feet with 2 turns activity did not occur: Safety/medical concerns   Assist level: Minimal Assistance - Patient > 75% Assistive device: Walker-rolling      Walk 150 feet activity     Assist Walk 150 feet activity did not occur: Safety/medical concerns        Walk 10 feet on uneven surface activity     Assist Walk 10 feet on uneven surfaces activity did not occur: Safety/medical concerns         Wheelchair         Assist Will patient use wheelchair at discharge?:  (TBD) Type of Wheelchair: Manual   Wheelchair assist level: Dependent - Patient 0% Max wheelchair distance: 150'      Wheelchair 50 feet with 2 turns activity       Assist           Assist Level: Dependent - Patient 0%    Wheelchair 150 feet activity       Assist        Assist Level: Dependent - Patient 0%     Medical Problem List and Plan: 1.  Left side weakness secondary to nonhemorrhagic right basal ganglia infarction             -patient may shower             con't OT, OT and SLP- will add Ritalin for lethargy and initiation- however due to BP being on high side, will do 2.5 mg with breakfast and lunch.  2.  Impaired mobility: -DVT/anticoagulation:  continue SCD             -antiplatelet therapy:ASA             Plavix restarted. 3. Right hip bursitis: continue Tramadol 50 mg every 12 hours as needed, ice, ROM, lidocaine patch 4. Anxiety: continue Xanax 0.5 mg 3 times daily as needed.  Provide emotional support             7/12- per husband, concerned about flat affect- will d/w possible depression with pt- if so, will start SSRI.              7/13- will wait for now per husband  7/16- will d/w husband when sees next?  7/17- adding ritalin for initiation/sedation during day.              -antipsychotic agents: N/A 5. Neuropsych: This patient is ?capable of making decisions on her own behalf. 6. Skin/Wound Care: Routine skin checks 7. Fluids/Electrolytes/Nutrition: Routine in and outs with follow-up chemistries 8.  Acute lower GI bleed with blood loss anemia.  Follow-up GI services.  Resume ASA as above. - Continue Protonix 40 mg twice daily 7/6: hgb 9.6--monitor for gross bleeding 7/11- last Hb stable at 9.8- will recheck this week             7/12- Hb came back at 9.7- stable- con't regimen             7/13- Hb up to 11.1- will con't to monitor  7/17- Hb 10.6- con't regimen 9.  Chronic diastolic congestive heart failure.  Monitor for any signs of fluid overload.  Continue Lasix 40-80 mg Monday Wednesday Friday             -daily weights, have been stable  7/12- weight bounced up 1 kg today, however was this level 2-3 days ago, so think yesterday's weight might have been slightly off- will monitor  7/16- weight much better- con't to  monitor 10.  Newly Dx'd End-stage renal disease.  Hemodialysis as per renal services             7/12- questionable steal syndrome since Fistula placement- will monitor per Vascular- to see if  needs additional intervention 11.  Hypertension.  Increase Coreg to 12.5 mg twice daily. Aggressive filtration with ultradialysis tomorrow             7/11- HD today- BP 170s/90s this AM- per renal.             7/12- BP dropped to 70s/50s with HD yesterday- back up to 150s/80s today- con't reigmen  7/17- BP 150s/160s- per renal- will only start 2.5 mg of Ritalin due to BP issues- will monitor closely.  12.  Asthma.  Continue inhalers as directed             - was getting Home O2 prior to admission- will likely need to go home on 2L O2-   Was trying to get at home, but hadn't yet.             7/1- con't nebs prn, home O2 ?PRN only             7/3 lungs very clear today, sats 99% on RA. Some of SOB may be behaviorally related. Reassured pt today that lungs sounded fine.                         -encouraged FV/IS, OOB             7/7: ordered CXT-stable, ordered incentive spirometer 13.Hyperlipidemia.Crestor 14.Obesity.BMI 34.31.Dietery follow up 15.  Diabetes mellitus.hemoglobin AIc 5.2.  CBGs 211-310. Increase Lantus to 13U BID.  7/17- BG's don't look as good- >200- will follow trend before making change yet.   16. Leukocytosis: trending down, repeat Monday             7/11- last WBC was 13k- will recheck in HD today.              7/13- WBC down to 10.1- will monitor  7/16- Last WBC 12.1- was 12.8 last week- bounced down and went back up again?- con't to monitor 17. Constipation: KUB shows moderate stool burden. Increase senna-docusate to BID.              7/13- LBM yesterday- con't regimen  18. Fistula placement: successful, new fistula accessed without complications. 19. Left shoulder pain: ordered lidocaine patch and voltaren gel. 20. Lethargy             7/12- is much improved today- will con't to  monitor- CT and CXR of chest were stable             7/16- will readd Melatonin for sleep- never had the chance to use it.   7/17- didn't sleep at all- hopefully Ritalin due day 2.5 mg BID- will help- cannot use Amantadine secondary to HD.   I spent a total of 35 minutes on visit- >50% on coordination of car-e speaking to husband about level of function/ritalin/BP.

## 2021-05-10 LAB — GLUCOSE, CAPILLARY
Glucose-Capillary: 197 mg/dL — ABNORMAL HIGH (ref 70–99)
Glucose-Capillary: 228 mg/dL — ABNORMAL HIGH (ref 70–99)
Glucose-Capillary: 139 mg/dL — ABNORMAL HIGH (ref 70–99)
Glucose-Capillary: 193 mg/dL — ABNORMAL HIGH (ref 70–99)

## 2021-05-10 LAB — HEPATITIS B SURFACE ANTIGEN: Hepatitis B Surface Ag: NONREACTIVE

## 2021-05-10 MED ORDER — PROSOURCE PLUS PO LIQD
30.0000 mL | Freq: Two times a day (BID) | ORAL | Status: DC
  Administered 2021-05-10 – 2021-05-27 (×19): 30 mL via ORAL
  Filled 2021-05-10 (×21): qty 30

## 2021-05-10 MED ORDER — HEPARIN SODIUM (PORCINE) 1000 UNIT/ML IJ SOLN
INTRAMUSCULAR | Status: AC
  Administered 2021-05-10: 1000 [IU] via INTRAVENOUS_CENTRAL
  Filled 2021-05-10: qty 4

## 2021-05-10 NOTE — Care Management (Signed)
[facility] Individual Statement of Services  Patient Name:  Carolyn Russo  Date:  05/10/2021  Welcome to the [facility].  Our goal is to provide you with an individualized program based on your diagnosis and situation, designed to meet your specific needs.  With this comprehensive rehabilitation program, you will be expected to participate in at least 3 hours of rehabilitation therapies Monday-Friday, with modified therapy programming on the weekends.  Your rehabilitation program will include the following services:  Physical Therapy (PT), Occupational Therapy (OT), Speech Therapy (ST), 24 hour per day rehabilitation nursing, Therapeutic Recreaction (TR), Psychology, Neuropsychology, Care Coordinator, Rehabilitation Medicine, [place], and Other  Weekly team conferences will be held on Tuesdays to discuss your progress.  Your Inpatient Rehabilitation Care Coordinator will talk with you frequently to get your input and to update you on team discussions.  Team conferences with you and your family in attendance may also be held.  Expected length of stay: 2-2.5 weeks    Overall anticipated outcome: Supervision  Depending on your progress and recovery, your program may change. Your Inpatient Rehabilitation Care Coordinator will coordinate services and will keep you informed of any changes. Your Inpatient Rehabilitation Care Coordinator's name and contact numbers are listed  below.  The following services may also be recommended but are not provided by the [facility] will be made to provide these services after discharge if needed.  Arrangements include referral to agencies that provide these services.  Your insurance has been verified to be:  Medicare A/B  Your primary  doctor is:  [name]  Pertinent information will be shared with your doctor and your insurance company.  Inpatient Rehabilitation Care Coordinator:  Cathleen Corti [phone] or [phone]  Information discussed with and copy given to patient by: Rana Snare, 05/10/2021, 11:48 AM

## 2021-05-10 NOTE — Progress Notes (Signed)
PROGRESS NOTE     Subjective/Complaints:  Pt speaking with SW  Pt with poor attention but is alert and oriented x 3     ROS: Denies CP, breathing issues, N/V/D  Objective:       Physical Exam: Vital Signs    Vitals with BMI 05/10/2021 05/09/2021 05/09/2021  Height - - -  Weight - - -  BMI - - -  Systolic 179 150 569  Diastolic 66 67 88  Pulse 72 78 75            General: No acute distress Mood and affect are appropriate Heart: Regular rate and rhythm no rubs murmurs or extra sounds Lungs: Clear to auscultation, breathing unlabored, no rales or wheezes Abdomen: Positive bowel sounds, soft nontender to palpation, nondistended Extremities: No clubbing, cyanosis, or edema Skin: No evidence of breakdown, no evidence of rash    Musculoskeletal:        General: No swelling.    Cervical back: Normal range of motion.    Comments: Mild tenderness left shoulder with ER/IR today  Skin:    General: Skin is warm and dry. Neurological:    Comments: alert, follows commands.   Fair insight and awareness. Provided biographical information. Normal language, speech slightly slurred. Mild left central 7. LUE 2 to 2+/5 deltoid, 3/5 biceps, triceps and 3+/5 wrist and HI. LLE 3-4/5 prox to distal. RUE and RLE 4-5/5 prox to distal. No sensory deficits noted--motor/sensory exam stable today.     Assessment/Plan: 1. Functional deficits which require 3+ hours per day of interdisciplinary therapy in a comprehensive inpatient rehab setting. Physiatrist is providing close team supervision and 24 hour management of active medical problems listed below. Physiatrist and rehab team continue to assess barriers to discharge/monitor patient progress toward functional and medical goals   Care Tool:   Bathing   Body parts bathed by patient: Right arm, Left arm, Chest, Abdomen, Front perineal area, Right upper leg, Left upper leg    Body  parts bathed by helper: Right lower leg, Left lower leg, Buttocks    Bathing assist Assist Level: Minimal Assistance - Patient > 75%    Upper Body Dressing/Undressing Upper body dressing What is the patient wearing?: Pull over shirt   Upper body assist Assist Level: Minimal Assistance - Patient > 75%   Lower Body Dressing/Undressing Lower body dressing       What is the patient wearing?: Pants, Incontinence brief        Lower body assist Assist for lower body dressing: Minimal Assistance - Patient > 75%     Toileting Toileting   Toileting assist Assist for toileting: Moderate Assistance - Patient 50 - 74%    Transfers Chair/bed transfer   Transfers assist     Chair/bed transfer assist level: Minimal Assistance - Patient > 75%    Locomotion Ambulation     Ambulation assist       Assist level: Minimal Assistance - Patient > 75% Assistive device: Walker-rolling Max distance: 50'    Walk 10 feet activity     Assist     Assist level: Minimal Assistance - Patient > 75% Assistive device: Walker-rolling  Walk 50 feet activity     Assist Walk 50 feet with 2 turns activity did not occur: Safety/medical concerns   Assist level: Minimal Assistance - Patient > 75% Assistive device: Walker-rolling      Walk 150 feet activity     Assist Walk 150 feet activity did not occur: Safety/medical concerns        Walk 10 feet on uneven surface activity     Assist Walk 10 feet on uneven surfaces activity did not occur: Safety/medical concerns         Wheelchair         Assist Will patient use wheelchair at discharge?:  (TBD) Type of Wheelchair: Manual   Wheelchair assist level: Dependent - Patient 0% Max wheelchair distance: 150'      Wheelchair 50 feet with 2 turns activity       Assist           Assist Level: Dependent - Patient 0%    Wheelchair 150 feet activity       Assist       Assist Level: Dependent - Patient 0%     Medical  Problem List and Plan: 1.  Left side weakness secondary to nonhemorrhagic right basal ganglia infarction             -patient may shower Reviewed noted 2 day interrupted stay 7/13--7/15 for altered MS , no new CVA identified             con't OT, OT and SLP- will add Ritalin for lethargy and initiation- however due to BP being on high side, will do 2.5 mg with breakfast and lunch.  2.  Impaired mobility: -DVT/anticoagulation:  continue SCD             -antiplatelet therapy:ASA             Plavix restarted. 3. Right hip bursitis: continue Tramadol 50 mg every 12 hours as needed, ice, ROM, lidocaine patch 4. Anxiety: continue Xanax 0.5 mg 3 times daily as needed.  Provide emotional support             7/12- per husband, concerned about flat affect- will d/w possible depression with pt- if so, will start SSRI.              7/13- will wait for now per husband  7/16- will d/w husband when sees next?  7/17- adding ritalin for initiation/sedation during day.              -antipsychotic agents: N/A 5. Neuropsych: This patient is ?capable of making decisions on her own behalf. 6. Skin/Wound Care: Routine skin checks 7. Fluids/Electrolytes/Nutrition: Routine in and outs with follow-up chemistries 8.  Acute lower GI bleed with blood loss anemia.  Follow-up GI services.  Resume ASA as above. - Continue Protonix 40 mg twice daily 7/6: hgb 9.6--monitor for gross bleeding 7/11- last Hb stable at 9.8- will recheck this week             7/12- Hb came back at 9.7- stable- con't regimen             7/13- Hb up to 11.1- will con't to monitor  7/17- Hb 10.6- con't regimen On Aranesp q Friday for anemia or chronic disease due to ESRD 9.  Chronic diastolic congestive heart failure.  Monitor for any signs of fluid overload.  Continue Lasix 40-80 mg Monday Wednesday Friday             -  daily weights, have been stable             7/12- weight bounced up 1 kg today, however was this level 2-3 days ago, so think  yesterday's weight might have been slightly off- will monitor  7/16- weight much better- con't to monitor 10.  Newly Dx'd End-stage renal disease.  Hemodialysis as per renal services             7/12- questionable steal syndrome since Fistula placement- will monitor per Vascular- to see if  needs additional intervention 11.  Hypertension.  Increase Coreg to 12.5 mg twice daily. Aggressive filtration with ultradialysis tomorrow             7/11- HD today- BP 170s/90s this AM- per renal.             7/12- BP dropped to 70s/50s with HD yesterday- back up to 150s/80s today- con't reigmen  7/17- BP 150s/160s- per renal- will only start 2.5 mg of Ritalin due to BP issues- will monitor closely.  Vitals:   05/09/21 2014 05/10/21 0636  BP:  (!) 165/66  Pulse:  72  Resp:  20  Temp:  97.9 F (36.6 C)  SpO2: 97% 100%   Per nephro, HD today 12.  Asthma.  Continue inhalers as directed             - was getting Home O2 prior to admission- will likely need to go home on 2L O2-   Was trying to get at home, but hadn't yet.             7/1- con't nebs prn, home O2 ?PRN only             7/3 lungs very clear today, sats 99% on RA. Some of SOB may be behaviorally related. Reassured pt today that lungs sounded fine.                         -encouraged FV/IS, OOB             7/7: ordered CXT-stable, ordered incentive spirometer 13.Hyperlipidemia.Crestor 14.Obesity.BMI 34.31.Dietery follow up 15.  Diabetes mellitus.hemoglobin AIc 5.2.  CBGs 211-310. Increase Lantus to 13U BID.  7/17- BG's don't look as good- >200- will follow trend before making change yet.   16. Leukocytosis: trending down, repeat Monday             7/11- last WBC was 13k- will recheck in HD today.              7/13- WBC down to 10.1- will monitor  7/16- Last WBC 12.1- was 12.8 last week- bounced down and went back up again?- con't to monitor, labs in HD 17. Constipation: KUB shows moderate stool burden. Increase senna-docusate to BID.               7/13- LBM yesterday- con't regimen  18. Fistula placement: successful, new fistula accessed without complications. 19. Left shoulder pain: ordered lidocaine patch and voltaren gel. 20. Lethargy             7/12- is much improved today- will con't to monitor- CT and CXR of chest were stable             7/16- will readd Melatonin for sleep- never had the chance to use it.   7/17- didn't sleep at all- hopefully Ritalin due day 2.5 mg BID- will help- cannot use Amantadine secondary to HD.   I  spent a total of 35 minutes on visit- >50% on coordination of car-e speaking to husband about level of function/ritalin/BP.

## 2021-05-10 NOTE — Progress Notes (Addendum)
[facility] Progress Note   Subjective:  Seen in room - in recliner. Denies pain, dyspnea. Flat affect today. For HD later today.  Objective Vitals:   05/09/21 1759 05/09/21 1934 05/09/21 2014 05/10/21 0636  BP: (!) 153/88 (!) 159/67  (!) 165/66  Pulse: 75 78  72  Resp:  19  20  Temp:  98.5 F (36.9 C)  97.9 F (36.6 C)  TempSrc:  Oral  Oral  SpO2:  97% 97% 100%  Weight:      Height:       Physical Exam General: Well appearing woman, NAD Heart: RRR; no murmur Lungs: CTAB Abdomen: soft Extremities: No LE edema Dialysis Access: TDC, RUE AVF + bruit  Additional Objective Labs: Basic Metabolic Panel: Recent Labs  Lab 05/03/21 1044 05/05/21 1131 05/07/21 0729  NA 131* 132* 135  K 4.8 4.2 4.2  CL 97* 94* 99  CO2 25 30 27   GLUCOSE 306* 123* 156*  BUN 21 14 22   CREATININE 5.90* 5.78* 6.99*  CALCIUM 8.6* 9.5 8.8*  PHOS 4.8* 5.1* 4.8*   Liver Function Tests: Recent Labs  Lab 05/03/21 1044 05/05/21 1131 05/07/21 0729  ALBUMIN 2.8* 3.3* 3.0*   CBC: Recent Labs  Lab 05/03/21 1044 05/05/21 1131 05/06/21 0456 05/07/21 0729  WBC 12.8* 10.1 8.8 12.1*  NEUTROABS 7.9*  --   --   --   HGB 9.7* 11.1* 10.6* 10.6*  HCT 29.5* 33.2* 31.0* 31.2*  MCV 88.6 87.4 88.6 87.4  PLT 299 391 235 370   Medications:   aspirin EC  81 mg Oral Daily   B-complex with vitamin C  1 tablet Oral Daily   carvedilol  3.125 mg Oral BID WC   Chlorhexidine Gluconate Cloth  6 each Topical BID   cholecalciferol  2,000 Units Oral Daily   clopidogrel  75 mg Oral Daily   [START ON 05/14/2021] darbepoetin (ARANESP) injection - DIALYSIS  100 mcg Intravenous Q Fri-HD   heparin  5,000 Units Subcutaneous Q8H   insulin aspart  0-6 Units Subcutaneous TID WC   insulin glargine  5 Units Subcutaneous Daily   levETIRAcetam  500 mg Oral QHS   melatonin  3 mg Oral QHS   methylphenidate  2.5 mg Oral BID WC   mometasone-formoterol  2 puff Inhalation BID   pantoprazole  40 mg Oral Daily    rosuvastatin  20 mg Oral Daily   senna-docusate  2 tablet Oral QHS   sevelamer carbonate  800 mg Oral TID WC    Dialysis Orders: MWF @ [facility] 4 hrs 180NRe 400/Autoflow 1.5 86.5 kg 2.0K/2.0 Ca TDC, heparin 2000 units bolus - Mircera 100 mcg IV q 2 weeks (last dose 04/14/2021 - Hectorol 5 mcg IV TIW  Assessment/Plan: 1. Acute R subcortical CVA: Back in CIR as of 7/15, transiently back to [facility] 7/14-7/15 with recurrent stroke like symptoms, likely watershed v. hypotensive issue per neuro. 2. Acute LGIB: Hx hemorrhoidal bleeding in past - s/p 2U PRBCs 6/26. S/p EGD/flex sig without clear bleeding source. Plavix and ASA held, now resumed. 3. ESRD: Continue HD per usual MWF schedule - HD today. No added heparin. 4. Dialysis Access: S/p 1st stage BVT 7/8 by Dr. Scot Dock - mild steal symptoms, monitoring for now. 5. HTN/volume: Variable BP - trying to avoid intradialytic hypotension, she is well below OP dry weight. Running even today. 6. Anemia: Hgb 10.6 - continue Aranesp 123mcg q Friday. 7. Secondary hyperparathyroidism: Hectoral on hold for low PTH x 2. Coralee Pesa  changed from Renvela pills to powder d/t issues swallowing pills. Ca/Phos ok for now. 8. Nutrition: Alb low, adding supplements. 9. T2DM: Per primary. 10. Insomnia: Per notes, has been await at night and sleeping during day - primary trialing ritalin during day and melatonin at night. Watch BP with stimulants.  Veneta Penton, PA-C 05/10/2021, 9:24 AM  [facility]  I have seen and examined this patient and agree with plan and assessment in the above note with renal recommendations/intervention highlighted.  Will need to be careful to prevent significant drops in BP during dialysis sessions.  Broadus John A Raenell Mensing,MD 05/10/2021 11:30 AM

## 2021-05-10 NOTE — Progress Notes (Signed)
Occupational Therapy Session Note  Patient Details  Name: Carolyn Russo MRN: [id number] Date of Birth: [birth year]  Today's Date: 05/10/2021 OT Individual Time: 1130-1155 OT Individual Time Calculation (min): 25 min    Short Term Goals: Week 1:  OT Short Term Goal 1 (Week 1): Pt will complete 2/3 steps of toileting with mod A. OT Short Term Goal 2 (Week 1): Pt will complete standing ADL with min A. OT Short Term Goal 3 (Week 1): Pt will don shirt with MIN A OT Short Term Goal 4 (Week 1): Pt will recall hemi dressing strategies with min VC  Skilled Therapeutic Interventions/Progress Updates:    Pt resting in w/c upon arrival. Pt required mod verbal cues for recall of morning therapies. Pt reports that her RUE is tingling which in improved from being numb. Pt also reports that her Lt shoulder hurts (see pain assessment.) Pt with limited AROM of L shoulder but PROM does cause increase in pain. Pt provided small foam cubes for [facility] GM tasks. Impaired RUE coordination when grasping cubes with particular difficulty grasping one cube individually. Pt remained in w/c with all needs within reach and belt alarm activated.   Therapy Documentation Precautions:  Precautions Precautions: Fall, Other (comment) Precaution Comments: L inattention, L hemiparesis, seizure protocol, impaired R UE coordination, R port a cath Restrictions Weight Bearing Restrictions: No  Pain:  Pt reports that her "joint hurts" when referring to her L shoulder; emotional support, passive ROM with no report of increase in pain    Therapy/Group: Individual Therapy  Leroy Libman 05/10/2021, 12:21 PM

## 2021-05-10 NOTE — Progress Notes (Signed)
Physical Therapy Session Note  Patient Details  Name: Carolyn Russo MRN: [id number] Date of Birth: 01-08-1951  Today's Date: 05/10/2021 PT Amount of Missed Time (min): 34 Minutes PT Missed Treatment Reason: Unavailable (Comment) (hemodialysis)  Short Term Goals: Week 1:  PT Short Term Goal 1 (Week 1): Pt will perform supine<>sit with min assist PT Short Term Goal 2 (Week 1): Pt will perform sit<>stands using LRAD with CGA PT Short Term Goal 3 (Week 1): Pt will perform bed<>chair transfers using LRAD with CGA PT Short Term Goal 4 (Week 1): Pt will ambulate at least 81ft using LRAD with min assist PT Short Term Goal 5 (Week 1): Pt will ascend/descend 4 steps using B HRs with min assist  Skilled Therapeutic Interventions/Progress Updates:    Attempted to see patient for scheduled therapy session. Patient off unit for HD, missed 75 min of scheduled skilled therapy services. Will follow up per POC.  Therapy Documentation Precautions:  Precautions Precautions: Fall, Other (comment) Precaution Comments: L inattention, L hemiparesis, seizure protocol, impaired R UE coordination, R port a cath Restrictions Weight Bearing Restrictions: No   Therapy/Group: Individual Therapy   Excell Seltzer, PT, DPT, CSRS  05/10/2021, 4:21 PM

## 2021-05-10 NOTE — IPOC Note (Signed)
Overall Plan of Care [facility]) Patient Details Name: Carolyn Russo MRN: [id number] DOB: Feb 09, 1951  Admitting Diagnosis: Basal ganglia infarction [facility])  Hospital Problems: Principal Problem:   Basal ganglia infarction [facility]) Active Problems:   Cerebrovascular accident (CVA) of right basal ganglia ([place])     Functional Problem List: Nursing Bowel, Endurance, Medication Management, Nutrition, Perception, Skin Integrity, Safety  PT Balance, Safety, Behavior, Sensory, Edema, Skin Integrity, Endurance, Motor, Nutrition, Pain, Perception  OT Balance, Motor, Sensory, Skin Integrity, Perception, Safety, Endurance, Cognition, Vision  SLP Cognition, Linguistic, Nutrition  TR         Basic ADL's: OT Eating, Grooming, Bathing, Dressing, Toileting     Advanced  ADL's: OT       Transfers: PT Bed Mobility, Bed to Chair, Car, [employer], Floor  OT Toilet     Locomotion: PT Ambulation, Stairs, Wheelchair Mobility     Additional Impairments: OT Fuctional Use of Upper Extremity  SLP Swallowing, Communication, Social Cognition expression Problem Solving, Memory, Attention, Awareness  TR      Anticipated Outcomes Item Anticipated Outcome  Self Feeding mod I  Swallowing  supervision   Basic self-care  min A  Toileting  S   Bathroom Transfers S  Bowel/Bladder  Mod I with bowels  Transfers  supervision  Locomotion  CGA with LRAD  Communication  supervision  Cognition  min assist  Pain  < 3  Safety/Judgment  Mod I assist and no falls   Therapy Plan: PT Intensity: Minimum of 1-2 x/day ,45 to 90 minutes PT Frequency: 5 out of 7 days PT Duration Estimated Length of Stay: ~2 weeks OT Intensity: Minimum of 1-2 x/day, 45 to 90 minutes OT Frequency: 5 out of 7 days OT Duration/Estimated Length of Stay: 2-2.5 weeks SLP Intensity: Minumum of 1-2 x/day, 30 to 90 minutes SLP Frequency: 3 to 5 out of 7 days SLP Duration/Estimated Length of Stay: 14-17 days   Due to the current  state of emergency, patients may not be receiving their 3-hours of Medicare-mandated therapy.   Team Interventions: Nursing Interventions Patient/Family Education, Bowel Management, Disease Management/Prevention, Medication Management, Skin Care/Wound Management, Discharge Planning, Psychosocial Support  PT interventions Ambulation/gait training, Training and development officer, Cognitive remediation/compensation, Community reintegration, Discharge planning, Disease management/prevention, DME/adaptive equipment instruction, Functional electrical stimulation, Functional mobility training, Neuromuscular re-education, Patient/family education, Psychosocial support, Splinting/orthotics, Stair training, Therapeutic Activities, Therapeutic Exercise, UE/LE Strength taining/ROM, UE/LE Coordination activities, Wheelchair propulsion/positioning, Pain management, Skin care/wound management, Visual/perceptual remediation/compensation  OT Interventions Balance/vestibular training, Discharge planning, Pain management, Self Care/advanced ADL retraining, Therapeutic Activities, UE/LE Coordination activities, Visual/perceptual remediation/compensation, Therapeutic Exercise, Skin care/wound managment, Patient/family education, Functional mobility training, Disease mangement/prevention, Cognitive remediation/compensation, Community reintegration, Engineer, drilling, Neuromuscular re-education, Psychosocial support, Splinting/orthotics, UE/LE Strength taining/ROM, Wheelchair propulsion/positioning, Functional electrical stimulation  SLP Interventions Cognitive remediation/compensation, Dysphagia/aspiration precaution training, Internal/external aids, Speech/Language facilitation, Therapeutic Activities, Environmental controls, Cueing hierarchy, Functional tasks, Patient/family education  TR Interventions    SW/CM Interventions     Barriers to Discharge MD  Medical stability  Nursing Decreased caregiver support,  Home environment access/layout, Incontinence, Lack of/limited family support, Wound Care, Hemodialysis, Medication compliance, Behavior, Nutrition means 2 level home, 4 steps to enter, bed/bath on main level.  PT Decreased caregiver support, Home environment access/layout, Incontinence, Lack of/limited family support, Hemodialysis    OT Inaccessible home environment, Decreased caregiver support    SLP      SW       Team Discharge Planning: Destination: PT-Home ,OT- Home , SLP-Home Projected Follow-up: PT-Home health  PT, 24 hour supervision/assistance, OT-  Home health OT, SLP-24 hour supervision/assistance, Home Health SLP Projected Equipment Needs: PT-To be determined, OT- To be determined, SLP-None recommended by SLP Equipment Details: PT- , OT-  Patient/family involved in discharge planning: PT- Patient,  OT-Patient, SLP-Patient, Family member/caregiver  MD ELOS: additional 7-10d Medical Rehab Prognosis:  Fair Assessment: 70 year old right-handed female with history significant for hypertension, obesity with BMI 34.31, hyperlipidemia, newly diagnosed end-stage renal disease on hemodialysis Monday Wednesday Friday, diabetes mellitus, CAD maintained on aspirin, iron deficiency anemia and asthma.  Patient with latest admission 03/09/2021 to 03/24/2021 for acute on chronic diastolic congestive heart failure exacerbation.  Per chart review patient lives with spouse.  Two-level home bed and bath main level 4 steps to entry.  Independent with cane prior to admission.  Presented 04/16/2021 with left-sided weakness x3 days and slurred speech with bouts of vomiting.  It was noted patient had initially presented to the ER couple days prior to the latest admission with complaints of left-sided weakness but left without being formally evaluated.  Cranial CT scan showed no acute abnormalities.  Patient did not receive tPA.  MRI of the brain showed a 2.1 cm acute ischemic nonhemorrhagic right basal ganglia  infarction.  MRA angiogram of head and neck grossly negative no large vessel occlusion or high-grade stenosis.  Most recent echocardiogram with ejection fraction of 42% grade 2 diastolic dysfunction.  Admission chemistries hemoglobin 8.8-9.0, chemistries unremarkable sent creatinine 3.58, hemoglobin A1c 5.2.  Hospital course gastroenterology Dr. Carlean Purl consulted 04/18/2021 after patient had multiple large bloody bowel movements with clots.  Underwent upper endoscopy 04/18/2021 showing normal esophagus and stomach and the cardia and gastric fundus normal on retroflexion and also underwent flexible sigmoidoscopy showing hemorrhoids found on perianal exam external/internal hemorrhoids.  Patient had initially been placed on aspirin and Plavix for CVA prophylaxis held due to suspected lower GI bleed awaiting plan on resuming anticoagulation.  Follow-up renal services with hemodialysis ongoing as directed.  Currently on a soft diet advanced as tolerated.  Due to patient decreased functional ability left side weakness was admitted for a comprehensive rehab program.  Stroke like event with transfer back to acute 7/14-15 no new infarct      See Team Conference Notes for weekly updates to the plan of care

## 2021-05-10 NOTE — Progress Notes (Addendum)
Occupational Therapy Session Note  Patient Details  Name: Carolyn Russo MRN: [id number] Date of Birth: 06/27/1951  Today's Date: 05/10/2021 OT Individual Time: 6578-4696 OT Individual Time Calculation (min): 62 min    Short Term Goals: Week 1:  OT Short Term Goal 1 (Week 1): Pt will complete 2/3 steps of toileting with mod A. OT Short Term Goal 2 (Week 1): Pt will complete standing ADL with min A. OT Short Term Goal 3 (Week 1): Pt will don shirt with MIN A OT Short Term Goal 4 (Week 1): Pt will recall hemi dressing strategies with min VC  Skilled Therapeutic Interventions/Progress Updates:  Pt received supine in bed finishing up breakfast, assisted pt with donning built up handles on utensils with improved accuracy with [facility] during self feeding, pt required hand over hand assist to bring cup to mouth and may benefit from cup with handles next session. Pt currently requires MIN A for bed mobility, MIN A for stand pivot transfer from EOB>w/c with HHA. Pt transported to BR for toileting with pt completing stand pivot transfer with same level of assist as previously indicated. MAX A for clothing mgmt and pericare. Pt transported to sink for bathing with pt needing MINA for bathing needing most assist to wash buttock in standing. Pt with noted decreased [place] with RUE needing assist to stabilize ADL items with R hand and use L hand for precision movements. Additionally noted some L inattention, when OTA transitioned to L side of pt during bathing, pt states "where did you go?" MOD A for UB Dressing with OH shirt and MAX A for LB dressing. Pt left seated in w/c with alarm belt activated and all needs within reach.   Therapy Documentation Precautions:  Precautions Precautions: Fall, Other (comment) Precaution Comments: L inattention, L hemiparesis, seizure protocol, impaired R UE coordination, R port a cath Restrictions Weight Bearing Restrictions: No General:   Vital Signs:  Pain: Pt reports no  pain during session.    Therapy/Group: Individual Therapy  Corinne Ports [facility] 05/10/2021, 12:05 PM

## 2021-05-10 NOTE — Progress Notes (Signed)
Speech Language Pathology Session Note   05/10/2021 1002-1100 58 minutes   Skilled Therapeutic Interventions/Progress note:  Skilled ST services focused on swallow and cognitive skills. SLP facilitated continued assessment of cognitive skills administering formal assessment SLUMS, pt scored 16 out 30 (n=>27) indicating severe deficits primarily due to reduced alertness/attention impacting short term recall, problem solving and error awareness. Pt required verbal cues initially in 2 minute intervals increasing to 30 second intervals to maintain alertness as well as a cold compress. Pt expressed poor performance was due to "not interested" in the task. SLP provided education pertaining to in ability to keep eyes open and during tasks, and options for next task. Pt selected card task and demonstrated ability to sort cards in a field of 4 by color with supervision A verbal cues for error awareness however required consistent cuing for poor awareness of left hand, disorganizing piles. Pt was able to maintain sustained attention in up to 5 minute interval but required verbal cues in 1-2 minutes intervals to maintain alertness. SLP provided regular textured trial snack in which pt demonstrated appropriate mastication, oral clearance, timely swallow and no overt s/s aspiration, however required mod A verbal cues to remain awake during task. SLP recommend to continue dys 3 textures and thin liquids until alertness improves. Pt was left in room with call bell within reach and chair alarm set. SLP recommends to continue skilled services.  Company secretary

## 2021-05-10 NOTE — Progress Notes (Signed)
Patient ID: Carolyn Russo, female   DOB: 1951-05-09, 70 y.o.   MRN: [id number]  SW spoke with Greg/Dialysis Coord [phone]) to inform on pt change in d/c date to 8/4. Will f/u if there any changes.   SW met with pt in room to provide updates from team conference, and d/c date 8/4. Pt aware SW to follow-up with her husband.  SW called [name] to provide updates above updates. Husband is working on home care agency for pt when she discharges. SW will continue to update on d/c needs.   Loralee Pacas, MSW, [place] Office: [phone] Cell: [phone] Fax: [phone]

## 2021-05-11 ENCOUNTER — Telehealth: Payer: Self-pay | Admitting: Physician Assistant

## 2021-05-11 ENCOUNTER — Other Ambulatory Visit: Payer: Self-pay

## 2021-05-11 ENCOUNTER — Encounter (HOSPITAL_COMMUNITY): Payer: Self-pay | Admitting: Physical Medicine and Rehabilitation

## 2021-05-11 DIAGNOSIS — I639 Cerebral infarction, unspecified: Secondary | ICD-10-CM | POA: Diagnosis not present

## 2021-05-11 LAB — GLUCOSE, CAPILLARY
Glucose-Capillary: 158 mg/dL — ABNORMAL HIGH (ref 70–99)
Glucose-Capillary: 162 mg/dL — ABNORMAL HIGH (ref 70–99)
Glucose-Capillary: 233 mg/dL — ABNORMAL HIGH (ref 70–99)
Glucose-Capillary: 249 mg/dL — ABNORMAL HIGH (ref 70–99)

## 2021-05-11 MED ORDER — SORBITOL 70 % SOLN
30.0000 mL | Freq: Once | Status: AC
  Administered 2021-05-11: 30 mL via ORAL
  Filled 2021-05-11: qty 30

## 2021-05-11 NOTE — Progress Notes (Signed)
Inpatient Rehabilitation Care Coordinator Assessment and Plan Patient Details  Name: Carolyn Russo MRN: [id number] Date of Birth: 05-Dec-1950  Today's Date: 05/11/2021  Hospital Problems: Principal Problem:   Basal ganglia infarction [facility]) Active Problems:   Cerebrovascular accident (CVA) of right basal ganglia ([place])  Past Medical History:  Past Medical History:  Diagnosis Date   Acute GI bleeding    Allergy    Anemia    Anterior chest wall pain    Appendicitis 1965   Asthma    Body mass index 37.0-37.9, adult    Breast pain    Cataract    both eyes   CHF (congestive heart failure) ([place])    Chronic kidney disease    stage 5 - on dialysis   Cognitive change 04/20/2021   r/t cva 03/2821   Dehydration 2014   Deviated septum 1971   Diabetes mellitus    no meds   Dyspnea 2014   Extrinsic asthma    WITH ASTHMA ATTACK   Fibroid 1980   GERD (gastroesophageal reflux disease)    Heart murmur    Hx gestational diabetes    Hyperlipidemia    Hypertension 2014   Inguinal hernia 1959   Malaise and fatigue 2014   Non-IgE mediated allergic asthma 2014   Obesity    Pelvic pain    Pregnancy, high-risk 1985   Stroke ([place]) 04/20/2021   (CVA) of right basal ganglia   Tonsillitis 1968   Uterine fibroid 1980   Past Surgical History:  Past Surgical History:  Procedure Laterality Date   APPENDECTOMY  2585   [place] Right 04/30/2021   Procedure: RIGHT FIRST STAGE [place];  Surgeon: Angelia Mould, MD;  Location: [facility] OR;  Service: Vascular;  Laterality: Right;   CESAREAN SECTION  1985   COLONOSCOPY     ESOPHAGOGASTRODUODENOSCOPY (EGD) WITH PROPOFOL N/A 04/18/2021   Procedure: ESOPHAGOGASTRODUODENOSCOPY (EGD) WITH PROPOFOL;  Surgeon: Gatha Mayer, MD;  Location: [facility] ENDOSCOPY;  Service: Endoscopy;  Laterality: N/A;   EYE SURGERY     bilateral cataract    FLEXIBLE SIGMOIDOSCOPY N/A 04/18/2021   Procedure: FLEXIBLE SIGMOIDOSCOPY;   Surgeon: Gatha Mayer, MD;  Location: [facility] ENDOSCOPY;  Service: Endoscopy;  Laterality: N/A;   HERNIA REPAIR  1959   IR FLUORO GUIDE CV LINE RIGHT  03/18/2021   IR US GUIDE VASC ACCESS RIGHT  03/18/2021   LEFT HEART CATH AND CORONARY ANGIOGRAPHY N/A 04/04/2017   Procedure: Left Heart Cath and Coronary Angiography;  Surgeon: Belva Crome, MD;  Location: [place] CV LAB;  Service: Cardiovascular;  Laterality: N/A;   MYOMECTOMY  1980, 2004, 2007   RHINOPLASTY  1971   ROTATOR CUFF REPAIR  2003   SURGICAL REPAIR OF HEMORRHAGE  2015   TONSILLECTOMY  1968   Social History:  reports that she has never smoked. She has never used smokeless tobacco. She reports that she does not drink alcohol and does not use drugs.  Family / Support Systems Marital Status: Married How Long?: 41 years Patient Roles: Spouse, Parent Spouse/Significant Other: Dr. Simkin (husband) [phone] Children: 1 adult son Other Supports: Husband intends to hire private aide Anticipated Caregiver: Husband Ability/Limitations of Caregiver: Husband works during the day and is working towards Haematologist Availability: 24/7 Family Dynamics: Pt lives with husband. Husband transports pt to/from dialysis.Adult son lives in [place].  Social History Preferred language: English Religion: Seventh Day Adventist Cultural Background: Pt served as Field seismologist;  she worked at [employer] throughout the [place]. Education: doctorate Read: Yes Write: Yes Employment Status: Retired Public relations account executive Issues: Denies Guardian/Conservator: N/A   Abuse/Neglect Abuse/Neglect Assessment Can Be Completed: Yes Physical Abuse: Denies Verbal Abuse: Denies Sexual Abuse: Denies Exploitation of patient/patient's resources: Denies Self-Neglect: Denies  Emotional Status Pt's affect, behavior and adjustment status: Pt in good spirits at time of visit. Pt was eating yogurt when SW came into  the room. Pt required some assistance with cleaning her hands. Recent Psychosocial Issues: Denies Psychiatric History: Denies Substance Abuse History: Denies  Patient / Family Perceptions, Expectations & Goals Pt/Family understanding of illness & functional limitations: Pt and husband has a general understanding of pt care needs. Premorbid pt/family roles/activities: Denies Anticipated changes in roles/activities/participation: Assistance with ADLs/IADLs Pt/family expectations/goals: Pt goal is to work on ADLs and speech articulation.  Community Resources Express Scripts: None Premorbid Home Care/DME Agencies: None Transportation available at discharge: Husband Resource referrals recommended: Neuropsychology  Discharge Planning Living Arrangements: Spouse/significant other Support Systems: Spouse/significant other, Other (Comment) (husband to hire Building services engineer) Type of Residence: Private residence Insurance underwriter Resources: [employer], Multimedia programmer (specify) Nurse, mental health) Financial Resources: Fish farm manager, Family Support Financial Screen Referred: No Living Expenses: Own Money Management: Patient, Spouse Does the patient have any problems obtaining your medications?: No Home Management: Pt reports she and husband both managed home care needs Patient/Family Preliminary Plans: Husband to hire private aides Care Coordinator Barriers to Discharge: Decreased caregiver support, Lack of/limited family support Care Coordinator Anticipated Follow Up Needs: HH/OP  Clinical Impression SW met with pt in room to introduce self, explain role, and discuss discharge process. Pt is not a English as a second language teacher. Pt has HCPOA. DME: cane and rollator.   Amber Guthridge A Barbra Sarks 05/11/2021, 2:44 PM

## 2021-05-11 NOTE — Patient Care Conference (Signed)
Inpatient RehabilitationTeam Conference and Plan of Care Update Date: 05/11/2021   Time: 11:12 AM    Patient Name: Carolyn Russo      Medical Record Number: [id number]  Date of Birth: July 11, 1951 Sex: Female         Room/Bed: 4W03C/4W03C-01 Payor Info: Payor: MEDICARE / Plan: MEDICARE PART A AND B / Product Type: *No Product type* /    Admit Date/Time:  05/07/2021  4:15 PM  Primary Diagnosis:  Basal ganglia infarction [facility])  Hospital Problems: Principal Problem:   Basal ganglia infarction [facility]) Active Problems:   Cerebrovascular accident (CVA) of right basal ganglia [facility])    Expected Discharge Date: Expected Discharge Date: 05/27/21  Team Members Present: Physician leading conference: Dr. Courtney Heys Care Coodinator Present: Loralee Pacas, LCSWA;Arlana Canizales Creig Hines, RN, BSN, CRRN Nurse Present: Dorthula Nettles, RN PT Present: Excell Seltzer, PT OT Present: Roanna Epley, [place], OT SLP Present: Charolett Bumpers, SLP PPS Coordinator present : Gunnar Fusi, SLP     Current Status/Progress Goal Weekly Team Focus  Bowel/Bladder   Pt is continent/incontinent of bowel and bladder  Pt will gain continence of bowel and bladder  Will assess qshift and PRN   Swallow/Nutrition/ Hydration   dys 3 textures and thin liquids, mod-min A due to alertness  Supervision A  swallow stratgies and regular trials   ADL's   bathing-min A: UB dressing-mod A: LB dressing-max A; toileting-max A: functional transfers-min A; mild Lt inattention  supervision overall, min A for bathing and toileting  activity tolerance, BADL retraining; functional transfers; safety awareness   Mobility   min to mod A transfers, gait x 30 ft RW mod A (L lateral lean), mod A stairs with 2 handrails  Supervision transfers and gait with LRAD; min A stairs  endurance, balance, gait, safety awareness   Communication   Mod-min A, reduced alertness  Supervision A  speech intelligibility   Safety/Cognition/ Behavioral  Observations  Max-Mod A, reduced alertness  Min A  increase alertness, recall, basic problem solving, error awareness   Pain   Pt is pain free  Pt will remain pain free  Will assess qshift and PRN   Skin   Pt skin is intact  Pt's skin will remain intact  Will assess qshift and PRN     Discharge Planning:  Pt to d/c to home with husband who intends to hire private aide during the day while he works.   Team Discussion: Started Ritalin on Sunday. Fatigues easily. Needs a bowel movement. Please add sleep chart to the door. Continent/incontinent of B/B, LBM 05/08/21. No pain reported. Patient on target to meet rehab goals: Min/mod assist with RW, transfers. Stairs mod assist. Bathing min assist. Dressing is more challenging. Lower body dressing max assist, toileting max assist. SLP reports alertness is problematic. SLUMS test 16/30. Patient reports speech therapist is boring.  *See Care Plan and progress notes for long and short-term goals.   Revisions to Treatment Plan:  Giving Sorbitol for constipation.   Teaching Needs: Family education, medication management, pain management, bowel/bladder management, skin/wound care, transfer training, gait training, balance training, endurance training, safety awareness.  Current Barriers to Discharge: Decreased caregiver support, Medical stability, Home enviroment access/layout, Incontinence, Wound care, Lack of/limited family support, Weight, Hemodialysis, Medication compliance, Behavior, and Nutritional means  Possible Resolutions to Barriers: Continue current medications, provide emotional support.     Medical Summary Current Status: LBM 2-3 days ago- continent/incontinent-  Barriers to Discharge: Incontinence;Behavior;Home enviroment access/layout;Decreased family/caregiver support;Medical stability;Hemodialysis;Wound care;Other (comments);Weight;Nutrition  means  Barriers to Discharge Comments: steal syndrome on RUE; HD M/W/F; husband found home  care agency Possible Resolutions to [employer] Focus: will give Sorbitol- needs EMG outpt; LUQ pain- constipation?;  added ritalin 2.5 mg BID- see if can increase; - more issues with staying awake/sequencing still; mild L inattention- - more alert yesterday; SLP- D3 thin; cannot keep awake for therapy- SLUMS 16/30? severe secondary to alertness- poor asleep- sleep chart ordered- d/c 8/4   Continued Need for Acute Rehabilitation Level of Care: The patient requires daily medical management by a physician with specialized training in physical medicine and rehabilitation for the following reasons: Direction of a multidisciplinary physical rehabilitation program to maximize functional independence : Yes Medical management of patient stability for increased activity during participation in an intensive rehabilitation regime.: Yes Analysis of laboratory values and/or radiology reports with any subsequent need for medication adjustment and/or medical intervention. : Yes   I attest that I was present, lead the team conference, and concur with the assessment and plan of the team.   Cristi Loron 05/11/2021, 5:48 PM

## 2021-05-11 NOTE — Telephone Encounter (Signed)
Pt currently in rehab in hospital.  Called husband and left message for him to call back.  Disability paperwork was received but Dr. Tamala Julian states it would be more appropriate for nephrology or neurology to complete as pt does not have a cardiac reason to file disability.  Paperwork is being returned to medical records.

## 2021-05-11 NOTE — Progress Notes (Signed)
PROGRESS NOTE     Subjective/Complaints:  Pt reports that she has a pain under L breast that comes and goes, but worse with deep breath. Said this pain feels like 'cutting her in half".  LBM 2+ days ago per pt/husband.   Also c/o R jaw pain that comes and goes. Like jaw grinding.  Per husband, was awake when he got here, and got through AM therapy/washing up, but then fatigued and now intermittently sleeping He's also still concerned about RUE being coolr and dexterity is off, but L side affected by stroke, not R- ever since had Fistula put in- Asking if needs EMG?    ROS:  Pt denies SOB, abd pain, CP, N/V/C/D, and vision changes  Objective:       Physical Exam: Vital Signs    Vitals with BMI 05/11/2021 05/10/2021 05/10/2021  Height - - -  Weight 183 lbs 3 oz - -  BMI 70.4 - -  Systolic [phone]  Diastolic 68 72 81  Pulse 75 79 80             General: awake, alert, appropriate, initially- sitting up in w/c- husband in room; nurse giving meds via applesauce; NAD HENT: conjugate gaze; oropharynx moist CV: regular rate; no JVD Pulmonary: CTA B/L; no W/R/R- good air movement GI: soft, NT, somewhat distended Psychiatric: appropriate; delayed responses Neurological: alert 1/2 of visit, then fell asleep sitting up and then groggy  Musculoskeletal: R hand/forearm cooler than L side- but not COLD-        General: No swelling.    Cervical back: Normal range of motion.    Comments: Mild tenderness left shoulder with ER/IR today  Skin:    General: Skin is warm and dry. Neurological:    Comments: alert, follows commands.   Fair insight and awareness. Provided biographical information. Normal language, speech slightly slurred. Mild left central 7. LUE 2 to 2+/5 deltoid, 3/5 biceps, triceps and 3+/5 wrist and HI. LLE 3-4/5 prox to distal. RUE and RLE 4-5/5 prox to distal. No sensory deficits noted--motor/sensory exam  stable today.  also cannot do finger touching to each finger/thumb- not able to do any dexterity movements, or hold pen.    Assessment/Plan: 1. Functional deficits which require 3+ hours per day of interdisciplinary therapy in a comprehensive inpatient rehab setting. Physiatrist is providing close team supervision and 24 hour management of active medical problems listed below. Physiatrist and rehab team continue to assess barriers to discharge/monitor patient progress toward functional and medical goals   Care Tool:   Bathing   Body parts bathed by patient: Right arm, Left arm, Chest, Abdomen, Front perineal area, Right upper leg, Left upper leg    Body parts bathed by helper: Right lower leg, Left lower leg, Buttocks    Bathing assist Assist Level: Minimal Assistance - Patient > 75%    Upper Body Dressing/Undressing Upper body dressing What is the patient wearing?: Pull over shirt   Upper body assist Assist Level: Minimal Assistance - Patient > 75%   Lower Body Dressing/Undressing Lower body dressing       What is the patient wearing?: Pants, Incontinence brief  Lower body assist Assist for lower body dressing: Minimal Assistance - Patient > 75%     Toileting Toileting   Toileting assist Assist for toileting: Moderate Assistance - Patient 50 - 74%    Transfers Chair/bed transfer   Transfers assist     Chair/bed transfer assist level: Minimal Assistance - Patient > 75%    Locomotion Ambulation     Ambulation assist       Assist level: Minimal Assistance - Patient > 75% Assistive device: Walker-rolling Max distance: 50'    Walk 10 feet activity     Assist     Assist level: Minimal Assistance - Patient > 75% Assistive device: Walker-rolling    Walk 50 feet activity     Assist Walk 50 feet with 2 turns activity did not occur: Safety/medical concerns   Assist level: Minimal Assistance - Patient > 75% Assistive device: Walker-rolling      Walk  150 feet activity     Assist Walk 150 feet activity did not occur: Safety/medical concerns        Walk 10 feet on uneven surface activity     Assist Walk 10 feet on uneven surfaces activity did not occur: Safety/medical concerns         Wheelchair         Assist Will patient use wheelchair at discharge?:  (TBD) Type of Wheelchair: Manual   Wheelchair assist level: Dependent - Patient 0% Max wheelchair distance: 150'      Wheelchair 50 feet with 2 turns activity       Assist           Assist Level: Dependent - Patient 0%    Wheelchair 150 feet activity       Assist       Assist Level: Dependent - Patient 0%     Medical Problem List and Plan: 1.  Left side weakness secondary to nonhemorrhagic right basal ganglia infarction             -patient may shower Reviewed noted 2 day interrupted stay 7/13--7/15 for altered MS , no new CVA identified             con't OT, OT and SLP- will add Ritalin for lethargy and initiation- however due to BP being on high side, will do 2.5 mg with breakfast and lunch. -con't PT, OT and SLP- BP doing well/stable on Ritalin.   2.  Impaired mobility: -DVT/anticoagulation:  continue SCD             -antiplatelet therapy:ASA             Plavix restarted. 3. Right hip bursitis: continue Tramadol 50 mg every 12 hours as needed, ice, ROM, lidocaine patch 4. Anxiety: continue Xanax 0.5 mg 3 times daily as needed.  Provide emotional support             7/12- per husband, concerned about flat affect- will d/w possible depression with pt- if so, will start SSRI.              7/13- will wait for now per husband  7/16- will d/w husband when sees next?  7/17- adding ritalin for initiation/sedation during day.   7/19- will increase Ritalin if OK with renal tomorrow and move AM dose to earlier.              -antipsychotic agents: N/A 5. Neuropsych: This patient is ?capable of making decisions on her own behalf. 6. Skin/Wound Care: Routine  skin checks 7. Fluids/Electrolytes/Nutrition: Routine in and outs with follow-up chemistries 8.  Acute lower GI bleed with blood loss anemia.  Follow-up GI services.  Resume ASA as above. - Continue Protonix 40 mg twice daily 7/6: hgb 9.6--monitor for gross bleeding 7/11- last Hb stable at 9.8- will recheck this week             7/12- Hb came back at 9.7- stable- con't regimen             7/13- Hb up to 11.1- will con't to monitor  7/17- Hb 10.6- con't regimen On Aranesp q Friday for anemia or chronic disease due to ESRD 9.  Chronic diastolic congestive heart failure.  Monitor for any signs of fluid overload.  Continue Lasix 40-80 mg Monday Wednesday Friday             -daily weights, have been stable             7/12- weight bounced up 1 kg today, however was this level 2-3 days ago, so think yesterday's weight might have been slightly off- will monitor  7/16- weight much better- con't to monitor 10.  Newly Dx'd End-stage renal disease.  Hemodialysis as per renal services             7/12- questionable steal syndrome since Fistula placement- will monitor per Vascular- to see if  needs additional intervention  7/19- R hand still cooler to touch- also has decreased dexterity- will likely need EMG after d/c.  11.  Hypertension.  Increase Coreg to 12.5 mg twice daily. Aggressive filtration with ultradialysis tomorrow             7/11- HD today- BP 170s/90s this AM- per renal.             7/12- BP dropped to 70s/50s with HD yesterday- back up to 150s/80s today- con't reigmen  7/17- BP 150s/160s- per renal- will only start 2.5 mg of Ritalin due to BP issues- will monitor closely.  Vitals:   05/11/21 0520 05/11/21 0812  BP: (!) 169/68   Pulse: 75   Resp: 18   Temp: 97.7 F (36.5 C)   SpO2: 98% 96%   P7/19- BP doing stable since starting Ritalin- per HD.  12.  Asthma.  Continue inhalers as directed             - was getting Home O2 prior to admission- will likely need to go home on 2L O2-    Was trying to get at home, but hadn't yet.             7/1- con't nebs prn, home O2 ?PRN only             7/3 lungs very clear today, sats 99% on RA. Some of SOB may be behaviorally related. Reassured pt today that lungs sounded fine.                         -encouraged FV/IS, OOB             7/7: ordered CXT-stable, ordered incentive spirometer 13.Hyperlipidemia.Crestor 14.Obesity.BMI 34.31.Dietery follow up 15.  Diabetes mellitus.hemoglobin AIc 5.2.  CBGs 211-310. Increase Lantus to 13U BID.  7/17- BG's don't look as good- >200- will follow trend before making change yet.   16. Leukocytosis: trending down, repeat Monday             7/11- last WBC was 13k- will recheck in HD  today.              7/13- WBC down to 10.1- will monitor  7/16- Last WBC 12.1- was 12.8 last week- bounced down and went back up again?- con't to monitor, labs in HD 17. Constipation: KUB shows moderate stool burden. Increase senna-docusate to BID.              7/13- LBM yesterday- con't regimen  7/19- LBM 2-3 days ago- will give a dose of Sorbitol at lunch time.   18. Fistula placement: successful, new fistula accessed without complications. 19. Left shoulder pain: ordered lidocaine patch and voltaren gel. 20. Lethargy             7/12- is much improved today- will con't to monitor- CT and CXR of chest were stable             7/16- will readd Melatonin for sleep- never had the chance to use it.   7/17- didn't sleep at all- hopefully Ritalin due day 2.5 mg BID- will help- cannot use Amantadine secondary to HD.   7/19- will try to speak with renal to see if can increase Ritalin to 5 mg BID- give AM dose earlier.    I spent a total of 38 minutes on visit- >50% on coordination of care- d/w husband about what to do about pains- if better after BM, know it's gas/constipation- and will do EMG after d/c

## 2021-05-11 NOTE — Progress Notes (Signed)
Speech Language Pathology Daily Session Note  Patient Details  Name: Carolyn Russo MRN: [id number] Date of Birth: Jun 10, 1951  Today's Date: 05/11/2021 SLP Individual Time: 1025-1035 SLP Individual Time Calculation (min): 10 min  Short Term Goals: Week 1: SLP Short Term Goal 1 (Week 1): Pt will utilize overarticulation to achieve intelligibility at the conversational level in mildly distracting environments with supervision verbal cues. SLP Short Term Goal 2 (Week 1): Pt will consume therapeutic trials of regular textures with supervision cues for use of swallowing precautions over 3 consecutive sessions prior to diet advancement. SLP Short Term Goal 3 (Week 1): Pt will use external aids to recall daily information with mod assist verbal cues. SLP Short Term Goal 4 (Week 1): Pt will complete basic tasks with mod assist verbal cues for functional problem solving. SLP Short Term Goal 5 (Week 1): Pt will sustain her attention to functional tasks for 10 minute intervals with min assist verbal cues for redirection.  Skilled Therapeutic Interventions: #1 SLP attempted to wake pt with verbal, sternal rubs and cold compress. Pt was unable to wake up. Pt missed 45 minutes of skilled treatment. SLP will attempt later today if able.   #2 Skilled ST services focused on cognitive skills. Pt was asleep upon entering room, however able to open eyes on command for a brief period of time. Pt was able to state where she was and took x1 sip of water on command, however verbal output and movement was very limited. SLP facilitated sustained attention and basic problem solving skills in card task identifying 3 difference among 2 card. Pt was able name 1 item on pictures, but unable to note differences with max A verbal cues due to poor alertness fading to 20 second intervals and then eventually unable to keep eyes open or verbally respond. Pt missed 35 of make up session due to not being able to maintain alertness. Pt  was left in room with call bell within reach and chair alarm set. SLP recommends to continue skilled services.     Pain Pain Assessment Pain Scale: 0-10 Pain Score: 0-No pain  Therapy/Group: Individual Therapy  Asuncion Shibata  [facility] 05/11/2021, 11:10 AM

## 2021-05-11 NOTE — Progress Notes (Signed)
Patient ID: Carolyn Russo, female   DOB: 05/30/1951, 70 y.o.   MRN: [id number] S: Very somnolent this morning and had to be nudged several times due to falling asleep in the middle of a sentence.  "I didn't sleep well last night" O:BP (!) 169/68 (BP Location: Left Arm)   Pulse 75   Temp 97.7 F (36.5 C) (Oral)   Resp 18   Ht 5\' 2"  (1.575 m)   Wt 83.1 kg   SpO2 96%   BMI 33.51 kg/m   Intake/Output Summary (Last 24 hours) at 05/11/2021 1140 Last data filed at 05/10/2021 1850 Gross per 24 hour  Intake 200 ml  Output 0 ml  Net 200 ml   Intake/Output: I/O last 3 completed shifts: In: 400 [P.O.:400] Out: 0   Intake/Output this shift:  No intake/output data recorded. Weight change:  PXT:GGYIRSWNI but arouseable CVS:RRR Resp: CTA Abd: +BS, soft, NT/Nd Ext: no edema, RUE AVF +T/B  Recent Labs  Lab 05/05/21 1131 05/07/21 0729  NA 132* 135  K 4.2 4.2  CL 94* 99  CO2 30 27  GLUCOSE 123* 156*  BUN 14 22  CREATININE 5.78* 6.99*  ALBUMIN 3.3* 3.0*  CALCIUM 9.5 8.8*  PHOS 5.1* 4.8*   Liver Function Tests: Recent Labs  Lab 05/05/21 1131 05/07/21 0729  ALBUMIN 3.3* 3.0*   No results for input(s): LIPASE, AMYLASE in the last 168 hours. No results for input(s): AMMONIA in the last 168 hours. CBC: Recent Labs  Lab 05/05/21 1131 05/06/21 0456 05/07/21 0729  WBC 10.1 8.8 12.1*  HGB 11.1* 10.6* 10.6*  HCT 33.2* 31.0* 31.2*  MCV 87.4 88.6 87.4  PLT 391 235 370   Cardiac Enzymes: No results for input(s): CKTOTAL, CKMB, CKMBINDEX, TROPONINI in the last 168 hours. CBG: Recent Labs  Lab 05/10/21 0632 05/10/21 1119 05/10/21 1710 05/10/21 2145 05/11/21 0647  GLUCAP 197* 228* 139* 193* 158*    Iron Studies: No results for input(s): IRON, TIBC, TRANSFERRIN, FERRITIN in the last 72 hours. Studies/Results: No results found.  (feeding supplement) PROSource Plus  30 mL Oral BID BM   aspirin EC  81 mg Oral Daily   B-complex with vitamin C  1 tablet Oral Daily    carvedilol  3.125 mg Oral BID WC   Chlorhexidine Gluconate Cloth  6 each Topical BID   cholecalciferol  2,000 Units Oral Daily   clopidogrel  75 mg Oral Daily   [START ON 05/14/2021] darbepoetin (ARANESP) injection - DIALYSIS  100 mcg Intravenous Q Fri-HD   heparin  5,000 Units Subcutaneous Q8H   insulin aspart  0-6 Units Subcutaneous TID WC   insulin glargine  5 Units Subcutaneous Daily   levETIRAcetam  500 mg Oral QHS   melatonin  3 mg Oral QHS   methylphenidate  2.5 mg Oral BID WC   mometasone-formoterol  2 puff Inhalation BID   pantoprazole  40 mg Oral Daily   rosuvastatin  20 mg Oral Daily   senna-docusate  2 tablet Oral QHS   sevelamer carbonate  800 mg Oral TID WC   sorbitol  30 mL Oral Once    BMET    Component Value Date/Time   NA 135 05/07/2021 0729   NA 145 (H) 02/17/2021 1146   K 4.2 05/07/2021 0729   CL 99 05/07/2021 0729   CO2 27 05/07/2021 0729   GLUCOSE 156 (H) 05/07/2021 0729   BUN 22 05/07/2021 0729   BUN 52 (H) 02/17/2021 1146   CREATININE 6.99 (H)  05/07/2021 0729   CREATININE 0.84 10/13/2011 0903   CALCIUM 8.8 (L) 05/07/2021 0729   GFRNONAA 6 (L) 05/07/2021 0729   GFRNONAA 75 10/13/2011 0903   GFRAA 17 06/19/2020 0000   GFRAA 86 10/13/2011 0903   CBC    Component Value Date/Time   WBC 12.1 (H) 05/07/2021 0729   RBC 3.57 (L) 05/07/2021 0729   HGB 10.6 (L) 05/07/2021 0729   HGB 8.0 (L) 02/17/2021 1146   HCT 31.2 (L) 05/07/2021 0729   HCT 24.0 (L) 02/17/2021 1146   PLT 370 05/07/2021 0729   PLT 376 02/17/2021 1146   MCV 87.4 05/07/2021 0729   MCV 85 02/17/2021 1146   MCH 29.7 05/07/2021 0729   MCHC 34.0 05/07/2021 0729   RDW 16.0 (H) 05/07/2021 0729   RDW 13.4 02/17/2021 1146   LYMPHSABS 3.5 05/03/2021 1044   MONOABS 1.1 (H) 05/03/2021 1044   EOSABS 0.2 05/03/2021 1044   BASOSABS 0.1 05/03/2021 1044    Dialysis Orders: MWF @ [facility] 4 hrs 180NRe 400/Autoflow 1.5 86.5 kg 2.0K/2.0 Ca TDC, heparin 2000 units bolus - Mircera 100 mcg IV q 2 weeks  (last dose 04/14/2021 - Hectorol 5 mcg IV TIW   Assessment/Plan: 1. Acute R subcortical CVA: Back in CIR as of 7/15, transiently back to [facility] 7/14-7/15 with recurrent stroke like symptoms, likely watershed v. hypotensive issue per neuro. 2. Acute LGIB: Hx hemorrhoidal bleeding in past - s/p 2U PRBCs 6/26. S/p EGD/flex sig without clear bleeding source. Plavix and ASA held, now resumed. 3. ESRD: Continue HD per usual MWF schedule - No added heparin. 4. Dialysis Access: S/p 1st stage BVT 7/8 by Dr. Scot Dock - mild steal symptoms, monitoring for now. 5. HTN/volume: Variable BP - trying to avoid intradialytic hypotension, she is well below OP dry weight.  6. Anemia: Hgb 10.6 - continue Aranesp 172mcg q Friday. 7. Secondary hyperparathyroidism: Hectoral on hold for low PTH x 2. Binder changed from Renvela pills to powder d/t issues swallowing pills. Ca/Phos ok for now. 8. Nutrition: Alb low, adding supplements. 9. T2DM: Per primary. 10. Insomnia: Per notes, has been awake at night and sleeping during day - primary trialing ritalin during day and melatonin at night. Watch BP with stimulants.  Donetta Potts, MD [employer] [phone]

## 2021-05-11 NOTE — Progress Notes (Signed)
Occupational Therapy Session Note  Patient Details  Name: Carolyn Russo MRN: [id number] Date of Birth: Jul 01, 1951  Today's Date: 05/11/2021 OT Individual Time: 9390-3009 OT Individual Time Calculation (min): 54 min    Short Term Goals: Week 1:  OT Short Term Goal 1 (Week 1): Pt will complete 2/3 steps of toileting with mod A. OT Short Term Goal 2 (Week 1): Pt will complete standing ADL with min A. OT Short Term Goal 3 (Week 1): Pt will don shirt with MIN A OT Short Term Goal 4 (Week 1): Pt will recall hemi dressing strategies with min VC  Skilled Therapeutic Interventions/Progress Updates:    Pt greeted at time of session semireclined in bed with NT present, hand off to OT. Supine > sit Min A for full trunk elevation and stand pivot > w/c no AD Min A and HHA. Transported to bathroom and stand pivot to toilet same manner but using grab bar. Mod A toileting tasks wearing a gown only and pt able to perform hygiene in standing. Set up at sink for hand hygiene and pt wanting to wash up at this time as well, UB bathing only per pt request. UB dress Mod with hemitechnique and LB dressing Max A to thread and assist donning over buttocks. Utilizing RUE throughout with cues to hold tissue paper, bathe self, dress self, etc. Pt very self limiting throughout saying "can you do it for me?" And encouraged to attempt each activity. Set up for breakfast and encouraged to use RUE for eating oatmeal, using built up handle. Husband present for entire session and providing encouragement. Pt left with husband supervising self feeding (approved via NT), alarm on call bell in reach.   Therapy Documentation Precautions:  Precautions Precautions: Fall, Other (comment) Precaution Comments: L inattention, L hemiparesis, seizure protocol, impaired R UE coordination, R port a cath Restrictions Weight Bearing Restrictions: No    Therapy/Group: Individual Therapy  [name] 05/11/2021, 7:13 AM

## 2021-05-11 NOTE — Telephone Encounter (Signed)
Pt's husband is returning a call to [facility]

## 2021-05-11 NOTE — Progress Notes (Signed)
Physical Therapy Session Note  Patient Details  Name: Carolyn Russo MRN: [id number] Date of Birth: 28-May-1951  Today's Date: 05/11/2021 PT Individual Time: 1530-1630 PT Individual Time Calculation (min): 60 min   Short Term Goals: Week 1:  PT Short Term Goal 1 (Week 1): Pt will perform supine<>sit with min assist PT Short Term Goal 2 (Week 1): Pt will perform sit<>stands using LRAD with CGA PT Short Term Goal 3 (Week 1): Pt will perform bed<>chair transfers using LRAD with CGA PT Short Term Goal 4 (Week 1): Pt will ambulate at least 23ft using LRAD with min assist PT Short Term Goal 5 (Week 1): Pt will ascend/descend 4 steps using B HRs with min assist  Skilled Therapeutic Interventions/Progress Updates:    Pt received seated in w/c in room, requesting to use the restroom. Pt with no complaints of pain this date. Sit to stand with min A to RW. Ambulatory transfer into bathroom with RW and min A, max cueing for safe RW management and assist with R hand grasp on RW. Toilet transfer with min A. Pt is max A for clothing management and pericare. Ambulation 2 x 18 ft with RW and min A for balance, cues for safe RW management, assist for R hand grasp, L lateral lean noted with gait. Pt reports feeling that she is going to "collapse" during gait and unable to ambulate further. Seated RUE fine motor task completing ping pong ball design, assist needed to retrieve balls from egg carton. Pt requests to return to bed at end of session. Sit to supine min A for LLE management. Pt left seated in bed with BUE elevated on pillows, needs in reach, bed alarm in place.  Therapy Documentation Precautions:  Precautions Precautions: Fall, Other (comment) Precaution Comments: L inattention, L hemiparesis, seizure protocol, impaired R UE coordination, R port a cath Restrictions Weight Bearing Restrictions: No    Therapy/Group: Individual Therapy   Excell Seltzer, PT, DPT, CSRS  05/11/2021, 5:18 PM

## 2021-05-11 NOTE — Progress Notes (Signed)
Occupational Therapy Session Note  Patient Details  Name: Carolyn Russo MRN: [id number] Date of Birth: [birth year]  Today's Date: 05/11/2021 OT Individual Time: 1300-1345 OT Individual Time Calculation (min): 45 min    Short Term Goals: Week 1:  OT Short Term Goal 1 (Week 1): Pt will complete 2/3 steps of toileting with mod A. OT Short Term Goal 2 (Week 1): Pt will complete standing ADL with min A. OT Short Term Goal 3 (Week 1): Pt will don shirt with MIN A OT Short Term Goal 4 (Week 1): Pt will recall hemi dressing strategies with min VC  Skilled Therapeutic Interventions/Progress Updates:  Pt greeted seated in w/c agreeable to OT intervention. Pt reports lethargy and noted to fall asleep frequently during session however pt reports "I'm not sleeping, I'm just thinking." Session focus on RUE [facility] and standing tolerance. Pt able to stand ~ 6 mins to stack jenga blocks before pt reported fatigue needing to sit down. Continued Frederickson work from sitting with pt retrieving small foam blocks and placing blocks in cup, graded task up and had pt place blocks on target positioned on pts table, pt reports increased difficulty with this task d/t pt needing accuracy with reaching to target, pt continues to demonstrate decreased AROM of L shoulder but reports "its just my joints." Pt able to complete household distance functional mobility within pts room  ( ~ 55ft) with Rw and MIN A for balance and assist to manage RW during transitions. Pt left seated in w/c with alarm belt activated and all needs within reach.   Therapy Documentation Precautions:  Precautions Precautions: Fall, Other (comment) Precaution Comments: L inattention, L hemiparesis, seizure protocol, impaired R UE coordination, R port a cath Restrictions Weight Bearing Restrictions: No  Pain: Pt reports pain in R jaw and underneath L breast, provided rest breaks and repositioing as pain mgmt during session.    Therapy/Group: Individual  Therapy  Precious Haws 05/11/2021, 3:56 PM

## 2021-05-11 NOTE — Telephone Encounter (Signed)
Spoke with husband and made him aware that we would not be completing the disability paperwork since pt did not have a cardiac reason for disability.  Husband appreciative for call.

## 2021-05-11 NOTE — Progress Notes (Signed)
Patient ID: Carolyn Russo, female   DOB: 1950-10-25, 70 y.o.   MRN: [id number]  SW met with pt in room to provide updates from team conference, and d/c date 8/4. SW completed assessment with pt.   1416- SW called pt husband who reported he will call SW back.  1419-SW provided updates to Greg/Dialysis Coord [phone]) to inform on pt change in d/c date. He will f/u if needed.   1430- SW spoke with pt husband to discuss above. SW shared pt current level is Mid to Mod A overall, and goal of supervision level of care. SW discussed with pt husband there will be a plan for family education closer towards d/c. SW also confirmed with him there will be discussion about DME once aware on pt care needs. SW will update next week after team conference.   Loralee Pacas, MSW, [place] Office: [phone] Cell: [phone] Fax: [phone]

## 2021-05-12 ENCOUNTER — Inpatient Hospital Stay (HOSPITAL_COMMUNITY): Payer: Medicare Other

## 2021-05-12 LAB — GLUCOSE, CAPILLARY
Glucose-Capillary: 186 mg/dL — ABNORMAL HIGH (ref 70–99)
Glucose-Capillary: 359 mg/dL — ABNORMAL HIGH (ref 70–99)
Glucose-Capillary: 241 mg/dL — ABNORMAL HIGH (ref 70–99)
Glucose-Capillary: 297 mg/dL — ABNORMAL HIGH (ref 70–99)

## 2021-05-12 IMAGING — CR DG ABDOMEN 1V
2 series · 2 of 2 positions shown · non-contrast
Comparison: None.

CLINICAL DATA: Acute left upper quadrant pain. Left upper quadrant
pain when taking deep breath and rolls and bed.

EXAM:
ABDOMEN - 1 VIEW

[abdomen kub (1 of 2)]
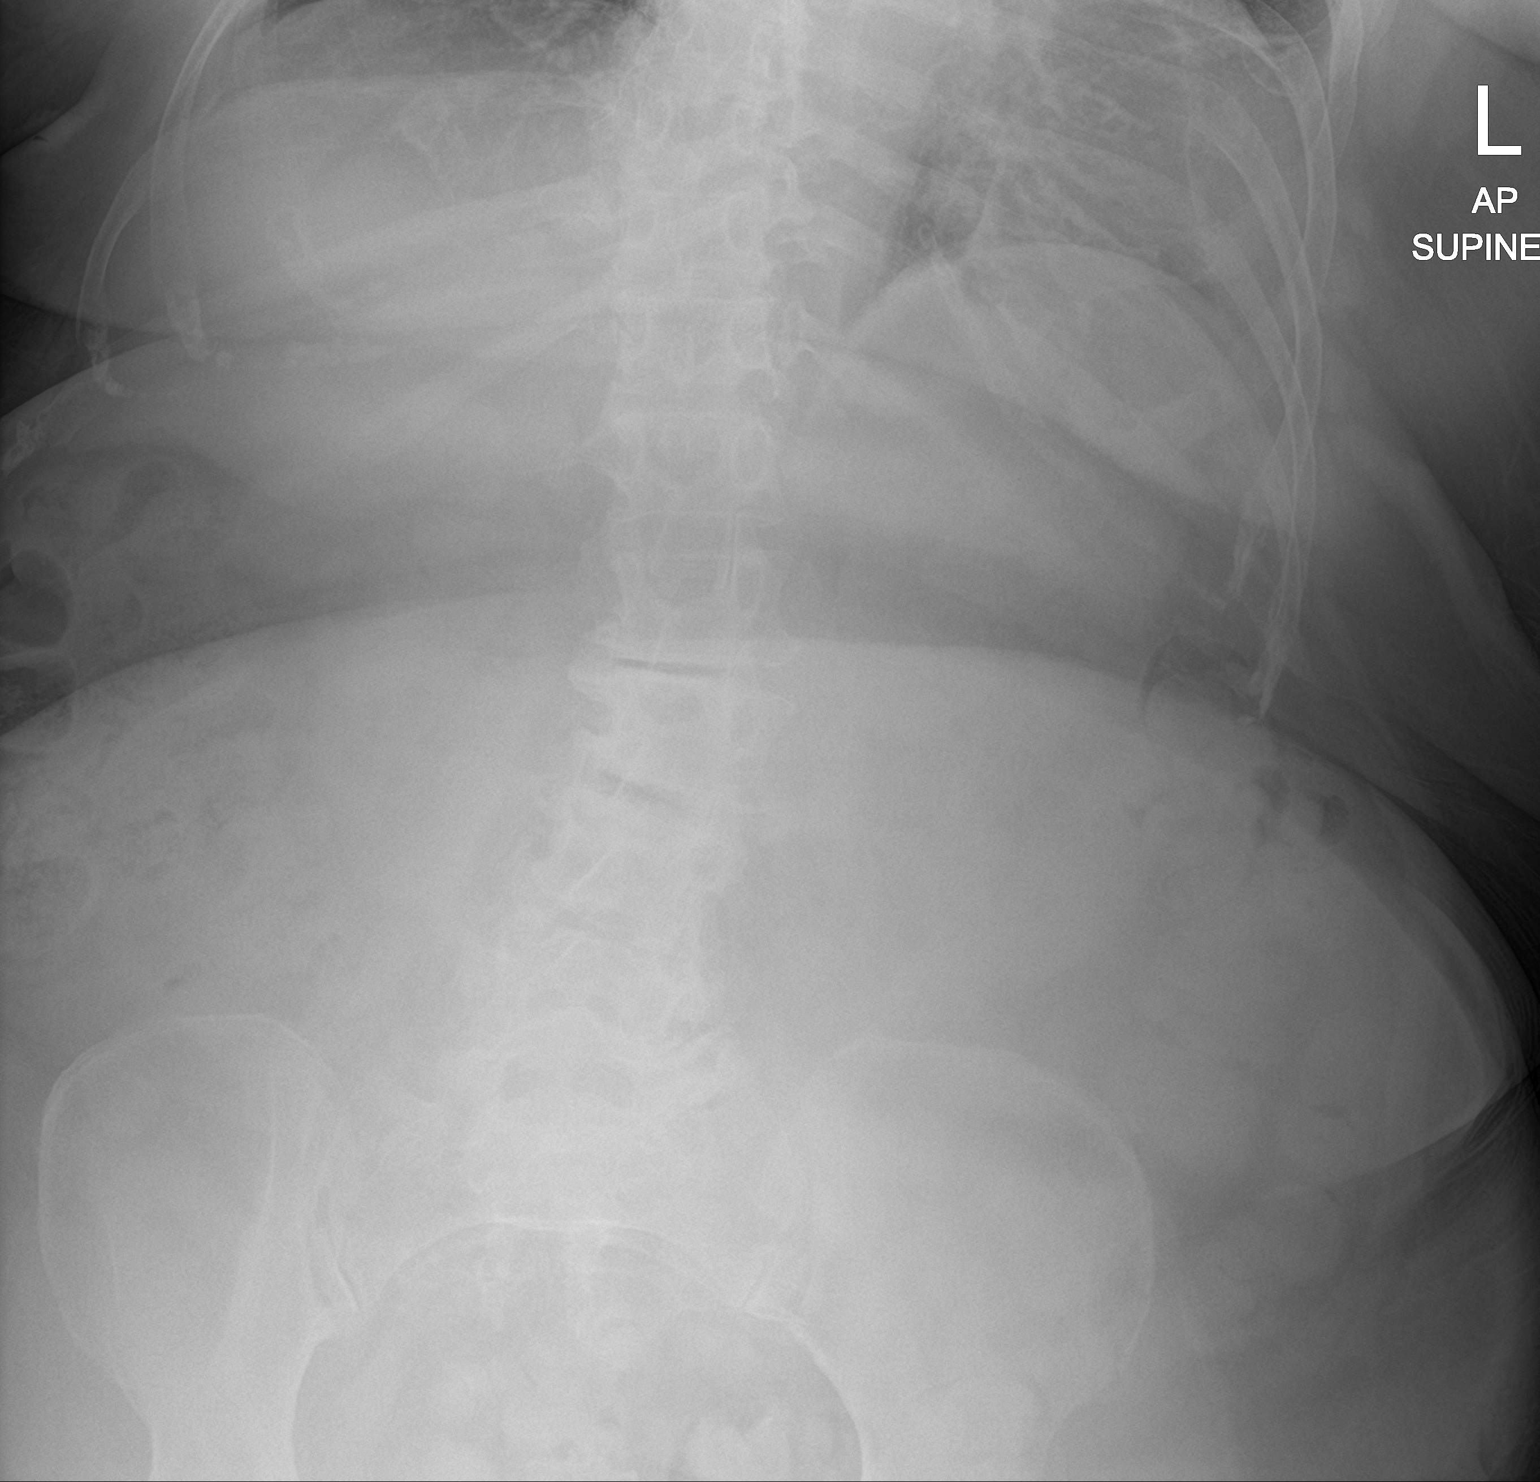

[abdomen kub (2 of 2)]
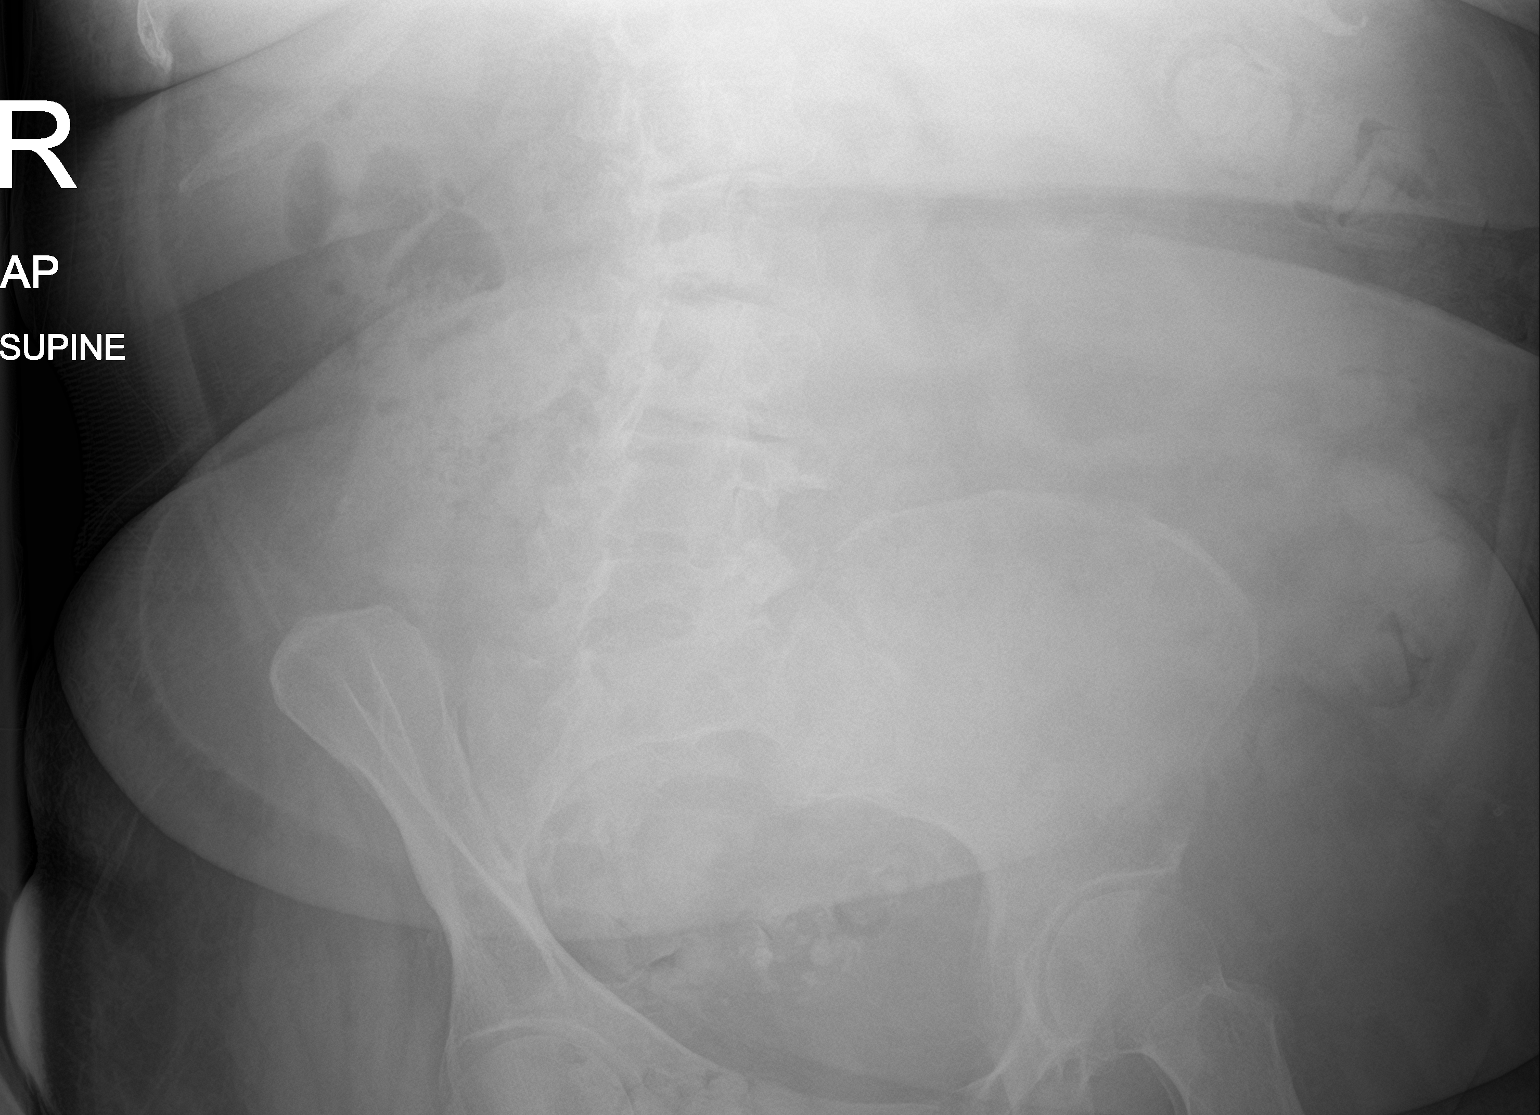

[2 of 2 positions shown; findings below may reference images not displayed]

FINDINGS: Divided supine views of the abdomen obtained. No evidence of free
intra-abdominal air. No bowel dilatation to suggest obstruction.
Moderate volume of stool throughout the entire colon. No abnormal
rectal distention. No visceral thigh assess or radiopaque calculi,
detailed assessment limited by habitus. Scoliosis and degenerative
change in the spine.
IMPRESSION: Nonobstructive bowel gas pattern with moderate volume of colonic
stool.

## 2021-05-12 MED ORDER — METHYLPHENIDATE HCL 5 MG PO TABS
5.0000 mg | ORAL_TABLET | Freq: Two times a day (BID) | ORAL | Status: DC
  Administered 2021-05-12 – 2021-05-13 (×2): 5 mg via ORAL
  Filled 2021-05-12: qty 1

## 2021-05-12 MED ORDER — INSULIN GLARGINE 100 UNIT/ML ~~LOC~~ SOLN
8.0000 [IU] | Freq: Every day | SUBCUTANEOUS | Status: DC
  Administered 2021-05-13 – 2021-05-14 (×2): 8 [IU] via SUBCUTANEOUS
  Filled 2021-05-12 (×2): qty 0.08

## 2021-05-12 MED ORDER — SORBITOL 70 % SOLN
60.0000 mL | Freq: Once | Status: AC
  Administered 2021-05-12: 60 mL via ORAL

## 2021-05-12 MED ORDER — TRAZODONE HCL 50 MG PO TABS
50.0000 mg | ORAL_TABLET | Freq: Every day | ORAL | Status: DC
  Administered 2021-05-12: 50 mg via ORAL
  Filled 2021-05-12: qty 1

## 2021-05-12 NOTE — Progress Notes (Signed)
Pt very tired this am, awoke when belt alarm was going off. John from speech entered room. Sleep chart notes pt only slept one hour at 5am. Pt able to name staff and faces. Pt able to take medication this am once waking up, Pt given new dose of ritalin 5mg .  Carolyn Russo

## 2021-05-12 NOTE — Progress Notes (Signed)
Physical Therapy Session Note  Patient Details  Name: Carolyn Russo MRN: [id number] Date of Birth: 03-05-1951  Today's Date: 05/12/2021 PT Individual Time: 0800-0900 PT Individual Time Calculation (min): 60 min   Short Term Goals: Week 1:  PT Short Term Goal 1 (Week 1): Pt will perform supine<>sit with min assist PT Short Term Goal 2 (Week 1): Pt will perform sit<>stands using LRAD with CGA PT Short Term Goal 3 (Week 1): Pt will perform bed<>chair transfers using LRAD with CGA PT Short Term Goal 4 (Week 1): Pt will ambulate at least 77ft using LRAD with min assist PT Short Term Goal 5 (Week 1): Pt will ascend/descend 4 steps using B HRs with min assist  Skilled Therapeutic Interventions/Progress Updates:    Pt received seated in w/c in room eating breakfast with Supervision assist from NT, this therapist takes over. Assisted pt with setting up built up handles on fork for improved grip with use of RUE, encouraged use of RUE as well as LUE. Assisted pt with donning pants, shirt, socks, and shoes while seated in w/c. Sit to stand with CGA to RW this date, max cueing for safe hand placement during transfer. Ambulation x 103', x 58', x 25 ft with RW and min A for balance. Pt self-limiting at times and requesting to sit down, with cues able to increase distance. Pt with onset of L lateral lean that increases with fatigue, able to correct with cueing. Pt left seated in w/c in room with needs in reach, quick release belt and chair alarm in place at end of session.  Therapy Documentation Precautions:  Precautions Precautions: Fall, Other (comment) Precaution Comments: L inattention, L hemiparesis, seizure protocol, impaired R UE coordination, R port a cath Restrictions Weight Bearing Restrictions: No    Therapy/Group: Individual Therapy   Excell Seltzer, PT, DPT, CSRS  05/12/2021, 5:21 PM

## 2021-05-12 NOTE — Progress Notes (Signed)
Speech Language Pathology Daily Session Note  Patient Details  Name: Carolyn Russo MRN: [id number] Date of Birth: 08/30/1951  Today's Date: 05/12/2021 SLP Individual Time: 1000-1100 SLP Individual Time Calculation (min): 60 min  Short Term Goals: Week 1: SLP Short Term Goal 1 (Week 1): Pt will utilize overarticulation to achieve intelligibility at the conversational level in mildly distracting environments with supervision verbal cues. SLP Short Term Goal 2 (Week 1): Pt will consume therapeutic trials of regular textures with supervision cues for use of swallowing precautions over 3 consecutive sessions prior to diet advancement. SLP Short Term Goal 3 (Week 1): Pt will use external aids to recall daily information with mod assist verbal cues. SLP Short Term Goal 4 (Week 1): Pt will complete basic tasks with mod assist verbal cues for functional problem solving. SLP Short Term Goal 5 (Week 1): Pt will sustain her attention to functional tasks for 10 minute intervals with min assist verbal cues for redirection.  Skilled Therapeutic Interventions:   Patient seen for skilled ST session focusing on speech, cognitive and swallow function goals. Initially, patient was lethargic and had difficulty keeping eyes open, however alertness improved as session progressed. Patient recalled several therapist's names and was able to demonstrate recall of recent medical interventions. She benefited from [place] for redirecting attention during structured tasks and conversation. She requires modA cues to initiate and maintain attention/effort in attempts to perform difficult tasks. During intake of lunch meal (Dys 3 solids) patient exhibited prolonged mastication but no significant oral residuals and no overt s/s aspiration or penetration. Patient continues to benefit from skilled SLP intervention to maximize cognitive-linguistic, speech and swallow function goals prior to discharge.  Pain Pain Assessment Pain  Scale: 0-10 Pain Score: 0-No pain  Therapy/Group: Individual Therapy  Sonia Baller, MA, CCC-SLP Speech Therapy

## 2021-05-12 NOTE — Progress Notes (Signed)
Patient ID: Carolyn Russo, female   DOB: [birth year], 70 y.o.   MRN: [id number] S: More awake and alert this morning but still having trouble with sleep O:BP (!) 154/79 (BP Location: Left Arm)   Pulse 83   Temp 98.6 F (37 C) (Oral)   Resp 18   Ht 5\' 2"  (1.575 m)   Wt 82.3 kg   SpO2 100%   BMI 33.19 kg/m   Intake/Output Summary (Last 24 hours) at 05/12/2021 1044 Last data filed at 05/12/2021 0820 Gross per 24 hour  Intake 500 ml  Output --  Net 500 ml   Intake/Output: I/O last 3 completed shifts: In: 380 [P.O.:380] Out: -   Intake/Output this shift:  Total I/O In: 120 [P.O.:120] Out: -  Weight change: -0.1 kg Gen:NAD CVS: RRR Resp: CTA Abd: benign Ext: no edema, RUE AVF +T/B  Recent Labs  Lab 05/05/21 1131 05/07/21 0729  NA 132* 135  K 4.2 4.2  CL 94* 99  CO2 30 27  GLUCOSE 123* 156*  BUN 14 22  CREATININE 5.78* 6.99*  ALBUMIN 3.3* 3.0*  CALCIUM 9.5 8.8*  PHOS 5.1* 4.8*   Liver Function Tests: Recent Labs  Lab 05/05/21 1131 05/07/21 0729  ALBUMIN 3.3* 3.0*   No results for input(s): LIPASE, AMYLASE in the last 168 hours. No results for input(s): AMMONIA in the last 168 hours. CBC: Recent Labs  Lab 05/05/21 1131 05/06/21 0456 05/07/21 0729  WBC 10.1 8.8 12.1*  HGB 11.1* 10.6* 10.6*  HCT 33.2* 31.0* 31.2*  MCV 87.4 88.6 87.4  PLT 391 235 370   Cardiac Enzymes: No results for input(s): CKTOTAL, CKMB, CKMBINDEX, TROPONINI in the last 168 hours. CBG: Recent Labs  Lab 05/11/21 0647 05/11/21 1206 05/11/21 1632 05/11/21 2134 05/12/21 0622  GLUCAP 158* 162* 233* 249* 186*    Iron Studies: No results for input(s): IRON, TIBC, TRANSFERRIN, FERRITIN in the last 72 hours. Studies/Results: No results found.  (feeding supplement) PROSource Plus  30 mL Oral BID BM   aspirin EC  81 mg Oral Daily   B-complex with vitamin C  1 tablet Oral Daily   carvedilol  3.125 mg Oral BID WC   Chlorhexidine Gluconate Cloth  6 each Topical BID   cholecalciferol   2,000 Units Oral Daily   clopidogrel  75 mg Oral Daily   [START ON 05/14/2021] darbepoetin (ARANESP) injection - DIALYSIS  100 mcg Intravenous Q Fri-HD   heparin  5,000 Units Subcutaneous Q8H   insulin aspart  0-6 Units Subcutaneous TID WC   insulin glargine  5 Units Subcutaneous Daily   levETIRAcetam  500 mg Oral QHS   melatonin  3 mg Oral QHS   methylphenidate  5 mg Oral BID WC   mometasone-formoterol  2 puff Inhalation BID   pantoprazole  40 mg Oral Daily   rosuvastatin  20 mg Oral Daily   senna-docusate  2 tablet Oral QHS   sevelamer carbonate  800 mg Oral TID WC   traZODone  50 mg Oral QHS    BMET    Component Value Date/Time   NA 135 05/07/2021 0729   NA 145 (H) 02/17/2021 1146   K 4.2 05/07/2021 0729   CL 99 05/07/2021 0729   CO2 27 05/07/2021 0729   GLUCOSE 156 (H) 05/07/2021 0729   BUN 22 05/07/2021 0729   BUN 52 (H) 02/17/2021 1146   CREATININE 6.99 (H) 05/07/2021 0729   CREATININE 0.84 10/13/2011 0903   CALCIUM 8.8 (L) 05/07/2021 9562  GFRNONAA 6 (L) 05/07/2021 0729   GFRNONAA 75 10/13/2011 0903   GFRAA 17 06/19/2020 0000   GFRAA 86 10/13/2011 0903   CBC    Component Value Date/Time   WBC 12.1 (H) 05/07/2021 0729   RBC 3.57 (L) 05/07/2021 0729   HGB 10.6 (L) 05/07/2021 0729   HGB 8.0 (L) 02/17/2021 1146   HCT 31.2 (L) 05/07/2021 0729   HCT 24.0 (L) 02/17/2021 1146   PLT 370 05/07/2021 0729   PLT 376 02/17/2021 1146   MCV 87.4 05/07/2021 0729   MCV 85 02/17/2021 1146   MCH 29.7 05/07/2021 0729   MCHC 34.0 05/07/2021 0729   RDW 16.0 (H) 05/07/2021 0729   RDW 13.4 02/17/2021 1146   LYMPHSABS 3.5 05/03/2021 1044   MONOABS 1.1 (H) 05/03/2021 1044   EOSABS 0.2 05/03/2021 1044   BASOSABS 0.1 05/03/2021 1044   Dialysis Orders: MWF @ [facility] 4 hrs 180NRe 400/Autoflow 1.5 86.5 kg 2.0K/2.0 Ca TDC, heparin 2000 units bolus - Mircera 100 mcg IV q 2 weeks (last dose 04/14/2021 - Hectorol 5 mcg IV TIW   Assessment/Plan: 1. Acute R subcortical CVA: Back in CIR  as of 7/15, transiently back to [facility] 7/14-7/15 with recurrent stroke like symptoms, likely watershed v. hypotensive issue per neuro. 2. Acute LGIB: Hx hemorrhoidal bleeding in past - s/p 2U PRBCs 6/26. S/p EGD/flex sig without clear bleeding source. Plavix and ASA held, now resumed. 3. ESRD: Continue HD per usual MWF schedule - No added heparin. 4. Dialysis Access: S/p 1st stage BVT 7/8 by Dr. Scot Dock - mild steal symptoms, monitoring for now. 5. HTN/volume: Variable BP - trying to avoid intradialytic hypotension, she is well below OP dry weight. 6. Anemia: Hgb 10.6 - continue Aranesp 149mcg q Friday. 7. Secondary hyperparathyroidism: Hectoral on hold for low PTH x 2. Binder changed from Renvela pills to powder d/t issues swallowing pills. Ca/Phos ok for now. 8. Nutrition: Alb low, adding supplements. 9. T2DM: Per primary. 10. Insomnia: Per notes, has been awake at night and sleeping during day - primary trialing ritalin during day and melatonin at night. Watch BP with stimulants.  Donetta Potts, MD [employer] [phone]

## 2021-05-12 NOTE — Progress Notes (Signed)
Occupational Therapy Session Note  Patient Details  Name: Carolyn Russo MRN: [id number] Date of Birth: September 09, 1951  Today's Date: 05/12/2021 OT Individual Time: 1100-1155 OT Individual Time Calculation (min): 55 min    Short Term Goals: Week 1:  OT Short Term Goal 1 (Week 1): Pt will complete 2/3 steps of toileting with mod A. OT Short Term Goal 2 (Week 1): Pt will complete standing ADL with min A. OT Short Term Goal 3 (Week 1): Pt will don shirt with MIN A OT Short Term Goal 4 (Week 1): Pt will recall hemi dressing strategies with min VC   Skilled Therapeutic Interventions/Progress Updates:    Pt greeted at time of session sitting up in wheelchair finishing up SLP session eating lunch, OT resumed care for full supervision for lunch. Pt with hamburger cut up into fourths for easier in hand manipulation and improving self feeding. Able to eat hamburger with Supervision and encouraged to use R hand for tasks. Pt eating mashed potatoes and collard greens with build up handles on utensils and needing Min A for correct placement of utensils in hand and cues for small bites as well as attending to task as pt started to fall asleep d/t lethargy, however alertness still improved today vs yesterday. Husband entered at this time and stayed for remainder of session. UB dressing needing Mod A to doff clothing and cues for sequencing and problem solving, reporting mild discomfort in L shoulder with doffing clothing. Donned new button up shirt for dialysis Max A to thread, pull around shoulder, sequence and button. Attempting buttoning with hand over hand but too difficult. Stand pivot chair > bed  in prep for dialysis with Min A and RW. Sit > supine Supervision. Alarm on call bell in reach.    Therapy Documentation Precautions:  Precautions Precautions: Fall, Other (comment) Precaution Comments: L inattention, L hemiparesis, seizure protocol, impaired R UE coordination, R port a cath Restrictions Weight  Bearing Restrictions: No    Therapy/Group: Individual Therapy  [name] 05/12/2021, 7:22 AM

## 2021-05-12 NOTE — Progress Notes (Signed)
Patient ID: Carolyn Russo, female   DOB: 22-May-1951, 70 y.o.   MRN: [id number]  SW updated [facility] on pt change in d/c date to 8/4.  Loralee Pacas, MSW, [place] Office: [phone] Cell: [phone] Fax: [phone]

## 2021-05-12 NOTE — Progress Notes (Signed)
PROGRESS NOTE     Subjective/Complaints:  Pt reports no BM with Sorbitol- will give Sorbitol this AM- since has HD this afternoon.   Also still having intermittent pain in LUQ when takes a deep breathe in- very sharp. Can wake her up/keep her awake.  Admits sleeping during day and not at night.  Poor sleep- actually "horrible", but think some of this is sleeping during day all day- only need so much sleep.   BP looks better so will increase Ritalin to 5mg  BID to see if will help with attention/wakefulness as well.       ROS:  Pt denies SOB, abd pain, CP, N/V/C/D, and vision changes   Objective:       Physical Exam: Vital Signs    Vitals with BMI 05/12/2021 05/12/2021 05/12/2021  Height - - -  Weight - 181 lbs 7 oz -  BMI - 55.73 -  Systolic 220 254 270  Diastolic 79 79 54  Pulse 80 83 78              General: awake, alert, appropriate, sitting up in w/c at bedside; more interactive and initiating concerns herself today; PT in room; NAD HENT: conjugate gaze; oropharynx moist CV: regular rate; no JVD Pulmonary: CTA B/L; no W/R/R- good air movement- sounds clear GI: soft, NT, (+)BS; hypoactive; distended- somewhat Psychiatric: appropriate, more interactive; initiating concerns more;  Neurological: still appears somewhat foggy, delayed, but better than last few days  Musculoskeletal: R hand/forearm cooler than L side- but not COLD-    no change today    General: No swelling.    Cervical back: Normal range of motion.    Comments: Mild tenderness left shoulder with ER/IR today  Skin:    General: Skin is warm and dry. Neurological:    Comments: alert, follows commands.   Fair insight and awareness. Provided biographical information. Normal language, speech slightly slurred. Mild left central 7. LUE 2 to 2+/5 deltoid, 3/5 biceps, triceps and 3+/5 wrist and HI. LLE 3-4/5 prox to distal. RUE and RLE 4-5/5 prox to  distal. No sensory deficits noted--motor/sensory exam stable today.  also cannot do finger touching to each finger/thumb- not able to do any dexterity movements, or hold pen.    Assessment/Plan: 1. Functional deficits which require 3+ hours per day of interdisciplinary therapy in a comprehensive inpatient rehab setting. Physiatrist is providing close team supervision and 24 hour management of active medical problems listed below. Physiatrist and rehab team continue to assess barriers to discharge/monitor patient progress toward functional and medical goals   Care Tool:   Bathing   Body parts bathed by patient: Right arm, Left arm, Chest, Abdomen, Front perineal area, Right upper leg, Left upper leg    Body parts bathed by helper: Right lower leg, Left lower leg, Buttocks    Bathing assist Assist Level: Minimal Assistance - Patient > 75%    Upper Body Dressing/Undressing Upper body dressing What is the patient wearing?: Pull over shirt   Upper body assist Assist Level: Minimal Assistance - Patient > 75%   Lower Body Dressing/Undressing Lower body dressing       What is the patient wearing?:  Pants, Incontinence brief        Lower body assist Assist for lower body dressing: Minimal Assistance - Patient > 75%     Toileting Toileting   Toileting assist Assist for toileting: Moderate Assistance - Patient 50 - 74%    Transfers Chair/bed transfer   Transfers assist     Chair/bed transfer assist level: Minimal Assistance - Patient > 75%    Locomotion Ambulation     Ambulation assist       Assist level: Minimal Assistance - Patient > 75% Assistive device: Walker-rolling Max distance: 50'    Walk 10 feet activity     Assist     Assist level: Minimal Assistance - Patient > 75% Assistive device: Walker-rolling    Walk 50 feet activity     Assist Walk 50 feet with 2 turns activity did not occur: Safety/medical concerns   Assist level: Minimal Assistance - Patient  > 75% Assistive device: Walker-rolling      Walk 150 feet activity     Assist Walk 150 feet activity did not occur: Safety/medical concerns        Walk 10 feet on uneven surface activity     Assist Walk 10 feet on uneven surfaces activity did not occur: Safety/medical concerns         Wheelchair         Assist Will patient use wheelchair at discharge?:  (TBD) Type of Wheelchair: Manual   Wheelchair assist level: Dependent - Patient 0% Max wheelchair distance: 150'      Wheelchair 50 feet with 2 turns activity       Assist           Assist Level: Dependent - Patient 0%    Wheelchair 150 feet activity       Assist       Assist Level: Dependent - Patient 0%     Medical Problem List and Plan: 1.  Left side weakness secondary to nonhemorrhagic right basal ganglia infarction             -patient may shower Reviewed noted 2 day interrupted stay 7/13--7/15 for altered MS , no new CVA identified             con't OT, OT and SLP- will add Ritalin for lethargy and initiation- however due to BP being on high side, will do 2.5 mg with breakfast and lunch. -con't PT, OT and SLP- will increase Ritalin and hope SLP "isn't as boring" per pt.  2.  Impaired mobility: -DVT/anticoagulation:  continue SCD             -antiplatelet therapy:ASA             Plavix restarted. 3. Right hip bursitis: continue Tramadol 50 mg every 12 hours as needed, ice, ROM, lidocaine patch 4. Anxiety/stroke poor initiation: continue Xanax 0.5 mg 3 times daily as needed.  Provide emotional support             7/12- per husband, concerned about flat affect- will d/w possible depression with pt- if so, will start SSRI.              7/13- will wait for now per husband  7/16- will d/w husband when sees next?  7/17- adding ritalin for initiation/sedation during day.   7/19- will increase Ritalin if OK with renal tomorrow and move AM dose to earlier.   7/20- BP looks OK- will increase to 5 mg  BID             -  antipsychotic agents: N/A 5. Neuropsych: This patient is ?capable of making decisions on her own behalf. 6. Skin/Wound Care: Routine skin checks 7. Fluids/Electrolytes/Nutrition: Routine in and outs with follow-up chemistries 8.  Acute lower GI bleed with blood loss anemia.  Follow-up GI services.  Resume ASA as above. - Continue Protonix 40 mg twice daily 7/6: hgb 9.6--monitor for gross bleeding 7/11- last Hb stable at 9.8- will recheck this week             7/12- Hb came back at 9.7- stable- con't regimen             7/13- Hb up to 11.1- will con't to monitor  7/17- Hb 10.6- con't regimen On Aranesp q Friday for anemia or chronic disease due to ESRD 9.  Chronic diastolic congestive heart failure.  Monitor for any signs of fluid overload.  Continue Lasix 40-80 mg Monday Wednesday Friday             -daily weights, have been stable             7/12- weight bounced up 1 kg today, however was this level 2-3 days ago, so think yesterday's weight might have been slightly off- will monitor  7/16- weight much better- con't to monitor  7/20- weight doing well- stable- con't to monitor 10.  Newly Dx'd End-stage renal disease.  Hemodialysis as per renal services             7/12- questionable steal syndrome since Fistula placement- will monitor per Vascular- to see if  needs additional intervention  7/19- R hand still cooler to touch- also has decreased dexterity- will likely need EMG after d/c.  11.  Hypertension.  Increase Coreg to 12.5 mg twice daily. Aggressive filtration with ultradialysis tomorrow             7/11- HD today- BP 170s/90s this AM- per renal.             7/12- BP dropped to 70s/50s with HD yesterday- back up to 150s/80s today- con't reigmen  7/17- BP 150s/160s- per renal- will only start 2.5 mg of Ritalin due to BP issues- will monitor closely.  Vitals:   05/12/21 0508 05/12/21 1335  BP: (!) 154/79 (!) 181/79  Pulse: 83 80  Resp: 18 20  Temp: 98.6 F (37 C)  (!) 97.4 F (36.3 C)  SpO2: 100% 100%   P7/19- BP doing stable since starting Ritalin- per HD.   7/20- BP looked good this AM- this afternoon, up to 181/79- will d/w renal.  12.  Asthma.  Continue inhalers as directed             - was getting Home O2 prior to admission- will likely need to go home on 2L O2-   Was trying to get at home, but hadn't yet.             7/1- con't nebs prn, home O2 ?PRN only             7/3 lungs very clear today, sats 99% on RA. Some of SOB may be behaviorally related. Reassured pt today that lungs sounded fine.                         -encouraged FV/IS, OOB             7/7: ordered CXT-stable, ordered incentive spirometer 13.Hyperlipidemia.Crestor 14.Obesity.BMI 34.31.Dietery follow up 15.  Diabetes mellitus.hemoglobin AIc 5.2.  CBGs  211-310. Increase Lantus to 13U BID.  7/17- BG's don't look as good- >200- will follow trend before making change yet.   7/20- increased Lantus to 8 units daily- will monitor- increased from 5 units.    16. Leukocytosis: trending down, repeat Monday             7/11- last WBC was 13k- will recheck in HD today.              7/13- WBC down to 10.1- will monitor  7/16- Last WBC 12.1- was 12.8 last week- bounced down and went back up again?- con't to monitor, labs in HD  7/20- will see if they can get f/u labs in HD 17. Constipation: KUB shows moderate stool burden. Increase senna-docusate to BID.              7/13- LBM yesterday- con't regimen  7/19- LBM 2-3 days ago- will give a dose of Sorbitol at lunch time. 7/20- will give another dose of Sorbitol since cannot give Mg- early this AM- so might go before HD- KUB shows moderate volume of stool throughout colon- could be cause of LUQ pain.    18. Fistula placement: successful, new fistula accessed without complications.  7/20- possible steal syndrome of RUE- cooler hand on R- might need EMG after d/c.  19. Left shoulder pain: ordered lidocaine patch and voltaren gel. 20. Lethargy              7/12- is much improved today- will con't to monitor- CT and CXR of chest were stable             7/16- will readd Melatonin for sleep- never had the chance to use it.   7/17- didn't sleep at all- hopefully Ritalin due day 2.5 mg BID- will help- cannot use Amantadine secondary to HD.   7/19- will try to speak with renal to see if can increase Ritalin to 5 mg BID- give AM dose earlier.   7/20- will increase Ritalin to 5 mg BID-  21. Insomnia/mixing of sleep wake cycle  7/20- will try Trazodone 50 mg QHS and see if can get back on good sleep cycle? Encouraged pt/staff to try and keep her awake more during day.

## 2021-05-13 ENCOUNTER — Telehealth: Payer: Self-pay | Admitting: Interventional Cardiology

## 2021-05-13 LAB — GLUCOSE, CAPILLARY
Glucose-Capillary: 173 mg/dL — ABNORMAL HIGH (ref 70–99)
Glucose-Capillary: 251 mg/dL — ABNORMAL HIGH (ref 70–99)
Glucose-Capillary: 139 mg/dL — ABNORMAL HIGH (ref 70–99)
Glucose-Capillary: 239 mg/dL — ABNORMAL HIGH (ref 70–99)

## 2021-05-13 MED ORDER — ONDANSETRON HCL 4 MG/2ML IJ SOLN
INTRAMUSCULAR | Status: AC
  Administered 2021-05-13: 4 mg via INTRAVENOUS_CENTRAL
  Filled 2021-05-13: qty 2

## 2021-05-13 MED ORDER — HEPARIN SODIUM (PORCINE) 1000 UNIT/ML IJ SOLN
INTRAMUSCULAR | Status: AC
  Filled 2021-05-13: qty 4

## 2021-05-13 MED ORDER — POLYETHYLENE GLYCOL 3350 17 G PO PACK
34.0000 g | PACK | Freq: Once | ORAL | Status: AC
  Administered 2021-05-13: 34 g via ORAL

## 2021-05-13 MED ORDER — POLYETHYLENE GLYCOL 3350 17 G PO PACK
17.0000 g | PACK | Freq: Every day | ORAL | Status: DC
  Administered 2021-05-14 – 2021-05-27 (×14): 17 g via ORAL
  Filled 2021-05-13 (×14): qty 1

## 2021-05-13 MED ORDER — TRAZODONE HCL 50 MG PO TABS
75.0000 mg | ORAL_TABLET | Freq: Every day | ORAL | Status: DC
  Administered 2021-05-13: 75 mg via ORAL
  Filled 2021-05-13: qty 2

## 2021-05-13 MED ORDER — METHYLPHENIDATE HCL 5 MG PO TABS
5.0000 mg | ORAL_TABLET | Freq: Two times a day (BID) | ORAL | Status: DC
  Administered 2021-05-13 – 2021-05-27 (×28): 5 mg via ORAL
  Filled 2021-05-13 (×28): qty 1

## 2021-05-13 MED ORDER — SENNOSIDES-DOCUSATE SODIUM 8.6-50 MG PO TABS
2.0000 | ORAL_TABLET | Freq: Two times a day (BID) | ORAL | Status: DC
  Administered 2021-05-13 – 2021-05-27 (×29): 2 via ORAL
  Filled 2021-05-13 (×30): qty 2

## 2021-05-13 NOTE — Progress Notes (Signed)
Patient ID: Carolyn Russo, female   DOB: [birth year], 70 y.o.   MRN: [id number] S:Feels well, but still having issues with sleep. O:BP 93/74 (BP Location: Right Arm)   Pulse 78   Temp 98 F (36.7 C) (Oral)   Resp 18   Ht 5\' 2"  (1.575 m)   Wt 82.1 kg   SpO2 100%   BMI 33.10 kg/m  No intake or output data in the 24 hours ending 05/13/21 1047 Intake/Output: I/O last 3 completed shifts: In: 360 [P.O.:360] Out: -   Intake/Output this shift:  No intake/output data recorded. Weight change: -0.2 kg Gen:NAD sitting in wheelchair CVS:no rub Resp: CTA Abd: +BS, soft, NT/Nd Ext: no edema, RUE AVF +T/B  Recent Labs  Lab 05/07/21 0729  NA 135  K 4.2  CL 99  CO2 27  GLUCOSE 156*  BUN 22  CREATININE 6.99*  ALBUMIN 3.0*  CALCIUM 8.8*  PHOS 4.8*   Liver Function Tests: Recent Labs  Lab 05/07/21 0729  ALBUMIN 3.0*   No results for input(s): LIPASE, AMYLASE in the last 168 hours. No results for input(s): AMMONIA in the last 168 hours. CBC: Recent Labs  Lab 05/07/21 0729  WBC 12.1*  HGB 10.6*  HCT 31.2*  MCV 87.4  PLT 370   Cardiac Enzymes: No results for input(s): CKTOTAL, CKMB, CKMBINDEX, TROPONINI in the last 168 hours. CBG: Recent Labs  Lab 05/12/21 0622 05/12/21 1158 05/12/21 1658 05/12/21 2045 05/13/21 0642  GLUCAP 186* 359* 241* 297* 173*    Iron Studies: No results for input(s): IRON, TIBC, TRANSFERRIN, FERRITIN in the last 72 hours. Studies/Results: DG Abd 1 View  Result Date: 05/12/2021 CLINICAL DATA:  Acute left upper quadrant pain. Left upper quadrant pain when taking deep breath and rolls and bed. EXAM: ABDOMEN - 1 VIEW COMPARISON:  None. FINDINGS: Divided supine views of the abdomen obtained. No evidence of free intra-abdominal air. No bowel dilatation to suggest obstruction. Moderate volume of stool throughout the entire colon. No abnormal rectal distention. No visceral thigh assess or radiopaque calculi, detailed assessment limited by habitus.  Scoliosis and degenerative change in the spine. IMPRESSION: Nonobstructive bowel gas pattern with moderate volume of colonic stool. Electronically Signed   By: Keith Rake M.D.   On: 05/12/2021 15:10    (feeding supplement) PROSource Plus  30 mL Oral BID BM   aspirin EC  81 mg Oral Daily   B-complex with vitamin C  1 tablet Oral Daily   carvedilol  3.125 mg Oral BID WC   Chlorhexidine Gluconate Cloth  6 each Topical BID   cholecalciferol  2,000 Units Oral Daily   clopidogrel  75 mg Oral Daily   [START ON 05/14/2021] darbepoetin (ARANESP) injection - DIALYSIS  100 mcg Intravenous Q Fri-HD   heparin  5,000 Units Subcutaneous Q8H   insulin aspart  0-6 Units Subcutaneous TID WC   insulin glargine  8 Units Subcutaneous Daily   levETIRAcetam  500 mg Oral QHS   melatonin  3 mg Oral QHS   methylphenidate  5 mg Oral BID WC   mometasone-formoterol  2 puff Inhalation BID   pantoprazole  40 mg Oral Daily   [START ON 05/14/2021] polyethylene glycol  17 g Oral Daily   rosuvastatin  20 mg Oral Daily   senna-docusate  2 tablet Oral BID   sevelamer carbonate  800 mg Oral TID WC   traZODone  75 mg Oral QHS    BMET    Component Value Date/Time  NA 135 05/07/2021 0729   NA 145 (H) 02/17/2021 1146   K 4.2 05/07/2021 0729   CL 99 05/07/2021 0729   CO2 27 05/07/2021 0729   GLUCOSE 156 (H) 05/07/2021 0729   BUN 22 05/07/2021 0729   BUN 52 (H) 02/17/2021 1146   CREATININE 6.99 (H) 05/07/2021 0729   CREATININE 0.84 10/13/2011 0903   CALCIUM 8.8 (L) 05/07/2021 0729   GFRNONAA 6 (L) 05/07/2021 0729   GFRNONAA 75 10/13/2011 0903   GFRAA 17 06/19/2020 0000   GFRAA 86 10/13/2011 0903   CBC    Component Value Date/Time   WBC 12.1 (H) 05/07/2021 0729   RBC 3.57 (L) 05/07/2021 0729   HGB 10.6 (L) 05/07/2021 0729   HGB 8.0 (L) 02/17/2021 1146   HCT 31.2 (L) 05/07/2021 0729   HCT 24.0 (L) 02/17/2021 1146   PLT 370 05/07/2021 0729   PLT 376 02/17/2021 1146   MCV 87.4 05/07/2021 0729   MCV 85  02/17/2021 1146   MCH 29.7 05/07/2021 0729   MCHC 34.0 05/07/2021 0729   RDW 16.0 (H) 05/07/2021 0729   RDW 13.4 02/17/2021 1146   LYMPHSABS 3.5 05/03/2021 1044   MONOABS 1.1 (H) 05/03/2021 1044   EOSABS 0.2 05/03/2021 1044   BASOSABS 0.1 05/03/2021 1044    Dialysis Orders: MWF @ [facility] 4 hrs 180NRe 400/Autoflow 1.5 86.5 kg 2.0K/2.0 Ca TDC, heparin 2000 units bolus - Mircera 100 mcg IV q 2 weeks (last dose 04/14/2021 - Hectorol 5 mcg IV TIW   Assessment/Plan: 1. Acute R subcortical CVA: Back in CIR as of 7/15, transiently back to [facility] 7/14-7/15 with recurrent stroke like symptoms, likely watershed v. hypotensive issue per neuro. 2. Acute LGIB: Hx hemorrhoidal bleeding in past - s/p 2U PRBCs 6/26. S/p EGD/flex sig without clear bleeding source. Plavix and ASA held, now resumed. 3. ESRD: Off of her usual MWF schedule due to high HD census - Plan for HD today and again on Saturday (or tomorrow depending upon staffing) and get back to schedule on Monday.  No added heparin. 4. Dialysis Access: S/p 1st stage BVT 7/8 by Dr. Scot Dock - mild steal symptoms, monitoring for now. 5. HTN/volume: Variable BP - trying to avoid intradialytic hypotension, she is well below OP dry weight. 6. Anemia: Hgb 10.6 - continue Aranesp 169mcg q Friday. 7. Secondary hyperparathyroidism: Hectoral on hold for low PTH x 2. Binder changed from Renvela pills to powder d/t issues swallowing pills. Ca/Phos ok for now. 8. Nutrition: Alb low, adding supplements. 9. T2DM: Per primary. 10. Insomnia: Per notes, has been awake at night and sleeping during day - primary trialing ritalin during day and melatonin at night. Watch BP with stimulants.  Donetta Potts, MD [employer] [phone]

## 2021-05-13 NOTE — Progress Notes (Signed)
Occupational Therapy Weekly Progress Note  Patient Details  Name: Carolyn Russo MRN: [id number] Date of Birth: 1950-11-30  Beginning of progress report period: May 08, 2021 End of progress report period: May 13, 2021   Patient has met 2 of 4 short term goals.  Pt is progressing with OT goals and has been more alert in recent sessions. Pt primarily limited by use of her R hand d/t decreased GM/FMC. Pt bathing at sink level with Min/CGA for UB with hand over hand at times for R hand use and Min/Mod for LB bathing at sit <> stand level. Note pt does have good ROM and able to reach buttocks and periarea but is very self limiting at times asking for assist with bathing, dressing, toileting, feeding, etc. And encouraged to attempt tasks to promote independence. Pt's husband has been present for several sessions as well. ADL transfers with Min/CGA with RW.   Patient continues to demonstrate the following deficits: muscle weakness, decreased cardiorespiratoy endurance, impaired timing and sequencing, unbalanced muscle activation, decreased coordination, and decreased motor planning, decreased motor planning, decreased initiation, decreased attention, decreased awareness, decreased problem solving, decreased safety awareness, and delayed processing, and decreased sitting balance, decreased standing balance, decreased postural control, hemiplegia, and decreased balance strategies and therefore will continue to benefit from skilled OT intervention to enhance overall performance with BADL and Reduce care partner burden.  Patient progressing toward long term goals..  Continue plan of care.  OT Short Term Goals Week 1:  OT Short Term Goal 1 (Week 1): Pt will complete 2/3 steps of toileting with mod A. OT Short Term Goal 1 - Progress (Week 1): Met OT Short Term Goal 2 (Week 1): Pt will complete standing ADL with min A. OT Short Term Goal 2 - Progress (Week 1): Progressing toward goal OT Short Term Goal 3  (Week 1): Pt will don shirt with MIN A OT Short Term Goal 3 - Progress (Week 1): Progressing toward goal OT Short Term Goal 4 (Week 1): Pt will recall hemi dressing strategies with min VC OT Short Term Goal 4 - Progress (Week 1): Met Week 2:  OT Short Term Goal 1 (Week 2): Pt will complete 2/3 toileting tasks with no more than Min A OT Short Term Goal 2 (Week 2): Pt will perform LB dress with Mod A at sit <> stand level OT Short Term Goal 3 (Week 2): Pt will use R hand to self feed with AE PRN with Supervision OT Short Term Goal 4 (Week 2): Pt will consistently perform ADL transfers with CGA and LRAD    Therapy Documentation Precautions:  Precautions Precautions: Fall, Other (comment) Precaution Comments: L inattention, L hemiparesis, seizure protocol, impaired R UE coordination, R port a cath Restrictions Weight Bearing Restrictions: No    Therapy/Group: Individual Therapy  [name] 05/13/2021, 7:19 AM

## 2021-05-13 NOTE — Progress Notes (Signed)
Occupational Therapy Session Note  Patient Details  Name: Carolyn Russo MRN: [id number] Date of Birth: 16-Sep-1951  Today's Date: 05/13/2021 OT Individual Time: 1130-1155 OT Individual Time Calculation (min): 25 min    Short Term Goals: Week 2:  OT Short Term Goal 1 (Week 2): Pt will complete 2/3 toileting tasks with no more than Min A OT Short Term Goal 2 (Week 2): Pt will perform LB dress with Mod A at sit <> stand level OT Short Term Goal 3 (Week 2): Pt will use R hand to self feed with AE PRN with Supervision OT Short Term Goal 4 (Week 2): Pt will consistently perform ADL transfers with CGA and LRAD  Skilled Therapeutic Interventions/Progress Updates:    Pt sitting in w/c upon arrival reading her Bible. On questioning, pt recalled morning therapy session with OTR. Pt recalled previous days therapy session as well. Pt inquired when she would be going to HD this afternoon. Pt engaged in [place] activities. Pt continues to exhibit limited LUE shoulder AROM. PROM WFL with no s/s of pain. Pt remained seated in w/c with all needs within reach and belt alarm activated.   Therapy Documentation Precautions:  Precautions Precautions: Fall, Other (comment) Precaution Comments: L inattention, L hemiparesis, seizure protocol, impaired R UE coordination, R port a cath Restrictions Weight Bearing Restrictions: No   Pain: Pain Assessment Pain Scale: 0-10 Pain Score: 0-No pain Faces Pain Scale: No hurt    Therapy/Group: Individual Therapy  Leroy Libman 05/13/2021, 12:05 PM

## 2021-05-13 NOTE — Progress Notes (Signed)
Unable to give enema due to pt in dialysis, No BM for pt today, pt arrived back from dialysis has eaten dinner. Report given to oncoming nurse for enema to be given.   Dayna Ramus

## 2021-05-13 NOTE — Progress Notes (Signed)
Occupational Therapy Session Note  Patient Details  Name: Carolyn Russo MRN: [id number] Date of Birth: 06-Feb-1951  Today's Date: 05/13/2021 OT Individual Time: 2703-5009 OT Individual Time Calculation (min): 55 min    Short Term Goals: Week 1:  OT Short Term Goal 1 (Week 1): Pt will complete 2/3 steps of toileting with mod A. OT Short Term Goal 2 (Week 1): Pt will complete standing ADL with min A. OT Short Term Goal 3 (Week 1): Pt will don shirt with MIN A OT Short Term Goal 4 (Week 1): Pt will recall hemi dressing strategies with min VC   Skilled Therapeutic Interventions/Progress Updates:    Pt greeted at time of session semireclined in bed with NT providing Supervision for breakfast, OT to resume care. RN providing med pass at beginning of session. Self feeding for breakfast at beginning of session needing Supervision - Min A throughout using build up handles on spoon and fork, cues and demonstration for proper form to hold utensils as well as decreasing compensatory movements at shoulder for self feeding, encouraging more elbow flexion. Encouragement required throughout for pt to perform tasks instead of having therapist, pt self limiting, frequently asking "can you just do it?" With therapist encouragement to try herself first. Making phone call to husband, hand over hand with R hand to dial numbers on phone. Set up in wheelchair at sink for face washing Supervision, cues to problem solve warm/hot water, donned button up shirt Max A. Donned pants Max A with extended time for all tasks and for rest breaks. Set up with alarm on call bell in reach.   Therapy Documentation Precautions:  Precautions Precautions: Fall, Other (comment) Precaution Comments: L inattention, L hemiparesis, seizure protocol, impaired R UE coordination, R port a cath Restrictions Weight Bearing Restrictions: No      Therapy/Group: Individual Therapy  [name] 05/13/2021, 7:18 AM

## 2021-05-13 NOTE — Progress Notes (Signed)
Physical Therapy Session Note  Patient Details  Name: FELESHIA ZUNDEL MRN: [id number] Date of Birth: [birth year]  Today's Date: 05/13/2021 PT Missed Time: 45 Minutes Missed Time Reason: Unavailable (Comment) (Pt at HD appointment)  Short Term Goals: Week 1:  PT Short Term Goal 1 (Week 1): Pt will perform supine<>sit with min assist PT Short Term Goal 2 (Week 1): Pt will perform sit<>stands using LRAD with CGA PT Short Term Goal 3 (Week 1): Pt will perform bed<>chair transfers using LRAD with CGA PT Short Term Goal 4 (Week 1): Pt will ambulate at least 29ft using LRAD with min assist PT Short Term Goal 5 (Week 1): Pt will ascend/descend 4 steps using B HRs with min assist   Skilled Therapeutic Interventions/Progress Updates:  Attempted to see patient for scheduled therapy session. Patient off unit for HD, missed 45 min of scheduled skilled therapy services. Will follow up per POC.  Therapy Documentation Precautions:  Precautions Precautions: Fall, Other (comment) Precaution Comments: L inattention, L hemiparesis, seizure protocol, impaired R UE coordination, R port a cath Restrictions Weight Bearing Restrictions: No  Therapy/Group: Individual Therapy  Alger Simons 05/13/2021, 2:15 PM

## 2021-05-13 NOTE — Progress Notes (Signed)
PROGRESS NOTE     Subjective/Complaints:  Said didn't have BM; and didn't get KUB- did get KUB, but didn't poop.  Said walking with therapy.   Had sorbitol 60cc and no BM in spite of that.   "Tinkled a lot".  Missed Hemodialysis yesterday? Not clear by chart if had HD yesterday.   No BM in 4+ days.    ROS:  Pt denies SOB, abd pain, CP, N/V and vision changes Voiding a lot per pt.   Objective:       Physical Exam: Vital Signs    Vitals with BMI 05/13/2021 05/12/2021 05/12/2021  Height - - -  Weight 181 lbs - -  BMI 76.1 - -  Systolic 93 - 950  Diastolic 74 - 81  Pulse 78 78 78               General: awake, alert, laying in bed; kept eyes closed, but talking appropriately; NAD HENT: conjugate gaze; oropharynx moist CV: regular rate; no JVD Pulmonary: CTA B/L; no W/R/R- good air movement GI: soft, NT, ND, (+)BS Psychiatric: delayed, but appropriate, asking good questions Neurological: more alert, even then eyes closed a lot.    Musculoskeletal: R hand/forearm cooler than L side- but not COLD-    no change today    General: No swelling.    Cervical back: Normal range of motion.    Comments: Mild tenderness left shoulder with ER/IR today  Skin:    General: Skin is warm and dry. Neurological:    Comments: alert, follows commands.   Fair insight and awareness. Provided biographical information. Normal language, speech slightly slurred. Mild left central 7. LUE 2 to 2+/5 deltoid, 3/5 biceps, triceps and 3+/5 wrist and HI. LLE 3-4/5 prox to distal. RUE and RLE 4-5/5 prox to distal. No sensory deficits noted--motor/sensory exam stable today.  also cannot do finger touching to each finger/thumb- not able to do any dexterity movements, or hold pen.   NO change on dexterity this AM   Assessment/Plan: 1. Functional deficits which require 3+ hours per day of interdisciplinary therapy in a comprehensive inpatient  rehab setting. Physiatrist is providing close team supervision and 24 hour management of active medical problems listed below. Physiatrist and rehab team continue to assess barriers to discharge/monitor patient progress toward functional and medical goals   Care Tool:   Bathing   Body parts bathed by patient: Right arm, Left arm, Chest, Abdomen, Front perineal area, Right upper leg, Left upper leg    Body parts bathed by helper: Right lower leg, Left lower leg, Buttocks    Bathing assist Assist Level: Minimal Assistance - Patient > 75%    Upper Body Dressing/Undressing Upper body dressing What is the patient wearing?: Pull over shirt   Upper body assist Assist Level: Minimal Assistance - Patient > 75%   Lower Body Dressing/Undressing Lower body dressing       What is the patient wearing?: Pants, Incontinence brief        Lower body assist Assist for lower body dressing: Minimal Assistance - Patient > 75%     Toileting Toileting   Toileting assist Assist for toileting: Moderate Assistance - Patient  50 - 74%    Transfers Chair/bed transfer   Transfers assist     Chair/bed transfer assist level: Minimal Assistance - Patient > 75%    Locomotion Ambulation     Ambulation assist       Assist level: Minimal Assistance - Patient > 75% Assistive device: Walker-rolling Max distance: 50'    Walk 10 feet activity     Assist     Assist level: Minimal Assistance - Patient > 75% Assistive device: Walker-rolling    Walk 50 feet activity     Assist Walk 50 feet with 2 turns activity did not occur: Safety/medical concerns   Assist level: Minimal Assistance - Patient > 75% Assistive device: Walker-rolling      Walk 150 feet activity     Assist Walk 150 feet activity did not occur: Safety/medical concerns        Walk 10 feet on uneven surface activity     Assist Walk 10 feet on uneven surfaces activity did not occur: Safety/medical concerns          Wheelchair         Assist Will patient use wheelchair at discharge?:  (TBD) Type of Wheelchair: Manual   Wheelchair assist level: Dependent - Patient 0% Max wheelchair distance: 150'      Wheelchair 50 feet with 2 turns activity       Assist           Assist Level: Dependent - Patient 0%    Wheelchair 150 feet activity       Assist       Assist Level: Dependent - Patient 0%     Medical Problem List and Plan: 1.  Left side weakness secondary to nonhemorrhagic right basal ganglia infarction             -patient may shower Reviewed noted 2 day interrupted stay 7/13--7/15 for altered MS , no new CVA identified             con't OT, OT and SLP- will add Ritalin for lethargy and initiation- however due to BP being on high side, will do 2.5 mg with breakfast and lunch. Con't PT, OT and SLP 2.  Impaired mobility: -DVT/anticoagulation:  continue SCD             -antiplatelet therapy:ASA             Plavix restarted. 3. Right hip bursitis: continue Tramadol 50 mg every 12 hours as needed, ice, ROM, lidocaine patch 4. Anxiety/stroke poor initiation: continue Xanax 0.5 mg 3 times daily as needed.  Provide emotional support             7/12- per husband, concerned about flat affect- will d/w possible depression with pt- if so, will start SSRI.              7/13- will wait for now per husband  7/16- will d/w husband when sees next?  7/17- adding ritalin for initiation/sedation during day.   7/19- will increase Ritalin if OK with renal tomorrow and move AM dose to earlier.   7/20- BP looks OK- will increase to 5 mg BID  7/21- changed AM dose to 0630 AM             -antipsychotic agents: N/A 5. Neuropsych: This patient is ?capable of making decisions on her own behalf. 6. Skin/Wound Care: Routine skin checks 7. Fluids/Electrolytes/Nutrition: Routine in and outs with follow-up chemistries 8.  Acute lower GI bleed with  blood loss anemia.  Follow-up GI services.  Resume ASA as  above. - Continue Protonix 40 mg twice daily 7/6: hgb 9.6--monitor for gross bleeding 7/11- last Hb stable at 9.8- will recheck this week             7/12- Hb came back at 9.7- stable- con't regimen             7/13- Hb up to 11.1- will con't to monitor  7/17- Hb 10.6- con't regimen On Aranesp q Friday for anemia or chronic disease due to ESRD 9.  Chronic diastolic congestive heart failure.  Monitor for any signs of fluid overload.  Continue Lasix 40-80 mg Monday Wednesday Friday             -daily weights, have been stable             7/12- weight bounced up 1 kg today, however was this level 2-3 days ago, so think yesterday's weight might have been slightly off- will monitor  7/16- weight much better- con't to monitor  7/20- weight doing well- stable- con't to monitor 10.  Newly Dx'd End-stage renal disease.  Hemodialysis as per renal services             7/12- questionable steal syndrome since Fistula placement- will monitor per Vascular- to see if  needs additional intervention  7/19- R hand still cooler to touch- also has decreased dexterity- will likely need EMG after d/c.   7/21- not clear if had HD yesterday- don't see documentation of it? 11.  Hypertension.  Increase Coreg to 12.5 mg twice daily. Aggressive filtration with ultradialysis tomorrow             7/11- HD today- BP 170s/90s this AM- per renal.             7/12- BP dropped to 70s/50s with HD yesterday- back up to 150s/80s today- con't reigmen  7/17- BP 150s/160s- per renal- will only start 2.5 mg of Ritalin due to BP issues- will monitor closely.  Vitals:   05/12/21 2124 05/13/21 0458  BP:  93/74  Pulse: 78 78  Resp: 18 18  Temp:  98 F (36.7 C)  SpO2: 100% 100%   P7/19- BP doing stable since starting Ritalin- per HD.   7/20- BP looked good this AM- this afternoon, up to 181/79- will d/w renal. 7/21- BP better yesterday evening and actually soft this Am 93/74- will monitor closely.   12.  Asthma.  Continue inhalers  as directed             - was getting Home O2 prior to admission- will likely need to go home on 2L O2-   Was trying to get at home, but hadn't yet.             7/1- con't nebs prn, home O2 ?PRN only             7/3 lungs very clear today, sats 99% on RA. Some of SOB may be behaviorally related. Reassured pt today that lungs sounded fine.                         -encouraged FV/IS, OOB             7/7: ordered CXT-stable, ordered incentive spirometer 13.Hyperlipidemia.Crestor 14.Obesity.BMI 34.31.Dietery follow up 15.  Diabetes mellitus.hemoglobin AIc 5.2.  CBGs 211-310. Increase Lantus to 13U BID.  7/17- BG's don't look as good- >200- will  follow trend before making change yet.   7/20- increased Lantus to 8 units daily- will monitor- increased from 5 units.   7/21- increased yesterday- will give 1 day and then change again after see trend.   16. Leukocytosis: trending down, repeat Monday             7/11- last WBC was 13k- will recheck in HD today.              7/13- WBC down to 10.1- will monitor  7/16- Last WBC 12.1- was 12.8 last week- bounced down and went back up again?- con't to monitor, labs in HD  7/21- HD yesterday?; will see if can get labs via HD? 17. Constipation: KUB shows moderate stool burden. Increase senna-docusate to BID.              7/13- LBM yesterday- con't regimen  7/19- LBM 2-3 days ago- will give a dose of Sorbitol at lunch time. 7/20- will give another dose of Sorbitol since cannot give Mg- early this AM- so might go before HD- KUB shows moderate volume of stool throughout colon- could be cause of LUQ pain.   7/21- will give 2 doses of miralax and increase senokot to 2 tabs BID and add Miralax daily and give soap suds enema this afternoon.   18. Fistula placement: successful, new fistula accessed without complications.  7/20- possible steal syndrome of RUE- cooler hand on R- might need EMG after d/c.  19. Left shoulder pain: ordered lidocaine patch and voltaren  gel. 20. Lethargy             7/12- is much improved today- will con't to monitor- CT and CXR of chest were stable             7/16- will readd Melatonin for sleep- never had the chance to use it.   7/17- didn't sleep at all- hopefully Ritalin due day 2.5 mg BID- will help- cannot use Amantadine secondary to HD.   7/19- will try to speak with renal to see if can increase Ritalin to 5 mg BID- give AM dose earlier.   7/20- will increase Ritalin to 5 mg BID- 7/21- will change to 0630 AM for AM dose.   21. Insomnia/mixing of sleep wake cycle  7/20- will try Trazodone 50 mg QHS and see if can get back on good sleep cycle? Encouraged pt/staff to try and keep her awake more during day.   7/21- didn't sleep more than 1 hour overnight- will increase trazodone to 75 mg QHS. More awake this AM

## 2021-05-13 NOTE — Progress Notes (Signed)
Occupational Therapy Note  Patient Details  Name: KAMARIYA BLEVENS MRN: [id number] Date of Birth: [birth year]  Today's Date: 05/13/2021 OT Missed Time: 7 Minutes Missed Time Reason: Unavailable (comment) (HD)  Pt missed 60 mins skilled OT services. Off unit for HD.   Leotis Shames [facility] 05/13/2021, 1:36 PM

## 2021-05-13 NOTE — Progress Notes (Signed)
Speech Language Pathology Daily Session Note  Patient Details  Name: Carolyn Russo MRN: [id number] Date of Birth: 24-Nov-1950  Today's Date: 05/13/2021 SLP Individual Time: 1025-1105 SLP Individual Time Calculation (min): 40 min  Short Term Goals: Week 1: SLP Short Term Goal 1 (Week 1): Pt will utilize overarticulation to achieve intelligibility at the conversational level in mildly distracting environments with supervision verbal cues. SLP Short Term Goal 2 (Week 1): Pt will consume therapeutic trials of regular textures with supervision cues for use of swallowing precautions over 3 consecutive sessions prior to diet advancement. SLP Short Term Goal 3 (Week 1): Pt will use external aids to recall daily information with mod assist verbal cues. SLP Short Term Goal 4 (Week 1): Pt will complete basic tasks with mod assist verbal cues for functional problem solving. SLP Short Term Goal 5 (Week 1): Pt will sustain her attention to functional tasks for 10 minute intervals with min assist verbal cues for redirection.  Skilled Therapeutic Interventions:  Patient seen for skilled ST session with focus on cognitive function goals. She was awake and alert, sitting up in wheelchair and did not have any difficulty maintaining alertness for entire session. She continues to have difficulty differentiating therapies, telling SLP she had "speech therapy" this morning though it was actually occupational therapy. She was oriented fully to time without requiring calendar. Patient expressed that she still feels the need to work on her speech as she used to be "highly articulate". SLP discussed and demonstrated using patient's bible to practice oral reading for improving speech clarity and patient able to return-demonstrate with cues for visual tracking between columns. Patient continues to benefit from skilled SLP intervention to maximize cognitive-linguistic, speech and swallow function prior to discharge.  Pain Pain  Assessment Pain Scale: 0-10 Pain Score: 0-No pain  Therapy/Group: Individual Therapy  Sonia Baller, MA, CCC-SLP Speech Therapy

## 2021-05-13 NOTE — Telephone Encounter (Signed)
HIM received the form back from the nurse incomplete. HIM called the patient and left a message informing her of the Form that Dr. Tamala Julian has declined to complete and also to inform about obtaining the refund due in which the patient will need to come into the office to pick up. AO 05/13/21

## 2021-05-14 LAB — CBC
HCT: 27.7 % — ABNORMAL LOW (ref 36.0–46.0)
Hemoglobin: 9.3 g/dL — ABNORMAL LOW (ref 12.0–15.0)
MCH: 29.3 pg (ref 26.0–34.0)
MCHC: 33.6 g/dL (ref 30.0–36.0)
MCV: 87.4 fL (ref 80.0–100.0)
Platelets: 290 10*3/uL (ref 150–400)
RBC: 3.17 MIL/uL — ABNORMAL LOW (ref 3.87–5.11)
RDW: 15.8 % — ABNORMAL HIGH (ref 11.5–15.5)
WBC: 12.1 10*3/uL — ABNORMAL HIGH (ref 4.0–10.5)
nRBC: 0 % (ref 0.0–0.2)

## 2021-05-14 LAB — GLUCOSE, CAPILLARY
Glucose-Capillary: 140 mg/dL — ABNORMAL HIGH (ref 70–99)
Glucose-Capillary: 240 mg/dL — ABNORMAL HIGH (ref 70–99)
Glucose-Capillary: 217 mg/dL — ABNORMAL HIGH (ref 70–99)
Glucose-Capillary: 191 mg/dL — ABNORMAL HIGH (ref 70–99)

## 2021-05-14 MED ORDER — INSULIN GLARGINE 100 UNIT/ML ~~LOC~~ SOLN
10.0000 [IU] | Freq: Every day | SUBCUTANEOUS | Status: DC
  Administered 2021-05-15 – 2021-05-17 (×3): 10 [IU] via SUBCUTANEOUS
  Filled 2021-05-14 (×4): qty 0.1

## 2021-05-14 MED ORDER — TRAZODONE HCL 50 MG PO TABS
100.0000 mg | ORAL_TABLET | Freq: Every day | ORAL | Status: DC
  Administered 2021-05-14 – 2021-05-26 (×13): 100 mg via ORAL
  Filled 2021-05-14 (×13): qty 2

## 2021-05-14 MED ORDER — HYDRALAZINE HCL 10 MG PO TABS
10.0000 mg | ORAL_TABLET | Freq: Three times a day (TID) | ORAL | Status: DC | PRN
  Administered 2021-05-14: 10 mg via ORAL
  Filled 2021-05-14: qty 1

## 2021-05-14 MED ORDER — DARBEPOETIN ALFA 100 MCG/0.5ML IJ SOSY
100.0000 ug | PREFILLED_SYRINGE | INTRAMUSCULAR | Status: DC
  Administered 2021-05-15: 100 ug via INTRAVENOUS
  Filled 2021-05-14: qty 0.5

## 2021-05-14 NOTE — Progress Notes (Addendum)
[facility] Progress Note   Subjective:  Seen in room - tired, but denies CP or dyspnea. Dialyzed yesterday (off schedule) - net UF ~869mL.  Objective Vitals:   05/13/21 1640 05/13/21 1930 05/13/21 2033 05/14/21 0535  BP: (!) 159/79 (!) 185/80  (!) 132/58  Pulse: 76 77  80  Resp: 16 18  17   Temp: 98.3 F (36.8 C) 97.9 F (36.6 C)  98.2 F (36.8 C)  TempSrc: Oral     SpO2:  100% 100% 100%  Weight:    80.8 kg  Height:       Physical Exam General: Frail woman, NAD. In chair. Heart: RRR; 2/6 murmur Lungs: CTAB Abdomen: soft Extremities: No LE edema Dialysis Access: RUE AVF + thrill  Additional Objective Labs: CBC: Recent Labs  Lab 05/14/21 0454  WBC 12.1*  HGB 9.3*  HCT 27.7*  MCV 87.4  PLT 290   CBG: Recent Labs  Lab 05/13/21 0642 05/13/21 1136 05/13/21 1845 05/13/21 2111 05/14/21 0620  GLUCAP 173* 251* 139* 239* 140*   Studies/Results: DG Abd 1 View  Result Date: 05/12/2021 CLINICAL DATA:  Acute left upper quadrant pain. Left upper quadrant pain when taking deep breath and rolls and bed. EXAM: ABDOMEN - 1 VIEW COMPARISON:  None. FINDINGS: Divided supine views of the abdomen obtained. No evidence of free intra-abdominal air. No bowel dilatation to suggest obstruction. Moderate volume of stool throughout the entire colon. No abnormal rectal distention. No visceral thigh assess or radiopaque calculi, detailed assessment limited by habitus. Scoliosis and degenerative change in the spine. IMPRESSION: Nonobstructive bowel gas pattern with moderate volume of colonic stool. Electronically Signed   By: Keith Rake M.D.   On: 05/12/2021 15:10    Medications:   (feeding supplement) PROSource Plus  30 mL Oral BID BM   aspirin EC  81 mg Oral Daily   B-complex with vitamin C  1 tablet Oral Daily   carvedilol  3.125 mg Oral BID WC   Chlorhexidine Gluconate Cloth  6 each Topical BID   cholecalciferol  2,000 Units Oral Daily   clopidogrel  75 mg Oral  Daily   darbepoetin (ARANESP) injection - DIALYSIS  100 mcg Intravenous Q Fri-HD   heparin  5,000 Units Subcutaneous Q8H   insulin aspart  0-6 Units Subcutaneous TID WC   [START ON 05/15/2021] insulin glargine  10 Units Subcutaneous Daily   levETIRAcetam  500 mg Oral QHS   melatonin  3 mg Oral QHS   methylphenidate  5 mg Oral BID WC   mometasone-formoterol  2 puff Inhalation BID   pantoprazole  40 mg Oral Daily   polyethylene glycol  17 g Oral Daily   rosuvastatin  20 mg Oral Daily   senna-docusate  2 tablet Oral BID   sevelamer carbonate  800 mg Oral TID WC   traZODone  100 mg Oral QHS    Dialysis Orders: MWF @ [facility] 4 hrs 180NRe 400/Autoflow 1.5 86.5 kg 2.0K/2.0 Ca TDC, heparin 2000 units bolus - Mircera 100 mcg IV q 2 weeks (last dose 04/14/2021 - Hectorol 5 mcg IV TIW   Assessment/Plan: 1. Acute R subcortical CVA: Back in CIR as of 7/15, transiently back to [facility] 7/14-7/15 with recurrent stroke like symptoms, likely watershed v. hypotensive issue per neuro. 2. Acute LGIB: Hx hemorrhoidal bleeding in past - s/p 2U PRBCs 6/26. S/p EGD/flex sig without clear bleeding source. Plavix and ASA held, now resumed. 3. ESRD: Off of her usual MWF schedule due to high  HD census. Last HD was 7/21. For HD tomorrow, then back to schedule on Monday.  No added heparin. 4. Dialysis Access: S/p 1st stage BVT 7/8 by Dr. Scot Dock - mild steal symptoms, monitoring for now. 5. HTN/volume: Variable BP - trying to avoid intradialytic hypotension, she is well below OP dry weight. 6. Anemia: Hgb 9.3 - continue Aranesp 110mcg q Friday. 7. Secondary hyperparathyroidism: Hectoral on hold for low PTH x 2. Binder changed from Renvela pills to powder d/t issues swallowing pills. Ca/Phos ok for now. 8. Nutrition: Alb low, continue supplements. 9. T2DM: Per primary. 10. Insomnia: Per notes, has been awake at night and sleeping during day - primary trialing ritalin during day and melatonin at night. Watch BP with  stimulants.  Carolyn Penton, PA-C 05/14/2021, 10:09 AM  [facility]  I have seen and examined this patient and agree with plan and assessment in the above note with renal recommendations/intervention highlighted. Very somnolent this morning.  Continues to have sleeping issues.  Carolyn John A Sage Hammill,MD 05/14/2021 11:03 AM

## 2021-05-14 NOTE — Progress Notes (Signed)
Physical Therapy Session Note  Patient Details  Name: Carolyn Russo MRN: [id number] Date of Birth: May 15, 1951  Today's Date: 05/14/2021 PT Individual Time: 0800-0900 PT Individual Time Calculation (min): 60 min   Short Term Goals: Week 1:  PT Short Term Goal 1 (Week 1): Pt will perform supine<>sit with min assist PT Short Term Goal 2 (Week 1): Pt will perform sit<>stands using LRAD with CGA PT Short Term Goal 3 (Week 1): Pt will perform bed<>chair transfers using LRAD with CGA PT Short Term Goal 4 (Week 1): Pt will ambulate at least 91ft using LRAD with min assist PT Short Term Goal 5 (Week 1): Pt will ascend/descend 4 steps using B HRs with min assist  Skilled Therapeutic Interventions/Progress Updates:    pt received in bed and agreeable to therapy. No complaint of pain. Supine>sit with supervision, when asked to stand pt reported she couldn't because "my back is itchy." Pt eventually agreeable to stand and walk to bathroom. ambulatory transfer with RW and min A. Pt required min A and extended time for 3/3 toileting tasks. Pt ambulated to w/c and sat to perform hand hygiene, oral care, and wash face with occ hand over hand assistance and extended time. Cues for sequencing required at times. Therapist set up pt with breakfast and gave hand over hand assistance to put sugar and milk in cereal, handed off to NT for supervision of meal at end of session.   Therapy Documentation Precautions:  Precautions Precautions: Fall, Other (comment) Precaution Comments: L inattention, L hemiparesis, seizure protocol, impaired R UE coordination, R port a cath Restrictions Weight Bearing Restrictions: No     Therapy/Group: Individual Therapy  Mickel Fuchs 05/14/2021, 5:09 PM

## 2021-05-14 NOTE — Progress Notes (Signed)
Carolyn Russo is a 70 yo female ESRD and a recent h/o CVA 03/2021, who underwent surgery for a right arm AVF creation on 04/30/21.  She complains of weakness in her right arm that she first noticed immediately after the surgery.  Her symptoms include a sensation of "having a glove on my hand".  The glove she describes terminates at the wrist.  She states that she is unable to adequately grasp her walker due to lack of strength.  She says there is a cold sensation that encompasses her entire right arm.  EXAM Pt has slurred speech and facial droop Neuro: (right arm) Shoulder adduction: 5/5 Shoulder abduction: 5/5 Elbow flexion: 5/5 Elbow extension: 5/5 Grip: 3/5 Wrist extension: 3/5 Wrist flexion: 3/5  In general, the right and left arms are normal appearing and symmetrical.   There is a well healing surgical incision on the right arm near the elbow.  A: Pt has right sided grip and wrist weakness along with altered sensation in the same hand.  Anesthesia for the surgery on 7/8 was provided by a supraclavicular brachial plexus nerve block.  The record shows that the block was performed without difficulty.  Block drugs were: 1.5% lidocaine with epinephrine and 2% Mepivacaine.  These are very short acting drugs with typical duration between 2-4 hours before wearing off.  It seems unlikely to be nerve injury from the nerve block injection as the distribution of symptoms are primarily distal to the elbow.  The typical area of effect of this nerve block would include areas of the upper arm.  P: Further evaluation by Neurology may be warranted.

## 2021-05-14 NOTE — Progress Notes (Signed)
PROGRESS NOTE     Subjective/Complaints:   Pt reports LUQ pain under L breast is resolved since had large BM last night- Had huge BM after enema.  Got HD late, she notes No other issues-   ROS:  Pt denies SOB, abd pain, CP, N/V/C/D, and vision changes   Objective:       Physical Exam: Vital Signs    Vitals with BMI 05/14/2021 05/13/2021 05/13/2021  Height - - -  Weight 178 lbs 2 oz - -  BMI 50.27 - -  Systolic [phone]  Diastolic 58 80 79  Pulse 80 77 76                General: awake, alert, appropriate, kept closing eyes, but actually more interactive;  NAD HENT: conjugate gaze; oropharynx moist, smiled a couple of times CV: regular rate; no JVD Pulmonary: CTA B/L; no W/R/R- good air movement GI: soft, NT, ND, (+)BS Psychiatric: appropriate; more interactive and more initiation this AM Neurological: more alert  Musculoskeletal: R hand/forearm cooler than L side- but not COLD-    no change today    General: No swelling.    Cervical back: Normal range of motion.    Comments: Mild tenderness left shoulder with ER/IR today  Skin:    General: Skin is warm and dry. Neurological:    Comments: alert, follows commands.   Fair insight and awareness. Provided biographical information. Normal language, speech slightly slurred. Mild left central 7. LUE 2 to 2+/5 deltoid, 3/5 biceps, triceps and 3+/5 wrist and HI. LLE 3-4/5 prox to distal. RUE and RLE 4-5/5 prox to distal. No sensory deficits noted--motor/sensory exam stable today.  also cannot do finger touching to each finger/thumb- not able to do any dexterity movements, or hold pen.   NO change on dexterity this AM   Assessment/Plan: 1. Functional deficits which require 3+ hours per day of interdisciplinary therapy in a comprehensive inpatient rehab setting. Physiatrist is providing close team supervision and 24 hour management of active medical problems  listed below. Physiatrist and rehab team continue to assess barriers to discharge/monitor patient progress toward functional and medical goals   Care Tool:   Bathing   Body parts bathed by patient: Right arm, Left arm, Chest, Abdomen, Front perineal area, Right upper leg, Left upper leg    Body parts bathed by helper: Right lower leg, Left lower leg, Buttocks    Bathing assist Assist Level: Minimal Assistance - Patient > 75%    Upper Body Dressing/Undressing Upper body dressing What is the patient wearing?: Pull over shirt   Upper body assist Assist Level: Minimal Assistance - Patient > 75%   Lower Body Dressing/Undressing Lower body dressing       What is the patient wearing?: Pants, Incontinence brief        Lower body assist Assist for lower body dressing: Minimal Assistance - Patient > 75%     Toileting Toileting   Toileting assist Assist for toileting: Moderate Assistance - Patient 50 - 74%    Transfers Chair/bed transfer   Transfers assist     Chair/bed transfer assist level: Minimal Assistance - Patient > 75%  Locomotion Ambulation     Ambulation assist       Assist level: Minimal Assistance - Patient > 75% Assistive device: Walker-rolling Max distance: 50'    Walk 10 feet activity     Assist     Assist level: Minimal Assistance - Patient > 75% Assistive device: Walker-rolling    Walk 50 feet activity     Assist Walk 50 feet with 2 turns activity did not occur: Safety/medical concerns   Assist level: Minimal Assistance - Patient > 75% Assistive device: Walker-rolling      Walk 150 feet activity     Assist Walk 150 feet activity did not occur: Safety/medical concerns        Walk 10 feet on uneven surface activity     Assist Walk 10 feet on uneven surfaces activity did not occur: Safety/medical concerns         Wheelchair         Assist Will patient use wheelchair at discharge?:  (TBD) Type of Wheelchair: Manual    Wheelchair assist level: Dependent - Patient 0% Max wheelchair distance: 150'      Wheelchair 50 feet with 2 turns activity       Assist           Assist Level: Dependent - Patient 0%    Wheelchair 150 feet activity       Assist       Assist Level: Dependent - Patient 0%     Medical Problem List and Plan: 1.  Left side weakness secondary to nonhemorrhagic right basal ganglia infarction             -patient may shower Reviewed noted 2 day interrupted stay 7/13--7/15 for altered MS , no new CVA identified             con't OT, OT and SLP- will add Ritalin for lethargy and initiation- however due to BP being on high side, will do 2.5 mg with breakfast and lunch. Continue CIR- PT, OT and SLP  2.  Impaired mobility: -DVT/anticoagulation:  continue SCD             -antiplatelet therapy:ASA             Plavix restarted. 3. Right hip bursitis: continue Tramadol 50 mg every 12 hours as needed, ice, ROM, lidocaine patch 4. Anxiety/stroke poor initiation: continue Xanax 0.5 mg 3 times daily as needed.  Provide emotional support             7/12- per husband, concerned about flat affect- will d/w possible depression with pt- if so, will start SSRI.              7/13- will wait for now per husband  7/20- BP looks OK- will increase to 5 mg BID  7/22- more awake, alert this AM than usual- more initiation- con't regimen- BP controlled for her.              -antipsychotic agents: N/A 5. Neuropsych: This patient is ?capable of making decisions on her own behalf. 6. Skin/Wound Care: Routine skin checks 7. Fluids/Electrolytes/Nutrition: Routine in and outs with follow-up chemistries 8.  Acute lower GI bleed with blood loss anemia.  Follow-up GI services.  Resume ASA as above. - Continue Protonix 40 mg twice daily 7/6: hgb 9.6--monitor for gross bleeding 7/11- last Hb stable at 9.8- will recheck this week             7/12- Hb came back at  9.7- stable- con't regimen             7/13-  Hb up to 11.1- will con't to monitor  7/17- Hb 10.6- con't regimen  7/22- Hb down slightly to 9.3- con't Aranesp.  On Aranesp q Friday for anemia or chronic disease due to ESRD 9.  Chronic diastolic congestive heart failure.  Monitor for any signs of fluid overload.  Continue Lasix 40-80 mg Monday Wednesday Friday             -daily weights, have been stable             7/12- weight bounced up 1 kg today, however was this level 2-3 days ago, so think yesterday's weight might have been slightly off- will monitor  7/22- weight was up yesterday to 86 kg, but down to 80.8 kg today- back to baseline- con't regimen 10.  Newly Dx'd End-stage renal disease.  Hemodialysis as per renal services             7/12- questionable steal syndrome since Fistula placement- will monitor per Vascular- to see if  needs additional intervention  7/19- R hand still cooler to touch- also has decreased dexterity- will likely need EMG after d/c.   7/22- had HD yesterday, not Wednesday when was due- per Renal, will get back on track.  11.  Hypertension.  Increase Coreg to 12.5 mg twice daily. Aggressive filtration with ultradialysis tomorrow             7/11- HD today- BP 170s/90s this AM- per renal.             7/12- BP dropped to 70s/50s with HD yesterday- back up to 150s/80s today- con't reigmen  7/17- BP 150s/160s- per renal- will only start 2.5 mg of Ritalin due to BP issues- will monitor closely.  Vitals:   05/13/21 2033 05/14/21 0535  BP:  (!) 132/58  Pulse:  80  Resp:  17  Temp:  98.2 F (36.8 C)  SpO2: 100% 100%  7/22- BP looking better in spite of increase in Ritalin- con't ritalin 5 mg BID 12.  Asthma.  Continue inhalers as directed             - was getting Home O2 prior to admission- will likely need to go home on 2L O2-   Was trying to get at home, but hadn't yet.             7/1- con't nebs prn, home O2 ?PRN only             7/3 lungs very clear today, sats 99% on RA. Some of SOB may be behaviorally  related. Reassured pt today that lungs sounded fine.                         -encouraged FV/IS, OOB             7/7: ordered CXT-stable, ordered incentive spirometer 13.Hyperlipidemia.Crestor 14.Obesity.BMI 34.31.Dietery follow up 15.  Diabetes mellitus.hemoglobin AIc 5.2.  CBGs 211-310. Increase Lantus to 13U BID.  7/17- BG's don't look as good- >200- will follow trend before making change yet.   7/20- increased Lantus to 8 units daily- will monitor- increased from 5 units.   7/22- Bgs 139 to 251 in last 24 hours- increase Lantus to 10 units daily  16. Leukocytosis: trending down, repeat Monday             7/11- last WBC  was 13k- will recheck in HD today.              7/13- WBC down to 10.1- will monitor  7/16- Last WBC 12.1- was 12.8 last week- bounced down and went back up again?- con't to monitor, labs in HD  7/22- WBC stable at 12.1- don't see a cause?  17. Constipation: KUB shows moderate stool burden. Increase senna-docusate to BID.              7/13- LBM yesterday- con't regimen  7/19- LBM 2-3 days ago- will give a dose of Sorbitol at lunch time. 7/20- will give another dose of Sorbitol since cannot give Mg- early this AM- so might go before HD- KUB shows moderate volume of stool throughout colon- could be cause of LUQ pain   7/22- had huge BM with enema yesterday- con't bowel meds and LUQ pain gone with BM  18. Fistula placement: successful, new fistula accessed without complications.  7/20- possible steal syndrome of RUE- cooler hand on R- might need EMG after d/c.  19. Left shoulder pain: ordered lidocaine patch and voltaren gel. 20. Lethargy             7/12- is much improved today- will con't to monitor- CT and CXR of chest were stable             7/16- will readd Melatonin for sleep- never had the chance to use it.   7/17- didn't sleep at all- hopefully Ritalin due day 2.5 mg BID- will help- cannot use Amantadine secondary to HD.   7/22- tolerating Ritalin 5 mg BID= doing  better   21. Insomnia/mixing of sleep wake cycle  7/20- will try Trazodone 50 mg QHS and see if can get back on good sleep cycle? Encouraged pt/staff to try and keep her awake more during day.   7/21- didn't sleep more than 1 hour overnight- will increase trazodone to 75 mg QHS. More awake this AM  7/22- slept a few hours last night- slightly better- will increase Trazodone to 100 mg at 8pm nightly to reduce sleepiness in AM.

## 2021-05-14 NOTE — Progress Notes (Addendum)
VASCULAR SURGERY:  Dr. [name] called me and asked to check on her hand she has had some weakness.  On my exam she has an excellent thrill in her fistula.  Her right arm incision looks fine.  She has a palpable radial pulse.  She has a brisk radial ulnar and palmar arch signal with the Doppler.  Not clear to me why she has some weakness in the arm. The only nerve near the basilic vein is the medial antebrachial cutaneous nerve.  This nerve is carefully preserved and retraction on this would not cause arm weakness.  She did have an axillary block and I have asked anesthesia to take a look at her.  She will keep her regularly scheduled follow-up appointment with me as an outpatient.  Gae Gallop, MD 2:56 PM

## 2021-05-14 NOTE — Progress Notes (Signed)
Occupational Therapy Session Note  Patient Details  Name: Carolyn Russo MRN: [id number] Date of Birth: 09-24-1951  Today's Date: 05/14/2021 OT Individual Time: 1008-1020 OT Individual Time Calculation (min): 12 min  and Today's Date: 05/14/2021 OT Missed Time: 21 Minutes Missed Time Reason: Other (comment) (lethargy)   Short Term Goals:  Week 2:  OT Short Term Goal 1 (Week 2): Pt will complete 2/3 toileting tasks with no more than Min A OT Short Term Goal 2 (Week 2): Pt will perform LB dress with Mod A at sit <> stand level OT Short Term Goal 3 (Week 2): Pt will use R hand to self feed with AE PRN with Supervision OT Short Term Goal 4 (Week 2): Pt will consistently perform ADL transfers with CGA and LRAD  Skilled Therapeutic Interventions/Progress Updates:    Pt received sitting in w/c, fast asleep. Pt difficult to arouse despite multimodal cueing. Assessed vitals- BP 150/60, HR 73 SpO2 98%. Pt intermittently opening eyes to OT stating she needs to transfer back to bed with this level of lethargy. Pt nods head yes. She required mod facilitation to initiate sit > stand but then pivoted with min A to bed. She transitioned to supine with CGA. She was left supine, already fast asleep within seconds. Bed alarm set. 45 min missed d/t pt lethargy.   Therapy Documentation Precautions:  Precautions Precautions: Fall, Other (comment) Precaution Comments: L inattention, L hemiparesis, seizure protocol, impaired R UE coordination, R port a cath Restrictions Weight Bearing Restrictions: No   Therapy/Group: Individual Therapy  Curtis Sites 05/14/2021, 6:37 AM

## 2021-05-14 NOTE — Progress Notes (Signed)
Pt's BP 190/88, HR 75. Patient denies headache, + pain on bilateral hands. Dr. Read Drivers informed, awaiting for orders.

## 2021-05-14 NOTE — Progress Notes (Signed)
Occupational Therapy Note  Patient Details  Name: Carolyn Russo MRN: [id number] Date of Birth: 10/03/1951  Today's Date: 05/14/2021 OT Missed Time: 79 Minutes Missed Time Reason: Patient fatigue (lethargy)  Pt sleeping in bed upon arrival. Pt required max verbal cues to partially arouse. Pt unable to keep eyes open and participate. Pt missed 60 mins skilled OT services.   Leotis Shames [facility] 05/14/2021, 12:13 PM

## 2021-05-15 LAB — GLUCOSE, CAPILLARY
Glucose-Capillary: 128 mg/dL — ABNORMAL HIGH (ref 70–99)
Glucose-Capillary: 154 mg/dL — ABNORMAL HIGH (ref 70–99)
Glucose-Capillary: 150 mg/dL — ABNORMAL HIGH (ref 70–99)
Glucose-Capillary: 203 mg/dL — ABNORMAL HIGH (ref 70–99)

## 2021-05-15 MED ORDER — ONDANSETRON HCL 4 MG/2ML IJ SOLN
INTRAMUSCULAR | Status: AC
  Filled 2021-05-15: qty 2

## 2021-05-15 MED ORDER — DICLOFENAC SODIUM 1 % EX GEL
2.0000 g | Freq: Four times a day (QID) | CUTANEOUS | Status: DC
  Administered 2021-05-15 – 2021-05-27 (×41): 2 g via TOPICAL
  Filled 2021-05-15: qty 100

## 2021-05-15 MED ORDER — HEPARIN SODIUM (PORCINE) 1000 UNIT/ML IJ SOLN
INTRAMUSCULAR | Status: AC
  Administered 2021-05-15: 1000 [IU] via INTRAVENOUS_CENTRAL
  Filled 2021-05-15: qty 4

## 2021-05-15 NOTE — Progress Notes (Signed)
[facility] Progress Note   Subjective:  Seen in room. Sleeping soundly this am, rouses briefly to voice then falls asleep. For dialysis today.   Objective Vitals:   05/14/21 2307 05/15/21 0359 05/15/21 0530 05/15/21 0719  BP: (!) 179/74 (!) 168/76    Pulse: 79 80    Resp:  14    Temp:  98.2 F (36.8 C)    TempSrc:  Oral    SpO2: 99% 99%  98%  Weight:   85.2 kg   Height:       Physical Exam General: Frail woman, NAD. In chair. Heart: RRR; 2/6 murmur Lungs: CTAB Abdomen: soft Extremities: No LE edema Dialysis Access: RUE AVF + thrill  Additional Objective Labs: CBC: Recent Labs  Lab 05/14/21 0454  WBC 12.1*  HGB 9.3*  HCT 27.7*  MCV 87.4  PLT 290    CBG: Recent Labs  Lab 05/14/21 0620 05/14/21 1115 05/14/21 1658 05/14/21 2114 05/15/21 0641  GLUCAP 140* 240* 217* 191* 128*    Studies/Results: No results found.  Medications:   (feeding supplement) PROSource Plus  30 mL Oral BID BM   aspirin EC  81 mg Oral Daily   B-complex with vitamin C  1 tablet Oral Daily   carvedilol  3.125 mg Oral BID WC   Chlorhexidine Gluconate Cloth  6 each Topical BID   cholecalciferol  2,000 Units Oral Daily   clopidogrel  75 mg Oral Daily   darbepoetin (ARANESP) injection - DIALYSIS  100 mcg Intravenous Q Sat-HD   heparin  5,000 Units Subcutaneous Q8H   insulin aspart  0-6 Units Subcutaneous TID WC   insulin glargine  10 Units Subcutaneous Daily   levETIRAcetam  500 mg Oral QHS   melatonin  3 mg Oral QHS   methylphenidate  5 mg Oral BID WC   mometasone-formoterol  2 puff Inhalation BID   pantoprazole  40 mg Oral Daily   polyethylene glycol  17 g Oral Daily   rosuvastatin  20 mg Oral Daily   senna-docusate  2 tablet Oral BID   sevelamer carbonate  800 mg Oral TID WC   traZODone  100 mg Oral QHS    Dialysis Orders: MWF @ [facility] 4 hrs 180NRe 400/Autoflow 1.5 86.5 kg 2.0K/2.0 Ca TDC, heparin 2000 units bolus - Mircera 100 mcg IV q 2 weeks (last dose  04/14/2021 - Hectorol 5 mcg IV TIW   Assessment/Plan: 1. Acute R subcortical CVA: Back in CIR as of 7/15, transiently back to [facility] 7/14-7/15 with recurrent stroke like symptoms, likely watershed v. hypotensive issue per neuro. 2. Acute LGIB: Hx hemorrhoidal bleeding in past - s/p 2U PRBCs 6/26. S/p EGD/flex sig without clear bleeding source. Plavix and ASA held, now resumed. 3. ESRD: Off of her usual MWF schedule due to high HD census. Last HD was 7/21. For HD today, then back to schedule on Monday.  No added heparin. 4. Dialysis Access: S/p 1st stage BVT 7/8 by Dr. Scot Dock - mild steal symptoms, monitoring for now. 5. HTN/volume: Variable BP - trying to avoid intradialytic hypotension, she is well below OP dry weight. 6. Anemia: Hgb 9.3 - continue Aranesp 14mcg q Friday. 7. Secondary hyperparathyroidism: Hectoral on hold for low PTH x 2. Binder changed from Renvela pills to powder d/t issues swallowing pills. Ca/Phos ok for now. 8. Nutrition: Alb low, continue supplements. 9. T2DM: Per primary. 10. Insomnia: Per notes, has been awake at night and sleeping during day - primary trialing ritalin during day  and melatonin at night. Watch BP with stimulants.  Lynnda Child PA-C [facility] 05/15/2021,9:28 AM

## 2021-05-15 NOTE — Progress Notes (Signed)
PROGRESS NOTE     Subjective/Complaints: No issues overnight, denies complaints.  When asked about right upper extremity, she states she feels pain in her wrist when she uses a walker and this makes it hard to grip.  No numbness or tingling   ROS:  Pt denies SOB, abd pain, CP, N/V/C/D, and vision changes   Objective:       Physical Exam: Vital Signs    Vitals with BMI 05/15/2021 05/14/2021 05/14/2021  Height - - -  Weight 187 lbs 13 oz - -  BMI 95.28 - -  Systolic 413 244 010  Diastolic 76 74 88  Pulse 80 79 -                  Musculoskeletal:     No evidence of joint swelling in the elbow wrist MCPs PIPs or DIPs No erythema.  There is no pain with wrist range of motion.  There is no pain to palpation over the wrist area.  No pain to palpation over the MCPs PIPs or DIPs. Skin:    General: Skin is warm and dry. Neurological:    Comments: alert, follows commands.   Fair insight and awareness. Provided biographical information. Normal language, speech slightly slurred. Mild left central 7. LUE 2 to 2+/5 deltoid, 3/5 biceps, triceps and 3+/5 wrist and HI. LLE 3-4/5 prox to distal. RUE 4/5 and RLE 5/5 prox to distal.  Tone is normal in the right upper limb no sensory deficits noted--motor/sensory exam stable today.  also cannot do finger touching to each finger/thumb- not able to do any dexterity movements, or hold pen.   NO change on dexterity this AM   Assessment/Plan: 1. Functional deficits which require 3+ hours per day of interdisciplinary therapy in a comprehensive inpatient rehab setting. Physiatrist is providing close team supervision and 24 hour management of active medical problems listed below. Physiatrist and rehab team continue to assess barriers to discharge/monitor patient progress toward functional and medical goals   Care Tool:   Bathing   Body parts bathed by patient: Right arm, Left arm,  Chest, Abdomen, Front perineal area, Right upper leg, Left upper leg    Body parts bathed by helper: Right lower leg, Left lower leg, Buttocks    Bathing assist Assist Level: Minimal Assistance - Patient > 75%    Upper Body Dressing/Undressing Upper body dressing What is the patient wearing?: Pull over shirt   Upper body assist Assist Level: Minimal Assistance - Patient > 75%   Lower Body Dressing/Undressing Lower body dressing       What is the patient wearing?: Pants, Incontinence brief        Lower body assist Assist for lower body dressing: Minimal Assistance - Patient > 75%     Toileting Toileting   Toileting assist Assist for toileting: Moderate Assistance - Patient 50 - 74%    Transfers Chair/bed transfer   Transfers assist     Chair/bed transfer assist level: Minimal Assistance - Patient > 75%    Locomotion Ambulation     Ambulation assist       Assist level: Minimal Assistance - Patient > 75% Assistive device: Walker-rolling Max  distance: 50'    Walk 10 feet activity     Assist     Assist level: Minimal Assistance - Patient > 75% Assistive device: Walker-rolling    Walk 50 feet activity     Assist Walk 50 feet with 2 turns activity did not occur: Safety/medical concerns   Assist level: Minimal Assistance - Patient > 75% Assistive device: Walker-rolling      Walk 150 feet activity     Assist Walk 150 feet activity did not occur: Safety/medical concerns        Walk 10 feet on uneven surface activity     Assist Walk 10 feet on uneven surfaces activity did not occur: Safety/medical concerns         Wheelchair         Assist Will patient use wheelchair at discharge?:  (TBD) Type of Wheelchair: Manual   Wheelchair assist level: Dependent - Patient 0% Max wheelchair distance: 150'      Wheelchair 50 feet with 2 turns activity       Assist           Assist Level: Dependent - Patient 0%    Wheelchair 150 feet  activity       Assist       Assist Level: Dependent - Patient 0%     Medical Problem List and Plan: 1.  Left side weakness secondary to nonhemorrhagic right basal ganglia infarction             -patient may shower Reviewed noted 2 day interrupted stay 7/13--7/15 for altered MS , no new CVA identified             con't OT, OT and SLP- will add Ritalin for lethargy and initiation- however due to BP being on high side, will do 2.5 mg with breakfast and lunch. Continue CIR- PT, OT and SLP  2.  Impaired mobility: -DVT/anticoagulation:  continue SCD             -antiplatelet therapy:ASA             Plavix restarted. 3. Right hip bursitis: continue Tramadol 50 mg every 12 hours as needed, ice, ROM, lidocaine patch 4. Anxiety/stroke poor initiation: continue Xanax 0.5 mg 3 times daily as needed.  Provide emotional support             7/12- per husband, concerned about flat affect- will d/w possible depression with pt- if so, will start SSRI.              7/13- will wait for now per husband  7/20- BP looks OK- will increase to 5 mg BID  7/22- more awake, alert this AM than usual- more initiation- con't regimen- BP controlled for her.              -antipsychotic agents: N/A 5. Neuropsych: This patient is ?capable of making decisions on her own behalf. 6. Skin/Wound Care: Routine skin checks 7. Fluids/Electrolytes/Nutrition: Routine in and outs with follow-up chemistries 8.  Acute lower GI bleed with blood loss anemia.  Follow-up GI services.  Resume ASA as above. - Continue Protonix 40 mg twice daily 7/6: hgb 9.6--monitor for gross bleeding 7/11- last Hb stable at 9.8- will recheck this week             7/12- Hb came back at 9.7- stable- con't regimen             7/13- Hb up to 11.1- will con't to monitor  7/17- Hb 10.6- con't regimen  7/22- Hb down slightly to 9.3- con't Aranesp.  On Aranesp q Friday for anemia or chronic disease due to ESRD 9.  Chronic diastolic congestive heart  failure.  Monitor for any signs of fluid overload.  Continue Lasix 40-80 mg Monday Wednesday Friday             -daily weights, have been stable             7/12- weight bounced up 1 kg today, however was this level 2-3 days ago, so think yesterday's weight might have been slightly off- will monitor  7/22- weight was up yesterday to 86 kg, but down to 80.8 kg today- back to baseline- con't regimen 10.  Newly Dx'd End-stage renal disease.  Hemodialysis as per renal services             7/12- questionable steal syndrome since Fistula placement- will monitor per Vascular- to see if  needs additional intervention  7/19- R hand still cooler to touch- also has decreased dexterity- will likely need EMG after d/c.   7/22- had HD yesterday, not Wednesday when was due- per Renal, will get back on track.  11.  Hypertension.  Increase Coreg to 12.5 mg twice daily. Aggressive filtration with ultradialysis tomorrow             7/11- HD today- BP 170s/90s this AM- per renal.             7/12- BP dropped to 70s/50s with HD yesterday- back up to 150s/80s today- con't reigmen  7/17- BP 150s/160s- per renal- will only start 2.5 mg of Ritalin due to BP issues- will monitor closely.  Vitals:   05/15/21 0359 05/15/21 0719  BP: (!) 168/76   Pulse: 80   Resp: 14   Temp: 98.2 F (36.8 C)   SpO2: 99% 98%   7/22- BP looking better in spite of increase in Ritalin- con't ritalin 5 mg BID 12.  Asthma.  Continue inhalers as directed             - was getting Home O2 prior to admission- will likely need to go home on 2L O2-   Was trying to get at home, but hadn't yet.             7/1- con't nebs prn, home O2 ?PRN only             7/3 lungs very clear today, sats 99% on RA. Some of SOB may be behaviorally related. Reassured pt today that lungs sounded fine.                         -encouraged FV/IS, OOB             7/7: ordered CXT-stable, ordered incentive spirometer 13.Hyperlipidemia.Crestor 14.Obesity.BMI  34.31.Dietery follow up 15.  Diabetes mellitus.hemoglobin AIc 5.2.  CBGs 211-310. Increase Lantus to 13U BID.  7/17- BG's don't look as good- >200- will follow trend before making change yet.   7/20- increased Lantus to 8 units daily- will monitor- increased from 5 units.   7/22- Bgs 139 to 251 in last 24 hours- increase Lantus to 10 units daily  16. Leukocytosis: trending down, repeat Monday             7/11- last WBC was 13k- will recheck in HD today.              7/13- WBC down to 10.1-  will monitor  7/16- Last WBC 12.1- was 12.8 last week- bounced down and went back up again?- con't to monitor, labs in HD  7/22- WBC stable at 12.1- don't see a cause?  17. Constipation: KUB shows moderate stool burden. Increase senna-docusate to BID.              7/13- LBM yesterday- con't regimen  7/19- LBM 2-3 days ago- will give a dose of Sorbitol at lunch time. 7/20- will give another dose of Sorbitol since cannot give Mg- early this AM- so might go before HD- KUB shows moderate volume of stool throughout colon- could be cause of LUQ pain   7/22- had huge BM with enema yesterday- con't bowel meds and LUQ pain gone with BM  18. Fistula placement: successful, new fistula accessed without complications.  7/20- possible steal syndrome of RUE- cooler hand on R- might need EMG after d/c.  19. Left shoulder pain: ordered lidocaine patch and voltaren gel. 20. Lethargy             7/12- is much improved today- will con't to monitor- CT and CXR of chest were stable             7/16- will readd Melatonin for sleep- never had the chance to use it.   7/17- didn't sleep at all- hopefully Ritalin due day 2.5 mg BID- will help- cannot use Amantadine secondary to HD.   7/22- tolerating Ritalin 5 mg BID= doing better   21. Insomnia/mixing of sleep wake cycle  7/20- will try Trazodone 50 mg QHS and see if can get back on good sleep cycle? Encouraged pt/staff to try and keep her awake more during day.   7/21- didn't  sleep more than 1 hour overnight- will increase trazodone to 75 mg QHS. More awake this AM  7/22- slept a few hours last night- slightly better- will increase Trazodone to 100 mg at 8pm nightly to reduce sleepiness in AM.  22.  Right upper extremity discomfort, difficult to get a good history.  Today she is complaining more of wrist pain when using the walker.  Her exam is unremarkable no history of trauma.  We will trial some Voltaren gel to the right wrist 4 times per day

## 2021-05-16 LAB — GLUCOSE, CAPILLARY
Glucose-Capillary: 158 mg/dL — ABNORMAL HIGH (ref 70–99)
Glucose-Capillary: 242 mg/dL — ABNORMAL HIGH (ref 70–99)
Glucose-Capillary: 224 mg/dL — ABNORMAL HIGH (ref 70–99)
Glucose-Capillary: 273 mg/dL — ABNORMAL HIGH (ref 70–99)

## 2021-05-16 NOTE — Progress Notes (Signed)
PROGRESS NOTE     Subjective/Complaints:  Patient sleeping awakens to voice briefly then falls back asleep.   ROS:  Pt denies SOB, abd pain, CP, N/V/C/D, and vision changes   Objective:       Physical Exam: Vital Signs    Vitals with BMI 05/16/2021 05/15/2021 05/15/2021  Height - - -  Weight 181 lbs 14 oz - -  BMI 06.26 - -  Systolic 948 546 -  Diastolic 66 63 -  Pulse 84 83 88                 General: No acute distress Mood and affect are appropriate Heart: Regular rate and rhythm no rubs murmurs or extra sounds Lungs: Clear to auscultation, breathing unlabored, no rales or wheezes Abdomen: Positive bowel sounds, soft nontender to palpation, nondistended Extremities: No clubbing, cyanosis, or edema Skin: No evidence of breakdown, no evidence of rash Musculoskeletal:     No evidence of joint swelling in the elbow wrist MCPs PIPs or DIPs No erythema.  There is no pain with wrist range of motion.  There is no pain to palpation over the wrist area.  No pain to palpation over the MCPs PIPs or DIPs. Skin:    General: Skin is warm and dry. Neurological:    Comments: alert, follows commands.   Fair insight and awareness. Provided biographical information. Normal language, speech slightly slurred. Mild left central 7. LUE 2 to 2+/5 deltoid, 3/5 biceps, triceps and 3+/5 wrist and HI. LLE 3-4/5 prox to distal. RUE 4/5 and RLE 5/5 prox to distal.  Tone is normal in the right upper limb no sensory deficits noted--motor/sensory exam stable today.  also cannot do finger touching to each finger/thumb- not able to do any dexterity movements, or hold pen.   NO change on dexterity this AM   Assessment/Plan: 1. Functional deficits which require 3+ hours per day of interdisciplinary therapy in a comprehensive inpatient rehab setting. Physiatrist is providing close team supervision and 24 hour management of active medical  problems listed below. Physiatrist and rehab team continue to assess barriers to discharge/monitor patient progress toward functional and medical goals   Care Tool:   Bathing   Body parts bathed by patient: Right arm, Left arm, Chest, Abdomen, Front perineal area, Right upper leg, Left upper leg    Body parts bathed by helper: Right lower leg, Left lower leg, Buttocks    Bathing assist Assist Level: Minimal Assistance - Patient > 75%    Upper Body Dressing/Undressing Upper body dressing What is the patient wearing?: Pull over shirt   Upper body assist Assist Level: Minimal Assistance - Patient > 75%   Lower Body Dressing/Undressing Lower body dressing       What is the patient wearing?: Pants, Incontinence brief        Lower body assist Assist for lower body dressing: Minimal Assistance - Patient > 75%     Toileting Toileting   Toileting assist Assist for toileting: Moderate Assistance - Patient 50 - 74%    Transfers Chair/bed transfer   Transfers assist     Chair/bed transfer assist level: Minimal Assistance - Patient > 75%  Locomotion Ambulation     Ambulation assist       Assist level: Minimal Assistance - Patient > 75% Assistive device: Walker-rolling Max distance: 50'    Walk 10 feet activity     Assist     Assist level: Minimal Assistance - Patient > 75% Assistive device: Walker-rolling    Walk 50 feet activity     Assist Walk 50 feet with 2 turns activity did not occur: Safety/medical concerns   Assist level: Minimal Assistance - Patient > 75% Assistive device: Walker-rolling      Walk 150 feet activity     Assist Walk 150 feet activity did not occur: Safety/medical concerns        Walk 10 feet on uneven surface activity     Assist Walk 10 feet on uneven surfaces activity did not occur: Safety/medical concerns         Wheelchair         Assist Will patient use wheelchair at discharge?:  (TBD) Type of Wheelchair: Manual    Wheelchair assist level: Dependent - Patient 0% Max wheelchair distance: 150'      Wheelchair 50 feet with 2 turns activity       Assist           Assist Level: Dependent - Patient 0%    Wheelchair 150 feet activity       Assist       Assist Level: Dependent - Patient 0%     Medical Problem List and Plan: 1.  Left side weakness secondary to nonhemorrhagic right basal ganglia infarction             -patient may shower Reviewed noted 2 day interrupted stay 7/13--7/15 for altered MS , no new CVA identified             con't OT, OT and SLP- will add Ritalin for lethargy and initiation- however due to BP being on high side, will do 2.5 mg with breakfast and lunch. Continue CIR- PT, OT and SLP  2.  Impaired mobility: -DVT/anticoagulation:  continue SCD             -antiplatelet therapy:ASA             Plavix restarted. 3. Right hip bursitis: continue Tramadol 50 mg every 12 hours as needed, ice, ROM, lidocaine patch 4. Anxiety/stroke poor initiation: continue Xanax 0.5 mg 3 times daily as needed.  Provide emotional support             7/12- per husband, concerned about flat affect- will d/w possible depression with pt- if so, will start SSRI.             7/20- BP looks OK- will increase to 5 mg BID  7/22- more awake, alert this AM than usual- more initiation- con't regimen- BP controlled for her.              -antipsychotic agents: N/A 5. Neuropsych: This patient is ?capable of making decisions on her own behalf. 6. Skin/Wound Care: Routine skin checks 7. Fluids/Electrolytes/Nutrition: Routine in and outs with follow-up chemistries 8.  Acute lower GI bleed with blood loss anemia.  Follow-up GI services.  Resume ASA as above. - Continue Protonix 40 mg twice daily 7/6: hgb 9.6--monitor for gross bleeding 7/11- last Hb stable at 9.8- will recheck this week             7/12- Hb came back at 9.7- stable- con't regimen  7/13- Hb up to 11.1- will con't to  monitor  7/17- Hb 10.6- con't regimen  7/22- Hb down slightly to 9.3- con't Aranesp.  On Aranesp q Friday for anemia or chronic disease due to ESRD 9.  Chronic diastolic congestive heart failure.  Monitor for any signs of fluid overload.  Continue Lasix 40-80 mg Monday Wednesday Friday             -daily weights, have been stable             7/12- weight bounced up 1 kg today, however was this level 2-3 days ago, so think yesterday's weight might have been slightly off- will monitor  7/22- weight was up yesterday to 86 kg, but down to 80.8 kg today- back to baseline- con't regimen 10.  Newly Dx'd End-stage renal disease.  Hemodialysis as per renal services             7/12- questionable steal syndrome since Fistula placement- will monitor per Vascular- to see if  needs additional intervention  7/19- R hand still cooler to touch- also has decreased dexterity- will likely need EMG after d/c.   7/22- had HD yesterday, not Wednesday when was due- per Renal, will get back on track.  11.  Hypertension.  Increase Coreg to 12.5 mg twice daily. Aggressive filtration with ultradialysis tomorrow             7/11- HD today- BP 170s/90s this AM- per renal.             7/12- BP dropped to 70s/50s with HD yesterday- back up to 150s/80s today- con't reigmen  7/17- BP 150s/160s- per renal- will only start 2.5 mg of Ritalin due to BP issues- will monitor closely.  Vitals:   05/16/21 0537 05/16/21 0735  BP: (!) 150/66   Pulse: 84   Resp: 20   Temp: 98.2 F (36.8 C)   SpO2: 97% 95%   0/98- mild systolic elevation 12.  Asthma.  Continue inhalers as directed             - was getting Home O2 prior to admission- will likely need to go home on 2L O2-   Was trying to get at home, but hadn't yet.             7/1- con't nebs prn, home O2 ?PRN only             7/3 lungs very clear today, sats 99% on RA. Some of SOB may be behaviorally related. Reassured pt today that lungs sounded fine.                          -encouraged FV/IS, OOB             7/7: ordered CXT-stable, ordered incentive spirometer 13.Hyperlipidemia.Crestor 14.Obesity.BMI 34.31.Dietery follow up 15.  Diabetes mellitus.hemoglobin AIc 5.2.  CBGs 211-310. Increase Lantus to 13U BID.  7/17- BG's don't look as good- >200- will follow trend before making change yet.   7/20- increased Lantus to 8 units daily- will monitor- increased from 5 units.   7/22- Bgs 139 to 251 in last 24 hours- increase Lantus to 10 units daily CBG (last 3)  Recent Labs    05/15/21 1812 05/15/21 2106 05/16/21 0602  GLUCAP 150* 203* 158*  Fair control continue current meds overall improving 7/24   16. Leukocytosis: trending down, repeat Monday  7/11- last WBC was 13k- will recheck in HD today.              7/13- WBC down to 10.1- will monitor  7/16- Last WBC 12.1- was 12.8 last week- bounced down and went back up again?- con't to monitor, labs in HD  7/22- WBC stable at 12.1- don't see a cause?  17. Constipation: KUB shows moderate stool burden. Increase senna-docusate to BID.              7/13- LBM yesterday- con't regimen  7/19- LBM 2-3 days ago- will give a dose of Sorbitol at lunch time. 7/20- will give another dose of Sorbitol since cannot give Mg- early this AM- so might go before HD- KUB shows moderate volume of stool throughout colon- could be cause of LUQ pain   7/22- had huge BM with enema yesterday- con't bowel meds and LUQ pain gone with BM  18. Fistula placement: successful, new fistula accessed without complications.  7/20- possible steal syndrome of RUE- cooler hand on R- might need EMG after d/c.  19. Left shoulder pain: ordered lidocaine patch and voltaren gel. 20. Lethargy             7/12- is much improved today- will con't to monitor- CT and CXR of chest were stable             7/16- will readd Melatonin for sleep- never had the chance to use it.   7/17- didn't sleep at all- hopefully Ritalin due day 2.5 mg BID- will help-  cannot use Amantadine secondary to HD.   7/22- tolerating Ritalin 5 mg BID= doing better   21. Insomnia/mixing of sleep wake cycle  7/20- will try Trazodone 50 mg QHS and see if can get back on good sleep cycle? Encouraged pt/staff to try and keep her awake more during day.   7/21- didn't sleep more than 1 hour overnight- will increase trazodone to 75 mg QHS. More awake this AM  7/22- slept a few hours last night- slightly better- will increase Trazodone to 100 mg at 8pm nightly to reduce sleepiness in AM.  22.  Right upper extremity discomfort, difficult to get a good history.  Today she is complaining more of wrist pain when using the walker.  Her exam is unremarkable no history of trauma.  We will trial some Voltaren gel to the right wrist 4 times per day

## 2021-05-16 NOTE — Progress Notes (Signed)
Physical Therapy Weekly Progress Note  Patient Details  Name: Carolyn Russo MRN: [id number] Date of Birth: 1950-11-05  Beginning of progress report period: May 08, 2021 End of progress report period: May 16, 2021  Today's Date: 05/16/2021 PT Individual Time: 1515-1600 PT Individual Time Calculation (min): 45 min   Patient has met 4 of 4 short term goals.  Pt is making slow but steady progress towards therapy goals. She is currently min A overall for bed mobility, CGA for transfers with RW, and can ambulate up to 100 ft with RW and CGA to min A. Pt is able to perform stairs with mod A when last evaluated. Pt however has exhibited some inconsistent progress at times due to impaired cognition. She remains limited by ongoing lethargy limiting ability to functionally participate in therapy sessions at times. Pt can be self-limiting and needs encouragement to push herself.  Patient continues to demonstrate the following deficits muscle weakness, decreased cardiorespiratoy endurance, decreased coordination, decreased initiation, decreased attention, decreased awareness, decreased problem solving, decreased safety awareness, decreased memory, and delayed processing, and decreased sitting balance, decreased standing balance, decreased postural control, hemiplegia, and decreased balance strategies and therefore will continue to benefit from skilled PT intervention to increase functional independence with mobility.  Patient progressing toward long term goals..  Continue plan of care.  PT Short Term Goals Week 1:  PT Short Term Goal 1 (Week 1): Pt will perform supine<>sit with min assist PT Short Term Goal 1 - Progress (Week 1): Met PT Short Term Goal 2 (Week 1): Pt will perform sit<>stands using LRAD with CGA PT Short Term Goal 2 - Progress (Week 1): Met PT Short Term Goal 3 (Week 1): Pt will perform bed<>chair transfers using LRAD with CGA PT Short Term Goal 3 - Progress (Week 1): Met PT Short Term  Goal 4 (Week 1): Pt will ambulate at least 42f using LRAD with min assist PT Short Term Goal 4 - Progress (Week 1): Met PT Short Term Goal 5 (Week 1): Pt will ascend/descend 4 steps using B HRs with min assist Week 2:  PT Short Term Goal 1 (Week 2): =LTG due to ELOS  Skilled Therapeutic Interventions/Progress Updates:    Pt received seated in w/c in room eating lunch with assist from husband. Pt missed 15 min of scheduled therapy session due to needing time to finish eating lunch. This therapist returns after 15 min and pt done eating and agreeable to PT session. No complaints of pain. Pt received seated on toilet handed off from NT. [facility] with use of grab bar. Pt is setup A for pericare, requires max A to pull brief and pants up over hips. Stand pivot transfer to w/c with min A. Assisted pt with donning pants and shirt with max A for time conservation. Ambulation x 25 ft, x 15 ft with use of RW and CGA for balance. Pt requests to sit during initial bout of ambulation due to R wrist pain with Wbing through RW, added RUE splint. Pt exhibits improved grip with RUE and decreased pain with use of R hand splint. Pt requests to stop during 2nd bout of ambulation due to urge to urinate. Toilet transfer with min A and use of grab bar. Pt found to be incontinent of urine in brief, dependent for brief change. Pt left seated on toilet in order to attempt to have a BM, call button in reach, husband present.  Therapy Documentation Precautions:  Precautions Precautions: Fall, Other (comment) Precaution Comments: L inattention, L hemiparesis, seizure protocol, impaired R UE coordination, R port a cath Restrictions Weight Bearing Restrictions: No General: PT Amount of Missed Time (min): 15 Minutes PT Missed Treatment Reason: Unavailable (Comment) (eating lunch)     Therapy/Group: Individual Therapy   Excell Seltzer, PT, DPT, CSRS 05/16/2021, 5:07 PM

## 2021-05-16 NOTE — Progress Notes (Signed)
[facility] Progress Note   Subjective:  Seen in room. More alert today, eating breakfast. No complaints.  Had dialysis yesterday net UF 1L   Objective Vitals:   05/15/21 2305 05/16/21 0537 05/16/21 0600 05/16/21 0735  BP: (!) 155/63 (!) 150/66    Pulse: 83 84    Resp: 18 20    Temp:  98.2 F (36.8 C)    TempSrc:      SpO2: 98% 97%  95%  Weight:   82.5 kg   Height:       Physical Exam General: Frail woman, NAD. In chair. Heart: RRR; 2/6 murmur Lungs: CTAB Abdomen: soft Extremities: No LE edema Dialysis Access: RUE AVF + thrill  Additional Objective Labs: CBC: Recent Labs  Lab 05/14/21 0454  WBC 12.1*  HGB 9.3*  HCT 27.7*  MCV 87.4  PLT 290    CBG: Recent Labs  Lab 05/15/21 0641 05/15/21 1127 05/15/21 1812 05/15/21 2106 05/16/21 0602  GLUCAP 128* 154* 150* 203* 158*    Studies/Results: No results found.  Medications:   (feeding supplement) PROSource Plus  30 mL Oral BID BM   aspirin EC  81 mg Oral Daily   B-complex with vitamin C  1 tablet Oral Daily   carvedilol  3.125 mg Oral BID WC   Chlorhexidine Gluconate Cloth  6 each Topical BID   cholecalciferol  2,000 Units Oral Daily   clopidogrel  75 mg Oral Daily   darbepoetin (ARANESP) injection - DIALYSIS  100 mcg Intravenous Q Sat-HD   diclofenac Sodium  2 g Topical QID   heparin  5,000 Units Subcutaneous Q8H   insulin aspart  0-6 Units Subcutaneous TID WC   insulin glargine  10 Units Subcutaneous Daily   levETIRAcetam  500 mg Oral QHS   melatonin  3 mg Oral QHS   methylphenidate  5 mg Oral BID WC   mometasone-formoterol  2 puff Inhalation BID   pantoprazole  40 mg Oral Daily   polyethylene glycol  17 g Oral Daily   rosuvastatin  20 mg Oral Daily   senna-docusate  2 tablet Oral BID   sevelamer carbonate  800 mg Oral TID WC   traZODone  100 mg Oral QHS    Dialysis Orders: MWF @ [facility] 4 hrs 180NRe 400/Autoflow 1.5 86.5 kg 2.0K/2.0 Ca TDC, heparin 2000 units bolus - Mircera  100 mcg IV q 2 weeks (last dose 04/14/2021 - Hectorol 5 mcg IV TIW   Assessment/Plan: 1. Acute R subcortical CVA: Back in CIR as of 7/15, transiently back to [facility] 7/14-7/15 with recurrent stroke like symptoms, likely watershed v. hypotensive issue per neuro. 2. Acute LGIB: Hx hemorrhoidal bleeding in past - s/p 2U PRBCs 6/26. S/p EGD/flex sig without clear bleeding source. Plavix and ASA held, now resumed. 3. ESRD: HD MWF. Off schedule last week due to high HD census. Back on schedule Next HD 7/25.  No added heparin. 4. Dialysis Access: S/p 1st stage BVT 7/8 by Dr. Scot Dock - mild steal symptoms, monitoring for now. 5. HTN/volume: Variable BP - trying to avoid intradialytic hypotension, she is well below OP dry weight. 6. Anemia: Hgb 9.3 - continue Aranesp 168mcg q Friday. 7. Secondary hyperparathyroidism: Hectoral on hold for low PTH x 2. Binder changed from Renvela pills to powder d/t issues swallowing pills. Ca/Phos ok for now. 8. Nutrition: Alb low, continue supplements. 9. T2DM: Per primary. 10. Insomnia: Per notes, has been awake at night and sleeping during day - primary trialing ritalin  during day and melatonin at night. Watch BP with stimulants.  Lynnda Child PA-C [facility] 05/16/2021,11:13 AM

## 2021-05-17 ENCOUNTER — Ambulatory Visit: Payer: Medicare Other | Admitting: Podiatry

## 2021-05-17 LAB — CBC
HCT: 29.2 % — ABNORMAL LOW (ref 36.0–46.0)
Hemoglobin: 9.8 g/dL — ABNORMAL LOW (ref 12.0–15.0)
MCH: 29.7 pg (ref 26.0–34.0)
MCHC: 33.6 g/dL (ref 30.0–36.0)
MCV: 88.5 fL (ref 80.0–100.0)
Platelets: 297 10*3/uL (ref 150–400)
RBC: 3.3 MIL/uL — ABNORMAL LOW (ref 3.87–5.11)
RDW: 16.1 % — ABNORMAL HIGH (ref 11.5–15.5)
WBC: 10.6 10*3/uL — ABNORMAL HIGH (ref 4.0–10.5)
nRBC: 0 % (ref 0.0–0.2)

## 2021-05-17 LAB — GLUCOSE, CAPILLARY
Glucose-Capillary: 163 mg/dL — ABNORMAL HIGH (ref 70–99)
Glucose-Capillary: 187 mg/dL — ABNORMAL HIGH (ref 70–99)
Glucose-Capillary: 200 mg/dL — ABNORMAL HIGH (ref 70–99)

## 2021-05-17 LAB — RENAL FUNCTION PANEL
Albumin: 3.1 g/dL — ABNORMAL LOW (ref 3.5–5.0)
Anion gap: 8 (ref 5–15)
BUN: 28 mg/dL — ABNORMAL HIGH (ref 8–23)
CO2: 29 mmol/L (ref 22–32)
Calcium: 9.4 mg/dL (ref 8.9–10.3)
Chloride: 98 mmol/L (ref 98–111)
Creatinine, Ser: 5.52 mg/dL — ABNORMAL HIGH (ref 0.44–1.00)
GFR, Estimated: 8 mL/min — ABNORMAL LOW (ref >60)
Glucose, Bld: 244 mg/dL — ABNORMAL HIGH (ref 70–99)
Phosphorus: 3.7 mg/dL (ref 2.5–4.6)
Potassium: 4.7 mmol/L (ref 3.5–5.1)
Sodium: 135 mmol/L (ref 135–145)

## 2021-05-17 MED ORDER — ONDANSETRON HCL 4 MG/2ML IJ SOLN
INTRAMUSCULAR | Status: AC
  Administered 2021-05-17: 4 mg
  Filled 2021-05-17: qty 2

## 2021-05-17 MED ORDER — HEPARIN SODIUM (PORCINE) 1000 UNIT/ML IJ SOLN
INTRAMUSCULAR | Status: AC
  Filled 2021-05-17: qty 4

## 2021-05-17 MED ORDER — INSULIN GLARGINE 100 UNIT/ML ~~LOC~~ SOLN
13.0000 [IU] | Freq: Every day | SUBCUTANEOUS | Status: DC
  Administered 2021-05-18 – 2021-05-27 (×11): 13 [IU] via SUBCUTANEOUS
  Filled 2021-05-17 (×14): qty 0.13

## 2021-05-17 NOTE — Progress Notes (Signed)
Occupational Therapy Session Note  Patient Details  Name: Carolyn Russo MRN: [id number] Date of Birth: 1951/07/12  Today's Date: 05/17/2021 OT Individual Time: 2233-6122 OT Individual Time Calculation (min): 61 min    Short Term Goals: Week 2:  OT Short Term Goal 1 (Week 2): Pt will complete 2/3 toileting tasks with no more than Min A OT Short Term Goal 2 (Week 2): Pt will perform LB dress with Mod A at sit <> stand level OT Short Term Goal 3 (Week 2): Pt will use R hand to self feed with AE PRN with Supervision OT Short Term Goal 4 (Week 2): Pt will consistently perform ADL transfers with CGA and LRAD  Skilled Therapeutic Interventions/Progress Updates:  Pt received supine in bed, finishing up breakfast agreeable to OT intervention. Session focus on BADL reeducation. Pt currently requires CGA for bed mobility and  CGA for stand pivot transfer to w/c. Pt completed wash up at sink needing MIN A for bathing, pt requested assist to wash underneath LUE. Pt required CGA for sit<>stand while pt washed posterior periarea. Pt required MAX A for LB dressing and MAX A for UB ADLS. Pt completed oral care with set- up assist. Pt left seated in w/c with alarm belt activated and all needs within reach.   Therapy Documentation Precautions:  Precautions Precautions: Fall, Other (comment) Precaution Comments: L inattention, L hemiparesis, seizure protocol, impaired R UE coordination, R port a cath Restrictions Weight Bearing Restrictions: No   Pain: Pt reports mild pain in R hand, pt declines need for pain meds. Provided rest breaks and repositioning as pain mgmt strategies.   Therapy/Group: Individual Therapy  Precious Haws 05/17/2021, 12:10 PM

## 2021-05-17 NOTE — Progress Notes (Signed)
Physical Therapy Session Note  Patient Details  Name: Carolyn Russo MRN: [id number] Date of Birth: 05-17-1951  Today's Date: 05/17/2021 PT Individual Time: 1015-1100 PT Individual Time Calculation (min): 45 min   Short Term Goals: Week 2:  PT Short Term Goal 1 (Week 2): =LTG due to ELOS  Skilled Therapeutic Interventions/Progress Updates:    Pt received seated in w/c in room, agreeable to PT session. Pt reports she had soreness in R hand when waking up this AM but it has since improved, no other complaints of pain. Sit to stand with CGA to RW this session. Ambulation 2 x 25 ft with RW and CGA with use of R hand orthosis for improved grip and due to complaints of RUE "fatigue" with gait. Pt reports improvement in RUE fatigue with use of R hand splint with UE strapped in place. Pt remains self-limiting with tasks and states, "I would like to sit down now" after only taking a few steps, requires max encouragement to make it 25 ft x 2. Pt unable to report why she needs to sit and take a break other than feeling fatigued. Static standing balance with one UE support on RW performing reaching outside BOS towards the R for bean bags with RUE and tossing bean bag onto cornhole board, min A for standing balance and to encourage weight shift to the R. Pt left seated in w/c in room with needs in reach, quick release belt and chair alarm in place. Pt falling asleep in w/c at end of session, encouraged patient to remain awake as she has another therapy session and needs to get back on her sleep/wake cycle.  Therapy Documentation Precautions:  Precautions Precautions: Fall, Other (comment) Precaution Comments: L inattention, L hemiparesis, seizure protocol, impaired R UE coordination, R port a cath Restrictions Weight Bearing Restrictions: No   Therapy/Group: Individual Therapy   Excell Seltzer, PT, DPT, CSRS  05/17/2021, 11:59 AM

## 2021-05-17 NOTE — Progress Notes (Signed)
Speech Language Pathology Weekly Progress and Session Note  Patient Details  Name: Carolyn Russo MRN: [id number] Date of Birth: [birth year]  Beginning of progress report period: May 10, 2021 End of progress report period: May 17, 2021  Today's Date: 05/17/2021 SLP Individual Time: 5701-7793 SLP Individual Time Calculation (min): 45 min  Short Term Goals: Week 1: SLP Short Term Goal 1 (Week 1): Pt will utilize overarticulation to achieve intelligibility at the conversational level in mildly distracting environments with supervision verbal cues. SLP Short Term Goal 1 - Progress (Week 1): Met SLP Short Term Goal 2 (Week 1): Pt will consume therapeutic trials of regular textures with supervision cues for use of swallowing precautions over 3 consecutive sessions prior to diet advancement. SLP Short Term Goal 2 - Progress (Week 1): Not met SLP Short Term Goal 3 (Week 1): Pt will use external aids to recall daily information with mod assist verbal cues. SLP Short Term Goal 3 - Progress (Week 1): Met SLP Short Term Goal 4 (Week 1): Pt will complete basic tasks with mod assist verbal cues for functional problem solving. SLP Short Term Goal 4 - Progress (Week 1): Met SLP Short Term Goal 5 (Week 1): Pt will sustain her attention to functional tasks for 10 minute intervals with min assist verbal cues for redirection. SLP Short Term Goal 5 - Progress (Week 1): Not met    New Short Term Goals: Week 2: SLP Short Term Goal 1 (Week 2): STG=LTG due to short ELOS (8/4)  Weekly Progress Updates: Pt made moderate progress meeting 3 out 5 goals, with primary deficit of reduced alertness/attention impacting participation/performance in cognitive tasks. Pt demonstrates inconsistent delayed recall, basic problem solving, and error awareness.  Pt demonstrated 100% intelligibility at conversational level during brief periods of alertness. Pt had consumed regular textured trials demonstrating appropriate  oral/pharyngeal function, howe due to lethargy continued diet of dys 3 textures and thin liquids is recommended.  Pt would continue to benefit from skilled ST services in order to maximize functional independence and reduce burden of care, requiring 24 hour supervision at discharge with continued skilled ST services.      Intensity: Minumum of 1-2 x/day, 30 to 90 minutes Frequency: 3 to 5 out of 7 days Duration/Length of Stay: 8/4 Treatment/Interventions: Cognitive remediation/compensation;Dysphagia/aspiration precaution training;Internal/external aids;Speech/Language facilitation;Therapeutic Activities;Environmental controls;Cueing hierarchy;Functional tasks;Patient/family education   Daily Session  Skilled Therapeutic Interventions: Skilled ST services focused on cognitive skills. Pt's performance continues to be impacted by lethargy requiring cuing to remain awake in 1-2 minute intervals and max A verbal cues in a 5 minute interval. Pt initially dines drifting off to sleep while completing planned task in chair, however 1/2 way through the session requested ice chips to aid in maintaining alertness. SLP facilitated sorting simple change requiring max A verbal cues to correct errors and limited mobility of coins due to long finger nails. Pt required max A verbal cues to identify 3 differences among 2 cards that are similar with ability to recall differences with mod A verbal cues for use of written aid recorded by SLP. Pt was attempting to call husband on Iphone with ability to recall pass-code but unable to attend long enough nor notice errors when typing pass-code. SLP dialed pass-code and pt left voicemail for husband. was left in room with call bell within reach and chair alarm set. SLP recommends to continue skilled services.    General    Pain Pain Assessment Pain Score: 0-No pain  Therapy/Group: Individual Therapy    [facility] 05/17/2021, 4:17 PM

## 2021-05-17 NOTE — Progress Notes (Signed)
PROGRESS NOTE     Subjective/Complaints:  Based on sleep chart, pt slept <4 hours overnight- so won't/refuses (her words) to wake up this AM.   C/O R hand/wrist pain to Renal- on Voltaren gel for this.   ROS: Pt sedated, however notes no pain and didn't sleep last night  Objective:       Physical Exam: Vital Signs    Vitals with BMI 05/17/2021 05/17/2021 05/17/2021  Height - - -  Weight 177 lbs 15 oz - -  BMI 70.26 - -  Systolic 378 588 502  Diastolic 80 54 45  Pulse 74 74 73                  General: sleepy; but woke briefly to stimuli; "grumpy" since didn't sleep; NAD HENT: conjugate gaze; oropharynx moist CV: regular rate; no JVD Pulmonary: CTA B/L; no W/R/R- good air movement GI: soft, NT, ND, (+)BS Psychiatric: appropriate but sleepy Neurological: alert when woke, but sleepy  Musculoskeletal:     No evidence of joint swelling in the elbow wrist MCPs PIPs or DIPs No erythema.  There is no pain with wrist range of motion.  There is no pain to palpation over the wrist area.  No pain to palpation over the MCPs PIPs or DIPs. Skin:    General: Skin is warm and dry. Neurological:    Comments: alert, follows commands.   Fair insight and awareness. Provided biographical information. Normal language, speech slightly slurred. Mild left central 7. LUE 2 to 2+/5 deltoid, 3/5 biceps, triceps and 3+/5 wrist and HI. LLE 3-4/5 prox to distal. RUE 4/5 and RLE 5/5 prox to distal.  Tone is normal in the right upper limb no sensory deficits noted--motor/sensory exam stable today.  also cannot do finger touching to each finger/thumb- not able to do any dexterity movements, or hold pen.   NO change on dexterity this AM   Assessment/Plan: 1. Functional deficits which require 3+ hours per day of interdisciplinary therapy in a comprehensive inpatient rehab setting. Physiatrist is providing close team supervision and 24 hour  management of active medical problems listed below. Physiatrist and rehab team continue to assess barriers to discharge/monitor patient progress toward functional and medical goals   Care Tool:   Bathing   Body parts bathed by patient: Right arm, Left arm, Chest, Abdomen, Front perineal area, Right upper leg, Left upper leg    Body parts bathed by helper: Right lower leg, Left lower leg, Buttocks    Bathing assist Assist Level: Minimal Assistance - Patient > 75%    Upper Body Dressing/Undressing Upper body dressing What is the patient wearing?: Pull over shirt   Upper body assist Assist Level: Minimal Assistance - Patient > 75%   Lower Body Dressing/Undressing Lower body dressing       What is the patient wearing?: Pants, Incontinence brief        Lower body assist Assist for lower body dressing: Minimal Assistance - Patient > 75%     Toileting Toileting   Toileting assist Assist for toileting: Moderate Assistance - Patient 50 - 74%    Transfers Chair/bed transfer   Transfers assist  Chair/bed transfer assist level: Minimal Assistance - Patient > 75%    Locomotion Ambulation     Ambulation assist       Assist level: Minimal Assistance - Patient > 75% Assistive device: Walker-rolling Max distance: 50'    Walk 10 feet activity     Assist     Assist level: Minimal Assistance - Patient > 75% Assistive device: Walker-rolling    Walk 50 feet activity     Assist Walk 50 feet with 2 turns activity did not occur: Safety/medical concerns   Assist level: Minimal Assistance - Patient > 75% Assistive device: Walker-rolling      Walk 150 feet activity     Assist Walk 150 feet activity did not occur: Safety/medical concerns        Walk 10 feet on uneven surface activity     Assist Walk 10 feet on uneven surfaces activity did not occur: Safety/medical concerns         Wheelchair         Assist Will patient use wheelchair at discharge?:   (TBD) Type of Wheelchair: Manual   Wheelchair assist level: Dependent - Patient 0% Max wheelchair distance: 150'      Wheelchair 50 feet with 2 turns activity       Assist           Assist Level: Dependent - Patient 0%    Wheelchair 150 feet activity       Assist       Assist Level: Dependent - Patient 0%     Medical Problem List and Plan: 1.  Left side weakness secondary to nonhemorrhagic right basal ganglia infarction             -patient may shower Reviewed noted 2 day interrupted stay 7/13--7/15 for altered MS , no new CVA identified             con't OT, OT and SLP- will add Ritalin for lethargy and initiation- however due to BP being on high side, will do 2.5 mg with breakfast and lunch.  Continue CIR- PT, OT and SLP- and higher dose of Ritalin 5 mg BID 2.  Impaired mobility: -DVT/anticoagulation:  continue SCD             -antiplatelet therapy:ASA             Plavix restarted. 3. Right hip bursitis: continue Tramadol 50 mg every 12 hours as needed, ice, ROM, lidocaine patch 4. Anxiety/stroke poor initiation: continue Xanax 0.5 mg 3 times daily as needed.  Provide emotional support             7/12- per husband, concerned about flat affect- will d/w possible depression with pt- if so, will start SSRI.             7/20- BP looks OK- will increase to 5 mg BID  7/22- more awake, alert this AM than usual- more initiation- con't regimen- BP controlled for her.              -antipsychotic agents: N/A 5. Neuropsych: This patient is ?capable of making decisions on her own behalf. 6. Skin/Wound Care: Routine skin checks 7. Fluids/Electrolytes/Nutrition: Routine in and outs with follow-up chemistries 8.  Acute lower GI bleed with blood loss anemia.  Follow-up GI services.  Resume ASA as above. - Continue Protonix 40 mg twice daily 7/6: hgb 9.6--monitor for gross bleeding 7/11- last Hb stable at 9.8- will recheck this week  7/12- Hb came back at 9.7- stable-  con't regimen             7/13- Hb up to 11.1- will con't to monitor  7/17- Hb 10.6- con't regimen  7/22- Hb down slightly to 9.3- con't Aranesp.  On Aranesp q Friday for anemia or chronic disease due to ESRD 9.  Chronic diastolic congestive heart failure.  Monitor for any signs of fluid overload.  Continue Lasix 40-80 mg Monday Wednesday Friday             -daily weights, have been stable             7/12- weight bounced up 1 kg today, however was this level 2-3 days ago, so think yesterday's weight might have been slightly off- will monitor  7/22- weight was up yesterday to 86 kg, but down to 80.8 kg today- back to baseline- con't regimen 10.  Newly Dx'd End-stage renal disease.  Hemodialysis as per renal services             7/12- questionable steal syndrome since Fistula placement- will monitor per Vascular- to see if  needs additional intervention  7/19- R hand still cooler to touch- also has decreased dexterity- will likely need EMG after d/c.   7/22- had HD yesterday, not Wednesday when was due- per Renal, will get back on track.   7/25- getting HD today-  11.  Hypertension.  Increase Coreg to 12.5 mg twice daily. Aggressive filtration with ultradialysis tomorrow             7/11- HD today- BP 170s/90s this AM- per renal.             7/12- BP dropped to 70s/50s with HD yesterday- back up to 150s/80s today- con't reigmen  7/17- BP 150s/160s- per renal- will only start 2.5 mg of Ritalin due to BP issues- will monitor closely.  Vitals:   05/17/21 1758 05/17/21 1810  BP: (!) 112/54 140/80  Pulse: 74 74  Resp:  16  Temp:  98 F (36.7 C)  SpO2:  100%    7/25- BP looks the best it has been - 595-638 systolic- con't to monitor, esp since on RItalin 12.  Asthma.  Continue inhalers as directed             - was getting Home O2 prior to admission- will likely need to go home on 2L O2-   Was trying to get at home, but hadn't yet.             7/1- con't nebs prn, home O2 ?PRN only              7/3 lungs very clear today, sats 99% on RA. Some of SOB may be behaviorally related. Reassured pt today that lungs sounded fine.                         -encouraged FV/IS, OOB             7/7: ordered CXT-stable, ordered incentive spirometer 13.Hyperlipidemia.Crestor 14.Obesity.BMI 34.31.Dietery follow up 15.  Diabetes mellitus.hemoglobin AIc 5.2.  CBGs 211-310. Increase Lantus to 13U BID.  7/17- BG's don't look as good- >200- will follow trend before making change yet.   7/20- increased Lantus to 8 units daily- will monitor- increased from 5 units.   7/22- Bgs 139 to 251 in last 24 hours- increase Lantus to 10 units daily  7/25- BG's 163-273- will increase  lantus from 10 units to 13 units- was on 13 units BID_ for some reason it was reduced??? Will see if needs BID if this dose doesn't work well.  CBG (last 3)  Recent Labs    05/16/21 2108 05/17/21 0611 05/17/21 1202  GLUCAP 273* 163* 187*    16. Leukocytosis: trending down, repeat Monday             7/11- last WBC was 13k- will recheck in HD today.              7/13- WBC down to 10.1- will monitor  7/16- Last WBC 12.1- was 12.8 last week- bounced down and went back up again?- con't to monitor, labs in HD  7/22- WBC stable at 12.1- don't see a cause?   7/25- WBC reduced to 10.6- con't to monitor 17. Constipation: KUB shows moderate stool burden. Increase senna-docusate to BID.              7/13- LBM yesterday- con't regimen  7/19- LBM 2-3 days ago- will give a dose of Sorbitol at lunch time. 7/20- will give another dose of Sorbitol since cannot give Mg- early this AM- so might go before HD- KUB shows moderate volume of stool throughout colon- could be cause of LUQ pain   7/22- had huge BM with enema yesterday- con't bowel meds and LUQ pain gone with BM  18. Fistula placement: successful, new fistula accessed without complications.  7/20- possible steal syndrome of RUE- cooler hand on R- might need EMG after d/c.  19. Left shoulder  pain: ordered lidocaine patch and voltaren gel. 20. Lethargy             7/12- is much improved today- will con't to monitor- CT and CXR of chest were stable             7/16- will readd Melatonin for sleep- never had the chance to use it.   7/17- didn't sleep at all- hopefully Ritalin due day 2.5 mg BID- will help- cannot use Amantadine secondary to HD.   7/22- tolerating Ritalin 5 mg BID= doing better   21. Insomnia/mixing of sleep wake cycle  7/20- will try Trazodone 50 mg QHS and see if can get back on good sleep cycle? Encouraged pt/staff to try and keep her awake more during day.   7/21- didn't sleep more than 1 hour overnight- will increase trazodone to 75 mg QHS. More awake this AM  7/22- slept a few hours last night- slightly better- will increase Trazodone to 100 mg at 8pm nightly to reduce sleepiness in AM.  22.  Right upper extremity discomfort, difficult to get a good history.  Today she is complaining more of wrist pain when using the walker.  Her exam is unremarkable no history of trauma.  We will trial some Voltaren gel to the right wrist 4 times per day  7/25- per renal note, still c/o R hand/wrist pain- has been on/off since fistula placed.

## 2021-05-17 NOTE — Progress Notes (Addendum)
[facility] Progress Note   Subjective:  Seen in HD unit, starting treatment. No new events. Says her R hand hurts today.   Objective Vitals:   05/16/21 2131 05/17/21 0317 05/17/21 0500 05/17/21 1427  BP:  (!) 172/78  140/73  Pulse:  82  76  Resp: 20 18  16   Temp:  98.2 F (36.8 C)  98.4 F (36.9 C)  TempSrc:  Oral    SpO2: 99% 99%  100%  Weight:   81.2 kg   Height:       Physical Exam General: Frail woman, NAD. In chair. Heart: RRR; 2/6 murmur Lungs: CTAB Abdomen: soft Extremities: No LE edema Dialysis Access: RUE AVF + thrill  Additional Objective Labs: CBC: Recent Labs  Lab 05/14/21 0454  WBC 12.1*  HGB 9.3*  HCT 27.7*  MCV 87.4  PLT 290    CBG: Recent Labs  Lab 05/16/21 1159 05/16/21 1641 05/16/21 2108 05/17/21 0611 05/17/21 1202  GLUCAP 242* 224* 273* 163* 187*    Studies/Results: No results found.  Medications:   (feeding supplement) PROSource Plus  30 mL Oral BID BM   aspirin EC  81 mg Oral Daily   B-complex with vitamin C  1 tablet Oral Daily   carvedilol  3.125 mg Oral BID WC   Chlorhexidine Gluconate Cloth  6 each Topical BID   cholecalciferol  2,000 Units Oral Daily   clopidogrel  75 mg Oral Daily   darbepoetin (ARANESP) injection - DIALYSIS  100 mcg Intravenous Q Sat-HD   diclofenac Sodium  2 g Topical QID   heparin  5,000 Units Subcutaneous Q8H   insulin aspart  0-6 Units Subcutaneous TID WC   insulin glargine  10 Units Subcutaneous Daily   levETIRAcetam  500 mg Oral QHS   melatonin  3 mg Oral QHS   methylphenidate  5 mg Oral BID WC   mometasone-formoterol  2 puff Inhalation BID   pantoprazole  40 mg Oral Daily   polyethylene glycol  17 g Oral Daily   rosuvastatin  20 mg Oral Daily   senna-docusate  2 tablet Oral BID   sevelamer carbonate  800 mg Oral TID WC   traZODone  100 mg Oral QHS    Dialysis Orders: MWF @ [facility] 4 hrs 180NRe 400/Autoflow 1.5 86.5 kg 2.0K/2.0 Ca TDC, heparin 2000 units bolus - Mircera  100 mcg IV q 2 weeks (last dose 04/14/2021 - Hectorol 5 mcg IV TIW   Assessment/Plan: 1. Acute R subcortical CVA: Back in CIR as of 7/15, transiently back to [facility] 7/14-7/15 with recurrent stroke like symptoms, likely watershed v. hypotensive issue per neuro. 2. Acute LGIB: Hx hemorrhoidal bleeding in past - s/p 2U PRBCs 6/26. S/p EGD/flex sig without clear bleeding source. Plavix and ASA held, now resumed. 3. ESRD: HD MWF. Off schedule last week due to high HD census. Back on schedule Next HD 7/25.  No added heparin. 4. Dialysis Access: S/p 1st stage BVT 7/8 by Dr. Scot Dock - mild steal symptoms, monitoring for now. 5. HTN/volume: Variable BP - trying to avoid intradialytic hypotension, she is well below OP dry weight. 6. Anemia: Hgb 9.3 - continue Aranesp 14mcg q Friday. 7. Secondary hyperparathyroidism: Hectoral on hold for low PTH x 2. Binder changed from Renvela pills to powder d/t issues swallowing pills. Ca/Phos ok for now. 8. Nutrition: Alb low, continue supplements. 9. T2DM: Per primary. 10. Insomnia: Per notes, has been awake at night and sleeping during day - primary trialing ritalin  during day and melatonin at night. Watch BP with stimulants.  Carolyn Child PA-C [facility] 05/17/2021,3:12 PM  Pt seen, examined and agree w A/P as above.  Kelly Splinter  MD 05/17/2021, 3:38 PM

## 2021-05-17 NOTE — Progress Notes (Signed)
Occupational Therapy Session Note  Patient Details  Name: Carolyn Russo MRN: [id number] Date of Birth: January 03, 1951  Today's Date: 05/17/2021 OT Individual Time: 1132-1202 OT Individual Time Calculation (min): 30 min    Short Term Goals: Week 2:  OT Short Term Goal 1 (Week 2): Pt will complete 2/3 toileting tasks with no more than Min A OT Short Term Goal 2 (Week 2): Pt will perform LB dress with Mod A at sit <> stand level OT Short Term Goal 3 (Week 2): Pt will use R hand to self feed with AE PRN with Supervision OT Short Term Goal 4 (Week 2): Pt will consistently perform ADL transfers with CGA and LRAD  Skilled Therapeutic Interventions/Progress Updates:    Pt received asleep in w/c, extensive time spent arousing pt as pt very lethargic upon OTA entrance. Pt transported to gym with total A for time mgmt. Pt completed seated therapeutic activities including catch and release with beach ball to facilitate improved AROM of BUEs and improved GM coordination. Graded task up and utilized coband to secure pts R hand to dowel rod where pt able  to utilize bilateral integration to tap ball back and forth with OTA, noted pts L shoulder continues to present with decreased AROM but able to range pts LUE to full ROM with passive range. Pt transported back to room with total A for time mgmt, pt able to ambulate ~ 10 ft back to room with Rw and CGA. Pt left seated in w/c with all needs within reach and safety belt activated.  Therapy Documentation Precautions:  Precautions Precautions: Fall, Other (comment) Precaution Comments: L inattention, L hemiparesis, seizure protocol, impaired R UE coordination, R port a cath Restrictions Weight Bearing Restrictions: No General:   Vital Signs:  Pain: Pt reports no pain during session.    Therapy/Group: Individual Therapy  Precious Haws 05/17/2021, 12:45 PM

## 2021-05-18 ENCOUNTER — Telehealth: Payer: Medicare Other

## 2021-05-18 LAB — GLUCOSE, CAPILLARY
Glucose-Capillary: 135 mg/dL — ABNORMAL HIGH (ref 70–99)
Glucose-Capillary: 192 mg/dL — ABNORMAL HIGH (ref 70–99)
Glucose-Capillary: 176 mg/dL — ABNORMAL HIGH (ref 70–99)
Glucose-Capillary: 149 mg/dL — ABNORMAL HIGH (ref 70–99)

## 2021-05-18 MED ORDER — DARBEPOETIN ALFA 100 MCG/0.5ML IJ SOSY
100.0000 ug | PREFILLED_SYRINGE | INTRAMUSCULAR | Status: DC
  Filled 2021-05-18: qty 0.5

## 2021-05-18 MED ORDER — CHLORHEXIDINE GLUCONATE CLOTH 2 % EX PADS
6.0000 | MEDICATED_PAD | Freq: Every day | CUTANEOUS | Status: DC
  Administered 2021-05-19 – 2021-05-25 (×2): 6 via TOPICAL

## 2021-05-18 NOTE — Progress Notes (Signed)
Occupational Therapy Session Note  Patient Details  Name: Carolyn Russo MRN: [id number] Date of Birth: 02/14/1951  Today's Date: 05/18/2021 OT Group Time: 1330-1430 OT Group Time Calculation (min): 60 min   Short Term Goals: Week 2:  OT Short Term Goal 1 (Week 2): Pt will complete 2/3 toileting tasks with no more than Min A OT Short Term Goal 2 (Week 2): Pt will perform LB dress with Mod A at sit <> stand level OT Short Term Goal 3 (Week 2): Pt will use R hand to self feed with AE PRN with Supervision OT Short Term Goal 4 (Week 2): Pt will consistently perform ADL transfers with CGA and LRAD  Skilled Therapeutic Interventions/Progress Updates:  Pt was seen for skilled group session with focus of group session on stress management, coping strategies, [facility] and social engagement. Pt actively participated in group session contributing to all conversations. At times, pt difficult to redirect back to topic but pt interested in conversating with participants and was intrigued by group dynamics. Pt noted to be distracted intermittently by extraneous stimuli but overall able to orient pt to group. Pt able to participate in writing current stressors on paper with built up tubing placed on pts pen, pt needed set- up of paper to be modified to a larger piece of paper d/t [facility] deficits. Pt additionally able to manipulate bingo chips during "self care" bingo with RUE. Pt required MOD cues to locate correct items on bingo card d/t impaired attention to task. Pt able to state coping strategies that pt currently uses in order to manage stress and also verbalizes understanding of new coping strategies to incorporate into pts daily routine. Pt transported back to room with total A where pt was left up in recliner with all needs within reach and safety belt activated.    Therapy Documentation Precautions:  Precautions Precautions: Fall, Other (comment) Precaution Comments: L inattention, L hemiparesis, seizure  protocol, impaired R UE coordination, R port a cath Restrictions Weight Bearing Restrictions: No  Pain: Pt reports no pain during group session.    Therapy/Group: Group Therapy  Carolyn Russo 05/18/2021, 4:12 PM

## 2021-05-18 NOTE — Plan of Care (Signed)
  Problem: Consults Goal: Nutrition Consult-if indicated Outcome: Progressing   Problem: RH BOWEL ELIMINATION Goal: RH STG MANAGE BOWEL WITH ASSISTANCE Description: STG Manage Bowel with Mod I Assistance. Outcome: Progressing   Problem: RH SKIN INTEGRITY Goal: RH STG MAINTAIN SKIN INTEGRITY WITH ASSISTANCE Description: STG Maintain Skin Integrity With Mod I Assistance. Outcome: Progressing Goal: RH STG ABLE TO PERFORM INCISION/WOUND CARE W/ASSISTANCE Description: STG Able To Perform Incision/Wound Care With Mod I Assistance. Outcome: Progressing   Problem: RH SAFETY Goal: RH STG ADHERE TO SAFETY PRECAUTIONS W/ASSISTANCE/DEVICE Description: STG Adhere to Safety Precautions With Mod I Assistance/Device. Outcome: Progressing   Problem: RH PAIN MANAGEMENT Goal: RH STG PAIN MANAGED AT OR BELOW PT'S PAIN GOAL Description: < 3 on a 0-10 pain scale. Outcome: Progressing   Problem: RH KNOWLEDGE DEFICIT Goal: RH STG INCREASE KNOWLEDGE OF HYPERTENSION Description: Patient will be able to demonstrate knowledge of HTN medication, BP management, and diet management with educational materials and handouts provided by staff independently at discharge. Outcome: Progressing Goal: RH STG INCREASE KNOWLEGDE OF HYPERLIPIDEMIA Description: Patient will be able to demonstrate knowledge of HLD medications, lab values with educational materials and handouts provided by staff independently at discharge. Outcome: Progressing Goal: RH STG INCREASE KNOWLEDGE OF STROKE PROPHYLAXIS Description: Patient will demonstrate knowledge of medications used to prevent future strokes with educational materials and handouts provided by staff independently at discharge. Outcome: Progressing

## 2021-05-18 NOTE — Progress Notes (Signed)
Physical Therapy Session Note  Patient Details  Name: Carolyn Russo MRN: [id number] Date of Birth: Aug 23, 1951  Today's Date: 05/18/2021 PT Individual Time: 2878-6767 PT Individual Time Calculation (min): 53 min   Short Term Goals: Week 2:  PT Short Term Goal 1 (Week 2): =LTG due to ELOS  Skilled Therapeutic Interventions/Progress Updates:    Pt received seated in w/c in room, agreeable to PT session. No complaints of pain. Sit to stand with CGA throughout session. Ascend/descend 4 x 6" stairs with 2 handrails and min A, max cueing for sequencing. Ascend/descend 4 x 6" stairs laterally with R handrail and min A to simulate home environment, max cueing for sequencing and safety. Pt reports urge to urinate. Toilet transfer with CGA and use of grab bar. Pt incontinent of urine in brief, able to further void on toilet. Pt requires assist for clothing management due to impaired grasp and strength in BUE. Ambulation x 62 ft, x 65 ft with RW and CGA, max encouragement to increase distance. Pt declines further participation following 2nd bout of gait due to fatigue. Pt left seated in w/c in room with needs in reach, quick release belt and chair alarm in place at end of session. Pt missed 7 min of scheduled therapy session due to fatigue.  Therapy Documentation Precautions:  Precautions Precautions: Fall, Other (comment) Precaution Comments: L inattention, L hemiparesis, seizure protocol, impaired R UE coordination, R port a cath Restrictions Weight Bearing Restrictions: No General: PT Amount of Missed Time (min): 7 Minutes PT Missed Treatment Reason: Patient fatigue      Therapy/Group: Individual Therapy   Excell Seltzer, PT, DPT, CSRS  05/18/2021, 5:22 PM

## 2021-05-18 NOTE — Progress Notes (Signed)
Occupational Therapy Session Note  Patient Details  Name: Carolyn Russo MRN: [id number] Date of Birth: 06-Jul-1951  Today's Date: 05/18/2021 OT Individual Time: 3361-2244 OT Individual Time Calculation (min): 60 min    Short Term Goals: Week 2:  OT Short Term Goal 1 (Week 2): Pt will complete 2/3 toileting tasks with no more than Min A OT Short Term Goal 2 (Week 2): Pt will perform LB dress with Mod A at sit <> stand level OT Short Term Goal 3 (Week 2): Pt will use R hand to self feed with AE PRN with Supervision OT Short Term Goal 4 (Week 2): Pt will consistently perform ADL transfers with CGA and LRAD  Skilled Therapeutic Interventions/Progress Updates:  Pt received supine in bed agreeable to OT intervention. Pt currently requires MIN A for bed mobility needing most assist to elevated trunk into sitting. CGA for stand pivot transfers throughout session with RW. Pt transported to sink for bathing and dressing with pt needing set-up for oral care, MOD A for bathing, and MAX A for UB and LB dressing. Pt required MAX A for toileting needing assist for posterior pericare and to manage donning new brief. Pts husband present for end of session with husband stating pt has no prior injury to her L shoulder ( pt has been presenting with impaired AROM of L shoulder). Pt left seated in w/c with all needs within reach and safety belt activated.   Therapy Documentation Precautions:  Precautions Precautions: Fall, Other (comment) Precaution Comments: L inattention, L hemiparesis, seizure protocol, impaired R UE coordination, R port a cath Restrictions Weight Bearing Restrictions: No   Pain: Pt reports pain in R hand, pt declines need for pain meds. Utilized rest breaks and compensatory methods as pain mgmt strategies.   Therapy/Group: Individual Therapy  Precious Haws 05/18/2021, 12:12 PM

## 2021-05-18 NOTE — Patient Care Conference (Signed)
Inpatient RehabilitationTeam Conference and Plan of Care Update Date: 05/18/2021   Time: 11:09 AM    Patient Name: Carolyn Russo      Medical Record Number: [id number]  Date of Birth: [birth year] Sex: Female         Room/Bed: 4W03C/4W03C-01 Payor Info: Payor: MEDICARE / Plan: MEDICARE PART A AND B / Product Type: *No Product type* /    Admit Date/Time:  05/07/2021  4:15 PM  Primary Diagnosis:  Basal ganglia infarction [facility])  Hospital Problems: Principal Problem:   Basal ganglia infarction [facility]) Active Problems:   Cerebrovascular accident (CVA) of right basal ganglia [facility])    Expected Discharge Date: Expected Discharge Date: 05/27/21  Team Members Present: Physician leading conference: Dr. Courtney Heys Social Worker Present: Loralee Pacas, [place] Nurse Present: Dorthula Nettles, RN PT Present: Excell Seltzer, PT OT Present: Lillia Corporal, OT SLP Present: Charolett Bumpers, SLP PPS Coordinator present : Gunnar Fusi, SLP     Current Status/Progress Goal Weekly Team Focus  Bowel/Bladder             Swallow/Nutrition/ Hydration   dys 3 textures and thin liquids, Min A  Supervision A  swallow startegies and regular trials   ADL's   bathing Min A for LUE (can wash buttocks/LB but self limits), LB dress Max, UB dress button up shirts Max, CGA transfers, self feeding Min A w/ R  supervision overall, min A for bathing and toileting  BADL retraining, continue AE, functional transfers, alertness/attending to task, self feeding, family ed/training   Mobility   CGA transfers with RW, gait x 25-100 ft with RW CGA (self-limiting regarding gait), mod A stairs with 2 handrails  Supervision transfers and gait with LRAD; min A stairs  endurance, balance, gait, safety awareness, participation   Communication   min A, reduced alertness  Supervision A  speech intelligibility   Safety/Cognition/ Behavioral Observations  Max-mod A, reduced alertness  Min A  maintain alertness, recall, basic  problem solving and error awareness   Pain             Skin               Discharge Planning:  Pt to d/c to home with husband who intends to hire private aide during the day while he works.   Team Discussion: Very sleepy today. Still on Ritalin and can't increase dosage. Complains of right wrist pain. Incontinent B/B, continue to fill out sleep chart. Fistula to RUE. Patient on target to meet rehab goals: yes, contact guard with RW and added right hand splint. Self-limiting. Supervision goals. Self-limiting with ADL's. Max assist withlower body ADL's, max assist with button up shirts. Min assist goals. SLP reports alertness is poor. Still working on recall and memory.  *See Care Plan and progress notes for long and short-term goals.   Revisions to Treatment Plan:  Started Voltaren gel.  Teaching Needs: Family education, medication management, pain management, bowel/bladder management, skin/wound care, transfer training, gait training, balance training, endurance training, safety awareness.  Current Barriers to Discharge: Decreased caregiver support, Medical stability, Home enviroment access/layout, Incontinence, Wound care, Lack of/limited family support, Weight, Hemodialysis, Weight bearing restrictions, Medication compliance, Behavior, and Nutritional means  Possible Resolutions to Barriers: Continue current medications, provide emotional support.     Medical Summary Current Status: incontinent of B/B; R wrist/hand pain; has sleep chart; has fistula RUE; very sleepy; safety is also an issue;  Barriers to Discharge: Decreased family/caregiver support;Home enviroment access/layout;Hemodialysis;Medical stability;Weight;Incontinence;Other (comments)  Barriers  to Discharge Comments: R hand /wrist pain; R hand splint on RW for R wrist/hand pain- inconsistent with pain- self limiting with therapy- min A bathing.gait- 20-100 ft; LB dressing  max A/and shirt- wants button up  shirts. Possible Resolutions to [employer]: started voltaren gel QID for RUE pain; alertness still issue- on ritalin 5 mg BID- switched days/nights; SLP- min goals-  d/c 8/4   Continued Need for Acute Rehabilitation Level of Care: The patient requires daily medical management by a physician with specialized training in physical medicine and rehabilitation for the following reasons: Direction of a multidisciplinary physical rehabilitation program to maximize functional independence : Yes Medical management of patient stability for increased activity during participation in an intensive rehabilitation regime.: Yes Analysis of laboratory values and/or radiology reports with any subsequent need for medication adjustment and/or medical intervention. : Yes   I attest that I was present, lead the team conference, and concur with the assessment and plan of the team.   Cristi Loron 05/18/2021, 5:06 PM

## 2021-05-18 NOTE — Progress Notes (Signed)
PROGRESS NOTE     Subjective/Complaints:  Pt reports slept well R wrist/hand wasn't hurting this AM.  LBM yesterday- abd pain better since pooping more.   Per nurse, needed assistance to wake up.   ROS:  Pt denies SOB, abd pain, CP, N/V/C/D, and vision changes  Objective:       Physical Exam: Vital Signs    Vitals with BMI 05/18/2021 05/17/2021 05/17/2021  Height - - -  Weight 181 lbs 7 oz - 177 lbs 15 oz  BMI 16.10 - 96.04  Systolic 540 981 191  Diastolic 59 69 80  Pulse 75 78 74                   General: awake, alert, appropriate, sitting up in bed; more awake than yesterday;  NAD HENT: conjugate gaze; oropharynx moist CV: regular rate; no JVD Pulmonary: CTA B/L; no W/R/R- good air movement GI: soft, NT, ND, (+)BS Psychiatric: appropriate Neurological: alert; but still slightly sleepy- delayed responses more notable this AM, but accurate   Musculoskeletal:     No evidence of joint swelling in the elbow wrist MCPs PIPs or DIPs- no Pain/TTP with ROM of R wrist this AM or R hand/fingers  No erythema.  There is no pain with wrist range of motion.  There is no pain to palpation over the wrist area.  No pain to palpation over the MCPs PIPs or DIPs. Skin:    General: Skin is warm and dry. Neurological:    Comments: alert, follows commands.   Fair insight and awareness. Provided biographical information. Normal language, speech slightly slurred. Mild left central 7. LUE 2 to 2+/5 deltoid, 3/5 biceps, triceps and 3+/5 wrist and HI. LLE 3-4/5 prox to distal. RUE 4/5 and RLE 5/5 prox to distal.  Tone is normal in the right upper limb no sensory deficits noted--motor/sensory exam stable today.  also cannot do finger touching to each finger/thumb- not able to do any dexterity movements, or hold pen.   NO change on dexterity this AM   Assessment/Plan: 1. Functional deficits which require 3+ hours per day of  interdisciplinary therapy in a comprehensive inpatient rehab setting. Physiatrist is providing close team supervision and 24 hour management of active medical problems listed below. Physiatrist and rehab team continue to assess barriers to discharge/monitor patient progress toward functional and medical goals   Care Tool:   Bathing   Body parts bathed by patient: Right arm, Left arm, Chest, Abdomen, Front perineal area, Right upper leg, Left upper leg    Body parts bathed by helper: Right lower leg, Left lower leg, Buttocks    Bathing assist Assist Level: Minimal Assistance - Patient > 75%    Upper Body Dressing/Undressing Upper body dressing What is the patient wearing?: Pull over shirt   Upper body assist Assist Level: Minimal Assistance - Patient > 75%   Lower Body Dressing/Undressing Lower body dressing       What is the patient wearing?: Pants, Incontinence brief        Lower body assist Assist for lower body dressing: Minimal Assistance - Patient > 75%     Toileting Toileting   Toileting assist  Assist for toileting: Moderate Assistance - Patient 50 - 74%    Transfers Chair/bed transfer   Transfers assist     Chair/bed transfer assist level: Minimal Assistance - Patient > 75%    Locomotion Ambulation     Ambulation assist       Assist level: Minimal Assistance - Patient > 75% Assistive device: Walker-rolling Max distance: 50'    Walk 10 feet activity     Assist     Assist level: Minimal Assistance - Patient > 75% Assistive device: Walker-rolling    Walk 50 feet activity     Assist Walk 50 feet with 2 turns activity did not occur: Safety/medical concerns   Assist level: Minimal Assistance - Patient > 75% Assistive device: Walker-rolling      Walk 150 feet activity     Assist Walk 150 feet activity did not occur: Safety/medical concerns        Walk 10 feet on uneven surface activity     Assist Walk 10 feet on uneven surfaces activity  did not occur: Safety/medical concerns         Wheelchair         Assist Will patient use wheelchair at discharge?:  (TBD) Type of Wheelchair: Manual   Wheelchair assist level: Dependent - Patient 0% Max wheelchair distance: 150'      Wheelchair 50 feet with 2 turns activity       Assist           Assist Level: Dependent - Patient 0%    Wheelchair 150 feet activity       Assist       Assist Level: Dependent - Patient 0%     Medical Problem List and Plan: 1.  Left side weakness secondary to nonhemorrhagic right basal ganglia infarction             -patient may shower Reviewed noted 2 day interrupted stay 7/13--7/15 for altered MS , no new CVA identified             con't OT, OT and SLP- will add Ritalin for lethargy and initiation- however due to BP being on high side, will do 2.5 mg with breakfast and lunch.  Continue CIR- PT, OT and SLP- con't ritalin at 5 mg BID 2.  Impaired mobility: -DVT/anticoagulation:  continue SCD             -antiplatelet therapy:ASA             Plavix restarted. 3. Right hip bursitis: continue Tramadol 50 mg every 12 hours as needed, ice, ROM, lidocaine patch 4. Anxiety/stroke poor initiation: continue Xanax 0.5 mg 3 times daily as needed.  Provide emotional support             7/12- per husband, concerned about flat affect- will d/w possible depression with pt- if so, will start SSRI.             7/20- BP looks OK- will increase to 5 mg BID  7/22- more awake, alert this AM than usual- more initiation- con't regimen- BP controlled for her. 7/26- more awake today (better than yesterday) slept better- con't regimen              -antipsychotic agents: N/A 5. Neuropsych: This patient is ?capable of making decisions on her own behalf. 6. Skin/Wound Care: Routine skin checks 7. Fluids/Electrolytes/Nutrition: Routine in and outs with follow-up chemistries 8.  Acute lower GI bleed with blood loss anemia.  Follow-up GI services.  Resume ASA  as above. - Continue Protonix 40 mg twice daily 7/6: hgb 9.6--monitor for gross bleeding 7/11- last Hb stable at 9.8- will recheck this week             7/12- Hb came back at 9.7- stable- con't regimen             7/13- Hb up to 11.1- will con't to monitor  7/17- Hb 10.6- con't regimen  7/22- Hb down slightly to 9.3- con't Aranesp.  On Aranesp q Friday for anemia or chronic disease due to ESRD 9.  Chronic diastolic congestive heart failure.  Monitor for any signs of fluid overload.  Continue Lasix 40-80 mg Monday Wednesday Friday             -daily weights, have been stable             7/12- weight bounced up 1 kg today, however was this level 2-3 days ago, so think yesterday's weight might have been slightly off- will monitor  7/22- weight was up yesterday to 86 kg, but down to 80.8 kg today- back to baseline- con't regimen 10.  Newly Dx'd End-stage renal disease.  Hemodialysis as per renal services             7/12- questionable steal syndrome since Fistula placement- will monitor per Vascular- to see if  needs additional intervention  7/19- R hand still cooler to touch- also has decreased dexterity- will likely need EMG after d/c.   7/22- had HD yesterday, not Wednesday when was due- per Renal, will get back on track.  11.  Hypertension.  Increase Coreg to 12.5 mg twice daily. Aggressive filtration with ultradialysis tomorrow             7/11- HD today- BP 170s/90s this AM- per renal.             7/12- BP dropped to 70s/50s with HD yesterday- back up to 150s/80s today- con't reigmen  7/17- BP 150s/160s- per renal- will only start 2.5 mg of Ritalin due to BP issues- will monitor closely.  Vitals:   05/17/21 2108 05/18/21 0541  BP: 136/69 (!) 169/59  Pulse: 78 75  Resp: 18 18  Temp: (!) 97.4 F (36.3 C) 98.5 F (36.9 C)  SpO2: 99% 100%    7/25- BP looks the best it has been - 267-124 systolic- con't to monitor, esp since on Ritalin  7/26- BP 580 to 998 systolic in last 24 hours- will  con't to monitor- per renal 12.  Asthma.  Continue inhalers as directed             - was getting Home O2 prior to admission- will likely need to go home on 2L O2-   Was trying to get at home, but hadn't yet.             7/1- con't nebs prn, home O2 ?PRN only             7/3 lungs very clear today, sats 99% on RA. Some of SOB may be behaviorally related. Reassured pt today that lungs sounded fine.                         -encouraged FV/IS, OOB             7/7: ordered CXT-stable, ordered incentive spirometer 13.Hyperlipidemia.Crestor 14.Obesity.BMI 34.31.Dietery follow up 15.  Diabetes mellitus.hemoglobin AIc 5.2.  CBGs 211-310. Increase Lantus to 13U  BID.  7/17- BG's don't look as good- >200- will follow trend before making change yet.   7/20- increased Lantus to 8 units daily- will monitor- increased from 5 units.   7/22- Bgs 139 to 251 in last 24 hours- increase Lantus to 10 units daily  7/25- BG's 163-273- will increase lantus from 10 units to 13 units- was on 13 units BID_ for some reason it was reduced??? Will see if needs BID if this dose doesn't work well.   7/26- BG's -hasn't had a chance to get better- just got AM dose today- first dose of increased dose. Will monitor CBG (last 3)  Recent Labs    05/17/21 1202 05/17/21 2156 05/18/21 0638  GLUCAP 187* 200* 135*    16. Leukocytosis: trending down, repeat Monday             7/11- last WBC was 13k- will recheck in HD today.              7/13- WBC down to 10.1- will monitor  7/16- Last WBC 12.1- was 12.8 last week- bounced down and went back up again?- con't to monitor, labs in HD  7/22- WBC stable at 12.1- don't see a cause?   7/25- WBC reduced to 10.6- con't to monitor 17. Constipation: KUB shows moderate stool burden. Increase senna-docusate to BID.              7/13- LBM yesterday- con't regimen  7/19- LBM 2-3 days ago- will give a dose of Sorbitol at lunch time. 7/20- will give another dose of Sorbitol since cannot give Mg-  early this AM- so might go before HD- KUB shows moderate volume of stool throughout colon- could be cause of LUQ pain   7/22- had huge BM with enema yesterday- con't bowel meds and LUQ pain gone with BM  18. Fistula placement: successful, new fistula accessed without complications.  7/20- possible steal syndrome of RUE- cooler hand on R- might need EMG after d/c.  19. Left shoulder pain: ordered lidocaine patch and voltaren gel. 20. Lethargy             7/12- is much improved today- will con't to monitor- CT and CXR of chest were stable             7/16- will readd Melatonin for sleep- never had the chance to use it.   7/17- didn't sleep at all- hopefully Ritalin due day 2.5 mg BID- will help- cannot use Amantadine secondary to HD.   7/22- tolerating Ritalin 5 mg BID= doing better   21. Insomnia/mixing of sleep wake cycle  7/20- will try Trazodone 50 mg QHS and see if can get back on good sleep cycle? Encouraged pt/staff to try and keep her awake more during day.   7/21- didn't sleep more than 1 hour overnight- will increase trazodone to 75 mg QHS. More awake this AM  7/22- slept a few hours last night- slightly better- will increase Trazodone to 100 mg at 8pm nightly to reduce sleepiness in AM.  22.  Right upper extremity discomfort, difficult to get a good history.  Today she is complaining more of wrist pain when using the walker.  Her exam is unremarkable no history of trauma.  We will trial some Voltaren gel to the right wrist 4 times per day  7/25- per renal note, still c/o R hand/wrist pain- has been on/off since fistula placed.   7/26- denies pain this AM

## 2021-05-18 NOTE — Progress Notes (Signed)
[facility] Progress Note   Subjective:   Patient seen and examined at bedside.  Sitting in wheelchair, eating breakfast.  Reports some cramping with HD.  No other specific complaints.  Denies CP, SOB, n/v/d and abdominal pain.  Objective Vitals:   05/17/21 1758 05/17/21 1810 05/17/21 2108 05/18/21 0541  BP: (!) 112/54 140/80 136/69 (!) 169/59  Pulse: 74 74 78 75  Resp:  16 18 18   Temp:  98 F (36.7 C) (!) 97.4 F (36.3 C) 98.5 F (36.9 C)  TempSrc:  Oral  Oral  SpO2:  100% 99% 100%  Weight:  80.7 kg  82.3 kg  Height:       Physical Exam General:frail appearing female in NAD Heart:RRR, +2/6 murmur Lungs:CTAB, nml WOB on RA Abdomen:soft, NTND Extremities:no LE edema Dialysis Access: RU AVF +t/b   Filed Weights   05/17/21 1512 05/17/21 1810 05/18/21 0541  Weight: 81.7 kg 80.7 kg 82.3 kg    Intake/Output Summary (Last 24 hours) at 05/18/2021 1249 Last data filed at 05/17/2021 2050 Gross per 24 hour  Intake 477 ml  Output 1000 ml  Net -523 ml    Additional Objective Labs: Basic Metabolic Panel: Recent Labs  Lab 05/17/21 1444  NA 135  K 4.7  CL 98  CO2 29  GLUCOSE 244*  BUN 28*  CREATININE 5.52*  CALCIUM 9.4  PHOS 3.7   CBC: Recent Labs  Lab 05/14/21 0454 05/17/21 1444  WBC 12.1* 10.6*  HGB 9.3* 9.8*  HCT 27.7* 29.2*  MCV 87.4 88.5  PLT 290 297    CBG: Recent Labs  Lab 05/17/21 0611 05/17/21 1202 05/17/21 2156 05/18/21 0638 05/18/21 1117  GLUCAP 163* 187* 200* 135* 192*    Medications:   (feeding supplement) PROSource Plus  30 mL Oral BID BM   aspirin EC  81 mg Oral Daily   B-complex with vitamin C  1 tablet Oral Daily   carvedilol  3.125 mg Oral BID WC   Chlorhexidine Gluconate Cloth  6 each Topical BID   cholecalciferol  2,000 Units Oral Daily   clopidogrel  75 mg Oral Daily   darbepoetin (ARANESP) injection - DIALYSIS  100 mcg Intravenous Q Sat-HD   diclofenac Sodium  2 g Topical QID   heparin  5,000 Units  Subcutaneous Q8H   insulin aspart  0-6 Units Subcutaneous TID WC   insulin glargine  13 Units Subcutaneous Daily   levETIRAcetam  500 mg Oral QHS   melatonin  3 mg Oral QHS   methylphenidate  5 mg Oral BID WC   mometasone-formoterol  2 puff Inhalation BID   pantoprazole  40 mg Oral Daily   polyethylene glycol  17 g Oral Daily   rosuvastatin  20 mg Oral Daily   senna-docusate  2 tablet Oral BID   sevelamer carbonate  800 mg Oral TID WC   traZODone  100 mg Oral QHS    Dialysis Orders: MWF @ [facility] 4 hrs 180NRe 400/Autoflow 1.5 86.5 kg 2.0K/2.0 Ca TDC, heparin 2000 units bolus - Mircera 100 mcg IV q 2 weeks (last dose 04/14/2021 - Hectorol 5 mcg IV TIW   Assessment/Plan: 1. Acute R subcortical CVA: Back in CIR as of 7/15, transiently back to [facility] 7/14-7/15 with recurrent stroke like symptoms, likely watershed v. hypotensive issue per neuro. 2. Acute LGIB: Hx hemorrhoidal bleeding in past - s/p 2U PRBCs 6/26. S/p EGD/flex sig without clear bleeding source. Plavix and ASA held, now resumed. 3. ESRD: HD MWF. Orders  written for HD tomorrow per regular schedule. No added heparin. 4. Dialysis Access: S/p 1st stage BVT 7/8 by Dr. Scot Dock - mild steal symptoms, monitoring for now. 5. HTN/volume: Variable BP - trying to avoid intradialytic hypotension, she is well below OP dry weight.  UF as tolerated.  6. Anemia: Hgb^9.8 - continue Aranesp 178mcg q Friday. 7. Secondary hyperparathyroidism: Hectoral on hold for low PTH x 2. Binder changed from Renvela pills to powder d/t issues swallowing pills. Ca/Phos ok for now. 8. Nutrition: Alb low, continue supplements. 9. T2DM: Per primary. 10. Insomnia: Per notes, has been awake at night and sleeping during day - primary trialing ritalin during day and melatonin at night. Watch BP with stimulants.  Jen Mow, PA-C [facility] 05/18/2021,12:49 PM  LOS: 11 days

## 2021-05-18 NOTE — Progress Notes (Signed)
Occupational Therapy Session Note  Patient Details  Name: Carolyn Russo MRN: [id number] Date of Birth: 1951/05/30  Today's Date: 05/18/2021 OT Individual Time: 5374-8270 OT Individual Time Calculation (min): 24 min    Short Term Goals: Week 1:  OT Short Term Goal 1 (Week 1): Pt will complete 2/3 steps of toileting with mod A. OT Short Term Goal 1 - Progress (Week 1): Met OT Short Term Goal 2 (Week 1): Pt will complete standing ADL with min A. OT Short Term Goal 2 - Progress (Week 1): Progressing toward goal OT Short Term Goal 3 (Week 1): Pt will don shirt with MIN A OT Short Term Goal 3 - Progress (Week 1): Progressing toward goal OT Short Term Goal 4 (Week 1): Pt will recall hemi dressing strategies with min VC OT Short Term Goal 4 - Progress (Week 1): Met Week 2:  OT Short Term Goal 1 (Week 2): Pt will complete 2/3 toileting tasks with no more than Min A OT Short Term Goal 2 (Week 2): Pt will perform LB dress with Mod A at sit <> stand level OT Short Term Goal 3 (Week 2): Pt will use R hand to self feed with AE PRN with Supervision OT Short Term Goal 4 (Week 2): Pt will consistently perform ADL transfers with CGA and LRAD   Skilled Therapeutic Interventions/Progress Updates:    Pt greeted at time of session sitting up in wheelchair sleeping but easily aroused, agreeable to OT session, no pain reported. Declined toileting/ADL tasks. Pt wanting to work on "her hands" especially the L one. Retrieved yellow grade theraputty and pt education provided on FMC/GMC/strengthening activities for the pt to perform in her room without OT to assist including the following: putty squeezes, flattening on firm surface, and rolling into a log. Pt performing each of these with B hands 1x5-10 each. NT entered at this time with breakfast tray, hand off to NT. Up in chair alarm on call bell in reach.   Therapy Documentation Precautions:  Precautions Precautions: Fall, Other (comment) Precaution Comments:  L inattention, L hemiparesis, seizure protocol, impaired R UE coordination, R port a cath Restrictions Weight Bearing Restrictions: No    Therapy/Group: Individual Therapy  [name] 05/18/2021, 7:17 AM

## 2021-05-18 NOTE — Progress Notes (Signed)
Patient ID: Carolyn Russo, female   DOB: 1951-08-12, 70 y.o.   MRN: [id number]  SW made efforts to meet with pt but pt would not respond to SW. Pt was sleeping. SW will continue to make efforts to provide updates.   1400-SW spoke with [name] [phone]) to provide updates from team conference, and no change in pt discharge date. Family edu scheduled for Friday (7/29) 8am-9:30am. Husband expressed concerns related to pt pain issues she has been dealing with. SW to relay concerns to medical team. SW will continue to provide updates after team conference.   Loralee Pacas, MSW, [place] Office: [phone] Cell: [phone] Fax: [phone]

## 2021-05-19 ENCOUNTER — Ambulatory Visit: Payer: Medicare Other | Admitting: Nurse Practitioner

## 2021-05-19 LAB — GLUCOSE, CAPILLARY
Glucose-Capillary: 103 mg/dL — ABNORMAL HIGH (ref 70–99)
Glucose-Capillary: 202 mg/dL — ABNORMAL HIGH (ref 70–99)
Glucose-Capillary: 169 mg/dL — ABNORMAL HIGH (ref 70–99)
Glucose-Capillary: 119 mg/dL — ABNORMAL HIGH (ref 70–99)

## 2021-05-19 NOTE — Progress Notes (Signed)
Occupational Therapy Session Note  Patient Details  Name: Carolyn Russo MRN: [id number] Date of Birth: 1951-01-02  Today's Date: 05/19/2021 OT Individual Time: 1052-1207 OT Individual Time Calculation (min): 75 min    Short Term Goals: Week 2:  OT Short Term Goal 1 (Week 2): Pt will complete 2/3 toileting tasks with no more than Min A OT Short Term Goal 2 (Week 2): Pt will perform LB dress with Mod A at sit <> stand level OT Short Term Goal 3 (Week 2): Pt will use R hand to self feed with AE PRN with Supervision OT Short Term Goal 4 (Week 2): Pt will consistently perform ADL transfers with CGA and LRAD  Skilled Therapeutic Interventions/Progress Updates:  Pt received seated in w/c agreeable to OT intervention. Pt transported to therapy gym with total A where pt completed BITS to facilitate improved functional reach with LUE and improved GM coordination with RUE. Pt completed task for 2 mins where pt was instructed to reach for dots on screen, pt needed active assist on L side to reach dots, pt with impaired accuracy with RUE and noted to have moments of inattention on L side needing cues to attend to dots on L side of visual field. Pt completed seated task of stacking cones with RUE with good accuracy  and improved FMC grasp, graded task up and had pt complete functional reach from standing but this time with retrieving clothes pins using RUE, pt only able to grasp level 1 clothes pins and requested seated rest break after ~ 3 mins. Pt reports need to void bladder, MOD A for 3/3 toileting tasks and MIN A for stand pivot transfer to toilet. Finished session working on [facility] skills such as manipulation translation of playing cards, instructed pt on matching "go fish cards" with pt needing MOD cues to sequence task and noted to present with L inattention. Pt transported back to room with total A where pt was left up in w/c with safety belt activated and all needs within reach.   Therapy  Documentation Precautions:  Precautions Precautions: Fall, Other (comment) Precaution Comments: L inattention, L hemiparesis, seizure protocol, impaired R UE coordination, R port a cath Restrictions Weight Bearing Restrictions: No General:   Vital Signs:  Pain: Pt reports pain in L shoulder during session, provided rest breaks, repositioning and compensatory methods as pain mgmt strategies.    Therapy/Group: Individual Therapy  Corinne Ports [facility] 05/19/2021, 12:30 PM

## 2021-05-19 NOTE — Progress Notes (Addendum)
Physical Therapy Session Note  Patient Details  Name: Carolyn Russo MRN: [id number] Date of Birth: 1951-08-24  Today's Date: 05/19/2021 PT Individual Time: 1005-1030 and 830-900 PT Individual Time Calculation (min): 25 min and 30 min  Short Term Goals: Week 1:  PT Short Term Goal 1 (Week 1): Pt will perform supine<>sit with min assist PT Short Term Goal 1 - Progress (Week 1): Met PT Short Term Goal 2 (Week 1): Pt will perform sit<>stands using LRAD with CGA PT Short Term Goal 2 - Progress (Week 1): Met PT Short Term Goal 3 (Week 1): Pt will perform bed<>chair transfers using LRAD with CGA PT Short Term Goal 3 - Progress (Week 1): Met PT Short Term Goal 4 (Week 1): Pt will ambulate at least 54f using LRAD with min assist PT Short Term Goal 4 - Progress (Week 1): Met PT Short Term Goal 5 (Week 1): Pt will ascend/descend 4 steps using B HRs with min assist Week 2:  PT Short Term Goal 1 (Week 2): =LTG due to ELOS  Skilled Therapeutic Interventions/Progress Updates:    Pain:  Pt reports no pain.  Treatment to tolerance.  Rest breaks and repositioning as needed.  Pt initially eating breakfast, 30 min missed time due to this.  Therapist returned at 8:30 and pt finishing, breakfast.  Pt donns pants w/max assist, rolls for raising w/supervision.  Supine to sit w/min assist.  Scoots in sitting very slowly but w/sueprvision.  Sit to stand to RW w/mod assist and gait x 10 feet + commode transfer w/min assist.  Pt dependent for clothing management, continent of urine, performs hygiene w/set up.  Sit to stand from commode/BSC over commode w/cues for hand placement, min assist.  Gait x 154fto wc w/cga to min assist, cues for safety w/RW.  Turn/sit to wc w/cues for safety, cga. Pt assitsted to sink and washes hands and rinses mouth w/set up. Pt left oob in wc w/alarm belt set and needs in reach  SECOND SESSION makeup session from earlier in am Pt initially oob in wc and agrreable to gait training.  Pt  transported to hallway in wc.  Sit to stand w/cga, cues for placement of R hand in orthosis, Gait [id number] turning at [id number] following w/wc.  Gait w/RW w/R hand orthosis cga for straight path, mod assist for AD management w/turning, mod cues for safety w/backing/stand to sit to wc, encouragement to increase gait distance.. At end of session, NT and housekeeping in room cleaning.  Pt left sitting in hallway at doorway to room in wc w/husband seated at side and NT agreeable to returning pt to room/bedside when completed.      Therapy Documentation Precautions:  Precautions Precautions: Fall, Other (comment) Precaution Comments: L inattention, L hemiparesis, seizure protocol, impaired R UE coordination, R port a cath Restrictions Weight Bearing Restrictions: No     Therapy/Group: Individual Therapy BarbaCallie Fielding  [place]/2022, 12:35 PM

## 2021-05-19 NOTE — Progress Notes (Signed)
PROGRESS NOTE     Subjective/Complaints:  Pt  reports LBM yesterday-  R hand is better, but hand is still cooler than L hand.  No complaints.    ROS:  Pt denies SOB, abd pain, CP, N/V/C/D, and vision changes  Objective:       Physical Exam: Vital Signs    Vitals with BMI 05/19/2021 05/19/2021 05/18/2021  Height - - -  Weight - - -  BMI - - -  Systolic 882 800 349  Diastolic 72 62 69  Pulse 76 74 72                    General: awake, alert, appropriate, sitting up in bed; nurse in room;  NAD HENT: conjugate gaze; oropharynx moist CV: regular rate; no JVD Pulmonary: CTA B/L; no W/R/R- good air movement GI: soft, NT, ND, (+)BS Psychiatric: appropriate; still delayed responses, but actually joking some Neurological: Alert; delayed, but answering questions appropriately.   Musculoskeletal:      no Pain/TTP with ROM of R wrist this AM or R hand/fingers- R hand very slightly cooler than L hand; trace swelling on dorsum of R wrist which is new. No erythema.   No erythema.  There is no pain with wrist range of motion.  There is no pain to palpation over the wrist area.  No pain to palpation over the MCPs PIPs or DIPs. Skin:    General: Skin is warm and dry. Neurological:    Comments: alert, follows commands.   Fair insight and awareness. Provided biographical information. Normal language, speech slightly slurred. Mild left central 7. LUE 2 to 2+/5 deltoid, 3/5 biceps, triceps and 3+/5 wrist and HI. LLE 3-4/5 prox to distal. RUE 4/5 and RLE 5/5 prox to distal.  Tone is normal in the right upper limb no sensory deficits noted--motor/sensory exam stable today.  also cannot do finger touching to each finger/thumb- not able to do any dexterity movements, or hold pen.   NO change on dexterity this AM   Assessment/Plan: 1. Functional deficits which require 3+ hours per day of interdisciplinary therapy in a  comprehensive inpatient rehab setting. Physiatrist is providing close team supervision and 24 hour management of active medical problems listed below. Physiatrist and rehab team continue to assess barriers to discharge/monitor patient progress toward functional and medical goals   Care Tool:   Bathing   Body parts bathed by patient: Right arm, Left arm, Chest, Abdomen, Front perineal area, Right upper leg, Left upper leg    Body parts bathed by helper: Right lower leg, Left lower leg, Buttocks    Bathing assist Assist Level: Minimal Assistance - Patient > 75%    Upper Body Dressing/Undressing Upper body dressing What is the patient wearing?: Pull over shirt   Upper body assist Assist Level: Minimal Assistance - Patient > 75%   Lower Body Dressing/Undressing Lower body dressing       What is the patient wearing?: Pants, Incontinence brief        Lower body assist Assist for lower body dressing: Minimal Assistance - Patient > 75%     Toileting Toileting   Toileting assist Assist for toileting:  Moderate Assistance - Patient 50 - 74%    Transfers Chair/bed transfer   Transfers assist     Chair/bed transfer assist level: Minimal Assistance - Patient > 75%    Locomotion Ambulation     Ambulation assist       Assist level: Minimal Assistance - Patient > 75% Assistive device: Walker-rolling Max distance: 50'    Walk 10 feet activity     Assist     Assist level: Minimal Assistance - Patient > 75% Assistive device: Walker-rolling    Walk 50 feet activity     Assist Walk 50 feet with 2 turns activity did not occur: Safety/medical concerns   Assist level: Minimal Assistance - Patient > 75% Assistive device: Walker-rolling      Walk 150 feet activity     Assist Walk 150 feet activity did not occur: Safety/medical concerns        Walk 10 feet on uneven surface activity     Assist Walk 10 feet on uneven surfaces activity did not occur: Safety/medical  concerns         Wheelchair         Assist Will patient use wheelchair at discharge?:  (TBD) Type of Wheelchair: Manual   Wheelchair assist level: Dependent - Patient 0% Max wheelchair distance: 150'      Wheelchair 50 feet with 2 turns activity       Assist           Assist Level: Dependent - Patient 0%    Wheelchair 150 feet activity       Assist       Assist Level: Dependent - Patient 0%     Medical Problem List and Plan: 1.  Left side weakness secondary to nonhemorrhagic right basal ganglia infarction             -patient may shower Reviewed noted 2 day interrupted stay 7/13--7/15 for altered MS , no new CVA identified             con't OT, OT and SLP- will add Ritalin for lethargy and initiation- however due to BP being on high side, will do 2.5 mg with breakfast and lunch.  Continue CIR- PT, OT and SLP- con't Ritalin 5 mg BID for now 2.  Impaired mobility: -DVT/anticoagulation:  continue SCD             -antiplatelet therapy:ASA             Plavix restarted. 3. Right hip bursitis: continue Tramadol 50 mg every 12 hours as needed, ice, ROM, lidocaine patch  7/27- R wrist pain better- con't regimen 4. Anxiety/stroke poor initiation: continue Xanax 0.5 mg 3 times daily as needed.  Provide emotional support             7/12- per husband, concerned about flat affect- will d/w possible depression with pt- if so, will start SSRI.             7/20- BP looks OK- will increase to 5 mg BID  7/22- more awake, alert this AM than usual- more initiation- con't regimen- BP controlled for her. 7/26- more awake today (better than yesterday) slept better- con't regimen               -antipsychotic agents: N/A 5. Neuropsych: This patient is ?capable of making decisions on her own behalf. 6. Skin/Wound Care: Routine skin checks 7. Fluids/Electrolytes/Nutrition: Routine in and outs with follow-up chemistries 8.  Acute lower GI bleed with  blood loss anemia.  Follow-up GI  services.  Resume ASA as above. - Continue Protonix 40 mg twice daily 7/6: hgb 9.6--monitor for gross bleeding 7/11- last Hb stable at 9.8- will recheck this week             7/12- Hb came back at 9.7- stable- con't regimen             7/13- Hb up to 11.1- will con't to monitor  7/17- Hb 10.6- con't regimen  7/22- Hb down slightly to 9.3- con't Aranesp.  On Aranesp q Friday for anemia or chronic disease due to ESRD  7/27- will see if can get labs in HD? Since she's a very hard stick otherwise.  9.  Chronic diastolic congestive heart failure.  Monitor for any signs of fluid overload.  Continue Lasix 40-80 mg Monday Wednesday Friday             -daily weights, have been stable             7/12- weight bounced up 1 kg today, however was this level 2-3 days ago, so think yesterday's weight might have been slightly off- will monitor  7/22- weight was up yesterday to 86 kg, but down to 80.8 kg today- back to baseline- con't regimen 10.  Newly Dx'd End-stage renal disease.  Hemodialysis as per renal services             7/12- questionable steal syndrome since Fistula placement- will monitor per Vascular- to see if  needs additional intervention  7/19- R hand still cooler to touch- also has decreased dexterity- will likely need EMG after d/c.   7/22- had HD yesterday, not Wednesday when was due- per Renal, will get back on track.  11.  Hypertension.  Increase Coreg to 12.5 mg twice daily. Aggressive filtration with ultradialysis tomorrow             7/11- HD today- BP 170s/90s this AM- per renal.             7/12- BP dropped to 70s/50s with HD yesterday- back up to 150s/80s today- con't reigmen  7/17- BP 150s/160s- per renal- will only start 2.5 mg of Ritalin due to BP issues- will monitor closely.  Vitals:   05/19/21 0405 05/19/21 1453  BP: (!) 145/62 (!) 159/72  Pulse: 74 76  Resp: 16 16  Temp: 98.4 F (36.9 C)   SpO2: 97% 100%    7/25- BP looks the best it has been - 673-419 systolic- con't  to monitor, esp since on Ritalin  7/26- BP 379 to 024 systolic in last 24 hours- will con't to monitor- per renal  7/26- BP 140s-150s-systolic- con't regimen 12.  Asthma.  Continue inhalers as directed             - was getting Home O2 prior to admission- will likely need to go home on 2L O2-   Was trying to get at home, but hadn't yet.             7/1- con't nebs prn, home O2 ?PRN only             7/3 lungs very clear today, sats 99% on RA. Some of SOB may be behaviorally related. Reassured pt today that lungs sounded fine.                         -encouraged FV/IS, OOB  7/7: ordered CXT-stable, ordered incentive spirometer 13.Hyperlipidemia.Crestor 14.Obesity.BMI 34.31.Dietery follow up 15.  Diabetes mellitus.hemoglobin AIc 5.2.  CBGs 211-310. Increase Lantus to 13U BID.  7/17- BG's don't look as good- >200- will follow trend before making change yet.   7/20- increased Lantus to 8 units daily- will monitor- increased from 5 units.   7/22- Bgs 139 to 251 in last 24 hours- increase Lantus to 10 units daily  7/25- BG's 163-273- will increase lantus from 10 units to 13 units- was on 13 units BID_ for some reason it was reduced??? Will see if needs BID if this dose doesn't work well.   7/26- BG's -hasn't had a chance to get better- just got AM dose today- first dose of increased dose. Will monitor  7/27- BG's 103-202- much improved- had been on Lantus 13 units BID when got here- so I'm not sure what changed, when I was on vacation?not clear- but don't feel comfortable increasing since lows low 100s-  CBG (last 3)  Recent Labs    05/19/21 0640 05/19/21 1205 05/19/21 1626  GLUCAP 103* 202* 169*    16. Leukocytosis: trending down, repeat Monday             7/11- last WBC was 13k- will recheck in HD today.              7/13- WBC down to 10.1- will monitor  7/16- Last WBC 12.1- was 12.8 last week- bounced down and went back up again?- con't to monitor, labs in HD  7/22- WBC stable at  12.1- don't see a cause?   7/25- WBC reduced to 10.6- con't to monitor 17. Constipation: KUB shows moderate stool burden. Increase senna-docusate to BID.              7/13- LBM yesterday- con't regimen  7/19- LBM 2-3 days ago- will give a dose of Sorbitol at lunch time. 7/20- will give another dose of Sorbitol since cannot give Mg- early this AM- so might go before HD- KUB shows moderate volume of stool throughout colon- could be cause of LUQ pain   7/22- had huge BM with enema yesterday- con't bowel meds and LUQ pain gone with BM  7/27- LBM yesterday- con't regimen  18. Fistula placement: successful, new fistula accessed without complications.  7/20- possible steal syndrome of RUE- cooler hand on R- might need EMG after d/c.  19. Left shoulder pain: ordered lidocaine patch and voltaren gel. 20. Lethargy             7/12- is much improved today- will con't to monitor- CT and CXR of chest were stable             7/16- will readd Melatonin for sleep- never had the chance to use it.   7/17- didn't sleep at all- hopefully Ritalin due day 2.5 mg BID- will help- cannot use Amantadine secondary to HD.   7/22- tolerating Ritalin 5 mg BID= doing better   21. Insomnia/mixing of sleep wake cycle  7/20- will try Trazodone 50 mg QHS and see if can get back on good sleep cycle? Encouraged pt/staff to try and keep her awake more during day.   7/21- didn't sleep more than 1 hour overnight- will increase trazodone to 75 mg QHS. More awake this AM  7/22- slept a few hours last night- slightly better- will increase Trazodone to 100 mg at 8pm nightly to reduce sleepiness in AM.  22.  Right upper extremity discomfort, difficult to get a  good history.  Today she is complaining more of wrist pain when using the walker.  Her exam is unremarkable no history of trauma.  We will trial some Voltaren gel to the right wrist 4 times per day  7/25- per renal note, still c/o R hand/wrist pain- has been on/off since fistula  placed.   7/27- denies pain- con't regimen

## 2021-05-19 NOTE — Progress Notes (Signed)
Patient ID: Carolyn Russo, female   DOB: 05/09/1951, 70 y.o.   MRN: [id number]  SW sent HHPT/OT/SLP/aide order to [facility].   Loralee Pacas, MSW, [place] Office: [phone] Cell: [phone] Fax: [phone]

## 2021-05-19 NOTE — Progress Notes (Signed)
[facility] Progress Note   Subjective:   Patient seen and examined in room.  Sitting up in wheelchair, eating cereal.  Working with speech therapy.  Denies CP, SOB, n/v/d and edema.   Objective Vitals:   05/18/21 1456 05/18/21 1958 05/18/21 2048 05/19/21 0405  BP: 110/89 137/69  (!) 145/62  Pulse: 73 72  74  Resp: 16 18  16   Temp: 98.5 F (36.9 C) 98.4 F (36.9 C)  98.4 F (36.9 C)  TempSrc: Oral Oral    SpO2: 100% 98% 98% 97%  Weight:      Height:       Physical Exam General:well appearing female in NAD Heart:RRR, +0/2 systolic Lungs:CTAB, nml WOB on RA Abdomen:soft, NTND Extremities:no LE edema Dialysis Access: [facility], RU AVF maturing   Filed Weights   05/17/21 1512 05/17/21 1810 05/18/21 0541  Weight: 81.7 kg 80.7 kg 82.3 kg    Intake/Output Summary (Last 24 hours) at 05/19/2021 1115 Last data filed at 05/18/2021 1808 Gross per 24 hour  Intake 240 ml  Output --  Net 240 ml    Additional Objective Labs: Basic Metabolic Panel: Recent Labs  Lab 05/17/21 1444  NA 135  K 4.7  CL 98  CO2 29  GLUCOSE 244*  BUN 28*  CREATININE 5.52*  CALCIUM 9.4  PHOS 3.7   Liver Function Tests: Recent Labs  Lab 05/17/21 1444  ALBUMIN 3.1*    CBC: Recent Labs  Lab 05/14/21 0454 05/17/21 1444  WBC 12.1* 10.6*  HGB 9.3* 9.8*  HCT 27.7* 29.2*  MCV 87.4 88.5  PLT 290 297    CBG: Recent Labs  Lab 05/18/21 0638 05/18/21 1117 05/18/21 1724 05/18/21 2117 05/19/21 0640  GLUCAP 135* 192* 176* 149* 103*   Medications:   (feeding supplement) PROSource Plus  30 mL Oral BID BM   aspirin EC  81 mg Oral Daily   B-complex with vitamin C  1 tablet Oral Daily   carvedilol  3.125 mg Oral BID WC   Chlorhexidine Gluconate Cloth  6 each Topical BID   Chlorhexidine Gluconate Cloth  6 each Topical Q0600   cholecalciferol  2,000 Units Oral Daily   clopidogrel  75 mg Oral Daily   [START ON 05/21/2021] darbepoetin (ARANESP) injection - DIALYSIS  100 mcg  Intravenous Q Fri-HD   diclofenac Sodium  2 g Topical QID   heparin  5,000 Units Subcutaneous Q8H   insulin aspart  0-6 Units Subcutaneous TID WC   insulin glargine  13 Units Subcutaneous Daily   levETIRAcetam  500 mg Oral QHS   melatonin  3 mg Oral QHS   methylphenidate  5 mg Oral BID WC   mometasone-formoterol  2 puff Inhalation BID   pantoprazole  40 mg Oral Daily   polyethylene glycol  17 g Oral Daily   rosuvastatin  20 mg Oral Daily   senna-docusate  2 tablet Oral BID   sevelamer carbonate  800 mg Oral TID WC   traZODone  100 mg Oral QHS    Dialysis Orders: MWF @ [facility] 4 hrs 180NRe 400/Autoflow 1.5 86.5 kg 2.0K/2.0 Ca TDC, heparin 2000 units bolus - Mircera 100 mcg IV q 2 weeks (last dose 04/14/2021 - Hectorol 5 mcg IV TIW   Assessment/Plan: 1. Acute R subcortical CVA: Back in CIR as of 7/15, transiently back to [facility] 7/14-7/15 with recurrent stroke like symptoms, likely watershed v. hypotensive issue per neuro. 2. Acute LGIB: Hx hemorrhoidal bleeding in past - s/p 2U PRBCs 6/26.  S/p EGD/flex sig without clear bleeding source. Plavix and ASA held, now resumed. 3. ESRD: HD MWF. HD today per regular schedule.  No added heparin. 4. Dialysis Access: S/p 1st stage BVT 7/8 by Dr. Scot Dock - mild steal symptoms, monitoring for now. 5. HTN/volume: Variable BP - trying to avoid intradialytic hypotension, she is well below OP dry weight.  UF as tolerated.  6. Anemia: Hgb^9.8 - continue Aranesp 166mcg q Friday. 7. Secondary hyperparathyroidism: Hectoral on hold for low PTH x 2. Binder changed from Renvela pills to powder d/t issues swallowing pills. Ca/Phos ok for now. 8. Nutrition: Alb low, continue supplements. 9. T2DM: Per primary. 10. Insomnia: Per notes, has been awake at night and sleeping during day - primary trialing ritalin during day and melatonin at night. Watch BP with stimulants.  Jen Mow, PA-C [facility] 05/19/2021,11:15 AM  LOS: 12 days

## 2021-05-19 NOTE — Progress Notes (Signed)
Speech Language Pathology Daily Session Note  Patient Details  Name: Carolyn Russo MRN: [id number] Date of Birth: [birth year]  Today's Date: 05/19/2021 SLP Individual Time: 0915-1000 SLP Individual Time Calculation (min): 45 min  Short Term Goals: Week 2: SLP Short Term Goal 1 (Week 2): STG=LTG due to short ELOS (8/4)  Skilled Therapeutic Interventions: Skilled SLP intervention focused on cognition. Pt sleeping when slp arrived but able to stay awake after SLP set up regular food trials for patient. She appeared lethargic but remained alert to feed self with snack and to complete simple card sorting problem solving exercise. She demonstrated good recall of specific location for several items in her room. She requested to call husband at end of session and required assistance to press buttons. Pt left seated upright in WC with bed alarm set and call button within reach. Cont with therapy per plan of care.      Pain Pain Assessment Pain Scale: Faces Faces Pain Scale: No hurt  Therapy/Group: Individual Therapy  Darrol Poke Salisha Bardsley 05/19/2021, 9:59 AM

## 2021-05-20 ENCOUNTER — Ambulatory Visit: Payer: Self-pay

## 2021-05-20 ENCOUNTER — Telehealth: Payer: Medicare Other

## 2021-05-20 DIAGNOSIS — G629 Polyneuropathy, unspecified: Secondary | ICD-10-CM

## 2021-05-20 DIAGNOSIS — I5033 Acute on chronic diastolic (congestive) heart failure: Secondary | ICD-10-CM

## 2021-05-20 DIAGNOSIS — N186 End stage renal disease: Secondary | ICD-10-CM

## 2021-05-20 DIAGNOSIS — I6381 Other cerebral infarction due to occlusion or stenosis of small artery: Secondary | ICD-10-CM

## 2021-05-20 DIAGNOSIS — I639 Cerebral infarction, unspecified: Secondary | ICD-10-CM | POA: Diagnosis not present

## 2021-05-20 DIAGNOSIS — E1165 Type 2 diabetes mellitus with hyperglycemia: Secondary | ICD-10-CM

## 2021-05-20 LAB — RENAL FUNCTION PANEL
Albumin: 3.2 g/dL — ABNORMAL LOW (ref 3.5–5.0)
Anion gap: 8 (ref 5–15)
BUN: 42 mg/dL — ABNORMAL HIGH (ref 8–23)
CO2: 26 mmol/L (ref 22–32)
Calcium: 9.1 mg/dL (ref 8.9–10.3)
Chloride: 99 mmol/L (ref 98–111)
Creatinine, Ser: 5.96 mg/dL — ABNORMAL HIGH (ref 0.44–1.00)
GFR, Estimated: 7 mL/min — ABNORMAL LOW (ref >60)
Glucose, Bld: 212 mg/dL — ABNORMAL HIGH (ref 70–99)
Phosphorus: 4.4 mg/dL (ref 2.5–4.6)
Potassium: 4.6 mmol/L (ref 3.5–5.1)
Sodium: 133 mmol/L — ABNORMAL LOW (ref 135–145)

## 2021-05-20 LAB — CBC
HCT: 28.8 % — ABNORMAL LOW (ref 36.0–46.0)
Hemoglobin: 9.6 g/dL — ABNORMAL LOW (ref 12.0–15.0)
MCH: 29.6 pg (ref 26.0–34.0)
MCHC: 33.3 g/dL (ref 30.0–36.0)
MCV: 88.9 fL (ref 80.0–100.0)
Platelets: 324 10*3/uL (ref 150–400)
RBC: 3.24 MIL/uL — ABNORMAL LOW (ref 3.87–5.11)
RDW: 16.2 % — ABNORMAL HIGH (ref 11.5–15.5)
WBC: 10.4 10*3/uL (ref 4.0–10.5)
nRBC: 0 % (ref 0.0–0.2)

## 2021-05-20 LAB — GLUCOSE, CAPILLARY
Glucose-Capillary: 104 mg/dL — ABNORMAL HIGH (ref 70–99)
Glucose-Capillary: 145 mg/dL — ABNORMAL HIGH (ref 70–99)
Glucose-Capillary: 145 mg/dL — ABNORMAL HIGH (ref 70–99)

## 2021-05-20 NOTE — Progress Notes (Signed)
[facility] Progress Note   Subjective:   Patient seen and examined in room.  Sleepy today.  Wakes briefly to answer questions and returns to sleep.  Denies CP, SOB, n/v/d and abdominal pain.  Objective Vitals:   05/19/21 2051 05/20/21 0434 05/20/21 0803 05/20/21 0818  BP:  (!) 122/98 (!) 141/74   Pulse: 74 86 75   Resp: 18 20    Temp:  (!) 97.5 F (36.4 C)    TempSrc:  Oral    SpO2: 95% 98%  98%  Weight:  80.1 kg    Height:       Physical Exam General:well appearing, tired female in NAD Heart:RRR, no mrg Lungs:CTAB, nml WOB Abdomen:soft, NTND Extremities:no LE edema Dialysis Access: [facility], RU AVF maturing   Filed Weights   05/18/21 0541 05/19/21 1453 05/20/21 0434  Weight: 82.3 kg 83.6 kg 80.1 kg    Intake/Output Summary (Last 24 hours) at 05/20/2021 1213 Last data filed at 05/20/2021 0900 Gross per 24 hour  Intake 712 ml  Output --  Net 712 ml    Additional Objective Labs: Basic Metabolic Panel: Recent Labs  Lab 05/17/21 1444  NA 135  K 4.7  CL 98  CO2 29  GLUCOSE 244*  BUN 28*  CREATININE 5.52*  CALCIUM 9.4  PHOS 3.7   Liver Function Tests: Recent Labs  Lab 05/17/21 1444  ALBUMIN 3.1*    CBC: Recent Labs  Lab 05/14/21 0454 05/17/21 1444  WBC 12.1* 10.6*  HGB 9.3* 9.8*  HCT 27.7* 29.2*  MCV 87.4 88.5  PLT 290 297   CBG: Recent Labs  Lab 05/19/21 0640 05/19/21 1205 05/19/21 1626 05/19/21 2055 05/20/21 0558  GLUCAP 103* 202* 169* 119* 104*   Medications:   (feeding supplement) PROSource Plus  30 mL Oral BID BM   aspirin EC  81 mg Oral Daily   B-complex with vitamin C  1 tablet Oral Daily   carvedilol  3.125 mg Oral BID WC   Chlorhexidine Gluconate Cloth  6 each Topical BID   Chlorhexidine Gluconate Cloth  6 each Topical Q0600   cholecalciferol  2,000 Units Oral Daily   clopidogrel  75 mg Oral Daily   [START ON 05/21/2021] darbepoetin (ARANESP) injection - DIALYSIS  100 mcg Intravenous Q Fri-HD   diclofenac  Sodium  2 g Topical QID   heparin  5,000 Units Subcutaneous Q8H   insulin aspart  0-6 Units Subcutaneous TID WC   insulin glargine  13 Units Subcutaneous Daily   levETIRAcetam  500 mg Oral QHS   melatonin  3 mg Oral QHS   methylphenidate  5 mg Oral BID WC   mometasone-formoterol  2 puff Inhalation BID   pantoprazole  40 mg Oral Daily   polyethylene glycol  17 g Oral Daily   rosuvastatin  20 mg Oral Daily   senna-docusate  2 tablet Oral BID   sevelamer carbonate  800 mg Oral TID WC   traZODone  100 mg Oral QHS    Dialysis Orders: MWF @ [facility] 4 hrs 180NRe 400/Autoflow 1.5 86.5 kg 2.0K/2.0 Ca TDC, heparin 2000 units bolus - Mircera 100 mcg IV q 2 weeks (last dose 04/14/2021 - Hectorol 5 mcg IV TIW   Assessment/Plan: 1. Acute R subcortical CVA: Back in CIR as of 7/15, transiently back to [facility] 7/14-7/15 with recurrent stroke like symptoms, likely watershed v. hypotensive issue per neuro. 2. Acute LGIB: Hx hemorrhoidal bleeding in past - s/p 2U PRBCs 6/26. S/p EGD/flex sig without  clear bleeding source. Plavix and ASA held, now resumed. 3. ESRD: HD MWF. Off schedule today due to increased patient census.  Plan for HD again tomorrow to return to regular schedule.  No added heparin. 4. Dialysis Access: S/p 1st stage BVT 7/8 by Dr. Scot Dock - mild steal symptoms, monitoring for now. 5. HTN/volume: Variable BP - trying to avoid intradialytic hypotension, she is well below OP dry weight.  UF as tolerated.  6. Anemia: Hgb^9.8 - continue Aranesp 145mcg q Friday. 7. Secondary hyperparathyroidism: Hectoral on hold for low PTH x 2. Binder changed from Renvela pills to powder d/t issues swallowing pills. Ca/Phos ok for now. 8. Nutrition: Alb low, continue supplements. 9. T2DM: Per primary. 10. Insomnia: Per notes, has been awake at night and sleeping during day - primary trialing ritalin during day and melatonin at night. Watch BP with stimulants.  Jen Mow, PA-C [facility] 05/20/2021,12:13 PM  LOS: 13 days

## 2021-05-20 NOTE — Progress Notes (Signed)
Occupational Therapy Weekly Progress Note  Patient Details  Name: Carolyn [facility]so MRN: [id number] Date of Birth: 07-15-1951  Beginning of progress report period: May 14, 2021 End of progress report period: May 20, 2021  Today's Date: 05/20/2021   Patient has met 3 of 4 short term goals. Pt making gradual progress towards OT goals, d/t improved balance, strength, RUE gross motor coordination and [place]. Pt continues to present with impaired activity tolerance, generalized deconditioning and fatigue impacting pts ability to complete ADLs independently. Pts completes functional mobility with RW at overall CGA, pt currently requires MAX A for dressing, up to MOD A for toileting, MIN - MOD A for bathing from w/c level and set-up for self feeding with built up utensils. Pts husband has been present intermittently during sessions reporting that he plans to hire an aid to assist pt with functional mobility and ADLs while he is at work.   Patient continues to demonstrate the following deficits: muscle weakness, decreased cardiorespiratoy endurance, abnormal tone, decreased coordination, and decreased motor planning, decreased motor planning, decreased initiation, decreased awareness, decreased problem solving, decreased safety awareness, decreased memory, and delayed processing, and decreased sitting balance, decreased standing balance, decreased postural control, hemiplegia, and decreased balance strategies and therefore will continue to benefit from skilled OT intervention to enhance overall performance with BADL and Reduce care partner burden.  Patient progressing toward long term goals..  Continue plan of care.  OT Short Term Goals Week 3:  OT Short Term Goal 1 (Week 3): STG= LTG   Therapy Documentation Precautions:  Precautions Precautions: Fall, Other (comment) Precaution Comments: L inattention, L hemiparesis, seizure protocol, impaired R UE coordination, R port a cath Restrictions Weight  Bearing Restrictions: No   Therapy/Group: Individual Therapy  Corinne Ports [facility] 05/20/2021, 8:23 AM

## 2021-05-20 NOTE — Progress Notes (Signed)
Physical Therapy Session Note  Patient Details  Name: Carolyn Russo MRN: [id number] Date of Birth: December 13, 1950  Today's Date: 05/20/2021 PT Individual Time: 1105-1210 PT Individual Time Calculation (min): 65 min   Short Term Goals: Week 2:  PT Short Term Goal 1 (Week 2): =LTG due to ELOS  Skilled Therapeutic Interventions/Progress Updates: Pt presented in w/c agreeable to therapy. Pt denies pain at present states BLE were sore earlier but improved now that received rub. Pt did peseverate throughout session that RUE was "cold" redirected pt as able. Pt transported to rehab gym and performed ambulatory transfer to high/low mat. Pt participated in ambulation weaving through cones for gait training and safety with RW. Pt required mod multimodal cues for maintaining close proximity to RW as well as increasing awareness on L side to avoid cones when on L side. Pt required increased time to initiate activity and verbal cues for safe hand placement when performing sit to/from stand. Pt returned to w/c and transported to [place] entrance so pt could "warm up". Pt was able to ambulate ~172f x 2 in courtyard with CGA and mod/max verbal cues for safety with RW. Pt required increased time for all activities however with increased distance demonstrated improved safety with RW (however continued to require cues). Pt transported back to room and requesting to use bathroom. Pt performed ambulatory transfer to toilet with RW and CGA and required min/modA for LB clothing management. Pt initially requesting PTA to assist with clothes without pt making attempt to manage (+ urinary void). Pt ambulated to sink to perform hand hygiene requiring encouragement to use LUE and performing minimal reaching with RUE to obtain paper towels. With max encouragement pt returned to w/c with RW and set up with lunch tray. Pt left in w/c at end of session with belt alarm on, call bell within reach and current needs met.      Therapy  Documentation Precautions:  Precautions Precautions: Fall, Other (comment) Precaution Comments: L inattention, L hemiparesis, seizure protocol, impaired R UE coordination, R port a cath Restrictions Weight Bearing Restrictions: No General:   Vital Signs: Therapy Vitals Temp: 97.8 F (36.6 C) Temp Source: Oral Pulse Rate: 69 BP: (!) 143/63 Oxygen Therapy SpO2: 99 % O2 Device: Room Air Pain: Pain Assessment Pain Scale: 0-10 Pain Score: 0-No pain   Therapy/Group: Individual Therapy  Leighann Amadon Carman Essick, PTA  05/20/2021, 2:33 PM

## 2021-05-20 NOTE — Patient Instructions (Signed)
Goals Addressed      Cerebrovascular Accident - treatment optimized   On track    Timeframe:  Long-Range Goal Priority:  High Start Date:   05/20/21                          Expected End Date:  11/19/21   Next Scheduled follow up: 06/10/21        Self Care Activities:  Conitnue in patient rehabilitation following CVA as directed

## 2021-05-20 NOTE — Progress Notes (Signed)
PROGRESS NOTE     Subjective/Complaints:  Pt denies pain in R hand/wrist, however is having cramping in B/L LE's- says it's Moderate pain".  Didn't get HD yesterday due to staffing issues.   LBM yesterday,  Will have nursing try voltaren cream on legs first- and if not effective, will add Skelaxin.     ROS:  Pt denies SOB, abd pain, CP, N/V/C/D, and vision changes   Objective:       Physical Exam: Vital Signs    Vitals with BMI 05/20/2021 05/20/2021 05/19/2021  Height - - -  Weight - 176 lbs 9 oz -  BMI - 16.10 -  Systolic 960 454 -  Diastolic 74 98 -  Pulse 75 86 74                     General: awake, alert, appropriate, sitting up eating breakfast, nurse in room; NAD HENT: conjugate gaze; oropharynx moist CV: regular rate; no JVD Pulmonary: CTA B/L; no W/R/R- good air movement GI: soft, NT, ND, (+)BS; hypoactive Psychiatric: appropriate; stable Neurological: alert; still stable delayed responses, but appropriate  Musculoskeletal:      no Pain/TTP with ROM of R wrist this AM or R hand/fingers- R hand very slightly cooler than L hand; trace swelling on dorsum of R wrist which is new. No erythema.  R wrist/hand looks stable today, however also rechecked strength- R grip 2+/5 in R hand and Finger abd 2/5 at best Skin:    General: Skin is warm and dry. Neurological:    Comments: alert, follows commands.   Fair insight and awareness. Provided biographical information. Normal language, speech slightly slurred. Mild left central 7. LUE 2 to 2+/5 deltoid, 3/5 biceps, triceps and 3+/5 wrist and HI. LLE 3-4/5 prox to distal. RUE 4/5 and RLE 5/5 prox to distal.  Tone is normal in the right upper limb no sensory deficits noted--motor/sensory exam stable today.  also cannot do finger touching to each finger/thumb- not able to do any dexterity movements, or hold pen.   NO change on dexterity this  AM   Assessment/Plan: 1. Functional deficits which require 3+ hours per day of interdisciplinary therapy in a comprehensive inpatient rehab setting. Physiatrist is providing close team supervision and 24 hour management of active medical problems listed below. Physiatrist and rehab team continue to assess barriers to discharge/monitor patient progress toward functional and medical goals   Care Tool:   Bathing   Body parts bathed by patient: Right arm, Left arm, Chest, Abdomen, Front perineal area, Right upper leg, Left upper leg    Body parts bathed by helper: Right lower leg, Left lower leg, Buttocks    Bathing assist Assist Level: Minimal Assistance - Patient > 75%    Upper Body Dressing/Undressing Upper body dressing What is the patient wearing?: Pull over shirt   Upper body assist Assist Level: Minimal Assistance - Patient > 75%   Lower Body Dressing/Undressing Lower body dressing       What is the patient wearing?: Pants, Incontinence brief        Lower body assist Assist for lower body dressing: Minimal Assistance - Patient > 75%  Toileting Toileting   Toileting assist Assist for toileting: Moderate Assistance - Patient 50 - 74%    Transfers Chair/bed transfer   Transfers assist     Chair/bed transfer assist level: Minimal Assistance - Patient > 75%    Locomotion Ambulation     Ambulation assist       Assist level: Minimal Assistance - Patient > 75% Assistive device: Walker-rolling Max distance: 50'    Walk 10 feet activity     Assist     Assist level: Minimal Assistance - Patient > 75% Assistive device: Walker-rolling    Walk 50 feet activity     Assist Walk 50 feet with 2 turns activity did not occur: Safety/medical concerns   Assist level: Minimal Assistance - Patient > 75% Assistive device: Walker-rolling      Walk 150 feet activity     Assist Walk 150 feet activity did not occur: Safety/medical concerns        Walk 10 feet on  uneven surface activity     Assist Walk 10 feet on uneven surfaces activity did not occur: Safety/medical concerns         Wheelchair         Assist Will patient use wheelchair at discharge?:  (TBD) Type of Wheelchair: Manual   Wheelchair assist level: Dependent - Patient 0% Max wheelchair distance: 150'      Wheelchair 50 feet with 2 turns activity       Assist           Assist Level: Dependent - Patient 0%    Wheelchair 150 feet activity       Assist       Assist Level: Dependent - Patient 0%     Medical Problem List and Plan: 1.  Left side weakness secondary to nonhemorrhagic right basal ganglia infarction             -patient may shower Reviewed noted 2 day interrupted stay 7/13--7/15 for altered MS , no new CVA identified             con't OT, OT and SLP- will add Ritalin for lethargy and initiation- however due to BP being on high side, will do 2.5 mg with breakfast and lunch.  Continue CIR- PT, OT and SLP 2.  Impaired mobility: -DVT/anticoagulation:  continue SCD             -antiplatelet therapy:ASA             Plavix restarted. 3. Right hip bursitis: continue Tramadol 50 mg every 12 hours as needed, ice, ROM, lidocaine patch  7/27- R wrist pain better- con't regimen  7/28- having leg cramping- if voltaren doesn't help (gel) will try Skelaxin since less sedating muscle relaxant. Nursing ot let me know.  4. Anxiety/stroke poor initiation: continue Xanax 0.5 mg 3 times daily as needed.  Provide emotional support             7/12- per husband, concerned about flat affect- will d/w possible depression with pt- if so, will start SSRI.             7/20- BP looks OK- will increase to 5 mg BID  7/22- more awake, alert this AM than usual- more initiation- con't regimen- BP controlled for her. 7/26- more awake today (better than yesterday) slept better- con't regimen               -antipsychotic agents: N/A 5. Neuropsych: This patient is ?capable of making  decisions on her own behalf. 6. Skin/Wound Care: Routine skin checks 7. Fluids/Electrolytes/Nutrition: Routine in and outs with follow-up chemistries 8.  Acute lower GI bleed with blood loss anemia.  Follow-up GI services.  Resume ASA as above. - Continue Protonix 40 mg twice daily 7/6: hgb 9.6--monitor for gross bleeding 7/11- last Hb stable at 9.8- will recheck this week             7/12- Hb came back at 9.7- stable- con't regimen             7/13- Hb up to 11.1- will con't to monitor  7/17- Hb 10.6- con't regimen  7/22- Hb down slightly to 9.3- con't Aranesp.  On Aranesp q Friday for anemia or chronic disease due to ESRD  7/27- will see if can get labs in HD? Since she's a very hard stick otherwise.  9.  Chronic diastolic congestive heart failure.  Monitor for any signs of fluid overload.  Continue Lasix 40-80 mg Monday Wednesday Friday             -daily weights, have been stable             7/12- weight bounced up 1 kg today, however was this level 2-3 days ago, so think yesterday's weight might have been slightly off- will monitor  7/22- weight was up yesterday to 86 kg, but down to 80.8 kg today- back to baseline- con't regimen 10.  Newly Dx'd End-stage renal disease.  Hemodialysis as per renal services             7/12- questionable steal syndrome since Fistula placement- will monitor per Vascular- to see if  needs additional intervention  7/19- R hand still cooler to touch- also has decreased dexterity- will likely need EMG after d/c.   7/22- had HD yesterday, not Wednesday when was due- per Renal, will get back on track.   7/28- didn't get HD yesterday- to get today.  11.  Hypertension.  Increase Coreg to 12.5 mg twice daily. Aggressive filtration with ultradialysis tomorrow             7/11- HD today- BP 170s/90s this AM- per renal.             7/12- BP dropped to 70s/50s with HD yesterday- back up to 150s/80s today- con't reigmen  7/17- BP 150s/160s- per renal- will only start  2.5 mg of Ritalin due to BP issues- will monitor closely.  Vitals:   05/20/21 0803 05/20/21 0818  BP: (!) 141/74   Pulse: 75   Resp:    Temp:    SpO2:  98%    4/09- BP 811B systolic- con't regimen 12.  Asthma.  Continue inhalers as directed             - was getting Home O2 prior to admission- will likely need to go home on 2L O2-   Was trying to get at home, but hadn't yet.             7/1- con't nebs prn, home O2 ?PRN only             7/3 lungs very clear today, sats 99% on RA. Some of SOB may be behaviorally related. Reassured pt today that lungs sounded fine.                         -encouraged FV/IS, OOB  7/7: ordered CXT-stable, ordered incentive spirometer 13.Hyperlipidemia.Crestor 14.Obesity.BMI 34.31.Dietery follow up 15.  Diabetes mellitus.hemoglobin AIc 5.2.  CBGs 211-310. Increase Lantus to 13U BID.  7/17- BG's don't look as good- >200- will follow trend before making change yet.   7/20- increased Lantus to 8 units daily- will monitor- increased from 5 units.   7/22- Bgs 139 to 251 in last 24 hours- increase Lantus to 10 units daily  7/25- BG's 163-273- will increase lantus from 10 units to 13 units- was on 13 units BID_ for some reason it was reduced??? Will see if needs BID if this dose doesn't work well.   7/26- BG's -hasn't had a chance to get better- just got AM dose today- first dose of increased dose. Will monitor  7/27- BG's 103-202- much improved- had been on Lantus 13 units BID when got here- so I'm not sure what changed, when I was on vacation?not clear- but don't feel comfortable increasing since lows low 100s-   7/28- BG's look great in last 24 hours- con't regimen CBG (last 3)  Recent Labs    05/19/21 1626 05/19/21 2055 05/20/21 0558  GLUCAP 169* 119* 104*    16. Leukocytosis: trending down, repeat Monday             7/11- last WBC was 13k- will recheck in HD today.              7/13- WBC down to 10.1- will monitor  7/16- Last WBC 12.1- was  12.8 last week- bounced down and went back up again?- con't to monitor, labs in HD  7/22- WBC stable at 12.1- don't see a cause?   7/25- WBC reduced to 10.6- con't to monitor 17. Constipation: KUB shows moderate stool burden. Increase senna-docusate to BID.              7/13- LBM yesterday- con't regimen  7/19- LBM 2-3 days ago- will give a dose of Sorbitol at lunch time. 7/20- will give another dose of Sorbitol since cannot give Mg- early this AM- so might go before HD- KUB shows moderate volume of stool throughout colon- could be cause of LUQ pain   7/28- LBM yesterday- con't regimen  18. Fistula placement: successful, new fistula accessed without complications.  7/20- possible steal syndrome of RUE- cooler hand on R- might need EMG after d/c.  19. Left shoulder pain: ordered lidocaine patch and voltaren gel. 20. Lethargy             7/12- is much improved today- will con't to monitor- CT and CXR of chest were stable             7/16- will readd Melatonin for sleep- never had the chance to use it.   7/17- didn't sleep at all- hopefully Ritalin due day 2.5 mg BID- will help- cannot use Amantadine secondary to HD.   7/22- tolerating Ritalin 5 mg BID= doing better   21. Insomnia/mixing of sleep wake cycle  7/20- will try Trazodone 50 mg QHS and see if can get back on good sleep cycle? Encouraged pt/staff to try and keep her awake more during day.   7/21- didn't sleep more than 1 hour overnight- will increase trazodone to 75 mg QHS. More awake this AM  7/22- slept a few hours last night- slightly better- will increase Trazodone to 100 mg at 8pm nightly to reduce sleepiness in AM.  22.  Right upper extremity discomfort, difficult to get a good history.  Today she is  complaining more of wrist pain when using the walker.  Her exam is unremarkable no history of trauma.  We will trial some Voltaren gel to the right wrist 4 times per day  7/25- per renal note, still c/o R hand/wrist pain- has been  on/off since fistula placed.   7/27- denies pain- con't regimen

## 2021-05-20 NOTE — Chronic Care Management (AMB) (Signed)
Chronic Care Management   CCM RN Visit Note  05/20/2021 Name: Carolyn Russo MRN: [id number] DOB: 1951/10/11  Subjective: Carolyn Russo is a 70 y.o. year old female who is a primary care patient of [name], MD. The care management team was consulted for assistance with disease management and care coordination needs.    Chart Review  for follow up visit in response to provider referral for case management and/or care coordination services.   Consent to Services:  The patient was given information about Chronic Care Management services, agreed to services, and gave verbal consent prior to initiation of services.  Please see initial visit note for detailed documentation.   Patient agreed to services and verbal consent obtained.   Assessment: Review of patient past medical history, allergies, medications, health status, including review of consultants reports, laboratory and other test data, was performed as part of comprehensive evaluation and provision of chronic care management services.   SDOH (Social Determinants of Health) assessments and interventions performed:    CCM Care Plan  Allergies  Allergen Reactions   Food Anaphylaxis    Peanuts; Almonds   Other Shortness Of Breath    Peanuts; Almonds   Wheat Bran Shortness Of Breath   Statins Itching and Other (See Comments)    Generalized aches   Iodine Other (See Comments)    The patient denied this allergy to me on 03/16/2021   Shellfish Allergy Other (See Comments)    Mouth gets raw   Sitagliptin     Other reaction(s): Unknown   Tetracycline Other (See Comments)    Raw mouth   Tetracyclines & Related Itching   Contrast Media [Iodinated Diagnostic Agents] Rash    Happened during CT scan over 30 years ago    Facility-Administered Encounter Medications as of 05/20/2021  Medication   (feeding supplement) PROSource Plus liquid 30 mL   acetaminophen (TYLENOL) tablet 650 mg   Or   acetaminophen (TYLENOL) 160 MG/5ML  solution 650 mg   Or   acetaminophen (TYLENOL) suppository 650 mg   albuterol (PROVENTIL) (2.5 MG/3ML) 0.083% nebulizer solution 2.5 mg   aspirin EC tablet 81 mg   B-complex with vitamin C tablet 1 tablet   carvedilol (COREG) tablet 3.125 mg   Chlorhexidine Gluconate Cloth 2 % PADS 6 each   Chlorhexidine Gluconate Cloth 2 % PADS 6 each   cholecalciferol (VITAMIN D3) tablet 2,000 Units   clopidogrel (PLAVIX) tablet 75 mg   [START ON 05/21/2021] Darbepoetin Alfa (ARANESP) injection 100 mcg   diclofenac Sodium (VOLTAREN) 1 % topical gel 2 g   heparin injection 1,000 Units   heparin injection 5,000 Units   hydrALAZINE (APRESOLINE) tablet 10 mg   hydrocortisone (ANUSOL-HC) 2.5 % rectal cream 1 application   insulin aspart (novoLOG) injection 0-6 Units   insulin glargine (LANTUS) injection 13 Units   levETIRAcetam (KEPPRA) tablet 500 mg   melatonin tablet 3 mg   methylphenidate (RITALIN) tablet 5 mg   mometasone-formoterol (DULERA) 200-5 MCG/ACT inhaler 2 puff   ondansetron (ZOFRAN) tablet 4 mg   pantoprazole (PROTONIX) EC tablet 40 mg   polyethylene glycol (MIRALAX / GLYCOLAX) packet 17 g   rosuvastatin (CRESTOR) tablet 20 mg   senna-docusate (Senokot-S) tablet 2 tablet   sevelamer carbonate (RENVELA) tablet 800 mg   traZODone (DESYREL) tablet 100 mg   Outpatient Encounter Medications as of 05/20/2021  Medication Sig   acetaminophen (TYLENOL) 500 MG tablet Take 1 tablet (500 mg total) by mouth every 6 (six) hours  as needed for mild pain, fever or headache.   albuterol (PROVENTIL) (2.5 MG/3ML) 0.083% nebulizer solution Take 3 mLs (2.5 mg total) by nebulization every 4 (four) hours as needed for wheezing or shortness of breath.   albuterol (VENTOLIN HFA) 108 (90 Base) MCG/ACT inhaler Inhale 2 puffs into the lungs every 4 (four) hours as needed for wheezing or shortness of breath.   b complex vitamins capsule Take 1 capsule by mouth daily.   BESIVANCE 0.6 % SUSP Place 1 drop into both eyes  See admin instructions. After  eye injections   Caraway Oil-Levomenthol (FDGARD PO) Take 1 tablet by mouth 2 (two) times daily as needed (nausea).   carvedilol (COREG) 3.125 MG tablet Take 1 tablet (3.125 mg total) by mouth 2 (two) times daily with a meal.   Cholecalciferol (VITAMIN D3 PO) Take 2,000 Units by mouth daily.   clopidogrel (PLAVIX) 75 MG tablet Take 1 tablet (75 mg total) by mouth daily.   Continuous Blood Gluc Receiver (DEXCOM G6 RECEIVER) DEVI Use as directed to check blood sugars 3 times per day dx: e11.65   Continuous Blood Gluc Sensor (DEXCOM G6 SENSOR) MISC Use as directed to check blood sugars 3 times per day dx: e11.65   Continuous Blood Gluc Transmit (DEXCOM G6 TRANSMITTER) MISC Use as directed to check blood sugars 3 times per day dx: e11.65   fluticasone (FLONASE) 50 MCG/ACT nasal spray Place 2 sprays into the nose daily as needed for allergies.    hydrocortisone (ANUSOL-HC) 2.5 % rectal cream Place 1 application rectally 2 (two) times daily as needed for hemorrhoids or anal itching.   levETIRAcetam (KEPPRA) 500 MG tablet Take 1 tablet (500 mg total) by mouth at bedtime.   ondansetron (ZOFRAN) 4 MG tablet Take 1 tablet (4 mg total) by mouth every 6 (six) hours as needed for nausea or vomiting.   OneTouch Delica Lancets 94R MISC Use as directed to check blood sugars before breakfast and dinner dx: e11.65   pantoprazole (PROTONIX) 40 MG tablet Take 1 tablet (40 mg total) by mouth daily.   rosuvastatin (CRESTOR) 20 MG tablet Take 1 tablet (20 mg total) by mouth daily.   senna-docusate (SENOKOT-S) 8.6-50 MG tablet Take 1 tablet by mouth at bedtime as needed for mild constipation.   sevelamer carbonate (RENVELA) 800 MG tablet Take 1 tablet (800 mg total) by mouth 3 (three) times daily with meals.   SYMBICORT 160-4.5 MCG/ACT inhaler Inhale 2 puffs into the lungs 2 (two) times daily.   vitamin C (ASCORBIC ACID) 500 MG tablet Take 500 mg by mouth daily.   [DISCONTINUED] iron  polysaccharides (NIFEREX) 150 MG capsule Take 1 capsule (150 mg total) by mouth daily. (Patient not taking: No sig reported)   [DISCONTINUED] mometasone-formoterol (DULERA) 200-5 MCG/ACT AERO Inhale 2 puffs into the lungs 2 (two) times daily.    Patient Active Problem List   Diagnosis Date Noted   Stroke-like symptoms 05/06/2021   Bandemia    Cerebrovascular accident (CVA) of right basal ganglia ([place]) 04/20/2021   External hemorrhoids    Basal ganglia infarction ([place]) 04/16/2021   ESRD on dialysis ([place]) 04/16/2021   Hypertensive urgency 03/10/2021   Hilar enlargement    Anemia of chronic disease    Chronic diastolic CHF (congestive heart failure) ([place]) 03/09/2021   CKD (chronic kidney disease), stage V ([place]) 03/08/2021   Diabetic retinopathy ([place]) 03/08/2021   Hyperglycemia due to type 2 diabetes mellitus ([place]) 03/08/2021   Pure hypercholesterolemia 03/08/2021   Anemia in  chronic kidney disease 09/24/2020   Uncontrolled type 2 diabetes mellitus with hyperglycemia ([place]) 03/19/2019   Osteopenia 09/04/2018   Migraine 09/04/2018   Asthma 09/04/2018   Pseudophakia of both eyes 07/17/2018   Pseudophakia of left eye 07/03/2018   Open angle with borderline findings and high glaucoma risk in left eye 07/03/2018   Age-related nuclear cataract of right eye 07/03/2018   CAD (coronary artery disease), native coronary artery 04/27/2017   Abnormal nuclear stress test 04/04/2017   Dyspnea on exertion 03/09/2017   Appendicitis    Age-related hypermature cataract of both eyes 12/20/2016   Uterine leiomyoma 11/27/2012   Dyslipidemia (high LDL; low HDL) 11/25/2011   Obesity (BMI 30-39.9) 11/25/2011   Hypertensive disorder 11/21/2011   Type 2 diabetes mellitus ([place]) 11/21/2011   Abnormal EKG 11/21/2011    Conditions to be addressed/monitored:CHF, DMII, ESRD, and Cerebrovascular Accident of right basal ganglia  Care Plan : Cerebrovascular Accident  Updates made by Lynne Logan, RN since  05/20/2021 12:00 AM     Problem: Cerebrovascular Accident   Priority: High     Long-Range Goal: Cerebrovascular Accident treatment optimized   Start Date: 05/20/2021  Expected End Date: 11/19/2021  This Visit's Progress: On track  Priority: High  Note:   Current Barriers:  Ineffective Self Health Maintenance in a patient with  Cerebral Vascular Accident  Current Inpatient Rehabilitation for CVA Clinical Goal(s):  Collaboration with [name], MD regarding development and update of comprehensive plan of care as evidenced by provider attestation and co-signature Inter-disciplinary care team collaboration (see longitudinal plan of care) patient will work with care management team to address care coordination and chronic disease management needs related to Disease Management Educational Needs Care Coordination Medication Management and Education Psychosocial Support Caregiver Stress support   Interventions:  05/20/21 Chart Review  Evaluation of current treatment plan related to  CVA , self-management and patient's adherence to plan as established by provider. Collaboration with [name], MD regarding development and update of comprehensive plan of care as evidenced by provider attestation       and co-signature Inter-disciplinary care team collaboration (see longitudinal plan of care) Determined patient is currently inpatient at [facility] with admit date of 05/07/21 for cerebrovascular accident  Self Care Activities:  Conitnue in patient rehabilitation following CVA as directed   Follow Up Plan: Telephone follow up appointment with care management team member scheduled for: 06/10/21    Plan:Telephone follow up appointment with care management team member scheduled for:  06/10/21  Barb Merino, RN, BSN, CCM Care Management Coordinator [place] [facility]  Direct Phone: [phone]

## 2021-05-20 NOTE — Consult Note (Addendum)
NEURO HOSPITALIST CONSULT NOTE   Requestig physician: Dr. Dagoberto Ligas  Reason for Consult: Subacute right hand numbness  HPI:                                                                                                                                          Carolyn Russo is a 70 y.o. female with ESRD on HD, type 2 diabetes, HFpEF, and recent admission for a right basal ganglia infarct.   She recently became RRT dependent and underwent a right basilic vein fistula placement by vascular on 04/30/21. Since that time, she has been experiencing numbness and weakness in the right hand. Numbness involves the entire right hand and is described as a sensation of having a glove on. Symptoms occur most commonly in the morning, are intermittent and do not correlate with HD sessions. She also notes a sensation of her hand being cool. She has associated pain on the dorsal aspect of the right wrist and extending up a few inches medially. She denies prior injury to the right wrist. She reports a prior rotator cuff injury of the right shoulder.   She was re-evaluated by vascular surgery on 05/03/21 for the above symptoms, at which time she was noted to have a warm hand with palpable radial pulse. Hand grip was noted to be fairly equal at that time. Vascular surgery discussed the possiblility of steal syndrome and offered ligation of the fistula, which she declined.   Of note, she had a CTH, CTA head/neck, and brain MRI performed 7/13-7/14 after having altered mentation, dysarthria, worsened left sided weakness, left hemineglect and right gaze deviation. MRI at that time was negative for an acute infarct and showed evolving prior right corona radiata and basal ganglia infarct.   Past Medical History:  Diagnosis Date   Acute GI bleeding    Allergy    Anemia    Anterior chest wall pain    Appendicitis 1965   Asthma    Body mass index 37.0-37.9, adult    Breast pain    Cataract    both eyes    CHF (congestive heart failure) ([place])    Chronic kidney disease    stage 5 - on dialysis   Cognitive change 04/20/2021   r/t cva 03/2821   Dehydration 2014   Deviated septum 1971   Diabetes mellitus    no meds   Dyspnea 2014   Extrinsic asthma    WITH ASTHMA ATTACK   Fibroid 1980   GERD (gastroesophageal reflux disease)    Heart murmur    Hx gestational diabetes    Hyperlipidemia    Hypertension 2014   Inguinal hernia 1959   Malaise and fatigue 2014   Non-IgE mediated allergic asthma 2014   Obesity    Pelvic pain    Pregnancy, high-risk 1985   Stroke ([place]) 04/20/2021   (CVA) of right basal ganglia  Tonsillitis 1968   Uterine fibroid 1980    Past Surgical History:  Procedure Laterality Date   APPENDECTOMY  7322   [place] Right 04/30/2021   Procedure: RIGHT FIRST STAGE [place];  Surgeon: Angelia Mould, MD;  Location: [facility] OR;  Service: Vascular;  Laterality: Right;   CESAREAN SECTION  1985   COLONOSCOPY     ESOPHAGOGASTRODUODENOSCOPY (EGD) WITH PROPOFOL N/A 04/18/2021   Procedure: ESOPHAGOGASTRODUODENOSCOPY (EGD) WITH PROPOFOL;  Surgeon: Gatha Mayer, MD;  Location: [facility];  Service: Endoscopy;  Laterality: N/A;   EYE SURGERY     bilateral cataract    FLEXIBLE SIGMOIDOSCOPY N/A 04/18/2021   Procedure: FLEXIBLE SIGMOIDOSCOPY;  Surgeon: Gatha Mayer, MD;  [facility]COPY;  Service: Endoscopy;  Laterality: N/A;   HERNIA REPAIR  1959   IR FLUORO GUIDE CV LINE RIGHT  03/18/2021   IR US GUIDE VASC ACCESS RIGHT  03/18/2021   LEFT HEART CATH AND CORONARY ANGIOGRAPHY N/A 04/04/2017   Procedure: Left Heart Cath and Coronary Angiography;  Surgeon: Belva Crome, MD;  Location: [facility] CV LAB;  Service: Cardiovascular;  Laterality: N/A;   MYOMECTOMY  1980, 2004, 2007   RHINOPLASTY  1971   ROTATOR CUFF REPAIR  2003   SURGICAL REPAIR OF HEMORRHAGE  2015   TONSILLECTOMY  1968    Family History  Problem Relation Age  of Onset   Cancer Mother        abdominal melamona   Psoriasis Mother    Alzheimer's disease Father    Cancer Cousin        colon    Diabetes Cousin    Diabetes Maternal Aunt    Colon cancer Neg Hx    Colon polyps Neg Hx    Esophageal cancer Neg Hx    Rectal cancer Neg Hx    Stomach cancer Neg Hx    Breast cancer Neg Hx        Social History:  reports that she has never smoked. She has never used smokeless tobacco. She reports that she does not drink alcohol and does not use drugs.  Allergies  Allergen Reactions   Food Anaphylaxis    Peanuts; Almonds   Other Shortness Of Breath    Peanuts; Almonds   Wheat Bran Shortness Of Breath   Statins Itching and Other (See Comments)    Generalized aches   Iodine Other (See Comments)    The patient denied this allergy to me on 03/16/2021   Shellfish Allergy Other (See Comments)    Mouth gets raw   Sitagliptin     Other reaction(s): Unknown   Tetracycline Other (See Comments)    Raw mouth   Tetracyclines & Related Itching   Contrast Media [Iodinated Diagnostic Agents] Rash    Happened during CT scan over 30 years ago    MEDICATIONS:  No current facility-administered medications on file prior to encounter.   Current Outpatient Medications on File Prior to Encounter  Medication Sig Dispense Refill   acetaminophen (TYLENOL) 500 MG tablet Take 1 tablet (500 mg total) by mouth every 6 (six) hours as needed for mild pain, fever or headache. 60 tablet    albuterol (PROVENTIL) (2.5 MG/3ML) 0.083% nebulizer solution Take 3 mLs (2.5 mg total) by nebulization every 4 (four) hours as needed for wheezing or shortness of breath. 75 mL 2   albuterol (VENTOLIN HFA) 108 (90 Base) MCG/ACT inhaler Inhale 2 puffs into the lungs every 4 (four) hours as needed for wheezing or shortness of breath. 18 g 3   b complex vitamins capsule Take 1  capsule by mouth daily.     BESIVANCE 0.6 % SUSP Place 1 drop into both eyes See admin instructions. After  eye injections     Caraway Oil-Levomenthol (FDGARD PO) Take 1 tablet by mouth 2 (two) times daily as needed (nausea).     carvedilol (COREG) 3.125 MG tablet Take 1 tablet (3.125 mg total) by mouth 2 (two) times daily with a meal. 60 tablet 0   Cholecalciferol (VITAMIN D3 PO) Take 2,000 Units by mouth daily.     clopidogrel (PLAVIX) 75 MG tablet Take 1 tablet (75 mg total) by mouth daily.     Continuous Blood Gluc Receiver (DEXCOM G6 RECEIVER) DEVI Use as directed to check blood sugars 3 times per day dx: e11.65 1 each 1   Continuous Blood Gluc Sensor (DEXCOM G6 SENSOR) MISC Use as directed to check blood sugars 3 times per day dx: e11.65 3 each 2   Continuous Blood Gluc Transmit (DEXCOM G6 TRANSMITTER) MISC Use as directed to check blood sugars 3 times per day dx: e11.65 1 each 1   fluticasone (FLONASE) 50 MCG/ACT nasal spray Place 2 sprays into the nose daily as needed for allergies.      hydrocortisone (ANUSOL-HC) 2.5 % rectal cream Place 1 application rectally 2 (two) times daily as needed for hemorrhoids or anal itching. 30 g 1   levETIRAcetam (KEPPRA) 500 MG tablet Take 1 tablet (500 mg total) by mouth at bedtime.     ondansetron (ZOFRAN) 4 MG tablet Take 1 tablet (4 mg total) by mouth every 6 (six) hours as needed for nausea or vomiting. 90 tablet 1   OneTouch Delica Lancets 81W MISC Use as directed to check blood sugars before breakfast and dinner dx: e11.65 100 each 11   pantoprazole (PROTONIX) 40 MG tablet Take 1 tablet (40 mg total) by mouth daily.     rosuvastatin (CRESTOR) 20 MG tablet Take 1 tablet (20 mg total) by mouth daily. 90 tablet 3   senna-docusate (SENOKOT-S) 8.6-50 MG tablet Take 1 tablet by mouth at bedtime as needed for mild constipation.     sevelamer carbonate (RENVELA) 800 MG tablet Take 1 tablet (800 mg total) by mouth 3 (three) times daily with meals. 90 tablet 0    SYMBICORT 160-4.5 MCG/ACT inhaler Inhale 2 puffs into the lungs 2 (two) times daily. 10.2 g 5   vitamin C (ASCORBIC ACID) 500 MG tablet Take 500 mg by mouth daily.     [DISCONTINUED] iron polysaccharides (NIFEREX) 150 MG capsule Take 1 capsule (150 mg total) by mouth daily. (Patient not taking: No sig reported) 30 capsule 12   [DISCONTINUED] mometasone-formoterol (DULERA) 200-5 MCG/ACT AERO Inhale 2 puffs into the lungs 2 (two) times daily. 1 each 1    ROS:  Negative unless stated in HPI  Blood pressure (!) 131/55, pulse (!) 55, temperature 97.8 F (36.6 C), temperature source Oral, resp. rate 20, height 5\' 2"  (1.575 m), weight 78.7 kg, SpO2 99 %.   General Examination:                                                                                                       Physical Exam  General: chronically ill appearing female laying in bed Cardiac: RRR on telemetry. Extremities are equally warm to touch. Right radial pulse +2. Capillary refill <3 seconds. Pulm: breathing comfortably on room air GI: abdomen soft, non-distended Skin: no rash or lesion on limited exam Vascular: Palpable radial pulse on the right.   Neurological Examination Mental Status: Alert and oriented. Speech is fluent but slightly dysarthric. Comprehension intact.  Cranial Nerves: II: PERRL III,IV, VI: EOM intact V,VII: left  facial droop, sensation normal bilaterally VIII: hearing normal bilaterally IX,X: uvula rises symmetrically XI: bilateral shoulder shrug XII: midline tongue extension Motor: Mild weakness in proximal BUE (right 4+/5, left 4/5). Mild 4/5 left hand grip weakness. Distal right upper extremity weakness is significant, rated at 2-3/5 in the hand. Right hand weakness is in all 3 motor distributions: Radial nerve, ulnar nerve and median nerve.  Dorsiflexion and  plantar flexion of feet equal in the bilateral lower extremities.  Decreased fine motor movement with the right hand.  Sensory:  Normal sensation in the bilateral lower extremities and left upper extremity.  Right upper extremity has diminished sensation from the mid upper arm and extending distally into the fingers, with a proxima-distal gradient, worse distally. Sensory deficit of the arm and hand is both both ventral and dorsal and improves in a gradient like pattern proximally.  Cerebellar: No ataxia noted.  Gait: Deferred   Lab Results: Basic Metabolic Panel: Recent Labs  Lab 05/17/21 1444 05/20/21 1130  NA 135 133*  K 4.7 4.6  CL 98 99  CO2 29 26  GLUCOSE 244* 212*  BUN 28* 42*  CREATININE 5.52* 5.96*  CALCIUM 9.4 9.1  PHOS 3.7 4.4    CBC: Recent Labs  Lab 05/14/21 0454 05/17/21 1444 05/20/21 1130  WBC 12.1* 10.6* 10.4  HGB 9.3* 9.8* 9.6*  HCT 27.7* 29.2* 28.8*  MCV 87.4 88.5 88.9  PLT 290 297 324    Imaging: 05/05/21 CT head:  No acute intracranial pathology.  Age-related atrophy and chronic microvascular ischemic changes. Similar appearance of focal area of old infarct lateral to the right thalamus.  05/05/21 CTA head and neck: Negative CTA for emergent large vessel occlusion. Mild atheromatous change about the major arterial vasculature of the head and neck as above. No proximal high-grade or correctable stenosis. 2 mm focal outpouching arising from the proximal cavernous right ICA, which could reflect a small aneurysm versus vascular infundibulum. Attention at follow-up recommended.  05/06/21 MRI brain:  No acute infarct. Evolving prior infarction of right corona radiata and basal ganglia. Chronic microvascular disease ischemic changes.    Assessment: Taleigha KITTI MCCLISH is a 70 year old female with ESRD on  HD who recently underwent a right basilic vein fistula placement by vascular surgery on 04/30/21 and subsequently developed right hand numbness and  weakness.  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 Began soon after surgery.  -Re-evaluated by vascular surgery 7/11 for this complaint and they had recommended fistula ligation however patient had declined.  -Vascular was at bedside today and still recommends ligation of the fistula to avoid worsening symptoms however the patient states that she will need to discuss it with family - Low suspicion for steal syndrome given adequate radial pulse and warm hand.  - Low suspicion for acute stroke. CTH, CTA head and neck, and brain MRI that was obtained on 7/13 were negative for an acute infarct.   Recommendations: -No new recommendations at this time. Vascular surgery will follow up with patient and family for decision making regarding possible ligation the fistula.   Mitzi Hansen, MD Internal Medicine Resident PGY-3 [facility] Pager: [phone] 05/20/2021 4:12 PM     I have seen and examined the patient. I have reviewed and agree with the assessment, impression and recommendations. 70 year old female with ESRD on HD who recently underwent a right basilic vein fistula placement by vascular surgery on 04/30/21 and subsequently developed right hand numbness and weakness. Exam findings as above. Overall clinical presentation most consistent with ischemic monomelic neuropathy involving the right upper extremity. Vascular surgery will follow up with patient and family for decision making regarding possible ligation the fistula.  Electronically signed: Dr. Kerney Elbe

## 2021-05-20 NOTE — Progress Notes (Signed)
Occupational Therapy Note  Patient Details  Name: Carolyn Russo MRN: [id number] Date of Birth: 1950-11-26  Today's Date: 05/20/2021 OT Missed Time: 75 Minutes Missed Time Reason: Unavailable (comment);Other (comment) (pt out of room at HD)  Pt out of room at HD, pt missed 30 mins of therapy, will f/u as time allows to make up missed minutes.     Corinne [facility] 05/20/2021, 4:11 PM

## 2021-05-20 NOTE — Progress Notes (Signed)
Occupational Therapy Session Note  Patient Details  Name: Carolyn Russo MRN: [id number] Date of Birth: Jan 25, 1951  Today's Date: 05/20/2021 OT Individual Time: 8979-1504 OT Individual Time Calculation (min): 61 min    Short Term Goals: Week 3:  OT Short Term Goal 1 (Week 3): STG= LTG  Skilled Therapeutic Interventions/Progress Updates:  Pt greeted in bed finishing up breakfast. Session focus on BADL reeducation with pt currently needing CGA for bed mobility and CGA for stand pivot transfer from EOB>w/c with no AD. Pt transportd to sink where pt completd oral care with set- up assist, MIN A for bathing with pt needing MAX encouragement to attempt task as pt can be self limiting at times, pt noted to urinate when standing for LB bathing, pt transported to toilet with total A with pt completing stand pivot transfer to toilet with MIN A. MOD A for toileting with pt needing most assist for clothing mgmt. Pt transported back to sink with pt needing MAX A for UB dressing and MAX A for LB dressing to don new brief and pants. Pt left up in w/c with alarm belt activated and all needs within reach.   Therapy Documentation Precautions:  Precautions Precautions: Fall, Other (comment) Precaution Comments: L inattention, L hemiparesis, seizure protocol, impaired R UE coordination, R port a cath Restrictions Weight Bearing Restrictions: No General:   Vital Signs:  Pain: Pt reports no pain during session.    Therapy/Group: Individual Therapy  Corinne Ports [facility] 05/20/2021, 12:28 PM

## 2021-05-20 NOTE — Progress Notes (Addendum)
    I was called to evaluate patient for right hand weakness and numbness.  At the time of my evaluation patient was on dialysis and was also being evaluated by neurology.  Patient does have palpable thrill in her fistula in the upper arm as well as a palpable radial artery pulse at the wrist.  There appears to be some component of steal versus IMN but in any case given that this is a first stage fistula I discussed with the patient ligation given that he would need a second and larger procedure prior to use.  Patient desires to speak with her husband.  I have attempted to contact him on his cell phone to no avail.  I have discussed with Dr. Scot Dock and we can plan for surgical ligation of her fistula next week if she is amenable.  Savannah Erbe C. [name], MD [facility] Office: [phone] Pager: [phone]  Addendum: I was able to reach Dr. [name] and we discussed the above-noted case.  I have recommended ligation given that patient would need a second operation prior to having a usable fistula.  At this time he is not amenable to ligation and is requesting specific hand rehabilitation which I will defer to her primary doctors.  We can be available for ligation as needed.  Servando Snare

## 2021-05-20 NOTE — Progress Notes (Signed)
Speech Language Pathology Daily Session Note  Patient Details  Name: Carolyn Russo MRN: [id number] Date of Birth: 13-Mar-1951  Today's Date: 05/20/2021 SLP Individual Time: 1020-1100 SLP Individual Time Calculation (min): 40 min  Short Term Goals: Week 2: SLP Short Term Goal 1 (Week 2): STG=LTG due to short ELOS (8/4)  Skilled Therapeutic Interventions:  Patient seen for skilled ST session with focus on cognitive function goals. Patient required overall modA, sometimes mod-maxA to maintain alertness throughout session. SLP introduced basic level organization task using her daily therapy lists which she keeps, however she required maxA to participate and perform with inconsistent accuracy. She was very fixated on her right shoulder and arm being cold and needed frequent redirection cues. Patient continues to benefit from skilled SLP intervention to maximize cognitive, speech and swallow function goals prior to discharge.  Pain Pain Assessment Pain Scale: 0-10 Pain Score: 0-No pain  Therapy/Group: Individual Therapy  Sonia Baller, MA, CCC-SLP Speech Therapy

## 2021-05-21 LAB — CBC
HCT: 29.5 % — ABNORMAL LOW (ref 36.0–46.0)
Hemoglobin: 9.8 g/dL — ABNORMAL LOW (ref 12.0–15.0)
MCH: 29.6 pg (ref 26.0–34.0)
MCHC: 33.2 g/dL (ref 30.0–36.0)
MCV: 89.1 fL (ref 80.0–100.0)
Platelets: 297 10*3/uL (ref 150–400)
RBC: 3.31 MIL/uL — ABNORMAL LOW (ref 3.87–5.11)
RDW: 16.4 % — ABNORMAL HIGH (ref 11.5–15.5)
WBC: 8.5 10*3/uL (ref 4.0–10.5)
nRBC: 0 % (ref 0.0–0.2)

## 2021-05-21 LAB — GLUCOSE, CAPILLARY
Glucose-Capillary: 96 mg/dL (ref 70–99)
Glucose-Capillary: 103 mg/dL — ABNORMAL HIGH (ref 70–99)
Glucose-Capillary: 171 mg/dL — ABNORMAL HIGH (ref 70–99)
Glucose-Capillary: 152 mg/dL — ABNORMAL HIGH (ref 70–99)
Glucose-Capillary: 140 mg/dL — ABNORMAL HIGH (ref 70–99)
Glucose-Capillary: 148 mg/dL — ABNORMAL HIGH (ref 70–99)

## 2021-05-21 LAB — RENAL FUNCTION PANEL
Albumin: 3.2 g/dL — ABNORMAL LOW (ref 3.5–5.0)
Anion gap: 6 (ref 5–15)
BUN: 12 mg/dL (ref 8–23)
CO2: 30 mmol/L (ref 22–32)
Calcium: 7.8 mg/dL — ABNORMAL LOW (ref 8.9–10.3)
Chloride: 98 mmol/L (ref 98–111)
Creatinine, Ser: 2.55 mg/dL — ABNORMAL HIGH (ref 0.44–1.00)
GFR, Estimated: 19 mL/min — ABNORMAL LOW (ref >60)
Glucose, Bld: 147 mg/dL — ABNORMAL HIGH (ref 70–99)
Phosphorus: 2 mg/dL — ABNORMAL LOW (ref 2.5–4.6)
Potassium: 3.4 mmol/L — ABNORMAL LOW (ref 3.5–5.1)
Sodium: 134 mmol/L — ABNORMAL LOW (ref 135–145)

## 2021-05-21 MED ORDER — DARBEPOETIN ALFA 100 MCG/0.5ML IJ SOSY
PREFILLED_SYRINGE | INTRAMUSCULAR | Status: AC
  Administered 2021-05-21: 100 ug via INTRAVENOUS
  Filled 2021-05-21: qty 0.5

## 2021-05-21 MED ORDER — HEPARIN SODIUM (PORCINE) 1000 UNIT/ML DIALYSIS: 1000.0000 [IU] | INTRAMUSCULAR | Status: DC | PRN

## 2021-05-21 MED ORDER — LIDOCAINE-PRILOCAINE 2.5-2.5 % EX CREA: 1.0000 "application " | TOPICAL_CREAM | CUTANEOUS | Status: DC | PRN

## 2021-05-21 MED ORDER — HEPARIN SODIUM (PORCINE) 1000 UNIT/ML IJ SOLN
INTRAMUSCULAR | Status: AC
  Administered 2021-05-21: 1000 [IU]
  Filled 2021-05-21: qty 4

## 2021-05-21 MED ORDER — SODIUM CHLORIDE 0.9 % IV SOLN: 100.0000 mL | INTRAVENOUS | Status: DC | PRN

## 2021-05-21 MED ORDER — ONDANSETRON HCL 4 MG/2ML IJ SOLN: 4.0000 mg | Freq: Once | INTRAMUSCULAR | Status: AC

## 2021-05-21 MED ORDER — PENTAFLUOROPROP-TETRAFLUOROETH EX AERO: 1.0000 "application " | INHALATION_SPRAY | CUTANEOUS | Status: DC | PRN

## 2021-05-21 MED ORDER — ONDANSETRON HCL 4 MG/2ML IJ SOLN
INTRAMUSCULAR | Status: AC
  Administered 2021-05-21: 4 mg via INTRAVENOUS
  Filled 2021-05-21: qty 2

## 2021-05-21 MED ORDER — ALTEPLASE 2 MG IJ SOLR: 2.0000 mg | Freq: Once | INTRAMUSCULAR | Status: DC | PRN

## 2021-05-21 MED ORDER — LIDOCAINE HCL (PF) 1 % IJ SOLN: 5.0000 mL | INTRAMUSCULAR | Status: DC | PRN

## 2021-05-21 NOTE — Progress Notes (Signed)
[facility] Progress Note   Subjective:  Seen in room. No CP or dyspnea. Plan is for HD later today. Appreciate VVS and neuro assistance, with tentative plan for AVF ligation.  Objective Vitals:   05/20/21 1830 05/20/21 2003 05/21/21 0444 05/21/21 0723  BP:  (!) 151/65 (!) 135/100   Pulse:  75 80   Resp:  18 18   Temp:  98.2 F (36.8 C) 98.5 F (36.9 C)   TempSrc:      SpO2:  99% 96% 94%  Weight: 82.1 kg  81.9 kg   Height:       Physical Exam General: Well appearing, more awake compared to prior visits, NAD. Room air. Heart: RRR; no murmur Lungs: CTAB Abdomen: soft Extremities: No LE edema Dialysis Access: TDC, RUE AVF (deep/1st stage BVT)  Additional Objective Labs: Basic Metabolic Panel: Recent Labs  Lab 05/17/21 1444 05/20/21 1130  NA 135 133*  K 4.7 4.6  CL 98 99  CO2 29 26  GLUCOSE 244* 212*  BUN 28* 42*  CREATININE 5.52* 5.96*  CALCIUM 9.4 9.1  PHOS 3.7 4.4   Liver Function Tests: Recent Labs  Lab 05/17/21 1444 05/20/21 1130  ALBUMIN 3.1* 3.2*   CBC: Recent Labs  Lab 05/17/21 1444 05/20/21 1130  WBC 10.6* 10.4  HGB 9.8* 9.6*  HCT 29.2* 28.8*  MCV 88.5 88.9  PLT 297 324   CBG: Recent Labs  Lab 05/20/21 0558 05/20/21 1628 05/20/21 2113 05/21/21 0604 05/21/21 0744  GLUCAP 104* 145* 145* 96 103*   Medications:   (feeding supplement) PROSource Plus  30 mL Oral BID BM   aspirin EC  81 mg Oral Daily   B-complex with vitamin C  1 tablet Oral Daily   carvedilol  3.125 mg Oral BID WC   Chlorhexidine Gluconate Cloth  6 each Topical BID   Chlorhexidine Gluconate Cloth  6 each Topical Q0600   cholecalciferol  2,000 Units Oral Daily   clopidogrel  75 mg Oral Daily   darbepoetin (ARANESP) injection - DIALYSIS  100 mcg Intravenous Q Fri-HD   diclofenac Sodium  2 g Topical QID   heparin  5,000 Units Subcutaneous Q8H   insulin aspart  0-6 Units Subcutaneous TID WC   insulin glargine  13 Units Subcutaneous Daily    levETIRAcetam  500 mg Oral QHS   melatonin  3 mg Oral QHS   methylphenidate  5 mg Oral BID WC   mometasone-formoterol  2 puff Inhalation BID   pantoprazole  40 mg Oral Daily   polyethylene glycol  17 g Oral Daily   rosuvastatin  20 mg Oral Daily   senna-docusate  2 tablet Oral BID   sevelamer carbonate  800 mg Oral TID WC   traZODone  100 mg Oral QHS    Dialysis Orders: MWF @ [facility] 4 hrs 180NRe 400/Autoflow 1.5 86.5 kg 2.0K/2.0 Ca TDC, heparin 2000 units bolus - Mircera 100 mcg IV q 2 weeks (last dose 04/14/2021 - Hectorol 5 mcg IV TIW   Assessment/Plan: 1. Acute R subcortical CVA: Back in CIR as of 7/15, transiently back to [facility] 7/14-7/15 with recurrent stroke like symptoms, likely watershed v. hypotensive issue per neuro. 2. Acute LGIB: Hx hemorrhoidal bleeding in past - s/p 2U PRBCs 6/26. S/p EGD/flex sig without clear bleeding source. Plavix and ASA held, now resumed. 3. ESRD: Back to usual MWF schedule - for HD later today. No added heparin. 4. Dialysis Access: S/p 1st stage BVT 7/8 by Dr. Scot Dock -  mild steal symptoms, tentative plan for ligation per VVS notes. 5. HTN/volume: Variable BP - trying to avoid intradialytic hypotension, she is well below OP dry weight.  UF as tolerated.  6. Anemia: Hgb 9.6 - continue Aranesp 17mcg q Friday. 7. Secondary hyperparathyroidism: Hectoral on hold for low PTH x 2. Binder changed from Renvela pills to powder d/t issues swallowing pills. Ca/Phos ok for now. 8. Nutrition: Alb low, continue supplements. 9. T2DM: Per primary. 10. Insomnia: Primary trialing ritalin during day and trazodone/melatonin to get sleep/wake cycle back on track. Watch BP with stimulants.  Veneta Penton, PA-C 05/21/2021, 10:01 AM  [employer]

## 2021-05-21 NOTE — Progress Notes (Signed)
Occupational Therapy Session Note  Patient Details  Name: Carolyn Russo MRN: [id number] Date of Birth: 12/09/1950  Today's Date: 05/21/2021 OT Individual Time: 1130-1200 OT Individual Time Calculation (min): 30 min   Short Term Goals: Week 2:  OT Short Term Goal 1 (Week 2): Pt will complete 2/3 toileting tasks with no more than Min A OT Short Term Goal 1 - Progress (Week 2): Met OT Short Term Goal 2 (Week 2): Pt will perform LB dress with Mod A at sit <> stand level OT Short Term Goal 2 - Progress (Week 2): Progressing toward goal OT Short Term Goal 3 (Week 2): Pt will use R hand to self feed with AE PRN with Supervision OT Short Term Goal 3 - Progress (Week 2): Met OT Short Term Goal 4 (Week 2): Pt will consistently perform ADL transfers with CGA and LRAD OT Short Term Goal 4 - Progress (Week 2): Met  Skilled Therapeutic Interventions/Progress Updates:    Pt greeted seated in wc, awake, and agreeable to OT treatment session focused on self-care retraining. UB bathing/dressing completed seated in wc at the sink. Focus on functional use of UE's with wringing out wash cloths, opening containers, and maintaining grasp on wash cloths to wash upper body. Pt often asking for assistance prior to trying things herself, requiring encouragement from OT. Pt needed min A to thread button up shirt. Tried working on fine motor control to button shirt, but pt unable to complete with small buttons. Pt left seated in wc at end of session with alarm on, call bell in reach, and needs met.   Therapy Documentation Precautions:  Precautions Precautions: Fall, Other (comment) Precaution Comments: L inattention, L hemiparesis, seizure protocol, impaired R UE coordination, R port a cath Restrictions Weight Bearing Restrictions: No Pain: Pain Assessment Pain Scale: 0-10 Pain Score: 0-No pain   Therapy/Group: Individual Therapy  Valma Cava 05/21/2021, 12:02 PM

## 2021-05-21 NOTE — Progress Notes (Signed)
PROGRESS NOTE     Subjective/Complaints:  Leg pain/cramping better with voltaren- didn't need Skelaxin.   Slept "great" per pt.  R hand itching this am per pt.  LBM 2 days ago.   Saw Dr Doren Custard this AM- she didn't remember exactly what was said, but doing better memory- wise- per Dr Doren Custard note, and Neuro and other vascular, feel like pt having unusual side effect- and looks like needs to undo/ligate the fistula to take this "out of equation". I did discuss this with pt and husband- I explained will take time, likely, for strength, sensation to return- possibly, but not guaranteed. But pretty much guaranteed to NOT get better if don't do it.   ROS:  Pt denies SOB, abd pain, CP, N/V/C/D, and vision changes     Objective:       Physical Exam: Vital Signs    Vitals with BMI 05/21/2021 05/21/2021 05/21/2021  Height - - -  Weight - 178 lbs 9 oz -  BMI - 16.10 -  Systolic [phone]  Diastolic 86 65 54  Pulse 79 69 69                      General: awake, alert, appropriate, sitting up eating breakfast- more awake, more initiative in asking questions; conveying info; husband at bedside; NAD HENT: conjugate gaze; oropharynx moist CV: regular rate; no JVD Pulmonary: CTA B/L; no W/R/R- good air movement GI: soft, NT, ND, (+)BS Psychiatric: appropriate Neurological: alert- less delayed; also no change in R hand from yesterday- but kept scratching it- likely due to sensory changes.   Musculoskeletal:      no Pain/TTP with ROM of R wrist this AM or R hand/fingers- R hand very slightly cooler than L hand; trace swelling on dorsum of R wrist which is new. No erythema.  R wrist/hand looks stable today, however also rechecked strength- R grip 2+/5 in R hand and Finger abd 2/5 at best Skin:    General: Skin is warm and dry. Neurological:    Comments: alert, follows commands.   Fair insight and awareness. Provided  biographical information. Normal language, speech slightly slurred. Mild left central 7. LUE 2 to 2+/5 deltoid, 3/5 biceps, triceps and 3+/5 wrist and HI. LLE 3-4/5 prox to distal. RUE 4/5 and RLE 5/5 prox to distal.  Tone is normal in the right upper limb no sensory deficits noted--motor/sensory exam stable today.  also cannot do finger touching to each finger/thumb- not able to do any dexterity movements, or hold pen.   NO change on dexterity this AM   Assessment/Plan: 1. Functional deficits which require 3+ hours per day of interdisciplinary therapy in a comprehensive inpatient rehab setting. Physiatrist is providing close team supervision and 24 hour management of active medical problems listed below. Physiatrist and rehab team continue to assess barriers to discharge/monitor patient progress toward functional and medical goals   Care Tool:   Bathing   Body parts bathed by patient: Right arm, Left arm, Chest, Abdomen, Front perineal area, Right upper leg, Left upper leg    Body parts bathed by helper: Right lower leg, Left lower leg, Buttocks  Bathing assist Assist Level: Minimal Assistance - Patient > 75%    Upper Body Dressing/Undressing Upper body dressing What is the patient wearing?: Pull over shirt   Upper body assist Assist Level: Minimal Assistance - Patient > 75%   Lower Body Dressing/Undressing Lower body dressing       What is the patient wearing?: Pants, Incontinence brief        Lower body assist Assist for lower body dressing: Minimal Assistance - Patient > 75%     Toileting Toileting   Toileting assist Assist for toileting: Moderate Assistance - Patient 50 - 74%    Transfers Chair/bed transfer   Transfers assist     Chair/bed transfer assist level: Minimal Assistance - Patient > 75%    Locomotion Ambulation     Ambulation assist       Assist level: Minimal Assistance - Patient > 75% Assistive device: Walker-rolling Max distance: 50'    Walk  10 feet activity     Assist     Assist level: Minimal Assistance - Patient > 75% Assistive device: Walker-rolling    Walk 50 feet activity     Assist Walk 50 feet with 2 turns activity did not occur: Safety/medical concerns   Assist level: Minimal Assistance - Patient > 75% Assistive device: Walker-rolling      Walk 150 feet activity     Assist Walk 150 feet activity did not occur: Safety/medical concerns        Walk 10 feet on uneven surface activity     Assist Walk 10 feet on uneven surfaces activity did not occur: Safety/medical concerns         Wheelchair         Assist Will patient use wheelchair at discharge?:  (TBD) Type of Wheelchair: Manual   Wheelchair assist level: Dependent - Patient 0% Max wheelchair distance: 150'      Wheelchair 50 feet with 2 turns activity       Assist           Assist Level: Dependent - Patient 0%    Wheelchair 150 feet activity       Assist       Assist Level: Dependent - Patient 0%     Medical Problem List and Plan: 1.  Left side weakness secondary to nonhemorrhagic right basal ganglia infarction             -patient may shower Reviewed noted 2 day interrupted stay 7/13--7/15 for altered MS , no new CVA identified             con't OT, OT and SLP- will add Ritalin for lethargy and initiation- however due to BP being on high side, will do 2.5 mg with breakfast and lunch.  Continue CIR- PT, OT and SLP  2.  Impaired mobility: -DVT/anticoagulation:  continue SCD             -antiplatelet therapy:ASA             Plavix restarted. 3. Right hip bursitis: continue Tramadol 50 mg every 12 hours as needed, ice, ROM, lidocaine patch  7/27- R wrist pain better- con't regimen  7/28- having leg cramping- if voltaren doesn't help (gel) will try Skelaxin since less sedating muscle relaxant. Nursing ot let me know.   7/29- no skelaxin needed- con't voltaren prn for cramping in legs as well.  4. Anxiety/stroke poor  initiation: continue Xanax 0.5 mg 3 times daily as needed.  Provide emotional support  7/12- per husband, concerned about flat affect- will d/w possible depression with pt- if so, will start SSRI.             7/20- BP looks OK- will increase to 5 mg BID  7/22- more awake, alert this AM than usual- more initiation- con't regimen- BP controlled for her. 7/26- more awake today (better than yesterday) slept better- con't regimen  7/29- so much more initiation in conveying info this AM-con't regimen             -antipsychotic agents: N/A 5. Neuropsych: This patient is ?capable of making decisions on her own behalf. 6. Skin/Wound Care: Routine skin checks 7. Fluids/Electrolytes/Nutrition: Routine in and outs with follow-up chemistries 8.  Acute lower GI bleed with blood loss anemia.  Follow-up GI services.  Resume ASA as above. - Continue Protonix 40 mg twice daily 7/6: hgb 9.6--monitor for gross bleeding 7/11- last Hb stable at 9.8- will recheck this week             7/12- Hb came back at 9.7- stable- con't regimen             7/13- Hb up to 11.1- will con't to monitor  7/17- Hb 10.6- con't regimen  7/22- Hb down slightly to 9.3- con't Aranesp.  On Aranesp q Friday for anemia or chronic disease due to ESRD  7/27- will see if can get labs in HD? Since she's a very hard stick otherwise.  9.  Chronic diastolic congestive heart failure.  Monitor for any signs of fluid overload.  Continue Lasix 40-80 mg Monday Wednesday Friday             -daily weights, have been stable             7/12- weight bounced up 1 kg today, however was this level 2-3 days ago, so think yesterday's weight might have been slightly off- will monitor  7/22- weight was up yesterday to 86 kg, but down to 80.8 kg today- back to baseline- con't regimen 10.  Newly Dx'd End-stage renal disease.  Hemodialysis as per renal services             7/12- questionable steal syndrome since Fistula placement- will monitor per  Vascular- to see if  needs additional intervention  7/19- R hand still cooler to touch- also has decreased dexterity- will likely need EMG after d/c.   7/22- had HD yesterday, not Wednesday when was due- per Renal, will get back on track.   7/28- didn't get HD yesterday- to get today.  11.  Hypertension.  Increase Coreg to 12.5 mg twice daily. Aggressive filtration with ultradialysis tomorrow             7/11- HD today- BP 170s/90s this AM- per renal.             7/12- BP dropped to 70s/50s with HD yesterday- back up to 150s/80s today- con't reigmen  7/17- BP 150s/160s- per renal- will only start 2.5 mg of Ritalin due to BP issues- will monitor closely. - now on 5 mg BID Vitals:   05/21/21 1530 05/21/21 1640  BP: 125/65 130/86  Pulse: 69 79  Resp: 18 18  Temp: 97.6 F (36.4 C)   SpO2: 98% 100%    7/29- BP 106Y-694W systolic- con't to monitor 12.  Asthma.  Continue inhalers as directed             - was getting Home O2 prior to admission- will  likely need to go home on 2L O2-   Was trying to get at home, but hadn't yet.             7/1- con't nebs prn, home O2 ?PRN only             7/3 lungs very clear today, sats 99% on RA. Some of SOB may be behaviorally related. Reassured pt today that lungs sounded fine.                         -encouraged FV/IS, OOB             7/7: ordered CXT-stable, ordered incentive spirometer 13.Hyperlipidemia.Crestor 14.Obesity.BMI 34.31.Dietery follow up 15.  Diabetes mellitus.hemoglobin AIc 5.2.  CBGs 211-310. Increase Lantus to 13U BID.  7/17- BG's don't look as good- >200- will follow trend before making change yet.   7/20- increased Lantus to 8 units daily- will monitor- increased from 5 units.   7/22- Bgs 139 to 251 in last 24 hours- increase Lantus to 10 units daily  7/25- BG's 163-273- will increase lantus from 10 units to 13 units- was on 13 units BID_ for some reason it was reduced??? Will see if needs BID if this dose doesn't work well.   7/26-  BG's -hasn't had a chance to get better- just got AM dose today- first dose of increased dose. Will monitor  7/27- BG's 103-202- much improved- had been on Lantus 13 units BID when got here- so I'm not sure what changed, when I was on vacation?not clear- but don't feel comfortable increasing since lows low 100s-   -7/29- BG's controlled- overall- con't regimen CBG (last 3)  Recent Labs    05/21/21 1014 05/21/21 1201 05/21/21 1643  GLUCAP 171* 152* 140*    16. Leukocytosis: trending down, repeat Monday             7/11- last WBC was 13k- will recheck in HD today.              7/13- WBC down to 10.1- will monitor  7/16- Last WBC 12.1- was 12.8 last week- bounced down and went back up again?- con't to monitor, labs in HD  7/22- WBC stable at 12.1- don't see a cause?   7/25- WBC reduced to 10.6- con't to monitor 17. Constipation: KUB shows moderate stool burden. Increase senna-docusate to BID.              7/13- LBM yesterday- con't regimen  7/19- LBM 2-3 days ago- will give a dose of Sorbitol at lunch time. 7/20- will give another dose of Sorbitol since cannot give Mg- early this AM- so might go before HD- KUB shows moderate volume of stool throughout colon- could be cause of LUQ pain   7/28- LBM yesterday- con't regimen  18. Fistula placement: successful, new fistula accessed without complications.  7/20- possible steal syndrome of RUE- cooler hand on R- might need EMG after d/c.   7/29- Dr Scot Dock note suggested ligating fistula- will likely need another? However this one is causing problems with strength/sensation/dexterity- plan for possibly next week.  19. Left shoulder pain: ordered lidocaine patch and voltaren gel. 20. Lethargy             7/12- is much improved today- will con't to monitor- CT and CXR of chest were stable             7/16- will readd Melatonin for sleep- never had the  chance to use it.   7/17- didn't sleep at all- hopefully Ritalin due day 2.5 mg BID- will help-  cannot use Amantadine secondary to HD.   7/22- tolerating Ritalin 5 mg BID= doing better   21. Insomnia/mixing of sleep wake cycle  7/20- will try Trazodone 50 mg QHS and see if can get back on good sleep cycle? Encouraged pt/staff to try and keep her awake more during day.   7/21- didn't sleep more than 1 hour overnight- will increase trazodone to 75 mg QHS. More awake this AM  7/22- slept a few hours last night- slightly better- will increase Trazodone to 100 mg at 8pm nightly to reduce sleepiness in AM.  22.  Right upper extremity discomfort, difficult to get a good history.  Today she is complaining more of wrist pain when using the walker.  Her exam is unremarkable no history of trauma.  We will trial some Voltaren gel to the right wrist 4 times per day  7/25- per renal note, still c/o R hand/wrist pain- has been on/off since fistula placed.   7/27- denies pain- con't regimen  7/29- pain better but due to neurological issues from RUE fistula- Dr Scot Dock to look at ligating fistula next week-    I spent a total of 37 minutes on visit- >50% of coordination of care- d/w pt/husband and reviewing chart with husband.

## 2021-05-21 NOTE — Progress Notes (Addendum)
VASCULAR SURGERY:  Still with persistent weakness in the right hand with minimal pain.  On exam she has a good thrill in her first stage basilic vein transposition.  She has a palpable radial pulse and a warm well-perfused hand.  She had a first stage right basilic vein transposition under axillary block on 04/30/2021.  She really did not have any significant pain or weakness immediately postop.  We saw her on postoperative day #1 and she had no symptoms and we signed off and arrange follow-up.  She subsequently developed gradual onset of some weakness and paresthesias in the right hand.  I evaluated her last week and Dr. [name] evaluated her again yesterday.  She has no evidence of steal given that she has excellent Doppler flow even in the palmar arch and a palpable radial pulse.  Regardless, I agree that ischemic monomelic neuropathy is in the differential diagnosis.  This is a very rare complication and in the few cases that I have seen the patient developed immediate pain and weakness.  This seemed to occur in a more delayed fashion.  Regardless I would agree with Dr. [name] that it would probably be best simply to ligate the fistula to take this out of the equation.  I have again discussed this with her and she will consider this.  I will also try to get hold of her husband today.  Gae Gallop, MD 7:35 AM

## 2021-05-21 NOTE — Progress Notes (Signed)
Physical Therapy Session Note  Patient Details  Name: Carolyn Russo MRN: [id number] Date of Birth: [birth year]  Today's Date: 05/21/2021 PT Individual Time: 0830-0900 PT Individual Time Calculation (min): 30 min   Short Term Goals: Week 2:  PT Short Term Goal 1 (Week 2): =LTG due to ELOS  Skilled Therapeutic Interventions/Progress Updates: Pt presented in bed with husband present for family education. PTA provided update on pt's current mobility status and agreed that emphasis will be placed on stair management this session. Pt performed bed mobility with supervision and increased time and PTA donned pants for time management. WIth frequent re-direction pt performed STS with CGA and PTA pulled pants over hips. Pt then performed ambulatory transfer to w/c and pt transported to rehab gym for time management. PTA demonstrated technique using R rail only to simulate home as husband stated that if L rail was installed pt would still be unable to reach both at the same time. Pt ascended/descended x 4 steps with R rail via sidestep with pt able to perform task with CGA however requiring mod verbal cues for sequencing. At top of stairs pt states need to "pee", pt was able to descend x4 CGA with verbal cues and pt tranported back to room. Pt requiring more than a reasonable amount of time to initiate task and was ultimately handed off to COTA for next session.      Therapy Documentation Precautions:  Precautions Precautions: Fall, Other (comment) Precaution Comments: L inattention, L hemiparesis, seizure protocol, impaired R UE coordination, R port a cath Restrictions Weight Bearing Restrictions: No General: PT Amount of Missed Time (min): 60 Minutes PT Missed Treatment Reason: Patient fatigue (pt not responding to PT, NT and RN in for vitals and see note) Vital Signs:  Pain: Pain Assessment Pain Scale: 0-10 Pain Score: 0-No pain Mobility:   Locomotion :    Trunk/Postural Assessment :     Balance:   Exercises:   Other Treatments:      Therapy/Group: Individual Therapy  Delmon Andrada 05/21/2021, 11:25 AM

## 2021-05-21 NOTE — Progress Notes (Signed)
Occupational Therapy Session Note  Patient Details  Name: Carolyn Russo MRN: [id number] Date of Birth: June 21, 1951  Today's Date: 05/21/2021 OT Individual Time: 8638-1771 OT Individual Time Calculation (min): 35 min    Short Term Goals: Week 3:  OT Short Term Goal 1 (Week 3): STG= LTG  Skilled Therapeutic Interventions/Progress Updates:  Pt received from PTA with pts husband present for family education. Education provided on pts current level of assist for bathing, dressing and toileting. Pt able to ambulate into BR with RW and CGA with demo provided to husband of how to ambulate with pt, husband assisted pt with 1/3 parts of toileting needing MOD A to pull pants and brief back up to waist line. Pts husband demonstrated ability to ambulate back to w/c with pt with cues provided for effective hand placement on gait belt. Provided pt with RUE HEP for home, demonstration and handout provided on all therex. Pt left seated in w/c with alarm belt activated and all needs within reach.   Therapy Documentation Precautions:  Precautions Precautions: Fall, Other (comment) Precaution Comments: L inattention, L hemiparesis, seizure protocol, impaired R UE coordination, R port a cath Restrictions Weight Bearing Restrictions: No   Pain: Pt reports no pain during session.    Therapy/Group: Individual Therapy  Corinne Ports [facility] 05/21/2021, 1:08 PM

## 2021-05-21 NOTE — Progress Notes (Signed)
Speech Language Pathology Daily Session Note  Patient Details  Name: Carolyn Russo MRN: [id number] Date of Birth: February 02, 1951  Today's Date: 05/21/2021 SLP Individual Time: 0800-0830 SLP Individual Time Calculation (min): 30 min  Short Term Goals: Week 2: SLP Short Term Goal 1 (Week 2): STG=LTG due to short ELOS (8/4)  Skilled Therapeutic Interventions:   Patient seen with husband present for skilled ST session focusing on patient and family education in anticipation of discharge home next week. Patient was alert and participatory, breakfast in front of her. SLP reviewed swallow safety, diet consistency, cognition and speech production. SLP to bring diet consistency information for husband later today. SLP discussed that patient's main difficulty with her ability to maintain clear speech as well as to complete cognitively demanding tasks, is her lethargy. When she is awake and alert, she is able to be understood 100% at conversational level (with mild decreased overall quality, precision of speech articulation). She has been oriented to time, place, situation and has demonstrated ability to recall recent events (even medical events/discussions with MD, therapists, etc). Overall, she is demonstrating delayed cognitive processing with inconsistency in alertness a primary factor. Patient continues to benefit from skilled SLP intervention to maximize cognitive, speech and swallow function prior to discharge.  Pain Pain Assessment Pain Scale: 0-10 Pain Score: 0-No pain  Therapy/Group: Individual Therapy  Sonia Baller, MA, CCC-SLP Speech Therapy

## 2021-05-21 NOTE — Progress Notes (Signed)
Physical Therapy Note  Patient Details  Name: Carolyn Russo MRN: [id number] Date of Birth: 11-27-1950 Today's Date: 05/21/2021    Pt asleep in w/c, not arousing to verbal and tactile stimuli.  RN in for BS of 171 and NT taking vitals of 155/92 .  Continued attempts to arouse, but pt not responsive although opened eyes but not maintaining or focusing.  Pt remained in chair w/ nursing monitoring.   Ladoris Gene 05/21/2021, 10:31 AM

## 2021-05-22 LAB — GLUCOSE, CAPILLARY
Glucose-Capillary: 114 mg/dL — ABNORMAL HIGH (ref 70–99)
Glucose-Capillary: 162 mg/dL — ABNORMAL HIGH (ref 70–99)
Glucose-Capillary: 197 mg/dL — ABNORMAL HIGH (ref 70–99)
Glucose-Capillary: 215 mg/dL — ABNORMAL HIGH (ref 70–99)

## 2021-05-22 NOTE — Progress Notes (Signed)
Speech Language Pathology Daily Session Note  Patient Details  Name: Carolyn Russo MRN: [id number] Date of Birth: [birth year]  Today's Date: 05/22/2021 SLP Individual Time: 1433-1500 SLP Individual Time Calculation (min): 27 min  Short Term Goals: Week 2: SLP Short Term Goal 1 (Week 2): STG=LTG due to short ELOS (8/4)  Skilled Therapeutic Interventions:Skilled ST services focused on cognitive skills. Pt was alert and pt's husband was present for the initial 5 minutes for treatment. SLP facilitated basic problem solving, sustained attention and error awareness in 3-4 step picture sequencing task. Pt demonstrated mod I to sequence 3 step pictures and was able to sequence 1 out 4, 4 step pictures. Performance was impacted by fading alertness 1/2 way through session, reduced awareness of errors, difficulty remaining on topic/sustained attention (talk about what pt would do verse the pictures) and problem solving. Pt was left in room with call bell within reach and chair alarm set. SLP recommends to continue skilled services     Pain Pain Assessment Pain Score: 0-No pain  Therapy/Group: Individual Therapy  Harold Moncus  [facility] 05/22/2021, 3:48 PM

## 2021-05-22 NOTE — Progress Notes (Signed)
PROGRESS NOTE     Subjective/Complaints: No complaints this morning Says she had BM yesterday and today Makes good eye contact Appreciate nephrology note  ROS:  Pt denies SOB, abd pain, CP, N/V/C/D, and vision changes, constipation  Objective:       Physical Exam: Vital Signs    Vitals with BMI 05/22/2021 05/22/2021 05/21/2021  Height - - -  Weight - 177 lbs -  BMI - 63.33 -  Systolic 545 625 638  Diastolic 70 73 72  Pulse 77 78 72  Gen: no distress, normal appearing HEENT: oral mucosa pink and moist, NCAT Cardio: Reg rate Chest: normal effort, normal rate of breathing Abd: soft, non-distended Ext: no edema Psych: pleasant, normal affect  Musculoskeletal:      no Pain/TTP with ROM of R wrist this AM or R hand/fingers- R hand very slightly cooler than L hand; trace swelling on dorsum of R wrist which is new. No erythema.  R wrist/hand looks stable today, however also rechecked strength- R grip 2+/5 in R hand and Finger abd 2/5 at best Skin:    General: Skin is warm and dry. Neurological:    Comments: alert, follows commands.   Fair insight and awareness. Provided biographical information. Normal language, speech slightly slurred. Mild left central 7. LUE 2 to 2+/5 deltoid, 3/5 biceps, triceps and 3+/5 wrist and HI. LLE 3-4/5 prox to distal. RUE 4/5 and RLE 5/5 prox to distal.  Tone is normal in the right upper limb no sensory deficits noted--motor/sensory exam stable today.  also cannot do finger touching to each finger/thumb- not able to do any dexterity movements, or hold pen.   NO change on dexterity this AM   Assessment/Plan: 1. Functional deficits which require 3+ hours per day of interdisciplinary therapy in a comprehensive inpatient rehab setting. Physiatrist is providing close team supervision and 24 hour management of active medical problems listed below. Physiatrist and rehab team continue to assess  barriers to discharge/monitor patient progress toward functional and medical goals   Care Tool:   Bathing   Body parts bathed by patient: Right arm, Left arm, Chest, Abdomen, Front perineal area, Right upper leg, Left upper leg    Body parts bathed by helper: Right lower leg, Left lower leg, Buttocks    Bathing assist Assist Level: Minimal Assistance - Patient > 75%    Upper Body Dressing/Undressing Upper body dressing What is the patient wearing?: Pull over shirt   Upper body assist Assist Level: Minimal Assistance - Patient > 75%   Lower Body Dressing/Undressing Lower body dressing       What is the patient wearing?: Pants, Incontinence brief        Lower body assist Assist for lower body dressing: Minimal Assistance - Patient > 75%     Toileting Toileting   Toileting assist Assist for toileting: Moderate Assistance - Patient 50 - 74%    Transfers Chair/bed transfer   Transfers assist     Chair/bed transfer assist level: Minimal Assistance - Patient > 75%    Locomotion Ambulation     Ambulation assist       Assist level: Minimal Assistance - Patient > 75% Assistive  device: Walker-rolling Max distance: 50'    Walk 10 feet activity     Assist     Assist level: Minimal Assistance - Patient > 75% Assistive device: Walker-rolling    Walk 50 feet activity     Assist Walk 50 feet with 2 turns activity did not occur: Safety/medical concerns   Assist level: Minimal Assistance - Patient > 75% Assistive device: Walker-rolling      Walk 150 feet activity     Assist Walk 150 feet activity did not occur: Safety/medical concerns        Walk 10 feet on uneven surface activity     Assist Walk 10 feet on uneven surfaces activity did not occur: Safety/medical concerns         Wheelchair         Assist Will patient use wheelchair at discharge?:  (TBD) Type of Wheelchair: Manual   Wheelchair assist level: Dependent - Patient 0% Max wheelchair  distance: 150'      Wheelchair 50 feet with 2 turns activity       Assist           Assist Level: Dependent - Patient 0%    Wheelchair 150 feet activity       Assist       Assist Level: Dependent - Patient 0%     Medical Problem List and Plan: 1.  Left side weakness secondary to nonhemorrhagic right basal ganglia infarction             -patient may shower Reviewed noted 2 day interrupted stay 7/13--7/15 for altered MS , no new CVA identified             con't OT, OT and SLP- will add Ritalin for lethargy and initiation- however due to BP being on high side, will do 2.5 mg with breakfast and lunch.  Continue CIR- PT, OT and SLP  2.  Impaired mobility: -DVT/anticoagulation:  continue SCD             -antiplatelet therapy:ASA             Plavix restarted. 3. Right hip bursitis: continue Tramadol 50 mg every 12 hours as needed, ice, ROM, lidocaine patch  7/27- R wrist pain better- con't regimen  7/28- having leg cramping- if voltaren doesn't help (gel) will try Skelaxin since less sedating muscle relaxant. Nursing ot let me know.   7/30: continue voltaren gel 4. Anxiety/stroke poor initiation: continue Xanax 0.5 mg 3 times daily as needed.  Provide emotional support             7/12- per husband, concerned about flat affect- will d/w possible depression with pt- if so, will start SSRI.             7/20- BP looks OK- will increase to 5 mg BID  7/22- more awake, alert this AM than usual- more initiation- con't regimen- BP controlled for her. 7/26- more awake today (better than yesterday) slept better- con't regimen  7/29- so much more initiation in conveying info this AM-con't regimen             -antipsychotic agents: N/A 5. Neuropsych: This patient is ?capable of making decisions on her own behalf. 6. Skin/Wound Care: Routine skin checks 7. Fluids/Electrolytes/Nutrition: Routine in and outs with follow-up chemistries 8.  Acute lower GI bleed with blood loss anemia.   Follow-up GI services.  Resume ASA as above. - Continue Protonix 40 mg twice daily 7/6: hgb 9.6--monitor for  gross bleeding 7/11- last Hb stable at 9.8- will recheck this week             7/12- Hb came back at 9.7- stable- con't regimen             7/13- Hb up to 11.1- will con't to monitor  7/17- Hb 10.6- con't regimen  7/22- Hb down slightly to 9.3- con't Aranesp.  On Aranesp q Friday for anemia or chronic disease due to ESRD  7/27- will see if can get labs in HD? Since she's a very hard stick otherwise.  9.  Chronic diastolic congestive heart failure.  Monitor for any signs of fluid overload.  Continue Lasix 40-80 mg Monday Wednesday Friday             -daily weights, have been stable             7/12- weight bounced up 1 kg today, however was this level 2-3 days ago, so think yesterday's weight might have been slightly off- will monitor  7/22- weight was up yesterday to 86 kg, but down to 80.8 kg today- back to baseline- con't regimen 10.  Newly Dx'd End-stage renal disease.  Hemodialysis as per renal services             7/12- questionable steal syndrome since Fistula placement- will monitor per Vascular- to see if  needs additional intervention  7/19- R hand still cooler to touch- also has decreased dexterity- will likely need EMG after d/c.   7/22- had HD yesterday, not Wednesday when was due- per Renal, will get back on track.   7/28- didn't get HD yesterday- to get today.  11.  Hypertension.  Increase Coreg to 12.5 mg twice daily. Aggressive filtration with ultradialysis tomorrow             7/11- HD today- BP 170s/90s this AM- per renal.             7/12- BP dropped to 70s/50s with HD yesterday- back up to 150s/80s today- con't reigmen  7/17- BP 150s/160s- per renal- will only start 2.5 mg of Ritalin due to BP issues- will monitor closely. - now on 5 mg BID Vitals:   05/22/21 0451 05/22/21 0808  BP: (!) 142/73 140/70  Pulse: 78 77  Resp: 17   Temp: (!) 97.4 F (36.3 C)   SpO2:  96%     7/29- BP [id number] systolic- con't to monitor 12.  Asthma.  Continue inhalers as directed             - was getting Home O2 prior to admission- will likely need to go home on 2L O2-   Was trying to get at home, but hadn't yet.             7/1- con't nebs prn, home O2 ?PRN only             7/3 lungs very clear today, sats 99% on RA. Some of SOB may be behaviorally related. Reassured pt today that lungs sounded fine.                         -encouraged FV/IS, OOB             7/7: ordered CXT-stable, ordered incentive spirometer 13.Hyperlipidemia.Crestor 14.Obesity.BMI 34.31.Dietery follow up 15.  Diabetes mellitus.hemoglobin AIc 5.2.  CBGs 211-310. Increase Lantus to 13U BID.  7/17- BG's don't look as good- >200- will follow trend before making  change yet.   7/20- increased Lantus to 8 units daily- will monitor- increased from 5 units.   7/22- Bgs 139 to 251 in last 24 hours- increase Lantus to 10 units daily  7/25- BG's 163-273- will increase lantus from 10 units to 13 units- was on 13 units BID_ for some reason it was reduced??? Will see if needs BID if this dose doesn't work well.   7/26- BG's -hasn't had a chance to get better- just got AM dose today- first dose of increased dose. Will monitor  7/27- BG's 103-202- much improved- had been on Lantus 13 units BID when got here- so I'm not sure what changed, when I was on vacation?not clear- but don't feel comfortable increasing since lows low 100s-   -7/29- BG's controlled- overall- con't regimen CBG (last 3)  Recent Labs    05/21/21 2100 05/22/21 0603 05/22/21 1137  GLUCAP 148* 114* 162*    16. Leukocytosis: trending down, repeat Monday             7/11- last WBC was 13k- will recheck in HD today.              7/13- WBC down to 10.1- will monitor  7/16- Last WBC 12.1- was 12.8 last week- bounced down and went back up again?- con't to monitor, labs in HD  7/22- WBC stable at 12.1- don't see a cause?   7/25- WBC reduced to 10.6-  con't to monitor 17. Constipation: KUB shows moderate stool burden. Increase senna-docusate to BID.              7/13- LBM yesterday- con't regimen  7/19- LBM 2-3 days ago- will give a dose of Sorbitol at lunch time. 7/20- will give another dose of Sorbitol since cannot give Mg- early this AM- so might go before HD- KUB shows moderate volume of stool throughout colon- could be cause of LUQ pain   7/28- LBM yesterday- con't regimen  18. Fistula placement: successful, new fistula accessed without complications.  7/20- possible steal syndrome of RUE- cooler hand on R- might need EMG after d/c.   7/29- Dr Scot Dock note suggested ligating fistula- will likely need another? However this one is causing problems with strength/sensation/dexterity- plan for possibly next week.  19. Left shoulder pain: ordered lidocaine patch and voltaren gel. 20. Lethargy             7/12- is much improved today- will con't to monitor- CT and CXR of chest were stable             7/16- will readd Melatonin for sleep- never had the chance to use it.   7/17- didn't sleep at all- hopefully Ritalin due day 2.5 mg BID- will help- cannot use Amantadine secondary to HD.   7/22- tolerating Ritalin 5 mg BID= doing better   21. Insomnia/mixing of sleep wake cycle  7/20- will try Trazodone 50 mg QHS and see if can get back on good sleep cycle? Encouraged pt/staff to try and keep her awake more during day.   7/21- didn't sleep more than 1 hour overnight- will increase trazodone to 75 mg QHS. More awake this AM  7/22- slept a few hours last night- slightly better- will increase Trazodone to 100 mg at 8pm nightly to reduce sleepiness in AM.  22.  Right upper extremity discomfort, difficult to get a good history.  Today she is complaining more of wrist pain when using the walker.  Her exam is unremarkable no history  of trauma.  We will trial some Voltaren gel to the right wrist 4 times per day  7/25- per renal note, still c/o R hand/wrist  pain- has been on/off since fistula placed.   7/27- denies pain- con't regimen  7/29- pain better but due to neurological issues from RUE fistula- Dr Scot Dock to look at ligating fistula next week-

## 2021-05-22 NOTE — Progress Notes (Addendum)
[facility] Progress Note   Subjective:   Seen in room - no CP/dyspnea. Says she is cold.  Objective Vitals:   05/21/21 1640 05/21/21 2029 05/22/21 0451 05/22/21 0808  BP: 130/86 (!) 147/72 (!) 142/73 140/70  Pulse: 79 72 78 77  Resp: 18 17 17    Temp:  98.3 F (36.8 C) (!) 97.4 F (36.3 C)   TempSrc:  Oral Oral   SpO2: 100% 98% 96%   Weight:   80.3 kg   Height:       Physical Exam General: Well appearing, awake, NAD. Room air. Heart: RRR; no murmur Lungs: CTAB Abdomen: soft Extremities: No LE edema Dialysis Access: TDC, RUE AVF (deep/1st stage BVT)   Additional Objective Labs: Basic Metabolic Panel: Recent Labs  Lab 05/17/21 1444 05/20/21 1130 05/21/21 1408  NA 135 133* 134*  K 4.7 4.6 3.4*  CL 98 99 98  CO2 29 26 30   GLUCOSE 244* 212* 147*  BUN 28* 42* 12  CREATININE 5.52* 5.96* 2.55*  CALCIUM 9.4 9.1 7.8*  PHOS 3.7 4.4 2.0*   Liver Function Tests: Recent Labs  Lab 05/17/21 1444 05/20/21 1130 05/21/21 1408  ALBUMIN 3.1* 3.2* 3.2*   CBC: Recent Labs  Lab 05/17/21 1444 05/20/21 1130 05/21/21 1408  WBC 10.6* 10.4 8.5  HGB 9.8* 9.6* 9.8*  HCT 29.2* 28.8* 29.5*  MCV 88.5 88.9 89.1  PLT 297 324 297   Medications:   (feeding supplement) PROSource Plus  30 mL Oral BID BM   aspirin EC  81 mg Oral Daily   B-complex with vitamin C  1 tablet Oral Daily   carvedilol  3.125 mg Oral BID WC   Chlorhexidine Gluconate Cloth  6 each Topical BID   Chlorhexidine Gluconate Cloth  6 each Topical Q0600   cholecalciferol  2,000 Units Oral Daily   clopidogrel  75 mg Oral Daily   darbepoetin (ARANESP) injection - DIALYSIS  100 mcg Intravenous Q Fri-HD   diclofenac Sodium  2 g Topical QID   heparin  5,000 Units Subcutaneous Q8H   insulin aspart  0-6 Units Subcutaneous TID WC   insulin glargine  13 Units Subcutaneous Daily   levETIRAcetam  500 mg Oral QHS   melatonin  3 mg Oral QHS   methylphenidate  5 mg Oral BID WC   mometasone-formoterol  2  puff Inhalation BID   pantoprazole  40 mg Oral Daily   polyethylene glycol  17 g Oral Daily   rosuvastatin  20 mg Oral Daily   senna-docusate  2 tablet Oral BID   sevelamer carbonate  800 mg Oral TID WC   traZODone  100 mg Oral QHS    Dialysis Orders: MWF @ [facility] 4 hrs 180NRe 400/Autoflow 1.5 86.5 kg 2.0K/2.0 Ca TDC, heparin 2000 units bolus - Mircera 100 mcg IV q 2 weeks (last dose 04/14/2021 - Hectorol 5 mcg IV TIW   Assessment/Plan: 1. Acute R subcortical CVA: Back in CIR as of 7/15, transiently back to [facility] 7/14-7/15 with recurrent stroke like symptoms, likely watershed v. hypotensive issue per neuro. 2. Acute LGIB: Hx hemorrhoidal bleeding in past - s/p 2U PRBCs 6/26. S/p EGD/flex sig without clear bleeding source. Plavix and ASA held, now resumed. 3. ESRD: Back to usual MWF schedule - next HD Mon 8/1. 4. Dialysis Access: S/p 1st stage BVT 7/8 by Dr. Scot Dock - mild steal symptoms, tentative plan for ligation per [facility] notes. 5. HTN/volume: Variable BP - trying to avoid intradialytic hypotension, she is well  below OP dry weight.  UF as tolerated.  6. Anemia: Hgb 9.8 - continue Aranesp 130mcg q Friday. 7. Secondary hyperparathyroidism: Hectoral on hold for low PTH x 2. Binder changed from Renvela pills to powder d/t issues swallowing pills. Ca/Phos ok for now. 8. Nutrition: Alb low, continue supplements. 9. T2DM: Per primary. 10. Insomnia: On ritalin during day and trazodone/melatonin to get sleep/wake cycle back on track, seems to be working. Watch BP with stimulants.  ADDENDUM (1:41p): Phos 2 - d/c Renvela. Martin, PA-C 05/22/2021, 11:54 AM  [employer]

## 2021-05-22 NOTE — Progress Notes (Signed)
Occupational Therapy Session Note  Patient Details  Name: Carolyn Russo MRN: [id number] Date of Birth: 1950/12/30  Today's Date: 05/22/2021 OT Individual Time: 0705-0804 OT Individual Time Calculation (min): 59 min    Short Term Goals: Week 3:  OT Short Term Goal 1 (Week 3): STG= LTG  Skilled Therapeutic Interventions/Progress Updates:    Pt received semi-reclined in bed, no c/o pain, agreeable to therapy at earlier than scheduled time. Session focus on self-care retraining, activity tolerance, B FMC, L NMR, func transfers in prep for improved ADL/IADL/func mobility performance + decreased caregiver burden. Req to eat breakfast, but noted that incorrect food order brought to patient. Provided cereal from unit. Came to sitting EOB with min A to progress BLE off bed and to lift trunk, mod Vcs for sequencing log-roll technique. Req assist to open breakfast items and hand-over-hand to open cereal. Self-fed breakfast with use of RUE + built-up grip on spoon with ~15% spillage, noted to drop spoon on floor 1x. Drank beverages with assistance to place straws and cups positioned on edge of tray.  Stand-pivot > w/c with no AD and min A for balance and mod Vcs for sequencing. Completed oral care with mod A for thoroughness and set-up A for materials, req min A to support LUE to hold rinse cup. Noted difficulty controlling toothbrush with RUE despite built up handle.   Pt left seated in w/c with RN present, safety belt alarm engaged, call bell in reach, and all immediate needs met.    Therapy Documentation Precautions:  Precautions Precautions: Fall, Other (comment) Precaution Comments: L inattention, L hemiparesis, seizure protocol, impaired R UE coordination, R port a cath Restrictions Weight Bearing Restrictions: No  Pain: no c/o throughout session   ADL: See Care Tool for more details.   Therapy/Group: Individual Therapy  Volanda Napoleon MS, OTR/L  05/22/2021, 6:52 AM

## 2021-05-23 LAB — GLUCOSE, CAPILLARY
Glucose-Capillary: 124 mg/dL — ABNORMAL HIGH (ref 70–99)
Glucose-Capillary: 162 mg/dL — ABNORMAL HIGH (ref 70–99)
Glucose-Capillary: 216 mg/dL — ABNORMAL HIGH (ref 70–99)
Glucose-Capillary: 154 mg/dL — ABNORMAL HIGH (ref 70–99)

## 2021-05-23 NOTE — Progress Notes (Signed)
Patient states he is having back pain, but is refusing pain medicine because he doesn't want anything to make him drowsy.  He states "if I go to sleep you will put me back in the bed and zip me up."

## 2021-05-23 NOTE — Progress Notes (Signed)
PROGRESS NOTE     Subjective/Complaints: No complaints this morning Appears cold with blanket wrapped around her as she is sitting in chair Appreciate nephro note today  ROS:  Pt denies SOB, abd pain, CP, N/V/C/D, and vision changes, constipation  Objective:       Physical Exam: Vital Signs    Vitals with BMI 05/23/2021 05/22/2021 05/22/2021  Height - - -  Weight - - -  BMI - - -  Systolic 482 500 370  Diastolic 69 59 58  Pulse 71 75 70  Gen: no distress, normal appearing HEENT: oral mucosa pink and moist, NCAT Cardio: Reg rate Chest: normal effort, normal rate of breathing Abd: soft, non-distended Ext: no edema Psych: pleasant, normal affect  Musculoskeletal:      no Pain/TTP with ROM of R wrist this AM or R hand/fingers- R hand very slightly cooler than L hand; trace swelling on dorsum of R wrist which is new. No erythema.  R wrist/hand looks stable today, however also rechecked strength- R grip 2+/5 in R hand and Finger abd 2/5 at best Skin:    General: Skin is warm and dry. Neurological:    Comments: alert, follows commands.   Fair insight and awareness. Provided biographical information. Normal language, speech slightly slurred. Mild left central 7. LUE 2 to 2+/5 deltoid, 3/5 biceps, triceps and 3+/5 wrist and HI. LLE 3-4/5 prox to distal. RUE 4/5 and RLE 5/5 prox to distal.  Tone is normal in the right upper limb no sensory deficits noted--motor/sensory exam stable today.  also cannot do finger touching to each finger/thumb- not able to do any dexterity movements, or hold pen.   NO change on dexterity this AM   Assessment/Plan: 1. Functional deficits which require 3+ hours per day of interdisciplinary therapy in a comprehensive inpatient rehab setting. Physiatrist is providing close team supervision and 24 hour management of active medical problems listed below. Physiatrist and rehab team continue to assess  barriers to discharge/monitor patient progress toward functional and medical goals   Care Tool:   Bathing   Body parts bathed by patient: Right arm, Left arm, Chest, Abdomen, Front perineal area, Right upper leg, Left upper leg    Body parts bathed by helper: Right lower leg, Left lower leg, Buttocks    Bathing assist Assist Level: Minimal Assistance - Patient > 75%    Upper Body Dressing/Undressing Upper body dressing What is the patient wearing?: Pull over shirt   Upper body assist Assist Level: Minimal Assistance - Patient > 75%   Lower Body Dressing/Undressing Lower body dressing       What is the patient wearing?: Pants, Incontinence brief        Lower body assist Assist for lower body dressing: Minimal Assistance - Patient > 75%     Toileting Toileting   Toileting assist Assist for toileting: Moderate Assistance - Patient 50 - 74%    Transfers Chair/bed transfer   Transfers assist     Chair/bed transfer assist level: Minimal Assistance - Patient > 75%    Locomotion Ambulation     Ambulation assist       Assist level: Minimal Assistance - Patient >  75% Assistive device: Walker-rolling Max distance: 50'    Walk 10 feet activity     Assist     Assist level: Minimal Assistance - Patient > 75% Assistive device: Walker-rolling    Walk 50 feet activity     Assist Walk 50 feet with 2 turns activity did not occur: Safety/medical concerns   Assist level: Minimal Assistance - Patient > 75% Assistive device: Walker-rolling      Walk 150 feet activity     Assist Walk 150 feet activity did not occur: Safety/medical concerns        Walk 10 feet on uneven surface activity     Assist Walk 10 feet on uneven surfaces activity did not occur: Safety/medical concerns         Wheelchair         Assist Will patient use wheelchair at discharge?:  (TBD) Type of Wheelchair: Manual   Wheelchair assist level: Dependent - Patient 0% Max wheelchair  distance: 150'      Wheelchair 50 feet with 2 turns activity       Assist           Assist Level: Dependent - Patient 0%    Wheelchair 150 feet activity       Assist       Assist Level: Dependent - Patient 0%     Medical Problem List and Plan: 1.  Left side weakness secondary to nonhemorrhagic right basal ganglia infarction             -patient may shower Reviewed noted 2 day interrupted stay 7/13--7/15 for altered MS , no new CVA identified             con't OT, OT and SLP- will add Ritalin for lethargy and initiation- however due to BP being on high side, will do 2.5 mg with breakfast and lunch.  Continue CIR- PT, OT and SLP  2.  Impaired mobility: -DVT/anticoagulation:  continue SCD             -antiplatelet therapy:ASA             Plavix restarted. 3. Right hip bursitis: continue Tramadol 50 mg every 12 hours as needed, ice, ROM, lidocaine patch  7/27- R wrist pain better- con't regimen  7/28- having leg cramping- if voltaren doesn't help (gel) will try Skelaxin since less sedating muscle relaxant. Nursing ot let me know.   7/30: continue voltaren gel 4. Anxiety/stroke poor initiation: continue Xanax 0.5 mg 3 times daily as needed.  Provide emotional support             7/12- per husband, concerned about flat affect- will d/w possible depression with pt- if so, will start SSRI.             7/20- BP looks OK- will increase to 5 mg BID  7/22- more awake, alert this AM than usual- more initiation- con't regimen- BP controlled for her. 7/26- more awake today (better than yesterday) slept better- con't regimen  7/29- so much more initiation in conveying info this AM-con't regimen             -antipsychotic agents: N/A 5. Neuropsych: This patient is ?capable of making decisions on her own behalf. 6. Skin/Wound Care: Routine skin checks 7. Fluids/Electrolytes/Nutrition: Routine in and outs with follow-up chemistries 8.  Acute lower GI bleed with blood loss anemia.   Follow-up GI services.  Resume ASA as above. - Continue Protonix 40 mg twice daily 7/6: hgb  9.6--monitor for gross bleeding 7/11- last Hb stable at 9.8- will recheck this week             7/12- Hb came back at 9.7- stable- con't regimen             7/13- Hb up to 11.1- will con't to monitor  7/17- Hb 10.6- con't regimen  7/22- Hb down slightly to 9.3- con't Aranesp.  On Aranesp q Friday for anemia or chronic disease due to ESRD  7/27- will see if can get labs in HD? Since she's a very hard stick otherwise.  9.  Chronic diastolic congestive heart failure.  Monitor for any signs of fluid overload.  Continue Lasix 40-80 mg Monday Wednesday Friday             -daily weights, have been stable             7/12- weight bounced up 1 kg today, however was this level 2-3 days ago, so think yesterday's weight might have been slightly off- will monitor  7/22- weight was up yesterday to 86 kg, but down to 80.8 kg today- back to baseline- con't regimen 10.  Newly Dx'd End-stage renal disease.  Hemodialysis as per renal services             7/12- questionable steal syndrome since Fistula placement- will monitor per Vascular- to see if  needs additional intervention  7/19- R hand still cooler to touch- also has decreased dexterity- will likely need EMG after d/c.   7/22- had HD yesterday, not Wednesday when was due- per Renal, will get back on track.   7/28- didn't get HD yesterday- to get today.  11.  Hypertension.  Increase Coreg to 12.5 mg twice daily. Aggressive filtration with ultradialysis tomorrow             7/11- HD today- BP 170s/90s this AM- per renal.             7/12- BP dropped to 70s/50s with HD yesterday- back up to 150s/80s today- con't reigmen  7/17- BP 150s/160s- per renal- will only start 2.5 mg of Ritalin due to BP issues- will monitor closely. - now on 5 mg BID Vitals:   05/23/21 0452 05/23/21 0724  BP: (!) 152/69   Pulse: 71   Resp: 17   Temp: 97.7 F (36.5 C)   SpO2: 98% 95%     7/31 SBP elevated- flowsheet reviewed and diastolic low at times- continue current regimen.  12.  Asthma.  Continue inhalers as directed             - was getting Home O2 prior to admission- will likely need to go home on 2L O2-   Was trying to get at home, but hadn't yet.             7/1- con't nebs prn, home O2 ?PRN only             7/3 lungs very clear today, sats 99% on RA. Some of SOB may be behaviorally related. Reassured pt today that lungs sounded fine.                         -encouraged FV/IS, OOB             7/7: ordered CXT-stable, ordered incentive spirometer 13.Hyperlipidemia.Continue Crestor 14.Obesity.BMI 34.31.Dietery follow up 15.  Diabetes mellitus.hemoglobin AIc 5.2.  CBGs 211-310. Increase Lantus to 13U BID.  7/17- BG's don't  look as good- >200- will follow trend before making change yet.   7/20- increased Lantus to 8 units daily- will monitor- increased from 5 units.   7/22- Bgs 139 to 251 in last 24 hours- increase Lantus to 10 units daily  7/25- BG's 163-273- will increase lantus from 10 units to 13 units- was on 13 units BID_ for some reason it was reduced??? Will see if needs BID if this dose doesn't work well.   7/26- BG's -hasn't had a chance to get better- just got AM dose today- first dose of increased dose. Will monitor  7/27- BG's 103-202- much improved- had been on Lantus 13 units BID when got here- so I'm not sure what changed, when I was on vacation?not clear- but don't feel comfortable increasing since lows low 100s-   -7/29- BG's controlled- overall- con't regimen CBG (last 3)  Recent Labs    05/23/21 0605 05/23/21 1133 05/23/21 1638  GLUCAP 124* 162* 216*    16. Leukocytosis: trending down, repeat Monday             7/11- last WBC was 13k- will recheck in HD today.              7/13- WBC down to 10.1- will monitor  7/16- Last WBC 12.1- was 12.8 last week- bounced down and went back up again?- con't to monitor, labs in HD  7/22- WBC stable at  12.1- don't see a cause?   7/25- WBC reduced to 10.6- con't to monitor 17. Constipation: KUB shows moderate stool burden. Increase senna-docusate to BID.              7/13- LBM yesterday- con't regimen  7/19- LBM 2-3 days ago- will give a dose of Sorbitol at lunch time. 7/20- will give another dose of Sorbitol since cannot give Mg- early this AM- so might go before HD- KUB shows moderate volume of stool throughout colon- could be cause of LUQ pain   7/28- LBM yesterday- con't regimen  18. Fistula placement: successful, new fistula accessed without complications.  7/20- possible steal syndrome of RUE- cooler hand on R- might need EMG after d/c.   7/29- Dr Scot Dock note suggested ligating fistula- will likely need another? However this one is causing problems with strength/sensation/dexterity- plan for possibly next week.  19. Left shoulder pain: ordered lidocaine patch and voltaren gel. 20. Lethargy             7/12- is much improved today- will con't to monitor- CT and CXR of chest were stable             7/16- will readd Melatonin for sleep- never had the chance to use it.   7/17- didn't sleep at all- hopefully Ritalin due day 2.5 mg BID- will help- cannot use Amantadine secondary to HD.   7/22- tolerating Ritalin 5 mg BID= doing better   21. Insomnia/mixing of sleep wake cycle  7/20- will try Trazodone 50 mg QHS and see if can get back on good sleep cycle? Encouraged pt/staff to try and keep her awake more during day.   7/21- didn't sleep more than 1 hour overnight- will increase trazodone to 75 mg QHS. More awake this AM  7/22- slept a few hours last night- slightly better- will increase Trazodone to 100 mg at 8pm nightly to reduce sleepiness in AM.  22.  Right upper extremity discomfort, difficult to get a good history.  Today she is complaining more of wrist pain when using  the walker.  Her exam is unremarkable no history of trauma.  We will trial some Voltaren gel to the right wrist 4 times  per day  7/25- per renal note, still c/o R hand/wrist pain- has been on/off since fistula placed.   7/27- denies pain- con't regimen  7/29- pain better but due to neurological issues from RUE fistula- Dr Scot Dock to look at ligating fistula next week-

## 2021-05-23 NOTE — Progress Notes (Signed)
[facility] Progress Note   Subjective:  Seen in room - OT team assisting with eating breakfast. Denies CP or dyspnea.  Objective Vitals:   05/22/21 1925 05/22/21 1931 05/23/21 0452 05/23/21 0724  BP:  (!) 146/59 (!) 152/69   Pulse:  75 71   Resp:  18 17   Temp:  97.6 F (36.4 C) 97.7 F (36.5 C)   TempSrc:   Oral   SpO2: 95% 96% 98% 95%  Weight:      Height:       Physical Exam General: Well appearing, awake, NAD. Room air. Heart: RRR; no murmur Lungs: CTAB Abdomen: soft Extremities: No LE edema Dialysis Access: TDC, RUE AVF (deep/1st stage BVT)  Additional Objective Labs: Basic Metabolic Panel: Recent Labs  Lab 05/17/21 1444 05/20/21 1130 05/21/21 1408  NA 135 133* 134*  K 4.7 4.6 3.4*  CL 98 99 98  CO2 29 26 30   GLUCOSE 244* 212* 147*  BUN 28* 42* 12  CREATININE 5.52* 5.96* 2.55*  CALCIUM 9.4 9.1 7.8*  PHOS 3.7 4.4 2.0*   Liver Function Tests: Recent Labs  Lab 05/17/21 1444 05/20/21 1130 05/21/21 1408  ALBUMIN 3.1* 3.2* 3.2*   CBC: Recent Labs  Lab 05/17/21 1444 05/20/21 1130 05/21/21 1408  WBC 10.6* 10.4 8.5  HGB 9.8* 9.6* 9.8*  HCT 29.2* 28.8* 29.5*  MCV 88.5 88.9 89.1  PLT 297 324 297   Medications:   (feeding supplement) PROSource Plus  30 mL Oral BID BM   aspirin EC  81 mg Oral Daily   B-complex with vitamin C  1 tablet Oral Daily   carvedilol  3.125 mg Oral BID WC   Chlorhexidine Gluconate Cloth  6 each Topical BID   Chlorhexidine Gluconate Cloth  6 each Topical Q0600   cholecalciferol  2,000 Units Oral Daily   clopidogrel  75 mg Oral Daily   darbepoetin (ARANESP) injection - DIALYSIS  100 mcg Intravenous Q Fri-HD   diclofenac Sodium  2 g Topical QID   heparin  5,000 Units Subcutaneous Q8H   insulin aspart  0-6 Units Subcutaneous TID WC   insulin glargine  13 Units Subcutaneous Daily   levETIRAcetam  500 mg Oral QHS   melatonin  3 mg Oral QHS   methylphenidate  5 mg Oral BID WC   mometasone-formoterol  2 puff  Inhalation BID   pantoprazole  40 mg Oral Daily   polyethylene glycol  17 g Oral Daily   rosuvastatin  20 mg Oral Daily   senna-docusate  2 tablet Oral BID   traZODone  100 mg Oral QHS    Dialysis Orders: MWF @ [facility] 4 hrs 180NRe 400/Autoflow 1.5 86.5 kg 2.0K/2.0 Ca TDC, heparin 2000 units bolus - Mircera 100 mcg IV q 2 weeks (last dose 04/14/2021 - Hectorol 5 mcg IV TIW   Assessment/Plan: 1. Acute R subcortical CVA: Back in CIR as of 7/15, transiently back to [facility] 7/14-7/15 with recurrent stroke like symptoms, likely watershed v. hypotensive issue per neuro. 2. Acute LGIB: Hx hemorrhoidal bleeding in past - s/p 2U PRBCs 6/26. S/p EGD/flex sig without clear bleeding source. Plavix and ASA held, now resumed. 3. ESRD: Back to usual MWF schedule - next HD Mon 8/1. 4. Dialysis Access: S/p 1st stage BVT 7/8 by Dr. Scot Dock - mild steal symptoms, tentative plan for ligation per [facility] notes. 5. HTN/volume: Variable BP - trying to avoid intradialytic hypotension, she is well below OP dry weight.  UF as tolerated.  6.  Anemia: Hgb 9.8 - continue Aranesp 123mcg q Friday. 7. Secondary hyperparathyroidism: Hectoral on hold for low PTH x 2. Binder changed from Renvela pills to powder d/t issues swallowing pills, last Phos low - Renvela held. 8. Nutrition: Alb low, continue supplements. 9. T2DM: Per primary. 10. Insomnia: On ritalin during day and trazodone/melatonin to get sleep/wake cycle back on track, seems to be working. Watch BP with stimulants.  Carolyn Penton, PA-C 05/23/2021, 9:19 AM  [employer]

## 2021-05-23 NOTE — Progress Notes (Signed)
Physical Therapy Session Note  Patient Details  Name: Carolyn Russo MRN: [id number] Date of Birth: May 13, 1951  Today's Date: 05/23/2021 PT Individual Time: 0830-0925 PT Individual Time Calculation (min): 55 min   Short Term Goals: Week 1:  PT Short Term Goal 1 (Week 1): Pt will perform supine<>sit with min assist PT Short Term Goal 1 - Progress (Week 1): Met PT Short Term Goal 2 (Week 1): Pt will perform sit<>stands using LRAD with CGA PT Short Term Goal 2 - Progress (Week 1): Met PT Short Term Goal 3 (Week 1): Pt will perform bed<>chair transfers using LRAD with CGA PT Short Term Goal 3 - Progress (Week 1): Met PT Short Term Goal 4 (Week 1): Pt will ambulate at least 54f using LRAD with min assist PT Short Term Goal 4 - Progress (Week 1): Met PT Short Term Goal 5 (Week 1): Pt will ascend/descend 4 steps using B HRs with min assist Week 2:  PT Short Term Goal 1 (Week 2): =LTG due to ELOS   Skilled Therapeutic Interventions/Progress Updates:   Pt presents in bed attempting to eat breakfast but too reclined. Assisted pt to EOB with min assist and cues for technique. Pt easily distracted by environment and then phone call. Assisted pt with positioning of phone and hand over hand assist for her to try to navigate phone but requires assistance due to decreased fine motor control and smooth movements. Once EOB, assistance provided for self feeding with adaptive built up utensils and cues for technique. Noted pt to have soiled brief. Transferred to toilet with min assist and verbal cues for hand placement and safe technique. Total assist for hygiene. Mod/max assist overall for clothing management (some increased assist due to time provided) for pants, brief, and t-shirt. Set up at sink for oral care and facial hygiene with some assist needed due to decreased functional grip strength in BUE requiring overall min to mod assist.   Therapy Documentation Precautions:  Precautions Precautions: Fall,  Other (comment) Precaution Comments: L inattention, L hemiparesis, seizure protocol, impaired R UE coordination, R port a cath Restrictions Weight Bearing Restrictions: No  Pain: Pain Assessment Pain Scale: 0-10     Therapy/Group: Individual Therapy  GCanary BrimBIvory Broad PT, DPT, CBIS  05/23/2021, 9:29 AM

## 2021-05-24 DIAGNOSIS — Z992 Dependence on renal dialysis: Secondary | ICD-10-CM | POA: Diagnosis not present

## 2021-05-24 DIAGNOSIS — N186 End stage renal disease: Secondary | ICD-10-CM | POA: Diagnosis not present

## 2021-05-24 DIAGNOSIS — E1122 Type 2 diabetes mellitus with diabetic chronic kidney disease: Secondary | ICD-10-CM | POA: Diagnosis not present

## 2021-05-24 LAB — CBC
HCT: 30.2 % — ABNORMAL LOW (ref 36.0–46.0)
Hemoglobin: 10.6 g/dL — ABNORMAL LOW (ref 12.0–15.0)
MCH: 30.1 pg (ref 26.0–34.0)
MCHC: 35.1 g/dL (ref 30.0–36.0)
MCV: 85.8 fL (ref 80.0–100.0)
Platelets: 325 10*3/uL (ref 150–400)
RBC: 3.52 MIL/uL — ABNORMAL LOW (ref 3.87–5.11)
RDW: 16.1 % — ABNORMAL HIGH (ref 11.5–15.5)
WBC: 11.8 10*3/uL — ABNORMAL HIGH (ref 4.0–10.5)
nRBC: 0 % (ref 0.0–0.2)

## 2021-05-24 LAB — GLUCOSE, CAPILLARY
Glucose-Capillary: 108 mg/dL — ABNORMAL HIGH (ref 70–99)
Glucose-Capillary: 169 mg/dL — ABNORMAL HIGH (ref 70–99)
Glucose-Capillary: 109 mg/dL — ABNORMAL HIGH (ref 70–99)
Glucose-Capillary: 184 mg/dL — ABNORMAL HIGH (ref 70–99)

## 2021-05-24 LAB — RENAL FUNCTION PANEL
Albumin: 3.4 g/dL — ABNORMAL LOW (ref 3.5–5.0)
Anion gap: 10 (ref 5–15)
BUN: 30 mg/dL — ABNORMAL HIGH (ref 8–23)
CO2: 24 mmol/L (ref 22–32)
Calcium: 9.3 mg/dL (ref 8.9–10.3)
Chloride: 96 mmol/L — ABNORMAL LOW (ref 98–111)
Creatinine, Ser: 6.14 mg/dL — ABNORMAL HIGH (ref 0.44–1.00)
GFR, Estimated: 7 mL/min — ABNORMAL LOW (ref >60)
Glucose, Bld: 170 mg/dL — ABNORMAL HIGH (ref 70–99)
Phosphorus: 4.5 mg/dL (ref 2.5–4.6)
Potassium: 4.6 mmol/L (ref 3.5–5.1)
Sodium: 130 mmol/L — ABNORMAL LOW (ref 135–145)

## 2021-05-24 NOTE — Progress Notes (Addendum)
[facility] Progress Note   Subjective: Seen in room. No C/Os. Eating lunch. HD later today.   Seems overall a bit cranky-  c/o right hand being cold and that she has nausea with every dialysis here causing her to sign off  Objective Vitals:   05/23/21 1958 05/23/21 2110 05/24/21 0627 05/24/21 0730  BP: (!) 168/63  (!) 117/99   Pulse: 74  74   Resp: 18  18   Temp: 97.7 F (36.5 C)  97.9 F (36.6 C)   TempSrc:      SpO2: 97% 96% 99% 96%  Weight:   80.9 kg   Height:       Physical Exam General: Chronically ill appearing female in NAD Heart: S1,S2 RRR No M/R/G Lungs: CTAB Abdomen: NABS Extremities: No LE edema.  Dialysis Access: RIJ TDC R AVF + T/B    Additional Objective Labs: Basic Metabolic Panel: Recent Labs  Lab 05/17/21 1444 05/20/21 1130 05/21/21 1408  NA 135 133* 134*  K 4.7 4.6 3.4*  CL 98 99 98  CO2 29 26 30   GLUCOSE 244* 212* 147*  BUN 28* 42* 12  CREATININE 5.52* 5.96* 2.55*  CALCIUM 9.4 9.1 7.8*  PHOS 3.7 4.4 2.0*   Liver Function Tests: Recent Labs  Lab 05/17/21 1444 05/20/21 1130 05/21/21 1408  ALBUMIN 3.1* 3.2* 3.2*   No results for input(s): LIPASE, AMYLASE in the last 168 hours. CBC: Recent Labs  Lab 05/17/21 1444 05/20/21 1130 05/21/21 1408  WBC 10.6* 10.4 8.5  HGB 9.8* 9.6* 9.8*  HCT 29.2* 28.8* 29.5*  MCV 88.5 88.9 89.1  PLT 297 324 297   Blood Culture No results found for: SDES, SPECREQUEST, CULT, REPTSTATUS  Cardiac Enzymes: No results for input(s): CKTOTAL, CKMB, CKMBINDEX, TROPONINI in the last 168 hours. CBG: Recent Labs  Lab 05/23/21 1133 05/23/21 1638 05/23/21 2121 05/24/21 0627 05/24/21 1130  GLUCAP 162* 216* 154* 108* 169*   Iron Studies: No results for input(s): IRON, TIBC, TRANSFERRIN, FERRITIN in the last 72 hours. @lablastinr3 @ Studies/Results: No results found. Medications:   (feeding supplement) PROSource Plus  30 mL Oral BID BM   aspirin EC  81 mg Oral Daily   B-complex with  vitamin C  1 tablet Oral Daily   carvedilol  3.125 mg Oral BID WC   Chlorhexidine Gluconate Cloth  6 each Topical BID   Chlorhexidine Gluconate Cloth  6 each Topical Q0600   cholecalciferol  2,000 Units Oral Daily   clopidogrel  75 mg Oral Daily   darbepoetin (ARANESP) injection - DIALYSIS  100 mcg Intravenous Q Fri-HD   diclofenac Sodium  2 g Topical QID   heparin  5,000 Units Subcutaneous Q8H   insulin aspart  0-6 Units Subcutaneous TID WC   insulin glargine  13 Units Subcutaneous Daily   levETIRAcetam  500 mg Oral QHS   melatonin  3 mg Oral QHS   methylphenidate  5 mg Oral BID WC   mometasone-formoterol  2 puff Inhalation BID   pantoprazole  40 mg Oral Daily   polyethylene glycol  17 g Oral Daily   rosuvastatin  20 mg Oral Daily   senna-docusate  2 tablet Oral BID   traZODone  100 mg Oral QHS     Dialysis Orders: MWF @ [facility] 4 hrs 180NRe 400/Autoflow 1.5 86.5 kg 2.0K/2.0 Ca TDC, heparin 2000 units bolus - Mircera 100 mcg IV q 2 weeks (last dose 04/14/2021 - Hectorol 5 mcg IV TIW   Assessment/Plan: 1. Acute  R subcortical CVA: Back in CIR as of 7/15, transiently back to [facility] 7/14-7/15 with recurrent stroke like symptoms, likely watershed v. hypotensive issue per neuro. 2. Acute LGIB: Hx hemorrhoidal bleeding in past - s/p 2U PRBCs 6/26. S/p EGD/flex sig without clear bleeding source. Plavix and ASA held, now resumed. 3. ESRD: Back to usual MWF schedule - next HD Mon 8/1. Has been getting nauseated q tx-  possibly BP getting too low-  will need to follow  4. Dialysis Access: S/p 1st stage BVT 7/8 by Dr. Scot Dock - mild steal symptoms, tentative plan for ligation per [facility] notes however patient refused. Per Dr. Nicole Cella note, he will follow as OP.  5. HTN/volume: Variable BP - trying to avoid intradialytic hypotension, she is well below OP dry weight.  UF as tolerated. Lower EDW upon DC.  6. Anemia: Hgb 9.8 - continue Aranesp 122mcg q Friday. 7. Secondary hyperparathyroidism: Hectoral  on hold for low PTH x 2. Binder changed from Renvela pills to powder d/t issues swallowing pills, last Phos low - Renvela held. 8. Nutrition: Alb low, continue supplements. 9. T2DM: Per primary. 10. Insomnia: On ritalin during day and trazodone/melatonin to get sleep/wake cycle back on track, seems to be working. Watch BP with stimulants.  Rita H. Brown NP-C 05/24/2021, 12:22 PM  [facility] [phone]  Patient seen and examined, agree with above note with above modifications. Cranky today-  telling me that she gets nauseated with every HD-  will need to make sure doing what we can to keep BP up-  HD today and appropriate titrations of dialysis related meds  Corliss Parish, MD 05/24/2021

## 2021-05-24 NOTE — Plan of Care (Signed)
Goals downgraded to reflect fluctuating function and pt reporting hired caregiver at home with assist with LB dressing.   Problem: RH Dressing Goal: LTG Patient will perform upper body dressing (OT) Description: LTG Patient will perform upper body dressing with assist, with/without cues (OT). Flowsheets (Taken 05/24/2021 0846) LTG: Pt will perform upper body dressing with assistance level of: (downgraded to reflect fluctuating function- SD) Minimal Assistance - Patient > 75% Goal: LTG Patient will perform lower body dressing w/assist (OT) Description: LTG: Patient will perform lower body dressing with assist, with/without cues in positioning using equipment (OT) Flowsheets (Taken 05/24/2021 0846) LTG: Pt will perform lower body dressing with assistance level of: (downgraded to reflect fluctuating function- SD) Moderate Assistance - Patient 50 - 74%

## 2021-05-24 NOTE — Plan of Care (Signed)
  Problem: RH Attention Goal: LTG Patient will demonstrate this level of attention during functional activites (SLP) Description: LTG:  Patient will will demonstrate this level of attention during functional activites (SLP) Flowsheets (Taken 05/24/2021 1243) LTG: Patient will demonstrate this level of attention during cognitive/linguistic activities with assistance of (SLP): Minimal Assistance - Patient > 75% Number of minutes patient will demonstrate attention during cognitive/linguistic activities: 3-5

## 2021-05-24 NOTE — Progress Notes (Signed)
Occupational Therapy Session Note  Patient Details  Name: Carolyn Russo MRN: [id number] Date of Birth: 1951/07/08  Today's Date: 05/24/2021 OT Individual Time: 0730-0830 OT Individual Time Calculation (min): 60 min    Short Term Goals: Week 3:  OT Short Term Goal 1 (Week 3): STG= LTG   Skilled Therapeutic Interventions/Progress Updates:    Pt received sitting in the w/c reporting 9/10 pain in her R hand (no other indications of pain other than report and pt using functionally throughout session without grimace, etc). Pt agreeable to OT session focused on sink level ADLs. Pt required extra time for initiation and processing. Pt also with learned helplessness, often asking and insisting on help before attempting herself. UB bathing with min A. Barrier cream applied under breasts. UB dressing mod A. LB bathing with min A for posterior thoroughness. Max A (pt 30%) for LB dressing. Pt reporting her husband will assist with these tasks at home and they have a hired caregiver coming- LB dressing goals downgraded to reflect this. Frequent rest breaks required throughout session. At close of session pt remained in her w/c with all needs met, chair alarm belt set.   Therapy Documentation Precautions:  Precautions Precautions: Fall, Other (comment) Precaution Comments: L inattention, L hemiparesis, seizure protocol, impaired R UE coordination, R port a cath Restrictions Weight Bearing Restrictions: No    Therapy/Group: Individual Therapy  Curtis Sites 05/24/2021, 6:12 AM

## 2021-05-24 NOTE — Progress Notes (Signed)
Occupational Therapy Session Note  Patient Details  Name: Carolyn Russo MRN: [id number] Date of Birth: [birth year]  Today's Date: 05/24/2021 OT Individual Time: 1030-1055 OT Individual Time Calculation (min): 25 min    Short Term Goals: Week 3:  OT Short Term Goal 1 (Week 3): STG= LTG  Skilled Therapeutic Interventions/Progress Updates:    Pt resting in w/c upon arrival. OT intervention with focus on BUE coordination for functional tasks, recall of morning activities, discharge planning, and safety awareness in preparation for discharge 8/4. Pt able to recall morning PT session but unable to recall morning OT session with focus on bathing/dressing. Pt reports that RUE coordination is "still not good" but performed nose to finger with WNL. Pt completed simple table tasks without assistance. Pt stated she is "excited" about going home and a paid care giver has been retained. Pt remained seated in w/c with belt alarm activated and all needs within reach.   Therapy Documentation Precautions:  Precautions Precautions: Fall, Other (comment) Precaution Comments: L inattention, L hemiparesis, seizure protocol, impaired R UE coordination, R port a cath Restrictions Weight Bearing Restrictions: No  Pain: Pain Assessment Pain Scale: 0-10 Pain Score: 0-No pain   Therapy/Group: Individual Therapy  Leroy Libman 05/24/2021, 11:55 AM

## 2021-05-24 NOTE — Progress Notes (Signed)
Speech Language Pathology Daily Session Note  Patient Details  Name: Carolyn Russo MRN: [id number] Date of Birth: 04/28/1951  Today's Date: 05/24/2021 SLP Individual Time: 1132-1202 SLP Individual Time Calculation (min): 30 min  Short Term Goals: Week 2: SLP Short Term Goal 1 (Week 2): STG=LTG due to short ELOS (8/4)  Skilled Therapeutic Interventions:Skilled ST services focused on cognitive skills. SLP facilitated sustained attention, basic problem solving and initiation in novel card task WAR. Pt demonstrated sustained attention in 5 minute intervals, increasing of cuing required as the session continues to 2 minute intervals. Pt only require supervision A verbal cues for problem solving, naming highest card, but required mod A verbal cues to initiation functional turn and verbal response. Pt was left in room with call bell within reach and chair alarm set. SLP recommends to continue skilled services     Pain Pain Assessment Pain Scale: 0-10 Pain Score: 0-No pain  Therapy/Group: Individual Therapy  Meena Barrantes  [facility] 05/24/2021, 12:36 PM

## 2021-05-24 NOTE — Progress Notes (Signed)
Physical Therapy Session Note  Patient Details  Name: Carolyn Russo MRN: [id number] Date of Birth: [birth year]  Today's Date: 05/24/2021 PT Individual Time: 0900-1000 PT Individual Time Calculation (min): 60 min   Short Term Goals: Week 2:  PT Short Term Goal 1 (Week 2): =LTG due to ELOS  Skilled Therapeutic Interventions/Progress Updates:    Pt received seated in w/c handed off from NT after toileting. Pt agreeable to PT session. Pt reports her R hand is cold this AM, no complaints of pain. Dependent transport via w/c to/from therapy gym for time and energy conservation. Sit to stand with CGA to RW throughout session, cues for UE management. Ambulation 4 x 31 ft with RW with R hand orthosis and CGA for balance. Placed cones in L visual field with cues for patient to scan environment and retrieve cones during gait. Pt requires max cueing for safe RW management while reaching for cones and exhibits poor ability to multi task safely. Pt also remains self-limiting and requests to sit after ambulating short distances due to fatigue. Simulation of pt's bed setup at home of 29" height. Pt able to back up to 6" step, step-up with RW and mod A, and back up to bed to sit. Pt is also mod A to descend step when getting out of bed. Pt requires max cueing for safe LE management. Pt unable to complete a second trial of backing up to bed with 6" step due to fatigue, requests to return to her room. Pt left seated in w/c in room with needs in reach, quick release belt and chair alarm in place.  Therapy Documentation Precautions:  Precautions Precautions: Fall, Other (comment) Precaution Comments: L inattention, L hemiparesis, seizure protocol, impaired R UE coordination, R port a cath Restrictions Weight Bearing Restrictions: No    Therapy/Group: Individual Therapy   Excell Seltzer, PT, DPT, CSRS  05/24/2021, 12:13 PM

## 2021-05-24 NOTE — Progress Notes (Signed)
PROGRESS NOTE     Subjective/Complaints:  Pt reports R hand is "the same"-  Didn't sleep well last night, but awake and "perky" this AM.   Her buzzer on bed doesn't work- contacted Camera operator to get new bed!  ROS:  Pt denies SOB, abd pain, CP, N/V/C/D, and vision changes   Objective:       Physical Exam: Vital Signs    Vitals with BMI 05/24/2021 05/24/2021 05/23/2021  Height - - -  Weight - 178 lbs 6 oz -  BMI - 79.02 -  Systolic 409 735 329  Diastolic 54 99 63  Pulse 69 74 74     General: awake, alert, appropriate, sitting up at sink doing grooming/bathing; with OT; NAD HENT: conjugate gaze; oropharynx moist CV: regular rate; no JVD Pulmonary: CTA B/L; no W/R/R- good air movement GI: soft, NT, ND, (+)BS Psychiatric: appropriate; stable delayed responses, but made a joke this AM Neurological: alert  Musculoskeletal:      no Pain/TTP with ROM of R wrist this AM or R hand/fingers- R hand very slightly cooler than L hand; trace swelling on dorsum of R wrist which is new. No erythema.  R wrist/hand looks stable today, however also rechecked strength- R grip 2+/5 in R hand and Finger abd 2/5 at best- no change today Skin:    General: Skin is warm and dry. Neurological:    Comments: alert, follows commands.   Fair insight and awareness. Provided biographical information. Normal language, speech slightly slurred. Mild left central 7. LUE 2 to 2+/5 deltoid, 3/5 biceps, triceps and 3+/5 wrist and HI. LLE 3-4/5 prox to distal. RUE 4/5 and RLE 5/5 prox to distal.  Tone is normal in the right upper limb no sensory deficits noted--motor/sensory exam stable today.  also cannot do finger touching to each finger/thumb- not able to do any dexterity movements, or hold pen.   NO change on dexterity this AM   Assessment/Plan: 1. Functional deficits which require 3+ hours per day of interdisciplinary therapy in a comprehensive  inpatient rehab setting. Physiatrist is providing close team supervision and 24 hour management of active medical problems listed below. Physiatrist and rehab team continue to assess barriers to discharge/monitor patient progress toward functional and medical goals   Care Tool:   Bathing   Body parts bathed by patient: Right arm, Left arm, Chest, Abdomen, Front perineal area, Right upper leg, Left upper leg    Body parts bathed by helper: Right lower leg, Left lower leg, Buttocks    Bathing assist Assist Level: Minimal Assistance - Patient > 75%    Upper Body Dressing/Undressing Upper body dressing What is the patient wearing?: Pull over shirt   Upper body assist Assist Level: Minimal Assistance - Patient > 75%   Lower Body Dressing/Undressing Lower body dressing       What is the patient wearing?: Pants, Incontinence brief        Lower body assist Assist for lower body dressing: Minimal Assistance - Patient > 75%     Toileting Toileting   Toileting assist Assist for toileting: Moderate Assistance - Patient 50 - 74%    Transfers Chair/bed transfer  Transfers assist     Chair/bed transfer assist level: Minimal Assistance - Patient > 75%    Locomotion Ambulation     Ambulation assist       Assist level: Minimal Assistance - Patient > 75% Assistive device: Walker-rolling Max distance: 50'    Walk 10 feet activity     Assist     Assist level: Minimal Assistance - Patient > 75% Assistive device: Walker-rolling    Walk 50 feet activity     Assist Walk 50 feet with 2 turns activity did not occur: Safety/medical concerns   Assist level: Minimal Assistance - Patient > 75% Assistive device: Walker-rolling      Walk 150 feet activity     Assist Walk 150 feet activity did not occur: Safety/medical concerns        Walk 10 feet on uneven surface activity     Assist Walk 10 feet on uneven surfaces activity did not occur: Safety/medical concerns          Wheelchair         Assist Will patient use wheelchair at discharge?:  (TBD) Type of Wheelchair: Manual   Wheelchair assist level: Dependent - Patient 0% Max wheelchair distance: 150'      Wheelchair 50 feet with 2 turns activity       Assist           Assist Level: Dependent - Patient 0%    Wheelchair 150 feet activity       Assist       Assist Level: Dependent - Patient 0%     Medical Problem List and Plan: 1.  Left side weakness secondary to nonhemorrhagic right basal ganglia infarction             -patient may shower Reviewed noted 2 day interrupted stay 7/13--7/15 for altered MS , no new CVA identified             con't OT, OT and SLP- will add Ritalin for lethargy and initiation- however due to BP being on high side, will do 2.5 mg with breakfast and lunch.  Continue CIR- PT, OT and SLP- d/c 8/4 2.  Impaired mobility: -DVT/anticoagulation:  continue SCD             -antiplatelet therapy:ASA             Plavix restarted. 3. Right hip bursitis: continue Tramadol 50 mg every 12 hours as needed, ice, ROM, lidocaine patch  7/27- R wrist pain better- con't regimen  7/28- having leg cramping- if voltaren doesn't help (gel) will try Skelaxin since less sedating muscle relaxant. Nursing ot let me know.   8/1- denies pain this AM- con't regimen prn 4. Anxiety/stroke poor initiation: continue Xanax 0.5 mg 3 times daily as needed.  Provide emotional support             7/12- per husband, concerned about flat affect- will d/w possible depression with pt- if so, will start SSRI.             7/20- BP looks OK- will increase to 5 mg BID  7/22- more awake, alert this AM than usual- more initiation- con't regimen- BP controlled for her. 7/26- more awake today (better than yesterday) slept better- con't regimen  7/29- so much more initiation in conveying info this AM-con't regimen             -antipsychotic agents: N/A 5. Neuropsych: This patient is ?capable of making  decisions on her  own behalf. 6. Skin/Wound Care: Routine skin checks 7. Fluids/Electrolytes/Nutrition: Routine in and outs with follow-up chemistries 8.  Acute lower GI bleed with blood loss anemia.  Follow-up GI services.  Resume ASA as above. - Continue Protonix 40 mg twice daily 7/6: hgb 9.6--monitor for gross bleeding 7/11- last Hb stable at 9.8- will recheck this week             7/12- Hb came back at 9.7- stable- con't regimen             7/13- Hb up to 11.1- will con't to monitor  7/17- Hb 10.6- con't regimen  7/22- Hb down slightly to 9.3- con't Aranesp.  On Aranesp q Friday for anemia or chronic disease due to ESRD  7/27- will see if can get labs in HD? Since she's a very hard stick otherwise.  9.  Chronic diastolic congestive heart failure.  Monitor for any signs of fluid overload.  Continue Lasix 40-80 mg Monday Wednesday Friday             -daily weights, have been stable             7/12- weight bounced up 1 kg today, however was this level 2-3 days ago, so think yesterday's weight might have been slightly off- will monitor  7/22- weight was up yesterday to 86 kg, but down to 80.8 kg today- back to baseline- con't regimen 10.  Newly Dx'd End-stage renal disease.  Hemodialysis as per renal services             7/12- questionable steal syndrome since Fistula placement- will monitor per Vascular- to see if  needs additional intervention  7/19- R hand still cooler to touch- also has decreased dexterity- will likely need EMG after d/c.   7/22- had HD yesterday, not Wednesday when was due- per Renal, will get back on track.   7/28- didn't get HD yesterday- to get today.  11.  Hypertension.  Increase Coreg to 12.5 mg twice daily. Aggressive filtration with ultradialysis tomorrow             7/11- HD today- BP 170s/90s this AM- per renal.             7/12- BP dropped to 70s/50s with HD yesterday- back up to 150s/80s today- con't reigmen  7/17- BP 150s/160s- per renal- will only start  2.5 mg of Ritalin due to BP issues- will monitor closely. - now on 5 mg BID Vitals:   05/24/21 0730 05/24/21 1253  BP:  (!) 148/54  Pulse:  69  Resp:  20  Temp:  98.1 F (36.7 C)  SpO2: 96% 100%    7/31 SBP elevated- flowsheet reviewed and diastolic low at times- continue current regimen. 8/1- BP 140s/150s- DBP in 50s/60s- con't regimen per renal  12.  Asthma.  Continue inhalers as directed             - was getting Home O2 prior to admission- will likely need to go home on 2L O2-   Was trying to get at home, but hadn't yet.             7/1- con't nebs prn, home O2 ?PRN only             7/3 lungs very clear today, sats 99% on RA. Some of SOB may be behaviorally related. Reassured pt today that lungs sounded fine.                         -  encouraged FV/IS, OOB             7/7: ordered CXT-stable, ordered incentive spirometer 13.Hyperlipidemia.Continue Crestor 14.Obesity.BMI 34.31.Dietery follow up 15.  Diabetes mellitus.hemoglobin AIc 5.2.  CBGs 211-310. Increase Lantus to 13U BID.  7/17- BG's don't look as good- >200- will follow trend before making change yet.   7/20- increased Lantus to 8 units daily- will monitor- increased from 5 units.   7/22- Bgs 139 to 251 in last 24 hours- increase Lantus to 10 units daily  7/25- BG's 163-273- will increase lantus from 10 units to 13 units- was on 13 units BID_ for some reason it was reduced??? Will see if needs BID if this dose doesn't work well.   7/26- BG's -hasn't had a chance to get better- just got AM dose today- first dose of increased dose. Will monitor  7/27- BG's 103-202- much improved- had been on Lantus 13 units BID when got here- so I'm not sure what changed, when I was on vacation?not clear- but don't feel comfortable increasing since lows low 100s-   -7/29- BG's controlled- overall- con't regimen CBG (last 3)  Recent Labs    05/23/21 2121 05/24/21 0627 05/24/21 1130  GLUCAP 154* 108* 169*    16. Leukocytosis: trending down,  repeat Monday             7/11- last WBC was 13k- will recheck in HD today.              7/13- WBC down to 10.1- will monitor  7/16- Last WBC 12.1- was 12.8 last week- bounced down and went back up again?- con't to monitor, labs in HD  7/22- WBC stable at 12.1- don't see a cause?   7/25- WBC reduced to 10.6- con't to monitor 17. Constipation: KUB shows moderate stool burden. Increase senna-docusate to BID.              7/13- LBM yesterday- con't regimen  7/19- LBM 2-3 days ago- will give a dose of Sorbitol at lunch time. 7/20- will give another dose of Sorbitol since cannot give Mg- early this AM- so might go before HD- KUB shows moderate volume of stool throughout colon- could be cause of LUQ pain   7/28- LBM yesterday- con't regimen  18. Fistula placement: successful, new fistula accessed without complications.  7/20- possible steal syndrome of RUE- cooler hand on R- might need EMG after d/c.   7/29- Dr Scot Dock note suggested ligating fistula- will likely need another? However this one is causing problems with strength/sensation/dexterity- plan for possibly next week.  19. Left shoulder pain: ordered lidocaine patch and voltaren gel. 20. Lethargy             7/12- is much improved today- will con't to monitor- CT and CXR of chest were stable             7/16- will readd Melatonin for sleep- never had the chance to use it.   7/17- didn't sleep at all- hopefully Ritalin due day 2.5 mg BID- will help- cannot use Amantadine secondary to HD.   7/22- tolerating Ritalin 5 mg BID= doing better   21. Insomnia/mixing of sleep wake cycle  7/20- will try Trazodone 50 mg QHS and see if can get back on good sleep cycle? Encouraged pt/staff to try and keep her awake more during day.   7/21- didn't sleep more than 1 hour overnight- will increase trazodone to 75 mg QHS. More awake this AM  7/22- slept  a few hours last night- slightly better- will increase Trazodone to 100 mg at 8pm nightly to reduce  sleepiness in AM.   8/1- much more awake in AM- con't regimen 22.  Right upper extremity discomfort, difficult to get a good history.  Today she is complaining more of wrist pain when using the walker.  Her exam is unremarkable no history of trauma.  We will trial some Voltaren gel to the right wrist 4 times per day  7/25- per renal note, still c/o R hand/wrist pain- has been on/off since fistula placed.   7/27- denies pain- con't regimen  7/29- pain better but due to neurological issues from RUE fistula- Dr Scot Dock to look at ligating fistula next week-   8/1- Per Dr Nicole Cella note, Ilisha and Dr [name] don't want right now and will see him in f/u in 3-4 weeks- I have L/M for husband to verify this.

## 2021-05-24 NOTE — Progress Notes (Signed)
VASCULAR SURGERY:  I again offered her ligation of her right arm fistula.  She feels strongly about not doing this and states that her husband also feels this way.  She tells me that she is being discharged on Thursday.  I will arrange follow-up in the office in 3 to 4 weeks.  Gae Gallop, MD 7:12 AM

## 2021-05-25 ENCOUNTER — Other Ambulatory Visit: Payer: Self-pay

## 2021-05-25 ENCOUNTER — Telehealth: Payer: Self-pay

## 2021-05-25 DIAGNOSIS — G479 Sleep disorder, unspecified: Secondary | ICD-10-CM

## 2021-05-25 DIAGNOSIS — N185 Chronic kidney disease, stage 5: Secondary | ICD-10-CM

## 2021-05-25 LAB — GLUCOSE, CAPILLARY
Glucose-Capillary: 94 mg/dL (ref 70–99)
Glucose-Capillary: 160 mg/dL — ABNORMAL HIGH (ref 70–99)
Glucose-Capillary: 118 mg/dL — ABNORMAL HIGH (ref 70–99)
Glucose-Capillary: 159 mg/dL — ABNORMAL HIGH (ref 70–99)

## 2021-05-25 MED ORDER — CHLORHEXIDINE GLUCONATE CLOTH 2 % EX PADS: 6.0000 | MEDICATED_PAD | Freq: Every day | CUTANEOUS | Status: DC

## 2021-05-25 NOTE — Patient Care Conference (Signed)
Inpatient RehabilitationTeam Conference and Plan of Care Update Date: 05/25/2021   Time: 11:14 AM    Patient Name: Carolyn Russo      Medical Record Number: [id number]  Date of Birth: [birth year] Sex: Female         Room/Bed: 4W03C/4W03C-01 Payor Info: Payor: MEDICARE / Plan: MEDICARE PART A AND B / Product Type: *No Product type* /    Admit Date/Time:  05/07/2021  4:15 PM  Primary Diagnosis:  Basal ganglia infarction [facility])  Hospital Problems: Principal Problem:   Basal ganglia infarction [facility]) Active Problems:   Cerebrovascular accident (CVA) of right basal ganglia [facility])    Expected Discharge Date: Expected Discharge Date: 05/27/21  Team Members Present: Physician leading conference: Dr. Alger Simons Social Worker Present: Loralee Pacas, [place] Nurse Present: Dorthula Nettles, RN PT Present: Excell Seltzer, PT OT Present: Roanna Epley, [place], OT SLP Present: Charolett Bumpers, SLP PPS Coordinator present : Gunnar Fusi, SLP     Current Status/Progress Goal Weekly Team Focus  Bowel/Bladder   incontinent B/B  Pt will gain continence of bowel and bladder      Swallow/Nutrition/ Hydration   dys 3 textures and thin liquids, supervision  Supervision A  education on dys 3 and regular textures   ADL's      bathing and toileting-min A: UB dressing-min A; LB dressing-mod A; functional transfers-supervision/CGA  activity tolerance, functional transfers, family ed   Mobility   CGA transfers and gait with RW up to 100 ft, CGA to min A stairs  Supervision transfers and gait with LRAD; min A stairs  gait, safety awareness, stairs, family edu, d/c planning   Communication   min-Supervision A - impacte dby alertness  Supervision A  education on speech intelligibility strategies   Safety/Cognition/ Behavioral Observations  mod-min A - reduced alertness  Min A, downgraded sustained attention goal to min A and reduced time 3-5 minutes  education, alertness impacting problem  solving, recall and error awareness   Pain   no pain  Pt will remain pain free      Skin   no skin issues  Pt's skin will remain intact        Discharge Planning:  Pt to d/c to home with husband who intends to hire private aide during the day while he works. Husband will transport pt to dialysis [facility].  [facility]- [facility] for HHPT/OT/SLP/aide.   Team Discussion: Medically ready for discharge. Incontinent B/B, no pain reported. Forgetful. No skin issues. Supervision to contact guard overall, self-limiting. Needs encouragement to keep going. Downgrading some goals because she states that someone will help her at home. On a Dys 3 diet, has reduced sustained attention and awareness. Patient on target to meet rehab goals: yes  *See Care Plan and progress notes for long and short-term goals.   Revisions to Treatment Plan:  Medically ready for discharge.  Teaching Needs: Family education complete.  Current Barriers to Discharge: Decreased caregiver support, Medical stability, Home enviroment access/layout, Incontinence, Lack of/limited family support, Weight, Hemodialysis, Medication compliance, Behavior, and Nutritional means  Possible Resolutions to Barriers: Continue current medications, provide emotional support.     Medical Summary Current Status: R BG infarction, anxiety, pain issues at times, blood cts stable, ESRD has fistula  Barriers to Discharge: Medical stability   Possible Resolutions to Barriers/Weekly Focus: daily assessment of labs, pain, pt data, provide positive reinforcement.   Continued Need for Acute Rehabilitation Level of Care: The patient requires daily medical management by a physician with  specialized training in physical medicine and rehabilitation for the following reasons: Direction of a multidisciplinary physical rehabilitation program to maximize functional independence : Yes Medical management of patient stability for increased activity during participation  in an intensive rehabilitation regime.: Yes Analysis of laboratory values and/or radiology reports with any subsequent need for medication adjustment and/or medical intervention. : Yes   I attest that I was present, lead the team conference, and concur with the assessment and plan of the team.   Cristi Loron 05/25/2021, 5:15 PM

## 2021-05-25 NOTE — Progress Notes (Signed)
Patient ID: Carolyn Russo, female   DOB: [birth year], 70 y.o.   MRN: [id number]  SW met with pt in room to provide updates from team conference and d/c date remains 8/4.   SW spoke with [name] to provide updates. Inquired if pt will need a w/c at home. SW informed will speak with therapy team and follow-up. He would like SW to confirm there are no issues with pt returning to home dialysis center as well.   Loralee Pacas, MSW, [place] Office: [phone] Cell: [phone] Fax: [phone]

## 2021-05-25 NOTE — Chronic Care Management (AMB) (Signed)
05-25-2021: Attempted to start a chart prep for patient's appointment but patient is admitted. Cleared template. Informed Orlando Penner CPP.  05-26-2021: Attempted to call patient's husband to reschedule initial appointment with Orlando Penner CPP on 05-27-2021 due to patient being currently admitted in rehab. Husband's voicemail was too full. Canceled patient's appointment.  [place] Pharmacist Assistant [phone]

## 2021-05-25 NOTE — Progress Notes (Signed)
Carolyn Russo Progress Note   Subjective:   HD yest-  removed almost 2 liters - no low BPs recorded-  she said that she had the nausea but it wasn't as bad-  she does have the same issue in the OP setting   Objective Vitals:   05/24/21 2024 05/24/21 2025 05/25/21 0524 05/25/21 0527  BP: (!) 159/70   (!) 153/56  Pulse: 73   71  Resp: 20   20  Temp: 98.5 F (36.9 C)   98.1 F (36.7 C)  TempSrc: Oral     SpO2: 95% 95%  100%  Weight:   78.4 kg   Height:       Physical Exam General: Chronically ill appearing female in NAD Heart: S1,S2 RRR No M/R/G Lungs: CTAB Abdomen: NABS Extremities: No LE edema.  Dialysis Access: RIJ TDC R AVF + T/B    Additional Objective Labs: Basic Metabolic Panel: Recent Labs  Lab 05/20/21 1130 05/21/21 1408 05/24/21 1253  NA 133* 134* 130*  K 4.6 3.4* 4.6  CL 99 98 96*  CO2 26 30 24   GLUCOSE 212* 147* 170*  BUN 42* 12 30*  CREATININE 5.96* 2.55* 6.14*  CALCIUM 9.1 7.8* 9.3  PHOS 4.4 2.0* 4.5   Liver Function Tests: Recent Labs  Lab 05/20/21 1130 05/21/21 1408 05/24/21 1253  ALBUMIN 3.2* 3.2* 3.4*   No results for input(s): LIPASE, AMYLASE in the last 168 hours. CBC: Recent Labs  Lab 05/20/21 1130 05/21/21 1408 05/24/21 1253  WBC 10.4 8.5 11.8*  HGB 9.6* 9.8* 10.6*  HCT 28.8* 29.5* 30.2*  MCV 88.9 89.1 85.8  PLT 324 297 325   Blood Culture No results found for: SDES, SPECREQUEST, CULT, REPTSTATUS  Cardiac Enzymes: No results for input(s): CKTOTAL, CKMB, CKMBINDEX, TROPONINI in the last 168 hours. CBG: Recent Labs  Lab 05/24/21 0627 05/24/21 1130 05/24/21 1752 05/24/21 2123 05/25/21 0605  GLUCAP 108* 169* 109* 184* 94   Iron Studies: No results for input(s): IRON, TIBC, TRANSFERRIN, FERRITIN in the last 72 hours. @lablastinr3 @ Studies/Results: No results found. Medications:   (feeding supplement) PROSource Plus  30 mL Oral BID BM   aspirin EC  81 mg Oral Daily   B-complex with vitamin C  1  tablet Oral Daily   carvedilol  3.125 mg Oral BID WC   Chlorhexidine Gluconate Cloth  6 each Topical BID   Chlorhexidine Gluconate Cloth  6 each Topical Q0600   cholecalciferol  2,000 Units Oral Daily   clopidogrel  75 mg Oral Daily   darbepoetin (ARANESP) injection - DIALYSIS  100 mcg Intravenous Q Fri-HD   diclofenac Sodium  2 g Topical QID   heparin  5,000 Units Subcutaneous Q8H   insulin aspart  0-6 Units Subcutaneous TID WC   insulin glargine  13 Units Subcutaneous Daily   levETIRAcetam  500 mg Oral QHS   melatonin  3 mg Oral QHS   methylphenidate  5 mg Oral BID WC   mometasone-formoterol  2 puff Inhalation BID   pantoprazole  40 mg Oral Daily   polyethylene glycol  17 g Oral Daily   rosuvastatin  20 mg Oral Daily   senna-docusate  2 tablet Oral BID   traZODone  100 mg Oral QHS     Dialysis Orders: MWF @ [facility] 4 hrs 180NRe 400/Autoflow 1.5 86.5 kg 2.0K/2.0 Ca TDC, heparin 2000 units bolus - Mircera 100 mcg IV q 2 weeks (last dose 04/14/2021 - Hectorol 5 mcg IV TIW   Assessment/Plan:  1. Acute R subcortical CVA: Back in CIR as of 7/15 2. Acute LGIB: Hx hemorrhoidal bleeding in past - s/p 2U PRBCs 6/26. S/p EGD/flex sig without clear bleeding source. Plavix and ASA held, now resumed.  Hgb stable to increasing  3. ESRD: Back to usual MWF schedule - next HD Wed 8/3. Has been getting nauseated q tx-  possibly BP getting too low-  will need to be more gentle if we can  4. Dialysis Access: S/p 1st stage BVT 7/8 by Dr. Scot Dock - mild steal symptoms, tentative plan for ligation per [facility] notes however patient refused. Per Dr. Nicole Cella note, he will follow as OP.  5. HTN/volume: Variable BP - trying to avoid intradialytic hypotension, she is well below OP dry weight.  UF as tolerated. Lower EDW upon DC.  6. Anemia: Hgb 9.8--->10.6 - continue Aranesp 167mcg q Friday. 7. Secondary hyperparathyroidism: Hectoral on hold for low PTH x 2. Binder changed from Renvela pills to powder d/t issues  swallowing pills, then Phos low - Renvela held-  most recent phos 4.5. 8. Nutrition: Alb low, continue supplements. 9. T2DM: Per primary. 10. Insomnia: On ritalin during day and trazodone/melatonin to get sleep/wake cycle back on track, seems to be working. Watch BP with stimulants.  11.  Dispo-  plan for discharge from rehab on 8/4   Corliss Parish, MD 05/25/2021

## 2021-05-25 NOTE — Progress Notes (Signed)
Speech Language Pathology Daily Session Note  Patient Details  Name: [name] MRN: [id number] Date of Birth: [birth year]  Today's Date: 05/25/2021 SLP Individual Time: 0830-0858 SLP Individual Time Calculation (min): 28 min  Short Term Goals: Week 2: SLP Short Term Goal 1 (Week 2): STG=LTG due to short ELOS (8/4)  Skilled Therapeutic Interventions:Skilled ST services focused on cognitive skills. Pt expressed chronic upper abdominal pain and pt had notified nurse. SLP communicated with nurse and she supported giving pt pain medication this am, pt did not initially recall receiving medication but was agreeable. SLP facilitated familiar 4 step card sequencing task, pt demonstrated basic verbal problem solving with min A verbal cues but required mod A verbal cues for error awareness. Pt was distracted by pain. Pt was left in room with call bell within reach and bed alarm set. SLP recommends to continue skilled services.     Pain Pain Assessment Pain Scale: 0-10 Pain Score: 0-No pain Pain Location: Abdomen Pain Orientation: Upper Pain Intervention(s): Medication (See eMAR)  Therapy/Group: Individual Therapy  Carolyn Russo  [facility] 05/25/2021, 11:27 AM

## 2021-05-25 NOTE — Progress Notes (Signed)
Occupational Therapy Session Note  Patient Details  Name: KAWEHI HOSTETTER MRN: [id number] Date of Birth: Nov 15, 1950  Today's Date: 05/25/2021 OT Individual Time: [phone] OT Individual Time Calculation (min): 54 min    Short Term Goals: Week 3:  OT Short Term Goal 1 (Week 3): STG= LTG  Skilled Therapeutic Interventions/Progress Updates:    Pt greeted supine in bed reporting having trouble with her "inspiration" pt on RA with SpO2 98% and HR 78 bpm, pt agreeable to BADL session. Pt completed bed mobility with elevated HOB with supervision, CGA for sit<>stand from EOB, MIN A for ambulatory transfer to BR with RW to void bladder. Pt returned to w/c at sink in  similar fashion where pt completed grooming tasks seated at sink with MOD A to s/u toothbrush. MOD A for UB dressing and MAX A LB Dressing from w/c, increased rest breaks needed during dressing d/t fatigue.  Pt left seated in w/c with safety belt activated and all needs within reach.   Therapy Documentation Precautions:  Precautions Precautions: Fall, Other (comment) Precaution Comments: L inattention, L hemiparesis, seizure protocol, impaired R UE coordination, R port a cath Restrictions Weight Bearing Restrictions: No General:   Vital Signs:  Pain: Pt reports pain in upper ABD this AM 7/10 , provided rest breaks and repositioning as pain mgmt strategies.    Therapy/Group: Individual Therapy  Precious Haws 05/25/2021, 12:13 PM

## 2021-05-25 NOTE — Progress Notes (Signed)
PROGRESS NOTE     Subjective/Complaints:  C/o of upper abdominal pain which she has had for some time. Doesn't always sleep well   ROS: Patient denies fever, rash, sore throat, blurred vision, nausea, vomiting, diarrhea, cough, shortness of breath or chest pain, joint or back pain, headache, or mood change.    Objective:       Physical Exam: Vital Signs    Vitals with BMI 05/25/2021 05/24/2021 05/24/2021  Height - - -  Weight 172 lbs 13 oz - -  BMI 28.7 - -  Systolic 681 157 262  Diastolic 56 70 53  Pulse 71 73 71     Constitutional: No distress . Vital signs reviewed. HEENT: EOMI, oral membranes moist Neck: supple Cardiovascular: RRR without murmur. No JVD    Respiratory/Chest: CTA Bilaterally without wheezes or rales. Normal effort    GI/Abdomen: BS +, sl epigastric-tenderness with palpation, non-distended Ext: no clubbing, cyanosis, or edema Psych: a little flat  Musculoskeletal:        R wrist/hand looks stable today, however also rechecked strength- R grip 2+/5 in R hand and Finger abd 2/5 at best- no change today Skin:    General: Skin is warm and dry. Neurological:    Comments: alert, follows commands.   Fair insight and awareness. Provided biographical information. Normal language, speech slightly slurred. Mild left central 7. LUE 2 to 2+/5 deltoid, 3/5 biceps, triceps and 3+/5 wrist and HI. LLE 3-4/5 prox to distal. RUE 4/5 and RLE 5/5 prox to distal.  Tone is normal in the right upper limb no sensory deficits noted--motor/sensory exam stable today.  also cannot do finger touching to each finger/thumb- not able to do any dexterity movements, or hold pen.   stable motor 8/2   Assessment/Plan: 1. Functional deficits which require 3+ hours per day of interdisciplinary therapy in a comprehensive inpatient rehab setting. Physiatrist is providing close team supervision and 24 hour management of active medical  problems listed below. Physiatrist and rehab team continue to assess barriers to discharge/monitor patient progress toward functional and medical goals   Care Tool:   Bathing   Body parts bathed by patient: Right arm, Left arm, Chest, Abdomen, Front perineal area, Right upper leg, Left upper leg    Body parts bathed by helper: Right lower leg, Left lower leg, Buttocks    Bathing assist Assist Level: Minimal Assistance - Patient > 75%    Upper Body Dressing/Undressing Upper body dressing What is the patient wearing?: Pull over shirt   Upper body assist Assist Level: Minimal Assistance - Patient > 75%   Lower Body Dressing/Undressing Lower body dressing       What is the patient wearing?: Pants, Incontinence brief        Lower body assist Assist for lower body dressing: Minimal Assistance - Patient > 75%     Toileting Toileting   Toileting assist Assist for toileting: Moderate Assistance - Patient 50 - 74%    Transfers Chair/bed transfer   Transfers assist     Chair/bed transfer assist level: Minimal Assistance - Patient > 75%    Locomotion Ambulation     Ambulation assist  Assist level: Minimal Assistance - Patient > 75% Assistive device: Walker-rolling Max distance: 50'    Walk 10 feet activity     Assist     Assist level: Minimal Assistance - Patient > 75% Assistive device: Walker-rolling    Walk 50 feet activity     Assist Walk 50 feet with 2 turns activity did not occur: Safety/medical concerns   Assist level: Minimal Assistance - Patient > 75% Assistive device: Walker-rolling      Walk 150 feet activity     Assist Walk 150 feet activity did not occur: Safety/medical concerns        Walk 10 feet on uneven surface activity     Assist Walk 10 feet on uneven surfaces activity did not occur: Safety/medical concerns         Wheelchair         Assist Will patient use wheelchair at discharge?:  (TBD) Type of Wheelchair: Manual    Wheelchair assist level: Dependent - Patient 0% Max wheelchair distance: 150'      Wheelchair 50 feet with 2 turns activity       Assist           Assist Level: Dependent - Patient 0%    Wheelchair 150 feet activity       Assist       Assist Level: Dependent - Patient 0%     Medical Problem List and Plan: 1.  Left side weakness secondary to nonhemorrhagic right basal ganglia infarction             -patient may shower   - con't OT, OT and SLP-   Continue CIR- PT, OT and SLP- d/c 8/4  -team conference today 2.  Impaired mobility: -DVT/anticoagulation:  continue SCD             -antiplatelet therapy:ASA             Plavix restarted. 3. Right hip bursitis: continue Tramadol 50 mg every 12 hours as needed, ice, ROM, lidocaine patch  7/27- R wrist pain better- con't regimen  7/28- having leg cramping- if voltaren doesn't help (gel) will try Skelaxin since less sedating muscle relaxant. Nursing ot let me know.   8/2- no leg pain today 4. Anxiety/stroke poor initiation: continue Xanax 0.5 mg 3 times daily as needed.  Provide emotional support             initiation improved with ritalin             -antipsychotic agents: N/A 5. Neuropsych: This patient is ?capable of making decisions on her own behalf. 6. Skin/Wound Care: Routine skin checks 7. Fluids/Electrolytes/Nutrition: Routine in and outs with follow-up chemistries 8.  Acute lower GI bleed with blood loss anemia.  Follow-up GI services.  Resume ASA as above. -aranesp -labs with HD 9.  Chronic diastolic congestive heart failure.  Monitor for any signs of fluid overload.  Continue Lasix 40-80 mg Monday Wednesday Friday             -daily weights, some derease today after HD              [facility] Weights   05/24/21 1319 05/24/21 1659 05/25/21 0524  Weight: 82.8 kg 82.8 kg 78.4 kg    10.  Newly Dx'd End-stage renal disease.  Hemodialysis as per renal services             7/12- questionable steal syndrome since Fistula  placement- will monitor per Vascular- to see  if  needs additional intervention  7/19- R hand still cooler to touch- also has decreased dexterity- will likely need EMG after d/c.   HD after therapies each day 11.  Hypertension.  Increase Coreg to 12.5 mg twice daily. Aggressive filtration with ultradialysis tomorrow             8/2 borderline control-continue same regimen Vitals:   05/24/21 2025 05/25/21 0527  BP:  (!) 153/56  Pulse:  71  Resp:  20  Temp:  98.1 F (36.7 C)  SpO2: 95% 100%      12.  Asthma.  Continue inhalers as directed             - was getting Home O2 prior to admission- will likely need to go home on 2L O2-   Was trying to get at home, but hadn't yet.             7/1- con't nebs prn, home O2 ?PRN only             7/3 lungs very clear today, sats 99% on RA. Some of SOB may be behaviorally related. Reassured pt today that lungs sounded fine.                         -encouraged FV/IS, OOB             7/7: ordered CXT-stable, ordered incentive spirometer 13.Hyperlipidemia.Continue Crestor 14.Obesity.BMI 34.31.Dietery follow up 15.  Diabetes mellitus.hemoglobin AIc 5.2.    8/2 fair control, no change in regimen Recent Labs    05/24/21 1752 05/24/21 2123 05/25/21 0605  GLUCAP 109* 184* 94    16. Leukocytosis: trending down, repeat Monday             7/11- last WBC was 13k- will recheck in HD today.              7/13- WBC down to 10.1- will monitor  7/16- Last WBC 12.1- was 12.8 last week- bounced down and went back up again?- con't to monitor, labs in HD  7/22- WBC stable at 12.1- don't see a cause?   7/25- WBC reduced to 10.6- con't to monitor 17. Constipation: moving bowels regularly   -belly hurts more when she's constipated  18. Fistula placement: successful, new fistula accessed without complications.  7/20- possible steal syndrome of RUE- cooler hand on R- might need EMG after d/c.   7/29- Dr Scot Dock note suggested ligating fistula- will likely need  another? However this one is causing problems with strength/sensation/dexterity- plan for possibly next week.  19. Left shoulder pain: ordered lidocaine patch and voltaren gel. 20. Lethargy              - tolerating Ritalin 5 mg BID= doing better   21. Insomnia/mixing of sleep wake cycle     7/22- slept a few hours last night- slightly better- will increase Trazodone to 100 mg at 8pm nightly to reduce sleepiness in AM.   8/1- much more awake in AM- con't regimen 22.  Right upper extremity discomfort, difficult to get a good history.  Today she is complaining more of wrist pain when using the walker.  Her exam is unremarkable no history of trauma.  We will trial some Voltaren gel to the right wrist 4 times per day  7/25- per renal note, still c/o R hand/wrist pain- has been on/off since fistula placed.   7/27- denies pain- con't regimen  7/29- pain better but  due to neurological issues from RUE fistula- Dr Scot Dock to look at ligating fistula next week-   8/1- Per Dr Nicole Cella note, Andrian and Dr [name] don't want right now and will see him in f/u in 3-4 weeks- .

## 2021-05-25 NOTE — Progress Notes (Signed)
Physical Therapy Session Note  Patient Details  Name: Carolyn Russo MRN: [id number] Date of Birth: 04/04/1951  Today's Date: 05/25/2021 PT Individual Time: 1130-1200; 1345-1430 PT Individual Time Calculation (min): 30 min and 45 min PT Missed Time: 15 min Missed Time Reason: patient fatigue  Short Term Goals: Week 2:  PT Short Term Goal 1 (Week 2): =LTG due to ELOS  Skilled Therapeutic Interventions/Progress Updates:    Session 1: Pt received seated in w/c in room, agreeable to PT session. Pt reports pain under L breast and in R hand, nursing and medical team aware of continued complaints of pain in these areas. Pt reports being premedicated for pain prior to start of therapy session. Transported to therapy gym via w/c for time and energy conservation. Attempt to have patient stand to RW for gait training, pt reports onset of nausea. Obtained emesis bag for patient, no vomiting noted but pt reports ongoing nausea. Nursing notified and able to provide anti-nausea medication for patient. Provided ice chips and diet ginger ale per patient request. Applied built up handle to spoon for improved grip. Pt left seated in w/c in room with needs in reach, quick release belt and chair alarm in place.  Session 2: Pt received seated in w/c in room, agreeable to PT session. Pt reports ongoing pain under L breast and increased sensitivity to cold in RUE, pain not rated and declines intervention. Sit to stand with CGA to RW throughout session, cues for RUE management and use of R hand splint. Ambulation 2 x 125 ft with RW and close Supervision to CGA, cues to attend to L visual field and encouragement to increase distance. Ascend/descend 3 x 6" stairs with R handrail and min A for balance, cues for sequencing. Pt exhibits B knee buckling when ascending 3rd step, able to recover with min A. Pt also exhibits decreased RUE grasp on stair rail impairing safety and balance with stair management. Pt reports the rail was  "too cold" to grasp onto with RUE. Pt requests to return to room due to fatigue. Pt left seated in w/c in room handed off to NT at end of session. Pt missed 15 min of scheduled therapy session due to fatigue.   Therapy Documentation Precautions:  Precautions Precautions: Fall, Other (comment) Precaution Comments: L inattention, L hemiparesis, seizure protocol, impaired R UE coordination, R port a cath Restrictions Weight Bearing Restrictions: No     Therapy/Group: Individual Therapy   Excell Seltzer, PT, DPT, CSRS  05/25/2021, 5:38 PM

## 2021-05-25 NOTE — Progress Notes (Signed)
Occupational Therapy Session Note  Patient Details  Name: Carolyn Russo MRN: [id number] Date of Birth: May 22, 1951  Today's Date: 05/25/2021 OT Individual Time: 1030-1055 OT Individual Time Calculation (min): 25 min    Short Term Goals: Week 3:  OT Short Term Goal 1 (Week 3): STG= LTG  Skilled Therapeutic Interventions/Progress Updates:    Pt resting in w/c upon arrival with blanket around shoulders. Pt c/o being cold although thermostat at 75. OT intervention with focus on BUE functional tasks and recall of morning events. Pt required mod questioning cues to recall earlier OT session for bathing/dressing. PT did recall earlier SLP session. RUE at diminished functional level but WNL. Pt stated she is excited about returning home and has not concerns. Pt remained seated in w/c with belt alarm activated and all needs within reach.   Therapy Documentation Precautions:  Precautions Precautions: Fall, Other (comment) Precaution Comments: L inattention, L hemiparesis, seizure protocol, impaired R UE coordination, R port a cath Restrictions Weight Bearing Restrictions: No  Pain: Pain Assessment Pain Scale: 0-10 Pain Score: 5  Pain Location: Abdomen Pain Orientation: Upper Pain Intervention(s): emotional support, RN aware   Therapy/Group: Individual Therapy  Leroy Libman 05/25/2021, 10:57 AM

## 2021-05-26 LAB — CBC
HCT: 31.9 % — ABNORMAL LOW (ref 36.0–46.0)
Hemoglobin: 10.8 g/dL — ABNORMAL LOW (ref 12.0–15.0)
MCH: 29.7 pg (ref 26.0–34.0)
MCHC: 33.9 g/dL (ref 30.0–36.0)
MCV: 87.6 fL (ref 80.0–100.0)
Platelets: 348 10*3/uL (ref 150–400)
RBC: 3.64 MIL/uL — ABNORMAL LOW (ref 3.87–5.11)
RDW: 16.6 % — ABNORMAL HIGH (ref 11.5–15.5)
WBC: 10.3 10*3/uL (ref 4.0–10.5)
nRBC: 0 % (ref 0.0–0.2)

## 2021-05-26 LAB — RENAL FUNCTION PANEL
Albumin: 3.4 g/dL — ABNORMAL LOW (ref 3.5–5.0)
Anion gap: 9 (ref 5–15)
BUN: 22 mg/dL (ref 8–23)
CO2: 24 mmol/L (ref 22–32)
Calcium: 9.3 mg/dL (ref 8.9–10.3)
Chloride: 96 mmol/L — ABNORMAL LOW (ref 98–111)
Creatinine, Ser: 5.29 mg/dL — ABNORMAL HIGH (ref 0.44–1.00)
GFR, Estimated: 8 mL/min — ABNORMAL LOW (ref >60)
Glucose, Bld: 186 mg/dL — ABNORMAL HIGH (ref 70–99)
Phosphorus: 4.5 mg/dL (ref 2.5–4.6)
Potassium: 4.8 mmol/L (ref 3.5–5.1)
Sodium: 129 mmol/L — ABNORMAL LOW (ref 135–145)

## 2021-05-26 LAB — GLUCOSE, CAPILLARY
Glucose-Capillary: 108 mg/dL — ABNORMAL HIGH (ref 70–99)
Glucose-Capillary: 191 mg/dL — ABNORMAL HIGH (ref 70–99)
Glucose-Capillary: 119 mg/dL — ABNORMAL HIGH (ref 70–99)
Glucose-Capillary: 147 mg/dL — ABNORMAL HIGH (ref 70–99)

## 2021-05-26 MED ORDER — DICLOFENAC SODIUM 1 % EX GEL
2.0000 g | Freq: Four times a day (QID) | CUTANEOUS | 0 refills | Status: DC
  Filled 2021-05-26: qty 100, 30d supply, fill #0

## 2021-05-26 MED ORDER — ONDANSETRON HCL 4 MG PO TABS
4.0000 mg | ORAL_TABLET | Freq: Four times a day (QID) | ORAL | 0 refills | Status: DC | PRN
  Filled 2021-05-26: qty 18, 5d supply, fill #0

## 2021-05-26 MED ORDER — CLOPIDOGREL BISULFATE 75 MG PO TABS: 75.0000 mg | ORAL_TABLET | Freq: Every day | ORAL | 0 refills | Status: DC

## 2021-05-26 MED ORDER — BD PEN NEEDLE NANO U/F 32G X 4 MM MISC
1.0000 "application " | Freq: Every day | 0 refills | Status: DC
  Filled 2021-05-26: qty 100, 30d supply, fill #0

## 2021-05-26 MED ORDER — MELATONIN 3 MG PO TABS
3.0000 mg | ORAL_TABLET | Freq: Every day | ORAL | 0 refills | Status: DC
  Filled 2021-05-26: qty 30, 30d supply, fill #0

## 2021-05-26 MED ORDER — ASPIRIN 81 MG PO TBEC
81.0000 mg | DELAYED_RELEASE_TABLET | Freq: Every day | ORAL | 0 refills | Status: DC
  Filled 2021-05-26: qty 100, 100d supply, fill #0

## 2021-05-26 MED ORDER — POLYETHYLENE GLYCOL 3350 17 GM/SCOOP PO POWD
17.0000 g | Freq: Every day | ORAL | 0 refills | Status: DC
  Filled 2021-05-26: qty 510, 30d supply, fill #0

## 2021-05-26 MED ORDER — LEVETIRACETAM 500 MG PO TABS
500.0000 mg | ORAL_TABLET | Freq: Every day | ORAL | 0 refills | Status: DC
  Filled 2021-05-26: qty 30, 30d supply, fill #0

## 2021-05-26 MED ORDER — HYDRALAZINE HCL 10 MG PO TABS
10.0000 mg | ORAL_TABLET | Freq: Three times a day (TID) | ORAL | 0 refills | Status: DC | PRN
  Filled 2021-05-26: qty 90, 30d supply, fill #0

## 2021-05-26 MED ORDER — MOMETASONE FURO-FORMOTEROL FUM 200-5 MCG/ACT IN AERO
2.0000 | INHALATION_SPRAY | Freq: Two times a day (BID) | RESPIRATORY_TRACT | 0 refills | Status: DC
  Filled 2021-05-26: qty 13, 30d supply, fill #0

## 2021-05-26 MED ORDER — METHYLPHENIDATE HCL 5 MG PO TABS
5.0000 mg | ORAL_TABLET | Freq: Two times a day (BID) | ORAL | 0 refills | Status: DC
  Filled 2021-05-26: qty 60, 30d supply, fill #0

## 2021-05-26 MED ORDER — TRAZODONE HCL 100 MG PO TABS
100.0000 mg | ORAL_TABLET | Freq: Every day | ORAL | 0 refills | Status: DC
  Filled 2021-05-26: qty 30, 30d supply, fill #0

## 2021-05-26 MED ORDER — PANTOPRAZOLE SODIUM 40 MG PO TBEC
40.0000 mg | DELAYED_RELEASE_TABLET | Freq: Every day | ORAL | 0 refills | Status: DC
  Filled 2021-05-26: qty 30, 30d supply, fill #0

## 2021-05-26 MED ORDER — ROSUVASTATIN CALCIUM 20 MG PO TABS
20.0000 mg | ORAL_TABLET | Freq: Every day | ORAL | 0 refills | Status: DC
  Filled 2021-05-26: qty 30, 30d supply, fill #0

## 2021-05-26 MED ORDER — PROSOURCE PLUS PO LIQD
30.0000 mL | Freq: Two times a day (BID) | ORAL | 0 refills | Status: DC
  Filled 2021-05-26: qty 887, 15d supply, fill #0

## 2021-05-26 MED ORDER — CARVEDILOL 3.125 MG PO TABS
3.1250 mg | ORAL_TABLET | Freq: Two times a day (BID) | ORAL | 0 refills | Status: DC
  Filled 2021-05-26: qty 60, 30d supply, fill #0

## 2021-05-26 MED ORDER — SENNOSIDES-DOCUSATE SODIUM 8.6-50 MG PO TABS
2.0000 | ORAL_TABLET | Freq: Two times a day (BID) | ORAL | 0 refills | Status: DC
  Filled 2021-05-26: qty 120, 30d supply, fill #0

## 2021-05-26 MED ORDER — METHYLPHENIDATE HCL 5 MG PO TABS: 5.0000 mg | ORAL_TABLET | Freq: Two times a day (BID) | ORAL | 0 refills | Status: DC

## 2021-05-26 MED ORDER — INSULIN GLARGINE 100 UNIT/ML SOLOSTAR PEN
13.0000 [IU] | PEN_INJECTOR | Freq: Every day | SUBCUTANEOUS | 0 refills | Status: DC
  Filled 2021-05-26: qty 6, 46d supply, fill #0

## 2021-05-26 MED ORDER — HEPARIN SODIUM (PORCINE) 1000 UNIT/ML DIALYSIS
20.0000 [IU]/kg | INTRAMUSCULAR | Status: DC | PRN
  Filled 2021-05-26: qty 2

## 2021-05-26 NOTE — Progress Notes (Signed)
Physical Therapy Discharge Summary  Patient Details  Name: Carolyn Russo MRN: [id number] Date of Birth: 29-Sep-1951  Today's Date: 05/26/2021  Patient has met 6 of 9 long term goals due to improved activity tolerance, improved balance, increased strength, and ability to compensate for deficits.  Patient to discharge at an ambulatory level Supervision to [place].   Patient's care partner is independent to provide the necessary physical and cognitive assistance at discharge. Pt's husband has completed hands-on training and is safe to assist patient upon d/c home.  Reasons goals not met: Patient did not meet Supervision level for sit to stand as she still requires CGA, did not meet CGA level for stairs as she still requires min A, and did not meet car transfer goal of CGA as she still requires min A. Family is able to provide this level of assist and pt is safe to d/c home.  Recommendation:  Patient will benefit from ongoing skilled PT services in home health setting to continue to advance safe functional mobility, address ongoing impairments in endurance, strength, safety, balance, independence with functional mobility, attention, awareness, and minimize fall risk.  Equipment: RW  Reasons for discharge: treatment goals met and discharge from hospital  Patient/family agrees with progress made and goals achieved: Yes  PT Discharge Precautions/Restrictions Precautions Precautions: Fall Precaution Comments: L hem, impaired RUE, R port Restrictions Weight Bearing Restrictions: No Pain Pain Assessment Pain Scale: 0-10 Pain Score: 0-No pain Vision/Perception  Perception Perception: Impaired Inattention/Neglect: Does not attend to left visual field;Does not attend to left side of body Praxis Praxis: Impaired Praxis Impairment Details: Motor planning  Cognition Overall Cognitive Status: Impaired/Different from baseline Arousal/Alertness: Lethargic Orientation Level: Oriented X4 Attention:  Sustained Focused Attention: Appears intact Sustained Attention: Impaired Sustained Attention Impairment: Verbal basic;Functional basic;Verbal complex Memory: Impaired Memory Impairment: Storage deficit;Retrieval deficit Awareness: Impaired Awareness Impairment: Anticipatory impairment Problem Solving: Impaired Problem Solving Impairment: Verbal complex;Functional complex Reasoning: Appears intact Behaviors: Perseveration;Poor frustration tolerance Safety/Judgment: Impaired Sensation Sensation Light Touch: Impaired by gross assessment Proprioception: Impaired by gross assessment Additional Comments: difficult to assess due to lethargy and cognition Coordination Gross Motor Movements are Fluid and Coordinated: No Fine Motor Movements are Fluid and Coordinated: No Coordination and Movement Description: gross motor movements impaired by L hemiplegia LUE > LLE, decreased posture control,  and significant fatigue Motor  Motor Motor: Hemiplegia;Abnormal tone;Abnormal postural alignment and control Motor - Skilled Clinical Observations: L hemiplegia LUE > LLE but improved from eval, decreased RUE coordination as well  Mobility Bed Mobility Bed Mobility: Rolling Right;Rolling Left;Supine to Sit;Sit to Supine Rolling Right: Supervision/verbal cueing Rolling Left: Supervision/Verbal cueing Supine to Sit: Supervision/Verbal cueing Sit to Supine: Supervision/Verbal cueing Transfers Transfers: Sit to Stand;Stand Pivot Transfers Sit to Stand: Contact Guard/Touching assist Stand to Sit: Supervision/Verbal cueing Stand Pivot Transfers: Supervision/Verbal cueing Stand Pivot Transfer Details: Verbal cues for sequencing;Verbal cues for technique;Verbal cues for safe use of DME/AE;Verbal cues for precautions/safety Transfer (Assistive device): Rolling walker Locomotion  Gait Ambulation: Yes Gait Assistance: Supervision/Verbal cueing Gait Distance (Feet): 125 Feet Assistive device: Rolling  walker Gait Assistance Details: Tactile cues for posture;Tactile cues for placement;Verbal cues for precautions/safety;Verbal cues for safe use of DME/AE Gait Gait: Yes Gait Pattern: Impaired Gait Pattern: Trunk flexed;Lateral trunk lean to left;Decreased step length - left;Decreased step length - right Gait velocity: decreased Stairs / Additional Locomotion Stairs: Yes Stairs Assistance: Minimal Assistance - Patient > 75% Stair Management Technique: One rail Right;Sideways Number of Stairs: 4 Height of Stairs: 6 Wheelchair Mobility  Wheelchair Mobility: No  Trunk/Postural Assessment  Cervical Assessment Cervical Assessment: Exceptions to [facility] (forward head) Thoracic Assessment Thoracic Assessment: Exceptions to [facility] (rounded shoulders) Lumbar Assessment Lumbar Assessment: Exceptions to [facility] (posterior pelvic tilt) Postural Control Postural Control: Deficits on evaluation Trunk Control: impaired, L lateral lean at times in standing Righting Reactions: delayed/impaired  Balance Balance Balance Assessed: Yes Static Sitting Balance Static Sitting - Balance Support: No upper extremity supported;Feet supported Static Sitting - Level of Assistance: 6: Modified independent (Device/Increase time) Dynamic Sitting Balance Dynamic Sitting - Balance Support: No upper extremity supported;Feet supported;During functional activity Dynamic Sitting - Level of Assistance: 5: Stand by assistance Dynamic Sitting - Balance Activities: Lateral lean/weight shifting;Reaching for objects;Forward lean/weight shifting Static Standing Balance Static Standing - Balance Support: Bilateral upper extremity supported;During functional activity Static Standing - Level of Assistance: 5: Stand by assistance Dynamic Standing Balance Dynamic Standing - Balance Support: Bilateral upper extremity supported;During functional activity Dynamic Standing - Level of Assistance: 5: Stand by assistance Extremity Assessment   RUE Assessment RUE Assessment: Exceptions to [facility] Active Range of Motion (AROM) Comments: shoulder ROM WNL, decreased FMC/GMC for R hand and digits LUE Assessment LUE Assessment: Exceptions to [facility] LUE Body System: Neuro Brunstrum levels for arm and hand: Arm;Hand Brunstrum level for arm: Stage IV Movement is deviating from synergy Brunstrum level for hand: Stage V Independence from basic synergies RLE Assessment RLE Assessment: Exceptions to [facility] General Strength Comments: impaired, see below RLE Strength Right Hip Flexion: 4/5 Right Knee Flexion: 4/5 Right Knee Extension: 5/5 Right Ankle Dorsiflexion: 4/5 Right Ankle Plantar Flexion: 4-/5 LLE Assessment LLE Assessment: Exceptions to [facility] General Strength Comments: impaired, see below LLE Strength Left Hip Flexion: 4-/5 Left Knee Flexion: 4/5 Left Knee Extension: 4/5 Left Ankle Dorsiflexion: 4/5 Left Ankle Plantar Flexion: 4-/5     Excell Seltzer, PT, DPT, CSRS 05/26/2021, 5:04 PM

## 2021-05-26 NOTE — Progress Notes (Signed)
Occupational Therapy Session Note  Patient Details  Name: Carolyn Russo MRN: [id number] Date of Birth: August 08, 1951  Today's Date: 05/26/2021 OT Individual Time: 1100-1158 OT Individual Time Calculation (min): 58 min    Short Term Goals: Week 1:  OT Short Term Goal 1 (Week 1): Pt will complete 2/3 steps of toileting with mod A. OT Short Term Goal 1 - Progress (Week 1): Met OT Short Term Goal 2 (Week 1): Pt will complete standing ADL with min A. OT Short Term Goal 2 - Progress (Week 1): Progressing toward goal OT Short Term Goal 3 (Week 1): Pt will don shirt with MIN A OT Short Term Goal 3 - Progress (Week 1): Progressing toward goal OT Short Term Goal 4 (Week 1): Pt will recall hemi dressing strategies with min VC OT Short Term Goal 4 - Progress (Week 1): Met Week 2:  OT Short Term Goal 1 (Week 2): Pt will complete 2/3 toileting tasks with no more than Min A OT Short Term Goal 1 - Progress (Week 2): Met OT Short Term Goal 2 (Week 2): Pt will perform LB dress with Mod A at sit <> stand level OT Short Term Goal 2 - Progress (Week 2): Progressing toward goal OT Short Term Goal 3 (Week 2): Pt will use R hand to self feed with AE PRN with Supervision OT Short Term Goal 3 - Progress (Week 2): Met OT Short Term Goal 4 (Week 2): Pt will consistently perform ADL transfers with CGA and LRAD OT Short Term Goal 4 - Progress (Week 2): Met Week 3:  OT Short Term Goal 1 (Week 3): STG= LTG  Skilled Therapeutic Interventions/Progress Updates:    Pt greeted at time of session up in wheelchair sleepy but agreeable to OT session, no pain reported. Aware of grad day and home tomorrow. Declined B&D. Pt ambulated chair <> bathroom with Supervision and RW with R hand splint. Toileting Mod A overall, self limiting not wanting to attempt hygiene posteriorly, incontinent of BM in brief. Standing at sink with Supervision for hand hygiene. Nephrology PA entered at this time as well for brief check in. Pt transported  room <> gym via w/c total A for time and focus on NMR for RUE (ACE removed and later reapplied for comfort) for [facility] to promote pincher grasp of cards during matching game, Max A for proper form. Husband joined at this time, pt transported back to room and reviewed with pt and husband [facility] activities to do at home. Will drop off HEP prior to pt leaving. Husband with questions regarding w/c vs transport chair and will relay to PT. Pt with alarm on call bell in reach.   Therapy Documentation Precautions:  Precautions Precautions: Fall, Other (comment) Precaution Comments: L inattention, L hemiparesis, seizure protocol, impaired R UE coordination, R port a cath Restrictions Weight Bearing Restrictions: No     Therapy/Group: Individual Therapy  [name] 05/26/2021, 7:22 AM

## 2021-05-26 NOTE — Progress Notes (Addendum)
Patient ID: Carolyn Russo, female   DOB: 06-01-1951, 70 y.o.   MRN: [id number]  SW spoke with Pam/Dialysis Coord Covering for Marya Amsler [phone]) to discuss no issues with pt returning to dialysis center. SW waiting on f/u. *SW received return call stating she is good to return to her dialysis center.   SW spoke with [name] [phone]) to inform on transport chair being suggested. Husband in agreement. SW informed on above.   SW ordered transport chair with [employer] via parachute.   *Update- [employer] does not have transport chair in stock, so item will be delivered to the home. SW made efforts to inform pt husband but vm is full. SW sent text to pt husband to inform on changes.   Loralee Pacas, MSW, [place] Office: [phone] Cell: [phone] Fax: [phone]

## 2021-05-26 NOTE — Progress Notes (Signed)
Physical Therapy Session Note  Patient Details  Name: Carolyn Russo MRN: [id number] Date of Birth: 06/18/1951  Today's Date: 05/26/2021 PT Individual Time: 0800-0915 PT Individual Time Calculation (min): 75 min   Short Term Goals: Week 2:  PT Short Term Goal 1 (Week 2): =LTG due to ELOS  Skilled Therapeutic Interventions/Progress Updates:    Pt received supine in bed asleep, arousable and agreeable to PT session. No complaints of pain this AM, frequent complaints of RUE feeling "cold" and "tingly". Education with patient regarding nerve pain. Pt perseverative on RUE feeling cold and requesting it be covered with a blanket, education that limb needs to be accessible during functional tasks such as gait and stair navigation. Bed mobility Supervision. Sit to stand with CGA to RW throughout session. Stand pivot transfer to w/c with RW and Supervision. Pt is setup A for washing her face at the sink. Pt taken dependently to therapy gym via w/c for time and energy conservation. Car transfer with min A needed for LLE management, use of RW, cues for safety. Pt remains self-limiting and needs encouragement to perform as much of task herself as she is able. Ascend/descend 4 x 6" stairs with R handrail laterally with min A for safety, cues for sequencing. Ambulation 2 x 25 ft with RW and CGA with cues to increase distance, pt states, "I'm going to collapse" and requests to sit down despite exhibiting no physical signs of fatigue. Pt left seated in w/c in room with needs in reach, quick release belt and chair alarm in place.  Therapy Documentation Precautions:  Precautions Precautions: Fall, Other (comment) Precaution Comments: L inattention, L hemiparesis, seizure protocol, impaired R UE coordination, R port a cath Restrictions Weight Bearing Restrictions: No    Therapy/Group: Individual Therapy   Excell Seltzer, PT, DPT, CSRS  05/26/2021, 12:06 PM

## 2021-05-26 NOTE — Discharge Summary (Signed)
Physician Discharge Summary  Patient ID: Carolyn Russo MRN: [id number] DOB/AGE: 1950/12/09 70 y.o.  Admit date: 05/07/2021 Discharge date: 05/27/2021  Discharge Diagnoses:  Principal Problem:   Basal ganglia infarction [facility]) Active Problems:   Cerebrovascular accident (CVA) of right basal ganglia (HCC) Pain management Anxiety Acute lower GI bleed with blood loss anemia Chronic diastolic congestive heart failure Recently diagnosed end-stage renal disease with hemodialysis Hypertension Asthma Hyperlipidemia Obesity Diabetes mellitus  Discharged Condition: Stable  Significant Diagnostic Studies: CT ANGIO HEAD W OR WO CONTRAST  Result Date: 05/05/2021 CLINICAL DATA:  Initial evaluation for acute stroke, TIA. EXAM: CT ANGIOGRAPHY HEAD AND NECK CT PERFUSION BRAIN TECHNIQUE: Multidetector CT imaging of the head and neck was performed using the standard protocol during bolus administration of intravenous contrast. Multiplanar CT image reconstructions and MIPs were obtained to evaluate the vascular anatomy. Carotid stenosis measurements (when applicable) are obtained utilizing NASCET criteria, using the distal internal carotid diameter as the denominator. Multiphase CT imaging of the brain was performed following IV bolus contrast injection. Subsequent parametric perfusion maps were calculated using RAPID software. CONTRAST:  136mL OMNIPAQUE IOHEXOL 350 MG/ML SOLN COMPARISON:  Prior head CT from earlier the same day as well as previous MRA from 04/17/2021. FINDINGS: CTA NECK FINDINGS Aortic arch: Visualized aortic arch normal in caliber with normal 3 vessel morphology. No hemodynamically significant stenosis seen about the origin of the great vessels. Right carotid system: Right common and internal carotid arteries patent without stenosis, dissection or occlusion. Minimal atheromatous plaque about the proximal cervical right ICA without stenosis. Left carotid system: Left common and internal carotid  arteries patent without stenosis, dissection or occlusion. Vertebral arteries: Both vertebral arteries arise from subclavian arteries. No proximal subclavian artery stenosis. Vertebral arteries diminutive but widely patent within the neck without stenosis, dissection or occlusion. Skeleton: No visible acute osseous finding. No discrete or worrisome osseous lesions. Other neck: No other acute soft tissue abnormality within the neck. 7 mm right thyroid nodule noted, of doubtful significance given size and patient age, no follow-up imaging recommended (ref: J Am Coll Radiol. 2015 Feb;12(2): 143-50). Right-sided hemodialysis catheter in place. Upper chest: Visualized upper chest demonstrates no acute finding. Review of the MIP images confirms the above findings CTA HEAD FINDINGS Anterior circulation: Petrous segments patent bilaterally. Mild atheromatous change within the carotid siphons without hemodynamically significant stenosis. No made of a subtle 2 mm focal outpouching arising from the proximal cavernous right ICA, which could reflect a small aneurysm versus vascular infundibulum (series 7, image 254). A1 segments patent bilaterally. Normal anterior communicating artery complex. Anterior cerebral arteries patent to their distal aspects without stenosis. No M1 stenosis or occlusion. Normal MCA bifurcations. Distal MCA branches well perfused and symmetric. Posterior circulation: Both V4 segments patent to the vertebrobasilar junction without stenosis. Both PICA origins patent and normal. Basilar patent to its distal aspect without stenosis. Superior cerebellar arteries patent bilaterally. Both PCA supplied via the basilar as well as prominent bilateral posterior communicating arteries. Atheromatous irregularity within the bilateral P2 segments with associated mild to moderate multifocal narrowing. PCAs remain patent to their distal aspects. Venous sinuses: Grossly patent allowing for timing the contrast bolus.  Anatomic variants: None significant. Review of the MIP images confirms the above findings CT Brain Perfusion Findings: ASPECTS: Not calculated. The CT perfusion portion of this exam unfortunately failed due to technical difficulty. No perfusion maps were generated. IMPRESSION: 1. Negative CTA for emergent large vessel occlusion. 2. Mild atheromatous change about the major arterial vasculature of  the head and neck as above. No proximal high-grade or correctable stenosis. 3. 2 mm focal outpouching arising from the proximal cavernous right ICA, which could reflect a small aneurysm versus vascular infundibulum. Attention at follow-up recommended. 4. CT perfusion portion of this exam unfortunately failed due to technical difficulty. No perfusion maps were generated. Critical Value/emergent results were called by telephone at the time of interpretation on 05/05/2021 at 8:15 pm to provider ERIC [facility], who verbally acknowledged these results. Electronically Signed   By: Jeannine Boga M.D.   On: 05/05/2021 21:04   DG Chest 2 View  Result Date: 05/03/2021 CLINICAL DATA:  70 year old female with shortness of breath. EXAM: CHEST - 2 VIEW COMPARISON:  Chest radiograph dated 04/29/2021. FINDINGS: Dialysis catheter with tip in similar position close to the cavoatrial junction. No focal consolidation, pleural effusion or pneumothorax. Left lung base atelectasis. Stable cardiac silhouette. No acute osseous pathology. Degenerative changes of the spine. IMPRESSION: No active cardiopulmonary disease. Electronically Signed   By: Anner Crete M.D.   On: 05/03/2021 21:23   DG Chest 2 View  Result Date: 04/29/2021 CLINICAL DATA:  Pt experiencing SOB  CVA EXAM: CHEST - 2 VIEW COMPARISON:  04/22/2021 FINDINGS: Relatively low lung volumes. Chronic linear scarring or subsegmental atelectasis in the left infrahilar region and laterally in the left mid lung. Heart size upper limits normal. Tunneled right IJ hemodialysis  catheter extends to the mid right atrium as before. No effusion.  No pneumothorax. Thoracic dextroscoliosis. IMPRESSION: Low lung volumes.  No acute findings. Electronically Signed   By: Lucrezia Europe M.D.   On: 04/29/2021 11:42   DG Abd 1 View  Result Date: 05/12/2021 CLINICAL DATA:  Acute left upper quadrant pain. Left upper quadrant pain when taking deep breath and rolls and bed. EXAM: ABDOMEN - 1 VIEW COMPARISON:  None. FINDINGS: Divided supine views of the abdomen obtained. No evidence of free intra-abdominal air. No bowel dilatation to suggest obstruction. Moderate volume of stool throughout the entire colon. No abnormal rectal distention. No visceral thigh assess or radiopaque calculi, detailed assessment limited by habitus. Scoliosis and degenerative change in the spine. IMPRESSION: Nonobstructive bowel gas pattern with moderate volume of colonic stool. Electronically Signed   By: Keith Rake M.D.   On: 05/12/2021 15:10   CT HEAD WO CONTRAST  Result Date: 05/05/2021 CLINICAL DATA:  70 year old female with neurologic deficit. EXAM: CT HEAD WITHOUT CONTRAST TECHNIQUE: Contiguous axial images were obtained from the base of the skull through the vertex without intravenous contrast. COMPARISON:  Head CT dated 05/03/2021. FINDINGS: Brain: Mild age-related atrophy and moderate chronic microvascular ischemic changes. An area of infarct again noted lateral to the right thalamus, seen on the prior CT. There is no acute intracranial hemorrhage. No mass effect or midline shift. No extra-axial fluid collection. Vascular: No hyperdense vessel or unexpected calcification. Skull: Normal. Negative for fracture or focal lesion. Sinuses/Orbits: The visualized paranasal sinuses and mastoid air cells are clear. Bilateral maxillary sinus surgeries. Other: None IMPRESSION: 1. No acute intracranial pathology. 2. Age-related atrophy and chronic microvascular ischemic changes. Similar appearance of focal area of old infarct  lateral to the right thalamus. Electronically Signed   By: Anner Crete M.D.   On: 05/05/2021 19:56   CT HEAD WO CONTRAST  Result Date: 05/03/2021 CLINICAL DATA:  Mental status changes of unknown cause. EXAM: CT HEAD WITHOUT CONTRAST TECHNIQUE: Contiguous axial images were obtained from the base of the skull through the vertex without intravenous contrast. COMPARISON:  04/18/2021  FINDINGS: Brain: No focal abnormality seen affecting the brainstem or cerebellum. Subacute infarction in the right lateral thalamus/posterior limb internal capsule is more clearly visible. No evidence of extension. Chronic small-vessel ischemic changes elsewhere throughout the cerebral hemispheric white matter as seen previously. No sign of new insult, mass, hemorrhage, hydrocephalus or extra-axial collection. Vascular: There is atherosclerotic calcification of the major vessels at the base of the brain. Skull: Negative Sinuses/Orbits: Clear sinuses. Previous functional endoscopic sinus surgery. Orbits negative. Other: None IMPRESSION: No acute CT finding. Subacute infarction in the right lateral thalamus/radiating white matter tracts is more clearly visible but there is no sign of extension or hemorrhage. Chronic small-vessel ischemic changes elsewhere affecting the cerebral hemispheric white matter. Electronically Signed   By: Nelson Chimes M.D.   On: 05/03/2021 19:06   CT ANGIO NECK W OR WO CONTRAST  Result Date: 05/05/2021 CLINICAL DATA:  Initial evaluation for acute stroke, TIA. EXAM: CT ANGIOGRAPHY HEAD AND NECK CT PERFUSION BRAIN TECHNIQUE: Multidetector CT imaging of the head and neck was performed using the standard protocol during bolus administration of intravenous contrast. Multiplanar CT image reconstructions and MIPs were obtained to evaluate the vascular anatomy. Carotid stenosis measurements (when applicable) are obtained utilizing NASCET criteria, using the distal internal carotid diameter as the denominator.  Multiphase CT imaging of the brain was performed following IV bolus contrast injection. Subsequent parametric perfusion maps were calculated using RAPID software. CONTRAST:  114mL OMNIPAQUE IOHEXOL 350 MG/ML SOLN COMPARISON:  Prior head CT from earlier the same day as well as previous MRA from 04/17/2021. FINDINGS: CTA NECK FINDINGS Aortic arch: Visualized aortic arch normal in caliber with normal 3 vessel morphology. No hemodynamically significant stenosis seen about the origin of the great vessels. Right carotid system: Right common and internal carotid arteries patent without stenosis, dissection or occlusion. Minimal atheromatous plaque about the proximal cervical right ICA without stenosis. Left carotid system: Left common and internal carotid arteries patent without stenosis, dissection or occlusion. Vertebral arteries: Both vertebral arteries arise from subclavian arteries. No proximal subclavian artery stenosis. Vertebral arteries diminutive but widely patent within the neck without stenosis, dissection or occlusion. Skeleton: No visible acute osseous finding. No discrete or worrisome osseous lesions. Other neck: No other acute soft tissue abnormality within the neck. 7 mm right thyroid nodule noted, of doubtful significance given size and patient age, no follow-up imaging recommended (ref: J Am Coll Radiol. 2015 Feb;12(2): 143-50). Right-sided hemodialysis catheter in place. Upper chest: Visualized upper chest demonstrates no acute finding. Review of the MIP images confirms the above findings CTA HEAD FINDINGS Anterior circulation: Petrous segments patent bilaterally. Mild atheromatous change within the carotid siphons without hemodynamically significant stenosis. No made of a subtle 2 mm focal outpouching arising from the proximal cavernous right ICA, which could reflect a small aneurysm versus vascular infundibulum (series 7, image 254). A1 segments patent bilaterally. Normal anterior communicating artery  complex. Anterior cerebral arteries patent to their distal aspects without stenosis. No M1 stenosis or occlusion. Normal MCA bifurcations. Distal MCA branches well perfused and symmetric. Posterior circulation: Both V4 segments patent to the vertebrobasilar junction without stenosis. Both PICA origins patent and normal. Basilar patent to its distal aspect without stenosis. Superior cerebellar arteries patent bilaterally. Both PCA supplied via the basilar as well as prominent bilateral posterior communicating arteries. Atheromatous irregularity within the bilateral P2 segments with associated mild to moderate multifocal narrowing. PCAs remain patent to their distal aspects. Venous sinuses: Grossly patent allowing for timing the contrast bolus. Anatomic variants:  None significant. Review of the MIP images confirms the above findings CT Brain Perfusion Findings: ASPECTS: Not calculated. The CT perfusion portion of this exam unfortunately failed due to technical difficulty. No perfusion maps were generated. IMPRESSION: 1. Negative CTA for emergent large vessel occlusion. 2. Mild atheromatous change about the major arterial vasculature of the head and neck as above. No proximal high-grade or correctable stenosis. 3. 2 mm focal outpouching arising from the proximal cavernous right ICA, which could reflect a small aneurysm versus vascular infundibulum. Attention at follow-up recommended. 4. CT perfusion portion of this exam unfortunately failed due to technical difficulty. No perfusion maps were generated. Critical Value/emergent results were called by telephone at the time of interpretation on 05/05/2021 at 8:15 pm to provider ERIC [facility], who verbally acknowledged these results. Electronically Signed   By: Jeannine Boga M.D.   On: 05/05/2021 21:04   MR BRAIN WO CONTRAST  Result Date: 05/06/2021 CLINICAL DATA:  Left-sided weakness EXAM: MRI HEAD WITHOUT CONTRAST TECHNIQUE: Multiplanar, multiecho pulse sequences  of the brain and surrounding structures were obtained without intravenous contrast. COMPARISON:  04/16/2021 FINDINGS: Significant motion artifact is present. Brain: There is persistent diffusion hyperintensity with decreased ADC hypointensity involving the right corona radiata and basal ganglia. No new abnormal diffusion. There is no acute infarction or intracranial hemorrhage. There is no intracranial mass, mass effect, or edema. There is no hydrocephalus or extra-axial fluid collection. Prominence of the ventricles and sulci reflects stable parenchymal volume loss. Patchy and confluent areas of T2 hyperintensity in the supratentorial and pontine white matter are nonspecific but probably reflects stable chronic microvascular ischemic changes. Very small chronic right cerebellar infarct is noted. Vascular: Major vessel flow voids at the skull base are preserved. Skull and upper cervical spine: Normal marrow signal is grossly preserved. Sinuses/Orbits: Paranasal sinuses are aerated. Bilateral lens replacements. Other: Sella is unremarkable.  Mastoid air cells are clear. IMPRESSION: No acute infarction. Evolving prior infarction of the right corona radiata and basal ganglia. Chronic microvascular ischemic changes. Electronically Signed   By: Macy Mis M.D.   On: 05/06/2021 10:40   CT CEREBRAL PERFUSION W CONTRAST  Result Date: 05/05/2021 CLINICAL DATA:  Initial evaluation for acute stroke, TIA. EXAM: CT ANGIOGRAPHY HEAD AND NECK CT PERFUSION BRAIN TECHNIQUE: Multidetector CT imaging of the head and neck was performed using the standard protocol during bolus administration of intravenous contrast. Multiplanar CT image reconstructions and MIPs were obtained to evaluate the vascular anatomy. Carotid stenosis measurements (when applicable) are obtained utilizing NASCET criteria, using the distal internal carotid diameter as the denominator. Multiphase CT imaging of the brain was performed following IV bolus  contrast injection. Subsequent parametric perfusion maps were calculated using RAPID software. CONTRAST:  124mL OMNIPAQUE IOHEXOL 350 MG/ML SOLN COMPARISON:  Prior head CT from earlier the same day as well as previous MRA from 04/17/2021. FINDINGS: CTA NECK FINDINGS Aortic arch: Visualized aortic arch normal in caliber with normal 3 vessel morphology. No hemodynamically significant stenosis seen about the origin of the great vessels. Right carotid system: Right common and internal carotid arteries patent without stenosis, dissection or occlusion. Minimal atheromatous plaque about the proximal cervical right ICA without stenosis. Left carotid system: Left common and internal carotid arteries patent without stenosis, dissection or occlusion. Vertebral arteries: Both vertebral arteries arise from subclavian arteries. No proximal subclavian artery stenosis. Vertebral arteries diminutive but widely patent within the neck without stenosis, dissection or occlusion. Skeleton: No visible acute osseous finding. No discrete or worrisome osseous lesions. Other  neck: No other acute soft tissue abnormality within the neck. 7 mm right thyroid nodule noted, of doubtful significance given size and patient age, no follow-up imaging recommended (ref: J Am Coll Radiol. 2015 Feb;12(2): 143-50). Right-sided hemodialysis catheter in place. Upper chest: Visualized upper chest demonstrates no acute finding. Review of the MIP images confirms the above findings CTA HEAD FINDINGS Anterior circulation: Petrous segments patent bilaterally. Mild atheromatous change within the carotid siphons without hemodynamically significant stenosis. No made of a subtle 2 mm focal outpouching arising from the proximal cavernous right ICA, which could reflect a small aneurysm versus vascular infundibulum (series 7, image 254). A1 segments patent bilaterally. Normal anterior communicating artery complex. Anterior cerebral arteries patent to their distal aspects  without stenosis. No M1 stenosis or occlusion. Normal MCA bifurcations. Distal MCA branches well perfused and symmetric. Posterior circulation: Both V4 segments patent to the vertebrobasilar junction without stenosis. Both PICA origins patent and normal. Basilar patent to its distal aspect without stenosis. Superior cerebellar arteries patent bilaterally. Both PCA supplied via the basilar as well as prominent bilateral posterior communicating arteries. Atheromatous irregularity within the bilateral P2 segments with associated mild to moderate multifocal narrowing. PCAs remain patent to their distal aspects. Venous sinuses: Grossly patent allowing for timing the contrast bolus. Anatomic variants: None significant. Review of the MIP images confirms the above findings CT Brain Perfusion Findings: ASPECTS: Not calculated. The CT perfusion portion of this exam unfortunately failed due to technical difficulty. No perfusion maps were generated. IMPRESSION: 1. Negative CTA for emergent large vessel occlusion. 2. Mild atheromatous change about the major arterial vasculature of the head and neck as above. No proximal high-grade or correctable stenosis. 3. 2 mm focal outpouching arising from the proximal cavernous right ICA, which could reflect a small aneurysm versus vascular infundibulum. Attention at follow-up recommended. 4. CT perfusion portion of this exam unfortunately failed due to technical difficulty. No perfusion maps were generated. Critical Value/emergent results were called by telephone at the time of interpretation on 05/05/2021 at 8:15 pm to provider ERIC [facility], who verbally acknowledged these results. Electronically Signed   By: Jeannine Boga M.D.   On: 05/05/2021 21:04   DG Chest Port 1 View  Result Date: 05/05/2021 CLINICAL DATA:  Shortness of breath EXAM: PORTABLE CHEST 1 VIEW COMPARISON:  05/03/2021 FINDINGS: Cardiac shadow is mildly enlarged but stable. Right jugular central line is again seen  deep in the right atrium but stable. Lungs are well aerated bilaterally with patchy stable atelectasis/scarring on the left. No acute abnormality is seen. IMPRESSION: No acute abnormality noted. Stable changes in the left base are seen. Electronically Signed   By: Inez Catalina M.D.   On: 05/05/2021 19:08   EEG adult  Result Date: 05/06/2021 [name], MD     05/06/2021  4:53 PM Patient Name: Carolyn Russo MRN: [id number] Epilepsy Attending: [name] Referring Physician/Provider: Elmyra Ricks, NP Date: 05/06/2021 Duration: 27.06 mins Patient history: 69yo F with recent stroke presented with worsening of previous stroke symptoms during HD. EEG to evaluate for seizure. Level of alertness: Awake AEDs during EEG study: None Technical aspects: This EEG study was done with scalp electrodes positioned according to the 10-20 International system of electrode placement. Electrical activity was acquired at a sampling rate of 500Hz  and reviewed with a high frequency filter of 70Hz  and a low frequency filter of 1Hz . EEG data were recorded continuously and digitally stored. Description: The posterior dominant rhythm consists of 9 Hz activity of  moderate voltage (25-35 uV) seen predominantly in posterior head regions, symmetric and reactive to eye opening and eye closing. EEG showed continuous sharply contoured 2-3Hz  delta slowing in right hemisphere as well as intermittent 3-6hz  theta-delta slowing. Spikes were also noted in right centro-parietal region. Hyperventilation and photic stimulation were not performed.   ABNORMALITY -Spike, right centro-parietal region. - Continuous slow, right hemisphere - Intermittent slow, generalized IMPRESSION: This study showed evidence of potential epileptogenicity arising from right centro-parietal region. There is also cortical dysfunction arising from right hemisphere likely secondary to underlying stroke. Additionally, there is mild diffuse encephalopathy,  nonspecific etiology. No seizures were seen throughout the recording. [name]    Labs:  Basic Metabolic Panel: Recent Labs  Lab 05/20/21 1130 05/21/21 1408 05/24/21 1253 05/26/21 1241  NA 133* 134* 130* 129*  K 4.6 3.4* 4.6 4.8  CL 99 98 96* 96*  CO2 26 30 24 24   GLUCOSE 212* 147* 170* 186*  BUN 42* 12 30* 22  CREATININE 5.96* 2.55* 6.14* 5.29*  CALCIUM 9.1 7.8* 9.3 9.3  PHOS 4.4 2.0* 4.5 4.5    CBC: Recent Labs  Lab 05/21/21 1408 05/24/21 1253 05/26/21 1241  WBC 8.5 11.8* 10.3  HGB 9.8* 10.6* 10.8*  HCT 29.5* 30.2* 31.9*  MCV 89.1 85.8 87.6  PLT 297 325 348    CBG: Recent Labs  Lab 05/25/21 1115 05/25/21 1621 05/25/21 2132 05/26/21 0621 05/26/21 1123  GLUCAP 160* 118* 159* 108* 191*   Family history.  Mother with abdominal melanoma Father with Alzheimer's disease.  Negative for colon cancer esophageal cancer or rectal cancer  Brief HPI:   Andrika TANDY GRAWE is a 70 y.o. right-handed female with history of significant hypertension obesity with BMI 34.31 hyperlipidemia newly diagnosed end-stage renal disease with hemodialysis diabetes mellitus CAD maintained on aspirin iron deficiency anemia and asthma.  Patient with latest admission 02/1719 22-6 11/12/2020 for acute on chronic diastolic congestive heart failure exacerbation.  Per chart review lives with spouse.  Two-level home.  Independent with a cane prior to admission.  Presented 04/16/2021 with left-sided weakness x3 days and slurred speech with bouts of vomiting.  It was noted patient had initially presented to the ER couple of days prior to the latest admission with complaints of left-sided weakness but left without being formally evaluated.  Cranial CT scan showed no acute abnormalities.  Patient did not receive tPA.  MRI of the brain showed a 2.1 cm acute ischemic nonhemorrhagic right basal ganglia infarction.  MRA angiogram of head and neck grossly negative no large vessel occlusion or high-grade stenosis.   Most recent echocardiogram with ejection fraction of 26% grade 2 diastolic dysfunction.  Admission chemistries hemoglobin 8.8-9.0 creatinine 3.58 hemoglobin A1c 5.2.  Hospital course gastroenterology services consulted 04/18/2021 after patient had multiple large bloody bowel movements with clots.  Underwent upper endoscopy 04/18/2021 showing normal esophagus and stomach and the cardia and gastric fundus normal on retroflexion and also underwent flexible sigmoidoscopy showing hemorrhoids found on perianal exam external and internal.  Patient had initially been placed on aspirin and Plavix for CVA prophylaxis held due to suspected lower GI bleed awaiting plan on resuming anticoagulation.  Follow-up renal services hemodialysis ongoing.  Diet slowly advance.  Due to patient decreased functional mobility left side weakness was admitted for a comprehensive rehab program.   Hospital Course: Jackelynn NYANNA HEIDEMAN was admitted to rehab 05/07/2021 for inpatient therapies to consist of PT, ST and OT at least three hours five days a week. Past  admission physiatrist, therapy team and rehab RN have worked together to provide customized collaborative inpatient rehab.  Pertaining to patient's nonhemorrhagic right basal ganglia infarction aspirin and Plavix has been resumed and monitored.  No further bleeding episodes.  Blood pressure controlled on Coreg.  Ritalin was added to help patient sustained better attention to task and focus.  She did have some insomnia doing well with trazodone as well as melatonin.  She remained on Keppra for seizure prophylaxis.  Blood sugars monitored Lantus insulin as directed.  Subcutaneous heparin was initiated for DVT prophylaxis.  Pain managed with use of Voltaren gel.  Crestor ongoing for hyperlipidemia.  Hemodialysis ongoing as per renal services.  Patient did have a recent fistula placed for hemodialysis monitored per vascular surgery.  Chronic diastolic congestive heart failure monitoring for any  signs of fluid overload.   Blood pressures were monitored on TID basis and soft and monitored  Diabetes has been monitored with ac/hs CBG checks and SSI was use prn for tighter BS control.    Rehab course: During patient's stay in rehab weekly team conferences were held to monitor patient's progress, set goals and discuss barriers to discharge. At admission, patient required minimal guard side-lying to sitting supervision for rolling minimal assist sit to stand minimal assist 15 feet rolling walker  Physical exam.  Blood pressure 150/76 pulse 85 temperature 98 respiration 20 oxygen saturation 90% room air Constitutional.  No acute distress HEENT Head.  Normocephalic and atraumatic Eyes.  Pupils round and reactive to light no discharge without nystagmus Neck.  Supple nontender no JVD without thyromegaly Cardiac regular rate and rhythm any extra sounds or murmur heard Abdomen.  Soft nontender positive bowel sounds without rebound Respiratory effort normal no respiratory distress without wheeze Neurologic.  Patient alert makes eye contact with examiner provides name and age follows commands.  Fair insight.  Provide biographical information.  Mild left central 7.  Left upper extremity 2-2+/5 deltoid, 3/5 bicep, tricep and 3+/5 wrist and HI.  Left lower extremity 3-4/5 proximal to distal.  Right upper extremity right lower extremity 4-5/5 proximal to distal.  He/She  has had improvement in activity tolerance, balance, postural control as well as ability to compensate for deficits. He/She has had improvement in functional use RUE/LUE  and RLE/LLE as well as improvement in awareness.  Patient with slow progressive gains.  Needed encouragement times participate.  She does perseverate at times.  Bed mobility supervision sit to stand contact-guard rolling walker.  Stand pivot transfer to wheelchair with rolling walker supervision.  Ascends and descends four 6 inch stairs with right handrail laterally with  minimal assist for safety.  Ambulates 25 feet x 2 rolling walker contact-guard assist.  Full family teaching completed plan discharged to home       Disposition: Discharged to home    Diet: Renal diet  Special Instructions: No driving smoking or alcohol  Continue hemodialysis as directed  Medications at discharge 1.  Tylenol as needed 2.  Aspirin 81 mg p.o. daily 3.  B complex with vitamin C1 tablet daily 4.  Coreg 3.125 mg p.o. twice daily 5.  Vitamin D 2000 units p.o. daily 6.  Plavix 75 mg p.o. daily 7.  Voltaren gel 2 g 4 times daily to affected area 8.  Anusol cream as needed hemorrhoids 9.  Lantus insulin 13 units subcutaneous daily 10.  Keppra 500 mg p.o. nightly 11.  Melatonin 3 mg p.o. nightly 12.  Ritalin 5 mg p.o. twice daily 13.  Dulera 2 puffs twice daily 14.  Protonix 40 mg p.o. daily 15.  MiraLAX daily hold for loose stools 16.  Crestor 20 mg p.o. daily 17.  Trazodone 100 mg p.o. nightly patient can refuse  30-35 minutes were spent completing discharge summary and discharge planning  Discharge Instructions     Ambulatory referral to Neurology   Complete by: As directed    Follow up with stroke clinic NP (Jessica Vanschaick or Cecille Rubin, if both not available, consider Zachery Dauer, or Ahern) at [facility] in about 4 weeks. Thanks.   Ambulatory referral to Neurology   Complete by: As directed    An appointment is requested in approximately: 4 weeks right basal ganglia infarction   Ambulatory referral to Physical Medicine Rehab   Complete by: As directed    Moderate complexity follow-up 1 to 2 weeks right basal ganglia infarction        Follow-up Information     [facility]. Schedule an appointment as soon as possible for a visit in 1 month(s).   Specialty: Neurology Why: stroke clinic Contact information: [place] [phone]        Courtney Heys, MD Follow up.    Specialty: Physical Medicine and Rehabilitation Why: Office to call for appointment Contact information: [address] [place] [address] [phone]         Gatha Mayer, MD Follow up.   Specialty: Gastroenterology Why: Call for appointment Contact information: [address] [name] [address] [phone]                 Signed: Cathlyn Parsons 05/26/2021, 6:17 PM

## 2021-05-26 NOTE — Progress Notes (Signed)
Speech Language Pathology Discharge Summary  Patient Details  Name: Carolyn Russo MRN: [id number] Date of Birth: 1951/10/19  Today's Date: 05/26/2021 SLP Individual Time: 0930-1015 SLP Individual Time Calculation (min): 45 min   Skilled Therapeutic Interventions:  Patient seen for skilled ST session focusing on cognitive function goals. Patient was lethargic and did have difficulty maintaining alertness, especially during first half of session. During guided discussion aimed at patient's ability to anticipate needs with discharge home planned for next date. Patient had difficulty verbalizing any questions she would ask when she and husband interview home care aides, requiring SLP to provide modA cues. When asked if patient had any questions or concerns, she stated she was wanting to make sure she did not forget of her belongings when she discharges home tomorrow.      Patient has met 7 of 7 long term goals.  Patient to discharge at [facility] level.  Reasons goals not met:   N/A  Clinical Impression/Discharge Summary: Patient met 7/7 LTG's and currently is at intermittent supervision level for PO intake of Dys 3 solids, thin liquids and minA level for cognitive functioning. Patient's progress was negatively impacted by her fluctuating level of alertness resulting in some days where she was barely able to stay alert. SLP completed all education with patient and spouse for cognitive function, speech intelligibility and swallow function. Recommendation is for [facility] SLP to continue with improving her overall cognitive functioning.  Care Partner:  Caregiver Able to Provide Assistance: Yes  Type of Caregiver Assistance: Physical;Cognitive  Recommendation:  24 hour supervision/assistance;Home Health SLP  Rationale for SLP Follow Up: Maximize cognitive function and independence;Maximize swallowing safety   Equipment: None for ST   Reasons for discharge: Discharged from hospital   Patient/Family  Agrees with Progress Made and Goals Achieved: Yes    Sonia Baller, MA, CCC-SLP Speech Therapy

## 2021-05-26 NOTE — Progress Notes (Signed)
Occupational Therapy Discharge Summary  Patient Details  Name: Carolyn Russo MRN: [id number] Date of Birth: Feb 25, 1951    Patient has met 4 of 10 long term goals due to improved activity tolerance, improved balance, postural control, functional use of  LEFT upper extremity, and improved coordination.  Patient to discharge at [facility] Assist level.  Patient's care partner is independent to provide the necessary physical and cognitive assistance at discharge.  Pt does require assist with most ADLs at Mod-Max overall but is CGA/close supervision for ADL transfers with RW and R hand splint. Pt has been very self limiting, limiting progress with self care performance. Pt has been very pleasant but often directs care and limits her participation. Husband is aware and has been present for several sessions. Pt to go home with husband and hired aide for recommended 24/7 supervision.   Reasons goals not met: Pt did not meet most ADL goals as she is Mod A for oral hygiene, Min A for self feeding with AE/built up handles, Mod A for UB dress, Max A for LB dress, and Mod A for toileting. Pt is very self limiting and has limited progress toward OT goals.   Recommendation:  Patient will benefit from ongoing skilled OT services in home health setting to continue to advance functional skills in the area of BADL and Reduce care partner burden.  Equipment: No equipment provided  Reasons for discharge: discharge from hospital  Patient/family agrees with progress made and goals achieved: Yes  OT Discharge Precautions/Restrictions  Precautions Precautions: Fall Precaution Comments: L hem, impaired RUE, R port Restrictions Weight Bearing Restrictions: No Vital Signs Therapy Vitals Temp: (!) 97.4 F (36.3 C) Temp Source: Oral Pulse Rate: 76 Resp: 16 BP: (!) 127/57 Patient Position (if appropriate): Lying Oxygen Therapy SpO2: 100 % O2 Device: Room Air Pain Pain Assessment Pain Scale: 0-10 Pain  Score: 0-No pain ADL ADL Eating: Minimal assistance Where Assessed-Eating: Wheelchair Grooming: Minimal assistance Where Assessed-Grooming: Sitting at sink Upper Body Bathing:  (per staff) Where Assessed-Upper Body Bathing: Sitting at sink Lower Body Bathing: Moderate assistance (per staff) Where Assessed-Lower Body Bathing: Sitting at sink Upper Body Dressing: Moderate assistance (per staff) Where Assessed-Upper Body Dressing: Sitting at sink Lower Body Dressing: Maximal assistance (per staff) Where Assessed-Lower Body Dressing: Sitting at sink Toileting: Moderate assistance Where Assessed-Toileting: Glass blower/designer: Close supervision Toilet Transfer Method: Counselling psychologist: Grab bars, Raised toilet seat, Bedside commode Tub/Shower Transfer: Not assessed Social research officer, government: Not assessed Vision Baseline Vision/History: Wears glasses Wears Glasses: Reading only Patient Visual Report: No change from baseline Praxis Praxis: Impaired Praxis Impairment Details: Motor planning Cognition Overall Cognitive Status: Impaired/Different from baseline Arousal/Alertness: Lethargic Orientation Level: Oriented X4 Attention: Sustained Focused Attention: Appears intact Sustained Attention: Impaired Sustained Attention Impairment: Verbal basic;Functional basic;Verbal complex Memory: Impaired Memory Impairment: Storage deficit;Retrieval deficit Awareness: Impaired Awareness Impairment: Anticipatory impairment Problem Solving: Impaired Problem Solving Impairment: Verbal complex;Functional complex Reasoning: Appears intact Safety/Judgment: Impaired Sensation Sensation Light Touch: Impaired by gross assessment Coordination Gross Motor Movements are Fluid and Coordinated: No Fine Motor Movements are Fluid and Coordinated: No Coordination and Movement Description: gross motor movements impaired by L hemiplegia LUE > LLE, decreased posture control,  and  significant fatigue Motor  Motor Motor: Hemiplegia;Abnormal tone;Abnormal postural alignment and control Motor - Skilled Clinical Observations: L hemiplegia LUE > LLE but improved from eval, decreased RUE coordination as well Mobility  Transfers Sit to Stand: Supervision/Verbal cueing Stand to Sit: Supervision/Verbal cueing  Trunk/Postural Assessment  Cervical Assessment Cervical Assessment: Exceptions to [facility] Thoracic Assessment Thoracic Assessment: Exceptions to [facility] Lumbar Assessment Lumbar Assessment: Exceptions to [facility] Postural Control Postural Control: Deficits on evaluation  Balance Balance Balance Assessed: Yes Static Sitting Balance Static Sitting - Balance Support: No upper extremity supported;Feet supported Static Sitting - Level of Assistance: 6: Modified independent (Device/Increase time) Dynamic Sitting Balance Dynamic Sitting - Balance Support: No upper extremity supported;Feet supported;During functional activity Dynamic Sitting - Level of Assistance: 5: Stand by assistance Dynamic Sitting - Balance Activities: Lateral lean/weight shifting;Reaching for objects;Forward lean/weight shifting Static Standing Balance Static Standing - Balance Support: Bilateral upper extremity supported;During functional activity Static Standing - Level of Assistance: 5: Stand by assistance Dynamic Standing Balance Dynamic Standing - Balance Support: Bilateral upper extremity supported;During functional activity Dynamic Standing - Level of Assistance: 5: Stand by assistance Extremity/Trunk Assessment RUE Assessment RUE Assessment: Exceptions to [facility] Active Range of Motion (AROM) Comments: shoulder ROM WNL, decreased FMC/GMC for R hand and digits LUE Assessment LUE Assessment: Exceptions to [facility] LUE Body System: Neuro Brunstrum levels for arm and hand: Arm;Hand Brunstrum level for arm: Stage IV Movement is deviating from synergy Brunstrum level for hand: Stage V Independence from basic  synergies   [name] 05/26/2021, 4:47 PM

## 2021-05-26 NOTE — Progress Notes (Addendum)
[facility] Progress Note   Subjective: Up at sink, dressing. Now with ACE wrap R hand for pain. She has no complaints. Quiet but pleasant. HD today. Adjustments made to machine settings for HD today.  Seen on HD  - telling me that she has had severe chest pain upon inspiration for 2 weeks -  says has told the rehab MDs-  excited that will be discharged to SNF tomorrow .... but no nausea with HD  Objective Vitals:   05/25/21 2048 05/26/21 0433 05/26/21 0446 05/26/21 0831  BP:  (!) 162/72    Pulse:  73    Resp:  20    Temp:  98.6 F (37 C)    TempSrc:  Oral    SpO2: 98% 100%  97%  Weight:   79.4 kg   Height:       Physical Exam General: Chronically ill appearing female in NAD Heart: S1,S2 RRR No M/R/G Lungs: CTAB Abdomen: NABS Extremities: No LE edema. Dialysis Access: RIJ TDC R AVF + T/B     Additional Objective Labs: Basic Metabolic Panel: Recent Labs  Lab 05/20/21 1130 05/21/21 1408 05/24/21 1253  NA 133* 134* 130*  K 4.6 3.4* 4.6  CL 99 98 96*  CO2 26 30 24   GLUCOSE 212* 147* 170*  BUN 42* 12 30*  CREATININE 5.96* 2.55* 6.14*  CALCIUM 9.1 7.8* 9.3  PHOS 4.4 2.0* 4.5   Liver Function Tests: Recent Labs  Lab 05/20/21 1130 05/21/21 1408 05/24/21 1253  ALBUMIN 3.2* 3.2* 3.4*   No results for input(s): LIPASE, AMYLASE in the last 168 hours. CBC: Recent Labs  Lab 05/20/21 1130 05/21/21 1408 05/24/21 1253  WBC 10.4 8.5 11.8*  HGB 9.6* 9.8* 10.6*  HCT 28.8* 29.5* 30.2*  MCV 88.9 89.1 85.8  PLT 324 297 325   Blood Culture No results found for: SDES, SPECREQUEST, CULT, REPTSTATUS  Cardiac Enzymes: No results for input(s): CKTOTAL, CKMB, CKMBINDEX, TROPONINI in the last 168 hours. CBG: Recent Labs  Lab 05/25/21 0605 05/25/21 1115 05/25/21 1621 05/25/21 2132 05/26/21 0621  GLUCAP 94 160* 118* 159* 108*   Iron Studies: No results for input(s): IRON, TIBC, TRANSFERRIN, FERRITIN in the last 72  hours. @lablastinr3 @ Studies/Results: No results found. Medications:   (feeding supplement) PROSource Plus  30 mL Oral BID BM   aspirin EC  81 mg Oral Daily   B-complex with vitamin C  1 tablet Oral Daily   carvedilol  3.125 mg Oral BID WC   Chlorhexidine Gluconate Cloth  6 each Topical BID   cholecalciferol  2,000 Units Oral Daily   clopidogrel  75 mg Oral Daily   darbepoetin (ARANESP) injection - DIALYSIS  100 mcg Intravenous Q Fri-HD   diclofenac Sodium  2 g Topical QID   heparin  5,000 Units Subcutaneous Q8H   insulin aspart  0-6 Units Subcutaneous TID WC   insulin glargine  13 Units Subcutaneous Daily   levETIRAcetam  500 mg Oral QHS   melatonin  3 mg Oral QHS   methylphenidate  5 mg Oral BID WC   mometasone-formoterol  2 puff Inhalation BID   pantoprazole  40 mg Oral Daily   polyethylene glycol  17 g Oral Daily   rosuvastatin  20 mg Oral Daily   senna-docusate  2 tablet Oral BID   traZODone  100 mg Oral QHS     Dialysis Orders: MWF @ [facility] 4 hrs 180NRe 400/Autoflow 1.5 86.5 kg 2.0K/2.0 Ca TDC, heparin 2000 units bolus -  Mircera 100 mcg IV q 2 weeks (last dose 04/14/2021 - Hectorol 5 mcg IV TIW   Assessment/Plan: 1. Acute R subcortical CVA: Back in CIR as of 7/15 2. Acute LGIB: Hx hemorrhoidal bleeding in past - s/p 2U PRBCs 6/26. S/p EGD/flex sig without clear bleeding source. Plavix and ASA held, now resumed.  Hgb stable to increasing  3. ESRD: Back to usual MWF schedule - next HD Wed 8/3. Has been getting nauseated q tx-  possibly BP getting too low-  will need to be more gentle if we can. Decrease BFR to 350. Add UFP 2.  4. Dialysis Access: S/p 1st stage BVT 7/8 by Dr. Scot Dock - mild steal symptoms, tentative plan for ligation per [facility] notes however patient refused. Per Dr. Nicole Cella note, he will follow as OP. 5. HTN/volume: Variable BP - trying to avoid intradialytic hypotension, she is well below OP dry weight.  UF as tolerated. Lower EDW upon DC. 6. Anemia: Hgb  9.8--->10.6 - continue Aranesp 137mcg q Friday. 7. Secondary hyperparathyroidism: Hectoral on hold for low PTH x 2. Binder changed from Renvela pills to powder d/t issues swallowing pills, then Phos low - Renvela held-  most recent phos 4.5. 8. Nutrition: Alb low, continue supplements. 9. T2DM: Per primary. 10. Insomnia: On ritalin during day and trazodone/melatonin to get sleep/wake cycle back on track, seems to be working. Watch BP with stimulants.   11.  Dispo-  plan for discharge from rehab on 8/4.  Rita H. Brown NP-C 05/26/2021, 11:17 AM  [facility] [phone]  Patient seen and examined, agree with above note with above modifications. Seen on HD-  no nausea-  excited about leaving the hospital tomorrow-  HD today on schedule-  cont with routine HD and appropriate titration of dialysis related medications-  not is distress and O2 sat of 100 on room air so will not chase down this other complaint   Corliss Parish, MD 05/26/2021

## 2021-05-27 ENCOUNTER — Encounter (HOSPITAL_COMMUNITY): Payer: Self-pay

## 2021-05-27 ENCOUNTER — Other Ambulatory Visit (HOSPITAL_COMMUNITY): Payer: Self-pay

## 2021-05-27 ENCOUNTER — Telehealth: Payer: Medicare Other

## 2021-05-27 LAB — GLUCOSE, CAPILLARY
Glucose-Capillary: 97 mg/dL (ref 70–99)
Glucose-Capillary: 172 mg/dL — ABNORMAL HIGH (ref 70–99)

## 2021-05-27 MED ORDER — CLOPIDOGREL BISULFATE 75 MG PO TABS
75.0000 mg | ORAL_TABLET | Freq: Every day | ORAL | 0 refills | Status: DC
Start: 2021-05-27 — End: 2021-08-24
  Filled 2021-05-27: qty 30, 30d supply, fill #0

## 2021-05-27 NOTE — Progress Notes (Addendum)
[facility] Progress Note   Subjective: Signed off HD early 08/03 D/T nausea, "feeling bad". Plans to go home today. No C/Os. She has never complained to any ailment to me. Next HD at [place].  Excited about going home   Objective Vitals:   05/26/21 2054 05/27/21 0324 05/27/21 0426 05/27/21 0759  BP: 132/60 (!) 162/69    Pulse: 85 79    Resp: 20 19    Temp: 97.8 F (36.6 C) 97.8 F (36.6 C)    TempSrc: Oral     SpO2: 100% 100%  96%  Weight:   77.7 kg   Height:       Physical Exam General: Chronically ill appearing female in NAD Heart: S1,S2 RRR No M/R/G Lungs: CTAB Abdomen: NABS Extremities: No LE edema. Dialysis Access: RIJ TDC R AVF + T/B    Additional Objective Labs: Basic Metabolic Panel: Recent Labs  Lab 05/21/21 1408 05/24/21 1253 05/26/21 1241  NA 134* 130* 129*  K 3.4* 4.6 4.8  CL 98 96* 96*  CO2 30 24 24   GLUCOSE 147* 170* 186*  BUN 12 30* 22  CREATININE 2.55* 6.14* 5.29*  CALCIUM 7.8* 9.3 9.3  PHOS 2.0* 4.5 4.5   Liver Function Tests: Recent Labs  Lab 05/21/21 1408 05/24/21 1253 05/26/21 1241  ALBUMIN 3.2* 3.4* 3.4*   No results for input(s): LIPASE, AMYLASE in the last 168 hours. CBC: Recent Labs  Lab 05/20/21 1130 05/21/21 1408 05/24/21 1253 05/26/21 1241  WBC 10.4 8.5 11.8* 10.3  HGB 9.6* 9.8* 10.6* 10.8*  HCT 28.8* 29.5* 30.2* 31.9*  MCV 88.9 89.1 85.8 87.6  PLT 324 297 325 348   Blood Culture No results found for: SDES, SPECREQUEST, CULT, REPTSTATUS  Cardiac Enzymes: No results for input(s): CKTOTAL, CKMB, CKMBINDEX, TROPONINI in the last 168 hours. CBG: Recent Labs  Lab 05/26/21 0621 05/26/21 1123 05/26/21 1822 05/26/21 2121 05/27/21 0613  GLUCAP 108* 191* 119* 147* 97   Iron Studies: No results for input(s): IRON, TIBC, TRANSFERRIN, FERRITIN in the last 72 hours. @lablastinr3 @ Studies/Results: No results found. Medications:   (feeding supplement) PROSource Plus  30 mL Oral BID BM   aspirin  EC  81 mg Oral Daily   B-complex with vitamin C  1 tablet Oral Daily   carvedilol  3.125 mg Oral BID WC   Chlorhexidine Gluconate Cloth  6 each Topical BID   cholecalciferol  2,000 Units Oral Daily   clopidogrel  75 mg Oral Daily   darbepoetin (ARANESP) injection - DIALYSIS  100 mcg Intravenous Q Fri-HD   diclofenac Sodium  2 g Topical QID   heparin  5,000 Units Subcutaneous Q8H   insulin aspart  0-6 Units Subcutaneous TID WC   insulin glargine  13 Units Subcutaneous Daily   levETIRAcetam  500 mg Oral QHS   melatonin  3 mg Oral QHS   methylphenidate  5 mg Oral BID WC   mometasone-formoterol  2 puff Inhalation BID   pantoprazole  40 mg Oral Daily   polyethylene glycol  17 g Oral Daily   rosuvastatin  20 mg Oral Daily   senna-docusate  2 tablet Oral BID   traZODone  100 mg Oral QHS     Dialysis Orders: MWF @ [facility] 4 hrs 180NRe 400/Autoflow 1.5 86.5 kg 2.0K/2.0 Ca TDC, heparin 2000 units bolus - Mircera 100 mcg IV q 2 weeks (last dose 04/14/2021 - Hectorol 5 mcg IV TIW   Assessment/Plan: 1. Acute R subcortical CVA: Back in CIR  as of 7/15 2. Acute LGIB: Hx hemorrhoidal bleeding in past - s/p 2U PRBCs 6/26. S/p EGD/flex sig without clear bleeding source. Plavix and ASA held, now resumed.  Hgb stable to increasing  3. ESRD: Back to usual MWF schedule - next HD Wed 05/28/2021 at [place].Marland Kitchen Has been getting nauseated q tx-  possibly BP getting too low-  will need to be more gentle if we can. Decrease BFR to 350. Add UFP 2.  4. Dialysis Access: S/p 1st stage BVT 7/8 by Dr. Scot Dock - mild steal symptoms, tentative plan for ligation per [facility] notes however patient refused. Per Dr. Nicole Cella note, he will follow as OP. 5. HTN/volume: Variable BP - trying to avoid intradialytic hypotension, she is well below OP dry weight.  UF as tolerated. Lower EDW upon DC. 6. Anemia: Hgb 9.8--->10.6 - continue Aranesp 142mcg q Friday. 7. Secondary hyperparathyroidism: Hectoral on hold for low PTH x 2. Binder  changed from Renvela pills to powder d/t issues swallowing pills, then Phos low - Renvela held-  most recent phos 4.5. 8. Nutrition: Alb low, continue supplements. 9. T2DM: Per primary. 10. Insomnia: On ritalin during day and trazodone/melatonin to get sleep/wake cycle back on track, seems to be working. Watch BP with stimulants.   11.  Dispo-  plan for discharge from rehab on today.   Rita H. Brown NP-C 05/27/2021, 9:20 AM  [employer] [phone]  Patient seen and examined, agree with above note with above modifications.  Awaiting discharge from rehab to home -  will need to delve into these sxms that she has been having with dialysis more in the oP setting -  BP does not appear really low-  UF only [address] Corliss Parish, MD 05/27/2021

## 2021-05-27 NOTE — Progress Notes (Signed)
PA Olin Hauser in with pt/family to discuss discharge instructions. Pt and family in agreement with no further questions. All questions answered. Pt discharged with all belongings and medications to private vehicle per wheelchair. No complications noted. Sheela Stack, LPN

## 2021-05-27 NOTE — Consult Note (Signed)
   [facility] CM Inpatient Consult   05/27/2021  Carolyn Russo 07-26-1951 [id number]  [place] [facility] [ACO] Patient:  Medicare CMS DCE  Embedded [facility], Chronic Care Management for TOC follow up  Follow up:  Post rehab transition  Reviewed for disposition and patient is active with Embedded Chronic Care Management program for transition of care follow up and needs.  Plan:  Will alert CCM team members of transition of care plans for follow up.  For questions,  Natividad Brood, RN BSN [facility] Liaison  [phone] business mobile phone Toll free office [phone]  Fax number: [phone] [name]

## 2021-05-27 NOTE — Progress Notes (Signed)
Inpatient Rehabilitation Care Coordinator Discharge Note  The overall goal for the admission was met for:   Discharge location: Yes. D/c to home with plans for private aide support during the day, and support from husband in the evening.   Length of Stay: 19 days.   Discharge activity level: Supervision to Contact Guard  Home/community participation: Yes. Limited.   Services provided included: MD, RD, PT, OT, SLP, RN, CM, TR, Pharmacy, Neuropsych, and SW  Financial Services: Medicare and Private Insurance: BCBS  Choices offered to/list presented to:yes  Follow-up services arranged: Home Health: [facility] for HHPT/OT/SLP/Aide and DME: [employer] for rolling walker and transport chair (to be shipped to home)  Comments (or additional information):  Patient/Family verbalized understanding of follow-up arrangements: Yes  Individual responsible for coordination of the follow-up plan: Contact pt husband Dr. Egler #[phone]  Confirmed correct DME delivered:  A  05/27/2021     A  

## 2021-05-27 NOTE — Progress Notes (Signed)
PROGRESS NOTE     Subjective/Complaints:  Happy to get home today. No new complaints  ROS: Patient denies fever, rash, sore throat, blurred vision, nausea, vomiting, diarrhea, cough, shortness of breath or chest pain,   headache, or mood change.    Objective:       Physical Exam: Vital Signs    Vitals with BMI 05/27/2021 05/26/2021 05/26/2021  Height - - -  Weight 171 lbs 5 oz - -  BMI 13.08 - -  Systolic [phone]  Diastolic 69 60 65  Pulse 79 85 86     Constitutional: No distress . Vital signs reviewed. HEENT: NCAT, EOMI, oral membranes moist Neck: supple Cardiovascular: RRR without murmur. No JVD    Respiratory/Chest: CTA Bilaterally without wheezes or rales. Normal effort    GI/Abdomen: BS +, non-tender, non-distended Ext: no clubbing, cyanosis, or edema Psych: pleasant and cooperative  Musculoskeletal:        R wrist/hand looks stable today, however also rechecked strength- R grip 2+/5 in R hand and Finger abd 2/5 at best- no change today Skin:    General: Skin is warm and dry. Neurological:    Comments: alert, follows commands.   Fair insight and awareness. Provided biographical information. Normal language, speech slightly slurred. Mild left central 7. LUE 2 to 2+/5 deltoid, 3 to 3+/5 biceps, triceps and 3+/5 wrist and HI. LLE 3+/5 prox to distal. RUE 4/5 and RLE 5/5 prox to distal.    Assessment/Plan: 1. Functional deficits which require 3+ hours per day of interdisciplinary therapy in a comprehensive inpatient rehab setting. Physiatrist is providing close team supervision and 24 hour management of active medical problems listed below. Physiatrist and rehab team continue to assess barriers to discharge/monitor patient progress toward functional and medical goals   Care Tool:   Bathing   Body parts bathed by patient: Right arm, Left arm, Chest, Abdomen, Front perineal area, Right upper leg, Left upper leg     Body parts bathed by helper: Right lower leg, Left lower leg, Buttocks    Bathing assist Assist Level: Minimal Assistance - Patient > 75%    Upper Body Dressing/Undressing Upper body dressing What is the patient wearing?: Pull over shirt   Upper body assist Assist Level: Minimal Assistance - Patient > 75%   Lower Body Dressing/Undressing Lower body dressing       What is the patient wearing?: Pants, Incontinence brief        Lower body assist Assist for lower body dressing: Minimal Assistance - Patient > 75%     Toileting Toileting   Toileting assist Assist for toileting: Moderate Assistance - Patient 50 - 74%    Transfers Chair/bed transfer   Transfers assist     Chair/bed transfer assist level: Minimal Assistance - Patient > 75%    Locomotion Ambulation     Ambulation assist       Assist level: Minimal Assistance - Patient > 75% Assistive device: Walker-rolling Max distance: 50'    Walk 10 feet activity     Assist     Assist level: Minimal Assistance - Patient > 75% Assistive device: Walker-rolling    Walk 50 feet activity  Assist Walk 50 feet with 2 turns activity did not occur: Safety/medical concerns   Assist level: Minimal Assistance - Patient > 75% Assistive device: Walker-rolling      Walk 150 feet activity     Assist Walk 150 feet activity did not occur: Safety/medical concerns        Walk 10 feet on uneven surface activity     Assist Walk 10 feet on uneven surfaces activity did not occur: Safety/medical concerns         Wheelchair         Assist Will patient use wheelchair at discharge?:  (TBD) Type of Wheelchair: Manual   Wheelchair assist level: Dependent - Patient 0% Max wheelchair distance: 150'      Wheelchair 50 feet with 2 turns activity       Assist           Assist Level: Dependent - Patient 0%    Wheelchair 150 feet activity       Assist       Assist Level: Dependent - Patient 0%      Medical Problem List and Plan: 1.  Left side weakness secondary to nonhemorrhagic right basal ganglia infarction             -dc home today  -[facility] f/u 2.  Impaired mobility: -DVT/anticoagulation:  continue SCD             -antiplatelet therapy:ASA             Plavix restarted. 3. Right hip bursitis: continue Tramadol 50 mg every 12 hours as needed, ice, ROM, lidocaine patch  7/27- R wrist pain better- con't regimen  7/28- having leg cramping- if voltaren doesn't help (gel) will try Skelaxin since less sedating muscle relaxant. Nursing ot let me know.   8/4- pain control improved 4. Anxiety/stroke poor initiation: continue Xanax 0.5 mg 3 times daily as needed.  Provide emotional support             initiation improved with ritalin             -antipsychotic agents: N/A 5. Neuropsych: This patient is ?capable of making decisions on her own behalf. 6. Skin/Wound Care: Routine skin checks 7. Fluids/Electrolytes/Nutrition: Routine in and outs with follow-up chemistries 8.  Acute lower GI bleed with blood loss anemia.  Follow-up GI services.  Resume ASA as above. -aranesp -labs with HD 9.  Chronic diastolic congestive heart failure.  Monitor for any signs of fluid overload.  Continue Lasix 40-80 mg Monday Wednesday Friday             -weights controlled, volume mgt with HD             Filed Weights   05/26/21 1400 05/26/21 1806 05/27/21 0426  Weight: 81.3 kg 80 kg 77.7 kg    10.  Newly Dx'd End-stage renal disease.  Hemodialysis as per renal services             7/12- questionable steal syndrome since Fistula placement- will monitor per Vascular- to see if  needs additional intervention  7/19- R hand still cooler to touch- also has decreased dexterity- will likely need EMG after d/c.   HD after therapies each day 11.  Hypertension.  Increase Coreg to 12.5 mg twice daily. Aggressive filtration with ultradialysis tomorrow             8/4 --borderline control-f/u as outpt Vitals:    05/27/21 0324 05/27/21 0759  BP: (!) 162/69  Pulse: 79   Resp: 19   Temp: 97.8 F (36.6 C)   SpO2: 100% 96%      12.  Asthma.  Continue inhalers as directed             - was getting Home O2 prior to admission- will likely need to go home on 2L O2-   Was trying to get at home, but hadn't yet.             7/1- con't nebs prn, home O2 ?PRN only             7/3 lungs very clear today, sats 99% on RA. Some of SOB may be behaviorally related. Reassured pt today that lungs sounded fine.                         -encouraged FV/IS, OOB             7/7: ordered CXT-stable, ordered incentive spirometer 13.Hyperlipidemia.Continue Crestor 14.Obesity.BMI 34.31.Dietery follow up 15.  Diabetes mellitus.hemoglobin AIc 5.2.    8/4 reasonable control Recent Labs    05/26/21 1822 05/26/21 2121 05/27/21 0613  GLUCAP 119* 147* 97    16. Leukocytosis: trending down, 10.3 8/3               17. Constipation: moving bowels regularly   -belly hurts more when she's constipated  18. Fistula placement: successful, new fistula accessed without complications.  7/20- possible steal syndrome of RUE- cooler hand on R- might need EMG after d/c.   7/29- Dr Scot Dock note suggested ligating fistula- will likely need another? However this one is causing problems with strength/sensation/dexterity- plan for possibly next week.  19. Left shoulder pain: ordered lidocaine patch and voltaren gel. 20. Lethargy              - tolerating Ritalin 5 mg BID= doing better   21. Insomnia/mixing of sleep wake cycle     7/22- slept a few hours last night- slightly better- will increase Trazodone to 100 mg at 8pm nightly to reduce sleepiness in AM.   8/4-- much more awake --continue ritalin for now as outpt 22.  Right upper extremity discomfort, difficult to get a good history.  Today she is complaining more of wrist pain when using the walker.  Her exam is unremarkable no history of trauma.  We will trial some Voltaren gel to the  right wrist 4 times per day  7/25- per renal note, still c/o R hand/wrist pain- has been on/off since fistula placed.   7/27- denies pain- con't regimen  7/29- pain better but due to neurological issues from RUE fistula- Dr Scot Dock to look at ligating fistula next week-   8/1- Per Dr Nicole Cella note, [place] and Dr [name] don't want right now and will see him in f/u in 3-4 weeks- .

## 2021-05-28 ENCOUNTER — Telehealth: Payer: Self-pay

## 2021-05-28 DIAGNOSIS — I160 Hypertensive urgency: Secondary | ICD-10-CM | POA: Diagnosis not present

## 2021-05-28 DIAGNOSIS — I5033 Acute on chronic diastolic (congestive) heart failure: Secondary | ICD-10-CM | POA: Diagnosis not present

## 2021-05-28 DIAGNOSIS — D509 Iron deficiency anemia, unspecified: Secondary | ICD-10-CM | POA: Diagnosis not present

## 2021-05-28 DIAGNOSIS — Z992 Dependence on renal dialysis: Secondary | ICD-10-CM | POA: Diagnosis not present

## 2021-05-28 DIAGNOSIS — N2581 Secondary hyperparathyroidism of renal origin: Secondary | ICD-10-CM | POA: Diagnosis not present

## 2021-05-28 DIAGNOSIS — I6781 Acute cerebrovascular insufficiency: Secondary | ICD-10-CM | POA: Diagnosis not present

## 2021-05-28 DIAGNOSIS — D631 Anemia in chronic kidney disease: Secondary | ICD-10-CM | POA: Diagnosis not present

## 2021-05-28 DIAGNOSIS — K922 Gastrointestinal hemorrhage, unspecified: Secondary | ICD-10-CM | POA: Diagnosis not present

## 2021-05-28 DIAGNOSIS — I132 Hypertensive heart and chronic kidney disease with heart failure and with stage 5 chronic kidney disease, or end stage renal disease: Secondary | ICD-10-CM | POA: Diagnosis not present

## 2021-05-28 DIAGNOSIS — E1122 Type 2 diabetes mellitus with diabetic chronic kidney disease: Secondary | ICD-10-CM | POA: Diagnosis not present

## 2021-05-28 DIAGNOSIS — N186 End stage renal disease: Secondary | ICD-10-CM | POA: Diagnosis not present

## 2021-05-28 NOTE — Telephone Encounter (Signed)
Transition Care Management Follow-up Telephone Call Date of discharge and from where: 05/27/2021 [facility]  How have you been since you were released from the hospital? Pt states she is feels alright.  Any questions or concerns? No  Items Reviewed: Did the pt receive and understand the discharge instructions provided? Yes  Medications obtained and verified? Yes  Other? Yes  Any new allergies since your discharge? No  Dietary orders reviewed? Yes Do you have support at home? Yes   Home Care and Equipment/Supplies: Were home health services ordered? not applicable If so, what is the name of the agency? N/a   Has the agency set up a time to come to the patient's home? not applicable Were any new equipment or medical supplies ordered?  No What is the name of the medical supply agency? N/a  Were you able to get the supplies/equipment? not applicable Do you have any questions related to the use of the equipment or supplies? No  Functional Questionnaire: (I = Independent and D = Dependent) ADLs:  I  Bathing/Dressing- I  Meal Prep- I  Eating- I  Maintaining continence- I  Transferring/Ambulation- D  Managing Meds- I  Follow up appointments reviewed:  PCP Hospital f/u appt confirmed? Yes  Scheduled to see Minette Brine on 06/14/21 @ [facility] . [facility] f/u appt confirmed? No  Scheduled to see N/A  on N/A @ N/A . Are transportation arrangements needed? No  If their condition worsens, is the pt aware to call PCP or go to the Emergency Dept.? Yes Was the patient provided with contact information for the PCP's office or ED? Yes Was to pt encouraged to call back with questions or concerns? Yes

## 2021-05-29 DIAGNOSIS — Z794 Long term (current) use of insulin: Secondary | ICD-10-CM | POA: Diagnosis not present

## 2021-05-29 DIAGNOSIS — I69354 Hemiplegia and hemiparesis following cerebral infarction affecting left non-dominant side: Secondary | ICD-10-CM | POA: Diagnosis not present

## 2021-05-29 DIAGNOSIS — I081 Rheumatic disorders of both mitral and tricuspid valves: Secondary | ICD-10-CM | POA: Diagnosis not present

## 2021-05-29 DIAGNOSIS — Z992 Dependence on renal dialysis: Secondary | ICD-10-CM | POA: Diagnosis not present

## 2021-05-29 DIAGNOSIS — Z79899 Other long term (current) drug therapy: Secondary | ICD-10-CM | POA: Diagnosis not present

## 2021-05-29 DIAGNOSIS — Z7902 Long term (current) use of antithrombotics/antiplatelets: Secondary | ICD-10-CM | POA: Diagnosis not present

## 2021-05-29 DIAGNOSIS — Z8701 Personal history of pneumonia (recurrent): Secondary | ICD-10-CM | POA: Diagnosis not present

## 2021-05-29 DIAGNOSIS — I5033 Acute on chronic diastolic (congestive) heart failure: Secondary | ICD-10-CM | POA: Diagnosis not present

## 2021-05-29 DIAGNOSIS — Z9181 History of falling: Secondary | ICD-10-CM | POA: Diagnosis not present

## 2021-05-29 DIAGNOSIS — E785 Hyperlipidemia, unspecified: Secondary | ICD-10-CM | POA: Diagnosis not present

## 2021-05-29 DIAGNOSIS — I132 Hypertensive heart and chronic kidney disease with heart failure and with stage 5 chronic kidney disease, or end stage renal disease: Secondary | ICD-10-CM | POA: Diagnosis not present

## 2021-05-29 DIAGNOSIS — N186 End stage renal disease: Secondary | ICD-10-CM | POA: Diagnosis not present

## 2021-05-29 DIAGNOSIS — J45909 Unspecified asthma, uncomplicated: Secondary | ICD-10-CM | POA: Diagnosis not present

## 2021-05-29 DIAGNOSIS — I2510 Atherosclerotic heart disease of native coronary artery without angina pectoris: Secondary | ICD-10-CM | POA: Diagnosis not present

## 2021-05-29 DIAGNOSIS — Z7951 Long term (current) use of inhaled steroids: Secondary | ICD-10-CM | POA: Diagnosis not present

## 2021-05-29 DIAGNOSIS — E1122 Type 2 diabetes mellitus with diabetic chronic kidney disease: Secondary | ICD-10-CM | POA: Diagnosis not present

## 2021-05-29 DIAGNOSIS — D631 Anemia in chronic kidney disease: Secondary | ICD-10-CM | POA: Diagnosis not present

## 2021-05-29 DIAGNOSIS — Z79891 Long term (current) use of opiate analgesic: Secondary | ICD-10-CM | POA: Diagnosis not present

## 2021-05-29 DIAGNOSIS — Z7982 Long term (current) use of aspirin: Secondary | ICD-10-CM | POA: Diagnosis not present

## 2021-05-31 ENCOUNTER — Telehealth: Payer: Self-pay

## 2021-05-31 ENCOUNTER — Telehealth: Payer: Self-pay | Admitting: Registered Nurse

## 2021-05-31 DIAGNOSIS — Z992 Dependence on renal dialysis: Secondary | ICD-10-CM | POA: Diagnosis not present

## 2021-05-31 DIAGNOSIS — N186 End stage renal disease: Secondary | ICD-10-CM | POA: Diagnosis not present

## 2021-05-31 DIAGNOSIS — D631 Anemia in chronic kidney disease: Secondary | ICD-10-CM | POA: Diagnosis not present

## 2021-05-31 DIAGNOSIS — N2581 Secondary hyperparathyroidism of renal origin: Secondary | ICD-10-CM | POA: Diagnosis not present

## 2021-05-31 DIAGNOSIS — D509 Iron deficiency anemia, unspecified: Secondary | ICD-10-CM | POA: Diagnosis not present

## 2021-05-31 NOTE — Telephone Encounter (Signed)
Called pt leaving VM. Pt has to be seen within 14 day period, pt has apt scheduled on 8/22 she has to be seen before. Called to reschedule pt.

## 2021-05-31 NOTE — Telephone Encounter (Signed)
Spoke with Dr [name] regarding his wife Carolyn Russo, she dialyze every Monday, Wednesday and Friday. To accommodate his work schedule and her dialysis schedule her appointment was changed to 06/24/2021. Dr Dagoberto Ligas is aware of the above, and agrees, with the above.

## 2021-05-31 NOTE — Telephone Encounter (Signed)
Transitional Care call Transitional Questions answered by Dr [name]  ( Husband)  Patient name: Carolyn Russo DOB: August 10, 1951 Are you/is patient experiencing any problems since coming home? No Are there any questions regarding any aspect of care? No Are there any questions regarding medications administration/dosing? No Are meds being taken as prescribed? Yes "Patient should review meds with caller to confirm" Medication List Reviewed Have there been any falls? No Has Home Health been to the house and/or have they contacted you? Yes: [facility]  If not, have you tried to contact them? NA Can we help you contact them? NA Are bowels and bladder emptying properly? ESRD: Monday, Wednesday and Friday. Moving Bowels Properly. Are there any unexpected incontinence issues? NA If applicable, is patient following bowel/bladder programs? NA Any fevers, problems with breathing, unexpected pain? No Are there any skin problems or new areas of breakdown? No Has the patient/family member arranged specialty MD follow up (ie cardiology/neurology/renal/surgical/etc.)?  This provider placed a call to [facility] for Leonardo appointment, no answer. Left message.  Dr [name] will call Dr Carlean Purl to schedule HFU appointment, he verbalizes understanding.  Can we help arrange? See above Does the patient need any other services or support that we can help arrange? No Are caregivers following through as expected in assisting the patient? yes Has the patient quit smoking, drinking alcohol, or using drugs as recommended? (                        )  Appointment date/time 06/24/2021  arrival time 2:45 for 3:00 pm with Elray Mcgregor, at  [place]

## 2021-06-01 DIAGNOSIS — N186 End stage renal disease: Secondary | ICD-10-CM | POA: Diagnosis not present

## 2021-06-01 DIAGNOSIS — I69354 Hemiplegia and hemiparesis following cerebral infarction affecting left non-dominant side: Secondary | ICD-10-CM | POA: Diagnosis not present

## 2021-06-01 DIAGNOSIS — E1122 Type 2 diabetes mellitus with diabetic chronic kidney disease: Secondary | ICD-10-CM | POA: Diagnosis not present

## 2021-06-01 DIAGNOSIS — I132 Hypertensive heart and chronic kidney disease with heart failure and with stage 5 chronic kidney disease, or end stage renal disease: Secondary | ICD-10-CM | POA: Diagnosis not present

## 2021-06-01 DIAGNOSIS — D631 Anemia in chronic kidney disease: Secondary | ICD-10-CM | POA: Diagnosis not present

## 2021-06-01 DIAGNOSIS — I5033 Acute on chronic diastolic (congestive) heart failure: Secondary | ICD-10-CM | POA: Diagnosis not present

## 2021-06-02 DIAGNOSIS — D631 Anemia in chronic kidney disease: Secondary | ICD-10-CM | POA: Diagnosis not present

## 2021-06-02 DIAGNOSIS — N2581 Secondary hyperparathyroidism of renal origin: Secondary | ICD-10-CM | POA: Diagnosis not present

## 2021-06-02 DIAGNOSIS — Z992 Dependence on renal dialysis: Secondary | ICD-10-CM | POA: Diagnosis not present

## 2021-06-02 DIAGNOSIS — D509 Iron deficiency anemia, unspecified: Secondary | ICD-10-CM | POA: Diagnosis not present

## 2021-06-02 DIAGNOSIS — N186 End stage renal disease: Secondary | ICD-10-CM | POA: Diagnosis not present

## 2021-06-03 DIAGNOSIS — I69354 Hemiplegia and hemiparesis following cerebral infarction affecting left non-dominant side: Secondary | ICD-10-CM | POA: Diagnosis not present

## 2021-06-03 DIAGNOSIS — D631 Anemia in chronic kidney disease: Secondary | ICD-10-CM | POA: Diagnosis not present

## 2021-06-03 DIAGNOSIS — N186 End stage renal disease: Secondary | ICD-10-CM | POA: Diagnosis not present

## 2021-06-03 DIAGNOSIS — I132 Hypertensive heart and chronic kidney disease with heart failure and with stage 5 chronic kidney disease, or end stage renal disease: Secondary | ICD-10-CM | POA: Diagnosis not present

## 2021-06-03 DIAGNOSIS — E1122 Type 2 diabetes mellitus with diabetic chronic kidney disease: Secondary | ICD-10-CM | POA: Diagnosis not present

## 2021-06-03 DIAGNOSIS — I5033 Acute on chronic diastolic (congestive) heart failure: Secondary | ICD-10-CM | POA: Diagnosis not present

## 2021-06-04 ENCOUNTER — Telehealth: Payer: Medicare Other

## 2021-06-04 ENCOUNTER — Encounter: Payer: Medicare Other | Admitting: Registered Nurse

## 2021-06-04 ENCOUNTER — Telehealth: Payer: Self-pay

## 2021-06-04 DIAGNOSIS — Z992 Dependence on renal dialysis: Secondary | ICD-10-CM | POA: Diagnosis not present

## 2021-06-04 DIAGNOSIS — N2581 Secondary hyperparathyroidism of renal origin: Secondary | ICD-10-CM | POA: Diagnosis not present

## 2021-06-04 DIAGNOSIS — D631 Anemia in chronic kidney disease: Secondary | ICD-10-CM | POA: Diagnosis not present

## 2021-06-04 DIAGNOSIS — N186 End stage renal disease: Secondary | ICD-10-CM | POA: Diagnosis not present

## 2021-06-04 DIAGNOSIS — D509 Iron deficiency anemia, unspecified: Secondary | ICD-10-CM | POA: Diagnosis not present

## 2021-06-04 NOTE — Telephone Encounter (Signed)
  Care Management   Follow Up Note   06/04/2021 Name: Carolyn Russo MRN: [id number] DOB: 14-Jul-1951   Referred by: [name], MD Reason for referral : Chronic Care Management   An unsuccessful telephone outreach was attempted today. The patient was referred to the case management team for assistance with care management and care coordination. SW left a HIPAA compliant voice message requesting a return call.  Follow Up Plan: The care management team will reach out to the patient again over the next 21 days.   Daneen Schick, BSW, CDP Social Worker, Certified Dementia Practitioner [place] [facility] [phone]

## 2021-06-07 ENCOUNTER — Other Ambulatory Visit (HOSPITAL_COMMUNITY): Payer: Self-pay

## 2021-06-07 ENCOUNTER — Telehealth (HOSPITAL_COMMUNITY): Payer: Self-pay

## 2021-06-07 DIAGNOSIS — E1122 Type 2 diabetes mellitus with diabetic chronic kidney disease: Secondary | ICD-10-CM | POA: Diagnosis not present

## 2021-06-07 DIAGNOSIS — N2581 Secondary hyperparathyroidism of renal origin: Secondary | ICD-10-CM | POA: Diagnosis not present

## 2021-06-07 DIAGNOSIS — Z992 Dependence on renal dialysis: Secondary | ICD-10-CM | POA: Diagnosis not present

## 2021-06-07 DIAGNOSIS — I5033 Acute on chronic diastolic (congestive) heart failure: Secondary | ICD-10-CM | POA: Diagnosis not present

## 2021-06-07 DIAGNOSIS — D631 Anemia in chronic kidney disease: Secondary | ICD-10-CM | POA: Diagnosis not present

## 2021-06-07 DIAGNOSIS — I69354 Hemiplegia and hemiparesis following cerebral infarction affecting left non-dominant side: Secondary | ICD-10-CM | POA: Diagnosis not present

## 2021-06-07 DIAGNOSIS — I132 Hypertensive heart and chronic kidney disease with heart failure and with stage 5 chronic kidney disease, or end stage renal disease: Secondary | ICD-10-CM | POA: Diagnosis not present

## 2021-06-07 DIAGNOSIS — N186 End stage renal disease: Secondary | ICD-10-CM | POA: Diagnosis not present

## 2021-06-07 DIAGNOSIS — D509 Iron deficiency anemia, unspecified: Secondary | ICD-10-CM | POA: Diagnosis not present

## 2021-06-07 NOTE — Telephone Encounter (Signed)
Pharmacy Transitions of Care Follow-up Telephone Call  Date of discharge: 05/27/21  Discharge Diagnosis: CVA  How have you been since you were released from the hospital?  Spoke to patient's husband on the phone. Patient has been doing well since discharge. Reconfirmed med changes for bowel regimen.  Medication changes made at discharge:      START taking: (feeding supplement) PROSource Plus  Aspirin Low Dose (aspirin)  Basaglar KwikPen  BD Pen Needle Nano U/F (Insulin Pen Needle)  diclofenac Sodium (VOLTAREN)  Dulera (mometasone-formoterol)  hydrALAZINE (APRESOLINE)  melatonin  methylphenidate (RITALIN)  polyethylene glycol powder (GLYCOLAX/MIRALAX)  traZODone (DESYREL)  CHANGE how you take: albuterol (VENTOLIN HFA)  STOP taking: acetaminophen 500 MG tablet (TYLENOL)  FDGARD PO  sevelamer carbonate 800 MG tablet (RENVELA)  Symbicort 160-4.5 MCG/ACT inhaler (budesonide-formoterol)   Medication changes verified by the patient? yes  Medication Accessibility:  Home Pharmacy: not discussed   Was the patient provided with refills on discharged medications? Only 3 partial refills remain, patient is in inpatient rehab   Have all prescriptions been transferred from [facility] to home pharmacy?  No  Is the patient able to afford medications? Patient has Advance insurance    Medication Review:  CLOPIDOGREL (PLAVIX) Clopidogrel 75 mg once daily.  - Educated patient on expected duration of therapy of ASA 81 mg with clopidogrel - Advised patient of medications to avoid (NSAIDs, ASA)  - Educated that Tylenol (acetaminophen) will be the preferred analgesic to prevent risk of bleeding  - Emphasized importance of monitoring for signs and symptoms of bleeding (abnormal bruising, prolonged bleeding, nose bleeds, bleeding from gums, discolored urine, black tarry stools)  - Advised patient to alert all providers of anticoagulation therapy prior to starting a new medication or having a procedure     Follow-up Appointments:  PCP Hospital f/u appt confirmed? Scheduled to see Dr. Laurance Flatten on 06/14/21 @ Internal Med.   [facility] f/u appt confirmed? Scheduled to see Dr. Darden Dates on 06/21/21 @ Neurology.   If their condition worsens, is the pt aware to call PCP or go to the Emergency Dept.? Yes  Final Patient Assessment: Patient has follow up scheduled, knows to get refills at next follow up

## 2021-06-09 ENCOUNTER — Encounter: Payer: Self-pay | Admitting: Physician Assistant

## 2021-06-09 ENCOUNTER — Ambulatory Visit (INDEPENDENT_AMBULATORY_CARE_PROVIDER_SITE_OTHER): Payer: Medicare Other | Admitting: Physician Assistant

## 2021-06-09 ENCOUNTER — Telehealth: Payer: Self-pay | Admitting: Vascular Surgery

## 2021-06-09 ENCOUNTER — Ambulatory Visit (HOSPITAL_COMMUNITY)
Admission: RE | Admit: 2021-06-09 | Discharge: 2021-06-09 | Disposition: A | Discharge status: Home / Self Care | Payer: Medicare Other | Source: Ambulatory Visit | Attending: Vascular Surgery | Admitting: Vascular Surgery

## 2021-06-09 ENCOUNTER — Other Ambulatory Visit: Payer: Self-pay

## 2021-06-09 VITALS — BP 137/68 | HR 74

## 2021-06-09 DIAGNOSIS — N2581 Secondary hyperparathyroidism of renal origin: Secondary | ICD-10-CM | POA: Diagnosis not present

## 2021-06-09 DIAGNOSIS — N185 Chronic kidney disease, stage 5: Secondary | ICD-10-CM | POA: Insufficient documentation

## 2021-06-09 DIAGNOSIS — D509 Iron deficiency anemia, unspecified: Secondary | ICD-10-CM | POA: Diagnosis not present

## 2021-06-09 DIAGNOSIS — N186 End stage renal disease: Secondary | ICD-10-CM | POA: Diagnosis not present

## 2021-06-09 DIAGNOSIS — D631 Anemia in chronic kidney disease: Secondary | ICD-10-CM | POA: Diagnosis not present

## 2021-06-09 DIAGNOSIS — Z992 Dependence on renal dialysis: Secondary | ICD-10-CM

## 2021-06-09 NOTE — Telephone Encounter (Signed)
Dr. Levell July office does not do the type of PT that the patient needs. Put another referral in for [facility] facility.  Tried to call all three of patient's  listed phone numbers and mail boxes were all full.

## 2021-06-09 NOTE — Progress Notes (Signed)
POST OPERATIVE OFFICE NOTE    CC:  F/u for surgery  HPI:  This is a 70 y.o. female who is s/p right UE first stage basilic fistula creation. on 04/30/21 by Dr. Scot Dock.  Post op she had weakness in the right UE.  She did get a block prior to surgery.  She also recent history of stroke with left sided weakness.     Pt states Her right hand always feels cold and she is very weak with her grip and fine motor skills.  Dr. Scot Dock saw her in Rehab at [facility] and advised her to have the fistula legated.  She and her family wanted to give the fistula more time to recover and gain her strength.  She had good arterial flow to the hand including radial pulse, ulnar doppler and palmer signals.  Pt returns today for follow up. She is currently on HD via [facility].    Allergies  Allergen Reactions   Food Anaphylaxis    Peanuts; Almonds   Other Shortness Of Breath    Peanuts; Almonds   Wheat Bran Shortness Of Breath   Statins Itching and Other (See Comments)    Generalized aches   Iodine Other (See Comments)    The patient denied this allergy to me on 03/16/2021   Shellfish Allergy Other (See Comments)    Mouth gets raw   Sitagliptin     Other reaction(s): Unknown   Tetracycline Other (See Comments)    Raw mouth   Tetracyclines & Related Itching   Contrast Media [Iodinated Diagnostic Agents] Rash    Happened during CT scan over 30 years ago    Current Outpatient Medications  Medication Sig Dispense Refill   albuterol (VENTOLIN HFA) 108 (90 Base) MCG/ACT inhaler Inhale 2 puffs into the lungs every 4 (four) hours as needed for wheezing or shortness of breath. 18 g 3   aspirin 81 MG EC tablet Take 1 tablet (81 mg total) by mouth daily. Swallow whole. 100 tablet 0   b complex vitamins capsule Take 1 capsule by mouth daily.     BESIVANCE 0.6 % SUSP Place 1 drop into both eyes See admin instructions. After  eye injections     carvedilol (COREG) 3.125 MG tablet Take 1 tablet (3.125 mg total) by mouth 2 (two)  times daily with a meal. 60 tablet 0   Cholecalciferol (VITAMIN D3 PO) Take 2,000 Units by mouth daily.     clopidogrel (PLAVIX) 75 MG tablet Take 1 tablet (75 mg total) by mouth daily. 30 tablet 0   Continuous Blood Gluc Receiver (DEXCOM G6 RECEIVER) DEVI Use as directed to check blood sugars 3 times per day dx: e11.65 1 each 1   Continuous Blood Gluc Sensor (DEXCOM G6 SENSOR) MISC Use as directed to check blood sugars 3 times per day dx: e11.65 3 each 2   Continuous Blood Gluc Transmit (DEXCOM G6 TRANSMITTER) MISC Use as directed to check blood sugars 3 times per day dx: e11.65 1 each 1   diclofenac Sodium (VOLTAREN) 1 % GEL Apply 2 g topically 4 (four) times daily. To left wrist 150 g 0   fluticasone (FLONASE) 50 MCG/ACT nasal spray Place 2 sprays into the nose daily as needed for allergies.      hydrALAZINE (APRESOLINE) 10 MG tablet Take 1 tablet (10 mg total) by mouth every 8 (eight) hours as needed (Sys BP >180, hold on HD days). 90 tablet 0   hydrocortisone (ANUSOL-HC) 2.5 % rectal cream Place 1  application rectally 2 (two) times daily as needed for hemorrhoids or anal itching. 30 g 1   insulin glargine (LANTUS) 100 UNIT/ML Solostar Pen Inject 13 Units into the skin daily. 15 mL 0   Insulin Pen Needle (BD PEN NEEDLE NANO U/F) 32G X 4 MM MISC Use daily as directed. 100 each 0   levETIRAcetam (KEPPRA) 500 MG tablet Take 1 tablet (500 mg total) by mouth at bedtime. 30 tablet 0   melatonin 3 MG TABS tablet Take 1 tablet (3 mg total) by mouth at bedtime. 30 tablet 0   methylphenidate (RITALIN) 5 MG tablet Take 1 tablet (5 mg total) by mouth 2 (two) times daily with breakfast and lunch. 60 tablet 0   mometasone-formoterol (DULERA) 200-5 MCG/ACT AERO Inhale 2 puffs into the lungs 2 (two) times daily. 13 g 0   Nutritional Supplements (,FEEDING SUPPLEMENT, PROSOURCE PLUS) liquid Take 30 mLs by mouth 2 (two) times daily between meals. 887 mL 0   ondansetron (ZOFRAN) 4 MG tablet Take 1 tablet (4 mg  total) by mouth every 6 (six) hours as needed for nausea or vomiting. 30 tablet 0   OneTouch Delica Lancets 21H MISC Use as directed to check blood sugars before breakfast and dinner dx: e11.65 100 each 11   pantoprazole (PROTONIX) 40 MG tablet Take 1 tablet (40 mg total) by mouth daily. 30 tablet 0   polyethylene glycol powder (GLYCOLAX/MIRALAX) 17 GM/SCOOP powder Take 17 g by mouth daily. 510 g 0   rosuvastatin (CRESTOR) 20 MG tablet Take 1 tablet (20 mg total) by mouth daily. 30 tablet 0   senna-docusate (SENOKOT-S) 8.6-50 MG tablet Take 2 tablets by mouth 2 (two) times daily. 120 tablet 0   traZODone (DESYREL) 100 MG tablet Take 1 tablet (100 mg total) by mouth at bedtime. 30 tablet 0   vitamin C (ASCORBIC ACID) 500 MG tablet Take 500 mg by mouth daily.     No current facility-administered medications for this visit.     ROS:  See HPI  Physical Exam:  Findings:  +--------------------+----------+-----------------+--------+  AVF                 PSV (cm/s)Flow Vol (mL/min)Comments  +--------------------+----------+-----------------+--------+  Native artery inflow   263           971                 +--------------------+----------+-----------------+--------+  AVF Anastomosis        536                               +--------------------+----------+-----------------+--------+      +------------+----------+-------------+----------+--------+  OUTFLOW VEINPSV (cm/s)Diameter (cm)Depth (cm)Describe  +------------+----------+-------------+----------+--------+  Prox UA        156        0.66        2.63             +------------+----------+-------------+----------+--------+  Mid UA         249        0.56        2.49             +------------+----------+-------------+----------+--------+  Dist UA        512        0.50        1.44             +------------+----------+-------------+----------+--------+  AC Fossa       476  0.42        0.93              +------------+----------+-------------+----------+--------+      Summary:  Patent arteriovenous fistula.   Incision:  well healed AC incision  Extremities:  Right UE weakness grip 3-/5, inability to fully extend her fingers.  Fingers cool to touch.  Palpable radial pulses, brisk ulnar and palmer doppler signals.  Neuro: right hand decreased sensation.      Assessment/Plan:  This is a 70 y.o. female who is s/p: First stage Basilic fistula creation with steal symptoms of weakness and decreased sensation of the right hand.  She has good arterial inflow.  Dr. Scot Dock reviewed the symptoms of steal and examined the patient today.  He has offered ligation of the fistula and then planning of new access in the future.  The patient and her family want to discuss their options and they will call if they want to proceed with fistula ligation.    The fistula is maturing well on duplex today.     In the mean time I will order OP hand therapy PT/OT to assist her with function.  I gave her a stress ball to use as well.   F/U PRN.     Roxy Horseman PA-C [facility] [phone]   Clinic MD:  Scot Dock

## 2021-06-10 ENCOUNTER — Telehealth: Payer: Medicare Other

## 2021-06-10 ENCOUNTER — Telehealth: Payer: Self-pay | Admitting: *Deleted

## 2021-06-10 DIAGNOSIS — E1122 Type 2 diabetes mellitus with diabetic chronic kidney disease: Secondary | ICD-10-CM | POA: Diagnosis not present

## 2021-06-10 DIAGNOSIS — I5033 Acute on chronic diastolic (congestive) heart failure: Secondary | ICD-10-CM | POA: Diagnosis not present

## 2021-06-10 DIAGNOSIS — D631 Anemia in chronic kidney disease: Secondary | ICD-10-CM | POA: Diagnosis not present

## 2021-06-10 DIAGNOSIS — I132 Hypertensive heart and chronic kidney disease with heart failure and with stage 5 chronic kidney disease, or end stage renal disease: Secondary | ICD-10-CM | POA: Diagnosis not present

## 2021-06-10 DIAGNOSIS — N186 End stage renal disease: Secondary | ICD-10-CM | POA: Diagnosis not present

## 2021-06-10 DIAGNOSIS — I69354 Hemiplegia and hemiparesis following cerebral infarction affecting left non-dominant side: Secondary | ICD-10-CM | POA: Diagnosis not present

## 2021-06-10 NOTE — Chronic Care Management (AMB) (Signed)
  Care Management   Note  06/10/2021 Name: KHRYSTINA BONNES MRN: [id number] DOB: 1951-10-22  Numa F Varone is a 70 y.o. year old female who is a primary care patient of [name], MD and is actively engaged with the care management team. I reached out to Lyndee Hensen by phone today to assist with re-scheduling an initial visit with the Pharmacist  Follow up plan: Unsuccessful telephone outreach attempt made. The care management team will reach out to the patient again over the next 7 days. If patient returns call to provider office, please advise to call [place] at [phone].  [facility]  Direct Dial: [phone]

## 2021-06-11 DIAGNOSIS — D631 Anemia in chronic kidney disease: Secondary | ICD-10-CM | POA: Diagnosis not present

## 2021-06-11 DIAGNOSIS — N2581 Secondary hyperparathyroidism of renal origin: Secondary | ICD-10-CM | POA: Diagnosis not present

## 2021-06-11 DIAGNOSIS — N186 End stage renal disease: Secondary | ICD-10-CM | POA: Diagnosis not present

## 2021-06-11 DIAGNOSIS — Z992 Dependence on renal dialysis: Secondary | ICD-10-CM | POA: Diagnosis not present

## 2021-06-11 DIAGNOSIS — D509 Iron deficiency anemia, unspecified: Secondary | ICD-10-CM | POA: Diagnosis not present

## 2021-06-14 ENCOUNTER — Encounter: Payer: Self-pay | Admitting: Nurse Practitioner

## 2021-06-14 ENCOUNTER — Ambulatory Visit (INDEPENDENT_AMBULATORY_CARE_PROVIDER_SITE_OTHER): Payer: Medicare Other | Admitting: Nurse Practitioner

## 2021-06-14 ENCOUNTER — Other Ambulatory Visit: Payer: Self-pay

## 2021-06-14 VITALS — BP 134/78 | HR 84 | Temp 98.6°F

## 2021-06-14 DIAGNOSIS — D509 Iron deficiency anemia, unspecified: Secondary | ICD-10-CM | POA: Diagnosis not present

## 2021-06-14 DIAGNOSIS — K649 Unspecified hemorrhoids: Secondary | ICD-10-CM

## 2021-06-14 DIAGNOSIS — R110 Nausea: Secondary | ICD-10-CM

## 2021-06-14 DIAGNOSIS — N2581 Secondary hyperparathyroidism of renal origin: Secondary | ICD-10-CM | POA: Diagnosis not present

## 2021-06-14 DIAGNOSIS — Z992 Dependence on renal dialysis: Secondary | ICD-10-CM

## 2021-06-14 DIAGNOSIS — N186 End stage renal disease: Secondary | ICD-10-CM

## 2021-06-14 DIAGNOSIS — I69359 Hemiplegia and hemiparesis following cerebral infarction affecting unspecified side: Secondary | ICD-10-CM

## 2021-06-14 DIAGNOSIS — I6381 Other cerebral infarction due to occlusion or stenosis of small artery: Secondary | ICD-10-CM

## 2021-06-14 DIAGNOSIS — D631 Anemia in chronic kidney disease: Secondary | ICD-10-CM | POA: Diagnosis not present

## 2021-06-14 DIAGNOSIS — R29898 Other symptoms and signs involving the musculoskeletal system: Secondary | ICD-10-CM

## 2021-06-14 DIAGNOSIS — G4709 Other insomnia: Secondary | ICD-10-CM | POA: Diagnosis not present

## 2021-06-14 MED ORDER — ONDANSETRON HCL 4 MG PO TABS: 4.0000 mg | ORAL_TABLET | Freq: Four times a day (QID) | ORAL | 0 refills | Status: DC | PRN

## 2021-06-14 MED ORDER — HYDROXYZINE HCL 25 MG PO TABS: 25.0000 mg | ORAL_TABLET | Freq: Every evening | ORAL | 2 refills | Status: DC | PRN

## 2021-06-14 MED ORDER — PHENYLEPHRINE IN HARD FAT 0.25 % RE SUPP
1.0000 | Freq: Two times a day (BID) | RECTAL | 0 refills | Status: DC
Start: 2021-06-14 — End: 2022-04-14

## 2021-06-14 MED ORDER — HYDROCORTISONE 1 % EX CREA: TOPICAL_CREAM | CUTANEOUS | 1 refills | Status: DC

## 2021-06-14 NOTE — Progress Notes (Signed)
I,Yamilka Roman [employer] as a Education administrator for [employer], FNP.,have documented all relevant documentation on the behalf of Minette Brine, FNP,as directed by  Minette Brine, FNP while in the presence of Minette Brine, [place].   This visit occurred during the SARS-CoV-2 public health emergency.  Safety protocols were in place, including screening questions prior to the visit, additional usage of staff PPE, and extensive cleaning of exam room while observing appropriate contact time as indicated for disinfecting solutions.  Subjective:     Patient ID: Carolyn Russo , female    DOB: 1951-07-02 , 70 y.o.   MRN: [id number]   Chief Complaint  Patient presents with   hospital f/u    HPI  Here for hospital follow up after having a CVA was in Rehab as well., was admitted from 7/15-8/4.  She has PT. No OT at this time.  She does not have a [place]. Has a private duty nurse self pay. CNA 1-2 times a week for bathing. Se is using [employer].    According to her hospital notes she is a newly diagnosed end-stage renal disease with hemodialysis diabetes mellitus CAD maintained on aspirin iron deficiency anemia and asthma.  She had multiple hospitalizations as below: Patient with latest admission 02/1719 22-6 11/12/2020 for acute on chronic diastolic congestive heart failure exacerbation.  Per chart review lives with spouse.  Two-level home.  Independent with a cane prior to admission.  Presented 04/16/2021 with left-sided weakness x3 days and slurred speech with bouts of vomiting.  It was noted patient had initially presented to the ER couple of days prior to the latest admission with complaints of left-sided weakness but left without being formally evaluated.  Cranial CT scan showed no acute abnormalities.  Patient did not receive tPA.  MRI of the brain showed a 2.1 cm acute ischemic nonhemorrhagic right basal ganglia infarction.  MRA angiogram of head and neck grossly negative no large vessel occlusion or high-grade  stenosis.  Most recent echocardiogram with ejection fraction of 54% grade 2 diastolic dysfunction.  She had a low hgb of 8.8-9.0 creatinine 3.58.  She was seen by GI with a consult on 04/18/2021 after patient had multiple large bloody bowel movements with clots. She had an upper endoscopy 04/18/2021 showing normal esophagus and stomach and the cardia and gastric fundus normal on retroflexion and also underwent flexible sigmoidoscopy showing hemorrhoids found on perianal exam external and internal.    She had initially been placed on aspirin and Plavix for CVA prophylaxis held due to suspected lower GI bleed awaiting plan on resuming anticoagulation.  She was sent to Rehab as well post discharge due to decreased mobility.    She has a fistula placed to her right arm and paralyzed, now the bad side is better than the right. She is having difficulty with feeding herself. The fistula is not being used. She is using vascath on right on MWF.  Has not had any falls since being home. She is using the walker and wheelchair. Denies any trouble swallowing.   She is scheduled to see Neurology 8/29.  She is no longer able to use her right arm.    Her son will come to visit when possible. No issues with urination at this time.  She has just started on dialysis has had to stop early several times due to fatigue, nausea.  When at the hospital after a dialysis treatment had water shift and looked like she had another stroke which was level.  She  had a rough fistula insertion. Blood pressure and heart rate dropped. She had a stroke after this procedure and when had fistula started  She is also not wanting to have another surgery at this time. She is to call vascular back when she is ready.   She is to take lasix on non dialysis days. Have stopped the lasix after the last time.  Does she need to be back on the lasix. She is also taking melatonin for sleep.  She also has trazodone but has not taken     Past Medical  History:  Diagnosis Date   Acute GI bleeding    Allergy    Anemia    Anterior chest wall pain    Appendicitis 1965   Asthma    Body mass index 37.0-37.9, adult    Breast pain    Cataract    both eyes   CHF (congestive heart failure) ([place])    Chronic kidney disease    stage 5 - on dialysis   Cognitive change 04/20/2021   r/t cva 03/2821   Dehydration 2014   Deviated septum 1971   Diabetes mellitus    no meds   Dyspnea 2014   Extrinsic asthma    WITH ASTHMA ATTACK   Fibroid 1980   GERD (gastroesophageal reflux disease)    Heart murmur    Hx gestational diabetes    Hyperlipidemia    Hypertension 2014   Inguinal hernia 1959   Malaise and fatigue 2014   Non-IgE mediated allergic asthma 2014   Obesity    Pelvic pain    Pregnancy, high-risk 1985   Stroke ([place]) 04/20/2021   (CVA) of right basal ganglia   Tonsillitis 1968   Uterine fibroid 1980     Family History  Problem Relation Age of Onset   Cancer Mother        abdominal melamona   Psoriasis Mother    Alzheimer's disease Father    Cancer Cousin        colon    Diabetes Cousin    Diabetes Maternal Aunt    Colon cancer Neg Hx    Colon polyps Neg Hx    Esophageal cancer Neg Hx    Rectal cancer Neg Hx    Stomach cancer Neg Hx    Breast cancer Neg Hx      Current Outpatient Medications:    albuterol (VENTOLIN HFA) 108 (90 Base) MCG/ACT inhaler, Inhale 2 puffs into the lungs every 4 (four) hours as needed for wheezing or shortness of breath., Disp: 18 g, Rfl: 3   BESIVANCE 0.6 % SUSP, Place 1 drop into both eyes See admin instructions. After  eye injections, Disp: , Rfl:    clopidogrel (PLAVIX) 75 MG tablet, Take 1 tablet (75 mg total) by mouth daily., Disp: 30 tablet, Rfl: 0   diclofenac Sodium (VOLTAREN) 1 % GEL, Apply 2 g topically 4 (four) times daily. To left wrist, Disp: 150 g, Rfl: 0   fluticasone (FLONASE) 50 MCG/ACT nasal spray, Place 2 sprays into the nose daily as needed for allergies. , Disp: , Rfl:     hydrALAZINE (APRESOLINE) 10 MG tablet, Take 1 tablet (10 mg total) by mouth every 8 (eight) hours as needed (Sys BP >180, hold on HD days)., Disp: 90 tablet, Rfl: 0   hydrocortisone (ANUSOL-HC) 2.5 % rectal cream, Place 1 application rectally 2 (two) times daily as needed for hemorrhoids or anal itching., Disp: 30 g, Rfl: 1   hydrocortisone cream 1 %,  Apply to affected area 2 times daily, Disp: 30 g, Rfl: 1   hydrOXYzine (ATARAX/VISTARIL) 25 MG tablet, Take 1 tablet (25 mg total) by mouth at bedtime as needed., Disp: 30 tablet, Rfl: 2   Insulin Pen Needle (BD PEN NEEDLE NANO U/F) 32G X 4 MM MISC, Use daily as directed., Disp: 100 each, Rfl: 0   melatonin 3 MG TABS tablet, Take 1 tablet (3 mg total) by mouth at bedtime., Disp: 30 tablet, Rfl: 0   Nutritional Supplements (,FEEDING SUPPLEMENT, PROSOURCE PLUS) liquid, Take 30 mLs by mouth 2 (two) times daily between meals., Disp: 887 mL, Rfl: 0   OneTouch Delica Lancets 54Y MISC, Use as directed to check blood sugars before breakfast and dinner dx: e11.65, Disp: 100 each, Rfl: 11   phenylephrine (,USE FOR PREPARATION-H,) 0.25 % suppository, Place 1 suppository rectally 2 (two) times daily., Disp: 12 suppository, Rfl: 0   polyethylene glycol powder (GLYCOLAX/MIRALAX) 17 GM/SCOOP powder, Take 17 g by mouth daily., Disp: 510 g, Rfl: 0   carvedilol (COREG) 3.125 MG tablet, Take 1 tablet (3.125 mg total) by mouth 2 (two) times daily with a meal., Disp: 60 tablet, Rfl: 0   Continuous Blood Gluc Receiver (FREESTYLE LIBRE 2 READER) DEVI, by Does not apply route., Disp: , Rfl:    diphenhydrAMINE (BENADRYL) 25 MG tablet, Take 25 mg by mouth every 6 (six) hours as needed for itching., Disp: , Rfl:    hydrOXYzine (VISTARIL) 25 MG capsule, Take 25 mg by mouth 3 (three) times daily as needed. Dose unknown, Disp: , Rfl:    insulin glargine (LANTUS) 100 UNIT/ML Solostar Pen, Inject 13 Units into the skin daily., Disp: 15 mL, Rfl: 2   levETIRAcetam (KEPPRA) 500 MG  tablet, Take 1 tablet (500 mg total) by mouth at bedtime., Disp: 90 tablet, Rfl: 1   methylphenidate (RITALIN) 5 MG tablet, Take 1 tablet (5 mg total) by mouth 2 (two) times daily with breakfast and lunch., Disp: 60 tablet, Rfl: 0   mometasone-formoterol (DULERA) 200-5 MCG/ACT AERO, Inhale 2 puffs into the lungs 2 (two) times daily., Disp: 3 each, Rfl: 2   ondansetron (ZOFRAN) 4 MG tablet, Take 1 tablet (4 mg total) by mouth every 6 (six) hours as needed for nausea or vomiting., Disp: 30 tablet, Rfl: 0   pantoprazole (PROTONIX) 40 MG tablet, Take 1 tablet (40 mg total) by mouth daily., Disp: 90 tablet, Rfl: 2   rosuvastatin (CRESTOR) 20 MG tablet, Take 1 tablet (20 mg total) by mouth daily., Disp: 90 tablet, Rfl: 2   senna-docusate (SENOKOT-S) 8.6-50 MG tablet, Take 2 tablets by mouth 2 (two) times daily., Disp: 360 tablet, Rfl: 2   Allergies  Allergen Reactions   Food Anaphylaxis    Peanuts; Almonds   Other Shortness Of Breath    Peanuts; Almonds   Wheat Bran Shortness Of Breath   Statins Itching and Other (See Comments)    Generalized aches   Iodine Other (See Comments)    The patient denied this allergy to me on 03/16/2021   Shellfish Allergy Other (See Comments)    Mouth gets raw   Sitagliptin     Other reaction(s): Unknown   Tetracycline Other (See Comments)    Raw mouth   Tetracyclines & Related Itching   Contrast Media [Iodinated Diagnostic Agents] Rash    Happened during CT scan over 30 years ago     Review of Systems  Constitutional: Negative.   Respiratory: Negative.    Cardiovascular: Negative.  Neurological:  Positive for weakness (left side weakness). Negative for dizziness and headaches.  Psychiatric/Behavioral: Negative.      Today's Vitals   06/14/21 1638  BP: 134/78  Pulse: 84  Temp: 98.6 F (37 C)  SpO2: 97%  PainSc: 10-Worst pain ever  PainLoc: Hand   There is no height or weight on file to calculate BMI.   Objective:  Physical Exam Vitals  reviewed.  Constitutional:      General: She is not in acute distress.    Appearance: Normal appearance.  Cardiovascular:     Rate and Rhythm: Normal rate and regular rhythm.     Pulses: Normal pulses.     Heart sounds: Normal heart sounds. No murmur heard. Pulmonary:     Effort: Pulmonary effort is normal. No respiratory distress.     Breath sounds: Normal breath sounds. No wheezing.  Neurological:     General: No focal deficit present.     Mental Status: She is alert and oriented to person, place, and time.     Cranial Nerves: No cranial nerve deficit.     Motor: Weakness (left upper and lower extremity, she is also having weakness to her right arm where she hs the fistula) present.  Psychiatric:        Behavior: Behavior normal.        Thought Content: Thought content normal.        Judgment: Judgment normal.     Comments: She is flat        Assessment And Plan:     1. Cerebrovascular accident (CVA) of right basal ganglia ([place]) TCM Performed. A member of the clinical team spoke with the patient upon dischare. Discharge summary was reviewed in full detail during the visit. Meds reconciled and compared to discharge meds. Medication list is updated and reviewed with the patient.  Greater than 50% face to face time was spent in counseling an coordination of care.  All questions were answered to the satisfaction of the patient.     2. Hemiparesis affecting nondominant side as late effect of cerebrovascular accident (CVA) ([place]) Comments: Left hemiparesis and is currently doing outpatient rehab.  - Ambulatory referral to [facility]  3. Right arm weakness She is now having problems with her right hand - Ambulatory referral to [facility]  4. Other insomnia - hydrOXYzine (ATARAX/VISTARIL) 25 MG tablet; Take 1 tablet (25 mg total) by mouth at bedtime as needed.  Dispense: 30 tablet; Refill: 2  5. Nausea - ondansetron (ZOFRAN) 4 MG tablet; Take 1 tablet (4 mg total) by mouth every  6 (six) hours as needed for nausea or vomiting.  Dispense: 30 tablet; Refill: 0  6. ESRD (end stage renal disease) ([place])  7. Hemodialysis patient [facility]) She is having some difficulty with her dialysis.   8. Hemorrhoids, unspecified hemorrhoid type - hydrocortisone cream 1 %; Apply to affected area 2 times daily  Dispense: 30 g; Refill: 1 - phenylephrine (,USE FOR PREPARATION-H,) 0.25 % suppository; Place 1 suppository rectally 2 (two) times daily.  Dispense: 12 suppository; Refill: 0    Patient was given opportunity to ask questions. Patient verbalized understanding of the plan and was able to repeat key elements of the plan. All questions were answered to their satisfaction.  Minette Brine, FNP   I, Minette Brine, FNP, have reviewed all documentation for this visit. The documentation on 06/14/21 for the exam, diagnosis, procedures, and orders are all accurate and complete.   IF YOU HAVE BEEN REFERRED  TO A SPECIALIST, IT MAY TAKE 1-2 WEEKS TO SCHEDULE/PROCESS THE REFERRAL. IF YOU HAVE NOT HEARD FROM US/SPECIALIST IN TWO WEEKS, PLEASE GIVE [place] A CALL AT [phone] X [phone].   THE PATIENT IS ENCOURAGED TO PRACTICE SOCIAL DISTANCING DUE TO THE COVID-19 PANDEMIC.

## 2021-06-15 ENCOUNTER — Telehealth: Payer: Medicare Other

## 2021-06-15 DIAGNOSIS — I69354 Hemiplegia and hemiparesis following cerebral infarction affecting left non-dominant side: Secondary | ICD-10-CM | POA: Diagnosis not present

## 2021-06-15 DIAGNOSIS — I132 Hypertensive heart and chronic kidney disease with heart failure and with stage 5 chronic kidney disease, or end stage renal disease: Secondary | ICD-10-CM | POA: Diagnosis not present

## 2021-06-15 DIAGNOSIS — E1122 Type 2 diabetes mellitus with diabetic chronic kidney disease: Secondary | ICD-10-CM | POA: Diagnosis not present

## 2021-06-15 DIAGNOSIS — N186 End stage renal disease: Secondary | ICD-10-CM | POA: Diagnosis not present

## 2021-06-15 DIAGNOSIS — D631 Anemia in chronic kidney disease: Secondary | ICD-10-CM | POA: Diagnosis not present

## 2021-06-15 DIAGNOSIS — I5033 Acute on chronic diastolic (congestive) heart failure: Secondary | ICD-10-CM | POA: Diagnosis not present

## 2021-06-16 DIAGNOSIS — N2581 Secondary hyperparathyroidism of renal origin: Secondary | ICD-10-CM | POA: Diagnosis not present

## 2021-06-16 DIAGNOSIS — D631 Anemia in chronic kidney disease: Secondary | ICD-10-CM | POA: Diagnosis not present

## 2021-06-16 DIAGNOSIS — N186 End stage renal disease: Secondary | ICD-10-CM | POA: Diagnosis not present

## 2021-06-16 DIAGNOSIS — D509 Iron deficiency anemia, unspecified: Secondary | ICD-10-CM | POA: Diagnosis not present

## 2021-06-16 DIAGNOSIS — Z992 Dependence on renal dialysis: Secondary | ICD-10-CM | POA: Diagnosis not present

## 2021-06-17 DIAGNOSIS — E1122 Type 2 diabetes mellitus with diabetic chronic kidney disease: Secondary | ICD-10-CM | POA: Diagnosis not present

## 2021-06-17 DIAGNOSIS — N186 End stage renal disease: Secondary | ICD-10-CM | POA: Diagnosis not present

## 2021-06-17 DIAGNOSIS — I5033 Acute on chronic diastolic (congestive) heart failure: Secondary | ICD-10-CM | POA: Diagnosis not present

## 2021-06-17 DIAGNOSIS — I69354 Hemiplegia and hemiparesis following cerebral infarction affecting left non-dominant side: Secondary | ICD-10-CM | POA: Diagnosis not present

## 2021-06-17 DIAGNOSIS — I132 Hypertensive heart and chronic kidney disease with heart failure and with stage 5 chronic kidney disease, or end stage renal disease: Secondary | ICD-10-CM | POA: Diagnosis not present

## 2021-06-17 DIAGNOSIS — D631 Anemia in chronic kidney disease: Secondary | ICD-10-CM | POA: Diagnosis not present

## 2021-06-17 NOTE — Chronic Care Management (AMB) (Signed)
  Care Management   Note  06/17/2021 Name: Carolyn Russo MRN: [id number] DOB: 02/15/1951  Carolyn Russo is a 70 y.o. year old female who is a primary care patient of [name], MD and is actively engaged with the care management team. I reached out to Lyndee Hensen by phone today to assist with re-scheduling an initial visit with the Pharmacist  Follow up plan: Unsuccessful telephone outreach attempt made. A HIPAA compliant phone message was left for the patient providing contact information and requesting a return call.  The care management team will reach out to the patient again over the next 7 days.  If patient returns call to provider office, please advise to call [place] at [phone].  [facility] Management  Direct Dial: [phone]

## 2021-06-18 DIAGNOSIS — Z992 Dependence on renal dialysis: Secondary | ICD-10-CM | POA: Diagnosis not present

## 2021-06-18 DIAGNOSIS — N186 End stage renal disease: Secondary | ICD-10-CM | POA: Diagnosis not present

## 2021-06-18 DIAGNOSIS — D509 Iron deficiency anemia, unspecified: Secondary | ICD-10-CM | POA: Diagnosis not present

## 2021-06-18 DIAGNOSIS — N2581 Secondary hyperparathyroidism of renal origin: Secondary | ICD-10-CM | POA: Diagnosis not present

## 2021-06-18 DIAGNOSIS — D631 Anemia in chronic kidney disease: Secondary | ICD-10-CM | POA: Diagnosis not present

## 2021-06-21 ENCOUNTER — Ambulatory Visit (INDEPENDENT_AMBULATORY_CARE_PROVIDER_SITE_OTHER): Payer: Medicare Other | Admitting: Adult Health

## 2021-06-21 ENCOUNTER — Encounter: Payer: Self-pay | Admitting: Adult Health

## 2021-06-21 VITALS — BP 144/80 | HR 82

## 2021-06-21 DIAGNOSIS — N2581 Secondary hyperparathyroidism of renal origin: Secondary | ICD-10-CM | POA: Diagnosis not present

## 2021-06-21 DIAGNOSIS — Z992 Dependence on renal dialysis: Secondary | ICD-10-CM | POA: Diagnosis not present

## 2021-06-21 DIAGNOSIS — R569 Unspecified convulsions: Secondary | ICD-10-CM

## 2021-06-21 DIAGNOSIS — I69398 Other sequelae of cerebral infarction: Secondary | ICD-10-CM

## 2021-06-21 DIAGNOSIS — D509 Iron deficiency anemia, unspecified: Secondary | ICD-10-CM | POA: Diagnosis not present

## 2021-06-21 DIAGNOSIS — I639 Cerebral infarction, unspecified: Secondary | ICD-10-CM

## 2021-06-21 DIAGNOSIS — I69354 Hemiplegia and hemiparesis following cerebral infarction affecting left non-dominant side: Secondary | ICD-10-CM

## 2021-06-21 DIAGNOSIS — N186 End stage renal disease: Secondary | ICD-10-CM | POA: Diagnosis not present

## 2021-06-21 DIAGNOSIS — D631 Anemia in chronic kidney disease: Secondary | ICD-10-CM | POA: Diagnosis not present

## 2021-06-21 NOTE — Progress Notes (Signed)
[facility] [address] [place]. [place] [address] [phone]       HOSPITAL FOLLOW UP NOTE  [name] Date of Birth:  [birth year] Medical Record Number:  [id number]   Reason for Referral:  hospital stroke follow up    SUBJECTIVE:   CHIEF COMPLAINT:  Chief Complaint  Patient presents with   Cerebrovascular Accident    Rm [place], husband -[name] "receiving PT/OT at home"     HPI:   [name] is a 70 y.o. female with PMHx of obesity, DM, HTN, HLD, ESRD on HD who presented with left sided weakness for 3 days on 04/16/2021. Personally reviewed hospitalization pertinent progress notes, lab work and imaging. Evaluated by Dr. Erlinda Hong for subcortical infarct on the right, likely secondary to small vessel disease. LDL 70. A1c 5.2. MRA head/neck unremarkable. EF 50% on 02/2021. on aspirin PTA and recommended DAPT for 3 weeks then Plavix alone. Continue Coreg, Crestor 20mg  daily and Novolog. Other stroke risk factors include advanced age and obesity. Other active problems include ESRD on HD, chronic hip pain, asthma and GERD. Residual deficits of left hemiparesis. Eval by therapies recommended CIR and dc'd to CIR on 04/20/2021. Initally recovering well, however, on 7/13 she was found to have AMS, worsening left-sided weakness, left hemineglect and right gaze deviation after HD session. MR brain and CT head no evidence of acute infarct.  EEG showed right central parietal region spikes, consistent with potential epileptogenic focus. Started on Keppra 500mg  nightly for likely seizure with postictal state. Underwent right basilic vein fistula placement by vascular surgery on 7/8 and subsequently developed right hand numbness and weakness.  Evaluated by neurology Dr. 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 VVS recommended ligation of fistula but pt declined. She returned back to CIR on on 7/15 and dc'd home on 8/4.  Today,  06/21/2021, Carolyn Russo is being seen for hospital follow-up accompanied by her husband, [name].  Overall doing well.  Reports improvement of left-sided weakness working with [facility] PT and just started OT last week. She does c/o left shoulder pain with limited ROM. Ambulates short distance with RW. Does have aide assistance for ADLs.  Greatest concern today is in regards to right hand weakness s/p fistula placement. Seen by VVS 8/17 again offering medication of the fistula and then planning new access in the future but she remains hesitant at this time to undergo any further surgical interventions. Recommended hand therapy for possible improvement.  Denies new stroke/TIA symptoms.  Remains on aspirin and Plavix as well as Crestor without side effects.  Blood pressure today 144/80. Remains on keppra 500mg  nightly without seizure activity and tolerating without side effects.  Continued HD thrice weekly.  No further concerns at this time.     Pertinent imaging  MR BRAIN 04/16/2021 IMPRESSION: 1. 2.1 cm acute ischemic nonhemorrhagic right basal ganglia infarct. 2. Underlying moderate chronic microvascular ischemic disease.  MR ANGIO HEAD WO CONTRAST MR ANGIO NECK WO CONTRAST 04/17/2021 IMPRESSION: 1. Technically limited exam due to extensive motion artifact. 2. Grossly negative MRA of the head and neck. No large vessel occlusion. No proximal high-grade or correctable stenosis.  CT HEAD 05/03/2021 IMPRESSION: No acute CT finding. Subacute infarction in the right lateral thalamus/radiating white matter tracts is more clearly visible but there is no sign of extension or hemorrhage. Chronic small-vessel ischemic changes elsewhere affecting the cerebral hemispheric white matter.  CT HEAD 05/05/2021 IMPRESSION: 1. No acute intracranial  pathology. 2. Age-related atrophy and chronic microvascular ischemic changes. Similar appearance of focal area of old infarct lateral to the right thalamus.  CTA  HEAD/NECK 05/05/2021 CT PERFUSION IMPRESSION: 1. Negative CTA for emergent large vessel occlusion. 2. Mild atheromatous change about the major arterial vasculature of the head and neck as above. No proximal high-grade or correctable stenosis. 3. 2 mm focal outpouching arising from the proximal cavernous right ICA, which could reflect a small aneurysm versus vascular infundibulum. Attention at follow-up recommended. 4. CT perfusion portion of this exam unfortunately failed due to technical difficulty. No perfusion maps were generated.  MR BRAIN 05/06/2021 IMPRESSION: No acute infarction. Evolving prior infarction of the right corona radiata and basal ganglia. Chronic microvascular ischemic changes.  EEG 05/06/2021 IMPRESSION: This study showed evidence of potential epileptogenicity arising from right centro-parietal region. There is also cortical dysfunction arising from right hemisphere likely secondary to underlying stroke. Additionally, there is mild diffuse encephalopathy, nonspecific etiology. No seizures were seen throughout the recording.      ROS:   14 system review of systems performed and negative with exception of those listed in HPI  PMH:  Past Medical History:  Diagnosis Date   Acute GI bleeding    Allergy    Anemia    Anterior chest wall pain    Appendicitis 1965   Asthma    Body mass index 37.0-37.9, adult    Breast pain    Cataract    both eyes   CHF (congestive heart failure) ([place])    Chronic kidney disease    stage 5 - on dialysis   Cognitive change 04/20/2021   r/t cva 03/2821   Dehydration 2014   Deviated septum 1971   Diabetes mellitus    no meds   Dyspnea 2014   Extrinsic asthma    WITH ASTHMA ATTACK   Fibroid 1980   GERD (gastroesophageal reflux disease)    Heart murmur    Hx gestational diabetes    Hyperlipidemia    Hypertension 2014   Inguinal hernia 1959   Malaise and fatigue 2014   Non-IgE mediated allergic asthma 2014   Obesity     Pelvic pain    Pregnancy, high-risk 1985   Stroke ([place]) 04/20/2021   (CVA) of right basal ganglia   Tonsillitis 1968   Uterine fibroid 1980    PSH:  Past Surgical History:  Procedure Laterality Date   APPENDECTOMY  0630   [place] Right 04/30/2021   Procedure: RIGHT FIRST STAGE [place];  Surgeon: Angelia Mould, MD;  Location: [facility] OR;  Service: Vascular;  Laterality: Right;   CESAREAN SECTION  1985   COLONOSCOPY     ESOPHAGOGASTRODUODENOSCOPY (EGD) WITH PROPOFOL N/A 04/18/2021   Procedure: ESOPHAGOGASTRODUODENOSCOPY (EGD) WITH PROPOFOL;  Surgeon: Gatha Mayer, MD;  Location: [facility] ENDOSCOPY;  Service: Endoscopy;  Laterality: N/A;   EYE SURGERY     bilateral cataract    FLEXIBLE SIGMOIDOSCOPY N/A 04/18/2021   Procedure: FLEXIBLE SIGMOIDOSCOPY;  Surgeon: Gatha Mayer, MD;  Location: [facility] ENDOSCOPY;  Service: Endoscopy;  Laterality: N/A;   HERNIA REPAIR  1959   IR FLUORO GUIDE CV LINE RIGHT  03/18/2021   IR US GUIDE VASC ACCESS RIGHT  03/18/2021   LEFT HEART CATH AND CORONARY ANGIOGRAPHY N/A 04/04/2017   Procedure: Left Heart Cath and Coronary Angiography;  Surgeon: Belva Crome, MD;  Location: [facility] CV LAB;  Service: Cardiovascular;  Laterality: N/A;   [place], 2004, 2007  RHINOPLASTY  1971   ROTATOR CUFF REPAIR  2003   SURGICAL REPAIR OF HEMORRHAGE  2015   TONSILLECTOMY  1968    Social History:  Social History   Socioeconomic History   Marital status: Married    Spouse name: [name]   Number of children: 1   Years of education: Not on file   Highest education level: Professional school degree (e.g., MD, DDS, DVM, JD)  Occupational History   Occupation: retired  Tobacco Use   Smoking status: Never   Smokeless tobacco: Never  Vaping Use   Vaping Use: Never used  Substance and Sexual Activity   Alcohol use: No   Drug use: No   Sexual activity: Yes    Birth control/protection: Post-menopausal  Other Topics  Concern   Not on file  Social History Narrative   06/21/21 lives with husband   Patient reports she used to walk at [employer] but now due to Covid-19 she is staying inside.   Social Determinants of Health   Financial Resource Strain: Low Risk    Difficulty of Paying Living Expenses: Not hard at all  Food Insecurity: No Food Insecurity   Worried About Charity fundraiser in the Last Year: Never true   [place] in the Last Year: Never true  Transportation Needs: No Transportation Needs   Lack of Transportation (Medical): No   Lack of Transportation (Non-Medical): No  Physical Activity: Sufficiently Active   Days of Exercise per Week: 4 days   Minutes of Exercise per Session: 60 min  Stress: Stress Concern Present   Feeling of Stress : To some extent  Social Connections: Not on file  Intimate Partner Violence: Not on file    Family History:  Family History  Problem Relation Age of Onset   Cancer Mother        abdominal melamona   Psoriasis Mother    Alzheimer's disease Father    Cancer Cousin        colon    Diabetes Cousin    Diabetes Maternal Aunt    Colon cancer Neg Hx    Colon polyps Neg Hx    Esophageal cancer Neg Hx    Rectal cancer Neg Hx    Stomach cancer Neg Hx    Breast cancer Neg Hx     Medications:   Current Outpatient Medications on File Prior to Visit  Medication Sig Dispense Refill   albuterol (VENTOLIN HFA) 108 (90 Base) MCG/ACT inhaler Inhale 2 puffs into the lungs every 4 (four) hours as needed for wheezing or shortness of breath. 18 g 3   aspirin 81 MG EC tablet Take 1 tablet (81 mg total) by mouth daily. Swallow whole. 100 tablet 0   BESIVANCE 0.6 % SUSP Place 1 drop into both eyes See admin instructions. After  eye injections     carvedilol (COREG) 3.125 MG tablet Take 1 tablet (3.125 mg total) by mouth 2 (two) times daily with a meal. 60 tablet 0   clopidogrel (PLAVIX) 75 MG tablet Take 1 tablet (75 mg total) by mouth daily. 30 tablet 0    Continuous Blood Gluc Receiver (FREESTYLE LIBRE 2 READER) DEVI by Does not apply route.     diclofenac Sodium (VOLTAREN) 1 % GEL Apply 2 g topically 4 (four) times daily. To left wrist 150 g 0   fluticasone (FLONASE) 50 MCG/ACT nasal spray Place 2 sprays into the nose daily as needed for allergies.      hydrALAZINE (  APRESOLINE) 10 MG tablet Take 1 tablet (10 mg total) by mouth every 8 (eight) hours as needed (Sys BP >180, hold on HD days). 90 tablet 0   hydrocortisone (ANUSOL-HC) 2.5 % rectal cream Place 1 application rectally 2 (two) times daily as needed for hemorrhoids or anal itching. 30 g 1   hydrocortisone cream 1 % Apply to affected area 2 times daily 30 g 1   hydrOXYzine (ATARAX/VISTARIL) 25 MG tablet Take 1 tablet (25 mg total) by mouth at bedtime as needed. 30 tablet 2   hydrOXYzine (VISTARIL) 25 MG capsule Take 25 mg by mouth 3 (three) times daily as needed. Dose unknown     insulin glargine (LANTUS) 100 UNIT/ML Solostar Pen Inject 13 Units into the skin daily. 15 mL 0   Insulin Pen Needle (BD PEN NEEDLE NANO U/F) 32G X 4 MM MISC Use daily as directed. 100 each 0   levETIRAcetam (KEPPRA) 500 MG tablet Take 1 tablet (500 mg total) by mouth at bedtime. 30 tablet 0   melatonin 3 MG TABS tablet Take 1 tablet (3 mg total) by mouth at bedtime. 30 tablet 0   methylphenidate (RITALIN) 5 MG tablet Take 1 tablet (5 mg total) by mouth 2 (two) times daily with breakfast and lunch. 60 tablet 0   mometasone-formoterol (DULERA) 200-5 MCG/ACT AERO Inhale 2 puffs into the lungs 2 (two) times daily. 13 g 0   Nutritional Supplements (,FEEDING SUPPLEMENT, PROSOURCE PLUS) liquid Take 30 mLs by mouth 2 (two) times daily between meals. 887 mL 0   ondansetron (ZOFRAN) 4 MG tablet Take 1 tablet (4 mg total) by mouth every 6 (six) hours as needed for nausea or vomiting. 30 tablet 0   OneTouch Delica Lancets 78G MISC Use as directed to check blood sugars before breakfast and dinner dx: e11.65 100 each 11    pantoprazole (PROTONIX) 40 MG tablet Take 1 tablet (40 mg total) by mouth daily. 30 tablet 0   phenylephrine (,USE FOR PREPARATION-H,) 0.25 % suppository Place 1 suppository rectally 2 (two) times daily. 12 suppository 0   polyethylene glycol powder (GLYCOLAX/MIRALAX) 17 GM/SCOOP powder Take 17 g by mouth daily. 510 g 0   rosuvastatin (CRESTOR) 20 MG tablet Take 1 tablet (20 mg total) by mouth daily. 30 tablet 0   senna-docusate (SENOKOT-S) 8.6-50 MG tablet Take 2 tablets by mouth 2 (two) times daily. 120 tablet 0   [DISCONTINUED] iron polysaccharides (NIFEREX) 150 MG capsule Take 1 capsule (150 mg total) by mouth daily. (Patient not taking: No sig reported) 30 capsule 12   No current facility-administered medications on file prior to visit.    Allergies:   Allergies  Allergen Reactions   Food Anaphylaxis    Peanuts; Almonds   Other Shortness Of Breath    Peanuts; Almonds   Wheat Bran Shortness Of Breath   Statins Itching and Other (See Comments)    Generalized aches   Iodine Other (See Comments)    The patient denied this allergy to me on 03/16/2021   Shellfish Allergy Other (See Comments)    Mouth gets raw   Sitagliptin     Other reaction(s): Unknown   Tetracycline Other (See Comments)    Raw mouth   Tetracyclines & Related Itching   Contrast Media [Iodinated Diagnostic Agents] Rash    Happened during CT scan over 30 years ago      OBJECTIVE:  Physical Exam  Vitals:   06/21/21 1519  BP: (!) 144/80  Pulse: 82  There is no height or weight on file to calculate BMI. No results found.  General: well developed, well nourished, pleasant elderly African-American female, seated, in no evident distress Head: head normocephalic and atraumatic.   Neck: supple with no carotid or supraclavicular bruits Cardiovascular: regular rate and rhythm, no murmurs Musculoskeletal: right hand weakness s/p fistula placement, trace swelling in hand Skin:  no rash/petichiae Vascular:   Normal pulses all extremities   Neurologic Exam Mental Status: Awake and fully alert. Fluent speech and language. Oriented to place and time. Recent and remote memory intact. Attention span, concentration and fund of knowledge appropriate although husband providing majority of history. Mood and affect appropriate and cooperative with exam.  Cranial Nerves: Fundoscopic exam reveals sharp disc margins. Pupils equal, briskly reactive to light. Extraocular movements full without nystagmus. Visual fields partial left homonymous hemianopia. Hearing intact. Facial sensation intact.  Mild left lower facial weakness.  Tongue, palate moves normally and symmetrically.  Motor: LUE: 3/5 deltoid, 4/5 elbow flexion and extension, 4-/5 grip strength. LLE: 4/5 hip flexor weakness and ankle dorsiflexion weakness; RUE: 3/5 grip strength s/p fistula placement; RLE: 5/5 Sensory.: intact to touch , pinprick , position and vibratory sensation.  Coordination: Rapid alternating movements decreased in upper extremities bilaterally. Finger-to-nose difficulty performing upper extremities and heel-to-shin performed accurately bilaterally. Gait and Station: Deferred Reflexes: 1+ and symmetric. Toes downgoing.     NIHSS  3 Modified Rankin  3-4      ASSESSMENT: Carolyn Russo is a 70 y.o. year old female with R BG/CR infarct secondary to small vessel disease on 04/16/2021 after presenting with 3 days of left sided weakness. Possible seizure on 7/13  for AMS after HD session.  Vascular risk factors include HTN, HLD, DM, ESRD on HD.      PLAN:  R BG/CR infarct :  Residual deficit: Left hemiparesis overall improving since d/c. ?left homonymous hemianopia although pt declines vision issues.  Consider evaluation by ophthalmology after follow up visit if indicated (husband is an ophthalmologist who has noticed decreased vision on left side since stroke). Continue HH therapies.  continue clopidogrel 75 mg daily  and Crestor  for secondary stroke prevention. D/c aspirin as prolonged DAPT not indicated for stroke prevention  Discussed secondary stroke prevention measures and importance of close PCP follow up for aggressive stroke risk factor management. I have gone over the pathophysiology of stroke, warning signs and symptoms, risk factors and their management in some detail with instructions to go to the closest emergency room for symptoms of concern. Post stroke seizures: Continue Keppra 500 mg nightly (renal dose) for seizure prophylaxis HTN: BP goal <130/90.  Stable on current regimen per PCP HLD: LDL goal <70. Recent LDL 70 Crestor 20 mg daily DMII: A1c goal<7.0. Recent A1c 5.2.  Monitor by PCP R arm weakness s/p fistula placement: Eval by Dr. 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  Continues to decline interest in fistula litigation.  Continue to follow with vascular surgery    Follow up in 3 months or call earlier if needed   CC:  [place] provider: Dr. Leonie Man PCP: [name], MD    I spent 56 minutes of face-to-face and non-face-to-face time with patient and husband.  This included previsit chart review including review of recent hospitalization, lab review, study review, order entry, electronic health record documentation, patient and husband education regarding recent stroke and etiology, residual deficits and typical recovery time, secondary stroke prevention measures and importance of  managing stroke risk factors, right arm weakness s/p fistula placement and answered all questions to patient and husband's satisfaction   Frann Rider, [facility]  [facility] [address] [place], [place] [address]  Phone [phone] Fax [phone] Note: This document was prepared with digital dictation and possible smart phrase technology. Any transcriptional errors that result from this process are unintentional.

## 2021-06-21 NOTE — Chronic Care Management (AMB) (Signed)
  Care Management   Note  06/21/2021 Name: Carolyn Russo MRN: [id number] DOB: 1951/04/26  [name] is a 70 y.o. year old female who is a primary care patient of [name], MD and is actively engaged with the care management team. I reached out to Lyndee Hensen by phone today to assist with re-scheduling a follow up visit with the Pharmacist  Follow up plan: Telephone appointment with care management team member scheduled for: 08/18/21  [facility]  Direct Dial: [phone]

## 2021-06-21 NOTE — Patient Instructions (Addendum)
Continue clopidogrel 75 mg daily  and Crestor 20mg  daily  for secondary stroke prevention  No indication for continued use of aspirin from a stroke standpoint   Continue to follow up with PCP regarding cholesterol and blood pressure management  Maintain strict control of hypertension with blood pressure goal below 130/90 and cholesterol with LDL cholesterol (bad cholesterol) goal below 70 mg/dL.   Continue keppra 500mg  nightly for stroke prevention      Followup in the future with me in 3 months or call earlier if needed       Thank you for coming to see [place] at [facility]. I hope we have been able to provide you high quality care today.  You may receive a patient satisfaction survey over the next few weeks. We would appreciate your feedback and comments so that we may continue to improve ourselves and the health of our patients.

## 2021-06-22 ENCOUNTER — Ambulatory Visit (INDEPENDENT_AMBULATORY_CARE_PROVIDER_SITE_OTHER): Payer: Medicare Other

## 2021-06-22 DIAGNOSIS — I2510 Atherosclerotic heart disease of native coronary artery without angina pectoris: Secondary | ICD-10-CM | POA: Diagnosis not present

## 2021-06-22 DIAGNOSIS — E1122 Type 2 diabetes mellitus with diabetic chronic kidney disease: Secondary | ICD-10-CM | POA: Diagnosis not present

## 2021-06-22 DIAGNOSIS — I5033 Acute on chronic diastolic (congestive) heart failure: Secondary | ICD-10-CM | POA: Diagnosis not present

## 2021-06-22 DIAGNOSIS — D631 Anemia in chronic kidney disease: Secondary | ICD-10-CM | POA: Diagnosis not present

## 2021-06-22 DIAGNOSIS — E1165 Type 2 diabetes mellitus with hyperglycemia: Secondary | ICD-10-CM

## 2021-06-22 DIAGNOSIS — I132 Hypertensive heart and chronic kidney disease with heart failure and with stage 5 chronic kidney disease, or end stage renal disease: Secondary | ICD-10-CM | POA: Diagnosis not present

## 2021-06-22 DIAGNOSIS — I69354 Hemiplegia and hemiparesis following cerebral infarction affecting left non-dominant side: Secondary | ICD-10-CM | POA: Diagnosis not present

## 2021-06-22 DIAGNOSIS — N186 End stage renal disease: Secondary | ICD-10-CM | POA: Diagnosis not present

## 2021-06-22 NOTE — Progress Notes (Signed)
I agree with the above plan 

## 2021-06-22 NOTE — Chronic Care Management (AMB) (Signed)
Chronic Care Management    Social Work Note  06/22/2021 Name: Carolyn Russo MRN: [id number] DOB: [birth year]  Carolyn Russo is a 70 y.o. year old female who is a primary care patient of Carolyn Chard, MD. The CCM team was consulted to assist the patient with chronic disease management and/or care coordination needs related to:  Acute on Chronic Diastolic CHF, CAD, and DM II .   Engaged with patients spouse Carolyn Russo by phone  for follow up visit in response to provider referral for social work chronic care management and care coordination services.   Consent to Services:  The patient was given information about Chronic Care Management services, agreed to services, and gave verbal consent prior to initiation of services.  Please see initial visit note for detailed documentation.   Patient agreed to services and consent obtained.   Assessment: Review of patient past medical history, allergies, medications, and health status, including review of relevant consultants reports was performed today as part of a comprehensive evaluation and provision of chronic care management and care coordination services.     SDOH (Social Determinants of Health) assessments and interventions performed:    Advanced Directives Status: Not addressed in this encounter.  CCM Care Plan  Allergies  Allergen Reactions   Food Anaphylaxis    Peanuts; Almonds   Other Shortness Of Breath    Peanuts; Almonds   Wheat Bran Shortness Of Breath   Statins Itching and Other (See Comments)    Generalized aches   Iodine Other (See Comments)    The patient denied this allergy to me on 03/16/2021   Shellfish Allergy Other (See Comments)    Mouth gets raw   Sitagliptin     Other reaction(s): Unknown   Tetracycline Other (See Comments)    Raw mouth   Tetracyclines & Related Itching   Contrast Media [Iodinated Diagnostic Agents] Rash    Happened during CT scan over 30 years ago    Outpatient Encounter Medications as  of 06/22/2021  Medication Sig Note   albuterol (VENTOLIN HFA) 108 (90 Base) MCG/ACT inhaler Inhale 2 puffs into the lungs every 4 (four) hours as needed for wheezing or shortness of breath.    BESIVANCE 0.6 % SUSP Place 1 drop into both eyes See admin instructions. After  eye injections    carvedilol (COREG) 3.125 MG tablet Take 1 tablet (3.125 mg total) by mouth 2 (two) times daily with a meal.    clopidogrel (PLAVIX) 75 MG tablet Take 1 tablet (75 mg total) by mouth daily.    Continuous Blood Gluc Receiver (FREESTYLE LIBRE 2 READER) DEVI by Does not apply route.    diclofenac Sodium (VOLTAREN) 1 % GEL Apply 2 g topically 4 (four) times daily. To left wrist    fluticasone (FLONASE) 50 MCG/ACT nasal spray Place 2 sprays into the nose daily as needed for allergies.     hydrALAZINE (APRESOLINE) 10 MG tablet Take 1 tablet (10 mg total) by mouth every 8 (eight) hours as needed (Sys BP >180, hold on HD days).    hydrocortisone (ANUSOL-HC) 2.5 % rectal cream Place 1 application rectally 2 (two) times daily as needed for hemorrhoids or anal itching.    hydrocortisone cream 1 % Apply to affected area 2 times daily    hydrOXYzine (ATARAX/VISTARIL) 25 MG tablet Take 1 tablet (25 mg total) by mouth at bedtime as needed.    hydrOXYzine (VISTARIL) 25 MG capsule Take 25 mg by mouth 3 (three) times daily as  needed. Dose unknown    insulin glargine (LANTUS) 100 UNIT/ML Solostar Pen Inject 13 Units into the skin daily.    Insulin Pen Needle (BD PEN NEEDLE NANO U/F) 32G X 4 MM MISC Use daily as directed.    levETIRAcetam (KEPPRA) 500 MG tablet Take 1 tablet (500 mg total) by mouth at bedtime.    melatonin 3 MG TABS tablet Take 1 tablet (3 mg total) by mouth at bedtime.    methylphenidate (RITALIN) 5 MG tablet Take 1 tablet (5 mg total) by mouth 2 (two) times daily with breakfast and lunch.    mometasone-formoterol (DULERA) 200-5 MCG/ACT AERO Inhale 2 puffs into the lungs 2 (two) times daily.    Nutritional  Supplements (,FEEDING SUPPLEMENT, PROSOURCE PLUS) liquid Take 30 mLs by mouth 2 (two) times daily between meals.    ondansetron (ZOFRAN) 4 MG tablet Take 1 tablet (4 mg total) by mouth every 6 (six) hours as needed for nausea or vomiting.    OneTouch Delica Lancets 29U MISC Use as directed to check blood sugars before breakfast and dinner dx: e11.65    pantoprazole (PROTONIX) 40 MG tablet Take 1 tablet (40 mg total) by mouth daily.    phenylephrine (,USE FOR PREPARATION-H,) 0.25 % suppository Place 1 suppository rectally 2 (two) times daily.    polyethylene glycol powder (GLYCOLAX/MIRALAX) 17 GM/SCOOP powder Take 17 g by mouth daily. 06/21/2021: As needed   rosuvastatin (CRESTOR) 20 MG tablet Take 1 tablet (20 mg total) by mouth daily.    senna-docusate (SENOKOT-S) 8.6-50 MG tablet Take 2 tablets by mouth 2 (two) times daily.    [DISCONTINUED] iron polysaccharides (NIFEREX) 150 MG capsule Take 1 capsule (150 mg total) by mouth daily. (Patient not taking: No sig reported)    No facility-administered encounter medications on file as of 06/22/2021.    Patient Active Problem List   Diagnosis Date Noted   Stroke-like symptoms 05/06/2021   Bandemia    Cerebrovascular accident (CVA) of right basal ganglia ([place]) 04/20/2021   External hemorrhoids    Basal ganglia infarction ([place]) 04/16/2021   ESRD on dialysis ([name]) 04/16/2021   Hypertensive urgency 03/10/2021   Hilar enlargement    Anemia of chronic disease    Chronic diastolic CHF (congestive heart failure) ([place]) 03/09/2021   CKD (chronic kidney disease), stage V ([place]) 03/08/2021   Diabetic retinopathy ([place]) 03/08/2021   Hyperglycemia due to type 2 diabetes mellitus ([place]) 03/08/2021   Pure hypercholesterolemia 03/08/2021   Anemia in chronic kidney disease 09/24/2020   Uncontrolled type 2 diabetes mellitus with hyperglycemia ([place]) 03/19/2019   Osteopenia 09/04/2018   Migraine 09/04/2018   Asthma 09/04/2018   Pseudophakia of both eyes  07/17/2018   Pseudophakia of left eye 07/03/2018   Open angle with borderline findings and high glaucoma risk in left eye 07/03/2018   Age-related nuclear cataract of right eye 07/03/2018   CAD (coronary artery disease), native coronary artery 04/27/2017   Abnormal nuclear stress test 04/04/2017   Dyspnea on exertion 03/09/2017   Appendicitis    Age-related hypermature cataract of both eyes 12/20/2016   Uterine leiomyoma 11/27/2012   Dyslipidemia (high LDL; low HDL) 11/25/2011   Obesity (BMI 30-39.9) 11/25/2011   Hypertensive disorder 11/21/2011   Type 2 diabetes mellitus ([place]) 11/21/2011   Abnormal EKG 11/21/2011    Conditions to be addressed/monitored: CHF, CAD, and DMII; Limited access to caregiver  Care Plan : Social Work [facility] Plan of Care  Updates made by Daneen Schick since 06/22/2021 12:00 AM  Problem: Disease Progression      Long-Range Goal: Disease Progression Managed   Start Date: 03/30/2021  Expected End Date: 07/28/2021  Recent Progress: On track  Priority: High  Note:   Current Barriers:  Chronic disease management support and education needs related to  Acute Pulmonary Edema, Acute on Chronic Diastolic CHF, CKD Stage V, and DM II   Recent 15 day inpatient day Limited knowledge of resources to assist with patient care needs ADL/iADL limitations  Social Worker Clinical Goal(s):  patient will work with SW to identify and address any acute and/or chronic care coordination needs related to the self health management of  Acute Pulmonary Edema, Acute on Chronic Diastolic CHF, CKD Stage V, DM II   Patient will work with SW to become more knowledgeable of transportation resources to assist with transportation home from Dialysis Patient will work with SW to identify caregiver resources to assist with patient care needs Patient will engage with Consulting civil engineer to develop an individualized plan of care to address clinical care management needs  SW Interventions:   Inter-disciplinary care team collaboration (see longitudinal plan of care) Collaboration with Carolyn Chard, MD regarding development and update of comprehensive plan of care as evidenced by provider attestation and co-signature Successful outbound call placed to the patient spouse, Antigone Crowell, to assist with care coordination needs Mr. Burling indicates he is paying out of pocket for caregiver assistance in the home Discussed the patient is working with Alvis Lemmings PT and OT - patient has only received one aid visit and has not seen a nurse Determined Mr. Ziolkowski did contact [facility] who said they did not have an aid to send last week due to staffing and vacation Advised Mr. Thayer SW would contact [facility] to follow up on service needs - Mr. Selvage is on the way to an appointment and requests SW contact him later this week with outcome Successful outbound call placed to [facility] home health to determine patient is scheduled to receive an aid in the home Tuesday and Thursday of this week. Patient does not have orders for an RN   Patient Goals/Self-Care Activities patient will:   -  Contact primary care provider as needed  -Participate with home health services -Contact SW as needed prior to next scheduled call  Follow Up Plan:  SW will contact the patient over the next 4 days       Follow Up Plan: SW will follow up with patient by phone over the next 4 days      Daneen Schick, BSW, CDP Social Worker, Certified Dementia Practitioner [place] / [place] [facility] [phone]

## 2021-06-22 NOTE — Patient Instructions (Signed)
Social Worker Visit Information  Goals we discussed today:   Goals Addressed             This Visit's Progress    Disease Progression Managed       Timeframe:  Long-Range Goal Priority:  High Start Date:  6.7.22                           Expected End Date: 10.5.22                      Next planned outreach: 9.1.22  Patient Goals/Self-Care Activities patient will:   - Contact primary care provider as needed -Participate with home health services -Contact SW as needed prior to next scheduled call         Follow Up Plan: SW will follow up with patient by phone over the next 4 days   Daneen Schick, BSW, CDP Social Worker, Certified Dementia Practitioner [place] / [place] [facility] [phone]

## 2021-06-23 DIAGNOSIS — N186 End stage renal disease: Secondary | ICD-10-CM | POA: Diagnosis not present

## 2021-06-23 DIAGNOSIS — Z992 Dependence on renal dialysis: Secondary | ICD-10-CM | POA: Diagnosis not present

## 2021-06-23 DIAGNOSIS — D631 Anemia in chronic kidney disease: Secondary | ICD-10-CM | POA: Diagnosis not present

## 2021-06-23 DIAGNOSIS — N2581 Secondary hyperparathyroidism of renal origin: Secondary | ICD-10-CM | POA: Diagnosis not present

## 2021-06-23 DIAGNOSIS — D509 Iron deficiency anemia, unspecified: Secondary | ICD-10-CM | POA: Diagnosis not present

## 2021-06-24 ENCOUNTER — Encounter: Payer: Medicare Other | Admitting: Registered Nurse

## 2021-06-24 ENCOUNTER — Ambulatory Visit (INDEPENDENT_AMBULATORY_CARE_PROVIDER_SITE_OTHER): Payer: Medicare Other

## 2021-06-24 DIAGNOSIS — E1165 Type 2 diabetes mellitus with hyperglycemia: Secondary | ICD-10-CM

## 2021-06-24 DIAGNOSIS — I5033 Acute on chronic diastolic (congestive) heart failure: Secondary | ICD-10-CM

## 2021-06-24 DIAGNOSIS — N2581 Secondary hyperparathyroidism of renal origin: Secondary | ICD-10-CM | POA: Diagnosis not present

## 2021-06-24 DIAGNOSIS — D631 Anemia in chronic kidney disease: Secondary | ICD-10-CM | POA: Diagnosis not present

## 2021-06-24 DIAGNOSIS — R29898 Other symptoms and signs involving the musculoskeletal system: Secondary | ICD-10-CM | POA: Diagnosis not present

## 2021-06-24 DIAGNOSIS — E1122 Type 2 diabetes mellitus with diabetic chronic kidney disease: Secondary | ICD-10-CM | POA: Diagnosis not present

## 2021-06-24 DIAGNOSIS — G458 Other transient cerebral ischemic attacks and related syndromes: Secondary | ICD-10-CM | POA: Diagnosis not present

## 2021-06-24 DIAGNOSIS — N186 End stage renal disease: Secondary | ICD-10-CM | POA: Diagnosis not present

## 2021-06-24 DIAGNOSIS — I69354 Hemiplegia and hemiparesis following cerebral infarction affecting left non-dominant side: Secondary | ICD-10-CM | POA: Diagnosis not present

## 2021-06-24 DIAGNOSIS — I2510 Atherosclerotic heart disease of native coronary artery without angina pectoris: Secondary | ICD-10-CM

## 2021-06-24 DIAGNOSIS — D509 Iron deficiency anemia, unspecified: Secondary | ICD-10-CM | POA: Diagnosis not present

## 2021-06-24 DIAGNOSIS — I132 Hypertensive heart and chronic kidney disease with heart failure and with stage 5 chronic kidney disease, or end stage renal disease: Secondary | ICD-10-CM | POA: Diagnosis not present

## 2021-06-24 DIAGNOSIS — I639 Cerebral infarction, unspecified: Secondary | ICD-10-CM | POA: Diagnosis not present

## 2021-06-24 DIAGNOSIS — Z992 Dependence on renal dialysis: Secondary | ICD-10-CM | POA: Diagnosis not present

## 2021-06-24 NOTE — Chronic Care Management (AMB) (Signed)
Chronic Care Management    Social Work Note  06/24/2021 Name: Carolyn Russo MRN: [id number] DOB: 1951/02/21  Carolyn Russo is a 70 y.o. year old female who is a primary care patient of [name], MD. The CCM team was consulted to assist the patient with chronic disease management and/or care coordination needs related to:  Acute on Chronic Diastolic CHF, CAD, and DM II .   Engaged with patients spouse Athelene Hursey by phone  for follow up visit in response to provider referral for social work chronic care management and care coordination services.   Consent to Services:  The patient was given information about Chronic Care Management services, agreed to services, and gave verbal consent prior to initiation of services.  Please see initial visit note for detailed documentation.   Patient agreed to services and consent obtained.   Assessment: Review of patient past medical history, allergies, medications, and health status, including review of relevant consultants reports was performed today as part of a comprehensive evaluation and provision of chronic care management and care coordination services.     SDOH (Social Determinants of Health) assessments and interventions performed:    Advanced Directives Status: Not addressed in this encounter.  CCM Care Plan  Allergies  Allergen Reactions   Food Anaphylaxis    Peanuts; Almonds   Other Shortness Of Breath    Peanuts; Almonds   Wheat Bran Shortness Of Breath   Statins Itching and Other (See Comments)    Generalized aches   Iodine Other (See Comments)    The patient denied this allergy to me on 03/16/2021   Shellfish Allergy Other (See Comments)    Mouth gets raw   Sitagliptin     Other reaction(s): Unknown   Tetracycline Other (See Comments)    Raw mouth   Tetracyclines & Related Itching   Contrast Media [Iodinated Diagnostic Agents] Rash    Happened during CT scan over 30 years ago    Outpatient Encounter Medications as  of 06/24/2021  Medication Sig Note   albuterol (VENTOLIN HFA) 108 (90 Base) MCG/ACT inhaler Inhale 2 puffs into the lungs every 4 (four) hours as needed for wheezing or shortness of breath.    BESIVANCE 0.6 % SUSP Place 1 drop into both eyes See admin instructions. After  eye injections    carvedilol (COREG) 3.125 MG tablet Take 1 tablet (3.125 mg total) by mouth 2 (two) times daily with a meal.    clopidogrel (PLAVIX) 75 MG tablet Take 1 tablet (75 mg total) by mouth daily.    Continuous Blood Gluc Receiver (FREESTYLE LIBRE 2 READER) DEVI by Does not apply route.    diclofenac Sodium (VOLTAREN) 1 % GEL Apply 2 g topically 4 (four) times daily. To left wrist    fluticasone (FLONASE) 50 MCG/ACT nasal spray Place 2 sprays into the nose daily as needed for allergies.     hydrALAZINE (APRESOLINE) 10 MG tablet Take 1 tablet (10 mg total) by mouth every 8 (eight) hours as needed (Sys BP >180, hold on HD days).    hydrocortisone (ANUSOL-HC) 2.5 % rectal cream Place 1 application rectally 2 (two) times daily as needed for hemorrhoids or anal itching.    hydrocortisone cream 1 % Apply to affected area 2 times daily    hydrOXYzine (ATARAX/VISTARIL) 25 MG tablet Take 1 tablet (25 mg total) by mouth at bedtime as needed.    hydrOXYzine (VISTARIL) 25 MG capsule Take 25 mg by mouth 3 (three) times daily as  needed. Dose unknown    insulin glargine (LANTUS) 100 UNIT/ML Solostar Pen Inject 13 Units into the skin daily.    Insulin Pen Needle (BD PEN NEEDLE NANO U/F) 32G X 4 MM MISC Use daily as directed.    levETIRAcetam (KEPPRA) 500 MG tablet Take 1 tablet (500 mg total) by mouth at bedtime.    melatonin 3 MG TABS tablet Take 1 tablet (3 mg total) by mouth at bedtime.    methylphenidate (RITALIN) 5 MG tablet Take 1 tablet (5 mg total) by mouth 2 (two) times daily with breakfast and lunch.    mometasone-formoterol (DULERA) 200-5 MCG/ACT AERO Inhale 2 puffs into the lungs 2 (two) times daily.    Nutritional  Supplements (,FEEDING SUPPLEMENT, PROSOURCE PLUS) liquid Take 30 mLs by mouth 2 (two) times daily between meals.    ondansetron (ZOFRAN) 4 MG tablet Take 1 tablet (4 mg total) by mouth every 6 (six) hours as needed for nausea or vomiting.    OneTouch Delica Lancets 94B MISC Use as directed to check blood sugars before breakfast and dinner dx: e11.65    pantoprazole (PROTONIX) 40 MG tablet Take 1 tablet (40 mg total) by mouth daily.    phenylephrine (,USE FOR PREPARATION-H,) 0.25 % suppository Place 1 suppository rectally 2 (two) times daily.    polyethylene glycol powder (GLYCOLAX/MIRALAX) 17 GM/SCOOP powder Take 17 g by mouth daily. 06/21/2021: As needed   rosuvastatin (CRESTOR) 20 MG tablet Take 1 tablet (20 mg total) by mouth daily.    senna-docusate (SENOKOT-S) 8.6-50 MG tablet Take 2 tablets by mouth 2 (two) times daily.    [DISCONTINUED] iron polysaccharides (NIFEREX) 150 MG capsule Take 1 capsule (150 mg total) by mouth daily. (Patient not taking: No sig reported)    No facility-administered encounter medications on file as of 06/24/2021.    Patient Active Problem List   Diagnosis Date Noted   Stroke-like symptoms 05/06/2021   Bandemia    Cerebrovascular accident (CVA) of right basal ganglia ([place]) 04/20/2021   External hemorrhoids    Basal ganglia infarction ([place]) 04/16/2021   ESRD on dialysis ([place]) 04/16/2021   Hypertensive urgency 03/10/2021   Hilar enlargement    Anemia of chronic disease    Chronic diastolic CHF (congestive heart failure) ([place]) 03/09/2021   CKD (chronic kidney disease), stage V ([place]) 03/08/2021   Diabetic retinopathy ([place]) 03/08/2021   Hyperglycemia due to type 2 diabetes mellitus ([place]) 03/08/2021   Pure hypercholesterolemia 03/08/2021   Anemia in chronic kidney disease 09/24/2020   Uncontrolled type 2 diabetes mellitus with hyperglycemia ([place]) 03/19/2019   Osteopenia 09/04/2018   Migraine 09/04/2018   Asthma 09/04/2018   Pseudophakia of both eyes 07/17/2018    Pseudophakia of left eye 07/03/2018   Open angle with borderline findings and high glaucoma risk in left eye 07/03/2018   Age-related nuclear cataract of right eye 07/03/2018   CAD (coronary artery disease), native coronary artery 04/27/2017   Abnormal nuclear stress test 04/04/2017   Dyspnea on exertion 03/09/2017   Appendicitis    Age-related hypermature cataract of both eyes 12/20/2016   Uterine leiomyoma 11/27/2012   Dyslipidemia (high LDL; low HDL) 11/25/2011   Obesity (BMI 30-39.9) 11/25/2011   Hypertensive disorder 11/21/2011   Type 2 diabetes mellitus ([place]) 11/21/2011   Abnormal EKG 11/21/2011    Conditions to be addressed/monitored: CHF, CAD, and DMII; Limited access to caregiver  Care Plan : Social Work [facility] Plan of Care  Updates made by Daneen Schick since 06/24/2021 12:00 AM  Completed 06/24/2021   Problem: Disease Progression Resolved 06/24/2021     Long-Range Goal: Disease Progression Managed Completed 06/24/2021  Start Date: 03/30/2021  Expected End Date: 07/28/2021  Recent Progress: On track  Priority: High  Note:   Current Barriers:  Chronic disease management support and education needs related to  Acute Pulmonary Edema, Acute on Chronic Diastolic CHF, CKD Stage V, and DM II   Recent 15 day inpatient day Limited knowledge of resources to assist with patient care needs ADL/iADL limitations  Social Worker Clinical Goal(s):  patient will work with SW to identify and address any acute and/or chronic care coordination needs related to the self health management of  Acute Pulmonary Edema, Acute on Chronic Diastolic CHF, CKD Stage V, DM II   Patient will work with SW to become more knowledgeable of transportation resources to assist with transportation home from Dialysis Patient will work with SW to identify caregiver resources to assist with patient care needs Patient will engage with Consulting civil engineer to develop an individualized plan of care to address clinical care  management needs  SW Interventions:  Inter-disciplinary care team collaboration (see longitudinal plan of care) Collaboration with [name], MD regarding development and update of comprehensive plan of care as evidenced by provider attestation and co-signature Successful outbound call placed to the patient spouse, Artasia Thang, to assist with care coordination needs Advised Mr. Detzel SW did speak with Alvis Lemmings who indicates patient was scheduled to have an aid this week on Tuesday and Thursday Determined the patient did not receive an aid on Tuesday - Mr. Lacek indicates the patient has only received one aid visit since returning home Discussed Mr. Linzy is paying out of pocket for caregivers to stay with the patient when he is at work and had adjusted timeframe's based on the expectation Alvis Lemmings was sending an aid twice a week at a specific time Advised Mr. Stanwood SW would [employer] to request they follow up with him regarding aid schedule and discuss barriers to care at this time Successful outbound call placed to [facility] spoke with Clarise Cruz who reports she will contact Mr. Allcorn to review the aid schedule Collaboration with [facility] to advise of intervention and plan for [facility] to follow up directly with Mr. Mangen  Patient Goals/Self-Care Activities patient will:   -  Contact primary care provider as needed  -Participate with home health services -Contact SW as needed   Follow Up Plan: No SW follow up planned at this time patient has caregiver in place. Collaboration with RN Care Manager to request SW outreach as needed with future care coordination needs       Follow Up Plan:  No follow up planned at this time. Patient has caregiver in place. SW is available as needed to assist with future care coordination needs.      Daneen Schick, BSW, CDP Social Worker, Certified Dementia Practitioner [place] / [place] [facility] [phone]

## 2021-06-24 NOTE — Patient Instructions (Signed)
Social Worker Visit Information  Goals we discussed today:   Goals Addressed             This Visit's Progress    COMPLETED: Disease Progression Managed       Timeframe:  Long-Range Goal Priority:  High Start Date:  6.7.22                           Expected End Date: 10.5.22                       Patient Goals/Self-Care Activities patient will:   - Contact primary care provider as needed -Participate with home health services -Contact SW as needed          Follow Up Plan:  No SW follow up planned at this time. Please contact me as needed.   Daneen Schick, BSW, CDP Social Worker, Certified Dementia Practitioner [place] / [place] [facility] [phone]

## 2021-06-25 ENCOUNTER — Telehealth: Payer: Self-pay | Admitting: *Deleted

## 2021-06-25 ENCOUNTER — Encounter: Payer: Medicare Other | Attending: Registered Nurse | Admitting: Physical Medicine and Rehabilitation

## 2021-06-25 ENCOUNTER — Encounter: Payer: Self-pay | Admitting: Physical Medicine and Rehabilitation

## 2021-06-25 ENCOUNTER — Other Ambulatory Visit: Payer: Self-pay

## 2021-06-25 VITALS — BP 120/71 | HR 75 | Temp 98.3°F

## 2021-06-25 DIAGNOSIS — D631 Anemia in chronic kidney disease: Secondary | ICD-10-CM | POA: Diagnosis not present

## 2021-06-25 DIAGNOSIS — G458 Other transient cerebral ischemic attacks and related syndromes: Secondary | ICD-10-CM | POA: Diagnosis not present

## 2021-06-25 DIAGNOSIS — D509 Iron deficiency anemia, unspecified: Secondary | ICD-10-CM | POA: Diagnosis not present

## 2021-06-25 DIAGNOSIS — R29898 Other symptoms and signs involving the musculoskeletal system: Secondary | ICD-10-CM | POA: Insufficient documentation

## 2021-06-25 DIAGNOSIS — N186 End stage renal disease: Secondary | ICD-10-CM | POA: Insufficient documentation

## 2021-06-25 DIAGNOSIS — N2581 Secondary hyperparathyroidism of renal origin: Secondary | ICD-10-CM | POA: Diagnosis not present

## 2021-06-25 DIAGNOSIS — I639 Cerebral infarction, unspecified: Secondary | ICD-10-CM

## 2021-06-25 DIAGNOSIS — Z992 Dependence on renal dialysis: Secondary | ICD-10-CM | POA: Diagnosis not present

## 2021-06-25 DIAGNOSIS — I6381 Other cerebral infarction due to occlusion or stenosis of small artery: Secondary | ICD-10-CM

## 2021-06-25 MED ORDER — LEVETIRACETAM 500 MG PO TABS: 500.0000 mg | ORAL_TABLET | Freq: Every day | ORAL | 1 refills | Status: DC

## 2021-06-25 MED ORDER — METHYLPHENIDATE HCL 5 MG PO TABS: 5.0000 mg | ORAL_TABLET | Freq: Two times a day (BID) | ORAL | 0 refills | Status: DC

## 2021-06-25 NOTE — Patient Instructions (Signed)
[name] with recent hx of L hemiparesis due to R basal ganglia infarct in 7/22; R hip bursitis; anxiety; LGIB; dCHF; on HD from ESRD; HTN, BMI of 34 and DM- A1c 5.2- here for f/u on stroke.   Will continue Ritalin 5 mg BID- for attention/initiation- #60- no refills- Check to see if they will allow 3 months supply.   2.  Keppra-500 mg nightly- # 90- 3 months supply.   3. No signs of spasticity- unlikely to develop. If no spasticity by 3 months, we are good; went over signs/symptoms- if she does, let me know.    4. Will schedule Dr Letta Pate to do EMG/NCS - on Plavix - off ASA; not on Coumadin. For R hand/RUE- due to concern about steal syndrome.   5. Call me or I will call to go over EMG results-   6. If Not using RUE fistula- I would get rid of it. But [facility] doesn't want more surgery. Dexterity is still poor.   7.  H/H- don't know when to finish- I can write for outpt PT and OT- call me when 1 week left- and I can send for outpt therapy- no SLP.   8. F/U in 3 months

## 2021-06-25 NOTE — Telephone Encounter (Signed)
Prior auth for methylphenidate submitted via CoverMyMeds to [facility].

## 2021-06-25 NOTE — Progress Notes (Signed)
Subjective:    Patient ID: Carolyn Russo, female    DOB: 13-Sep-1951, 70 y.o.   MRN: [id number]  HPI [name] with recent hx of L hemiparesis due to R basal ganglia infarct; R hip bursitis; anxiety; LGIB; dCHF; on HD from ESRD; HTN, BMI of 34 and DM- A1c 5.2- here for f/u on stroke.   R hand still an issue from steal syndrome?- feels like waking up from a deep sleep- does feel like IS waking up.  Has seen vascular team- says could take fistula back down- but doesn't want to have surgery reversed yet- considering it-   Feels like L side getting stronger-  Feels more awake- looks a lot more awake.   Just had HD this AM. Ready to go to bed.   Is treating the nausea- getting Zofran before HD and sometimes when at HD.  Her Husband prepares breakfast.   Doing H/H therapy- OT just started last week- working on the R hand;  PT- walking with them- 2x/week- all around her house- 50- 75 ft.  May stop some- and sit sometimes- can do it, but fatiguing- doesn't feel  confident can walk it.          Nausea all the time- esp during HD. -    Pain Inventory Average Pain 6 Pain Right Now 6 My pain is intermittent and burning  LOCATION OF PAIN  hand  BOWEL Number of stools per week: 7 Oral laxative use Yes  Type of laxative senakot, miralax Enema or suppository use No  History of colostomy No  Incontinent No   BLADDER Pads In and out cath, frequency na Able to self cath  na Bladder incontinence Yes  Frequent urination No  Leakage with coughing No  Difficulty starting stream No  Incomplete bladder emptying No    Mobility walk with assistance use a walker how many minutes can you walk? 5-10 ability to climb steps?  yes do you drive?  no use a wheelchair needs help with transfers  Function retired I need assistance with the following:  feeding, dressing, bathing, toileting, meal prep, household duties, and shopping  Neuro/Psych bladder control  problems bowel control problems weakness numbness tremor tingling trouble walking  Prior Studies Any changes since last visit?  no  Physicians involved in your care Any changes since last visit?  yes Primary care Dr. Baird Cancer PA Neurologist [facility]   Family History  Problem Relation Age of Onset   Cancer Mother        abdominal melamona   Psoriasis Mother    Alzheimer's disease Father    Cancer Cousin        colon    Diabetes Cousin    Diabetes Maternal Aunt    Colon cancer Neg Hx    Colon polyps Neg Hx    Esophageal cancer Neg Hx    Rectal cancer Neg Hx    Stomach cancer Neg Hx    Breast cancer Neg Hx    Social History   Socioeconomic History   Marital status: Married    Spouse name: Carolyn Russo   Number of children: 1   Years of education: Not on file   Highest education level: Professional school degree (e.g., MD, DDS, DVM, JD)  Occupational History   Occupation: retired  Tobacco Use   Smoking status: Never   Smokeless tobacco: Never  Vaping Use   Vaping Use: Never used  Substance and Sexual  Activity   Alcohol use: No   Drug use: No   Sexual activity: Yes    Birth control/protection: Post-menopausal  Other Topics Concern   Not on file  Social History Narrative   06/21/21 lives with husband   Patient reports she used to walk at [employer] but now due to Covid-19 she is staying inside.   Social Determinants of Health   Financial Resource Strain: Low Risk    Difficulty of Paying Living Expenses: Not hard at all  Food Insecurity: No Food Insecurity   Worried About Charity fundraiser in the Last Year: Never true   [place] in the Last Year: Never true  Transportation Needs: No Transportation Needs   Lack of Transportation (Medical): No   Lack of Transportation (Non-Medical): No  Physical Activity: Sufficiently Active   Days of Exercise per Week: 4 days   Minutes of Exercise per Session: 60 min  Stress: Stress Concern Present    Feeling of Stress : To some extent  Social Connections: Not on file   Past Surgical History:  Procedure Laterality Date   APPENDECTOMY  1761   BASCILIC VEIN TRANSPOSITION Right 04/30/2021   Procedure: RIGHT FIRST STAGE [place];  Surgeon: Angelia Mould, MD;  Location: [facility];  Service: Vascular;  Laterality: Right;   CESAREAN SECTION  1985   COLONOSCOPY     ESOPHAGOGASTRODUODENOSCOPY (EGD) WITH PROPOFOL N/A 04/18/2021   Procedure: ESOPHAGOGASTRODUODENOSCOPY (EGD) WITH PROPOFOL;  Surgeon: Gatha Mayer, MD;  Location: [facility];  Service: Endoscopy;  Laterality: N/A;   EYE SURGERY     bilateral cataract    FLEXIBLE SIGMOIDOSCOPY N/A 04/18/2021   Procedure: FLEXIBLE SIGMOIDOSCOPY;  Surgeon: Gatha Mayer, MD;  Location: [facility] ENDOSCOPY;  Service: Endoscopy;  Laterality: N/A;   HERNIA REPAIR  1959   IR FLUORO GUIDE CV LINE RIGHT  03/18/2021   IR US GUIDE VASC ACCESS RIGHT  03/18/2021   LEFT HEART CATH AND CORONARY ANGIOGRAPHY N/A 04/04/2017   Procedure: Left Heart Cath and Coronary Angiography;  Surgeon: Belva Crome, MD;  Location: [facility];  Service: Cardiovascular;  Laterality: N/A;   MYOMECTOMY  1980, 2004, 2007   RHINOPLASTY  1971   ROTATOR CUFF REPAIR  2003   SURGICAL REPAIR OF HEMORRHAGE  2015   TONSILLECTOMY  1968   Past Medical History:  Diagnosis Date   Acute GI bleeding    Allergy    Anemia    Anterior chest wall pain    Appendicitis 1965   Asthma    Body mass index 37.0-37.9, adult    Breast pain    Cataract    both eyes   CHF (congestive heart failure) ([place])    Chronic kidney disease    stage 5 - on dialysis   Cognitive change 04/20/2021   r/t cva 03/2821   Dehydration 2014   Deviated septum 1971   Diabetes mellitus    no meds   Dyspnea 2014   Extrinsic asthma    WITH ASTHMA ATTACK   Fibroid 1980   GERD (gastroesophageal reflux disease)    Heart murmur    Hx gestational diabetes    Hyperlipidemia    Hypertension 2014    Inguinal hernia 1959   Malaise and fatigue 2014   Non-IgE mediated allergic asthma 2014   Obesity    Pelvic pain    Pregnancy, high-risk 1985   Stroke ([place]) 04/20/2021   (CVA) of right basal ganglia  Tonsillitis 1968   Uterine fibroid 1980   BP 120/71   Pulse 75   Temp 98.3 F (36.8 C) (Oral)   SpO2 93%   Opioid Risk Score:   Fall Risk Score:  `1  Depression screen PHQ 2/9  Depression screen [facility] 2/9 11/26/2020 08/12/2020 06/18/2019 03/27/2019 03/19/2019 01/09/2019 01/01/2019  Decreased Interest 0 0 0 0 0 0 0  Down, Depressed, Hopeless 0 0 0 0 0 0 0  PHQ - 2 Score 0 0 0 0 0 0 0  Altered sleeping - - - 0 - - -  Tired, decreased energy - - - 0 - - -  Change in appetite - - - 0 - - -  Feeling bad or failure about yourself  - - - 0 - - -  Trouble concentrating - - - 0 - - -  Moving slowly or fidgety/restless - - - 0 - - -  Suicidal thoughts - - - 0 - - -  PHQ-9 Score - - - 0 - - -  Some recent data might be hidden    Review of Systems  Musculoskeletal:  Positive for gait problem.       Hand pain  Neurological:  Positive for tremors, weakness and numbness.  All other systems reviewed and are negative.     Objective:   Physical Exam Awake, more alert than when last seen, in manual w/c; accompanied by husband, NAD MS: LUE- biceps 4/5, triceps 4-/5, WE 4+/5, grip 4/5, and FA- 3/5 RUE- biceps 4+/5, triceps 4/5, WE 4-/5, grip 2/5, FA 2/5 LLE- HF 4-/5, KE and KF 4-/5 DF- 4-/5 and PF 4-/5 RLE- HF 4+/5 KF/KF 4+/5 DF and PF 4+/5  No TTP currently on anterior or posterior L shoulder Cannot do I love you sign with R hand and barely can do peace sign- has to put fingers in place to do claw or fist.   Neuro: Drooling constantly tiny amount No hoffman's or clonus on LUE/LLE-  Decreased to light touch in R hand- steal syndrome? More numb than in hospital- same on ulnar and median sides- no difference on 4th digit both sides the same.       Assessment & Plan:    Pt is a 69 yr  old female with recent hx of L hemiparesis due to R basal ganglia infarct in 7/22; R hip bursitis; anxiety; LGIB; dCHF; on HD from ESRD; HTN, BMI of 34 and DM- A1c 5.2- here for f/u on stroke.   Will continue Ritalin 5 mg BID- for attention/initiation- #60- no refills- Check to see if they will allow 3 months supply.   2.  Keppra-500 mg nightly- # 90- 3 months supply.   3. No signs of spasticity- unlikely to develop. If no spasticity by 3 months, we are good; went over signs/symptoms- if she does, let me know.    4. Will schedule Dr Letta Pate to do EMG/NCS - on Plavix - off ASA; not on Coumadin. For R hand/RUE- due to concern about steal syndrome.   5. Call me or I will call to go over EMG results-   6. If Not using RUE fistula- I would get rid of it. But Raizy doesn't want more surgery. Dexterity is still poor.   7.  H/H- don't know when to finish- I can write for outpt PT and OT- call me when 1 week left- and I can send for outpt therapy- no SLP.   8. F/U in 3 months  I spent  a total of 41 minutes on visit- as detailed above.

## 2021-06-28 DIAGNOSIS — Z8701 Personal history of pneumonia (recurrent): Secondary | ICD-10-CM | POA: Diagnosis not present

## 2021-06-28 DIAGNOSIS — J45909 Unspecified asthma, uncomplicated: Secondary | ICD-10-CM | POA: Diagnosis not present

## 2021-06-28 DIAGNOSIS — I2510 Atherosclerotic heart disease of native coronary artery without angina pectoris: Secondary | ICD-10-CM | POA: Diagnosis not present

## 2021-06-28 DIAGNOSIS — E785 Hyperlipidemia, unspecified: Secondary | ICD-10-CM | POA: Diagnosis not present

## 2021-06-28 DIAGNOSIS — N186 End stage renal disease: Secondary | ICD-10-CM | POA: Diagnosis not present

## 2021-06-28 DIAGNOSIS — D509 Iron deficiency anemia, unspecified: Secondary | ICD-10-CM | POA: Diagnosis not present

## 2021-06-28 DIAGNOSIS — Z794 Long term (current) use of insulin: Secondary | ICD-10-CM | POA: Diagnosis not present

## 2021-06-28 DIAGNOSIS — I132 Hypertensive heart and chronic kidney disease with heart failure and with stage 5 chronic kidney disease, or end stage renal disease: Secondary | ICD-10-CM | POA: Diagnosis not present

## 2021-06-28 DIAGNOSIS — D631 Anemia in chronic kidney disease: Secondary | ICD-10-CM | POA: Diagnosis not present

## 2021-06-28 DIAGNOSIS — N2581 Secondary hyperparathyroidism of renal origin: Secondary | ICD-10-CM | POA: Diagnosis not present

## 2021-06-28 DIAGNOSIS — I69354 Hemiplegia and hemiparesis following cerebral infarction affecting left non-dominant side: Secondary | ICD-10-CM | POA: Diagnosis not present

## 2021-06-28 DIAGNOSIS — Z7982 Long term (current) use of aspirin: Secondary | ICD-10-CM | POA: Diagnosis not present

## 2021-06-28 DIAGNOSIS — Z7902 Long term (current) use of antithrombotics/antiplatelets: Secondary | ICD-10-CM | POA: Diagnosis not present

## 2021-06-28 DIAGNOSIS — Z9181 History of falling: Secondary | ICD-10-CM | POA: Diagnosis not present

## 2021-06-28 DIAGNOSIS — I081 Rheumatic disorders of both mitral and tricuspid valves: Secondary | ICD-10-CM | POA: Diagnosis not present

## 2021-06-28 DIAGNOSIS — Z7951 Long term (current) use of inhaled steroids: Secondary | ICD-10-CM | POA: Diagnosis not present

## 2021-06-28 DIAGNOSIS — Z992 Dependence on renal dialysis: Secondary | ICD-10-CM | POA: Diagnosis not present

## 2021-06-28 DIAGNOSIS — I5033 Acute on chronic diastolic (congestive) heart failure: Secondary | ICD-10-CM | POA: Diagnosis not present

## 2021-06-28 DIAGNOSIS — Z79891 Long term (current) use of opiate analgesic: Secondary | ICD-10-CM | POA: Diagnosis not present

## 2021-06-28 DIAGNOSIS — Z79899 Other long term (current) drug therapy: Secondary | ICD-10-CM | POA: Diagnosis not present

## 2021-06-28 DIAGNOSIS — E1122 Type 2 diabetes mellitus with diabetic chronic kidney disease: Secondary | ICD-10-CM | POA: Diagnosis not present

## 2021-06-29 ENCOUNTER — Telehealth: Payer: Self-pay

## 2021-06-29 ENCOUNTER — Telehealth: Payer: Medicare Other

## 2021-06-29 ENCOUNTER — Other Ambulatory Visit: Payer: Self-pay

## 2021-06-29 MED ORDER — MOMETASONE FURO-FORMOTEROL FUM 200-5 MCG/ACT IN AERO: 2.0000 | INHALATION_SPRAY | Freq: Two times a day (BID) | RESPIRATORY_TRACT | 2 refills | Status: DC

## 2021-06-29 MED ORDER — INSULIN GLARGINE 100 UNIT/ML SOLOSTAR PEN: 13.0000 [IU] | PEN_INJECTOR | Freq: Every day | SUBCUTANEOUS | 2 refills | Status: DC

## 2021-06-29 MED ORDER — ROSUVASTATIN CALCIUM 20 MG PO TABS: 20.0000 mg | ORAL_TABLET | Freq: Every day | ORAL | 2 refills | Status: DC

## 2021-06-29 MED ORDER — PANTOPRAZOLE SODIUM 40 MG PO TBEC: 40.0000 mg | DELAYED_RELEASE_TABLET | Freq: Every day | ORAL | 2 refills | Status: DC

## 2021-06-29 MED ORDER — CARVEDILOL 3.125 MG PO TABS: 3.1250 mg | ORAL_TABLET | Freq: Two times a day (BID) | ORAL | 0 refills | Status: DC

## 2021-06-29 MED ORDER — SENNOSIDES-DOCUSATE SODIUM 8.6-50 MG PO TABS: 2.0000 | ORAL_TABLET | Freq: Two times a day (BID) | ORAL | 2 refills | Status: DC

## 2021-06-29 NOTE — Telephone Encounter (Signed)
Prior auth was denied

## 2021-06-29 NOTE — Telephone Encounter (Signed)
  Care Management   Follow Up Note   06/29/2021 Name: Carolyn Russo MRN: [id number] DOB: 1950-11-01   Referred by: [name], MD Reason for referral : Chronic Care Management (RN CM Follow up call )   An unsuccessful telephone outreach was attempted today. The patient was referred to the case management team for assistance with care management and care coordination.   Follow Up Plan: A HIPPA compliant phone message was left for the patient providing contact information and requesting a return call.   Barb Merino, RN, BSN, CCM Care Management Coordinator [place] [facility]  Direct Phone: [phone]

## 2021-06-30 DIAGNOSIS — N186 End stage renal disease: Secondary | ICD-10-CM | POA: Diagnosis not present

## 2021-06-30 DIAGNOSIS — Z992 Dependence on renal dialysis: Secondary | ICD-10-CM | POA: Diagnosis not present

## 2021-06-30 DIAGNOSIS — D631 Anemia in chronic kidney disease: Secondary | ICD-10-CM | POA: Diagnosis not present

## 2021-06-30 DIAGNOSIS — D509 Iron deficiency anemia, unspecified: Secondary | ICD-10-CM | POA: Diagnosis not present

## 2021-06-30 DIAGNOSIS — N2581 Secondary hyperparathyroidism of renal origin: Secondary | ICD-10-CM | POA: Diagnosis not present

## 2021-07-01 DIAGNOSIS — E1122 Type 2 diabetes mellitus with diabetic chronic kidney disease: Secondary | ICD-10-CM | POA: Diagnosis not present

## 2021-07-01 DIAGNOSIS — I5033 Acute on chronic diastolic (congestive) heart failure: Secondary | ICD-10-CM | POA: Diagnosis not present

## 2021-07-01 DIAGNOSIS — I69354 Hemiplegia and hemiparesis following cerebral infarction affecting left non-dominant side: Secondary | ICD-10-CM | POA: Diagnosis not present

## 2021-07-01 DIAGNOSIS — N186 End stage renal disease: Secondary | ICD-10-CM | POA: Diagnosis not present

## 2021-07-01 DIAGNOSIS — D631 Anemia in chronic kidney disease: Secondary | ICD-10-CM | POA: Diagnosis not present

## 2021-07-01 DIAGNOSIS — I132 Hypertensive heart and chronic kidney disease with heart failure and with stage 5 chronic kidney disease, or end stage renal disease: Secondary | ICD-10-CM | POA: Diagnosis not present

## 2021-07-02 DIAGNOSIS — Z992 Dependence on renal dialysis: Secondary | ICD-10-CM | POA: Diagnosis not present

## 2021-07-02 DIAGNOSIS — D631 Anemia in chronic kidney disease: Secondary | ICD-10-CM | POA: Diagnosis not present

## 2021-07-02 DIAGNOSIS — D509 Iron deficiency anemia, unspecified: Secondary | ICD-10-CM | POA: Diagnosis not present

## 2021-07-02 DIAGNOSIS — N186 End stage renal disease: Secondary | ICD-10-CM | POA: Diagnosis not present

## 2021-07-02 DIAGNOSIS — N2581 Secondary hyperparathyroidism of renal origin: Secondary | ICD-10-CM | POA: Diagnosis not present

## 2021-07-05 DIAGNOSIS — N186 End stage renal disease: Secondary | ICD-10-CM | POA: Diagnosis not present

## 2021-07-05 DIAGNOSIS — N2581 Secondary hyperparathyroidism of renal origin: Secondary | ICD-10-CM | POA: Diagnosis not present

## 2021-07-05 DIAGNOSIS — Z992 Dependence on renal dialysis: Secondary | ICD-10-CM | POA: Diagnosis not present

## 2021-07-05 DIAGNOSIS — D631 Anemia in chronic kidney disease: Secondary | ICD-10-CM | POA: Diagnosis not present

## 2021-07-05 DIAGNOSIS — D509 Iron deficiency anemia, unspecified: Secondary | ICD-10-CM | POA: Diagnosis not present

## 2021-07-06 DIAGNOSIS — I5033 Acute on chronic diastolic (congestive) heart failure: Secondary | ICD-10-CM | POA: Diagnosis not present

## 2021-07-06 DIAGNOSIS — I132 Hypertensive heart and chronic kidney disease with heart failure and with stage 5 chronic kidney disease, or end stage renal disease: Secondary | ICD-10-CM | POA: Diagnosis not present

## 2021-07-06 DIAGNOSIS — N186 End stage renal disease: Secondary | ICD-10-CM | POA: Diagnosis not present

## 2021-07-06 DIAGNOSIS — E1122 Type 2 diabetes mellitus with diabetic chronic kidney disease: Secondary | ICD-10-CM | POA: Diagnosis not present

## 2021-07-06 DIAGNOSIS — I69354 Hemiplegia and hemiparesis following cerebral infarction affecting left non-dominant side: Secondary | ICD-10-CM | POA: Diagnosis not present

## 2021-07-06 DIAGNOSIS — D631 Anemia in chronic kidney disease: Secondary | ICD-10-CM | POA: Diagnosis not present

## 2021-07-07 DIAGNOSIS — D509 Iron deficiency anemia, unspecified: Secondary | ICD-10-CM | POA: Diagnosis not present

## 2021-07-07 DIAGNOSIS — N186 End stage renal disease: Secondary | ICD-10-CM | POA: Diagnosis not present

## 2021-07-07 DIAGNOSIS — Z992 Dependence on renal dialysis: Secondary | ICD-10-CM | POA: Diagnosis not present

## 2021-07-07 DIAGNOSIS — D631 Anemia in chronic kidney disease: Secondary | ICD-10-CM | POA: Diagnosis not present

## 2021-07-07 DIAGNOSIS — N2581 Secondary hyperparathyroidism of renal origin: Secondary | ICD-10-CM | POA: Diagnosis not present

## 2021-07-08 ENCOUNTER — Telehealth: Payer: Self-pay

## 2021-07-08 DIAGNOSIS — I5033 Acute on chronic diastolic (congestive) heart failure: Secondary | ICD-10-CM | POA: Diagnosis not present

## 2021-07-08 DIAGNOSIS — I69354 Hemiplegia and hemiparesis following cerebral infarction affecting left non-dominant side: Secondary | ICD-10-CM | POA: Diagnosis not present

## 2021-07-08 DIAGNOSIS — N186 End stage renal disease: Secondary | ICD-10-CM | POA: Diagnosis not present

## 2021-07-08 DIAGNOSIS — I132 Hypertensive heart and chronic kidney disease with heart failure and with stage 5 chronic kidney disease, or end stage renal disease: Secondary | ICD-10-CM | POA: Diagnosis not present

## 2021-07-08 DIAGNOSIS — E1122 Type 2 diabetes mellitus with diabetic chronic kidney disease: Secondary | ICD-10-CM | POA: Diagnosis not present

## 2021-07-08 DIAGNOSIS — D631 Anemia in chronic kidney disease: Secondary | ICD-10-CM | POA: Diagnosis not present

## 2021-07-08 NOTE — Telephone Encounter (Signed)
  Care Management   Follow Up Note   07/08/2021 Name: Carolyn Russo MRN: [id number] DOB: June 12, 1951   Referred by: [name], MD Reason for referral : Chronic Care Management (Inbound call from spouse )  Voice message received from Dr. [name] patient's spouse stating he is returning my call. Notified spouse of rescheduled RN Care Manager follow up call set for 07/12/21 @9  AM via my chart message. Encouraged Dr. [name] to contact this RN if this date/time will not work for him. The patient was referred to the case management team for assistance with care management and care coordination.   Follow Up Plan: Telephone follow up appointment with care management team member scheduled for: 07/12/21  Barb Merino, RN, BSN, CCM Care Management Coordinator [place] [facility]  Direct Phone: [phone]

## 2021-07-09 DIAGNOSIS — Z992 Dependence on renal dialysis: Secondary | ICD-10-CM | POA: Diagnosis not present

## 2021-07-09 DIAGNOSIS — D631 Anemia in chronic kidney disease: Secondary | ICD-10-CM | POA: Diagnosis not present

## 2021-07-09 DIAGNOSIS — N186 End stage renal disease: Secondary | ICD-10-CM | POA: Diagnosis not present

## 2021-07-09 DIAGNOSIS — N2581 Secondary hyperparathyroidism of renal origin: Secondary | ICD-10-CM | POA: Diagnosis not present

## 2021-07-09 DIAGNOSIS — D509 Iron deficiency anemia, unspecified: Secondary | ICD-10-CM | POA: Diagnosis not present

## 2021-07-12 ENCOUNTER — Telehealth: Payer: Medicare Other

## 2021-07-12 ENCOUNTER — Ambulatory Visit: Payer: Self-pay

## 2021-07-12 DIAGNOSIS — Z992 Dependence on renal dialysis: Secondary | ICD-10-CM

## 2021-07-12 DIAGNOSIS — D631 Anemia in chronic kidney disease: Secondary | ICD-10-CM | POA: Diagnosis not present

## 2021-07-12 DIAGNOSIS — D509 Iron deficiency anemia, unspecified: Secondary | ICD-10-CM | POA: Diagnosis not present

## 2021-07-12 DIAGNOSIS — I2510 Atherosclerotic heart disease of native coronary artery without angina pectoris: Secondary | ICD-10-CM

## 2021-07-12 DIAGNOSIS — N2581 Secondary hyperparathyroidism of renal origin: Secondary | ICD-10-CM | POA: Diagnosis not present

## 2021-07-12 DIAGNOSIS — I6381 Other cerebral infarction due to occlusion or stenosis of small artery: Secondary | ICD-10-CM

## 2021-07-12 DIAGNOSIS — N186 End stage renal disease: Secondary | ICD-10-CM

## 2021-07-12 DIAGNOSIS — I5033 Acute on chronic diastolic (congestive) heart failure: Secondary | ICD-10-CM

## 2021-07-12 DIAGNOSIS — E1165 Type 2 diabetes mellitus with hyperglycemia: Secondary | ICD-10-CM

## 2021-07-12 NOTE — Chronic Care Management (AMB) (Signed)
  Care Management   Follow Up Note   07/12/2021 Name: Carolyn Russo MRN: [id number] DOB: [birth year]   Referred by: [name], MD Reason for referral : Chronic Care Management (RN CM Follow up call - 3rd attempt )   Third unsuccessful telephone outreach was attempted today. The patient was referred to the case management team for assistance with care management and care coordination. The patient's primary care provider has been notified of our unsuccessful attempts to make or maintain contact with the patient. The care management team is pleased to engage with this patient at any time in the future should he/she be interested in assistance from the care management team.   Follow Up Plan: We have been unable to make contact with the patient for follow up. The care management team is available to follow up with the patient after provider conversation with the patient regarding recommendation for care management engagement and subsequent re-referral to the care management team.   Barb Merino, RN, BSN, CCM Care Management Coordinator [place] [facility]  Direct Phone: [phone]

## 2021-07-13 DIAGNOSIS — E1122 Type 2 diabetes mellitus with diabetic chronic kidney disease: Secondary | ICD-10-CM | POA: Diagnosis not present

## 2021-07-13 DIAGNOSIS — I69354 Hemiplegia and hemiparesis following cerebral infarction affecting left non-dominant side: Secondary | ICD-10-CM | POA: Diagnosis not present

## 2021-07-13 DIAGNOSIS — I5033 Acute on chronic diastolic (congestive) heart failure: Secondary | ICD-10-CM | POA: Diagnosis not present

## 2021-07-13 DIAGNOSIS — I132 Hypertensive heart and chronic kidney disease with heart failure and with stage 5 chronic kidney disease, or end stage renal disease: Secondary | ICD-10-CM | POA: Diagnosis not present

## 2021-07-13 DIAGNOSIS — D631 Anemia in chronic kidney disease: Secondary | ICD-10-CM | POA: Diagnosis not present

## 2021-07-13 DIAGNOSIS — N186 End stage renal disease: Secondary | ICD-10-CM | POA: Diagnosis not present

## 2021-07-14 DIAGNOSIS — N186 End stage renal disease: Secondary | ICD-10-CM | POA: Diagnosis not present

## 2021-07-14 DIAGNOSIS — D631 Anemia in chronic kidney disease: Secondary | ICD-10-CM | POA: Diagnosis not present

## 2021-07-14 DIAGNOSIS — D509 Iron deficiency anemia, unspecified: Secondary | ICD-10-CM | POA: Diagnosis not present

## 2021-07-14 DIAGNOSIS — N2581 Secondary hyperparathyroidism of renal origin: Secondary | ICD-10-CM | POA: Diagnosis not present

## 2021-07-14 DIAGNOSIS — Z992 Dependence on renal dialysis: Secondary | ICD-10-CM | POA: Diagnosis not present

## 2021-07-15 DIAGNOSIS — E11319 Type 2 diabetes mellitus with unspecified diabetic retinopathy without macular edema: Secondary | ICD-10-CM | POA: Diagnosis not present

## 2021-07-15 DIAGNOSIS — D631 Anemia in chronic kidney disease: Secondary | ICD-10-CM | POA: Diagnosis not present

## 2021-07-15 DIAGNOSIS — E1122 Type 2 diabetes mellitus with diabetic chronic kidney disease: Secondary | ICD-10-CM | POA: Diagnosis not present

## 2021-07-15 DIAGNOSIS — E7800 Pure hypercholesterolemia, unspecified: Secondary | ICD-10-CM | POA: Diagnosis not present

## 2021-07-15 DIAGNOSIS — I10 Essential (primary) hypertension: Secondary | ICD-10-CM | POA: Diagnosis not present

## 2021-07-15 DIAGNOSIS — I69354 Hemiplegia and hemiparesis following cerebral infarction affecting left non-dominant side: Secondary | ICD-10-CM | POA: Diagnosis not present

## 2021-07-15 DIAGNOSIS — I132 Hypertensive heart and chronic kidney disease with heart failure and with stage 5 chronic kidney disease, or end stage renal disease: Secondary | ICD-10-CM | POA: Diagnosis not present

## 2021-07-15 DIAGNOSIS — E1165 Type 2 diabetes mellitus with hyperglycemia: Secondary | ICD-10-CM | POA: Diagnosis not present

## 2021-07-15 DIAGNOSIS — I5033 Acute on chronic diastolic (congestive) heart failure: Secondary | ICD-10-CM | POA: Diagnosis not present

## 2021-07-15 DIAGNOSIS — Z992 Dependence on renal dialysis: Secondary | ICD-10-CM | POA: Diagnosis not present

## 2021-07-15 DIAGNOSIS — N186 End stage renal disease: Secondary | ICD-10-CM | POA: Diagnosis not present

## 2021-07-16 DIAGNOSIS — N186 End stage renal disease: Secondary | ICD-10-CM | POA: Diagnosis not present

## 2021-07-16 DIAGNOSIS — N2581 Secondary hyperparathyroidism of renal origin: Secondary | ICD-10-CM | POA: Diagnosis not present

## 2021-07-16 DIAGNOSIS — D631 Anemia in chronic kidney disease: Secondary | ICD-10-CM | POA: Diagnosis not present

## 2021-07-16 DIAGNOSIS — D509 Iron deficiency anemia, unspecified: Secondary | ICD-10-CM | POA: Diagnosis not present

## 2021-07-16 DIAGNOSIS — Z992 Dependence on renal dialysis: Secondary | ICD-10-CM | POA: Diagnosis not present

## 2021-07-19 DIAGNOSIS — N186 End stage renal disease: Secondary | ICD-10-CM | POA: Diagnosis not present

## 2021-07-19 DIAGNOSIS — D509 Iron deficiency anemia, unspecified: Secondary | ICD-10-CM | POA: Diagnosis not present

## 2021-07-19 DIAGNOSIS — Z992 Dependence on renal dialysis: Secondary | ICD-10-CM | POA: Diagnosis not present

## 2021-07-19 DIAGNOSIS — N2581 Secondary hyperparathyroidism of renal origin: Secondary | ICD-10-CM | POA: Diagnosis not present

## 2021-07-19 DIAGNOSIS — D631 Anemia in chronic kidney disease: Secondary | ICD-10-CM | POA: Diagnosis not present

## 2021-07-20 DIAGNOSIS — I5033 Acute on chronic diastolic (congestive) heart failure: Secondary | ICD-10-CM | POA: Diagnosis not present

## 2021-07-20 DIAGNOSIS — D631 Anemia in chronic kidney disease: Secondary | ICD-10-CM | POA: Diagnosis not present

## 2021-07-20 DIAGNOSIS — I132 Hypertensive heart and chronic kidney disease with heart failure and with stage 5 chronic kidney disease, or end stage renal disease: Secondary | ICD-10-CM | POA: Diagnosis not present

## 2021-07-20 DIAGNOSIS — N186 End stage renal disease: Secondary | ICD-10-CM | POA: Diagnosis not present

## 2021-07-20 DIAGNOSIS — I69354 Hemiplegia and hemiparesis following cerebral infarction affecting left non-dominant side: Secondary | ICD-10-CM | POA: Diagnosis not present

## 2021-07-20 DIAGNOSIS — E1122 Type 2 diabetes mellitus with diabetic chronic kidney disease: Secondary | ICD-10-CM | POA: Diagnosis not present

## 2021-07-21 ENCOUNTER — Encounter: Payer: Self-pay | Admitting: Gastroenterology

## 2021-07-21 DIAGNOSIS — N2581 Secondary hyperparathyroidism of renal origin: Secondary | ICD-10-CM | POA: Diagnosis not present

## 2021-07-21 DIAGNOSIS — D509 Iron deficiency anemia, unspecified: Secondary | ICD-10-CM | POA: Diagnosis not present

## 2021-07-21 DIAGNOSIS — N186 End stage renal disease: Secondary | ICD-10-CM | POA: Diagnosis not present

## 2021-07-21 DIAGNOSIS — Z992 Dependence on renal dialysis: Secondary | ICD-10-CM | POA: Diagnosis not present

## 2021-07-21 DIAGNOSIS — D631 Anemia in chronic kidney disease: Secondary | ICD-10-CM | POA: Diagnosis not present

## 2021-07-23 ENCOUNTER — Encounter: Payer: Self-pay | Admitting: Nurse Practitioner

## 2021-07-23 ENCOUNTER — Inpatient Hospital Stay: Payer: Medicare Other | Admitting: Registered Nurse

## 2021-07-23 DIAGNOSIS — E1165 Type 2 diabetes mellitus with hyperglycemia: Secondary | ICD-10-CM | POA: Diagnosis not present

## 2021-07-23 DIAGNOSIS — N186 End stage renal disease: Secondary | ICD-10-CM | POA: Diagnosis not present

## 2021-07-23 DIAGNOSIS — Z992 Dependence on renal dialysis: Secondary | ICD-10-CM | POA: Diagnosis not present

## 2021-07-23 DIAGNOSIS — D631 Anemia in chronic kidney disease: Secondary | ICD-10-CM | POA: Diagnosis not present

## 2021-07-23 DIAGNOSIS — N2581 Secondary hyperparathyroidism of renal origin: Secondary | ICD-10-CM | POA: Diagnosis not present

## 2021-07-23 DIAGNOSIS — I2510 Atherosclerotic heart disease of native coronary artery without angina pectoris: Secondary | ICD-10-CM | POA: Diagnosis not present

## 2021-07-23 DIAGNOSIS — D509 Iron deficiency anemia, unspecified: Secondary | ICD-10-CM | POA: Diagnosis not present

## 2021-07-23 DIAGNOSIS — I5033 Acute on chronic diastolic (congestive) heart failure: Secondary | ICD-10-CM | POA: Diagnosis not present

## 2021-07-24 DIAGNOSIS — E1122 Type 2 diabetes mellitus with diabetic chronic kidney disease: Secondary | ICD-10-CM | POA: Diagnosis not present

## 2021-07-24 DIAGNOSIS — N186 End stage renal disease: Secondary | ICD-10-CM | POA: Diagnosis not present

## 2021-07-24 DIAGNOSIS — Z992 Dependence on renal dialysis: Secondary | ICD-10-CM | POA: Diagnosis not present

## 2021-07-26 ENCOUNTER — Other Ambulatory Visit (INDEPENDENT_AMBULATORY_CARE_PROVIDER_SITE_OTHER): Payer: Medicare Other

## 2021-07-26 ENCOUNTER — Other Ambulatory Visit: Payer: Medicare Other

## 2021-07-26 DIAGNOSIS — R5383 Other fatigue: Secondary | ICD-10-CM

## 2021-07-26 DIAGNOSIS — Z9189 Other specified personal risk factors, not elsewhere classified: Secondary | ICD-10-CM | POA: Diagnosis not present

## 2021-07-26 LAB — POC COVID19 BINAXNOW: SARS Coronavirus 2 Ag: NEGATIVE

## 2021-07-26 NOTE — Addendum Note (Signed)
Addended by: Blanca Friend on: 07/26/2021 04:02 PM   Modules accepted: Orders

## 2021-07-27 DIAGNOSIS — I132 Hypertensive heart and chronic kidney disease with heart failure and with stage 5 chronic kidney disease, or end stage renal disease: Secondary | ICD-10-CM | POA: Diagnosis not present

## 2021-07-27 DIAGNOSIS — D631 Anemia in chronic kidney disease: Secondary | ICD-10-CM | POA: Diagnosis not present

## 2021-07-27 DIAGNOSIS — I5033 Acute on chronic diastolic (congestive) heart failure: Secondary | ICD-10-CM | POA: Diagnosis not present

## 2021-07-27 DIAGNOSIS — E1122 Type 2 diabetes mellitus with diabetic chronic kidney disease: Secondary | ICD-10-CM | POA: Diagnosis not present

## 2021-07-27 DIAGNOSIS — N186 End stage renal disease: Secondary | ICD-10-CM | POA: Diagnosis not present

## 2021-07-27 DIAGNOSIS — I69354 Hemiplegia and hemiparesis following cerebral infarction affecting left non-dominant side: Secondary | ICD-10-CM | POA: Diagnosis not present

## 2021-07-27 LAB — SARS-COV-2, NAA 2 DAY TAT

## 2021-07-27 LAB — NOVEL CORONAVIRUS, NAA: SARS-CoV-2, NAA: NOT DETECTED

## 2021-07-28 DIAGNOSIS — Z7902 Long term (current) use of antithrombotics/antiplatelets: Secondary | ICD-10-CM | POA: Diagnosis not present

## 2021-07-28 DIAGNOSIS — Z7951 Long term (current) use of inhaled steroids: Secondary | ICD-10-CM | POA: Diagnosis not present

## 2021-07-28 DIAGNOSIS — Z794 Long term (current) use of insulin: Secondary | ICD-10-CM | POA: Diagnosis not present

## 2021-07-28 DIAGNOSIS — I081 Rheumatic disorders of both mitral and tricuspid valves: Secondary | ICD-10-CM | POA: Diagnosis not present

## 2021-07-28 DIAGNOSIS — N2581 Secondary hyperparathyroidism of renal origin: Secondary | ICD-10-CM | POA: Diagnosis not present

## 2021-07-28 DIAGNOSIS — D509 Iron deficiency anemia, unspecified: Secondary | ICD-10-CM | POA: Diagnosis not present

## 2021-07-28 DIAGNOSIS — E785 Hyperlipidemia, unspecified: Secondary | ICD-10-CM | POA: Diagnosis not present

## 2021-07-28 DIAGNOSIS — N186 End stage renal disease: Secondary | ICD-10-CM | POA: Diagnosis not present

## 2021-07-28 DIAGNOSIS — Z79899 Other long term (current) drug therapy: Secondary | ICD-10-CM | POA: Diagnosis not present

## 2021-07-28 DIAGNOSIS — I2510 Atherosclerotic heart disease of native coronary artery without angina pectoris: Secondary | ICD-10-CM | POA: Diagnosis not present

## 2021-07-28 DIAGNOSIS — Z8701 Personal history of pneumonia (recurrent): Secondary | ICD-10-CM | POA: Diagnosis not present

## 2021-07-28 DIAGNOSIS — Z23 Encounter for immunization: Secondary | ICD-10-CM | POA: Diagnosis not present

## 2021-07-28 DIAGNOSIS — D631 Anemia in chronic kidney disease: Secondary | ICD-10-CM | POA: Diagnosis not present

## 2021-07-28 DIAGNOSIS — I5033 Acute on chronic diastolic (congestive) heart failure: Secondary | ICD-10-CM | POA: Diagnosis not present

## 2021-07-28 DIAGNOSIS — I132 Hypertensive heart and chronic kidney disease with heart failure and with stage 5 chronic kidney disease, or end stage renal disease: Secondary | ICD-10-CM | POA: Diagnosis not present

## 2021-07-28 DIAGNOSIS — M25531 Pain in right wrist: Secondary | ICD-10-CM | POA: Diagnosis not present

## 2021-07-28 DIAGNOSIS — J45909 Unspecified asthma, uncomplicated: Secondary | ICD-10-CM | POA: Diagnosis not present

## 2021-07-28 DIAGNOSIS — E1122 Type 2 diabetes mellitus with diabetic chronic kidney disease: Secondary | ICD-10-CM | POA: Diagnosis not present

## 2021-07-28 DIAGNOSIS — I69354 Hemiplegia and hemiparesis following cerebral infarction affecting left non-dominant side: Secondary | ICD-10-CM | POA: Diagnosis not present

## 2021-07-28 DIAGNOSIS — Z9181 History of falling: Secondary | ICD-10-CM | POA: Diagnosis not present

## 2021-07-28 DIAGNOSIS — Z992 Dependence on renal dialysis: Secondary | ICD-10-CM | POA: Diagnosis not present

## 2021-07-29 DIAGNOSIS — E1122 Type 2 diabetes mellitus with diabetic chronic kidney disease: Secondary | ICD-10-CM | POA: Diagnosis not present

## 2021-07-29 DIAGNOSIS — I5033 Acute on chronic diastolic (congestive) heart failure: Secondary | ICD-10-CM | POA: Diagnosis not present

## 2021-07-29 DIAGNOSIS — N186 End stage renal disease: Secondary | ICD-10-CM | POA: Diagnosis not present

## 2021-07-29 DIAGNOSIS — D631 Anemia in chronic kidney disease: Secondary | ICD-10-CM | POA: Diagnosis not present

## 2021-07-29 DIAGNOSIS — I69354 Hemiplegia and hemiparesis following cerebral infarction affecting left non-dominant side: Secondary | ICD-10-CM | POA: Diagnosis not present

## 2021-07-29 DIAGNOSIS — I132 Hypertensive heart and chronic kidney disease with heart failure and with stage 5 chronic kidney disease, or end stage renal disease: Secondary | ICD-10-CM | POA: Diagnosis not present

## 2021-07-30 ENCOUNTER — Encounter: Payer: Self-pay | Admitting: Internal Medicine

## 2021-07-30 DIAGNOSIS — N2581 Secondary hyperparathyroidism of renal origin: Secondary | ICD-10-CM | POA: Diagnosis not present

## 2021-07-30 DIAGNOSIS — D509 Iron deficiency anemia, unspecified: Secondary | ICD-10-CM | POA: Diagnosis not present

## 2021-07-30 DIAGNOSIS — Z992 Dependence on renal dialysis: Secondary | ICD-10-CM | POA: Diagnosis not present

## 2021-07-30 DIAGNOSIS — D631 Anemia in chronic kidney disease: Secondary | ICD-10-CM | POA: Diagnosis not present

## 2021-07-30 DIAGNOSIS — N186 End stage renal disease: Secondary | ICD-10-CM | POA: Diagnosis not present

## 2021-07-30 DIAGNOSIS — Z23 Encounter for immunization: Secondary | ICD-10-CM | POA: Diagnosis not present

## 2021-08-02 DIAGNOSIS — D631 Anemia in chronic kidney disease: Secondary | ICD-10-CM | POA: Diagnosis not present

## 2021-08-02 DIAGNOSIS — D509 Iron deficiency anemia, unspecified: Secondary | ICD-10-CM | POA: Diagnosis not present

## 2021-08-02 DIAGNOSIS — Z23 Encounter for immunization: Secondary | ICD-10-CM | POA: Diagnosis not present

## 2021-08-02 DIAGNOSIS — N186 End stage renal disease: Secondary | ICD-10-CM | POA: Diagnosis not present

## 2021-08-02 DIAGNOSIS — N2581 Secondary hyperparathyroidism of renal origin: Secondary | ICD-10-CM | POA: Diagnosis not present

## 2021-08-02 DIAGNOSIS — Z992 Dependence on renal dialysis: Secondary | ICD-10-CM | POA: Diagnosis not present

## 2021-08-03 DIAGNOSIS — N186 End stage renal disease: Secondary | ICD-10-CM | POA: Diagnosis not present

## 2021-08-03 DIAGNOSIS — I132 Hypertensive heart and chronic kidney disease with heart failure and with stage 5 chronic kidney disease, or end stage renal disease: Secondary | ICD-10-CM | POA: Diagnosis not present

## 2021-08-03 DIAGNOSIS — E1122 Type 2 diabetes mellitus with diabetic chronic kidney disease: Secondary | ICD-10-CM | POA: Diagnosis not present

## 2021-08-03 DIAGNOSIS — D631 Anemia in chronic kidney disease: Secondary | ICD-10-CM | POA: Diagnosis not present

## 2021-08-03 DIAGNOSIS — I5033 Acute on chronic diastolic (congestive) heart failure: Secondary | ICD-10-CM | POA: Diagnosis not present

## 2021-08-03 DIAGNOSIS — I69354 Hemiplegia and hemiparesis following cerebral infarction affecting left non-dominant side: Secondary | ICD-10-CM | POA: Diagnosis not present

## 2021-08-04 DIAGNOSIS — D509 Iron deficiency anemia, unspecified: Secondary | ICD-10-CM | POA: Diagnosis not present

## 2021-08-04 DIAGNOSIS — Z992 Dependence on renal dialysis: Secondary | ICD-10-CM | POA: Diagnosis not present

## 2021-08-04 DIAGNOSIS — Z23 Encounter for immunization: Secondary | ICD-10-CM | POA: Diagnosis not present

## 2021-08-04 DIAGNOSIS — N2581 Secondary hyperparathyroidism of renal origin: Secondary | ICD-10-CM | POA: Diagnosis not present

## 2021-08-04 DIAGNOSIS — N186 End stage renal disease: Secondary | ICD-10-CM | POA: Diagnosis not present

## 2021-08-04 DIAGNOSIS — D631 Anemia in chronic kidney disease: Secondary | ICD-10-CM | POA: Diagnosis not present

## 2021-08-05 DIAGNOSIS — I69354 Hemiplegia and hemiparesis following cerebral infarction affecting left non-dominant side: Secondary | ICD-10-CM | POA: Diagnosis not present

## 2021-08-05 DIAGNOSIS — N186 End stage renal disease: Secondary | ICD-10-CM | POA: Diagnosis not present

## 2021-08-05 DIAGNOSIS — I132 Hypertensive heart and chronic kidney disease with heart failure and with stage 5 chronic kidney disease, or end stage renal disease: Secondary | ICD-10-CM | POA: Diagnosis not present

## 2021-08-05 DIAGNOSIS — I5033 Acute on chronic diastolic (congestive) heart failure: Secondary | ICD-10-CM | POA: Diagnosis not present

## 2021-08-05 DIAGNOSIS — D631 Anemia in chronic kidney disease: Secondary | ICD-10-CM | POA: Diagnosis not present

## 2021-08-05 DIAGNOSIS — E1122 Type 2 diabetes mellitus with diabetic chronic kidney disease: Secondary | ICD-10-CM | POA: Diagnosis not present

## 2021-08-06 ENCOUNTER — Encounter: Payer: Medicare Other | Attending: Registered Nurse | Admitting: Physical Medicine & Rehabilitation

## 2021-08-06 ENCOUNTER — Encounter: Payer: Self-pay | Admitting: Physical Medicine & Rehabilitation

## 2021-08-06 ENCOUNTER — Other Ambulatory Visit: Payer: Self-pay

## 2021-08-06 VITALS — BP 159/78 | HR 77 | Temp 98.5°F | Ht 62.0 in

## 2021-08-06 DIAGNOSIS — T82898S Other specified complication of vascular prosthetic devices, implants and grafts, sequela: Secondary | ICD-10-CM | POA: Insufficient documentation

## 2021-08-06 DIAGNOSIS — N2581 Secondary hyperparathyroidism of renal origin: Secondary | ICD-10-CM | POA: Diagnosis not present

## 2021-08-06 DIAGNOSIS — M79641 Pain in right hand: Secondary | ICD-10-CM | POA: Diagnosis not present

## 2021-08-06 DIAGNOSIS — N186 End stage renal disease: Secondary | ICD-10-CM | POA: Diagnosis not present

## 2021-08-06 DIAGNOSIS — D509 Iron deficiency anemia, unspecified: Secondary | ICD-10-CM | POA: Diagnosis not present

## 2021-08-06 DIAGNOSIS — D631 Anemia in chronic kidney disease: Secondary | ICD-10-CM | POA: Diagnosis not present

## 2021-08-06 DIAGNOSIS — Z992 Dependence on renal dialysis: Secondary | ICD-10-CM | POA: Diagnosis not present

## 2021-08-06 DIAGNOSIS — Z23 Encounter for immunization: Secondary | ICD-10-CM | POA: Diagnosis not present

## 2021-08-06 DIAGNOSIS — T82898A Other specified complication of vascular prosthetic devices, implants and grafts, initial encounter: Secondary | ICD-10-CM | POA: Insufficient documentation

## 2021-08-06 NOTE — Progress Notes (Signed)
Please see media tab for full report

## 2021-08-09 ENCOUNTER — Ambulatory Visit: Payer: Medicare Other | Admitting: Physical Medicine and Rehabilitation

## 2021-08-09 DIAGNOSIS — N2581 Secondary hyperparathyroidism of renal origin: Secondary | ICD-10-CM | POA: Diagnosis not present

## 2021-08-09 DIAGNOSIS — Z23 Encounter for immunization: Secondary | ICD-10-CM | POA: Diagnosis not present

## 2021-08-09 DIAGNOSIS — Z992 Dependence on renal dialysis: Secondary | ICD-10-CM | POA: Diagnosis not present

## 2021-08-09 DIAGNOSIS — D631 Anemia in chronic kidney disease: Secondary | ICD-10-CM | POA: Diagnosis not present

## 2021-08-09 DIAGNOSIS — D509 Iron deficiency anemia, unspecified: Secondary | ICD-10-CM | POA: Diagnosis not present

## 2021-08-09 DIAGNOSIS — N186 End stage renal disease: Secondary | ICD-10-CM | POA: Diagnosis not present

## 2021-08-10 DIAGNOSIS — I5033 Acute on chronic diastolic (congestive) heart failure: Secondary | ICD-10-CM | POA: Diagnosis not present

## 2021-08-10 DIAGNOSIS — N186 End stage renal disease: Secondary | ICD-10-CM | POA: Diagnosis not present

## 2021-08-10 DIAGNOSIS — I132 Hypertensive heart and chronic kidney disease with heart failure and with stage 5 chronic kidney disease, or end stage renal disease: Secondary | ICD-10-CM | POA: Diagnosis not present

## 2021-08-10 DIAGNOSIS — D631 Anemia in chronic kidney disease: Secondary | ICD-10-CM | POA: Diagnosis not present

## 2021-08-10 DIAGNOSIS — I69354 Hemiplegia and hemiparesis following cerebral infarction affecting left non-dominant side: Secondary | ICD-10-CM | POA: Diagnosis not present

## 2021-08-10 DIAGNOSIS — E1122 Type 2 diabetes mellitus with diabetic chronic kidney disease: Secondary | ICD-10-CM | POA: Diagnosis not present

## 2021-08-11 ENCOUNTER — Telehealth: Payer: Self-pay

## 2021-08-11 ENCOUNTER — Ambulatory Visit: Payer: Medicare Other | Admitting: Podiatry

## 2021-08-11 DIAGNOSIS — N2581 Secondary hyperparathyroidism of renal origin: Secondary | ICD-10-CM | POA: Diagnosis not present

## 2021-08-11 DIAGNOSIS — D509 Iron deficiency anemia, unspecified: Secondary | ICD-10-CM | POA: Diagnosis not present

## 2021-08-11 DIAGNOSIS — N186 End stage renal disease: Secondary | ICD-10-CM | POA: Diagnosis not present

## 2021-08-11 DIAGNOSIS — D631 Anemia in chronic kidney disease: Secondary | ICD-10-CM | POA: Diagnosis not present

## 2021-08-11 DIAGNOSIS — Z23 Encounter for immunization: Secondary | ICD-10-CM | POA: Diagnosis not present

## 2021-08-11 DIAGNOSIS — Z992 Dependence on renal dialysis: Secondary | ICD-10-CM | POA: Diagnosis not present

## 2021-08-11 NOTE — Chronic Care Management (AMB) (Signed)
,  Chronic Care Management Pharmacy Assistant   Name: Carolyn Russo  MRN: [id number] DOB: 1950-12-02  Reason for Encounter: Chart review for CPP visit on 08/18/21.    Conditions to be addressed/monitored: CHF, CAD, HTN, HLD, DMII, ESRD, CKD Stage 5, Asthma, and Osteopenia  Recent office visits:  07/12/2021 Barb Merino RN CCM 06/24/2021 Daneen Schick LSCW CCM 06/22/2021 Daneen Schick LSCW CCM 06/14/2021 Minette Brine FNP patient present for follow up Cerebrovascular accident (CVA) of right basal ganglia ([place]), Hemiparesis affecting nondominant side as late effect of cerebrovascular accident (CVA) ([place]), Right arm weakness, Other insomnia, Nausea, ESRD (end stage renal disease) ([place]), Hemodialysis patient ([place]) Hemorrhoids, unspecified hemorrhoid type  Hydroxyzine HCL 25mg  1 tablet at bedtime prn, Phenylephrine in hard fat 0.25% 1 suppository rectally 2 times a day, Hydrocortisone 1% cream apply to affected area 2 times a day started 06/14/2021 Trazodone HCL 100mg  Discontinued Ambulatory referral to [place] Return if symptoms worsen or fail to improve.  05/20/2021- Kendra Humble LSCW CCM 04/23/2021- Kendra Humble LSCW CCM 04/16/2021- Kendra Humble LSCW CCM 04/15/2021- Tillie Rung Humble LSCW CCM 04/02/2021- Tillie Rung Humble LSCW CCM 04/02/2021- Barb Merino, RN CCM   04/01/2021- Bary Castilla, NP (PCP)- Patient presented for acute visit on Shortness of Breath, CKD (chronic kidney disease), stage V ([place]), Acute on chronic diastolic CHF (congestive heart failure) ([place]), Uncontrolled type 2 diabetes mellitus with hyperglycemia ([place]), Hypertension secondary to other renal disorders.   No Medications noted. Follow up: if symptoms persist or do not get better  03/30/2021- Barb Merino, RN CCM 03/30/2021- Daneen Schick, LSCW CCM     Recent consult visits:  08/06/2021 Delice Lesch MD ( Physical Medicine) EMG and Nerve Conduction study performed Return with Dr. Dagoberto Ligas No medication changes   06/25/2021  Courtney Heys MD ( Physical Medicine) Basal ganglia infarction (HCC),ESRD on dialysis [facility]), subclavian steal syndrome, Right hand weakness. Levetiracetam 500mg  1 tablet at bedtime for 30days started 06/25/21 Follow up 3 months  06/21/2021 Frann Rider NP ( Neurology) patient present for initial visit on Ischemic stroke (HCC),Seizure, late effect of stroke (HCC),Hemiparesis affecting left side as late effect of cerebrovascular accident [facility])  Vitamin C 500mg  1 tablet daily, Aspirin 81mg  1 tablet daily, B complex Vitamins 1 tablet daily, Vitamin D3 2,000 units daily Discontinued Follow up 3 months  06/09/2021 Laurence Slate PA-C ( Vascular Surgery) patient presented for follow up visit ESRD on dialysis [facility]) No medication changes noted Follow up prn  06/09/2021 Nino Glow MD (Cardiology) Duplex Dialysis Access procedure 04/30/2021- Deitra Mayo, MD - Right First Stage [place] [facility]. 04/18/2021- Silvano Rusk, MD - Esophagogastroduodenoscopy  04/14/2021- Deitra Mayo, MD ([facility])- Patient presented for follow up visit on Chronic kidney disease, stage V ([place]). Amoxicillin-Pot Clavulanate 500-125 mg discontinued due to completed course. No follow up visit noted.  04/08/2021- Jacelyn Pi, MD (Endocrinology) [facility] -Patient presented for follow up on Type 2 diabetes mellitus with hyperglycemia E11.65. No notes available   Hospital visits:  Medication Reconciliation was completed by comparing discharge summary, patient's EMR and Pharmacy list, and upon discussion with patient.  Admitted to the hospital on 05/07/2021 due to Basal ganglia infarction [facility]), Cerebrovascular accident (CVA) of right basal ganglia ([place]). Discharge date was 05/27/2021. Discharged from [place]?Medications Started at [facility] Discharge:?? -started: (feeding supplement) PROSource Plus  Aspirin Low Dose (aspirin)   Basaglar KwikPen  BD Pen Needle Nano U/F (Insulin Pen Needle)  diclofenac Sodium (VOLTAREN)  Dulera (mometasone-formoterol)  hydrALAZINE (APRESOLINE)  melatonin  methylphenidate (RITALIN)  polyethylene glycol powder (GLYCOLAX/MIRALAX)  traZODone (DESYREL)   due to Basal ganglia infarction [facility]), Cerebrovascular accident (CVA) of right basal ganglia ([place])  Medication Changes at Hospital Discharge: -Changed: albuterol (VENTOLIN HFA)    Medications Discontinued at Hospital Discharge: -Stopped: acetaminophen 500 MG tablet (TYLENOL)  FDGARD PO  sevelamer carbonate 800 MG tablet (RENVELA)  Symbicort 160-4.5 MCG/ACT inhaler (budesonide-formoterol)  due to Basal ganglia infarction [facility]), Cerebrovascular accident (CVA) of right basal ganglia ([place])  Medications that remain the same after Hospital Discharge:??  -All other medications will remain the same.    Patient admitted 05/06/2021 at [facility] and discharged 05/07/2021. Patient readmitted 05/07/2021.  Admitted to the hospital on 04/20/2021 due to Cerebrovascular accident (CVA) of right basal ganglia ([place]), ESRD on dialysis ([place]), Bandemia. Discharge date was 05/06/2021. Discharged from [place]?Medications Started at [facility] Discharge:?? -started : None  Medication Changes at Hospital Discharge: -Changed: None  Medications Discontinued at Hospital Discharge: -Stopped: Albuterol Sulfate 2.5 mg Nebulization, Furosemide 40 mg, Magnesium Oxide 400 mg, Tramadol 50 mg due to Cerebrovascular accident (CVA) of right basal ganglia ([place]), ESRD on dialysis ([place]), Bandemia.  Medications that remain the same after Hospital Discharge:??  -All other medications will remain the same.    Admitted to the hospital on 04/16/2021 due to CVA (cerebral vascular accident) [facility]). Discharge date was 04/20/2021. Discharged from [place]?Medications Started at [facility] Discharge:?? -started  None  Medication Changes at Hospital Discharge: -Changed: Hold Asa 81 mg- 1 tablet daily  Medications Discontinued at Hospital Discharge: -Stopped aspirin 81 MG tablet,  Resume ASA in approximately 5-7 days if no recurrence of lower GI bleeding.   docusate sodium 100 MG capsule Commonly known as: COLACE   NovoLOG Mix 70/30 FlexPen (70-30) 100 UNIT/ML FlexPen Generic drug: insulin aspart protamine - aspart   due to CVA (cerebral vascular accident) ([place])  Medications that remain the same after Hospital Discharge:??  -All other medications will remain the same.    Admitted to the hospital on 04/14/2021 due to Stroke-like Symptoms. Discharge date was 04/15/2021. Discharged from [facility].    New?Medications Started at [facility] Discharge:?? -started: None  Medication Changes at Hospital Discharge: -Changed: None  Medications Discontinued at Hospital Discharge: -Stopped: None  Medications that remain the same after Hospital Discharge:??  -All other medications will remain the same.   Pt leaving AMA, family stated they would return if symptoms worsen.   Admitted to the hospital on 03/09/2021 due to Acute on chronic diastolic CHF (congestive heart failure) ([place]), Type 2 diabetes mellitus ([place]), CAD (coronary artery disease), native coronary artery, Asthma, Anemia in chronic kidney disease, CKD (chronic kidney disease), stage V ([place]), Hypertensive urgency. Discharge date was 03/24/2021. Discharged from [place]?Medications Started at [facility] Discharge:?? -started furosemide 80 MG tablet- take 1 tablet Tuesday, Thursday, and Saturday due to Acute on chronic diastolic CHF (congestive heart failure)  Medication Changes at Hospital Discharge: -Changed furosemide 40 MG tablet- Take 1-2 tablets (40-80 mg total) by mouth every Monday, Wednesday, and Friday. 80 mg twice a week. 40 mg all other days. NovoLOG Mix 70/30 FlexPen (70-30) 100  UNIT/ML FlexPen- Inject 1 mL (100 Units total) into the skin 2 (two) times daily with a meal. 20 units in the morning and 15 units at night. polyethylene glycol 17 g packet- Take 17 g by mouth daily.  Medications Discontinued at Hospital Discharge: -Stopped valsartan-hydrochlorothiazide 320-12.5 MG tablet due to Acute on chronic diastolic CHF (congestive heart failure)  Medications that remain the same after Hospital Discharge:??  -All other medications will remain the same.    Medications: Outpatient Encounter Medications as of 08/11/2021  Medication Sig Note   albuterol (VENTOLIN HFA) 108 (90 Base) MCG/ACT inhaler Inhale 2 puffs into the lungs every 4 (four) hours as needed for wheezing or shortness of breath.    BESIVANCE 0.6 % SUSP Place 1 drop into both eyes See admin instructions. After  eye injections    carvedilol (COREG) 3.125 MG tablet Take 1 tablet (3.125 mg total) by mouth 2 (two) times daily with a meal.    clopidogrel (PLAVIX) 75 MG tablet Take 1 tablet (75 mg total) by mouth daily.    Continuous Blood Gluc Receiver (FREESTYLE LIBRE 2 READER) DEVI by Does not apply route.    diclofenac Sodium (VOLTAREN) 1 % GEL Apply 2 g topically 4 (four) times daily. To left wrist    diphenhydrAMINE (BENADRYL) 25 MG tablet Take 25 mg by mouth every 6 (six) hours as needed for itching.    fluticasone (FLONASE) 50 MCG/ACT nasal spray Place 2 sprays into the nose daily as needed for allergies.     hydrALAZINE (APRESOLINE) 10 MG tablet Take 1 tablet (10 mg total) by mouth every 8 (eight) hours as needed (Sys BP >180, hold on HD days).    hydrocortisone (ANUSOL-HC) 2.5 % rectal cream Place 1 application rectally 2 (two) times daily as needed for hemorrhoids or anal itching.    hydrocortisone cream 1 % Apply to affected area 2 times daily    hydrOXYzine (ATARAX/VISTARIL) 25 MG tablet Take 1 tablet (25 mg total) by mouth at bedtime as needed.    hydrOXYzine (VISTARIL) 25 MG capsule Take 25 mg by mouth  3 (three) times daily as needed. Dose unknown    insulin glargine (LANTUS) 100 UNIT/ML Solostar Pen Inject 13 Units into the skin daily.    Insulin Pen Needle (BD PEN NEEDLE NANO U/F) 32G X 4 MM MISC Use daily as directed.    levETIRAcetam (KEPPRA) 500 MG tablet Take 1 tablet (500 mg total) by mouth at bedtime.    melatonin 3 MG TABS tablet Take 1 tablet (3 mg total) by mouth at bedtime.    methylphenidate (RITALIN) 5 MG tablet Take 1 tablet (5 mg total) by mouth 2 (two) times daily with breakfast and lunch.    mometasone-formoterol (DULERA) 200-5 MCG/ACT AERO Inhale 2 puffs into the lungs 2 (two) times daily.    Nutritional Supplements (,FEEDING SUPPLEMENT, PROSOURCE PLUS) liquid Take 30 mLs by mouth 2 (two) times daily between meals.    ondansetron (ZOFRAN) 4 MG tablet Take 1 tablet (4 mg total) by mouth every 6 (six) hours as needed for nausea or vomiting.    OneTouch Delica Lancets 76L MISC Use as directed to check blood sugars before breakfast and dinner dx: e11.65    pantoprazole (PROTONIX) 40 MG tablet Take 1 tablet (40 mg total) by mouth daily.    phenylephrine (,USE FOR PREPARATION-H,) 0.25 % suppository Place 1 suppository rectally 2 (two) times daily.    polyethylene glycol powder (GLYCOLAX/MIRALAX) 17 GM/SCOOP powder Take 17 g by mouth daily. 06/21/2021: As needed   rosuvastatin (CRESTOR) 20 MG tablet Take 1 tablet (20 mg total) by mouth daily.    senna-docusate (SENOKOT-S) 8.6-50 MG tablet Take 2 tablets by mouth 2 (two) times daily.    [  DISCONTINUED] iron polysaccharides (NIFEREX) 150 MG capsule Take 1 capsule (150 mg total) by mouth daily. (Patient not taking: No sig reported)    No facility-administered encounter medications on file as of 08/11/2021.    Care Gaps: Pneumonia Vaccine 9+ Years old- Overdue - never done  Zoster Vaccines- Shingrix (1 of 2)- never done   DEXA SCAN (Once)- never done  TETANUS/TDAP (Every 10 Years)- Last completed: Jul 23, 2008 COVID-19 Vaccine (3 -  Pfizer risk series)- Last completed: Jan 07, 2020 OPHTHALMOLOGY EXAM (Yearly)- Last completed: Nov 18, 2019 COLONOSCOPY (Pts 45-12yrs Insurance coverage will need to be confirmed) (Every 3 Years)- Last completed: Apr 10, 2018 INFLUENZA VACCINE (Every 8 Months, August to March)- Last completed: Aug 12, 2020 Mammogram- Last completed 08/03/2020 Annual Wellness visit scheduled for 08/18/2021.  Star Rating Drugs: Rosuvastatin 20 mg- Last filled 06/29/2021 for 90 day supply at [place]  Note: Initial questions not asked, called patient, no answer, left message of telephone appointment, date and time.  Pattricia Boss, [place] Pharmacist Assistant [phone]

## 2021-08-13 DIAGNOSIS — N186 End stage renal disease: Secondary | ICD-10-CM | POA: Diagnosis not present

## 2021-08-13 DIAGNOSIS — Z23 Encounter for immunization: Secondary | ICD-10-CM | POA: Diagnosis not present

## 2021-08-13 DIAGNOSIS — Z992 Dependence on renal dialysis: Secondary | ICD-10-CM | POA: Diagnosis not present

## 2021-08-13 DIAGNOSIS — D631 Anemia in chronic kidney disease: Secondary | ICD-10-CM | POA: Diagnosis not present

## 2021-08-13 DIAGNOSIS — N2581 Secondary hyperparathyroidism of renal origin: Secondary | ICD-10-CM | POA: Diagnosis not present

## 2021-08-13 DIAGNOSIS — D509 Iron deficiency anemia, unspecified: Secondary | ICD-10-CM | POA: Diagnosis not present

## 2021-08-16 DIAGNOSIS — N186 End stage renal disease: Secondary | ICD-10-CM | POA: Diagnosis not present

## 2021-08-16 DIAGNOSIS — Z992 Dependence on renal dialysis: Secondary | ICD-10-CM | POA: Diagnosis not present

## 2021-08-16 DIAGNOSIS — N2581 Secondary hyperparathyroidism of renal origin: Secondary | ICD-10-CM | POA: Diagnosis not present

## 2021-08-16 DIAGNOSIS — Z23 Encounter for immunization: Secondary | ICD-10-CM | POA: Diagnosis not present

## 2021-08-16 DIAGNOSIS — D631 Anemia in chronic kidney disease: Secondary | ICD-10-CM | POA: Diagnosis not present

## 2021-08-16 DIAGNOSIS — D509 Iron deficiency anemia, unspecified: Secondary | ICD-10-CM | POA: Diagnosis not present

## 2021-08-18 ENCOUNTER — Ambulatory Visit: Payer: Medicare Other

## 2021-08-18 ENCOUNTER — Ambulatory Visit: Payer: Medicare Other | Admitting: Internal Medicine

## 2021-08-18 ENCOUNTER — Ambulatory Visit (INDEPENDENT_AMBULATORY_CARE_PROVIDER_SITE_OTHER): Payer: Medicare Other

## 2021-08-18 ENCOUNTER — Other Ambulatory Visit: Payer: Self-pay

## 2021-08-18 DIAGNOSIS — I132 Hypertensive heart and chronic kidney disease with heart failure and with stage 5 chronic kidney disease, or end stage renal disease: Secondary | ICD-10-CM | POA: Diagnosis not present

## 2021-08-18 DIAGNOSIS — E7800 Pure hypercholesterolemia, unspecified: Secondary | ICD-10-CM

## 2021-08-18 DIAGNOSIS — D509 Iron deficiency anemia, unspecified: Secondary | ICD-10-CM | POA: Diagnosis not present

## 2021-08-18 DIAGNOSIS — Z992 Dependence on renal dialysis: Secondary | ICD-10-CM | POA: Diagnosis not present

## 2021-08-18 DIAGNOSIS — N186 End stage renal disease: Secondary | ICD-10-CM | POA: Diagnosis not present

## 2021-08-18 DIAGNOSIS — Z23 Encounter for immunization: Secondary | ICD-10-CM | POA: Diagnosis not present

## 2021-08-18 DIAGNOSIS — I69354 Hemiplegia and hemiparesis following cerebral infarction affecting left non-dominant side: Secondary | ICD-10-CM | POA: Diagnosis not present

## 2021-08-18 DIAGNOSIS — N2581 Secondary hyperparathyroidism of renal origin: Secondary | ICD-10-CM | POA: Diagnosis not present

## 2021-08-18 DIAGNOSIS — E1165 Type 2 diabetes mellitus with hyperglycemia: Secondary | ICD-10-CM

## 2021-08-18 DIAGNOSIS — E1122 Type 2 diabetes mellitus with diabetic chronic kidney disease: Secondary | ICD-10-CM | POA: Diagnosis not present

## 2021-08-18 DIAGNOSIS — I5033 Acute on chronic diastolic (congestive) heart failure: Secondary | ICD-10-CM | POA: Diagnosis not present

## 2021-08-18 DIAGNOSIS — D631 Anemia in chronic kidney disease: Secondary | ICD-10-CM | POA: Diagnosis not present

## 2021-08-18 MED ORDER — CARVEDILOL 3.125 MG PO TABS: 3.1250 mg | ORAL_TABLET | Freq: Two times a day (BID) | ORAL | 0 refills | Status: DC

## 2021-08-18 MED ORDER — INSULIN GLARGINE 100 UNIT/ML SOLOSTAR PEN: 13.0000 [IU] | PEN_INJECTOR | Freq: Every day | SUBCUTANEOUS | 2 refills | Status: DC

## 2021-08-18 MED ORDER — ONETOUCH DELICA LANCETS 33G MISC: 11 refills | Status: DC

## 2021-08-18 MED ORDER — ALBUTEROL SULFATE HFA 108 (90 BASE) MCG/ACT IN AERS: 2.0000 | INHALATION_SPRAY | RESPIRATORY_TRACT | 3 refills | Status: DC | PRN

## 2021-08-18 MED ORDER — PANTOPRAZOLE SODIUM 40 MG PO TBEC: 40.0000 mg | DELAYED_RELEASE_TABLET | Freq: Every day | ORAL | 2 refills | Status: DC

## 2021-08-18 MED ORDER — POLYETHYLENE GLYCOL 3350 17 GM/SCOOP PO POWD: 17.0000 g | Freq: Every day | ORAL | 0 refills | Status: DC

## 2021-08-18 MED ORDER — MOMETASONE FURO-FORMOTEROL FUM 200-5 MCG/ACT IN AERO: 2.0000 | INHALATION_SPRAY | Freq: Two times a day (BID) | RESPIRATORY_TRACT | 2 refills | Status: DC

## 2021-08-18 MED ORDER — ROSUVASTATIN CALCIUM 20 MG PO TABS: 20.0000 mg | ORAL_TABLET | Freq: Every day | ORAL | 2 refills | Status: DC

## 2021-08-18 NOTE — Progress Notes (Signed)
Chronic Care Management Pharmacy Note  08/19/2021 Name:  Carolyn Russo MRN:  [id number] DOB:  05/14/1951  Summary: Patient is currently in dialysis treatment, and there are questions about the insulin the patient is taking.   Recommendations/Changes made from today's visit: Recommend patients medication be placed in adherence packaging.  Plan: Patients husband and I will have another appointment to discuss patients medications and questions about insulin.    Subjective: Carolyn Russo is an 70 y.o. year old female who is a primary patient of Carolyn Chard, MD.  The CCM team was consulted for assistance with disease management and care coordination needs.    Engaged with patient by telephone for initial visit in response to provider referral for pharmacy case management and/or care coordination services. 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 They are originally from [place]. Mr. Redditt is an opthamologist. They have been married for over 95 years. Dr. [name] reports that he would like to review the medications closely. He reports that [name] is on dialysis to help prevent her BP dropping. She is also taking another BP pill that she takes if it is over 180. She also on a sliding scale with a short acting insulin. Dr. [name] reports that an aide watches his wife when he is at work.   Consent to Services:  The patient was given the following information about Chronic Care Management services today, agreed to services, and gave verbal consent: 1. CCM service includes personalized support from designated clinical staff supervised by the primary care provider, including individualized plan of care and coordination with other care providers 2. 24/7 contact phone numbers for assistance for urgent and routine care  needs. 3. Service will only be billed when office clinical staff spend 20 minutes or more in a month to coordinate care. 4. Only one practitioner may furnish and bill the service in a calendar month. 5.The patient may stop CCM services at any time (effective at the end of the month) by phone call to the office staff. 6. The patient will be responsible for cost sharing (co-pay) of up to 20% of the service fee (after annual deductible is met). Patient agreed to services and consent obtained.  Patient Care Team: Carolyn Chard, MD as PCP - General (Internal Medicine) Carolyn Crome, MD as PCP - Cardiology (Cardiology) Carolyn Russo, [facility] (Pharmacist) Center, [facility]  Recent office visits:  07/12/2021 Carolyn Merino RN CCM 06/24/2021 Carolyn Russo LSCW CCM 06/22/2021 Carolyn Russo LSCW CCM 06/14/2021 Carolyn Brine FNP patient present for follow up Cerebrovascular accident (CVA) of right basal ganglia ([place]), Hemiparesis affecting nondominant side as late effect of cerebrovascular accident (CVA) ([place]), Right arm weakness, Other insomnia, Nausea, ESRD (end stage renal disease) ([place]), Hemodialysis patient ([place]) Hemorrhoids, unspecified hemorrhoid type  Hydroxyzine HCL 24m 1 tablet at bedtime prn, Phenylephrine in hard fat 0.25% 1 suppository rectally 2 times a day, Hydrocortisone 1% cream apply to affected area 2 times a day started 06/14/2021 Trazodone HCL 1060mDiscontinued [facility]ulatory [facility] if symptoms worsen or fail to improve.   05/20/2021- Carolyn Russo LSCW CCM 04/23/2021- Carolyn Russo LSCW CCM 04/16/2021- Carolyn Russo LSCW CCM 04/15/2021- KeTillie Russo LSCW CCM 04/02/2021- KeTillie Russo LSCW CCM 04/02/2021- AnBarb MerinoRN CCM     04/01/2021- RaBary CastillaNP (PCP)- Patient presented for  acute visit on Shortness of Breath, CKD (chronic kidney disease), stage V ([place]), Acute on chronic diastolic CHF (congestive heart failure) ([place]), Uncontrolled type 2 diabetes  mellitus with hyperglycemia ([place]), Hypertension secondary to other renal disorders.   No Medications noted. Follow up: if symptoms persist or do not get better   03/30/2021- Carolyn Merino, RN CCM 03/30/2021- Carolyn Russo, LSCW CCM       Recent consult visits:  08/06/2021 Carolyn Lesch MD ( Physical Medicine) EMG and Nerve Conduction study performed Return with Dr. Dagoberto Russo No medication changes    06/25/2021 Carolyn Heys MD ( Physical Medicine) Basal ganglia infarction (HCC),ESRD on dialysis [facility]), subclavian steal syndrome, Right hand weakness. Levetiracetam 557m 1 tablet at bedtime for 30days started 06/25/21 Follow up 3 months   06/21/2021 Carolyn RiderNP ( Neurology) patient present for initial visit on Ischemic stroke (HCC),Seizure, late effect of stroke (HCC),Hemiparesis affecting left side as late effect of cerebrovascular accident ([facility]  Vitamin C 5044m1 tablet daily, Aspirin 8138m tablet daily, B complex Vitamins 1 tablet daily, Vitamin D3 2,000 units daily Discontinued Follow up 3 months   06/09/2021 Carolyn Russo ( Vascular Surgery) patient presented for follow up visit ESRD on dialysis ([facility] medication changes noted Follow up prn   06/09/2021 ChrNino Russo (Cardiology) Duplex Dialysis Access procedure 04/30/2021- Carolyn Russo - Right First Stage BasRiversidein Transposition Surgery. 04/18/2021- Carolyn Russo - Esophagogastroduodenoscopy   04/14/2021- Carolyn Russo ([facility])- Patient presented for follow up visit on Chronic kidney disease, stage V (HCCLinneusAmoxicillin-Pot Clavulanate 500-125 mg discontinued due to completed course. No follow up visit noted.   04/08/2021- BinJacelyn Russo (Endocrinology) [facility] LPatient presented for follow up on Type 2 diabetes mellitus with hyperglycemia E11.65. No notes available     Hospital visits:  Medication Reconciliation was completed by comparing  discharge summary, patient's EMR and Pharmacy list, and upon discussion with patient.   Admitted to the hospital on 05/07/2021 due to Basal ganglia infarction ([facility]erebrovascular accident (CVA) of right basal ganglia (HCCCameronDischarge date was 05/27/2021. Discharged from [facility]ications [facility]:?? -started: (feeding supplement) PROSource Plus  Aspirin Low Dose (aspirin)  Basaglar KwikPen  BD Pen Needle Nano U/F (Insulin Pen Needle)  diclofenac Sodium (VOLTAREN)  Dulera (mometasone-formoterol)  hydrALAZINE (APRESOLINE)  melatonin  methylphenidate (RITALIN)  polyethylene glycol powder (GLYCOLAX/MIRALAX)  traZODone (DESYREL)   due to Basal ganglia infarction ([facility] HospitalCerebrovascular accident (CVA) of right basal ganglia (HCCHibbing Medication Changes at Hospital Discharge: -Changed: albuterol (VENTOLIN HFA)      Medications Discontinued at Hospital Discharge: -Stopped: acetaminophen 500 MG tablet (TYLENOL)  FDGARD PO  sevelamer carbonate 800 MG tablet (RENVELA)  Symbicort 160-4.5 MCG/ACT inhaler (budesonide-formoterol)  due to Basal ganglia infarction (HCWell[facility] HospitalCerebrovascular accident (CVA) of right basal ganglia (HCCGarland Medications that remain the same after Hospital Discharge:??  -All other medications will remain the same.     [facility]. Patient readmitted 05/07/2021.   Admitted to the hospital on 04/20/2021 due to Cerebrovascular accident (CVA) of right basal ganglia (HCCOzarkESRD on dialysis (HCCPontiacBandemia. Discharge date was 05/06/2021. Discharged from MosSenoiadications [facility]?? -started : None   Medication Changes at Hospital Discharge: -Changed: None   Medications Discontinued at Hospital Discharge: -Stopped: Albuterol Sulfate 2.5 mg Nebulization, Furosemide 40 mg, Magnesium  Oxide 400 mg, Tramadol 50 mg due to  Cerebrovascular accident (CVA) of right basal ganglia ([place]), ESRD on dialysis ([place]), Bandemia.   Medications that remain the same after Hospital Discharge:??  -All other medications will remain the same.     Admitted to the hospital on 04/16/2021 due to CVA (cerebral vascular accident) [facility]). Discharge date was 04/20/2021. Discharged from [place]?Medications Started at [facility] Discharge:?? -started None   Medication Changes at Hospital Discharge: -Changed: Hold Asa 81 mg- 1 tablet daily   Medications Discontinued at Hospital Discharge: -Stopped aspirin 81 MG tablet,  Resume ASA in approximately 5-7 days if no recurrence of lower GI bleeding.   docusate sodium 100 MG capsule Commonly known as: COLACE   NovoLOG Mix 70/30 FlexPen (70-30) 100 UNIT/ML FlexPen Generic drug: insulin aspart protamine - aspart    due to CVA (cerebral vascular accident) ([place])   Medications that remain the same after Hospital Discharge:??  -All other medications will remain the same.     Admitted to the hospital on 04/14/2021 due to Stroke-like Symptoms. Discharge date was 04/15/2021. Discharged from [facility].     New?[facility]ications [facility]charge:?? -started: None   Medication Changes at Hospital Discharge: -Changed: None   Medications Discontinued at Hospital Discharge: -Stopped: None   Medications that remain the same after Hospital Discharge:??  -All other medications will remain the same.   Pt leaving AMA, family stated they would return if symptoms worsen.     Admitted to the hospital on 03/09/2021 due to Acute on chronic diastolic CHF (congestive heart failure) ([place]), Type 2 diabetes mellitus ([place]), CAD (coronary artery disease), native coronary artery, Asthma, Anemia in chronic kidney disease, CKD (chronic kidney disease), stage V ([place]), Hypertensive urgency. Discharge date was 03/24/2021. Discharged from [place]?Medications Started at [facility] Discharge:?? -started furosemide 80 MG tablet- take 1 tablet Tuesday, Thursday, and Saturday due to Acute on chronic diastolic CHF (congestive heart failure)   Medication Changes at Hospital Discharge: -Changed furosemide 40 MG tablet- Take 1-2 tablets (40-80 mg total) by mouth every Monday, Wednesday, and Friday. 80 mg twice a week. 40 mg all other days. NovoLOG Mix 70/30 FlexPen (70-30) 100 UNIT/ML FlexPen- Inject 1 mL (100 Units total) into the skin 2 (two) times daily with a meal. 20 units in the morning and 15 units at night. polyethylene glycol 17 g packet- Take 17 g by mouth daily.   Medications Discontinued at Hospital Discharge: -Stopped valsartan-hydrochlorothiazide 320-12.5 MG tablet due to Acute on chronic diastolic CHF (congestive heart failure)   Medications that remain the same after Hospital Discharge:??  -All other medications will remain the same.     Objective:  Lab Results  Component Value Date   CREATININE 5.29 (H) 05/26/2021   BUN 22 05/26/2021   GFRNONAA 8 (L) 05/26/2021   GFRAA 17 06/19/2020   NA 129 (L) 05/26/2021   K 4.8 05/26/2021   CALCIUM 9.3 05/26/2021   CO2 24 05/26/2021   GLUCOSE 186 (H) 05/26/2021    Lab Results  Component Value Date/Time   HGBA1C 5.2 04/16/2021 06:40 PM   HGBA1C 6.2 (H) 02/17/2021 11:46 AM   HGBA1C 8.6 12/22/2020 12:00 AM   HGBA1C 9.6 06/19/2020 12:00 AM   MICROALBUR 150 03/27/2019 01:37 PM   MICROALBUR 1.41 10/13/2011 09:03 AM    Last diabetic Eye exam:  Lab Results  Component Value Date/Time   HMDIABEYEEXA  Retinopathy (A) 11/18/2019 12:00 AM    Last diabetic Foot exam: No results found for: HMDIABFOOTEX   Lab Results  Component Value Date   CHOL 135 04/16/2021   HDL 47 04/16/2021   LDLCALC 70 04/16/2021   TRIG 89 04/16/2021   CHOLHDL 2.9 04/16/2021    Hepatic Function Latest Ref Rng & Units 05/26/2021 05/24/2021 05/21/2021  Total Protein 6.5 - 8.1 g/dL - - -   Albumin 3.5 - 5.0 g/dL 3.4(L) 3.4(L) 3.2(L)  AST 15 - 41 U/L - - -  ALT 0 - 44 U/L - - -  Alk Phosphatase 38 - 126 U/L - - -  Total Bilirubin 0.3 - 1.2 mg/dL - - -  Bilirubin, Direct 0.00 - 0.40 mg/dL - - -    Lab Results  Component Value Date/Time   TSH 1.353 10/13/2011 09:03 AM    CBC Latest Ref Rng & Units 05/26/2021 05/24/2021 05/21/2021  WBC 4.0 - 10.5 K/uL 10.3 11.8(H) 8.5  Hemoglobin 12.0 - 15.0 g/dL 10.8(L) 10.6(L) 9.8(L)  Hematocrit 36.0 - 46.0 % 31.9(L) 30.2(L) 29.5(L)  Platelets 150 - 400 K/uL 348 325 297    Lab Results  Component Value Date/Time   VD25OH 37 10/13/2011 09:03 AM    Clinical ASCVD: Yes  The ASCVD Risk score (Arnett DK, et al., 2019) failed to calculate for the following reasons:   The patient has a prior MI or stroke diagnosis    Depression screen [facility] 2/9 06/25/2021 11/26/2020 08/12/2020  Decreased Interest 1 0 0  Down, Depressed, Hopeless 1 0 0  PHQ - 2 Score 2 0 0  Altered sleeping 0 - -  Tired, decreased energy 3 - -  Change in appetite 0 - -  Feeling bad or failure about yourself  0 - -  Trouble concentrating 0 - -  Moving slowly or fidgety/restless 0 - -  Suicidal thoughts 0 - -  PHQ-9 Score 5 - -  Difficult doing work/chores Somewhat difficult - -  Some recent data might be hidden      Social History   Tobacco Use  Smoking Status Never  Smokeless Tobacco Never   BP Readings from Last 3 Encounters:  08/06/21 (!) 159/78  06/25/21 120/71  06/21/21 (!) 144/80   Pulse Readings from Last 3 Encounters:  08/06/21 77  06/25/21 75  06/21/21 82   Wt Readings from Last 3 Encounters:  05/27/21 171 lb 4.8 oz (77.7 kg)  05/07/21 176 lb 5.9 oz (80 kg)  05/06/21 173 lb 1 oz (78.5 kg)   BMI Readings from Last 3 Encounters:  08/06/21 31.33 kg/m  05/27/21 31.33 kg/m  05/07/21 32.26 kg/m    Assessment/Interventions: Review of patient past medical history, allergies, medications, health status, including review of consultants reports,  laboratory and other test data, was performed as part of comprehensive evaluation and provision of chronic care management services.   SDOH:  (Social Determinants of Health) assessments and interventions performed: No  SDOH Screenings   Alcohol Screen: Not on file  Depression (PHQ2-9): Medium Risk   PHQ-2 Score: 5  Financial Resource Strain: Not on file  Food Insecurity: No Food Insecurity   Worried About Charity fundraiser in the Last Year: Never true   Ran Out of Food in the Last Year: Never true  Housing: Low Risk    Last Housing Risk Score: 0  Physical Activity: Not on file  Social Connections: Not on file  Stress: Not on file  Tobacco Use: Low Risk  Smoking Tobacco Use: Never   Smokeless Tobacco Use: Never   Passive Exposure: Not on file  Transportation Needs: No Transportation Needs   Lack of Transportation (Medical): No   Lack of Transportation (Non-Medical): No    CCM Care Plan  Allergies  Allergen Reactions   Food Anaphylaxis    Peanuts; Almonds   Other Shortness Of Breath    Peanuts; Almonds   Wheat Bran Shortness Of Breath   Statins Itching and Other (See Comments)    Generalized aches   Iodine Other (See Comments)    The patient denied this allergy to me on 03/16/2021   Shellfish Allergy Other (See Comments)    Mouth gets raw   Sitagliptin     Other reaction(s): Unknown   Tetracycline Other (See Comments)    Raw mouth   Tetracyclines & Related Itching   Contrast Media [Iodinated Diagnostic Agents] Rash    Happened during CT scan over 30 years ago    Medications Reviewed Today     Reviewed by Carolyn Russo, RPH (Pharmacist) on 08/18/21 at 1525  Med List Status: <None>   Medication Order Taking? Sig Documenting Provider Last Dose Status Informant  albuterol (VENTOLIN HFA) 108 (90 Base) MCG/ACT inhaler [id number] No Inhale 2 puffs into the lungs every 4 (four) hours as needed for wheezing or shortness of breath. Carolyn Chard, MD Taking Active  Spouse/Significant Other  BESIVANCE 0.6 % SUSP [id number] No Place 1 drop into both eyes See admin instructions. After  eye injections [provider] Taking Active Spouse/Significant Other  carvedilol (COREG) 3.125 MG tablet [id number]  Take 1 tablet (3.125 mg total) by mouth 2 (two) times daily with a meal. Ghumman, Ramandeep, NP  Active   clopidogrel (PLAVIX) 75 MG tablet [id number] No Take 1 tablet (75 mg total) by mouth daily. Bary Leriche, PA-C Taking Active   Continuous Blood Gluc Receiver (FREESTYLE LIBRE 2 READER) DEVI [id number] No by Does not apply route. [provider] Taking Active   diclofenac Sodium (VOLTAREN) 1 % GEL [id number] No Apply 2 g topically 4 (four) times daily. To left wrist Love, Ivan Anchors, PA-C Taking Active   diphenhydrAMINE (BENADRYL) 25 MG tablet [id number] No Take 25 mg by mouth every 6 (six) hours as needed for itching. [provider] Taking Active Self  fluticasone (FLONASE) 50 MCG/ACT nasal spray [id number] No Place 2 sprays into the nose daily as needed for allergies.  [provider] Taking Active Spouse/Significant Other  hydrALAZINE (APRESOLINE) 10 MG tablet [id number] No Take 1 tablet (10 mg total) by mouth every 8 (eight) hours as needed (Sys BP >180, hold on HD days). Bary Leriche, PA-C Taking Active   hydrocortisone (ANUSOL-HC) 2.5 % rectal cream [id number] No Place 1 application rectally 2 (two) times daily as needed for hemorrhoids or anal itching. Jonetta Osgood, MD Taking Active   hydrocortisone cream 1 % [id number] No Apply to affected area 2 times daily Carolyn Brine, FNP Taking Active   hydrOXYzine (ATARAX/VISTARIL) 25 MG tablet [id number] No Take 1 tablet (25 mg total) by mouth at bedtime as needed. Carolyn Brine, FNP Taking Active   hydrOXYzine (VISTARIL) 25 MG capsule [id number] No Take 25 mg by mouth 3 (three) times daily as needed. Dose unknown [provider] Taking Active   insulin glargine (LANTUS) 100  UNIT/ML Solostar Pen [id number]  Inject 13 Units into the skin daily. Bary Castilla, NP  Active   Insulin Pen Needle (BD PEN NEEDLE NANO U/F) 32G X  4 MM MISC [id number] No Use daily as directed. Bary Leriche, [place] Taking Active   Patient not taking:  Discontinued 04/16/21 1518 levETIRAcetam (KEPPRA) 500 MG tablet [id number]  Take 1 tablet (500 mg total) by mouth at bedtime. Lovorn, Jinny Blossom, MD  Active   melatonin 3 MG TABS tablet [id number] No Take 1 tablet (3 mg total) by mouth at bedtime. Bary Leriche, PA-C Taking Active   methylphenidate (RITALIN) 5 MG tablet [id number]  Take 1 tablet (5 mg total) by mouth 2 (two) times daily with breakfast and lunch. Lovorn, Jinny Blossom, MD  Active   mometasone-formoterol [facility]) 200-5 MCG/ACT Hollie Salk [id number]  Inhale 2 puffs into the lungs 2 (two) times daily. Bary Castilla, NP  Active   Nutritional Supplements (,FEEDING SUPPLEMENT, PROSOURCE PLUS) liquid [id number] No Take 30 mLs by mouth 2 (two) times daily between meals. Bary Leriche, PA-C Taking Active   ondansetron (ZOFRAN) 4 MG tablet [id number] No Take 1 tablet (4 mg total) by mouth every 6 (six) hours as needed for nausea or vomiting. Carolyn Brine, FNP Taking Active   OneTouch Delica Lancets [address] [place] [id number] No Use as directed to check blood sugars before breakfast and dinner dx: e11.65 Carolyn Chard, MD Taking Active   pantoprazole (PROTONIX) 40 MG tablet [id number]  Take 1 tablet (40 mg total) by mouth daily. Bary Castilla, NP  Active   phenylephrine (,USE FOR PREPARATION-H,) 0.25 % suppository [id number] No Place 1 suppository rectally 2 (two) times daily. Carolyn Brine, FNP Taking Active   polyethylene glycol powder (GLYCOLAX/MIRALAX) 17 GM/SCOOP powder [id number] No Take 17 g by mouth daily. Bary Leriche, PA-C Taking Active            Med Note Dominica Severin Jun 21, 2021  3:20 PM) As needed  rosuvastatin (CRESTOR) 20 MG tablet [id number]  Take 1 tablet (20 mg total) by mouth daily.  Bary Castilla, NP  Active   senna-docusate (SENOKOT-S) 8.6-50 MG tablet [id number]  Take 2 tablets by mouth 2 (two) times daily. Bary Castilla, NP  Active             Patient Active Problem List   Diagnosis Date Noted   Pain of right hand 08/06/2021   Steal syndrome of dialysis vascular access ([place]) 08/06/2021   Subclavian steal syndrome of right subclavian artery 06/25/2021   Right hand weakness 06/25/2021   Stroke-like symptoms 05/06/2021   Bandemia    Cerebrovascular accident (CVA) of right basal ganglia ([place]) 04/20/2021   External hemorrhoids    Basal ganglia infarction ([place]) 04/16/2021   ESRD on dialysis ([place]) 04/16/2021   Hypertensive urgency 03/10/2021   Hilar enlargement    Anemia of chronic disease    Chronic diastolic CHF (congestive heart failure) ([place]) 03/09/2021   CKD (chronic kidney disease), stage V ([place]) 03/08/2021   Diabetic retinopathy ([place]) 03/08/2021   Hyperglycemia due to type 2 diabetes mellitus ([place]) 03/08/2021   Pure hypercholesterolemia 03/08/2021   Anemia in chronic kidney disease 09/24/2020   Uncontrolled type 2 diabetes mellitus with hyperglycemia ([place]) 03/19/2019   Osteopenia 09/04/2018   Migraine 09/04/2018   Asthma 09/04/2018   Pseudophakia of both eyes 07/17/2018   Pseudophakia of left eye 07/03/2018   Open angle with borderline findings and high glaucoma risk in left eye 07/03/2018   Age-related nuclear cataract of right eye 07/03/2018   CAD (coronary artery disease), native coronary artery 04/27/2017   Abnormal nuclear stress test 04/04/2017   Dyspnea on exertion 03/09/2017  Appendicitis    Age-related hypermature cataract of both eyes 12/20/2016   Uterine leiomyoma 11/27/2012   Dyslipidemia (high LDL; low HDL) 11/25/2011   Obesity (BMI 30-39.9) 11/25/2011   Hypertensive disorder 11/21/2011   Type 2 diabetes mellitus ([place]) 11/21/2011   Abnormal EKG 11/21/2011    Immunization History  Administered Date(s) Administered    Fluad Quad(high Dose 65+) 08/12/2020   Influenza, High Dose Seasonal PF 08/16/2018, 07/16/2019   PFIZER(Purple Top)SARS-COV-2 Vaccination 12/14/2019, 01/07/2020   Tdap 07/23/2008    Conditions to be addressed/monitored:  Diabetes and Coronary Artery Disease  Care Plan : [place]  Updates made by Carolyn Russo, RPH since 08/19/2021 12:00 AM     Problem: HLD, DM II, Query Nausea/Vomiting   Priority: High     Goal: Disease Management   Note:   Current Barriers:  Unable to independently afford treatment regimen Unable to self administer medications as prescribed  Pharmacist Clinical Goal(s):  Patient will achieve adherence to monitoring guidelines and medication adherence to achieve therapeutic efficacy through collaboration with PharmD and provider.   Interventions: 1:1 collaboration with Carolyn Chard, MD regarding development and update of comprehensive plan of care as evidenced by provider attestation and co-signature Inter-disciplinary care team collaboration (see longitudinal plan of care) Comprehensive medication review performed; medication list updated in electronic medical record  Hyperlipidemia: (LDL goal < 70) -Controlled -Current treatment: Rosuvastatin 20 mg tablet once per day  Carvedilol 3.125 mg tablet twice pr day with meal Hydralazine 10 mg taking 1 tablet by mouth every 8 hours as needed for sys BP>180 hold on HD days -Medications previously tried: none noted at this time   -Current dietary patterns: patient is not eating a diet high in fried and fatty foods  -Current exercise habits: will discuss during next office visit  -Educated on Cholesterol goals;  Benefits of statin for ASCVD risk reduction; Importance of limiting foods high in cholesterol; -Collaborated with PCP team to change patients Rosuvastatin to 10 mg tablet once per day to renal function.     Diabetes (A1c goal <8%) -Controlled -Current medications: Lantus 100  unit/ml: inject 13 units into the skin once daily -Current home glucose readings: will discuss during next office visit  -Denies hypoglycemic/hyperglycemic symptoms -Current meal patterns: will discuss during next office visit  -Current exercise: will discuss during next office visit  -Educated on A1c and blood sugar goals; -Patients husband reports that his wife takes another insulin, there are two other insulins on the profile of the patient : Humalog 70/30, and  -Counseled to check feet daily and get yearly eye exams -Recommended to continue current medication  Query Nausea/Vomiting (Goal: reduce nausea and vomiting) -Uncontrolled -Current treatment  Ondansetron 4 mg tablet by mouth every 6 hours  Patient is taking 6 hours almost 2 hour apart -Dr.Roemer reports that his wife has trouble with nausea and takes 2 tables every day for nausa. -Recommended to continue current medication Collaborated with PCP to determine if patient is eligible to receive more Zofran due to nausea during dialysis.   Patient Goals/Self-Care Activities Patient will:  - take medications as prescribed  Follow Up Plan: The patient has been provided with contact information for the care management team and has been advised to call with any health related questions or concerns.       Medication Assistance: None required.  Patient affirms current coverage meets needs.  Compliance/Adherence/Medication fill history: Care Gaps: Pneumonia Vaccine Shingrix Vaccine DEXA Scan TDAP Covid-19 Vaccine Opthalmology Exam  Colonoscopy Influenza Vaccine   Star-Rating Drugs: Rosuvastatin 20 mg tablet    Patient's preferred pharmacy is:  [facility] [place], [place] AT [name] [name] [address] Phone: [phone] Fax: [phone]  [facility] (New Address) - [place], [place] AT  Previously: [address], [address] [place] [address] Phone: [phone] Fax: [phone]  Uses pill box? Yes Pt endorses 80% compliance  We discussed: Benefits of medication synchronization, packaging and delivery as well as enhanced pharmacist oversight with [employer]. Patient decided to: Continue current medication management strategy  Care Plan and Follow Up Patient Decision:  Patient agrees to Care Plan and Follow-up.  Plan: The patient has been provided with contact information for the care management team and has been advised to call with any health related questions or concerns.   Orlando Penner, PharmD Clinical Pharmacist [facility] [phone]

## 2021-08-19 DIAGNOSIS — E1122 Type 2 diabetes mellitus with diabetic chronic kidney disease: Secondary | ICD-10-CM | POA: Diagnosis not present

## 2021-08-19 DIAGNOSIS — N186 End stage renal disease: Secondary | ICD-10-CM | POA: Diagnosis not present

## 2021-08-19 DIAGNOSIS — I69354 Hemiplegia and hemiparesis following cerebral infarction affecting left non-dominant side: Secondary | ICD-10-CM | POA: Diagnosis not present

## 2021-08-19 DIAGNOSIS — D631 Anemia in chronic kidney disease: Secondary | ICD-10-CM | POA: Diagnosis not present

## 2021-08-19 DIAGNOSIS — I132 Hypertensive heart and chronic kidney disease with heart failure and with stage 5 chronic kidney disease, or end stage renal disease: Secondary | ICD-10-CM | POA: Diagnosis not present

## 2021-08-19 DIAGNOSIS — I5033 Acute on chronic diastolic (congestive) heart failure: Secondary | ICD-10-CM | POA: Diagnosis not present

## 2021-08-19 NOTE — Patient Instructions (Signed)
Visit Information It was great speaking with you today!  Please let me know if you have any questions about our visit.   Goals Addressed             This Visit's Progress    Manage My Medicine       Timeframe:  Long-Range Goal Priority:  High Start Date:                             Expected End Date:                       Follow Up Date 08/26/2021    - call for medicine refill 2 or 3 days before it runs out - call if I am sick and can't take my medicine - keep a list of all the medicines I take; vitamins and herbals too - learn to read medicine labels - use a pillbox to sort medicine - use an alarm clock or phone to remind me to take my medicine    Why is this important?   These steps will help you keep on track with your medicines.   Notes:  Please call if you have any questions         Patient Care Plan: CCM Pharmacy Care Plan     Problem Identified: HLD, DM II, Query Nausea/Vomiting   Priority: High     Goal: Disease Management   Note:   Current Barriers:  Unable to independently afford treatment regimen Unable to self administer medications as prescribed  Pharmacist Clinical Goal(s):  Patient will achieve adherence to monitoring guidelines and medication adherence to achieve therapeutic efficacy through collaboration with PharmD and provider.   Interventions: 1:1 collaboration with [name], MD regarding development and update of comprehensive plan of care as evidenced by provider attestation and co-signature Inter-disciplinary care team collaboration (see longitudinal plan of care) Comprehensive medication review performed; medication list updated in electronic medical record  Hyperlipidemia: (LDL goal < 70) -Controlled -Current treatment: Rosuvastatin 20 mg tablet once per day  Carvedilol 3.125 mg tablet twice pr day with meal Hydralazine 10 mg taking 1 tablet by mouth every 8 hours as needed for sys BP>180 hold on HD days -Medications  previously tried: none noted at this time   -Current dietary patterns: patient is not eating a diet high in fried and fatty foods  -Current exercise habits: will discuss during next office visit  -Educated on Cholesterol goals;  Benefits of statin for ASCVD risk reduction; Importance of limiting foods high in cholesterol; -Collaborated with PCP team to change patients Rosuvastatin to 10 mg tablet once per day to renal function.     Diabetes (A1c goal <8%) -Controlled -Current medications: Lantus 100 unit/ml: inject 13 units into the skin once daily -Current home glucose readings: will discuss during next office visit  -Denies hypoglycemic/hyperglycemic symptoms -Current meal patterns: will discuss during next office visit  -Current exercise: will discuss during next office visit  -Educated on A1c and blood sugar goals; -Patients husband reports that his wife takes another insulin, there are two other insulins on the profile of the patient : Humalog 70/30, and  -Counseled to check feet daily and get yearly eye exams -Recommended to continue current medication  Query Nausea/Vomiting (Goal: reduce nausea and vomiting) -Uncontrolled -Current treatment  Ondansetron 4 mg tablet by mouth every 6 hours  Patient is taking 6 hours almost 2 hour apart -Dr.Kondracki  reports that his wife has trouble with nausea and takes 2 tables every day for nausa. -Recommended to continue current medication Collaborated with PCP to determine if patient is eligible to receive more Zofran due to nausea during dialysis.   Patient Goals/Self-Care Activities Patient will:  - take medications as prescribed  Follow Up Plan: The patient has been provided with contact information for the care management team and has been advised to call with any health related questions or concerns.        Ms. Doring was given information about Chronic Care Management services today including:  CCM service includes  personalized support from designated clinical staff supervised by her physician, including individualized plan of care and coordination with other care providers 24/7 contact phone numbers for assistance for urgent and routine care needs. Standard insurance, coinsurance, copays and deductibles apply for chronic care management only during months in which we provide at least 20 minutes of these services. Most insurances cover these services at 100%, however patients may be responsible for any copay, coinsurance and/or deductible if applicable. This service may help you avoid the need for more expensive face-to-face services. Only one practitioner may furnish and bill the service in a calendar month. The patient may stop CCM services at any time (effective at the end of the month) by phone call to the office staff.  Patient agreed to services and verbal consent obtained.   The patient verbalized understanding of instructions, educational materials, and care plan provided today and agreed to receive a mailed copy of patient instructions, educational materials, and care plan.   Orlando Penner, PharmD Clinical Pharmacist [facility] [phone]

## 2021-08-20 DIAGNOSIS — Z23 Encounter for immunization: Secondary | ICD-10-CM | POA: Diagnosis not present

## 2021-08-20 DIAGNOSIS — N2581 Secondary hyperparathyroidism of renal origin: Secondary | ICD-10-CM | POA: Diagnosis not present

## 2021-08-20 DIAGNOSIS — N186 End stage renal disease: Secondary | ICD-10-CM | POA: Diagnosis not present

## 2021-08-20 DIAGNOSIS — Z992 Dependence on renal dialysis: Secondary | ICD-10-CM | POA: Diagnosis not present

## 2021-08-20 DIAGNOSIS — D631 Anemia in chronic kidney disease: Secondary | ICD-10-CM | POA: Diagnosis not present

## 2021-08-20 DIAGNOSIS — D509 Iron deficiency anemia, unspecified: Secondary | ICD-10-CM | POA: Diagnosis not present

## 2021-08-23 DIAGNOSIS — D509 Iron deficiency anemia, unspecified: Secondary | ICD-10-CM | POA: Diagnosis not present

## 2021-08-23 DIAGNOSIS — E7800 Pure hypercholesterolemia, unspecified: Secondary | ICD-10-CM | POA: Diagnosis not present

## 2021-08-23 DIAGNOSIS — N2581 Secondary hyperparathyroidism of renal origin: Secondary | ICD-10-CM | POA: Diagnosis not present

## 2021-08-23 DIAGNOSIS — D631 Anemia in chronic kidney disease: Secondary | ICD-10-CM | POA: Diagnosis not present

## 2021-08-23 DIAGNOSIS — Z992 Dependence on renal dialysis: Secondary | ICD-10-CM | POA: Diagnosis not present

## 2021-08-23 DIAGNOSIS — E1165 Type 2 diabetes mellitus with hyperglycemia: Secondary | ICD-10-CM | POA: Diagnosis not present

## 2021-08-23 DIAGNOSIS — Z23 Encounter for immunization: Secondary | ICD-10-CM | POA: Diagnosis not present

## 2021-08-23 DIAGNOSIS — N186 End stage renal disease: Secondary | ICD-10-CM | POA: Diagnosis not present

## 2021-08-24 ENCOUNTER — Other Ambulatory Visit: Payer: Self-pay

## 2021-08-24 ENCOUNTER — Telehealth: Payer: Self-pay | Admitting: Internal Medicine

## 2021-08-24 ENCOUNTER — Telehealth: Payer: Self-pay | Admitting: Interventional Cardiology

## 2021-08-24 DIAGNOSIS — Z992 Dependence on renal dialysis: Secondary | ICD-10-CM | POA: Diagnosis not present

## 2021-08-24 DIAGNOSIS — N186 End stage renal disease: Secondary | ICD-10-CM | POA: Diagnosis not present

## 2021-08-24 DIAGNOSIS — E1122 Type 2 diabetes mellitus with diabetic chronic kidney disease: Secondary | ICD-10-CM | POA: Diagnosis not present

## 2021-08-24 MED ORDER — CLOPIDOGREL BISULFATE 75 MG PO TABS: 75.0000 mg | ORAL_TABLET | Freq: Every day | ORAL | 1 refills | Status: DC

## 2021-08-24 NOTE — Telephone Encounter (Signed)
  Pt c/o swelling: STAT is pt has developed SOB within 24 hours  If swelling, where is the swelling located? Ankles   How much weight have you gained and in what time span? 10 lbs in 4 weeks  Have you gained 3 pounds in a day or 5 pounds in a week?   Do you have a log of your daily weights (if so, list)?   Are you currently taking a fluid pill? No  Are you currently SOB? Yes   Have you traveled recently? No  Pt said she gained 10 lbs in a span of 4 weeks, she also said she was in the hospital recently, she was advised to see Dr. Tamala Julian, she only wants to see Dr. Tamala Julian

## 2021-08-24 NOTE — Telephone Encounter (Signed)
Left message for patient to call back and schedule Medicare Annual Wellness Visit (AWV) either virtually or in office.  Left both my jabber number [phone] and office number    Last AWV 08/12/20  please schedule at anytime with [facility]    This should be a 45 minute visit.  

## 2021-08-24 NOTE — Telephone Encounter (Signed)
Pt states that she has gained about 10lbs in the last 3 weeks.  Goes to dialysis MWF.  Swelling improves slightly with dialysis but she swells right back to where she was before her next session.  Didn't have BP readings written down but states it's usually low.  Denies increased salt intake.  States Nephro is aware of swelling.  Does have SOB associated with swelling.  Improves with dialysis.  Not currently on any diuretics due to her kidneys.  Scheduled pt to see Dr. Johnsie Cancel, DOD tomorrow for eval.  Pt in agreement with plan.

## 2021-08-25 ENCOUNTER — Ambulatory Visit (INDEPENDENT_AMBULATORY_CARE_PROVIDER_SITE_OTHER): Payer: Medicare Other | Admitting: Cardiovascular Disease

## 2021-08-25 ENCOUNTER — Other Ambulatory Visit: Payer: Self-pay

## 2021-08-25 ENCOUNTER — Ambulatory Visit: Payer: Medicare Other | Admitting: Physician Assistant

## 2021-08-25 ENCOUNTER — Encounter: Payer: Self-pay | Admitting: Cardiovascular Disease

## 2021-08-25 ENCOUNTER — Telehealth: Payer: Self-pay

## 2021-08-25 VITALS — BP 128/66 | HR 78 | Ht 63.0 in | Wt 189.0 lb

## 2021-08-25 DIAGNOSIS — R609 Edema, unspecified: Secondary | ICD-10-CM

## 2021-08-25 DIAGNOSIS — D631 Anemia in chronic kidney disease: Secondary | ICD-10-CM | POA: Diagnosis not present

## 2021-08-25 DIAGNOSIS — Z992 Dependence on renal dialysis: Secondary | ICD-10-CM | POA: Diagnosis not present

## 2021-08-25 DIAGNOSIS — N2581 Secondary hyperparathyroidism of renal origin: Secondary | ICD-10-CM | POA: Diagnosis not present

## 2021-08-25 DIAGNOSIS — I6381 Other cerebral infarction due to occlusion or stenosis of small artery: Secondary | ICD-10-CM

## 2021-08-25 DIAGNOSIS — N186 End stage renal disease: Secondary | ICD-10-CM | POA: Diagnosis not present

## 2021-08-25 NOTE — Chronic Care Management (AMB) (Signed)
    Called Carolyn Russo, No answer, left message of appointment on 08-26-2021 at 4:00 via telephone visit with Orlando Penner, Pharm D. Notified to have all medications, supplements, blood pressure and/or blood sugar logs available during appointment and to return call if need to reschedule.    Care Gaps: PNA vac overdue Shingrix overdue Dexa scan overdue Tdap overdue last completed 07-23-2008 3rd covid vaccine overdue last completed 01-07-2020 Yearly Ophthalmology overdue last completed 11-18-2019 Colonoscopy overdue last completed 04-10-2018 Flu vaccine overdue last completed 08-12-2020 AWV 09-02-2021  Star Rating Drug: Rosuvastatin 20 mg- Last filled 06-29-2021 90 DS [facility]  Any gaps in medications fill history? No  [place] Pharmacist Assistant [phone]

## 2021-08-25 NOTE — Progress Notes (Signed)
CARDIOLOGY CONSULT NOTE       Patient ID: Carolyn Russo MRN: [id number] DOB/AGE: 1951/07/21 70 y.o.  Admit date: (Not on file) Referring Physician: Tamala Julian Primary Physician: [name], MD Primary Cardiologist: Tamala Julian Reason for Consultation: Edema, weight gain  Active Problems:   * No active hospital problems. *   HPI:  70 y.o. patient of Dr Tamala Julian added on to DOD schedule for increasing weight and edema She is on dialysis with very little urine output. History of HTN, HLD, DM-2 She has non obstructive CAD by cath June 2018 EF 60% Last echo done 03/11/21 EF 50% with mild LVH and grade 2 diastolic dysfunction No significant valve dx   She had stage one basilic surgery for fistula by Dr Doren Custard on 11/30/04 complicated by ischemic monometic neuropathy and offerred to ligate right fistula but patient declined  Since being on dialysis she has eaten more and gained weight She gets edema in between dialysis She makes a lot of urine still Cr around 6 Hb better with iron Rx now Hct 31   ROS All other systems reviewed and negative except as noted above  Past Medical History:  Diagnosis Date   Acute GI bleeding    Allergy    Anemia    Anterior chest wall pain    Appendicitis 1965   Asthma    Body mass index 37.0-37.9, adult    Breast pain    Cataract    both eyes   CHF (congestive heart failure) ([place])    Chronic kidney disease    stage 5 - on dialysis   Cognitive change 04/20/2021   r/t cva 03/2821   Dehydration 2014   Deviated septum 1971   Diabetes mellitus    no meds   Dyspnea 2014   Extrinsic asthma    WITH ASTHMA ATTACK   Fibroid 1980   GERD (gastroesophageal reflux disease)    Heart murmur    Hx gestational diabetes    Hyperlipidemia    Hypertension 2014   Inguinal hernia 1959   Malaise and fatigue 2014   Non-IgE mediated allergic asthma 2014   Obesity    Pelvic pain    Pregnancy, high-risk 1985   Stroke ([place]) 04/20/2021   (CVA) of right basal ganglia    Tonsillitis 1968   Uterine fibroid 1980    Family History  Problem Relation Age of Onset   Cancer Mother        abdominal melamona   Psoriasis Mother    Alzheimer's disease Father    Cancer Cousin        colon    Diabetes Cousin    Diabetes Maternal Aunt    Colon cancer Neg Hx    Colon polyps Neg Hx    Esophageal cancer Neg Hx    Rectal cancer Neg Hx    Stomach cancer Neg Hx    Breast cancer Neg Hx     Social History   Socioeconomic History   Marital status: Married    Spouse name: [name]   Number of children: 1   Years of education: Not on file   Highest education level: Professional school degree (e.g., MD, DDS, DVM, JD)  Occupational History   Occupation: retired  Tobacco Use   Smoking status: Never   Smokeless tobacco: Never  Vaping Use   Vaping Use: Never used  Substance and Sexual Activity   Alcohol use: No   Drug use: No   Sexual activity: Yes    Birth  control/protection: Post-menopausal  Other Topics Concern   Not on file  Social History Narrative   06/21/21 lives with husband   Patient reports she used to walk at [employer] but now due to Covid-19 she is staying inside.   Social Determinants of Health   Financial Resource Strain: Not on file  Food Insecurity: No Food Insecurity   Worried About Charity fundraiser in the Last Year: Never true   Ran Out of Food in the Last Year: Never true  Transportation Needs: No Transportation Needs   Lack of Transportation (Medical): No   Lack of Transportation (Non-Medical): No  Physical Activity: Not on file  Stress: Not on file  Social Connections: Not on file  Intimate Partner Violence: Not on file    Past Surgical History:  Procedure Laterality Date   APPENDECTOMY  2585   BASCILIC VEIN TRANSPOSITION Right 04/30/2021   Procedure: RIGHT FIRST STAGE Christine;  Surgeon: Angelia Mould, MD;  Location: [place];  Service: Vascular;  Laterality: Right;   [place]   COLONOSCOPY      ESOPHAGOGASTRODUODENOSCOPY (EGD) WITH PROPOFOL N/A 04/18/2021   Procedure: ESOPHAGOGASTRODUODENOSCOPY (EGD) WITH PROPOFOL;  Surgeon: Gatha Mayer, MD;  Location: [place];  Service: Endoscopy;  Laterality: N/A;   EYE SURGERY     bilateral cataract    FLEXIBLE SIGMOIDOSCOPY N/A 04/18/2021   Procedure: FLEXIBLE SIGMOIDOSCOPY;  Surgeon: Gatha Mayer, MD;  Location: [facility] END[facility]COPY;  Service: Endoscopy;  Laterality: N/A;   HERNIA REPAIR  1959   IR FLUORO GUIDE CV LINE RIGHT  03/18/2021   IR US GUIDE VASC ACCESS RIGHT  03/18/2021   LEFT HEART CATH AND CORONARY ANGIOGRAPHY N/A 04/04/2017   Procedure: Left Heart Cath and Coronary Angiography;  Surgeon: Belva Crome, MD;  Location: [place] CV LAB;  Service: Cardiovascular;  Laterality: N/A;   MYOMECTOMY  1980, 2004, 2007   RHINOPLASTY  1971   ROTATOR CUFF REPAIR  2003   SURGICAL REPAIR OF HEMORRHAGE  2015   TONSILLECTOMY  1968      Current Outpatient Medications:    acetaminophen (OFIRMEV) 10 MG/ML SOLN, Take by mouth., Disp: , Rfl:    albuterol (VENTOLIN HFA) 108 (90 Base) MCG/ACT inhaler, Inhale 2 puffs into the lungs every 4 (four) hours as needed for wheezing or shortness of breath., Disp: 18 g, Rfl: 3   BESIVANCE 0.6 % SUSP, Place 1 drop into both eyes See admin instructions. After  eye injections, Disp: , Rfl:    carvedilol (COREG) 3.125 MG tablet, Take 1 tablet (3.125 mg total) by mouth 2 (two) times daily with a meal., Disp: 60 tablet, Rfl: 0   clopidogrel (PLAVIX) 75 MG tablet, Take 1 tablet (75 mg total) by mouth daily., Disp: 90 tablet, Rfl: 1   Continuous Blood Gluc Receiver (FREESTYLE LIBRE 2 READER) DEVI, by Does not apply route., Disp: , Rfl:    diclofenac Sodium (VOLTAREN) 1 % GEL, Apply 2 g topically 4 (four) times daily. To left wrist, Disp: 150 g, Rfl: 0   diphenhydrAMINE (BENADRYL) 25 MG tablet, Take 25 mg by mouth every 6 (six) hours as needed for itching., Disp: , Rfl:    fluticasone (FLONASE) 50 MCG/ACT nasal spray,  Place 2 sprays into the nose daily as needed for allergies. , Disp: , Rfl:    hydrALAZINE (APRESOLINE) 10 MG tablet, Take 1 tablet (10 mg total) by mouth every 8 (eight) hours as needed (Sys BP >180, hold on HD  days)., Disp: 90 tablet, Rfl: 0   hydrocortisone (ANUSOL-HC) 2.5 % rectal cream, Place 1 application rectally 2 (two) times daily as needed for hemorrhoids or anal itching., Disp: 30 g, Rfl: 1   hydrocortisone cream 1 %, Apply to affected area 2 times daily, Disp: 30 g, Rfl: 1   hydrOXYzine (ATARAX/VISTARIL) 25 MG tablet, Take 1 tablet (25 mg total) by mouth at bedtime as needed., Disp: 30 tablet, Rfl: 2   insulin aspart protamine - aspart (NOVOLOG MIX 70/30 FLEXPEN) (70-30) 100 UNIT/ML FlexPen, 35uam / 5u/25upm, Disp: , Rfl:    insulin glargine (LANTUS) 100 UNIT/ML Solostar Pen, Inject 13 Units into the skin daily., Disp: 15 mL, Rfl: 2   Insulin Pen Needle (BD PEN NEEDLE NANO U/F) 32G X 4 MM MISC, Use daily as directed., Disp: 100 each, Rfl: 0   iron sucrose in sodium chloride 0.9 % 100 mL, Inject into the vein., Disp: , Rfl:    levETIRAcetam (KEPPRA) 500 MG tablet, Take 1 tablet (500 mg total) by mouth at bedtime., Disp: 90 tablet, Rfl: 1   melatonin 3 MG TABS tablet, Take 1 tablet (3 mg total) by mouth at bedtime., Disp: 30 tablet, Rfl: 0   methylphenidate (RITALIN) 5 MG tablet, Take 1 tablet (5 mg total) by mouth 2 (two) times daily with breakfast and lunch., Disp: 60 tablet, Rfl: 0   mometasone-formoterol (DULERA) 200-5 MCG/ACT AERO, Inhale 2 puffs into the lungs 2 (two) times daily., Disp: 3 each, Rfl: 2   Nutritional Supplements (,FEEDING SUPPLEMENT, PROSOURCE PLUS) liquid, Take 30 mLs by mouth 2 (two) times daily between meals., Disp: 887 mL, Rfl: 0   ondansetron (ZOFRAN) 4 MG tablet, Take 1 tablet (4 mg total) by mouth every 6 (six) hours as needed for nausea or vomiting., Disp: 30 tablet, Rfl: 0   OneTouch Delica Lancets 28B MISC, Use as directed to check blood sugars before breakfast  and dinner dx: e11.65, Disp: 100 each, Rfl: 11   pantoprazole (PROTONIX) 40 MG tablet, Take 1 tablet (40 mg total) by mouth daily., Disp: 90 tablet, Rfl: 2   phenylephrine (,USE FOR PREPARATION-H,) 0.25 % suppository, Place 1 suppository rectally 2 (two) times daily., Disp: 12 suppository, Rfl: 0   polyethylene glycol powder (GLYCOLAX/MIRALAX) 17 GM/SCOOP powder, Take 17 g by mouth daily., Disp: 510 g, Rfl: 0   rosuvastatin (CRESTOR) 20 MG tablet, Take 1 tablet (20 mg total) by mouth daily., Disp: 90 tablet, Rfl: 2   senna-docusate (SENOKOT-S) 8.6-50 MG tablet, Take 2 tablets by mouth 2 (two) times daily., Disp: 360 tablet, Rfl: 2   hydrOXYzine (VISTARIL) 25 MG capsule, Take 25 mg by mouth 3 (three) times daily as needed. Dose unknown (Patient not taking: Reported on 08/25/2021), Disp: , Rfl:     Physical Exam: There were no vitals taken for this visit.    Affect appropriate Chronically ill female  HEENT: normal Neck supple with no adenopathy JVP normal left bruits no thyromegaly Lungs clear with no wheezing and good diaphragmatic motion Heart:  S1/S2 SEM murmur, no rub, gallop or click PMI normal Abdomen: benighn, BS positve, no tenderness, no AAA no bruit.  No HSM or HJR Distal pulses intact with no bruits Trace bilateral edema Fistula right arm with good thrill and radial pulse but weak hand  Dialysis catheter right subclavian    Labs:   Lab Results  Component Value Date   WBC 10.3 05/26/2021   HGB 10.8 (L) 05/26/2021   HCT 31.9 (L) 05/26/2021  MCV 87.6 05/26/2021   PLT 348 05/26/2021   No results for input(s): NA, K, CL, CO2, BUN, CREATININE, CALCIUM, PROT, BILITOT, ALKPHOS, ALT, AST, GLUCOSE in the last 168 hours.  Invalid input(s): LABALBU No results found for: CKTOTAL, CKMB, CKMBINDEX, TROPONINI  Lab Results  Component Value Date   CHOL 135 04/16/2021   CHOL 158 02/17/2021   CHOL 143 12/30/2020   Lab Results  Component Value Date   HDL 47 04/16/2021   HDL 40  02/17/2021   HDL 40 12/30/2020   Lab Results  Component Value Date   LDLCALC 70 04/16/2021   LDLCALC 99 02/17/2021   LDLCALC 80 12/30/2020   Lab Results  Component Value Date   TRIG 89 04/16/2021   TRIG 103 02/17/2021   TRIG 131 12/30/2020   Lab Results  Component Value Date   CHOLHDL 2.9 04/16/2021   CHOLHDL 4.0 02/17/2021   CHOLHDL 3.6 12/30/2020   No results found for: LDLDIRECT    Radiology: No results found.  EKG: SR PAC LAD.08/25/2021 no acute changes    ASSESSMENT AND PLAN:   Edema:  No evidence of CHF she has been eating more including salt she has trace pedal edema She makes urine and told her to ask [place] [facility] if she should not be on lasix. Her EF has been low normal by echo in May and no need to repeat She will f/u with Dr Doren Custard regarding access but still has lots of right hand weakness Suspect she also has some nephrosis and dietary indiscretion contributing to her edema in between dialysis sessions   Will arrange f/u with Dr Tamala Julian next available   Signed: Jenkins Rouge 08/25/2021, 12:09 PM

## 2021-08-25 NOTE — Patient Instructions (Addendum)
Medication Instructions:  *If you need a refill on your cardiac medications before your next appointment, please call your pharmacy*  Lab Work: If you have labs (blood work) drawn today and your tests are completely normal, you will receive your results only by: [place] (if you have MyChart) OR A paper copy in the mail If you have any lab test that is abnormal or we need to change your treatment, we will call you to review the results.  Testing/Procedures: None ordered today.  Follow-Up: At [facility], you and your health needs are our priority.  As part of our continuing mission to provide you with exceptional heart care, we have created designated Provider Care Teams.  These Care Teams include your primary Cardiologist (physician) and Advanced Practice Providers (APPs -  Physician Assistants and Nurse Practitioners) who all work together to provide you with the care you need, when you need it.  We recommend signing up for the patient portal called "MyChart".  Sign up information is provided on this After Visit Summary.  MyChart is used to connect with patients for Virtual Visits (Telemedicine).  Patients are able to view lab/test results, encounter notes, upcoming appointments, etc.  Non-urgent messages can be sent to your provider as well.   To learn more about what you can do with MyChart, go to [email or web address].    Your next appointment:   NEXT AVAILABLE   The format for your next appointment:   In Person  Provider:   You may see Sinclair Grooms, MD or one of the following Advanced Practice Providers on your designated Care Team:   Cecilie Kicks, NP

## 2021-08-26 ENCOUNTER — Ambulatory Visit: Payer: Medicare Other | Admitting: Physical Medicine & Rehabilitation

## 2021-08-26 ENCOUNTER — Telehealth: Payer: Medicare Other

## 2021-08-26 DIAGNOSIS — I5033 Acute on chronic diastolic (congestive) heart failure: Secondary | ICD-10-CM | POA: Diagnosis not present

## 2021-08-26 DIAGNOSIS — N186 End stage renal disease: Secondary | ICD-10-CM | POA: Diagnosis not present

## 2021-08-26 DIAGNOSIS — E1122 Type 2 diabetes mellitus with diabetic chronic kidney disease: Secondary | ICD-10-CM | POA: Diagnosis not present

## 2021-08-26 DIAGNOSIS — I132 Hypertensive heart and chronic kidney disease with heart failure and with stage 5 chronic kidney disease, or end stage renal disease: Secondary | ICD-10-CM | POA: Diagnosis not present

## 2021-08-26 DIAGNOSIS — I69354 Hemiplegia and hemiparesis following cerebral infarction affecting left non-dominant side: Secondary | ICD-10-CM | POA: Diagnosis not present

## 2021-08-26 DIAGNOSIS — D631 Anemia in chronic kidney disease: Secondary | ICD-10-CM | POA: Diagnosis not present

## 2021-08-26 NOTE — Progress Notes (Incomplete)
Current Barriers:  {pharmacybarriers:24917}  Pharmacist Clinical Goal(s):  Patient will {PHARMACYGOALCHOICES:24921} through collaboration with PharmD and provider.   Interventions: 1:1 collaboration with [name], MD regarding development and update of comprehensive plan of care as evidenced by provider attestation and co-signature Inter-disciplinary care team collaboration (see longitudinal plan of care) Comprehensive medication review performed; medication list updated in electronic medical record  {CCM [facility] DISEASE STATES:25130}  Patient Goals/Self-Care Activities Patient will:  - {pharmacypatientgoals:24919}  Follow Up Plan: {CM FOLLOW UP UUVO:53664}   Chronic Care Management Pharmacy Note  08/26/2021 Name:  Carolyn Russo MRN:  [id number] DOB:  [birth year]  Summary: ***  Recommendations/Changes made from today's visit: ***  Plan: ***   Subjective: Carolyn Russo is an 70 y.o. year old female who is a primary patient of [name], MD.  The CCM team was consulted for assistance with disease management and care coordination needs.    {CCMTELEPHONEFACETOFACE:21091510} for {CCMINITIALFOLLOWUPCHOICE:21091511} in response to provider referral for pharmacy case management and/or care coordination services.   Consent to Services:  {CCMCONSENTOPTIONS:25074}  Patient Care Team: [name], MD as PCP - General (Internal Medicine) Belva Crome, MD as PCP - Cardiology (Cardiology) Mayford Knife, [facility] (Pharmacist) Center, [facility]  Recent office visits: ***  Recent consult visits: [facility] visits: {Hospital DC Yes/No:25215}   Objective:  Lab Results  Component Value Date   CREATININE 5.29 (H) 05/26/2021   BUN 22 05/26/2021   GFRNONAA 8 (L) 05/26/2021   GFRAA 17 06/19/2020   NA 129 (L) 05/26/2021   K 4.8 05/26/2021   CALCIUM 9.3 05/26/2021   CO2 24 05/26/2021   GLUCOSE 186 (H) 05/26/2021    Lab Results  Component Value  Date/Time   HGBA1C 5.2 04/16/2021 06:40 PM   HGBA1C 6.2 (H) 02/17/2021 11:46 AM   HGBA1C 8.6 12/22/2020 12:00 AM   HGBA1C 9.6 06/19/2020 12:00 AM   MICROALBUR 150 03/27/2019 01:37 PM   MICROALBUR 1.41 10/13/2011 09:03 AM    Last diabetic Eye exam:  Lab Results  Component Value Date/Time   HMDIABEYEEXA Retinopathy (A) 11/18/2019 12:00 AM    Last diabetic Foot exam: No results found for: HMDIABFOOTEX   Lab Results  Component Value Date   CHOL 135 04/16/2021   HDL 47 04/16/2021   LDLCALC 70 04/16/2021   TRIG 89 04/16/2021   CHOLHDL 2.9 04/16/2021    Hepatic Function Latest Ref Rng & Units 05/26/2021 05/24/2021 05/21/2021  Total Protein 6.5 - 8.1 g/dL - - -  Albumin 3.5 - 5.0 g/dL 3.4(L) 3.4(L) 3.2(L)  AST 15 - 41 U/L - - -  ALT 0 - 44 U/L - - -  Alk Phosphatase 38 - 126 U/L - - -  Total Bilirubin 0.3 - 1.2 mg/dL - - -  Bilirubin, Direct 0.00 - 0.40 mg/dL - - -    Lab Results  Component Value Date/Time   TSH 1.353 10/13/2011 09:03 AM    CBC Latest Ref Rng & Units 05/26/2021 05/24/2021 05/21/2021  WBC 4.0 - 10.5 K/uL 10.3 11.8(H) 8.5  Hemoglobin 12.0 - 15.0 g/dL 10.8(L) 10.6(L) 9.8(L)  Hematocrit 36.0 - 46.0 % 31.9(L) 30.2(L) 29.5(L)  Platelets 150 - 400 K/uL 348 325 297    Lab Results  Component Value Date/Time   VD25OH 37 10/13/2011 09:03 AM    Clinical ASCVD: {YES/NO:21197} The ASCVD Risk score (Arnett DK, et al., 2019) failed to calculate for the following reasons:   The patient has a prior MI or stroke diagnosis  Depression screen [facility] 2/9 06/25/2021 11/26/2020 08/12/2020  Decreased Interest 1 0 0  Down, Depressed, Hopeless 1 0 0  PHQ - 2 Score 2 0 0  Altered sleeping 0 - -  Tired, decreased energy 3 - -  Change in appetite 0 - -  Feeling bad or failure about yourself  0 - -  Trouble concentrating 0 - -  Moving slowly or fidgety/restless 0 - -  Suicidal thoughts 0 - -  PHQ-9 Score 5 - -  Difficult doing work/chores Somewhat difficult - -  Some recent data might  be hidden     ***Other: (CHADS2VASc if Afib, MMRC or CAT for COPD, ACT, DEXA)  Social History   Tobacco Use  Smoking Status Never  Smokeless Tobacco Never   BP Readings from Last 3 Encounters:  08/25/21 128/66  08/06/21 (!) 159/78  06/25/21 120/71   Pulse Readings from Last 3 Encounters:  08/25/21 78  08/06/21 77  06/25/21 75   Wt Readings from Last 3 Encounters:  08/25/21 189 lb (85.7 kg)  05/27/21 171 lb 4.8 oz (77.7 kg)  05/07/21 176 lb 5.9 oz (80 kg)   BMI Readings from Last 3 Encounters:  08/25/21 33.48 kg/m  08/06/21 31.33 kg/m  05/27/21 31.33 kg/m    Assessment/Interventions: Review of patient past medical history, allergies, medications, health status, including review of consultants reports, laboratory and other test data, was performed as part of comprehensive evaluation and provision of chronic care management services.   SDOH:  (Social Determinants of Health) assessments and interventions performed: {yes/no:20286}  SDOH Screenings   Alcohol Screen: Not on file  Depression (PHQ2-9): Medium Risk   PHQ-2 Score: 5  Financial Resource Strain: Not on file  Food Insecurity: No Food Insecurity   Worried About Charity fundraiser in the Last Year: Never true   Ran Out of Food in the Last Year: Never true  Housing: Low Risk    Last Housing Risk Score: 0  Physical Activity: Not on file  Social Connections: Not on file  Stress: Not on file  Tobacco Use: Low Risk    Smoking Tobacco Use: Never   Smokeless Tobacco Use: Never   Passive Exposure: Not on file  Transportation Needs: No Transportation Needs   Lack of Transportation (Medical): No   Lack of Transportation (Non-Medical): No    CCM Care Plan  Allergies  Allergen Reactions   Food Anaphylaxis    Peanuts; Almonds   Other Shortness Of Breath    Peanuts; Almonds   Wheat Bran Shortness Of Breath   Statins Itching and Other (See Comments)    Generalized aches   Iodine Other (See Comments)    The  patient denied this allergy to me on 03/16/2021   Shellfish Allergy Other (See Comments)    Mouth gets raw   Sitagliptin     Other reaction(s): Unknown   Tetracycline Other (See Comments)    Raw mouth   Tetracyclines & Related Itching   Contrast Media [Iodinated Diagnostic Agents] Rash    Happened during CT scan over 30 years ago    Medications Reviewed Today     Reviewed by Alver Fisher, CMA (Certified Medical Assistant) on 08/25/21 at [place] List Status: <None>   Medication Order Taking? Sig Documenting Provider Last Dose Status Informant  acetaminophen (OFIRMEV) 10 MG/ML SOLN [id number] Yes Take by mouth. [provider]  Active   albuterol (VENTOLIN HFA) 108 (90 Base) MCG/ACT inhaler [id number] Yes Inhale 2 puffs  into the lungs every 4 (four) hours as needed for wheezing or shortness of breath. Bary Castilla, NP Taking Active   BESIVANCE 0.6 % SUSP [id number] Yes Place 1 drop into both eyes See admin instructions. After  eye injections [provider] Taking Active Spouse/Significant Other  carvedilol (COREG) 3.125 MG tablet [id number] Yes Take 1 tablet (3.125 mg total) by mouth 2 (two) times daily with a meal. Ghumman, Ramandeep, NP Taking Active   clopidogrel (PLAVIX) 75 MG tablet [id number] Yes Take 1 tablet (75 mg total) by mouth daily. [name], MD Taking Active   Continuous Blood Gluc Receiver (FREESTYLE LIBRE 2 READER) DEVI [id number] Yes by Does not apply route. [provider] Taking Active   diclofenac Sodium (VOLTAREN) 1 % GEL [id number] Yes Apply 2 g topically 4 (four) times daily. To left wrist Love, Ivan Anchors, PA-C Taking Active   diphenhydrAMINE (BENADRYL) 25 MG tablet [id number] Yes Take 25 mg by mouth every 6 (six) hours as needed for itching. [provider] Taking Active Self  fluticasone (FLONASE) 50 MCG/ACT nasal spray [id number] Yes Place 2 sprays into the nose daily as needed for allergies.  [provider]  Taking Active Spouse/Significant Other  hydrALAZINE (APRESOLINE) 10 MG tablet [id number] Yes Take 1 tablet (10 mg total) by mouth every 8 (eight) hours as needed (Sys BP >180, hold on HD days). Bary Leriche, PA-C Taking Active   hydrocortisone (ANUSOL-HC) 2.5 % rectal cream [id number] Yes Place 1 application rectally 2 (two) times daily as needed for hemorrhoids or anal itching. Jonetta Osgood, MD Taking Active   hydrocortisone cream 1 % [id number] Yes Apply to affected area 2 times daily Minette Brine, FNP Taking Active   hydrOXYzine (ATARAX/VISTARIL) 25 MG tablet [id number] Yes Take 1 tablet (25 mg total) by mouth at bedtime as needed. Minette Brine, FNP Taking Active   hydrOXYzine (VISTARIL) 25 MG capsule [id number] No Take 25 mg by mouth 3 (three) times daily as needed. Dose unknown  Patient not taking: Reported on 08/25/2021   [provider] Not Taking Active   insulin aspart protamine - aspart (NOVOLOG MIX 70/30 FLEXPEN) (70-30) 100 UNIT/ML FlexPen [id number] Yes 35uam / 5u/25upm [provider] Taking Active   insulin glargine (LANTUS) 100 UNIT/ML Solostar Pen [id number] Yes Inject 13 Units into the skin daily. Bary Castilla, NP Taking Active   Insulin Pen Needle (BD PEN NEEDLE NANO U/F) 32G X 4 MM MISC [id number] Yes Use daily as directed. Bary Leriche, [place] Taking Active    Patient not taking:   Discontinued 04/16/21 1518 iron sucrose in sodium chloride 0.9 % 100 mL [id number] Yes Inject into the vein. [provider] Taking Active   levETIRAcetam (KEPPRA) 500 MG tablet [id number] Yes Take 1 tablet (500 mg total) by mouth at bedtime. Lovorn, Jinny Blossom, MD Taking Active   melatonin 3 MG TABS tablet [id number] Yes Take 1 tablet (3 mg total) by mouth at bedtime. Bary Leriche, PA-C Taking Active   methylphenidate (RITALIN) 5 MG tablet [id number] Yes Take 1 tablet (5 mg total) by mouth 2 (two) times daily with breakfast and lunch. Lovorn, Jinny Blossom, MD Taking Active    mometasone-formoterol [facility]) 200-5 MCG/ACT Hollie Salk [id number] Yes Inhale 2 puffs into the lungs 2 (two) times daily. Bary Castilla, NP Taking Active   Nutritional Supplements (,FEEDING SUPPLEMENT, PROSOURCE PLUS) liquid [id number] Yes Take 30 mLs by mouth 2 (two) times daily between meals. Bary Leriche, PA-C Taking Active   ondansetron (ZOFRAN) 4 MG tablet  [id number] Yes Take 1 tablet (4 mg total) by mouth every 6 (six) hours as needed for nausea or vomiting. Minette Brine, FNP Taking Active   [facility] Lancets 42A [place] [id number] Yes Use as directed to check blood sugars before breakfast and dinner dx: e11.65 Bary Castilla, NP Taking Active   pantoprazole (PROTONIX) 40 MG tablet [id number] Yes Take 1 tablet (40 mg total) by mouth daily. Bary Castilla, NP Taking Active   phenylephrine (,USE FOR PREPARATION-H,) 0.25 % suppository [id number] Yes Place 1 suppository rectally 2 (two) times daily. Minette Brine, FNP Taking Active   polyethylene glycol powder (GLYCOLAX/MIRALAX) 17 GM/SCOOP powder [id number] Yes Take 17 g by mouth daily. Bary Castilla, NP Taking Active   rosuvastatin (CRESTOR) 20 MG tablet [id number] Yes Take 1 tablet (20 mg total) by mouth daily. Bary Castilla, NP Taking Active   senna-docusate (SENOKOT-S) 8.6-50 MG tablet [id number] Yes Take 2 tablets by mouth 2 (two) times daily. Bary Castilla, NP Taking Active             Patient Active Problem List   Diagnosis Date Noted   Pain of right hand 08/06/2021   Steal syndrome of dialysis vascular access ([place]) 08/06/2021   Subclavian steal syndrome of right subclavian artery 06/25/2021   Right hand weakness 06/25/2021   Stroke-like symptoms 05/06/2021   Bandemia    Cerebrovascular accident (CVA) of right basal ganglia ([place]) 04/20/2021   External hemorrhoids    Basal ganglia infarction ([place]) 04/16/2021   ESRD on dialysis ([place]) 04/16/2021   Hypertensive urgency 03/10/2021   Hilar enlargement    Anemia  of chronic disease    Chronic diastolic CHF (congestive heart failure) ([place]) 03/09/2021   CKD (chronic kidney disease), stage V ([place]) 03/08/2021   Diabetic retinopathy ([place]) 03/08/2021   Hyperglycemia due to type 2 diabetes mellitus ([place]) 03/08/2021   Pure hypercholesterolemia 03/08/2021   Anemia in chronic kidney disease 09/24/2020   Uncontrolled type 2 diabetes mellitus with hyperglycemia ([place]) 03/19/2019   Osteopenia 09/04/2018   Migraine 09/04/2018   Asthma 09/04/2018   Pseudophakia of both eyes 07/17/2018   Pseudophakia of left eye 07/03/2018   Open angle with borderline findings and high glaucoma risk in left eye 07/03/2018   Age-related nuclear cataract of right eye 07/03/2018   CAD (coronary artery disease), native coronary artery 04/27/2017   Abnormal nuclear stress test 04/04/2017   Dyspnea on exertion 03/09/2017   Appendicitis    Age-related hypermature cataract of both eyes 12/20/2016   Uterine leiomyoma 11/27/2012   Dyslipidemia (high LDL; low HDL) 11/25/2011   Obesity (BMI 30-39.9) 11/25/2011   Hypertensive disorder 11/21/2011   Type 2 diabetes mellitus ([place]) 11/21/2011   Abnormal EKG 11/21/2011    Immunization History  Administered Date(s) Administered   Fluad Quad(high Dose 65+) 08/12/2020   Influenza, High Dose Seasonal PF 08/16/2018, 07/16/2019   PFIZER(Purple Top)SARS-COV-2 Vaccination 12/14/2019, 01/07/2020   Tdap 07/23/2008    Conditions to be addressed/monitored:  {USCCMDZASSESSMENTOPTIONS:23563}  There are no care plans that you recently modified to display for this patient.    Medication Assistance: {MEDASSISTANCEINFO:25044}  Compliance/Adherence/Medication fill history: Care Gaps: ***  Star-Rating Drugs: ***  Patient's preferred pharmacy is:  [facility] [place] [name] [address] Phone: [phone] Fax: [phone]  [facility] (New Address) - [place], [address] AT Previously: [place], [address]  [place] [address] Phone: [phone] Fax: [phone]  Uses pill box? {Yes or If no, why not?:20788} Pt endorses ***% compliance  We discussed: {Pharmacy options:24294} Patient decided to: {US Pharmacy Plan:23885}  Care Plan and Follow Up Patient Decision:  {FOLLOWUP:24991}  Plan: {CM FOLLOW UP PLAN:25073}  ***

## 2021-08-27 DIAGNOSIS — N186 End stage renal disease: Secondary | ICD-10-CM | POA: Diagnosis not present

## 2021-08-27 DIAGNOSIS — E785 Hyperlipidemia, unspecified: Secondary | ICD-10-CM | POA: Diagnosis not present

## 2021-08-27 DIAGNOSIS — I081 Rheumatic disorders of both mitral and tricuspid valves: Secondary | ICD-10-CM | POA: Diagnosis not present

## 2021-08-27 DIAGNOSIS — I5033 Acute on chronic diastolic (congestive) heart failure: Secondary | ICD-10-CM | POA: Diagnosis not present

## 2021-08-27 DIAGNOSIS — E1122 Type 2 diabetes mellitus with diabetic chronic kidney disease: Secondary | ICD-10-CM | POA: Diagnosis not present

## 2021-08-27 DIAGNOSIS — Z9181 History of falling: Secondary | ICD-10-CM | POA: Diagnosis not present

## 2021-08-27 DIAGNOSIS — Z79899 Other long term (current) drug therapy: Secondary | ICD-10-CM | POA: Diagnosis not present

## 2021-08-27 DIAGNOSIS — Z794 Long term (current) use of insulin: Secondary | ICD-10-CM | POA: Diagnosis not present

## 2021-08-27 DIAGNOSIS — I2510 Atherosclerotic heart disease of native coronary artery without angina pectoris: Secondary | ICD-10-CM | POA: Diagnosis not present

## 2021-08-27 DIAGNOSIS — Z992 Dependence on renal dialysis: Secondary | ICD-10-CM | POA: Diagnosis not present

## 2021-08-27 DIAGNOSIS — J45909 Unspecified asthma, uncomplicated: Secondary | ICD-10-CM | POA: Diagnosis not present

## 2021-08-27 DIAGNOSIS — D631 Anemia in chronic kidney disease: Secondary | ICD-10-CM | POA: Diagnosis not present

## 2021-08-27 DIAGNOSIS — Z8701 Personal history of pneumonia (recurrent): Secondary | ICD-10-CM | POA: Diagnosis not present

## 2021-08-27 DIAGNOSIS — M25531 Pain in right wrist: Secondary | ICD-10-CM | POA: Diagnosis not present

## 2021-08-27 DIAGNOSIS — Z7951 Long term (current) use of inhaled steroids: Secondary | ICD-10-CM | POA: Diagnosis not present

## 2021-08-27 DIAGNOSIS — I132 Hypertensive heart and chronic kidney disease with heart failure and with stage 5 chronic kidney disease, or end stage renal disease: Secondary | ICD-10-CM | POA: Diagnosis not present

## 2021-08-27 DIAGNOSIS — N2581 Secondary hyperparathyroidism of renal origin: Secondary | ICD-10-CM | POA: Diagnosis not present

## 2021-08-27 DIAGNOSIS — I69354 Hemiplegia and hemiparesis following cerebral infarction affecting left non-dominant side: Secondary | ICD-10-CM | POA: Diagnosis not present

## 2021-08-27 DIAGNOSIS — Z7902 Long term (current) use of antithrombotics/antiplatelets: Secondary | ICD-10-CM | POA: Diagnosis not present

## 2021-08-30 DIAGNOSIS — D631 Anemia in chronic kidney disease: Secondary | ICD-10-CM | POA: Diagnosis not present

## 2021-08-30 DIAGNOSIS — N2581 Secondary hyperparathyroidism of renal origin: Secondary | ICD-10-CM | POA: Diagnosis not present

## 2021-08-30 DIAGNOSIS — Z992 Dependence on renal dialysis: Secondary | ICD-10-CM | POA: Diagnosis not present

## 2021-08-30 DIAGNOSIS — N186 End stage renal disease: Secondary | ICD-10-CM | POA: Diagnosis not present

## 2021-08-31 ENCOUNTER — Other Ambulatory Visit: Payer: Self-pay

## 2021-08-31 ENCOUNTER — Ambulatory Visit (INDEPENDENT_AMBULATORY_CARE_PROVIDER_SITE_OTHER): Payer: Medicare Other | Admitting: Podiatry

## 2021-08-31 ENCOUNTER — Encounter: Payer: Self-pay | Admitting: Podiatry

## 2021-08-31 DIAGNOSIS — M79674 Pain in right toe(s): Secondary | ICD-10-CM

## 2021-08-31 DIAGNOSIS — B351 Tinea unguium: Secondary | ICD-10-CM

## 2021-08-31 DIAGNOSIS — E119 Type 2 diabetes mellitus without complications: Secondary | ICD-10-CM | POA: Diagnosis not present

## 2021-08-31 DIAGNOSIS — M79675 Pain in left toe(s): Secondary | ICD-10-CM | POA: Diagnosis not present

## 2021-08-31 DIAGNOSIS — I5033 Acute on chronic diastolic (congestive) heart failure: Secondary | ICD-10-CM | POA: Diagnosis not present

## 2021-08-31 DIAGNOSIS — D631 Anemia in chronic kidney disease: Secondary | ICD-10-CM | POA: Diagnosis not present

## 2021-08-31 DIAGNOSIS — I69354 Hemiplegia and hemiparesis following cerebral infarction affecting left non-dominant side: Secondary | ICD-10-CM | POA: Diagnosis not present

## 2021-08-31 DIAGNOSIS — N186 End stage renal disease: Secondary | ICD-10-CM | POA: Diagnosis not present

## 2021-08-31 DIAGNOSIS — I132 Hypertensive heart and chronic kidney disease with heart failure and with stage 5 chronic kidney disease, or end stage renal disease: Secondary | ICD-10-CM | POA: Diagnosis not present

## 2021-08-31 DIAGNOSIS — E1122 Type 2 diabetes mellitus with diabetic chronic kidney disease: Secondary | ICD-10-CM | POA: Diagnosis not present

## 2021-08-31 NOTE — Progress Notes (Signed)
  Subjective:  Patient ID: Carolyn Russo, female    DOB: [birth year],   MRN: [id number]  Chief Complaint  Patient presents with   Nail Problem    Diabetic foot care     70 y.o. female presents for diabetic nail care. Patient relates painful elongated and thickened toenails. Relates she cannot trim them herself and hoping to have them trimmed today . Denies any other pedal complaints. Denies n/v/f/c.   Past Medical History:  Diagnosis Date   Acute GI bleeding    Allergy    Anemia    Anterior chest wall pain    Appendicitis 1965   Asthma    Body mass index 37.0-37.9, adult    Breast pain    Cataract    both eyes   CHF (congestive heart failure) ([place])    Chronic kidney disease    stage 5 - on dialysis   Cognitive change 04/20/2021   r/t cva 03/2821   Dehydration 2014   Deviated septum 1971   Diabetes mellitus    no meds   Dyspnea 2014   Extrinsic asthma    WITH ASTHMA ATTACK   Fibroid 1980   GERD (gastroesophageal reflux disease)    Heart murmur    Hx gestational diabetes    Hyperlipidemia    Hypertension 2014   Inguinal hernia 1959   Malaise and fatigue 2014   Non-IgE mediated allergic asthma 2014   Obesity    Pelvic pain    Pregnancy, high-risk 1985   Stroke ([name]) 04/20/2021   (CVA) of right basal ganglia   Tonsillitis 1968   Uterine fibroid 1980    Objective:  Physical Exam: Vascular: DP/PT pulses 2/4 bilateral. CFT <3 seconds. Normal hair growth on digits. No edema.  Skin. No lacerations or abrasions bilateral feet. Nails 1-5 are thickened discolored and elongated with subungual debris.  Musculoskeletal: MMT 5/5 bilateral lower extremities in DF, PF, Inversion and Eversion. Deceased ROM in DF of ankle joint.  Neurological: Sensation intact to light touch.   Assessment:   1. Pain due to onychomycosis of toenails of both feet   2. Diabetes mellitus without complication ([place])   3. Onychomycosis      Plan:  Patient was evaluated and treated and all  questions answered. -Discussed and educated patient on diabetic foot care, especially with  regards to the vascular, neurological and musculoskeletal systems.  -Stressed the importance of good glycemic control and the detriment of not  controlling glucose levels in relation to the foot. -Discussed supportive shoes at all times and checking feet regularly.  -Mechanically debrided all nails 1-5 bilateral using sterile nail nipper and filed with dremel without incident  -Answered all patient questions -Patient to return  in 3 months for at risk foot care -Patient advised to call the office if any problems or questions arise in the meantime.   Lorenda Peck, DPM

## 2021-09-01 DIAGNOSIS — N186 End stage renal disease: Secondary | ICD-10-CM | POA: Diagnosis not present

## 2021-09-01 DIAGNOSIS — Z992 Dependence on renal dialysis: Secondary | ICD-10-CM | POA: Diagnosis not present

## 2021-09-01 DIAGNOSIS — N2581 Secondary hyperparathyroidism of renal origin: Secondary | ICD-10-CM | POA: Diagnosis not present

## 2021-09-01 DIAGNOSIS — D631 Anemia in chronic kidney disease: Secondary | ICD-10-CM | POA: Diagnosis not present

## 2021-09-02 ENCOUNTER — Telehealth: Payer: Self-pay

## 2021-09-02 ENCOUNTER — Other Ambulatory Visit: Payer: Self-pay | Admitting: Internal Medicine

## 2021-09-02 ENCOUNTER — Other Ambulatory Visit: Payer: Self-pay

## 2021-09-02 ENCOUNTER — Ambulatory Visit (INDEPENDENT_AMBULATORY_CARE_PROVIDER_SITE_OTHER): Payer: Medicare Other

## 2021-09-02 VITALS — Ht 63.0 in | Wt 190.0 lb

## 2021-09-02 DIAGNOSIS — D631 Anemia in chronic kidney disease: Secondary | ICD-10-CM | POA: Diagnosis not present

## 2021-09-02 DIAGNOSIS — N186 End stage renal disease: Secondary | ICD-10-CM | POA: Diagnosis not present

## 2021-09-02 DIAGNOSIS — Z1231 Encounter for screening mammogram for malignant neoplasm of breast: Secondary | ICD-10-CM

## 2021-09-02 DIAGNOSIS — I132 Hypertensive heart and chronic kidney disease with heart failure and with stage 5 chronic kidney disease, or end stage renal disease: Secondary | ICD-10-CM | POA: Diagnosis not present

## 2021-09-02 DIAGNOSIS — I69354 Hemiplegia and hemiparesis following cerebral infarction affecting left non-dominant side: Secondary | ICD-10-CM | POA: Diagnosis not present

## 2021-09-02 DIAGNOSIS — E1122 Type 2 diabetes mellitus with diabetic chronic kidney disease: Secondary | ICD-10-CM | POA: Diagnosis not present

## 2021-09-02 DIAGNOSIS — E2839 Other primary ovarian failure: Secondary | ICD-10-CM | POA: Diagnosis not present

## 2021-09-02 DIAGNOSIS — Z0000 Encounter for general adult medical examination without abnormal findings: Secondary | ICD-10-CM | POA: Diagnosis not present

## 2021-09-02 DIAGNOSIS — I5033 Acute on chronic diastolic (congestive) heart failure: Secondary | ICD-10-CM | POA: Diagnosis not present

## 2021-09-02 DIAGNOSIS — Z13820 Encounter for screening for osteoporosis: Secondary | ICD-10-CM | POA: Diagnosis not present

## 2021-09-02 MED ORDER — METHYLPHENIDATE HCL 5 MG PO TABS: 5.0000 mg | ORAL_TABLET | Freq: Two times a day (BID) | ORAL | 0 refills | Status: DC

## 2021-09-02 NOTE — Chronic Care Management (AMB) (Signed)
Chronic Care Management Pharmacy Assistant   Name: Carolyn Russo  MRN: [id number] DOB: 1950-12-17  Reason for Encounter: Disease State/ Diabetes  Recent office visits:  None  Recent consult visits:  08-31-2021 Lorenda Peck, MD (Podiatry). Discussed and educated patient on diabetic foot care, especially with  regards to the vascular, neurological and musculoskeletal systems.  -Stressed the importance of good glycemic control and the detriment of not  controlling glucose levels in relation to the foot. -Discussed supportive shoes at all times and checking feet regularly.  -Mechanically debrided all nails 1-5 bilateral using sterile nail nipper and filed with dremel without incident  -Answered all patient questions -Patient to return  in 3 months for at risk foot care -Patient advised to call the office if any problems or questions arise in the meantime.  08-25-2021 Josue Hector, MD (Cardiology). EKG completed. No evidence of CHF she has been eating more including salt she has trace pedal edema She makes urine and told her to ask [place] [facility] if she should not be on lasix. Her EF has been low normal by echo in May and no need to repeat She will f/u with Dr Doren Custard regarding access but still has lots of right hand weakness Suspect she also has some nephrosis and dietary indiscretion contributing to her edema in between dialysis sessions.  Hospital visits:  None in previous 6 months  Medications: Outpatient Encounter Medications as of 09/02/2021  Medication Sig   acetaminophen (OFIRMEV) 10 MG/ML SOLN Take by mouth.   albuterol (VENTOLIN HFA) 108 (90 Base) MCG/ACT inhaler Inhale 2 puffs into the lungs every 4 (four) hours as needed for wheezing or shortness of breath.   BESIVANCE 0.6 % SUSP Place 1 drop into both eyes See admin instructions. After  eye injections   carvedilol (COREG) 3.125 MG tablet Take 1 tablet (3.125 mg total) by mouth 2 (two) times daily with a meal.    clopidogrel (PLAVIX) 75 MG tablet Take 1 tablet (75 mg total) by mouth daily.   Continuous Blood Gluc Receiver (FREESTYLE LIBRE 2 READER) DEVI by Does not apply route.   diclofenac Sodium (VOLTAREN) 1 % GEL Apply 2 g topically 4 (four) times daily. To left wrist   diphenhydrAMINE (BENADRYL) 25 MG tablet Take 25 mg by mouth every 6 (six) hours as needed for itching.   fluticasone (FLONASE) 50 MCG/ACT nasal spray Place 2 sprays into the nose daily as needed for allergies.    hydrALAZINE (APRESOLINE) 10 MG tablet Take 1 tablet (10 mg total) by mouth every 8 (eight) hours as needed (Sys BP >180, hold on HD days).   hydrocortisone (ANUSOL-HC) 2.5 % rectal cream Place 1 application rectally 2 (two) times daily as needed for hemorrhoids or anal itching.   hydrocortisone cream 1 % Apply to affected area 2 times daily   hydrOXYzine (ATARAX/VISTARIL) 25 MG tablet Take 1 tablet (25 mg total) by mouth at bedtime as needed.   hydrOXYzine (VISTARIL) 25 MG capsule Take 25 mg by mouth 3 (three) times daily as needed. Dose unknown (Patient not taking: Reported on 08/25/2021)   insulin aspart protamine - aspart (NOVOLOG MIX 70/30 FLEXPEN) (70-30) 100 UNIT/ML FlexPen 35uam / 5u/25upm   insulin glargine (LANTUS) 100 UNIT/ML Solostar Pen Inject 13 Units into the skin daily.   Insulin Pen Needle (BD PEN NEEDLE NANO 2ND GEN) 32G X 4 MM MISC See admin instructions.   iron sucrose in sodium chloride 0.9 % 100 mL Inject into the vein.  levETIRAcetam (KEPPRA) 500 MG tablet Take 1 tablet (500 mg total) by mouth at bedtime.   melatonin 3 MG TABS tablet Take 1 tablet (3 mg total) by mouth at bedtime.   Methoxy PEG-Epoetin Beta (MIRCERA IJ) Mircera   methylphenidate (RITALIN) 5 MG tablet Take 1 tablet (5 mg total) by mouth 2 (two) times daily with breakfast and lunch.   mometasone-formoterol (DULERA) 200-5 MCG/ACT AERO Inhale 2 puffs into the lungs 2 (two) times daily.   Nutritional Supplements (,FEEDING SUPPLEMENT, PROSOURCE  PLUS) liquid Take 30 mLs by mouth 2 (two) times daily between meals.   ondansetron (ZOFRAN) 4 MG tablet Take 1 tablet (4 mg total) by mouth every 6 (six) hours as needed for nausea or vomiting.   OneTouch [place] Lancets 69G MISC Use as directed to check blood sugars before breakfast and dinner dx: e11.65   pantoprazole (PROTONIX) 40 MG tablet Take 1 tablet (40 mg total) by mouth daily.   phenylephrine (,USE FOR PREPARATION-H,) 0.25 % suppository Place 1 suppository rectally 2 (two) times daily.   polyethylene glycol powder (GLYCOLAX/MIRALAX) 17 GM/SCOOP powder Take 17 g by mouth daily.   rosuvastatin (CRESTOR) 20 MG tablet Take 1 tablet (20 mg total) by mouth daily.   senna-docusate (SENOKOT-S) 8.6-50 MG tablet Take 2 tablets by mouth 2 (two) times daily.   [DISCONTINUED] iron polysaccharides (NIFEREX) 150 MG capsule Take 1 capsule (150 mg total) by mouth daily. (Patient not taking: No sig reported)   No facility-administered encounter medications on file as of 09/02/2021.   Recent Relevant Labs: Lab Results  Component Value Date/Time   HGBA1C 5.2 04/16/2021 06:40 PM   HGBA1C 6.2 (H) 02/17/2021 11:46 AM   HGBA1C 8.6 12/22/2020 12:00 AM   HGBA1C 9.6 06/19/2020 12:00 AM   MICROALBUR 150 03/27/2019 01:37 PM   MICROALBUR 1.41 10/13/2011 09:03 AM    Kidney Function Lab Results  Component Value Date/Time   CREATININE 5.29 (H) 05/26/2021 12:41 PM   CREATININE 6.14 (H) 05/24/2021 12:53 PM   CREATININE 0.84 10/13/2011 09:03 AM   GFRNONAA 8 (L) 05/26/2021 12:41 PM   GFRNONAA 75 10/13/2011 09:03 AM   GFRAA 17 06/19/2020 12:00 AM   GFRAA 86 10/13/2011 09:03 AM    Current antihyperglycemic regimen:  Lantus 100 unit/ml: inject 12 units before bed Novolog sliding scale  What recent interventions/DTPs have been made to improve glycemic control:  Educated on A1c and blood sugar goals Counseled to check feet daily and get yearly eye exams Recommended to continue current medication  Have  there been any recent hospitalizations or ED visits since last visit with CPP? No  Patient denies hypoglycemic symptoms  Patient denies hyperglycemic symptoms  How often are you checking your blood sugar? 3-4 times daily  What are your blood sugars ranging?  Fasting: This morning 118 Before meals: None After meals: None Bedtime: None  During the week, how often does your blood glucose drop below 70? Never  Are you checking your feet daily/regularly? Patient's nurse states daily.  Adherence Review: Is the patient currently on a STATIN medication? Yes Is the patient currently on ACE/ARB medication? No Does the patient have >5 day gap between last estimated fill dates? No  NOTES: Patient's nurse states patient is in need of refills on ritalin. Contacted Dr. Florentina Jenny office and left a voicemail requesting refills.  Care Gaps: PNA vac overdue Shingrix overdue Dexa scan overdue Tdap overdue last completed 07-23-2008 3rd covid vaccine overdue last completed 01-07-2020 Yearly Ophthalmology overdue last completed 11-18-2019 Colonoscopy  overdue last completed 04-10-2018 Flu vaccine overdue last completed 08-12-2020 AWV 09-02-2021  Star Rating Drugs: Rosuvastatin 20 mg- Last filled 06-29-2021 90 DS [place] Clinical Pharmacist Assistant [phone]

## 2021-09-02 NOTE — Telephone Encounter (Signed)
Filled  Written  ID  Drug  QTY  Days  Prescriber  RX #  Dispenser  Refill  Daily Dose*  Pymt Type  PMP  07/04/2021 06/25/2021 1  Methylphenidate 5 Mg Tablet 60.00 30 Me Lov [id number] [facility] (5672) 0/0  Comm Ins [place]

## 2021-09-02 NOTE — Progress Notes (Signed)
I connected with Rosselyn Olkowski today by telephone and verified that I am speaking with the correct person using two identifiers. Location patient: home Location provider: work Persons participating in the virtual visit: Masen Sari, [place] LPN.   I discussed the limitations, risks, security and privacy concerns of performing an evaluation and management service by telephone and the availability of in person appointments. I also discussed with the patient that there may be a patient responsible charge related to this service. The patient expressed understanding and verbally consented to this telephonic visit.    Interactive audio and video telecommunications were attempted between this provider and patient, however failed, due to patient having technical difficulties OR patient did not have access to video capability.  We continued and completed visit with audio only.     Vital signs may be patient reported or missing.  Subjective:   Suella CAMBRIE SONNENFELD is a 70 y.o. female who presents for Medicare Annual (Subsequent) preventive examination.  Review of Systems     Cardiac Risk Factors include: advanced age (>41men, >65 women);diabetes mellitus;hypertension;obesity (BMI >30kg/m2);sedentary lifestyle     Objective:    Today's Vitals   09/02/21 1216 09/02/21 1217  Weight: 190 lb (86.2 kg)   Height: 5\' 3"  (1.6 m)   PainSc:  4    Body mass index is 33.66 kg/m.  Advanced Directives 09/02/2021 06/09/2021 05/11/2021 04/20/2021 11/26/2020 08/12/2020 03/27/2019  Does Patient Have a Medical Advance Directive? Yes Yes Yes Yes Yes Yes No  Type of Paramedic of [place];Living will [place];Living will Living will Living will - [place];Living will -  Does patient want to make changes to medical advance directive? - - No - Patient declined No - Patient declined - - -  Copy of [place] in Chart? No - copy requested -  - - - No - copy requested -  Would patient like information on creating a medical advance directive? - - - - - - No - Patient declined    Current Medications (verified) Outpatient Encounter Medications as of 09/02/2021  Medication Sig   acetaminophen (OFIRMEV) 10 MG/ML SOLN Take by mouth.   albuterol (VENTOLIN HFA) 108 (90 Base) MCG/ACT inhaler Inhale 2 puffs into the lungs every 4 (four) hours as needed for wheezing or shortness of breath.   BESIVANCE 0.6 % SUSP Place 1 drop into both eyes See admin instructions. After  eye injections   carvedilol (COREG) 3.125 MG tablet Take 1 tablet (3.125 mg total) by mouth 2 (two) times daily with a meal.   clopidogrel (PLAVIX) 75 MG tablet Take 1 tablet (75 mg total) by mouth daily.   Continuous Blood Gluc Receiver (FREESTYLE LIBRE 2 READER) DEVI by Does not apply route.   diclofenac Sodium (VOLTAREN) 1 % GEL Apply 2 g topically 4 (four) times daily. To left wrist   diphenhydrAMINE (BENADRYL) 25 MG tablet Take 25 mg by mouth every 6 (six) hours as needed for itching.   fluticasone (FLONASE) 50 MCG/ACT nasal spray Place 2 sprays into the nose daily as needed for allergies.    hydrALAZINE (APRESOLINE) 10 MG tablet Take 1 tablet (10 mg total) by mouth every 8 (eight) hours as needed (Sys BP >180, hold on HD days).   hydrocortisone (ANUSOL-HC) 2.5 % rectal cream Place 1 application rectally 2 (two) times daily as needed for hemorrhoids or anal itching.   hydrocortisone cream 1 % Apply to affected area 2 times daily  hydrOXYzine (ATARAX/VISTARIL) 25 MG tablet Take 1 tablet (25 mg total) by mouth at bedtime as needed.   hydrOXYzine (VISTARIL) 25 MG capsule Take 25 mg by mouth 3 (three) times daily as needed. Dose unknown (Patient not taking: Reported on 08/25/2021)   insulin aspart protamine - aspart (NOVOLOG MIX 70/30 FLEXPEN) (70-30) 100 UNIT/ML FlexPen 35uam / 5u/25upm   insulin glargine (LANTUS) 100 UNIT/ML Solostar Pen Inject 13 Units into the skin daily.    Insulin Pen Needle (BD PEN NEEDLE NANO 2ND GEN) 32G X 4 MM MISC See admin instructions.   iron sucrose in sodium chloride 0.9 % 100 mL Inject into the vein.   levETIRAcetam (KEPPRA) 500 MG tablet Take 1 tablet (500 mg total) by mouth at bedtime.   melatonin 3 MG TABS tablet Take 1 tablet (3 mg total) by mouth at bedtime.   Methoxy PEG-Epoetin Beta (MIRCERA IJ) Mircera   methylphenidate (RITALIN) 5 MG tablet Take 1 tablet (5 mg total) by mouth 2 (two) times daily with breakfast and lunch.   mometasone-formoterol (DULERA) 200-5 MCG/ACT AERO Inhale 2 puffs into the lungs 2 (two) times daily.   Nutritional Supplements (,FEEDING SUPPLEMENT, PROSOURCE PLUS) liquid Take 30 mLs by mouth 2 (two) times daily between meals.   ondansetron (ZOFRAN) 4 MG tablet Take 1 tablet (4 mg total) by mouth every 6 (six) hours as needed for nausea or vomiting.   OneTouch Delica Lancets 92E MISC Use as directed to check blood sugars before breakfast and dinner dx: e11.65   pantoprazole (PROTONIX) 40 MG tablet Take 1 tablet (40 mg total) by mouth daily.   phenylephrine (,USE FOR PREPARATION-H,) 0.25 % suppository Place 1 suppository rectally 2 (two) times daily.   polyethylene glycol powder (GLYCOLAX/MIRALAX) 17 GM/SCOOP powder Take 17 g by mouth daily.   rosuvastatin (CRESTOR) 20 MG tablet Take 1 tablet (20 mg total) by mouth daily.   senna-docusate (SENOKOT-S) 8.6-50 MG tablet Take 2 tablets by mouth 2 (two) times daily.   [DISCONTINUED] iron polysaccharides (NIFEREX) 150 MG capsule Take 1 capsule (150 mg total) by mouth daily. (Patient not taking: No sig reported)   No facility-administered encounter medications on file as of 09/02/2021.    Allergies (verified) Food, Other, Wheat bran, Statins, Iodine, Shellfish allergy, Sitagliptin, Tetracycline, Tetracyclines & related, and Contrast media [iodinated diagnostic agents]   History: Past Medical History:  Diagnosis Date   Acute GI bleeding    Allergy    Anemia     Anterior chest wall pain    Appendicitis 1965   Asthma    Body mass index 37.0-37.9, adult    Breast pain    Cataract    both eyes   CHF (congestive heart failure) ([place])    Chronic kidney disease    stage 5 - on dialysis   Cognitive change 04/20/2021   r/t cva 03/2821   Dehydration 2014   Deviated septum 1971   Diabetes mellitus    no meds   Dyspnea 2014   Extrinsic asthma    WITH ASTHMA ATTACK   Fibroid 1980   GERD (gastroesophageal reflux disease)    Heart murmur    Hx gestational diabetes    Hyperlipidemia    Hypertension 2014   Inguinal hernia 1959   Malaise and fatigue 2014   Non-IgE mediated allergic asthma 2014   Obesity    Pelvic pain    Pregnancy, high-risk 1985   Stroke ([place]) 04/20/2021   (CVA) of right basal ganglia   Tonsillitis 1968  Uterine fibroid 1980   Past Surgical History:  Procedure Laterality Date   APPENDECTOMY  5625   [place] TRANSPOSITION Right 04/30/2021   Procedure: RIGHT FIRST STAGE [place];  Surgeon: Angelia Mould, MD;  Location: [place];  Service: Vascular;  Laterality: Right;   CESAREAN SECTION  1985   COLONOSCOPY     ESOPHAGOGASTRODUODENOSCOPY (EGD) WITH PROPOFOL N/A 04/18/2021   Procedure: ESOPHAGOGASTRODUODENOSCOPY (EGD) WITH PROPOFOL;  Surgeon: Gatha Mayer, MD;  Location: [place];  Service: Endoscopy;  Laterality: N/A;   EYE SURGERY     bilateral cataract    FLEXIBLE SIGMOIDOSCOPY N/A 04/18/2021   Procedure: FLEXIBLE SIGMOIDOSCOPY;  Surgeon: Gatha Mayer, MD;  Location[facility] ENDOSCOPY;  Service: Endoscopy;  Laterality: N/A;   HERNIA REPAIR  1959   IR FLUORO GUIDE CV LINE RIGHT  03/18/2021   IR US GUIDE VASC ACCESS RIGHT  03/18/2021   LEFT HEART CATH AND CORONARY ANGIOGRAPHY N/A 04/04/2017   Procedure: Left Heart Cath and Coronary Angiography;  Surgeon: Belva Crome, MD;  Location: [place] CV [facility];  Service: Cardiovascular;  Laterality: N/A;   MYOMECTOMY  1980, 2004, 2007   RHINOPLASTY   1971   ROTATOR CUFF REPAIR  2003   SURGICAL REPAIR OF HEMORRHAGE  2015   TONSILLECTOMY  1968   Family History  Problem Relation Age of Onset   Cancer Mother        abdominal melamona   Psoriasis Mother    Alzheimer's disease Father    Cancer Cousin        colon    Diabetes Cousin    Diabetes Maternal Aunt    Colon cancer Neg Hx    Colon polyps Neg Hx    Esophageal cancer Neg Hx    Rectal cancer Neg Hx    Stomach cancer Neg Hx    Breast cancer Neg Hx    Social History   Socioeconomic History   Marital status: Married    Spouse name: [name]   Number of children: 1   Years of education: Not on file   Highest education level: Professional school degree (e.g., MD, DDS, DVM, JD)  Occupational History   Occupation: retired  Tobacco Use   Smoking status: Never   Smokeless tobacco: Never  Vaping Use   Vaping Use: Never used  Substance and Sexual Activity   Alcohol use: No   Drug use: No   Sexual activity: Not Currently    Birth control/protection: Post-menopausal  Other Topics Concern   Not on file  Social History Narrative   06/21/21 lives with husband   Patient reports she used to walk at [employer] but now due to Covid-19 she is staying inside.   Social Determinants of Health   Financial Resource Strain: Low Risk    Difficulty of Paying Living Expenses: Not hard at all  Food Insecurity: No Food Insecurity   Worried About Charity fundraiser in the Last Year: Never true   [place] in the Last Year: Never true  Transportation Needs: No Transportation Needs   Lack of Transportation (Medical): No   Lack of Transportation (Non-Medical): No  Physical Activity: Inactive   Days of Exercise per Week: 0 days   Minutes of Exercise per Session: 0 min  Stress: Stress Concern Present   Feeling of Stress : To some extent  Social Connections: Not on file    Tobacco Counseling Counseling given: Not Answered   Clinical Intake:  Pre-visit preparation  completed:  Yes  Pain : 0-10 Pain Score: 4  Pain Type: Chronic pain Pain Location: Hand Pain Orientation: Right Pain Descriptors / Indicators: Pressure Pain Onset: More than a month ago Pain Frequency: Intermittent     Nutritional Status: BMI > 30  Obese Nutritional Risks: Nausea/ vomitting/ diarrhea (nausea regularly) Diabetes: Yes  How often do you need to have someone help you when you read instructions, pamphlets, or other written materials from your doctor or pharmacy?: 1 - Never What is the last grade level you completed in school?: PhD  Diabetic? Yes Nutrition Risk Assessment:  Has the patient had any N/V/D within the last 2 months?  Yes  Does the patient have any non-healing wounds?  No  Has the patient had any unintentional weight loss or weight gain?  Yes   Diabetes:  Is the patient diabetic?  Yes  If diabetic, was a CBG obtained today?  No  Did the patient bring in their glucometer from home?  No  How often do you monitor your CBG's? 5-6 daily.   Financial Strains and Diabetes Management:  Are you having any financial strains with the device, your supplies or your medication? No .  Does the patient want to be seen by Chronic Care Management for management of their diabetes?  No  Would the patient like to be referred to a Nutritionist or for Diabetic Management?  No   Diabetic Exams:  Diabetic Eye Exam: Overdue for diabetic eye exam. Pt has been advised about the importance in completing this exam. Patient advised to call and schedule an eye exam. Diabetic Foot Exam: Completed 02/17/2021   Interpreter Needed?: No  Information entered by :: NAllen LPN   Activities of Daily Living In your present state of health, do you have any difficulty performing the following activities: 09/02/2021 05/11/2021  Hearing? Tempie Donning  Vision? N N  Difficulty concentrating or making decisions? N Y  Walking or climbing stairs? Y Y  Dressing or bathing? Y Y  Doing errands, shopping? Y N   Preparing Food and eating ? Y -  Using the Toilet? Y -  In the past six months, have you accidently leaked urine? Y -  Do you have problems with loss of bowel control? Y -  Managing your Medications? Y -  Managing your Finances? Y -  Housekeeping or managing your Housekeeping? Y -  Some recent data might be hidden    Patient Care Team: [name], MD as PCP - General (Internal Medicine) Belva Crome, MD as PCP - Cardiology (Cardiology) Mayford Knife, [facility] (Pharmacist) [place] any recent Medical Services you may have received from other than [facility] providers in the past year (date may be approximate).     Assessment:   This is a routine wellness examination for [place].  Hearing/Vision screen No results found.  Dietary issues and exercise activities discussed: Current Exercise Habits: The patient does not participate in regular exercise at present   Goals Addressed             This Visit's Progress    Patient Stated       09/02/2021, to heal and become independent       Depression Screen PHQ 2/9 Scores 09/02/2021 06/25/2021 11/26/2020 08/12/2020 06/18/2019 03/27/2019 03/19/2019  PHQ - 2 Score 0 2 0 0 0 0 0  PHQ- 9 Score - 5 - - - 0 -    Fall Risk Fall Risk  09/02/2021 06/25/2021 06/09/2021  11/26/2020 08/12/2020  Falls in the past year? 1 1 1  0 0  Comment loses balance - - - -  Number falls in past yr: 1 0 0 - -  Comment - - - - -  Injury with Fall? 0 1 0 - -  Comment - stroke - - -  Risk for fall due to : Impaired balance/gait;Impaired mobility;Medication side effect - - - Impaired balance/gait;Medication side effect  Follow up Falls evaluation completed;Education provided;Falls prevention discussed - - - Falls evaluation completed;Education provided;Falls prevention discussed    FALL RISK PREVENTION PERTAINING TO THE HOME:  Any stairs in or around the home? Yes  If so, are there any without handrails? No  Home free of loose throw rugs  in walkways, pet beds, electrical cords, etc? Yes  Adequate lighting in your home to reduce risk of falls? Yes   ASSISTIVE DEVICES UTILIZED TO PREVENT FALLS:  Life alert? No  Use of a cane, walker or w/c? Yes  Grab bars in the bathroom? Yes  Shower chair or bench in shower? Yes  Elevated toilet seat or a handicapped toilet? Yes   TIMED UP AND GO:  Was the test performed? No .      Cognitive Function:     6CIT Screen 09/02/2021 08/12/2020 03/27/2019  What Year? 0 points 0 points 0 points  What month? 0 points 0 points 0 points  What time? 0 points 0 points 3 points  Count back from 20 0 points 0 points 0 points  Months in reverse 0 points 0 points 0 points  Repeat phrase 0 points 10 points 0 points  Total Score 0 10 3    Immunizations Immunization History  Administered Date(s) Administered   Fluad Quad(high Dose 65+) 08/12/2020   Influenza, High Dose Seasonal PF 08/16/2018, 07/16/2019   PFIZER(Purple Top)SARS-COV-2 Vaccination 12/14/2019, 01/07/2020   Tdap 07/23/2008    TDAP status: Due, Education has been provided regarding the importance of this vaccine. Advised may receive this vaccine at local pharmacy or Health Dept. Aware to provide a copy of the vaccination record if obtained from local pharmacy or Health Dept. Verbalized acceptance and understanding.  Flu Vaccine status: Up to date  Pneumococcal vaccine status: Up to date  Covid-19 vaccine status: Completed vaccines  Qualifies for Shingles Vaccine? Yes   Zostavax completed No   Shingrix Completed?: No.    Education has been provided regarding the importance of this vaccine. Patient has been advised to call insurance company to determine out of pocket expense if they have not yet received this vaccine. Advised may also receive vaccine at local pharmacy or Health Dept. Verbalized acceptance and understanding.  Screening Tests Health Maintenance  Topic Date Due   Pneumonia Vaccine 63+ Years old (1 - PCV) Never  done   Zoster Vaccines- Shingrix (1 of 2) Never done   DEXA SCAN  Never done   TETANUS/TDAP  07/23/2018   COVID-19 Vaccine (3 - Pfizer risk series) 02/04/2020   OPHTHALMOLOGY EXAM  11/17/2020   COLONOSCOPY (Pts 45-15yrs Insurance coverage will need to be confirmed)  04/10/2021   INFLUENZA VACCINE  05/24/2021   HEMOGLOBIN A1C  10/16/2021   FOOT EXAM  02/17/2022   MAMMOGRAM  08/03/2022   Hepatitis C Screening  Completed   HPV VACCINES  Aged Out    Health Maintenance  Health Maintenance Due  Topic Date Due   Pneumonia Vaccine 72+ Years old (1 - PCV) Never done   Zoster Vaccines- Shingrix (1 of 2)  Never done   DEXA SCAN  Never done   TETANUS/TDAP  07/23/2018   COVID-19 Vaccine (3 - Pfizer risk series) 02/04/2020   OPHTHALMOLOGY EXAM  11/17/2020   COLONOSCOPY (Pts 45-52yrs Insurance coverage will need to be confirmed)  04/10/2021   INFLUENZA VACCINE  05/24/2021    Colorectal cancer screening: Type of screening: Sigmoidoscopy. Completed 04/18/2021. Repeat every 3 years  Mammogram status: scheduled for January  Bone Density status: ordered today  Lung Cancer Screening: (Low Dose CT Chest recommended if Age 63-80 years, 30 pack-year currently smoking OR have quit w/in 15years.) does not qualify.   Lung Cancer Screening Referral: no  Additional Screening:  Hepatitis C Screening: does qualify; Completed 01/01/2013  Vision Screening: Recommended annual ophthalmology exams for early detection of glaucoma and other disorders of the eye. Is the patient up to date with their annual eye exam?  No  Who is the provider or what is the name of the office in which the patient attends annual eye exams? [facility] If pt is not established with a provider, would they like to be referred to a provider to establish care? No .   Dental Screening: Recommended annual dental exams for proper oral hygiene  Community Resource Referral / Chronic Care Management: CRR required this  visit?  No   CCM required this visit?  No      Plan:     I have personally reviewed and noted the following in the patient's chart:   Medical and social history Use of alcohol, tobacco or illicit drugs  Current medications and supplements including opioid prescriptions.  Functional ability and status Nutritional status Physical activity Advanced directives List of other physicians Hospitalizations, surgeries, and ER visits in previous 12 months Vitals Screenings to include cognitive, depression, and falls Referrals and appointments  In addition, I have reviewed and discussed with patient certain preventive protocols, quality metrics, and best practice recommendations. A written personalized care plan for preventive services as well as general preventive health recommendations were provided to patient.     Kellie Simmering, LPN   [id number]   Nurse Notes: none

## 2021-09-02 NOTE — Patient Instructions (Signed)
Carolyn Russo , Thank you for taking time to come for your Medicare Wellness Visit. I appreciate your ongoing commitment to your health goals. Please review the following plan we discussed and let me know if I can assist you in the future.   Screening recommendations/referrals: Colonoscopy: sigmoidoscopy 04/18/2021, due 04/18/2024 Mammogram: scheduled for January per patient Bone Density: ordered today Recommended yearly ophthalmology/optometry visit for glaucoma screening and checkup Recommended yearly dental visit for hygiene and checkup  Vaccinations: Influenza vaccine: completed at dialysis per patient Pneumococcal vaccine: completed 07/23/2008 Tdap vaccine: due Shingles vaccine: discussed   Covid-19: 01/07/2020, 2/202/2021  Advanced directives: Please bring a copy of your POA (Power of Attorney) and/or Living Will to your next appointment.   Conditions/risks identified: none  Next appointment: Follow up in one year for your annual wellness visit    Preventive Care 65 Years and Older, Female Preventive care refers to lifestyle choices and visits with your health care provider that can promote health and wellness. What does preventive care include? A yearly physical exam. This is also called an annual well check. Dental exams once or twice a year. Routine eye exams. Ask your health care provider how often you should have your eyes checked. Personal lifestyle choices, including: Daily care of your teeth and gums. Regular physical activity. Eating a healthy diet. Avoiding tobacco and drug use. Limiting alcohol use. Practicing safe sex. Taking low-dose aspirin every day. Taking vitamin and mineral supplements as recommended by your health care provider. What happens during an annual well check? The services and screenings done by your health care provider during your annual well check will depend on your age, overall health, lifestyle risk factors, and family history of  disease. Counseling  Your health care provider may ask you questions about your: Alcohol use. Tobacco use. Drug use. Emotional well-being. Home and relationship well-being. Sexual activity. Eating habits. History of falls. Memory and ability to understand (cognition). Work and work Statistician. Reproductive health. Screening  You may have the following tests or measurements: Height, weight, and BMI. Blood pressure. Lipid and cholesterol levels. These may be checked every 5 years, or more frequently if you are over 65 years old. Skin check. Lung cancer screening. You may have this screening every year starting at age 72 if you have a 30-pack-year history of smoking and currently smoke or have quit within the past 15 years. Fecal occult blood test (FOBT) of the stool. You may have this test every year starting at age 72. Flexible sigmoidoscopy or colonoscopy. You may have a sigmoidoscopy every 5 years or a colonoscopy every 10 years starting at age 72. Hepatitis C blood test. Hepatitis B blood test. Sexually transmitted disease (STD) testing. Diabetes screening. This is done by checking your blood sugar (glucose) after you have not eaten for a while (fasting). You may have this done every 1-3 years. Bone density scan. This is done to screen for osteoporosis. You may have this done starting at age 72. Mammogram. This may be done every 1-2 years. Talk to your health care provider about how often you should have regular mammograms. Talk with your health care provider about your test results, treatment options, and if necessary, the need for more tests. Vaccines  Your health care provider may recommend certain vaccines, such as: Influenza vaccine. This is recommended every year. Tetanus, diphtheria, and acellular pertussis (Tdap, Td) vaccine. You may need a Td booster every 10 years. Zoster vaccine. You may need this after age 72. Pneumococcal 13-valent conjugate (  PCV13) vaccine. One  dose is recommended after age 72. Pneumococcal polysaccharide (PPSV23) vaccine. One dose is recommended after age 72. Talk to your health care provider about which screenings and vaccines you need and how often you need them. This information is not intended to replace advice given to you by your health care provider. Make sure you discuss any questions you have with your health care provider. Document Released: 11/06/2015 Document Revised: 06/29/2016 Document Reviewed: 08/11/2015 Elsevier Interactive Patient Education  2017 [place] Prevention in the Home Falls can cause injuries. They can happen to people of all ages. There are many things you can do to make your home safe and to help prevent falls. What can I do on the outside of my home? Regularly fix the edges of walkways and driveways and fix any cracks. Remove anything that might make you trip as you walk through a door, such as a raised step or threshold. Trim any bushes or trees on the path to your home. Use bright outdoor lighting. Clear any walking paths of anything that might make someone trip, such as rocks or tools. Regularly check to see if handrails are loose or broken. Make sure that both sides of any steps have handrails. Any raised decks and porches should have guardrails on the edges. Have any leaves, snow, or ice cleared regularly. Use sand or salt on walking paths during winter. Clean up any spills in your garage right away. This includes oil or grease spills. What can I do in the bathroom? Use night lights. Install grab bars by the toilet and in the tub and shower. Do not use towel bars as grab bars. Use non-skid mats or decals in the tub or shower. If you need to sit down in the shower, use a plastic, non-slip stool. Keep the floor dry. Clean up any water that spills on the floor as soon as it happens. Remove soap buildup in the tub or shower regularly. Attach bath mats securely with double-sided  non-slip rug tape. Do not have throw rugs and other things on the floor that can make you trip. What can I do in the bedroom? Use night lights. Make sure that you have a light by your bed that is easy to reach. Do not use any sheets or blankets that are too big for your bed. They should not hang down onto the floor. Have a firm chair that has side arms. You can use this for support while you get dressed. Do not have throw rugs and other things on the floor that can make you trip. What can I do in the kitchen? Clean up any spills right away. Avoid walking on wet floors. Keep items that you use a lot in easy-to-reach places. If you need to reach something above you, use a strong step stool that has a grab bar. Keep electrical cords out of the way. Do not use floor polish or wax that makes floors slippery. If you must use wax, use non-skid floor wax. Do not have throw rugs and other things on the floor that can make you trip. What can I do with my stairs? Do not leave any items on the stairs. Make sure that there are handrails on both sides of the stairs and use them. Fix handrails that are broken or loose. Make sure that handrails are as long as the stairways. Check any carpeting to make sure that it is firmly attached to the stairs. Fix any carpet that is  loose or worn. Avoid having throw rugs at the top or bottom of the stairs. If you do have throw rugs, attach them to the floor with carpet tape. Make sure that you have a light switch at the top of the stairs and the bottom of the stairs. If you do not have them, ask someone to add them for you. What else can I do to help prevent falls? Wear shoes that: Do not have high heels. Have rubber bottoms. Are comfortable and fit you well. Are closed at the toe. Do not wear sandals. If you use a stepladder: Make sure that it is fully opened. Do not climb a closed stepladder. Make sure that both sides of the stepladder are locked into place. Ask  someone to hold it for you, if possible. Clearly mark and make sure that you can see: Any grab bars or handrails. First and last steps. Where the edge of each step is. Use tools that help you move around (mobility aids) if they are needed. These include: Canes. Walkers. Scooters. Crutches. Turn on the lights when you go into a dark area. Replace any light bulbs as soon as they burn out. Set up your furniture so you have a clear path. Avoid moving your furniture around. If any of your floors are uneven, fix them. If there are any pets around you, be aware of where they are. Review your medicines with your doctor. Some medicines can make you feel dizzy. This can increase your chance of falling. Ask your doctor what other things that you can do to help prevent falls. This information is not intended to replace advice given to you by your health care provider. Make sure you discuss any questions you have with your health care provider. Document Released: 08/06/2009 Document Revised: 03/17/2016 Document Reviewed: 11/14/2014 Elsevier Interactive Patient Education  2017 [employer].

## 2021-09-02 NOTE — Addendum Note (Signed)
Addended by: Glenna Durand E on: 09/02/2021 12:47 PM   Modules accepted: Orders

## 2021-09-03 ENCOUNTER — Telehealth: Payer: Self-pay

## 2021-09-03 DIAGNOSIS — D631 Anemia in chronic kidney disease: Secondary | ICD-10-CM | POA: Diagnosis not present

## 2021-09-03 DIAGNOSIS — N186 End stage renal disease: Secondary | ICD-10-CM | POA: Diagnosis not present

## 2021-09-03 DIAGNOSIS — N2581 Secondary hyperparathyroidism of renal origin: Secondary | ICD-10-CM | POA: Diagnosis not present

## 2021-09-03 DIAGNOSIS — Z992 Dependence on renal dialysis: Secondary | ICD-10-CM | POA: Diagnosis not present

## 2021-09-03 NOTE — Telephone Encounter (Signed)
Attempted to call patient to inform her that insurance will not cover the Methyphenidate. She could use [email or web address] and get through the pharmacy

## 2021-09-06 DIAGNOSIS — D631 Anemia in chronic kidney disease: Secondary | ICD-10-CM | POA: Diagnosis not present

## 2021-09-06 DIAGNOSIS — N2581 Secondary hyperparathyroidism of renal origin: Secondary | ICD-10-CM | POA: Diagnosis not present

## 2021-09-06 DIAGNOSIS — N186 End stage renal disease: Secondary | ICD-10-CM | POA: Diagnosis not present

## 2021-09-06 DIAGNOSIS — Z992 Dependence on renal dialysis: Secondary | ICD-10-CM | POA: Diagnosis not present

## 2021-09-07 DIAGNOSIS — E1122 Type 2 diabetes mellitus with diabetic chronic kidney disease: Secondary | ICD-10-CM | POA: Diagnosis not present

## 2021-09-07 DIAGNOSIS — N186 End stage renal disease: Secondary | ICD-10-CM | POA: Diagnosis not present

## 2021-09-07 DIAGNOSIS — I132 Hypertensive heart and chronic kidney disease with heart failure and with stage 5 chronic kidney disease, or end stage renal disease: Secondary | ICD-10-CM | POA: Diagnosis not present

## 2021-09-07 DIAGNOSIS — I5033 Acute on chronic diastolic (congestive) heart failure: Secondary | ICD-10-CM | POA: Diagnosis not present

## 2021-09-07 DIAGNOSIS — D631 Anemia in chronic kidney disease: Secondary | ICD-10-CM | POA: Diagnosis not present

## 2021-09-07 DIAGNOSIS — I69354 Hemiplegia and hemiparesis following cerebral infarction affecting left non-dominant side: Secondary | ICD-10-CM | POA: Diagnosis not present

## 2021-09-08 DIAGNOSIS — Z992 Dependence on renal dialysis: Secondary | ICD-10-CM | POA: Diagnosis not present

## 2021-09-08 DIAGNOSIS — N186 End stage renal disease: Secondary | ICD-10-CM | POA: Diagnosis not present

## 2021-09-08 DIAGNOSIS — N2581 Secondary hyperparathyroidism of renal origin: Secondary | ICD-10-CM | POA: Diagnosis not present

## 2021-09-08 DIAGNOSIS — D631 Anemia in chronic kidney disease: Secondary | ICD-10-CM | POA: Diagnosis not present

## 2021-09-10 ENCOUNTER — Other Ambulatory Visit: Payer: Self-pay | Admitting: Physical Medicine and Rehabilitation

## 2021-09-10 ENCOUNTER — Telehealth: Payer: Self-pay

## 2021-09-10 DIAGNOSIS — D631 Anemia in chronic kidney disease: Secondary | ICD-10-CM | POA: Diagnosis not present

## 2021-09-10 DIAGNOSIS — G8194 Hemiplegia, unspecified affecting left nondominant side: Secondary | ICD-10-CM

## 2021-09-10 DIAGNOSIS — N186 End stage renal disease: Secondary | ICD-10-CM | POA: Diagnosis not present

## 2021-09-10 DIAGNOSIS — T6701XS Heatstroke and sunstroke, sequela: Secondary | ICD-10-CM

## 2021-09-10 DIAGNOSIS — Z992 Dependence on renal dialysis: Secondary | ICD-10-CM | POA: Diagnosis not present

## 2021-09-10 DIAGNOSIS — N2581 Secondary hyperparathyroidism of renal origin: Secondary | ICD-10-CM | POA: Diagnosis not present

## 2021-09-10 NOTE — Telephone Encounter (Signed)
Mr. Kuri called to request out-patient physical therapy for his wife. If possible will you please place the order.   Call back phone [phone].

## 2021-09-10 NOTE — Telephone Encounter (Signed)
Done- spoke to him about her- thanks - ML

## 2021-09-13 DIAGNOSIS — N2581 Secondary hyperparathyroidism of renal origin: Secondary | ICD-10-CM | POA: Diagnosis not present

## 2021-09-13 DIAGNOSIS — D631 Anemia in chronic kidney disease: Secondary | ICD-10-CM | POA: Diagnosis not present

## 2021-09-13 DIAGNOSIS — N186 End stage renal disease: Secondary | ICD-10-CM | POA: Diagnosis not present

## 2021-09-13 DIAGNOSIS — Z992 Dependence on renal dialysis: Secondary | ICD-10-CM | POA: Diagnosis not present

## 2021-09-15 DIAGNOSIS — N186 End stage renal disease: Secondary | ICD-10-CM | POA: Diagnosis not present

## 2021-09-15 DIAGNOSIS — Z992 Dependence on renal dialysis: Secondary | ICD-10-CM | POA: Diagnosis not present

## 2021-09-17 DIAGNOSIS — Z992 Dependence on renal dialysis: Secondary | ICD-10-CM | POA: Diagnosis not present

## 2021-09-17 DIAGNOSIS — N186 End stage renal disease: Secondary | ICD-10-CM | POA: Diagnosis not present

## 2021-09-20 DIAGNOSIS — Z992 Dependence on renal dialysis: Secondary | ICD-10-CM | POA: Diagnosis not present

## 2021-09-20 DIAGNOSIS — N186 End stage renal disease: Secondary | ICD-10-CM | POA: Diagnosis not present

## 2021-09-22 DIAGNOSIS — D631 Anemia in chronic kidney disease: Secondary | ICD-10-CM | POA: Diagnosis not present

## 2021-09-22 DIAGNOSIS — N2581 Secondary hyperparathyroidism of renal origin: Secondary | ICD-10-CM | POA: Diagnosis not present

## 2021-09-22 DIAGNOSIS — Z992 Dependence on renal dialysis: Secondary | ICD-10-CM | POA: Diagnosis not present

## 2021-09-22 DIAGNOSIS — N186 End stage renal disease: Secondary | ICD-10-CM | POA: Diagnosis not present

## 2021-09-23 ENCOUNTER — Telehealth: Payer: Self-pay

## 2021-09-23 DIAGNOSIS — N186 End stage renal disease: Secondary | ICD-10-CM | POA: Diagnosis not present

## 2021-09-23 DIAGNOSIS — E1122 Type 2 diabetes mellitus with diabetic chronic kidney disease: Secondary | ICD-10-CM | POA: Diagnosis not present

## 2021-09-23 DIAGNOSIS — Z992 Dependence on renal dialysis: Secondary | ICD-10-CM | POA: Diagnosis not present

## 2021-09-23 NOTE — Telephone Encounter (Signed)
PA submitted for Methylphenidate

## 2021-09-24 ENCOUNTER — Encounter: Payer: Medicare Other | Attending: Registered Nurse | Admitting: Physical Medicine and Rehabilitation

## 2021-09-24 ENCOUNTER — Encounter: Payer: Self-pay | Admitting: Physical Medicine and Rehabilitation

## 2021-09-24 ENCOUNTER — Other Ambulatory Visit: Payer: Self-pay

## 2021-09-24 VITALS — BP 145/77 | HR 80 | Ht 63.0 in | Wt 192.6 lb

## 2021-09-24 DIAGNOSIS — T82898S Other specified complication of vascular prosthetic devices, implants and grafts, sequela: Secondary | ICD-10-CM | POA: Insufficient documentation

## 2021-09-24 DIAGNOSIS — G8194 Hemiplegia, unspecified affecting left nondominant side: Secondary | ICD-10-CM | POA: Insufficient documentation

## 2021-09-24 DIAGNOSIS — Z992 Dependence on renal dialysis: Secondary | ICD-10-CM | POA: Diagnosis not present

## 2021-09-24 DIAGNOSIS — D631 Anemia in chronic kidney disease: Secondary | ICD-10-CM | POA: Diagnosis not present

## 2021-09-24 DIAGNOSIS — R269 Unspecified abnormalities of gait and mobility: Secondary | ICD-10-CM | POA: Insufficient documentation

## 2021-09-24 DIAGNOSIS — N186 End stage renal disease: Secondary | ICD-10-CM | POA: Diagnosis not present

## 2021-09-24 DIAGNOSIS — N2581 Secondary hyperparathyroidism of renal origin: Secondary | ICD-10-CM | POA: Diagnosis not present

## 2021-09-24 DIAGNOSIS — I6381 Other cerebral infarction due to occlusion or stenosis of small artery: Secondary | ICD-10-CM | POA: Diagnosis not present

## 2021-09-24 NOTE — Patient Instructions (Addendum)
[name] with recent hx of L hemiparesis due to R basal ganglia infarct in 7/22; R hip bursitis; anxiety; LGIB; dCHF; on HD from ESRD; HTN, BMI of 34 and DM- A1c 5.2- here for f/u on stroke and R hand weakness/numbness.  Also sedation with stroke.   Do a trial without Ritalin for 5-7 days- and see what occurs- if more drowsy, and sleeping too much, then we need to go back on it.   2.  Regarding steal syndrome of RUE- wants a 2nd opinion.   3. Can use Voltaren gel AND Lidocaine gel mixed together- up to 4x/day-Can use over the counter.   4.  Keppra helps nerve pain- but Lyrica/gabapentin  are likely too sedating and cannot use higher doses in ESRD.    5. Pain is electrical- and sharp/shooting-  Cannot use Tramadol- due to seizure hx/on keppra.con't lidocaine/tylenol.    6. Sees Neuro on Monday- got tramadol in hospital- too sedated per husband.    7. Call to get Neurorehab- has office number. Call me if issues getting that going.    8. F/U - 3 months  9. Looking into Peritoneal dialysis.

## 2021-09-24 NOTE — Progress Notes (Signed)
Subjective:    Patient ID: Carolyn Russo, female    DOB: April 29, 1951, 70 y.o.   MRN: [id number]  HPI [name] with recent hx of L hemiparesis due to R basal ganglia infarct in 7/22; R hip bursitis; anxiety; LGIB; dCHF; on HD from ESRD; HTN, BMI of 34 and DM- A1c 5.2- here for f/u on stroke and R hand weakness/numbness.    Pt now wants to get it undone in RUE fistula- steal syndrome.   Didn't get a chance to get a second opinion.  When went to [place].   Has been taken to OR 2x-  BP dropped 1st time and 2nd time, having these issues from the "steal syndrome".  Dr Doren Custard? Was the vascular surgeon.   Only been doing the Ritalin 1x/day.    Was told scheduled venogram for Monday-  But not going to use as a shunt.    Pain Inventory Average Pain 5 Pain Right Now 5 My pain is burning and aching  In the last 24 hours, has pain interfered with the following? General activity 5 Relation with others 5 Enjoyment of life 5 What TIME of day is your pain at its worst? evening and night Sleep (in general) Good  Pain is worse with:  pressure Pain improves with: heat/ice and therapy/exercise Relief from Meds: 3  Family History  Problem Relation Age of Onset   Cancer Mother        abdominal melamona   Psoriasis Mother    Alzheimer's disease Father    Cancer Cousin        colon    Diabetes Cousin    Diabetes Maternal Aunt    Colon cancer Neg Hx    Colon polyps Neg Hx    Esophageal cancer Neg Hx    Rectal cancer Neg Hx    Stomach cancer Neg Hx    Breast cancer Neg Hx    Social History   Socioeconomic History   Marital status: Married    Spouse name: [name]   Number of children: 1   Years of education: Not on file   Highest education level: Professional school degree (e.g., MD, DDS, DVM, JD)  Occupational History   Occupation: retired  Tobacco Use   Smoking status: Never   Smokeless tobacco: Never  Vaping Use   Vaping Use: Never used  Substance and Sexual  Activity   Alcohol use: No   Drug use: No   Sexual activity: Not Currently    Birth control/protection: Post-menopausal  Other Topics Concern   Not on file  Social History Narrative   06/21/21 lives with husband   Patient reports she used to walk at [employer] but now due to Covid-19 she is staying inside.   Social Determinants of Health   Financial Resource Strain: Low Risk    Difficulty of Paying Living Expenses: Not hard at all  Food Insecurity: No Food Insecurity   Worried About Charity fundraiser in the Last Year: Never true   [place] in the Last Year: Never true  Transportation Needs: No Transportation Needs   Lack of Transportation (Medical): No   Lack of Transportation (Non-Medical): No  Physical Activity: Inactive   Days of Exercise per Week: 0 days   Minutes of Exercise per Session: 0 min  Stress: Stress Concern Present   Feeling of Stress : To some extent  Social Connections: Not on file   Past Surgical History:  Procedure  Laterality Date   APPENDECTOMY  8563   [place] TRANSPOSITION Right 04/30/2021   Procedure: RIGHT FIRST STAGE [place];  Surgeon: Angelia Mould, MD;  Location: [facility];  Service: Vascular;  Laterality: Right;   CESAREAN SECTION  1985   COLONOSCOPY     ESOPHAGOGASTRODUODENOSCOPY (EGD) WITH PROPOFOL N/A 04/18/2021   Procedure: ESOPHAGOGASTRODUODENOSCOPY (EGD) WITH PROPOFOL;  Surgeon: Gatha Mayer, MD;  Location: [facility];  Service: Endoscopy;  Laterality: N/A;   EYE SURGERY     bilateral cataract    FLEXIBLE SIGMOIDOSCOPY N/A 04/18/2021   Procedure: FLEXIBLE SIGMOIDOSCOPY;  Surgeon: Gatha Mayer, MD;  [facility] ENDOSCOPY;  Service: Endoscopy;  Laterality: N/A;   HERNIA REPAIR  1959   IR FLUORO GUIDE CV LINE RIGHT  03/18/2021   IR US GUIDE VASC ACCESS RIGHT  03/18/2021   LEFT HEART CATH AND CORONARY ANGIOGRAPHY N/A 04/04/2017   Procedure: Left Heart Cath and Coronary Angiography;  Surgeon: Belva Crome,  MD;  Location: [facility] CV LAB;  Service: Cardiovascular;  Laterality: N/A;   MYOMECTOMY  1980, 2004, 2007   RHINOPLASTY  1971   ROTATOR CUFF REPAIR  2003   SURGICAL REPAIR OF HEMORRHAGE  2015   TONSILLECTOMY  1968   Past Surgical History:  Procedure Laterality Date   APPENDECTOMY  1497   BASCILIC VEIN TRANSPOSITION Right 04/30/2021   Procedure: RIGHT FIRST STAGE [place];  Surgeon: Angelia Mould, MD;  Location: [facility];  Service: Vascular;  Laterality: Right;   CESAREAN SECTION  1985   COLONOSCOPY     ESOPHAGOGASTRODUODENOSCOPY (EGD) WITH PROPOFOL N/A 04/18/2021   Procedure: ESOPHAGOGASTRODUODENOSCOPY (EGD) WITH PROPOFOL;  Surgeon: Gatha Mayer, MD;  Location: [facility];  Service: Endoscopy;  Laterality: N/A;   EYE SURGERY     bilateral cataract    FLEXIBLE SIGMOIDOSCOPY N/A 04/18/2021   Procedure: FLEXIBLE SIGMOIDOSCOPY;  Surgeon: Gatha Mayer, MD;  [facility] ENDOSCOPY;  Service: Endoscopy;  Laterality: N/A;   HERNIA REPAIR  1959   IR FLUORO GUIDE CV LINE RIGHT  03/18/2021   IR US GUIDE VASC ACCESS RIGHT  03/18/2021   LEFT HEART CATH AND CORONARY ANGIOGRAPHY N/A 04/04/2017   Procedure: Left Heart Cath and Coronary Angiography;  Surgeon: Belva Crome, MD;  Location: [facility] CV LAB;  Service: Cardiovascular;  Laterality: N/A;   MYOMECTOMY  1980, 2004, 2007   RHINOPLASTY  1971   ROTATOR CUFF REPAIR  2003   SURGICAL REPAIR OF HEMORRHAGE  2015   TONSILLECTOMY  1968   Past Medical History:  Diagnosis Date   Acute GI bleeding    Allergy    Anemia    Anterior chest wall pain    Appendicitis 1965   Asthma    Body mass index 37.0-37.9, adult    Breast pain    Cataract    both eyes   CHF (congestive heart failure) ([place])    Chronic kidney disease    stage 5 - on dialysis   Cognitive change 04/20/2021   r/t cva 03/2821   Dehydration 2014   Deviated septum 1971   Diabetes mellitus    no meds   Dyspnea 2014   Extrinsic asthma    WITH  ASTHMA ATTACK   Fibroid 1980   GERD (gastroesophageal reflux disease)    Heart murmur    Hx gestational diabetes    Hyperlipidemia    Hypertension 2014   Inguinal hernia 1959   Malaise and fatigue 2014  Non-IgE mediated allergic asthma 2014   Obesity    Pelvic pain    Pregnancy, high-risk 1985   Stroke ([place]) 04/20/2021   (CVA) of right basal ganglia   Tonsillitis 1968   Uterine fibroid 1980   BP (!) 145/77   Pulse 80   Ht 5\' 3"  (1.6 m)   Wt 192 lb 9.6 oz (87.4 kg)   SpO2 98%   BMI 34.12 kg/m   Opioid Risk Score:   Fall Risk Score:  `1  Depression screen PHQ 2/9  Depression screen [facility] 2/9 09/24/2021 09/02/2021 06/25/2021 11/26/2020 08/12/2020 06/18/2019 03/27/2019  Decreased Interest 1 0 1 0 0 0 0  Down, Depressed, Hopeless 1 0 1 0 0 0 0  PHQ - 2 Score 2 0 2 0 0 0 0  Altered sleeping - - 0 - - - 0  Tired, decreased energy - - 3 - - - 0  Change in appetite - - 0 - - - 0  Feeling bad or failure about yourself  - - 0 - - - 0  Trouble concentrating - - 0 - - - 0  Moving slowly or fidgety/restless - - 0 - - - 0  Suicidal thoughts - - 0 - - - 0  PHQ-9 Score - - 5 - - - 0  Difficult doing work/chores - - Somewhat difficult - - - -  Some recent data might be hidden     Review of Systems  Constitutional: Negative.   HENT: Negative.    Eyes: Negative.   Respiratory: Negative.    Cardiovascular: Negative.   Gastrointestinal: Negative.   Endocrine: Negative.   Genitourinary: Negative.   Musculoskeletal:  Positive for myalgias.       Right hand stays cold  Skin: Negative.   Allergic/Immunologic: Negative.   Neurological:  Positive for weakness.       Had dialysisi today and is lethargic  Hematological: Negative.   Psychiatric/Behavioral: Negative.    All other systems reviewed and are negative.     Objective:   Physical Exam Awake, alert, more interactive; s/p HD day today; accompanied by caregiver, NAD In a clinic w/c.  Using gait belt around her waist.   Accompanied by husband as well  MS: LUE- biceps, triceps, WE, grip and FA 4+/5 LLE- HF, KE.KF 4+/5; DF 4/5; and PF 4-/5 RUE- Biceps/triceps 5/5; WE 5-/5 grip 4-/5, FA 2+/5 RLE- 5-/5 in RLE- better  Neuro: Coordination is certainly impaired- cannot touch thumb to all fingers- only 2nd digit can touch, and fine motor coordination impaired Can do I love you sign a little better.  Decreased to light touch in distal 1/3 of forearm and wrist/hand.  No signs of spasticity.         Assessment & Plan:   [name] with recent hx of L hemiparesis due to R basal ganglia infarct in 7/22; R hip bursitis; anxiety; LGIB; dCHF; on HD from ESRD; HTN, BMI of 34 and DM- A1c 5.2- here for f/u on stroke and R hand weakness/numbness.  Also sedation with stroke.   Do a trial without Ritalin for 5-7 days- and see what occurs- if more drowsy, and sleeping too much, then we need to go back on it.   2.  Regarding steal syndrome of RUE- wants a 2nd opinion.   3. Can use Voltaren gel AND Lidocaine gel mixed together- up to 4x/day-Can use over the counter.   4.  Keppra helps nerve pain- but  Lyrica/gabapentin  are likely too sedating and cannot use higher doses in ESRD.    5. Pain is electrical- and sharp/shooting-  Cannot use Tramadol- due to seizure hx/on keppra.con't lidocaine/tylenol.    6. Sees Neuro on Monday- got tramadol in hospital- too sedated per husband.    7. Call to get Neurorehab- has office number.  Let me know if needs help getting outpt therapy going.   8. F/U - 3 months  9. Looking into Peritoneal dialysis.   I spent a total of 35 minutes on visit- discussing as above.

## 2021-09-27 ENCOUNTER — Encounter: Payer: Self-pay | Admitting: Adult Health

## 2021-09-27 ENCOUNTER — Ambulatory Visit (INDEPENDENT_AMBULATORY_CARE_PROVIDER_SITE_OTHER): Payer: Medicare Other | Admitting: Adult Health

## 2021-09-27 VITALS — BP 150/70 | HR 74 | Ht 63.0 in | Wt 197.0 lb

## 2021-09-27 DIAGNOSIS — R569 Unspecified convulsions: Secondary | ICD-10-CM | POA: Diagnosis not present

## 2021-09-27 DIAGNOSIS — N2581 Secondary hyperparathyroidism of renal origin: Secondary | ICD-10-CM | POA: Diagnosis not present

## 2021-09-27 DIAGNOSIS — Z992 Dependence on renal dialysis: Secondary | ICD-10-CM | POA: Diagnosis not present

## 2021-09-27 DIAGNOSIS — D631 Anemia in chronic kidney disease: Secondary | ICD-10-CM | POA: Diagnosis not present

## 2021-09-27 DIAGNOSIS — N186 End stage renal disease: Secondary | ICD-10-CM | POA: Diagnosis not present

## 2021-09-27 DIAGNOSIS — I69398 Other sequelae of cerebral infarction: Secondary | ICD-10-CM

## 2021-09-27 DIAGNOSIS — I639 Cerebral infarction, unspecified: Secondary | ICD-10-CM

## 2021-09-27 NOTE — Progress Notes (Signed)
[facility] [address] [place]. [place] [address] [phone]       STROKE FOLLOW UP NOTE  [name] Date of Birth:  1951/03/17 Medical Record Number:  [id number]   Reason for Referral: stroke follow up    SUBJECTIVE:   CHIEF COMPLAINT:  Chief Complaint  Patient presents with   Follow-up    RM 2 with spouse roy Pt is well, has been improving with PT. No worsening/new symptoms.     HPI:   Update 09/27/2021 JM: Returns for 12-month stroke follow-up accompanied by her husband.  Overall stable from stroke standpoint -denies new stroke/TIA symptoms Residual mild left-sided weakness but overall greatly improving since prior visit.  Does have continued left shoulder pain with limited ROM.  Ambulates short distance with RW -denies any recent falls.  Receives aide assistance 5x/week while husband at works - he cares for her in the evenings/nights and on weekends. Completed HH PT/OT last month- currently waiting to schedule PT/OT neuro rehab Remains on Plavix and Crestor -denies side effects Blood pressure today 150/70  Compliant on Keppra -denies side effects.  No seizure activity  Continued right hand weakness and sensory impairment with dysesthesias s/p fistula placement. Does report some improvement since prior visit. Plans on trying mix of lidocaine and voltaren gel as recommended by PMR at recent visit. Followed by [facility] who recommended taking fistula back down but she is still hesitant to undergo surgery.  Currently waiting to be scheduled for second opinion  Continued HD thrice weekly - c/o nausea during sessions   No new concerns at this time    History provided for reference purposes only Initial visit 06/21/2021 JM: Carolyn Russo is being seen for hospital follow-up accompanied by her husband, [name].  Overall doing well.  Reports improvement of left-sided weakness working with [facility] PT and just started OT last week. She does c/o left shoulder pain  with limited ROM. Ambulates short distance with RW. Does have aide assistance for ADLs.  Greatest concern today is in regards to right hand weakness s/p fistula placement. Seen by [facility] 8/17 again offering medication of the fistula and then planning new access in the future but she remains hesitant at this time to undergo any further surgical interventions. Recommended hand therapy for possible improvement.  Denies new stroke/TIA symptoms.  Remains on aspirin and Plavix as well as Crestor without side effects.  Blood pressure today 144/80. Remains on keppra 500mg  nightly without seizure activity and tolerating without side effects.  Continued HD thrice weekly.  No further concerns at this time.  Stroke admission 04/16/2021 Carolyn Russo is a 70 y.o. female with PMHx of obesity, DM, HTN, HLD, ESRD on HD who presented with left sided weakness for 3 days on 04/16/2021. Personally reviewed hospitalization pertinent progress notes, lab work and imaging. Evaluated by Dr. Erlinda Hong for subcortical infarct on the right, likely secondary to small vessel disease. LDL 70. A1c 5.2. MRA head/neck unremarkable. EF 50% on 02/2021. on aspirin PTA and recommended DAPT for 3 weeks then Plavix alone. Continue Coreg, Crestor 20mg  daily and Novolog. Other stroke risk factors include advanced age and obesity. Other active problems include ESRD on HD, chronic hip pain, asthma and GERD. Residual deficits of left hemiparesis. Eval by therapies recommended CIR and dc'd to CIR on 04/20/2021. Initally recovering well, however, on 7/13 she was found to have AMS, worsening left-sided weakness, left hemineglect and right gaze deviation after HD session. MR brain and CT head no  evidence of acute infarct.  EEG showed right central parietal region spikes, consistent with potential epileptogenic focus. Started on Keppra 500mg  nightly for likely seizure with postictal state. Underwent right basilic vein fistula placement by vascular surgery on 7/8 and  subsequently developed right hand numbness and weakness.  Evaluated by neurology Dr. 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 [facility] recommended ligation of fistula but pt declined. She returned back to CIR on on 7/15 and dc'd home on 8/4.     Pertinent imaging  MR BRAIN 04/16/2021 IMPRESSION: 1. 2.1 cm acute ischemic nonhemorrhagic right basal ganglia infarct. 2. Underlying moderate chronic microvascular ischemic disease.  MR ANGIO HEAD WO CONTRAST MR ANGIO NECK WO CONTRAST 04/17/2021 IMPRESSION: 1. Technically limited exam due to extensive motion artifact. 2. Grossly negative MRA of the head and neck. No large vessel occlusion. No proximal high-grade or correctable stenosis.  CT HEAD 05/03/2021 IMPRESSION: No acute CT finding. Subacute infarction in the right lateral thalamus/radiating white matter tracts is more clearly visible but there is no sign of extension or hemorrhage. Chronic small-vessel ischemic changes elsewhere affecting the cerebral hemispheric white matter.  CT HEAD 05/05/2021 IMPRESSION: 1. No acute intracranial pathology. 2. Age-related atrophy and chronic microvascular ischemic changes. Similar appearance of focal area of old infarct lateral to the right thalamus.  CTA HEAD/NECK 05/05/2021 CT PERFUSION IMPRESSION: 1. Negative CTA for emergent large vessel occlusion. 2. Mild atheromatous change about the major arterial vasculature of the head and neck as above. No proximal high-grade or correctable stenosis. 3. 2 mm focal outpouching arising from the proximal cavernous right ICA, which could reflect a small aneurysm versus vascular infundibulum. Attention at follow-up recommended. 4. CT perfusion portion of this exam unfortunately failed due to technical difficulty. No perfusion maps were generated.  MR BRAIN 05/06/2021 IMPRESSION: No acute infarction. Evolving prior infarction of the right corona radiata and basal  ganglia. Chronic microvascular ischemic changes.  EEG 05/06/2021 IMPRESSION: This study showed evidence of potential epileptogenicity arising from right centro-parietal region. There is also cortical dysfunction arising from right hemisphere likely secondary to underlying stroke. Additionally, there is mild diffuse encephalopathy, nonspecific etiology. No seizures were seen throughout the recording.      ROS:   14 system review of systems performed and negative with exception of those listed in HPI  PMH:  Past Medical History:  Diagnosis Date   Acute GI bleeding    Allergy    Anemia    Anterior chest wall pain    Appendicitis 1965   Asthma    Body mass index 37.0-37.9, adult    Breast pain    Cataract    both eyes   CHF (congestive heart failure) ([place])    Chronic kidney disease    stage 5 - on dialysis   Cognitive change 04/20/2021   r/t cva 03/2821   Dehydration 2014   Deviated septum 1971   Diabetes mellitus    no meds   Dyspnea 2014   Extrinsic asthma    WITH ASTHMA ATTACK   Fibroid 1980   GERD (gastroesophageal reflux disease)    Heart murmur    Hx gestational diabetes    Hyperlipidemia    Hypertension 2014   Inguinal hernia 1959   Malaise and fatigue 2014   Non-IgE mediated allergic asthma 2014   Obesity    Pelvic pain    Pregnancy, high-risk 1985   Stroke ([place]) 04/20/2021   (CVA) of right basal ganglia   Tonsillitis 1968  Uterine fibroid 1980    PSH:  Past Surgical History:  Procedure Laterality Date   APPENDECTOMY  5462   [place] Right 04/30/2021   Procedure: RIGHT FIRST STAGE [place];  Surgeon: Angelia Mould, MD;  Location: [place];  Service: Vascular;  Laterality: Right;   CESAREAN SECTION  1985   COLONOSCOPY     ESOPHAGOGASTRODUODENOSCOPY (EGD) WITH PROPOFOL N/A 04/18/2021   Procedure: ESOPHAGOGASTRODUODENOSCOPY (EGD) WITH PROPOFOL;  Surgeon: Gatha Mayer, MD;  Location: [place];  Service:  Endoscopy;  Laterality: N/A;   EYE SURGERY     bilateral cataract    FLEXIBLE SIGMOIDOSCOPY N/A 04/18/2021   Procedure: FLEXIBLE SIGMOIDOSCOPY;  Surgeon: Gatha Mayer, MD;  Location: [facility];  Service: Endoscopy;  Laterality: N/A;   HERNIA REPAIR  1959   IR FLUORO GUIDE CV LINE RIGHT  03/18/2021   IR US GUIDE VASC ACCESS RIGHT  03/18/2021   LEFT HEART CATH AND CORONARY ANGIOGRAPHY N/A 04/04/2017   Procedure: Left Heart Cath and Coronary Angiography;  Surgeon: Belva Crome, MD;  Location: [place] CV LAB;  Service: Cardiovascular;  Laterality: N/A;   MYOMECTOMY  1980, 2004, 2007   RHINOPLASTY  1971   ROTATOR CUFF REPAIR  2003   SURGICAL REPAIR OF HEMORRHAGE  2015   TONSILLECTOMY  1968    Social History:  Social History   Socioeconomic History   Marital status: Married    Spouse name: [name]   Number of children: 1   Years of education: Not on file   Highest education level: Professional school degree (e.g., MD, DDS, DVM, JD)  Occupational History   Occupation: retired  Tobacco Use   Smoking status: Never   Smokeless tobacco: Never  Vaping Use   Vaping Use: Never used  Substance and Sexual Activity   Alcohol use: No   Drug use: No   Sexual activity: Not Currently    Birth control/protection: Post-menopausal  Other Topics Concern   Not on file  Social History Narrative   06/21/21 lives with husband   Patient reports she used to walk at [employer] but now due to Covid-19 she is staying inside.   Social Determinants of Health   Financial Resource Strain: Low Risk    Difficulty of Paying Living Expenses: Not hard at all  Food Insecurity: No Food Insecurity   Worried About Charity fundraiser in the Last Year: Never true   [place] in the Last Year: Never true  Transportation Needs: No Transportation Needs   Lack of Transportation (Medical): No   Lack of Transportation (Non-Medical): No  Physical Activity: Inactive   Days of Exercise per Week: 0 days   Minutes  of Exercise per Session: 0 min  Stress: Stress Concern Present   Feeling of Stress : To some extent  Social Connections: Not on file  Intimate Partner Violence: Not on file    Family History:  Family History  Problem Relation Age of Onset   Cancer Mother        abdominal melamona   Psoriasis Mother    Alzheimer's disease Father    Cancer Cousin        colon    Diabetes Cousin    Diabetes Maternal Aunt    Colon cancer Neg Hx    Colon polyps Neg Hx    Esophageal cancer Neg Hx    Rectal cancer Neg Hx    Stomach cancer Neg Hx    Breast cancer  Neg Hx     Medications:   Current Outpatient Medications on File Prior to Visit  Medication Sig Dispense Refill   acetaminophen (OFIRMEV) 10 MG/ML SOLN Take by mouth.     albuterol (VENTOLIN HFA) 108 (90 Base) MCG/ACT inhaler Inhale 2 puffs into the lungs every 4 (four) hours as needed for wheezing or shortness of breath. 18 g 3   BESIVANCE 0.6 % SUSP Place 1 drop into both eyes See admin instructions. After  eye injections     carvedilol (COREG) 3.125 MG tablet Take 1 tablet (3.125 mg total) by mouth 2 (two) times daily with a meal. 60 tablet 0   clopidogrel (PLAVIX) 75 MG tablet Take 1 tablet (75 mg total) by mouth daily. 90 tablet 1   Continuous Blood Gluc Receiver (FREESTYLE LIBRE 2 READER) DEVI by Does not apply route.     diclofenac Sodium (VOLTAREN) 1 % GEL Apply 2 g topically 4 (four) times daily. To left wrist 150 g 0   diphenhydrAMINE (BENADRYL) 25 MG tablet Take 25 mg by mouth every 6 (six) hours as needed for itching.     fluticasone (FLONASE) 50 MCG/ACT nasal spray Place 2 sprays into the nose daily as needed for allergies.      hydrALAZINE (APRESOLINE) 10 MG tablet Take 1 tablet (10 mg total) by mouth every 8 (eight) hours as needed (Sys BP >180, hold on HD days). 90 tablet 0   hydrocortisone (ANUSOL-HC) 2.5 % rectal cream Place 1 application rectally 2 (two) times daily as needed for hemorrhoids or anal itching. 30 g 1    hydrocortisone cream 1 % Apply to affected area 2 times daily 30 g 1   hydrOXYzine (ATARAX/VISTARIL) 25 MG tablet Take 1 tablet (25 mg total) by mouth at bedtime as needed. 30 tablet 2   hydrOXYzine (VISTARIL) 25 MG capsule Take 25 mg by mouth 3 (three) times daily as needed. Dose unknown     insulin aspart protamine - aspart (NOVOLOG MIX 70/30 FLEXPEN) (70-30) 100 UNIT/ML FlexPen 35uam / 5u/25upm     insulin glargine (LANTUS) 100 UNIT/ML Solostar Pen Inject 13 Units into the skin daily. 15 mL 2   Insulin Pen Needle (BD PEN NEEDLE NANO 2ND GEN) 32G X 4 MM MISC See admin instructions.     iron sucrose in sodium chloride 0.9 % 100 mL Inject into the vein.     levETIRAcetam (KEPPRA) 500 MG tablet Take 1 tablet (500 mg total) by mouth at bedtime. 90 tablet 1   melatonin 3 MG TABS tablet Take 1 tablet (3 mg total) by mouth at bedtime. 30 tablet 0   Methoxy PEG-Epoetin Beta (MIRCERA IJ) Mircera     methylphenidate (RITALIN) 5 MG tablet Take 1 tablet (5 mg total) by mouth 2 (two) times daily with breakfast and lunch. 60 tablet 0   mometasone-formoterol (DULERA) 200-5 MCG/ACT AERO Inhale 2 puffs into the lungs 2 (two) times daily. 3 each 2   Nutritional Supplements (,FEEDING SUPPLEMENT, PROSOURCE PLUS) liquid Take 30 mLs by mouth 2 (two) times daily between meals. 887 mL 0   ondansetron (ZOFRAN) 4 MG tablet Take 1 tablet (4 mg total) by mouth every 6 (six) hours as needed for nausea or vomiting. 30 tablet 0   OneTouch Delica Lancets 44Y MISC Use as directed to check blood sugars before breakfast and dinner dx: e11.65 100 each 11   pantoprazole (PROTONIX) 40 MG tablet Take 1 tablet (40 mg total) by mouth daily. 90 tablet 2  phenylephrine (,USE FOR PREPARATION-H,) 0.25 % suppository Place 1 suppository rectally 2 (two) times daily. 12 suppository 0   polyethylene glycol powder (GLYCOLAX/MIRALAX) 17 GM/SCOOP powder Take 17 g by mouth daily. 510 g 0   rosuvastatin (CRESTOR) 20 MG tablet Take 1 tablet (20 mg  total) by mouth daily. 90 tablet 2   senna-docusate (SENOKOT-S) 8.6-50 MG tablet Take 2 tablets by mouth 2 (two) times daily. 360 tablet 2   [DISCONTINUED] iron polysaccharides (NIFEREX) 150 MG capsule Take 1 capsule (150 mg total) by mouth daily. (Patient not taking: No sig reported) 30 capsule 12   No current facility-administered medications on file prior to visit.    Allergies:   Allergies  Allergen Reactions   Food Anaphylaxis    Peanuts; Almonds   Other Shortness Of Breath    Peanuts; Almonds   Wheat Bran Shortness Of Breath    Other reaction(s): shortness of breath, itching   Statins Itching and Other (See Comments)    Generalized aches Other reaction(s): aches   Iodine Other (See Comments)    The patient denied this allergy to me on 03/16/2021 Other reaction(s): Unknown   Shellfish Allergy Other (See Comments)    Mouth gets raw   Sitagliptin     Other reaction(s): Unknown Other reaction(s): Unknown   Tetracycline Other (See Comments)    Raw mouth   Tetracycline Hcl     Other reaction(s): "raw mouth"   Tetracyclines & Related Itching   Contrast Media [Iodinated Diagnostic Agents] Rash    Happened during CT scan over 30 years ago      OBJECTIVE:  Physical Exam  Vitals:   09/27/21 1501  BP: (!) 150/70  Pulse: 74  Weight: 197 lb (89.4 kg)  Height: 5\' 3"  (1.6 m)   Body mass index is 34.9 kg/m. No results found.  General: well developed, well nourished, pleasant elderly African-American female, seated, in no evident distress Head: head normocephalic and atraumatic.   Neck: supple with no carotid or supraclavicular bruits Cardiovascular: regular rate and rhythm, no murmurs Musculoskeletal: right hand weakness with increased tone s/p fistula placement Skin:  no rash/petichiae Vascular:  Normal pulses all extremities   Neurologic Exam Mental Status: Awake and fully alert. Fluent speech and language. Oriented to place and time. Recent and remote memory  intact. Attention span, concentration and fund of knowledge appropriate although husband providing majority of history. Mood and affect appropriate and cooperative with exam.  Cranial Nerves: Pupils equal, briskly reactive to light. Extraocular movements full without nystagmus. Visual fields full to confrontation testing. Hearing intact. Facial sensation intact.  Mild left lower facial weakness.  Tongue, palate moves normally and symmetrically.  Motor: LUE: 3/5 deltoid (limited eval d/t pain), 4+/5 elbow flexion and extension, 4/5 grip strength. LLE: 4/5 hip flexor weakness; RUE: 3/5 grip strength s/p fistula placement; RLE: 5/5 Sensory.: intact to touch , pinprick , position and vibratory sensation.  Coordination: Rapid alternating movements decreased in upper extremities bilaterally. Finger-to-nose difficulty performing upper extremities and heel-to-shin performed accurately bilaterally. Gait and Station: Deferred as RW not present Reflexes: 1+ and symmetric. Toes downgoing.         ASSESSMENT: Carolyn Russo is a 70 y.o. year old female with R BG/CR infarct secondary to small vessel disease on 04/16/2021 after presenting with 3 days of left sided weakness. Possible seizure on 7/13  for AMS after HD session.  Vascular risk factors include HTN, HLD, DM, ESRD on HD.      PLAN:  R BG/CR infarct :  Residual deficit: Left hemiparesis with ongoing improvement. Start neuro rehab PT/OT - advised husband to schedule initial evals prior to leaving building today continue clopidogrel 75 mg daily  and Crestor for secondary stroke prevention. Managed by PCP  Discussed secondary stroke prevention measures and importance of close PCP follow up for aggressive stroke risk factor management including HTN with BP goal<130/90, HLD with LDL goal<70 and DM with A1c goal<7. I have gone over the pathophysiology of stroke, warning signs and symptoms, risk factors and their management in some detail with instructions  to go to the closest emergency room for symptoms of concern. Post stroke seizures: Continue Keppra 500 mg nightly (renal dose) for seizure prophylaxis. May consider decreasing dose at follow up visit if remains stable R arm weakness s/p fistula placement: ?  Subclavian steal syndrome.  Initial eval by Dr. 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  Continues to decline interest in fistula litigation. Looking into 2nd opinion by [facility] as she continues to decline repeat surgical procedure.  Symptom management per PMR    Follow up in 6 months or call earlier if needed   CC:  PCP: [name], MD    I spent 36 minutes of face-to-face and non-face-to-face time with patient and husband.  This included previsit chart review, lab review, study review, electronic health record documentation, patient and husband education regarding prior stroke and etiology, residual deficits and typical recovery time, secondary stroke prevention measures and importance of managing stroke risk factors, right arm weakness s/p fistula placement and answered all questions to patient and husband's satisfaction  Frann Rider, AGNP-BC  [facility] [address] [place], [place] [address]  Phone [phone] Fax [phone] Note: This document was prepared with digital dictation and possible smart phrase technology. Any transcriptional errors that result from this process are unintentional.

## 2021-09-27 NOTE — Patient Instructions (Signed)
Continue clopidogrel 75 mg daily  and Crestor 20mg  daily  for secondary stroke prevention  Continue to follow up with PCP regarding cholesterol, blood pressure and diabetes management  Maintain strict control of hypertension with blood pressure goal below 130/90, diabetes with hemoglobin A1c goal below 7.0% and cholesterol with LDL cholesterol (bad cholesterol) goal below 70 mg/dL.   Continue keppra 500mg  nightly for seizure prevention - we can discuss decreasing this medication at the next visit      Followup in the future with me in 6 months or call earlier if needed       Thank you for coming to see [place] at [facility]. I hope we have been able to provide you high quality care today.  You may receive a patient satisfaction survey over the next few weeks. We would appreciate your feedback and comments so that we may continue to improve ourselves and the health of our patients.

## 2021-09-27 NOTE — Telephone Encounter (Signed)
[employer] approved Methylphenidate. Approved 09/23/21-09/23/24

## 2021-09-29 ENCOUNTER — Ambulatory Visit: Payer: Medicare Other | Admitting: Nurse Practitioner

## 2021-10-01 DIAGNOSIS — N2581 Secondary hyperparathyroidism of renal origin: Secondary | ICD-10-CM | POA: Diagnosis not present

## 2021-10-01 DIAGNOSIS — D631 Anemia in chronic kidney disease: Secondary | ICD-10-CM | POA: Diagnosis not present

## 2021-10-01 DIAGNOSIS — N186 End stage renal disease: Secondary | ICD-10-CM | POA: Diagnosis not present

## 2021-10-01 DIAGNOSIS — Z992 Dependence on renal dialysis: Secondary | ICD-10-CM | POA: Diagnosis not present

## 2021-10-04 DIAGNOSIS — D631 Anemia in chronic kidney disease: Secondary | ICD-10-CM | POA: Diagnosis not present

## 2021-10-04 DIAGNOSIS — N2581 Secondary hyperparathyroidism of renal origin: Secondary | ICD-10-CM | POA: Diagnosis not present

## 2021-10-04 DIAGNOSIS — Z992 Dependence on renal dialysis: Secondary | ICD-10-CM | POA: Diagnosis not present

## 2021-10-04 DIAGNOSIS — N186 End stage renal disease: Secondary | ICD-10-CM | POA: Diagnosis not present

## 2021-10-06 DIAGNOSIS — N2581 Secondary hyperparathyroidism of renal origin: Secondary | ICD-10-CM | POA: Diagnosis not present

## 2021-10-06 DIAGNOSIS — Z992 Dependence on renal dialysis: Secondary | ICD-10-CM | POA: Diagnosis not present

## 2021-10-06 DIAGNOSIS — D631 Anemia in chronic kidney disease: Secondary | ICD-10-CM | POA: Diagnosis not present

## 2021-10-06 DIAGNOSIS — N186 End stage renal disease: Secondary | ICD-10-CM | POA: Diagnosis not present

## 2021-10-07 ENCOUNTER — Ambulatory Visit: Payer: Medicare Other

## 2021-10-08 DIAGNOSIS — Z992 Dependence on renal dialysis: Secondary | ICD-10-CM | POA: Diagnosis not present

## 2021-10-08 DIAGNOSIS — N186 End stage renal disease: Secondary | ICD-10-CM | POA: Diagnosis not present

## 2021-10-08 DIAGNOSIS — D631 Anemia in chronic kidney disease: Secondary | ICD-10-CM | POA: Diagnosis not present

## 2021-10-08 DIAGNOSIS — N2581 Secondary hyperparathyroidism of renal origin: Secondary | ICD-10-CM | POA: Diagnosis not present

## 2021-10-11 DIAGNOSIS — D631 Anemia in chronic kidney disease: Secondary | ICD-10-CM | POA: Diagnosis not present

## 2021-10-11 DIAGNOSIS — N186 End stage renal disease: Secondary | ICD-10-CM | POA: Diagnosis not present

## 2021-10-11 DIAGNOSIS — N2581 Secondary hyperparathyroidism of renal origin: Secondary | ICD-10-CM | POA: Diagnosis not present

## 2021-10-11 DIAGNOSIS — Z992 Dependence on renal dialysis: Secondary | ICD-10-CM | POA: Diagnosis not present

## 2021-10-12 DIAGNOSIS — E7800 Pure hypercholesterolemia, unspecified: Secondary | ICD-10-CM | POA: Diagnosis not present

## 2021-10-12 DIAGNOSIS — E1165 Type 2 diabetes mellitus with hyperglycemia: Secondary | ICD-10-CM | POA: Diagnosis not present

## 2021-10-13 ENCOUNTER — Ambulatory Visit: Payer: Medicare Other | Admitting: Internal Medicine

## 2021-10-13 DIAGNOSIS — Z992 Dependence on renal dialysis: Secondary | ICD-10-CM | POA: Diagnosis not present

## 2021-10-13 DIAGNOSIS — N2581 Secondary hyperparathyroidism of renal origin: Secondary | ICD-10-CM | POA: Diagnosis not present

## 2021-10-13 DIAGNOSIS — N186 End stage renal disease: Secondary | ICD-10-CM | POA: Diagnosis not present

## 2021-10-13 DIAGNOSIS — D631 Anemia in chronic kidney disease: Secondary | ICD-10-CM | POA: Diagnosis not present

## 2021-10-14 DIAGNOSIS — E1165 Type 2 diabetes mellitus with hyperglycemia: Secondary | ICD-10-CM | POA: Diagnosis not present

## 2021-10-16 DIAGNOSIS — N2581 Secondary hyperparathyroidism of renal origin: Secondary | ICD-10-CM | POA: Diagnosis not present

## 2021-10-16 DIAGNOSIS — Z992 Dependence on renal dialysis: Secondary | ICD-10-CM | POA: Diagnosis not present

## 2021-10-16 DIAGNOSIS — N186 End stage renal disease: Secondary | ICD-10-CM | POA: Diagnosis not present

## 2021-10-16 DIAGNOSIS — D631 Anemia in chronic kidney disease: Secondary | ICD-10-CM | POA: Diagnosis not present

## 2021-10-18 DIAGNOSIS — N186 End stage renal disease: Secondary | ICD-10-CM | POA: Diagnosis not present

## 2021-10-18 DIAGNOSIS — Z992 Dependence on renal dialysis: Secondary | ICD-10-CM | POA: Diagnosis not present

## 2021-10-18 DIAGNOSIS — N2581 Secondary hyperparathyroidism of renal origin: Secondary | ICD-10-CM | POA: Diagnosis not present

## 2021-10-18 DIAGNOSIS — D631 Anemia in chronic kidney disease: Secondary | ICD-10-CM | POA: Diagnosis not present

## 2021-10-20 DIAGNOSIS — D631 Anemia in chronic kidney disease: Secondary | ICD-10-CM | POA: Diagnosis not present

## 2021-10-20 DIAGNOSIS — N186 End stage renal disease: Secondary | ICD-10-CM | POA: Diagnosis not present

## 2021-10-20 DIAGNOSIS — Z992 Dependence on renal dialysis: Secondary | ICD-10-CM | POA: Diagnosis not present

## 2021-10-20 DIAGNOSIS — N2581 Secondary hyperparathyroidism of renal origin: Secondary | ICD-10-CM | POA: Diagnosis not present

## 2021-10-21 ENCOUNTER — Ambulatory Visit: Payer: Medicare Other | Admitting: Occupational Therapy

## 2021-10-21 ENCOUNTER — Encounter: Payer: Self-pay | Admitting: Physical Therapy

## 2021-10-21 ENCOUNTER — Ambulatory Visit: Payer: Medicare Other | Attending: Internal Medicine | Admitting: Physical Therapy

## 2021-10-21 ENCOUNTER — Other Ambulatory Visit: Payer: Self-pay

## 2021-10-21 DIAGNOSIS — R208 Other disturbances of skin sensation: Secondary | ICD-10-CM

## 2021-10-21 DIAGNOSIS — R278 Other lack of coordination: Secondary | ICD-10-CM

## 2021-10-21 DIAGNOSIS — R2689 Other abnormalities of gait and mobility: Secondary | ICD-10-CM

## 2021-10-21 DIAGNOSIS — M6281 Muscle weakness (generalized): Secondary | ICD-10-CM | POA: Insufficient documentation

## 2021-10-21 DIAGNOSIS — M79641 Pain in right hand: Secondary | ICD-10-CM

## 2021-10-21 DIAGNOSIS — R2681 Unsteadiness on feet: Secondary | ICD-10-CM | POA: Diagnosis not present

## 2021-10-21 DIAGNOSIS — M25512 Pain in left shoulder: Secondary | ICD-10-CM | POA: Diagnosis not present

## 2021-10-21 DIAGNOSIS — I69354 Hemiplegia and hemiparesis following cerebral infarction affecting left non-dominant side: Secondary | ICD-10-CM | POA: Insufficient documentation

## 2021-10-21 DIAGNOSIS — G8194 Hemiplegia, unspecified affecting left nondominant side: Secondary | ICD-10-CM | POA: Insufficient documentation

## 2021-10-21 NOTE — Therapy (Signed)
[facility] [address] [place], [name], [address] Phone: [phone]   Fax:  [phone]  Occupational Therapy Evaluation  Patient Details  Name: Carolyn Russo MRN: [id number] Date of Birth: 10/01/1951 Referring Provider (OT): Dr. Dagoberto Ligas   Encounter Date: 10/21/2021   OT End of Session - 10/21/21 1343     Visit Number 1    Number of Visits 25    Date for OT Re-Evaluation 01/13/22    Authorization Type Medicare, State BCBS    Authorization - Visit Number 1    Authorization - Number of Visits 10    Progress Note Due on Visit 10    OT Start Time 1149    OT Stop Time 1228    OT Time Calculation (min) 39 min             Past Medical History:  Diagnosis Date   Acute GI bleeding    Allergy    Anemia    Anterior chest wall pain    Appendicitis 1965   Asthma    Body mass index 37.0-37.9, adult    Breast pain    Cataract    both eyes   CHF (congestive heart failure) ([place])    Chronic kidney disease    stage 5 - on dialysis   Cognitive change 04/20/2021   r/t cva 03/2821   Dehydration 2014   Deviated septum 1971   Diabetes mellitus    no meds   Dyspnea 2014   Extrinsic asthma    WITH ASTHMA ATTACK   Fibroid 1980   GERD (gastroesophageal reflux disease)    Heart murmur    Hx gestational diabetes    Hyperlipidemia    Hypertension 2014   Inguinal hernia 1959   Malaise and fatigue 2014   Non-IgE mediated allergic asthma 2014   Obesity    Pelvic pain    Pregnancy, high-risk 1985   Stroke ([place]) 04/20/2021   (CVA) of right basal ganglia   Tonsillitis 1968   Uterine fibroid 1980    Past Surgical History:  Procedure Laterality Date   APPENDECTOMY  8003   BASCILIC VEIN TRANSPOSITION Right 04/30/2021   Procedure: RIGHT FIRST STAGE [place];  Surgeon: Angelia Mould, MD;  Location: [facility] OR;  Service: Vascular;  Laterality: Right;   CESAREAN SECTION  1985   COLONOSCOPY      ESOPHAGOGASTRODUODENOSCOPY (EGD) WITH PROPOFOL N/A 04/18/2021   Procedure: ESOPHAGOGASTRODUODENOSCOPY (EGD) WITH PROPOFOL;  Surgeon: Gatha Mayer, MD;  Location: [name] ENDOSCOPY;  Service: Endoscopy;  Laterality: N/A;   EYE SURGERY     bilateral cataract    FLEXIBLE SIGMOIDOSCOPY N/A 04/18/2021   Procedure: FLEXIBLE SIGMOIDOSCOPY;  Surgeon: Gatha Mayer, MD;  Location: [facility] ENDOSCOPY;  Service: Endoscopy;  Laterality: N/A;   HERNIA REPAIR  1959   IR FLUORO GUIDE CV LINE RIGHT  03/18/2021   IR US GUIDE VASC ACCESS RIGHT  03/18/2021   LEFT HEART CATH AND CORONARY ANGIOGRAPHY N/A 04/04/2017   Procedure: Left Heart Cath and Coronary Angiography;  Surgeon: Belva Crome, MD;  Location: [facility] LAB;  Service: Cardiovascular;  Laterality: N/A;   MYOMECTOMY  1980, 2004, 2007   RHINOPLASTY  1971   ROTATOR CUFF REPAIR  2003   SURGICAL REPAIR OF HEMORRHAGE  2015   TONSILLECTOMY  1968    There were no vitals filed for this visit.   Subjective Assessment - 10/21/21 1152     Subjective  Pt reports  numbness and tingling in R hand    Pertinent History Carolyn Russo is a 70 y.o. female with PMHx of obesity, DM, HTN, HLD, ESRD on HD who presented with left sided weakness for 3 days on 04/16/2021. Pt was diagnosed with R basal ganglia infarct Pt went to rehab but then transferred back to acute service on 7/13 for likely seizure. Pt returned to CIR and was d/c home on 05/27/21. Pt received HHOT and PT. Pt has R hand weakness s/p fistula placement. PMH: diabetes, hypertension, hyperlipidemia, ESRD on HD MWF.    Patient Stated Goals to be like I was prior to my stroke    Currently in Pain? Yes    Pain Score 7     Pain Location Hand   arm   Pain Orientation Right    Pain Descriptors / Indicators Tingling    Pain Type Neuropathic pain    Pain Onset More than a month ago    Pain Frequency Intermittent    Aggravating Factors  pressing thorugh hand    Pain Relieving Factors rest    Multiple Pain Sites  Yes    Pain Score 8    Pain Location Shoulder    Pain Orientation Left    Pain Descriptors / Indicators Aching    Pain Type Acute pain    Pain Onset More than a month ago    Pain Frequency Intermittent    Aggravating Factors  raising arm    Pain Relieving Factors rest               [facility] OT Assessment - 10/21/21 1157       Assessment   Medical Diagnosis Rt basal ganglia CVA, steal syndrome RUE s/p attempted fistula placement    Referring Provider (OT) Dr. Dagoberto Ligas    Onset Date/Surgical Date 04/16/21    Hand Dominance Right      Precautions   Precautions Fall    Precaution Comments No driving, portacath      Balance Screen   Has the patient fallen in the past 6 months No    Has the patient had a decrease in activity level because of a fear of falling?  Yes    Is the patient reluctant to leave their home because of a fear of falling?  No      Home  Environment   Family/patient expects to be discharged to: Private residence    Lives With Spouse   has hired Teaching laboratory technician     Prior Function   Level of Independence Needs assistance with ADLs;Needs assistance with gait;Needs assistance with transfers      ADL   Eating/Feeding Needs assist with cutting food   currently using LUE   Grooming Set up    Upper Body Bathing Maximal assistance    Lower Body Bathing Maximal assistance    Upper Body Dressing Maximal assistance    Lower Body Dressing Maximal assistance    Toilet Transfer Minimal assistance    Tub/Shower Transfer --   na     IADL   Shopping Completely unable to shop    Light Housekeeping Does not participate in any housekeeping tasks    Meal Prep Needs to have meals prepared and served   has been cooking but therapist recommends pt does not cook for safety due to sensory deficits     Mobility   Mobility Status Needs assist   min A   Mobility Status Comments uses RW  Written Expression   Dominant Hand Right    Handwriting --   unable with regular pen      Vision Assessment   Vision Assessment Vision not tested      Activity Tolerance   Activity Tolerance --   endurance limits functional activity     Cognition   Overall Cognitive Status Within Functional Limits for tasks assessed      Sensation   Light Touch Impaired by gross assessment   for R hand     Coordination   Gross Motor Movements are Fluid and Coordinated No    Fine Motor Movements are Fluid and Coordinated No    Box and Blocks RUE 16 blocks, LUE 21 blocks      ROM / Strength   AROM / PROM / Strength AROM      AROM   Overall AROM  Deficits    Overall AROM Comments RUE shoulder flexion 120, elbow flexion/ extension WFLS, decreased supination, full composite finger flexion grossly 80% extension with clawing present(MP ext, with IP flexion present)LUE shoulder flexion 50, elbow flexion WFLs, extension-40, supination/ prnation WFLs      Hand Function   Right Hand Grip (lbs) 4.8 lbs    Left Hand Grip (lbs) 26.2                                OT Short Term Goals - 10/21/21 1347       OT SHORT TERM GOAL #1   Title I with initial HEP    Time 4    Period Weeks    Status New    Target Date 11/18/21      OT SHORT TERM GOAL #2   Title Pt will donn shirt  and bathe UB with min A consistently    Time 4    Period Weeks    Status New      OT SHORT TERM GOAL #3   Title Pt will be I with bilateral UE positioning including splinting  to minimize pain and risk for contracture and injury.    Time 4    Period Weeks    Status New      OT SHORT TERM GOAL #4   Title Pt will demonstrate 60* LUE shoulder flexion in prep for functional reach    Time 4    Period Weeks    Status New      OT SHORT TERM GOAL #5   Title Pt will demonstrate improved bilateral UE functional use as eveidenced by increasing bilateral box/ blocks score by 3 blocks.    Time 4    Period Weeks    Status New               OT Long Term Goals - 10/21/21 1350       OT LONG  TERM GOAL #1   Title Pt will perform UB dressing with setup and LB dressing with min A.    Time 12    Period Weeks    Status New      OT LONG TERM GOAL #2   Title Pt will bathe herself with min A.    Time 12    Period Weeks    Status New      OT LONG TERM GOAL #3   Title Pt will perform simple home management/ cooking tasks with min A.    Time 12    Period Weeks  Status New      OT LONG TERM GOAL #4   Title Pt will retrieve a lightweight object at 75* shoulder flexion with LUE.    Time 12    Period Weeks    Status New      OT LONG TERM GOAL #5   Title Pt will increase RUE grip strength to 15 lbs for increased functional use.    Time 12    Period Weeks    Status New      OT LONG TERM GOAL #6   Title Pt will demonstrate ability to write her name and address with 100% legibility using AE prn.    Time 12    Period Weeks    Status New      OT LONG TERM GOAL #7   Title Pt will utilize RUE at least 75% of the time for ADLS/ IADLS with pain no greater than 4/10.    Time 12    Period Weeks    Status New                   Plan - 10/21/21 1330     Clinical Impression Statement [name] is a 70 y.o. female with PMHx of obesity, DM, HTN, HLD, ESRD on HD who presented with left sided weakness for 3 days on 04/16/2021. Pt was diagnosed with R basal ganglia infarct Pt went to rehab but then transferred back to acute service on 7/13 for likely seizure. Pt returned to CIR and was d/c home on 05/27/21. Pt received HHOT and PT. Pt has R hand weakness s/p fistula placement. PMH: diabetes, hypertension, hyperlipidemia, ESRD on HD MWF. Pt presents with the following deficits:decreased strength, pain decreased coordination, decreased ROM, sensory deficits, decreased balance, decreased functional mobility.She can benefit from skilled occupational therapy to address these deficits in order to maximize pt's safety and I with ADLs/ IADLs.    OT Occupational Profile and History  Detailed Assessment- Review of Records and additional review of physical, cognitive, psychosocial history related to current functional performance    Occupational performance deficits (Please refer to evaluation for details): ADL's;IADL's;Play;Social Participation;Rest and Sleep    Body Structure / Function / Physical Skills ADL;Endurance;UE functional use;Balance;Flexibility;Pain;FMC;ROM;Gait;Coordination;GMC;Sensation;Decreased knowledge of precautions;Decreased knowledge of use of DME;IADL;Dexterity;Strength;Mobility    Rehab Potential Good    Clinical Decision Making Limited treatment options, no task modification necessary    Comorbidities Affecting Occupational Performance: May have comorbidities impacting occupational performance    Modification or Assistance to Complete Evaluation  Min-Moderate modification of tasks or assist with assess necessary to complete eval    OT Frequency 2x / week    OT Duration 12 weeks    OT Treatment/Interventions Self-care/ADL training;Ultrasound;DME and/or AE instruction;Patient/family education;Balance training;Passive range of motion;Paraffin;Cryotherapy;Fluidtherapy;Splinting;Functional Mobility Training;Electrical Stimulation;Moist Heat;Therapeutic exercise;Manual Therapy;Therapeutic activities;Neuromuscular education    Plan education regarding ADL strategies and simple functional use of RUE and address L shoulder pain, may need anti claw splint in the future.    Consulted and Agree with Plan of Care Patient;Other (Comment)   caregiver            Patient will benefit from skilled therapeutic intervention in order to improve the following deficits and impairments:   Body Structure / Function / Physical Skills: ADL, Endurance, UE functional use, Balance, Flexibility, Pain, FMC, ROM, Gait, Coordination, GMC, Sensation, Decreased knowledge of precautions, Decreased knowledge of use of DME, IADL, Dexterity, Strength, Mobility       Visit  Diagnosis: Other  lack of coordination - Plan: Ot plan of care cert/re-cert  Acute pain of left shoulder - Plan: Ot plan of care cert/re-cert  Pain in right hand - Plan: Ot plan of care cert/re-cert  Hemiplegia and hemiparesis following cerebral infarction affecting left non-dominant side ([place]) - Plan: Ot plan of care cert/re-cert  Other abnormalities of gait and mobility - Plan: Ot plan of care cert/re-cert  Other disturbances of skin sensation - Plan: Ot plan of care cert/re-cert    Problem List Patient Active Problem List   Diagnosis Date Noted   Left hemiparesis ([place]) 09/24/2021   Abnormality of gait 09/24/2021   Pain of right hand 08/06/2021   Steal syndrome of dialysis vascular access ([place]) 08/06/2021   Subclavian steal syndrome of right subclavian artery 06/25/2021   Right hand weakness 06/25/2021   Stroke-like symptoms 05/06/2021   Bandemia    Cerebrovascular accident (CVA) of right basal ganglia ([place]) 04/20/2021   External hemorrhoids    Basal ganglia infarction ([place]) 04/16/2021   ESRD on dialysis ([place]) 04/16/2021   Hypertensive urgency 03/10/2021   Hilar enlargement    Anemia of chronic disease    Chronic diastolic CHF (congestive heart failure) ([place]) 03/09/2021   CKD (chronic kidney disease), stage V ([place]) 03/08/2021   Diabetic retinopathy ([place]) 03/08/2021   Hyperglycemia due to type 2 diabetes mellitus ([place]) 03/08/2021   Pure hypercholesterolemia 03/08/2021   Anemia in chronic kidney disease 09/24/2020   Uncontrolled type 2 diabetes mellitus with hyperglycemia ([place]) 03/19/2019   Osteopenia 09/04/2018   Migraine 09/04/2018   Asthma 09/04/2018   Pseudophakia of both eyes 07/17/2018   Pseudophakia of left eye 07/03/2018   Open angle with borderline findings and high glaucoma risk in left eye 07/03/2018   Age-related nuclear cataract of right eye 07/03/2018   CAD (coronary artery disease), native coronary artery 04/27/2017   Abnormal nuclear stress test  04/04/2017   Dyspnea on exertion 03/09/2017   Appendicitis    Age-related hypermature cataract of both eyes 12/20/2016   Uterine leiomyoma 11/27/2012   Dyslipidemia (high LDL; low HDL) 11/25/2011   Obesity (BMI 30-39.9) 11/25/2011   Hypertensive disorder 11/21/2011   Type 2 diabetes mellitus ([place]) 11/21/2011   Abnormal EKG 11/21/2011    Carolyn Russo, OT 10/21/2021, 2:19 PM Carolyn Russo, OTR/L [phone] Phone: [phone] 2:19 PM 10/21/21  [facility] [address] [place], [name], [address] Phone: [phone]   Fax:  [phone]  Name: Carolyn Russo MRN: [id number] Date of Birth: 1951/04/29

## 2021-10-22 DIAGNOSIS — N2581 Secondary hyperparathyroidism of renal origin: Secondary | ICD-10-CM | POA: Diagnosis not present

## 2021-10-22 DIAGNOSIS — D631 Anemia in chronic kidney disease: Secondary | ICD-10-CM | POA: Diagnosis not present

## 2021-10-22 DIAGNOSIS — N186 End stage renal disease: Secondary | ICD-10-CM | POA: Diagnosis not present

## 2021-10-22 DIAGNOSIS — Z992 Dependence on renal dialysis: Secondary | ICD-10-CM | POA: Diagnosis not present

## 2021-10-22 NOTE — Therapy (Signed)
[facility] [address] [place], [name], [address] Phone: [phone]   Fax:  [phone]  Physical Therapy Evaluation  Patient Details  Name: Carolyn Russo MRN: [id number] Date of Birth: 1951-05-12 Referring Provider (PT): Courtney Heys, MD   Encounter Date: 10/21/2021   PT End of Session - 10/22/21 1522     Visit Number 1    Number of Visits 25    Date for PT Re-Evaluation 01/14/22    Authorization Type Medicare    Authorization Time Period 10-21-21 - 01-14-22    PT Start Time 1106    PT Stop Time 1140   session ended 5" early due to c/o fatigue   PT Time Calculation (min) 34 min    Equipment Utilized During Treatment Gait belt    Activity Tolerance Patient limited by fatigue    Behavior During Therapy [facility] for tasks assessed/performed             Past Medical History:  Diagnosis Date   Acute GI bleeding    Allergy    Anemia    Anterior chest wall pain    Appendicitis 1965   Asthma    Body mass index 37.0-37.9, adult    Breast pain    Cataract    both eyes   CHF (congestive heart failure) ([place])    Chronic kidney disease    stage 5 - on dialysis   Cognitive change 04/20/2021   r/t cva 03/2821   Dehydration 2014   Deviated septum 1971   Diabetes mellitus    no meds   Dyspnea 2014   Extrinsic asthma    WITH ASTHMA ATTACK   Fibroid 1980   GERD (gastroesophageal reflux disease)    Heart murmur    Hx gestational diabetes    Hyperlipidemia    Hypertension 2014   Inguinal hernia 1959   Malaise and fatigue 2014   Non-IgE mediated allergic asthma 2014   Obesity    Pelvic pain    Pregnancy, high-risk 1985   Stroke ([place]) 04/20/2021   (CVA) of right basal ganglia   Tonsillitis 1968   Uterine fibroid 1980    Past Surgical History:  Procedure Laterality Date   APPENDECTOMY  7741   BASCILIC VEIN TRANSPOSITION Right 04/30/2021   Procedure: RIGHT FIRST STAGE [place];   Surgeon: Angelia Mould, MD;  Location: [facility] OR;  Service: Vascular;  Laterality: Right;   CESAREAN SECTION  1985   COLONOSCOPY     ESOPHAGOGASTRODUODENOSCOPY (EGD) WITH PROPOFOL N/A 04/18/2021   Procedure: ESOPHAGOGASTRODUODENOSCOPY (EGD) WITH PROPOFOL;  Surgeon: Gatha Mayer, MD;  Location: [facility] ENDOSCOPY;  Service: Endoscopy;  Laterality: N/A;   EYE SURGERY     bilateral cataract    FLEXIBLE SIGMOIDOSCOPY N/A 04/18/2021   Procedure: FLEXIBLE SIGMOIDOSCOPY;  Surgeon: Gatha Mayer, MD;  Location: [facility] ENDOSCOPY;  Service: Endoscopy;  Laterality: N/A;   HERNIA REPAIR  1959   IR FLUORO GUIDE CV LINE RIGHT  03/18/2021   IR US GUIDE VASC ACCESS RIGHT  03/18/2021   LEFT HEART CATH AND CORONARY ANGIOGRAPHY N/A 04/04/2017   Procedure: Left Heart Cath and Coronary Angiography;  Surgeon: Belva Crome, MD;  Location: [facility] CV LAB;  Service: Cardiovascular;  Laterality: N/A;   MYOMECTOMY  1980, 2004, 2007   RHINOPLASTY  1971   ROTATOR CUFF REPAIR  2003   SURGICAL REPAIR OF HEMORRHAGE  2015   TONSILLECTOMY  1968    There were no vitals  filed for this visit.    Subjective Assessment - 10/21/21 1111     Subjective Pt presents to OP PT with Lt sided weakness due to Rt basal ganglia CVA on 04-16-21; pt has clawing on Rt hand due to steal syndrome of dialysis vascular access.   Pt was transferred to inpatient rehab on 04-20-21 and was discharged home on 05-27-21. Pt is using a RW and is accompanied by her CNA.  She is currently using a RW for assistance with ambulation.    Patient is accompained by: --   CNA, Ms. Sherrod   Pertinent History ESRD with HD M-W-Fri:, obesity, DM type 2, CAD, iron deficiency anemia, bronchial asthma, chronic diastolic CHF, Rt basal ganglia CVA on 04-16-21, steal syndrome RUE due to dialysis vascular access (s/p fistula placement), CKD stage V:  Rt hand weakness; Lt hemiparesis due to CVA    Patient Stated Goals "be independent with mobility and self care"; "be able to  drive again"    Currently in Pain? Yes    Pain Score 6     Pain Location Hand    Pain Orientation Right    Pain Descriptors / Indicators Tingling;Discomfort;Pins and needles;Shooting    Pain Type Neuropathic pain    Pain Onset More than a month ago    Pain Frequency Intermittent    Aggravating Factors  pushing down on it to use it and "pulling on it"    Pain Relieving Factors sleep makes it better    Pain Score 8    Pain Location Shoulder    Pain Orientation Left    Pain Descriptors / Indicators Aching    Pain Type Acute pain    Pain Onset More than a month ago    Pain Frequency Intermittent    Aggravating Factors  lifting arm    Pain Relieving Factors rest, not moving it            Pain not addressed in PT as will be addressed in OT; PT will monitor pain during sessions and revise prn    [facility] PT Assessment - 10/22/21 0001       Assessment   Medical Diagnosis Rt basal ganglia CVA, steal syndrome RUE s/p attempted fistual placement    Referring Provider (PT) Courtney Heys, MD    Onset Date/Surgical Date 04/16/21    Hand Dominance Right      Precautions   Precautions Fall    Precaution Comments No driving, portacath      Balance Screen   Has the patient fallen in the past 6 months No    Has the patient had a decrease in activity level because of a fear of falling?  No    Is the patient reluctant to leave their home because of a fear of falling?  No      Home Environment   Living Environment Private residence    Living Arrangements Spouse/significant other    Type of [place] One level    Additional Comments pt has CNA that assists with ADL's      Prior Function   Level of Independence Needs assistance with ADLs;Needs assistance with gait;Needs assistance with transfers      Observation/Other Assessments   Focus on Therapeutic Outcomes (FOTO)  N/A as CVA > 6 months ago      ROM / Strength   AROM / PROM /  Strength Strength;AROM  AROM   Overall AROM  Deficits    Overall AROM Comments Decreased Lt shoulder flexion; bil. LE AROM is WFL's    AROM Assessment Site Hip;Knee;Ankle      Strength   Overall Strength Deficits    Overall Strength Comments RLE is WFL's    Strength Assessment Site Hip;Knee    Right/Left Hip Left    Left Hip Flexion 3+/5    Right/Left Knee Left    Left Knee Flexion 4/5    Left Knee Extension 4/5      Ambulation/Gait   Ambulation/Gait Yes    Ambulation/Gait Assistance 5: Supervision    Ambulation Distance (Feet) 60 Feet    Assistive device Rolling walker    Gait Pattern Step-through pattern;Decreased hip/knee flexion - left;Decreased step length - left    Ambulation Surface Level;Indoor    Gait velocity Not assessed due to pt's c/o fatigue      Standardized Balance Assessment   Standardized Balance Assessment Berg Balance Test;Timed Up and Go Test      Berg Balance Test   Sit to Stand Needs minimal aid to stand or to stabilize    Standing Unsupported Unable to stand 30 seconds unassisted    Sitting with Back Unsupported but Feet Supported on Floor or Stool Able to sit safely and securely 2 minutes    Stand to Sit Controls descent by using hands    Transfers Needs one person to assist    Standing Unsupported with Eyes Closed Unable to keep eyes closed 3 seconds but stays steady    Standing Unsupported with Feet Together Needs help to attain position and unable to hold for 15 seconds    From Standing, Reach Forward with Outstretched Arm Reaches forward but needs supervision    From Standing Position, Pick up Object from Floor Unable to try/needs assist to keep balance    From Standing Position, Turn to Look Behind Over each Shoulder Turn sideways only but maintains balance    Turn 360 Degrees Needs assistance while turning    Standing Unsupported, Alternately Place Feet on Step/Stool Needs assistance to keep from falling or unable to try    Standing  Unsupported, One Foot in [employer] balance while stepping or standing    Standing on One Leg Unable to try or needs assist to prevent fall    Total Score 13      Timed Up and Go Test   TUG Normal TUG    Normal TUG (seconds) 32.6   with RW                       Objective measurements completed on examination: See above findings.                PT Education - 10/22/21 1521     Education Details eval results discussed with pt and her CNA    Person(s) Educated Patient;Caregiver(s)    Methods Explanation    Comprehension Verbalized understanding              PT Short Term Goals - 10/22/21 1540       PT SHORT TERM GOAL #1   Title Improve TUG score from 32 secs to </= 27 secs with RW with SBA to demo improved functional mobility.    Baseline 32 secs    Time 4    Period Weeks    Status New    Target Date 11/19/21      PT SHORT  TERM GOAL #2   Title Pt will perform 5 times sit to stand transfers from standard chair with only 1 UE support with SBA to demo increased LE strength and balance.    Baseline Bil. UE support with CGA to min asssit needed for sit to stand - 10-21-21    Time 4    Period Weeks    Status New    Target Date 11/19/21      PT SHORT TERM GOAL #3   Title Increase Berg balance test score from 13/30 to >/= 19/30 to demo improved balance.    Baseline 13/30    Time 4    Period Weeks    Status New    Target Date 11/19/21      PT SHORT TERM GOAL #4   Title Increase gait velocity by at least .4 ft/sec with RW to demo improved gait efficiency.    Time 4    Period Weeks    Status New    Target Date 11/19/21      PT SHORT TERM GOAL #5   Title Amb. 15' with RW with SBA on flat, even surface for increased community accessibility.    Baseline 77' with RW due to fatigue    Time 4    Period Weeks    Status New    Target Date 11/19/21      Additional Short Term Goals   Additional Short Term Goals Yes      PT SHORT TERM GOAL #6    Title Independent with HEP for strengthening & balance exs.    Time 4    Period Weeks    Status New    Target Date 11/19/21               PT Long Term Goals - 10/22/21 1545       PT LONG TERM GOAL #1   Title Improve TUG score to </= 20 secs with RW with supervision to reduce fall risk.    Baseline 32 secs with RW    Time 12    Period Weeks    Status New    Target Date 01/14/22      PT LONG TERM GOAL #2   Title Pt will amb. 500' with RW with SBA on flat, even surface for increased community accessibility.    Baseline 31' with RW    Time 12    Period Weeks    Status New    Target Date 01/14/22      PT LONG TERM GOAL #3   Title Increase Berg balance test score from 13/56 to >/= 28/56 to reduce fall risk.    Time 12    Period Weeks    Status New    Target Date 01/14/22      PT LONG TERM GOAL #4   Title Increase gait velocity by at least 1.0 ft/sec with RW for incr. gait efficiency.    Time 12    Period Weeks    Status New    Target Date 01/14/22      PT LONG TERM GOAL #5   Title Perform sit to stand from chair without UE support to demo improved LE strength and balance.    Time 12    Period Weeks    Status New    Target Date 01/14/22      Additional Long Term Goals   Additional Long Term Goals Yes      PT LONG TERM GOAL #6  Title Negotiate 4 steps with 1 hand rail using step by step sequence with SBA.    Time 12    Period Weeks    Status New    Target Date 01/14/22      PT LONG TERM GOAL #7   Title Independent in updated HEP for balance & strengthening.    Time 12    Period Weeks    Status New    Target Date 01/14/22                    Plan - 10/22/21 1524     Clinical Impression Statement Pt is a 70 yr old lady s/p Rt basal ganglia CVA on 04-16-21; pt presented to [facility] ED after having weakness for 3 days. Pt was admitted to [facility] hospital on 04-16-21 and transferred to inpatient rehab on 04-20-21 until 05-05-21 at which time she was  transferred back to acute due to having stroke like symptoms.  CT scan was negative and pt was readmitted to rehab again on 05-07-21 until 05-27-21 when she was discharged home.  Pt received home health PT and OT.  Pt has pain and weakness in her RUE due to steal syndrome of dialysis vascular access.  Pt was hospitalized on 03-09-21 until 03-24-21 for renal failure and was started on dialysis at that time.  Pt presents with significant standing balance deficits with Berg balance test score 13/56 and TUG score 32 secs.  Pt presents with dependencies with transfers (incorrect hand placement with sit to/from stand transfers) and also with gait as pt is using a RW for assistance with ambulation.  Pt also demonstrates decreased endurance and was able to stand only approx. 1" prior to fatigue.  Pt has decreased Lt shoulder AROM due to pain with AROM and decreased RUE functional use due to pain and clawing of hand due to steal syndrome.  Pt will benefit from PT to address endurance, balance and gait deficits and weakness in her LLE.    Personal Factors and Comorbidities Fitness;Comorbidity 2;Behavior Pattern    Comorbidities CAD, ESRD on HD M-W-Fri, CKD stage V, iron deficiency anemia, DM type 2, bronchial asthma, chronic diastolic CHF, s/p Rt basal ganglia CVA, steal syndrome RUE with fistula implant    Examination-Activity Limitations Bathing;Bed Mobility;Locomotion Level;Transfers;Reach Overhead;Squat;Stairs;Stand;Toileting;Hygiene/Grooming;Bend;Carry    Examination-Participation Restrictions Cleaning;Community Activity;Driving;Interpersonal Relationship;Laundry;Meal Prep;Shop    Stability/Clinical Decision Making Evolving/Moderate complexity    Clinical Decision Making Moderate    Rehab Potential Good    PT Frequency 2x / week    PT Duration 12 weeks    PT Treatment/Interventions ADLs/Self Care Home Management;DME Instruction;Gait training;Stair training;Therapeutic activities;Therapeutic exercise;Balance  training;Neuromuscular re-education;Patient/family education    PT Next Visit Plan assess gait velocity; begin HEP for functional strengthening and standing balance; do 3" walk test    Recommended Other Services pt is also receiving OT    Consulted and Agree with Plan of Care Patient;Other (Comment)   CNA - Ms. Sherrod            Patient will benefit from skilled therapeutic intervention in order to improve the following deficits and impairments:  Abnormal gait, Decreased activity tolerance, Decreased balance, Decreased strength, Decreased endurance, Decreased range of motion, Impaired sensation, Impaired UE functional use  Visit Diagnosis: Hemiplegia and hemiparesis following cerebral infarction affecting left non-dominant side (HCC) - Plan: PT plan of care cert/re-cert  Other abnormalities of gait and mobility - Plan: PT plan of care cert/re-cert  Muscle weakness (generalized) - Plan: PT  plan of care cert/re-cert  Unsteadiness on feet - Plan: PT plan of care cert/re-cert     Problem List Patient Active Problem List   Diagnosis Date Noted   Left hemiparesis ([place]) 09/24/2021   Abnormality of gait 09/24/2021   Pain of right hand 08/06/2021   Steal syndrome of dialysis vascular access ([place]) 08/06/2021   Subclavian steal syndrome of right subclavian artery 06/25/2021   Right hand weakness 06/25/2021   Stroke-like symptoms 05/06/2021   Bandemia    Cerebrovascular accident (CVA) of right basal ganglia ([place]) 04/20/2021   External hemorrhoids    Basal ganglia infarction ([place]) 04/16/2021   ESRD on dialysis ([place]) 04/16/2021   Hypertensive urgency 03/10/2021   Hilar enlargement    Anemia of chronic disease    Chronic diastolic CHF (congestive heart failure) ([place]) 03/09/2021   CKD (chronic kidney disease), stage V ([place]) 03/08/2021   Diabetic retinopathy ([place]) 03/08/2021   Hyperglycemia due to type 2 diabetes mellitus ([place]) 03/08/2021   Pure hypercholesterolemia 03/08/2021    Anemia in chronic kidney disease 09/24/2020   Uncontrolled type 2 diabetes mellitus with hyperglycemia ([place]) 03/19/2019   Osteopenia 09/04/2018   Migraine 09/04/2018   Asthma 09/04/2018   Pseudophakia of both eyes 07/17/2018   Pseudophakia of left eye 07/03/2018   Open angle with borderline findings and high glaucoma risk in left eye 07/03/2018   Age-related nuclear cataract of right eye 07/03/2018   CAD (coronary artery disease), native coronary artery 04/27/2017   Abnormal nuclear stress test 04/04/2017   Dyspnea on exertion 03/09/2017   Appendicitis    Age-related hypermature cataract of both eyes 12/20/2016   Uterine leiomyoma 11/27/2012   Dyslipidemia (high LDL; low HDL) 11/25/2011   Obesity (BMI 30-39.9) 11/25/2011   Hypertensive disorder 11/21/2011   Type 2 diabetes mellitus ([place]) 11/21/2011   Abnormal EKG 11/21/2011    Jamy Cleckler, [place], PT 10/22/2021, 3:56 PM  [facility] [address] [place], [name], [address] Phone: [phone]   Fax:  [phone]  Name: NAUDIA CROSLEY MRN: [id number] Date of Birth: [birth year]

## 2021-10-26 ENCOUNTER — Encounter (HOSPITAL_COMMUNITY): Payer: Self-pay

## 2021-10-28 ENCOUNTER — Other Ambulatory Visit: Payer: Self-pay

## 2021-10-28 ENCOUNTER — Telehealth (INDEPENDENT_AMBULATORY_CARE_PROVIDER_SITE_OTHER): Payer: Medicare PPO | Admitting: Internal Medicine

## 2021-10-28 ENCOUNTER — Encounter: Payer: Self-pay | Admitting: Internal Medicine

## 2021-10-28 ENCOUNTER — Encounter (HOSPITAL_COMMUNITY): Payer: Self-pay

## 2021-10-28 DIAGNOSIS — L03818 Cellulitis of other sites: Secondary | ICD-10-CM | POA: Diagnosis not present

## 2021-10-28 DIAGNOSIS — J0100 Acute maxillary sinusitis, unspecified: Secondary | ICD-10-CM

## 2021-10-28 MED ORDER — CEPHALEXIN 250 MG PO CAPS: 250.0000 mg | ORAL_CAPSULE | Freq: Two times a day (BID) | ORAL | 0 refills | Status: DC

## 2021-10-28 NOTE — Progress Notes (Signed)
Virtual Visit via Video   This visit type was conducted due to national recommendations for restrictions regarding the COVID-19 Pandemic (e.g. social distancing) in an effort to limit this patient's exposure and mitigate transmission in our community.  Due to her co-morbid illnesses, this patient is at least at moderate risk for complications without adequate follow up.  This format is felt to be most appropriate for this patient at this time.  All issues noted in this document were discussed and addressed.  A limited physical exam was performed with this format.    This visit type was conducted due to national recommendations for restrictions regarding the COVID-19 Pandemic (e.g. social distancing) in an effort to limit this patient's exposure and mitigate transmission in our community.  Patients identity confirmed using two different identifiers.  This format is felt to be most appropriate for this patient at this time.  All issues noted in this document were discussed and addressed.  No physical exam was performed (except for noted visual exam findings with Video Visits).    Date:  10/30/2021   ID:  Carolyn Russo, DOB Jan 09, 1951, MRN [id number]  Patient Location:  Home, accompanied by [place] Sherrod, home health nurse  Provider location:   Office  Chief Complaint:  "I have right nasal congestion"  History of Present Illness:    Carolyn Russo is a 71 y.o. female who presents via video conferencing for a telehealth visit today.    The patient does not have symptoms concerning for COVID-19 infection (fever, chills, cough, or new shortness of breath).   Patient presents today for sinus congestion. She states that her symptoms started about a week ago and continue to worsen. She states that she could not go to dialysis on Monday due to extreme fatigue. Also reports having body aches on Monday. She denies ill contacts, but admits to having a fever two weeks ago. Also with right-sided headache,  facial pain. States home rapid test neg for COVID.     Past Medical History:  Diagnosis Date   Acute GI bleeding    Allergy    Anemia    Anterior chest wall pain    Appendicitis 1965   Asthma    Body mass index 37.0-37.9, adult    Breast pain    Cataract    both eyes   CHF (congestive heart failure) ([place])    Chronic kidney disease    stage 5 - on dialysis   Cognitive change 04/20/2021   r/t cva 03/2821   Dehydration 2014   Deviated septum 1971   Diabetes mellitus    no meds   Dyspnea 2014   Extrinsic asthma    WITH ASTHMA ATTACK   Fibroid 1980   GERD (gastroesophageal reflux disease)    Heart murmur    Hx gestational diabetes    Hyperlipidemia    Hypertension 2014   Inguinal hernia 1959   Malaise and fatigue 2014   Non-IgE mediated allergic asthma 2014   Obesity    Pelvic pain    Pregnancy, high-risk 1985   Stroke ([place]) 04/20/2021   (CVA) of right basal ganglia   Tonsillitis 1968   Uterine fibroid 1980   Past Surgical History:  Procedure Laterality Date   APPENDECTOMY  9935   BASCILIC VEIN TRANSPOSITION Right 04/30/2021   Procedure: RIGHT FIRST STAGE [place];  Surgeon: Angelia Mould, MD;  Location: [facility];  Service: Vascular;  Laterality: Right;   [place]  COLONOSCOPY     ESOPHAGOGASTRODUODENOSCOPY (EGD) WITH PROPOFOL N/A 04/18/2021   Procedure: ESOPHAGOGASTRODUODENOSCOPY (EGD) WITH PROPOFOL;  Surgeon: Gatha Mayer, MD;  Location: [facility];  Service: Endoscopy;  Laterality: N/A;   EYE SURGERY     bilateral cataract    FLEXIBLE SIGMOIDOSCOPY N/A 04/18/2021   Procedure: FLEXIBLE SIGMOIDOSCOPY;  Surgeon: Gatha Mayer, MD;  Location: [facility] ENDOSCOPY;  Service: Endoscopy;  Laterality: N/A;   HERNIA REPAIR  1959   IR FLUORO GUIDE CV LINE RIGHT  03/18/2021   IR US GUIDE VASC ACCESS RIGHT  03/18/2021   LEFT HEART CATH AND CORONARY ANGIOGRAPHY N/A 04/04/2017   Procedure: Left Heart Cath and Coronary Angiography;  Surgeon:  Belva Crome, MD;  Location: [facility] CV LAB;  Service: Cardiovascular;  Laterality: N/A;   MYOMECTOMY  1980, 2004, 2007   RHINOPLASTY  1971   ROTATOR CUFF REPAIR  2003   SURGICAL REPAIR OF HEMORRHAGE  2015   TONSILLECTOMY  1968     Current Meds  Medication Sig   cephALEXin (KEFLEX) 250 MG capsule Take 1 capsule (250 mg total) by mouth every 12 (twelve) hours. Be sure to dose after dialysis on dialysis days     Allergies:   Food, Other, Wheat bran, Statins, Iodine, Shellfish allergy, Sitagliptin, Tetracycline, Tetracycline hcl, Tetracyclines & related, and Contrast media [iodinated contrast media]   Social History   Tobacco Use   Smoking status: Never   Smokeless tobacco: Never  Vaping Use   Vaping Use: Never used  Substance Use Topics   Alcohol use: No   Drug use: No     Family Hx: The patient's family history includes Alzheimer's disease in her father; Cancer in her cousin and mother; Diabetes in her cousin and maternal aunt; Psoriasis in her mother. There is no history of Colon cancer, Colon polyps, Esophageal cancer, Rectal cancer, Stomach cancer, or Breast cancer.  ROS:   Please see the history of present illness.    Review of Systems  Constitutional:  Positive for malaise/fatigue.  HENT:  Positive for congestion.   Respiratory: Negative.    Cardiovascular: Negative.   Gastrointestinal: Negative.   Neurological:  Positive for headaches.  Psychiatric/Behavioral: Negative.     All other systems reviewed and are negative.   Labs/Other Tests and Data Reviewed:    Recent Labs: 03/09/2021: B Natriuretic Peptide 818.7 03/11/2021: Magnesium 2.0 04/14/2021: ALT 21 05/26/2021: BUN 22; Creatinine, Ser 5.29; Hemoglobin 10.8; Platelets 348; Potassium 4.8; Sodium 129   Recent Lipid Panel Lab Results  Component Value Date/Time   CHOL 135 04/16/2021 06:40 PM   CHOL 158 02/17/2021 11:46 AM   TRIG 89 04/16/2021 06:40 PM   HDL 47 04/16/2021 06:40 PM   HDL 40 02/17/2021  11:46 AM   CHOLHDL 2.9 04/16/2021 06:40 PM   LDLCALC 70 04/16/2021 06:40 PM   LDLCALC 99 02/17/2021 11:46 AM    Wt Readings from Last 3 Encounters:  09/27/21 197 lb (89.4 kg)  09/24/21 192 lb 9.6 oz (87.4 kg)  09/02/21 190 lb (86.2 kg)     Exam:    Vital Signs:  There were no vitals taken for this visit.    Physical Exam Vitals and nursing note reviewed.  HENT:     Head: Normocephalic and atraumatic.     Comments: There is some facial asymmetry    Nose:     Comments: Tip of nose is erythematous/swelling. Redness extends to right nostril Pulmonary:     Effort: Pulmonary effort is normal.  Musculoskeletal:     Cervical back: Normal range of motion.  Neurological:     Mental Status: She is alert and oriented to person, place, and time.  Psychiatric:        Mood and Affect: Affect normal.    ASSESSMENT & PLAN:    1. Acute non-recurrent maxillary sinusitis Comments: She was given rx cephalexin, renal dosing for ESRD pts. She is encouraged to take full course of abx.   2. Cellulitis of other specified site Comments: Pt advised that the cephalexin should cover this infection as well.  She is encouraged to complete full abx course, contact ofc next week w/ update re: sx.     COVID-19 Education: The signs and symptoms of COVID-19 were discussed with the patient and how to seek care for testing (follow up with PCP or arrange E-visit).  The importance of social distancing was discussed today.  Patient Risk:   After full review of this patients clinical status, I feel that they are at least moderate risk at this time.  Time:   Today, I have spent 12 minutes/ seconds with the patient with telehealth technology discussing above diagnoses.     Medication Adjustments/Labs and Tests Ordered: Current medicines are reviewed at length with the patient today.  Concerns regarding medicines are outlined above.   Tests Ordered: No orders of the defined types were placed in this  encounter.   Medication Changes: Meds ordered this encounter  Medications   cephALEXin (KEFLEX) 250 MG capsule    Sig: Take 1 capsule (250 mg total) by mouth every 12 (twelve) hours. Be sure to dose after dialysis on dialysis days    Dispense:  14 capsule    Refill:  0    Disposition:  Follow up prn  Signed, Maximino Greenland, MD

## 2021-11-01 ENCOUNTER — Other Ambulatory Visit: Payer: Self-pay

## 2021-11-01 MED ORDER — CARVEDILOL 3.125 MG PO TABS: 3.1250 mg | ORAL_TABLET | Freq: Two times a day (BID) | ORAL | 1 refills | Status: DC

## 2021-11-02 ENCOUNTER — Ambulatory Visit: Payer: Medicare PPO | Attending: Internal Medicine | Admitting: Physical Therapy

## 2021-11-02 ENCOUNTER — Encounter: Payer: Self-pay | Admitting: Physical Therapy

## 2021-11-02 ENCOUNTER — Encounter: Payer: Self-pay | Admitting: Occupational Therapy

## 2021-11-02 ENCOUNTER — Ambulatory Visit: Payer: Medicare PPO | Admitting: Occupational Therapy

## 2021-11-02 ENCOUNTER — Other Ambulatory Visit: Payer: Self-pay

## 2021-11-02 DIAGNOSIS — R2681 Unsteadiness on feet: Secondary | ICD-10-CM

## 2021-11-02 DIAGNOSIS — R208 Other disturbances of skin sensation: Secondary | ICD-10-CM | POA: Diagnosis present

## 2021-11-02 DIAGNOSIS — M79641 Pain in right hand: Secondary | ICD-10-CM | POA: Insufficient documentation

## 2021-11-02 DIAGNOSIS — R278 Other lack of coordination: Secondary | ICD-10-CM | POA: Insufficient documentation

## 2021-11-02 DIAGNOSIS — M25512 Pain in left shoulder: Secondary | ICD-10-CM | POA: Diagnosis present

## 2021-11-02 DIAGNOSIS — R2689 Other abnormalities of gait and mobility: Secondary | ICD-10-CM | POA: Insufficient documentation

## 2021-11-02 DIAGNOSIS — M6281 Muscle weakness (generalized): Secondary | ICD-10-CM | POA: Diagnosis present

## 2021-11-02 DIAGNOSIS — I69354 Hemiplegia and hemiparesis following cerebral infarction affecting left non-dominant side: Secondary | ICD-10-CM

## 2021-11-02 NOTE — Patient Instructions (Signed)
Access Code: [email or web address] URL: [email or web address] Date: 11/02/2021 Prepared by: Willow Ora  Exercises Sit to Stand with Counter Support - 1 x daily - 5 x weekly - 1 sets - 5-10 reps Seated Knee Extension with Resistance - 1 x daily - 5 x weekly - 1 sets - 10 reps Seated Hip Abduction with Resistance - 1 x daily - 5 x weekly - 1 sets - 10 reps Heel Raises with Counter Support - 1 x daily - 5 x weekly - 1 sets - 10 reps Standing March with Counter Support - 1 x daily - 5 x weekly - 1 sets - 8-10 reps

## 2021-11-02 NOTE — Therapy (Signed)
[facility] [address] [place], [name], [address] Phone: [phone]   Fax:  [phone]  Physical Therapy Treatment  Patient Details  Name: Carolyn Russo MRN: [id number] Date of Birth: 30-Oct-1950 Referring Provider (PT): Courtney Heys, MD   Encounter Date: 11/02/2021   PT End of Session - 11/02/21 1025     Visit Number 2    Number of Visits 25    Date for PT Re-Evaluation 01/14/22    Authorization Type Medicare    Authorization Time Period 10-21-21 - 01-14-22    PT Start Time 1018    PT Stop Time 1100    PT Time Calculation (min) 42 min    Equipment Utilized During Treatment Gait belt    Activity Tolerance Patient limited by fatigue    Behavior During Therapy [facility] for tasks assessed/performed             Past Medical History:  Diagnosis Date   Acute GI bleeding    Allergy    Anemia    Anterior chest wall pain    Appendicitis 1965   Asthma    Body mass index 37.0-37.9, adult    Breast pain    Cataract    both eyes   CHF (congestive heart failure) ([place])    Chronic kidney disease    stage 5 - on dialysis   Cognitive change 04/20/2021   r/t cva 03/2821   Dehydration 2014   Deviated septum 1971   Diabetes mellitus    no meds   Dyspnea 2014   Extrinsic asthma    WITH ASTHMA ATTACK   Fibroid 1980   GERD (gastroesophageal reflux disease)    Heart murmur    Hx gestational diabetes    Hyperlipidemia    Hypertension 2014   Inguinal hernia 1959   Malaise and fatigue 2014   Non-IgE mediated allergic asthma 2014   Obesity    Pelvic pain    Pregnancy, high-risk 1985   Stroke ([place]) 04/20/2021   (CVA) of right basal ganglia   Tonsillitis 1968   Uterine fibroid 1980    Past Surgical History:  Procedure Laterality Date   APPENDECTOMY  1448   BASCILIC VEIN TRANSPOSITION Right 04/30/2021   Procedure: RIGHT FIRST STAGE [place];  Surgeon: Angelia Mould, MD;  Location: [facility]  OR;  Service: Vascular;  Laterality: Right;   CESAREAN SECTION  1985   COLONOSCOPY     ESOPHAGOGASTRODUODENOSCOPY (EGD) WITH PROPOFOL N/A 04/18/2021   Procedure: ESOPHAGOGASTRODUODENOSCOPY (EGD) WITH PROPOFOL;  Surgeon: Gatha Mayer, MD;  Location: [facility] ENDOSCOPY;  Service: Endoscopy;  Laterality: N/A;   EYE SURGERY     bilateral cataract    FLEXIBLE SIGMOIDOSCOPY N/A 04/18/2021   Procedure: FLEXIBLE SIGMOIDOSCOPY;  Surgeon: Gatha Mayer, MD;  Location: [facility] ENDOSCOPY;  Service: Endoscopy;  Laterality: N/A;   HERNIA REPAIR  1959   IR FLUORO GUIDE CV LINE RIGHT  03/18/2021   IR US GUIDE VASC ACCESS RIGHT  03/18/2021   LEFT HEART CATH AND CORONARY ANGIOGRAPHY N/A 04/04/2017   Procedure: Left Heart Cath and Coronary Angiography;  Surgeon: Belva Crome, MD;  Location: [facility] CV LAB;  Service: Cardiovascular;  Laterality: N/A;   MYOMECTOMY  1980, 2004, 2007   RHINOPLASTY  1971   ROTATOR CUFF REPAIR  2003   SURGICAL REPAIR OF HEMORRHAGE  2015   TONSILLECTOMY  1968    There were no vitals filed for this visit.   Subjective Assessment -  11/02/21 1023     Subjective No new complaints. No falls. Does have sinus pain/pressure, on an anitbiotic for sinus infection.    Patient is accompained by: --   caregiver, Ms. Sherrod   Pertinent History ESRD with HD M-W-Fri:, obesity, DM type 2, CAD, iron deficiency anemia, bronchial asthma, chronic diastolic CHF, Rt basal ganglia CVA on 04-16-21, steal syndrome RUE due to dialysis vascular access (s/p fistula placement), CKD stage V:  Rt hand weakness; Lt hemiparesis due to CVA    Patient Stated Goals "be independent with mobility and self care"; "be able to drive again"    Currently in Pain? Yes    Pain Score 6    when placing pressure on it   Pain Location Hand    Pain Orientation Right    Pain Descriptors / Indicators Tender;Tingling    Pain Type Neuropathic pain    Pain Onset More than a month ago    Pain Frequency Intermittent    Aggravating  Factors  pressure on hand    Pain Relieving Factors rest                     [facility] Adult PT Treatment/Exercise - 11/02/21 1025       Transfers   Transfers Sit to Stand;Stand to Sit    Sit to Stand 5: Supervision;With upper extremity assist;From bed;From chair/3-in-1    Stand to Sit 5: Supervision;With upper extremity assist;To bed;To chair/3-in-1      Ambulation/Gait   Ambulation/Gait Yes    Ambulation/Gait Assistance 5: Supervision    Ambulation/Gait Assistance Details cues for posture and to stay within walker    Ambulation Distance (Feet) 125 Feet   x1, 45  x2 reps   Assistive device Rolling walker    Gait Pattern Step-through pattern;Decreased hip/knee flexion - left;Decreased step length - left    Ambulation Surface Level;Indoor    Gait velocity 28 sec's= 1.17 ft/sec with RW      Exercises   Exercises Other Exercises    Other Exercises  issued HEP for strengthening. Refer to [place] program for full details. cues on ex form and technique.             Issued to HEP this session: Access Code: [email or web address] URL: [email or web address] Date: 11/02/2021 Prepared by: Willow Ora  Exercises Sit to Stand with Counter Support - 1 x daily - 5 x weekly - 1 sets - 5-10 reps Seated Knee Extension with Resistance - 1 x daily - 5 x weekly - 1 sets - 10 reps Seated Hip Abduction with Resistance - 1 x daily - 5 x weekly - 1 sets - 10 reps Heel Raises with Counter Support - 1 x daily - 5 x weekly - 1 sets - 10 reps Standing March with Counter Support - 1 x daily - 5 x weekly - 1 sets - 8-10 reps        PT Education - 11/02/21 1147     Education Details initial HEP for strengthening    Person(s) Educated Patient    Methods Explanation;Demonstration;Verbal cues;Handout    Comprehension Verbalized understanding;Returned demonstration;Verbal cues required;Need further instruction              PT Short Term Goals - 11/02/21 1156       PT SHORT TERM  GOAL #1   Title Improve TUG score from 32 secs to </= 27 secs with RW with SBA to demo improved functional mobility. (all STGs due 11/19/21)  Baseline 32 secs    Time 4    Period Weeks    Status On-going    Target Date 11/19/21      PT SHORT TERM GOAL #2   Title Pt will perform 5 times sit to stand transfers from standard chair with only 1 UE support with SBA to demo increased LE strength and balance.    Baseline Bil. UE support with CGA to min asssit needed for sit to stand - 10-21-21    Time 4    Period Weeks    Status On-going    Target Date 11/19/21      PT SHORT TERM GOAL #3   Title Increase Berg balance test score from 13/30 to >/= 19/30 to demo improved balance.    Baseline 13/30    Time 4    Period Weeks    Status On-going    Target Date 11/19/21      PT SHORT TERM GOAL #4   Title Increase gait velocity by at least .4 ft/sec with RW to demo improved gait efficiency.    Baseline 11/02/21: 1.17 ft/sec with RW as baselne    Time 4    Period Weeks    Status On-going    Target Date 11/19/21      PT SHORT TERM GOAL #5   Title Amb. 64' with RW with SBA on flat, even surface for increased community accessibility.    Baseline 67' with RW due to fatigue    Time 4    Period Weeks    Status On-going    Target Date 11/19/21      PT SHORT TERM GOAL #6   Title Independent with HEP for strengthening & balance exs.    Time 4    Period Weeks    Status On-going    Target Date 11/19/21               PT Long Term Goals - 10/22/21 1545       PT LONG TERM GOAL #1   Title Improve TUG score to </= 20 secs with RW with supervision to reduce fall risk.    Baseline 32 secs with RW    Time 12    Period Weeks    Status New    Target Date 01/14/22      PT LONG TERM GOAL #2   Title Pt will amb. 500' with RW with SBA on flat, even surface for increased community accessibility.    Baseline 69' with RW    Time 12    Period Weeks    Status New    Target Date 01/14/22       PT LONG TERM GOAL #3   Title Increase Berg balance test score from 13/56 to >/= 28/56 to reduce fall risk.    Time 12    Period Weeks    Status New    Target Date 01/14/22      PT LONG TERM GOAL #4   Title Increase gait velocity by at least 1.0 ft/sec with RW for incr. gait efficiency.    Time 12    Period Weeks    Status New    Target Date 01/14/22      PT LONG TERM GOAL #5   Title Perform sit to stand from chair without UE support to demo improved LE strength and balance.    Time 12    Period Weeks    Status New    Target Date 01/14/22  Additional Long Term Goals   Additional Long Term Goals Yes      PT LONG TERM GOAL #6   Title Negotiate 4 steps with 1 hand rail using step by step sequence with SBA.    Time 12    Period Weeks    Status New    Target Date 01/14/22      PT LONG TERM GOAL #7   Title Independent in updated HEP for balance & strengthening.    Time 12    Period Weeks    Status New    Target Date 01/14/22                   Plan - 11/02/21 1025     Clinical Impression Statement Today's skilled session initially estabished baseline score for 10 meter gait speed at 1.17 ft/sec with RW. Also initiated HEP for strengthening this session as well. Pt fatigued with session, needing rest breaks throughout. Re-direction to tasks needed as well. The pt should benefit from continued PT to progress toward unmet goals.    Personal Factors and Comorbidities Fitness;Comorbidity 2;Behavior Pattern    Comorbidities CAD, ESRD on HD M-W-Fri, CKD stage V, iron deficiency anemia, DM type 2, bronchial asthma, chronic diastolic CHF, s/p Rt basal ganglia CVA, steal syndrome RUE with fistula implant    Examination-Activity Limitations Bathing;Bed Mobility;Locomotion Level;Transfers;Reach Overhead;Squat;Stairs;Stand;Toileting;Hygiene/Grooming;Bend;Carry    Examination-Participation Restrictions Cleaning;Community Activity;Driving;Interpersonal Relationship;Laundry;Meal  Prep;Shop    Stability/Clinical Decision Making Evolving/Moderate complexity    Rehab Potential Good    PT Frequency 2x / week    PT Duration 12 weeks    PT Treatment/Interventions ADLs/Self Care Home Management;DME Instruction;Gait training;Stair training;Therapeutic activities;Therapeutic exercise;Balance training;Neuromuscular re-education;Patient/family education    PT Next Visit Plan add balance ex's to HEP; continue to address strengthening, activity tolerance and balance; do 3" walk test when pt is able    PT Home Exercise Plan Access Code: [email or web address]    Consulted and Agree with Plan of Care Patient;Other (Comment)   CNA - Ms. Sherrod            Patient will benefit from skilled therapeutic intervention in order to improve the following deficits and impairments:  Abnormal gait, Decreased activity tolerance, Decreased balance, Decreased strength, Decreased endurance, Decreased range of motion, Impaired sensation, Impaired UE functional use  Visit Diagnosis: Other abnormalities of gait and mobility  Muscle weakness (generalized)  Unsteadiness on feet     Problem List Patient Active Problem List   Diagnosis Date Noted   Left hemiparesis ([place]) 09/24/2021   Abnormality of gait 09/24/2021   Pain of right hand 08/06/2021   Steal syndrome of dialysis vascular access ([place]) 08/06/2021   Subclavian steal syndrome of right subclavian artery 06/25/2021   Right hand weakness 06/25/2021   Stroke-like symptoms 05/06/2021   Bandemia    Cerebrovascular accident (CVA) of right basal ganglia ([place]) 04/20/2021   External hemorrhoids    Basal ganglia infarction ([place]) 04/16/2021   ESRD on dialysis ([place]) 04/16/2021   Hypertensive urgency 03/10/2021   Hilar enlargement    Anemia of chronic disease    Chronic diastolic CHF (congestive heart failure) ([place]) 03/09/2021   CKD (chronic kidney disease), stage V ([place]) 03/08/2021   Diabetic retinopathy ([place]) 03/08/2021   Hyperglycemia due to type  2 diabetes mellitus ([place]) 03/08/2021   Pure hypercholesterolemia 03/08/2021   Anemia in chronic kidney disease 09/24/2020   Uncontrolled type 2 diabetes mellitus with hyperglycemia ([place]) 03/19/2019   Osteopenia 09/04/2018   Migraine 09/04/2018  Asthma 09/04/2018   Pseudophakia of both eyes 07/17/2018   Pseudophakia of left eye 07/03/2018   Open angle with borderline findings and high glaucoma risk in left eye 07/03/2018   Age-related nuclear cataract of right eye 07/03/2018   CAD (coronary artery disease), native coronary artery 04/27/2017   Abnormal nuclear stress test 04/04/2017   Dyspnea on exertion 03/09/2017   Appendicitis    Age-related hypermature cataract of both eyes 12/20/2016   Uterine leiomyoma 11/27/2012   Dyslipidemia (high LDL; low HDL) 11/25/2011   Obesity (BMI 30-39.9) 11/25/2011   Hypertensive disorder 11/21/2011   Type 2 diabetes mellitus ([place]) 11/21/2011   Abnormal EKG 11/21/2011    Willow Ora, PTA, [facility] [address] [place], [place] [address] [phone] 11/02/21, 12:01 PM   Name: MARISAL SWAREY MRN: [id number] Date of Birth: 1951/01/27

## 2021-11-02 NOTE — Therapy (Signed)
[facility] [address] [place], [name], [address] Phone: [phone]   Fax:  [phone]  Occupational Therapy Treatment  Patient Details  Name: Carolyn Russo MRN: [id number] Date of Birth: May 16, 1951 Referring Provider (OT): Dr. Dagoberto Ligas   Encounter Date: 11/02/2021   OT End of Session - 11/02/21 1134     Visit Number 2    Number of Visits 25    Date for OT Re-Evaluation 01/13/22    Authorization Type Medicare, State BCBS    Authorization - Visit Number 2    Authorization - Number of Visits 10    Progress Note Due on Visit 10    OT Start Time 1105    OT Stop Time 1143    OT Time Calculation (min) 38 min             Past Medical History:  Diagnosis Date   Acute GI bleeding    Allergy    Anemia    Anterior chest wall pain    Appendicitis 1965   Asthma    Body mass index 37.0-37.9, adult    Breast pain    Cataract    both eyes   CHF (congestive heart failure) ([place])    Chronic kidney disease    stage 5 - on dialysis   Cognitive change 04/20/2021   r/t cva 03/2821   Dehydration 2014   Deviated septum 1971   Diabetes mellitus    no meds   Dyspnea 2014   Extrinsic asthma    WITH ASTHMA ATTACK   Fibroid 1980   GERD (gastroesophageal reflux disease)    Heart murmur    Hx gestational diabetes    Hyperlipidemia    Hypertension 2014   Inguinal hernia 1959   Malaise and fatigue 2014   Non-IgE mediated allergic asthma 2014   Obesity    Pelvic pain    Pregnancy, high-risk 1985   Stroke ([place]) 04/20/2021   (CVA) of right basal ganglia   Tonsillitis 1968   Uterine fibroid 1980    Past Surgical History:  Procedure Laterality Date   APPENDECTOMY  3235   BASCILIC VEIN TRANSPOSITION Right 04/30/2021   Procedure: RIGHT FIRST STAGE [place];  Surgeon: Angelia Mould, MD;  Location: [facility] OR;  Service: Vascular;  Laterality: Right;   CESAREAN SECTION  1985   COLONOSCOPY      ESOPHAGOGASTRODUODENOSCOPY (EGD) WITH PROPOFOL N/A 04/18/2021   Procedure: ESOPHAGOGASTRODUODENOSCOPY (EGD) WITH PROPOFOL;  Surgeon: Gatha Mayer, MD;  Location: [facility] ENDOSCOPY;  Service: Endoscopy;  Laterality: N/A;   EYE SURGERY     bilateral cataract    FLEXIBLE SIGMOIDOSCOPY N/A 04/18/2021   Procedure: FLEXIBLE SIGMOIDOSCOPY;  Surgeon: Gatha Mayer, MD;  Location: [facility] ENDOSCOPY;  Service: Endoscopy;  Laterality: N/A;   HERNIA REPAIR  1959   IR FLUORO GUIDE CV LINE RIGHT  03/18/2021   IR US GUIDE VASC ACCESS RIGHT  03/18/2021   LEFT HEART CATH AND CORONARY ANGIOGRAPHY N/A 04/04/2017   Procedure: Left Heart Cath and Coronary Angiography;  Surgeon: Belva Crome, MD;  Location: [facility] CV LAB;  Service: Cardiovascular;  Laterality: N/A;   MYOMECTOMY  1980, 2004, 2007   RHINOPLASTY  1971   ROTATOR CUFF REPAIR  2003   SURGICAL REPAIR OF HEMORRHAGE  2015   TONSILLECTOMY  1968    There were no vitals filed for this visit.   Subjective Assessment - 11/02/21 1103     Subjective  Pt reprots  pain in RUE    Pertinent History Carolyn Russo is a 71 y.o. female with PMHx of obesity, DM, HTN, HLD, ESRD on HD who presented with left sided weakness for 3 days on 04/16/2021. Pt was diagnosed with R basal ganglia infarct Pt went to rehab but then transferred back to acute service on 7/13 for likely seizure. Pt returned to CIR and was d/c home on 05/27/21. Pt received HHOT and PT. Pt has R hand weakness s/p fistula placement. PMH: diabetes, hypertension, hyperlipidemia, ESRD on HD MWF.    Patient Stated Goals to be like I was prior to my stroke    Currently in Pain? Yes    Pain Score 6     Pain Location Hand    Pain Orientation Right    Pain Descriptors / Indicators Aching    Pain Type Neuropathic pain    Pain Onset More than a month ago    Pain Frequency Intermittent    Aggravating Factors  pressure on hand    Pain Relieving Factors rest                         Flipping  playing cards with right and left UE's. Min difficulty with LUE and max difficulty with RUE, and mod v.c Removing wooden dowel pegs from pegboard with RUE, with min difficulty and v.c  Then replacing with LUE min difficulty , v.c to avoid compensation Ambulating while performing environmental scanning , pt missed approximately 50% of items and pt reports fatigue requesting to sit down.          OT Education - 11/02/21 1133     Education Details Closed chain shoulder flexion and chest press in supine , importance of increasing activity level at home   Person(s) Educated Patient;Caregiver(s)    Methods Explanation;Demonstration;Verbal cues;Handout    Comprehension Verbalized understanding;Returned demonstration;Verbal cues required;Tactile cues required              OT Short Term Goals - 10/21/21 1347       OT SHORT TERM GOAL #1   Title I with initial HEP    Time 4    Period Weeks    Status New    Target Date 11/18/21      OT SHORT TERM GOAL #2   Title Pt will donn shirt  and bathe UB with min A consistently    Time 4    Period Weeks    Status New      OT SHORT TERM GOAL #3   Title Pt will be I with bilateral UE positioning including splinting  to minimize pain and risk for contracture and injury.    Time 4    Period Weeks    Status New      OT SHORT TERM GOAL #4   Title Pt will demonstrate 60* LUE shoulder flexion in prep for functional reach    Time 4    Period Weeks    Status New      OT SHORT TERM GOAL #5   Title Pt will demonstrate improved bilateral UE functional use as eveidenced by increasing bilateral box/ blocks score by 3 blocks.    Time 4    Period Weeks    Status New               OT Long Term Goals - 10/21/21 1350       OT LONG TERM GOAL #1   Title Pt  will perform UB dressing with setup and LB dressing with min A.    Time 12    Period Weeks    Status New      OT LONG TERM GOAL #2   Title Pt will bathe herself with min A.    Time  12    Period Weeks    Status New      OT LONG TERM GOAL #3   Title Pt will perform simple home management/ cooking tasks with min A.    Time 12    Period Weeks    Status New      OT LONG TERM GOAL #4   Title Pt will retrieve a lightweight object at 75* shoulder flexion with LUE.    Time 12    Period Weeks    Status New      OT LONG TERM GOAL #5   Title Pt will increase RUE grip strength to 15 lbs for increased functional use.    Time 12    Period Weeks    Status New      OT LONG TERM GOAL #6   Title Pt will demonstrate ability to write her name and address with 100% legibility using AE prn.    Time 12    Period Weeks    Status New      OT LONG TERM GOAL #7   Title Pt will utilize RUE at least 75% of the time for ADLS/ IADLS with pain no greater than 4/10.    Time 12    Period Weeks    Status New                   Plan - 11/02/21 1134     Clinical Impression Statement Pt is progressing slowly towards goals. She remains limited by pain, poor endurance, and drowsiness.    OT Occupational Profile and History Detailed Assessment- Review of Records and additional review of physical, cognitive, psychosocial history related to current functional performance    Occupational performance deficits (Please refer to evaluation for details): ADL's;IADL's;Play;Social Participation;Rest and Sleep    Body Structure / Function / Physical Skills ADL;Endurance;UE functional use;Balance;Flexibility;Pain;FMC;ROM;Gait;Coordination;GMC;Sensation;Decreased knowledge of precautions;Decreased knowledge of use of DME;IADL;Dexterity;Strength;Mobility    Rehab Potential Good    Clinical Decision Making Limited treatment options, no task modification necessary    Comorbidities Affecting Occupational Performance: May have comorbidities impacting occupational performance    Modification or Assistance to Complete Evaluation  Min-Moderate modification of tasks or assist with assess necessary to  complete eval    OT Frequency 2x / week    OT Duration 12 weeks    OT Treatment/Interventions Self-care/ADL training;Ultrasound;DME and/or AE instruction;Patient/family education;Balance training;Passive range of motion;Paraffin;Cryotherapy;Fluidtherapy;Splinting;Functional Mobility Training;Electrical Stimulation;Moist Heat;Therapeutic exercise;Manual Therapy;Therapeutic activities;Neuromuscular education    Plan anti-claw splint RUE and exercise splint    Consulted and Agree with Plan of Care Patient;Other (Comment)   caregiver            Patient will benefit from skilled therapeutic intervention in order to improve the following deficits and impairments:   Body Structure / Function / Physical Skills: ADL, Endurance, UE functional use, Balance, Flexibility, Pain, FMC, ROM, Gait, Coordination, GMC, Sensation, Decreased knowledge of precautions, Decreased knowledge of use of DME, IADL, Dexterity, Strength, Mobility       Visit Diagnosis: Muscle weakness (generalized)  Unsteadiness on feet  Other lack of coordination  Acute pain of left shoulder  Pain in right hand  Hemiplegia and hemiparesis following cerebral  infarction affecting left non-dominant side (HCC)  Other disturbances of skin sensation    Problem List Patient Active Problem List   Diagnosis Date Noted   Left hemiparesis ([place]) 09/24/2021   Abnormality of gait 09/24/2021   Pain of right hand 08/06/2021   Steal syndrome of dialysis vascular access ([place]) 08/06/2021   Subclavian steal syndrome of right subclavian artery 06/25/2021   Right hand weakness 06/25/2021   Stroke-like symptoms 05/06/2021   Bandemia    Cerebrovascular accident (CVA) of right basal ganglia ([place]) 04/20/2021   External hemorrhoids    Basal ganglia infarction ([place]) 04/16/2021   ESRD on dialysis ([place]) 04/16/2021   Hypertensive urgency 03/10/2021   Hilar enlargement    Anemia of chronic disease    Chronic diastolic CHF (congestive heart  failure) ([name]) 03/09/2021   CKD (chronic kidney disease), stage V ([place]) 03/08/2021   Diabetic retinopathy ([place]) 03/08/2021   Hyperglycemia due to type 2 diabetes mellitus ([place]) 03/08/2021   Pure hypercholesterolemia 03/08/2021   Anemia in chronic kidney disease 09/24/2020   Uncontrolled type 2 diabetes mellitus with hyperglycemia ([place]) 03/19/2019   Osteopenia 09/04/2018   Migraine 09/04/2018   Asthma 09/04/2018   Pseudophakia of both eyes 07/17/2018   Pseudophakia of left eye 07/03/2018   Open angle with borderline findings and high glaucoma risk in left eye 07/03/2018   Age-related nuclear cataract of right eye 07/03/2018   CAD (coronary artery disease), native coronary artery 04/27/2017   Abnormal nuclear stress test 04/04/2017   Dyspnea on exertion 03/09/2017   Appendicitis    Age-related hypermature cataract of both eyes 12/20/2016   Uterine leiomyoma 11/27/2012   Dyslipidemia (high LDL; low HDL) 11/25/2011   Obesity (BMI 30-39.9) 11/25/2011   Hypertensive disorder 11/21/2011   Type 2 diabetes mellitus ([place]) 11/21/2011   Abnormal EKG 11/21/2011    Ruhaan Nordahl, OT 11/02/2021, 12:00 PM  [facility] [address] [place], [name], [address] Phone: [phone]   Fax:  [phone]  Name: VIRGIL SLINGER MRN: [id number] Date of Birth: 12/19/1950

## 2021-11-02 NOTE — Patient Instructions (Signed)
Shoulder: Flexion (Supine)    Hold a paper towel roll between your hands with hands facing one another,  Perform chest press, bending and straightening elbows towards ceiling then with elbows straight raise arms to shoulder heigh in pain free range of motion. 2 sets of 5 reps each  You will need assistance from your husband or caregiver to straighten your elbow for left arm.

## 2021-11-03 ENCOUNTER — Encounter (HOSPITAL_BASED_OUTPATIENT_CLINIC_OR_DEPARTMENT_OTHER): Payer: Self-pay | Admitting: *Deleted

## 2021-11-03 ENCOUNTER — Emergency Department (HOSPITAL_BASED_OUTPATIENT_CLINIC_OR_DEPARTMENT_OTHER)
Admission: EM | Admit: 2021-11-03 | Discharge: 2021-11-04 | Disposition: A | Discharge status: Home / Self Care | Payer: Medicare PPO | Attending: Emergency Medicine | Admitting: Emergency Medicine

## 2021-11-03 ENCOUNTER — Other Ambulatory Visit: Payer: Self-pay

## 2021-11-03 DIAGNOSIS — Z794 Long term (current) use of insulin: Secondary | ICD-10-CM | POA: Diagnosis not present

## 2021-11-03 DIAGNOSIS — N186 End stage renal disease: Secondary | ICD-10-CM | POA: Insufficient documentation

## 2021-11-03 DIAGNOSIS — L03211 Cellulitis of face: Secondary | ICD-10-CM

## 2021-11-03 DIAGNOSIS — R220 Localized swelling, mass and lump, head: Secondary | ICD-10-CM | POA: Diagnosis present

## 2021-11-03 DIAGNOSIS — I2510 Atherosclerotic heart disease of native coronary artery without angina pectoris: Secondary | ICD-10-CM | POA: Insufficient documentation

## 2021-11-03 DIAGNOSIS — Z992 Dependence on renal dialysis: Secondary | ICD-10-CM | POA: Insufficient documentation

## 2021-11-03 DIAGNOSIS — I120 Hypertensive chronic kidney disease with stage 5 chronic kidney disease or end stage renal disease: Secondary | ICD-10-CM | POA: Diagnosis not present

## 2021-11-03 DIAGNOSIS — Z79899 Other long term (current) drug therapy: Secondary | ICD-10-CM | POA: Insufficient documentation

## 2021-11-03 DIAGNOSIS — R0981 Nasal congestion: Secondary | ICD-10-CM | POA: Insufficient documentation

## 2021-11-03 DIAGNOSIS — E1122 Type 2 diabetes mellitus with diabetic chronic kidney disease: Secondary | ICD-10-CM | POA: Diagnosis not present

## 2021-11-03 MED ORDER — CLINDAMYCIN PHOSPHATE 600 MG/50ML IV SOLN
600.0000 mg | Freq: Once | INTRAVENOUS | Status: AC
  Administered 2021-11-03: 600 mg via INTRAVENOUS
  Filled 2021-11-03: qty 50

## 2021-11-03 NOTE — ED Provider Notes (Signed)
[facility] EMERGENCY DEPT Provider Note   CSN: [id number] Arrival date & time: 11/03/21  1957     History  Chief Complaint  Patient presents with   Cellulitis    Carolyn Russo is a 71 y.o. female.  Patient is a 71 year old female with past medical history of end-stage renal disease on hemodialysis, hypertension, coronary artery disease, diabetes.  Patient presenting today for right sided nose pain, swelling, and drainage.  She was diagnosed with a sinus infection last week, then was started on Keflex.  She had some improvement, but now is having pain and swelling to the right side of her nose and inside of her nostril.  She describes some drainage from within the nose, but denies any difficulty breathing or fever.  She denies any eye pain.  The history is provided by the patient.      Home Medications Prior to Admission medications   Medication Sig Start Date End Date Taking? Authorizing Provider  acetaminophen (OFIRMEV) 10 MG/ML SOLN Take by mouth. 08/04/21 08/03/22  [provider]  albuterol (VENTOLIN HFA) 108 (90 Base) MCG/ACT inhaler Inhale 2 puffs into the lungs every 4 (four) hours as needed for wheezing or shortness of breath. 08/18/21   Ghumman, Ramandeep, NP  BESIVANCE 0.6 % SUSP Place 1 drop into both eyes See admin instructions. After  eye injections 10/26/18   [provider]  carvedilol (COREG) 3.125 MG tablet Take 1 tablet (3.125 mg total) by mouth 2 (two) times daily with a meal. 11/01/21   [name], MD  cephALEXin (KEFLEX) 250 MG capsule Take 1 capsule (250 mg total) by mouth every 12 (twelve) hours. Be sure to dose after dialysis on dialysis days 10/28/21   [name], MD  clopidogrel (PLAVIX) 75 MG tablet Take 1 tablet (75 mg total) by mouth daily. 08/24/21   [name], MD  Continuous Blood Gluc Receiver (FREESTYLE LIBRE 2 READER) DEVI by Does not apply route.    [provider]  diclofenac Sodium (VOLTAREN) 1 %  GEL Apply 2 g topically 4 (four) times daily. To left wrist 05/26/21   Love, Ivan Anchors, PA-C  diphenhydrAMINE (BENADRYL) 25 MG tablet Take 25 mg by mouth every 6 (six) hours as needed for itching.    [provider]  fluticasone (FLONASE) 50 MCG/ACT nasal spray Place 2 sprays into the nose daily as needed for allergies.     [provider]  hydrALAZINE (APRESOLINE) 10 MG tablet Take 1 tablet (10 mg total) by mouth every 8 (eight) hours as needed (Sys BP >180, hold on HD days). 05/26/21   Love, Ivan Anchors, PA-C  hydrocortisone (ANUSOL-HC) 2.5 % rectal cream Place 1 application rectally 2 (two) times daily as needed for hemorrhoids or anal itching. 04/20/21   Ghimire, Henreitta Leber, MD  hydrocortisone cream 1 % Apply to affected area 2 times daily 06/14/21 06/14/22  Minette Brine, FNP  hydrOXYzine (ATARAX/VISTARIL) 25 MG tablet Take 1 tablet (25 mg total) by mouth at bedtime as needed. 06/14/21   Minette Brine, FNP  hydrOXYzine (VISTARIL) 25 MG capsule Take 25 mg by mouth 3 (three) times daily as needed. Dose unknown    [provider]  insulin aspart protamine - aspart (NOVOLOG MIX 70/30 FLEXPEN) (70-30) 100 UNIT/ML FlexPen 35uam / 5u/25upm    [provider]  insulin glargine (LANTUS) 100 UNIT/ML Solostar Pen Inject 13 Units into the skin daily. 08/18/21   Bary Castilla, NP  Insulin Pen Needle (BD PEN NEEDLE NANO 2ND  GEN) 32G X 4 MM MISC See admin instructions. 07/15/21   [provider]  iron sucrose in sodium chloride 0.9 % 100 mL Inject into the vein. 06/16/21   [provider]  levETIRAcetam (KEPPRA) 500 MG tablet Take 1 tablet (500 mg total) by mouth at bedtime. 06/25/21   Lovorn, Jinny Blossom, MD  melatonin 3 MG TABS tablet Take 1 tablet (3 mg total) by mouth at bedtime. 05/26/21   Love, Ivan Anchors, PA-C  Methoxy PEG-Epoetin Beta (MIRCERA IJ) Mircera 07/21/21 07/20/22  [provider]  methylphenidate (RITALIN) 5 MG tablet Take 1 tablet (5 mg total) by mouth 2  (two) times daily with breakfast and lunch. 09/02/21   Lovorn, Jinny Blossom, MD  mometasone-formoterol (DULERA) 200-5 MCG/ACT AERO Inhale 2 puffs into the lungs 2 (two) times daily. 08/18/21   Bary Castilla, NP  Nutritional Supplements (,FEEDING SUPPLEMENT, PROSOURCE PLUS) liquid Take 30 mLs by mouth 2 (two) times daily between meals. 05/27/21   Love, Ivan Anchors, PA-C  ondansetron (ZOFRAN) 4 MG tablet Take 1 tablet (4 mg total) by mouth every 6 (six) hours as needed for nausea or vomiting. 06/14/21 06/14/22  Minette Brine, FNP  OneTouch Delica Lancets 49F MISC Use as directed to check blood sugars before breakfast and dinner dx: e11.65 08/18/21   Bary Castilla, NP  pantoprazole (PROTONIX) 40 MG tablet Take 1 tablet (40 mg total) by mouth daily. 08/18/21   Ghumman, Ramandeep, NP  phenylephrine (,USE FOR PREPARATION-H,) 0.25 % suppository Place 1 suppository rectally 2 (two) times daily. 06/14/21   Minette Brine, FNP  polyethylene glycol powder (GLYCOLAX/MIRALAX) 17 GM/SCOOP powder Take 17 g by mouth daily. 08/18/21   Ghumman, Milford Cage, NP  rosuvastatin (CRESTOR) 20 MG tablet Take 1 tablet (20 mg total) by mouth daily. 08/18/21   Ghumman, Ramandeep, NP  senna-docusate (SENOKOT-S) 8.6-50 MG tablet Take 2 tablets by mouth 2 (two) times daily. 06/29/21   Bary Castilla, NP  iron polysaccharides (NIFEREX) 150 MG capsule Take 1 capsule (150 mg total) by mouth daily. Patient not taking: No sig reported 03/24/21 04/16/21  Nita Sells, MD      Allergies    Food, Other, Wheat bran, Statins, Iodine, Shellfish allergy, Sitagliptin, Tetracycline, Tetracycline hcl, Tetracyclines & related, and Contrast media [iodinated contrast media]    Review of Systems   Review of Systems  All other systems reviewed and are negative.  Physical Exam Updated Vital Signs BP (!) 183/63    Pulse 80    Temp 98.9 F (37.2 C)    Resp (!) 21    Ht 5' 3.5" (1.613 m)    Wt 89.4 kg    SpO2 100%    BMI 34.35 kg/m  Physical  Exam Vitals and nursing note reviewed.  Constitutional:      General: She is not in acute distress.    Appearance: Normal appearance. She is not ill-appearing.  HENT:     Head: Normocephalic and atraumatic.     Nose:     Comments: The right side of the nose is noted to be swollen and erythematous.  There is purulent drainage within the nasal canal.  Septum is midline. Eyes:     Extraocular Movements: Extraocular movements intact.     Pupils: Pupils are equal, round, and reactive to light.  Pulmonary:     Effort: Pulmonary effort is normal.  Neurological:     Mental Status: She is alert and oriented to person, place, and time.    ED Results / Procedures / Treatments  Labs (all labs ordered are listed, but only abnormal results are displayed) Labs Reviewed - No data to display  EKG None  Radiology No results found.  Procedures Procedures    Medications Ordered in ED Medications - No data to display  ED Course/ Medical Decision Making/ A&P  Patient presenting here with pain and swelling to the right nose/nostril.  She has purulent drainage within the nare on the right.  This appears to be a possible abscess/cellulitis of the nose.  Patient will be given IV clindamycin and I will have her follow-up with ENT in the next 2 days.  She has seen Dr. Constance Holster in the past.  Final Clinical Impression(s) / ED Diagnoses Final diagnoses:  None    Rx / DC Orders ED Discharge Orders     None         Veryl Speak, MD 11/04/21 [phone]

## 2021-11-03 NOTE — ED Notes (Signed)
EDP at bedside  

## 2021-11-03 NOTE — ED Triage Notes (Signed)
Pt has redness, swelling and pain to nose; pt has been taking antibiotics with no improvement

## 2021-11-04 ENCOUNTER — Encounter: Payer: Self-pay | Admitting: Occupational Therapy

## 2021-11-04 ENCOUNTER — Ambulatory Visit: Payer: Medicare PPO | Admitting: Occupational Therapy

## 2021-11-04 ENCOUNTER — Ambulatory Visit: Payer: Medicare PPO | Admitting: Physical Therapy

## 2021-11-04 VITALS — BP 223/94

## 2021-11-04 DIAGNOSIS — R2681 Unsteadiness on feet: Secondary | ICD-10-CM

## 2021-11-04 DIAGNOSIS — M6281 Muscle weakness (generalized): Secondary | ICD-10-CM

## 2021-11-04 DIAGNOSIS — R278 Other lack of coordination: Secondary | ICD-10-CM

## 2021-11-04 MED ORDER — CLINDAMYCIN HCL 300 MG PO CAPS: 300.0000 mg | ORAL_CAPSULE | Freq: Four times a day (QID) | ORAL | 0 refills | Status: DC

## 2021-11-04 NOTE — Therapy (Signed)
[facility] [address] [place], [name], [address] Phone: [phone]   Fax:  [phone]  Patient Details  Name: Carolyn Russo MRN: [id number] Date of Birth: [birth year] Referring Provider:  [name], MD  Encounter Date: 11/04/2021 Pt arrived to therapy today stating she was in the ED last night. She has cellulitis of the nose. Pt's BP was elevated to 223/ 94. Pt/ caregiver state she did not take her BP meds.  Therapy was witheld. Pt was transported to car via w/c and therapist transferred pt into vehicle to leave with hired caregiver. Pt/ caregiver were instructed to call 911 if pt develops CVA symptoms. Pt/ caregiver were instructed to monitor BP after pt takes her BP meds and to Call PCP if it is not improving.  Sevannah Madia, OT 11/04/2021, 9:56 AM  [facility] [address] [place], [name], [address] Phone: [phone]   Fax:  [phone]

## 2021-11-04 NOTE — Discharge Instructions (Signed)
Begin taking clindamycin as prescribed.  Follow-up with Dr. Constance Holster in the next 2 days for a recheck, and return to the ER in the meantime if symptoms significantly worsen or change.  Dr. Janeice [name] contact information has been provided in this discharge summary for you to call tomorrow morning and make these arrangements.

## 2021-11-04 NOTE — ED Notes (Signed)
Patient verbalizes understanding of discharge instructions. Opportunity for questioning and answers were provided. Armband removed by staff, pt discharged from ED to home via POV  

## 2021-11-09 ENCOUNTER — Encounter: Payer: Self-pay | Admitting: Physical Therapy

## 2021-11-09 ENCOUNTER — Ambulatory Visit: Payer: Medicare PPO | Admitting: Physical Therapy

## 2021-11-09 ENCOUNTER — Ambulatory Visit: Payer: Medicare PPO | Admitting: Occupational Therapy

## 2021-11-09 ENCOUNTER — Other Ambulatory Visit: Payer: Self-pay

## 2021-11-09 DIAGNOSIS — R2681 Unsteadiness on feet: Secondary | ICD-10-CM

## 2021-11-09 DIAGNOSIS — R2689 Other abnormalities of gait and mobility: Secondary | ICD-10-CM | POA: Diagnosis not present

## 2021-11-09 DIAGNOSIS — M6281 Muscle weakness (generalized): Secondary | ICD-10-CM

## 2021-11-09 DIAGNOSIS — I69354 Hemiplegia and hemiparesis following cerebral infarction affecting left non-dominant side: Secondary | ICD-10-CM

## 2021-11-09 NOTE — Therapy (Signed)
[place] [address] [place], [name], [address] Phone: [phone]   Fax:  [phone]  Physical Therapy Treatment  Patient Details  Name: Carolyn Russo MRN: [id number] Date of Birth: [birth year] Referring Provider (PT): Courtney Heys, MD   Encounter Date: 11/09/2021   PT End of Session - 11/09/21 1106     Visit Number 3    Number of Visits 25    Date for PT Re-Evaluation 01/14/22    Authorization Type Medicare    Authorization Time Period 10-21-21 - 01-14-22    PT Start Time 1104    PT Stop Time 1145    PT Time Calculation (min) 41 min    Equipment Utilized During Treatment Gait belt    Activity Tolerance Patient limited by fatigue    Behavior During Therapy [facility] for tasks assessed/performed             Past Medical History:  Diagnosis Date   Acute GI bleeding    Allergy    Anemia    Anterior chest wall pain    Appendicitis 1965   Asthma    Body mass index 37.0-37.9, adult    Breast pain    Cataract    both eyes   CHF (congestive heart failure) ([place])    Chronic kidney disease    stage 5 - on dialysis   Cognitive change 04/20/2021   r/t cva 03/2821   Dehydration 2014   Deviated septum 1971   Diabetes mellitus    no meds   Dyspnea 2014   Extrinsic asthma    WITH ASTHMA ATTACK   Fibroid 1980   GERD (gastroesophageal reflux disease)    Heart murmur    Hx gestational diabetes    Hyperlipidemia    Hypertension 2014   Inguinal hernia 1959   Malaise and fatigue 2014   Non-IgE mediated allergic asthma 2014   Obesity    Pelvic pain    Pregnancy, high-risk 1985   Stroke ([place]) 04/20/2021   (CVA) of right basal ganglia   Tonsillitis 1968   Uterine fibroid 1980    Past Surgical History:  Procedure Laterality Date   APPENDECTOMY  5462   BASCILIC VEIN TRANSPOSITION Right 04/30/2021   Procedure: RIGHT FIRST STAGE [place];  Surgeon: Angelia Mould, MD;  Location: [facility]  OR;  Service: Vascular;  Laterality: Right;   CESAREAN SECTION  1985   COLONOSCOPY     ESOPHAGOGASTRODUODENOSCOPY (EGD) WITH PROPOFOL N/A 04/18/2021   Procedure: ESOPHAGOGASTRODUODENOSCOPY (EGD) WITH PROPOFOL;  Surgeon: Gatha Mayer, MD;  Location[facility] ENDOSCOPY;  Service: Endoscopy;  Laterality: N/A;   EYE SURGERY     bilateral cataract    FLEXIBLE SIGMOIDOSCOPY N/A 04/18/2021   Procedure: FLEXIBLE SIGMOIDOSCOPY;  Surgeon: Gatha Mayer, MD;  Location: [facility] ENDOSCOPY;  Service: Endoscopy;  Laterality: N/A;   HERNIA REPAIR  1959   IR FLUORO GUIDE CV LINE RIGHT  03/18/2021   IR US GUIDE VASC ACCESS RIGHT  03/18/2021   LEFT HEART CATH AND CORONARY ANGIOGRAPHY N/A 04/04/2017   Procedure: Left Heart Cath and Coronary Angiography;  Surgeon: Belva Crome, MD;  Location: [place] CV LAB;  Service: Cardiovascular;  Laterality: N/A;   MYOMECTOMY  1980, 2004, 2007   RHINOPLASTY  1971   ROTATOR CUFF REPAIR  2003   SURGICAL REPAIR OF HEMORRHAGE  2015   TONSILLECTOMY  1968    There were no vitals filed for this visit.   Subjective Assessment -  11/09/21 1105     Subjective No new complaints. No falls. Does get dizzy/woozy when she changes position quickly. No pain, however right wrist is sore after OT session.    Patient is accompained by: --   caregiver, Ms. Sherrod   Pertinent History ESRD with HD M-W-Fri:, obesity, DM type 2, CAD, iron deficiency anemia, bronchial asthma, chronic diastolic CHF, Rt basal ganglia CVA on 04-16-21, steal syndrome RUE due to dialysis vascular access (s/p fistula placement), CKD stage V:  Rt hand weakness; Lt hemiparesis due to CVA    Patient Stated Goals "be independent with mobility and self care"; "be able to drive again"    Currently in Pain? No/denies    Pain Score 0-No pain                [facility] PT Assessment - 11/09/21 1110       6 Minute Walk- Baseline   6 Minute Walk- Baseline yes    BP (mmHg) 136/88    HR (bpm) 71    Modified Borg Scale for  Dyspnea 0- Nothing at all    Perceived Rate of Exertion (Borg) 6-      6 Minute walk- Post Test   6 Minute Walk Post Test yes    BP (mmHg) 154/78    HR (bpm) 81    Modified Borg Scale for Dyspnea 0- Nothing at all    Perceived Rate of Exertion (Borg) 12-      6 minute walk test results    Aerobic Endurance Distance Walked 226    Endurance additional comments 3 minute walk test with RW. Seated rest break at about 1.5 minutes for ~30 seconds.                   [place] Adult PT Treatment/Exercise - 11/09/21 1110       Transfers   Transfers Sit to Stand;Stand to Sit    Sit to Stand 5: Supervision;With upper extremity assist;From bed;From chair/3-in-1    Stand to Sit 5: Supervision;With upper extremity assist;To bed;To chair/3-in-1      Ambulation/Gait   Ambulation/Gait Yes    Ambulation/Gait Assistance 5: Supervision;4: Min guard    Ambulation Distance (Feet) 80 Feet   x2, 100 x1, plus 3 minute walk test distance   Assistive device Rolling walker    Gait Pattern Step-through pattern;Decreased hip/knee flexion - left;Decreased step length - left    Ambulation Surface Level;Indoor      Neuro Re-ed    Neuro Re-ed Details  for strengthening/muscle re-ed: added balance ex's to HEP this session. Refer to [place] for full details. min guard assist for safety;            Issued to HEP this session: Access Code: [email or web address] URL: [email or web address] Date: 11/09/2021 Prepared by: Willow Ora  Exercises Sit to Stand with Counter Support - 1 x daily - 5 x weekly - 1 sets - 5-10 reps Seated Knee Extension with Resistance - 1 x daily - 5 x weekly - 1 sets - 10 reps Seated Hip Abduction with Resistance - 1 x daily - 5 x weekly - 1 sets - 10 reps Heel Raises with Counter Support - 1 x daily - 5 x weekly - 1 sets - 10 reps Standing March with Counter Support - 1 x daily - 5 x weekly - 1 sets - 8-10 reps  Added to session today: Standing Balance with Eyes Closed - 1  x daily - 5 x weekly - 1  sets - 3 reps - 30 hold Standing Single Leg Stance with Counter Support - 1 x daily - 5 x weekly - 1 sets - 3 reps - 10 seconds hold Standing Tandem Balance with Counter Support - 1 x daily - 5 x weekly - 1 sets - 10 reps        PT Education - 11/09/21 2113     Education Details results of 3 minute walk test; addition of balance ex's to HEP    Person(s) Educated Patient;Caregiver(s)    Methods Explanation;Demonstration;Verbal cues;Handout    Comprehension Verbalized understanding;Returned demonstration;Verbal cues required;Need further instruction              PT Short Term Goals - 11/02/21 1156       PT SHORT TERM GOAL #1   Title Improve TUG score from 32 secs to </= 27 secs with RW with SBA to demo improved functional mobility. (all STGs due 11/19/21)    Baseline 32 secs    Time 4    Period Weeks    Status On-going    Target Date 11/19/21      PT SHORT TERM GOAL #2   Title Pt will perform 5 times sit to stand transfers from standard chair with only 1 UE support with SBA to demo increased LE strength and balance.    Baseline Bil. UE support with CGA to min asssit needed for sit to stand - 10-21-21    Time 4    Period Weeks    Status On-going    Target Date 11/19/21      PT SHORT TERM GOAL #3   Title Increase Berg balance test score from 13/30 to >/= 19/30 to demo improved balance.    Baseline 13/30    Time 4    Period Weeks    Status On-going    Target Date 11/19/21      PT SHORT TERM GOAL #4   Title Increase gait velocity by at least .4 ft/sec with RW to demo improved gait efficiency.    Baseline 11/02/21: 1.17 ft/sec with RW as baselne    Time 4    Period Weeks    Status On-going    Target Date 11/19/21      PT SHORT TERM GOAL #5   Title Amb. 63' with RW with SBA on flat, even surface for increased community accessibility.    Baseline 26' with RW due to fatigue    Time 4    Period Weeks    Status On-going    Target Date  11/19/21      PT SHORT TERM GOAL #6   Title Independent with HEP for strengthening & balance exs.    Time 4    Period Weeks    Status On-going    Target Date 11/19/21               PT Long Term Goals - 10/22/21 1545       PT LONG TERM GOAL #1   Title Improve TUG score to </= 20 secs with RW with supervision to reduce fall risk.    Baseline 32 secs with RW    Time 12    Period Weeks    Status New    Target Date 01/14/22      PT LONG TERM GOAL #2   Title Pt will amb. 500' with RW with SBA on flat, even surface for increased community accessibility.    Baseline 73' with RW  Time 12    Period Weeks    Status New    Target Date 01/14/22      PT LONG TERM GOAL #3   Title Increase Berg balance test score from 13/56 to >/= 28/56 to reduce fall risk.    Time 12    Period Weeks    Status New    Target Date 01/14/22      PT LONG TERM GOAL #4   Title Increase gait velocity by at least 1.0 ft/sec with RW for incr. gait efficiency.    Time 12    Period Weeks    Status New    Target Date 01/14/22      PT LONG TERM GOAL #5   Title Perform sit to stand from chair without UE support to demo improved LE strength and balance.    Time 12    Period Weeks    Status New    Target Date 01/14/22      Additional Long Term Goals   Additional Long Term Goals Yes      PT LONG TERM GOAL #6   Title Negotiate 4 steps with 1 hand rail using step by step sequence with SBA.    Time 12    Period Weeks    Status New    Target Date 01/14/22      PT LONG TERM GOAL #7   Title Independent in updated HEP for balance & strengthening.    Time 12    Period Weeks    Status New    Target Date 01/14/22                   Plan - 11/09/21 1107     Clinical Impression Statement Today's skilled session continued to focus on gait training with walker, activity tolerance and additions of balance ex's to pt's HEP. Also completed the 3 minute walk test today with PT to set goals. Pt  continues to have a decreased gait speed and limited activity tolerance needing encouragement to continue with task at hand. The pt did increase her overall gait distance this session. The pt is making progress toward goals and should benefit from continued PT to progress toward unmet goals.    Personal Factors and Comorbidities Fitness;Comorbidity 2;Behavior Pattern    Comorbidities CAD, ESRD on HD M-W-Fri, CKD stage V, iron deficiency anemia, DM type 2, bronchial asthma, chronic diastolic CHF, s/p Rt basal ganglia CVA, steal syndrome RUE with fistula implant    Examination-Activity Limitations Bathing;Bed Mobility;Locomotion Level;Transfers;Reach Overhead;Squat;Stairs;Stand;Toileting;Hygiene/Grooming;Bend;Carry    Examination-Participation Restrictions Cleaning;Community Activity;Driving;Interpersonal Relationship;Laundry;Meal Prep;Shop    Stability/Clinical Decision Making Evolving/Moderate complexity    Rehab Potential Good    PT Frequency 2x / week    PT Duration 12 weeks    PT Treatment/Interventions ADLs/Self Care Home Management;DME Instruction;Gait training;Stair training;Therapeutic activities;Therapeutic exercise;Balance training;Neuromuscular re-education;Patient/family education    PT Next Visit Plan ?scifit/nustep for strengthening/activity tolerance; continue to address strengthening and balance; Primary PT to set goals for 3 minute walk test competed today    PT Home Exercise Plan Access Code: [email or web address]    Consulted and Agree with Plan of Care Patient;Other (Comment)   CNA - Ms. Sherrod            Patient will benefit from skilled therapeutic intervention in order to improve the following deficits and impairments:  Abnormal gait, Decreased activity tolerance, Decreased balance, Decreased strength, Decreased endurance, Decreased range of motion, Impaired sensation, Impaired UE functional use  Visit Diagnosis: Muscle  weakness (generalized)  Unsteadiness on feet  Other  abnormalities of gait and mobility  Hemiplegia and hemiparesis following cerebral infarction affecting left non-dominant side [facility])     Problem List Patient Active Problem List   Diagnosis Date Noted   Left hemiparesis ([place]) 09/24/2021   Abnormality of gait 09/24/2021   Pain of right hand 08/06/2021   Steal syndrome of dialysis vascular access ([place]) 08/06/2021   Subclavian steal syndrome of right subclavian artery 06/25/2021   Right hand weakness 06/25/2021   Stroke-like symptoms 05/06/2021   Bandemia    Cerebrovascular accident (CVA) of right basal ganglia ([place]) 04/20/2021   External hemorrhoids    Basal ganglia infarction ([place]) 04/16/2021   ESRD on dialysis ([place]) 04/16/2021   Hypertensive urgency 03/10/2021   Hilar enlargement    Anemia of chronic disease    Chronic diastolic CHF (congestive heart failure) ([place]) 03/09/2021   CKD (chronic kidney disease), stage V ([place]) 03/08/2021   Diabetic retinopathy ([place]) 03/08/2021   Hyperglycemia due to type 2 diabetes mellitus ([place]) 03/08/2021   Pure hypercholesterolemia 03/08/2021   Anemia in chronic kidney disease 09/24/2020   Uncontrolled type 2 diabetes mellitus with hyperglycemia ([place]) 03/19/2019   Osteopenia 09/04/2018   Migraine 09/04/2018   Asthma 09/04/2018   Pseudophakia of both eyes 07/17/2018   Pseudophakia of left eye 07/03/2018   Open angle with borderline findings and high glaucoma risk in left eye 07/03/2018   Age-related nuclear cataract of right eye 07/03/2018   CAD (coronary artery disease), native coronary artery 04/27/2017   Abnormal nuclear stress test 04/04/2017   Dyspnea on exertion 03/09/2017   Appendicitis    Age-related hypermature cataract of both eyes 12/20/2016   Uterine leiomyoma 11/27/2012   Dyslipidemia (high LDL; low HDL) 11/25/2011   Obesity (BMI 30-39.9) 11/25/2011   Hypertensive disorder 11/21/2011   Type 2 diabetes mellitus ([place]) 11/21/2011   Abnormal EKG 11/21/2011    Willow Ora, PTA,  [facility] Outpatient Neuro [facility] [address] [place], [place] [address] [phone] 11/09/21, 10:41 PM   Name: KIRA HARTL MRN: [id number] Date of Birth: 10-02-1951

## 2021-11-09 NOTE — Patient Instructions (Signed)
Access Code: [email or web address] URL: [email or web address] Date: 11/09/2021 Prepared by: Willow Ora  Exercises Sit to Stand with Counter Support - 1 x daily - 5 x weekly - 1 sets - 5-10 reps Seated Knee Extension with Resistance - 1 x daily - 5 x weekly - 1 sets - 10 reps Seated Hip Abduction with Resistance - 1 x daily - 5 x weekly - 1 sets - 10 reps Heel Raises with Counter Support - 1 x daily - 5 x weekly - 1 sets - 10 reps Standing March with Counter Support - 1 x daily - 5 x weekly - 1 sets - 8-10 reps Standing Balance with Eyes Closed - 1 x daily - 5 x weekly - 1 sets - 3 reps - 30 hold Standing Single Leg Stance with Counter Support - 1 x daily - 5 x weekly - 1 sets - 3 reps - 10 seconds hold Standing Tandem Balance with Counter Support - 1 x daily - 5 x weekly - 1 sets - 10 reps

## 2021-11-09 NOTE — Patient Instructions (Signed)
°  Your Splint This splint should initially be fitted by a healthcare practitioner.  The healthcare practitioner is responsible for providing wearing instructions and precautions to the patient, other healthcare practitioners and care provider involved in the patient's care.  This splint was custom made for you. Please read the following instructions to learn about wearing and caring for your splint.  Precautions Should your splint cause any of the following problems, remove the splint immediately and contact your therapist/physician. Swelling Severe Pain Pressure Areas Stiffness Numbness  Do not wear your splint while operating machinery unless it has been fabricated for that purpose.  When To Wear Your Splint Where your splint according to your therapist/physician instructions. Daytime for 1-2 hours, then gradually build up to wearing all day. Monitor for pressure sores and/or blisters  Care and Cleaning of Your Splint Keep your splint away from open flames. Your splint will lose its shape in temperatures over 135 degrees Farenheit, ( in car windows, near radiators, ovens or in hot water).  Never make any adjustments to your splint, if the splint needs adjusting remove it and make an appointment to see your therapist. Your splint may be cleaned with rubbing alcohol 1-2x/day.  Do not immerse in hot water over 135 degrees Farenheit.

## 2021-11-09 NOTE — Therapy (Signed)
[facility] [address] [place], [name], [address] Phone: [phone]   Fax:  [phone]  Occupational Therapy Treatment  Patient Details  Name: Carolyn Russo MRN: [id number] Date of Birth: 08-13-1951 Referring Provider (OT): Dr. Dagoberto Ligas   Encounter Date: 11/09/2021   OT End of Session - 11/09/21 1111     Visit Number 3    Number of Visits 25    Date for OT Re-Evaluation 01/13/22    Authorization Type Medicare, Carolyn Russo - Visit Number 3    Authorization - Number of Visits 10    Progress Note Due on Visit 10    OT Start Time 1015    OT Stop Time 1100    OT Time Calculation (min) 45 min    Activity Tolerance Patient tolerated treatment well    Behavior During Therapy [facility] for tasks assessed/performed             Past Medical History:  Diagnosis Date   Acute GI bleeding    Allergy    Anemia    Anterior chest wall pain    Appendicitis 1965   Asthma    Body mass index 37.0-37.9, adult    Breast pain    Cataract    both eyes   CHF (congestive heart failure) ([place])    Chronic kidney disease    stage 5 - on dialysis   Cognitive change 04/20/2021   r/t cva 03/2821   Dehydration 2014   Deviated septum 1971   Diabetes mellitus    no meds   Dyspnea 2014   Extrinsic asthma    WITH ASTHMA ATTACK   Fibroid 1980   GERD (gastroesophageal reflux disease)    Heart murmur    Hx gestational diabetes    Hyperlipidemia    Hypertension 2014   Inguinal hernia 1959   Malaise and fatigue 2014   Non-IgE mediated allergic asthma 2014   Obesity    Pelvic pain    Pregnancy, high-risk 1985   Stroke ([place]) 04/20/2021   (CVA) of right basal ganglia   Tonsillitis 1968   Uterine fibroid 1980    Past Surgical History:  Procedure Laterality Date   APPENDECTOMY  0623   [place] Right 04/30/2021   Procedure: RIGHT FIRST STAGE [place];  Surgeon: Angelia Mould, MD;  Location: [facility] OR;  Service: Vascular;  Laterality: Right;   CESAREAN SECTION  1985   COLONOSCOPY     ESOPHAGOGASTRODUODENOSCOPY (EGD) WITH PROPOFOL N/A 04/18/2021   Procedure: ESOPHAGOGASTRODUODENOSCOPY (EGD) WITH PROPOFOL;  Surgeon: Gatha Mayer, MD;  Location: [facility] ENDOSCOPY;  Service: Endoscopy;  Laterality: N/A;   EYE SURGERY     bilateral cataract    FLEXIBLE SIGMOIDOSCOPY N/A 04/18/2021   Procedure: FLEXIBLE SIGMOIDOSCOPY;  Surgeon: Gatha Mayer, MD;  Location: [facility] ENDOSCOPY;  Service: Endoscopy;  Laterality: N/A;   HERNIA REPAIR  1959   IR FLUORO GUIDE CV LINE RIGHT  03/18/2021   IR US GUIDE VASC ACCESS RIGHT  03/18/2021   LEFT HEART CATH AND CORONARY ANGIOGRAPHY N/A 04/04/2017   Procedure: Left Heart Cath and Coronary Angiography;  Surgeon: Belva Crome, MD;  Location: [facility] CV LAB;  Service: Cardiovascular;  Laterality: N/A;   MYOMECTOMY  1980, 2004, 2007   RHINOPLASTY  1971   ROTATOR CUFF REPAIR  2003   SURGICAL REPAIR OF HEMORRHAGE  2015   TONSILLECTOMY  1968    There were no vitals  filed for this visit.   Subjective Assessment - 11/09/21 1056     Subjective  No pain today in my Rt arm/hand    Pertinent History [name] is a 71 y.o. female with PMHx of obesity, DM, HTN, HLD, ESRD on HD who presented with left sided weakness for 3 days on 04/16/2021. Pt was diagnosed with R basal ganglia infarct Pt went to rehab but then transferred back to acute service on 7/13 for likely seizure. Pt returned to CIR and was d/c home on 05/27/21. Pt received HHOT and PT. Pt has R hand weakness s/p fistula placement. PMH: diabetes, hypertension, hyperlipidemia, ESRD on HD MWF.    Patient Stated Goals to be like I was prior to my stroke    Currently in Pain? No/denies    Pain Onset More than a month ago             Fabricated and issued anti-claw splint to wear during the day. Reviewed splint wear and care with pt and caregiver. Pt very drowsy and fell  asleep at times during session.   Began fabrication of resting hand splint for pm wear today. To finish and issued next session                     OT Education - 11/09/21 1059     Education Details splint wear and care    Person(s) Educated Patient;Caregiver(s)    Methods Explanation;Demonstration;Verbal cues;Handout    Comprehension Verbalized understanding;Returned demonstration;Verbal cues required              OT Short Term Goals - 11/09/21 1111       OT SHORT TERM GOAL #1   Title I with initial HEP    Time 4    Period Weeks    Status New    Target Date 11/18/21      OT SHORT TERM GOAL #2   Title Pt will donn shirt  and bathe UB with min A consistently    Time 4    Period Weeks    Status New      OT SHORT TERM GOAL #3   Title Pt will be I with bilateral UE positioning including splinting  to minimize pain and risk for contracture and injury.    Time 4    Period Weeks    Status New      OT SHORT TERM GOAL #4   Title Pt will demonstrate 60* LUE shoulder flexion in prep for functional reach    Time 4    Period Weeks    Status New      OT SHORT TERM GOAL #5   Title Pt will demonstrate improved bilateral UE functional use as eveidenced by increasing bilateral box/ blocks score by 3 blocks.    Time 4    Period Weeks    Status New               OT Long Term Goals - 10/21/21 1350       OT LONG TERM GOAL #1   Title Pt will perform UB dressing with setup and LB dressing with min A.    Time 12    Period Weeks    Status New      OT LONG TERM GOAL #2   Title Pt will bathe herself with min A.    Time 12    Period Weeks    Status New      OT  LONG TERM GOAL #3   Title Pt will perform simple home management/ cooking tasks with min A.    Time 12    Period Weeks    Status New      OT LONG TERM GOAL #4   Title Pt will retrieve a lightweight object at 75* shoulder flexion with LUE.    Time 12    Period Weeks    Status New      OT  LONG TERM GOAL #5   Title Pt will increase RUE grip strength to 15 lbs for increased functional use.    Time 12    Period Weeks    Status New      OT LONG TERM GOAL #6   Title Pt will demonstrate ability to write her name and address with 100% legibility using AE prn.    Time 12    Period Weeks    Status New      OT LONG TERM GOAL #7   Title Pt will utilize RUE at least 75% of the time for ADLS/ IADLS with pain no greater than 4/10.    Time 12    Period Weeks    Status New                   Plan - 11/09/21 1112     Clinical Impression Statement Pt is progressing slowly towards goals. She remains limited by pain, poor endurance, and drowsiness. Pt falling aspleep during today's session    OT Occupational Profile and History Detailed Assessment- Review of Records and additional review of physical, cognitive, psychosocial history related to current functional performance    Occupational performance deficits (Please refer to evaluation for details): ADL's;IADL's;Play;Social Participation;Rest and Sleep    Body Structure / Function / Physical Skills ADL;Endurance;UE functional use;Balance;Flexibility;Pain;FMC;ROM;Gait;Coordination;GMC;Sensation;Decreased knowledge of precautions;Decreased knowledge of use of DME;IADL;Dexterity;Strength;Mobility    Rehab Potential Good    Clinical Decision Making Limited treatment options, no task modification necessary    Comorbidities Affecting Occupational Performance: May have comorbidities impacting occupational performance    Modification or Assistance to Complete Evaluation  Min-Moderate modification of tasks or assist with assess necessary to complete eval    OT Frequency 2x / week    OT Duration 12 weeks    OT Treatment/Interventions Self-care/ADL training;Ultrasound;DME and/or AE instruction;Patient/family education;Balance training;Passive range of motion;Paraffin;Cryotherapy;Fluidtherapy;Splinting;Functional Mobility Training;Electrical  Stimulation;Moist Heat;Therapeutic exercise;Manual Therapy;Therapeutic activities;Neuromuscular education    Plan assess anti-claw splint, finish and issue resting hand splint for pm wear, review and add to HEP prn    Consulted and Agree with Plan of Care Patient;Other (Comment)   caregiver            Patient will benefit from skilled therapeutic intervention in order to improve the following deficits and impairments:   Body Structure / Function / Physical Skills: ADL, Endurance, UE functional use, Balance, Flexibility, Pain, FMC, ROM, Gait, Coordination, GMC, Sensation, Decreased knowledge of precautions, Decreased knowledge of use of DME, IADL, Dexterity, Strength, Mobility       Visit Diagnosis: Hemiplegia and hemiparesis following cerebral infarction affecting left non-dominant side [facility])    Problem List Patient Active Problem List   Diagnosis Date Noted   Left hemiparesis ([place]) 09/24/2021   Abnormality of gait 09/24/2021   Pain of right hand 08/06/2021   Steal syndrome of dialysis vascular access ([place]) 08/06/2021   Subclavian steal syndrome of right subclavian artery 06/25/2021   Right hand weakness 06/25/2021   Stroke-like symptoms 05/06/2021   Bandemia  Cerebrovascular accident (CVA) of right basal ganglia ([place]) 04/20/2021   External hemorrhoids    Basal ganglia infarction ([place]) 04/16/2021   ESRD on dialysis ([place]) 04/16/2021   Hypertensive urgency 03/10/2021   Hilar enlargement    Anemia of chronic disease    Chronic diastolic CHF (congestive heart failure) ([place]) 03/09/2021   CKD (chronic kidney disease), stage V ([place]) 03/08/2021   Diabetic retinopathy ([place]) 03/08/2021   Hyperglycemia due to type 2 diabetes mellitus ([place]) 03/08/2021   Pure hypercholesterolemia 03/08/2021   Anemia in chronic kidney disease 09/24/2020   Uncontrolled type 2 diabetes mellitus with hyperglycemia ([place]) 03/19/2019   Osteopenia 09/04/2018   Migraine 09/04/2018   Asthma 09/04/2018    Pseudophakia of both eyes 07/17/2018   Pseudophakia of left eye 07/03/2018   Open angle with borderline findings and high glaucoma risk in left eye 07/03/2018   Age-related nuclear cataract of right eye 07/03/2018   CAD (coronary artery disease), native coronary artery 04/27/2017   Abnormal nuclear stress test 04/04/2017   Dyspnea on exertion 03/09/2017   Appendicitis    Age-related hypermature cataract of both eyes 12/20/2016   Uterine leiomyoma 11/27/2012   Dyslipidemia (high LDL; low HDL) 11/25/2011   Obesity (BMI 30-39.9) 11/25/2011   Hypertensive disorder 11/21/2011   Type 2 diabetes mellitus ([place]) 11/21/2011   Abnormal EKG 11/21/2011    Carey Bullocks, OTR/L 11/09/2021, 11:14 AM  [facility] [address] [place], [name], [address] Phone: [phone]   Fax:  [phone]  Name: Carolyn Russo MRN: [id number] Date of Birth: 13-Aug-1951

## 2021-11-10 ENCOUNTER — Telehealth: Payer: Self-pay

## 2021-11-10 NOTE — Chronic Care Management (AMB) (Signed)
° ° °  Called Arely MALEIA WEEMS, No answer, left message of appointment on 11-16-2021 at 2:00 via telephone visit with Orlando Penner, Pharm D. Notified to have all medications, supplements, blood pressure and/or blood sugar logs available during appointment and to return call if need to reschedule.  Care Gaps: PNA Vac overdue Shingrix overdue Tdap overdue Yearly Ophthalmology overdue Colonoscopy overdue 6 month A1C overdue  Star Rating Drug: Rosuvastatin 20 mg- Last filled 08-18-2021 90 DS [facility]  Any gaps in medications fill history? No  [place] Pharmacist Assistant [phone]

## 2021-11-11 ENCOUNTER — Encounter: Payer: Self-pay | Admitting: Physical Therapy

## 2021-11-11 ENCOUNTER — Other Ambulatory Visit: Payer: Self-pay

## 2021-11-11 ENCOUNTER — Ambulatory Visit: Payer: Medicare PPO | Admitting: Physical Therapy

## 2021-11-11 ENCOUNTER — Ambulatory Visit: Payer: Medicare PPO | Admitting: Occupational Therapy

## 2021-11-11 DIAGNOSIS — R2689 Other abnormalities of gait and mobility: Secondary | ICD-10-CM

## 2021-11-11 DIAGNOSIS — R2681 Unsteadiness on feet: Secondary | ICD-10-CM

## 2021-11-11 DIAGNOSIS — R208 Other disturbances of skin sensation: Secondary | ICD-10-CM

## 2021-11-11 DIAGNOSIS — M79641 Pain in right hand: Secondary | ICD-10-CM

## 2021-11-11 DIAGNOSIS — M6281 Muscle weakness (generalized): Secondary | ICD-10-CM

## 2021-11-11 DIAGNOSIS — I69354 Hemiplegia and hemiparesis following cerebral infarction affecting left non-dominant side: Secondary | ICD-10-CM

## 2021-11-11 DIAGNOSIS — R278 Other lack of coordination: Secondary | ICD-10-CM

## 2021-11-11 DIAGNOSIS — M25512 Pain in left shoulder: Secondary | ICD-10-CM

## 2021-11-11 NOTE — Therapy (Signed)
[facility] [address] [place], [name], [address] Phone: [phone]   Fax:  [phone]  Physical Therapy Treatment  Patient Details  Name: Carolyn Russo MRN: [id number] Date of Birth: 06-13-1951 Referring Provider (PT): Courtney Heys, MD   Encounter Date: 11/11/2021   PT End of Session - 11/11/21 0942     Visit Number 4    Number of Visits 25    Date for PT Re-Evaluation 01/14/22    Authorization Type [employer] [place] submitted    Authorization Time Period --    PT Start Time [phone]   running behind today   PT Stop Time 1015   - 5 minutes for bathroom unbillable- 33 skilled minutes   PT Time Calculation (min) 38 min    Equipment Utilized During Treatment Gait belt    Activity Tolerance Patient limited by fatigue;Patient tolerated treatment well    Behavior During Therapy WFL for tasks assessed/performed             Past Medical History:  Diagnosis Date   Acute GI bleeding    Allergy    Anemia    Anterior chest wall pain    Appendicitis 1965   Asthma    Body mass index 37.0-37.9, adult    Breast pain    Cataract    both eyes   CHF (congestive heart failure) ([place])    Chronic kidney disease    stage 5 - on dialysis   Cognitive change 04/20/2021   r/t cva 03/2821   Dehydration 2014   Deviated septum 1971   Diabetes mellitus    no meds   Dyspnea 2014   Extrinsic asthma    WITH ASTHMA ATTACK   Fibroid 1980   GERD (gastroesophageal reflux disease)    Heart murmur    Hx gestational diabetes    Hyperlipidemia    Hypertension 2014   Inguinal hernia 1959   Malaise and fatigue 2014   Non-IgE mediated allergic asthma 2014   Obesity    Pelvic pain    Pregnancy, high-risk 1985   Stroke ([place]) 04/20/2021   (CVA) of right basal ganglia   Tonsillitis 1968   Uterine fibroid 1980    Past Surgical History:  Procedure Laterality Date   APPENDECTOMY  9323   BASCILIC VEIN TRANSPOSITION Right  04/30/2021   Procedure: RIGHT FIRST STAGE [place];  Surgeon: Angelia Mould, MD;  Location[facility] OR;  Service: Vascular;  Laterality: Right;   CESAREAN SECTION  1985   COLONOSCOPY     ESOPHAGOGASTRODUODENOSCOPY (EGD) WITH PROPOFOL N/A 04/18/2021   Procedure: ESOPHAGOGASTRODUODENOSCOPY (EGD) WITH PROPOFOL;  Surgeon: Gatha Mayer, MD;  Location: [facility] ENDOSCOPY;  Service: Endoscopy;  Laterality: N/A;   EYE SURGERY     bilateral cataract    FLEXIBLE SIGMOIDOSCOPY N/A 04/18/2021   Procedure: FLEXIBLE SIGMOIDOSCOPY;  Surgeon: Gatha Mayer, MD;  Location: [facility] ENDOSCOPY;  Service: Endoscopy;  Laterality: N/A;   HERNIA REPAIR  1959   IR FLUORO GUIDE CV LINE RIGHT  03/18/2021   IR US GUIDE VASC ACCESS RIGHT  03/18/2021   LEFT HEART CATH AND CORONARY ANGIOGRAPHY N/A 04/04/2017   Procedure: Left Heart Cath and Coronary Angiography;  Surgeon: Belva Crome, MD;  Location: [facility] CV LAB;  Service: Cardiovascular;  Laterality: N/A;   MYOMECTOMY  1980, 2004, 2007   RHINOPLASTY  1971   ROTATOR CUFF REPAIR  2003   SURGICAL REPAIR OF HEMORRHAGE  2015   TONSILLECTOMY  1968    There were no vitals filed for this visit.   Subjective Assessment - 11/11/21 0941     Subjective No new complaints. No falls. Having some right hip pain today, reports she has been trying to put more weight on it with standing/walking.    Pertinent History ESRD with HD M-W-Fri:, obesity, DM type 2, CAD, iron deficiency anemia, bronchial asthma, chronic diastolic CHF, Rt basal ganglia CVA on 04-16-21, steal syndrome RUE due to dialysis vascular access (s/p fistula placement), CKD stage V:  Rt hand weakness; Lt hemiparesis due to CVA    Patient Stated Goals "be independent with mobility and self care"; "be able to drive again"    Currently in Pain? Yes    Pain Score 5     Pain Location Hip    Pain Orientation Right    Pain Descriptors / Indicators Aching    Pain Type Acute pain;Chronic pain    Pain Onset  More than a month ago    Pain Frequency Intermittent    Aggravating Factors  increased weight bearing on right hip    Pain Relieving Factors rest                      [facility] Adult PT Treatment/Exercise - 11/11/21 0944       Transfers   Transfers Sit to Stand;Stand to Sit    Sit to Stand 5: Supervision;With upper extremity assist;From bed;From chair/3-in-1    Stand to Sit 5: Supervision;With upper extremity assist;To bed;To chair/3-in-1      Ambulation/Gait   Ambulation/Gait Yes    Ambulation/Gait Assistance 5: Supervision;4: Min guard    Ambulation Distance (Feet) 100 Feet   x1, 80 x2   Assistive device Rolling walker    Gait Pattern Step-through pattern;Decreased hip/knee flexion - left;Decreased step length - left    Ambulation Surface Level;Indoor      Knee/Hip Exercises: Aerobic   Nustep UE/LE's level 4.0 x 3 minutes, rest for 2 minutes, then 3 additional minutes with goal >/= 40 steps per minute for strengthening and activity tolerance.      Knee/Hip Exercises: Seated   Long Arc Quad AROM;Strengthening;Both;1 set;10 reps;Weights;Limitations    Long Arc Quad Weight 2 lbs.    Long Arc Quad Limitations alteranting LE's with cues for slow movements    Marching AROM;Strengthening;Both;1 set;10 reps;Weights;Limitations    Hamstring Curl AROM;AAROM;Strengthening;Both;1 set;10 reps;Limitations    Hamstring Limitations use of red band resistance with cues/occasional assistance for controlled movements                       PT Short Term Goals - 11/02/21 1156       PT SHORT TERM GOAL #1   Title Improve TUG score from 32 secs to </= 27 secs with RW with SBA to demo improved functional mobility. (all STGs due 11/19/21)    Baseline 32 secs    Time 4    Period Weeks    Status On-going    Target Date 11/19/21      PT SHORT TERM GOAL #2   Title Pt will perform 5 times sit to stand transfers from standard chair with only 1 UE support with SBA to demo  increased LE strength and balance.    Baseline Bil. UE support with CGA to min asssit needed for sit to stand - 10-21-21    Time 4    Period Weeks    Status On-going  Target Date 11/19/21      PT SHORT TERM GOAL #3   Title Increase Berg balance test score from 13/30 to >/= 19/30 to demo improved balance.    Baseline 13/30    Time 4    Period Weeks    Status On-going    Target Date 11/19/21      PT SHORT TERM GOAL #4   Title Increase gait velocity by at least .4 ft/sec with RW to demo improved gait efficiency.    Baseline 11/02/21: 1.17 ft/sec with RW as baselne    Time 4    Period Weeks    Status On-going    Target Date 11/19/21      PT SHORT TERM GOAL #5   Title Amb. 66' with RW with SBA on flat, even surface for increased community accessibility.    Baseline 85' with RW due to fatigue    Time 4    Period Weeks    Status On-going    Target Date 11/19/21      PT SHORT TERM GOAL #6   Title Independent with HEP for strengthening & balance exs.    Time 4    Period Weeks    Status On-going    Target Date 11/19/21               PT Long Term Goals - 10/22/21 1545       PT LONG TERM GOAL #1   Title Improve TUG score to </= 20 secs with RW with supervision to reduce fall risk.    Baseline 32 secs with RW    Time 12    Period Weeks    Status New    Target Date 01/14/22      PT LONG TERM GOAL #2   Title Pt will amb. 500' with RW with SBA on flat, even surface for increased community accessibility.    Baseline 68' with RW    Time 12    Period Weeks    Status New    Target Date 01/14/22      PT LONG TERM GOAL #3   Title Increase Berg balance test score from 13/56 to >/= 28/56 to reduce fall risk.    Time 12    Period Weeks    Status New    Target Date 01/14/22      PT LONG TERM GOAL #4   Title Increase gait velocity by at least 1.0 ft/sec with RW for incr. gait efficiency.    Time 12    Period Weeks    Status New    Target Date 01/14/22      PT LONG  TERM GOAL #5   Title Perform sit to stand from chair without UE support to demo improved LE strength and balance.    Time 12    Period Weeks    Status New    Target Date 01/14/22      Additional Long Term Goals   Additional Long Term Goals Yes      PT LONG TERM GOAL #6   Title Negotiate 4 steps with 1 hand rail using step by step sequence with SBA.    Time 12    Period Weeks    Status New    Target Date 01/14/22      PT LONG TERM GOAL #7   Title Independent in updated HEP for balance & strengthening.    Time 12    Period Weeks    Status New  Target Date 01/14/22                   Plan - 11/11/21 0943     Clinical Impression Statement Today's skilled session continued to focus on activity tolerance, gait with walker and strengthening with no issues noted or repored other than fatigue. Rest breaks taken as needed. The pt is making steady progress and should benefit from continued PT to progress toward unmet goals.    Personal Factors and Comorbidities Fitness;Comorbidity 2;Behavior Pattern    Comorbidities CAD, ESRD on HD M-W-Fri, CKD stage V, iron deficiency anemia, DM type 2, bronchial asthma, chronic diastolic CHF, s/p Rt basal ganglia CVA, steal syndrome RUE with fistula implant    Examination-Activity Limitations Bathing;Bed Mobility;Locomotion Level;Transfers;Reach Overhead;Squat;Stairs;Stand;Toileting;Hygiene/Grooming;Bend;Carry    Examination-Participation Restrictions Cleaning;Community Activity;Driving;Interpersonal Relationship;Laundry;Meal Prep;Shop    Stability/Clinical Decision Making Evolving/Moderate complexity    Rehab Potential Good    PT Frequency 2x / week    PT Duration 12 weeks    PT Treatment/Interventions ADLs/Self Care Home Management;DME Instruction;Gait training;Stair training;Therapeutic activities;Therapeutic exercise;Balance training;Neuromuscular re-education;Patient/family education    PT Next Visit Plan continue with use of scifit/nustep  for strengthening/activity tolerance; continue to address strengthening and balance;    PT Home Exercise Plan Access Code: [email or web address]    Consulted and Agree with Plan of Care Patient;Other (Comment)   CNA - Ms. Sherrod            Patient will benefit from skilled therapeutic intervention in order to improve the following deficits and impairments:  Abnormal gait, Decreased activity tolerance, Decreased balance, Decreased strength, Decreased endurance, Decreased range of motion, Impaired sensation, Impaired UE functional use  Visit Diagnosis: Hemiplegia and hemiparesis following cerebral infarction affecting left non-dominant side (HCC)  Muscle weakness (generalized)  Unsteadiness on feet  Other abnormalities of gait and mobility     Problem List Patient Active Problem List   Diagnosis Date Noted   Left hemiparesis ([place]) 09/24/2021   Abnormality of gait 09/24/2021   Pain of right hand 08/06/2021   Steal syndrome of dialysis vascular access ([place]) 08/06/2021   Subclavian steal syndrome of right subclavian artery 06/25/2021   Right hand weakness 06/25/2021   Stroke-like symptoms 05/06/2021   Bandemia    Cerebrovascular accident (CVA) of right basal ganglia ([place]) 04/20/2021   External hemorrhoids    Basal ganglia infarction ([place]) 04/16/2021   ESRD on dialysis ([place]) 04/16/2021   Hypertensive urgency 03/10/2021   Hilar enlargement    Anemia of chronic disease    Chronic diastolic CHF (congestive heart failure) ([place]) 03/09/2021   CKD (chronic kidney disease), stage V ([place]) 03/08/2021   Diabetic retinopathy ([place]) 03/08/2021   Hyperglycemia due to type 2 diabetes mellitus ([place]) 03/08/2021   Pure hypercholesterolemia 03/08/2021   Anemia in chronic kidney disease 09/24/2020   Uncontrolled type 2 diabetes mellitus with hyperglycemia ([place]) 03/19/2019   Osteopenia 09/04/2018   Migraine 09/04/2018   Asthma 09/04/2018   Pseudophakia of both eyes 07/17/2018   Pseudophakia of left eye  07/03/2018   Open angle with borderline findings and high glaucoma risk in left eye 07/03/2018   Age-related nuclear cataract of right eye 07/03/2018   CAD (coronary artery disease), native coronary artery 04/27/2017   Abnormal nuclear stress test 04/04/2017   Dyspnea on exertion 03/09/2017   Appendicitis    Age-related hypermature cataract of both eyes 12/20/2016   Uterine leiomyoma 11/27/2012   Dyslipidemia (high LDL; low HDL) 11/25/2011   Obesity (BMI 30-39.9) 11/25/2011   Hypertensive disorder  11/21/2011   Type 2 diabetes mellitus ([place]) 11/21/2011   Abnormal EKG 11/21/2011   Willow Ora, PTA, [facility] [address] [place], [place] [address] [phone] 11/11/21, 9:23 PM   Name: Carolyn Russo MRN: [id number] Date of Birth: [birth year]

## 2021-11-11 NOTE — Patient Instructions (Signed)
°  Your Splint This splint should initially be fitted by a healthcare practitioner.  The healthcare practitioner is responsible for providing wearing instructions and precautions to the patient, other healthcare practitioners and care provider involved in the patient's care.  This splint was custom made for you. Please read the following instructions to learn about wearing and caring for your splint.  Precautions Should your splint cause any of the following problems, remove the splint immediately and contact your therapist/physician. Swelling Severe Pain Pressure Areas Stiffness Numbness  Do not wear your splint while operating machinery unless it has been fabricated for that purpose.  When To Wear Your Splint Where your splint according to your therapist/physician instructions. Daytime for 1-2 hours while  resting only do not wear when up using walker STOP wearing if pressure areas or redness   Care and Cleaning of Your Splint Keep your splint away from open flames. Your splint will lose its shape in temperatures over 135 degrees Farenheit, ( in car windows, near radiators, ovens or in hot water).  Never make any adjustments to your splint, if the splint needs adjusting remove it and make an appointment to see your therapist. Your splint, including the cushion liner may be cleaned with soap and lukewarm water.  Do not immerse in hot water over 135 degrees Farenheit.

## 2021-11-11 NOTE — Therapy (Signed)
[facility] [address] [place], [name], [address] Phone: [phone]   Fax:  [phone]  Occupational Therapy Treatment  Patient Details  Name: Carolyn Russo MRN: [id number] Date of Birth: [birth year] Referring Provider (OT): Dr. Dagoberto Ligas   Encounter Date: 11/11/2021   OT End of Session - 11/11/21 1120     Visit Number 4    Number of Visits 25    Date for OT Re-Evaluation 01/13/22    Authorization Type Medicare, State BCBS    Authorization - Visit Number 4    Authorization - Number of Visits 10    Progress Note Due on Visit 10    OT Start Time 1018    OT Stop Time 1100    OT Time Calculation (min) 42 min    Activity Tolerance Patient tolerated treatment well    Behavior During Therapy [facility] for tasks assessed/performed             Past Medical History:  Diagnosis Date   Acute GI bleeding    Allergy    Anemia    Anterior chest wall pain    Appendicitis 1965   Asthma    Body mass index 37.0-37.9, adult    Breast pain    Cataract    both eyes   CHF (congestive heart failure) ([place])    Chronic kidney disease    stage 5 - on dialysis   Cognitive change 04/20/2021   r/t cva 03/2821   Dehydration 2014   Deviated septum 1971   Diabetes mellitus    no meds   Dyspnea 2014   Extrinsic asthma    WITH ASTHMA ATTACK   Fibroid 1980   GERD (gastroesophageal reflux disease)    Heart murmur    Hx gestational diabetes    Hyperlipidemia    Hypertension 2014   Inguinal hernia 1959   Malaise and fatigue 2014   Non-IgE mediated allergic asthma 2014   Obesity    Pelvic pain    Pregnancy, high-risk 1985   Stroke ([place]) 04/20/2021   (CVA) of right basal ganglia   Tonsillitis 1968   Uterine fibroid 1980    Past Surgical History:  Procedure Laterality Date   APPENDECTOMY  7169   [place] Right 04/30/2021   Procedure: RIGHT FIRST STAGE [place];  Surgeon: Angelia Mould, MD;  Location: [facility] OR;  Service: Vascular;  Laterality: Right;   CESAREAN SECTION  1985   COLONOSCOPY     ESOPHAGOGASTRODUODENOSCOPY (EGD) WITH PROPOFOL N/A 04/18/2021   Procedure: ESOPHAGOGASTRODUODENOSCOPY (EGD) WITH PROPOFOL;  Surgeon: Gatha Mayer, MD;  Location: [facility] ENDOSCOPY;  Service: Endoscopy;  Laterality: N/A;   EYE SURGERY     bilateral cataract    FLEXIBLE SIGMOIDOSCOPY N/A 04/18/2021   Procedure: FLEXIBLE SIGMOIDOSCOPY;  Surgeon: Gatha Mayer, MD;  Location: [facility] ENDOSCOPY;  Service: Endoscopy;  Laterality: N/A;   HERNIA REPAIR  1959   IR FLUORO GUIDE CV LINE RIGHT  03/18/2021   IR US GUIDE VASC ACCESS RIGHT  03/18/2021   LEFT HEART CATH AND CORONARY ANGIOGRAPHY N/A 04/04/2017   Procedure: Left Heart Cath and Coronary Angiography;  Surgeon: Belva Crome, MD;  Location: [facility] CV LAB;  Service: Cardiovascular;  Laterality: N/A;   MYOMECTOMY  1980, 2004, 2007   RHINOPLASTY  1971   ROTATOR CUFF REPAIR  2003   SURGICAL REPAIR OF HEMORRHAGE  2015   TONSILLECTOMY  1968    There were no vitals  filed for this visit.   Subjective Assessment - 11/11/21 1127     Subjective  Pt reports wearing anti claw splint most opf the day    Pertinent History Carolyn Russo is a 71 y.o. female with PMHx of obesity, DM, HTN, HLD, ESRD on HD who presented with left sided weakness for 3 days on 04/16/2021. Pt was diagnosed with R basal ganglia infarct Pt went to rehab but then transferred back to acute service on 7/13 for likely seizure. Pt returned to CIR and was d/c home on 05/27/21. Pt received HHOT and PT. Pt has R hand weakness s/p fistula placement. PMH: diabetes, hypertension, hyperlipidemia, ESRD on HD MWF.    Patient Stated Goals to be like I was prior to my stroke    Currently in Pain? Yes    Pain Score 5     Pain Location Hip    Pain Orientation Right    Pain Descriptors / Indicators Aching    Pain Type Acute pain    Pain Onset More than a month ago    Pain  Frequency Intermittent    Aggravating Factors  increased weightbearing on right hip    Pain Relieving Factors rest                      Treatment: therapist completed fabrication and fitting of resting hand splint. Pt/ caregiver were educated in application. While therapist added splint strapping and foam pad to prevent ulnar deviation  pt performed functional grasp/ release of wooden dowel pegs with right and left UE's with RUE wearing anti- claw splint. Splint appears to be fitting well. Therapist padded right hand grip on walker for increased comfort, she was instructed to remove anti-claw splint if she is unable to grasp hand grip adequately while wearing. Pt was instructed not to wear resting hand splint during ambulation. Donning vest with mod-max A, pt requires max verbal cueing/ encouragement.            OT Education - 11/11/21 1119     Education Details splint wear and care, precautions, pt was instructed only to wear 1-2 hours at a time during daytime at rest    Methods Explanation;Demonstration;Verbal cues;Handout    Comprehension Verbalized understanding;Returned demonstration;Verbal cues required              OT Short Term Goals - 11/09/21 1111       OT SHORT TERM GOAL #1   Title I with initial HEP    Time 4    Period Weeks    Status New    Target Date 11/18/21      OT SHORT TERM GOAL #2   Title Pt will donn shirt  and bathe UB with min A consistently    Time 4    Period Weeks    Status New      OT SHORT TERM GOAL #3   Title Pt will be I with bilateral UE positioning including splinting  to minimize pain and risk for contracture and injury.    Time 4    Period Weeks    Status New      OT SHORT TERM GOAL #4   Title Pt will demonstrate 60* LUE shoulder flexion in prep for functional reach    Time 4    Period Weeks    Status New      OT SHORT TERM GOAL #5   Title Pt will demonstrate improved bilateral UE functional use as  eveidenced  by increasing bilateral box/ blocks score by 3 blocks.    Time 4    Period Weeks    Status New               OT Long Term Goals - 10/21/21 1350       OT LONG TERM GOAL #1   Title Pt will perform UB dressing with setup and LB dressing with min A.    Time 12    Period Weeks    Status New      OT LONG TERM GOAL #2   Title Pt will bathe herself with min A.    Time 12    Period Weeks    Status New      OT LONG TERM GOAL #3   Title Pt will perform simple home management/ cooking tasks with min A.    Time 12    Period Weeks    Status New      OT LONG TERM GOAL #4   Title Pt will retrieve a lightweight object at 75* shoulder flexion with LUE.    Time 12    Period Weeks    Status New      OT LONG TERM GOAL #5   Title Pt will increase RUE grip strength to 15 lbs for increased functional use.    Time 12    Period Weeks    Status New      OT LONG TERM GOAL #6   Title Pt will demonstrate ability to write her name and address with 100% legibility using AE prn.    Time 12    Period Weeks    Status New      OT LONG TERM GOAL #7   Title Pt will utilize RUE at least 75% of the time for ADLS/ IADLS with pain no greater than 4/10.    Time 12    Period Weeks    Status New                   Plan - 11/11/21 1122     Clinical Impression Statement Therapist completed fitting of resting hand splint and pt/ caregiver were educated in wear and care    OT Occupational Profile and History Detailed Assessment- Review of Records and additional review of physical, cognitive, psychosocial history related to current functional performance    Occupational performance deficits (Please refer to evaluation for details): ADL's;IADL's;Play;Social Participation;Rest and Sleep    Body Structure / Function / Physical Skills ADL;Endurance;UE functional use;Balance;Flexibility;Pain;FMC;ROM;Gait;Coordination;GMC;Sensation;Decreased knowledge of precautions;Decreased knowledge of use of  DME;IADL;Dexterity;Strength;Mobility    Rehab Potential Good    OT Frequency 2x / week    OT Duration 12 weeks    OT Treatment/Interventions Self-care/ADL training;Ultrasound;DME and/or AE instruction;Patient/family education;Balance training;Passive range of motion;Paraffin;Cryotherapy;Fluidtherapy;Splinting;Functional Mobility Training;Electrical Stimulation;Moist Heat;Therapeutic exercise;Manual Therapy;Therapeutic activities;Neuromuscular education    Plan splint check for resting hand splint, ADL strategies donning shirt/ jacket ,functional use of UE's    Consulted and Agree with Plan of Care Patient;Other (Comment)   hired caregiver            Patient will benefit from skilled therapeutic intervention in order to improve the following deficits and impairments:   Body Structure / Function / Physical Skills: ADL, Endurance, UE functional use, Balance, Flexibility, Pain, FMC, ROM, Gait, Coordination, GMC, Sensation, Decreased knowledge of precautions, Decreased knowledge of use of DME, IADL, Dexterity, Strength, Mobility       Visit Diagnosis: Hemiplegia and hemiparesis following cerebral  infarction affecting left non-dominant side (HCC)  Muscle weakness (generalized)  Other abnormalities of gait and mobility  Other lack of coordination  Acute pain of left shoulder  Pain in right hand  Other disturbances of skin sensation    Problem List Patient Active Problem List   Diagnosis Date Noted   Left hemiparesis ([place]) 09/24/2021   Abnormality of gait 09/24/2021   Pain of right hand 08/06/2021   Steal syndrome of dialysis vascular access ([place]) 08/06/2021   Subclavian steal syndrome of right subclavian artery 06/25/2021   Right hand weakness 06/25/2021   Stroke-like symptoms 05/06/2021   Bandemia    Cerebrovascular accident (CVA) of right basal ganglia ([place]) 04/20/2021   External hemorrhoids    Basal ganglia infarction ([place]) 04/16/2021   ESRD on dialysis ([place]) 04/16/2021    Hypertensive urgency 03/10/2021   Hilar enlargement    Anemia of chronic disease    Chronic diastolic CHF (congestive heart failure) ([name]) 03/09/2021   CKD (chronic kidney disease), stage V ([place]) 03/08/2021   Diabetic retinopathy ([place]) 03/08/2021   Hyperglycemia due to type 2 diabetes mellitus ([place]) 03/08/2021   Pure hypercholesterolemia 03/08/2021   Anemia in chronic kidney disease 09/24/2020   Uncontrolled type 2 diabetes mellitus with hyperglycemia ([place]) 03/19/2019   Osteopenia 09/04/2018   Migraine 09/04/2018   Asthma 09/04/2018   Pseudophakia of both eyes 07/17/2018   Pseudophakia of left eye 07/03/2018   Open angle with borderline findings and high glaucoma risk in left eye 07/03/2018   Age-related nuclear cataract of right eye 07/03/2018   CAD (coronary artery disease), native coronary artery 04/27/2017   Abnormal nuclear stress test 04/04/2017   Dyspnea on exertion 03/09/2017   Appendicitis    Age-related hypermature cataract of both eyes 12/20/2016   Uterine leiomyoma 11/27/2012   Dyslipidemia (high LDL; low HDL) 11/25/2011   Obesity (BMI 30-39.9) 11/25/2011   Hypertensive disorder 11/21/2011   Type 2 diabetes mellitus ([place]) 11/21/2011   Abnormal EKG 11/21/2011    Ellasyn Swilling, OT 11/11/2021, 11:31 AM  [facility] [address] [place], [name], [address] Phone: [phone]   Fax:  [phone]  Name: NIGERIA LASSETER MRN: [id number] Date of Birth: 1950/10/26

## 2021-11-16 ENCOUNTER — Telehealth: Payer: Medicare Other

## 2021-11-16 ENCOUNTER — Ambulatory Visit: Payer: Medicare PPO | Admitting: Physical Therapy

## 2021-11-16 ENCOUNTER — Ambulatory Visit: Payer: Medicare PPO | Admitting: Occupational Therapy

## 2021-11-16 ENCOUNTER — Other Ambulatory Visit: Payer: Self-pay

## 2021-11-16 DIAGNOSIS — R2681 Unsteadiness on feet: Secondary | ICD-10-CM

## 2021-11-16 DIAGNOSIS — I69354 Hemiplegia and hemiparesis following cerebral infarction affecting left non-dominant side: Secondary | ICD-10-CM

## 2021-11-16 DIAGNOSIS — R2689 Other abnormalities of gait and mobility: Secondary | ICD-10-CM | POA: Diagnosis not present

## 2021-11-16 DIAGNOSIS — M6281 Muscle weakness (generalized): Secondary | ICD-10-CM

## 2021-11-16 NOTE — Therapy (Signed)
[facility] [address] [place], [name], [address] Phone: [phone]   Fax:  [phone]  Occupational Therapy Treatment  Patient Details  Name: Carolyn Russo MRN: [id number] Date of Birth: [birth year] Referring Provider (OT): Dr. Dagoberto Ligas   Encounter Date: 11/16/2021   OT End of Session - 11/16/21 1612     Visit Number 5    Number of Visits 25    Date for OT Re-Evaluation 01/13/22    Authorization Type Medicare, State BCBS    Authorization - Visit Number 5    Authorization - Number of Visits 10    Progress Note Due on Visit 10    OT Start Time 4854    OT Stop Time 1315    OT Time Calculation (min) 40 min    Activity Tolerance Patient tolerated treatment well    Behavior During Therapy [facility] for tasks assessed/performed             Past Medical History:  Diagnosis Date   Acute GI bleeding    Allergy    Anemia    Anterior chest wall pain    Appendicitis 1965   Asthma    Body mass index 37.0-37.9, adult    Breast pain    Cataract    both eyes   CHF (congestive heart failure) ([place])    Chronic kidney disease    stage 5 - on dialysis   Cognitive change 04/20/2021   r/t cva 03/2821   Dehydration 2014   Deviated septum 1971   Diabetes mellitus    no meds   Dyspnea 2014   Extrinsic asthma    WITH ASTHMA ATTACK   Fibroid 1980   GERD (gastroesophageal reflux disease)    Heart murmur    Hx gestational diabetes    Hyperlipidemia    Hypertension 2014   Inguinal hernia 1959   Malaise and fatigue 2014   Non-IgE mediated allergic asthma 2014   Obesity    Pelvic pain    Pregnancy, high-risk 1985   Stroke ([place]) 04/20/2021   (CVA) of right basal ganglia   Tonsillitis 1968   Uterine fibroid 1980    Past Surgical History:  Procedure Laterality Date   APPENDECTOMY  6270   [place] Right 04/30/2021   Procedure: RIGHT FIRST STAGE [place];  Surgeon: Angelia Mould, MD;  [facility] OR;  Service: Vascular;  Laterality: Right;   CESAREAN SECTION  1985   COLONOSCOPY     ESOPHAGOGASTRODUODENOSCOPY (EGD) WITH PROPOFOL N/A 04/18/2021   Procedure: ESOPHAGOGASTRODUODENOSCOPY (EGD) WITH PROPOFOL;  Surgeon: Gatha Mayer, MD;  Location: [facility] ENDOSCOPY;  Service: Endoscopy;  Laterality: N/A;   EYE SURGERY     bilateral cataract    FLEXIBLE SIGMOIDOSCOPY N/A 04/18/2021   Procedure: FLEXIBLE SIGMOIDOSCOPY;  Surgeon: Gatha Mayer, MD;  Location: [facility] ENDOSCOPY;  Service: Endoscopy;  Laterality: N/A;   HERNIA REPAIR  1959   IR FLUORO GUIDE CV LINE RIGHT  03/18/2021   IR US GUIDE VASC ACCESS RIGHT  03/18/2021   LEFT HEART CATH AND CORONARY ANGIOGRAPHY N/A 04/04/2017   Procedure: Left Heart Cath and Coronary Angiography;  Surgeon: Belva Crome, MD;  Location: [facility] CV LAB;  Service: Cardiovascular;  Laterality: N/A;   MYOMECTOMY  1980, 2004, 2007   RHINOPLASTY  1971   ROTATOR CUFF REPAIR  2003   SURGICAL REPAIR OF HEMORRHAGE  2015   TONSILLECTOMY  1968    There were no vitals  filed for this visit.   Subjective Assessment - 11/16/21 1610     Subjective  Pt arrived wearing resting hand splint today, education provided to pt, husband and caregiver regarding appropriate wear    Pertinent History Carolyn Russo is a 71 y.o. female with PMHx of obesity, DM, HTN, HLD, ESRD on HD who presented with left sided weakness for 3 days on 04/16/2021. Pt was diagnosed with R basal ganglia infarct Pt went to rehab but then transferred back to acute service on 7/13 for likely seizure. Pt returned to CIR and was d/c home on 05/27/21. Pt received HHOT and PT. Pt has R hand weakness s/p fistula placement. PMH: diabetes, hypertension, hyperlipidemia, ESRD on HD MWF.    Patient Stated Goals to be like I was prior to my stroke    Currently in Pain? No/denies                    Treatment: supine on mat, closed chin shoulder flexion, chest press and  horizontal abduction with foam roll between hands, min facilitation, mod v.c Pt was only able to perform 5 reps each prior to rest break. Sidelying to sit with min A and mod v.c Encouraged pt to participate more in ADLs as home.             OT Education - 11/16/21 1614     Education Details splint wear and care, precautions, pt was instructed only to wear 1-2 hours at a time during daytime at rest- therapist educated pt's hired caregiver as pt was trying to use walker with resting hand splint on. Caregiver was made aware that this is a safety issue and she should not wear resting hand splint while utilizing walker. Pt's husband arrived and was also educated regarding splint wear, care and precuations and closed chain shoulder flexion with paper towel roll.    Person(s) Educated Patient;Caregiver(s);Spouse    Methods Explanation;Demonstration;Verbal cues;Handout    Comprehension Verbalized understanding;Returned demonstration;Verbal cues required              OT Short Term Goals - 11/09/21 1111       OT SHORT TERM GOAL #1   Title I with initial HEP    Time 4    Period Weeks    Status New    Target Date 11/18/21      OT SHORT TERM GOAL #2   Title Pt will donn shirt  and bathe UB with min A consistently    Time 4    Period Weeks    Status New      OT SHORT TERM GOAL #3   Title Pt will be I with bilateral UE positioning including splinting  to minimize pain and risk for contracture and injury.    Time 4    Period Weeks    Status New      OT SHORT TERM GOAL #4   Title Pt will demonstrate 60* LUE shoulder flexion in prep for functional reach    Time 4    Period Weeks    Status New      OT SHORT TERM GOAL #5   Title Pt will demonstrate improved bilateral UE functional use as eveidenced by increasing bilateral box/ blocks score by 3 blocks.    Time 4    Period Weeks    Status New               OT Long Term Goals - 10/21/21 1350  OT LONG TERM GOAL  #1   Title Pt will perform UB dressing with setup and LB dressing with min A.    Time 12    Period Weeks    Status New      OT LONG TERM GOAL #2   Title Pt will bathe herself with min A.    Time 12    Period Weeks    Status New      OT LONG TERM GOAL #3   Title Pt will perform simple home management/ cooking tasks with min A.    Time 12    Period Weeks    Status New      OT LONG TERM GOAL #4   Title Pt will retrieve a lightweight object at 75* shoulder flexion with LUE.    Time 12    Period Weeks    Status New      OT LONG TERM GOAL #5   Title Pt will increase RUE grip strength to 15 lbs for increased functional use.    Time 12    Period Weeks    Status New      OT LONG TERM GOAL #6   Title Pt will demonstrate ability to write her name and address with 100% legibility using AE prn.    Time 12    Period Weeks    Status New      OT LONG TERM GOAL #7   Title Pt will utilize RUE at least 75% of the time for ADLS/ IADLS with pain no greater than 4/10.    Time 12    Period Weeks    Status New                   Plan - 11/16/21 1612     Clinical Impression Statement Pt's husband was arrived today during pt's session. Therapist educated pt's husband in splint wear schedule and application.    OT Occupational Profile and History Detailed Assessment- Review of Records and additional review of physical, cognitive, psychosocial history related to current functional performance    Occupational performance deficits (Please refer to evaluation for details): ADL's;IADL's;Play;Social Participation;Rest and Sleep    Body Structure / Function / Physical Skills ADL;Endurance;UE functional use;Balance;Flexibility;Pain;FMC;ROM;Gait;Coordination;GMC;Sensation;Decreased knowledge of precautions;Decreased knowledge of use of DME;IADL;Dexterity;Strength;Mobility    Rehab Potential Good    OT Frequency 2x / week    OT Duration 12 weeks    OT Treatment/Interventions Self-care/ADL  training;Ultrasound;DME and/or AE instruction;Patient/family education;Balance training;Passive range of motion;Paraffin;Cryotherapy;Fluidtherapy;Splinting;Functional Mobility Training;Electrical Stimulation;Moist Heat;Therapeutic exercise;Manual Therapy;Therapeutic activities;Neuromuscular education    Plan NMR, ADL strategies donning shirt/ jacket ,functional use of UE's    Consulted and Agree with Plan of Care Patient;Other (Comment)   hired caregiver briefly, pt's husband            Patient will benefit from skilled therapeutic intervention in order to improve the following deficits and impairments:   Body Structure / Function / Physical Skills: ADL, Endurance, UE functional use, Balance, Flexibility, Pain, FMC, ROM, Gait, Coordination, GMC, Sensation, Decreased knowledge of precautions, Decreased knowledge of use of DME, IADL, Dexterity, Strength, Mobility       Visit Diagnosis: Hemiplegia and hemiparesis following cerebral infarction affecting left non-dominant side (HCC)  Muscle weakness (generalized)    Problem List Patient Active Problem List   Diagnosis Date Noted   Left hemiparesis ([place]) 09/24/2021   Abnormality of gait 09/24/2021   Pain of right hand 08/06/2021   Steal syndrome of dialysis vascular access ([place])  08/06/2021   Subclavian steal syndrome of right subclavian artery 06/25/2021   Right hand weakness 06/25/2021   Stroke-like symptoms 05/06/2021   Bandemia    Cerebrovascular accident (CVA) of right basal ganglia ([place]) 04/20/2021   External hemorrhoids    Basal ganglia infarction ([place]) 04/16/2021   ESRD on dialysis ([place]) 04/16/2021   Hypertensive urgency 03/10/2021   Hilar enlargement    Anemia of chronic disease    Chronic diastolic CHF (congestive heart failure) ([place]) 03/09/2021   CKD (chronic kidney disease), stage V ([place]) 03/08/2021   Diabetic retinopathy ([place]) 03/08/2021   Hyperglycemia due to type 2 diabetes mellitus ([place]) 03/08/2021   Pure  hypercholesterolemia 03/08/2021   Anemia in chronic kidney disease 09/24/2020   Uncontrolled type 2 diabetes mellitus with hyperglycemia ([place]) 03/19/2019   Osteopenia 09/04/2018   Migraine 09/04/2018   Asthma 09/04/2018   Pseudophakia of both eyes 07/17/2018   Pseudophakia of left eye 07/03/2018   Open angle with borderline findings and high glaucoma risk in left eye 07/03/2018   Age-related nuclear cataract of right eye 07/03/2018   CAD (coronary artery disease), native coronary artery 04/27/2017   Abnormal nuclear stress test 04/04/2017   Dyspnea on exertion 03/09/2017   Appendicitis    Age-related hypermature cataract of both eyes 12/20/2016   Uterine leiomyoma 11/27/2012   Dyslipidemia (high LDL; low HDL) 11/25/2011   Obesity (BMI 30-39.9) 11/25/2011   Hypertensive disorder 11/21/2011   Type 2 diabetes mellitus ([place]) 11/21/2011   Abnormal EKG 11/21/2011    Dolora Ridgely, OT 11/16/2021, 4:16 PM  [facility] [address] [place], [name], [address] Phone: [phone]   Fax:  [phone]  Name: Carolyn Russo MRN: [id number] Date of Birth: May 18, 1951

## 2021-11-16 NOTE — Patient Instructions (Signed)
Wear your resting hand splint at rest periods only, for 1-2 hours at a time This is the splint that includes your fingers Check your skin for pressure areas or redness when splint is removed, if any problems stop wearing splint, if no problems progress to wearing splint at night  Do not wear splint when walking with walker         Shoulder: Flexion (Supine)    With hands shoulder width apart, slowly lower dowel to floor behind head. Hold paper towel roll between your hands Do not let elbows bend. Keep back flat. Hold ___5_ seconds. Repeat _10___ times. Do _2___ sessions per day.    CAUTION: Stretch slowly and gently.  Copyright  VHI. All rights reserved.

## 2021-11-17 NOTE — Therapy (Signed)
[facility] [address] [place], [name], [address] Phone: [phone]   Fax:  [phone]  Physical Therapy Treatment  Patient Details  Name: Carolyn Russo MRN: [id number] Date of Birth: Apr 08, 1951 Referring Provider (PT): Courtney Heys, MD   Encounter Date: 11/16/2021   PT End of Session - 11/17/21 1201     Visit Number 5    Number of Visits 25    Date for PT Re-Evaluation 01/14/22    Authorization Type [employer] [place] submitted    PT Start Time 1200   pt arrived 17" late for appt   PT Stop Time 1230    PT Time Calculation (min) 30 min    Equipment Utilized During Treatment Gait belt    Activity Tolerance Patient limited by fatigue;Patient tolerated treatment well    Behavior During Therapy [facility] for tasks assessed/performed             Past Medical History:  Diagnosis Date   Acute GI bleeding    Allergy    Anemia    Anterior chest wall pain    Appendicitis 1965   Asthma    Body mass index 37.0-37.9, adult    Breast pain    Cataract    both eyes   CHF (congestive heart failure) ([place])    Chronic kidney disease    stage 5 - on dialysis   Cognitive change 04/20/2021   r/t cva 03/2821   Dehydration 2014   Deviated septum 1971   Diabetes mellitus    no meds   Dyspnea 2014   Extrinsic asthma    WITH ASTHMA ATTACK   Fibroid 1980   GERD (gastroesophageal reflux disease)    Heart murmur    Hx gestational diabetes    Hyperlipidemia    Hypertension 2014   Inguinal hernia 1959   Malaise and fatigue 2014   Non-IgE mediated allergic asthma 2014   Obesity    Pelvic pain    Pregnancy, high-risk 1985   Stroke ([place]) 04/20/2021   (CVA) of right basal ganglia   Tonsillitis 1968   Uterine fibroid 1980    Past Surgical History:  Procedure Laterality Date   APPENDECTOMY  9379   BASCILIC VEIN TRANSPOSITION Right 04/30/2021   Procedure: RIGHT FIRST STAGE [place];  Surgeon:  Angelia Mould, MD;  Location: [facility] OR;  Service: Vascular;  Laterality: Right;   CESAREAN SECTION  1985   COLONOSCOPY     ESOPHAGOGASTRODUODENOSCOPY (EGD) WITH PROPOFOL N/A 04/18/2021   Procedure: ESOPHAGOGASTRODUODENOSCOPY (EGD) WITH PROPOFOL;  Surgeon: Gatha Mayer, MD;  Location: [facility] ENDOSCOPY;  Service: Endoscopy;  Laterality: N/A;   EYE SURGERY     bilateral cataract    FLEXIBLE SIGMOIDOSCOPY N/A 04/18/2021   Procedure: FLEXIBLE SIGMOIDOSCOPY;  Surgeon: Gatha Mayer, MD;  Location: [facility] ENDOSCOPY;  Service: Endoscopy;  Laterality: N/A;   HERNIA REPAIR  1959   IR FLUORO GUIDE CV LINE RIGHT  03/18/2021   IR US GUIDE VASC ACCESS RIGHT  03/18/2021   LEFT HEART CATH AND CORONARY ANGIOGRAPHY N/A 04/04/2017   Procedure: Left Heart Cath and Coronary Angiography;  Surgeon: Belva Crome, MD;  Location: [place] CV LAB;  Service: Cardiovascular;  Laterality: N/A;   MYOMECTOMY  1980, 2004, 2007   RHINOPLASTY  1971   ROTATOR CUFF REPAIR  2003   SURGICAL REPAIR OF HEMORRHAGE  2015   TONSILLECTOMY  1968    There were no vitals filed for this visit.  Subjective Assessment - 11/16/21 1201     Subjective Pt arrives 15" late for appt - her caregiver does not accompany her to the gym; pt reports that her hand will not stay in the splint and states it is not comfortable - I informed her we would discuss with OT, Theone Murdoch, whom she has after PT appt today    Pertinent History ESRD with HD M-W-Fri:, obesity, DM type 2, CAD, iron deficiency anemia, bronchial asthma, chronic diastolic CHF, Rt basal ganglia CVA on 04-16-21, steal syndrome RUE due to dialysis vascular access (s/p fistula placement), CKD stage V:  Rt hand weakness; Lt hemiparesis due to CVA    Patient Stated Goals "be independent with mobility and self care"; "be able to drive again"    Currently in Pain? No/denies    Pain Onset More than a month ago                               [facility] Adult PT  Treatment/Exercise - 11/17/21 0001       Transfers   Transfers Sit to Stand;Stand to Sit    Sit to Stand 5: Supervision    Stand to Sit 5: Supervision    Comments bil. UE support used with transfer from mat to RW      Ambulation/Gait   Ambulation/Gait Yes    Ambulation/Gait Assistance 5: Supervision    Ambulation Distance (Feet) 160 Feet   40' x 1, 60' x 2 from mat to // bars, back to mat   Assistive device Rolling walker    Gait Pattern Step-through pattern;Decreased hip/knee flexion - left;Decreased step length - left    Ambulation Surface Level;Indoor    Gait Comments Pt also gait trained inside // bars with LUE support 10' x 2 reps:  10' x 1 without any UE support                 Balance Exercises - 11/17/21 0001       Balance Exercises: Standing   Sidestepping 2 reps   10' x 2 inside // bars   Other Standing Exercises tap ups with each foot 5 reps each to 4" step inside // bars with bil. UE support on // bars with CGA    Other Standing Exercises Comments pt stepped up onto 4" step, then down to floor 1 rep with UE support on // bars; performed alternating forward kicks 5 reps each leg and then side kicks 5 reps each leg with UE support on // bars with CGA                  PT Short Term Goals - 11/17/21 1149       PT SHORT TERM GOAL #1   Title Improve TUG score from 32 secs to </= 27 secs with RW with SBA to demo improved functional mobility. (all STGs due 11/19/21)    Baseline 32 secs    Time 4    Period Weeks    Status On-going    Target Date 11/19/21      PT SHORT TERM GOAL #2   Title Pt will perform 5 times sit to stand transfers from standard chair with only 1 UE support with SBA to demo increased LE strength and balance.    Baseline Bil. UE support with CGA to min asssit needed for sit to stand - 10-21-21    Time 4    Period  Weeks    Status On-going    Target Date 11/19/21      PT SHORT TERM GOAL #3   Title Increase Berg balance test score  from 13/30 to >/= 19/30 to demo improved balance.    Baseline 13/30    Time 4    Period Weeks    Status On-going    Target Date 11/19/21      PT SHORT TERM GOAL #4   Title Increase gait velocity by at least .4 ft/sec with RW to demo improved gait efficiency.    Baseline 11/02/21: 1.17 ft/sec with RW as baselne    Time 4    Period Weeks    Status On-going    Target Date 11/19/21      PT SHORT TERM GOAL #5   Title Amb. 72' with RW with SBA on flat, even surface for increased community accessibility.    Baseline 64' with RW due to fatigue    Time 4    Period Weeks    Status On-going    Target Date 11/19/21      PT SHORT TERM GOAL #6   Title Independent with HEP for strengthening & balance exs.    Time 4    Period Weeks    Status On-going    Target Date 11/19/21               PT Long Term Goals - 11/17/21 1206       PT LONG TERM GOAL #1   Title Improve TUG score to </= 20 secs with RW with supervision to reduce fall risk.    Baseline 32 secs with RW    Time 12    Period Weeks    Status New    Target Date 01/14/22      PT LONG TERM GOAL #2   Title Pt will amb. 500' with RW with SBA on flat, even surface for increased community accessibility.    Baseline 42' with RW    Time 12    Period Weeks    Status New    Target Date 01/14/22      PT LONG TERM GOAL #3   Title Increase Berg balance test score from 13/56 to >/= 28/56 to reduce fall risk.    Time 12    Period Weeks    Status New    Target Date 01/14/22      PT LONG TERM GOAL #4   Title Increase gait velocity by at least 1.0 ft/sec with RW for incr. gait efficiency.    Time 12    Period Weeks    Status New    Target Date 01/14/22      PT LONG TERM GOAL #5   Title Perform sit to stand from chair without UE support to demo improved LE strength and balance.    Time 12    Period Weeks    Status New    Target Date 01/14/22      PT LONG TERM GOAL #6   Title Negotiate 4 steps with 1 hand rail using  step by step sequence with SBA.    Time 12    Period Weeks    Status New    Target Date 01/14/22      PT LONG TERM GOAL #7   Title Independent in updated HEP for balance & strengthening.    Time 12    Period Weeks    Status New    Target Date 01/14/22  Plan - 11/17/21 1203     Clinical Impression Statement Treatment was limited due to pt arriving 15" late for scheduled appt.  Session focused on gait training inside // bars with decreased UE support and on balance exercises with minimal UE support.  Pt fatigues quickly and requires frequent short seated rest breaks.  Pt was able to amb. 10' inside // bars without UE support without LOB but appeared anxious and fearful of falling.  Cont with POC.    Personal Factors and Comorbidities Fitness;Comorbidity 2;Behavior Pattern    Comorbidities CAD, ESRD on HD M-W-Fri, CKD stage V, iron deficiency anemia, DM type 2, bronchial asthma, chronic diastolic CHF, s/p Rt basal ganglia CVA, steal syndrome RUE with fistula implant    Examination-Activity Limitations Bathing;Bed Mobility;Locomotion Level;Transfers;Reach Overhead;Squat;Stairs;Stand;Toileting;Hygiene/Grooming;Bend;Carry    Examination-Participation Restrictions Cleaning;Community Activity;Driving;Interpersonal Relationship;Laundry;Meal Prep;Shop    Stability/Clinical Decision Making Evolving/Moderate complexity    Rehab Potential Good    PT Frequency 2x / week    PT Duration 12 weeks    PT Treatment/Interventions ADLs/Self Care Home Management;DME Instruction;Gait training;Stair training;Therapeutic activities;Therapeutic exercise;Balance training;Neuromuscular re-education;Patient/family education    PT Next Visit Plan Please start checking STG's - i will finish checking when I see her next week - continue with use of scifit/nustep for strengthening/activity tolerance; continue to address strengthening and balance;    PT Home Exercise Plan Access Code: [email or web address]     Consulted and Agree with Plan of Care Patient   CNA - Ms. Sherrod            Patient will benefit from skilled therapeutic intervention in order to improve the following deficits and impairments:  Abnormal gait, Decreased activity tolerance, Decreased balance, Decreased strength, Decreased endurance, Decreased range of motion, Impaired sensation, Impaired UE functional use  Visit Diagnosis: Hemiplegia and hemiparesis following cerebral infarction affecting left non-dominant side (HCC)  Other abnormalities of gait and mobility  Unsteadiness on feet     Problem List Patient Active Problem List   Diagnosis Date Noted   Left hemiparesis ([place]) 09/24/2021   Abnormality of gait 09/24/2021   Pain of right hand 08/06/2021   Steal syndrome of dialysis vascular access ([place]) 08/06/2021   Subclavian steal syndrome of right subclavian artery 06/25/2021   Right hand weakness 06/25/2021   Stroke-like symptoms 05/06/2021   Bandemia    Cerebrovascular accident (CVA) of right basal ganglia ([place]) 04/20/2021   External hemorrhoids    Basal ganglia infarction ([place]) 04/16/2021   ESRD on dialysis ([place]) 04/16/2021   Hypertensive urgency 03/10/2021   Hilar enlargement    Anemia of chronic disease    Chronic diastolic CHF (congestive heart failure) ([place]) 03/09/2021   CKD (chronic kidney disease), stage V ([place]) 03/08/2021   Diabetic retinopathy ([place]) 03/08/2021   Hyperglycemia due to type 2 diabetes mellitus ([place]) 03/08/2021   Pure hypercholesterolemia 03/08/2021   Anemia in chronic kidney disease 09/24/2020   Uncontrolled type 2 diabetes mellitus with hyperglycemia ([place]) 03/19/2019   Osteopenia 09/04/2018   Migraine 09/04/2018   Asthma 09/04/2018   Pseudophakia of both eyes 07/17/2018   Pseudophakia of left eye 07/03/2018   Open angle with borderline findings and high glaucoma risk in left eye 07/03/2018   Age-related nuclear cataract of right eye 07/03/2018   CAD (coronary artery disease),  native coronary artery 04/27/2017   Abnormal nuclear stress test 04/04/2017   Dyspnea on exertion 03/09/2017   Appendicitis    Age-related hypermature cataract of both eyes 12/20/2016   Uterine leiomyoma 11/27/2012   Dyslipidemia (  high LDL; low HDL) 11/25/2011   Obesity (BMI 30-39.9) 11/25/2011   Hypertensive disorder 11/21/2011   Type 2 diabetes mellitus ([place]) 11/21/2011   Abnormal EKG 11/21/2011    [place], PT 11/17/2021, 12:14 PM  [facility] [address] [place], [name], [address] Phone: [phone]   Fax:  [phone]  Name: Carolyn Russo MRN: [id number] Date of Birth: 1951/05/14

## 2021-11-18 ENCOUNTER — Ambulatory Visit: Payer: Medicare PPO | Admitting: Occupational Therapy

## 2021-11-18 ENCOUNTER — Ambulatory Visit: Payer: Medicare PPO | Admitting: Physical Therapy

## 2021-11-18 ENCOUNTER — Other Ambulatory Visit: Payer: Self-pay

## 2021-11-18 ENCOUNTER — Encounter: Payer: Self-pay | Admitting: Occupational Therapy

## 2021-11-18 ENCOUNTER — Encounter: Payer: Self-pay | Admitting: Physical Therapy

## 2021-11-18 VITALS — BP 183/82 | HR 76

## 2021-11-18 DIAGNOSIS — R2681 Unsteadiness on feet: Secondary | ICD-10-CM

## 2021-11-18 DIAGNOSIS — R2689 Other abnormalities of gait and mobility: Secondary | ICD-10-CM

## 2021-11-18 DIAGNOSIS — I69354 Hemiplegia and hemiparesis following cerebral infarction affecting left non-dominant side: Secondary | ICD-10-CM

## 2021-11-18 DIAGNOSIS — M6281 Muscle weakness (generalized): Secondary | ICD-10-CM

## 2021-11-18 DIAGNOSIS — R208 Other disturbances of skin sensation: Secondary | ICD-10-CM

## 2021-11-18 DIAGNOSIS — M25512 Pain in left shoulder: Secondary | ICD-10-CM

## 2021-11-18 DIAGNOSIS — M79641 Pain in right hand: Secondary | ICD-10-CM

## 2021-11-18 DIAGNOSIS — R278 Other lack of coordination: Secondary | ICD-10-CM

## 2021-11-18 NOTE — Therapy (Signed)
[facility] [address] [place], [name], [address] Phone: [phone]   Fax:  [phone]  Occupational Therapy Treatment  Patient Details  Name: Carolyn Russo MRN: [id number] Date of Birth: 19-Jul-1951 Referring Provider (OT): Dr. Dagoberto Ligas   Encounter Date: 11/18/2021   OT End of Session - 11/18/21 0943     Visit Number 6    Number of Visits 25    Date for OT Re-Evaluation 01/13/22    Authorization Type Medicare, State [employer]BS    Authorization - Visit Number 6    Authorization - Number of Visits 10    Progress Note Due on Visit 10    OT Start Time 0935    OT Stop Time 8416    OT Time Calculation (min) 40 min    Activity Tolerance Patient tolerated treatment well    Behavior During Therapy [facility] for tasks assessed/performed             Past Medical History:  Diagnosis Date   Acute GI bleeding    Allergy    Anemia    Anterior chest wall pain    Appendicitis 1965   Asthma    Body mass index 37.0-37.9, adult    Breast pain    Cataract    both eyes   CHF (congestive heart failure) ([place])    Chronic kidney disease    stage 5 - on dialysis   Cognitive change 04/20/2021   r/t cva 03/2821   Dehydration 2014   Deviated septum 1971   Diabetes mellitus    no meds   Dyspnea 2014   Extrinsic asthma    WITH ASTHMA ATTACK   Fibroid 1980   GERD (gastroesophageal reflux disease)    Heart murmur    Hx gestational diabetes    Hyperlipidemia    Hypertension 2014   Inguinal hernia 1959   Malaise and fatigue 2014   Non-IgE mediated allergic asthma 2014   Obesity    Pelvic pain    Pregnancy, high-risk 1985   Stroke ([place]) 04/20/2021   (CVA) of right basal ganglia   Tonsillitis 1968   Uterine fibroid 1980    Past Surgical History:  Procedure Laterality Date   APPENDECTOMY  6063   [place] Right 04/30/2021   Procedure: RIGHT FIRST STAGE [place];  Surgeon: Angelia Mould, MD;  Location: [facility] OR;  Service: Vascular;  Laterality: Right;   CESAREAN SECTION  1985   COLONOSCOPY     ESOPHAGOGASTRODUODENOSCOPY (EGD) WITH PROPOFOL N/A 04/18/2021   Procedure: ESOPHAGOGASTRODUODENOSCOPY (EGD) WITH PROPOFOL;  Surgeon: Gatha Mayer, MD;  Location: [facility] ENDOSCOPY;  Service: Endoscopy;  Laterality: N/A;   EYE SURGERY     bilateral cataract    FLEXIBLE SIGMOIDOSCOPY N/A 04/18/2021   Procedure: FLEXIBLE SIGMOIDOSCOPY;  Surgeon: Gatha Mayer, MD;  Location: [facility] ENDOSCOPY;  Service: Endoscopy;  Laterality: N/A;   HERNIA REPAIR  1959   IR FLUORO GUIDE CV LINE RIGHT  03/18/2021   IR US GUIDE VASC ACCESS RIGHT  03/18/2021   LEFT HEART CATH AND CORONARY ANGIOGRAPHY N/A 04/04/2017   Procedure: Left Heart Cath and Coronary Angiography;  Surgeon: Belva Crome, MD;  Location: [facility] CV LAB;  Service: Cardiovascular;  Laterality: N/A;   MYOMECTOMY  1980, 2004, 2007   RHINOPLASTY  1971   ROTATOR CUFF REPAIR  2003   SURGICAL REPAIR OF HEMORRHAGE  2015   TONSILLECTOMY  1968    There were no vitals  filed for this visit.   Subjective Assessment - 11/18/21 0935     Subjective  Pt reports a lot of sweating with resting hand splint.  Pt reports no pain initially, except some discomfort L shoulder intermittently with shoulder ROM (improved with positioning) and nerve pain R hand intermittently.    Pertinent History [name] is a 71 y.o. female with PMHx of obesity, DM, HTN, HLD, ESRD on HD who presented with left sided weakness for 3 days on 04/16/2021. Pt was diagnosed with R basal ganglia infarct Pt went to rehab but then transferred back to acute service on 7/13 for likely seizure. Pt returned to CIR and was d/c home on 05/27/21. Pt received HHOT and PT. Pt has R hand weakness s/p fistula placement. PMH: diabetes, hypertension, hyperlipidemia, ESRD on HD MWF.    Patient Stated Goals to be like I was prior to my stroke    Currently in Pain? No/denies              Supine, closed-chain shoulder flexion, chest press, and horizontal abduction with min facilitation at LUE for positioning and min cueing.    Supine>sitting with min A/cueing.  Sitting, functional reaching to remove cylinder pegs with each UE with min cueing for full finger ext with R hand (wearing anti-claw splint) and mod difficulty and no significant difficulty with RUE (reaching laterally and at mid-range).    Sitting, flipping large cards with R hand with anti-claw splint with min cueing/mod difficulty for full finger ext prior to grasp and cueing for midline postural alignment.  Sitting, table slides L shoulder for shoulder flex/elbow and horizontal abduction with no pain (only stretch/discomfort) with min cueing.     Doffed jacket with min-mod cueing/prompts/encouragement and min A.    Pt/caregiver instructed in use of stockinette for sweating to wear under resting hand splint (issued 3 to alternate washing).       OT Short Term Goals - 11/09/21 1111       OT SHORT TERM GOAL #1   Title I with initial HEP    Time 4    Period Weeks    Status New    Target Date 11/18/21      OT SHORT TERM GOAL #2   Title Pt will donn shirt  and bathe UB with min A consistently    Time 4    Period Weeks    Status New      OT SHORT TERM GOAL #3   Title Pt will be I with bilateral UE positioning including splinting  to minimize pain and risk for contracture and injury.    Time 4    Period Weeks    Status New      OT SHORT TERM GOAL #4   Title Pt will demonstrate 60* LUE shoulder flexion in prep for functional reach    Time 4    Period Weeks    Status New      OT SHORT TERM GOAL #5   Title Pt will demonstrate improved bilateral UE functional use as eveidenced by increasing bilateral box/ blocks score by 3 blocks.    Time 4    Period Weeks    Status New               OT Long Term Goals - 10/21/21 1350       OT LONG TERM GOAL #1   Title Pt will perform UB dressing with  setup and LB dressing with min A.  Time 12    Period Weeks    Status New      OT LONG TERM GOAL #2   Title Pt will bathe herself with min A.    Time 12    Period Weeks    Status New      OT LONG TERM GOAL #3   Title Pt will perform simple home management/ cooking tasks with min A.    Time 12    Period Weeks    Status New      OT LONG TERM GOAL #4   Title Pt will retrieve a lightweight object at 75* shoulder flexion with LUE.    Time 12    Period Weeks    Status New      OT LONG TERM GOAL #5   Title Pt will increase RUE grip strength to 15 lbs for increased functional use.    Time 12    Period Weeks    Status New      OT LONG TERM GOAL #6   Title Pt will demonstrate ability to write her name and address with 100% legibility using AE prn.    Time 12    Period Weeks    Status New      OT LONG TERM GOAL #7   Title Pt will utilize RUE at least 75% of the time for ADLS/ IADLS with pain no greater than 4/10.    Time 12    Period Weeks    Status New                   Plan - 11/18/21 1005     Clinical Impression Statement Pt is progressing towards goals with improving UE functional use.  Pt participated well today.    OT Occupational Profile and History Detailed Assessment- Review of Records and additional review of physical, cognitive, psychosocial history related to current functional performance    Occupational performance deficits (Please refer to evaluation for details): ADL's;IADL's;Play;Social Participation;Rest and Sleep    Body Structure / Function / Physical Skills ADL;Endurance;UE functional use;Balance;Flexibility;Pain;FMC;ROM;Gait;Coordination;GMC;Sensation;Decreased knowledge of precautions;Decreased knowledge of use of DME;IADL;Dexterity;Strength;Mobility    Rehab Potential Good    OT Frequency 2x / week    OT Duration 12 weeks    OT Treatment/Interventions Self-care/ADL training;Ultrasound;DME and/or AE instruction;Patient/family education;Balance  training;Passive range of motion;Paraffin;Cryotherapy;Fluidtherapy;Splinting;Functional Mobility Training;Electrical Stimulation;Moist Heat;Therapeutic exercise;Manual Therapy;Therapeutic activities;Neuromuscular education    Plan NMR, ADL strategies donning shirt/ jacket, functional use of UE's    Consulted and Agree with Plan of Care Patient;Other (Comment)   hired caregiver briefly, pt's husband            Patient will benefit from skilled therapeutic intervention in order to improve the following deficits and impairments:   Body Structure / Function / Physical Skills: ADL, Endurance, UE functional use, Balance, Flexibility, Pain, FMC, ROM, Gait, Coordination, GMC, Sensation, Decreased knowledge of precautions, Decreased knowledge of use of DME, IADL, Dexterity, Strength, Mobility       Visit Diagnosis: Hemiplegia and hemiparesis following cerebral infarction affecting left non-dominant side (HCC)  Muscle weakness (generalized)  Other lack of coordination  Unsteadiness on feet  Acute pain of left shoulder  Pain in right hand  Other disturbances of skin sensation    Problem List Patient Active Problem List   Diagnosis Date Noted   Left hemiparesis ([place]) 09/24/2021   Abnormality of gait 09/24/2021   Pain of right hand 08/06/2021   Steal syndrome of dialysis vascular access ([place]) 08/06/2021  Subclavian steal syndrome of right subclavian artery 06/25/2021   Right hand weakness 06/25/2021   Stroke-like symptoms 05/06/2021   Bandemia    Cerebrovascular accident (CVA) of right basal ganglia ([place]) 04/20/2021   External hemorrhoids    Basal ganglia infarction ([place]) 04/16/2021   ESRD on dialysis ([place]) 04/16/2021   Hypertensive urgency 03/10/2021   Hilar enlargement    Anemia of chronic disease    Chronic diastolic CHF (congestive heart failure) ([place]) 03/09/2021   CKD (chronic kidney disease), stage V ([place]) 03/08/2021   Diabetic retinopathy ([place]) 03/08/2021    Hyperglycemia due to type 2 diabetes mellitus ([place]) 03/08/2021   Pure hypercholesterolemia 03/08/2021   Anemia in chronic kidney disease 09/24/2020   Uncontrolled type 2 diabetes mellitus with hyperglycemia ([place]) 03/19/2019   Osteopenia 09/04/2018   Migraine 09/04/2018   Asthma 09/04/2018   Pseudophakia of both eyes 07/17/2018   Pseudophakia of left eye 07/03/2018   Open angle with borderline findings and high glaucoma risk in left eye 07/03/2018   Age-related nuclear cataract of right eye 07/03/2018   CAD (coronary artery disease), native coronary artery 04/27/2017   Abnormal nuclear stress test 04/04/2017   Dyspnea on exertion 03/09/2017   Appendicitis    Age-related hypermature cataract of both eyes 12/20/2016   Uterine leiomyoma 11/27/2012   Dyslipidemia (high LDL; low HDL) 11/25/2011   Obesity (BMI 30-39.9) 11/25/2011   Hypertensive disorder 11/21/2011   Type 2 diabetes mellitus ([place]) 11/21/2011   Abnormal EKG 11/21/2011    Vianne Bulls, OT 11/18/2021, 10:35 AM  [facility] [address] [place], [name], [address] Phone: [phone]   Fax:  [phone]  Name: HAIZLEY CANNELLA MRN: [id number] Date of Birth: 11-Apr-1951   Vianne Bulls, OTR/L [facility] [address] [place], [place]  [address] [phone] phone [phone] 11/18/21 10:35 AM

## 2021-11-18 NOTE — Therapy (Signed)
[facility] [address] [place], [name], [address] Phone: [phone]   Fax:  [phone]  Physical Therapy Treatment  Patient Details  Name: Carolyn Russo MRN: [id number] Date of Birth: 1951-04-06 Referring Provider (PT): Courtney Heys, MD   Encounter Date: 11/18/2021   PT End of Session - 11/18/21 1020     Visit Number 6    Number of Visits 25    Date for PT Re-Evaluation 01/14/22    Authorization Type [employer] [place] submitted    PT Start Time 1017    PT Stop Time 1058    PT Time Calculation (min) 41 min    Equipment Utilized During Treatment Gait belt    Activity Tolerance Patient limited by fatigue;Patient tolerated treatment well    Behavior During Therapy WFL for tasks assessed/performed             Past Medical History:  Diagnosis Date   Acute GI bleeding    Allergy    Anemia    Anterior chest wall pain    Appendicitis 1965   Asthma    Body mass index 37.0-37.9, adult    Breast pain    Cataract    both eyes   CHF (congestive heart failure) ([place])    Chronic kidney disease    stage 5 - on dialysis   Cognitive change 04/20/2021   r/t cva 03/2821   Dehydration 2014   Deviated septum 1971   Diabetes mellitus    no meds   Dyspnea 2014   Extrinsic asthma    WITH ASTHMA ATTACK   Fibroid 1980   GERD (gastroesophageal reflux disease)    Heart murmur    Hx gestational diabetes    Hyperlipidemia    Hypertension 2014   Inguinal hernia 1959   Malaise and fatigue 2014   Non-IgE mediated allergic asthma 2014   Obesity    Pelvic pain    Pregnancy, high-risk 1985   Stroke ([place]) 04/20/2021   (CVA) of right basal ganglia   Tonsillitis 1968   Uterine fibroid 1980    Past Surgical History:  Procedure Laterality Date   APPENDECTOMY  0034   BASCILIC VEIN TRANSPOSITION Right 04/30/2021   Procedure: RIGHT FIRST STAGE [place];  Surgeon: Angelia Mould, MD;   Location: [facility] OR;  Service: Vascular;  Laterality: Right;   CESAREAN SECTION  1985   COLONOSCOPY     ESOPHAGOGASTRODUODENOSCOPY (EGD) WITH PROPOFOL N/A 04/18/2021   Procedure: ESOPHAGOGASTRODUODENOSCOPY (EGD) WITH PROPOFOL;  Surgeon: Gatha Mayer, MD;  Location: [facility] ENDOSCOPY;  Service: Endoscopy;  Laterality: N/A;   EYE SURGERY     bilateral cataract    FLEXIBLE SIGMOIDOSCOPY N/A 04/18/2021   Procedure: FLEXIBLE SIGMOIDOSCOPY;  Surgeon: Gatha Mayer, MD;  Location: [facility] ENDOSCOPY;  Service: Endoscopy;  Laterality: N/A;   HERNIA REPAIR  1959   IR FLUORO GUIDE CV LINE RIGHT  03/18/2021   IR US GUIDE VASC ACCESS RIGHT  03/18/2021   LEFT HEART CATH AND CORONARY ANGIOGRAPHY N/A 04/04/2017   Procedure: Left Heart Cath and Coronary Angiography;  Surgeon: Belva Crome, MD;  Location: [facility] CV LAB;  Service: Cardiovascular;  Laterality: N/A;   MYOMECTOMY  1980, 2004, 2007   RHINOPLASTY  1971   ROTATOR CUFF REPAIR  2003   SURGICAL REPAIR OF HEMORRHAGE  2015   TONSILLECTOMY  1968    Vitals:   11/18/21 1018 11/18/21 1052  BP: (!) 166/93 (!) 183/82  Pulse: 75 76  Subjective Assessment - 11/18/21 1018     Subjective No new complaints. No falls. Continues to have pain in right hand/wrist with weight bearing.    Pertinent History ESRD with HD M-W-Fri:, obesity, DM type 2, CAD, iron deficiency anemia, bronchial asthma, chronic diastolic CHF, Rt basal ganglia CVA on 04-16-21, steal syndrome RUE due to dialysis vascular access (s/p fistula placement), CKD stage V:  Rt hand weakness; Lt hemiparesis due to CVA    Patient Stated Goals "be independent with mobility and self care"; "be able to drive again"    Currently in Pain? Yes    Pain Score 6    with weight bearing through UE   Pain Location Hand    Pain Orientation Right    Pain Descriptors / Indicators Aching    Pain Type Acute pain;Chronic pain    Pain Onset More than a month ago    Pain Frequency Intermittent    Aggravating Factors   weight bearing    Pain Relieving Factors rest                [facility] PT Assessment - 11/18/21 1021       Standardized Balance Assessment   Standardized Balance Assessment Berg Balance Test;Timed Up and Go Test      Berg Balance Test   Sit to Stand Able to stand  independently using hands    Standing Unsupported Able to stand 30 seconds unsupported   1:29:75 with supervision before needing to sit down   Sitting with Back Unsupported but Feet Supported on Floor or Stool Able to sit safely and securely 2 minutes    Stand to Sit Controls descent by using hands    Transfers Able to transfer safely, definite need of hands    Standing Unsupported with Eyes Closed Able to stand 3 seconds   pt opened eyes at ~6 -7 seconds   Standing Unsupported with Feet Together Able to place feet together independently but unable to hold for 30 seconds   held for ~40 sec's before loss of balance   From Standing, Reach Forward with Outstretched Arm Can reach forward >12 cm safely (5")   6-7 inches   From Standing Position, Pick up Object from Floor Able to pick up shoe, needs supervision    From Standing Position, Turn to Look Behind Over each Shoulder Looks behind one side only/other side shows less weight shift   right>left side   Turn 360 Degrees Able to turn 360 degrees safely but slowly   >10 seconds   Standing Unsupported, Alternately Place Feet on Step/Stool Able to complete >2 steps/needs minimal assist   8 steps with min HHA   Standing Unsupported, One Foot in Front Able to take small step independently and hold 30 seconds    Standing on One Leg Unable to try or needs assist to prevent fall   reaching to hold surface despite cue   Total Score 33    Berg comment: 33/56 <36 high risk for falls      Timed Up and Go Test   TUG Normal TUG    Normal TUG (seconds) 30.69   with RW                    [facility] Adult PT Treatment/Exercise - 11/18/21 1021       Transfers   Transfers Sit to  Stand;Stand to Sit    Sit to Stand 5: Supervision    Five time sit to stand comments  17.94 sec's with single UE support from standard height surface with retropulsion noted with each rep    Stand to Sit 5: Supervision      Ambulation/Gait   Ambulation/Gait Yes    Ambulation/Gait Assistance 5: Supervision    Assistive device Rolling walker    Gait Pattern Step-through pattern;Decreased hip/knee flexion - left;Decreased step length - left    Ambulation Surface Level;Indoor      Exercises   Exercises Other Exercises    Other Exercises  pt reports doing her ex's. "they are alright". Feels they are still challenging to her.                       PT Short Term Goals - 11/18/21 1524       PT SHORT TERM GOAL #1   Title Improve TUG score from 32 secs to </= 27 secs with RW with SBA to demo improved functional mobility. (all STGs due 11/19/21)    Baseline 11/18/21: 30.69 sec's with RW, improved from 32 sec's just not to goals    Status Partially Met      PT SHORT TERM GOAL #2   Title Pt will perform 5 times sit to stand transfers from standard chair with only 1 UE support with SBA to demo increased LE strength and balance.    Baseline 11/18/21: 17.94 sec's with single UE support from standard height surface with retropulstion noted with each rep, min guard assist for safety    Status Achieved    Target Date 11/19/21      PT SHORT TERM GOAL #3   Title Increase Berg balance test score from 13/30 to >/= 19/30 to demo improved balance.    Baseline 11/18/21: 33/56 scored today    Status Achieved      PT SHORT TERM GOAL #4   Title Increase gait velocity by at least .4 ft/sec with RW to demo improved gait efficiency.    Baseline 11/02/21: 1.17 ft/sec with RW as baselne    Time 4    Period Weeks    Status On-going    Target Date 11/19/21      PT SHORT TERM GOAL #5   Title Amb. 64' with RW with SBA on flat, even surface for increased community accessibility.    Baseline 81' with  RW due to fatigue    Time 4    Period Weeks    Status On-going    Target Date 11/19/21      PT SHORT TERM GOAL #6   Title Independent with HEP for strengthening & balance exs.    Baseline 11/18/21: met with current program per pt and CNA    Time --    Period --    Status Achieved    Target Date 11/19/21               PT Long Term Goals - 11/17/21 1206       PT LONG TERM GOAL #1   Title Improve TUG score to </= 20 secs with RW with supervision to reduce fall risk.    Baseline 32 secs with RW    Time 12    Period Weeks    Status New    Target Date 01/14/22      PT LONG TERM GOAL #2   Title Pt will amb. 500' with RW with SBA on flat, even surface for increased community accessibility.    Baseline 38' with RW    Time 12  Period Weeks    Status New    Target Date 01/14/22      PT LONG TERM GOAL #3   Title Increase Berg balance test score from 13/56 to >/= 28/56 to reduce fall risk.    Time 12    Period Weeks    Status New    Target Date 01/14/22      PT LONG TERM GOAL #4   Title Increase gait velocity by at least 1.0 ft/sec with RW for incr. gait efficiency.    Time 12    Period Weeks    Status New    Target Date 01/14/22      PT LONG TERM GOAL #5   Title Perform sit to stand from chair without UE support to demo improved LE strength and balance.    Time 12    Period Weeks    Status New    Target Date 01/14/22      PT LONG TERM GOAL #6   Title Negotiate 4 steps with 1 hand rail using step by step sequence with SBA.    Time 12    Period Weeks    Status New    Target Date 01/14/22      PT LONG TERM GOAL #7   Title Independent in updated HEP for balance & strengthening.    Time 12    Period Weeks    Status New    Target Date 01/14/22                   Plan - 11/18/21 1021     Clinical Impression Statement Today's skilled session began to check progress toward STGs with goals. Pt has met goals 2, 3 and 6. Pt partially met goal #1. Will  plan to check remaining STGs at pt's next visit. The pt should benefit from continued PT to progress toward unmet goals.    Personal Factors and Comorbidities Fitness;Comorbidity 2;Behavior Pattern    Comorbidities CAD, ESRD on HD M-W-Fri, CKD stage V, iron deficiency anemia, DM type 2, bronchial asthma, chronic diastolic CHF, s/p Rt basal ganglia CVA, steal syndrome RUE with fistula implant    Examination-Activity Limitations Bathing;Bed Mobility;Locomotion Level;Transfers;Reach Overhead;Squat;Stairs;Stand;Toileting;Hygiene/Grooming;Bend;Carry    Examination-Participation Restrictions Cleaning;Community Activity;Driving;Interpersonal Relationship;Laundry;Meal Prep;Shop    Stability/Clinical Decision Making Evolving/Moderate complexity    Rehab Potential Good    PT Frequency 2x / week    PT Duration 12 weeks    PT Treatment/Interventions ADLs/Self Care Home Management;DME Instruction;Gait training;Stair training;Therapeutic activities;Therapeutic exercise;Balance training;Neuromuscular re-education;Patient/family education    PT Next Visit Plan finish checking STGs - continue with use of scifit/nustep for strengthening/activity tolerance; continue to address strengthening and balance;    PT Home Exercise Plan Access Code: [email or web address]    Consulted and Agree with Plan of Care Patient   CNA - Ms. Sherrod            Patient will benefit from skilled therapeutic intervention in order to improve the following deficits and impairments:  Abnormal gait, Decreased activity tolerance, Decreased balance, Decreased strength, Decreased endurance, Decreased range of motion, Impaired sensation, Impaired UE functional use  Visit Diagnosis: Hemiplegia and hemiparesis following cerebral infarction affecting left non-dominant side (HCC)  Muscle weakness (generalized)  Other abnormalities of gait and mobility  Unsteadiness on feet     Problem List Patient Active Problem List   Diagnosis Date Noted    Left hemiparesis ([place]) 09/24/2021   Abnormality of gait 09/24/2021   Pain of right hand 08/06/2021  Steal syndrome of dialysis vascular access ([place]) 08/06/2021   Subclavian steal syndrome of right subclavian artery 06/25/2021   Right hand weakness 06/25/2021   Stroke-like symptoms 05/06/2021   Bandemia    Cerebrovascular accident (CVA) of right basal ganglia ([place]) 04/20/2021   External hemorrhoids    Basal ganglia infarction ([place]) 04/16/2021   ESRD on dialysis ([place]) 04/16/2021   Hypertensive urgency 03/10/2021   Hilar enlargement    Anemia of chronic disease    Chronic diastolic CHF (congestive heart failure) ([place]) 03/09/2021   CKD (chronic kidney disease), stage V ([place]) 03/08/2021   Diabetic retinopathy ([place]) 03/08/2021   Hyperglycemia due to type 2 diabetes mellitus ([place]) 03/08/2021   Pure hypercholesterolemia 03/08/2021   Anemia in chronic kidney disease 09/24/2020   Uncontrolled type 2 diabetes mellitus with hyperglycemia ([place]) 03/19/2019   Osteopenia 09/04/2018   Migraine 09/04/2018   Asthma 09/04/2018   Pseudophakia of both eyes 07/17/2018   Pseudophakia of left eye 07/03/2018   Open angle with borderline findings and high glaucoma risk in left eye 07/03/2018   Age-related nuclear cataract of right eye 07/03/2018   CAD (coronary artery disease), native coronary artery 04/27/2017   Abnormal nuclear stress test 04/04/2017   Dyspnea on exertion 03/09/2017   Appendicitis    Age-related hypermature cataract of both eyes 12/20/2016   Uterine leiomyoma 11/27/2012   Dyslipidemia (high LDL; low HDL) 11/25/2011   Obesity (BMI 30-39.9) 11/25/2011   Hypertensive disorder 11/21/2011   Type 2 diabetes mellitus ([place]) 11/21/2011   Abnormal EKG 11/21/2011    Willow Ora, PTA, [facility] [address], [place], [place] [address] [phone] 11/18/21, 3:31 PM   Name: Carolyn Russo MRN: [id number] Date of Birth: 21-Jun-1951

## 2021-11-19 ENCOUNTER — Telehealth: Payer: Self-pay

## 2021-11-19 NOTE — Chronic Care Management (AMB) (Signed)
Chronic Care Management Pharmacy Assistant   Name: Carolyn Russo  MRN: [id number] DOB: 1950/11/18  Reason for Encounter: Disease State/ Hypertension  Recent office visits:  None  Recent consult visits:  11-18-2021 Drema Balzarine, PTA (Rehab). Physical therapy for Muscle weakness (generalized), Unsteadiness on feet and Other abnormalities of gait and mobility.   11-18-2021 Delton Prairie, OT (Rehab). Occupational Therapy for: Muscle weakness (generalized) Other lack of coordination Unsteadiness on feet Acute pain of left shoulder Pain in right hand Other disturbances of skin sensation  11-16-2021 Rine, Selmer Dominion, OT (Rehab). Occupational Therapy for: Muscle weakness (generalized) Other lack of coordination Unsteadiness on feet Acute pain of left shoulder Pain in right hand Other disturbances of skin sensation  11-16-2021 Guido Sander, PT (Rehab). Physical therapy for: Other abnormalities of gait and mobility Unsteadiness on feet  11-11-2021 Rine, Selmer Dominion, OT (Rehab). Occupational Therapy for: Muscle weakness (generalized) Other lack of coordination Unsteadiness on feet Acute pain of left shoulder Pain in right hand Other disturbances of skin sensation  11-11-2021 Drema Balzarine, PTA (Rehab). Physical therapy for: Muscle weakness (generalized) Unsteadiness on feet Other abnormalities of gait and mobility    11-09-2021  Drema Balzarine, PTA (Rehab). Physical therapy for: Muscle weakness (generalized) Unsteadiness on feet Other abnormalities of gait and mobility  11-09-2021 Carey Bullocks, OT (Rehab). Follow up on splints.  11-04-2021 Hulda Marin, OT,  (Rehab). Visit withheld due to patient elevated blood pressure of 223/94.  11-02-2021 Rine, Selmer Dominion, OT (Rehab). Occupational Therapy for: Unsteadiness on feet Other lack of coordination Acute pain of left shoulder Pain in right hand Hemiplegia and hemiparesis following cerebral infarction  affecting left non-dominant side ([place]) Other disturbances of skin sensation  11-02-2021 Drema Balzarine, [facility] visits:  Medication Reconciliation was completed by comparing discharge summary, patients EMR and Pharmacy list, and upon discussion with patient.  Admitted to the hospital on 11-03-2021 due to Facial cellulitis. Discharge date was 11-04-2021 Discharged from [facility] Emergency Dept.  New?Medications Started at [facility] Discharge:?? Clindamycin 300 mg 4 times daily, X 7 days  Medication Changes at Hospital Discharge: None  Medications Discontinued at Hospital Discharge: None  Medications that remain the same after Hospital Discharge:??  -All other medications will remain the same.    Medications: Outpatient Encounter Medications as of 11/19/2021  Medication Sig   acetaminophen (OFIRMEV) 10 MG/ML SOLN Take by mouth.   albuterol (VENTOLIN HFA) 108 (90 Base) MCG/ACT inhaler Inhale 2 puffs into the lungs every 4 (four) hours as needed for wheezing or shortness of breath.   BESIVANCE 0.6 % SUSP Place 1 drop into both eyes See admin instructions. After  eye injections   carvedilol (COREG) 3.125 MG tablet Take 1 tablet (3.125 mg total) by mouth 2 (two) times daily with a meal.   cephALEXin (KEFLEX) 250 MG capsule Take 1 capsule (250 mg total) by mouth every 12 (twelve) hours. Be sure to dose after dialysis on dialysis days   clindamycin (CLEOCIN) 300 MG capsule Take 1 capsule (300 mg total) by mouth 4 (four) times daily. X 7 days   clopidogrel (PLAVIX) 75 MG tablet Take 1 tablet (75 mg total) by mouth daily.   Continuous Blood Gluc Receiver (FREESTYLE LIBRE 2 READER) DEVI by Does not apply route.   diclofenac Sodium (VOLTAREN) 1 % GEL Apply 2 g topically 4 (four) times daily. To left wrist   diphenhydrAMINE (BENADRYL) 25 MG tablet Take 25 mg by mouth every  6 (six) hours as needed for itching.   fluticasone (FLONASE) 50 MCG/ACT nasal spray Place 2 sprays into  the nose daily as needed for allergies.    hydrALAZINE (APRESOLINE) 10 MG tablet Take 1 tablet (10 mg total) by mouth every 8 (eight) hours as needed (Sys BP >180, hold on HD days).   hydrocortisone (ANUSOL-HC) 2.5 % rectal cream Place 1 application rectally 2 (two) times daily as needed for hemorrhoids or anal itching.   hydrocortisone cream 1 % Apply to affected area 2 times daily   hydrOXYzine (ATARAX/VISTARIL) 25 MG tablet Take 1 tablet (25 mg total) by mouth at bedtime as needed.   hydrOXYzine (VISTARIL) 25 MG capsule Take 25 mg by mouth 3 (three) times daily as needed. Dose unknown   insulin aspart protamine - aspart (NOVOLOG MIX 70/30 FLEXPEN) (70-30) 100 UNIT/ML FlexPen 35uam / 5u/25upm   insulin glargine (LANTUS) 100 UNIT/ML Solostar Pen Inject 13 Units into the skin daily.   Insulin Pen Needle (BD PEN NEEDLE NANO 2ND GEN) 32G X 4 MM MISC See admin instructions.   iron sucrose in sodium chloride 0.9 % 100 mL Inject into the vein.   levETIRAcetam (KEPPRA) 500 MG tablet Take 1 tablet (500 mg total) by mouth at bedtime.   melatonin 3 MG TABS tablet Take 1 tablet (3 mg total) by mouth at bedtime.   Methoxy PEG-Epoetin Beta (MIRCERA IJ) Mircera   methylphenidate (RITALIN) 5 MG tablet Take 1 tablet (5 mg total) by mouth 2 (two) times daily with breakfast and lunch.   mometasone-formoterol (DULERA) 200-5 MCG/ACT AERO Inhale 2 puffs into the lungs 2 (two) times daily.   Nutritional Supplements (,FEEDING SUPPLEMENT, PROSOURCE PLUS) liquid Take 30 mLs by mouth 2 (two) times daily between meals.   ondansetron (ZOFRAN) 4 MG tablet Take 1 tablet (4 mg total) by mouth every 6 (six) hours as needed for nausea or vomiting.   OneTouch Delica Lancets 65K MISC Use as directed to check blood sugars before breakfast and dinner dx: e11.65   pantoprazole (PROTONIX) 40 MG tablet Take 1 tablet (40 mg total) by mouth daily.   phenylephrine (,USE FOR PREPARATION-H,) 0.25 % suppository Place 1 suppository rectally 2  (two) times daily.   polyethylene glycol powder (GLYCOLAX/MIRALAX) 17 GM/SCOOP powder Take 17 g by mouth daily.   rosuvastatin (CRESTOR) 20 MG tablet Take 1 tablet (20 mg total) by mouth daily.   senna-docusate (SENOKOT-S) 8.6-50 MG tablet Take 2 tablets by mouth 2 (two) times daily.   [DISCONTINUED] iron polysaccharides (NIFEREX) 150 MG capsule Take 1 capsule (150 mg total) by mouth daily. (Patient not taking: No sig reported)   No facility-administered encounter medications on file as of 11/19/2021.  Reviewed chart prior to disease state call.   Recent Office Vitals: BP Readings from Last 3 Encounters:  11/18/21 (!) 183/82  11/04/21 (!) 223/94  11/04/21 (!) 155/60   Pulse Readings from Last 3 Encounters:  11/18/21 76  11/04/21 80  09/27/21 74    Wt Readings from Last 3 Encounters:  11/03/21 197 lb (89.4 kg)  09/27/21 197 lb (89.4 kg)  09/24/21 192 lb 9.6 oz (87.4 kg)     Kidney Function Lab Results  Component Value Date/Time   CREATININE 5.29 (H) 05/26/2021 12:41 PM   CREATININE 6.14 (H) 05/24/2021 12:53 PM   CREATININE 0.84 10/13/2011 09:03 AM   GFRNONAA 8 (L) 05/26/2021 12:41 PM   GFRNONAA 75 10/13/2011 09:03 AM   GFRAA 17 06/19/2020 12:00 AM   GFRAA 86  10/13/2011 09:03 AM    BMP Latest Ref Rng & Units 05/26/2021 05/24/2021 05/21/2021  Glucose 70 - 99 mg/dL 186(H) 170(H) 147(H)  BUN 8 - 23 mg/dL 22 30(H) 12  Creatinine 0.44 - 1.00 mg/dL 5.29(H) 6.14(H) 2.55(H)  BUN/Creat Ratio 12 - 28 - - -  Sodium 135 - 145 mmol/L 129(L) 130(L) 134(L)  Potassium 3.5 - 5.1 mmol/L 4.8 4.6 3.4(L)  Chloride 98 - 111 mmol/L 96(L) 96(L) 98  CO2 22 - 32 mmol/L 24 24 30   Calcium 8.9 - 10.3 mg/dL 9.3 9.3 7.8(L)    11-19-2021: 1st attempt Left VM 11-22-2021: 2nd attempt left VM 11-23-2021: 3rd attempt left VM  Care Gaps: PNA vac overdue Shingrix overdue Dexa scan overdue Tdap overdue last completed 07-23-2008 3rd covid vaccine overdue last completed 01-07-2020 Yearly Ophthalmology  overdue last completed 11-18-2019 Colonoscopy overdue last completed 04-10-2018 Flu vaccine overdue last completed 08-12-2020 AWV 09-02-2021  Star Rating Drugs: Rosuvastatin 20 mg- Last filled 08-18-2021 90 DS [place] Clinical Pharmacist Assistant [phone]

## 2021-11-23 ENCOUNTER — Ambulatory Visit: Payer: Medicare PPO | Admitting: Physical Therapy

## 2021-11-23 ENCOUNTER — Other Ambulatory Visit: Payer: Self-pay

## 2021-11-23 ENCOUNTER — Ambulatory Visit: Payer: Medicare PPO | Admitting: Occupational Therapy

## 2021-11-23 DIAGNOSIS — I69354 Hemiplegia and hemiparesis following cerebral infarction affecting left non-dominant side: Secondary | ICD-10-CM

## 2021-11-23 DIAGNOSIS — M6281 Muscle weakness (generalized): Secondary | ICD-10-CM

## 2021-11-23 DIAGNOSIS — R2689 Other abnormalities of gait and mobility: Secondary | ICD-10-CM | POA: Diagnosis not present

## 2021-11-23 DIAGNOSIS — R2681 Unsteadiness on feet: Secondary | ICD-10-CM

## 2021-11-23 DIAGNOSIS — R278 Other lack of coordination: Secondary | ICD-10-CM

## 2021-11-23 NOTE — Therapy (Signed)
[facility] [address] [place], [name], [address] Phone: [phone]   Fax:  [phone]  Occupational Therapy Treatment  Patient Details  Name: Carolyn Russo MRN: [id number] Date of Birth: 1951/06/15 Referring Provider (OT): Dr. Dagoberto Ligas   Encounter Date: 11/23/2021   OT End of Session - 11/23/21 1412     Visit Number 7    Number of Visits 25    Date for OT Re-Evaluation 01/13/22    Authorization Type Medicare, State BCBS    Authorization - Visit Number 7    Authorization - Number of Visits 10    Progress Note Due on Visit 10    OT Start Time 1230    OT Stop Time 1310    OT Time Calculation (min) 40 min    Activity Tolerance Patient tolerated treatment well    Behavior During Therapy [facility] for tasks assessed/performed             Past Medical History:  Diagnosis Date   Acute GI bleeding    Allergy    Anemia    Anterior chest wall pain    Appendicitis 1965   Asthma    Body mass index 37.0-37.9, adult    Breast pain    Cataract    both eyes   CHF (congestive heart failure) ([place])    Chronic kidney disease    stage 5 - on dialysis   Cognitive change 04/20/2021   r/t cva 03/2821   Dehydration 2014   Deviated septum 1971   Diabetes mellitus    no meds   Dyspnea 2014   Extrinsic asthma    WITH ASTHMA ATTACK   Fibroid 1980   GERD (gastroesophageal reflux disease)    Heart murmur    Hx gestational diabetes    Hyperlipidemia    Hypertension 2014   Inguinal hernia 1959   Malaise and fatigue 2014   Non-IgE mediated allergic asthma 2014   Obesity    Pelvic pain    Pregnancy, high-risk 1985   Stroke ([place]) 04/20/2021   (CVA) of right basal ganglia   Tonsillitis 1968   Uterine fibroid 1980    Past Surgical History:  Procedure Laterality Date   APPENDECTOMY  7616   [place] Right 04/30/2021   Procedure: RIGHT FIRST STAGE [place];  Surgeon: Angelia Mould, MD;  Location: [facility] OR;  Service: Vascular;  Laterality: Right;   CESAREAN SECTION  1985   COLONOSCOPY     ESOPHAGOGASTRODUODENOSCOPY (EGD) WITH PROPOFOL N/A 04/18/2021   Procedure: ESOPHAGOGASTRODUODENOSCOPY (EGD) WITH PROPOFOL;  Surgeon: Gatha Mayer, MD;  Location: [facility] ENDOSCOPY;  Service: Endoscopy;  Laterality: N/A;   EYE SURGERY     bilateral cataract    FLEXIBLE SIGMOIDOSCOPY N/A 04/18/2021   Procedure: FLEXIBLE SIGMOIDOSCOPY;  Surgeon: Gatha Mayer, MD;  Location: [facility] ENDOSCOPY;  Service: Endoscopy;  Laterality: N/A;   HERNIA REPAIR  1959   IR FLUORO GUIDE CV LINE RIGHT  03/18/2021   IR US GUIDE VASC ACCESS RIGHT  03/18/2021   LEFT HEART CATH AND CORONARY ANGIOGRAPHY N/A 04/04/2017   Procedure: Left Heart Cath and Coronary Angiography;  Surgeon: Belva Crome, MD;  Location: [facility] CV LAB;  Service: Cardiovascular;  Laterality: N/A;   MYOMECTOMY  1980, 2004, 2007   RHINOPLASTY  1971   ROTATOR CUFF REPAIR  2003   SURGICAL REPAIR OF HEMORRHAGE  2015   TONSILLECTOMY  1968    There were no vitals  filed for this visit.   Subjective Assessment - 11/23/21 1231     Subjective  Don't drop my arm    Pertinent History [name] is a 71 y.o. female with PMHx of obesity, DM, HTN, HLD, ESRD on HD who presented with left sided weakness for 3 days on 04/16/2021. Pt was diagnosed with R basal ganglia infarct Pt went to rehab but then transferred back to acute service on 7/13 for likely seizure. Pt returned to CIR and was d/c home on 05/27/21. Pt received HHOT and PT. Pt has R hand weakness s/p fistula placement. PMH: diabetes, hypertension, hyperlipidemia, ESRD on HD MWF.    Patient Stated Goals to be like I was prior to my stroke    Currently in Pain? No/denies              Pt w/ noted mild edema Rt hand pooling at MP joints. Retrograde massage to hand to decrease swelling. Pt/caregiver told to monitor as this may impede wearing anti-claw splint. Reviewed not  wearing splints during ambulation as they impede with gripping walker.   Supine: BUE chest press w/ PVC frame, followed by BUE sh flexion w/ foam roll. P/ROM in higher sh flexion to prevent frozen shoulder.   Seated: AA/ROM in low range shoulder flexion, mid level closed chain sh flexion LUE using UE Ranger, as well as BUE closed chain sh flex alternating and simultaneously (against therapist hands)   Flipping cards over Rt hand w/ cues for intrinsic + movement as able                      OT Short Term Goals - 11/09/21 1111       OT SHORT TERM GOAL #1   Title I with initial HEP    Time 4    Period Weeks    Status New    Target Date 11/18/21      OT SHORT TERM GOAL #2   Title Pt will donn shirt  and bathe UB with min A consistently    Time 4    Period Weeks    Status New      OT SHORT TERM GOAL #3   Title Pt will be I with bilateral UE positioning including splinting  to minimize pain and risk for contracture and injury.    Time 4    Period Weeks    Status New      OT SHORT TERM GOAL #4   Title Pt will demonstrate 60* LUE shoulder flexion in prep for functional reach    Time 4    Period Weeks    Status New      OT SHORT TERM GOAL #5   Title Pt will demonstrate improved bilateral UE functional use as eveidenced by increasing bilateral box/ blocks score by 3 blocks.    Time 4    Period Weeks    Status New               OT Long Term Goals - 10/21/21 1350       OT LONG TERM GOAL #1   Title Pt will perform UB dressing with setup and LB dressing with min A.    Time 12    Period Weeks    Status New      OT LONG TERM GOAL #2   Title Pt will bathe herself with min A.    Time 12    Period Weeks  Status New      OT LONG TERM GOAL #3   Title Pt will perform simple home management/ cooking tasks with min A.    Time 12    Period Weeks    Status New      OT LONG TERM GOAL #4   Title Pt will retrieve a lightweight object at 75* shoulder  flexion with LUE.    Time 12    Period Weeks    Status New      OT LONG TERM GOAL #5   Title Pt will increase RUE grip strength to 15 lbs for increased functional use.    Time 12    Period Weeks    Status New      OT LONG TERM GOAL #6   Title Pt will demonstrate ability to write her name and address with 100% legibility using AE prn.    Time 12    Period Weeks    Status New      OT LONG TERM GOAL #7   Title Pt will utilize RUE at least 75% of the time for ADLS/ IADLS with pain no greater than 4/10.    Time 12    Period Weeks    Status New                   Plan - 11/23/21 1413     Clinical Impression Statement Pt w/ increased swelling Rt hand today making donning/doffing anti-claw splint more difficult. Pt drowsy after P.T. session    OT Occupational Profile and History Detailed Assessment- Review of Records and additional review of physical, cognitive, psychosocial history related to current functional performance    Occupational performance deficits (Please refer to evaluation for details): ADL's;IADL's;Play;Social Participation;Rest and Sleep    Body Structure / Function / Physical Skills ADL;Endurance;UE functional use;Balance;Flexibility;Pain;FMC;ROM;Gait;Coordination;GMC;Sensation;Decreased knowledge of precautions;Decreased knowledge of use of DME;IADL;Dexterity;Strength;Mobility    Rehab Potential Good    OT Frequency 2x / week    OT Duration 12 weeks    OT Treatment/Interventions Self-care/ADL training;Ultrasound;DME and/or AE instruction;Patient/family education;Balance training;Passive range of motion;Paraffin;Cryotherapy;Fluidtherapy;Splinting;Functional Mobility Training;Electrical Stimulation;Moist Heat;Therapeutic exercise;Manual Therapy;Therapeutic activities;Neuromuscular education    Plan NMR, ADL strategies donning shirt/ jacket, functional use of UE's    Consulted and Agree with Plan of Care Patient;Other (Comment)   hired caregiver briefly, pt's  husband            Patient will benefit from skilled therapeutic intervention in order to improve the following deficits and impairments:   Body Structure / Function / Physical Skills: ADL, Endurance, UE functional use, Balance, Flexibility, Pain, FMC, ROM, Gait, Coordination, GMC, Sensation, Decreased knowledge of precautions, Decreased knowledge of use of DME, IADL, Dexterity, Strength, Mobility       Visit Diagnosis: Hemiplegia and hemiparesis following cerebral infarction affecting left non-dominant side (HCC)  Muscle weakness (generalized)  Other lack of coordination    Problem List Patient Active Problem List   Diagnosis Date Noted   Left hemiparesis ([place]) 09/24/2021   Abnormality of gait 09/24/2021   Pain of right hand 08/06/2021   Steal syndrome of dialysis vascular access ([place]) 08/06/2021   Subclavian steal syndrome of right subclavian artery 06/25/2021   Right hand weakness 06/25/2021   Stroke-like symptoms 05/06/2021   Bandemia    Cerebrovascular accident (CVA) of right basal ganglia ([place]) 04/20/2021   External hemorrhoids    Basal ganglia infarction ([place]) 04/16/2021   ESRD on dialysis ([place]) 04/16/2021   Hypertensive urgency 03/10/2021   Hilar  enlargement    Anemia of chronic disease    Chronic diastolic CHF (congestive heart failure) ([place]) 03/09/2021   CKD (chronic kidney disease), stage V ([place]) 03/08/2021   Diabetic retinopathy ([place]) 03/08/2021   Hyperglycemia due to type 2 diabetes mellitus ([place]) 03/08/2021   Pure hypercholesterolemia 03/08/2021   Anemia in chronic kidney disease 09/24/2020   Uncontrolled type 2 diabetes mellitus with hyperglycemia ([place]) 03/19/2019   Osteopenia 09/04/2018   Migraine 09/04/2018   Asthma 09/04/2018   Pseudophakia of both eyes 07/17/2018   Pseudophakia of left eye 07/03/2018   Open angle with borderline findings and high glaucoma risk in left eye 07/03/2018   Age-related nuclear cataract of right eye 07/03/2018   CAD  (coronary artery disease), native coronary artery 04/27/2017   Abnormal nuclear stress test 04/04/2017   Dyspnea on exertion 03/09/2017   Appendicitis    Age-related hypermature cataract of both eyes 12/20/2016   Uterine leiomyoma 11/27/2012   Dyslipidemia (high LDL; low HDL) 11/25/2011   Obesity (BMI 30-39.9) 11/25/2011   Hypertensive disorder 11/21/2011   Type 2 diabetes mellitus ([place]) 11/21/2011   Abnormal EKG 11/21/2011    Carey Bullocks, OTR/L 11/23/2021, 2:15 PM  [facility] [address] [place], [name], [address] Phone: [phone]   Fax:  [phone]  Name: ALLENA PIETILA MRN: [id number] Date of Birth: January 10, 1951

## 2021-11-23 NOTE — Therapy (Signed)
[facility] [address] [place], [name], [address] Phone: [phone]   Fax:  [phone]  Physical Therapy Treatment  Patient Details  Name: Carolyn Russo MRN: [id number] Date of Birth: Nov 07, 1950 Referring Provider (PT): Courtney Heys, MD   Encounter Date: 11/23/2021   PT End of Session - 11/23/21 1903     Visit Number 7    Number of Visits 25    Date for PT Re-Evaluation 01/14/22    Authorization Type [employer] [place] submitted    PT Start Time 1146    PT Stop Time 1225    PT Time Calculation (min) 39 min    Equipment Utilized During Treatment Gait belt    Activity Tolerance Patient limited by fatigue;Patient tolerated treatment well    Behavior During Therapy WFL for tasks assessed/performed             Past Medical History:  Diagnosis Date   Acute GI bleeding    Allergy    Anemia    Anterior chest wall pain    Appendicitis 1965   Asthma    Body mass index 37.0-37.9, adult    Breast pain    Cataract    both eyes   CHF (congestive heart failure) ([place])    Chronic kidney disease    stage 5 - on dialysis   Cognitive change 04/20/2021   r/t cva 03/2821   Dehydration 2014   Deviated septum 1971   Diabetes mellitus    no meds   Dyspnea 2014   Extrinsic asthma    WITH ASTHMA ATTACK   Fibroid 1980   GERD (gastroesophageal reflux disease)    Heart murmur    Hx gestational diabetes    Hyperlipidemia    Hypertension 2014   Inguinal hernia 1959   Malaise and fatigue 2014   Non-IgE mediated allergic asthma 2014   Obesity    Pelvic pain    Pregnancy, high-risk 1985   Stroke ([place]) 04/20/2021   (CVA) of right basal ganglia   Tonsillitis 1968   Uterine fibroid 1980    Past Surgical History:  Procedure Laterality Date   APPENDECTOMY  1638   BASCILIC VEIN TRANSPOSITION Right 04/30/2021   Procedure: RIGHT FIRST STAGE [place];  Surgeon: Angelia Mould, MD;   Location: [facility] OR;  Service: Vascular;  Laterality: Right;   CESAREAN SECTION  1985   COLONOSCOPY     ESOPHAGOGASTRODUODENOSCOPY (EGD) WITH PROPOFOL N/A 04/18/2021   Procedure: ESOPHAGOGASTRODUODENOSCOPY (EGD) WITH PROPOFOL;  Surgeon: Gatha Mayer, MD;  Location: [facility] ENDOSCOPY;  Service: Endoscopy;  Laterality: N/A;   EYE SURGERY     bilateral cataract    FLEXIBLE SIGMOIDOSCOPY N/A 04/18/2021   Procedure: FLEXIBLE SIGMOIDOSCOPY;  Surgeon: Gatha Mayer, MD;  Location: [facility] ENDOSCOPY;  Service: Endoscopy;  Laterality: N/A;   HERNIA REPAIR  1959   IR FLUORO GUIDE CV LINE RIGHT  03/18/2021   IR US GUIDE VASC ACCESS RIGHT  03/18/2021   LEFT HEART CATH AND CORONARY ANGIOGRAPHY N/A 04/04/2017   Procedure: Left Heart Cath and Coronary Angiography;  Surgeon: Belva Crome, MD;  Location: [facility] CV LAB;  Service: Cardiovascular;  Laterality: N/A;   MYOMECTOMY  1980, 2004, 2007   RHINOPLASTY  1971   ROTATOR CUFF REPAIR  2003   SURGICAL REPAIR OF HEMORRHAGE  2015   TONSILLECTOMY  1968    There were no vitals filed for this visit.   Subjective Assessment - 11/23/21 1855  Subjective Pt reports she forgot her hand splint today (claw hand splint); pt reports no problems or changes    Patient is accompained by: --   CNA, Ms. Sherrod   Pertinent History ESRD with HD M-W-Fri:, obesity, DM type 2, CAD, iron deficiency anemia, bronchial asthma, chronic diastolic CHF, Rt basal ganglia CVA on 04-16-21, steal syndrome RUE due to dialysis vascular access (s/p fistula placement), CKD stage V:  Rt hand weakness; Lt hemiparesis due to CVA    Patient Stated Goals "be independent with mobility and self care"; "be able to drive again"    Currently in Pain? No/denies    Pain Onset More than a month ago                               [facility] Adult PT Treatment/Exercise - 11/23/21 0001       Transfers   Transfers Sit to Stand;Stand to Sit    Sit to Stand 5: Supervision    Stand to Sit 5:  Supervision    Comments LUE support from mat - pt placed RUE on RW prior to standing      Ambulation/Gait   Ambulation/Gait Yes    Ambulation/Gait Assistance 5: Supervision    Ambulation Distance (Feet) 190 Feet   pt needed to sit after amb. 190' due to fatigue; goal was 230'   Assistive device Rolling walker    Gait Pattern Step-through pattern;Decreased hip/knee flexion - left;Decreased step length - left    Ambulation Surface Level;Indoor    Gait velocity 18.4 secs = 1.74 ft/sec with RW    Stairs Yes    Stairs Assistance 4: Min guard    Stair Management Technique One rail Right;Step to pattern;Sideways    Number of Stairs 4    Height of Stairs 6    Door Management 3: Mod assist   back door of clinic leading to outside   Door Managment Details (indicate cue type and reason) pt needed mod assist to hold door open while she walked through doorway - wanted to step outside to breathe fresh air            Gait velocity 18.4 secs = 1.74 ft/sec with RW     Balance Exercises - 11/23/21 0001       Balance Exercises: Standing   Sidestepping 1 rep   along counter with UE support   Other Standing Exercises tap ups to 6" step 5 reps each foot with bil. UE support; then 5 reps each foot with LUE support only on RW            Pt performed alternating forward kicks 5 reps each leg with LUE support on counter with CGA on Rt side  Marching in place 10 reps each leg with UE support on counter    PT Short Term Goals - 11/23/21 1151       PT SHORT TERM GOAL #1   Title Improve TUG score from 32 secs to </= 27 secs with RW with SBA to demo improved functional mobility. (all STGs due 12-17-21)    Baseline 11/18/21: 30.69 sec's with RW, improved from 32 sec's just not to goals    Status Partially Met    Target Date 12/17/21      PT SHORT TERM GOAL #2   Title Pt will perform 5 times sit to stand transfers from standard chair with only 1 UE support with SBA to demo  increased LE strength  and balance. Update:  Perform sit to stand without UE support from mat for 3 reps to demo improved LE strength.    Baseline 11/18/21: 17.94 sec's with single UE support from standard height surface with retropulstion noted with each rep, min guard assist for safety    Status Revised    Target Date 12/17/21      PT SHORT TERM GOAL #3   Title Increase Berg balance test score from 13/30 to >/= 19/30 to demo improved balance.  Updated STG; increase score to >/= 38/56    Baseline 11/18/21: 33/56 scored today    Status Revised    Target Date 12/17/21      PT SHORT TERM GOAL #4   Title Increase gait velocity by at least .4 ft/sec with RW to demo improved gait efficiency. Revised updated LTG:  increase gait velocity to >/= 2.0 ft/sec with RW    Baseline 11/02/21: 1.17 ft/sec with RW as baselne:    18.4, 18.82 = 1.74 ft/sec    Time 4    Period Weeks    Status Revised    Target Date 12/17/21      PT SHORT TERM GOAL #5   Title Amb. 46' with RW with SBA on flat, even surface for increased community accessibility.    Baseline 97' with RW due to fatigue -   11-23-21 = 190' with RW    Time 4    Period Weeks    Status On-going    Target Date 12/17/21      PT SHORT TERM GOAL #6   Title Independent with HEP for strengthening & balance exs.    Baseline 11/18/21: met with current program per pt and CNA    Status Achieved    Target Date 11/19/21               PT Long Term Goals - 11/17/21 1206       PT LONG TERM GOAL #1   Title Improve TUG score to </= 20 secs with RW with supervision to reduce fall risk.    Baseline 32 secs with RW    Time 12    Period Weeks    Status New    Target Date 01/14/22      PT LONG TERM GOAL #2   Title Pt will amb. 500' with RW with SBA on flat, even surface for increased community accessibility.    Baseline 74' with RW    Time 12    Period Weeks    Status New    Target Date 01/14/22      PT LONG TERM GOAL #3   Title Increase Berg balance test score from  13/56 to >/= 28/56 to reduce fall risk.    Time 12    Period Weeks    Status New    Target Date 01/14/22      PT LONG TERM GOAL #4   Title Increase gait velocity by at least 1.0 ft/sec with RW for incr. gait efficiency.    Time 12    Period Weeks    Status New    Target Date 01/14/22      PT LONG TERM GOAL #5   Title Perform sit to stand from chair without UE support to demo improved LE strength and balance.    Time 12    Period Weeks    Status New    Target Date 01/14/22      PT LONG TERM GOAL #  6   Title Negotiate 4 steps with 1 hand rail using step by step sequence with SBA.    Time 12    Period Weeks    Status New    Target Date 01/14/22      PT LONG TERM GOAL #7   Title Independent in updated HEP for balance & strengthening.    Time 12    Period Weeks    Status New    Target Date 01/14/22                   Plan - 11/23/21 1912     Clinical Impression Statement Pt has met STG #4 with gait velocity 1.74 ft/sec with RW; STG #5 partially met with pt amb. 190' with RW prior to fatigue with pt needing a seated rest period (goal 230').  Pt noted to have some dyspnea with acitvity, which she partially attributed to her having to wear mask.  Pt requested to step outside to get some fresh air which she stated helped her to feel better.  Cont with POC.    Personal Factors and Comorbidities Fitness;Comorbidity 2;Behavior Pattern    Comorbidities CAD, ESRD on HD M-W-Fri, CKD stage V, iron deficiency anemia, DM type 2, bronchial asthma, chronic diastolic CHF, s/p Rt basal ganglia CVA, steal syndrome RUE with fistula implant    Examination-Activity Limitations Bathing;Bed Mobility;Locomotion Level;Transfers;Reach Overhead;Squat;Stairs;Stand;Toileting;Hygiene/Grooming;Bend;Carry    Examination-Participation Restrictions Cleaning;Community Activity;Driving;Interpersonal Relationship;Laundry;Meal Prep;Shop    Stability/Clinical Decision Making Evolving/Moderate complexity     Rehab Potential Good    PT Frequency 2x / week    PT Duration 12 weeks    PT Treatment/Interventions ADLs/Self Care Home Management;DME Instruction;Gait training;Stair training;Therapeutic activities;Therapeutic exercise;Balance training;Neuromuscular re-education;Patient/family education    PT Next Visit Plan continue with use of scifit/nustep for strengthening/activity tolerance; continue to address strengthening and balance;    PT Home Exercise Plan Access Code: [email or web address]    Consulted and Agree with Plan of Care Patient   CNA - Ms. Sherrod            Patient will benefit from skilled therapeutic intervention in order to improve the following deficits and impairments:  Abnormal gait, Decreased activity tolerance, Decreased balance, Decreased strength, Decreased endurance, Decreased range of motion, Impaired sensation, Impaired UE functional use  Visit Diagnosis: Hemiplegia and hemiparesis following cerebral infarction affecting left non-dominant side (HCC)  Unsteadiness on feet  Other abnormalities of gait and mobility     Problem List Patient Active Problem List   Diagnosis Date Noted   Left hemiparesis ([place]) 09/24/2021   Abnormality of gait 09/24/2021   Pain of right hand 08/06/2021   Steal syndrome of dialysis vascular access ([place]) 08/06/2021   Subclavian steal syndrome of right subclavian artery 06/25/2021   Right hand weakness 06/25/2021   Stroke-like symptoms 05/06/2021   Bandemia    Cerebrovascular accident (CVA) of right basal ganglia ([place]) 04/20/2021   External hemorrhoids    Basal ganglia infarction ([place]) 04/16/2021   ESRD on dialysis ([place]) 04/16/2021   Hypertensive urgency 03/10/2021   Hilar enlargement    Anemia of chronic disease    Chronic diastolic CHF (congestive heart failure) ([place]) 03/09/2021   CKD (chronic kidney disease), stage V ([place]) 03/08/2021   Diabetic retinopathy ([place]) 03/08/2021   Hyperglycemia due to type 2 diabetes mellitus ([place]) 03/08/2021    Pure hypercholesterolemia 03/08/2021   Anemia in chronic kidney disease 09/24/2020   Uncontrolled type 2 diabetes mellitus with hyperglycemia ([place]) 03/19/2019   Osteopenia 09/04/2018  Migraine 09/04/2018   Asthma 09/04/2018   Pseudophakia of both eyes 07/17/2018   Pseudophakia of left eye 07/03/2018   Open angle with borderline findings and high glaucoma risk in left eye 07/03/2018   Age-related nuclear cataract of right eye 07/03/2018   CAD (coronary artery disease), native coronary artery 04/27/2017   Abnormal nuclear stress test 04/04/2017   Dyspnea on exertion 03/09/2017   Appendicitis    Age-related hypermature cataract of both eyes 12/20/2016   Uterine leiomyoma 11/27/2012   Dyslipidemia (high LDL; low HDL) 11/25/2011   Obesity (BMI 30-39.9) 11/25/2011   Hypertensive disorder 11/21/2011   Type 2 diabetes mellitus ([place]) 11/21/2011   Abnormal EKG 11/21/2011    Adreonna Yontz, [place], PT 11/23/2021, 7:29 PM  [facility] [address] [place], [name], [address] Phone: [phone]   Fax:  [phone]  Name: Carolyn Russo MRN: [id number] Date of Birth: 1950-12-30

## 2021-11-25 ENCOUNTER — Ambulatory Visit: Payer: Medicare PPO | Admitting: Occupational Therapy

## 2021-11-25 ENCOUNTER — Other Ambulatory Visit: Payer: Self-pay

## 2021-11-25 ENCOUNTER — Ambulatory Visit: Payer: Medicare PPO | Attending: Internal Medicine | Admitting: Physical Therapy

## 2021-11-25 ENCOUNTER — Encounter: Payer: Self-pay | Admitting: Physical Therapy

## 2021-11-25 DIAGNOSIS — M6281 Muscle weakness (generalized): Secondary | ICD-10-CM

## 2021-11-25 DIAGNOSIS — M25512 Pain in left shoulder: Secondary | ICD-10-CM | POA: Insufficient documentation

## 2021-11-25 DIAGNOSIS — I69354 Hemiplegia and hemiparesis following cerebral infarction affecting left non-dominant side: Secondary | ICD-10-CM

## 2021-11-25 DIAGNOSIS — R278 Other lack of coordination: Secondary | ICD-10-CM | POA: Diagnosis present

## 2021-11-25 DIAGNOSIS — R2689 Other abnormalities of gait and mobility: Secondary | ICD-10-CM | POA: Diagnosis present

## 2021-11-25 DIAGNOSIS — R2681 Unsteadiness on feet: Secondary | ICD-10-CM | POA: Insufficient documentation

## 2021-11-25 DIAGNOSIS — M79641 Pain in right hand: Secondary | ICD-10-CM | POA: Insufficient documentation

## 2021-11-25 DIAGNOSIS — R208 Other disturbances of skin sensation: Secondary | ICD-10-CM | POA: Diagnosis present

## 2021-11-25 NOTE — Therapy (Signed)
[facility] [address] [place], [name], [address] Phone: [phone]   Fax:  [phone]  Physical Therapy Treatment  Patient Details  Name: Carolyn Russo MRN: [id number] Date of Birth: 1951-05-02 Referring Provider (PT): Courtney Heys, MD   Encounter Date: 11/25/2021   PT End of Session - 11/25/21 1102     Visit Number 8    Number of Visits 25    Date for PT Re-Evaluation 01/14/22    Authorization Type [employer] submitted    PT Start Time 1100    PT Stop Time 1130    PT Time Calculation (min) 30 min    Equipment Utilized During Treatment Gait belt    Activity Tolerance Patient limited by fatigue;Patient limited by lethargy    Behavior During Therapy WFL for tasks assessed/performed             Past Medical History:  Diagnosis Date   Acute GI bleeding    Allergy    Anemia    Anterior chest wall pain    Appendicitis 1965   Asthma    Body mass index 37.0-37.9, adult    Breast pain    Cataract    both eyes   CHF (congestive heart failure) ([place])    Chronic kidney disease    stage 5 - on dialysis   Cognitive change 04/20/2021   r/t cva 03/2821   Dehydration 2014   Deviated septum 1971   Diabetes mellitus    no meds   Dyspnea 2014   Extrinsic asthma    WITH ASTHMA ATTACK   Fibroid 1980   GERD (gastroesophageal reflux disease)    Heart murmur    Hx gestational diabetes    Hyperlipidemia    Hypertension 2014   Inguinal hernia 1959   Malaise and fatigue 2014   Non-IgE mediated allergic asthma 2014   Obesity    Pelvic pain    Pregnancy, high-risk 1985   Stroke ([place]) 04/20/2021   (CVA) of right basal ganglia   Tonsillitis 1968   Uterine fibroid 1980    Past Surgical History:  Procedure Laterality Date   APPENDECTOMY  4709   BASCILIC VEIN TRANSPOSITION Right 04/30/2021   Procedure: RIGHT FIRST STAGE [place];  Surgeon: Angelia Mould, MD;  Location[facility]  OR;  Service: Vascular;  Laterality: Right;   CESAREAN SECTION  1985   COLONOSCOPY     ESOPHAGOGASTRODUODENOSCOPY (EGD) WITH PROPOFOL N/A 04/18/2021   Procedure: ESOPHAGOGASTRODUODENOSCOPY (EGD) WITH PROPOFOL;  Surgeon: Gatha Mayer, MD;  Location: [facility] ENDOSCOPY;  Service: Endoscopy;  Laterality: N/A;   EYE SURGERY     bilateral cataract    FLEXIBLE SIGMOIDOSCOPY N/A 04/18/2021   Procedure: FLEXIBLE SIGMOIDOSCOPY;  Surgeon: Gatha Mayer, MD;  Location: [facility] ENDOSCOPY;  Service: Endoscopy;  Laterality: N/A;   HERNIA REPAIR  1959   IR FLUORO GUIDE CV LINE RIGHT  03/18/2021   IR US GUIDE VASC ACCESS RIGHT  03/18/2021   LEFT HEART CATH AND CORONARY ANGIOGRAPHY N/A 04/04/2017   Procedure: Left Heart Cath and Coronary Angiography;  Surgeon: Belva Crome, MD;  Location: [facility] CV LAB;  Service: Cardiovascular;  Laterality: N/A;   MYOMECTOMY  1980, 2004, 2007   RHINOPLASTY  1971   ROTATOR CUFF REPAIR  2003   SURGICAL REPAIR OF HEMORRHAGE  2015   TONSILLECTOMY  1968    There were no vitals filed for this visit.   Subjective Assessment - 11/25/21 1101  Subjective No new complaitns. No falls. Some pain in right hand when puts pressure on it.    Patient is accompained by: --   CNA, Ms. Sherrod   Pertinent History ESRD with HD M-W-Fri:, obesity, DM type 2, CAD, iron deficiency anemia, bronchial asthma, chronic diastolic CHF, Rt basal ganglia CVA on 04-16-21, steal syndrome RUE due to dialysis vascular access (s/p fistula placement), CKD stage V:  Rt hand weakness; Lt hemiparesis due to CVA    Patient Stated Goals "be independent with mobility and self care"; "be able to drive again"    Currently in Pain? Yes    Pain Score 4     Pain Location Shoulder    Pain Orientation Left    Pain Descriptors / Indicators Sore    Pain Type Acute pain;Chronic pain    Pain Onset More than a month ago    Pain Frequency Intermittent    Aggravating Factors  cold rainy weather, pressure on arm/hand     Pain Relieving Factors rest, heat                       [facility] Adult PT Treatment/Exercise - 11/25/21 1103       Transfers   Transfers Sit to Stand;Stand to [employer] Transfers    Sit to Stand 5: Supervision;4: Min assist;4: Min guard;With upper extremity assist;From bed;From chair/3-in-1    Stand to Sit 5: Supervision;4: Min guard;With upper extremity assist;To bed;To chair/3-in-1    Stand Pivot Transfers 4: Min guard    Stand Pivot Transfer Details (indicate cue type and reason) with RW from Scifit to wheelchair    Comments min guard assist to supervision to transfer from RW to sitting in front passenger car seat. pt then able to pivot self around and lift her legs into car.      Ambulation/Gait   Ambulation/Gait Yes    Ambulation/Gait Assistance 4: Min guard    Ambulation/Gait Assistance Details pt able to walker from mat table to Scifit with min guard assist for safety, no balance issues noted. Once outside to get into car pt able to walk ~5-8 feet with RW to get into car.    Ambulation Distance (Feet) 40 Feet   x1, 10 x1   Assistive device Rolling walker    Gait Pattern Step-through pattern;Decreased hip/knee flexion - left;Decreased step length - left    Ambulation Surface Level;Indoor;Unlevel;Outdoor;Paved      Knee/Hip Exercises: Aerobic   Other Aerobic Scifit UE/LE's level 4 with working for 45-50 sec's, resting 10-15 sec's for 8 minutes total. max cues needed to stay awake and keep UE'/LE moving.  VS checked through out with SaO2 >/=94%, HR in 70's. Pt nodding off to sleep throughout with max cues to stay awake.                       PT Short Term Goals - 11/23/21 1151       PT SHORT TERM GOAL #1   Title Improve TUG score from 32 secs to </= 27 secs with RW with SBA to demo improved functional mobility. (all STGs due 12-17-21)    Baseline 11/18/21: 30.69 sec's with RW, improved from 32 sec's just not to goals    Status Partially Met    Target  Date 12/17/21      PT SHORT TERM GOAL #2   Title Pt will perform 5 times sit to stand transfers from standard chair with only 1  UE support with SBA to demo increased LE strength and balance. Update:  Perform sit to stand without UE support from mat for 3 reps to demo improved LE strength.    Baseline 11/18/21: 17.94 sec's with single UE support from standard height surface with retropulstion noted with each rep, min guard assist for safety    Status Revised    Target Date 12/17/21      PT SHORT TERM GOAL #3   Title Increase Berg balance test score from 13/30 to >/= 19/30 to demo improved balance.  Updated STG; increase score to >/= 38/56    Baseline 11/18/21: 33/56 scored today    Status Revised    Target Date 12/17/21      PT SHORT TERM GOAL #4   Title Increase gait velocity by at least .4 ft/sec with RW to demo improved gait efficiency. Revised updated LTG:  increase gait velocity to >/= 2.0 ft/sec with RW    Baseline 11/02/21: 1.17 ft/sec with RW as baselne:    18.4, 18.82 = 1.74 ft/sec    Time 4    Period Weeks    Status Revised    Target Date 12/17/21      PT SHORT TERM GOAL #5   Title Amb. 71' with RW with SBA on flat, even surface for increased community accessibility.    Baseline 43' with RW due to fatigue -   11-23-21 = 190' with RW    Time 4    Period Weeks    Status On-going    Target Date 12/17/21      PT SHORT TERM GOAL #6   Title Independent with HEP for strengthening & balance exs.    Baseline 11/18/21: met with current program per pt and CNA    Status Achieved    Target Date 11/19/21               PT Long Term Goals - 11/17/21 1206       PT LONG TERM GOAL #1   Title Improve TUG score to </= 20 secs with RW with supervision to reduce fall risk.    Baseline 32 secs with RW    Time 12    Period Weeks    Status New    Target Date 01/14/22      PT LONG TERM GOAL #2   Title Pt will amb. 500' with RW with SBA on flat, even surface for increased community  accessibility.    Baseline 9' with RW    Time 12    Period Weeks    Status New    Target Date 01/14/22      PT LONG TERM GOAL #3   Title Increase Berg balance test score from 13/56 to >/= 28/56 to reduce fall risk.    Time 12    Period Weeks    Status New    Target Date 01/14/22      PT LONG TERM GOAL #4   Title Increase gait velocity by at least 1.0 ft/sec with RW for incr. gait efficiency.    Time 12    Period Weeks    Status New    Target Date 01/14/22      PT LONG TERM GOAL #5   Title Perform sit to stand from chair without UE support to demo improved LE strength and balance.    Time 12    Period Weeks    Status New    Target Date 01/14/22  PT LONG TERM GOAL #6   Title Negotiate 4 steps with 1 hand rail using step by step sequence with SBA.    Time 12    Period Weeks    Status New    Target Date 01/14/22      PT LONG TERM GOAL #7   Title Independent in updated HEP for balance & strengthening.    Time 12    Period Weeks    Status New    Target Date 01/14/22                   Plan - 11/25/21 1103     Clinical Impression Statement Today's session was limited by fatigue. Pt initially alert and conversational. Once on the Scifit pt unable to stay awake, needing constant cues to open eyes/wake up. Pt was transfert to wheelchair to leave session (ended due to pt falling asleep) with min guard assist. VSS through out with SaO2 >/= 94% and HR in 70's. While dozing off pt noted to still be chewing her gum with no respiratory distress. Once outside to get into car pt became more alert. Able to stand from wheelchair, walk a few feet from wheelchair to car, sit into car with min guard assist and pivot in seat while lifting LE's into car without assistance. Pt's caregiver planned to check vitals again once they got home. CNA did report she does this at home as well when she gets tired. Will continue to monitor pt's respose to back to back sessions as she was this  tired with OT after seeing PT last date she was here. The pt should benefit from continued PT to progress toward unmet goals.    Personal Factors and Comorbidities Fitness;Comorbidity 2;Behavior Pattern    Comorbidities CAD, ESRD on HD M-W-Fri, CKD stage V, iron deficiency anemia, DM type 2, bronchial asthma, chronic diastolic CHF, s/p Rt basal ganglia CVA, steal syndrome RUE with fistula implant    Examination-Activity Limitations Bathing;Bed Mobility;Locomotion Level;Transfers;Reach Overhead;Squat;Stairs;Stand;Toileting;Hygiene/Grooming;Bend;Carry    Examination-Participation Restrictions Cleaning;Community Activity;Driving;Interpersonal Relationship;Laundry;Meal Prep;Shop    Stability/Clinical Decision Making Evolving/Moderate complexity    Rehab Potential Good    PT Frequency 2x / week    PT Duration 12 weeks    PT Treatment/Interventions ADLs/Self Care Home Management;DME Instruction;Gait training;Stair training;Therapeutic activities;Therapeutic exercise;Balance training;Neuromuscular re-education;Patient/family education    PT Next Visit Plan continue with use of scifit/nustep for strengthening/activity tolerance; continue to address strengthening and balance;    PT Home Exercise Plan Access Code: [email or web address]    Consulted and Agree with Plan of Care Patient   CNA - Ms. Sherrod            Patient will benefit from skilled therapeutic intervention in order to improve the following deficits and impairments:  Abnormal gait, Decreased activity tolerance, Decreased balance, Decreased strength, Decreased endurance, Decreased range of motion, Impaired sensation, Impaired UE functional use  Visit Diagnosis: Hemiplegia and hemiparesis following cerebral infarction affecting left non-dominant side (HCC)  Muscle weakness (generalized)  Unsteadiness on feet  Other abnormalities of gait and mobility     Problem List Patient Active Problem List   Diagnosis Date Noted   Left hemiparesis  ([place]) 09/24/2021   Abnormality of gait 09/24/2021   Pain of right hand 08/06/2021   Steal syndrome of dialysis vascular access ([place]) 08/06/2021   Subclavian steal syndrome of right subclavian artery 06/25/2021   Right hand weakness 06/25/2021   Stroke-like symptoms 05/06/2021   Bandemia    Cerebrovascular accident (CVA) of  right basal ganglia ([place]) 04/20/2021   External hemorrhoids    Basal ganglia infarction ([place]) 04/16/2021   ESRD on dialysis ([place]) 04/16/2021   Hypertensive urgency 03/10/2021   Hilar enlargement    Anemia of chronic disease    Chronic diastolic CHF (congestive heart failure) ([place]) 03/09/2021   CKD (chronic kidney disease), stage V ([place]) 03/08/2021   Diabetic retinopathy ([place]) 03/08/2021   Hyperglycemia due to type 2 diabetes mellitus ([place]) 03/08/2021   Pure hypercholesterolemia 03/08/2021   Anemia in chronic kidney disease 09/24/2020   Uncontrolled type 2 diabetes mellitus with hyperglycemia ([place]) 03/19/2019   Osteopenia 09/04/2018   Migraine 09/04/2018   Asthma 09/04/2018   Pseudophakia of both eyes 07/17/2018   Pseudophakia of left eye 07/03/2018   Open angle with borderline findings and high glaucoma risk in left eye 07/03/2018   Age-related nuclear cataract of right eye 07/03/2018   CAD (coronary artery disease), native coronary artery 04/27/2017   Abnormal nuclear stress test 04/04/2017   Dyspnea on exertion 03/09/2017   Appendicitis    Age-related hypermature cataract of both eyes 12/20/2016   Uterine leiomyoma 11/27/2012   Dyslipidemia (high LDL; low HDL) 11/25/2011   Obesity (BMI 30-39.9) 11/25/2011   Hypertensive disorder 11/21/2011   Type 2 diabetes mellitus ([place]) 11/21/2011   Abnormal EKG 11/21/2011    Willow Ora, PTA, [facility] [address] [place], [place] [address] [phone] 11/25/21, 5:19 PM   Name: KAITELYN JAMISON MRN: [id number] Date of Birth: 1951-05-18

## 2021-11-25 NOTE — Patient Instructions (Signed)
Flexion (Passive)    Sitting upright, fold hand towel and place hands on towel, slide BOTH ARMS forward along table, bending from the waist until a stretch is felt. Hold __3__ seconds. Repeat __10__ times. Do __2__ sessions per day.   Seated: Hold pool noodle both hands, and reach towards floor then back to lap slowly x 10 reps.    3. ELBOW: Extension / Chest Press (Frame)    Lie on back with knees bent. Straighten elbows to raise frame. Hold _3__ seconds. Use dowel, cane, yardstick, etc. . 10___ reps per set, _2__ sets per day,

## 2021-11-25 NOTE — Therapy (Signed)
[facility] [address] [place], [name], [address] Phone: [phone]   Fax:  [phone]  Occupational Therapy Treatment  Patient Details  Name: CORISA MONTINI MRN: [id number] Date of Birth: [birth year] Referring Provider (OT): Dr. Dagoberto Ligas   Encounter Date: 11/25/2021   OT End of Session - 11/25/21 1208     Visit Number 8    Number of Visits 25    Date for OT Re-Evaluation 01/13/22    Authorization Type Medicare, State BCBS    Authorization - Visit Number 8    Authorization - Number of Visits 10    Progress Note Due on Visit 10    OT Start Time 1015    OT Stop Time 1100    OT Time Calculation (min) 45 min    Activity Tolerance Patient tolerated treatment well    Behavior During Therapy [facility] for tasks assessed/performed             Past Medical History:  Diagnosis Date   Acute GI bleeding    Allergy    Anemia    Anterior chest wall pain    Appendicitis 1965   Asthma    Body mass index 37.0-37.9, adult    Breast pain    Cataract    both eyes   CHF (congestive heart failure) ([place])    Chronic kidney disease    stage 5 - on dialysis   Cognitive change 04/20/2021   r/t cva 03/2821   Dehydration 2014   Deviated septum 1971   Diabetes mellitus    no meds   Dyspnea 2014   Extrinsic asthma    WITH ASTHMA ATTACK   Fibroid 1980   GERD (gastroesophageal reflux disease)    Heart murmur    Hx gestational diabetes    Hyperlipidemia    Hypertension 2014   Inguinal hernia 1959   Malaise and fatigue 2014   Non-IgE mediated allergic asthma 2014   Obesity    Pelvic pain    Pregnancy, high-risk 1985   Stroke ([place]) 04/20/2021   (CVA) of right basal ganglia   Tonsillitis 1968   Uterine fibroid 1980    Past Surgical History:  Procedure Laterality Date   APPENDECTOMY  7026   BASCILIC VEIN TRANSPOSITION Right 04/30/2021   Procedure: RIGHT FIRST STAGE [place];  Surgeon: Angelia Mould, MD;  Location: [facility] OR;  Service: Vascular;  Laterality: Right;   CESAREAN SECTION  1985   COLONOSCOPY     ESOPHAGOGASTRODUODENOSCOPY (EGD) WITH PROPOFOL N/A 04/18/2021   Procedure: ESOPHAGOGASTRODUODENOSCOPY (EGD) WITH PROPOFOL;  Surgeon: Gatha Mayer, MD;  Location: [facility] ENDOSCOPY;  Service: Endoscopy;  Laterality: N/A;   EYE SURGERY     bilateral cataract    FLEXIBLE SIGMOIDOSCOPY N/A 04/18/2021   Procedure: FLEXIBLE SIGMOIDOSCOPY;  Surgeon: Gatha Mayer, MD;  Location: [facility] ENDOSCOPY;  Service: Endoscopy;  Laterality: N/A;   HERNIA REPAIR  1959   IR FLUORO GUIDE CV LINE RIGHT  03/18/2021   IR US GUIDE VASC ACCESS RIGHT  03/18/2021   LEFT HEART CATH AND CORONARY ANGIOGRAPHY N/A 04/04/2017   Procedure: Left Heart Cath and Coronary Angiography;  Surgeon: Belva Crome, MD;  Location: [facility] CV LAB;  Service: Cardiovascular;  Laterality: N/A;   MYOMECTOMY  1980, 2004, 2007   RHINOPLASTY  1971   ROTATOR CUFF REPAIR  2003   SURGICAL REPAIR OF HEMORRHAGE  2015   TONSILLECTOMY  1968    There were no vitals  filed for this visit.   Subjective Assessment - 11/25/21 1033     Subjective  The weather makes my shoulder hurt more    Pertinent History Ms. Syvanna SHONDRIKA HOQUE is a 71 y.o. female with PMHx of obesity, DM, HTN, HLD, ESRD on HD who presented with left sided weakness for 3 days on 04/16/2021. Pt was diagnosed with R basal ganglia infarct Pt went to rehab but then transferred back to acute service on 7/13 for likely seizure. Pt returned to CIR and was d/c home on 05/27/21. Pt received HHOT and PT. Pt has R hand weakness s/p fistula placement. PMH: diabetes, hypertension, hyperlipidemia, ESRD on HD MWF.    Patient Stated Goals to be like I was prior to my stroke    Currently in Pain? Yes    Pain Score 4     Pain Location Shoulder    Pain Orientation Left    Pain Descriptors / Indicators Sore    Pain Type Acute pain;Chronic pain    Pain Onset More than a month ago    Pain  Frequency Intermittent    Aggravating Factors  cold rainy weather    Pain Relieving Factors rest, heat            Assessed both splints today: swelling has gone down Rt hand, therefore anti-claw splint fit well today. Reassessed resting hand splint as pt reported some scissoring of fingers (small finger going under ring finger), however reviewed proper donning of splint, finger placement, and strapping finger straps tighter to help prevent this. Recommended pt get assist to don both splints, but can doff I'ly  Supine: chest press motion with PVC frame BUE's - pt did not need physical assist for LUE today, only cues to fully extend elbow. Also reviewed BUE sh flexion/ext supine.   Seated: worked on BUE sh flexion into gravity reaching towards floor, in gravity elim plane and on inclined surface against gravity.   Issued updates to HEP - see pt instructions for details.                       OT Education - 11/25/21 1207     Education Details Updated HEP for LUE    Person(s) Educated Patient;Caregiver(s)    Methods Explanation;Demonstration;Verbal cues;Handout    Comprehension Verbalized understanding;Returned demonstration;Verbal cues required              OT Short Term Goals - 11/09/21 1111       OT SHORT TERM GOAL #1   Title I with initial HEP    Time 4    Period Weeks    Status New    Target Date 11/18/21      OT SHORT TERM GOAL #2   Title Pt will donn shirt  and bathe UB with min A consistently    Time 4    Period Weeks    Status New      OT SHORT TERM GOAL #3   Title Pt will be I with bilateral UE positioning including splinting  to minimize pain and risk for contracture and injury.    Time 4    Period Weeks    Status New      OT SHORT TERM GOAL #4   Title Pt will demonstrate 60* LUE shoulder flexion in prep for functional reach    Time 4    Period Weeks    Status New      OT SHORT TERM GOAL #5  Title Pt will demonstrate improved  bilateral UE functional use as eveidenced by increasing bilateral box/ blocks score by 3 blocks.    Time 4    Period Weeks    Status New               OT Long Term Goals - 10/21/21 1350       OT LONG TERM GOAL #1   Title Pt will perform UB dressing with setup and LB dressing with min A.    Time 12    Period Weeks    Status New      OT LONG TERM GOAL #2   Title Pt will bathe herself with min A.    Time 12    Period Weeks    Status New      OT LONG TERM GOAL #3   Title Pt will perform simple home management/ cooking tasks with min A.    Time 12    Period Weeks    Status New      OT LONG TERM GOAL #4   Title Pt will retrieve a lightweight object at 75* shoulder flexion with LUE.    Time 12    Period Weeks    Status New      OT LONG TERM GOAL #5   Title Pt will increase RUE grip strength to 15 lbs for increased functional use.    Time 12    Period Weeks    Status New      OT LONG TERM GOAL #6   Title Pt will demonstrate ability to write her name and address with 100% legibility using AE prn.    Time 12    Period Weeks    Status New      OT LONG TERM GOAL #7   Title Pt will utilize RUE at least 75% of the time for ADLS/ IADLS with pain no greater than 4/10.    Time 12    Period Weeks    Status New                   Plan - 11/25/21 1208     Clinical Impression Statement Pt with increased alertness and activation of LUE today (O.T. saw first today). Pt participating more in O.T. session    OT Occupational Profile and History Detailed Assessment- Review of Records and additional review of physical, cognitive, psychosocial history related to current functional performance    Occupational performance deficits (Please refer to evaluation for details): ADL's;IADL's;Play;Social Participation;Rest and Sleep    Body Structure / Function / Physical Skills ADL;Endurance;UE functional use;Balance;Flexibility;Pain;FMC;ROM;Gait;Coordination;GMC;Sensation;Decreased  knowledge of precautions;Decreased knowledge of use of DME;IADL;Dexterity;Strength;Mobility    Rehab Potential Good    OT Frequency 2x / week    OT Duration 12 weeks    OT Treatment/Interventions Self-care/ADL training;Ultrasound;DME and/or AE instruction;Patient/family education;Balance training;Passive range of motion;Paraffin;Cryotherapy;Fluidtherapy;Splinting;Functional Mobility Training;Electrical Stimulation;Moist Heat;Therapeutic exercise;Manual Therapy;Therapeutic activities;Neuromuscular education    Plan make taco splint for intrinsic + exercise, continue LUE NMR (use UE Ranger)    Consulted and Agree with Plan of Care Patient;Other (Comment)   hired caregiver briefly, pt's husband            Patient will benefit from skilled therapeutic intervention in order to improve the following deficits and impairments:   Body Structure / Function / Physical Skills: ADL, Endurance, UE functional use, Balance, Flexibility, Pain, FMC, ROM, Gait, Coordination, GMC, Sensation, Decreased knowledge of precautions, Decreased knowledge of use of DME, IADL, Dexterity, Strength,  Mobility       Visit Diagnosis: Hemiplegia and hemiparesis following cerebral infarction affecting left non-dominant side (HCC)  Muscle weakness (generalized)  Acute pain of left shoulder    Problem List Patient Active Problem List   Diagnosis Date Noted   Left hemiparesis ([place]) 09/24/2021   Abnormality of gait 09/24/2021   Pain of right hand 08/06/2021   Steal syndrome of dialysis vascular access ([place]) 08/06/2021   Subclavian steal syndrome of right subclavian artery 06/25/2021   Right hand weakness 06/25/2021   Stroke-like symptoms 05/06/2021   Bandemia    Cerebrovascular accident (CVA) of right basal ganglia ([place]) 04/20/2021   External hemorrhoids    Basal ganglia infarction ([place]) 04/16/2021   ESRD on dialysis ([place]) 04/16/2021   Hypertensive urgency 03/10/2021   Hilar enlargement    Anemia of chronic  disease    Chronic diastolic CHF (congestive heart failure) ([place]) 03/09/2021   CKD (chronic kidney disease), stage V ([place]) 03/08/2021   Diabetic retinopathy ([place]) 03/08/2021   Hyperglycemia due to type 2 diabetes mellitus ([place]) 03/08/2021   Pure hypercholesterolemia 03/08/2021   Anemia in chronic kidney disease 09/24/2020   Uncontrolled type 2 diabetes mellitus with hyperglycemia ([place]) 03/19/2019   Osteopenia 09/04/2018   Migraine 09/04/2018   Asthma 09/04/2018   Pseudophakia of both eyes 07/17/2018   Pseudophakia of left eye 07/03/2018   Open angle with borderline findings and high glaucoma risk in left eye 07/03/2018   Age-related nuclear cataract of right eye 07/03/2018   CAD (coronary artery disease), native coronary artery 04/27/2017   Abnormal nuclear stress test 04/04/2017   Dyspnea on exertion 03/09/2017   Appendicitis    Age-related hypermature cataract of both eyes 12/20/2016   Uterine leiomyoma 11/27/2012   Dyslipidemia (high LDL; low HDL) 11/25/2011   Obesity (BMI 30-39.9) 11/25/2011   Hypertensive disorder 11/21/2011   Type 2 diabetes mellitus ([place]) 11/21/2011   Abnormal EKG 11/21/2011    Carey Bullocks, OTR/L 11/25/2021, 12:10 PM  [facility] [address] [place], [name], [address] Phone: [phone]   Fax:  [phone]  Name: JALEESA CERVI MRN: [id number] Date of Birth: 1951-02-19

## 2021-11-30 ENCOUNTER — Ambulatory Visit: Payer: Medicare PPO | Admitting: Occupational Therapy

## 2021-11-30 ENCOUNTER — Telehealth: Payer: Self-pay | Admitting: *Deleted

## 2021-11-30 ENCOUNTER — Encounter: Payer: Self-pay | Admitting: Podiatry

## 2021-11-30 ENCOUNTER — Encounter (HOSPITAL_COMMUNITY): Payer: Self-pay

## 2021-11-30 ENCOUNTER — Ambulatory Visit (INDEPENDENT_AMBULATORY_CARE_PROVIDER_SITE_OTHER): Payer: Medicare PPO | Admitting: Podiatry

## 2021-11-30 ENCOUNTER — Other Ambulatory Visit: Payer: Self-pay

## 2021-11-30 ENCOUNTER — Ambulatory Visit: Payer: Medicare PPO | Admitting: Physical Therapy

## 2021-11-30 DIAGNOSIS — E119 Type 2 diabetes mellitus without complications: Secondary | ICD-10-CM | POA: Diagnosis not present

## 2021-11-30 DIAGNOSIS — B351 Tinea unguium: Secondary | ICD-10-CM

## 2021-11-30 DIAGNOSIS — M79675 Pain in left toe(s): Secondary | ICD-10-CM | POA: Diagnosis not present

## 2021-11-30 DIAGNOSIS — M79674 Pain in right toe(s): Secondary | ICD-10-CM | POA: Diagnosis not present

## 2021-11-30 DIAGNOSIS — M2010 Hallux valgus (acquired), unspecified foot: Secondary | ICD-10-CM

## 2021-11-30 MED ORDER — METHYLPHENIDATE HCL 5 MG PO TABS: 5.0000 mg | ORAL_TABLET | Freq: Two times a day (BID) | ORAL | 0 refills | Status: DC

## 2021-11-30 NOTE — Telephone Encounter (Signed)
Notified. 

## 2021-11-30 NOTE — Telephone Encounter (Signed)
Mrs Vankuren husband called for a refill on her methylphenidate.

## 2021-11-30 NOTE — Progress Notes (Signed)
This patient returns to my office for at risk foot care.  This patient requires this care by a professional since this patient will be at risk due to having type 2 diabetes.  This patient is unable to cut nails herself since the patient cannot reach her nails.These nails are painful walking and wearing shoes.  Patient has had a  CVA>  This patient presents for at risk foot care today.  General Appearance  Alert, conversant and in no acute stress.  Vascular  Dorsalis pedis and posterior tibial  pulses are palpable  bilaterally.  Capillary return is within normal limits  bilaterally. Temperature is within normal limits  bilaterally.  Neurologic  Senn-Weinstein monofilament wire test within normal limits  bilaterally. Muscle power within normal limits bilaterally.  Nails Thick disfigured discolored nails with subungual debris  from hallux to fifth toes bilaterally. No evidence of bacterial infection or drainage bilaterally.  Orthopedic  No limitations of motion  feet .  No crepitus or effusions noted.  HAV  B/L.  Hammer toes second  B/L.  Skin  normotropic skin with no porokeratosis noted bilaterally.  No signs of infections or ulcers noted.     Onychomycosis  Pain in right toes  Pain in left toes  Consent was obtained for treatment procedures.   Mechanical debridement of nails 1-5  bilaterally performed with a nail nipper.  Filed with dremel without incident.    Return office visit    10 weeks                Told patient to return for periodic foot care and evaluation due to potential at risk complications.   Gardiner Barefoot DPM

## 2021-12-02 ENCOUNTER — Encounter: Payer: Self-pay | Admitting: Physical Therapy

## 2021-12-02 ENCOUNTER — Ambulatory Visit: Payer: Medicare PPO | Admitting: Physical Therapy

## 2021-12-02 ENCOUNTER — Other Ambulatory Visit: Payer: Self-pay

## 2021-12-02 ENCOUNTER — Ambulatory Visit: Payer: Medicare PPO | Admitting: Occupational Therapy

## 2021-12-02 DIAGNOSIS — I69354 Hemiplegia and hemiparesis following cerebral infarction affecting left non-dominant side: Secondary | ICD-10-CM | POA: Diagnosis not present

## 2021-12-02 DIAGNOSIS — M79641 Pain in right hand: Secondary | ICD-10-CM

## 2021-12-02 DIAGNOSIS — R2681 Unsteadiness on feet: Secondary | ICD-10-CM

## 2021-12-02 DIAGNOSIS — M6281 Muscle weakness (generalized): Secondary | ICD-10-CM

## 2021-12-02 DIAGNOSIS — R2689 Other abnormalities of gait and mobility: Secondary | ICD-10-CM

## 2021-12-02 NOTE — Therapy (Signed)
[facility] [address] [place], [name], [address] Phone: [phone]   Fax:  [phone]  Physical Therapy Treatment  Patient Details  Name: Carolyn Russo MRN: [id number] Date of Birth: Feb 22, 1951 Referring Provider (PT): Courtney Heys, MD   Encounter Date: 12/02/2021   PT End of Session - 12/02/21 0942     Visit Number 9    Number of Visits 25    Date for PT Re-Evaluation 01/14/22    Authorization Type [employer] [place] submitted    Authorization Time Period 10/24/21- 01/14/22    Authorization - Visit Number 8    Authorization - Number of Visits 24    Progress Note Due on Visit 10    PT Start Time 0935    PT Stop Time 2883    PT Time Calculation (min) 40 min    Equipment Utilized During Treatment Gait belt    Activity Tolerance Patient tolerated treatment well    Behavior During Therapy WFL for tasks assessed/performed             Past Medical History:  Diagnosis Date   Acute GI bleeding    Allergy    Anemia    Anterior chest wall pain    Appendicitis 1965   Asthma    Body mass index 37.0-37.9, adult    Breast pain    Cataract    both eyes   CHF (congestive heart failure) ([place])    Chronic kidney disease    stage 5 - on dialysis   Cognitive change 04/20/2021   r/t cva 03/2821   Dehydration 2014   Deviated septum 1971   Diabetes mellitus    no meds   Dyspnea 2014   Extrinsic asthma    WITH ASTHMA ATTACK   Fibroid 1980   GERD (gastroesophageal reflux disease)    Heart murmur    Hx gestational diabetes    Hyperlipidemia    Hypertension 2014   Inguinal hernia 1959   Malaise and fatigue 2014   Non-IgE mediated allergic asthma 2014   Obesity    Pelvic pain    Pregnancy, high-risk 1985   Stroke ([place]) 04/20/2021   (CVA) of right basal ganglia   Tonsillitis 1968   Uterine fibroid 1980    Past Surgical History:  Procedure Laterality Date   APPENDECTOMY  3744   BASCILIC VEIN  TRANSPOSITION Right 04/30/2021   Procedure: RIGHT FIRST STAGE [place];  Surgeon: Angelia Mould, MD;  Location: [facility] OR;  Service: Vascular;  Laterality: Right;   CESAREAN SECTION  1985   COLONOSCOPY     ESOPHAGOGASTRODUODENOSCOPY (EGD) WITH PROPOFOL N/A 04/18/2021   Procedure: ESOPHAGOGASTRODUODENOSCOPY (EGD) WITH PROPOFOL;  Surgeon: Gatha Mayer, MD;  Location: [facility] ENDOSCOPY;  Service: Endoscopy;  Laterality: N/A;   EYE SURGERY     bilateral cataract    FLEXIBLE SIGMOIDOSCOPY N/A 04/18/2021   Procedure: FLEXIBLE SIGMOIDOSCOPY;  Surgeon: Gatha Mayer, MD;  Location: [facility] ENDOSCOPY;  Service: Endoscopy;  Laterality: N/A;   HERNIA REPAIR  1959   IR FLUORO GUIDE CV LINE RIGHT  03/18/2021   IR US GUIDE VASC ACCESS RIGHT  03/18/2021   LEFT HEART CATH AND CORONARY ANGIOGRAPHY N/A 04/04/2017   Procedure: Left Heart Cath and Coronary Angiography;  Surgeon: Belva Crome, MD;  Location: [facility];  Service: Cardiovascular;  Laterality: N/A;   MYOMECTOMY  1980, 2004, 2007   [place]  2003  SURGICAL REPAIR OF HEMORRHAGE  2015   TONSILLECTOMY  1968    There were no vitals filed for this visit.   Subjective Assessment - 12/02/21 0938     Subjective No new complaints. No falls. Some pain in right hand and right foot. Had bad spasms "contractions" in right LE last night and reports foot hurts now. Reports last session when they got home she took a nap. The CNA checked her sugar and it was low. She has since seen the Endocrinologist. They are working to get her a sensor so she can better monitior it. Has been ordered just not in yet, they are doing finger sticks in the meantime. CNA reports last time she had only had one piece of toast before coming to therapy, today she ate better before coming to therapy.    Patient is accompained by: --   CNA, Ms. Sherrod   Pertinent History ESRD with HD M-W-Fri:, obesity, DM type 2, CAD, iron deficiency  anemia, bronchial asthma, chronic diastolic CHF, Rt basal ganglia CVA on 04-16-21, steal syndrome RUE due to dialysis vascular access (s/p fistula placement), CKD stage V:  Rt hand weakness; Lt hemiparesis due to CVA    Patient Stated Goals "be independent with mobility and self care"; "be able to drive again"    Currently in Pain? Yes    Pain Score 6     Pain Location Wrist   and right foot   Pain Orientation Left    Pain Descriptors / Indicators Sore    Pain Type Acute pain;Chronic pain    Pain Onset More than a month ago    Pain Frequency Intermittent    Aggravating Factors  pressure on hand/wrist, cramping/spasms in right leg made foot sore, last night the foot cramped up too    Pain Relieving Factors rest, heat. was told to get "leg pain medicine" from [facility] for leg cramping                    [facility] Adult PT Treatment/Exercise - 12/02/21 0943       Transfers   Transfers Sit to Stand;Stand to Sit;Stand Pivot Transfers    Sit to Stand 5: Supervision;With upper extremity assist;From bed;From chair/3-in-1    Sit to Stand Details (indicate cue type and reason) cues for corrent hand placement for safety as pt tends to want to place both hands on walker    Stand to Sit 5: Supervision;With upper extremity assist;To bed;To chair/3-in-1    Stand to Sit Details cues to reach back for more controlled descent with sitting down      Ambulation/Gait   Ambulation/Gait Yes    Ambulation/Gait Assistance 4: Min guard    Ambulation/Gait Assistance Details cues for posture and walker position with gait.    Ambulation Distance (Feet) 110 Feet   x1, plus around clinic with session   Assistive device Rolling walker    Gait Pattern Step-through pattern;Decreased hip/knee flexion - left;Decreased step length - left    Ambulation Surface Level;Indoor      Knee/Hip Exercises: Aerobic   Other Aerobic Scifit UE/LE's level 4 x 8 minutes with goal >/= 80 steps per minute for strengthening and activity  tolerance.      Knee/Hip Exercises: Standing   Hip Flexion AROM;Stengthening;Both;1 set;10 reps;Knee bent;Limitations    Hip Flexion Limitations with 2# ankle weights alternating marching with cues to slow down, for posture and UE support on sturdy surface.    Hip Abduction AROM;Stengthening;Both;10 reps;Knee straight;Limitations  Abduction Limitations with 2# ankle weights, alternating sides with UE support on sturdy surface with cues on posture, and to slow down.      Knee/Hip Exercises: Seated   Long Arc Quad AROM;Strengthening;Both;1 set;15 reps;Weights;Limitations    Long Arc Quad Weight 2 lbs.    Long Arc Quad Limitations alteranting LE's with cues for slow movements    Other Seated Knee/Hip Exercises heel/toe raises with 2# ankle weight s for 20 reps.    Other Seated Knee/Hip Exercises seated clamshells with green band resistance for 10 reps with cues for controlled movements.    Hamstring Curl AROM;AAROM;Strengthening;Both;1 set;10 reps;Limitations    Hamstring Limitations use of red band resistance with cues/occasional assistance for controlled movements                  PT Short Term Goals - 11/23/21 1151       PT SHORT TERM GOAL #1   Title Improve TUG score from 32 secs to </= 27 secs with RW with SBA to demo improved functional mobility. (all STGs due 12-17-21)    Baseline 11/18/21: 30.69 sec's with RW, improved from 32 sec's just not to goals    Status Partially Met    Target Date 12/17/21      PT SHORT TERM GOAL #2   Title Pt will perform 5 times sit to stand transfers from standard chair with only 1 UE support with SBA to demo increased LE strength and balance. Update:  Perform sit to stand without UE support from mat for 3 reps to demo improved LE strength.    Baseline 11/18/21: 17.94 sec's with single UE support from standard height surface with retropulstion noted with each rep, min guard assist for safety    Status Revised    Target Date 12/17/21      PT  SHORT TERM GOAL #3   Title Increase Berg balance test score from 13/30 to >/= 19/30 to demo improved balance.  Updated STG; increase score to >/= 38/56    Baseline 11/18/21: 33/56 scored today    Status Revised    Target Date 12/17/21      PT SHORT TERM GOAL #4   Title Increase gait velocity by at least .4 ft/sec with RW to demo improved gait efficiency. Revised updated LTG:  increase gait velocity to >/= 2.0 ft/sec with RW    Baseline 11/02/21: 1.17 ft/sec with RW as baselne:    18.4, 18.82 = 1.74 ft/sec    Time 4    Period Weeks    Status Revised    Target Date 12/17/21      PT SHORT TERM GOAL #5   Title Amb. 19' with RW with SBA on flat, even surface for increased community accessibility.    Baseline 62' with RW due to fatigue -   11-23-21 = 190' with RW    Time 4    Period Weeks    Status On-going    Target Date 12/17/21      PT SHORT TERM GOAL #6   Title Independent with HEP for strengthening & balance exs.    Baseline 11/18/21: met with current program per pt and CNA    Status Achieved    Target Date 11/19/21               PT Long Term Goals - 11/17/21 1206       PT LONG TERM GOAL #1   Title Improve TUG score to </= 20 secs with RW with  supervision to reduce fall risk.    Baseline 32 secs with RW    Time 12    Period Weeks    Status New    Target Date 01/14/22      PT LONG TERM GOAL #2   Title Pt will amb. 500' with RW with SBA on flat, even surface for increased community accessibility.    Baseline 32' with RW    Time 12    Period Weeks    Status New    Target Date 01/14/22      PT LONG TERM GOAL #3   Title Increase Berg balance test score from 13/56 to >/= 28/56 to reduce fall risk.    Time 12    Period Weeks    Status New    Target Date 01/14/22      PT LONG TERM GOAL #4   Title Increase gait velocity by at least 1.0 ft/sec with RW for incr. gait efficiency.    Time 12    Period Weeks    Status New    Target Date 01/14/22      PT LONG TERM  GOAL #5   Title Perform sit to stand from chair without UE support to demo improved LE strength and balance.    Time 12    Period Weeks    Status New    Target Date 01/14/22      PT LONG TERM GOAL #6   Title Negotiate 4 steps with 1 hand rail using step by step sequence with SBA.    Time 12    Period Weeks    Status New    Target Date 01/14/22      PT LONG TERM GOAL #7   Title Independent in updated HEP for balance & strengthening.    Time 12    Period Weeks    Status New    Target Date 01/14/22                   Plan - 12/02/21 0942     Clinical Impression Statement Today's skilled session continued to focus on strengthening, gait with walker and working on increased activity tolernace. No issues noted or reported in session. The pt was more awake this session and able to fully participate. The pt should benefit from continued PT to progress toward unmet goals.    Personal Factors and Comorbidities Fitness;Comorbidity 2;Behavior Pattern    Comorbidities CAD, ESRD on HD M-W-Fri, CKD stage V, iron deficiency anemia, DM type 2, bronchial asthma, chronic diastolic CHF, s/p Rt basal ganglia CVA, steal syndrome RUE with fistula implant    Examination-Activity Limitations Bathing;Bed Mobility;Locomotion Level;Transfers;Reach Overhead;Squat;Stairs;Stand;Toileting;Hygiene/Grooming;Bend;Carry    Examination-Participation Restrictions Cleaning;Community Activity;Driving;Interpersonal Relationship;Laundry;Meal Prep;Shop    Stability/Clinical Decision Making Evolving/Moderate complexity    Rehab Potential Good    PT Frequency 2x / week    PT Duration 12 weeks    PT Treatment/Interventions ADLs/Self Care Home Management;DME Instruction;Gait training;Stair training;Therapeutic activities;Therapeutic exercise;Balance training;Neuromuscular re-education;Patient/family education    PT Next Visit Plan 10th visit progress note due. continue with use of scifit/nustep for  strengthening/activity tolerance; continue to address strengthening and balance;    PT Home Exercise Plan Access Code: [email or web address]    Consulted and Agree with Plan of Care Patient   CNA - Ms. Sherrod            Patient will benefit from skilled therapeutic intervention in order to improve the following deficits and impairments:  Abnormal gait, Decreased activity tolerance,  Decreased balance, Decreased strength, Decreased endurance, Decreased range of motion, Impaired sensation, Impaired UE functional use  Visit Diagnosis: Hemiplegia and hemiparesis following cerebral infarction affecting left non-dominant side (HCC)  Muscle weakness (generalized)  Unsteadiness on feet  Other abnormalities of gait and mobility     Problem List Patient Active Problem List   Diagnosis Date Noted   Left hemiparesis ([place]) 09/24/2021   Abnormality of gait 09/24/2021   Pain of right hand 08/06/2021   Steal syndrome of dialysis vascular access ([place]) 08/06/2021   Subclavian steal syndrome of right subclavian artery 06/25/2021   Right hand weakness 06/25/2021   Stroke-like symptoms 05/06/2021   Bandemia    Cerebrovascular accident (CVA) of right basal ganglia ([place]) 04/20/2021   External hemorrhoids    Basal ganglia infarction ([place]) 04/16/2021   ESRD on dialysis ([place]) 04/16/2021   Hypertensive urgency 03/10/2021   Hilar enlargement    Anemia of chronic disease    Chronic diastolic CHF (congestive heart failure) ([place]) 03/09/2021   CKD (chronic kidney disease), stage V ([place]) 03/08/2021   Diabetic retinopathy ([place]) 03/08/2021   Hyperglycemia due to type 2 diabetes mellitus ([place]) 03/08/2021   Pure hypercholesterolemia 03/08/2021   Anemia in chronic kidney disease 09/24/2020   Uncontrolled type 2 diabetes mellitus with hyperglycemia ([place]) 03/19/2019   Osteopenia 09/04/2018   Migraine 09/04/2018   Asthma 09/04/2018   Pseudophakia of both eyes 07/17/2018   Pseudophakia of left eye 07/03/2018   Open  angle with borderline findings and high glaucoma risk in left eye 07/03/2018   Age-related nuclear cataract of right eye 07/03/2018   CAD (coronary artery disease), native coronary artery 04/27/2017   Abnormal nuclear stress test 04/04/2017   Dyspnea on exertion 03/09/2017   Appendicitis    Age-related hypermature cataract of both eyes 12/20/2016   Uterine leiomyoma 11/27/2012   Dyslipidemia (high LDL; low HDL) 11/25/2011   Obesity (BMI 30-39.9) 11/25/2011   Hypertensive disorder 11/21/2011   Type 2 diabetes mellitus ([place]) 11/21/2011   Abnormal EKG 11/21/2011    Willow Ora, PTA, [facility] [address] [place],  [address] [phone] 12/02/21, 11:27 AM   Name: Carolyn Russo MRN: [id number] Date of Birth: 1951-01-13

## 2021-12-02 NOTE — Patient Instructions (Signed)
MP Flexion (Active)    With taco splint ON, bend large knuckles as far as they will go, keeping small joints straight. Repeat _15___ times. Do __3__ sessions per day. Take off splint when not doing this exercise. May need to slide splint down a couple times during exercise as it will slide up some

## 2021-12-02 NOTE — Therapy (Signed)
[facility] [address] [place], [name], [address] Phone: [phone]   Fax:  [phone]  Occupational Therapy Treatment  Patient Details  Name: Carolyn Russo MRN: [id number] Date of Birth: Dec 04, 1950 Referring Provider (OT): Dr. Dagoberto Ligas   Encounter Date: 12/02/2021   OT End of Session - 12/02/21 1224     Visit Number 9    Number of Visits 25    Date for OT Re-Evaluation 01/13/22    Authorization Type Medicare, State BCBS    Authorization - Visit Number 9    Authorization - Number of Visits 10    Progress Note Due on Visit 10    OT Start Time 1020    OT Stop Time 1100    OT Time Calculation (min) 40 min    Activity Tolerance Patient tolerated treatment well    Behavior During Therapy [facility] for tasks assessed/performed             Past Medical History:  Diagnosis Date   Acute GI bleeding    Allergy    Anemia    Anterior chest wall pain    Appendicitis 1965   Asthma    Body mass index 37.0-37.9, adult    Breast pain    Cataract    both eyes   CHF (congestive heart failure) ([place])    Chronic kidney disease    stage 5 - on dialysis   Cognitive change 04/20/2021   r/t cva 03/2821   Dehydration 2014   Deviated septum 1971   Diabetes mellitus    no meds   Dyspnea 2014   Extrinsic asthma    WITH ASTHMA ATTACK   Fibroid 1980   GERD (gastroesophageal reflux disease)    Heart murmur    Hx gestational diabetes    Hyperlipidemia    Hypertension 2014   Inguinal hernia 1959   Malaise and fatigue 2014   Non-IgE mediated allergic asthma 2014   Obesity    Pelvic pain    Pregnancy, high-risk 1985   Stroke ([place]) 04/20/2021   (CVA) of right basal ganglia   Tonsillitis 1968   Uterine fibroid 1980    Past Surgical History:  Procedure Laterality Date   APPENDECTOMY  9024   BASCILIC VEIN TRANSPOSITION Right 04/30/2021   Procedure: RIGHT FIRST STAGE [place];  Surgeon: Angelia Mould, MD;  Location[facility] OR;  Service: Vascular;  Laterality: Right;   CESAREAN SECTION  1985   COLONOSCOPY     ESOPHAGOGASTRODUODENOSCOPY (EGD) WITH PROPOFOL N/A 04/18/2021   Procedure: ESOPHAGOGASTRODUODENOSCOPY (EGD) WITH PROPOFOL;  Surgeon: Gatha Mayer, MD;  Location: [facility] ENDOSCOPY;  Service: Endoscopy;  Laterality: N/A;   EYE SURGERY     bilateral cataract    FLEXIBLE SIGMOIDOSCOPY N/A 04/18/2021   Procedure: FLEXIBLE SIGMOIDOSCOPY;  Surgeon: Gatha Mayer, MD;  Location: [facility] ENDOSCOPY;  Service: Endoscopy;  Laterality: N/A;   HERNIA REPAIR  1959   IR FLUORO GUIDE CV LINE RIGHT  03/18/2021   IR US GUIDE VASC ACCESS RIGHT  03/18/2021   LEFT HEART CATH AND CORONARY ANGIOGRAPHY N/A 04/04/2017   Procedure: Left Heart Cath and Coronary Angiography;  Surgeon: Belva Crome, MD;  Location: [facility] CV LAB;  Service: Cardiovascular;  Laterality: N/A;   MYOMECTOMY  1980, 2004, 2007   RHINOPLASTY  1971   ROTATOR CUFF REPAIR  2003   SURGICAL REPAIR OF HEMORRHAGE  2015   TONSILLECTOMY  1968    There were no vitals  filed for this visit.   Subjective Assessment - 12/02/21 1017     Subjective  My blood sugar was too low and I was dehydrated; that's why I had to cancel last time    Pertinent History Carolyn Russo is a 71 y.o. female with PMHx of obesity, DM, HTN, HLD, ESRD on HD who presented with left sided weakness for 3 days on 04/16/2021. Pt was diagnosed with R basal ganglia infarct Pt went to rehab but then transferred back to acute service on 7/13 for likely seizure. Pt returned to CIR and was d/c home on 05/27/21. Pt received HHOT and PT. Pt has R hand weakness s/p fistula placement. PMH: diabetes, hypertension, hyperlipidemia, ESRD on HD MWF.    Patient Stated Goals to be like I was prior to my stroke    Currently in Pain? Yes    Pain Score 6     Pain Location Wrist    Pain Orientation Right    Pain Descriptors / Indicators Sore    Pain Type Chronic pain    Pain Onset  More than a month ago    Pain Frequency Intermittent    Aggravating Factors  pressure on hand/wrist    Pain Relieving Factors rest, heat             Fabricated and fitted exercise splint (taco style) to keep IP joints of fingers straight while performing active MP flexion for intrinsic + movement and to help prevent further clawing. Issued splint and reviewed only to wear when doing this exercise.   AA/ROM in LUE flexion and abduction using UE Ranger in gravity elim plane, then along inclined surface for higher sh flexion.  BUE AA/ROM into gravity and along gravity elim plane                    OT Education - 12/02/21 1040     Education Details MP flexion ex with taco splint on    Person(s) Educated Patient;Caregiver(s)    Methods Explanation;Demonstration;Verbal cues;Handout    Comprehension Verbalized understanding;Returned demonstration;Verbal cues required              OT Short Term Goals - 12/02/21 1226       OT SHORT TERM GOAL #1   Title I with initial HEP    Time 4    Period Weeks    Status Achieved    Target Date 11/18/21      OT SHORT TERM GOAL #2   Title Pt will donn shirt  and bathe UB with min A consistently    Time 4    Period Weeks    Status On-going      OT SHORT TERM GOAL #3   Title Pt will be I with bilateral UE positioning including splinting  to minimize pain and risk for contracture and injury.    Time 4    Period Weeks    Status On-going      OT SHORT TERM GOAL #4   Title Pt will demonstrate 60* LUE shoulder flexion in prep for functional reach    Time 4    Period Weeks    Status On-going      OT SHORT TERM GOAL #5   Title Pt will demonstrate improved bilateral UE functional use as eveidenced by increasing bilateral box/ blocks score by 3 blocks.    Time 4    Period Weeks    Status On-going  OT Long Term Goals - 10/21/21 1350       OT LONG TERM GOAL #1   Title Pt will perform UB dressing with  setup and LB dressing with min A.    Time 12    Period Weeks    Status New      OT LONG TERM GOAL #2   Title Pt will bathe herself with min A.    Time 12    Period Weeks    Status New      OT LONG TERM GOAL #3   Title Pt will perform simple home management/ cooking tasks with min A.    Time 12    Period Weeks    Status New      OT LONG TERM GOAL #4   Title Pt will retrieve a lightweight object at 75* shoulder flexion with LUE.    Time 12    Period Weeks    Status New      OT LONG TERM GOAL #5   Title Pt will increase RUE grip strength to 15 lbs for increased functional use.    Time 12    Period Weeks    Status New      OT LONG TERM GOAL #6   Title Pt will demonstrate ability to write her name and address with 100% legibility using AE prn.    Time 12    Period Weeks    Status New      OT LONG TERM GOAL #7   Title Pt will utilize RUE at least 75% of the time for ADLS/ IADLS with pain no greater than 4/10.    Time 12    Period Weeks    Status New                   Plan - 12/02/21 1227     Clinical Impression Statement Pt continues to have drowsiness during 2nd therapy session. Pt slowly progressing towards goals    OT Occupational Profile and History Detailed Assessment- Review of Records and additional review of physical, cognitive, psychosocial history related to current functional performance    Occupational performance deficits (Please refer to evaluation for details): ADL's;IADL's;Play;Social Participation;Rest and Sleep    Body Structure / Function / Physical Skills ADL;Endurance;UE functional use;Balance;Flexibility;Pain;FMC;ROM;Gait;Coordination;GMC;Sensation;Decreased knowledge of precautions;Decreased knowledge of use of DME;IADL;Dexterity;Strength;Mobility    Rehab Potential Good    OT Frequency 2x / week    OT Duration 12 weeks    OT Treatment/Interventions Self-care/ADL training;Ultrasound;DME and/or AE instruction;Patient/family  education;Balance training;Passive range of motion;Paraffin;Cryotherapy;Fluidtherapy;Splinting;Functional Mobility Training;Electrical Stimulation;Moist Heat;Therapeutic exercise;Manual Therapy;Therapeutic activities;Neuromuscular education    Plan 10th progress note, assess STG's    Consulted and Agree with Plan of Care Patient;Other (Comment)   hired caregiver briefly, pt's husband            Patient will benefit from skilled therapeutic intervention in order to improve the following deficits and impairments:   Body Structure / Function / Physical Skills: ADL, Endurance, UE functional use, Balance, Flexibility, Pain, FMC, ROM, Gait, Coordination, GMC, Sensation, Decreased knowledge of precautions, Decreased knowledge of use of DME, IADL, Dexterity, Strength, Mobility       Visit Diagnosis: Hemiplegia and hemiparesis following cerebral infarction affecting left non-dominant side (HCC)  Muscle weakness (generalized)  Pain in right hand    Problem List Patient Active Problem List   Diagnosis Date Noted   Left hemiparesis ([place]) 09/24/2021   Abnormality of gait 09/24/2021   Pain of right hand 08/06/2021  Steal syndrome of dialysis vascular access ([place]) 08/06/2021   Subclavian steal syndrome of right subclavian artery 06/25/2021   Right hand weakness 06/25/2021   Stroke-like symptoms 05/06/2021   Bandemia    Cerebrovascular accident (CVA) of right basal ganglia ([place]) 04/20/2021   External hemorrhoids    Basal ganglia infarction ([place]) 04/16/2021   ESRD on dialysis ([place]) 04/16/2021   Hypertensive urgency 03/10/2021   Hilar enlargement    Anemia of chronic disease    Chronic diastolic CHF (congestive heart failure) ([place]) 03/09/2021   CKD (chronic kidney disease), stage V ([place]) 03/08/2021   Diabetic retinopathy ([place]) 03/08/2021   Hyperglycemia due to type 2 diabetes mellitus ([place]) 03/08/2021   Pure hypercholesterolemia 03/08/2021   Anemia in chronic kidney disease 09/24/2020    Uncontrolled type 2 diabetes mellitus with hyperglycemia ([place]) 03/19/2019   Osteopenia 09/04/2018   Migraine 09/04/2018   Asthma 09/04/2018   Pseudophakia of both eyes 07/17/2018   Pseudophakia of left eye 07/03/2018   Open angle with borderline findings and high glaucoma risk in left eye 07/03/2018   Age-related nuclear cataract of right eye 07/03/2018   CAD (coronary artery disease), native coronary artery 04/27/2017   Abnormal nuclear stress test 04/04/2017   Dyspnea on exertion 03/09/2017   Appendicitis    Age-related hypermature cataract of both eyes 12/20/2016   Uterine leiomyoma 11/27/2012   Dyslipidemia (high LDL; low HDL) 11/25/2011   Obesity (BMI 30-39.9) 11/25/2011   Hypertensive disorder 11/21/2011   Type 2 diabetes mellitus ([place]) 11/21/2011   Abnormal EKG 11/21/2011    Carey Bullocks, OTR/L 12/02/2021, 12:29 PM  [facility] [address] [place], [name], [address] Phone: [phone]   Fax:  [phone]  Name: Carolyn Russo MRN: [id number] Date of Birth: 1951/10/05

## 2021-12-07 ENCOUNTER — Telehealth: Payer: Self-pay

## 2021-12-07 ENCOUNTER — Ambulatory Visit: Payer: Medicare PPO | Admitting: Physical Therapy

## 2021-12-07 ENCOUNTER — Other Ambulatory Visit: Payer: Self-pay

## 2021-12-07 ENCOUNTER — Ambulatory Visit: Payer: Medicare PPO | Admitting: Occupational Therapy

## 2021-12-07 DIAGNOSIS — I69354 Hemiplegia and hemiparesis following cerebral infarction affecting left non-dominant side: Secondary | ICD-10-CM | POA: Diagnosis not present

## 2021-12-07 DIAGNOSIS — M79641 Pain in right hand: Secondary | ICD-10-CM

## 2021-12-07 DIAGNOSIS — M6281 Muscle weakness (generalized): Secondary | ICD-10-CM

## 2021-12-07 DIAGNOSIS — M25512 Pain in left shoulder: Secondary | ICD-10-CM

## 2021-12-07 DIAGNOSIS — R208 Other disturbances of skin sensation: Secondary | ICD-10-CM

## 2021-12-07 DIAGNOSIS — R2689 Other abnormalities of gait and mobility: Secondary | ICD-10-CM

## 2021-12-07 DIAGNOSIS — R2681 Unsteadiness on feet: Secondary | ICD-10-CM

## 2021-12-07 DIAGNOSIS — R278 Other lack of coordination: Secondary | ICD-10-CM

## 2021-12-07 NOTE — Therapy (Signed)
[facility] [address] [place], [name], [address] Phone: [phone]   Fax:  [phone]  Occupational Therapy Treatment  Patient Details  Name: Carolyn Russo MRN: [id number] Date of Birth: 11-20-1950 Referring Provider (OT): Dr. Dagoberto Ligas   Encounter Date: 12/07/2021   OT End of Session - 12/07/21 1217     Visit Number 10    Number of Visits 25    Date for OT Re-Evaluation 01/13/22    Authorization Type Medicare, State BCBS    Authorization - Visit Number 10    Authorization - Number of Visits 10    Progress Note Due on Visit 10    OT Start Time 1104    OT Stop Time 1145    OT Time Calculation (min) 41 min             Past Medical History:  Diagnosis Date   Acute GI bleeding    Allergy    Anemia    Anterior chest wall pain    Appendicitis 1965   Asthma    Body mass index 37.0-37.9, adult    Breast pain    Cataract    both eyes   CHF (congestive heart failure) ([place])    Chronic kidney disease    stage 5 - on dialysis   Cognitive change 04/20/2021   r/t cva 03/2821   Dehydration 2014   Deviated septum 1971   Diabetes mellitus    no meds   Dyspnea 2014   Extrinsic asthma    WITH ASTHMA ATTACK   Fibroid 1980   GERD (gastroesophageal reflux disease)    Heart murmur    Hx gestational diabetes    Hyperlipidemia    Hypertension 2014   Inguinal hernia 1959   Malaise and fatigue 2014   Non-IgE mediated allergic asthma 2014   Obesity    Pelvic pain    Pregnancy, high-risk 1985   Stroke ([place]) 04/20/2021   (CVA) of right basal ganglia   Tonsillitis 1968   Uterine fibroid 1980    Past Surgical History:  Procedure Laterality Date   APPENDECTOMY  5449   BASCILIC VEIN TRANSPOSITION Right 04/30/2021   Procedure: RIGHT FIRST STAGE [place];  Surgeon: Angelia Mould, MD;  Location: [facility] OR;  Service: Vascular;  Laterality: Right;   CESAREAN SECTION  1985   COLONOSCOPY      ESOPHAGOGASTRODUODENOSCOPY (EGD) WITH PROPOFOL N/A 04/18/2021   Procedure: ESOPHAGOGASTRODUODENOSCOPY (EGD) WITH PROPOFOL;  Surgeon: Gatha Mayer, MD;  Location: [facility] ENDOSCOPY;  Service: Endoscopy;  Laterality: N/A;   EYE SURGERY     bilateral cataract    FLEXIBLE SIGMOIDOSCOPY N/A 04/18/2021   Procedure: FLEXIBLE SIGMOIDOSCOPY;  Surgeon: Gatha Mayer, MD;  Location: [facility] ENDOSCOPY;  Service: Endoscopy;  Laterality: N/A;   HERNIA REPAIR  1959   IR FLUORO GUIDE CV LINE RIGHT  03/18/2021   IR US GUIDE VASC ACCESS RIGHT  03/18/2021   LEFT HEART CATH AND CORONARY ANGIOGRAPHY N/A 04/04/2017   Procedure: Left Heart Cath and Coronary Angiography;  Surgeon: Belva Crome, MD;  Location: [facility] CV LAB;  Service: Cardiovascular;  Laterality: N/A;   MYOMECTOMY  1980, 2004, 2007   RHINOPLASTY  1971   ROTATOR CUFF REPAIR  2003   SURGICAL REPAIR OF HEMORRHAGE  2015   TONSILLECTOMY  1968    There were no vitals filed for this visit.   Subjective Assessment - 12/07/21 1216     Subjective  Pt requests  to go to the bathroom first    Pertinent History Carolyn Russo is a 71 y.o. female with PMHx of obesity, DM, HTN, HLD, ESRD on HD who presented with left sided weakness for 3 days on 04/16/2021. Pt was diagnosed with R basal ganglia infarct Pt went to rehab but then transferred back to acute service on 7/13 for likely seizure. Pt returned to CIR and was d/c home on 05/27/21. Pt received HHOT and PT. Pt has R hand weakness s/p fistula placement. PMH: diabetes, hypertension, hyperlipidemia, ESRD on HD MWF.    Patient Stated Goals to be like I was prior to my stroke    Currently in Pain? Yes    Pain Score 5     Pain Location Hand    Pain Orientation Right    Pain Descriptors / Indicators Sore    Pain Type Chronic pain    Pain Onset More than a month ago    Pain Frequency Intermittent    Aggravating Factors  pressue    Pain Relieving Factors rest, inactivity                   Treatment:  Pt ambulated to BR with RW and minguard, min v.c for hand placement, pt was able to pull pants down with supervision, pt performed hygiene without assist.  after toileting pt require min A to fully get her pants back up, supervision while washing hands supine closed chain chest press and shoulder flexion min v.c and facilitation Seated weightbearing through bilateral UE's then functional reaching to place large pegs into pegboard with bilateral UE's min-mod difficulty and facilitation Pt practiced self feeding with spoon and fork using foam grips, min difficulty/ facilitation. Pt practiced with rocker knife using RUE however she does not demonstrate adequate strength and control yet to use. Pt was accompanied by her husband. She was encouraged to use RUE to desensitize it.                 OT Short Term Goals - 12/07/21 1220       OT SHORT TERM GOAL #1   Title I with initial HEP    Time 4    Period Weeks    Status Achieved    Target Date 11/18/21      OT SHORT TERM GOAL #2   Title Pt will donn shirt  and bathe UB with min A consistently    Time 4    Period Weeks    Status On-going   not consistent     OT SHORT TERM GOAL #3   Title Pt will be I with bilateral UE positioning including splinting  to minimize pain and risk for contracture and injury.    Time 4    Period Weeks    Status Partially Met   splints have been issued and pt/ caregiver education performed- pt needs reinforcement     OT SHORT TERM GOAL #4   Title Pt will demonstrate 60* LUE shoulder flexion in prep for functional reach    Time 4    Period Weeks    Status On-going   55*     OT SHORT TERM GOAL #5   Title Pt will demonstrate improved bilateral UE functional use as eveidenced by increasing bilateral box/ blocks score by 3 blocks.    Time 4    Period Weeks    Status On-going               OT  Long Term Goals - 10/21/21 1350       OT LONG TERM GOAL #1   Title Pt will perform UB dressing  with setup and LB dressing with min A.    Time 12    Period Weeks    Status New      OT LONG TERM GOAL #2   Title Pt will bathe herself with min A.    Time 12    Period Weeks    Status New      OT LONG TERM GOAL #3   Title Pt will perform simple home management/ cooking tasks with min A.    Time 12    Period Weeks    Status New      OT LONG TERM GOAL #4   Title Pt will retrieve a lightweight object at 75* shoulder flexion with LUE.    Time 12    Period Weeks    Status New      OT LONG TERM GOAL #5   Title Pt will increase RUE grip strength to 15 lbs for increased functional use.    Time 12    Period Weeks    Status New      OT LONG TERM GOAL #6   Title Pt will demonstrate ability to write her name and address with 100% legibility using AE prn.    Time 12    Period Weeks    Status New      OT LONG TERM GOAL #7   Title Pt will utilize RUE at least 75% of the time for ADLS/ IADLS with pain no greater than 4/10.    Time 12    Period Weeks    Status New                   Plan - 12/07/21 1218     Clinical Impression Statement For the reporting period of 10/21/21- 12/07/21, pt has made progress towards goals however she has not fully achieved goals due to the severity of her deficits and bilateral UE involvement.Pt is progressing towards short and long term goals.  Pt can benefit from continued skilled occupational therapy to address the following deficits: bilateral UE weakness, pain , decreased UE functional use, visual deficits decreased functional mobility, decreased balance, cognitive deficits which impedes pt's I with ADLS/IADLs. Pt can benefit from continued skilled occupational therapy in order to maximize pt's safety and I with daily activities.    OT Occupational Profile and History Detailed Assessment- Review of Records and additional review of physical, cognitive, psychosocial history related to current functional performance    Occupational performance  deficits (Please refer to evaluation for details): ADL's;IADL's;Play;Social Participation;Rest and Sleep    Body Structure / Function / Physical Skills ADL;Endurance;UE functional use;Balance;Flexibility;Pain;FMC;ROM;Gait;Coordination;GMC;Sensation;Decreased knowledge of precautions;Decreased knowledge of use of DME;IADL;Dexterity;Strength;Mobility    Rehab Potential Good    OT Frequency 2x / week    OT Duration 12 weeks    OT Treatment/Interventions Self-care/ADL training;Ultrasound;DME and/or AE instruction;Patient/family education;Balance training;Passive range of motion;Paraffin;Cryotherapy;Fluidtherapy;Splinting;Functional Mobility Training;Electrical Stimulation;Moist Heat;Therapeutic exercise;Manual Therapy;Therapeutic activities;Neuromuscular education    Plan Check box/ blocks, NMR, Ue functional use, check on self feeding    Consulted and Agree with Plan of Care Patient   husband            Patient will benefit from skilled therapeutic intervention in order to improve the following deficits and impairments:   Body Structure / Function / Physical Skills: ADL, Endurance, UE functional use, Balance, Flexibility,  Pain, FMC, ROM, Gait, Coordination, GMC, Sensation, Decreased knowledge of precautions, Decreased knowledge of use of DME, IADL, Dexterity, Strength, Mobility       Visit Diagnosis: Hemiplegia and hemiparesis following cerebral infarction affecting left non-dominant side (HCC)  Muscle weakness (generalized)  Pain in right hand  Unsteadiness on feet  Acute pain of left shoulder  Other lack of coordination  Other abnormalities of gait and mobility  Other disturbances of skin sensation    Problem List Patient Active Problem List   Diagnosis Date Noted   Left hemiparesis ([place]) 09/24/2021   Abnormality of gait 09/24/2021   Pain of right hand 08/06/2021   Steal syndrome of dialysis vascular access ([place]) 08/06/2021   Subclavian steal syndrome of right subclavian  artery 06/25/2021   Right hand weakness 06/25/2021   Stroke-like symptoms 05/06/2021   Bandemia    Cerebrovascular accident (CVA) of right basal ganglia ([place]) 04/20/2021   External hemorrhoids    Basal ganglia infarction ([place]) 04/16/2021   ESRD on dialysis ([place]) 04/16/2021   Hypertensive urgency 03/10/2021   Hilar enlargement    Anemia of chronic disease    Chronic diastolic CHF (congestive heart failure) ([place]) 03/09/2021   CKD (chronic kidney disease), stage V ([place]) 03/08/2021   Diabetic retinopathy ([place]) 03/08/2021   Hyperglycemia due to type 2 diabetes mellitus ([place]) 03/08/2021   Pure hypercholesterolemia 03/08/2021   Anemia in chronic kidney disease 09/24/2020   Uncontrolled type 2 diabetes mellitus with hyperglycemia ([place]) 03/19/2019   Osteopenia 09/04/2018   Migraine 09/04/2018   Asthma 09/04/2018   Pseudophakia of both eyes 07/17/2018   Pseudophakia of left eye 07/03/2018   Open angle with borderline findings and high glaucoma risk in left eye 07/03/2018   Age-related nuclear cataract of right eye 07/03/2018   CAD (coronary artery disease), native coronary artery 04/27/2017   Abnormal nuclear stress test 04/04/2017   Dyspnea on exertion 03/09/2017   Appendicitis    Age-related hypermature cataract of both eyes 12/20/2016   Uterine leiomyoma 11/27/2012   Dyslipidemia (high LDL; low HDL) 11/25/2011   Obesity (BMI 30-39.9) 11/25/2011   Hypertensive disorder 11/21/2011   Type 2 diabetes mellitus ([place]) 11/21/2011   Abnormal EKG 11/21/2011    Eugen Jeansonne, OT 12/07/2021, 1:22 PM  [facility] [address] [place], [name], [address] Phone: [phone]   Fax:  [phone]  Name: Carolyn Russo MRN: [id number] Date of Birth: [birth year]

## 2021-12-07 NOTE — Chronic Care Management (AMB) (Signed)
Chronic Care Management Pharmacy Assistant   Name: Carolyn Russo  MRN: [id number] DOB: [birth year]  Reason for Encounter: Disease State/ Hypertension  Recent office visits:  None  Recent consult visits:  12-02-2021 Carey Bullocks, OT (Occupational therapy). Therapy for muscle weakness and pain in right hand.  12-02-2021 Drema Balzarine, PTA (Physical therapy). Therapy for Muscle weakness, Unsteadiness on feet and other abnormalities of gait and mobility.  11-30-2021 Gardiner Barefoot, DPM (Podiatry). Mechanical debridement of nails 1-5  bilaterally performed with a nail nipper. Filed with dremel without incident. Follow up in 10 weeks.  11-25-2021 Drema Balzarine, PTA (Physical therapy). Therapy for Muscle weakness, Unsteadiness on feet and other abnormalities of gait and mobility.  11-25-2021 Carey Bullocks, OT (Occupational therapy). Therapy for muscle weakness and other lack of coordination.  11-23-2021 Guido Sander, PT (Physical therapy). Therapy for Muscle weakness, Unsteadiness on feet and other abnormalities of gait and mobility.  11-18-2021 Drema Balzarine, PTA (Rehab). Physical therapy for Muscle weakness (generalized), Unsteadiness on feet and Other abnormalities of gait and mobility.  11-18-2021 Delton Prairie, OT (Rehab). Occupational Therapy for: Muscle weakness (generalized) Other lack of coordination Unsteadiness on feet Acute pain of left shoulder Pain in right hand Other disturbances of skin sensation  11-16-2021 Rine, Selmer Dominion, OT (Rehab). Occupational Therapy for: Muscle weakness (generalized) Other lack of coordination Unsteadiness on feet Acute pain of left shoulder Pain in right hand Other disturbances of skin sensation   11-16-2021 Guido Sander, PT (Rehab). Physical therapy for: Other abnormalities of gait and mobility Unsteadiness on feet   11-11-2021 Rine, Selmer Dominion, OT (Rehab). Occupational Therapy for: Muscle weakness  (generalized) Other lack of coordination Unsteadiness on feet Acute pain of left shoulder Pain in right hand Other disturbances of skin sensation   11-11-2021 Drema Balzarine, PTA (Rehab). Physical therapy for: Muscle weakness (generalized) Unsteadiness on feet Other abnormalities of gait and mobility    11-09-2021  Drema Balzarine, PTA (Rehab). Physical therapy for: Muscle weakness (generalized) Unsteadiness on feet Other abnormalities of gait and mobility   11-09-2021 Carey Bullocks, OT (Rehab). Follow up on splints.   11-04-2021 Hulda Marin, OT,  (Rehab). Visit withheld due to patient elevated blood pressure of 223/94.   11-02-2021 Rine, Selmer Dominion, OT (Rehab). Occupational Therapy for: Unsteadiness on feet Other lack of coordination Acute pain of left shoulder Pain in right hand Hemiplegia and hemiparesis following cerebral infarction affecting left non-dominant side (HCC) Other disturbances of skin sensation   11-02-2021 Drema Balzarine, PTA (Rehab). Physical therapy for: Muscle weakness (generalized) Unsteadiness on feet Other abnormalities of gait and mobility  11-02-2021 Rine, Selmer Dominion, OT (Rehab). Occupational Therapy for: Unsteadiness on feet Other lack of coordination Acute pain of left shoulder Pain in right hand Hemiplegia and hemiparesis following cerebral infarction affecting left non-dominant side (HCC) Other disturbances of skin sensation  Hospital visits:  Medication Reconciliation was completed by comparing discharge summary, patients EMR and Pharmacy list, and upon discussion with patient.   Admitted to the hospital on 11-03-2021 due to Facial cellulitis. Discharge date was 11-04-2021 Discharged from [facility] Emergency Dept.   New?Medications Started at [facility] Discharge:?? Clindamycin 300 mg 4 times daily, X 7 days   Medication Changes at Hospital Discharge: None   Medications Discontinued at Hospital Discharge: None    Medications that remain the same after Hospital Discharge:??  -All other medications will remain the same.    Medications: Outpatient Encounter Medications as of 12/07/2021  Medication Sig   acetaminophen (OFIRMEV) 10 MG/ML SOLN Take by mouth.   albuterol (VENTOLIN HFA) 108 (90 Base) MCG/ACT inhaler Inhale 2 puffs into the lungs every 4 (four) hours as needed for wheezing or shortness of breath.   BESIVANCE 0.6 % SUSP Place 1 drop into both eyes See admin instructions. After  eye injections   carvedilol (COREG) 3.125 MG tablet Take 1 tablet (3.125 mg total) by mouth 2 (two) times daily with a meal.   cephALEXin (KEFLEX) 250 MG capsule Take 1 capsule (250 mg total) by mouth every 12 (twelve) hours. Be sure to dose after dialysis on dialysis days   clindamycin (CLEOCIN) 300 MG capsule Take 1 capsule (300 mg total) by mouth 4 (four) times daily. X 7 days   clopidogrel (PLAVIX) 75 MG tablet Take 1 tablet (75 mg total) by mouth daily.   Continuous Blood Gluc Receiver (FREESTYLE LIBRE 2 READER) DEVI by Does not apply route.   diclofenac Sodium (VOLTAREN) 1 % GEL Apply 2 g topically 4 (four) times daily. To left wrist   diphenhydrAMINE (BENADRYL) 25 MG tablet Take 25 mg by mouth every 6 (six) hours as needed for itching.   fluticasone (FLONASE) 50 MCG/ACT nasal spray Place 2 sprays into the nose daily as needed for allergies.    hydrALAZINE (APRESOLINE) 10 MG tablet Take 1 tablet (10 mg total) by mouth every 8 (eight) hours as needed (Sys BP >180, hold on HD days).   hydrocortisone (ANUSOL-HC) 2.5 % rectal cream Place 1 application rectally 2 (two) times daily as needed for hemorrhoids or anal itching.   hydrocortisone cream 1 % Apply to affected area 2 times daily   hydrOXYzine (ATARAX/VISTARIL) 25 MG tablet Take 1 tablet (25 mg total) by mouth at bedtime as needed.   hydrOXYzine (VISTARIL) 25 MG capsule Take 25 mg by mouth 3 (three) times daily as needed. Dose unknown   insulin aspart protamine -  aspart (NOVOLOG MIX 70/30 FLEXPEN) (70-30) 100 UNIT/ML FlexPen 35uam / 5u/25upm   insulin glargine (LANTUS) 100 UNIT/ML Solostar Pen Inject 13 Units into the skin daily.   Insulin Pen Needle (BD PEN NEEDLE NANO 2ND GEN) 32G X 4 MM MISC See admin instructions.   iron sucrose in sodium chloride 0.9 % 100 mL Inject into the vein.   levETIRAcetam (KEPPRA) 500 MG tablet Take 1 tablet (500 mg total) by mouth at bedtime.   melatonin 3 MG TABS tablet Take 1 tablet (3 mg total) by mouth at bedtime.   Methoxy PEG-Epoetin Beta (MIRCERA IJ) Mircera   methylphenidate (RITALIN) 5 MG tablet Take 1 tablet (5 mg total) by mouth 2 (two) times daily with breakfast and lunch.   mometasone-formoterol (DULERA) 200-5 MCG/ACT AERO Inhale 2 puffs into the lungs 2 (two) times daily.   Nutritional Supplements (,FEEDING SUPPLEMENT, PROSOURCE PLUS) liquid Take 30 mLs by mouth 2 (two) times daily between meals.   ondansetron (ZOFRAN) 4 MG tablet Take 1 tablet (4 mg total) by mouth every 6 (six) hours as needed for nausea or vomiting.   OneTouch Delica Lancets 46K MISC Use as directed to check blood sugars before breakfast and dinner dx: e11.65   pantoprazole (PROTONIX) 40 MG tablet Take 1 tablet (40 mg total) by mouth daily.   phenylephrine (,USE FOR PREPARATION-H,) 0.25 % suppository Place 1 suppository rectally 2 (two) times daily.   polyethylene glycol powder (GLYCOLAX/MIRALAX) 17 GM/SCOOP powder Take 17 g by mouth daily.   rosuvastatin (CRESTOR) 20 MG tablet Take 1 tablet (20  mg total) by mouth daily.   senna-docusate (SENOKOT-S) 8.6-50 MG tablet Take 2 tablets by mouth 2 (two) times daily.   [DISCONTINUED] iron polysaccharides (NIFEREX) 150 MG capsule Take 1 capsule (150 mg total) by mouth daily. (Patient not taking: No sig reported)   No facility-administered encounter medications on file as of 12/07/2021.  Reviewed chart prior to disease state call.   Recent Office Vitals: BP Readings from Last 3 Encounters:   11/18/21 (!) 183/82  11/04/21 (!) 223/94  11/04/21 (!) 155/60   Pulse Readings from Last 3 Encounters:  11/18/21 76  11/04/21 80  09/27/21 74    Wt Readings from Last 3 Encounters:  11/03/21 197 lb (89.4 kg)  09/27/21 197 lb (89.4 kg)  09/24/21 192 lb 9.6 oz (87.4 kg)     Kidney Function Lab Results  Component Value Date/Time   CREATININE 5.29 (H) 05/26/2021 12:41 PM   CREATININE 6.14 (H) 05/24/2021 12:53 PM   CREATININE 0.84 10/13/2011 09:03 AM   GFRNONAA 8 (L) 05/26/2021 12:41 PM   GFRNONAA 75 10/13/2011 09:03 AM   GFRAA 17 06/19/2020 12:00 AM   GFRAA 86 10/13/2011 09:03 AM    BMP Latest Ref Rng & Units 05/26/2021 05/24/2021 05/21/2021  Glucose 70 - 99 mg/dL 186(H) 170(H) 147(H)  BUN 8 - 23 mg/dL 22 30(H) 12  Creatinine 0.44 - 1.00 mg/dL 5.29(H) 6.14(H) 2.55(H)  BUN/Creat Ratio 12 - 28 - - -  Sodium 135 - 145 mmol/L 129(L) 130(L) 134(L)  Potassium 3.5 - 5.1 mmol/L 4.8 4.6 3.4(L)  Chloride 98 - 111 mmol/L 96(L) 96(L) 98  CO2 22 - 32 mmol/L 24 24 30   Calcium 8.9 - 10.3 mg/dL 9.3 9.3 7.8(L)   12-07-2021: 1st attempt left VM 12-09-2021: 2nd attempt VM full 12-10-2021: 3rd attempt VM Full, Left VM  Care Gaps: PNA vac overdue Shingrix overdue Dexa scan overdue Tdap overdue last completed 07-23-2008 3rd covid vaccine overdue last completed 01-07-2020 Yearly Ophthalmology overdue last completed 11-18-2019 Colonoscopy overdue last completed 04-10-2018 Flu vaccine overdue last completed 08-12-2020 AWV 09-02-2021  Star Rating Drugs: Rosuvastatin 20 mg- Last filled 08-18-2021 90 DS [name] Clinical Pharmacist Assistant [phone]

## 2021-12-07 NOTE — Patient Instructions (Signed)
Strengthening: Hip Flexion - Resisted    With tubing around left ankle, anchor behind, bring leg forward, keeping knee straight. Repeat __10__ times per set. Do __1__ sets per session. Do _1___ sessions per day.  http://[email or web address].us/639   Strengthening: Hip Abduction - Resisted    With tubing around right leg, other side toward anchor, extend leg out from side. Repeat __10__ times per set. Do _1___ sets per session. Do ___1_ sessions per day.  http://[email or web address].us/635   Strengthening: Hip Extension - Resisted    With tubing around right ankle, face anchor and pull leg straight back. Repeat __10__ times per set. Do __1__ sets per session. Do _1___ sessions per day.  http://orth.[email or web address].us/637

## 2021-12-08 NOTE — Therapy (Signed)
[facility] [address] [place], [name], [address] Phone: [phone]   Fax:  [phone]  Physical Therapy Treatment  Patient Details  Name: Carolyn Russo MRN: [id number] Date of Birth: [birth year] Referring Provider (PT): Courtney Heys, MD   Encounter Date: 12/07/2021   PT End of Session - 12/08/21 1103     Visit Number 10    Number of Visits 25    Date for PT Re-Evaluation 01/14/22    Authorization Type [employer] [place] submitted    Authorization Time Period 10/24/21- 01/14/22    Authorization - Visit Number 9    Authorization - Number of Visits 24    Progress Note Due on Visit 10    PT Start Time 1148    PT Stop Time 1229    PT Time Calculation (min) 41 min    Equipment Utilized During Treatment Gait belt    Activity Tolerance Patient tolerated treatment well    Behavior During Therapy WFL for tasks assessed/performed             Past Medical History:  Diagnosis Date   Acute GI bleeding    Allergy    Anemia    Anterior chest wall pain    Appendicitis 1965   Asthma    Body mass index 37.0-37.9, adult    Breast pain    Cataract    both eyes   CHF (congestive heart failure) ([place])    Chronic kidney disease    stage 5 - on dialysis   Cognitive change 04/20/2021   r/t cva 03/2821   Dehydration 2014   Deviated septum 1971   Diabetes mellitus    no meds   Dyspnea 2014   Extrinsic asthma    WITH ASTHMA ATTACK   Fibroid 1980   GERD (gastroesophageal reflux disease)    Heart murmur    Hx gestational diabetes    Hyperlipidemia    Hypertension 2014   Inguinal hernia 1959   Malaise and fatigue 2014   Non-IgE mediated allergic asthma 2014   Obesity    Pelvic pain    Pregnancy, high-risk 1985   Stroke ([place]) 04/20/2021   (CVA) of right basal ganglia   Tonsillitis 1968   Uterine fibroid 1980    Past Surgical History:  Procedure Laterality Date   APPENDECTOMY  4818   BASCILIC VEIN  TRANSPOSITION Right 04/30/2021   Procedure: RIGHT FIRST STAGE [place];  Surgeon: Angelia Mould, MD;  Location: [facility] OR;  Service: Vascular;  Laterality: Right;   CESAREAN SECTION  1985   COLONOSCOPY     ESOPHAGOGASTRODUODENOSCOPY (EGD) WITH PROPOFOL N/A 04/18/2021   Procedure: ESOPHAGOGASTRODUODENOSCOPY (EGD) WITH PROPOFOL;  Surgeon: Gatha Mayer, MD;  Location: [facility] ENDOSCOPY;  Service: Endoscopy;  Laterality: N/A;   EYE SURGERY     bilateral cataract    FLEXIBLE SIGMOIDOSCOPY N/A 04/18/2021   Procedure: FLEXIBLE SIGMOIDOSCOPY;  Surgeon: Gatha Mayer, MD;  Location: [facility] ENDOSCOPY;  Service: Endoscopy;  Laterality: N/A;   HERNIA REPAIR  1959   IR FLUORO GUIDE CV LINE RIGHT  03/18/2021   IR US GUIDE VASC ACCESS RIGHT  03/18/2021   LEFT HEART CATH AND CORONARY ANGIOGRAPHY N/A 04/04/2017   Procedure: Left Heart Cath and Coronary Angiography;  Surgeon: Belva Crome, MD;  Location: [facility] CV LAB;  Service: Cardiovascular;  Laterality: N/A;   MYOMECTOMY  1980, 2004, 2007   [place]  2003  SURGICAL REPAIR OF HEMORRHAGE  2015   TONSILLECTOMY  1968    There were no vitals filed for this visit.   Subjective Assessment - 12/08/21 1046     Subjective Pt accompanied to PT by her husband; reports no problems and has no c/o's    Patient is accompained by: --   CNA, Ms. Sherrod   Pertinent History ESRD with HD M-W-Fri:, obesity, DM type 2, CAD, iron deficiency anemia, bronchial asthma, chronic diastolic CHF, Rt basal ganglia CVA on 04-16-21, steal syndrome RUE due to dialysis vascular access (s/p fistula placement), CKD stage V:  Rt hand weakness; Lt hemiparesis due to CVA    Patient Stated Goals "be independent with mobility and self care"; "be able to drive again"    Currently in Pain? Yes    Pain Score 5     Pain Location Hand    Pain Orientation Right    Pain Descriptors / Indicators Sore    Pain Type Chronic pain    Pain Onset More  than a month ago    Pain Frequency Intermittent                               [facility] Adult PT Treatment/Exercise - 12/08/21 0001       Transfers   Transfers Sit to Stand    Sit to Stand 5: Supervision    Sit to Stand Details Verbal cues for technique    Sit to Stand Details (indicate cue type and reason) pt placed Rt hand on RW and used Lt hand to push up from mat table    Stand to Sit 5: Supervision    Number of Reps Other reps (comment)   5 reps     Ambulation/Gait   Ambulation/Gait Yes    Ambulation/Gait Assistance 4: Min guard    Ambulation/Gait Assistance Details cues to stay closer to RW for more upright posture    Ambulation Distance (Feet) 170 Feet    Assistive device Rolling walker    Gait Pattern Step-through pattern;Decreased hip/knee flexion - left;Decreased step length - left    Ambulation Surface Level;Indoor    Stairs Yes    Stairs Assistance 4: Min guard    Stair Management Technique One rail Right;Step to pattern;Sideways   sideways with descension   Number of Stairs 4    Height of Stairs 6      Knee/Hip Exercises: Standing   Hip Flexion Stengthening;1 set;Right;Left;10 reps   with red theraband   Hip Abduction Stengthening;Right;Left;1 set;10 reps   with red theraband   Hip Extension Stengthening;Right;Left;1 set;10 reps   with red theraband                Balance Exercises - 12/08/21 0001       Balance Exercises: Standing   Standing Eyes Opened Wide (BOA);Solid surface;1 rep;10 secs    Stepping Strategy Anterior;Lateral;UE support;5 reps   5 reps each leg alternating - with UE support prn on RW   Other Standing Exercises Tap ups to 1st step 5 reps each foot alternating with UE support on rails    Other Standing Exercises Comments Pt stood at counter - performed touching 3 colored discs on floor with LUE support on counter with CGA - 2 reps each leg for improved SLS                  PT Short Term Goals - 12/08/21 1110  PT SHORT TERM GOAL #1   Title Improve TUG score from 32 secs to </= 27 secs with RW with SBA to demo improved functional mobility. (all STGs due 12-17-21)    Baseline 11/18/21: 30.69 sec's with RW, improved from 32 sec's just not to goals    Status Partially Met    Target Date 12/17/21      PT SHORT TERM GOAL #2   Title Pt will perform 5 times sit to stand transfers from standard chair with only 1 UE support with SBA to demo increased LE strength and balance. Update:  Perform sit to stand without UE support from mat for 3 reps to demo improved LE strength.    Baseline 11/18/21: 17.94 sec's with single UE support from standard height surface with retropulstion noted with each rep, min guard assist for safety    Status Revised    Target Date 12/17/21      PT SHORT TERM GOAL #3   Title Increase Berg balance test score from 13/30 to >/= 19/30 to demo improved balance.  Updated STG; increase score to >/= 38/56    Baseline 11/18/21: 33/56 scored today    Status Revised    Target Date 12/17/21      PT SHORT TERM GOAL #4   Title Increase gait velocity by at least .4 ft/sec with RW to demo improved gait efficiency. Revised updated LTG:  increase gait velocity to >/= 2.0 ft/sec with RW    Baseline 11/02/21: 1.17 ft/sec with RW as baselne:    18.4, 18.82 = 1.74 ft/sec    Time 4    Period Weeks    Status Revised    Target Date 12/17/21      PT SHORT TERM GOAL #5   Title Amb. 60' with RW with SBA on flat, even surface for increased community accessibility.    Baseline 28' with RW due to fatigue -   11-23-21 = 190' with RW    Time 4    Period Weeks    Status On-going    Target Date 12/17/21      PT SHORT TERM GOAL #6   Title Independent with HEP for strengthening & balance exs.    Baseline 11/18/21: met with current program per pt and CNA    Status Achieved    Target Date 11/19/21               PT Long Term Goals - 12/08/21 1110       PT LONG TERM GOAL #1   Title Improve TUG  score to </= 20 secs with RW with supervision to reduce fall risk.    Baseline 32 secs with RW    Time 12    Period Weeks    Status New    Target Date 01/14/22      PT LONG TERM GOAL #2   Title Pt will amb. 500' with RW with SBA on flat, even surface for increased community accessibility.    Baseline 3' with RW    Time 12    Period Weeks    Status New    Target Date 01/14/22      PT LONG TERM GOAL #3   Title Increase Berg balance test score from 13/56 to >/= 28/56 to reduce fall risk.    Time 12    Period Weeks    Status New    Target Date 01/14/22      PT LONG TERM GOAL #4   Title Increase  gait velocity by at least 1.0 ft/sec with RW for incr. gait efficiency.    Time 12    Period Weeks    Status New    Target Date 01/14/22      PT LONG TERM GOAL #5   Title Perform sit to stand from chair without UE support to demo improved LE strength and balance.    Time 12    Period Weeks    Status New    Target Date 01/14/22      PT LONG TERM GOAL #6   Title Negotiate 4 steps with 1 hand rail using step by step sequence with SBA.    Time 12    Period Weeks    Status New    Target Date 01/14/22      PT LONG TERM GOAL #7   Title Independent in updated HEP for balance & strengthening.    Time 12    Period Weeks    Status New    Target Date 01/14/22                   Plan - 12/08/21 1103     Clinical Impression Statement This 10th visit progress note covers dates 10-21-21 - 12-07-21.  Pt has met STG's #2-4 and partially met STG #1 as TUG score has only improved by 2 secs, from 32 secs at eval to 30.69 secs with RW when tested on 11-18-21.  Pt able to amb. 170' with RW nonstop, but not 230' per stated STG #5.  Pt continues to have decreased endurance/activity tolerance.  Pt's Berg score has increased from 13/56 to 33/56 on 11-18-21.  Gait velocity has increased from 1.17 ft/sec with RW to 1.74 ft/sec with RW on 11-18-21.  Initial STG's have been upgraded.  Cont with POC.     Personal Factors and Comorbidities Fitness;Comorbidity 2;Behavior Pattern    Comorbidities CAD, ESRD on HD M-W-Fri, CKD stage V, iron deficiency anemia, DM type 2, bronchial asthma, chronic diastolic CHF, s/p Rt basal ganglia CVA, steal syndrome RUE with fistula implant    Examination-Activity Limitations Bathing;Bed Mobility;Locomotion Level;Transfers;Reach Overhead;Squat;Stairs;Stand;Toileting;Hygiene/Grooming;Bend;Carry    Examination-Participation Restrictions Cleaning;Community Activity;Driving;Interpersonal Relationship;Laundry;Meal Prep;Shop    Stability/Clinical Decision Making Evolving/Moderate complexity    Rehab Potential Good    PT Frequency 2x / week    PT Duration 12 weeks    PT Treatment/Interventions ADLs/Self Care Home Management;DME Instruction;Gait training;Stair training;Therapeutic activities;Therapeutic exercise;Balance training;Neuromuscular re-education;Patient/family education    PT Next Visit Plan Check to see if pt had any ?'s or problems with standing theraband exs for hip strenghening issued on 12-07-21 for HEP:  continue with use of scifit/nustep for strengthening/activity tolerance; continue to address strengthening and balance;    PT Home Exercise Plan Access Code: [email or web address]    Consulted and Agree with Plan of Care Patient   CNA - Ms. Sherrod            Patient will benefit from skilled therapeutic intervention in order to improve the following deficits and impairments:  Abnormal gait, Decreased activity tolerance, Decreased balance, Decreased strength, Decreased endurance, Decreased range of motion, Impaired sensation, Impaired UE functional use  Visit Diagnosis: Hemiplegia and hemiparesis following cerebral infarction affecting left non-dominant side (HCC)  Muscle weakness (generalized)  Other abnormalities of gait and mobility  Unsteadiness on feet     Problem List Patient Active Problem List   Diagnosis Date Noted   Left hemiparesis ([place])  09/24/2021   Abnormality of gait 09/24/2021   Pain of  right hand 08/06/2021   Steal syndrome of dialysis vascular access ([place]) 08/06/2021   Subclavian steal syndrome of right subclavian artery 06/25/2021   Right hand weakness 06/25/2021   Stroke-like symptoms 05/06/2021   Bandemia    Cerebrovascular accident (CVA) of right basal ganglia ([place]) 04/20/2021   External hemorrhoids    Basal ganglia infarction ([place]) 04/16/2021   ESRD on dialysis ([place]) 04/16/2021   Hypertensive urgency 03/10/2021   Hilar enlargement    Anemia of chronic disease    Chronic diastolic CHF (congestive heart failure) ([place]) 03/09/2021   CKD (chronic kidney disease), stage V ([place]) 03/08/2021   Diabetic retinopathy ([place]) 03/08/2021   Hyperglycemia due to type 2 diabetes mellitus ([place]) 03/08/2021   Pure hypercholesterolemia 03/08/2021   Anemia in chronic kidney disease 09/24/2020   Uncontrolled type 2 diabetes mellitus with hyperglycemia ([place]) 03/19/2019   Osteopenia 09/04/2018   Migraine 09/04/2018   Asthma 09/04/2018   Pseudophakia of both eyes 07/17/2018   Pseudophakia of left eye 07/03/2018   Open angle with borderline findings and high glaucoma risk in left eye 07/03/2018   Age-related nuclear cataract of right eye 07/03/2018   CAD (coronary artery disease), native coronary artery 04/27/2017   Abnormal nuclear stress test 04/04/2017   Dyspnea on exertion 03/09/2017   Appendicitis    Age-related hypermature cataract of both eyes 12/20/2016   Uterine leiomyoma 11/27/2012   Dyslipidemia (high LDL; low HDL) 11/25/2011   Obesity (BMI 30-39.9) 11/25/2011   Hypertensive disorder 11/21/2011   Type 2 diabetes mellitus ([place]) 11/21/2011   Abnormal EKG 11/21/2011    Ferdinando Lodge, [place], PT 12/08/2021, 11:13 AM  [facility] [address] [place], [name], [address] Phone: [phone]   Fax:  [phone]  Name: Carolyn Russo MRN: [id number] Date  of Birth: 1951/09/28

## 2021-12-09 ENCOUNTER — Encounter: Payer: Self-pay | Admitting: Physical Therapy

## 2021-12-09 ENCOUNTER — Ambulatory Visit: Payer: Medicare PPO | Admitting: Occupational Therapy

## 2021-12-09 ENCOUNTER — Other Ambulatory Visit: Payer: Self-pay

## 2021-12-09 ENCOUNTER — Ambulatory Visit: Payer: Medicare PPO | Admitting: Physical Therapy

## 2021-12-09 DIAGNOSIS — M6281 Muscle weakness (generalized): Secondary | ICD-10-CM

## 2021-12-09 DIAGNOSIS — I69354 Hemiplegia and hemiparesis following cerebral infarction affecting left non-dominant side: Secondary | ICD-10-CM

## 2021-12-09 DIAGNOSIS — R2689 Other abnormalities of gait and mobility: Secondary | ICD-10-CM

## 2021-12-09 DIAGNOSIS — R2681 Unsteadiness on feet: Secondary | ICD-10-CM

## 2021-12-09 NOTE — Therapy (Signed)
[facility] [address] [place], [name], [address] Phone: [phone]   Fax:  [phone]  Occupational Therapy Treatment/Arrive No Charge  Patient Details  Name: Carolyn Russo MRN: [id number] Date of Birth: 11/28/1950 Referring Provider (OT): Dr. Dagoberto Ligas   Encounter Date: 12/09/2021   OT End of Session - 12/09/21 1152     Visit Number 0    Number of Visits 25    Date for OT Re-Evaluation 01/13/22    Authorization Type Medicare, State BCBS    Authorization - Visit Number 10    Authorization - Number of Visits 10    Progress Note Due on Visit 10             Past Medical History:  Diagnosis Date   Acute GI bleeding    Allergy    Anemia    Anterior chest wall pain    Appendicitis 1965   Asthma    Body mass index 37.0-37.9, adult    Breast pain    Cataract    both eyes   CHF (congestive heart failure) ([place])    Chronic kidney disease    stage 5 - on dialysis   Cognitive change 04/20/2021   r/t cva 03/2821   Dehydration 2014   Deviated septum 1971   Diabetes mellitus    no meds   Dyspnea 2014   Extrinsic asthma    WITH ASTHMA ATTACK   Fibroid 1980   GERD (gastroesophageal reflux disease)    Heart murmur    Hx gestational diabetes    Hyperlipidemia    Hypertension 2014   Inguinal hernia 1959   Malaise and fatigue 2014   Non-IgE mediated allergic asthma 2014   Obesity    Pelvic pain    Pregnancy, high-risk 1985   Stroke ([place]) 04/20/2021   (CVA) of right basal ganglia   Tonsillitis 1968   Uterine fibroid 1980    Past Surgical History:  Procedure Laterality Date   APPENDECTOMY  3664   BASCILIC VEIN TRANSPOSITION Right 04/30/2021   Procedure: RIGHT FIRST STAGE [place];  Surgeon: Angelia Mould, MD;  Location: [facility] OR;  Service: Vascular;  Laterality: Right;   CESAREAN SECTION  1985   COLONOSCOPY     ESOPHAGOGASTRODUODENOSCOPY (EGD) WITH PROPOFOL N/A 04/18/2021    Procedure: ESOPHAGOGASTRODUODENOSCOPY (EGD) WITH PROPOFOL;  Surgeon: Gatha Mayer, MD;  Location: [facility] ENDOSCOPY;  Service: Endoscopy;  Laterality: N/A;   EYE SURGERY     bilateral cataract    FLEXIBLE SIGMOIDOSCOPY N/A 04/18/2021   Procedure: FLEXIBLE SIGMOIDOSCOPY;  Surgeon: Gatha Mayer, MD;  Location: [facility] ENDOSCOPY;  Service: Endoscopy;  Laterality: N/A;   HERNIA REPAIR  1959   IR FLUORO GUIDE CV LINE RIGHT  03/18/2021   IR US GUIDE VASC ACCESS RIGHT  03/18/2021   LEFT HEART CATH AND CORONARY ANGIOGRAPHY N/A 04/04/2017   Procedure: Left Heart Cath and Coronary Angiography;  Surgeon: Belva Crome, MD;  Location: [facility] CV LAB;  Service: Cardiovascular;  Laterality: N/A;   MYOMECTOMY  1980, 2004, 2007   RHINOPLASTY  1971   ROTATOR CUFF REPAIR  2003   SURGICAL REPAIR OF HEMORRHAGE  2015   TONSILLECTOMY  1968    There were no vitals filed for this visit.      Pt arrived for PT and completed session but was unable to stay d/t scheduling conflict for occupational therapy. Arrived - no charge.  OT Short Term Goals - 12/07/21 1220       OT SHORT TERM GOAL #1   Title I with initial HEP    Time 4    Period Weeks    Status Achieved    Target Date 11/18/21      OT SHORT TERM GOAL #2   Title Pt will donn shirt  and bathe UB with min A consistently    Time 4    Period Weeks    Status On-going   not consistent     OT SHORT TERM GOAL #3   Title Pt will be I with bilateral UE positioning including splinting  to minimize pain and risk for contracture and injury.    Time 4    Period Weeks    Status Partially Met   splints have been issued and pt/ caregiver education performed- pt needs reinforcement     OT SHORT TERM GOAL #4   Title Pt will demonstrate 60* LUE shoulder flexion in prep for functional reach    Time 4    Period Weeks    Status On-going   55*     OT SHORT TERM GOAL #5   Title Pt will demonstrate improved bilateral UE  functional use as eveidenced by increasing bilateral box/ blocks score by 3 blocks.    Time 4    Period Weeks    Status On-going               OT Long Term Goals - 10/21/21 1350       OT LONG TERM GOAL #1   Title Pt will perform UB dressing with setup and LB dressing with min A.    Time 12    Period Weeks    Status New      OT LONG TERM GOAL #2   Title Pt will bathe herself with min A.    Time 12    Period Weeks    Status New      OT LONG TERM GOAL #3   Title Pt will perform simple home management/ cooking tasks with min A.    Time 12    Period Weeks    Status New      OT LONG TERM GOAL #4   Title Pt will retrieve a lightweight object at 75* shoulder flexion with LUE.    Time 12    Period Weeks    Status New      OT LONG TERM GOAL #5   Title Pt will increase RUE grip strength to 15 lbs for increased functional use.    Time 12    Period Weeks    Status New      OT LONG TERM GOAL #6   Title Pt will demonstrate ability to write her name and address with 100% legibility using AE prn.    Time 12    Period Weeks    Status New      OT LONG TERM GOAL #7   Title Pt will utilize RUE at least 75% of the time for ADLS/ IADLS with pain no greater than 4/10.    Time 12    Period Weeks    Status New                    Patient will benefit from skilled therapeutic intervention in order to improve the following deficits and impairments:           Visit Diagnosis: No diagnosis found.  Problem List Patient Active Problem List   Diagnosis Date Noted   Left hemiparesis ([place]) 09/24/2021   Abnormality of gait 09/24/2021   Pain of right hand 08/06/2021   Steal syndrome of dialysis vascular access ([place]) 08/06/2021   Subclavian steal syndrome of right subclavian artery 06/25/2021   Right hand weakness 06/25/2021   Stroke-like symptoms 05/06/2021   Bandemia    Cerebrovascular accident (CVA) of right basal ganglia ([place]) 04/20/2021   External  hemorrhoids    Basal ganglia infarction ([place]) 04/16/2021   ESRD on dialysis ([place]) 04/16/2021   Hypertensive urgency 03/10/2021   Hilar enlargement    Anemia of chronic disease    Chronic diastolic CHF (congestive heart failure) ([place]) 03/09/2021   CKD (chronic kidney disease), stage V ([place]) 03/08/2021   Diabetic retinopathy ([place]) 03/08/2021   Hyperglycemia due to type 2 diabetes mellitus ([place]) 03/08/2021   Pure hypercholesterolemia 03/08/2021   Anemia in chronic kidney disease 09/24/2020   Uncontrolled type 2 diabetes mellitus with hyperglycemia ([place]) 03/19/2019   Osteopenia 09/04/2018   Migraine 09/04/2018   Asthma 09/04/2018   Pseudophakia of both eyes 07/17/2018   Pseudophakia of left eye 07/03/2018   Open angle with borderline findings and high glaucoma risk in left eye 07/03/2018   Age-related nuclear cataract of right eye 07/03/2018   CAD (coronary artery disease), native coronary artery 04/27/2017   Abnormal nuclear stress test 04/04/2017   Dyspnea on exertion 03/09/2017   Appendicitis    Age-related hypermature cataract of both eyes 12/20/2016   Uterine leiomyoma 11/27/2012   Dyslipidemia (high LDL; low HDL) 11/25/2011   Obesity (BMI 30-39.9) 11/25/2011   Hypertensive disorder 11/21/2011   Type 2 diabetes mellitus ([place]) 11/21/2011   Abnormal EKG 11/21/2011    Zachery Conch, OT 12/09/2021, 11:52 AM  [facility] [address] [place], [name], [address] Phone: [phone]   Fax:  [phone]  Name: SHANDREA LUSK MRN: [id number] Date of Birth: 04/14/1951

## 2021-12-09 NOTE — Therapy (Signed)
[facility] [address] [place], [name], [address] Phone: [phone]   Fax:  [phone]  Physical Therapy Treatment  Patient Details  Name: Carolyn Russo MRN: [id number] Date of Birth: 1951/03/01 Referring Provider (PT): Courtney Heys, MD   Encounter Date: 12/09/2021   PT End of Session - 12/09/21 1111     Visit Number 11    Number of Visits 25    Date for PT Re-Evaluation 01/14/22    Authorization Type [employer] [place] submitted    Authorization Time Period 10/24/21- 01/14/22    Authorization - Visit Number 10    Authorization - Number of Visits 24    Progress Note Due on Visit 10    PT Start Time 1105    PT Stop Time 1144    PT Time Calculation (min) 39 min    Equipment Utilized During Treatment Gait belt    Activity Tolerance Patient tolerated treatment well    Behavior During Therapy WFL for tasks assessed/performed             Past Medical History:  Diagnosis Date   Acute GI bleeding    Allergy    Anemia    Anterior chest wall pain    Appendicitis 1965   Asthma    Body mass index 37.0-37.9, adult    Breast pain    Cataract    both eyes   CHF (congestive heart failure) ([place])    Chronic kidney disease    stage 5 - on dialysis   Cognitive change 04/20/2021   r/t cva 03/2821   Dehydration 2014   Deviated septum 1971   Diabetes mellitus    no meds   Dyspnea 2014   Extrinsic asthma    WITH ASTHMA ATTACK   Fibroid 1980   GERD (gastroesophageal reflux disease)    Heart murmur    Hx gestational diabetes    Hyperlipidemia    Hypertension 2014   Inguinal hernia 1959   Malaise and fatigue 2014   Non-IgE mediated allergic asthma 2014   Obesity    Pelvic pain    Pregnancy, high-risk 1985   Stroke ([place]) 04/20/2021   (CVA) of right basal ganglia   Tonsillitis 1968   Uterine fibroid 1980    Past Surgical History:  Procedure Laterality Date   APPENDECTOMY  5697   BASCILIC VEIN  TRANSPOSITION Right 04/30/2021   Procedure: RIGHT FIRST STAGE [place];  Surgeon: Angelia Mould, MD;  Location: [facility] OR;  Service: Vascular;  Laterality: Right;   CESAREAN SECTION  1985   COLONOSCOPY     ESOPHAGOGASTRODUODENOSCOPY (EGD) WITH PROPOFOL N/A 04/18/2021   Procedure: ESOPHAGOGASTRODUODENOSCOPY (EGD) WITH PROPOFOL;  Surgeon: Gatha Mayer, MD;  Location: [facility] ENDOSCOPY;  Service: Endoscopy;  Laterality: N/A;   EYE SURGERY     bilateral cataract    FLEXIBLE SIGMOIDOSCOPY N/A 04/18/2021   Procedure: FLEXIBLE SIGMOIDOSCOPY;  Surgeon: Gatha Mayer, MD;  Location: [facility] ENDOSCOPY;  Service: Endoscopy;  Laterality: N/A;   HERNIA REPAIR  1959   IR FLUORO GUIDE CV LINE RIGHT  03/18/2021   IR US GUIDE VASC ACCESS RIGHT  03/18/2021   LEFT HEART CATH AND CORONARY ANGIOGRAPHY N/A 04/04/2017   Procedure: Left Heart Cath and Coronary Angiography;  Surgeon: Belva Crome, MD;  Location: [facility] CV LAB;  Service: Cardiovascular;  Laterality: N/A;   MYOMECTOMY  1980, 2004, 2007   [place]  2003  SURGICAL REPAIR OF HEMORRHAGE  2015   TONSILLECTOMY  1968    There were no vitals filed for this visit.   Subjective Assessment - 12/09/21 1109     Subjective No new complaitns. No falls or pain to report. No issues with new standing ex's at home.    Patient is accompained by: Family member   spouse, Dr. [name]   Pertinent History ESRD with HD M-W-Fri:, obesity, DM type 2, CAD, iron deficiency anemia, bronchial asthma, chronic diastolic CHF, Rt basal ganglia CVA on 04-16-21, steal syndrome RUE due to dialysis vascular access (s/p fistula placement), CKD stage V:  Rt hand weakness; Lt hemiparesis due to CVA    Patient Stated Goals "be independent with mobility and self care"; "be able to drive again"    Currently in Pain? Yes    Pain Score 5     Pain Location Generalized   right hand, left shoulder   Pain Orientation Right;Left    Pain  Descriptors / Indicators Aching;Sore    Pain Type Chronic pain    Pain Onset More than a month ago    Pain Frequency Intermittent    Aggravating Factors  pressure through hand, certain movements of hand and shoulder    Pain Relieving Factors rest, inactivity                    [facility] Adult PT Treatment/Exercise - 12/09/21 1112       Transfers   Transfers Sit to Stand;Stand to Sit    Sit to Stand 5: Supervision;With upper extremity assist;From bed;From chair/3-in-1    Stand to Sit 5: Supervision;With upper extremity assist;To bed;To chair/3-in-1      Ambulation/Gait   Ambulation/Gait Yes    Ambulation/Gait Assistance 4: Min guard    Ambulation/Gait Assistance Details cues on posture and walker position. pt needs encouragement to continue with gait, no balance issues or shortness of breath after longer walk this session.    Ambulation Distance (Feet) 230 Feet   x1, plus around clinic with session   Assistive device Rolling walker    Gait Pattern Step-through pattern;Decreased hip/knee flexion - left;Decreased step length - left    Ambulation Surface Level;Indoor      Knee/Hip Exercises: Aerobic   Other Aerobic Scifit UE/LE's level 4 x 8 minutes with goal >/= 80 steps per minute for strengthening and activity tolerance.      Knee/Hip Exercises: Standing   Heel Raises Both;1 set;10 reps;Limitations    Heel Raises Limitations 2# ankle weights on bil LE's with UE support on sturdy surface. cues on posture with ex.    Hip Flexion AROM;Stengthening;Both;1 set;10 reps;Knee bent;Limitations    Hip Flexion Limitations with 2# ankle weights alternating marching with cues to slow down, for posture and UE support on sturdy surface.    Hip Abduction AROM;Stengthening;Both;1 set;10 reps;Knee straight;Limitations    Abduction Limitations with 2# ankle weights, alternating sides with UE support on sturdy surface with cues on posture, and to slow down.    Hip Extension  AROM;Stengthening;Both;1 set;10 reps;Knee straight;Limitations    Extension Limitations 2# ankle weights, alternating LE's with UE support on sturdy surface. cues for posture/form.      Knee/Hip Exercises: Seated   Long Arc Quad AROM;Strengthening;Both;1 set;10 reps;Weights;Limitations    Long Arc Quad Weight 2 lbs.    Long Arc Quad Limitations alteranting LE's with cues for slow movements  PT Short Term Goals - 12/08/21 1110       PT SHORT TERM GOAL #1   Title Improve TUG score from 32 secs to </= 27 secs with RW with SBA to demo improved functional mobility. (all STGs due 12-17-21)    Baseline 11/18/21: 30.69 sec's with RW, improved from 32 sec's just not to goals    Status Partially Met    Target Date 12/17/21      PT SHORT TERM GOAL #2   Title Pt will perform 5 times sit to stand transfers from standard chair with only 1 UE support with SBA to demo increased LE strength and balance. Update:  Perform sit to stand without UE support from mat for 3 reps to demo improved LE strength.    Baseline 11/18/21: 17.94 sec's with single UE support from standard height surface with retropulstion noted with each rep, min guard assist for safety    Status Revised    Target Date 12/17/21      PT SHORT TERM GOAL #3   Title Increase Berg balance test score from 13/30 to >/= 19/30 to demo improved balance.  Updated STG; increase score to >/= 38/56    Baseline 11/18/21: 33/56 scored today    Status Revised    Target Date 12/17/21      PT SHORT TERM GOAL #4   Title Increase gait velocity by at least .4 ft/sec with RW to demo improved gait efficiency. Revised updated LTG:  increase gait velocity to >/= 2.0 ft/sec with RW    Baseline 11/02/21: 1.17 ft/sec with RW as baselne:    18.4, 18.82 = 1.74 ft/sec    Time 4    Period Weeks    Status Revised    Target Date 12/17/21      PT SHORT TERM GOAL #5   Title Amb. 46' with RW with SBA on flat, even surface for increased  community accessibility.    Baseline 63' with RW due to fatigue -   11-23-21 = 190' with RW    Time 4    Period Weeks    Status On-going    Target Date 12/17/21      PT SHORT TERM GOAL #6   Title Independent with HEP for strengthening & balance exs.    Baseline 11/18/21: met with current program per pt and CNA    Status Achieved    Target Date 11/19/21               PT Long Term Goals - 12/08/21 1110       PT LONG TERM GOAL #1   Title Improve TUG score to </= 20 secs with RW with supervision to reduce fall risk.    Baseline 32 secs with RW    Time 12    Period Weeks    Status New    Target Date 01/14/22      PT LONG TERM GOAL #2   Title Pt will amb. 500' with RW with SBA on flat, even surface for increased community accessibility.    Baseline 62' with RW    Time 12    Period Weeks    Status New    Target Date 01/14/22      PT LONG TERM GOAL #3   Title Increase Berg balance test score from 13/56 to >/= 28/56 to reduce fall risk.    Time 12    Period Weeks    Status New    Target Date 01/14/22  PT LONG TERM GOAL #4   Title Increase gait velocity by at least 1.0 ft/sec with RW for incr. gait efficiency.    Time 12    Period Weeks    Status New    Target Date 01/14/22      PT LONG TERM GOAL #5   Title Perform sit to stand from chair without UE support to demo improved LE strength and balance.    Time 12    Period Weeks    Status New    Target Date 01/14/22      PT LONG TERM GOAL #6   Title Negotiate 4 steps with 1 hand rail using step by step sequence with SBA.    Time 12    Period Weeks    Status New    Target Date 01/14/22      PT LONG TERM GOAL #7   Title Independent in updated HEP for balance & strengthening.    Time 12    Period Weeks    Status New    Target Date 01/14/22                   Plan - 12/09/21 1111     Clinical Impression Statement Skilled session continued to focus on activity tolerance, strengthening and gait  training with walker. Rest breaks taken as needed due to shortness of breath that resolved quickly. No other issues noted or reported in session. Pt able to increased her consecutive gait distance this session with encouragement to "keep going" needed, no balance issues. The pt should benefit from continued PT to progress toward unmet goals.    Personal Factors and Comorbidities Fitness;Comorbidity 2;Behavior Pattern    Comorbidities CAD, ESRD on HD M-W-Fri, CKD stage V, iron deficiency anemia, DM type 2, bronchial asthma, chronic diastolic CHF, s/p Rt basal ganglia CVA, steal syndrome RUE with fistula implant    Examination-Activity Limitations Bathing;Bed Mobility;Locomotion Level;Transfers;Reach Overhead;Squat;Stairs;Stand;Toileting;Hygiene/Grooming;Bend;Carry    Examination-Participation Restrictions Cleaning;Community Activity;Driving;Interpersonal Relationship;Laundry;Meal Prep;Shop    Stability/Clinical Decision Making Evolving/Moderate complexity    Rehab Potential Good    PT Frequency 2x / week    PT Duration 12 weeks    PT Treatment/Interventions ADLs/Self Care Home Management;DME Instruction;Gait training;Stair training;Therapeutic activities;Therapeutic exercise;Balance training;Neuromuscular re-education;Patient/family education    PT Next Visit Plan continue with use of scifit/nustep for strengthening/activity tolerance; continue to address strengthening and balance;    PT Home Exercise Plan Access Code: [email or web address]    Consulted and Agree with Plan of Care Patient   CNA - Ms. Sherrod            Patient will benefit from skilled therapeutic intervention in order to improve the following deficits and impairments:  Abnormal gait, Decreased activity tolerance, Decreased balance, Decreased strength, Decreased endurance, Decreased range of motion, Impaired sensation, Impaired UE functional use  Visit Diagnosis: Hemiplegia and hemiparesis following cerebral infarction affecting left  non-dominant side (HCC)  Muscle weakness (generalized)  Unsteadiness on feet  Other abnormalities of gait and mobility     Problem List Patient Active Problem List   Diagnosis Date Noted   Left hemiparesis ([place]) 09/24/2021   Abnormality of gait 09/24/2021   Pain of right hand 08/06/2021   Steal syndrome of dialysis vascular access ([place]) 08/06/2021   Subclavian steal syndrome of right subclavian artery 06/25/2021   Right hand weakness 06/25/2021   Stroke-like symptoms 05/06/2021   Bandemia    Cerebrovascular accident (CVA) of right basal ganglia ([place]) 04/20/2021   External hemorrhoids  Basal ganglia infarction ([place]) 04/16/2021   ESRD on dialysis ([place]) 04/16/2021   Hypertensive urgency 03/10/2021   Hilar enlargement    Anemia of chronic disease    Chronic diastolic CHF (congestive heart failure) ([place]) 03/09/2021   CKD (chronic kidney disease), stage V ([place]) 03/08/2021   Diabetic retinopathy ([place]) 03/08/2021   Hyperglycemia due to type 2 diabetes mellitus ([place]) 03/08/2021   Pure hypercholesterolemia 03/08/2021   Anemia in chronic kidney disease 09/24/2020   Uncontrolled type 2 diabetes mellitus with hyperglycemia ([place]) 03/19/2019   Osteopenia 09/04/2018   Migraine 09/04/2018   Asthma 09/04/2018   Pseudophakia of both eyes 07/17/2018   Pseudophakia of left eye 07/03/2018   Open angle with borderline findings and high glaucoma risk in left eye 07/03/2018   Age-related nuclear cataract of right eye 07/03/2018   CAD (coronary artery disease), native coronary artery 04/27/2017   Abnormal nuclear stress test 04/04/2017   Dyspnea on exertion 03/09/2017   Appendicitis    Age-related hypermature cataract of both eyes 12/20/2016   Uterine leiomyoma 11/27/2012   Dyslipidemia (high LDL; low HDL) 11/25/2011   Obesity (BMI 30-39.9) 11/25/2011   Hypertensive disorder 11/21/2011   Type 2 diabetes mellitus ([place]) 11/21/2011   Abnormal EKG 11/21/2011    Willow Ora, PTA,  [facility] [address] [place], [place] [address] [phone] 12/09/21, 12:49 PM   Name: Carolyn Russo MRN: [id number] Date of Birth: 11/15/1950

## 2021-12-14 ENCOUNTER — Other Ambulatory Visit: Payer: Self-pay

## 2021-12-14 ENCOUNTER — Ambulatory Visit: Payer: Medicare PPO | Admitting: Physical Therapy

## 2021-12-14 ENCOUNTER — Ambulatory Visit: Payer: Medicare PPO | Admitting: Occupational Therapy

## 2021-12-14 VITALS — BP 182/70

## 2021-12-14 DIAGNOSIS — I69354 Hemiplegia and hemiparesis following cerebral infarction affecting left non-dominant side: Secondary | ICD-10-CM | POA: Diagnosis not present

## 2021-12-14 DIAGNOSIS — M6281 Muscle weakness (generalized): Secondary | ICD-10-CM

## 2021-12-14 DIAGNOSIS — R2689 Other abnormalities of gait and mobility: Secondary | ICD-10-CM

## 2021-12-14 DIAGNOSIS — R2681 Unsteadiness on feet: Secondary | ICD-10-CM

## 2021-12-15 NOTE — Therapy (Signed)
[facility] [address] [place], [name], [address] Phone: [phone]   Fax:  [phone]  Physical Therapy Treatment  Patient Details  Name: Carolyn Russo MRN: [id number] Date of Birth: Dec 25, 1950 Referring Provider (PT): Courtney Heys, MD   Encounter Date: 12/14/2021   PT End of Session - 12/15/21 0958     Visit Number 12    Number of Visits 25    Date for PT Re-Evaluation 01/14/22    Authorization Type [employer] [place] submitted    Authorization Time Period 10/24/21- 01/14/22    Authorization - Visit Number 10    Authorization - Number of Visits 24    Progress Note Due on Visit 20    PT Start Time 1150    PT Stop Time 1240   session extended due to pt going to restroom during session   PT Time Calculation (min) 50 min    Equipment Utilized During Treatment Gait belt    Activity Tolerance Patient tolerated treatment well    Behavior During Therapy WFL for tasks assessed/performed             Past Medical History:  Diagnosis Date   Acute GI bleeding    Allergy    Anemia    Anterior chest wall pain    Appendicitis 1965   Asthma    Body mass index 37.0-37.9, adult    Breast pain    Cataract    both eyes   CHF (congestive heart failure) ([place])    Chronic kidney disease    stage 5 - on dialysis   Cognitive change 04/20/2021   r/t cva 03/2821   Dehydration 2014   Deviated septum 1971   Diabetes mellitus    no meds   Dyspnea 2014   Extrinsic asthma    WITH ASTHMA ATTACK   Fibroid 1980   GERD (gastroesophageal reflux disease)    Heart murmur    Hx gestational diabetes    Hyperlipidemia    Hypertension 2014   Inguinal hernia 1959   Malaise and fatigue 2014   Non-IgE mediated allergic asthma 2014   Obesity    Pelvic pain    Pregnancy, high-risk 1985   Stroke ([place]) 04/20/2021   (CVA) of right basal ganglia   Tonsillitis 1968   Uterine fibroid 1980    Past Surgical History:  Procedure  Laterality Date   APPENDECTOMY  0254   BASCILIC VEIN TRANSPOSITION Right 04/30/2021   Procedure: RIGHT FIRST STAGE [place];  Surgeon: Angelia Mould, MD;  Location: [facility] OR;  Service: Vascular;  Laterality: Right;   CESAREAN SECTION  1985   COLONOSCOPY     ESOPHAGOGASTRODUODENOSCOPY (EGD) WITH PROPOFOL N/A 04/18/2021   Procedure: ESOPHAGOGASTRODUODENOSCOPY (EGD) WITH PROPOFOL;  Surgeon: Gatha Mayer, MD;  Location: [facility] ENDOSCOPY;  Service: Endoscopy;  Laterality: N/A;   EYE SURGERY     bilateral cataract    FLEXIBLE SIGMOIDOSCOPY N/A 04/18/2021   Procedure: FLEXIBLE SIGMOIDOSCOPY;  Surgeon: Gatha Mayer, MD;  Location: [facility] ENDOSCOPY;  Service: Endoscopy;  Laterality: N/A;   HERNIA REPAIR  1959   IR FLUORO GUIDE CV LINE RIGHT  03/18/2021   IR US GUIDE VASC ACCESS RIGHT  03/18/2021   LEFT HEART CATH AND CORONARY ANGIOGRAPHY N/A 04/04/2017   Procedure: Left Heart Cath and Coronary Angiography;  Surgeon: Belva Crome, MD;  Location: [facility] CV LAB;  Service: Cardiovascular;  Laterality: N/A;   [place], 2004, 2007   RHINOPLASTY  [place]  2003   SURGICAL REPAIR OF HEMORRHAGE  2015   TONSILLECTOMY  1968    Vitals:   12/14/21 1151  BP: (!) 182/70     Subjective Assessment - 12/14/21 1151     Subjective Pt is accompanied by her husband and new CNA, Zigmund Daniel; pt missed OT appt prior to PT today due to lack of transportation; no changes /no falls reported    Patient is accompained by: Family member   spouse, Dr. [name] and CNA, Zigmund Daniel   Pertinent History ESRD with HD M-W-Fri:, obesity, DM type 2, CAD, iron deficiency anemia, bronchial asthma, chronic diastolic CHF, Rt basal ganglia CVA on 04-16-21, steal syndrome RUE due to dialysis vascular access (s/p fistula placement), CKD stage V:  Rt hand weakness; Lt hemiparesis due to CVA    Patient Stated Goals "be independent with mobility and self care"; "be able to drive again"    Currently in  Pain? Yes   Rt hip   Pain Score 5     Pain Location Wrist    Pain Orientation Right    Pain Descriptors / Indicators Aching;Sore    Pain Type Chronic pain    Pain Onset More than a month ago    Pain Frequency Intermittent                               [facility] Adult PT Treatment/Exercise - 12/15/21 0001       Transfers   Transfers Sit to Stand;Stand to Sit    Sit to Stand 5: Supervision   from mat and from standard chair during session   Sit to Stand Details (indicate cue type and reason) cues for correct hand placement - instructed to push up with LUE due to c/o pain with pushing up with RUE    Stand to Sit 5: Supervision;With upper extremity assist;To bed;To chair/3-in-1    Stand to Sit Details cues to reach back for controlled descent      Ambulation/Gait   Ambulation/Gait Yes    Ambulation/Gait Assistance 4: Min guard    Ambulation/Gait Assistance Details cues to stay close to RW for more upright posture    Ambulation Distance (Feet) 115 Feet    Assistive device Rolling walker    Gait Pattern Step-through pattern;Decreased hip/knee flexion - left;Decreased step length - left    Ambulation Surface Level;Indoor    Stairs Yes    Stairs Assistance 4: Min guard    Stair Management Technique One rail Right;Forwards;Sideways;Step to pattern   sideways with descension   Number of Stairs 4    Height of Stairs 6    Gait Comments attempted gait training along counter with LUE support with CGA but pt c/o sciatica LLE so gait training was discontinued; also tried using [facility] but pt was too unsteady and relied too heavily on cane (used cane in RUE which increased pain in Rt wrist with weight bearing)- pt will continue to use RW for assistance with amb. at this time      Knee/Hip Exercises: Aerobic   Recumbent Bike Scifit level 3.5 with UE's and LE's      Knee/Hip Exercises: Standing   Heel Raises Both;1 set;10 reps                 Balance Exercises - 12/15/21  0001       Balance Exercises: Standing   Standing Eyes Opened Wide (BOA);Head  turns;Solid surface;5 reps   horizontal head turns 5 reps without UE support   Step Over Hurdles / Cones pt performed stepping over black balance beam with UE support on counter - 5 reps each leg    Other Standing Exercises Tap ups to 1st step 5 reps each foot alternating with UE support on rails    Other Standing Exercises Comments Pt stood at counter - performed touching 3 colored discs on floor with LUE support on counter with CGA - 2 reps each leg for improved SLS                  PT Short Term Goals - 12/14/21 1223       PT SHORT TERM GOAL #1   Title Improve TUG score from 32 secs to </= 27 secs with RW with SBA to demo improved functional mobility. (all STGs due 12-17-21)    Baseline 11/18/21: 30.69 sec's with RW, improved from 32 sec's just not to goals    Status Partially Met    Target Date 12/17/21      PT SHORT TERM GOAL #2   Title Pt will perform 5 times sit to stand transfers from standard chair with only 1 UE support with SBA to demo increased LE strength and balance. Update:  Perform sit to stand without UE support from mat for 3 reps to demo improved LE strength.    Baseline 11/18/21: 17.94 sec's with single UE support from standard height surface with retropulstion noted with each rep, min guard assist for safety    Status Revised    Target Date 12/17/21      PT SHORT TERM GOAL #3   Title Increase Berg balance test score from 13/30 to >/= 19/30 to demo improved balance.  Updated STG; increase score to >/= 38/56    Baseline 11/18/21: 33/56 scored today    Status Revised    Target Date 12/17/21      PT SHORT TERM GOAL #4   Title Increase gait velocity by at least .4 ft/sec with RW to demo improved gait efficiency. Revised updated LTG:  increase gait velocity to >/= 2.0 ft/sec with RW    Baseline 11/02/21: 1.17 ft/sec with RW as baselne:    18.4, 18.82 = 1.74 ft/sec    Time 4    Period  Weeks    Status Revised    Target Date 12/17/21      PT SHORT TERM GOAL #5   Title Amb. 60' with RW with SBA on flat, even surface for increased community accessibility.    Baseline 41' with RW due to fatigue -   11-23-21 = 190' with RW    Time 4    Period Weeks    Status On-going    Target Date 12/17/21      PT SHORT TERM GOAL #6   Title Independent with HEP for strengthening & balance exs.    Baseline 11/18/21: met with current program per pt and CNA    Status Achieved    Target Date 11/19/21               PT Long Term Goals - 12/15/21 1006       PT LONG TERM GOAL #1   Title Improve TUG score to </= 20 secs with RW with supervision to reduce fall risk.    Baseline 32 secs with RW    Time 12    Period Weeks    Status New  Target Date 01/14/22      PT LONG TERM GOAL #2   Title Pt will amb. 500' with RW with SBA on flat, even surface for increased community accessibility.    Baseline 50' with RW    Time 12    Period Weeks    Status New    Target Date 01/14/22      PT LONG TERM GOAL #3   Title Increase Berg balance test score from 13/56 to >/= 28/56 to reduce fall risk.    Time 12    Period Weeks    Status New    Target Date 01/14/22      PT LONG TERM GOAL #4   Title Increase gait velocity by at least 1.0 ft/sec with RW for incr. gait efficiency.    Time 12    Period Weeks    Status New    Target Date 01/14/22      PT LONG TERM GOAL #5   Title Perform sit to stand from chair without UE support to demo improved LE strength and balance.    Time 12    Period Weeks    Status New    Target Date 01/14/22      PT LONG TERM GOAL #6   Title Negotiate 4 steps with 1 hand rail using step by step sequence with SBA.    Time 12    Period Weeks    Status New    Target Date 01/14/22      PT LONG TERM GOAL #7   Title Independent in updated HEP for balance & strengthening.    Time 12    Period Weeks    Status New    Target Date 01/14/22                    Plan - 12/15/21 0959     Clinical Impression Statement Pt unable to tolerate amb. without assistive device due to c/o sciatica in RLE; trialed use of SBQC but pt was too unsteady with this device and was unable to tolerate RUE weight bearing due to c/o Rt wrist pain; will continue to use RW for assistance with ambulation. Pt requires frequent short seated rest breaks during session due to c/o fatigue.  Cont with POC.    Personal Factors and Comorbidities Fitness;Comorbidity 2;Behavior Pattern    Comorbidities CAD, ESRD on HD M-W-Fri, CKD stage V, iron deficiency anemia, DM type 2, bronchial asthma, chronic diastolic CHF, s/p Rt basal ganglia CVA, steal syndrome RUE with fistula implant    Examination-Activity Limitations Bathing;Bed Mobility;Locomotion Level;Transfers;Reach Overhead;Squat;Stairs;Stand;Toileting;Hygiene/Grooming;Bend;Carry    Examination-Participation Restrictions Cleaning;Community Activity;Driving;Interpersonal Relationship;Laundry;Meal Prep;Shop    Stability/Clinical Decision Making Evolving/Moderate complexity    Rehab Potential Good    PT Frequency 2x / week    PT Duration 12 weeks    PT Treatment/Interventions ADLs/Self Care Home Management;DME Instruction;Gait training;Stair training;Therapeutic activities;Therapeutic exercise;Balance training;Neuromuscular re-education;Patient/family education    PT Next Visit Plan Juliann Pulse - please start checking STG's -  I will finish what you don't complete;  pt needs to schedule -- I started follow up note for scheduling but didn't get chance to talk to OT [facility]):  continue with use of scifit/nustep for strengthening/activity tolerance; continue to address strengthening and balance;    PT Home Exercise Plan Access Code: [email or web address]    Consulted and Agree with Plan of Care Patient   CNA - Ms. Sherrod            Patient will benefit  from skilled therapeutic intervention in order to improve the following deficits and  impairments:  Abnormal gait, Decreased activity tolerance, Decreased balance, Decreased strength, Decreased endurance, Decreased range of motion, Impaired sensation, Impaired UE functional use  Visit Diagnosis: Unsteadiness on feet  Other abnormalities of gait and mobility  Muscle weakness (generalized)     Problem List Patient Active Problem List   Diagnosis Date Noted   Left hemiparesis ([place]) 09/24/2021   Abnormality of gait 09/24/2021   Pain of right hand 08/06/2021   Steal syndrome of dialysis vascular access ([place]) 08/06/2021   Subclavian steal syndrome of right subclavian artery 06/25/2021   Right hand weakness 06/25/2021   Stroke-like symptoms 05/06/2021   Bandemia    Cerebrovascular accident (CVA) of right basal ganglia ([place]) 04/20/2021   External hemorrhoids    Basal ganglia infarction ([place]) 04/16/2021   ESRD on dialysis ([place]) 04/16/2021   Hypertensive urgency 03/10/2021   Hilar enlargement    Anemia of chronic disease    Chronic diastolic CHF (congestive heart failure) ([place]) 03/09/2021   CKD (chronic kidney disease), stage V ([place]) 03/08/2021   Diabetic retinopathy ([place]) 03/08/2021   Hyperglycemia due to type 2 diabetes mellitus ([place]) 03/08/2021   Pure hypercholesterolemia 03/08/2021   Anemia in chronic kidney disease 09/24/2020   Uncontrolled type 2 diabetes mellitus with hyperglycemia ([place]) 03/19/2019   Osteopenia 09/04/2018   Migraine 09/04/2018   Asthma 09/04/2018   Pseudophakia of both eyes 07/17/2018   Pseudophakia of left eye 07/03/2018   Open angle with borderline findings and high glaucoma risk in left eye 07/03/2018   Age-related nuclear cataract of right eye 07/03/2018   CAD (coronary artery disease), native coronary artery 04/27/2017   Abnormal nuclear stress test 04/04/2017   Dyspnea on exertion 03/09/2017   Appendicitis    Age-related hypermature cataract of both eyes 12/20/2016   Uterine leiomyoma 11/27/2012   Dyslipidemia (high LDL; low HDL)  11/25/2011   Obesity (BMI 30-39.9) 11/25/2011   Hypertensive disorder 11/21/2011   Type 2 diabetes mellitus ([place]) 11/21/2011   Abnormal EKG 11/21/2011    Tahirih Lair, [place], PT 12/15/2021, 10:08 AM  [facility] [address] [place], [name], [address] Phone: [phone]   Fax:  [phone]  Name: Carolyn Russo MRN: [id number] Date of Birth: Mar 01, 1951

## 2021-12-16 ENCOUNTER — Encounter: Payer: Self-pay | Admitting: Physical Therapy

## 2021-12-16 ENCOUNTER — Ambulatory Visit: Payer: Medicare PPO | Admitting: Occupational Therapy

## 2021-12-16 ENCOUNTER — Ambulatory Visit: Payer: Medicare PPO | Admitting: Physical Therapy

## 2021-12-16 ENCOUNTER — Other Ambulatory Visit: Payer: Self-pay

## 2021-12-16 VITALS — BP 180/84

## 2021-12-16 DIAGNOSIS — M79641 Pain in right hand: Secondary | ICD-10-CM

## 2021-12-16 DIAGNOSIS — R278 Other lack of coordination: Secondary | ICD-10-CM

## 2021-12-16 DIAGNOSIS — M6281 Muscle weakness (generalized): Secondary | ICD-10-CM

## 2021-12-16 DIAGNOSIS — R2689 Other abnormalities of gait and mobility: Secondary | ICD-10-CM

## 2021-12-16 DIAGNOSIS — I69354 Hemiplegia and hemiparesis following cerebral infarction affecting left non-dominant side: Secondary | ICD-10-CM | POA: Diagnosis not present

## 2021-12-16 DIAGNOSIS — R208 Other disturbances of skin sensation: Secondary | ICD-10-CM

## 2021-12-16 DIAGNOSIS — R2681 Unsteadiness on feet: Secondary | ICD-10-CM

## 2021-12-16 NOTE — Therapy (Signed)
[facility] [address] [place], [name], [address] Phone: [phone]   Fax:  [phone]  Occupational Therapy Treatment  Patient Details  Name: Carolyn Russo MRN: [id number] Date of Birth: 1951-07-04 Referring Provider (OT): Dr. Dagoberto Ligas   Encounter Date: 12/16/2021   OT End of Session - 12/16/21 1449     Visit Number 11    Number of Visits 25    Date for OT Re-Evaluation 01/13/22    Authorization Type Medicare, State BCBS    Authorization - Visit Number 11    Authorization - Number of Visits 10    Progress Note Due on Visit 20    OT Start Time 1150   2 units only, pt in BR   OT Stop Time 1230    OT Time Calculation (min) 40 min    Activity Tolerance Patient tolerated treatment well    Behavior During Therapy [facility] for tasks assessed/performed             Past Medical History:  Diagnosis Date   Acute GI bleeding    Allergy    Anemia    Anterior chest wall pain    Appendicitis 1965   Asthma    Body mass index 37.0-37.9, adult    Breast pain    Cataract    both eyes   CHF (congestive heart failure) ([place])    Chronic kidney disease    stage 5 - on dialysis   Cognitive change 04/20/2021   r/t cva 03/2821   Dehydration 2014   Deviated septum 1971   Diabetes mellitus    no meds   Dyspnea 2014   Extrinsic asthma    WITH ASTHMA ATTACK   Fibroid 1980   GERD (gastroesophageal reflux disease)    Heart murmur    Hx gestational diabetes    Hyperlipidemia    Hypertension 2014   Inguinal hernia 1959   Malaise and fatigue 2014   Non-IgE mediated allergic asthma 2014   Obesity    Pelvic pain    Pregnancy, high-risk 1985   Stroke ([place]) 04/20/2021   (CVA) of right basal ganglia   Tonsillitis 1968   Uterine fibroid 1980    Past Surgical History:  Procedure Laterality Date   APPENDECTOMY  1751   [place] Right 04/30/2021   Procedure: RIGHT FIRST STAGE [place];   Surgeon: Angelia Mould, MD;  Location: [facility] OR;  Service: Vascular;  Laterality: Right;   CESAREAN SECTION  1985   COLONOSCOPY     ESOPHAGOGASTRODUODENOSCOPY (EGD) WITH PROPOFOL N/A 04/18/2021   Procedure: ESOPHAGOGASTRODUODENOSCOPY (EGD) WITH PROPOFOL;  Surgeon: Gatha Mayer, MD;  Location: [facility] ENDOSCOPY;  Service: Endoscopy;  Laterality: N/A;   EYE SURGERY     bilateral cataract    FLEXIBLE SIGMOIDOSCOPY N/A 04/18/2021   Procedure: FLEXIBLE SIGMOIDOSCOPY;  Surgeon: Gatha Mayer, MD;  Location: [facility] ENDOSCOPY;  Service: Endoscopy;  Laterality: N/A;   HERNIA REPAIR  1959   IR FLUORO GUIDE CV LINE RIGHT  03/18/2021   IR US GUIDE VASC ACCESS RIGHT  03/18/2021   LEFT HEART CATH AND CORONARY ANGIOGRAPHY N/A 04/04/2017   Procedure: Left Heart Cath and Coronary Angiography;  Surgeon: Belva Crome, MD;  Location: [facility] CV LAB;  Service: Cardiovascular;  Laterality: N/A;   MYOMECTOMY  1980, 2004, 2007   RHINOPLASTY  1971   ROTATOR CUFF REPAIR  2003   SURGICAL REPAIR OF HEMORRHAGE  2015   TONSILLECTOMY  1968  There were no vitals filed for this visit.   Subjective Assessment - 12/16/21 1446     Subjective  Pt requests to go to the bathroom first    Pertinent History [name] is a 71 y.o. female with PMHx of obesity, DM, HTN, HLD, ESRD on HD who presented with left sided weakness for 3 days on 04/16/2021. Pt was diagnosed with R basal ganglia infarct Pt went to rehab but then transferred back to acute service on 7/13 for likely seizure. Pt returned to CIR and was d/c home on 05/27/21. Pt received HHOT and PT. Pt has R hand weakness s/p fistula placement. PMH: diabetes, hypertension, hyperlipidemia, ESRD on HD MWF.    Patient Stated Goals to be like I was prior to my stroke    Currently in Pain? Yes    Pain Score 5     Pain Location Wrist    Pain Orientation Right    Pain Descriptors / Indicators Aching    Pain Type Chronic pain    Pain Onset More than a month ago     Pain Frequency Intermittent    Aggravating Factors  pressure through hand    Pain Relieving Factors rest, inactivity                       Treatment: Pt and caregiver went to BR initally Seated Closed chain chest press and reach for floor with bilateral UE's and cane, min v.c Therapist checked pt BP  with dynamap=199/ 109, however subsequent manual readings 185/75, and 178/70. Pt did not have any new CVA symptoms. Seated pt performed simulated feeding task with RUE and foam grip, min -mod v.c Flipping playing cards with right and left UE's, min v.c Placing yellow and red clothespins on rack for sustained pinch with RUE, min v.c.              OT Short Term Goals - 12/07/21 1220       OT SHORT TERM GOAL #1   Title I with initial HEP    Time 4    Period Weeks    Status Achieved    Target Date 11/18/21      OT SHORT TERM GOAL #2   Title Pt will donn shirt  and bathe UB with min A consistently    Time 4    Period Weeks    Status On-going   not consistent     OT SHORT TERM GOAL #3   Title Pt will be I with bilateral UE positioning including splinting  to minimize pain and risk for contracture and injury.    Time 4    Period Weeks    Status Partially Met   splints have been issued and pt/ caregiver education performed- pt needs reinforcement     OT SHORT TERM GOAL #4   Title Pt will demonstrate 60* LUE shoulder flexion in prep for functional reach    Time 4    Period Weeks    Status On-going   55*     OT SHORT TERM GOAL #5   Title Pt will demonstrate improved bilateral UE functional use as eveidenced by increasing bilateral box/ blocks score by 3 blocks.    Time 4    Period Weeks    Status On-going               OT Long Term Goals - 10/21/21 1350       OT LONG TERM GOAL #  1   Title Pt will perform UB dressing with setup and LB dressing with min A.    Time 12    Period Weeks    Status New      OT LONG TERM GOAL #2   Title Pt will bathe  herself with min A.    Time 12    Period Weeks    Status New      OT LONG TERM GOAL #3   Title Pt will perform simple home management/ cooking tasks with min A.    Time 12    Period Weeks    Status New      OT LONG TERM GOAL #4   Title Pt will retrieve a lightweight object at 75* shoulder flexion with LUE.    Time 12    Period Weeks    Status New      OT LONG TERM GOAL #5   Title Pt will increase RUE grip strength to 15 lbs for increased functional use.    Time 12    Period Weeks    Status New      OT LONG TERM GOAL #6   Title Pt will demonstrate ability to write her name and address with 100% legibility using AE prn.    Time 12    Period Weeks    Status New      OT LONG TERM GOAL #7   Title Pt will utilize RUE at least 75% of the time for ADLS/ IADLS with pain no greater than 4/10.    Time 12    Period Weeks    Status New                   Plan - 12/16/21 1447     Clinical Impression Statement Pt is progressing towards goals. She demonstrates improving UE functional use, functional mobility and alertness.    OT Occupational Profile and History Detailed Assessment- Review of Records and additional review of physical, cognitive, psychosocial history related to current functional performance    Occupational performance deficits (Please refer to evaluation for details): ADL's;IADL's;Play;Social Participation;Rest and Sleep    Body Structure / Function / Physical Skills ADL;Endurance;UE functional use;Balance;Flexibility;Pain;FMC;ROM;Gait;Coordination;GMC;Sensation;Decreased knowledge of precautions;Decreased knowledge of use of DME;IADL;Dexterity;Strength;Mobility    Rehab Potential Good    OT Frequency 2x / week    OT Duration 12 weeks    OT Treatment/Interventions Self-care/ADL training;Ultrasound;DME and/or AE instruction;Patient/family education;Balance training;Passive range of motion;Paraffin;Cryotherapy;Fluidtherapy;Splinting;Functional Mobility  Training;Electrical Stimulation;Moist Heat;Therapeutic exercise;Manual Therapy;Therapeutic activities;Neuromuscular education    Plan check box/ blocks, LUE NMR, R hand functional use    Consulted and Agree with Plan of Care Patient   husband            Patient will benefit from skilled therapeutic intervention in order to improve the following deficits and impairments:   Body Structure / Function / Physical Skills: ADL, Endurance, UE functional use, Balance, Flexibility, Pain, FMC, ROM, Gait, Coordination, GMC, Sensation, Decreased knowledge of precautions, Decreased knowledge of use of DME, IADL, Dexterity, Strength, Mobility       Visit Diagnosis: Other abnormalities of gait and mobility  Muscle weakness (generalized)  Hemiplegia and hemiparesis following cerebral infarction affecting left non-dominant side (HCC)  Pain in right hand  Other lack of coordination  Other disturbances of skin sensation    Problem List Patient Active Problem List   Diagnosis Date Noted   Left hemiparesis ([place]) 09/24/2021   Abnormality of gait 09/24/2021   Pain of right hand  08/06/2021   Steal syndrome of dialysis vascular access ([place]) 08/06/2021   Subclavian steal syndrome of right subclavian artery 06/25/2021   Right hand weakness 06/25/2021   Stroke-like symptoms 05/06/2021   Bandemia    Cerebrovascular accident (CVA) of right basal ganglia ([place]) 04/20/2021   External hemorrhoids    Basal ganglia infarction ([place]) 04/16/2021   ESRD on dialysis ([place]) 04/16/2021   Hypertensive urgency 03/10/2021   Hilar enlargement    Anemia of chronic disease    Chronic diastolic CHF (congestive heart failure) ([place]) 03/09/2021   CKD (chronic kidney disease), stage V ([place]) 03/08/2021   Diabetic retinopathy ([place]) 03/08/2021   Hyperglycemia due to type 2 diabetes mellitus ([place]) 03/08/2021   Pure hypercholesterolemia 03/08/2021   Anemia in chronic kidney disease 09/24/2020   Uncontrolled type 2  diabetes mellitus with hyperglycemia ([place]) 03/19/2019   Osteopenia 09/04/2018   Migraine 09/04/2018   Asthma 09/04/2018   Pseudophakia of both eyes 07/17/2018   Pseudophakia of left eye 07/03/2018   Open angle with borderline findings and high glaucoma risk in left eye 07/03/2018   Age-related nuclear cataract of right eye 07/03/2018   CAD (coronary artery disease), native coronary artery 04/27/2017   Abnormal nuclear stress test 04/04/2017   Dyspnea on exertion 03/09/2017   Appendicitis    Age-related hypermature cataract of both eyes 12/20/2016   Uterine leiomyoma 11/27/2012   Dyslipidemia (high LDL; low HDL) 11/25/2011   Obesity (BMI 30-39.9) 11/25/2011   Hypertensive disorder 11/21/2011   Type 2 diabetes mellitus ([place]) 11/21/2011   Abnormal EKG 11/21/2011    Timofey Carandang, OT 12/16/2021, 2:50 PM  [facility] [address] [place], [name], [address] Phone: [phone]   Fax:  [phone]  Name: Carolyn Russo MRN: [id number] Date of Birth: 12/17/1950

## 2021-12-16 NOTE — Therapy (Signed)
[facility] [address] [place], [name], [address] Phone: [phone]   Fax:  [phone]  Physical Therapy Treatment  Patient Details  Name: Carolyn Russo MRN: [id number] Date of Birth: [birth year] Referring Provider (PT): Courtney Heys, MD   Encounter Date: 12/16/2021   PT End of Session - 12/16/21 1236     Visit Number 13    Number of Visits 25    Date for PT Re-Evaluation 01/14/22    Authorization Type [employer] submitted    Authorization Time Period 10/24/21- 01/14/22    Authorization - Visit Number 11    Authorization - Number of Visits 24    Progress Note Due on Visit 87    PT Start Time 1235    PT Stop Time 1314    PT Time Calculation (min) 39 min    Equipment Utilized During Treatment Gait belt    Activity Tolerance Patient tolerated treatment well    Behavior During Therapy WFL for tasks assessed/performed             Past Medical History:  Diagnosis Date   Acute GI bleeding    Allergy    Anemia    Anterior chest wall pain    Appendicitis 1965   Asthma    Body mass index 37.0-37.9, adult    Breast pain    Cataract    both eyes   CHF (congestive heart failure) ([place])    Chronic kidney disease    stage 5 - on dialysis   Cognitive change 04/20/2021   r/t cva 03/2821   Dehydration 2014   Deviated septum 1971   Diabetes mellitus    no meds   Dyspnea 2014   Extrinsic asthma    WITH ASTHMA ATTACK   Fibroid 1980   GERD (gastroesophageal reflux disease)    Heart murmur    Hx gestational diabetes    Hyperlipidemia    Hypertension 2014   Inguinal hernia 1959   Malaise and fatigue 2014   Non-IgE mediated allergic asthma 2014   Obesity    Pelvic pain    Pregnancy, high-risk 1985   Stroke ([place]) 04/20/2021   (CVA) of right basal ganglia   Tonsillitis 1968   Uterine fibroid 1980    Past Surgical History:  Procedure Laterality Date   APPENDECTOMY  5188   BASCILIC VEIN  TRANSPOSITION Right 04/30/2021   Procedure: RIGHT FIRST STAGE [place];  Surgeon: Angelia Mould, MD;  Location: [facility] OR;  Service: Vascular;  Laterality: Right;   CESAREAN SECTION  1985   COLONOSCOPY     ESOPHAGOGASTRODUODENOSCOPY (EGD) WITH PROPOFOL N/A 04/18/2021   Procedure: ESOPHAGOGASTRODUODENOSCOPY (EGD) WITH PROPOFOL;  Surgeon: Gatha Mayer, MD;  Location: [facility] ENDOSCOPY;  Service: Endoscopy;  Laterality: N/A;   EYE SURGERY     bilateral cataract    FLEXIBLE SIGMOIDOSCOPY N/A 04/18/2021   Procedure: FLEXIBLE SIGMOIDOSCOPY;  Surgeon: Gatha Mayer, MD;  Location: [facility] ENDOSCOPY;  Service: Endoscopy;  Laterality: N/A;   HERNIA REPAIR  1959   IR FLUORO GUIDE CV LINE RIGHT  03/18/2021   IR US GUIDE VASC ACCESS RIGHT  03/18/2021   LEFT HEART CATH AND CORONARY ANGIOGRAPHY N/A 04/04/2017   Procedure: Left Heart Cath and Coronary Angiography;  Surgeon: Belva Crome, MD;  Location: [facility] CV LAB;  Service: Cardiovascular;  Laterality: N/A;   MYOMECTOMY  1980, 2004, 2007   [place]  2003  SURGICAL REPAIR OF HEMORRHAGE  2015   TONSILLECTOMY  1968    Vitals:   12/16/21 1235 12/16/21 1250  BP: (!) 192/94 (!) 180/84     Subjective Assessment - 12/16/21 1235     Subjective No new complaitns. No falls. Continues with generalized pain, mostly in the wrist.    Patient is accompained by: Family member   CNA [employer]   Pertinent History ESRD with HD M-W-Fri:, obesity, DM type 2, CAD, iron deficiency anemia, bronchial asthma, chronic diastolic CHF, Rt basal ganglia CVA on 04-16-21, steal syndrome RUE due to dialysis vascular access (s/p fistula placement), CKD stage V:  Rt hand weakness; Lt hemiparesis due to CVA    Patient Stated Goals "be independent with mobility and self care"; "be able to drive again"    Currently in Pain? Yes    Pain Score 5     Pain Location Wrist    Pain Orientation Right    Pain Descriptors / Indicators Aching;Sore     Pain Type Chronic pain    Pain Onset More than a month ago    Pain Frequency Intermittent    Aggravating Factors  pressure through hand, certain movements of hand and shoulder    Pain Relieving Factors rest, inactivity                [facility] PT Assessment - 12/16/21 1237       Berg Balance Test   Sit to Stand Able to stand without using hands and stabilize independently    Standing Unsupported Able to stand 2 minutes with supervision    Sitting with Back Unsupported but Feet Supported on Floor or Stool Able to sit safely and securely 2 minutes    Stand to Sit Sits safely with minimal use of hands    Transfers Able to transfer safely, definite need of hands    Standing Unsupported with Eyes Closed Able to stand 10 seconds with supervision    Standing Unsupported with Feet Together Able to place feet together independently and stand for 1 minute with supervision    From Standing, Reach Forward with Outstretched Arm Can reach forward >12 cm safely (5")   6-7 inches   From Standing Position, Pick up Object from Floor Able to pick up shoe, needs supervision    From Standing Position, Turn to Look Behind Over each Shoulder Looks behind from both sides and weight shifts well    Turn 360 Degrees Able to turn 360 degrees safely but slowly   >6 seconds   Standing Unsupported, Alternately Place Feet on Step/Stool Able to complete >2 steps/needs minimal assist    Standing Unsupported, One Foot in Front Able to plae foot ahead of the other independently and hold 30 seconds    Standing on One Leg Unable to try or needs assist to prevent fall    Total Score 40    Berg comment: 40/56 37-45 significant risk                    [facility] Adult PT Treatment/Exercise - 12/16/21 1237       Transfers   Transfers Sit to Stand;Stand to Sit    Sit to Stand 5: Supervision    Stand to Sit 5: Supervision;With upper extremity assist;To bed;To chair/3-in-1      Ambulation/Gait   Ambulation/Gait  Yes    Ambulation/Gait Assistance 5: Supervision;4: Min guard    Ambulation/Gait Assistance Details no balance issues noted with improved posture noted today.  Ambulation Distance (Feet) 230 Feet   x1, plus around clinic with session.   Assistive device Rolling walker    Gait Pattern Step-through pattern;Decreased hip/knee flexion - left;Decreased step length - left    Ambulation Surface Level;Indoor                       PT Short Term Goals - 12/16/21 2116       PT SHORT TERM GOAL #1   Title Improve TUG score from 32 secs to </= 27 secs with RW with SBA to demo improved functional mobility. (all STGs due 12-17-21)    Baseline 11/18/21: 30.69 sec's with RW, improved from 32 sec's just not to goals    Status On-going    Target Date 12/17/21      PT SHORT TERM GOAL #2   Title Pt will perform 5 times sit to stand transfers from standard chair with only 1 UE support with SBA to demo increased LE strength and balance. Update:  Perform sit to stand without UE support from mat for 3 reps to demo improved LE strength.    Baseline 11/18/21: 17.94 sec's with single UE support from standard height surface with retropulstion noted with each rep, min guard assist for safety    Status On-going    Target Date 12/17/21      PT SHORT TERM GOAL #3   Title Increase Berg balance test score from 13/30 to >/= 19/30 to demo improved balance.  Updated STG; increase score to >/= 38/56    Baseline 12/16/21: 40/56 scored today    Status Achieved      PT SHORT TERM GOAL #4   Title Increase gait velocity by at least .4 ft/sec with RW to demo improved gait efficiency. Revised updated LTG:  increase gait velocity to >/= 2.0 ft/sec with RW    Baseline 11/02/21: 1.17 ft/sec with RW as baselne:    18.4, 18.82 = 1.74 ft/sec    Time 4    Period Weeks    Status On-going    Target Date 12/17/21      PT SHORT TERM GOAL #5   Title Amb. 67' with RW with SBA on flat, even surface for increased community  accessibility.    Baseline 12/16/21: met in session today    Status Achieved      PT SHORT TERM GOAL #6   Title Independent with HEP for strengthening & balance exs.    Baseline 11/18/21: met with current program per pt and CNA    Status Achieved    Target Date 11/19/21               PT Long Term Goals - 12/15/21 1006       PT LONG TERM GOAL #1   Title Improve TUG score to </= 20 secs with RW with supervision to reduce fall risk.    Baseline 32 secs with RW    Time 12    Period Weeks    Status New    Target Date 01/14/22      PT LONG TERM GOAL #2   Title Pt will amb. 500' with RW with SBA on flat, even surface for increased community accessibility.    Baseline 48' with RW    Time 12    Period Weeks    Status New    Target Date 01/14/22      PT LONG TERM GOAL #3   Title Increase Berg balance test score from  13/56 to >/= 28/56 to reduce fall risk.    Time 12    Period Weeks    Status New    Target Date 01/14/22      PT LONG TERM GOAL #4   Title Increase gait velocity by at least 1.0 ft/sec with RW for incr. gait efficiency.    Time 12    Period Weeks    Status New    Target Date 01/14/22      PT LONG TERM GOAL #5   Title Perform sit to stand from chair without UE support to demo improved LE strength and balance.    Time 12    Period Weeks    Status New    Target Date 01/14/22      PT LONG TERM GOAL #6   Title Negotiate 4 steps with 1 hand rail using step by step sequence with SBA.    Time 12    Period Weeks    Status New    Target Date 01/14/22      PT LONG TERM GOAL #7   Title Independent in updated HEP for balance & strengthening.    Time 12    Period Weeks    Status New    Target Date 01/14/22                   Plan - 12/16/21 1237     Clinical Impression Statement Today's skilled session began to check progress toward STGs with goals 3 and 5 met. Will plan to check remaining goals at next session. The pt is making progress toward  goals and should benefit from continued PT to progress toward unmet goals.    Personal Factors and Comorbidities Fitness;Comorbidity 2;Behavior Pattern    Comorbidities CAD, ESRD on HD M-W-Fri, CKD stage V, iron deficiency anemia, DM type 2, bronchial asthma, chronic diastolic CHF, s/p Rt basal ganglia CVA, steal syndrome RUE with fistula implant    Examination-Activity Limitations Bathing;Bed Mobility;Locomotion Level;Transfers;Reach Overhead;Squat;Stairs;Stand;Toileting;Hygiene/Grooming;Bend;Carry    Examination-Participation Restrictions Cleaning;Community Activity;Driving;Interpersonal Relationship;Laundry;Meal Prep;Shop    Stability/Clinical Decision Making Evolving/Moderate complexity    Rehab Potential Good    PT Frequency 2x / week    PT Duration 12 weeks    PT Treatment/Interventions ADLs/Self Care Home Management;DME Instruction;Gait training;Stair training;Therapeutic activities;Therapeutic exercise;Balance training;Neuromuscular re-education;Patient/family education    PT Next Visit Plan check remaining STGs  pt needs to schedule [place] I think she is now scheduled out)  continue with use of scifit/nustep for strengthening/activity tolerance; continue to address strengthening and balance;    PT Home Exercise Plan Access Code: [email or web address]    Consulted and Agree with Plan of Care Patient   CNA - Ms. Sherrod            Patient will benefit from skilled therapeutic intervention in order to improve the following deficits and impairments:  Abnormal gait, Decreased activity tolerance, Decreased balance, Decreased strength, Decreased endurance, Decreased range of motion, Impaired sensation, Impaired UE functional use  Visit Diagnosis: Unsteadiness on feet  Other abnormalities of gait and mobility  Muscle weakness (generalized)  Hemiplegia and hemiparesis following cerebral infarction affecting left non-dominant side [facility])     Problem List Patient Active Problem List   Diagnosis  Date Noted   Left hemiparesis ([place]) 09/24/2021   Abnormality of gait 09/24/2021   Pain of right hand 08/06/2021   Steal syndrome of dialysis vascular access ([place]) 08/06/2021   Subclavian steal syndrome of right subclavian artery 06/25/2021   Right hand weakness  06/25/2021   Stroke-like symptoms 05/06/2021   Bandemia    Cerebrovascular accident (CVA) of right basal ganglia ([place]) 04/20/2021   External hemorrhoids    Basal ganglia infarction ([place]) 04/16/2021   ESRD on dialysis ([place]) 04/16/2021   Hypertensive urgency 03/10/2021   Hilar enlargement    Anemia of chronic disease    Chronic diastolic CHF (congestive heart failure) ([place]) 03/09/2021   CKD (chronic kidney disease), stage V ([place]) 03/08/2021   Diabetic retinopathy ([place]) 03/08/2021   Hyperglycemia due to type 2 diabetes mellitus ([place]) 03/08/2021   Pure hypercholesterolemia 03/08/2021   Anemia in chronic kidney disease 09/24/2020   Uncontrolled type 2 diabetes mellitus with hyperglycemia ([place]) 03/19/2019   Osteopenia 09/04/2018   Migraine 09/04/2018   Asthma 09/04/2018   Pseudophakia of both eyes 07/17/2018   Pseudophakia of left eye 07/03/2018   Open angle with borderline findings and high glaucoma risk in left eye 07/03/2018   Age-related nuclear cataract of right eye 07/03/2018   CAD (coronary artery disease), native coronary artery 04/27/2017   Abnormal nuclear stress test 04/04/2017   Dyspnea on exertion 03/09/2017   Appendicitis    Age-related hypermature cataract of both eyes 12/20/2016   Uterine leiomyoma 11/27/2012   Dyslipidemia (high LDL; low HDL) 11/25/2011   Obesity (BMI 30-39.9) 11/25/2011   Hypertensive disorder 11/21/2011   Type 2 diabetes mellitus ([place]) 11/21/2011   Abnormal EKG 11/21/2011   Willow Ora, PTA, [facility] [address], [place], [place] [address] [phone] 12/16/21, 9:50 PM   Name: Carolyn Russo MRN: [id number] Date of Birth: 11/02/1950

## 2021-12-21 ENCOUNTER — Other Ambulatory Visit: Payer: Self-pay

## 2021-12-21 ENCOUNTER — Ambulatory Visit: Payer: Medicare PPO | Admitting: Occupational Therapy

## 2021-12-21 ENCOUNTER — Ambulatory Visit: Payer: Medicare PPO | Admitting: Physical Therapy

## 2021-12-21 VITALS — BP 184/92

## 2021-12-21 DIAGNOSIS — I69354 Hemiplegia and hemiparesis following cerebral infarction affecting left non-dominant side: Secondary | ICD-10-CM

## 2021-12-21 DIAGNOSIS — R2681 Unsteadiness on feet: Secondary | ICD-10-CM

## 2021-12-21 DIAGNOSIS — M79641 Pain in right hand: Secondary | ICD-10-CM

## 2021-12-21 DIAGNOSIS — R278 Other lack of coordination: Secondary | ICD-10-CM

## 2021-12-21 DIAGNOSIS — M6281 Muscle weakness (generalized): Secondary | ICD-10-CM

## 2021-12-21 DIAGNOSIS — R2689 Other abnormalities of gait and mobility: Secondary | ICD-10-CM

## 2021-12-21 DIAGNOSIS — R208 Other disturbances of skin sensation: Secondary | ICD-10-CM

## 2021-12-21 NOTE — Therapy (Signed)
[facility] [address] [place], [name], [address] Phone: [phone]   Fax:  [phone]  Physical Therapy Treatment  Patient Details  Name: Carolyn Russo MRN: [id number] Date of Birth: Oct 06, 1951 Referring Provider (PT): Courtney Heys, MD   Encounter Date: 12/21/2021   PT End of Session - 12/21/21 1824     Visit Number 14    Number of Visits 25    Date for PT Re-Evaluation 01/14/22    Authorization Type [employer] [place] submitted    Authorization Time Period 10/24/21- 01/14/22    Authorization - Visit Number 12    Authorization - Number of Visits 24    Progress Note Due on Visit 20    PT Start Time 1148    PT Stop Time 1229    PT Time Calculation (min) 41 min    Equipment Utilized During Treatment Gait belt    Activity Tolerance Patient tolerated treatment well    Behavior During Therapy WFL for tasks assessed/performed             Past Medical History:  Diagnosis Date   Acute GI bleeding    Allergy    Anemia    Anterior chest wall pain    Appendicitis 1965   Asthma    Body mass index 37.0-37.9, adult    Breast pain    Cataract    both eyes   CHF (congestive heart failure) ([place])    Chronic kidney disease    stage 5 - on dialysis   Cognitive change 04/20/2021   r/t cva 03/2821   Dehydration 2014   Deviated septum 1971   Diabetes mellitus    no meds   Dyspnea 2014   Extrinsic asthma    WITH ASTHMA ATTACK   Fibroid 1980   GERD (gastroesophageal reflux disease)    Heart murmur    Hx gestational diabetes    Hyperlipidemia    Hypertension 2014   Inguinal hernia 1959   Malaise and fatigue 2014   Non-IgE mediated allergic asthma 2014   Obesity    Pelvic pain    Pregnancy, high-risk 1985   Stroke ([place]) 04/20/2021   (CVA) of right basal ganglia   Tonsillitis 1968   Uterine fibroid 1980    Past Surgical History:  Procedure Laterality Date   APPENDECTOMY  3748   BASCILIC VEIN  TRANSPOSITION Right 04/30/2021   Procedure: RIGHT FIRST STAGE [name];  Surgeon: Angelia Mould, MD;  [facility];  Service: Vascular;  Laterality: Right;   CESAREAN SECTION  1985   COLONOSCOPY     ESOPHAGOGASTRODUODENOSCOPY (EGD) WITH PROPOFOL N/A 04/18/2021   Procedure: ESOPHAGOGASTRODUODENOSCOPY (EGD) WITH PROPOFOL;  Surgeon: Gatha Mayer, MD;  Location: [facility] ENDOSCOPY;  Service: Endoscopy;  Laterality: N/A;   EYE SURGERY     bilateral cataract    FLEXIBLE SIGMOIDOSCOPY N/A 04/18/2021   Procedure: FLEXIBLE SIGMOIDOSCOPY;  Surgeon: Gatha Mayer, MD;  Location: [facility] ENDOSCOPY;  Service: Endoscopy;  Laterality: N/A;   HERNIA REPAIR  1959   IR FLUORO GUIDE CV LINE RIGHT  03/18/2021   IR US GUIDE VASC ACCESS RIGHT  03/18/2021   LEFT HEART CATH AND CORONARY ANGIOGRAPHY N/A 04/04/2017   Procedure: Left Heart Cath and Coronary Angiography;  Surgeon: Belva Crome, MD;  Location: [facility] CV LAB;  Service: Cardiovascular;  Laterality: N/A;   MYOMECTOMY  1980, 2004, 2007   [place]  2003  SURGICAL REPAIR OF HEMORRHAGE  2015   TONSILLECTOMY  1968    There were no vitals filed for this visit.   Subjective Assessment - 12/21/21 1147     Subjective Pt reports she continues to have Rt wrist pain and Rt hip pain due to sciatica    Patient is accompained by: Family member   CNA [employer]   Pertinent History ESRD with HD M-W-Fri:, obesity, DM type 2, CAD, iron deficiency anemia, bronchial asthma, chronic diastolic CHF, Rt basal ganglia CVA on 04-16-21, steal syndrome RUE due to dialysis vascular access (s/p fistula placement), CKD stage V:  Rt hand weakness; Lt hemiparesis due to CVA    Patient Stated Goals "be independent with mobility and self care"; "be able to drive again"    Currently in Pain? Yes    Pain Score 5     Pain Location Wrist    Pain Orientation Right    Pain Descriptors / Indicators Aching;Dull;Sore    Pain Type Chronic pain     Pain Onset More than a month ago    Pain Frequency Intermittent    Pain Score 3    Pain Location Hip    Pain Orientation Right    Pain Descriptors / Indicators Aching;Discomfort;Radiating    Pain Type Chronic pain    Pain Onset More than a month ago    Pain Frequency Intermittent                               [facility] Adult PT Treatment/Exercise - 12/21/21 1209       Transfers   Transfers Sit to Stand;Stand to Sit    Sit to Stand 5: Supervision    Stand to Sit 5: Supervision    Number of Reps 10 reps   5 reps with LUE support from standard chair; 5 reps without UE support from high/low mat table     Ambulation/Gait   Ambulation/Gait Yes    Ambulation/Gait Assistance 5: Supervision    Ambulation/Gait Assistance Details cues to stay close to RW    Ambulation Distance (Feet) 115 Feet   1 lap only due to c/o fatigue; 30' from gym toward lobby at end of session   Assistive device Rolling walker    Gait Pattern Step-through pattern;Decreased hip/knee flexion - left;Decreased step length - left    Ambulation Surface Level;Indoor      Standardized Balance Assessment   Standardized Balance Assessment Timed Up and Go Test      Timed Up and Go Test   TUG Normal TUG    Normal TUG (seconds) 28.15   with RW     Neuro Re-ed    Neuro Re-ed Details  Standing balance exercises at counter with bil. UE support; pt performed alternating side kicks, backward kicks 5 reps each leg ; marching in place 5 reps each leg with UE support on counter.  Pt performed touching 3 balance bubbles with each foot 3 reps each with UE support on counter.  Balance activity - pt stood facing counter - reached for cones on Lt side to place on right end of counter (pt did not step but did reach outside BOS with this activity); then transferrred cones (5) from Rt end of counter to left end - did not step with this activity but did reach outside BOS again.  PT Short  Term Goals - 12/21/21 1147       PT SHORT TERM GOAL #1   Title Improve TUG score from 32 secs to </= 27 secs with RW with SBA to demo improved functional mobility. (all STGs due 12-17-21)    Baseline 11/18/21: 30.69 sec's with RW, improved from 32 sec's just not to goals;   28.15 secs with RW    Status Not Met    Target Date 12/17/21      PT SHORT TERM GOAL #2   Title Pt will perform 5 times sit to stand transfers from standard chair with only 1 UE support with SBA to demo increased LE strength and balance. Update:  Perform sit to stand without UE support from mat for 3 reps to demo improved LE strength.    Baseline 11/18/21: 17.94 sec's with single UE support from standard height surface with retropulstion noted with each rep, min guard assist for safety; met on 12-21-21 with no LOB upon initial standing    Status Achieved    Target Date 12/17/21      PT SHORT TERM GOAL #3   Title Increase Berg balance test score from 13/30 to >/= 19/30 to demo improved balance.  Updated STG; increase score to >/= 38/56    Baseline 12/16/21: 40/56 scored today    Status Achieved      PT SHORT TERM GOAL #4   Title Increase gait velocity by at least .4 ft/sec with RW to demo improved gait efficiency. Revised updated LTG:  increase gait velocity to >/= 2.0 ft/sec with RW    Baseline 11/02/21: 1.17 ft/sec with RW as baselne:    18.4, 18.82 = 1.74 ft/sec.   17.03 secs = 1.92 ft/sec    Time 4    Period Weeks    Status Not Met    Target Date 12/17/21      PT SHORT TERM GOAL #5   Title Amb. 72' with RW with SBA on flat, even surface for increased community accessibility.    Baseline 12/16/21: met in session today    Status Achieved      PT SHORT TERM GOAL #6   Title Independent with HEP for strengthening & balance exs.    Baseline 11/18/21: met with current program per pt and CNA    Status Achieved    Target Date 11/19/21               PT Long Term Goals - 12/21/21 1839       PT LONG TERM GOAL #1    Title Improve TUG score to </= 20 secs with RW with supervision to reduce fall risk.    Baseline 32 secs with RW    Time 12    Period Weeks    Status New    Target Date 01/14/22      PT LONG TERM GOAL #2   Title Pt will amb. 500' with RW with SBA on flat, even surface for increased community accessibility.    Baseline 32' with RW    Time 12    Period Weeks    Status New    Target Date 01/14/22      PT LONG TERM GOAL #3   Title Increase Berg balance test score from 13/56 to >/= 28/56 to reduce fall risk.    Time 12    Period Weeks    Status New    Target Date 01/14/22      PT LONG TERM  GOAL #4   Title Increase gait velocity by at least 1.0 ft/sec with RW for incr. gait efficiency.    Time 12    Period Weeks    Status New    Target Date 01/14/22      PT LONG TERM GOAL #5   Title Perform sit to stand from chair without UE support to demo improved LE strength and balance.    Time 12    Period Weeks    Status New    Target Date 01/14/22      PT LONG TERM GOAL #6   Title Negotiate 4 steps with 1 hand rail using step by step sequence with SBA.    Time 12    Period Weeks    Status New    Target Date 01/14/22      PT LONG TERM GOAL #7   Title Independent in updated HEP for balance & strengthening.    Time 12    Period Weeks    Status New    Target Date 01/14/22                   Plan - 12/21/21 1147     Clinical Impression Statement Session focused on checking remaining STG's; STG #1 improved but pt did not fully meet stated goal as TUG score = 28.15 secs with RW.  Pt did met STG #2, 5 and 6.  STG #4 improved but not fully met per stated goal as gait velocity = 1.92 ft/sec with RW - closely approximated goal as goal set for 2.0 ft/sec with RW.  Pt continues to fatigue easily and requires frequent seated rest periods. Cont with POC.    Personal Factors and Comorbidities Fitness;Comorbidity 2;Behavior Pattern    Comorbidities CAD, ESRD on HD M-W-Fri, CKD stage  V, iron deficiency anemia, DM type 2, bronchial asthma, chronic diastolic CHF, s/p Rt basal ganglia CVA, steal syndrome RUE with fistula implant    Examination-Activity Limitations Bathing;Bed Mobility;Locomotion Level;Transfers;Reach Overhead;Squat;Stairs;Stand;Toileting;Hygiene/Grooming;Bend;Carry    Examination-Participation Restrictions Cleaning;Community Activity;Driving;Interpersonal Relationship;Laundry;Meal Prep;Shop    Stability/Clinical Decision Making Evolving/Moderate complexity    Rehab Potential Good    PT Frequency 2x / week    PT Duration 12 weeks    PT Treatment/Interventions ADLs/Self Care Home Management;DME Instruction;Gait training;Stair training;Therapeutic activities;Therapeutic exercise;Balance training;Neuromuscular re-education;Patient/family education    PT Next Visit Plan continue with use of scifit/nustep for strengthening/activity tolerance; continue to address strengthening and balance;    PT Home Exercise Plan Access Code: [email or web address]    Consulted and Agree with Plan of Care Patient   CNA - Ms. Sherrod            Patient will benefit from skilled therapeutic intervention in order to improve the following deficits and impairments:  Abnormal gait, Decreased activity tolerance, Decreased balance, Decreased strength, Decreased endurance, Decreased range of motion, Impaired sensation, Impaired UE functional use  Visit Diagnosis: Other abnormalities of gait and mobility  Muscle weakness (generalized)  Unsteadiness on feet     Problem List Patient Active Problem List   Diagnosis Date Noted   Left hemiparesis ([place]) 09/24/2021   Abnormality of gait 09/24/2021   Pain of right hand 08/06/2021   Steal syndrome of dialysis vascular access ([place]) 08/06/2021   Subclavian steal syndrome of right subclavian artery 06/25/2021   Right hand weakness 06/25/2021   Stroke-like symptoms 05/06/2021   Bandemia    Cerebrovascular accident (CVA) of right basal ganglia ([place])  04/20/2021   External hemorrhoids  Basal ganglia infarction ([place]) 04/16/2021   ESRD on dialysis ([place]) 04/16/2021   Hypertensive urgency 03/10/2021   Hilar enlargement    Anemia of chronic disease    Chronic diastolic CHF (congestive heart failure) ([place]) 03/09/2021   CKD (chronic kidney disease), stage V ([place]) 03/08/2021   Diabetic retinopathy ([place]) 03/08/2021   Hyperglycemia due to type 2 diabetes mellitus ([place]) 03/08/2021   Pure hypercholesterolemia 03/08/2021   Anemia in chronic kidney disease 09/24/2020   Uncontrolled type 2 diabetes mellitus with hyperglycemia ([place]) 03/19/2019   Osteopenia 09/04/2018   Migraine 09/04/2018   Asthma 09/04/2018   Pseudophakia of both eyes 07/17/2018   Pseudophakia of left eye 07/03/2018   Open angle with borderline findings and high glaucoma risk in left eye 07/03/2018   Age-related nuclear cataract of right eye 07/03/2018   CAD (coronary artery disease), native coronary artery 04/27/2017   Abnormal nuclear stress test 04/04/2017   Dyspnea on exertion 03/09/2017   Appendicitis    Age-related hypermature cataract of both eyes 12/20/2016   Uterine leiomyoma 11/27/2012   Dyslipidemia (high LDL; low HDL) 11/25/2011   Obesity (BMI 30-39.9) 11/25/2011   Hypertensive disorder 11/21/2011   Type 2 diabetes mellitus ([place]) 11/21/2011   Abnormal EKG 11/21/2011    Ike Maragh, [place], PT 12/21/2021, 7:11 PM  [facility] [address] [place], [name], [address] Phone: [phone]   Fax:  [phone]  Name: Carolyn Russo MRN: [id number] Date of Birth: Apr 25, 1951

## 2021-12-21 NOTE — Therapy (Signed)
[facility] [address] [place], [name], [address] Phone: [phone]   Fax:  [phone]  Occupational Therapy Treatment  Patient Details  Name: Carolyn Russo MRN: [id number] Date of Birth: 12-02-1950 Referring Provider (OT): Dr. Dagoberto Ligas   Encounter Date: 12/21/2021   OT End of Session - 12/21/21 1130     Visit Number 12    Number of Visits 25    Date for OT Re-Evaluation 01/13/22    Authorization Type Medicare, [place] - Visit Number 12    Progress Note Due on Visit 20    OT Start Time 1103    OT Stop Time 1143    OT Time Calculation (min) 40 min             Past Medical History:  Diagnosis Date   Acute GI bleeding    Allergy    Anemia    Anterior chest wall pain    Appendicitis 1965   Asthma    Body mass index 37.0-37.9, adult    Breast pain    Cataract    both eyes   CHF (congestive heart failure) ([place])    Chronic kidney disease    stage 5 - on dialysis   Cognitive change 04/20/2021   r/t cva 03/2821   Dehydration 2014   Deviated septum 1971   Diabetes mellitus    no meds   Dyspnea 2014   Extrinsic asthma    WITH ASTHMA ATTACK   Fibroid 1980   GERD (gastroesophageal reflux disease)    Heart murmur    Hx gestational diabetes    Hyperlipidemia    Hypertension 2014   Inguinal hernia 1959   Malaise and fatigue 2014   Non-IgE mediated allergic asthma 2014   Obesity    Pelvic pain    Pregnancy, high-risk 1985   Stroke ([place]) 04/20/2021   (CVA) of right basal ganglia   Tonsillitis 1968   Uterine fibroid 1980    Past Surgical History:  Procedure Laterality Date   APPENDECTOMY  0340   BASCILIC VEIN TRANSPOSITION Right 04/30/2021   Procedure: RIGHT FIRST STAGE [place];  Surgeon: Carolyn Mould, MD;  Location: [facility] OR;  Service: Vascular;  Laterality: Right;   CESAREAN SECTION  1985   COLONOSCOPY     ESOPHAGOGASTRODUODENOSCOPY (EGD) WITH  PROPOFOL N/A 04/18/2021   Procedure: ESOPHAGOGASTRODUODENOSCOPY (EGD) WITH PROPOFOL;  Surgeon: Gatha Mayer, MD;  Location: [facility] ENDOSCOPY;  Service: Endoscopy;  Laterality: N/A;   EYE SURGERY     bilateral cataract    FLEXIBLE SIGMOIDOSCOPY N/A 04/18/2021   Procedure: FLEXIBLE SIGMOIDOSCOPY;  Surgeon: Gatha Mayer, MD;  Location: [facility] ENDOSCOPY;  Service: Endoscopy;  Laterality: N/A;   HERNIA REPAIR  1959   IR FLUORO GUIDE CV LINE RIGHT  03/18/2021   IR US GUIDE VASC ACCESS RIGHT  03/18/2021   LEFT HEART CATH AND CORONARY ANGIOGRAPHY N/A 04/04/2017   Procedure: Left Heart Cath and Coronary Angiography;  Surgeon: Belva Crome, MD;  Location: [facility] CV LAB;  Service: Cardiovascular;  Laterality: N/A;   MYOMECTOMY  1980, 2004, 2007   RHINOPLASTY  1971   ROTATOR CUFF REPAIR  2003   SURGICAL REPAIR OF HEMORRHAGE  2015   TONSILLECTOMY  1968    Vitals:   12/21/21 1129  BP: (!) 184/92     Subjective Assessment - 12/21/21 1114     Subjective  pain in right wrist    Pertinent  History Carolyn Russo is a 71 y.o. female with PMHx of obesity, DM, HTN, HLD, ESRD on HD who presented with left sided weakness for 3 days on 04/16/2021. Pt was diagnosed with R basal ganglia infarct Pt went to rehab but then transferred back to acute service on 7/13 for likely seizure. Pt returned to CIR and was d/c home on 05/27/21. Pt received HHOT and PT. Pt has R hand weakness s/p fistula placement. PMH: diabetes, hypertension, hyperlipidemia, ESRD on HD MWF.    Patient Stated Goals to be like I was prior to my stroke    Currently in Pain? Yes    Pain Location Wrist    Pain Orientation Right    Pain Descriptors / Indicators Aching    Pain Type Chronic pain    Pain Onset More than a month ago    Pain Frequency Intermittent    Aggravating Factors  pressure through hand    Pain Relieving Factors rest                      Treatment: Low range shoulder flexion, circumduction and shoulder  horizontal abduction with UE ranger. Functional reaching to place large and medium pegs into semi circle with LUE then removing with RUE , min-mod difficulty/ drops. Crumpling washcloth with RUE then flattening for finger flexion/ extension, min v.c  Chest press with cane with bilateral UE's Folding a towel with mod difficulty, then folding washcloths with min difficulty                  OT Short Term Goals - 12/07/21 1220       OT SHORT TERM GOAL #1   Title I with initial HEP    Time 4    Period Weeks    Status Achieved    Target Date 11/18/21      OT SHORT TERM GOAL #2   Title Pt will donn shirt  and bathe UB with min A consistently    Time 4    Period Weeks    Status On-going   not consistent     OT SHORT TERM GOAL #3   Title Pt will be I with bilateral UE positioning including splinting  to minimize pain and risk for contracture and injury.    Time 4    Period Weeks    Status Partially Met   splints have been issued and pt/ caregiver education performed- pt needs reinforcement     OT SHORT TERM GOAL #4   Title Pt will demonstrate 60* LUE shoulder flexion in prep for functional reach    Time 4    Period Weeks    Status On-going   55*     OT SHORT TERM GOAL #5   Title Pt will demonstrate improved bilateral UE functional use as eveidenced by increasing bilateral box/ blocks score by 3 blocks.    Time 4    Period Weeks    Status On-going               OT Long Term Goals - 10/21/21 1350       OT LONG TERM GOAL #1   Title Pt will perform UB dressing with setup and LB dressing with min A.    Time 12    Period Weeks    Status New      OT LONG TERM GOAL #2   Title Pt will bathe herself with min A.    Time 12  Period Weeks    Status New      OT LONG TERM GOAL #3   Title Pt will perform simple home management/ cooking tasks with min A.    Time 12    Period Weeks    Status New      OT LONG TERM GOAL #4   Title Pt will retrieve a lightweight  object at 75* shoulder flexion with LUE.    Time 12    Period Weeks    Status New      OT LONG TERM GOAL #5   Title Pt will increase RUE grip strength to 15 lbs for increased functional use.    Time 12    Period Weeks    Status New      OT LONG TERM GOAL #6   Title Pt will demonstrate ability to write her name and address with 100% legibility using AE prn.    Time 12    Period Weeks    Status New      OT LONG TERM GOAL #7   Title Pt will utilize RUE at least 75% of the time for ADLS/ IADLS with pain no greater than 4/10.    Time 12    Period Weeks    Status New                   Plan - 12/21/21 1214     Clinical Impression Statement Pt is progressing towards goals. She demonstrates improving UE functional use, functional mobility and alertness.    OT Occupational Profile and History Detailed Assessment- Review of Records and additional review of physical, cognitive, psychosocial history related to current functional performance    Occupational performance deficits (Please refer to evaluation for details): ADL's;IADL's;Play;Social Participation;Rest and Sleep    Body Structure / Function / Physical Skills ADL;Endurance;UE functional use;Balance;Flexibility;Pain;FMC;ROM;Gait;Coordination;GMC;Sensation;Decreased knowledge of precautions;Decreased knowledge of use of DME;IADL;Dexterity;Strength;Mobility    Rehab Potential Good    OT Frequency 2x / week    OT Duration 12 weeks    OT Treatment/Interventions Self-care/ADL training;Ultrasound;DME and/or AE instruction;Patient/family education;Balance training;Passive range of motion;Paraffin;Cryotherapy;Fluidtherapy;Splinting;Functional Mobility Training;Electrical Stimulation;Moist Heat;Therapeutic exercise;Manual Therapy;Therapeutic activities;Neuromuscular education    Plan check box/ blocks, LUE NMR, R hand functional use    Consulted and Agree with Plan of Care Patient   husband            Patient will benefit from  skilled therapeutic intervention in order to improve the following deficits and impairments:   Body Structure / Function / Physical Skills: ADL, Endurance, UE functional use, Balance, Flexibility, Pain, FMC, ROM, Gait, Coordination, GMC, Sensation, Decreased knowledge of precautions, Decreased knowledge of use of DME, IADL, Dexterity, Strength, Mobility       Visit Diagnosis: Muscle weakness (generalized)  Hemiplegia and hemiparesis following cerebral infarction affecting left non-dominant side (HCC)  Pain in right hand  Other lack of coordination  Other disturbances of skin sensation  Unsteadiness on feet    Problem List Patient Active Problem List   Diagnosis Date Noted   Left hemiparesis ([place]) 09/24/2021   Abnormality of gait 09/24/2021   Pain of right hand 08/06/2021   Steal syndrome of dialysis vascular access ([place]) 08/06/2021   Subclavian steal syndrome of right subclavian artery 06/25/2021   Right hand weakness 06/25/2021   Stroke-like symptoms 05/06/2021   Bandemia    Cerebrovascular accident (CVA) of right basal ganglia ([place]) 04/20/2021   External hemorrhoids    Basal ganglia infarction ([place]) 04/16/2021   ESRD on  dialysis ([place]) 04/16/2021   Hypertensive urgency 03/10/2021   Hilar enlargement    Anemia of chronic disease    Chronic diastolic CHF (congestive heart failure) ([place]) 03/09/2021   CKD (chronic kidney disease), stage V ([place]) 03/08/2021   Diabetic retinopathy ([place]) 03/08/2021   Hyperglycemia due to type 2 diabetes mellitus ([place]) 03/08/2021   Pure hypercholesterolemia 03/08/2021   Anemia in chronic kidney disease 09/24/2020   Uncontrolled type 2 diabetes mellitus with hyperglycemia ([place]) 03/19/2019   Osteopenia 09/04/2018   Migraine 09/04/2018   Asthma 09/04/2018   Pseudophakia of both eyes 07/17/2018   Pseudophakia of left eye 07/03/2018   Open angle with borderline findings and high glaucoma risk in left eye 07/03/2018   Age-related nuclear  cataract of right eye 07/03/2018   CAD (coronary artery disease), native coronary artery 04/27/2017   Abnormal nuclear stress test 04/04/2017   Dyspnea on exertion 03/09/2017   Appendicitis    Age-related hypermature cataract of both eyes 12/20/2016   Uterine leiomyoma 11/27/2012   Dyslipidemia (high LDL; low HDL) 11/25/2011   Obesity (BMI 30-39.9) 11/25/2011   Hypertensive disorder 11/21/2011   Type 2 diabetes mellitus ([place]) 11/21/2011   Abnormal EKG 11/21/2011    Delos Klich, OT 12/21/2021, 12:16 PM  [facility] [address] [place], [name], [address] Phone: [phone]   Fax:  [phone]  Name: Carolyn Russo MRN: [id number] Date of Birth: [birth year]

## 2021-12-22 ENCOUNTER — Telehealth: Payer: Self-pay

## 2021-12-22 DIAGNOSIS — Z992 Dependence on renal dialysis: Secondary | ICD-10-CM | POA: Diagnosis not present

## 2021-12-22 DIAGNOSIS — N186 End stage renal disease: Secondary | ICD-10-CM | POA: Diagnosis not present

## 2021-12-22 DIAGNOSIS — N2581 Secondary hyperparathyroidism of renal origin: Secondary | ICD-10-CM | POA: Diagnosis not present

## 2021-12-22 DIAGNOSIS — E1122 Type 2 diabetes mellitus with diabetic chronic kidney disease: Secondary | ICD-10-CM | POA: Diagnosis not present

## 2021-12-22 NOTE — Telephone Encounter (Signed)
The pt was notified that Dr Baird Cancer said to take pepcid to help with her nausea feeling and to let the office know if she has any issues.  ?

## 2021-12-23 ENCOUNTER — Encounter: Payer: Self-pay | Admitting: Occupational Therapy

## 2021-12-23 ENCOUNTER — Other Ambulatory Visit: Payer: Self-pay

## 2021-12-23 ENCOUNTER — Ambulatory Visit: Payer: Medicare PPO | Admitting: Occupational Therapy

## 2021-12-23 ENCOUNTER — Ambulatory Visit: Payer: Medicare PPO | Attending: Internal Medicine | Admitting: Physical Therapy

## 2021-12-23 DIAGNOSIS — R208 Other disturbances of skin sensation: Secondary | ICD-10-CM

## 2021-12-23 DIAGNOSIS — M79641 Pain in right hand: Secondary | ICD-10-CM | POA: Insufficient documentation

## 2021-12-23 DIAGNOSIS — R278 Other lack of coordination: Secondary | ICD-10-CM

## 2021-12-23 DIAGNOSIS — M25512 Pain in left shoulder: Secondary | ICD-10-CM

## 2021-12-23 DIAGNOSIS — M6281 Muscle weakness (generalized): Secondary | ICD-10-CM

## 2021-12-23 DIAGNOSIS — I69354 Hemiplegia and hemiparesis following cerebral infarction affecting left non-dominant side: Secondary | ICD-10-CM | POA: Diagnosis not present

## 2021-12-23 DIAGNOSIS — R2681 Unsteadiness on feet: Secondary | ICD-10-CM | POA: Insufficient documentation

## 2021-12-23 DIAGNOSIS — R2689 Other abnormalities of gait and mobility: Secondary | ICD-10-CM | POA: Diagnosis not present

## 2021-12-23 NOTE — Therapy (Signed)
[facility] [address] [place], [name], [address] Phone: [phone]   Fax:  [phone]  Occupational Therapy Treatment  Patient Details  Name: Carolyn Russo: [id number] Date of Birth: [birth year] Referring Provider (OT): Dr. Dagoberto Ligas   Encounter Date: 12/23/2021   OT End of Session - 12/23/21 1438     Visit Number 13    Number of Visits 25    Date for OT Re-Evaluation 01/13/22    Authorization - Visit Number 13    Progress Note Due on Visit 65    OT Start Time 1147    OT Stop Time 52    OT Time Calculation (min) 43 min             Past Medical History:  Diagnosis Date   Acute GI bleeding    Allergy    Anemia    Anterior chest wall pain    Appendicitis 1965   Asthma    Body mass index 37.0-37.9, adult    Breast pain    Cataract    both eyes   CHF (congestive heart failure) ([place])    Chronic kidney disease    stage 5 - on dialysis   Cognitive change 04/20/2021   r/t cva 03/2821   Dehydration 2014   Deviated septum 1971   Diabetes mellitus    no meds   Dyspnea 2014   Extrinsic asthma    WITH ASTHMA ATTACK   Fibroid 1980   GERD (gastroesophageal reflux disease)    Heart murmur    Hx gestational diabetes    Hyperlipidemia    Hypertension 2014   Inguinal hernia 1959   Malaise and fatigue 2014   Non-IgE mediated allergic asthma 2014   Obesity    Pelvic pain    Pregnancy, high-risk 1985   Stroke ([place]) 04/20/2021   (CVA) of right basal ganglia   Tonsillitis 1968   Uterine fibroid 1980    Past Surgical History:  Procedure Laterality Date   APPENDECTOMY  1165   BASCILIC VEIN TRANSPOSITION Right 04/30/2021   Procedure: RIGHT FIRST STAGE [place];  Surgeon: Angelia Mould, MD;  Location: [facility] OR;  Service: Vascular;  Laterality: Right;   CESAREAN SECTION  1985   COLONOSCOPY     ESOPHAGOGASTRODUODENOSCOPY (EGD) WITH PROPOFOL N/A 04/18/2021   Procedure:  ESOPHAGOGASTRODUODENOSCOPY (EGD) WITH PROPOFOL;  Surgeon: Gatha Mayer, MD;  Location: [facility] ENDOSCOPY;  Service: Endoscopy;  Laterality: N/A;   EYE SURGERY     bilateral cataract    FLEXIBLE SIGMOIDOSCOPY N/A 04/18/2021   Procedure: FLEXIBLE SIGMOIDOSCOPY;  Surgeon: Gatha Mayer, MD;  Location: [facility] ENDOSCOPY;  Service: Endoscopy;  Laterality: N/A;   HERNIA REPAIR  1959   IR FLUORO GUIDE CV LINE RIGHT  03/18/2021   IR US GUIDE VASC ACCESS RIGHT  03/18/2021   LEFT HEART CATH AND CORONARY ANGIOGRAPHY N/A 04/04/2017   Procedure: Left Heart Cath and Coronary Angiography;  Surgeon: Belva Crome, MD;  Location: [facility] CV LAB;  Service: Cardiovascular;  Laterality: N/A;   MYOMECTOMY  1980, 2004, 2007   RHINOPLASTY  1971   ROTATOR CUFF REPAIR  2003   SURGICAL REPAIR OF HEMORRHAGE  2015   TONSILLECTOMY  1968    There were no vitals filed for this visit.   Subjective Assessment - 12/23/21 1149     Pertinent History Carolyn Russo is a 71 y.o. female with PMHx of obesity, DM, HTN, HLD, ESRD on HD  who presented with left sided weakness for 3 days on 04/16/2021. Pt was diagnosed with R basal ganglia infarct Pt went to rehab but then transferred back to acute service on 7/13 for likely seizure. Pt returned to CIR and was d/c home on 05/27/21. Pt received HHOT and PT. Pt has R hand weakness s/p fistula placement. PMH: diabetes, hypertension, hyperlipidemia, ESRD on HD MWF.    Patient Stated Goals to be like I was prior to my stroke    Currently in Pain? Yes    Pain Score 5     Pain Location Wrist    Pain Orientation Right    Pain Descriptors / Indicators Aching    Pain Type Chronic pain    Pain Onset More than a month ago    Pain Frequency Intermittent    Aggravating Factors  pressure through hand    Pain Relieving Factors rest    Multiple Pain Sites Yes    Pain Score 6    Pain Location Shoulder    Pain Orientation Left    Pain Descriptors / Indicators Aching    Pain Type Chronic pain     Pain Onset More than a month ago    Pain Frequency Intermittent    Aggravating Factors  raising arm    Pain Relieving Factors rest                             treatment: supine closed chain shoulder flexion and chest press, min facilitation/ v.c for left  shoulder positioning. Checked short term goal for box and blocks, see goals.     OT Education - 12/23/21 1209     Education Details Reviewed strategies for donning button front shirt, and donning shorts, min A for shirt and mod v.c , min A/ v.c for shorts using reacher              OT Short Term Goals - 12/23/21 1623       OT SHORT TERM GOAL #1   Title I with initial HEP    Time 4    Period Weeks    Status Achieved    Target Date 11/18/21      OT SHORT TERM GOAL #2   Title Pt will donn shirt  and bathe UB with min A consistently    Time 4    Period Weeks    Status On-going   not consistent     OT SHORT TERM GOAL #3   Title Pt will be I with bilateral UE positioning including splinting  to minimize pain and risk for contracture and injury.    Time 4    Period Weeks    Status Partially Met   splints have been issued and pt/ caregiver education performed- pt needs reinforcement     OT SHORT TERM GOAL #4   Title Pt will demonstrate 60* LUE shoulder flexion in prep for functional reach    Time 4    Period Weeks    Status On-going   55*     OT SHORT TERM GOAL #5   Title Pt will demonstrate improved bilateral UE functional use as evidenced by increasing bilateral box/ blocks score by 3 blocks.    Time 4    Period Weeks    Status Achieved   R 20, L 30              OT Long Term Goals - 10/21/21 1350  OT LONG TERM GOAL #1   Title Pt will perform UB dressing with setup and LB dressing with min A.    Time 12    Period Weeks    Status New      OT LONG TERM GOAL #2   Title Pt will bathe herself with min A.    Time 12    Period Weeks    Status New      OT LONG TERM GOAL #3    Title Pt will perform simple home management/ cooking tasks with min A.    Time 12    Period Weeks    Status New      OT LONG TERM GOAL #4   Title Pt will retrieve a lightweight object at 75* shoulder flexion with LUE.    Time 12    Period Weeks    Status New      OT LONG TERM GOAL #5   Title Pt will increase RUE grip strength to 15 lbs for increased functional use.    Time 12    Period Weeks    Status New      OT LONG TERM GOAL #6   Title Pt will demonstrate ability to write her name and address with 100% legibility using AE prn.    Time 12    Period Weeks    Status New      OT LONG TERM GOAL #7   Title Pt will utilize RUE at least 75% of the time for ADLS/ IADLS with pain no greater than 4/10.    Time 12    Period Weeks    Status New                   Plan - 12/23/21 1156     Clinical Impression Statement Pt is progressing towards goals. She demonstrates improving UE functional use . She met her box/ blocks short term goal.    OT Occupational Profile and History Detailed Assessment- Review of Records and additional review of physical, cognitive, psychosocial history related to current functional performance    Occupational performance deficits (Please refer to evaluation for details): ADL's;IADL's;Play;Social Participation;Rest and Sleep    Body Structure / Function / Physical Skills ADL;Endurance;UE functional use;Balance;Flexibility;Pain;FMC;ROM;Gait;Coordination;GMC;Sensation;Decreased knowledge of precautions;Decreased knowledge of use of DME;IADL;Dexterity;Strength;Mobility    Rehab Potential Good    OT Frequency 2x / week    OT Duration 12 weeks    OT Treatment/Interventions Self-care/ADL training;Ultrasound;DME and/or AE instruction;Patient/family education;Balance training;Passive range of motion;Paraffin;Cryotherapy;Fluidtherapy;Splinting;Functional Mobility Training;Electrical Stimulation;Moist Heat;Therapeutic exercise;Manual Therapy;Therapeutic  activities;Neuromuscular education    Plan LUE NMR, R hand functional use    Consulted and Agree with Plan of Care Patient   husband            Patient will benefit from skilled therapeutic intervention in order to improve the following deficits and impairments:   Body Structure / Function / Physical Skills: ADL, Endurance, UE functional use, Balance, Flexibility, Pain, FMC, ROM, Gait, Coordination, GMC, Sensation, Decreased knowledge of precautions, Decreased knowledge of use of DME, IADL, Dexterity, Strength, Mobility       Visit Diagnosis: Hemiplegia and hemiparesis following cerebral infarction affecting left non-dominant side (HCC)  Pain in right hand  Other lack of coordination  Other disturbances of skin sensation  Unsteadiness on feet  Other abnormalities of gait and mobility  Acute pain of left shoulder  Muscle weakness (generalized)    Problem List Patient Active Problem List   Diagnosis Date Noted  Left hemiparesis ([place]) 09/24/2021   Abnormality of gait 09/24/2021   Pain of right hand 08/06/2021   Steal syndrome of dialysis vascular access ([place]) 08/06/2021   Subclavian steal syndrome of right subclavian artery 06/25/2021   Right hand weakness 06/25/2021   Stroke-like symptoms 05/06/2021   Bandemia    Cerebrovascular accident (CVA) of right basal ganglia ([place]) 04/20/2021   External hemorrhoids    Basal ganglia infarction ([place]) 04/16/2021   ESRD on dialysis ([place]) 04/16/2021   Hypertensive urgency 03/10/2021   Hilar enlargement    Anemia of chronic disease    Chronic diastolic CHF (congestive heart failure) ([place]) 03/09/2021   CKD (chronic kidney disease), stage V ([place]) 03/08/2021   Diabetic retinopathy ([place]) 03/08/2021   Hyperglycemia due to type 2 diabetes mellitus ([place]) 03/08/2021   Pure hypercholesterolemia 03/08/2021   Anemia in chronic kidney disease 09/24/2020   Uncontrolled type 2 diabetes mellitus with hyperglycemia ([place]) 03/19/2019    Osteopenia 09/04/2018   Migraine 09/04/2018   Asthma 09/04/2018   Pseudophakia of both eyes 07/17/2018   Pseudophakia of left eye 07/03/2018   Open angle with borderline findings and high glaucoma risk in left eye 07/03/2018   Age-related nuclear cataract of right eye 07/03/2018   CAD (coronary artery disease), native coronary artery 04/27/2017   Abnormal nuclear stress test 04/04/2017   Dyspnea on exertion 03/09/2017   Appendicitis    Age-related hypermature cataract of both eyes 12/20/2016   Uterine leiomyoma 11/27/2012   Dyslipidemia (high LDL; low HDL) 11/25/2011   Obesity (BMI 30-39.9) 11/25/2011   Hypertensive disorder 11/21/2011   Type 2 diabetes mellitus ([place]) 11/21/2011   Abnormal EKG 11/21/2011    Carolyn Russo, OT 12/23/2021, 4:24 PM  [facility] [address] [place], [name], [address] Phone: [phone]   Fax:  [phone]  Name: Carolyn Russo Russo: [id number] Date of Birth: August 25, 1951

## 2021-12-23 NOTE — Therapy (Incomplete)
OUTPATIENT PHYSICAL THERAPY TREATMENT NOTE   Patient Name: Carolyn Russo MRN: [id number] DOB:22-Nov-1950, 71 y.o., female Today's Date: 12/23/2021  PCP: [name], MD REFERRING PROVIDER: [name], MD   PT End of Session - 12/23/21 1112     Number of Visits 25    Date for PT Re-Evaluation 01/14/22    Authorization Type [employer] [place] submitted    Authorization Time Period 10/24/21- 01/14/22    Authorization - Visit Number 12    Authorization - Number of Visits 24    Progress Note Due on Visit 20    Equipment Utilized During Treatment Gait belt    Activity Tolerance Patient tolerated treatment well    Behavior During Therapy [facility] for tasks assessed/performed             Past Medical History:  Diagnosis Date   Acute GI bleeding    Allergy    Anemia    Anterior chest wall pain    Appendicitis 1965   Asthma    Body mass index 37.0-37.9, adult    Breast pain    Cataract    both eyes   CHF (congestive heart failure) ([place])    Chronic kidney disease    stage 5 - on dialysis   Cognitive change 04/20/2021   r/t cva 03/2821   Dehydration 2014   Deviated septum 1971   Diabetes mellitus    no meds   Dyspnea 2014   Extrinsic asthma    WITH ASTHMA ATTACK   Fibroid 1980   GERD (gastroesophageal reflux disease)    Heart murmur    Hx gestational diabetes    Hyperlipidemia    Hypertension 2014   Inguinal hernia 1959   Malaise and fatigue 2014   Non-IgE mediated allergic asthma 2014   Obesity    Pelvic pain    Pregnancy, high-risk 1985   Stroke ([place]) 04/20/2021   (CVA) of right basal ganglia   Tonsillitis 1968   Uterine fibroid 1980   Past Surgical History:  Procedure Laterality Date   APPENDECTOMY  7858   BASCILIC VEIN TRANSPOSITION Right 04/30/2021   Procedure: RIGHT FIRST STAGE [place];  Surgeon: Angelia Mould, MD;  Location: [facility] OR;  Service: Vascular;  Laterality: Right;   CESAREAN SECTION  1985   COLONOSCOPY      ESOPHAGOGASTRODUODENOSCOPY (EGD) WITH PROPOFOL N/A 04/18/2021   Procedure: ESOPHAGOGASTRODUODENOSCOPY (EGD) WITH PROPOFOL;  Surgeon: Gatha Mayer, MD;  Location: [facility] ENDOSCOPY;  Service: Endoscopy;  Laterality: N/A;   EYE SURGERY     bilateral cataract    FLEXIBLE SIGMOIDOSCOPY N/A 04/18/2021   Procedure: FLEXIBLE SIGMOIDOSCOPY;  Surgeon: Gatha Mayer, MD;  Location: [facility] ENDOSCOPY;  Service: Endoscopy;  Laterality: N/A;   HERNIA REPAIR  1959   IR FLUORO GUIDE CV LINE RIGHT  03/18/2021   IR US GUIDE VASC ACCESS RIGHT  03/18/2021   LEFT HEART CATH AND CORONARY ANGIOGRAPHY N/A 04/04/2017   Procedure: Left Heart Cath and Coronary Angiography;  Surgeon: Belva Crome, MD;  Location: [place] CV LAB;  Service: Cardiovascular;  Laterality: N/A;   MYOMECTOMY  1980, 2004, 2007   RHINOPLASTY  1971   ROTATOR CUFF REPAIR  2003   SURGICAL REPAIR OF HEMORRHAGE  2015   TONSILLECTOMY  1968   Patient Active Problem List   Diagnosis Date Noted   Left hemiparesis ([place]) 09/24/2021   Abnormality of gait 09/24/2021   Pain of right hand 08/06/2021   Steal syndrome of dialysis vascular  HCC) 08/06/2021  ° Subclavian steal syndrome of right subclavian artery 06/25/2021  ° Right hand weakness 06/25/2021  ° Stroke-like symptoms 05/06/2021  ° Bandemia   ° Cerebrovascular accident (CVA) of right basal ganglia (HCC) 04/20/2021  ° External hemorrhoids   ° Basal ganglia infarction (HCC) 04/16/2021  ° ESRD on dialysis (HCC) 04/16/2021  ° Hypertensive urgency 03/10/2021  ° Hilar enlargement   ° Anemia of chronic disease   ° Chronic diastolic CHF (congestive heart failure) (HCC) 03/09/2021  ° CKD (chronic kidney disease), stage V (HCC) 03/08/2021  ° Diabetic retinopathy (HCC) 03/08/2021  ° Hyperglycemia due to type 2 diabetes mellitus (HCC) 03/08/2021  ° Pure hypercholesterolemia 03/08/2021  ° Anemia in chronic kidney disease 09/24/2020  ° Uncontrolled type 2 diabetes mellitus with hyperglycemia (HCC) 03/19/2019  °  Osteopenia 09/04/2018  ° Migraine 09/04/2018  ° Asthma 09/04/2018  ° Pseudophakia of both eyes 07/17/2018  ° Pseudophakia of left eye 07/03/2018  ° Open angle with borderline findings and high glaucoma risk in left eye 07/03/2018  ° Age-related nuclear cataract of right eye 07/03/2018  ° CAD (coronary artery disease), native coronary artery 04/27/2017  ° Abnormal nuclear stress test 04/04/2017  ° Dyspnea on exertion 03/09/2017  ° Appendicitis   ° Age-related hypermature cataract of both eyes 12/20/2016  ° Uterine leiomyoma 11/27/2012  ° Dyslipidemia (high LDL; low HDL) 11/25/2011  ° Obesity (BMI 30-39.9) 11/25/2011  ° Hypertensive disorder 11/21/2011  ° Type 2 diabetes mellitus (HCC) 11/21/2011  ° Abnormal EKG 11/21/2011  ° ° °REFERRING DIAG: *** ° °THERAPY DIAG:  °No diagnosis found. ° °PERTINENT HISTORY: *** ° °PRECAUTIONS: *** ° °SUBJECTIVE: "I'm ok- I'm here" ° °PAIN:  °Are you having pain? {yes/no:20286} °NPRS scale: 5/10 °Pain location: Rt wrist °Pain orientation: Right  °PAIN TYPE: aching °Pain description: constant  °Aggravating factors: *** °Relieving factors: *** ° ° ° °TODAY'S TREATMENT:  °12-23-21 °Pt gait trained with RW 230' with cues for posture - to stay close inside RW ° °TherEx: °Performed bil. LE strengthening - 2# weight used - hip flexion, extension and abduction 10 reps each leg; hip flexion with knee flexed 2# weight each leg  ° ° °PATIENT EDUCATION: °Education details: *** °Person educated: {Person educated:25204} °Education method: {Education Method:25205} °Education comprehension: {Education Comprehension:25206} ° ° °HOME EXERCISE PROGRAM: °*** ° ° PT Short Term Goals - 12/21/21 1147   ° °  ° PT SHORT TERM GOAL #1  ° Title Improve TUG score from 32 secs to </= 27 secs with RW with SBA to demo improved functional mobility. (all STGs due 12-17-21)   ° Baseline 11/18/21: 30.69 sec's with RW, improved from 32 sec's just not to goals;   28.15 secs with RW   ° Status Not Met   ° Target Date 12/17/21    °  ° PT SHORT TERM GOAL #2  ° Title Pt will perform 5 times sit to stand transfers from standard chair with only 1 UE support with SBA to demo increased LE strength and balance. Update:  Perform sit to stand without UE support from mat for 3 reps to demo improved LE strength.   ° Baseline 11/18/21: 17.94 sec's with single UE support from standard height surface with retropulstion noted with each rep, min guard assist for safety; met on 12-21-21 with no LOB upon initial standing   ° Status Achieved   ° Target Date 12/17/21   °  ° PT SHORT TERM GOAL #3  ° Title Increase Berg balance test score from   from 13/30 to >/= 19/30 to demo improved balance.  Updated STG; increase score to >/= 38/56    Baseline 12/16/21: 40/56 scored today    Status Achieved      PT SHORT TERM GOAL #4   Title Increase gait velocity by at least .4 ft/sec with RW to demo improved gait efficiency. Revised updated LTG:  increase gait velocity to >/= 2.0 ft/sec with RW    Baseline 11/02/21: 1.17 ft/sec with RW as baselne:    18.4, 18.82 = 1.74 ft/sec.   17.03 secs = 1.92 ft/sec    Time 4    Period Weeks    Status Not Met    Target Date 12/17/21      PT SHORT TERM GOAL #5   Title Amb. 21' with RW with SBA on flat, even surface for increased community accessibility.    Baseline 12/16/21: met in session today    Status Achieved      PT SHORT TERM GOAL #6   Title Independent with HEP for strengthening & balance exs.    Baseline 11/18/21: met with current program per pt and CNA    Status Achieved    Target Date 11/19/21              PT Long Term Goals - 12/21/21 1839       PT LONG TERM GOAL #1   Title Improve TUG score to </= 20 secs with RW with supervision to reduce fall risk.    Baseline 32 secs with RW    Time 12    Period Weeks    Status New    Target Date 01/14/22      PT LONG TERM GOAL #2   Title Pt will amb. 500' with RW with SBA on flat, even surface for increased community accessibility.    Baseline 27' with RW     Time 12    Period Weeks    Status New    Target Date 01/14/22      PT LONG TERM GOAL #3   Title Increase Berg balance test score from 13/56 to >/= 28/56 to reduce fall risk.    Time 12    Period Weeks    Status New    Target Date 01/14/22      PT LONG TERM GOAL #4   Title Increase gait velocity by at least 1.0 ft/sec with RW for incr. gait efficiency.    Time 12    Period Weeks    Status New    Target Date 01/14/22      PT LONG TERM GOAL #5   Title Perform sit to stand from chair without UE support to demo improved LE strength and balance.    Time 12    Period Weeks    Status New    Target Date 01/14/22      PT LONG TERM GOAL #6   Title Negotiate 4 steps with 1 hand rail using step by step sequence with SBA.    Time 12    Period Weeks    Status New    Target Date 01/14/22      PT LONG TERM GOAL #7   Title Independent in updated HEP for balance & strengthening.    Time 12    Period Weeks    Status New    Target Date 01/14/22                 Alda Lea, PT 12/23/2021, 11:12 AM

## 2021-12-24 DIAGNOSIS — N186 End stage renal disease: Secondary | ICD-10-CM | POA: Diagnosis not present

## 2021-12-24 DIAGNOSIS — Z992 Dependence on renal dialysis: Secondary | ICD-10-CM | POA: Diagnosis not present

## 2021-12-24 DIAGNOSIS — N2581 Secondary hyperparathyroidism of renal origin: Secondary | ICD-10-CM | POA: Diagnosis not present

## 2021-12-24 NOTE — Therapy (Signed)
[facility] [address] [place], [name], [address] Phone: [phone]   Fax:  [phone]  Physical Therapy Treatment  Patient Details  Name: Carolyn Russo MRN: [id number] Date of Birth: [birth year] Referring Provider (PT): Courtney Heys, MD   Encounter Date: 12/23/2021   PT End of Session - 12/23/21 1112     Visit Number 15    Number of Visits 25    Date for PT Re-Evaluation 01/14/22    Authorization Type [employer] [place] submitted    Authorization Time Period 10/24/21- 01/14/22    Authorization - Visit Number 12    Authorization - Number of Visits 24    Progress Note Due on Visit 20    PT Start Time 1117   pt in restroom for first 15" of session   PT Stop Time 1146    PT Time Calculation (min) 29 min    Equipment Utilized During Treatment Gait belt    Activity Tolerance Patient tolerated treatment well    Behavior During Therapy WFL for tasks assessed/performed             Past Medical History:  Diagnosis Date   Acute GI bleeding    Allergy    Anemia    Anterior chest wall pain    Appendicitis 1965   Asthma    Body mass index 37.0-37.9, adult    Breast pain    Cataract    both eyes   CHF (congestive heart failure) ([place])    Chronic kidney disease    stage 5 - on dialysis   Cognitive change 04/20/2021   r/t cva 03/2821   Dehydration 2014   Deviated septum 1971   Diabetes mellitus    no meds   Dyspnea 2014   Extrinsic asthma    WITH ASTHMA ATTACK   Fibroid 1980   GERD (gastroesophageal reflux disease)    Heart murmur    Hx gestational diabetes    Hyperlipidemia    Hypertension 2014   Inguinal hernia 1959   Malaise and fatigue 2014   Non-IgE mediated allergic asthma 2014   Obesity    Pelvic pain    Pregnancy, high-risk 1985   Stroke ([place]) 04/20/2021   (CVA) of right basal ganglia   Tonsillitis 1968   Uterine fibroid 1980    Past Surgical History:  Procedure Laterality Date    APPENDECTOMY  4742   BASCILIC VEIN TRANSPOSITION Right 04/30/2021   Procedure: RIGHT FIRST STAGE [place];  Surgeon: Angelia Mould, MD;  Location[facility] OR;  Service: Vascular;  Laterality: Right;   CESAREAN SECTION  1985   COLONOSCOPY     ESOPHAGOGASTRODUODENOSCOPY (EGD) WITH PROPOFOL N/A 04/18/2021   Procedure: ESOPHAGOGASTRODUODENOSCOPY (EGD) WITH PROPOFOL;  Surgeon: Gatha Mayer, MD;  Location: [facility] ENDOSCOPY;  Service: Endoscopy;  Laterality: N/A;   EYE SURGERY     bilateral cataract    FLEXIBLE SIGMOIDOSCOPY N/A 04/18/2021   Procedure: FLEXIBLE SIGMOIDOSCOPY;  Surgeon: Gatha Mayer, MD;  Location: [facility] ENDOSCOPY;  Service: Endoscopy;  Laterality: N/A;   HERNIA REPAIR  1959   IR FLUORO GUIDE CV LINE RIGHT  03/18/2021   IR US GUIDE VASC ACCESS RIGHT  03/18/2021   LEFT HEART CATH AND CORONARY ANGIOGRAPHY N/A 04/04/2017   Procedure: Left Heart Cath and Coronary Angiography;  Surgeon: Belva Crome, MD;  Location: [facility] CV LAB;  Service: Cardiovascular;  Laterality: N/A;   MYOMECTOMY  1980, 2004, 2007   RHINOPLASTY  1971  ROTATOR CUFF REPAIR  2003   SURGICAL REPAIR OF HEMORRHAGE  2015   TONSILLECTOMY  1968    There were no vitals filed for this visit.   Subjective Assessment - 12/24/21 1034     Subjective Pt 15" late for PT appt - was in restroom with CNA, Zigmund Daniel, assisting; pt reports no problems or falls since previous PT session this week    Patient is accompained by: Family member   CNA [employer]   Pertinent History ESRD with HD M-W-Fri:, obesity, DM type 2, CAD, iron deficiency anemia, bronchial asthma, chronic diastolic CHF, Rt basal ganglia CVA on 04-16-21, steal syndrome RUE due to dialysis vascular access (s/p fistula placement), CKD stage V:  Rt hand weakness; Lt hemiparesis due to CVA    Patient Stated Goals "be independent with mobility and self care"; "be able to drive again"    Currently in Pain? Yes    Pain Score 5     Pain Location Wrist    Pain  Orientation Right    Pain Descriptors / Indicators Aching    Pain Type Chronic pain    Pain Onset More than a month ago    Pain Frequency Intermittent    Pain Onset More than a month ago                               [facility] Adult PT Treatment/Exercise - 12/24/21 0001       Transfers   Transfers Sit to Stand;Stand to Sit    Sit to Stand 5: Supervision    Stand to Sit 5: Supervision    Number of Reps Other reps (comment)   3 reps     Ambulation/Gait   Ambulation/Gait Yes    Ambulation/Gait Assistance 5: Supervision    Ambulation/Gait Assistance Details cues to stay close to RW and to increase step length    Ambulation Distance (Feet) 230 Feet    Assistive device Rolling walker    Gait Pattern Step-through pattern;Decreased hip/knee flexion - left;Decreased step length - left    Ambulation Surface Level;Indoor    Ramp 4: Min assist    Ramp Details (indicate cue type and reason) cues to stay close to RW with descension    Curb 4: Min assist    Curb Details (indicate cue type and reason) cues to sequence with curb negotiation with use of RW      Knee/Hip Exercises: Standing   Hip Flexion Stengthening;Right;Left;10 reps;2 sets;Knee bent;Knee straight   2# weight used   Hip Abduction Stengthening;Right;Left;1 set;10 reps;Knee straight   2# weight used   Hip Extension Stengthening;Right;Left;1 set;10 reps;Knee straight   2# weight used                Balance Exercises - 12/24/21 0001       Balance Exercises: Standing   Marching Solid surface;Static;10 reps   with 1 UE support on RW   Other Standing Exercises Pt performed SLS activity - touching 3 balance bubbles with each foot 3 reps each with bil.  UE support on RW; touched 2 balance bubbles (straight ahead) with 1 UE support on RW 3 reps each foot                  PT Short Term Goals - 12/24/21 1054       PT SHORT TERM GOAL #1   Title Improve TUG score from 32 secs to </= 27 secs  with RW  with SBA to demo improved functional mobility. (all STGs due 12-17-21)    Baseline 11/18/21: 30.69 sec's with RW, improved from 32 sec's just not to goals;   28.15 secs with RW    Status Not Met    Target Date 12/17/21      PT SHORT TERM GOAL #2   Title Pt will perform 5 times sit to stand transfers from standard chair with only 1 UE support with SBA to demo increased LE strength and balance. Update:  Perform sit to stand without UE support from mat for 3 reps to demo improved LE strength.    Baseline 11/18/21: 17.94 sec's with single UE support from standard height surface with retropulstion noted with each rep, min guard assist for safety; met on 12-21-21 with no LOB upon initial standing    Status Achieved    Target Date 12/17/21      PT SHORT TERM GOAL #3   Title Increase Berg balance test score from 13/30 to >/= 19/30 to demo improved balance.  Updated STG; increase score to >/= 38/56    Baseline 12/16/21: 40/56 scored today    Status Achieved      PT SHORT TERM GOAL #4   Title Increase gait velocity by at least .4 ft/sec with RW to demo improved gait efficiency. Revised updated LTG:  increase gait velocity to >/= 2.0 ft/sec with RW    Baseline 11/02/21: 1.17 ft/sec with RW as baselne:    18.4, 18.82 = 1.74 ft/sec.   17.03 secs = 1.92 ft/sec    Time 4    Period Weeks    Status Not Met    Target Date 12/17/21      PT SHORT TERM GOAL #5   Title Amb. 55' with RW with SBA on flat, even surface for increased community accessibility.    Baseline 12/16/21: met in session today    Status Achieved      PT SHORT TERM GOAL #6   Title Independent with HEP for strengthening & balance exs.    Baseline 11/18/21: met with current program per pt and CNA    Status Achieved    Target Date 11/19/21               PT Long Term Goals - 12/24/21 1054       PT LONG TERM GOAL #1   Title Improve TUG score to </= 20 secs with RW with supervision to reduce fall risk.    Baseline 32 secs with RW     Time 12    Period Weeks    Status New    Target Date 01/14/22      PT LONG TERM GOAL #2   Title Pt will amb. 500' with RW with SBA on flat, even surface for increased community accessibility.    Baseline 67' with RW    Time 12    Period Weeks    Status New    Target Date 01/14/22      PT LONG TERM GOAL #3   Title Increase Berg balance test score from 13/56 to >/= 28/56 to reduce fall risk.    Time 12    Period Weeks    Status New    Target Date 01/14/22      PT LONG TERM GOAL #4   Title Increase gait velocity by at least 1.0 ft/sec with RW for incr. gait efficiency.    Time 12    Period Weeks  Status New    Target Date 01/14/22      PT LONG TERM GOAL #5   Title Perform sit to stand from chair without UE support to demo improved LE strength and balance.    Time 12    Period Weeks    Status New    Target Date 01/14/22      PT LONG TERM GOAL #6   Title Negotiate 4 steps with 1 hand rail using step by step sequence with SBA.    Time 12    Period Weeks    Status New    Target Date 01/14/22      PT LONG TERM GOAL #7   Title Independent in updated HEP for balance & strengthening.    Time 12    Period Weeks    Status New    Target Date 01/14/22                   Plan - 12/24/21 1043     Clinical Impression Statement PT session focused on LE strengthening and gait training including curb and ramp negotiation.  Pt needs cues to stay close inside RW with ramp negotiation.  Pt continues to need at least 1 UE support for standing balance activities requiring SLS on either leg. Pt also continues to need frequent seated rest periods due to c/o fatigue.  Cont with POC.    Personal Factors and Comorbidities Fitness;Comorbidity 2;Behavior Pattern    Comorbidities CAD, ESRD on HD M-W-Fri, CKD stage V, iron deficiency anemia, DM type 2, bronchial asthma, chronic diastolic CHF, s/p Rt basal ganglia CVA, steal syndrome RUE with fistula implant    Examination-Activity  Limitations Bathing;Bed Mobility;Locomotion Level;Transfers;Reach Overhead;Squat;Stairs;Stand;Toileting;Hygiene/Grooming;Bend;Carry    Examination-Participation Restrictions Cleaning;Community Activity;Driving;Interpersonal Relationship;Laundry;Meal Prep;Shop    Stability/Clinical Decision Making Evolving/Moderate complexity    Rehab Potential Good    PT Frequency 2x / week    PT Duration 12 weeks    PT Treatment/Interventions ADLs/Self Care Home Management;DME Instruction;Gait training;Stair training;Therapeutic activities;Therapeutic exercise;Balance training;Neuromuscular re-education;Patient/family education    PT Next Visit Plan continue with use of scifit/nustep for strengthening/activity tolerance; continue to address strengthening and balance;    PT Home Exercise Plan Access Code: [email or web address]    Consulted and Agree with Plan of Care Patient   CNA - Ms. Sherrod            Patient will benefit from skilled therapeutic intervention in order to improve the following deficits and impairments:  Abnormal gait, Decreased activity tolerance, Decreased balance, Decreased strength, Decreased endurance, Decreased range of motion, Impaired sensation, Impaired UE functional use  Visit Diagnosis: Muscle weakness (generalized)  Unsteadiness on feet  Other abnormalities of gait and mobility     Problem List Patient Active Problem List   Diagnosis Date Noted   Left hemiparesis ([place]) 09/24/2021   Abnormality of gait 09/24/2021   Pain of right hand 08/06/2021   Steal syndrome of dialysis vascular access ([place]) 08/06/2021   Subclavian steal syndrome of right subclavian artery 06/25/2021   Right hand weakness 06/25/2021   Stroke-like symptoms 05/06/2021   Bandemia    Cerebrovascular accident (CVA) of right basal ganglia ([place]) 04/20/2021   External hemorrhoids    Basal ganglia infarction ([place]) 04/16/2021   ESRD on dialysis ([place]) 04/16/2021   Hypertensive urgency 03/10/2021   Hilar  enlargement    Anemia of chronic disease    Chronic diastolic CHF (congestive heart failure) ([place]) 03/09/2021   CKD (chronic kidney disease), stage V ([place]) 03/08/2021  Diabetic retinopathy ([place]) 03/08/2021   Hyperglycemia due to type 2 diabetes mellitus ([place]) 03/08/2021   Pure hypercholesterolemia 03/08/2021   Anemia in chronic kidney disease 09/24/2020   Uncontrolled type 2 diabetes mellitus with hyperglycemia ([place]) 03/19/2019   Osteopenia 09/04/2018   Migraine 09/04/2018   Asthma 09/04/2018   Pseudophakia of both eyes 07/17/2018   Pseudophakia of left eye 07/03/2018   Open angle with borderline findings and high glaucoma risk in left eye 07/03/2018   Age-related nuclear cataract of right eye 07/03/2018   CAD (coronary artery disease), native coronary artery 04/27/2017   Abnormal nuclear stress test 04/04/2017   Dyspnea on exertion 03/09/2017   Appendicitis    Age-related hypermature cataract of both eyes 12/20/2016   Uterine leiomyoma 11/27/2012   Dyslipidemia (high LDL; low HDL) 11/25/2011   Obesity (BMI 30-39.9) 11/25/2011   Hypertensive disorder 11/21/2011   Type 2 diabetes mellitus ([place]) 11/21/2011   Abnormal EKG 11/21/2011    Karlye Ihrig, [place], PT 12/24/2021, 10:58 AM  [facility] [address] [place], [name], [address] Phone: [phone]   Fax:  [phone]  Name: Carolyn Russo MRN: [id number] Date of Birth: 1951/09/25

## 2021-12-27 ENCOUNTER — Encounter: Payer: Medicare PPO | Admitting: Physical Medicine and Rehabilitation

## 2021-12-27 DIAGNOSIS — N2581 Secondary hyperparathyroidism of renal origin: Secondary | ICD-10-CM | POA: Diagnosis not present

## 2021-12-27 DIAGNOSIS — Z992 Dependence on renal dialysis: Secondary | ICD-10-CM | POA: Diagnosis not present

## 2021-12-27 DIAGNOSIS — N186 End stage renal disease: Secondary | ICD-10-CM | POA: Diagnosis not present

## 2021-12-29 DIAGNOSIS — Z992 Dependence on renal dialysis: Secondary | ICD-10-CM | POA: Diagnosis not present

## 2021-12-29 DIAGNOSIS — N2581 Secondary hyperparathyroidism of renal origin: Secondary | ICD-10-CM | POA: Diagnosis not present

## 2021-12-29 DIAGNOSIS — N186 End stage renal disease: Secondary | ICD-10-CM | POA: Diagnosis not present

## 2021-12-31 DIAGNOSIS — N2581 Secondary hyperparathyroidism of renal origin: Secondary | ICD-10-CM | POA: Diagnosis not present

## 2021-12-31 DIAGNOSIS — E1165 Type 2 diabetes mellitus with hyperglycemia: Secondary | ICD-10-CM | POA: Diagnosis not present

## 2021-12-31 DIAGNOSIS — Z992 Dependence on renal dialysis: Secondary | ICD-10-CM | POA: Diagnosis not present

## 2021-12-31 DIAGNOSIS — N186 End stage renal disease: Secondary | ICD-10-CM | POA: Diagnosis not present

## 2022-01-03 DIAGNOSIS — N2581 Secondary hyperparathyroidism of renal origin: Secondary | ICD-10-CM | POA: Diagnosis not present

## 2022-01-03 DIAGNOSIS — E1165 Type 2 diabetes mellitus with hyperglycemia: Secondary | ICD-10-CM | POA: Diagnosis not present

## 2022-01-03 DIAGNOSIS — N186 End stage renal disease: Secondary | ICD-10-CM | POA: Diagnosis not present

## 2022-01-03 DIAGNOSIS — Z992 Dependence on renal dialysis: Secondary | ICD-10-CM | POA: Diagnosis not present

## 2022-01-04 ENCOUNTER — Other Ambulatory Visit: Payer: Self-pay

## 2022-01-04 ENCOUNTER — Encounter: Payer: Self-pay | Admitting: Occupational Therapy

## 2022-01-04 ENCOUNTER — Ambulatory Visit: Payer: Medicare PPO | Admitting: Physical Therapy

## 2022-01-04 ENCOUNTER — Ambulatory Visit: Payer: Medicare PPO | Admitting: Occupational Therapy

## 2022-01-04 ENCOUNTER — Encounter: Payer: Self-pay | Admitting: Physical Therapy

## 2022-01-04 DIAGNOSIS — R208 Other disturbances of skin sensation: Secondary | ICD-10-CM | POA: Diagnosis not present

## 2022-01-04 DIAGNOSIS — I69354 Hemiplegia and hemiparesis following cerebral infarction affecting left non-dominant side: Secondary | ICD-10-CM | POA: Diagnosis not present

## 2022-01-04 DIAGNOSIS — R2681 Unsteadiness on feet: Secondary | ICD-10-CM

## 2022-01-04 DIAGNOSIS — M6281 Muscle weakness (generalized): Secondary | ICD-10-CM

## 2022-01-04 DIAGNOSIS — M25512 Pain in left shoulder: Secondary | ICD-10-CM

## 2022-01-04 DIAGNOSIS — M79641 Pain in right hand: Secondary | ICD-10-CM | POA: Diagnosis not present

## 2022-01-04 DIAGNOSIS — R2689 Other abnormalities of gait and mobility: Secondary | ICD-10-CM

## 2022-01-04 DIAGNOSIS — R278 Other lack of coordination: Secondary | ICD-10-CM | POA: Diagnosis not present

## 2022-01-04 NOTE — Therapy (Signed)
[facility], [name], [address] ?Phone: [phone]   Fax:  [phone] ? ?Occupational Therapy Treatment ? ?Patient Details  ?Name: Carolyn Russo ?MRN: [id number] ?Date of Birth: 1951/07/11 ?Referring Provider (OT): Dr. Dagoberto Ligas ? ? ?Encounter Date: 01/04/2022 ? ? OT End of Session - 01/04/22 1403   ? ? Visit Number 14   ? Number of Visits 25   ? Date for OT Re-Evaluation 01/13/22   ? Authorization Type [employer]   ? Authorization Time Period 27 visits 10/24/21 - 02/10/22   ? Authorization - Visit Number 13   ? Progress Note Due on Visit 20   ? OT Start Time 1400   ? OT Stop Time 3614   ? OT Time Calculation (min) 45 min   ? Activity Tolerance Patient tolerated treatment well   ? Behavior During Therapy [facility] for tasks assessed/performed   ? ?  ?  ? ?  ? ? ?Past Medical History:  ?Diagnosis Date  ? Acute GI bleeding   ? Allergy   ? Anemia   ? Anterior chest wall pain   ? Appendicitis 1965  ? Asthma   ? Body mass index 37.0-37.9, adult   ? Breast pain   ? Cataract   ? both eyes  ? CHF (congestive heart failure) ([place])   ? Chronic kidney disease   ? stage 5 - on dialysis  ? Cognitive change 04/20/2021  ? r/t cva 03/2821  ? Dehydration 2014  ? Deviated septum 1971  ? Diabetes mellitus   ? no meds  ? Dyspnea 2014  ? Extrinsic asthma   ? WITH ASTHMA ATTACK  ? Fibroid 1980  ? GERD (gastroesophageal reflux disease)   ? Heart murmur   ? Hx gestational diabetes   ? Hyperlipidemia   ? Hypertension 2014  ? Inguinal hernia 1959  ? Malaise and fatigue 2014  ? Non-IgE mediated allergic asthma 2014  ? Obesity   ? Pelvic pain   ? Pregnancy, high-risk 1985  ? Stroke [facility]) 04/20/2021  ? (CVA) of right basal ganglia  ? Tonsillitis 1968  ? Uterine fibroid 1980  ? ? ?Past Surgical History:  ?Procedure Laterality Date  ? APPENDECTOMY  1959  ? BASCILIC VEIN TRANSPOSITION Right 04/30/2021  ? Procedure: RIGHT FIRST STAGE [place];  Surgeon: Angelia Mould, MD;  Location: [facility];  Service: Vascular;  Laterality: Right;  ? [place]  ? COLONOSCOPY    ? ESOPHAGOGASTRODUODENOSCOPY (EGD) WITH PROPOFOL N/A 04/18/2021  ? Procedure: ESOPHAGOGASTRODUODENOSCOPY (EGD) WITH PROPOFOL;  Surgeon: Gatha Mayer, MD;  Location: [facility] ENDOSCOPY;  Service: Endoscopy;  Laterality: N/A;  ? EYE SURGERY    ? bilateral cataract   ? FLEXIBLE SIGMOIDOSCOPY N/A 04/18/2021  ? Procedure: FLEXIBLE SIGMOIDOSCOPY;  Surgeon: Gatha Mayer, MD;  Location: [facility] ENDOSCOPY;  Service: Endoscopy;  Laterality: N/A;  ? HERNIA REPAIR  1959  ? IR FLUORO GUIDE CV LINE RIGHT  03/18/2021  ? IR US GUIDE VASC ACCESS RIGHT  03/18/2021  ? LEFT HEART CATH AND CORONARY ANGIOGRAPHY N/A 04/04/2017  ? Procedure: Left Heart Cath and Coronary Angiography;  Surgeon: Belva Crome, MD;  Location: [facility] CV LAB;  Service: Cardiovascular;  Laterality: N/A;  ? [place], 2004, 2007  ? RHINOPLASTY  1971  ? ROTATOR CUFF REPAIR  2003  ? SURGICAL REPAIR OF HEMORRHAGE  2015  ? TONSILLECTOMY  1968  ? ? ?There were no vitals  filed for this visit. ? ? Subjective Assessment - 01/04/22 1402   ? ? Subjective  We gonna do therapy right here (at check out)   ? Pertinent History Ms. Kylinn MAKEYLA GOVAN is a 71 y.o. female with PMHx of obesity, DM, HTN, HLD, ESRD on HD who presented with left sided weakness for 3 days on 04/16/2021. Pt was diagnosed with R basal ganglia infarct Pt went to rehab but then transferred back to acute service on 7/13 for likely seizure. Pt returned to CIR and was d/c home on 05/27/21. Pt received HHOT and PT. Pt has R hand weakness s/p fistula placement. PMH: diabetes, hypertension, hyperlipidemia, ESRD on HD MWF.   ? Patient Stated Goals to be like I was prior to my stroke   ? Currently in Pain? Yes   ? Pain Score 5    ? Pain Location Wrist   ? Pain Orientation Right   ? Pain Descriptors / Indicators Aching   ? Pain Type Chronic pain   ? Pain Onset More than a month ago   ? Pain Frequency  Constant   ? ?  ?  ? ?  ? ? ? ? ? ?ADLs Pt reports trying to feed herself more but reports "still making a mess". Pt reports climbing steps to her bed more. Pt reports completing bathing (sponge bath) with approx min A for back and buttocks.  ? ?Physioball forward reaching x 12 for stretching closed chain ? ?Slight Weight bearing in RUE for inhibition of tone and facilitation of muscle activation while reaching with LUE to place resistance clothespins at low level. Pt required no additional assistance for maintaining hand position on mat/pebble for weight bearing. Removed clothespins Red and Yellow with RUE for coordination. ?  ?Low Level Reaching/Coordination with RUE with placing dowels into semicircle pegboard. Pt with min difficulty with white and mod difficulty with red. Pt responded well to cues for releasing hand before pulling back and did better. Worked on reaching with BUE to floor for mini cones for gravity assisted reaching. ? ? ? ? ? ? ? ? ? ? ? ? ? ? ? ? ? ? OT Short Term Goals - 12/23/21 1623   ? ?  ? OT SHORT TERM GOAL #1  ? Title I with initial HEP   ? Time 4   ? Period Weeks   ? Status Achieved   ? Target Date 11/18/21   ?  ? OT SHORT TERM GOAL #2  ? Title Pt will donn shirt  and bathe UB with min A consistently   ? Time 4   ? Period Weeks   ? Status On-going   not consistent  ?  ? OT SHORT TERM GOAL #3  ? Title Pt will be I with bilateral UE positioning including splinting  to minimize pain and risk for contracture and injury.   ? Time 4   ? Period Weeks   ? Status Partially Met   splints have been issued and pt/ caregiver education performed- pt needs reinforcement  ?  ? OT SHORT TERM GOAL #4  ? Title Pt will demonstrate 60* LUE shoulder flexion in prep for functional reach   ? Time 4   ? Period Weeks   ? Status On-going   59*  ?  ? OT SHORT TERM GOAL #5  ? Title Pt will demonstrate improved bilateral UE functional use as evidenced by increasing bilateral box/ blocks score by 3 blocks.   ? Time 4    ?  Period Weeks   ? Status Achieved   R 20, L 30  ? ?  ?  ? ?  ? ? ? ? OT Long Term Goals - 10/21/21 1350   ? ?  ? OT LONG TERM GOAL #1  ? Title Pt will perform UB dressing with setup and LB dressing with min A.   ? Time 12   ? Period Weeks   ? Status New   ?  ? OT LONG TERM GOAL #2  ? Title Pt will bathe herself with min A.   ? Time 12   ? Period Weeks   ? Status New   ?  ? OT LONG TERM GOAL #3  ? Title Pt will perform simple home management/ cooking tasks with min A.   ? Time 12   ? Period Weeks   ? Status New   ?  ? OT LONG TERM GOAL #4  ? Title Pt will retrieve a lightweight object at 75* shoulder flexion with LUE.   ? Time 12   ? Period Weeks   ? Status New   ?  ? OT LONG TERM GOAL #5  ? Title Pt will increase RUE grip strength to 15 lbs for increased functional use.   ? Time 12   ? Period Weeks   ? Status New   ?  ? OT LONG TERM GOAL #6  ? Title Pt will demonstrate ability to write her name and address with 100% legibility using AE prn.   ? Time 12   ? Period Weeks   ? Status New   ?  ? OT LONG TERM GOAL #7  ? Title Pt will utilize RUE at least 75% of the time for ADLS/ IADLS with pain no greater than 4/10.   ? Time 12   ? Period Weeks   ? Status New   ? ?  ?  ? ?  ? ? ? ? ? ? ? ? Plan - 01/04/22 1454   ? ? Clinical Impression Statement Pt progressing towards goals. Pt reports increased independence with some ADLs (dressing, bathing, feeding self, etc).   ? OT Occupational Profile and History Detailed Assessment- Review of Records and additional review of physical, cognitive, psychosocial history related to current functional performance   ? Occupational performance deficits (Please refer to evaluation for details): ADL's;IADL's;Play;Social Participation;Rest and Sleep   ? Body Structure / Function / Physical Skills ADL;Endurance;UE functional use;Balance;Flexibility;Pain;FMC;ROM;Gait;Coordination;GMC;Sensation;Decreased knowledge of precautions;Decreased knowledge of use of  DME;IADL;Dexterity;Strength;Mobility   ? Rehab Potential Good   ? OT Frequency 2x / week   ? OT Duration 12 weeks   ? OT Treatment/Interventions Self-care/ADL training;Ultrasound;DME and/or AE instruction;Patient/family education;[place]

## 2022-01-05 DIAGNOSIS — N186 End stage renal disease: Secondary | ICD-10-CM | POA: Diagnosis not present

## 2022-01-05 DIAGNOSIS — Z992 Dependence on renal dialysis: Secondary | ICD-10-CM | POA: Diagnosis not present

## 2022-01-05 DIAGNOSIS — N2581 Secondary hyperparathyroidism of renal origin: Secondary | ICD-10-CM | POA: Diagnosis not present

## 2022-01-05 NOTE — Therapy (Signed)
[facility] ?[place] ?[place], [name], [address] ?Phone: [phone]   Fax:  [phone] ? ?Physical Therapy Treatment ? ?Patient Details  ?Name: Carolyn Russo ?MRN: [id number] ?Date of Birth: [birth year] ?Referring Provider (PT): Courtney Heys, MD ? ? ?Encounter Date: 01/04/2022 ? ? PT End of Session - 01/04/22 1453   ? ? Visit Number 16   ? Number of Visits 25   ? Date for PT Re-Evaluation 01/14/22   ? Authorization Type [employer]-auth submitted   ? Authorization Time Period 24 visits from 10/24/21- 01/14/22   ? Authorization - Visit Number 13   ? Authorization - Number of Visits 24   ? Progress Note Due on Visit 20   ? PT Start Time 1454   in bathroom prior to session  ? PT Stop Time 1525   needed bathroom again  ? PT Time Calculation (min) 31 min   ? Equipment Utilized During Treatment Gait belt   ? Activity Tolerance Patient tolerated treatment well   ? Behavior During Therapy [facility] for tasks assessed/performed   ? ?  ?  ? ?  ? ? ?Past Medical History:  ?Diagnosis Date  ? Acute GI bleeding   ? Allergy   ? Anemia   ? Anterior chest wall pain   ? Appendicitis 1965  ? Asthma   ? Body mass index 37.0-37.9, adult   ? Breast pain   ? Cataract   ? both eyes  ? CHF (congestive heart failure) ([place])   ? Chronic kidney disease   ? stage 5 - on dialysis  ? Cognitive change 04/20/2021  ? r/t cva 03/2821  ? Dehydration 2014  ? Deviated septum 1971  ? Diabetes mellitus   ? no meds  ? Dyspnea 2014  ? Extrinsic asthma   ? WITH ASTHMA ATTACK  ? Fibroid 1980  ? GERD (gastroesophageal reflux disease)   ? Heart murmur   ? Hx gestational diabetes   ? Hyperlipidemia   ? Hypertension 2014  ? Inguinal hernia 1959  ? Malaise and fatigue 2014  ? Non-IgE mediated allergic asthma 2014  ? Obesity   ? Pelvic pain   ? Pregnancy, high-risk 1985  ? Stroke [facility]) 04/20/2021  ? (CVA) of right basal ganglia  ? Tonsillitis 1968  ? Uterine fibroid 1980  ? ? ?Past Surgical History:   ?Procedure Laterality Date  ? APPENDECTOMY  1959  ? BASCILIC VEIN TRANSPOSITION Right 04/30/2021  ? Procedure: RIGHT FIRST STAGE [place];  Surgeon: Angelia Mould, MD;  Location: [facility];  Service: Vascular;  Laterality: Right;  ? [place]  ? COLONOSCOPY    ? ESOPHAGOGASTRODUODENOSCOPY (EGD) WITH PROPOFOL N/A 04/18/2021  ? Procedure: ESOPHAGOGASTRODUODENOSCOPY (EGD) WITH PROPOFOL;  Surgeon: Gatha Mayer, MD;  Location: [facility] ENDOSCOPY;  Service: Endoscopy;  Laterality: N/A;  ? EYE SURGERY    ? bilateral cataract   ? FLEXIBLE SIGMOIDOSCOPY N/A 04/18/2021  ? Procedure: FLEXIBLE SIGMOIDOSCOPY;  Surgeon: Gatha Mayer, MD;  Location: [facility] ENDOSCOPY;  Service: Endoscopy;  Laterality: N/A;  ? HERNIA REPAIR  1959  ? IR FLUORO GUIDE CV LINE RIGHT  03/18/2021  ? IR US GUIDE VASC ACCESS RIGHT  03/18/2021  ? LEFT HEART CATH AND CORONARY ANGIOGRAPHY N/A 04/04/2017  ? Procedure: Left Heart Cath and Coronary Angiography;  Surgeon: Belva Crome, MD;  Location: [facility] CV LAB;  Service: Cardiovascular;  Laterality: N/A;  ? [place], 2004, 2007  ?  RHINOPLASTY  1971  ? ROTATOR CUFF REPAIR  2003  ? SURGICAL REPAIR OF HEMORRHAGE  2015  ? TONSILLECTOMY  1968  ? ? ?There were no vitals filed for this visit. ? ? Subjective Assessment - 01/04/22 1454   ? ? Subjective No new complaints. No falls. Usual right arm pain-OT addressing.   ? Patient is accompained by: Family member   CNA Zigmund Daniel  ? Pertinent History ESRD with HD M-W-Fri:, obesity, DM type 2, CAD, iron deficiency anemia, bronchial asthma, chronic diastolic CHF, Rt basal ganglia CVA on 04-16-21, steal syndrome RUE due to dialysis vascular access (s/p fistula placement), CKD stage V:  Rt hand weakness; Lt hemiparesis due to CVA   ? Patient Stated Goals "be independent with mobility and self care"; "be able to drive again"   ? Currently in Pain? Yes   ? Pain Score 5    ? Pain Location Arm   ? Pain Orientation Right   ? Pain Descriptors /  Indicators Aching   ? Pain Type Chronic pain   ? Pain Onset More than a month ago   ? Pain Frequency Constant   ? Aggravating Factors  pressure through hand   ? Pain Relieving Factors rest   ? ?  ?  ? ?  ? ? ? ? ? ? ? ? [facility] Adult PT Treatment/Exercise - 01/04/22 1455   ? ?  ? Transfers  ? Transfers Sit to Stand;Stand to Sit   ? Sit to Stand 5: Supervision;With upper extremity assist;From bed;From chair/3-in-1   ? Stand to Sit 5: Supervision;With upper extremity assist;To bed;To chair/3-in-1   ? Number of Reps 10 reps   plus with session  ? Comments at edge of mat: x 10 reps with cues each time to scoot to edge of mat and for weight shifting with UE assist at times   ?  ? Ambulation/Gait  ? Ambulation/Gait Yes   ? Ambulation/Gait Assistance 5: Supervision   ? Ambulation Distance (Feet) 100 Feet   x1, plus around clinic with session  ? Assistive device Rolling walker   ? Gait Pattern Step-through pattern;Decreased hip/knee flexion - left;Decreased step length - left   ? Ambulation Surface Level;Indoor   ?  ? Knee/Hip Exercises: Aerobic  ? Other Aerobic Scifit UE/LE's level 4 x 5 minutes with goal >/= 80 steps per minute for strengthening and activity tolerance. Goals was 6 minutes, however pt needing to stop due to nausea. This improved with seated rest break.   ?  ? Knee/Hip Exercises: Standing  ? Heel Raises Both;1 set;10 reps;Limitations   ? Heel Raises Limitations no weight today, UE support with cues on posture   ? Hip Flexion AROM;Stengthening;Both;1 set;10 reps;Knee bent;Limitations   ? Hip Flexion Limitations no weights today, alternating marching with UE support.   ? Hip Abduction AROM;Stengthening;Both;1 set;10 reps;Knee straight;Limitations   ? Abduction Limitations no weights, alternating legs with UE support.   ? ?  ?  ? ?  ? ? ? ? ? ? ? ? ? ? ? ? PT Short Term Goals - 12/24/21 1054   ? ?  ? PT SHORT TERM GOAL #1  ? Title Improve TUG score from 32 secs to </= 27 secs with RW with SBA to demo improved  functional mobility. (all STGs due 12-17-21)   ? Baseline 11/18/21: 30.69 sec's with RW, improved from 32 sec's just not to goals;   28.15 secs with RW   ? Status Not Met   ?  Target Date 12/17/21   ?  ? PT SHORT TERM GOAL #2  ? Title Pt will perform 5 times sit to stand transfers from standard chair with only 1 UE support with SBA to demo increased LE strength and balance. Update:  Perform sit to stand without UE support from mat for 3 reps to demo improved LE strength.   ? Baseline 11/18/21: 17.94 sec's with single UE support from standard height surface with retropulstion noted with each rep, min guard assist for safety; met on 12-21-21 with no LOB upon initial standing   ? Status Achieved   ? Target Date 12/17/21   ?  ? PT SHORT TERM GOAL #3  ? Title Increase Berg balance test score from 13/30 to >/= 19/30 to demo improved balance.  Updated STG; increase score to >/= 38/56   ? Baseline 12/16/21: 40/56 scored today   ? Status Achieved   ?  ? PT SHORT TERM GOAL #4  ? Title Increase gait velocity by at least .4 ft/sec with RW to demo improved gait efficiency. Revised updated LTG:  increase gait velocity to >/= 2.0 ft/sec with RW   ? Baseline 11/02/21: 1.17 ft/sec with RW as baselne:    18.4, 18.82 = 1.74 ft/sec.   17.03 secs = 1.92 ft/sec   ? Time 4   ? Period Weeks   ? Status Not Met   ? Target Date 12/17/21   ?  ? PT SHORT TERM GOAL #5  ? Title Amb. 230' with RW with SBA on flat, even surface for increased community accessibility.   ? Baseline 12/16/21: met in session today   ? Status Achieved   ?  ? PT SHORT TERM GOAL #6  ? Title Independent with HEP for strengthening & balance exs.   ? Baseline 11/18/21: met with current program per pt and CNA   ? Status Achieved   ? Target Date 11/19/21   ? ?  ?  ? ?  ? ? ? ? PT Long Term Goals - 12/24/21 1054   ? ?  ? PT LONG TERM GOAL #1  ? Title Improve TUG score to </= 20 secs with RW with supervision to reduce fall risk.   ? Baseline 32 secs with RW   ? Time 12   ? Period Weeks    ? Status New   ? Target Date 01/14/22   ?  ? PT LONG TERM GOAL #2  ? Title Pt will amb. 500' with RW with SBA on flat, even surface for increased community accessibility.   ? Baseline 80' with RW   ? Time 12   ?

## 2022-01-06 ENCOUNTER — Encounter: Payer: Self-pay | Admitting: Physical Therapy

## 2022-01-06 ENCOUNTER — Ambulatory Visit: Payer: Medicare PPO | Admitting: Occupational Therapy

## 2022-01-06 ENCOUNTER — Ambulatory Visit: Payer: Medicare PPO | Admitting: Physical Therapy

## 2022-01-06 ENCOUNTER — Encounter: Payer: Self-pay | Admitting: Occupational Therapy

## 2022-01-06 ENCOUNTER — Other Ambulatory Visit: Payer: Self-pay

## 2022-01-06 DIAGNOSIS — R208 Other disturbances of skin sensation: Secondary | ICD-10-CM | POA: Diagnosis not present

## 2022-01-06 DIAGNOSIS — R2689 Other abnormalities of gait and mobility: Secondary | ICD-10-CM | POA: Diagnosis not present

## 2022-01-06 DIAGNOSIS — I69354 Hemiplegia and hemiparesis following cerebral infarction affecting left non-dominant side: Secondary | ICD-10-CM | POA: Diagnosis not present

## 2022-01-06 DIAGNOSIS — R2681 Unsteadiness on feet: Secondary | ICD-10-CM | POA: Diagnosis not present

## 2022-01-06 DIAGNOSIS — M6281 Muscle weakness (generalized): Secondary | ICD-10-CM | POA: Diagnosis not present

## 2022-01-06 DIAGNOSIS — M79641 Pain in right hand: Secondary | ICD-10-CM | POA: Diagnosis not present

## 2022-01-06 DIAGNOSIS — R278 Other lack of coordination: Secondary | ICD-10-CM | POA: Diagnosis not present

## 2022-01-06 DIAGNOSIS — M25512 Pain in left shoulder: Secondary | ICD-10-CM | POA: Diagnosis not present

## 2022-01-06 NOTE — Therapy (Signed)
[facility] ?[place] ?[place], [name], [address] ?Phone: [phone]   Fax:  [phone] ? ?Occupational Therapy Treatment ? ?Patient Details  ?Name: Carolyn Russo ?MRN: [id number] ?Date of Birth: 18-Jun-1951 ?Referring Provider (OT): Dr. Dagoberto Ligas ? ? ?Encounter Date: 01/06/2022 ? ? OT End of Session - 01/06/22 1144   ? ? Visit Number 15   ? Number of Visits 25   ? Date for OT Re-Evaluation 01/13/22   ? Authorization Type [employer]   ? Authorization Time Period 27 visits 10/24/21 - 02/10/22   ? Authorization - Visit Number 14   ? Progress Note Due on Visit 20   ? OT Start Time 1142   ? OT Stop Time 1227   ? OT Time Calculation (min) 45 min   ? Activity Tolerance Patient tolerated treatment well   ? Behavior During Therapy [facility] for tasks assessed/performed   ? ?  ?  ? ?  ? ? ?Past Medical History:  ?Diagnosis Date  ? Acute GI bleeding   ? Allergy   ? Anemia   ? Anterior chest wall pain   ? Appendicitis 1965  ? Asthma   ? Body mass index 37.0-37.9, adult   ? Breast pain   ? Cataract   ? both eyes  ? CHF (congestive heart failure) ([place])   ? Chronic kidney disease   ? stage 5 - on dialysis  ? Cognitive change 04/20/2021  ? r/t cva 03/2821  ? Dehydration 2014  ? Deviated septum 1971  ? Diabetes mellitus   ? no meds  ? Dyspnea 2014  ? Extrinsic asthma   ? WITH ASTHMA ATTACK  ? Fibroid 1980  ? GERD (gastroesophageal reflux disease)   ? Heart murmur   ? Hx gestational diabetes   ? Hyperlipidemia   ? Hypertension 2014  ? Inguinal hernia 1959  ? Malaise and fatigue 2014  ? Non-IgE mediated allergic asthma 2014  ? Obesity   ? Pelvic pain   ? Pregnancy, high-risk 1985  ? Stroke [facility]) 04/20/2021  ? (CVA) of right basal ganglia  ? Tonsillitis 1968  ? Uterine fibroid 1980  ? ? ?Past Surgical History:  ?Procedure Laterality Date  ? APPENDECTOMY  1959  ? BASCILIC VEIN TRANSPOSITION Right 04/30/2021  ? Procedure: RIGHT FIRST STAGE [place];  Surgeon: Angelia Mould, MD;  Location: [facility];  Service: Vascular;  Laterality: Right;  ? [place]  ? COLONOSCOPY    ? ESOPHAGOGASTRODUODENOSCOPY (EGD) WITH PROPOFOL N/A 04/18/2021  ? Procedure: ESOPHAGOGASTRODUODENOSCOPY (EGD) WITH PROPOFOL;  Surgeon: Gatha Mayer, MD;  Location: [facility] ENDOSCOPY;  Service: Endoscopy;  Laterality: N/A;  ? EYE SURGERY    ? bilateral cataract   ? FLEXIBLE SIGMOIDOSCOPY N/A 04/18/2021  ? Procedure: FLEXIBLE SIGMOIDOSCOPY;  Surgeon: Gatha Mayer, MD;  Location: [facility] ENDOSCOPY;  Service: Endoscopy;  Laterality: N/A;  ? HERNIA REPAIR  1959  ? IR FLUORO GUIDE CV LINE RIGHT  03/18/2021  ? IR US GUIDE VASC ACCESS RIGHT  03/18/2021  ? LEFT HEART CATH AND CORONARY ANGIOGRAPHY N/A 04/04/2017  ? Procedure: Left Heart Cath and Coronary Angiography;  Surgeon: Belva Crome, MD;  Location: [facility] CV LAB;  Service: Cardiovascular;  Laterality: N/A;  ? [place], 2004, 2007  ? RHINOPLASTY  1971  ? ROTATOR CUFF REPAIR  2003  ? SURGICAL REPAIR OF HEMORRHAGE  2015  ? TONSILLECTOMY  1968  ? ? ?There were no vitals  filed for this visit. ? ? Subjective Assessment - 01/06/22 1143   ? ? Subjective  "not falling asleep yet"   ? Pertinent History Carolyn Russo is a 71 y.o. female with PMHx of obesity, DM, HTN, HLD, ESRD on HD who presented with left sided weakness for 3 days on 04/16/2021. Pt was diagnosed with R basal ganglia infarct Pt went to rehab but then transferred back to acute service on 7/13 for likely seizure. Pt returned to CIR and was d/c home on 05/27/21. Pt received HHOT and PT. Pt has R hand weakness s/p fistula placement. PMH: diabetes, hypertension, hyperlipidemia, ESRD on HD MWF.   ? Patient Stated Goals to be like I was prior to my stroke   ? Currently in Pain? Yes   ? Pain Score 5    ? Pain Location Wrist   ? Pain Orientation Right   ? Pain Descriptors / Indicators Aching;Sore   ? Pain Type Chronic pain   ? Pain Onset More than a month ago   ? Pain Frequency Constant   ? ?   ?  ? ?  ? ? ? ?Handwriting with RUE and use of foam grip and pencil grip with more success with pencil grip. Issued for take home. Pt practiced writing name and sentence with increasing legibility however patient had frequent times when drifting off to sleep but was easily aroused. Pt traced shapes and uppercase letters with moderate deviation from lines.  ? ?Flipping Cards with RUE with jumbo cards for coordination and manipulation. Pt with mod difficulty with flipping cards and manipulation of RUE for managing grasp of cards. ? ? ? ? ? ? ? ? ? ? ? ? ? ? ? ? ? ? ? ? ? OT Short Term Goals - 12/23/21 1623   ? ?  ? OT SHORT TERM GOAL #1  ? Title I with initial HEP   ? Time 4   ? Period Weeks   ? Status Achieved   ? Target Date 11/18/21   ?  ? OT SHORT TERM GOAL #2  ? Title Pt will donn shirt  and bathe UB with min A consistently   ? Time 4   ? Period Weeks   ? Status On-going   not consistent  ?  ? OT SHORT TERM GOAL #3  ? Title Pt will be I with bilateral UE positioning including splinting  to minimize pain and risk for contracture and injury.   ? Time 4   ? Period Weeks   ? Status Partially Met   splints have been issued and pt/ caregiver education performed- pt needs reinforcement  ?  ? OT SHORT TERM GOAL #4  ? Title Pt will demonstrate 60* LUE shoulder flexion in prep for functional reach   ? Time 4   ? Period Weeks   ? Status On-going   68*  ?  ? OT SHORT TERM GOAL #5  ? Title Pt will demonstrate improved bilateral UE functional use as evidenced by increasing bilateral box/ blocks score by 3 blocks.   ? Time 4   ? Period Weeks   ? Status Achieved   R 20, L 30  ? ?  ?  ? ?  ? ? ? ? OT Long Term Goals - 10/21/21 1350   ? ?  ? OT LONG TERM GOAL #1  ? Title Pt will perform UB dressing with setup and LB dressing with min A.   ? Time 12   ?  Period Weeks   ? Status New   ?  ? OT LONG TERM GOAL #2  ? Title Pt will bathe herself with min A.   ? Time 12   ? Period Weeks   ? Status New   ?  ? OT LONG TERM GOAL #3  ? Title  Pt will perform simple home management/ cooking tasks with min A.   ? Time 12   ? Period Weeks   ? Status New   ?  ? OT LONG TERM GOAL #4  ? Title Pt will retrieve a lightweight object at 75* shoulder flexion with LUE.   ? Time 12   ? Period Weeks   ? Status New   ?  ? OT LONG TERM GOAL #5  ? Title Pt will increase RUE grip strength to 15 lbs for increased functional use.   ? Time 12   ? Period Weeks   ? Status New   ?  ? OT LONG TERM GOAL #6  ? Title Pt will demonstrate ability to write her name and address with 100% legibility using AE prn.   ? Time 12   ? Period Weeks   ? Status New   ?  ? OT LONG TERM GOAL #7  ? Title Pt will utilize RUE at least 75% of the time for ADLS/ IADLS with pain no greater than 4/10.   ? Time 12   ? Period Weeks   ? Status New   ? ?  ?  ? ?  ? ? ? ? ? ? ? ? Plan - 01/06/22 1325   ? ? Clinical Impression Statement Pt continues to progress towards goals slow and steady. Pt with increased fatigue today.   ? OT Occupational Profile and History Detailed Assessment- Review of Records and additional review of physical, cognitive, psychosocial history related to current functional performance   ? Occupational performance deficits (Please refer to evaluation for details): ADL's;IADL's;Play;Social Participation;Rest and Sleep   ? Body Structure / Function / Physical Skills ADL;Endurance;UE functional use;Balance;Flexibility;Pain;FMC;ROM;Gait;Coordination;GMC;Sensation;Decreased knowledge of precautions;Decreased knowledge of use of DME;IADL;Dexterity;Strength;Mobility   ? Rehab Potential Good   ? OT Frequency 2x / week   ? OT Duration 12 weeks   ? OT Treatment/Interventions Self-care/ADL training;Ultrasound;DME and/or AE instruction;Patient/family education;Balance training;Passive range of motion;Paraffin;Cryotherapy;Fluidtherapy;Splinting;Functional Mobility Training;Electrical Stimulation;Moist Heat;Therapeutic exercise;Manual Therapy;Therapeutic activities;Neuromuscular education   ? Plan LUE  NMR, R hand functional use   ? Consulted and Agree with Plan of Care Patient   husband  ? ?  ?  ? ?  ? ? ?Patient will benefit from skilled therapeutic intervention in order to improve the following defi

## 2022-01-07 ENCOUNTER — Other Ambulatory Visit: Payer: Self-pay | Admitting: Physical Medicine and Rehabilitation

## 2022-01-07 DIAGNOSIS — N2581 Secondary hyperparathyroidism of renal origin: Secondary | ICD-10-CM | POA: Diagnosis not present

## 2022-01-07 DIAGNOSIS — N186 End stage renal disease: Secondary | ICD-10-CM | POA: Diagnosis not present

## 2022-01-07 DIAGNOSIS — Z992 Dependence on renal dialysis: Secondary | ICD-10-CM | POA: Diagnosis not present

## 2022-01-07 NOTE — Therapy (Signed)
[facility] ?[place], [name], [address] ?Phone: [phone]   Fax:  [phone] ? ?Physical Therapy Treatment ? ?Patient Details  ?Name: Carolyn Russo ?MRN: [id number] ?Date of Birth: 1950-11-12 ?Referring Provider (PT): Courtney Heys, MD ? ? ?Encounter Date: 01/06/2022 ? ? PT End of Session - 01/06/22 1106   ? ? Visit Number 17   ? Number of Visits 25   ? Date for PT Re-Evaluation 01/14/22   ? Authorization Type [employer] Medicare-auth submitted   ? Authorization Time Period 24 visits from 10/24/21- 01/14/22   ? Authorization - Visit Number 14   ? Authorization - Number of Visits 24   ? Progress Note Due on Visit 20   ? PT Start Time 1110   in bathroom prior to session  ? PT Stop Time 7673   ? PT Time Calculation (min) 33 min   ? Equipment Utilized During Treatment Gait belt   ? Activity Tolerance Patient tolerated treatment well   ? Behavior During Therapy [facility] for tasks assessed/performed   ? ?  ?  ? ?  ? ? ?Past Medical History:  ?Diagnosis Date  ? Acute GI bleeding   ? Allergy   ? Anemia   ? Anterior chest wall pain   ? Appendicitis 1965  ? Asthma   ? Body mass index 37.0-37.9, adult   ? Breast pain   ? Cataract   ? both eyes  ? CHF (congestive heart failure) ([place])   ? Chronic kidney disease   ? stage 5 - on dialysis  ? Cognitive change 04/20/2021  ? r/t cva 03/2821  ? Dehydration 2014  ? Deviated septum 1971  ? Diabetes mellitus   ? no meds  ? Dyspnea 2014  ? Extrinsic asthma   ? WITH ASTHMA ATTACK  ? Fibroid 1980  ? GERD (gastroesophageal reflux disease)   ? Heart murmur   ? Hx gestational diabetes   ? Hyperlipidemia   ? Hypertension 2014  ? Inguinal hernia 1959  ? Malaise and fatigue 2014  ? Non-IgE mediated allergic asthma 2014  ? Obesity   ? Pelvic pain   ? Pregnancy, high-risk 1985  ? Stroke [facility]) 04/20/2021  ? (CVA) of right basal ganglia  ? Tonsillitis 1968  ? Uterine fibroid 1980  ? ? ?Past Surgical History:  ?Procedure Laterality Date   ? APPENDECTOMY  1959  ? BASCILIC VEIN TRANSPOSITION Right 04/30/2021  ? Procedure: RIGHT FIRST STAGE [place];  Surgeon: Angelia Mould, MD;  Location: [place];  Service: Vascular;  Laterality: Right;  ? [place]  ? COLONOSCOPY    ? ESOPHAGOGASTRODUODENOSCOPY (EGD) WITH PROPOFOL N/A 04/18/2021  ? Procedure: ESOPHAGOGASTRODUODENOSCOPY (EGD) WITH PROPOFOL;  Surgeon: Gatha Mayer, MD;  Location: [facility] ENDOSCOPY;  Service: Endoscopy;  Laterality: N/A;  ? EYE SURGERY    ? bilateral cataract   ? FLEXIBLE SIGMOIDOSCOPY N/A 04/18/2021  ? Procedure: FLEXIBLE SIGMOIDOSCOPY;  Surgeon: Gatha Mayer, MD;  Location: [facility] ENDOSCOPY;  Service: Endoscopy;  Laterality: N/A;  ? HERNIA REPAIR  1959  ? IR FLUORO GUIDE CV LINE RIGHT  03/18/2021  ? IR US GUIDE VASC ACCESS RIGHT  03/18/2021  ? LEFT HEART CATH AND CORONARY ANGIOGRAPHY N/A 04/04/2017  ? Procedure: Left Heart Cath and Coronary Angiography;  Surgeon: Belva Crome, MD;  Location: [place] CV LAB;  Service: Cardiovascular;  Laterality: N/A;  ? [place], 2004, 2007  ? RHINOPLASTY  1971  ?  ROTATOR CUFF REPAIR  2003  ? SURGICAL REPAIR OF HEMORRHAGE  2015  ? TONSILLECTOMY  1968  ? ? ?There were no vitals filed for this visit. ? ? Subjective Assessment - 01/06/22 1105   ? ? Subjective No new complaints. Continues with usual pain, in addition her sciatica is hurting more today on right side   ? Patient is accompained by: Family member   CNA Zigmund Daniel  ? Pertinent History ESRD with HD M-W-Fri:, obesity, DM type 2, CAD, iron deficiency anemia, bronchial asthma, chronic diastolic CHF, Rt basal ganglia CVA on 04-16-21, steal syndrome RUE due to dialysis vascular access (s/p fistula placement), CKD stage V:  Rt hand weakness; Lt hemiparesis due to CVA   ? Patient Stated Goals "be independent with mobility and self care"; "be able to drive again"   ? Currently in Pain? Yes   ? Pain Score 5    ? Pain Location Generalized   right arm and hip  ? Pain  Orientation Right   ? Pain Descriptors / Indicators Aching;Sore   ? Pain Type Chronic pain   ? Pain Onset More than a month ago   ? Pain Frequency Constant   ? Aggravating Factors  pressure through hand, unsure about hip/sciatica   ? Pain Relieving Factors resting   ? ?  ?  ? ?  ? ? ? ? ? ? ? ? [facility] Adult PT Treatment/Exercise - 01/06/22 1112   ? ?  ? Transfers  ? Transfers Sit to Stand;Stand to Sit   ? Sit to Stand 5: Supervision;With upper extremity assist;From bed;From chair/3-in-1   ? Stand to Sit 5: Supervision;With upper extremity assist;To bed;To chair/3-in-1   ?  ? Ambulation/Gait  ? Ambulation/Gait Yes   ? Ambulation/Gait Assistance 5: Supervision   ? Ambulation Distance (Feet) 115 Feet   x2, plus around clinic with session  ? Assistive device Rolling walker   ? Gait Pattern Step-through pattern;Decreased hip/knee flexion - left;Decreased step length - left   ? Ambulation Surface Level;Indoor   ?  ? Neuro Re-ed   ? Neuro Re-ed Details  for balance/muscle re-ed/coordination: standing with UE support on walker- with 6 inch box: alternating forward, then cross foot taps for 10 reps each/each way with min guard assist. then with airex: forward<>backward stepping (stepping onto airex<>back to floor) for 5 reps each foot leading, min guard assist with cues; standing on airex: heel<>toe raises, alternating marching and mini squats for 10 reps each/bil LE's with min guard assist, cues on form/technique.   ?  ? Knee/Hip Exercises: Aerobic  ? Other Aerobic Scifit UE/LE's level 4 x 6 minutes with goal >/= 80 steps per minute for strengthening and activity tolerance.   ? ?  ?  ? ?  ? ? ? ? ? ? ? ? PT Short Term Goals - 12/24/21 1054   ? ?  ? PT SHORT TERM GOAL #1  ? Title Improve TUG score from 32 secs to </= 27 secs with RW with SBA to demo improved functional mobility. (all STGs due 12-17-21)   ? Baseline 11/18/21: 30.69 sec's with RW, improved from 32 sec's just not to goals;   28.15 secs with RW   ? Status Not Met    ? Target Date 12/17/21   ?  ? PT SHORT TERM GOAL #2  ? Title Pt will perform 5 times sit to stand transfers from standard chair with only 1 UE support with SBA to demo increased LE strength and  balance. Update:  Perform sit to stand without UE support from mat for 3 reps to demo improved LE strength.   ? Baseline 11/18/21: 17.94 sec's with single UE support from standard height surface with retropulstion noted with each rep, min guard assist for safety; met on 12-21-21 with no LOB upon initial standing   ? Status Achieved   ? Target Date 12/17/21   ?  ? PT SHORT TERM GOAL #3  ? Title Increase Berg balance test score from 13/30 to >/= 19/30 to demo improved balance.  Updated STG; increase score to >/= 38/56   ? Baseline 12/16/21: 40/56 scored today   ? Status Achieved   ?  ? PT SHORT TERM GOAL #4  ? Title Increase gait velocity by at least .4 ft/sec with RW to demo improved gait efficiency. Revised updated LTG:  increase gait velocity to >/= 2.0 ft/sec with RW   ? Baseline 11/02/21: 1.17 ft/sec with RW as baselne:    18.4, 18.82 = 1.74 ft/sec.   17.03 secs = 1.92 ft/sec   ? Time 4   ? Period Weeks   ? Status Not Met   ? Target Date 12/17/21   ?  ? PT SHORT TERM GOAL #5  ? Title Amb. 230' with RW with SBA on flat, even surface for increased community accessibility.   ? Baseline 12/16/21: met in session today   ? Status Achieved   ?  ? PT SHORT TERM GOAL #6  ? Title Independent with HEP for strengthening & balance exs.   ? Baseline 11/18/21: met with current program per pt and CNA   ? Status Achieved   ? Target Date 11/19/21   ? ?  ?  ? ?  ? ? ? ? PT Long Term Goals - 12/24/21 1054   ? ?  ? PT LONG TERM GOAL #1  ? Title Improve TUG score to </= 20 secs with RW with supervision to reduce fall risk.   ? Baseline 32 secs with RW   ? Time 12   ? Period Weeks   ? Status New   ? Target Date 01/14/22   ?  ? PT LONG TERM GOAL #2  ? Title Pt will amb. 500' with RW with SBA on flat, even surface for increased community accessibility.    ? Baseline 27' with RW   ? Time 12   ? Period Weeks   ? Status New   ? Target Date 01/14/22   ?  ? PT LONG TERM GOAL #3  ? Title Increase Berg balance test score from 13/56 to >/= 28/56 to reduce fall r

## 2022-01-10 DIAGNOSIS — N2581 Secondary hyperparathyroidism of renal origin: Secondary | ICD-10-CM | POA: Diagnosis not present

## 2022-01-10 DIAGNOSIS — Z992 Dependence on renal dialysis: Secondary | ICD-10-CM | POA: Diagnosis not present

## 2022-01-10 DIAGNOSIS — N186 End stage renal disease: Secondary | ICD-10-CM | POA: Diagnosis not present

## 2022-01-11 ENCOUNTER — Ambulatory Visit: Payer: Medicare PPO | Admitting: Occupational Therapy

## 2022-01-11 ENCOUNTER — Other Ambulatory Visit: Payer: Self-pay

## 2022-01-11 ENCOUNTER — Encounter: Payer: Self-pay | Admitting: Physical Therapy

## 2022-01-11 ENCOUNTER — Ambulatory Visit: Payer: Medicare PPO | Admitting: Physical Therapy

## 2022-01-11 VITALS — BP 187/86

## 2022-01-11 DIAGNOSIS — M79641 Pain in right hand: Secondary | ICD-10-CM

## 2022-01-11 DIAGNOSIS — R2681 Unsteadiness on feet: Secondary | ICD-10-CM | POA: Diagnosis not present

## 2022-01-11 DIAGNOSIS — M6281 Muscle weakness (generalized): Secondary | ICD-10-CM

## 2022-01-11 DIAGNOSIS — R278 Other lack of coordination: Secondary | ICD-10-CM

## 2022-01-11 DIAGNOSIS — R208 Other disturbances of skin sensation: Secondary | ICD-10-CM | POA: Diagnosis not present

## 2022-01-11 DIAGNOSIS — M25512 Pain in left shoulder: Secondary | ICD-10-CM

## 2022-01-11 DIAGNOSIS — I69354 Hemiplegia and hemiparesis following cerebral infarction affecting left non-dominant side: Secondary | ICD-10-CM

## 2022-01-11 DIAGNOSIS — R2689 Other abnormalities of gait and mobility: Secondary | ICD-10-CM

## 2022-01-11 NOTE — Therapy (Signed)
[facility] ?[place], [name], [address] ?Phone: [phone]   Fax:  [phone] ? ?Physical Therapy Treatment ? ?Patient Details  ?Name: Carolyn Russo ?MRN: [id number] ?Date of Birth: April 24, 1951 ?Referring Provider (PT): Courtney Heys, MD ? ? ?Encounter Date: 01/11/2022 ? ? PT End of Session - 01/11/22 2110   ? ? Visit Number 18   ? Number of Visits 28   ? Date for PT Re-Evaluation 02/18/22   ? Authorization Type [employer] Medicare-auth submitted   ? Authorization Time Period 24 visits from 10/24/21- 01/14/22   ? Authorization - Visit Number 18   ? Authorization - Number of Visits 24   ? Progress Note Due on Visit 20   ? PT Start Time 1402   ? PT Stop Time 9381   ? PT Time Calculation (min) 43 min   ? Equipment Utilized During Treatment Gait belt   ? Activity Tolerance Patient tolerated treatment well   ? Behavior During Therapy [facility] for tasks assessed/performed   ? ?  ?  ? ?  ? ? ?Past Medical History:  ?Diagnosis Date  ? Acute GI bleeding   ? Allergy   ? Anemia   ? Anterior chest wall pain   ? Appendicitis 1965  ? Asthma   ? Body mass index 37.0-37.9, adult   ? Breast pain   ? Cataract   ? both eyes  ? CHF (congestive heart failure) ([place])   ? Chronic kidney disease   ? stage 5 - on dialysis  ? Cognitive change 04/20/2021  ? r/t cva 03/2821  ? Dehydration 2014  ? Deviated septum 1971  ? Diabetes mellitus   ? no meds  ? Dyspnea 2014  ? Extrinsic asthma   ? WITH ASTHMA ATTACK  ? Fibroid 1980  ? GERD (gastroesophageal reflux disease)   ? Heart murmur   ? Hx gestational diabetes   ? Hyperlipidemia   ? Hypertension 2014  ? Inguinal hernia 1959  ? Malaise and fatigue 2014  ? Non-IgE mediated allergic asthma 2014  ? Obesity   ? Pelvic pain   ? Pregnancy, high-risk 1985  ? Stroke [facility]) 04/20/2021  ? (CVA) of right basal ganglia  ? Tonsillitis 1968  ? Uterine fibroid 1980  ? ? ?Past Surgical History:  ?Procedure Laterality Date  ? APPENDECTOMY  1959  ?  BASCILIC VEIN TRANSPOSITION Right 04/30/2021  ? Procedure: RIGHT FIRST STAGE [place];  Surgeon: Angelia Mould, MD;  Location: [place];  Service: Vascular;  Laterality: Right;  ? [place]  ? COLONOSCOPY    ? ESOPHAGOGASTRODUODENOSCOPY (EGD) WITH PROPOFOL N/A 04/18/2021  ? Procedure: ESOPHAGOGASTRODUODENOSCOPY (EGD) WITH PROPOFOL;  Surgeon: Gatha Mayer, MD;  Location: [facility] ENDOSCOPY;  Service: Endoscopy;  Laterality: N/A;  ? EYE SURGERY    ? bilateral cataract   ? FLEXIBLE SIGMOIDOSCOPY N/A 04/18/2021  ? Procedure: FLEXIBLE SIGMOIDOSCOPY;  Surgeon: Gatha Mayer, MD;  Location: [facility] ENDOSCOPY;  Service: Endoscopy;  Laterality: N/A;  ? HERNIA REPAIR  1959  ? IR FLUORO GUIDE CV LINE RIGHT  03/18/2021  ? IR US GUIDE VASC ACCESS RIGHT  03/18/2021  ? LEFT HEART CATH AND CORONARY ANGIOGRAPHY N/A 04/04/2017  ? Procedure: Left Heart Cath and Coronary Angiography;  Surgeon: Belva Crome, MD;  Location: [place] CV LAB;  Service: Cardiovascular;  Laterality: N/A;  ? [place], 2004, 2007  ? RHINOPLASTY  1971  ? ROTATOR CUFF REPAIR  2003  ? SURGICAL REPAIR OF HEMORRHAGE  2015  ? TONSILLECTOMY  1968  ? ? ?There were no vitals filed for this visit. ? ? ? ? ? ? [facility] PT Assessment - 01/11/22 0001   ? ?  ? Berg Balance Test  ? Sit to Stand Able to stand without using hands and stabilize independently   ? Standing Unsupported Able to stand 2 minutes with supervision   ? Sitting with Back Unsupported but Feet Supported on Floor or Stool Able to sit safely and securely 2 minutes   ? Stand to Sit Sits safely with minimal use of hands   ? Transfers Able to transfer safely, definite need of hands   ? Standing Unsupported with Eyes Closed Able to stand 10 seconds with supervision   ? Standing Unsupported with Feet Together Able to place feet together independently and stand for 1 minute with supervision   ? From Standing, Reach Forward with Outstretched Arm Can reach forward >12 cm safely (5")    8 inches  ? From Standing Position, Pick up Object from Floor Unable to try/needs assist to keep balance   ? From Standing Position, Turn to Look Behind Over each Shoulder Looks behind one side only/other side shows less weight shift   ? Turn 360 Degrees Able to turn 360 degrees safely but slowly   >6 seconds  ? Standing Unsupported, Alternately Place Feet on Step/Stool Able to complete >2 steps/needs minimal assist   ? Standing Unsupported, One Foot in Front Able to take small step independently and hold 30 seconds   ? Standing on One Leg Tries to lift leg/unable to hold 3 seconds but remains standing independently   ? Total Score 36   ? Berg comment: 40/56 37-45 significant risk   ? ?  ?  ? ?  ? ? ? ? ? ? ? ? ? ? ? ? ? ? ? ? [place] Adult PT Treatment/Exercise - 01/11/22 0001   ? ?  ? Transfers  ? Transfers Sit to Stand;Stand to Sit   ? Sit to Stand 5: Supervision   ? Sit to Stand Details (indicate cue type and reason) 5 reps from mat without UE support   ? Stand to Sit 5: Supervision   ? Comments attempted sit to stand from chair - pt unable to perform sit to stand from chair without UE support   ?  ? Ambulation/Gait  ? Ambulation/Gait Yes   ? Ambulation/Gait Assistance 5: Supervision   ? Ambulation Distance (Feet) 230 Feet   ? Assistive device Rolling walker   ? Gait Pattern Step-through pattern;Decreased hip/knee flexion - left;Decreased step length - left   ? Ambulation Surface Level;Indoor   ? Gait velocity 17.34 secs = 1.89 ft/sec with RW   ?  ? Standardized Balance Assessment  ? Standardized Balance Assessment Timed Up and Go Test   ?  ? Timed Up and Go Test  ? Normal TUG (seconds) 29.56   with RW  ? ?  ?  ? ?  ? ? ? ? ? ? ? ? ? ? ? ? PT Short Term Goals - 01/11/22 2106   ? ?  ? PT SHORT TERM GOAL #1  ? Title Improve TUG score from 32 secs to </= 27 secs with RW with SBA to demo improved functional mobility. (all STGs due 12-17-21)   ? Baseline 11/18/21: 30.69 sec's with RW, improved from 32 sec's just not to  goals;   28.15 secs  with RW   ? Status Not Met   ? Target Date 12/17/21   ?  ? PT SHORT TERM GOAL #2  ? Title Pt will perform 5 times sit to stand transfers from standard chair with only 1 UE support with SBA to demo increased LE strength and balance. Update:  Perform sit to stand without UE support from mat for 3 reps to demo improved LE strength.   ? Baseline 11/18/21: 17.94 sec's with single UE support from standard height surface with retropulstion noted with each rep, min guard assist for safety; met on 12-21-21 with no LOB upon initial standing   ? Status Achieved   ? Target Date 12/17/21   ?  ? PT SHORT TERM GOAL #3  ? Title Increase Berg balance test score from 13/30 to >/= 19/30 to demo improved balance.  Updated STG; increase score to >/= 38/56   ? Baseline 12/16/21: 40/56 scored today   ? Status Achieved   ?  ? PT SHORT TERM GOAL #4  ? Title Increase gait velocity by at least .4 ft/sec with RW to demo improved gait efficiency. Revised updated LTG:  increase gait velocity to >/= 2.0 ft/sec with RW   ? Baseline 11/02/21: 1.17 ft/sec with RW as baselne:    18.4, 18.82 = 1.74 ft/sec.   17.03 secs = 1.92 ft/sec   ? Time 4   ? Period Weeks   ? Status Not Met   ? Target Date 12/17/21   ?  ? PT SHORT TERM GOAL #5  ? Title Amb. 230' with RW with SBA on flat, even surface for increased community accessibility.   ? Baseline 12/16/21: met in session today   ? Status Achieved   ?  ? PT SHORT TERM GOAL #6  ? Title Independent with HEP for strengthening & balance exs.   ? Baseline 11/18/21: met with current program per pt and CNA   ? Status Achieved   ? Target Date 11/19/21   ? ?  ?  ? ?  ? ? ? ? PT Long Term Goals - 01/11/22 1404   ? ?  ? PT LONG TERM GOAL #1  ? Title Improve TUG score to </= 20 secs with RW with supervision to reduce fall risk.   ? Baseline 32 secs with RW;  29.56 secs with RW on 01-11-22   ? Time 5   ? Period Weeks   ? Status On-going   ? Target Date 02/18/22   ?  ? PT LONG TERM GOAL #2  ? Title Pt will  amb. 500' with RW with SBA on flat, even surface for increased community accessibility.   ? Baseline 58' with RW;   230' with RW on 01-11-22   ? Time 5   ? Period Weeks   ? Status On-going   ? Target Date 04/2

## 2022-01-12 DIAGNOSIS — N186 End stage renal disease: Secondary | ICD-10-CM | POA: Diagnosis not present

## 2022-01-12 DIAGNOSIS — N2581 Secondary hyperparathyroidism of renal origin: Secondary | ICD-10-CM | POA: Diagnosis not present

## 2022-01-12 DIAGNOSIS — Z992 Dependence on renal dialysis: Secondary | ICD-10-CM | POA: Diagnosis not present

## 2022-01-12 NOTE — Therapy (Signed)
[facility] [address] [place], [place], [address] Phone: [phone]   Fax:  [phone]  Occupational Therapy Treatment  Patient Details  Name: Carolyn Russo MRN: [id number] Date of Birth: June 18, 1951 Referring Provider (OT): Dr. Berline Chough   Encounter Date: 01/11/2022   OT End of Session - 01/12/22 0729     Visit Number 16    Number of Visits 32    Date for OT Re-Evaluation 03/09/22    Authorization Type [employer]    Authorization Time Period 27 visits 10/24/21 - 02/10/22    Authorization - Visit Number 16    Progress Note Due on Visit 20    OT Start Time 1325   pt in BR   OT Stop Time 1400    OT Time Calculation (min) 35 min    Activity Tolerance Patient tolerated treatment well;Patient limited by fatigue    Behavior During Therapy [facility] for tasks assessed/performed             Past Medical History:  Diagnosis Date   Acute GI bleeding    Allergy    Anemia    Anterior chest wall pain    Appendicitis 1965   Asthma    Body mass index 37.0-37.9, adult    Breast pain    Cataract    both eyes   CHF (congestive heart failure) (HCC)    Chronic kidney disease    stage 5 - on dialysis   Cognitive change 04/20/2021   r/t cva 03/2821   Dehydration 2014   Deviated septum 1971   Diabetes mellitus    no meds   Dyspnea 2014   Extrinsic asthma    WITH ASTHMA ATTACK   Fibroid 1980   GERD (gastroesophageal reflux disease)    Heart murmur    Hx gestational diabetes    Hyperlipidemia    Hypertension 2014   Inguinal hernia 1959   Malaise and fatigue 2014   Non-IgE mediated allergic asthma 2014   Obesity    Pelvic pain    Pregnancy, high-risk 1985   Stroke (HCC) 04/20/2021   (CVA) of right basal ganglia   Tonsillitis 1968   Uterine fibroid 1980    Past Surgical History:  Procedure Laterality Date   APPENDECTOMY  1959   BASCILIC VEIN TRANSPOSITION Right 04/30/2021   Procedure: RIGHT FIRST STAGE BASCILIC VEIN  TRANSPOSITION;  Surgeon: Chuck Hint, MD;  Location: [facility] OR;  Service: Vascular;  Laterality: Right;   CESAREAN SECTION  1985   COLONOSCOPY     ESOPHAGOGASTRODUODENOSCOPY (EGD) WITH PROPOFOL N/A 04/18/2021   Procedure: ESOPHAGOGASTRODUODENOSCOPY (EGD) WITH PROPOFOL;  Surgeon: Iva Boop, MD;  Location: [facility] ENDOSCOPY;  Service: Endoscopy;  Laterality: N/A;   EYE SURGERY     bilateral cataract    FLEXIBLE SIGMOIDOSCOPY N/A 04/18/2021   Procedure: FLEXIBLE SIGMOIDOSCOPY;  Surgeon: Iva Boop, MD;  Location: [facility] ENDOSCOPY;  Service: Endoscopy;  Laterality: N/A;   HERNIA REPAIR  1959   IR FLUORO GUIDE CV LINE RIGHT  03/18/2021   IR US GUIDE VASC ACCESS RIGHT  03/18/2021   LEFT HEART CATH AND CORONARY ANGIOGRAPHY N/A 04/04/2017   Procedure: Left Heart Cath and Coronary Angiography;  Surgeon: Lyn Records, MD;  Location: [facility] INVASIVE CV LAB;  Service: Cardiovascular;  Laterality: N/A;   MYOMECTOMY  1980, 2004, 2007   RHINOPLASTY  1971   ROTATOR CUFF REPAIR  2003   SURGICAL REPAIR OF HEMORRHAGE  2015   TONSILLECTOMY  1968  Vitals:   01/11/22 1353 01/11/22 1354  BP: (!) 190/92 (!) 187/86     Subjective Assessment - 01/12/22 0728     Subjective  "I had to go to a funeral this weekend'    Pertinent History Carolyn Russo is a 71 y.o. female with PMHx of obesity, DM, HTN, HLD, ESRD on HD who presented with left sided weakness for 3 days on 04/16/2021. Pt was diagnosed with R basal ganglia infarct Pt went to rehab but then transferred back to acute service on 7/13 for likely seizure. Pt returned to CIR and was d/c home on 05/27/21. Pt received HHOT and PT. Pt has R hand weakness s/p fistula placement. PMH: diabetes, hypertension, hyperlipidemia, ESRD on HD MWF.    Patient Stated Goals to be like I was prior to my stroke    Pain Score 5     Pain Location Wrist    Pain Orientation Right    Pain Descriptors / Indicators Aching    Pain Type Chronic pain    Pain Onset More than a  month ago    Pain Frequency Constant    Aggravating Factors  pressure    Pain Relieving Factors rest                    Treatment: Table slides for gentle shoulder flexion Pt's BP was monitored and found to be elevated. Therapist checked progress towards goals and discussed with pt while seated at table. Water provided. Therapist discussed plans to continue therapy for the remainder of the scheduled visits however anticipate d/c at end of scheduled visits. Pt was encouraged to use R hand more and to bring in anti claw splint. Pt's BP was improved by end of session.                 OT Short Term Goals - 01/11/22 1329       OT SHORT TERM GOAL #1   Title I with initial HEP    Time 4    Period Weeks    Status Achieved    Target Date 11/18/21      OT SHORT TERM GOAL #2   Title Pt will donn shirt  and bathe UB with min A consistently    Time 4    Period Weeks    Status Achieved   met pt donns shirt without assistance 3 days per week, and she reports assisting with bathing     OT SHORT TERM GOAL #3   Title Pt will be I with bilateral UE positioning including splinting  to minimize pain and risk for contracture and injury.    Time 4    Period Weeks    Status Achieved   met, pt verbalizes understanding     OT SHORT TERM GOAL #4   Title Pt will demonstrate 60* LUE shoulder flexion in prep for functional reach    Time 4    Period Weeks    Status Achieved   60*     OT SHORT TERM GOAL #5   Title Pt will demonstrate improved bilateral UE functional use as evidenced by increasing bilateral box/ blocks score by 3 blocks.    Time 4    Period Weeks    Status Achieved   R 20, L 30     OT SHORT TERM GOAL #6   Title Pt will demonstrate improved bilateral UE functional use as evidenced by increasing bilateral box/ blocks score to 23 blocks for  RUE and 33 for LUE    Baseline R 20, L 30 at renewal    Time 4    Period Weeks    Status New      OT SHORT TERM GOAL  #7   Title Pt will demonstrate 60* LUE shoulder flexion in prep for functional reach    Baseline 60* at renewal    Time 4    Period Weeks    Status New      OT SHORT TERM GOAL #8   Title Pt will consistently donn pants with no more than min A    Time 4    Period Weeks    Status New      OT SHORT TERM GOAL  #9   TITLE Pt/ caregiver will verbalize understanding of adpted strategies to increase pt's safety and I with ADLs/ IADLs.    Time 4    Period Weeks    Status New               OT Long Term Goals - 01/11/22 1346       OT LONG TERM GOAL #1   Title Pt will perform UB dressing with setup and LB dressing with min A.    Time --    Period Weeks    Status Partially Met   pt donns shirt following set up  3 days per week, not consistent for LB min-mod A     OT LONG TERM GOAL #2   Title Pt will bathe herself with min A.    Time --    Period Weeks    Status Achieved   Pt reports bathing with min A     OT LONG TERM GOAL #3   Title Pt will perform simple home management/ cooking tasks with min A.    Time 8    Period Weeks    Status On-going   folding laundry     OT LONG TERM GOAL #4   Title Pt will retrieve a lightweight object at 75* shoulder flexion with LUE Revised goal-.Pt will retrieve a lightweight object at 70* shoulder flexion with LUE.    Time 8    Period Weeks    Status Revised   01/11/22-60*     OT LONG TERM GOAL #5   Title Pt will increase RUE grip strength to 15 lbs for increased functional use.    Time 8    Period Weeks    Status On-going   9.9 lbs     OT LONG TERM GOAL #6   Title Pt will demonstrate ability to write her name and address with 100% legibility using AE prn.    Time 8    Period Weeks    Status On-going   not consistent-01/11/22     OT LONG TERM GOAL #7   Title Pt will utilize RUE at least 75% of the time for ADLS/ IADLS with pain no greater than 4/10.    Time 8    Period Weeks    Status On-going   uses RUE grossly 55% of the time                   Plan - 01/12/22 0740     Clinical Impression Statement Pt demonstrates progress towards goals, however she has not fully achieved goals due to severity of deficits. Pt can benefit from continued skilled occupational therapy to address the following deficits: decreased strength, decreased coordination, decreased balance, decreased UE functional use,  pain cognitve deficits, visual perceptual deficits in order to maximize  pt's safety and I with ADLs/ IADLS.    OT Occupational Profile and History Detailed Assessment- Review of Records and additional review of physical, cognitive, psychosocial history related to current functional performance    Occupational performance deficits (Please refer to evaluation for details): ADL's;IADL's;Play;Social Participation;Rest and Sleep    Body Structure / Function / Physical Skills ADL;Endurance;UE functional use;Balance;Flexibility;Pain;FMC;ROM;Gait;Coordination;GMC;Sensation;Decreased knowledge of precautions;Decreased knowledge of use of DME;IADL;Dexterity;Strength;Mobility    Rehab Potential Good    Clinical Decision Making Limited treatment options, no task modification necessary    Comorbidities Affecting Occupational Performance: May have comorbidities impacting occupational performance    Modification or Assistance to Complete Evaluation  Min-Moderate modification of tasks or assist with assess necessary to complete eval    OT Frequency 2x / week   renewal written for 8 weeks to account for scheduling, anticipate d/c after 6   OT Duration 8 weeks    OT Treatment/Interventions Self-care/ADL training;Ultrasound;DME and/or AE instruction;Patient/family education;Balance training;Passive range of motion;Paraffin;Cryotherapy;Fluidtherapy;Splinting;Functional Mobility Training;Electrical Stimulation;Moist Heat;Therapeutic exercise;Manual Therapy;Therapeutic activities;Neuromuscular education    Plan supine closed chain shoulder flexion,  reviwe HEP, ADL strategies for pants and shirt             Patient will benefit from skilled therapeutic intervention in order to improve the following deficits and impairments:   Body Structure / Function / Physical Skills: ADL, Endurance, UE functional use, Balance, Flexibility, Pain, FMC, ROM, Gait, Coordination, GMC, Sensation, Decreased knowledge of precautions, Decreased knowledge of use of DME, IADL, Dexterity, Strength, Mobility       Visit Diagnosis: Hemiplegia and hemiparesis following cerebral infarction affecting left non-dominant side (HCC) - Plan: Ot plan of care cert/re-cert  Pain in right hand - Plan: Ot plan of care cert/re-cert  Other lack of coordination - Plan: Ot plan of care cert/re-cert  Other disturbances of skin sensation - Plan: Ot plan of care cert/re-cert  Other abnormalities of gait and mobility - Plan: Ot plan of care cert/re-cert  Acute pain of left shoulder - Plan: Ot plan of care cert/re-cert  Unsteadiness on feet - Plan: Ot plan of care cert/re-cert  Muscle weakness (generalized) - Plan: Ot plan of care cert/re-cert    Problem List Patient Active Problem List   Diagnosis Date Noted   Left hemiparesis (HCC) 09/24/2021   Abnormality of gait 09/24/2021   Pain of right hand 08/06/2021   Steal syndrome of dialysis vascular access (HCC) 08/06/2021   Subclavian steal syndrome of right subclavian artery 06/25/2021   Right hand weakness 06/25/2021   Stroke-like symptoms 05/06/2021   Bandemia    Cerebrovascular accident (CVA) of right basal ganglia (HCC) 04/20/2021   External hemorrhoids    Basal ganglia infarction (HCC) 04/16/2021   ESRD on dialysis (HCC) 04/16/2021   Hypertensive urgency 03/10/2021   Hilar enlargement    Anemia of chronic disease    Chronic diastolic CHF (congestive heart failure) (HCC) 03/09/2021   CKD (chronic kidney disease), stage V (HCC) 03/08/2021   Diabetic retinopathy (HCC) 03/08/2021   Hyperglycemia due to type  2 diabetes mellitus (HCC) 03/08/2021   Pure hypercholesterolemia 03/08/2021   Anemia in chronic kidney disease 09/24/2020   Uncontrolled type 2 diabetes mellitus with hyperglycemia (HCC) 03/19/2019   Osteopenia 09/04/2018   Migraine 09/04/2018   Asthma 09/04/2018   Pseudophakia of both eyes 07/17/2018   Pseudophakia of left eye 07/03/2018   Open angle with borderline findings and high glaucoma risk in left eye 07/03/2018  Age-related nuclear cataract of right eye 07/03/2018   CAD (coronary artery disease), native coronary artery 04/27/2017   Abnormal nuclear stress test 04/04/2017   Dyspnea on exertion 03/09/2017   Appendicitis    Age-related hypermature cataract of both eyes 12/20/2016   Uterine leiomyoma 11/27/2012   Dyslipidemia (high LDL; low HDL) 11/25/2011   Obesity (BMI 30-39.9) 11/25/2011   Hypertensive disorder 11/21/2011   Type 2 diabetes mellitus (HCC) 11/21/2011   Abnormal EKG 11/21/2011    Carolyn Russo, OT 01/12/2022, 7:51 AM Carolyn Russo, OTR/L [phone] Phone: [phone] 7:51 AM 01/12/22  [facility] [address] [place], [place], [address] Phone: [phone]   Fax:  [phone]  Name: Carolyn Russo MRN: [id number] Date of Birth: Apr 05, 1951

## 2022-01-13 ENCOUNTER — Ambulatory Visit: Payer: Medicare PPO | Admitting: Physical Therapy

## 2022-01-13 ENCOUNTER — Ambulatory Visit: Payer: Medicare PPO | Admitting: Occupational Therapy

## 2022-01-13 DIAGNOSIS — E7800 Pure hypercholesterolemia, unspecified: Secondary | ICD-10-CM | POA: Diagnosis not present

## 2022-01-13 DIAGNOSIS — E1165 Type 2 diabetes mellitus with hyperglycemia: Secondary | ICD-10-CM | POA: Diagnosis not present

## 2022-01-13 DIAGNOSIS — N186 End stage renal disease: Secondary | ICD-10-CM | POA: Diagnosis not present

## 2022-01-13 DIAGNOSIS — Z992 Dependence on renal dialysis: Secondary | ICD-10-CM | POA: Diagnosis not present

## 2022-01-13 DIAGNOSIS — I10 Essential (primary) hypertension: Secondary | ICD-10-CM | POA: Diagnosis not present

## 2022-01-13 DIAGNOSIS — E11319 Type 2 diabetes mellitus with unspecified diabetic retinopathy without macular edema: Secondary | ICD-10-CM | POA: Diagnosis not present

## 2022-01-14 DIAGNOSIS — N2581 Secondary hyperparathyroidism of renal origin: Secondary | ICD-10-CM | POA: Diagnosis not present

## 2022-01-14 DIAGNOSIS — Z992 Dependence on renal dialysis: Secondary | ICD-10-CM | POA: Diagnosis not present

## 2022-01-14 DIAGNOSIS — N186 End stage renal disease: Secondary | ICD-10-CM | POA: Diagnosis not present

## 2022-01-17 ENCOUNTER — Other Ambulatory Visit: Payer: Self-pay

## 2022-01-17 ENCOUNTER — Ambulatory Visit: Payer: Medicare PPO | Admitting: Podiatry

## 2022-01-17 DIAGNOSIS — L03031 Cellulitis of right toe: Secondary | ICD-10-CM

## 2022-01-17 DIAGNOSIS — Z992 Dependence on renal dialysis: Secondary | ICD-10-CM | POA: Diagnosis not present

## 2022-01-17 DIAGNOSIS — L97511 Non-pressure chronic ulcer of other part of right foot limited to breakdown of skin: Secondary | ICD-10-CM | POA: Diagnosis not present

## 2022-01-17 DIAGNOSIS — I739 Peripheral vascular disease, unspecified: Secondary | ICD-10-CM | POA: Diagnosis not present

## 2022-01-17 DIAGNOSIS — N2581 Secondary hyperparathyroidism of renal origin: Secondary | ICD-10-CM | POA: Diagnosis not present

## 2022-01-17 DIAGNOSIS — N186 End stage renal disease: Secondary | ICD-10-CM | POA: Diagnosis not present

## 2022-01-17 MED ORDER — CEPHALEXIN 500 MG PO CAPS: 500.0000 mg | ORAL_CAPSULE | ORAL | 0 refills | Status: AC

## 2022-01-17 MED ORDER — MUPIROCIN 2 % EX OINT: 1.0000 "application " | TOPICAL_OINTMENT | Freq: Two times a day (BID) | CUTANEOUS | 2 refills | Status: DC

## 2022-01-17 NOTE — Progress Notes (Signed)
?  Subjective:  ?Patient ID: Carolyn Russo, female    DOB: 14-Jun-1951,  MRN: [id number] ? ?Chief Complaint  ?Patient presents with  ? Toe Pain  ?   diabetic with right toe swollen  ? ? ?71 y.o. female presents with the above complaint. History confirmed with patient.  She presents swelling of her right great toe there is a small blister they noticed after traveling and wearing a different type of shoe ? ?Objective:  ?Physical Exam: ?Foot is warm and the ankles to cool at the toes, nonpalpable pedal pulses, there is dark discoloration of the hallux with a small abrasion at the distal tip, no exposed bone or subcutaneous tissue, seems to be limited to breakdown of skin, no cellulitis or purulence.  Appears dysvascular ?Assessment:  ? ?1. Skin ulcer of right great toe, limited to breakdown of skin ([place])   ?2. Cellulitis of great toe, right   ?3. Peripheral arterial disease ([place])   ? ? ? ?Plan:  ?Patient was evaluated and treated and all questions answered. ? ?Discussed with her and her husband that she has a small wound of the distal hallux is likely secondary to mechanical pressure.She has type 2 diabetes as well as peripheral neuropathy from this I think her sensation at the tip of the toe was impaired.  I prescribed her Keflex as a precaution to take after dialysis as well as mupirocin ointment to apply daily for local wound care.  Her foot and toe appear dysvascular and I recommend we evaluate her with noninvasive vascular testing and ABI with ultrasound was ordered.  I will see her back in 2 weeks for reevaluation.  We discussed signs and symptoms of worsening infection and possible outcomes if this does not heal including infection osteomyelitis and risk of amputation. ? ?Return in about 2 weeks (around 01/31/2022) for follow up on blood flow testing and wound check .  ? ?

## 2022-01-18 ENCOUNTER — Ambulatory Visit: Payer: Medicare PPO | Admitting: Physical Therapy

## 2022-01-18 ENCOUNTER — Ambulatory Visit: Payer: Medicare PPO | Admitting: Occupational Therapy

## 2022-01-18 VITALS — BP 186/96

## 2022-01-18 DIAGNOSIS — R278 Other lack of coordination: Secondary | ICD-10-CM | POA: Diagnosis not present

## 2022-01-18 DIAGNOSIS — R2681 Unsteadiness on feet: Secondary | ICD-10-CM | POA: Diagnosis not present

## 2022-01-18 DIAGNOSIS — M6281 Muscle weakness (generalized): Secondary | ICD-10-CM

## 2022-01-18 DIAGNOSIS — I69354 Hemiplegia and hemiparesis following cerebral infarction affecting left non-dominant side: Secondary | ICD-10-CM

## 2022-01-18 DIAGNOSIS — R2689 Other abnormalities of gait and mobility: Secondary | ICD-10-CM

## 2022-01-18 DIAGNOSIS — M79641 Pain in right hand: Secondary | ICD-10-CM | POA: Diagnosis not present

## 2022-01-18 DIAGNOSIS — R208 Other disturbances of skin sensation: Secondary | ICD-10-CM

## 2022-01-18 DIAGNOSIS — M25512 Pain in left shoulder: Secondary | ICD-10-CM | POA: Diagnosis not present

## 2022-01-18 NOTE — Patient Instructions (Signed)
Wear your anti claw splint, flip playing cards with your right hand, 10-20 reps ? ? ?Composite Flexion (Active) ? ? ? ? ?Opposition (Active) ? ? ?Right hand -Touch tip of thumb to nail tip of each finger in turn, making an "O" shape. ?Repeat __10__ times. Do 2-3__ sessions per day. ? ? ?MP Flexion (Active) ? ? ?Right Bend thumb to touch base of little finger, keeping tip joint straight. ?Repeat __10-15__ times. Do _2-3__ sessions per day. ? ? ? ? ? ? ? ? ? ? ? ? ?Putty-in-Your-Hand ? ? ? ?With left hand slowly squeeze putty or a soft rubber ball while breathing normally. Repeat with other hand. ?Repeat __20__ times. Do ___1_ sessions per day. ? ?http://[email or web address].exer.us/885  ? ?Pinch: Lateral ? ? ? ?Squeeze putty between right thumb and side of each finger in turn. Make small balls and squash into coin shapes. ?Repeat __10-20__ times. Do _1___ sessions per day. ?Activity: Hold dish of food.* Turn key in lock. Deal cards. ? ?Copyright ? VHI. All rights reserved.  ? ? ?Copyright ? VHI. All rights reserved.  ? ?

## 2022-01-18 NOTE — Therapy (Signed)
[place] [facility] ?[place], [name], [address] ?Phone: [phone]   Fax:  [phone] ? ?Occupational Therapy Treatment ? ?Patient Details  ?Name: Carolyn Russo ?MRN: [id number] ?Date of Birth: 01-Nov-1950 ?Referring Provider (OT): Dr. Dagoberto Ligas ? ? ?Encounter Date: 01/18/2022 ? ? OT End of Session - 01/18/22 1506   ? ? Visit Number 17   ? Number of Visits 32   ? Date for OT Re-Evaluation 03/09/22   ? Authorization Type [employer]   ? Authorization Time Period 27 visits 10/24/21 - 02/10/22   ? Authorization - Visit Number 17   ? Progress Note Due on Visit 20   ? OT Start Time 1406   ? OT Stop Time 1450   ? OT Time Calculation (min) 44 min   ? Activity Tolerance Patient tolerated treatment well;Patient limited by fatigue   ? Behavior During Therapy [place] for tasks assessed/performed   ? ?  ?  ? ?  ? ? ?Past Medical History:  ?Diagnosis Date  ? Acute GI bleeding   ? Allergy   ? Anemia   ? Anterior chest wall pain   ? Appendicitis 1965  ? Asthma   ? Body mass index 37.0-37.9, adult   ? Breast pain   ? Cataract   ? both eyes  ? CHF (congestive heart failure) ([place])   ? Chronic kidney disease   ? stage 5 - on dialysis  ? Cognitive change 04/20/2021  ? r/t cva 03/2821  ? Dehydration 2014  ? Deviated septum 1971  ? Diabetes mellitus   ? no meds  ? Dyspnea 2014  ? Extrinsic asthma   ? WITH ASTHMA ATTACK  ? Fibroid 1980  ? GERD (gastroesophageal reflux disease)   ? Heart murmur   ? Hx gestational diabetes   ? Hyperlipidemia   ? Hypertension 2014  ? Inguinal hernia 1959  ? Malaise and fatigue 2014  ? Non-IgE mediated allergic asthma 2014  ? Obesity   ? Pelvic pain   ? Pregnancy, high-risk 1985  ? Stroke [facility]) 04/20/2021  ? (CVA) of right basal ganglia  ? Tonsillitis 1968  ? Uterine fibroid 1980  ? ? ?Past Surgical History:  ?Procedure Laterality Date  ? APPENDECTOMY  1959  ? BASCILIC VEIN TRANSPOSITION Right 04/30/2021  ? Procedure: RIGHT FIRST STAGE [place];  Surgeon: Angelia Mould, MD;  Location: [facility];  Service: Vascular;  Laterality: Right;  ? [place]  ? COLONOSCOPY    ? ESOPHAGOGASTRODUODENOSCOPY (EGD) WITH PROPOFOL N/A 04/18/2021  ? Procedure: ESOPHAGOGASTRODUODENOSCOPY (EGD) WITH PROPOFOL;  Surgeon: Gatha Mayer, MD;  Location: [facility] ENDOSCOPY;  Service: Endoscopy;  Laterality: N/A;  ? EYE SURGERY    ? bilateral cataract   ? FLEXIBLE SIGMOIDOSCOPY N/A 04/18/2021  ? Procedure: FLEXIBLE SIGMOIDOSCOPY;  Surgeon: Gatha Mayer, MD;  Location: [facility] ENDOSCOPY;  Service: Endoscopy;  Laterality: N/A;  ? HERNIA REPAIR  1959  ? IR FLUORO GUIDE CV LINE RIGHT  03/18/2021  ? IR US GUIDE VASC ACCESS RIGHT  03/18/2021  ? LEFT HEART CATH AND CORONARY ANGIOGRAPHY N/A 04/04/2017  ? Procedure: Left Heart Cath and Coronary Angiography;  Surgeon: Belva Crome, MD;  Location: [place] CV LAB;  Service: Cardiovascular;  Laterality: N/A;  ? [place], 2004, 2007  ? RHINOPLASTY  1971  ? ROTATOR CUFF REPAIR  2003  ? SURGICAL REPAIR OF HEMORRHAGE  2015  ? TONSILLECTOMY  1968  ? ? ?Vitals:  ?  01/18/22 1412  ?BP: (!) 186/96  ? ? ? Subjective Assessment - 01/18/22 1420   ? ? Pertinent History [name] is a 71 y.o. female with PMHx of obesity, DM, HTN, HLD, ESRD on HD who presented with left sided weakness for 3 days on 04/16/2021. Pt was diagnosed with R basal ganglia infarct Pt went to rehab but then transferred back to acute service on 7/13 for likely seizure. Pt returned to CIR and was d/c home on 05/27/21. Pt received HHOT and PT. Pt has R hand weakness s/p fistula placement. PMH: diabetes, hypertension, hyperlipidemia, ESRD on HD MWF.   ? Patient Stated Goals to be like I was prior to my stroke   ? Currently in Pain? Yes   ? Pain Score 6    ? Pain Location Wrist   ? Pain Orientation Right   ? Pain Descriptors / Indicators Aching   ? Pain Type Chronic pain   ? Pain Onset More than a month ago   ? Pain Frequency Constant   ? ?  ?  ? ?   ? ? ? ? ? ? ? ?Treatment: tracing various sized circles with RUE while wearing anti-claw splint  with coban wrapped highlighter, mod difficulty and min facilitation/ v.c ?Pt practiced writing an printing her name with improving accuracy, pt was able to print a short sentence legibly with min v.c and coban wrapped splint using anti claw splint. ? ? ? ? ? ? ? ? ? ? ? ? ? ? ? OT Education - 01/18/22 1505   ? ? Education Details Updated HEP issued including yellow putty for right lateral pinch and left composite grip- see pt instructions   ? Person(s) Educated Associate Professor)   ? Methods Explanation;Demonstration;Verbal cues;Handout   ? Comprehension Verbalized understanding;Returned demonstration;Verbal cues required   ? ?  ?  ? ?  ? ? ? OT Short Term Goals - 01/11/22 1329   ? ?  ? OT SHORT TERM GOAL #1  ? Title I with initial HEP   ? Time 4   ? Period Weeks   ? Status Achieved   ? Target Date 11/18/21   ?  ? OT SHORT TERM GOAL #2  ? Title Pt will donn shirt  and bathe UB with min A consistently   ? Time 4   ? Period Weeks   ? Status Achieved   met pt donns shirt without assistance 3 days per week, and she reports assisting with bathing  ?  ? OT SHORT TERM GOAL #3  ? Title Pt will be I with bilateral UE positioning including splinting  to minimize pain and risk for contracture and injury.   ? Time 4   ? Period Weeks   ? Status Achieved   met, pt verbalizes understanding  ?  ? OT SHORT TERM GOAL #4  ? Title Pt will demonstrate 60* LUE shoulder flexion in prep for functional reach   ? Time 4   ? Period Weeks   ? Status Achieved   60*  ?  ? OT SHORT TERM GOAL #5  ? Title Pt will demonstrate improved bilateral UE functional use as evidenced by increasing bilateral box/ blocks score by 3 blocks.   ? Time 4   ? Period Weeks   ? Status Achieved   R 20, L 30  ?  ? OT SHORT TERM GOAL #6  ? Title Pt will demonstrate improved bilateral UE functional use as evidenced by increasing  bilateral box/ blocks score to 23 blocks for  RUE and 33 for LUE   ? Baseline R 20, L 30 at renewal   ? Time 4   ? Period Weeks   ? Status New   ?  ? OT SHORT TERM GOAL #7  ? Title Pt will demonstrate 60* LUE shoulder flexion in prep for functional reach   ? Baseline 60* at renewal   ? Time 4   ? Period Weeks   ? Status New   ?  ? OT SHORT TERM GOAL #8  ? Title Pt will consistently donn pants with no more than min A   ? Time 4   ? Period Weeks   ? Status New   ?  ? OT SHORT TERM GOAL  #9  ? TITLE Pt/ caregiver will verbalize understanding of adpted strategies to increase pt's safety and I with ADLs/ IADLs.   ? Time 4   ? Period Weeks   ? Status New   ? ?  ?  ? ?  ? ? ? ? OT Long Term Goals - 01/11/22 1346   ? ?  ? OT LONG TERM GOAL #1  ? Title Pt will perform UB dressing with setup and LB dressing with min A.   ? Time --   ? Period Weeks   ? Status Partially Met   pt donns shirt following set up  3 days per week, not consistent for LB min-mod A  ?  ? OT LONG TERM GOAL #2  ? Title Pt will bathe herself with min A.   ? Time --   ? Period Weeks   ? Status Achieved   Pt reports bathing with min A  ?  ? OT LONG TERM GOAL #3  ? Title Pt will perform simple home management/ cooking tasks with min A.   ? Time 8   ? Period Weeks   ? Status On-going   folding laundry  ?  ? OT LONG TERM GOAL #4  ? Title Pt will retrieve a lightweight object at 75* shoulder flexion with LUE Revised goal-.Pt will retrieve a lightweight object at 70* shoulder flexion with LUE.   ? Time 8   ? Period Weeks   ? Status Revised   01/11/22-60*  ?  ? OT LONG TERM GOAL #5  ? Title Pt will increase RUE grip strength to 15 lbs for increased functional use.   ? Time 8   ? Period Weeks   ? Status On-going   9.9 lbs  ?  ? OT LONG TERM GOAL #6  ? Title Pt will demonstrate ability to write her name and address with 100% legibility using AE prn.   ? Time 8   ? Period Weeks   ? Status On-going   not consistent-01/11/22  ?  ? OT LONG TERM GOAL #7  ? Title Pt will utilize RUE at least 75% of the time for ADLS/  IADLS with pain no greater than 4/10.   ? Time 8   ? Period Weeks   ? Status On-going   uses RUE grossly 55% of the time  ? ?  ?  ? ?  ? ? ? ? ? ? ? ? Plan - 01/18/22 1507   ? ? Clinical Impression Statement Pt is

## 2022-01-18 NOTE — Therapy (Signed)
[place] [facility] ?[place], [name], [address] ?Phone: [phone]   Fax:  [phone] ? ?Physical Therapy Treatment and 19th Visit Progress Note ? ? ?Progress Note ?Reporting Period 12-09-21 to 01-18-22 ? ?See note below for Objective Data and Assessment of Progress/Goals.  ? ?  ?Patient Details  ?Name: Carolyn Russo ?MRN: [id number] ?Date of Birth: 1950-11-04 ?Referring Provider (PT): Courtney Heys, MD ? ? ?Encounter Date: 01/18/2022 ? ? PT End of Session - 01/18/22 2047   ? ? Visit Number 19   ? Number of Visits 28   ? Date for PT Re-Evaluation 02/18/22   ? Authorization Type [employer]-auth submitted   ? Authorization Time Period 24 visits from 10/24/21- 01/14/22; renewal for [employer] requested for dates 01-13-22 - 02-18-22 for 10 additional visits   ? Authorization - Visit Number 18   ? Authorization - Number of Visits 24   ? Progress Note Due on Visit 29   ? PT Start Time 1320   ? PT Stop Time 1400   ? PT Time Calculation (min) 40 min   ? Equipment Utilized During Treatment Gait belt   ? Activity Tolerance Patient tolerated treatment well   ? Behavior During Therapy [facility] for tasks assessed/performed   ? ?  ?  ? ?  ? ? ?Past Medical History:  ?Diagnosis Date  ? Acute GI bleeding   ? Allergy   ? Anemia   ? Anterior chest wall pain   ? Appendicitis 1965  ? Asthma   ? Body mass index 37.0-37.9, adult   ? Breast pain   ? Cataract   ? both eyes  ? CHF (congestive heart failure) ([place])   ? Chronic kidney disease   ? stage 5 - on dialysis  ? Cognitive change 04/20/2021  ? r/t cva 03/2821  ? Dehydration 2014  ? Deviated septum 1971  ? Diabetes mellitus   ? no meds  ? Dyspnea 2014  ? Extrinsic asthma   ? WITH ASTHMA ATTACK  ? Fibroid 1980  ? GERD (gastroesophageal reflux disease)   ? Heart murmur   ? Hx gestational diabetes   ? Hyperlipidemia   ? Hypertension 2014  ? Inguinal hernia 1959  ? Malaise and fatigue 2014  ? Non-IgE mediated allergic asthma 2014  ?  Obesity   ? Pelvic pain   ? Pregnancy, high-risk 1985  ? Stroke [facility]) 04/20/2021  ? (CVA) of right basal ganglia  ? Tonsillitis 1968  ? Uterine fibroid 1980  ? ? ?Past Surgical History:  ?Procedure Laterality Date  ? APPENDECTOMY  1959  ? BASCILIC VEIN TRANSPOSITION Right 04/30/2021  ? Procedure: RIGHT FIRST STAGE [place];  Surgeon: Angelia Mould, MD;  Location: [place];  Service: Vascular;  Laterality: Right;  ? [place]  ? COLONOSCOPY    ? ESOPHAGOGASTRODUODENOSCOPY (EGD) WITH PROPOFOL N/A 04/18/2021  ? Procedure: ESOPHAGOGASTRODUODENOSCOPY (EGD) WITH PROPOFOL;  Surgeon: Gatha Mayer, MD;  Location: [facility] ENDOSCOPY;  Service: Endoscopy;  Laterality: N/A;  ? EYE SURGERY    ? bilateral cataract   ? FLEXIBLE SIGMOIDOSCOPY N/A 04/18/2021  ? Procedure: FLEXIBLE SIGMOIDOSCOPY;  Surgeon: Gatha Mayer, MD;  Location: [facility] ENDOSCOPY;  Service: Endoscopy;  Laterality: N/A;  ? HERNIA REPAIR  1959  ? IR FLUORO GUIDE CV LINE RIGHT  03/18/2021  ? IR US GUIDE VASC ACCESS RIGHT  03/18/2021  ? LEFT HEART CATH AND CORONARY ANGIOGRAPHY N/A 04/04/2017  ? Procedure: Left  Heart Cath and Coronary Angiography;  Surgeon: Belva Crome, MD;  Location: [place] CV LAB;  Service: Cardiovascular;  Laterality: N/A;  ? [place], 2004, 2007  ? RHINOPLASTY  1971  ? ROTATOR CUFF REPAIR  2003  ? SURGICAL REPAIR OF HEMORRHAGE  2015  ? TONSILLECTOMY  1968  ? ? ?There were no vitals filed for this visit. ? ? Subjective Assessment - 01/18/22 1330   ? ? Subjective Pt reports her Rt foot and Rt big toe are really sore - states Rt foot is swollen due to being in dependent position for 6 hours in the car as they went out of town last weekend for a funeral; went to MD yesterday because she thought she may have had gout - was diagnosed with skin ulcer on Rt big toe; pt states she wore different shoes which were tighter on her foot which contributed to the problem   ? Patient is accompained by: Family member    CNA Zigmund Daniel  ? Pertinent History ESRD with HD M-W-Fri:, obesity, DM type 2, CAD, iron deficiency anemia, bronchial asthma, chronic diastolic CHF, Rt basal ganglia CVA on 04-16-21, steal syndrome RUE due to dialysis vascular access (s/p fistula placement), CKD stage V:  Rt hand weakness; Lt hemiparesis due to CVA   ? Patient Stated Goals "be independent with mobility and self care"; "be able to drive again"   ? Currently in Pain? Yes   ? Pain Score 6    ? Pain Location Toe (Comment which one)   ? Pain Orientation Right   ? Pain Descriptors / Indicators Aching;Sore   ? Pain Type Acute pain   ? Pain Onset In the past 7 days   ? Pain Frequency Constant   ? Aggravating Factors  weight bearing or tight shoes increase pain   ? Pain Relieving Factors nonweight bearing   ? ?  ?  ? ?  ? ? ? ? ? ? ? ? ? ? ? ? ? ? ? ? ? ? ? ? [place] Adult PT Treatment/Exercise - 01/18/22 1336   ? ?  ? Transfers  ? Transfers Sit to Stand;Stand to Sit   ? Sit to Stand 5: Supervision   ? Stand to Sit 5: Supervision   ? Number of Reps Other reps (comment)   5 reps from mat table - RW in front of pt  ? Comments no UE support used with sit to stand transfer   ?  ? Ambulation/Gait  ? Ambulation/Gait Yes   ? Ambulation/Gait Assistance 4: Min guard   ? Ambulation Distance (Feet) 115 Feet   60' x 2 reps - from mat to SciFit and then from SciFit back to mat table  ? Assistive device Rolling walker   ? Gait Pattern Step-through pattern;Decreased hip/knee flexion - left;Decreased step length - left   ? Ambulation Surface Level;Indoor   ? Gait Comments Pt performed ladder activity on floor using step over step sequence with use of RW 2 reps to facilitate increased step length bil. LE's   ?  ? Neuro Re-ed   ? Neuro Re-ed Details  Alternate tap ups to 6" step 10 reps each LE with RUE support on hand rail; Pt performed SLS activity - touching 2 balance bubbles with each foot straight ahead 5 reps each and then diagonal taps with RUE support on counter;   ?  ? Knee/Hip  Exercises: Aerobic  ? Recumbent Bike Scifit level 3.0 with UE's and LE's  x 4"   ? ?  ?  ? ?  ? ?Neuro Re-ed: pt stood at counter - performed alternating forward kicks 3 reps on each leg with RUE support on counter - 3 reps only  ?due to c/o sciatica in Rt hip - ?Requested seated rest period after 3 reps on each leg ? ? ? ? ? ? ? ? ? ? PT Short Term Goals - 01/18/22 2049   ? ?  ? PT SHORT TERM GOAL #1  ? Title Improve TUG score from 32 secs to </= 27 secs with RW with SBA to demo improved functional mobility. (all STGs due 12-17-21)   ? Baseline 11/18/21: 30.69 sec's with RW, improved from 32 sec's just not to goals;   28.15 secs with RW   ? Status Not Met   ? Target Date 12/17/21   ?  ? PT SHORT TERM GOAL #2  ? Title Pt will perform 5 times sit to stand transfers from standard chair with only 1 UE support with SBA to demo increased LE strength and balance. Update:  Perform sit to stand without UE support from mat for 3 reps to demo improved LE strength.   ? Baseline 11/18/21: 17.94 sec's with single UE support from standard height surface with retropulstion noted with each rep, min guard assist for safety; met on 12-21-21 with no LOB upon initial standing   ? Status Achieved   ? Target Date 12/17/21   ?  ? PT SHORT TERM GOAL #3  ? Title Increase Berg balance test score from 13/30 to >/= 19/30 to demo improved balance.  Updated STG; increase score to >/= 38/56   ? Baseline 12/16/21: 40/56 scored today   ? Status Achieved   ?  ? PT SHORT TERM GOAL #4  ? Title Increase gait velocity by at least .4 ft/sec with RW to demo improved gait efficiency. Revised updated LTG:  increase gait velocity to >/= 2.0 ft/sec with RW   ? Baseline 11/02/21: 1.17 ft/sec with RW as baselne:    18.4, 18.82 = 1.74 ft/sec.   17.03 secs = 1.92 ft/sec   ? Time 4   ? Period Weeks   ? Status Not Met   ? Target Date 12/17/21   ?  ? PT SHORT TERM GOAL #5  ? Title Amb. 230' with RW with SBA on flat, even surface for increased community accessibility.   ?  Baseline 12/16/21: met in session today   ? Status Achieved   ?  ? PT SHORT TERM GOAL #6  ? Title Independent with HEP for strengthening & balance exs.   ? Baseline 11/18/21: met with current program per pt

## 2022-01-19 ENCOUNTER — Encounter: Payer: Self-pay | Admitting: Physical Therapy

## 2022-01-19 ENCOUNTER — Encounter: Payer: Self-pay | Admitting: Occupational Therapy

## 2022-01-19 ENCOUNTER — Ambulatory Visit: Payer: Medicare PPO | Admitting: Physical Therapy

## 2022-01-19 ENCOUNTER — Ambulatory Visit: Payer: Medicare PPO | Admitting: Occupational Therapy

## 2022-01-19 VITALS — BP 152/85 | HR 85

## 2022-01-19 DIAGNOSIS — R278 Other lack of coordination: Secondary | ICD-10-CM | POA: Diagnosis not present

## 2022-01-19 DIAGNOSIS — N186 End stage renal disease: Secondary | ICD-10-CM | POA: Diagnosis not present

## 2022-01-19 DIAGNOSIS — M79641 Pain in right hand: Secondary | ICD-10-CM | POA: Diagnosis not present

## 2022-01-19 DIAGNOSIS — N2581 Secondary hyperparathyroidism of renal origin: Secondary | ICD-10-CM | POA: Diagnosis not present

## 2022-01-19 DIAGNOSIS — R2689 Other abnormalities of gait and mobility: Secondary | ICD-10-CM | POA: Diagnosis not present

## 2022-01-19 DIAGNOSIS — M6281 Muscle weakness (generalized): Secondary | ICD-10-CM | POA: Diagnosis not present

## 2022-01-19 DIAGNOSIS — R208 Other disturbances of skin sensation: Secondary | ICD-10-CM

## 2022-01-19 DIAGNOSIS — R2681 Unsteadiness on feet: Secondary | ICD-10-CM | POA: Diagnosis not present

## 2022-01-19 DIAGNOSIS — M25512 Pain in left shoulder: Secondary | ICD-10-CM | POA: Diagnosis not present

## 2022-01-19 DIAGNOSIS — I69354 Hemiplegia and hemiparesis following cerebral infarction affecting left non-dominant side: Secondary | ICD-10-CM

## 2022-01-19 DIAGNOSIS — Z992 Dependence on renal dialysis: Secondary | ICD-10-CM | POA: Diagnosis not present

## 2022-01-19 NOTE — Therapy (Signed)
[place] ?[place] ?[place], [name], [address] ?Phone: [phone]   Fax:  [phone] ? ?Occupational Therapy Treatment ? ?Patient Details  ?Name: Carolyn Russo ?MRN: [id number] ?Date of Birth: [birth year] ?Referring Provider (OT): Dr. Dagoberto Ligas ? ? ?Encounter Date: 01/19/2022 ? ? OT End of Session - 01/19/22 1314   ? ? Visit Number 18   ? Number of Visits 32   ? Date for OT Re-Evaluation 03/09/22   ? Authorization Type [employer]   ? Authorization Time Period 27 visits 10/24/21 - 02/10/22   ? Authorization - Visit Number 18   ? Authorization - Number of Visits 27   ? Progress Note Due on Visit 20   ? OT Start Time 1313   ? OT Stop Time 1355   ? OT Time Calculation (min) 42 min   ? Activity Tolerance Patient tolerated treatment well;Patient limited by fatigue   ? Behavior During Therapy [facility] for tasks assessed/performed   ? ?  ?  ? ?  ? ? ?Past Medical History:  ?Diagnosis Date  ? Acute GI bleeding   ? Allergy   ? Anemia   ? Anterior chest wall pain   ? Appendicitis 1965  ? Asthma   ? Body mass index 37.0-37.9, adult   ? Breast pain   ? Cataract   ? both eyes  ? CHF (congestive heart failure) ([place])   ? Chronic kidney disease   ? stage 5 - on dialysis  ? Cognitive change 04/20/2021  ? r/t cva 03/2821  ? Dehydration 2014  ? Deviated septum 1971  ? Diabetes mellitus   ? no meds  ? Dyspnea 2014  ? Extrinsic asthma   ? WITH ASTHMA ATTACK  ? Fibroid 1980  ? GERD (gastroesophageal reflux disease)   ? Heart murmur   ? Hx gestational diabetes   ? Hyperlipidemia   ? Hypertension 2014  ? Inguinal hernia 1959  ? Malaise and fatigue 2014  ? Non-IgE mediated allergic asthma 2014  ? Obesity   ? Pelvic pain   ? Pregnancy, high-risk 1985  ? Stroke [facility]) 04/20/2021  ? (CVA) of right basal ganglia  ? Tonsillitis 1968  ? Uterine fibroid 1980  ? ? ?Past Surgical History:  ?Procedure Laterality Date  ? APPENDECTOMY  1959  ? BASCILIC VEIN TRANSPOSITION Right 04/30/2021  ? Procedure:  RIGHT FIRST STAGE [place];  Surgeon: Angelia Mould, MD;  Location: [place];  Service: Vascular;  Laterality: Right;  ? [place]  ? COLONOSCOPY    ? ESOPHAGOGASTRODUODENOSCOPY (EGD) WITH PROPOFOL N/A 04/18/2021  ? Procedure: ESOPHAGOGASTRODUODENOSCOPY (EGD) WITH PROPOFOL;  Surgeon: Gatha Mayer, MD;  Location: [facility] ENDOSCOPY;  Service: Endoscopy;  Laterality: N/A;  ? EYE SURGERY    ? bilateral cataract   ? FLEXIBLE SIGMOIDOSCOPY N/A 04/18/2021  ? Procedure: FLEXIBLE SIGMOIDOSCOPY;  Surgeon: Gatha Mayer, MD;  Location: [facility] ENDOSCOPY;  Service: Endoscopy;  Laterality: N/A;  ? HERNIA REPAIR  1959  ? IR FLUORO GUIDE CV LINE RIGHT  03/18/2021  ? IR US GUIDE VASC ACCESS RIGHT  03/18/2021  ? LEFT HEART CATH AND CORONARY ANGIOGRAPHY N/A 04/04/2017  ? Procedure: Left Heart Cath and Coronary Angiography;  Surgeon: Belva Crome, MD;  Location: [place] CV LAB;  Service: Cardiovascular;  Laterality: N/A;  ? [place], 2004, 2007  ? RHINOPLASTY  1971  ? ROTATOR CUFF REPAIR  2003  ? SURGICAL REPAIR OF HEMORRHAGE  2015  ?  TONSILLECTOMY  1968  ? ? ?There were no vitals filed for this visit. ? ? Subjective Assessment - 01/19/22 1315   ? ? Subjective  "i'm tired - i didn't bring my putty today"   ? Patient is accompanied by: --   caregiver, Suzie  ? Pertinent History Carolyn Russo is a 71 y.o. female with PMHx of obesity, DM, HTN, HLD, ESRD on HD who presented with left sided weakness for 3 days on 04/16/2021. Pt was diagnosed with R basal ganglia infarct Pt went to rehab but then transferred back to acute service on 7/13 for likely seizure. Pt returned to CIR and was d/c home on 05/27/21. Pt received HHOT and PT. Pt has R hand weakness s/p fistula placement. PMH: diabetes, hypertension, hyperlipidemia, ESRD on HD MWF.   ? Patient Stated Goals to be like I was prior to my stroke   ? Currently in Pain? Yes   ? Pain Score 5    ? Pain Location Wrist   ? Pain Orientation Right   ? Pain  Descriptors / Indicators Aching;Sore   ? Pain Type Chronic pain   ? Pain Onset More than a month ago   ? Pain Frequency Constant   ? ?  ?  ? ?  ? ? ?Supine Shoulder Exerises reviewed HEP with min cues for slowing down and good attending to LUE.  ? ?Dowel Exercises while seated at edge of mat with reaching down towards floor and back up x10 reps ? ?Medium Peg Board with LUE with one at a time for placing pegs into board. Pt removed with one at a time.  Pt req'd min cues for shoulder flexion and reach at low level and completed with min difficulty and drops. Pt attempted to place pegs with RUE with max difficulty and req'd assistance for placing pegs into hand and increased time.  ? ? ? ? ? ? ? ? ? ? ? ? ? ? ? ? ? ? ? ? ? ? ? OT Short Term Goals - 01/11/22 1329   ? ?  ? OT SHORT TERM GOAL #1  ? Title I with initial HEP   ? Time 4   ? Period Weeks   ? Status Achieved   ? Target Date 11/18/21   ?  ? OT SHORT TERM GOAL #2  ? Title Pt will donn shirt  and bathe UB with min A consistently   ? Time 4   ? Period Weeks   ? Status Achieved   met pt donns shirt without assistance 3 days per week, and she reports assisting with bathing  ?  ? OT SHORT TERM GOAL #3  ? Title Pt will be I with bilateral UE positioning including splinting  to minimize pain and risk for contracture and injury.   ? Time 4   ? Period Weeks   ? Status Achieved   met, pt verbalizes understanding  ?  ? OT SHORT TERM GOAL #4  ? Title Pt will demonstrate 60* LUE shoulder flexion in prep for functional reach   ? Time 4   ? Period Weeks   ? Status Achieved   60*  ?  ? OT SHORT TERM GOAL #5  ? Title Pt will demonstrate improved bilateral UE functional use as evidenced by increasing bilateral box/ blocks score by 3 blocks.   ? Time 4   ? Period Weeks   ? Status Achieved   R 20, L 30  ?  ?  OT SHORT TERM GOAL #6  ? Title Pt will demonstrate improved bilateral UE functional use as evidenced by increasing bilateral box/ blocks score to 23 blocks for RUE and 33 for  LUE   ? Baseline R 20, L 30 at renewal   ? Time 4   ? Period Weeks   ? Status New   ?  ? OT SHORT TERM GOAL #7  ? Title Pt will demonstrate 60* LUE shoulder flexion in prep for functional reach   ? Baseline 60* at renewal   ? Time 4   ? Period Weeks   ? Status New   ?  ? OT SHORT TERM GOAL #8  ? Title Pt will consistently donn pants with no more than min A   ? Time 4   ? Period Weeks   ? Status New   ?  ? OT SHORT TERM GOAL  #9  ? TITLE Pt/ caregiver will verbalize understanding of adpted strategies to increase pt's safety and I with ADLs/ IADLs.   ? Time 4   ? Period Weeks   ? Status New   ? ?  ?  ? ?  ? ? ? ? OT Long Term Goals - 01/11/22 1346   ? ?  ? OT LONG TERM GOAL #1  ? Title Pt will perform UB dressing with setup and LB dressing with min A.   ? Time --   ? Period Weeks   ? Status Partially Met   pt donns shirt following set up  3 days per week, not consistent for LB min-mod A  ?  ? OT LONG TERM GOAL #2  ? Title Pt will bathe herself with min A.   ? Time --   ? Period Weeks   ? Status Achieved   Pt reports bathing with min A  ?  ? OT LONG TERM GOAL #3  ? Title Pt will perform simple home management/ cooking tasks with min A.   ? Time 8   ? Period Weeks   ? Status On-going   folding laundry  ?  ? OT LONG TERM GOAL #4  ? Title Pt will retrieve a lightweight object at 75* shoulder flexion with LUE Revised goal-.Pt will retrieve a lightweight object at 70* shoulder flexion with LUE.   ? Time 8   ? Period Weeks   ? Status Revised   01/11/22-60*  ?  ? OT LONG TERM GOAL #5  ? Title Pt will increase RUE grip strength to 15 lbs for increased functional use.   ? Time 8   ? Period Weeks   ? Status On-going   9.9 lbs  ?  ? OT LONG TERM GOAL #6  ? Title Pt will demonstrate ability to write her name and address with 100% legibility using AE prn.   ? Time 8   ? Period Weeks   ? Status On-going   not consistent-01/11/22  ?  ? OT LONG TERM GOAL #7  ? Title Pt will utilize RUE at least 75% of the time for ADLS/ IADLS with pain  no greater than 4/10.   ? Time 8   ? Period Weeks   ? Status On-going   uses RUE grossly 55% of the time  ? ?  ?  ? ?  ? ? ? ? ? ? ? ? Plan - 01/19/22 1315   ? ? Clinical Impression Statement Pt req'd m

## 2022-01-20 NOTE — Therapy (Signed)
[facility] [facility], [name], [address] ?Phone: [phone]   Fax:  [phone] ? ?Physical Therapy Treatment ? ?Patient Details  ?Name: Carolyn Russo ?MRN: [id number] ?Date of Birth: 01/04/1951 ?Referring Provider (PT): Courtney Heys, MD ? ? ?Encounter Date: 01/19/2022 ? ? PT End of Session - 01/19/22 1306   ? ? Visit Number 20   ? Number of Visits 28   ? Date for PT Re-Evaluation 02/18/22   ? Authorization Type [employer] Medicare-auth submitted   ? Authorization Time Period 24 visits from 10/24/21- 01/14/22; renewal for [employer] requested for dates 01-13-22 - 02-18-22 for 10 additional visits   ? Authorization - Visit Number 18   ? Authorization - Number of Visits 24   ? Progress Note Due on Visit 29   ? PT Start Time 1245   pt running late due to dialysis prior to PT session  ? PT Stop Time 1314   ? PT Time Calculation (min) 29 min   ? Equipment Utilized During Treatment Gait belt   ? Activity Tolerance Patient tolerated treatment well;Patient limited by fatigue   ? Behavior During Therapy [facility] for tasks assessed/performed   ? ?  ?  ? ?  ? ? ?Past Medical History:  ?Diagnosis Date  ? Acute GI bleeding   ? Allergy   ? Anemia   ? Anterior chest wall pain   ? Appendicitis 1965  ? Asthma   ? Body mass index 37.0-37.9, adult   ? Breast pain   ? Cataract   ? both eyes  ? CHF (congestive heart failure) ([place])   ? Chronic kidney disease   ? stage 5 - on dialysis  ? Cognitive change 04/20/2021  ? r/t cva 03/2821  ? Dehydration 2014  ? Deviated septum 1971  ? Diabetes mellitus   ? no meds  ? Dyspnea 2014  ? Extrinsic asthma   ? WITH ASTHMA ATTACK  ? Fibroid 1980  ? GERD (gastroesophageal reflux disease)   ? Heart murmur   ? Hx gestational diabetes   ? Hyperlipidemia   ? Hypertension 2014  ? Inguinal hernia 1959  ? Malaise and fatigue 2014  ? Non-IgE mediated allergic asthma 2014  ? Obesity   ? Pelvic pain   ? Pregnancy, high-risk 1985  ? Stroke [facility]) 04/20/2021   ? (CVA) of right basal ganglia  ? Tonsillitis 1968  ? Uterine fibroid 1980  ? ? ?Past Surgical History:  ?Procedure Laterality Date  ? APPENDECTOMY  1959  ? BASCILIC VEIN TRANSPOSITION Right 04/30/2021  ? Procedure: RIGHT FIRST STAGE [place];  Surgeon: Angelia Mould, MD;  Location: [place];  Service: Vascular;  Laterality: Right;  ? [place]  ? COLONOSCOPY    ? ESOPHAGOGASTRODUODENOSCOPY (EGD) WITH PROPOFOL N/A 04/18/2021  ? Procedure: ESOPHAGOGASTRODUODENOSCOPY (EGD) WITH PROPOFOL;  Surgeon: Gatha Mayer, MD;  Location: [facility] ENDOSCOPY;  Service: Endoscopy;  Laterality: N/A;  ? EYE SURGERY    ? bilateral cataract   ? FLEXIBLE SIGMOIDOSCOPY N/A 04/18/2021  ? Procedure: FLEXIBLE SIGMOIDOSCOPY;  Surgeon: Gatha Mayer, MD;  Location: [facility] ENDOSCOPY;  Service: Endoscopy;  Laterality: N/A;  ? HERNIA REPAIR  1959  ? IR FLUORO GUIDE CV LINE RIGHT  03/18/2021  ? IR US GUIDE VASC ACCESS RIGHT  03/18/2021  ? LEFT HEART CATH AND CORONARY ANGIOGRAPHY N/A 04/04/2017  ? Procedure: Left Heart Cath and Coronary Angiography;  Surgeon: Belva Crome, MD;  Location: [facility]  INVASIVE CV LAB;  Service: Cardiovascular;  Laterality: N/A;  ? [place], 2004, 2007  ? RHINOPLASTY  1971  ? ROTATOR CUFF REPAIR  2003  ? SURGICAL REPAIR OF HEMORRHAGE  2015  ? TONSILLECTOMY  1968  ? ? ?Vitals:  ? 01/19/22 1250  ?BP: (!) 152/85  ?Pulse: 85  ? ? ? Subjective Assessment - 01/19/22 1250   ? ? Subjective Just arrived from dialysis. While checking in blood glucose monitor went off with reading of 244.   ? Patient is accompained by: Family member   CNA Susie  ? Pertinent History ESRD with HD M-W-Fri:, obesity, DM type 2, CAD, iron deficiency anemia, bronchial asthma, chronic diastolic CHF, Rt basal ganglia CVA on 04-16-21, steal syndrome RUE due to dialysis vascular access (s/p fistula placement), CKD stage V:  Rt hand weakness; Lt hemiparesis due to CVA   ? Patient Stated Goals "be independent with mobility and  self care"; "be able to drive again"   ? Currently in Pain? Yes   ? Pain Score 4    5/10 in foot/toe  ? Pain Location Wrist   right foot/toe as well  ? Pain Orientation Right   ? Pain Descriptors / Indicators Aching;Sore   ? Pain Type Chronic pain   ? Pain Onset More than a month ago   ? Pain Frequency Constant   ? Aggravating Factors  use, weight bearing; with foot/toe wearing tight shoes and weight bearing   ? Pain Relieving Factors resting it   ? ?  ?  ? ?  ? ? ? ? ? ? [place] Adult PT Treatment/Exercise - 01/19/22 1256   ? ?  ? Transfers  ? Transfers Sit to Stand;Stand to Sit   ? Sit to Stand 5: Supervision;With upper extremity assist;From bed;From chair/3-in-1   ? Stand to Sit 5: Supervision;With upper extremity assist;To bed;To chair/3-in-1   ? Number of Reps Other reps (comment)   ? Comments x8 reps with light to no UE support with emphasis on anterior weight shifting and use of arms/legs for slow descent   ?  ? Ambulation/Gait  ? Ambulation/Gait Yes   ? Ambulation/Gait Assistance 4: Min guard   ? Ambulation Distance (Feet) 100 Feet   1, plus around clinic with session  ? Assistive device Rolling walker   ? Gait Pattern Step-through pattern;Decreased hip/knee flexion - left;Decreased step length - left   ? Ambulation Surface Level;Indoor   ?  ? Knee/Hip Exercises: Aerobic  ? Recumbent Bike Scifit level 4.0 with UE/LE's x 8 minutes with goal >/= 75 steps per minute for strengthening and activity tolerance. paused about half way for spouse to give pt insuline for elevated blood glucose reading.   ? ?  ?  ? ?  ? ? ? ? ? ? ? PT Short Term Goals - 01/18/22 2049   ? ?  ? PT SHORT TERM GOAL #1  ? Title Improve TUG score from 32 secs to </= 27 secs with RW with SBA to demo improved functional mobility. (all STGs due 12-17-21)   ? Baseline 11/18/21: 30.69 sec's with RW, improved from 32 sec's just not to goals;   28.15 secs with RW   ? Status Not Met   ? [place] Date 12/17/21   ?  ? PT SHORT TERM GOAL #2  ? Title Pt will  perform 5 times sit to stand transfers from standard chair with only 1 UE support with SBA to demo increased LE strength and  balance. Update:  Perform sit to stand without UE support from mat for 3 reps to demo improved LE strength.   ? Baseline 11/18/21: 17.94 sec's with single UE support from standard height surface with retropulstion noted with each rep, min guard assist for safety; met on 12-21-21 with no LOB upon initial standing   ? Status Achieved   ? [place] Date 12/17/21   ?  ? PT SHORT TERM GOAL #3  ? Title Increase Berg balance test score from 13/30 to >/= 19/30 to demo improved balance.  Updated STG; increase score to >/= 38/56   ? Baseline 12/16/21: 40/56 scored today   ? Status Achieved   ?  ? PT SHORT TERM GOAL #4  ? Title Increase gait velocity by at least .4 ft/sec with RW to demo improved gait efficiency. Revised updated LTG:  increase gait velocity to >/= 2.0 ft/sec with RW   ? Baseline 11/02/21: 1.17 ft/sec with RW as baselne:    18.4, 18.82 = 1.74 ft/sec.   17.03 secs = 1.92 ft/sec   ? Time 4   ? Period Weeks   ? Status Not Met   ? Target Date 12/17/21   ?  ? PT SHORT TERM GOAL #5  ? Title Amb. 230' with RW with SBA on flat, even surface for increased community accessibility.   ? Baseline 12/16/21: met in session today   ? Status Achieved   ?  ? PT SHORT TERM GOAL #6  ? Title Independent with HEP for strengthening & balance exs.   ? Baseline 11/18/21: met with current program per pt and CNA   ? Status Achieved   ? Target Date 11/19/21   ? ?  ?  ? ?  ? ? ? ? PT Long Term Goals - 01/18/22 2049   ? ?  ? PT LONG TERM GOAL #1  ? Title Improve TUG score to </= 20 secs with RW with supervision to reduce fall risk.   ? Baseline 32 secs with RW;  29.56 secs with RW on 01-11-22   ? Time 5   ? Period Weeks   ? Status On-going   ? Target Date 02/18/22   ?  ? PT LONG TERM GOAL #2  ? Title Pt will amb. 500' with RW with SBA on flat, even surface for increased community accessibility.   ? Baseline 41' with RW;   230'  with RW on 01-11-22   ? Time 5   ? Period Weeks   ? Status On-going   ? [place] Date 02/18/22   ?  ? PT LONG TERM GOAL #3  ? Title Increase Berg balance test score from 13/56 to >/= 28/56 to reduce fall risk.

## 2022-01-21 ENCOUNTER — Telehealth: Payer: Self-pay | Admitting: Interventional Cardiology

## 2022-01-21 DIAGNOSIS — Z992 Dependence on renal dialysis: Secondary | ICD-10-CM | POA: Diagnosis not present

## 2022-01-21 DIAGNOSIS — N2581 Secondary hyperparathyroidism of renal origin: Secondary | ICD-10-CM | POA: Diagnosis not present

## 2022-01-21 DIAGNOSIS — N186 End stage renal disease: Secondary | ICD-10-CM | POA: Diagnosis not present

## 2022-01-21 NOTE — Telephone Encounter (Signed)
Spoke with the patient's husband who states that his wife has an appointment with Dr. Tamala Julian on Tuesday 4/4. He would like to know if Dr. Tamala Julian can see him at the same time. He does not have any concerns but states that he is overdue for a follow up.  ?Offered the patient an opening on Monday but he is unavailable at that time. Advised that I would send a message to his nurse who will call him back when she returns next week. ?

## 2022-01-21 NOTE — Telephone Encounter (Signed)
Husband of the patient called. He asked to speak to Dr. Tamala Julian. He said it is not urgent but would still like to talk to Dr. Tamala Julian  ?

## 2022-01-24 DIAGNOSIS — Z992 Dependence on renal dialysis: Secondary | ICD-10-CM | POA: Diagnosis not present

## 2022-01-24 DIAGNOSIS — N2581 Secondary hyperparathyroidism of renal origin: Secondary | ICD-10-CM | POA: Diagnosis not present

## 2022-01-24 DIAGNOSIS — N186 End stage renal disease: Secondary | ICD-10-CM | POA: Diagnosis not present

## 2022-01-24 NOTE — Progress Notes (Signed)
?Cardiology Office Note:   ? ?Date:  01/25/2022  ? ?Carolyn Russo, DOB 02-04-1951, MRN [id number] ? ?PCP:  Carolyn Chard, MD  ?Cardiologist:  Carolyn Grooms, MD  ? ?Referring MD: Carolyn Chard, MD  ? ?Chief Complaint  ?Patient presents with  ? Hypertension  ? Coronary Artery Disease  ? Hyperlipidemia  ? ? ?History of Present Illness:   ? ?Carolyn Russo is a 71 y.o. female with a hx of  diabetes mellitus 2, primary hypertension, CVA/TIA June 2022 right basal ganglia, cognitive impairment, history of acute GI bleeding, and stage kidney disease on chronic dialysis (Dr. Danie Russo), hyperlipidemia, and nonobstructive coronary disease (cardiac catheterization in 2018).  ? ?Carolyn Russo is here today.  I have not seen her in over a year.  Since last seeing her, multiple health issues have occurred including a stroke, GI bleed, and ultimately ending on chronic dialysis therapy. ? ?Prior to dialysis she had volume overload, pulmonary congestion, and peripheral edema.  This has thankfully gotten much better on dialysis. ? ?She is on a very long list of medications.  We need to be careful for the potential of drug interaction. ? ?She denies chest pain, orthopnea, palpitations and syncope.  Lower extremity edema has improved. ? ?Past Medical History:  ?Diagnosis Date  ? Acute GI bleeding   ? Allergy   ? Anemia   ? Anterior chest wall pain   ? Appendicitis 1965  ? Asthma   ? Body mass index 37.0-37.9, adult   ? Breast pain   ? Cataract   ? both eyes  ? CHF (congestive heart failure) ([place])   ? Chronic kidney disease   ? stage 5 - on dialysis  ? Cognitive change 04/20/2021  ? r/t cva 03/2821  ? Dehydration 2014  ? Deviated septum 1971  ? Diabetes mellitus   ? no meds  ? Dyspnea 2014  ? Extrinsic asthma   ? WITH ASTHMA ATTACK  ? Fibroid 1980  ? GERD (gastroesophageal reflux disease)   ? Heart murmur   ? Hx gestational diabetes   ? Hyperlipidemia   ? Hypertension 2014  ? Inguinal hernia 1959  ? Malaise and fatigue 2014   ? Non-IgE mediated allergic asthma 2014  ? Obesity   ? Pelvic pain   ? Pregnancy, high-risk 1985  ? Stroke [facility]) 04/20/2021  ? (CVA) of right basal ganglia  ? Tonsillitis 1968  ? Uterine fibroid 1980  ? ? ?Past Surgical History:  ?Procedure Laterality Date  ? APPENDECTOMY  1959  ? BASCILIC VEIN TRANSPOSITION Right 04/30/2021  ? Procedure: RIGHT FIRST STAGE [place];  Surgeon: Angelia Mould, MD;  Location: [place];  Service: Vascular;  Laterality: Right;  ? [place]  ? COLONOSCOPY    ? ESOPHAGOGASTRODUODENOSCOPY (EGD) WITH PROPOFOL N/A 04/18/2021  ? Procedure: ESOPHAGOGASTRODUODENOSCOPY (EGD) WITH PROPOFOL;  Surgeon: Gatha Mayer, MD;  Location: [facility] ENDOSCOPY;  Service: Endoscopy;  Laterality: N/A;  ? EYE SURGERY    ? bilateral cataract   ? FLEXIBLE SIGMOIDOSCOPY N/A 04/18/2021  ? Procedure: FLEXIBLE SIGMOIDOSCOPY;  Surgeon: Gatha Mayer, MD;  Location: [facility] ENDOSCOPY;  Service: Endoscopy;  Laterality: N/A;  ? HERNIA REPAIR  1959  ? IR FLUORO GUIDE CV LINE RIGHT  03/18/2021  ? IR US GUIDE VASC ACCESS RIGHT  03/18/2021  ? LEFT HEART CATH AND CORONARY ANGIOGRAPHY N/A 04/04/2017  ? Procedure: Left Heart Cath and Coronary Angiography;  Surgeon: Belva Crome, MD;  Location: [place] CV LAB;  Service: Cardiovascular;  Laterality: N/A;  ? [place], 2004, 2007  ? RHINOPLASTY  1971  ? ROTATOR CUFF REPAIR  2003  ? SURGICAL REPAIR OF HEMORRHAGE  2015  ? TONSILLECTOMY  1968  ? ? ?Current Medications: ?Current Meds  ?Medication Sig  ? acetaminophen (OFIRMEV) 10 MG/ML SOLN Take by mouth.  ? albuterol (VENTOLIN HFA) 108 (90 Base) MCG/ACT inhaler Inhale 2 puffs into the lungs every 4 (four) hours as needed for wheezing or shortness of breath.  ? carvedilol (COREG) 3.125 MG tablet Take 1 tablet (3.125 mg total) by mouth 2 (two) times daily with a meal.  ? cephALEXin (KEFLEX) 500 MG capsule Take 1 capsule (500 mg total) by mouth 3 (three) times a week for 10 doses. After dialysis  ?  clopidogrel (PLAVIX) 75 MG tablet Take 1 tablet (75 mg total) by mouth daily.  ? Continuous Blood Gluc Receiver (FREESTYLE LIBRE 2 READER) DEVI by Does not apply route.  ? diclofenac Sodium (VOLTAREN) 1 % GEL Apply 2 g topically 4 (four) times daily. To left wrist  ? diphenhydrAMINE (BENADRYL) 25 MG tablet Take 25 mg by mouth every 6 (six) hours as needed for itching.  ? fluticasone (FLONASE) 50 MCG/ACT nasal spray Place 2 sprays into the nose daily as needed for allergies.   ? hydrALAZINE (APRESOLINE) 10 MG tablet Take 1 tablet (10 mg total) by mouth every 8 (eight) hours as needed (Sys BP >180, hold on HD days).  ? hydrocortisone (ANUSOL-HC) 2.5 % rectal cream Place 1 application rectally 2 (two) times daily as needed for hemorrhoids or anal itching.  ? hydrocortisone cream 1 % Apply to affected area 2 times daily  ? insulin aspart protamine - aspart (NOVOLOG MIX 70/30 FLEXPEN) (70-30) 100 UNIT/ML FlexPen 35uam / 5u/25upm  ? insulin glargine (LANTUS) 100 UNIT/ML Solostar Pen Inject 13 Units into the skin daily. (Patient taking differently: Inject 12 Units into the skin daily.)  ? Insulin Pen Needle (BD PEN NEEDLE NANO 2ND GEN) 32G X 4 MM MISC See admin instructions.  ? iron sucrose in sodium chloride 0.9 % 100 mL Inject into the vein.  ? levETIRAcetam (KEPPRA) 500 MG tablet TAKE 1 TABLET(500 MG) BY MOUTH AT BEDTIME  ? melatonin 3 MG TABS tablet Take 1 tablet (3 mg total) by mouth at bedtime.  ? Methoxy PEG-Epoetin Beta (MIRCERA IJ) Mircera  ? methylphenidate (RITALIN) 5 MG tablet Take 1 tablet (5 mg total) by mouth 2 (two) times daily with breakfast and lunch. (Patient taking differently: Take 5 mg by mouth daily.)  ? mometasone-formoterol (DULERA) 200-5 MCG/ACT AERO Inhale 2 puffs into the lungs 2 (two) times daily.  ? mupirocin ointment (BACTROBAN) 2 % Apply 1 application. topically 2 (two) times daily.  ? Nutritional Supplements (,FEEDING SUPPLEMENT, PROSOURCE PLUS) liquid Take 30 mLs by mouth 2 (two) times  daily between meals.  ? ondansetron (ZOFRAN) 4 MG tablet Take 1 tablet (4 mg total) by mouth every 6 (six) hours as needed for nausea or vomiting.  ? OneTouch Delica Lancets 37T MISC Use as directed to check blood sugars before breakfast and dinner dx: e11.65  ? phenylephrine (,USE FOR PREPARATION-H,) 0.25 % suppository Place 1 suppository rectally 2 (two) times daily.  ? polyethylene glycol powder (GLYCOLAX/MIRALAX) 17 GM/SCOOP powder Take 17 g by mouth daily.  ? rosuvastatin (CRESTOR) 20 MG tablet Take 1 tablet (20 mg total) by mouth daily.  ? senna-docusate (SENOKOT-S) 8.6-50 MG tablet Take 2 tablets  by mouth 2 (two) times daily.  ?  ? ?Allergies:   Food, Other, Wheat bran, Statins, Iodine, Shellfish allergy, Sitagliptin, Tetracycline, Tetracycline hcl, Tetracyclines & related, and Contrast media [iodinated contrast media]  ? ?Social History  ? ?Socioeconomic History  ? Marital status: Married  ?  Spouse name: [name]  ? Number of children: 1  ? Years of education: Not on file  ? Highest education level: Professional school degree (e.g., MD, DDS, DVM, JD)  ?Occupational History  ? Occupation: retired  ?Tobacco Use  ? Smoking status: Never  ? Smokeless tobacco: Never  ?Vaping Use  ? Vaping Use: Never used  ?Substance and Sexual Activity  ? Alcohol use: No  ? Drug use: No  ? Sexual activity: Not Currently  ?  Birth control/protection: Post-menopausal  ?Other Topics Concern  ? Not on file  ?Social History Narrative  ? 06/21/21 lives with husband  ? Patient reports she used to walk at [employer] but now due to Covid-19 she is staying inside.  ? ?Social Determinants of Health  ? ?Financial Resource Strain: Low Risk   ? Difficulty of Paying Living Expenses: Not hard at all  ?Food Insecurity: No Food Insecurity  ? Worried About Charity fundraiser in the Last Year: Never true  ? Ran Out of Food in the Last Year: Never true  ?Transportation Needs: No Transportation Needs  ? Lack of Transportation (Medical): No  ? Lack of  Transportation (Non-Medical): No  ?Physical Activity: Inactive  ? Days of Exercise per Week: 0 days  ? Minutes of Exercise per Session: 0 min  ?Stress: Stress Concern Present  ? Feeling of Stress : To some extent

## 2022-01-25 ENCOUNTER — Ambulatory Visit: Payer: Medicare PPO | Admitting: Interventional Cardiology

## 2022-01-25 ENCOUNTER — Ambulatory Visit: Payer: Medicare PPO | Admitting: Physical Therapy

## 2022-01-25 ENCOUNTER — Ambulatory Visit: Payer: Medicare PPO | Attending: Internal Medicine | Admitting: Occupational Therapy

## 2022-01-25 ENCOUNTER — Encounter: Payer: Self-pay | Admitting: Interventional Cardiology

## 2022-01-25 VITALS — BP 150/78 | HR 72 | Ht 63.5 in | Wt 203.5 lb

## 2022-01-25 DIAGNOSIS — M6281 Muscle weakness (generalized): Secondary | ICD-10-CM | POA: Insufficient documentation

## 2022-01-25 DIAGNOSIS — I1311 Hypertensive heart and chronic kidney disease without heart failure, with stage 5 chronic kidney disease, or end stage renal disease: Secondary | ICD-10-CM | POA: Diagnosis not present

## 2022-01-25 DIAGNOSIS — N186 End stage renal disease: Secondary | ICD-10-CM | POA: Diagnosis not present

## 2022-01-25 DIAGNOSIS — R2681 Unsteadiness on feet: Secondary | ICD-10-CM

## 2022-01-25 DIAGNOSIS — I69354 Hemiplegia and hemiparesis following cerebral infarction affecting left non-dominant side: Secondary | ICD-10-CM | POA: Insufficient documentation

## 2022-01-25 DIAGNOSIS — Z992 Dependence on renal dialysis: Secondary | ICD-10-CM

## 2022-01-25 DIAGNOSIS — R2689 Other abnormalities of gait and mobility: Secondary | ICD-10-CM

## 2022-01-25 DIAGNOSIS — M79641 Pain in right hand: Secondary | ICD-10-CM | POA: Insufficient documentation

## 2022-01-25 DIAGNOSIS — M25512 Pain in left shoulder: Secondary | ICD-10-CM | POA: Diagnosis not present

## 2022-01-25 DIAGNOSIS — I2510 Atherosclerotic heart disease of native coronary artery without angina pectoris: Secondary | ICD-10-CM | POA: Diagnosis not present

## 2022-01-25 DIAGNOSIS — E785 Hyperlipidemia, unspecified: Secondary | ICD-10-CM | POA: Diagnosis not present

## 2022-01-25 DIAGNOSIS — R278 Other lack of coordination: Secondary | ICD-10-CM | POA: Diagnosis not present

## 2022-01-25 DIAGNOSIS — I129 Hypertensive chronic kidney disease with stage 1 through stage 4 chronic kidney disease, or unspecified chronic kidney disease: Secondary | ICD-10-CM | POA: Diagnosis not present

## 2022-01-25 DIAGNOSIS — N183 Chronic kidney disease, stage 3 unspecified: Secondary | ICD-10-CM | POA: Diagnosis not present

## 2022-01-25 DIAGNOSIS — E1165 Type 2 diabetes mellitus with hyperglycemia: Secondary | ICD-10-CM

## 2022-01-25 DIAGNOSIS — R208 Other disturbances of skin sensation: Secondary | ICD-10-CM | POA: Insufficient documentation

## 2022-01-25 NOTE — Therapy (Signed)
[facility] ?[place], [name], [address] ?Phone: [phone]   Fax:  [phone] ? ?Physical Therapy Treatment ? ?Patient Details  ?Name: Carolyn Russo ?MRN: [id number] ?Date of Birth: 12/25/1950 ?Referring Provider (PT): Courtney Heys, MD ? ? ?Encounter Date: 01/25/2022 ? ? PT End of Session - 01/25/22 1332   ? ? Visit Number 21   ? Number of Visits 28   ? Date for PT Re-Evaluation 02/18/22   ? Authorization Type [employer] Medicare-auth submitted   ? Authorization Time Period 24 visits from 10/24/21- 01/14/22; renewal for [employer] requested for dates 01-13-22 - 02-18-22 for 10 additional visits   ? Authorization - Visit Number 21   ? Authorization - Number of Visits 28   ? Progress Note Due on Visit 29   ? PT Start Time 1147   ? PT Stop Time 1226   ? PT Time Calculation (min) 39 min   ? Equipment Utilized During Treatment Gait belt   ? Activity Tolerance Patient tolerated treatment well;Patient limited by fatigue   ? Behavior During Therapy [facility] for tasks assessed/performed   ? ?  ?  ? ?  ? ? ?Past Medical History:  ?Diagnosis Date  ? Acute GI bleeding   ? Allergy   ? Anemia   ? Anterior chest wall pain   ? Appendicitis 1965  ? Asthma   ? Body mass index 37.0-37.9, adult   ? Breast pain   ? Cataract   ? both eyes  ? CHF (congestive heart failure) ([place])   ? Chronic kidney disease   ? stage 5 - on dialysis  ? Cognitive change 04/20/2021  ? r/t cva 03/2821  ? Dehydration 2014  ? Deviated septum 1971  ? Diabetes mellitus   ? no meds  ? Dyspnea 2014  ? Extrinsic asthma   ? WITH ASTHMA ATTACK  ? Fibroid 1980  ? GERD (gastroesophageal reflux disease)   ? Heart murmur   ? Hx gestational diabetes   ? Hyperlipidemia   ? Hypertension 2014  ? Inguinal hernia 1959  ? Malaise and fatigue 2014  ? Non-IgE mediated allergic asthma 2014  ? Obesity   ? Pelvic pain   ? Pregnancy, high-risk 1985  ? Stroke [facility]) 04/20/2021  ? (CVA) of right basal ganglia  ? Tonsillitis 1968  ?  Uterine fibroid 1980  ? ? ?Past Surgical History:  ?Procedure Laterality Date  ? APPENDECTOMY  1959  ? BASCILIC VEIN TRANSPOSITION Right 04/30/2021  ? Procedure: RIGHT FIRST STAGE [place];  Surgeon: Angelia Mould, MD;  Location: [facility];  Service: Vascular;  Laterality: Right;  ? [place]  ? COLONOSCOPY    ? ESOPHAGOGASTRODUODENOSCOPY (EGD) WITH PROPOFOL N/A 04/18/2021  ? Procedure: ESOPHAGOGASTRODUODENOSCOPY (EGD) WITH PROPOFOL;  Surgeon: Gatha [facility], MD;  Location: [facility] ENDOSCOPY;  Service: Endoscopy;  Laterality: N/A;  ? EYE SURGERY    ? bilateral cataract   ? FLEXIBLE SIGMOIDOSCOPY N/A 04/18/2021  ? Procedure: FLEXIBLE SIGMOIDOSCOPY;  Surgeon: Gatha [facility], MD;  Location: [facility] ENDOSCOPY;  Service: Endoscopy;  Laterality: N/A;  ? HERNIA REPAIR  1959  ? IR FLUORO GUIDE CV LINE RIGHT  03/18/2021  ? IR US GUIDE VASC ACCESS RIGHT  03/18/2021  ? LEFT HEART CATH AND CORONARY ANGIOGRAPHY N/A 04/04/2017  ? Procedure: Left Heart Cath and Coronary Angiography;  Surgeon: Belva Crome, MD;  Location: [facility] CV LAB;  Service: Cardiovascular;  Laterality: N/A;  ?  [place], 2004, 2007  ? RHINOPLASTY  1971  ? ROTATOR CUFF REPAIR  2003  ? SURGICAL REPAIR OF HEMORRHAGE  2015  ? TONSILLECTOMY  1968  ? ? ?There were no vitals filed for this visit. ? ? Subjective Assessment - 01/25/22 1154   ? ? Subjective No new complaints; has glove on Rt hand due to having ointment (Voltaren gel) on Rt hand; has echogram schedule on Thursday   ? Patient is accompained by: Family member   CNA Susie  ? Pertinent History ESRD with HD M-W-Fri:, obesity, DM type 2, CAD, iron deficiency anemia, bronchial asthma, chronic diastolic CHF, Rt basal ganglia CVA on 04-16-21, steal syndrome RUE due to dialysis vascular access (s/p fistula placement), CKD stage V:  Rt hand weakness; Lt hemiparesis due to CVA   ? Patient Stated Goals "be independent with mobility and self care"; "be able to drive again"   ?  Currently in Pain? Yes   ? Pain Score 4    ? Pain Location Wrist   ? Pain Orientation Right   ? Pain Descriptors / Indicators Aching;Sore   ? Pain Type Chronic pain   ? Pain Onset More than a month ago   ? Pain Frequency Constant   ? Multiple Pain Sites Yes   ? Pain Score 6   ? Pain Location Foot   ? Pain Orientation Right   ? Pain Descriptors / Indicators Aching   ? Pain Type Acute pain   ? Pain Onset 1 to 4 weeks ago   ? Pain Frequency Intermittent   ? Aggravating Factors  touching it makes it worse   ? Pain Relieving Factors elevating it helps   ? ?  ?  ? ?  ? ? ? ? ? ? ? ? ? ? ? ? ? ? ? ? ? ? ? ? [facility] Adult PT Treatment/Exercise - 01/25/22 1213   ? ?  ? Transfers  ? Transfers Sit to Stand;Stand to Sit   ? Sit to Stand 5: Supervision   ? Stand to Sit 5: Supervision   ? Comments x 3 reps with RUE support from mat - pt unable to stand without UE support at start of session   ?  ? Ambulation/Gait  ? Ambulation/Gait Yes   ? Ambulation/Gait Assistance 4: Min guard   ? Ambulation Distance (Feet) 115 Feet   60' x 1  ? Assistive device Rolling walker   ? Gait Pattern Step-through pattern;Decreased hip/knee flexion - left;Decreased step length - left   ? Ambulation Surface Level;Indoor   ?  ? Knee/Hip Exercises: Standing  ? Hip Flexion Stengthening;Right;Left;1 set;10 reps   2# weight used  ? Hip Abduction Stengthening;Right;Left;1 set;10 reps   2# weight  ? Hip Extension Stengthening;1 set;10 reps   2# used  ? Forward Step Up Right;Left;1 set;5 reps   with UE support on hand rails  ? ?  ?  ? ?  ? ? ? ? ? ? Balance Exercises - 01/25/22 0001   ? ?  ? Balance Exercises: Standing  ? Rockerboard Anterior/posterior;EO;10 reps;UE support   bil. UE support on // bars  ? Step Over Hurdles / Cones stepping over black beam 5 reps each foot forward and then 5 reps laterally 5 reps each foot with UE support on // bars   ? Marching Solid surface;10 reps   with bil. UE support  ? Other Standing Exercises Pt performed cone taps to 2  cones - straight  ahead 5 reps each foot, then diagonals 5 reps each foot with UE support   ? ?  ?  ? ?  ? ? ? ? ? ? ? PT Short Term Goals - 01/25/22 1338   ? ?  ? PT SHORT TERM GOAL #1  ? Title Improve TUG score from 32 secs to </= 27 secs with RW with SBA to demo improved functional mobility. (all STGs due 12-17-21)   ? Baseline 11/18/21: 30.69 sec's with RW, improved from 32 sec's just not to goals;   28.15 secs with RW   ? Status Not Met   ? Target Date 12/17/21   ?  ? PT SHORT TERM GOAL #2  ? Title Pt will perform 5 times sit to stand transfers from standard chair with only 1 UE support with SBA to demo increased LE strength and balance. Update:  Perform sit to stand without UE support from mat for 3 reps to demo improved LE strength.   ? Baseline 11/18/21: 17.94 sec's with single UE support from standard height surface with retropulstion noted with each rep, min guard assist for safety; met on 12-21-21 with no LOB upon initial standing   ? Status Achieved   ? Target Date 12/17/21   ?  ? PT SHORT TERM GOAL #3  ? Title Increase Berg balance test score from 13/30 to >/= 19/30 to demo improved balance.  Updated STG; increase score to >/= 38/56   ? Baseline 12/16/21: 40/56 scored today   ? Status Achieved   ?  ? PT SHORT TERM GOAL #4  ? Title Increase gait velocity by at least .4 ft/sec with RW to demo improved gait efficiency. Revised updated LTG:  increase gait velocity to >/= 2.0 ft/sec with RW   ? Baseline 11/02/21: 1.17 ft/sec with RW as baselne:    18.4, 18.82 = 1.74 ft/sec.   17.03 secs = 1.92 ft/sec   ? Time 4   ? Period Weeks   ? Status Not Met   ? Target Date 12/17/21   ?  ? PT SHORT TERM GOAL #5  ? Title Amb. 230' with RW with SBA on flat, even surface for increased community accessibility.   ? Baseline 12/16/21: met in session today   ? Status Achieved   ?  ? PT SHORT TERM GOAL #6  ? Title Independent with HEP for strengthening & balance exs.   ? Baseline 11/18/21: met with current program per pt and CNA   ?  Status Achieved   ? Target Date 11/19/21   ? ?  ?  ? ?  ? ? ? ? PT Long Term Goals - 01/25/22 1338   ? ?  ? PT LONG TERM GOAL #1  ? Title Improve TUG score to </= 20 secs with RW with supervision to reduce fall

## 2022-01-25 NOTE — Patient Instructions (Signed)
Medication Instructions:  Your physician recommends that you continue on your current medications as directed. Please refer to the Current Medication list given to you today.  *If you need a refill on your cardiac medications before your next appointment, please call your pharmacy*   Lab Work: None If you have labs (blood work) drawn today and your tests are completely normal, you will receive your results only by: MyChart Message (if you have MyChart) OR A paper copy in the mail If you have any lab test that is abnormal or we need to change your treatment, we will call you to review the results.   Testing/Procedures: None   Follow-Up: At [facility], you and your health needs are our priority.  As part of our continuing mission to provide you with exceptional heart care, we have created designated Provider Care Teams.  These Care Teams include your primary Cardiologist (physician) and Advanced Practice Providers (APPs -  Physician Assistants and Nurse Practitioners) who all work together to provide you with the care you need, when you need it.  We recommend signing up for the patient portal called "MyChart".  Sign up information is provided on this After Visit Summary.  MyChart is used to connect with patients for Virtual Visits (Telemedicine).  Patients are able to view lab/test results, encounter notes, upcoming appointments, etc.  Non-urgent messages can be sent to your provider as well.   To learn more about what you can do with MyChart, go to [email or web address].    Your next appointment:   1 year(s)  The format for your next appointment:   In Person  Provider:   Henry W Smith III, MD     Other Instructions   

## 2022-01-25 NOTE — Therapy (Signed)
[place] [facility] ?[address] ?, [place], [address] ?Phone: [phone]   Fax:  [phone] ? ?Occupational Therapy Treatment ? ?Patient Details  ?Name: Carolyn Russo ?MRN: [id number] ?Date of Birth: 02/03/1951 ?Referring Provider (OT): Dr. Lovorn ? ? ?Encounter Date: 01/25/2022 ? ? OT End of Session - 01/25/22 1607   ? ? Visit Number 19   ? Number of Visits 32   ? Date for OT Re-Evaluation 03/09/22   ? Authorization Type [employer]   ? Authorization Time Period 27 visits 10/24/21 - 02/10/22   ? Authorization - Visit Number 19   ? Authorization - Number of Visits 27   ? Progress Note Due on Visit 20   ? OT Start Time 1240   pt in BR  ? OT Stop Time 1315   ? OT Time Calculation (min) 35 min   ? Activity Tolerance Patient tolerated treatment well;Patient limited by fatigue   ? Behavior During Therapy WFL for tasks assessed/performed   ? ?  ?  ? ?  ? ? ?Past Medical History:  ?Diagnosis Date  ? Acute GI bleeding   ? Allergy   ? Anemia   ? Anterior chest wall pain   ? Appendicitis 1965  ? Asthma   ? Body mass index 37.0-37.9, adult   ? Breast pain   ? Cataract   ? both eyes  ? CHF (congestive heart failure) ([facility])   ? Chronic kidney disease   ? stage 5 - on dialysis  ? Cognitive change 04/20/2021  ? r/t cva 03/2821  ? Dehydration 2014  ? Deviated septum 1971  ? Diabetes mellitus   ? no meds  ? Dyspnea 2014  ? Extrinsic asthma   ? WITH ASTHMA ATTACK  ? Fibroid 1980  ? GERD (gastroesophageal reflux disease)   ? Heart murmur   ? Hx gestational diabetes   ? Hyperlipidemia   ? Hypertension 2014  ? Inguinal hernia 1959  ? Malaise and fatigue 2014  ? Non-IgE mediated allergic asthma 2014  ? Obesity   ? Pelvic pain   ? Pregnancy, high-risk 1985  ? Stroke ([facility]) 04/20/2021  ? (CVA) of right basal ganglia  ? Tonsillitis 1968  ? Uterine fibroid 1980  ? ? ?Past Surgical History:  ?Procedure Laterality Date  ? APPENDECTOMY  1959  ? BASCILIC VEIN TRANSPOSITION Right 04/30/2021  ?  Procedure: RIGHT FIRST STAGE BASCILIC VEIN TRANSPOSITION;  Surgeon: Dickson, Christopher S, MD;  Location: [facility] OR;  Service: Vascular;  Laterality: Right;  ? CESAREAN SECTION  1985  ? COLONOSCOPY    ? ESOPHAGOGASTRODUODENOSCOPY (EGD) WITH PROPOFOL N/A 04/18/2021  ? Procedure: ESOPHAGOGASTRODUODENOSCOPY (EGD) WITH PROPOFOL;  Surgeon: Gessner, Carl E, MD;  Location: [facility] ENDOSCOPY;  Service: Endoscopy;  Laterality: N/A;  ? EYE SURGERY    ? bilateral cataract   ? FLEXIBLE SIGMOIDOSCOPY N/A 04/18/2021  ? Procedure: FLEXIBLE SIGMOIDOSCOPY;  Surgeon: Gessner, Carl E, MD;  Location: [facility] ENDOSCOPY;  Service: Endoscopy;  Laterality: N/A;  ? HERNIA REPAIR  1959  ? IR FLUORO GUIDE CV LINE RIGHT  03/18/2021  ? IR US GUIDE VASC ACCESS RIGHT  03/18/2021  ? LEFT HEART CATH AND CORONARY ANGIOGRAPHY N/A 04/04/2017  ? Procedure: Left Heart Cath and Coronary Angiography;  Surgeon: Smith, Henry W, MD;  Location: [facility] INVASIVE CV LAB;  Service: Cardiovascular;  Laterality: N/A;  ? MYOMECTOMY  1980, 2004, 2007  ? RHINOPLASTY  1971  ? ROTATOR CUFF REPAIR  2003  ? SURGICAL REPAIR   OF HEMORRHAGE  2015  ? TONSILLECTOMY  1968  ? ? ?There were no vitals filed for this visit. ? ? Subjective Assessment - 01/25/22 1613   ? ? Subjective  Pt reposts left shoulder pain   ? Patient is accompanied by: --   caregiver, Carolyn Russo  ? Pertinent History [name] is a 71 y.o. female with PMHx of obesity, DM, HTN, HLD, ESRD on HD who presented with left sided weakness for 3 days on 04/16/2021. Pt was diagnosed with R basal ganglia infarct Pt went to rehab but then transferred back to acute service on 7/13 for likely seizure. Pt returned to CIR and was d/c home on 05/27/21. Pt received HHOT and PT. Pt has R hand weakness s/p fistula placement. PMH: diabetes, hypertension, hyperlipidemia, ESRD on HD MWF.   ? Patient Stated Goals to be like I was prior to my stroke   ? Currently in Pain? Yes   ? Pain Score 4    ? Pain Location Wrist   ? Pain Orientation Right   ? Pain  Descriptors / Indicators Aching   ? Pain Type Chronic pain   ? Pain Onset More than a month ago   ? Pain Frequency Constant   ? Aggravating Factors  use   ? Pain Relieving Factors rest   ? ?  ?  ? ?  ? ? ? ? ? ? ? ? ? ?Treatment: doffing vest by putting overhead, mod A, then donning with min A and v.c  ?Folding towels and washcloths using bilateral UE's, min v.c and facilitation for right hand use. ? ? ? ? ? ? ? ? ? ? ? ? ? OT Treatment/Education - 01/25/22 1611   ? ? Education Details Reveiwed updated HEP issued including yellow putty for right lateral pinch and left composite grip-  and supine closed chain shoulder flexion and chest press, min-mod v.c and facilitation, pt remains drowsy   ? Person(s) Educated Associate Professor)   ? Methods Explanation;Demonstration;Verbal cues   ? Comprehension Verbalized understanding;Returned demonstration;Verbal cues required;Tactile cues required   ? ?  ?  ? ?  ? ? ? OT Short Term Goals - 01/11/22 1329   ? ?  ? OT SHORT TERM GOAL #1  ? Title I with initial HEP   ? Time 4   ? Period Weeks   ? Status Achieved   ? Target Date 11/18/21   ?  ? OT SHORT TERM GOAL #2  ? Title Pt will donn shirt  and bathe UB with min A consistently   ? Time 4   ? Period Weeks   ? Status Achieved   met pt donns shirt without assistance 3 days per week, and she reports assisting with bathing  ?  ? OT SHORT TERM GOAL #3  ? Title Pt will be I with bilateral UE positioning including splinting  to minimize pain and risk for contracture and injury.   ? Time 4   ? Period Weeks   ? Status Achieved   met, pt verbalizes understanding  ?  ? OT SHORT TERM GOAL #4  ? Title Pt will demonstrate 60* LUE shoulder flexion in prep for functional reach   ? Time 4   ? Period Weeks   ? Status Achieved   60*  ?  ? OT SHORT TERM GOAL #5  ? Title Pt will demonstrate improved bilateral UE functional use as evidenced by increasing bilateral box/ blocks score by 3 blocks.   ?  Time 4   ? Period Weeks   ? Status Achieved   R 20,  L 30  ?  ? OT SHORT TERM GOAL #6  ? Title Pt will demonstrate improved bilateral UE functional use as evidenced by increasing bilateral box/ blocks score to 23 blocks for RUE and 33 for LUE   ? Baseline R 20, L 30 at renewal   ? Time 4   ? Period Weeks   ? Status New   ?  ? OT SHORT TERM GOAL #7  ? Title Pt will demonstrate 60* LUE shoulder flexion in prep for functional reach   ? Baseline 60* at renewal   ? Time 4   ? Period Weeks   ? Status New   ?  ? OT SHORT TERM GOAL #8  ? Title Pt will consistently donn pants with no more than min A   ? Time 4   ? Period Weeks   ? Status New   ?  ? OT SHORT TERM GOAL  #9  ? TITLE Pt/ caregiver will verbalize understanding of adpted strategies to increase pt's safety and I with ADLs/ IADLs.   ? Time 4   ? Period Weeks   ? Status New   ? ?  ?  ? ?  ? ? ? ? OT Long Term Goals - 01/11/22 1346   ? ?  ? OT LONG TERM GOAL #1  ? Title Pt will perform UB dressing with setup and LB dressing with min A.   ? Time --   ? Period Weeks   ? Status Partially Met   pt donns shirt following set up  3 days per week, not consistent for LB min-mod A  ?  ? OT LONG TERM GOAL #2  ? Title Pt will bathe herself with min A.   ? Time --   ? Period Weeks   ? Status Achieved   Pt reports bathing with min A  ?  ? OT LONG TERM GOAL #3  ? Title Pt will perform simple home management/ cooking tasks with min A.   ? Time 8   ? Period Weeks   ? Status On-going   folding laundry  ?  ? OT LONG TERM GOAL #4  ? Title Pt will retrieve a lightweight object at 75* shoulder flexion with LUE Revised goal-.Pt will retrieve a lightweight object at 70* shoulder flexion with LUE.   ? Time 8   ? Period Weeks   ? Status Revised   01/11/22-60*  ?  ? OT LONG TERM GOAL #5  ? Title Pt will increase RUE grip strength to 15 lbs for increased functional use.   ? Time 8   ? Period Weeks   ? Status On-going   9.9 lbs  ?  ? OT LONG TERM GOAL #6  ? Title Pt will demonstrate ability to write her name and address with 100% legibility using AE  prn.   ? Time 8   ? Period Weeks   ? Status On-going   not consistent-01/11/22  ?  ? OT LONG TERM GOAL #7  ? Title Pt will utilize RUE at least 75% of the time for ADLS/ IADLS with pain no greater than 4/1

## 2022-01-26 DIAGNOSIS — N2581 Secondary hyperparathyroidism of renal origin: Secondary | ICD-10-CM | POA: Diagnosis not present

## 2022-01-26 DIAGNOSIS — N186 End stage renal disease: Secondary | ICD-10-CM | POA: Diagnosis not present

## 2022-01-26 DIAGNOSIS — Z992 Dependence on renal dialysis: Secondary | ICD-10-CM | POA: Diagnosis not present

## 2022-01-27 ENCOUNTER — Encounter: Payer: Self-pay | Admitting: Physical Therapy

## 2022-01-27 ENCOUNTER — Ambulatory Visit: Payer: Medicare PPO | Admitting: Physical Therapy

## 2022-01-27 ENCOUNTER — Ambulatory Visit (HOSPITAL_COMMUNITY)
Admission: RE | Admit: 2022-01-27 | Discharge: 2022-01-27 | Disposition: A | Discharge status: Home / Self Care | Payer: Medicare PPO | Source: Ambulatory Visit | Attending: Internal Medicine | Admitting: Internal Medicine

## 2022-01-27 ENCOUNTER — Encounter: Payer: Self-pay | Admitting: Occupational Therapy

## 2022-01-27 ENCOUNTER — Ambulatory Visit: Payer: Medicare PPO | Admitting: Occupational Therapy

## 2022-01-27 VITALS — BP 177/92 | HR 77

## 2022-01-27 DIAGNOSIS — L03031 Cellulitis of right toe: Secondary | ICD-10-CM | POA: Diagnosis not present

## 2022-01-27 DIAGNOSIS — R2681 Unsteadiness on feet: Secondary | ICD-10-CM

## 2022-01-27 DIAGNOSIS — M25512 Pain in left shoulder: Secondary | ICD-10-CM | POA: Diagnosis not present

## 2022-01-27 DIAGNOSIS — M6281 Muscle weakness (generalized): Secondary | ICD-10-CM

## 2022-01-27 DIAGNOSIS — I69354 Hemiplegia and hemiparesis following cerebral infarction affecting left non-dominant side: Secondary | ICD-10-CM | POA: Diagnosis not present

## 2022-01-27 DIAGNOSIS — R278 Other lack of coordination: Secondary | ICD-10-CM

## 2022-01-27 DIAGNOSIS — R208 Other disturbances of skin sensation: Secondary | ICD-10-CM

## 2022-01-27 DIAGNOSIS — L97511 Non-pressure chronic ulcer of other part of right foot limited to breakdown of skin: Secondary | ICD-10-CM | POA: Diagnosis not present

## 2022-01-27 DIAGNOSIS — I739 Peripheral vascular disease, unspecified: Secondary | ICD-10-CM | POA: Diagnosis not present

## 2022-01-27 DIAGNOSIS — M79641 Pain in right hand: Secondary | ICD-10-CM

## 2022-01-27 DIAGNOSIS — R2689 Other abnormalities of gait and mobility: Secondary | ICD-10-CM | POA: Diagnosis not present

## 2022-01-27 NOTE — Patient Instructions (Signed)
Use your left hand to: ? ? pick up checkers ? ?Flip playing cards ? ?Use both hands to fold towels, washcloths, or pillow cases ?

## 2022-01-27 NOTE — Therapy (Signed)
[facility] ?[place], [name], [address] ?Phone: [phone]   Fax:  [phone] ? ?Physical Therapy Treatment ? ?Patient Details  ?Name: Carolyn Russo ?MRN: [id number] ?Date of Birth: 04-05-1951 ?Referring Provider (PT): Courtney Heys, MD ? ? ?Encounter Date: 01/27/2022 ? ? PT End of Session - 01/27/22 1332   ? ? Visit Number 22   ? Number of Visits 28   ? Date for PT Re-Evaluation 02/18/22   ? Authorization Type [employer]-auth submitted   ? Authorization Time Period 24 visits from 10/24/21- 01/14/22; renewal for [employer] requested for dates 01-13-22 - 02-18-22 for 10 additional visits   ? Authorization - Visit Number 22   ? Authorization - Number of Visits 28   ? Progress Note Due on Visit 29   ? PT Start Time 3343   pt late for appt today  ? PT Stop Time 1358   ? PT Time Calculation (min) 24 min   ? Equipment Utilized During Treatment Gait belt   ? Activity Tolerance Patient tolerated treatment well;Patient limited by fatigue   ? Behavior During Therapy [facility] for tasks assessed/performed   ? ?  ?  ? ?  ? ? ?Past Medical History:  ?Diagnosis Date  ? Acute GI bleeding   ? Allergy   ? Anemia   ? Anterior chest wall pain   ? Appendicitis 1965  ? Asthma   ? Body mass index 37.0-37.9, adult   ? Breast pain   ? Cataract   ? both eyes  ? CHF (congestive heart failure) ([place])   ? Chronic kidney disease   ? stage 5 - on dialysis  ? Cognitive change 04/20/2021  ? r/t cva 03/2821  ? Dehydration 2014  ? Deviated septum 1971  ? Diabetes mellitus   ? no meds  ? Dyspnea 2014  ? Extrinsic asthma   ? WITH ASTHMA ATTACK  ? Fibroid 1980  ? GERD (gastroesophageal reflux disease)   ? Heart murmur   ? Hx gestational diabetes   ? Hyperlipidemia   ? Hypertension 2014  ? Inguinal hernia 1959  ? Malaise and fatigue 2014  ? Non-IgE mediated allergic asthma 2014  ? Obesity   ? Pelvic pain   ? Pregnancy, high-risk 1985  ? Stroke [facility]) 04/20/2021  ? (CVA) of right basal ganglia   ? Tonsillitis 1968  ? Uterine fibroid 1980  ? ? ?Past Surgical History:  ?Procedure Laterality Date  ? APPENDECTOMY  1959  ? BASCILIC VEIN TRANSPOSITION Right 04/30/2021  ? Procedure: RIGHT FIRST STAGE [place];  Surgeon: Angelia Mould, MD;  Location: [facility];  Service: Vascular;  Laterality: Right;  ? [place]  ? COLONOSCOPY    ? ESOPHAGOGASTRODUODENOSCOPY (EGD) WITH PROPOFOL N/A 04/18/2021  ? Procedure: ESOPHAGOGASTRODUODENOSCOPY (EGD) WITH PROPOFOL;  Surgeon: Gatha Mayer, MD;  Location: [facility] ENDOSCOPY;  Service: Endoscopy;  Laterality: N/A;  ? EYE SURGERY    ? bilateral cataract   ? FLEXIBLE SIGMOIDOSCOPY N/A 04/18/2021  ? Procedure: FLEXIBLE SIGMOIDOSCOPY;  Surgeon: Gatha Mayer, MD;  Location: [facility] ENDOSCOPY;  Service: Endoscopy;  Laterality: N/A;  ? HERNIA REPAIR  1959  ? IR FLUORO GUIDE CV LINE RIGHT  03/18/2021  ? IR US GUIDE VASC ACCESS RIGHT  03/18/2021  ? LEFT HEART CATH AND CORONARY ANGIOGRAPHY N/A 04/04/2017  ? Procedure: Left Heart Cath and Coronary Angiography;  Surgeon: Belva Crome, MD;  Location: [facility] CV LAB;  Service:  Cardiovascular;  Laterality: N/A;  ? [place], 2004, 2007  ? RHINOPLASTY  1971  ? ROTATOR CUFF REPAIR  2003  ? SURGICAL REPAIR OF HEMORRHAGE  2015  ? TONSILLECTOMY  1968  ? ? ?Vitals:  ? 01/27/22 1336 01/27/22 1341 01/27/22 1350 01/27/22 1354  ?BP: (!) 199/89 (!) 181/91 (!) 195/102 (!) 177/92  ?Pulse: 83 76 78 77  ? ? ? Subjective Assessment - 01/27/22 1340   ? ? Subjective No new complaints. No falls. Continues with usual pain in right foot, hip and wrist.   ? Patient is accompained by: Family member   CNA  ? Pertinent History ESRD with HD M-W-Fri:, obesity, DM type 2, CAD, iron deficiency anemia, bronchial asthma, chronic diastolic CHF, Rt basal ganglia CVA on 04-16-21, steal syndrome RUE due to dialysis vascular access (s/p fistula placement), CKD stage V:  Rt hand weakness; Lt hemiparesis due to CVA   ? Patient Stated Goals "be  independent with mobility and self care"; "be able to drive again"   ? Currently in Pain? Yes   ? Pain Location Generalized   ? Pain Orientation Right   ? Pain Descriptors / Indicators Aching   ? Pain Type Chronic pain   ? Pain Onset More than a month ago   ? Pain Frequency Constant   ? Aggravating Factors  certain movements, use   ? Pain Relieving Factors rest   ? ?  ?  ? ?  ? ? ? ? ? ? ? ? ? ? [facility] Adult PT Treatment/Exercise - 01/27/22 1343   ? ?  ? Transfers  ? Transfers Sit to Stand;Stand to Sit   ? Sit to Stand 5: Supervision   ? Stand to Sit 5: Supervision   ?  ? Ambulation/Gait  ? Ambulation/Gait Yes   ? Ambulation/Gait Assistance 5: Supervision   ? Ambulation/Gait Assistance Details reminder cues for posture and to stay close to walker   ? Ambulation Distance (Feet) 80 Feet   x1  ? Assistive device Rolling walker   ? Gait Pattern Step-through pattern;Decreased hip/knee flexion - left;Decreased step length - left   ? Ambulation Surface Level;Indoor   ?  ? Knee/Hip Exercises: Seated  ? Long Arc Quad AROM;Strengthening;Both;1 set;10 reps;Weights;Limitations   ? Long Arc Quad Weight 2 lbs.   ? Long Pharmacologist LE's with cues for slow movements   ? Other Seated Knee/Hip Exercises seated at edge of mat with 2# ankle weights on: bil stepping out<>back in for 10 reps with limited movement noted on left LE. then had pt steppign out over boom wacker<>back in over boom wacker for 10 reps each side. cues for increased hip flexoin left>right to clear boom wacker surface.   ? ?  ?  ? ?  ? ? ? ? ? ? ? ? ? ? ? ? PT Short Term Goals - 01/25/22 1338   ? ?  ? PT SHORT TERM GOAL #1  ? Title Improve TUG score from 32 secs to </= 27 secs with RW with SBA to demo improved functional mobility. (all STGs due 12-17-21)   ? Baseline 11/18/21: 30.69 sec's with RW, improved from 32 sec's just not to goals;   28.15 secs with RW   ? Status Not Met   ? Target Date 12/17/21   ?  ? PT SHORT TERM GOAL #2  ? Title Pt will  perform 5 times sit to stand transfers from standard chair with only 1 UE  support with SBA to demo increased LE strength and balance. Update:  Perform sit to stand without UE support from mat for 3 reps to demo improved LE strength.   ? Baseline 11/18/21: 17.94 sec's with single UE support from standard height surface with retropulstion noted with each rep, min guard assist for safety; met on 12-21-21 with no LOB upon initial standing   ? Status Achieved   ? Target Date 12/17/21   ?  ? PT SHORT TERM GOAL #3  ? Title Increase Berg balance test score from 13/30 to >/= 19/30 to demo improved balance.  Updated STG; increase score to >/= 38/56   ? Baseline 12/16/21: 40/56 scored today   ? Status Achieved   ?  ? PT SHORT TERM GOAL #4  ? Title Increase gait velocity by at least .4 ft/sec with RW to demo improved gait efficiency. Revised updated LTG:  increase gait velocity to >/= 2.0 ft/sec with RW   ? Baseline 11/02/21: 1.17 ft/sec with RW as baselne:    18.4, 18.82 = 1.74 ft/sec.   17.03 secs = 1.92 ft/sec   ? Time 4   ? Period Weeks   ? Status Not Met   ? Target Date 12/17/21   ?  ? PT SHORT TERM GOAL #5  ? Title Amb. 230' with RW with SBA on flat, even surface for increased community accessibility.   ? Baseline 12/16/21: met in session today   ? Status Achieved   ?  ? PT SHORT TERM GOAL #6  ? Title Independent with HEP for strengthening & balance exs.   ? Baseline 11/18/21: met with current program per pt and CNA   ? Status Achieved   ? Target Date 11/19/21   ? ?  ?  ? ?  ? ? ? ? PT Long Term Goals - 01/25/22 1338   ? ?  ? PT LONG TERM GOAL #1  ? Title Improve TUG score to </= 20 secs with RW with supervision to reduce fall risk.   ? Baseline 32 secs with RW;  29.56 secs with RW on 01-11-22   ? Time 5   ? Period Weeks   ? Status On-going   ? Target Date 02/18/22   ?  ? PT LONG TERM GOAL #2  ? Title Pt will amb. 500' with RW with SBA on flat, even surface for increased community accessibility.   ? Baseline 50' with RW;   230'  with RW on 01-11-22   ? Time 5   ? Period Weeks   ? Status On-going   ? Target Date 02/18/22   ?  ? PT LONG TERM GOAL #3  ? Title Increase Berg balance test score from 13/56 to >/= 28/56 to reduce fal

## 2022-01-28 DIAGNOSIS — N186 End stage renal disease: Secondary | ICD-10-CM | POA: Diagnosis not present

## 2022-01-28 DIAGNOSIS — N2581 Secondary hyperparathyroidism of renal origin: Secondary | ICD-10-CM | POA: Diagnosis not present

## 2022-01-28 DIAGNOSIS — Z992 Dependence on renal dialysis: Secondary | ICD-10-CM | POA: Diagnosis not present

## 2022-01-28 NOTE — Therapy (Signed)
[facility] ?[facility] ?[place], [name], [address] ?Phone: [phone]   Fax:  [phone] ? ?Occupational Therapy Treatment ? ?Patient Details  ?Name: Carolyn Russo ?MRN: [id number] ?Date of Birth: 1951/09/05 ?Referring Provider (OT): Dr. Dagoberto Ligas ? ? ?Encounter Date: 01/27/2022 ? ? OT End of Session - 01/27/22 1404   ? ? Visit Number 20   ? Number of Visits 32   ? Date for OT Re-Evaluation 03/09/22   ? Authorization Type [employer]   ? Authorization Time Period 27 visits 10/24/21 - 02/10/22   ? Authorization - Visit Number 20   ? Authorization - Number of Visits 27   ? Progress Note Due on Visit 21   ? OT Start Time 1402   ? OT Stop Time 1430   ? OT Time Calculation (min) 28 min   ? Activity Tolerance Patient tolerated treatment well;Patient limited by fatigue   ? Behavior During Therapy [facility] for tasks assessed/performed   ? ?  ?  ? ?  ? ? ?Past Medical History:  ?Diagnosis Date  ? Acute GI bleeding   ? Allergy   ? Anemia   ? Anterior chest wall pain   ? Appendicitis 1965  ? Asthma   ? Body mass index 37.0-37.9, adult   ? Breast pain   ? Cataract   ? both eyes  ? CHF (congestive heart failure) ([place])   ? Chronic kidney disease   ? stage 5 - on dialysis  ? Cognitive change 04/20/2021  ? r/t cva 03/2821  ? Dehydration 2014  ? Deviated septum 1971  ? Diabetes mellitus   ? no meds  ? Dyspnea 2014  ? Extrinsic asthma   ? WITH ASTHMA ATTACK  ? Fibroid 1980  ? GERD (gastroesophageal reflux disease)   ? Heart murmur   ? Hx gestational diabetes   ? Hyperlipidemia   ? Hypertension 2014  ? Inguinal hernia 1959  ? Malaise and fatigue 2014  ? Non-IgE mediated allergic asthma 2014  ? Obesity   ? Pelvic pain   ? Pregnancy, high-risk 1985  ? Stroke [facility]) 04/20/2021  ? (CVA) of right basal ganglia  ? Tonsillitis 1968  ? Uterine fibroid 1980  ? ? ?Past Surgical History:  ?Procedure Laterality Date  ? APPENDECTOMY  1959  ? BASCILIC VEIN TRANSPOSITION Right 04/30/2021  ? Procedure:  RIGHT FIRST STAGE [place];  Surgeon: Angelia Mould, MD;  Location: [facility];  Service: Vascular;  Laterality: Right;  ? [place]  ? COLONOSCOPY    ? ESOPHAGOGASTRODUODENOSCOPY (EGD) WITH PROPOFOL N/A 04/18/2021  ? Procedure: ESOPHAGOGASTRODUODENOSCOPY (EGD) WITH PROPOFOL;  Surgeon: Gatha Mayer, MD;  Location: [facility] ENDOSCOPY;  Service: Endoscopy;  Laterality: N/A;  ? EYE SURGERY    ? bilateral cataract   ? FLEXIBLE SIGMOIDOSCOPY N/A 04/18/2021  ? Procedure: FLEXIBLE SIGMOIDOSCOPY;  Surgeon: Gatha Mayer, MD;  Location: [facility] ENDOSCOPY;  Service: Endoscopy;  Laterality: N/A;  ? HERNIA REPAIR  1959  ? IR FLUORO GUIDE CV LINE RIGHT  03/18/2021  ? IR US GUIDE VASC ACCESS RIGHT  03/18/2021  ? LEFT HEART CATH AND CORONARY ANGIOGRAPHY N/A 04/04/2017  ? Procedure: Left Heart Cath and Coronary Angiography;  Surgeon: Belva Crome, MD;  Location: [place] [facility] CV LAB;  Service: Cardiovascular;  Laterality: N/A;  ? [place], 2004, 2007  ? RHINOPLASTY  1971  ? ROTATOR CUFF REPAIR  2003  ? SURGICAL REPAIR OF HEMORRHAGE  2015  ?  TONSILLECTOMY  1968  ? ? ?There were no vitals filed for this visit. ? ? Subjective Assessment - 01/27/22 1403   ? ? Subjective  mild wrist and hand pain   ? Pertinent History [name] is a 71 y.o. female with PMHx of obesity, DM, HTN, HLD, ESRD on HD who presented with left sided weakness for 3 days on 04/16/2021. Pt was diagnosed with R basal ganglia infarct Pt went to rehab but then transferred back to acute service on 7/13 for likely seizure. Pt returned to CIR and was d/c home on 05/27/21. Pt received HHOT and PT. Pt has R hand weakness s/p fistula placement. PMH: diabetes, hypertension, hyperlipidemia, ESRD on HD MWF.   ? Patient Stated Goals to be like I was prior to my stroke   ? Currently in Pain? Yes   ? Pain Score 4    ? Pain Location Hand   wrist  ? Pain Orientation Right   ? Pain Descriptors / Indicators Aching   ? Pain Type Chronic pain   ?  Pain Onset More than a month ago   ? Pain Frequency Constant   ? Aggravating Factors  malpostioning   ? Pain Relieving Factors cream   ? ?  ?  ? ?  ? ? ? ? ? ? ? ? ? ? ? ? ? ? ?Treatment: placing and removing wooden dowel pegs form pegboard with RUE, min-mod v.c for hand positioning and avoiding compensation.  ?Flipping playing cards and picking up checkers with RUE, min v.c and mod difficulty ?Folding towels and washcloths with both UE's min difficulty, v.c ? ? ? ? ? ? ? ? OT Education - 01/28/22 0742   ? ? Education Details Functional activities to perfrom with left then both hands   ? Person(s) Educated Associate Professor)   ? Methods Explanation;Demonstration;Verbal cues;Handout   ? Comprehension Verbalized understanding;Returned demonstration;Verbal cues required   ? ?  ?  ? ?  ? ? ? OT Short Term Goals - 01/28/22 0746   ? ?  ? OT SHORT TERM GOAL #1  ? Title I with initial HEP   ? Time 4   ? Period Weeks   ? Status Achieved   ? Target Date 11/18/21   ?  ? OT SHORT TERM GOAL #2  ? Title Pt will donn shirt  and bathe UB with min A consistently   ? Time 4   ? Period Weeks   ? Status Achieved   met pt donns shirt without assistance 3 days per week, and she reports assisting with bathing  ?  ? OT SHORT TERM GOAL #3  ? Title Pt will be I with bilateral UE positioning including splinting  to minimize pain and risk for contracture and injury.   ? Time 4   ? Period Weeks   ? Status Achieved   met, pt verbalizes understanding  ?  ? OT SHORT TERM GOAL #4  ? Title Pt will demonstrate 60* LUE shoulder flexion in prep for functional reach   ? Time 4   ? Period Weeks   ? Status Achieved   60*  ?  ? OT SHORT TERM GOAL #5  ? Title Pt will demonstrate improved bilateral UE functional use as evidenced by increasing bilateral box/ blocks score by 3 blocks.   ? Time 4   ? Period Weeks   ? Status Achieved   R 20, L 30  ?  ? OT SHORT TERM GOAL #  6  ? Title Pt will demonstrate improved bilateral UE functional use as evidenced by  increasing bilateral box/ blocks score to 23 blocks for RUE and 33 for LUE   ? Baseline R 20, L 30 at renewal   ? Time 4   ? Period Weeks   ? Status On-going   ?  ? OT SHORT TERM GOAL #7  ? Title Revised goal -Pt will demonstrate 65* LUE shoulder flexion in prep for functional reach   ? Baseline 60* at renewal   ? Time 4   ? Period Weeks   ? Status On-going   ?  ? OT SHORT TERM GOAL #8  ? Title Pt will consistently donn pants with no more than min A   ? Time 4   ? Period Weeks   ? Status On-going   ?  ? OT SHORT TERM GOAL  #9  ? TITLE Pt/ caregiver will verbalize understanding of adpted strategies to increase pt's safety and I with ADLs/ IADLs.   ? Time 4   ? Period Weeks   ? Status On-going   ? ?  ?  ? ?  ? ? ? ? OT Long Term Goals - 01/27/22 1416   ? ?  ? OT LONG TERM GOAL #1  ? Title Pt will perform UB dressing with setup and LB dressing with min A.   ? Period Weeks   ? Status Partially Met   pt donns shirt following set up  3 days per week, not consistent for LB min-mod A  ?  ? OT LONG TERM GOAL #2  ? Title Pt will bathe herself with min A.   ? Period Weeks   ? Status Achieved   Pt reports bathing with min A  ?  ? OT LONG TERM GOAL #3  ? Title Pt will perform simple home management/ cooking tasks with min A.   ? Time 8   ? Period Weeks   ? Status On-going   folding laundry  ?  ? OT LONG TERM GOAL #4  ? Title Pt will retrieve a lightweight object at 75* shoulder flexion with LUE Revised goal-.Pt will retrieve a lightweight object at 70* shoulder flexion with LUE.   ? Time 8   ? Period Weeks   ? Status Revised   01/11/22-60*  ?  ? OT LONG TERM GOAL #5  ? Title Pt will increase RUE grip strength to 15 lbs for increased functional use.   ? Time 8   ? Period Weeks   ? Status On-going   9.9 lbs  ?  ? OT LONG TERM GOAL #6  ? Title Pt will demonstrate ability to write her name and address with 100% legibility using AE prn.   ? Time 8   ? Period Weeks   ? Status On-going   not consistent-01/11/22  ?  ? OT LONG TERM GOAL #7   ? Title Pt will utilize RUE at least 75% of the time for ADLS/ IADLS with pain no greater than 4/10.   ? Time 8   ? Period Weeks   ? Status On-going   uses RUE grossly 55% of the time  ? ?  ?  ? ?  ? ? ? ? ?

## 2022-01-31 ENCOUNTER — Telehealth: Payer: Self-pay

## 2022-01-31 ENCOUNTER — Ambulatory Visit: Payer: Medicare PPO | Admitting: Podiatry

## 2022-01-31 DIAGNOSIS — Z992 Dependence on renal dialysis: Secondary | ICD-10-CM | POA: Diagnosis not present

## 2022-01-31 DIAGNOSIS — N2581 Secondary hyperparathyroidism of renal origin: Secondary | ICD-10-CM | POA: Diagnosis not present

## 2022-01-31 DIAGNOSIS — N186 End stage renal disease: Secondary | ICD-10-CM | POA: Diagnosis not present

## 2022-01-31 DIAGNOSIS — E1165 Type 2 diabetes mellitus with hyperglycemia: Secondary | ICD-10-CM | POA: Diagnosis not present

## 2022-01-31 NOTE — Telephone Encounter (Signed)
Dr. [name] called: (Caller aware Dr. Dagoberto Ligas is not the office for the next 3 days) ? ?Carolyn Russo missed her last appointment because of [employer] transportation. Her next appointment is on 04/18/2022 here at [place].  Which is after her final physical therapy & occupational therapy evaluation. Dr. [name] is concern because you wanted to see her back for follow up.  Before the therapies ended.  ? ?She has also developed an ulcer on the right greater toe. The Podiatrist has ordered a scan to check for blood flow.  ? ?Dr. [name] has been advised the therapist will most likely call for extension if needed, for therapy. Dr. Dagoberto Ligas does not have any sooner appointments. First available is in July. She has been placed on a wait list and Carolyn Russo will be notified. ? ?Call back phone [phone] ? ? ?

## 2022-02-01 ENCOUNTER — Ambulatory Visit: Payer: Medicare PPO | Admitting: Occupational Therapy

## 2022-02-01 ENCOUNTER — Ambulatory Visit (INDEPENDENT_AMBULATORY_CARE_PROVIDER_SITE_OTHER): Payer: Medicare PPO | Admitting: Podiatry

## 2022-02-01 DIAGNOSIS — L97511 Non-pressure chronic ulcer of other part of right foot limited to breakdown of skin: Secondary | ICD-10-CM | POA: Diagnosis not present

## 2022-02-01 DIAGNOSIS — I739 Peripheral vascular disease, unspecified: Secondary | ICD-10-CM | POA: Diagnosis not present

## 2022-02-01 MED ORDER — MUPIROCIN 2 % EX OINT: 1.0000 "application " | TOPICAL_OINTMENT | Freq: Two times a day (BID) | CUTANEOUS | 2 refills | Status: DC

## 2022-02-01 NOTE — Patient Instructions (Signed)
Call [phone]  to schedule your vascular appointment  ?

## 2022-02-02 DIAGNOSIS — N186 End stage renal disease: Secondary | ICD-10-CM | POA: Diagnosis not present

## 2022-02-02 DIAGNOSIS — N2581 Secondary hyperparathyroidism of renal origin: Secondary | ICD-10-CM | POA: Diagnosis not present

## 2022-02-02 DIAGNOSIS — Z992 Dependence on renal dialysis: Secondary | ICD-10-CM | POA: Diagnosis not present

## 2022-02-03 ENCOUNTER — Ambulatory Visit: Payer: Medicare PPO | Admitting: Occupational Therapy

## 2022-02-03 ENCOUNTER — Encounter: Payer: Self-pay | Admitting: Occupational Therapy

## 2022-02-03 DIAGNOSIS — R2681 Unsteadiness on feet: Secondary | ICD-10-CM

## 2022-02-03 DIAGNOSIS — R278 Other lack of coordination: Secondary | ICD-10-CM

## 2022-02-03 DIAGNOSIS — I69354 Hemiplegia and hemiparesis following cerebral infarction affecting left non-dominant side: Secondary | ICD-10-CM

## 2022-02-03 DIAGNOSIS — M25512 Pain in left shoulder: Secondary | ICD-10-CM | POA: Diagnosis not present

## 2022-02-03 DIAGNOSIS — R208 Other disturbances of skin sensation: Secondary | ICD-10-CM | POA: Diagnosis not present

## 2022-02-03 DIAGNOSIS — M79641 Pain in right hand: Secondary | ICD-10-CM

## 2022-02-03 DIAGNOSIS — R2689 Other abnormalities of gait and mobility: Secondary | ICD-10-CM | POA: Diagnosis not present

## 2022-02-03 DIAGNOSIS — M6281 Muscle weakness (generalized): Secondary | ICD-10-CM

## 2022-02-03 NOTE — Patient Instructions (Signed)
Use a highlighter or marker to print the alphabet ? ?Write large, slow down and make sure to move your hand for spacing ? ?This will help improve your hand writing ?

## 2022-02-03 NOTE — Therapy (Signed)
[facility] ?[place] ?[place], [name], [address] ?Phone: [phone]   Fax:  [phone] ? ?Occupational Therapy Treatment ? ?Patient Details  ?Name: Carolyn Russo ?MRN: [id number] ?Date of Birth: 04-03-1951 ?Referring Provider (OT): Dr. Dagoberto Ligas ? ? ?Encounter Date: 02/03/2022 ? ? OT End of Session - 02/03/22 1028   ? ? Visit Number 21   ? Number of Visits 32   ? Date for OT Re-Evaluation 03/09/22   ? Authorization Type [employer]   ? Authorization Time Period 27 visits 10/24/21 - 02/10/22   ? Authorization - Visit Number 21   ? Authorization - Number of Visits 27   ? OT Start Time 1024   ? OT Stop Time 1105   ? OT Time Calculation (min) 41 min   ? Activity Tolerance Patient tolerated treatment well;Patient limited by fatigue   ? Behavior During Therapy [facility] for tasks assessed/performed   ? ?  ?  ? ?  ? ? ?Past Medical History:  ?Diagnosis Date  ? Acute GI bleeding   ? Allergy   ? Anemia   ? Anterior chest wall pain   ? Appendicitis 1965  ? Asthma   ? Body mass index 37.0-37.9, adult   ? Breast pain   ? Cataract   ? both eyes  ? CHF (congestive heart failure) ([place])   ? Chronic kidney disease   ? stage 5 - on dialysis  ? Cognitive change 04/20/2021  ? r/t cva 03/2821  ? Dehydration 2014  ? Deviated septum 1971  ? Diabetes mellitus   ? no meds  ? Dyspnea 2014  ? Extrinsic asthma   ? WITH ASTHMA ATTACK  ? Fibroid 1980  ? GERD (gastroesophageal reflux disease)   ? Heart murmur   ? Hx gestational diabetes   ? Hyperlipidemia   ? Hypertension 2014  ? Inguinal hernia 1959  ? Malaise and fatigue 2014  ? Non-IgE mediated allergic asthma 2014  ? Obesity   ? Pelvic pain   ? Pregnancy, high-risk 1985  ? Stroke [facility]) 04/20/2021  ? (CVA) of right basal ganglia  ? Tonsillitis 1968  ? Uterine fibroid 1980  ? ? ?Past Surgical History:  ?Procedure Laterality Date  ? APPENDECTOMY  1959  ? BASCILIC VEIN TRANSPOSITION Right 04/30/2021  ? Procedure: RIGHT FIRST STAGE [place];  Surgeon: Angelia Mould, MD;  Location: [place];  Service: Vascular;  Laterality: Right;  ? [place]  ? COLONOSCOPY    ? ESOPHAGOGASTRODUODENOSCOPY (EGD) WITH PROPOFOL N/A 04/18/2021  ? Procedure: ESOPHAGOGASTRODUODENOSCOPY (EGD) WITH PROPOFOL;  Surgeon: Gatha Mayer, MD;  Location: [facility] ENDOSCOPY;  Service: Endoscopy;  Laterality: N/A;  ? EYE SURGERY    ? bilateral cataract   ? FLEXIBLE SIGMOIDOSCOPY N/A 04/18/2021  ? Procedure: FLEXIBLE SIGMOIDOSCOPY;  Surgeon: Gatha Mayer, MD;  Location: [facility] ENDOSCOPY;  Service: Endoscopy;  Laterality: N/A;  ? HERNIA REPAIR  1959  ? IR FLUORO GUIDE CV LINE RIGHT  03/18/2021  ? IR US GUIDE VASC ACCESS RIGHT  03/18/2021  ? LEFT HEART CATH AND CORONARY ANGIOGRAPHY N/A 04/04/2017  ? Procedure: Left Heart Cath and Coronary Angiography;  Surgeon: Belva Crome, MD;  Location: [facility] CV LAB;  Service: Cardiovascular;  Laterality: N/A;  ? [place], 2004, 2007  ? RHINOPLASTY  1971  ? ROTATOR CUFF REPAIR  2003  ? SURGICAL REPAIR OF HEMORRHAGE  2015  ? TONSILLECTOMY  1968  ? ? ?There  were no vitals filed for this visit. ? ? Subjective Assessment - 02/03/22 1028   ? ? Pertinent History [name] is a 71 y.o. female with PMHx of obesity, DM, HTN, HLD, ESRD on HD who presented with left sided weakness for 3 days on 04/16/2021. Pt was diagnosed with R basal ganglia infarct Pt went to rehab but then transferred back to acute service on 7/13 for likely seizure. Pt returned to CIR and was d/c home on 05/27/21. Pt received HHOT and PT. Pt has R hand weakness s/p fistula placement. PMH: diabetes, hypertension, hyperlipidemia, ESRD on HD MWF.   ? Patient Stated Goals to be like I was prior to my stroke   ? Currently in Pain? Yes   ? Pain Score 6    ? Pain Location Hand   ? Pain Orientation Right   ? Pain Descriptors / Indicators Aching   ? Pain Type Chronic pain   ? Pain Onset More than a month ago   ? Pain Frequency Constant   ? Aggravating  Factors  malpositioning   ? Pain Relieving Factors ream   ? ?  ?  ? ?  ? ? ? ? ?Treatment:Low range functional reaching to place pegs in wooden dowel pegboard with right and left UE's , min difficulty/ v.c ? ?Donning shorts in seated then standing to pull up pants with supervision and min v.c, pt doffed with supervision and min v.c . Pt has been encouraged to perform more at home. ? ?Printing and writing using foam grip, and highlighter. Pt  demonstrates ability to print legibly with highlighter , min v.c for techniques and hand position. ?Pt asked about ambulating with a cane. Therapist recommends that pt uses walker for safety and she discusses use of cane with PT at her next visit. ? ? ? ? ? ? ? ? ? ? ? ? ? ? ? ? ? ? ? ? ? OT Short Term Goals - 01/28/22 0746   ? ?  ? OT SHORT TERM GOAL #1  ? Title I with initial HEP   ? Time 4   ? Period Weeks   ? Status Achieved   ? Target Date 11/18/21   ?  ? OT SHORT TERM GOAL #2  ? Title Pt will donn shirt  and bathe UB with min A consistently   ? Time 4   ? Period Weeks   ? Status Achieved   met pt donns shirt without assistance 3 days per week, and she reports assisting with bathing  ?  ? OT SHORT TERM GOAL #3  ? Title Pt will be I with bilateral UE positioning including splinting  to minimize pain and risk for contracture and injury.   ? Time 4   ? Period Weeks   ? Status Achieved   met, pt verbalizes understanding  ?  ? OT SHORT TERM GOAL #4  ? Title Pt will demonstrate 60* LUE shoulder flexion in prep for functional reach   ? Time 4   ? Period Weeks   ? Status Achieved   60*  ?  ? OT SHORT TERM GOAL #5  ? Title Pt will demonstrate improved bilateral UE functional use as evidenced by increasing bilateral box/ blocks score by 3 blocks.   ? Time 4   ? Period Weeks   ? Status Achieved   R 20, L 30  ?  ? OT SHORT TERM GOAL #6  ? Title Pt will demonstrate improved  bilateral UE functional use as evidenced by increasing bilateral box/ blocks score to 23 blocks for RUE and 33 for  LUE   ? Baseline R 20, L 30 at renewal   ? Time 4   ? Period Weeks   ? Status On-going   ?  ? OT SHORT TERM GOAL #7  ? Title Revised goal -Pt will demonstrate 65* LUE shoulder flexion in prep for functional reach   ? Baseline 60* at renewal   ? Time 4   ? Period Weeks   ? Status On-going   ?  ? OT SHORT TERM GOAL #8  ? Title Pt will consistently donn pants with no more than min A   ? Time 4   ? Period Weeks   ? Status On-going   ?  ? OT SHORT TERM GOAL  #9  ? TITLE Pt/ caregiver will verbalize understanding of adpted strategies to increase pt's safety and I with ADLs/ IADLs.   ? Time 4   ? Period Weeks   ? Status On-going   ? ?  ?  ? ?  ? ? ? ? OT Long Term Goals - 02/03/22 1042   ? ?  ? OT LONG TERM GOAL #1  ? Title Pt will perform UB dressing with setup and LB dressing with min A.   ? Period Weeks   ? Status Partially Met   pt donns shirt following set up  3 days per week, not consistent for LB min-mod A  ?  ? OT LONG TERM GOAL #2  ? Title Pt will bathe herself with min A.   ? Period Weeks   ? Status Achieved   Pt reports bathing with min A  ?  ? OT LONG TERM GOAL #3  ? Title Pt will perform simple home management/ cooking tasks with min A.   ? Time 8   ? Period Weeks   ? Status On-going   folding laundry, pt reports assisting with cooking mod A  ?  ? OT LONG TERM GOAL #4  ? Title Pt will retrieve a lightweight object at 75* shoulder flexion with LUE Revised goal-.Pt will retrieve a lightweight object at 70* shoulder flexion with LUE.   ? Time 8   ? Period Weeks   ? Status Revised   01/11/22-60*  ?  ? OT LONG TERM GOAL #5  ? Title Pt will increase RUE grip strength to 15 lbs for increased functional use.   ? Time 8   ? Period Weeks   ? Status On-going   9.9 lbs  ?  ? OT LONG TERM GOAL #6  ? Title Pt will demonstrate ability to write her name and address with 100% legibility using AE prn.   ? Time 8   ? Period Weeks   ? Status On-going   not consistent-01/11/22  ?  ? OT LONG TERM GOAL #7  ? Title Pt will utilize RUE  at least 75% of the time for ADLS/ IADLS with pain no greater than 4/10.   ? Time 8   ? Period Weeks   ? Status On-going   uses RUE grossly 55% of the time  ? ?  ?  ? ?  ? ? ? ? ? ? ? ? Plan - 02/03/22 1201

## 2022-02-04 DIAGNOSIS — N186 End stage renal disease: Secondary | ICD-10-CM | POA: Diagnosis not present

## 2022-02-04 DIAGNOSIS — Z992 Dependence on renal dialysis: Secondary | ICD-10-CM | POA: Diagnosis not present

## 2022-02-04 DIAGNOSIS — N2581 Secondary hyperparathyroidism of renal origin: Secondary | ICD-10-CM | POA: Diagnosis not present

## 2022-02-05 NOTE — Progress Notes (Signed)
?Subjective:  ?Patient ID: Carolyn Russo, female    DOB: 28-Jun-1951,  MRN: [id number] ? ?Chief Complaint  ?Patient presents with  ? Foot Ulcer  ?  Right great toe  ? ? ?71 y.o. female presents with the above complaint. History confirmed with patient.  She presents swelling of her right great toe there is a small blister they noticed after traveling and wearing a different type of shoe ? ?Interval history: ?She returns for follow-up she completed the wound care testing, she is using the mupirocin ointment and has completed the Keflex course that I sent her. ? ?Objective:  ?Physical Exam: ?Foot is warm and the ankles to cool at the toes, nonpalpable pedal pulses, there is dark discoloration of the hallux with a small scab at the distal tip, no exposed bone or subcutaneous tissue, seems to be limited to breakdown of skin, no cellulitis or purulence.  Appears dysvascular.  There is also deep tissue injury on the lateral fifth toe ? ?ABI Findings:  ?+---------+------------------+-----+----------+--------+  ?Right    Rt Pressure (mmHg)IndexWaveform  Comment   ?+---------+------------------+-----+----------+--------+  ?Brachial 192                                        ?+---------+------------------+-----+----------+--------+  ?PTA      191               0.99 monophasic          ?+---------+------------------+-----+----------+--------+  ?PERO     191               0.99 monophasic          ?+---------+------------------+-----+----------+--------+  ?DP       178               0.93 monophasic          ?+---------+------------------+-----+----------+--------+  ?Great Toe36                0.19 Abnormal            ?+---------+------------------+-----+----------+--------+  ? ?+---------+------------------+-----+----------+-------+  ?Left     Lt Pressure (mmHg)IndexWaveform  Comment  ?+---------+------------------+-----+----------+-------+  ?Brachial 183                                        ?+---------+------------------+-----+----------+-------+  ?PTA                             monophasic         ?+---------+------------------+-----+----------+-------+  ?PERO                            monophasic         ?+---------+------------------+-----+----------+-------+  ?DP                              monophasic         ?+---------+------------------+-----+----------+-------+  ?Great Toe102               0.53 Abnormal           ?+---------+------------------+-----+----------+-------+  ? ?+-------+----------------+-----------+------------+------------+  ?ABI/TBIToday's ABI     Today's TBIPrevious ABIPrevious TBI  ?+-------+----------------+-----------+------------+------------+  ?Right  .99 (unreliable).19                                  ?+-------+----------------+-----------+------------+------------+  ?  Left   unattainable    .53                                  ?+-------+----------------+-----------+------------+------------+  ? ?   ? ? ?TOES Findings:  ?+----------+---------------+--------+-------------------------+  ?Right ToesPressure (mmHg)WaveformComment                    ?+----------+---------------+--------+-------------------------+  ?1st Digit                AbnormalSevere, nearly flat lined  ?+----------+---------------+--------+-------------------------+  ?2nd Digit                AbnormalSevere, nearly flat lined  ?+----------+---------------+--------+-------------------------+  ?3rd Digit                AbnormalModerate                   ?+----------+---------------+--------+-------------------------+  ?4th Digit                AbnormalModerate to Severe         ?+----------+---------------+--------+-------------------------+  ?5th Digit                AbnormalSevere, absent             ?+----------+---------------+--------+-------------------------+  ? ?    ? ?+---------+---------------+--------+----------------+  ?Left ToesPressure (mmHg)WaveformComment           ?+---------+---------------+--------+----------------+  ?1st Digit               AbnormalMild to Moderate  ?+---------+---------------+--------+----------------+  ?2nd Digit               AbnormalMild              ?+---------+---------------+--------+----------------+  ?3rd Digit               AbnormalMild              ?+---------+---------------+--------+----------------+  ?4th Digit               AbnormalMild              ?+---------+---------------+--------+----------------+  ?5th Digit               AbnormalMild to Moderate  ?+---------+---------------+--------+----------------+  ? ?   ? ?   ?Arterial wall calcification precludes accurate ankle pressures and ABIs of  ?the right lower extremity.  ? ?Unable to obtain accurate pressure readings in the left lower extremity  ?due to involuntary foot movements.  ?   ?Summary:  ?Right:  ?Although ankle brachial indices are within normal limits (0.95-1.29),  ?arterial Doppler waveforms at the ankle suggest some component of arterial  ?occlusive disease.  ?Left:  ? ?Abnormal monophasic waveforms.  ? ?Unable to obtain accurate pressure readings in the left lower extremity  ?due to involuntary foot movements.  ?Assessment:  ? ?1. Skin ulcer of right great toe, limited to breakdown of skin ([place])   ?2. Peripheral arterial disease ([place])   ? ? ? ?Plan:  ?Patient was evaluated and treated and all questions answered. ? ?Today fortunately she has no worsening skin breakdown of the hallux, she does have new deep tissue injury on the fifth toe.  I recommend she continue using the mupirocin ointment.  Discussed with her the results of her vascular testing and she has severe vascular disease at the level of the toes with a toe pressure of 36 of the right great toe.  I have referred her  to vascular surgery for evaluation.  Expect she will need  angiography.  I also prescribed additional mupirocin.  I will see her back in 1 month for reevaluation.  We discussed signs and symptoms of worsening and infection and to present to the ER if this develops. ? ?Return in about 1 month (around 03/03/2022).  ? ?

## 2022-02-07 ENCOUNTER — Other Ambulatory Visit: Payer: Self-pay | Admitting: Internal Medicine

## 2022-02-07 DIAGNOSIS — Z992 Dependence on renal dialysis: Secondary | ICD-10-CM | POA: Diagnosis not present

## 2022-02-07 DIAGNOSIS — N2581 Secondary hyperparathyroidism of renal origin: Secondary | ICD-10-CM | POA: Diagnosis not present

## 2022-02-07 DIAGNOSIS — N186 End stage renal disease: Secondary | ICD-10-CM | POA: Diagnosis not present

## 2022-02-07 MED ORDER — PANTOPRAZOLE SODIUM 40 MG PO TBEC: 40.0000 mg | DELAYED_RELEASE_TABLET | Freq: Every day | ORAL | 2 refills | Status: DC

## 2022-02-08 ENCOUNTER — Encounter: Payer: Self-pay | Admitting: Occupational Therapy

## 2022-02-08 ENCOUNTER — Ambulatory Visit: Payer: Medicare PPO | Admitting: Occupational Therapy

## 2022-02-08 ENCOUNTER — Ambulatory Visit: Payer: Medicare PPO | Admitting: Physical Therapy

## 2022-02-08 VITALS — BP 188/89

## 2022-02-08 DIAGNOSIS — R2681 Unsteadiness on feet: Secondary | ICD-10-CM | POA: Diagnosis not present

## 2022-02-08 DIAGNOSIS — M25512 Pain in left shoulder: Secondary | ICD-10-CM | POA: Diagnosis not present

## 2022-02-08 DIAGNOSIS — M6281 Muscle weakness (generalized): Secondary | ICD-10-CM

## 2022-02-08 DIAGNOSIS — R278 Other lack of coordination: Secondary | ICD-10-CM

## 2022-02-08 DIAGNOSIS — R2689 Other abnormalities of gait and mobility: Secondary | ICD-10-CM | POA: Diagnosis not present

## 2022-02-08 DIAGNOSIS — M79641 Pain in right hand: Secondary | ICD-10-CM

## 2022-02-08 DIAGNOSIS — I69354 Hemiplegia and hemiparesis following cerebral infarction affecting left non-dominant side: Secondary | ICD-10-CM | POA: Diagnosis not present

## 2022-02-08 DIAGNOSIS — R208 Other disturbances of skin sensation: Secondary | ICD-10-CM | POA: Diagnosis not present

## 2022-02-08 NOTE — Therapy (Signed)
[facility] ?[place] ?[place], [name], [address] ?Phone: [phone]   Fax:  [phone] ? ?Occupational Therapy Treatment ? ?Patient Details  ?Name: Carolyn Russo ?MRN: [id number] ?Date of Birth: [birth year] ?Referring Provider (OT): Dr. Dagoberto Ligas ? ? ?Encounter Date: 02/08/2022 ? ? OT End of Session - 02/08/22 1105   ? ? Visit Number 22   ? Number of Visits 32   ? Date for OT Re-Evaluation 03/09/22   ? Authorization Type [employer]   ? Authorization Time Period 27 visits 10/24/21 - 02/10/22   ? Authorization - Visit Number 22   ? Authorization - Number of Visits 27   ? OT Start Time 1107   ? OT Stop Time 1145   ? OT Time Calculation (min) 38 min   ? Activity Tolerance Patient tolerated treatment well;Patient limited by fatigue   ? Behavior During Therapy [facility] for tasks assessed/performed   ? ?  ?  ? ?  ? ? ?Past Medical History:  ?Diagnosis Date  ? Acute GI bleeding   ? Allergy   ? Anemia   ? Anterior chest wall pain   ? Appendicitis 1965  ? Asthma   ? Body mass index 37.0-37.9, adult   ? Breast pain   ? Cataract   ? both eyes  ? CHF (congestive heart failure) ([place])   ? Chronic kidney disease   ? stage 5 - on dialysis  ? Cognitive change 04/20/2021  ? r/t cva 03/2821  ? Dehydration 2014  ? Deviated septum 1971  ? Diabetes mellitus   ? no meds  ? Dyspnea 2014  ? Extrinsic asthma   ? WITH ASTHMA ATTACK  ? Fibroid 1980  ? GERD (gastroesophageal reflux disease)   ? Heart murmur   ? Hx gestational diabetes   ? Hyperlipidemia   ? Hypertension 2014  ? Inguinal hernia 1959  ? Malaise and fatigue 2014  ? Non-IgE mediated allergic asthma 2014  ? Obesity   ? Pelvic pain   ? Pregnancy, high-risk 1985  ? Stroke [facility]) 04/20/2021  ? (CVA) of right basal ganglia  ? Tonsillitis 1968  ? Uterine fibroid 1980  ? ? ?Past Surgical History:  ?Procedure Laterality Date  ? APPENDECTOMY  1959  ? BASCILIC VEIN TRANSPOSITION Right 04/30/2021  ? Procedure: RIGHT FIRST STAGE [place];  Surgeon: Angelia Mould, MD;  Location: [place];  Service: Vascular;  Laterality: Right;  ? [place]  ? COLONOSCOPY    ? ESOPHAGOGASTRODUODENOSCOPY (EGD) WITH PROPOFOL N/A 04/18/2021  ? Procedure: ESOPHAGOGASTRODUODENOSCOPY (EGD) WITH PROPOFOL;  Surgeon: Gatha Mayer, MD;  Location[facility] ENDOSCOPY;  Service: Endoscopy;  Laterality: N/A;  ? EYE SURGERY    ? bilateral cataract   ? FLEXIBLE SIGMOIDOSCOPY N/A 04/18/2021  ? Procedure: FLEXIBLE SIGMOIDOSCOPY;  Surgeon: Gatha Mayer, MD;  Location: [facility] ENDOSCOPY;  Service: Endoscopy;  Laterality: N/A;  ? HERNIA REPAIR  1959  ? IR FLUORO GUIDE CV LINE RIGHT  03/18/2021  ? IR US GUIDE VASC ACCESS RIGHT  03/18/2021  ? LEFT HEART CATH AND CORONARY ANGIOGRAPHY N/A 04/04/2017  ? Procedure: Left Heart Cath and Coronary Angiography;  Surgeon: Belva Crome, MD;  Location: [place] CV LAB;  Service: Cardiovascular;  Laterality: N/A;  ? [place], 2004, 2007  ? RHINOPLASTY  1971  ? ROTATOR CUFF REPAIR  2003  ? SURGICAL REPAIR OF HEMORRHAGE  2015  ? TONSILLECTOMY  1968  ? ? ?Vitals:  ?  02/08/22 1108  ?BP: (!) 188/89  ? ? ? Subjective Assessment - 02/08/22 1108   ? ? Subjective  mild wrist and hand pain   ? Pertinent History [name] is a 71 y.o. female with PMHx of obesity, DM, HTN, HLD, ESRD on HD who presented with left sided weakness for 3 days on 04/16/2021. Pt was diagnosed with R basal ganglia infarct Pt went to rehab but then transferred back to acute service on 7/13 for likely seizure. Pt returned to CIR and was d/c home on 05/27/21. Pt received HHOT and PT. Pt has R hand weakness s/p fistula placement. PMH: diabetes, hypertension, hyperlipidemia, ESRD on HD MWF.   ? Patient Stated Goals to be like I was prior to my stroke   ? Currently in Pain? Yes   ? Pain Score 4    ? Pain Location Hand   ? Pain Orientation Right   ? Pain Descriptors / Indicators Aching   ? Pain Type Chronic pain   ? Pain Onset More than a month ago   ?  Pain Frequency Constant   ? Aggravating Factors  malpositioning   ? Pain Relieving Factors repositioning   ? ?  ?  ? ?  ? ? ? ? ? ? ? ?Treatment: Pt's BP was 188/89 today. Low intensity Activities performed at table. ?Therapist started checking progress towards goals in anticipation of d/c next week. ?Functional reaching to place large  to small pegs into pegboard with left and right UE's, mod difficulty with RUE, and min v.c for positioning, increased time required. ? ? ? ? ? ? ? ? ? ? ? ? ? ? ? ? ? OT Short Term Goals - 02/08/22 1114   ? ?  ? OT SHORT TERM GOAL #1  ? Title I with initial HEP   ? Time 4   ? Period Weeks   ? Status Achieved   ? Target Date 11/18/21   ?  ? OT SHORT TERM GOAL #2  ? Title Pt will donn shirt  and bathe UB with min A consistently   ? Time 4   ? Period Weeks   ? Status Achieved   met pt donns shirt without assistance 3 days per week, and she reports assisting with bathing  ?  ? OT SHORT TERM GOAL #3  ? Title Pt will be I with bilateral UE positioning including splinting  to minimize pain and risk for contracture and injury.   ? Time 4   ? Period Weeks   ? Status Achieved   met, pt verbalizes understanding  ?  ? OT SHORT TERM GOAL #4  ? Title Pt will demonstrate 60* LUE shoulder flexion in prep for functional reach   ? Time 4   ? Period Weeks   ? Status Achieved   60*  ?  ? OT SHORT TERM GOAL #5  ? Title Pt will demonstrate improved bilateral UE functional use as evidenced by increasing bilateral box/ blocks score by 3 blocks.   ? Time 4   ? Period Weeks   ? Status Achieved   R 20, L 30  ?  ? OT SHORT TERM GOAL #6  ? Title Pt will demonstrate improved bilateral UE functional use as evidenced by increasing bilateral box/ blocks score to 23 blocks for RUE and 33 for LUE   ? Baseline R 20, L 30 at renewal   ? Time 4   ? Period Weeks   ?  Status Achieved   RUE 19, 23, LUE 34 partially met , met for LUE, partially met for RUE, met 1/2 trials  ?  ? OT SHORT TERM GOAL #7  ? Title Revised goal -Pt  will demonstrate 65* LUE shoulder flexion in prep for functional reach   ? Baseline 60* at renewal   ? Time 4   ? Period Weeks   ? Status On-going   ?  ? OT SHORT TERM GOAL #8  ? Title Pt will consistently donn pants with no more than min A   ? Time 4   ? Period Weeks   ? Status Achieved   min A per pt/ caregiver  ?  ? OT SHORT TERM GOAL  #9  ? TITLE Pt/ caregiver will verbalize understanding of adpted strategies to increase pt's safety and I with ADLs/ IADLs.   ? Time 4   ? Period Weeks   ? Status On-going   ? ?  ?  ? ?  ? ? ? ? OT Long Term Goals - 02/08/22 1129   ? ?  ? OT LONG TERM GOAL #1  ? Title Pt will perform UB dressing with setup and LB dressing with min A.   ? Period Weeks   ? Status Partially Met   pt donns shirt following set up  3 days per week, not consistent for LB min-mod A  ?  ? OT LONG TERM GOAL #2  ? Title Pt will bathe herself with min A.   ? Period Weeks   ? Status Achieved   Pt reports bathing with min A  ?  ? OT LONG TERM GOAL #3  ? Title Pt will perform simple home management/ cooking tasks with min A.   ? Time 8   ? Period Weeks   ? Status Partially Met   folding laundry, pt reports loading dishwasher , pt reports assisting with cooking mod A  ?  ? OT LONG TERM GOAL #4  ? Title Pt will retrieve a lightweight object at 75* shoulder flexion with LUE Revised goal-.Pt will retrieve a lightweight object at 70* shoulder flexion with LUE.   ? Time 8   ? Period Weeks   ? Status Revised   01/11/22-60*  ?  ? OT LONG TERM GOAL #5  ? Title Pt will increase RUE grip strength to 15 lbs for increased functional use.   ? Time 8   ? Period Weeks   ? Status Not Met   10.8 lbs  ?  ? OT LONG TERM GOAL #6  ? Title Pt will demonstrate ability to write her name and address with 100% legibility using AE prn.   ? Time 8   ? Period Weeks   ? Status On-going   not consistent-01/11/22  ?  ? OT LONG TERM GOAL #7  ? Title Pt will utilize RUE at least 75% of the time for ADLS/ IADLS with pain no greater than 4/10.   ?  Time 8   ? Period Weeks   ? Status On-going   uses 70% of the time per pt.  ? ?  ?  ? ?  ? ? ? ? ? ? ? ? Plan - 02/08/22 1136   ? ? Clinical Impression Statement Pt is progressing towards goals. Therapist sta

## 2022-02-09 ENCOUNTER — Other Ambulatory Visit: Payer: Self-pay | Admitting: Internal Medicine

## 2022-02-09 ENCOUNTER — Other Ambulatory Visit: Payer: Self-pay

## 2022-02-09 ENCOUNTER — Encounter: Payer: Self-pay | Admitting: Physical Therapy

## 2022-02-09 ENCOUNTER — Ambulatory Visit: Payer: Medicare PPO | Admitting: Vascular Surgery

## 2022-02-09 ENCOUNTER — Encounter: Payer: Self-pay | Admitting: Vascular Surgery

## 2022-02-09 VITALS — BP 135/63 | HR 77 | Temp 98.2°F | Resp 20 | Wt 203.0 lb

## 2022-02-09 DIAGNOSIS — I739 Peripheral vascular disease, unspecified: Secondary | ICD-10-CM | POA: Diagnosis not present

## 2022-02-09 DIAGNOSIS — Z992 Dependence on renal dialysis: Secondary | ICD-10-CM

## 2022-02-09 DIAGNOSIS — N186 End stage renal disease: Secondary | ICD-10-CM

## 2022-02-09 DIAGNOSIS — N2581 Secondary hyperparathyroidism of renal origin: Secondary | ICD-10-CM | POA: Diagnosis not present

## 2022-02-09 MED ORDER — ONDANSETRON 4 MG PO TBDP
4.0000 mg | ORAL_TABLET | Freq: Three times a day (TID) | ORAL | 1 refills | Status: DC | PRN
Start: 2022-02-09 — End: 2022-03-07

## 2022-02-09 NOTE — Progress Notes (Signed)
? ?Patient ID: Carolyn Russo, female   DOB: 08-22-1951, 71 y.o.   MRN: [id number] ? ?Reason for Consult: Follow-up ?  ?Referred by [name], MD ? ?Subjective:  ?   ?HPI: ? ?Carolyn Russo is a 71 y.o. female with history of end-stage renal currently dialyzed via catheter.  She does have an AV fistula in the right upper extremity but this has not been used.  She now has great toe ulcerations she states that this started after having her feet dependent on a long car ride across [place].  She is never had any lower extremity vascular intervention.  She continues to walk with the help of a walker.  She takes Plavix she does not take any anticoagulants. ? ?Past Medical History:  ?Diagnosis Date  ? Acute GI bleeding   ? Allergy   ? Anemia   ? Anterior chest wall pain   ? Appendicitis 1965  ? Asthma   ? Body mass index 37.0-37.9, adult   ? Breast pain   ? Cataract   ? both eyes  ? CHF (congestive heart failure) ([place])   ? Chronic kidney disease   ? stage 5 - on dialysis  ? Cognitive change 04/20/2021  ? r/t cva 03/2821  ? Dehydration 2014  ? Deviated septum 1971  ? Diabetes mellitus   ? no meds  ? Dyspnea 2014  ? Extrinsic asthma   ? WITH ASTHMA ATTACK  ? Fibroid 1980  ? GERD (gastroesophageal reflux disease)   ? Heart murmur   ? Hx gestational diabetes   ? Hyperlipidemia   ? Hypertension 2014  ? Inguinal hernia 1959  ? Malaise and fatigue 2014  ? Non-IgE mediated allergic asthma 2014  ? Obesity   ? Pelvic pain   ? Pregnancy, high-risk 1985  ? Stroke [facility]) 04/20/2021  ? (CVA) of right basal ganglia  ? Tonsillitis 1968  ? Uterine fibroid 1980  ? ?Family History  ?Problem Relation Age of Onset  ? Cancer Mother   ?     abdominal melamona  ? Psoriasis Mother   ? Alzheimer's disease Father   ? Cancer Cousin   ?     colon   ? Diabetes Cousin   ? Diabetes Maternal Aunt   ? Colon cancer Neg Hx   ? Colon polyps Neg Hx   ? Esophageal cancer Neg Hx   ? Rectal cancer Neg Hx   ? Stomach cancer Neg Hx   ? Breast cancer Neg Hx    ? ?Past Surgical History:  ?Procedure Laterality Date  ? APPENDECTOMY  1959  ? BASCILIC VEIN TRANSPOSITION Right 04/30/2021  ? Procedure: RIGHT FIRST STAGE [place];  Surgeon: Angelia Mould, MD;  Location: [place];  Service: Vascular;  Laterality: Right;  ? [place]  ? COLONOSCOPY    ? ESOPHAGOGASTRODUODENOSCOPY (EGD) WITH PROPOFOL N/A 04/18/2021  ? Procedure: ESOPHAGOGASTRODUODENOSCOPY (EGD) WITH PROPOFOL;  Surgeon: Gatha Mayer, MD;  Location: [facility] ENDOSCOPY;  Service: Endoscopy;  Laterality: N/A;  ? EYE SURGERY    ? bilateral cataract   ? FLEXIBLE SIGMOIDOSCOPY N/A 04/18/2021  ? Procedure: FLEXIBLE SIGMOIDOSCOPY;  Surgeon: Gatha Mayer, MD;  Location: [facility] ENDOSCOPY;  Service: Endoscopy;  Laterality: N/A;  ? HERNIA REPAIR  1959  ? IR FLUORO GUIDE CV LINE RIGHT  03/18/2021  ? IR US GUIDE VASC ACCESS RIGHT  03/18/2021  ? LEFT HEART CATH AND CORONARY ANGIOGRAPHY N/A 04/04/2017  ? Procedure: Left Heart Cath  and Coronary Angiography;  Surgeon: Belva Crome, MD;  Location: [place] CV LAB;  Service: Cardiovascular;  Laterality: N/A;  ? [place], 2004, 2007  ? RHINOPLASTY  1971  ? ROTATOR CUFF REPAIR  2003  ? SURGICAL REPAIR OF HEMORRHAGE  2015  ? TONSILLECTOMY  1968  ? ? ?Short Social History:  ?Social History  ? ?Tobacco Use  ? Smoking status: Never  ? Smokeless tobacco: Never  ?Substance Use Topics  ? Alcohol use: No  ? ? ?Allergies  ?Allergen Reactions  ? Food Anaphylaxis  ?  Peanuts; Almonds  ? Other Shortness Of Breath  ?  Peanuts; Almonds  ? Wheat Bran Shortness Of Breath  ?  Other reaction(s): shortness of breath, itching  ? Statins Itching and Other (See Comments)  ?  Generalized aches ?Other reaction(s): aches  ? Iodine Other (See Comments)  ?  The patient denied this allergy to me on 03/16/2021 ?Other reaction(s): Unknown  ? Shellfish Allergy Other (See Comments)  ?  Mouth gets raw  ? Sitagliptin   ?  Other reaction(s): Unknown ?Other reaction(s): Unknown  ?  Tetracycline Other (See Comments)  ?  Raw mouth  ? Tetracycline Hcl   ?  Other reaction(s): "raw mouth"  ? Tetracyclines & Related Itching  ? Contrast Media [Iodinated Contrast Media] Rash  ?  Happened during CT scan over 30 years ago  ? ? ?Current Outpatient Medications  ?Medication Sig Dispense Refill  ? acetaminophen (OFIRMEV) 10 MG/ML SOLN Take by mouth.    ? albuterol (VENTOLIN HFA) 108 (90 Base) MCG/ACT inhaler Inhale 2 puffs into the lungs every 4 (four) hours as needed for wheezing or shortness of breath. 18 g 3  ? BESIVANCE 0.6 % SUSP Place 1 drop into both eyes See admin instructions. After  eye injections (Patient not taking: Reported on 01/25/2022)    ? carvedilol (COREG) 3.125 MG tablet Take 1 tablet (3.125 mg total) by mouth 2 (two) times daily with a meal. 180 tablet 1  ? clindamycin (CLEOCIN) 300 MG capsule Take 1 capsule (300 mg total) by mouth 4 (four) times daily. X 7 days (Patient not taking: Reported on 01/25/2022) 28 capsule 0  ? clopidogrel (PLAVIX) 75 MG tablet Take 1 tablet (75 mg total) by mouth daily. 90 tablet 1  ? Continuous Blood Gluc Receiver (FREESTYLE LIBRE 2 READER) DEVI by Does not apply route.    ? diclofenac Sodium (VOLTAREN) 1 % GEL Apply 2 g topically 4 (four) times daily. To left wrist 150 g 0  ? diphenhydrAMINE (BENADRYL) 25 MG tablet Take 25 mg by mouth every 6 (six) hours as needed for itching.    ? fluticasone (FLONASE) 50 MCG/ACT nasal spray Place 2 sprays into the nose daily as needed for allergies.     ? hydrALAZINE (APRESOLINE) 10 MG tablet Take 1 tablet (10 mg total) by mouth every 8 (eight) hours as needed (Sys BP >180, hold on HD days). 90 tablet 0  ? hydrocortisone (ANUSOL-HC) 2.5 % rectal cream Place 1 application rectally 2 (two) times daily as needed for hemorrhoids or anal itching. 30 g 1  ? hydrocortisone cream 1 % Apply to affected area 2 times daily 30 g 1  ? hydrOXYzine (ATARAX/VISTARIL) 25 MG tablet Take 1 tablet (25 mg total) by mouth at bedtime as needed.  (Patient not taking: Reported on 01/25/2022) 30 tablet 2  ? hydrOXYzine (VISTARIL) 25 MG capsule Take 25 mg by mouth 3 (three) times daily as needed.  Dose unknown (Patient not taking: Reported on 01/25/2022)    ? insulin aspart protamine - aspart (NOVOLOG MIX 70/30 FLEXPEN) (70-30) 100 UNIT/ML FlexPen 35uam / 5u/25upm    ? insulin glargine (LANTUS) 100 UNIT/ML Solostar Pen Inject 13 Units into the skin daily. (Patient taking differently: Inject 12 Units into the skin daily.) 15 mL 2  ? Insulin Pen Needle (BD PEN NEEDLE NANO 2ND GEN) 32G X 4 MM MISC See admin instructions.    ? iron sucrose in sodium chloride 0.9 % 100 mL Inject into the vein.    ? levETIRAcetam (KEPPRA) 500 MG tablet TAKE 1 TABLET(500 MG) BY MOUTH AT BEDTIME 90 tablet 1  ? melatonin 3 MG TABS tablet Take 1 tablet (3 mg total) by mouth at bedtime. 30 tablet 0  ? Methoxy PEG-Epoetin Beta (MIRCERA IJ) Mircera    ? methylphenidate (RITALIN) 5 MG tablet Take 1 tablet (5 mg total) by mouth 2 (two) times daily with breakfast and lunch. (Patient taking differently: Take 5 mg by mouth daily.) 60 tablet 0  ? mometasone-formoterol (DULERA) 200-5 MCG/ACT AERO Inhale 2 puffs into the lungs 2 (two) times daily. 3 each 2  ? mupirocin ointment (BACTROBAN) 2 % Apply 1 application. topically 2 (two) times daily. 60 g 2  ? Nutritional Supplements (,FEEDING SUPPLEMENT, PROSOURCE PLUS) liquid Take 30 mLs by mouth 2 (two) times daily between meals. 887 mL 0  ? ondansetron (ZOFRAN) 4 MG tablet Take 1 tablet (4 mg total) by mouth every 6 (six) hours as needed for nausea or vomiting. 30 tablet 0  ? ondansetron (ZOFRAN-ODT) 4 MG disintegrating tablet Take 1 tablet (4 mg total) by mouth every 8 (eight) hours as needed for nausea or vomiting. 20 tablet 1  ? OneTouch Delica Lancets 93X MISC Use as directed to check blood sugars before breakfast and dinner dx: e11.65 100 each 11  ? pantoprazole (PROTONIX) 40 MG tablet Take 1 tablet (40 mg total) by mouth daily. 90 tablet 2  ?  phenylephrine (,USE FOR PREPARATION-H,) 0.25 % suppository Place 1 suppository rectally 2 (two) times daily. 12 suppository 0  ? polyethylene glycol powder (GLYCOLAX/MIRALAX) 17 GM/SCOOP powder Take 17 g by mo

## 2022-02-09 NOTE — Therapy (Signed)
[facility] ?[place] ?[place], [name], [address] ?Phone: [phone]   Fax:  [phone] ? ?Physical Therapy Treatment ? ?Patient Details  ?Name: Carolyn Russo ?MRN: [id number] ?Date of Birth: February 14, 1951 ?Referring Provider (PT): Courtney Heys, MD ? ? ?Encounter Date: 02/08/2022 ? ? PT End of Session - 02/09/22 1318   ? ? Visit Number 23   ? Number of Visits 28   ? Date for PT Re-Evaluation 02/18/22   ? Authorization Type [employer]-auth submitted   ? Authorization Time Period 24 visits from 10/24/21- 01/14/22; renewal for [employer] requested for dates 01-13-22 - 02-18-22 for 10 additional visits   ? Authorization - Visit Number 5   ? Authorization - Number of Visits 10   ? Progress Note Due on Visit 29   ? PT Start Time 1150   ? PT Stop Time 1232   ? PT Time Calculation (min) 42 min   ? Equipment Utilized During Treatment Gait belt   ? Activity Tolerance Patient tolerated treatment well   ? Behavior During Therapy [facility] for tasks assessed/performed   ? ?  ?  ? ?  ? ? ?Past Medical History:  ?Diagnosis Date  ? Acute GI bleeding   ? Allergy   ? Anemia   ? Anterior chest wall pain   ? Appendicitis 1965  ? Asthma   ? Body mass index 37.0-37.9, adult   ? Breast pain   ? Cataract   ? both eyes  ? CHF (congestive heart failure) ([place])   ? Chronic kidney disease   ? stage 5 - on dialysis  ? Cognitive change 04/20/2021  ? r/t cva 03/2821  ? Dehydration 2014  ? Deviated septum 1971  ? Diabetes mellitus   ? no meds  ? Dyspnea 2014  ? Extrinsic asthma   ? WITH ASTHMA ATTACK  ? Fibroid 1980  ? GERD (gastroesophageal reflux disease)   ? Heart murmur   ? Hx gestational diabetes   ? Hyperlipidemia   ? Hypertension 2014  ? Inguinal hernia 1959  ? Malaise and fatigue 2014  ? Non-IgE mediated allergic asthma 2014  ? Obesity   ? Pelvic pain   ? Pregnancy, high-risk 1985  ? Stroke [facility]) 04/20/2021  ? (CVA) of right basal ganglia  ? Tonsillitis 1968  ? Uterine fibroid 1980   ? ? ?Past Surgical History:  ?Procedure Laterality Date  ? APPENDECTOMY  1959  ? BASCILIC VEIN TRANSPOSITION Right 04/30/2021  ? Procedure: RIGHT FIRST STAGE [place];  Surgeon: Angelia Mould, MD;  Location: [place];  Service: Vascular;  Laterality: Right;  ? [place]  ? COLONOSCOPY    ? ESOPHAGOGASTRODUODENOSCOPY (EGD) WITH PROPOFOL N/A 04/18/2021  ? Procedure: ESOPHAGOGASTRODUODENOSCOPY (EGD) WITH PROPOFOL;  Surgeon: Gatha Mayer, MD;  Location: [facility] ENDOSCOPY;  Service: Endoscopy;  Laterality: N/A;  ? EYE SURGERY    ? bilateral cataract   ? FLEXIBLE SIGMOIDOSCOPY N/A 04/18/2021  ? Procedure: FLEXIBLE SIGMOIDOSCOPY;  Surgeon: Gatha Mayer, MD;  Location: [facility] ENDOSCOPY;  Service: Endoscopy;  Laterality: N/A;  ? HERNIA REPAIR  1959  ? IR FLUORO GUIDE CV LINE RIGHT  03/18/2021  ? IR US GUIDE VASC ACCESS RIGHT  03/18/2021  ? LEFT HEART CATH AND CORONARY ANGIOGRAPHY N/A 04/04/2017  ? Procedure: Left Heart Cath and Coronary Angiography;  Surgeon: Belva Crome, MD;  Location: [place] CV LAB;  Service: Cardiovascular;  Laterality: N/A;  ? MYOMECTOMY  1980,  2004, 2007  ? RHINOPLASTY  1971  ? ROTATOR CUFF REPAIR  2003  ? SURGICAL REPAIR OF HEMORRHAGE  2015  ? TONSILLECTOMY  1968  ? ? ?There were no vitals filed for this visit. ? ? Subjective Assessment - 02/09/22 1311   ? ? Subjective Pt reports no changes or problems - states she is finishing up with OT this week but wants to keep PT appts next week   ? Patient is accompained by: Family member   CNA  ? Pertinent History ESRD with HD M-W-Fri:, obesity, DM type 2, CAD, iron deficiency anemia, bronchial asthma, chronic diastolic CHF, Rt basal ganglia CVA on 04-16-21, steal syndrome RUE due to dialysis vascular access (s/p fistula placement), CKD stage V:  Rt hand weakness; Lt hemiparesis due to CVA   ? Patient Stated Goals "be independent with mobility and self care"; "be able to drive again"   ? Currently in Pain? Yes   ? Pain Score  4    ? Pain Location Hand   ? Pain Orientation Right   ? Pain Descriptors / Indicators Aching   ? Pain Type Chronic pain   ? Pain Onset More than a month ago   ? Pain Frequency Constant   ? ?  ?  ? ?  ? ? ? ? ? ? ? ? ? ? ? ? ? ? ? ? ? ? ? ? [place] Adult PT Treatment/Exercise - 02/09/22 0001   ? ?  ? Transfers  ? Transfers Sit to Stand;Stand to Sit   ? Sit to Stand 5: Supervision   ? Stand to Sit 5: Supervision   ? Comments 5 reps without UE support from high/low mat table   ?  ? Ambulation/Gait  ? Ambulation/Gait Yes   ? Ambulation/Gait Assistance 5: Supervision   ? Ambulation Distance (Feet) 115 Feet   ? Assistive device Rolling walker   ? Gait Pattern Step-through pattern;Decreased hip/knee flexion - left;Decreased step length - left   ? Ambulation Surface Level;Indoor   ?  ? Knee/Hip Exercises: Aerobic  ? Recumbent Bike Scifit level 3.0 x 3" with UE's and LE's   ?  ? Knee/Hip Exercises: Standing  ? Hip Flexion Stengthening;Right;Left;1 set;10 reps   2# weight used  ? Hip Abduction Stengthening;Right;Left;1 set;10 reps   2# weight  ? Hip Extension Stengthening;1 set;10 reps   2# used  ?  ? Knee/Hip Exercises: Seated  ? Long [employer] AROM;Strengthening;Both;1 set;10 reps;Weights;Limitations   2# weight used on each leg  ? ?  ?  ? ?  ? ? ? ? ? ? Balance Exercises - 02/09/22 0001   ? ?  ? Balance Exercises: Standing  ? Rockerboard Anterior/posterior;EO;10 reps;UE support   bil. UE support on // bars  ? Sidestepping 2 reps   along counter  ? Step Over Hurdles / Cones stepping over black beam 5 reps each foot forward and then 5 reps laterally 5 reps each foot with UE support on // bars   ? Marching Solid surface;10 reps   with bil. UE support  ? Other Standing Exercises Touching balance bubbles with each foot - 3 reps each with UE support on counter prn with CGA   ? ?  ?  ? ?  ? ? ? ? ? ? ? PT Short Term Goals - 02/09/22 1323   ? ?  ? PT SHORT TERM GOAL #1  ? Title Improve TUG score from 32 secs to </= 27 secs with  RW  with SBA to demo improved functional mobility. (all STGs due 12-17-21)   ? Baseline 11/18/21: 30.69 sec's with RW, improved from 32 sec's just not to goals;   28.15 secs with RW   ? Status Not Met   ? Target Date 12/17/21   ?  ? PT SHORT TERM GOAL #2  ? Title Pt will perform 5 times sit to stand transfers from standard chair with only 1 UE support with SBA to demo increased LE strength and balance. Update:  Perform sit to stand without UE support from mat for 3 reps to demo improved LE strength.   ? Baseline 11/18/21: 17.94 sec's with single UE support from standard height surface with retropulstion noted with each rep, min guard assist for safety; met on 12-21-21 with no LOB upon initial standing   ? Status Achieved   ? Target Date 12/17/21   ?  ? PT SHORT TERM GOAL #3  ? Title Increase Berg balance test score from 13/30 to >/= 19/30 to demo improved balance.  Updated STG; increase score to >/= 38/56   ? Baseline 12/16/21: 40/56 scored today   ? Status Achieved   ?  ? PT SHORT TERM GOAL #4  ? Title Increase gait velocity by at least .4 ft/sec with RW to demo improved gait efficiency. Revised updated LTG:  increase gait velocity to >/= 2.0 ft/sec with RW   ? Baseline 11/02/21: 1.17 ft/sec with RW as baselne:    18.4, 18.82 = 1.74 ft/sec.   17.03 secs = 1.92 ft/sec   ? Time 4   ? Period Weeks   ? Status Not Met   ? Target Date 12/17/21   ?  ? PT SHORT TERM GOAL #5  ? Title Amb. 230' with RW with SBA on flat, even surface for increased community accessibility.   ? Baseline 12/16/21: met in session today   ? Status Achieved   ?  ? PT SHORT TERM GOAL #6  ? Title Independent with HEP for strengthening & balance exs.   ? Baseline 11/18/21: met with current program per pt and CNA   ? Status Achieved   ? Target Date 11/19/21   ? ?  ?  ? ?  ? ? ? ? PT Long Term Goals - 02/09/22 1324   ? ?  ? PT LONG TERM GOAL #1  ? Title Improve TUG score to </= 20 secs with RW with supervision to reduce fall risk.   ? Baseline 32 secs with RW;   29.56 secs with RW on 01-11-22   ? Time 5   ? Period Weeks   ? Status On-going   ? Target Date 02/18/22   ?  ? PT LONG TERM GOAL #2  ? Title Pt will amb. 500' with RW with SBA on flat, even surface for increased

## 2022-02-09 NOTE — H&P (View-Only) (Signed)
? ?Patient ID: CHEYANN BLECHA, female   DOB: [birth year], 71 y.o.   MRN: [id number] ? ?Reason for Consult: Follow-up ?  ?Referred by [name], MD ? ?Subjective:  ?   ?HPI: ? ?Maisha AZLIN ZILBERMAN is a 71 y.o. female with history of end-stage renal currently dialyzed via catheter.  She does have an AV fistula in the right upper extremity but this has not been used.  She now has great toe ulcerations she states that this started after having her feet dependent on a long car ride across [place].  She is never had any lower extremity vascular intervention.  She continues to walk with the help of a walker.  She takes Plavix she does not take any anticoagulants. ? ?Past Medical History:  ?Diagnosis Date  ? Acute GI bleeding   ? Allergy   ? Anemia   ? Anterior chest wall pain   ? Appendicitis 1965  ? Asthma   ? Body mass index 37.0-37.9, adult   ? Breast pain   ? Cataract   ? both eyes  ? CHF (congestive heart failure) ([place])   ? Chronic kidney disease   ? stage 5 - on dialysis  ? Cognitive change 04/20/2021  ? r/t cva 03/2821  ? Dehydration 2014  ? Deviated septum 1971  ? Diabetes mellitus   ? no meds  ? Dyspnea 2014  ? Extrinsic asthma   ? WITH ASTHMA ATTACK  ? Fibroid 1980  ? GERD (gastroesophageal reflux disease)   ? Heart murmur   ? Hx gestational diabetes   ? Hyperlipidemia   ? Hypertension 2014  ? Inguinal hernia 1959  ? Malaise and fatigue 2014  ? Non-IgE mediated allergic asthma 2014  ? Obesity   ? Pelvic pain   ? Pregnancy, high-risk 1985  ? Stroke [facility]) 04/20/2021  ? (CVA) of right basal ganglia  ? Tonsillitis 1968  ? Uterine fibroid 1980  ? ?Family History  ?Problem Relation Age of Onset  ? Cancer Mother   ?     abdominal melamona  ? Psoriasis Mother   ? Alzheimer's disease Father   ? Cancer Cousin   ?     colon   ? Diabetes Cousin   ? Diabetes Maternal Aunt   ? Colon cancer Neg Hx   ? Colon polyps Neg Hx   ? Esophageal cancer Neg Hx   ? Rectal cancer Neg Hx   ? Stomach cancer Neg Hx   ? Breast cancer Neg Hx    ? ?Past Surgical History:  ?Procedure Laterality Date  ? APPENDECTOMY  1959  ? BASCILIC VEIN TRANSPOSITION Right 04/30/2021  ? Procedure: RIGHT FIRST STAGE [place];  Surgeon: Angelia Mould, MD;  Location: [place];  Service: Vascular;  Laterality: Right;  ? [place]  ? COLONOSCOPY    ? ESOPHAGOGASTRODUODENOSCOPY (EGD) WITH PROPOFOL N/A 04/18/2021  ? Procedure: ESOPHAGOGASTRODUODENOSCOPY (EGD) WITH PROPOFOL;  Surgeon: Gatha Mayer, MD;  [facility] END[facility]COPY;  Service: Endoscopy;  Laterality: N/A;  ? EYE SURGERY    ? bilateral cataract   ? FLEXIBLE SIGMOIDOSCOPY N/A 04/18/2021  ? Procedure: FLEXIBLE SIGMOIDOSCOPY;  Surgeon: Gatha Mayer, MD;  Location: [facility] ENDOSCOPY;  Service: Endoscopy;  Laterality: N/A;  ? HERNIA REPAIR  1959  ? IR FLUORO GUIDE CV LINE RIGHT  03/18/2021  ? IR US GUIDE VASC ACCESS RIGHT  03/18/2021  ? LEFT HEART CATH AND CORONARY ANGIOGRAPHY N/A 04/04/2017  ? Procedure: Left Heart Cath  and Coronary Angiography;  Surgeon: Belva Crome, MD;  Location: [place] CV LAB;  Service: Cardiovascular;  Laterality: N/A;  ? [place], 2004, 2007  ? RHINOPLASTY  1971  ? ROTATOR CUFF REPAIR  2003  ? SURGICAL REPAIR OF HEMORRHAGE  2015  ? TONSILLECTOMY  1968  ? ? ?Short Social History:  ?Social History  ? ?Tobacco Use  ? Smoking status: Never  ? Smokeless tobacco: Never  ?Substance Use Topics  ? Alcohol use: No  ? ? ?Allergies  ?Allergen Reactions  ? Food Anaphylaxis  ?  Peanuts; Almonds  ? Other Shortness Of Breath  ?  Peanuts; Almonds  ? Wheat Bran Shortness Of Breath  ?  Other reaction(s): shortness of breath, itching  ? Statins Itching and Other (See Comments)  ?  Generalized aches ?Other reaction(s): aches  ? Iodine Other (See Comments)  ?  The patient denied this allergy to me on 03/16/2021 ?Other reaction(s): Unknown  ? Shellfish Allergy Other (See Comments)  ?  Mouth gets raw  ? Sitagliptin   ?  Other reaction(s): Unknown ?Other reaction(s): Unknown  ?  Tetracycline Other (See Comments)  ?  Raw mouth  ? Tetracycline Hcl   ?  Other reaction(s): "raw mouth"  ? Tetracyclines & Related Itching  ? Contrast Media [Iodinated Contrast Media] Rash  ?  Happened during CT scan over 30 years ago  ? ? ?Current Outpatient Medications  ?Medication Sig Dispense Refill  ? acetaminophen (OFIRMEV) 10 MG/ML SOLN Take by mouth.    ? albuterol (VENTOLIN HFA) 108 (90 Base) MCG/ACT inhaler Inhale 2 puffs into the lungs every 4 (four) hours as needed for wheezing or shortness of breath. 18 g 3  ? BESIVANCE 0.6 % SUSP Place 1 drop into both eyes See admin instructions. After  eye injections (Patient not taking: Reported on 01/25/2022)    ? carvedilol (COREG) 3.125 MG tablet Take 1 tablet (3.125 mg total) by mouth 2 (two) times daily with a meal. 180 tablet 1  ? clindamycin (CLEOCIN) 300 MG capsule Take 1 capsule (300 mg total) by mouth 4 (four) times daily. X 7 days (Patient not taking: Reported on 01/25/2022) 28 capsule 0  ? clopidogrel (PLAVIX) 75 MG tablet Take 1 tablet (75 mg total) by mouth daily. 90 tablet 1  ? Continuous Blood Gluc Receiver (FREESTYLE LIBRE 2 READER) DEVI by Does not apply route.    ? diclofenac Sodium (VOLTAREN) 1 % GEL Apply 2 g topically 4 (four) times daily. To left wrist 150 g 0  ? diphenhydrAMINE (BENADRYL) 25 MG tablet Take 25 mg by mouth every 6 (six) hours as needed for itching.    ? fluticasone (FLONASE) 50 MCG/ACT nasal spray Place 2 sprays into the nose daily as needed for allergies.     ? hydrALAZINE (APRESOLINE) 10 MG tablet Take 1 tablet (10 mg total) by mouth every 8 (eight) hours as needed (Sys BP >180, hold on HD days). 90 tablet 0  ? hydrocortisone (ANUSOL-HC) 2.5 % rectal cream Place 1 application rectally 2 (two) times daily as needed for hemorrhoids or anal itching. 30 g 1  ? hydrocortisone cream 1 % Apply to affected area 2 times daily 30 g 1  ? hydrOXYzine (ATARAX/VISTARIL) 25 MG tablet Take 1 tablet (25 mg total) by mouth at bedtime as needed.  (Patient not taking: Reported on 01/25/2022) 30 tablet 2  ? hydrOXYzine (VISTARIL) 25 MG capsule Take 25 mg by mouth 3 (three) times daily as needed.  Dose unknown (Patient not taking: Reported on 01/25/2022)    ? insulin aspart protamine - aspart (NOVOLOG MIX 70/30 FLEXPEN) (70-30) 100 UNIT/ML FlexPen 35uam / 5u/25upm    ? insulin glargine (LANTUS) 100 UNIT/ML Solostar Pen Inject 13 Units into the skin daily. (Patient taking differently: Inject 12 Units into the skin daily.) 15 mL 2  ? Insulin Pen Needle (BD PEN NEEDLE NANO 2ND GEN) 32G X 4 MM MISC See admin instructions.    ? iron sucrose in sodium chloride 0.9 % 100 mL Inject into the vein.    ? levETIRAcetam (KEPPRA) 500 MG tablet TAKE 1 TABLET(500 MG) BY MOUTH AT BEDTIME 90 tablet 1  ? melatonin 3 MG TABS tablet Take 1 tablet (3 mg total) by mouth at bedtime. 30 tablet 0  ? Methoxy PEG-Epoetin Beta (MIRCERA IJ) Mircera    ? methylphenidate (RITALIN) 5 MG tablet Take 1 tablet (5 mg total) by mouth 2 (two) times daily with breakfast and lunch. (Patient taking differently: Take 5 mg by mouth daily.) 60 tablet 0  ? mometasone-formoterol (DULERA) 200-5 MCG/ACT AERO Inhale 2 puffs into the lungs 2 (two) times daily. 3 each 2  ? mupirocin ointment (BACTROBAN) 2 % Apply 1 application. topically 2 (two) times daily. 60 g 2  ? Nutritional Supplements (,FEEDING SUPPLEMENT, PROSOURCE PLUS) liquid Take 30 mLs by mouth 2 (two) times daily between meals. 887 mL 0  ? ondansetron (ZOFRAN) 4 MG tablet Take 1 tablet (4 mg total) by mouth every 6 (six) hours as needed for nausea or vomiting. 30 tablet 0  ? ondansetron (ZOFRAN-ODT) 4 MG disintegrating tablet Take 1 tablet (4 mg total) by mouth every 8 (eight) hours as needed for nausea or vomiting. 20 tablet 1  ? OneTouch Delica Lancets 22L MISC Use as directed to check blood sugars before breakfast and dinner dx: e11.65 100 each 11  ? pantoprazole (PROTONIX) 40 MG tablet Take 1 tablet (40 mg total) by mouth daily. 90 tablet 2  ?  phenylephrine (,USE FOR PREPARATION-H,) 0.25 % suppository Place 1 suppository rectally 2 (two) times daily. 12 suppository 0  ? polyethylene glycol powder (GLYCOLAX/MIRALAX) 17 GM/SCOOP powder Take 17 g by mo

## 2022-02-10 ENCOUNTER — Ambulatory Visit: Payer: Medicare PPO | Admitting: Physical Therapy

## 2022-02-10 ENCOUNTER — Ambulatory Visit: Payer: Medicare PPO | Admitting: Occupational Therapy

## 2022-02-10 DIAGNOSIS — M79641 Pain in right hand: Secondary | ICD-10-CM | POA: Diagnosis not present

## 2022-02-10 DIAGNOSIS — M6281 Muscle weakness (generalized): Secondary | ICD-10-CM | POA: Diagnosis not present

## 2022-02-10 DIAGNOSIS — R208 Other disturbances of skin sensation: Secondary | ICD-10-CM

## 2022-02-10 DIAGNOSIS — R2689 Other abnormalities of gait and mobility: Secondary | ICD-10-CM

## 2022-02-10 DIAGNOSIS — I69354 Hemiplegia and hemiparesis following cerebral infarction affecting left non-dominant side: Secondary | ICD-10-CM | POA: Diagnosis not present

## 2022-02-10 DIAGNOSIS — M25512 Pain in left shoulder: Secondary | ICD-10-CM

## 2022-02-10 DIAGNOSIS — R2681 Unsteadiness on feet: Secondary | ICD-10-CM

## 2022-02-10 DIAGNOSIS — R278 Other lack of coordination: Secondary | ICD-10-CM

## 2022-02-10 NOTE — Patient Instructions (Addendum)
Local Driver Evaluation Programs: ° °Comprehensive Evaluation: includes clinical and in vehicle behind the wheel testing by OCCUPATIONAL THERAPIST. Programs have varying levels of adaptive controls available for trial.  ° °[facility] °[address] °[place], [place]  [address] °[phone] or [phone] °http://www.driver-rehab.com °Evaluator:  Cyndee Crompton, OT/CDRS/CDI/SCDCM/Low Vision Certification ° °[facility] °[address] °[place], [place] [address] °[phone] °https://www.[facility].org/home/services/rehabilitation.aspx °Evaluators:  Shannon Sheek, OT and Jill Tucker, OT ° °[facility] [place] [facility] (ONLY SERVES VETERANS!!) °[facility] °[address] °[place], [place]  [address] °[phone] °http://www.[place].va.gov/services/Physical_Medicine_Rehabilitation_Services.asp °Evaluators:  Eric Andrews, KT; Heidi Harris, KT;  Gary Landino, KT (KT=kiniesotherapist) ° ° °Clinical evaluations only:  Includes clinical testing, refers to other programs or local certified driving instructor for behind the wheel testing. ° °[facility] (outpatient Rehab) °[facility] °[address] °[place], [place] [address] °[phone] for scheduling °http://www.[facility].edu/Outpatient-Rehabilitation/Neurorehabilitation-Therapy.htm °Evaluators:  Kelly Lambeth, OT; Kate Phillips, OT ° °Other area clinical evaluators available upon request including [facility], [facility] and [facility]. ° ° °    Resource List °What is a Driver Evaluation: °Your Road Ahead - A Guide to Comprehensive Driving Evaluations °http://www.thehartford.com/resources/mature-market-excellence/publications-on-aging ° °Association for [employer] Rehabilitation Services - Disability and Driving Fact Sheets °http://www.aded.net/?page=510 ° °Driving after a Brain  Injury: °[employer] °http://www.biausa.org/tbims-abstracts/if-there-is-an-effective-way-to-determine-if-someone-is-ready-to-drive-after-tbi?A=SearchResult&SearchID=9495675&ObjectID=2758842&ObjectType=35 ° °Driving with Adaptive Equipment: °[facility] Process °http://www.driver-rehab.com/adaptive-equipment ° °[employer] Mob[employer] °http://www.nmeda.com/ ° ° ° ° ° ° °  °

## 2022-02-10 NOTE — Therapy (Signed)
[facility] ?[place], [name], [address] ?Phone: [phone]   Fax:  [phone] ? ?Occupational Therapy Treatment ? ?Patient Details  ?Name: Carolyn Russo ?MRN: [id number] ?Date of Birth: 02-24-1951 ?Referring Provider (OT): Dr. Dagoberto Ligas ? ? ?Encounter Date: 02/10/2022 ? ?OCCUPATIONAL THERAPY DISCHARGE SUMMARY ? ? ?Current functional level related to goals / functional outcomes: ?Pt. Made progress towards goals , however she did not fully achieve goals due to severity of deficits, bilateral UE involvement, pain  and pt's complex medical issues( ie: elevated BP at times and fatigue from dialysis). ?  ?Remaining deficits: ?Pain, decreased strength, decreased ROM, decreased coordination, cognitive deficits, visual perceptual deficits ?  ?Education / Equipment: ?Pt was educated regarding: HEP and ADL strategies. Pt verbalized understanding.  ? ?Patient agrees to discharge. Patient goals were partially met. Patient is being discharged due to maximized rehab potential.  Pt's progress has plateaued at this time. ? ?  ? ? OT End of Session - 02/10/22 1313   ? ? Visit Number 23   ? Number of Visits 32   ? Date for OT Re-Evaluation 03/09/22   ? Authorization Type [employer]   ? Authorization Time Period 27 visits 10/24/21 - 02/10/22   ? Authorization - Visit Number 23   ? Authorization - Number of Visits 27   ? OT Start Time 1148   ? OT Stop Time 1226   ? OT Time Calculation (min) 38 min   ? Activity Tolerance Patient tolerated treatment well;Patient limited by fatigue   ? Behavior During Therapy [facility] for tasks assessed/performed   ? ?  ?  ? ?  ? ? ?Past Medical History:  ?Diagnosis Date  ? Acute GI bleeding   ? Allergy   ? Anemia   ? Anterior chest wall pain   ? Appendicitis 1965  ? Asthma   ? Body mass index 37.0-37.9, adult   ? Breast pain   ? Cataract   ? both eyes  ? CHF (congestive heart failure) ([place])   ? Chronic kidney disease   ? stage 5 - on dialysis   ? Cognitive change 04/20/2021  ? r/t cva 03/2821  ? Dehydration 2014  ? Deviated septum 1971  ? Diabetes mellitus   ? no meds  ? Dyspnea 2014  ? Extrinsic asthma   ? WITH ASTHMA ATTACK  ? Fibroid 1980  ? GERD (gastroesophageal reflux disease)   ? Heart murmur   ? Hx gestational diabetes   ? Hyperlipidemia   ? Hypertension 2014  ? Inguinal hernia 1959  ? Malaise and fatigue 2014  ? Non-IgE mediated allergic asthma 2014  ? Obesity   ? Pelvic pain   ? Pregnancy, high-risk 1985  ? Stroke [facility]) 04/20/2021  ? (CVA) of right basal ganglia  ? Tonsillitis 1968  ? Uterine fibroid 1980  ? ? ?Past Surgical History:  ?Procedure Laterality Date  ? APPENDECTOMY  1959  ? BASCILIC VEIN TRANSPOSITION Right 04/30/2021  ? Procedure: RIGHT FIRST STAGE [place];  Surgeon: Angelia Mould, MD;  Location: [place];  Service: Vascular;  Laterality: Right;  ? [place]  ? COLONOSCOPY    ? ESOPHAGOGASTRODUODENOSCOPY (EGD) WITH PROPOFOL N/A 04/18/2021  ? Procedure: ESOPHAGOGASTRODUODENOSCOPY (EGD) WITH PROPOFOL;  Surgeon: Gatha Mayer, MD;  [facility] ENDOSCOPY;  Service: Endoscopy;  Laterality: N/A;  ? EYE SURGERY    ? bilateral cataract   ? FLEXIBLE SIGMOIDOSCOPY N/A 04/18/2021  ? Procedure: FLEXIBLE  SIGMOIDOSCOPY;  Surgeon: Gatha Mayer, MD;  Location: [facility] ENDOSCOPY;  Service: Endoscopy;  Laterality: N/A;  ? HERNIA REPAIR  1959  ? IR FLUORO GUIDE CV LINE RIGHT  03/18/2021  ? IR US GUIDE VASC ACCESS RIGHT  03/18/2021  ? LEFT HEART CATH AND CORONARY ANGIOGRAPHY N/A 04/04/2017  ? Procedure: Left Heart Cath and Coronary Angiography;  Surgeon: Belva Crome, MD;  Location: [place] CV LAB;  Service: Cardiovascular;  Laterality: N/A;  ? [place], 2004, 2007  ? RHINOPLASTY  1971  ? ROTATOR CUFF REPAIR  2003  ? SURGICAL REPAIR OF HEMORRHAGE  2015  ? TONSILLECTOMY  1968  ? ? ?There were no vitals filed for this visit. ? ? Subjective Assessment - 02/10/22 1152   ? ? Subjective  Pt rpeorts foot pain   ?  Pertinent History Carolyn Russo is a 71 y.o. female with PMHx of obesity, DM, HTN, HLD, ESRD on HD who presented with left sided weakness for 3 days on 04/16/2021. Pt was diagnosed with R basal ganglia infarct Pt went to rehab but then transferred back to acute service on 7/13 for likely seizure. Pt returned to CIR and was d/c home on 05/27/21. Pt received HHOT and PT. Pt has R hand weakness s/p fistula placement. PMH: diabetes, hypertension, hyperlipidemia, ESRD on HD MWF.   ? Patient Stated Goals to be like I was prior to my stroke   ? Currently in Pain? Yes   ? Pain Score 6    ? Pain Location Foot   ? Pain Orientation Right   ? Pain Descriptors / Indicators Aching   ? Pain Type Chronic pain   ? Pain Onset More than a month ago   ? Pain Frequency Constant   ? Aggravating Factors  movement   ? Pain Relieving Factors repostioning   ? Pain Score 4   ? Pain Location Hand   ? Pain Orientation Right   ? Pain Descriptors / Indicators Aching   ? Pain Type Acute pain   ? Pain Onset More than a month ago   ? Pain Frequency Intermittent   ? Aggravating Factors  meds   ? Pain Relieving Factors use   ? ?  ?  ? ?  ? ? ? ? ? ?Treatment: Therapist checked progress towards goals in prep for d/c. ?Closed chain shoulder flexion to floor then back to lap, min v.c ?Removing graded clothespins from rack, 1-8 # with right and left UE's for sustained pinch, min-mod difficulty and min v.c for hand positioning. ?Yellow putty exercises for grip bilateral UE's  and tip pinch with LUE, lateral pinch with RUE, min v.c and demonstration. ? ? ? ? ? ? ? ? ? ? ? ? ? ? ? ? ? ? ? OT Short Term Goals - 02/10/22 1220   ? ?  ? OT SHORT TERM GOAL #1  ? Title I with initial HEP   ? Time 4   ? Period Weeks   ? Status Achieved   ? Target Date 11/18/21   ?  ? OT SHORT TERM GOAL #2  ? Title Pt will donn shirt  and bathe UB with min A consistently   ? Time 4   ? Period Weeks   ? Status Achieved   met pt donns shirt without assistance 3 days per week, and  she reports assisting with bathing  ?  ? OT SHORT TERM GOAL #3  ? Title Pt will be  I with bilateral UE positioning including splinting  to minimize pain and risk for contracture and injury.   ? Time 4   ? Period Weeks   ? Status Achieved   met, pt verbalizes understanding  ?  ? OT SHORT TERM GOAL #4  ? Title Pt will demonstrate 60* LUE shoulder flexion in prep for functional reach   ? Time 4   ? Period Weeks   ? Status Achieved   60*  ?  ? OT SHORT TERM GOAL #5  ? Title Pt will demonstrate improved bilateral UE functional use as evidenced by increasing bilateral box/ blocks score by 3 blocks.   ? Time 4   ? Period Weeks   ? Status Achieved   R 20, L 30  ?  ? OT SHORT TERM GOAL #6  ? Title Pt will demonstrate improved bilateral UE functional use as evidenced by increasing bilateral box/ blocks score to 23 blocks for RUE and 33 for LUE   ? Baseline R 20, L 30 at renewal   ? Time 4   ? Period Weeks   ? Status Achieved   RUE 19, 23, LUE 34 partially met , met for LUE, partially met for RUE, met 1/2 trials  ?  ? OT SHORT TERM GOAL #7  ? Title Revised goal -Pt will demonstrate 65* LUE shoulder flexion in prep for functional reach   ? Baseline 60* at renewal   ? Time 4   ? Period Weeks   ? Status Achieved   ?  ? OT SHORT TERM GOAL #8  ? Title Pt will consistently donn pants with no more than min A   ? Time 4   ? Period Weeks   ? Status Achieved   min A per pt/ caregiver  ?  ? OT SHORT TERM GOAL  #9  ? TITLE Pt/ caregiver will verbalize understanding of adpted strategies to increase pt's safety and I with ADLs/ IADLs.   ? Time 4   ? Period Weeks   ? Status Achieved   ? ?  ?  ? ?  ? ? ? ? OT Long Term Goals - 02/10/22 1159   ? ?  ? OT LONG TERM GOAL #1  ? Title Pt will perform UB dressing with setup and LB dressing with min A.   ? Period Weeks   ? Status Partially Met   pt donns shirt following set up  3 days per week, not consistent for LB min-mod A  ?  ? OT LONG TERM GOAL #2  ? Title Pt will bathe herself with min A.   ?  Period Weeks   ? Status Achieved   Pt reports bathing with min A  ?  ? OT LONG TERM GOAL #3  ? Title Pt will perform simple home management/ cooking tasks with min A.   ? Time 8   ? Period Weeks   ? Statu

## 2022-02-11 ENCOUNTER — Other Ambulatory Visit: Payer: Self-pay

## 2022-02-11 ENCOUNTER — Encounter: Payer: Self-pay | Admitting: Physical Therapy

## 2022-02-11 DIAGNOSIS — Z992 Dependence on renal dialysis: Secondary | ICD-10-CM | POA: Diagnosis not present

## 2022-02-11 DIAGNOSIS — N2581 Secondary hyperparathyroidism of renal origin: Secondary | ICD-10-CM | POA: Diagnosis not present

## 2022-02-11 DIAGNOSIS — N186 End stage renal disease: Secondary | ICD-10-CM | POA: Diagnosis not present

## 2022-02-11 NOTE — Therapy (Signed)
[facility] ?[place], [name], [address] ?Phone: [phone]   Fax:  [phone] ? ?Physical Therapy Treatment ? ?Patient Details  ?Name: Carolyn Russo ?MRN: [id number] ?Date of Birth: September 30, 1951 ?Referring Provider (PT): Courtney Heys, MD ? ? ?Encounter Date: 02/10/2022 ? ? PT End of Session - 02/11/22 1026   ? ? Visit Number 24   ? Number of Visits 28   ? Date for PT Re-Evaluation 02/18/22   ? Authorization Type [employer] Medicare-auth submitted   ? Authorization Time Period 24 visits from 10/24/21- 01/14/22; renewal for [employer] requested for dates 01-13-22 - 02-18-22 for 10 additional visits   ? Authorization - Visit Number 6   ? Authorization - Number of Visits 10   ? Progress Note Due on Visit 29   ? PT Start Time 1105   ? PT Stop Time 1145   ? PT Time Calculation (min) 40 min   ? Equipment Utilized During Treatment Gait belt   ? Activity Tolerance Patient tolerated treatment well   ? Behavior During Therapy [facility] for tasks assessed/performed   ? ?  ?  ? ?  ? ? ?Past Medical History:  ?Diagnosis Date  ? Acute GI bleeding   ? Allergy   ? Anemia   ? Anterior chest wall pain   ? Appendicitis 1965  ? Asthma   ? Body mass index 37.0-37.9, adult   ? Breast pain   ? Cataract   ? both eyes  ? CHF (congestive heart failure) ([place])   ? Chronic kidney disease   ? stage 5 - on dialysis  ? Cognitive change 04/20/2021  ? r/t cva 03/2821  ? Dehydration 2014  ? Deviated septum 1971  ? Diabetes mellitus   ? no meds  ? Dyspnea 2014  ? Extrinsic asthma   ? WITH ASTHMA ATTACK  ? Fibroid 1980  ? GERD (gastroesophageal reflux disease)   ? Heart murmur   ? Hx gestational diabetes   ? Hyperlipidemia   ? Hypertension 2014  ? Inguinal hernia 1959  ? Malaise and fatigue 2014  ? Non-IgE mediated allergic asthma 2014  ? Obesity   ? Pelvic pain   ? Pregnancy, high-risk 1985  ? Stroke [facility]) 04/20/2021  ? (CVA) of right basal ganglia  ? Tonsillitis 1968  ? Uterine fibroid 1980   ? ? ?Past Surgical History:  ?Procedure Laterality Date  ? APPENDECTOMY  1959  ? BASCILIC VEIN TRANSPOSITION Right 04/30/2021  ? Procedure: RIGHT FIRST STAGE [place];  Surgeon: Angelia Mould, MD;  Location: [place];  Service: Vascular;  Laterality: Right;  ? [place]  ? COLONOSCOPY    ? ESOPHAGOGASTRODUODENOSCOPY (EGD) WITH PROPOFOL N/A 04/18/2021  ? Procedure: ESOPHAGOGASTRODUODENOSCOPY (EGD) WITH PROPOFOL;  Surgeon: Gatha Mayer, MD;  Location: [facility] ENDOSCOPY;  Service: Endoscopy;  Laterality: N/A;  ? EYE SURGERY    ? bilateral cataract   ? FLEXIBLE SIGMOIDOSCOPY N/A 04/18/2021  ? Procedure: FLEXIBLE SIGMOIDOSCOPY;  Surgeon: Gatha Mayer, MD;  Location: [facility] ENDOSCOPY;  Service: Endoscopy;  Laterality: N/A;  ? HERNIA REPAIR  1959  ? IR FLUORO GUIDE CV LINE RIGHT  03/18/2021  ? IR US GUIDE VASC ACCESS RIGHT  03/18/2021  ? LEFT HEART CATH AND CORONARY ANGIOGRAPHY N/A 04/04/2017  ? Procedure: Left Heart Cath and Coronary Angiography;  Surgeon: Belva Crome, MD;  Location: [place] CV LAB;  Service: Cardiovascular;  Laterality: N/A;  ? MYOMECTOMY  1980,  2004, 2007  ? RHINOPLASTY  1971  ? ROTATOR CUFF REPAIR  2003  ? SURGICAL REPAIR OF HEMORRHAGE  2015  ? TONSILLECTOMY  1968  ? ? ?There were no vitals filed for this visit. ? ? Subjective Assessment - 02/11/22 1023   ? ? Subjective Pt reports she will be having surgery on her Rt toe in a couple of weeks to see if there is a clot or blockage impeding the circulation   ? Patient is accompained by: Family member   CNA  ? Pertinent History ESRD with HD M-W-Fri:, obesity, DM type 2, CAD, iron deficiency anemia, bronchial asthma, chronic diastolic CHF, Rt basal ganglia CVA on 04-16-21, steal syndrome RUE due to dialysis vascular access (s/p fistula placement), CKD stage V:  Rt hand weakness; Lt hemiparesis due to CVA   ? Patient Stated Goals "be independent with mobility and self care"; "be able to drive again"   ? Currently in Pain?  Yes   ? Pain Score 6    ? Pain Location Foot   ? Pain Orientation Right   ? Pain Descriptors / Indicators Aching   ? Pain Type Chronic pain   ? Pain Onset 1 to 4 weeks ago   ? Pain Frequency Constant   ? ?  ?  ? ?  ? ? ? ? ? ? ? ? ? ? ? ? ? ? ? ? ? ? ? ? [place] Adult PT Treatment/Exercise - 02/11/22 0001   ? ?  ? Transfers  ? Transfers Sit to Stand;Stand to Sit   ? Sit to Stand 5: Supervision   ? Stand to Sit 5: Supervision   ? Comments 5 reps without UE support from high/low mat table   ?  ? Ambulation/Gait  ? Ambulation/Gait Yes   ? Ambulation/Gait Assistance 5: Supervision   ? Ambulation Distance (Feet) 230 Feet   ? Assistive device Rolling walker   ? Gait Pattern Step-through pattern;Decreased hip/knee flexion - left;Decreased step length - left   ? Ambulation Surface Level;Indoor   ?  ? Knee/Hip Exercises: Aerobic  ? Recumbent Bike --   ?  ? Knee/Hip Exercises: Standing  ? Hip Flexion Stengthening;Right;Left;1 set;10 reps   2# weight used  ? Hip Abduction Stengthening;Right;Left;1 set;10 reps   2# weight  ? Hip Extension Stengthening;1 set;10 reps   2# used  ? Forward Step Up Right;Left;1 set;5 reps   with UE support on hand rails  ?  ? Knee/Hip Exercises: Seated  ? Long [employer] AROM;Strengthening;Both;1 set;10 reps;Weights;Limitations   2# weight used on each leg  ? ?  ?  ? ?  ? ? ? ? ? ? Balance Exercises - 02/11/22 0001   ? ?  ? Balance Exercises: Standing  ? Rockerboard Anterior/posterior;EO;10 reps;UE support   bil. UE support on // bars  ? Marching Solid surface;10 reps   with bil. UE support  ? Other Standing Exercises Tap ups to 1st step 5 reps each foot alternating with UE support on rails   ? ?  ?  ? ?  ? ?sidestepping inside // bars 10' x 2 reps with minimal UE support with SBA ? ? ? ? ? ? ? PT Short Term Goals - 02/11/22 1034   ? ?  ? PT SHORT TERM GOAL #1  ? Title Improve TUG score from 32 secs to </= 27 secs with RW with SBA to demo improved functional mobility. (all STGs due 12-17-21)   ? Baseline  11/18/21: 30.69 sec's with RW, improved from 32 sec's just not to goals;   28.15 secs with RW   ? Status Not Met   ? Target Date 12/17/21   ?  ? PT SHORT TERM GOAL #2  ? Title Pt will perform 5 times sit to stand transfers from standard chair with only 1 UE support with SBA to demo increased LE strength and balance. Update:  Perform sit to stand without UE support from mat for 3 reps to demo improved LE strength.   ? Baseline 11/18/21: 17.94 sec's with single UE support from standard height surface with retropulstion noted with each rep, min guard assist for safety; met on 12-21-21 with no LOB upon initial standing   ? Status Achieved   ? Target Date 12/17/21   ?  ? PT SHORT TERM GOAL #3  ? Title Increase Berg balance test score from 13/30 to >/= 19/30 to demo improved balance.  Updated STG; increase score to >/= 38/56   ? Baseline 12/16/21: 40/56 scored today   ? Status Achieved   ?  ? PT SHORT TERM GOAL #4  ? Title Increase gait velocity by at least .4 ft/sec with RW to demo improved gait efficiency. Revised updated LTG:  increase gait velocity to >/= 2.0 ft/sec with RW   ? Baseline 11/02/21: 1.17 ft/sec with RW as baselne:    18.4, 18.82 = 1.74 ft/sec.   17.03 secs = 1.92 ft/sec   ? Time 4   ? Period Weeks   ? Status Not Met   ? Target Date 12/17/21   ?  ? PT SHORT TERM GOAL #5  ? Title Amb. 230' with RW with SBA on flat, even surface for increased community accessibility.   ? Baseline 12/16/21: met in session today   ? Status Achieved   ?  ? PT SHORT TERM GOAL #6  ? Title Independent with HEP for strengthening & balance exs.   ? Baseline 11/18/21: met with current program per pt and CNA   ? Status Achieved   ? Target Date 11/19/21   ? ?  ?  ? ?  ? ? ? ? PT Long Term Goals - 02/11/22 1034   ? ?  ? PT LONG TERM GOAL #1  ? Title Improve TUG score to </= 20 secs with RW with supervision to reduce fall risk.   ? Baseline 32 secs with RW;  29.56 secs with RW on 01-11-22   ? Time 5   ? Period Weeks   ? Status On-going   ?  Target Date 02/18/22   ?  ? PT LONG TERM GOAL #2  ? Title Pt will amb. 500' with RW with SBA on flat, even surface for increased community accessibility.   ? Baseline 102' with RW;   230' with RW on 01-11-22   ? T

## 2022-02-14 DIAGNOSIS — Z992 Dependence on renal dialysis: Secondary | ICD-10-CM | POA: Diagnosis not present

## 2022-02-14 DIAGNOSIS — N186 End stage renal disease: Secondary | ICD-10-CM | POA: Diagnosis not present

## 2022-02-14 DIAGNOSIS — N2581 Secondary hyperparathyroidism of renal origin: Secondary | ICD-10-CM | POA: Diagnosis not present

## 2022-02-15 ENCOUNTER — Ambulatory Visit: Payer: Medicare PPO | Admitting: Physical Therapy

## 2022-02-15 VITALS — BP 167/78 | HR 77

## 2022-02-15 DIAGNOSIS — R2681 Unsteadiness on feet: Secondary | ICD-10-CM

## 2022-02-15 DIAGNOSIS — R2689 Other abnormalities of gait and mobility: Secondary | ICD-10-CM | POA: Diagnosis not present

## 2022-02-15 DIAGNOSIS — M79641 Pain in right hand: Secondary | ICD-10-CM | POA: Diagnosis not present

## 2022-02-15 DIAGNOSIS — M6281 Muscle weakness (generalized): Secondary | ICD-10-CM

## 2022-02-15 DIAGNOSIS — I69354 Hemiplegia and hemiparesis following cerebral infarction affecting left non-dominant side: Secondary | ICD-10-CM | POA: Diagnosis not present

## 2022-02-15 DIAGNOSIS — R278 Other lack of coordination: Secondary | ICD-10-CM | POA: Diagnosis not present

## 2022-02-15 DIAGNOSIS — R208 Other disturbances of skin sensation: Secondary | ICD-10-CM | POA: Diagnosis not present

## 2022-02-15 DIAGNOSIS — M25512 Pain in left shoulder: Secondary | ICD-10-CM | POA: Diagnosis not present

## 2022-02-16 ENCOUNTER — Encounter: Payer: Self-pay | Admitting: Physical Therapy

## 2022-02-16 DIAGNOSIS — N186 End stage renal disease: Secondary | ICD-10-CM | POA: Diagnosis not present

## 2022-02-16 DIAGNOSIS — Z992 Dependence on renal dialysis: Secondary | ICD-10-CM | POA: Diagnosis not present

## 2022-02-16 DIAGNOSIS — N2581 Secondary hyperparathyroidism of renal origin: Secondary | ICD-10-CM | POA: Diagnosis not present

## 2022-02-16 NOTE — Therapy (Signed)
[facility] ?[place] ?[place], [name], [address] ?Phone: [phone]   Fax:  [phone] ? ?Physical Therapy Treatment ? ?Patient Details  ?Name: Carolyn Russo ?MRN: [id number] ?Date of Birth: Oct 09, 1951 ?Referring Provider (PT): Carolyn Heys, MD ? ? ?Encounter Date: 02/15/2022 ? ? PT End of Session - 02/16/22 1752   ? ? Visit Number 25   ? Number of Visits 28   ? Date for PT Re-Evaluation 02/18/22   ? Authorization Type [employer] Medicare-auth submitted   ? Authorization Time Period 24 visits from 10/24/21- 01/14/22; renewal for [employer] requested for dates 01-13-22 - 02-18-22 for 10 additional visits   ? Authorization - Visit Number 7   ? Authorization - Number of Visits 10   ? Progress Note Due on Visit 29   ? PT Start Time 1147   ? PT Stop Time 1233   ? PT Time Calculation (min) 46 min   ? Equipment Utilized During Treatment Gait belt   ? Activity Tolerance Patient tolerated treatment well   ? Behavior During Therapy [facility] for tasks assessed/performed   ? ?  ?  ? ?  ? ? ?Past Medical History:  ?Diagnosis Date  ? Acute GI bleeding   ? Allergy   ? Anemia   ? Anterior chest wall pain   ? Appendicitis 1965  ? Asthma   ? Body mass index 37.0-37.9, adult   ? Breast pain   ? Cataract   ? both eyes  ? CHF (congestive heart failure) ([place])   ? Chronic kidney disease   ? stage 5 - on dialysis  ? Cognitive change 04/20/2021  ? r/t cva 03/2821  ? Dehydration 2014  ? Deviated septum 1971  ? Diabetes mellitus   ? no meds  ? Dyspnea 2014  ? Extrinsic asthma   ? WITH ASTHMA ATTACK  ? Fibroid 1980  ? GERD (gastroesophageal reflux disease)   ? Heart murmur   ? Hx gestational diabetes   ? Hyperlipidemia   ? Hypertension 2014  ? Inguinal hernia 1959  ? Malaise and fatigue 2014  ? Non-IgE mediated allergic asthma 2014  ? Obesity   ? Pelvic pain   ? Pregnancy, high-risk 1985  ? Stroke [facility]) 04/20/2021  ? (CVA) of right basal ganglia  ? Tonsillitis 1968  ? Uterine fibroid 1980   ? ? ?Past Surgical History:  ?Procedure Laterality Date  ? APPENDECTOMY  1959  ? BASCILIC VEIN TRANSPOSITION Right 04/30/2021  ? Procedure: RIGHT FIRST STAGE [place];  Surgeon: Angelia Mould, MD;  Location: [place];  Service: Vascular;  Laterality: Right;  ? [place]  ? COLONOSCOPY    ? ESOPHAGOGASTRODUODENOSCOPY (EGD) WITH PROPOFOL N/A 04/18/2021  ? Procedure: ESOPHAGOGASTRODUODENOSCOPY (EGD) WITH PROPOFOL;  Surgeon: Gatha Mayer, MD;  Location[facility] ENDOSCOPY;  Service: Endoscopy;  Laterality: N/A;  ? EYE SURGERY    ? bilateral cataract   ? FLEXIBLE SIGMOIDOSCOPY N/A 04/18/2021  ? Procedure: FLEXIBLE SIGMOIDOSCOPY;  Surgeon: Gatha Mayer, MD;  Location: [facility] ENDOSCOPY;  Service: Endoscopy;  Laterality: N/A;  ? HERNIA REPAIR  1959  ? IR FLUORO GUIDE CV LINE RIGHT  03/18/2021  ? IR US GUIDE VASC ACCESS RIGHT  03/18/2021  ? LEFT HEART CATH AND CORONARY ANGIOGRAPHY N/A 04/04/2017  ? Procedure: Left Heart Cath and Coronary Angiography;  Surgeon: Belva Crome, MD;  Location: [place] CV LAB;  Service: Cardiovascular;  Laterality: N/A;  ? MYOMECTOMY  1980,  2004, 2007  ? RHINOPLASTY  1971  ? ROTATOR CUFF REPAIR  2003  ? SURGICAL REPAIR OF HEMORRHAGE  2015  ? TONSILLECTOMY  1968  ? ? ?Vitals:  ? 02/15/22 1154 02/15/22 1230  ?BP: (!) 184/73 (!) 167/78  ?Pulse: 73 77  ? ? ? Subjective Assessment - 02/16/22 1743   ? ? Subjective Pt reports she will be having surgery on her Rt toe on Monday, May 1; states her foot hurts a litttle with weight bearing   ? Patient is accompained by: Family member   CNA  ? Pertinent History ESRD with HD M-W-Fri:, obesity, DM type 2, CAD, iron deficiency anemia, bronchial asthma, chronic diastolic CHF, Rt basal ganglia CVA on 04-16-21, steal syndrome RUE due to dialysis vascular access (s/p fistula placement), CKD stage V:  Rt hand weakness; Lt hemiparesis due to CVA   ? Patient Stated Goals "be independent with mobility and self care"; "be able to drive  again"   ? Currently in Pain? Yes   ? Pain Score 5    ? Pain Location Foot   ? Pain Orientation Right   ? Pain Descriptors / Indicators Aching;Discomfort   ? Pain Onset 1 to 4 weeks ago   ? Pain Frequency Intermittent   ? ?  ?  ? ?  ? ? ? ? ? ? ? ? ? ? ? ? ? ? ? ? ? ? ? ? [place] Adult PT Treatment/Exercise - 02/16/22 0001   ? ?  ? Transfers  ? Transfers Sit to Stand;Stand to Sit   ? Sit to Stand 5: Supervision   ? Stand to Sit 5: Supervision   ? Comments 5 reps without UE support from high/low mat table   ?  ? Ambulation/Gait  ? Ambulation/Gait Yes   ? Ambulation/Gait Assistance 5: Supervision   ? Ambulation Distance (Feet) 230 Feet   ? Assistive device Rolling walker   ? Gait Pattern Step-through pattern;Decreased hip/knee flexion - left;Decreased step length - left   ? Ambulation Surface Level;Indoor   ?  ? Knee/Hip Exercises: Aerobic  ? Recumbent Bike Scifit level 3.0 x 4" with UE's and LE's   ?  ? Knee/Hip Exercises: Standing  ? Hip Flexion Stengthening;Right;Left;1 set;10 reps   2# weight used  ? Hip Abduction Stengthening;Right;Left;1 set;10 reps   2# weight  ? Hip Extension Stengthening;1 set;10 reps   2# used  ? Forward Step Up Left;1 set;10 reps   ?  ? Knee/Hip Exercises: Seated  ? [employer] AROM;Strengthening;Both;1 set;10 reps;Weights;Limitations   2# weight used on each leg  ? ?  ?  ? ?  ? ? ? ? ? ? Balance Exercises - 02/16/22 0001   ? ?  ? Balance Exercises: Standing  ? Rockerboard Anterior/posterior;EO;10 reps;UE support   bil. UE support on // bars  ? Step Over Hurdles / [place]es stepping over black beam 5 reps each foot forward with UE support on // bars   ? Other Standing Exercises Tap ups to 1st step 5 reps each foot alternating with UE support on rails   ? ?  ?  ? ?  ? ? ? ? ? ? ? PT Short Term Goals - 02/16/22 1756   ? ?  ? PT SHORT TERM GOAL #1  ? Title Improve TUG score from 32 secs to </= 27 secs with RW with SBA to demo improved functional mobility. (all STGs due 12-17-21)   ? Baseline  11/18/21: 30.69 sec's  with RW, improved from 32 sec's just not to goals;   28.15 secs with RW   ? Status Not Met   ? Target Date 12/17/21   ?  ? PT SHORT TERM GOAL #2  ? Title Pt will perform 5 times sit to stand transfers from standard chair with only 1 UE support with SBA to demo increased LE strength and balance. Update:  Perform sit to stand without UE support from mat for 3 reps to demo improved LE strength.   ? Baseline 11/18/21: 17.94 sec's with single UE support from standard height surface with retropulstion noted with each rep, min guard assist for safety; met on 12-21-21 with no LOB upon initial standing   ? Status Achieved   ? Target Date 12/17/21   ?  ? PT SHORT TERM GOAL #3  ? Title Increase Berg balance test score from 13/30 to >/= 19/30 to demo improved balance.  Updated STG; increase score to >/= 38/56   ? Baseline 12/16/21: 40/56 scored today   ? Status Achieved   ?  ? PT SHORT TERM GOAL #4  ? Title Increase gait velocity by at least .4 ft/sec with RW to demo improved gait efficiency. Revised updated LTG:  increase gait velocity to >/= 2.0 ft/sec with RW   ? Baseline 11/02/21: 1.17 ft/sec with RW as baselne:    18.4, 18.82 = 1.74 ft/sec.   17.03 secs = 1.92 ft/sec   ? Time 4   ? Period Weeks   ? Status Not Met   ? Target Date 12/17/21   ?  ? PT SHORT TERM GOAL #5  ? Title Amb. 230' with RW with SBA on flat, even surface for increased community accessibility.   ? Baseline 12/16/21: met in session today   ? Status Achieved   ?  ? PT SHORT TERM GOAL #6  ? Title Independent with HEP for strengthening & balance exs.   ? Baseline 11/18/21: met with current program per pt and CNA   ? Status Achieved   ? Target Date 11/19/21   ? ?  ?  ? ?  ? ? ? ? PT Long Term Goals - 02/16/22 1756   ? ?  ? PT LONG TERM GOAL #1  ? Title Improve TUG score to </= 20 secs with RW with supervision to reduce fall risk.   ? Baseline 32 secs with RW;  29.56 secs with RW on 01-11-22   ? Time 5   ? Period Weeks   ? Status On-going   ?  Target Date 02/18/22   ?  ? PT LONG TERM GOAL #2  ? Title Pt will amb. 500' with RW with SBA on flat, even surface for increased community accessibility.   ? Baseline 55' with RW;   230' with RW on 01-11-22   ? Time 5   ?

## 2022-02-17 ENCOUNTER — Encounter: Payer: Self-pay | Admitting: Physical Therapy

## 2022-02-17 ENCOUNTER — Ambulatory Visit: Payer: Medicare PPO | Admitting: Physical Therapy

## 2022-02-17 DIAGNOSIS — R2689 Other abnormalities of gait and mobility: Secondary | ICD-10-CM

## 2022-02-17 DIAGNOSIS — I69354 Hemiplegia and hemiparesis following cerebral infarction affecting left non-dominant side: Secondary | ICD-10-CM | POA: Diagnosis not present

## 2022-02-17 DIAGNOSIS — R278 Other lack of coordination: Secondary | ICD-10-CM | POA: Diagnosis not present

## 2022-02-17 DIAGNOSIS — M25512 Pain in left shoulder: Secondary | ICD-10-CM | POA: Diagnosis not present

## 2022-02-17 DIAGNOSIS — R2681 Unsteadiness on feet: Secondary | ICD-10-CM | POA: Diagnosis not present

## 2022-02-17 DIAGNOSIS — R208 Other disturbances of skin sensation: Secondary | ICD-10-CM | POA: Diagnosis not present

## 2022-02-17 DIAGNOSIS — M6281 Muscle weakness (generalized): Secondary | ICD-10-CM | POA: Diagnosis not present

## 2022-02-17 DIAGNOSIS — M79641 Pain in right hand: Secondary | ICD-10-CM | POA: Diagnosis not present

## 2022-02-17 NOTE — Therapy (Signed)
[facility] ?[place], [name], [address] ?Phone: [phone]   Fax:  [phone] ? ?Physical Therapy Treatment & Discharge Summary ? ?Patient Details  ?Name: Carolyn Russo ?MRN: [id number] ?Date of Birth: May 20, 1951 ?Referring Provider (PT): Courtney Heys, MD ? ? ?Encounter Date: 02/17/2022 ? ? PT End of Session - 02/17/22 1951   ? ? Visit Number 26   ? Number of Visits 28   ? Date for PT Re-Evaluation 02/18/22   ? Authorization Type [employer] Medicare-auth submitted   ? Authorization Time Period 24 visits from 10/24/21- 01/14/22; renewal for [employer] requested for dates 01-13-22 - 02-18-22 for 10 additional visits   ? Authorization - Visit Number 8   ? Authorization - Number of Visits 10   ? Progress Note Due on Visit 29   ? PT Start Time 1147   ? PT Stop Time 1234   ? PT Time Calculation (min) 47 min   ? Equipment Utilized During Treatment Gait belt   ? Activity Tolerance Patient tolerated treatment well   ? Behavior During Therapy [facility] for tasks assessed/performed   ? ?  ?  ? ?  ? ? ?Past Medical History:  ?Diagnosis Date  ? Acute GI bleeding   ? Allergy   ? Anemia   ? Anterior chest wall pain   ? Appendicitis 1965  ? Asthma   ? Body mass index 37.0-37.9, adult   ? Breast pain   ? Cataract   ? both eyes  ? CHF (congestive heart failure) ([place])   ? Chronic kidney disease   ? stage 5 - on dialysis  ? Cognitive change 04/20/2021  ? r/t cva 03/2821  ? Dehydration 2014  ? Deviated septum 1971  ? Diabetes mellitus   ? no meds  ? Dyspnea 2014  ? Extrinsic asthma   ? WITH ASTHMA ATTACK  ? Fibroid 1980  ? GERD (gastroesophageal reflux disease)   ? Heart murmur   ? Hx gestational diabetes   ? Hyperlipidemia   ? Hypertension 2014  ? Inguinal hernia 1959  ? Malaise and fatigue 2014  ? Non-IgE mediated allergic asthma 2014  ? Obesity   ? Pelvic pain   ? Pregnancy, high-risk 1985  ? Stroke [facility]) 04/20/2021  ? (CVA) of right basal ganglia  ? Tonsillitis 1968  ? Uterine  fibroid 1980  ? ? ?Past Surgical History:  ?Procedure Laterality Date  ? APPENDECTOMY  1959  ? BASCILIC VEIN TRANSPOSITION Right 04/30/2021  ? Procedure: RIGHT FIRST STAGE [place];  Surgeon: Angelia Mould, MD;  Location: [place];  Service: Vascular;  Laterality: Right;  ? [place]  ? COLONOSCOPY    ? ESOPHAGOGASTRODUODENOSCOPY (EGD) WITH PROPOFOL N/A 04/18/2021  ? Procedure: ESOPHAGOGASTRODUODENOSCOPY (EGD) WITH PROPOFOL;  Surgeon: Gatha Mayer, MD;  Location: [facility] ENDOSCOPY;  Service: Endoscopy;  Laterality: N/A;  ? EYE SURGERY    ? bilateral cataract   ? FLEXIBLE SIGMOIDOSCOPY N/A 04/18/2021  ? Procedure: FLEXIBLE SIGMOIDOSCOPY;  Surgeon: Gatha Mayer, MD;  [facility] END[facility]COPY;  Service: Endoscopy;  Laterality: N/A;  ? HERNIA REPAIR  1959  ? IR FLUORO GUIDE CV LINE RIGHT  03/18/2021  ? IR US GUIDE VASC ACCESS RIGHT  03/18/2021  ? LEFT HEART CATH AND CORONARY ANGIOGRAPHY N/A 04/04/2017  ? Procedure: Left Heart Cath and Coronary Angiography;  Surgeon: Belva Crome, MD;  Location: [place] CV LAB;  Service: Cardiovascular;  Laterality: N/A;  ?  [place], 2004, 2007  ? RHINOPLASTY  1971  ? ROTATOR CUFF REPAIR  2003  ? SURGICAL REPAIR OF HEMORRHAGE  2015  ? TONSILLECTOMY  1968  ? ? ?There were no vitals filed for this visit. ? ? Subjective Assessment - 02/17/22 1946   ? ? Subjective Pt brings a rollator to today's session - states her husband wants to know if she is able to use it safely; pt states they like it because it has a seat on it that allows her to sit  when she gets tired   ? Patient is accompained by: Family member   CNA  ? Pertinent History ESRD with HD M-W-Fri:, obesity, DM type 2, CAD, iron deficiency anemia, bronchial asthma, chronic diastolic CHF, Rt basal ganglia CVA on 04-16-21, steal syndrome RUE due to dialysis vascular access (s/p fistula placement), CKD stage V:  Rt hand weakness; Lt hemiparesis due to CVA   ? Patient Stated Goals "be independent  with mobility and self care"; "be able to drive again"   ? Currently in Pain? Yes   ? Pain Score 6    ? Pain Location Foot   ? Pain Orientation Right   ? Pain Descriptors / Indicators Aching;Discomfort;Sore   ? Pain Type Chronic pain   ? Pain Onset 1 to 4 weeks ago   ? Pain Frequency Intermittent   ? Aggravating Factors  weight bearing - walking   ? Pain Relieving Factors non weight bearing   ? ?  ?  ? ?  ? ? ? ? ? [facility] PT Assessment - 02/17/22 0001   ? ?  ? Berg Balance Test  ? Sit to Stand Able to stand without using hands and stabilize independently   ? Standing Unsupported Able to stand 2 minutes with supervision   ? Sitting with Back Unsupported but Feet Supported on Floor or Stool Able to sit safely and securely 2 minutes   ? Stand to Sit Sits safely with minimal use of hands   ? Transfers Able to transfer safely, definite need of hands   ? Standing Unsupported with Eyes Closed Able to stand 10 seconds with supervision   ? Standing Unsupported with Feet Together Able to place feet together independently and stand for 1 minute with supervision   ? From Standing, Reach Forward with Outstretched Arm Can reach forward >12 cm safely (5")   8 inches  ? From Standing Position, Pick up Object from Floor Able to pick up shoe, needs supervision   ? From Standing Position, Turn to Look Behind Over each Shoulder Looks behind one side only/other side shows less weight shift   8.31 to Rt, 9.2 to Lt  ? Turn 360 Degrees Able to turn 360 degrees safely but slowly   ? Standing Unsupported, Alternately Place Feet on Step/Stool Able to complete >2 steps/needs minimal assist   ? Standing Unsupported, One Foot in Front Able to plae foot ahead of the other independently and hold 30 seconds   ? Standing on One Leg Tries to lift leg/unable to hold 3 seconds but remains standing independently   ? Total Score 40   ? Berg comment: 40/56 37-45 significant risk   ? ?  ?  ? ?  ? ? ? ? ? ? ? ? ? ? ? ? ? ? ? ? [place] Adult PT Treatment/Exercise  - 02/17/22 1214   ? ?  ? Transfers  ? Transfers Sit to Stand;Stand to Sit   ?  Sit to Stand 5: Supervision   from mat; pt uses LUE support when standing up from chair  ? Stand to Sit 5: Supervision   ? Comments 5 reps without UE support from high/low mat table   ?  ? Ambulation/Gait  ? Ambulation/Gait Yes   ? Ambulation/Gait Assistance 5: Supervision   ? Ambulation Distance (Feet) 230 Feet   ? Assistive device Rollator   ? Gait Pattern Step-through pattern;Decreased hip/knee flexion - left;Decreased step length - left   ? Ambulation Surface Level;Indoor   ? Gait velocity 2.0 ft/sec with rollator   ?  ? Timed Up and Go Test  ? TUG Normal TUG   ? Normal TUG (seconds) 24.56   with rollator  ? ?  ?  ? ?  ? ? ? ? ? ? ? ? ? ? ? ? PT Short Term Goals - 02/17/22 1952   ? ?  ? PT SHORT TERM GOAL #1  ? Title Improve TUG score from 32 secs to </= 27 secs with RW with SBA to demo improved functional mobility. (all STGs due 12-17-21)   ? Baseline 11/18/21: 30.69 sec's with RW, improved from 32 sec's just not to goals;   28.15 secs with RW   ? Status Not Met   ? [place] Date 12/17/21   ?  ? PT SHORT TERM GOAL #2  ? Title Pt will perform 5 times sit to stand transfers from standard chair with only 1 UE support with SBA to demo increased LE strength and balance. Update:  Perform sit to stand without UE support from mat for 3 reps to demo improved LE strength.   ? Baseline 11/18/21: 17.94 sec's with single UE support from standard height surface with retropulstion noted with each rep, min guard assist for safety; met on 12-21-21 with no LOB upon initial standing   ? Status Achieved   ? [place] Date 12/17/21   ?  ? PT SHORT TERM GOAL #3  ? Title Increase Berg balance test score from 13/30 to >/= 19/30 to demo improved balance.  Updated STG; increase score to >/= 38/56   ? Baseline 12/16/21: 40/56 scored today   ? Status Achieved   ?  ? PT SHORT TERM GOAL #4  ? Title Increase gait velocity by at least .4 ft/sec with RW to demo improved gait  efficiency. Revised updated LTG:  increase gait velocity to >/= 2.0 ft/sec with RW   ? Baseline 11/02/21: 1.17 ft/sec with RW as baselne:    18.4, 18.82 = 1.74 ft/sec.   17.03 secs = 1.92 ft/sec   ?

## 2022-02-19 DIAGNOSIS — N186 End stage renal disease: Secondary | ICD-10-CM | POA: Diagnosis not present

## 2022-02-19 DIAGNOSIS — N2581 Secondary hyperparathyroidism of renal origin: Secondary | ICD-10-CM | POA: Diagnosis not present

## 2022-02-19 DIAGNOSIS — Z992 Dependence on renal dialysis: Secondary | ICD-10-CM | POA: Diagnosis not present

## 2022-02-20 DIAGNOSIS — N186 End stage renal disease: Secondary | ICD-10-CM | POA: Diagnosis not present

## 2022-02-20 DIAGNOSIS — E1122 Type 2 diabetes mellitus with diabetic chronic kidney disease: Secondary | ICD-10-CM | POA: Diagnosis not present

## 2022-02-20 DIAGNOSIS — Z992 Dependence on renal dialysis: Secondary | ICD-10-CM | POA: Diagnosis not present

## 2022-02-21 ENCOUNTER — Other Ambulatory Visit: Payer: Self-pay

## 2022-02-21 ENCOUNTER — Encounter (HOSPITAL_COMMUNITY)
Admission: RE | Disposition: A | Discharge status: Home / Self Care | Payer: Self-pay | Source: Home / Self Care | Attending: Vascular Surgery

## 2022-02-21 ENCOUNTER — Ambulatory Visit (HOSPITAL_COMMUNITY)
Admission: RE | Admit: 2022-02-21 | Discharge: 2022-02-21 | Disposition: A | Discharge status: Home / Self Care | Payer: Medicare PPO | Attending: Vascular Surgery | Admitting: Vascular Surgery

## 2022-02-21 DIAGNOSIS — Z794 Long term (current) use of insulin: Secondary | ICD-10-CM | POA: Insufficient documentation

## 2022-02-21 DIAGNOSIS — E1122 Type 2 diabetes mellitus with diabetic chronic kidney disease: Secondary | ICD-10-CM | POA: Insufficient documentation

## 2022-02-21 DIAGNOSIS — E11621 Type 2 diabetes mellitus with foot ulcer: Secondary | ICD-10-CM | POA: Diagnosis not present

## 2022-02-21 DIAGNOSIS — Z992 Dependence on renal dialysis: Secondary | ICD-10-CM | POA: Insufficient documentation

## 2022-02-21 DIAGNOSIS — L97519 Non-pressure chronic ulcer of other part of right foot with unspecified severity: Secondary | ICD-10-CM | POA: Insufficient documentation

## 2022-02-21 DIAGNOSIS — I70261 Atherosclerosis of native arteries of extremities with gangrene, right leg: Secondary | ICD-10-CM | POA: Diagnosis not present

## 2022-02-21 DIAGNOSIS — Z7902 Long term (current) use of antithrombotics/antiplatelets: Secondary | ICD-10-CM | POA: Insufficient documentation

## 2022-02-21 DIAGNOSIS — I70235 Atherosclerosis of native arteries of right leg with ulceration of other part of foot: Secondary | ICD-10-CM | POA: Diagnosis not present

## 2022-02-21 DIAGNOSIS — E1152 Type 2 diabetes mellitus with diabetic peripheral angiopathy with gangrene: Secondary | ICD-10-CM | POA: Insufficient documentation

## 2022-02-21 DIAGNOSIS — N186 End stage renal disease: Secondary | ICD-10-CM | POA: Diagnosis not present

## 2022-02-21 DIAGNOSIS — I509 Heart failure, unspecified: Secondary | ICD-10-CM | POA: Insufficient documentation

## 2022-02-21 DIAGNOSIS — I132 Hypertensive heart and chronic kidney disease with heart failure and with stage 5 chronic kidney disease, or end stage renal disease: Secondary | ICD-10-CM | POA: Diagnosis not present

## 2022-02-21 HISTORY — PX: PERIPHERAL VASCULAR THROMBECTOMY: CATH118306

## 2022-02-21 HISTORY — PX: ABDOMINAL AORTOGRAM W/LOWER EXTREMITY: CATH118223

## 2022-02-21 LAB — POCT I-STAT, CHEM 8
BUN: 41 mg/dL — ABNORMAL HIGH (ref 8–23)
Calcium, Ion: 1.09 mmol/L — ABNORMAL LOW (ref 1.15–1.40)
Chloride: 103 mmol/L (ref 98–111)
Creatinine, Ser: 8.3 mg/dL — ABNORMAL HIGH (ref 0.44–1.00)
Glucose, Bld: 122 mg/dL — ABNORMAL HIGH (ref 70–99)
HCT: 28 % — ABNORMAL LOW (ref 36.0–46.0)
Hemoglobin: 9.5 g/dL — ABNORMAL LOW (ref 12.0–15.0)
Potassium: 4.5 mmol/L (ref 3.5–5.1)
Sodium: 141 mmol/L (ref 135–145)
TCO2: 28 mmol/L (ref 22–32)

## 2022-02-21 LAB — GLUCOSE, CAPILLARY
Glucose-Capillary: 131 mg/dL — ABNORMAL HIGH (ref 70–99)
Glucose-Capillary: 174 mg/dL — ABNORMAL HIGH (ref 70–99)

## 2022-02-21 SURGERY — ABDOMINAL AORTOGRAM W/LOWER EXTREMITY
Anesthesia: LOCAL

## 2022-02-21 MED ORDER — LABETALOL HCL 5 MG/ML IV SOLN
10.0000 mg | INTRAVENOUS | Status: DC | PRN
  Administered 2022-02-21: 10 mg via INTRAVENOUS
  Filled 2022-02-21: qty 4

## 2022-02-21 MED ORDER — OXYCODONE HCL 5 MG PO TABS: 5.0000 mg | ORAL_TABLET | ORAL | Status: DC | PRN

## 2022-02-21 MED ORDER — HEPARIN (PORCINE) IN NACL 1000-0.9 UT/500ML-% IV SOLN
INTRAVENOUS | Status: DC | PRN
Start: 2022-02-21 — End: 2022-02-21
  Administered 2022-02-21 (×2): 500 mL

## 2022-02-21 MED ORDER — LIDOCAINE HCL (PF) 1 % IJ SOLN
INTRAMUSCULAR | Status: DC | PRN
  Administered 2022-02-21: 15 mL

## 2022-02-21 MED ORDER — MIDAZOLAM HCL 2 MG/2ML IJ SOLN
INTRAMUSCULAR | Status: AC
  Filled 2022-02-21: qty 2

## 2022-02-21 MED ORDER — SODIUM CHLORIDE 0.9% FLUSH
3.0000 mL | INTRAVENOUS | Status: DC | PRN
Start: 2022-02-21 — End: 2022-02-21

## 2022-02-21 MED ORDER — FENTANYL CITRATE (PF) 100 MCG/2ML IJ SOLN
INTRAMUSCULAR | Status: AC
  Filled 2022-02-21: qty 2

## 2022-02-21 MED ORDER — METHYLPREDNISOLONE SODIUM SUCC 125 MG IJ SOLR
125.0000 mg | INTRAMUSCULAR | Status: AC
  Administered 2022-02-21: 125 mg via INTRAVENOUS
  Filled 2022-02-21: qty 2

## 2022-02-21 MED ORDER — SODIUM CHLORIDE 0.9 % IV SOLN: 250.0000 mL | INTRAVENOUS | Status: DC | PRN

## 2022-02-21 MED ORDER — HEPARIN (PORCINE) IN NACL 1000-0.9 UT/500ML-% IV SOLN
INTRAVENOUS | Status: AC
  Filled 2022-02-21: qty 1000

## 2022-02-21 MED ORDER — ACETAMINOPHEN 325 MG PO TABS: 650.0000 mg | ORAL_TABLET | ORAL | Status: DC | PRN

## 2022-02-21 MED ORDER — HEPARIN SODIUM (PORCINE) 1000 UNIT/ML IJ SOLN
INTRAMUSCULAR | Status: DC | PRN
  Administered 2022-02-21: 8000 [IU] via INTRAVENOUS

## 2022-02-21 MED ORDER — SODIUM CHLORIDE 0.9% FLUSH: 3.0000 mL | Freq: Two times a day (BID) | INTRAVENOUS | Status: DC

## 2022-02-21 MED ORDER — LIDOCAINE HCL (PF) 1 % IJ SOLN
INTRAMUSCULAR | Status: AC
  Filled 2022-02-21: qty 30

## 2022-02-21 MED ORDER — MIDAZOLAM HCL 2 MG/2ML IJ SOLN
INTRAMUSCULAR | Status: DC | PRN
  Administered 2022-02-21: 1 mg via INTRAVENOUS
  Administered 2022-02-21: 2 mg via INTRAVENOUS

## 2022-02-21 MED ORDER — FENTANYL CITRATE (PF) 100 MCG/2ML IJ SOLN
INTRAMUSCULAR | Status: DC | PRN
  Administered 2022-02-21 (×2): 25 ug via INTRAVENOUS

## 2022-02-21 MED ORDER — DIPHENHYDRAMINE HCL 50 MG/ML IJ SOLN
25.0000 mg | INTRAMUSCULAR | Status: AC
  Administered 2022-02-21: 25 mg via INTRAVENOUS
  Filled 2022-02-21: qty 1

## 2022-02-21 MED ORDER — ONDANSETRON HCL 4 MG/2ML IJ SOLN: 4.0000 mg | Freq: Four times a day (QID) | INTRAMUSCULAR | Status: DC | PRN

## 2022-02-21 MED ORDER — HYDRALAZINE HCL 20 MG/ML IJ SOLN: 5.0000 mg | INTRAMUSCULAR | Status: DC | PRN

## 2022-02-21 MED ORDER — SODIUM CHLORIDE 0.9% FLUSH: 3.0000 mL | INTRAVENOUS | Status: DC | PRN

## 2022-02-21 SURGICAL SUPPLY — 24 items
BAG SNAP BAND KOVER 36X36 (MISCELLANEOUS) ×1 IMPLANT
BALL STERLING OTW 2.5X150X150 (BALLOONS) ×3
BALLN STERLING OTW 2.5X150X150 (BALLOONS) ×2 IMPLANT
BALLOON STRLNG OTW 2.5X150X150 (BALLOONS) IMPLANT
CATH 0.018 NAVICROSS ANG 135 (CATHETERS) ×1 IMPLANT
CATH AURYON ATHERECTOMY 0.9 (CATHETERS) ×1 IMPLANT
CATH OMNI FLUSH 5F 65CM (CATHETERS) ×1 IMPLANT
CATH TEMPO AQUA 5F 100CM (CATHETERS) ×1 IMPLANT
CLOSURE MYNX CONTROL 6F/7F (Vascular Products) ×1 IMPLANT
GUIDEWIRE ANGLED .035X260CM (WIRE) ×1 IMPLANT
KIT ENCORE 26 ADVANTAGE (KITS) ×1 IMPLANT
KIT MICROPUNCTURE NIT STIFF (SHEATH) ×1 IMPLANT
KIT PV (KITS) ×4 IMPLANT
SHEATH PINNACLE 5F 10CM (SHEATH) ×1 IMPLANT
SHEATH PINNACLE 6F 10CM (SHEATH) ×1 IMPLANT
SHEATH PINNACLE ST 6F 65CM (SHEATH) ×1 IMPLANT
STENT SYNERGY XD 3.0X32 (Permanent Stent) ×1 IMPLANT
SYR MEDRAD MARK V 150ML (SYRINGE) ×1 IMPLANT
TRANSDUCER W/STOPCOCK (MISCELLANEOUS) ×4 IMPLANT
TRAY PV CATH (CUSTOM PROCEDURE TRAY) ×4 IMPLANT
WIRE BENTSON .035X145CM (WIRE) ×1 IMPLANT
WIRE G V18X300CM (WIRE) ×1 IMPLANT
WIRE ROSEN-J .035X260CM (WIRE) ×1 IMPLANT
WIRE SPARTACORE .014X300CM (WIRE) ×1 IMPLANT

## 2022-02-21 NOTE — Op Note (Signed)
? ? ?  Patient name: Carolyn Russo MRN: [id number] DOB: 11/16/1950 Sex: female ? ?02/21/2022 ?Pre-operative Diagnosis: esrd, chronic right lower extremity limb threatening ischemia with toe ulceration ?Post-operative diagnosis:  Same ?Surgeon:  Eda Paschal. [name], MD ?Procedure Performed: ?1.  Ultrasound-guided cannulation left common femoral artery ?2.  Aortogram with bilateral lower extremity runoff ?3.  Laser atherectomy right posterior tibial artery with 0.9 mm Auryon ?4.  Stent of left posterior tibial artery with 3 x 32 mm Synergy ?5.  Mynx device closure left common femoral artery ?6.  Moderate sedation with fentanyl and Versed for 78 minutes ? ? ?Indications: 71 year old female with end-stage renal disease with chronic right lower extremity limb threatening ischemia.  Wi-Fi is a deep ulcer with gangrene limited to toes, toe pressure of 36, noninfected equals Wi-Fi 4 ? ?Findings: Aorta and iliac segments are free of flow-limiting stenosis.  Bilateral common femoral and SFA and popliteal arteries are calcified with no flow-limiting stenosis.  Left lower extremity appears to have runoff via the peroneal artery although imaging was limited by patient mobility and timing.  The right lower extremity there was peroneal artery that was diseased and it ended at the ankle there was a posterior tibial artery that was occluded for approximately 20 cm this was reconstituted distally and did fill an arch in the foot.  After laser arthrectomy and balloon angioplasty there was dissection at the takeoff of the posterior tibial artery this was primarily stented to 0% residual stenosis where previously the posterior tibial artery was occluded and there is a brisk signal in the posterior tibial artery that can be traced onto the foot. ?  ?Procedure:  The patient was identified in the holding area and taken to room 8.  The patient was then placed supine on the table and prepped and draped in the usual sterile fashion.  A time out was  called.  Ultrasound was used to evaluate the left common femoral artery.  This was calcified although free of disease.  There is anesthetized 1% lidocaine cannulated micropuncture needle followed by wire sheath.  And images saved the permanent record.  We placed a Bentson wire followed by 5 [place] sheath.  Omni catheter was placed to the level of L1 aortogram was performed to the level of the renal arteries although they were not filling the endorgan consistent with need for dialysis.  We then performed bilateral lower extremity runoff with the above findings.  We then crossed the bifurcation with Glidewire and a straight catheter all the way to the popliteal artery performed completion angiography which demonstrated the above-noted findings.  A Rosen wire was placed followed by a long 6 French sheath into the right SFA and the patient was fully heparinized.  We used a V18 and a catheter to cross the occluded posterior tibial artery and then confirmed intraluminal access.  We exchanged for an 014 wire performed laser arthrectomy of the entire posterior tibial artery followed by balloon angioplasty which demonstrates dissection proximally.  This was primarily stented with a 3 x 32 mm drug-eluting stent.  Completion demonstrated no residual stenosis.  We exchanged for short 6 French sheath in the groin removed all the wires and deployed a minx device.  She tolerated procedure without any complication ? ?Contrast: 110cc ? ? ?Omkar Stratmann C. [name], MD ?[facility] ?Office: [phone] ?Pager: [phone] ? ? ?

## 2022-02-21 NOTE — Progress Notes (Signed)
?  ? ? ? ? ? ? ? ?  Carolyn Russo C. [name], MD ?[facility] ?Office: [phone] ?Pager: [phone] ? ?

## 2022-02-21 NOTE — Interval H&P Note (Signed)
History and Physical Interval Note: ? ?02/21/2022 ?10:16 AM ? ?Carolyn Russo  has presented today for surgery, with the diagnosis of critical limb ischemia right lower extremity.  The various methods of treatment have been discussed with the patient and family. After consideration of risks, benefits and other options for treatment, the patient has consented to  Procedure(s): ?ABDOMINAL AORTOGRAM W/LOWER EXTREMITY (N/A) as a surgical intervention.  The patient's history has been reviewed, patient examined, no change in status, stable for surgery.  I have reviewed the patient's chart and labs.  Questions were answered to the patient's satisfaction.   ? ? ?Servando Snare ? ? ?

## 2022-02-22 ENCOUNTER — Encounter (HOSPITAL_COMMUNITY): Payer: Self-pay | Admitting: Vascular Surgery

## 2022-02-23 ENCOUNTER — Other Ambulatory Visit: Payer: Self-pay

## 2022-02-23 DIAGNOSIS — Z992 Dependence on renal dialysis: Secondary | ICD-10-CM | POA: Diagnosis not present

## 2022-02-23 DIAGNOSIS — N186 End stage renal disease: Secondary | ICD-10-CM | POA: Diagnosis not present

## 2022-02-23 DIAGNOSIS — N2581 Secondary hyperparathyroidism of renal origin: Secondary | ICD-10-CM | POA: Diagnosis not present

## 2022-02-23 MED ORDER — METHYLPHENIDATE HCL 5 MG PO TABS: 5.0000 mg | ORAL_TABLET | Freq: Two times a day (BID) | ORAL | 0 refills | Status: DC

## 2022-02-23 NOTE — Telephone Encounter (Signed)
Filled  Written  ID  Drug  QTY  Days  Prescriber  RX #  Dispenser  Refill  Daily Dose*  Pymt Type  PMP  ?11/30/2021 11/30/2021 1  ?Methylphenidate 5 Mg Tablet ?60.00 30 Me Lov [id number] Wal [id number]) 0/0  Medicare [place] ?

## 2022-02-25 DIAGNOSIS — N186 End stage renal disease: Secondary | ICD-10-CM | POA: Diagnosis not present

## 2022-02-25 DIAGNOSIS — N2581 Secondary hyperparathyroidism of renal origin: Secondary | ICD-10-CM | POA: Diagnosis not present

## 2022-02-25 DIAGNOSIS — Z992 Dependence on renal dialysis: Secondary | ICD-10-CM | POA: Diagnosis not present

## 2022-02-28 DIAGNOSIS — N2581 Secondary hyperparathyroidism of renal origin: Secondary | ICD-10-CM | POA: Diagnosis not present

## 2022-02-28 DIAGNOSIS — N186 End stage renal disease: Secondary | ICD-10-CM | POA: Diagnosis not present

## 2022-02-28 DIAGNOSIS — Z992 Dependence on renal dialysis: Secondary | ICD-10-CM | POA: Diagnosis not present

## 2022-03-01 ENCOUNTER — Ambulatory Visit: Payer: Medicare PPO | Admitting: Podiatry

## 2022-03-02 DIAGNOSIS — E1165 Type 2 diabetes mellitus with hyperglycemia: Secondary | ICD-10-CM | POA: Diagnosis not present

## 2022-03-02 DIAGNOSIS — N2581 Secondary hyperparathyroidism of renal origin: Secondary | ICD-10-CM | POA: Diagnosis not present

## 2022-03-02 DIAGNOSIS — Z992 Dependence on renal dialysis: Secondary | ICD-10-CM | POA: Diagnosis not present

## 2022-03-02 DIAGNOSIS — N186 End stage renal disease: Secondary | ICD-10-CM | POA: Diagnosis not present

## 2022-03-03 ENCOUNTER — Ambulatory Visit (INDEPENDENT_AMBULATORY_CARE_PROVIDER_SITE_OTHER): Payer: Medicare PPO | Admitting: Podiatry

## 2022-03-03 DIAGNOSIS — I998 Other disorder of circulatory system: Secondary | ICD-10-CM

## 2022-03-03 DIAGNOSIS — M79671 Pain in right foot: Secondary | ICD-10-CM | POA: Diagnosis not present

## 2022-03-03 DIAGNOSIS — L97511 Non-pressure chronic ulcer of other part of right foot limited to breakdown of skin: Secondary | ICD-10-CM | POA: Diagnosis not present

## 2022-03-03 MED ORDER — TRAMADOL HCL 50 MG PO TABS: 50.0000 mg | ORAL_TABLET | Freq: Two times a day (BID) | ORAL | 0 refills | Status: DC

## 2022-03-04 ENCOUNTER — Encounter: Payer: Self-pay | Admitting: Physical Medicine and Rehabilitation

## 2022-03-04 ENCOUNTER — Encounter: Payer: Medicare PPO | Attending: Physical Medicine and Rehabilitation | Admitting: Physical Medicine and Rehabilitation

## 2022-03-04 VITALS — BP 127/91 | HR 81 | Ht 62.5 in | Wt 203.0 lb

## 2022-03-04 DIAGNOSIS — M79641 Pain in right hand: Secondary | ICD-10-CM

## 2022-03-04 DIAGNOSIS — G8194 Hemiplegia, unspecified affecting left nondominant side: Secondary | ICD-10-CM | POA: Diagnosis not present

## 2022-03-04 DIAGNOSIS — N2581 Secondary hyperparathyroidism of renal origin: Secondary | ICD-10-CM | POA: Diagnosis not present

## 2022-03-04 DIAGNOSIS — R29898 Other symptoms and signs involving the musculoskeletal system: Secondary | ICD-10-CM | POA: Diagnosis not present

## 2022-03-04 DIAGNOSIS — T82898S Other specified complication of vascular prosthetic devices, implants and grafts, sequela: Secondary | ICD-10-CM

## 2022-03-04 DIAGNOSIS — N186 End stage renal disease: Secondary | ICD-10-CM | POA: Diagnosis not present

## 2022-03-04 DIAGNOSIS — Z992 Dependence on renal dialysis: Secondary | ICD-10-CM | POA: Diagnosis not present

## 2022-03-04 MED ORDER — METHYLPHENIDATE HCL 5 MG PO TABS: 5.0000 mg | ORAL_TABLET | Freq: Two times a day (BID) | ORAL | 0 refills | Status: DC

## 2022-03-04 NOTE — Patient Instructions (Signed)
Pt is a 71 yr old female with recent hx of L hemiparesis due to R basal ganglia infarct in 7/22; R hip bursitis; anxiety; LGIB; dCHF; on HD from ESRD; HTN, BMI of 34 and DM- A1c 5.2- here for f/u on stroke and R hand weakness/numbness.  ?Also sedation with stroke.  ?S/p balloon/laser to increase blood flow to RLE due to wound.  ? ?To discuss reversing fistula since not using on RUE.  ? ?2.   Will refill Ritalin '5mg'$  2x/day for attention/alertness- sent in 3 months supply. Continue for lifetime- most likely.  ? ? ?3. Steal syndrome- on RUE- thinking about reversal of RUE fistula.  ? ? ?4. Voltaren gel is working OK- for nerve pain- made it less.  ?Also uses lidocaine cream .  ? ?5. Suggest getting back to gym-  and go at least 2x/week- and also improves social interaction.  ? ? ?6. If that doesn't work, I agree with exercise bike- has to push [name] to go without w/c.  ? ? ?7. Has to be out of w/c 2.5 hours/day and doing something- I suggest chair yoga.   ? ? ?8. Pedal bike- can use for arms or legs- at least 30 minutes/day.  ? ? ?9.  F/u 3 months.  ?

## 2022-03-04 NOTE — Progress Notes (Signed)
? ?Subjective:  ? ? Patient ID: Carolyn Russo, female    DOB: [birth year], 71 y.o.   MRN: [id number] ? ?HPI ? ?Pt is a 71 yr old female with recent hx of L hemiparesis due to R basal ganglia infarct in 7/22; R hip bursitis; anxiety; LGIB; dCHF; on HD from ESRD; HTN, BMI of 34 and DM- A1c 5.2- here for f/u on stroke and R hand weakness/numbness.  ?Also sedation with stroke.  ?  ? ?Went back on Ritalin- was more sedated/drowsy so went back on it.  ? ? ?Finished OT and PT ~ 2 weeks ago.  ?Progressed to using a RW more- while doing therapy.  ? ?Effort to get her to use RW at home.  ? ?Ulcer on R foot- big tue-  ?Podiatry ordered vascular studies- blockage- significant- to big toe. Flow was good up in leg.  ?Floow in LLE was great.  ?Saw Vascular surgeon- they did balloon and laser to increase flow.  ? ?Anterior flow completely blocked and narrow on posterior part of leg.  ?Wouldn't heal without blood flow - 2 weeks ago  ?Saw podiatry yesterday.  ?Large scab under big toe- removed part of nail that was elevated.  ? ?Stent of R post tibial artery and cannulation of L common femoral artery to get into vasculature.  ? ?Tired/irritable- had HD this AM.  ? ? ?R hand nerve pain- pain better lately with nerve pain.  ?Using voltaren gel 3-4x/day  (husband thinks 1x/day) and at night when gets up to bathroom.  ?Also heat with the cream.  ? ?Hasn't gotten the Home study for Peritoneal dialysis.  ?Needs to get HD catheter out soon- because it's been in 1 year.  ? ? ?Got splint for RUE- sleeps in it- helpful.  ? ?  ?Concerned about her going backwards since therapy has stopped.  ? ? ? ? ?Pain Inventory ?Average Pain 5 ?Pain Right Now 5 ?My pain is intermittent, tingling, and aching ? ?LOCATION OF PAIN  wrist, hand, toes ? ?BOWEL ?Number of stools per week: 5 ?Oral laxative use No  ?Type of laxative . ?Enema or suppository use No  ?History of colostomy No  ?Incontinent No  ? ?BLADDER ?Dialysis ?In and out cath, frequency . ?Able to  self cath  . ?Bladder incontinence No  ?Frequent urination No  ?Leakage with coughing No  ?Difficulty starting stream No  ?Incomplete bladder emptying No  ? ? ?Mobility ?walk with assistance ?use a walker ?ability to climb steps?  yes ?do you drive?  no ?use a wheelchair ?needs help with transfers ? ?Function ?I need assistance with the following:  feeding, dressing, bathing, toileting, meal prep, household duties, and shopping ? ?Neuro/Psych ?bladder control problems ?weakness ?trouble walking ?spasms ?dizziness ? ?Prior Studies ?Any changes since last visit?  no ? ?Physicians involved in your care ?Any changes since last visit?  no ? ? ?Family History  ?Problem Relation Age of Onset  ? Cancer Mother   ?     abdominal melamona  ? Psoriasis Mother   ? Alzheimer's disease Father   ? Cancer Cousin   ?     colon   ? Diabetes Cousin   ? Diabetes Maternal Aunt   ? Colon cancer Neg Hx   ? Colon polyps Neg Hx   ? Esophageal cancer Neg Hx   ? Rectal cancer Neg Hx   ? Stomach cancer Neg Hx   ? Breast cancer Neg Hx   ? ?Social History  ? ?  Socioeconomic History  ? Marital status: Married  ?  Spouse name: [name]  ? Number of children: 1  ? Years of education: Not on file  ? Highest education level: Professional school degree (e.g., MD, DDS, DVM, JD)  ?Occupational History  ? Occupation: retired  ?Tobacco Use  ? Smoking status: Never  ? Smokeless tobacco: Never  ?Vaping Use  ? Vaping Use: Never used  ?Substance and Sexual Activity  ? Alcohol use: No  ? Drug use: No  ? Sexual activity: Not Currently  ?  Birth control/protection: Post-menopausal  ?Other Topics Concern  ? Not on file  ?Social History Narrative  ? 06/21/21 lives with husband  ? Patient reports she used to walk at [employer] but now due to Covid-19 she is staying inside.  ? ?Social Determinants of Health  ? ?Financial Resource Strain: Low Risk   ? Difficulty of Paying Living Expenses: Not hard at all  ?Food Insecurity: No Food Insecurity  ? Worried About Charity fundraiser  in the Last Year: Never true  ? Ran Out of Food in the Last Year: Never true  ?Transportation Needs: No Transportation Needs  ? Lack of Transportation (Medical): No  ? Lack of Transportation (Non-Medical): No  ?Physical Activity: Inactive  ? Days of Exercise per Week: 0 days  ? Minutes of Exercise per Session: 0 min  ?Stress: Stress Concern Present  ? Feeling of Stress : To some extent  ?Social Connections: Not on file  ? ?Past Surgical History:  ?Procedure Laterality Date  ? ABDOMINAL AORTOGRAM W/LOWER EXTREMITY N/A 02/21/2022  ? Procedure: ABDOMINAL AORTOGRAM W/LOWER EXTREMITY;  Surgeon: Waynetta Sandy, MD;  Location: [place] CV LAB;  Service: Cardiovascular;  Laterality: N/A;  ? APPENDECTOMY  1959  ? BASCILIC VEIN TRANSPOSITION Right 04/30/2021  ? Procedure: RIGHT FIRST STAGE [place];  Surgeon: Angelia Mould, MD;  Location: [place];  Service: Vascular;  Laterality: Right;  ? [place]  ? COLONOSCOPY    ? ESOPHAGOGASTRODUODENOSCOPY (EGD) WITH PROPOFOL N/A 04/18/2021  ? Procedure: ESOPHAGOGASTRODUODENOSCOPY (EGD) WITH PROPOFOL;  Surgeon: Gatha Mayer, MD;  Location[facility] ENDOSCOPY;  Service: Endoscopy;  Laterality: N/A;  ? EYE SURGERY    ? bilateral cataract   ? FLEXIBLE SIGMOIDOSCOPY N/A 04/18/2021  ? Procedure: FLEXIBLE SIGMOIDOSCOPY;  Surgeon: Gatha Mayer, MD;  Location: [facility] ENDOSCOPY;  Service: Endoscopy;  Laterality: N/A;  ? HERNIA REPAIR  1959  ? IR FLUORO GUIDE CV LINE RIGHT  03/18/2021  ? IR US GUIDE VASC ACCESS RIGHT  03/18/2021  ? LEFT HEART CATH AND CORONARY ANGIOGRAPHY N/A 04/04/2017  ? Procedure: Left Heart Cath and Coronary Angiography;  Surgeon: Belva Crome, MD;  Location: [place] CV LAB;  Service: Cardiovascular;  Laterality: N/A;  ? [place], 2004, 2007  ? PERIPHERAL VASCULAR THROMBECTOMY  02/21/2022  ? Procedure: PERIPHERAL VASCULAR THROMBECTOMY;  Surgeon: Waynetta Sandy, MD;  Location: [place] CV LAB;  Service:  Cardiovascular;;  Right Tibial  ? RHINOPLASTY  1971  ? ROTATOR CUFF REPAIR  2003  ? SURGICAL REPAIR OF HEMORRHAGE  2015  ? TONSILLECTOMY  1968  ? ?Past Medical History:  ?Diagnosis Date  ? Acute GI bleeding   ? Allergy   ? Anemia   ? Anterior chest wall pain   ? Appendicitis 1965  ? Asthma   ? Body mass index 37.0-37.9, adult   ? Breast pain   ? Cataract   ? both eyes  ?  CHF (congestive heart failure) ([place])   ? Chronic kidney disease   ? stage 5 - on dialysis  ? Cognitive change 04/20/2021  ? r/t cva 03/2821  ? Dehydration 2014  ? Deviated septum 1971  ? Diabetes mellitus   ? no meds  ? Dyspnea 2014  ? Extrinsic asthma   ? WITH ASTHMA ATTACK  ? Fibroid 1980  ? GERD (gastroesophageal reflux disease)   ? Heart murmur   ? Hx gestational diabetes   ? Hyperlipidemia   ? Hypertension 2014  ? Inguinal hernia 1959  ? Malaise and fatigue 2014  ? Non-IgE mediated allergic asthma 2014  ? Obesity   ? Pelvic pain   ? Pregnancy, high-risk 1985  ? Stroke [facility]) 04/20/2021  ? (CVA) of right basal ganglia  ? Tonsillitis 1968  ? Uterine fibroid 1980  ? ?BP (!) 127/91   Pulse 81   Ht 5' 2.5" (1.588 m)   Wt 203 lb (92.1 kg)   SpO2 95%   BMI 36.54 kg/m?  ? ?Opioid Risk Score:   ?Fall Risk Score:  `1 ? ?Depression screen PHQ 2/9 ? ? ?  09/24/2021  ?  1:58 PM 09/02/2021  ? 12:26 PM 06/25/2021  ? 11:24 AM 11/26/2020  ?  3:13 PM 08/12/2020  ?  3:46 PM 06/18/2019  ?  2:03 PM 03/27/2019  ? 12:09 PM  ?Depression screen PHQ 2/9  ?Decreased Interest 1 0 1 0 0 0 0  ?Down, Depressed, Hopeless 1 0 1 0 0 0 0  ?PHQ - 2 Score 2 0 2 0 0 0 0  ?Altered sleeping   0    0  ?Tired, decreased energy   3    0  ?Change in appetite   0    0  ?Feeling bad or failure about yourself    0    0  ?Trouble concentrating   0    0  ?Moving slowly or fidgety/restless   0    0  ?Suicidal thoughts   0    0  ?PHQ-9 Score   5    0  ?Difficult doing work/chores   Somewhat difficult      ?  ? ?Review of Systems  ?Musculoskeletal:   ?     Hand, wrist, toes pain  ?All other systems  reviewed and are negative. ? ?   ?Objective:  ? Physical Exam ? ?Awake, alert, chewing gum in transport w/c; accompanied by husband, Dr [name]; NAD ?More talkative; interactive- less sedation ? ?MS:  ?LUE- 5-/5 in

## 2022-03-07 ENCOUNTER — Encounter (HOSPITAL_BASED_OUTPATIENT_CLINIC_OR_DEPARTMENT_OTHER): Payer: Self-pay | Admitting: Obstetrics and Gynecology

## 2022-03-07 ENCOUNTER — Emergency Department (HOSPITAL_BASED_OUTPATIENT_CLINIC_OR_DEPARTMENT_OTHER)
Admission: EM | Admit: 2022-03-07 | Discharge: 2022-03-07 | Disposition: A | Discharge status: Home / Self Care | Payer: Medicare PPO | Attending: Emergency Medicine | Admitting: Emergency Medicine

## 2022-03-07 ENCOUNTER — Emergency Department (HOSPITAL_BASED_OUTPATIENT_CLINIC_OR_DEPARTMENT_OTHER): Payer: Medicare PPO

## 2022-03-07 ENCOUNTER — Other Ambulatory Visit: Payer: Self-pay

## 2022-03-07 ENCOUNTER — Telehealth: Payer: Self-pay

## 2022-03-07 DIAGNOSIS — J45909 Unspecified asthma, uncomplicated: Secondary | ICD-10-CM | POA: Diagnosis not present

## 2022-03-07 DIAGNOSIS — Z79899 Other long term (current) drug therapy: Secondary | ICD-10-CM | POA: Insufficient documentation

## 2022-03-07 DIAGNOSIS — R112 Nausea with vomiting, unspecified: Secondary | ICD-10-CM | POA: Insufficient documentation

## 2022-03-07 DIAGNOSIS — D649 Anemia, unspecified: Secondary | ICD-10-CM | POA: Insufficient documentation

## 2022-03-07 DIAGNOSIS — N186 End stage renal disease: Secondary | ICD-10-CM | POA: Diagnosis not present

## 2022-03-07 DIAGNOSIS — E1022 Type 1 diabetes mellitus with diabetic chronic kidney disease: Secondary | ICD-10-CM | POA: Insufficient documentation

## 2022-03-07 DIAGNOSIS — Z7902 Long term (current) use of antithrombotics/antiplatelets: Secondary | ICD-10-CM | POA: Insufficient documentation

## 2022-03-07 DIAGNOSIS — R531 Weakness: Secondary | ICD-10-CM | POA: Insufficient documentation

## 2022-03-07 DIAGNOSIS — Z20822 Contact with and (suspected) exposure to covid-19: Secondary | ICD-10-CM | POA: Insufficient documentation

## 2022-03-07 DIAGNOSIS — D72829 Elevated white blood cell count, unspecified: Secondary | ICD-10-CM | POA: Diagnosis not present

## 2022-03-07 DIAGNOSIS — E1065 Type 1 diabetes mellitus with hyperglycemia: Secondary | ICD-10-CM | POA: Diagnosis not present

## 2022-03-07 DIAGNOSIS — R110 Nausea: Secondary | ICD-10-CM

## 2022-03-07 DIAGNOSIS — Z992 Dependence on renal dialysis: Secondary | ICD-10-CM | POA: Diagnosis not present

## 2022-03-07 DIAGNOSIS — R9431 Abnormal electrocardiogram [ECG] [EKG]: Secondary | ICD-10-CM | POA: Diagnosis not present

## 2022-03-07 DIAGNOSIS — Z794 Long term (current) use of insulin: Secondary | ICD-10-CM | POA: Diagnosis not present

## 2022-03-07 LAB — CBC WITH DIFFERENTIAL/PLATELET
Abs Immature Granulocytes: 0.07 10*3/uL (ref 0.00–0.07)
Basophils Absolute: 0.1 10*3/uL (ref 0.0–0.1)
Basophils Relative: 0 %
Eosinophils Absolute: 0.5 10*3/uL (ref 0.0–0.5)
Eosinophils Relative: 3 %
HCT: 23.5 % — ABNORMAL LOW (ref 36.0–46.0)
Hemoglobin: 7.8 g/dL — ABNORMAL LOW (ref 12.0–15.0)
Immature Granulocytes: 0 %
Lymphocytes Relative: 17 %
Lymphs Abs: 2.7 10*3/uL (ref 0.7–4.0)
MCH: 30.2 pg (ref 26.0–34.0)
MCHC: 33.2 g/dL (ref 30.0–36.0)
MCV: 91.1 fL (ref 80.0–100.0)
Monocytes Absolute: 1.6 10*3/uL — ABNORMAL HIGH (ref 0.1–1.0)
Monocytes Relative: 10 %
Neutro Abs: 11.3 10*3/uL — ABNORMAL HIGH (ref 1.7–7.7)
Neutrophils Relative %: 70 %
Platelets: 444 10*3/uL — ABNORMAL HIGH (ref 150–400)
RBC: 2.58 MIL/uL — ABNORMAL LOW (ref 3.87–5.11)
RDW: 15.9 % — ABNORMAL HIGH (ref 11.5–15.5)
WBC: 16.2 10*3/uL — ABNORMAL HIGH (ref 4.0–10.5)
nRBC: 0 % (ref 0.0–0.2)

## 2022-03-07 LAB — COMPREHENSIVE METABOLIC PANEL
ALT: 9 U/L (ref 0–44)
AST: 11 U/L — ABNORMAL LOW (ref 15–41)
Albumin: 3.9 g/dL (ref 3.5–5.0)
Alkaline Phosphatase: 69 U/L (ref 38–126)
Anion gap: 13 (ref 5–15)
BUN: 37 mg/dL — ABNORMAL HIGH (ref 8–23)
CO2: 29 mmol/L (ref 22–32)
Calcium: 9.1 mg/dL (ref 8.9–10.3)
Chloride: 98 mmol/L (ref 98–111)
Creatinine, Ser: 8.05 mg/dL — ABNORMAL HIGH (ref 0.44–1.00)
GFR, Estimated: 5 mL/min — ABNORMAL LOW (ref >60)
Glucose, Bld: 158 mg/dL — ABNORMAL HIGH (ref 70–99)
Potassium: 4.6 mmol/L (ref 3.5–5.1)
Sodium: 140 mmol/L (ref 135–145)
Total Bilirubin: 0.6 mg/dL (ref 0.3–1.2)
Total Protein: 7.8 g/dL (ref 6.5–8.1)

## 2022-03-07 LAB — RESP PANEL BY RT-PCR (FLU A&B, COVID) ARPGX2
Influenza A by PCR: NEGATIVE
Influenza B by PCR: NEGATIVE
SARS Coronavirus 2 by RT PCR: NEGATIVE

## 2022-03-07 LAB — LIPASE, BLOOD: Lipase: 39 U/L (ref 11–51)

## 2022-03-07 LAB — OCCULT BLOOD X 1 CARD TO LAB, STOOL: Fecal Occult Bld: NEGATIVE

## 2022-03-07 MED ORDER — ONDANSETRON 4 MG PO TBDP: 4.0000 mg | ORAL_TABLET | Freq: Three times a day (TID) | ORAL | 1 refills | Status: DC | PRN

## 2022-03-07 MED ORDER — PROCHLORPERAZINE EDISYLATE 10 MG/2ML IJ SOLN
5.0000 mg | Freq: Once | INTRAMUSCULAR | Status: AC
  Administered 2022-03-07: 5 mg via INTRAVENOUS
  Filled 2022-03-07: qty 2

## 2022-03-07 MED ORDER — ONDANSETRON HCL 4 MG/2ML IJ SOLN
4.0000 mg | Freq: Once | INTRAMUSCULAR | Status: AC
  Administered 2022-03-07: 4 mg via INTRAVENOUS
  Filled 2022-03-07: qty 2

## 2022-03-07 MED ORDER — SODIUM CHLORIDE 0.9 % IV BOLUS: 1000.0000 mL | Freq: Once | INTRAVENOUS | Status: DC

## 2022-03-07 NOTE — ED Provider Notes (Signed)
?[facility] ?Provider Note ? ? ?CSN: [id number] ?Arrival date & time: 03/07/22  4098 ? ?  ? ?History ? ?Chief Complaint  ?Patient presents with  ? Weakness  ? ? ?Carolyn Russo is a 71 y.o. female. ? ?Patient with history of diabetes, end-stage renal disease on hemodialysis, stroke left-sided weakness who presents to the ED with generalized weakness, nausea, vomiting.  Symptoms for the last several days.  Blood sugar has been normal.  History of diabetes on insulin.  Denies any chest pain, shortness of breath, abdominal pain.  Has had stuffy nose, trouble to breathe through her nose.  Currently dealing with a chronic right foot wound.  Was supposed to have dialysis today.  Last dialysis was Friday.  They have tried Zofran at home with minimal relief.  Nothing better.  Denies any suspicious food intake.  Poor p.o. intake the last several days. ? ? ? ?  ? ?Home Medications ?Prior to Admission medications   ?Medication Sig Start Date End Date Taking? Authorizing Provider  ?acetaminophen (TYLENOL) 500 MG tablet Take 1,000 mg by mouth 2 (two) times daily as needed (leg and arm pain).    [provider]  ?albuterol (VENTOLIN HFA) 108 (90 Base) MCG/ACT inhaler Inhale 2 puffs into the lungs every 4 (four) hours as needed for wheezing or shortness of breath. 08/18/21   Bary Castilla, NP  ?Camphor-Menthol-Methyl Sal (SALONPAS) 3.10-29-08 % PTCH Apply 1 application. topically daily as needed (Wrist pain).    [provider]  ?carvedilol (COREG) 3.125 MG tablet Take 1 tablet (3.125 mg total) by mouth 2 (two) times daily with a meal. 11/01/21   [name], MD  ?clopidogrel (PLAVIX) 75 MG tablet TAKE 1 TABLET(75 MG) BY MOUTH DAILY 02/11/22   [name], MD  ?Continuous Blood Gluc Receiver (FREESTYLE LIBRE 2 READER) DEVI by Does not apply route.    [provider]  ?diclofenac Sodium (VOLTAREN) 1 % GEL Apply 2 g topically 4 (four) times daily. To left wrist ?Patient  taking differently: Apply 2 g topically daily as needed (pain). To left wrist 05/26/21   Love, Ivan Anchors, PA-C  ?diphenhydrAMINE (BENADRYL) 2 % cream Apply 1 application. topically daily as needed for itching.    [provider]  ?diphenhydrAMINE (BENADRYL) 25 MG tablet Take 25 mg by mouth at bedtime.    [provider]  ?famotidine (PEPCID) 20 MG tablet Take 20 mg by mouth 2 (two) times daily as needed (Nausea).    [provider]  ?fluticasone (FLONASE) 50 MCG/ACT nasal spray Place 2 sprays into the nose daily as needed for allergies.     [provider]  ?hydrALAZINE (APRESOLINE) 10 MG tablet Take 1 tablet (10 mg total) by mouth every 8 (eight) hours as needed (Sys BP >180, hold on HD days). 05/26/21   Love, Ivan Anchors, PA-C  ?hydrocortisone (ANUSOL-HC) 2.5 % rectal cream Place 1 application rectally 2 (two) times daily as needed for hemorrhoids or anal itching. 04/20/21   Ghimire, Henreitta Leber, MD  ?hydrocortisone cream 1 % Apply to affected area 2 times daily 06/14/21 06/14/22  Minette Brine, FNP  ?hydrOXYzine (ATARAX/VISTARIL) 25 MG tablet Take 1 tablet (25 mg total) by mouth at bedtime as needed. 06/14/21   Minette Brine, FNP  ?insulin aspart (NOVOLOG) 100 UNIT/ML injection Inject 6-16 Units into the skin 3 (three) times daily before meals. Sliding scale ?100-150=6 units ?151-200=10 units ?201-250=12 units ?251-300=14 units ?301-350=16 units ?Greater than 350 = 16 units  ? ?  May take an additional 2 units at lunch of needed    [provider]  ?insulin glargine (LANTUS) 100 UNIT/ML Solostar Pen Inject 13 Units into the skin daily. ?Patient taking differently: Inject 12 Units into the skin at bedtime. Basaglar 08/18/21   Bary Castilla, NP  ?Insulin Pen Needle (BD PEN NEEDLE NANO 2ND GEN) 32G X 4 MM MISC See admin instructions. 07/15/21   [provider]  ?iron sucrose in sodium chloride 0.9 % 100 mL Inject 100 mg into the vein as needed (based on labs). 06/16/21    [provider]  ?levETIRAcetam (KEPPRA) 500 MG tablet TAKE 1 TABLET(500 MG) BY MOUTH AT BEDTIME 01/07/22   Lovorn, Jinny Blossom, MD  ?losartan (COZAAR) 50 MG tablet Take 50 mg by mouth 2 (two) times daily. 01/21/22   [provider]  ?melatonin 3 MG TABS tablet Take 1 tablet (3 mg total) by mouth at bedtime. ?Patient taking differently: Take 5 mg by mouth at bedtime. 05/26/21   Bary Leriche, PA-C  ?Methoxy PEG-Epoetin Beta (MIRCERA IJ) Mircera 07/21/21 07/20/22  [provider]  ?methylphenidate (RITALIN) 5 MG tablet Take 1 tablet (5 mg total) by mouth 2 (two) times daily with breakfast and lunch. Due to stroke attention 03/04/22   Lovorn, Jinny Blossom, MD  ?mometasone-formoterol (DULERA) 200-5 MCG/ACT AERO Inhale 2 puffs into the lungs 2 (two) times daily. ?Patient taking differently: Inhale 2 puffs into the lungs 2 (two) times daily as needed for wheezing or shortness of breath. 08/18/21   Bary Castilla, NP  ?mupirocin ointment (BACTROBAN) 2 % Apply 1 application. topically 2 (two) times daily. ?Patient taking differently: Apply 1 application. topically 2 (two) times daily. On her toe 02/01/22   Criselda Peaches, DPM  ?Nutritional Supplements (,FEEDING SUPPLEMENT, PROSOURCE PLUS) liquid Take 30 mLs by mouth 2 (two) times daily between meals. 05/27/21   Love, Ivan Anchors, PA-C  ?ondansetron (ZOFRAN) 4 MG tablet Take 1 tablet (4 mg total) by mouth every 6 (six) hours as needed for nausea or vomiting. 06/14/21 06/14/22  Minette Brine, FNP  ?ondansetron (ZOFRAN-ODT) 4 MG disintegrating tablet Take 1 tablet (4 mg total) by mouth every 8 (eight) hours as needed for nausea or vomiting. 03/07/22   Lennice Sites, DO  ?OneTouch Delica Lancets 16X MISC Use as directed to check blood sugars before breakfast and dinner dx: e11.65 08/18/21   Bary Castilla, NP  ?pantoprazole (PROTONIX) 40 MG tablet Take 1 tablet (40 mg total) by mouth daily. 02/07/22   [name], MD  ?phenylephrine (,USE FOR PREPARATION-H,) 0.25 %  suppository Place 1 suppository rectally 2 (two) times daily. ?Patient taking differently: Place 1 suppository rectally 2 (two) times daily as needed for hemorrhoids. 06/14/21   Minette Brine, FNP  ?polyethylene glycol powder (GLYCOLAX/MIRALAX) 17 GM/SCOOP powder Take 17 g by mouth daily. 08/18/21   Bary Castilla, NP  ?rosuvastatin (CRESTOR) 20 MG tablet Take 1 tablet (20 mg total) by mouth daily. 08/18/21   Bary Castilla, NP  ?senna-docusate (SENOKOT-S) 8.6-50 MG tablet Take 2 tablets by mouth 2 (two) times daily. ?Patient taking differently: Take 2 tablets by mouth daily as needed for mild constipation or moderate constipation. 06/29/21   Bary Castilla, NP  ?sevelamer carbonate (RENVELA) 0.8 g PACK packet Take 0.8 g by mouth 3 (three) times daily with meals. 10/19/21   [provider]  ?traMADol (ULTRAM) 50 MG tablet Take 1 tablet (50 mg total) by mouth every 12 (twelve) hours for 5 days. 03/03/22 03/08/22  Criselda Peaches,  DPM  ?iron polysaccharides (NIFEREX) 150 MG capsule Take 1 capsule (150 mg total) by mouth daily. ?Patient not taking: No sig reported 03/24/21 04/16/21  Nita Sells, MD  ?   ? ?Allergies    ?Food, Wheat bran, Statins, Iodine, Shellfish allergy, Sitagliptin, Tetracycline, and Contrast media [iodinated contrast media]   ? ?Review of Systems   ?Review of Systems ? ?Physical Exam ?Updated Vital Signs ?BP (!) 171/73 (BP Location: Left Arm)   Pulse 76   Temp 98.3 ?F (36.8 ?C) (Oral)   Resp 20   Ht 5' 2.5" (1.588 m)   Wt 94 kg   SpO2 98%   BMI 37.30 kg/m?  ?Physical Exam ?Vitals and nursing note reviewed.  ?Constitutional:   ?   General: She is not in acute distress. ?   Appearance: She is well-developed. She is not ill-appearing.  ?HENT:  ?   Head: Normocephalic and atraumatic.  ?   Nose: Nose normal.  ?   Mouth/Throat:  ?   Mouth: Mucous membranes are moist.  ?Eyes:  ?   Extraocular Movements: Extraocular movements intact.  ?   Conjunctiva/sclera: Conjunctivae  normal.  ?   Pupils: Pupils are equal, round, and reactive to light.  ?Cardiovascular:  ?   Rate and Rhythm: Normal rate and regular rhythm.  ?   Pulses: Normal pulses.  ?   Heart sounds: Normal heart sounds. No murmu

## 2022-03-07 NOTE — ED Triage Notes (Signed)
Patient reports to the ER for weakness and asthma exacerbation. Patient reports she feels weak and like she could pass out. Patient reports she feels like she is having trouble breathing. O2 95% on RA ?

## 2022-03-07 NOTE — ED Notes (Signed)
Discharge instructions, follow up care, and prescriptions reviewed and explained, pt verbalized understanding. Pt dressed and assisted to wheelchair and taken to vehicle. Pt in husbands care at departure.  ?

## 2022-03-07 NOTE — Progress Notes (Signed)
?Subjective:  ?Patient ID: Carolyn Russo, female    DOB: Mar 24, 1951,  MRN: [id number] ? ?Chief Complaint  ?Patient presents with  ? Wound Check  ?  wound check- skin ulcer to right great toe. Tender to touch. Has been applying mupirocin ointment.   ? ? ?71 y.o. female presents with the above complaint. History confirmed with patient.  She presents swelling of her right great toe there is a small blister they noticed after traveling and wearing a different type of shoe ? ?Interval history: ?She returns for follow-up she underwent angiography on 02/21/2022 with Dr. [name] she says that it is starting to feel better its been painful ? ?Objective:  ?Physical Exam: ?Foot is warm and the ankles to cool at the toes, weakly palpable PT pulse, there is dark discoloration of the hallux with a small scab at the distal tip, perfusion seems improved, no exposed bone or subcutaneous tissue, seems to be limited to breakdown of skin, no cellulitis or purulence.   ? ? ? ? ? ?ABI Findings:  ?+---------+------------------+-----+----------+--------+  ?Right    Rt Pressure (mmHg)IndexWaveform  Comment   ?+---------+------------------+-----+----------+--------+  ?Brachial 192                                        ?+---------+------------------+-----+----------+--------+  ?PTA      191               0.99 monophasic          ?+---------+------------------+-----+----------+--------+  ?PERO     191               0.99 monophasic          ?+---------+------------------+-----+----------+--------+  ?DP       178               0.93 monophasic          ?+---------+------------------+-----+----------+--------+  ?Great Toe36                0.19 Abnormal            ?+---------+------------------+-----+----------+--------+  ? ?+---------+------------------+-----+----------+-------+  ?Left     Lt Pressure (mmHg)IndexWaveform  Comment  ?+---------+------------------+-----+----------+-------+  ?Brachial 183                                        ?+---------+------------------+-----+----------+-------+  ?PTA                             monophasic         ?+---------+------------------+-----+----------+-------+  ?PERO                            monophasic         ?+---------+------------------+-----+----------+-------+  ?DP                              monophasic         ?+---------+------------------+-----+----------+-------+  ?Great Toe102               0.53 Abnormal           ?+---------+------------------+-----+----------+-------+  ? ?+-------+----------------+-----------+------------+------------+  ?ABI/TBIToday's ABI     Today's TBIPrevious ABIPrevious TBI  ?+-------+----------------+-----------+------------+------------+  ?Right  .99 (  unreliable).19                                  ?+-------+----------------+-----------+------------+------------+  ?Left   unattainable    .53                                  ?+-------+----------------+-----------+------------+------------+  ? ?   ? ? ?TOES Findings:  ?+----------+---------------+--------+-------------------------+  ?Right ToesPressure (mmHg)WaveformComment                    ?+----------+---------------+--------+-------------------------+  ?1st Digit                AbnormalSevere, nearly flat lined  ?+----------+---------------+--------+-------------------------+  ?2nd Digit                AbnormalSevere, nearly flat lined  ?+----------+---------------+--------+-------------------------+  ?3rd Digit                AbnormalModerate                   ?+----------+---------------+--------+-------------------------+  ?4th Digit                AbnormalModerate to Severe         ?+----------+---------------+--------+-------------------------+  ?5th Digit                AbnormalSevere, absent             ?+----------+---------------+--------+-------------------------+  ? ?    ? ?+---------+---------------+--------+----------------+  ?Left ToesPressure (mmHg)WaveformComment           ?+---------+---------------+--------+----------------+  ?1st Digit               AbnormalMild to Moderate  ?+---------+---------------+--------+----------------+  ?2nd Digit               AbnormalMild              ?+---------+---------------+--------+----------------+  ?3rd Digit               AbnormalMild              ?+---------+---------------+--------+----------------+  ?4th Digit               AbnormalMild              ?+---------+---------------+--------+----------------+  ?5th Digit               AbnormalMild to Moderate  ?+---------+---------------+--------+----------------+  ? ?   ? ?   ?Arterial wall calcification precludes accurate ankle pressures and ABIs of  ?the right lower extremity.  ? ?Unable to obtain accurate pressure readings in the left lower extremity  ?due to involuntary foot movements.  ?   ?Summary:  ?Right:  ?Although ankle brachial indices are within normal limits (0.95-1.29),  ?arterial Doppler waveforms at the ankle suggest some component of arterial  ?occlusive disease.  ?Left:  ? ?Abnormal monophasic waveforms.  ? ?Unable to obtain accurate pressure readings in the left lower extremity  ?due to involuntary foot movements.  ?Assessment:  ? ?1. Skin ulcer of right great toe, limited to breakdown of skin ([place])   ?2. Ischemic pain of foot, right   ? ? ? ? ?Plan:  ?Patient was evaluated and treated and all questions answered. ? ?Clinically seems to have some improvement in the foot after revascularization.  I recommend continue local wound care with mupirocin application daily.  I will see her back in 3 to  4 weeks for follow-up, we discussed signs symptoms of worsening infection she will let me know if these develop at all.  It has been very painful for her I did send her a prescription for tramadol which she will take only as needed. ? ?No follow-ups  on file.  ? ?

## 2022-03-08 ENCOUNTER — Telehealth: Payer: Self-pay

## 2022-03-08 DIAGNOSIS — Z992 Dependence on renal dialysis: Secondary | ICD-10-CM | POA: Diagnosis not present

## 2022-03-08 DIAGNOSIS — N186 End stage renal disease: Secondary | ICD-10-CM | POA: Diagnosis not present

## 2022-03-08 DIAGNOSIS — N2581 Secondary hyperparathyroidism of renal origin: Secondary | ICD-10-CM | POA: Diagnosis not present

## 2022-03-08 NOTE — Telephone Encounter (Signed)
Transition Care Management Unsuccessful Follow-up Telephone Call ? ?Date of discharge and from where:  03/07/2022 ? ?Attempts:  1st Attempt ? ?Reason for unsuccessful TCM follow-up call:  Left voice message ? ?  ?

## 2022-03-08 NOTE — Telephone Encounter (Signed)
Chmg-error.  

## 2022-03-09 DIAGNOSIS — N2581 Secondary hyperparathyroidism of renal origin: Secondary | ICD-10-CM | POA: Diagnosis not present

## 2022-03-09 DIAGNOSIS — N186 End stage renal disease: Secondary | ICD-10-CM | POA: Diagnosis not present

## 2022-03-09 DIAGNOSIS — Z992 Dependence on renal dialysis: Secondary | ICD-10-CM | POA: Diagnosis not present

## 2022-03-11 ENCOUNTER — Telehealth: Payer: Self-pay

## 2022-03-11 DIAGNOSIS — N2581 Secondary hyperparathyroidism of renal origin: Secondary | ICD-10-CM | POA: Diagnosis not present

## 2022-03-11 DIAGNOSIS — N186 End stage renal disease: Secondary | ICD-10-CM | POA: Diagnosis not present

## 2022-03-11 DIAGNOSIS — Z992 Dependence on renal dialysis: Secondary | ICD-10-CM | POA: Diagnosis not present

## 2022-03-11 NOTE — Telephone Encounter (Signed)
Transition Care Management Unsuccessful Follow-up Telephone Call  Date of discharge and from where:  03/07/2022 [facility]     Attempts:  2nd Attempt  Reason for unsuccessful TCM follow-up call:  Unable to leave message

## 2022-03-14 ENCOUNTER — Encounter (HOSPITAL_COMMUNITY): Payer: Self-pay

## 2022-03-14 DIAGNOSIS — N2581 Secondary hyperparathyroidism of renal origin: Secondary | ICD-10-CM | POA: Diagnosis not present

## 2022-03-14 DIAGNOSIS — Z992 Dependence on renal dialysis: Secondary | ICD-10-CM | POA: Diagnosis not present

## 2022-03-14 DIAGNOSIS — N186 End stage renal disease: Secondary | ICD-10-CM | POA: Diagnosis not present

## 2022-03-16 ENCOUNTER — Other Ambulatory Visit: Payer: Self-pay | Admitting: Podiatry

## 2022-03-16 DIAGNOSIS — N186 End stage renal disease: Secondary | ICD-10-CM | POA: Diagnosis not present

## 2022-03-16 DIAGNOSIS — Z992 Dependence on renal dialysis: Secondary | ICD-10-CM | POA: Diagnosis not present

## 2022-03-16 DIAGNOSIS — N2581 Secondary hyperparathyroidism of renal origin: Secondary | ICD-10-CM | POA: Diagnosis not present

## 2022-03-17 NOTE — Telephone Encounter (Signed)
Pts husband called stating pt is having a lot of pain with her foot and needs her tramadol  called in please. Verified pharmacy.

## 2022-03-18 ENCOUNTER — Telehealth: Payer: Self-pay

## 2022-03-18 DIAGNOSIS — N186 End stage renal disease: Secondary | ICD-10-CM | POA: Diagnosis not present

## 2022-03-18 DIAGNOSIS — Z992 Dependence on renal dialysis: Secondary | ICD-10-CM | POA: Diagnosis not present

## 2022-03-18 DIAGNOSIS — N2581 Secondary hyperparathyroidism of renal origin: Secondary | ICD-10-CM | POA: Diagnosis not present

## 2022-03-18 NOTE — Telephone Encounter (Signed)
Pts husband, Dr. [name], called and left a voice message.  Pt has pain in her right foot and right leg

## 2022-03-18 NOTE — Telephone Encounter (Signed)
Left message that the rx was sent to the pharmacy and if any issues to call.

## 2022-03-21 DIAGNOSIS — Z992 Dependence on renal dialysis: Secondary | ICD-10-CM | POA: Diagnosis not present

## 2022-03-21 DIAGNOSIS — N2581 Secondary hyperparathyroidism of renal origin: Secondary | ICD-10-CM | POA: Diagnosis not present

## 2022-03-21 DIAGNOSIS — N186 End stage renal disease: Secondary | ICD-10-CM | POA: Diagnosis not present

## 2022-03-23 DIAGNOSIS — Z992 Dependence on renal dialysis: Secondary | ICD-10-CM | POA: Diagnosis not present

## 2022-03-23 DIAGNOSIS — N2581 Secondary hyperparathyroidism of renal origin: Secondary | ICD-10-CM | POA: Diagnosis not present

## 2022-03-23 DIAGNOSIS — N186 End stage renal disease: Secondary | ICD-10-CM | POA: Diagnosis not present

## 2022-03-23 DIAGNOSIS — E1122 Type 2 diabetes mellitus with diabetic chronic kidney disease: Secondary | ICD-10-CM | POA: Diagnosis not present

## 2022-03-24 ENCOUNTER — Other Ambulatory Visit: Payer: Self-pay | Admitting: *Deleted

## 2022-03-24 DIAGNOSIS — I739 Peripheral vascular disease, unspecified: Secondary | ICD-10-CM

## 2022-03-25 ENCOUNTER — Telehealth: Payer: Self-pay | Admitting: Podiatry

## 2022-03-25 DIAGNOSIS — N186 End stage renal disease: Secondary | ICD-10-CM | POA: Diagnosis not present

## 2022-03-25 DIAGNOSIS — N2581 Secondary hyperparathyroidism of renal origin: Secondary | ICD-10-CM | POA: Diagnosis not present

## 2022-03-25 DIAGNOSIS — Z992 Dependence on renal dialysis: Secondary | ICD-10-CM | POA: Diagnosis not present

## 2022-03-25 NOTE — Telephone Encounter (Signed)
Per husband patient needs refill on pain medication  traMADol (ULTRAM) 50 MG tablet , please resend to [facility] on [address] .  Please advise

## 2022-03-28 DIAGNOSIS — N2581 Secondary hyperparathyroidism of renal origin: Secondary | ICD-10-CM | POA: Diagnosis not present

## 2022-03-28 DIAGNOSIS — Z992 Dependence on renal dialysis: Secondary | ICD-10-CM | POA: Diagnosis not present

## 2022-03-28 DIAGNOSIS — N186 End stage renal disease: Secondary | ICD-10-CM | POA: Diagnosis not present

## 2022-03-28 MED ORDER — TRAMADOL HCL 50 MG PO TABS: ORAL_TABLET | ORAL | 0 refills | Status: DC

## 2022-03-29 ENCOUNTER — Ambulatory Visit (INDEPENDENT_AMBULATORY_CARE_PROVIDER_SITE_OTHER): Payer: Medicare PPO | Admitting: Podiatry

## 2022-03-29 ENCOUNTER — Ambulatory Visit (HOSPITAL_COMMUNITY)
Admission: RE | Admit: 2022-03-29 | Discharge: 2022-03-29 | Disposition: A | Discharge status: Home / Self Care | Payer: Medicare PPO | Source: Ambulatory Visit | Attending: Vascular Surgery | Admitting: Vascular Surgery

## 2022-03-29 DIAGNOSIS — I739 Peripheral vascular disease, unspecified: Secondary | ICD-10-CM | POA: Insufficient documentation

## 2022-03-29 DIAGNOSIS — M79671 Pain in right foot: Secondary | ICD-10-CM | POA: Diagnosis not present

## 2022-03-29 DIAGNOSIS — I998 Other disorder of circulatory system: Secondary | ICD-10-CM | POA: Diagnosis not present

## 2022-03-29 NOTE — Progress Notes (Signed)
Subjective:  Patient ID: Carolyn Russo, female    DOB: [birth year],  MRN: [id number]  Chief Complaint  Patient presents with   Wound Check    wound check .    71 y.o. female presents with the above complaint. History confirmed with patient.  She presents swelling of her right great toe there is a small blister they noticed after traveling and wearing a different type of shoe  Interval history: She returns for follow-up they have been dressing it with mupirocin ointment as directed.  Feels like it is gotten worse been very painful.  She has taken the tramadol as needed sometimes twice a day but usually once  Objective:  Physical Exam: Foot is warm and the ankles to cool at the toes, weakly palpable PT pulse, appearance of the toe has worsened and she has been developed dry gangrene.  There is no purulence malodor or cellulitis        ABI and ultrasound completed this morning 03/29/2022    ABI Findings:  +---------+------------------+-----+----------+----------------------------  ----+  Right    Rt Pressure (mmHg)IndexWaveform  Comment                            +---------+------------------+-----+----------+----------------------------  ----+  PTA      254               1.31 monophasicsounded better than could  record  +---------+------------------+-----+----------+----------------------------  ----+  PERO     254               1.31 monophasic                                    +---------+------------------+-----+----------+----------------------------  ----+  DP       254               1.31 monophasic                                    +---------+------------------+-----+----------+----------------------------  ----+  Great Toe0                 0.00 Absent                                        +---------+------------------+-----+----------+----------------------------  ----+    +---------+------------------+-----+-------------------+-------+  Left     Lt Pressure (mmHg)IndexWaveform           Comment  +---------+------------------+-----+-------------------+-------+  Brachial 194                                                +---------+------------------+-----+-------------------+-------+  PTA      254               1.31 dampened monophasic         +---------+------------------+-----+-------------------+-------+  PERO     254                    triphasic                   +---------+------------------+-----+-------------------+-------+  DP  173               0.89 triphasic                   +---------+------------------+-----+-------------------+-------+  Great Toe70                0.36 Abnormal                    +---------+------------------+-----+-------------------+-------+   +-------+-----------+-----------+-----------------+------------+  ABI/TBIToday's ABIToday's TBIPrevious ABI     Previous TBI  +-------+-----------+-----------+-----------------+------------+  Right  [place]         0          0.99 (unreliable)0.19          +-------+-----------+-----------+-----------------+------------+  Left   [place]         0.36       unobtainable     0.53          +-------+-----------+-----------+-----------------+------------+  Assessment:   1. Ischemic pain of foot, right   2. Peripheral arterial disease ([place])        Plan:  Patient was evaluated and treated and all questions answered.  Unfortunately his seem to have worsened.  She had vascular testing completed this morning.  It looks like her TBI is decreased and is now 0.  She is due to see Dr. [name] tomorrow and I will discuss her case with him.  I expect she likely will lose the toe at this point.  I discussed this with the patient and her husband as well.  I will discuss with Dr. [name] what her healing potential if we should proceed with toe amputation  or if further revascularization options exist.  For now continue local wound care switch to Betadine paint daily to allow tissue to continue demarcate and keep clean  Return in about 1 week (around 04/05/2022) for wound check .

## 2022-03-29 NOTE — Patient Instructions (Signed)
Change the dressing on your right great toe daily. Use betadine, gauze and paper tape.

## 2022-03-30 ENCOUNTER — Encounter (HOSPITAL_COMMUNITY): Payer: Medicare PPO

## 2022-03-30 ENCOUNTER — Ambulatory Visit (INDEPENDENT_AMBULATORY_CARE_PROVIDER_SITE_OTHER): Payer: Medicare PPO | Admitting: Vascular Surgery

## 2022-03-30 ENCOUNTER — Encounter: Payer: Self-pay | Admitting: Vascular Surgery

## 2022-03-30 VITALS — BP 174/84 | HR 75 | Temp 98.2°F | Resp 20 | Ht 62.5 in | Wt 207.0 lb

## 2022-03-30 DIAGNOSIS — Z992 Dependence on renal dialysis: Secondary | ICD-10-CM | POA: Diagnosis not present

## 2022-03-30 DIAGNOSIS — N2581 Secondary hyperparathyroidism of renal origin: Secondary | ICD-10-CM | POA: Diagnosis not present

## 2022-03-30 DIAGNOSIS — I739 Peripheral vascular disease, unspecified: Secondary | ICD-10-CM

## 2022-03-30 DIAGNOSIS — N186 End stage renal disease: Secondary | ICD-10-CM | POA: Diagnosis not present

## 2022-03-30 NOTE — Progress Notes (Signed)
Patient ID: Carolyn Russo, female   DOB: 23-Oct-1951, 71 y.o.   MRN: [id number]  Reason for Consult: No chief complaint on file.   Referred by [name], MD  Subjective:     HPI:  Carolyn Russo is a 71 y.o. female history of right upper extremity AV fistula has not been used.  Recently underwent right posterior tibial artery stenting after athrectomy for right great toe ulceration.  She now follows up for evaluation of recent endovascular revascularization.  Past Medical History:  Diagnosis Date   Acute GI bleeding    Allergy    Anemia    Anterior chest wall pain    Appendicitis 1965   Asthma    Body mass index 37.0-37.9, adult    Breast pain    Cataract    both eyes   CHF (congestive heart failure) ([place])    Chronic kidney disease    stage 5 - on dialysis   Cognitive change 04/20/2021   r/t cva 03/2821   Dehydration 2014   Deviated septum 1971   Diabetes mellitus    no meds   Dyspnea 2014   Extrinsic asthma    WITH ASTHMA ATTACK   Fibroid 1980   GERD (gastroesophageal reflux disease)    Heart murmur    Hx gestational diabetes    Hyperlipidemia    Hypertension 2014   Inguinal hernia 1959   Malaise and fatigue 2014   Non-IgE mediated allergic asthma 2014   Obesity    Pelvic pain    Pregnancy, high-risk 1985   Stroke ([place]) 04/20/2021   (CVA) of right basal ganglia   Tonsillitis 1968   Uterine fibroid 1980   Family History  Problem Relation Age of Onset   Cancer Mother        abdominal melamona   Psoriasis Mother    Alzheimer's disease Father    Cancer Cousin        colon    Diabetes Cousin    Diabetes Maternal Aunt    Colon cancer Neg Hx    Colon polyps Neg Hx    Esophageal cancer Neg Hx    Rectal cancer Neg Hx    Stomach cancer Neg Hx    Breast cancer Neg Hx    Past Surgical History:  Procedure Laterality Date   ABDOMINAL AORTOGRAM W/LOWER EXTREMITY N/A 02/21/2022   Procedure: ABDOMINAL AORTOGRAM W/LOWER EXTREMITY;  Surgeon: Waynetta Sandy, MD;  Location: [place] [facility];  Service: Cardiovascular;  Laterality: N/A;   APPENDECTOMY  0865   BASCILIC VEIN TRANSPOSITION Right 04/30/2021   Procedure: RIGHT FIRST STAGE [place];  Surgeon: Angelia Mould, MD;  Location: [place] [facility];  Service: Vascular;  Laterality: Right;   CESAREAN SECTION  1985   COLONOSCOPY     ESOPHAGOGASTRODUODENOSCOPY (EGD) WITH PROPOFOL N/A 04/18/2021   Procedure: ESOPHAGOGASTRODUODENOSCOPY (EGD) WITH PROPOFOL;  Surgeon: Gatha Mayer, MD;  Location: [place];  Service: Endoscopy;  Laterality: N/A;   EYE SURGERY     bilateral cataract    FLEXIBLE SIGMOIDOSCOPY N/A 04/18/2021   Procedure: FLEXIBLE SIGMOIDOSCOPY;  Surgeon: Gatha Mayer, MD;  Location: [facility];  Service: Endoscopy;  Laterality: N/A;   HERNIA REPAIR  1959   IR FLUORO GUIDE CV LINE RIGHT  03/18/2021   IR US GUIDE VASC ACCESS RIGHT  03/18/2021   LEFT HEART CATH AND CORONARY ANGIOGRAPHY N/A 04/04/2017   Procedure: Left Heart Cath and Coronary Angiography;  Surgeon: Belva Crome, MD;  Location: [place];  Service: Cardiovascular;  Laterality: N/A;   [place], 2004, 2007   PERIPHERAL VASCULAR THROMBECTOMY  02/21/2022   Procedure: PERIPHERAL VASCULAR THROMBECTOMY;  Surgeon: Waynetta Sandy, MD;  Location: [place] [facility];  Service: Cardiovascular;;  Right Tibial   RHINOPLASTY  1971   ROTATOR CUFF REPAIR  2003   SURGICAL REPAIR OF HEMORRHAGE  2015   TONSILLECTOMY  1968    Short Social History:  Social History   Tobacco Use   Smoking status: Never   Smokeless tobacco: Never  Substance Use Topics   Alcohol use: No    Allergies  Allergen Reactions   Food Anaphylaxis    Peanuts; Almonds   Wheat Bran Shortness Of Breath    Other reaction(s): shortness of breath, itching   Statins Itching and Other (See Comments)    Generalized aches Other reaction(s): aches   Iodine Other (See Comments)    The patient denied this  allergy to me on 03/16/2021 Other reaction(s): Unknown   Shellfish Allergy Other (See Comments)    Mouth gets raw   Sitagliptin Other (See Comments)    Januiva   Tetracycline Other (See Comments)    Raw mouth   Contrast Media [Iodinated Contrast Media] Rash    Happened during CT scan over 30 years ago    Current Outpatient Medications  Medication Sig Dispense Refill   acetaminophen (TYLENOL) 500 MG tablet Take 1,000 mg by mouth 2 (two) times daily as needed (leg and arm pain).     albuterol (VENTOLIN HFA) 108 (90 Base) MCG/ACT inhaler Inhale 2 puffs into the lungs every 4 (four) hours as needed for wheezing or shortness of breath. 18 g 3   Camphor-Menthol-Methyl Sal (SALONPAS) 3.10-29-08 % PTCH Apply 1 application. topically daily as needed (Wrist pain).     carvedilol (COREG) 3.125 MG tablet Take 1 tablet (3.125 mg total) by mouth 2 (two) times daily with a meal. 180 tablet 1   clopidogrel (PLAVIX) 75 MG tablet TAKE 1 TABLET(75 MG) BY MOUTH DAILY 90 tablet 1   Continuous Blood Gluc Receiver (FREESTYLE LIBRE 2 READER) DEVI by Does not apply route.     diclofenac Sodium (VOLTAREN) 1 % GEL Apply 2 g topically 4 (four) times daily. To left wrist (Patient taking differently: Apply 2 g topically daily as needed (pain). To left wrist) 150 g 0   diphenhydrAMINE (BENADRYL) 2 % cream Apply 1 application. topically daily as needed for itching.     diphenhydrAMINE (BENADRYL) 25 MG tablet Take 25 mg by mouth at bedtime.     famotidine (PEPCID) 20 MG tablet Take 20 mg by mouth 2 (two) times daily as needed (Nausea).     fluticasone (FLONASE) 50 MCG/ACT nasal spray Place 2 sprays into the nose daily as needed for allergies.      hydrALAZINE (APRESOLINE) 10 MG tablet Take 1 tablet (10 mg total) by mouth every 8 (eight) hours as needed (Sys BP >180, hold on HD days). 90 tablet 0   hydrocortisone (ANUSOL-HC) 2.5 % rectal cream Place 1 application rectally 2 (two) times daily as needed for hemorrhoids or anal  itching. 30 g 1   hydrocortisone cream 1 % Apply to affected area 2 times daily 30 g 1   hydrOXYzine (ATARAX/VISTARIL) 25 MG tablet Take 1 tablet (25 mg total) by mouth at bedtime as needed. 30 tablet 2   insulin aspart (NOVOLOG) 100 UNIT/ML injection Inject 6-16 Units into the skin 3 (three) times  daily before meals. Sliding scale 100-150=6 units 151-200=10 units 201-250=12 units 251-300=14 units 301-350=16 units Greater than 350 = 16 units   May take an additional 2 units at lunch of needed     insulin glargine (LANTUS) 100 UNIT/ML Solostar Pen Inject 13 Units into the skin daily. (Patient taking differently: Inject 12 Units into the skin at bedtime. Basaglar) 15 mL 2   Insulin Pen Needle (BD PEN NEEDLE NANO 2ND GEN) 32G X 4 MM MISC See admin instructions.     iron sucrose in sodium chloride 0.9 % 100 mL Inject 100 mg into the vein as needed (based on labs).     levETIRAcetam (KEPPRA) 500 MG tablet TAKE 1 TABLET(500 MG) BY MOUTH AT BEDTIME 90 tablet 1   losartan (COZAAR) 50 MG tablet Take 50 mg by mouth 2 (two) times daily.     melatonin 3 MG TABS tablet Take 1 tablet (3 mg total) by mouth at bedtime. (Patient taking differently: Take 5 mg by mouth at bedtime.) 30 tablet 0   Methoxy PEG-Epoetin Beta (MIRCERA IJ) Mircera     methylphenidate (RITALIN) 5 MG tablet Take 1 tablet (5 mg total) by mouth 2 (two) times daily with breakfast and lunch. Due to stroke attention 180 tablet 0   mometasone-formoterol (DULERA) 200-5 MCG/ACT AERO Inhale 2 puffs into the lungs 2 (two) times daily. (Patient taking differently: Inhale 2 puffs into the lungs 2 (two) times daily as needed for wheezing or shortness of breath.) 3 each 2   mupirocin ointment (BACTROBAN) 2 % Apply 1 application. topically 2 (two) times daily. (Patient taking differently: Apply 1 application. topically 2 (two) times daily. On her toe) 60 g 2   Nutritional Supplements (,FEEDING SUPPLEMENT, PROSOURCE PLUS) liquid Take 30 mLs by mouth 2  (two) times daily between meals. 887 mL 0   ondansetron (ZOFRAN) 4 MG tablet Take 1 tablet (4 mg total) by mouth every 6 (six) hours as needed for nausea or vomiting. 30 tablet 0   ondansetron (ZOFRAN-ODT) 4 MG disintegrating tablet Take 1 tablet (4 mg total) by mouth every 8 (eight) hours as needed for nausea or vomiting. 20 tablet 1   OneTouch Delica Lancets 50P MISC Use as directed to check blood sugars before breakfast and dinner dx: e11.65 100 each 11   pantoprazole (PROTONIX) 40 MG tablet Take 1 tablet (40 mg total) by mouth daily. 90 tablet 2   phenylephrine (,USE FOR PREPARATION-H,) 0.25 % suppository Place 1 suppository rectally 2 (two) times daily. (Patient taking differently: Place 1 suppository rectally 2 (two) times daily as needed for hemorrhoids.) 12 suppository 0   polyethylene glycol powder (GLYCOLAX/MIRALAX) 17 GM/SCOOP powder Take 17 g by mouth daily. 510 g 0   rosuvastatin (CRESTOR) 20 MG tablet Take 1 tablet (20 mg total) by mouth daily. 90 tablet 2   senna-docusate (SENOKOT-S) 8.6-50 MG tablet Take 2 tablets by mouth 2 (two) times daily. (Patient taking differently: Take 2 tablets by mouth daily as needed for mild constipation or moderate constipation.) 360 tablet 2   sevelamer carbonate (RENVELA) 0.8 g PACK packet Take 0.8 g by mouth 3 (three) times daily with meals.     traMADol (ULTRAM) 50 MG tablet TAKE 1 TABLET(50 MG) BY MOUTH EVERY 12 HOURS FOR 5 DAYS 10 tablet 0   No current facility-administered medications for this visit.    Review of Systems  Constitutional:  Constitutional negative. HENT: HENT negative.  Eyes: Eyes negative.  Respiratory: Respiratory negative.  Cardiovascular: Cardiovascular negative.  GI: Gastrointestinal negative.  Musculoskeletal: Positive for gait problem.       Right hand improving Skin: Positive for wound.  Hematologic: Hematologic/lymphatic negative.  Psychiatric: Psychiatric negative.       Objective:  Objective   Vitals:    03/30/22 1620  BP: (!) 174/84  Pulse: 75  Resp: 20  Temp: 98.2 F (36.8 C)  SpO2: 93%    Physical Exam HENT:     Head: Normocephalic.  Eyes:     Pupils: Pupils are equal, round, and reactive to light.  Cardiovascular:     Pulses:          Popliteal pulses are 2+ on the right side and 2+ on the left side.     Comments: Monophasic very strong right posterior tibial signal Abdominal:     General: Abdomen is flat.     Palpations: Abdomen is soft.  Neurological:     General: No focal deficit present.     Mental Status: She is alert.  Psychiatric:        Mood and Affect: Mood normal.        Thought Content: Thought content normal.    Data: +----------+--------+-----+---------------+---------+--------+  RIGHT     PSV cm/sRatioStenosis       Waveform Comments  +----------+--------+-----+---------------+---------+--------+  CFA Prox  149                         triphasic          +----------+--------+-----+---------------+---------+--------+  CFA Distal135                         biphasic           +----------+--------+-----+---------------+---------+--------+  DFA       95                          biphasic           +----------+--------+-----+---------------+---------+--------+  SFA Prox  106                         triphasic          +----------+--------+-----+---------------+---------+--------+  SFA Mid   109                         triphasic          +----------+--------+-----+---------------+---------+--------+  SFA Distal76                          triphasic          +----------+--------+-----+---------------+---------+--------+  POP Prox  104                         triphasic          +----------+--------+-----+---------------+---------+--------+  POP Distal120                         triphasic          +----------+--------+-----+---------------+---------+--------+  TP Trunk  126                          triphasic          +----------+--------+-----+---------------+---------+--------+  PTA Prox  95  triphasic          +----------+--------+-----+---------------+---------+--------+  PTA Mid   174     1.5  30-49% stenosistriphasic          +----------+--------+-----+---------------+---------+--------+  PTA Distal67                          biphasic           +----------+--------+-----+---------------+---------+--------+   A focal velocity elevation of 174 cm/s was obtained at mid posterior  tibial artery with a VR of 1.5. Findings are characteristic of 30-49%  stenosis. Unable to accurately assess stent due to heavily calcified  vessel walls and patient constant movement  therefore velocities are listed in the native table.          Summary:  Right: No significant stenosis identified in the common femoral, femoral  or popliteal arteries. The posterior tibial artery appears patent, s/p  atherectomy and stenting with 30-49% stenosis in the mid segment.   ABI Findings:  +---------+------------------+-----+----------+----------------------------  ----+  Right    Rt Pressure (mmHg)IndexWaveform  Comment                            +---------+------------------+-----+----------+----------------------------  ----+  PTA      254               1.31 monophasicsounded better than could  record  +---------+------------------+-----+----------+----------------------------  ----+  PERO     254               1.31 monophasic                                    +---------+------------------+-----+----------+----------------------------  ----+  DP       254               1.31 monophasic                                    +---------+------------------+-----+----------+----------------------------  ----+  Great Toe0                 0.00 Absent                                         +---------+------------------+-----+----------+----------------------------  ----+   +---------+------------------+-----+-------------------+-------+  Left     Lt Pressure (mmHg)IndexWaveform           Comment  +---------+------------------+-----+-------------------+-------+  Brachial 194                                                +---------+------------------+-----+-------------------+-------+  PTA      254               1.31 dampened monophasic         +---------+------------------+-----+-------------------+-------+  PERO     254                    triphasic                   +---------+------------------+-----+-------------------+-------+  DP  173               0.89 triphasic                   +---------+------------------+-----+-------------------+-------+  Great Toe70                0.36 Abnormal                    +---------+------------------+-----+-------------------+-------+   +-------+-----------+-----------+-----------------+------------+  ABI/TBIToday's ABIToday's TBIPrevious ABI     Previous TBI  +-------+-----------+-----------+-----------------+------------+  Right  [place]         0          0.99 (unreliable)0.19          +-------+-----------+-----------+-----------------+------------+  Left            0.36       unobtainable     0.53          +-------+-----------+-----------+-----------------+------------+      Arterial wall calcification precludes accurate ankle pressures and ABIs.     Summary:  Right: Resting right ankle-brachial index indicates noncompressible right  lower extremity arteries. The right toe-brachial index is abnormal  (absent).   Left: Resting left ankle-brachial index indicates noncompressible left  lower extremity arteries. The left toe-brachial index is abnormal.      Assessment/Plan:    71 year old female status post right lower extremity revascularization now she has a toe  pressure of 0 but I think this is likely due to ongoing necrosis of the toe.  She really has a great signal to the level of the ankle and the skin of the foot appears well perfused.  She is probably at her best chance to heal a toe amputation at this time and I will relay this to her podiatrist.  I will see her back in 3 months with right lower extremity noninvasive studies and we can also discuss right arm fistula superficialization at that time.     Waynetta Sandy MD [facility]

## 2022-03-31 ENCOUNTER — Encounter: Payer: Self-pay | Admitting: Adult Health

## 2022-03-31 ENCOUNTER — Ambulatory Visit (INDEPENDENT_AMBULATORY_CARE_PROVIDER_SITE_OTHER): Payer: Medicare PPO | Admitting: Adult Health

## 2022-03-31 VITALS — BP 187/95 | Ht 62.5 in

## 2022-03-31 DIAGNOSIS — I69398 Other sequelae of cerebral infarction: Secondary | ICD-10-CM

## 2022-03-31 DIAGNOSIS — I639 Cerebral infarction, unspecified: Secondary | ICD-10-CM | POA: Diagnosis not present

## 2022-03-31 DIAGNOSIS — R569 Unspecified convulsions: Secondary | ICD-10-CM | POA: Diagnosis not present

## 2022-03-31 NOTE — Patient Instructions (Signed)
Continue keppra for seizure prevention  Ensure you follow up with your PCP to further discuss nausea concerns  Continue to follow with Dr. Dagoberto Ligas as scheduled   Continue clopidogrel 75 mg daily  and Crestor  for secondary stroke prevention  Continue to follow up with PCP regarding cholesterol, blood pressure and diabetes management  Maintain strict control of hypertension with blood pressure goal below 130/90, diabetes with hemoglobin A1c goal below 7.0 % and cholesterol with LDL cholesterol (bad cholesterol) goal below 70 mg/dL.   Signs of a Stroke? Follow the BEFAST method:  Balance Watch for a sudden loss of balance, trouble with coordination or vertigo Eyes Is there a sudden loss of vision in one or both eyes? Or double vision?  Face: Ask the person to smile. Does one side of the face droop or is it numb?  Arms: Ask the person to raise both arms. Does one arm drift downward? Is there weakness or numbness of a leg? Speech: Ask the person to repeat a simple phrase. Does the speech sound slurred/strange? Is the person confused ? Time: If you observe any of these signs, call 911.     Followup in the future with me in 6 months or call earlier if needed       Thank you for coming to see [place] at [facility]. I hope we have been able to provide you high quality care today.  You may receive a patient satisfaction survey over the next few weeks. We would appreciate your feedback and comments so that we may continue to improve ourselves and the health of our patients.

## 2022-03-31 NOTE — Progress Notes (Signed)
[facility] [address] [place]. [place] [address] [phone]       STROKE FOLLOW UP NOTE  [name] Date of Birth:  September 02, 1951 Medical Record Number:  [id number]   Reason for Referral: stroke follow up    SUBJECTIVE:   CHIEF COMPLAINT:  Chief Complaint  Patient presents with   Ischemic stroke    Rm 3, 6 month FU  caregiver - Carolyn Russo  "no new concerns"     HPI:   Update 03/31/2022 JM: Patient returns for 59-monthstroke follow-up accompanied by her caregiver KZigmund Russo Overall stable from stroke standpoint, denies new stroke/TIA symptoms Reports residual left-sided weakness which has been relatively stable since prior visit Completed therapies back in April, use of RW for ambulation, denies any recent falls Continues to closely follow with PMR  Compliant on Keppra and denies any recent seizure activity.  Denies side effects.  Compliant on Plavix and Crestor, denies side effects.  Blood pressure today elevated, routinely monitored at HD, typically more elevated on nondialysis days.  Continued RUE weakness and pain post fistula placement.  She continues to remain hesitant of removing fistula although it is not currently being used.  Pain currently being managed by PMR.  Continues to receive HD 3x weekly. Following with vascular surgery with recent right posterior tib artery stenting after arthrectomy for right great toe ulceration  No new neurological concerns today     History provided for reference purposes only Update 09/27/2021 JM: Returns for 361-monthtroke follow-up accompanied by her husband.  Overall stable from stroke standpoint -denies new stroke/TIA symptoms Residual mild left-sided weakness but overall greatly improving since prior visit.  Does have continued left shoulder pain with limited ROM.  Ambulates short distance with RW -denies any recent falls.  Receives aide assistance 5x/week while husband at works - he cares for her in the  evenings/nights and on weekends. Completed HH PT/OT last month- currently waiting to schedule PT/OT neuro rehab Remains on Plavix and Crestor -denies side effects Blood pressure today 150/70  Compliant on Keppra -denies side effects.  No seizure activity  Continued right hand weakness and sensory impairment with dysesthesias s/p fistula placement. Does report some improvement since prior visit. Plans on trying mix of lidocaine and voltaren gel as recommended by PMR at recent visit. Followed by [facility] who recommended taking fistula back down but she is still hesitant to undergo surgery.  Currently waiting to be scheduled for second opinion  Continued HD thrice weekly - c/o nausea during sessions   No new concerns at this time  Initial visit 06/21/2021 JM: Carolyn Russo being seen for hospital follow-up accompanied by her husband, Carolyn Russo Overall doing well.  Reports improvement of left-sided weakness [facility] and just started OT last week. She does c/o left shoulder pain with limited ROM. Ambulates short distance with RW. Does have aide assistance for ADLs.  Greatest concern today is in regards to right hand weakness s/p fistula placement. Seen by [facility] 8/17 again offering medication of the fistula and then planning new access in the future but she remains hesitant at this time to undergo any further surgical interventions. Recommended hand therapy for possible improvement.  Denies new stroke/TIA symptoms.  Remains on aspirin and Plavix as well as Crestor without side effects.  Blood pressure today 144/80. Remains on keppra '500mg'$  nightly without seizure activity and tolerating without side effects.  Continued HD thrice weekly.  No further concerns at this time.  Stroke admission 04/16/2021 Carolyn Russo is a 71 y.o. female with PMHx of obesity, DM, HTN, HLD, ESRD on HD who presented with left sided weakness for 3 days on 04/16/2021. Personally reviewed hospitalization pertinent progress notes,  lab work and imaging. Evaluated by Dr. Erlinda Russo for subcortical infarct on the right, likely secondary to small vessel disease. LDL 70. A1c 5.2. MRA head/neck unremarkable. EF 50% on 02/2021. on aspirin PTA and recommended DAPT for 3 weeks then Plavix alone. Continue Coreg, Crestor '20mg'$  daily and Novolog. Other stroke risk factors include advanced age and obesity. Other active problems include ESRD on HD, chronic hip pain, asthma and GERD. Residual deficits of left hemiparesis. Eval by therapies recommended CIR and dc'd to CIR on 04/20/2021. Initally recovering well, however, on 7/13 she was found to have AMS, worsening left-sided weakness, left hemineglect and right gaze deviation after HD session. MR brain and CT head no evidence of acute infarct.  EEG showed right central parietal region spikes, consistent with potential epileptogenic focus. Started on Keppra '500mg'$  nightly for likely seizure with postictal state. Underwent right basilic vein fistula placement by vascular surgery on 7/8 and subsequently developed right hand numbness and weakness.  Evaluated by neurology Dr. 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 VVS recommended ligation of fistula but pt declined. She returned back to CIR on on 7/15 and dc'd home on 8/4.     Pertinent imaging  MR BRAIN 04/16/2021 IMPRESSION: 1. 2.1 cm acute ischemic nonhemorrhagic right basal ganglia infarct. 2. Underlying moderate chronic microvascular ischemic disease.  MR ANGIO HEAD WO CONTRAST MR ANGIO NECK WO CONTRAST 04/17/2021 IMPRESSION: 1. Technically limited exam due to extensive motion artifact. 2. Grossly negative MRA of the head and neck. No large vessel occlusion. No proximal high-grade or correctable stenosis.  CT HEAD 05/03/2021 IMPRESSION: No acute CT finding. Subacute infarction in the right lateral thalamus/radiating white matter tracts is more clearly visible but there is no sign of extension or  hemorrhage. Chronic small-vessel ischemic changes elsewhere affecting the cerebral hemispheric white matter.  CT HEAD 05/05/2021 IMPRESSION: 1. No acute intracranial pathology. 2. Age-related atrophy and chronic microvascular ischemic changes. Similar appearance of focal area of old infarct lateral to the right thalamus.  CTA HEAD/NECK 05/05/2021 CT PERFUSION IMPRESSION: 1. Negative CTA for emergent large vessel occlusion. 2. Mild atheromatous change about the major arterial vasculature of the head and neck as above. No proximal high-grade or correctable stenosis. 3. 2 mm focal outpouching arising from the proximal cavernous right ICA, which could reflect a small aneurysm versus vascular infundibulum. Attention at follow-up recommended. 4. CT perfusion portion of this exam unfortunately failed due to technical difficulty. No perfusion maps were generated.  MR BRAIN 05/06/2021 IMPRESSION: No acute infarction. Evolving prior infarction of the right corona radiata and basal ganglia. Chronic microvascular ischemic changes.  EEG 05/06/2021 IMPRESSION: This study showed evidence of potential epileptogenicity arising from right centro-parietal region. There is also cortical dysfunction arising from right hemisphere likely secondary to underlying stroke. Additionally, there is mild diffuse encephalopathy, nonspecific etiology. No seizures were seen throughout the recording.      ROS:   14 system review of systems performed and negative with exception of those listed in HPI  PMH:  Past Medical History:  Diagnosis Date   Acute GI bleeding    Allergy    Anemia    Anterior chest wall pain    Appendicitis 1965   Asthma    Body mass index  37.0-37.9, adult    Breast pain    Cataract    both eyes   CHF (congestive heart failure) ([place])    Chronic kidney disease    stage 5 - on dialysis   Cognitive change 04/20/2021   r/t cva 03/2821   Dehydration 2014   Deviated septum 1971    Diabetes mellitus    no meds   Dyspnea 2014   Extrinsic asthma    WITH ASTHMA ATTACK   Fibroid 1980   GERD (gastroesophageal reflux disease)    Heart murmur    Hx gestational diabetes    Hyperlipidemia    Hypertension 2014   Inguinal hernia 1959   Malaise and fatigue 2014   Non-IgE mediated allergic asthma 2014   Obesity    Pelvic pain    Pregnancy, high-risk 1985   Stroke ([place]) 04/20/2021   (CVA) of right basal ganglia   Tonsillitis 1968   Uterine fibroid 1980    PSH:  Past Surgical History:  Procedure Laterality Date   ABDOMINAL AORTOGRAM W/LOWER EXTREMITY N/A 02/21/2022   Procedure: ABDOMINAL AORTOGRAM W/LOWER EXTREMITY;  Surgeon: Waynetta Sandy, MD;  Location: [facility];  Service: Cardiovascular;  Laterality: N/A;   APPENDECTOMY  9381   BASCILIC VEIN TRANSPOSITION Right 04/30/2021   Procedure: RIGHT FIRST STAGE [place];  Surgeon: Angelia Mould, MD;  Location: [facility];  Service: Vascular;  Laterality: Right;   CESAREAN SECTION  1985   COLONOSCOPY     ESOPHAGOGASTRODUODENOSCOPY (EGD) WITH PROPOFOL N/A 04/18/2021   Procedure: ESOPHAGOGASTRODUODENOSCOPY (EGD) WITH PROPOFOL;  Surgeon: Gatha Mayer, MD;  Location: [facility];  Service: Endoscopy;  Laterality: N/A;   EYE SURGERY     bilateral cataract    FLEXIBLE SIGMOIDOSCOPY N/A 04/18/2021   Procedure: FLEXIBLE SIGMOIDOSCOPY;  Surgeon: Gatha Mayer, MD;  Location: [facility];  Service: Endoscopy;  Laterality: N/A;   HERNIA REPAIR  1959   IR FLUORO GUIDE CV LINE RIGHT  03/18/2021   IR US GUIDE VASC ACCESS RIGHT  03/18/2021   LEFT HEART CATH AND CORONARY ANGIOGRAPHY N/A 04/04/2017   Procedure: Left Heart Cath and Coronary Angiography;  Surgeon: Belva Crome, MD;  Location: [facility];  Service: Cardiovascular;  Laterality: N/A;   [place], 2004, 2007   PERIPHERAL VASCULAR THROMBECTOMY  02/21/2022   Procedure: PERIPHERAL VASCULAR THROMBECTOMY;  Surgeon: Waynetta Sandy, MD;  Location: [facility];  Service: Cardiovascular;;  Right Tibial   RHINOPLASTY  1971   ROTATOR CUFF REPAIR  2003   SURGICAL REPAIR OF HEMORRHAGE  2015   TONSILLECTOMY  1968    Social History:  Social History   Socioeconomic History   Marital status: Married    Spouse name: Carloyn Russo   Number of children: 1   Years of education: Not on file   Highest education level: Professional school degree (e.g., MD, DDS, DVM, JD)  Occupational History   Occupation: retired  Tobacco Use   Smoking status: Never   Smokeless tobacco: Never  Vaping Use   Vaping Use: Never used  Substance and Sexual Activity   Alcohol use: No   Drug use: No   Sexual activity: Not Currently    Birth control/protection: Post-menopausal  Other Topics Concern   Not on file  Social History Narrative   06/21/21 lives with husband, caregiver- Carolyn Russo   Patient reports she used to walk at [employer] but now due to Covid-19 she is staying inside.  Social Determinants of Health   Financial Resource Strain: Low Risk  (09/02/2021)   Overall Financial Resource Strain (CARDIA)    Difficulty of Paying Living Expenses: Not hard at all  Food Insecurity: No Food Insecurity (09/02/2021)   Hunger Vital Sign    Worried About Running Out of Food in the Last Year: Never true    Ran Out of Food in the Last Year: Never true  Transportation Needs: No Transportation Needs (09/02/2021)   PRAPARE - Hydrologist (Medical): No    Lack of Transportation (Non-Medical): No  Physical Activity: Inactive (09/02/2021)   Exercise Vital Sign    Days of Exercise per Week: 0 days    Minutes of Exercise per Session: 0 min  Stress: Stress Concern Present (09/02/2021)   [place]    Feeling of Stress : To some extent  Social Connections: Not on file  Intimate Partner Violence: Not on file    Family History:  Family History   Problem Relation Age of Onset   Cancer Mother        abdominal melamona   Psoriasis Mother    Alzheimer's disease Father    Cancer Cousin        colon    Diabetes Cousin    Diabetes Maternal Aunt    Colon cancer Neg Hx    Colon polyps Neg Hx    Esophageal cancer Neg Hx    Rectal cancer Neg Hx    Stomach cancer Neg Hx    Breast cancer Neg Hx     Medications:   Current Outpatient Medications on File Prior to Visit  Medication Sig Dispense Refill   acetaminophen (TYLENOL) 500 MG tablet Take 1,000 mg by mouth 2 (two) times daily as needed (leg and arm pain).     albuterol (VENTOLIN HFA) 108 (90 Base) MCG/ACT inhaler Inhale 2 puffs into the lungs every 4 (four) hours as needed for wheezing or shortness of breath. 18 g 3   Camphor-Menthol-Methyl Sal (SALONPAS) 3.10-29-08 % PTCH Apply 1 application. topically daily as needed (Wrist pain).     carvedilol (COREG) 3.125 MG tablet Take 1 tablet (3.125 mg total) by mouth 2 (two) times daily with a meal. 180 tablet 1   clopidogrel (PLAVIX) 75 MG tablet TAKE 1 TABLET(75 MG) BY MOUTH DAILY 90 tablet 1   Continuous Blood Gluc Receiver (FREESTYLE LIBRE 2 READER) DEVI by Does not apply route.     diclofenac Sodium (VOLTAREN) 1 % GEL Apply 2 g topically 4 (four) times daily. To left wrist (Patient taking differently: Apply 2 g topically daily as needed (pain). To left wrist) 150 g 0   diphenhydrAMINE (BENADRYL) 2 % cream Apply 1 application. topically daily as needed for itching.     diphenhydrAMINE (BENADRYL) 25 MG tablet Take 25 mg by mouth at bedtime.     famotidine (PEPCID) 20 MG tablet Take 20 mg by mouth 2 (two) times daily as needed (Nausea).     fluticasone (FLONASE) 50 MCG/ACT nasal spray Place 2 sprays into the nose daily as needed for allergies.      hydrALAZINE (APRESOLINE) 10 MG tablet Take 1 tablet (10 mg total) by mouth every 8 (eight) hours as needed (Sys BP >180, hold on HD days). 90 tablet 0   hydrocortisone (ANUSOL-HC) 2.5 % rectal  cream Place 1 application rectally 2 (two) times daily as needed for hemorrhoids or anal itching. 30 g 1  hydrocortisone cream 1 % Apply to affected area 2 times daily 30 g 1   hydrOXYzine (ATARAX/VISTARIL) 25 MG tablet Take 1 tablet (25 mg total) by mouth at bedtime as needed. 30 tablet 2   insulin aspart (NOVOLOG) 100 UNIT/ML injection Inject 6-16 Units into the skin 3 (three) times daily before meals. Sliding scale 100-150=6 units 151-200=10 units 201-250=12 units 251-300=14 units 301-350=16 units Greater than 350 = 16 units   May take an additional 2 units at lunch of needed     insulin glargine (LANTUS) 100 UNIT/ML Solostar Pen Inject 13 Units into the skin daily. (Patient taking differently: Inject 12 Units into the skin at bedtime. Basaglar) 15 mL 2   Insulin Pen Needle (BD PEN NEEDLE NANO 2ND GEN) 32G X 4 MM MISC See admin instructions.     iron sucrose in sodium chloride 0.9 % 100 mL Inject 100 mg into the vein as needed (based on labs).     levETIRAcetam (KEPPRA) 500 MG tablet TAKE 1 TABLET(500 MG) BY MOUTH AT BEDTIME 90 tablet 1   losartan (COZAAR) 50 MG tablet Take 50 mg by mouth 2 (two) times daily.     melatonin 3 MG TABS tablet Take 1 tablet (3 mg total) by mouth at bedtime. (Patient taking differently: Take 5 mg by mouth at bedtime.) 30 tablet 0   Methoxy PEG-Epoetin Beta (MIRCERA IJ) Mircera     methylphenidate (RITALIN) 5 MG tablet Take 1 tablet (5 mg total) by mouth 2 (two) times daily with breakfast and lunch. Due to stroke attention 180 tablet 0   mometasone-formoterol (DULERA) 200-5 MCG/ACT AERO Inhale 2 puffs into the lungs 2 (two) times daily. (Patient taking differently: Inhale 2 puffs into the lungs 2 (two) times daily as needed for wheezing or shortness of breath.) 3 each 2   mupirocin ointment (BACTROBAN) 2 % Apply 1 application. topically 2 (two) times daily. (Patient taking differently: Apply 1 application. topically 2 (two) times daily. On her toe) 60 g 2    Nutritional Supplements (,FEEDING SUPPLEMENT, PROSOURCE PLUS) liquid Take 30 mLs by mouth 2 (two) times daily between meals. 887 mL 0   ondansetron (ZOFRAN) 4 MG tablet Take 1 tablet (4 mg total) by mouth every 6 (six) hours as needed for nausea or vomiting. 30 tablet 0   ondansetron (ZOFRAN-ODT) 4 MG disintegrating tablet Take 1 tablet (4 mg total) by mouth every 8 (eight) hours as needed for nausea or vomiting. 20 tablet 1   OneTouch Delica Lancets 61Y MISC Use as directed to check blood sugars before breakfast and dinner dx: e11.65 100 each 11   pantoprazole (PROTONIX) 40 MG tablet Take 1 tablet (40 mg total) by mouth daily. 90 tablet 2   phenylephrine (,USE FOR PREPARATION-H,) 0.25 % suppository Place 1 suppository rectally 2 (two) times daily. (Patient taking differently: Place 1 suppository rectally 2 (two) times daily as needed for hemorrhoids.) 12 suppository 0   polyethylene glycol powder (GLYCOLAX/MIRALAX) 17 GM/SCOOP powder Take 17 g by mouth daily. 510 g 0   rosuvastatin (CRESTOR) 20 MG tablet Take 1 tablet (20 mg total) by mouth daily. 90 tablet 2   senna-docusate (SENOKOT-S) 8.6-50 MG tablet Take 2 tablets by mouth 2 (two) times daily. (Patient taking differently: Take 2 tablets by mouth daily as needed for mild constipation or moderate constipation.) 360 tablet 2   sevelamer carbonate (RENVELA) 0.8 g PACK packet Take 0.8 g by mouth 3 (three) times daily with meals.     traMADol Veatrice Bourbon)  50 MG tablet TAKE 1 TABLET(50 MG) BY MOUTH EVERY 12 HOURS FOR 5 DAYS 10 tablet 0   [DISCONTINUED] iron polysaccharides (NIFEREX) 150 MG capsule Take 1 capsule (150 mg total) by mouth daily. (Patient not taking: No sig reported) 30 capsule 12   No current facility-administered medications on file prior to visit.    Allergies:   Allergies  Allergen Reactions   Food Anaphylaxis    Peanuts; Almonds   Wheat Bran Shortness Of Breath    Other reaction(s): shortness of breath, itching   Statins Itching  and Other (See Comments)    Generalized aches Other reaction(s): aches   Iodine Other (See Comments)    The patient denied this allergy to me on 03/16/2021 Other reaction(s): Unknown   Shellfish Allergy Other (See Comments)    Mouth gets raw   Sitagliptin Other (See Comments)    Januiva   Tetracycline Other (See Comments)    Raw mouth   Contrast Media [Iodinated Contrast Media] Rash    Happened during CT scan over 30 years ago      OBJECTIVE:  Physical Exam  Vitals:   03/31/22 1509  BP: (!) 187/95  Height: 5' 2.5" (1.588 m)    Body mass index is 37.26 kg/m. No results found.  General: well developed, well nourished, pleasant elderly African-American female, seated, in no evident distress Head: head normocephalic and atraumatic.   Neck: supple with no carotid or supraclavicular bruits Cardiovascular: regular rate and rhythm, no murmurs Musculoskeletal: right hand weakness with increased tone s/p fistula placement Skin:  no rash/petichiae Vascular:  Normal pulses all extremities   Neurologic Exam Mental Status: Awake and fully alert. Fluent speech and language. Oriented to place and time. Recent and remote memory intact. Attention span, concentration and fund of knowledge appropriate. Mood and affect appropriate and cooperative with exam.  Cranial Nerves: Pupils equal, briskly reactive to light. Extraocular movements full without nystagmus. Visual fields full to confrontation testing. Hearing intact. Facial sensation intact.  Left nasolabial fold flattening.  Tongue, palate moves normally and symmetrically.  Motor: LUE: 4-/5 deltoid, 4+/5 elbow flexion and extension, 4/5 grip strength. LLE: 4+/5 hip flexor weakness; RUE: 3+/5 grip strength s/p fistula placement; RLE: 5/5 Sensory.: intact to touch , pinprick , position and vibratory sensation.  Coordination: Rapid alternating movements decreased in upper extremities bilaterally. Finger-to-nose difficulty performing upper  extremities and heel-to-shin performed accurately bilaterally. Gait and Station: Deferred as RW not present Reflexes: 1+ and symmetric. Toes downgoing.         ASSESSMENT: LILLYBETH TAL is a 71 y.o. year old female with R BG/CR infarct secondary to small vessel disease on 04/16/2021 after presenting with 3 days of left sided weakness. Possible seizure on 7/13  for AMS after HD session.  Vascular risk factors include HTN, HLD, DM, ESRD on HD.      PLAN:  R BG/CR infarct :  Residual deficit: Left hemiparesis with some improvement noted since prior visit.  Discussed use of rolling walker at all times unless otherwise instructed. continue clopidogrel 75 mg daily  and Crestor for secondary stroke prevention. Managed by PCP  Discussed secondary stroke prevention measures and importance of close PCP follow up for aggressive stroke risk factor management including HTN with BP goal<130/90, HLD with LDL goal<70 and DM with A1c goal<7.  I have gone over the pathophysiology of stroke, warning signs and symptoms, risk factors and their management in some detail with instructions to go to the closest emergency room for  symptoms of concern. Post stroke seizures: Continue Keppra 500 mg nightly (renal dose) for seizure prophylaxis.  R arm weakness s/p fistula placement: ?  Subclavian steal syndrome.  Monitored by vascular surgery and PMR    Follow up in 6 months or call earlier if needed   CC:  PCP: [name], MD    I spent 31 minutes of face-to-face and non-face-to-face time with patient and caregiver.  This included previsit chart review, lab review, study review, electronic health record documentation, patient and caregiver education regarding prior stroke with residual deficits, secondary stroke prevention measures and importance of managing stroke risk factors, right arm weakness s/p fistula placement and answered all questions to patient and caregivers satisfaction  Frann Rider,  AGNP-BC  [facility] [address] [place], [place] [address]  Phone [phone] Fax [phone] Note: This document was prepared with digital dictation and possible smart phrase technology. Any transcriptional errors that result from this process are unintentional.

## 2022-04-01 DIAGNOSIS — Z992 Dependence on renal dialysis: Secondary | ICD-10-CM | POA: Diagnosis not present

## 2022-04-01 DIAGNOSIS — N186 End stage renal disease: Secondary | ICD-10-CM | POA: Diagnosis not present

## 2022-04-01 DIAGNOSIS — N2581 Secondary hyperparathyroidism of renal origin: Secondary | ICD-10-CM | POA: Diagnosis not present

## 2022-04-02 DIAGNOSIS — E1165 Type 2 diabetes mellitus with hyperglycemia: Secondary | ICD-10-CM | POA: Diagnosis not present

## 2022-04-04 ENCOUNTER — Other Ambulatory Visit: Payer: Self-pay | Admitting: *Deleted

## 2022-04-04 ENCOUNTER — Other Ambulatory Visit: Payer: Self-pay | Admitting: Podiatry

## 2022-04-04 ENCOUNTER — Encounter: Payer: Self-pay | Admitting: Podiatry

## 2022-04-04 DIAGNOSIS — N186 End stage renal disease: Secondary | ICD-10-CM | POA: Diagnosis not present

## 2022-04-04 DIAGNOSIS — Z992 Dependence on renal dialysis: Secondary | ICD-10-CM | POA: Diagnosis not present

## 2022-04-04 DIAGNOSIS — I739 Peripheral vascular disease, unspecified: Secondary | ICD-10-CM

## 2022-04-04 DIAGNOSIS — N2581 Secondary hyperparathyroidism of renal origin: Secondary | ICD-10-CM | POA: Diagnosis not present

## 2022-04-04 MED ORDER — TRAMADOL HCL 50 MG PO TABS: 50.0000 mg | ORAL_TABLET | Freq: Four times a day (QID) | ORAL | 0 refills | Status: AC | PRN

## 2022-04-05 ENCOUNTER — Telehealth: Payer: Self-pay

## 2022-04-05 NOTE — Chronic Care Management (AMB) (Signed)
Chronic Care Management Pharmacy Assistant   Name: Carolyn Russo  MRN: [id number] DOB: 1951-03-16  Reason for Encounter: Disease State/ Hypertension  Recent office visits:  None  Recent consult visits:  03-31-2022 Frann Rider, NP (Neurology). Follow up visit. No changes.  03-30-2022 Waynetta Sandy, MD (Vascular surgery). Follow up visit. Recommended toe amputation.   03-29-2022 Criselda Peaches, DPM (Podiatry). Follow up on right foot.  03-29-2022 Waynetta Sandy, MD (Cardiology). HC [place] lower extrem artery.  03-04-2022 Courtney Heys, MD (Physical medicine/rehab). Follow up visit. No changes.  03-03-2022 Criselda Peaches, DPM (Podiatry). START tramadol 50 mg every 12 hours.  02-21-2022 Waynetta Sandy, MD (Cardiology). ABDOMINAL AORTOGRAM W/LOWER EXTREMITY procedure completed.  02-17-2022 Guido Sander, PT (Physical therapy). Physical therapy for muscle weakness.  02-15-2022 Guido Sander, PT (Physical therapy). Physical therapy for muscle weakness.  02-10-2022 Hulda Marin, OT. Occupational therapy.  02-09-2022 Waynetta Sandy, MD (Vascular surgery). Follow up visit.  02-08-2022  Guido Sander, PT (Physical therapy). Physical therapy for muscle weakness.  02-08-2022 Hulda Marin, OT. Occupational therapy.  02-03-2022 Hulda Marin, OT. Occupational therapy.  02-01-2022 Criselda Peaches, DPM (Podiatry). Follow up visit for ulcer on right great toe. Referral placed to vascular surgery.  01-27-2022 Hulda Marin, OT. Occupational therapy.  01-27-2022 Drema Balzarine, PTA. Physical therapy for muscle weakness.  01-25-2022 Belva Crome, MD (Cardiology). Follow up visit. No changes.  01-25-2022 Hulda Marin, OT. Occupational therapy.  01-25-2022  Guido Sander, PT (Physical therapy). Physical therapy for muscle weakness.  01-19-2022 Zachery Conch, OT. Occupational therapy.  01-19-2022 Drema Balzarine, PTA. Physical therapy for muscle weakness.  01-18-2022 Hulda Marin, OT. Occupational therapy.  01-18-2022 Guido Sander, PT (Physical therapy). Physical therapy for muscle weakness.  01-17-2022 Criselda Peaches, DPM (Podiatry). START bactroban 2% apply twice daily.  Hospital visits:  Medication Reconciliation was completed by comparing discharge summary, patient's EMR and Pharmacy list, and upon discussion with patient.  Admitted to the hospital on 03-07-2022 due to nausea. Discharge date was 03-07-2022. Discharged from [place].    New?Medications Started at [facility] Discharge:?? None  Medication Changes at Hospital Discharge: None  Medications Discontinued at Hospital Discharge: None  Medications that remain the same after Hospital Discharge:??  -All other medications will remain the same.    Medications: Outpatient Encounter Medications as of 04/05/2022  Medication Sig Note   acetaminophen (TYLENOL) 500 MG tablet Take 1,000 mg by mouth 2 (two) times daily as needed (leg and arm pain).    albuterol (VENTOLIN HFA) 108 (90 Base) MCG/ACT inhaler Inhale 2 puffs into the lungs every 4 (four) hours as needed for wheezing or shortness of breath.    Camphor-Menthol-Methyl Sal (SALONPAS) 3.10-29-08 % PTCH Apply 1 application. topically daily as needed (Wrist pain).    carvedilol (COREG) 3.125 MG tablet Take 1 tablet (3.125 mg total) by mouth 2 (two) times daily with a meal.    clopidogrel (PLAVIX) 75 MG tablet TAKE 1 TABLET(75 MG) BY MOUTH DAILY    Continuous Blood Gluc Receiver (FREESTYLE LIBRE 2 READER) DEVI by Does not apply route.    diclofenac Sodium (VOLTAREN) 1 % GEL Apply 2 g topically 4 (four) times daily. To left wrist (Patient taking differently: Apply 2 g topically daily as needed (pain). To left wrist)    diphenhydrAMINE (BENADRYL) 2 % cream Apply 1 application. topically daily as needed for itching.    diphenhydrAMINE (BENADRYL) 25 MG tablet Take  25  mg by mouth at bedtime.    famotidine (PEPCID) 20 MG tablet Take 20 mg by mouth 2 (two) times daily as needed (Nausea).    fluticasone (FLONASE) 50 MCG/ACT nasal spray Place 2 sprays into the nose daily as needed for allergies.     hydrALAZINE (APRESOLINE) 10 MG tablet Take 1 tablet (10 mg total) by mouth every 8 (eight) hours as needed (Sys BP >180, hold on HD days).    hydrocortisone (ANUSOL-HC) 2.5 % rectal cream Place 1 application rectally 2 (two) times daily as needed for hemorrhoids or anal itching.    hydrocortisone cream 1 % Apply to affected area 2 times daily    hydrOXYzine (ATARAX/VISTARIL) 25 MG tablet Take 1 tablet (25 mg total) by mouth at bedtime as needed.    insulin aspart (NOVOLOG) 100 UNIT/ML injection Inject 6-16 Units into the skin 3 (three) times daily before meals. Sliding scale 100-150=6 units 151-200=10 units 201-250=12 units 251-300=14 units 301-350=16 units Greater than 350 = 16 units   May take an additional 2 units at lunch of needed    insulin glargine (LANTUS) 100 UNIT/ML Solostar Pen Inject 13 Units into the skin daily. (Patient taking differently: Inject 12 Units into the skin at bedtime. Basaglar)    Insulin Pen Needle (BD PEN NEEDLE NANO 2ND GEN) 32G X 4 MM MISC See admin instructions.    iron sucrose in sodium chloride 0.9 % 100 mL Inject 100 mg into the vein as needed (based on labs). 02/18/2022: At dialysis   levETIRAcetam (KEPPRA) 500 MG tablet TAKE 1 TABLET(500 MG) BY MOUTH AT BEDTIME    losartan (COZAAR) 50 MG tablet Take 50 mg by mouth 2 (two) times daily.    melatonin 3 MG TABS tablet Take 1 tablet (3 mg total) by mouth at bedtime. (Patient taking differently: Take 5 mg by mouth at bedtime.)    Methoxy PEG-Epoetin Beta (MIRCERA IJ) Mircera 02/18/2022: Dialysis   methylphenidate (RITALIN) 5 MG tablet Take 1 tablet (5 mg total) by mouth 2 (two) times daily with breakfast and lunch. Due to stroke attention    mometasone-formoterol (DULERA) 200-5 MCG/ACT  AERO Inhale 2 puffs into the lungs 2 (two) times daily. (Patient taking differently: Inhale 2 puffs into the lungs 2 (two) times daily as needed for wheezing or shortness of breath.)    mupirocin ointment (BACTROBAN) 2 % Apply 1 application. topically 2 (two) times daily. (Patient taking differently: Apply 1 application. topically 2 (two) times daily. On her toe)    Nutritional Supplements (,FEEDING SUPPLEMENT, PROSOURCE PLUS) liquid Take 30 mLs by mouth 2 (two) times daily between meals.    ondansetron (ZOFRAN) 4 MG tablet Take 1 tablet (4 mg total) by mouth every 6 (six) hours as needed for nausea or vomiting.    ondansetron (ZOFRAN-ODT) 4 MG disintegrating tablet Take 1 tablet (4 mg total) by mouth every 8 (eight) hours as needed for nausea or vomiting.    OneTouch Delica Lancets 56E MISC Use as directed to check blood sugars before breakfast and dinner dx: e11.65    pantoprazole (PROTONIX) 40 MG tablet Take 1 tablet (40 mg total) by mouth daily.    phenylephrine (,USE FOR PREPARATION-H,) 0.25 % suppository Place 1 suppository rectally 2 (two) times daily. (Patient taking differently: Place 1 suppository rectally 2 (two) times daily as needed for hemorrhoids.)    polyethylene glycol powder (GLYCOLAX/MIRALAX) 17 GM/SCOOP powder Take 17 g by mouth daily.    rosuvastatin (CRESTOR) 20 MG tablet Take 1  tablet (20 mg total) by mouth daily. 02/18/2022: Ran out   senna-docusate (SENOKOT-S) 8.6-50 MG tablet Take 2 tablets by mouth 2 (two) times daily. (Patient taking differently: Take 2 tablets by mouth daily as needed for mild constipation or moderate constipation.)    sevelamer carbonate (RENVELA) 0.8 g PACK packet Take 0.8 g by mouth 3 (three) times daily with meals.    traMADol (ULTRAM) 50 MG tablet TAKE 1 TABLET(50 MG) BY MOUTH EVERY 12 HOURS FOR 5 DAYS    traMADol (ULTRAM) 50 MG tablet Take 1 tablet (50 mg total) by mouth every 6 (six) hours as needed for up to 5 days.    [DISCONTINUED] iron  polysaccharides (NIFEREX) 150 MG capsule Take 1 capsule (150 mg total) by mouth daily. (Patient not taking: No sig reported)    No facility-administered encounter medications on file as of 04/05/2022.  Reviewed chart prior to disease state call. Spoke with patient regarding BP  Recent Office Vitals: BP Readings from Last 3 Encounters:  03/31/22 (!) 187/95  03/30/22 (!) 174/84  03/07/22 (!) 184/88   Pulse Readings from Last 3 Encounters:  03/30/22 75  03/07/22 77  03/04/22 81    Wt Readings from Last 3 Encounters:  03/30/22 207 lb (93.9 kg)  03/07/22 207 lb 3.7 oz (94 kg)  03/04/22 203 lb (92.1 kg)     Kidney Function Lab Results  Component Value Date/Time   CREATININE 8.05 (H) 03/07/2022 08:01 AM   CREATININE 8.30 (H) 02/21/2022 10:02 AM   CREATININE 0.84 10/13/2011 09:03 AM   GFRNONAA 5 (L) 03/07/2022 08:01 AM   GFRNONAA 75 10/13/2011 09:03 AM   GFRAA 17 06/19/2020 12:00 AM   GFRAA 86 10/13/2011 09:03 AM       Latest Ref Rng & Units 03/07/2022    8:01 AM 02/21/2022   10:02 AM 05/26/2021   12:41 PM  BMP  Glucose 70 - 99 mg/dL 158  122  186   BUN 8 - 23 mg/dL 37  41  22   Creatinine 0.44 - 1.00 mg/dL 8.05  8.30  5.29   Sodium 135 - 145 mmol/L 140  141  129   Potassium 3.5 - 5.1 mmol/L 4.6  4.5  4.8   Chloride 98 - 111 mmol/L 98  103  96   CO2 22 - 32 mmol/L 29   24   Calcium 8.9 - 10.3 mg/dL 9.1   9.3     Current antihypertensive regimen:  Hydralazine 10 mg every 8 hours as needed  How often are you checking your Blood Pressure? daily  Current home BP readings: Patient states she couldn't reach blood pressure readings but they have been elevated.  What recent interventions/DTPs have been made by any provider to improve Blood Pressure control since last CPP Visit: None  Any recent hospitalizations or ED visits since last visit with CPP? Yes  What diet changes have been made to improve Blood Pressure Control?  Patient states she limits her salt intake and drinks  allowed amount of water.  What exercise is being done to improve your Blood Pressure Control?  Patient stated she does stretching and walking exercises recommended from physical therapy.   Adherence Review: Is the patient currently on ACE/ARB medication? No Does the patient have >5 day gap between last estimated fill dates? No   Care Gaps: PNA vac overdue Shingrix overdue Dexa scan overdue Tdap overdue last completed 07-23-2008 3rd covid vaccine overdue last completed 01-07-2020 Yearly Ophthalmology overdue last completed 11-18-2019  Colonoscopy overdue last completed 04-10-2018 Flu vaccine overdue last completed 08-12-2020 AWV 09-22-2022  Star Rating Drugs: Rosuvastatin 20 mg- Last filled 02-23-2022 90 DS [facility]  [place] Clinical Pharmacist Assistant [phone]

## 2022-04-06 DIAGNOSIS — N186 End stage renal disease: Secondary | ICD-10-CM | POA: Diagnosis not present

## 2022-04-06 DIAGNOSIS — Z992 Dependence on renal dialysis: Secondary | ICD-10-CM | POA: Diagnosis not present

## 2022-04-06 DIAGNOSIS — N2581 Secondary hyperparathyroidism of renal origin: Secondary | ICD-10-CM | POA: Diagnosis not present

## 2022-04-08 DIAGNOSIS — Z992 Dependence on renal dialysis: Secondary | ICD-10-CM | POA: Diagnosis not present

## 2022-04-08 DIAGNOSIS — N2581 Secondary hyperparathyroidism of renal origin: Secondary | ICD-10-CM | POA: Diagnosis not present

## 2022-04-08 DIAGNOSIS — N186 End stage renal disease: Secondary | ICD-10-CM | POA: Diagnosis not present

## 2022-04-11 ENCOUNTER — Ambulatory Visit (INDEPENDENT_AMBULATORY_CARE_PROVIDER_SITE_OTHER): Payer: Medicare PPO | Admitting: Podiatry

## 2022-04-11 DIAGNOSIS — I998 Other disorder of circulatory system: Secondary | ICD-10-CM | POA: Diagnosis not present

## 2022-04-11 DIAGNOSIS — N2581 Secondary hyperparathyroidism of renal origin: Secondary | ICD-10-CM | POA: Diagnosis not present

## 2022-04-11 DIAGNOSIS — N186 End stage renal disease: Secondary | ICD-10-CM | POA: Diagnosis not present

## 2022-04-11 DIAGNOSIS — I96 Gangrene, not elsewhere classified: Secondary | ICD-10-CM | POA: Diagnosis not present

## 2022-04-11 DIAGNOSIS — M79671 Pain in right foot: Secondary | ICD-10-CM

## 2022-04-11 DIAGNOSIS — Z992 Dependence on renal dialysis: Secondary | ICD-10-CM | POA: Diagnosis not present

## 2022-04-12 ENCOUNTER — Encounter (HOSPITAL_COMMUNITY): Payer: Self-pay | Admitting: Podiatry

## 2022-04-12 ENCOUNTER — Telehealth: Payer: Self-pay

## 2022-04-12 ENCOUNTER — Telehealth: Payer: Self-pay | Admitting: Podiatry

## 2022-04-12 ENCOUNTER — Other Ambulatory Visit: Payer: Self-pay

## 2022-04-12 DIAGNOSIS — I96 Gangrene, not elsewhere classified: Secondary | ICD-10-CM

## 2022-04-12 DIAGNOSIS — E1151 Type 2 diabetes mellitus with diabetic peripheral angiopathy without gangrene: Secondary | ICD-10-CM

## 2022-04-12 DIAGNOSIS — I739 Peripheral vascular disease, unspecified: Secondary | ICD-10-CM

## 2022-04-12 MED ORDER — OXYCODONE HCL 5 MG PO TABS: 5.0000 mg | ORAL_TABLET | Freq: Four times a day (QID) | ORAL | 0 refills | Status: DC | PRN

## 2022-04-12 NOTE — Telephone Encounter (Signed)
I spoke with her husband by telephone and we discussed the plan for surgery again on Friday, discussed that it will be at [facility].  He was interested in hyperbarics postoperatively to see if this will help for her wound healing ability, discussed with him I am not sure if she would qualify for this but I am happy to send a referral and a wound care hyperbaric center order referral was placed.  Oxycodone Rx sent to pharmacy as we discussed at her appointment yesterday as tramadol has not been effective.  We also discussed the day of surgery she should probably go ahead and go to her dialysis session it is okay for her to have toast around 6 AM like she usually does, n.p.o. past 8 AM as her surgery is at 4 PM.

## 2022-04-12 NOTE — Telephone Encounter (Signed)
DOS 04/15/2022  AMPUTATION 1ST RT - 28810  The following codes do not require a pre-authorization Created on 04/12/2022  [place] 10/24/2021 - 10/23/9998  Service info [phone] Amputation, metatarsal, with toe, single

## 2022-04-12 NOTE — Telephone Encounter (Signed)
Patient husband called and would like Dr. Sherryle Lis to give him a call at his earliest convenience

## 2022-04-12 NOTE — Progress Notes (Signed)
Subjective:  Patient ID: Carolyn Russo, female    DOB: 10-18-1951,  MRN: [id number]  Chief Complaint  Patient presents with   Foot Ulcer    71 y.o. female presents with the above complaint. History confirmed with patient.  She presents swelling of her right great toe there is a small blister they noticed after traveling and wearing a different type of shoe  Interval history: Still very painful.  Using Betadine ointment as directed.  She is take the tramadol.  Objective:  Physical Exam: Foot is warm and the ankles to cool at the midfoot, weakly palpable PT pulse, dry gangrene appears to have fully demarcated now.  No signs of purulence or acute infection    ABI and ultrasound completed this morning 03/29/2022    ABI Findings:  +---------+------------------+-----+----------+----------------------------  ----+  Right    Rt Pressure (mmHg)IndexWaveform  Comment                            +---------+------------------+-----+----------+----------------------------  ----+  PTA      254               1.31 monophasicsounded better than could  record  +---------+------------------+-----+----------+----------------------------  ----+  PERO     254               1.31 monophasic                                    +---------+------------------+-----+----------+----------------------------  ----+  DP       254               1.31 monophasic                                    +---------+------------------+-----+----------+----------------------------  ----+  Great Toe0                 0.00 Absent                                        +---------+------------------+-----+----------+----------------------------  ----+   +---------+------------------+-----+-------------------+-------+  Left     Lt Pressure (mmHg)IndexWaveform           Comment  +---------+------------------+-----+-------------------+-------+  Brachial 194                                                 +---------+------------------+-----+-------------------+-------+  PTA      254               1.31 dampened monophasic         +---------+------------------+-----+-------------------+-------+  PERO     254                    triphasic                   +---------+------------------+-----+-------------------+-------+  DP       173               0.89 triphasic                   +---------+------------------+-----+-------------------+-------+  Great Toe70                0.36 Abnormal                    +---------+------------------+-----+-------------------+-------+   +-------+-----------+-----------+-----------------+------------+  ABI/TBIToday's ABIToday's TBIPrevious ABI     Previous TBI  +-------+-----------+-----------+-----------------+------------+  Right  [name]         0          0.99 (unreliable)0.19          +-------+-----------+-----------+-----------------+------------+  Left   [name]         0.36       unobtainable     0.53          +-------+-----------+-----------+-----------------+------------+  Assessment:   1. Ischemic pain of foot, right   2. Dry gangrene ([name])        Plan:  Patient was evaluated and treated and all questions answered.  We again discussed her prognosis and that the toe is unsalvageable at this point.  I previously discussed her case with Dr. [name], he feels that she is maximally optimized from a vascular standpoint currently.  We discussed hallux versus partial ray amputation and the rationale for a more proximal amputation for wound closure.  We also discussed that there is a significant chance that is not successful and she requires transmetatarsal amputation due to the severity of her vascular disease.  They both understand the risks and her amenable to proceeding.  Surgery will be scheduled for this week.  I would like to have her hemoglobin checked prior to surgery, she has recently been anemic  and has not had transfusions yet but does get Epogen supplementation at dialysis.  We will plan for postoperative admission for observation after surgery as well, she likely would benefit from physical therapy the next morning additionally.  We discussed the risk benefits and potential complications of surgery.  Informed consent was signed and reviewed.   Surgical plan:  Procedure: -Hallux versus partial first ray amputation of the right foot  Location: -[facility], plan for observation admission after surgery  Anesthesia plan: -MAC with local   WB Restrictions / DME needs: -WBAT in surgical shoe postoperatively   No follow-ups on file.

## 2022-04-12 NOTE — Progress Notes (Signed)
For Short Stay: [place] appointment date:N/A Date of COVID positive in last 3 days:N/A  Bowel Prep reminder: N/A   For Anesthesia: PCP - [name], MD Cardiologist - Belva Crome, MD last office visit note 01/25/22 in epic Nephrologist- Dr. Danie Chandler Endocrinologist-Dr [place]  Chest x-ray - 03/07/22 in epic EKG - 03/09/22 in epic Stress Test - 03/21/2017 in epic ECHO - 03/11/21 in epic Cardiac Cath - 04/04/2017 in epic Pacemaker/ICD device last checked: N/A Pacemaker orders received: N/A Device Rep notified: N/A  Spinal Cord Stimulator:N/A  Sleep Study - No 10/07/2020 in epic CPAP - N/A  Fasting Blood Sugar - 60's-90's Freestyle Libre 2 Upper right arm Date and result of last Hgb A1c-  Blood Thinner Instructions: Plavix per Dr. Sherryle Lis ok to continue taking Aspirin Instructions: Last Dose:  Activity level:  Unable to go up a flight of stairs without chest pain and/or shortness of breath     Anesthesia review:  Congestive Heart Failure, moderately elevated pulmonary artery systolic  Pressure on ECHO, Chronic kidney disease stage 5 - on dialysis (M-W-F), Diabetes mellitus II, Hypertension, Stroke right basal ganglia,     Patient denies shortness of breath, fever, cough and chest pain at PAT appointment   Patient verbalized understanding of instructions that were given to them at the PAT appointment. Patient was also instructed that they will need to review over the PAT instructions again at home before surgery.

## 2022-04-12 NOTE — Telephone Encounter (Signed)
Pt's husband, Dr [name], called asking to speak to Dr [name] related to his wife's upcoming surgery for Fri, 6/23.  Returned call, two identifiers used. He wanted to see what Dr [name] thought of using a hyperbaric chamber to improve the chance of healing from the toe amputation. Informed him that Dr [name] was not in the office this week, but according to his last office note, the ankle had a great signal and the foot appeared well perfused. Encouraged him to reach out to the podiatrist to ask their opinion. Confirmed understanding.

## 2022-04-13 ENCOUNTER — Ambulatory Visit (INDEPENDENT_AMBULATORY_CARE_PROVIDER_SITE_OTHER): Payer: Medicare PPO | Admitting: Nurse Practitioner

## 2022-04-13 ENCOUNTER — Encounter: Payer: Self-pay | Admitting: Nurse Practitioner

## 2022-04-13 VITALS — BP 116/60 | HR 74 | Temp 98.4°F

## 2022-04-13 DIAGNOSIS — N2581 Secondary hyperparathyroidism of renal origin: Secondary | ICD-10-CM | POA: Diagnosis not present

## 2022-04-13 DIAGNOSIS — N186 End stage renal disease: Secondary | ICD-10-CM | POA: Diagnosis not present

## 2022-04-13 DIAGNOSIS — I6930 Unspecified sequelae of cerebral infarction: Secondary | ICD-10-CM

## 2022-04-13 DIAGNOSIS — Z01818 Encounter for other preprocedural examination: Secondary | ICD-10-CM

## 2022-04-13 DIAGNOSIS — M868X7 Other osteomyelitis, ankle and foot: Secondary | ICD-10-CM | POA: Diagnosis not present

## 2022-04-13 DIAGNOSIS — Z992 Dependence on renal dialysis: Secondary | ICD-10-CM | POA: Diagnosis not present

## 2022-04-13 DIAGNOSIS — E1122 Type 2 diabetes mellitus with diabetic chronic kidney disease: Secondary | ICD-10-CM | POA: Diagnosis not present

## 2022-04-13 DIAGNOSIS — E7800 Pure hypercholesterolemia, unspecified: Secondary | ICD-10-CM | POA: Diagnosis not present

## 2022-04-13 DIAGNOSIS — E1165 Type 2 diabetes mellitus with hyperglycemia: Secondary | ICD-10-CM | POA: Diagnosis not present

## 2022-04-13 DIAGNOSIS — I6381 Other cerebral infarction due to occlusion or stenosis of small artery: Secondary | ICD-10-CM | POA: Diagnosis not present

## 2022-04-13 MED ORDER — ALBUTEROL SULFATE HFA 108 (90 BASE) MCG/ACT IN AERS
2.0000 | INHALATION_SPRAY | RESPIRATORY_TRACT | 3 refills | Status: DC | PRN
Start: 2022-04-13 — End: 2022-06-24

## 2022-04-13 MED ORDER — MOMETASONE FURO-FORMOTEROL FUM 200-5 MCG/ACT IN AERO: 2.0000 | INHALATION_SPRAY | Freq: Two times a day (BID) | RESPIRATORY_TRACT | 2 refills | Status: DC

## 2022-04-13 NOTE — Progress Notes (Signed)
I,Tianna Badgett,acting as a Education administrator for [employer], FNP.,have documented all relevant documentation on the behalf of Minette Brine, FNP,as directed by  Minette Brine, FNP while in the presence of Minette Brine, [place].  This visit occurred during the SARS-CoV-2 public health emergency.  Safety protocols were in place, including screening questions prior to the visit, additional usage of staff PPE, and extensive cleaning of exam room while observing appropriate contact time as indicated for disinfecting solutions.  Subjective:     Patient ID: Carolyn Russo , female    DOB: 08-28-1951 , 71 y.o.   MRN: [id number]   Chief Complaint  Patient presents with   Pre-op Exam    HPI  Patients presents today for preop evaluation. She is scheduled to have her right great toe amputated on Friday, she reports Dr. Sherryle Lis said she has gangrene and she is having pain.      Past Medical History:  Diagnosis Date   Acute GI bleeding    Allergy    Anemia    Anterior chest wall pain    Appendicitis 1965   Asthma    Body mass index 37.0-37.9, adult    Breast pain    Cataract    both eyes   CHF (congestive heart failure) ([place])    Chronic kidney disease    stage 5 - on dialysis   Cognitive change 04/20/2021   r/t cva 10/2809   Complication of anesthesia    Memory loss after general   Dehydration 2014   Deviated septum 1971   Diabetes mellitus    Dysphagia due to old stroke    easy to get strangled when eating   Dyspnea 2014   Extrinsic asthma    WITH ASTHMA ATTACK   Fibroid 1980   GERD (gastroesophageal reflux disease)    Heart murmur    History of migraine    History of seizure    with stroke   Hx gestational diabetes    Hyperlipidemia    Hypertension 2014   Inguinal hernia 1959   Malaise and fatigue 2014   Non-IgE mediated allergic asthma 2014   Obesity    Pelvic pain    Pregnancy, high-risk 1985   Stroke ([place]) 04/20/2021   (CVA) of right basal ganglia   Tonsillitis 1968    Uterine fibroid 1980   Visual field defect    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     Family History  Problem Relation Age of Onset   Cancer Mother        abdominal melamona   Psoriasis Mother    Alzheimer's disease Father    Cancer Cousin        colon    Diabetes Cousin    Diabetes Maternal Aunt    Colon cancer Neg Hx    Colon polyps Neg Hx    Esophageal cancer Neg Hx    Rectal cancer Neg Hx    Stomach cancer Neg Hx    Breast cancer Neg Hx      Current Outpatient Medications:    acetaminophen (TYLENOL) 500 MG tablet, Take 1,000 mg by mouth 2 (two) times daily as needed (leg and arm pain)., Disp: , Rfl:    albuterol (VENTOLIN HFA) 108 (90 Base) MCG/ACT inhaler, Inhale 2 puffs into the lungs every 4 (four) hours as needed for wheezing or shortness of breath., Disp: 18 g, Rfl: 3   Alcaftadine (LASTACAFT) 0.25 % SOLN, Place 1 drop into both eyes daily as needed (itching)., Disp: , Rfl:  ARTIFICIAL TEAR SOLUTION OP, Place 1 drop into both eyes daily as needed (dry eyes)., Disp: , Rfl:    [place]way Oil-Levomenthol (FDGARD) 25-20.75 MG CAPS, Take 1 tablet by mouth 2 (two) times daily as needed (nausea / upset stomach)., Disp: , Rfl:    carvedilol (COREG) 3.125 MG tablet, Take 1 tablet (3.125 mg total) by mouth 2 (two) times daily with a meal., Disp: 180 tablet, Rfl: 1   cefadroxil (DURICEF) 500 MG capsule, Take 2 capsules (1,000 mg total) by mouth at bedtime for 5 days., Disp: 10 capsule, Rfl: 0   clopidogrel (PLAVIX) 75 MG tablet, TAKE 1 TABLET(75 MG) BY MOUTH DAILY, Disp: 90 tablet, Rfl: 1   Continuous Blood Gluc Receiver (FREESTYLE LIBRE 2 READER) DEVI, by Does not apply route., Disp: , Rfl:    diclofenac Sodium (VOLTAREN) 1 % GEL, Apply 2 g topically 4 (four) times daily. To left wrist (Patient taking differently: Apply 2 g topically daily as needed (pain). To left wrist), Disp: 150 g, Rfl: 0   diphenhydrAMINE (BENADRYL) 2 % cream, Apply 1 application. topically daily as needed for itching., Disp:  , Rfl:    diphenhydrAMINE (BENADRYL) 25 MG tablet, Take 25 mg by mouth at bedtime., Disp: , Rfl:    fluticasone (FLONASE) 50 MCG/ACT nasal spray, Place 2 sprays into the nose daily as needed for allergies. , Disp: , Rfl:    hydrALAZINE (APRESOLINE) 10 MG tablet, Take 1 tablet (10 mg total) by mouth every 8 (eight) hours as needed (Sys BP >180, hold on HD days)., Disp: 90 tablet, Rfl: 0   hydrocortisone (ANUSOL-HC) 2.5 % rectal cream, Place 1 application rectally 2 (two) times daily as needed for hemorrhoids or anal itching., Disp: 30 g, Rfl: 1   Insulin Aspart, w/Niacinamide, (FIASP) 100 UNIT/ML SOLN, Inject 6-16 Units as directed 3 (three) times daily as needed (high blood sugar). Sliding scale 100-150=6 units 151-200=10 units 201-250=12 units 251-300=14 units 301-350=16 units Greater than 350 = 16 units, Disp: , Rfl:    insulin glargine (LANTUS) 100 UNIT/ML Solostar Pen, Inject 13 Units into the skin daily. (Patient taking differently: Inject 12 Units into the skin at bedtime.), Disp: 15 mL, Rfl: 2   Insulin Pen Needle (BD PEN NEEDLE NANO 2ND GEN) 32G X 4 MM MISC, See admin instructions., Disp: , Rfl:    iron sucrose in sodium chloride 0.9 % 100 mL, Inject 100 mg into the vein as needed (based on labs)., Disp: , Rfl:    levETIRAcetam (KEPPRA) 500 MG tablet, TAKE 1 TABLET(500 MG) BY MOUTH AT BEDTIME, Disp: 90 tablet, Rfl: 1   Lidocaine 4 % GEL, Apply 1 Application topically daily as needed (pain)., Disp: , Rfl:    losartan (COZAAR) 50 MG tablet, Take 50 mg by mouth daily., Disp: , Rfl:    melatonin 5 MG TABS, Take 5 mg by mouth at bedtime., Disp: , Rfl:    Methoxy PEG-Epoetin Beta (MIRCERA IJ), Mircera, Disp: , Rfl:    methylphenidate (RITALIN) 5 MG tablet, Take 1 tablet (5 mg total) by mouth 2 (two) times daily with breakfast and lunch. Due to stroke attention (Patient taking differently: Take 5 mg by mouth daily. Due to stroke attention), Disp: 180 tablet, Rfl: 0   mometasone-formoterol (DULERA)  200-5 MCG/ACT AERO, Inhale 2 puffs into the lungs 2 (two) times daily., Disp: 3 each, Rfl: 2   mupirocin ointment (BACTROBAN) 2 %, Apply 1 application. topically 2 (two) times daily., Disp: 60 g, Rfl: 2   Nutritional  Supplements (,FEEDING SUPPLEMENT, PROSOURCE PLUS) liquid, Take 30 mLs by mouth 2 (two) times daily between meals. (Patient not taking: Reported on 04/14/2022), Disp: 887 mL, Rfl: 0   ondansetron (ZOFRAN) 8 MG tablet, Take 8 mg by mouth 2 (two) times daily as needed for refractory nausea / vomiting., Disp: , Rfl:    ondansetron (ZOFRAN-ODT) 4 MG disintegrating tablet, Take 1 tablet (4 mg total) by mouth every 8 (eight) hours as needed for nausea or vomiting., Disp: 20 tablet, Rfl: 1   OneTouch Delica Lancets 61W MISC, Use as directed to check blood sugars before breakfast and dinner dx: e11.65, Disp: 100 each, Rfl: 11   oxyCODONE (OXY IR/ROXICODONE) 5 MG immediate release tablet, Take 1 tablet (5 mg total) by mouth every 4 (four) hours as needed for up to 7 days for severe pain., Disp: 20 tablet, Rfl: 0   pantoprazole (PROTONIX) 40 MG tablet, Take 1 tablet (40 mg total) by mouth daily., Disp: 90 tablet, Rfl: 2   rosuvastatin (CRESTOR) 20 MG tablet, Take 1 tablet (20 mg total) by mouth daily., Disp: 90 tablet, Rfl: 2   senna-docusate (SENOKOT-S) 8.6-50 MG tablet, Take 2 tablets by mouth 2 (two) times daily. (Patient taking differently: Take 1-2 tablets by mouth daily as needed for mild constipation or moderate constipation.), Disp: 360 tablet, Rfl: 2   sevelamer carbonate (RENVELA) 0.8 g PACK packet, Take 0.8 g by mouth 3 (three) times daily with meals., Disp: , Rfl:    traMADol (ULTRAM) 50 MG tablet, TAKE 1 TABLET(50 MG) BY MOUTH EVERY 12 HOURS FOR 5 DAYS, Disp: 10 tablet, Rfl: 0   Allergies  Allergen Reactions   Food Anaphylaxis    Peanuts; Almonds   Wheat Bran     Upset stomach    Statins Itching and Other (See Comments)    Generalized aches- tolerates crestor    Pork-Derived  Products     Does not eat pork    Shellfish Allergy Other (See Comments)    Mouth gets raw   Sitagliptin Other (See Comments)    Januiva - unknown reaction    Tetracycline Other (See Comments)    Raw mouth   Contrast Media [Iodinated Contrast Media] Rash    Happened during CT scan over 30 years ago     Review of Systems  Constitutional: Negative.   Respiratory: Negative.    Cardiovascular: Negative.   Gastrointestinal: Negative.   Neurological: Negative.      Today's Vitals   04/13/22 1434  BP: 116/60  Pulse: 74  Temp: 98.4 F (36.9 C)  TempSrc: Oral  SpO2: 94%   There is no height or weight on file to calculate BMI.   Objective:  Physical Exam Vitals reviewed.  Constitutional:      General: She is not in acute distress.    Appearance: Normal appearance. She is obese.  Cardiovascular:     Rate and Rhythm: Normal rate and regular rhythm.     Pulses: Normal pulses.     Heart sounds: Normal heart sounds. No murmur heard. Pulmonary:     Effort: Pulmonary effort is normal. No respiratory distress.     Breath sounds: Normal breath sounds. No wheezing.  Musculoskeletal:     Comments: Right upper and lower extremity weakness. Right hand flaccid  Skin:    General: Skin is warm and dry.  Neurological:     General: No focal deficit present.     Mental Status: She is alert and oriented to person, place, and time.  Cranial Nerves: No cranial nerve deficit.     Motor: Weakness (left upper and lower extremity, she is also having weakness to her right arm where she hs the fistula) present.  Psychiatric:        Mood and Affect: Mood normal.        Behavior: Behavior normal.        Thought Content: Thought content normal.        Judgment: Judgment normal.         Assessment And Plan:     1. Pre-op evaluation Comments: Pending labs she is cleared for surgery with moderate risk. - Protime-INR  2. Other osteomyelitis of right foot ([place]) Comments: She is to have  surgical amputation later this week.   3. Uncontrolled type 2 diabetes mellitus with hyperglycemia ([place]) Comments: HgbA1c had been uncontrolled, will check level today. Continue current medications.  - CMP14+EGFR - Hemoglobin A1c  4. Pure hypercholesterolemia Comments: Continue statin, tolerating well.  - CBC with Diff - CMP14+EGFR - Lipid panel  5. History of CVA with residual deficit - Protime-INR  6. ESRD on dialysis [facility]) Comments: Overall doing well with her dialysis, continues MWF dialysis     Patient was given opportunity to ask questions. Patient verbalized understanding of the plan and was able to repeat key elements of the plan. All questions were answered to their satisfaction.  Minette Brine, FNP   I, Minette Brine, FNP, have reviewed all documentation for this visit. The documentation on 04/13/22 for the exam, diagnosis, procedures, and orders are all accurate and complete.   IF YOU HAVE BEEN REFERRED TO A SPECIALIST, IT MAY TAKE 1-2 WEEKS TO SCHEDULE/PROCESS THE REFERRAL. IF YOU HAVE NOT HEARD FROM US/SPECIALIST IN TWO WEEKS, PLEASE GIVE [place] A CALL AT [phone].   THE PATIENT IS ENCOURAGED TO PRACTICE SOCIAL DISTANCING DUE TO THE COVID-19 PANDEMIC.

## 2022-04-13 NOTE — Progress Notes (Signed)
Anesthesia Chart Review   Case: [id number] Date/Time: 04/15/22 1545   Procedure: AMPUTATION  LST TOERIGHT FOOT (Right: Toe)   Anesthesia type: Choice   Pre-op diagnosis: OSTEOMYELITIS   Location: [facility] [facility] / [facility]   Surgeons: Criselda Peaches, DPM       DISCUSSION:71 y.o. never smoker with h/o HTN, DM II, CHF, nonobstructive CAD on cath 2018, CVA/TIA June 2022 with left sided weakness, CKD Stage 5 on dialysis, osteomyelitis scheduled for above procedure 04/15/2022 with Lanae Crumbly, DPM.   Pt last seen by cardiology 01/25/2022. Stable at this visit with 1 year follow up.   Per Dr. Sherryle Lis ok to continue Plavix.   Dialysis MWF, she will go to dialysis in the morning before surgery.   Evaluate DOS, Labs DOS. Hemoglobin 7.8 03/07/22, evaluate DOS.  VS: Ht 5' 2.5" (1.588 m)   Wt 81.6 kg   BMI 32.40 kg/m   PROVIDERS: [name], MD is PCP   Vascular surgeon is Dr. Waynetta Sandy LABS: Labs reviewed: Acceptable for surgery. (all labs ordered are listed, but only abnormal results are displayed)  Labs Reviewed - No data to display   IMAGES:   EKG: 03/09/2022 Rate 71 bpm  Sinus rhythm  LAD Borderline low voltage, extremity leads Consider anterior infarct  CV: Echo 03/11/2021 1. Left ventricular ejection fraction, by estimation, is 50%. The left  ventricle has low normal function. The left ventricle has no regional wall  motion abnormalities. There is mild left ventricular hypertrophy. Left  ventricular diastolic parameters are   consistent with Grade II diastolic dysfunction (pseudonormalization).  Elevated left ventricular end-diastolic pressure. The E/e' is 76.   2. Right ventricular systolic function is normal. The right ventricular  size is normal. There is moderately elevated pulmonary artery systolic  pressure. The estimated right ventricular systolic pressure is 88.8 mmHg.   3. Left atrial size was mildly dilated.   4. The mitral valve is grossly  normal. Trivial mitral valve  regurgitation. No evidence of mitral stenosis.   5. The aortic valve is grossly normal. There is mild calcification of the  aortic valve. Aortic valve regurgitation is not visualized. Mild aortic  valve sclerosis is present, with no evidence of aortic valve stenosis.   6. The inferior vena cava is dilated in size with >50% respiratory  variability, suggesting right atrial pressure of 8 mmHg.   Cardiac Cath 04/04/2017 Heavily calcified eccentric 30 to 40% proximal LAD. Widely patent circumflex, ramus intermedius, and native right coronary. Normal left ventricular size and function with EF 60%. Elevated left ventricular end-diastolic pressure consistent with diastolic heart failure.   RECOMMENDATIONS:   Aggressive risk factor modification Blood pressure control Weight loss Consider sleep study to rule out sleep apnea (based upon airway obstruction noted during the procedure after receiving low-dose sedation). Past Medical History:  Diagnosis Date   Acute GI bleeding    Allergy    Anemia    Anterior chest wall pain    Appendicitis 1965   Asthma    Body mass index 37.0-37.9, adult    Breast pain    Cataract    both eyes   CHF (congestive heart failure) ([place])    Chronic kidney disease    stage 5 - on dialysis   Cognitive change 04/20/2021   r/t cva 06/1693   Complication of anesthesia    Memory loss after general   Dehydration 2014   Deviated septum 1971   Diabetes mellitus    Dysphagia due  to old stroke    easy to get strangled when eating   Dyspnea 2014   Extrinsic asthma    WITH ASTHMA ATTACK   Fibroid 1980   GERD (gastroesophageal reflux disease)    Heart murmur    History of migraine    History of seizure    with stroke   Hx gestational diabetes    Hyperlipidemia    Hypertension 2014   Inguinal hernia 1959   Malaise and fatigue 2014   Non-IgE mediated allergic asthma 2014   Obesity    Pelvic pain    Pregnancy, high-risk 1985    Stroke ([place]) 04/20/2021   (CVA) of right basal ganglia   Tonsillitis 1968   Uterine fibroid 1980   Visual field defect    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    Past Surgical History:  Procedure Laterality Date   ABDOMINAL AORTOGRAM W/LOWER EXTREMITY N/A 02/21/2022   Procedure: ABDOMINAL AORTOGRAM W/LOWER EXTREMITY;  Surgeon: Waynetta Sandy, MD;  Location: [facility];  Service: Cardiovascular;  Laterality: N/A;   APPENDECTOMY  0932   BASCILIC VEIN TRANSPOSITION Right 04/30/2021   Procedure: RIGHT FIRST STAGE [place];  Surgeon: Angelia Mould, MD;  Location: [facility];  Service: Vascular;  Laterality: Right;   CESAREAN SECTION  1985   COLONOSCOPY     ESOPHAGOGASTRODUODENOSCOPY (EGD) WITH PROPOFOL N/A 04/18/2021   Procedure: ESOPHAGOGASTRODUODENOSCOPY (EGD) WITH PROPOFOL;  Surgeon: Gatha Mayer, MD;  Location: [facility];  Service: Endoscopy;  Laterality: N/A;   EYE SURGERY     bilateral cataract    FLEXIBLE SIGMOIDOSCOPY N/A 04/18/2021   Procedure: FLEXIBLE SIGMOIDOSCOPY;  Surgeon: Gatha Mayer, MD;  Location: [facility];  Service: Endoscopy;  Laterality: N/A;   HERNIA REPAIR  1959   IR FLUORO GUIDE CV LINE RIGHT  03/18/2021   IR US GUIDE VASC ACCESS RIGHT  03/18/2021   LEFT HEART CATH AND CORONARY ANGIOGRAPHY N/A 04/04/2017   Procedure: Left Heart Cath and Coronary Angiography;  Surgeon: Belva Crome, MD;  Location: [facility];  Service: Cardiovascular;  Laterality: N/A;   [place], 2004, 2007   PERIPHERAL VASCULAR THROMBECTOMY  02/21/2022   Procedure: PERIPHERAL VASCULAR THROMBECTOMY;  Surgeon: Waynetta Sandy, MD;  Location: [facility];  Service: Cardiovascular;;  Right Tibial   RHINOPLASTY  1971   ROTATOR CUFF REPAIR  2003   SURGICAL REPAIR OF HEMORRHAGE  2015   TONSILLECTOMY  1968    MEDICATIONS: No current facility-administered medications for this encounter.    acetaminophen (TYLENOL) 500 MG tablet    albuterol (VENTOLIN HFA) 108 (90 Base) MCG/ACT inhaler   Camphor-Menthol-Methyl Sal (SALONPAS) 3.10-29-08 % PTCH   carvedilol (COREG) 3.125 MG tablet   clopidogrel (PLAVIX) 75 MG tablet   Continuous Blood Gluc Receiver (FREESTYLE LIBRE 2 READER) DEVI   diclofenac Sodium (VOLTAREN) 1 % GEL   diphenhydrAMINE (BENADRYL) 2 % cream   diphenhydrAMINE (BENADRYL) 25 MG tablet   famotidine (PEPCID) 20 MG tablet   fluticasone (FLONASE) 50 MCG/ACT nasal spray   hydrALAZINE (APRESOLINE) 10 MG tablet   hydrocortisone (ANUSOL-HC) 2.5 % rectal cream   hydrocortisone cream 1 %   hydrOXYzine (ATARAX/VISTARIL) 25 MG tablet   insulin aspart (NOVOLOG) 100 UNIT/ML injection   insulin glargine (LANTUS) 100 UNIT/ML Solostar Pen   Insulin Pen Needle (BD PEN NEEDLE NANO 2ND GEN) 32G X 4 MM MISC   iron sucrose in sodium chloride 0.9 % 100 mL   levETIRAcetam (KEPPRA)  500 MG tablet   losartan (COZAAR) 50 MG tablet   melatonin 3 MG TABS tablet   Methoxy PEG-Epoetin Beta (MIRCERA IJ)   methylphenidate (RITALIN) 5 MG tablet   mometasone-formoterol (DULERA) 200-5 MCG/ACT AERO   mupirocin ointment (BACTROBAN) 2 %   Nutritional Supplements (,FEEDING SUPPLEMENT, PROSOURCE PLUS) liquid   ondansetron (ZOFRAN) 4 MG tablet   ondansetron (ZOFRAN-ODT) 4 MG disintegrating tablet   OneTouch Delica Lancets 75S MISC   oxyCODONE (OXY IR/ROXICODONE) 5 MG immediate release tablet   pantoprazole (PROTONIX) 40 MG tablet   phenylephrine (,USE FOR PREPARATION-H,) 0.25 % suppository   polyethylene glycol powder (GLYCOLAX/MIRALAX) 17 GM/SCOOP powder   rosuvastatin (CRESTOR) 20 MG tablet   senna-docusate (SENOKOT-S) 8.6-50 MG tablet   sevelamer carbonate (RENVELA) 0.8 g PACK packet   traMADol (ULTRAM) 50 MG tablet    [facility] Ward, PA-C [facility] Pre-Surgical Testing [phone]

## 2022-04-13 NOTE — Patient Instructions (Signed)
Toe Amputation A toe amputation is a surgical procedure to remove all or part of a toe. A person may have this procedure if: Tissue in the toe is dying because of poor blood supply. There is a toe injury, a severe infection, or a cancerous tumor in the toe. Removing the toe keeps nearby tissue healthy. If the toe is infected, removing it helps to keep the infection from spreading. Tell a health care provider about: Any allergies you have. All medicines you are taking, including vitamins, herbs, eye drops, creams, and over-the-counter medicines. Any problems you or family members have had with anesthetic medicines. Any bleeding problems you have. Any surgeries you have had. Any medical conditions you have. Whether you are pregnant or may be pregnant. What are the risks? Generally, this is a safe procedure. However, problems may occur, including: Bleeding. Buildup of blood and fluid (hematoma). Infection. Allergic reactions to medicines. Tissue death in the flap of skin (flap necrosis). Trouble with healing. Minor changes in the way that you walk (your gait). Feeling pain in the area where the toe was removed. This is called phantom pain. It is rare. What happens before the procedure? When to stop eating and drinking Follow instructions from your health care provider about what you may eat and drink before your procedure. These may include: 8 hours before your procedure Stop eating most foods. Do not eat meat, fried foods, or fatty foods. Eat only light foods, such as toast or crackers. All liquids are okay except energy drinks and alcohol. 6 hours before your procedure Stop eating. Drink only clear liquids, such as water, clear fruit juice, black coffee, plain tea, and sports drinks. Do not drink energy drinks or alcohol. 2 hours before your procedure Stop drinking all liquids. You may be allowed to take medicines with small sips of water. If you do not follow your health care  provider's instructions, your procedure may be delayed or canceled. Medicines Ask your health care provider about: Changing or stopping your regular medicines. This is especially important if you are taking diabetes medicines or blood thinners. Taking medicines such as aspirin and ibuprofen. These medicines can thin your blood. Do not take these medicines unless your health care provider tells you to take them. Taking over-the-counter medicines, vitamins, herbs, and supplements. General instructions Do not use any products that contain nicotine or tobacco for at least 4 weeks before the procedure. These products include cigarettes, chewing tobacco, and vaping devices, such as e-cigarettes. If you need help quitting, ask your health care provider. Ask your health care provider: How your surgery site will be marked. What steps will be taken to help prevent infection. These may include: Washing skin with a germ-killing soap. Taking antibiotic medicine. If you will be going home right after the procedure, plan to have a responsible adult: Take you home from the hospital or clinic. You will not be allowed to drive. Care for you for the time you are told. What happens during the procedure? An IV will be inserted into one of your veins. You will be given one or more of the following: A medicine to help you relax (sedative). A medicine to numb the area (local anesthetic). A medicine to make you fall asleep (general anesthetic). A medicine that is injected into an area of your body to numb everything below the injection site (regional anesthetic). Your surgeon will mark the area of your toe for removal, and then make an incision in your toe. Your surgeon will  remove dead tissue and bone. Nerve and blood vessels will be tied or heated with a tool (cauterized) to stop bleeding. The area will be drained and cleaned. The open wound will be covered with a flap of skin, if possible. The incision may  be: Closed with stitches (sutures). Left open to heal. Packed with gauze and covered with a bandage (dressing). Tissue samples may be removed and sent to a lab to be examined under a microscope (biopsy). The procedure may vary among health care providers and hospitals. What happens after the procedure? Your blood pressure, heart rate, breathing rate, and blood oxygen level will be monitored until you leave the hospital or clinic. Your foot will be raised (elevated) to relieve swelling. You will be given pain medicine as needed. You may also be given antibiotics. A member of your health care team will help you move around as soon as possible. If you were given a sedative during the procedure, it can affect you for several hours. Do not drive or operate machinery until your health care provider says that it is safe. Summary A toe amputation is a surgical procedure to remove all or part of a toe. Before the procedure, follow instructions from your health care provider about taking medicines and about eating or drinking restrictions. During the procedure, steps will be taken to reduce your risk of infection. After the procedure, you will be given pain medicines and antibiotics. After the procedure, your foot will be raised (elevated) to relieve swelling. This information is not intended to replace advice given to you by your health care provider. Make sure you discuss any questions you have with your health care provider. Document Revised: 05/24/2021 Document Reviewed: 05/24/2021 Elsevier Patient Education  [place].

## 2022-04-14 LAB — LIPID PANEL
Chol/HDL Ratio: 2.8 ratio (ref 0.0–4.4)
Cholesterol, Total: 96 mg/dL — ABNORMAL LOW (ref 100–199)
HDL: 34 mg/dL — ABNORMAL LOW (ref >39)
LDL Chol Calc (NIH): 39 mg/dL (ref 0–99)
Triglycerides: 130 mg/dL (ref 0–149)
VLDL Cholesterol Cal: 23 mg/dL (ref 5–40)

## 2022-04-14 LAB — CBC WITH DIFFERENTIAL/PLATELET
Basophils Absolute: 0.1 10*3/uL (ref 0.0–0.2)
Basos: 1 %
EOS (ABSOLUTE): 0.4 10*3/uL (ref 0.0–0.4)
Eos: 5 %
Hematocrit: 27.6 % — ABNORMAL LOW (ref 34.0–46.6)
Hemoglobin: 9.3 g/dL — ABNORMAL LOW (ref 11.1–15.9)
Immature Grans (Abs): 0 10*3/uL (ref 0.0–0.1)
Immature Granulocytes: 0 %
Lymphocytes Absolute: 2.4 10*3/uL (ref 0.7–3.1)
Lymphs: 27 %
MCH: 30.1 pg (ref 26.6–33.0)
MCHC: 33.7 g/dL (ref 31.5–35.7)
MCV: 89 fL (ref 79–97)
Monocytes Absolute: 1.1 10*3/uL — ABNORMAL HIGH (ref 0.1–0.9)
Monocytes: 13 %
Neutrophils Absolute: 4.9 10*3/uL (ref 1.4–7.0)
Neutrophils: 54 %
Platelets: 341 10*3/uL (ref 150–450)
RBC: 3.09 x10E6/uL — ABNORMAL LOW (ref 3.77–5.28)
RDW: 16.1 % — ABNORMAL HIGH (ref 11.7–15.4)
WBC: 8.9 10*3/uL (ref 3.4–10.8)

## 2022-04-14 LAB — CMP14+EGFR
ALT: 18 IU/L (ref 0–32)
AST: 16 IU/L (ref 0–40)
Albumin/Globulin Ratio: 1.2 (ref 1.2–2.2)
Albumin: 3.9 g/dL (ref 3.7–4.7)
Alkaline Phosphatase: 88 IU/L (ref 44–121)
BUN/Creatinine Ratio: 3 — ABNORMAL LOW (ref 12–28)
BUN: 12 mg/dL (ref 8–27)
Bilirubin Total: 0.3 mg/dL (ref 0.0–1.2)
CO2: 31 mmol/L — ABNORMAL HIGH (ref 20–29)
Calcium: 8.4 mg/dL — ABNORMAL LOW (ref 8.7–10.3)
Chloride: 96 mmol/L (ref 96–106)
Creatinine, Ser: 3.92 mg/dL — ABNORMAL HIGH (ref 0.57–1.00)
Globulin, Total: 3.2 g/dL (ref 1.5–4.5)
Glucose: 173 mg/dL — ABNORMAL HIGH (ref 70–99)
Potassium: 4.1 mmol/L (ref 3.5–5.2)
Sodium: 142 mmol/L (ref 134–144)
Total Protein: 7.1 g/dL (ref 6.0–8.5)
eGFR: 12 mL/min/{1.73_m2} — ABNORMAL LOW (ref >59)

## 2022-04-14 LAB — HEMOGLOBIN A1C
Est. average glucose Bld gHb Est-mCnc: 128 mg/dL
Hgb A1c MFr Bld: 6.1 % — ABNORMAL HIGH (ref 4.8–5.6)

## 2022-04-14 LAB — PROTIME-INR
INR: 1.1 (ref 0.9–1.2)
Prothrombin Time: 11.3 s (ref 9.1–12.0)

## 2022-04-15 ENCOUNTER — Inpatient Hospital Stay (HOSPITAL_COMMUNITY): Payer: Medicare PPO | Admitting: Physician Assistant

## 2022-04-15 ENCOUNTER — Inpatient Hospital Stay (HOSPITAL_BASED_OUTPATIENT_CLINIC_OR_DEPARTMENT_OTHER): Payer: Medicare PPO | Admitting: Physician Assistant

## 2022-04-15 ENCOUNTER — Encounter (HOSPITAL_COMMUNITY)
Admission: RE | Disposition: A | Discharge status: Home Health | Payer: Self-pay | Source: Home / Self Care | Attending: Internal Medicine

## 2022-04-15 ENCOUNTER — Encounter (HOSPITAL_COMMUNITY): Payer: Self-pay | Admitting: Podiatry

## 2022-04-15 ENCOUNTER — Observation Stay (HOSPITAL_COMMUNITY)
Admission: RE | Admit: 2022-04-15 | Discharge: 2022-04-17 | Disposition: A | Discharge status: Home Health | Payer: Medicare PPO | Attending: Internal Medicine | Admitting: Internal Medicine

## 2022-04-15 ENCOUNTER — Other Ambulatory Visit: Payer: Self-pay

## 2022-04-15 DIAGNOSIS — E1152 Type 2 diabetes mellitus with diabetic peripheral angiopathy with gangrene: Principal | ICD-10-CM | POA: Insufficient documentation

## 2022-04-15 DIAGNOSIS — N185 Chronic kidney disease, stage 5: Secondary | ICD-10-CM

## 2022-04-15 DIAGNOSIS — N1832 Chronic kidney disease, stage 3b: Principal | ICD-10-CM

## 2022-04-15 DIAGNOSIS — E1122 Type 2 diabetes mellitus with diabetic chronic kidney disease: Principal | ICD-10-CM | POA: Insufficient documentation

## 2022-04-15 DIAGNOSIS — E1169 Type 2 diabetes mellitus with other specified complication: Secondary | ICD-10-CM

## 2022-04-15 DIAGNOSIS — Z794 Long term (current) use of insulin: Secondary | ICD-10-CM

## 2022-04-15 DIAGNOSIS — M86671 Other chronic osteomyelitis, right ankle and foot: Secondary | ICD-10-CM | POA: Diagnosis not present

## 2022-04-15 DIAGNOSIS — J45909 Unspecified asthma, uncomplicated: Secondary | ICD-10-CM | POA: Diagnosis not present

## 2022-04-15 DIAGNOSIS — R531 Weakness: Secondary | ICD-10-CM | POA: Insufficient documentation

## 2022-04-15 DIAGNOSIS — M869 Osteomyelitis, unspecified: Secondary | ICD-10-CM | POA: Diagnosis not present

## 2022-04-15 DIAGNOSIS — L508 Other urticaria: Secondary | ICD-10-CM | POA: Diagnosis not present

## 2022-04-15 DIAGNOSIS — D631 Anemia in chronic kidney disease: Secondary | ICD-10-CM | POA: Diagnosis present

## 2022-04-15 DIAGNOSIS — E669 Obesity, unspecified: Secondary | ICD-10-CM | POA: Diagnosis not present

## 2022-04-15 DIAGNOSIS — N2581 Secondary hyperparathyroidism of renal origin: Secondary | ICD-10-CM | POA: Diagnosis not present

## 2022-04-15 DIAGNOSIS — Z992 Dependence on renal dialysis: Secondary | ICD-10-CM | POA: Diagnosis not present

## 2022-04-15 DIAGNOSIS — Z7902 Long term (current) use of antithrombotics/antiplatelets: Secondary | ICD-10-CM | POA: Insufficient documentation

## 2022-04-15 DIAGNOSIS — E1165 Type 2 diabetes mellitus with hyperglycemia: Secondary | ICD-10-CM | POA: Diagnosis present

## 2022-04-15 DIAGNOSIS — I2510 Atherosclerotic heart disease of native coronary artery without angina pectoris: Secondary | ICD-10-CM | POA: Insufficient documentation

## 2022-04-15 DIAGNOSIS — Z8673 Personal history of transient ischemic attack (TIA), and cerebral infarction without residual deficits: Secondary | ICD-10-CM | POA: Insufficient documentation

## 2022-04-15 DIAGNOSIS — M86171 Other acute osteomyelitis, right ankle and foot: Secondary | ICD-10-CM | POA: Insufficient documentation

## 2022-04-15 DIAGNOSIS — I5032 Chronic diastolic (congestive) heart failure: Secondary | ICD-10-CM | POA: Diagnosis present

## 2022-04-15 DIAGNOSIS — I96 Gangrene, not elsewhere classified: Secondary | ICD-10-CM | POA: Diagnosis not present

## 2022-04-15 DIAGNOSIS — N186 End stage renal disease: Secondary | ICD-10-CM

## 2022-04-15 DIAGNOSIS — I132 Hypertensive heart and chronic kidney disease with heart failure and with stage 5 chronic kidney disease, or end stage renal disease: Secondary | ICD-10-CM | POA: Insufficient documentation

## 2022-04-15 DIAGNOSIS — Z79899 Other long term (current) drug therapy: Secondary | ICD-10-CM | POA: Diagnosis not present

## 2022-04-15 DIAGNOSIS — L97519 Non-pressure chronic ulcer of other part of right foot with unspecified severity: Secondary | ICD-10-CM | POA: Diagnosis not present

## 2022-04-15 DIAGNOSIS — R112 Nausea with vomiting, unspecified: Secondary | ICD-10-CM | POA: Insufficient documentation

## 2022-04-15 HISTORY — PX: AMPUTATION TOE: SHX6595

## 2022-04-15 HISTORY — DX: Personal history of other specified conditions: Z87.898

## 2022-04-15 HISTORY — DX: Personal history of other diseases of the nervous system and sense organs: Z86.69

## 2022-04-15 HISTORY — DX: Dysphagia following cerebral infarction: I69.391

## 2022-04-15 HISTORY — DX: Other complications of anesthesia, initial encounter: T88.59XA

## 2022-04-15 HISTORY — DX: Unspecified visual field defects: H53.40

## 2022-04-15 LAB — BASIC METABOLIC PANEL
Anion gap: 9 (ref 5–15)
BUN: 13 mg/dL (ref 8–23)
CO2: 29 mmol/L (ref 22–32)
Calcium: 8.3 mg/dL — ABNORMAL LOW (ref 8.9–10.3)
Chloride: 98 mmol/L (ref 98–111)
Creatinine, Ser: 3.79 mg/dL — ABNORMAL HIGH (ref 0.44–1.00)
GFR, Estimated: 12 mL/min — ABNORMAL LOW (ref >60)
Glucose, Bld: 151 mg/dL — ABNORMAL HIGH (ref 70–99)
Potassium: 3.8 mmol/L (ref 3.5–5.1)
Sodium: 136 mmol/L (ref 135–145)

## 2022-04-15 LAB — GLUCOSE, CAPILLARY
Glucose-Capillary: 152 mg/dL — ABNORMAL HIGH (ref 70–99)
Glucose-Capillary: 151 mg/dL — ABNORMAL HIGH (ref 70–99)
Glucose-Capillary: 185 mg/dL — ABNORMAL HIGH (ref 70–99)

## 2022-04-15 LAB — CBC
HCT: 27.4 % — ABNORMAL LOW (ref 36.0–46.0)
Hemoglobin: 9 g/dL — ABNORMAL LOW (ref 12.0–15.0)
MCH: 29.9 pg (ref 26.0–34.0)
MCHC: 32.8 g/dL (ref 30.0–36.0)
MCV: 91 fL (ref 80.0–100.0)
Platelets: 290 10*3/uL (ref 150–400)
RBC: 3.01 MIL/uL — ABNORMAL LOW (ref 3.87–5.11)
RDW: 17.3 % — ABNORMAL HIGH (ref 11.5–15.5)
WBC: 11.3 10*3/uL — ABNORMAL HIGH (ref 4.0–10.5)
nRBC: 0 % (ref 0.0–0.2)

## 2022-04-15 LAB — HEMOGLOBIN A1C
Hgb A1c MFr Bld: 5.9 % — ABNORMAL HIGH (ref 4.8–5.6)
Mean Plasma Glucose: 122.63 mg/dL

## 2022-04-15 SURGERY — AMPUTATION, TOE
Anesthesia: Monitor Anesthesia Care | Site: Toe | Laterality: Right

## 2022-04-15 MED ORDER — ONDANSETRON HCL 4 MG/2ML IJ SOLN
INTRAMUSCULAR | Status: AC
  Filled 2022-04-15: qty 2

## 2022-04-15 MED ORDER — ACETAMINOPHEN 500 MG PO TABS: 1000.0000 mg | ORAL_TABLET | Freq: Once | ORAL | Status: DC

## 2022-04-15 MED ORDER — OXYCODONE HCL 5 MG/5ML PO SOLN: 5.0000 mg | Freq: Once | ORAL | Status: DC | PRN

## 2022-04-15 MED ORDER — PROSOURCE PLUS PO LIQD
30.0000 mL | Freq: Two times a day (BID) | ORAL | Status: DC
  Administered 2022-04-16 (×2): 30 mL via ORAL
  Filled 2022-04-15 (×2): qty 30

## 2022-04-15 MED ORDER — PROPOFOL 500 MG/50ML IV EMUL
INTRAVENOUS | Status: DC | PRN
  Administered 2022-04-15: 120 ug/kg/min via INTRAVENOUS

## 2022-04-15 MED ORDER — CARVEDILOL 3.125 MG PO TABS
3.1250 mg | ORAL_TABLET | Freq: Two times a day (BID) | ORAL | Status: DC
  Administered 2022-04-16 – 2022-04-17 (×3): 3.125 mg via ORAL
  Filled 2022-04-15 (×3): qty 1

## 2022-04-15 MED ORDER — CHLORHEXIDINE GLUCONATE 0.12 % MT SOLN
15.0000 mL | Freq: Once | OROMUCOSAL | Status: AC
  Administered 2022-04-15: 15 mL via OROMUCOSAL

## 2022-04-15 MED ORDER — OXYCODONE HCL 5 MG PO TABS: 5.0000 mg | ORAL_TABLET | Freq: Once | ORAL | Status: DC | PRN

## 2022-04-15 MED ORDER — OXYCODONE HCL 5 MG PO TABS
5.0000 mg | ORAL_TABLET | Freq: Four times a day (QID) | ORAL | Status: DC | PRN
  Administered 2022-04-16: 5 mg via ORAL
  Filled 2022-04-15: qty 1

## 2022-04-15 MED ORDER — ALBUTEROL SULFATE HFA 108 (90 BASE) MCG/ACT IN AERS: 2.0000 | INHALATION_SPRAY | RESPIRATORY_TRACT | Status: DC | PRN

## 2022-04-15 MED ORDER — MIDAZOLAM HCL 2 MG/2ML IJ SOLN: 1.0000 mg | Freq: Once | INTRAMUSCULAR | Status: DC

## 2022-04-15 MED ORDER — ONDANSETRON 4 MG PO TBDP
4.0000 mg | ORAL_TABLET | Freq: Three times a day (TID) | ORAL | Status: DC | PRN
  Administered 2022-04-16: 4 mg via ORAL
  Filled 2022-04-15: qty 1

## 2022-04-15 MED ORDER — ACETAMINOPHEN 500 MG PO TABS
1000.0000 mg | ORAL_TABLET | Freq: Two times a day (BID) | ORAL | Status: DC | PRN
  Administered 2022-04-16: 1000 mg via ORAL
  Filled 2022-04-15 (×3): qty 2

## 2022-04-15 MED ORDER — ROSUVASTATIN CALCIUM 20 MG PO TABS
20.0000 mg | ORAL_TABLET | Freq: Every day | ORAL | Status: DC
  Administered 2022-04-15 – 2022-04-17 (×3): 20 mg via ORAL
  Filled 2022-04-15 (×3): qty 1

## 2022-04-15 MED ORDER — HYDRALAZINE HCL 10 MG PO TABS
10.0000 mg | ORAL_TABLET | Freq: Three times a day (TID) | ORAL | Status: DC | PRN
  Administered 2022-04-16: 10 mg via ORAL
  Filled 2022-04-15 (×2): qty 1

## 2022-04-15 MED ORDER — FENTANYL CITRATE PF 50 MCG/ML IJ SOSY: 25.0000 ug | PREFILLED_SYRINGE | INTRAMUSCULAR | Status: DC | PRN

## 2022-04-15 MED ORDER — SENNOSIDES-DOCUSATE SODIUM 8.6-50 MG PO TABS: 1.0000 | ORAL_TABLET | Freq: Every day | ORAL | Status: DC | PRN

## 2022-04-15 MED ORDER — PANTOPRAZOLE SODIUM 40 MG PO TBEC
40.0000 mg | DELAYED_RELEASE_TABLET | Freq: Every day | ORAL | Status: DC
  Administered 2022-04-16 – 2022-04-17 (×2): 40 mg via ORAL
  Filled 2022-04-15 (×2): qty 1

## 2022-04-15 MED ORDER — LACTATED RINGERS IV SOLN: INTRAVENOUS | Status: DC

## 2022-04-15 MED ORDER — HEPARIN SODIUM (PORCINE) 5000 UNIT/ML IJ SOLN: 5000.0000 [IU] | Freq: Three times a day (TID) | INTRAMUSCULAR | Status: DC

## 2022-04-15 MED ORDER — CLOPIDOGREL BISULFATE 75 MG PO TABS
75.0000 mg | ORAL_TABLET | Freq: Every day | ORAL | Status: DC
  Administered 2022-04-15 – 2022-04-17 (×3): 75 mg via ORAL
  Filled 2022-04-15 (×3): qty 1

## 2022-04-15 MED ORDER — TRAMADOL HCL 50 MG PO TABS
50.0000 mg | ORAL_TABLET | Freq: Four times a day (QID) | ORAL | Status: DC | PRN
  Administered 2022-04-16 – 2022-04-17 (×2): 50 mg via ORAL
  Filled 2022-04-15 (×3): qty 1

## 2022-04-15 MED ORDER — LIDOCAINE 4 % EX CREA: TOPICAL_CREAM | Freq: Every day | CUTANEOUS | Status: DC | PRN

## 2022-04-15 MED ORDER — MOMETASONE FURO-FORMOTEROL FUM 200-5 MCG/ACT IN AERO: 2.0000 | INHALATION_SPRAY | Freq: Two times a day (BID) | RESPIRATORY_TRACT | 2 refills | Status: DC

## 2022-04-15 MED ORDER — BUPIVACAINE HCL (PF) 0.5 % IJ SOLN
INTRAMUSCULAR | Status: DC | PRN
  Administered 2022-04-15: 30 mL

## 2022-04-15 MED ORDER — MOMETASONE FURO-FORMOTEROL FUM 200-5 MCG/ACT IN AERO
2.0000 | INHALATION_SPRAY | Freq: Two times a day (BID) | RESPIRATORY_TRACT | Status: DC
  Administered 2022-04-15 – 2022-04-16 (×3): 2 via RESPIRATORY_TRACT
  Filled 2022-04-15: qty 8.8

## 2022-04-15 MED ORDER — FLUTICASONE PROPIONATE 50 MCG/ACT NA SUSP
2.0000 | Freq: Every day | NASAL | Status: DC | PRN
Start: 2022-04-15 — End: 2022-04-18

## 2022-04-15 MED ORDER — KETOTIFEN FUMARATE 0.025 % OP SOLN: 1.0000 [drp] | Freq: Every day | OPHTHALMIC | Status: DC | PRN

## 2022-04-15 MED ORDER — LIDOCAINE 4 % EX GEL: 1.0000 | Freq: Every day | CUTANEOUS | Status: DC | PRN

## 2022-04-15 MED ORDER — FENTANYL CITRATE PF 50 MCG/ML IJ SOSY: 50.0000 ug | PREFILLED_SYRINGE | Freq: Once | INTRAMUSCULAR | Status: DC

## 2022-04-15 MED ORDER — CEFAZOLIN SODIUM-DEXTROSE 1-4 GM/50ML-% IV SOLN: 1.0000 g | INTRAVENOUS | Status: DC

## 2022-04-15 MED ORDER — ACETAMINOPHEN 10 MG/ML IV SOLN
INTRAVENOUS | Status: DC | PRN
  Administered 2022-04-15: 1000 mg via INTRAVENOUS

## 2022-04-15 MED ORDER — ONDANSETRON HCL 4 MG/2ML IJ SOLN
4.0000 mg | Freq: Once | INTRAMUSCULAR | Status: AC
  Administered 2022-04-15: 4 mg via INTRAVENOUS

## 2022-04-15 MED ORDER — CARAWAY OIL-LEVOMENTHOL 25-20.75 MG PO CAPS: 1.0000 | ORAL_CAPSULE | Freq: Two times a day (BID) | ORAL | Status: DC | PRN

## 2022-04-15 MED ORDER — SEVELAMER CARBONATE 0.8 G PO PACK
0.8000 g | PACK | Freq: Three times a day (TID) | ORAL | Status: DC
  Filled 2022-04-15: qty 1

## 2022-04-15 MED ORDER — INSULIN ASPART 100 UNIT/ML IJ SOLN: 0.0000 [IU] | Freq: Every day | INTRAMUSCULAR | Status: DC

## 2022-04-15 MED ORDER — PROPOFOL 1000 MG/100ML IV EMUL
INTRAVENOUS | Status: AC
  Filled 2022-04-15: qty 100

## 2022-04-15 MED ORDER — 0.9 % SODIUM CHLORIDE (POUR BTL) OPTIME
TOPICAL | Status: DC | PRN
  Administered 2022-04-15: 1000 mL

## 2022-04-15 MED ORDER — BUPIVACAINE HCL (PF) 0.5 % IJ SOLN
INTRAMUSCULAR | Status: AC
  Filled 2022-04-15: qty 30

## 2022-04-15 MED ORDER — CEFAZOLIN SODIUM-DEXTROSE 2-4 GM/100ML-% IV SOLN
2.0000 g | INTRAVENOUS | Status: AC
  Administered 2022-04-15: 2 g via INTRAVENOUS
  Filled 2022-04-15: qty 100

## 2022-04-15 MED ORDER — INSULIN ASPART 100 UNIT/ML IJ SOLN
0.0000 [IU] | Freq: Three times a day (TID) | INTRAMUSCULAR | Status: DC
  Administered 2022-04-16: 3 [IU] via SUBCUTANEOUS
  Administered 2022-04-16: 2 [IU] via SUBCUTANEOUS
  Administered 2022-04-17: 5 [IU] via SUBCUTANEOUS

## 2022-04-15 MED ORDER — SEVELAMER CARBONATE 800 MG PO TABS
800.0000 mg | ORAL_TABLET | Freq: Three times a day (TID) | ORAL | Status: DC
  Administered 2022-04-16 – 2022-04-17 (×5): 800 mg via ORAL
  Filled 2022-04-15 (×7): qty 1

## 2022-04-15 MED ORDER — DEXMEDETOMIDINE (PRECEDEX) IN NS 20 MCG/5ML (4 MCG/ML) IV SYRINGE
PREFILLED_SYRINGE | INTRAVENOUS | Status: DC | PRN
  Administered 2022-04-15: 8 ug via INTRAVENOUS
  Administered 2022-04-15: 4 ug via INTRAVENOUS
  Administered 2022-04-15: 8 ug via INTRAVENOUS

## 2022-04-15 MED ORDER — LEVETIRACETAM 500 MG PO TABS
500.0000 mg | ORAL_TABLET | Freq: Every day | ORAL | Status: DC
  Administered 2022-04-15 – 2022-04-16 (×2): 500 mg via ORAL
  Filled 2022-04-15 (×3): qty 1

## 2022-04-15 MED ORDER — INSULIN GLARGINE-YFGN 100 UNIT/ML ~~LOC~~ SOLN
12.0000 [IU] | Freq: Every day | SUBCUTANEOUS | Status: DC
  Administered 2022-04-15 – 2022-04-16 (×2): 12 [IU] via SUBCUTANEOUS
  Filled 2022-04-15 (×3): qty 0.12

## 2022-04-15 MED ORDER — MELATONIN 5 MG PO TABS
5.0000 mg | ORAL_TABLET | Freq: Every day | ORAL | Status: DC
  Administered 2022-04-15 – 2022-04-16 (×2): 5 mg via ORAL
  Filled 2022-04-15 (×3): qty 1

## 2022-04-15 MED ORDER — ACETAMINOPHEN 10 MG/ML IV SOLN
INTRAVENOUS | Status: AC
  Filled 2022-04-15: qty 100

## 2022-04-15 MED ORDER — LOSARTAN POTASSIUM 50 MG PO TABS
50.0000 mg | ORAL_TABLET | Freq: Every day | ORAL | Status: DC
  Administered 2022-04-15 – 2022-04-17 (×3): 50 mg via ORAL
  Filled 2022-04-15 (×3): qty 1

## 2022-04-15 MED ORDER — PROPOFOL 10 MG/ML IV BOLUS
INTRAVENOUS | Status: AC
Start: 2022-04-15 — End: ?
  Filled 2022-04-15: qty 20

## 2022-04-15 MED ORDER — ALBUTEROL SULFATE (2.5 MG/3ML) 0.083% IN NEBU: 2.5000 mg | INHALATION_SOLUTION | RESPIRATORY_TRACT | Status: DC | PRN

## 2022-04-15 MED ORDER — ORAL CARE MOUTH RINSE: 15.0000 mL | Freq: Once | OROMUCOSAL | Status: AC

## 2022-04-15 MED ORDER — DEXAMETHASONE SODIUM PHOSPHATE 10 MG/ML IJ SOLN
INTRAMUSCULAR | Status: AC
  Filled 2022-04-15: qty 1

## 2022-04-15 SURGICAL SUPPLY — 46 items
BAG COUNTER SPONGE SURGICOUNT (BAG) ×1 IMPLANT
BAG SPNG CNTER NS LX DISP (BAG) ×1
BLADE AVERAGE 25X9 (BLADE) ×1 IMPLANT
BLADE SURG 15 STRL LF DISP TIS (BLADE) IMPLANT
BLADE SURG 15 STRL SS (BLADE) ×4
BNDG CMPR MED 10X6 ELC LF (GAUZE/BANDAGES/DRESSINGS) ×1
BNDG CONFORM 4 STRL LF (GAUZE/BANDAGES/DRESSINGS) ×3 IMPLANT
BNDG ELASTIC 4X5.8 VLCR STR LF (GAUZE/BANDAGES/DRESSINGS) IMPLANT
BNDG ELASTIC 6X10 VLCR STRL LF (GAUZE/BANDAGES/DRESSINGS) ×1 IMPLANT
BNDG ELASTIC 6X5.8 VLCR STR LF (GAUZE/BANDAGES/DRESSINGS) ×3 IMPLANT
CLEANER TIP ELECTROSURG 2X2 (MISCELLANEOUS) ×1 IMPLANT
CNTNR URN SCR LID CUP LEK RST (MISCELLANEOUS) IMPLANT
CONT SPEC 4OZ STRL OR WHT (MISCELLANEOUS)
COVER SURGICAL LIGHT HANDLE (MISCELLANEOUS) ×3 IMPLANT
CUFF TOURN SGL QUICK 24 (TOURNIQUET CUFF)
CUFF TRNQT CYL 24X4X16.5-23 (TOURNIQUET CUFF) IMPLANT
GAUZE SPONGE 4X4 12PLY STRL (GAUZE/BANDAGES/DRESSINGS) ×3 IMPLANT
GAUZE XEROFORM 1X8 LF (GAUZE/BANDAGES/DRESSINGS) ×3 IMPLANT
GAUZE XEROFORM 5X9 LF (GAUZE/BANDAGES/DRESSINGS) ×1 IMPLANT
GLOVE BIO SURGEON STRL SZ7.5 (GLOVE) ×3 IMPLANT
GLOVE BIOGEL PI IND STRL 7.5 (GLOVE) ×2 IMPLANT
GLOVE BIOGEL PI IND STRL 8 (GLOVE) ×2 IMPLANT
GLOVE BIOGEL PI INDICATOR 7.5 (GLOVE) ×1
GLOVE BIOGEL PI INDICATOR 8 (GLOVE) ×1
GLOVE ECLIPSE 8.0 STRL XLNG CF (GLOVE) ×3 IMPLANT
GLOVE SURG ENC TEXT LTX SZ7 (GLOVE) ×3 IMPLANT
GOWN STRL REUS W/ TWL XL LVL3 (GOWN DISPOSABLE) ×2 IMPLANT
GOWN STRL REUS W/TWL XL LVL3 (GOWN DISPOSABLE) ×2
KIT BASIN OR (CUSTOM PROCEDURE TRAY) ×3 IMPLANT
NDL HYPO 25X1 1.5 SAFETY (NEEDLE) ×2 IMPLANT
NEEDLE HYPO 25X1 1.5 SAFETY (NEEDLE) ×2 IMPLANT
PACK ORTHO EXTREMITY (CUSTOM PROCEDURE TRAY) ×3 IMPLANT
PADDING UNDERCAST 2 STRL (CAST SUPPLIES) ×1
PADDING UNDERCAST 2X4 STRL (CAST SUPPLIES) ×2 IMPLANT
PENCIL SMOKE EVACUATOR (MISCELLANEOUS) ×3 IMPLANT
SPIKE FLUID TRANSFER (MISCELLANEOUS) IMPLANT
STAPLER VISISTAT 35W (STAPLE) ×3 IMPLANT
SUT ETHILON 3 0 PS 1 (SUTURE) ×2 IMPLANT
SUT ETHILON 4 0 PS 2 18 (SUTURE) ×3 IMPLANT
SUT MNCRL AB 3-0 PS2 18 (SUTURE) ×1 IMPLANT
SUT MNCRL AB 4-0 PS2 18 (SUTURE) ×1 IMPLANT
SUT VIC AB 4-0 PS2 27 (SUTURE) ×3 IMPLANT
SYR 20ML LL LF (SYRINGE) IMPLANT
TOWEL OR 17X26 10 PK STRL BLUE (TOWEL DISPOSABLE) ×3 IMPLANT
TOWEL OR NON WOVEN STRL DISP B (DISPOSABLE) ×3 IMPLANT
UNDERPAD 30X36 HEAVY ABSORB (UNDERPADS AND DIAPERS) ×6 IMPLANT

## 2022-04-15 NOTE — Anesthesia Preprocedure Evaluation (Addendum)
Anesthesia Evaluation  Patient identified by MRN, date of birth, ID band Patient awake    Reviewed: Allergy & Precautions, NPO status , Patient's Chart, lab work & pertinent test results  History of Anesthesia Complications Negative for: history of anesthetic complications  Airway Mallampati: II  TM Distance: >3 FB Neck ROM: Full    Dental no notable dental hx.    Pulmonary asthma ,    Pulmonary exam normal        Cardiovascular hypertension, Pt. on medications + CAD and +CHF  Normal cardiovascular exam     Neuro/Psych CVA negative psych ROS   GI/Hepatic Neg liver ROS, GERD  ,  Endo/Other  diabetes, Type 2, Insulin Dependent  Renal/GU Dialysis and ESRFRenal disease  negative genitourinary   Musculoskeletal Osteomyelitis right foot   Abdominal   Peds  Hematology  (+) Blood dyscrasia (Hgb 9.3), anemia ,   Anesthesia Other Findings Day of surgery medications reviewed with patient.  Reproductive/Obstetrics negative OB ROS                            Anesthesia Physical Anesthesia Plan  ASA: 3  Anesthesia Plan: MAC   Post-op Pain Management: Tylenol PO (pre-op)*   Induction:   PONV Risk Score and Plan: 2 and Propofol infusion, Treatment may vary due to age or medical condition and Ondansetron  Airway Management Planned: Natural Airway and Simple Face Mask  Additional Equipment: None  Intra-op Plan:   Post-operative Plan:   Informed Consent: I have reviewed the patients History and Physical, chart, labs and discussed the procedure including the risks, benefits and alternatives for the proposed anesthesia with the patient or authorized representative who has indicated his/her understanding and acceptance.     Dental advisory given  Plan Discussed with: CRNA  Anesthesia Plan Comments: (Consent reviewed with patient and her husband in preop room. Stephannie Peters, MD)        Anesthesia Quick Evaluation

## 2022-04-16 ENCOUNTER — Encounter (HOSPITAL_COMMUNITY): Payer: Self-pay | Admitting: Podiatry

## 2022-04-16 ENCOUNTER — Other Ambulatory Visit: Payer: Self-pay

## 2022-04-16 DIAGNOSIS — I96 Gangrene, not elsewhere classified: Secondary | ICD-10-CM | POA: Diagnosis not present

## 2022-04-16 DIAGNOSIS — Z8673 Personal history of transient ischemic attack (TIA), and cerebral infarction without residual deficits: Secondary | ICD-10-CM | POA: Diagnosis not present

## 2022-04-16 DIAGNOSIS — Z992 Dependence on renal dialysis: Secondary | ICD-10-CM | POA: Diagnosis not present

## 2022-04-16 DIAGNOSIS — J45909 Unspecified asthma, uncomplicated: Secondary | ICD-10-CM | POA: Diagnosis not present

## 2022-04-16 DIAGNOSIS — E669 Obesity, unspecified: Secondary | ICD-10-CM | POA: Diagnosis not present

## 2022-04-16 DIAGNOSIS — N186 End stage renal disease: Secondary | ICD-10-CM | POA: Diagnosis not present

## 2022-04-16 DIAGNOSIS — E1122 Type 2 diabetes mellitus with diabetic chronic kidney disease: Secondary | ICD-10-CM | POA: Diagnosis not present

## 2022-04-16 DIAGNOSIS — I5032 Chronic diastolic (congestive) heart failure: Secondary | ICD-10-CM | POA: Diagnosis not present

## 2022-04-16 DIAGNOSIS — E1152 Type 2 diabetes mellitus with diabetic peripheral angiopathy with gangrene: Secondary | ICD-10-CM | POA: Diagnosis not present

## 2022-04-16 DIAGNOSIS — E1165 Type 2 diabetes mellitus with hyperglycemia: Secondary | ICD-10-CM

## 2022-04-16 DIAGNOSIS — M86171 Other acute osteomyelitis, right ankle and foot: Secondary | ICD-10-CM | POA: Diagnosis not present

## 2022-04-16 DIAGNOSIS — I132 Hypertensive heart and chronic kidney disease with heart failure and with stage 5 chronic kidney disease, or end stage renal disease: Secondary | ICD-10-CM | POA: Diagnosis not present

## 2022-04-16 DIAGNOSIS — D631 Anemia in chronic kidney disease: Secondary | ICD-10-CM | POA: Diagnosis not present

## 2022-04-16 DIAGNOSIS — Z794 Long term (current) use of insulin: Secondary | ICD-10-CM | POA: Diagnosis not present

## 2022-04-16 DIAGNOSIS — N185 Chronic kidney disease, stage 5: Secondary | ICD-10-CM | POA: Diagnosis not present

## 2022-04-16 LAB — GLUCOSE, CAPILLARY
Glucose-Capillary: 152 mg/dL — ABNORMAL HIGH (ref 70–99)
Glucose-Capillary: 173 mg/dL — ABNORMAL HIGH (ref 70–99)
Glucose-Capillary: 147 mg/dL — ABNORMAL HIGH (ref 70–99)
Glucose-Capillary: 104 mg/dL — ABNORMAL HIGH (ref 70–99)
Glucose-Capillary: 192 mg/dL — ABNORMAL HIGH (ref 70–99)

## 2022-04-16 LAB — CBC
HCT: 25.8 % — ABNORMAL LOW (ref 36.0–46.0)
Hemoglobin: 8.6 g/dL — ABNORMAL LOW (ref 12.0–15.0)
MCH: 30.4 pg (ref 26.0–34.0)
MCHC: 33.3 g/dL (ref 30.0–36.0)
MCV: 91.2 fL (ref 80.0–100.0)
Platelets: 295 10*3/uL (ref 150–400)
RBC: 2.83 MIL/uL — ABNORMAL LOW (ref 3.87–5.11)
RDW: 17.2 % — ABNORMAL HIGH (ref 11.5–15.5)
WBC: 10.3 10*3/uL (ref 4.0–10.5)
nRBC: 0 % (ref 0.0–0.2)

## 2022-04-16 MED ORDER — DIPHENHYDRAMINE HCL 25 MG PO CAPS
25.0000 mg | ORAL_CAPSULE | Freq: Four times a day (QID) | ORAL | Status: DC | PRN
  Administered 2022-04-16: 25 mg via ORAL
  Filled 2022-04-16: qty 1

## 2022-04-16 MED ORDER — DIPHENHYDRAMINE-ZINC ACETATE 2-0.1 % EX CREA
TOPICAL_CREAM | Freq: Three times a day (TID) | CUTANEOUS | Status: DC | PRN
Start: 2022-04-16 — End: 2022-04-18
  Administered 2022-04-17: 1 via TOPICAL
  Filled 2022-04-16: qty 28

## 2022-04-16 MED ORDER — HYDROMORPHONE HCL 1 MG/ML IJ SOLN
0.5000 mg | INTRAMUSCULAR | Status: DC | PRN
  Administered 2022-04-16: 0.5 mg via INTRAVENOUS
  Filled 2022-04-16: qty 0.5

## 2022-04-16 MED ORDER — CEFADROXIL 500 MG PO CAPS
1000.0000 mg | ORAL_CAPSULE | ORAL | Status: DC
Start: 2022-04-18 — End: 2022-04-18

## 2022-04-16 MED ORDER — HYDROMORPHONE HCL 1 MG/ML IJ SOLN
1.0000 mg | INTRAMUSCULAR | Status: DC | PRN
  Filled 2022-04-16: qty 1

## 2022-04-16 MED ORDER — CEFADROXIL 500 MG PO CAPS: 1000.0000 mg | ORAL_CAPSULE | Freq: Two times a day (BID) | ORAL | Status: DC

## 2022-04-16 MED ORDER — DIPHENHYDRAMINE HCL 25 MG PO CAPS: 25.0000 mg | ORAL_CAPSULE | Freq: Every evening | ORAL | Status: DC | PRN

## 2022-04-16 MED ORDER — HYDRALAZINE HCL 20 MG/ML IJ SOLN
10.0000 mg | Freq: Four times a day (QID) | INTRAMUSCULAR | Status: DC | PRN
  Administered 2022-04-16 – 2022-04-17 (×2): 10 mg via INTRAVENOUS
  Filled 2022-04-16 (×3): qty 1

## 2022-04-16 MED ORDER — OXYCODONE HCL 5 MG PO TABS
5.0000 mg | ORAL_TABLET | ORAL | Status: DC | PRN
  Administered 2022-04-16 – 2022-04-17 (×3): 5 mg via ORAL
  Filled 2022-04-16 (×4): qty 1

## 2022-04-16 MED ORDER — CEFADROXIL 500 MG PO CAPS
1000.0000 mg | ORAL_CAPSULE | ORAL | Status: DC
  Administered 2022-04-16: 1000 mg via ORAL
  Filled 2022-04-16: qty 2

## 2022-04-16 MED ORDER — MORPHINE SULFATE (PF) 2 MG/ML IV SOLN: 2.0000 mg | INTRAVENOUS | Status: DC | PRN

## 2022-04-16 NOTE — Progress Notes (Signed)
PROGRESS NOTE    Carolyn Russo  YQM:[id number] DOB: 04-Aug-1951 DOA: 04/15/2022 PCP: Dorothyann Peng, MD    Brief Narrative:  71 year old female with a history of hypertension, diabetes, peripheral arterial disease, end-stage renal disease on dialysis, admitted to the hospital for elective right great toe amputation.  Postoperatively, she was monitored in the hospital.  Main barrier to discharge at this time is uncontrolled pain.  Anticipate discharge home in the next 24 hours if pain is reasonably controlled   Assessment & Plan:   Principal Problem:   Gangrene of toe (HCC) Active Problems:   Obesity (BMI 30-39.9)   Anemia in chronic kidney disease   Hyperglycemia due to type 2 diabetes mellitus (HCC)   Chronic diastolic CHF (congestive heart failure) (HCC)   ESRD on dialysis (HCC)   Dry gangrene of right great toe -Status post amputation -She has been receiving Ancef, will transition to cefadroxil for another week. -We will leave dressing in place -Podiatry to follow-up as already been scheduled for later this week -PT eval with recommendations for home health PT -[facility]  Pain management -Appears to be main barrier to discharge -Increased frequency of oxycodone -Have ordered IV narcotics as needed for severe pain -Monitor overnight and if pain is manageable with oral medications, anticipate discharge home in a.m.   End-stage renal disease on hemodialysis -Last dialysis was this morning -Currently, she does not need further dialysis and can present to her outpatient dialysis center on 6/26 as previously scheduled   Chronic diastolic congestive heart failure -Volume management with dialysis -Currently appears to be euvolemic   Anemia in chronic kidney disease -Hemoglobin is near baseline -Continue to monitor   Peripheral arterial disease -Followed by vascular surgery -Continue Plavix and statin   Hypertension -Continue home dose of Coreg and losartan -Blood pressure  elevated likely in the setting of acute pain   Hyperlipidemia -Continue statin   Type 2 diabetes, insulin-dependent, uncontrolled with hyperglycemia -Continue on basal insulin dose nightly -Continue on sliding scale insulin     DVT prophylaxis: Place and maintain sequential compression device Start: 04/15/22 1916  Code Status: Full code Family Communication: Discussed with her husband, Dr. Harlon Flor at the bedside Disposition Plan: Status is: Observation The patient remains OBS appropriate and will d/c before 2 midnights.     Consultants:  Podiatry  Procedures:  Right great toe amputation  Antimicrobials:  Ancef 6/23 >   Subjective: Reports that she has had difficult to manage pain through the day.  Pain got worse after postop shoe was applied and she worked with physical therapy.  She is concerned about going home with current pain level.  Objective: Vitals:   04/16/22 1025 04/16/22 1123 04/16/22 1449 04/16/22 1551  BP: (!) 164/70 (!) 181/74 (!) 186/68 (!) 162/71  Pulse: 77 70 68 66  Resp: 18 14 14    Temp: 98.7 F (37.1 C) 98.7 F (37.1 C) 97.7 F (36.5 C)   TempSrc:  Oral Oral   SpO2: 96% 96% 97%   Weight:      Height:        Intake/Output Summary (Last 24 hours) at 04/16/2022 1756 Last data filed at 04/16/2022 1300 Gross per 24 hour  Intake 560 ml  Output 300 ml  Net 260 ml   Filed Weights   04/12/22 1512 04/15/22 1957  Weight: 81.6 kg 81.7 kg    Examination:  General exam: Appears calm and comfortable  Respiratory system: Clear to auscultation. Respiratory effort normal. Cardiovascular system: S1 &  S2 heard, RRR. No JVD, murmurs, rubs, gallops or clicks. No pedal edema. Gastrointestinal system: Abdomen is nondistended, soft and nontender. No organomegaly or masses felt. Normal bowel sounds heard. Central nervous system: Alert and oriented. No focal neurological deficits. Extremities: right foot wrapped in dressing Skin: No rashes, lesions or  ulcers Psychiatry: Judgement and insight appear normal. Mood & affect appropriate.     Data Reviewed: I have personally reviewed following labs and imaging studies  CBC: Recent Labs  Lab 04/13/22 1512 04/15/22 1432 04/16/22 0324  WBC 8.9 11.3* 10.3  NEUTROABS 4.9  --   --   HGB 9.3* 9.0* 8.6*  HCT 27.6* 27.4* 25.8*  MCV 89 91.0 91.2  PLT 341 290 295   Basic Metabolic Panel: Recent Labs  Lab 04/13/22 1512 04/15/22 1432  NA 142 136  K 4.1 3.8  CL 96 98  CO2 31* 29  GLUCOSE 173* 151*  BUN 12 13  CREATININE 3.92* 3.79*  CALCIUM 8.4* 8.3*   GFR: Estimated Creatinine Clearance: 13.3 mL/min (A) (by C-G formula based on SCr of 3.79 mg/dL (H)). Liver Function Tests: Recent Labs  Lab 04/13/22 1512  AST 16  ALT 18  ALKPHOS 88  BILITOT 0.3  PROT 7.1  ALBUMIN 3.9   No results for input(s): "LIPASE", "AMYLASE" in the last 168 hours. No results for input(s): "AMMONIA" in the last 168 hours. Coagulation Profile: Recent Labs  Lab 04/13/22 1512  INR 1.1   Cardiac Enzymes: No results for input(s): "CKTOTAL", "CKMB", "CKMBINDEX", "TROPONINI" in the last 168 hours. BNP (last 3 results) No results for input(s): "PROBNP" in the last 8760 hours. HbA1C: Recent Labs    04/15/22 1432  HGBA1C 5.9*   CBG: Recent Labs  Lab 04/15/22 2113 04/16/22 0635 04/16/22 0707 04/16/22 1242 04/16/22 1628  GLUCAP 185* 152* 173* 147* 104*   Lipid Profile: No results for input(s): "CHOL", "HDL", "LDLCALC", "TRIG", "CHOLHDL", "LDLDIRECT" in the last 72 hours. Thyroid Function Tests: No results for input(s): "TSH", "T4TOTAL", "FREET4", "T3FREE", "THYROIDAB" in the last 72 hours. Anemia Panel: No results for input(s): "VITAMINB12", "FOLATE", "FERRITIN", "TIBC", "IRON", "RETICCTPCT" in the last 72 hours. Sepsis Labs: No results for input(s): "PROCALCITON", "LATICACIDVEN" in the last 168 hours.  No results found for this or any previous visit (from the past 240 hour(s)).        Radiology Studies: No results found.      Scheduled Meds:  (feeding supplement) PROSource Plus  30 mL Oral BID BM   carvedilol  3.125 mg Oral BID WC   cefadroxil  1,000 mg Oral BID   clopidogrel  75 mg Oral Daily   insulin aspart  0-15 Units Subcutaneous TID WC   insulin aspart  0-5 Units Subcutaneous QHS   insulin glargine-yfgn  12 Units Subcutaneous QHS   levETIRAcetam  500 mg Oral QHS   losartan  50 mg Oral Daily   melatonin  5 mg Oral QHS   mometasone-formoterol  2 puff Inhalation BID   pantoprazole  40 mg Oral Daily   rosuvastatin  20 mg Oral Daily   sevelamer carbonate  800 mg Oral TID WC   Continuous Infusions:   LOS: 1 day    Time spent:    Erick Blinks, MD [facility]   If 7PM-7AM, please contact night-coverage [email or web address]  04/16/2022, 5:56 PM

## 2022-04-16 NOTE — Plan of Care (Signed)
Plan of care initiated and discussed. 

## 2022-04-17 ENCOUNTER — Observation Stay (HOSPITAL_COMMUNITY): Payer: Medicare PPO

## 2022-04-17 DIAGNOSIS — R1111 Vomiting without nausea: Secondary | ICD-10-CM | POA: Diagnosis not present

## 2022-04-17 DIAGNOSIS — Z794 Long term (current) use of insulin: Secondary | ICD-10-CM | POA: Diagnosis not present

## 2022-04-17 DIAGNOSIS — N185 Chronic kidney disease, stage 5: Secondary | ICD-10-CM | POA: Diagnosis not present

## 2022-04-17 DIAGNOSIS — K449 Diaphragmatic hernia without obstruction or gangrene: Secondary | ICD-10-CM | POA: Diagnosis not present

## 2022-04-17 DIAGNOSIS — N186 End stage renal disease: Secondary | ICD-10-CM | POA: Diagnosis not present

## 2022-04-17 DIAGNOSIS — E1122 Type 2 diabetes mellitus with diabetic chronic kidney disease: Secondary | ICD-10-CM | POA: Diagnosis not present

## 2022-04-17 DIAGNOSIS — I96 Gangrene, not elsewhere classified: Secondary | ICD-10-CM | POA: Diagnosis not present

## 2022-04-17 DIAGNOSIS — Z992 Dependence on renal dialysis: Secondary | ICD-10-CM | POA: Diagnosis not present

## 2022-04-17 DIAGNOSIS — K439 Ventral hernia without obstruction or gangrene: Secondary | ICD-10-CM | POA: Diagnosis not present

## 2022-04-17 DIAGNOSIS — I5032 Chronic diastolic (congestive) heart failure: Secondary | ICD-10-CM | POA: Diagnosis not present

## 2022-04-17 DIAGNOSIS — E1152 Type 2 diabetes mellitus with diabetic peripheral angiopathy with gangrene: Secondary | ICD-10-CM | POA: Diagnosis not present

## 2022-04-17 DIAGNOSIS — D631 Anemia in chronic kidney disease: Secondary | ICD-10-CM | POA: Diagnosis not present

## 2022-04-17 DIAGNOSIS — J45909 Unspecified asthma, uncomplicated: Secondary | ICD-10-CM | POA: Diagnosis not present

## 2022-04-17 DIAGNOSIS — D259 Leiomyoma of uterus, unspecified: Secondary | ICD-10-CM | POA: Diagnosis not present

## 2022-04-17 DIAGNOSIS — I132 Hypertensive heart and chronic kidney disease with heart failure and with stage 5 chronic kidney disease, or end stage renal disease: Secondary | ICD-10-CM | POA: Diagnosis not present

## 2022-04-17 DIAGNOSIS — M86171 Other acute osteomyelitis, right ankle and foot: Secondary | ICD-10-CM | POA: Diagnosis not present

## 2022-04-17 DIAGNOSIS — E669 Obesity, unspecified: Secondary | ICD-10-CM | POA: Diagnosis not present

## 2022-04-17 DIAGNOSIS — Z8673 Personal history of transient ischemic attack (TIA), and cerebral infarction without residual deficits: Secondary | ICD-10-CM | POA: Diagnosis not present

## 2022-04-17 DIAGNOSIS — E1165 Type 2 diabetes mellitus with hyperglycemia: Secondary | ICD-10-CM | POA: Diagnosis not present

## 2022-04-17 LAB — COMPREHENSIVE METABOLIC PANEL
ALT: 6 U/L (ref 0–44)
AST: 14 U/L — ABNORMAL LOW (ref 15–41)
Albumin: 3.1 g/dL — ABNORMAL LOW (ref 3.5–5.0)
Alkaline Phosphatase: 56 U/L (ref 38–126)
Anion gap: 11 (ref 5–15)
BUN: 38 mg/dL — ABNORMAL HIGH (ref 8–23)
CO2: 23 mmol/L (ref 22–32)
Calcium: 8.8 mg/dL — ABNORMAL LOW (ref 8.9–10.3)
Chloride: 100 mmol/L (ref 98–111)
Creatinine, Ser: 6.98 mg/dL — ABNORMAL HIGH (ref 0.44–1.00)
GFR, Estimated: 6 mL/min — ABNORMAL LOW (ref >60)
Glucose, Bld: 196 mg/dL — ABNORMAL HIGH (ref 70–99)
Potassium: 4.4 mmol/L (ref 3.5–5.1)
Sodium: 134 mmol/L — ABNORMAL LOW (ref 135–145)
Total Bilirubin: 0.6 mg/dL (ref 0.3–1.2)
Total Protein: 7 g/dL (ref 6.5–8.1)

## 2022-04-17 LAB — CBC
HCT: 26.4 % — ABNORMAL LOW (ref 36.0–46.0)
Hemoglobin: 8.9 g/dL — ABNORMAL LOW (ref 12.0–15.0)
MCH: 30.2 pg (ref 26.0–34.0)
MCHC: 33.7 g/dL (ref 30.0–36.0)
MCV: 89.5 fL (ref 80.0–100.0)
Platelets: 315 10*3/uL (ref 150–400)
RBC: 2.95 MIL/uL — ABNORMAL LOW (ref 3.87–5.11)
RDW: 17.3 % — ABNORMAL HIGH (ref 11.5–15.5)
WBC: 14.9 10*3/uL — ABNORMAL HIGH (ref 4.0–10.5)
nRBC: 0 % (ref 0.0–0.2)

## 2022-04-17 LAB — GLUCOSE, CAPILLARY
Glucose-Capillary: 145 mg/dL — ABNORMAL HIGH (ref 70–99)
Glucose-Capillary: 206 mg/dL — ABNORMAL HIGH (ref 70–99)
Glucose-Capillary: 89 mg/dL (ref 70–99)

## 2022-04-17 LAB — MAGNESIUM: Magnesium: 2 mg/dL (ref 1.7–2.4)

## 2022-04-17 LAB — LIPASE, BLOOD: Lipase: 25 U/L (ref 11–51)

## 2022-04-17 MED ORDER — BARIUM SULFATE 2 % PO SUSP
450.0000 mL | Freq: Two times a day (BID) | ORAL | Status: AC
Start: 2022-04-17 — End: 2022-04-17
  Administered 2022-04-17: 450 mL via ORAL

## 2022-04-17 MED ORDER — ONDANSETRON HCL 4 MG/2ML IJ SOLN
4.0000 mg | Freq: Four times a day (QID) | INTRAMUSCULAR | Status: DC | PRN
  Administered 2022-04-17: 4 mg via INTRAVENOUS
  Filled 2022-04-17 (×2): qty 2

## 2022-04-17 MED ORDER — CEFADROXIL 500 MG PO CAPS: 1000.0000 mg | ORAL_CAPSULE | Freq: Every day | ORAL | 0 refills | Status: AC

## 2022-04-17 MED ORDER — PROCHLORPERAZINE EDISYLATE 10 MG/2ML IJ SOLN
10.0000 mg | Freq: Once | INTRAMUSCULAR | Status: AC | PRN
  Administered 2022-04-17: 10 mg via INTRAVENOUS
  Filled 2022-04-17: qty 2

## 2022-04-17 MED ORDER — OXYCODONE HCL 5 MG PO TABS: 5.0000 mg | ORAL_TABLET | ORAL | 0 refills | Status: AC | PRN

## 2022-04-17 MED ORDER — BISACODYL 10 MG RE SUPP
10.0000 mg | Freq: Once | RECTAL | Status: AC
  Administered 2022-04-17: 10 mg via RECTAL
  Filled 2022-04-17: qty 1

## 2022-04-17 MED ORDER — MAGNESIUM HYDROXIDE 400 MG/5ML PO SUSP
960.0000 mL | Freq: Once | ORAL | Status: DC
  Filled 2022-04-17: qty 473

## 2022-04-17 MED ORDER — CHLORHEXIDINE GLUCONATE CLOTH 2 % EX PADS
6.0000 | MEDICATED_PAD | Freq: Every day | CUTANEOUS | Status: DC
  Administered 2022-04-17: 6 via TOPICAL

## 2022-04-17 MED ORDER — HYDRALAZINE HCL 25 MG PO TABS
25.0000 mg | ORAL_TABLET | Freq: Three times a day (TID) | ORAL | Status: DC
  Administered 2022-04-17 (×2): 25 mg via ORAL
  Filled 2022-04-17 (×2): qty 1

## 2022-04-17 MED ORDER — ROSUVASTATIN CALCIUM 10 MG PO TABS: 10.0000 mg | ORAL_TABLET | Freq: Every day | ORAL | Status: DC

## 2022-04-17 NOTE — TOC Transition Note (Signed)
Transition of Care [facility]) - CM/SW Discharge Note   Patient Details  Name: Carolyn Russo MRN: [id number] Date of Birth: [birth year]  [facility] CM/SW Contact:  Darleene Cleaver, LCSW Phone Number: 04/17/2022, 6:05 PM   Clinical Narrative:     CSW was informed that patient needs home health set up and a 3 in 1 ordered.  CSW spoke to patient's husband Channing Mutters and asked if they had a preference, patient was previously open to [facility], however patient and husband do not want them again.  CSW was able to contact [facility] and spoke to Vangie Bicker, they can accept patient for [facility] PT and RN.  CSW updated attending physician, bedside nurse, and patient's husband.  CSW spoke to [place] from [facility], she can make arrangements for the 3 in 1 to be delivered to patient's home.  Per patient's husband they are fine with it being delivered to the home.  CSW signing off, please reconsult if other social work needs arise.     Final next level of care: Home w Home Health Services Barriers to Discharge: Barriers Resolved   Patient Goals and CMS Choice Patient states their goals for this hospitalization and ongoing recovery are:: To return back home with home health. CMS Medicare.gov Compare Post Acute Care list provided to:: Patient Represenative (must comment) Choice offered to / list presented to : Spouse  Discharge Placement                       Discharge Plan and Services                DME Arranged: 3-N-1 DME Agency: [facility] Date DME Agency Contacted: 04/17/22 Time DME Agency Contacted: [phone] Representative spoke with at DME Agency: Duwayne Heck HH Arranged: PT, RN HH Agency: [facility] Date [facility] Agency Contacted: 04/17/22 Time HH Agency Contacted: 1805 Representative spoke with at [facility] Agency: Vangie Bicker  Social Determinants of Health (SDOH) Interventions     Readmission Risk Interventions     No data to display

## 2022-04-18 ENCOUNTER — Telehealth: Payer: Self-pay

## 2022-04-18 ENCOUNTER — Ambulatory Visit: Payer: Medicare PPO | Admitting: Physical Medicine and Rehabilitation

## 2022-04-18 NOTE — Telephone Encounter (Signed)
error 

## 2022-04-18 NOTE — Telephone Encounter (Signed)
Transition Care Management Unsuccessful Follow-up Telephone Call  Date of discharge and from where:  04/17/2022 [place]   Attempts:  2nd Attempt  Reason for unsuccessful TCM follow-up call:  Left voice message

## 2022-04-19 ENCOUNTER — Telehealth: Payer: Self-pay

## 2022-04-19 DIAGNOSIS — Z992 Dependence on renal dialysis: Secondary | ICD-10-CM | POA: Diagnosis not present

## 2022-04-19 DIAGNOSIS — N186 End stage renal disease: Secondary | ICD-10-CM | POA: Diagnosis not present

## 2022-04-19 DIAGNOSIS — N2581 Secondary hyperparathyroidism of renal origin: Secondary | ICD-10-CM | POA: Diagnosis not present

## 2022-04-19 LAB — SURGICAL PATHOLOGY

## 2022-04-21 ENCOUNTER — Telehealth: Payer: Self-pay

## 2022-04-21 ENCOUNTER — Ambulatory Visit (INDEPENDENT_AMBULATORY_CARE_PROVIDER_SITE_OTHER): Payer: Medicare PPO | Admitting: Podiatry

## 2022-04-21 DIAGNOSIS — I96 Gangrene, not elsewhere classified: Secondary | ICD-10-CM

## 2022-04-21 NOTE — Patient Instructions (Signed)
Apply betadine paint to the fifth toe and sutures and staples, change gauze every other day

## 2022-04-21 NOTE — Telephone Encounter (Signed)
Spoke with Dr Lanell Matar. He reports Carolyn Russo has had trouble recently since her toe was removed making it to appointments. He was given the option to transition to remote health, he agreed.

## 2022-04-22 ENCOUNTER — Telehealth: Payer: Self-pay

## 2022-04-22 DIAGNOSIS — Z992 Dependence on renal dialysis: Secondary | ICD-10-CM | POA: Diagnosis not present

## 2022-04-22 DIAGNOSIS — N2581 Secondary hyperparathyroidism of renal origin: Secondary | ICD-10-CM | POA: Diagnosis not present

## 2022-04-22 DIAGNOSIS — N186 End stage renal disease: Secondary | ICD-10-CM | POA: Diagnosis not present

## 2022-04-22 DIAGNOSIS — E1122 Type 2 diabetes mellitus with diabetic chronic kidney disease: Secondary | ICD-10-CM | POA: Diagnosis not present

## 2022-04-22 NOTE — Telephone Encounter (Signed)
Patient called to see if she had tired any other inhalers if so is she willing to switch. Patient didn't answer and her mailbox was full.

## 2022-04-24 NOTE — Progress Notes (Signed)
  Subjective:  Patient ID: Carolyn Russo, female    DOB: 1951/05/08,  MRN: [id number]  Chief Complaint  Patient presents with   Routine Post Op    POV #1 DOS 04/15/2022 AMPUTATION RT GREAT TOE & BONE BEHIND IT     71 y.o. female returns for post-op check.  Still having pain that improved briefly the day after surgery but has resumed  Review of Systems: Negative except as noted in the HPI. Denies N/V/F/Ch.   Objective:  There were no vitals filed for this visit. There is no height or weight on file to calculate BMI. Constitutional Well developed. Well nourished.  Vascular Plantar distal flap is cool to touch.  Does not appear to have good perfusion.  Early signs of gangrene on the dorsal flap and fifth toe calf is soft and supple, no posterior calf or knee pain, negative Homans' sign  Neurologic Normal speech. Oriented to person, place, and time. Epicritic sensation to light touch grossly present bilaterally.  Dermatologic Skin flaps appear dysvascular no gross dehiscence sutures intact no purulence or malodor  Orthopedic: Tenderness to palpation noted about the surgical site.    Assessment:   1. Dry gangrene ([place])    Plan:  Patient was evaluated and treated and all questions answered.  Unfortunately in my opinion does not appear to be healing well.  I think she likely is headed for a transmetatarsal or she reports amputation but it is too early to tell.  It appears that there is gangrene developing on the fifth toe now as well.  The flaps of the amputation site appear to be dysvascular.  We again discussed hyperbaric oxygen therapy and a wound care referral has already been placed my office will contact them to see what the status is.  Her husband also brought up the option of grafting or amniotic tissue to help heal the fifth toe.  I discussed with him that I do not think this would be successful and the reason for this is that there is somewhat dysvascular tissue of the graft will  fail if there is not adequate blood flow and needs a healthy granular wound bed to adhere to.  He says he has a connection that is able to obtain amniotic tissue grafting and will let me know how they want to proceed on this. Return in about 6 days (around 04/27/2022) for post op (no x-rays).

## 2022-04-25 DIAGNOSIS — N186 End stage renal disease: Secondary | ICD-10-CM | POA: Diagnosis not present

## 2022-04-25 DIAGNOSIS — Z992 Dependence on renal dialysis: Secondary | ICD-10-CM | POA: Diagnosis not present

## 2022-04-25 DIAGNOSIS — N2581 Secondary hyperparathyroidism of renal origin: Secondary | ICD-10-CM | POA: Diagnosis not present

## 2022-04-27 DIAGNOSIS — N186 End stage renal disease: Secondary | ICD-10-CM | POA: Diagnosis not present

## 2022-04-27 DIAGNOSIS — N2581 Secondary hyperparathyroidism of renal origin: Secondary | ICD-10-CM | POA: Diagnosis not present

## 2022-04-27 DIAGNOSIS — Z992 Dependence on renal dialysis: Secondary | ICD-10-CM | POA: Diagnosis not present

## 2022-04-29 DIAGNOSIS — N186 End stage renal disease: Secondary | ICD-10-CM | POA: Diagnosis not present

## 2022-04-29 DIAGNOSIS — N2581 Secondary hyperparathyroidism of renal origin: Secondary | ICD-10-CM | POA: Diagnosis not present

## 2022-04-29 DIAGNOSIS — Z992 Dependence on renal dialysis: Secondary | ICD-10-CM | POA: Diagnosis not present

## 2022-05-02 DIAGNOSIS — E1165 Type 2 diabetes mellitus with hyperglycemia: Secondary | ICD-10-CM | POA: Diagnosis not present

## 2022-05-02 DIAGNOSIS — N2581 Secondary hyperparathyroidism of renal origin: Secondary | ICD-10-CM | POA: Diagnosis not present

## 2022-05-02 DIAGNOSIS — N186 End stage renal disease: Secondary | ICD-10-CM | POA: Diagnosis not present

## 2022-05-02 DIAGNOSIS — Z992 Dependence on renal dialysis: Secondary | ICD-10-CM | POA: Diagnosis not present

## 2022-05-03 DIAGNOSIS — H9193 Unspecified hearing loss, bilateral: Secondary | ICD-10-CM | POA: Diagnosis not present

## 2022-05-03 DIAGNOSIS — M6281 Muscle weakness (generalized): Secondary | ICD-10-CM | POA: Diagnosis not present

## 2022-05-03 DIAGNOSIS — Z1211 Encounter for screening for malignant neoplasm of colon: Secondary | ICD-10-CM | POA: Diagnosis not present

## 2022-05-03 DIAGNOSIS — K5909 Other constipation: Secondary | ICD-10-CM | POA: Diagnosis not present

## 2022-05-03 DIAGNOSIS — N186 End stage renal disease: Secondary | ICD-10-CM | POA: Diagnosis not present

## 2022-05-03 DIAGNOSIS — I10 Essential (primary) hypertension: Secondary | ICD-10-CM | POA: Diagnosis not present

## 2022-05-03 MED ORDER — CEFADROXIL 500 MG PO CAPS
1000.0000 mg | ORAL_CAPSULE | ORAL | 0 refills | Status: DC
Start: 2022-05-04 — End: 2022-06-24

## 2022-05-03 NOTE — Addendum Note (Signed)
Addended bySherryle Lis, Kindrick Lankford R on: 05/03/2022 12:29 PM   Modules accepted: Orders

## 2022-05-04 ENCOUNTER — Telehealth: Payer: Self-pay

## 2022-05-04 DIAGNOSIS — Z992 Dependence on renal dialysis: Secondary | ICD-10-CM | POA: Diagnosis not present

## 2022-05-04 DIAGNOSIS — N186 End stage renal disease: Secondary | ICD-10-CM | POA: Diagnosis not present

## 2022-05-04 DIAGNOSIS — N2581 Secondary hyperparathyroidism of renal origin: Secondary | ICD-10-CM | POA: Diagnosis not present

## 2022-05-04 NOTE — Chronic Care Management (AMB) (Signed)
    Iysha F Prochnow's caretaker Ulice Bold was reminded to have all medications, supplements and any blood glucose and blood pressure readings available for review with Orlando Penner, Pharm. D, at her telephone visit on 05-06-2022 at 10:00.  [place] Pharmacist Assistant [phone]

## 2022-05-05 ENCOUNTER — Ambulatory Visit (INDEPENDENT_AMBULATORY_CARE_PROVIDER_SITE_OTHER): Payer: Medicare PPO | Admitting: Podiatry

## 2022-05-05 DIAGNOSIS — E1151 Type 2 diabetes mellitus with diabetic peripheral angiopathy without gangrene: Secondary | ICD-10-CM

## 2022-05-05 DIAGNOSIS — I96 Gangrene, not elsewhere classified: Secondary | ICD-10-CM

## 2022-05-05 DIAGNOSIS — I739 Peripheral vascular disease, unspecified: Secondary | ICD-10-CM

## 2022-05-05 NOTE — Patient Instructions (Signed)
Call Phone: [phone] to schedule the hyperbarics, they have called and left a voicemail

## 2022-05-06 ENCOUNTER — Telehealth: Payer: Medicare PPO

## 2022-05-06 DIAGNOSIS — Z992 Dependence on renal dialysis: Secondary | ICD-10-CM | POA: Diagnosis not present

## 2022-05-06 DIAGNOSIS — N186 End stage renal disease: Secondary | ICD-10-CM | POA: Diagnosis not present

## 2022-05-06 DIAGNOSIS — N2581 Secondary hyperparathyroidism of renal origin: Secondary | ICD-10-CM | POA: Diagnosis not present

## 2022-05-07 ENCOUNTER — Encounter: Payer: Self-pay | Admitting: Podiatry

## 2022-05-07 NOTE — Progress Notes (Signed)
  Subjective:  Patient ID: Carolyn Russo, female    DOB: 07-20-1951,  MRN: [id number]  No chief complaint on file.    71 y.o. female returns for post-op check.  Still fairly painful for her.  She is now taking antibiotic that I prescribed for her.  They put the amnion on the top of the foot and outside of the fifth toe where there was drainage  Review of Systems: Negative except as noted in the HPI. Denies N/V/F/Ch.   Objective:  There were no vitals filed for this visit. There is no height or weight on file to calculate BMI. Constitutional Well developed. Well nourished.  Vascular Plantar distal flap is cool to touch.  Does not appear to have good perfusion.  Early signs of gangrene on the dorsal flap and fifth toe calf is soft and supple, no posterior calf or knee pain, negative Homans' sign  Neurologic Normal speech. Oriented to person, place, and time. Epicritic sensation to light touch grossly present bilaterally.  Dermatologic Skin flaps appear dysvascular no gross dehiscence sutures intact no purulence or malodor, zone of discoloration and gangrene is increasing in size and proximal extent  Orthopedic: Tenderness to palpation noted about the surgical site.  Moderate edema           Assessment:   1. Dry gangrene ([place])   2. Peripheral arterial disease ([place])   3. Diabetes mellitus with peripheral angiopathy ([place])    Plan:  Patient was evaluated and treated and all questions answered.  Zone of gangrenous changes and disvascular tissue continues to increase.  Ultimately do not think this will be successful and she will require at least a transmetatarsal amputation.  She is at high risk of limb loss.  I discussed this with the patient and her husband.  I do not think amniotic membrane applications will change the course of her healing as the issue primarily is ischemia and blood flow to the distal tissue, the amnion will die and possibly increase risk of infection with the  amount of necrosis that is developing.  They would still like to pursue hyperbaric therapy.  My office did check with the wound care center on the referral and per the referral note they did try to call a few times and left a voicemail but did not hear back from the patient's family.  I discussed this with them and they will call to follow-up on the referral and can reopen this if needed.  I will see her back in 2 weeks for reevaluation.  Return precautions discussed if it continues to worsen or systemic signs of infection develop.  Sutures and staples were left intact I expect if they removed at this point that there will be significant dehiscence   Return in about 2 weeks (around 05/19/2022) for wound exam .

## 2022-05-09 ENCOUNTER — Encounter: Payer: Self-pay | Admitting: Physician Assistant

## 2022-05-09 ENCOUNTER — Telehealth: Payer: Self-pay | Admitting: Podiatry

## 2022-05-09 DIAGNOSIS — Z992 Dependence on renal dialysis: Secondary | ICD-10-CM | POA: Diagnosis not present

## 2022-05-09 DIAGNOSIS — N186 End stage renal disease: Secondary | ICD-10-CM | POA: Diagnosis not present

## 2022-05-09 DIAGNOSIS — I739 Peripheral vascular disease, unspecified: Secondary | ICD-10-CM

## 2022-05-09 DIAGNOSIS — E1151 Type 2 diabetes mellitus with diabetic peripheral angiopathy without gangrene: Secondary | ICD-10-CM

## 2022-05-09 DIAGNOSIS — M86671 Other chronic osteomyelitis, right ankle and foot: Secondary | ICD-10-CM

## 2022-05-09 DIAGNOSIS — I96 Gangrene, not elsewhere classified: Secondary | ICD-10-CM

## 2022-05-09 DIAGNOSIS — N2581 Secondary hyperparathyroidism of renal origin: Secondary | ICD-10-CM | POA: Diagnosis not present

## 2022-05-09 MED ORDER — OXYCODONE HCL 5 MG PO TABS: 5.0000 mg | ORAL_TABLET | ORAL | 0 refills | Status: DC | PRN

## 2022-05-09 NOTE — Telephone Encounter (Signed)
Pts husband, Dr [name], states that he called the [place] office but they do not have record of this patient. The initial order was entered on 04/15/22 however was unable to contact pt to get her scheduled. The order will need to be resubmitted.   Please advise.

## 2022-05-09 NOTE — Addendum Note (Signed)
Addended bySherryle Lis, Ruhani Umland R on: 05/09/2022 02:08 PM   Modules accepted: Orders

## 2022-05-09 NOTE — Addendum Note (Signed)
Addended bySherryle Lis, Jenica Costilow R on: 05/09/2022 09:33 AM   Modules accepted: Orders

## 2022-05-10 ENCOUNTER — Encounter: Payer: Self-pay | Admitting: Vascular Surgery

## 2022-05-10 ENCOUNTER — Encounter (HOSPITAL_COMMUNITY): Payer: Self-pay

## 2022-05-10 ENCOUNTER — Inpatient Hospital Stay (HOSPITAL_COMMUNITY)
Admission: EM | Admit: 2022-05-10 | Discharge: 2022-06-10 | DRG: 463 | Disposition: A | Discharge status: Skilled Nursing Facility | Payer: Medicare PPO | Attending: Family Medicine | Admitting: Family Medicine

## 2022-05-10 ENCOUNTER — Telehealth: Payer: Self-pay | Admitting: *Deleted

## 2022-05-10 ENCOUNTER — Emergency Department (HOSPITAL_COMMUNITY): Payer: Medicare PPO

## 2022-05-10 ENCOUNTER — Telehealth: Payer: Self-pay

## 2022-05-10 DIAGNOSIS — I96 Gangrene, not elsewhere classified: Secondary | ICD-10-CM | POA: Diagnosis present

## 2022-05-10 DIAGNOSIS — R509 Fever, unspecified: Secondary | ICD-10-CM | POA: Diagnosis not present

## 2022-05-10 DIAGNOSIS — R079 Chest pain, unspecified: Secondary | ICD-10-CM

## 2022-05-10 DIAGNOSIS — R0602 Shortness of breath: Secondary | ICD-10-CM | POA: Diagnosis not present

## 2022-05-10 DIAGNOSIS — Z79890 Hormone replacement therapy (postmenopausal): Secondary | ICD-10-CM

## 2022-05-10 DIAGNOSIS — I639 Cerebral infarction, unspecified: Secondary | ICD-10-CM | POA: Diagnosis not present

## 2022-05-10 DIAGNOSIS — R569 Unspecified convulsions: Secondary | ICD-10-CM | POA: Diagnosis present

## 2022-05-10 DIAGNOSIS — R1311 Dysphagia, oral phase: Secondary | ICD-10-CM | POA: Diagnosis not present

## 2022-05-10 DIAGNOSIS — Z992 Dependence on renal dialysis: Secondary | ICD-10-CM | POA: Diagnosis not present

## 2022-05-10 DIAGNOSIS — Z89411 Acquired absence of right great toe: Secondary | ICD-10-CM

## 2022-05-10 DIAGNOSIS — Z888 Allergy status to other drugs, medicaments and biological substances status: Secondary | ICD-10-CM

## 2022-05-10 DIAGNOSIS — I6381 Other cerebral infarction due to occlusion or stenosis of small artery: Secondary | ICD-10-CM | POA: Diagnosis not present

## 2022-05-10 DIAGNOSIS — Z4781 Encounter for orthopedic aftercare following surgical amputation: Secondary | ICD-10-CM | POA: Diagnosis not present

## 2022-05-10 DIAGNOSIS — T8789 Other complications of amputation stump: Secondary | ICD-10-CM | POA: Diagnosis present

## 2022-05-10 DIAGNOSIS — R41841 Cognitive communication deficit: Secondary | ICD-10-CM | POA: Diagnosis not present

## 2022-05-10 DIAGNOSIS — D72829 Elevated white blood cell count, unspecified: Secondary | ICD-10-CM | POA: Diagnosis not present

## 2022-05-10 DIAGNOSIS — I2510 Atherosclerotic heart disease of native coronary artery without angina pectoris: Secondary | ICD-10-CM | POA: Diagnosis not present

## 2022-05-10 DIAGNOSIS — Z79899 Other long term (current) drug therapy: Secondary | ICD-10-CM

## 2022-05-10 DIAGNOSIS — Y835 Amputation of limb(s) as the cause of abnormal reaction of the patient, or of later complication, without mention of misadventure at the time of the procedure: Secondary | ICD-10-CM | POA: Diagnosis present

## 2022-05-10 DIAGNOSIS — I69354 Hemiplegia and hemiparesis following cerebral infarction affecting left non-dominant side: Secondary | ICD-10-CM | POA: Diagnosis not present

## 2022-05-10 DIAGNOSIS — R600 Localized edema: Secondary | ICD-10-CM | POA: Diagnosis not present

## 2022-05-10 DIAGNOSIS — Z91018 Allergy to other foods: Secondary | ICD-10-CM

## 2022-05-10 DIAGNOSIS — N1832 Chronic kidney disease, stage 3b: Secondary | ICD-10-CM

## 2022-05-10 DIAGNOSIS — E1165 Type 2 diabetes mellitus with hyperglycemia: Secondary | ICD-10-CM | POA: Diagnosis present

## 2022-05-10 DIAGNOSIS — L8990 Pressure ulcer of unspecified site, unspecified stage: Secondary | ICD-10-CM | POA: Diagnosis not present

## 2022-05-10 DIAGNOSIS — S88111A Complete traumatic amputation at level between knee and ankle, right lower leg, initial encounter: Secondary | ICD-10-CM | POA: Diagnosis not present

## 2022-05-10 DIAGNOSIS — R072 Precordial pain: Secondary | ICD-10-CM | POA: Diagnosis not present

## 2022-05-10 DIAGNOSIS — E11319 Type 2 diabetes mellitus with unspecified diabetic retinopathy without macular edema: Secondary | ICD-10-CM | POA: Diagnosis present

## 2022-05-10 DIAGNOSIS — T8781 Dehiscence of amputation stump: Secondary | ICD-10-CM | POA: Diagnosis not present

## 2022-05-10 DIAGNOSIS — Z9861 Coronary angioplasty status: Secondary | ICD-10-CM

## 2022-05-10 DIAGNOSIS — G9340 Encephalopathy, unspecified: Secondary | ICD-10-CM

## 2022-05-10 DIAGNOSIS — E669 Obesity, unspecified: Secondary | ICD-10-CM | POA: Diagnosis present

## 2022-05-10 DIAGNOSIS — Z91014 Allergy to mammalian meats: Secondary | ICD-10-CM

## 2022-05-10 DIAGNOSIS — D72828 Other elevated white blood cell count: Secondary | ICD-10-CM | POA: Diagnosis not present

## 2022-05-10 DIAGNOSIS — N2889 Other specified disorders of kidney and ureter: Secondary | ICD-10-CM

## 2022-05-10 DIAGNOSIS — Z87892 Personal history of anaphylaxis: Secondary | ICD-10-CM

## 2022-05-10 DIAGNOSIS — E1152 Type 2 diabetes mellitus with diabetic peripheral angiopathy with gangrene: Secondary | ICD-10-CM | POA: Diagnosis present

## 2022-05-10 DIAGNOSIS — Z6830 Body mass index (BMI) 30.0-30.9, adult: Secondary | ICD-10-CM

## 2022-05-10 DIAGNOSIS — R739 Hyperglycemia, unspecified: Secondary | ICD-10-CM | POA: Diagnosis not present

## 2022-05-10 DIAGNOSIS — L03115 Cellulitis of right lower limb: Secondary | ICD-10-CM | POA: Diagnosis present

## 2022-05-10 DIAGNOSIS — I6389 Other cerebral infarction: Secondary | ICD-10-CM | POA: Diagnosis not present

## 2022-05-10 DIAGNOSIS — Z881 Allergy status to other antibiotic agents status: Secondary | ICD-10-CM

## 2022-05-10 DIAGNOSIS — I132 Hypertensive heart and chronic kidney disease with heart failure and with stage 5 chronic kidney disease, or end stage renal disease: Secondary | ICD-10-CM | POA: Diagnosis present

## 2022-05-10 DIAGNOSIS — M8580 Other specified disorders of bone density and structure, unspecified site: Secondary | ICD-10-CM | POA: Diagnosis present

## 2022-05-10 DIAGNOSIS — N261 Atrophy of kidney (terminal): Secondary | ICD-10-CM | POA: Diagnosis not present

## 2022-05-10 DIAGNOSIS — H2523 Age-related cataract, morgagnian type, bilateral: Secondary | ICD-10-CM | POA: Diagnosis not present

## 2022-05-10 DIAGNOSIS — J9811 Atelectasis: Secondary | ICD-10-CM | POA: Diagnosis not present

## 2022-05-10 DIAGNOSIS — M7731 Calcaneal spur, right foot: Secondary | ICD-10-CM | POA: Diagnosis not present

## 2022-05-10 DIAGNOSIS — Z8673 Personal history of transient ischemic attack (TIA), and cerebral infarction without residual deficits: Secondary | ICD-10-CM | POA: Diagnosis not present

## 2022-05-10 DIAGNOSIS — I10 Essential (primary) hypertension: Secondary | ICD-10-CM | POA: Diagnosis not present

## 2022-05-10 DIAGNOSIS — E1122 Type 2 diabetes mellitus with diabetic chronic kidney disease: Secondary | ICD-10-CM | POA: Diagnosis not present

## 2022-05-10 DIAGNOSIS — M79671 Pain in right foot: Secondary | ICD-10-CM | POA: Diagnosis not present

## 2022-05-10 DIAGNOSIS — I739 Peripheral vascular disease, unspecified: Secondary | ICD-10-CM | POA: Diagnosis not present

## 2022-05-10 DIAGNOSIS — J4520 Mild intermittent asthma, uncomplicated: Secondary | ICD-10-CM | POA: Diagnosis not present

## 2022-05-10 DIAGNOSIS — R2689 Other abnormalities of gait and mobility: Secondary | ICD-10-CM | POA: Diagnosis not present

## 2022-05-10 DIAGNOSIS — M7989 Other specified soft tissue disorders: Secondary | ICD-10-CM | POA: Diagnosis not present

## 2022-05-10 DIAGNOSIS — I69391 Dysphagia following cerebral infarction: Secondary | ICD-10-CM | POA: Diagnosis not present

## 2022-05-10 DIAGNOSIS — T8743 Infection of amputation stump, right lower extremity: Secondary | ICD-10-CM | POA: Diagnosis not present

## 2022-05-10 DIAGNOSIS — M6281 Muscle weakness (generalized): Secondary | ICD-10-CM | POA: Diagnosis not present

## 2022-05-10 DIAGNOSIS — G9389 Other specified disorders of brain: Secondary | ICD-10-CM | POA: Diagnosis not present

## 2022-05-10 DIAGNOSIS — E119 Type 2 diabetes mellitus without complications: Secondary | ICD-10-CM

## 2022-05-10 DIAGNOSIS — I509 Heart failure, unspecified: Secondary | ICD-10-CM | POA: Diagnosis not present

## 2022-05-10 DIAGNOSIS — I953 Hypotension of hemodialysis: Secondary | ICD-10-CM | POA: Diagnosis not present

## 2022-05-10 DIAGNOSIS — R269 Unspecified abnormalities of gait and mobility: Secondary | ICD-10-CM | POA: Diagnosis not present

## 2022-05-10 DIAGNOSIS — G9341 Metabolic encephalopathy: Secondary | ICD-10-CM | POA: Diagnosis not present

## 2022-05-10 DIAGNOSIS — E11649 Type 2 diabetes mellitus with hypoglycemia without coma: Secondary | ICD-10-CM | POA: Diagnosis not present

## 2022-05-10 DIAGNOSIS — Z7982 Long term (current) use of aspirin: Secondary | ICD-10-CM | POA: Diagnosis not present

## 2022-05-10 DIAGNOSIS — Z91010 Allergy to peanuts: Secondary | ICD-10-CM

## 2022-05-10 DIAGNOSIS — L089 Local infection of the skin and subcutaneous tissue, unspecified: Secondary | ICD-10-CM | POA: Diagnosis not present

## 2022-05-10 DIAGNOSIS — E46 Unspecified protein-calorie malnutrition: Secondary | ICD-10-CM | POA: Diagnosis not present

## 2022-05-10 DIAGNOSIS — J45909 Unspecified asthma, uncomplicated: Secondary | ICD-10-CM | POA: Diagnosis present

## 2022-05-10 DIAGNOSIS — Z794 Long term (current) use of insulin: Secondary | ICD-10-CM

## 2022-05-10 DIAGNOSIS — Z833 Family history of diabetes mellitus: Secondary | ICD-10-CM

## 2022-05-10 DIAGNOSIS — N189 Chronic kidney disease, unspecified: Secondary | ICD-10-CM | POA: Diagnosis present

## 2022-05-10 DIAGNOSIS — A419 Sepsis, unspecified organism: Secondary | ICD-10-CM | POA: Diagnosis not present

## 2022-05-10 DIAGNOSIS — R2681 Unsteadiness on feet: Secondary | ICD-10-CM | POA: Diagnosis not present

## 2022-05-10 DIAGNOSIS — I120 Hypertensive chronic kidney disease with stage 5 chronic kidney disease or end stage renal disease: Secondary | ICD-10-CM | POA: Diagnosis not present

## 2022-05-10 DIAGNOSIS — E11621 Type 2 diabetes mellitus with foot ulcer: Secondary | ICD-10-CM | POA: Diagnosis present

## 2022-05-10 DIAGNOSIS — K429 Umbilical hernia without obstruction or gangrene: Secondary | ICD-10-CM | POA: Diagnosis not present

## 2022-05-10 DIAGNOSIS — L97519 Non-pressure chronic ulcer of other part of right foot with unspecified severity: Secondary | ICD-10-CM | POA: Diagnosis present

## 2022-05-10 DIAGNOSIS — R0789 Other chest pain: Secondary | ICD-10-CM | POA: Diagnosis not present

## 2022-05-10 DIAGNOSIS — K449 Diaphragmatic hernia without obstruction or gangrene: Secondary | ICD-10-CM | POA: Diagnosis not present

## 2022-05-10 DIAGNOSIS — E785 Hyperlipidemia, unspecified: Secondary | ICD-10-CM | POA: Diagnosis present

## 2022-05-10 DIAGNOSIS — K5909 Other constipation: Secondary | ICD-10-CM | POA: Diagnosis not present

## 2022-05-10 DIAGNOSIS — T8753 Necrosis of amputation stump, right lower extremity: Secondary | ICD-10-CM | POA: Diagnosis present

## 2022-05-10 DIAGNOSIS — Z91013 Allergy to seafood: Secondary | ICD-10-CM

## 2022-05-10 DIAGNOSIS — N2581 Secondary hyperparathyroidism of renal origin: Secondary | ICD-10-CM | POA: Diagnosis not present

## 2022-05-10 DIAGNOSIS — H9193 Unspecified hearing loss, bilateral: Secondary | ICD-10-CM | POA: Diagnosis not present

## 2022-05-10 DIAGNOSIS — Z7902 Long term (current) use of antithrombotics/antiplatelets: Secondary | ICD-10-CM

## 2022-05-10 DIAGNOSIS — T380X5A Adverse effect of glucocorticoids and synthetic analogues, initial encounter: Secondary | ICD-10-CM | POA: Diagnosis not present

## 2022-05-10 DIAGNOSIS — Z91041 Radiographic dye allergy status: Secondary | ICD-10-CM

## 2022-05-10 DIAGNOSIS — I6930 Unspecified sequelae of cerebral infarction: Secondary | ICD-10-CM

## 2022-05-10 DIAGNOSIS — D631 Anemia in chronic kidney disease: Secondary | ICD-10-CM | POA: Diagnosis present

## 2022-05-10 DIAGNOSIS — S88111S Complete traumatic amputation at level between knee and ankle, right lower leg, sequela: Secondary | ICD-10-CM | POA: Diagnosis not present

## 2022-05-10 DIAGNOSIS — N186 End stage renal disease: Secondary | ICD-10-CM | POA: Diagnosis present

## 2022-05-10 DIAGNOSIS — J189 Pneumonia, unspecified organism: Secondary | ICD-10-CM | POA: Diagnosis not present

## 2022-05-10 DIAGNOSIS — I5042 Chronic combined systolic (congestive) and diastolic (congestive) heart failure: Secondary | ICD-10-CM | POA: Diagnosis present

## 2022-05-10 DIAGNOSIS — R54 Age-related physical debility: Secondary | ICD-10-CM | POA: Diagnosis present

## 2022-05-10 DIAGNOSIS — S88111D Complete traumatic amputation at level between knee and ankle, right lower leg, subsequent encounter: Secondary | ICD-10-CM | POA: Diagnosis not present

## 2022-05-10 DIAGNOSIS — R4182 Altered mental status, unspecified: Secondary | ICD-10-CM | POA: Diagnosis not present

## 2022-05-10 DIAGNOSIS — I5032 Chronic diastolic (congestive) heart failure: Secondary | ICD-10-CM | POA: Diagnosis not present

## 2022-05-10 DIAGNOSIS — R410 Disorientation, unspecified: Secondary | ICD-10-CM | POA: Diagnosis not present

## 2022-05-10 DIAGNOSIS — I151 Hypertension secondary to other renal disorders: Secondary | ICD-10-CM | POA: Diagnosis not present

## 2022-05-10 DIAGNOSIS — E08311 Diabetes mellitus due to underlying condition with unspecified diabetic retinopathy with macular edema: Secondary | ICD-10-CM | POA: Diagnosis not present

## 2022-05-10 DIAGNOSIS — N184 Chronic kidney disease, stage 4 (severe): Secondary | ICD-10-CM | POA: Diagnosis not present

## 2022-05-10 DIAGNOSIS — R29818 Other symptoms and signs involving the nervous system: Secondary | ICD-10-CM | POA: Diagnosis not present

## 2022-05-10 LAB — CBC WITH DIFFERENTIAL/PLATELET
Abs Immature Granulocytes: 0.05 10*3/uL (ref 0.00–0.07)
Basophils Absolute: 0.1 10*3/uL (ref 0.0–0.1)
Basophils Relative: 1 %
Eosinophils Absolute: 0.4 10*3/uL (ref 0.0–0.5)
Eosinophils Relative: 4 %
HCT: 29 % — ABNORMAL LOW (ref 36.0–46.0)
Hemoglobin: 9.6 g/dL — ABNORMAL LOW (ref 12.0–15.0)
Immature Granulocytes: 0 %
Lymphocytes Relative: 22 %
Lymphs Abs: 2.5 10*3/uL (ref 0.7–4.0)
MCH: 28.8 pg (ref 26.0–34.0)
MCHC: 33.1 g/dL (ref 30.0–36.0)
MCV: 87.1 fL (ref 80.0–100.0)
Monocytes Absolute: 1.8 10*3/uL — ABNORMAL HIGH (ref 0.1–1.0)
Monocytes Relative: 15 %
Neutro Abs: 6.8 10*3/uL (ref 1.7–7.7)
Neutrophils Relative %: 58 %
Platelets: 354 10*3/uL (ref 150–400)
RBC: 3.33 MIL/uL — ABNORMAL LOW (ref 3.87–5.11)
RDW: 18.6 % — ABNORMAL HIGH (ref 11.5–15.5)
WBC: 11.7 10*3/uL — ABNORMAL HIGH (ref 4.0–10.5)
nRBC: 0 % (ref 0.0–0.2)

## 2022-05-10 LAB — COMPREHENSIVE METABOLIC PANEL
ALT: 21 U/L (ref 0–44)
AST: 28 U/L (ref 15–41)
Albumin: 3.1 g/dL — ABNORMAL LOW (ref 3.5–5.0)
Alkaline Phosphatase: 57 U/L (ref 38–126)
Anion gap: 14 (ref 5–15)
BUN: 32 mg/dL — ABNORMAL HIGH (ref 8–23)
CO2: 28 mmol/L (ref 22–32)
Calcium: 9.4 mg/dL (ref 8.9–10.3)
Chloride: 94 mmol/L — ABNORMAL LOW (ref 98–111)
Creatinine, Ser: 7.26 mg/dL — ABNORMAL HIGH (ref 0.44–1.00)
GFR, Estimated: 6 mL/min — ABNORMAL LOW (ref >60)
Glucose, Bld: 136 mg/dL — ABNORMAL HIGH (ref 70–99)
Potassium: 4.3 mmol/L (ref 3.5–5.1)
Sodium: 136 mmol/L (ref 135–145)
Total Bilirubin: 0.9 mg/dL (ref 0.3–1.2)
Total Protein: 7.6 g/dL (ref 6.5–8.1)

## 2022-05-10 LAB — PROTIME-INR
INR: 1.2 (ref 0.8–1.2)
Prothrombin Time: 15.1 seconds (ref 11.4–15.2)

## 2022-05-10 LAB — SEDIMENTATION RATE: Sed Rate: 56 mm/hr — ABNORMAL HIGH (ref 0–22)

## 2022-05-10 LAB — CBG MONITORING, ED: Glucose-Capillary: 110 mg/dL — ABNORMAL HIGH (ref 70–99)

## 2022-05-10 LAB — LACTIC ACID, PLASMA
Lactic Acid, Venous: 0.9 mmol/L (ref 0.5–1.9)
Lactic Acid, Venous: 1 mmol/L (ref 0.5–1.9)

## 2022-05-10 LAB — C-REACTIVE PROTEIN: CRP: 9.4 mg/dL — ABNORMAL HIGH (ref <1.0)

## 2022-05-10 MED ORDER — VANCOMYCIN HCL 1750 MG/350ML IV SOLN
1750.0000 mg | Freq: Once | INTRAVENOUS | Status: AC
  Administered 2022-05-10: 1750 mg via INTRAVENOUS
  Filled 2022-05-10: qty 350

## 2022-05-10 MED ORDER — CLOPIDOGREL BISULFATE 75 MG PO TABS: 75.0000 mg | ORAL_TABLET | Freq: Every day | ORAL | Status: DC

## 2022-05-10 MED ORDER — LEVETIRACETAM 500 MG PO TABS
500.0000 mg | ORAL_TABLET | Freq: Every day | ORAL | Status: DC
  Administered 2022-05-10 – 2022-06-10 (×32): 500 mg via ORAL
  Filled 2022-05-10 (×32): qty 1

## 2022-05-10 MED ORDER — INSULIN ASPART 100 UNIT/ML IJ SOLN: 0.0000 [IU] | Freq: Three times a day (TID) | INTRAMUSCULAR | Status: DC

## 2022-05-10 MED ORDER — SODIUM CHLORIDE 0.9 % IV SOLN: INTRAVENOUS | Status: DC

## 2022-05-10 MED ORDER — PANTOPRAZOLE SODIUM 40 MG PO TBEC
40.0000 mg | DELAYED_RELEASE_TABLET | Freq: Every day | ORAL | Status: DC
  Administered 2022-05-11 – 2022-06-10 (×29): 40 mg via ORAL
  Filled 2022-05-10 (×30): qty 1

## 2022-05-10 MED ORDER — FLUTICASONE PROPIONATE 50 MCG/ACT NA SUSP
2.0000 | Freq: Every day | NASAL | Status: DC | PRN
Start: 2022-05-10 — End: 2022-06-10

## 2022-05-10 MED ORDER — FENTANYL CITRATE PF 50 MCG/ML IJ SOSY: 25.0000 ug | PREFILLED_SYRINGE | INTRAMUSCULAR | Status: DC | PRN

## 2022-05-10 MED ORDER — MELATONIN 5 MG PO TABS
5.0000 mg | ORAL_TABLET | Freq: Every day | ORAL | Status: DC
  Administered 2022-05-10 – 2022-05-12 (×3): 5 mg via ORAL
  Filled 2022-05-10 (×3): qty 1

## 2022-05-10 MED ORDER — SODIUM CHLORIDE 0.9 % IV SOLN
2.0000 g | Freq: Once | INTRAVENOUS | Status: AC
  Administered 2022-05-10: 2 g via INTRAVENOUS
  Filled 2022-05-10: qty 12.5

## 2022-05-10 MED ORDER — ASPIRIN 81 MG PO CHEW
81.0000 mg | CHEWABLE_TABLET | Freq: Every day | ORAL | Status: DC
  Administered 2022-05-10 – 2022-05-20 (×10): 81 mg via ORAL
  Filled 2022-05-10 (×10): qty 1

## 2022-05-10 MED ORDER — FENTANYL CITRATE PF 50 MCG/ML IJ SOSY
12.5000 ug | PREFILLED_SYRINGE | INTRAMUSCULAR | Status: DC | PRN
  Administered 2022-05-11: 12.5 ug via INTRAVENOUS
  Filled 2022-05-10: qty 1

## 2022-05-10 MED ORDER — ALBUTEROL SULFATE HFA 108 (90 BASE) MCG/ACT IN AERS: 2.0000 | INHALATION_SPRAY | RESPIRATORY_TRACT | Status: DC | PRN

## 2022-05-10 MED ORDER — INSULIN GLARGINE-YFGN 100 UNIT/ML ~~LOC~~ SOLN: 12.0000 [IU] | Freq: Every day | SUBCUTANEOUS | Status: DC

## 2022-05-10 MED ORDER — VANCOMYCIN HCL IN DEXTROSE 1-5 GM/200ML-% IV SOLN
1000.0000 mg | INTRAVENOUS | Status: DC
  Administered 2022-05-11 – 2022-05-16 (×2): 1000 mg via INTRAVENOUS
  Filled 2022-05-10 (×5): qty 200

## 2022-05-10 MED ORDER — SODIUM CHLORIDE 0.9 % IV SOLN
1.0000 g | INTRAVENOUS | Status: DC
  Administered 2022-05-11 – 2022-05-17 (×7): 1 g via INTRAVENOUS
  Filled 2022-05-10 (×10): qty 10

## 2022-05-10 MED ORDER — HYDRALAZINE HCL 10 MG PO TABS
10.0000 mg | ORAL_TABLET | Freq: Three times a day (TID) | ORAL | Status: DC | PRN
  Filled 2022-05-10: qty 1

## 2022-05-10 MED ORDER — MOMETASONE FURO-FORMOTEROL FUM 200-5 MCG/ACT IN AERO
2.0000 | INHALATION_SPRAY | Freq: Two times a day (BID) | RESPIRATORY_TRACT | Status: DC
  Administered 2022-05-11 – 2022-06-10 (×44): 2 via RESPIRATORY_TRACT
  Filled 2022-05-10 (×4): qty 8.8

## 2022-05-10 MED ORDER — OXYCODONE HCL 5 MG PO TABS
5.0000 mg | ORAL_TABLET | Freq: Once | ORAL | Status: AC
  Administered 2022-05-10: 5 mg via ORAL
  Filled 2022-05-10: qty 1

## 2022-05-10 MED ORDER — ACETAMINOPHEN 325 MG PO TABS
650.0000 mg | ORAL_TABLET | Freq: Four times a day (QID) | ORAL | Status: DC | PRN
Start: 2022-05-10 — End: 2022-05-16
  Administered 2022-05-14 – 2022-05-15 (×3): 650 mg via ORAL
  Filled 2022-05-10 (×3): qty 2

## 2022-05-10 MED ORDER — METHYLPHENIDATE HCL 5 MG PO TABS
5.0000 mg | ORAL_TABLET | Freq: Every day | ORAL | Status: DC
  Administered 2022-05-11 – 2022-05-26 (×13): 5 mg via ORAL
  Filled 2022-05-10 (×13): qty 1

## 2022-05-10 MED ORDER — SEVELAMER CARBONATE 0.8 G PO PACK
0.8000 g | PACK | Freq: Three times a day (TID) | ORAL | Status: DC
  Administered 2022-05-11 – 2022-06-10 (×66): 0.8 g via ORAL
  Filled 2022-05-10 (×71): qty 1

## 2022-05-10 MED ORDER — ROSUVASTATIN CALCIUM 20 MG PO TABS
20.0000 mg | ORAL_TABLET | Freq: Every day | ORAL | Status: DC
  Administered 2022-05-10 – 2022-05-26 (×16): 20 mg via ORAL
  Filled 2022-05-10 (×17): qty 1

## 2022-05-10 MED ORDER — ONDANSETRON HCL 4 MG PO TABS
8.0000 mg | ORAL_TABLET | Freq: Two times a day (BID) | ORAL | Status: DC | PRN
Start: 2022-05-10 — End: 2022-06-10
  Administered 2022-05-11 – 2022-06-04 (×5): 8 mg via ORAL
  Filled 2022-05-10 (×5): qty 2

## 2022-05-10 MED ORDER — FENTANYL CITRATE PF 50 MCG/ML IJ SOSY: 12.5000 ug | PREFILLED_SYRINGE | INTRAMUSCULAR | Status: DC | PRN

## 2022-05-10 MED ORDER — INSULIN GLARGINE-YFGN 100 UNIT/ML ~~LOC~~ SOLN
8.0000 [IU] | Freq: Every day | SUBCUTANEOUS | Status: DC
  Administered 2022-05-11 – 2022-05-12 (×2): 8 [IU] via SUBCUTANEOUS
  Filled 2022-05-10 (×4): qty 0.08

## 2022-05-10 MED ORDER — HYDRALAZINE HCL 20 MG/ML IJ SOLN: 10.0000 mg | Freq: Four times a day (QID) | INTRAMUSCULAR | Status: DC | PRN

## 2022-05-10 MED ORDER — LOSARTAN POTASSIUM 50 MG PO TABS
50.0000 mg | ORAL_TABLET | Freq: Every day | ORAL | Status: DC
Start: 2022-05-10 — End: 2022-05-18
  Administered 2022-05-10 – 2022-05-17 (×8): 50 mg via ORAL
  Filled 2022-05-10 (×8): qty 1

## 2022-05-10 MED ORDER — SENNOSIDES-DOCUSATE SODIUM 8.6-50 MG PO TABS
1.0000 | ORAL_TABLET | Freq: Every day | ORAL | Status: DC | PRN
  Administered 2022-05-24 – 2022-06-10 (×2): 2 via ORAL
  Filled 2022-05-10 (×2): qty 2

## 2022-05-10 MED ORDER — ALBUTEROL SULFATE (2.5 MG/3ML) 0.083% IN NEBU
2.5000 mg | INHALATION_SOLUTION | RESPIRATORY_TRACT | Status: DC | PRN
  Administered 2022-06-09: 2.5 mg via RESPIRATORY_TRACT
  Filled 2022-05-10: qty 3

## 2022-05-10 MED ORDER — DIPHENHYDRAMINE HCL 25 MG PO TABS
25.0000 mg | ORAL_TABLET | Freq: Every day | ORAL | Status: DC
  Administered 2022-05-10: 25 mg via ORAL
  Filled 2022-05-10 (×4): qty 1

## 2022-05-10 MED ORDER — INSULIN ASPART 100 UNIT/ML IJ SOLN
0.0000 [IU] | Freq: Three times a day (TID) | INTRAMUSCULAR | Status: DC
  Administered 2022-05-13 – 2022-05-19 (×6): 1 [IU] via SUBCUTANEOUS
  Administered 2022-05-20: 2 [IU] via SUBCUTANEOUS
  Administered 2022-05-20 – 2022-05-21 (×4): 3 [IU] via SUBCUTANEOUS

## 2022-05-10 MED ORDER — CARVEDILOL 3.125 MG PO TABS
3.1250 mg | ORAL_TABLET | Freq: Two times a day (BID) | ORAL | Status: DC
  Administered 2022-05-11 – 2022-06-09 (×45): 3.125 mg via ORAL
  Filled 2022-05-10 (×50): qty 1

## 2022-05-10 MED ORDER — OXYCODONE HCL 5 MG PO TABS
5.0000 mg | ORAL_TABLET | ORAL | Status: DC | PRN
  Administered 2022-05-11: 5 mg via ORAL
  Filled 2022-05-10 (×2): qty 1

## 2022-05-10 NOTE — ED Notes (Signed)
Received verbal report from [place] at this time

## 2022-05-10 NOTE — Telephone Encounter (Signed)
Patient has been scheduled for 07/25/'@1'$ :15.

## 2022-05-10 NOTE — ED Triage Notes (Signed)
Pt BIB [facility] for eval of R foot infection. Pt had partial amputation 1 month ago and now starting w/ pain, warmth to foot 2-3 days. Seen by surgeon on Thursday upon completion of oral antibiotics. Receiving wound care to R foot outpt, and now w/ more open wounds and black areas. No systemic s/sx of sepsis per EMS, VSS, afebrile, non tachycardic.

## 2022-05-10 NOTE — H&P (Addendum)
History and Physical    DOA: 05/10/2022  PCP: [name], MD  Patient coming from: Home  Chief Complaint: Worsening right foot infection  HPI: Carolyn Russo is a 71 y.o. female with history h/o hypertension, HLD, type 2 DM, CAD, CVA, PAD s/p recent stent and on Plavix, ESRD recently initiated on HD MWF (last dialysis session was yesterday) who had undergone right great toe amputation about a month back presents with complaints of increasing pain, swelling and discoloration to the right foot.  Patient's husband at bedside and reports that patient has been steadily worsening in spite of follow-up with podiatry Dr. Earleen Newport recently apparently completed 2 weeks of antibiotic treatment (Cefadroxil 1000 mg with HD).  He also contacted vascular surgery, Dr. Vella Redhead, with whom patient was supposed to follow-up for hyperbaric treatment with his concerns. ED course: Patient noted to have discoloration/gangrenous changes extending to the right forefoot.  Afebrile, pulse 70-80, respiratory rate 18-20, SBP 160-190.  Labs show WBC 11.7, hemoglobin 9.6, HCT 29, platelet 354, sodium 136, potassium 4.3, chloride 94, bicarb 28, BUN 32, creatinine 7.26, INR 1.2, blood glucose 136, CRP 9.4, lactic acid 0.9.  EDP discussed with podiatry Dr. Earleen Newport, regarding concern for worsening foot infection and extending dry gangrenous changes, who recommended to obtain MRI of the foot and advised admission with IV antibiotics.  Patient currently somnolent (received IV fentanyl earlier) and unable to provide any history.  Husband at bedside and stated patient was communicative earlier and is sensitive to pain medications.  He feels she is would be able to tolerate p.o. meds and diet once pain medications wear off.  Review of Systems: As per HPI, otherwise review of systems negative.    Past Medical History:  Diagnosis Date   Acute GI bleeding    Allergy    Anemia    Anterior chest wall pain    Appendicitis 1965   Asthma     Body mass index 37.0-37.9, adult    Breast pain    Cataract    both eyes   CHF (congestive heart failure) ([place])    Chronic kidney disease    stage 5 - on dialysis   Cognitive change 04/20/2021   r/t cva 01/813   Complication of anesthesia    Memory loss after general   Dehydration 2014   Deviated septum 1971   Diabetes mellitus    Dysphagia due to old stroke    easy to get strangled when eating   Dyspnea 2014   Extrinsic asthma    WITH ASTHMA ATTACK   Fibroid 1980   GERD (gastroesophageal reflux disease)    Heart murmur    History of migraine    History of seizure    with stroke   Hx gestational diabetes    Hyperlipidemia    Hypertension 2014   Inguinal hernia 1959   Malaise and fatigue 2014   Non-IgE mediated allergic asthma 2014   Obesity    Pelvic pain    Pregnancy, high-risk 1985   Stroke ([place]) 04/20/2021   (CVA) of right basal ganglia   Tonsillitis 1968   Uterine fibroid 1980   Visual field defect    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    Past Surgical History:  Procedure Laterality Date   ABDOMINAL AORTOGRAM W/LOWER EXTREMITY N/A 02/21/2022   Procedure: ABDOMINAL AORTOGRAM W/LOWER EXTREMITY;  Surgeon: Waynetta Sandy, MD;  Location: [facility];  Service: Cardiovascular;  Laterality: N/A;   AMPUTATION TOE Right 04/15/2022   Procedure:  AMPUTATION  LST TOERIGHT FOOT;  Surgeon: Criselda Peaches, DPM;  Location: [facility];  Service: Podiatry;  Laterality: Right;   APPENDECTOMY  6659   BASCILIC VEIN TRANSPOSITION Right 04/30/2021   Procedure: RIGHT FIRST STAGE [place];  Surgeon: Angelia Mould, MD;  Location: [facility];  Service: Vascular;  Laterality: Right;   CESAREAN SECTION  1985   COLONOSCOPY     ESOPHAGOGASTRODUODENOSCOPY (EGD) WITH PROPOFOL N/A 04/18/2021   Procedure: ESOPHAGOGASTRODUODENOSCOPY (EGD) WITH PROPOFOL;  Surgeon: Gatha Mayer, MD;  Location: [facility];  Service: Endoscopy;  Laterality: N/A;   EYE SURGERY     bilateral  cataract    FLEXIBLE SIGMOIDOSCOPY N/A 04/18/2021   Procedure: FLEXIBLE SIGMOIDOSCOPY;  Surgeon: Gatha Mayer, MD;  Location: [facility];  Service: Endoscopy;  Laterality: N/A;   HERNIA REPAIR  1959   IR FLUORO GUIDE CV LINE RIGHT  03/18/2021   IR US GUIDE VASC ACCESS RIGHT  03/18/2021   LEFT HEART CATH AND CORONARY ANGIOGRAPHY N/A 04/04/2017   Procedure: Left Heart Cath and Coronary Angiography;  Surgeon: Belva Crome, MD;  Location: [facility];  Service: Cardiovascular;  Laterality: N/A;   [place], 2004, 2007   PERIPHERAL VASCULAR THROMBECTOMY  02/21/2022   Procedure: PERIPHERAL VASCULAR THROMBECTOMY;  Surgeon: Waynetta Sandy, MD;  Location: [facility];  Service: Cardiovascular;;  Right Tibial   RHINOPLASTY  1971   ROTATOR CUFF REPAIR  2003   SURGICAL REPAIR OF HEMORRHAGE  2015   TONSILLECTOMY  1968    Social history:  reports that she has never smoked. She has never used smokeless tobacco. She reports that she does not drink alcohol and does not use drugs.   Allergies  Allergen Reactions   Food Anaphylaxis    Peanuts; Almonds   Wheat Bran     Upset stomach    Statins Itching and Other (See Comments)    Generalized aches- tolerates crestor    Pork-Derived Products     Does not eat pork    Shellfish Allergy Other (See Comments)    Mouth gets raw   Sitagliptin Other (See Comments)    Januiva - unknown reaction    Tetracycline Other (See Comments)    Raw mouth   Contrast Media [Iodinated Contrast Media] Rash    Happened during CT scan over 30 years ago    Family History  Problem Relation Age of Onset   Cancer Mother        abdominal melamona   Psoriasis Mother    Alzheimer's disease Father    Cancer Cousin        colon    Diabetes Cousin    Diabetes Maternal Aunt    Colon cancer Neg Hx    Colon polyps Neg Hx    Esophageal cancer Neg Hx    Rectal cancer Neg Hx    Stomach cancer Neg Hx    Breast cancer Neg Hx       Prior  to Admission medications   Medication Sig Start Date End Date Taking? Authorizing Provider  acetaminophen (TYLENOL) 500 MG tablet Take 1,000 mg by mouth 2 (two) times daily as needed (leg and arm pain).   Yes [provider]  albuterol (VENTOLIN HFA) 108 (90 Base) MCG/ACT inhaler Inhale 2 puffs into the lungs every 4 (four) hours as needed for wheezing or shortness of breath. 04/13/22  Yes Minette Brine, FNP  Alcaftadine (LASTACAFT) 0.25 % SOLN Place 1 drop into  both eyes daily as needed (itching).   Yes [provider]  ARTIFICIAL TEAR SOLUTION OP Place 1 drop into both eyes daily as needed (dry eyes).   Yes [provider]  carvedilol (COREG) 3.125 MG tablet Take 1 tablet (3.125 mg total) by mouth 2 (two) times daily with a meal. 11/01/21  Yes [name], MD  clopidogrel (PLAVIX) 75 MG tablet TAKE 1 TABLET(75 MG) BY MOUTH DAILY 02/11/22  Yes [name], MD  diclofenac Sodium (VOLTAREN) 1 % GEL Apply 2 g topically 4 (four) times daily. To left wrist Patient taking differently: Apply 2 g topically daily as needed (pain). To left wrist 05/26/21  Yes Love, Ivan Anchors, PA-C  diphenhydrAMINE (BENADRYL) 2 % cream Apply 1 application. topically daily as needed for itching.   Yes [provider]  diphenhydrAMINE (BENADRYL) 25 MG tablet Take 25 mg by mouth at bedtime.   Yes [provider]  fluticasone (FLONASE) 50 MCG/ACT nasal spray Place 2 sprays into the nose daily as needed for allergies.    Yes [provider]  Insulin Aspart, w/Niacinamide, (FIASP) 100 UNIT/ML SOLN Inject 6-16 Units as directed 3 (three) times daily as needed (high blood sugar). Sliding scale 100-150=6 units 151-200=10 units 201-250=12 units 251-300=14 units 301-350=16 units Greater than 350 = 16 units   Yes [provider]  insulin glargine (LANTUS) 100 UNIT/ML Solostar Pen Inject 13 Units into the skin daily. Patient taking differently: Inject 12 Units into the skin  at bedtime. 08/18/21  Yes Ghumman, Ramandeep, NP  levETIRAcetam (KEPPRA) 500 MG tablet TAKE 1 TABLET(500 MG) BY MOUTH AT BEDTIME 01/07/22  Yes Lovorn, Megan, MD  Lidocaine 4 % GEL Apply 1 Application topically daily as needed (pain).   Yes [provider]  losartan (COZAAR) 50 MG tablet Take 50 mg by mouth daily. 01/21/22  Yes [provider]  melatonin 5 MG TABS Take 5 mg by mouth at bedtime.   Yes [provider]  methylphenidate (RITALIN) 5 MG tablet Take 1 tablet (5 mg total) by mouth 2 (two) times daily with breakfast and lunch. Due to stroke attention Patient taking differently: Take 5 mg by mouth daily. Due to stroke attention 03/04/22  Yes Lovorn, Jinny Blossom, MD  mometasone-formoterol (DULERA) 200-5 MCG/ACT AERO Inhale 2 puffs into the lungs 2 (two) times daily. 04/15/22  Yes [name], MD  ondansetron (ZOFRAN) 8 MG tablet Take 8 mg by mouth 2 (two) times daily as needed for refractory nausea / vomiting.   Yes [provider]  ondansetron (ZOFRAN-ODT) 4 MG disintegrating tablet Take 1 tablet (4 mg total) by mouth every 8 (eight) hours as needed for nausea or vomiting. 03/07/22  Yes Curatolo, Adam, DO  oxyCODONE (OXY IR/ROXICODONE) 5 MG immediate release tablet Take 1 tablet (5 mg total) by mouth every 4 (four) hours as needed for up to 7 days for severe pain. 05/09/22 05/16/22 Yes McDonald, Stephan Minister, DPM  pantoprazole (PROTONIX) 40 MG tablet Take 1 tablet (40 mg total) by mouth daily. 02/07/22  Yes [name], MD  rosuvastatin (CRESTOR) 20 MG tablet Take 1 tablet (20 mg total) by mouth daily. 08/18/21  Yes Ghumman, Ramandeep, NP  senna-docusate (SENOKOT-S) 8.6-50 MG tablet Take 2 tablets by mouth 2 (two) times daily. Patient taking differently: Take 1-2 tablets by mouth daily as needed for mild constipation or moderate constipation. 06/29/21  Yes Ghumman, Ramandeep, NP  sevelamer carbonate (RENVELA) 0.8 g PACK packet Take 0.8 g by mouth 3 (three) times daily with  meals. 10/19/21  Yes [provider]  cefadroxil (DURICEF) 500 MG capsule Take 2 capsules (1,000 mg total) by mouth every Monday, Wednesday, and Friday with hemodialysis. 05/04/22   Criselda Peaches, DPM  Continuous Blood Gluc Receiver (FREESTYLE LIBRE 2 READER) DEVI by Does not apply route.    [provider]  hydrALAZINE (APRESOLINE) 10 MG tablet Take 1 tablet (10 mg total) by mouth every 8 (eight) hours as needed (Sys BP >180, hold on HD days). Patient not taking: Reported on 05/10/2022 05/26/21   Love, Ivan Anchors, PA-C  hydrocortisone (ANUSOL-HC) 2.5 % rectal cream Place 1 application rectally 2 (two) times daily as needed for hemorrhoids or anal itching. Patient not taking: Reported on 05/10/2022 04/20/21   Jonetta Osgood, MD  Insulin Pen Needle (BD PEN NEEDLE NANO 2ND GEN) 32G X 4 MM MISC See admin instructions. 07/15/21   [provider]  iron sucrose in sodium chloride 0.9 % 100 mL Inject 100 mg into the vein as needed (based on labs). 06/16/21   [provider]  Methoxy PEG-Epoetin Beta (MIRCERA IJ) Mircera 07/21/21 07/20/22  [provider]  mupirocin ointment (BACTROBAN) 2 % Apply 1 application. topically 2 (two) times daily. Patient not taking: Reported on 05/10/2022 02/01/22   Criselda Peaches, DPM  Nutritional Supplements (,FEEDING SUPPLEMENT, PROSOURCE PLUS) liquid Take 30 mLs by mouth 2 (two) times daily between meals. Patient not taking: Reported on 04/14/2022 05/27/21   Love, Ivan Anchors, PA-C  OneTouch Delica Lancets 57Q MISC Use as directed to check blood sugars before breakfast and dinner dx: e11.65 08/18/21   Ghumman, Milford Cage, NP  traMADol (ULTRAM) 50 MG tablet TAKE 1 TABLET(50 MG) BY MOUTH EVERY 12 HOURS FOR 5 DAYS Patient not taking: Reported on 05/10/2022 03/28/22   Criselda Peaches, DPM  iron polysaccharides (NIFEREX) 150 MG capsule Take 1 capsule (150 mg total) by mouth daily. Patient not taking: No sig reported 03/24/21 04/16/21  Nita Sells, MD    Physical Exam: Vitals:   05/10/22 1615 05/10/22 1630 05/10/22 1805 05/10/22 1805  BP: (!) 167/154 (!) 162/88 (!) 169/74   Pulse: 75 77 70   Resp:  18 18   Temp:    98.3 F (36.8 C)  TempSrc:    Oral  SpO2: 98% 98% 96%   Weight:      Height:        Constitutional: Patient drowsy and sedated from pain medications. ENMT: Mucous membranes are moist. Posterior pharynx clear of any exudate or lesions.Normal dentition.  Neck: normal, supple, no masses, no thyromegaly Respiratory: clear to auscultation bilaterally, no wheezing, no crackles. Normal respiratory effort. No accessory muscle use.  Cardiovascular: Regular rate and rhythm, no murmurs / rubs / gallops. No extremity edema. 2+ pedal pulses. No carotid bruits.  Abdomen: no tenderness, no masses palpated. No hepatosplenomegaly. Bowel sounds positive.  Neurologic: Sedated, no facial droop, moving all extremities on tactile stimuli.  Could not assess further, psychiatric: Unable to assess due to sedation  SKIN/catheters: S/p recent right great toe amputation, now with gangrenous changes and staples along the right forefoot.  Pedal edema noted along the left foot     Labs on Admission: I have personally reviewed following labs and imaging studies  CBC: Recent Labs  Lab 05/10/22 1442  WBC 11.7*  NEUTROABS 6.8  HGB 9.6*  HCT 29.0*  MCV 87.1  PLT 469   Basic Metabolic Panel: Recent Labs  Lab 05/10/22 1442  NA 136  K 4.3  CL 94*  CO2 28  GLUCOSE 136*  BUN 32*  CREATININE 7.26*  CALCIUM 9.4   GFR: Estimated Creatinine Clearance: 6.9 mL/min (A) (by C-G formula based on SCr of 7.26 mg/dL (H)). Recent Labs  Lab 05/10/22 1442 05/10/22 1546  WBC 11.7*  --   LATICACIDVEN 0.9 1.0   Liver Function Tests: Recent Labs  Lab 05/10/22 1442  AST 28  ALT 21  ALKPHOS 57  BILITOT 0.9  PROT 7.6  ALBUMIN 3.1*   No results for input(s): "LIPASE", "AMYLASE" in the last 168 hours. No results for input(s):  "AMMONIA" in the last 168 hours. Coagulation Profile: Recent Labs  Lab 05/10/22 1442  INR 1.2   Cardiac Enzymes: No results for input(s): "CKTOTAL", "CKMB", "CKMBINDEX", "TROPONINI" in the last 168 hours. BNP (last 3 results) No results for input(s): "PROBNP" in the last 8760 hours. HbA1C: No results for input(s): "HGBA1C" in the last 72 hours. CBG: No results for input(s): "GLUCAP" in the last 168 hours. Lipid Profile: No results for input(s): "CHOL", "HDL", "LDLCALC", "TRIG", "CHOLHDL", "LDLDIRECT" in the last 72 hours. Thyroid Function Tests: No results for input(s): "TSH", "T4TOTAL", "FREET4", "T3FREE", "THYROIDAB" in the last 72 hours. Anemia Panel: No results for input(s): "VITAMINB12", "FOLATE", "FERRITIN", "TIBC", "IRON", "RETICCTPCT" in the last 72 hours. Urine analysis:    Component Value Date/Time   COLORURINE YELLOW 03/09/2021 2156   APPEARANCEUR HAZY (A) 03/09/2021 2156   LABSPEC 1.015 03/09/2021 2156   PHURINE 6.0 03/09/2021 2156   GLUCOSEU >=500 (A) 03/09/2021 2156   HGBUR SMALL (A) 03/09/2021 2156   BILIRUBINUR NEGATIVE 03/09/2021 2156   BILIRUBINUR negative 06/18/2019 1409   KETONESUR NEGATIVE 03/09/2021 2156   PROTEINUR >=300 (A) 03/09/2021 2156   UROBILINOGEN 0.2 06/18/2019 1409   NITRITE NEGATIVE 03/09/2021 2156   LEUKOCYTESUR NEGATIVE 03/09/2021 2156    Radiological Exams on Admission: Personally reviewed  DG Foot Complete Right  Result Date: 05/10/2022 CLINICAL DATA:  Partial amputation 1 month ago, pain and warmth EXAM: RIGHT FOOT COMPLETE - 3+ VIEW COMPARISON:  None Available. FINDINGS: Previous amputation across the first metatarsal, with overlying skin staples. There are some subcutaneous gas bubbles just distal to the amputation in the soft tissues. No convincing cortical loss to indicate osteomyelitis. No fracture or dislocation. Diffuse osteopenia. Small calcaneal spur. Extensive arterial calcifications in the foot. IMPRESSION: 1. Previous  amputation across the first metatarsal. Regional subcutaneous gas bubbles conceivably postoperative although infection cannot be entirely excluded. 2. No radiographic evidence of osteomyelitis. Electronically Signed   By: Lucrezia Europe M.D.   On: 05/10/2022 16:56         Assessment and Plan:   Principal Problem:   Foot infection Active Problems:   Gangrene of toe (HCC)   ESRD on dialysis ([place])   PAD (peripheral artery disease) (HCC)   Type 2 diabetes mellitus ([place])   Hypertensive disorder   Dyslipidemia (high LDL; low HDL)   Obesity (BMI 30-39.9)   CAD (coronary artery disease), native coronary artery   Asthma   Anemia in chronic kidney disease   Diabetic retinopathy ([place])   Basal ganglia infarction ([place])    1.Right foot infection with gangrenous changes: Will admit with IV vancomycin/cefepime and obtain MRI to rule out osteomyelitis per podiatry recommendations.  Podiatry to follow-up in a.m. and advise regarding need for repeat surgical intervention.  Will hold Plavix for now (patient already took morning dose according to husband).  Follow-up blood cultures that were sent through ED.  Cautious with IV pain  medication dosing given patient's sensitivity and current sedation level.  2.  Peripheral arterial disease: S/p recent PTCA per husband.  Hold Plavix for now until surgical plan clear.  Husband already contacted vascular surgery, Dr. Alben Spittle formal evaluation in a.m .  3.  ESRD: Sent message to [facility] for resuming hemodialysis.  Resume other home medications  4.  Diabetes mellitus type 2 with hyperlipidemia, retinopathy and CKD: Will resume Lantus insulin at lower dose and high sensitive SSI.  According to husband patient recently has been having hypoglycemia-noted higher dose of short-acting insulin on home sliding scale regimen.  May need titration of doses given new dialysis patient and will benefit from diabetes education prior to discharge  5.  History of CVA:  Patient on Plavix per home medication list.  Will add aspirin until surgical plan clear.  Resume statins.  Currently moving all extremities, unclear if she has residual effects from prior CVA.  6.  Hypertension: Resume home medications and IV hydralazine as needed ordered.  8.  CAD: Resume home medications, statins and aspirin as discussed above.  9. Asthma: Resume home inhaler therapy.  Neb treatments as needed  10.  Anemia of chronic kidney disease: Stable with hemoglobin around 9.5.  Iron replacement per nephrology as needed  11.  Obesity (BMI 30-39.9): No acute issues.  Follow-up PCP for advise on diet and lifestyle modifications.  DVT prophylaxis: Lovenox her Code Status: Full code as confirmed with husband at bedside   .Health care proxy would be her husband  Patient/Family Communication: Discussed with patient's husband and all questions answered to satisfaction.  Consults called: Podiatry.  To call vascular surgery in a.m. Admission status :I certify that at the point of admission it is my clinical judgment that the patient will require inpatient hospital care spanning beyond 2 midnights from the point of admission due to high intensity of service and high frequency of surveillance required.Inpatient status is judged to be reasonable and necessary in order to provide the required intensity of service to ensure the patient's safety. The patient's presenting symptoms, physical exam findings, and initial radiographic and laboratory data in the context of their chronic comorbidities is felt to place them at high risk for further clinical deterioration. The following factors support the patient status of inpatient : Postsurgical foot infection with gangrenous changes requiring complex medical care, IV antibiotics and possibly surgical intervention     Guilford Shi MD [facility] Pager in [place]  If 7PM-7AM, please contact night-coverage [email or web address]   05/10/2022, 7:48 PM

## 2022-05-10 NOTE — Telephone Encounter (Signed)
-----   Message from Criselda Peaches, DPM sent at 04/22/2022  7:12 AM EDT ----- Regarding: [place] Can you talk to the wound care center and see the status of her referral for hyperbarics? They may want to contact her husband directly to arrange

## 2022-05-10 NOTE — ED Provider Notes (Signed)
[facility] Provider Note   CSN: [id number] Arrival date & time: 05/10/22  1341     History  Chief Complaint  Patient presents with   Cellulitis    Carolyn Russo is a 71 y.o. female.  HPI Patient presents for concern of foot infection.  Her medical history includes HTN, T2DM, HLD, CAD, osteopenia, asthma, ESRD (on HD M, W, F), anemia, CHF, CVA.  She states that she has only recently started dialysis.  She continues to make a large volume of urine.  She is on fluid restriction.  Her last dialysis session was yesterday.  She has been experiencing nausea following her dialysis treatments and this has not changed.  She had her right great toe amputated 1 month ago.  She has had increased pain and warmth to her foot for the past 2 to 3 days.  Patient is accompanied by her caregiver who states that the appearance of the right foot has changed over the past 3 days.  She completed a course of antibiotics approximately 2 weeks ago.  She has not been on any antibiotics since then.  She denies any recent fevers or chills.    Home Medications Prior to Admission medications   Medication Sig Start Date End Date Taking? Authorizing Provider  acetaminophen (TYLENOL) 500 MG tablet Take 1,000 mg by mouth 2 (two) times daily as needed (leg and arm pain).   Yes [provider]  albuterol (VENTOLIN HFA) 108 (90 Base) MCG/ACT inhaler Inhale 2 puffs into the lungs every 4 (four) hours as needed for wheezing or shortness of breath. 04/13/22  Yes Minette Brine, FNP  Alcaftadine (LASTACAFT) 0.25 % SOLN Place 1 drop into both eyes daily as needed (itching).   Yes [provider]  ARTIFICIAL TEAR SOLUTION OP Place 1 drop into both eyes daily as needed (dry eyes).   Yes [provider]  carvedilol (COREG) 3.125 MG tablet Take 1 tablet (3.125 mg total) by mouth 2 (two) times daily with a meal. 11/01/21  Yes [name], MD  clopidogrel (PLAVIX) 75 MG  tablet TAKE 1 TABLET(75 MG) BY MOUTH DAILY 02/11/22  Yes [name], MD  diclofenac Sodium (VOLTAREN) 1 % GEL Apply 2 g topically 4 (four) times daily. To left wrist Patient taking differently: Apply 2 g topically daily as needed (pain). To left wrist 05/26/21  Yes Love, Ivan Anchors, PA-C  diphenhydrAMINE (BENADRYL) 2 % cream Apply 1 application. topically daily as needed for itching.   Yes [provider]  diphenhydrAMINE (BENADRYL) 25 MG tablet Take 25 mg by mouth at bedtime.   Yes [provider]  fluticasone (FLONASE) 50 MCG/ACT nasal spray Place 2 sprays into the nose daily as needed for allergies.    Yes [provider]  Insulin Aspart, w/Niacinamide, (FIASP) 100 UNIT/ML SOLN Inject 6-16 Units as directed 3 (three) times daily as needed (high blood sugar). Sliding scale 100-150=6 units 151-200=10 units 201-250=12 units 251-300=14 units 301-350=16 units Greater than 350 = 16 units   Yes [provider]  insulin glargine (LANTUS) 100 UNIT/ML Solostar Pen Inject 13 Units into the skin daily. Patient taking differently: Inject 12 Units into the skin at bedtime. 08/18/21  Yes Ghumman, Ramandeep, NP  levETIRAcetam (KEPPRA) 500 MG tablet TAKE 1 TABLET(500 MG) BY MOUTH AT BEDTIME 01/07/22  Yes Lovorn, Megan, MD  Lidocaine 4 % GEL Apply 1 Application topically daily as needed (pain).   Yes [provider]  losartan (COZAAR) 50 MG  tablet Take 50 mg by mouth daily. 01/21/22  Yes [provider]  melatonin 5 MG TABS Take 5 mg by mouth at bedtime.   Yes [provider]  methylphenidate (RITALIN) 5 MG tablet Take 1 tablet (5 mg total) by mouth 2 (two) times daily with breakfast and lunch. Due to stroke attention Patient taking differently: Take 5 mg by mouth daily. Due to stroke attention 03/04/22  Yes Lovorn, Jinny Blossom, MD  mometasone-formoterol (DULERA) 200-5 MCG/ACT AERO Inhale 2 puffs into the lungs 2 (two) times daily. 04/15/22  Yes [name], MD  ondansetron (ZOFRAN) 8 MG tablet Take 8 mg by mouth 2 (two) times daily as needed for refractory nausea / vomiting.   Yes [provider]  ondansetron (ZOFRAN-ODT) 4 MG disintegrating tablet Take 1 tablet (4 mg total) by mouth every 8 (eight) hours as needed for nausea or vomiting. 03/07/22  Yes Curatolo, Adam, DO  oxyCODONE (OXY IR/ROXICODONE) 5 MG immediate release tablet Take 1 tablet (5 mg total) by mouth every 4 (four) hours as needed for up to 7 days for severe pain. 05/09/22 05/16/22 Yes McDonald, Stephan Minister, DPM  pantoprazole (PROTONIX) 40 MG tablet Take 1 tablet (40 mg total) by mouth daily. 02/07/22  Yes [name], MD  rosuvastatin (CRESTOR) 20 MG tablet Take 1 tablet (20 mg total) by mouth daily. 08/18/21  Yes Ghumman, Ramandeep, NP  senna-docusate (SENOKOT-S) 8.6-50 MG tablet Take 2 tablets by mouth 2 (two) times daily. Patient taking differently: Take 1-2 tablets by mouth daily as needed for mild constipation or moderate constipation. 06/29/21  Yes Ghumman, Ramandeep, NP  sevelamer carbonate (RENVELA) 0.8 g PACK packet Take 0.8 g by mouth 3 (three) times daily with meals. 10/19/21  Yes [provider]  cefadroxil (DURICEF) 500 MG capsule Take 2 capsules (1,000 mg total) by mouth every Monday, Wednesday, and Friday with hemodialysis. 05/04/22   Criselda Peaches, DPM  Continuous Blood Gluc Receiver (FREESTYLE LIBRE 2 READER) DEVI by Does not apply route.    [provider]  hydrALAZINE (APRESOLINE) 10 MG tablet Take 1 tablet (10 mg total) by mouth every 8 (eight) hours as needed (Sys BP >180, hold on HD days). Patient not taking: Reported on 05/10/2022 05/26/21   Love, Ivan Anchors, PA-C  hydrocortisone (ANUSOL-HC) 2.5 % rectal cream Place 1 application rectally 2 (two) times daily as needed for hemorrhoids or anal itching. Patient not taking: Reported on 05/10/2022 04/20/21   Jonetta Osgood, MD  Insulin Pen Needle (BD PEN NEEDLE NANO 2ND GEN) 32G X 4 MM MISC See  admin instructions. 07/15/21   [provider]  iron sucrose in sodium chloride 0.9 % 100 mL Inject 100 mg into the vein as needed (based on labs). 06/16/21   [provider]  Methoxy PEG-Epoetin Beta (MIRCERA IJ) Mircera 07/21/21 07/20/22  [provider]  mupirocin ointment (BACTROBAN) 2 % Apply 1 application. topically 2 (two) times daily. Patient not taking: Reported on 05/10/2022 02/01/22   Criselda Peaches, DPM  Nutritional Supplements (,FEEDING SUPPLEMENT, PROSOURCE PLUS) liquid Take 30 mLs by mouth 2 (two) times daily between meals. Patient not taking: Reported on 04/14/2022 05/27/21   Love, Ivan Anchors, PA-C  OneTouch Delica Lancets 11H MISC Use as directed to check blood sugars before breakfast and dinner dx: e11.65 08/18/21   Bary Castilla, NP  traMADol (ULTRAM) 50 MG tablet TAKE 1 TABLET(50 MG) BY MOUTH EVERY 12 HOURS FOR 5 DAYS Patient not taking: Reported on 05/10/2022 03/28/22  Criselda Peaches, DPM  iron polysaccharides (NIFEREX) 150 MG capsule Take 1 capsule (150 mg total) by mouth daily. Patient not taking: No sig reported 03/24/21 04/16/21  Nita Sells, MD      Allergies    Food, Wheat bran, Statins, Pork-derived products, Shellfish allergy, Sitagliptin, Tetracycline, and Contrast media [iodinated contrast media]    Review of Systems   Review of Systems  Constitutional:  Positive for activity change and fatigue.  Gastrointestinal:  Positive for abdominal pain, constipation (Last BM yesterday) and nausea (Post HD).  Skin:  Positive for wound.  All other systems reviewed and are negative.   Physical Exam Updated Vital Signs BP (!) 169/74   Pulse 70   Temp 98.3 F (36.8 C) (Oral)   Resp 18   Ht 5' 2.5" (1.588 m)   Wt 82 kg   SpO2 96%   BMI 32.54 kg/m  Physical Exam Vitals and nursing note reviewed.  Constitutional:      General: She is not in acute distress.    Appearance: Normal appearance. She is well-developed. She is not  ill-appearing, toxic-appearing or diaphoretic.  HENT:     Head: Normocephalic and atraumatic.     Right Ear: External ear normal.     Left Ear: External ear normal.     Nose: Nose normal.     Mouth/Throat:     Mouth: Mucous membranes are moist.     Pharynx: Oropharynx is clear.  Eyes:     Extraocular Movements: Extraocular movements intact.     Conjunctiva/sclera: Conjunctivae normal.  Cardiovascular:     Rate and Rhythm: Normal rate and regular rhythm.     Heart sounds: No murmur heard. Pulmonary:     Effort: Pulmonary effort is normal. No respiratory distress.     Breath sounds: Normal breath sounds. No wheezing or rales.  Abdominal:     General: There is no distension.     Palpations: Abdomen is soft.     Tenderness: There is abdominal tenderness (Mild, epigastric).  Musculoskeletal:     Cervical back: Normal range of motion and neck supple.     Comments: Tenderness, warmth, and color change to area of recent right great toe amputation  Skin:    General: Skin is warm and dry.  Neurological:     General: No focal deficit present.     Mental Status: She is alert and oriented to person, place, and time.     Cranial Nerves: No cranial nerve deficit.     Sensory: No sensory deficit.     Motor: No weakness.     Coordination: Coordination normal.  Psychiatric:        Mood and Affect: Mood normal.        Behavior: Behavior normal.        Thought Content: Thought content normal.        Judgment: Judgment normal.        ED Results / Procedures / Treatments   Labs (all labs ordered are listed, but only abnormal results are displayed) Labs Reviewed  COMPREHENSIVE METABOLIC PANEL - Abnormal; Notable for the following components:      Result Value   Chloride 94 (*)    Glucose, Bld 136 (*)    BUN 32 (*)    Creatinine, Ser 7.26 (*)    Albumin 3.1 (*)    GFR, Estimated 6 (*)    All other components within normal limits  CBC WITH DIFFERENTIAL/PLATELET - Abnormal; Notable  for the following  components:   WBC 11.7 (*)    RBC 3.33 (*)    Hemoglobin 9.6 (*)    HCT 29.0 (*)    RDW 18.6 (*)    Monocytes Absolute 1.8 (*)    All other components within normal limits  SEDIMENTATION RATE - Abnormal; Notable for the following components:   Sed Rate 56 (*)    All other components within normal limits  C-REACTIVE PROTEIN - Abnormal; Notable for the following components:   CRP 9.4 (*)    All other components within normal limits  CULTURE, BLOOD (ROUTINE X 2)  CULTURE, BLOOD (ROUTINE X 2)  LACTIC ACID, PLASMA  LACTIC ACID, PLASMA  PROTIME-INR  BASIC METABOLIC PANEL  CBC    EKG None  Radiology DG Foot Complete Right  Result Date: 05/10/2022 CLINICAL DATA:  Partial amputation 1 month ago, pain and warmth EXAM: RIGHT FOOT COMPLETE - 3+ VIEW COMPARISON:  None Available. FINDINGS: Previous amputation across the first metatarsal, with overlying skin staples. There are some subcutaneous gas bubbles just distal to the amputation in the soft tissues. No convincing cortical loss to indicate osteomyelitis. No fracture or dislocation. Diffuse osteopenia. Small calcaneal spur. Extensive arterial calcifications in the foot. IMPRESSION: 1. Previous amputation across the first metatarsal. Regional subcutaneous gas bubbles conceivably postoperative although infection cannot be entirely excluded. 2. No radiographic evidence of osteomyelitis. Electronically Signed   By: Lucrezia Europe M.D.   On: 05/10/2022 16:56    Procedures Procedures    Medications Ordered in ED Medications  vancomycin (VANCOREADY) IVPB 1750 mg/350 mL (1,750 mg Intravenous New Bag/Given 05/10/22 1843)  vancomycin (VANCOCIN) IVPB 1000 mg/200 mL premix (has no administration in time range)  ceFEPIme (MAXIPIME) 1 g in sodium chloride 0.9 % 100 mL IVPB (has no administration in time range)  fentaNYL (SUBLIMAZE) injection 25 mcg (has no administration in time range)  acetaminophen (TYLENOL) tablet 650 mg (has no  administration in time range)  oxyCODONE (Oxy IR/ROXICODONE) immediate release tablet 5 mg (has no administration in time range)  carvedilol (COREG) tablet 3.125 mg (has no administration in time range)  hydrALAZINE (APRESOLINE) tablet 10 mg (has no administration in time range)  losartan (COZAAR) tablet 50 mg (50 mg Oral Given 05/10/22 1849)  rosuvastatin (CRESTOR) tablet 20 mg (20 mg Oral Given 05/10/22 1849)  methylphenidate (RITALIN) tablet 5 mg (has no administration in time range)  ondansetron (ZOFRAN) tablet 8 mg (has no administration in time range)  pantoprazole (PROTONIX) EC tablet 40 mg (has no administration in time range)  senna-docusate (Senokot-S) tablet 1-2 tablet (has no administration in time range)  sevelamer carbonate (RENVELA) packet 0.8 g (has no administration in time range)  clopidogrel (PLAVIX) tablet 75 mg (has no administration in time range)  melatonin tablet 5 mg (has no administration in time range)  levETIRAcetam (KEPPRA) tablet 500 mg (has no administration in time range)  diphenhydrAMINE (BENADRYL) tablet 25 mg (has no administration in time range)  fluticasone (FLONASE) 50 MCG/ACT nasal spray 2 spray (has no administration in time range)  mometasone-formoterol (DULERA) 200-5 MCG/ACT inhaler 2 puff (has no administration in time range)  0.9 %  sodium chloride infusion ( Intravenous New Bag/Given 05/10/22 1849)  fentaNYL (SUBLIMAZE) injection 12.5-50 mcg (has no administration in time range)  albuterol (PROVENTIL) (2.5 MG/3ML) 0.083% nebulizer solution 2.5 mg (has no administration in time range)  hydrALAZINE (APRESOLINE) injection 10 mg (has no administration in time range)  insulin aspart (novoLOG) injection 0-6 Units (has no administration in time range)  insulin glargine-yfgn (SEMGLEE) injection 8 Units (has no administration in time range)  oxyCODONE (Oxy IR/ROXICODONE) immediate release tablet 5 mg (5 mg Oral Given 05/10/22 1624)  ceFEPIme (MAXIPIME) 2 g in  sodium chloride 0.9 % 100 mL IVPB (0 g Intravenous Stopped 05/10/22 1840)    ED Course/ Medical Decision Making/ A&P                           Medical Decision Making Amount and/or Complexity of Data Reviewed Labs: ordered. Radiology: ordered.  Risk Prescription drug management. Decision regarding hospitalization.   This patient presents to the ED for concern of right foot wound infection, this involves an extensive number of treatment options, and is a complaint that carries with it a high risk of complications and morbidity.  The differential diagnosis includes dry gangrene, osteomyelitis, cellulitis, and necrotizing infection   Co morbidities that complicate the patient evaluation  HTN, T2DM, HLD, CAD, osteopenia, asthma, ESRD (on HD M, W, F), anemia, CHF, CVA   Additional history obtained:  Additional history obtained from patient's caregiver External records from outside source obtained and reviewed including EMR   Lab Tests:  I Ordered, and personally interpreted labs.  The pertinent results include: Mild leukocytosis is present.  Lactic acid is normal.   Imaging Studies ordered:  I ordered imaging studies including x-ray of right foot, MRI of right foot I independently visualized and interpreted imaging which showed: X-rays showed subcutaneous gas bubbles, postoperative versus infection.  No evidence of osteomyelitis.  MRI study pending at time of admission. I agree with the radiologist interpretation   Cardiac Monitoring: / EKG:  The patient was maintained on a cardiac monitor.  I personally viewed and interpreted the cardiac monitored which showed an underlying rhythm of: Sinus rhythm   Consultations Obtained:  I requested consultation with the podiatrist, Dr. Jacqualyn Posey,  and discussed lab and imaging findings as well as pertinent plan - they recommend: MRI of foot, antibiotics, and admission to hospitalist.  Dr. Jacqualyn Posey will come see the patient in the  ED.   Problem List / ED Course / Critical interventions / Medication management  Patient is a 71 year old female presenting from home for concern of worsening right foot wound.  She had a gangrenous infection to her right great toe and underwent an amputation 3 to 4 weeks ago.  Over the past 3 days, she has had increased pain at the site.  Appearance of wound has changed and now shows worsening dry gangrene.  Patient is otherwise well-appearing on exam.  Breathing is unlabored.  She endorses recent nausea that simply occurs after her dialysis.  This has not changed.  She denies any fevers or chills.  Laboratory work-up was initiated prior to being bedded in the ED.  Patient does have a leukocytosis.  Her lactic acid is normal.  Roxicodone was ordered for analgesia.  I spoke with podiatrist on-call, Dr. Jacqualyn Posey, who states that he will come see the patient in the ED.  In the meantime, he advises initiation of antibiotics, MRI of foot, and admission to hospital for likely surgical management.  Patient was admitted for ongoing care. I ordered medication including oxycodone for analgesia; antibiotics for foot infection Reevaluation of the patient after these medicines showed that the patient improved I have reviewed the patients home medicines and have made adjustments as needed   Social Determinants of Health:  Has access to outpatient care  Final Clinical Impression(s) / ED Diagnoses Final diagnoses:  Gangrene of right foot [facility])    Rx / DC Orders ED Discharge Orders     None         Godfrey Pick, MD 05/10/22 1900

## 2022-05-10 NOTE — Consult Note (Signed)
Reason for Consult:Ulcer, infection Referring Physician: DR. Godfrey Pick, MD  Carolyn Russo is an 71 y.o. female.  HPI: 71 year old female with past medical history significant for hypertension, type 2 diabetes, hyperlipidemia, CAD, end-stage renal disease on dialysis Monday Wednesday Friday, anemia, CHF, CVA was brought to the emergency room today for concerns of change in appearance of wound on the right foot over the last 3 days.  She previously has undergone surgery with Dr. Ericka Pontiff due to gangrene of the right hallux in which partial first amputation was performed.  She was last discharged in the hospital on April 17, 2022.  On Feb 21, 2022 she underwent revascularization with Dr. Servando Snare.  She underwent laser arthrectomy right posterior tibial artery, stent left posterior tibial artery.  She has been started on cefepime, vancomycin  Past Medical History:  Diagnosis Date   Acute GI bleeding    Allergy    Anemia    Anterior chest wall pain    Appendicitis 1965   Asthma    Body mass index 37.0-37.9, adult    Breast pain    Cataract    both eyes   CHF (congestive heart failure) ([place])    Chronic kidney disease    stage 5 - on dialysis   Cognitive change 04/20/2021   r/t cva 03/2951   Complication of anesthesia    Memory loss after general   Dehydration 2014   Deviated septum 1971   Diabetes mellitus    Dysphagia due to old stroke    easy to get strangled when eating   Dyspnea 2014   Extrinsic asthma    WITH ASTHMA ATTACK   Fibroid 1980   GERD (gastroesophageal reflux disease)    Heart murmur    History of migraine    History of seizure    with stroke   Hx gestational diabetes    Hyperlipidemia    Hypertension 2014   Inguinal hernia 1959   Malaise and fatigue 2014   Non-IgE mediated allergic asthma 2014   Obesity    Pelvic pain    Pregnancy, high-risk 1985   Stroke ([place]) 04/20/2021   (CVA) of right basal ganglia   Tonsillitis 1968   Uterine fibroid 1980    Visual field defect    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    Past Surgical History:  Procedure Laterality Date   ABDOMINAL AORTOGRAM W/LOWER EXTREMITY N/A 02/21/2022   Procedure: ABDOMINAL AORTOGRAM W/LOWER EXTREMITY;  Surgeon: Waynetta Sandy, MD;  Location: [facility];  Service: Cardiovascular;  Laterality: N/A;   AMPUTATION TOE Right 04/15/2022   Procedure: AMPUTATION  LST TOERIGHT FOOT;  Surgeon: Criselda Peaches, DPM;  Location: [facility];  Service: Podiatry;  Laterality: Right;   APPENDECTOMY  8413   BASCILIC VEIN TRANSPOSITION Right 04/30/2021   Procedure: RIGHT FIRST STAGE [place];  Surgeon: Angelia Mould, MD;  Location: [facility];  Service: Vascular;  Laterality: Right;   CESAREAN SECTION  1985   COLONOSCOPY     ESOPHAGOGASTRODUODENOSCOPY (EGD) WITH PROPOFOL N/A 04/18/2021   Procedure: ESOPHAGOGASTRODUODENOSCOPY (EGD) WITH PROPOFOL;  Surgeon: Gatha Mayer, MD;  Location: [facility];  Service: Endoscopy;  Laterality: N/A;   EYE SURGERY     bilateral cataract    FLEXIBLE SIGMOIDOSCOPY N/A 04/18/2021   Procedure: FLEXIBLE SIGMOIDOSCOPY;  Surgeon: Gatha Mayer, MD;  Location: [facility];  Service: Endoscopy;  Laterality: N/A;   HERNIA REPAIR  1959   IR FLUORO GUIDE CV LINE RIGHT  03/18/2021   IR US GUIDE VASC ACCESS RIGHT  03/18/2021   LEFT HEART CATH AND CORONARY ANGIOGRAPHY N/A 04/04/2017   Procedure: Left Heart Cath and Coronary Angiography;  Surgeon: Belva Crome, MD;  Location: [facility];  Service: Cardiovascular;  Laterality: N/A;   [place], 2004, 2007   PERIPHERAL VASCULAR THROMBECTOMY  02/21/2022   Procedure: PERIPHERAL VASCULAR THROMBECTOMY;  Surgeon: Waynetta Sandy, MD;  Location: [facility];  Service: Cardiovascular;;  Right Tibial   RHINOPLASTY  1971   ROTATOR CUFF REPAIR  2003   SURGICAL REPAIR OF HEMORRHAGE  2015   TONSILLECTOMY  1968    Family History  Problem Relation Age of Onset   Cancer  Mother        abdominal melamona   Psoriasis Mother    Alzheimer's disease Father    Cancer Cousin        colon    Diabetes Cousin    Diabetes Maternal Aunt    Colon cancer Neg Hx    Colon polyps Neg Hx    Esophageal cancer Neg Hx    Rectal cancer Neg Hx    Stomach cancer Neg Hx    Breast cancer Neg Hx     Social History:  reports that she has never smoked. She has never used smokeless tobacco. She reports that she does not drink alcohol and does not use drugs.  Allergies:  Allergies  Allergen Reactions   Food Anaphylaxis    Peanuts; Almonds   Wheat Bran     Upset stomach    Statins Itching and Other (See Comments)    Generalized aches- tolerates crestor    Pork-Derived Products     Does not eat pork    Shellfish Allergy Other (See Comments)    Mouth gets raw   Sitagliptin Other (See Comments)    Januiva - unknown reaction    Tetracycline Other (See Comments)    Raw mouth   Contrast Media [Iodinated Contrast Media] Rash    Happened during CT scan over 30 years ago    Medications: I have reviewed the patient's current medications.  Results for orders placed or performed during the hospital encounter of 05/10/22 (from the past 48 hour(s))  Comprehensive metabolic panel     Status: Abnormal   Collection Time: 05/10/22  2:42 PM  Result Value Ref Range   Sodium 136 135 - 145 mmol/L   Potassium 4.3 3.5 - 5.1 mmol/L   Chloride 94 (L) 98 - 111 mmol/L   CO2 28 22 - 32 mmol/L   Glucose, Bld 136 (H) 70 - 99 mg/dL    Comment: Glucose reference range applies only to samples taken after fasting for at least 8 hours.   BUN 32 (H) 8 - 23 mg/dL   Creatinine, Ser 7.26 (H) 0.44 - 1.00 mg/dL   Calcium 9.4 8.9 - 10.3 mg/dL   Total Protein 7.6 6.5 - 8.1 g/dL   Albumin 3.1 (L) 3.5 - 5.0 g/dL   AST 28 15 - 41 U/L   ALT 21 0 - 44 U/L   Alkaline Phosphatase 57 38 - 126 U/L   Total Bilirubin 0.9 0.3 - 1.2 mg/dL   GFR, Estimated 6 (L) >60 mL/min    Comment: (NOTE) Calculated using  the CKD-EPI Creatinine Equation (2021)    Anion gap 14 5 - 15    Comment: Performed at [facility] [address] [place],  [address]  Lactic acid,  plasma     Status: None   Collection Time: 05/10/22  2:42 PM  Result Value Ref Range   Lactic Acid, Venous 0.9 0.5 - 1.9 mmol/L    Comment: Performed at [facility], [place] [address], [place], [place] [address]  CBC with Differential     Status: Abnormal   Collection Time: 05/10/22  2:42 PM  Result Value Ref Range   WBC 11.7 (H) 4.0 - 10.5 K/uL   RBC 3.33 (L) 3.87 - 5.11 MIL/uL   Hemoglobin 9.6 (L) 12.0 - 15.0 g/dL   HCT 29.0 (L) 36.0 - 46.0 %   MCV 87.1 80.0 - 100.0 fL   MCH 28.8 26.0 - 34.0 pg   MCHC 33.1 30.0 - 36.0 g/dL   RDW 18.6 (H) 11.5 - 15.5 %   Platelets 354 150 - 400 K/uL   nRBC 0.0 0.0 - 0.2 %   Neutrophils Relative % 58 %   Neutro Abs 6.8 1.7 - 7.7 K/uL   Lymphocytes Relative 22 %   Lymphs Abs 2.5 0.7 - 4.0 K/uL   Monocytes Relative 15 %   Monocytes Absolute 1.8 (H) 0.1 - 1.0 K/uL   Eosinophils Relative 4 %   Eosinophils Absolute 0.4 0.0 - 0.5 K/uL   Basophils Relative 1 %   Basophils Absolute 0.1 0.0 - 0.1 K/uL   Immature Granulocytes 0 %   Abs Immature Granulocytes 0.05 0.00 - 0.07 K/uL    Comment: Performed at [facility], [place] [address], [place], [place] [address]  Protime-INR     Status: None   Collection Time: 05/10/22  2:42 PM  Result Value Ref Range   Prothrombin Time 15.1 11.4 - 15.2 seconds   INR 1.2 0.8 - 1.2    Comment: (NOTE) INR goal varies based on device and disease states. Performed at [facility], [place] [address], [place], [name] [address]   Lactic acid, plasma     Status: None   Collection Time: 05/10/22  3:46 PM  Result Value Ref Range   Lactic Acid, Venous 1.0 0.5 - 1.9 mmol/L    Comment: Performed at [facility] [address] [place], [place] [address]  Sedimentation rate     Status: Abnormal   Collection Time: 05/10/22  4:30 PM  Result  Value Ref Range   Sed Rate 56 (H) 0 - 22 mm/hr    Comment: Performed at [facility] [address], [place], [place] [address]  C-reactive protein     Status: Abnormal   Collection Time: 05/10/22  4:30 PM  Result Value Ref Range   CRP 9.4 (H) <1.0 mg/dL    Comment: Performed at [facility] [address], [place], [place] [address]    DG Foot Complete Right  Result Date: 05/10/2022 CLINICAL DATA:  Partial amputation 1 month ago, pain and warmth EXAM: RIGHT FOOT COMPLETE - 3+ VIEW COMPARISON:  None Available. FINDINGS: Previous amputation across the first metatarsal, with overlying skin staples. There are some subcutaneous gas bubbles just distal to the amputation in the soft tissues. No convincing cortical loss to indicate osteomyelitis. No fracture or dislocation. Diffuse osteopenia. Small calcaneal spur. Extensive arterial calcifications in the foot. IMPRESSION: 1. Previous amputation across the first metatarsal. Regional subcutaneous gas bubbles conceivably postoperative although infection cannot be entirely excluded. 2. No radiographic evidence of osteomyelitis. Electronically Signed   By: Lucrezia Europe M.D.   On: 05/10/2022 16:56  Review of Systems Blood pressure (!) 169/74, pulse 70, temperature 98.3 F (36.8 C), temperature source Oral, resp. rate 18, height 5' 2.5" (1.588 m), weight 82 kg, SpO2 96 %. Physical Exam General: NAD  Dermatological: On the right foot is gangrenous changes present along the area of the first ray amputation site.  On the plantar flap the area is cool to touch.  There is also irritation is present at the fifth toe.  Hyperpigmented changes to the foot.  Hyperpigmented changes to the left third toe as well         Vascular: Pulses decreased  Neruologic: Sensation decreased  Musculoskeletal: Pain to the right foot.  Gait: Unassisted, Nonantalgic.   Assessment/Plan: Status post right partial first amputation with necrosis, gangrenous  changes; left third toe hyperpigmentation  -X-ray independently reviewed.  Previous amputation of first metatarsal.  No radiographic evidence of osteomyelitis.  Some subcutaneous air present likely postoperative. -Blood work reviewed.  White count elevated at 11.7, lactic acid 1, CRP 9.4 -Recommend MRI -Recommend touching base with vascular surgery -I discussed with the patient's husband at bedside and sons over the phone the plan.  We will like to get MRI as well as vascular surgery evaluation.  Likely proceed with further surgery which is different options but with the above results before proceeding.  Trula Slade 05/10/2022, 6:58 PM

## 2022-05-10 NOTE — Progress Notes (Signed)
Pharmacy Antibiotic Note  Carolyn Russo is a 71 y.o. female admitted on 05/10/2022 presenting with R-foot infection.  Pharmacy has been consulted for vancomycin and cefepime dosing.  ESRD-HD usually MWF  Plan: Vancomycin 1750 mg IV x 1, then 1000 mg IV qHD Cefepime 2g IV x 1, then 1g IV q 24h Monitor HD schedule, Cx/intervention recs to narrow Vancomycin random level as needed  Height: 5' 2.5" (158.8 cm) Weight: 82 kg (180 lb 12.4 oz) IBW/kg (Calculated) : 51.25  Temp (24hrs), Avg:98.7 F (37.1 C), Min:98.7 F (37.1 C), Max:98.7 F (37.1 C)  Recent Labs  Lab 05/10/22 1442 05/10/22 1546  WBC 11.7*  --   CREATININE 7.26*  --   LATICACIDVEN 0.9 1.0    Estimated Creatinine Clearance: 6.9 mL/min (A) (by C-G formula based on SCr of 7.26 mg/dL (H)).    Allergies  Allergen Reactions   Food Anaphylaxis    Peanuts; Almonds   Wheat Bran     Upset stomach    Statins Itching and Other (See Comments)    Generalized aches- tolerates crestor    Pork-Derived Products     Does not eat pork    Shellfish Allergy Other (See Comments)    Mouth gets raw   Sitagliptin Other (See Comments)    Januiva - unknown reaction    Tetracycline Other (See Comments)    Raw mouth   Contrast Media [Iodinated Contrast Media] Rash    Happened during CT scan over 30 years ago    Bertis Ruddy, PharmD Clinical Pharmacist ED Pharmacist Phone # [phone] 05/10/2022 5:11 PM

## 2022-05-10 NOTE — Telephone Encounter (Signed)
Pt's husband sent Mychart msg with concerns about wife's foot and leg following a podiatry appt.  Spoke with PA, reviewed pt's chart, called husband, two identifiers used. He is requesting another re-evaluation since the pt's wounds have worsened and she is having some leg pain. He may want a second podiatry opinion. PA advised to make appt with provider. Appt made with pt's HD treatment schedule in mind. Confirmed understanding.

## 2022-05-11 ENCOUNTER — Ambulatory Visit: Payer: Medicare PPO | Admitting: Vascular Surgery

## 2022-05-11 ENCOUNTER — Inpatient Hospital Stay (HOSPITAL_COMMUNITY): Payer: Medicare PPO

## 2022-05-11 DIAGNOSIS — I96 Gangrene, not elsewhere classified: Secondary | ICD-10-CM

## 2022-05-11 DIAGNOSIS — L089 Local infection of the skin and subcutaneous tissue, unspecified: Secondary | ICD-10-CM | POA: Diagnosis not present

## 2022-05-11 DIAGNOSIS — T8781 Dehiscence of amputation stump: Secondary | ICD-10-CM

## 2022-05-11 LAB — BASIC METABOLIC PANEL
Anion gap: 11 (ref 5–15)
BUN: 33 mg/dL — ABNORMAL HIGH (ref 8–23)
CO2: 26 mmol/L (ref 22–32)
Calcium: 8.8 mg/dL — ABNORMAL LOW (ref 8.9–10.3)
Chloride: 100 mmol/L (ref 98–111)
Creatinine, Ser: 7.6 mg/dL — ABNORMAL HIGH (ref 0.44–1.00)
GFR, Estimated: 5 mL/min — ABNORMAL LOW (ref >60)
Glucose, Bld: 101 mg/dL — ABNORMAL HIGH (ref 70–99)
Potassium: 4.5 mmol/L (ref 3.5–5.1)
Sodium: 137 mmol/L (ref 135–145)

## 2022-05-11 LAB — CBC
HCT: 26.3 % — ABNORMAL LOW (ref 36.0–46.0)
Hemoglobin: 8.9 g/dL — ABNORMAL LOW (ref 12.0–15.0)
MCH: 29.6 pg (ref 26.0–34.0)
MCHC: 33.8 g/dL (ref 30.0–36.0)
MCV: 87.4 fL (ref 80.0–100.0)
Platelets: 338 10*3/uL (ref 150–400)
RBC: 3.01 MIL/uL — ABNORMAL LOW (ref 3.87–5.11)
RDW: 18.8 % — ABNORMAL HIGH (ref 11.5–15.5)
WBC: 11.7 10*3/uL — ABNORMAL HIGH (ref 4.0–10.5)
nRBC: 0 % (ref 0.0–0.2)

## 2022-05-11 LAB — CBG MONITORING, ED: Glucose-Capillary: 73 mg/dL (ref 70–99)

## 2022-05-11 LAB — GLUCOSE, CAPILLARY
Glucose-Capillary: 107 mg/dL — ABNORMAL HIGH (ref 70–99)
Glucose-Capillary: 112 mg/dL — ABNORMAL HIGH (ref 70–99)

## 2022-05-11 LAB — HEPATITIS B SURFACE ANTIGEN: Hepatitis B Surface Ag: NONREACTIVE

## 2022-05-11 LAB — HEPATITIS B SURFACE ANTIBODY,QUALITATIVE: Hep B S Ab: NONREACTIVE

## 2022-05-11 LAB — HEPATITIS B CORE ANTIBODY, TOTAL: Hep B Core Total Ab: NONREACTIVE

## 2022-05-11 LAB — HEPATITIS C ANTIBODY: HCV Ab: NONREACTIVE

## 2022-05-11 MED ORDER — HEPARIN SODIUM (PORCINE) 1000 UNIT/ML IJ SOLN
INTRAMUSCULAR | Status: AC
  Administered 2022-05-11: 3200 [IU] via INTRAVENOUS_CENTRAL
  Filled 2022-05-11: qty 4

## 2022-05-11 MED ORDER — LIDOCAINE HCL (PF) 1 % IJ SOLN: 5.0000 mL | INTRAMUSCULAR | Status: DC | PRN

## 2022-05-11 MED ORDER — ALTEPLASE 2 MG IJ SOLR: 2.0000 mg | Freq: Once | INTRAMUSCULAR | Status: DC | PRN

## 2022-05-11 MED ORDER — PENTAFLUOROPROP-TETRAFLUOROETH EX AERO
1.0000 | INHALATION_SPRAY | CUTANEOUS | Status: DC | PRN
  Filled 2022-05-11: qty 116

## 2022-05-11 MED ORDER — CHLORHEXIDINE GLUCONATE CLOTH 2 % EX PADS
6.0000 | MEDICATED_PAD | Freq: Every day | CUTANEOUS | Status: DC
  Administered 2022-05-12 – 2022-05-23 (×11): 6 via TOPICAL

## 2022-05-11 MED ORDER — OXYCODONE-ACETAMINOPHEN 7.5-325 MG PO TABS
1.0000 | ORAL_TABLET | ORAL | Status: DC | PRN
  Filled 2022-05-11: qty 1

## 2022-05-11 MED ORDER — OXYCODONE HCL 5 MG PO TABS
5.0000 mg | ORAL_TABLET | ORAL | Status: DC | PRN
  Administered 2022-05-12: 5 mg via ORAL
  Filled 2022-05-11: qty 1

## 2022-05-11 MED ORDER — DIPHENHYDRAMINE HCL 25 MG PO CAPS
25.0000 mg | ORAL_CAPSULE | Freq: Every day | ORAL | Status: DC
  Administered 2022-05-11 – 2022-05-15 (×5): 25 mg via ORAL
  Filled 2022-05-11 (×5): qty 1

## 2022-05-11 MED ORDER — LIDOCAINE-PRILOCAINE 2.5-2.5 % EX CREA
1.0000 | TOPICAL_CREAM | CUTANEOUS | Status: DC | PRN
  Filled 2022-05-11: qty 5

## 2022-05-11 NOTE — Procedures (Signed)
HD Note The data gathered during the dialysis treatment were documented with the correct time listed.  Some data was entered later that the time is was gathered.

## 2022-05-11 NOTE — Progress Notes (Signed)
Subjective: 71 year old female seen today in dialysis.  She does report some pain to her foot.  Objective: AAO x3, NAD Extensive necrosis present on the dorsal and plantar aspect of the medial aspect of the foot.  Bandages present to the fifth toe.  Hyperpigmentation extending proximal to the midfoot area.  There is no crepitation there is no significant purulence. No pain with calf compression, swelling, warmth, erythema  Assessment: Gangrenous changes right foot  Plan: Given the gangrenous changes as well as PAD I discussed some treatment options for the patient.  She does need to have further surgery given the changes.  I discussed with her possibly midfoot amputation and grafting however I doubt this is going to heal or be functional.  I do think her best option is a below-knee amputation at this time.  Patient seemed to be amendable to this.  I also contacted the patient's husband and we discussed the options.  The patient's husband seems to be agreeable to this as well but would have to get the arterial study, ultrasound prior.  We will likely plan for proximal amputation with vascular surgery.  I will will continue to follow for now.  Celesta Gentile, DPM

## 2022-05-11 NOTE — Consult Note (Signed)
[facility] Renal Consultation Note    Indication for Consultation:  Management of ESRD/hemodialysis; anemia, hypertension/volume and secondary hyperparathyroidism  QMG:NOIBBCW, Bailey Mech, MD  HPI: Carolyn Russo is a 71 y.o. female with ESRD on HD MWF at [facility]. Her past medical history is significant for hypertension, diastolic HF, aspiration pna, asthma, bradycardia, HLD, type 2 DM, CAD, CVA, PAD s/p recent stent (on Plavix). Patient presents to ED c/o increased right foot swelling, pain, and discoloration. Patient followed by Podiatry and [facility]VS outpatient and both on board here. Noted s/p R great toe amputation 04/15/22 by Dr. Sherryle Lis. Also underwent angiogram with laser atherectomy of R PTA and stent of left PTA on 02/21/22 by Dr. Carlis Abbott. Noted LE duplex and IV ABXs ordered for concern of worsening R foot infection. Blood cultures were sent.  Seen and examined patient at bedside. Patient's husband also at bedside. Reports mild SOB and O2 was applied. She denies CP, ABD pain, and N/V. Her last HD was on 05/09/22 and received full treatment. Labs include: Lactic acid 9.4, SrCr 7.6, BUN 33, K+ 4.5, Ca 8.8, Na 137, and Hgb 8.9. Plan for HD later this afternoon or evening per her routine schedule.  Past Medical History:  Diagnosis Date   Acute GI bleeding    Allergy    Anemia    Anterior chest wall pain    Appendicitis 1965   Asthma    Body mass index 37.0-37.9, adult    Breast pain    Cataract    both eyes   CHF (congestive heart failure) ([place])    Chronic kidney disease    stage 5 - on dialysis   Cognitive change 04/20/2021   r/t cva 05/8890   Complication of anesthesia    Memory loss after general   Dehydration 2014   Deviated septum 1971   Diabetes mellitus    Dysphagia due to old stroke    easy to get strangled when eating   Dyspnea 2014   Extrinsic asthma    WITH ASTHMA ATTACK   Fibroid 1980   GERD (gastroesophageal reflux disease)    Heart murmur     History of migraine    History of seizure    with stroke   Hx gestational diabetes    Hyperlipidemia    Hypertension 2014   Inguinal hernia 1959   Malaise and fatigue 2014   Non-IgE mediated allergic asthma 2014   Obesity    Pelvic pain    Pregnancy, high-risk 1985   Stroke ([place]) 04/20/2021   (CVA) of right basal ganglia   Tonsillitis 1968   Uterine fibroid 1980   Visual field defect    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   Past Surgical History:  Procedure Laterality Date   ABDOMINAL AORTOGRAM W/LOWER EXTREMITY N/A 02/21/2022   Procedure: ABDOMINAL AORTOGRAM W/LOWER EXTREMITY;  Surgeon: Waynetta Sandy, MD;  Location: [facility];  Service: Cardiovascular;  Laterality: N/A;   AMPUTATION TOE Right 04/15/2022   Procedure: AMPUTATION  LST TOERIGHT FOOT;  Surgeon: Criselda Peaches, DPM;  Location: [facility];  Service: Podiatry;  Laterality: Right;   APPENDECTOMY  6945   BASCILIC VEIN TRANSPOSITION Right 04/30/2021   Procedure: RIGHT FIRST STAGE [place];  Surgeon: Angelia Mould, MD;  Location: [place];  Service: Vascular;  Laterality: Right;   CESAREAN SECTION  1985   COLONOSCOPY     ESOPHAGOGASTRODUODENOSCOPY (EGD) WITH PROPOFOL N/A 04/18/2021   Procedure: ESOPHAGOGASTRODUODENOSCOPY (EGD) WITH PROPOFOL;  Surgeon:  Gatha Mayer, MD;  Location: [facility] ENDOSCOPY;  Service: Endoscopy;  Laterality: N/A;   EYE SURGERY     bilateral cataract    FLEXIBLE SIGMOIDOSCOPY N/A 04/18/2021   Procedure: FLEXIBLE SIGMOIDOSCOPY;  Surgeon: Gatha Mayer, MD;  Location: [facility] ENDOSCOPY;  Service: Endoscopy;  Laterality: N/A;   HERNIA REPAIR  1959   IR FLUORO GUIDE CV LINE RIGHT  03/18/2021   IR US GUIDE VASC ACCESS RIGHT  03/18/2021   LEFT HEART CATH AND CORONARY ANGIOGRAPHY N/A 04/04/2017   Procedure: Left Heart Cath and Coronary Angiography;  Surgeon: Belva Crome, MD;  Location: [facility];  Service: Cardiovascular;  Laterality: N/A;   [place], 2004, 2007    PERIPHERAL VASCULAR THROMBECTOMY  02/21/2022   Procedure: PERIPHERAL VASCULAR THROMBECTOMY;  Surgeon: Waynetta Sandy, MD;  Location: [facility];  Service: Cardiovascular;;  Right Tibial   RHINOPLASTY  1971   ROTATOR CUFF REPAIR  2003   SURGICAL REPAIR OF HEMORRHAGE  2015   TONSILLECTOMY  1968   Family History  Problem Relation Age of Onset   Cancer Mother        abdominal melamona   Psoriasis Mother    Alzheimer's disease Father    Cancer Cousin        colon    Diabetes Cousin    Diabetes Maternal Aunt    Colon cancer Neg Hx    Colon polyps Neg Hx    Esophageal cancer Neg Hx    Rectal cancer Neg Hx    Stomach cancer Neg Hx    Breast cancer Neg Hx    Social History:  reports that she has never smoked. She has never used smokeless tobacco. She reports that she does not drink alcohol and does not use drugs. Allergies  Allergen Reactions   Food Anaphylaxis    Peanuts; Almonds   Wheat Bran     Upset stomach    Statins Itching and Other (See Comments)    Generalized aches- tolerates crestor    Pork-Derived Products     Does not eat pork    Shellfish Allergy Other (See Comments)    Mouth gets raw   Sitagliptin Other (See Comments)    Januiva - unknown reaction    Tetracycline Other (See Comments)    Raw mouth   Contrast Media [Iodinated Contrast Media] Rash    Happened during CT scan over 30 years ago   Prior to Admission medications   Medication Sig Start Date End Date Taking? Authorizing Provider  acetaminophen (TYLENOL) 500 MG tablet Take 1,000 mg by mouth 2 (two) times daily as needed (leg and arm pain).   Yes [provider]  albuterol (VENTOLIN HFA) 108 (90 Base) MCG/ACT inhaler Inhale 2 puffs into the lungs every 4 (four) hours as needed for wheezing or shortness of breath. 04/13/22  Yes Minette Brine, FNP  Alcaftadine (LASTACAFT) 0.25 % SOLN Place 1 drop into both eyes daily as needed (itching).   Yes [provider]  ARTIFICIAL  TEAR SOLUTION OP Place 1 drop into both eyes daily as needed (dry eyes).   Yes [provider]  carvedilol (COREG) 3.125 MG tablet Take 1 tablet (3.125 mg total) by mouth 2 (two) times daily with a meal. 11/01/21  Yes [name], MD  clopidogrel (PLAVIX) 75 MG tablet TAKE 1 TABLET(75 MG) BY MOUTH DAILY 02/11/22  Yes [name], MD  diclofenac Sodium (VOLTAREN) 1 % GEL Apply 2 g topically 4 (four)  times daily. To left wrist Patient taking differently: Apply 2 g topically daily as needed (pain). To left wrist 05/26/21  Yes Love, Ivan Anchors, PA-C  diphenhydrAMINE (BENADRYL) 2 % cream Apply 1 application. topically daily as needed for itching.   Yes [provider]  diphenhydrAMINE (BENADRYL) 25 MG tablet Take 25 mg by mouth at bedtime.   Yes [provider]  fluticasone (FLONASE) 50 MCG/ACT nasal spray Place 2 sprays into the nose daily as needed for allergies.    Yes [provider]  Insulin Aspart, w/Niacinamide, (FIASP) 100 UNIT/ML SOLN Inject 6-16 Units as directed 3 (three) times daily as needed (high blood sugar). Sliding scale 100-150=6 units 151-200=10 units 201-250=12 units 251-300=14 units 301-350=16 units Greater than 350 = 16 units   Yes [provider]  insulin glargine (LANTUS) 100 UNIT/ML Solostar Pen Inject 13 Units into the skin daily. Patient taking differently: Inject 12 Units into the skin at bedtime. 08/18/21  Yes Ghumman, Ramandeep, NP  levETIRAcetam (KEPPRA) 500 MG tablet TAKE 1 TABLET(500 MG) BY MOUTH AT BEDTIME 01/07/22  Yes Lovorn, Megan, MD  Lidocaine 4 % GEL Apply 1 Application topically daily as needed (pain).   Yes [provider]  losartan (COZAAR) 50 MG tablet Take 50 mg by mouth daily. 01/21/22  Yes [provider]  melatonin 5 MG TABS Take 5 mg by mouth at bedtime.   Yes [provider]  methylphenidate (RITALIN) 5 MG tablet Take 1 tablet (5 mg total) by mouth 2 (two) times daily with breakfast  and lunch. Due to stroke attention Patient taking differently: Take 5 mg by mouth daily. Due to stroke attention 03/04/22  Yes Lovorn, Jinny Blossom, MD  mometasone-formoterol (DULERA) 200-5 MCG/ACT AERO Inhale 2 puffs into the lungs 2 (two) times daily. 04/15/22  Yes [name], MD  ondansetron (ZOFRAN) 8 MG tablet Take 8 mg by mouth 2 (two) times daily as needed for refractory nausea / vomiting.   Yes [provider]  ondansetron (ZOFRAN-ODT) 4 MG disintegrating tablet Take 1 tablet (4 mg total) by mouth every 8 (eight) hours as needed for nausea or vomiting. 03/07/22  Yes Curatolo, Adam, DO  oxyCODONE (OXY IR/ROXICODONE) 5 MG immediate release tablet Take 1 tablet (5 mg total) by mouth every 4 (four) hours as needed for up to 7 days for severe pain. 05/09/22 05/16/22 Yes McDonald, Stephan Minister, DPM  pantoprazole (PROTONIX) 40 MG tablet Take 1 tablet (40 mg total) by mouth daily. 02/07/22  Yes [name], MD  rosuvastatin (CRESTOR) 20 MG tablet Take 1 tablet (20 mg total) by mouth daily. 08/18/21  Yes Ghumman, Ramandeep, NP  senna-docusate (SENOKOT-S) 8.6-50 MG tablet Take 2 tablets by mouth 2 (two) times daily. Patient taking differently: Take 1-2 tablets by mouth daily as needed for mild constipation or moderate constipation. 06/29/21  Yes Ghumman, Ramandeep, NP  sevelamer carbonate (RENVELA) 0.8 g PACK packet Take 0.8 g by mouth 3 (three) times daily with meals. 10/19/21  Yes [provider]  cefadroxil (DURICEF) 500 MG capsule Take 2 capsules (1,000 mg total) by mouth every Monday, Wednesday, and Friday with hemodialysis. 05/04/22   Criselda Peaches, DPM  Continuous Blood Gluc Receiver (FREESTYLE LIBRE 2 READER) DEVI by Does not apply route.    [provider]  hydrALAZINE (APRESOLINE) 10 MG tablet Take 1 tablet (10 mg total) by mouth every 8 (eight) hours as needed (Sys BP >180, hold on HD days). Patient not taking: Reported on 05/10/2022 05/26/21   Love,  Ivan Anchors, PA-C  hydrocortisone  (ANUSOL-HC) 2.5 % rectal cream Place 1 application rectally 2 (two) times daily as needed for hemorrhoids or anal itching. Patient not taking: Reported on 05/10/2022 04/20/21   Jonetta Osgood, MD  Insulin Pen Needle (BD PEN NEEDLE NANO 2ND GEN) 32G X 4 MM MISC See admin instructions. 07/15/21   [provider]  iron sucrose in sodium chloride 0.9 % 100 mL Inject 100 mg into the vein as needed (based on labs). 06/16/21   [provider]  Methoxy PEG-Epoetin Beta (MIRCERA IJ) Mircera 07/21/21 07/20/22  [provider]  mupirocin ointment (BACTROBAN) 2 % Apply 1 application. topically 2 (two) times daily. Patient not taking: Reported on 05/10/2022 02/01/22   Criselda Peaches, DPM  Nutritional Supplements (,FEEDING SUPPLEMENT, PROSOURCE PLUS) liquid Take 30 mLs by mouth 2 (two) times daily between meals. Patient not taking: Reported on 04/14/2022 05/27/21   Love, Ivan Anchors, PA-C  OneTouch Delica Lancets 82N MISC Use as directed to check blood sugars before breakfast and dinner dx: e11.65 08/18/21   Ghumman, Milford Cage, NP  traMADol (ULTRAM) 50 MG tablet TAKE 1 TABLET(50 MG) BY MOUTH EVERY 12 HOURS FOR 5 DAYS Patient not taking: Reported on 05/10/2022 03/28/22   Criselda Peaches, DPM  iron polysaccharides (NIFEREX) 150 MG capsule Take 1 capsule (150 mg total) by mouth daily. Patient not taking: No sig reported 03/24/21 04/16/21  Nita Sells, MD   Current Facility-Administered Medications  Medication Dose Route Frequency Provider Last Rate Last Admin   acetaminophen (TYLENOL) tablet 650 mg  650 mg Oral Q6H PRN Kamineni, Neelima, MD       albuterol (PROVENTIL) (2.5 MG/3ML) 0.083% nebulizer solution 2.5 mg  2.5 mg Nebulization Q2H PRN Guilford Shi, MD       alteplase (CATHFLO ACTIVASE) injection 2 mg  2 mg Intracatheter Once PRN Adelfa Koh, NP       aspirin chewable tablet 81 mg  81 mg Oral Daily Guilford Shi, MD   81 mg at 05/11/22 0859   carvedilol (COREG)  tablet 3.125 mg  3.125 mg Oral BID WC Guilford Shi, MD   3.125 mg at 05/11/22 0854   ceFEPIme (MAXIPIME) 1 g in sodium chloride 0.9 % 100 mL IVPB  1 g Intravenous Q24H Guilford Shi, MD       Chlorhexidine Gluconate Cloth 2 % PADS 6 each  6 each Topical Q0600 Adelfa Koh, NP       diphenhydrAMINE (BENADRYL) tablet 25 mg  25 mg Oral QHS Guilford Shi, MD   25 mg at 05/10/22 2237   fentaNYL (SUBLIMAZE) injection 12.5 mcg  12.5 mcg Intravenous Q2H PRN Guilford Shi, MD   12.5 mcg at 05/11/22 0854   fluticasone (FLONASE) 50 MCG/ACT nasal spray 2 spray  2 spray Each Nare Daily PRN Guilford Shi, MD       hydrALAZINE (APRESOLINE) tablet 10 mg  10 mg Oral Q8H PRN Kamineni, Neelima, MD       insulin aspart (novoLOG) injection 0-6 Units  0-6 Units Subcutaneous TID WC Kamineni, Neelima, MD       insulin glargine-yfgn (SEMGLEE) injection 8 Units  8 Units Subcutaneous QHS Kamineni, Neelima, MD       levETIRAcetam (KEPPRA) tablet 500 mg  500 mg Oral QHS Guilford Shi, MD   500 mg at 05/10/22 2237   lidocaine (PF) (XYLOCAINE) 1 % injection 5 mL  5 mL Intradermal PRN Adelfa Koh, NP       lidocaine-prilocaine (  EMLA) cream 1 Application  1 Application Topical PRN Adelfa Koh, NP       losartan (COZAAR) tablet 50 mg  50 mg Oral Daily Guilford Shi, MD   50 mg at 05/11/22 0859   melatonin tablet 5 mg  5 mg Oral QHS Guilford Shi, MD   5 mg at 05/10/22 2255   methylphenidate (RITALIN) tablet 5 mg  5 mg Oral Daily Guilford Shi, MD   5 mg at 05/11/22 1001   mometasone-formoterol (DULERA) 200-5 MCG/ACT inhaler 2 puff  2 puff Inhalation BID Guilford Shi, MD   2 puff at 05/11/22 0900   ondansetron (ZOFRAN) tablet 8 mg  8 mg Oral BID PRN Guilford Shi, MD       oxyCODONE (Oxy IR/ROXICODONE) immediate release tablet 5 mg  5 mg Oral Q4H PRN Guilford Shi, MD   5 mg at 05/11/22 0053   pantoprazole (PROTONIX) EC tablet 40 mg  40 mg Oral Daily  Guilford Shi, MD   40 mg at 05/11/22 0859   pentafluoroprop-tetrafluoroeth (GEBAUERS) aerosol 1 Application  1 Application Topical PRN Adelfa Koh, NP       rosuvastatin (CRESTOR) tablet 20 mg  20 mg Oral Daily Guilford Shi, MD   20 mg at 05/11/22 0904   senna-docusate (Senokot-S) tablet 1-2 tablet  1-2 tablet Oral Daily PRN Guilford Shi, MD       sevelamer carbonate (RENVELA) packet 0.8 g  0.8 g Oral TID WC Kamineni, Neelima, MD   0.8 g at 05/11/22 0859   vancomycin (VANCOCIN) IVPB 1000 mg/200 mL premix  1,000 mg Intravenous Q M,W,F-HD Guilford Shi, MD       Labs: Basic Metabolic Panel: Recent Labs  Lab 05/10/22 1442 05/11/22 0237  NA 136 137  K 4.3 4.5  CL 94* 100  CO2 28 26  GLUCOSE 136* 101*  BUN 32* 33*  CREATININE 7.26* 7.60*  CALCIUM 9.4 8.8*   Liver Function Tests: Recent Labs  Lab 05/10/22 1442  AST 28  ALT 21  ALKPHOS 57  BILITOT 0.9  PROT 7.6  ALBUMIN 3.1*   No results for input(s): "LIPASE", "AMYLASE" in the last 168 hours. No results for input(s): "AMMONIA" in the last 168 hours. CBC: Recent Labs  Lab 05/10/22 1442 05/11/22 0237  WBC 11.7* 11.7*  NEUTROABS 6.8  --   HGB 9.6* 8.9*  HCT 29.0* 26.3*  MCV 87.1 87.4  PLT 354 338   Cardiac Enzymes: No results for input(s): "CKTOTAL", "CKMB", "CKMBINDEX", "TROPONINI" in the last 168 hours. CBG: Recent Labs  Lab 05/10/22 2159 05/11/22 0732 05/11/22 1204  GLUCAP 110* 73 107*   Iron Studies: No results for input(s): "IRON", "TIBC", "TRANSFERRIN", "FERRITIN" in the last 72 hours. Studies/Results: MR FOOT RIGHT WO CONTRAST  Result Date: 05/11/2022 CLINICAL DATA:  Increasing right foot pain, swelling and discoloration following right great toe amputation about a month ago. History of diabetes. EXAM: MRI OF THE RIGHT FOREFOOT WITHOUT CONTRAST TECHNIQUE: Multiplanar, multisequence MR imaging of the right forefoot was performed. No intravenous contrast was administered.  COMPARISON:  Right foot radiographs 05/10/2022. No other comparison studies. FINDINGS: Despite efforts by the technologist and patient, moderate motion artifact is present on today's exam and could not be eliminated. This reduces exam sensitivity and specificity. Bones/Joint/Cartilage Status post amputation through the mid 1st metatarsal. The amputation margins are sharp, and no suspicious marrow edema or cortical destruction identified in the 1st metatarsal remnant. The additional metatarsals and digits appear normal. The alignment is normal  at the Lisfranc joint. There are no significant forefoot joint effusions. Ligaments Intact Lisfranc ligament. The collateral ligaments of the remaining metatarsophalangeal joints appear intact. Muscles and Tendons Mild T2 hyperintensity within the forefoot muscular attributed to diabetes. Small amount of fluid within the abandoned flexor hallucis longus tendon sheath. Soft tissues Generalized forefoot subcutaneous edema without focal fluid collection or unexpected foreign body. Superficial susceptibility artifact may relate to skin stable seen previously. IMPRESSION: 1. Status post recent amputation through the mid 1st metatarsal. No evidence of osteomyelitis or septic arthritis. 2. Nonspecific soft tissue edema throughout the forefoot which could reflect soft tissue infection (cellulitis). No focal fluid collection identified. Electronically Signed   By: Richardean Sale M.D.   On: 05/11/2022 11:28   DG Foot Complete Right  Result Date: 05/10/2022 CLINICAL DATA:  Partial amputation 1 month ago, pain and warmth EXAM: RIGHT FOOT COMPLETE - 3+ VIEW COMPARISON:  None Available. FINDINGS: Previous amputation across the first metatarsal, with overlying skin staples. There are some subcutaneous gas bubbles just distal to the amputation in the soft tissues. No convincing cortical loss to indicate osteomyelitis. No fracture or dislocation. Diffuse osteopenia. Small calcaneal spur.  Extensive arterial calcifications in the foot. IMPRESSION: 1. Previous amputation across the first metatarsal. Regional subcutaneous gas bubbles conceivably postoperative although infection cannot be entirely excluded. 2. No radiographic evidence of osteomyelitis. Electronically Signed   By: Lucrezia Europe M.D.   On: 05/10/2022 16:56     Physical Exam: Vitals:   05/11/22 0736 05/11/22 0904 05/11/22 1000 05/11/22 1130  BP:  (!) 168/70 (!) 141/59 (!) 145/78  Pulse:  74 73 75  Resp:  '16 17 15  ' Temp: 98.2 F (36.8 C)   98 F (36.7 C)  TempSrc: Oral   Oral  SpO2:  100% 99% 99%  Weight:      Height:         General: Chronically ill-appearing; on 1L [place]; NAD Head: Sclera anicteric Lungs: CTA bilaterally. No wheeze, rales or rhonchi. Breathing is unlabored. Heart: RRR. No murmur, rubs or gallops.  Abdomen: soft, nontender, (+) bowel sounds Lower extremities: R foot discolored with erythema and edema, noted sutures from previous amputation; 1+ edema BLLE Neuro: AAOx3. Moves all extremities spontaneously. Dialysis Access: R TDC; AVF malfunction  Dialysis Orders:  MWF - [facility] 4hrs, BFR 350, DFR 700,  EDW 88.5kg, 2K/ 2Ca Profile 4 Mircera 200 mcg q2wks - last 04/29/22 Venofer 122m IV X 5 doses-new order, hasn't been given yet Hectorol 433m IV qHD-last 05/09/22  Assessment/Plan: Right foot infection/gangrenous changes: VVS and Podiatry following. LE duplex ordered, plan to obtain MRI to r/o osteo, Blood cx sent and on IV ABXs. ESRD - on HD MWF. Plan for HD today per her routine schedule  Hypertension/volume  - Blood pressures acceptable this AM. Not in severe volume overload, HD today. Anemia of CKD - Hgb 8.9, no Fe d/t possible infection and on IV ABXs. ESA due 7/21. Secondary Hyperparathyroidism - Will resume Hectorol and check PO4 in AM Nutrition - Renal diet with fluid restriction, will check albumin in AM  CoTobie PoetNP [facility]/19/2023,  12:58 PM

## 2022-05-11 NOTE — Consult Note (Addendum)
Hospital Consult    Reason for Consult:  non healing right great toe amputation Requesting Physician:  Ardelle Anton MRN #:  [id number]  History of Present Illness: This is a 71 y.o. female who presented to the hospital with non healing right great toe amputation that was performed on 04/16/2022 by Dr. Lilian Kapur.    She previously underwent angiogram with laser atherectomy of right PTA and stent of left PTA on 02/21/2022 by Dr. Randie Heinz. At completion of the case, she had a brisk signal in the PTA that could be traced out onto the foot.  She was seen back on 03/30/2022 and she had a toe pressure of zero but this was felt to be due to ongoing necrosis of the toe.  She did have a great signal to the level of the ankle and the skin of the foot appeared will perfused.  It was felt she was at her best chance to heal a toe amputation.     She comes into the ER today with non healing wound and vascular is asked to evaluate pt.   Pt has hx of right arm 1st stage BVT.  There were some concerns for steal but pt did not want the fistula ligated.  She denies numbness or pain in the right hand.   She is on HD M/W/F at the [facility] [address] location.  The pt is on a statin for cholesterol management.  The pt is not on a daily aspirin.   Other AC:  Plavix The pt is on BB, hydralazine, ARB for hypertension.   The pt is diabetic.   Tobacco hx:  never  Past Medical History:  Diagnosis Date   Acute GI bleeding    Allergy    Anemia    Anterior chest wall pain    Appendicitis 1965   Asthma    Body mass index 37.0-37.9, adult    Breast pain    Cataract    both eyes   CHF (congestive heart failure) (HCC)    Chronic kidney disease    stage 5 - on dialysis   Cognitive change 04/20/2021   r/t cva 03/2821   Complication of anesthesia    Memory loss after general   Dehydration 2014   Deviated septum 1971   Diabetes mellitus    Dysphagia due to old stroke    easy to get strangled when eating   Dyspnea 2014    Extrinsic asthma    WITH ASTHMA ATTACK   Fibroid 1980   GERD (gastroesophageal reflux disease)    Heart murmur    History of migraine    History of seizure    with stroke   Hx gestational diabetes    Hyperlipidemia    Hypertension 2014   Inguinal hernia 1959   Malaise and fatigue 2014   Non-IgE mediated allergic asthma 2014   Obesity    Pelvic pain    Pregnancy, high-risk 1985   Stroke (HCC) 04/20/2021   (CVA) of right basal ganglia   Tonsillitis 1968   Uterine fibroid 1980   Visual field defect    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    Past Surgical History:  Procedure Laterality Date   ABDOMINAL AORTOGRAM W/LOWER EXTREMITY N/A 02/21/2022   Procedure: ABDOMINAL AORTOGRAM W/LOWER EXTREMITY;  Surgeon: Maeola Harman, MD;  Location: [facility] INVASIVE CV LAB;  Service: Cardiovascular;  Laterality: N/A;   AMPUTATION TOE Right 04/15/2022   Procedure: AMPUTATION  LST TOERIGHT FOOT;  Surgeon: Edwin Cap, DPM;  Location: [facility];  Service: Podiatry;  Laterality: Right;   APPENDECTOMY  0093   BASCILIC VEIN TRANSPOSITION Right 04/30/2021   Procedure: RIGHT FIRST STAGE [place];  Surgeon: Angelia Mould, MD;  Location: [place];  Service: Vascular;  Laterality: Right;   CESAREAN SECTION  1985   COLONOSCOPY     ESOPHAGOGASTRODUODENOSCOPY (EGD) WITH PROPOFOL N/A 04/18/2021   Procedure: ESOPHAGOGASTRODUODENOSCOPY (EGD) WITH PROPOFOL;  Surgeon: Gatha Mayer, MD;  Location: [place];  Service: Endoscopy;  Laterality: N/A;   EYE SURGERY     bilateral cataract    FLEXIBLE SIGMOIDOSCOPY N/A 04/18/2021   Procedure: FLEXIBLE SIGMOIDOSCOPY;  Surgeon: Gatha Mayer, MD;  Location: [facility] ENDOSCOPY;  Service: Endoscopy;  Laterality: N/A;   HERNIA REPAIR  1959   IR FLUORO GUIDE CV LINE RIGHT  03/18/2021   IR US GUIDE VASC ACCESS RIGHT  03/18/2021   LEFT HEART CATH AND CORONARY ANGIOGRAPHY N/A 04/04/2017   Procedure: Left Heart Cath and Coronary Angiography;  Surgeon: Belva Crome, MD;  Location: [place] CV LAB;  Service: Cardiovascular;  Laterality: N/A;   [place], 2004, 2007   PERIPHERAL VASCULAR THROMBECTOMY  02/21/2022   Procedure: PERIPHERAL VASCULAR THROMBECTOMY;  Surgeon: Waynetta Sandy, MD;  Location: [place] CV LAB;  Service: Cardiovascular;;  Right Tibial   RHINOPLASTY  1971   ROTATOR CUFF REPAIR  2003   SURGICAL REPAIR OF HEMORRHAGE  2015   TONSILLECTOMY  1968    Allergies  Allergen Reactions   Food Anaphylaxis    Peanuts; Almonds   Wheat Bran     Upset stomach    Statins Itching and Other (See Comments)    Generalized aches- tolerates crestor    Pork-Derived Products     Does not eat pork    Shellfish Allergy Other (See Comments)    Mouth gets raw   Sitagliptin Other (See Comments)    Januiva - unknown reaction    Tetracycline Other (See Comments)    Raw mouth   Contrast Media [Iodinated Contrast Media] Rash    Happened during CT scan over 30 years ago    Prior to Admission medications   Medication Sig Start Date End Date Taking? Authorizing Provider  acetaminophen (TYLENOL) 500 MG tablet Take 1,000 mg by mouth 2 (two) times daily as needed (leg and arm pain).   Yes [provider]  albuterol (VENTOLIN HFA) 108 (90 Base) MCG/ACT inhaler Inhale 2 puffs into the lungs every 4 (four) hours as needed for wheezing or shortness of breath. 04/13/22  Yes Minette Brine, FNP  Alcaftadine (LASTACAFT) 0.25 % SOLN Place 1 drop into both eyes daily as needed (itching).   Yes [provider]  ARTIFICIAL TEAR SOLUTION OP Place 1 drop into both eyes daily as needed (dry eyes).   Yes [provider]  carvedilol (COREG) 3.125 MG tablet Take 1 tablet (3.125 mg total) by mouth 2 (two) times daily with a meal. 11/01/21  Yes [name], MD  clopidogrel (PLAVIX) 75 MG tablet TAKE 1 TABLET(75 MG) BY MOUTH DAILY 02/11/22  Yes [name], MD  diclofenac Sodium (VOLTAREN) 1 % GEL Apply 2 g topically 4 (four)  times daily. To left wrist Patient taking differently: Apply 2 g topically daily as needed (pain). To left wrist 05/26/21  Yes Love, Ivan Anchors, PA-C  diphenhydrAMINE (BENADRYL) 2 % cream Apply 1 application. topically daily as needed for itching.   Yes [provider]  diphenhydrAMINE (BENADRYL) 25 MG  tablet Take 25 mg by mouth at bedtime.   Yes [provider]  fluticasone (FLONASE) 50 MCG/ACT nasal spray Place 2 sprays into the nose daily as needed for allergies.    Yes [provider]  Insulin Aspart, w/Niacinamide, (FIASP) 100 UNIT/ML SOLN Inject 6-16 Units as directed 3 (three) times daily as needed (high blood sugar). Sliding scale 100-150=6 units 151-200=10 units 201-250=12 units 251-300=14 units 301-350=16 units Greater than 350 = 16 units   Yes [provider]  insulin glargine (LANTUS) 100 UNIT/ML Solostar Pen Inject 13 Units into the skin daily. Patient taking differently: Inject 12 Units into the skin at bedtime. 08/18/21  Yes Ghumman, Ramandeep, NP  levETIRAcetam (KEPPRA) 500 MG tablet TAKE 1 TABLET(500 MG) BY MOUTH AT BEDTIME 01/07/22  Yes Lovorn, Megan, MD  Lidocaine 4 % GEL Apply 1 Application topically daily as needed (pain).   Yes [provider]  losartan (COZAAR) 50 MG tablet Take 50 mg by mouth daily. 01/21/22  Yes [provider]  melatonin 5 MG TABS Take 5 mg by mouth at bedtime.   Yes [provider]  methylphenidate (RITALIN) 5 MG tablet Take 1 tablet (5 mg total) by mouth 2 (two) times daily with breakfast and lunch. Due to stroke attention Patient taking differently: Take 5 mg by mouth daily. Due to stroke attention 03/04/22  Yes Lovorn, Jinny Blossom, MD  mometasone-formoterol (DULERA) 200-5 MCG/ACT AERO Inhale 2 puffs into the lungs 2 (two) times daily. 04/15/22  Yes [name], MD  ondansetron (ZOFRAN) 8 MG tablet Take 8 mg by mouth 2 (two) times daily as needed for refractory nausea / vomiting.   Yes [provider]  ondansetron (ZOFRAN-ODT) 4 MG disintegrating tablet Take 1 tablet (4 mg total) by mouth every 8 (eight) hours as needed for nausea or vomiting. 03/07/22  Yes Curatolo, Adam, DO  oxyCODONE (OXY IR/ROXICODONE) 5 MG immediate release tablet Take 1 tablet (5 mg total) by mouth every 4 (four) hours as needed for up to 7 days for severe pain. 05/09/22 05/16/22 Yes McDonald, Stephan Minister, DPM  pantoprazole (PROTONIX) 40 MG tablet Take 1 tablet (40 mg total) by mouth daily. 02/07/22  Yes [name], MD  rosuvastatin (CRESTOR) 20 MG tablet Take 1 tablet (20 mg total) by mouth daily. 08/18/21  Yes Ghumman, Ramandeep, NP  senna-docusate (SENOKOT-S) 8.6-50 MG tablet Take 2 tablets by mouth 2 (two) times daily. Patient taking differently: Take 1-2 tablets by mouth daily as needed for mild constipation or moderate constipation. 06/29/21  Yes Ghumman, Ramandeep, NP  sevelamer carbonate (RENVELA) 0.8 g PACK packet Take 0.8 g by mouth 3 (three) times daily with meals. 10/19/21  Yes [provider]  cefadroxil (DURICEF) 500 MG capsule Take 2 capsules (1,000 mg total) by mouth every Monday, Wednesday, and Friday with hemodialysis. 05/04/22   Criselda Peaches, DPM  Continuous Blood Gluc Receiver (FREESTYLE LIBRE 2 READER) DEVI by Does not apply route.    [provider]  hydrALAZINE (APRESOLINE) 10 MG tablet Take 1 tablet (10 mg total) by mouth every 8 (eight) hours as needed (Sys BP >180, hold on HD days). Patient not taking: Reported on 05/10/2022 05/26/21   Love, Ivan Anchors, PA-C  hydrocortisone (ANUSOL-HC) 2.5 % rectal cream Place 1 application rectally 2 (two) times daily as needed for hemorrhoids or anal itching. Patient not taking: Reported on 05/10/2022 04/20/21   Jonetta Osgood, MD  Insulin Pen Needle (BD PEN NEEDLE NANO 2ND GEN) 32G X 4 MM  MISC See admin instructions. 07/15/21   [provider]  iron sucrose in sodium chloride 0.9 % 100 mL Inject 100 mg into the vein as needed  (based on labs). 06/16/21   [provider]  Methoxy PEG-Epoetin Beta (MIRCERA IJ) Mircera 07/21/21 07/20/22  [provider]  mupirocin ointment (BACTROBAN) 2 % Apply 1 application. topically 2 (two) times daily. Patient not taking: Reported on 05/10/2022 02/01/22   Criselda Peaches, DPM  Nutritional Supplements (,FEEDING SUPPLEMENT, PROSOURCE PLUS) liquid Take 30 mLs by mouth 2 (two) times daily between meals. Patient not taking: Reported on 04/14/2022 05/27/21   Love, Ivan Anchors, PA-C  OneTouch Delica Lancets 87O MISC Use as directed to check blood sugars before breakfast and dinner dx: e11.65 08/18/21   Ghumman, Milford Cage, NP  traMADol (ULTRAM) 50 MG tablet TAKE 1 TABLET(50 MG) BY MOUTH EVERY 12 HOURS FOR 5 DAYS Patient not taking: Reported on 05/10/2022 03/28/22   Criselda Peaches, DPM  iron polysaccharides (NIFEREX) 150 MG capsule Take 1 capsule (150 mg total) by mouth daily. Patient not taking: No sig reported 03/24/21 04/16/21  Nita Sells, MD    Social History   Socioeconomic History   Marital status: Married    Spouse name: [name]   Number of children: 1   Years of education: Not on file   Highest education level: Professional school degree (e.g., MD, DDS, DVM, JD)  Occupational History   Occupation: retired  Tobacco Use   Smoking status: Never   Smokeless tobacco: Never  Vaping Use   Vaping Use: Never used  Substance and Sexual Activity   Alcohol use: No   Drug use: No   Sexual activity: Not Currently    Birth control/protection: Post-menopausal  Other Topics Concern   Not on file  Social History Narrative   06/21/21 lives with husband Dr Marylynn Pearson, caregiver- Zigmund Daniel   Patient reports she used to walk at [employer] but now due to Covid-19 she is staying inside.   Social Determinants of Health   Financial Resource Strain: Low Risk  (09/02/2021)   Overall Financial Resource Strain (CARDIA)    Difficulty of Paying Living Expenses: Not hard at all  Food  Insecurity: No Food Insecurity (09/02/2021)   Hunger Vital Sign    Worried About Running Out of Food in the Last Year: Never true    Ran Out of Food in the Last Year: Never true  Transportation Needs: No Transportation Needs (09/02/2021)   PRAPARE - Hydrologist (Medical): No    Lack of Transportation (Non-Medical): No  Physical Activity: Inactive (09/02/2021)   Exercise Vital Sign    Days of Exercise per Week: 0 days    Minutes of Exercise per Session: 0 min  Stress: Stress Concern Present (09/02/2021)   [place]    Feeling of Stress : To some extent  Social Connections: Socially Integrated (01/09/2019)   Social Connection and Isolation Panel [NHANES]    Frequency of Communication with Friends and Family: More than three times a week    Frequency of Social Gatherings with Friends and Family: More than three times a week    Attends Religious Services: More than 4 times per year    Active Member of [employer] or Organizations: Yes    Attends Archivist Meetings: More than 4 times per year    Marital Status: Married  Human resources officer Violence: Not At Risk (03/27/2019)   Humiliation,  Afraid, Rape, and Kick questionnaire    Fear of Current or Ex-Partner: No    Emotionally Abused: No    Physically Abused: No    Sexually Abused: No     Family History  Problem Relation Age of Onset   Cancer Mother        abdominal melamona   Psoriasis Mother    Alzheimer's disease Father    Cancer Cousin        colon    Diabetes Cousin    Diabetes Maternal Aunt    Colon cancer Neg Hx    Colon polyps Neg Hx    Esophageal cancer Neg Hx    Rectal cancer Neg Hx    Stomach cancer Neg Hx    Breast cancer Neg Hx     ROS: $Rem'[x]'qlQN$  Positive   '[ ]'$  Negative   '[ ]'$  All sytems reviewed and are negative  Cardiac: $RemoveB'[x]'ilgMtVmL$  CHF   Vascular: $RemoveBe'[x]'MUWjgjFwR$  non-healing ulcers   Pulmonary: $RemoveBef'[]'LVXhjaLKtY$  asthma/wheezing $RemoveBeforeDE'[]'ysHjjnyRZZZTnOK$  home  O2  Neurologic: $RemoveBefo'[]'MoNYQIrqLhH$  hx of CVA $Remo'[]'NdyjX$  mini stroke   Hematologic: $RemoveBefor'[]'dUqIJSTNNDrk$  hx of cancer  Endocrine:   '[x]'$  diabetes $RemoveB'[]'AVfRyRCn$  thyroid disease  GI $R'[]'Vc$  GERD  GU: $Re'[x]'eWj$  CKD/renal failure $RemoveBeforeDEI'[x]'YuvQacmapMSWaAHS$  HD'[x]'$  M/W/F or $Remove'[]'GGNjxKg$  T/T/S  Psychiatric: $RemoveBefor'[]'yRMvKzfrHDsP$  anxiety $Remove'[]'eANarIm$  depression  Musculoskeletal: $RemoveBeforeDEI'[]'lggqKHGQllyeulMd$  arthritis $RemoveBe'[]'DaMwgdWWV$  joint pain  Integumentary: $RemoveBeforeD'[]'gpkxcsDiPJHcTK$  rashes $Remov'[]'nqIlec$  ulcers  Constitutional: $RemoveBeforeDE'[]'oBlqUBhMUwyULME$  fever  $Remo'[]'jLIJI$  chills  Physical Examination  Vitals:   05/11/22 0700 05/11/22 0736  BP: (!) 167/66   Pulse: 72   Resp: (!) 32   Temp:  98.2 F (36.8 C)  SpO2: 100%    Body mass index is 32.54 kg/m.  General:  WDWN in NAD Gait: Not observed HENT: WNL, normocephalic Pulmonary: normal non-labored breathing Cardiac: regular Abdomen:  soft Skin: without rashes Vascular Exam/Pulses:  Extremities:  Right hand with motor and sensory in tact; excellent thrill in right 1st stage BVT non healing right great toe wound  + doppler signal right PT/AT-unable to obtain DP.  Her right femoral pulse is palpable.      Musculoskeletal: no muscle wasting or atrophy  Neurologic: A&O X 3; speech is fluent/normal Psychiatric:  The pt has Normal affect.   CBC    Component Value Date/Time   WBC 11.7 (H) 05/11/2022 0237   RBC 3.01 (L) 05/11/2022 0237   HGB 8.9 (L) 05/11/2022 0237   HGB 9.3 (L) 04/13/2022 1512   HCT 26.3 (L) 05/11/2022 0237   HCT 27.6 (L) 04/13/2022 1512   PLT 338 05/11/2022 0237   PLT 341 04/13/2022 1512   MCV 87.4 05/11/2022 0237   MCV 89 04/13/2022 1512   MCH 29.6 05/11/2022 0237   MCHC 33.8 05/11/2022 0237   RDW 18.8 (H) 05/11/2022 0237   RDW 16.1 (H) 04/13/2022 1512   LYMPHSABS 2.5 05/10/2022 1442   LYMPHSABS 2.4 04/13/2022 1512   MONOABS 1.8 (H) 05/10/2022 1442   EOSABS 0.4 05/10/2022 1442   EOSABS 0.4 04/13/2022 1512   BASOSABS 0.1 05/10/2022 1442   BASOSABS 0.1 04/13/2022 1512    BMET    Component Value Date/Time   NA 137 05/11/2022 0237   NA 142 04/13/2022 1512   K 4.5 05/11/2022 0237   CL 100  05/11/2022 0237   CO2 26 05/11/2022 0237   GLUCOSE 101 (H) 05/11/2022 0237   BUN 33 (H) 05/11/2022 0237   BUN 12 04/13/2022 1512   CREATININE 7.60 (H) 05/11/2022 0237   CREATININE 0.84 10/13/2011 0903   CALCIUM 8.8 (L) 05/11/2022  [place] (L) 05/11/2022 0237   GFRNONAA 75 10/13/2011 0903   GFRAA 17 06/19/2020 0000   GFRAA 86 10/13/2011 0903    COAGS: Lab Results  Component Value Date   INR 1.2 05/10/2022   INR 1.1 04/13/2022   INR 1.0 04/14/2021       ASSESSMENT/PLAN: This is a 71 y.o. female with ESRD, PAD with hx of angiogram with laser atherectomy of right PTA and stent of left PTA on 02/21/2022 by Dr. [name].   -pt with non healing right great toe amputation.  I was able to obtain faint right PT and AT doppler signals but could not get DP.  She does have palpable right femoral pulse.  Do not feel she is going to heal amputation and will  require more proximal amputation.   She most likely will not heal a TMA.   -Dr. Carlis Abbott will see pt today and provide further recommendations.    Leontine Locket, PA-C [facility] [phone]   I have seen and evaluated the patient. I agree with the PA note as documented above.  71 year old female with history of end-stage renal disease, diabetes and PAD that vascular surgery has been consulted for worsening tissue loss with necrosis in the right foot.  Patient underwent right posterior tibial intervention including stent by Dr. [name] on 02/21/2022 for CLI.  She then underwent a partial first ray amputation by podiatry on 04/16/2022.  As pictured above the toe amputation has had significant progression of necrosis worse over the last week.  I can get a brisk peroneal signal but no posterior tibial signal on exam.  The peroneal was her dominant runoff on previous imaging.  I suspect her PT intervention is occluded.  I have discussed with podiatry who is going to come back and further discuss options with the patient and her  husband today.  My concern is given the extent of tissue loss it would be very difficult to have any option for reconstruction.  I have asked that the family consider a more proximal amputation.  Discussed below-knee amputation having about a 40% nonhealing rate versus an above-knee amputation with a higher healing rate.  Vascular will continue to follow.  I will order a right lower extremity arterial duplex as this may help make a decision moving forward.  Marty Heck, MD [facility] [place] Office: [phone]

## 2022-05-11 NOTE — Procedures (Signed)
HD Note Patient has complained of nausea since arriving on the [facility].  It has not gotten worse with treatment.  Zofran was given po with no noted results.  The nausea has  Patient has become more confused as time as passed.  She is pleasant and cooperative.

## 2022-05-11 NOTE — Progress Notes (Signed)
Received patient in bed, alert and oriented, cc of feeling nauseated, RN just gave meds. On going HD tx .Report received from RN @ 1900H  Pt demanded to end treatment at [place] after Vancomycin all infused. Early termination paper signed. Discussed the cons with patient. She verbalized she is aware.  Catheter no signs and symptoms of complications. Patient transported back to the room, alert, oriented and in no acute distress. Report given to bedside RN.  Total UF removed:1759m  Medication given: Zofran and Vancomycin (refer MAR)  Post HD VS: BP 170/591mg, HR 80bpm, RR18 brpm, Sats 99% RA, T-99.14F  Post HD weight: 96.2kg

## 2022-05-11 NOTE — Progress Notes (Signed)
PROGRESS NOTE   Carolyn Russo  UDJ:[id number] DOB: 08/31/1951 DOA: 05/10/2022 PCP: [name], MD  Brief Narrative:  71 year old black female ESRD on HD MWF acute basal ganglia CVA left-sided weakness 5/88/5027 complicated by seizures?  05/07/2021 admission DM TY 2 HTN HLD HFrEF Peripheral arterial disease with associated ischemia Lower GI bleed normal EGD 04/19/2019 flex sig hemorrhoids Stool Recent admission 6/23 and 04/17/2022 dry gangrene of right great toe and underwent elective operation as per Dr. Guy Sandifer of podiatry--- return to ED with slightly worsening of the leg despite having completed 2 weeks of cefadroxil 1000 with HD Worsening gangrenous changes to right forefoot WBC 11 platelet 354 BUNs/creatinine 32/7.2 CRP 9.4 potassium 4.3 lactic acid 0.9 MRI obtained Patient started on vancomycin and cefepime  Hospital-Problem based course  Gangrene to right foot Prior PAD-recent elective right great toe amputation Vascular input appreciated May require amputation defer to podiatry Pain control Oxy IR 5 every 4 as needed moderate pain-Percocet for severe pain Continue vancomycin and cefepime ESRD MWF Renal input appreciated-continue Renvela 0.8 3 times daily Continue losartan 50 Basal ganglia CVA 04/20/2022 with left-sided weakness Underlying seizures Continue aspirin 81-secondary preventive measures Crestor 20 Continue Keppra 500 at bedtime Prior lower GI bleed-monitor trends  DVT prophylaxis: TED hose Code Status: Full Family Communication: Discussed with Dr. [name] patient's husband at the bedside Disposition:  Status is: Inpatient Remains inpatient appropriate because: May require amputation   Consultants:  Renal Vascular  Procedures: Multiple  Antimicrobials: As above   Subjective: Coherent and 9/10 pain no distress otherwise other than foot No fever no chills No nausea no vomiting  Objective: Vitals:   05/11/22 0237 05/11/22 0500 05/11/22 0600  05/11/22 0700  BP: (!) 170/70 (!) 135/57 (!) 150/71 (!) 167/66  Pulse: 78 73 70 72  Resp: (!) 22 18 (!) 30 (!) 32  Temp: 98.5 F (36.9 C)     TempSrc: Oral     SpO2: 93% 99% 99% 100%  Weight:      Height:        Intake/Output Summary (Last 24 hours) at 05/11/2022 0707 Last data filed at 05/10/2022 1840 Gross per 24 hour  Intake 101.58 ml  Output --  Net 101.58 ml   Filed Weights   05/10/22 1343  Weight: 82 kg    Examination:  EOMI NCAT no focal deficit S1-S2 no murmur no rub no gallop Chest clear no rales rhonchi No wheeze Abdomen soft nontender Postop changes with gangrene to right foot  Data Reviewed: personally reviewed   CBC    Component Value Date/Time   WBC 11.7 (H) 05/11/2022 0237   RBC 3.01 (L) 05/11/2022 0237   HGB 8.9 (L) 05/11/2022 0237   HGB 9.3 (L) 04/13/2022 1512   HCT 26.3 (L) 05/11/2022 0237   HCT 27.6 (L) 04/13/2022 1512   PLT 338 05/11/2022 0237   PLT 341 04/13/2022 1512   MCV 87.4 05/11/2022 0237   MCV 89 04/13/2022 1512   MCH 29.6 05/11/2022 0237   MCHC 33.8 05/11/2022 0237   RDW 18.8 (H) 05/11/2022 0237   RDW 16.1 (H) 04/13/2022 1512   LYMPHSABS 2.5 05/10/2022 1442   LYMPHSABS 2.4 04/13/2022 1512   MONOABS 1.8 (H) 05/10/2022 1442   EOSABS 0.4 05/10/2022 1442   EOSABS 0.4 04/13/2022 1512   BASOSABS 0.1 05/10/2022 1442   BASOSABS 0.1 04/13/2022 1512      Latest Ref Rng & Units 05/11/2022    2:37 AM 05/10/2022    2:42 PM 04/17/2022  11:17 AM  CMP  Glucose 70 - 99 mg/dL 101  136  196   BUN 8 - 23 mg/dL 33  32  38   Creatinine 0.44 - 1.00 mg/dL 7.60  7.26  6.98   Sodium 135 - 145 mmol/L 137  136  134   Potassium 3.5 - 5.1 mmol/L 4.5  4.3  4.4   Chloride 98 - 111 mmol/L 100  94  100   CO2 22 - 32 mmol/L '26  28  23   '$ Calcium 8.9 - 10.3 mg/dL 8.8  9.4  8.8   Total Protein 6.5 - 8.1 g/dL  7.6  7.0   Total Bilirubin 0.3 - 1.2 mg/dL  0.9  0.6   Alkaline Phos 38 - 126 U/L  57  56   AST 15 - 41 U/L  28  14   ALT 0 - 44 U/L  21  6       Radiology Studies: DG Foot Complete Right  Result Date: 05/10/2022 CLINICAL DATA:  Partial amputation 1 month ago, pain and warmth EXAM: RIGHT FOOT COMPLETE - 3+ VIEW COMPARISON:  None Available. FINDINGS: Previous amputation across the first metatarsal, with overlying skin staples. There are some subcutaneous gas bubbles just distal to the amputation in the soft tissues. No convincing cortical loss to indicate osteomyelitis. No fracture or dislocation. Diffuse osteopenia. Small calcaneal spur. Extensive arterial calcifications in the foot. IMPRESSION: 1. Previous amputation across the first metatarsal. Regional subcutaneous gas bubbles conceivably postoperative although infection cannot be entirely excluded. 2. No radiographic evidence of osteomyelitis. Electronically Signed   By: Lucrezia Europe M.D.   On: 05/10/2022 16:56     Scheduled Meds:  aspirin  81 mg Oral Daily   carvedilol  3.125 mg Oral BID WC   diphenhydrAMINE  25 mg Oral QHS   insulin aspart  0-6 Units Subcutaneous TID WC   insulin glargine-yfgn  8 Units Subcutaneous QHS   levETIRAcetam  500 mg Oral QHS   losartan  50 mg Oral Daily   melatonin  5 mg Oral QHS   methylphenidate  5 mg Oral Daily   mometasone-formoterol  2 puff Inhalation BID   pantoprazole  40 mg Oral Daily   rosuvastatin  20 mg Oral Daily   sevelamer carbonate  0.8 g Oral TID WC   Continuous Infusions:  sodium chloride 75 mL/hr at 05/11/22 0505   ceFEPime (MAXIPIME) IV     vancomycin       LOS: 1 day   Time spent: [place], MD [facility] To contact the attending provider between 7A-7P or the covering provider during after hours 7P-7A, please log into the web site [email or web address] and access using universal [place] password for that web site. If you do not have the password, please call the hospital operator.  05/11/2022, 7:07 AM

## 2022-05-12 ENCOUNTER — Inpatient Hospital Stay (HOSPITAL_COMMUNITY): Payer: Medicare PPO

## 2022-05-12 DIAGNOSIS — T8781 Dehiscence of amputation stump: Secondary | ICD-10-CM | POA: Diagnosis not present

## 2022-05-12 DIAGNOSIS — I96 Gangrene, not elsewhere classified: Secondary | ICD-10-CM

## 2022-05-12 DIAGNOSIS — L089 Local infection of the skin and subcutaneous tissue, unspecified: Secondary | ICD-10-CM

## 2022-05-12 LAB — BASIC METABOLIC PANEL
Anion gap: 11 (ref 5–15)
BUN: 22 mg/dL (ref 8–23)
CO2: 26 mmol/L (ref 22–32)
Calcium: 8.4 mg/dL — ABNORMAL LOW (ref 8.9–10.3)
Chloride: 98 mmol/L (ref 98–111)
Creatinine, Ser: 5.07 mg/dL — ABNORMAL HIGH (ref 0.44–1.00)
GFR, Estimated: 8 mL/min — ABNORMAL LOW (ref >60)
Glucose, Bld: 100 mg/dL — ABNORMAL HIGH (ref 70–99)
Potassium: 4.2 mmol/L (ref 3.5–5.1)
Sodium: 135 mmol/L (ref 135–145)

## 2022-05-12 LAB — HEPATITIS B SURFACE ANTIBODY, QUANTITATIVE: Hep B S AB Quant (Post): 7.5 m[IU]/mL — ABNORMAL LOW (ref >9.9)

## 2022-05-12 LAB — GLUCOSE, CAPILLARY
Glucose-Capillary: 85 mg/dL (ref 70–99)
Glucose-Capillary: 80 mg/dL (ref 70–99)
Glucose-Capillary: 148 mg/dL — ABNORMAL HIGH (ref 70–99)
Glucose-Capillary: 111 mg/dL — ABNORMAL HIGH (ref 70–99)

## 2022-05-12 MED ORDER — ALTEPLASE 2 MG IJ SOLR: 2.0000 mg | Freq: Once | INTRAMUSCULAR | Status: DC | PRN

## 2022-05-12 MED ORDER — PENTAFLUOROPROP-TETRAFLUOROETH EX AERO: 1.0000 | INHALATION_SPRAY | CUTANEOUS | Status: DC | PRN

## 2022-05-12 MED ORDER — LIDOCAINE-PRILOCAINE 2.5-2.5 % EX CREA
1.0000 | TOPICAL_CREAM | CUTANEOUS | Status: DC | PRN
Start: 2022-05-12 — End: 2022-05-13

## 2022-05-12 MED ORDER — DOXERCALCIFEROL 4 MCG/2ML IV SOLN: 4.0000 ug | INTRAVENOUS | Status: DC

## 2022-05-12 MED ORDER — LIDOCAINE HCL (PF) 1 % IJ SOLN: 5.0000 mL | INTRAMUSCULAR | Status: DC | PRN

## 2022-05-12 NOTE — Progress Notes (Signed)
RLE arterial duplex has been completed.   Results can be found under chart review under CV PROC. 05/12/2022 5:41 PM Pricella Gaugh RVT, RDMS

## 2022-05-12 NOTE — Progress Notes (Addendum)
[facility] Progress Note   Subjective:    Seen and examined patient at bedside. No issues/complaints. She tolerated yesterday's HD with net UF 1.7L. Noted PRN Zofran given with HD for nausea. Reviewed VVS note this AM: awaiting arterial duplex to assist whether to proceed with BKA vs AKA.  Objective Vitals:   05/11/22 2000 05/11/22 2031 05/12/22 0600 05/12/22 0836  BP: 131/60 (!) 146/52 (!) 149/55 (!) 175/54  Pulse: 83 85 77 79  Resp: (!) '23 20 20 19  '$ Temp:  98 F (36.7 C) 98.2 F (36.8 C) 98.8 F (37.1 C)  TempSrc:  Oral Oral Oral  SpO2: 98% 100% 100% 97%  Weight:      Height:       Physical Exam General: Awake, alert, on RA, NAD Heart: S1 and S2; No murmurs, gallops, or rubs Lungs: Clear bilaterally; No wheezing, rales, or rhonchi Abdomen: Soft and non-tender Extremities: R foot discolored with erythema and edema, noted sutures from previous amputation; now trace edema BLLEs Dialysis Access: R [facility]; AVF malfunction   Filed Weights   05/10/22 1343 05/11/22 1947  Weight: 82 kg 96.2 kg    Intake/Output Summary (Last 24 hours) at 05/12/2022 0942 Last data filed at 05/11/2022 1947 Gross per 24 hour  Intake 240 ml  Output 1700 ml  Net -1460 ml    Additional Objective Labs: Basic Metabolic Panel: Recent Labs  Lab 05/10/22 1442 05/11/22 0237 05/12/22 0346  NA 136 137 135  K 4.3 4.5 4.2  CL 94* 100 98  CO2 '28 26 26  '$ GLUCOSE 136* 101* 100*  BUN 32* 33* 22  CREATININE 7.26* 7.60* 5.07*  CALCIUM 9.4 8.8* 8.4*   Liver Function Tests: Recent Labs  Lab 05/10/22 1442  AST 28  ALT 21  ALKPHOS 57  BILITOT 0.9  PROT 7.6  ALBUMIN 3.1*   No results for input(s): "LIPASE", "AMYLASE" in the last 168 hours. CBC: Recent Labs  Lab 05/10/22 1442 05/11/22 0237  WBC 11.7* 11.7*  NEUTROABS 6.8  --   HGB 9.6* 8.9*  HCT 29.0* 26.3*  MCV 87.1 87.4  PLT 354 338   Blood Culture    Component Value Date/Time   SDES BLOOD RIGHT ANTECUBITAL 05/10/2022  1546   SPECREQUEST  05/10/2022 1546    BOTTLES DRAWN AEROBIC AND ANAEROBIC Blood Culture adequate volume   CULT  05/10/2022 1546    NO GROWTH 2 DAYS Performed at [facility], [place] [address], [place], [place] [address]    REPTSTATUS PENDING 05/10/2022 1546    Cardiac Enzymes: No results for input(s): "CKTOTAL", "CKMB", "CKMBINDEX", "TROPONINI" in the last 168 hours. CBG: Recent Labs  Lab 05/10/22 2159 05/11/22 0732 05/11/22 1204 05/11/22 2033 05/12/22 0822  GLUCAP 110* 73 107* 112* 85   Iron Studies: No results for input(s): "IRON", "TIBC", "TRANSFERRIN", "FERRITIN" in the last 72 hours. Lab Results  Component Value Date   INR 1.2 05/10/2022   INR 1.1 04/13/2022   INR 1.0 04/14/2021   Studies/Results: MR FOOT RIGHT WO CONTRAST  Result Date: 05/11/2022 CLINICAL DATA:  Increasing right foot pain, swelling and discoloration following right great toe amputation about a month ago. History of diabetes. EXAM: MRI OF THE RIGHT FOREFOOT WITHOUT CONTRAST TECHNIQUE: Multiplanar, multisequence MR imaging of the right forefoot was performed. No intravenous contrast was administered. COMPARISON:  Right foot radiographs 05/10/2022. No other comparison studies. FINDINGS: Despite efforts by the technologist and patient, moderate motion artifact is present on today's exam and could not  be eliminated. This reduces exam sensitivity and specificity. Bones/Joint/Cartilage Status post amputation through the mid 1st metatarsal. The amputation margins are sharp, and no suspicious marrow edema or cortical destruction identified in the 1st metatarsal remnant. The additional metatarsals and digits appear normal. The alignment is normal at the Lisfranc joint. There are no significant forefoot joint effusions. Ligaments Intact Lisfranc ligament. The collateral ligaments of the remaining metatarsophalangeal joints appear intact. Muscles and Tendons Mild T2 hyperintensity within the forefoot muscular  attributed to diabetes. Small amount of fluid within the abandoned flexor hallucis longus tendon sheath. Soft tissues Generalized forefoot subcutaneous edema without focal fluid collection or unexpected foreign body. Superficial susceptibility artifact may relate to skin stable seen previously. IMPRESSION: 1. Status post recent amputation through the mid 1st metatarsal. No evidence of osteomyelitis or septic arthritis. 2. Nonspecific soft tissue edema throughout the forefoot which could reflect soft tissue infection (cellulitis). No focal fluid collection identified. Electronically Signed   By: Richardean Sale M.D.   On: 05/11/2022 11:28   DG Foot Complete Right  Result Date: 05/10/2022 CLINICAL DATA:  Partial amputation 1 month ago, pain and warmth EXAM: RIGHT FOOT COMPLETE - 3+ VIEW COMPARISON:  None Available. FINDINGS: Previous amputation across the first metatarsal, with overlying skin staples. There are some subcutaneous gas bubbles just distal to the amputation in the soft tissues. No convincing cortical loss to indicate osteomyelitis. No fracture or dislocation. Diffuse osteopenia. Small calcaneal spur. Extensive arterial calcifications in the foot. IMPRESSION: 1. Previous amputation across the first metatarsal. Regional subcutaneous gas bubbles conceivably postoperative although infection cannot be entirely excluded. 2. No radiographic evidence of osteomyelitis. Electronically Signed   By: Lucrezia Europe M.D.   On: 05/10/2022 16:56    Medications:  ceFEPime (MAXIPIME) IV 1 g (05/11/22 2116)   vancomycin Stopped (05/11/22 1947)    aspirin  81 mg Oral Daily   carvedilol  3.125 mg Oral BID WC   Chlorhexidine Gluconate Cloth  6 each Topical Q0600   diphenhydrAMINE  25 mg Oral QHS   insulin aspart  0-6 Units Subcutaneous TID WC   insulin glargine-yfgn  8 Units Subcutaneous QHS   levETIRAcetam  500 mg Oral QHS   losartan  50 mg Oral Daily   melatonin  5 mg Oral QHS   methylphenidate  5 mg Oral  Daily   mometasone-formoterol  2 puff Inhalation BID   pantoprazole  40 mg Oral Daily   rosuvastatin  20 mg Oral Daily   sevelamer carbonate  0.8 g Oral TID WC    Dialysis Orders: MWF - [facility] 4hrs, BFR 350, DFR 700,  EDW 88.5kg, 2K/ 2Ca Profile 4 Mircera 200 mcg q2wks - last 04/29/22 Venofer '100mg'$  IV X 5 doses-new order, hasn't been given yet Hectorol 25mg IV qHD-last 05/09/22   Assessment/Plan: Right foot infection/gangrenous changes: VVS and Podiatry following. LE duplex ordered, plan to obtain MRI to r/o osteo, Blood cx sent and on IV Vanc and Cefepime. Per VVS: awaiting arterial duplex, deciding whether to proceed with BKA vs. AKA. ESRD - on HD MWF. On schedule. Plan for HD 7/21.  Hypertension/volume  - volume status is improving, noted over EDW yesterday not convinced this is accurate, push UF as tolerated. Continue Carvedilol and Losartan Anemia of CKD - Hgb 8.9, no Fe d/t possible infection and on IV ABXs. ESA due 7/21. Secondary Hyperparathyroidism - Will resume Hectorol and check PO4 in AM Nutrition - Renal diet with fluid restriction, will check albumin in AM  Tobie Poet, NP [facility] 05/12/2022,9:42 AM  LOS: 2 days

## 2022-05-12 NOTE — Progress Notes (Signed)
Subjective: 71 year old female seen today in dialysis.  She does report some pain to her foot. No concerns otherwise today.   Objective: AAO x3, NAD Extensive necrosis present on the dorsal and plantar aspect of the medial aspect of the foot.  Bandages present to the fifth toe.  Hyperpigmentation extending proximal to the midfoot area.  There is no crepitation there is no significant purulence.  Appears to be stable No pain with calf compression, swelling, warmth, erythema       Assessment: Gangrenous changes right foot  Plan: Awaiting arterial studies and likely will proceed with proximal amputation.  Until then podiatry will continue to follow.  Continue broad-spectrum antibiotics for now.  Celesta Gentile, DPM

## 2022-05-12 NOTE — Progress Notes (Addendum)
      Subjective  - Rest pain right LE   Objective (!) 149/55 77 98.2 F (36.8 C) (Oral) 20 100%  Intake/Output Summary (Last 24 hours) at 05/12/2022 0704 Last data filed at 05/11/2022 1947 Gross per 24 hour  Intake 1133.52 ml  Output 1700 ml  Net -566.48 ml   Right LE non healing amputation site Motor of leg intact with tenderness to touch , warm to touch, no drainage dry gangrene Lungs non labored breathing General no acute distress   Assessment/Planning: Non healing right GT amputation PAD with history of  angiogram with laser atherectomy of right PTA and stent of left PTA on 02/21/2022 by Dr. [name].   Pending arterial duplex to assist with decision making regarding BKA verses AKA.  Carolyn Russo 05/12/2022 7:04 AM --  Laboratory Lab Results: Recent Labs    05/10/22 1442 05/11/22 0237  WBC 11.7* 11.7*  HGB 9.6* 8.9*  HCT 29.0* 26.3*  PLT 354 338   BMET Recent Labs    05/11/22 0237 05/12/22 0346  NA 137 135  K 4.5 4.2  CL 100 98  CO2 26 26  GLUCOSE 101* 100*  BUN 33* 22  CREATININE 7.60* 5.07*  CALCIUM 8.8* 8.4*    COAG Lab Results  Component Value Date   INR 1.2 05/10/2022   INR 1.1 04/13/2022   INR 1.0 04/14/2021   No results found for: "PTT"  I have independently interviewed and examined patient and agree with PA assessment and plan above. She will certainly require more proximal amputation and I have conveyed this to her and her husband via telephone.  We are checking lower extremity duplex to evaluate her previous revascularization but either way I am not sure this is going to matter given the extent of tissue loss.  I recommended below-knee amputation versus above-knee amputation as a more definitive means of healing.  Dr. [name] is going to talk with his wife this evening.  I have tentatively made her n.p.o. in the event we are ready to proceed with surgery tomorrow.  Endy Easterly C. [name], MD [facility]  [place] Office: [phone] Pager: [phone]

## 2022-05-12 NOTE — Progress Notes (Signed)
PROGRESS NOTE   Carolyn Russo  PJA:[id number] DOB: 05/29/1951 DOA: 05/10/2022 PCP: [name], MD  Brief Narrative:  71 year old black female ESRD on HD MWF acute basal ganglia CVA left-sided weakness 3/41/9379 complicated by seizures?  05/07/2021 admission DM TY 2 HTN HLD HFrEF Peripheral arterial disease with associated ischemia Lower GI bleed normal EGD 04/19/2019 flex sig hemorrhoids Stool Recent admission 6/23 and 04/17/2022 dry gangrene of right great toe and underwent elective operation as per Dr. Guy Sandifer of podiatry--- return to ED with slightly worsening of the leg despite having completed 2 weeks of cefadroxil 1000 with HD Worsening gangrenous changes to right forefoot WBC 11 platelet 354 BUNs/creatinine 32/7.2 CRP 9.4 potassium 4.3 lactic acid 0.9 MRI obtained Patient started on vancomycin and cefepime  Hospital-Problem based course  Gangrene to right foot Prior PAD-recent elective right great toe amputation Vascular input appreciated-await LE duplex and then decide what kind of amputation required Pain control Oxy IR 5 every 4 as needed moderate pain-Percocet for severe pain Continue vancomycin and cefepime for now ESRD MWF Renal input appreciated-continue Renvela 0.8 x 3 times daily Continue losartan 50 Basal ganglia CVA 04/20/2022 with left-sided weakness Underlying seizures Continue aspirin 81-secondary preventive measures Crestor 20 Continue Keppra 500 at bedtime Prior lower GI bleed-monitor trends  DVT prophylaxis: TED hose Code Status: Full Family Communication: Discussed with Dr. [name] on 7/21 Disposition:  Status is: Inpatient Remains inpatient appropriate because: May require amputation   Consultants:  Renal Vascular  Procedures: Multiple  Antimicrobials: As above   Subjective:  Some confusion overnight-no distress no fever chills n/v  Objective: Vitals:   05/11/22 2000 05/11/22 2031 05/12/22 0600 05/12/22 0836  BP: 131/60 (!) 146/52 (!)  149/55 (!) 175/54  Pulse: 83 85 77 79  Resp: (!) '23 20 20 19  '$ Temp:  98 F (36.7 C) 98.2 F (36.8 C) 98.8 F (37.1 C)  TempSrc:  Oral Oral Oral  SpO2: 98% 100% 100% 97%  Weight:      Height:        Intake/Output Summary (Last 24 hours) at 05/12/2022 1411 Last data filed at 05/12/2022 1248 Gross per 24 hour  Intake 440 ml  Output 2000 ml  Net -1560 ml    Filed Weights   05/10/22 1343 05/11/22 1947  Weight: 82 kg 96.2 kg    Examination:  EOMI NCAT--knows time place year--was more confused earlier in the day apparently S1-S2 no murmur no rub no gallop Chest clear no rales rhonchi No wheeze Abdomen soft nontender Postop changes with gangrene to right foot  Data Reviewed: personally reviewed   CBC    Component Value Date/Time   WBC 11.7 (H) 05/11/2022 0237   RBC 3.01 (L) 05/11/2022 0237   HGB 8.9 (L) 05/11/2022 0237   HGB 9.3 (L) 04/13/2022 1512   HCT 26.3 (L) 05/11/2022 0237   HCT 27.6 (L) 04/13/2022 1512   PLT 338 05/11/2022 0237   PLT 341 04/13/2022 1512   MCV 87.4 05/11/2022 0237   MCV 89 04/13/2022 1512   MCH 29.6 05/11/2022 0237   MCHC 33.8 05/11/2022 0237   RDW 18.8 (H) 05/11/2022 0237   RDW 16.1 (H) 04/13/2022 1512   LYMPHSABS 2.5 05/10/2022 1442   LYMPHSABS 2.4 04/13/2022 1512   MONOABS 1.8 (H) 05/10/2022 1442   EOSABS 0.4 05/10/2022 1442   EOSABS 0.4 04/13/2022 1512   BASOSABS 0.1 05/10/2022 1442   BASOSABS 0.1 04/13/2022 1512      Latest Ref Rng & Units 05/12/2022  3:46 AM 05/11/2022    2:37 AM 05/10/2022    2:42 PM  CMP  Glucose 70 - 99 mg/dL 100  101  136   BUN 8 - 23 mg/dL 22  33  32   Creatinine 0.44 - 1.00 mg/dL 5.07  7.60  7.26   Sodium 135 - 145 mmol/L 135  137  136   Potassium 3.5 - 5.1 mmol/L 4.2  4.5  4.3   Chloride 98 - 111 mmol/L 98  100  94   CO2 22 - 32 mmol/L '26  26  28   '$ Calcium 8.9 - 10.3 mg/dL 8.4  8.8  9.4   Total Protein 6.5 - 8.1 g/dL   7.6   Total Bilirubin 0.3 - 1.2 mg/dL   0.9   Alkaline Phos 38 - 126 U/L   57    AST 15 - 41 U/L   28   ALT 0 - 44 U/L   21      Radiology Studies: MR FOOT RIGHT WO CONTRAST  Result Date: 05/11/2022 CLINICAL DATA:  Increasing right foot pain, swelling and discoloration following right great toe amputation about a month ago. History of diabetes. EXAM: MRI OF THE RIGHT FOREFOOT WITHOUT CONTRAST TECHNIQUE: Multiplanar, multisequence MR imaging of the right forefoot was performed. No intravenous contrast was administered. COMPARISON:  Right foot radiographs 05/10/2022. No other comparison studies. FINDINGS: Despite efforts by the technologist and patient, moderate motion artifact is present on today's exam and could not be eliminated. This reduces exam sensitivity and specificity. Bones/Joint/Cartilage Status post amputation through the mid 1st metatarsal. The amputation margins are sharp, and no suspicious marrow edema or cortical destruction identified in the 1st metatarsal remnant. The additional metatarsals and digits appear normal. The alignment is normal at the Lisfranc joint. There are no significant forefoot joint effusions. Ligaments Intact Lisfranc ligament. The collateral ligaments of the remaining metatarsophalangeal joints appear intact. Muscles and Tendons Mild T2 hyperintensity within the forefoot muscular attributed to diabetes. Small amount of fluid within the abandoned flexor hallucis longus tendon sheath. Soft tissues Generalized forefoot subcutaneous edema without focal fluid collection or unexpected foreign body. Superficial susceptibility artifact may relate to skin stable seen previously. IMPRESSION: 1. Status post recent amputation through the mid 1st metatarsal. No evidence of osteomyelitis or septic arthritis. 2. Nonspecific soft tissue edema throughout the forefoot which could reflect soft tissue infection (cellulitis). No focal fluid collection identified. Electronically Signed   By: Richardean Sale M.D.   On: 05/11/2022 11:28   DG Foot Complete  Right  Result Date: 05/10/2022 CLINICAL DATA:  Partial amputation 1 month ago, pain and warmth EXAM: RIGHT FOOT COMPLETE - 3+ VIEW COMPARISON:  None Available. FINDINGS: Previous amputation across the first metatarsal, with overlying skin staples. There are some subcutaneous gas bubbles just distal to the amputation in the soft tissues. No convincing cortical loss to indicate osteomyelitis. No fracture or dislocation. Diffuse osteopenia. Small calcaneal spur. Extensive arterial calcifications in the foot. IMPRESSION: 1. Previous amputation across the first metatarsal. Regional subcutaneous gas bubbles conceivably postoperative although infection cannot be entirely excluded. 2. No radiographic evidence of osteomyelitis. Electronically Signed   By: Lucrezia Europe M.D.   On: 05/10/2022 16:56     Scheduled Meds:  aspirin  81 mg Oral Daily   carvedilol  3.125 mg Oral BID WC   Chlorhexidine Gluconate Cloth  6 each Topical Q0600   diphenhydrAMINE  25 mg Oral QHS   [START ON 05/13/2022] doxercalciferol  4 mcg Intravenous  Q M,W,F-HD   insulin aspart  0-6 Units Subcutaneous TID WC   insulin glargine-yfgn  8 Units Subcutaneous QHS   levETIRAcetam  500 mg Oral QHS   losartan  50 mg Oral Daily   melatonin  5 mg Oral QHS   methylphenidate  5 mg Oral Daily   mometasone-formoterol  2 puff Inhalation BID   pantoprazole  40 mg Oral Daily   rosuvastatin  20 mg Oral Daily   sevelamer carbonate  0.8 g Oral TID WC   Continuous Infusions:  ceFEPime (MAXIPIME) IV 1 g (05/11/22 2116)   vancomycin Stopped (05/11/22 1947)     LOS: 2 days   Time spent: 81  Nita Sells, MD [facility] To contact the attending provider between 7A-7P or the covering provider during after hours 7P-7A, please log into the web site [email or web address] and access using universal [place] password for that web site. If you do not have the password, please call the hospital operator.  05/12/2022, 2:11 PM

## 2022-05-12 NOTE — Progress Notes (Signed)
Pt receives out-pt HD at [place] on MWF. Pt arrives at 6:20 for 6:40 chair time. Will assist as needed.   Melven Sartorius Renal Navigator [phone]

## 2022-05-13 DIAGNOSIS — T8781 Dehiscence of amputation stump: Secondary | ICD-10-CM | POA: Diagnosis not present

## 2022-05-13 DIAGNOSIS — L089 Local infection of the skin and subcutaneous tissue, unspecified: Secondary | ICD-10-CM | POA: Diagnosis not present

## 2022-05-13 DIAGNOSIS — I96 Gangrene, not elsewhere classified: Secondary | ICD-10-CM

## 2022-05-13 LAB — CBC WITH DIFFERENTIAL/PLATELET
Abs Immature Granulocytes: 0.06 10*3/uL (ref 0.00–0.07)
Basophils Absolute: 0 10*3/uL (ref 0.0–0.1)
Basophils Relative: 0 %
Eosinophils Absolute: 0.5 10*3/uL (ref 0.0–0.5)
Eosinophils Relative: 4 %
HCT: 22.7 % — ABNORMAL LOW (ref 36.0–46.0)
Hemoglobin: 7.7 g/dL — ABNORMAL LOW (ref 12.0–15.0)
Immature Granulocytes: 1 %
Lymphocytes Relative: 20 %
Lymphs Abs: 2.5 10*3/uL (ref 0.7–4.0)
MCH: 28.8 pg (ref 26.0–34.0)
MCHC: 33.9 g/dL (ref 30.0–36.0)
MCV: 85 fL (ref 80.0–100.0)
Monocytes Absolute: 1.9 10*3/uL — ABNORMAL HIGH (ref 0.1–1.0)
Monocytes Relative: 16 %
Neutro Abs: 7.1 10*3/uL (ref 1.7–7.7)
Neutrophils Relative %: 59 %
Platelets: 362 10*3/uL (ref 150–400)
RBC: 2.67 MIL/uL — ABNORMAL LOW (ref 3.87–5.11)
RDW: 18.7 % — ABNORMAL HIGH (ref 11.5–15.5)
WBC: 12.1 10*3/uL — ABNORMAL HIGH (ref 4.0–10.5)
nRBC: 0 % (ref 0.0–0.2)

## 2022-05-13 LAB — GLUCOSE, CAPILLARY
Glucose-Capillary: 65 mg/dL — ABNORMAL LOW (ref 70–99)
Glucose-Capillary: 71 mg/dL (ref 70–99)
Glucose-Capillary: 164 mg/dL — ABNORMAL HIGH (ref 70–99)
Glucose-Capillary: 149 mg/dL — ABNORMAL HIGH (ref 70–99)

## 2022-05-13 LAB — BASIC METABOLIC PANEL
Anion gap: 10 (ref 5–15)
BUN: 28 mg/dL — ABNORMAL HIGH (ref 8–23)
CO2: 25 mmol/L (ref 22–32)
Calcium: 9.2 mg/dL (ref 8.9–10.3)
Chloride: 101 mmol/L (ref 98–111)
Creatinine, Ser: 7.08 mg/dL — ABNORMAL HIGH (ref 0.44–1.00)
GFR, Estimated: 6 mL/min — ABNORMAL LOW (ref >60)
Glucose, Bld: 66 mg/dL — ABNORMAL LOW (ref 70–99)
Potassium: 4.5 mmol/L (ref 3.5–5.1)
Sodium: 136 mmol/L (ref 135–145)

## 2022-05-13 LAB — PHOSPHORUS: Phosphorus: 6 mg/dL — ABNORMAL HIGH (ref 2.5–4.6)

## 2022-05-13 LAB — ALBUMIN: Albumin: 2.5 g/dL — ABNORMAL LOW (ref 3.5–5.0)

## 2022-05-13 MED ORDER — DOXERCALCIFEROL 4 MCG/2ML IV SOLN
2.0000 ug | INTRAVENOUS | Status: DC
  Administered 2022-05-13 – 2022-05-16 (×3): 2 ug via INTRAVENOUS
  Filled 2022-05-13 (×4): qty 2

## 2022-05-13 MED ORDER — ONDANSETRON HCL 4 MG/2ML IJ SOLN
4.0000 mg | Freq: Once | INTRAMUSCULAR | Status: AC
Start: 2022-05-13 — End: 2022-05-13
  Administered 2022-05-13: 4 mg via INTRAVENOUS

## 2022-05-13 MED ORDER — INSULIN GLARGINE-YFGN 100 UNIT/ML ~~LOC~~ SOLN
4.0000 [IU] | Freq: Every day | SUBCUTANEOUS | Status: DC
  Administered 2022-05-13 – 2022-05-21 (×9): 4 [IU] via SUBCUTANEOUS
  Filled 2022-05-13 (×10): qty 0.04

## 2022-05-13 MED ORDER — NEPRO/CARBSTEADY PO LIQD
237.0000 mL | Freq: Two times a day (BID) | ORAL | Status: DC
  Administered 2022-05-14 – 2022-06-10 (×30): 237 mL via ORAL
  Filled 2022-05-13: qty 237

## 2022-05-13 MED ORDER — DARBEPOETIN ALFA 200 MCG/0.4ML IJ SOSY
200.0000 ug | PREFILLED_SYRINGE | INTRAMUSCULAR | Status: DC
  Administered 2022-05-13 – 2022-06-10 (×5): 200 ug via INTRAVENOUS
  Filled 2022-05-13 (×9): qty 0.4

## 2022-05-13 MED ORDER — PROSOURCE PLUS PO LIQD
30.0000 mL | Freq: Two times a day (BID) | ORAL | Status: DC
  Administered 2022-05-13 – 2022-06-10 (×40): 30 mL via ORAL
  Filled 2022-05-13 (×41): qty 30

## 2022-05-13 MED ORDER — ONDANSETRON HCL 4 MG/2ML IJ SOLN
INTRAMUSCULAR | Status: AC
  Filled 2022-05-13: qty 2

## 2022-05-13 MED ORDER — HEPARIN SODIUM (PORCINE) 5000 UNIT/ML IJ SOLN
5000.0000 [IU] | Freq: Three times a day (TID) | INTRAMUSCULAR | Status: DC
  Administered 2022-05-13 – 2022-06-10 (×75): 5000 [IU] via SUBCUTANEOUS
  Filled 2022-05-13 (×79): qty 1

## 2022-05-13 MED ORDER — HYDROMORPHONE HCL 2 MG PO TABS
1.0000 mg | ORAL_TABLET | ORAL | Status: DC | PRN
  Administered 2022-05-13 – 2022-05-15 (×3): 1 mg via ORAL
  Filled 2022-05-13 (×3): qty 1

## 2022-05-13 MED ORDER — HEPARIN SODIUM (PORCINE) 1000 UNIT/ML IJ SOLN
INTRAMUSCULAR | Status: AC
  Administered 2022-05-13: 3000 [IU]
  Filled 2022-05-13: qty 3

## 2022-05-13 NOTE — Plan of Care (Signed)
  Problem: Coping: Goal: Ability to adjust to condition or change in health will improve Outcome: Progressing   

## 2022-05-13 NOTE — Progress Notes (Signed)
Pt arrived to hemodialysis confused, oriented to name, forgetful, oriented to social question intermittently, pt with periods of sleepiness and alertness

## 2022-05-13 NOTE — Progress Notes (Signed)
Subjective: 71 year old female seen today at bedside with her husband.  Complaining of pain to her right foot.  Objective: AAO x3, NAD Overall exam unchanged however the area has dried out there is no drainage or pus.  Extensive necrosis present on the dorsal and plantar aspect of the medial aspect of the foot.  Gangrenous changes present to the fifth toe.  No pain with calf compression, swelling, warmth, erythema   Assessment: Gangrenous changes right foot  Plan: I discussed limb salvage versus amputation with the patient as well as the patient's husband who is at bedside.  Ultimately I do believe that proximal amputation is her best option at this time.  If we try to salvage the foot she only likely have a heel left which likely breakdown and I am not sure that will even heal.  After discussion they are in agreement to proceed with proximal amputation.  Will defer to vascular surgery.  Continue antibiotics for now  Awaiting arterial studies and likely will proceed with proximal amputation.  Until then podiatry will continue to follow.  Continue broad-spectrum antibiotics for now.  Celesta Gentile, DPM

## 2022-05-13 NOTE — Progress Notes (Signed)
Pt BG noted to be 65. Given 4oz cranberry juice. Will recheck

## 2022-05-13 NOTE — Procedures (Signed)
I was present at this dialysis session. I have reviewed the session itself and made appropriate changes. Increased UFG to 3.5L.  Filed Weights   05/10/22 1343 05/11/22 1947 05/13/22 0826  Weight: 82 kg 96.2 kg 89.5 kg    Recent Labs  Lab 05/13/22 0540  NA 136  K 4.5  CL 101  CO2 25  GLUCOSE 66*  BUN 28*  CREATININE 7.08*  CALCIUM 9.2  PHOS 6.0*    Recent Labs  Lab 05/10/22 1442 05/11/22 0237 05/13/22 0540  WBC 11.7* 11.7* 12.1*  NEUTROABS 6.8  --  7.1  HGB 9.6* 8.9* 7.7*  HCT 29.0* 26.3* 22.7*  MCV 87.1 87.4 85.0  PLT 354 338 362    Scheduled Meds:  (feeding supplement) PROSource Plus  30 mL Oral BID BM   aspirin  81 mg Oral Daily   carvedilol  3.125 mg Oral BID WC   Chlorhexidine Gluconate Cloth  6 each Topical Q0600   darbepoetin (ARANESP) injection - DIALYSIS  200 mcg Intravenous Q Fri-HD   diphenhydrAMINE  25 mg Oral QHS   doxercalciferol  2 mcg Intravenous Q M,W,F-HD   feeding supplement (NEPRO CARB STEADY)  237 mL Oral BID BM   insulin aspart  0-6 Units Subcutaneous TID WC   insulin glargine-yfgn  8 Units Subcutaneous QHS   levETIRAcetam  500 mg Oral QHS   losartan  50 mg Oral Daily   melatonin  5 mg Oral QHS   methylphenidate  5 mg Oral Daily   mometasone-formoterol  2 puff Inhalation BID   ondansetron       pantoprazole  40 mg Oral Daily   rosuvastatin  20 mg Oral Daily   sevelamer carbonate  0.8 g Oral TID WC   Continuous Infusions:  ceFEPime (MAXIPIME) IV 1 g (05/12/22 2047)   vancomycin Stopped (05/11/22 1947)   PRN Meds:.acetaminophen, albuterol, alteplase, fluticasone, hydrALAZINE, lidocaine (PF), lidocaine-prilocaine, ondansetron, ondansetron, oxyCODONE, oxyCODONE-acetaminophen, pentafluoroprop-tetrafluoroeth, senna-docusate   Santiago Bumpers,  MD 05/13/2022, 10:43 AM

## 2022-05-13 NOTE — Inpatient Diabetes Management (Signed)
Inpatient Diabetes Program Recommendations  AACE/ADA: New Consensus Statement on Inpatient Glycemic Control (2015)  Target Ranges:  Prepandial:   less than 140 mg/dL      Peak postprandial:   less than 180 mg/dL (1-2 hours)      Critically ill patients:  140 - 180 mg/dL   Lab Results  Component Value Date   GLUCAP 65 (L) 05/13/2022   HGBA1C 5.9 (H) 04/15/2022    Review of Glycemic Control  Latest Reference Range & Units 05/12/22 11:21 05/12/22 16:08 05/12/22 20:31 05/13/22 07:24  Glucose-Capillary 70 - 99 mg/dL 80 148 (H) 111 (H) 65 (L)  (H): Data is abnormally high  Diabetes history: Type 2 DM Outpatient Diabetes medications: Lantus 12 units QHS Current orders for Inpatient glycemic control: Novolog 0-6 units TID Semglee 8 units QHS  Inpatient Diabetes Program Recommendations:    Noted hypoglycemia this AM of 66 mg/dL following Semglee 8 units QHS. Consider discontinuing Semglee.  Thanks, Bronson Curb, MSN, RNC-OB Diabetes Coordinator [phone] (8a-5p)

## 2022-05-13 NOTE — Progress Notes (Signed)
[facility] nephrology NP in department. This rn informed NP pt with frequent reports of pain left foot. NP deferred administering pain med d/t pt drowsiness and pt with esrd

## 2022-05-13 NOTE — Progress Notes (Signed)
PROGRESS NOTE   Carolyn Russo  TOI:[id number] DOB: [birth year] DOA: 05/10/2022 PCP: [name], MD  Brief Narrative:  71 year old black female ESRD on HD MWF acute basal ganglia CVA left-sided weakness 8/33/8250 complicated by seizures?  05/07/2021 admission DM TY 2 HTN HLD HFrEF Peripheral arterial disease with associated ischemia Lower GI bleed normal EGD 04/19/2019 flex sig hemorrhoids Stool Recent admission 6/23 and 04/17/2022 dry gangrene of right great toe and underwent elective operation as per Dr. Guy Sandifer of podiatry--- return to ED with slightly worsening of the leg despite having completed 2 weeks of cefadroxil 1000 with HD Worsening gangrenous changes to right forefoot WBC 11 platelet 354 BUNs/creatinine 32/7.2 CRP 9.4 potassium 4.3 lactic acid 0.9 MRI obtained Patient started on vancomycin and cefepime  She has had some confusion that is baseline  Hospital-Problem based course  Gangrene to right foot Prior PAD-recent elective right great toe amputation Vascular input appreciated-Dr. Sharol Given will see in am as well for discussion about amputation --Dopplers show reasonable good blood flow Stop oxy--use dilaudid for pain.  Monitor mentation Continue vancomycin and cefepime for now ESRD MWF Renal input appreciated-continue Renvela 0.8 x 3 times daily Continue losartan 50 Basal ganglia CVA 04/20/2022 with left-sided weakness Underlying seizures Continue aspirin 81-secondary preventive measures Crestor 20 Continue Keppra 500 at bedtime Hypoglycemia in  known diabetes Cut back Lantus to 4 units and use sliding scale coverage Sugars in the 70s monitor trends overnight Metabolic encephalopathy Seems to have some element of confusion--cahnge pain meds as above and stop melatonin Prior lower GI bleed-monitor trends Anemia of unknown etiology Patient has had slight drop in hemoglobin-transfusion threshold below 8  DVT prophylaxis: TED hose Code Status: Full Family  Communication: Discussed with Dr. [name] on 7/21 Disposition:  Status is: Inpatient Remains inpatient appropriate because: May require amputation   Consultants:  Renal Vascular  Procedures: Multiple  Antimicrobials: As above   Subjective:  Some confusion still no cp fever  Objective: Vitals:   05/13/22 1135 05/13/22 1200 05/13/22 1230 05/13/22 1328  BP: (!) 150/72 (!) 144/68 (!) 143/55 (!) 139/56  Pulse: 87 89 84 79  Resp: '18 19 20 15  '$ Temp:    97.9 F (36.6 C)  TempSrc:      SpO2: 100% 100% 100% 100%  Weight:    85 kg  Height:        Intake/Output Summary (Last 24 hours) at 05/13/2022 1628 Last data filed at 05/13/2022 1328 Gross per 24 hour  Intake 100 ml  Output 3500 ml  Net -3400 ml    Filed Weights   05/11/22 1947 05/13/22 0826 05/13/22 1328  Weight: 96.2 kg 89.5 kg 85 kg    Examination:  EOMI NCAT--mild confusion still S1-S2 no murmur no rub no gallop Chest clear no rales rhonchi No wheeze Abdomen soft nontender Postop changes with gangrene to right foot  Data Reviewed: personally reviewed   CBC    Component Value Date/Time   WBC 12.1 (H) 05/13/2022 0540   RBC 2.67 (L) 05/13/2022 0540   HGB 7.7 (L) 05/13/2022 0540   HGB 9.3 (L) 04/13/2022 1512   HCT 22.7 (L) 05/13/2022 0540   HCT 27.6 (L) 04/13/2022 1512   PLT 362 05/13/2022 0540   PLT 341 04/13/2022 1512   MCV 85.0 05/13/2022 0540   MCV 89 04/13/2022 1512   MCH 28.8 05/13/2022 0540   MCHC 33.9 05/13/2022 0540   RDW 18.7 (H) 05/13/2022 0540   RDW 16.1 (H) 04/13/2022 1512   LYMPHSABS 2.5  05/13/2022 0540   LYMPHSABS 2.4 04/13/2022 1512   MONOABS 1.9 (H) 05/13/2022 0540   EOSABS 0.5 05/13/2022 0540   EOSABS 0.4 04/13/2022 1512   BASOSABS 0.0 05/13/2022 0540   BASOSABS 0.1 04/13/2022 1512      Latest Ref Rng & Units 05/13/2022    5:40 AM 05/12/2022    3:46 AM 05/11/2022    2:37 AM  CMP  Glucose 70 - 99 mg/dL 66  100  101   BUN 8 - 23 mg/dL 28  22  33   Creatinine 0.44 - 1.00  mg/dL 7.08  5.07  7.60   Sodium 135 - 145 mmol/L 136  135  137   Potassium 3.5 - 5.1 mmol/L 4.5  4.2  4.5   Chloride 98 - 111 mmol/L 101  98  100   CO2 22 - 32 mmol/L '25  26  26   '$ Calcium 8.9 - 10.3 mg/dL 9.2  8.4  8.8      Radiology Studies: VAS [place] LOWER EXTREMITY ARTERIAL DUPLEX  Result Date: 05/12/2022 LOWER EXTREMITY ARTERIAL DUPLEX STUDY Patient Name:  Carolyn Russo [facility]  Date of Exam:   05/12/2022 Medical Rec #: [id number]       Accession #:    [id number] Date of Birth: March 31, 1951       Patient Gender: F Patient Age:   59 years Exam Location:  [facility] Procedure:      VAS [place] LOWER EXTREMITY ARTERIAL DUPLEX Referring Phys: Monica Martinez --------------------------------------------------------------------------------  Indications: RLE great toe amputation on 04/15/22 with non healing surgical site              and gangrene. High Risk Factors: Hypertension, hyperlipidemia, Diabetes, no history of                    smoking, coronary artery disease, prior CVA. Other Factors: ESRD (HD), CHF.  Vascular Interventions: S/P right PTA atherectomy and stenting on 02/21/22,. Current ABI:            HX of non-compressible ABIs Limitations: Very difficult exam due to patient moving throughout entire exam. Comparison Study: Previous exam on 03/29/22 showed 30-49% focal stenosis in mid                   PTA Performing Technologist: Jody Hill RVT, RDMS  Examination Guidelines: A complete evaluation includes B-mode imaging, spectral Doppler, color Doppler, and power Doppler as needed of all accessible portions of each vessel. Bilateral testing is considered an integral part of a complete examination. Limited examinations for reoccurring indications may be performed as noted.  +-----------+--------+-----+---------------+----------+--------------+ RIGHT      PSV cm/sRatioStenosis       Waveform  Comments       +-----------+--------+-----+---------------+----------+--------------+ CFA Mid    119                          triphasic                +-----------+--------+-----+---------------+----------+--------------+ DFA        86                          biphasic                 +-----------+--------+-----+---------------+----------+--------------+ SFA Prox   98  triphasic                +-----------+--------+-----+---------------+----------+--------------+ SFA Mid    102                         triphasic                +-----------+--------+-----+---------------+----------+--------------+ SFA Distal 100                         triphasic                +-----------+--------+-----+---------------+----------+--------------+ POP Prox   90                          triphasic                +-----------+--------+-----+---------------+----------+--------------+ POP Distal 130                         biphasic                 +-----------+--------+-----+---------------+----------+--------------+ TP Trunk   125                         triphasic                +-----------+--------+-----+---------------+----------+--------------+ ATA Prox   79                          biphasic                 +-----------+--------+-----+---------------+----------+--------------+ ATA Mid    207     2.62 50-74% stenosisbiphasic                 +-----------+--------+-----+---------------+----------+--------------+ ATA Distal 86                          monophasic               +-----------+--------+-----+---------------+----------+--------------+ PTA Prox   112                         triphasic                +-----------+--------+-----+---------------+----------+--------------+ PTA Mid    185     1.65 30-49% stenosisbiphasic                 +-----------+--------+-----+---------------+----------+--------------+ PTA Distal 77                          monophasic                +-----------+--------+-----+---------------+----------+--------------+ PERO Prox  77                          biphasic                 +-----------+--------+-----+---------------+----------+--------------+ PERO Mid   115                         biphasic                 +-----------+--------+-----+---------------+----------+--------------+ PERO Distal  not visualized +-----------+--------+-----+---------------+----------+--------------+ Unable to visualized and assess stent due to vessel calcification and patient movement.  Summary: Left: 50-74% stenosis noted in the mid portion of the anterior tibial artery. 30-49% stenosis noted in the mid portion of the posterior tibial artery.  See table(s) above for measurements and observations. Electronically signed by Jamelle Haring on 05/12/2022 at 8:24:07 PM.    Final      Scheduled Meds:  (feeding supplement) PROSource Plus  30 mL Oral BID BM   aspirin  81 mg Oral Daily   carvedilol  3.125 mg Oral BID WC   Chlorhexidine Gluconate Cloth  6 each Topical Q0600   darbepoetin (ARANESP) injection - DIALYSIS  200 mcg Intravenous Q Fri-HD   diphenhydrAMINE  25 mg Oral QHS   doxercalciferol  2 mcg Intravenous Q M,W,F-HD   feeding supplement (NEPRO CARB STEADY)  237 mL Oral BID BM   heparin injection (subcutaneous)  5,000 Units Subcutaneous Q8H   insulin aspart  0-6 Units Subcutaneous TID WC   insulin glargine-yfgn  4 Units Subcutaneous QHS   levETIRAcetam  500 mg Oral QHS   losartan  50 mg Oral Daily   methylphenidate  5 mg Oral Daily   mometasone-formoterol  2 puff Inhalation BID   ondansetron       pantoprazole  40 mg Oral Daily   rosuvastatin  20 mg Oral Daily   sevelamer carbonate  0.8 g Oral TID WC   Continuous Infusions:  ceFEPime (MAXIPIME) IV 1 g (05/12/22 2047)   vancomycin Stopped (05/11/22 1947)     LOS: 3 days   Time spent: 71  Nita Sells, MD [facility] To contact  the attending provider between 7A-7P or the covering provider during after hours 7P-7A, please log into the web site [email or web address] and access using universal [place] password for that web site. If you do not have the password, please call the hospital operator.  05/13/2022, 4:28 PM

## 2022-05-13 NOTE — Progress Notes (Signed)
Pharmacy Antibiotic Note  Carolyn Russo is a 71 y.o. female admitted on 05/10/2022 presenting with R-foot infection.  Pharmacy has been consulted for vancomycin and cefepime dosing.  ESRD-HD usually MWF  Plan: Continue Vancomycin 1000 mg IV qHD, dose due to day after HD Continue Cefepime 1g IV q 24h Monitor HD schedule, Cx/intervention recs to narrow Vancomycin random level as needed  Height: 5' 2.5" (158.8 cm) Weight: 96.2 kg (212 lb 1.3 oz) IBW/kg (Calculated) : 51.25  Temp (24hrs), Avg:98.5 F (36.9 C), Min:98.2 F (36.8 C), Max:98.8 F (37.1 C)  Recent Labs  Lab 05/10/22 1442 05/10/22 1546 05/11/22 0237 05/12/22 0346 05/13/22 0540  WBC 11.7*  --  11.7*  --  12.1*  CREATININE 7.26*  --  7.60* 5.07* 7.08*  LATICACIDVEN 0.9 1.0  --   --   --      Estimated Creatinine Clearance: 7.7 mL/min (A) (by C-G formula based on SCr of 7.08 mg/dL (H)).    Allergies  Allergen Reactions   Food Anaphylaxis    Peanuts; Almonds   Wheat Bran     Upset stomach    Statins Itching and Other (See Comments)    Generalized aches- tolerates crestor    Pork-Derived Products     Does not eat pork    Shellfish Allergy Other (See Comments)    Mouth gets raw   Sitagliptin Other (See Comments)    Januiva - unknown reaction    Tetracycline Other (See Comments)    Raw mouth   Contrast Media [Iodinated Contrast Media] Rash    Happened during CT scan over 30 years ago    Aiya Keach A. Levada Dy, PharmD, BCPS, FNKF Clinical Pharmacist [place] Please utilize Amion for appropriate phone number to reach the unit pharmacist ([place])  05/13/2022 8:19 AM

## 2022-05-13 NOTE — Progress Notes (Signed)
[name] Progress Note   Subjective: Seen on HD. Oriented to self only. Says today is the July 7th, doesn't know she is on HD. She hasn't had pain meds this AM. Tolerating HD otherwise without issues. UFG increased to 3.5. Hypertensive on HD.     Objective Vitals:   05/13/22 0729 05/13/22 0743 05/13/22 0826 05/13/22 0855  BP: (!) 177/75  (!) 167/59 (!) 162/70  Pulse: 82  82 80  Resp: 17  18 (!) 22  Temp: 98.5 F (36.9 C)  99 F (37.2 C)   TempSrc: Oral     SpO2: 97% 98% 98% 95%  Weight:   89.5 kg   Height:       Physical Exam General: Chronically ill appearing female agitated now after being awakened. Neuro: Oriented to self only.  Heart: S1,S2 no M/R/G. SR on monitor HR 90s.  Lungs: CTAB anteriorly Abdomen: NABS, NT Extremities: R foot with staples intact from R Toe amp with demarcation and ulceration present. Trace LLE edema.  Dialysis Access: [facility] drsg CDI   Additional Objective Labs: Basic Metabolic Panel: Recent Labs  Lab 05/11/22 0237 05/12/22 0346 05/13/22 0540  NA 137 135 136  K 4.5 4.2 4.5  CL 100 98 101  CO2 '26 26 25  '$ GLUCOSE 101* 100* 66*  BUN 33* 22 28*  CREATININE 7.60* 5.07* 7.08*  CALCIUM 8.8* 8.4* 9.2  PHOS  --   --  6.0*   Liver Function Tests: Recent Labs  Lab 05/10/22 1442 05/13/22 0540  AST 28  --   ALT 21  --   ALKPHOS 57  --   BILITOT 0.9  --   PROT 7.6  --   ALBUMIN 3.1* 2.5*   No results for input(s): "LIPASE", "AMYLASE" in the last 168 hours. CBC: Recent Labs  Lab 05/10/22 1442 05/11/22 0237 05/13/22 0540  WBC 11.7* 11.7* 12.1*  NEUTROABS 6.8  --  7.1  HGB 9.6* 8.9* 7.7*  HCT 29.0* 26.3* 22.7*  MCV 87.1 87.4 85.0  PLT 354 338 362   Blood Culture    Component Value Date/Time   SDES BLOOD RIGHT ANTECUBITAL 05/10/2022 1546   SPECREQUEST  05/10/2022 1546    BOTTLES DRAWN AEROBIC AND ANAEROBIC Blood Culture adequate volume   CULT  05/10/2022 1546    NO GROWTH 3 DAYS Performed at [facility], [place] [address], [place], [place] [address]    REPTSTATUS PENDING 05/10/2022 1546    Cardiac Enzymes: No results for input(s): "CKTOTAL", "CKMB", "CKMBINDEX", "TROPONINI" in the last 168 hours. CBG: Recent Labs  Lab 05/12/22 0822 05/12/22 1121 05/12/22 1608 05/12/22 2031 05/13/22 0724  GLUCAP 85 80 148* 111* 65*   Iron Studies: No results for input(s): "IRON", "TIBC", "TRANSFERRIN", "FERRITIN" in the last 72 hours. '@lablastinr3'$ @ Studies/Results: VAS [place] LOWER EXTREMITY ARTERIAL DUPLEX  Result Date: 05/12/2022 LOWER EXTREMITY ARTERIAL DUPLEX STUDY Patient Name:  Carolyn Russo [facility]  Date of Exam:   05/12/2022 Medical Rec #: [id number]       Accession #:    [id number] Date of Birth: 1951-09-19       Patient Gender: F Patient Age:   71 years Exam Location:  [facility] Procedure:      VAS [place] LOWER EXTREMITY ARTERIAL DUPLEX Referring Phys: Monica Martinez --------------------------------------------------------------------------------  Indications: RLE great toe amputation on 04/15/22 with non healing surgical site              and gangrene. High Risk Factors: Hypertension, hyperlipidemia,  Diabetes, no history of                    smoking, coronary artery disease, prior CVA. Other Factors: ESRD (HD), CHF.  Vascular Interventions: S/P right PTA atherectomy and stenting on 02/21/22,. Current ABI:            HX of non-compressible ABIs Limitations: Very difficult exam due to patient moving throughout entire exam. Comparison Study: Previous exam on 03/29/22 showed 30-49% focal stenosis in mid                   PTA Performing Technologist: Jody Hill RVT, RDMS  Examination Guidelines: A complete evaluation includes B-mode imaging, spectral Doppler, color Doppler, and power Doppler as needed of all accessible portions of each vessel. Bilateral testing is considered an integral part of a complete examination. Limited examinations for reoccurring indications may be performed as noted.   +-----------+--------+-----+---------------+----------+--------------+ RIGHT      PSV cm/sRatioStenosis       Waveform  Comments       +-----------+--------+-----+---------------+----------+--------------+ CFA Mid    119                         triphasic                +-----------+--------+-----+---------------+----------+--------------+ DFA        86                          biphasic                 +-----------+--------+-----+---------------+----------+--------------+ SFA Prox   98                          triphasic                +-----------+--------+-----+---------------+----------+--------------+ SFA Mid    102                         triphasic                +-----------+--------+-----+---------------+----------+--------------+ SFA Distal 100                         triphasic                +-----------+--------+-----+---------------+----------+--------------+ POP Prox   90                          triphasic                +-----------+--------+-----+---------------+----------+--------------+ POP Distal 130                         biphasic                 +-----------+--------+-----+---------------+----------+--------------+ TP Trunk   125                         triphasic                +-----------+--------+-----+---------------+----------+--------------+ ATA Prox   79                          biphasic                 +-----------+--------+-----+---------------+----------+--------------+  ATA Mid    207     2.62 50-74% stenosisbiphasic                 +-----------+--------+-----+---------------+----------+--------------+ ATA Distal 86                          monophasic               +-----------+--------+-----+---------------+----------+--------------+ PTA Prox   112                         triphasic                +-----------+--------+-----+---------------+----------+--------------+ PTA Mid    185     1.65  30-49% stenosisbiphasic                 +-----------+--------+-----+---------------+----------+--------------+ PTA Distal 77                          monophasic               +-----------+--------+-----+---------------+----------+--------------+ PERO Prox  77                          biphasic                 +-----------+--------+-----+---------------+----------+--------------+ PERO Mid   115                         biphasic                 +-----------+--------+-----+---------------+----------+--------------+ PERO Distal                                      not visualized +-----------+--------+-----+---------------+----------+--------------+ Unable to visualized and assess stent due to vessel calcification and patient movement.  Summary: Left: 50-74% stenosis noted in the mid portion of the anterior tibial artery. 30-49% stenosis noted in the mid portion of the posterior tibial artery.  See table(s) above for measurements and observations. Electronically signed by Jamelle Haring on 05/12/2022 at 8:24:07 PM.    Final    MR FOOT RIGHT WO CONTRAST  Result Date: 05/11/2022 CLINICAL DATA:  Increasing right foot pain, swelling and discoloration following right great toe amputation about a month ago. History of diabetes. EXAM: MRI OF THE RIGHT FOREFOOT WITHOUT CONTRAST TECHNIQUE: Multiplanar, multisequence MR imaging of the right forefoot was performed. No intravenous contrast was administered. COMPARISON:  Right foot radiographs 05/10/2022. No other comparison studies. FINDINGS: Despite efforts by the technologist and patient, moderate motion artifact is present on today's exam and could not be eliminated. This reduces exam sensitivity and specificity. Bones/Joint/Cartilage Status post amputation through the mid 1st metatarsal. The amputation margins are sharp, and no suspicious marrow edema or cortical destruction identified in the 1st metatarsal remnant. The additional metatarsals and  digits appear normal. The alignment is normal at the Lisfranc joint. There are no significant forefoot joint effusions. Ligaments Intact Lisfranc ligament. The collateral ligaments of the remaining metatarsophalangeal joints appear intact. Muscles and Tendons Mild T2 hyperintensity within the forefoot muscular attributed to diabetes. Small amount of fluid within the abandoned flexor hallucis longus tendon sheath. Soft tissues Generalized forefoot subcutaneous edema without focal fluid collection or unexpected foreign body. Superficial susceptibility artifact may relate to skin stable  seen previously. IMPRESSION: 1. Status post recent amputation through the mid 1st metatarsal. No evidence of osteomyelitis or septic arthritis. 2. Nonspecific soft tissue edema throughout the forefoot which could reflect soft tissue infection (cellulitis). No focal fluid collection identified. Electronically Signed   By: Richardean Sale M.D.   On: 05/11/2022 11:28   Medications:  ceFEPime (MAXIPIME) IV 1 g (05/12/22 2047)   vancomycin Stopped (05/11/22 1947)    aspirin  81 mg Oral Daily   carvedilol  3.125 mg Oral BID WC   Chlorhexidine Gluconate Cloth  6 each Topical Q0600   diphenhydrAMINE  25 mg Oral QHS   doxercalciferol  4 mcg Intravenous Q M,W,F-HD   insulin aspart  0-6 Units Subcutaneous TID WC   insulin glargine-yfgn  8 Units Subcutaneous QHS   levETIRAcetam  500 mg Oral QHS   losartan  50 mg Oral Daily   melatonin  5 mg Oral QHS   methylphenidate  5 mg Oral Daily   mometasone-formoterol  2 puff Inhalation BID   pantoprazole  40 mg Oral Daily   rosuvastatin  20 mg Oral Daily   sevelamer carbonate  0.8 g Oral TID WC   Dialysis Orders: MWF - [facility] 4 hrs 180NRE 350/700 88.5 kg 2.0 K/2.0 Ca UFP 4  -No heparin -Mircera 200 mcg  IV q 2wks - last 04/29/22 -Venofer '100mg'$  IV X 5 doses-new order, hasn't been given yet -Hectorol 30mg IV TIW  Assessment/Plan: 1. Gangrene R Foot. VVS consulted.  Possible BKA vs AKA. Vanc & Cefepime per primary.  2. ESRD -MWF. Next HD 05/16/2022.  3. Anemia - HGB 7.7. Give max ESA with HD today. Holding Fe load in setting of infection.  4. Secondary hyperparathyroidism - Continue binders, Corrected Ca+ 10.4. Decrease VDRA dose.  5. HTN/volume - Hypertensive this AM. Continue home meds. Increased UFG to 3.5. Lower volume as tolerated.  6. Nutrition - Very low albumin. Add protein supplements.  7. DMT2-per rpimary 8. H/O CVA-ASA, plavix, Keppra   Rip Hawes H. Leilana Mcquire NP-C 05/13/2022, 9:12 AM  C[employer]ell [phone]

## 2022-05-13 NOTE — Progress Notes (Addendum)
  Progress Note    05/13/2022 10:11 AM  Subjective:   confused on dialysis  Vitals:   05/13/22 0932 05/13/22 0946  BP: (!) 181/80   Pulse: 82   Resp: 18   Temp:    SpO2: 98% (!) 88%    Physical Exam: On hd, recognizes me but mostly hollering in pain Right foot gangrene is stable and warm to ankle with ttp throughout both legs  CBC    Component Value Date/Time   WBC 12.1 (H) 05/13/2022 0540   RBC 2.67 (L) 05/13/2022 0540   HGB 7.7 (L) 05/13/2022 0540   HGB 9.3 (L) 04/13/2022 1512   HCT 22.7 (L) 05/13/2022 0540   HCT 27.6 (L) 04/13/2022 1512   PLT 362 05/13/2022 0540   PLT 341 04/13/2022 1512   MCV 85.0 05/13/2022 0540   MCV 89 04/13/2022 1512   MCH 28.8 05/13/2022 0540   MCHC 33.9 05/13/2022 0540   RDW 18.7 (H) 05/13/2022 0540   RDW 16.1 (H) 04/13/2022 1512   LYMPHSABS 2.5 05/13/2022 0540   LYMPHSABS 2.4 04/13/2022 1512   MONOABS 1.9 (H) 05/13/2022 0540   EOSABS 0.5 05/13/2022 0540   EOSABS 0.4 04/13/2022 1512   BASOSABS 0.0 05/13/2022 0540   BASOSABS 0.1 04/13/2022 1512    BMET    Component Value Date/Time   NA 136 05/13/2022 0540   NA 142 04/13/2022 1512   K 4.5 05/13/2022 0540   CL 101 05/13/2022 0540   CO2 25 05/13/2022 0540   GLUCOSE 66 (L) 05/13/2022 0540   BUN 28 (H) 05/13/2022 0540   BUN 12 04/13/2022 1512   CREATININE 7.08 (H) 05/13/2022 0540   CREATININE 0.84 10/13/2011 0903   CALCIUM 9.2 05/13/2022 0540   GFRNONAA 6 (L) 05/13/2022 0540   GFRNONAA 75 10/13/2011 0903   GFRAA 17 06/19/2020 0000   GFRAA 86 10/13/2011 0903    INR    Component Value Date/Time   INR 1.2 05/10/2022 1442     Intake/Output Summary (Last 24 hours) at 05/13/2022 1011 Last data filed at 05/13/2022 0000 Gross per 24 hour  Intake 100 ml  Output 300 ml  Net -200 ml     Assessment:  71 y.o. female is here with gangrenous changes of right foot amputation and more proximally.  Plan: Will need at least R bka vs aka. I have discussed with patient but she is  confused and also with her husband again today. He is reluctant to sign consent without her understanding. She could have bka today vs aka early next week when patient and family amenable.   Carolyn Russo C. [name], MD [facility] Office: [phone] Pager: [phone]  05/13/2022 10:11 AM

## 2022-05-13 NOTE — Progress Notes (Addendum)
Received patient in bed, alert and oriented. Informed consent signed and in chart.  Time tx completed: 1300   HD treatment completed. Patient tolerated well. Right HD catheter without signs and symptoms of complications. Patient transported back to the room, alert and orient and in no acute distress. Report given to bedside RN.  Total UF removed: 3.5L  Medication given:  Zofran   Post HD VS:  BP 139/56 Resp 15 HR 79 Temp 97.9   Post HD weight: 85 kg

## 2022-05-14 DIAGNOSIS — L089 Local infection of the skin and subcutaneous tissue, unspecified: Secondary | ICD-10-CM | POA: Diagnosis not present

## 2022-05-14 DIAGNOSIS — I96 Gangrene, not elsewhere classified: Secondary | ICD-10-CM | POA: Diagnosis not present

## 2022-05-14 LAB — GLUCOSE, CAPILLARY
Glucose-Capillary: 96 mg/dL (ref 70–99)
Glucose-Capillary: 132 mg/dL — ABNORMAL HIGH (ref 70–99)
Glucose-Capillary: 134 mg/dL — ABNORMAL HIGH (ref 70–99)
Glucose-Capillary: 173 mg/dL — ABNORMAL HIGH (ref 70–99)

## 2022-05-14 LAB — RENAL FUNCTION PANEL
Albumin: 2.4 g/dL — ABNORMAL LOW (ref 3.5–5.0)
Anion gap: 12 (ref 5–15)
BUN: 21 mg/dL (ref 8–23)
CO2: 26 mmol/L (ref 22–32)
Calcium: 8.5 mg/dL — ABNORMAL LOW (ref 8.9–10.3)
Chloride: 96 mmol/L — ABNORMAL LOW (ref 98–111)
Creatinine, Ser: 5.24 mg/dL — ABNORMAL HIGH (ref 0.44–1.00)
GFR, Estimated: 8 mL/min — ABNORMAL LOW (ref >60)
Glucose, Bld: 105 mg/dL — ABNORMAL HIGH (ref 70–99)
Phosphorus: 4.5 mg/dL (ref 2.5–4.6)
Potassium: 4.3 mmol/L (ref 3.5–5.1)
Sodium: 134 mmol/L — ABNORMAL LOW (ref 135–145)

## 2022-05-14 LAB — CBC
HCT: 25.1 % — ABNORMAL LOW (ref 36.0–46.0)
Hemoglobin: 8.4 g/dL — ABNORMAL LOW (ref 12.0–15.0)
MCH: 28.3 pg (ref 26.0–34.0)
MCHC: 33.5 g/dL (ref 30.0–36.0)
MCV: 84.5 fL (ref 80.0–100.0)
Platelets: 127 10*3/uL — ABNORMAL LOW (ref 150–400)
RBC: 2.97 MIL/uL — ABNORMAL LOW (ref 3.87–5.11)
RDW: 18.6 % — ABNORMAL HIGH (ref 11.5–15.5)
WBC: 13.5 10*3/uL — ABNORMAL HIGH (ref 4.0–10.5)
nRBC: 0 % (ref 0.0–0.2)

## 2022-05-14 MED ORDER — VANCOMYCIN HCL IN DEXTROSE 1-5 GM/200ML-% IV SOLN
1000.0000 mg | Freq: Once | INTRAVENOUS | Status: AC
  Administered 2022-05-14: 1000 mg via INTRAVENOUS
  Filled 2022-05-14: qty 200

## 2022-05-14 NOTE — Plan of Care (Signed)

## 2022-05-14 NOTE — Progress Notes (Signed)
PROGRESS NOTE   Carolyn Russo  JHE:[id number] DOB: 05/14/1951 DOA: 05/10/2022 PCP: [name], MD  Brief Narrative:  71 year old black female ESRD on HD MWF acute basal ganglia CVA left-sided weakness 1/85/6314 complicated by seizures?  05/07/2021 admission DM TY 2 HTN HLD HFrEF Peripheral arterial disease with associated ischemia Lower GI bleed normal EGD 04/19/2019 flex sig hemorrhoids Stool Recent admission 6/23 and 04/17/2022 dry gangrene of right great toe and underwent elective operation as per Dr. Guy Sandifer of podiatry--- return to ED with slightly worsening of the leg despite having completed 2 weeks of cefadroxil 1000 with HD Worsening gangrenous changes to right forefoot WBC 11 platelet 354 BUNs/creatinine 32/7.2 CRP 9.4 potassium 4.3 lactic acid 0.9 MRI obtained Patient started on vancomycin and cefepime  She has had some confusion that is baseline  Hospital-Problem based course  Gangrene to right foot Prior PAD-recent elective right great toe amputation Vascular input appreciated-await further discussion between orthopedics and family Stop oxy--use dilaudid for pain.  Mentation is improved with change to oxy Continue vancomycin and cefepime for now-monitor leukocytosis ESRD MWF Renal input appreciated-continue Renvela 0.8 x 3 times daily Continue losartan 50 Basal ganglia CVA 04/20/2022 with left-sided weakness Underlying seizures Continue aspirin 81-secondary preventive measures Crestor 20 Continue Keppra 500 at bedtime Hypoglycemia in  known diabetes Cut back Lantus to 4 units and use sliding scale coverage Sugars improved 97W to 263Z Metabolic encephalopathy Seems to have some element of confusion--encephalopathy is better with changed from oxygen to Dilaudid and discontinuation of melatonin Prior lower GI bleed-monitor trends Anemia of unknown etiology Patient has had slight drop in hemoglobin-transfusion threshold below 8  DVT prophylaxis: TED hose Code  Status: Full Family Communication: Discussed with Dr. [name] on 7/22 Disposition:  Status is: Inpatient Remains inpatient appropriate because: May require amputation   Consultants:  Renal Vascular  Procedures: Multiple  Antimicrobials: As above   Subjective:  Less confused  Objective: Vitals:   05/14/22 0409 05/14/22 0834 05/14/22 0903 05/14/22 1235  BP: (!) 159/77  (!) 161/69 (!) 149/51  Pulse: 75  75 76  Resp:   20   Temp: 98.4 F (36.9 C)  98.2 F (36.8 C) 98.4 F (36.9 C)  TempSrc: Oral  Oral Oral  SpO2: 100% 100% 100% 96%  Weight:      Height:        Intake/Output Summary (Last 24 hours) at 05/14/2022 1326 Last data filed at 05/14/2022 0300 Gross per 24 hour  Intake 505.6 ml  Output 3500 ml  Net -2994.4 ml    Filed Weights   05/11/22 1947 05/13/22 0826 05/13/22 1328  Weight: 96.2 kg 89.5 kg 85 kg    Examination:  EOMI NCAT S1-S2 no murmur no rub no gallop Chest clear no rales rhonchi No wheeze Abdomen soft nontender Gangrene to RLE noted--no oozing  Data Reviewed: personally reviewed   CBC    Component Value Date/Time   WBC 13.5 (H) 05/14/2022 0347   RBC 2.97 (L) 05/14/2022 0347   HGB 8.4 (L) 05/14/2022 0347   HGB 9.3 (L) 04/13/2022 1512   HCT 25.1 (L) 05/14/2022 0347   HCT 27.6 (L) 04/13/2022 1512   PLT 127 (L) 05/14/2022 0347   PLT 341 04/13/2022 1512   MCV 84.5 05/14/2022 0347   MCV 89 04/13/2022 1512   MCH 28.3 05/14/2022 0347   MCHC 33.5 05/14/2022 0347   RDW 18.6 (H) 05/14/2022 0347   RDW 16.1 (H) 04/13/2022 1512   LYMPHSABS 2.5 05/13/2022 0540   LYMPHSABS  2.4 04/13/2022 1512   MONOABS 1.9 (H) 05/13/2022 0540   EOSABS 0.5 05/13/2022 0540   EOSABS 0.4 04/13/2022 1512   BASOSABS 0.0 05/13/2022 0540   BASOSABS 0.1 04/13/2022 1512      Latest Ref Rng & Units 05/14/2022    3:47 AM 05/13/2022    5:40 AM 05/12/2022    3:46 AM  CMP  Glucose 70 - 99 mg/dL 105  66  100   BUN 8 - 23 mg/dL '21  28  22   '$ Creatinine 0.44 - 1.00  mg/dL 5.24  7.08  5.07   Sodium 135 - 145 mmol/L 134  136  135   Potassium 3.5 - 5.1 mmol/L 4.3  4.5  4.2   Chloride 98 - 111 mmol/L 96  101  98   CO2 22 - 32 mmol/L '26  25  26   '$ Calcium 8.9 - 10.3 mg/dL 8.5  9.2  8.4      Radiology Studies: VAS [place] LOWER EXTREMITY ARTERIAL DUPLEX  Result Date: 05/12/2022 LOWER EXTREMITY ARTERIAL DUPLEX STUDY Patient Name:  Carolyn Russo [facility]  Date of Exam:   05/12/2022 Medical Rec #: [id number]       Accession #:    [id number] Date of Birth: 03-03-1951       Patient Gender: F Patient Age:   27 years Exam Location:  [facility] Procedure:      VAS [place] LOWER EXTREMITY ARTERIAL DUPLEX Referring Phys: Monica Martinez --------------------------------------------------------------------------------  Indications: RLE great toe amputation on 04/15/22 with non healing surgical site              and gangrene. High Risk Factors: Hypertension, hyperlipidemia, Diabetes, no history of                    smoking, coronary artery disease, prior CVA. Other Factors: ESRD (HD), CHF.  Vascular Interventions: S/P right PTA atherectomy and stenting on 02/21/22,. Current ABI:            HX of non-compressible ABIs Limitations: Very difficult exam due to patient moving throughout entire exam. Comparison Study: Previous exam on 03/29/22 showed 30-49% focal stenosis in mid                   PTA Performing Technologist: Jody Hill RVT, RDMS  Examination Guidelines: A complete evaluation includes B-mode imaging, spectral Doppler, color Doppler, and power Doppler as needed of all accessible portions of each vessel. Bilateral testing is considered an integral part of a complete examination. Limited examinations for reoccurring indications may be performed as noted.  +-----------+--------+-----+---------------+----------+--------------+ RIGHT      PSV cm/sRatioStenosis       Waveform  Comments       +-----------+--------+-----+---------------+----------+--------------+ CFA Mid    119                          triphasic                +-----------+--------+-----+---------------+----------+--------------+ DFA        86                          biphasic                 +-----------+--------+-----+---------------+----------+--------------+ SFA Prox   98                          triphasic                +-----------+--------+-----+---------------+----------+--------------+  SFA Mid    102                         triphasic                +-----------+--------+-----+---------------+----------+--------------+ SFA Distal 100                         triphasic                +-----------+--------+-----+---------------+----------+--------------+ POP Prox   90                          triphasic                +-----------+--------+-----+---------------+----------+--------------+ POP Distal 130                         biphasic                 +-----------+--------+-----+---------------+----------+--------------+ TP Trunk   125                         triphasic                +-----------+--------+-----+---------------+----------+--------------+ ATA Prox   79                          biphasic                 +-----------+--------+-----+---------------+----------+--------------+ ATA Mid    207     2.62 50-74% stenosisbiphasic                 +-----------+--------+-----+---------------+----------+--------------+ ATA Distal 86                          monophasic               +-----------+--------+-----+---------------+----------+--------------+ PTA Prox   112                         triphasic                +-----------+--------+-----+---------------+----------+--------------+ PTA Mid    185     1.65 30-49% stenosisbiphasic                 +-----------+--------+-----+---------------+----------+--------------+ PTA Distal 77                          monophasic                +-----------+--------+-----+---------------+----------+--------------+ PERO Prox  77                          biphasic                 +-----------+--------+-----+---------------+----------+--------------+ PERO Mid   115                         biphasic                 +-----------+--------+-----+---------------+----------+--------------+ PERO Distal  not visualized +-----------+--------+-----+---------------+----------+--------------+ Unable to visualized and assess stent due to vessel calcification and patient movement.  Summary: Left: 50-74% stenosis noted in the mid portion of the anterior tibial artery. 30-49% stenosis noted in the mid portion of the posterior tibial artery.  See table(s) above for measurements and observations. Electronically signed by Jamelle Haring on 05/12/2022 at 8:24:07 PM.    Final      Scheduled Meds:  (feeding supplement) PROSource Plus  30 mL Oral BID BM   aspirin  81 mg Oral Daily   carvedilol  3.125 mg Oral BID WC   Chlorhexidine Gluconate Cloth  6 each Topical Q0600   darbepoetin (ARANESP) injection - DIALYSIS  200 mcg Intravenous Q Fri-HD   diphenhydrAMINE  25 mg Oral QHS   doxercalciferol  2 mcg Intravenous Q M,W,F-HD   feeding supplement (NEPRO CARB STEADY)  237 mL Oral BID BM   heparin injection (subcutaneous)  5,000 Units Subcutaneous Q8H   insulin aspart  0-6 Units Subcutaneous TID WC   insulin glargine-yfgn  4 Units Subcutaneous QHS   levETIRAcetam  500 mg Oral QHS   losartan  50 mg Oral Daily   methylphenidate  5 mg Oral Daily   mometasone-formoterol  2 puff Inhalation BID   pantoprazole  40 mg Oral Daily   rosuvastatin  20 mg Oral Daily   sevelamer carbonate  0.8 g Oral TID WC   Continuous Infusions:  ceFEPime (MAXIPIME) IV 1 g (05/13/22 2035)   vancomycin Stopped (05/11/22 1947)   vancomycin       LOS: 4 days   Time spent: 32  Nita Sells, MD [facility] To contact the  attending provider between 7A-7P or the covering provider during after hours 7P-7A, please log into the web site [email or web address] and access using universal [place] password for that web site. If you do not have the password, please call the hospital operator.  05/14/2022, 1:26 PM

## 2022-05-14 NOTE — H&P (View-Only) (Signed)
Patient ID: Carolyn Russo, female   DOB: September 19, 1951, 71 y.o.   MRN: [id number]   ORTHOPAEDIC CONSULTATION  REQUESTING PHYSICIAN: Nita Sells, MD  Chief Complaint: Ischemic pain right foot.  HPI: Carolyn Russo is a 71 y.o. female who presents with dry gangrenous changes right forefoot.  Patient is status post limb salvage intervention with endovascular revascularization with Dr. [name] about 2 months ago and status post a first ray amputation about a month ago.  Patient has progressive dry gangrenous changes to the right forefoot and increasing ischemic pain.  Past Medical History:  Diagnosis Date   Acute GI bleeding    Allergy    Anemia    Anterior chest wall pain    Appendicitis 1965   Asthma    Body mass index 37.0-37.9, adult    Breast pain    Cataract    both eyes   CHF (congestive heart failure) ([place])    Chronic kidney disease    stage 5 - on dialysis   Cognitive change 04/20/2021   r/t cva 01/6269   Complication of anesthesia    Memory loss after general   Dehydration 2014   Deviated septum 1971   Diabetes mellitus    Dysphagia due to old stroke    easy to get strangled when eating   Dyspnea 2014   Extrinsic asthma    WITH ASTHMA ATTACK   Fibroid 1980   GERD (gastroesophageal reflux disease)    Heart murmur    History of migraine    History of seizure    with stroke   Hx gestational diabetes    Hyperlipidemia    Hypertension 2014   Inguinal hernia 1959   Malaise and fatigue 2014   Non-IgE mediated allergic asthma 2014   Obesity    Pelvic pain    Pregnancy, high-risk 1985   Stroke ([place]) 04/20/2021   (CVA) of right basal ganglia   Tonsillitis 1968   Uterine fibroid 1980   Visual field defect    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   Past Surgical History:  Procedure Laterality Date   ABDOMINAL AORTOGRAM W/LOWER EXTREMITY N/A 02/21/2022   Procedure: ABDOMINAL AORTOGRAM W/LOWER EXTREMITY;  Surgeon: Waynetta Sandy, MD;  Location: [facility];   Service: Cardiovascular;  Laterality: N/A;   AMPUTATION TOE Right 04/15/2022   Procedure: AMPUTATION  LST TOERIGHT FOOT;  Surgeon: Criselda Peaches, DPM;  Location: [facility];  Service: Podiatry;  Laterality: Right;   APPENDECTOMY  3500   BASCILIC VEIN TRANSPOSITION Right 04/30/2021   Procedure: RIGHT FIRST STAGE [place];  Surgeon: Angelia Mould, MD;  Location: [facility];  Service: Vascular;  Laterality: Right;   CESAREAN SECTION  1985   COLONOSCOPY     ESOPHAGOGASTRODUODENOSCOPY (EGD) WITH PROPOFOL N/A 04/18/2021   Procedure: ESOPHAGOGASTRODUODENOSCOPY (EGD) WITH PROPOFOL;  Surgeon: Gatha Mayer, MD;  Location: [facility];  Service: Endoscopy;  Laterality: N/A;   EYE SURGERY     bilateral cataract    FLEXIBLE SIGMOIDOSCOPY N/A 04/18/2021   Procedure: FLEXIBLE SIGMOIDOSCOPY;  Surgeon: Gatha Mayer, MD;  Location: [facility];  Service: Endoscopy;  Laterality: N/A;   HERNIA REPAIR  1959   IR FLUORO GUIDE CV LINE RIGHT  03/18/2021   IR US GUIDE VASC ACCESS RIGHT  03/18/2021   LEFT HEART CATH AND CORONARY ANGIOGRAPHY N/A 04/04/2017   Procedure: Left Heart Cath and Coronary Angiography;  Surgeon: Belva Crome, MD;  Location: [facility];  Service: Cardiovascular;  Laterality: N/A;   [place], 2004, 2007   PERIPHERAL VASCULAR THROMBECTOMY  02/21/2022   Procedure: PERIPHERAL VASCULAR THROMBECTOMY;  Surgeon: Waynetta Sandy, MD;  Location: [name];  Service: Cardiovascular;;  Right Tibial   RHINOPLASTY  1971   ROTATOR CUFF REPAIR  2003   SURGICAL REPAIR OF HEMORRHAGE  2015   TONSILLECTOMY  1968   Social History   Socioeconomic History   Marital status: Married    Spouse name: [name]   Number of children: 1   Years of education: Not on file   Highest education level: Professional school degree (e.g., MD, DDS, DVM, JD)  Occupational History   Occupation: retired  Tobacco Use   Smoking status: Never   Smokeless tobacco: Never   Vaping Use   Vaping Use: Never used  Substance and Sexual Activity   Alcohol use: No   Drug use: No   Sexual activity: Not Currently    Birth control/protection: Post-menopausal  Other Topics Concern   Not on file  Social History Narrative   06/21/21 lives with husband Dr Marylynn Pearson, caregiver- Zigmund Daniel   Patient reports she used to walk at [employer] but now due to Covid-19 she is staying inside.   Social Determinants of Health   Financial Resource Strain: Low Risk  (09/02/2021)   Overall Financial Resource Strain (CARDIA)    Difficulty of Paying Living Expenses: Not hard at all  Food Insecurity: No Food Insecurity (09/02/2021)   Hunger Vital Sign    Worried About Running Out of Food in the Last Year: Never true    Ran Out of Food in the Last Year: Never true  Transportation Needs: No Transportation Needs (09/02/2021)   PRAPARE - Hydrologist (Medical): No    Lack of Transportation (Non-Medical): No  Physical Activity: Inactive (09/02/2021)   Exercise Vital Sign    Days of Exercise per Week: 0 days    Minutes of Exercise per Session: 0 min  Stress: Stress Concern Present (09/02/2021)   [place]    Feeling of Stress : To some extent  Social Connections: Socially Integrated (01/09/2019)   Social Connection and Isolation Panel [NHANES]    Frequency of Communication with Friends and Family: More than three times a week    Frequency of Social Gatherings with Friends and Family: More than three times a week    Attends Religious Services: More than 4 times per year    Active Member of [employer] or Organizations: Yes    Attends Music therapist: More than 4 times per year    Marital Status: Married   Family History  Problem Relation Age of Onset   Cancer Mother        abdominal melamona   Psoriasis Mother    Alzheimer's disease Father    Cancer Cousin        colon    Diabetes  Cousin    Diabetes Maternal Aunt    Colon cancer Neg Hx    Colon polyps Neg Hx    Esophageal cancer Neg Hx    Rectal cancer Neg Hx    Stomach cancer Neg Hx    Breast cancer Neg Hx    - negative except otherwise stated in the family history section Allergies  Allergen Reactions   Food Anaphylaxis    Peanuts; Almonds   Wheat Bran     Upset stomach    Statins Itching  and Other (See Comments)    Generalized aches- tolerates crestor    Pork-Derived Products     Does not eat pork    Shellfish Allergy Other (See Comments)    Mouth gets raw   Sitagliptin Other (See Comments)    Januiva - unknown reaction    Tetracycline Other (See Comments)    Raw mouth   Contrast Media [Iodinated Contrast Media] Rash    Happened during CT scan over 30 years ago   Prior to Admission medications   Medication Sig Start Date End Date Taking? Authorizing Provider  acetaminophen (TYLENOL) 500 MG tablet Take 1,000 mg by mouth 2 (two) times daily as needed (leg and arm pain).   Yes [provider]  albuterol (VENTOLIN HFA) 108 (90 Base) MCG/ACT inhaler Inhale 2 puffs into the lungs every 4 (four) hours as needed for wheezing or shortness of breath. 04/13/22  Yes Minette Brine, FNP  Alcaftadine (LASTACAFT) 0.25 % SOLN Place 1 drop into both eyes daily as needed (itching).   Yes [provider]  ARTIFICIAL TEAR SOLUTION OP Place 1 drop into both eyes daily as needed (dry eyes).   Yes [provider]  carvedilol (COREG) 3.125 MG tablet Take 1 tablet (3.125 mg total) by mouth 2 (two) times daily with a meal. 11/01/21  Yes [name], MD  clopidogrel (PLAVIX) 75 MG tablet TAKE 1 TABLET(75 MG) BY MOUTH DAILY 02/11/22  Yes [name], MD  diclofenac Sodium (VOLTAREN) 1 % GEL Apply 2 g topically 4 (four) times daily. To left wrist Patient taking differently: Apply 2 g topically daily as needed (pain). To left wrist 05/26/21  Yes Love, Ivan Anchors, PA-C  diphenhydrAMINE (BENADRYL) 2 % cream  Apply 1 application. topically daily as needed for itching.   Yes [provider]  diphenhydrAMINE (BENADRYL) 25 MG tablet Take 25 mg by mouth at bedtime.   Yes [provider]  fluticasone (FLONASE) 50 MCG/ACT nasal spray Place 2 sprays into the nose daily as needed for allergies.    Yes [provider]  Insulin Aspart, w/Niacinamide, (FIASP) 100 UNIT/ML SOLN Inject 6-16 Units as directed 3 (three) times daily as needed (high blood sugar). Sliding scale 100-150=6 units 151-200=10 units 201-250=12 units 251-300=14 units 301-350=16 units Greater than 350 = 16 units   Yes [provider]  insulin glargine (LANTUS) 100 UNIT/ML Solostar Pen Inject 13 Units into the skin daily. Patient taking differently: Inject 12 Units into the skin at bedtime. 08/18/21  Yes Ghumman, Ramandeep, NP  levETIRAcetam (KEPPRA) 500 MG tablet TAKE 1 TABLET(500 MG) BY MOUTH AT BEDTIME 01/07/22  Yes Lovorn, Megan, MD  Lidocaine 4 % GEL Apply 1 Application topically daily as needed (pain).   Yes [provider]  losartan (COZAAR) 50 MG tablet Take 50 mg by mouth daily. 01/21/22  Yes [provider]  melatonin 5 MG TABS Take 5 mg by mouth at bedtime.   Yes [provider]  methylphenidate (RITALIN) 5 MG tablet Take 1 tablet (5 mg total) by mouth 2 (two) times daily with breakfast and lunch. Due to stroke attention Patient taking differently: Take 5 mg by mouth daily. Due to stroke attention 03/04/22  Yes Lovorn, Jinny Blossom, MD  mometasone-formoterol (DULERA) 200-5 MCG/ACT AERO Inhale 2 puffs into the lungs 2 (two) times daily. 04/15/22  Yes [name], MD  ondansetron (ZOFRAN) 8 MG tablet Take 8 mg by mouth 2 (two) times daily as needed for refractory nausea / vomiting.   Yes [provider]  ondansetron (ZOFRAN-ODT) 4 MG disintegrating tablet Take 1 tablet (4 mg total) by mouth every 8 (eight) hours as needed for nausea or vomiting. 03/07/22  Yes Curatolo, Adam,  DO  oxyCODONE (OXY IR/ROXICODONE) 5 MG immediate release tablet Take 1 tablet (5 mg total) by mouth every 4 (four) hours as needed for up to 7 days for severe pain. 05/09/22 05/16/22 Yes McDonald, Stephan Minister, DPM  pantoprazole (PROTONIX) 40 MG tablet Take 1 tablet (40 mg total) by mouth daily. 02/07/22  Yes [name], MD  rosuvastatin (CRESTOR) 20 MG tablet Take 1 tablet (20 mg total) by mouth daily. 08/18/21  Yes Ghumman, Ramandeep, NP  senna-docusate (SENOKOT-S) 8.6-50 MG tablet Take 2 tablets by mouth 2 (two) times daily. Patient taking differently: Take 1-2 tablets by mouth daily as needed for mild constipation or moderate constipation. 06/29/21  Yes Ghumman, Ramandeep, NP  sevelamer carbonate (RENVELA) 0.8 g PACK packet Take 0.8 g by mouth 3 (three) times daily with meals. 10/19/21  Yes [provider]  cefadroxil (DURICEF) 500 MG capsule Take 2 capsules (1,000 mg total) by mouth every Monday, Wednesday, and Friday with hemodialysis. 05/04/22   Criselda Peaches, DPM  Continuous Blood Gluc Receiver (FREESTYLE LIBRE 2 READER) DEVI by Does not apply route.    [provider]  hydrALAZINE (APRESOLINE) 10 MG tablet Take 1 tablet (10 mg total) by mouth every 8 (eight) hours as needed (Sys BP >180, hold on HD days). Patient not taking: Reported on 05/10/2022 05/26/21   Love, Ivan Anchors, PA-C  hydrocortisone (ANUSOL-HC) 2.5 % rectal cream Place 1 application rectally 2 (two) times daily as needed for hemorrhoids or anal itching. Patient not taking: Reported on 05/10/2022 04/20/21   Jonetta Osgood, MD  Insulin Pen Needle (BD PEN NEEDLE NANO 2ND GEN) 32G X 4 MM MISC See admin instructions. 07/15/21   [provider]  iron sucrose in sodium chloride 0.9 % 100 mL Inject 100 mg into the vein as needed (based on labs). 06/16/21   [provider]  Methoxy PEG-Epoetin Beta (MIRCERA IJ) Mircera 07/21/21 07/20/22  [provider]  mupirocin ointment (BACTROBAN) 2 % Apply 1  application. topically 2 (two) times daily. Patient not taking: Reported on 05/10/2022 02/01/22   Criselda Peaches, DPM  Nutritional Supplements (,FEEDING SUPPLEMENT, PROSOURCE PLUS) liquid Take 30 mLs by mouth 2 (two) times daily between meals. Patient not taking: Reported on 04/14/2022 05/27/21   Love, Ivan Anchors, PA-C  OneTouch Delica Lancets 95M MISC Use as directed to check blood sugars before breakfast and dinner dx: e11.65 08/18/21   Ghumman, Milford Cage, NP  traMADol (ULTRAM) 50 MG tablet TAKE 1 TABLET(50 MG) BY MOUTH EVERY 12 HOURS FOR 5 DAYS Patient not taking: Reported on 05/10/2022 03/28/22   Criselda Peaches, DPM  iron polysaccharides (NIFEREX) 150 MG capsule Take 1 capsule (150 mg total) by mouth daily. Patient not taking: No sig reported 03/24/21 04/16/21  Nita Sells, MD   VAS [place] LOWER EXTREMITY ARTERIAL DUPLEX  Result Date: 05/12/2022 LOWER EXTREMITY ARTERIAL DUPLEX STUDY Patient Name:  NYELLE WOLFSON [facility]  Date of Exam:   05/12/2022 Medical Rec #: [id number]       Accession #:    [id number] Date of Birth: 06/20/1951       Patient Gender: F Patient Age:   42 years Exam Location:  [facility] Procedure:      VAS [place] LOWER EXTREMITY ARTERIAL DUPLEX Referring Phys: Monica Martinez --------------------------------------------------------------------------------  Indications: RLE  great toe amputation on 04/15/22 with non healing surgical site              and gangrene. High Risk Factors: Hypertension, hyperlipidemia, Diabetes, no history of                    smoking, coronary artery disease, prior CVA. Other Factors: ESRD (HD), CHF.  Vascular Interventions: S/P right PTA atherectomy and stenting on 02/21/22,. Current ABI:            HX of non-compressible ABIs Limitations: Very difficult exam due to patient moving throughout entire exam. Comparison Study: Previous exam on 03/29/22 showed 30-49% focal stenosis in mid                   PTA Performing Technologist: Jody Hill RVT, RDMS  Examination  Guidelines: A complete evaluation includes B-mode imaging, spectral Doppler, color Doppler, and power Doppler as needed of all accessible portions of each vessel. Bilateral testing is considered an integral part of a complete examination. Limited examinations for reoccurring indications may be performed as noted.  +-----------+--------+-----+---------------+----------+--------------+ RIGHT      PSV cm/sRatioStenosis       Waveform  Comments       +-----------+--------+-----+---------------+----------+--------------+ CFA Mid    119                         triphasic                +-----------+--------+-----+---------------+----------+--------------+ DFA        86                          biphasic                 +-----------+--------+-----+---------------+----------+--------------+ SFA Prox   98                          triphasic                +-----------+--------+-----+---------------+----------+--------------+ SFA Mid    102                         triphasic                +-----------+--------+-----+---------------+----------+--------------+ SFA Distal 100                         triphasic                +-----------+--------+-----+---------------+----------+--------------+ POP Prox   90                          triphasic                +-----------+--------+-----+---------------+----------+--------------+ POP Distal 130                         biphasic                 +-----------+--------+-----+---------------+----------+--------------+ TP Trunk   125                         triphasic                +-----------+--------+-----+---------------+----------+--------------+ ATA Prox   79  biphasic                 +-----------+--------+-----+---------------+----------+--------------+ ATA Mid    207     2.62 50-74% stenosisbiphasic                 +-----------+--------+-----+---------------+----------+--------------+  ATA Distal 86                          monophasic               +-----------+--------+-----+---------------+----------+--------------+ PTA Prox   112                         triphasic                +-----------+--------+-----+---------------+----------+--------------+ PTA Mid    185     1.65 30-49% stenosisbiphasic                 +-----------+--------+-----+---------------+----------+--------------+ PTA Distal 77                          monophasic               +-----------+--------+-----+---------------+----------+--------------+ PERO Prox  77                          biphasic                 +-----------+--------+-----+---------------+----------+--------------+ PERO Mid   115                         biphasic                 +-----------+--------+-----+---------------+----------+--------------+ PERO Distal                                      not visualized +-----------+--------+-----+---------------+----------+--------------+ Unable to visualized and assess stent due to vessel calcification and patient movement.  Summary: Left: 50-74% stenosis noted in the mid portion of the anterior tibial artery. 30-49% stenosis noted in the mid portion of the posterior tibial artery.  See table(s) above for measurements and observations. Electronically signed by Jamelle Haring on 05/12/2022 at 8:24:07 PM.    Final    - pertinent xrays, CT, MRI studies were reviewed and independently interpreted  Positive ROS: All other systems have been reviewed and were otherwise negative with the exception of those mentioned in the HPI and as above.  Physical Exam: General: Alert, no acute distress Psychiatric: Patient is competent for consent with normal mood and affect Lymphatic: No axillary or cervical lymphadenopathy Cardiovascular: No pedal edema Respiratory: No cyanosis, no use of accessory musculature GI: No organomegaly, abdomen is soft and  non-tender    Images:  _0 @  Labs:  Lab Results  Component Value Date   HGBA1C 5.9 (H) 04/15/2022   HGBA1C 6.1 (H) 04/13/2022   HGBA1C 5.2 04/16/2021   ESRSEDRATE 56 (H) 05/10/2022   CRP 9.4 (H) 05/10/2022   REPTSTATUS PENDING 05/10/2022   CULT  05/10/2022    NO GROWTH 4 DAYS Performed at [facility], [place] [address], [place], [place] [address]     Lab Results  Component Value Date   ALBUMIN 2.4 (L) 05/14/2022   ALBUMIN 2.5 (L) 05/13/2022   ALBUMIN 3.1 (L) 05/10/2022  Latest Ref Rng & Units 05/14/2022    3:47 AM 05/13/2022    5:40 AM 05/11/2022    2:37 AM  CBC EXTENDED  WBC 4.0 - 10.5 K/uL 13.5  12.1  11.7   RBC 3.87 - 5.11 MIL/uL 2.97  2.67  3.01   Hemoglobin 12.0 - 15.0 g/dL 8.4  7.7  8.9   HCT 36.0 - 46.0 % 25.1  22.7  26.3   Platelets 150 - 400 K/uL 127  362  338   NEUT# 1.7 - 7.7 K/uL  7.1    Lymph# 0.7 - 4.0 K/uL  2.5      Neurologic: Patient does not have protective sensation bilateral lower extremities.   MUSCULOSKELETAL:   Skin: Examination patient has dry gangrenous changes involving the entire forefoot there is no purulence no cellulitis no signs of infection.  Patient's ankle-brachial indices showed monophasic flow.  Patient's white cell count has slowly increased to 13.5.  Hemoglobin 8.4.  Albumin 2.4 and a hemoglobin A1c of 5.9.  Assessment: Assessment: Severe peripheral vascular disease with dry gangrene of the right forefoot status post first ray amputation status post endovascular revascularization.  Plan: Plan: Patient will need at least a transtibial amputation.  I was unable to communicate with her secondary to her pain level.  I will discuss her case with Dr. [name] and will need to follow-up with the patient and family on Monday.  Thank you for the consult and the opportunity to see Ms. Lucile Shutters, [place] [phone] 9:17 AM

## 2022-05-14 NOTE — Progress Notes (Signed)
Patient ID: Carolyn Russo, female   DOB: 05/06/1951, 71 y.o.   MRN: [id number]   ORTHOPAEDIC CONSULTATION  REQUESTING PHYSICIAN: Samtani, Jai-Gurmukh, MD  Chief Complaint: Ischemic pain right foot.  HPI: Carolyn Russo is a 71 y.o. female who presents with dry gangrenous changes right forefoot.  Patient is status post limb salvage intervention with endovascular revascularization with Dr. Cain about 2 months ago and status post a first ray amputation about a month ago.  Patient has progressive dry gangrenous changes to the right forefoot and increasing ischemic pain.  Past Medical History:  Diagnosis Date   Acute GI bleeding    Allergy    Anemia    Anterior chest wall pain    Appendicitis 1965   Asthma    Body mass index 37.0-37.9, adult    Breast pain    Cataract    both eyes   CHF (congestive heart failure) (HCC)    Chronic kidney disease    stage 5 - on dialysis   Cognitive change 04/20/2021   r/t cva 03/2821   Complication of anesthesia    Memory loss after general   Dehydration 2014   Deviated septum 1971   Diabetes mellitus    Dysphagia due to old stroke    easy to get strangled when eating   Dyspnea 2014   Extrinsic asthma    WITH ASTHMA ATTACK   Fibroid 1980   GERD (gastroesophageal reflux disease)    Heart murmur    History of migraine    History of seizure    with stroke   Hx gestational diabetes    Hyperlipidemia    Hypertension 2014   Inguinal hernia 1959   Malaise and fatigue 2014   Non-IgE mediated allergic asthma 2014   Obesity    Pelvic pain    Pregnancy, high-risk 1985   Stroke (HCC) 04/20/2021   (CVA) of right basal ganglia   Tonsillitis 1968   Uterine fibroid 1980   Visual field defect    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   Past Surgical History:  Procedure Laterality Date   ABDOMINAL AORTOGRAM W/LOWER EXTREMITY N/A 02/21/2022   Procedure: ABDOMINAL AORTOGRAM W/LOWER EXTREMITY;  Surgeon: Cain, Brandon Christopher, MD;  Location: [facility];   Service: Cardiovascular;  Laterality: N/A;   AMPUTATION TOE Right 04/15/2022   Procedure: AMPUTATION  LST TOERIGHT FOOT;  Surgeon: McDonald, Adam R, DPM;  Location: [facility];  Service: Podiatry;  Laterality: Right;   APPENDECTOMY  1959   BASCILIC VEIN TRANSPOSITION Right 04/30/2021   Procedure: RIGHT FIRST STAGE BASCILIC VEIN TRANSPOSITION;  Surgeon: Dickson, Christopher S, MD;  Location: [facility];  Service: Vascular;  Laterality: Right;   CESAREAN SECTION  1985   COLONOSCOPY     ESOPHAGOGASTRODUODENOSCOPY (EGD) WITH PROPOFOL N/A 04/18/2021   Procedure: ESOPHAGOGASTRODUODENOSCOPY (EGD) WITH PROPOFOL;  Surgeon: Gessner, Carl E, MD;  Location: [facility];  Service: Endoscopy;  Laterality: N/A;   EYE SURGERY     bilateral cataract    FLEXIBLE SIGMOIDOSCOPY N/A 04/18/2021   Procedure: FLEXIBLE SIGMOIDOSCOPY;  Surgeon: Gessner, Carl E, MD;  Location: [facility];  Service: Endoscopy;  Laterality: N/A;   HERNIA REPAIR  1959   IR FLUORO GUIDE CV LINE RIGHT  03/18/2021   IR US GUIDE VASC ACCESS RIGHT  03/18/2021   LEFT HEART CATH AND CORONARY ANGIOGRAPHY N/A 04/04/2017   Procedure: Left Heart Cath and Coronary Angiography;  Surgeon: Smith, Henry W, MD;  Location: [facility];  Service: Cardiovascular;    Laterality: N/A;   [place], 2004, 2007   PERIPHERAL VASCULAR THROMBECTOMY  02/21/2022   Procedure: PERIPHERAL VASCULAR THROMBECTOMY;  Surgeon: Waynetta Sandy, MD;  Location: [name];  Service: Cardiovascular;;  Right Tibial   RHINOPLASTY  1971   ROTATOR CUFF REPAIR  2003   SURGICAL REPAIR OF HEMORRHAGE  2015   TONSILLECTOMY  1968   Social History   Socioeconomic History   Marital status: Married    Spouse name: Carloyn Russo   Number of children: 1   Years of education: Not on file   Highest education level: Professional school degree (e.g., MD, DDS, DVM, JD)  Occupational History   Occupation: retired  Tobacco Use   Smoking status: Never   Smokeless tobacco: Never   Vaping Use   Vaping Use: Never used  Substance and Sexual Activity   Alcohol use: No   Drug use: No   Sexual activity: Not Currently    Birth control/protection: Post-menopausal  Other Topics Concern   Not on file  Social History Narrative   06/21/21 lives with husband Dr Marylynn Pearson, caregiver- Zigmund Daniel   Patient reports she used to walk at [employer] but now due to Covid-19 she is staying inside.   Social Determinants of Health   Financial Resource Strain: Low Risk  (09/02/2021)   Overall Financial Resource Strain (CARDIA)    Difficulty of Paying Living Expenses: Not hard at all  Food Insecurity: No Food Insecurity (09/02/2021)   Hunger Vital Sign    Worried About Running Out of Food in the Last Year: Never true    Ran Out of Food in the Last Year: Never true  Transportation Needs: No Transportation Needs (09/02/2021)   PRAPARE - Hydrologist (Medical): No    Lack of Transportation (Non-Medical): No  Physical Activity: Inactive (09/02/2021)   Exercise Vital Sign    Days of Exercise per Week: 0 days    Minutes of Exercise per Session: 0 min  Stress: Stress Concern Present (09/02/2021)   [place]    Feeling of Stress : To some extent  Social Connections: Socially Integrated (01/09/2019)   Social Connection and Isolation Panel [NHANES]    Frequency of Communication with Friends and Family: More than three times a week    Frequency of Social Gatherings with Friends and Family: More than three times a week    Attends Religious Services: More than 4 times per year    Active Member of [employer] or Organizations: Yes    Attends Music therapist: More than 4 times per year    Marital Status: Married   Family History  Problem Relation Age of Onset   Cancer Mother        abdominal melamona   Psoriasis Mother    Alzheimer's disease Father    Cancer Cousin        colon    Diabetes  Cousin    Diabetes Maternal Aunt    Colon cancer Neg Hx    Colon polyps Neg Hx    Esophageal cancer Neg Hx    Rectal cancer Neg Hx    Stomach cancer Neg Hx    Breast cancer Neg Hx    - negative except otherwise stated in the family history section Allergies  Allergen Reactions   Food Anaphylaxis    Peanuts; Almonds   Wheat Bran     Upset stomach    Statins Itching  and Other (See Comments)    Generalized aches- tolerates crestor    Pork-Derived Products     Does not eat pork    Shellfish Allergy Other (See Comments)    Mouth gets raw   Sitagliptin Other (See Comments)    Januiva - unknown reaction    Tetracycline Other (See Comments)    Raw mouth   Contrast Media [Iodinated Contrast Media] Rash    Happened during CT scan over 30 years ago   Prior to Admission medications   Medication Sig Start Date End Date Taking? Authorizing Provider  acetaminophen (TYLENOL) 500 MG tablet Take 1,000 mg by mouth 2 (two) times daily as needed (leg and arm pain).   Yes [provider]  albuterol (VENTOLIN HFA) 108 (90 Base) MCG/ACT inhaler Inhale 2 puffs into the lungs every 4 (four) hours as needed for wheezing or shortness of breath. 04/13/22  Yes Minette Brine, FNP  Alcaftadine (LASTACAFT) 0.25 % SOLN Place 1 drop into both eyes daily as needed (itching).   Yes [provider]  ARTIFICIAL TEAR SOLUTION OP Place 1 drop into both eyes daily as needed (dry eyes).   Yes [provider]  carvedilol (COREG) 3.125 MG tablet Take 1 tablet (3.125 mg total) by mouth 2 (two) times daily with a meal. 11/01/21  Yes [name], MD  clopidogrel (PLAVIX) 75 MG tablet TAKE 1 TABLET(75 MG) BY MOUTH DAILY 02/11/22  Yes [name], MD  diclofenac Sodium (VOLTAREN) 1 % GEL Apply 2 g topically 4 (four) times daily. To left wrist Patient taking differently: Apply 2 g topically daily as needed (pain). To left wrist 05/26/21  Yes Love, Ivan Anchors, PA-C  diphenhydrAMINE (BENADRYL) 2 % cream  Apply 1 application. topically daily as needed for itching.   Yes [provider]  diphenhydrAMINE (BENADRYL) 25 MG tablet Take 25 mg by mouth at bedtime.   Yes [provider]  fluticasone (FLONASE) 50 MCG/ACT nasal spray Place 2 sprays into the nose daily as needed for allergies.    Yes [provider]  Insulin Aspart, w/Niacinamide, (FIASP) 100 UNIT/ML SOLN Inject 6-16 Units as directed 3 (three) times daily as needed (high blood sugar). Sliding scale 100-150=6 units 151-200=10 units 201-250=12 units 251-300=14 units 301-350=16 units Greater than 350 = 16 units   Yes [provider]  insulin glargine (LANTUS) 100 UNIT/ML Solostar Pen Inject 13 Units into the skin daily. Patient taking differently: Inject 12 Units into the skin at bedtime. 08/18/21  Yes Ghumman, Ramandeep, NP  levETIRAcetam (KEPPRA) 500 MG tablet TAKE 1 TABLET(500 MG) BY MOUTH AT BEDTIME 01/07/22  Yes Lovorn, Megan, MD  Lidocaine 4 % GEL Apply 1 Application topically daily as needed (pain).   Yes [provider]  losartan (COZAAR) 50 MG tablet Take 50 mg by mouth daily. 01/21/22  Yes [provider]  melatonin 5 MG TABS Take 5 mg by mouth at bedtime.   Yes [provider]  methylphenidate (RITALIN) 5 MG tablet Take 1 tablet (5 mg total) by mouth 2 (two) times daily with breakfast and lunch. Due to stroke attention Patient taking differently: Take 5 mg by mouth daily. Due to stroke attention 03/04/22  Yes Lovorn, Jinny Blossom, MD  mometasone-formoterol (DULERA) 200-5 MCG/ACT AERO Inhale 2 puffs into the lungs 2 (two) times daily. 04/15/22  Yes [name], MD  ondansetron (ZOFRAN) 8 MG tablet Take 8 mg by mouth 2 (two) times daily as needed for refractory nausea / vomiting.   Yes [provider]  ondansetron (ZOFRAN-ODT) 4 MG disintegrating tablet Take 1 tablet (4 mg total) by mouth every 8 (eight) hours as needed for nausea or vomiting. 03/07/22  Yes Curatolo, Adam,  DO  oxyCODONE (OXY IR/ROXICODONE) 5 MG immediate release tablet Take 1 tablet (5 mg total) by mouth every 4 (four) hours as needed for up to 7 days for severe pain. 05/09/22 05/16/22 Yes McDonald, Adam R, DPM  pantoprazole (PROTONIX) 40 MG tablet Take 1 tablet (40 mg total) by mouth daily. 02/07/22  Yes Sanders, Robyn, MD  rosuvastatin (CRESTOR) 20 MG tablet Take 1 tablet (20 mg total) by mouth daily. 08/18/21  Yes Ghumman, Ramandeep, NP  senna-docusate (SENOKOT-S) 8.6-50 MG tablet Take 2 tablets by mouth 2 (two) times daily. Patient taking differently: Take 1-2 tablets by mouth daily as needed for mild constipation or moderate constipation. 06/29/21  Yes Ghumman, Ramandeep, NP  sevelamer carbonate (RENVELA) 0.8 g PACK packet Take 0.8 g by mouth 3 (three) times daily with meals. 10/19/21  Yes [provider]  cefadroxil (DURICEF) 500 MG capsule Take 2 capsules (1,000 mg total) by mouth every Monday, Wednesday, and Friday with hemodialysis. 05/04/22   McDonald, Adam R, DPM  Continuous Blood Gluc Receiver (FREESTYLE LIBRE 2 READER) DEVI by Does not apply route.    [provider]  hydrALAZINE (APRESOLINE) 10 MG tablet Take 1 tablet (10 mg total) by mouth every 8 (eight) hours as needed (Sys BP >180, hold on HD days). Patient not taking: Reported on 05/10/2022 05/26/21   Love, Pamela S, PA-C  hydrocortisone (ANUSOL-HC) 2.5 % rectal cream Place 1 application rectally 2 (two) times daily as needed for hemorrhoids or anal itching. Patient not taking: Reported on 05/10/2022 04/20/21   Ghimire, Shanker M, MD  Insulin Pen Needle (BD PEN NEEDLE NANO 2ND GEN) 32G X 4 MM MISC See admin instructions. 07/15/21   [provider]  iron sucrose in sodium chloride 0.9 % 100 mL Inject 100 mg into the vein as needed (based on labs). 06/16/21   [provider]  Methoxy PEG-Epoetin Beta (MIRCERA IJ) Mircera 07/21/21 07/20/22  [provider]  mupirocin ointment (BACTROBAN) 2 % Apply 1  application. topically 2 (two) times daily. Patient not taking: Reported on 05/10/2022 02/01/22   McDonald, Adam R, DPM  Nutritional Supplements (,FEEDING SUPPLEMENT, PROSOURCE PLUS) liquid Take 30 mLs by mouth 2 (two) times daily between meals. Patient not taking: Reported on 04/14/2022 05/27/21   Love, Pamela S, PA-C  OneTouch Delica Lancets 33G MISC Use as directed to check blood sugars before breakfast and dinner dx: e11.65 08/18/21   Ghumman, Ramandeep, NP  traMADol (ULTRAM) 50 MG tablet TAKE 1 TABLET(50 MG) BY MOUTH EVERY 12 HOURS FOR 5 DAYS Patient not taking: Reported on 05/10/2022 03/28/22   McDonald, Adam R, DPM  iron polysaccharides (NIFEREX) 150 MG capsule Take 1 capsule (150 mg total) by mouth daily. Patient not taking: No sig reported 03/24/21 04/16/21  Samtani, Jai-Gurmukh, MD   VAS US LOWER EXTREMITY ARTERIAL DUPLEX  Result Date: 05/12/2022 LOWER EXTREMITY ARTERIAL DUPLEX STUDY Patient Name:  Sanye F Weninger  Date of Exam:   05/12/2022 Medical Rec #: [id number]       Accession #:    [id number] Date of Birth: 09/15/1951       Patient Gender: F Patient Age:   71 years Exam Location:  [facility] Procedure:      VAS US LOWER EXTREMITY ARTERIAL DUPLEX Referring Phys: CHRISTOPHER CLARK --------------------------------------------------------------------------------  Indications: RLE   great toe amputation on 04/15/22 with non healing surgical site              and gangrene. High Risk Factors: Hypertension, hyperlipidemia, Diabetes, no history of                    smoking, coronary artery disease, prior CVA. Other Factors: ESRD (HD), CHF.  Vascular Interventions: S/P right PTA atherectomy and stenting on 02/21/22,. Current ABI:            HX of non-compressible ABIs Limitations: Very difficult exam due to patient moving throughout entire exam. Comparison Study: Previous exam on 03/29/22 showed 30-49% focal stenosis in mid                   PTA Performing Technologist: Jody Hill RVT, RDMS  Examination  Guidelines: A complete evaluation includes B-mode imaging, spectral Doppler, color Doppler, and power Doppler as needed of all accessible portions of each vessel. Bilateral testing is considered an integral part of a complete examination. Limited examinations for reoccurring indications may be performed as noted.  +-----------+--------+-----+---------------+----------+--------------+ RIGHT      PSV cm/sRatioStenosis       Waveform  Comments       +-----------+--------+-----+---------------+----------+--------------+ CFA Mid    119                         triphasic                +-----------+--------+-----+---------------+----------+--------------+ DFA        86                          biphasic                 +-----------+--------+-----+---------------+----------+--------------+ SFA Prox   98                          triphasic                +-----------+--------+-----+---------------+----------+--------------+ SFA Mid    102                         triphasic                +-----------+--------+-----+---------------+----------+--------------+ SFA Distal 100                         triphasic                +-----------+--------+-----+---------------+----------+--------------+ POP Prox   90                          triphasic                +-----------+--------+-----+---------------+----------+--------------+ POP Distal 130                         biphasic                 +-----------+--------+-----+---------------+----------+--------------+ TP Trunk   125                         triphasic                +-----------+--------+-----+---------------+----------+--------------+ ATA Prox   79  biphasic                 +-----------+--------+-----+---------------+----------+--------------+ ATA Mid    207     2.62 50-74% stenosisbiphasic                 +-----------+--------+-----+---------------+----------+--------------+  ATA Distal 86                          monophasic               +-----------+--------+-----+---------------+----------+--------------+ PTA Prox   112                         triphasic                +-----------+--------+-----+---------------+----------+--------------+ PTA Mid    185     1.65 30-49% stenosisbiphasic                 +-----------+--------+-----+---------------+----------+--------------+ PTA Distal 77                          monophasic               +-----------+--------+-----+---------------+----------+--------------+ PERO Prox  77                          biphasic                 +-----------+--------+-----+---------------+----------+--------------+ PERO Mid   115                         biphasic                 +-----------+--------+-----+---------------+----------+--------------+ PERO Distal                                      not visualized +-----------+--------+-----+---------------+----------+--------------+ Unable to visualized and assess stent due to vessel calcification and patient movement.  Summary: Left: 50-74% stenosis noted in the mid portion of the anterior tibial artery. 30-49% stenosis noted in the mid portion of the posterior tibial artery.  See table(s) above for measurements and observations. Electronically signed by Jamelle Haring on 05/12/2022 at 8:24:07 PM.    Final    - pertinent xrays, CT, MRI studies were reviewed and independently interpreted  Positive ROS: All other systems have been reviewed and were otherwise negative with the exception of those mentioned in the HPI and as above.  Physical Exam: General: Alert, no acute distress Psychiatric: Patient is competent for consent with normal mood and affect Lymphatic: No axillary or cervical lymphadenopathy Cardiovascular: No pedal edema Respiratory: No cyanosis, no use of accessory musculature GI: No organomegaly, abdomen is soft and  non-tender    Images:  _0 @  Labs:  Lab Results  Component Value Date   HGBA1C 5.9 (H) 04/15/2022   HGBA1C 6.1 (H) 04/13/2022   HGBA1C 5.2 04/16/2021   ESRSEDRATE 56 (H) 05/10/2022   CRP 9.4 (H) 05/10/2022   REPTSTATUS PENDING 05/10/2022   CULT  05/10/2022    NO GROWTH 4 DAYS Performed at [facility], [place] [address], [place], [place] [address]     Lab Results  Component Value Date   ALBUMIN 2.4 (L) 05/14/2022   ALBUMIN 2.5 (L) 05/13/2022   ALBUMIN 3.1 (L) 05/10/2022  Latest Ref Rng & Units 05/14/2022    3:47 AM 05/13/2022    5:40 AM 05/11/2022    2:37 AM  CBC EXTENDED  WBC 4.0 - 10.5 K/uL 13.5  12.1  11.7   RBC 3.87 - 5.11 MIL/uL 2.97  2.67  3.01   Hemoglobin 12.0 - 15.0 g/dL 8.4  7.7  8.9   HCT 36.0 - 46.0 % 25.1  22.7  26.3   Platelets 150 - 400 K/uL 127  362  338   NEUT# 1.7 - 7.7 K/uL  7.1    Lymph# 0.7 - 4.0 K/uL  2.5      Neurologic: Patient does not have protective sensation bilateral lower extremities.   MUSCULOSKELETAL:   Skin: Examination patient has dry gangrenous changes involving the entire forefoot there is no purulence no cellulitis no signs of infection.  Patient's ankle-brachial indices showed monophasic flow.  Patient's white cell count has slowly increased to 13.5.  Hemoglobin 8.4.  Albumin 2.4 and a hemoglobin A1c of 5.9.  Assessment: Assessment: Severe peripheral vascular disease with dry gangrene of the right forefoot status post first ray amputation status post endovascular revascularization.  Plan: Plan: Patient will need at least a transtibial amputation.  I was unable to communicate with her secondary to her pain level.  I will discuss her case with Dr. [name] and will need to follow-up with the patient and family on Monday.  Thank you for the consult and the opportunity to see Ms. Lucile Shutters, [place] [phone] 9:17 AM

## 2022-05-14 NOTE — Progress Notes (Signed)
[facility] Progress Note   Subjective: Seen by VVS 05/13/2022 who are recommending R BKA vs AKA. Seen in room. Not quite to baseline mentally but better today than yesterday. Communicating clearer today. C/O being cold. Got her blanket. She said, "Thank you, [name]".   Objective Vitals:   05/13/22 1944 05/14/22 0409 05/14/22 0834 05/14/22 0903  BP: 131/73 (!) 159/77  (!) 161/69  Pulse: 74 75  75  Resp: 20   20  Temp: 98.6 F (37 C) 98.4 F (36.9 C)  98.2 F (36.8 C)  TempSrc: Oral Oral  Oral  SpO2: 100% 100% 100% 100%  Weight:      Height:       Physical Exam General: Chronically ill appearing female in NAD. Neuro: Oriented to self only.  Heart: S1,S2 no M/R/G. SR on monitor HR 90s.  Lungs: CTAB anteriorly Abdomen: NABS, NT Extremities: R foot with staples intact from R Toe amp with demarcation and ulceration present. Trace LLE edema.  Dialysis Access: RIJ [facility] CDI    Additional Objective Labs: Basic Metabolic Panel: Recent Labs  Lab 05/12/22 0346 05/13/22 0540 05/14/22 0347  NA 135 136 134*  K 4.2 4.5 4.3  CL 98 101 96*  CO2 '26 25 26  '$ GLUCOSE 100* 66* 105*  BUN 22 28* 21  CREATININE 5.07* 7.08* 5.24*  CALCIUM 8.4* 9.2 8.5*  PHOS  --  6.0* 4.5   Liver Function Tests: Recent Labs  Lab 05/10/22 1442 05/13/22 0540 05/14/22 0347  AST 28  --   --   ALT 21  --   --   ALKPHOS 57  --   --   BILITOT 0.9  --   --   PROT 7.6  --   --   ALBUMIN 3.1* 2.5* 2.4*   No results for input(s): "LIPASE", "AMYLASE" in the last 168 hours. CBC: Recent Labs  Lab 05/10/22 1442 05/11/22 0237 05/13/22 0540 05/14/22 0347  WBC 11.7* 11.7* 12.1* 13.5*  NEUTROABS 6.8  --  7.1  --   HGB 9.6* 8.9* 7.7* 8.4*  HCT 29.0* 26.3* 22.7* 25.1*  MCV 87.1 87.4 85.0 84.5  PLT 354 338 362 127*   Blood Culture    Component Value Date/Time   SDES BLOOD RIGHT ANTECUBITAL 05/10/2022 1546   SPECREQUEST  05/10/2022 1546    BOTTLES DRAWN AEROBIC AND ANAEROBIC  Blood Culture adequate volume   CULT  05/10/2022 1546    NO GROWTH 4 DAYS Performed at [facility], [place] [address], [place], [place] [address]    REPTSTATUS PENDING 05/10/2022 1546    Cardiac Enzymes: No results for input(s): "CKTOTAL", "CKMB", "CKMBINDEX", "TROPONINI" in the last 168 hours. CBG: Recent Labs  Lab 05/13/22 0724 05/13/22 1445 05/13/22 1615 05/13/22 2106 05/14/22 0909  GLUCAP 65* 71 164* 149* 96   Iron Studies: No results for input(s): "IRON", "TIBC", "TRANSFERRIN", "FERRITIN" in the last 72 hours. '@lablastinr3'$ @ Studies/Results: VAS [place] LOWER EXTREMITY ARTERIAL DUPLEX  Result Date: 05/12/2022 LOWER EXTREMITY ARTERIAL DUPLEX STUDY Patient Name:  Carolyn Russo [facility]  Date of Exam:   05/12/2022 Medical Rec #: [id number]       Accession #:    [id number] Date of Birth: 09-Aug-1951       Patient Gender: F Patient Age:   71 years Exam Location:  [facility] Procedure:      VAS [place] LOWER EXTREMITY ARTERIAL DUPLEX Referring Phys: Monica Martinez --------------------------------------------------------------------------------  Indications: RLE great toe amputation on 04/15/22 with non  healing surgical site              and gangrene. High Risk Factors: Hypertension, hyperlipidemia, Diabetes, no history of                    smoking, coronary artery disease, prior CVA. Other Factors: ESRD (HD), CHF.  Vascular Interventions: S/P right PTA atherectomy and stenting on 02/21/22,. Current ABI:            HX of non-compressible ABIs Limitations: Very difficult exam due to patient moving throughout entire exam. Comparison Study: Previous exam on 03/29/22 showed 30-49% focal stenosis in mid                   PTA Performing Technologist: Jody Hill RVT, RDMS  Examination Guidelines: A complete evaluation includes B-mode imaging, spectral Doppler, color Doppler, and power Doppler as needed of all accessible portions of each vessel. Bilateral testing is considered an integral part of a complete  examination. Limited examinations for reoccurring indications may be performed as noted.  +-----------+--------+-----+---------------+----------+--------------+ RIGHT      PSV cm/sRatioStenosis       Waveform  Comments       +-----------+--------+-----+---------------+----------+--------------+ CFA Mid    119                         triphasic                +-----------+--------+-----+---------------+----------+--------------+ DFA        86                          biphasic                 +-----------+--------+-----+---------------+----------+--------------+ SFA Prox   98                          triphasic                +-----------+--------+-----+---------------+----------+--------------+ SFA Mid    102                         triphasic                +-----------+--------+-----+---------------+----------+--------------+ SFA Distal 100                         triphasic                +-----------+--------+-----+---------------+----------+--------------+ POP Prox   90                          triphasic                +-----------+--------+-----+---------------+----------+--------------+ POP Distal 130                         biphasic                 +-----------+--------+-----+---------------+----------+--------------+ TP Trunk   125                         triphasic                +-----------+--------+-----+---------------+----------+--------------+ ATA Prox   79  biphasic                 +-----------+--------+-----+---------------+----------+--------------+ ATA Mid    207     2.62 50-74% stenosisbiphasic                 +-----------+--------+-----+---------------+----------+--------------+ ATA Distal 86                          monophasic               +-----------+--------+-----+---------------+----------+--------------+ PTA Prox   112                         triphasic                 +-----------+--------+-----+---------------+----------+--------------+ PTA Mid    185     1.65 30-49% stenosisbiphasic                 +-----------+--------+-----+---------------+----------+--------------+ PTA Distal 77                          monophasic               +-----------+--------+-----+---------------+----------+--------------+ PERO Prox  77                          biphasic                 +-----------+--------+-----+---------------+----------+--------------+ PERO Mid   115                         biphasic                 +-----------+--------+-----+---------------+----------+--------------+ PERO Distal                                      not visualized +-----------+--------+-----+---------------+----------+--------------+ Unable to visualized and assess stent due to vessel calcification and patient movement.  Summary: Left: 50-74% stenosis noted in the mid portion of the anterior tibial artery. 30-49% stenosis noted in the mid portion of the posterior tibial artery.  See table(s) above for measurements and observations. Electronically signed by Jamelle Haring on 05/12/2022 at 8:24:07 PM.    Final    Medications:  ceFEPime (MAXIPIME) IV 1 g (05/13/22 2035)   vancomycin Stopped (05/11/22 1947)    (feeding supplement) PROSource Plus  30 mL Oral BID BM   aspirin  81 mg Oral Daily   carvedilol  3.125 mg Oral BID WC   Chlorhexidine Gluconate Cloth  6 each Topical Q0600   darbepoetin (ARANESP) injection - DIALYSIS  200 mcg Intravenous Q Fri-HD   diphenhydrAMINE  25 mg Oral QHS   doxercalciferol  2 mcg Intravenous Q M,W,F-HD   feeding supplement (NEPRO CARB STEADY)  237 mL Oral BID BM   heparin injection (subcutaneous)  5,000 Units Subcutaneous Q8H   insulin aspart  0-6 Units Subcutaneous TID WC   insulin glargine-yfgn  4 Units Subcutaneous QHS   levETIRAcetam  500 mg Oral QHS   losartan  50 mg Oral Daily   methylphenidate  5 mg Oral Daily    mometasone-formoterol  2 puff Inhalation BID   pantoprazole  40 mg Oral Daily   rosuvastatin  20 mg Oral Daily   sevelamer carbonate  0.8 g Oral TID WC  Dialysis Orders: MWF - [facility] 4 hrs 180NRE 350/700 88.5 kg 2.0 K/2.0 Ca UFP 4  -No heparin -Mircera 200 mcg  IV q 2wks - last 04/29/22 -Venofer '100mg'$  IV X 5 doses-new order, hasn't been given yet -Hectorol 77mg IV TIW   Assessment/Plan: 1. Gangrene R Foot. VVS consulted. Possible BKA vs AKA. Vanc & Cefepime per primary.  2. ESRD -MWF. Next HD 05/16/2022.  3. Anemia - HGB 7.7. Give max ESA with HD today. Holding Fe load in setting of infection.  4. Secondary hyperparathyroidism - Continue binders, Corrected Ca+ 10.4. Decrease VDRA dose.  5. HTN/volume - Hypertensive this AM. Continue home meds. Increased UFG to 3.5. Lower volume as tolerated.  6. Nutrition - Very low albumin. Add protein supplements.  7. DMT2-per rpimary 8. H/O CVA-ASA, plavix, Keppra    Shahida Schnackenberg H. Ebony Yorio NP-C 05/14/2022, 10:00 AM  [phone]

## 2022-05-15 DIAGNOSIS — L089 Local infection of the skin and subcutaneous tissue, unspecified: Secondary | ICD-10-CM | POA: Diagnosis not present

## 2022-05-15 LAB — CBC WITH DIFFERENTIAL/PLATELET
Abs Immature Granulocytes: 0.07 10*3/uL (ref 0.00–0.07)
Basophils Absolute: 0 10*3/uL (ref 0.0–0.1)
Basophils Relative: 0 %
Eosinophils Absolute: 0.7 10*3/uL — ABNORMAL HIGH (ref 0.0–0.5)
Eosinophils Relative: 5 %
HCT: 24.2 % — ABNORMAL LOW (ref 36.0–46.0)
Hemoglobin: 8.2 g/dL — ABNORMAL LOW (ref 12.0–15.0)
Immature Granulocytes: 1 %
Lymphocytes Relative: 21 %
Lymphs Abs: 2.7 10*3/uL (ref 0.7–4.0)
MCH: 28.6 pg (ref 26.0–34.0)
MCHC: 33.9 g/dL (ref 30.0–36.0)
MCV: 84.3 fL (ref 80.0–100.0)
Monocytes Absolute: 1.9 10*3/uL — ABNORMAL HIGH (ref 0.1–1.0)
Monocytes Relative: 14 %
Neutro Abs: 7.8 10*3/uL — ABNORMAL HIGH (ref 1.7–7.7)
Neutrophils Relative %: 59 %
Platelets: 463 10*3/uL — ABNORMAL HIGH (ref 150–400)
RBC: 2.87 MIL/uL — ABNORMAL LOW (ref 3.87–5.11)
RDW: 18.7 % — ABNORMAL HIGH (ref 11.5–15.5)
WBC: 13.1 10*3/uL — ABNORMAL HIGH (ref 4.0–10.5)
nRBC: 0 % (ref 0.0–0.2)

## 2022-05-15 LAB — CULTURE, BLOOD (ROUTINE X 2)
Culture: NO GROWTH
Culture: NO GROWTH
Special Requests: ADEQUATE

## 2022-05-15 LAB — COMPREHENSIVE METABOLIC PANEL
ALT: 22 U/L (ref 0–44)
AST: 20 U/L (ref 15–41)
Albumin: 2.4 g/dL — ABNORMAL LOW (ref 3.5–5.0)
Alkaline Phosphatase: 50 U/L (ref 38–126)
Anion gap: 13 (ref 5–15)
BUN: 32 mg/dL — ABNORMAL HIGH (ref 8–23)
CO2: 26 mmol/L (ref 22–32)
Calcium: 9.1 mg/dL (ref 8.9–10.3)
Chloride: 96 mmol/L — ABNORMAL LOW (ref 98–111)
Creatinine, Ser: 7.47 mg/dL — ABNORMAL HIGH (ref 0.44–1.00)
GFR, Estimated: 5 mL/min — ABNORMAL LOW (ref >60)
Glucose, Bld: 153 mg/dL — ABNORMAL HIGH (ref 70–99)
Potassium: 4 mmol/L (ref 3.5–5.1)
Sodium: 135 mmol/L (ref 135–145)
Total Bilirubin: 0.5 mg/dL (ref 0.3–1.2)
Total Protein: 7.2 g/dL (ref 6.5–8.1)

## 2022-05-15 LAB — GLUCOSE, CAPILLARY
Glucose-Capillary: 110 mg/dL — ABNORMAL HIGH (ref 70–99)
Glucose-Capillary: 149 mg/dL — ABNORMAL HIGH (ref 70–99)
Glucose-Capillary: 188 mg/dL — ABNORMAL HIGH (ref 70–99)
Glucose-Capillary: 172 mg/dL — ABNORMAL HIGH (ref 70–99)

## 2022-05-15 NOTE — Plan of Care (Signed)

## 2022-05-15 NOTE — Progress Notes (Signed)
I stopped by the patient's room this morning to see how she was doing.  Still having pain to the foot.  She was not in any distress.  Foot appears to be stable.  She is likely going to need at least a transtibial amputation and Dr. Sharol Given as well as vascular surgery are following the patient.  Will defer to them for proximal amputation. Podiatry will sign off but if we can be of further assistance, please let us know.   Celesta Gentile, DPM

## 2022-05-15 NOTE — Progress Notes (Signed)
PROGRESS NOTE   Carolyn Russo  YPP:[id number] DOB: 04-May-1951 DOA: 05/10/2022 PCP: [name], MD  Brief Narrative:  71 year old black female ESRD on HD MWF acute basal ganglia CVA left-sided weakness 4/58/0998 complicated by seizures?  05/07/2021 admission DM TY 2 HTN HLD HFrEF Peripheral arterial disease with associated ischemia Lower GI bleed normal EGD 04/19/2019 flex sig hemorrhoids Stool Recent admission 6/23 and 04/17/2022 dry gangrene of right great toe and underwent elective operation as per Dr. Guy Sandifer of podiatry--- return to ED with slightly worsening of the leg despite having completed 2 weeks of cefadroxil 1000 with HD Worsening gangrenous changes to right forefoot WBC 11 platelet 354 BUNs/creatinine 32/7.2 CRP 9.4 potassium 4.3 lactic acid 0.9 MRI obtained Patient started on vancomycin and cefepime  She has had some confusion  was related to probably medications but is now a little bit better with change to Dilaudid and discontinuation of melatonin  Hospital-Problem based course  Gangrene to right foot Prior PAD-recent elective right great toe amputation Vascular input appreciated-await further discussion between orthopedics and family Stop oxy--use dilaudid for pain.  Mentation is improved with change from oxy although inconsistent reponses at times Continue vancomycin and cefepime for now-monitor leukocytosis ESRD MWF Renal input appreciated-continue Renvela 0.8 x 3 times daily Continue losartan 50 Basal ganglia CVA 04/20/2022 with left-sided weakness Underlying seizures Continue aspirin 81-secondary preventive measures Crestor 20 Continue Keppra 500 at bedtime Hypoglycemia 7/21 in known diabetes Cut back Lantus to 4 units and use sliding scale coverage Sugars 338-250 Metabolic encephalopathy Seems to have some element of confusion--encephalopathy is better with changed from oxygen to Dilaudid and discontinuation of melatonin Prior lower GI bleed-monitor  trends Anemia of unknown etiology Patient has had slight drop in hemoglobin-transfusion threshold below 8  DVT prophylaxis: TED hose Code Status: Full Family Communication: Discussed with Dr. [name] on 7/21 Disposition:  Status is: Inpatient Remains inpatient appropriate because: May require amputation   Consultants:  Renal Vascular  Procedures: Multiple  Antimicrobials: As above   Subjective:  Overall seems to be about the same and no real distress looks reasonably comfortable eating breakfast Somewhat tangential at times    Objective: Vitals:   05/14/22 1954 05/14/22 2037 05/15/22 0414 05/15/22 0804  BP: (!) 153/137  (!) 178/60 (!) 176/68  Pulse: 77 77 71 71  Resp:  14  18  Temp: 98.6 F (37 C)  98.4 F (36.9 C) 98.6 F (37 C)  TempSrc: Oral  Oral Oral  SpO2: 100%  100% 97%  Weight:      Height:        Intake/Output Summary (Last 24 hours) at 05/15/2022 0859 Last data filed at 05/15/2022 0700 Gross per 24 hour  Intake --  Output 1150 ml  Net -1150 ml    Filed Weights   05/11/22 1947 05/13/22 0826 05/13/22 1328  Weight: 96.2 kg 89.5 kg 85 kg    Examination:  More coherent awake no distress S1-S2 no murmur Chest is clear no rales rhonchi Abdomen is soft no rebound Wound appears about the same on the right lower extremity with gangrenous changes  Data Reviewed: personally reviewed   CBC    Component Value Date/Time   WBC 13.5 (H) 05/14/2022 0347   RBC 2.97 (L) 05/14/2022 0347   HGB 8.4 (L) 05/14/2022 0347   HGB 9.3 (L) 04/13/2022 1512   HCT 25.1 (L) 05/14/2022 0347   HCT 27.6 (L) 04/13/2022 1512   PLT 127 (L) 05/14/2022 0347   PLT 341 04/13/2022 1512  MCV 84.5 05/14/2022 0347   MCV 89 04/13/2022 1512   MCH 28.3 05/14/2022 0347   MCHC 33.5 05/14/2022 0347   RDW 18.6 (H) 05/14/2022 0347   RDW 16.1 (H) 04/13/2022 1512   LYMPHSABS 2.5 05/13/2022 0540   LYMPHSABS 2.4 04/13/2022 1512   MONOABS 1.9 (H) 05/13/2022 0540   EOSABS 0.5  05/13/2022 0540   EOSABS 0.4 04/13/2022 1512   BASOSABS 0.0 05/13/2022 0540   BASOSABS 0.1 04/13/2022 1512      Latest Ref Rng & Units 05/14/2022    3:47 AM 05/13/2022    5:40 AM 05/12/2022    3:46 AM  CMP  Glucose 70 - 99 mg/dL 105  66  100   BUN 8 - 23 mg/dL '21  28  22   '$ Creatinine 0.44 - 1.00 mg/dL 5.24  7.08  5.07   Sodium 135 - 145 mmol/L 134  136  135   Potassium 3.5 - 5.1 mmol/L 4.3  4.5  4.2   Chloride 98 - 111 mmol/L 96  101  98   CO2 22 - 32 mmol/L '26  25  26   '$ Calcium 8.9 - 10.3 mg/dL 8.5  9.2  8.4      Radiology Studies: No results found.   Scheduled Meds:  (feeding supplement) PROSource Plus  30 mL Oral BID BM   aspirin  81 mg Oral Daily   carvedilol  3.125 mg Oral BID WC   Chlorhexidine Gluconate Cloth  6 each Topical Q0600   darbepoetin (ARANESP) injection - DIALYSIS  200 mcg Intravenous Q Fri-HD   diphenhydrAMINE  25 mg Oral QHS   doxercalciferol  2 mcg Intravenous Q M,W,F-HD   feeding supplement (NEPRO CARB STEADY)  237 mL Oral BID BM   heparin injection (subcutaneous)  5,000 Units Subcutaneous Q8H   insulin aspart  0-6 Units Subcutaneous TID WC   insulin glargine-yfgn  4 Units Subcutaneous QHS   levETIRAcetam  500 mg Oral QHS   losartan  50 mg Oral Daily   methylphenidate  5 mg Oral Daily   mometasone-formoterol  2 puff Inhalation BID   pantoprazole  40 mg Oral Daily   rosuvastatin  20 mg Oral Daily   sevelamer carbonate  0.8 g Oral TID WC   Continuous Infusions:  ceFEPime (MAXIPIME) IV 1 g (05/14/22 2052)   vancomycin Stopped (05/11/22 1947)     LOS: 5 days   Time spent: 33  Nita Sells, MD [facility] To contact the attending provider between 7A-7P or the covering provider during after hours 7P-7A, please log into the web site [email or web address] and access using universal [place] password for that web site. If you do not have the password, please call the hospital operator.  05/15/2022, 8:59 AM

## 2022-05-15 NOTE — Progress Notes (Addendum)
[facility] Progress Note   Subjective:Seen in room, no issues reported from overnight. Still not at baseline mentally but improving.   Objective Vitals:   05/14/22 1954 05/14/22 2037 05/15/22 0414 05/15/22 0804  BP: (!) 153/137  (!) 178/60 (!) 176/68  Pulse: 77 77 71 71  Resp:  14  18  Temp: 98.6 F (37 C)  98.4 F (36.9 C) 98.6 F (37 C)  TempSrc: Oral  Oral Oral  SpO2: 100%  100% 97%  Weight:      Height:       Physical Exam General: Chronically ill appearing female in NAD. Neuro: Oriented to self only.  Heart: S1,S2 no M/R/G. HR 70s Lungs: CTAB anteriorly Abdomen: NABS, NT Extremities: R foot with staples intact from R Toe amp with demarcation and ulceration present. Trace LLE edema.  Dialysis Access: RIJ [facility] dr[facility] CDI  Additional Objective Labs: Basic Metabolic Panel: Recent Labs  Lab 05/12/22 0346 05/13/22 0540 05/14/22 0347  NA 135 136 134*  K 4.2 4.5 4.3  CL 98 101 96*  CO2 '26 25 26  '$ GLUCOSE 100* 66* 105*  BUN 22 28* 21  CREATININE 5.07* 7.08* 5.24*  CALCIUM 8.4* 9.2 8.5*  PHOS  --  6.0* 4.5   Liver Function Tests: Recent Labs  Lab 05/10/22 1442 05/13/22 0540 05/14/22 0347  AST 28  --   --   ALT 21  --   --   ALKPHOS 57  --   --   BILITOT 0.9  --   --   PROT 7.6  --   --   ALBUMIN 3.1* 2.5* 2.4*   No results for input(s): "LIPASE", "AMYLASE" in the last 168 hours. CBC: Recent Labs  Lab 05/10/22 1442 05/11/22 0237 05/13/22 0540 05/14/22 0347  WBC 11.7* 11.7* 12.1* 13.5*  NEUTROABS 6.8  --  7.1  --   HGB 9.6* 8.9* 7.7* 8.4*  HCT 29.0* 26.3* 22.7* 25.1*  MCV 87.1 87.4 85.0 84.5  PLT 354 338 362 127*   Blood Culture    Component Value Date/Time   SDES BLOOD RIGHT ANTECUBITAL 05/10/2022 1546   SPECREQUEST  05/10/2022 1546    BOTTLES DRAWN AEROBIC AND ANAEROBIC Blood Culture adequate volume   CULT  05/10/2022 1546    NO GROWTH 5 DAYS Performed at [facility], [place] [address], [place], [place] [address]     REPTSTATUS 05/15/2022 FINAL 05/10/2022 1546    Cardiac Enzymes: No results for input(s): "CKTOTAL", "CKMB", "CKMBINDEX", "TROPONINI" in the last 168 hours. CBG: Recent Labs  Lab 05/14/22 0909 05/14/22 1210 05/14/22 1703 05/14/22 2147 05/15/22 0803  GLUCAP 96 132* 134* 173* 110*   Iron Studies: No results for input(s): "IRON", "TIBC", "TRANSFERRIN", "FERRITIN" in the last 72 hours. '@lablastinr3'$ @ Studies/Results: No results found. Medications:  ceFEPime (MAXIPIME) IV 1 g (05/14/22 2052)   vancomycin Stopped (05/11/22 1947)    (feeding supplement) PROSource Plus  30 mL Oral BID BM   aspirin  81 mg Oral Daily   carvedilol  3.125 mg Oral BID WC   Chlorhexidine Gluconate Cloth  6 each Topical Q0600   darbepoetin (ARANESP) injection - DIALYSIS  200 mcg Intravenous Q Fri-HD   diphenhydrAMINE  25 mg Oral QHS   doxercalciferol  2 mcg Intravenous Q M,W,F-HD   feeding supplement (NEPRO CARB STEADY)  237 mL Oral BID BM   heparin injection (subcutaneous)  5,000 Units Subcutaneous Q8H   insulin aspart  0-6 Units Subcutaneous TID WC   insulin glargine-yfgn  4 Units Subcutaneous QHS   levETIRAcetam  500 mg Oral QHS   losartan  50 mg Oral Daily   methylphenidate  5 mg Oral Daily   mometasone-formoterol  2 puff Inhalation BID   pantoprazole  40 mg Oral Daily   rosuvastatin  20 mg Oral Daily   sevelamer carbonate  0.8 g Oral TID WC     Dialysis Orders: MWF - [facility] 4 hrs 180NRE 350/700 88.5 kg 2.0 K/2.0 Ca UFP 4  -No heparin -Mircera 200 mcg  IV q 2wks - last 04/29/22 -Venofer '100mg'$  IV X 5 doses-new order, hasn't been given yet -Hectorol 41mg IV TIW   Assessment/Plan: 1. Gangrene R Foot. VVS consulted. Possible BKA vs AKA. Vanc & Cefepime per primary.  2. ESRD -MWF. Next HD 05/16/2022.  3. Anemia - HGB 8.4. Aranesp 200 mcg IV with HD 05/13/2022. Holding Fe load in setting of infection.  4. Secondary hyperparathyroidism - Continue binders, Corrected Ca+ 10.4.  Decrease VDRA dose.  5. HTN/volume - Hypertensive this AM. Continue home meds. Increased UFG to 3.5. Lower volume as tolerated. Under OP EDW if wts are accurate. Lower EDW on discharge.  6. Nutrition - Very low albumin. Add protein supplements.  7. DMT2-per rpimary 8. H/O CVA-ASA, plavix, Keppra  Arilynn Blakeney H. Antonae Zbikowski NP-C 05/15/2022, 10:14 AM  [phone]

## 2022-05-16 ENCOUNTER — Inpatient Hospital Stay (HOSPITAL_COMMUNITY): Payer: Medicare PPO

## 2022-05-16 DIAGNOSIS — L089 Local infection of the skin and subcutaneous tissue, unspecified: Secondary | ICD-10-CM | POA: Diagnosis not present

## 2022-05-16 LAB — GLUCOSE, CAPILLARY
Glucose-Capillary: 142 mg/dL — ABNORMAL HIGH (ref 70–99)
Glucose-Capillary: 145 mg/dL — ABNORMAL HIGH (ref 70–99)
Glucose-Capillary: 161 mg/dL — ABNORMAL HIGH (ref 70–99)
Glucose-Capillary: 121 mg/dL — ABNORMAL HIGH (ref 70–99)

## 2022-05-16 LAB — CBC WITH DIFFERENTIAL/PLATELET
Abs Immature Granulocytes: 0.07 10*3/uL (ref 0.00–0.07)
Basophils Absolute: 0.1 10*3/uL (ref 0.0–0.1)
Basophils Relative: 0 %
Eosinophils Absolute: 0.6 10*3/uL — ABNORMAL HIGH (ref 0.0–0.5)
Eosinophils Relative: 4 %
HCT: 23.4 % — ABNORMAL LOW (ref 36.0–46.0)
Hemoglobin: 7.8 g/dL — ABNORMAL LOW (ref 12.0–15.0)
Immature Granulocytes: 1 %
Lymphocytes Relative: 25 %
Lymphs Abs: 3.7 10*3/uL (ref 0.7–4.0)
MCH: 28.6 pg (ref 26.0–34.0)
MCHC: 33.3 g/dL (ref 30.0–36.0)
MCV: 85.7 fL (ref 80.0–100.0)
Monocytes Absolute: 2 10*3/uL — ABNORMAL HIGH (ref 0.1–1.0)
Monocytes Relative: 13 %
Neutro Abs: 8.3 10*3/uL — ABNORMAL HIGH (ref 1.7–7.7)
Neutrophils Relative %: 57 %
Platelets: 477 10*3/uL — ABNORMAL HIGH (ref 150–400)
RBC: 2.73 MIL/uL — ABNORMAL LOW (ref 3.87–5.11)
RDW: 19 % — ABNORMAL HIGH (ref 11.5–15.5)
WBC: 14.7 10*3/uL — ABNORMAL HIGH (ref 4.0–10.5)
nRBC: 0 % (ref 0.0–0.2)

## 2022-05-16 LAB — AMMONIA: Ammonia: 11 umol/L (ref 9–35)

## 2022-05-16 LAB — RENAL FUNCTION PANEL
Albumin: 2.3 g/dL — ABNORMAL LOW (ref 3.5–5.0)
Anion gap: 16 — ABNORMAL HIGH (ref 5–15)
BUN: 41 mg/dL — ABNORMAL HIGH (ref 8–23)
CO2: 24 mmol/L (ref 22–32)
Calcium: 9 mg/dL (ref 8.9–10.3)
Chloride: 95 mmol/L — ABNORMAL LOW (ref 98–111)
Creatinine, Ser: 8.51 mg/dL — ABNORMAL HIGH (ref 0.44–1.00)
GFR, Estimated: 5 mL/min — ABNORMAL LOW (ref >60)
Glucose, Bld: 166 mg/dL — ABNORMAL HIGH (ref 70–99)
Phosphorus: 5.4 mg/dL — ABNORMAL HIGH (ref 2.5–4.6)
Potassium: 4.4 mmol/L (ref 3.5–5.1)
Sodium: 135 mmol/L (ref 135–145)

## 2022-05-16 MED ORDER — HEPARIN SODIUM (PORCINE) 1000 UNIT/ML IJ SOLN
INTRAMUSCULAR | Status: AC
  Administered 2022-05-16: 4300 [IU] via INTRAVENOUS
  Filled 2022-05-16: qty 1

## 2022-05-16 MED ORDER — HYDRALAZINE HCL 20 MG/ML IJ SOLN
10.0000 mg | Freq: Once | INTRAMUSCULAR | Status: AC | PRN
Start: 2022-05-16 — End: 2022-05-16
  Administered 2022-05-16: 10 mg via INTRAVENOUS
  Filled 2022-05-16: qty 1

## 2022-05-16 MED ORDER — MILK AND MOLASSES ENEMA
1.0000 | Freq: Once | RECTAL | Status: AC
  Administered 2022-05-16: 240 mL via RECTAL
  Filled 2022-05-16: qty 240

## 2022-05-16 MED ORDER — ALTEPLASE 2 MG IJ SOLR: 2.0000 mg | Freq: Once | INTRAMUSCULAR | Status: DC | PRN

## 2022-05-16 MED ORDER — ACETAMINOPHEN 500 MG PO TABS
1000.0000 mg | ORAL_TABLET | Freq: Three times a day (TID) | ORAL | Status: DC
  Administered 2022-05-16 – 2022-05-22 (×10): 1000 mg via ORAL
  Administered 2022-05-23: 500 mg via ORAL
  Administered 2022-05-24 – 2022-06-10 (×44): 1000 mg via ORAL
  Filled 2022-05-16 (×67): qty 2

## 2022-05-16 MED ORDER — ANTICOAGULANT SODIUM CITRATE 4% (200MG/5ML) IV SOLN
5.0000 mL | Status: DC | PRN
  Filled 2022-05-16: qty 5

## 2022-05-16 MED ORDER — HEPARIN SODIUM (PORCINE) 1000 UNIT/ML IJ SOLN
4300.0000 [IU] | INTRAMUSCULAR | Status: DC | PRN
  Filled 2022-05-16: qty 4.3

## 2022-05-16 MED ORDER — HEPARIN SODIUM (PORCINE) 1000 UNIT/ML IJ SOLN
INTRAMUSCULAR | Status: AC
  Administered 2022-05-16: 3000 [IU]
  Filled 2022-05-16: qty 3

## 2022-05-16 NOTE — Progress Notes (Signed)
PROGRESS NOTE   Carolyn Russo  DGL:[id number] DOB: [birth year] DOA: 05/10/2022 PCP: [name], MD  Brief Narrative:   71 year old black female ESRD on HD MWF acute basal ganglia CVA left-sided weakness 03/11/8415 complicated by seizures?  05/07/2021 admission DM TY 2 HTN HLD HFrEF Peripheral arterial disease with associated ischemia Lower GI bleed normal EGD 04/19/2019 flex sig hemorrhoids Stool Recent admission 6/23 and 04/17/2022 dry gangrene of right great toe and underwent elective operation as per Dr. Guy Sandifer of podiatry--- return to ED with slightly worsening of the leg despite having completed 2 weeks of cefadroxil 1000 with HD Worsening gangrenous changes to right forefoot WBC 11 platelet 354 BUNs/creatinine 32/7.2 CRP 9.4 potassium 4.3 lactic acid 0.9 MRI obtained Patient started on vancomycin and cefepime  She has had some confusion and we are working that up for infectious sources  Hospital-Problem based course  Metabolic encephalopathy She has intermittent confusion--seems worse today Get CXR and ammonia level She has  a persistent leukocytosis, despite Abx.  Would re culture her --as has temp access in R chest and a wound on legs Gangrene to right foot Prior PAD-recent elective right great toe amputation Vascular input appreciated-scheduled for BKA 7/26 Dr. Sharol Given after discussions with ortho/family Stop oxy--use dilaudid for pain given chance of build up of toxic metabolites  Continue vancomycin and cefepime for now-monitor leukocytosis ESRD MWF Renal input appreciated-continue Renvela 0.8 x 3 times daily Continue losartan 50 Basal ganglia CVA 04/20/2022 with left-sided weakness Underlying seizures Continue aspirin 81-secondary preventive measures Crestor 20 Continue Keppra 500 at bedtime Hypoglycemia 7/21 in known diabetes Cut back Lantus to 4 units and use sliding scale coverage Sugars 140-170 Prior lower GI bleed-monitor trends Anemia of unknown  etiology Patient has had slight drop in hemoglobin-transfusion threshold below 8--monitor 1 more day Get iron studies  DVT prophylaxis: TED hose Code Status: Full Family Communication: Discussed with Dr. [name] on 7/24 [phone] and clarified planning Disposition:  Status is: Inpatient Remains inpatient appropriate because: May require amputation   Consultants:  Renal Vascular  Procedures: Multiple  Antimicrobials: As above   Subjective:  Quite confused this am--keeps saying " less time" She was seen on HD unit She seems to have been this way most of the am   Objective: Vitals:   05/16/22 0830 05/16/22 0900 05/16/22 0930 05/16/22 1000  BP: (!) 209/168 (!) 145/82 (!) 155/81 (!) 144/70  Pulse: 78   76  Resp: (!) 22   20  Temp:      TempSrc:      SpO2: 100%   99%  Weight:      Height:        Intake/Output Summary (Last 24 hours) at 05/16/2022 1017 Last data filed at 05/16/2022 0644 Gross per 24 hour  Intake --  Output 1050 ml  Net -1050 ml    Filed Weights   05/13/22 0826 05/13/22 1328 05/16/22 0708  Weight: 89.5 kg 85 kg 87 kg    Examination:  Eomi ncat no focal deficit Access in R upper chest Abd soft nt nd  Wound looks about the same Neuro intact to movement but some writhing movements  Data Reviewed: personally reviewed   CBC    Component Value Date/Time   WBC 14.7 (H) 05/16/2022 0213   RBC 2.73 (L) 05/16/2022 0213   HGB 7.8 (L) 05/16/2022 0213   HGB 9.3 (L) 04/13/2022 1512   HCT 23.4 (L) 05/16/2022 0213   HCT 27.6 (L) 04/13/2022 1512   PLT 477 (H)  05/16/2022 0213   PLT 341 04/13/2022 1512   MCV 85.7 05/16/2022 0213   MCV 89 04/13/2022 1512   MCH 28.6 05/16/2022 0213   MCHC 33.3 05/16/2022 0213   RDW 19.0 (H) 05/16/2022 0213   RDW 16.1 (H) 04/13/2022 1512   LYMPHSABS 3.7 05/16/2022 0213   LYMPHSABS 2.4 04/13/2022 1512   MONOABS 2.0 (H) 05/16/2022 0213   EOSABS 0.6 (H) 05/16/2022 0213   EOSABS 0.4 04/13/2022 1512   BASOSABS 0.1  05/16/2022 0213   BASOSABS 0.1 04/13/2022 1512      Latest Ref Rng & Units 05/16/2022    2:13 AM 05/15/2022    9:28 AM 05/14/2022    3:47 AM  CMP  Glucose 70 - 99 mg/dL 166  153  105   BUN 8 - 23 mg/dL 41  32  21   Creatinine 0.44 - 1.00 mg/dL 8.51  7.47  5.24   Sodium 135 - 145 mmol/L 135  135  134   Potassium 3.5 - 5.1 mmol/L 4.4  4.0  4.3   Chloride 98 - 111 mmol/L 95  96  96   CO2 22 - 32 mmol/L '24  26  26   '$ Calcium 8.9 - 10.3 mg/dL 9.0  9.1  8.5   Total Protein 6.5 - 8.1 g/dL  7.2    Total Bilirubin 0.3 - 1.2 mg/dL  0.5    Alkaline Phos 38 - 126 U/L  50    AST 15 - 41 U/L  20    ALT 0 - 44 U/L  22       Radiology Studies: No results found.   Scheduled Meds:  (feeding supplement) PROSource Plus  30 mL Oral BID BM   aspirin  81 mg Oral Daily   carvedilol  3.125 mg Oral BID WC   Chlorhexidine Gluconate Cloth  6 each Topical Q0600   darbepoetin (ARANESP) injection - DIALYSIS  200 mcg Intravenous Q Fri-HD   doxercalciferol  2 mcg Intravenous Q M,W,F-HD   feeding supplement (NEPRO CARB STEADY)  237 mL Oral BID BM   heparin injection (subcutaneous)  5,000 Units Subcutaneous Q8H   insulin aspart  0-6 Units Subcutaneous TID WC   insulin glargine-yfgn  4 Units Subcutaneous QHS   levETIRAcetam  500 mg Oral QHS   losartan  50 mg Oral Daily   methylphenidate  5 mg Oral Daily   mometasone-formoterol  2 puff Inhalation BID   pantoprazole  40 mg Oral Daily   rosuvastatin  20 mg Oral Daily   sevelamer carbonate  0.8 g Oral TID WC   Continuous Infusions:  anticoagulant sodium citrate     ceFEPime (MAXIPIME) IV 1 g (05/15/22 2156)   vancomycin Stopped (05/11/22 1947)     LOS: 6 days   Time spent: 50  Nita Sells, MD [facility] To contact the attending provider between 7A-7P or the covering provider during after hours 7P-7A, please log into the web site [email or web address] and access using universal [place] password for that web site. If you do not have the  password, please call the hospital operator.  05/16/2022, 10:17 AM

## 2022-05-16 NOTE — Progress Notes (Signed)
Pharmacy Antibiotic Note  Carolyn Russo is a 71 y.o. female admitted on 05/10/2022 presenting with R-foot infection.  Pharmacy has been consulted for vancomycin and cefepime dosing.  ESRD-HD usually MWF  Plan: Continue Vancomycin 1000 mg IV qHD, dose due today after HD Continue Cefepime 1g IV q 24h Monitor HD schedule, Cx/intervention recs to narrow Vancomycin random level as needed  Height: 5' 2.5" (158.8 cm) Weight: 87 kg (191 lb 12.8 oz) IBW/kg (Calculated) : 51.25  Temp (24hrs), Avg:99 F (37.2 C), Min:98.6 F (37 C), Max:99.6 F (37.6 C)  Recent Labs  Lab 05/10/22 1442 05/10/22 1546 05/11/22 0237 05/12/22 0346 05/13/22 0540 05/14/22 0347 05/15/22 0928 05/16/22 0213  WBC 11.7*  --  11.7*  --  12.1* 13.5* 13.1* 14.7*  CREATININE 7.26*  --  7.60* 5.07* 7.08* 5.24* 7.47* 8.51*  LATICACIDVEN 0.9 1.0  --   --   --   --   --   --      Estimated Creatinine Clearance: 6.1 mL/min (A) (by C-G formula based on SCr of 8.51 mg/dL (H)).    Allergies  Allergen Reactions   Food Anaphylaxis    Peanuts; Almonds   Wheat Bran     Upset stomach    Statins Itching and Other (See Comments)    Generalized aches- tolerates crestor    Pork-Derived Products     Does not eat pork    Shellfish Allergy Other (See Comments)    Mouth gets raw   Sitagliptin Other (See Comments)    Januiva - unknown reaction    Tetracycline Other (See Comments)    Raw mouth   Contrast Media [Iodinated Contrast Media] Rash    Happened during CT scan over 30 years ago    Marcella Dunnaway A. Levada Dy, PharmD, BCPS, FNKF Clinical Pharmacist [facility] Please utilize Amion for appropriate phone number to reach the unit pharmacist ([place])  05/16/2022 8:03 AM

## 2022-05-16 NOTE — Progress Notes (Signed)
  Progress Note    05/16/2022 1:42 PM * No surgery date entered *  Subjective:  No complaints    Vitals:   05/16/22 1216 05/16/22 1312  BP:  (!) 149/97  Pulse: 88 84  Resp: 20 19  Temp:  98.8 F (37.1 C)  SpO2: 100% 100%    Physical Exam: Lungs:  nonlabored Extremities:  right foot gangrene progressing  CBC    Component Value Date/Time   WBC 14.7 (H) 05/16/2022 0213   RBC 2.73 (L) 05/16/2022 0213   HGB 7.8 (L) 05/16/2022 0213   HGB 9.3 (L) 04/13/2022 1512   HCT 23.4 (L) 05/16/2022 0213   HCT 27.6 (L) 04/13/2022 1512   PLT 477 (H) 05/16/2022 0213   PLT 341 04/13/2022 1512   MCV 85.7 05/16/2022 0213   MCV 89 04/13/2022 1512   MCH 28.6 05/16/2022 0213   MCHC 33.3 05/16/2022 0213   RDW 19.0 (H) 05/16/2022 0213   RDW 16.1 (H) 04/13/2022 1512   LYMPHSABS 3.7 05/16/2022 0213   LYMPHSABS 2.4 04/13/2022 1512   MONOABS 2.0 (H) 05/16/2022 0213   EOSABS 0.6 (H) 05/16/2022 0213   EOSABS 0.4 04/13/2022 1512   BASOSABS 0.1 05/16/2022 0213   BASOSABS 0.1 04/13/2022 1512    BMET    Component Value Date/Time   NA 135 05/16/2022 0213   NA 142 04/13/2022 1512   K 4.4 05/16/2022 0213   CL 95 (L) 05/16/2022 0213   CO2 24 05/16/2022 0213   GLUCOSE 166 (H) 05/16/2022 0213   BUN 41 (H) 05/16/2022 0213   BUN 12 04/13/2022 1512   CREATININE 8.51 (H) 05/16/2022 0213   CREATININE 0.84 10/13/2011 0903   CALCIUM 9.0 05/16/2022 0213   GFRNONAA 5 (L) 05/16/2022 0213   GFRNONAA 75 10/13/2011 0903   GFRAA 17 06/19/2020 0000   GFRAA 86 10/13/2011 0903    INR    Component Value Date/Time   INR 1.2 05/10/2022 1442     Intake/Output Summary (Last 24 hours) at 05/16/2022 1342 Last data filed at 05/16/2022 1312 Gross per 24 hour  Intake --  Output 3550 ml  Net -3550 ml     Assessment/Plan:  71 y.o. female is here being followed for dry gangrene of R foot    -Worsening gangrene of R foot -Patient set for BKA with Dr.Duda on 7/26 -Patient still on vancomycin and  cefepime    Vicente Serene, PA-C [facility] [phone] 05/16/2022 1:42 PM

## 2022-05-16 NOTE — Progress Notes (Signed)
[facility] Progress Note   Subjective:   Patient seen and examined at bedside during dialysis.  Remains confused, repeating "I dont care".  Not answering questions.   Objective Vitals:   05/16/22 0708 05/16/22 0726 05/16/22 0800 05/16/22 0821  BP: (!) 147/81 (!) 196/106 (!) 190/164 (!) 162/82  Pulse: 74 73  76  Resp: 12 (!) 22  (!) 22  Temp: 98.9 F (37.2 C)     TempSrc: Oral     SpO2: 98% 99%  99%  Weight: 87 kg     Height:       Physical Exam General:chronically ill appearing female, confused in NAD Heart:RRR, no mrg Lungs:CTAB, nml WOB Abdomen:soft, NTND Extremities:R foot w/staples intact from R toe amp w/demarcation and ulceration. Trace LLE edema Dialysis Access: R IJ TDC in use   Filed Weights   05/13/22 0826 05/13/22 1328 05/16/22 0708  Weight: 89.5 kg 85 kg 87 kg    Intake/Output Summary (Last 24 hours) at 05/16/2022 0838 Last data filed at 05/16/2022 0644 Gross per 24 hour  Intake --  Output 1050 ml  Net -1050 ml    Additional Objective Labs: Basic Metabolic Panel: Recent Labs  Lab 05/13/22 0540 05/14/22 0347 05/15/22 0928 05/16/22 0213  NA 136 134* 135 135  K 4.5 4.3 4.0 4.4  CL 101 96* 96* 95*  CO2 '25 26 26 24  '$ GLUCOSE 66* 105* 153* 166*  BUN 28* 21 32* 41*  CREATININE 7.08* 5.24* 7.47* 8.51*  CALCIUM 9.2 8.5* 9.1 9.0  PHOS 6.0* 4.5  --  5.4*   Liver Function Tests: Recent Labs  Lab 05/10/22 1442 05/13/22 0540 05/14/22 0347 05/15/22 0928 05/16/22 0213  AST 28  --   --  20  --   ALT 21  --   --  22  --   ALKPHOS 57  --   --  50  --   BILITOT 0.9  --   --  0.5  --   PROT 7.6  --   --  7.2  --   ALBUMIN 3.1*   < > 2.4* 2.4* 2.3*   < > = values in this interval not displayed.   No results for input(s): "LIPASE", "AMYLASE" in the last 168 hours. CBC: Recent Labs  Lab 05/11/22 0237 05/13/22 0540 05/14/22 0347 05/15/22 0928 05/16/22 0213  WBC 11.7* 12.1* 13.5* 13.1* 14.7*  NEUTROABS  --  7.1  --  7.8* 8.3*  HGB  8.9* 7.7* 8.4* 8.2* 7.8*  HCT 26.3* 22.7* 25.1* 24.2* 23.4*  MCV 87.4 85.0 84.5 84.3 85.7  PLT 338 362 127* 463* 477*   Blood Culture    Component Value Date/Time   SDES BLOOD RIGHT ANTECUBITAL 05/10/2022 1546   SPECREQUEST  05/10/2022 1546    BOTTLES DRAWN AEROBIC AND ANAEROBIC Blood Culture adequate volume   CULT  05/10/2022 1546    NO GROWTH 5 DAYS Performed at [facility], [place] [address] [place], [place] [address]    REPTSTATUS 05/15/2022 FINAL 05/10/2022 1546    CBG: Recent Labs  Lab 05/15/22 0803 05/15/22 1211 05/15/22 1632 05/15/22 1939 05/16/22 0600  GLUCAP 110* 149* 188* 172* 142*    Medications:  anticoagulant sodium citrate     ceFEPime (MAXIPIME) IV 1 g (05/15/22 2156)   vancomycin Stopped (05/11/22 1947)    (feeding supplement) PROSource Plus  30 mL Oral BID BM   aspirin  81 mg Oral Daily   carvedilol  3.125 mg Oral BID WC  Chlorhexidine Gluconate Cloth  6 each Topical Q0600   darbepoetin (ARANESP) injection - DIALYSIS  200 mcg Intravenous Q Fri-HD   diphenhydrAMINE  25 mg Oral QHS   doxercalciferol  2 mcg Intravenous Q M,W,F-HD   feeding supplement (NEPRO CARB STEADY)  237 mL Oral BID BM   heparin injection (subcutaneous)  5,000 Units Subcutaneous Q8H   insulin aspart  0-6 Units Subcutaneous TID WC   insulin glargine-yfgn  4 Units Subcutaneous QHS   levETIRAcetam  500 mg Oral QHS   losartan  50 mg Oral Daily   methylphenidate  5 mg Oral Daily   mometasone-formoterol  2 puff Inhalation BID   pantoprazole  40 mg Oral Daily   rosuvastatin  20 mg Oral Daily   sevelamer carbonate  0.8 g Oral TID WC    Dialysis Orders: MWF - [facility] 4 hrs 180NRE 350/700 88.5 kg 2.0 K/2.0 Ca UFP 4  -No heparin -Mircera 200 mcg  IV q 2wks - last 04/29/22 -Venofer '100mg'$  IV X 5 doses-new order, hasn't been given yet -Hectorol 76mg IV TIW   Assessment/Plan: 1. Gangrene R Foot. VVS consulted. Possible BKA vs AKA. Vanc & Cefepime per primary.  2.  ESRD -MWF. HD today per regular schedule. 3. Anemia - HGB 7.8. Aranesp 200 mcg IV with HD 05/13/2022. Holding Fe load in setting of infection.  4. Secondary hyperparathyroidism - phos 5.4. Continue binders, Corrected Ca+ 10.4. Decreased VDRA dose.  5. HTN/volume - Hypertensive this AM. Continue home meds. Increased UFG to 3.5. Lower volume as tolerated. Under OP EDW if wts are accurate. Lower EDW on discharge.  6. Nutrition - Very low albumin. Add protein supplements.  7. DMT2-per rpimary 8. H/O CVA-ASA, plavix, Keppra  LJen Mow PA-C [facility]7/24/2023,8:38 AM  LOS: 6 days

## 2022-05-16 NOTE — Plan of Care (Signed)

## 2022-05-17 ENCOUNTER — Encounter (HOSPITAL_BASED_OUTPATIENT_CLINIC_OR_DEPARTMENT_OTHER): Payer: Medicare PPO | Admitting: Internal Medicine

## 2022-05-17 DIAGNOSIS — L089 Local infection of the skin and subcutaneous tissue, unspecified: Secondary | ICD-10-CM | POA: Diagnosis not present

## 2022-05-17 LAB — CBC WITH DIFFERENTIAL/PLATELET
Abs Immature Granulocytes: 0.08 10*3/uL — ABNORMAL HIGH (ref 0.00–0.07)
Basophils Absolute: 0.1 10*3/uL (ref 0.0–0.1)
Basophils Relative: 1 %
Eosinophils Absolute: 0.4 10*3/uL (ref 0.0–0.5)
Eosinophils Relative: 3 %
HCT: 25.4 % — ABNORMAL LOW (ref 36.0–46.0)
Hemoglobin: 8.6 g/dL — ABNORMAL LOW (ref 12.0–15.0)
Immature Granulocytes: 1 %
Lymphocytes Relative: 23 %
Lymphs Abs: 3.4 10*3/uL (ref 0.7–4.0)
MCH: 28.3 pg (ref 26.0–34.0)
MCHC: 33.9 g/dL (ref 30.0–36.0)
MCV: 83.6 fL (ref 80.0–100.0)
Monocytes Absolute: 2.2 10*3/uL — ABNORMAL HIGH (ref 0.1–1.0)
Monocytes Relative: 15 %
Neutro Abs: 8.7 10*3/uL — ABNORMAL HIGH (ref 1.7–7.7)
Neutrophils Relative %: 57 %
Platelets: 563 10*3/uL — ABNORMAL HIGH (ref 150–400)
RBC: 3.04 MIL/uL — ABNORMAL LOW (ref 3.87–5.11)
RDW: 19.1 % — ABNORMAL HIGH (ref 11.5–15.5)
WBC: 15 10*3/uL — ABNORMAL HIGH (ref 4.0–10.5)
nRBC: 0.1 % (ref 0.0–0.2)

## 2022-05-17 LAB — RENAL FUNCTION PANEL
Albumin: 2.4 g/dL — ABNORMAL LOW (ref 3.5–5.0)
Anion gap: 12 (ref 5–15)
BUN: 25 mg/dL — ABNORMAL HIGH (ref 8–23)
CO2: 25 mmol/L (ref 22–32)
Calcium: 9.6 mg/dL (ref 8.9–10.3)
Chloride: 99 mmol/L (ref 98–111)
Creatinine, Ser: 6.08 mg/dL — ABNORMAL HIGH (ref 0.44–1.00)
GFR, Estimated: 7 mL/min — ABNORMAL LOW (ref >60)
Glucose, Bld: 117 mg/dL — ABNORMAL HIGH (ref 70–99)
Phosphorus: 4.4 mg/dL (ref 2.5–4.6)
Potassium: 4.1 mmol/L (ref 3.5–5.1)
Sodium: 136 mmol/L (ref 135–145)

## 2022-05-17 LAB — GLUCOSE, CAPILLARY
Glucose-Capillary: 122 mg/dL — ABNORMAL HIGH (ref 70–99)
Glucose-Capillary: 138 mg/dL — ABNORMAL HIGH (ref 70–99)
Glucose-Capillary: 182 mg/dL — ABNORMAL HIGH (ref 70–99)
Glucose-Capillary: 188 mg/dL — ABNORMAL HIGH (ref 70–99)

## 2022-05-17 LAB — SURGICAL PCR SCREEN
MRSA, PCR: NEGATIVE
Staphylococcus aureus: NEGATIVE

## 2022-05-17 MED ORDER — CHLORHEXIDINE GLUCONATE 4 % EX LIQD
60.0000 mL | Freq: Once | CUTANEOUS | Status: AC
  Administered 2022-05-18: 4 via TOPICAL

## 2022-05-17 NOTE — Progress Notes (Signed)
PROGRESS NOTE   Carolyn Russo  AGT:[id number] DOB: May 17, 1951 DOA: 05/10/2022 PCP: [name], MD  Brief Narrative:   71 year old black female ESRD on HD MWF acute basal ganglia CVA left-sided weakness 12/17/8248 complicated by seizures?  05/07/2021 admission DM TY 2 HTN HLD HFrEF Peripheral arterial disease with associated ischemia Lower GI bleed normal EGD 04/19/2019 flex sig hemorrhoids Stool Recent admission 6/23 and 04/17/2022 dry gangrene of right great toe and underwent elective operation as per Dr. Guy Sandifer of podiatry--- return to ED with slightly worsening of the leg despite having completed 2 weeks of cefadroxil 1000 with HD Worsening gangrenous changes to right forefoot WBC 11 platelet 354 BUNs/creatinine 32/7.2 CRP 9.4 potassium 4.3 lactic acid 0.9 MRI obtained Patient started on vancomycin and cefepime  She has had some confusion and we are working that up for infectious sources  Hospital-Problem based course  Metabolic encephalopathy She has intermittent confusion Cxr/ammonia from 7/25 neg--prelim bc x 2 were neg Think this is delirium--she hadn't had a stool in several days also.  Enema given 7/24 , 2 stools Gangrene to right foot Prior PAD-recent elective right great toe amputation Vascular input appreciated-scheduled for BKA 7/26 Dr. Sharol Given after discussions with ortho/family Stop oxy--use dilaudid for pain given chance of build up of toxic metabolites  Continue vancomycin and cefepime for now-monitor leukocytosis Follow blood cult--if spikes fever would work up for Endocarditis ESRD MWF Renal input appreciated-continue Renvela 0.8 x 3 times daily Continue losartan 50 Basal ganglia CVA 04/20/2022 with left-sided weakness Underlying seizures Continue aspirin 81-secondary preventive measures Crestor 20 Continue Keppra 500 at bedtime Hypoglycemia 7/21 in known diabetes Cut back Lantus to 4 units and use sliding scale coverage Sugars 120-140 Prior lower GI  bleed-monitor trends Anemia of unknown etiology Patient has had slight drop in hemoglobin-transfusion threshold below 8--monitor 1 more day Get iron studies in am  DVT prophylaxis: TED hose Code Status: Full Family Communication: Discussed with Dr. [name] on 7/25 [[phone]] at bedsdie Disposition:  Status is: Inpatient Remains inpatient appropriate because: May require amputation   Consultants:  Renal Vascular  Procedures: Multiple  Antimicrobials: As above   Subjective:  Less confused but somenolent Husband present when I examined--she is calmer than prior but is still not completely coherent   Objective: Vitals:   05/17/22 0405 05/17/22 0822 05/17/22 0850 05/17/22 1300  BP: (!) 142/77 (!) 168/70  (!) 172/67  Pulse: 78 75  78  Resp: '17 16  18  '$ Temp: 98 F (36.7 C) 99.1 F (37.3 C)  98.8 F (37.1 C)  TempSrc: Oral Axillary    SpO2: 100%  95% 100%  Weight:      Height:        Intake/Output Summary (Last 24 hours) at 05/17/2022 1325 Last data filed at 05/16/2022 2000 Gross per 24 hour  Intake 120 ml  Output --  Net 120 ml    Filed Weights   05/13/22 1328 05/16/22 0708 05/16/22 1312  Weight: 85 kg 87 kg 84.5 kg    Examination:  Eomi ncat no focal deficit Access in R upper chest Abd soft nt nd  Wound looks about the same Neuro intact to movement --she follow some commands  Data Reviewed: personally reviewed   CBC    Component Value Date/Time   WBC 15.0 (H) 05/17/2022 0321   RBC 3.04 (L) 05/17/2022 0321   HGB 8.6 (L) 05/17/2022 0321   HGB 9.3 (L) 04/13/2022 1512   HCT 25.4 (L) 05/17/2022 0321   HCT 27.6 (  L) 04/13/2022 1512   PLT 563 (H) 05/17/2022 0321   PLT 341 04/13/2022 1512   MCV 83.6 05/17/2022 0321   MCV 89 04/13/2022 1512   MCH 28.3 05/17/2022 0321   MCHC 33.9 05/17/2022 0321   RDW 19.1 (H) 05/17/2022 0321   RDW 16.1 (H) 04/13/2022 1512   LYMPHSABS 3.4 05/17/2022 0321   LYMPHSABS 2.4 04/13/2022 1512   MONOABS 2.2 (H)  05/17/2022 0321   EOSABS 0.4 05/17/2022 0321   EOSABS 0.4 04/13/2022 1512   BASOSABS 0.1 05/17/2022 0321   BASOSABS 0.1 04/13/2022 1512      Latest Ref Rng & Units 05/17/2022    6:04 AM 05/16/2022    2:13 AM 05/15/2022    9:28 AM  CMP  Glucose 70 - 99 mg/dL 117  166  153   BUN 8 - 23 mg/dL 25  41  32   Creatinine 0.44 - 1.00 mg/dL 6.08  8.51  7.47   Sodium 135 - 145 mmol/L 136  135  135   Potassium 3.5 - 5.1 mmol/L 4.1  4.4  4.0   Chloride 98 - 111 mmol/L 99  95  96   CO2 22 - 32 mmol/L '25  24  26   '$ Calcium 8.9 - 10.3 mg/dL 9.6  9.0  9.1   Total Protein 6.5 - 8.1 g/dL   7.2   Total Bilirubin 0.3 - 1.2 mg/dL   0.5   Alkaline Phos 38 - 126 U/L   50   AST 15 - 41 U/L   20   ALT 0 - 44 U/L   22      Radiology Studies: DG CHEST PORT 1 VIEW  Result Date: 05/16/2022 CLINICAL DATA:  Fever, confusion, pneumonia EXAM: PORTABLE CHEST 1 VIEW COMPARISON:  Previous studies including the examination of 03/07/2022 FINDINGS: Transverse diameter heart is increased. Tip of dialysis catheter is seen in right atrium. There are no signs of alveolar pulmonary edema or new focal infiltrates. There is no pleural effusion or pneumothorax. IMPRESSION: There are no new infiltrates or signs of pulmonary edema. Electronically Signed   By: Elmer Picker M.D.   On: 05/16/2022 13:13     Scheduled Meds:  (feeding supplement) PROSource Plus  30 mL Oral BID BM   acetaminophen  1,000 mg Oral Q8H   aspirin  81 mg Oral Daily   carvedilol  3.125 mg Oral BID WC   Chlorhexidine Gluconate Cloth  6 each Topical Q0600   darbepoetin (ARANESP) injection - DIALYSIS  200 mcg Intravenous Q Fri-HD   feeding supplement (NEPRO CARB STEADY)  237 mL Oral BID BM   heparin injection (subcutaneous)  5,000 Units Subcutaneous Q8H   insulin aspart  0-6 Units Subcutaneous TID WC   insulin glargine-yfgn  4 Units Subcutaneous QHS   levETIRAcetam  500 mg Oral QHS   losartan  50 mg Oral Daily   methylphenidate  5 mg Oral Daily    mometasone-formoterol  2 puff Inhalation BID   pantoprazole  40 mg Oral Daily   rosuvastatin  20 mg Oral Daily   sevelamer carbonate  0.8 g Oral TID WC   Continuous Infusions:  ceFEPime (MAXIPIME) IV 1 g (05/16/22 1140)   vancomycin 1,000 mg (05/16/22 1110)     LOS: 7 days   Time spent: 2  Nita Sells, MD [facility] To contact the attending provider between 7A-7P or the covering provider during after hours 7P-7A, please log into the web site [email or web address] and access using universal   password for that web site. If you do not have the password, please call the hospital operator.  05/17/2022, 1:25 PM

## 2022-05-17 NOTE — Progress Notes (Signed)
  Progress Note    05/17/2022 9:18 AM * No surgery date entered *  Subjective:  Patient seems confused  Vitals:   05/17/22 0822 05/17/22 0850  BP: (!) 168/70   Pulse: 75   Resp: 16   Temp: 99.1 F (37.3 C)   SpO2:  95%    Physical Exam: Lungs:  nonlabored Extremities:  Right foot gangrene  CBC    Component Value Date/Time   WBC 15.0 (H) 05/17/2022 0321   RBC 3.04 (L) 05/17/2022 0321   HGB 8.6 (L) 05/17/2022 0321   HGB 9.3 (L) 04/13/2022 1512   HCT 25.4 (L) 05/17/2022 0321   HCT 27.6 (L) 04/13/2022 1512   PLT 563 (H) 05/17/2022 0321   PLT 341 04/13/2022 1512   MCV 83.6 05/17/2022 0321   MCV 89 04/13/2022 1512   MCH 28.3 05/17/2022 0321   MCHC 33.9 05/17/2022 0321   RDW 19.1 (H) 05/17/2022 0321   RDW 16.1 (H) 04/13/2022 1512   LYMPHSABS 3.4 05/17/2022 0321   LYMPHSABS 2.4 04/13/2022 1512   MONOABS 2.2 (H) 05/17/2022 0321   EOSABS 0.4 05/17/2022 0321   EOSABS 0.4 04/13/2022 1512   BASOSABS 0.1 05/17/2022 0321   BASOSABS 0.1 04/13/2022 1512    BMET    Component Value Date/Time   NA 136 05/17/2022 0604   NA 142 04/13/2022 1512   K 4.1 05/17/2022 0604   CL 99 05/17/2022 0604   CO2 25 05/17/2022 0604   GLUCOSE 117 (H) 05/17/2022 0604   BUN 25 (H) 05/17/2022 0604   BUN 12 04/13/2022 1512   CREATININE 6.08 (H) 05/17/2022 0604   CREATININE 0.84 10/13/2011 0903   CALCIUM 9.6 05/17/2022 0604   GFRNONAA 7 (L) 05/17/2022 0604   GFRNONAA 75 10/13/2011 0903   GFRAA 17 06/19/2020 0000   GFRAA 86 10/13/2011 0903    INR    Component Value Date/Time   INR 1.2 05/10/2022 1442     Intake/Output Summary (Last 24 hours) at 05/17/2022 4403 Last data filed at 05/16/2022 2000 Gross per 24 hour  Intake 120 ml  Output 2500 ml  Net -2380 ml     Assessment/Plan:  71 y.o. female is being followed for right foot gangrene    -Patient is confused, being worked up for possible infectious sources -Continued on vancomycin and cefepime -Currently scheduled for R BKA  with Dr.Duda on 7/26   Vicente Serene, PA-C [facility] [phone] 05/17/2022 9:18 AM

## 2022-05-17 NOTE — TOC Initial Note (Addendum)
Transition of Care [facility]) - Initial/Assessment Note    Patient Details  Name: Carolyn Russo MRN: [id number] Date of Birth: 11/07/1950  Transition of Care [facility]) CM/SW Contact:    Sharin Mons, RN Phone Number: 05/17/2022, 3:56 PM  Clinical Narrative:     Presented with R foot infection.     Pt slightly confused. Son @ bedside. NCM introduce self and explained role. Per son Carolyn Russo, pt is from home with husband. PTA received home health services provided by  [facility]. Son states they privately pay for [facility] in addition  however not sure of the schedule.  Home DME: cane, rollator, 3in1/ BSC.  Plan: ? BKA on 7/26  Son states they are hoping pt to transition to CIR post surgically.  TOC team following and will assist with needs...   Expected Discharge Plan: [place] (vs CIR vs SNF) Barriers to Discharge: Continued Medical Work up   Patient Goals and CMS Choice        Expected Discharge Plan and Services Expected Discharge Plan: [place] (vs CIR vs SNF)   Discharge Planning Services: CM Consult                               HH Arranged: RN, PT, Speech Therapy (PTA active with [place]) [place] Agency: [facility] [place]        Prior Living Arrangements/Services   Lives with:: Spouse Patient language and need for interpreter reviewed:: Yes        Need for Family Participation in Patient Care: Yes (Comment) Care giver support system in place?: Yes (comment) Current home services: Home PT, Home RN, Other (comment) (SLP) Criminal Activity/Legal Involvement Pertinent to Current Situation/Hospitalization: No - Comment as needed  Activities of Daily Living   ADL Screening (condition at time of admission) Patient's cognitive ability adequate to safely complete daily activities?: No Is the patient deaf or have difficulty hearing?: No Does the patient have difficulty seeing, even when wearing glasses/contacts?:  No Does the patient have difficulty concentrating, remembering, or making decisions?: Yes Patient able to express need for assistance with ADLs?: Yes Does the patient have difficulty dressing or bathing?: Yes Independently performs ADLs?: No Does the patient have difficulty walking or climbing stairs?: Yes Weakness of Legs: Both Weakness of Arms/Hands: Both  Permission Sought/Granted                  Emotional Assessment Appearance:: Appears stated age       Alcohol / Substance Use: Not Applicable Psych Involvement: No (comment)  Admission diagnosis:  Foot infection [L08.9] Gangrene of right foot ([place]) [I96] Patient Active Problem List   Diagnosis Date Noted   Foot infection 05/10/2022   PAD (peripheral artery disease) ([place]) 05/10/2022   Gangrene of right foot ([place]) 04/15/2022   Left hemiparesis ([place]) 09/24/2021   Abnormality of gait 09/24/2021   Pain of right hand 08/06/2021   Steal syndrome of dialysis vascular access ([place]) 08/06/2021   Subclavian steal syndrome of right subclavian artery 06/25/2021   Right hand weakness 06/25/2021   Stroke-like symptoms 05/06/2021   Bandemia    Cerebrovascular accident (CVA) of right basal ganglia ([place]) 04/20/2021   External hemorrhoids    Basal ganglia infarction ([place]) 04/16/2021   ESRD on dialysis ([place]) 04/16/2021   Hypertensive urgency 03/10/2021   Hilar enlargement    Anemia of chronic disease  Chronic diastolic CHF (congestive heart failure) ([place]) 03/09/2021   CKD (chronic kidney disease), stage V ([place]) 03/08/2021   Diabetic retinopathy ([place]) 03/08/2021   Hyperglycemia due to type 2 diabetes mellitus ([place]) 03/08/2021   Pure hypercholesterolemia 03/08/2021   Anemia in chronic kidney disease 09/24/2020   Uncontrolled type 2 diabetes mellitus with hyperglycemia ([place]) 03/19/2019   Osteopenia 09/04/2018   Migraine 09/04/2018   Asthma 09/04/2018   Pseudophakia of both eyes 07/17/2018   Pseudophakia of left eye 07/03/2018    Open angle with borderline findings and high glaucoma risk in left eye 07/03/2018   Age-related nuclear cataract of right eye 07/03/2018   CAD (coronary artery disease), native coronary artery 04/27/2017   Abnormal nuclear stress test 04/04/2017   Dyspnea on exertion 03/09/2017   Appendicitis    Age-related hypermature cataract of both eyes 12/20/2016   Uterine leiomyoma 11/27/2012   Dyslipidemia (high LDL; low HDL) 11/25/2011   Obesity (BMI 30-39.9) 11/25/2011   Hypertensive disorder 11/21/2011   Type 2 diabetes mellitus ([place]) 11/21/2011   Abnormal EKG 11/21/2011   PCP:  [name], MD Pharmacy:   [facility] [place], [address] AT [name] [address] Phone: [phone] Fax: [phone]  [facility] (New Address) - [place], [place] AT Previously: [address] [place] [address] Phone: [phone] Fax: [phone]     Social Determinants of Health ([place]) Interventions    Readmission Risk Interventions     No data to display

## 2022-05-17 NOTE — Progress Notes (Signed)
[facility] Progress Note   Subjective:   Seen and examined at bedside.  Remains confused but less agitated this AM. Not answering questions.  Husband present, feeding her breakfast.  Plan for BKA tomorrow.   Objective Vitals:   05/16/22 2150 05/17/22 0405 05/17/22 0822 05/17/22 0850  BP: 118/72 (!) 142/77 (!) 168/70   Pulse: 80 78 75   Resp: '20 17 16   '$ Temp: 98.3 F (36.8 C) 98 F (36.7 C) 99.1 F (37.3 C)   TempSrc: Oral Oral Axillary   SpO2: 100% 100%  95%  Weight:      Height:       Physical Exam General:chronically ill appearing, confused female in NAD Heart:RRR, no mrg Lungs:CTAB, nml WOB on RA Abdomen:soft, NTND Extremities:no LE edema. R foot gangrene, staples intact. No LE edema Dialysis Access: R IJ [facility] c/d/i   [facility] Weights   05/13/22 1328 05/16/22 0708 05/16/22 1312  Weight: 85 kg 87 kg 84.5 kg    Intake/Output Summary (Last 24 hours) at 05/17/2022 1021 Last data filed at 05/16/2022 2000 Gross per 24 hour  Intake 120 ml  Output 2500 ml  Net -2380 ml    Additional Objective Labs: Basic Metabolic Panel: Recent Labs  Lab 05/14/22 0347 05/15/22 0928 05/16/22 0213 05/17/22 0604  NA 134* 135 135 136  K 4.3 4.0 4.4 4.1  CL 96* 96* 95* 99  CO2 '26 26 24 25  '$ GLUCOSE 105* 153* 166* 117*  BUN 21 32* 41* 25*  CREATININE 5.24* 7.47* 8.51* 6.08*  CALCIUM 8.5* 9.1 9.0 9.6  PHOS 4.5  --  5.4* 4.4   Liver Function Tests: Recent Labs  Lab 05/10/22 1442 05/13/22 0540 05/15/22 0928 05/16/22 0213 05/17/22 0604  AST 28  --  20  --   --   ALT 21  --  22  --   --   ALKPHOS 57  --  50  --   --   BILITOT 0.9  --  0.5  --   --   PROT 7.6  --  7.2  --   --   ALBUMIN 3.1*   < > 2.4* 2.3* 2.4*   < > = values in this interval not displayed.   CBC: Recent Labs  Lab 05/13/22 0540 05/14/22 0347 05/15/22 0928 05/16/22 0213 05/17/22 0321  WBC 12.1* 13.5* 13.1* 14.7* 15.0*  NEUTROABS 7.1  --  7.8* 8.3* 8.7*  HGB 7.7* 8.4* 8.2* 7.8* 8.6*  HCT  22.7* 25.1* 24.2* 23.4* 25.4*  MCV 85.0 84.5 84.3 85.7 83.6  PLT 362 127* 463* 477* 563*   Blood Culture    Component Value Date/Time   SDES BLOOD BLOOD LEFT HAND 05/16/2022 1327   SPECREQUEST  05/16/2022 1327    BOTTLES DRAWN AEROBIC AND ANAEROBIC Blood Culture results may not be optimal due to an inadequate volume of blood received in culture bottles   CULT  05/16/2022 1327    NO GROWTH < 24 HOURS Performed at [facility], [place] [address] [place], [place] [address]    REPTSTATUS PENDING 05/16/2022 1327   CBG: Recent Labs  Lab 05/16/22 0600 05/16/22 1259 05/16/22 1639 05/16/22 2056 05/17/22 0836  GLUCAP 142* 145* 161* 121* 122*    Studies/Results: DG CHEST PORT 1 VIEW  Result Date: 05/16/2022 CLINICAL DATA:  Fever, confusion, pneumonia EXAM: PORTABLE CHEST 1 VIEW COMPARISON:  Previous studies including the examination of 03/07/2022 FINDINGS: Transverse diameter heart is increased. Tip of dialysis catheter is seen in  right atrium. There are no signs of alveolar pulmonary edema or new focal infiltrates. There is no pleural effusion or pneumothorax. IMPRESSION: There are no new infiltrates or signs of pulmonary edema. Electronically Signed   By: Elmer Picker M.D.   On: 05/16/2022 13:13    Medications:  ceFEPime (MAXIPIME) IV 1 g (05/16/22 1140)   vancomycin 1,000 mg (05/16/22 1110)    (feeding supplement) PROSource Plus  30 mL Oral BID BM   acetaminophen  1,000 mg Oral Q8H   aspirin  81 mg Oral Daily   carvedilol  3.125 mg Oral BID WC   Chlorhexidine Gluconate Cloth  6 each Topical Q0600   darbepoetin (ARANESP) injection - DIALYSIS  200 mcg Intravenous Q Fri-HD   doxercalciferol  2 mcg Intravenous Q M,W,F-HD   feeding supplement (NEPRO CARB STEADY)  237 mL Oral BID BM   heparin injection (subcutaneous)  5,000 Units Subcutaneous Q8H   insulin aspart  0-6 Units Subcutaneous TID WC   insulin glargine-yfgn  4 Units Subcutaneous QHS   levETIRAcetam  500 mg Oral  QHS   losartan  50 mg Oral Daily   methylphenidate  5 mg Oral Daily   mometasone-formoterol  2 puff Inhalation BID   pantoprazole  40 mg Oral Daily   rosuvastatin  20 mg Oral Daily   sevelamer carbonate  0.8 g Oral TID WC    Dialysis Orders: MWF - [place] 4 hrs 180NRE 350/700 88.5 kg 2.0 K/2.0 Ca UFP 4  -No heparin -Mircera 200 mcg  IV q 2wks - last 04/29/22 -Venofer '100mg'$  IV X 5 doses-new order, hasn't been given yet -Hectorol 18mg IV TIW   Assessment/Plan: 1. Gangrene R Foot. VVS consulted. Plan for R BKA tomorrow w/Dr. DSharol Given Vanc & Cefepime per primary.  2. AMS - likely 2/2 #1. WBC trending up. Afebrile. [facility] negative, repeat cultures collected yesterday. CXR w/no acute findings.  3. ESRD -MWF. If labs/volume stable post surgery tomorrow will hold off on HD until Thursday & resume regular schedule Friday. 4. Anemia - HGB 8.6. Aranesp 200 mcg IV with HD 05/13/2022. Holding Fe load in setting of infection.  5. Secondary hyperparathyroidism - phos 4.4. Continue binders, Corrected Ca+ 10.8. D/C VDRA for now. Recheck pth with next HD.  6. HTN/volume - BP elevated this AM.  Volume status improving, UF as tolerated. Under OP EDW if wts are accurate. Will need lower EDW on discharge post amputation.  7. Nutrition - Very low albumin. Add protein supplements.  8. DMT2-per rpimary 9. H/O CVA-ASA, plavix, Keppra  LJen Mow PA-C [place]7/25/2023,10:21 AM  LOS: 7 days

## 2022-05-17 NOTE — Care Management Important Message (Signed)
Important Message  Patient Details  Name: Carolyn Russo MRN: [id number] Date of Birth: September 29, 1951   Medicare Important Message Given:  Yes     Lavaya Defreitas [address] 05/17/2022, 3:38 PM

## 2022-05-17 NOTE — Plan of Care (Signed)
No acute events overnight. Problem: Education: Goal: Ability to describe self-care measures that may prevent or decrease complications (Diabetes Survival Skills Education) will improve Outcome: Progressing Goal: Individualized Educational Video(s) Outcome: Progressing   Problem: Coping: Goal: Ability to adjust to condition or change in health will improve Outcome: Progressing   Problem: Fluid Volume: Goal: Ability to maintain a balanced intake and output will improve Outcome: Progressing   Problem: Health Behavior/Discharge Planning: Goal: Ability to identify and utilize available resources and services will improve Outcome: Progressing Goal: Ability to manage health-related needs will improve Outcome: Progressing   Problem: Metabolic: Goal: Ability to maintain appropriate glucose levels will improve Outcome: Progressing   Problem: Nutritional: Goal: Maintenance of adequate nutrition will improve Outcome: Progressing Goal: Progress toward achieving an optimal weight will improve Outcome: Progressing   Problem: Skin Integrity: Goal: Risk for impaired skin integrity will decrease Outcome: Progressing   Problem: Tissue Perfusion: Goal: Adequacy of tissue perfusion will improve Outcome: Progressing   Problem: Education: Goal: Knowledge of General Education information will improve Description: Including pain rating scale, medication(s)/side effects and non-pharmacologic comfort measures Outcome: Progressing   Problem: Health Behavior/Discharge Planning: Goal: Ability to manage health-related needs will improve Outcome: Progressing   Problem: Clinical Measurements: Goal: Ability to maintain clinical measurements within normal limits will improve Outcome: Progressing Goal: Will remain free from infection Outcome: Progressing Goal: Diagnostic test results will improve Outcome: Progressing Goal: Respiratory complications will improve Outcome: Progressing Goal:  Cardiovascular complication will be avoided Outcome: Progressing   Problem: Activity: Goal: Risk for activity intolerance will decrease Outcome: Progressing   Problem: Nutrition: Goal: Adequate nutrition will be maintained Outcome: Progressing   Problem: Coping: Goal: Level of anxiety will decrease Outcome: Progressing   Problem: Elimination: Goal: Will not experience complications related to bowel motility Outcome: Progressing Goal: Will not experience complications related to urinary retention Outcome: Progressing   Problem: Pain Managment: Goal: General experience of comfort will improve Outcome: Progressing   Problem: Safety: Goal: Ability to remain free from injury will improve Outcome: Progressing   Problem: Skin Integrity: Goal: Risk for impaired skin integrity will decrease Outcome: Progressing   

## 2022-05-18 ENCOUNTER — Encounter (HOSPITAL_COMMUNITY)
Admission: EM | Disposition: A | Discharge status: Skilled Nursing Facility | Payer: Self-pay | Source: Home / Self Care | Attending: Family Medicine

## 2022-05-18 ENCOUNTER — Inpatient Hospital Stay (HOSPITAL_COMMUNITY): Payer: Medicare PPO | Admitting: Anesthesiology

## 2022-05-18 ENCOUNTER — Other Ambulatory Visit: Payer: Self-pay

## 2022-05-18 ENCOUNTER — Encounter (HOSPITAL_COMMUNITY): Payer: Self-pay | Admitting: Internal Medicine

## 2022-05-18 DIAGNOSIS — I509 Heart failure, unspecified: Secondary | ICD-10-CM | POA: Diagnosis not present

## 2022-05-18 DIAGNOSIS — N186 End stage renal disease: Secondary | ICD-10-CM | POA: Diagnosis not present

## 2022-05-18 DIAGNOSIS — E1152 Type 2 diabetes mellitus with diabetic peripheral angiopathy with gangrene: Secondary | ICD-10-CM | POA: Diagnosis not present

## 2022-05-18 DIAGNOSIS — I132 Hypertensive heart and chronic kidney disease with heart failure and with stage 5 chronic kidney disease, or end stage renal disease: Secondary | ICD-10-CM | POA: Diagnosis not present

## 2022-05-18 DIAGNOSIS — I2510 Atherosclerotic heart disease of native coronary artery without angina pectoris: Secondary | ICD-10-CM

## 2022-05-18 DIAGNOSIS — S88111A Complete traumatic amputation at level between knee and ankle, right lower leg, initial encounter: Secondary | ICD-10-CM | POA: Diagnosis not present

## 2022-05-18 DIAGNOSIS — I6381 Other cerebral infarction due to occlusion or stenosis of small artery: Secondary | ICD-10-CM | POA: Diagnosis not present

## 2022-05-18 DIAGNOSIS — L089 Local infection of the skin and subcutaneous tissue, unspecified: Secondary | ICD-10-CM | POA: Diagnosis not present

## 2022-05-18 DIAGNOSIS — I96 Gangrene, not elsewhere classified: Secondary | ICD-10-CM | POA: Diagnosis not present

## 2022-05-18 HISTORY — PX: AMPUTATION: SHX166

## 2022-05-18 LAB — RENAL FUNCTION PANEL
Albumin: 2.5 g/dL — ABNORMAL LOW (ref 3.5–5.0)
Anion gap: 16 — ABNORMAL HIGH (ref 5–15)
BUN: 42 mg/dL — ABNORMAL HIGH (ref 8–23)
CO2: 21 mmol/L — ABNORMAL LOW (ref 22–32)
Calcium: 9.8 mg/dL (ref 8.9–10.3)
Chloride: 98 mmol/L (ref 98–111)
Creatinine, Ser: 7.38 mg/dL — ABNORMAL HIGH (ref 0.44–1.00)
GFR, Estimated: 5 mL/min — ABNORMAL LOW (ref >60)
Glucose, Bld: 187 mg/dL — ABNORMAL HIGH (ref 70–99)
Phosphorus: 5.3 mg/dL — ABNORMAL HIGH (ref 2.5–4.6)
Potassium: 4 mmol/L (ref 3.5–5.1)
Sodium: 135 mmol/L (ref 135–145)

## 2022-05-18 LAB — FOLATE: Folate: 7.2 ng/mL (ref >5.9)

## 2022-05-18 LAB — CBC
HCT: 27.9 % — ABNORMAL LOW (ref 36.0–46.0)
Hemoglobin: 9.2 g/dL — ABNORMAL LOW (ref 12.0–15.0)
MCH: 28.6 pg (ref 26.0–34.0)
MCHC: 33 g/dL (ref 30.0–36.0)
MCV: 86.6 fL (ref 80.0–100.0)
Platelets: 570 10*3/uL — ABNORMAL HIGH (ref 150–400)
RBC: 3.22 MIL/uL — ABNORMAL LOW (ref 3.87–5.11)
RDW: 19.6 % — ABNORMAL HIGH (ref 11.5–15.5)
WBC: 16.4 10*3/uL — ABNORMAL HIGH (ref 4.0–10.5)
nRBC: 0.2 % (ref 0.0–0.2)

## 2022-05-18 LAB — GLUCOSE, CAPILLARY
Glucose-Capillary: 181 mg/dL — ABNORMAL HIGH (ref 70–99)
Glucose-Capillary: 173 mg/dL — ABNORMAL HIGH (ref 70–99)
Glucose-Capillary: 125 mg/dL — ABNORMAL HIGH (ref 70–99)
Glucose-Capillary: 129 mg/dL — ABNORMAL HIGH (ref 70–99)
Glucose-Capillary: 189 mg/dL — ABNORMAL HIGH (ref 70–99)
Glucose-Capillary: 175 mg/dL — ABNORMAL HIGH (ref 70–99)

## 2022-05-18 LAB — VITAMIN B12: Vitamin B-12: 734 pg/mL (ref 180–914)

## 2022-05-18 LAB — IRON AND TIBC
Iron: 44 ug/dL (ref 28–170)
Saturation Ratios: 29 % (ref 10.4–31.8)
TIBC: 153 ug/dL — ABNORMAL LOW (ref 250–450)
UIBC: 109 ug/dL

## 2022-05-18 LAB — RETICULOCYTES
Immature Retic Fract: 43.8 % — ABNORMAL HIGH (ref 2.3–15.9)
RBC.: 3.29 MIL/uL — ABNORMAL LOW (ref 3.87–5.11)
Retic Count, Absolute: 108.2 10*3/uL (ref 19.0–186.0)
Retic Ct Pct: 3.3 % — ABNORMAL HIGH (ref 0.4–3.1)

## 2022-05-18 LAB — FERRITIN: Ferritin: 1215 ng/mL — ABNORMAL HIGH (ref 11–307)

## 2022-05-18 SURGERY — AMPUTATION BELOW KNEE
Anesthesia: Monitor Anesthesia Care | Site: Knee | Laterality: Right

## 2022-05-18 MED ORDER — HYDROCODONE-ACETAMINOPHEN 7.5-325 MG PO TABS: 1.0000 | ORAL_TABLET | ORAL | Status: DC | PRN

## 2022-05-18 MED ORDER — ALUM & MAG HYDROXIDE-SIMETH 200-200-20 MG/5ML PO SUSP
15.0000 mL | ORAL | Status: DC | PRN
  Administered 2022-05-21 – 2022-05-27 (×3): 30 mL via ORAL
  Filled 2022-05-18 (×4): qty 30

## 2022-05-18 MED ORDER — BUPIVACAINE HCL (PF) 0.5 % IJ SOLN
INTRAMUSCULAR | Status: DC | PRN
  Administered 2022-05-18 (×2): 15 mL via PERINEURAL

## 2022-05-18 MED ORDER — JUVEN PO PACK
1.0000 | PACK | Freq: Two times a day (BID) | ORAL | Status: DC
  Administered 2022-05-19 – 2022-06-10 (×31): 1 via ORAL
  Filled 2022-05-18 (×32): qty 1

## 2022-05-18 MED ORDER — PHENYLEPHRINE 80 MCG/ML (10ML) SYRINGE FOR IV PUSH (FOR BLOOD PRESSURE SUPPORT)
PREFILLED_SYRINGE | INTRAVENOUS | Status: DC | PRN
  Administered 2022-05-18: 80 ug via INTRAVENOUS
  Administered 2022-05-18: 160 ug via INTRAVENOUS
  Administered 2022-05-18: 80 ug via INTRAVENOUS

## 2022-05-18 MED ORDER — MORPHINE SULFATE (PF) 2 MG/ML IV SOLN
0.5000 mg | INTRAVENOUS | Status: DC | PRN
  Administered 2022-06-09: 1 mg via INTRAVENOUS
  Filled 2022-05-18: qty 1

## 2022-05-18 MED ORDER — PROPOFOL 500 MG/50ML IV EMUL
INTRAVENOUS | Status: DC | PRN
  Administered 2022-05-18: 100 ug/kg/min via INTRAVENOUS

## 2022-05-18 MED ORDER — 0.9 % SODIUM CHLORIDE (POUR BTL) OPTIME
TOPICAL | Status: DC | PRN
  Administered 2022-05-18: 1000 mL

## 2022-05-18 MED ORDER — HYDROCODONE-ACETAMINOPHEN 5-325 MG PO TABS: 1.0000 | ORAL_TABLET | ORAL | Status: DC | PRN

## 2022-05-18 MED ORDER — MORPHINE SULFATE (PF) 2 MG/ML IV SOLN: 0.5000 mg | INTRAVENOUS | Status: DC | PRN

## 2022-05-18 MED ORDER — PHENYLEPHRINE 80 MCG/ML (10ML) SYRINGE FOR IV PUSH (FOR BLOOD PRESSURE SUPPORT)
PREFILLED_SYRINGE | INTRAVENOUS | Status: AC
  Filled 2022-05-18: qty 10

## 2022-05-18 MED ORDER — BUPIVACAINE LIPOSOME 1.3 % IJ SUSP
INTRAMUSCULAR | Status: DC | PRN
  Administered 2022-05-18: 10 mL via PERINEURAL

## 2022-05-18 MED ORDER — CHLORHEXIDINE GLUCONATE CLOTH 2 % EX PADS: 6.0000 | MEDICATED_PAD | Freq: Every day | CUTANEOUS | Status: DC

## 2022-05-18 MED ORDER — PANTOPRAZOLE SODIUM 40 MG PO TBEC: 40.0000 mg | DELAYED_RELEASE_TABLET | Freq: Every day | ORAL | Status: DC

## 2022-05-18 MED ORDER — MIDAZOLAM HCL 2 MG/2ML IJ SOLN
INTRAMUSCULAR | Status: AC
  Filled 2022-05-18: qty 2

## 2022-05-18 MED ORDER — ACETAMINOPHEN 325 MG PO TABS
325.0000 mg | ORAL_TABLET | Freq: Four times a day (QID) | ORAL | Status: DC | PRN
  Administered 2022-05-30 – 2022-06-08 (×4): 650 mg via ORAL
  Filled 2022-05-18 (×4): qty 2

## 2022-05-18 MED ORDER — POLYETHYLENE GLYCOL 3350 17 G PO PACK
17.0000 g | PACK | Freq: Every day | ORAL | Status: DC | PRN
  Administered 2022-06-05: 17 g via ORAL
  Filled 2022-05-18: qty 1

## 2022-05-18 MED ORDER — BISACODYL 5 MG PO TBEC
5.0000 mg | DELAYED_RELEASE_TABLET | Freq: Every day | ORAL | Status: DC | PRN
  Administered 2022-05-24: 5 mg via ORAL
  Filled 2022-05-18: qty 1

## 2022-05-18 MED ORDER — PROPOFOL 10 MG/ML IV BOLUS
INTRAVENOUS | Status: AC
Start: 2022-05-18 — End: ?
  Filled 2022-05-18: qty 20

## 2022-05-18 MED ORDER — CEFAZOLIN SODIUM-DEXTROSE 2-4 GM/100ML-% IV SOLN: 2.0000 g | Freq: Three times a day (TID) | INTRAVENOUS | Status: DC

## 2022-05-18 MED ORDER — DOCUSATE SODIUM 100 MG PO CAPS
100.0000 mg | ORAL_CAPSULE | Freq: Every day | ORAL | Status: DC
  Administered 2022-05-19 – 2022-06-09 (×16): 100 mg via ORAL
  Filled 2022-05-18 (×22): qty 1

## 2022-05-18 MED ORDER — FENTANYL CITRATE (PF) 100 MCG/2ML IJ SOLN
INTRAMUSCULAR | Status: AC
  Filled 2022-05-18: qty 2

## 2022-05-18 MED ORDER — GUAIFENESIN-DM 100-10 MG/5ML PO SYRP: 15.0000 mL | ORAL_SOLUTION | ORAL | Status: DC | PRN

## 2022-05-18 MED ORDER — OXYCODONE HCL 5 MG PO TABS: 5.0000 mg | ORAL_TABLET | Freq: Once | ORAL | Status: DC | PRN

## 2022-05-18 MED ORDER — ASCORBIC ACID 500 MG PO TABS
1000.0000 mg | ORAL_TABLET | Freq: Every day | ORAL | Status: DC
Start: 2022-05-19 — End: 2022-06-10
  Administered 2022-05-19 – 2022-06-10 (×21): 1000 mg via ORAL
  Filled 2022-05-18 (×23): qty 2

## 2022-05-18 MED ORDER — ZINC SULFATE 220 (50 ZN) MG PO CAPS
220.0000 mg | ORAL_CAPSULE | Freq: Every day | ORAL | Status: AC
  Administered 2022-05-19 – 2022-06-01 (×14): 220 mg via ORAL
  Filled 2022-05-18 (×14): qty 1

## 2022-05-18 MED ORDER — POTASSIUM CHLORIDE CRYS ER 20 MEQ PO TBCR: 20.0000 meq | EXTENDED_RELEASE_TABLET | Freq: Every day | ORAL | Status: DC | PRN

## 2022-05-18 MED ORDER — ONDANSETRON HCL 4 MG/2ML IJ SOLN: 4.0000 mg | Freq: Once | INTRAMUSCULAR | Status: DC | PRN

## 2022-05-18 MED ORDER — PHENOL 1.4 % MT LIQD: 1.0000 | OROMUCOSAL | Status: DC | PRN

## 2022-05-18 MED ORDER — HYDRALAZINE HCL 20 MG/ML IJ SOLN: 5.0000 mg | INTRAMUSCULAR | Status: DC | PRN

## 2022-05-18 MED ORDER — LABETALOL HCL 5 MG/ML IV SOLN: 10.0000 mg | INTRAVENOUS | Status: DC | PRN

## 2022-05-18 MED ORDER — METOPROLOL TARTRATE 5 MG/5ML IV SOLN: 2.0000 mg | INTRAVENOUS | Status: DC | PRN

## 2022-05-18 MED ORDER — FENTANYL CITRATE PF 50 MCG/ML IJ SOSY
25.0000 ug | PREFILLED_SYRINGE | Freq: Once | INTRAMUSCULAR | Status: AC
  Administered 2022-05-18: 25 ug via INTRAVENOUS

## 2022-05-18 MED ORDER — FENTANYL CITRATE (PF) 250 MCG/5ML IJ SOLN
INTRAMUSCULAR | Status: AC
  Filled 2022-05-18: qty 5

## 2022-05-18 MED ORDER — SODIUM CHLORIDE 0.9 % IV SOLN
INTRAVENOUS | Status: DC
Start: 2022-05-18 — End: 2022-05-25

## 2022-05-18 MED ORDER — OXYCODONE HCL 5 MG/5ML PO SOLN: 5.0000 mg | Freq: Once | ORAL | Status: DC | PRN

## 2022-05-18 MED ORDER — ONDANSETRON HCL 4 MG/2ML IJ SOLN
4.0000 mg | Freq: Four times a day (QID) | INTRAMUSCULAR | Status: DC | PRN
  Administered 2022-05-25 – 2022-06-10 (×8): 4 mg via INTRAVENOUS
  Filled 2022-05-18 (×8): qty 2

## 2022-05-18 MED ORDER — FENTANYL CITRATE (PF) 100 MCG/2ML IJ SOLN: 25.0000 ug | INTRAMUSCULAR | Status: DC | PRN

## 2022-05-18 MED ORDER — SODIUM CHLORIDE 0.9 % IV SOLN: INTRAVENOUS | Status: DC | PRN

## 2022-05-18 MED ORDER — MAGNESIUM SULFATE 2 GM/50ML IV SOLN: 2.0000 g | Freq: Every day | INTRAVENOUS | Status: DC | PRN

## 2022-05-18 MED ORDER — LOSARTAN POTASSIUM 50 MG PO TABS
100.0000 mg | ORAL_TABLET | Freq: Every day | ORAL | Status: DC
  Administered 2022-05-20 – 2022-06-09 (×18): 100 mg via ORAL
  Filled 2022-05-18 (×21): qty 2

## 2022-05-18 MED ORDER — MAGNESIUM CITRATE PO SOLN: 1.0000 | Freq: Once | ORAL | Status: DC | PRN

## 2022-05-18 SURGICAL SUPPLY — 41 items
BAG COUNTER SPONGE SURGICOUNT (BAG) IMPLANT
BAG SPNG CNTER NS LX DISP (BAG)
BLADE SAW RECIP 87.9 MT (BLADE) ×3 IMPLANT
BLADE SURG 21 STRL SS (BLADE) ×3 IMPLANT
BNDG COHESIVE 6X5 TAN STRL LF (GAUZE/BANDAGES/DRESSINGS) IMPLANT
CANISTER WOUND CARE 500ML ATS (WOUND CARE) ×3 IMPLANT
CANISTER WOUNDNEG PRESSURE 500 (CANNISTER) ×1 IMPLANT
COVER SURGICAL LIGHT HANDLE (MISCELLANEOUS) ×3 IMPLANT
CUFF TOURN SGL QUICK 34 (TOURNIQUET CUFF) ×2
CUFF TRNQT CYL 34X4.125X (TOURNIQUET CUFF) ×2 IMPLANT
DRAPE INCISE IOBAN 66X45 STRL (DRAPES) ×3 IMPLANT
DRAPE U-SHAPE 47X51 STRL (DRAPES) ×3 IMPLANT
DRESSING PREVENA PLUS CUSTOM (GAUZE/BANDAGES/DRESSINGS) ×2 IMPLANT
DRSG PREVENA PLUS CUSTOM (GAUZE/BANDAGES/DRESSINGS) ×2 IMPLANT
DURAPREP 26ML APPLICATOR (WOUND CARE) ×3 IMPLANT
ELECT REM PT RETURN 9FT ADLT (ELECTROSURGICAL) ×2 IMPLANT
ELECTRODE REM PT RTRN 9FT ADLT (ELECTROSURGICAL) ×2 IMPLANT
GLOVE BIOGEL PI IND STRL 9 (GLOVE) ×2 IMPLANT
GLOVE BIOGEL PI INDICATOR 9 (GLOVE) ×2
GLOVE SURG ORTHO 9.0 STRL STRW (GLOVE) ×3 IMPLANT
GOWN STRL REUS W/ TWL XL LVL3 (GOWN DISPOSABLE) ×4 IMPLANT
GOWN STRL REUS W/TWL XL LVL3 (GOWN DISPOSABLE) ×4
GRAFT SKIN WND MICRO 38 (Tissue) ×1 IMPLANT
GRAFT SKIN WND OMEGA3 SB 7X10 (Tissue) ×1 IMPLANT
KIT BASIN OR (CUSTOM PROCEDURE TRAY) ×3 IMPLANT
KIT TURNOVER KIT B (KITS) ×3 IMPLANT
MANIFOLD NEPTUNE II (INSTRUMENTS) ×3 IMPLANT
NS IRRIG 1000ML POUR BTL (IV SOLUTION) ×3 IMPLANT
PACK ORTHO EXTREMITY (CUSTOM PROCEDURE TRAY) ×3 IMPLANT
PAD ARMBOARD 7.5X6 YLW CONV (MISCELLANEOUS) ×3 IMPLANT
PREVENA RESTOR ARTHOFORM 46X30 (CANNISTER) ×3 IMPLANT
SPONGE T-LAP 18X18 ~~LOC~~+RFID (SPONGE) IMPLANT
STAPLER VISISTAT 35W (STAPLE) IMPLANT
STOCKINETTE IMPERVIOUS LG (DRAPES) ×3 IMPLANT
SUT ETHILON 2 0 PSLX (SUTURE) IMPLANT
SUT SILK 2 0 (SUTURE) ×2
SUT SILK 2-0 18XBRD TIE 12 (SUTURE) ×2 IMPLANT
SUT VIC AB 1 CTX 27 (SUTURE) ×6 IMPLANT
TOWEL GREEN STERILE (TOWEL DISPOSABLE) ×3 IMPLANT
TUBE CONNECTING 12X1/4 (SUCTIONS) ×3 IMPLANT
YANKAUER SUCT BULB TIP NO VENT (SUCTIONS) ×3 IMPLANT

## 2022-05-18 NOTE — Progress Notes (Signed)
[name] Progress Note   Subjective:   Patient seen and examined at bedside post surgery.  Opens eyes to verbal stimuli but does not follow commands or answer questions appropriately.  Husband reports she told him she was hungry.  Her son is coming to assist with eating lunch.     Objective Vitals:   05/18/22 1120 05/18/22 1135 05/18/22 1150 05/18/22 1209  BP: (!) 143/65 (!) 159/68 (!) 156/64 (!) 134/110  Pulse: 72 73 73 75  Resp: '17 12 17 16  '$ Temp:   35.3 F (36.6 C) 98 F (36.7 C)  TempSrc:    Oral  SpO2: 95% 99% 100% 100%  Weight:      Height:       Physical Exam General:chronically ill appearing female in NAD Heart:RRR, no mrg Lungs:CTAB, nml WOB on RA Abdomen:soft, NTND Extremities:no edema on L, R stump in cannister Dialysis Access: R IJ [facility]  Filed Weights   05/16/22 0708 05/16/22 1312 05/18/22 0825  Weight: 87 kg 84.5 kg 84.5 kg    Intake/Output Summary (Last 24 hours) at 05/18/2022 1326 Last data filed at 05/18/2022 1056 Gross per 24 hour  Intake 650 ml  Output 100 ml  Net 550 ml    Additional Objective Labs: Basic Metabolic Panel: Recent Labs  Lab 05/16/22 0213 05/17/22 0604 05/18/22 0402  NA 135 136 135  K 4.4 4.1 4.0  CL 95* 99 98  CO2 24 25 21*  GLUCOSE 166* 117* 187*  BUN 41* 25* 42*  CREATININE 8.51* 6.08* 7.38*  CALCIUM 9.0 9.6 9.8  PHOS 5.4* 4.4 5.3*   Liver Function Tests: Recent Labs  Lab 05/15/22 0928 05/16/22 0213 05/17/22 0604 05/18/22 0402  AST 20  --   --   --   ALT 22  --   --   --   ALKPHOS 50  --   --   --   BILITOT 0.5  --   --   --   PROT 7.2  --   --   --   ALBUMIN 2.4* 2.3* 2.4* 2.5*   CBC: Recent Labs  Lab 05/14/22 0347 05/15/22 0928 05/16/22 0213 05/17/22 0321 05/18/22 0400  WBC 13.5* 13.1* 14.7* 15.0* 16.4*  NEUTROABS  --  7.8* 8.3* 8.7*  --   HGB 8.4* 8.2* 7.8* 8.6* 9.2*  HCT 25.1* 24.2* 23.4* 25.4* 27.9*  MCV 84.5 84.3 85.7 83.6 86.6  PLT 127* 463* 477* 563* 570*   Blood  Culture    Component Value Date/Time   SDES BLOOD BLOOD LEFT HAND 05/16/2022 1327   SPECREQUEST  05/16/2022 1327    BOTTLES DRAWN AEROBIC AND ANAEROBIC Blood Culture results may not be optimal due to an inadequate volume of blood received in culture bottles   CULT  05/16/2022 1327    NO GROWTH 2 DAYS Performed at [facility], [address], [place], [place] [address]    REPTSTATUS PENDING 05/16/2022 1327   CBG: Recent Labs  Lab 05/17/22 2005 05/18/22 0454 05/18/22 0801 05/18/22 1008 05/18/22 1104  GLUCAP 188* 181* 173* 125* 129*   Iron Studies:  Recent Labs    05/18/22 0400  IRON 44  TIBC 153*  FERRITIN 1,215*   Lab Results  Component Value Date   INR 1.2 05/10/2022   INR 1.1 04/13/2022   INR 1.0 04/14/2021   Studies/Results: No results found.  Medications:  sodium chloride     ceFEPime (MAXIPIME) IV Stopped (05/17/22 2147)   magnesium sulfate bolus  IVPB     vancomycin 1,000 mg (05/16/22 1110)    (feeding supplement) PROSource Plus  30 mL Oral BID BM   acetaminophen  1,000 mg Oral Q8H   [START ON 05/19/2022] vitamin C  1,000 mg Oral Daily   aspirin  81 mg Oral Daily   carvedilol  3.125 mg Oral BID WC   Chlorhexidine Gluconate Cloth  6 each Topical Q0600   darbepoetin (ARANESP) injection - DIALYSIS  200 mcg Intravenous Q Fri-HD   [START ON 05/19/2022] docusate sodium  100 mg Oral Daily   feeding supplement (NEPRO CARB STEADY)  237 mL Oral BID BM   fentaNYL       heparin injection (subcutaneous)  5,000 Units Subcutaneous Q8H   insulin aspart  0-6 Units Subcutaneous TID WC   insulin glargine-yfgn  4 Units Subcutaneous QHS   levETIRAcetam  500 mg Oral QHS   losartan  50 mg Oral Daily   methylphenidate  5 mg Oral Daily   midazolam       mometasone-formoterol  2 puff Inhalation BID   nutrition supplement (JUVEN)  1 packet Oral BID BM   pantoprazole  40 mg Oral Daily   rosuvastatin  20 mg Oral Daily   sevelamer carbonate  0.8 g Oral TID WC   [START ON  05/19/2022] zinc sulfate  220 mg Oral Daily    Dialysis Orders: MWF - [place] 4 hrs 180NRE 350/700 88.5 kg 2.0 K/2.0 Ca UFP 4  -No heparin -Mircera 200 mcg  IV q 2wks - last 04/29/22 -Venofer '100mg'$  IV X 5 doses-new order, hasn't been given yet -Hectorol 65mg IV TIW   Assessment/Plan: 1. Gangrene R Foot. VVS consulted. R BKA today w/Dr. DSharol Given Vanc & Cefepime per primary.  2. AMS - likely 2/2 #1. WBC trending up. Afebrile. BC 7/18 negative, repeat cultures collected 7/24 w/NGTD. CXR w/no acute findings.  3. ESRD - on MWF. Labs/volume stable today.  Plan for HD tomorrow off schedule.  4. Anemia - HGB 9.2 pre surgery. Aranesp 200 mcg IV with HD 05/13/2022. Holding Fe load in setting of infection.  5. Secondary hyperparathyroidism - phos 5.3. Continue binders, Corrected Ca+ 10.8. D/C VDRA for now. Recheck pth with next HD.  6. HTN/volume - BP elevated this AM.  On carvedilol 3.125 mg BID and losartan '50mg'$  qd. Increase losartan to '100mg'$  qd.  Volume status improving, UF as tolerated. Under OP EDW if wts are accurate. Will need lower EDW on discharge post amputation.  7. Nutrition - Very low albumin. Add protein supplements. Renal diet w/fluid restrictions. 8. DMT2-per rpimary 9. H/O CVA-ASA, plavix, Keppra  LJen Mow PA-C [place]7/26/2023,1:26 PM  LOS: 8 days

## 2022-05-18 NOTE — Op Note (Signed)
05/18/2022  11:04 AM  PATIENT:  Carolyn Russo    PRE-OPERATIVE DIAGNOSIS:  GANGRENE RIGHT FOOT  POST-OPERATIVE DIAGNOSIS:  Same  PROCEDURE:  RIGHT BELOW KNEE AMPUTATION Application of Kerecis tissue graft 38 cm micro powder and 70 cm sheet.  Closure Application of Prevena customizable and arthroform wound VAC.  EXTR  SURGEON:  Newt Minion, MD  ANESTHESIA:   General  PREOPERATIVE INDICATIONS:  [name] is a  71 y.o. female with a diagnosis of GANGRENE RIGHT FOOT who failed conservative measures and elected for surgical management.    The risks benefits and alternatives were discussed with the patient preoperatively including but not limited to the risks of infection, bleeding, nerve injury, cardiopulmonary complications, the need for revision surgery, among others, and the patient was willing to proceed.  OPERATIVE IMPLANTS: Right Kerecis micro powder 38 cm and Kerecis sheet 70 cm.   OPERATIVE FINDINGS: Patient had calcified arteries good petechial bleeding in the muscle good contractility and color.  (  OPERATIVE PROCEDURE: Patient was brought to the operating room after undergoing a regional anesthetic.  After adequate levels anesthesia were obtained a thigh tourniquet was placed and the lower extremity was prepped using DuraPrep draped into a sterile field. The foot was draped out of the sterile field with impervious stockinette.  A timeout was called and the tourniquet inflated.  A transverse skin incision was made 12 cm distal to the tibial tubercle, the incision curved proximally, and a large posterior flap was created.  The tibia was transected just proximal to the skin incision and beveled anteriorly.  The fibula was transected just proximal to the tibial incision.  The sciatic nerve was pulled cut and allowed to retract.  The vascular bundles were suture ligated with 2-0 silk.  The tourniquet was deflated and hemostasis obtained.  The area Kerecis micro powder was  applied to the soft tissue at the end of the tibia and fibula.  This was then covered with a microsheath.  This was stapled to secure in place in the fascial layers were closed over the port.  Kerecis tissue graft.  Negative pressure wound VAC the deep and superficial fascial layers were closed using #1 Vicryl.  The skin was closed using staples.  The Prevena customizable dressing was applied this was overwrapped with the arthroform sponge.  Charlie Pitter was used to secure the sponges and the circumferential compression was secured to the skin with Dermatac.  This was connected to the wound VAC pump and had a good suction fit this was covered with a stump shrinker and a limb protector.  Patient was taken to the PACU in stable condition.   DISCHARGE PLANNING:  Antibiotic duration: 24-hour antibiotics  Weightbearing: Nonweightbearing on the operative extremity  Pain medication: Opioid pathway  Dressing care/ Wound VAC: Continue wound VAC with the Prevena plus pump at discharge for 1 week  Ambulatory devices: Walker or kneeling scooter  Discharge to: Discharge planning based on recommendations per physical therapy  Follow-up: In the office 1 week after discharge.

## 2022-05-18 NOTE — Anesthesia Procedure Notes (Signed)
Anesthesia Regional Block: Adductor canal block   Pre-Anesthetic Checklist: , timeout performed,  Correct Patient, Correct Site, Correct Laterality,  Correct Procedure, Correct Position, site marked,  Risks and benefits discussed,  Surgical consent,  Pre-op evaluation,  At surgeon's request and post-op pain management  Laterality: Right  Prep: chloraprep       Needles:  Injection technique: Single-shot  Needle Type: Echogenic Stimulator Needle     Needle Length: 10cm  Needle Gauge: 20     Additional Needles:   Procedures:,,,, ultrasound used (permanent image in chart),,    Narrative:  Start time: 05/18/2022 9:02 AM End time: 05/18/2022 9:05 AM Injection made incrementally with aspirations every 5 mL.  Performed by: Personally  Anesthesiologist: Lidia Collum, MD  Additional Notes: Standard monitors applied. Skin prepped. Good needle visualization with ultrasound. Injection made in 5cc increments with no resistance to injection. Patient tolerated the procedure well.

## 2022-05-18 NOTE — Anesthesia Procedure Notes (Signed)
Anesthesia Regional Block: Popliteal block   Pre-Anesthetic Checklist: , timeout performed,  Correct Patient, Correct Site, Correct Laterality,  Correct Procedure, Correct Position, site marked,  Risks and benefits discussed,  Surgical consent,  Pre-op evaluation,  At surgeon's request and post-op pain management  Laterality: Right  Prep: chloraprep       Needles:  Injection technique: Single-shot  Needle Type: Echogenic Stimulator Needle     Needle Length: 10cm  Needle Gauge: 20     Additional Needles:   Procedures:,,,, ultrasound used (permanent image in chart),,    Narrative:  Start time: 05/18/2022 9:05 AM End time: 05/18/2022 9:07 AM  Performed by: Personally  Anesthesiologist: Lidia Collum, MD  Additional Notes: Standard monitors applied. Skin prepped. Good needle visualization with ultrasound. Injection made in 5cc increments with no resistance to injection. Patient tolerated the procedure well.

## 2022-05-18 NOTE — Transfer of Care (Signed)
Immediate Anesthesia Transfer of Care Note  Patient: Eva F Slatten  Procedure(s) Performed: RIGHT BELOW KNEE AMPUTATION (Right: Knee)  Patient Location: PACU  Anesthesia Type:MAC and Regional  Level of Consciousness: drowsy and patient cooperative  Airway & Oxygen Therapy: Patient Spontanous Breathing  Post-op Assessment: Report given to RN  Post vital signs: Reviewed and stable  Last Vitals:  Vitals Value Taken Time  BP 139/116 05/18/22 1103  Temp    Pulse 75 05/18/22 1105  Resp 28   SpO2 98 % 05/18/22 1105  Vitals shown include unvalidated device data.  Last Pain:  Vitals:   05/18/22 0825  TempSrc: Oral  PainSc:          Complications: No notable events documented.

## 2022-05-18 NOTE — Interval H&P Note (Signed)
History and Physical Interval Note:  05/18/2022 6:50 AM  Carolyn Russo  has presented today for surgery, with the diagnosis of GANGRENE RIGHT FOOT.  The various methods of treatment have been discussed with the patient and family. After consideration of risks, benefits and other options for treatment, the patient has consented to  Procedure(s): RIGHT BELOW KNEE AMPUTATION (Right) as a surgical intervention.  The patient's history has been reviewed, patient examined, no change in status, stable for surgery.  I have reviewed the patient's chart and labs.  Questions were answered to the patient's satisfaction.     Newt Minion

## 2022-05-18 NOTE — Anesthesia Postprocedure Evaluation (Signed)
Anesthesia Post Note  Patient: Carolyn Russo  Procedure(s) Performed: RIGHT BELOW KNEE AMPUTATION (Right: Knee)     Patient location during evaluation: PACU Anesthesia Type: Regional Level of consciousness: awake and alert Pain management: pain level controlled Vital Signs Assessment: post-procedure vital signs reviewed and stable Respiratory status: spontaneous breathing, nonlabored ventilation and respiratory function stable Cardiovascular status: blood pressure returned to baseline and stable Postop Assessment: no apparent nausea or vomiting Anesthetic complications: no   No notable events documented.  Last Vitals:  Vitals:   05/18/22 1150 05/18/22 1209  BP: (!) 156/64 (!) 134/110  Pulse: 73 75  Resp: 17 16  Temp: 36.6 C 36.7 C  SpO2: 100% 100%    Last Pain:  Vitals:   05/18/22 1209  TempSrc: Oral  PainSc: [facility]

## 2022-05-18 NOTE — Progress Notes (Signed)
[facility]                                                                              [name], is a 71 y.o. female, DOB - 15-Oct-1951, CVE:[id number] Admit date - 05/10/2022    Outpatient Primary MD for the patient is Carolyn Chard, MD  LOS - 8  days  Chief Complaint  Patient presents with   Cellulitis       Brief summary   Patient is a 71 year old female with ESRD on HD MWF, acute basal ganglia CVA with left-sided weakness on 0/2585 complicated by seizures, diabetes, HTN, HLD, HFrEF, PAD with associated ischemia, history of lower GI bleed, recent admission 6/23 with dry gangrene of right great toe and underwent elective surgery per Dr. Sherryle Lis of podiatry, return to ED with slight worsening of the leg despite completed 2 weeks of cefadroxil.  Patient noted to have worsening gangrenous changes to the right forefoot. Patient was admitted for further work-up  Assessment & Plan    Gangrene right foot with history of PAD, recent elective right great toe amputation -Vascular surgery and Dr. Sharol Given consulted -Patient was placed on vancomycin and cefepime -Underwent right below-knee amputation today on 2/77   Acute metabolic encephalopathy with intermittent confusion In the setting of recent CVA in 03/2021 -Still very confused, likely delirium, worsened with sedatives, pain medications - Ammonia level 11 on 7/24, B12, folate in normal limits -Chest x-ray on 7/24 did not show any infiltrates -Patient has a known history of CVA in 03/2021, had 1 episode of seizure.  Will obtain MRI of the brain, rule out any new CVA.  (equivocal neuro exam as patient still confused and not following commands. - cefepime also has known side effect of encephalopathy, will discontinue cefepime, discussed with patient's husband, Dr. [name] who is in agreement -Discontinued oral Dilaudid, Norco 7.5, decreased morphine low-dose to every 4 hours as needed, continue Norco 5/325, will  further adjust doses in a.m.  ESRD on hemodialysis, MWF -HD per nephrology  Prior history of basal ganglia CVA in 03/2021 with residual left-sided weakness History of seizures -Currently no seizure activity, continue Keppra 500 mg at bedtime -Continue aspirin, Crestor    Diabetes mellitus type 2, IDDM with complications of ESRD, CVA Recent Labs    05/17/22 2005 05/18/22 0454 05/18/22 0801 05/18/22 1008 05/18/22 1104 05/18/22 1642  GLUCAP 188* 181* 173* 125* 129* 189*   -Continue Lantus 4 units nightly, SSI  Anemia, prior history of GI bleed -H&H currently stable and improving, no bleeding episodes  Obesity Estimated body mass index is 31 kg/m as calculated from the following:   Height as of this encounter: '5\' 5"'$  (1.651 m).   Weight as of this encounter: 84.5 kg.  Code Status: Full CODE STATUS DVT Prophylaxis:  SCD's Start: 05/18/22 1213 heparin injection 5,000 Units Start: 05/13/22 1415 Place TED hose Start: 05/10/22 1813   Level of Care: Level of care: Med-Surg Family Communication: Updated patient's husband, Dr. [name] on phone   Disposition Plan:      Remains inpatient appropriate: Underwent right BKA today, still delirious   Procedures:  Right BKA  Consultants:   Nephrology Orthopedics Vascular surgery  Antimicrobials:   Anti-infectives (From admission, onward)    Start     Dose/Rate Route Frequency Ordered Stop   05/18/22 1300  ceFAZolin (ANCEF) IVPB 2g/100 mL premix  Status:  Discontinued        2 g 200 mL/hr over 30 Minutes Intravenous Every 8 hours 05/18/22 1212 05/18/22 1233   05/14/22 1130  vancomycin (VANCOCIN) IVPB 1000 mg/200 mL premix        1,000 mg 200 mL/hr over 60 Minutes Intravenous  Once 05/14/22 1038 05/14/22 1429   05/11/22 1900  ceFEPIme (MAXIPIME) 1 g in sodium chloride 0.9 % 100 mL IVPB        1 g 200 mL/hr over 30 Minutes Intravenous Every 24 hours 05/10/22 1715     05/11/22 1200  vancomycin (VANCOCIN) IVPB 1000 mg/200 mL  premix        1,000 mg 200 mL/hr over 60 Minutes Intravenous Every M-W-F (Hemodialysis) 05/10/22 1715     05/10/22 1730  vancomycin (VANCOREADY) IVPB 1750 mg/350 mL        1,750 mg 175 mL/hr over 120 Minutes Intravenous  Once 05/10/22 1715 05/10/22 2151   05/10/22 1730  ceFEPIme (MAXIPIME) 2 g in sodium chloride 0.9 % 100 mL IVPB        2 g 200 mL/hr over 30 Minutes Intravenous  Once 05/10/22 1715 05/10/22 1840          Medications  (feeding supplement) PROSource Plus  30 mL Oral BID BM   acetaminophen  1,000 mg Oral Q8H   [START ON 05/19/2022] vitamin C  1,000 mg Oral Daily   aspirin  81 mg Oral Daily   carvedilol  3.125 mg Oral BID WC   Chlorhexidine Gluconate Cloth  6 each Topical Q0600   darbepoetin (ARANESP) injection - DIALYSIS  200 mcg Intravenous Q Fri-HD   [START ON 05/19/2022] docusate sodium  100 mg Oral Daily   feeding supplement (NEPRO CARB STEADY)  237 mL Oral BID BM   fentaNYL       heparin injection (subcutaneous)  5,000 Units Subcutaneous Q8H   insulin aspart  0-6 Units Subcutaneous TID WC   insulin glargine-yfgn  4 Units Subcutaneous QHS   levETIRAcetam  500 mg Oral QHS   losartan  100 mg Oral Daily   methylphenidate  5 mg Oral Daily   midazolam       mometasone-formoterol  2 puff Inhalation BID   nutrition supplement (JUVEN)  1 packet Oral BID BM   pantoprazole  40 mg Oral Daily   rosuvastatin  20 mg Oral Daily   sevelamer carbonate  0.8 g Oral TID WC   [START ON 05/19/2022] zinc sulfate  220 mg Oral Daily      Subjective:   Carolyn Russo was seen and examined today.  Still very confused, repeating words.  Not oriented to time place or person, difficult to obtain review of system from the patient.  Currently no nausea vomiting abdominal pain or diarrhea.  No fevers  Objective:   Vitals:   05/18/22 1120 05/18/22 1135 05/18/22 1150 05/18/22 1209  BP: (!) 143/65 (!) 159/68 (!) 156/64 (!) 134/110  Pulse: 72 73 73 75  Resp: '17 12 17 16  '$ Temp:   97.9 F  (36.6 C) 98 F (36.7 C)  TempSrc:    Oral  SpO2: 95% 99% 100% 100%  Weight:      Height:        Intake/Output  Summary (Last 24 hours) at 05/18/2022 1624 Last data filed at 05/18/2022 1056 Gross per 24 hour  Intake 650 ml  Output 100 ml  Net 550 ml     Wt Readings from Last 3 Encounters:  05/18/22 84.5 kg  04/15/22 81.7 kg  03/30/22 93.9 kg     Exam General: Alert and awake, confused Cardiovascular: S1 S2 auscultated,  RRR Respiratory: Clear to auscultation bilaterally Gastrointestinal: Soft, nontender, nondistended, + bowel sounds Ext: right BKA Neuro: able to raise both arms but not following commands Skin: No rashes Psych: confused    Data Reviewed:  I have personally reviewed following labs    CBC Lab Results  Component Value Date   WBC 16.4 (H) 05/18/2022   RBC 3.29 (L) 05/18/2022   HGB 9.2 (L) 05/18/2022   HCT 27.9 (L) 05/18/2022   MCV 86.6 05/18/2022   MCH 28.6 05/18/2022   PLT 570 (H) 05/18/2022   MCHC 33.0 05/18/2022   RDW 19.6 (H) 05/18/2022   LYMPHSABS 3.4 05/17/2022   MONOABS 2.2 (H) 05/17/2022   EOSABS 0.4 05/17/2022   BASOSABS 0.1 25/36/6440     Last metabolic panel Lab Results  Component Value Date   NA 135 05/18/2022   K 4.0 05/18/2022   CL 98 05/18/2022   CO2 21 (L) 05/18/2022   BUN 42 (H) 05/18/2022   CREATININE 7.38 (H) 05/18/2022   GLUCOSE 187 (H) 05/18/2022   GFRNONAA 5 (L) 05/18/2022   GFRAA 17 06/19/2020   CALCIUM 9.8 05/18/2022   PHOS 5.3 (H) 05/18/2022   PROT 7.2 05/15/2022   ALBUMIN 2.5 (L) 05/18/2022   LABGLOB 3.2 04/13/2022   AGRATIO 1.2 04/13/2022   BILITOT 0.5 05/15/2022   ALKPHOS 50 05/15/2022   AST 20 05/15/2022   ALT 22 05/15/2022   ANIONGAP 16 (H) 05/18/2022    CBG (last 3)  Recent Labs    05/18/22 0801 05/18/22 1008 05/18/22 1104  GLUCAP 173* 125* 129*        Kinaya Hilliker M.D. [facility] 05/18/2022, 4:24 PM  Available via Epic secure chat 7am-7pm After 7 pm, please refer to night  coverage provider listed on amion.

## 2022-05-18 NOTE — Anesthesia Preprocedure Evaluation (Addendum)
Anesthesia Evaluation  Patient identified by MRN, date of birth, ID band Patient awake    Reviewed: Allergy & Precautions, NPO status , Patient's Chart, lab work & pertinent test results, reviewed documented beta blocker date and time   History of Anesthesia Complications Negative for: history of anesthetic complications  Airway Mallampati: II  TM Distance: >3 FB Neck ROM: Full    Dental   Pulmonary asthma ,    Pulmonary exam normal        Cardiovascular hypertension, Pt. on medications and Pt. on home beta blockers + CAD, + Peripheral Vascular Disease and +CHF  Normal cardiovascular exam     Neuro/Psych CVA (2022)    GI/Hepatic Neg liver ROS, GERD  ,  Endo/Other  diabetes, Type 2, Insulin Dependent  Renal/GU ESRF and DialysisRenal disease  negative genitourinary   Musculoskeletal negative musculoskeletal ROS (+)   Abdominal   Peds  Hematology  (+) Blood dyscrasia, anemia , Hgb 9.2   Anesthesia Other Findings   Reproductive/Obstetrics                           Anesthesia Physical Anesthesia Plan  ASA: 3  Anesthesia Plan: MAC and Regional   Post-op Pain Management: Regional block* and Tylenol PO (pre-op)*   Induction: Intravenous  PONV Risk Score and Plan: 2 and Propofol infusion, TIVA and Treatment may vary due to age or medical condition  Airway Management Planned: Natural Airway, Nasal Cannula and Simple Face Mask  Additional Equipment: None  Intra-op Plan:   Post-operative Plan:   Informed Consent: I have reviewed the patients History and Physical, chart, labs and discussed the procedure including the risks, benefits and alternatives for the proposed anesthesia with the patient or authorized representative who has indicated his/her understanding and acceptance.       Plan Discussed with:   Anesthesia Plan Comments:         Anesthesia Quick Evaluation

## 2022-05-18 NOTE — Progress Notes (Signed)
Orthopedic Tech Progress Note Patient Details:  ANMARIE FUKUSHIMA 26-Jul-1951 [id number]  Called in order to [facility] for an [place] [facility]    Patient ID: MELISSIA LAHMAN, female   DOB: 16-Feb-1951, 71 y.o.   MRN: [id number]  Janit Pagan 05/18/2022, 11:17 AM

## 2022-05-18 NOTE — Anesthesia Procedure Notes (Signed)
Procedure Name: MAC Date/Time: 05/18/2022 10:22 AM  Performed by: Barrington Ellison, CRNAPre-anesthesia Checklist: Patient identified, Emergency Drugs available, Suction available, Patient being monitored and Timeout performed Patient Re-evaluated:Patient Re-evaluated prior to induction Oxygen Delivery Method: Simple face mask

## 2022-05-19 ENCOUNTER — Inpatient Hospital Stay (HOSPITAL_COMMUNITY): Payer: Medicare PPO

## 2022-05-19 ENCOUNTER — Encounter (HOSPITAL_COMMUNITY): Payer: Self-pay | Admitting: Orthopedic Surgery

## 2022-05-19 DIAGNOSIS — N184 Chronic kidney disease, stage 4 (severe): Secondary | ICD-10-CM | POA: Diagnosis not present

## 2022-05-19 DIAGNOSIS — L089 Local infection of the skin and subcutaneous tissue, unspecified: Secondary | ICD-10-CM | POA: Diagnosis not present

## 2022-05-19 DIAGNOSIS — I6381 Other cerebral infarction due to occlusion or stenosis of small artery: Secondary | ICD-10-CM | POA: Diagnosis not present

## 2022-05-19 DIAGNOSIS — R569 Unspecified convulsions: Secondary | ICD-10-CM | POA: Diagnosis not present

## 2022-05-19 DIAGNOSIS — R4182 Altered mental status, unspecified: Secondary | ICD-10-CM | POA: Diagnosis not present

## 2022-05-19 DIAGNOSIS — J4520 Mild intermittent asthma, uncomplicated: Secondary | ICD-10-CM | POA: Diagnosis not present

## 2022-05-19 LAB — CBC
HCT: 25.8 % — ABNORMAL LOW (ref 36.0–46.0)
Hemoglobin: 8.4 g/dL — ABNORMAL LOW (ref 12.0–15.0)
MCH: 28 pg (ref 26.0–34.0)
MCHC: 32.6 g/dL (ref 30.0–36.0)
MCV: 86 fL (ref 80.0–100.0)
Platelets: 601 10*3/uL — ABNORMAL HIGH (ref 150–400)
RBC: 3 MIL/uL — ABNORMAL LOW (ref 3.87–5.11)
RDW: 19.3 % — ABNORMAL HIGH (ref 11.5–15.5)
WBC: 18.8 10*3/uL — ABNORMAL HIGH (ref 4.0–10.5)
nRBC: 0 % (ref 0.0–0.2)

## 2022-05-19 LAB — RENAL FUNCTION PANEL
Albumin: 2.4 g/dL — ABNORMAL LOW (ref 3.5–5.0)
Anion gap: 13 (ref 5–15)
BUN: 50 mg/dL — ABNORMAL HIGH (ref 8–23)
CO2: 23 mmol/L (ref 22–32)
Calcium: 9.6 mg/dL (ref 8.9–10.3)
Chloride: 99 mmol/L (ref 98–111)
Creatinine, Ser: 9.15 mg/dL — ABNORMAL HIGH (ref 0.44–1.00)
GFR, Estimated: 4 mL/min — ABNORMAL LOW (ref >60)
Glucose, Bld: 201 mg/dL — ABNORMAL HIGH (ref 70–99)
Phosphorus: 5.9 mg/dL — ABNORMAL HIGH (ref 2.5–4.6)
Potassium: 4.3 mmol/L (ref 3.5–5.1)
Sodium: 135 mmol/L (ref 135–145)

## 2022-05-19 LAB — GLUCOSE, CAPILLARY
Glucose-Capillary: 189 mg/dL — ABNORMAL HIGH (ref 70–99)
Glucose-Capillary: 169 mg/dL — ABNORMAL HIGH (ref 70–99)
Glucose-Capillary: 178 mg/dL — ABNORMAL HIGH (ref 70–99)

## 2022-05-19 LAB — SURGICAL PATHOLOGY

## 2022-05-19 MED ORDER — HYDROCODONE-ACETAMINOPHEN 5-325 MG PO TABS: 1.0000 | ORAL_TABLET | Freq: Four times a day (QID) | ORAL | Status: DC | PRN

## 2022-05-19 MED ORDER — HEPARIN SODIUM (PORCINE) 1000 UNIT/ML DIALYSIS: 1000.0000 [IU] | INTRAMUSCULAR | Status: DC | PRN

## 2022-05-19 MED ORDER — ACETAMINOPHEN 500 MG PO TABS
ORAL_TABLET | ORAL | Status: AC
  Filled 2022-05-19: qty 2

## 2022-05-19 MED ORDER — ALTEPLASE 2 MG IJ SOLR
2.0000 mg | Freq: Once | INTRAMUSCULAR | Status: AC | PRN
Start: 2022-05-19 — End: 2022-05-19
  Administered 2022-05-19: 2 mg
  Filled 2022-05-19: qty 2

## 2022-05-19 MED ORDER — HEPARIN SODIUM (PORCINE) 1000 UNIT/ML IJ SOLN: 4000.0000 [IU] | Freq: Once | INTRAMUSCULAR | Status: AC

## 2022-05-19 MED ORDER — HEPARIN SODIUM (PORCINE) 1000 UNIT/ML IJ SOLN
4000.0000 [IU] | Freq: Once | INTRAMUSCULAR | Status: AC
  Administered 2022-05-19: 4000 [IU] via INTRAVENOUS
  Filled 2022-05-19: qty 4

## 2022-05-19 MED ORDER — HEPARIN SODIUM (PORCINE) 1000 UNIT/ML IJ SOLN
INTRAMUSCULAR | Status: AC
  Administered 2022-05-19: 4000 [IU] via INTRAVENOUS
  Filled 2022-05-19: qty 4

## 2022-05-19 MED ORDER — QUETIAPINE FUMARATE 25 MG PO TABS
12.5000 mg | ORAL_TABLET | Freq: Every day | ORAL | Status: DC
  Administered 2022-05-19: 12.5 mg via ORAL
  Filled 2022-05-19: qty 1

## 2022-05-19 MED ORDER — ANTICOAGULANT SODIUM CITRATE 4% (200MG/5ML) IV SOLN
5.0000 mL | Status: DC | PRN
Start: 2022-05-19 — End: 2022-05-19

## 2022-05-19 NOTE — Consult Note (Addendum)
NEUROLOGY CONSULTATION NOTE   Date of service: May 19, 2022 Patient Name: Carolyn Russo MRN:  [id number] DOB:  September 17, 1951 Reason for consult: altered mental status _ _ _   _ __   _ __ _ _  __ __   _ __   __ _  History of Present Illness  Carolyn Russo is a 71 y.o. female who has a past medical history of Acute GI bleeding, Allergy, Anemia, Anterior chest wall pain, Appendicitis (1965), Asthma, Body mass index 37.0-37.9, adult, Breast pain, Cataract, CHF (congestive heart failure) ([place]), Chronic kidney disease, Cognitive change (17/79/3903), Complication of anesthesia, Dehydration (2014), Deviated septum (1971), Diabetes mellitus, Dysphagia due to old stroke, Dyspnea (2014), Extrinsic asthma, Fibroid (1980), GERD (gastroesophageal reflux disease), Heart murmur, History of migraine, History of seizure, gestational diabetes, Hyperlipidemia, Hypertension (2014), Inguinal hernia (1959), Malaise and fatigue (2014), Non-IgE mediated allergic asthma (2014), Obesity, Pelvic pain, Pregnancy, high-risk (1985), Stroke ([place]) (04/20/2021), Tonsillitis (1968), Uterine fibroid (1980), and Visual field defect. who presented recent admission 6/23 with dry gangrene of right great toe and underwent elective surgery per Dr. Sherryle Lis of podiatry, return to ED with slight worsening of the leg despite completed 2 weeks of cefadroxil. Patient noted to have worsening gangrenous changes to the right forefoot.Underwent right below-knee amputation yesterday on 7/26 with Dr. Sharol Given. Now with persistent encephalopathy for which neurology has been consulted.   Her neurologic history is significant for Strokes x 2 First stroke was a subcortical infarct on the right 2017 d/t SVD (Dr. Erlinda Hong). She presented with left sided weakness for 3 days on 04/16/2021. Personally reviewed hospitalization pertinent progress notes, lab work and imaging.  LDL 70. A1c 5.2. MRA head/neck unremarkable. EF 50% on 02/2021. on aspirin PTA thus  DAPT for 3 weeks  then Plavix alone was recommended.  Second stroke 2022 w/right basal ganglia infarct also due to small vessel disease initially neurology recommended aspirin/Plavix on discharge. However patient developed GI bleeding with acute blood loss anemia requiring 2 units of PRBC transfusion. She was sent to Rehab where she developed left sided weakness and AMS and was found to have EEG with evidence of potential epileptogenicity arising from right central parietal region, cortical dysfunction arising from right hemisphere for which Keppra was started. She was ultimately placed on plavix monotherapy for stroke prevention.  She is [facility] at [facility]. Has continued on Keppra and Plavix monotherapy. She appears to have improved over time and was able to use a walker to ambulate. A&Ox3 with mild left hemibody weakness.    ROS  Unable to obtain ROS d/t altered mental status.   Past History   Past Medical History:  Diagnosis Date   Acute GI bleeding    Allergy    Anemia    Anterior chest wall pain    Appendicitis 1965   Asthma    Body mass index 37.0-37.9, adult    Breast pain    Cataract    both eyes   CHF (congestive heart failure) ([place])    Chronic kidney disease    stage 5 - on dialysis   Cognitive change 04/20/2021   r/t cva 0/0923   Complication of anesthesia    Memory loss after general   Dehydration 2014   Deviated septum 1971   Diabetes mellitus    Dysphagia due to old stroke    easy to get strangled when eating   Dyspnea 2014   Extrinsic asthma    [place]  GERD (gastroesophageal reflux disease)    Heart murmur    History of migraine    History of seizure    with stroke   Hx gestational diabetes    Hyperlipidemia    Hypertension 2014   Inguinal hernia 1959   Malaise and fatigue 2014   Non-IgE mediated allergic asthma 2014   Obesity    Pelvic pain    Pregnancy, high-risk 1985   Stroke ([place]) 04/20/2021   (CVA) of right basal ganglia   Tonsillitis  1968   Uterine fibroid 1980   Visual field defect    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   Past Surgical History:  Procedure Laterality Date   ABDOMINAL AORTOGRAM W/LOWER EXTREMITY N/A 02/21/2022   Procedure: ABDOMINAL AORTOGRAM W/LOWER EXTREMITY;  Surgeon: Waynetta Sandy, MD;  Location: [facility];  Service: Cardiovascular;  Laterality: N/A;   AMPUTATION Right 05/18/2022   Procedure: RIGHT BELOW KNEE AMPUTATION;  Surgeon: Newt Minion, MD;  Location: [facility];  Service: Orthopedics;  Laterality: Right;   AMPUTATION TOE Right 04/15/2022   Procedure: AMPUTATION  LST TOERIGHT FOOT;  Surgeon: Criselda Peaches, DPM;  Location: [facility];  Service: Podiatry;  Laterality: Right;   APPENDECTOMY  9924   BASCILIC VEIN TRANSPOSITION Right 04/30/2021   Procedure: RIGHT FIRST STAGE [place];  Surgeon: Angelia Mould, MD;  Location: [facility];  Service: Vascular;  Laterality: Right;   CESAREAN SECTION  1985   COLONOSCOPY     ESOPHAGOGASTRODUODENOSCOPY (EGD) WITH PROPOFOL N/A 04/18/2021   Procedure: ESOPHAGOGASTRODUODENOSCOPY (EGD) WITH PROPOFOL;  Surgeon: Gatha Mayer, MD;  Location: [facility];  Service: Endoscopy;  Laterality: N/A;   EYE SURGERY     bilateral cataract    FLEXIBLE SIGMOIDOSCOPY N/A 04/18/2021   Procedure: FLEXIBLE SIGMOIDOSCOPY;  Surgeon: Gatha Mayer, MD;  Location[facility];  Service: Endoscopy;  Laterality: N/A;   HERNIA REPAIR  1959   IR FLUORO GUIDE CV LINE RIGHT  03/18/2021   IR US GUIDE VASC ACCESS RIGHT  03/18/2021   LEFT HEART CATH AND CORONARY ANGIOGRAPHY N/A 04/04/2017   Procedure: Left Heart Cath and Coronary Angiography;  Surgeon: Belva Crome, MD;  Location: [place] [facility];  Service: Cardiovascular;  Laterality: N/A;   [place], 2004, 2007   PERIPHERAL VASCULAR THROMBECTOMY  02/21/2022   Procedure: PERIPHERAL VASCULAR THROMBECTOMY;  Surgeon: Waynetta Sandy, MD;  Location: [facility];  Service: Cardiovascular;;   Right Tibial   RHINOPLASTY  1971   ROTATOR CUFF REPAIR  2003   SURGICAL REPAIR OF HEMORRHAGE  2015   TONSILLECTOMY  1968   Family History  Problem Relation Age of Onset   Cancer Mother        abdominal melamona   Psoriasis Mother    Alzheimer's disease Father    Cancer Cousin        colon    Diabetes Cousin    Diabetes Maternal Aunt    Colon cancer Neg Hx    Colon polyps Neg Hx    Esophageal cancer Neg Hx    Rectal cancer Neg Hx    Stomach cancer Neg Hx    Breast cancer Neg Hx    Social History   Socioeconomic History   Marital status: Married    Spouse name: [name]   Number of children: 1   Years of education: Not on file   Highest education level: Professional school degree (e.g., MD, DDS, DVM, JD)  Occupational History  Occupation: retired  Tobacco Use   Smoking status: Never   Smokeless tobacco: Never  Vaping Use   Vaping Use: Never used  Substance and Sexual Activity   Alcohol use: No   Drug use: No   Sexual activity: Not Currently    Birth control/protection: Post-menopausal  Other Topics Concern   Not on file  Social History Narrative   06/21/21 lives with husband Dr Marylynn Pearson, caregiver- Zigmund Daniel   Patient reports she used to walk at [employer] but now due to Covid-19 she is staying inside.   Social Determinants of Health   Financial Resource Strain: Low Risk  (09/02/2021)   Overall Financial Resource Strain (CARDIA)    Difficulty of Paying Living Expenses: Not hard at all  Food Insecurity: No Food Insecurity (09/02/2021)   Hunger Vital Sign    Worried About Running Out of Food in the Last Year: Never true    Ran Out of Food in the Last Year: Never true  Transportation Needs: No Transportation Needs (09/02/2021)   PRAPARE - Hydrologist (Medical): No    Lack of Transportation (Non-Medical): No  Physical Activity: Inactive (09/02/2021)   Exercise Vital Sign    Days of Exercise per Week: 0 days    Minutes of Exercise per  Session: 0 min  Stress: Stress Concern Present (09/02/2021)   [place]    Feeling of Stress : To some extent  Social Connections: Socially Integrated (01/09/2019)   Social Connection and Isolation Panel [NHANES]    Frequency of Communication with Friends and Family: More than three times a week    Frequency of Social Gatherings with Friends and Family: More than three times a week    Attends Religious Services: More than 4 times per year    Active Member of [employer] or Organizations: Yes    Attends Music therapist: More than 4 times per year    Marital Status: Married   Allergies  Allergen Reactions   Food Anaphylaxis    Peanuts; Almonds   Wheat Bran     Upset stomach    Statins Itching and Other (See Comments)    Generalized aches- tolerates crestor    Pork-Derived Products     Does not eat pork    Shellfish Allergy Other (See Comments)    Mouth gets raw   Sitagliptin Other (See Comments)    Januiva - unknown reaction    Tetracycline Other (See Comments)    Raw mouth   Contrast Media [Iodinated Contrast Media] Rash    Happened during CT scan over 30 years ago    Medications   Medications Prior to Admission  Medication Sig Dispense Refill Last Dose   acetaminophen (TYLENOL) 500 MG tablet Take 1,000 mg by mouth 2 (two) times daily as needed (leg and arm pain).   05/10/2022   albuterol (VENTOLIN HFA) 108 (90 Base) MCG/ACT inhaler Inhale 2 puffs into the lungs every 4 (four) hours as needed for wheezing or shortness of breath. 18 g 3 05/09/2022   Alcaftadine (LASTACAFT) 0.25 % SOLN Place 1 drop into both eyes daily as needed (itching).   unknown   ARTIFICIAL TEAR SOLUTION OP Place 1 drop into both eyes daily as needed (dry eyes).   unknown   carvedilol (COREG) 3.125 MG tablet Take 1 tablet (3.125 mg total) by mouth 2 (two) times daily with a meal. 180 tablet 1 05/10/2022 at 10:00am   clopidogrel (  PLAVIX)  75 MG tablet TAKE 1 TABLET(75 MG) BY MOUTH DAILY 90 tablet 1 05/10/2022 at 10:00am   diclofenac Sodium (VOLTAREN) 1 % GEL Apply 2 g topically 4 (four) times daily. To left wrist (Patient taking differently: Apply 2 g topically daily as needed (pain). To left wrist) 150 g 0 unknown   diphenhydrAMINE (BENADRYL) 2 % cream Apply 1 application. topically daily as needed for itching.   unknown   diphenhydrAMINE (BENADRYL) 25 MG tablet Take 25 mg by mouth at bedtime.   Past Week   fluticasone (FLONASE) 50 MCG/ACT nasal spray Place 2 sprays into the nose daily as needed for allergies.    unknown   Insulin Aspart, w/Niacinamide, (FIASP) 100 UNIT/ML SOLN Inject 6-16 Units as directed 3 (three) times daily as needed (high blood sugar). Sliding scale 100-150=6 units 151-200=10 units 201-250=12 units 251-300=14 units 301-350=16 units Greater than 350 = 16 units   05/09/2022   insulin glargine (LANTUS) 100 UNIT/ML Solostar Pen Inject 13 Units into the skin daily. (Patient taking differently: Inject 12 Units into the skin at bedtime.) 15 mL 2 05/09/2022 at 7:30pm   levETIRAcetam (KEPPRA) 500 MG tablet TAKE 1 TABLET(500 MG) BY MOUTH AT BEDTIME 90 tablet 1 05/09/2022   Lidocaine 4 % GEL Apply 1 Application topically daily as needed (pain).   unknown   losartan (COZAAR) 50 MG tablet Take 50 mg by mouth daily.   05/10/2022   melatonin 5 MG TABS Take 5 mg by mouth at bedtime.   unknown   methylphenidate (RITALIN) 5 MG tablet Take 1 tablet (5 mg total) by mouth 2 (two) times daily with breakfast and lunch. Due to stroke attention (Patient taking differently: Take 5 mg by mouth daily. Due to stroke attention) 180 tablet 0 05/10/2022   mometasone-formoterol (DULERA) 200-5 MCG/ACT AERO Inhale 2 puffs into the lungs 2 (two) times daily. 3 each 2 05/09/2022   ondansetron (ZOFRAN) 8 MG tablet Take 8 mg by mouth 2 (two) times daily as needed for refractory nausea / vomiting.   unknown   ondansetron (ZOFRAN-ODT) 4 MG disintegrating  tablet Take 1 tablet (4 mg total) by mouth every 8 (eight) hours as needed for nausea or vomiting. 20 tablet 1 unknown   [EXPIRED] oxyCODONE (OXY IR/ROXICODONE) 5 MG immediate release tablet Take 1 tablet (5 mg total) by mouth every 4 (four) hours as needed for up to 7 days for severe pain. 20 tablet 0 05/09/2022   pantoprazole (PROTONIX) 40 MG tablet Take 1 tablet (40 mg total) by mouth daily. 90 tablet 2 05/10/2022   rosuvastatin (CRESTOR) 20 MG tablet Take 1 tablet (20 mg total) by mouth daily. 90 tablet 2 05/09/2022   senna-docusate (SENOKOT-S) 8.6-50 MG tablet Take 2 tablets by mouth 2 (two) times daily. (Patient taking differently: Take 1-2 tablets by mouth daily as needed for mild constipation or moderate constipation.) 360 tablet 2 unknown   sevelamer carbonate (RENVELA) 0.8 g PACK packet Take 0.8 g by mouth 3 (three) times daily with meals.   unknown   cefadroxil (DURICEF) 500 MG capsule Take 2 capsules (1,000 mg total) by mouth every Monday, Wednesday, and Friday with hemodialysis. 7 capsule 0    Continuous Blood Gluc Receiver (FREESTYLE LIBRE 2 READER) DEVI by Does not apply route.      hydrALAZINE (APRESOLINE) 10 MG tablet Take 1 tablet (10 mg total) by mouth every 8 (eight) hours as needed (Sys BP >180, hold on HD days). (Patient not taking: Reported on 05/10/2022) 90 tablet  0 Not Taking   hydrocortisone (ANUSOL-HC) 2.5 % rectal cream Place 1 application rectally 2 (two) times daily as needed for hemorrhoids or anal itching. (Patient not taking: Reported on 05/10/2022) 30 g 1 Not Taking   Insulin Pen Needle (BD PEN NEEDLE NANO 2ND GEN) 32G X 4 MM MISC See admin instructions.      iron sucrose in sodium chloride 0.9 % 100 mL Inject 100 mg into the vein as needed (based on labs).      Methoxy PEG-Epoetin Beta (MIRCERA IJ) Mircera      mupirocin ointment (BACTROBAN) 2 % Apply 1 application. topically 2 (two) times daily. (Patient not taking: Reported on 05/10/2022) 60 g 2 Not Taking   Nutritional  Supplements (,FEEDING SUPPLEMENT, PROSOURCE PLUS) liquid Take 30 mLs by mouth 2 (two) times daily between meals. (Patient not taking: Reported on 04/14/2022) 887 mL 0 Not Taking   OneTouch Delica Lancets 28N MISC Use as directed to check blood sugars before breakfast and dinner dx: e11.65 100 each 11    traMADol (ULTRAM) 50 MG tablet TAKE 1 TABLET(50 MG) BY MOUTH EVERY 12 HOURS FOR 5 DAYS (Patient not taking: Reported on 05/10/2022) 10 tablet 0 Not Taking     Vitals   Vitals:   05/19/22 1230 05/19/22 1300 05/19/22 1330 05/19/22 1400  BP: (!) 140/103 133/65 (!) 123/58 (!) 138/56  Pulse: 73 79 72 77  Resp: (!) 21 18 (!) 24 (!) 21  Temp:      TempSrc:      SpO2: 100% 100% 100% 100%  Weight:      Height:         Body mass index is 30.27 kg/m.  Physical Exam   General: Elderly female lying in bed in Hemodialysis unit, restless  HENT: Normocephalic, atraumatic CV: RRR.  Pulmonary: Symmetric Chest rise. Normal respiratory effort.  Abdomen: Soft to touch, non-tender.  Ext: RLE in post amputation immobilizer, LLE without edema  Skin: Warm and dry  Neurologic Examination  Mental status/Cognition: Alert, focuses on examiner for only a few seconds, inattentive to her environment, Does not respond to questions or exam commands  Speech/language: Perseverating speech  Cranial nerves:   CN II Pupils equal and reactive to light at 41m    CN III,IV,VI She briefly focuses on examiner on both left and right sides of her bed   CN V Unable to assess   CN VII no asymmetry, no nasolabial fold flattening in rest position   CN VIII Unable to assess    CN IX & X Unable to assess    CN XI Unable to assess    CN XII midline tongue protrusion   Motor:  Exam is limited by bulky immobilizer/wound VAC s/p R AKA and altered mental status Muscle bulk: normal, tone normal, pronator drift unable to assess  Spontaneously and purposefully moving upper extremities. RLE is in post amputation immobilizer but  there is some minimal movement. LLE no spontaneous movement but did withdraw to pain and some resistance with stiffening with my attempt to move it. Sensation:  Light touch Withdraws to noxious stimuli bilat UE and LLE. RLE not attempted.   Coordination/Complex Motor:  - unable to assess   Labs   CBC:  Recent Labs  Lab 05/16/22 0213 05/17/22 0321 05/18/22 0400 05/19/22 0305  WBC 14.7* 15.0* 16.4* 18.8*  NEUTROABS 8.3* 8.7*  --   --   HGB 7.8* 8.6* 9.2* 8.4*  HCT 23.4* 25.4* 27.9* 25.8*  MCV 85.7 83.6  86.6 86.0  PLT 477* 563* 570* 601*    Basic Metabolic Panel:  Lab Results  Component Value Date   NA 135 05/19/2022   K 4.3 05/19/2022   CO2 23 05/19/2022   GLUCOSE 201 (H) 05/19/2022   BUN 50 (H) 05/19/2022   CREATININE 9.15 (H) 05/19/2022   CALCIUM 9.6 05/19/2022   GFRNONAA 4 (L) 05/19/2022   GFRAA 17 06/19/2020   Lipid Panel:  Lab Results  Component Value Date   LDLCALC 39 04/13/2022   HgbA1c:  Lab Results  Component Value Date   HGBA1C 5.9 (H) 04/15/2022   Lab Results  Component Value Date   [id number] 829 05/18/2022    Ammonia checked on 7/24 and was wnl at 11.  Urine Drug Screen: No results found for: "LABOPIA", "COCAINSCRNUR", "LABBENZ", "AMPHETMU", "THCU", "LABBARB"  Alcohol Level No results found for: "ETH"   Impression  Carolyn Russo is a 71 y.o. AA female with extensive and complicated PMH as above and neurologic history significant for 2 CVAs involving right subcortical and right basal ganglia as above and seizure 2022 with recent admission 6/23 with dry gangrene of right great toe and underwent elective surgery per Dr. Sherryle Lis of podiatry. Patient then developed worsening progression of necrosis to the right forefoot for which amputation was recommended. Underwent right below-knee amputation yesterday on 7/26 with Dr. Sharol Given. Now with persistent encephalopathy and without focal neurologic deficit which may be concerning for seizure activity.  - CT  head: No acute intracranial abnormality. Evidence of prior infarct, atrophy and chronic microvascular ischemic change without change.  - Exam findings are most consistent with a severe diffuse encephalopathy. - Multiple factors may be contributing to altered mental status in the setting of POD#1 s/p general anesthesia, postoperative pain with opioids on board, hyper and hypoglycemia, tissue necrosis d/t gangrene, extended illness/hospitalization, constipation, and possible subclinical seizure with postictal state.    Recommendations  -Stat EEG (ordered) -Continue home Keppra 573m q 24 hours -Neuro checks per unit protocol -Seizure precautions -Minimize sedating medications and opioids as able, appropriate and tolerated.  -Continue to assess and manage metabolic and infectious issues which may be contributing. -Delirium precautions   Plan of care directed by Dr. KLeonel Ramsayand Dr. LElmyra Ricks NP-C, MSN [facility] Please see AMION for contact information for neurologic providers.    Addendum: - EEG: Abnormalities: Periodic discharges with triphasic morphology, generalized ( GPDs); Continuous slow, generalized. Impression: This study showed generalized periodic discharges with triphasic morphology  which can be on the ictal-interictal continuum. Recommend long term monitoring for further evaluation. Additionally there is moderate to severe diffuse encephalopathy, nonspecific etiology. No seizures were seen throughout the recording. - LTM EEG has been started  I have seen and examined the patient. I have reviewed the assessment and recommendations and made amendations as necessary. 71year old female presenting with AMS. Exam findings are consistent with a severe diffuse encephalopathy. Recommendations as above.  Electronically signed: Dr. EKerney Elbe

## 2022-05-19 NOTE — Progress Notes (Signed)
Received patient in bed, alert and oriented. Informed consent signed and in chart.  Time tx completed:  HD treatment completed. Patient tolerated well. HD catheter has issues running that required reversal of lines and BFR down to 250. Heparin added to tx per MD order and approval by Pharm. Helped keep alarms from continually alarming but didn't allow me to increase BFR as when I tried it would continually alarm. TPA instilled to bilateral HD cath port dwells. Patient transported back to the room, alert and orient and in no acute distress. Report given to bedside RN.  Total UF removed:   2900 ml  Medication given: heparin 4000 units bolus x2 during tx  Post HD VS:  97.4    135/45 79 21 100% RA  Post HD weight: 81.5kg

## 2022-05-19 NOTE — Plan of Care (Signed)
  Problem: Acute Rehab OT Goals (only OT should resolve) Goal: Pt. Will Perform Eating Flowsheets (Taken 05/19/2022 1054) Pt Will Perform Eating:  with min assist  with adaptive utensils  sitting Goal: Pt. Will Perform Grooming Flowsheets (Taken 05/19/2022 1054) Pt Will Perform Grooming:  with mod assist  sitting Goal: Pt. Will Perform Upper Body Bathing Flowsheets (Taken 05/19/2022 1054) Pt Will Perform Upper Body Bathing:  with min assist  sitting Goal: Pt. Will Perform Upper Body Dressing Flowsheets (Taken 05/19/2022 1054) Pt Will Perform Upper Body Dressing:  with mod assist  sitting Goal: Pt. Will Transfer To Toilet Flowsheets (Taken 05/19/2022 1054) Pt Will Transfer to Toilet:  with max assist  bedside commode Note: Lateral scoot or stand pivot Goal: Pt. Will Perform Toileting-Clothing Manipulation Flowsheets (Taken 05/19/2022 1055) Pt Will Perform Toileting - Clothing Manipulation and hygiene:  with max assist  sitting/lateral leans Goal: OT Additional ADL Goal #1 Flowsheets (Taken 05/19/2022 1055) Additional ADL Goal #1: Pt will increase bed mobility to mod assist (1 person) in order to transition to sitting on EOB in order to complete bathing/dressing tasks.

## 2022-05-19 NOTE — Evaluation (Signed)
Occupational Therapy Evaluation Patient Details Name: Carolyn Russo MRN: [id number] DOB: 1951/02/09 Today's Date: 05/19/2022   History of Present Illness Patient is a 71 year old female with ESRD on HD MWF, acute basal ganglia CVA with left-sided weakness on 06/9241 complicated by seizures, diabetes, HTN, HLD, HFrEF, PAD with associated ischemia, history of lower GI bleed, recent admission 6/23 with dry gangrene of right great toe and underwent elective surgery per Dr. Sherryle Lis of podiatry, return to ED with slight worsening of the leg despite completed 2 weeks of cefadroxil.  Patient noted to have worsening gangrenous changes to the right forefoot. Right BKA performed on 05/18/22.   Clinical Impression   Pt in bed upon therapy arrival with husband present spoon feeding oatmeal to patient. Pt presents with eyes closed and decreased level of arousal although actively opening her mouth to accept food from Husband and able to chew and swallow without difficulty demonstrated. Attempted to complete OT evaluation although due to patient's level of arousal, she was not alert enough to fully participate. Was able to transition to sitting on the EOB with 2 person assist although transitioning out of bed was not safe at this time. At the time of evaluation, patient demonstrates decreased cognition, BUE strength, activity tolerance, and endurance and requires increased physical assist from 1-2 person to complete BADL tasks. Recommend SNF at discharge to focus on mentioned deficits. OT will continue to follow patient acutely.      Recommendations for follow up therapy are one component of a multi-disciplinary discharge planning process, led by the attending physician.  Recommendations may be updated based on patient status, additional functional criteria and insurance authorization.   Follow Up Recommendations  Skilled nursing-short term rehab (<3 hours/day)    Assistance Recommended at Discharge Frequent or  constant Supervision/Assistance  Patient can return home with the following Two people to help with bathing/dressing/bathroom;Two people to help with walking and/or transfers;Assistance with feeding;Assistance with cooking/housework;Assist for transportation;Help with stairs or ramp for entrance;Direct supervision/assist for financial management;Direct supervision/assist for medications management    Functional Status Assessment  Patient has had a recent decline in their functional status and demonstrates the ability to make significant improvements in function in a reasonable and predictable amount of time.  Equipment Recommendations  Other (comment) (TBD)       Precautions / Restrictions Precautions Precautions: Fall Restrictions Weight Bearing Restrictions: Yes RLE Weight Bearing: Non weight bearing      Mobility Bed Mobility Overal bed mobility: Needs Assistance Bed Mobility: Supine to Sit, Sit to Supine     Supine to sit: HOB elevated, +2 for physical assistance, +2 for safety/equipment, Total assist Sit to supine: Total assist, +2 for physical assistance, +2 for safety/equipment        Transfers Overall transfer level:  (Not completed due to patient's level of arousel. Unsafe to attempt.)            Balance Overall balance assessment: Needs assistance Sitting-balance support: Single extremity supported, Feet supported Sitting balance-Leahy Scale: Poor Sitting balance - Comments: seated on EOB Postural control: Left lateral lean (very mild lean noted after sitting for an extended amount of time EOB.) Standing balance support:  (Not able to assess during evaluation)         ADL either performed or assessed with clinical judgement   ADL     General ADL Comments: Patient unable to participate in fully OT evaluation due to level of arousel. At this time, patient requires Total assist to complete all ADL  tasks at bed level.     Vision Baseline Vision/History: 1  Wears glasses Ability to See in Adequate Light:  (Unable to test) Patient Visual Report: Other (comment) (Unable to report) Additional Comments: Vision not tested. Pt unable to participate in vision assessment     Perception Perception Perception: Not tested   Praxis Praxis Praxis: Not tested    Pertinent Vitals/Pain Pain Assessment Pain Assessment: Faces Faces Pain Scale: No hurt     Hand Dominance Right (Per Husband, pt has been using her Left hand more due to limited use of right from dialysis.)   Extremity/Trunk Assessment Upper Extremity Assessment Upper Extremity Assessment: RUE deficits/detail;LUE deficits/detail RUE Deficits / Details: Unable to fully assess due to level of arousal. When Therapist held patient's hand to transition from supine to seated, patient did not attempt to grasp and hold on. Did not hold onto napkin when placed in her hand to wipe mouth. RUE Coordination: decreased fine motor;decreased gross motor LUE Deficits / Details: Difficult to fully assess due to level of arousal. Pt did place her left hand on bed while seated on EOB for support. Gross weak grasp noted when holding onto bed rail briefly. LUE Coordination: decreased fine motor;decreased gross motor   Lower Extremity Assessment Lower Extremity Assessment: Defer to PT evaluation       Communication Communication Communication: HOH   Cognition Arousal/Alertness: Lethargic Behavior During Therapy: Flat affect Overall Cognitive Status: Difficult to assess Area of Impairment: Orientation, Attention, Following commands, Awareness       Orientation Level: Disoriented to, Place, Time, Situation     Following Commands:  (Did not follow 1 step commands)       General Comments: Pt kept eyes closed for eval while opening them briefly 2-3 times when asked. Unable to answer yes/no questions consistantly. Would open her mouth when asked if she wanted more ice chips versus verbalizing yes/no.                 Home Living Family/patient expects to be discharged to:: Skilled nursing facility       Additional Comments: Husband works as Chief Executive [employer]. He assists patient when at home. Has aide come in daily to assist with housekeeping tasks and ADL tasks.      Prior Functioning/Environment Prior Level of Function : Needs assist       Physical Assist : Mobility (physical);ADLs (physical) Mobility (physical): Gait ADLs (physical): Grooming;Bathing;Dressing;Toileting;IADLs Mobility Comments: Per chart: Patient utilized w/c and self propelled using BLEs. RW was used for short distances. ADLs Comments: Assist provided by Husband and aide.        OT Problem List: Decreased strength;Decreased coordination;Decreased cognition;Decreased range of motion;Decreased activity tolerance;Decreased safety awareness;Decreased knowledge of use of DME or AE;Impaired balance (sitting and/or standing);Decreased knowledge of precautions;Impaired UE functional use      OT Treatment/Interventions: Self-care/ADL training;Therapeutic exercise;Therapeutic activities;Splinting;Neuromuscular education;Cognitive remediation/compensation;Energy conservation;DME and/or AE instruction;Patient/family education;Visual/perceptual remediation/compensation;Balance training;Manual therapy;Modalities    OT Goals(Current goals can be found in the care plan section) Acute Rehab OT Goals Patient Stated Goal: None stated OT Goal Formulation: Patient unable to participate in goal setting Time For Goal Achievement: 06/02/22 Potential to Achieve Goals: Fair  OT Frequency: Min 2X/week    Co-evaluation PT/OT/SLP Co-Evaluation/Treatment: Yes Reason for Co-Treatment: Necessary to address cognition/behavior during functional activity;To address functional/ADL transfers   OT goals addressed during session: Strengthening/ROM;ADL's and self-care      AM-PAC OT "6 Clicks" Daily Activity     Outcome Measure Help from  another  person eating meals?: Total Help from another person taking care of personal grooming?: Total Help from another person toileting, which includes using toliet, bedpan, or urinal?: Total Help from another person bathing (including washing, rinsing, drying)?: Total Help from another person to put on and taking off regular upper body clothing?: Total Help from another person to put on and taking off regular lower body clothing?: Total 6 Click Score: 6   End of Session    Activity Tolerance: Patient limited by lethargy Patient left: in bed;with call bell/phone within reach;with bed alarm set  OT Visit Diagnosis: Unsteadiness on feet (R26.81);Muscle weakness (generalized) (M62.81)                Time: 5277-8242 OT Time Calculation (min): 21 min Charges:  OT General Charges $OT Visit: 1 Visit OT Evaluation $OT Eval High Complexity: 1 High  [employer], OTR/L,CBIS  Supplemental OT - [facility] and [facility]   Christyan Reger, Clarene Duke 05/19/2022, 10:49 AM

## 2022-05-19 NOTE — Progress Notes (Addendum)
[facility] Progress Note   Subjective: Seen on HD. Still confused, not responding meaningfully to verbal stimuli.     Objective Vitals:   05/19/22 0833 05/19/22 0834 05/19/22 0930 05/19/22 1000  BP: 114/72  118/78 115/63  Pulse: 78  93 84  Resp: '20  20 20  '$ Temp:      TempSrc:      SpO2: 100% 97% 97% 98%  Weight:      Height:       Physical Exam General: Chronically ill appearing female  Neuro: Not answering questions, making "eh,eh, eh' sounds.  Heart: S1,S2 no M/R/G. SR on monitor HR 90s Lungs: CTAB anteriorly Abdomen: NABS, NT Extremities: R BKA in stump protector. Trace LLE edema.  Dialysis Access: [facility] drsg CDI   Additional Objective Labs: Basic Metabolic Panel: Recent Labs  Lab 05/17/22 0604 05/18/22 0402 05/19/22 0305  NA 136 135 135  K 4.1 4.0 4.3  CL 99 98 99  CO2 25 21* 23  GLUCOSE 117* 187* 201*  BUN 25* 42* 50*  CREATININE 6.08* 7.38* 9.15*  CALCIUM 9.6 9.8 9.6  PHOS 4.4 5.3* 5.9*   Liver Function Tests: Recent Labs  Lab 05/15/22 0928 05/16/22 0213 05/17/22 0604 05/18/22 0402 05/19/22 0305  AST 20  --   --   --   --   ALT 22  --   --   --   --   ALKPHOS 50  --   --   --   --   BILITOT 0.5  --   --   --   --   PROT 7.2  --   --   --   --   ALBUMIN 2.4*   < > 2.4* 2.5* 2.4*   < > = values in this interval not displayed.   No results for input(s): "LIPASE", "AMYLASE" in the last 168 hours. CBC: Recent Labs  Lab 05/15/22 0928 05/16/22 0213 05/17/22 0321 05/18/22 0400 05/19/22 0305  WBC 13.1* 14.7* 15.0* 16.4* 18.8*  NEUTROABS 7.8* 8.3* 8.7*  --   --   HGB 8.2* 7.8* 8.6* 9.2* 8.4*  HCT 24.2* 23.4* 25.4* 27.9* 25.8*  MCV 84.3 85.7 83.6 86.6 86.0  PLT 463* 477* 563* 570* 601*   Blood Culture    Component Value Date/Time   SDES BLOOD BLOOD LEFT HAND 05/16/2022 1327   SPECREQUEST  05/16/2022 1327    BOTTLES DRAWN AEROBIC AND ANAEROBIC Blood Culture results may not be optimal due to an inadequate volume of blood  received in culture bottles   CULT  05/16/2022 1327    NO GROWTH 3 DAYS Performed at [facility], [place] [address], [place], [place] [address]    REPTSTATUS PENDING 05/16/2022 1327    Cardiac Enzymes: No results for input(s): "CKTOTAL", "CKMB", "CKMBINDEX", "TROPONINI" in the last 168 hours. CBG: Recent Labs  Lab 05/18/22 1008 05/18/22 1104 05/18/22 1642 05/18/22 1959 05/19/22 0751  GLUCAP 125* 129* 189* 175* 189*   Iron Studies:  Recent Labs    05/18/22 0400  IRON 44  TIBC 153*  FERRITIN 1,215*   '@lablastinr3'$ @ Studies/Results: CT HEAD WO CONTRAST (5MM)  Result Date: 05/19/2022 CLINICAL DATA:  Mental status changes of unknown cause, history of prior CVA in a 71 year old female EXAM: CT HEAD WITHOUT CONTRAST TECHNIQUE: Contiguous axial images were obtained from the base of the skull through the vertex without intravenous contrast. RADIATION DOSE REDUCTION: This exam was performed according to the departmental dose-optimization program which includes automated  exposure control, adjustment of the mA and/or kV according to patient size and/or use of iterative reconstruction technique. COMPARISON:  MRI of the brain from May 06, 2021, CT of the head from May 05, 2021 FINDINGS: Brain: No evidence of acute infarction, hemorrhage, hydrocephalus, extra-axial collection or mass lesion/mass effect. Atrophy and chronic microvascular ischemic change with similar appearance to prior imaging. Signs of prior infarct in the RIGHT basal ganglia and corona radiata. Vascular: No hyperdense vessel or unexpected calcification. Skull: Normal. Negative for fracture or focal lesion. Sinuses/Orbits: Visualized paranasal sinuses and orbits without acute process. Other: None IMPRESSION: 1. No acute intracranial abnormality. 2. Evidence of prior infarct, atrophy and chronic microvascular ischemic change without change. Electronically Signed   By: Zetta Bills M.D.   On: 05/19/2022 10:30   Medications:   sodium chloride     anticoagulant sodium citrate     magnesium sulfate bolus IVPB      (feeding supplement) PROSource Plus  30 mL Oral BID BM   acetaminophen       acetaminophen  1,000 mg Oral Q8H   vitamin C  1,000 mg Oral Daily   aspirin  81 mg Oral Daily   carvedilol  3.125 mg Oral BID WC   Chlorhexidine Gluconate Cloth  6 each Topical Q0600   darbepoetin (ARANESP) injection - DIALYSIS  200 mcg Intravenous Q Fri-HD   docusate sodium  100 mg Oral Daily   feeding supplement (NEPRO CARB STEADY)  237 mL Oral BID BM   heparin injection (subcutaneous)  5,000 Units Subcutaneous Q8H   insulin aspart  0-6 Units Subcutaneous TID WC   insulin glargine-yfgn  4 Units Subcutaneous QHS   levETIRAcetam  500 mg Oral QHS   losartan  100 mg Oral Daily   methylphenidate  5 mg Oral Daily   mometasone-formoterol  2 puff Inhalation BID   nutrition supplement (JUVEN)  1 packet Oral BID BM   pantoprazole  40 mg Oral Daily   QUEtiapine  12.5 mg Oral QHS   rosuvastatin  20 mg Oral Daily   sevelamer carbonate  0.8 g Oral TID WC   zinc sulfate  220 mg Oral Daily     Dialysis Orders: MWF - [facility] 4 hrs 180NRE 350/700 88.5 kg 2.0 K/2.0 Ca UFP 4  -No heparin -Mircera 200 mcg  IV q 2wks - last 04/29/22 -Venofer '100mg'$  IV X 5 doses-new order, hasn't been given yet -Hectorol 51mg IV TIW   Assessment/Plan: 1. Gangrene R Foot. VVS consulted. S/P R BKA 05/18/2022 per Dr. DSharol Given Per primary.  2. ESRD -MWF.HD today off schedule. Next HD 05/20/2022 to resume MWF schedule.  3. Anemia - HGB 8.4. Expect drop in HGB post op. ABX stopped. Start Fe load. Continue weekly ESA.  4. Secondary hyperparathyroidism - Continue binders, Corrected Ca+ 10.4. Decrease VDRA dose.  5. HTN/volume - Hypertensive this AM. Continue home meds, losartan increased 05/18/2022. Will need lower EDW on discharge.  6. Nutrition - Very low albumin. Add protein supplements.  7. DMT2-per rpimary 8. H/O CVA-ASA, plavix,  Keppra  Rita H. Brown NP-C 05/19/2022, 12:06 PM  [facility] [phone] Pt seen, examined and agree w A/P as above.  RKelly Splinter MD 05/19/2022, 3:52 PM

## 2022-05-19 NOTE — Progress Notes (Signed)
Patient ID: Carolyn Russo, female   DOB: 1951-02-07, 71 y.o.   MRN: [id number] Patient is postoperative day 1 transtibial amputation.  There is a good suction fit on the wound VAC canister.  And anticipate discharge to skilled nursing.  No change in patient's affect or mood.

## 2022-05-19 NOTE — Procedures (Addendum)
Patient Name: Carolyn Russo  MRN: [id number]  Epilepsy Attending: [name]  Referring Physician/Provider: Hetty Blend, N Date: 05/19/2022 Duration: 23.04 mins  Patient history: 71yo F with h/o seizures now with ams. EEG to evaluate for seizure.  Level of alertness:  lethargic   AEDs during EEG study: LEV  Technical aspects: This EEG study was done with scalp electrodes positioned according to the 10-20 International system of electrode placement. Electrical activity was reviewed with band pass filter of 1-'70Hz'$ , sensitivity of 7 uV/mm, display speed of 63m/sec with a '60Hz'$  notched filter applied as appropriate. EEG data were recorded continuously and digitally stored.  Video monitoring was available and reviewed as appropriate.  Description: EEG showed continuous generalized 3 to 6 Hz theta-delta slowing. Generalized periodic discharges with triphasic morphology at  1.5-2.'5Hz'$  were noted intermittently. Hyperventilation and photic stimulation were not performed.     ABNORMALITY - Periodic discharges with triphasic morphology, generalized ( GPDs) - Continuous slow, generalized  IMPRESSION: This study showed generalized periodic discharges with triphasic morphology  which can be on the ictal-interictal continuum. Recommend long term monitoring for further evaluation.  Additionally there is moderate to severe diffuse encephalopathy, nonspecific etiology. No seizures were seen throughout the recording.  Suhana Wilner OBarbra Sarks

## 2022-05-19 NOTE — Progress Notes (Signed)
[facility]                                                                              Carolyn Russo, is a 71 y.o. female, DOB - 1951/09/16, TOI:[id number] Admit date - 05/10/2022    Outpatient Primary MD for the patient is [name], MD  LOS - 9  days  Chief Complaint  Patient presents with   Cellulitis       Brief summary   Patient is a 71 year old female with ESRD on HD MWF, acute basal ganglia CVA with left-sided weakness on 05/3381 complicated by seizures, diabetes, HTN, HLD, HFrEF, PAD with associated ischemia, history of lower GI bleed, recent admission 6/23 with dry gangrene of right great toe and underwent elective surgery per Dr. Sherryle Lis of podiatry, return to ED with slight worsening of the leg despite completed 2 weeks of cefadroxil.  Patient noted to have worsening gangrenous changes to the right forefoot. Patient was admitted for further work-up  Assessment & Plan    Gangrene right foot with history of PAD, recent elective right great toe amputation -Vascular surgery and Dr. Sharol Given consulted -Underwent right below-knee amputation on 7/26, postop day #1 -Patient was placed on vancomycin and cefepime, discontinued cefepime on 7/26, DC'd vancomycin on 7/27 (discussed with Dr. Sharol Given, no further need of antibiotics)  Acute metabolic encephalopathy with intermittent confusion In the setting of recent CVA in 03/2021 -At baseline, alert oriented x3, per husband at the bedside mental status was " sharp" before hospitalization. - Ammonia level 11 on 7/24, B12, folate in normal limits -Chest x-ray on 7/24 did not show any infiltrates -Patient has a known history of CVA in 03/2021, had 1 episode of seizure.   - cefepime also has known side effect of encephalopathy, cefepime was discontinued on 7/26  -Opioids were decreased however patient has not required that many doses.  -MRI brain discontinued, difficult as patient has wound VAC, will obtain CT  head -Somnolent today, not following any commands, overnight did not sleep.  Will place on low-dose Seroquel 12.5 mg nightly. -Neurology consulted, Dr. Leonel Ramsay, recommended EEG  Leukocytosis -WBCs trending up, unclear etiology, no respiratory symptoms, chest x-ray on 7/24 with no infiltrates.  No nausea vomiting or diarrhea. -No infectious signs, obtain UA and culture to rule out UTI -Follow CT head  ESRD on hemodialysis, MWF -HD per nephrology  Prior history of basal ganglia CVA in 03/2021 with residual left-sided weakness History of seizures -Currently no seizure activity, continue Keppra 500 mg at bedtime -Continue aspirin, Crestor    Diabetes mellitus type 2, IDDM with complications of ESRD, CVA Recent Labs    05/18/22 0801 05/18/22 1008 05/18/22 1104 05/18/22 1642 05/18/22 1959 05/19/22 0751  GLUCAP 173* 125* 129* 189* 175* 189*   -Continue low-dose Lantus 4 units qhs, SSI  Anemia, prior history of GI bleed -H&H currently stable and improving, no bleeding episodes  Obesity Estimated body mass index is 30.27 kg/m as calculated from the following:   Height as of this encounter: '5\' 5"'$  (1.651 m).   Weight as of this encounter: 82.5 kg.  Code Status: Full CODE STATUS DVT Prophylaxis:  SCD's Start: 05/18/22 1213 heparin injection 5,000 Units Start: 05/13/22 1415 Place TED hose Start: 05/10/22 1813   Level of Care: Level of care: Med-Surg Family Communication: Updated patient's husband, Dr. [name] at the bedside   Disposition Plan:      Remains inpatient appropriate:    Procedures:  Right BKA  Consultants:   Nephrology Orthopedics Vascular surgery  Antimicrobials:   Anti-infectives (From admission, onward)    Start     Dose/Rate Route Frequency Ordered Stop   05/18/22 1300  ceFAZolin (ANCEF) IVPB 2g/100 mL premix  Status:  Discontinued        2 g 200 mL/hr over 30 Minutes Intravenous Every 8 hours 05/18/22 1212 05/18/22 1233   05/14/22 1130   vancomycin (VANCOCIN) IVPB 1000 mg/200 mL premix        1,000 mg 200 mL/hr over 60 Minutes Intravenous  Once 05/14/22 1038 05/14/22 1429   05/11/22 1900  ceFEPIme (MAXIPIME) 1 g in sodium chloride 0.9 % 100 mL IVPB  Status:  Discontinued        1 g 200 mL/hr over 30 Minutes Intravenous Every 24 hours 05/10/22 1715 05/18/22 1633   05/11/22 1200  vancomycin (VANCOCIN) IVPB 1000 mg/200 mL premix  Status:  Discontinued        1,000 mg 200 mL/hr over 60 Minutes Intravenous Every M-W-F (Hemodialysis) 05/10/22 1715 05/19/22 0925   05/10/22 1730  vancomycin (VANCOREADY) IVPB 1750 mg/350 mL        1,750 mg 175 mL/hr over 120 Minutes Intravenous  Once 05/10/22 1715 05/10/22 2151   05/10/22 1730  ceFEPIme (MAXIPIME) 2 g in sodium chloride 0.9 % 100 mL IVPB        2 g 200 mL/hr over 30 Minutes Intravenous  Once 05/10/22 1715 05/10/22 1840          Medications  (feeding supplement) PROSource Plus  30 mL Oral BID BM   acetaminophen       acetaminophen  1,000 mg Oral Q8H   vitamin C  1,000 mg Oral Daily   aspirin  81 mg Oral Daily   carvedilol  3.125 mg Oral BID WC   Chlorhexidine Gluconate Cloth  6 each Topical Q0600   darbepoetin (ARANESP) injection - DIALYSIS  200 mcg Intravenous Q Fri-HD   docusate sodium  100 mg Oral Daily   feeding supplement (NEPRO CARB STEADY)  237 mL Oral BID BM   heparin injection (subcutaneous)  5,000 Units Subcutaneous Q8H   heparin sodium (porcine)  4,000 Units Intravenous Once   insulin aspart  0-6 Units Subcutaneous TID WC   insulin glargine-yfgn  4 Units Subcutaneous QHS   levETIRAcetam  500 mg Oral QHS   losartan  100 mg Oral Daily   methylphenidate  5 mg Oral Daily   mometasone-formoterol  2 puff Inhalation BID   nutrition supplement (JUVEN)  1 packet Oral BID BM   pantoprazole  40 mg Oral Daily   QUEtiapine  12.5 mg Oral QHS   rosuvastatin  20 mg Oral Daily   sevelamer carbonate  0.8 g Oral TID WC   zinc sulfate  220 mg Oral Daily       Subjective:   Carolyn Russo was seen and examined today.  Confused and somnolent, did not sleep well last night.  Not following commands.  No fevers or chills, nausea vomiting or diarrhea.  Objective:   Vitals:   05/19/22 1300 05/19/22 1330 05/19/22 1400 05/19/22 1430  BP: 133/65 (!) 123/58 (!) 138/56 128/82  Pulse: 79 72 77 81  Resp: 18 (!) 24 (!) 21 17  Temp:      TempSrc:      SpO2: 100% 100% 100% 100%  Weight:      Height:        Intake/Output Summary (Last 24 hours) at 05/19/2022 1451 Last data filed at 05/19/2022 1100 Gross per 24 hour  Intake 120 ml  Output --  Net 120 ml     Wt Readings from Last 3 Encounters:  05/19/22 82.5 kg  04/15/22 81.7 kg  03/30/22 93.9 kg   Physical Exam General: Somnolent, still confused, not following commands. Cardiovascular: S1 S2 clear, RRR. Respiratory: CTAB, no wheezing Gastrointestinal: Soft, nontender, nondistended, NBS Ext: right BKA Neuro: difficult to examine, not following commands Skin: No rashes Psych: somnolent  Data Reviewed:  I have personally reviewed following labs    CBC Lab Results  Component Value Date   WBC 18.8 (H) 05/19/2022   RBC 3.00 (L) 05/19/2022   HGB 8.4 (L) 05/19/2022   HCT 25.8 (L) 05/19/2022   MCV 86.0 05/19/2022   MCH 28.0 05/19/2022   PLT 601 (H) 05/19/2022   MCHC 32.6 05/19/2022   RDW 19.3 (H) 05/19/2022   LYMPHSABS 3.4 05/17/2022   MONOABS 2.2 (H) 05/17/2022   EOSABS 0.4 05/17/2022   BASOSABS 0.1 57/26/2035     Last metabolic panel Lab Results  Component Value Date   NA 135 05/19/2022   K 4.3 05/19/2022   CL 99 05/19/2022   CO2 23 05/19/2022   BUN 50 (H) 05/19/2022   CREATININE 9.15 (H) 05/19/2022   GLUCOSE 201 (H) 05/19/2022   GFRNONAA 4 (L) 05/19/2022   GFRAA 17 06/19/2020   CALCIUM 9.6 05/19/2022   PHOS 5.9 (H) 05/19/2022   PROT 7.2 05/15/2022   ALBUMIN 2.4 (L) 05/19/2022   LABGLOB 3.2 04/13/2022   AGRATIO 1.2 04/13/2022   BILITOT 0.5 05/15/2022    ALKPHOS 50 05/15/2022   AST 20 05/15/2022   ALT 22 05/15/2022   ANIONGAP 13 05/19/2022    CBG (last 3)  Recent Labs    05/18/22 1642 05/18/22 1959 05/19/22 0751  GLUCAP 189* 175* 189*        Carolyn Russo M.D. [facility] 05/19/2022, 2:51 PM  Available via Epic secure chat 7am-7pm After 7 pm, please refer to night coverage provider listed on amion.

## 2022-05-19 NOTE — Progress Notes (Signed)
Inpatient Rehab Admissions Coordinator: C  Consult received and chart reviewed.  Pt total assist +2 with decreased arousal for participation.  Therapy recommending SNF at this time.  I will sign off.  If pt participation changes please feel free to let me know.   Shann Medal, PT, DPT Admissions Coordinator [phone] 05/19/22  12:22 PM

## 2022-05-19 NOTE — Progress Notes (Signed)
Stat  EEG complete - results pending.  

## 2022-05-19 NOTE — Evaluation (Signed)
Physical Therapy Evaluation Patient Details Name: Carolyn Russo MRN: [id number] DOB: April 03, 1951 Today's Date: 05/19/2022  History of Present Illness  71 y/o female presented to ED on 05/10/22 for R foot infection after 1st ray amputation x 1 month ago. S/p R BKA on 7/26. PMH: diabetes, hypertension, hyperlipidemia, ESRD on HD MWF, hx of CVA 03/2021  Clinical Impression  Patient admitted with the above. PTA, patient lives with husband and receives assistance from him and hired caregivers. Evaluation limited by lethargy and patient keeping eyes closed majority of session except when asked to open eyes. Patient requiring totalA+2 for bed mobility this date with poor sitting balance. Deferred attempt to transfer patient as she was unable to maintain an appropriate level of alertness to participate. Patient will benefit from skilled PT services during acute stay to address listed deficits. Recommend SNF at discharge to maximize functional mobility and decrease burden of care.      Recommendations for follow up therapy are one component of a multi-disciplinary discharge planning process, led by the attending physician.  Recommendations may be updated based on patient status, additional functional criteria and insurance authorization.  Follow Up Recommendations Skilled nursing-short term rehab (<3 hours/day) Can patient physically be transported by private vehicle: No    Assistance Recommended at Discharge Frequent or constant Supervision/Assistance  Patient can return home with the following  Two people to help with walking and/or transfers;Two people to help with bathing/dressing/bathroom;Assistance with feeding;Direct supervision/assist for medications management;Direct supervision/assist for financial management;Assist for transportation    [facility] bed (hoyer lift)  Recommendations for Other Services       Functional Status Assessment Patient has had a recent decline  in their functional status and/or demonstrates limited ability to make significant improvements in function in a reasonable and predictable amount of time     Precautions / Restrictions Precautions Precautions: Fall Precaution Comments: limb protector Restrictions Weight Bearing Restrictions: Yes RLE Weight Bearing: Non weight bearing      Mobility  Bed Mobility Overal bed mobility: Needs Assistance Bed Mobility: Supine to Sit, Sit to Supine     Supine to sit: Total assist, +2 for physical assistance, +2 for safety/equipment, HOB elevated Sit to supine: Total assist, +2 for physical assistance, +2 for safety/equipment   General bed mobility comments: no initiation made by patient. TotalA+2 for all aspects of bed mobility    Transfers                   General transfer comment: deferred due to lethargy    Ambulation/Gait                  Stairs            Wheelchair Mobility    Modified Rankin (Stroke Patients Only)       Balance Overall balance assessment: Needs assistance Sitting-balance support: Single extremity supported, Feet supported Sitting balance-Leahy Scale: Poor Sitting balance - Comments: seated on EOB Postural control: Left lateral lean (very mild lean noted after sitting for an extended amount of time EOB.)                                   Pertinent Vitals/Pain Pain Assessment Pain Assessment: Faces Faces Pain Scale: No hurt Pain Intervention(s): Monitored during session    Home Living Family/patient expects to be discharged to:: Skilled nursing facility  Additional Comments: Husband works as Chief Executive [employer]. He assists patient when at home. Has aide come in daily to assist with housekeeping tasks and ADL tasks.    Prior Function Prior Level of Function : Needs assist       Physical Assist : Mobility (physical);ADLs (physical) Mobility (physical): Gait ADLs (physical):  Grooming;Bathing;Dressing;Toileting;IADLs Mobility Comments: Per chart: Patient utilized w/c and self propelled using BLEs. RW was used for short distances. ADLs Comments: Assist provided by Husband and aide.     Hand Dominance   Dominant Hand: Right (Per Husband, pt has been using her Left hand more due to limited use of right from dialysis)    Extremity/Trunk Assessment   Upper Extremity Assessment Upper Extremity Assessment: Defer to OT evaluation RUE Deficits / Details: Unable to fully assess due to level of arousal. When Therapist held patient's hand to transition from supine to seated, patient did not attempt to grasp and hold on. Did not hold onto napkin when placed in her hand to wipe mouth. RUE Coordination: decreased fine motor;decreased gross motor LUE Deficits / Details: Difficult to fully assess due to level of arousal. Pt did place her left hand on bed while seated on EOB for support. Gross weak grasp noted when holding onto bed rail briefly. LUE Coordination: decreased fine motor;decreased gross motor    Lower Extremity Assessment Lower Extremity Assessment: RLE deficits/detail;Difficult to assess due to impaired cognition RLE Deficits / Details: s/p R BKA - unable to assess due to impaired cognition and lethargy during evaluation    Cervical / Trunk Assessment Cervical / Trunk Assessment: Kyphotic  Communication   Communication: HOH  Cognition Arousal/Alertness: Lethargic Behavior During Therapy: Flat affect Overall Cognitive Status: Difficult to assess Area of Impairment: Orientation, Attention, Following commands, Awareness                 Orientation Level: Disoriented to, Place, Time, Situation Current Attention Level: Focused   Following Commands: Follows one step commands inconsistently   Awareness: Intellectual   General Comments: Pt kept eyes closed for eval while opening them briefly 2-3 times when asked. Unable to answer yes/no questions  consistently. Would open her mouth when asked if she wanted more ice chips versus verbalizing yes/no.        General Comments      Exercises     Assessment/Plan    PT Assessment Patient needs continued PT services  PT Problem List Decreased strength;Decreased activity tolerance;Decreased balance;Decreased mobility;Decreased cognition;Decreased knowledge of use of DME;Decreased safety awareness;Decreased knowledge of precautions;Impaired sensation;Obesity       PT Treatment Interventions Therapeutic exercise;Balance training;Therapeutic activities;Functional mobility training;DME instruction;Patient/family education;Wheelchair mobility training    PT Goals (Current goals can be found in the Care Plan section)  Acute Rehab PT Goals Patient Stated Goal: did not state PT Goal Formulation: Patient unable to participate in goal setting Time For Goal Achievement: 06/02/22 Potential to Achieve Goals: Fair    Frequency Min 2X/week     Co-evaluation PT/OT/SLP Co-Evaluation/Treatment: Yes Reason for Co-Treatment: To address functional/ADL transfers;Necessary to address cognition/behavior during functional activity;For patient/therapist safety PT goals addressed during session: Mobility/safety with mobility OT goals addressed during session: Strengthening/ROM;ADL's and self-care       AM-PAC PT "6 Clicks" Mobility  Outcome Measure Help needed turning from your back to your side while in a flat bed without using bedrails?: Total Help needed moving from lying on your back to sitting on the side of a flat bed without using bedrails?: Total Help needed moving  to and from a bed to a chair (including a wheelchair)?: Total Help needed standing up from a chair using your arms (e.g., wheelchair or bedside chair)?: Total Help needed to walk in hospital room?: Total Help needed climbing 3-5 steps with a railing? : Total 6 Click Score: 6    End of Session   Activity Tolerance: Patient  limited by lethargy Patient left: in bed;with bed alarm set;with call bell/phone within reach Nurse Communication: Mobility status PT Visit Diagnosis: Muscle weakness (generalized) (M62.81);Other abnormalities of gait and mobility (R26.89)    Time: 1443-1540 PT Time Calculation (min) (ACUTE ONLY): 21 min   Charges:   PT Evaluation $PT Eval Moderate Complexity: 1 Mod          Dyani Babel A. Gilford Rile PT, DPT Acute Rehabilitation Services Office [phone]   Linna Hoff 05/19/2022, 11:41 AM

## 2022-05-20 DIAGNOSIS — R4182 Altered mental status, unspecified: Secondary | ICD-10-CM | POA: Diagnosis not present

## 2022-05-20 DIAGNOSIS — I6381 Other cerebral infarction due to occlusion or stenosis of small artery: Secondary | ICD-10-CM | POA: Diagnosis not present

## 2022-05-20 DIAGNOSIS — L089 Local infection of the skin and subcutaneous tissue, unspecified: Secondary | ICD-10-CM | POA: Diagnosis not present

## 2022-05-20 DIAGNOSIS — N184 Chronic kidney disease, stage 4 (severe): Secondary | ICD-10-CM | POA: Diagnosis not present

## 2022-05-20 DIAGNOSIS — J4520 Mild intermittent asthma, uncomplicated: Secondary | ICD-10-CM | POA: Diagnosis not present

## 2022-05-20 LAB — URINALYSIS, ROUTINE W REFLEX MICROSCOPIC
Bilirubin Urine: NEGATIVE
Glucose, UA: >=500 mg/dL — AB
Hgb urine dipstick: NEGATIVE
Ketones, ur: NEGATIVE mg/dL
Leukocytes,Ua: NEGATIVE
Nitrite: NEGATIVE
Protein, ur: 100 mg/dL — AB
Specific Gravity, Urine: 1.013 (ref 1.005–1.030)
pH: 7 (ref 5.0–8.0)

## 2022-05-20 LAB — RENAL FUNCTION PANEL
Albumin: 2.5 g/dL — ABNORMAL LOW (ref 3.5–5.0)
Anion gap: 15 (ref 5–15)
BUN: 26 mg/dL — ABNORMAL HIGH (ref 8–23)
CO2: 24 mmol/L (ref 22–32)
Calcium: 9.5 mg/dL (ref 8.9–10.3)
Chloride: 94 mmol/L — ABNORMAL LOW (ref 98–111)
Creatinine, Ser: 5.58 mg/dL — ABNORMAL HIGH (ref 0.44–1.00)
GFR, Estimated: 8 mL/min — ABNORMAL LOW (ref >60)
Glucose, Bld: 190 mg/dL — ABNORMAL HIGH (ref 70–99)
Phosphorus: 4.4 mg/dL (ref 2.5–4.6)
Potassium: 3.9 mmol/L (ref 3.5–5.1)
Sodium: 133 mmol/L — ABNORMAL LOW (ref 135–145)

## 2022-05-20 LAB — CBC
HCT: 25.8 % — ABNORMAL LOW (ref 36.0–46.0)
Hemoglobin: 8.6 g/dL — ABNORMAL LOW (ref 12.0–15.0)
MCH: 27.9 pg (ref 26.0–34.0)
MCHC: 33.3 g/dL (ref 30.0–36.0)
MCV: 83.8 fL (ref 80.0–100.0)
Platelets: 592 10*3/uL — ABNORMAL HIGH (ref 150–400)
RBC: 3.08 MIL/uL — ABNORMAL LOW (ref 3.87–5.11)
RDW: 19.8 % — ABNORMAL HIGH (ref 11.5–15.5)
WBC: 21.5 10*3/uL — ABNORMAL HIGH (ref 4.0–10.5)
nRBC: 0.1 % (ref 0.0–0.2)

## 2022-05-20 LAB — GLUCOSE, CAPILLARY
Glucose-Capillary: 192 mg/dL — ABNORMAL HIGH (ref 70–99)
Glucose-Capillary: 245 mg/dL — ABNORMAL HIGH (ref 70–99)
Glucose-Capillary: 275 mg/dL — ABNORMAL HIGH (ref 70–99)
Glucose-Capillary: 286 mg/dL — ABNORMAL HIGH (ref 70–99)

## 2022-05-20 LAB — PROCALCITONIN: Procalcitonin: 1.47 ng/mL

## 2022-05-20 MED ORDER — WITCH HAZEL-GLYCERIN EX PADS
MEDICATED_PAD | CUTANEOUS | Status: DC | PRN
Start: 2022-05-20 — End: 2022-06-10
  Administered 2022-06-07: 1 via TOPICAL
  Filled 2022-05-20: qty 100

## 2022-05-20 MED ORDER — HEPARIN SODIUM (PORCINE) 1000 UNIT/ML IJ SOLN
INTRAMUSCULAR | Status: AC
  Filled 2022-05-20: qty 4

## 2022-05-20 MED ORDER — CLOPIDOGREL BISULFATE 75 MG PO TABS
75.0000 mg | ORAL_TABLET | Freq: Every day | ORAL | Status: DC
  Administered 2022-05-20 – 2022-06-05 (×17): 75 mg via ORAL
  Filled 2022-05-20 (×17): qty 1

## 2022-05-20 MED ORDER — SODIUM CHLORIDE 0.9 % IV SOLN
125.0000 mg | INTRAVENOUS | Status: DC
  Administered 2022-05-20 – 2022-05-27 (×4): 125 mg via INTRAVENOUS
  Filled 2022-05-20 (×7): qty 10

## 2022-05-20 NOTE — Progress Notes (Signed)
Pt. Arrived to unit drowsy able to arousal, response to voice

## 2022-05-20 NOTE — TOC Progression Note (Signed)
Transition of Care [facility]) - Progression Note    Patient Details  Name: AALAYAH RILES MRN: [id number] Date of Birth: Mar 20, 1951  Transition of Care [facility]) CM/SW Contact  Joanne Chars, LCSW Phone Number: 05/20/2022, 10:23 AM  Clinical Narrative:   CSW attempted to reach son, no answer unable to leave message.  CSW spoke with pt husband, [name], discussed PT recommendation for SNF.  He asked about CIR, discussed that CIR has reviewed chart and pt not appropriate at this time.  Husband hesitant to commit to plan as feels pt may improve, discussed that plan can be adjusted as needed moving forward.  Husband OK with starting SNF referral, choice document placed in room.  Pt is vaccinated for covid with at least one booster.  Referral sent out for SNF    Expected Discharge Plan: [place] (vs CIR vs SNF) Barriers to Discharge: Continued Medical Work up  Expected Discharge Plan and Services Expected Discharge Plan: [place] (vs CIR vs SNF)   Discharge Planning Services: CM Consult                               HH Arranged: RN, PT, Speech Therapy (PTA active with [facility]) [place]: [facility] [place] Determinants of Health (SDOH) Interventions    Readmission Risk Interventions     No data to display

## 2022-05-20 NOTE — Progress Notes (Signed)
[facility] Progress Note   Subjective:    Seen and examined patient on HD. Tolerating UFG 2L. BP stable. Unable to respond to questions. Only says: "yes" when name called.   Objective Vitals:   05/20/22 1003 05/20/22 1032 05/20/22 1106 05/20/22 1131  BP: (!) 154/47 (!) 111/94 140/62 (!) 133/42  Pulse:  81 84 82  Resp:  (!) 22 (!) 22 18  Temp:      TempSrc:      SpO2:  99% 100% 100%  Weight:      Height:       Physical Exam General: Chronically ill-appearing; on RA; NAD Neuro: Not answering questions; says "yes" to voice Heart: RRR; No M/R/G; S1 and S2 Lungs: Clear anteriorly Abdomen: Soft and non-tender Extremities: R BKA-stump protector in place; trace edema LLE Dialysis Access: R[name]   Filed Weights   05/19/22 1630 05/20/22 0925 05/20/22 0941  Weight: 81.5 kg 80.7 kg 80.7 kg    Intake/Output Summary (Last 24 hours) at 05/20/2022 1143 Last data filed at 05/19/2022 1618 Gross per 24 hour  Intake --  Output 2900 ml  Net -2900 ml    Additional Objective Labs: Basic Metabolic Panel: Recent Labs  Lab 05/18/22 0402 05/19/22 0305 05/20/22 0341  NA 135 135 133*  K 4.0 4.3 3.9  CL 98 99 94*  CO2 21* 23 24  GLUCOSE 187* 201* 190*  BUN 42* 50* 26*  CREATININE 7.38* 9.15* 5.58*  CALCIUM 9.8 9.6 9.5  PHOS 5.3* 5.9* 4.4   Liver Function Tests: Recent Labs  Lab 05/15/22 0928 05/16/22 0213 05/18/22 0402 05/19/22 0305 05/20/22 0341  AST 20  --   --   --   --   ALT 22  --   --   --   --   ALKPHOS 50  --   --   --   --   BILITOT 0.5  --   --   --   --   PROT 7.2  --   --   --   --   ALBUMIN 2.4*   < > 2.5* 2.4* 2.5*   < > = values in this interval not displayed.   No results for input(s): "LIPASE", "AMYLASE" in the last 168 hours. CBC: Recent Labs  Lab 05/15/22 0928 05/16/22 0213 05/17/22 0321 05/18/22 0400 05/19/22 0305 05/20/22 0341  WBC 13.1* 14.7* 15.0* 16.4* 18.8* 21.5*  NEUTROABS 7.8* 8.3* 8.7*  --   --   --   HGB 8.2* 7.8*  8.6* 9.2* 8.4* 8.6*  HCT 24.2* 23.4* 25.4* 27.9* 25.8* 25.8*  MCV 84.3 85.7 83.6 86.6 86.0 83.8  PLT 463* 477* 563* 570* 601* 592*   Blood Culture    Component Value Date/Time   SDES BLOOD BLOOD LEFT HAND 05/16/2022 1327   SPECREQUEST  05/16/2022 1327    BOTTLES DRAWN AEROBIC AND ANAEROBIC Blood Culture results may not be optimal due to an inadequate volume of blood received in culture bottles   CULT  05/16/2022 1327    NO GROWTH 4 DAYS Performed at [facility], [place] [address], [place], [place] [address]    REPTSTATUS PENDING 05/16/2022 1327    Cardiac Enzymes: No results for input(s): "CKTOTAL", "CKMB", "CKMBINDEX", "TROPONINI" in the last 168 hours. CBG: Recent Labs  Lab 05/18/22 1959 05/19/22 0751 05/19/22 1721 05/19/22 2117 05/20/22 0759  GLUCAP 175* 189* 169* 178* 192*   Iron Studies:  Recent Labs    05/18/22 0400  IRON 44  TIBC 153*  FERRITIN 1,215*   Lab Results  Component Value Date   INR 1.2 05/10/2022   INR 1.1 04/13/2022   INR 1.0 04/14/2021   Studies/Results: Overnight EEG with video  Result Date: 05/20/2022 [name], MD     05/20/2022  8:54 AM Patient Name: Carolyn Russo MRN: [id number] Epilepsy Attending: [name] Referring Physician/Provider: [name], MD Duration: 05/19/2022 2050 to 05/20/2022 7829  Patient history: 71yo F with h/o seizures now with ams. EEG to evaluate for seizure.  Level of alertness:  lethargic  AEDs during EEG study: LEV  Technical aspects: This EEG study was done with scalp electrodes positioned according to the 10-20 International system of electrode placement. Electrical activity was reviewed with band pass filter of 1-'70Hz'$ , sensitivity of 7 uV/mm, display speed of 70m/sec with a '60Hz'$  notched filter applied as appropriate. EEG data were recorded continuously and digitally stored.  Video monitoring was available and reviewed as appropriate.  Description: EEG showed continuous generalized 3 to 6 Hz  theta-delta slowing. Generalized periodic discharges with triphasic morphology at  1.5-2.'5Hz'$  were noted intermittently, more prominent when awake/stimulated. Hyperventilation and photic stimulation were not performed.    ABNORMALITY - Periodic discharges with triphasic morphology, generalized ( GPDs) - Continuous slow, generalized  IMPRESSION: This study showed generalized periodic discharges with triphasic morphology which can be on the ictal-interictal continuum.  However, the morphology, frequency and reactivity to stimulation is more likely indicative of toxic-metabolic causes. Additionally there is moderate to severe diffuse encephalopathy, nonspecific etiology. No seizures were seen throughout the recording.  PLora Havens  EEG adult  Result Date: 05/19/2022 YLora Havens MD     05/20/2022  8:19 AM Patient Name: Carolyn LIVASMRN: [id number] Attending: PLora HavensReferring Physician/Provider: BHetty Blend N Date: 05/19/2022 Duration: 23.04 mins Patient history: 89yoF with h/o seizures now with ams. EEG to evaluate for seizure. Level of alertness:  lethargic AEDs during EEG study: LEV Technical aspects: This EEG study was done with scalp electrodes positioned according to the 10-20 International system of electrode placement. Electrical activity was reviewed with band pass filter of 1-'70Hz'$ , sensitivity of 7 uV/mm, display speed of 347msec with a '60Hz'$  notched filter applied as appropriate. EEG data were recorded continuously and digitally stored.  Video monitoring was available and reviewed as appropriate. Description: EEG showed continuous generalized 3 to 6 Hz theta-delta slowing. Generalized periodic discharges with triphasic morphology at  1.5-2.'5Hz'$  were noted intermittently. Hyperventilation and photic stimulation were not performed.   ABNORMALITY - Periodic discharges with triphasic morphology, generalized ( GPDs) - Continuous slow, generalized IMPRESSION: This study  showed generalized periodic discharges with triphasic morphology  which can be on the ictal-interictal continuum. Recommend long term monitoring for further evaluation. Additionally there is moderate to severe diffuse encephalopathy, nonspecific etiology. No seizures were seen throughout the recording. Priyanka O Barbra Sarks CT HEAD WO CONTRAST (5MM)  Result Date: 05/19/2022 CLINICAL DATA:  Mental status changes of unknown cause, history of prior CVA in a [age]ear old female EXAM: CT HEAD WITHOUT CONTRAST TECHNIQUE: Contiguous axial images were obtained from the base of the skull through the vertex without intravenous contrast. RADIATION DOSE REDUCTION: This exam was performed according to the departmental dose-optimization program which includes automated exposure control, adjustment of the mA and/or kV according to patient size and/or use of iterative reconstruction technique. COMPARISON:  MRI of the brain from May 06, 2021, CT of the head from May 05, 2021 FINDINGS: Brain: No evidence of acute infarction, hemorrhage, hydrocephalus, extra-axial collection or mass lesion/mass effect. Atrophy and chronic microvascular ischemic change with similar appearance to prior imaging. Signs of prior infarct in the RIGHT basal ganglia and corona radiata. Vascular: No hyperdense vessel or unexpected calcification. Skull: Normal. Negative for fracture or focal lesion. Sinuses/Orbits: Visualized paranasal sinuses and orbits without acute process. Other: None IMPRESSION: 1. No acute intracranial abnormality. 2. Evidence of prior infarct, atrophy and chronic microvascular ischemic change without change. Electronically Signed   By: Zetta Bills M.D.   On: 05/19/2022 10:30    Medications:  sodium chloride     magnesium sulfate bolus IVPB      (feeding supplement) PROSource Plus  30 mL Oral BID BM   acetaminophen  1,000 mg Oral Q8H   vitamin C  1,000 mg Oral Daily   aspirin  81 mg Oral Daily   carvedilol  3.125 mg Oral  BID WC   Chlorhexidine Gluconate Cloth  6 each Topical Q0600   darbepoetin (ARANESP) injection - DIALYSIS  200 mcg Intravenous Q Fri-HD   docusate sodium  100 mg Oral Daily   feeding supplement (NEPRO CARB STEADY)  237 mL Oral BID BM   heparin injection (subcutaneous)  5,000 Units Subcutaneous Q8H   insulin aspart  0-6 Units Subcutaneous TID WC   insulin glargine-yfgn  4 Units Subcutaneous QHS   levETIRAcetam  500 mg Oral QHS   losartan  100 mg Oral Daily   methylphenidate  5 mg Oral Daily   mometasone-formoterol  2 puff Inhalation BID   nutrition supplement (JUVEN)  1 packet Oral BID BM   pantoprazole  40 mg Oral Daily   QUEtiapine  12.5 mg Oral QHS   rosuvastatin  20 mg Oral Daily   sevelamer carbonate  0.8 g Oral TID WC   zinc sulfate  220 mg Oral Daily    Dialysis Orders: MWF - [place] 4 hrs 180NRE 350/700 88.5 kg 2.0 K/2.0 Ca UFP 4  -No heparin -Mircera 200 mcg  IV q 2wks - last 04/29/22 -Venofer '100mg'$  IV X 5 doses-new order, hasn't been given yet -Hectorol 69mg IV TIW  Assessment/Plan: 1. Gangrene R Foot. VVS consulted. S/P R BKA 05/18/2022 per Dr. DSharol Given Per primary.  2. ESRD -MWF. On HD today to resume MWF schedule.  3. Anemia - HGB 8.6. Expect drop in HGB post op. ABX stopped. Fe load started today Continue weekly ESA.  4. Secondary hyperparathyroidism - Continue binders, Corrected Ca+ 10.4. Decrease VDRA dose.  5. HTN/volume - Hypertensive this AM. Continue home meds, losartan increased 05/18/2022. Will need lower EDW on discharge.  6. Nutrition - Very low albumin. Add protein supplements.  7. DMT2-per rpimary 8. H/O CVA-ASA, plavix, Keppra  CTobie Poet NP CPyote7/28/2023,11:43 AM  LOS: 10 days

## 2022-05-20 NOTE — Progress Notes (Signed)
PT Cancellation Note  Patient Details Name: Carolyn Russo MRN: [id number] DOB: 1951-09-19   Cancelled Treatment:    Reason Eval/Treat Not Completed: Patient at procedure or test/unavailable (HD). PT will continue to f/u with pt acutely as available and appropriate.    Clearnce Sorrel Nicolai Labonte 05/20/2022, 11:09 AM

## 2022-05-20 NOTE — Progress Notes (Signed)
LTM EEG discontinued - no skin breakdown at unhook.   

## 2022-05-20 NOTE — NC FL2 (Signed)
[place] MEDICAID FL2 LEVEL OF CARE SCREENING TOOL     IDENTIFICATION  Patient Name: Carolyn Russo Birthdate: Aug 16, 1951 Sex: female Admission Date (Current Location): 05/10/2022  [facility] and [place] Number:  Herbalist and Address:  The . [facility], [place] [address] [place], [place] [address]      Provider Number: [id number]  Attending Physician Name and Address:  Mendel Corning, MD  Relative Name and Phone Number:  Senaya, Dicenso. Spouse [phone]  [phone]    Current Level of Care: Hospital Recommended Level of Care: [place] Prior Approval Number:    Date Approved/Denied:   PASRR Number: [id number]  Discharge Plan: SNF    Current Diagnoses: Patient Active Problem List   Diagnosis Date Noted   Foot infection 05/10/2022   PAD (peripheral artery disease) ([employer]) 05/10/2022   Gangrene of right foot ([place]) 04/15/2022   Left hemiparesis ([place]) 09/24/2021   Abnormality of gait 09/24/2021   Pain of right hand 08/06/2021   Steal syndrome of dialysis vascular access ([place]) 08/06/2021   Subclavian steal syndrome of right subclavian artery 06/25/2021   Right hand weakness 06/25/2021   Stroke-like symptoms 05/06/2021   Bandemia    Cerebrovascular accident (CVA) of right basal ganglia ([place]) 04/20/2021   External hemorrhoids    Basal ganglia infarction ([place]) 04/16/2021   ESRD on dialysis ([place]) 04/16/2021   Hypertensive urgency 03/10/2021   Hilar enlargement    Anemia of chronic disease    Chronic diastolic CHF (congestive heart failure) ([place]) 03/09/2021   CKD (chronic kidney disease), stage V ([place]) 03/08/2021   Diabetic retinopathy ([place]) 03/08/2021   Hyperglycemia due to type 2 diabetes mellitus ([place]) 03/08/2021   Pure hypercholesterolemia 03/08/2021   Anemia in chronic kidney disease 09/24/2020   Uncontrolled type 2 diabetes mellitus with hyperglycemia ([place]) 03/19/2019   Osteopenia 09/04/2018   Migraine 09/04/2018    Asthma 09/04/2018   Pseudophakia of both eyes 07/17/2018   Pseudophakia of left eye 07/03/2018   Open angle with borderline findings and high glaucoma risk in left eye 07/03/2018   Age-related nuclear cataract of right eye 07/03/2018   CAD (coronary artery disease), native coronary artery 04/27/2017   Abnormal nuclear stress test 04/04/2017   Dyspnea on exertion 03/09/2017   Appendicitis    Age-related hypermature cataract of both eyes 12/20/2016   Uterine leiomyoma 11/27/2012   Dyslipidemia (high LDL; low HDL) 11/25/2011   Obesity (BMI 30-39.9) 11/25/2011   Hypertensive disorder 11/21/2011   Type 2 diabetes mellitus ([place]) 11/21/2011   Abnormal EKG 11/21/2011    Orientation RESPIRATION BLADDER Height & Weight     Self  Normal Incontinent, External catheter Weight: 177 lb 14.6 oz (80.7 kg) Height:  '5\' 5"'$  (165.1 cm)  BEHAVIORAL SYMPTOMS/MOOD NEUROLOGICAL BOWEL NUTRITION STATUS      Incontinent Diet (see discharge summary)  AMBULATORY STATUS COMMUNICATION OF NEEDS Skin   Total Care Verbally Surgical wounds                       Personal Care Assistance Level of Assistance  Bathing, Feeding, Dressing, Total care Bathing Assistance: Maximum assistance Feeding assistance: Limited assistance Dressing Assistance: Maximum assistance Total Care Assistance: Maximum assistance   Functional Limitations Info  Sight, Hearing, Speech Sight Info: Adequate Hearing Info: Adequate Speech Info: Adequate    SPECIAL CARE FACTORS FREQUENCY  PT (By licensed PT), OT (By licensed OT)     PT Frequency: 5x week OT Frequency: 5x  week            Contractures Contractures Info: Not present    Additional Factors Info  Code Status, Allergies Code Status Info: full Allergies Info: Food, Wheat Bran, Statins, Pork-derived Products, Shellfish Allergy, Sitagliptin, Tetracycline, Contrast Media (Iodinated Contrast Media)           Current Medications (05/20/2022):  This is the current  hospital active medication list Current Facility-Administered Medications  Medication Dose Route Frequency Provider Last Rate Last Admin   (feeding supplement) PROSource Plus liquid 30 mL  30 mL Oral BID BM Newt Minion, MD   30 mL at 05/19/22 1040   0.9 %  sodium chloride infusion   Intravenous Continuous Newt Minion, MD       acetaminophen (TYLENOL) tablet 1,000 mg  1,000 mg Oral Q8H Newt Minion, MD   1,000 mg at 05/19/22 6301   acetaminophen (TYLENOL) tablet 325-650 mg  325-650 mg Oral Q6H PRN Newt Minion, MD       albuterol (PROVENTIL) (2.5 MG/3ML) 0.083% nebulizer solution 2.5 mg  2.5 mg Nebulization Q2H PRN Newt Minion, MD       alum & mag hydroxide-simeth (MAALOX/MYLANTA) 200-200-20 MG/5ML suspension 15-30 mL  15-30 mL Oral Q2H PRN Newt Minion, MD       ascorbic acid (VITAMIN C) tablet 1,000 mg  1,000 mg Oral Daily Newt Minion, MD   1,000 mg at 05/19/22 1039   aspirin chewable tablet 81 mg  81 mg Oral Daily Newt Minion, MD   81 mg at 05/19/22 1039   bisacodyl (DULCOLAX) EC tablet 5 mg  5 mg Oral Daily PRN Newt Minion, MD       carvedilol (COREG) tablet 3.125 mg  3.125 mg Oral BID WC Newt Minion, MD   3.125 mg at 05/18/22 0801   Chlorhexidine Gluconate Cloth 2 % PADS 6 each  6 each Topical Q0600 Newt Minion, MD   6 each at 05/20/22 6010   Darbepoetin Alfa (ARANESP) injection 200 mcg  200 mcg Intravenous Q Fri-HD Newt Minion, MD   200 mcg at 05/13/22 1130   docusate sodium (COLACE) capsule 100 mg  100 mg Oral Daily Newt Minion, MD   100 mg at 05/19/22 1039   feeding supplement (NEPRO CARB STEADY) liquid 237 mL  237 mL Oral BID BM Newt Minion, MD   237 mL at 05/17/22 1450   fluticasone (FLONASE) 50 MCG/ACT nasal spray 2 spray  2 spray Each Nare Daily PRN Newt Minion, MD       guaiFENesin-dextromethorphan (ROBITUSSIN DM) 100-10 MG/5ML syrup 15 mL  15 mL Oral Q4H PRN Newt Minion, MD       heparin injection 5,000 Units  5,000 Units Subcutaneous Q8H  Newt Minion, MD   5,000 Units at 05/20/22 9323   hydrALAZINE (APRESOLINE) injection 5 mg  5 mg Intravenous Q20 Min PRN Newt Minion, MD       hydrALAZINE (APRESOLINE) tablet 10 mg  10 mg Oral Q8H PRN Newt Minion, MD       HYDROcodone-acetaminophen (NORCO/VICODIN) 5-325 MG per tablet 1 tablet  1 tablet Oral Q6H PRN Rai, Ripudeep K, MD       insulin aspart (novoLOG) injection 0-6 Units  0-6 Units Subcutaneous TID WC Newt Minion, MD   1 Units at 05/19/22 0900   insulin glargine-yfgn (SEMGLEE) injection 4 Units  4 Units Subcutaneous QHS Meridee Score  V, MD   4 Units at 05/19/22 2103   labetalol (NORMODYNE) injection 10 mg  10 mg Intravenous Q10 min PRN Newt Minion, MD       levETIRAcetam (KEPPRA) tablet 500 mg  500 mg Oral QHS Newt Minion, MD   500 mg at 05/19/22 2103   losartan (COZAAR) tablet 100 mg  100 mg Oral Daily Penninger, [place], [place]       magnesium sulfate IVPB 2 g 50 mL  2 g Intravenous Daily PRN Newt Minion, MD       methylphenidate (RITALIN) tablet 5 mg  5 mg Oral Daily Newt Minion, MD   5 mg at 05/19/22 1039   metoprolol tartrate (LOPRESSOR) injection 2-5 mg  2-5 mg Intravenous Q2H PRN Newt Minion, MD       mometasone-formoterol [facility]) 200-5 MCG/ACT inhaler 2 puff  2 puff Inhalation BID Newt Minion, MD   2 puff at 05/19/22 [phone]   morphine (PF) 2 MG/ML injection 0.5-1 mg  0.5-1 mg Intravenous Q4H PRN Rai, Ripudeep K, MD       nutrition supplement (JUVEN) (JUVEN) powder packet 1 packet  1 packet Oral BID BM Newt Minion, MD   1 packet at 05/19/22 1039   ondansetron (ZOFRAN) injection 4 mg  4 mg Intravenous Q6H PRN Newt Minion, MD       ondansetron [facility]) tablet 8 mg  8 mg Oral BID PRN Newt Minion, MD   8 mg at 05/14/22 1754   pantoprazole (PROTONIX) EC tablet 40 mg  40 mg Oral Daily Newt Minion, MD   40 mg at 05/19/22 1039   phenol (CHLORASEPTIC) mouth spray 1 spray  1 spray Mouth/Throat PRN Newt Minion, MD       polyethylene glycol (MIRALAX /  GLYCOLAX) packet 17 g  17 g Oral Daily PRN Newt Minion, MD       potassium chloride SA (KLOR-CON M) CR tablet 20-40 mEq  20-40 mEq Oral Daily PRN Newt Minion, MD       QUEtiapine (SEROQUEL) tablet 12.5 mg  12.5 mg Oral QHS Rai, Ripudeep K, MD   12.5 mg at 05/19/22 2103   rosuvastatin (CRESTOR) tablet 20 mg  20 mg Oral Daily Newt Minion, MD   20 mg at 05/19/22 1039   senna-docusate (Senokot-S) tablet 1-2 tablet  1-2 tablet Oral Daily PRN Newt Minion, MD       sevelamer carbonate (RENVELA) packet 0.8 g  0.8 g Oral TID WC Newt Minion, MD   0.8 g at 05/19/22 1039   zinc sulfate capsule 220 mg  220 mg Oral Daily Newt Minion, MD   220 mg at 05/19/22 1039     Discharge Medications: Please see discharge summary for a list of discharge medications.  Relevant Imaging Results:  Relevant Lab Results:   Additional Information SSN: [id number], HD pt, currently MWF schdule, pt is vaccinated for covid with at least one booster.  Joanne Chars, LCSW

## 2022-05-20 NOTE — Procedures (Addendum)
Patient Name: Carolyn Russo  MRN: [id number]  Epilepsy Attending: [name]  Referring Physician/Provider: [name], MD Duration: 05/19/2022 2050 to 05/20/2022 8756   Patient history: 71yo F with h/o seizures now with ams. EEG to evaluate for seizure.   Level of alertness:  lethargic    AEDs during EEG study: LEV   Technical aspects: This EEG study was done with scalp electrodes positioned according to the 10-20 International system of electrode placement. Electrical activity was reviewed with band pass filter of 1-'70Hz'$ , sensitivity of 7 uV/mm, display speed of 71m/sec with a '60Hz'$  notched filter applied as appropriate. EEG data were recorded continuously and digitally stored.  Video monitoring was available and reviewed as appropriate.   Description: EEG showed continuous generalized 3 to 6 Hz theta-delta slowing. Generalized periodic discharges with triphasic morphology at  1.5-2.'5Hz'$  were noted intermittently, more prominent when awake/stimulated. Hyperventilation and photic stimulation were not performed.      ABNORMALITY - Periodic discharges with triphasic morphology, generalized ( GPDs) - Continuous slow, generalized   IMPRESSION: This study showed generalized periodic discharges with triphasic morphology which can be on the ictal-interictal continuum.  However, the morphology, frequency and reactivity to stimulation is more likely indicative of toxic-metabolic causes.  Additionally there is moderate to severe diffuse encephalopathy, nonspecific etiology. No seizures were seen throughout the recording.   Emran Molzahn OBarbra Sarks

## 2022-05-20 NOTE — Progress Notes (Addendum)
Neurology Progress Note   S:// Patients husband is at the bedside. He states she is improving slowly. Patient is laying in bed on LTM in NAD. She is lethargic, awakens to noxious stimuli. She is oriented to self only, minimal verbal output, will not repeat, intermittently following very simple commands. Right gaze, no blink to threat on left. She withdraws to painful stimuli in all 4 extremities. Right BKA She is going to HD again today now.   O:// Current vital signs: BP (!) 156/63 (BP Location: Left Arm)   Pulse 88   Temp 98.3 F (36.8 C) (Oral)   Resp 18   Ht '5\' 5"'$  (1.651 m)   Wt 81.5 kg   SpO2 100%   BMI 29.90 kg/m  Vital signs in last 24 hours: Temp:  [97.4 F (36.3 C)-98.3 F (36.8 C)] 98.3 F (36.8 C) (07/27 2014) Pulse Rate:  [70-93] 88 (07/27 2014) Resp:  [16-24] 18 (07/27 2014) BP: (123-193)/(45-103) 156/63 (07/27 2014) SpO2:  [99 %-100 %] 100 % (07/27 2014) Weight:  [81.5 kg-82.5 kg] 81.5 kg (07/27 1630)  GENERAL: lethargic elderly female in NAD HEENT: - Normocephalic and atraumatic, dry mm LUNGS - Clear to auscultation bilaterally with no wheezes CV - S1S2 RRR, no m/r/g, equal pulses bilaterally. ABDOMEN - Soft, nontender, nondistended with normoactive BS Ext: warm, well perfused, intact peripheral pulses, no edema NEURO:  Mental Status: AA&Ox1 self. Lethargic, awakens briefly to noxious stimuli. Does not repeat, will intermittently follow commands Language: speech is appears clear, minimal output.  Cranial Nerves: PERRL 2 mm/brisk. EOM with right gaze, visual fields no blink to threat on the left, no facial asymmetry, facial sensation intact, hearing intact, tongue/uvula/soft palate midline, normal sternocleidomastoid and trapezius muscle strength. No evidence of tongue atrophy or fibrillations Motor: spontaneously moving bilateral uppers, withdraws in bilateral lowers Tone: is normal and bulk is normal Sensation- Intact to light touch bilaterally Coordination:  unable to assess Gait- deferred    Medications  Current Facility-Administered Medications:    (feeding supplement) PROSource Plus liquid 30 mL, 30 mL, Oral, BID BM, Newt Minion, MD, 30 mL at 05/19/22 1040   0.9 %  sodium chloride infusion, , Intravenous, Continuous, Newt Minion, MD   acetaminophen (TYLENOL) tablet 1,000 mg, 1,000 mg, Oral, Q8H, Newt Minion, MD, 1,000 mg at 05/19/22 3903   acetaminophen (TYLENOL) tablet 325-650 mg, 325-650 mg, Oral, Q6H PRN, Newt Minion, MD   albuterol (PROVENTIL) (2.5 MG/3ML) 0.083% nebulizer solution 2.5 mg, 2.5 mg, Nebulization, Q2H PRN, Newt Minion, MD   alum & mag hydroxide-simeth (MAALOX/MYLANTA) 200-200-20 MG/5ML suspension 15-30 mL, 15-30 mL, Oral, Q2H PRN, Newt Minion, MD   ascorbic acid (VITAMIN C) tablet 1,000 mg, 1,000 mg, Oral, Daily, Newt Minion, MD, 1,000 mg at 05/19/22 1039   aspirin chewable tablet 81 mg, 81 mg, Oral, Daily, Newt Minion, MD, 81 mg at 05/19/22 1039   bisacodyl (DULCOLAX) EC tablet 5 mg, 5 mg, Oral, Daily PRN, Newt Minion, MD   carvedilol (COREG) tablet 3.125 mg, 3.125 mg, Oral, BID WC, Newt Minion, MD, 3.125 mg at 05/18/22 0801   Chlorhexidine Gluconate Cloth 2 % PADS 6 each, 6 each, Topical, Q0600, Newt Minion, MD, 6 each at 05/20/22 0092   Darbepoetin Alfa (ARANESP) injection 200 mcg, 200 mcg, Intravenous, Q Fri-HD, Newt Minion, MD, 200 mcg at 05/13/22 1130   docusate sodium (COLACE) capsule 100 mg, 100 mg, Oral, Daily, Newt Minion, MD, 100 mg  at 05/19/22 1039   feeding supplement (NEPRO CARB STEADY) liquid 237 mL, 237 mL, Oral, BID BM, Newt Minion, MD, 237 mL at 05/17/22 1450   fluticasone (FLONASE) 50 MCG/ACT nasal spray 2 spray, 2 spray, Each Nare, Daily PRN, Newt Minion, MD   guaiFENesin-dextromethorphan (ROBITUSSIN DM) 100-10 MG/5ML syrup 15 mL, 15 mL, Oral, Q4H PRN, Newt Minion, MD   heparin injection 5,000 Units, 5,000 Units, Subcutaneous, Q8H, Newt Minion, MD, 5,000  Units at 05/20/22 3220   hydrALAZINE (APRESOLINE) injection 5 mg, 5 mg, Intravenous, Q20 Min PRN, Newt Minion, MD   hydrALAZINE (APRESOLINE) tablet 10 mg, 10 mg, Oral, Q8H PRN, Newt Minion, MD   HYDROcodone-acetaminophen (NORCO/VICODIN) 5-325 MG per tablet 1 tablet, 1 tablet, Oral, Q6H PRN, Rai, Ripudeep K, MD   insulin aspart (novoLOG) injection 0-6 Units, 0-6 Units, Subcutaneous, TID WC, Newt Minion, MD, 1 Units at 05/19/22 0900   insulin glargine-yfgn (SEMGLEE) injection 4 Units, 4 Units, Subcutaneous, QHS, Newt Minion, MD, 4 Units at 05/19/22 2103   labetalol (NORMODYNE) injection 10 mg, 10 mg, Intravenous, Q10 min PRN, Newt Minion, MD   levETIRAcetam (KEPPRA) tablet 500 mg, 500 mg, Oral, QHS, Newt Minion, MD, 500 mg at 05/19/22 2103   losartan (COZAAR) tablet 100 mg, 100 mg, Oral, Daily, Penninger, Ria Comment, PA   magnesium sulfate IVPB 2 g 50 mL, 2 g, Intravenous, Daily PRN, Newt Minion, MD   methylphenidate (RITALIN) tablet 5 mg, 5 mg, Oral, Daily, Newt Minion, MD, 5 mg at 05/19/22 1039   metoprolol tartrate (LOPRESSOR) injection 2-5 mg, 2-5 mg, Intravenous, Q2H PRN, Newt Minion, MD   mometasone-formoterol [facility]) 200-5 MCG/ACT inhaler 2 puff, 2 puff, Inhalation, BID, Newt Minion, MD, 2 puff at 05/19/22 [phone]   morphine (PF) 2 MG/ML injection 0.5-1 mg, 0.5-1 mg, Intravenous, Q4H PRN, Rai, Ripudeep K, MD   nutrition supplement (JUVEN) (JUVEN) powder packet 1 packet, 1 packet, Oral, BID BM, Newt Minion, MD, 1 packet at 05/19/22 1039   ondansetron (ZOFRAN) injection 4 mg, 4 mg, Intravenous, Q6H PRN, Newt Minion, MD   ondansetron [facility]) tablet 8 mg, 8 mg, Oral, BID PRN, Newt Minion, MD, 8 mg at 05/14/22 1754   pantoprazole (PROTONIX) EC tablet 40 mg, 40 mg, Oral, Daily, Newt Minion, MD, 40 mg at 05/19/22 1039   phenol (CHLORASEPTIC) mouth spray 1 spray, 1 spray, Mouth/Throat, PRN, Newt Minion, MD   polyethylene glycol (MIRALAX / GLYCOLAX) packet 17  g, 17 g, Oral, Daily PRN, Newt Minion, MD   potassium chloride SA (KLOR-CON M) CR tablet 20-40 mEq, 20-40 mEq, Oral, Daily PRN, Newt Minion, MD   QUEtiapine (SEROQUEL) tablet 12.5 mg, 12.5 mg, Oral, QHS, Rai, Ripudeep K, MD, 12.5 mg at 05/19/22 2103   rosuvastatin (CRESTOR) tablet 20 mg, 20 mg, Oral, Daily, Newt Minion, MD, 20 mg at 05/19/22 1039   senna-docusate (Senokot-S) tablet 1-2 tablet, 1-2 tablet, Oral, Daily PRN, Newt Minion, MD   sevelamer carbonate (RENVELA) packet 0.8 g, 0.8 g, Oral, TID WC, Newt Minion, MD, 0.8 g at 05/19/22 1039   zinc sulfate capsule 220 mg, 220 mg, Oral, Daily, Newt Minion, MD, 220 mg at 05/19/22 1039 Labs CBC    Component Value Date/Time   WBC 21.5 (H) 05/20/2022 0341   RBC 3.08 (L) 05/20/2022 0341   HGB 8.6 (L) 05/20/2022 0341   HGB 9.3 (L) 04/13/2022 1512  HCT 25.8 (L) 05/20/2022 0341   HCT 27.6 (L) 04/13/2022 1512   PLT 592 (H) 05/20/2022 0341   PLT 341 04/13/2022 1512   MCV 83.8 05/20/2022 0341   MCV 89 04/13/2022 1512   MCH 27.9 05/20/2022 0341   MCHC 33.3 05/20/2022 0341   RDW 19.8 (H) 05/20/2022 0341   RDW 16.1 (H) 04/13/2022 1512   LYMPHSABS 3.4 05/17/2022 0321   LYMPHSABS 2.4 04/13/2022 1512   MONOABS 2.2 (H) 05/17/2022 0321   EOSABS 0.4 05/17/2022 0321   EOSABS 0.4 04/13/2022 1512   BASOSABS 0.1 05/17/2022 0321   BASOSABS 0.1 04/13/2022 1512    CMP     Component Value Date/Time   NA 133 (L) 05/20/2022 0341   NA 142 04/13/2022 1512   K 3.9 05/20/2022 0341   CL 94 (L) 05/20/2022 0341   CO2 24 05/20/2022 0341   GLUCOSE 190 (H) 05/20/2022 0341   BUN 26 (H) 05/20/2022 0341   BUN 12 04/13/2022 1512   CREATININE 5.58 (H) 05/20/2022 0341   CREATININE 0.84 10/13/2011 0903   CALCIUM 9.5 05/20/2022 0341   PROT 7.2 05/15/2022 0928   PROT 7.1 04/13/2022 1512   ALBUMIN 2.5 (L) 05/20/2022 0341   ALBUMIN 3.9 04/13/2022 1512   AST 20 05/15/2022 0928   ALT 22 05/15/2022 0928   ALKPHOS 50 05/15/2022 0928   BILITOT 0.5  05/15/2022 0928   BILITOT 0.3 04/13/2022 1512   GFRNONAA 8 (L) 05/20/2022 0341   GFRNONAA 75 10/13/2011 0903   GFRAA 17 06/19/2020 0000   GFRAA 86 10/13/2011 0903    glycosylated hemoglobin  Lipid Panel     Component Value Date/Time   CHOL 96 (L) 04/13/2022 1526   TRIG 130 04/13/2022 1526   HDL 34 (L) 04/13/2022 1526   CHOLHDL 2.8 04/13/2022 1526   CHOLHDL 2.9 04/16/2021 1840   VLDL 18 04/16/2021 1840   LDLCALC 39 04/13/2022 1526     Imaging I have reviewed images in epic and the results pertinent to this consultation are:  CT-scan of the brain 7/27 No acute intracranial abnormality. Evidence of prior infarct, atrophy and chronic microvascular ischemic change without change.  rEEG 7/27- This study showed generalized periodic discharges with triphasic morphology  which can be on the ictal-interictal continuum. Recommend long term monitoring for further evaluation. Additionally there is moderate to severe diffuse encephalopathy, nonspecific etiology. No seizures were seen throughout the recording.  LTM 7/27 2050 to 7/28 6237 -This study showed generalized periodic discharges with triphasic morphology which can be on the ictal-interictal continuum.  However, the morphology, frequency and reactivity to stimulation is more likely indicative of toxic-metabolic causes. Additionally there is moderate to severe diffuse encephalopathy, nonspecific etiology. No seizures were seen throughout the recording  Assessment:  Carolyn Russo is a 71 y.o. AA female with extensive and complicated PMH as above and neurologic history significant for 2 CVAs involving right subcortical and right basal ganglia as above and seizure 2022 with recent admission 6/23 with dry gangrene of right great toe and underwent elective surgery per Dr. Sherryle Lis of podiatry. Patient then developed worsening progression of necrosis to the right forefoot for which amputation was recommended. Underwent right below-knee  amputation yesterday on 7/26 with Dr. Sharol Given. Now with persistent encephalopathy and without focal neurologic deficit which may be concerning for seizure activity.   Impression: Acute metabolic encephalopathy likely multifactorial due to critical illness and hospitalization, s/p general anesthesia, worsening leukocytosis, metabolic derangements, hospital associated delirium  Recommendations: -Delirium precautions - Seizure  precautions - Discontinue LTM - correct metabolic derangements - Continue Keppra daily  - Continue to assess for infectious source - Limit sedating medications as able  - Neurology will continue to follow  Beulah Gandy DNP, ACNPC-AG  I have seen the patient and reviewed the above note.  She has triphasic waves on EEG, consistent with toxic metabolic encephalopathy.  At this time, her son states that she seems slightly better than she has in previous days, and I suspect this is a multifactorial delirium and would expect gradual improvement.  Neurology will follow.  Roland Rack, MD [facility] [phone]  If 7pm- 7am, please page neurology on call as listed in [place].

## 2022-05-20 NOTE — Progress Notes (Signed)
Pt taken off EEG and transported to HD

## 2022-05-20 NOTE — Plan of Care (Signed)
  Problem: Education: Goal: Ability to describe self-care measures that may prevent or decrease complications (Diabetes Survival Skills Education) will improve Outcome: Progressing Goal: Individualized Educational Video(s) Outcome: Progressing   Problem: Coping: Goal: Ability to adjust to condition or change in health will improve Outcome: Progressing   Problem: Fluid Volume: Goal: Ability to maintain a balanced intake and output will improve Outcome: Progressing   Problem: Health Behavior/Discharge Planning: Goal: Ability to identify and utilize available resources and services will improve Outcome: Progressing Goal: Ability to manage health-related needs will improve Outcome: Progressing   Problem: Metabolic: Goal: Ability to maintain appropriate glucose levels will improve Outcome: Progressing   Problem: Nutritional: Goal: Maintenance of adequate nutrition will improve Outcome: Progressing Goal: Progress toward achieving an optimal weight will improve Outcome: Progressing   Problem: Skin Integrity: Goal: Risk for impaired skin integrity will decrease Outcome: Progressing   Problem: Tissue Perfusion: Goal: Adequacy of tissue perfusion will improve Outcome: Progressing   Problem: Education: Goal: Knowledge of General Education information will improve Description: Including pain rating scale, medication(s)/side effects and non-pharmacologic comfort measures Outcome: Progressing   Problem: Health Behavior/Discharge Planning: Goal: Ability to manage health-related needs will improve Outcome: Progressing   Problem: Clinical Measurements: Goal: Ability to maintain clinical measurements within normal limits will improve Outcome: Progressing Goal: Will remain free from infection Outcome: Progressing Goal: Diagnostic test results will improve Outcome: Progressing Goal: Respiratory complications will improve Outcome: Progressing Goal: Cardiovascular complication will  be avoided Outcome: Progressing   Problem: Nutrition: Goal: Adequate nutrition will be maintained Outcome: Progressing   Problem: Activity: Goal: Risk for activity intolerance will decrease Outcome: Progressing   Problem: Coping: Goal: Level of anxiety will decrease Outcome: Progressing   Problem: Elimination: Goal: Will not experience complications related to bowel motility Outcome: Progressing Goal: Will not experience complications related to urinary retention Outcome: Progressing   Problem: Safety: Goal: Ability to remain free from injury will improve Outcome: Progressing   Problem: Skin Integrity: Goal: Risk for impaired skin integrity will decrease Outcome: Progressing   Problem: Education: Goal: Knowledge of the prescribed therapeutic regimen will improve Outcome: Progressing Goal: Ability to verbalize activity precautions or restrictions will improve Outcome: Progressing Goal: Understanding of discharge needs will improve Outcome: Progressing   Problem: Self-Concept: Goal: Ability to maintain and perform role responsibilities to the fullest extent possible will improve Outcome: Progressing   Problem: Pain Management: Goal: Pain level will decrease with appropriate interventions Outcome: Progressing   Problem: Skin Integrity: Goal: Demonstration of wound healing without infection will improve Outcome: Progressing

## 2022-05-20 NOTE — Progress Notes (Addendum)
[facility]                                                                              Carolyn Russo, is a 71 y.o. female, DOB - [birth year], ZHY:[id number] Admit date - 05/10/2022    Outpatient Primary MD for the patient is [name], MD  LOS - 10  days  Chief Complaint  Patient presents with   Cellulitis       Brief summary   Patient is a 71 year old female with ESRD on HD MWF, acute basal ganglia CVA with left-sided weakness on 11/9526 complicated by seizures, diabetes, HTN, HLD, HFrEF, PAD with associated ischemia, history of lower GI bleed, recent admission 6/23 with dry gangrene of right great toe and underwent elective surgery per Dr. Sherryle Lis of podiatry, return to ED with slight worsening of the leg despite completed 2 weeks of cefadroxil.  Patient noted to have worsening gangrenous changes to the right forefoot. Patient was admitted for further work-up  Assessment & Plan    Gangrene right foot with history of PAD, recent elective right great toe amputation -Vascular surgery and Dr. Sharol Given consulted -Underwent right below-knee amputation on 7/26, postop day #1 -Patient was placed on vancomycin and cefepime, discontinued cefepime on 7/26, DC'd vancomycin on 7/27 (discussed with Dr. Sharol Given, no further need of antibiotics)  Acute metabolic encephalopathy with intermittent confusion In the setting of recent CVA in 03/2021 -At baseline, alert oriented x3, per husband at the bedside mental status was " sharp" before hospitalization. - Ammonia level 11 on 7/24, B12, folate in normal limits -Chest x-ray on 7/24 did not show any infiltrates -Patient has a known history of CVA in 03/2021, had 1 episode of seizure.   - cefepime also has known side effect of encephalopathy, cefepime was discontinued on 7/26  -Opioids were decreased however patient has not required that many doses.  -CT head negative for acute CVA  -EEG showed generalized periodic discharges, more  likely indicative of toxic metabolic causes, no acute seizures. -Neurology following.   - more somnolent today, will discontinue Seroquel, follow UA, urine culture -Resumed Plavix, was on Plavix prior to surgery due to history of CVA  Leukocytosis -WBCs trending up, obtain procalcitonin, UA and culture, blood cultures, procalcitonin. -CT head showed no new stroke.  Chest x-ray 7/24 showed no infiltrates.   ESRD on hemodialysis, MWF -HD per nephrology  Prior history of basal ganglia CVA in 03/2021 with residual left-sided weakness History of seizures -Currently no seizure activity on EEG - continue Keppra 500 mg at bedtime -Continue Plavix, Crestor    Diabetes mellitus type 2, IDDM with complications of ESRD, CVA Recent Labs    05/18/22 1959 05/19/22 0751 05/19/22 1721 05/19/22 2117 05/20/22 0759 05/20/22 1324  GLUCAP 175* 189* 169* 178* 192* 245*   -Continue low-dose Lantus, SSI  Anemia, prior history of GI bleed -H&H stable  Obesity Estimated body mass index is 29.17 kg/m as calculated from the following:   Height as of this encounter: '5\' 5"'$  (1.651 m).   Weight as of this encounter: 79.5 kg.  Code Status: Full CODE STATUS DVT Prophylaxis:  SCD's Start: 05/18/22  1213 heparin injection 5,000 Units Start: 05/13/22 1415 Place TED hose Start: 05/10/22 1813   Level of Care: Level of care: Med-Surg Family Communication: Updated patient's husband, Dr. [name] at the bedside today   Disposition Plan:      Remains inpatient appropriate: Somnolent, hospitalization complicated with delirium, work-up in progress   Procedures:  Right BKA  Consultants:   Nephrology Orthopedics Vascular surgery  Antimicrobials:   Anti-infectives (From admission, onward)    Start     Dose/Rate Route Frequency Ordered Stop   05/18/22 1300  ceFAZolin (ANCEF) IVPB 2g/100 mL premix  Status:  Discontinued        2 g 200 mL/hr over 30 Minutes Intravenous Every 8 hours 05/18/22 1212  05/18/22 1233   05/14/22 1130  vancomycin (VANCOCIN) IVPB 1000 mg/200 mL premix        1,000 mg 200 mL/hr over 60 Minutes Intravenous  Once 05/14/22 1038 05/14/22 1429   05/11/22 1900  ceFEPIme (MAXIPIME) 1 g in sodium chloride 0.9 % 100 mL IVPB  Status:  Discontinued        1 g 200 mL/hr over 30 Minutes Intravenous Every 24 hours 05/10/22 1715 05/18/22 1633   05/11/22 1200  vancomycin (VANCOCIN) IVPB 1000 mg/200 mL premix  Status:  Discontinued        1,000 mg 200 mL/hr over 60 Minutes Intravenous Every M-W-F (Hemodialysis) 05/10/22 1715 05/19/22 0925   05/10/22 1730  vancomycin (VANCOREADY) IVPB 1750 mg/350 mL        1,750 mg 175 mL/hr over 120 Minutes Intravenous  Once 05/10/22 1715 05/10/22 2151   05/10/22 1730  ceFEPIme (MAXIPIME) 2 g in sodium chloride 0.9 % 100 mL IVPB        2 g 200 mL/hr over 30 Minutes Intravenous  Once 05/10/22 1715 05/10/22 1840          Medications  (feeding supplement) PROSource Plus  30 mL Oral BID BM   acetaminophen  1,000 mg Oral Q8H   vitamin C  1,000 mg Oral Daily   aspirin  81 mg Oral Daily   carvedilol  3.125 mg Oral BID WC   Chlorhexidine Gluconate Cloth  6 each Topical Q0600   clopidogrel  75 mg Oral Daily   darbepoetin (ARANESP) injection - DIALYSIS  200 mcg Intravenous Q Fri-HD   docusate sodium  100 mg Oral Daily   feeding supplement (NEPRO CARB STEADY)  237 mL Oral BID BM   heparin injection (subcutaneous)  5,000 Units Subcutaneous Q8H   heparin sodium (porcine)       insulin aspart  0-6 Units Subcutaneous TID WC   insulin glargine-yfgn  4 Units Subcutaneous QHS   levETIRAcetam  500 mg Oral QHS   losartan  100 mg Oral Daily   methylphenidate  5 mg Oral Daily   mometasone-formoterol  2 puff Inhalation BID   nutrition supplement (JUVEN)  1 packet Oral BID BM   pantoprazole  40 mg Oral Daily   rosuvastatin  20 mg Oral Daily   sevelamer carbonate  0.8 g Oral TID WC   zinc sulfate  220 mg Oral Daily      Subjective:   Carolyn Russo was seen and examined today.  Somnolent, transiently opens eyes and answered few questions but was confused.  Husband in the room.  Not following commands.  No fevers, pain, diarrhea or vomiting.  Difficult to obtain ROS from the patient.  Objective:   Vitals:   05/20/22 1210 05/20/22 1215 05/20/22 1300  05/20/22 1327  BP: (!) 98/59 102/75  (!) 127/51  Pulse:    87  Resp:    19  Temp:  98.6 F (37 C)  99.4 F (37.4 C)  TempSrc:  Oral  Oral  SpO2:    100%  Weight:   79.5 kg   Height:        Intake/Output Summary (Last 24 hours) at 05/20/2022 1407 Last data filed at 05/20/2022 1250 Gross per 24 hour  Intake --  Output 2902 ml  Net -2902 ml     Wt Readings from Last 3 Encounters:  05/20/22 79.5 kg  04/15/22 81.7 kg  03/30/22 93.9 kg   Physical Exam General: Somnolent Cardiovascular: S1 S2 clear, RRR.  Respiratory: CTAB, no wheezing, rales or rhonchi Gastrointestinal: Soft, nontender, nondistended, NBS Ext: right BKA Neuro: not following commands Psych: somnolent  Data Reviewed:  I have personally reviewed following labs    CBC Lab Results  Component Value Date   WBC 21.5 (H) 05/20/2022   RBC 3.08 (L) 05/20/2022   HGB 8.6 (L) 05/20/2022   HCT 25.8 (L) 05/20/2022   MCV 83.8 05/20/2022   MCH 27.9 05/20/2022   PLT 592 (H) 05/20/2022   MCHC 33.3 05/20/2022   RDW 19.8 (H) 05/20/2022   LYMPHSABS 3.4 05/17/2022   MONOABS 2.2 (H) 05/17/2022   EOSABS 0.4 05/17/2022   BASOSABS 0.1 87/68/1157     Last metabolic panel Lab Results  Component Value Date   NA 133 (L) 05/20/2022   K 3.9 05/20/2022   CL 94 (L) 05/20/2022   CO2 24 05/20/2022   BUN 26 (H) 05/20/2022   CREATININE 5.58 (H) 05/20/2022   GLUCOSE 190 (H) 05/20/2022   GFRNONAA 8 (L) 05/20/2022   GFRAA 17 06/19/2020   CALCIUM 9.5 05/20/2022   PHOS 4.4 05/20/2022   PROT 7.2 05/15/2022   ALBUMIN 2.5 (L) 05/20/2022   LABGLOB 3.2 04/13/2022   AGRATIO 1.2 04/13/2022   BILITOT 0.5 05/15/2022    ALKPHOS 50 05/15/2022   AST 20 05/15/2022   ALT 22 05/15/2022   ANIONGAP 15 05/20/2022    CBG (last 3)  Recent Labs    05/19/22 2117 05/20/22 0759 05/20/22 1324  GLUCAP 178* 192* 245*        Natasha Paulson M.D. [facility] 05/20/2022, 2:07 PM  Available via Epic secure chat 7am-7pm After 7 pm, please refer to night coverage provider listed on amion.

## 2022-05-21 ENCOUNTER — Other Ambulatory Visit (INDEPENDENT_AMBULATORY_CARE_PROVIDER_SITE_OTHER): Payer: Medicare PPO | Admitting: Internal Medicine

## 2022-05-21 ENCOUNTER — Other Ambulatory Visit: Payer: Self-pay

## 2022-05-21 DIAGNOSIS — N186 End stage renal disease: Secondary | ICD-10-CM

## 2022-05-21 DIAGNOSIS — E1165 Type 2 diabetes mellitus with hyperglycemia: Secondary | ICD-10-CM

## 2022-05-21 DIAGNOSIS — Z992 Dependence on renal dialysis: Secondary | ICD-10-CM

## 2022-05-21 DIAGNOSIS — I5032 Chronic diastolic (congestive) heart failure: Secondary | ICD-10-CM

## 2022-05-21 DIAGNOSIS — Z89411 Acquired absence of right great toe: Secondary | ICD-10-CM

## 2022-05-21 DIAGNOSIS — R269 Unspecified abnormalities of gait and mobility: Secondary | ICD-10-CM | POA: Diagnosis not present

## 2022-05-21 DIAGNOSIS — I132 Hypertensive heart and chronic kidney disease with heart failure and with stage 5 chronic kidney disease, or end stage renal disease: Secondary | ICD-10-CM | POA: Diagnosis not present

## 2022-05-21 DIAGNOSIS — S88111A Complete traumatic amputation at level between knee and ankle, right lower leg, initial encounter: Secondary | ICD-10-CM | POA: Diagnosis not present

## 2022-05-21 DIAGNOSIS — L089 Local infection of the skin and subcutaneous tissue, unspecified: Secondary | ICD-10-CM | POA: Diagnosis not present

## 2022-05-21 DIAGNOSIS — E1122 Type 2 diabetes mellitus with diabetic chronic kidney disease: Secondary | ICD-10-CM

## 2022-05-21 DIAGNOSIS — D631 Anemia in chronic kidney disease: Secondary | ICD-10-CM

## 2022-05-21 DIAGNOSIS — I96 Gangrene, not elsewhere classified: Secondary | ICD-10-CM

## 2022-05-21 DIAGNOSIS — J4520 Mild intermittent asthma, uncomplicated: Secondary | ICD-10-CM | POA: Diagnosis not present

## 2022-05-21 DIAGNOSIS — I6381 Other cerebral infarction due to occlusion or stenosis of small artery: Secondary | ICD-10-CM | POA: Diagnosis not present

## 2022-05-21 DIAGNOSIS — H2523 Age-related cataract, morgagnian type, bilateral: Secondary | ICD-10-CM | POA: Diagnosis not present

## 2022-05-21 DIAGNOSIS — Z4781 Encounter for orthopedic aftercare following surgical amputation: Secondary | ICD-10-CM

## 2022-05-21 DIAGNOSIS — E669 Obesity, unspecified: Secondary | ICD-10-CM

## 2022-05-21 DIAGNOSIS — D72828 Other elevated white blood cell count: Secondary | ICD-10-CM

## 2022-05-21 DIAGNOSIS — Z794 Long term (current) use of insulin: Secondary | ICD-10-CM

## 2022-05-21 DIAGNOSIS — D72829 Elevated white blood cell count, unspecified: Secondary | ICD-10-CM | POA: Diagnosis not present

## 2022-05-21 DIAGNOSIS — N184 Chronic kidney disease, stage 4 (severe): Secondary | ICD-10-CM | POA: Diagnosis not present

## 2022-05-21 DIAGNOSIS — Z9181 History of falling: Secondary | ICD-10-CM

## 2022-05-21 DIAGNOSIS — N185 Chronic kidney disease, stage 5: Secondary | ICD-10-CM

## 2022-05-21 LAB — URINE CULTURE: Culture: NO GROWTH

## 2022-05-21 LAB — GLUCOSE, CAPILLARY
Glucose-Capillary: 262 mg/dL — ABNORMAL HIGH (ref 70–99)
Glucose-Capillary: 265 mg/dL — ABNORMAL HIGH (ref 70–99)
Glucose-Capillary: 259 mg/dL — ABNORMAL HIGH (ref 70–99)
Glucose-Capillary: 227 mg/dL — ABNORMAL HIGH (ref 70–99)

## 2022-05-21 LAB — CULTURE, BLOOD (ROUTINE X 2)
Culture: NO GROWTH
Culture: NO GROWTH
Special Requests: ADEQUATE

## 2022-05-21 LAB — CBC WITH DIFFERENTIAL/PLATELET
Abs Immature Granulocytes: 0.64 10*3/uL — ABNORMAL HIGH (ref 0.00–0.07)
Basophils Absolute: 0.1 10*3/uL (ref 0.0–0.1)
Basophils Relative: 0 %
Eosinophils Absolute: 0 10*3/uL (ref 0.0–0.5)
Eosinophils Relative: 0 %
HCT: 26.9 % — ABNORMAL LOW (ref 36.0–46.0)
Hemoglobin: 9.2 g/dL — ABNORMAL LOW (ref 12.0–15.0)
Immature Granulocytes: 2 %
Lymphocytes Relative: 11 %
Lymphs Abs: 4.2 10*3/uL — ABNORMAL HIGH (ref 0.7–4.0)
MCH: 29.1 pg (ref 26.0–34.0)
MCHC: 34.2 g/dL (ref 30.0–36.0)
MCV: 85.1 fL (ref 80.0–100.0)
Monocytes Absolute: 5.1 10*3/uL — ABNORMAL HIGH (ref 0.1–1.0)
Monocytes Relative: 13 %
Neutro Abs: 28.6 10*3/uL — ABNORMAL HIGH (ref 1.7–7.7)
Neutrophils Relative %: 74 %
Platelets: 535 10*3/uL — ABNORMAL HIGH (ref 150–400)
RBC: 3.16 MIL/uL — ABNORMAL LOW (ref 3.87–5.11)
RDW: 19.9 % — ABNORMAL HIGH (ref 11.5–15.5)
Smear Review: NORMAL
WBC: 38.8 10*3/uL — ABNORMAL HIGH (ref 4.0–10.5)
nRBC: 0.1 % (ref 0.0–0.2)

## 2022-05-21 LAB — RENAL FUNCTION PANEL
Albumin: 2.6 g/dL — ABNORMAL LOW (ref 3.5–5.0)
Anion gap: 13 (ref 5–15)
BUN: 28 mg/dL — ABNORMAL HIGH (ref 8–23)
CO2: 28 mmol/L (ref 22–32)
Calcium: 10.1 mg/dL (ref 8.9–10.3)
Chloride: 95 mmol/L — ABNORMAL LOW (ref 98–111)
Creatinine, Ser: 5.42 mg/dL — ABNORMAL HIGH (ref 0.44–1.00)
GFR, Estimated: 8 mL/min — ABNORMAL LOW (ref >60)
Glucose, Bld: 243 mg/dL — ABNORMAL HIGH (ref 70–99)
Phosphorus: 5.1 mg/dL — ABNORMAL HIGH (ref 2.5–4.6)
Potassium: 3.7 mmol/L (ref 3.5–5.1)
Sodium: 136 mmol/L (ref 135–145)

## 2022-05-21 MED ORDER — HYDROCORTISONE (PERIANAL) 2.5 % EX CREA
TOPICAL_CREAM | Freq: Every day | CUTANEOUS | Status: DC | PRN
  Filled 2022-05-21: qty 28.35

## 2022-05-21 MED ORDER — DIPHENHYDRAMINE HCL 25 MG PO CAPS
50.0000 mg | ORAL_CAPSULE | Freq: Once | ORAL | Status: AC
  Administered 2022-05-22: 50 mg via ORAL
  Filled 2022-05-21: qty 2

## 2022-05-21 MED ORDER — DIPHENHYDRAMINE HCL 50 MG/ML IJ SOLN: 50.0000 mg | Freq: Once | INTRAMUSCULAR | Status: AC

## 2022-05-21 MED ORDER — PREDNISONE 20 MG PO TABS
50.0000 mg | ORAL_TABLET | Freq: Four times a day (QID) | ORAL | Status: AC
  Administered 2022-05-21 – 2022-05-22 (×3): 50 mg via ORAL
  Filled 2022-05-21 (×3): qty 1

## 2022-05-21 NOTE — Progress Notes (Signed)
CC: HOME HEALTH CERT  Received home health orders orders from [facility]. Start of care 04/20/22.   Certification and orders from 04/20/22 through 06/18/22 are reviewed, signed and faxed back to home health company.  Need of intermittent skilled services at home: SN, PT, ST, MSW  The home health care plan has been established by me and will be reviewed and updated as needed to maximize patient recovery.  I certify that all home health services have been and will be furnished to the patient while under my care.  Face-to-face encounter in which the need for home health services was established: PLEASE SEE D/C SUMMARY DATED 04/17/22  Patient is receiving home health services for the following diagnoses: Problem List Items Addressed This Visit       Cardiovascular and Mediastinum   Chronic diastolic CHF (congestive heart failure) ([place])     Endocrine   Uncontrolled type 2 diabetes mellitus with hyperglycemia ([place])   Type 2 diabetes mellitus ([place])     Genitourinary   CKD (chronic kidney disease), stage V ([place])   ESRD on dialysis ([place])     Other   Age-related hypermature cataract of both eyes   Abnormality of gait   Obesity (BMI 30-39.9)   Foot infection   Gangrene of right foot ([place])   Other Visit Diagnoses     Malignant hypertension with heart failure and end-stage renal dis ([place])    -  Primary   Encounter for orthopedic aftercare following surgical amputation       Type 2 diabetes mellitus with ESRD (end-stage renal disease) ([place])       Anemia in end-stage renal disease ([place])       Poorly controlled diabetes mellitus ([place])       Acquired absence of right great toe ([place])       History of fall            Maximino Greenland, MD

## 2022-05-21 NOTE — Consult Note (Signed)
Date of Admission:  05/10/2022          Reason for Consult: Leukocytosis    Referring Provider: Estill Cotta, MD   Assessment:  Leukocytosis Recent gangrene of right toe in context of peripheral arterial disease status post transtibial amputation by Dr. Sharol Given End-stage renal disease on hemodialysis History of stroke with left-sided weakness Recent postoperative confusion about cephalopathy partly blamed on cefepime Heart failure Reported history of contrast allergy--though apparently was something that happened 30 is ago.  Not clear if she needed premedication for her contrasted studies performed recently  Plan:  Will not add any antibiotics at this point Will order CT chest, abdomen and pelvis w contrast but endeavor to find out if she needs premedication or not if not this allergy should be removed from her chart Repeat CBC w diff in am Followup blood cultures  Principal Problem:   Foot infection Active Problems:   Hypertensive disorder   Type 2 diabetes mellitus (HCC)   Dyslipidemia (high LDL; low HDL)   Obesity (BMI 30-39.9)   CAD (coronary artery disease), native coronary artery   Asthma   Anemia in chronic kidney disease   Diabetic retinopathy (HCC)   Basal ganglia infarction ([place])   ESRD on dialysis ([place])   Gangrene of right foot ([place])   PAD (peripheral artery disease) ([place])   Scheduled Meds:  (feeding supplement) PROSource Plus  30 mL Oral BID BM   acetaminophen  1,000 mg Oral Q8H   vitamin C  1,000 mg Oral Daily   carvedilol  3.125 mg Oral BID WC   Chlorhexidine Gluconate Cloth  6 each Topical Q0600   clopidogrel  75 mg Oral Daily   darbepoetin (ARANESP) injection - DIALYSIS  200 mcg Intravenous Q Fri-HD   docusate sodium  100 mg Oral Daily   feeding supplement (NEPRO CARB STEADY)  237 mL Oral BID BM   heparin injection (subcutaneous)  5,000 Units Subcutaneous Q8H   insulin aspart  0-6 Units Subcutaneous TID WC   insulin glargine-yfgn  4 Units  Subcutaneous QHS   levETIRAcetam  500 mg Oral QHS   losartan  100 mg Oral Daily   methylphenidate  5 mg Oral Daily   mometasone-formoterol  2 puff Inhalation BID   nutrition supplement (JUVEN)  1 packet Oral BID BM   pantoprazole  40 mg Oral Daily   rosuvastatin  20 mg Oral Daily   sevelamer carbonate  0.8 g Oral TID WC   zinc sulfate  220 mg Oral Daily   Continuous Infusions:  sodium chloride     ferric gluconate (FERRLECIT) IVPB 125 mg (05/20/22 1400)   magnesium sulfate bolus IVPB     PRN Meds:.acetaminophen, albuterol, alum & mag hydroxide-simeth, bisacodyl, fluticasone, guaiFENesin-dextromethorphan, hydrALAZINE, hydrALAZINE, labetalol, magnesium sulfate bolus IVPB, metoprolol tartrate, morphine injection, ondansetron, ondansetron, phenol, polyethylene glycol, potassium chloride, senna-docusate, witch hazel-glycerin  HPI: Zennie AZHIA SIEFKEN is a 71 y.o. female with multiple medical problems including basal ganglia stroke with left-sided weakness with history of seizures diabetes hypertension hyperlipidemia peripheral artery disease end-stage renal disease on hemodialysis with ischemic necrotic toe admitted with dry gangrene status post transtibial amputation by Dr. Sharol Given.  The patient had been on vancomycin and cefepime postoperatively but had confusion and cefepime was discontinued followed by vancomycin.  Her confusion had improved better white count rose to 38,000 today I have noted on review of her labs that she has had a chronically elevated white blood cell count in terms of labs  that we have access to in our system.  I am skeptical that she has infection at another site though I think it is prudent that Dr. Sharol Given reexamine the operative site I would be surprised if it is infected.  I would like to get a CT chest abdomen pelvis and nephrology are okay with this.  The patient does have a history of contrast allergy though she has had contrasted studies done recently and the contrast  allergy, it is that she had a reaction 30 years ago and trying to endeavor to see if she needs premedication for her CT scans.  I think more than anything we need patients and that her CBC will come back down from 38,000 again she has rarely had a white blood cell count in the normal range and the labs that I have looked over the past 3 years  I spent 83 minutes with the patient including than 50% of the time in face to face counseling of the patient re her high WBC, personally reviewing inages  along with review of medical records in preparation for the visit and during the visit and in coordination of her care.    Review of Systems: Review of Systems  Unable to perform ROS: Mental status change    Past Medical History:  Diagnosis Date   Acute GI bleeding    Allergy    Anemia    Anterior chest wall pain    Appendicitis 1965   Asthma    Body mass index 37.0-37.9, adult    Breast pain    Cataract    both eyes   CHF (congestive heart failure) ([place])    Chronic kidney disease    stage 5 - on dialysis   Cognitive change 04/20/2021   r/t cva 05/1190   Complication of anesthesia    Memory loss after general   Dehydration 2014   Deviated septum 1971   Diabetes mellitus    Dysphagia due to old stroke    easy to get strangled when eating   Dyspnea 2014   Extrinsic asthma    WITH ASTHMA ATTACK   Fibroid 1980   GERD (gastroesophageal reflux disease)    Heart murmur    History of migraine    History of seizure    with stroke   Hx gestational diabetes    Hyperlipidemia    Hypertension 2014   Inguinal hernia 1959   Malaise and fatigue 2014   Non-IgE mediated allergic asthma 2014   Obesity    Pelvic pain    Pregnancy, high-risk 1985   Stroke ([place]) 04/20/2021   (CVA) of right basal ganglia   Tonsillitis 1968   Uterine fibroid 1980   Visual field defect    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    Social History   Tobacco Use   Smoking status: Never   Smokeless tobacco: Never  Vaping  Use   Vaping Use: Never used  Substance Use Topics   Alcohol use: No   Drug use: No    Family History  Problem Relation Age of Onset   Cancer Mother        abdominal melamona   Psoriasis Mother    Alzheimer's disease Father    Cancer Cousin        colon    Diabetes Cousin    Diabetes Maternal Aunt    Colon cancer Neg Hx    Colon polyps Neg Hx    Esophageal cancer Neg Hx    Rectal  cancer Neg Hx    Stomach cancer Neg Hx    Breast cancer Neg Hx    Allergies  Allergen Reactions   Food Anaphylaxis    Peanuts; Almonds   Wheat Bran     Upset stomach    Statins Itching and Other (See Comments)    Generalized aches- tolerates crestor    Pork-Derived Products     Does not eat pork    Shellfish Allergy Other (See Comments)    Mouth gets raw   Sitagliptin Other (See Comments)    Januiva - unknown reaction    Tetracycline Other (See Comments)    Raw mouth   Contrast Media [Iodinated Contrast Media] Rash    Happened during CT scan over 30 years ago    OBJECTIVE: Blood pressure 139/66, pulse 85, temperature 98.3 F (36.8 C), temperature source Oral, resp. rate 17, height '5\' 5"'$  (1.651 m), weight 79.5 kg, SpO2 99 %.  Physical Exam Vitals reviewed.  Constitutional:      Appearance: She is obese.  Cardiovascular:     Rate and Rhythm: Rhythm irregular.  Pulmonary:     Effort: Pulmonary effort is normal. No respiratory distress.     Breath sounds: No wheezing.  Neurological:     Mental Status: She is alert.     Comments: LUE weakness  Psychiatric:        Attention and Perception: She is inattentive.        Mood and Affect: Mood normal.        Speech: Speech is delayed.        Behavior: Behavior is cooperative.        Thought Content: Thought content normal.        Cognition and Memory: Cognition is impaired. She exhibits impaired recent memory.   No decubitus ulcers and pICC is clean  Lab Results Lab Results  Component Value Date   WBC 38.8 (H) 05/21/2022   HGB  9.2 (L) 05/21/2022   HCT 26.9 (L) 05/21/2022   MCV 85.1 05/21/2022   PLT 535 (H) 05/21/2022    Lab Results  Component Value Date   CREATININE 5.42 (H) 05/21/2022   BUN 28 (H) 05/21/2022   NA 136 05/21/2022   K 3.7 05/21/2022   CL 95 (L) 05/21/2022   CO2 28 05/21/2022    Lab Results  Component Value Date   ALT 22 05/15/2022   AST 20 05/15/2022   ALKPHOS 50 05/15/2022   BILITOT 0.5 05/15/2022     Microbiology: Recent Results (from the past 240 hour(s))  Culture, blood (Routine X 2) w Reflex to ID Panel     Status: None   Collection Time: 05/16/22  1:18 PM   Specimen: BLOOD  Result Value Ref Range Status   Specimen Description BLOOD LEFT ANTECUBITAL  Final   Special Requests   Final    BOTTLES DRAWN AEROBIC AND ANAEROBIC Blood Culture adequate volume   Culture   Final    NO GROWTH 5 DAYS Performed at [facility], [address], [place], [place] [address]    Report Status 05/21/2022 FINAL  Final  Culture, blood (Routine X 2) w Reflex to ID Panel     Status: None   Collection Time: 05/16/22  1:27 PM   Specimen: BLOOD  Result Value Ref Range Status   Specimen Description BLOOD BLOOD LEFT HAND  Final   Special Requests   Final    BOTTLES DRAWN AEROBIC AND ANAEROBIC Blood Culture results may  not be optimal due to an inadequate volume of blood received in culture bottles   Culture   Final    NO GROWTH 5 DAYS Performed at [facility], [place] [address] [place], [place] [address]    Report Status 05/21/2022 FINAL  Final  Surgical pcr screen     Status: None   Collection Time: 05/17/22  6:45 PM   Specimen: Nasal Mucosa; Nasal Swab  Result Value Ref Range Status   MRSA, PCR NEGATIVE NEGATIVE Final   Staphylococcus aureus NEGATIVE NEGATIVE Final    Comment: (NOTE) The Xpert SA Assay (FDA approved for NASAL specimens in patients 12 years of age and older), is one component of a comprehensive surveillance program. It is not intended to diagnose infection nor  to guide or monitor treatment. Performed at [facility], [place] [address], [place], [place] [address]   Urine Culture     Status: None   Collection Time: 05/20/22  2:13 PM   Specimen: In/Out Cath Urine  Result Value Ref Range Status   Specimen Description IN/OUT CATH URINE  Final   Special Requests NONE  Final   Culture   Final    NO GROWTH Performed at [facility], [place] [address], [place], [place] [address]    Report Status 05/21/2022 FINAL  Final  Culture, blood (Routine X 2) w Reflex to ID Panel     Status: None (Preliminary result)   Collection Time: 05/20/22  2:55 PM   Specimen: BLOOD  Result Value Ref Range Status   Specimen Description BLOOD BLOOD LEFT HAND  Final   Special Requests   Final    BOTTLES DRAWN AEROBIC AND ANAEROBIC Blood Culture adequate volume   Culture   Final    NO GROWTH < 24 HOURS Performed at [facility], [place] [address], [place], [place] [address]    Report Status PENDING  Incomplete  Culture, blood (Routine X 2) w Reflex to ID Panel     Status: None (Preliminary result)   Collection Time: 05/20/22  3:05 PM   Specimen: BLOOD  Result Value Ref Range Status   Specimen Description BLOOD BLOOD LEFT HAND  Final   Special Requests   Final    BOTTLES DRAWN AEROBIC AND ANAEROBIC Blood Culture adequate volume   Culture   Final    NO GROWTH < 24 HOURS Performed at [facility], [place] [address] [place], [place] [address]    Report Status PENDING  Incomplete    Alcide Evener, [place] [facility] [phone] pager  05/21/2022, 3:53 PM

## 2022-05-21 NOTE — Progress Notes (Signed)
[name] Progress Note   Subjective:    Seen and examined patient at bedside. She is much awake/alert and interactive compared to yesterday on HD. Tolerated yesterday's HD with net UF 2L. Next HD 7/31.  Objective Vitals:   05/20/22 2145 05/21/22 0501 05/21/22 0756 05/21/22 0800  BP:  (!) 147/77 139/66   Pulse:  84 87 85  Resp:   15 17  Temp:  97.7 F (36.5 C) 98.3 F (36.8 C)   TempSrc:  Oral Oral   SpO2: 100% 98% 100% 99%  Weight:      Height:       Physical Exam General: Chronically ill-appearing; on RA; NAD Neuro: More awake/alert today Heart: RRR; No M/R/G; S1 and S2 Lungs: Clear anteriorly and diminished mid lobes Abdomen: Soft and non-tender Extremities: R BKA-stump protector in place; no edema LLE Dialysis Access: [facility]   Filed Weights   05/20/22 0925 05/20/22 0941 05/20/22 1300  Weight: 80.7 kg 80.7 kg 79.5 kg    Intake/Output Summary (Last 24 hours) at 05/21/2022 1140 Last data filed at 05/21/2022 1000 Gross per 24 hour  Intake 350 ml  Output 2 ml  Net 348 ml    Additional Objective Labs: Basic Metabolic Panel: Recent Labs  Lab 05/19/22 0305 05/20/22 0341 05/21/22 0149  NA 135 133* 136  K 4.3 3.9 3.7  CL 99 94* 95*  CO2 '23 24 28  '$ GLUCOSE 201* 190* 243*  BUN 50* 26* 28*  CREATININE 9.15* 5.58* 5.42*  CALCIUM 9.6 9.5 10.1  PHOS 5.9* 4.4 5.1*   Liver Function Tests: Recent Labs  Lab 05/15/22 0928 05/16/22 0213 05/19/22 0305 05/20/22 0341 05/21/22 0149  AST 20  --   --   --   --   ALT 22  --   --   --   --   ALKPHOS 50  --   --   --   --   BILITOT 0.5  --   --   --   --   PROT 7.2  --   --   --   --   ALBUMIN 2.4*   < > 2.4* 2.5* 2.6*   < > = values in this interval not displayed.   No results for input(s): "LIPASE", "AMYLASE" in the last 168 hours. CBC: Recent Labs  Lab 05/16/22 0213 05/17/22 0321 05/18/22 0400 05/19/22 0305 05/20/22 0341 05/21/22 0149  WBC 14.7* 15.0* 16.4* 18.8* 21.5* 38.8*  NEUTROABS  8.3* 8.7*  --   --   --  28.6*  HGB 7.8* 8.6* 9.2* 8.4* 8.6* 9.2*  HCT 23.4* 25.4* 27.9* 25.8* 25.8* 26.9*  MCV 85.7 83.6 86.6 86.0 83.8 85.1  PLT 477* 563* 570* 601* 592* 535*   Blood Culture    Component Value Date/Time   SDES BLOOD BLOOD LEFT HAND 05/20/2022 1505   SPECREQUEST  05/20/2022 1505    BOTTLES DRAWN AEROBIC AND ANAEROBIC Blood Culture adequate volume   CULT  05/20/2022 1505    NO GROWTH < 24 HOURS Performed at [facility], [place] [address] [place],  [address]    REPTSTATUS PENDING 05/20/2022 1505    Cardiac Enzymes: No results for input(s): "CKTOTAL", "CKMB", "CKMBINDEX", "TROPONINI" in the last 168 hours. CBG: Recent Labs  Lab 05/20/22 0759 05/20/22 1324 05/20/22 1719 05/20/22 2117 05/21/22 0754  GLUCAP 192* 245* 275* 286* 262*   Iron Studies: No results for input(s): "IRON", "TIBC", "TRANSFERRIN", "FERRITIN" in the last 72 hours. Lab Results  Component  Value Date   INR 1.2 05/10/2022   INR 1.1 04/13/2022   INR 1.0 04/14/2021   Studies/Results: Overnight EEG with video  Result Date: 05/20/2022 Carolyn Havens, MD     05/21/2022  7:30 AM Patient Name: Carolyn Russo MRN: [id number] Epilepsy Attending: Lora Russo Referring Physician/Provider: [name], MD Duration: 05/19/2022 2050 to 05/20/2022 1607  Patient history: 71yo F with h/o seizures now with ams. EEG to evaluate for seizure.  Level of alertness:  lethargic  AEDs during EEG study: LEV  Technical aspects: This EEG study was done with scalp electrodes positioned according to the 10-20 International system of electrode placement. Electrical activity was reviewed with band pass filter of 1-'70Hz'$ , sensitivity of 7 uV/mm, display speed of 92m/sec with a '60Hz'$  notched filter applied as appropriate. EEG data were recorded continuously and digitally stored.  Video monitoring was available and reviewed as appropriate.  Description: EEG showed continuous generalized 3 to 6 Hz theta-delta  slowing. Generalized periodic discharges with triphasic morphology at  1.5-2.'5Hz'$  were noted intermittently, more prominent when awake/stimulated. Hyperventilation and photic stimulation were not performed.    ABNORMALITY - Periodic discharges with triphasic morphology, generalized ( GPDs) - Continuous slow, generalized  IMPRESSION: This study showed generalized periodic discharges with triphasic morphology which can be on the ictal-interictal continuum.  However, the morphology, frequency and reactivity to stimulation is more likely indicative of toxic-metabolic causes. Additionally there is moderate to severe diffuse encephalopathy, nonspecific etiology. No seizures were seen throughout the recording.  Carolyn Russo  EEG adult  Result Date: 05/19/2022 YLora Havens MD     05/20/2022  8:19 AM Patient Name: Carolyn PONTIFFMRN: [id number] Attending: PLora HavensReferring Physician/Provider: BHetty Blend Russo Date: 05/19/2022 Duration: 23.04 mins Patient history: 89yoF with h/o seizures now with ams. EEG to evaluate for seizure. Level of alertness:  lethargic AEDs during EEG study: LEV Technical aspects: This EEG study was done with scalp electrodes positioned according to the 10-20 International system of electrode placement. Electrical activity was reviewed with band pass filter of 1-'70Hz'$ , sensitivity of 7 uV/mm, display speed of 353msec with a '60Hz'$  notched filter applied as appropriate. EEG data were recorded continuously and digitally stored.  Video monitoring was available and reviewed as appropriate. Description: EEG showed continuous generalized 3 to 6 Hz theta-delta slowing. Generalized periodic discharges with triphasic morphology at  1.5-2.'5Hz'$  were noted intermittently. Hyperventilation and photic stimulation were not performed.   ABNORMALITY - Periodic discharges with triphasic morphology, generalized ( GPDs) - Continuous slow, generalized IMPRESSION: This study showed  generalized periodic discharges with triphasic morphology  which can be on the ictal-interictal continuum. Recommend long term monitoring for further evaluation. Additionally there is moderate to severe diffuse encephalopathy, nonspecific etiology. No seizures were seen throughout the recording. Priyanka O Barbra Sarks  Medications:  sodium chloride     ferric gluconate (FERRLECIT) IVPB 125 mg (05/20/22 1400)   magnesium sulfate bolus IVPB      (feeding supplement) PROSource Plus  30 mL Oral BID BM   acetaminophen  1,000 mg Oral Q8H   vitamin C  1,000 mg Oral Daily   carvedilol  3.125 mg Oral BID WC   Chlorhexidine Gluconate Cloth  6 each Topical Q0600   clopidogrel  75 mg Oral Daily   darbepoetin (ARANESP) injection - DIALYSIS  200 mcg Intravenous Q Fri-HD   docusate sodium  100 mg Oral Daily   feeding supplement (NEPRO CARB STEADY)  237 mL Oral BID BM   heparin injection (subcutaneous)  5,000 Units Subcutaneous Q8H   insulin aspart  0-6 Units Subcutaneous TID WC   insulin glargine-yfgn  4 Units Subcutaneous QHS   levETIRAcetam  500 mg Oral QHS   losartan  100 mg Oral Daily   methylphenidate  5 mg Oral Daily   mometasone-formoterol  2 puff Inhalation BID   nutrition supplement (JUVEN)  1 packet Oral BID BM   pantoprazole  40 mg Oral Daily   rosuvastatin  20 mg Oral Daily   sevelamer carbonate  0.8 g Oral TID WC   zinc sulfate  220 mg Oral Daily    Dialysis Orders: MWF - [facility] 4 hrs 180NRE 350/700 88.5 kg 2.0 K/2.0 Ca UFP 4  -No heparin -Mircera 200 mcg  IV q 2wks - last 04/29/22 -Venofer '100mg'$  IV X 5 doses-new order, hasn't been given yet -Hectorol 36mg IV TIW   Assessment/Plan: 1. Gangrene R Foot. VVS consulted. S/P R BKA 05/18/2022 per Dr. DSharol Given Per primary. Cefepime d/c'd 7/26 and Vanc d/c'd 7/27-per Dr. DSharol Given no further need for ABX. 2. ESRD -MWF. Next HD 7/31.  3. Anemia - HGB 9.2. Expect drop in HGB post op. ABX stopped. Fe load started today Continue weekly  ESA.  4. Secondary hyperparathyroidism - Continue binders, Corrected Ca+ 10.4. Decrease VDRA dose.  5. HTN/volume - Continue home meds, losartan recently increased 05/18/2022, Bps improved. Will need lower EDW on discharge.  6. Nutrition - Very low albumin. Add protein supplements.  7. DMT2-per rpimary 8. H/O CVA-ASA, plavix, Keppra  CTobie Poet NP C[facility]7/29/2023,11:40 AM  LOS: 11 days

## 2022-05-21 NOTE — Progress Notes (Signed)
Patient ID: Carolyn Russo, female   DOB: 31-Jan-1951, 71 y.o.   MRN: [id number] Patient is status post transtibial amputation.  She is much more alert and conversant than she was prior to surgery.  No drainage in the wound VAC canister.  Anticipate discharge to skilled nursing with the Praveena plus portable wound VAC pump.

## 2022-05-21 NOTE — Progress Notes (Signed)
[facility]                                                                              Carolyn Russo, is a 71 y.o. female, DOB - Feb 07, 1951, KDX:[id number] Admit date - 05/10/2022    Outpatient Primary MD for the patient is [name], MD  LOS - 11  days  Chief Complaint  Patient presents with   Cellulitis       Brief summary   Patient is a 71 year old female with ESRD on HD MWF, acute basal ganglia CVA with left-sided weakness on 06/7672 complicated by seizures, diabetes, HTN, HLD, HFrEF, PAD with associated ischemia, history of lower GI bleed, recent admission 6/23 with dry gangrene of right great toe and underwent elective surgery per Dr. Sherryle Lis of podiatry, return to ED with slight worsening of the leg despite completed 2 weeks of cefadroxil.  Patient noted to have worsening gangrenous changes to the right forefoot. Patient was admitted for further work-up  Assessment & Plan    Gangrene right foot with history of PAD, recent elective right great toe amputation -Vascular surgery and Dr. Sharol Given consulted -Underwent right below-knee amputation on 7/26 -Patient was placed on vancomycin and cefepime, discontinued cefepime on 7/26, DC'd vancomycin on 7/27 (discussed with Dr. Sharol Given, no further need of antibiotics)  Acute metabolic encephalopathy with intermittent confusion In the setting of recent CVA in 03/2021 -At baseline, alert oriented x3, per husband at the bedside mental status was " sharp" before hospitalization. - Ammonia level 11 on 7/24, B12, folate in normal limits -Chest x-ray on 7/24 did not show any infiltrates -Patient has a known history of CVA in 03/2021, had 1 episode of seizure.   -Discussed with Dr. Sharol Given on 7/26, cefepime also has known side effect of encephalopathy, cefepime was discontinued on 7/26.  He also recommended that patient does not need any further antibiotics and the margins were clear as she had the right BKA, vancomycin was  discontinued on 7/27 -Opioids were decreased however patient has not required that many doses.  -CT head negative for acute CVA  -EEG showed generalized periodic discharges, more likely indicative of toxic metabolic causes, no acute seizures.  Appreciate neurology recommendations. -Patient much more alert and oriented today, following commands, appropriately answering most questions.  Husband, Dr. [name] at the bedside also agrees with significant improvement today -Seroquel was stopped yesterday, minimize sedatives or pain meds  Leukocytosis -WBCs continues to trend up, procalcitonin 1.47, UA and culture 7/28 negative for UTI, blood cultures 7/28 negative so far  -CT head showed no new stroke.  Chest x-ray 7/24 showed no infiltrates. -Waiting for Dr. Jess Barters response regarding stump, ID consulted  ESRD on hemodialysis, MWF -HD per nephrology  Prior history of basal ganglia CVA in 03/2021 with residual left-sided weakness History of seizures -Currently no seizure activity on EEG - continue Keppra 500 mg at bedtime -Continue Plavix, Crestor    Diabetes mellitus type 2, IDDM with complications of ESRD, CVA Recent Labs    05/20/22 0759 05/20/22 1324 05/20/22 1719 05/20/22 2117 05/21/22 0754 05/21/22 1145  GLUCAP 192* 245* 275* 286* 262* 265*   -Continue  low-dose Lantus, SSI  Anemia, prior history of GI bleed -H&H stable, 9.2  Obesity Estimated body mass index is 29.17 kg/m as calculated from the following:   Height as of this encounter: '5\' 5"'$  (1.651 m).   Weight as of this encounter: 79.5 kg.  Code Status: Full CODE STATUS DVT Prophylaxis:  SCD's Start: 05/18/22 1213 heparin injection 5,000 Units Start: 05/13/22 1415 Place TED hose Start: 05/10/22 1813   Level of Care: Level of care: Med-Surg Family Communication: Updated patient's husband, Dr. [name] at the bedside today AM and on phone regarding leukocytosis  Disposition Plan:      Remains inpatient appropriate:  Somnolent, hospitalization complicated with delirium, work-up in progress   Procedures:  Right [place]  Consultants:   Nephrology Orthopedics Vascular surgery  Antimicrobials:   Anti-infectives (From admission, onward)    Start     Dose/Rate Route Frequency Ordered Stop   05/18/22 1300  ceFAZolin (ANCEF) IVPB 2g/100 mL premix  Status:  Discontinued        2 g 200 mL/hr over 30 Minutes Intravenous Every 8 hours 05/18/22 1212 05/18/22 1233   05/14/22 1130  vancomycin (VANCOCIN) IVPB 1000 mg/200 mL premix        1,000 mg 200 mL/hr over 60 Minutes Intravenous  Once 05/14/22 1038 05/14/22 1429   05/11/22 1900  ceFEPIme (MAXIPIME) 1 g in sodium chloride 0.9 % 100 mL IVPB  Status:  Discontinued        1 g 200 mL/hr over 30 Minutes Intravenous Every 24 hours 05/10/22 1715 05/18/22 1633   05/11/22 1200  vancomycin (VANCOCIN) IVPB 1000 mg/200 mL premix  Status:  Discontinued        1,000 mg 200 mL/hr over 60 Minutes Intravenous Every M-W-F (Hemodialysis) 05/10/22 1715 05/19/22 0925   05/10/22 1730  vancomycin (VANCOREADY) IVPB 1750 mg/350 mL        1,750 mg 175 mL/hr over 120 Minutes Intravenous  Once 05/10/22 1715 05/10/22 2151   05/10/22 1730  ceFEPIme (MAXIPIME) 2 g in sodium chloride 0.9 % 100 mL IVPB        2 g 200 mL/hr over 30 Minutes Intravenous  Once 05/10/22 1715 05/10/22 1840          Medications  (feeding supplement) PROSource Plus  30 mL Oral BID BM   acetaminophen  1,000 mg Oral Q8H   vitamin C  1,000 mg Oral Daily   carvedilol  3.125 mg Oral BID WC   Chlorhexidine Gluconate Cloth  6 each Topical Q0600   clopidogrel  75 mg Oral Daily   darbepoetin (ARANESP) injection - DIALYSIS  200 mcg Intravenous Q Fri-HD   docusate sodium  100 mg Oral Daily   feeding supplement (NEPRO CARB STEADY)  237 mL Oral BID BM   heparin injection (subcutaneous)  5,000 Units Subcutaneous Q8H   insulin aspart  0-6 Units Subcutaneous TID WC   insulin glargine-yfgn  4 Units Subcutaneous QHS    levETIRAcetam  500 mg Oral QHS   losartan  100 mg Oral Daily   methylphenidate  5 mg Oral Daily   mometasone-formoterol  2 puff Inhalation BID   nutrition supplement (JUVEN)  1 packet Oral BID BM   pantoprazole  40 mg Oral Daily   rosuvastatin  20 mg Oral Daily   sevelamer carbonate  0.8 g Oral TID WC   zinc sulfate  220 mg Oral Daily      Subjective:   Carolyn Russo was seen and examined today.  Much  more alert and awake today, following commands, appropriately answering most questions.  Husband at the bedside.  No nausea or vomiting, diarrhea, no fevers.  No cough, chest pain or shortness of breath, no hypoxia.  Had a small BM today  Objective:   Vitals:   05/20/22 2145 05/21/22 0501 05/21/22 0756 05/21/22 0800  BP:  (!) 147/77 139/66   Pulse:  84 87 85  Resp:   15 17  Temp:  97.7 F (36.5 C) 98.3 F (36.8 C)   TempSrc:  Oral Oral   SpO2: 100% 98% 100% 99%  Weight:      Height:        Intake/Output Summary (Last 24 hours) at 05/21/2022 1340 Last data filed at 05/21/2022 1000 Gross per 24 hour  Intake 350 ml  Output 0 ml  Net 350 ml     Wt Readings from Last 3 Encounters:  05/20/22 79.5 kg  04/15/22 81.7 kg  03/30/22 93.9 kg   Physical Exam General: Awake and alert, much more oriented today and conversing Cardiovascular: S1 S2 clear, RRR. Respiratory: CTAB, no wheezing Gastrointestinal: Soft, nontender, nondistended, NBS Ext: right BKA Neuro: no new deficits, following commands Psych: much more alert and oriented today  Data Reviewed:  I have personally reviewed following labs    CBC Lab Results  Component Value Date   WBC 38.8 (H) 05/21/2022   RBC 3.16 (L) 05/21/2022   HGB 9.2 (L) 05/21/2022   HCT 26.9 (L) 05/21/2022   MCV 85.1 05/21/2022   MCH 29.1 05/21/2022   PLT 535 (H) 05/21/2022   MCHC 34.2 05/21/2022   RDW 19.9 (H) 05/21/2022   LYMPHSABS 4.2 (H) 05/21/2022   MONOABS 5.1 (H) 05/21/2022   EOSABS 0.0 05/21/2022   BASOSABS 0.1 05/21/2022      Last metabolic panel Lab Results  Component Value Date   NA 136 05/21/2022   K 3.7 05/21/2022   CL 95 (L) 05/21/2022   CO2 28 05/21/2022   BUN 28 (H) 05/21/2022   CREATININE 5.42 (H) 05/21/2022   GLUCOSE 243 (H) 05/21/2022   GFRNONAA 8 (L) 05/21/2022   GFRAA 17 06/19/2020   CALCIUM 10.1 05/21/2022   PHOS 5.1 (H) 05/21/2022   PROT 7.2 05/15/2022   ALBUMIN 2.6 (L) 05/21/2022   LABGLOB 3.2 04/13/2022   AGRATIO 1.2 04/13/2022   BILITOT 0.5 05/15/2022   ALKPHOS 50 05/15/2022   AST 20 05/15/2022   ALT 22 05/15/2022   ANIONGAP 13 05/21/2022    CBG (last 3)  Recent Labs    05/20/22 2117 05/21/22 0754 05/21/22 1145  GLUCAP 286* 262* 265*        Carolyn Russo M.D. [facility] 05/21/2022, 1:40 PM  Available via Epic secure chat 7am-7pm After 7 pm, please refer to night coverage provider listed on amion.

## 2022-05-22 ENCOUNTER — Inpatient Hospital Stay (HOSPITAL_COMMUNITY): Payer: Medicare PPO

## 2022-05-22 DIAGNOSIS — G9340 Encephalopathy, unspecified: Secondary | ICD-10-CM

## 2022-05-22 DIAGNOSIS — L089 Local infection of the skin and subcutaneous tissue, unspecified: Secondary | ICD-10-CM | POA: Diagnosis not present

## 2022-05-22 DIAGNOSIS — R569 Unspecified convulsions: Secondary | ICD-10-CM | POA: Diagnosis not present

## 2022-05-22 DIAGNOSIS — I6381 Other cerebral infarction due to occlusion or stenosis of small artery: Secondary | ICD-10-CM | POA: Diagnosis not present

## 2022-05-22 DIAGNOSIS — S88111A Complete traumatic amputation at level between knee and ankle, right lower leg, initial encounter: Secondary | ICD-10-CM | POA: Diagnosis not present

## 2022-05-22 DIAGNOSIS — J4520 Mild intermittent asthma, uncomplicated: Secondary | ICD-10-CM | POA: Diagnosis not present

## 2022-05-22 DIAGNOSIS — I96 Gangrene, not elsewhere classified: Secondary | ICD-10-CM | POA: Diagnosis not present

## 2022-05-22 DIAGNOSIS — N184 Chronic kidney disease, stage 4 (severe): Secondary | ICD-10-CM | POA: Diagnosis not present

## 2022-05-22 DIAGNOSIS — E08311 Diabetes mellitus due to underlying condition with unspecified diabetic retinopathy with macular edema: Secondary | ICD-10-CM

## 2022-05-22 LAB — CBC WITH DIFFERENTIAL/PLATELET
Abs Immature Granulocytes: 0.3 10*3/uL — ABNORMAL HIGH (ref 0.00–0.07)
Basophils Absolute: 0 10*3/uL (ref 0.0–0.1)
Basophils Relative: 0 %
Eosinophils Absolute: 0 10*3/uL (ref 0.0–0.5)
Eosinophils Relative: 0 %
HCT: 26.2 % — ABNORMAL LOW (ref 36.0–46.0)
Hemoglobin: 8.8 g/dL — ABNORMAL LOW (ref 12.0–15.0)
Lymphocytes Relative: 8 %
Lymphs Abs: 2.5 10*3/uL (ref 0.7–4.0)
MCH: 28.5 pg (ref 26.0–34.0)
MCHC: 33.6 g/dL (ref 30.0–36.0)
MCV: 84.8 fL (ref 80.0–100.0)
Metamyelocytes Relative: 1 %
Monocytes Absolute: 0.9 10*3/uL (ref 0.1–1.0)
Monocytes Relative: 3 %
Neutro Abs: 27.8 10*3/uL — ABNORMAL HIGH (ref 1.7–7.7)
Neutrophils Relative %: 88 %
Platelets: 525 10*3/uL — ABNORMAL HIGH (ref 150–400)
RBC: 3.09 MIL/uL — ABNORMAL LOW (ref 3.87–5.11)
RDW: 19.8 % — ABNORMAL HIGH (ref 11.5–15.5)
WBC: 31.6 10*3/uL — ABNORMAL HIGH (ref 4.0–10.5)
nRBC: 0.3 % — ABNORMAL HIGH (ref 0.0–0.2)
nRBC: 1 /100 WBC — ABNORMAL HIGH

## 2022-05-22 LAB — COMPREHENSIVE METABOLIC PANEL
ALT: 61 U/L — ABNORMAL HIGH (ref 0–44)
AST: 52 U/L — ABNORMAL HIGH (ref 15–41)
Albumin: 2.5 g/dL — ABNORMAL LOW (ref 3.5–5.0)
Alkaline Phosphatase: 67 U/L (ref 38–126)
Anion gap: 17 — ABNORMAL HIGH (ref 5–15)
BUN: 61 mg/dL — ABNORMAL HIGH (ref 8–23)
CO2: 23 mmol/L (ref 22–32)
Calcium: 9.8 mg/dL (ref 8.9–10.3)
Chloride: 91 mmol/L — ABNORMAL LOW (ref 98–111)
Creatinine, Ser: 7.87 mg/dL — ABNORMAL HIGH (ref 0.44–1.00)
GFR, Estimated: 5 mL/min — ABNORMAL LOW (ref >60)
Glucose, Bld: 444 mg/dL — ABNORMAL HIGH (ref 70–99)
Potassium: 4.8 mmol/L (ref 3.5–5.1)
Sodium: 131 mmol/L — ABNORMAL LOW (ref 135–145)
Total Bilirubin: 1.1 mg/dL (ref 0.3–1.2)
Total Protein: 7.7 g/dL (ref 6.5–8.1)

## 2022-05-22 LAB — GLUCOSE, CAPILLARY
Glucose-Capillary: 421 mg/dL — ABNORMAL HIGH (ref 70–99)
Glucose-Capillary: 410 mg/dL — ABNORMAL HIGH (ref 70–99)
Glucose-Capillary: 411 mg/dL — ABNORMAL HIGH (ref 70–99)
Glucose-Capillary: 453 mg/dL — ABNORMAL HIGH (ref 70–99)
Glucose-Capillary: 506 mg/dL (ref 70–99)

## 2022-05-22 LAB — PHOSPHORUS: Phosphorus: 6.5 mg/dL — ABNORMAL HIGH (ref 2.5–4.6)

## 2022-05-22 MED ORDER — INSULIN ASPART 100 UNIT/ML IJ SOLN
0.0000 [IU] | Freq: Three times a day (TID) | INTRAMUSCULAR | Status: DC
  Administered 2022-05-22: 5 [IU] via SUBCUTANEOUS
  Administered 2022-05-22: 10 [IU] via SUBCUTANEOUS
  Administered 2022-05-22 – 2022-05-23 (×3): 9 [IU] via SUBCUTANEOUS
  Administered 2022-05-24: 3 [IU] via SUBCUTANEOUS
  Administered 2022-05-24: 2 [IU] via SUBCUTANEOUS
  Administered 2022-05-24: 3 [IU] via SUBCUTANEOUS
  Administered 2022-05-25: 5 [IU] via SUBCUTANEOUS
  Administered 2022-05-25: 3 [IU] via SUBCUTANEOUS
  Administered 2022-05-26: 2 [IU] via SUBCUTANEOUS
  Administered 2022-05-26: 3 [IU] via SUBCUTANEOUS
  Administered 2022-05-26: 2 [IU] via SUBCUTANEOUS
  Administered 2022-05-28: 7 [IU] via SUBCUTANEOUS
  Administered 2022-05-28: 5 [IU] via SUBCUTANEOUS
  Administered 2022-05-29: 2 [IU] via SUBCUTANEOUS
  Administered 2022-05-29: 1 [IU] via SUBCUTANEOUS
  Administered 2022-05-29: 3 [IU] via SUBCUTANEOUS
  Administered 2022-05-30: 1 [IU] via SUBCUTANEOUS
  Administered 2022-06-01: 2 [IU] via SUBCUTANEOUS
  Administered 2022-06-02: 3 [IU] via SUBCUTANEOUS
  Administered 2022-06-04: 2 [IU] via SUBCUTANEOUS
  Administered 2022-06-04: 1 [IU] via SUBCUTANEOUS
  Administered 2022-06-04 – 2022-06-05 (×2): 2 [IU] via SUBCUTANEOUS
  Administered 2022-06-05 (×2): 1 [IU] via SUBCUTANEOUS
  Administered 2022-06-06: 3 [IU] via SUBCUTANEOUS
  Administered 2022-06-06: 1 [IU] via SUBCUTANEOUS
  Administered 2022-06-06: 3 [IU] via SUBCUTANEOUS
  Administered 2022-06-07: 1 [IU] via SUBCUTANEOUS
  Administered 2022-06-07 – 2022-06-08 (×2): 2 [IU] via SUBCUTANEOUS
  Administered 2022-06-09: 3 [IU] via SUBCUTANEOUS
  Administered 2022-06-09: 2 [IU] via SUBCUTANEOUS
  Administered 2022-06-09: 1 [IU] via SUBCUTANEOUS
  Administered 2022-06-10: 2 [IU] via SUBCUTANEOUS
  Administered 2022-06-10: 1 [IU] via SUBCUTANEOUS

## 2022-05-22 MED ORDER — INSULIN GLARGINE-YFGN 100 UNIT/ML ~~LOC~~ SOLN
6.0000 [IU] | Freq: Every day | SUBCUTANEOUS | Status: DC
  Administered 2022-05-22: 6 [IU] via SUBCUTANEOUS
  Filled 2022-05-22 (×3): qty 0.06

## 2022-05-22 MED ORDER — CHLORHEXIDINE GLUCONATE CLOTH 2 % EX PADS
6.0000 | MEDICATED_PAD | Freq: Every day | CUTANEOUS | Status: DC
Start: 2022-05-22 — End: 2022-05-24
  Administered 2022-05-22 – 2022-05-24 (×3): 6 via TOPICAL

## 2022-05-22 MED ORDER — INSULIN ASPART 100 UNIT/ML IJ SOLN
10.0000 [IU] | Freq: Once | INTRAMUSCULAR | Status: AC
  Administered 2022-05-22: 10 [IU] via SUBCUTANEOUS

## 2022-05-22 MED ORDER — ALTEPLASE 2 MG IJ SOLR
2.0000 mg | Freq: Once | INTRAMUSCULAR | Status: AC | PRN
  Administered 2022-05-23: 2 mg
  Filled 2022-05-22: qty 2

## 2022-05-22 MED ORDER — LIDOCAINE-PRILOCAINE 2.5-2.5 % EX CREA: 1.0000 | TOPICAL_CREAM | CUTANEOUS | Status: DC | PRN

## 2022-05-22 MED ORDER — PENTAFLUOROPROP-TETRAFLUOROETH EX AERO: 1.0000 | INHALATION_SPRAY | CUTANEOUS | Status: DC | PRN

## 2022-05-22 MED ORDER — LIDOCAINE HCL (PF) 1 % IJ SOLN: 5.0000 mL | INTRAMUSCULAR | Status: DC | PRN

## 2022-05-22 MED ORDER — IOHEXOL 300 MG/ML  SOLN
100.0000 mL | Freq: Once | INTRAMUSCULAR | Status: AC | PRN
  Administered 2022-05-22: 100 mL via INTRAVENOUS

## 2022-05-22 NOTE — Progress Notes (Signed)
Neurology Progress Note   S:// Patient is asleep in the bed in NAD. No family at the bedside. She very sleepy. She will awaken with continuous stimuli, but drifts back to sleep. She is oriented x4, following commands and moving all extremities. CBG is 421 this am   O:// Current vital signs: BP (!) 147/61 (BP Location: Left Arm)   Pulse 95   Temp 98.6 F (37 C) (Oral)   Resp 18   Ht '5\' 5"'$  (1.651 m)   Wt 79.5 kg   SpO2 98%   BMI 29.17 kg/m  Vital signs in last 24 hours: Temp:  [98.6 F (37 C)-99 F (37.2 C)] 98.6 F (37 C) (07/30 0508) Pulse Rate:  [74-95] 95 (07/30 0700) Resp:  [18-20] 18 (07/30 0700) BP: (134-147)/(55-61) 147/61 (07/30 0508) SpO2:  [97 %-99 %] 98 % (07/30 0821)  GENERAL: very drowsy elderly female in NAD HEENT: - Normocephalic and atraumatic, dry mm LUNGS - Clear to auscultation bilaterally with no wheezes CV - S1S2 RRR, no m/r/g, equal pulses bilaterally. ABDOMEN - Soft, nontender, nondistended with normoactive BS Ext: warm, well perfused, intact peripheral pulses, no edema NEURO:  Mental Status: AA&Ox4 self, age, month, date and year and place. Very drowsy but awakens after repeated stimuli. Able to follow commands Language: speech is appears clear. Cranial Nerves: PERRL 2 mm/brisk. EOM with right gaze, visual fields no blink to threat on the left, no facial asymmetry, facial sensation intact, hearing intact, tongue/uvula/soft palate midline, normal sternocleidomastoid and trapezius muscle strength. No evidence of tongue atrophy or fibrillations Motor:moves all extremities spontaneously, left arm 4/5 with drift and right stump withdraws to pain Tone: is normal and bulk is normal Sensation- Intact to light touch bilaterally Coordination: no ataxia Gait- deferred    Medications  Current Facility-Administered Medications:    (feeding supplement) PROSource Plus liquid 30 mL, 30 mL, Oral, BID BM, Newt Minion, MD, 30 mL at 05/21/22 2006   0.9 %   sodium chloride infusion, , Intravenous, Continuous, Newt Minion, MD   acetaminophen (TYLENOL) tablet 1,000 mg, 1,000 mg, Oral, Q8H, Newt Minion, MD, 1,000 mg at 05/22/22 1610   acetaminophen (TYLENOL) tablet 325-650 mg, 325-650 mg, Oral, Q6H PRN, Newt Minion, MD   albuterol (PROVENTIL) (2.5 MG/3ML) 0.083% nebulizer solution 2.5 mg, 2.5 mg, Nebulization, Q2H PRN, Newt Minion, MD   alum & mag hydroxide-simeth (MAALOX/MYLANTA) 200-200-20 MG/5ML suspension 15-30 mL, 15-30 mL, Oral, Q2H PRN, Newt Minion, MD, 30 mL at 05/21/22 0103   ascorbic acid (VITAMIN C) tablet 1,000 mg, 1,000 mg, Oral, Daily, Newt Minion, MD, 1,000 mg at 05/21/22 1040   bisacodyl (DULCOLAX) EC tablet 5 mg, 5 mg, Oral, Daily PRN, Newt Minion, MD   carvedilol (COREG) tablet 3.125 mg, 3.125 mg, Oral, BID WC, Newt Minion, MD, 3.125 mg at 05/21/22 1741   Chlorhexidine Gluconate Cloth 2 % PADS 6 each, 6 each, Topical, Q0600, Newt Minion, MD, 6 each at 05/22/22 0606   clopidogrel (PLAVIX) tablet 75 mg, 75 mg, Oral, Daily, Rai, Ripudeep K, MD, 75 mg at 05/21/22 1042   Darbepoetin Alfa (ARANESP) injection 200 mcg, 200 mcg, Intravenous, Q Fri-HD, Newt Minion, MD, 200 mcg at 05/20/22 1158   docusate sodium (COLACE) capsule 100 mg, 100 mg, Oral, Daily, Newt Minion, MD, 100 mg at 05/21/22 1041   feeding supplement (NEPRO CARB STEADY) liquid 237 mL, 237 mL, Oral, BID BM, Newt Minion, MD, 237 mL at 05/20/22  1351   ferric gluconate (FERRLECIT) 125 mg in sodium chloride 0.9 % 100 mL IVPB, 125 mg, Intravenous, Q M,W,F-HD, Tobie Poet E, NP, Last Rate: 110 mL/hr at 05/20/22 1400, 125 mg at 05/20/22 1400   fluticasone (FLONASE) 50 MCG/ACT nasal spray 2 spray, 2 spray, Each Nare, Daily PRN, Newt Minion, MD   guaiFENesin-dextromethorphan (ROBITUSSIN DM) 100-10 MG/5ML syrup 15 mL, 15 mL, Oral, Q4H PRN, Newt Minion, MD   heparin injection 5,000 Units, 5,000 Units, Subcutaneous, Q8H, Newt Minion, MD, 5,000  Units at 05/22/22 1610   hydrALAZINE (APRESOLINE) injection 5 mg, 5 mg, Intravenous, Q20 Min PRN, Newt Minion, MD   hydrALAZINE (APRESOLINE) tablet 10 mg, 10 mg, Oral, Q8H PRN, Newt Minion, MD   hydrocortisone (ANUSOL-HC) 2.5 % rectal cream, , Rectal, Daily PRN, Rise Patience, MD, Given at 05/21/22 2230   insulin aspart (novoLOG) injection 0-9 Units, 0-9 Units, Subcutaneous, TID WC, Rai, Ripudeep K, MD   insulin glargine-yfgn (SEMGLEE) injection 4 Units, 4 Units, Subcutaneous, QHS, Newt Minion, MD, 4 Units at 05/21/22 2059   labetalol (NORMODYNE) injection 10 mg, 10 mg, Intravenous, Q10 min PRN, Newt Minion, MD   levETIRAcetam (KEPPRA) tablet 500 mg, 500 mg, Oral, QHS, Newt Minion, MD, 500 mg at 05/21/22 2007   losartan (COZAAR) tablet 100 mg, 100 mg, Oral, Daily, Penninger, Lindsay, PA, 100 mg at 05/21/22 1037   magnesium sulfate IVPB 2 g 50 mL, 2 g, Intravenous, Daily PRN, Newt Minion, MD   methylphenidate (RITALIN) tablet 5 mg, 5 mg, Oral, Daily, Newt Minion, MD, 5 mg at 05/21/22 1043   metoprolol tartrate (LOPRESSOR) injection 2-5 mg, 2-5 mg, Intravenous, Q2H PRN, Newt Minion, MD   mometasone-formoterol [facility]) 200-5 MCG/ACT inhaler 2 puff, 2 puff, Inhalation, BID, Newt Minion, MD, 2 puff at 05/22/22 0820   morphine (PF) 2 MG/ML injection 0.5-1 mg, 0.5-1 mg, Intravenous, Q4H PRN, Rai, Ripudeep K, MD   nutrition supplement (JUVEN) (JUVEN) powder packet 1 packet, 1 packet, Oral, BID BM, Newt Minion, MD, 1 packet at 05/21/22 1043   ondansetron (ZOFRAN) injection 4 mg, 4 mg, Intravenous, Q6H PRN, Newt Minion, MD   ondansetron [facility]) tablet 8 mg, 8 mg, Oral, BID PRN, Newt Minion, MD, 8 mg at 05/14/22 1754   pantoprazole (PROTONIX) EC tablet 40 mg, 40 mg, Oral, Daily, Newt Minion, MD, 40 mg at 05/21/22 1042   phenol (CHLORASEPTIC) mouth spray 1 spray, 1 spray, Mouth/Throat, PRN, Newt Minion, MD   polyethylene glycol (MIRALAX / GLYCOLAX) packet 17  g, 17 g, Oral, Daily PRN, Newt Minion, MD   potassium chloride SA (KLOR-CON M) CR tablet 20-40 mEq, 20-40 mEq, Oral, Daily PRN, Newt Minion, MD   rosuvastatin (CRESTOR) tablet 20 mg, 20 mg, Oral, Daily, Newt Minion, MD, 20 mg at 05/21/22 1039   senna-docusate (Senokot-S) tablet 1-2 tablet, 1-2 tablet, Oral, Daily PRN, Newt Minion, MD   sevelamer carbonate (RENVELA) packet 0.8 g, 0.8 g, Oral, TID WC, Newt Minion, MD, 0.8 g at 05/21/22 1742   witch hazel-glycerin (TUCKS) pad, , Topical, PRN, Rai, Ripudeep K, MD   zinc sulfate capsule 220 mg, 220 mg, Oral, Daily, Newt Minion, MD, 220 mg at 05/21/22 1041 Labs CBC    Component Value Date/Time   WBC 31.6 (H) 05/22/2022 0204   RBC 3.09 (L) 05/22/2022 0204   HGB 8.8 (L) 05/22/2022 0204   HGB 9.3 (  L) 04/13/2022 1512   HCT 26.2 (L) 05/22/2022 0204   HCT 27.6 (L) 04/13/2022 1512   PLT 525 (H) 05/22/2022 0204   PLT 341 04/13/2022 1512   MCV 84.8 05/22/2022 0204   MCV 89 04/13/2022 1512   MCH 28.5 05/22/2022 0204   MCHC 33.6 05/22/2022 0204   RDW 19.8 (H) 05/22/2022 0204   RDW 16.1 (H) 04/13/2022 1512   LYMPHSABS 2.5 05/22/2022 0204   LYMPHSABS 2.4 04/13/2022 1512   MONOABS 0.9 05/22/2022 0204   EOSABS 0.0 05/22/2022 0204   EOSABS 0.4 04/13/2022 1512   BASOSABS 0.0 05/22/2022 0204   BASOSABS 0.1 04/13/2022 1512    CMP     Component Value Date/Time   NA 131 (L) 05/22/2022 0204   NA 142 04/13/2022 1512   K 4.8 05/22/2022 0204   CL 91 (L) 05/22/2022 0204   CO2 23 05/22/2022 0204   GLUCOSE 444 (H) 05/22/2022 0204   BUN 61 (H) 05/22/2022 0204   BUN 12 04/13/2022 1512   CREATININE 7.87 (H) 05/22/2022 0204   CREATININE 0.84 10/13/2011 0903   CALCIUM 9.8 05/22/2022 0204   PROT 7.7 05/22/2022 0204   PROT 7.1 04/13/2022 1512   ALBUMIN 2.5 (L) 05/22/2022 0204   ALBUMIN 3.9 04/13/2022 1512   AST 52 (H) 05/22/2022 0204   ALT 61 (H) 05/22/2022 0204   ALKPHOS 67 05/22/2022 0204   BILITOT 1.1 05/22/2022 0204   BILITOT 0.3  04/13/2022 1512   GFRNONAA 5 (L) 05/22/2022 0204   GFRNONAA 75 10/13/2011 0903   GFRAA 17 06/19/2020 0000   GFRAA 86 10/13/2011 0903    glycosylated hemoglobin  Lipid Panel     Component Value Date/Time   CHOL 96 (L) 04/13/2022 1526   TRIG 130 04/13/2022 1526   HDL 34 (L) 04/13/2022 1526   CHOLHDL 2.8 04/13/2022 1526   CHOLHDL 2.9 04/16/2021 1840   VLDL 18 04/16/2021 1840   LDLCALC 39 04/13/2022 1526     Imaging I have reviewed images in epic and the results pertinent to this consultation are:  CT-scan of the brain 7/27 No acute intracranial abnormality. Evidence of prior infarct, atrophy and chronic microvascular ischemic change without change.  rEEG 7/27- This study showed generalized periodic discharges with triphasic morphology  which can be on the ictal-interictal continuum. Recommend long term monitoring for further evaluation. Additionally there is moderate to severe diffuse encephalopathy, nonspecific etiology. No seizures were seen throughout the recording.  LTM 7/27 2050 to 7/28 2536 -This study showed generalized periodic discharges with triphasic morphology which can be on the ictal-interictal continuum.  However, the morphology, frequency and reactivity to stimulation is more likely indicative of toxic-metabolic causes. Additionally there is moderate to severe diffuse encephalopathy, nonspecific etiology. No seizures were seen throughout the recording  LABS: WBC 31.6 Plt 525 CBG 421 NA 131 BUN 61 Cr 7.87 AST 52 ALT 61  Assessment:  Carolyn Russo is a 71 y.o. AA female with extensive and complicated PMH as above and neurologic history significant for 2 CVAs involving right subcortical and right basal ganglia as above and seizure 2022 with recent admission 6/23 with dry gangrene of right great toe and underwent elective surgery per Dr. Sherryle Lis of podiatry. Patient then developed worsening progression of necrosis to the right forefoot for which amputation was  recommended. Underwent right below-knee amputation yesterday on 7/26 with Dr. Sharol Given. Now with persistent encephalopathy and without focal neurologic deficit which may be concerning for seizure activity.   Impression: Acute metabolic encephalopathy  likely multifactorial due to critical illness and hospitalization, s/p general anesthesia, worsening leukocytosis, metabolic derangements, hospital associated delirium  Recommendations: -Delirium precautions - Seizure precautions - correct metabolic derangements - Continue Keppra daily  - Continue to assess for infectious source - Limit sedating medications as able  - Neurology will sign off. Please call with questions or concerns   Beulah Gandy DNP, ACNPC-AG

## 2022-05-22 NOTE — Progress Notes (Signed)
     Subjective: 4 Days Post-Op Procedure(s) (LRB): RIGHT BELOW KNEE AMPUTATION (Right) Awake, alert. Right BKA immediate post op knee extension brace with VAC intactl Patient reports pain as moderate.    Objective:   VITALS:  Temp:  [98 F (36.7 C)-99 F (37.2 C)] 98 F (36.7 C) (07/30 0850) Pulse Rate:  [74-95] 83 (07/30 0850) Resp:  [18-20] 20 (07/30 0850) BP: (134-171)/(55-70) 171/70 (07/30 0850) SpO2:  [97 %-99 %] 99 % (07/30 0850)  ABD soft Incision: dressing C/D/I and VAC is intact and drawing negative pressure.    LABS Recent Labs    05/20/22 0341 05/21/22 0149 05/22/22 0204  HGB 8.6* 9.2* 8.8*  WBC 21.5* 38.8* 31.6*  PLT 592* 535* 525*   Recent Labs    05/21/22 0149 05/22/22 0204  NA 136 131*  K 3.7 4.8  CL 95* 91*  CO2 28 23  BUN 28* 61*  CREATININE 5.42* 7.87*  GLUCOSE 243* 444*   No results for input(s): "LABPT", "INR" in the last 72 hours.   Assessment/Plan: 4 Days Post-Op Procedure(s) (LRB): RIGHT BELOW KNEE AMPUTATION (Right)  Advance diet Up with therapy Continue ABX therapy due to Post-op infection Discharge to SNF  Carolyn Russo 05/22/2022, 12:03 PM Patient ID: Carolyn Russo, female   DOB: [birth year], 71 y.o.   MRN: [id number]

## 2022-05-22 NOTE — Progress Notes (Cosign Needed Addendum)
[facility] Progress Note   Subjective:    Seen and examined patient at bedside. Appears more confused compared to yesterday. Opens eyes to voice and responds to simple questions- "yes" or "no" and goes quickly back to sleep. Noted ID consulted for ongoing Leukocytosis. Remains on RA. Plan for HD 7/31.  Objective Vitals:   05/22/22 0508 05/22/22 0700 05/22/22 0821 05/22/22 0850  BP: (!) 147/61   (!) 171/70  Pulse: 85 95  83  Resp: '20 18  20  '$ Temp: 98.6 F (37 C)   98 F (36.7 C)  TempSrc: Oral     SpO2: 97% 98% 98% 99%  Weight:      Height:       Physical Exam General: Chronically ill-appearing; on RA; NAD Neuro: Mildly confused. Opens eyes to voice but quickly goes back to sleep, answers simple questions Heart: RRR; No M/R/G; S1 and S2 Lungs: Clear anteriorly and diminished laterally Abdomen: Soft and non-tender Extremities: R BKA-stump protector in place; no edema LLE Dialysis Access: RIJ [facility]   Filed Weights   05/20/22 0925 05/20/22 0941 05/20/22 1300  Weight: 80.7 kg 80.7 kg 79.5 kg    Intake/Output Summary (Last 24 hours) at 05/22/2022 1028 Last data filed at 05/21/2022 1357 Gross per 24 hour  Intake --  Output 150 ml  Net -150 ml    Additional Objective Labs: Basic Metabolic Panel: Recent Labs  Lab 05/20/22 0341 05/21/22 0149 05/22/22 0204  NA 133* 136 131*  K 3.9 3.7 4.8  CL 94* 95* 91*  CO2 '24 28 23  '$ GLUCOSE 190* 243* 444*  BUN 26* 28* 61*  CREATININE 5.58* 5.42* 7.87*  CALCIUM 9.5 10.1 9.8  PHOS 4.4 5.1* 6.5*   Liver Function Tests: Recent Labs  Lab 05/20/22 0341 05/21/22 0149 05/22/22 0204  AST  --   --  52*  ALT  --   --  61*  ALKPHOS  --   --  67  BILITOT  --   --  1.1  PROT  --   --  7.7  ALBUMIN 2.5* 2.6* 2.5*   No results for input(s): "LIPASE", "AMYLASE" in the last 168 hours. CBC: Recent Labs  Lab 05/17/22 0321 05/18/22 0400 05/19/22 0305 05/20/22 0341 05/21/22 0149 05/22/22 0204  WBC 15.0* 16.4* 18.8*  21.5* 38.8* 31.6*  NEUTROABS 8.7*  --   --   --  28.6* 27.8*  HGB 8.6* 9.2* 8.4* 8.6* 9.2* 8.8*  HCT 25.4* 27.9* 25.8* 25.8* 26.9* 26.2*  MCV 83.6 86.6 86.0 83.8 85.1 84.8  PLT 563* 570* 601* 592* 535* 525*   Blood Culture    Component Value Date/Time   SDES BLOOD BLOOD LEFT HAND 05/20/2022 1505   SPECREQUEST  05/20/2022 1505    BOTTLES DRAWN AEROBIC AND ANAEROBIC Blood Culture adequate volume   CULT  05/20/2022 1505    NO GROWTH 2 DAYS Performed at [facility], [place] [address] [place], [place] [address]    REPTSTATUS PENDING 05/20/2022 1505    Cardiac Enzymes: No results for input(s): "CKTOTAL", "CKMB", "CKMBINDEX", "TROPONINI" in the last 168 hours. CBG: Recent Labs  Lab 05/21/22 0754 05/21/22 1145 05/21/22 1642 05/21/22 1946 05/22/22 0846  GLUCAP 262* 265* 259* 227* 421*   Iron Studies: No results for input(s): "IRON", "TIBC", "TRANSFERRIN", "FERRITIN" in the last 72 hours. Lab Results  Component Value Date   INR 1.2 05/10/2022   INR 1.1 04/13/2022   INR 1.0 04/14/2021   Studies/Results: CT CHEST ABDOMEN PELVIS  W CONTRAST  Result Date: 05/22/2022 CLINICAL DATA:  71 year old female with sepsis.  Recent BKA. EXAM: CT CHEST, ABDOMEN, AND PELVIS WITH CONTRAST TECHNIQUE: Multidetector CT imaging of the chest, abdomen and pelvis was performed following the standard protocol during bolus administration of intravenous contrast. RADIATION DOSE REDUCTION: This exam was performed according to the departmental dose-optimization program which includes automated exposure control, adjustment of the mA and/or kV according to patient size and/or use of iterative reconstruction technique. CONTRAST:  153m OMNIPAQUE IOHEXOL 300 MG/ML  SOLN COMPARISON:  05/16/2022 chest radiograph, 04/17/2022 abdomen and pelvis CT. FINDINGS: CT CHEST FINDINGS Cardiovascular: Cardiomegaly identified. LEFT coronary artery stent versus atherosclerotic calcifications noted. Mild aortic atherosclerotic  calcifications noted without aneurysm or pericardial effusion. A RIGHT central venous catheter is noted with tip within the LOWER RIGHT atrium. Mediastinum/Nodes: No enlarged mediastinal, hilar, or axillary lymph nodes. Thyroid gland, trachea, and esophagus demonstrate no significant findings. Lungs/Pleura: There is no evidence of airspace disease, consolidation, mass, suspicious nodule, pleural effusion or pneumothorax. Mild dependent and bibasilar atelectasis/scarring noted. Musculoskeletal: No acute or suspicious bony abnormalities are noted. Thoracic scoliosis again identified. CT ABDOMEN PELVIS FINDINGS Hepatobiliary: The liver and gallbladder are unremarkable. There is no evidence of intrahepatic or extrahepatic biliary dilatation. Pancreas: Unremarkable Spleen: Unremarkable Adrenals/Urinary Tract: Mildly atrophic kidneys noted. There is no evidence of renal mass or hydronephrosis. The adrenal glands and bladder are unremarkable. Stomach/Bowel: A moderate hiatal hernia is noted. There is no evidence of bowel obstruction, definite bowel wall thickening or inflammatory changes. The appendix is not identified but there is no evidence of pericecal inflammation. Vascular/Lymphatic: Aortic atherosclerosis. No enlarged abdominal or pelvic lymph nodes. Reproductive: Calcified fibroids are again noted. No other significant abnormality identified. Other: No ascites, focal collection or pneumoperitoneum. A small umbilical hernia containing fat is again noted. Musculoskeletal: No acute or suspicious bony abnormalities are noted. Lumbar spine levoscoliosis and degenerative changes again noted. IMPRESSION: 1. No evidence of acute abnormality within the chest, abdomen or pelvis. 2. Cardiomegaly and coronary artery disease. 3. Moderate hiatal hernia. 4. Small umbilical hernia containing fat. 5. Aortic Atherosclerosis (ICD10-I70.0). Electronically Signed   By: JMargarette CanadaM.D.   On: 05/22/2022 09:46    Medications:  sodium  chloride     ferric gluconate (FERRLECIT) IVPB 125 mg (05/20/22 1400)   magnesium sulfate bolus IVPB      (feeding supplement) PROSource Plus  30 mL Oral BID BM   acetaminophen  1,000 mg Oral Q8H   vitamin C  1,000 mg Oral Daily   carvedilol  3.125 mg Oral BID WC   Chlorhexidine Gluconate Cloth  6 each Topical Q0600   clopidogrel  75 mg Oral Daily   darbepoetin (ARANESP) injection - DIALYSIS  200 mcg Intravenous Q Fri-HD   docusate sodium  100 mg Oral Daily   feeding supplement (NEPRO CARB STEADY)  237 mL Oral BID BM   heparin injection (subcutaneous)  5,000 Units Subcutaneous Q8H   insulin aspart  0-9 Units Subcutaneous TID WC   insulin glargine-yfgn  6 Units Subcutaneous QHS   levETIRAcetam  500 mg Oral QHS   losartan  100 mg Oral Daily   methylphenidate  5 mg Oral Daily   mometasone-formoterol  2 puff Inhalation BID   nutrition supplement (JUVEN)  1 packet Oral BID BM   pantoprazole  40 mg Oral Daily   rosuvastatin  20 mg Oral Daily   sevelamer carbonate  0.8 g Oral TID WC   zinc sulfate  220 mg Oral  Daily    Dialysis Orders: MWF - [facility] 4 hrs 180NRE 350/700 88.5 kg 2.0 K/2.0 Ca UFP 4  -No heparin -Mircera 200 mcg  IV q 2wks - last 04/29/22 -Venofer '100mg'$  IV X 5 doses-new order, hasn't been given yet -Hectorol 29mg IV TIW  Assessment/Plan: 1. Gangrene R Foot. VVS consulted. S/P R BKA 05/18/2022 per Dr. DSharol Given Per primary. Cefepime d/c'd 7/26 and Vanc d/c'd 7/27-per Dr. DSharol Given no further need for ABX. 2. Acute Metabolic Encephalopathy/Intermittent Confusion-A&O X 3 at baseline. More confused during this hospitalization. Seroquel stopped, minimize sedatives. CT head (-) CVA. Cefepime has a known side effect encephalopathy so this d/c'd 7/26. Managed by primary. 3. Leukocytosis-ID now following. W/u CT negative for infection (she was pre-medicated d/t documented allergy to contrast). Awaiting Dr. DSharol Givento reassess wound since wound vac is applied. Blood cx 7/28 (-)  X 2 days-final report pending. 4. ESRD -MWF. Next HD 7/31.  5. Anemia - HGB 9.2. Expect drop in HGB post op. ABX stopped. Fe load started today Continue weekly ESA.  6. Secondary hyperparathyroidism - Continue binders, Corrected Ca+ 10.4. Decrease VDRA dose.  7. HTN/volume - Continue home meds, losartan recently increased 05/18/2022, Bps improved. Will need lower EDW on discharge.  8. Nutrition - Very low albumin. Add protein supplements.  9. DMT2-per rpimary 10. H/O CVA-ASA, plavix, Keppra  CTobie Poet NP [place] Bend7/30/2023,10:28 AM  LOS: 12 days

## 2022-05-22 NOTE — Progress Notes (Signed)
Patient's Blood sugar is 410. Notified MD.

## 2022-05-22 NOTE — Progress Notes (Signed)
Patient's blood sugar 453. Notified MD, new order to give 10 units Humalog received.

## 2022-05-22 NOTE — Progress Notes (Signed)
Patient blood sugar is 421. Notified Md, new order received.

## 2022-05-22 NOTE — Progress Notes (Signed)
[facility]                                                                              Carolyn Russo, is a 71 y.o. female, DOB - 01-20-1951, DPO:[id number] Admit date - 05/10/2022    Outpatient Primary MD for the patient is [name], MD  LOS - 12  days  Chief Complaint  Patient presents with   Cellulitis       Brief summary   Patient is a 71 year old female with ESRD on HD MWF, acute basal ganglia CVA with left-sided weakness on 01/3153 complicated by seizures, diabetes, HTN, HLD, HFrEF, PAD with associated ischemia, history of lower GI bleed, recent admission 6/23 with dry gangrene of right great toe and underwent elective surgery per Dr. Sherryle Lis of podiatry, return to ED with slight worsening of the leg despite completed 2 weeks of cefadroxil.  Patient noted to have worsening gangrenous changes to the right forefoot. Patient was admitted for further work-up  Assessment & Plan    Gangrene right foot with history of PAD, recent elective right great toe amputation -Vascular surgery and Dr. Sharol Given consulted -Underwent right below-knee amputation on 7/26 -Patient was placed on vancomycin and cefepime, discontinued cefepime on 7/26, DC'd vancomycin on 7/27 (discussed with Dr. Sharol Given, no further need of antibiotics)  Acute metabolic encephalopathy with intermittent confusion In the setting of recent CVA in 03/2021 -At baseline, alert oriented x3, per husband at the bedside mental status was " sharp" before hospitalization. - Ammonia level 11 on 7/24, B12, folate in normal limits -Chest x-ray on 7/24 did not show any infiltrates -Patient has a known history of CVA in 03/2021, had 1 episode of seizure.   -Discussed with Dr. Sharol Given on 7/26, cefepime also has known side effect of encephalopathy, cefepime was discontinued on 7/26.  He also recommended that patient does not need any further antibiotics and the margins were clear as she had the right BKA, vancomycin was  discontinued on 7/27 -Opioids were decreased however patient has not required that many doses.  -CT head negative for acute CVA  -EEG showed generalized periodic discharges, more likely indicative of toxic metabolic causes, no acute seizures.  Appreciate neurology recommendations. -Minimize sedatives and pain medications  Leukocytosis -procalcitonin 1.47, UA and culture 7/28 negative for UTI, blood cultures 7/28 negative so far  -CT head showed no new stroke.  Chest x-ray 7/24 showed no infiltrates. -WBC count trending down, however patient received steroid prophylaxis for the CTs  with contrast -CT chest abdomen pelvis unrevealing for any infection Awaiting orthopedics assessment of the stump.  ESRD on hemodialysis, MWF -HD per nephrology  Prior history of basal ganglia CVA in 03/2021 with residual left-sided weakness History of seizures -Currently no seizure activity on EEG - continue Keppra 500 mg at bedtime -Continue Plavix, Crestor    Diabetes mellitus type 2, IDDM with complications of ESRD, CVA Recent Labs    05/21/22 1145 05/21/22 1642 05/21/22 1946 05/22/22 0846 05/22/22 1206 05/22/22 1254  GLUCAP 265* 259* 227* 421* 410* 411*  -Hyperglycemia secondary to steroids received yesterday for the CT with contrast -will cautiously give NovoLog as high  risk of hypoglycemia due to insulin stacking and ESRD -Continue to monitor closely, increase to sliding scale insulin, increased Semglee to 6 units  Anemia, prior history of GI bleed -H&H stable  Obesity Estimated body mass index is 29.17 kg/m as calculated from the following:   Height as of this encounter: '5\' 5"'$  (1.651 m).   Weight as of this encounter: 79.5 kg.  Code Status: Full CODE STATUS DVT Prophylaxis:  SCD's Start: 05/18/22 1213 heparin injection 5,000 Units Start: 05/13/22 1415 Place TED hose Start: 05/10/22 1813   Level of Care: Level of care: Med-Surg Family Communication: Updated patient's husband, Dr.  [name] at the bedside on 7/29  Disposition Plan:      Remains inpatient appropriate: Work-up in progress  Procedures:  Right BKA  Consultants:   Nephrology Orthopedics Vascular surgery  Antimicrobials:   Anti-infectives (From admission, onward)    Start     Dose/Rate Route Frequency Ordered Stop   05/18/22 1300  ceFAZolin (ANCEF) IVPB 2g/100 mL premix  Status:  Discontinued        2 g 200 mL/hr over 30 Minutes Intravenous Every 8 hours 05/18/22 1212 05/18/22 1233   05/14/22 1130  vancomycin (VANCOCIN) IVPB 1000 mg/200 mL premix        1,000 mg 200 mL/hr over 60 Minutes Intravenous  Once 05/14/22 1038 05/14/22 1429   05/11/22 1900  ceFEPIme (MAXIPIME) 1 g in sodium chloride 0.9 % 100 mL IVPB  Status:  Discontinued        1 g 200 mL/hr over 30 Minutes Intravenous Every 24 hours 05/10/22 1715 05/18/22 1633   05/11/22 1200  vancomycin (VANCOCIN) IVPB 1000 mg/200 mL premix  Status:  Discontinued        1,000 mg 200 mL/hr over 60 Minutes Intravenous Every M-W-F (Hemodialysis) 05/10/22 1715 05/19/22 0925   05/10/22 1730  vancomycin (VANCOREADY) IVPB 1750 mg/350 mL        1,750 mg 175 mL/hr over 120 Minutes Intravenous  Once 05/10/22 1715 05/10/22 2151   05/10/22 1730  ceFEPIme (MAXIPIME) 2 g in sodium chloride 0.9 % 100 mL IVPB        2 g 200 mL/hr over 30 Minutes Intravenous  Once 05/10/22 1715 05/10/22 1840          Medications  (feeding supplement) PROSource Plus  30 mL Oral BID BM   acetaminophen  1,000 mg Oral Q8H   vitamin C  1,000 mg Oral Daily   carvedilol  3.125 mg Oral BID WC   Chlorhexidine Gluconate Cloth  6 each Topical Q0600   Chlorhexidine Gluconate Cloth  6 each Topical Q0600   clopidogrel  75 mg Oral Daily   darbepoetin (ARANESP) injection - DIALYSIS  200 mcg Intravenous Q Fri-HD   docusate sodium  100 mg Oral Daily   feeding supplement (NEPRO CARB STEADY)  237 mL Oral BID BM   heparin injection (subcutaneous)  5,000 Units Subcutaneous Q8H   insulin  aspart  0-9 Units Subcutaneous TID WC   insulin glargine-yfgn  6 Units Subcutaneous QHS   levETIRAcetam  500 mg Oral QHS   losartan  100 mg Oral Daily   methylphenidate  5 mg Oral Daily   mometasone-formoterol  2 puff Inhalation BID   nutrition supplement (JUVEN)  1 packet Oral BID BM   pantoprazole  40 mg Oral Daily   rosuvastatin  20 mg Oral Daily   sevelamer carbonate  0.8 g Oral TID WC   zinc sulfate  220 mg Oral  Daily      Subjective:   Carolyn Russo was seen and examined today AM.  No fevers or chills, somnolent but arousable.  No acute issues overnight.  Hyperglycemia with CBGs in 400s due to steroids.   Objective:   Vitals:   05/22/22 0508 05/22/22 0700 05/22/22 0821 05/22/22 0850  BP: (!) 147/61   (!) 171/70  Pulse: 85 95  83  Resp: '20 18  20  '$ Temp: 98.6 F (37 C)   98 F (36.7 C)  TempSrc: Oral     SpO2: 97% 98% 98% 99%  Weight:      Height:        Intake/Output Summary (Last 24 hours) at 05/22/2022 1316 Last data filed at 05/21/2022 1357 Gross per 24 hour  Intake --  Output 150 ml  Net -150 ml     Wt Readings from Last 3 Encounters:  05/20/22 79.5 kg  04/15/22 81.7 kg  03/30/22 93.9 kg   Physical Exam General: Somnolent but easily arousable Cardiovascular: S1 S2 clear, RRR. Respiratory: CTAB, no wheezing Gastrointestinal: Soft, nontender, nondistended, NBS Ext: right BKA, vacuum dressing in place Neuro: no new deficits Psych: somnolent but arousable  Data Reviewed:  I have personally reviewed following labs    CBC Lab Results  Component Value Date   WBC 31.6 (H) 05/22/2022   RBC 3.09 (L) 05/22/2022   HGB 8.8 (L) 05/22/2022   HCT 26.2 (L) 05/22/2022   MCV 84.8 05/22/2022   MCH 28.5 05/22/2022   PLT 525 (H) 05/22/2022   MCHC 33.6 05/22/2022   RDW 19.8 (H) 05/22/2022   LYMPHSABS 2.5 05/22/2022   MONOABS 0.9 05/22/2022   EOSABS 0.0 05/22/2022   BASOSABS 0.0 41/66/0630     Last metabolic panel Lab Results  Component Value Date    NA 131 (L) 05/22/2022   K 4.8 05/22/2022   CL 91 (L) 05/22/2022   CO2 23 05/22/2022   BUN 61 (H) 05/22/2022   CREATININE 7.87 (H) 05/22/2022   GLUCOSE 444 (H) 05/22/2022   GFRNONAA 5 (L) 05/22/2022   GFRAA 17 06/19/2020   CALCIUM 9.8 05/22/2022   PHOS 6.5 (H) 05/22/2022   PROT 7.7 05/22/2022   ALBUMIN 2.5 (L) 05/22/2022   LABGLOB 3.2 04/13/2022   AGRATIO 1.2 04/13/2022   BILITOT 1.1 05/22/2022   ALKPHOS 67 05/22/2022   AST 52 (H) 05/22/2022   ALT 61 (H) 05/22/2022   ANIONGAP 17 (H) 05/22/2022    CBG (last 3)  Recent Labs    05/22/22 0846 05/22/22 1206 05/22/22 1254  GLUCAP 421* 410* 411*        Keya Wynes M.D. [facility] 05/22/2022, 1:16 PM  Available via Epic secure chat 7am-7pm After 7 pm, please refer to night coverage provider listed on amion.

## 2022-05-22 NOTE — Progress Notes (Addendum)
Subjective:  She is thirsty, she also was muttering "I do not want to die" when I came into the room   Antibiotics:  Anti-infectives (From admission, onward)    Start     Dose/Rate Route Frequency Ordered Stop   05/18/22 1300  ceFAZolin (ANCEF) IVPB 2g/100 mL premix  Status:  Discontinued        2 g 200 mL/hr over 30 Minutes Intravenous Every 8 hours 05/18/22 1212 05/18/22 1233   05/14/22 1130  vancomycin (VANCOCIN) IVPB 1000 mg/200 mL premix        1,000 mg 200 mL/hr over 60 Minutes Intravenous  Once 05/14/22 1038 05/14/22 1429   05/11/22 1900  ceFEPIme (MAXIPIME) 1 g in sodium chloride 0.9 % 100 mL IVPB  Status:  Discontinued        1 g 200 mL/hr over 30 Minutes Intravenous Every 24 hours 05/10/22 1715 05/18/22 1633   05/11/22 1200  vancomycin (VANCOCIN) IVPB 1000 mg/200 mL premix  Status:  Discontinued        1,000 mg 200 mL/hr over 60 Minutes Intravenous Every M-W-F (Hemodialysis) 05/10/22 1715 05/19/22 0925   05/10/22 1730  vancomycin (VANCOREADY) IVPB 1750 mg/350 mL        1,750 mg 175 mL/hr over 120 Minutes Intravenous  Once 05/10/22 1715 05/10/22 2151   05/10/22 1730  ceFEPIme (MAXIPIME) 2 g in sodium chloride 0.9 % 100 mL IVPB        2 g 200 mL/hr over 30 Minutes Intravenous  Once 05/10/22 1715 05/10/22 1840       Medications: Scheduled Meds:  (feeding supplement) PROSource Plus  30 mL Oral BID BM   acetaminophen  1,000 mg Oral Q8H   vitamin C  1,000 mg Oral Daily   carvedilol  3.125 mg Oral BID WC   Chlorhexidine Gluconate Cloth  6 each Topical Q0600   clopidogrel  75 mg Oral Daily   darbepoetin (ARANESP) injection - DIALYSIS  200 mcg Intravenous Q Fri-HD   docusate sodium  100 mg Oral Daily   feeding supplement (NEPRO CARB STEADY)  237 mL Oral BID BM   heparin injection (subcutaneous)  5,000 Units Subcutaneous Q8H   insulin aspart  0-9 Units Subcutaneous TID WC   insulin glargine-yfgn  6 Units Subcutaneous QHS   levETIRAcetam  500 mg Oral QHS    losartan  100 mg Oral Daily   methylphenidate  5 mg Oral Daily   mometasone-formoterol  2 puff Inhalation BID   nutrition supplement (JUVEN)  1 packet Oral BID BM   pantoprazole  40 mg Oral Daily   rosuvastatin  20 mg Oral Daily   sevelamer carbonate  0.8 g Oral TID WC   zinc sulfate  220 mg Oral Daily   Continuous Infusions:  sodium chloride     ferric gluconate (FERRLECIT) IVPB 125 mg (05/20/22 1400)   magnesium sulfate bolus IVPB     PRN Meds:.acetaminophen, albuterol, alum & mag hydroxide-simeth, bisacodyl, fluticasone, guaiFENesin-dextromethorphan, hydrALAZINE, hydrALAZINE, hydrocortisone, labetalol, magnesium sulfate bolus IVPB, metoprolol tartrate, morphine injection, ondansetron, ondansetron, phenol, polyethylene glycol, potassium chloride, senna-docusate, witch hazel-glycerin    Objective: Weight change:   Intake/Output Summary (Last 24 hours) at 05/22/2022 1040 Last data filed at 05/21/2022 1357 Gross per 24 hour  Intake --  Output 150 ml  Net -150 ml   Blood pressure (!) 171/70, pulse 83, temperature 98 F (36.7 C), resp. rate 20, height '5\' 5"'$  (1.651 m), weight 79.5 kg, SpO2 99 %.  Temp:  [98 F (36.7 C)-99 F (37.2 C)] 98 F (36.7 C) (07/30 0850) Pulse Rate:  [74-95] 83 (07/30 0850) Resp:  [18-20] 20 (07/30 0850) BP: (134-171)/(55-70) 171/70 (07/30 0850) SpO2:  [97 %-99 %] 99 % (07/30 0850)  Physical Exam: Physical Exam Constitutional:      General: She is not in acute distress.    Appearance: She is well-developed. She is not diaphoretic.  HENT:     Head: Normocephalic and atraumatic.     Right Ear: External ear normal.     Left Ear: External ear normal.     Mouth/Throat:     Pharynx: No oropharyngeal exudate.  Eyes:     General: No scleral icterus.    Conjunctiva/sclera: Conjunctivae normal.     Pupils: Pupils are equal, round, and reactive to light.  Cardiovascular:     Rate and Rhythm: Normal rate and regular rhythm.     Heart sounds: Normal heart  sounds. No murmur heard.    No friction rub. No gallop.  Pulmonary:     Effort: Pulmonary effort is normal. No respiratory distress.     Breath sounds: Normal breath sounds. No wheezing or rales.  Abdominal:     General: Bowel sounds are normal. There is no distension.     Palpations: Abdomen is soft.     Tenderness: There is no abdominal tenderness. There is no rebound.  Musculoskeletal:        General: No tenderness. Normal range of motion.  Lymphadenopathy:     Cervical: No cervical adenopathy.  Skin:    General: Skin is warm and dry.     Coloration: Skin is not pale.     Findings: No erythema or rash.  Neurological:     Mental Status: She is alert. She is disoriented.     Comments: LUE weakness  Psychiatric:        Attention and Perception: She is inattentive.        Mood and Affect: Mood is anxious.        Speech: Speech is delayed.        Behavior: Behavior is cooperative.        Cognition and Memory: Cognition is impaired. Memory is impaired. She exhibits impaired recent memory and impaired remote memory.      CBC:    BMET Recent Labs    05/21/22 0149 05/22/22 0204  NA 136 131*  K 3.7 4.8  CL 95* 91*  CO2 28 23  GLUCOSE 243* 444*  BUN 28* 61*  CREATININE 5.42* 7.87*  CALCIUM 10.1 9.8     Liver Panel  Recent Labs    05/21/22 0149 05/22/22 0204  PROT  --  7.7  ALBUMIN 2.6* 2.5*  AST  --  52*  ALT  --  61*  ALKPHOS  --  67  BILITOT  --  1.1       Sedimentation Rate No results for input(s): "ESRSEDRATE" in the last 72 hours. C-Reactive Protein No results for input(s): "CRP" in the last 72 hours.  Micro Results: Recent Results (from the past 720 hour(s))  Culture, blood (Routine x 2)     Status: None   Collection Time: 05/10/22  2:42 PM   Specimen: BLOOD  Result Value Ref Range Status   Specimen Description BLOOD SITE NOT SPECIFIED  Final   Special Requests   Final    BOTTLES DRAWN AEROBIC ONLY Blood Culture results may not be optimal  due to an inadequate volume of  blood received in culture bottles   Culture   Final    NO GROWTH 5 DAYS Performed at [facility], [place] [address] [place], [place] [address]    Report Status 05/15/2022 FINAL  Final  Culture, blood (Routine x 2)     Status: None   Collection Time: 05/10/22  3:46 PM   Specimen: BLOOD  Result Value Ref Range Status   Specimen Description BLOOD RIGHT ANTECUBITAL  Final   Special Requests   Final    BOTTLES DRAWN AEROBIC AND ANAEROBIC Blood Culture adequate volume   Culture   Final    NO GROWTH 5 DAYS Performed at [facility], [place] [address] [place], [place] [address]    Report Status 05/15/2022 FINAL  Final  Culture, blood (Routine X 2) w Reflex to ID Panel     Status: None   Collection Time: 05/16/22  1:18 PM   Specimen: BLOOD  Result Value Ref Range Status   Specimen Description BLOOD LEFT ANTECUBITAL  Final   Special Requests   Final    BOTTLES DRAWN AEROBIC AND ANAEROBIC Blood Culture adequate volume   Culture   Final    NO GROWTH 5 DAYS Performed at [facility], [place] [address] [place], [place] [address]    Report Status 05/21/2022 FINAL  Final  Culture, blood (Routine X 2) w Reflex to ID Panel     Status: None   Collection Time: 05/16/22  1:27 PM   Specimen: BLOOD  Result Value Ref Range Status   Specimen Description BLOOD BLOOD LEFT HAND  Final   Special Requests   Final    BOTTLES DRAWN AEROBIC AND ANAEROBIC Blood Culture results may not be optimal due to an inadequate volume of blood received in culture bottles   Culture   Final    NO GROWTH 5 DAYS Performed at [facility], [place] [address], [place], [place] [address]    Report Status 05/21/2022 FINAL  Final  Surgical pcr screen     Status: None   Collection Time: 05/17/22  6:45 PM   Specimen: Nasal Mucosa; Nasal Swab  Result Value Ref Range Status   MRSA, PCR NEGATIVE NEGATIVE Final   Staphylococcus aureus NEGATIVE NEGATIVE Final    Comment: (NOTE) The  Xpert SA Assay (FDA approved for NASAL specimens in patients 3 years of age and older), is one component of a comprehensive surveillance program. It is not intended to diagnose infection nor to guide or monitor treatment. Performed at [facility], [place] [address] [place],  [address]   Urine Culture     Status: None   Collection Time: 05/20/22  2:13 PM   Specimen: In/Out Cath Urine  Result Value Ref Range Status   Specimen Description IN/OUT CATH URINE  Final   Special Requests NONE  Final   Culture   Final    NO GROWTH Performed at [facility], [place] [address], [place],  [address]    Report Status 05/21/2022 FINAL  Final  Culture, blood (Routine X 2) w Reflex to ID Panel     Status: None (Preliminary result)   Collection Time: 05/20/22  2:55 PM   Specimen: BLOOD  Result Value Ref Range Status   Specimen Description BLOOD BLOOD LEFT HAND  Final   Special Requests   Final    BOTTLES DRAWN AEROBIC AND ANAEROBIC Blood Culture adequate volume   Culture   Final  NO GROWTH 2 DAYS Performed at [facility], [place] [address], [place], [place] [address]    Report Status PENDING  Incomplete  Culture, blood (Routine X 2) w Reflex to ID Panel     Status: None (Preliminary result)   Collection Time: 05/20/22  3:05 PM   Specimen: BLOOD  Result Value Ref Range Status   Specimen Description BLOOD BLOOD LEFT HAND  Final   Special Requests   Final    BOTTLES DRAWN AEROBIC AND ANAEROBIC Blood Culture adequate volume   Culture   Final    NO GROWTH 2 DAYS Performed at [facility], [place] [address], [place], [place] [address]    Report Status PENDING  Incomplete    Studies/Results: CT CHEST ABDOMEN PELVIS W CONTRAST  Result Date: 05/22/2022 CLINICAL DATA:  71 year old female with sepsis.  Recent BKA. EXAM: CT CHEST, ABDOMEN, AND PELVIS WITH CONTRAST TECHNIQUE: Multidetector CT imaging of the chest, abdomen and pelvis was performed following the standard  protocol during bolus administration of intravenous contrast. RADIATION DOSE REDUCTION: This exam was performed according to the departmental dose-optimization program which includes automated exposure control, adjustment of the mA and/or kV according to patient size and/or use of iterative reconstruction technique. CONTRAST:  188m OMNIPAQUE IOHEXOL 300 MG/ML  SOLN COMPARISON:  05/16/2022 chest radiograph, 04/17/2022 abdomen and pelvis CT. FINDINGS: CT CHEST FINDINGS Cardiovascular: Cardiomegaly identified. LEFT coronary artery stent versus atherosclerotic calcifications noted. Mild aortic atherosclerotic calcifications noted without aneurysm or pericardial effusion. A RIGHT central venous catheter is noted with tip within the LOWER RIGHT atrium. Mediastinum/Nodes: No enlarged mediastinal, hilar, or axillary lymph nodes. Thyroid gland, trachea, and esophagus demonstrate no significant findings. Lungs/Pleura: There is no evidence of airspace disease, consolidation, mass, suspicious nodule, pleural effusion or pneumothorax. Mild dependent and bibasilar atelectasis/scarring noted. Musculoskeletal: No acute or suspicious bony abnormalities are noted. Thoracic scoliosis again identified. CT ABDOMEN PELVIS FINDINGS Hepatobiliary: The liver and gallbladder are unremarkable. There is no evidence of intrahepatic or extrahepatic biliary dilatation. Pancreas: Unremarkable Spleen: Unremarkable Adrenals/Urinary Tract: Mildly atrophic kidneys noted. There is no evidence of renal mass or hydronephrosis. The adrenal glands and bladder are unremarkable. Stomach/Bowel: A moderate hiatal hernia is noted. There is no evidence of bowel obstruction, definite bowel wall thickening or inflammatory changes. The appendix is not identified but there is no evidence of pericecal inflammation. Vascular/Lymphatic: Aortic atherosclerosis. No enlarged abdominal or pelvic lymph nodes. Reproductive: Calcified fibroids are again noted. No other  significant abnormality identified. Other: No ascites, focal collection or pneumoperitoneum. A small umbilical hernia containing fat is again noted. Musculoskeletal: No acute or suspicious bony abnormalities are noted. Lumbar spine levoscoliosis and degenerative changes again noted. IMPRESSION: 1. No evidence of acute abnormality within the chest, abdomen or pelvis. 2. Cardiomegaly and coronary artery disease. 3. Moderate hiatal hernia. 4. Small umbilical hernia containing fat. 5. Aortic Atherosclerosis (ICD10-I70.0). Electronically Signed   By: JMargarette CanadaM.D.   On: 05/22/2022 09:46      Assessment/Plan:  INTERVAL HISTORY: CT chest abdomen pelvis completely unrevealing   Principal Problem:   Foot infection Active Problems:   Hypertensive disorder   Type 2 diabetes mellitus (HCC)   Dyslipidemia (high LDL; low HDL)   Obesity (BMI 30-39.9)   CAD (coronary artery disease), native coronary artery   Asthma   Anemia in chronic kidney disease   Diabetic retinopathy ([place]   Basal ganglia infarction ([facility]   ESRD on dialysis ([facility]   Gangrene of right foot ([facility]  PAD (peripheral artery disease) (HCC)   Below-knee amputation of right lower extremity (HCC)   Leukocytosis    Sreshta F Citron is a 71 y.o. female with multiple medical problems including basal ganglia stroke with left-sided weakness seizures diabetes mellitus hypertension hyperlipidemia peripheral artery tree disease end-stage renal disease hemodialysis who had ischemic necrotic toe with gangrene he was admitted and now status post transtibial amputation by Dr. Sharol Given.  I was consulted with regards to her leukocytosis which climbed to 38,000 yesterday.  #1 leukocytosis: I expect this is all stress reaction from having had a necrotic infection and having undergone transtibial amputation.  Work-up with CT of chest abdomen pelvis is completely unrevealing for any evidence of infection the site in the chest and the abdomen.  We will  await Dr. Sharol Given reassessing the wound which currently has a vacuum dressing in place.  Leukocytosis actually improved overnight despite the fact that she was given steroid premedication prior to her contrast exams and will be getting a second dose of prednisone at 50 mg.  #2 necrotic ischemic digit status post transtibial amputation no indication for antibiotics at this point in time.   I spent 36 minutes with the patient including than 50% of the time in face to face counseling of the patient re her leukocytosis workup personally reviewing chest abdomen pelvis along with review of medical records in preparation for the visit and during the visit and in coordination of her care.   New team will take over tomorrow.     LOS: 12 days   Alcide Evener 05/22/2022, 10:40 AM

## 2022-05-22 NOTE — Progress Notes (Signed)
Patient's Blood sugar is 411. Notified MD. New order to give 5 units Humalog given.

## 2022-05-23 DIAGNOSIS — S88111A Complete traumatic amputation at level between knee and ankle, right lower leg, initial encounter: Secondary | ICD-10-CM | POA: Diagnosis not present

## 2022-05-23 DIAGNOSIS — G9340 Encephalopathy, unspecified: Secondary | ICD-10-CM

## 2022-05-23 DIAGNOSIS — I2510 Atherosclerotic heart disease of native coronary artery without angina pectoris: Secondary | ICD-10-CM | POA: Diagnosis not present

## 2022-05-23 DIAGNOSIS — N184 Chronic kidney disease, stage 4 (severe): Secondary | ICD-10-CM | POA: Diagnosis not present

## 2022-05-23 DIAGNOSIS — Z992 Dependence on renal dialysis: Secondary | ICD-10-CM | POA: Diagnosis not present

## 2022-05-23 DIAGNOSIS — E1122 Type 2 diabetes mellitus with diabetic chronic kidney disease: Secondary | ICD-10-CM | POA: Diagnosis not present

## 2022-05-23 DIAGNOSIS — I6381 Other cerebral infarction due to occlusion or stenosis of small artery: Secondary | ICD-10-CM | POA: Diagnosis not present

## 2022-05-23 DIAGNOSIS — N186 End stage renal disease: Secondary | ICD-10-CM | POA: Diagnosis not present

## 2022-05-23 LAB — BASIC METABOLIC PANEL
Anion gap: 18 — ABNORMAL HIGH (ref 5–15)
BUN: 94 mg/dL — ABNORMAL HIGH (ref 8–23)
CO2: 23 mmol/L (ref 22–32)
Calcium: 10.3 mg/dL (ref 8.9–10.3)
Chloride: 89 mmol/L — ABNORMAL LOW (ref 98–111)
Creatinine, Ser: 9.94 mg/dL — ABNORMAL HIGH (ref 0.44–1.00)
GFR, Estimated: 4 mL/min — ABNORMAL LOW (ref >60)
Glucose, Bld: 492 mg/dL — ABNORMAL HIGH (ref 70–99)
Potassium: 4.8 mmol/L (ref 3.5–5.1)
Sodium: 130 mmol/L — ABNORMAL LOW (ref 135–145)

## 2022-05-23 LAB — GLUCOSE, CAPILLARY
Glucose-Capillary: 497 mg/dL — ABNORMAL HIGH (ref 70–99)
Glucose-Capillary: 429 mg/dL — ABNORMAL HIGH (ref 70–99)
Glucose-Capillary: 448 mg/dL — ABNORMAL HIGH (ref 70–99)
Glucose-Capillary: 407 mg/dL — ABNORMAL HIGH (ref 70–99)
Glucose-Capillary: 418 mg/dL — ABNORMAL HIGH (ref 70–99)
Glucose-Capillary: 367 mg/dL — ABNORMAL HIGH (ref 70–99)
Glucose-Capillary: 361 mg/dL — ABNORMAL HIGH (ref 70–99)
Glucose-Capillary: 143 mg/dL — ABNORMAL HIGH (ref 70–99)

## 2022-05-23 LAB — CBC WITH DIFFERENTIAL/PLATELET
Abs Immature Granulocytes: 0.84 10*3/uL — ABNORMAL HIGH (ref 0.00–0.07)
Basophils Absolute: 0.1 10*3/uL (ref 0.0–0.1)
Basophils Relative: 0 %
Eosinophils Absolute: 0 10*3/uL (ref 0.0–0.5)
Eosinophils Relative: 0 %
HCT: 25.3 % — ABNORMAL LOW (ref 36.0–46.0)
Hemoglobin: 8.7 g/dL — ABNORMAL LOW (ref 12.0–15.0)
Immature Granulocytes: 3 %
Lymphocytes Relative: 8 %
Lymphs Abs: 2.7 10*3/uL (ref 0.7–4.0)
MCH: 29.1 pg (ref 26.0–34.0)
MCHC: 34.4 g/dL (ref 30.0–36.0)
MCV: 84.6 fL (ref 80.0–100.0)
Monocytes Absolute: 2.2 10*3/uL — ABNORMAL HIGH (ref 0.1–1.0)
Monocytes Relative: 7 %
Neutro Abs: 26.3 10*3/uL — ABNORMAL HIGH (ref 1.7–7.7)
Neutrophils Relative %: 82 %
Platelets: 521 10*3/uL — ABNORMAL HIGH (ref 150–400)
RBC: 2.99 MIL/uL — ABNORMAL LOW (ref 3.87–5.11)
RDW: 19.9 % — ABNORMAL HIGH (ref 11.5–15.5)
WBC: 32 10*3/uL — ABNORMAL HIGH (ref 4.0–10.5)
nRBC: 1.2 % — ABNORMAL HIGH (ref 0.0–0.2)

## 2022-05-23 LAB — RENAL FUNCTION PANEL
Albumin: 2.5 g/dL — ABNORMAL LOW (ref 3.5–5.0)
Anion gap: 18 — ABNORMAL HIGH (ref 5–15)
BUN: 90 mg/dL — ABNORMAL HIGH (ref 8–23)
CO2: 22 mmol/L (ref 22–32)
Calcium: 9.9 mg/dL (ref 8.9–10.3)
Chloride: 88 mmol/L — ABNORMAL LOW (ref 98–111)
Creatinine, Ser: 9.65 mg/dL — ABNORMAL HIGH (ref 0.44–1.00)
GFR, Estimated: 4 mL/min — ABNORMAL LOW (ref >60)
Glucose, Bld: 551 mg/dL (ref 70–99)
Phosphorus: 5.4 mg/dL — ABNORMAL HIGH (ref 2.5–4.6)
Potassium: 5.1 mmol/L (ref 3.5–5.1)
Sodium: 128 mmol/L — ABNORMAL LOW (ref 135–145)

## 2022-05-23 LAB — BETA-HYDROXYBUTYRIC ACID: Beta-Hydroxybutyric Acid: 0.16 mmol/L (ref 0.05–0.27)

## 2022-05-23 MED ORDER — INSULIN ASPART 100 UNIT/ML IJ SOLN
3.0000 [IU] | Freq: Three times a day (TID) | INTRAMUSCULAR | Status: DC
Start: 2022-05-23 — End: 2022-06-01
  Administered 2022-05-23 – 2022-06-01 (×15): 3 [IU] via SUBCUTANEOUS

## 2022-05-23 MED ORDER — ALTEPLASE 2 MG IJ SOLR
2.0000 mg | Freq: Once | INTRAMUSCULAR | Status: AC | PRN
  Administered 2022-05-23: 2 mg
  Filled 2022-05-23 (×3): qty 2

## 2022-05-23 MED ORDER — INSULIN ASPART 100 UNIT/ML IJ SOLN: 2.0000 [IU] | Freq: Three times a day (TID) | INTRAMUSCULAR | Status: DC

## 2022-05-23 MED ORDER — INSULIN GLARGINE-YFGN 100 UNIT/ML ~~LOC~~ SOLN
6.0000 [IU] | Freq: Two times a day (BID) | SUBCUTANEOUS | Status: DC
  Filled 2022-05-23 (×2): qty 0.06

## 2022-05-23 MED ORDER — INSULIN ASPART 100 UNIT/ML IJ SOLN
5.0000 [IU] | Freq: Once | INTRAMUSCULAR | Status: AC
  Administered 2022-05-23: 5 [IU] via SUBCUTANEOUS

## 2022-05-23 MED ORDER — INSULIN GLARGINE-YFGN 100 UNIT/ML ~~LOC~~ SOLN
8.0000 [IU] | Freq: Two times a day (BID) | SUBCUTANEOUS | Status: DC
  Administered 2022-05-23 – 2022-05-24 (×3): 8 [IU] via SUBCUTANEOUS
  Filled 2022-05-23 (×5): qty 0.08

## 2022-05-23 MED ORDER — INSULIN ASPART 100 UNIT/ML IJ SOLN
6.0000 [IU] | Freq: Once | INTRAMUSCULAR | Status: AC
  Administered 2022-05-23: 6 [IU] via SUBCUTANEOUS

## 2022-05-23 NOTE — Progress Notes (Signed)
Patient ID: Carolyn Russo, female   DOB: [birth year], 71 y.o.   MRN: [id number] Patient is status post transtibial amputation.  There is no drainage in the wound VAC canister there is a good suction fit.  Patient states that she is going to [place].  Anticipate discharge to skilled nursing.

## 2022-05-23 NOTE — Progress Notes (Addendum)
[facility] Progress Note   Subjective:   Patient seen and examined at bedside.  Resting.  Opens eyes to verbal stimuli but quickly closes and answers questions with yes or no.  Denies CP, SOB, abdominal pain and n/v/d.  Catheter not working this AM and currently dwelling TPA with plan to return to HD this afternoon.   Objective Vitals:   05/23/22 0904 05/23/22 0947 05/23/22 1100 05/23/22 1144  BP:  (!) 152/57 (!) 84/76 (!) 164/81  Pulse: 77  73   Resp:  19 18   Temp:  97.6 F (36.4 C) 97.6 F (36.4 C)   TempSrc:  Oral Oral   SpO2: 97%  100% 98%  Weight:  82.5 kg    Height:       Physical Exam General:WDWN female in NAD, resting in bed Heart:RRR, no mrg Lungs:CTAB, nml WOB on RA Abdomen:soft, NTND Extremities:no LE edema, R BKA Dialysis Access: [facility]   Filed Weights   05/20/22 0941 05/20/22 1300 05/23/22 0947  Weight: 80.7 kg 79.5 kg 82.5 kg   No intake or output data in the 24 hours ending 05/23/22 1238  Additional Objective Labs: Basic Metabolic Panel: Recent Labs  Lab 05/21/22 0149 05/22/22 0204 05/23/22 0133 05/23/22 0711  NA 136 131* 128* 130*  K 3.7 4.8 5.1 4.8  CL 95* 91* 88* 89*  CO2 '28 23 22 23  '$ GLUCOSE 243* 444* 551* 492*  BUN 28* 61* 90* 94*  CREATININE 5.42* 7.87* 9.65* 9.94*  CALCIUM 10.1 9.8 9.9 10.3  PHOS 5.1* 6.5* 5.4*  --    Liver Function Tests: Recent Labs  Lab 05/21/22 0149 05/22/22 0204 05/23/22 0133  AST  --  52*  --   ALT  --  61*  --   ALKPHOS  --  67  --   BILITOT  --  1.1  --   PROT  --  7.7  --   ALBUMIN 2.6* 2.5* 2.5*   CBC: Recent Labs  Lab 05/19/22 0305 05/20/22 0341 05/21/22 0149 05/22/22 0204 05/23/22 0133  WBC 18.8* 21.5* 38.8* 31.6* 32.0*  NEUTROABS  --   --  28.6* 27.8* 26.3*  HGB 8.4* 8.6* 9.2* 8.8* 8.7*  HCT 25.8* 25.8* 26.9* 26.2* 25.3*  MCV 86.0 83.8 85.1 84.8 84.6  PLT 601* 592* 535* 525* 521*   CBG: Recent Labs  Lab 05/23/22 0609 05/23/22 0816 05/23/22 1034 05/23/22 1057  05/23/22 1131  GLUCAP 497* 448*  429* 407* 418* 367*   Studies/Results: CT CHEST ABDOMEN PELVIS W CONTRAST  Result Date: 05/22/2022 CLINICAL DATA:  71 year old female with sepsis.  Recent BKA. EXAM: CT CHEST, ABDOMEN, AND PELVIS WITH CONTRAST TECHNIQUE: Multidetector CT imaging of the chest, abdomen and pelvis was performed following the standard protocol during bolus administration of intravenous contrast. RADIATION DOSE REDUCTION: This exam was performed according to the departmental dose-optimization program which includes automated exposure control, adjustment of the mA and/or kV according to patient size and/or use of iterative reconstruction technique. CONTRAST:  18m OMNIPAQUE IOHEXOL 300 MG/ML  SOLN COMPARISON:  05/16/2022 chest radiograph, 04/17/2022 abdomen and pelvis CT. FINDINGS: CT CHEST FINDINGS Cardiovascular: Cardiomegaly identified. LEFT coronary artery stent versus atherosclerotic calcifications noted. Mild aortic atherosclerotic calcifications noted without aneurysm or pericardial effusion. A RIGHT central venous catheter is noted with tip within the LOWER RIGHT atrium. Mediastinum/Nodes: No enlarged mediastinal, hilar, or axillary lymph nodes. Thyroid gland, trachea, and esophagus demonstrate no significant findings. Lungs/Pleura: There is no evidence of airspace disease, consolidation, mass, suspicious nodule,  pleural effusion or pneumothorax. Mild dependent and bibasilar atelectasis/scarring noted. Musculoskeletal: No acute or suspicious bony abnormalities are noted. Thoracic scoliosis again identified. CT ABDOMEN PELVIS FINDINGS Hepatobiliary: The liver and gallbladder are unremarkable. There is no evidence of intrahepatic or extrahepatic biliary dilatation. Pancreas: Unremarkable Spleen: Unremarkable Adrenals/Urinary Tract: Mildly atrophic kidneys noted. There is no evidence of renal mass or hydronephrosis. The adrenal glands and bladder are unremarkable. Stomach/Bowel: A moderate  hiatal hernia is noted. There is no evidence of bowel obstruction, definite bowel wall thickening or inflammatory changes. The appendix is not identified but there is no evidence of pericecal inflammation. Vascular/Lymphatic: Aortic atherosclerosis. No enlarged abdominal or pelvic lymph nodes. Reproductive: Calcified fibroids are again noted. No other significant abnormality identified. Other: No ascites, focal collection or pneumoperitoneum. A small umbilical hernia containing fat is again noted. Musculoskeletal: No acute or suspicious bony abnormalities are noted. Lumbar spine levoscoliosis and degenerative changes again noted. IMPRESSION: 1. No evidence of acute abnormality within the chest, abdomen or pelvis. 2. Cardiomegaly and coronary artery disease. 3. Moderate hiatal hernia. 4. Small umbilical hernia containing fat. 5. Aortic Atherosclerosis (ICD10-I70.0). Electronically Signed   By: Margarette Canada M.D.   On: 05/22/2022 09:46    Medications:  sodium chloride     ferric gluconate (FERRLECIT) IVPB 125 mg (05/20/22 1400)   magnesium sulfate bolus IVPB      (feeding supplement) PROSource Plus  30 mL Oral BID BM   acetaminophen  1,000 mg Oral Q8H   vitamin C  1,000 mg Oral Daily   carvedilol  3.125 mg Oral BID WC   Chlorhexidine Gluconate Cloth  6 each Topical Q0600   Chlorhexidine Gluconate Cloth  6 each Topical Q0600   clopidogrel  75 mg Oral Daily   darbepoetin (ARANESP) injection - DIALYSIS  200 mcg Intravenous Q Fri-HD   docusate sodium  100 mg Oral Daily   feeding supplement (NEPRO CARB STEADY)  237 mL Oral BID BM   heparin injection (subcutaneous)  5,000 Units Subcutaneous Q8H   insulin aspart  0-9 Units Subcutaneous TID WC   insulin aspart  3 Units Subcutaneous TID WC   insulin glargine-yfgn  8 Units Subcutaneous BID   levETIRAcetam  500 mg Oral QHS   losartan  100 mg Oral Daily   methylphenidate  5 mg Oral Daily   mometasone-formoterol  2 puff Inhalation BID   nutrition supplement  (JUVEN)  1 packet Oral BID BM   pantoprazole  40 mg Oral Daily   rosuvastatin  20 mg Oral Daily   sevelamer carbonate  0.8 g Oral TID WC   zinc sulfate  220 mg Oral Daily    Dialysis Orders: MWF - [place] 4 hrs 180NRE 350/700 88.5 kg 2.0 K/2.0 Ca UFP 4  -No heparin -Mircera 200 mcg  IV q 2wks - last 04/29/22 -Venofer '100mg'$  IV X 5 doses-new order, hasn't been given yet -Hectorol 44mg IV TIW   Assessment/Plan: 1. Gangrene R Foot. VVS consulted. S/P R BKA 05/18/2022 per Dr. DSharol Given Per primary. Cefepime d/c'd 7/26 and Vanc d/c'd 7/27-per Dr. DSharol Given no further need for ABX. 2. Acute Metabolic Encephalopathy/Intermittent Confusion- More confused during this hospitalization. Seroquel stopped, minimize sedatives. CT head (-) CVA. Cefepime has a known side effect encephalopathy so this d/c'd 7/26. Managed by primary. 3. Leukocytosis-ID now following - suspect stress reaction. W/u CT negative for infection. WOund vac w/no drainage per Dr. DSharol Given Blood cx 7/28 (-) X 2 days-final report pending. 4. ESRD -MWF. Next HD  7/31. Issue with TDC this AM, getting TPA dwell with plan to return this afternoon. 5. Anemia - HGB 8.7. Expect drop in HGB post op. ABX stopped. Fe load started. Continue weekly ESA.  6. Secondary hyperparathyroidism - Continue binders, Corrected Ca+ 10.4. Holding VDRA.  7. HTN/volume - BP improved. Continue home meds, losartan recently increased 05/18/2022. Continue UF as tolerated. Will need lower EDW on discharge.  8. Nutrition - Very low albumin. Add protein supplements.  9. DMT2-per rpimary 10. H/O CVA-ASA, plavix, Keppra  Jen Mow, PA-C [place] 05/23/2022,12:38 PM  LOS: 13 days   Nephrology attending: The patient was seen and examined.  Chart reviewed.  I agree with above. ESRD on HD with right foot gangrene status post right BKA, off of antibiotics.  There was problem with HD catheter today therefore tPA was used.  We will attempt to  use catheter for dialysis later today.  Discussed with the dialysis nurse.  Continue ESA, iron for anemia.  Continue binders. Hold VDRA for now.   Katheran James, [facility].

## 2022-05-23 NOTE — TOC Progression Note (Addendum)
Transition of Care [facility]) - Progression Note    Patient Details  Name: Carolyn Russo MRN: [id number] Date of Birth: 1951-02-20  Transition of Care [facility]) CM/SW [place], [place] Phone Number: 05/23/2022, 12:07 PM  Clinical Narrative:       Update-4:50pm- CSW followed up with patients son . Patients son confirmed he spoke with patients spouse who accepted SNF bed offer with [facility]. CSW called Carolyn Russo with [facility] who confirmed SNF bed for patient. Patients son interested in access [place] application for transport to HD for patient. CSW to assist with application for patient. CSW started insurance authorization for patient reference # [id number]. Insurance authorization currently pending.  CSW spoke with patients son Carolyn Russo, Who informed CSW he will relay SNF bed offers to his dad patients spouse. Patients son reports he will follow up with CSW shortly with SNF choice for patient. Patients son reports he will check with his dad patient spouse on if he will be able to provide transportation for HD for patient. CSW will continue to follow.  CSW LVM for patients spouse. CSW awaiting callback to discuss SNF bed offers. CSW will continue to follow and assist with patients dc planning needs.  Expected Discharge Plan: [place] (vs CIR vs SNF) Barriers to Discharge: Continued Medical Work up  Expected Discharge Plan and Services Expected Discharge Plan: [place] (vs CIR vs SNF)   Discharge Planning Services: CM Consult                               HH Arranged: RN, PT, Speech Therapy (PTA active with [place]) [place]: [facility] [place] Determinants of Health (SDOH) Interventions    Readmission Risk Interventions     No data to display

## 2022-05-23 NOTE — Progress Notes (Addendum)
Activase inserted by RN. Reported to floor RN advising patient will be sent back to room, to allow activate to dwell for approx 2 hours. Will bring patient this afternoon to attempt to initiate treatment. Provider also notified.

## 2022-05-23 NOTE — Plan of Care (Signed)
  Problem: Education: Goal: Ability to describe self-care measures that may prevent or decrease complications (Diabetes Survival Skills Education) will improve Outcome: Not Progressing Goal: Individualized Educational Video(s) Outcome: Not Progressing   Problem: Coping: Goal: Ability to adjust to condition or change in health will improve Outcome: Not Progressing   Problem: Fluid Volume: Goal: Ability to maintain a balanced intake and output will improve Outcome: Not Progressing   Problem: Health Behavior/Discharge Planning: Goal: Ability to identify and utilize available resources and services will improve Outcome: Not Progressing Goal: Ability to manage health-related needs will improve Outcome: Not Progressing   Problem: Metabolic: Goal: Ability to maintain appropriate glucose levels will improve Outcome: Not Progressing   Problem: Nutritional: Goal: Maintenance of adequate nutrition will improve Outcome: Not Progressing Goal: Progress toward achieving an optimal weight will improve Outcome: Not Progressing   Problem: Skin Integrity: Goal: Risk for impaired skin integrity will decrease Outcome: Not Progressing   Problem: Tissue Perfusion: Goal: Adequacy of tissue perfusion will improve Outcome: Not Progressing   Problem: Education: Goal: Knowledge of General Education information will improve Description: Including pain rating scale, medication(s)/side effects and non-pharmacologic comfort measures Outcome: Not Progressing   Problem: Health Behavior/Discharge Planning: Goal: Ability to manage health-related needs will improve Outcome: Not Progressing   Problem: Clinical Measurements: Goal: Ability to maintain clinical measurements within normal limits will improve Outcome: Not Progressing Goal: Will remain free from infection Outcome: Not Progressing Goal: Diagnostic test results will improve Outcome: Not Progressing Goal: Respiratory complications will  improve Outcome: Not Progressing Goal: Cardiovascular complication will be avoided Outcome: Not Progressing   Problem: Activity: Goal: Risk for activity intolerance will decrease Outcome: Not Progressing   Problem: Nutrition: Goal: Adequate nutrition will be maintained Outcome: Not Progressing   Problem: Coping: Goal: Level of anxiety will decrease Outcome: Not Progressing   Problem: Elimination: Goal: Will not experience complications related to bowel motility Outcome: Not Progressing Goal: Will not experience complications related to urinary retention Outcome: Not Progressing   Problem: Pain Managment: Goal: General experience of comfort will improve Outcome: Not Progressing   Problem: Safety: Goal: Ability to remain free from injury will improve Outcome: Not Progressing   Problem: Skin Integrity: Goal: Risk for impaired skin integrity will decrease Outcome: Not Progressing   Problem: Education: Goal: Knowledge of the prescribed therapeutic regimen will improve Outcome: Not Progressing Goal: Ability to verbalize activity precautions or restrictions will improve Outcome: Not Progressing Goal: Understanding of discharge needs will improve Outcome: Not Progressing   Problem: Activity: Goal: Ability to perform//tolerate increased activity and mobilize with assistive devices will improve Outcome: Not Progressing   Problem: Clinical Measurements: Goal: Postoperative complications will be avoided or minimized Outcome: Not Progressing   Problem: Self-Care: Goal: Ability to meet self-care needs will improve Outcome: Not Progressing   Problem: Self-Concept: Goal: Ability to maintain and perform role responsibilities to the fullest extent possible will improve Outcome: Not Progressing   Problem: Pain Management: Goal: Pain level will decrease with appropriate interventions Outcome: Not Progressing   Problem: Skin Integrity: Goal: Demonstration of wound healing  without infection will improve Outcome: Not Progressing

## 2022-05-23 NOTE — Progress Notes (Signed)
[facility]                                                                              Female Carolyn Russo, is a 71 y.o. female, DOB - [birth year], IWP:[id number] Admit date - 05/10/2022    Outpatient Primary MD for the patient is [name], MD  LOS - 13  days  Chief Complaint  Patient presents with   Cellulitis       Brief summary   Patient is a 71 year old female with ESRD on HD MWF, acute basal ganglia CVA with left-sided weakness on 02/538 complicated by seizures, diabetes, HTN, HLD, HFrEF, PAD with associated ischemia, history of lower GI bleed, recent admission 6/23 with dry gangrene of right great toe and underwent elective surgery per Dr. Sherryle Lis of podiatry, return to ED with slight worsening of the leg despite completed 2 weeks of cefadroxil.  Patient noted to have worsening gangrenous changes to the right forefoot. Patient was admitted for further work-up  Assessment & Plan    Gangrene right foot with history of PAD, recent elective right great toe amputation -Vascular surgery and Dr. Sharol Given consulted -Underwent right below-knee amputation on 7/26 -Patient was placed on vancomycin and cefepime, discontinued cefepime on 7/26, DC'd vancomycin on 7/27 (discussed with Dr. Sharol Given, no further need of antibiotics)  Acute metabolic encephalopathy with intermittent confusion In the setting of recent CVA in 03/2021 -At baseline, alert and oriented x3 per family.  Known history of CVA in 03/2021 with 1 episode of seizure. - Ammonia level 11 on 7/24, B12, folate in normal limits -Chest x-ray on 7/24 did not show any infiltrates -Antibiotics have been discontinued after discussing with Dr. Sharol Given -CT head negative for acute CVA  -EEG showed generalized periodic discharges, more likely indicative of toxic metabolic causes, no acute seizures.  Appreciate neurology recommendations. -Minimize sedatives and pain medications -Patient is now much more alert and oriented, getting  closer to her baseline  Leukocytosis -procalcitonin 1.47, UA and culture 7/28 negative for UTI, blood cultures 7/28 negative so far  -CT head showed no new stroke.  Chest x-ray 7/24 showed no infiltrates. -WBC count trending down, 38.8 on 7/29-> 31.6 on 7/30, received steroids for prophylaxis per CT with contrast -> 32.0, neutrophilia improving  -CT chest abdomen pelvis unrevealing for any infection -Expected leukocytosis now due to steroids.  ESRD on hemodialysis, MWF -HD per nephrology  Prior history of basal ganglia CVA in 03/2021 with residual left-sided weakness History of seizures -Currently no seizure activity on EEG - continue Keppra 500 mg at bedtime -Continue Plavix, Crestor    Diabetes mellitus type 2, IDDM with complications of ESRD, CVA Severe hyperglycemia due to steroids Recent Labs    05/22/22 2020 05/23/22 0609 05/23/22 0816 05/23/22 1034 05/23/22 1057 05/23/22 1131  GLUCAP 506* 497* 448*  429* 407* 418* 367*    -Patient has received significant amount of NovoLog with the concern for hypoglycemia when steroids wear off  -Increase Semglee to 8 units twice daily, added NovoLog meal coverage 3 units 3 times daily AC, SSI sensitive -Discussed with diabetic coordinator, plan to place on glucose stabilizer today after she finishes  hemodialysis   Anemia, prior history of GI bleed -H&H stable, hemoglobin 8.7  Obesity Estimated body mass index is 30.27 kg/m as calculated from the following:   Height as of this encounter: '5\' 5"'$  (1.651 m).   Weight as of this encounter: 82.5 kg.  Code Status: Full CODE STATUS DVT Prophylaxis:  SCD's Start: 05/18/22 1213 heparin injection 5,000 Units Start: 05/13/22 1415 Place TED hose Start: 05/10/22 1813   Level of Care: Level of care: Med-Surg Family Communication: Updated patient's husband, Dr. [name] at the bedside on 7/29  Disposition Plan:      Remains inpatient appropriate: Work-up in progress  Procedures:   Right BKA  Consultants:   Nephrology Orthopedics Vascular surgery  Antimicrobials:   Anti-infectives (From admission, onward)    Start     Dose/Rate Route Frequency Ordered Stop   05/18/22 1300  ceFAZolin (ANCEF) IVPB 2g/100 mL premix  Status:  Discontinued        2 g 200 mL/hr over 30 Minutes Intravenous Every 8 hours 05/18/22 1212 05/18/22 1233   05/14/22 1130  vancomycin (VANCOCIN) IVPB 1000 mg/200 mL premix        1,000 mg 200 mL/hr over 60 Minutes Intravenous  Once 05/14/22 1038 05/14/22 1429   05/11/22 1900  ceFEPIme (MAXIPIME) 1 g in sodium chloride 0.9 % 100 mL IVPB  Status:  Discontinued        1 g 200 mL/hr over 30 Minutes Intravenous Every 24 hours 05/10/22 1715 05/18/22 1633   05/11/22 1200  vancomycin (VANCOCIN) IVPB 1000 mg/200 mL premix  Status:  Discontinued        1,000 mg 200 mL/hr over 60 Minutes Intravenous Every M-W-F (Hemodialysis) 05/10/22 1715 05/19/22 0925   05/10/22 1730  vancomycin (VANCOREADY) IVPB 1750 mg/350 mL        1,750 mg 175 mL/hr over 120 Minutes Intravenous  Once 05/10/22 1715 05/10/22 2151   05/10/22 1730  ceFEPIme (MAXIPIME) 2 g in sodium chloride 0.9 % 100 mL IVPB        2 g 200 mL/hr over 30 Minutes Intravenous  Once 05/10/22 1715 05/10/22 1840          Medications  (feeding supplement) PROSource Plus  30 mL Oral BID BM   acetaminophen  1,000 mg Oral Q8H   vitamin C  1,000 mg Oral Daily   carvedilol  3.125 mg Oral BID WC   Chlorhexidine Gluconate Cloth  6 each Topical Q0600   Chlorhexidine Gluconate Cloth  6 each Topical Q0600   clopidogrel  75 mg Oral Daily   darbepoetin (ARANESP) injection - DIALYSIS  200 mcg Intravenous Q Fri-HD   docusate sodium  100 mg Oral Daily   feeding supplement (NEPRO CARB STEADY)  237 mL Oral BID BM   heparin injection (subcutaneous)  5,000 Units Subcutaneous Q8H   insulin aspart  0-9 Units Subcutaneous TID WC   insulin aspart  3 Units Subcutaneous TID WC   insulin glargine-yfgn  8 Units  Subcutaneous BID   levETIRAcetam  500 mg Oral QHS   losartan  100 mg Oral Daily   methylphenidate  5 mg Oral Daily   mometasone-formoterol  2 puff Inhalation BID   nutrition supplement (JUVEN)  1 packet Oral BID BM   pantoprazole  40 mg Oral Daily   rosuvastatin  20 mg Oral Daily   sevelamer carbonate  0.8 g Oral TID WC   zinc sulfate  220 mg Oral Daily      Subjective:  Carolyn Russo was seen and examined.  Currently alert and oriented x3, mental status improving.  No fevers or chills, nausea vomiting or diarrhea.  Severe hyperglycemia with CBGs in 400s to 500s due to steroids.   Objective:   Vitals:   05/23/22 0814 05/23/22 0904 05/23/22 0947 05/23/22 1100  BP: (!) 156/57  (!) 152/57 (!) 84/76  Pulse: 80 77  73  Resp: '17  19 18  '$ Temp: 98.5 F (36.9 C)  97.6 F (36.4 C) 97.6 F (36.4 C)  TempSrc: Oral  Oral Oral  SpO2: 100% 97%  100%  Weight:   82.5 kg   Height:       No intake or output data in the 24 hours ending 05/23/22 1130    Wt Readings from Last 3 Encounters:  05/23/22 82.5 kg  04/15/22 81.7 kg  03/30/22 93.9 kg    Physical Exam General: Alert and oriented x 3, NAD (oriented to self, place, stated month June, knows President name), was able to respond to many questions appropriately Cardiovascular: S1 S2 clear, RRR.  Respiratory: CTAB, no wheezing, rales Gastrointestinal: Soft, nontender, nondistended, NBS Ext: right BKA Psych: pleasant alert and oriented x3   Data Reviewed:  I have personally reviewed following labs    CBC Lab Results  Component Value Date   WBC 32.0 (H) 05/23/2022   RBC 2.99 (L) 05/23/2022   HGB 8.7 (L) 05/23/2022   HCT 25.3 (L) 05/23/2022   MCV 84.6 05/23/2022   MCH 29.1 05/23/2022   PLT 521 (H) 05/23/2022   MCHC 34.4 05/23/2022   RDW 19.9 (H) 05/23/2022   LYMPHSABS 2.7 05/23/2022   MONOABS 2.2 (H) 05/23/2022   EOSABS 0.0 05/23/2022   BASOSABS 0.1 16/38/4665     Last metabolic panel Lab Results  Component Value  Date   NA 130 (L) 05/23/2022   K 4.8 05/23/2022   CL 89 (L) 05/23/2022   CO2 23 05/23/2022   BUN 94 (H) 05/23/2022   CREATININE 9.94 (H) 05/23/2022   GLUCOSE 492 (H) 05/23/2022   GFRNONAA 4 (L) 05/23/2022   GFRAA 17 06/19/2020   CALCIUM 10.3 05/23/2022   PHOS 5.4 (H) 05/23/2022   PROT 7.7 05/22/2022   ALBUMIN 2.5 (L) 05/23/2022   LABGLOB 3.2 04/13/2022   AGRATIO 1.2 04/13/2022   BILITOT 1.1 05/22/2022   ALKPHOS 67 05/22/2022   AST 52 (H) 05/22/2022   ALT 61 (H) 05/22/2022   ANIONGAP 18 (H) 05/23/2022    CBG (last 3)  Recent Labs    05/23/22 0816 05/23/22 1034 05/23/22 1057  GLUCAP 448*  429* 407* 418*        Kevontae Burgoon M.D. [facility] 05/23/2022, 11:30 AM  Available via Epic secure chat 7am-7pm After 7 pm, please refer to night coverage provider listed on amion.

## 2022-05-23 NOTE — Progress Notes (Signed)
Physical Therapy Treatment Patient Details Name: Carolyn Russo MRN: [id number] DOB: [birth year] Today's Date: 05/23/2022   History of Present Illness 71 y/o female presented to ED on 05/10/22 for R foot infection after 1st ray amputation x 1 month ago. S/p R BKA on 7/26. PMH: diabetes, hypertension, hyperlipidemia, ESRD on HD MWF, hx of CVA 03/2021    PT Comments    Continuing work on functional mobility and activity tolerance;  Session focused on getting up to EOB, participating in conversation and taking meds while sitting up; Pt needed Max assist to get up to EOB; Once up and sitting, she could maintain balance with R elbow prop, and able to answer majority of questions related to her mobiltiy and getting to/from HD with incr time, but staying on subject; performed some desesitization for pt's RLE; Assisted her back to bed for going to HD; Pt's husband arrived shortly after pt transported, and gave more information re: pt's PLOF, current hospital stay, and considerations for her recovery  Recommendations for follow up therapy are one component of a multi-disciplinary discharge planning process, led by the attending physician.  Recommendations may be updated based on patient status, additional functional criteria and insurance authorization.  Follow Up Recommendations  Skilled nursing-short term rehab (<3 hours/day) Can patient physically be transported by private vehicle: No   Assistance Recommended at Discharge Frequent or constant Supervision/Assistance  Patient can return home with the following Two people to help with walking and/or transfers;Two people to help with bathing/dressing/bathroom;Assistance with feeding;Direct supervision/assist for medications management;Direct supervision/assist for financial management;Assist for transportation   Equipment Recommendations  Hospital bed (hoyer lift)    Recommendations for Other Services       Precautions / Restrictions Restrictions RLE  Weight Bearing: Non weight bearing     Mobility  Bed Mobility Overal bed mobility: Needs Assistance Bed Mobility: Supine to Sit, Sit to Supine     Supine to sit: Max assist Sit to supine: +2 for physical assistance, Max assist   General bed mobility comments: Multimodal cues for initiation of getting up to EOB; moved LLE to EOB with tactile cueing and min assist; heavy mod assist to help RLE to EOB; Max assist and use of bed pad to elevate trunk to sit and square off hips at EOB; 2 person assist to help pt lay back down    Transfers                   General transfer comment: Deferred; pt about to go to HD    Ambulation/Gait                   Stairs             Wheelchair Mobility    Modified Rankin (Stroke Patients Only)       Balance     Sitting balance-Leahy Scale: Poor (approaching Fair) Sitting balance - Comments: seated on EOB; tending to elbow prop on the Right in sitting EOB; able to come to fully upright sitting, and maintian for about 15-20 seconds before sinking back into R elbow prop Postural control: Right lateral lean                                  Cognition Arousal/Alertness: Awake/alert (though sleepy) Behavior During Therapy: Flat affect Overall Cognitive Status: Impaired/Different from baseline Area of Impairment: Orientation, Attention, Following commands, Awareness  Following Commands: Follows one step commands inconsistently, Follows one step commands with increased time   Awareness: Intellectual   General Comments: Sleepy, with eyes occasionally closing; incr time to answer questions, but answers are mostly relevant to questions asked        Exercises      General Comments General comments (skin integrity, edema, etc.): Sat EOB while taking meds prior to going to HD      Pertinent Vitals/Pain Pain Assessment Pain Assessment: Faces Faces Pain Scale: Hurts a little  bit Pain Location: Occasional grimace when pt moves RLE; at one point she indicated she had some R foot pain (phantom pain) Pain Descriptors / Indicators: Grimacing Pain Intervention(s): Monitored during session    Home Living                          Prior Function            PT Goals (current goals can now be found in the care plan section) Acute Rehab PT Goals Patient Stated Goal: did not state PT Goal Formulation: Patient unable to participate in goal setting Time For Goal Achievement: 06/02/22 Potential to Achieve Goals: Fair Progress towards PT goals: Progressing toward goals (slowly)    Frequency    Min 2X/week      PT Plan Current plan remains appropriate    Co-evaluation              AM-PAC PT "6 Clicks" Mobility   Outcome Measure  Help needed turning from your back to your side while in a flat bed without using bedrails?: Total Help needed moving from lying on your back to sitting on the side of a flat bed without using bedrails?: Total Help needed moving to and from a bed to a chair (including a wheelchair)?: Total Help needed standing up from a chair using your arms (e.g., wheelchair or bedside chair)?: Total Help needed to walk in hospital room?: Total Help needed climbing 3-5 steps with a railing? : Total 6 Click Score: 6    End of Session Equipment Utilized During Treatment: Other (comment) (bed pads) Activity Tolerance: Patient tolerated treatment well;Other (comment) (still sleepy, but more alert than PT eval) Patient left: in bed;Other (comment) (transporting to HD) Nurse Communication: Mobility status PT Visit Diagnosis: Muscle weakness (generalized) (M62.81);Other abnormalities of gait and mobility (R26.89)     Time: 7829-5621 PT Time Calculation (min) (ACUTE ONLY): 18 min  Charges:  $Therapeutic Activity: 8-22 mins                     Roney Marion, PT  Acute Rehabilitation Services Office [phone]    [name] 05/23/2022, 10:32 AM

## 2022-05-23 NOTE — Progress Notes (Signed)
Patients AM cbg was [phone]. MD Rai was paged. Orders to give 3 units of novolog, hold sliding scale and give 8 unit semglee. When patient returns from dialysis to page MD and will plan for glucose stabilizer.

## 2022-05-24 DIAGNOSIS — I2510 Atherosclerotic heart disease of native coronary artery without angina pectoris: Secondary | ICD-10-CM | POA: Diagnosis not present

## 2022-05-24 DIAGNOSIS — N184 Chronic kidney disease, stage 4 (severe): Secondary | ICD-10-CM | POA: Diagnosis not present

## 2022-05-24 DIAGNOSIS — G9340 Encephalopathy, unspecified: Secondary | ICD-10-CM | POA: Diagnosis not present

## 2022-05-24 DIAGNOSIS — S88111A Complete traumatic amputation at level between knee and ankle, right lower leg, initial encounter: Secondary | ICD-10-CM | POA: Diagnosis not present

## 2022-05-24 DIAGNOSIS — I6381 Other cerebral infarction due to occlusion or stenosis of small artery: Secondary | ICD-10-CM | POA: Diagnosis not present

## 2022-05-24 DIAGNOSIS — Z992 Dependence on renal dialysis: Secondary | ICD-10-CM | POA: Diagnosis not present

## 2022-05-24 DIAGNOSIS — N186 End stage renal disease: Secondary | ICD-10-CM | POA: Diagnosis not present

## 2022-05-24 LAB — GLUCOSE, CAPILLARY
Glucose-Capillary: 230 mg/dL — ABNORMAL HIGH (ref 70–99)
Glucose-Capillary: 173 mg/dL — ABNORMAL HIGH (ref 70–99)
Glucose-Capillary: 249 mg/dL — ABNORMAL HIGH (ref 70–99)
Glucose-Capillary: 219 mg/dL — ABNORMAL HIGH (ref 70–99)
Glucose-Capillary: 289 mg/dL — ABNORMAL HIGH (ref 70–99)

## 2022-05-24 LAB — RENAL FUNCTION PANEL
Albumin: 2.5 g/dL — ABNORMAL LOW (ref 3.5–5.0)
Anion gap: 12 (ref 5–15)
BUN: 40 mg/dL — ABNORMAL HIGH (ref 8–23)
CO2: 26 mmol/L (ref 22–32)
Calcium: 9.1 mg/dL (ref 8.9–10.3)
Chloride: 94 mmol/L — ABNORMAL LOW (ref 98–111)
Creatinine, Ser: 5.08 mg/dL — ABNORMAL HIGH (ref 0.44–1.00)
GFR, Estimated: 8 mL/min — ABNORMAL LOW (ref >60)
Glucose, Bld: 194 mg/dL — ABNORMAL HIGH (ref 70–99)
Phosphorus: 3.7 mg/dL (ref 2.5–4.6)
Potassium: 3.6 mmol/L (ref 3.5–5.1)
Sodium: 132 mmol/L — ABNORMAL LOW (ref 135–145)

## 2022-05-24 LAB — TROPONIN I (HIGH SENSITIVITY)
Troponin I (High Sensitivity): 22 ng/L — ABNORMAL HIGH (ref <18)
Troponin I (High Sensitivity): 21 ng/L — ABNORMAL HIGH (ref <18)

## 2022-05-24 MED ORDER — INSULIN GLARGINE-YFGN 100 UNIT/ML ~~LOC~~ SOLN
6.0000 [IU] | Freq: Two times a day (BID) | SUBCUTANEOUS | Status: DC
  Administered 2022-05-24 – 2022-05-30 (×13): 6 [IU] via SUBCUTANEOUS
  Filled 2022-05-24 (×15): qty 0.06

## 2022-05-24 MED ORDER — CHLORHEXIDINE GLUCONATE CLOTH 2 % EX PADS
6.0000 | MEDICATED_PAD | Freq: Every day | CUTANEOUS | Status: DC
  Administered 2022-05-24 – 2022-06-10 (×17): 6 via TOPICAL

## 2022-05-24 NOTE — Progress Notes (Addendum)
Pts son wanted only '500mg'$  given when pt got back from dialysis.

## 2022-05-24 NOTE — Progress Notes (Signed)
Called to patients room, patient states she is having chest pain. Asked patient to point to where it hurts she pointed below her left rib cage and to her epigastric area. EKG was obtained and MD was paged. New orders in place.

## 2022-05-24 NOTE — Progress Notes (Signed)
Mobility Specialist Progress Note   05/24/22 1523  Mobility  Activity Transferred from chair to bed  Level of Assistance +2 (takes two people) Tour manager)  Assistive Device MaxiMove  RLE Weight Bearing NWB  Activity Response Tolerated well  $Mobility charge 1 Mobility   Pt requesting to use bedpan d/t urgency of BM. Transferred from chair to bed with the use of  the maximove throughout, No faults or incidents during transfer. Mod A for bed mobility in order to get bed pan under pt. Pt left in bed with all needs met, call bell in reach and bed alarm on.  RN and NT notified about positioning.  Holland Falling Mobility Specialist MS [facility] #:  [phone] Acute Rehab Office:  [phone]

## 2022-05-24 NOTE — Progress Notes (Signed)
Physical Therapy Treatment Patient Details Name: Carolyn Russo MRN: [id number] DOB: 01-21-1951 Today's Date: 05/24/2022   History of Present Illness 71 y/o female presented to ED on 05/10/22 for R foot infection after 1st ray amputation x 1 month ago. S/p R BKA on 7/26. PMH: diabetes, hypertension, hyperlipidemia, ESRD on HD MWF, hx of CVA 03/2021    PT Comments    Pt with nursing prior to therapy arrival with verbalized lower chest pain. Nursing completed EEG and ok'd patient to be treated by therapy. Co-tx completed due to pt requiring increased physical assist due to level of arousal. With increased time, patient was able to answer yes/no questions during session although her eyes remained closed. Utilized lift equipment to transfer to recliner to help aid with level of alertness and digestion needs. Nursing in room with Lab staff with all needs met.    Recommendations for follow up therapy are one component of a multi-disciplinary discharge planning process, led by the attending physician.  Recommendations may be updated based on patient status, additional functional criteria and insurance authorization.  Follow Up Recommendations  Skilled nursing-short term rehab (<3 hours/day) Can patient physically be transported by private vehicle: No   Assistance Recommended at Discharge Frequent or constant Supervision/Assistance  Patient can return home with the following Two people to help with walking and/or transfers;Two people to help with bathing/dressing/bathroom;Assistance with feeding;Direct supervision/assist for medications management;Direct supervision/assist for financial management;Assist for transportation   Equipment Recommendations  Hospital bed (hoyer lift)    Recommendations for Other Services       Precautions / Restrictions Precautions Precautions: Fall Precaution Comments: limb protector Restrictions Weight Bearing Restrictions: Yes RLE Weight Bearing: Non weight bearing      Mobility  Bed Mobility Overal bed mobility: Needs Assistance Bed Mobility: Rolling Rolling: +2 for physical assistance, Max assist              Transfers Overall transfer level: Needs assistance Equipment used: Ambulation equipment used Transfers: Bed to chair/wheelchair/BSC             General transfer comment: Overall tolerated Maximove transfer well Transfer via Lift Equipment: Maximove  Ambulation/Gait                   Stairs             Wheelchair Mobility    Modified Rankin (Stroke Patients Only)       Balance                                            Cognition Arousal/Alertness:  (semi awake with eyes closed. Sleepy. Did answer yes/no questions consistently.) Behavior During Therapy: Flat affect Overall Cognitive Status: Impaired/Different from baseline                                          Exercises      General Comments        Pertinent Vitals/Pain Pain Assessment Pain Assessment: Faces Faces Pain Scale: Hurts a little bit Pain Location: RLE when moved. indicates lower chest pain (nursing aware. see nursing note. Ok'd to see patient after assessing) Pain Descriptors / Indicators: Grimacing Pain Intervention(s): Monitored during session    Home Living  Prior Function            PT Goals (current goals can now be found in the care plan section) Acute Rehab PT Goals Patient Stated Goal: did not state PT Goal Formulation: Patient unable to participate in goal setting Time For Goal Achievement: 06/02/22 Potential to Achieve Goals: Fair Progress towards PT goals: Progressing toward goals (slowly)    Frequency    Min 2X/week      PT Plan Current plan remains appropriate    Co-evaluation PT/OT/SLP Co-Evaluation/Treatment: Yes Reason for Co-Treatment: Complexity of the patient's impairments (multi-system involvement);For  patient/therapist safety;To address functional/ADL transfers PT goals addressed during session: Mobility/safety with mobility OT goals addressed during session: Strengthening/ROM;ADL's and self-care      AM-PAC PT "6 Clicks" Mobility   Outcome Measure  Help needed turning from your back to your side while in a flat bed without using bedrails?: Total Help needed moving from lying on your back to sitting on the side of a flat bed without using bedrails?: Total Help needed moving to and from a bed to a chair (including a wheelchair)?: Total Help needed standing up from a chair using your arms (e.g., wheelchair or bedside chair)?: Total Help needed to walk in hospital room?: Total Help needed climbing 3-5 steps with a railing? : Total 6 Click Score: 6    End of Session Equipment Utilized During Treatment: Other (comment) (bed pads; Maximove) Activity Tolerance: Patient tolerated treatment well;Other (comment) (still sleepy, but more alert than PT eval) Patient left: in chair;with call bell/phone within reach;with chair alarm set Nurse Communication: Mobility status PT Visit Diagnosis: Muscle weakness (generalized) (M62.81);Other abnormalities of gait and mobility (R26.89)     Time: 8638-1771 PT Time Calculation (min) (ACUTE ONLY): 23 min  Charges:  $Therapeutic Activity: 8-22 mins                     Carolyn Russo, PT  Acute Rehabilitation Services Office [phone]    Carolyn Russo 05/24/2022, 3:54 PM

## 2022-05-24 NOTE — TOC Progression Note (Addendum)
Transition of Care [facility]) - Progression Note    Patient Details  Name: Carolyn Russo MRN: [id number] Date of Birth: 08-24-1951  Transition of [facility]) CM/SW [place], Amorita Phone Number: 05/24/2022, 4:40 PM  Clinical Narrative:     Update-CSW was informed by MD patients spouse request patient to go to CIR. CM confirmed she will follow up with patients spouse. TOC will continue to follow. Patient has SN[facility] bed [facility] as back up to CIR.  Patients insurance authorization was approved for SNF. Insurance authorization start date 8/1-8/3. Reference # [id number].   Expected Discharge Plan: [place] (vs CIR vs SNF) Barriers to Discharge: Continued Medical Work up  Expected Discharge Plan and Services Expected Discharge Plan: [place] (vs CIR vs SNF)   Discharge Planning Services: CM Consult                               HH Arranged: RN, PT, Speech Therapy (PTA active with [place]) [place]: [facility] [place] Determinants of Health (SDOH) Interventions    Readmission Risk Interventions     No data to display

## 2022-05-24 NOTE — Progress Notes (Signed)
Occupational Therapy Treatment Patient Details Name: RAELYNNE LUDWICK MRN: [id number] DOB: 28-Jul-1951 Today's Date: 05/24/2022   History of present illness 71 y/o female presented to ED on 05/10/22 for R foot infection after 1st ray amputation x 1 month ago. S/p R BKA on 7/26. PMH: diabetes, hypertension, hyperlipidemia, ESRD on HD MWF, hx of CVA 03/2021   OT comments  Pt with nursing prior to therapy arrival with verbalized lower chest pain. Nursing completed EEG and ok'd patient to be treated by therapy. Co-tx completed due to pt requiring increased physical assist due to level of arousal. OT facilitated abdominal massage at start of session to help increase digestion and help aid with upper abdominal discomfort which nursing believes is due to lack of BM. Education provided to patient to assist with bed mobility and utilize her right arm to assist with bed railing. Pt required set-up although did maintain grasp. With increased time, patient was able to answer yes/no questions during session although her eyes remained closed. Utilized lift equipment to transfer to recliner to help aid with level of alertness and digestion needs. Nursing in room with Lab staff with all needs met.    Recommendations for follow up therapy are one component of a multi-disciplinary discharge planning process, led by the attending physician.  Recommendations may be updated based on patient status, additional functional criteria and insurance authorization.    Follow Up Recommendations  Skilled nursing-short term rehab (<3 hours/day)    Assistance Recommended at Discharge Frequent or constant Supervision/Assistance  Patient can return home with the following  Two people to help with bathing/dressing/bathroom;Two people to help with walking and/or transfers;Assistance with feeding;Assistance with cooking/housework;Assist for transportation;Help with stairs or ramp for entrance;Direct supervision/assist for financial  management;Direct supervision/assist for medications management         Precautions / Restrictions Precautions Precautions: Fall Precaution Comments: limb protector Restrictions Weight Bearing Restrictions: Yes RLE Weight Bearing: Non weight bearing       Mobility Bed Mobility Overal bed mobility: Needs Assistance Bed Mobility: Rolling Rolling: +2 for physical assistance, Max assist        Transfers Overall transfer level: Needs assistance Equipment used: None [facility]) Transfers: Bed to chair/wheelchair/BSC       Transfer via Lift Equipment: Maximove       ADL either performed or assessed with clinical judgement    Extremity/Trunk Assessment Upper Extremity Assessment RUE Deficits / Details: With therapist providing set-up assist, patient was able to maintain grasp on bed railing while lift sling was placed and patient was rolled onto left side for placement.         Cognition Arousal/Alertness:  (semi awake with eyes closed. Sleepy. Did answer yes/no questions consistently.) Behavior During Therapy: Flat affect Overall Cognitive Status: Impaired/Different from baseline     Following Commands: Follows one step commands inconsistently, Follows one step commands with increased time       General Comments: Sleepy, with eyes occasionally closing; increase time to answer questions, but answers are mostly relevant to questions asked        Exercises Other Exercises Other Exercises: Supine abdominal massage completed prior to bed mobility to help increase digestion and decrease lower chest/upper abdominal discomfort/pain.            Pertinent Vitals/ Pain       Pain Assessment Pain Assessment: Faces Faces Pain Scale: Hurts a little bit Pain Location: RLE when moved. indicates lower chest pain (nursing aware. see nursing note. Ok'd to see patient after  assessing) Pain Descriptors / Indicators: Grimacing Pain Intervention(s): Monitored during session          Frequency  Min 2X/week        Progress Toward Goals  OT Goals(current goals can now be found in the care plan section)  Progress towards OT goals: Progressing toward goals     Plan Discharge plan remains appropriate;Frequency remains appropriate    Co-evaluation    PT/OT/SLP Co-Evaluation/Treatment: Yes Reason for Co-Treatment: Complexity of the patient's impairments (multi-system involvement);For patient/therapist safety;To address functional/ADL transfers   OT goals addressed during session: Strengthening/ROM;ADL's and self-care      AM-PAC OT "6 Clicks" Daily Activity     Outcome Measure   Help from another person eating meals?: Total Help from another person taking care of personal grooming?: Total Help from another person toileting, which includes using toliet, bedpan, or urinal?: Total Help from another person bathing (including washing, rinsing, drying)?: Total Help from another person to put on and taking off regular upper body clothing?: Total Help from another person to put on and taking off regular lower body clothing?: Total 6 Click Score: 6    End of Session Equipment Utilized During Treatment: Other (comment) (maxi Move)  OT Visit Diagnosis: Unsteadiness on feet (R26.81);Muscle weakness (generalized) (M62.81)   Activity Tolerance Patient tolerated treatment well;Patient limited by fatigue   Patient Left in chair;with call bell/phone within reach;with nursing/sitter in room;with chair alarm set   Nurse Communication Mobility status        Time: 9678-9381 OT Time Calculation (min): 23 min  Charges: OT General Charges $OT Visit: 1 Visit OT Treatments $Therapeutic Activity: 8-22 mins  Ailene Ravel, OTR/L,CBIS  Supplemental OT - [facility] and [facility]   [place]ai Fluegel, Clarene Duke 05/24/2022, 1:48 PM

## 2022-05-24 NOTE — Progress Notes (Addendum)
[place] for Infectious Disease    Date of Admission:  05/10/2022      ID: Carolyn Russo is a 71 y.o. female with POD#5 s/p right bka Principal Problem:   Leukocytosis Active Problems:   Hypertensive disorder   Type 2 diabetes mellitus (HCC)   Dyslipidemia (high LDL; low HDL)   Obesity (BMI 30-39.9)   CAD (coronary artery disease), native coronary artery   Asthma   Anemia in chronic kidney disease   Diabetic retinopathy (HCC)   Basal ganglia infarction ([place])   ESRD on dialysis ([place])   Gangrene of right foot ([place])   Foot infection   PAD (peripheral artery disease) ([place])   Below-knee amputation of right lower extremity (HCC)   Seizures ([place])   Encephalopathy    Subjective: Remains afebrile .some discomfort to right bka. Sleepy this afternoon.  Medications:   (feeding supplement) PROSource Plus  30 mL Oral BID BM   acetaminophen  1,000 mg Oral Q8H   vitamin C  1,000 mg Oral Daily   carvedilol  3.125 mg Oral BID WC   Chlorhexidine Gluconate Cloth  6 each Topical Q0600   Chlorhexidine Gluconate Cloth  6 each Topical Q0600   Chlorhexidine Gluconate Cloth  6 each Topical Q0600   clopidogrel  75 mg Oral Daily   darbepoetin (ARANESP) injection - DIALYSIS  200 mcg Intravenous Q Fri-HD   docusate sodium  100 mg Oral Daily   feeding supplement (NEPRO CARB STEADY)  237 mL Oral BID BM   heparin injection (subcutaneous)  5,000 Units Subcutaneous Q8H   insulin aspart  0-9 Units Subcutaneous TID WC   insulin aspart  3 Units Subcutaneous TID WC   insulin glargine-yfgn  6 Units Subcutaneous BID   levETIRAcetam  500 mg Oral QHS   losartan  100 mg Oral Daily   methylphenidate  5 mg Oral Daily   mometasone-formoterol  2 puff Inhalation BID   nutrition supplement (JUVEN)  1 packet Oral BID BM   pantoprazole  40 mg Oral Daily   rosuvastatin  20 mg Oral Daily   sevelamer carbonate  0.8 g Oral TID WC   zinc sulfate  220 mg Oral Daily    Objective: Vital signs in last 24  hours: Temp:  [97.4 F (36.3 C)-98.6 F (37 C)] 98 F (36.7 C) (08/01 1243) Pulse Rate:  [61-94] 74 (08/01 1243) Resp:  [17-19] 18 (08/01 1243) BP: (93-156)/(42-82) 149/64 (08/01 1243) SpO2:  [97 %-100 %] 97 % (08/01 1243) Weight:  [78 kg] 78 kg (07/31 2205)  Physical Exam  Constitutional:  oriented to person only. appears well-developed and well-nourished. No distress.  HENT: [place]/AT, PERRLA, no scleral icterus Mouth/Throat: Oropharynx is clear and moist. No oropharyngeal exudate.  Chest wall= hd catheter in place Abdominal: Soft. Bowel sounds are normal.  exhibits no distension. There is no tenderness.  Ext: right bka, wrapped Neurological: alert and oriented to person, place, and time.  Skin: Skin is warm and dry. No rash noted. No erythema.     Lab Results Recent Labs    05/22/22 0204 05/23/22 0133 05/23/22 0711 05/24/22 0555  WBC 31.6* 32.0*  --   --   HGB 8.8* 8.7*  --   --   HCT 26.2* 25.3*  --   --   NA 131* 128* 130* 132*  K 4.8 5.1 4.8 3.6  CL 91* 88* 89* 94*  CO2 '23 22 23 26  '$ BUN 61* 90* 94* 40*  CREATININE 7.87*  9.65* 9.94* 5.08*   Liver Panel Recent Labs    05/22/22 0204 05/23/22 0133 05/24/22 0555  PROT 7.7  --   --   ALBUMIN 2.5* 2.5* 2.5*  AST 52*  --   --   ALT 61*  --   --   ALKPHOS 67  --   --   BILITOT 1.1  --   --     Microbiology: 7/28 blood cx ngtd 7/24 blood cx ngtd 7/18 blood cx ngtd Studies/Results: No results found.   Assessment/Plan: Leukocytosis = anticipate to trend down since no longer on steroids. Previous work up including blood cx NGTD. No overt signs of infection to need abtx at this time.   Right BKA= continue dressing changes per ortho  Daytime sleepiness, ? Confusion = may have some element of day night reversal contributing to confusion   Will sign off.  [facility] for Infectious Diseases Pager: [phone]  05/24/2022, 1:41 PM

## 2022-05-24 NOTE — Progress Notes (Signed)
[facility]                                                                              Carolyn Russo, is a 71 y.o. female, DOB - 02/06/1951, ION:[id number] Admit date - 05/10/2022    Outpatient Primary MD for the patient is Carolyn Chard, MD  LOS - 14  days  Chief Complaint  Patient presents with   Cellulitis       Brief summary   Patient is a 71 year old female with ESRD on HD MWF, acute basal ganglia CVA with left-sided weakness on 11/4399 complicated by seizures, diabetes, HTN, HLD, HFrEF, PAD with associated ischemia, history of lower GI bleed, recent admission 6/23 with dry gangrene of right great toe and underwent elective surgery per Dr. Sherryle Lis of podiatry, return to ED with slight worsening of the leg despite completed 2 weeks of cefadroxil.  Patient noted to have worsening gangrenous changes to the right forefoot. Patient was admitted for further work-up  Assessment & Plan    Gangrene right foot with history of PAD, recent elective right great toe amputation -Vascular surgery and Dr. Sharol Given consulted -Underwent right below-knee amputation on 7/26 -Patient was placed on vancomycin and cefepime, discontinued cefepime on 7/26, DC'd vancomycin on 7/27 (discussed with Dr. Sharol Given, no further need of antibiotics)  Acute metabolic encephalopathy with intermittent confusion In the setting of recent CVA in 03/2021 -At baseline, alert and oriented x3 per family.  Known history of CVA in 03/2021 with 1 episode of seizure. - Ammonia level 11 on 7/24, B12, folate in normal limits -Chest x-ray on 7/24 did not show any infiltrates -Antibiotics have been discontinued after discussing with Dr. Sharol Given -CT head negative for acute CVA  -EEG showed generalized periodic discharges, more likely indicative of toxic metabolic causes, no acute seizures.  Appreciate neurology recommendations. -Minimize sedatives and pain medications -Mental status improving, still not fully at  baseline   Leukocytosis -procalcitonin 1.47, UA and culture 7/28 negative for UTI, blood cultures 7/28 negative so far  -CT head showed no new stroke.  Chest x-ray 7/24 showed no infiltrates. -WBC count trending down, 38.8 on 7/29-> 31.6 on 7/30, received steroids for prophylaxis per CT with contrast -CT chest abdomen pelvis unrevealing for any infection -Expected leukocytosis now due to steroids.  Will recheck CBC in a.m.  ESRD on hemodialysis, MWF -HD per nephrology  Prior history of basal ganglia CVA in 03/2021 with residual left-sided weakness History of seizures -No seizure activity on EEG - continue Keppra 500 mg at bedtime -Continue Plavix, Crestor    Diabetes mellitus type 2, IDDM with complications of ESRD, CVA Severe hyperglycemia due to steroids Recent Labs    05/23/22 1131 05/23/22 1557 05/23/22 2247 05/24/22 0433 05/24/22 0748 05/24/22 1108  GLUCAP 367* 361* 143* 230* 173* 249*    -Will decrease Semglee to 6 units twice daily, continue NovoLog meal coverage 3 units 3 times daily before meals and sensitive sliding scale insulin today - insulin regimen will need to be readjusted again tomorrow as CBGs are improving    Anemia, prior history of GI bleed -H&H stable, repeat CBC in a.m.  Obesity  Estimated body mass index is 28.62 kg/m as calculated from the following:   Height as of this encounter: '5\' 5"'$  (1.651 m).   Weight as of this encounter: 78 kg.  Code Status: Full CODE STATUS DVT Prophylaxis:  SCD's Start: 05/18/22 1213 heparin injection 5,000 Units Start: 05/13/22 1415 Place TED hose Start: 05/10/22 1813   Level of Care: Level of care: Med-Surg Family Communication: Updated patient's husband, Dr. Venetia Russo on the phone.  Disposition Plan:      Remains inpatient appropriate: Dr. Venetia Russo, patient's husband requested CIR as per his preference.  TOC made aware.  Procedures:  Right BKA  Consultants:   Nephrology Orthopedics Vascular  surgery  Antimicrobials:   Anti-infectives (From admission, onward)    Start     Dose/Rate Route Frequency Ordered Stop   05/18/22 1300  ceFAZolin (ANCEF) IVPB 2g/100 mL premix  Status:  Discontinued        2 g 200 mL/hr over 30 Minutes Intravenous Every 8 hours 05/18/22 1212 05/18/22 1233   05/14/22 1130  vancomycin (VANCOCIN) IVPB 1000 mg/200 mL premix        1,000 mg 200 mL/hr over 60 Minutes Intravenous  Once 05/14/22 1038 05/14/22 1429   05/11/22 1900  ceFEPIme (MAXIPIME) 1 g in sodium chloride 0.9 % 100 mL IVPB  Status:  Discontinued        1 g 200 mL/hr over 30 Minutes Intravenous Every 24 hours 05/10/22 1715 05/18/22 1633   05/11/22 1200  vancomycin (VANCOCIN) IVPB 1000 mg/200 mL premix  Status:  Discontinued        1,000 mg 200 mL/hr over 60 Minutes Intravenous Every M-W-F (Hemodialysis) 05/10/22 1715 05/19/22 0925   05/10/22 1730  vancomycin (VANCOREADY) IVPB 1750 mg/350 mL        1,750 mg 175 mL/hr over 120 Minutes Intravenous  Once 05/10/22 1715 05/10/22 2151   05/10/22 1730  ceFEPIme (MAXIPIME) 2 g in sodium chloride 0.9 % 100 mL IVPB        2 g 200 mL/hr over 30 Minutes Intravenous  Once 05/10/22 1715 05/10/22 1840          Medications  (feeding supplement) PROSource Plus  30 mL Oral BID BM   acetaminophen  1,000 mg Oral Q8H   vitamin C  1,000 mg Oral Daily   carvedilol  3.125 mg Oral BID WC   Chlorhexidine Gluconate Cloth  6 each Topical Q0600   Chlorhexidine Gluconate Cloth  6 each Topical Q0600   Chlorhexidine Gluconate Cloth  6 each Topical Q0600   clopidogrel  75 mg Oral Daily   darbepoetin (ARANESP) injection - DIALYSIS  200 mcg Intravenous Q Fri-HD   docusate sodium  100 mg Oral Daily   feeding supplement (NEPRO CARB STEADY)  237 mL Oral BID BM   heparin injection (subcutaneous)  5,000 Units Subcutaneous Q8H   insulin aspart  0-9 Units Subcutaneous TID WC   insulin aspart  3 Units Subcutaneous TID WC   insulin glargine-yfgn  8 Units Subcutaneous BID    levETIRAcetam  500 mg Oral QHS   losartan  100 mg Oral Daily   methylphenidate  5 mg Oral Daily   mometasone-formoterol  2 puff Inhalation BID   nutrition supplement (JUVEN)  1 packet Oral BID BM   pantoprazole  40 mg Oral Daily   rosuvastatin  20 mg Oral Daily   sevelamer carbonate  0.8 g Oral TID WC   zinc sulfate  220 mg Oral Daily  Subjective:   Carolyn Russo was seen and examined.  Much more alert and oriented, mental status is improving.  CBGs are improving.  No acute nausea vomiting, no fevers or chills.  No acute issues overnight  Objective:   Vitals:   05/23/22 2243 05/24/22 0559 05/24/22 0744 05/24/22 0747  BP: (!) 147/44 (!) 142/82 (!) 123/44   Pulse: 64 79 61   Resp:  '17 18 18  '$ Temp:  98 F (36.7 C) 98.6 F (37 C)   TempSrc:  Oral    SpO2: 100% 100% 100%   Weight:      Height:        Intake/Output Summary (Last 24 hours) at 05/24/2022 1332 Last data filed at 05/24/2022 1200 Gross per 24 hour  Intake 180 ml  Output 1.4 ml  Net 178.6 ml      Wt Readings from Last 3 Encounters:  05/23/22 78 kg  04/15/22 81.7 kg  03/30/22 93.9 kg   Physical Exam General: Alert and oriented x 3, NAD Cardiovascular: S1 S2 clear, RRR.  Respiratory: CTAB, no wheezing Gastrointestinal: Soft, nontender, nondistended, NBS Ext: right BKA Neuro: no new deficits Psych: Normal affect and demeanor, alert and oriented x3   Data Reviewed:  I have personally reviewed following labs    CBC Lab Results  Component Value Date   WBC 32.0 (H) 05/23/2022   RBC 2.99 (L) 05/23/2022   HGB 8.7 (L) 05/23/2022   HCT 25.3 (L) 05/23/2022   MCV 84.6 05/23/2022   MCH 29.1 05/23/2022   PLT 521 (H) 05/23/2022   MCHC 34.4 05/23/2022   RDW 19.9 (H) 05/23/2022   LYMPHSABS 2.7 05/23/2022   MONOABS 2.2 (H) 05/23/2022   EOSABS 0.0 05/23/2022   BASOSABS 0.1 06/06/4817     Last metabolic panel Lab Results  Component Value Date   NA 132 (L) 05/24/2022   K 3.6 05/24/2022   CL 94 (L)  05/24/2022   CO2 26 05/24/2022   BUN 40 (H) 05/24/2022   CREATININE 5.08 (H) 05/24/2022   GLUCOSE 194 (H) 05/24/2022   GFRNONAA 8 (L) 05/24/2022   GFRAA 17 06/19/2020   CALCIUM 9.1 05/24/2022   PHOS 3.7 05/24/2022   PROT 7.7 05/22/2022   ALBUMIN 2.5 (L) 05/24/2022   LABGLOB 3.2 04/13/2022   AGRATIO 1.2 04/13/2022   BILITOT 1.1 05/22/2022   ALKPHOS 67 05/22/2022   AST 52 (H) 05/22/2022   ALT 61 (H) 05/22/2022   ANIONGAP 12 05/24/2022    CBG (last 3)  Recent Labs    05/24/22 0433 05/24/22 0748 05/24/22 1108  GLUCAP 230* 173* 249*        Tristram Milian M.D. [facility] 05/24/2022, 1:32 PM  Available via Epic secure chat 7am-7pm After 7 pm, please refer to night coverage provider listed on amion.

## 2022-05-24 NOTE — Progress Notes (Addendum)
[facility] Progress Note   Subjective:   Patient seen and examined at bedside with husband present.  He reports she is much more alert and closer to baseline the past 2 afternoons.  Sleepy today.  Wakes briefly to verbal stimuli and returns to sleep.  Answers "yes" and "no" to some questions.  Denies CP, SOB and n/v/d.    Objective Vitals:   05/23/22 2243 05/24/22 0559 05/24/22 0744 05/24/22 0747  BP: (!) 147/44 (!) 142/82 (!) 123/44   Pulse: 64 79 61   Resp:  '17 18 18  '$ Temp:  98 F (36.7 C) 98.6 F (37 C)   TempSrc:  Oral    SpO2: 100% 100% 100%   Weight:      Height:       Physical Exam General:WDWN female in NAD Heart:RRR, no mrg Lungs:CTAB, nml WOB on RA Abdomen:soft, NTND Extremities:no LE edema, R BKA Dialysis Access: [facility]   Filed Weights   05/20/22 1300 05/23/22 0947 05/23/22 2205  Weight: 79.5 kg 82.5 kg 78 kg    Intake/Output Summary (Last 24 hours) at 05/24/2022 1003 Last data filed at 05/23/2022 2235 Gross per 24 hour  Intake 60 ml  Output 1.4 ml  Net 58.6 ml    Additional Objective Labs: Basic Metabolic Panel: Recent Labs  Lab 05/22/22 0204 05/23/22 0133 05/23/22 0711 05/24/22 0555  NA 131* 128* 130* 132*  K 4.8 5.1 4.8 3.6  CL 91* 88* 89* 94*  CO2 '23 22 23 26  '$ GLUCOSE 444* 551* 492* 194*  BUN 61* 90* 94* 40*  CREATININE 7.87* 9.65* 9.94* 5.08*  CALCIUM 9.8 9.9 10.3 9.1  PHOS 6.5* 5.4*  --  3.7   Liver Function Tests: Recent Labs  Lab 05/22/22 0204 05/23/22 0133 05/24/22 0555  AST 52*  --   --   ALT 61*  --   --   ALKPHOS 67  --   --   BILITOT 1.1  --   --   PROT 7.7  --   --   ALBUMIN 2.5* 2.5* 2.5*   CBC: Recent Labs  Lab 05/19/22 0305 05/20/22 0341 05/21/22 0149 05/22/22 0204 05/23/22 0133  WBC 18.8* 21.5* 38.8* 31.6* 32.0*  NEUTROABS  --   --  28.6* 27.8* 26.3*  HGB 8.4* 8.6* 9.2* 8.8* 8.7*  HCT 25.8* 25.8* 26.9* 26.2* 25.3*  MCV 86.0 83.8 85.1 84.8 84.6  PLT 601* 592* 535* 525* 521*   Blood Culture     Component Value Date/Time   SDES BLOOD BLOOD LEFT HAND 05/20/2022 1505   SPECREQUEST  05/20/2022 1505    BOTTLES DRAWN AEROBIC AND ANAEROBIC Blood Culture adequate volume   CULT  05/20/2022 1505    NO GROWTH 4 DAYS Performed at [facility], [place] [address] [place], [place] [address]    REPTSTATUS PENDING 05/20/2022 1505   CBG: Recent Labs  Lab 05/23/22 1131 05/23/22 1557 05/23/22 2247 05/24/22 0433 05/24/22 0748  GLUCAP 367* 361* 143* 230* 173*    Medications:  sodium chloride     ferric gluconate (FERRLECIT) IVPB Stopped (05/23/22 2136)   magnesium sulfate bolus IVPB      (feeding supplement) PROSource Plus  30 mL Oral BID BM   acetaminophen  1,000 mg Oral Q8H   vitamin C  1,000 mg Oral Daily   carvedilol  3.125 mg Oral BID WC   Chlorhexidine Gluconate Cloth  6 each Topical Q0600   Chlorhexidine Gluconate Cloth  6 each Topical Q0600  clopidogrel  75 mg Oral Daily   darbepoetin (ARANESP) injection - DIALYSIS  200 mcg Intravenous Q Fri-HD   docusate sodium  100 mg Oral Daily   feeding supplement (NEPRO CARB STEADY)  237 mL Oral BID BM   heparin injection (subcutaneous)  5,000 Units Subcutaneous Q8H   insulin aspart  0-9 Units Subcutaneous TID WC   insulin aspart  3 Units Subcutaneous TID WC   insulin glargine-yfgn  8 Units Subcutaneous BID   levETIRAcetam  500 mg Oral QHS   losartan  100 mg Oral Daily   methylphenidate  5 mg Oral Daily   mometasone-formoterol  2 puff Inhalation BID   nutrition supplement (JUVEN)  1 packet Oral BID BM   pantoprazole  40 mg Oral Daily   rosuvastatin  20 mg Oral Daily   sevelamer carbonate  0.8 g Oral TID WC   zinc sulfate  220 mg Oral Daily    Dialysis Orders: MWF - [place] 4 hrs 180NRE 350/700 88.5 kg 2.0 K/2.0 Ca UFP 4  -No heparin -Mircera 200 mcg  IV q 2wks - last 04/29/22 -Venofer '100mg'$  IV X 5 doses-new order, hasn't been given yet -Hectorol 48mg IV TIW   Assessment/Plan: 1. Gangrene R Foot. VVS  consulted. S/P R BKA 05/18/2022 per Dr. DSharol Given Per primary. Cefepime d/c'd 7/26 and Vanc d/c'd 7/27-per Dr. DSharol Given no further need for ABX. 2. Acute Metabolic Encephalopathy/Intermittent Confusion- More confused during this hospitalization. Seroquel stopped, minimize sedatives. CT head (-) CVA. Cefepime has a known side effect encephalopathy so this d/c'd 7/26. Waxing and waning. Managed by primary. 3. Leukocytosis-ID now following - suspect stress reaction. W/u CT negative for infection. Wound vac w/no drainage per Dr. DSharol Given Blood cx 7/28 (-) X 2 days-final report pending. 4. ESRD -MWF. [facility] night after [facility] dwell.   5. Anemia - last HGB 8.7. Expected drop in HGB post op. ABX stopped. Fe load started. Continue weekly ESA.  6. Secondary hyperparathyroidism - Continue binders, Corrected Ca+ 10.4. Holding VDRA.  7. HTN/volume - BP improved. Continue home meds, losartan recently increased 05/18/2022. Continue UF as tolerated. Will need lower EDW on discharge.  8. Nutrition - Very low albumin. Add protein supplements.  9. DMT2-per rpimary 10. H/O CVA-ASA, plavix, Keppra  LJen Mow PA-C [facility] 05/24/2022,10:03 AM  LOS: 14 days   Nephrology attending: Patient was seen and examined at the bedside.  Chart reviewed.  I agree with above. ESRD on HD with gangrene right foot is status post right BKA.  Main issue with acute metabolic encephalopathy.  She looks confused and not following commands.  Reportedly she has waxing and waning of her mental status.  At this condition she is not able to do outpatient dialysis.  We will plan for next HD tomorrow.  Minimize sedatives.  DKatheran James [facility].

## 2022-05-25 DIAGNOSIS — I6381 Other cerebral infarction due to occlusion or stenosis of small artery: Secondary | ICD-10-CM | POA: Diagnosis not present

## 2022-05-25 DIAGNOSIS — I96 Gangrene, not elsewhere classified: Secondary | ICD-10-CM | POA: Diagnosis not present

## 2022-05-25 DIAGNOSIS — S88111A Complete traumatic amputation at level between knee and ankle, right lower leg, initial encounter: Secondary | ICD-10-CM | POA: Diagnosis not present

## 2022-05-25 DIAGNOSIS — D72829 Elevated white blood cell count, unspecified: Secondary | ICD-10-CM | POA: Diagnosis not present

## 2022-05-25 LAB — CBC
HCT: 26.9 % — ABNORMAL LOW (ref 36.0–46.0)
Hemoglobin: 9.2 g/dL — ABNORMAL LOW (ref 12.0–15.0)
MCH: 29.3 pg (ref 26.0–34.0)
MCHC: 34.2 g/dL (ref 30.0–36.0)
MCV: 85.7 fL (ref 80.0–100.0)
Platelets: 430 10*3/uL — ABNORMAL HIGH (ref 150–400)
RBC: 3.14 MIL/uL — ABNORMAL LOW (ref 3.87–5.11)
RDW: 21 % — ABNORMAL HIGH (ref 11.5–15.5)
WBC: 24.1 10*3/uL — ABNORMAL HIGH (ref 4.0–10.5)
nRBC: 6.5 % — ABNORMAL HIGH (ref 0.0–0.2)

## 2022-05-25 LAB — BASIC METABOLIC PANEL
Anion gap: 14 (ref 5–15)
BUN: 66 mg/dL — ABNORMAL HIGH (ref 8–23)
CO2: 24 mmol/L (ref 22–32)
Calcium: 9.5 mg/dL (ref 8.9–10.3)
Chloride: 90 mmol/L — ABNORMAL LOW (ref 98–111)
Creatinine, Ser: 7.44 mg/dL — ABNORMAL HIGH (ref 0.44–1.00)
GFR, Estimated: 5 mL/min — ABNORMAL LOW (ref >60)
Glucose, Bld: 213 mg/dL — ABNORMAL HIGH (ref 70–99)
Potassium: 3.9 mmol/L (ref 3.5–5.1)
Sodium: 128 mmol/L — ABNORMAL LOW (ref 135–145)

## 2022-05-25 LAB — CULTURE, BLOOD (ROUTINE X 2)
Culture: NO GROWTH
Culture: NO GROWTH
Special Requests: ADEQUATE
Special Requests: ADEQUATE

## 2022-05-25 LAB — GLUCOSE, CAPILLARY
Glucose-Capillary: 238 mg/dL — ABNORMAL HIGH (ref 70–99)
Glucose-Capillary: 275 mg/dL — ABNORMAL HIGH (ref 70–99)
Glucose-Capillary: 279 mg/dL — ABNORMAL HIGH (ref 70–99)

## 2022-05-25 MED ORDER — HEPARIN SODIUM (PORCINE) 1000 UNIT/ML IJ SOLN
INTRAMUSCULAR | Status: AC
  Administered 2022-05-25: 3200 [IU] via INTRAVENOUS_CENTRAL
  Filled 2022-05-25: qty 4

## 2022-05-25 NOTE — Progress Notes (Signed)
PROGRESS NOTE    Carolyn Russo  VOZ:[id number] DOB: October 08, 1951 DOA: 05/10/2022 PCP: [name], MD   Brief Narrative: Carolyn Russo is a 71 y.o. female with a history of ESRD on HD, CVA with left hemiplegia, seizures, diabetes, hypertension, hyperlipidemia, chronic systolic heart failure, PAD, GI bleed. Patient presented secondary to worsening right foot infection, found to have gangrene. Empiric antibiotics initiated and patient underwent a right BKA.   Assessment and Plan:  Gangrene of right foot Empiric Vancomycin and Cefepime initiated on admission. Vascular and orthopedic surgery consulted. Patient underwent right BKA on 7/26 with subsequent discontinuation of antibiotics. PT/OT consulted with recommendations for SNF, however family is hoping for possible discharge to CIR.  PAD Contributed to above. -Continue Plavix  Acute metabolic encephalopathy Unsure of etiology. Workup without specific/reversible etiology identified. Possibly related to medication effect. Patient with slow but positive recovery of mental status function.   Leukocytosis Unknown etiology. Possibly related to steroid use. Peak WBC of 38,800. Now trending down.  History of CVA Resultant left hemiplegia. Stable.  ESRD on HD Nephrology consulted. Patient on MWF schedule for HD.  Chronic anemia Anemia of chronic disease Stable.  Diabetes mellitus, type 2 Patient is on Lantus 12 units daily as an outpatient. -Continue Semglee 6 units BID -Continue Novolog 3 units TID with meals and SSI  Obesity Body mass index is 28.62 kg/m. Although BMI is less than 30 kg/m, this is secondary to   DVT prophylaxis: Heparin subq Code Status:   Code Status: Full Code Family Communication: Husband on telephone, 19 minutes Disposition Plan: Discharge to CIR vs SNF; at this time discharge is leaning more towards SNF as patient has been unable to mobilize well enough with PT   Consultants:   Nephrology Orthopedic surgery Vascular surgery  Procedures:  Right BKA (7/26)  Antimicrobials: Vancomycin Cefepime    Subjective: Patient without pain. Not oriented enough to answer all questions.   Objective: BP (!) 148/63 (BP Location: Left Arm)   Pulse 66   Temp 98 F (36.7 C) (Oral)   Resp 16   Ht '5\' 5"'$  (1.651 m)   Wt 78 kg   SpO2 100%   BMI 28.62 kg/m   Examination:  General exam: Appears calm and comfortable Respiratory system: Clear to auscultation. Respiratory effort normal. Cardiovascular system: S1 & S2 heard, RRR. Gastrointestinal system: Abdomen is nondistended, soft and nontender. No organomegaly or masses felt. Normal bowel sounds heard. Central nervous system: Initially difficult to arouse, but eventually became fully alert. Oriented to self. Left-sided hemiplegia. Follows commands. Musculoskeletal: No edema. No calf tenderness Skin: No cyanosis. No rashes Psychiatry: Judgement and insight appear impaired.   Data Reviewed: I have personally reviewed following labs and imaging studies  CBC Lab Results  Component Value Date   WBC 32.0 (H) 05/23/2022   RBC 2.99 (L) 05/23/2022   HGB 8.7 (L) 05/23/2022   HCT 25.3 (L) 05/23/2022   MCV 84.6 05/23/2022   MCH 29.1 05/23/2022   PLT 521 (H) 05/23/2022   MCHC 34.4 05/23/2022   RDW 19.9 (H) 05/23/2022   LYMPHSABS 2.7 05/23/2022   MONOABS 2.2 (H) 05/23/2022   EOSABS 0.0 05/23/2022   BASOSABS 0.1 42/59/5638     Last metabolic panel Lab Results  Component Value Date   NA 132 (L) 05/24/2022   K 3.6 05/24/2022   CL 94 (L) 05/24/2022   CO2 26 05/24/2022   BUN 40 (H) 05/24/2022   CREATININE 5.08 (H) 05/24/2022   GLUCOSE 194 (  H) 05/24/2022   GFRNONAA 8 (L) 05/24/2022   GFRAA 17 06/19/2020   CALCIUM 9.1 05/24/2022   PHOS 3.7 05/24/2022   PROT 7.7 05/22/2022   ALBUMIN 2.5 (L) 05/24/2022   LABGLOB 3.2 04/13/2022   AGRATIO 1.2 04/13/2022   BILITOT 1.1 05/22/2022   ALKPHOS 67 05/22/2022   AST 52  (H) 05/22/2022   ALT 61 (H) 05/22/2022   ANIONGAP 12 05/24/2022    GFR: Estimated Creatinine Clearance: 10.2 mL/min (A) (by C-G formula based on SCr of 5.08 mg/dL (H)).  Recent Results (from the past 240 hour(s))  Culture, blood (Routine X 2) w Reflex to ID Panel     Status: None   Collection Time: 05/16/22  1:18 PM   Specimen: BLOOD  Result Value Ref Range Status   Specimen Description BLOOD LEFT ANTECUBITAL  Final   Special Requests   Final    BOTTLES DRAWN AEROBIC AND ANAEROBIC Blood Culture adequate volume   Culture   Final    NO GROWTH 5 DAYS Performed at [facility], [address], [place], [place] [address]    Report Status 05/21/2022 FINAL  Final  Culture, blood (Routine X 2) w Reflex to ID Panel     Status: None   Collection Time: 05/16/22  1:27 PM   Specimen: BLOOD  Result Value Ref Range Status   Specimen Description BLOOD BLOOD LEFT HAND  Final   Special Requests   Final    BOTTLES DRAWN AEROBIC AND ANAEROBIC Blood Culture results may not be optimal due to an inadequate volume of blood received in culture bottles   Culture   Final    NO GROWTH 5 DAYS Performed at [facility], [place] [address] [place], [place] [address]    Report Status 05/21/2022 FINAL  Final  Surgical pcr screen     Status: None   Collection Time: 05/17/22  6:45 PM   Specimen: Nasal Mucosa; Nasal Swab  Result Value Ref Range Status   MRSA, PCR NEGATIVE NEGATIVE Final   Staphylococcus aureus NEGATIVE NEGATIVE Final    Comment: (NOTE) The Xpert SA Assay (FDA approved for NASAL specimens in patients 32 years of age and older), is one component of a comprehensive surveillance program. It is not intended to diagnose infection nor to guide or monitor treatment. Performed at [facility], [place] [address] [place], [place] [address]   Urine Culture     Status: None   Collection Time: 05/20/22  2:13 PM   Specimen: In/Out Cath Urine  Result Value Ref Range Status   Specimen  Description IN/OUT CATH URINE  Final   Special Requests NONE  Final   Culture   Final    NO GROWTH Performed at [facility], [place] [address], [place], [place] [address]    Report Status 05/21/2022 FINAL  Final  Culture, blood (Routine X 2) w Reflex to ID Panel     Status: None (Preliminary result)   Collection Time: 05/20/22  2:55 PM   Specimen: BLOOD  Result Value Ref Range Status   Specimen Description BLOOD BLOOD LEFT HAND  Final   Special Requests   Final    BOTTLES DRAWN AEROBIC AND ANAEROBIC Blood Culture adequate volume   Culture   Final    NO GROWTH 4 DAYS Performed at [facility], [place] [address], [place], [place] [address]    Report Status PENDING  Incomplete  Culture, blood (Routine X 2) w Reflex  to ID Panel     Status: None (Preliminary result)   Collection Time: 05/20/22  3:05 PM   Specimen: BLOOD  Result Value Ref Range Status   Specimen Description BLOOD BLOOD LEFT HAND  Final   Special Requests   Final    BOTTLES DRAWN AEROBIC AND ANAEROBIC Blood Culture adequate volume   Culture   Final    NO GROWTH 4 DAYS Performed at [facility], [address] [place], [place] [address]    Report Status PENDING  Incomplete      Radiology Studies: No results found.    LOS: 15 days    Cordelia Poche, MD [facility] 05/25/2022, 8:55 AM   If 7PM-7AM, please contact night-coverage [email or web address]

## 2022-05-25 NOTE — Inpatient Diabetes Management (Signed)
Inpatient Diabetes Program Recommendations  AACE/ADA: New Consensus Statement on Inpatient Glycemic Control (2015)  Target Ranges:  Prepandial:   less than 140 mg/dL      Peak postprandial:   less than 180 mg/dL (1-2 hours)      Critically ill patients:  140 - 180 mg/dL   Lab Results  Component Value Date   GLUCAP 275 (H) 05/25/2022   HGBA1C 5.9 (H) 04/15/2022    Review of Glycemic Control  Latest Reference Range & Units 05/24/22 16:27 05/24/22 21:58 05/25/22 07:57 05/25/22 11:58  Glucose-Capillary 70 - 99 mg/dL 219 (H) 289 (H) 238 (H) 275 (H)   Diabetes history: DM 2 Outpatient Diabetes medications: Lantus 12 units qhs, Fiasp 6-16 units tid Current orders for Inpatient glycemic control:  Semglee 6 units bid Novolog 0-9 units tid Novolog 3 units tid meal coverage  Nepro bid between meals  Inpatient Diabetes Program Recommendations:    -  Increase Semglee to 8 units bid -  Increase Novolog meal coverage to 4 units tid if pt eating >50% of meals  Thanks,  Tama Headings RN, MSN, BC-ADM Inpatient Diabetes Coordinator Team Pager [phone] (8a-5p)

## 2022-05-25 NOTE — Progress Notes (Signed)
Inpatient Rehab Admissions Coordinator:   Per family request, pt was re-screened for CIR candidacy by Shann Medal, PT, DPT.  At this time, pt continues to demonstrate decreased arousal and tolerance for intensive therapy.  Only able to tolerate 23 minutes of co-treat with PT/OT yesterday.  Do not feel she is appropriate for CIR at this time.  Will let TOC know.   Shann Medal, PT, DPT Admissions Coordinator [phone] 05/25/22  9:00 AM

## 2022-05-25 NOTE — Progress Notes (Addendum)
[facility] Progress Note   Subjective:   Patient seen and examined at bedside.  More alert today.  Oriented x2.  Deemed not appropriate for CIR d/t inability to tolerate intensive therapy & decreased arousal.  Denies CP, SOB, abdominal pain and n/v/d.    Objective Vitals:   05/24/22 1944 05/24/22 2024 05/25/22 0504 05/25/22 0755  BP: (!) 157/88  (!) 145/94 (!) 148/63  Pulse: 71 71 74 66  Resp: '16 16 15 16  '$ Temp: 97.9 F (36.6 C)   98 F (36.7 C)  TempSrc:    Oral  SpO2: 97% 96% 99% 100%  Weight:      Height:       Physical Exam General:chronically ill appearing female in NAD Heart:RRR, no mrg Lungs:CTAB, nml WOB on RA Abdomen:soft, NTND Extremities:no LE edema, R BKA Dialysis Access: [facility]   Filed Weights   05/20/22 1300 05/23/22 0947 05/23/22 2205  Weight: 79.5 kg 82.5 kg 78 kg    Intake/Output Summary (Last 24 hours) at 05/25/2022 0933 Last data filed at 05/24/2022 1200 Gross per 24 hour  Intake 120 ml  Output --  Net 120 ml    Additional Objective Labs: Basic Metabolic Panel: Recent Labs  Lab 05/22/22 0204 05/23/22 0133 05/23/22 0711 05/24/22 0555  NA 131* 128* 130* 132*  K 4.8 5.1 4.8 3.6  CL 91* 88* 89* 94*  CO2 '23 22 23 26  '$ GLUCOSE 444* 551* 492* 194*  BUN 61* 90* 94* 40*  CREATININE 7.87* 9.65* 9.94* 5.08*  CALCIUM 9.8 9.9 10.3 9.1  PHOS 6.5* 5.4*  --  3.7   Liver Function Tests: Recent Labs  Lab 05/22/22 0204 05/23/22 0133 05/24/22 0555  AST 52*  --   --   ALT 61*  --   --   ALKPHOS 67  --   --   BILITOT 1.1  --   --   PROT 7.7  --   --   ALBUMIN 2.5* 2.5* 2.5*   CBC: Recent Labs  Lab 05/19/22 0305 05/20/22 0341 05/21/22 0149 05/22/22 0204 05/23/22 0133  WBC 18.8* 21.5* 38.8* 31.6* 32.0*  NEUTROABS  --   --  28.6* 27.8* 26.3*  HGB 8.4* 8.6* 9.2* 8.8* 8.7*  HCT 25.8* 25.8* 26.9* 26.2* 25.3*  MCV 86.0 83.8 85.1 84.8 84.6  PLT 601* 592* 535* 525* 521*    Medications:  sodium chloride     ferric gluconate  (FERRLECIT) IVPB Stopped (05/23/22 2136)   magnesium sulfate bolus IVPB      (feeding supplement) PROSource Plus  30 mL Oral BID BM   acetaminophen  1,000 mg Oral Q8H   vitamin C  1,000 mg Oral Daily   carvedilol  3.125 mg Oral BID WC   Chlorhexidine Gluconate Cloth  6 each Topical Q0600   clopidogrel  75 mg Oral Daily   darbepoetin (ARANESP) injection - DIALYSIS  200 mcg Intravenous Q Fri-HD   docusate sodium  100 mg Oral Daily   feeding supplement (NEPRO CARB STEADY)  237 mL Oral BID BM   heparin injection (subcutaneous)  5,000 Units Subcutaneous Q8H   insulin aspart  0-9 Units Subcutaneous TID WC   insulin aspart  3 Units Subcutaneous TID WC   insulin glargine-yfgn  6 Units Subcutaneous BID   levETIRAcetam  500 mg Oral QHS   losartan  100 mg Oral Daily   methylphenidate  5 mg Oral Daily   mometasone-formoterol  2 puff Inhalation BID   nutrition supplement (JUVEN)  1  packet Oral BID BM   pantoprazole  40 mg Oral Daily   rosuvastatin  20 mg Oral Daily   sevelamer carbonate  0.8 g Oral TID WC   zinc sulfate  220 mg Oral Daily    Dialysis Orders: MWF - [place] 4 hrs 180NRE 350/700 88.5 kg 2.0 K/2.0 Ca UFP 4  -No heparin -Mircera 200 mcg  IV q 2wks - last 04/29/22 -Venofer '100mg'$  IV X 5 doses-new order, hasn't been given yet -Hectorol 44mg IV TIW   Assessment/Plan: 1. Gangrene R Foot. VVS consulted. S/P R BKA 05/18/2022 per Dr. DSharol Given Per primary. Cefepime d/c'd 7/26 and Vanc d/c'd 7/27-per Dr. DSharol Given no further need for ABX. 2. Acute Metabolic Encephalopathy/Intermittent Confusion- More confused during this hospitalization. Seroquel stopped, minimize sedatives. CT head (-) CVA. Cefepime has a known side effect encephalopathy so this d/c'd 7/26. Waxing and waning. Managed by primary. 3. Leukocytosis-ID consulted - suspected d/t steroids, now d/c. Today's lab pending. W/u CT negative for infection. Wound vac w/no drainage per Dr. DSharol Given Blood cx 7/28 (-) X 2 days-final  report pending. 4. ESRD -MWF. Next HD 8/2..Marland Kitchen5. Anemia - Hgb 9.2. - trending back up. ABX stopped. Fe load started. Continue weekly ESA.  6. Secondary hyperparathyroidism - Continue binders, Corrected Ca+ 10.4. Holding VDRA.  7. HTN/volume - BP improved. Continue home meds, losartan recently increased 05/18/2022. Continue UF as tolerated. Will need lower EDW on discharge.  8. Nutrition - Very low albumin. Add protein supplements.  9. DMT2-per primary 10. H/O CVA-ASA, plavix, Keppra  LJen Mow PA-C [place]8/11/2021,9:33 AM  LOS: 15 days   Nephrology attending: I have personally seen and examined the patient.  Chart reviewed.  I agree with above. She looks more alert today than yesterday however still looks very frail and debilitated.  Not fully oriented.  Her husband who is an ophthalmologist presents at the bedside.  Plan for regular dialysis today.  Not a candidate for CIR therefore possibly go to SNF.  DKatheran James  [facility].

## 2022-05-25 NOTE — Progress Notes (Signed)
Received patient in bed tin the HD unit with on going HD.  Pt a bit anxious, antsy. Only oriented to self.  Report received from [place] RN  Informed consent signed and in  chart.   Treatment completed: 2100H  Patient tolerated tx Transported back to the room  alert, not in acute distress.  Hand-off given to patient's nurse.   Access used: CVC Access issues: none reported  Total UF removed: 1.9L Medication(s) given: Zofran '4mg'$  IV and Ferricit ( Refer MAR)  Post HD VS: BP 126/14mhg, HR 68bpm, RR 22, Sats 100% on RA.   Post HD weight: 78.2kg   RYevette EdwardsKidney Dialysis Unit

## 2022-05-25 NOTE — TOC Progression Note (Signed)
Transition of Care [facility]) - Progression Note    Patient Details  Name: Carolyn Russo MRN: [id number] Date of Birth: 12/17/1950  Transition of Care [facility]) CM/SW [place], [place] Phone Number: 05/25/2022, 10:58 AM  Clinical Narrative:     CSW spoke with patients spouse and son and informed them that CIR screened patient for CIR and not recommended at this time. Patient has SNF bed at [facility]. Patient and patients son confirmed they can transport patient to and from HD no GTA access [place] application needed. CSW informed Juliann Pulse with [facility]. CSW provided Juliann Pulse with [facility] HD information for patient. Patients insurance authorization approval is through 8/3.CSW will continue to follow and assist with patients dc planning needs.  Expected Discharge Plan: [place] (vs CIR vs SNF) Barriers to Discharge: Continued Medical Work up  Expected Discharge Plan and Services Expected Discharge Plan: [place] (vs CIR vs SNF)   Discharge Planning Services: CM Consult                               HH Arranged: RN, PT, Speech Therapy (PTA active with [employer]) [place]: [place] Determinants of Health (SDOH) Interventions    Readmission Risk Interventions     No data to display

## 2022-05-26 ENCOUNTER — Encounter: Payer: Medicare PPO | Admitting: Podiatry

## 2022-05-26 DIAGNOSIS — I6381 Other cerebral infarction due to occlusion or stenosis of small artery: Secondary | ICD-10-CM | POA: Diagnosis not present

## 2022-05-26 DIAGNOSIS — D72829 Elevated white blood cell count, unspecified: Secondary | ICD-10-CM | POA: Diagnosis not present

## 2022-05-26 DIAGNOSIS — I96 Gangrene, not elsewhere classified: Secondary | ICD-10-CM | POA: Diagnosis not present

## 2022-05-26 DIAGNOSIS — S88111A Complete traumatic amputation at level between knee and ankle, right lower leg, initial encounter: Secondary | ICD-10-CM | POA: Diagnosis not present

## 2022-05-26 LAB — GLUCOSE, CAPILLARY
Glucose-Capillary: 172 mg/dL — ABNORMAL HIGH (ref 70–99)
Glucose-Capillary: 151 mg/dL — ABNORMAL HIGH (ref 70–99)
Glucose-Capillary: 201 mg/dL — ABNORMAL HIGH (ref 70–99)
Glucose-Capillary: 146 mg/dL — ABNORMAL HIGH (ref 70–99)

## 2022-05-26 LAB — PATHOLOGIST SMEAR REVIEW

## 2022-05-26 MED ORDER — HYDROCORTISONE (PERIANAL) 2.5 % EX CREA
TOPICAL_CREAM | Freq: Two times a day (BID) | CUTANEOUS | Status: DC
Start: 2022-05-26 — End: 2022-06-10
  Administered 2022-06-06: 1 via RECTAL
  Filled 2022-05-26: qty 28.35

## 2022-05-26 MED ORDER — METHYLPHENIDATE HCL 5 MG PO TABS
5.0000 mg | ORAL_TABLET | Freq: Two times a day (BID) | ORAL | Status: DC
  Administered 2022-05-27 – 2022-06-10 (×28): 5 mg via ORAL
  Filled 2022-05-26 (×28): qty 1

## 2022-05-26 NOTE — Progress Notes (Signed)
PROGRESS NOTE    Carolyn Russo  NGE:[id number] DOB: [birth year] DOA: 05/10/2022 PCP: [name], MD   Brief Narrative: Carolyn Russo is a 71 y.o. female with a history of ESRD on HD, CVA with left hemiplegia, seizures, diabetes, hypertension, hyperlipidemia, chronic systolic heart failure, PAD, GI bleed. Patient presented secondary to worsening right foot infection, found to have gangrene. Empiric antibiotics initiated and patient underwent a right BKA.   Assessment and Plan:  Gangrene of right foot Empiric Vancomycin and Cefepime initiated on admission. Vascular and orthopedic surgery consulted. Patient underwent right BKA on 7/26 with subsequent discontinuation of antibiotics. PT/OT consulted with recommendations for SNF, however family is hoping for possible discharge to CIR.  PAD Contributed to above. -Continue Plavix  Acute metabolic encephalopathy Unsure of etiology. Workup without specific/reversible etiology identified. Possibly related to medication effect. Patient with slow but positive recovery of mental status function.   Leukocytosis Unknown etiology. Possibly related to steroid use. Peak WBC of 38,800. Now trending down. -CBC in AM  History of CVA Resultant left hemiplegia. Stable.  ESRD on HD Nephrology consulted. Patient on MWF schedule for HD.  Chronic anemia Anemia of chronic disease Stable.  Diabetes mellitus, type 2 Patient is on Lantus 12 units daily as an outpatient. Fasting blood sugar of 172 mg/dL this morning. -Continue Semglee 6 units BID; may need to increase dose -Continue Novolog 3 units TID with meals and SSI  Obesity Body mass index is 28.69 kg/m. Although BMI is less than 30 kg/m, this is secondary to   DVT prophylaxis: Heparin subq Code Status:   Code Status: Full Code Family Communication: Husband at bedside Disposition Plan: Discharge to CIR vs SNF; at this time discharge is leaning more towards SNF as patient has been unable  to mobilize well enough with PT. Anticipate medically stable for discharge in 24 hours if mental status remains stable.   Consultants:  Nephrology Orthopedic surgery Vascular surgery  Procedures:  Right BKA (7/26)  Antimicrobials: Vancomycin Cefepime    Subjective: No concerns today except wanting to eat.  Objective: BP (!) 133/52 (BP Location: Left Arm)   Pulse 82   Temp 98.5 F (36.9 C) (Oral)   Resp 16   Ht '5\' 5"'$  (1.651 m)   Wt 78.2 kg   SpO2 99%   BMI 28.69 kg/m   Examination:  General exam: Appears calm and comfortable Respiratory system: Clear to auscultation. Respiratory effort normal. Cardiovascular system: S1 & S2 heard, normal rate with irregular rhythm 2/6 systolic murmur Gastrointestinal system: Abdomen is nondistended, soft and nontender. No organomegaly or masses felt. Normal bowel sounds heard. Central nervous system: Alert and oriented to self and place. Slow to respond to questions. Follows commands   Data Reviewed: I have personally reviewed following labs and imaging studies  CBC Lab Results  Component Value Date   WBC 24.1 (H) 05/25/2022   RBC 3.14 (L) 05/25/2022   HGB 9.2 (L) 05/25/2022   HCT 26.9 (L) 05/25/2022   MCV 85.7 05/25/2022   MCH 29.3 05/25/2022   PLT 430 (H) 05/25/2022   MCHC 34.2 05/25/2022   RDW 21.0 (H) 05/25/2022   LYMPHSABS 2.7 05/23/2022   MONOABS 2.2 (H) 05/23/2022   EOSABS 0.0 05/23/2022   BASOSABS 0.1 40/07/2724     Last metabolic panel Lab Results  Component Value Date   NA 128 (L) 05/25/2022   K 3.9 05/25/2022   CL 90 (L) 05/25/2022   CO2 24 05/25/2022   BUN 66 (H)  05/25/2022   CREATININE 7.44 (H) 05/25/2022   GLUCOSE 213 (H) 05/25/2022   GFRNONAA 5 (L) 05/25/2022   GFRAA 17 06/19/2020   CALCIUM 9.5 05/25/2022   PHOS 3.7 05/24/2022   PROT 7.7 05/22/2022   ALBUMIN 2.5 (L) 05/24/2022   LABGLOB 3.2 04/13/2022   AGRATIO 1.2 04/13/2022   BILITOT 1.1 05/22/2022   ALKPHOS 67 05/22/2022   AST 52 (H)  05/22/2022   ALT 61 (H) 05/22/2022   ANIONGAP 14 05/25/2022    GFR: Estimated Creatinine Clearance: 7 mL/min (A) (by C-G formula based on SCr of 7.44 mg/dL (H)).  Recent Results (from the past 240 hour(s))  Culture, blood (Routine X 2) w Reflex to ID Panel     Status: None   Collection Time: 05/16/22  1:18 PM   Specimen: BLOOD  Result Value Ref Range Status   Specimen Description BLOOD LEFT ANTECUBITAL  Final   Special Requests   Final    BOTTLES DRAWN AEROBIC AND ANAEROBIC Blood Culture adequate volume   Culture   Final    NO GROWTH 5 DAYS Performed at [facility], [address], [place], [place] [address]    Report Status 05/21/2022 FINAL  Final  Culture, blood (Routine X 2) w Reflex to ID Panel     Status: None   Collection Time: 05/16/22  1:27 PM   Specimen: BLOOD  Result Value Ref Range Status   Specimen Description BLOOD BLOOD LEFT HAND  Final   Special Requests   Final    BOTTLES DRAWN AEROBIC AND ANAEROBIC Blood Culture results may not be optimal due to an inadequate volume of blood received in culture bottles   Culture   Final    NO GROWTH 5 DAYS Performed at [facility], [place] [address], [place], [place] [address]    Report Status 05/21/2022 FINAL  Final  Surgical pcr screen     Status: None   Collection Time: 05/17/22  6:45 PM   Specimen: Nasal Mucosa; Nasal Swab  Result Value Ref Range Status   MRSA, PCR NEGATIVE NEGATIVE Final   Staphylococcus aureus NEGATIVE NEGATIVE Final    Comment: (NOTE) The Xpert SA Assay (FDA approved for NASAL specimens in patients 35 years of age and older), is one component of a comprehensive surveillance program. It is not intended to diagnose infection nor to guide or monitor treatment. Performed at [facility], [place] [address] [place], [name] [address]   Urine Culture     Status: None   Collection Time: 05/20/22  2:13 PM   Specimen: In/Out Cath Urine  Result Value Ref Range Status   Specimen  Description IN/OUT CATH URINE  Final   Special Requests NONE  Final   Culture   Final    NO GROWTH Performed at [facility], [place] [address] [place], [place] [address]    Report Status 05/21/2022 FINAL  Final  Culture, blood (Routine X 2) w Reflex to ID Panel     Status: None   Collection Time: 05/20/22  2:55 PM   Specimen: BLOOD  Result Value Ref Range Status   Specimen Description BLOOD BLOOD LEFT HAND  Final   Special Requests   Final    BOTTLES DRAWN AEROBIC AND ANAEROBIC Blood Culture adequate volume   Culture   Final    NO GROWTH 5 DAYS Performed at [facility], [place] [address] [place], [place] [address]    Report Status 05/25/2022 FINAL  Final  Culture, blood (Routine X 2) w Reflex to ID Panel     Status: None   Collection Time: 05/20/22  3:05 PM   Specimen: BLOOD  Result Value Ref Range Status   Specimen Description BLOOD BLOOD LEFT HAND  Final   Special Requests   Final    BOTTLES DRAWN AEROBIC AND ANAEROBIC Blood Culture adequate volume   Culture   Final    NO GROWTH 5 DAYS Performed at [facility], [address], [place], [place] [address]    Report Status 05/25/2022 FINAL  Final      Radiology Studies: No results found.    LOS: 16 days    Cordelia Poche, MD [facility] 05/26/2022, 8:48 AM   If 7PM-7AM, please contact night-coverage [email or web address]

## 2022-05-26 NOTE — Progress Notes (Addendum)
[facility] Progress Note   Subjective:   Patient seen and examined at bedside. Just finished breakfast.  Sleepy.  Awakes briefly to verbal stimuli then returns to sleep.  Husband reports she is typically sleepy after meals and was more alert earlier.   Objective Vitals:   05/25/22 2115 05/25/22 2153 05/26/22 0551 05/26/22 0738  BP: (!) 106/40 (!) 115/48 (!) 108/93 (!) 133/52  Pulse: 72 72 79 82  Resp: '19 18 15 16  '$ Temp:  97.9 F (36.6 C)  98.5 F (36.9 C)  TempSrc:  Axillary  Oral  SpO2: 100% 99% 100% 99%  Weight:      Height:       Physical Exam General:chronically ill appearing female in NAD Heart:RRR, no mrg Lungs:mostly CTAB, nml WOB on RA Abdomen:soft, NTND Extremities:no LE edema, R BKA Dialysis Access: [facility]   Filed Weights   05/23/22 2205 05/25/22 1730 05/25/22 2100  Weight: 78 kg 79.6 kg 78.2 kg    Intake/Output Summary (Last 24 hours) at 05/26/2022 0958 Last data filed at 05/25/2022 2100 Gross per 24 hour  Intake 240 ml  Output 2.9 ml  Net 237.1 ml    Additional Objective Labs: Basic Metabolic Panel: Recent Labs  Lab 05/22/22 0204 05/23/22 0133 05/23/22 0711 05/24/22 0555 05/25/22 0909  NA 131* 128* 130* 132* 128*  K 4.8 5.1 4.8 3.6 3.9  CL 91* 88* 89* 94* 90*  CO2 '23 22 23 26 24  '$ GLUCOSE 444* 551* 492* 194* 213*  BUN 61* 90* 94* 40* 66*  CREATININE 7.87* 9.65* 9.94* 5.08* 7.44*  CALCIUM 9.8 9.9 10.3 9.1 9.5  PHOS 6.5* 5.4*  --  3.7  --    Liver Function Tests: Recent Labs  Lab 05/22/22 0204 05/23/22 0133 05/24/22 0555  AST 52*  --   --   ALT 61*  --   --   ALKPHOS 67  --   --   BILITOT 1.1  --   --   PROT 7.7  --   --   ALBUMIN 2.5* 2.5* 2.5*    CBC: Recent Labs  Lab 05/20/22 0341 05/21/22 0149 05/22/22 0204 05/23/22 0133 05/25/22 0909  WBC 21.5* 38.8* 31.6* 32.0* 24.1*  NEUTROABS  --  28.6* 27.8* 26.3*  --   HGB 8.6* 9.2* 8.8* 8.7* 9.2*  HCT 25.8* 26.9* 26.2* 25.3* 26.9*  MCV 83.8 85.1 84.8 84.6 85.7  PLT  592* 535* 525* 521* 430*   CBG: Recent Labs  Lab 05/24/22 2158 05/25/22 0757 05/25/22 1158 05/25/22 2247 05/26/22 0752  GLUCAP 289* 238* 275* 279* 172*    Medications:  ferric gluconate (FERRLECIT) IVPB Stopped (05/25/22 1830)   magnesium sulfate bolus IVPB      (feeding supplement) PROSource Plus  30 mL Oral BID BM   acetaminophen  1,000 mg Oral Q8H   vitamin C  1,000 mg Oral Daily   carvedilol  3.125 mg Oral BID WC   Chlorhexidine Gluconate Cloth  6 each Topical Q0600   clopidogrel  75 mg Oral Daily   darbepoetin (ARANESP) injection - DIALYSIS  200 mcg Intravenous Q Fri-HD   docusate sodium  100 mg Oral Daily   feeding supplement (NEPRO CARB STEADY)  237 mL Oral BID BM   heparin injection (subcutaneous)  5,000 Units Subcutaneous Q8H   hydrocortisone   Rectal BID   insulin aspart  0-9 Units Subcutaneous TID WC   insulin aspart  3 Units Subcutaneous TID WC   insulin glargine-yfgn  6 Units Subcutaneous  BID   levETIRAcetam  500 mg Oral QHS   losartan  100 mg Oral Daily   methylphenidate  5 mg Oral Daily   mometasone-formoterol  2 puff Inhalation BID   nutrition supplement (JUVEN)  1 packet Oral BID BM   pantoprazole  40 mg Oral Daily   rosuvastatin  20 mg Oral Daily   sevelamer carbonate  0.8 g Oral TID WC   zinc sulfate  220 mg Oral Daily    Dialysis Orders: MWF - [place] 4 hrs 180NRE 350/700 88.5 kg 2.0 K/2.0 Ca UFP 4  -No heparin -Mircera 200 mcg  IV q 2wks - last 04/29/22 -Venofer '100mg'$  IV X 5 doses-new order, hasn't been given yet -Hectorol 101mg IV TIW   Assessment/Plan: 1. Gangrene R Foot. VVS consulted. S/P R BKA 05/18/2022 per Dr. DSharol Given Per primary. Cefepime d/c'd 7/26 and Vanc d/c'd 7/27-per Dr. DSharol Given no further need for ABX. 2. Acute Metabolic Encephalopathy/Intermittent Confusion- More confused during this hospitalization. Seroquel stopped, minimize sedatives. CT head (-) CVA. Cefepime has a known side effect encephalopathy so this d/c'd  7/26. Waxing and waning. Managed by primary. 3. Leukocytosis-ID consulted - suspected d/t steroids, now d/c. Trending back down. W/u CT negative for infection. Wound vac w/no drainage per Dr. DSharol Given Blood cx 7/28 (-) X 2 days-final report pending. 4. ESRD -MWF. Next HD 8/4. 5. Anemia - Hgb 9.2. - trending back up. ABX stopped. Fe load started. Continue weekly ESA.  6. Secondary hyperparathyroidism - Continue binders, Corrected Ca+ 10.4. Holding VDRA.  7. HTN/volume - BP improved. Continue home meds, losartan recently increased 05/18/2022. Continue UF as tolerated. Will need lower EDW on discharge.  8. Nutrition - Very low albumin. Add protein supplements.  9. DMT2-per primary 10. H/O CVA-ASA, plavix, Keppra  LJen Mow PA-C [place]8/12/2021,9:58 AM  LOS: 16 days   Nephrology attending: I have personally seen and examined the patient.  Chart reviewed.  I agree with above. Tolerated dialysis well yesterday with around 2 L ultrafiltration.  This morning she was trying to eat breakfast with the help of her husband.  She remains confused which seems to be her new baseline.  Benefit from SNF.  Next dialysis tomorrow.  Recommend fluid restriction and UF during HD. DKatheran James CKA

## 2022-05-26 NOTE — Progress Notes (Signed)
Physical Therapy Treatment Patient Details Name: Carolyn Russo MRN: [id number] DOB: Feb 15, 1951 Today's Date: 05/26/2022   History of Present Illness 71 y/o female presented to ED on 05/10/22 for R foot infection after 1st ray amputation x 1 month ago. S/p R BKA on 7/26. PMH: diabetes, hypertension, hyperlipidemia, ESRD on HD MWF, hx of CVA 03/2021    PT Comments    Patient continues to be limited by impaired cognition, weakness, and lethargy. Patient awake on arrival but quickly became lethargic with bed flat and rolling requiring totalA+2. Son present and understanding of current SNF recommendation as patient is unable to tolerate 3 hour of therapy for CIR consideration. Continue to recommend SNF for ongoing Physical Therapy.    3   Recommendations for follow up therapy are one component of a multi-disciplinary discharge planning process, led by the attending physician.  Recommendations may be updated based on patient status, additional functional criteria and insurance authorization.  Follow Up Recommendations  Skilled nursing-short term rehab (<3 hours/day) Can patient physically be transported by private vehicle: No   Assistance Recommended at Discharge Frequent or constant Supervision/Assistance  Patient can return home with the following Two people to help with walking and/or transfers;Two people to help with bathing/dressing/bathroom;Assistance with feeding;Direct supervision/assist for medications management;Direct supervision/assist for financial management;Assist for transportation   Equipment Recommendations  Hospital bed (hoyer lift)    Recommendations for Other Services       Precautions / Restrictions Precautions Precautions: Fall Precaution Comments: limb protector Restrictions Weight Bearing Restrictions: Yes RLE Weight Bearing: Non weight bearing     Mobility  Bed Mobility Overal bed mobility: Needs Assistance Bed Mobility: Rolling Rolling: Total assist, +2 for  physical assistance, +2 for safety/equipment         General bed mobility comments: totalA+2 to roll L/R for bed pan placement and linen change. Patient lethargic after rolling and minimal participation    Transfers                        Ambulation/Gait                   Stairs             Wheelchair Mobility    Modified Rankin (Stroke Patients Only)       Balance                                            Cognition Arousal/Alertness: Awake/alert, Lethargic Behavior During Therapy: Flat affect Overall Cognitive Status: Impaired/Different from baseline Area of Impairment: Orientation, Attention, Following commands, Awareness                 Orientation Level: Disoriented to, Place, Time, Situation Current Attention Level: Focused   Following Commands: Follows one step commands inconsistently, Follows one step commands with increased time   Awareness: Intellectual   General Comments: awake until laid flat then lethargic and sleepy. Follows commands intermittently        Exercises      General Comments        Pertinent Vitals/Pain Pain Assessment Pain Assessment: Faces Faces Pain Scale: No hurt Pain Intervention(s): Monitored during session    Home Living                          Prior Function  PT Goals (current goals can now be found in the care plan section) Acute Rehab PT Goals Patient Stated Goal: did not state PT Goal Formulation: Patient unable to participate in goal setting Time For Goal Achievement: 06/02/22 Potential to Achieve Goals: Fair Progress towards PT goals: Not progressing toward goals - comment    Frequency    Min 2X/week      PT Plan Current plan remains appropriate    Co-evaluation PT/OT/SLP Co-Evaluation/Treatment: Yes Reason for Co-Treatment: For patient/therapist safety;Necessary to address cognition/behavior during functional activity;To  address functional/ADL transfers PT goals addressed during session: Mobility/safety with mobility        AM-PAC PT "6 Clicks" Mobility   Outcome Measure  Help needed turning from your back to your side while in a flat bed without using bedrails?: Total Help needed moving from lying on your back to sitting on the side of a flat bed without using bedrails?: Total Help needed moving to and from a bed to a chair (including a wheelchair)?: Total Help needed standing up from a chair using your arms (e.g., wheelchair or bedside chair)?: Total Help needed to walk in hospital room?: Total Help needed climbing 3-5 steps with a railing? : Total 6 Click Score: 6    End of Session   Activity Tolerance: Patient limited by lethargy Patient left: in bed;with call bell/phone within reach;with bed alarm set;with family/visitor present Nurse Communication: Mobility status PT Visit Diagnosis: Muscle weakness (generalized) (M62.81);Other abnormalities of gait and mobility (R26.89)     Time: 1430-1510 PT Time Calculation (min) (ACUTE ONLY): 40 min  Charges:  $Therapeutic Activity: 23-37 mins                     Zamyah Wiesman A. Gilford Rile PT, DPT Acute Rehabilitation Services Office [phone]    Linna Hoff 05/26/2022, 5:12 PM

## 2022-05-26 NOTE — Progress Notes (Signed)
Patient ID: Carolyn Russo, female   DOB: 08-06-1951, 71 y.o.   MRN: [id number] Patient is seen in follow-up for right transtibial amputation.  There is no drainage in the wound VAC canister there is a good suction fit.  Therapy does not feel that patient is a good candidate for inpatient rehab.  Patient will need discharge to skilled nursing.  Plan for discharge with the portable Praveena wound VAC pump.

## 2022-05-26 NOTE — Plan of Care (Signed)
  Problem: Education: Goal: Ability to describe self-care measures that may prevent or decrease complications (Diabetes Survival Skills Education) will improve Outcome: Progressing   Problem: Skin Integrity: Goal: Risk for impaired skin integrity will decrease Outcome: Progressing   Problem: Tissue Perfusion: Goal: Adequacy of tissue perfusion will improve Outcome: Progressing   

## 2022-05-26 NOTE — Progress Notes (Addendum)
Spoke to pt's husband Dr. [name] re SNF placement and he reports they will need to research and visit [facility] before making a decision. Pt's husband is requesting PT re-eval pt for CIR as this is their preference for post-acute rehab. [employer] auth for SNF expires today. Will need to submit new auth if pt's husband becomes agreeable to SNF. MD updated.   Wandra Feinstein, MSW, LCSW [phone] (coverage)

## 2022-05-26 NOTE — Inpatient Diabetes Management (Signed)
Inpatient Diabetes Program Recommendations  AACE/ADA: New Consensus Statement on Inpatient Glycemic Control (2015)  Target Ranges:  Prepandial:   less than 140 mg/dL      Peak postprandial:   less than 180 mg/dL (1-2 hours)      Critically ill patients:  140 - 180 mg/dL   Lab Results  Component Value Date   GLUCAP 172 (H) 05/26/2022   HGBA1C 5.9 (H) 04/15/2022    Review of Glycemic Control  Latest Reference Range & Units 05/24/22 07:48 05/24/22 11:08 05/24/22 16:27 05/24/22 21:58 05/25/22 07:57 05/25/22 11:58 05/25/22 22:47 05/26/22 07:52  Glucose-Capillary 70 - 99 mg/dL 173 (H) 249 (H) 219 (H) 289 (H) 238 (H) 275 (H) 279 (H) 172 (H)   Diabetes history: DM 2 Outpatient Diabetes medications: Fiasp 6-16 units tid, Lantus 13 units Daily Current orders for Inpatient glycemic control:  Semglee 6 units bid Novolog 0-9 units tid  Novolog 3 units tid meal coverage  Nepro bid between meals  Inpatient Diabetes Program Recommendations:    -  Increase Novolog meal coverage to 4 units tid  Thanks,  Tama Headings RN, MSN, BC-ADM Inpatient Diabetes Coordinator Team Pager [phone] (8a-5p)

## 2022-05-26 NOTE — Progress Notes (Signed)
Occupational Therapy Treatment Patient Details Name: Carolyn Russo MRN: [id number] DOB: [birth year] Today's Date: 05/26/2022   History of present illness 71 y/o female presented to ED on 05/10/22 for R foot infection after 1st ray amputation x 1 month ago. S/p R BKA on 7/26. PMH: diabetes, hypertension, hyperlipidemia, ESRD on HD MWF, hx of CVA 03/2021   OT comments  Pt in bed upon therapy arrival alert and awake with Son and his wife visiting. Son provided therapist's with an update on patient's overall hospitalization and change in mentality. Session's goal to work on sitting on EOB and focus on core strength and activity tolerance. Pt requested the bedpan and therapist's assisted with placement. Pt quickly became fatigued and alertness and arousal level diminished. Pt was unable to continue with therapy session once removed from bedpan. Son was informed of patient's progress during session. D/c recommendation remains appropriate. OT will continue to follow patient acutely.    Recommendations for follow up therapy are one component of a multi-disciplinary discharge planning process, led by the attending physician.  Recommendations may be updated based on patient status, additional functional criteria and insurance authorization.    Follow Up Recommendations  Skilled nursing-short term rehab (<3 hours/day)    Assistance Recommended at Discharge Frequent or constant Supervision/Assistance  Patient can return home with the following  Two people to help with bathing/dressing/bathroom;Two people to help with walking and/or transfers;Assistance with feeding;Assistance with cooking/housework;Assist for transportation;Help with stairs or ramp for entrance;Direct supervision/assist for financial management;Direct supervision/assist for medications management         Precautions / Restrictions Precautions Precautions: Fall Precaution Comments: limb protector Restrictions Weight Bearing Restrictions:  Yes RLE Weight Bearing: Non weight bearing              ADL either performed or assessed with clinical judgement   ADL Overall ADL's : Needs assistance/impaired Eating/Feeding: Total assistance;Bed level                   Lower Body Dressing: Total assistance;Bed level Lower Body Dressing Details (indicate cue type and reason): donning hospital gown         Tub/ Shower Transfer: Total assistance Tub/Shower Transfer Details (indicate cue type and reason): bed level - used bed pan        Extremity/Trunk Assessment Upper Extremity Assessment Upper Extremity Assessment:  (VC provided to pt to lift BUE to assist with placement during donning new hospital gown.)            Vision   Additional Comments: Pt was able track PT during session.          Cognition Arousal/Alertness:  (Awake/alert upon therapy arrival. Shortly after starting session, pt became lethargic.) Behavior During Therapy: Flat affect Overall Cognitive Status: Impaired/Different from baseline                      Pertinent Vitals/ Pain       Pain Assessment Pain Assessment: Faces Faces Pain Scale: Hurts a little bit Pain Location: RLE when moved during bed mobility. Pain Intervention(s): Monitored during session, Limited activity within patient's tolerance, Repositioned         Frequency  Min 2X/week        Progress Toward Goals  OT Goals(current goals can now be found in the care plan section)  Progress towards OT goals: Progressing toward goals     Plan Discharge plan remains appropriate;Frequency remains appropriate    Co-evaluation  Reason for Co-Treatment: For patient/therapist safety;Necessary to address cognition/behavior during functional activity;To address functional/ADL transfers PT goals addressed during session: Mobility/safety with mobility        AM-PAC OT "6 Clicks" Daily Activity     Outcome Measure   Help from another person eating meals?:  Total Help from another person taking care of personal grooming?: Total Help from another person toileting, which includes using toliet, bedpan, or urinal?: Total Help from another person bathing (including washing, rinsing, drying)?: Total Help from another person to put on and taking off regular upper body clothing?: Total Help from another person to put on and taking off regular lower body clothing?: Total 6 Click Score: 6    End of Session    OT Visit Diagnosis: Unsteadiness on feet (R26.81);Muscle weakness (generalized) (M62.81)   Activity Tolerance Patient tolerated treatment well;Patient limited by fatigue   Patient Left in bed;with call bell/phone within reach;with bed alarm set;with family/visitor present   Nurse Communication Mobility status        Time: 1430-1510 OT Time Calculation (min): 40 min  Charges: OT General Charges $OT Visit: 1 Visit OT Treatments $Therapeutic Activity: 8-22 mins Ailene Ravel, OTR/L,CBIS  Supplemental OT - [facility] and [facility]   Joren Rehm, Clarene Duke 05/26/2022, 5:16 PM

## 2022-05-27 ENCOUNTER — Inpatient Hospital Stay (HOSPITAL_COMMUNITY): Payer: Medicare PPO

## 2022-05-27 DIAGNOSIS — D72829 Elevated white blood cell count, unspecified: Secondary | ICD-10-CM | POA: Diagnosis not present

## 2022-05-27 DIAGNOSIS — S88111A Complete traumatic amputation at level between knee and ankle, right lower leg, initial encounter: Secondary | ICD-10-CM | POA: Diagnosis not present

## 2022-05-27 DIAGNOSIS — I96 Gangrene, not elsewhere classified: Secondary | ICD-10-CM | POA: Diagnosis not present

## 2022-05-27 DIAGNOSIS — I6381 Other cerebral infarction due to occlusion or stenosis of small artery: Secondary | ICD-10-CM | POA: Diagnosis not present

## 2022-05-27 LAB — CBC
HCT: 28.7 % — ABNORMAL LOW (ref 36.0–46.0)
Hemoglobin: 9.5 g/dL — ABNORMAL LOW (ref 12.0–15.0)
MCH: 29.4 pg (ref 26.0–34.0)
MCHC: 33.1 g/dL (ref 30.0–36.0)
MCV: 88.9 fL (ref 80.0–100.0)
Platelets: 360 10*3/uL (ref 150–400)
RBC: 3.23 MIL/uL — ABNORMAL LOW (ref 3.87–5.11)
RDW: 22.4 % — ABNORMAL HIGH (ref 11.5–15.5)
WBC: 28.8 10*3/uL — ABNORMAL HIGH (ref 4.0–10.5)
nRBC: 3.2 % — ABNORMAL HIGH (ref 0.0–0.2)

## 2022-05-27 LAB — GLUCOSE, CAPILLARY
Glucose-Capillary: 167 mg/dL — ABNORMAL HIGH (ref 70–99)
Glucose-Capillary: 143 mg/dL — ABNORMAL HIGH (ref 70–99)
Glucose-Capillary: 146 mg/dL — ABNORMAL HIGH (ref 70–99)
Glucose-Capillary: 151 mg/dL — ABNORMAL HIGH (ref 70–99)

## 2022-05-27 LAB — BASIC METABOLIC PANEL
Anion gap: 16 — ABNORMAL HIGH (ref 5–15)
BUN: 46 mg/dL — ABNORMAL HIGH (ref 8–23)
CO2: 23 mmol/L (ref 22–32)
Calcium: 9.5 mg/dL (ref 8.9–10.3)
Chloride: 93 mmol/L — ABNORMAL LOW (ref 98–111)
Creatinine, Ser: 6.36 mg/dL — ABNORMAL HIGH (ref 0.44–1.00)
GFR, Estimated: 6 mL/min — ABNORMAL LOW (ref >60)
Glucose, Bld: 128 mg/dL — ABNORMAL HIGH (ref 70–99)
Potassium: 3.9 mmol/L (ref 3.5–5.1)
Sodium: 132 mmol/L — ABNORMAL LOW (ref 135–145)

## 2022-05-27 LAB — PROCALCITONIN: Procalcitonin: 1.49 ng/mL

## 2022-05-27 MED ORDER — ANTICOAGULANT SODIUM CITRATE 4% (200MG/5ML) IV SOLN: 5.0000 mL | Status: DC | PRN

## 2022-05-27 MED ORDER — PENTAFLUOROPROP-TETRAFLUOROETH EX AERO: 1.0000 | INHALATION_SPRAY | CUTANEOUS | Status: DC | PRN

## 2022-05-27 MED ORDER — LIDOCAINE HCL (PF) 1 % IJ SOLN: 5.0000 mL | INTRAMUSCULAR | Status: DC | PRN

## 2022-05-27 MED ORDER — HEPARIN SODIUM (PORCINE) 1000 UNIT/ML IJ SOLN
INTRAMUSCULAR | Status: AC
  Administered 2022-05-27: 3000 [IU]
  Filled 2022-05-27: qty 3

## 2022-05-27 MED ORDER — ALTEPLASE 2 MG IJ SOLR: 2.0000 mg | Freq: Once | INTRAMUSCULAR | Status: DC | PRN

## 2022-05-27 MED ORDER — ROSUVASTATIN CALCIUM 5 MG PO TABS
10.0000 mg | ORAL_TABLET | Freq: Every day | ORAL | Status: DC
  Administered 2022-05-27 – 2022-06-09 (×13): 10 mg via ORAL
  Filled 2022-05-27 (×16): qty 2

## 2022-05-27 MED ORDER — LIDOCAINE-PRILOCAINE 2.5-2.5 % EX CREA: 1.0000 | TOPICAL_CREAM | CUTANEOUS | Status: DC | PRN

## 2022-05-27 MED ORDER — ACETAMINOPHEN 500 MG PO TABS: 1000.0000 mg | ORAL_TABLET | Freq: Once | ORAL | Status: DC

## 2022-05-27 NOTE — Progress Notes (Signed)
PROGRESS NOTE    Carolyn Russo  DPO:[id number] DOB: Dec 12, 1950 DOA: 05/10/2022 PCP: [name], MD   Brief Narrative: Carolyn Russo is a 71 y.o. female with a history of ESRD on HD, CVA with left hemiplegia, seizures, diabetes, hypertension, hyperlipidemia, chronic systolic heart failure, PAD, GI bleed. Patient presented secondary to worsening right foot infection, found to have gangrene. Empiric antibiotics initiated and patient underwent a right BKA.   Assessment and Plan:  Gangrene of right foot Empiric Vancomycin and Cefepime initiated on admission. Vascular and orthopedic surgery consulted. Patient underwent right BKA on 7/26 with subsequent discontinuation of antibiotics. PT/OT consulted with recommendations for SNF, however family is hoping for possible discharge to CIR.  PAD Contributed to above. -Continue Plavix  Acute metabolic encephalopathy Unsure of etiology. Workup without specific/reversible etiology identified. Possibly related to medication effect. Patient with slow but positive recovery of mental status function. May need MRI brain.  Leukocytosis Unknown etiology. Possibly related to steroid use. Peak WBC of 38,800. Trended down but has slightly risen -CBC in AM  Chest pain Left sided, achy. Reproducible. Atypical. No associated dyspnea. -Chest x-ray to rule out fracture or pneumonia/effusion -EKG  History of CVA Resultant left hemiplegia. Stable.  ESRD on HD Nephrology consulted. Patient on MWF schedule for HD.  Chronic anemia Anemia of chronic disease Stable.  Diabetes mellitus, type 2 Patient is on Lantus 12 units daily as an outpatient. Fasting blood sugar of 167 mg/dL this morning. -Continue Semglee 6 units BID; may need to increase dose -Continue Novolog 3 units TID with meals and SSI  Obesity Body mass index is 28.69 kg/m. Although BMI is less than 30 kg/m, this is secondary to   DVT prophylaxis: Heparin subq Code Status:   Code  Status: Full Code Family Communication: Husband at bedside Disposition Plan: Discharge to CIR vs SNF; at this time discharge is leaning more towards SNF as patient has been unable to mobilize well enough with PT. Anticipate medically stable for discharge in 1-3 days if mental status remains stable, pending chest pain workup, and further leukocytosis workup   Consultants:  Nephrology Orthopedic surgery Vascular surgery  Procedures:  Right BKA (7/26)  Antimicrobials: Vancomycin Cefepime    Subjective: Patient states she has left sided, non-radiating, achy chest pain. She reports this pain has been present for the past week. No associated symptoms but has been nauseated since starting hemodialysis.   Objective: BP (!) 128/46 (BP Location: Right Leg)   Pulse 60   Temp (!) 97.5 F (36.4 C) (Oral)   Resp 18   Ht '5\' 5"'$  (1.651 m)   Wt 78.2 kg   SpO2 99%   BMI 28.69 kg/m   Examination:  General exam: Appears calm and comfortable Respiratory system: Clear to auscultation. Respiratory effort normal. Cardiovascular system: S1 & S2 heard, RRR. Gastrointestinal system: Abdomen is nondistended, soft and nontender. Normal bowel sounds heard. Central nervous system: Alert and oriented. Slow to respond to questions Musculoskeletal: No calf tenderness. Left chest is tender below breast. Skin: No cyanosis. No rashes   Data Reviewed: I have personally reviewed following labs and imaging studies  CBC Lab Results  Component Value Date   WBC 28.8 (H) 05/27/2022   RBC 3.23 (L) 05/27/2022   HGB 9.5 (L) 05/27/2022   HCT 28.7 (L) 05/27/2022   MCV 88.9 05/27/2022   MCH 29.4 05/27/2022   PLT 360 05/27/2022   MCHC 33.1 05/27/2022   RDW 22.4 (H) 05/27/2022   LYMPHSABS 2.7 05/23/2022  MONOABS 2.2 (H) 05/23/2022   EOSABS 0.0 05/23/2022   BASOSABS 0.1 38/18/2993     Last metabolic panel Lab Results  Component Value Date   NA 132 (L) 05/27/2022   K 3.9 05/27/2022   CL 93 (L)  05/27/2022   CO2 23 05/27/2022   BUN 46 (H) 05/27/2022   CREATININE 6.36 (H) 05/27/2022   GLUCOSE 128 (H) 05/27/2022   GFRNONAA 6 (L) 05/27/2022   GFRAA 17 06/19/2020   CALCIUM 9.5 05/27/2022   PHOS 3.7 05/24/2022   PROT 7.7 05/22/2022   ALBUMIN 2.5 (L) 05/24/2022   LABGLOB 3.2 04/13/2022   AGRATIO 1.2 04/13/2022   BILITOT 1.1 05/22/2022   ALKPHOS 67 05/22/2022   AST 52 (H) 05/22/2022   ALT 61 (H) 05/22/2022   ANIONGAP 16 (H) 05/27/2022    GFR: Estimated Creatinine Clearance: 8.1 mL/min (A) (by C-G formula based on SCr of 6.36 mg/dL (H)).  Recent Results (from the past 240 hour(s))  Surgical pcr screen     Status: None   Collection Time: 05/17/22  6:45 PM   Specimen: Nasal Mucosa; Nasal Swab  Result Value Ref Range Status   MRSA, PCR NEGATIVE NEGATIVE Final   Staphylococcus aureus NEGATIVE NEGATIVE Final    Comment: (NOTE) The Xpert SA Assay (FDA approved for NASAL specimens in patients 109 years of age and older), is one component of a comprehensive surveillance program. It is not intended to diagnose infection nor to guide or monitor treatment. Performed at [facility], [place] [address] [place], [place] [address]   Urine Culture     Status: None   Collection Time: 05/20/22  2:13 PM   Specimen: In/Out Cath Urine  Result Value Ref Range Status   Specimen Description IN/OUT CATH URINE  Final   Special Requests NONE  Final   Culture   Final    NO GROWTH Performed at [facility], [place] [address] [place], [place] [address]    Report Status 05/21/2022 FINAL  Final  Culture, blood (Routine X 2) w Reflex to ID Panel     Status: None   Collection Time: 05/20/22  2:55 PM   Specimen: BLOOD  Result Value Ref Range Status   Specimen Description BLOOD BLOOD LEFT HAND  Final   Special Requests   Final    BOTTLES DRAWN AEROBIC AND ANAEROBIC Blood Culture adequate volume   Culture   Final    NO GROWTH 5 DAYS Performed at [facility], [place] [address],  [place], [place] [address]    Report Status 05/25/2022 FINAL  Final  Culture, blood (Routine X 2) w Reflex to ID Panel     Status: None   Collection Time: 05/20/22  3:05 PM   Specimen: BLOOD  Result Value Ref Range Status   Specimen Description BLOOD BLOOD LEFT HAND  Final   Special Requests   Final    BOTTLES DRAWN AEROBIC AND ANAEROBIC Blood Culture adequate volume   Culture   Final    NO GROWTH 5 DAYS Performed at [facility], [place] [address], [place], [place] [address]    Report Status 05/25/2022 FINAL  Final      Radiology Studies: No results found.    LOS: 17 days    Cordelia Poche, MD [facility] 05/27/2022, 7:34 AM   If 7PM-7AM, please contact night-coverage [email or web address]

## 2022-05-27 NOTE — Progress Notes (Addendum)
[facility] Progress Note   Subjective:  Seen at start of HD - 3L UFG planned. Confused, but awake. Cannot tell me the date. Denies CP/dyspnea, but breathing a little heavy at the moment.  Objective Vitals:   05/26/22 1534 05/26/22 2049 05/26/22 2100 05/27/22 0800  BP: 119/88 (!) 128/46  122/62  Pulse: 79 75 60 80  Resp: '18 16 18 19  '$ Temp: 98.6 F (37 C) (!) 97.5 F (36.4 C)  98 F (36.7 C)  TempSrc: Oral Oral  Oral  SpO2: 100% 100% 99% 98%  Weight:      Height:       Physical Exam General: Chronically ill appearing woman, room air. Heart: RRR; no murmur Lungs: Poor inspiratory effort, no rales or wheezing  Abdomen: soft Extremities: No LLE edema; R leg in large brace Dialysis Access: TDC in R chest  Additional Objective Labs: Basic Metabolic Panel: Recent Labs  Lab 05/22/22 0204 05/23/22 0133 05/23/22 0711 05/24/22 0555 05/25/22 0909 05/27/22 0235  NA 131* 128*   < > 132* 128* 132*  K 4.8 5.1   < > 3.6 3.9 3.9  CL 91* 88*   < > 94* 90* 93*  CO2 23 22   < > '26 24 23  '$ GLUCOSE 444* 551*   < > 194* 213* 128*  BUN 61* 90*   < > 40* 66* 46*  CREATININE 7.87* 9.65*   < > 5.08* 7.44* 6.36*  CALCIUM 9.8 9.9   < > 9.1 9.5 9.5  PHOS 6.5* 5.4*  --  3.7  --   --    < > = values in this interval not displayed.   Liver Function Tests: Recent Labs  Lab 05/22/22 0204 05/23/22 0133 05/24/22 0555  AST 52*  --   --   ALT 61*  --   --   ALKPHOS 67  --   --   BILITOT 1.1  --   --   PROT 7.7  --   --   ALBUMIN 2.5* 2.5* 2.5*   CBC: Recent Labs  Lab 05/21/22 0149 05/22/22 0204 05/23/22 0133 05/25/22 0909 05/27/22 0235  WBC 38.8* 31.6* 32.0* 24.1* 28.8*  NEUTROABS 28.6* 27.8* 26.3*  --   --   HGB 9.2* 8.8* 8.7* 9.2* 9.5*  HCT 26.9* 26.2* 25.3* 26.9* 28.7*  MCV 85.1 84.8 84.6 85.7 88.9  PLT 535* 525* 521* 430* 360   Medications:  anticoagulant sodium citrate     anticoagulant sodium citrate     ferric gluconate (FERRLECIT) IVPB Stopped  (05/25/22 1830)   magnesium sulfate bolus IVPB      (feeding supplement) PROSource Plus  30 mL Oral BID BM   acetaminophen  1,000 mg Oral Q8H   acetaminophen  1,000 mg Oral Once   vitamin C  1,000 mg Oral Daily   carvedilol  3.125 mg Oral BID WC   Chlorhexidine Gluconate Cloth  6 each Topical Q0600   clopidogrel  75 mg Oral Daily   darbepoetin (ARANESP) injection - DIALYSIS  200 mcg Intravenous Q Fri-HD   docusate sodium  100 mg Oral Daily   feeding supplement (NEPRO CARB STEADY)  237 mL Oral BID BM   heparin injection (subcutaneous)  5,000 Units Subcutaneous Q8H   hydrocortisone   Rectal BID   insulin aspart  0-9 Units Subcutaneous TID WC   insulin aspart  3 Units Subcutaneous TID WC   insulin glargine-yfgn  6 Units Subcutaneous BID   levETIRAcetam  500 mg Oral  QHS   losartan  100 mg Oral Daily   methylphenidate  5 mg Oral BID WC   mometasone-formoterol  2 puff Inhalation BID   nutrition supplement (JUVEN)  1 packet Oral BID BM   pantoprazole  40 mg Oral Daily   rosuvastatin  20 mg Oral Daily   sevelamer carbonate  0.8 g Oral TID WC   zinc sulfate  220 mg Oral Daily    Dialysis Orders: MWF - [facility] 4 hrs 180NRE 350/700 88.5 kg 2.0 K/2.0 Ca UFP 4 No heparin - Mircera 200 mcg  IV q 2wks - last 04/29/22 - Venofer '100mg'$  IV X 5 doses-new order, hasn't been given yet - Hectorol 69mg IV TIW  Assessment/Plan: Gangrene R Foot s/p R BKA on 05/18/2022 per Dr. DSharol Given Off abx at this time Acute Metabolic Encephalopathy/Intermittent Confusion: Seroquel stopped, minimizing sedatives. CT head negative. Cefepime discontinued. Waxing and waning. Managed by primary. Leukocytosis: ID consulted, they felt likely due to steroids - now off. Slowly trending down, albeit still very high. Infectious work-up negative. ESRD: Continue HD on usual MWF schedule - HD today. Anemia of ESRD: Hgb 9.5 - improving with weekly Aranesp (2017m q Fri) and IV iron.  Secondary hyperparathyroidism :  COrrCa high, VDRA on hold. Continue Renvela as binder. HTN/volume: BP controlled, lowering volume as tolerated - 3L UFG today. Will have much lower EDW on discharge (due to amputation).   Nutrition - Very low albumin, continue protein supplements.  T2DM: Per primary. Hx CVA: On dual ASA/plavix, and Keppra.    Carolyn PentonPA-C 05/27/2022, 9:01 AM  CaRoyalNephrology attending: Seen and examined in HD unit. I agree with above.  She is more alert and awake. BP soft. SBP 99. I will lower UF goal to 1 kg only. Not eating much. D/w husband and HD nurses. Awaiting safe discharge to SNF.  D.Katheran James CKA

## 2022-05-27 NOTE — Plan of Care (Signed)
  Problem: Education: Goal: Ability to describe self-care measures that may prevent or decrease complications (Diabetes Survival Skills Education) will improve Outcome: Progressing   Problem: Skin Integrity: Goal: Risk for impaired skin integrity will decrease Outcome: Progressing   Problem: Tissue Perfusion: Goal: Adequacy of tissue perfusion will improve Outcome: Progressing   Problem: Activity: Goal: Risk for activity intolerance will decrease Outcome: Progressing   Problem: Coping: Goal: Level of anxiety will decrease Outcome: Progressing   Problem: Elimination: Goal: Will not experience complications related to bowel motility Outcome: Progressing   Problem: Pain Managment: Goal: General experience of comfort will improve Outcome: Progressing   Problem: Safety: Goal: Ability to remain free from injury will improve Outcome: Progressing

## 2022-05-27 NOTE — Progress Notes (Signed)
OT Cancellation Note  Patient Details Name: Carolyn Russo MRN: [id number] DOB: 01/21/1951   Cancelled Treatment:    Reason Eval/Treat Not Completed: Patient at procedure or test/ unavailable. Pt unavailable receiving dialysis when attempted to see patient for OT therapy session.   Ailene Ravel, OTR/L,CBIS  Supplemental OT - [facility] and Dirk Dress  05/27/2022, 1:44 PM

## 2022-05-27 NOTE — Progress Notes (Signed)
PT returned to the floor had a Lg BM and was given a complete bed bath. She was feeling nauseous so Zofan was given and some of her Nepro. PT has been resting with bed alarm on bed at lowest and call bell in reach.  Pt BP was soft while at HD, when she got back to the floor her BP was 102/76. Nurse Talk to Mariel Aloe, MD about given BP meds. MD decided to hold for now and ask that we cont to monitor.

## 2022-05-27 NOTE — Progress Notes (Signed)
Dr Teryl Lucy at bedside. Pt appears in no distress, pt reports chest pain to md of which she locates under lt breast and describes as "aching" This rn informed md of pt with frequent report of nausea, pt also desires and tolerating small sips of nepro

## 2022-05-27 NOTE — Progress Notes (Signed)
Spoke to pt's husband, Dr. [name], re PT continued recommendation for SNF after visit yesterday. Pt's husband reports family is planning to visit [facility] today or over weekend, but they are still hopeful pt will be able to tolerate CIR level of care by Monday.   [employer] auth SNF (currently for [facility] but can change facility if needed) expires Sunday 8/6 (ref # [id number]). If pt does not dc over weekend, will need to submit new auth. SW will follow and assist as indicated.   Wandra Feinstein, MSW, LCSW [phone] (coverage)

## 2022-05-28 DIAGNOSIS — I96 Gangrene, not elsewhere classified: Secondary | ICD-10-CM | POA: Diagnosis not present

## 2022-05-28 DIAGNOSIS — S88111A Complete traumatic amputation at level between knee and ankle, right lower leg, initial encounter: Secondary | ICD-10-CM | POA: Diagnosis not present

## 2022-05-28 DIAGNOSIS — D72829 Elevated white blood cell count, unspecified: Secondary | ICD-10-CM | POA: Diagnosis not present

## 2022-05-28 DIAGNOSIS — I6381 Other cerebral infarction due to occlusion or stenosis of small artery: Secondary | ICD-10-CM | POA: Diagnosis not present

## 2022-05-28 LAB — GLUCOSE, CAPILLARY
Glucose-Capillary: 149 mg/dL — ABNORMAL HIGH (ref 70–99)
Glucose-Capillary: 303 mg/dL — ABNORMAL HIGH (ref 70–99)
Glucose-Capillary: 291 mg/dL — ABNORMAL HIGH (ref 70–99)
Glucose-Capillary: 146 mg/dL — ABNORMAL HIGH (ref 70–99)

## 2022-05-28 LAB — CBC
HCT: 29.1 % — ABNORMAL LOW (ref 36.0–46.0)
Hemoglobin: 9.7 g/dL — ABNORMAL LOW (ref 12.0–15.0)
MCH: 29.9 pg (ref 26.0–34.0)
MCHC: 33.3 g/dL (ref 30.0–36.0)
MCV: 89.8 fL (ref 80.0–100.0)
Platelets: 325 10*3/uL (ref 150–400)
RBC: 3.24 MIL/uL — ABNORMAL LOW (ref 3.87–5.11)
RDW: 23.4 % — ABNORMAL HIGH (ref 11.5–15.5)
WBC: 35.1 10*3/uL — ABNORMAL HIGH (ref 4.0–10.5)
nRBC: 2.8 % — ABNORMAL HIGH (ref 0.0–0.2)

## 2022-05-28 LAB — PROCALCITONIN: Procalcitonin: 2.27 ng/mL

## 2022-05-28 MED ORDER — VANCOMYCIN HCL 750 MG/150ML IV SOLN
750.0000 mg | INTRAVENOUS | Status: DC
  Administered 2022-06-01 – 2022-06-03 (×2): 750 mg via INTRAVENOUS
  Filled 2022-05-28 (×4): qty 150

## 2022-05-28 MED ORDER — SODIUM CHLORIDE 0.9 % IV SOLN
1.0000 g | INTRAVENOUS | Status: DC
  Administered 2022-05-28 – 2022-05-30 (×3): 1 g via INTRAVENOUS
  Filled 2022-05-28 (×6): qty 10

## 2022-05-28 MED ORDER — OXYCODONE HCL 5 MG PO TABS
2.5000 mg | ORAL_TABLET | ORAL | Status: DC | PRN
  Administered 2022-06-04 – 2022-06-08 (×2): 2.5 mg via ORAL
  Filled 2022-05-28 (×2): qty 1

## 2022-05-28 MED ORDER — VANCOMYCIN HCL 1250 MG/250ML IV SOLN
1250.0000 mg | Freq: Once | INTRAVENOUS | Status: AC
  Administered 2022-05-28: 1250 mg via INTRAVENOUS
  Filled 2022-05-28: qty 250

## 2022-05-28 NOTE — Plan of Care (Signed)
  Problem: Health Behavior/Discharge Planning: Goal: Ability to identify and utilize available resources and services will improve Outcome: Progressing Goal: Ability to manage health-related needs will improve Outcome: Progressing   Problem: Nutritional: Goal: Maintenance of adequate nutrition will improve Outcome: Progressing Goal: Progress toward achieving an optimal weight will improve Outcome: Progressing   Problem: Skin Integrity: Goal: Risk for impaired skin integrity will decrease Outcome: Progressing   Problem: Activity: Goal: Risk for activity intolerance will decrease Outcome: Progressing   Problem: Pain Managment: Goal: General experience of comfort will improve Outcome: Progressing   Problem: Safety: Goal: Ability to remain free from injury will improve Outcome: Progressing

## 2022-05-28 NOTE — Progress Notes (Signed)
Patient ID: Carolyn Russo, female   DOB: Feb 22, 1951, 71 y.o.   MRN: [id number] Patient resting comfortably this morning.  Significant increase in her white cell count 35.1.  At the time of surgery the amputation site had good contractile muscle no signs of infection.  However with the significant increase of white cell count I will have the nurses remove the wound VAC dressing and apply a dry dressing so the leg can be evaluated.

## 2022-05-28 NOTE — Progress Notes (Addendum)
Carolyn Russo Progress Note   Subjective:   Seen in room - c/o leg pain. No CP/dyspnea. Did fine with dialysis yesterday, only 1L net UF d/t hypotension.  Objective Vitals:   05/27/22 2132 05/28/22 0534 05/28/22 0715 05/28/22 0906  BP: (!) 116/48 (!) 158/52  136/70  Pulse: 81 79  77  Resp: 16   16  Temp: 98.3 F (36.8 C) 98.7 F (37.1 C)    TempSrc: Axillary Oral    SpO2: 98% 90% 92% 97%  Weight:      Height:       Physical Exam General: Chronically ill appearing woman, room air. Heart: RRR; no murmur Lungs: Poor inspiratory effort, no rales or wheezing  Abdomen: soft Extremities: No LLE edema; R leg in large brace Dialysis Access: TDC in R chest  Additional Objective Labs: Basic Metabolic Panel: Recent Labs  Lab 05/22/22 0204 05/23/22 0133 05/23/22 0711 05/24/22 0555 05/25/22 0909 05/27/22 0235  NA 131* 128*   < > 132* 128* 132*  K 4.8 5.1   < > 3.6 3.9 3.9  CL 91* 88*   < > 94* 90* 93*  CO2 23 22   < > '26 24 23  '$ GLUCOSE 444* 551*   < > 194* 213* 128*  BUN 61* 90*   < > 40* 66* 46*  CREATININE 7.87* 9.65*   < > 5.08* 7.44* 6.36*  CALCIUM 9.8 9.9   < > 9.1 9.5 9.5  PHOS 6.5* 5.4*  --  3.7  --   --    < > = values in this interval not displayed.   Liver Function Tests: Recent Labs  Lab 05/22/22 0204 05/23/22 0133 05/24/22 0555  AST 52*  --   --   ALT 61*  --   --   ALKPHOS 67  --   --   BILITOT 1.1  --   --   PROT 7.7  --   --   ALBUMIN 2.5* 2.5* 2.5*   CBC: Recent Labs  Lab 05/22/22 0204 05/23/22 0133 05/25/22 0909 05/27/22 0235 05/28/22 0130  WBC 31.6* 32.0* 24.1* 28.8* 35.1*  NEUTROABS 27.8* 26.3*  --   --   --   HGB 8.8* 8.7* 9.2* 9.5* 9.7*  HCT 26.2* 25.3* 26.9* 28.7* 29.1*  MCV 84.8 84.6 85.7 88.9 89.8  PLT 525* 521* 430* 360 325   Blood Culture    Component Value Date/Time   SDES BLOOD BLOOD LEFT HAND 05/20/2022 1505   SPECREQUEST  05/20/2022 1505    BOTTLES DRAWN AEROBIC AND ANAEROBIC Blood Culture adequate volume    CULT  05/20/2022 1505    NO GROWTH 5 DAYS Performed at [facility], [place] [address] [place], [place] [address]    REPTSTATUS 05/25/2022 FINAL 05/20/2022 1505   Studies/Results: DG CHEST PORT 1 VIEW  Result Date: 05/27/2022 CLINICAL DATA:  Chest pain EXAM: PORTABLE CHEST 1 VIEW COMPARISON:  Previous studies including the examination of 05/16/2022 FINDINGS: Transverse diameter of the heart is increased. There are no signs of pulmonary edema. There is interval decrease in pulmonary vascular congestion. There is no focal pulmonary consolidation. Small linear densities are seen in the lower lung fields. There is no pleural effusion or pneumothorax. Tip of dialysis catheter is seen in the region of right atrium. IMPRESSION: There are no signs of pulmonary edema or focal pulmonary consolidation. Small linear densities in the lower lung fields suggest minimal scarring or subsegmental atelectasis. Electronically Signed   By: Royston Cowper  Rathinasamy M.D.   On: 05/27/2022 14:57    Medications:  ferric gluconate (FERRLECIT) IVPB Stopped (05/27/22 1347)   magnesium sulfate bolus IVPB      (feeding supplement) PROSource Plus  30 mL Oral BID BM   acetaminophen  1,000 mg Oral Q8H   vitamin C  1,000 mg Oral Daily   carvedilol  3.125 mg Oral BID WC   Chlorhexidine Gluconate Cloth  6 each Topical Q0600   clopidogrel  75 mg Oral Daily   darbepoetin (ARANESP) injection - DIALYSIS  200 mcg Intravenous Q Fri-HD   docusate sodium  100 mg Oral Daily   feeding supplement (NEPRO CARB STEADY)  237 mL Oral BID BM   heparin injection (subcutaneous)  5,000 Units Subcutaneous Q8H   hydrocortisone   Rectal BID   insulin aspart  0-9 Units Subcutaneous TID WC   insulin aspart  3 Units Subcutaneous TID WC   insulin glargine-yfgn  6 Units Subcutaneous BID   levETIRAcetam  500 mg Oral QHS   losartan  100 mg Oral Daily   methylphenidate  5 mg Oral BID WC   mometasone-formoterol  2 puff Inhalation BID   nutrition  supplement (JUVEN)  1 packet Oral BID BM   pantoprazole  40 mg Oral Daily   rosuvastatin  10 mg Oral Daily   sevelamer carbonate  0.8 g Oral TID WC   zinc sulfate  220 mg Oral Daily    Dialysis Orders: MWF - [facility] 4 hrs 180NRE 350/700 88.5 kg 2.0 K/2.0 Ca UFP 4 No heparin - Mircera 200 mcg  IV q 2wks - last 04/29/22 - Venofer '100mg'$  IV X 5 doses-new order, hasn't been given yet - Hectorol 74mg IV TIW   Assessment/Plan: Gangrene R Foot s/p R BKA on 05/18/2022 per Dr. DSharol Given Off abx at this time, although WBC rising again and she is having significant pain. Wound vac being removed, per ortho note. Acute Metabolic Encephalopathy/Intermittent Confusion: Seroquel stopped, minimizing sedatives. CT head negative. Cefepime discontinued. Waxing and waning. Managed by primary. Leukocytosis: ID consulted, they felt likely due to steroids - now off. Was coming down, now up again. Per primary and ortho teams. ESRD: Continue HD on usual MWF schedule - next HD 8/7. Anemia of ESRD: Hgb 9.7- improving with weekly Aranesp (20100m q Fri). Was on IV iron, stopping now b/c appears to have ongoing infection. Secondary hyperparathyroidism : COrrCa high, VDRA on hold. Continue Renvela as binder. HTN/volume: BP controlled, lowering volume as tolerated - only 1L UF with last HD d/t hypotension. Nutrition - Very low albumin, continue protein supplements.  T2DM: Per primary. Hx CVA: On dual ASA/plavix, and Keppra.  KaVeneta PentonPA-C 05/28/2022, 10:01 AM  CaCadeNephrology attending: I have personally seen and examined the patient.  Chart reviewed.  I agree with above.  Only 0.6 L of ultrafiltration yesterday because of intradialytic hypotension.  Volume status acceptable.  She was alert awake this morning.  Noted worsening leukocytosis therefore orthopedics is planning to remove the wound VAC dressing and apply dry dressing.  Next HD on Monday.  D.Le Floreidney  Russo

## 2022-05-28 NOTE — Progress Notes (Signed)
Pharmacy Antibiotic Note  [name] is a 71 y.o. 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 On 8/5 had worsening leukocytosis, possible surgical site infection.  Pharmacy has been consulted for vancomycin and cefepime dosing. Patient is on HD MWF.  WBC up to 35. Afebrile. Continues on HD.   Plan: Vancomycin '1250mg'$  x1 load then '750mg'$  MWF post HD Cefepime 1g q24 hr  Monitor cultures, clinical status, renal function, vancomycin level Narrow abx as able and f/u duration    Height: '5\' 5"'$  (165.1 cm) Weight: 76.5 kg (168 lb 10.4 oz) IBW/kg (Calculated) : 57  Temp (24hrs), Avg:98.7 F (37.1 C), Min:98.3 F (36.8 C), Max:99.2 F (37.3 C)  Recent Labs  Lab 05/22/22 0204 05/23/22 0133 05/23/22 0711 05/24/22 0555 05/25/22 0909 05/27/22 0235 05/28/22 0130  WBC 31.6* 32.0*  --   --  24.1* 28.8* 35.1*  CREATININE 7.87* 9.65* 9.94* 5.08* 7.44* 6.36*  --     Estimated Creatinine Clearance: 8.1 mL/min (A) (by C-G formula based on SCr of 6.36 mg/dL (H)).    Allergies  Allergen Reactions   Food Anaphylaxis    Peanuts; Almonds   Wheat Bran     Upset stomach    Statins Itching and Other (See Comments)    Generalized aches- tolerates crestor    Pork-Derived Products     Does not eat pork    Shellfish Allergy Other (See Comments)    Mouth gets raw   Sitagliptin Other (See Comments)    Januiva - unknown reaction    Tetracycline Other (See Comments)    Raw mouth   Contrast Media [Iodinated Contrast Media] Rash    Happened during CT scan over 30 years ago    Antimicrobials this admission: Cefe 7/18 >> 7/25; 8/5>> Vanc 7/18 >> 7/25; 8/5 >>   Dose adjustments this admission:    Microbiology results: 7/24 BCx: ng 7/28 UCx: ng 7/28 Bcx: ng   8/5 Bcx: pend 7/25 MRSA PCR: neg  Thank you for allowing pharmacy to be a part of this patient's care.  Benetta Spar, PharmD, BCPS, BCCP Clinical Pharmacist  Please check AMION for all [place] phone  numbers After 10:00 PM, call [place] [phone]

## 2022-05-28 NOTE — Progress Notes (Signed)
PROGRESS NOTE    Carolyn SCHLOTZHAUER  [name] DOB: 05-Aug-1951 DOA: 05/10/2022 PCP: [name], MD   Brief Narrative: Carolyn Russo is a 71 y.o. female with a history of ESRD on HD, CVA with left hemiplegia, seizures, diabetes, hypertension, hyperlipidemia, chronic systolic heart failure, PAD, GI bleed. Patient presented secondary to worsening right foot infection, found to have gangrene. Empiric antibiotics initiated and patient underwent a right BKA.   Assessment and Plan:  Gangrene of right foot Empiric Vancomycin and Cefepime initiated on admission. Vascular and orthopedic surgery consulted. Patient underwent right BKA on 7/26 with subsequent discontinuation of antibiotics. PT/OT consulted with recommendations for SNF, however family is hoping for possible discharge to CIR.  PAD Contributed to above. -Continue Plavix  Acute metabolic encephalopathy Unsure of etiology. Workup without specific/reversible etiology identified. Possibly related to medication effect. Patient with slow but positive recovery of mental status function. May need MRI brain.  Leukocytosis Unknown etiology. Possibly related to steroid use. Peak WBC of 38,800. Trended down initially but now significantly elevated again. Procalcitonin is also elevated but in setting of ESRD on HD -CBC daily -Repeat blood cultures -Empiric Vancomycin and Cefepime  Chest pain Left sided, achy. Reproducible. Atypical. No associated dyspnea. EKG without acute ischemic changes. Chest x-ray without acute process. -Supportive care  History of CVA Resultant left hemiplegia. Stable.  ESRD on HD Nephrology consulted. Patient on MWF schedule for HD.  Chronic anemia Anemia of chronic disease Stable.  Diabetes mellitus, type 2 Patient is on Lantus 12 units daily as an outpatient. Fasting blood sugar of 167 mg/dL this morning. -Continue Semglee 6 units BID; may need to increase dose -Continue Novolog 3 units TID with  meals and SSI  Obesity Body mass index is 28.07 kg/m. Although BMI is less than 30 kg/m, this is secondary to   DVT prophylaxis: Heparin subq Code Status:   Code Status: Full Code Family Communication: Husband on telephone Disposition Plan: Discharge to CIR vs SNF; at this time discharge is leaning more towards SNF as patient has been unable to mobilize well enough with PT. Unsure when patient will be medically stable as leukocytosis continues to worsen.   Consultants:  Nephrology Orthopedic surgery Vascular surgery  Procedures:  Right BKA (7/26)  Antimicrobials: Vancomycin Cefepime    Subjective: Patient reports pain of her right BKA stump.   Objective: BP (!) 120/50 (BP Location: Left Arm)   Pulse 68   Temp 98.7 F (37.1 C) (Axillary)   Resp 18   Ht '5\' 5"'$  (1.651 m)   Wt 76.5 kg   SpO2 100%   BMI 28.07 kg/m   Examination:  General exam: Appears calm and comfortable Respiratory system: Clear to auscultation. Respiratory effort normal. Cardiovascular system: S1 & S2 heard, RRR. No murmurs, rubs, gallops or clicks. Gastrointestinal system: Abdomen is nondistended, soft and nontender. Normal bowel sounds heard. Central nervous system: Alert and oriented to person and place. No focal neurological deficits. Musculoskeletal: Right BKA with wound vac attached to stump wound Skin: No cyanosis. No rashes   Data Reviewed: I have personally reviewed following labs and imaging studies  CBC Lab Results  Component Value Date   WBC 35.1 (H) 05/28/2022   RBC 3.24 (L) 05/28/2022   HGB 9.7 (L) 05/28/2022   HCT 29.1 (L) 05/28/2022   MCV 89.8 05/28/2022   MCH 29.9 05/28/2022   PLT 325 05/28/2022   MCHC 33.3 05/28/2022   RDW 23.4 (H) 05/28/2022   LYMPHSABS 2.7 05/23/2022  MONOABS 2.2 (H) 05/23/2022   EOSABS 0.0 05/23/2022   BASOSABS 0.1 69/62/9528     Last metabolic panel Lab Results  Component Value Date   NA 132 (L) 05/27/2022   K 3.9 05/27/2022   CL 93 (L)  05/27/2022   CO2 23 05/27/2022   BUN 46 (H) 05/27/2022   CREATININE 6.36 (H) 05/27/2022   GLUCOSE 128 (H) 05/27/2022   GFRNONAA 6 (L) 05/27/2022   GFRAA 17 06/19/2020   CALCIUM 9.5 05/27/2022   PHOS 3.7 05/24/2022   PROT 7.7 05/22/2022   ALBUMIN 2.5 (L) 05/24/2022   LABGLOB 3.2 04/13/2022   AGRATIO 1.2 04/13/2022   BILITOT 1.1 05/22/2022   ALKPHOS 67 05/22/2022   AST 52 (H) 05/22/2022   ALT 61 (H) 05/22/2022   ANIONGAP 16 (H) 05/27/2022    GFR: Estimated Creatinine Clearance: 8.1 mL/min (A) (by C-G formula based on SCr of 6.36 mg/dL (H)).  Recent Results (from the past 240 hour(s))  Urine Culture     Status: None   Collection Time: 05/20/22  2:13 PM   Specimen: In/Out Cath Urine  Result Value Ref Range Status   Specimen Description IN/OUT CATH URINE  Final   Special Requests NONE  Final   Culture   Final    NO GROWTH Performed at [facility], [address], [place], [place] [address]    Report Status 05/21/2022 FINAL  Final  Culture, blood (Routine X 2) w Reflex to ID Panel     Status: None   Collection Time: 05/20/22  2:55 PM   Specimen: BLOOD  Result Value Ref Range Status   Specimen Description BLOOD BLOOD LEFT HAND  Final   Special Requests   Final    BOTTLES DRAWN AEROBIC AND ANAEROBIC Blood Culture adequate volume   Culture   Final    NO GROWTH 5 DAYS Performed at [facility], [place] [address] [place], [place] [address]    Report Status 05/25/2022 FINAL  Final  Culture, blood (Routine X 2) w Reflex to ID Panel     Status: None   Collection Time: 05/20/22  3:05 PM   Specimen: BLOOD  Result Value Ref Range Status   Specimen Description BLOOD BLOOD LEFT HAND  Final   Special Requests   Final    BOTTLES DRAWN AEROBIC AND ANAEROBIC Blood Culture adequate volume   Culture   Final    NO GROWTH 5 DAYS Performed at [facility], [place] [address], [place],  [address]    Report Status 05/25/2022 FINAL  Final      Radiology Studies: DG  CHEST PORT 1 VIEW  Result Date: 05/27/2022 CLINICAL DATA:  Chest pain EXAM: PORTABLE CHEST 1 VIEW COMPARISON:  Previous studies including the examination of 05/16/2022 FINDINGS: Transverse diameter of the heart is increased. There are no signs of pulmonary edema. There is interval decrease in pulmonary vascular congestion. There is no focal pulmonary consolidation. Small linear densities are seen in the lower lung fields. There is no pleural effusion or pneumothorax. Tip of dialysis catheter is seen in the region of right atrium. IMPRESSION: There are no signs of pulmonary edema or focal pulmonary consolidation. Small linear densities in the lower lung fields suggest minimal scarring or subsegmental atelectasis. Electronically Signed   By: Elmer Picker M.D.   On: 05/27/2022 14:57      LOS: 18 days    Cordelia Poche, MD [facility] 05/28/2022, 5:00 PM   If 7PM-7AM, please  contact night-coverage [email or web address]

## 2022-05-29 DIAGNOSIS — S88111A Complete traumatic amputation at level between knee and ankle, right lower leg, initial encounter: Secondary | ICD-10-CM | POA: Diagnosis not present

## 2022-05-29 DIAGNOSIS — I6381 Other cerebral infarction due to occlusion or stenosis of small artery: Secondary | ICD-10-CM | POA: Diagnosis not present

## 2022-05-29 DIAGNOSIS — I96 Gangrene, not elsewhere classified: Secondary | ICD-10-CM | POA: Diagnosis not present

## 2022-05-29 DIAGNOSIS — D72829 Elevated white blood cell count, unspecified: Secondary | ICD-10-CM | POA: Diagnosis not present

## 2022-05-29 LAB — CBC WITH DIFFERENTIAL/PLATELET
Abs Immature Granulocytes: 0 10*3/uL (ref 0.00–0.07)
Basophils Absolute: 0 10*3/uL (ref 0.0–0.1)
Basophils Relative: 0 %
Eosinophils Absolute: 0.5 10*3/uL (ref 0.0–0.5)
Eosinophils Relative: 2 %
HCT: 27.1 % — ABNORMAL LOW (ref 36.0–46.0)
Hemoglobin: 9.1 g/dL — ABNORMAL LOW (ref 12.0–15.0)
Lymphocytes Relative: 24 %
Lymphs Abs: 6.1 10*3/uL — ABNORMAL HIGH (ref 0.7–4.0)
MCH: 29.7 pg (ref 26.0–34.0)
MCHC: 33.6 g/dL (ref 30.0–36.0)
MCV: 88.6 fL (ref 80.0–100.0)
Monocytes Absolute: 2 10*3/uL — ABNORMAL HIGH (ref 0.1–1.0)
Monocytes Relative: 8 %
Neutro Abs: 16.8 10*3/uL — ABNORMAL HIGH (ref 1.7–7.7)
Neutrophils Relative %: 66 %
Platelets: 318 10*3/uL (ref 150–400)
RBC: 3.06 MIL/uL — ABNORMAL LOW (ref 3.87–5.11)
RDW: 23.1 % — ABNORMAL HIGH (ref 11.5–15.5)
WBC: 25.5 10*3/uL — ABNORMAL HIGH (ref 4.0–10.5)
nRBC: 2.8 % — ABNORMAL HIGH (ref 0.0–0.2)
nRBC: 5 /100 WBC — ABNORMAL HIGH

## 2022-05-29 LAB — PROCALCITONIN: Procalcitonin: 1.77 ng/mL

## 2022-05-29 LAB — GLUCOSE, CAPILLARY
Glucose-Capillary: 124 mg/dL — ABNORMAL HIGH (ref 70–99)
Glucose-Capillary: 215 mg/dL — ABNORMAL HIGH (ref 70–99)
Glucose-Capillary: 200 mg/dL — ABNORMAL HIGH (ref 70–99)
Glucose-Capillary: 158 mg/dL — ABNORMAL HIGH (ref 70–99)

## 2022-05-29 NOTE — Plan of Care (Signed)
  Problem: Health Behavior/Discharge Planning: Goal: Ability to identify and utilize available resources and services will improve Outcome: Progressing Goal: Ability to manage health-related needs will improve Outcome: Progressing   Problem: Metabolic: Goal: Ability to maintain appropriate glucose levels will improve Outcome: Progressing

## 2022-05-29 NOTE — Progress Notes (Addendum)
[facility] Progress Note   Subjective:  Seen in room, husband at bedside. Looks calm. No new complaints. No CP/dyspnea.  Objective Vitals:   05/28/22 2021 05/28/22 2022 05/29/22 0456 05/29/22 0732  BP:  (!) 126/57 (!) 110/50   Pulse: 68 67 65   Resp: '19 20 20   '$ Temp:  97.6 F (36.4 C) 98.3 F (36.8 C)   TempSrc:  Axillary Oral   SpO2: 100% 100% 99% 98%  Weight:      Height:       Physical Exam General: Chronically ill appearing woman, room air. Heart: RRR; no murmur Lungs: CTA anteriorly Abdomen: soft Extremities: No LLE edema; R stump with clean bandage. Dialysis Access: [name] in R chest  Additional Objective Labs: Basic Metabolic Panel: Recent Labs  Lab 05/23/22 0133 05/23/22 0711 05/24/22 0555 05/25/22 0909 05/27/22 0235  NA 128*   < > 132* 128* 132*  K 5.1   < > 3.6 3.9 3.9  CL 88*   < > 94* 90* 93*  CO2 22   < > '26 24 23  '$ GLUCOSE 551*   < > 194* 213* 128*  BUN 90*   < > 40* 66* 46*  CREATININE 9.65*   < > 5.08* 7.44* 6.36*  CALCIUM 9.9   < > 9.1 9.5 9.5  PHOS 5.4*  --  3.7  --   --    < > = values in this interval not displayed.   Liver Function Tests: Recent Labs  Lab 05/23/22 0133 05/24/22 0555  ALBUMIN 2.5* 2.5*   CBC: Recent Labs  Lab 05/23/22 0133 05/25/22 0909 05/27/22 0235 05/28/22 0130 05/29/22 0404  WBC 32.0* 24.1* 28.8* 35.1* 25.5*  NEUTROABS 26.3*  --   --   --  16.8*  HGB 8.7* 9.2* 9.5* 9.7* 9.1*  HCT 25.3* 26.9* 28.7* 29.1* 27.1*  MCV 84.6 85.7 88.9 89.8 88.6  PLT 521* 430* 360 325 318   Blood Culture    Component Value Date/Time   SDES BLOOD LEFT HAND 05/28/2022 1810   SDES BLOOD LEFT HAND 05/28/2022 1810   SPECREQUEST  05/28/2022 1810    BOTTLES DRAWN AEROBIC AND ANAEROBIC Blood Culture results may not be optimal due to an inadequate volume of blood received in culture bottles   SPECREQUEST  05/28/2022 1810    BOTTLES DRAWN AEROBIC AND ANAEROBIC Blood Culture adequate volume   CULT  05/28/2022 1810     NO GROWTH < 24 HOURS Performed at [facility], [place] [address] [place], [place] [address]    CULT GRAM [id number] XTGG 05/28/2022 1810   REPTSTATUS PENDING 05/28/2022 1810   REPTSTATUS PENDING 05/28/2022 1810   Studies/Results: DG CHEST PORT 1 VIEW  Result Date: 05/27/2022 CLINICAL DATA:  Chest pain EXAM: PORTABLE CHEST 1 VIEW COMPARISON:  Previous studies including the examination of 05/16/2022 FINDINGS: Transverse diameter of the heart is increased. There are no signs of pulmonary edema. There is interval decrease in pulmonary vascular congestion. There is no focal pulmonary consolidation. Small linear densities are seen in the lower lung fields. There is no pleural effusion or pneumothorax. Tip of dialysis catheter is seen in the region of right atrium. IMPRESSION: There are no signs of pulmonary edema or focal pulmonary consolidation. Small linear densities in the lower lung fields suggest minimal scarring or subsegmental atelectasis. Electronically Signed   By: Elmer Picker M.D.   On: 05/27/2022 14:57    Medications:  ceFEPime (MAXIPIME) IV 1 g (05/28/22 2245)  magnesium sulfate bolus IVPB     [START ON 05/30/2022] vancomycin      (feeding supplement) PROSource Plus  30 mL Oral BID BM   acetaminophen  1,000 mg Oral Q8H   vitamin C  1,000 mg Oral Daily   carvedilol  3.125 mg Oral BID WC   Chlorhexidine Gluconate Cloth  6 each Topical Q0600   clopidogrel  75 mg Oral Daily   darbepoetin (ARANESP) injection - DIALYSIS  200 mcg Intravenous Q Fri-HD   docusate sodium  100 mg Oral Daily   feeding supplement (NEPRO CARB STEADY)  237 mL Oral BID BM   heparin injection (subcutaneous)  5,000 Units Subcutaneous Q8H   hydrocortisone   Rectal BID   insulin aspart  0-9 Units Subcutaneous TID WC   insulin aspart  3 Units Subcutaneous TID WC   insulin glargine-yfgn  6 Units Subcutaneous BID   levETIRAcetam  500 mg Oral QHS   losartan  100 mg Oral Daily   methylphenidate  5 mg Oral BID  WC   mometasone-formoterol  2 puff Inhalation BID   nutrition supplement (JUVEN)  1 packet Oral BID BM   pantoprazole  40 mg Oral Daily   rosuvastatin  10 mg Oral Daily   sevelamer carbonate  0.8 g Oral TID WC   zinc sulfate  220 mg Oral Daily    Dialysis Orders: MWF - [facility] 4 hrs 180NRE 350/700 88.5 kg 2.0 K/2.0 Ca UFP 4 No heparin - Mircera 200 mcg  IV q 2wks - last 04/29/22 - Venofer '100mg'$  IV X 5 doses-new order, hasn't been given yet - Hectorol 40mg IV TIW   Assessment/Plan: 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 DSharol Given Previously off antibiotics, resumed empirically with rising WBC - better today (35 -> 25.5). Acute Metabolic Encephalopathy/Intermittent Confusion: Seroquel stopped, minimizing sedatives. CT head negative. Cefepime discontinued. Waxing and waning. Managed by primary. ESRD: Continue HD on usual MWF schedule - next HD 8/7. Anemia of ESRD: Hgb 9.1, continue weekly Aranesp (205m q Fri). Was on IV iron, stopped 8/5 b/c appears to have ongoing infection. Secondary hyperparathyroidism : COrrCa high, VDRA on hold. Continue Renvela as binder. HTN/volume: BP controlled, lowering volume as tolerated - only 1L UF with last HD d/t hypotension. Nutrition - Very low albumin, continue protein supplements.  T2DM: Per primary. Hx CVA: On dual ASA/plavix, and Keppra.    KaVeneta PentonPA-C 05/29/2022, 10:26 AM  [facility] Beach[facility]phrology attending: I have personally seen and examined the patient.  Chart reviewed.  I agree with above. Leukocytosis is improving.  Currently on dry dressing.  Plan for regular dialysis tomorrow, we will minimize UF goal.  Continue rest of the medication.  Awaiting safe discharge to SNF.  Discussed with her husband Dr. WhVenetia Maxon D.[facility].

## 2022-05-29 NOTE — Progress Notes (Signed)
PROGRESS NOTE    Carolyn Russo  QJJ:[id number] DOB: December 18, 1950 DOA: 05/10/2022 PCP: [name], MD   Brief Narrative: Carolyn Russo is a 71 y.o. female with a history of ESRD on HD, CVA with left hemiplegia, seizures, diabetes, hypertension, hyperlipidemia, chronic systolic heart failure, PAD, GI bleed. Patient presented secondary to worsening right foot infection, found to have gangrene. Empiric antibiotics initiated and patient underwent a right BKA.   Assessment and Plan:  Gangrene of right foot Empiric Vancomycin and Cefepime initiated on admission. Vascular and orthopedic surgery consulted. Patient underwent right BKA on 7/26 with subsequent discontinuation of antibiotics. PT/OT consulted with recommendations for SNF, however family is hoping for possible discharge to CIR. Patient with some pain of her leg but manageable. Wound vacuum removed on 8/6 for evaluation for infection.  PAD Contributed to above. -Continue Plavix  Acute metabolic encephalopathy Unsure of etiology. Workup without specific/reversible etiology identified. Possibly related to medication effect. Patient with slow but positive recovery of mental status function. Significantly improved for me today.  Leukocytosis Unknown etiology. Possibly related to steroid use. Peak WBC of 38,800. Trended down initially but now significantly elevated again. Procalcitonin is also elevated but in setting of ESRD on HD. Repeat blood cultures again obtained. Empiric Vancomycin and Cefepime started on 8/5. Leukocytosis significantly improved. -CBC daily -Follow-up blood cultures -Empiric Vancomycin and Cefepime  Chest pain Left sided, achy. Reproducible. Atypical. No associated dyspnea. EKG without acute ischemic changes. Chest x-ray without acute process. -Supportive care  History of CVA Resultant left hemiplegia. Stable.  ESRD on HD Nephrology consulted. Patient on MWF schedule for HD.  Chronic anemia Anemia of  chronic disease Stable.  Diabetes mellitus, type 2 Patient is on Lantus 12 units daily as an outpatient. Fasting blood sugar of 167 mg/dL this morning. -Continue Semglee 6 units BID; may need to increase dose -Continue Novolog 3 units TID with meals and SSI  Obesity Body mass index is 28.07 kg/m. Although BMI is less than 30 kg/m, this is secondary to   DVT prophylaxis: Heparin subq Code Status:   Code Status: Full Code Family Communication: Husband at bedside Disposition Plan: Discharge to CIR vs SNF; at this time discharge is leaning more towards SNF as patient has been unable to mobilize well enough with PT. Medically stable possible in 2-3 days pending infection workup.   Consultants:  Nephrology Orthopedic surgery Vascular surgery  Procedures:  Right BKA (7/26)  Antimicrobials: Vancomycin Cefepime    Subjective: Some pain of her right BKA stump but is manageable. No issues noted overnight.  Objective: BP (!) 110/50 (BP Location: Left Arm)   Pulse 65   Temp 98.3 F (36.8 C) (Oral)   Resp 20   Ht '5\' 5"'$  (1.651 m)   Wt 76.5 kg   SpO2 98%   BMI 28.07 kg/m   Examination:  General exam: Appears calm and comfortable Respiratory system: Clear to auscultation. Respiratory effort normal. Cardiovascular system: S1 & S2 heard, RRR. Gastrointestinal system: Abdomen is nondistended, soft and nontender. Normal bowel sounds heard. Central nervous system: Alert and oriented. Musculoskeletal: No edema. No calf tenderness. Right BKA Skin: No cyanosis. No rashes   Data Reviewed: I have personally reviewed following labs and imaging studies  CBC Lab Results  Component Value Date   WBC 25.5 (H) 05/29/2022   RBC 3.06 (L) 05/29/2022   HGB 9.1 (L) 05/29/2022   HCT 27.1 (L) 05/29/2022   MCV 88.6 05/29/2022   MCH 29.7 05/29/2022   PLT  318 05/29/2022   MCHC 33.6 05/29/2022   RDW 23.1 (H) 05/29/2022   LYMPHSABS 6.1 (H) 05/29/2022   MONOABS 2.0 (H) 05/29/2022   EOSABS  0.5 05/29/2022   BASOSABS 0.0 52/77/8242     Last metabolic panel Lab Results  Component Value Date   NA 132 (L) 05/27/2022   K 3.9 05/27/2022   CL 93 (L) 05/27/2022   CO2 23 05/27/2022   BUN 46 (H) 05/27/2022   CREATININE 6.36 (H) 05/27/2022   GLUCOSE 128 (H) 05/27/2022   GFRNONAA 6 (L) 05/27/2022   GFRAA 17 06/19/2020   CALCIUM 9.5 05/27/2022   PHOS 3.7 05/24/2022   PROT 7.7 05/22/2022   ALBUMIN 2.5 (L) 05/24/2022   LABGLOB 3.2 04/13/2022   AGRATIO 1.2 04/13/2022   BILITOT 1.1 05/22/2022   ALKPHOS 67 05/22/2022   AST 52 (H) 05/22/2022   ALT 61 (H) 05/22/2022   ANIONGAP 16 (H) 05/27/2022    GFR: Estimated Creatinine Clearance: 8.1 mL/min (A) (by C-G formula based on SCr of 6.36 mg/dL (H)).  Recent Results (from the past 240 hour(s))  Urine Culture     Status: None   Collection Time: 05/20/22  2:13 PM   Specimen: In/Out Cath Urine  Result Value Ref Range Status   Specimen Description IN/OUT CATH URINE  Final   Special Requests NONE  Final   Culture   Final    NO GROWTH Performed at [facility], [address] [place], [place] [address]    Report Status 05/21/2022 FINAL  Final  Culture, blood (Routine X 2) w Reflex to ID Panel     Status: None   Collection Time: 05/20/22  2:55 PM   Specimen: BLOOD  Result Value Ref Range Status   Specimen Description BLOOD BLOOD LEFT HAND  Final   Special Requests   Final    BOTTLES DRAWN AEROBIC AND ANAEROBIC Blood Culture adequate volume   Culture   Final    NO GROWTH 5 DAYS Performed at [facility], [place] [address], [place], [place] [address]    Report Status 05/25/2022 FINAL  Final  Culture, blood (Routine X 2) w Reflex to ID Panel     Status: None   Collection Time: 05/20/22  3:05 PM   Specimen: BLOOD  Result Value Ref Range Status   Specimen Description BLOOD BLOOD LEFT HAND  Final   Special Requests   Final    BOTTLES DRAWN AEROBIC AND ANAEROBIC Blood Culture adequate volume   Culture   Final    NO  GROWTH 5 DAYS Performed at [facility], [place] [address] [place], [place] [address]    Report Status 05/25/2022 FINAL  Final  Culture, blood (Routine X 2) w Reflex to ID Panel     Status: None (Preliminary result)   Collection Time: 05/28/22  6:10 PM   Specimen: BLOOD LEFT HAND  Result Value Ref Range Status   Specimen Description BLOOD LEFT HAND  Final   Special Requests   Final    BOTTLES DRAWN AEROBIC AND ANAEROBIC Blood Culture results may not be optimal due to an inadequate volume of blood received in culture bottles   Culture   Final    NO GROWTH < 24 HOURS Performed at [facility], [place] [address] [place], [place] [address]    Report Status PENDING  Incomplete  Culture, blood (Routine X 2) w Reflex to ID Panel     Status: None (Preliminary result)  Collection Time: 05/28/22  6:10 PM   Specimen: BLOOD LEFT HAND  Result Value Ref Range Status   Specimen Description BLOOD LEFT HAND  Final   Special Requests   Final    BOTTLES DRAWN AEROBIC AND ANAEROBIC Blood Culture adequate volume   Culture  Setup Time   Final    GRAM POSITIVE RODS AEROBIC BOTTLE ONLY CRITICAL RESULT CALLED TO, READ BACK BY AND VERIFIED WITH: PHARMD BEN.M AT 0919 ON 05/28/2022 BY T.SAAD. Performed at [facility], [place] [address], [place], [place] [address]    Culture GRAM POSITIVE RODS  Final   Report Status PENDING  Incomplete      Radiology Studies: DG CHEST PORT 1 VIEW  Result Date: 05/27/2022 CLINICAL DATA:  Chest pain EXAM: PORTABLE CHEST 1 VIEW COMPARISON:  Previous studies including the examination of 05/16/2022 FINDINGS: Transverse diameter of the heart is increased. There are no signs of pulmonary edema. There is interval decrease in pulmonary vascular congestion. There is no focal pulmonary consolidation. Small linear densities are seen in the lower lung fields. There is no pleural effusion or pneumothorax. Tip of dialysis catheter is seen in the region of right atrium.  IMPRESSION: There are no signs of pulmonary edema or focal pulmonary consolidation. Small linear densities in the lower lung fields suggest minimal scarring or subsegmental atelectasis. Electronically Signed   By: Elmer Picker M.D.   On: 05/27/2022 14:57      LOS: 19 days    Cordelia Poche, MD [facility] 05/29/2022, 1:23 PM   If 7PM-7AM, please contact night-coverage [email or web address]

## 2022-05-30 ENCOUNTER — Inpatient Hospital Stay (HOSPITAL_COMMUNITY): Payer: Medicare PPO

## 2022-05-30 DIAGNOSIS — I96 Gangrene, not elsewhere classified: Secondary | ICD-10-CM | POA: Diagnosis not present

## 2022-05-30 DIAGNOSIS — S88111A Complete traumatic amputation at level between knee and ankle, right lower leg, initial encounter: Secondary | ICD-10-CM | POA: Diagnosis not present

## 2022-05-30 DIAGNOSIS — I6381 Other cerebral infarction due to occlusion or stenosis of small artery: Secondary | ICD-10-CM | POA: Diagnosis not present

## 2022-05-30 DIAGNOSIS — D72829 Elevated white blood cell count, unspecified: Secondary | ICD-10-CM | POA: Diagnosis not present

## 2022-05-30 LAB — RENAL FUNCTION PANEL
Albumin: 2.5 g/dL — ABNORMAL LOW (ref 3.5–5.0)
Anion gap: 15 (ref 5–15)
BUN: 74 mg/dL — ABNORMAL HIGH (ref 8–23)
CO2: 23 mmol/L (ref 22–32)
Calcium: 9 mg/dL (ref 8.9–10.3)
Chloride: 89 mmol/L — ABNORMAL LOW (ref 98–111)
Creatinine, Ser: 8.67 mg/dL — ABNORMAL HIGH (ref 0.44–1.00)
GFR, Estimated: 4 mL/min — ABNORMAL LOW (ref >60)
Glucose, Bld: 151 mg/dL — ABNORMAL HIGH (ref 70–99)
Phosphorus: 5.3 mg/dL — ABNORMAL HIGH (ref 2.5–4.6)
Potassium: 3.8 mmol/L (ref 3.5–5.1)
Sodium: 127 mmol/L — ABNORMAL LOW (ref 135–145)

## 2022-05-30 LAB — CBC
HCT: 27.8 % — ABNORMAL LOW (ref 36.0–46.0)
Hemoglobin: 9.5 g/dL — ABNORMAL LOW (ref 12.0–15.0)
MCH: 30.4 pg (ref 26.0–34.0)
MCHC: 34.2 g/dL (ref 30.0–36.0)
MCV: 89.1 fL (ref 80.0–100.0)
Platelets: 356 10*3/uL (ref 150–400)
RBC: 3.12 MIL/uL — ABNORMAL LOW (ref 3.87–5.11)
RDW: 23.3 % — ABNORMAL HIGH (ref 11.5–15.5)
WBC: 19.1 10*3/uL — ABNORMAL HIGH (ref 4.0–10.5)
nRBC: 2.7 % — ABNORMAL HIGH (ref 0.0–0.2)

## 2022-05-30 LAB — CULTURE, BLOOD (ROUTINE X 2): Special Requests: ADEQUATE

## 2022-05-30 LAB — GLUCOSE, CAPILLARY
Glucose-Capillary: 89 mg/dL (ref 70–99)
Glucose-Capillary: 124 mg/dL — ABNORMAL HIGH (ref 70–99)
Glucose-Capillary: 118 mg/dL — ABNORMAL HIGH (ref 70–99)
Glucose-Capillary: 115 mg/dL — ABNORMAL HIGH (ref 70–99)

## 2022-05-30 MED ORDER — MELATONIN 3 MG PO TABS
3.0000 mg | ORAL_TABLET | Freq: Every evening | ORAL | Status: DC | PRN
  Administered 2022-06-01 – 2022-06-06 (×3): 3 mg via ORAL
  Filled 2022-05-30 (×3): qty 1

## 2022-05-30 MED ORDER — HEPARIN SODIUM (PORCINE) 1000 UNIT/ML IJ SOLN
INTRAMUSCULAR | Status: AC
  Filled 2022-05-30: qty 4

## 2022-05-30 NOTE — Progress Notes (Signed)
Physical Therapy Treatment Patient Details Name: Carolyn Russo MRN: [id number] DOB: 07-Jan-1951 Today's Date: 05/30/2022   History of Present Illness 71 y/o female presented to ED on 05/10/22 for R foot infection after 1st ray amputation x 1 month ago. S/p R BKA on 7/26. PMH: diabetes, hypertension, hyperlipidemia, ESRD on HD MWF, hx of CVA 03/2021    PT Comments    Continuing work on functional mobility and activity tolerance;  Session focused on upright sitting, balance, engaging in upright activity and interaction, as well as incentive spirometry and LE therex; Pt with reports of SOB, and was getting a CXR, so ended session back in bed; Sat EOB approx 20 minutes and performed alternating LAQs; Focused on incentive spirometry mostly due to her statements that she "need(s) air"; Lots of demo cues for use of incentive spirometry, and tactile cues for upper trunk extension and scap retraction with inspiration; Shallow breaths, thoguh, pulled up to 230 on spirometer; O2 sats 99-100% throughout session, HR in 60s; Able to take her meds in applesauce sitting as well;   Much more interactive today, answering the majority of questions related to how she feels; Reports her favorite flower is the orchid; Consistently requesting to have her back scratched;   Recommend pt start going to HD in the HD recliner.  Recommendations for follow up therapy are one component of a multi-disciplinary discharge planning process, led by the attending physician.  Recommendations may be updated based on patient status, additional functional criteria and insurance authorization.  Follow Up Recommendations  Skilled nursing-short term rehab (<3 hours/day) Can patient physically be transported by private vehicle: No   Assistance Recommended at Discharge Frequent or constant Supervision/Assistance  Patient can return home with the following Two people to help with walking and/or transfers;Two people to help with  bathing/dressing/bathroom;Assistance with feeding;Direct supervision/assist for medications management;Direct supervision/assist for financial management;Assist for transportation   [facility] bed (hoyer lift; medical Lucianne Lei transport to/from HD)    Recommendations for Other Services       Precautions / Restrictions Precautions Precautions: Fall Precaution Comments: limb protector Restrictions RLE Weight Bearing: Non weight bearing     Mobility  Bed Mobility Overal bed mobility: Needs Assistance Bed Mobility: Supine to Sit     Supine to sit: +2 for physical assistance, Max assist Sit to supine: +2 for physical assistance, Max assist   General bed mobility comments: Able to follow commands to move her LEs towards EOB in prep for getting up; Max assist with support in front of her and behind her; noting effort to turn and push up    Transfers Overall transfer level: Needs assistance Equipment used: 2 person hand held assist (and bed pad) Transfers: Bed to chair/wheelchair/BSC            Lateral/Scoot Transfers: Total assist, +2 physical assistance General transfer comment: Simulated lateral scoot with bed pad, scooting up to [facility]    Ambulation/Gait                   Stairs             Wheelchair Mobility    Modified Rankin (Stroke Patients Only)       Balance     Sitting balance-Leahy Scale: Poor (approaching Fair) Sitting balance - Comments: initially needing min/mod assist for upright sitting due to R lateral lean; progressed to minguard assist sitting EOB for LE therex and incentive spirometer use Postural control: Right lateral lean (th emildest this PT  has seen it this admission)                                  Cognition Arousal/Alertness: Awake/alert (eyes open entire session) Behavior During Therapy: Flat affect Overall Cognitive Status: Impaired/Different from baseline                          Following Commands: Follows one step commands inconsistently, Follows one step commands with increased time       General Comments: Answered simple questions with incr time; most awake and interactive this PT has seen this admission        Exercises Other Exercises Other Exercises: Seated alternating LAQs x 10 reps; able to fully extend R knee    General Comments General comments (skin integrity, edema, etc.): Sat EOB to take meds; Session conducted on room air and O2 sats remained 99-100%; HR in the 60s; BP sitting upright stable      Pertinent Vitals/Pain Pain Assessment Pain Assessment: Faces Faces Pain Scale: No hurt Pain Location: No grimace with moving RLE today; Back is itching Pain Intervention(s): Monitored during session    Home Living                          Prior Function            PT Goals (current goals can now be found in the care plan section) Acute Rehab PT Goals Patient Stated Goal: Throughout session indicated she wanted her back scratched PT Goal Formulation: Patient unable to participate in goal setting Time For Goal Achievement: 06/02/22 Potential to Achieve Goals: Fair Progress towards PT goals: Progressing toward goals (slowly)    Frequency    Min 2X/week      PT Plan Current plan remains appropriate    Co-evaluation              AM-PAC PT "6 Clicks" Mobility   Outcome Measure  Help needed turning from your back to your side while in a flat bed without using bedrails?: Total Help needed moving from lying on your back to sitting on the side of a flat bed without using bedrails?: Total Help needed moving to and from a bed to a chair (including a wheelchair)?: Total Help needed standing up from a chair using your arms (e.g., wheelchair or bedside chair)?: Total Help needed to walk in hospital room?: Total Help needed climbing 3-5 steps with a railing? : Total 6 Click Score: 6    End of Session Equipment  Utilized During Treatment: Other (comment) (bed pads; Maximove) Activity Tolerance: Patient tolerated treatment well Patient left: in bed;with call bell/phone within reach;with bed alarm set (bed in semi-chair position) Nurse Communication: Mobility status PT Visit Diagnosis: Muscle weakness (generalized) (M62.81);Other abnormalities of gait and mobility (R26.89)     Time: 9417-4081 PT Time Calculation (min) (ACUTE ONLY): 31 min  Charges:  $Therapeutic Activity: 23-37 mins                     Roney Marion, PT  Acute Rehabilitation Services Office [phone]    [name] 05/30/2022, 2:46 PM

## 2022-05-30 NOTE — Progress Notes (Signed)
[facility] Progress Note   Subjective:  Seen in room. C/o SOB this am. Appears comfortable on RA. CXR ordered. For dialysis today.   Objective Vitals:   05/29/22 1917 05/29/22 2138 05/30/22 0437 05/30/22 0801  BP:  139/72 137/62   Pulse: 68 63 66   Resp: 19 (!) 24    Temp:  97.7 F (36.5 C) 97.7 F (36.5 C)   TempSrc:   Oral   SpO2: 97% 99% 98% 97%  Weight:      Height:       Physical Exam General: Chronically ill appearing woman, room air. Heart: RRR; no murmur Lungs: Clear on left side, lying on right. Normal WOB.  Abdomen: soft Extremities: No LLE edema; R stump with clean bandage. Dialysis Access: [facility] in R chest  Additional Objective Labs: Basic Metabolic Panel: Recent Labs  Lab 05/24/22 0555 05/25/22 0909 05/27/22 0235  NA 132* 128* 132*  K 3.6 3.9 3.9  CL 94* 90* 93*  CO2 '26 24 23  '$ GLUCOSE 194* 213* 128*  BUN 40* 66* 46*  CREATININE 5.08* 7.44* 6.36*  CALCIUM 9.1 9.5 9.5  PHOS 3.7  --   --     Liver Function Tests: Recent Labs  Lab 05/24/22 0555  ALBUMIN 2.5*    CBC: Recent Labs  Lab 05/25/22 0909 05/27/22 0235 05/28/22 0130 05/29/22 0404 05/30/22 0737  WBC 24.1* 28.8* 35.1* 25.5* 19.1*  NEUTROABS  --   --   --  16.8*  --   HGB 9.2* 9.5* 9.7* 9.1* 9.5*  HCT 26.9* 28.7* 29.1* 27.1* 27.8*  MCV 85.7 88.9 89.8 88.6 89.1  PLT 430* 360 325 318 356    Blood Culture    Component Value Date/Time   SDES BLOOD LEFT HAND 05/28/2022 1810   SDES BLOOD LEFT HAND 05/28/2022 1810   SPECREQUEST  05/28/2022 1810    BOTTLES DRAWN AEROBIC AND ANAEROBIC Blood Culture results may not be optimal due to an inadequate volume of blood received in culture bottles   SPECREQUEST  05/28/2022 1810    BOTTLES DRAWN AEROBIC AND ANAEROBIC Blood Culture adequate volume   CULT  05/28/2022 1810    NO GROWTH 2 DAYS Performed at [facility], [address], [place], [place] [address]    CULT GRAM [id number] NATF 05/28/2022 1810   REPTSTATUS  PENDING 05/28/2022 1810   REPTSTATUS PENDING 05/28/2022 1810   Studies/Results: No results found.  Medications:  ceFEPime (MAXIPIME) IV 1 g (05/29/22 1659)   magnesium sulfate bolus IVPB     vancomycin      (feeding supplement) PROSource Plus  30 mL Oral BID BM   acetaminophen  1,000 mg Oral Q8H   vitamin C  1,000 mg Oral Daily   carvedilol  3.125 mg Oral BID WC   Chlorhexidine Gluconate Cloth  6 each Topical Q0600   clopidogrel  75 mg Oral Daily   darbepoetin (ARANESP) injection - DIALYSIS  200 mcg Intravenous Q Fri-HD   docusate sodium  100 mg Oral Daily   feeding supplement (NEPRO CARB STEADY)  237 mL Oral BID BM   heparin injection (subcutaneous)  5,000 Units Subcutaneous Q8H   hydrocortisone   Rectal BID   insulin aspart  0-9 Units Subcutaneous TID WC   insulin aspart  3 Units Subcutaneous TID WC   insulin glargine-yfgn  6 Units Subcutaneous BID   levETIRAcetam  500 mg Oral QHS   losartan  100 mg Oral Daily   methylphenidate  5 mg  Oral BID WC   mometasone-formoterol  2 puff Inhalation BID   nutrition supplement (JUVEN)  1 packet Oral BID BM   pantoprazole  40 mg Oral Daily   rosuvastatin  10 mg Oral Daily   sevelamer carbonate  0.8 g Oral TID WC   zinc sulfate  220 mg Oral Daily    Dialysis Orders: MWF - [name] 4 hrs 180NRE 350/700 88.5 kg 2.0 K/2.0 Ca UFP 4 No heparin - Mircera 200 mcg  IV q 2wks - last 04/29/22 - Venofer '100mg'$  IV X 5 doses-new order, hasn't been given yet - Hectorol 70mg IV TIW   Assessment/Plan: 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 DSharol Given Previously off antibiotics, resumed empirically with rising WBC. WBC now trending down.  Acute Metabolic Encephalopathy/Intermittent Confusion: Seroquel stopped, minimizing sedatives. CT head negative. Cefepime discontinued. Waxing and waning. Managed by primary. ESRD: Continue HD on usual MWF schedule - next HD 8/7. Anemia of ESRD: Hgb 9.5, continue weekly Aranesp (2077m q Fri). Was on IV  iron, stopped 8/5 b/c appears to have ongoing infection. Secondary hyperparathyroidism : COrrCa high, VDRA on hold. Continue Renvela as binder. HTN/volume: BP controlled, lowering volume as tolerated - only 1L UF with last HD d/t hypotension. Nutrition - Very low albumin, continue protein supplements.  T2DM: Per primary. Hx CVA: On dual ASA/plavix, and Keppra.   OgLynnda ChildA-C [facility] 05/30/2022,9:39 AM

## 2022-05-30 NOTE — Progress Notes (Signed)
Pt only got an hour of HD due to confusion per HD Rn. Based on the load by weight, she should still have around a level of 16 in the system. We will hold post HD vanc tonight.   Onnie Boer, PharmD, BCIDP, AAHIVP, CPP Infectious Disease Pharmacist 05/30/2022 8:16 PM

## 2022-05-30 NOTE — Progress Notes (Signed)
PROGRESS NOTE    Carolyn Russo  WVP:[id number] DOB: 14-May-1951 DOA: 05/10/2022 PCP: [name], MD   Brief Narrative: Carolyn Russo is a 71 y.o. female with a history of ESRD on HD, CVA with left hemiplegia, seizures, diabetes, hypertension, hyperlipidemia, chronic systolic heart failure, PAD, GI bleed. Patient presented secondary to worsening right foot infection, found to have gangrene. Empiric antibiotics initiated and patient underwent a right BKA.   Assessment and Plan:  Gangrene of right foot Empiric Vancomycin and Cefepime initiated on admission. Vascular and orthopedic surgery consulted. Patient underwent right BKA on 7/26 with subsequent discontinuation of antibiotics. PT/OT consulted with recommendations for SNF, however family is hoping for possible discharge to CIR. Patient with some pain of her leg but manageable. Wound vacuum removed on 8/6 for evaluation for infection.  PAD Contributed to above. -Continue Plavix  Acute metabolic encephalopathy Unsure of etiology. Workup without specific/reversible etiology identified. Possibly related to medication effect. Patient with slow but positive recovery of mental status function. Significantly improved for me today.  Shortness of breath Uncertain etiology. No objective signs or symptoms identified. Patient with no hypoxia and unchanged chest x-ray. Vitals stable.  Leukocytosis Unknown etiology. Possibly related to steroid use. Peak WBC of 38,800. Trended down initially but now significantly elevated again. Procalcitonin is also elevated but in setting of ESRD on HD. Repeat blood cultures again obtained. Empiric Vancomycin and Cefepime started on 8/5. Leukocytosis continues to improved. Bacillus species seen in 1/4 blood samples consistent with likely contaminant. -CBC daily -Empiric Vancomycin and Cefepime -Will discuss with ID for recommendations on antibiotic duration  Chest pain Left sided, achy. Reproducible.  Atypical. No associated dyspnea. EKG without acute ischemic changes. Chest x-ray without acute process. -Supportive care  History of CVA Resultant left hemiplegia. Stable.  ESRD on HD Nephrology consulted. Patient on MWF schedule for HD.  Chronic anemia Anemia of chronic disease Stable.  Diabetes mellitus, type 2 Patient is on Lantus 12 units daily as an outpatient. Fasting blood sugar of 89 mg/dL this morning. -Continue Semglee 6 units BID -Continue Novolog 3 units TID with meals and SSI  Obesity Body mass index is 28.07 kg/m. Although BMI is less than 30 kg/m, this is secondary to   DVT prophylaxis: Heparin subq Code Status:   Code Status: Full Code Family Communication: Husband on telephone Disposition Plan: Discharge to CIR vs SNF; at this time discharge is leaning more towards SNF as patient has been unable to mobilize well enough with PT. Medically stable possible in 2-3 days pending infection workup.   Consultants:  Nephrology Orthopedic surgery Vascular surgery  Procedures:  Right BKA (7/26)  Antimicrobials: Vancomycin Cefepime    Subjective: Patient reports having shortness of breath issues overnight. Initially stated she was not short of breath this morning, but then stated she is short of breath.  Objective: BP 137/62 (BP Location: Left Arm)   Pulse 66   Temp 97.7 F (36.5 C) (Oral)   Resp (!) 24   Ht '5\' 5"'$  (1.651 m)   Wt 76.5 kg   SpO2 97%   BMI 28.07 kg/m   Examination:  General exam: Appears calm and comfortable Respiratory system: Clear to auscultation. Respiratory effort normal. Cardiovascular system: S1 & S2 heard, RRR.  Gastrointestinal system: Abdomen is nondistended, soft and nontender. Normal bowel sounds heard. Central nervous system: Alert and oriented to person and place. Slow to respond to questions. Musculoskeletal: No calf tenderness Skin: No cyanosis. No rashes   Data Reviewed: I  have personally reviewed following labs  and imaging studies  CBC Lab Results  Component Value Date   WBC 19.1 (H) 05/30/2022   RBC 3.12 (L) 05/30/2022   HGB 9.5 (L) 05/30/2022   HCT 27.8 (L) 05/30/2022   MCV 89.1 05/30/2022   MCH 30.4 05/30/2022   PLT 356 05/30/2022   MCHC 34.2 05/30/2022   RDW 23.3 (H) 05/30/2022   LYMPHSABS 6.1 (H) 05/29/2022   MONOABS 2.0 (H) 05/29/2022   EOSABS 0.5 05/29/2022   BASOSABS 0.0 16/07/9603     Last metabolic panel Lab Results  Component Value Date   NA 132 (L) 05/27/2022   K 3.9 05/27/2022   CL 93 (L) 05/27/2022   CO2 23 05/27/2022   BUN 46 (H) 05/27/2022   CREATININE 6.36 (H) 05/27/2022   GLUCOSE 128 (H) 05/27/2022   GFRNONAA 6 (L) 05/27/2022   GFRAA 17 06/19/2020   CALCIUM 9.5 05/27/2022   PHOS 3.7 05/24/2022   PROT 7.7 05/22/2022   ALBUMIN 2.5 (L) 05/24/2022   LABGLOB 3.2 04/13/2022   AGRATIO 1.2 04/13/2022   BILITOT 1.1 05/22/2022   ALKPHOS 67 05/22/2022   AST 52 (H) 05/22/2022   ALT 61 (H) 05/22/2022   ANIONGAP 16 (H) 05/27/2022    GFR: Estimated Creatinine Clearance: 8.1 mL/min (A) (by C-G formula based on SCr of 6.36 mg/dL (H)).  Recent Results (from the past 240 hour(s))  Culture, blood (Routine X 2) w Reflex to ID Panel     Status: None (Preliminary result)   Collection Time: 05/28/22  6:10 PM   Specimen: BLOOD LEFT HAND  Result Value Ref Range Status   Specimen Description BLOOD LEFT HAND  Final   Special Requests   Final    BOTTLES DRAWN AEROBIC AND ANAEROBIC Blood Culture results may not be optimal due to an inadequate volume of blood received in culture bottles   Culture   Final    NO GROWTH 2 DAYS Performed at [facility], [place] [address] [place], [place] [address]    Report Status PENDING  Incomplete  Culture, blood (Routine X 2) w Reflex to ID Panel     Status: Abnormal   Collection Time: 05/28/22  6:10 PM   Specimen: BLOOD LEFT HAND  Result Value Ref Range Status   Specimen Description BLOOD LEFT HAND  Final   Special Requests    Final    BOTTLES DRAWN AEROBIC AND ANAEROBIC Blood Culture adequate volume   Culture  Setup Time   Final    GRAM POSITIVE RODS AEROBIC BOTTLE ONLY CRITICAL RESULT CALLED TO, READ BACK BY AND VERIFIED WITH: PHARMD BEN.M AT 0919 ON 05/28/2022 BY T.SAAD.    Culture (A)  Final    BACILLUS SPECIES Standardized susceptibility testing for this organism is not available. Performed at [facility], [place] [address] [place], [place] [address]    Report Status 05/30/2022 FINAL  Final      Radiology Studies: DG CHEST PORT 1 VIEW  Result Date: 05/30/2022 CLINICAL DATA:  Shortness of breath, history of leukocytosis and cellulitis EXAM: PORTABLE CHEST 1 VIEW COMPARISON:  May 27, 2022 FINDINGS: Unchanged positioning of right central venous catheter. Unchanged cardiomegaly. The mediastinal contours are within normal limits. Low lung volumes with bronchovascular crowding. No large pleural effusion or pneumothorax. The visualized skeletal structures are unremarkable. IMPRESSION: No acute cardiopulmonary abnormality. Electronically Signed   By: Beryle Flock M.D.   On: 05/30/2022 09:36      LOS: 20 days  Cordelia Poche, MD [facility] 05/30/2022, 3:28 PM   If 7PM-7AM, please contact night-coverage [email or web address]

## 2022-05-30 NOTE — Progress Notes (Signed)
Patient ID: Carolyn Russo, female   DOB: 1951-08-09, 72 y.o.   MRN: [id number] Examination of the right transtibial amputation shows good healing with no ischemic changes, no cellulitis, no signs of infection.

## 2022-05-30 NOTE — Progress Notes (Signed)
Patient is very confused,not following order HD catheter and tube to make kinks most the time resulting toicreased venous return pressure.Renal on call made aware.Stopped treatment due to patient not following instructions.

## 2022-05-31 ENCOUNTER — Inpatient Hospital Stay (HOSPITAL_COMMUNITY): Payer: Medicare PPO

## 2022-05-31 DIAGNOSIS — R569 Unspecified convulsions: Secondary | ICD-10-CM | POA: Diagnosis not present

## 2022-05-31 DIAGNOSIS — I6381 Other cerebral infarction due to occlusion or stenosis of small artery: Secondary | ICD-10-CM | POA: Diagnosis not present

## 2022-05-31 DIAGNOSIS — G9341 Metabolic encephalopathy: Secondary | ICD-10-CM

## 2022-05-31 DIAGNOSIS — R4182 Altered mental status, unspecified: Secondary | ICD-10-CM | POA: Diagnosis not present

## 2022-05-31 DIAGNOSIS — D72829 Elevated white blood cell count, unspecified: Secondary | ICD-10-CM | POA: Diagnosis not present

## 2022-05-31 DIAGNOSIS — I96 Gangrene, not elsewhere classified: Secondary | ICD-10-CM | POA: Diagnosis not present

## 2022-05-31 LAB — POCT I-STAT 7, (LYTES, BLD GAS, ICA,H+H)
Acid-base deficit: 2 mmol/L (ref 0.0–2.0)
Bicarbonate: 22.9 mmol/L (ref 20.0–28.0)
Calcium, Ion: 1.22 mmol/L (ref 1.15–1.40)
HCT: 33 % — ABNORMAL LOW (ref 36.0–46.0)
Hemoglobin: 11.2 g/dL — ABNORMAL LOW (ref 12.0–15.0)
O2 Saturation: 99 %
Potassium: 3.7 mmol/L (ref 3.5–5.1)
Sodium: 128 mmol/L — ABNORMAL LOW (ref 135–145)
TCO2: 24 mmol/L (ref 22–32)
pCO2 arterial: 38.6 mmHg (ref 32–48)
pH, Arterial: 7.382 (ref 7.35–7.45)
pO2, Arterial: 127 mmHg — ABNORMAL HIGH (ref 83–108)

## 2022-05-31 LAB — GLUCOSE, CAPILLARY
Glucose-Capillary: 101 mg/dL — ABNORMAL HIGH (ref 70–99)
Glucose-Capillary: 110 mg/dL — ABNORMAL HIGH (ref 70–99)
Glucose-Capillary: 117 mg/dL — ABNORMAL HIGH (ref 70–99)
Glucose-Capillary: 161 mg/dL — ABNORMAL HIGH (ref 70–99)
Glucose-Capillary: 165 mg/dL — ABNORMAL HIGH (ref 70–99)
Glucose-Capillary: 46 mg/dL — ABNORMAL LOW (ref 70–99)
Glucose-Capillary: 94 mg/dL (ref 70–99)

## 2022-05-31 LAB — CBC
HCT: 28.2 % — ABNORMAL LOW (ref 36.0–46.0)
Hemoglobin: 9.7 g/dL — ABNORMAL LOW (ref 12.0–15.0)
MCH: 30.6 pg (ref 26.0–34.0)
MCHC: 34.4 g/dL (ref 30.0–36.0)
MCV: 89 fL (ref 80.0–100.0)
Platelets: 339 10*3/uL (ref 150–400)
RBC: 3.17 MIL/uL — ABNORMAL LOW (ref 3.87–5.11)
RDW: 23.7 % — ABNORMAL HIGH (ref 11.5–15.5)
WBC: 22.2 10*3/uL — ABNORMAL HIGH (ref 4.0–10.5)
nRBC: 1.2 % — ABNORMAL HIGH (ref 0.0–0.2)

## 2022-05-31 MED ORDER — INSULIN GLARGINE-YFGN 100 UNIT/ML ~~LOC~~ SOLN
6.0000 [IU] | Freq: Every day | SUBCUTANEOUS | Status: DC
  Filled 2022-05-31: qty 0.06

## 2022-05-31 MED ORDER — DEXTROSE 50 % IV SOLN
INTRAVENOUS | Status: AC
  Filled 2022-05-31: qty 50

## 2022-05-31 MED ORDER — INSULIN GLARGINE-YFGN 100 UNIT/ML ~~LOC~~ SOLN: 6.0000 [IU] | Freq: Every day | SUBCUTANEOUS | Status: DC

## 2022-05-31 MED ORDER — INSULIN GLARGINE-YFGN 100 UNIT/ML ~~LOC~~ SOLN
6.0000 [IU] | Freq: Every day | SUBCUTANEOUS | Status: DC
  Administered 2022-06-01: 6 [IU] via SUBCUTANEOUS
  Filled 2022-05-31: qty 0.06

## 2022-05-31 MED ORDER — DEXTROSE 50 % IV SOLN
25.0000 g | INTRAVENOUS | Status: AC
  Administered 2022-05-31: 25 g via INTRAVENOUS

## 2022-05-31 MED ORDER — PIPERACILLIN-TAZOBACTAM IN DEX 2-0.25 GM/50ML IV SOLN
2.2500 g | Freq: Three times a day (TID) | INTRAVENOUS | Status: AC
Start: 2022-05-31 — End: 2022-06-05
  Administered 2022-05-31 – 2022-06-04 (×14): 2.25 g via INTRAVENOUS
  Filled 2022-05-31 (×15): qty 50

## 2022-05-31 NOTE — Progress Notes (Addendum)
PROGRESS NOTE    ZAREA DIESING  FBP:[id number] DOB: 10/24/1951 DOA: 05/10/2022 PCP: [name], MD   Brief Narrative: Carolyn Russo is a 71 y.o. female with a history of ESRD on HD, CVA with left hemiplegia, seizures, diabetes, hypertension, hyperlipidemia, chronic systolic heart failure, PAD, GI bleed. Patient presented secondary to worsening right foot infection, found to have gangrene. Empiric antibiotics initiated and patient underwent a right BKA. During hospitalization, course has been complicated by altered mental status, in addition to presumed infection of unknown source. Currently resumed on antibiotics.   Assessment and Plan:  Gangrene of right foot Empiric Vancomycin and Cefepime initiated on admission. Vascular and orthopedic surgery consulted. Patient underwent right BKA on 7/26 with subsequent discontinuation of antibiotics. PT/OT consulted with recommendations for SNF, however family is hoping for possible discharge to CIR. Patient with some pain of her leg but manageable. Wound vacuum removed on 8/6 for evaluation for infection.  PAD Contributed to above. -Continue Plavix  Acute metabolic encephalopathy Initial workup without specific/reversible etiology identified. Possibly related to medication effect. Patient with slow but positive recovery of mental status function. On 8/8, patient found to be lethargic with a blood sugar of 46. D50 given. Persistent stupor. Possible she could have had a seizure from hypoglycemia with resultant post-ictal phase. Cefepime recently started which could also have contributed, but episode seems to have coincided with hypoglycemic episode. -ABG -EEG to rule out possible seizure -CT head -May need to switch from Cefepime to Zosyn if no improvement  Shortness of breath Uncertain etiology. No objective signs or symptoms identified. Patient with no hypoxia and unchanged chest x-ray. Vitals stable.   Leukocytosis Unknown etiology.  Possibly related to steroid use. Peak WBC of 38,800. Trended down initially but now significantly elevated again. Procalcitonin is also elevated but in setting of ESRD on HD. Repeat blood cultures again obtained. Empiric Vancomycin and Cefepime started on 8/5. Leukocytosis continues to improved. Bacillus species seen in 1/4 blood samples consistent with likely contaminant. -CBC daily -Empiric Vancomycin and Cefepime  Chest pain Left sided, achy. Reproducible. Atypical. No associated dyspnea. EKG without acute ischemic changes. Chest x-ray without acute process. -Supportive care  History of CVA Resultant left hemiplegia. Stable.  ESRD on HD Nephrology consulted. Patient on MWF schedule for HD.  Chronic anemia Anemia of chronic disease Stable.  Diabetes mellitus, type 2 Patient is on Lantus 12 units daily as an outpatient. Fasting blood sugar of 89 mg/dL this morning. -Continue Semglee 6 units BID -Continue Novolog 3 units TID with meals and SSI  Obesity Body mass index is 28.07 kg/m. Although BMI is less than 30 kg/m, this is secondary to   DVT prophylaxis: Heparin subq Code Status:   Code Status: Full Code Family Communication: Husband at bedside Disposition Plan: Discharge to CIR vs SNF; at this time discharge is leaning more towards SNF as patient has been unable to mobilize well enough with PT. Medically stable possible in 2-3 days pending infection workup and improvement of mental status   Consultants:  Nephrology Orthopedic surgery Vascular surgery  Procedures:  Right BKA (7/26)  Antimicrobials: Vancomycin Cefepime    Subjective: Hypoglycemic episode this morning with associated stupor. Patient given D50 with improvement of blood sugar but continues to be difficult to arouse. No other events noted. No seizure activity noted.  Objective: BP (!) 127/48 (BP Location: Left Arm)   Pulse 66   Temp (!) 97.4 F (36.3 C) (Oral)   Resp 17   Ht 5'  5" (1.651 m)   Wt  76.5 kg   SpO2 96%   BMI 28.07 kg/m   Examination:  General exam: Appears calm and comfortable Respiratory system: Clear to auscultation. Respiratory effort normal. Cardiovascular system: S1 & S2 heard, RRR. Gastrointestinal system: Abdomen is nondistended, soft and nontender. Normal bowel sounds heard. Central nervous system: Stupor. Responds to noxious stimuli. Musculoskeletal: No edema. No calf tenderness Skin: No cyanosis. No rashes   Data Reviewed: I have personally reviewed following labs and imaging studies  CBC Lab Results  Component Value Date   WBC 22.2 (H) 05/31/2022   RBC 3.17 (L) 05/31/2022   HGB 11.2 (L) 05/31/2022   HCT 33.0 (L) 05/31/2022   MCV 89.0 05/31/2022   MCH 30.6 05/31/2022   PLT 339 05/31/2022   MCHC 34.4 05/31/2022   RDW 23.7 (H) 05/31/2022   LYMPHSABS 6.1 (H) 05/29/2022   MONOABS 2.0 (H) 05/29/2022   EOSABS 0.5 05/29/2022   BASOSABS 0.0 93/57/0177     Last metabolic panel Lab Results  Component Value Date   NA 128 (L) 05/31/2022   K 3.7 05/31/2022   CL 89 (L) 05/30/2022   CO2 23 05/30/2022   BUN 74 (H) 05/30/2022   CREATININE 8.67 (H) 05/30/2022   GLUCOSE 151 (H) 05/30/2022   GFRNONAA 4 (L) 05/30/2022   GFRAA 17 06/19/2020   CALCIUM 9.0 05/30/2022   PHOS 5.3 (H) 05/30/2022   PROT 7.7 05/22/2022   ALBUMIN 2.5 (L) 05/30/2022   LABGLOB 3.2 04/13/2022   AGRATIO 1.2 04/13/2022   BILITOT 1.1 05/22/2022   ALKPHOS 67 05/22/2022   AST 52 (H) 05/22/2022   ALT 61 (H) 05/22/2022   ANIONGAP 15 05/30/2022    GFR: Estimated Creatinine Clearance: 5.9 mL/min (A) (by C-G formula based on SCr of 8.67 mg/dL (H)).  Recent Results (from the past 240 hour(s))  Culture, blood (Routine X 2) w Reflex to ID Panel     Status: None (Preliminary result)   Collection Time: 05/28/22  6:10 PM   Specimen: BLOOD LEFT HAND  Result Value Ref Range Status   Specimen Description BLOOD LEFT HAND  Final   Special Requests   Final    BOTTLES DRAWN AEROBIC AND  ANAEROBIC Blood Culture results may not be optimal due to an inadequate volume of blood received in culture bottles   Culture   Final    NO GROWTH 3 DAYS Performed at [facility], [place] [address] [place], [place] [address]    Report Status PENDING  Incomplete  Culture, blood (Routine X 2) w Reflex to ID Panel     Status: Abnormal   Collection Time: 05/28/22  6:10 PM   Specimen: BLOOD LEFT HAND  Result Value Ref Range Status   Specimen Description BLOOD LEFT HAND  Final   Special Requests   Final    BOTTLES DRAWN AEROBIC AND ANAEROBIC Blood Culture adequate volume   Culture  Setup Time   Final    GRAM POSITIVE RODS AEROBIC BOTTLE ONLY CRITICAL RESULT CALLED TO, READ BACK BY AND VERIFIED WITH: PHARMD BEN.M AT 0919 ON 05/28/2022 BY T.SAAD.    Culture (A)  Final    BACILLUS SPECIES Standardized susceptibility testing for this organism is not available. Performed at [facility], [place] [address], [place], [place] [address]    Report Status 05/30/2022 FINAL  Final      Radiology Studies: DG CHEST PORT 1 VIEW  Result Date: 05/30/2022 CLINICAL DATA:  Shortness of breath,  history of leukocytosis and cellulitis EXAM: PORTABLE CHEST 1 VIEW COMPARISON:  May 27, 2022 FINDINGS: Unchanged positioning of right central venous catheter. Unchanged cardiomegaly. The mediastinal contours are within normal limits. Low lung volumes with bronchovascular crowding. No large pleural effusion or pneumothorax. The visualized skeletal structures are unremarkable. IMPRESSION: No acute cardiopulmonary abnormality. Electronically Signed   By: Beryle Flock M.D.   On: 05/30/2022 09:36      LOS: 21 days    Cordelia Poche, MD [facility] 05/31/2022, 11:27 AM   If 7PM-7AM, please contact night-coverage [email or web address]

## 2022-05-31 NOTE — Progress Notes (Addendum)
OT Cancellation Note  Patient Details Name: Carolyn Russo MRN: [id number] DOB: 1951-03-03   Cancelled Treatment:    Reason Eval/Treat Not Completed: Patient at procedure or test/ unavailable (EKG) addendum to add second check stat CT of head ordered  Jeri Modena 05/31/2022, 11:25 AM

## 2022-05-31 NOTE — Progress Notes (Signed)
Notified pt BG to be 47. Pt awake but not able to tolerate PO. Provider notified. 1amp D50 pulled from pyxis and given. Pt BG on recheck noted to be 165. Pt sleepy but arousable to sternal rub. VSS. Will continue to monitor.

## 2022-05-31 NOTE — Progress Notes (Signed)
PT Cancellation Note  Patient Details Name: Carolyn Russo MRN: [id number] DOB: 30-Jul-1951   Cancelled Treatment:    Reason Eval/Treat Not Completed: Medical issues which prohibited therapy  Returned post EEG, and discussed pt status with RN, LPN;  Hypoglycemic event earlier today, and continued somnolence at this time;   Stat Head CT ordered;   Will hold PT today, and plan to check back tomorrow (HD day) or Thursday;   Roney Marion, [place] [facility] [name] 05/31/2022, 12:20 PM

## 2022-05-31 NOTE — TOC Progression Note (Addendum)
Transition of Care [facility]) - Progression Note    Patient Details  Name: Carolyn Russo MRN: [id number] Date of Birth: 09/06/1951  [facility] of [facility]) CM/SW Contact  Joanne Chars, LCSW Phone Number: 05/31/2022, 3:28 PM  Clinical Narrative:   CSW spoke with pt husband, Dr [name].  He has reviewed ratings for [facility] and would like to pursue SNF option that has higher ratings.  CSW will reach out to additional facilities.    Expected Discharge Plan: [name] (vs CIR vs SNF) Barriers to Discharge: Continued Medical Work up  Expected Discharge Plan and Services Expected Discharge Plan: [place] (vs CIR vs SNF)   Discharge Planning Services: CM Consult                               HH Arranged: RN, PT, Speech Therapy (PTA active with [place]) [place]: [place] Determinants of Health (SDOH) Interventions    Readmission Risk Interventions     No data to display

## 2022-05-31 NOTE — Progress Notes (Signed)
[facility] Progress Note   Subjective:   HD ended early yesterday d/t AMS, confusion. Seen in room, somnolent, difficult to rouse from sleeping. EEG ordered today.   Objective Vitals:   05/30/22 1700 05/30/22 1946 05/31/22 0325 05/31/22 0757  BP: (!) 139/47  (!) 143/72 (!) 127/48  Pulse: 72 70 71 66  Resp: '17 18 20 17  '$ Temp: 97.6 F (36.4 C)  97.9 F (36.6 C) (!) 97.4 F (36.3 C)  TempSrc: Oral  Oral Oral  SpO2: 100% 100% 97% 96%  Weight:      Height:       Physical Exam General: Chronically ill appearing woman, room air. Heart: RRR; no murmur Lungs: Clear on left side, lying on right. Normal WOB.  Abdomen: soft Extremities: No LLE edema; R stump with clean bandage. Dialysis Access: [facility] in R chest  Additional Objective Labs: Basic Metabolic Panel: Recent Labs  Lab 05/25/22 0909 05/27/22 0235 05/30/22 1510 05/31/22 1049  NA 128* 132* 127* 128*  K 3.9 3.9 3.8 3.7  CL 90* 93* 89*  --   CO2 '24 23 23  '$ --   GLUCOSE 213* 128* 151*  --   BUN 66* 46* 74*  --   CREATININE 7.44* 6.36* 8.67*  --   CALCIUM 9.5 9.5 9.0  --   PHOS  --   --  5.3*  --     Liver Function Tests: Recent Labs  Lab 05/30/22 1510  ALBUMIN 2.5*    CBC: Recent Labs  Lab 05/27/22 0235 05/28/22 0130 05/29/22 0404 05/30/22 0737 05/31/22 0820 05/31/22 1049  WBC 28.8* 35.1* 25.5* 19.1* 22.2*  --   NEUTROABS  --   --  16.8*  --   --   --   HGB 9.5* 9.7* 9.1* 9.5* 9.7* 11.2*  HCT 28.7* 29.1* 27.1* 27.8* 28.2* 33.0*  MCV 88.9 89.8 88.6 89.1 89.0  --   PLT 360 325 318 356 339  --     Blood Culture    Component Value Date/Time   SDES BLOOD LEFT HAND 05/28/2022 1810   SDES BLOOD LEFT HAND 05/28/2022 1810   SPECREQUEST  05/28/2022 1810    BOTTLES DRAWN AEROBIC AND ANAEROBIC Blood Culture results may not be optimal due to an inadequate volume of blood received in culture bottles   SPECREQUEST  05/28/2022 1810    BOTTLES DRAWN AEROBIC AND ANAEROBIC Blood Culture adequate  volume   CULT  05/28/2022 1810    NO GROWTH 3 DAYS Performed at [facility], [place] [address], [place], [place] [address]    CULT (A) 05/28/2022 1810    BACILLUS SPECIES Standardized susceptibility testing for this organism is not available. Performed at [facility], [place] [address], [place], [place] [address]    REPTSTATUS PENDING 05/28/2022 1810   REPTSTATUS 05/30/2022 FINAL 05/28/2022 1810   Studies/Results: DG CHEST PORT 1 VIEW  Result Date: 05/30/2022 CLINICAL DATA:  Shortness of breath, history of leukocytosis and cellulitis EXAM: PORTABLE CHEST 1 VIEW COMPARISON:  May 27, 2022 FINDINGS: Unchanged positioning of right central venous catheter. Unchanged cardiomegaly. The mediastinal contours are within normal limits. Low lung volumes with bronchovascular crowding. No large pleural effusion or pneumothorax. The visualized skeletal structures are unremarkable. IMPRESSION: No acute cardiopulmonary abnormality. Electronically Signed   By: Beryle Flock M.D.   On: 05/30/2022 09:36    Medications:  ceFEPime (MAXIPIME) IV Stopped (05/30/22 1857)   magnesium sulfate bolus IVPB     vancomycin      (  feeding supplement) PROSource Plus  30 mL Oral BID BM   acetaminophen  1,000 mg Oral Q8H   vitamin C  1,000 mg Oral Daily   carvedilol  3.125 mg Oral BID WC   Chlorhexidine Gluconate Cloth  6 each Topical Q0600   clopidogrel  75 mg Oral Daily   darbepoetin (ARANESP) injection - DIALYSIS  200 mcg Intravenous Q Fri-HD   dextrose       docusate sodium  100 mg Oral Daily   feeding supplement (NEPRO CARB STEADY)  237 mL Oral BID BM   heparin injection (subcutaneous)  5,000 Units Subcutaneous Q8H   hydrocortisone   Rectal BID   insulin aspart  0-9 Units Subcutaneous TID WC   insulin aspart  3 Units Subcutaneous TID WC   [START ON 06/01/2022] insulin glargine-yfgn  6 Units Subcutaneous Daily   levETIRAcetam  500 mg Oral QHS   losartan  100 mg Oral Daily   methylphenidate  5 mg  Oral BID WC   mometasone-formoterol  2 puff Inhalation BID   nutrition supplement (JUVEN)  1 packet Oral BID BM   pantoprazole  40 mg Oral Daily   rosuvastatin  10 mg Oral Daily   sevelamer carbonate  0.8 g Oral TID WC   zinc sulfate  220 mg Oral Daily    Dialysis Orders: MWF - [place] 4 hrs 180NRE 350/700 88.5 kg 2.0 K/2.0 Ca UFP 4 No heparin - Mircera 200 mcg  IV q 2wks - last 04/29/22 - Venofer '100mg'$  IV X 5 doses-new order, hasn't been given yet - Hectorol 79mg IV TIW   Assessment/Plan: 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 DSharol Given Previously off antibiotics, resumed empirically with rising WBC. WBC now trending down.  Acute Metabolic Encephalopathy/Intermittent Confusion: Seroquel stopped, minimizing sedatives. CT head negative. Cefepime discontinued. Waxing and waning. Managed by primary. ESRD: Continue HD on usual MWF schedule - next HD 8/9 Anemia of ESRD: Hgb 9.5, continue weekly Aranesp (2060m q Fri). Was on IV iron, stopped 8/5 b/c appears to have ongoing infection. Secondary hyperparathyroidism : COrrCa high, VDRA on hold. Continue Renvela as binder. HTN/volume: BP controlled, lowering volume as tolerated - only 1L UF with last HD d/t hypotension. Nutrition - Very low albumin, continue protein supplements.  T2DM: Per primary. Hx CVA: On dual ASA/plavix, and Keppra.   OgLynnda ChildA-C [facility] 05/31/2022,11:51 AM

## 2022-05-31 NOTE — Procedures (Signed)
Patient Name: Carolyn Russo  MRN: [id number]  Epilepsy Attending: [name]  Referring Physician/Provider: Mariel Aloe, MD  Date: 05/31/2022 Duration: 25.32 mins  Patient history: 71 year old female with altered mental status.  EEG to evaluate for seizure.  Level of alertness: Awake, asleep  AEDs during EEG study: LEV  Technical aspects: This EEG study was done with scalp electrodes positioned according to the 10-20 International system of electrode placement. Electrical activity was reviewed with band pass filter of 1-'70Hz'$ , sensitivity of 7 uV/mm, display speed of 27m/sec with a '60Hz'$  notched filter applied as appropriate. EEG data were recorded continuously and digitally stored.  Video monitoring was available and reviewed as appropriate.  Description: During awake state, no clear posterior dominant rhythm was seen.  Sleep was characterized by sleep spindles (12 to 14 Hz), maximal frontocentral region.  EEG showed continuous generalized 3 to 5 Hz theta-delta slowing.  Physiologic photic driving was not seen during photic stimulation.  Hyperventilation was not performed.     ABNORMALITY - Continuous slow, generalized  IMPRESSION: This study is suggestive of moderate diffuse encephalopathy, nonspecific etiology.  No seizures or epileptiform discharges were seen throughout the recording.  Jmya Uliano OBarbra Sarks

## 2022-05-31 NOTE — Progress Notes (Signed)
EEG complete - results pending 

## 2022-05-31 NOTE — Plan of Care (Signed)
No acute events overnight.    Problem: Education: Goal: Ability to describe self-care measures that may prevent or decrease complications (Diabetes Survival Skills Education) will improve Outcome: Progressing Goal: Individualized Educational Video(s) Outcome: Progressing   Problem: Coping: Goal: Ability to adjust to condition or change in health will improve Outcome: Progressing   Problem: Fluid Volume: Goal: Ability to maintain a balanced intake and output will improve Outcome: Progressing   Problem: Health Behavior/Discharge Planning: Goal: Ability to identify and utilize available resources and services will improve Outcome: Progressing Goal: Ability to manage health-related needs will improve Outcome: Progressing   Problem: Metabolic: Goal: Ability to maintain appropriate glucose levels will improve Outcome: Progressing   Problem: Nutritional: Goal: Maintenance of adequate nutrition will improve Outcome: Progressing Goal: Progress toward achieving an optimal weight will improve Outcome: Progressing   Problem: Skin Integrity: Goal: Risk for impaired skin integrity will decrease Outcome: Progressing   Problem: Tissue Perfusion: Goal: Adequacy of tissue perfusion will improve Outcome: Progressing   Problem: Education: Goal: Knowledge of General Education information will improve Description: Including pain rating scale, medication(s)/side effects and non-pharmacologic comfort measures Outcome: Progressing   Problem: Health Behavior/Discharge Planning: Goal: Ability to manage health-related needs will improve Outcome: Progressing   Problem: Clinical Measurements: Goal: Ability to maintain clinical measurements within normal limits will improve Outcome: Progressing Goal: Will remain free from infection Outcome: Progressing Goal: Diagnostic test results will improve Outcome: Progressing Goal: Respiratory complications will improve Outcome: Progressing Goal:  Cardiovascular complication will be avoided Outcome: Progressing   Problem: Activity: Goal: Risk for activity intolerance will decrease Outcome: Progressing   Problem: Nutrition: Goal: Adequate nutrition will be maintained Outcome: Progressing   Problem: Coping: Goal: Level of anxiety will decrease Outcome: Progressing   Problem: Elimination: Goal: Will not experience complications related to bowel motility Outcome: Progressing Goal: Will not experience complications related to urinary retention Outcome: Progressing   Problem: Pain Managment: Goal: General experience of comfort will improve Outcome: Progressing   Problem: Safety: Goal: Ability to remain free from injury will improve Outcome: Progressing   Problem: Skin Integrity: Goal: Risk for impaired skin integrity will decrease Outcome: Progressing   Problem: Education: Goal: Knowledge of the prescribed therapeutic regimen will improve Outcome: Progressing Goal: Ability to verbalize activity precautions or restrictions will improve Outcome: Progressing Goal: Understanding of discharge needs will improve Outcome: Progressing   Problem: Activity: Goal: Ability to perform//tolerate increased activity and mobilize with assistive devices will improve Outcome: Progressing   Problem: Clinical Measurements: Goal: Postoperative complications will be avoided or minimized Outcome: Progressing   Problem: Self-Care: Goal: Ability to meet self-care needs will improve Outcome: Progressing   Problem: Self-Concept: Goal: Ability to maintain and perform role responsibilities to the fullest extent possible will improve Outcome: Progressing   Problem: Pain Management: Goal: Pain level will decrease with appropriate interventions Outcome: Progressing   Problem: Skin Integrity: Goal: Demonstration of wound healing without infection will improve Outcome: Progressing

## 2022-05-31 NOTE — Progress Notes (Signed)
Pt noted to be awake and accompanied by family member. Cleaned pt and helped to get on bedpan

## 2022-05-31 NOTE — Progress Notes (Signed)
PT Cancellation Note  Patient Details Name: Carolyn Russo MRN: [id number] DOB: 1951-02-06   Cancelled Treatment:    Reason Eval/Treat Not Completed: Patient at procedure or test/unavailable  Undergoing EEG;   Will follow up later today as time allows;  Otherwise, will follow up for PT tomorrow;   Thank you,  Roney Marion, [place] [facility] [place] 05/31/2022, 11:38 AM

## 2022-05-31 NOTE — Progress Notes (Signed)
Pharmacy Antibiotic Note  [place] Carolyn Russo is a 71 y.o. 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 On 8/5 had worsening leukocytosis, possible surgical site infection.  Pharmacy has been consulted for vancomycin and zosyn dosing. Patient is on HD MWF.  Plan: Stop Cefepime Start Zosyn 2.25g Q8h Continue Vancomycin '750mg'$  IV MWF post HD  Trend WBC, fever, renal function F/u cultures, clinical progress, levels as indicated De-escalate when able   Height: '5\' 5"'$  (165.1 cm) Weight: 76.5 kg (168 lb 10.4 oz) IBW/kg (Calculated) : 57  Temp (24hrs), Avg:97.6 F (36.4 C), Min:97.4 F (36.3 C), Max:97.9 F (36.6 C)  Recent Labs  Lab 05/25/22 0909 05/27/22 0235 05/28/22 0130 05/29/22 0404 05/30/22 0737 05/30/22 1510 05/31/22 0820  WBC 24.1* 28.8* 35.1* 25.5* 19.1*  --  22.2*  CREATININE 7.44* 6.36*  --   --   --  8.67*  --     Estimated Creatinine Clearance: 5.9 mL/min (A) (by C-G formula based on SCr of 8.67 mg/dL (H)).    Allergies  Allergen Reactions   Food Anaphylaxis    Peanuts; Almonds   Wheat Bran     Upset stomach    Statins Itching and Other (See Comments)    Generalized aches- tolerates crestor    Pork-Derived Products     Does not eat pork    Shellfish Allergy Other (See Comments)    Mouth gets raw   Sitagliptin Other (See Comments)    Januiva - unknown reaction    Tetracycline Other (See Comments)    Raw mouth   Contrast Media [Iodinated Contrast Media] Rash    Happened during CT scan over 30 years ago    Antimicrobials this admission: Cefe 7/18 >> 7/25; 8/05>> Vanc 7/18 >> 7/25; 8/05 >>   Microbiology results: 7/24 BCx: NGTD 7/28 UCx: NGTD 7/28 Bcx: NGTD 7/25 MRSA PCR: neg 8/05 Bcx: bacillus species (aerobic bottle only) - prelim  Thank you for allowing pharmacy to be a part of this patient's care.  Ardyth Harps, PharmD Clinical Pharmacist

## 2022-06-01 DIAGNOSIS — Z8673 Personal history of transient ischemic attack (TIA), and cerebral infarction without residual deficits: Secondary | ICD-10-CM

## 2022-06-01 DIAGNOSIS — D72829 Elevated white blood cell count, unspecified: Secondary | ICD-10-CM | POA: Diagnosis not present

## 2022-06-01 DIAGNOSIS — R079 Chest pain, unspecified: Secondary | ICD-10-CM

## 2022-06-01 DIAGNOSIS — I6930 Unspecified sequelae of cerebral infarction: Secondary | ICD-10-CM

## 2022-06-01 LAB — COMPREHENSIVE METABOLIC PANEL
ALT: 14 U/L (ref 0–44)
AST: 14 U/L — ABNORMAL LOW (ref 15–41)
Albumin: 2.2 g/dL — ABNORMAL LOW (ref 3.5–5.0)
Alkaline Phosphatase: 55 U/L (ref 38–126)
Anion gap: 16 — ABNORMAL HIGH (ref 5–15)
BUN: 81 mg/dL — ABNORMAL HIGH (ref 8–23)
CO2: 20 mmol/L — ABNORMAL LOW (ref 22–32)
Calcium: 8.7 mg/dL — ABNORMAL LOW (ref 8.9–10.3)
Chloride: 92 mmol/L — ABNORMAL LOW (ref 98–111)
Creatinine, Ser: 9.67 mg/dL — ABNORMAL HIGH (ref 0.44–1.00)
GFR, Estimated: 4 mL/min — ABNORMAL LOW (ref >60)
Glucose, Bld: 162 mg/dL — ABNORMAL HIGH (ref 70–99)
Potassium: 4.4 mmol/L (ref 3.5–5.1)
Sodium: 128 mmol/L — ABNORMAL LOW (ref 135–145)
Total Bilirubin: 0.3 mg/dL (ref 0.3–1.2)
Total Protein: 5.3 g/dL — ABNORMAL LOW (ref 6.5–8.1)

## 2022-06-01 LAB — CBC WITH DIFFERENTIAL/PLATELET
Abs Immature Granulocytes: 0.24 10*3/uL — ABNORMAL HIGH (ref 0.00–0.07)
Basophils Absolute: 0.1 10*3/uL (ref 0.0–0.1)
Basophils Relative: 0 %
Eosinophils Absolute: 0.4 10*3/uL (ref 0.0–0.5)
Eosinophils Relative: 2 %
HCT: 24.8 % — ABNORMAL LOW (ref 36.0–46.0)
Hemoglobin: 8.7 g/dL — ABNORMAL LOW (ref 12.0–15.0)
Immature Granulocytes: 1 %
Lymphocytes Relative: 18 %
Lymphs Abs: 3.9 10*3/uL (ref 0.7–4.0)
MCH: 31.2 pg (ref 26.0–34.0)
MCHC: 35.1 g/dL (ref 30.0–36.0)
MCV: 88.9 fL (ref 80.0–100.0)
Monocytes Absolute: 2 10*3/uL — ABNORMAL HIGH (ref 0.1–1.0)
Monocytes Relative: 9 %
Neutro Abs: 15.2 10*3/uL — ABNORMAL HIGH (ref 1.7–7.7)
Neutrophils Relative %: 70 %
Platelets: 327 10*3/uL (ref 150–400)
RBC: 2.79 MIL/uL — ABNORMAL LOW (ref 3.87–5.11)
RDW: 23 % — ABNORMAL HIGH (ref 11.5–15.5)
WBC: 21.8 10*3/uL — ABNORMAL HIGH (ref 4.0–10.5)
nRBC: 0.5 % — ABNORMAL HIGH (ref 0.0–0.2)

## 2022-06-01 LAB — GLUCOSE, CAPILLARY
Glucose-Capillary: 111 mg/dL — ABNORMAL HIGH (ref 70–99)
Glucose-Capillary: 160 mg/dL — ABNORMAL HIGH (ref 70–99)
Glucose-Capillary: 78 mg/dL (ref 70–99)
Glucose-Capillary: 133 mg/dL — ABNORMAL HIGH (ref 70–99)

## 2022-06-01 MED ORDER — HEPARIN SODIUM (PORCINE) 1000 UNIT/ML IJ SOLN
INTRAMUSCULAR | Status: AC
  Administered 2022-06-01: 3000 [IU]
  Filled 2022-06-01: qty 3

## 2022-06-01 MED ORDER — HEPARIN SODIUM (PORCINE) 1000 UNIT/ML IJ SOLN
3000.0000 [IU] | Freq: Once | INTRAMUSCULAR | Status: AC
  Administered 2022-06-01: 3000 [IU] via INTRAVENOUS

## 2022-06-01 MED ORDER — THIAMINE MONONITRATE 100 MG PO TABS
100.0000 mg | ORAL_TABLET | Freq: Every day | ORAL | Status: DC
  Administered 2022-06-10: 100 mg via ORAL
  Filled 2022-06-01 (×2): qty 1

## 2022-06-01 MED ORDER — SODIUM CHLORIDE 0.9 % IV SOLN
250.0000 mg | Freq: Every day | INTRAVENOUS | Status: AC
Start: 2022-06-05 — End: 2022-06-09
  Administered 2022-06-05 – 2022-06-09 (×5): 250 mg via INTRAVENOUS
  Filled 2022-06-01 (×6): qty 2.5

## 2022-06-01 MED ORDER — HEPARIN SODIUM (PORCINE) 1000 UNIT/ML IJ SOLN
INTRAMUSCULAR | Status: AC
  Filled 2022-06-01: qty 3

## 2022-06-01 MED ORDER — SODIUM CHLORIDE 0.9 % IV SOLN
500.0000 mg | Freq: Three times a day (TID) | INTRAVENOUS | Status: AC
Start: 2022-06-01 — End: 2022-06-04
  Administered 2022-06-02 – 2022-06-04 (×9): 500 mg via INTRAVENOUS
  Filled 2022-06-01 (×9): qty 5

## 2022-06-01 NOTE — Assessment & Plan Note (Addendum)
Currently on RA CXR today favors atelectasis Per discussion with husband, this is chronic complaint, w/u further if recurrent or worsening

## 2022-06-01 NOTE — Assessment & Plan Note (Addendum)
SSI, hold basal/bolus with episode of hypoglycemia causing confusion a1c 4.6 Would d/c diabetes meds at discharge with a1c 4.6

## 2022-06-01 NOTE — Assessment & Plan Note (Addendum)
Initial workup without specific/reversible etiology identified. Possibly related to medication effect. Patient with slow but positive recovery of mental status function. On 8/8, patient found to be lethargic with Zuriyah Shatz blood sugar of 46. D50 given. Persistent stupor. Possible she could have had Avyonna Wagoner seizure from hypoglycemia with resultant post-ictal phase. Cefepime recently started which could also have contributed, but episode seems to have coincided with hypoglycemic episode. -ABG without hypercarbia -Ammonia 11 on 04/2022 -vitamin b12 wnl, folate wnl -EEG without seizures or epileptiform discharges -CT head without acute process -cefepime d/c'd -high dose thiamine -MRI with small acute infarcts in the L corona radiata and R parietal cortex -> per neurology, these don't explain encephalopathy, recommending LTM EEG for recurrent lethargy Improved mental status today

## 2022-06-01 NOTE — TOC Progression Note (Addendum)
Transition of Care [facility]) - Progression Note    Patient Details  Name: Carolyn Russo MRN: [id number] Date of Birth: Oct 04, 1951  Transition of Care [facility]) CM/SW Contact  Joanne Chars, [place] Phone Number: 06/01/2022, 11:35 AM  Clinical Narrative:    CSW reached out to the following SNFs: [facility]: not taking HD patients currently [facility]: cannot accept  HD patient currently [facility]: reviewing [facility]: does not accept HD patients [facility]: cannot accept HD pt currently  [facility] does offer bed,  pt husband informed, he will review and make a decision.   Expected Discharge Plan: [facility] (vs CIR vs SNF) Barriers to Discharge: Continued Medical Work up  Expected Discharge Plan and Services Expected Discharge Plan: [facility] (vs CIR vs SNF)   Discharge Planning Services: CM Consult                               HH Arranged: RN, PT, Speech Therapy (PTA active with [facility]) [facility]: [facility] Determinants of Health (SDOH) Interventions    Readmission Risk Interventions     No data to display

## 2022-06-01 NOTE — Assessment & Plan Note (Addendum)
plavix -> currently on aspirin and brilinta (see stroke)

## 2022-06-01 NOTE — Progress Notes (Signed)
[facility] Progress Note   Subjective:  Seen in room. Husband at bedside. Pleasant confusion this am.    Objective Vitals:   06/01/22 0140 06/01/22 0610 06/01/22 0746 06/01/22 0806  BP: (!) 136/54 (!) 156/76 139/61   Pulse: 71 71 67   Resp: '12 14 16   '$ Temp:   98.1 F (36.7 C)   TempSrc:   Oral   SpO2: 100% 99% 100% 100%  Weight:      Height:       Physical Exam General: Chronically ill appearing woman, room air. Heart: RRR; no murmur Lungs: Clear on left side, lying on right. Normal WOB.  Abdomen: soft Extremities: No LLE edema; R stump with clean bandage. Dialysis Access: [facility] in R chest  Additional Objective Labs: Basic Metabolic Panel: Recent Labs  Lab 05/27/22 0235 05/30/22 1510 05/31/22 1049  NA 132* 127* 128*  K 3.9 3.8 3.7  CL 93* 89*  --   CO2 23 23  --   GLUCOSE 128* 151*  --   BUN 46* 74*  --   CREATININE 6.36* 8.67*  --   CALCIUM 9.5 9.0  --   PHOS  --  5.3*  --     Liver Function Tests: Recent Labs  Lab 05/30/22 1510  ALBUMIN 2.5*    CBC: Recent Labs  Lab 05/27/22 0235 05/28/22 0130 05/29/22 0404 05/30/22 0737 05/31/22 0820 05/31/22 1049  WBC 28.8* 35.1* 25.5* 19.1* 22.2*  --   NEUTROABS  --   --  16.8*  --   --   --   HGB 9.5* 9.7* 9.1* 9.5* 9.7* 11.2*  HCT 28.7* 29.1* 27.1* 27.8* 28.2* 33.0*  MCV 88.9 89.8 88.6 89.1 89.0  --   PLT 360 325 318 356 339  --     Blood Culture    Component Value Date/Time   SDES BLOOD LEFT HAND 05/28/2022 1810   SDES BLOOD LEFT HAND 05/28/2022 1810   SPECREQUEST  05/28/2022 1810    BOTTLES DRAWN AEROBIC AND ANAEROBIC Blood Culture results may not be optimal due to an inadequate volume of blood received in culture bottles   SPECREQUEST  05/28/2022 1810    BOTTLES DRAWN AEROBIC AND ANAEROBIC Blood Culture adequate volume   CULT  05/28/2022 1810    NO GROWTH 4 DAYS Performed at [facility], [place] [address], [place], [place] [address]    CULT (A) 05/28/2022 1810     BACILLUS SPECIES Standardized susceptibility testing for this organism is not available. Performed at [facility], [place] [address], [place],  [address]    REPTSTATUS PENDING 05/28/2022 1810   REPTSTATUS 05/30/2022 FINAL 05/28/2022 1810   Studies/Results: CT HEAD WO CONTRAST (5MM)  Result Date: 05/31/2022 CLINICAL DATA:  Mental status changes EXAM: CT HEAD WITHOUT CONTRAST TECHNIQUE: Contiguous axial images were obtained from the base of the skull through the vertex without intravenous contrast. RADIATION DOSE REDUCTION: This exam was performed according to the departmental dose-optimization program which includes automated exposure control, adjustment of the mA and/or kV according to patient size and/or use of iterative reconstruction technique. COMPARISON:  Prior head CT 05/19/2022 FINDINGS: Brain: No evidence of acute infarction, hemorrhage, hydrocephalus, extra-axial collection or mass lesion/mass effect. Extensive periventricular white matter hypoattenuation similar compared to prior imaging consistent with advanced chronic microvascular ischemic white matter disease. Vascular: No hyperdense vessel or unexpected calcification. Skull: Normal. Negative for fracture or focal lesion. Sinuses/Orbits: No acute finding. Other: None. IMPRESSION: 1. No acute intracranial  process. 2. Similar appearance of advanced chronic microvascular ischemic white matter disease. Electronically Signed   By: Jacqulynn Cadet M.D.   On: 05/31/2022 13:01   EEG adult  Result Date: 05/31/2022 [name], MD     05/31/2022 12:20 PM Patient Name: Carolyn Russo MRN: [id number] Epilepsy Attending: [name] Referring Physician/Provider: Mariel Aloe, MD Date: 05/31/2022 Duration: 25.32 mins Patient history: 71 year old female with altered mental status.  EEG to evaluate for seizure. Level of alertness: Awake, asleep AEDs during EEG study: LEV Technical aspects: This EEG study was done with scalp electrodes  positioned according to the 10-20 International system of electrode placement. Electrical activity was reviewed with band pass filter of 1-'70Hz'$ , sensitivity of 7 uV/mm, display speed of 61m/sec with a '60Hz'$  notched filter applied as appropriate. EEG data were recorded continuously and digitally stored.  Video monitoring was available and reviewed as appropriate. Description: During awake state, no clear posterior dominant rhythm was seen.  Sleep was characterized by sleep spindles (12 to 14 Hz), maximal frontocentral region.  EEG showed continuous generalized 3 to 5 Hz theta-delta slowing.  Physiologic photic driving was not seen during photic stimulation.  Hyperventilation was not performed.   ABNORMALITY - Continuous slow, generalized IMPRESSION: This study is suggestive of moderate diffuse encephalopathy, nonspecific etiology.  No seizures or epileptiform discharges were seen throughout the recording. Priyanka OBarbra Sarks   Medications:  magnesium sulfate bolus IVPB     piperacillin-tazobactam (ZOSYN)  IV 2.25 g (06/01/22 0619)   vancomycin      (feeding supplement) PROSource Plus  30 mL Oral BID BM   acetaminophen  1,000 mg Oral Q8H   vitamin C  1,000 mg Oral Daily   carvedilol  3.125 mg Oral BID WC   Chlorhexidine Gluconate Cloth  6 each Topical Q0600   clopidogrel  75 mg Oral Daily   darbepoetin (ARANESP) injection - DIALYSIS  200 mcg Intravenous Q Fri-HD   docusate sodium  100 mg Oral Daily   feeding supplement (NEPRO CARB STEADY)  237 mL Oral BID BM   heparin injection (subcutaneous)  5,000 Units Subcutaneous Q8H   hydrocortisone   Rectal BID   insulin aspart  0-9 Units Subcutaneous TID WC   insulin aspart  3 Units Subcutaneous TID WC   insulin glargine-yfgn  6 Units Subcutaneous Daily   levETIRAcetam  500 mg Oral QHS   losartan  100 mg Oral Daily   methylphenidate  5 mg Oral BID WC   mometasone-formoterol  2 puff Inhalation BID   nutrition supplement (JUVEN)  1 packet Oral BID BM    pantoprazole  40 mg Oral Daily   rosuvastatin  10 mg Oral Daily   sevelamer carbonate  0.8 g Oral TID WC    [place] [place] 180[place] 350/700 88.5 kg 2.0 K/2.0 Ca UFP 4 No heparin - Mircera 200 mcg  IV q 2wks - last 04/29/22 - Venofer '100mg'$  IV X 5 doses-new order, hasn't been given yet - Hectorol 442m IV TIW   Assessment/Plan: 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 DuSharol GivenPreviously off antibiotics, resumed empirically with rising WBC. On IV Vanc/Zosyn per primary.  Acute Metabolic Encephalopathy/Intermittent Confusion: Seroquel stopped, minimizing sedatives. CT head negative. Cefepime discontinued. Waxing and waning. Managed by primary. ESRD: Continue HD on usual MWF schedule - HD today  Anemia of ESRD: Hgb 9.5, continue weekly Aranesp (20067mq Fri). Was on IV iron, stopped 8/5 b/c  appears to have ongoing infection. Secondary hyperparathyroidism : COrrCa high, VDRA on hold. Continue Renvela as binder. HTN/volume: BP controlled, lowering volume as tolerated - only 1L UF with last HD d/t hypotension. Nutrition - Very low albumin, continue protein supplements.  T2DM: Per primary. Hx CVA: On dual ASA/plavix, and Keppra.   Lynnda Child PA-C [facility] 06/01/2022,11:36 AM

## 2022-06-01 NOTE — Assessment & Plan Note (Addendum)
Resultant L hemiplegia, more notable weakness today per discussion with husband? ? Related to episode of hypoglycemia/AMS -> MRI as above

## 2022-06-01 NOTE — Assessment & Plan Note (Addendum)
Afebrile, currently on vanc/zosyn empirically -> monitoring off abx at this time Unclear cause at this time -> but gradually improving CXR 8/7 without acute findings Blood cx from 8/5 with bacillus species (suspected contaminant) Will repeat blood cx 8/10 NGx5

## 2022-06-01 NOTE — Progress Notes (Signed)
PROGRESS NOTE    Carolyn Russo  YTK:[id number] DOB: 30-Jun-1951 DOA: 05/10/2022 PCP: [name], MD  Chief Complaint  Patient presents with   Cellulitis    Brief Narrative:  Carolyn Russo is Pal Shell 71 y.o. female with Damani Rando history of ESRD on HD, CVA with left hemiplegia, seizures, diabetes, hypertension, hyperlipidemia, chronic systolic heart failure, PAD, GI bleed. Patient presented secondary to worsening right foot infection, found to have gangrene. Empiric antibiotics initiated and patient underwent Esmee Fallaw right BKA. During hospitalization, course has been complicated by altered mental status, in addition to presumed infection of unknown source. Currently resumed on antibiotics.    Assessment & Plan:   Principal Problem:   Leukocytosis Active Problems:   Gangrene of right foot (HCC)   PAD (peripheral artery disease) (HCC)   Encephalopathy   Shortness of breath   Chest pain   History of CVA (cerebrovascular accident)   Anemia in chronic kidney disease   ESRD on dialysis (HCC)   Type 2 diabetes mellitus (HCC)   Obesity (BMI 30-39.9)   Hypertensive disorder   Dyslipidemia (high LDL; low HDL)   CAD (coronary artery disease), native coronary artery   Asthma   Diabetic retinopathy (HCC)   Basal ganglia infarction (HCC)   Foot infection   Below-knee amputation of right lower extremity (HCC)   Seizures (HCC)   Assessment and Plan: * Leukocytosis Afebrile, currently on vanc/zosyn empirically Unclear cause at this time CXR 8/7 without acute findings Blood cx from 8/5 with bacillus species (suspected contaminant) Will repeat blood cx 8/10   Gangrene of right foot (HCC) Empiric Vancomycin and Cefepime initiated on admission. Vascular and orthopedic surgery consulted. Patient underwent right BKA on 7/26 with subsequent discontinuation of antibiotics. PT/OT consulted with recommendations for SNF, however family is hoping for possible discharge to CIR. Patient with some pain of her leg  but manageable. Wound vacuum removed on 8/6 for evaluation for infection (per Dr. Sharol Given on 8/7, no signs of infection).  PAD (peripheral artery disease) (HCC) plavix  Encephalopathy Initial workup without specific/reversible etiology identified. Possibly related to medication effect. Patient with slow but positive recovery of mental status function. On 8/8, patient found to be lethargic with Callan Norden blood sugar of 46. D50 given. Persistent stupor. Possible she could have had Keonda Dow seizure from hypoglycemia with resultant post-ictal phase. Cefepime recently started which could also have contributed, but episode seems to have coincided with hypoglycemic episode. -ABG without hypercarbia -EEG without seizures or epileptiform discharges -CT head without acute process -cefepime d/c'd -high dose thiamine -consider MRI if not improving  -May need to switch from Cefepime to Zosyn if no improvement  Shortness of breath Currently on RA  History of CVA (cerebrovascular accident) Resultant L hemiplegia, more notable weakness today per discussion with husband? ? Related to episode of hypoglycemia/AMS -> if no improvement, will follow MRI  Chest pain Reproducible per previous provider  ESRD on dialysis [facility]) Renal c/s, appreciate assistance  Anemia in chronic kidney disease trend  Type 2 diabetes mellitus (HCC) SSI, hold basal/bolus with episode of hypoglycemia causing confusion     DVT prophylaxis: heparin Code Status: full Family Communication: husband Disposition:   Status is: Inpatient Remains inpatient appropriate because: pending further improvement    Consultants:  renal  Procedures:  Right BKA (7/26)  Antimicrobials:  Anti-infectives (From admission, onward)    Start     Dose/Rate Route Frequency Ordered Stop   05/31/22 1430  piperacillin-tazobactam (ZOSYN) IVPB 2.25 g  2.25 g 100 mL/hr over 30 Minutes Intravenous Every 8 hours 05/31/22 1354     05/30/22 1200  vancomycin  (VANCOREADY) IVPB 750 mg/150 mL        750 mg 150 mL/hr over 60 Minutes Intravenous Every M-W-F (Hemodialysis) 05/28/22 1754     05/28/22 1845  vancomycin (VANCOREADY) IVPB 1250 mg/250 mL        1,250 mg 166.7 mL/hr over 90 Minutes Intravenous  Once 05/28/22 1754 05/28/22 2108   05/28/22 1845  ceFEPIme (MAXIPIME) 1 g in sodium chloride 0.9 % 100 mL IVPB  Status:  Discontinued        1 g 200 mL/hr over 30 Minutes Intravenous Every 24 hours 05/28/22 1754 05/31/22 1241   05/18/22 1300  ceFAZolin (ANCEF) IVPB 2g/100 mL premix  Status:  Discontinued        2 g 200 mL/hr over 30 Minutes Intravenous Every 8 hours 05/18/22 1212 05/18/22 1233   05/14/22 1130  vancomycin (VANCOCIN) IVPB 1000 mg/200 mL premix        1,000 mg 200 mL/hr over 60 Minutes Intravenous  Once 05/14/22 1038 05/14/22 1429   05/11/22 1900  ceFEPIme (MAXIPIME) 1 g in sodium chloride 0.9 % 100 mL IVPB  Status:  Discontinued        1 g 200 mL/hr over 30 Minutes Intravenous Every 24 hours 05/10/22 1715 05/18/22 1633   05/11/22 1200  vancomycin (VANCOCIN) IVPB 1000 mg/200 mL premix  Status:  Discontinued        1,000 mg 200 mL/hr over 60 Minutes Intravenous Every M-W-F (Hemodialysis) 05/10/22 1715 05/19/22 0925   05/10/22 1730  vancomycin (VANCOREADY) IVPB 1750 mg/350 mL        1,750 mg 175 mL/hr over 120 Minutes Intravenous  Once 05/10/22 1715 05/10/22 2151   05/10/22 1730  ceFEPIme (MAXIPIME) 2 g in sodium chloride 0.9 % 100 mL IVPB        2 g 200 mL/hr over 30 Minutes Intravenous  Once 05/10/22 1715 05/10/22 1840       Subjective: Eating lunch  Objective: Vitals:   06/01/22 1700 06/01/22 1730 06/01/22 1756 06/01/22 1845  BP: (!) 150/101 (!) 146/67 (!) 133/52 (!) 147/55  Pulse: 77 96 81 79  Resp: 14 18 (!) 22 20  Temp:   98.2 F (36.8 C) 98 F (36.7 C)  TempSrc:    Oral  SpO2: 99%  99% 99%  Weight:   78.2 kg   Height:        Intake/Output Summary (Last 24 hours) at 06/01/2022 1907 Last data filed at 06/01/2022  1756 Gross per 24 hour  Intake 120 ml  Output 1000 ml  Net -880 ml   Filed Weights   05/27/22 1313 06/01/22 1257 06/01/22 1756  Weight: 76.5 kg 79.4 kg 78.2 kg    Examination:  General exam: Appears calm and comfortable  Respiratory system: unlabored Cardiovascular system: RRR Gastrointestinal system: Abdomen is nondistended, soft and nontender.  Central nervous system: confused, doesn't consistently answer questions for me, L sided weakness Extremities: L sided weakness  Data Reviewed: I have personally reviewed following labs and imaging studies  CBC: Recent Labs  Lab 05/28/22 0130 05/29/22 0404 05/30/22 0737 05/31/22 0820 05/31/22 1049 06/01/22 1309  WBC 35.1* 25.5* 19.1* 22.2*  --  21.8*  NEUTROABS  --  16.8*  --   --   --  15.2*  HGB 9.7* 9.1* 9.5* 9.7* 11.2* 8.7*  HCT 29.1* 27.1* 27.8* 28.2* 33.0* 24.8*  MCV 89.8  88.6 89.1 89.0  --  88.9  PLT 325 318 356 339  --  696    Basic Metabolic Panel: Recent Labs  Lab 05/27/22 0235 05/30/22 1510 05/31/22 1049 06/01/22 1309  NA 132* 127* 128* 128*  K 3.9 3.8 3.7 4.4  CL 93* 89*  --  92*  CO2 23 23  --  20*  GLUCOSE 128* 151*  --  162*  BUN 46* 74*  --  81*  CREATININE 6.36* 8.67*  --  9.67*  CALCIUM 9.5 9.0  --  8.7*  PHOS  --  5.3*  --   --     GFR: Estimated Creatinine Clearance: 5.4 mL/min (Kynsli Haapala) (by C-G formula based on SCr of 9.67 mg/dL (H)).  Liver Function Tests: Recent Labs  Lab 05/30/22 1510 06/01/22 1309  AST  --  14*  ALT  --  14  ALKPHOS  --  55  BILITOT  --  0.3  PROT  --  5.3*  ALBUMIN 2.5* 2.2*    CBG: Recent Labs  Lab 05/31/22 1628 05/31/22 2010 06/01/22 0748 06/01/22 1215 06/01/22 1843  GLUCAP 110* 161* 111* 160* 78     Recent Results (from the past 240 hour(s))  Culture, blood (Routine X 2) w Reflex to ID Panel     Status: None (Preliminary result)   Collection Time: 05/28/22  6:10 PM   Specimen: BLOOD LEFT HAND  Result Value Ref Range Status   Specimen Description  BLOOD LEFT HAND  Final   Special Requests   Final    BOTTLES DRAWN AEROBIC AND ANAEROBIC Blood Culture results may not be optimal due to an inadequate volume of blood received in culture bottles   Culture   Final    NO GROWTH 4 DAYS Performed at [facility], [place] [address], [place], [place] [address]    Report Status PENDING  Incomplete  Culture, blood (Routine X 2) w Reflex to ID Panel     Status: Abnormal   Collection Time: 05/28/22  6:10 PM   Specimen: BLOOD LEFT HAND  Result Value Ref Range Status   Specimen Description BLOOD LEFT HAND  Final   Special Requests   Final    BOTTLES DRAWN AEROBIC AND ANAEROBIC Blood Culture adequate volume   Culture  Setup Time   Final    GRAM POSITIVE RODS AEROBIC BOTTLE ONLY CRITICAL RESULT CALLED TO, READ BACK BY AND VERIFIED WITH: PHARMD BEN.M AT 0919 ON 05/28/2022 BY T.SAAD.    Culture (Franny Selvage)  Final    BACILLUS SPECIES Standardized susceptibility testing for this organism is not available. Performed at [facility], [place] [address] [place], [place] [address]    Report Status 05/30/2022 FINAL  Final         Radiology Studies: CT HEAD WO CONTRAST (5MM)  Result Date: 05/31/2022 CLINICAL DATA:  Mental status changes EXAM: CT HEAD WITHOUT CONTRAST TECHNIQUE: Contiguous axial images were obtained from the base of the skull through the vertex without intravenous contrast. RADIATION DOSE REDUCTION: This exam was performed according to the departmental dose-optimization program which includes automated exposure control, adjustment of the mA and/or kV according to patient size and/or use of iterative reconstruction technique. COMPARISON:  Prior head CT 05/19/2022 FINDINGS: Brain: No evidence of acute infarction, hemorrhage, hydrocephalus, extra-axial collection or mass lesion/mass effect. Extensive periventricular white matter hypoattenuation similar compared to prior imaging consistent with advanced chronic microvascular ischemic white matter  disease. Vascular: No hyperdense vessel or unexpected calcification. Skull:  Normal. Negative for fracture or focal lesion. Sinuses/Orbits: No acute finding. Other: None. IMPRESSION: 1. No acute intracranial process. 2. Similar appearance of advanced chronic microvascular ischemic white matter disease. Electronically Signed   By: Jacqulynn Cadet M.D.   On: 05/31/2022 13:01   EEG adult  Result Date: 05/31/2022 [name], MD     05/31/2022 12:20 PM Patient Name: MILANI LOWENSTEIN MRN: [id number] Epilepsy Attending: [name] Referring Physician/Provider: Mariel Aloe, MD Date: 05/31/2022 Duration: 25.32 mins Patient history: 72 year old female with altered mental status.  EEG to evaluate for seizure. Level of alertness: Awake, asleep AEDs during EEG study: LEV Technical aspects: This EEG study was done with scalp electrodes positioned according to the 10-20 International system of electrode placement. Electrical activity was reviewed with band pass filter of 1-'70Hz'$ , sensitivity of 7 uV/mm, display speed of 45m/sec with Annalise Mcdiarmid '60Hz'$  notched filter applied as appropriate. EEG data were recorded continuously and digitally stored.  Video monitoring was available and reviewed as appropriate. Description: During awake state, no clear posterior dominant rhythm was seen.  Sleep was characterized by sleep spindles (12 to 14 Hz), maximal frontocentral region.  EEG showed continuous generalized 3 to 5 Hz theta-delta slowing.  Physiologic photic driving was not seen during photic stimulation.  Hyperventilation was not performed.   ABNORMALITY - Continuous slow, generalized IMPRESSION: This study is suggestive of moderate diffuse encephalopathy, nonspecific etiology.  No seizures or epileptiform discharges were seen throughout the recording. Priyanka OBarbra Sarks       Scheduled Meds:  (feeding supplement) PROSource Plus  30 mL Oral BID BM   acetaminophen  1,000 mg Oral Q8H   vitamin C  1,000 mg Oral Daily    carvedilol  3.125 mg Oral BID WC   Chlorhexidine Gluconate Cloth  6 each Topical Q0600   clopidogrel  75 mg Oral Daily   darbepoetin (ARANESP) injection - DIALYSIS  200 mcg Intravenous Q Fri-HD   docusate sodium  100 mg Oral Daily   feeding supplement (NEPRO CARB STEADY)  237 mL Oral BID BM   heparin injection (subcutaneous)  5,000 Units Subcutaneous Q8H   heparin sodium (porcine)       hydrocortisone   Rectal BID   insulin aspart  0-9 Units Subcutaneous TID WC   levETIRAcetam  500 mg Oral QHS   losartan  100 mg Oral Daily   methylphenidate  5 mg Oral BID WC   mometasone-formoterol  2 puff Inhalation BID   nutrition supplement (JUVEN)  1 packet Oral BID BM   pantoprazole  40 mg Oral Daily   rosuvastatin  10 mg Oral Daily   sevelamer carbonate  0.8 g Oral TID WC   [START ON 06/10/2022] thiamine  100 mg Oral Daily   Continuous Infusions:  magnesium sulfate bolus IVPB     piperacillin-tazobactam (ZOSYN)  IV 2.25 g (06/01/22 1850)   thiamine (VITAMIN B1) injection     Followed by   [Derrill MemoON 06/05/2022] thiamine (VITAMIN B1) injection     vancomycin 750 mg (06/01/22 1619)     LOS: 22 days    Time spent: over 30 min    CFayrene Helper MD [facility]   To contact the attending provider between 7A-7P or the covering provider during after hours 7P-7A, please log into the web site [email or web address] and access using universal [place] password for that web site. If you do not have the password, please call the hospital operator.  06/01/2022, 7:07 PM

## 2022-06-01 NOTE — Hospital Course (Addendum)
Carolyn Russo is Carolyn Russo 71 y.o. female with [name] history of ESRD on HD, CVA with left hemiplegia, seizures, diabetes, hypertension, hyperlipidemia, chronic systolic heart failure, PAD, GI bleed. Patient presented secondary to worsening right foot infection, found to have gangrene. Empiric antibiotics initiated and patient underwent Emiliano Welshans right BKA. During hospitalization, course has been complicated by altered mental status, which is currently improving.  At this time, plan is for discharge to SNF, though Dr. [name] (husband) remains interested in inpatient rehab if this were to become an option.

## 2022-06-01 NOTE — Assessment & Plan Note (Signed)
Reproducible per previous provider

## 2022-06-01 NOTE — Assessment & Plan Note (Signed)
Renal c/s, appreciate assistance

## 2022-06-01 NOTE — Progress Notes (Addendum)
Received patient in bed to unit.  Alert and confused.  Informed consent signed and in  chart.   Treatment initiated: 1311 Treatment completed: 1730  Patient tolerated well. Dialyzer and system changed out once d/t clotting  Transported back to the room  Alert, confused and without acute distress.  Hand-off given to patient's nurse.   Access used: HD catheter right chest Access issues: none  Total UF removed: 1 liter Medication(s) given: vancomycin, heparin  Post HD VS: 133/52 HR 81 sat 99% room air RR 22 Temp oral 98.2 Post HD weight: 78.2 kg   [facility]

## 2022-06-01 NOTE — Assessment & Plan Note (Signed)
Empiric Vancomycin and Cefepime initiated on admission. Vascular and orthopedic surgery consulted. Patient underwent right BKA on 7/26 with subsequent discontinuation of antibiotics. PT/OT consulted with recommendations for SNF, however family is hoping for possible discharge to CIR. Patient with some pain of her leg but manageable. Wound vacuum removed on 8/6 for evaluation for infection (per Dr. Sharol Given on 8/7, no signs of infection).

## 2022-06-01 NOTE — Assessment & Plan Note (Signed)
trend

## 2022-06-02 DIAGNOSIS — E1165 Type 2 diabetes mellitus with hyperglycemia: Secondary | ICD-10-CM | POA: Diagnosis not present

## 2022-06-02 DIAGNOSIS — D72829 Elevated white blood cell count, unspecified: Secondary | ICD-10-CM | POA: Diagnosis not present

## 2022-06-02 LAB — COMPREHENSIVE METABOLIC PANEL
ALT: 15 U/L (ref 0–44)
AST: 15 U/L (ref 15–41)
Albumin: 2.3 g/dL — ABNORMAL LOW (ref 3.5–5.0)
Alkaline Phosphatase: 58 U/L (ref 38–126)
Anion gap: 15 (ref 5–15)
BUN: 26 mg/dL — ABNORMAL HIGH (ref 8–23)
CO2: 24 mmol/L (ref 22–32)
Calcium: 8.4 mg/dL — ABNORMAL LOW (ref 8.9–10.3)
Chloride: 92 mmol/L — ABNORMAL LOW (ref 98–111)
Creatinine, Ser: 4.58 mg/dL — ABNORMAL HIGH (ref 0.44–1.00)
GFR, Estimated: 10 mL/min — ABNORMAL LOW (ref >60)
Glucose, Bld: 82 mg/dL (ref 70–99)
Potassium: 3.4 mmol/L — ABNORMAL LOW (ref 3.5–5.1)
Sodium: 131 mmol/L — ABNORMAL LOW (ref 135–145)
Total Bilirubin: 0.9 mg/dL (ref 0.3–1.2)
Total Protein: 6.1 g/dL — ABNORMAL LOW (ref 6.5–8.1)

## 2022-06-02 LAB — GLUCOSE, CAPILLARY
Glucose-Capillary: 107 mg/dL — ABNORMAL HIGH (ref 70–99)
Glucose-Capillary: 225 mg/dL — ABNORMAL HIGH (ref 70–99)
Glucose-Capillary: 118 mg/dL — ABNORMAL HIGH (ref 70–99)
Glucose-Capillary: 128 mg/dL — ABNORMAL HIGH (ref 70–99)

## 2022-06-02 LAB — TSH: TSH: 2.095 u[IU]/mL (ref 0.350–4.500)

## 2022-06-02 LAB — CBC WITH DIFFERENTIAL/PLATELET
Abs Immature Granulocytes: 0 10*3/uL (ref 0.00–0.07)
Basophils Absolute: 0.2 10*3/uL — ABNORMAL HIGH (ref 0.0–0.1)
Basophils Relative: 1 %
Eosinophils Absolute: 0.2 10*3/uL (ref 0.0–0.5)
Eosinophils Relative: 1 %
HCT: 25.6 % — ABNORMAL LOW (ref 36.0–46.0)
Hemoglobin: 9 g/dL — ABNORMAL LOW (ref 12.0–15.0)
Lymphocytes Relative: 24 %
Lymphs Abs: 3.9 10*3/uL (ref 0.7–4.0)
MCH: 31 pg (ref 26.0–34.0)
MCHC: 35.2 g/dL (ref 30.0–36.0)
MCV: 88.3 fL (ref 80.0–100.0)
Monocytes Absolute: 0.7 10*3/uL (ref 0.1–1.0)
Monocytes Relative: 4 %
Neutro Abs: 11.5 10*3/uL — ABNORMAL HIGH (ref 1.7–7.7)
Neutrophils Relative %: 70 %
Platelets: 339 10*3/uL (ref 150–400)
RBC: 2.9 MIL/uL — ABNORMAL LOW (ref 3.87–5.11)
RDW: 23.1 % — ABNORMAL HIGH (ref 11.5–15.5)
WBC: 16.4 10*3/uL — ABNORMAL HIGH (ref 4.0–10.5)
nRBC: 0.5 % — ABNORMAL HIGH (ref 0.0–0.2)
nRBC: 1 /100 WBC — ABNORMAL HIGH

## 2022-06-02 LAB — MAGNESIUM: Magnesium: 1.6 mg/dL — ABNORMAL LOW (ref 1.7–2.4)

## 2022-06-02 LAB — CULTURE, BLOOD (ROUTINE X 2): Culture: NO GROWTH

## 2022-06-02 LAB — PHOSPHORUS: Phosphorus: 3.8 mg/dL (ref 2.5–4.6)

## 2022-06-02 NOTE — Progress Notes (Signed)
Physical Therapy Treatment Patient Details Name: Carolyn Russo MRN: [id number] DOB: Jul 13, 1951 Today's Date: 06/02/2022   History of Present Illness 71 y/o female presented to ED on 05/10/22 for R foot infection after 1st ray amputation x 1 month ago. S/p R BKA on 7/26. PMH: diabetes, hypertension, hyperlipidemia, ESRD on HD MWF, hx of CVA 03/2021    PT Comments    Pt was seen for visit to increase active functional movement, but has some significant lethargy today.  Worked instead on getting active LE function with pt after her transfer to the chair, but also is requiring direct assistance for the completion of any movement.  Her tolerance to sit up is minimal, so is requiring continual support to sit in all directions.  Follow up with her for further strengthening as able, with core strength and LE strength being the direction of PT.  Work toward acute PT goals as are outlined on POC.   Recommendations for follow up therapy are one component of a multi-disciplinary discharge planning process, led by the attending physician.  Recommendations may be updated based on patient status, additional functional criteria and insurance authorization.  Follow Up Recommendations  Skilled nursing-short term rehab (<3 hours/day) Can patient physically be transported by private vehicle: No   Assistance Recommended at Discharge Frequent or constant Supervision/Assistance  Patient can return home with the following Two people to help with walking and/or transfers;Two people to help with bathing/dressing/bathroom;Assistance with feeding;Direct supervision/assist for medications management;Direct supervision/assist for financial management;Assist for transportation   Equipment Recommendations  Hospital bed    Recommendations for Other Services       Precautions / Restrictions Precautions Precautions: Fall Precaution Comments: limb protector Restrictions Weight Bearing Restrictions: Yes RLE Weight  Bearing: Non weight bearing Other Position/Activity Restrictions: recommend elevation of RLE     Mobility  Bed Mobility Overal bed mobility: Needs Assistance   Rolling: Total assist         General bed mobility comments: up with maximove with OT    Transfers Overall transfer level: Needs assistance                 General transfer comment: up with OT using maximove to chair from bed Transfer via Lift Equipment: Maximove  Ambulation/Gait               General Gait Details: unable to stand and take a step   Stairs             Wheelchair Mobility    Modified Rankin (Stroke Patients Only)       Balance Overall balance assessment: Needs assistance Sitting-balance support: Bilateral upper extremity supported, Single extremity supported Sitting balance-Leahy Scale: Poor                                      Cognition Arousal/Alertness: Awake/alert, Lethargic Behavior During Therapy: Flat affect Overall Cognitive Status: Impaired/Different from baseline Area of Impairment: Awareness, Following commands, Attention, Memory                 Orientation Level: Situation, Time, Place Current Attention Level: Selective Memory: Decreased recall of precautions, Decreased short-term memory Following Commands: Follows one step commands with increased time   Awareness: Intellectual   General Comments: pt requires direct assistance on RLE or LLE to do anything with exercises        Exercises General Exercises - Lower Extremity Ankle Circles/Pumps:  AAROM, Left Quad Sets: AROM, AAROM, 5 reps Heel Slides: AAROM, Both, 10 reps Hip ABduction/ADduction: AAROM, Both, 10 reps Straight Leg Raises: AAROM, Both, 10 reps    General Comments General comments (skin integrity, edema, etc.): pt's husband left as PT was beginning so did not have access to new information.  Pt is lethargic then quietly alert      Pertinent Vitals/Pain Pain  Assessment Pain Assessment: Faces Faces Pain Scale: Hurts little more Negative Vocalization: occasional moan/groan, low speech, negative/disapproving quality Pain Location: movement on RLE Pain Descriptors / Indicators: Guarding, Restless Pain Intervention(s): Limited activity within patient's tolerance, Monitored during session, Premedicated before session, Repositioned    Home Living                          Prior Function            PT Goals (current goals can now be found in the care plan section) Acute Rehab PT Goals Patient Stated Goal: none stated Progress towards PT goals: Progressing toward goals    Frequency    Min 2X/week      PT Plan Current plan remains appropriate    Co-evaluation              AM-PAC PT "6 Clicks" Mobility   Outcome Measure  Help needed turning from your back to your side while in a flat bed without using bedrails?: Total Help needed moving from lying on your back to sitting on the side of a flat bed without using bedrails?: Total Help needed moving to and from a bed to a chair (including a wheelchair)?: Total Help needed standing up from a chair using your arms (e.g., wheelchair or bedside chair)?: Total Help needed to walk in hospital room?: Total Help needed climbing 3-5 steps with a railing? : Total 6 Click Score: 6    End of Session Equipment Utilized During Treatment: Other (comment) (pillows and repositioning) Activity Tolerance: Patient tolerated treatment well Patient left: in chair;with call bell/phone within reach;with family/visitor present Nurse Communication: Mobility status PT Visit Diagnosis: Muscle weakness (generalized) (M62.81);Other abnormalities of gait and mobility (R26.89)     Time: 9390-3009 PT Time Calculation (min) (ACUTE ONLY): 24 min  Charges:  $Therapeutic Exercise: 8-22 mins $Therapeutic Activity: 8-22 mins        Ramond Dial 06/02/2022, 3:47 PM  Mee Hives, PT PhD Acute Rehab  Dept. Number: [place] and [place]

## 2022-06-02 NOTE — Progress Notes (Signed)
Occupational Therapy Treatment Patient Details Name: Carolyn Russo MRN: [id number] DOB: 02/01/1951 Today's Date: 06/02/2022   History of present illness 71 y/o female presented to ED on 05/10/22 for R foot infection after 1st ray amputation x 1 month ago. S/p R BKA on 7/26. PMH: diabetes, hypertension, hyperlipidemia, ESRD on HD MWF, hx of CVA 03/2021   OT comments  Session focused on OOB transfer via lift to facilitate optimal posture for self feeding tasks. Pt continues to be lethargic with delayed responses and inconsistent command following. Once in chair, pt appears more alert and more interactive with husband who was present. Educated on R residual limb positioning and knee extension exercises with limited carryover today. Due to cognitive deficits, pt continues to require Max A for self feeding. Continue to rec SNF rehab at [place] as pt below functional baseline.   Recommendations for follow up therapy are one component of a multi-disciplinary discharge planning process, led by the attending physician.  Recommendations may be updated based on patient status, additional functional criteria and insurance authorization.    Follow Up Recommendations  Skilled nursing-short term rehab (<3 hours/day)    Assistance Recommended at Discharge Frequent or constant Supervision/Assistance  Patient can return home with the following  Two people to help with bathing/dressing/bathroom;Two people to help with walking and/or transfers;Assistance with feeding;Assistance with cooking/housework;Assist for transportation;Help with stairs or ramp for entrance;Direct supervision/assist for financial management;Direct supervision/assist for medications management   Equipment Recommendations  Hospital bed    Recommendations for Other Services      Precautions / Restrictions Precautions Precautions: Fall Restrictions Weight Bearing Restrictions: Yes RLE Weight Bearing: Non weight bearing       Mobility Bed  Mobility Overal bed mobility: Needs Assistance Bed Mobility: Rolling Rolling: Total assist, +2 for physical assistance, +2 for safety/equipment         General bed mobility comments: poor command following and delayed responses    Transfers Overall transfer level: Needs assistance Equipment used: Ambulation equipment used Transfers: Bed to chair/wheelchair/BSC             General transfer comment: bed > chair Transfer via Lift Equipment: Maximove   Balance                                           ADL either performed or assessed with clinical judgement   ADL Overall ADL's : Needs assistance/impaired Eating/Feeding: Maximal assistance;Sitting Eating/Feeding Details (indicate cue type and reason): husband initially feeding pt. Once in recliner, pt in better position though poor intiiation. assist to scoop ice on spoon, place in hand and provided HOH to face                                        Extremity/Trunk Assessment Upper Extremity Assessment Upper Extremity Assessment: RUE deficits/detail;LUE deficits/detail RUE Deficits / Details: hx of deficits from HD cath placement with coordination deficits at baseline RUE Coordination: decreased fine motor;decreased gross motor LUE Deficits / Details: hx of CVA affecting this UE, husband reports elbow stiff but this became pt's dominant UE after HD cath impacted R UE function LUE Coordination: decreased fine motor;decreased gross motor   Lower Extremity Assessment Lower Extremity Assessment: Defer to PT evaluation        Vision   Vision Assessment?:  No apparent visual deficits   Perception     Praxis      Cognition Arousal/Alertness: Awake/alert, Lethargic Behavior During Therapy: Flat affect Overall Cognitive Status: Impaired/Different from baseline Area of Impairment: Orientation, Attention, Following commands, Awareness                 Orientation Level: Disoriented  to, Place, Time, Situation Current Attention Level: Focused   Following Commands: Follows one step commands inconsistently, Follows one step commands with increased time   Awareness: Intellectual   General Comments: difficulty following commands, lethargic initially more more awaken once in chair. HOH may also be impacting command following        Exercises      Shoulder Instructions       General Comments Husband present, supportive. EKG entering during session    Pertinent Vitals/ Pain       Pain Assessment Pain Assessment: Faces Faces Pain Scale: Hurts little more Pain Location: bottom Pain Descriptors / Indicators: Grimacing Pain Intervention(s): Monitored during session, Repositioned, Other (comment) (geomat in chair)  Home Living                                          Prior Functioning/Environment              Frequency  Min 2X/week        Progress Toward Goals  OT Goals(current goals can now be found in the care plan section)  Progress towards OT goals: OT to reassess next treatment  Acute Rehab OT Goals Patient Stated Goal: husband would like for pt to get OOB OT Goal Formulation: Patient unable to participate in goal setting Time For Goal Achievement: 06/16/22 Potential to Achieve Goals: [place] Discharge plan remains appropriate;Frequency remains appropriate    Co-evaluation                 AM-PAC OT "6 Clicks" Daily Activity     Outcome Measure   Help from another person eating meals?: A Lot Help from another person taking care of personal grooming?: Total Help from another person toileting, which includes using toliet, bedpan, or urinal?: Total Help from another person bathing (including washing, rinsing, drying)?: Total Help from another person to put on and taking off regular upper body clothing?: Total Help from another person to put on and taking off regular lower body clothing?: Total 6 Click Score:  7    End of Session    OT Visit Diagnosis: Unsteadiness on feet (R26.81);Muscle weakness (generalized) (M62.81)   Activity Tolerance Patient tolerated treatment well;Patient limited by fatigue   Patient Left in chair;with call bell/phone within reach;Other (comment) (located alarm box though would not activate, likely batteries - RN aware)   Nurse Communication Mobility status;Need for lift equipment        Time: 1320-1400 OT Time Calculation (min): 40 min  Charges: OT General Charges $OT Visit: 1 Visit OT Treatments $Self Care/Home Management : 8-22 mins $Therapeutic Activity: 23-37 mins  Malachy Chamber, OTR/L Acute Rehab Services Office: [phone]   Layla Maw 06/02/2022, 2:13 PM

## 2022-06-02 NOTE — Progress Notes (Signed)
Notified by CSW that pt has been accepted at snf. Contacted nephrologist to inquire if pt should receive HD in chair tomorrow to ensure pt can tolerate. Nephrologist feels if pt can sit in chair in room today that should be adequate. Pt's RN and therapy staff added to secure chat to be made aware of this request.   Melven Sartorius Renal Navigator [phone]

## 2022-06-02 NOTE — Progress Notes (Signed)
[facility] Progress Note   Subjective:  Completed HD yesterday with minimal UF. Seen in room. Sleeping, wakes briefly. Confused.    Objective Vitals:   06/02/22 0000 06/02/22 0422 06/02/22 0813 06/02/22 0936  BP: 129/87 (!) 134/57  (!) 114/97  Pulse:  74  82  Resp: 20 20    Temp:  98.8 F (37.1 C)  97.9 F (36.6 C)  TempSrc:  Oral  Oral  SpO2: 99% 99% 97%   Weight:      Height:       Physical Exam General: Chronically ill appearing woman, room air. Heart: RRR; no murmur Lungs: Clear on left side, lying on right. Normal WOB.  Abdomen: soft Extremities: No LLE edema; R stump with clean bandage. Dialysis Access: [facility] in R chest  Additional Objective Labs: Basic Metabolic Panel: Recent Labs  Lab 05/30/22 1510 05/31/22 1049 06/01/22 1309 06/02/22 0702  NA 127* 128* 128* 131*  K 3.8 3.7 4.4 3.4*  CL 89*  --  92* 92*  CO2 23  --  20* 24  GLUCOSE 151*  --  162* 82  BUN 74*  --  81* 26*  CREATININE 8.67*  --  9.67* 4.58*  CALCIUM 9.0  --  8.7* 8.4*  PHOS 5.3*  --   --  3.8    Liver Function Tests: Recent Labs  Lab 05/30/22 1510 06/01/22 1309 06/02/22 0702  AST  --  14* 15  ALT  --  14 15  ALKPHOS  --  55 58  BILITOT  --  0.3 0.9  PROT  --  5.3* 6.1*  ALBUMIN 2.5* 2.2* 2.3*    CBC: Recent Labs  Lab 05/29/22 0404 05/30/22 0737 05/31/22 0820 05/31/22 1049 06/01/22 1309 06/02/22 0702  WBC 25.5* 19.1* 22.2*  --  21.8* 16.4*  NEUTROABS 16.8*  --   --   --  15.2* 11.5*  HGB 9.1* 9.5* 9.7* 11.2* 8.7* 9.0*  HCT 27.1* 27.8* 28.2* 33.0* 24.8* 25.6*  MCV 88.6 89.1 89.0  --  88.9 88.3  PLT 318 356 339  --  327 339    Blood Culture    Component Value Date/Time   SDES BLOOD LEFT HAND 05/28/2022 1810   SDES BLOOD LEFT HAND 05/28/2022 1810   SPECREQUEST  05/28/2022 1810    BOTTLES DRAWN AEROBIC AND ANAEROBIC Blood Culture results may not be optimal due to an inadequate volume of blood received in culture bottles   SPECREQUEST  05/28/2022  1810    BOTTLES DRAWN AEROBIC AND ANAEROBIC Blood Culture adequate volume   CULT  05/28/2022 1810    NO GROWTH 5 DAYS Performed at [facility], [place] [address], [place], [place] [address]    CULT (A) 05/28/2022 1810    BACILLUS SPECIES Standardized susceptibility testing for this organism is not available. Performed at [facility], [place] [address], [place], [place] [address]    REPTSTATUS 06/02/2022 FINAL 05/28/2022 1810   REPTSTATUS 05/30/2022 FINAL 05/28/2022 1810   Studies/Results: CT HEAD WO CONTRAST (5MM)  Result Date: 05/31/2022 CLINICAL DATA:  Mental status changes EXAM: CT HEAD WITHOUT CONTRAST TECHNIQUE: Contiguous axial images were obtained from the base of the skull through the vertex without intravenous contrast. RADIATION DOSE REDUCTION: This exam was performed according to the departmental dose-optimization program which includes automated exposure control, adjustment of the mA and/or kV according to patient size and/or use of iterative reconstruction technique. COMPARISON:  Prior head CT 05/19/2022 FINDINGS: Brain: No evidence of  acute infarction, hemorrhage, hydrocephalus, extra-axial collection or mass lesion/mass effect. Extensive periventricular white matter hypoattenuation similar compared to prior imaging consistent with advanced chronic microvascular ischemic white matter disease. Vascular: No hyperdense vessel or unexpected calcification. Skull: Normal. Negative for fracture or focal lesion. Sinuses/Orbits: No acute finding. Other: None. IMPRESSION: 1. No acute intracranial process. 2. Similar appearance of advanced chronic microvascular ischemic white matter disease. Electronically Signed   By: Jacqulynn Cadet M.D.   On: 05/31/2022 13:01   EEG adult  Result Date: 05/31/2022 [name], MD     05/31/2022 12:20 PM Patient Name: Carolyn Russo MRN: [id number] Epilepsy Attending: [name] Referring Physician/Provider: Mariel Aloe, MD Date: 05/31/2022  Duration: 25.32 mins Patient history: 71 year old female with altered mental status.  EEG to evaluate for seizure. Level of alertness: Awake, asleep AEDs during EEG study: LEV Technical aspects: This EEG study was done with scalp electrodes positioned according to the 10-20 International system of electrode placement. Electrical activity was reviewed with band pass filter of 1-'70Hz'$ , sensitivity of 7 uV/mm, display speed of 15m/sec with a '60Hz'$  notched filter applied as appropriate. EEG data were recorded continuously and digitally stored.  Video monitoring was available and reviewed as appropriate. Description: During awake state, no clear posterior dominant rhythm was seen.  Sleep was characterized by sleep spindles (12 to 14 Hz), maximal frontocentral region.  EEG showed continuous generalized 3 to 5 Hz theta-delta slowing.  Physiologic photic driving was not seen during photic stimulation.  Hyperventilation was not performed.   ABNORMALITY - Continuous slow, generalized IMPRESSION: This study is suggestive of moderate diffuse encephalopathy, nonspecific etiology.  No seizures or epileptiform discharges were seen throughout the recording. Priyanka OBarbra Sarks   Medications:  magnesium sulfate bolus IVPB     piperacillin-tazobactam (ZOSYN)  IV 2.25 g (06/02/22 0518)   thiamine (VITAMIN B1) injection 500 mg (06/02/22 0645)   Followed by   [Derrill MemoON 06/05/2022] thiamine (VITAMIN B1) injection     vancomycin Stopped (06/01/22 1719)    (feeding supplement) PROSource Plus  30 mL Oral BID BM   acetaminophen  1,000 mg Oral Q8H   vitamin C  1,000 mg Oral Daily   carvedilol  3.125 mg Oral BID WC   Chlorhexidine Gluconate Cloth  6 each Topical Q0600   clopidogrel  75 mg Oral Daily   darbepoetin (ARANESP) injection - DIALYSIS  200 mcg Intravenous Q Fri-HD   docusate sodium  100 mg Oral Daily   feeding supplement (NEPRO CARB STEADY)  237 mL Oral BID BM   heparin injection (subcutaneous)  5,000 Units Subcutaneous  Q8H   hydrocortisone   Rectal BID   insulin aspart  0-9 Units Subcutaneous TID WC   levETIRAcetam  500 mg Oral QHS   losartan  100 mg Oral Daily   methylphenidate  5 mg Oral BID WC   mometasone-formoterol  2 puff Inhalation BID   nutrition supplement (JUVEN)  1 packet Oral BID BM   pantoprazole  40 mg Oral Daily   rosuvastatin  10 mg Oral Daily   sevelamer carbonate  0.8 g Oral TID WC   [START ON 06/10/2022] thiamine  100 mg Oral Daily    Dialysis Orders: [facility] [place] 180[facility]RE 350/700 88.5 kg 2.0 K/2.0 Ca UFP 4 No heparin - Mircera 200 mcg  IV q 2wks - last 04/29/22 - Venofer '100mg'$  IV X 5 doses-new order, hasn't been given yet - Hectorol 41m IV TIW  Assessment/Plan: 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 Sharol Given: Previously off antibiotics, resumed empirically with rising WBC. On IV Vanc/Zosyn per primary.  Acute Metabolic Encephalopathy/Intermittent Confusion: Seroquel stopped, minimizing sedatives. CT head negative. Cefepime discontinued. Waxing and waning. Managed by primary. ESRD: Continue HD on usual MWF schedule - HD 8/11 Anemia of ESRD: Hgb 9.0 continue weekly Aranesp (243mg q Fri). Was on IV iron, stopped 8/5 b/c appears to have ongoing infection. Secondary hyperparathyroidism : COrrCa high, VDRA on hold. Continue Renvela as binder. HTN/volume: BP controlled, lowering volume as tolerated. Is well below outpatient dry weight.  Nutrition - Very low albumin, continue protein supplements.  T2DM: Per primary.  Hx CVA: On dual ASA/plavix, and Keppra. Dispo: SNF placement. May have bed. PT to get in chair today.    OLynnda ChildPA-C [facility] 06/02/2022,10:42 AM

## 2022-06-02 NOTE — Progress Notes (Signed)
PROGRESS NOTE    Carolyn Russo  PRF:[id number] DOB: 10-28-1950 DOA: 05/10/2022 PCP: [name], MD  Chief Complaint  Patient presents with   Cellulitis    Brief Narrative:  Carolyn Russo is Carolyn Russo 71 y.o. female with Carolyn Russo history of ESRD on HD, CVA with left hemiplegia, seizures, diabetes, hypertension, hyperlipidemia, chronic systolic heart failure, PAD, GI bleed. Patient presented secondary to worsening right foot infection, found to have gangrene. Empiric antibiotics initiated and patient underwent Daton Szilagyi right BKA. During hospitalization, course has been complicated by altered mental status, in addition to presumed infection of unknown source. Currently resumed on antibiotics.    Assessment & Plan:   Principal Problem:   Leukocytosis Active Problems:   Gangrene of right foot (HCC)   PAD (peripheral artery disease) (HCC)   Encephalopathy   Shortness of breath   Chest pain   History of CVA (cerebrovascular accident)   Anemia in chronic kidney disease   ESRD on dialysis (HCC)   Type 2 diabetes mellitus (HCC)   Obesity (BMI 30-39.9)   Hypertensive disorder   Dyslipidemia (high LDL; low HDL)   CAD (coronary artery disease), native coronary artery   Asthma   Diabetic retinopathy (HCC)   Basal ganglia infarction (HCC)   Foot infection   Below-knee amputation of right lower extremity (HCC)   Seizures (HCC)   Assessment and Plan: * Leukocytosis Afebrile, currently on vanc/zosyn empirically -> will continue for now Unclear cause at this time -> but gradually improving CXR 8/7 without acute findings Blood cx from 8/5 with bacillus species (suspected contaminant) Will repeat blood cx 8/10   Gangrene of right foot (HCC) Empiric Vancomycin and Cefepime initiated on admission. Vascular and orthopedic surgery consulted. Patient underwent right BKA on 7/26 with subsequent discontinuation of antibiotics. PT/OT consulted with recommendations for SNF, however family is hoping for possible  discharge to CIR. Patient with some pain of her leg but manageable. Wound vacuum removed on 8/6 for evaluation for infection (per Dr. Sharol Given on 8/7, no signs of infection).  PAD (peripheral artery disease) (HCC) plavix  Encephalopathy Initial workup without specific/reversible etiology identified. Possibly related to medication effect. Patient with slow but positive recovery of mental status function. On 8/8, patient found to be lethargic with Carolyn Russo blood sugar of 46. D50 given. Persistent stupor. Possible she could have had Carolyn Russo seizure from hypoglycemia with resultant post-ictal phase. Cefepime recently started which could also have contributed, but episode seems to have coincided with hypoglycemic episode. -ABG without hypercarbia -Ammonia 11 on 04/2022 -vitamin b12 wnl, folate wnl -EEG without seizures or epileptiform discharges -CT head without acute process -cefepime d/c'd -high dose thiamine -remains encephalopathic today, I think repeat MRI would be prudent -May need to switch from Cefepime to Zosyn if no improvement  Shortness of breath Currently on RA  History of CVA (cerebrovascular accident) Resultant L hemiplegia, more notable weakness today per discussion with husband? ? Related to episode of hypoglycemia/AMS -> MRI pending  Chest pain Reproducible per previous provider  ESRD on dialysis [facility]) Renal c/s, appreciate assistance  Anemia in chronic kidney disease trend  Type 2 diabetes mellitus (HCC) SSI, hold basal/bolus with episode of hypoglycemia causing confusion     DVT prophylaxis: heparin Code Status: full Family Communication: husband Disposition:   Status is: Inpatient Remains inpatient appropriate because: pending further improvement    Consultants:  renal  Procedures:  Right BKA (7/26)  Antimicrobials:  Anti-infectives (From admission, onward)    Start     Dose/Rate  Route Frequency Ordered Stop   05/31/22 1430  piperacillin-tazobactam (ZOSYN) IVPB  2.25 g        2.25 g 100 mL/hr over 30 Minutes Intravenous Every 8 hours 05/31/22 1354     05/30/22 1200  vancomycin (VANCOREADY) IVPB 750 mg/150 mL        750 mg 150 mL/hr over 60 Minutes Intravenous Every M-W-F (Hemodialysis) 05/28/22 1754     05/28/22 1845  vancomycin (VANCOREADY) IVPB 1250 mg/250 mL        1,250 mg 166.7 mL/hr over 90 Minutes Intravenous  Once 05/28/22 1754 05/28/22 2108   05/28/22 1845  ceFEPIme (MAXIPIME) 1 g in sodium chloride 0.9 % 100 mL IVPB  Status:  Discontinued        1 g 200 mL/hr over 30 Minutes Intravenous Every 24 hours 05/28/22 1754 05/31/22 1241   05/18/22 1300  ceFAZolin (ANCEF) IVPB 2g/100 mL premix  Status:  Discontinued        2 g 200 mL/hr over 30 Minutes Intravenous Every 8 hours 05/18/22 1212 05/18/22 1233   05/14/22 1130  vancomycin (VANCOCIN) IVPB 1000 mg/200 mL premix        1,000 mg 200 mL/hr over 60 Minutes Intravenous  Once 05/14/22 1038 05/14/22 1429   05/11/22 1900  ceFEPIme (MAXIPIME) 1 g in sodium chloride 0.9 % 100 mL IVPB  Status:  Discontinued        1 g 200 mL/hr over 30 Minutes Intravenous Every 24 hours 05/10/22 1715 05/18/22 1633   05/11/22 1200  vancomycin (VANCOCIN) IVPB 1000 mg/200 mL premix  Status:  Discontinued        1,000 mg 200 mL/hr over 60 Minutes Intravenous Every M-W-F (Hemodialysis) 05/10/22 1715 05/19/22 0925   05/10/22 1730  vancomycin (VANCOREADY) IVPB 1750 mg/350 mL        1,750 mg 175 mL/hr over 120 Minutes Intravenous  Once 05/10/22 1715 05/10/22 2151   05/10/22 1730  ceFEPIme (MAXIPIME) 2 g in sodium chloride 0.9 % 100 mL IVPB        2 g 200 mL/hr over 30 Minutes Intravenous  Once 05/10/22 1715 05/10/22 1840       Subjective: Confused, difficult to keep awake  Objective: Vitals:   06/02/22 0422 06/02/22 0813 06/02/22 0936 06/02/22 1508  BP: (!) 134/57  (!) 114/97 113/77  Pulse: 74  82 76  Resp: 20   18  Temp: 98.8 F (37.1 C)  97.9 F (36.6 C) 98.3 F (36.8 C)  TempSrc: Oral  Oral   SpO2:  99% 97%  100%  Weight:      Height:        Intake/Output Summary (Last 24 hours) at 06/02/2022 1855 Last data filed at 06/02/2022 0930 Gross per 24 hour  Intake 220 ml  Output --  Net 220 ml   Filed Weights   05/27/22 1313 06/01/22 1257 06/01/22 1756  Weight: 76.5 kg 79.4 kg 78.2 kg    Examination:  General: No acute distress. Cardiovascular: RRR Lungs: unlabored Abdomen: Soft, nontender, nondistended Neurological: L sided weakness, lethargic today - more than yesterday Extremities: No clubbing or cyanosis. No edema   Data Reviewed: I have personally reviewed following labs and imaging studies  CBC: Recent Labs  Lab 05/29/22 0404 05/30/22 0737 05/31/22 0820 05/31/22 1049 06/01/22 1309 06/02/22 0702  WBC 25.5* 19.1* 22.2*  --  21.8* 16.4*  NEUTROABS 16.8*  --   --   --  15.2* 11.5*  HGB 9.1* 9.5* 9.7* 11.2* 8.7* 9.0*  HCT 27.1* 27.8* 28.2* 33.0* 24.8* 25.6*  MCV 88.6 89.1 89.0  --  88.9 88.3  PLT 318 356 339  --  327 732    Basic Metabolic Panel: Recent Labs  Lab 05/27/22 0235 05/30/22 1510 05/31/22 1049 06/01/22 1309 06/02/22 0702  NA 132* 127* 128* 128* 131*  K 3.9 3.8 3.7 4.4 3.4*  CL 93* 89*  --  92* 92*  CO2 23 23  --  20* 24  GLUCOSE 128* 151*  --  162* 82  BUN 46* 74*  --  81* 26*  CREATININE 6.36* 8.67*  --  9.67* 4.58*  CALCIUM 9.5 9.0  --  8.7* 8.4*  MG  --   --   --   --  1.6*  PHOS  --  5.3*  --   --  3.8    GFR: Estimated Creatinine Clearance: 11.3 mL/min ([place]) (by C-G formula based on SCr of 4.58 mg/dL (H)).  Liver Function Tests: Recent Labs  Lab 05/30/22 1510 06/01/22 1309 06/02/22 0702  AST  --  14* 15  ALT  --  14 15  ALKPHOS  --  55 58  BILITOT  --  0.3 0.9  PROT  --  5.3* 6.1*  ALBUMIN 2.5* 2.2* 2.3*    CBG: Recent Labs  Lab 06/01/22 1843 06/01/22 2115 06/02/22 0847 06/02/22 1145 06/02/22 1613  GLUCAP 78 133* 107* 225* 118*     Recent Results (from the past 240 hour(s))  Culture, blood (Routine X 2) w  Reflex to ID Panel     Status: None   Collection Time: 05/28/22  6:10 PM   Specimen: BLOOD LEFT HAND  Result Value Ref Range Status   Specimen Description BLOOD LEFT HAND  Final   Special Requests   Final    BOTTLES DRAWN AEROBIC AND ANAEROBIC Blood Culture results may not be optimal due to an inadequate volume of blood received in culture bottles   Culture   Final    NO GROWTH 5 DAYS Performed at [facility], [place] [address], [place], [place] [address]    Report Status 06/02/2022 FINAL  Final  Culture, blood (Routine X 2) w Reflex to ID Panel     Status: Abnormal   Collection Time: 05/28/22  6:10 PM   Specimen: BLOOD LEFT HAND  Result Value Ref Range Status   Specimen Description BLOOD LEFT HAND  Final   Special Requests   Final    BOTTLES DRAWN AEROBIC AND ANAEROBIC Blood Culture adequate volume   Culture  Setup Time   Final    GRAM POSITIVE RODS AEROBIC BOTTLE ONLY CRITICAL RESULT CALLED TO, READ BACK BY AND VERIFIED WITH: PHARMD BEN.M AT 0919 ON 05/28/2022 BY T.SAAD.    Culture (Elis Sauber)  Final    BACILLUS SPECIES Standardized susceptibility testing for this organism is not available. Performed at [facility], [place] [address], [place], [place] [address]    Report Status 05/30/2022 FINAL  Final         Radiology Studies: No results found.      Scheduled Meds:  (feeding supplement) PROSource Plus  30 mL Oral BID BM   acetaminophen  1,000 mg Oral Q8H   vitamin C  1,000 mg Oral Daily   carvedilol  3.125 mg Oral BID WC   Chlorhexidine Gluconate Cloth  6 each Topical Q0600   clopidogrel  75 mg Oral Daily   darbepoetin (ARANESP) injection - DIALYSIS  200 mcg Intravenous Q Fri-HD  docusate sodium  100 mg Oral Daily   feeding supplement (NEPRO CARB STEADY)  237 mL Oral BID BM   heparin injection (subcutaneous)  5,000 Units Subcutaneous Q8H   hydrocortisone   Rectal BID   insulin aspart  0-9 Units Subcutaneous TID WC   levETIRAcetam  500 mg Oral QHS    losartan  100 mg Oral Daily   methylphenidate  5 mg Oral BID WC   mometasone-formoterol  2 puff Inhalation BID   nutrition supplement (JUVEN)  1 packet Oral BID BM   pantoprazole  40 mg Oral Daily   rosuvastatin  10 mg Oral Daily   sevelamer carbonate  0.8 g Oral TID WC   [START ON 06/10/2022] thiamine  100 mg Oral Daily   Continuous Infusions:  magnesium sulfate bolus IVPB     piperacillin-tazobactam (ZOSYN)  IV 2.25 g (06/02/22 1751)   thiamine (VITAMIN B1) injection 500 mg (06/02/22 1623)   Followed by   Derrill Memo ON 06/05/2022] thiamine (VITAMIN B1) injection     vancomycin Stopped (06/01/22 1719)     LOS: 23 days    Time spent: over 30 min    Fayrene Helper, MD [facility]   To contact the attending provider between 7A-7P or the covering provider during after hours 7P-7A, please log into the web site [email or web address] and access using universal [place] password for that web site. If you do not have the password, please call the hospital operator.  06/02/2022, 6:55 PM

## 2022-06-02 NOTE — TOC Progression Note (Signed)
Transition of Care [facility]) - Progression Note    Patient Details  Name: Carolyn Russo MRN: [id number] Date of Birth: 11/12/1950  Transition of Care [facility]) CM/SW Contact  Joanne Chars, LCSW Phone Number: 06/02/2022, 10:10 AM  Clinical Narrative:  CSW spoke with pt husband, Dr [name]. He does want to accept offer from [place].  Still prefers CIR, per epic notes, pt has been reviewed by CIR and is not a candidate.  CSW confirmed with [facility] that they can accept pt and accomodate current HD schedule MWF at [place].  Perrin Smack confirms this works for them.      Expected Discharge Plan: [place] (vs CIR vs SNF) Barriers to Discharge: Continued Medical Work up  Expected Discharge Plan and Services Expected Discharge Plan: [place] (vs CIR vs SNF)   Discharge Planning Services: CM Consult                               HH Arranged: RN, PT, Speech Therapy (PTA active with [place]) [place]: [facility] Determinants of Health (SDOH) Interventions    Readmission Risk Interventions     No data to display

## 2022-06-03 ENCOUNTER — Inpatient Hospital Stay (HOSPITAL_COMMUNITY): Payer: Medicare PPO

## 2022-06-03 DIAGNOSIS — D72829 Elevated white blood cell count, unspecified: Secondary | ICD-10-CM | POA: Diagnosis not present

## 2022-06-03 DIAGNOSIS — I639 Cerebral infarction, unspecified: Secondary | ICD-10-CM

## 2022-06-03 LAB — CBC WITH DIFFERENTIAL/PLATELET
Abs Immature Granulocytes: 0 10*3/uL (ref 0.00–0.07)
Basophils Absolute: 0.3 10*3/uL — ABNORMAL HIGH (ref 0.0–0.1)
Basophils Relative: 2 %
Eosinophils Absolute: 0.6 10*3/uL — ABNORMAL HIGH (ref 0.0–0.5)
Eosinophils Relative: 4 %
HCT: 26.5 % — ABNORMAL LOW (ref 36.0–46.0)
Hemoglobin: 9.2 g/dL — ABNORMAL LOW (ref 12.0–15.0)
Lymphocytes Relative: 18 %
Lymphs Abs: 2.9 10*3/uL (ref 0.7–4.0)
MCH: 30.9 pg (ref 26.0–34.0)
MCHC: 34.7 g/dL (ref 30.0–36.0)
MCV: 88.9 fL (ref 80.0–100.0)
Monocytes Absolute: 1.8 10*3/uL — ABNORMAL HIGH (ref 0.1–1.0)
Monocytes Relative: 11 %
Neutro Abs: 10.5 10*3/uL — ABNORMAL HIGH (ref 1.7–7.7)
Neutrophils Relative %: 65 %
Platelets: 329 10*3/uL (ref 150–400)
RBC: 2.98 MIL/uL — ABNORMAL LOW (ref 3.87–5.11)
RDW: 22.6 % — ABNORMAL HIGH (ref 11.5–15.5)
WBC: 16.1 10*3/uL — ABNORMAL HIGH (ref 4.0–10.5)
nRBC: 0.5 % — ABNORMAL HIGH (ref 0.0–0.2)
nRBC: 1 /100 WBC — ABNORMAL HIGH

## 2022-06-03 LAB — COMPREHENSIVE METABOLIC PANEL WITH GFR
ALT: 15 U/L (ref 0–44)
AST: 14 U/L — ABNORMAL LOW (ref 15–41)
Albumin: 2.4 g/dL — ABNORMAL LOW (ref 3.5–5.0)
Alkaline Phosphatase: 52 U/L (ref 38–126)
Anion gap: 13 (ref 5–15)
BUN: 41 mg/dL — ABNORMAL HIGH (ref 8–23)
CO2: 23 mmol/L (ref 22–32)
Calcium: 8.4 mg/dL — ABNORMAL LOW (ref 8.9–10.3)
Chloride: 92 mmol/L — ABNORMAL LOW (ref 98–111)
Creatinine, Ser: 5.79 mg/dL — ABNORMAL HIGH (ref 0.44–1.00)
GFR, Estimated: 7 mL/min — ABNORMAL LOW (ref >60)
Glucose, Bld: 116 mg/dL — ABNORMAL HIGH (ref 70–99)
Potassium: 3.6 mmol/L (ref 3.5–5.1)
Sodium: 128 mmol/L — ABNORMAL LOW (ref 135–145)
Total Bilirubin: 0.8 mg/dL (ref 0.3–1.2)
Total Protein: 5.9 g/dL — ABNORMAL LOW (ref 6.5–8.1)

## 2022-06-03 LAB — MAGNESIUM: Magnesium: 1.6 mg/dL — ABNORMAL LOW (ref 1.7–2.4)

## 2022-06-03 LAB — GLUCOSE, CAPILLARY
Glucose-Capillary: 98 mg/dL (ref 70–99)
Glucose-Capillary: 87 mg/dL (ref 70–99)
Glucose-Capillary: 141 mg/dL — ABNORMAL HIGH (ref 70–99)
Glucose-Capillary: 136 mg/dL — ABNORMAL HIGH (ref 70–99)

## 2022-06-03 LAB — PHOSPHORUS: Phosphorus: 4.8 mg/dL — ABNORMAL HIGH (ref 2.5–4.6)

## 2022-06-03 LAB — HEMOGLOBIN A1C
Hgb A1c MFr Bld: 4.7 % — ABNORMAL LOW (ref 4.8–5.6)
Mean Plasma Glucose: 88.19 mg/dL

## 2022-06-03 MED ORDER — ASPIRIN 81 MG PO TBEC
81.0000 mg | DELAYED_RELEASE_TABLET | Freq: Every day | ORAL | Status: DC
  Administered 2022-06-03 – 2022-06-10 (×8): 81 mg via ORAL
  Filled 2022-06-03 (×8): qty 1

## 2022-06-03 MED ORDER — CAPSAICIN 0.025 % EX CREA
TOPICAL_CREAM | Freq: Two times a day (BID) | CUTANEOUS | Status: DC
  Administered 2022-06-06: 1 via TOPICAL
  Filled 2022-06-03 (×2): qty 60

## 2022-06-03 MED ORDER — HEPARIN SODIUM (PORCINE) 1000 UNIT/ML DIALYSIS: 1000.0000 [IU] | INTRAMUSCULAR | Status: DC | PRN

## 2022-06-03 MED ORDER — ANTICOAGULANT SODIUM CITRATE 4% (200MG/5ML) IV SOLN: 5.0000 mL | Status: DC | PRN

## 2022-06-03 MED ORDER — ALTEPLASE 2 MG IJ SOLR
2.0000 mg | Freq: Once | INTRAMUSCULAR | Status: AC | PRN
  Administered 2022-06-03: 2 mg
  Filled 2022-06-03: qty 2

## 2022-06-03 MED ORDER — HEPARIN SODIUM (PORCINE) 1000 UNIT/ML DIALYSIS
20.0000 [IU]/kg | INTRAMUSCULAR | Status: DC | PRN
Start: 2022-06-03 — End: 2022-06-03
  Administered 2022-06-03: 1600 [IU] via INTRAVENOUS_CENTRAL
  Filled 2022-06-03: qty 2

## 2022-06-03 MED ORDER — ALTEPLASE 2 MG IJ SOLR
INTRAMUSCULAR | Status: AC
  Filled 2022-06-03: qty 2

## 2022-06-03 NOTE — Progress Notes (Signed)
Received patient in bed to unit.  Alert and oriented.  Informed consent signed and in  chart.   Treatment initiated: 0727 Treatment completed: 1130  Patient tolerated well.  Transported back to the room  alert, without acute distress.  Hand-off given to patient's nurse.   Access used: HD cath Access issues: yes, issues requiring lines to be reversed and TPA admin at end of tx  Total UF removed: 1550 ml Medication(s) given: Heparin 1,600 units bolus at beginning of tx, Aranesp 293mg IVP, Vancomycin '750mg'$  IVPB, TPA 3.2 mls  Post HD VS: 97.3AX    114/74    71    15    100% 2L [place] Post HD weight: 78.9kg   [facility]

## 2022-06-03 NOTE — Progress Notes (Signed)
[facility] Progress Note   Subjective:   Seen on HD, tolerating well. Requested ice to eat, advised we cannot give her ice chips during HD. Denies CP, SOB, abdominal pain and nausea. Still seems a bit confused but alert.   Objective Vitals:   06/03/22 0729 06/03/22 0730 06/03/22 0800 06/03/22 0830  BP:  (!) 119/53 98/81 115/68  Pulse:  72 70 71  Resp:  12 (!) 27 12  Temp:      TempSrc:      SpO2:  100% 98% 100%  Weight: 80.9 kg     Height:       Physical Exam General: Alert, elderly female in NAD Heart: RRR, no murmurs, rubs or gallops Lungs: CTA bilaterally without wheezing, normal WOB, O2 sat 100% Abdomen: Soft, non-distended, +BS Extremities: No edema b/l lower extremities Dialysis Access:  [facility] accessed  Additional Objective Labs: Basic Metabolic Panel: Recent Labs  Lab 05/30/22 1510 05/31/22 1049 06/01/22 1309 06/02/22 0702 06/03/22 0138  NA 127*   < > 128* 131* 128*  K 3.8   < > 4.4 3.4* 3.6  CL 89*  --  92* 92* 92*  CO2 23  --  20* 24 23  GLUCOSE 151*  --  162* 82 116*  BUN 74*  --  81* 26* 41*  CREATININE 8.67*  --  9.67* 4.58* 5.79*  CALCIUM 9.0  --  8.7* 8.4* 8.4*  PHOS 5.3*  --   --  3.8 4.8*   < > = values in this interval not displayed.   Liver Function Tests: Recent Labs  Lab 06/01/22 1309 06/02/22 0702 06/03/22 0138  AST 14* 15 14*  ALT '14 15 15  '$ ALKPHOS 55 58 52  BILITOT 0.3 0.9 0.8  PROT 5.3* 6.1* 5.9*  ALBUMIN 2.2* 2.3* 2.4*   No results for input(s): "LIPASE", "AMYLASE" in the last 168 hours. CBC: Recent Labs  Lab 05/30/22 0737 05/31/22 0820 05/31/22 1049 06/01/22 1309 06/02/22 0702 06/03/22 0138  WBC 19.1* 22.2*  --  21.8* 16.4* 16.1*  NEUTROABS  --   --   --  15.2* 11.5* 10.5*  HGB 9.5* 9.7*   < > 8.7* 9.0* 9.2*  HCT 27.8* 28.2*   < > 24.8* 25.6* 26.5*  MCV 89.1 89.0  --  88.9 88.3 88.9  PLT 356 339  --  327 339 329   < > = values in this interval not displayed.   Blood Culture    Component Value  Date/Time   SDES BLOOD LEFT HAND 06/02/2022 0708   SPECREQUEST  06/02/2022 0708    BOTTLES DRAWN AEROBIC AND ANAEROBIC Blood Culture results may not be optimal due to an inadequate volume of blood received in culture bottles   CULT  06/02/2022 0708    NO GROWTH < 24 HOURS Performed at [facility], [place] [address], [place], [place] [address]    REPTSTATUS PENDING 06/02/2022 0708    Cardiac Enzymes: No results for input(s): "CKTOTAL", "CKMB", "CKMBINDEX", "TROPONINI" in the last 168 hours. CBG: Recent Labs  Lab 06/01/22 2115 06/02/22 0847 06/02/22 1145 06/02/22 1613 06/02/22 2233  GLUCAP 133* 107* 225* 118* 128*   Iron Studies: No results for input(s): "IRON", "TIBC", "TRANSFERRIN", "FERRITIN" in the last 72 hours. '@lablastinr3'$ @ Studies/Results: MR BRAIN WO CONTRAST  Result Date: 06/03/2022 CLINICAL DATA:  Confusion.  History of end-stage renal disease EXAM: MRI HEAD WITHOUT CONTRAST TECHNIQUE: Multiplanar, multiecho pulse sequences of the brain and surrounding structures were obtained without intravenous  contrast. COMPARISON:  Head CT from 3 days ago. FINDINGS: Brain: Punctate focus of acute ischemia in the left corona radiata. Small acute cortical infarct in the right parietal lobe. Remote perforator infarct at the right corona radiata with wallerian degeneration seen descending the brainstem. Confluent ischemic gliosis in the cerebral white matter. No acute hemorrhage, hydrocephalus, or collection. Vascular: Preserved flow voids Skull and upper cervical spine: No focal marrow lesion Sinuses/Orbits: Bilateral cataract resection.  No acute finding Other: Intermittently pronounced motion artifact IMPRESSION: 1. 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 2. Advanced chronic small vessel ischemia. 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 3. Motion degraded. Electronically Signed   By: Jorje Guild M.D.   On: 06/03/2022 03:58   Medications:  anticoagulant sodium citrate     magnesium sulfate bolus IVPB     piperacillin-tazobactam (ZOSYN)  IV 2.25 g (06/03/22 0554)   thiamine (VITAMIN B1) injection 500 mg (06/03/22 0507)   Followed by   Derrill Memo ON 06/05/2022] thiamine (VITAMIN B1) injection     vancomycin Stopped (06/01/22 1719)    (feeding supplement) PROSource Plus  30 mL Oral BID BM   acetaminophen  1,000 mg Oral Q8H   vitamin C  1,000 mg Oral Daily   carvedilol  3.125 mg Oral BID WC   Chlorhexidine Gluconate Cloth  6 each Topical Q0600   clopidogrel  75 mg Oral Daily   darbepoetin (ARANESP) injection - DIALYSIS  200 mcg Intravenous Q Fri-HD   docusate sodium  100 mg Oral Daily   feeding supplement (NEPRO CARB STEADY)  237 mL Oral BID BM   heparin injection (subcutaneous)  5,000 Units Subcutaneous Q8H   hydrocortisone   Rectal BID   insulin aspart  0-9 Units Subcutaneous TID WC   levETIRAcetam  500 mg Oral QHS   losartan  100 mg Oral Daily   methylphenidate  5 mg Oral BID WC   mometasone-formoterol  2 puff Inhalation BID   nutrition supplement (JUVEN)  1 packet Oral BID BM   pantoprazole  40 mg Oral Daily   rosuvastatin  10 mg Oral Daily   sevelamer carbonate  0.8 g Oral TID WC   [START ON 06/10/2022] thiamine  100 mg Oral Daily    Dialysis Orders: MWF - [place] 4 hrs 180NRE 350/700 88.5 kg 2.0 K/2.0 Ca UFP 4 No heparin - Mircera 200 mcg  IV q 2wks - last 04/29/22 - Venofer '100mg'$  IV X 5 doses-new order, hasn't been given yet - Hectorol 58mg IV TIW  Assessment/Plan: 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 DSharol Given Previously off antibiotics, resumed empirically with rising WBC. On IV Vanc/Zosyn per primary.  2. Acute Metabolic Encephalopathy/Intermittent Confusion: Seroquel stopped, minimizing sedatives. CT head negative. Cefepime discontinued. Waxing and waning. Managed by primary. 3. ESRD: Continue HD on usual MWF schedule, tolerating well 4.  Anemia of ESRD: Hgb 9.2-improved. continue weekly Aranesp (2073m q Fri). Was on IV iron, stopped 8/5 because of ongoing infection. Secondary hyperparathyroidism : COrrCa high, VDRA on hold. Phos at goal. Continue Renvela as binder. HTN/volume: BP controlled, lowering volume as tolerated. Is well below outpatient dry weight.  7. Nutrition - Very low albumin, continue protein supplements.  8. T2DM: Per primary.  9. Hx CVA: On dual ASA/plavix, and Keppra. 10. Dispo: SNF placement.     SaAnice PaganiniPA-C 06/03/2022, 8:46 AM  [facility] Pager: [phone]

## 2022-06-03 NOTE — Evaluation (Signed)
Clinical/Bedside Swallow Evaluation Patient Details  Name: Carolyn Russo MRN: [id number] Date of Birth: 16-Aug-1951  Today's Date: 06/03/2022 Time: SLP Start Time (ACUTE ONLY): 68 SLP Stop Time (ACUTE ONLY): 2248 SLP Time Calculation (min) (ACUTE ONLY): 21 min  Past Medical History:  Past Medical History:  Diagnosis Date   Acute GI bleeding    Allergy    Anemia    Anterior chest wall pain    Appendicitis 1965   Asthma    Body mass index 37.0-37.9, adult    Breast pain    Cataract    both eyes   CHF (congestive heart failure) ([place])    Chronic kidney disease    stage 5 - on dialysis   Cognitive change 04/20/2021   r/t cva 11/5001   Complication of anesthesia    Memory loss after general   Dehydration 2014   Deviated septum 1971   Diabetes mellitus    Dysphagia due to old stroke    easy to get strangled when eating   Dyspnea 2014   Extrinsic asthma    WITH ASTHMA ATTACK   Fibroid 1980   GERD (gastroesophageal reflux disease)    Heart murmur    History of migraine    History of seizure    with stroke   Hx gestational diabetes    Hyperlipidemia    Hypertension 2014   Inguinal hernia 1959   Malaise and fatigue 2014   Non-IgE mediated allergic asthma 2014   Obesity    Pelvic pain    Pregnancy, high-risk 1985   Stroke ([place]) 04/20/2021   (CVA) of right basal ganglia   Tonsillitis 1968   Uterine fibroid 1980   Visual field defect    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   Past Surgical History:  Past Surgical History:  Procedure Laterality Date   ABDOMINAL AORTOGRAM W/LOWER EXTREMITY N/A 02/21/2022   Procedure: ABDOMINAL AORTOGRAM W/LOWER EXTREMITY;  Surgeon: Waynetta Sandy, MD;  Location: [place] [facility];  Service: Cardiovascular;  Laterality: N/A;   AMPUTATION Right 05/18/2022   Procedure: RIGHT BELOW KNEE AMPUTATION;  Surgeon: Newt Minion, MD;  Location: [place];  Service: Orthopedics;  Laterality: Right;   AMPUTATION TOE Right 04/15/2022   Procedure: AMPUTATION   LST TOERIGHT FOOT;  Surgeon: Criselda Peaches, DPM;  Location: [facility];  Service: Podiatry;  Laterality: Right;   APPENDECTOMY  7048   BASCILIC VEIN TRANSPOSITION Right 04/30/2021   Procedure: RIGHT FIRST STAGE [name];  Surgeon: Angelia Mould, MD;  Location: [place];  Service: Vascular;  Laterality: Right;   CESAREAN SECTION  1985   COLONOSCOPY     ESOPHAGOGASTRODUODENOSCOPY (EGD) WITH PROPOFOL N/A 04/18/2021   Procedure: ESOPHAGOGASTRODUODENOSCOPY (EGD) WITH PROPOFOL;  Surgeon: Gatha Mayer, MD;  Location: [place];  Service: Endoscopy;  Laterality: N/A;   EYE SURGERY     bilateral cataract    FLEXIBLE SIGMOIDOSCOPY N/A 04/18/2021   Procedure: FLEXIBLE SIGMOIDOSCOPY;  Surgeon: Gatha Mayer, MD;  Location: [facility];  Service: Endoscopy;  Laterality: N/A;   HERNIA REPAIR  1959   IR FLUORO GUIDE CV LINE RIGHT  03/18/2021   IR US GUIDE VASC ACCESS RIGHT  03/18/2021   LEFT HEART CATH AND CORONARY ANGIOGRAPHY N/A 04/04/2017   Procedure: Left Heart Cath and Coronary Angiography;  Surgeon: Belva Crome, MD;  Location: [place] [facility];  Service: Cardiovascular;  Laterality: N/A;   [place], 2004, 2007   PERIPHERAL VASCULAR THROMBECTOMY  02/21/2022  Procedure: PERIPHERAL VASCULAR THROMBECTOMY;  Surgeon: Waynetta Sandy, MD;  Location: [place] [facility];  Service: Cardiovascular;;  Right Tibial   RHINOPLASTY  1971   ROTATOR CUFF REPAIR  2003   SURGICAL REPAIR OF HEMORRHAGE  2015   TONSILLECTOMY  1968   HPI:  71 y/o female presented to ED on 05/10/22 for R foot infection after 1st ray amputation x 1 month ago. S/p R BKA on 7/26. PMH: diabetes, hypertension, hyperlipidemia, ESRD on HD MWF, hx of CVA 03/2021. Pt became encephlopathic and MRI revealed small acute infarcts in left corona radiata and right parietnal cortex. Remote right internal capsule infarct. BSE 04/2021 recommended Dys 3/thin liquids    Assessment / Plan / Recommendation   Clinical Impression  Pt seen for swallow assessment with husband at bedside. She was evaluated 04/2021 with recommendation for Dys 3, thin. Husband states she has difficulty masticating meats and prefers to eat soft foods (even when he cuts up her meat). Mild CN VII impairments with decreased symmetry and ROM on left, limited lingual ROM but symmetrical. Volitional cough is more of a throat clear. She masticated solid graham cracker texture without difficulty and no s/s aspiration with thin or solids. Recommend she remain on regular, thin with husband ordering foods she can/will eat that are softer (which he has been doing) versus limiting foods with modified diet. Crush pills, eat when adequately alert. No further ST needed. SLP Visit Diagnosis: Dysphagia, unspecified (R13.10)    Aspiration Risk  Mild aspiration risk    Diet Recommendation Regular;Thin liquid   Liquid Administration via: Cup;Straw Medication Administration: Crushed with puree Supervision: Staff to assist with self feeding;Full supervision/cueing for compensatory strategies Compensations: Minimize environmental distractions;Slow rate;Small sips/bites Postural Changes: Seated upright at 90 degrees    Other  Recommendations Oral Care Recommendations: Oral care BID    Recommendations for follow up therapy are one component of a multi-disciplinary discharge planning process, led by the attending physician.  Recommendations may be updated based on patient status, additional functional criteria and insurance authorization.  Follow up Recommendations No SLP follow up      Assistance Recommended at Discharge None  Functional Status Assessment    Frequency and Duration            Prognosis        Swallow Study   General Date of Onset: 05/10/22 HPI: 71 y/o female presented to ED on 05/10/22 for R foot infection after 1st ray amputation x 1 month ago. S/p R BKA on 7/26. PMH: diabetes, hypertension, hyperlipidemia, ESRD on HD  MWF, hx of CVA 03/2021. Pt became encephlopathic and MRI revealed small acute infarcts in left corona radiata and right parietnal cortex. Remote right internal capsule infarct. BSE 04/2021 recommended Dys 3/thin liquids Type of Study: Bedside Swallow Evaluation Previous Swallow Assessment: see HPI Diet Prior to this Study: Regular;Thin liquids Temperature Spikes Noted: No Respiratory Status: Nasal cannula History of Recent Intubation: No Behavior/Cognition: Cooperative;Pleasant mood (somewhat drowsy) Oral Cavity Assessment:  (unable to fully view) Oral Care Completed by SLP: No Oral Cavity - Dentition: Adequate natural dentition Vision: Functional for self-feeding Self-Feeding Abilities: Needs assist Patient Positioning: Upright in bed Baseline Vocal Quality: Normal Volitional Cough: Weak    Oral/Motor/Sensory Function Overall Oral Motor/Sensory Function: Mild impairment Facial ROM: Reduced left;Suspected CN VII (facial) dysfunction Facial Symmetry: Abnormal symmetry left;Suspected CN VII (facial) dysfunction Facial Strength: Reduced left;Suspected CN VII (facial) dysfunction Lingual ROM: Reduced right;Reduced left Lingual Symmetry: Within Functional Limits Mandible: Within Functional  Limits   Ice Chips Ice chips: Not tested   Thin Liquid Thin Liquid: Within functional limits Presentation: Straw    Nectar Thick Nectar Thick Liquid: Not tested   Honey Thick Honey Thick Liquid: Not tested   Puree Puree: Not tested   Solid     Solid: Within functional limits      Houston Siren 06/03/2022,3:19 PM

## 2022-06-03 NOTE — Plan of Care (Signed)
  Problem: Activity: Goal: Risk for activity intolerance will decrease Outcome: Not Progressing   Problem: Elimination: Goal: Will not experience complications related to bowel motility Outcome: Progressing Goal: Will not experience complications related to urinary retention Outcome: Not Applicable   Problem: Pain Managment: Goal: General experience of comfort will improve Outcome: Progressing

## 2022-06-03 NOTE — Progress Notes (Addendum)
Neurology Progress Note  Brief HPI: Carolyn Russo is a 71 y.o. female with a past medical history of hypertension, HLD, type 2 DM, CAD, 2 previous CVAs (2017 and 2022), PAD s/p recent stent and on Plavix, ESRD on HD MWF, and known anesthesia complication who initially presented with dry gangrene of the right foot who is now s/p right BKA. BKA was completed on 7/26. Neurology was re-consulted due to acute stroke noted on MRI and altered mental status. She was initially noted to be lethargic and hypoglycemic on 8/8. It is also noted that she was on cefepime until 7/27. EEG was negative for seizure or epileptiform discharges, but previous EEG after stroke showed potential etiopathogenicity arising from the right central parietal region and she was started on Keppra. MRI on 8/11 showed acute punctate infarcts.  Subjective: Seen in hemodialysis unit. She is drowsy, but awakens to verbal stimuli. She is stating that she feels like she can't breathe. She is on a nasal cannula and SpO2 is 100% on the monitor. She is redirectable and able to have a conversation with me, but she does require frequent stimulation to awaken and focus. She is able to move her extremities antigravity.   Past Medical History:  Diagnosis Date   Acute GI bleeding    Allergy    Anemia    Anterior chest wall pain    Appendicitis 1965   Asthma    Body mass index 37.0-37.9, adult    Breast pain    Cataract    both eyes   CHF (congestive heart failure) ([place])    Chronic kidney disease    stage 5 - on dialysis   Cognitive change 04/20/2021   r/t cva 06/4173   Complication of anesthesia    Memory loss after general   Dehydration 2014   Deviated septum 1971   Diabetes mellitus    Dysphagia due to old stroke    easy to get strangled when eating   Dyspnea 2014   Extrinsic asthma    WITH ASTHMA ATTACK   Fibroid 1980   GERD (gastroesophageal reflux disease)    Heart murmur    History of migraine    History of seizure    with  stroke   Hx gestational diabetes    Hyperlipidemia    Hypertension 2014   Inguinal hernia 1959   Malaise and fatigue 2014   Non-IgE mediated allergic asthma 2014   Obesity    Pelvic pain    Pregnancy, high-risk 1985   Stroke ([place]) 04/20/2021   (CVA) of right basal ganglia   Tonsillitis 1968   Uterine fibroid 1980   Visual field defect    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   Social History:   reports that she has never smoked. She has never used smokeless tobacco. She reports that she does not drink alcohol and does not use drugs.  Medications  Current Facility-Administered Medications:    (feeding supplement) PROSource Plus liquid 30 mL, 30 mL, Oral, BID BM, Newt Minion, MD, 30 mL at 06/02/22 1605   acetaminophen (TYLENOL) tablet 1,000 mg, 1,000 mg, Oral, Q8H, Newt Minion, MD, 1,000 mg at 06/03/22 0509   acetaminophen (TYLENOL) tablet 325-650 mg, 325-650 mg, Oral, Q6H PRN, Newt Minion, MD, 650 mg at 05/30/22 0053   albuterol (PROVENTIL) (2.5 MG/3ML) 0.083% nebulizer solution 2.5 mg, 2.5 mg, Nebulization, Q2H PRN, Newt Minion, MD   alteplase (CATHFLO ACTIVASE) injection 2 mg, 2 mg, Intracatheter, Once PRN, Vanessa Mabton  Shirlee Limerick, PA-C   anticoagulant sodium citrate solution 5 mL, 5 mL, Intracatheter, PRN, Ejigiri, Ogechi Grace, PA-C   ascorbic acid (VITAMIN C) tablet 1,000 mg, 1,000 mg, Oral, Daily, Newt Minion, MD, 1,000 mg at 06/02/22 0936   bisacodyl (DULCOLAX) EC tablet 5 mg, 5 mg, Oral, Daily PRN, Newt Minion, MD, 5 mg at 05/24/22 1648   carvedilol (COREG) tablet 3.125 mg, 3.125 mg, Oral, BID WC, Newt Minion, MD, 3.125 mg at 06/02/22 1911   Chlorhexidine Gluconate Cloth 2 % PADS 6 each, 6 each, Topical, Q0600, Penninger, Lindsay, PA, 6 each at 06/03/22 0430   clopidogrel (PLAVIX) tablet 75 mg, 75 mg, Oral, Daily, Rai, Ripudeep K, MD, 75 mg at 06/02/22 5726   Darbepoetin Alfa (ARANESP) injection 200 mcg, 200 mcg, Intravenous, Q Fri-HD, Newt Minion, MD, 200 mcg at  06/03/22 2035   docusate sodium (COLACE) capsule 100 mg, 100 mg, Oral, Daily, Newt Minion, MD, 100 mg at 06/02/22 5974   feeding supplement (NEPRO CARB STEADY) liquid 237 mL, 237 mL, Oral, BID BM, Newt Minion, MD, 237 mL at 06/02/22 2030   fluticasone (FLONASE) 50 MCG/ACT nasal spray 2 spray, 2 spray, Each Nare, Daily PRN, Newt Minion, MD   guaiFENesin-dextromethorphan (ROBITUSSIN DM) 100-10 MG/5ML syrup 15 mL, 15 mL, Oral, Q4H PRN, Newt Minion, MD   heparin injection 1,000 Units, 1,000 Units, Intracatheter, PRN, Phillis Knack, Thomos Lemons, PA-C   heparin injection 1,600 Units, 20 Units/kg, Dialysis, PRN, Lynnda Child, PA-C, 1,600 Units at 06/03/22 1638   heparin injection 5,000 Units, 5,000 Units, Subcutaneous, Q8H, Newt Minion, MD, 5,000 Units at 06/02/22 2237   hydrALAZINE (APRESOLINE) injection 5 mg, 5 mg, Intravenous, Q20 Min PRN, Newt Minion, MD   hydrALAZINE (APRESOLINE) tablet 10 mg, 10 mg, Oral, Q8H PRN, Newt Minion, MD   hydrocortisone (ANUSOL-HC) 2.5 % rectal cream, , Rectal, BID, Mariel Aloe, MD, Given at 06/02/22 2040   insulin aspart (novoLOG) injection 0-9 Units, 0-9 Units, Subcutaneous, TID WC, Rai, Ripudeep K, MD, 3 Units at 06/02/22 1304   labetalol (NORMODYNE) injection 10 mg, 10 mg, Intravenous, Q10 min PRN, Newt Minion, MD   levETIRAcetam (KEPPRA) tablet 500 mg, 500 mg, Oral, QHS, Newt Minion, MD, 500 mg at 06/02/22 2241   losartan (COZAAR) tablet 100 mg, 100 mg, Oral, Daily, Penninger, Lindsay, PA, 100 mg at 06/02/22 0934   magnesium sulfate IVPB 2 g 50 mL, 2 g, Intravenous, Daily PRN, Newt Minion, MD   melatonin tablet 3 mg, 3 mg, Oral, QHS PRN, Mariel Aloe, MD, 3 mg at 06/01/22 0241   methylphenidate (RITALIN) tablet 5 mg, 5 mg, Oral, BID WC, Mariel Aloe, MD, 5 mg at 06/02/22 1609   metoprolol tartrate (LOPRESSOR) injection 2-5 mg, 2-5 mg, Intravenous, Q2H PRN, Newt Minion, MD   mometasone-formoterol [facility]) 200-5 MCG/ACT  inhaler 2 puff, 2 puff, Inhalation, BID, Newt Minion, MD, 2 puff at 06/02/22 2035   morphine (PF) 2 MG/ML injection 0.5-1 mg, 0.5-1 mg, Intravenous, Q4H PRN, Rai, Ripudeep K, MD   nutrition supplement (JUVEN) (JUVEN) powder packet 1 packet, 1 packet, Oral, BID BM, Newt Minion, MD, 1 packet at 06/02/22 1605   ondansetron [facility]) injection 4 mg, 4 mg, Intravenous, Q6H PRN, Newt Minion, MD, 4 mg at 05/27/22 1526   ondansetron (ZOFRAN) tablet 8 mg, 8 mg, Oral, BID PRN, Newt Minion, MD, 8 mg at 05/14/22 1754   oxyCODONE (  Oxy IR/ROXICODONE) immediate release tablet 2.5 mg, 2.5 mg, Oral, Q4H PRN, Mariel Aloe, MD   pantoprazole (PROTONIX) EC tablet 40 mg, 40 mg, Oral, Daily, Newt Minion, MD, 40 mg at 06/02/22 0936   phenol (CHLORASEPTIC) mouth spray 1 spray, 1 spray, Mouth/Throat, PRN, Newt Minion, MD   piperacillin-tazobactam (ZOSYN) IVPB 2.25 g, 2.25 g, Intravenous, Q8H, Mariel Aloe, MD, Last Rate: 100 mL/hr at 06/03/22 0554, 2.25 g at 06/03/22 0554   polyethylene glycol (MIRALAX / GLYCOLAX) packet 17 g, 17 g, Oral, Daily PRN, Newt Minion, MD   potassium chloride SA (KLOR-CON M) CR tablet 20-40 mEq, 20-40 mEq, Oral, Daily PRN, Newt Minion, MD   rosuvastatin (CRESTOR) tablet 10 mg, 10 mg, Oral, Daily, Reome, Earle J, RPH, 10 mg at 06/02/22 0935   senna-docusate (Senokot-S) tablet 1-2 tablet, 1-2 tablet, Oral, Daily PRN, Newt Minion, MD, 2 tablet at 05/24/22 1648   sevelamer carbonate (RENVELA) packet 0.8 g, 0.8 g, Oral, TID WC, Newt Minion, MD, 0.8 g at 06/02/22 2028   thiamine (VITAMIN B1) 500 mg in normal saline (50 mL) IVPB, 500 mg, Intravenous, Q8H, Last Rate: 100 mL/hr at 06/03/22 0507, 500 mg at 06/03/22 0507 **FOLLOWED BY** [START ON 06/05/2022] thiamine (VITAMIN B1) 250 mg in sodium chloride 0.9 % 50 mL IVPB, 250 mg, Intravenous, Daily **FOLLOWED BY** [START ON 06/10/2022] thiamine (VITAMIN B1) tablet 100 mg, 100 mg, Oral, Daily, Elodia Florence., MD    vancomycin (VANCOREADY) IVPB 750 mg/150 mL, 750 mg, Intravenous, Q M,W,F-HD, Donnamae Jude, [facility], Last Rate: 150 mL/hr at 06/03/22 1031, 750 mg at 06/03/22 1031   witch hazel-glycerin (TUCKS) pad, , Topical, PRN, Rai, Ripudeep K, MD  Exam: Current vital signs: BP (!) 114/93 (BP Location: Left Arm)   Pulse 65   Temp 98.6 F (37 C) (Axillary)   Resp 13   Ht '5\' 5"'$  (1.651 m)   Wt 80.9 kg   SpO2 100%   BMI 29.68 kg/m  Vital signs in last 24 hours: Temp:  [98.3 F (36.8 C)-98.6 F (37 C)] 98.6 F (37 C) (08/11 0717) Pulse Rate:  [65-76] 65 (08/11 1000) Resp:  [12-27] 13 (08/11 1000) BP: (98-146)/(42-97) 114/93 (08/11 1000) SpO2:  [98 %-100 %] 100 % (08/11 1000) Weight:  [80.9 kg] 80.9 kg (08/11 0729)  GENERAL: Awake, alert in NAD HEENT: - Normocephalic and atraumatic, dry mm, no LN++, no Thyromegally LUNGS - Clear to auscultation bilaterally with no wheezes CV - S1S2 RRR, no m/r/g, equal pulses bilaterally. ABDOMEN - Soft, nontender, nondistended with normoactive BS Ext: warm, well perfused, intact peripheral pulses,  R BKA dressing is CDI  NEURO:  Mental Status: Drowsy, awakens to verbal stimuli. AA&Ox3 and able to converse when asked questions. She is slow to respond and will perseverate on thoughts and answers to questions but she is redirectable and answers questions appropriately.  Language: speech is clear.  Naming, repetition, fluency, and comprehension intact. Cranial Nerves: PERRL, EOMI, visual fields full, no facial asymmetry, facial sensation intact, hearing intact, tongue/uvula/soft palate midline, normal sternocleidomastoid and trapezius muscle strength. No evidence of tongue atrophy or fasciculations Motor: Generalized weakness in bilateral upper extremities at a 3/5. Arms with drift back to the bed. Hand grasp is slightly weaker on the left than the right. Able to move LLE antigravity.  Tone: is normal and bulk is normal Sensation- Intact to light touch  bilaterally Coordination: unable to complete Gait- deferred   Labs I have  reviewed labs in epic and the results pertinent to this consultation are:  CBC    Component Value Date/Time   WBC 16.1 (H) 06/03/2022 0138   RBC 2.98 (L) 06/03/2022 0138   HGB 9.2 (L) 06/03/2022 0138   HGB 9.3 (L) 04/13/2022 1512   HCT 26.5 (L) 06/03/2022 0138   HCT 27.6 (L) 04/13/2022 1512   PLT 329 06/03/2022 0138   PLT 341 04/13/2022 1512   MCV 88.9 06/03/2022 0138   MCV 89 04/13/2022 1512   MCH 30.9 06/03/2022 0138   MCHC 34.7 06/03/2022 0138   RDW 22.6 (H) 06/03/2022 0138   RDW 16.1 (H) 04/13/2022 1512   LYMPHSABS 2.9 06/03/2022 0138   LYMPHSABS 2.4 04/13/2022 1512   MONOABS 1.8 (H) 06/03/2022 0138   EOSABS 0.6 (H) 06/03/2022 0138   EOSABS 0.4 04/13/2022 1512   BASOSABS 0.3 (H) 06/03/2022 0138   BASOSABS 0.1 04/13/2022 1512    CMP     Component Value Date/Time   NA 128 (L) 06/03/2022 0138   NA 142 04/13/2022 1512   K 3.6 06/03/2022 0138   CL 92 (L) 06/03/2022 0138   CO2 23 06/03/2022 0138   GLUCOSE 116 (H) 06/03/2022 0138   BUN 41 (H) 06/03/2022 0138   BUN 12 04/13/2022 1512   CREATININE 5.79 (H) 06/03/2022 0138   CREATININE 0.84 10/13/2011 0903   CALCIUM 8.4 (L) 06/03/2022 0138   PROT 5.9 (L) 06/03/2022 0138   PROT 7.1 04/13/2022 1512   ALBUMIN 2.4 (L) 06/03/2022 0138   ALBUMIN 3.9 04/13/2022 1512   AST 14 (L) 06/03/2022 0138   ALT 15 06/03/2022 0138   ALKPHOS 52 06/03/2022 0138   BILITOT 0.8 06/03/2022 0138   BILITOT 0.3 04/13/2022 1512   GFRNONAA 7 (L) 06/03/2022 0138   GFRNONAA 75 10/13/2011 0903   GFRAA 17 06/19/2020 0000   GFRAA 86 10/13/2011 0903    Lipid Panel     Component Value Date/Time   CHOL 96 (L) 04/13/2022 1526   TRIG 130 04/13/2022 1526   HDL 34 (L) 04/13/2022 1526   CHOLHDL 2.8 04/13/2022 1526   CHOLHDL 2.9 04/16/2021 1840   VLDL 18 04/16/2021 1840   LDLCALC 39 04/13/2022 1526     Imaging I have reviewed the images obtained:  CT-head- advanced  chronic microvascular ischemic white matter disease.  MRI brain- Small acute infarcts in the left corona radiata and right parietal cortex.   Assessment: Carolyn Russo is a 71 y.o. AA female with extensive and complicated PMH as above and neurologic history significant for 2 CVAs involving right subcortical and right basal ganglia as above and seizure 2022 with recent admission 6/23 with dry gangrene of right great toe and underwent elective surgery per Dr. Sherryle Lis of podiatry. Patient then developed worsening progression of necrosis to the right forefoot for which amputation was recommended. Underwent right below-knee amputation on 7/26 with Dr. Sharol Given. She was noted to be lethargic and hypoglycemic on 8/8. It is also noted that she was on cefepime until 7/27 which was thought possibly to be related to her AMS (cefepime-related neurotoxicity). EEG was negative for seizure or epileptiform discharges, but previous EEG after stroke showed potential etiopathogenicity arising from the right central parietal region and she had been started on Keppra previously. MRI obtained on 8/11 to assess a possible structural etiology for her AMS showed two acute punctate infarcts.  - Exam reveals bradyphrenia with slowed speech, increased latencies of verbal and motor responses, as well as some perseveration -  Acute strokes on MRI are punctate and do not explain her encephalopathy. Stroke work up will need to be completed to assess for underlying etiology.  - AMS DDx: - Prominent chronic small vessel ischemic changes involving the white matter of the cerebral hemispheres could be associated with decreased neurological reserve and increased susceptibility to relatively minor neurological insults.  - It is documented that the patient has had memory loss with anesthesia in the past.  - She does also have end stage renal failure and it was noted that she had lethargy in the ED due to receiving fentanyl.    Recommendations: - HgbA1c - MRA of the brain without contrast - Frequent neuro checks - Echocardiogram - Carotid dopplers - Risk factor modification - Telemetry monitoring - PT consult, OT consult, Speech consult - Stroke team to follow - LTM if she has another episode of lethargy - Continue high dose thiamine  -- Patient seen and examined by NP/APP with MD.  Janine Ores, DNP, FNP-BC [facility] Pager: [phone]  Electronically signed: Dr. Kerney Elbe

## 2022-06-03 NOTE — Assessment & Plan Note (Signed)
MRI with small acute infarcts in L corona radiata and R parietal cortex Neurology c/s Echo, carotid US, MRA brain without contrast A1c, lipid panel PT/OT/SLP

## 2022-06-03 NOTE — Progress Notes (Signed)
PROGRESS NOTE    Carolyn Russo  EUM:[id number] DOB: 1951/09/03 DOA: 05/10/2022 PCP: [name], MD  Chief Complaint  Patient presents with   Cellulitis    Brief Narrative:  Carolyn Russo is Carolyn Russo 71 y.o. female with Carolyn Russo history of ESRD on HD, CVA with left hemiplegia, seizures, diabetes, hypertension, hyperlipidemia, chronic systolic heart failure, PAD, GI bleed. Patient presented secondary to worsening right foot infection, found to have gangrene. Empiric antibiotics initiated and patient underwent Wilborn Membreno right BKA. During hospitalization, course has been complicated by altered mental status, in addition to presumed infection of unknown source. Currently resumed on antibiotics.    Assessment & Plan:   Principal Problem:   Leukocytosis Active Problems:   Gangrene of right foot (HCC)   PAD (peripheral artery disease) (HCC)   Encephalopathy   Shortness of breath   Acute stroke due to ischemia [facility])   Chest pain   History of CVA (cerebrovascular accident)   Anemia in chronic kidney disease   ESRD on dialysis ([place])   Type 2 diabetes mellitus (HCC)   Obesity (BMI 30-39.9)   Hypertensive disorder   Dyslipidemia (high LDL; low HDL)   CAD (coronary artery disease), native coronary artery   Asthma   Diabetic retinopathy (HCC)   Basal ganglia infarction (HCC)   Foot infection   Below-knee amputation of right lower extremity (HCC)   Seizures (HCC)   Assessment and Plan: * Leukocytosis Afebrile, currently on vanc/zosyn empirically -> will continue for now Unclear cause at this time -> but gradually improving CXR 8/7 without acute findings Blood cx from 8/5 with bacillus species (suspected contaminant) Will repeat blood cx 8/10   Gangrene of right foot (HCC) Empiric Vancomycin and Cefepime initiated on admission. Vascular and orthopedic surgery consulted. Patient underwent right BKA on 7/26 with subsequent discontinuation of antibiotics. PT/OT consulted with recommendations for SNF,  however family is hoping for possible discharge to CIR. Patient with some pain of her leg but manageable. Wound vacuum removed on 8/6 for evaluation for infection (per Dr. Sharol Given on 8/7, no signs of infection).  PAD (peripheral artery disease) (HCC) plavix  Encephalopathy Initial workup without specific/reversible etiology identified. Possibly related to medication effect. Patient with slow but positive recovery of mental status function. On 8/8, patient found to be lethargic with [name] blood sugar of 46. D50 given. Persistent stupor. Possible she could have had Eryk Beavers seizure from hypoglycemia with resultant post-ictal phase. Cefepime recently started which could also have contributed, but episode seems to have coincided with hypoglycemic episode. -ABG without hypercarbia -Ammonia 11 on 04/2022 -vitamin b12 wnl, folate wnl -EEG without seizures or epileptiform discharges -CT head without acute process -cefepime d/c'd -high dose thiamine -MRI with small acute infarcts in the L corona radiata and R parietal cortex -> per neurology, these don't explain encephalopathy, recommending LTM EEG for recurrent lethargy  Acute stroke due to ischemia [facility]) MRI with small acute infarcts in L corona radiata and R parietal cortex Neurology c/s Echo, carotid US, MRA brain without contrast A1c, lipid panel PT/OT/SLP   Shortness of breath Currently on RA CXR today favors atelectasis Per discussion with husband, this is chronic complaint, w/u further if recurrent or worsening  History of CVA (cerebrovascular accident) Resultant L hemiplegia, more notable weakness today per discussion with husband? ? Related to episode of hypoglycemia/AMS -> MRI as above  Chest pain Reproducible per previous provider  ESRD on dialysis [facility]) Renal c/s, appreciate assistance  Anemia in chronic kidney disease trend  Type  2 diabetes mellitus ([place]) SSI, hold basal/bolus with episode of hypoglycemia causing  confusion     DVT prophylaxis: heparin Code Status: full Family Communication: husband Disposition:   Status is: Inpatient Remains inpatient appropriate because: pending further improvement    Consultants:  renal  Procedures:  Right BKA (7/26)  Antimicrobials:  Anti-infectives (From admission, onward)    Start     Dose/Rate Route Frequency Ordered Stop   05/31/22 1430  piperacillin-tazobactam (ZOSYN) IVPB 2.25 g        2.25 g 100 mL/hr over 30 Minutes Intravenous Every 8 hours 05/31/22 1354     05/30/22 1200  vancomycin (VANCOREADY) IVPB 750 mg/150 mL        750 mg 150 mL/hr over 60 Minutes Intravenous Every M-W-F (Hemodialysis) 05/28/22 1754     05/28/22 1845  vancomycin (VANCOREADY) IVPB 1250 mg/250 mL        1,250 mg 166.7 mL/hr over 90 Minutes Intravenous  Once 05/28/22 1754 05/28/22 2108   05/28/22 1845  ceFEPIme (MAXIPIME) 1 g in sodium chloride 0.9 % 100 mL IVPB  Status:  Discontinued        1 g 200 mL/hr over 30 Minutes Intravenous Every 24 hours 05/28/22 1754 05/31/22 1241   05/18/22 1300  ceFAZolin (ANCEF) IVPB 2g/100 mL premix  Status:  Discontinued        2 g 200 mL/hr over 30 Minutes Intravenous Every 8 hours 05/18/22 1212 05/18/22 1233   05/14/22 1130  vancomycin (VANCOCIN) IVPB 1000 mg/200 mL premix        1,000 mg 200 mL/hr over 60 Minutes Intravenous  Once 05/14/22 1038 05/14/22 1429   05/11/22 1900  ceFEPIme (MAXIPIME) 1 g in sodium chloride 0.9 % 100 mL IVPB  Status:  Discontinued        1 g 200 mL/hr over 30 Minutes Intravenous Every 24 hours 05/10/22 1715 05/18/22 1633   05/11/22 1200  vancomycin (VANCOCIN) IVPB 1000 mg/200 mL premix  Status:  Discontinued        1,000 mg 200 mL/hr over 60 Minutes Intravenous Every M-W-F (Hemodialysis) 05/10/22 1715 05/19/22 0925   05/10/22 1730  vancomycin (VANCOREADY) IVPB 1750 mg/350 mL        1,750 mg 175 mL/hr over 120 Minutes Intravenous  Once 05/10/22 1715 05/10/22 2151   05/10/22 1730  ceFEPIme (MAXIPIME)  2 g in sodium chloride 0.9 % 100 mL IVPB        2 g 200 mL/hr over 30 Minutes Intravenous  Once 05/10/22 1715 05/10/22 1840       Subjective: C/o SOB( satting well - husband notes this has been chronic complaint)  Objective: Vitals:   06/03/22 1139 06/03/22 1205 06/03/22 1244 06/03/22 1547  BP:  (!) 123/59 (!) 134/48 (!) 106/55  Pulse:  83 84 71  Resp:  '13 20 14  '$ Temp:  98.6 F (37 C) 98.6 F (37 C) 98.1 F (36.7 C)  TempSrc:    Oral  SpO2:  100% 100% 100%  Weight: 78.9 kg     Height:        Intake/Output Summary (Last 24 hours) at 06/03/2022 1716 Last data filed at 06/03/2022 1600 Gross per 24 hour  Intake 382.5 ml  Output 1550 ml  Net -1167.5 ml   Filed Weights   06/03/22 0717 06/03/22 0729 06/03/22 1139  Weight: 80.9 kg 80.9 kg 78.9 kg    Examination:  General: No acute distress. Cardiovascular: RRR Lungs: unlabored Abdomen: Soft, nontender, nondistended  Neurological: less  lethargic than yesterday, but continued lethargy overall, L sided weakness Extremities: R BKA   Data Reviewed: I have personally reviewed following labs and imaging studies  CBC: Recent Labs  Lab 05/29/22 0404 05/30/22 0737 05/31/22 0820 05/31/22 1049 06/01/22 1309 06/02/22 0702 06/03/22 0138  WBC 25.5* 19.1* 22.2*  --  21.8* 16.4* 16.1*  NEUTROABS 16.8*  --   --   --  15.2* 11.5* 10.5*  HGB 9.1* 9.5* 9.7* 11.2* 8.7* 9.0* 9.2*  HCT 27.1* 27.8* 28.2* 33.0* 24.8* 25.6* 26.5*  MCV 88.6 89.1 89.0  --  88.9 88.3 88.9  PLT 318 356 339  --  327 339 846    Basic Metabolic Panel: Recent Labs  Lab 05/30/22 1510 05/31/22 1049 06/01/22 1309 06/02/22 0702 06/03/22 0138  NA 127* 128* 128* 131* 128*  K 3.8 3.7 4.4 3.4* 3.6  CL 89*  --  92* 92* 92*  CO2 23  --  20* 24 23  GLUCOSE 151*  --  162* 82 116*  BUN 74*  --  81* 26* 41*  CREATININE 8.67*  --  9.67* 4.58* 5.79*  CALCIUM 9.0  --  8.7* 8.4* 8.4*  MG  --   --   --  1.6* 1.6*  PHOS 5.3*  --   --  3.8 4.8*     GFR: Estimated Creatinine Clearance: 9 mL/min (Thales Knipple) (by C-G formula based on SCr of 5.79 mg/dL (H)).  Liver Function Tests: Recent Labs  Lab 05/30/22 1510 06/01/22 1309 06/02/22 0702 06/03/22 0138  AST  --  14* 15 14*  ALT  --  '14 15 15  '$ ALKPHOS  --  55 58 52  BILITOT  --  0.3 0.9 0.8  PROT  --  5.3* 6.1* 5.9*  ALBUMIN 2.5* 2.2* 2.3* 2.4*    CBG: Recent Labs  Lab 06/02/22 1613 06/02/22 2233 06/03/22 1024 06/03/22 1209 06/03/22 1530  GLUCAP 118* 128* 98 87 141*     Recent Results (from the past 240 hour(s))  Culture, blood (Routine X 2) w Reflex to ID Panel     Status: None   Collection Time: 05/28/22  6:10 PM   Specimen: BLOOD LEFT HAND  Result Value Ref Range Status   Specimen Description BLOOD LEFT HAND  Final   Special Requests   Final    BOTTLES DRAWN AEROBIC AND ANAEROBIC Blood Culture results may not be optimal due to an inadequate volume of blood received in culture bottles   Culture   Final    NO GROWTH 5 DAYS Performed at [facility], [place] [address] [place], [place] [address]    Report Status 06/02/2022 FINAL  Final  Culture, blood (Routine X 2) w Reflex to ID Panel     Status: Abnormal   Collection Time: 05/28/22  6:10 PM   Specimen: BLOOD LEFT HAND  Result Value Ref Range Status   Specimen Description BLOOD LEFT HAND  Final   Special Requests   Final    BOTTLES DRAWN AEROBIC AND ANAEROBIC Blood Culture adequate volume   Culture  Setup Time   Final    GRAM POSITIVE RODS AEROBIC BOTTLE ONLY CRITICAL RESULT CALLED TO, READ BACK BY AND VERIFIED WITH: PHARMD BEN.M AT 0919 ON 05/28/2022 BY T.SAAD.    Culture (Keigan Girten)  Final    BACILLUS SPECIES Standardized susceptibility testing for this organism is not available. Performed at [facility], [place] [address] [place], [place] [address]    Report Status 05/30/2022 FINAL  Final  Culture, blood (Routine X 2) w Reflex to ID Panel     Status: None (Preliminary result)   Collection Time: 06/02/22   7:02 AM   Specimen: BLOOD LEFT HAND  Result Value Ref Range Status   Specimen Description BLOOD LEFT HAND  Final   Special Requests   Final    BOTTLES DRAWN AEROBIC AND ANAEROBIC Blood Culture adequate volume   Culture   Final    NO GROWTH < 24 HOURS Performed at [facility], [place] [address] [place], [place] [address]    Report Status PENDING  Incomplete  Culture, blood (Routine X 2) w Reflex to ID Panel     Status: None (Preliminary result)   Collection Time: 06/02/22  7:08 AM   Specimen: BLOOD LEFT HAND  Result Value Ref Range Status   Specimen Description BLOOD LEFT HAND  Final   Special Requests   Final    BOTTLES DRAWN AEROBIC AND ANAEROBIC Blood Culture results may not be optimal due to an inadequate volume of blood received in culture bottles   Culture   Final    NO GROWTH < 24 HOURS Performed at [facility], [place] [address], [place], [place] [address]    Report Status PENDING  Incomplete         Radiology Studies: DG CHEST PORT 1 VIEW  Result Date: 06/03/2022 CLINICAL DATA:  Shortness of breath. EXAM: PORTABLE CHEST 1 VIEW COMPARISON:  Chest x-ray 05/30/2022. FINDINGS: Mild streaky left basilar opacities. No confluent consolidation. No visible pleural effusions or pneumothorax. Similar cardiomediastinal silhouette, which is mildly enlarged. Right IJ approach central venous catheter with the tip projecting at the right atrium. IMPRESSION: Mild streaky left basilar opacities, favor atelectasis. Electronically Signed   By: Margaretha Sheffield M.D.   On: 06/03/2022 14:15   MR BRAIN WO CONTRAST  Result Date: 06/03/2022 CLINICAL DATA:  Confusion.  History of end-stage renal disease EXAM: MRI HEAD WITHOUT CONTRAST TECHNIQUE: Multiplanar, multiecho pulse sequences of the brain and surrounding structures were obtained without intravenous contrast. COMPARISON:  Head CT from 3 days ago. FINDINGS: Brain: Punctate focus of acute ischemia in the left corona radiata. Small  acute cortical infarct in the right parietal lobe. Remote perforator infarct at the right corona radiata with wallerian degeneration seen descending the brainstem. Confluent ischemic gliosis in the cerebral white matter. No acute hemorrhage, hydrocephalus, or collection. Vascular: Preserved flow voids Skull and upper cervical spine: No focal marrow lesion Sinuses/Orbits: Bilateral cataract resection.  No acute finding Other: Intermittently pronounced motion artifact IMPRESSION: 1. 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 2. Advanced chronic small vessel ischemia. 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 3. Motion degraded. Electronically Signed   By: Jorje Guild M.D.   On: 06/03/2022 03:58        Scheduled Meds:  (feeding supplement) PROSource Plus  30 mL Oral BID BM   acetaminophen  1,000 mg Oral Q8H   vitamin C  1,000 mg Oral Daily   carvedilol  3.125 mg Oral BID WC   Chlorhexidine Gluconate Cloth  6 each Topical Q0600   clopidogrel  75 mg Oral Daily   darbepoetin (ARANESP) injection - DIALYSIS  200 mcg Intravenous Q Fri-HD   docusate sodium  100 mg Oral Daily   feeding supplement (NEPRO CARB STEADY)  237 mL Oral BID BM   heparin injection (subcutaneous)  5,000 Units Subcutaneous Q8H  hydrocortisone   Rectal BID   insulin aspart  0-9 Units Subcutaneous TID WC   levETIRAcetam  500 mg Oral QHS   losartan  100 mg Oral Daily   methylphenidate  5 mg Oral BID WC   mometasone-formoterol  2 puff Inhalation BID   nutrition supplement (JUVEN)  1 packet Oral BID BM   pantoprazole  40 mg Oral Daily   rosuvastatin  10 mg Oral Daily   sevelamer carbonate  0.8 g Oral TID WC   [START ON 06/10/2022] thiamine  100 mg Oral Daily   Continuous Infusions:  magnesium sulfate bolus IVPB     piperacillin-tazobactam (ZOSYN)  IV 2.25 g (06/03/22 1451)   thiamine (VITAMIN B1) injection 500 mg (06/03/22 1527)    Followed by   Derrill Memo ON 06/05/2022] thiamine (VITAMIN B1) injection     vancomycin Stopped (06/03/22 1140)     LOS: 24 days    Time spent: over 30 min    Fayrene Helper, MD [facility]   To contact the attending provider between 7A-7P or the covering provider during after hours 7P-7A, please log into the web site [email or web address] and access using universal [place] password for that web site. If you do not have the password, please call the hospital operator.  06/03/2022, 5:16 PM

## 2022-06-04 ENCOUNTER — Inpatient Hospital Stay (HOSPITAL_COMMUNITY): Payer: Medicare PPO

## 2022-06-04 DIAGNOSIS — I6389 Other cerebral infarction: Secondary | ICD-10-CM

## 2022-06-04 DIAGNOSIS — I639 Cerebral infarction, unspecified: Secondary | ICD-10-CM | POA: Diagnosis not present

## 2022-06-04 DIAGNOSIS — D72829 Elevated white blood cell count, unspecified: Secondary | ICD-10-CM | POA: Diagnosis not present

## 2022-06-04 LAB — GLUCOSE, CAPILLARY
Glucose-Capillary: 140 mg/dL — ABNORMAL HIGH (ref 70–99)
Glucose-Capillary: 174 mg/dL — ABNORMAL HIGH (ref 70–99)
Glucose-Capillary: 186 mg/dL — ABNORMAL HIGH (ref 70–99)
Glucose-Capillary: 138 mg/dL — ABNORMAL HIGH (ref 70–99)

## 2022-06-04 LAB — COMPREHENSIVE METABOLIC PANEL
ALT: 14 U/L (ref 0–44)
AST: 15 U/L (ref 15–41)
Albumin: 2.2 g/dL — ABNORMAL LOW (ref 3.5–5.0)
Alkaline Phosphatase: 64 U/L (ref 38–126)
Anion gap: 13 (ref 5–15)
BUN: 25 mg/dL — ABNORMAL HIGH (ref 8–23)
CO2: 26 mmol/L (ref 22–32)
Calcium: 8.4 mg/dL — ABNORMAL LOW (ref 8.9–10.3)
Chloride: 94 mmol/L — ABNORMAL LOW (ref 98–111)
Creatinine, Ser: 3.81 mg/dL — ABNORMAL HIGH (ref 0.44–1.00)
GFR, Estimated: 12 mL/min — ABNORMAL LOW (ref >60)
Glucose, Bld: 178 mg/dL — ABNORMAL HIGH (ref 70–99)
Potassium: 3.6 mmol/L (ref 3.5–5.1)
Sodium: 133 mmol/L — ABNORMAL LOW (ref 135–145)
Total Bilirubin: 0.7 mg/dL (ref 0.3–1.2)
Total Protein: 5.6 g/dL — ABNORMAL LOW (ref 6.5–8.1)

## 2022-06-04 LAB — CBC WITH DIFFERENTIAL/PLATELET
Abs Immature Granulocytes: 0 10*3/uL (ref 0.00–0.07)
Basophils Absolute: 0.2 10*3/uL — ABNORMAL HIGH (ref 0.0–0.1)
Basophils Relative: 1 %
Eosinophils Absolute: 0.5 10*3/uL (ref 0.0–0.5)
Eosinophils Relative: 3 %
HCT: 25.2 % — ABNORMAL LOW (ref 36.0–46.0)
Hemoglobin: 8.9 g/dL — ABNORMAL LOW (ref 12.0–15.0)
Lymphocytes Relative: 25 %
Lymphs Abs: 3.8 10*3/uL (ref 0.7–4.0)
MCH: 31.8 pg (ref 26.0–34.0)
MCHC: 35.3 g/dL (ref 30.0–36.0)
MCV: 90 fL (ref 80.0–100.0)
Monocytes Absolute: 1.4 10*3/uL — ABNORMAL HIGH (ref 0.1–1.0)
Monocytes Relative: 9 %
Neutro Abs: 9.4 10*3/uL — ABNORMAL HIGH (ref 1.7–7.7)
Neutrophils Relative %: 62 %
Platelets: 341 10*3/uL (ref 150–400)
RBC: 2.8 MIL/uL — ABNORMAL LOW (ref 3.87–5.11)
RDW: 22.8 % — ABNORMAL HIGH (ref 11.5–15.5)
WBC: 15.2 10*3/uL — ABNORMAL HIGH (ref 4.0–10.5)
nRBC: 0.5 % — ABNORMAL HIGH (ref 0.0–0.2)
nRBC: 3 /100 WBC — ABNORMAL HIGH

## 2022-06-04 LAB — ECHOCARDIOGRAM COMPLETE
Area-P 1/2: 3.6 cm2
Height: 65 in
S' Lateral: 2.3 cm
Weight: 2783.09 oz

## 2022-06-04 LAB — HEMOGLOBIN A1C
Hgb A1c MFr Bld: 4.6 % — ABNORMAL LOW (ref 4.8–5.6)
Mean Plasma Glucose: 85.32 mg/dL

## 2022-06-04 NOTE — Progress Notes (Addendum)
STROKE TEAM PROGRESS NOTE   INTERVAL HISTORY Her husband is at bedside. He has noticed facial asymmetry within the last week that was subtle.  He reports right arm lost dexterity after shunt 2022.  We discussed history, current symptoms, ongoing work up, diagnosis and plan of care. All questions were answered.   MRI scan of the brain shows a tiny right parietal as well as left basal ganglia lacunar infarct. Vitals:   06/03/22 2215 06/04/22 0448 06/04/22 0834 06/04/22 0837  BP: 135/86 (!) 119/56 (!) 141/61 136/60  Pulse: 76 77 76 75  Resp: '19 20 19 19  '$ Temp: 98.2 F (36.8 C) 98.5 F (36.9 C) 97.9 F (36.6 C) 97.9 F (36.6 C)  TempSrc: Oral Oral    SpO2: 100% 100% 97% 97%  Weight:      Height:       CBC:  Recent Labs  Lab 06/03/22 0138 06/04/22 0132  WBC 16.1* 15.2*  NEUTROABS 10.5* 9.4*  HGB 9.2* 8.9*  HCT 26.5* 25.2*  MCV 88.9 90.0  PLT 329 366   Basic Metabolic Panel:  Recent Labs  Lab 06/02/22 0702 06/03/22 0138 06/04/22 0132  NA 131* 128* 133*  K 3.4* 3.6 3.6  CL 92* 92* 94*  CO2 '24 23 26  '$ GLUCOSE 82 116* 178*  BUN 26* 41* 25*  CREATININE 4.58* 5.79* 3.81*  CALCIUM 8.4* 8.4* 8.4*  MG 1.6* 1.6*  --   PHOS 3.8 4.8*  --    Lipid Panel: No results for input(s): "CHOL", "TRIG", "HDL", "CHOLHDL", "VLDL", "LDLCALC" in the last 168 hours. HgbA1c:  Recent Labs  Lab 06/04/22 0132  HGBA1C 4.6*   Urine Drug Screen: No results for input(s): "LABOPIA", "COCAINSCRNUR", "LABBENZ", "AMPHETMU", "THCU", "LABBARB" in the last 168 hours.  Alcohol Level No results for input(s): "ETH" in the last 168 hours.  IMAGING past 24 hours VAS US CAROTID  Result Date: 06/04/2022 Carotid Arterial Duplex Study Patient Name:  Carolyn Russo [facility]  Date of Exam:   06/04/2022 Medical Rec #: [id number]       Accession #:    [id number] Date of Birth: 12-22-1950       Patient Gender: F Patient Age:   14 years Exam Location:  [facility] Procedure:      VAS US CAROTID Referring Phys: A POWELL  JR --------------------------------------------------------------------------------  Indications:       CVA. Risk Factors:      Hypertension, hyperlipidemia, Diabetes, coronary artery                    disease, prior CVA. Limitations        Today's exam was limited due to the patient's inability or                    unwillingness to cooperate. Comparison Study:  No previous exam noted. Performing Technologist: Bobetta Lime BS, RVT  Examination Guidelines: A complete evaluation includes B-mode imaging, spectral Doppler, color Doppler, and power Doppler as needed of all accessible portions of each vessel. Bilateral testing is considered an integral part of a complete examination. Limited examinations for reoccurring indications may be performed as noted.  Right Carotid Findings: +----------+--------+--------+--------+------------------+--------+           PSV cm/sEDV cm/sStenosisPlaque DescriptionComments +----------+--------+--------+--------+------------------+--------+ CCA Prox  115     0                                          +----------+--------+--------+--------+------------------+--------+  CCA Distal91      14                                         +----------+--------+--------+--------+------------------+--------+ ICA Prox  133     18              heterogenous               +----------+--------+--------+--------+------------------+--------+ ICA Distal130     23                                         +----------+--------+--------+--------+------------------+--------+ ECA       174     0                                          +----------+--------+--------+--------+------------------+--------+ +----------+--------+-------+---------+-------------------+           PSV cm/sEDV cmsDescribe Arm Pressure (mmHG) +----------+--------+-------+---------+-------------------+ Subclavian209            Turbulent                     +----------+--------+-------+---------+-------------------+ +---------+--------+--+--------+--+---------+ VertebralPSV cm/s85EDV cm/s13Antegrade +---------+--------+--+--------+--+---------+  Left Carotid Findings: +----------+--------+--------+--------+------------------+--------+           PSV cm/sEDV cm/sStenosisPlaque DescriptionComments +----------+--------+--------+--------+------------------+--------+ CCA Prox  144     10                                         +----------+--------+--------+--------+------------------+--------+ CCA Distal136     14                                         +----------+--------+--------+--------+------------------+--------+ ICA Prox  108     13              heterogenous               +----------+--------+--------+--------+------------------+--------+ ICA Distal157     23                                         +----------+--------+--------+--------+------------------+--------+ ECA       148     0                                          +----------+--------+--------+--------+------------------+--------+ +----------+--------+--------+----------------+-------------------+           PSV cm/sEDV cm/sDescribe        Arm Pressure (mmHG) +----------+--------+--------+----------------+-------------------+ [id number]             Multiphasic, WNL                    +----------+--------+--------+----------------+-------------------+ +---------+--------+---+--------+--+---------+ VertebralPSV cm/s113EDV cm/s13Antegrade +---------+--------+---+--------+--+---------+   Summary: Right Carotid: Velocities in the right ICA are consistent with a 1-39% stenosis. Left Carotid: Velocities in the left ICA are consistent with a 1-39% stenosis.  Vertebrals:  Bilateral vertebral arteries demonstrate antegrade flow. Subclavians: Right subclavian artery flow was disturbed. Normal flow              hemodynamics were seen in the left  subclavian artery. *See table(s) above for measurements and observations.     Preliminary    MR ANGIO HEAD WO CONTRAST  Result Date: 06/04/2022 CLINICAL DATA:  Follow-up examination for stroke. EXAM: MRA HEAD WITHOUT CONTRAST TECHNIQUE: Angiographic images of the Circle of Willis were acquired using MRA technique without intravenous contrast. COMPARISON:  Comparison made with previous brain MRI from earlier the same day as well as prior CTA from 05/05/2021. FINDINGS: Anterior circulation: Examination severely degraded by motion artifact, limiting assessment. Visualized distal cervical segments of the internal carotid arteries are patent with antegrade flow. Both internal carotid arteries remain patent through the siphons without obvious stenosis. A1 segments patent. Right A1 hypoplastic. Grossly normal anterior communicating artery complex. Both ACAs grossly patent to their distal aspects on time-of-flight sequence without appreciable stenosis. M1 segments patent without obvious stenosis or occlusion. No visible or obvious proximal MCA branch occlusion. Distal MCA branches grossly perfused and symmetric on time-of-flight sequence. Posterior circulation: Vertebral arteries are code dominant and patent without visible stenosis. Both PICA appear to be grossly patent at their origins. Basilar patent to its distal aspect without appreciable stenosis. Superior cerebellar arteries grossly patent at their origins. Both PCA supplied via the basilar as well as prominent bilateral posterior communicating arteries. Both PCAs appear grossly perfused and patent to their distal aspects on time-of-flight sequence. No visible stenosis. Anatomic variants: As above. Other: Evaluation for aneurysm extremely limited on this exam. IMPRESSION: 1. Severely motion degraded exam. 2. Grossly negative intracranial MRA for large vessel occlusion. No obvious hemodynamically significant or correctable stenosis. Electronically Signed   By:  Jeannine Boga M.D.   On: 06/04/2022 05:11   DG CHEST PORT 1 VIEW  Result Date: 06/03/2022 CLINICAL DATA:  Shortness of breath. EXAM: PORTABLE CHEST 1 VIEW COMPARISON:  Chest x-ray 05/30/2022. FINDINGS: Mild streaky left basilar opacities. No confluent consolidation. No visible pleural effusions or pneumothorax. Similar cardiomediastinal silhouette, which is mildly enlarged. Right IJ approach central venous catheter with the tip projecting at the right atrium. IMPRESSION: Mild streaky left basilar opacities, favor atelectasis. Electronically Signed   By: Margaretha Sheffield M.D.   On: 06/03/2022 14:15    PHYSICAL EXAM   Temp:  [97.9 F (36.6 C)-98.6 F (37 C)] 98.6 F (37 C) (08/12 1320) Pulse Rate:  [63-77] 63 (08/12 1320) Resp:  [18-20] 18 (08/12 1320) BP: (119-150)/(56-86) 150/62 (08/12 1320) SpO2:  [97 %-100 %] 97 % (08/12 1320)  General -frail elderly African-American female lying in bed in no apparent distress. Cardiovascular - Regular rhythm and rate. Psch: Calm and cooperative  Mental Status -  Slow processing.  Orientation to time, place, and person were intact. Speech is not fluent. Speaks 2-3 words at at time.  Attention span and concentration were impaired. Recent and remote memory were impaired.   Cranial Nerves II - XII - II - Visual field intact OU. III, IV, VI - Extraocular movements intact. V - Facial sensation intact bilaterally. VII - Subtle left facial weakness VIII - Hearing & vestibular intact bilaterally. X - Palate elevates symmetrically. XI - Chin turning & shoulder shrug intact bilaterally. XII - Tongue protrusion intact.  Motor Strength -  Motor Tone - LUE weakness at least antigravity, LLE is antigravity. RUE antigravity, RLE s/p AKA not moved against gravity  0/5.     Sensory - Light touch bilaterally in upper extremities  Coordination - Not able to cooperate d/t AMS and AKA Gait and Station - deferred.    ASSESSMENT/PLAN Carolyn Russo  is a 71 y.o. female with a past medical history of hypertension, HLD, type 2 DM, CAD, 2 previous CVAs (2017 and 2022), PAD s/p recent stent and on Plavix, ESRD on HD MWF, and known anesthesia complication who has been hospitalized for 25 days initially presented with dry gangrene of the right foot treated with empiric antibiotics who is now s/p right BKA performed on 7/26. Her long hospital course has been complicated by altered mental status. CT head 7/27 was without acute findings. Neurology consulted 7/28 for AMS with LTM showing diffuse encephalopathy with many possible underlying toxic/metabolic sources of encephalopathy identified. AMS again became an issue and MRI obtained on 8/11 showed two acute punctate infarcts for which the stroke team has been consulted.   Stroke:  Small acute infarcts in the left corona radiata and right parietal cortex which are likely due to small vessel disease, incidentally noted and not explanatory for patient's altered mental status which is likely multifactorial.   MRI brain Small acute infarcts in the left corona radiata and right parietal cortex. Advanced chronic small vessel ischemia. 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 Motion degraded.   MRA head  1. Severely motion degraded exam. 2. Grossly negative intracranial MRA for large vessel occlusion. No obvious hemodynamically significant or correctable stenosis.  Carotid Doppler   Right Carotid: Velocities in the right ICA are consistent with a 1-39%  stenosis.  Left Carotid: Velocities in the left ICA are consistent with a 1-39%  stenosis.  Vertebrals:  Bilateral vertebral arteries demonstrate antegrade flow.  Subclavians: Right subclavian artery flow was disturbed. Normal flow hemodynamics were seen in the left subclavian artery.  2D Echo PENDING LDL 39 HgbA1c 4.6 VTE prophylaxis - Recommended    Diet   Diet renal with fluid  restriction Fluid restriction: 1200 mL Fluid; Room service appropriate? Yes; Fluid consistency: Thin   Recommend DAPT with ASA 81 mg and Brilinta '90mg'$  BID x 30 days then ASA '81mg'$  alone as monotherapy unless vascular team feels that May 2023 PTA stenting requires dual antiplatelet therapy. If affordable and agreeable with vascular team brilinta may also be considerd for monotherapy or with aspirin.  Recommend 30 day heart monitoring at discharge. Therapy recommendations:  SNF Disposition:  TBD  Hypertension Stable Permissive hypertension (OK if < 220/120) but gradually normalize in 5-7 days Long-term BP goal normotensive  Hyperlipidemia Home meds:  Crestor '10mg'$ , resumed in hospital High intensity statin not indicated as LDL is at goal Continue statin at discharge  Diabetes type II Controlled HgbA1c 4.6, goal < 7.0 CBGs Recent Labs    06/03/22 2207 06/04/22 0836 06/04/22 1106  GLUCAP 136* 140* 174*    SSI  Other Stroke Risk Factors Advanced Age >/= 65  Obesity, Body mass index is 28.95 kg/m., BMI >/= 30 associated with increased stroke risk, recommend weight loss, diet and exercise as appropriate  Hx stroke/TIA CHF  Seizure Continue  Keppra '500mg'$  daily  Consider LTM if lethargy recurs   Other Active Problems   Hospital day # 25  This patient was seen and evaluated with Dr. Leonie Man. He directed the plan of care. Charlene Brooke, NP-C   STROKE MD NOTE :  I have personally obtained history,examined this patient, reviewed notes, independently viewed  imaging studies, participated in medical decision making and plan of care.ROS completed by me personally and pertinent positives fully documented  I have made any additions or clarifications directly to the above note. Agree with note above.  Patient presented with altered mental status which is likely multifactorial due to possibility of seizures and recent infection and surgery requiring amputation with MRI scan shows tiny  right parietal and left subcortical lacunar infarcts which are likely incidental and related to small vessel disease.  Recommend aspirin and Brilinta for 30 days followed by both alone in case vascular surgery feels she merits dual antiplatelet therapy otherwise will stay on Brilinta alone.  Continue ongoing stroke work-up.  She may need 30-day heart monitor at discharge to look for paroxysmal A-fib.  Continue ongoing therapies.  Long discussion with patient and husband at the bedside and answered questions.  Greater than 50% time during this 50-minute visit was spent in counseling and coordination of care and discussion patient and care team and answering questions.  Antony Contras, MD Medical Director [facility] Pager: [phone] 06/04/2022 5:10 PM  To contact Stroke Continuity provider, please refer to [email or web address] After hours, contact General Neurology

## 2022-06-04 NOTE — Progress Notes (Signed)
[facility] Progress Note   Subjective:   Reports her breathing is "horrible," worse when laying flat. She says it started this AM but actually appears to be a chronic issue per prior notes. CXR yesterday with atelectasis. O2 saturation is 100% on RA and respirations unlabored. Denies CP, dizziness and nausea.   Objective Vitals:   06/03/22 2215 06/04/22 0448 06/04/22 0834 06/04/22 0837  BP: 135/86 (!) 119/56 (!) 141/61 136/60  Pulse: 76 77 76 75  Resp: '19 20 19 19  '$ Temp: 98.2 F (36.8 C) 98.5 F (36.9 C) 97.9 F (36.6 C) 97.9 F (36.6 C)  TempSrc: Oral Oral    SpO2: 100% 100% 97% 97%  Weight:      Height:       Physical Exam General: Alert female in NAD Heart: RRR, no murmurs, rubs or gallops Lungs: CTA bilaterally without wheezing, rhonchi or rales Abdomen: Soft, non-distended, +BS Extremities: No edema b/l lower extremities Dialysis Access: [facility]  Additional Objective Labs: Basic Metabolic Panel: Recent Labs  Lab 05/30/22 1510 05/31/22 1049 06/02/22 0702 06/03/22 0138 06/04/22 0132  NA 127*   < > 131* 128* 133*  K 3.8   < > 3.4* 3.6 3.6  CL 89*   < > 92* 92* 94*  CO2 23   < > '24 23 26  '$ GLUCOSE 151*   < > 82 116* 178*  BUN 74*   < > 26* 41* 25*  CREATININE 8.67*   < > 4.58* 5.79* 3.81*  CALCIUM 9.0   < > 8.4* 8.4* 8.4*  PHOS 5.3*  --  3.8 4.8*  --    < > = values in this interval not displayed.   Liver Function Tests: Recent Labs  Lab 06/02/22 0702 06/03/22 0138 06/04/22 0132  AST 15 14* 15  ALT '15 15 14  '$ ALKPHOS 58 52 64  BILITOT 0.9 0.8 0.7  PROT 6.1* 5.9* 5.6*  ALBUMIN 2.3* 2.4* 2.2*   No results for input(s): "LIPASE", "AMYLASE" in the last 168 hours. CBC: Recent Labs  Lab 05/31/22 0820 05/31/22 1049 06/01/22 1309 06/02/22 0702 06/03/22 0138 06/04/22 0132  WBC 22.2*  --  21.8* 16.4* 16.1* 15.2*  NEUTROABS  --   --  15.2* 11.5* 10.5* 9.4*  HGB 9.7*   < > 8.7* 9.0* 9.2* 8.9*  HCT 28.2*   < > 24.8* 25.6* 26.5* 25.2*  MCV  89.0  --  88.9 88.3 88.9 90.0  PLT 339  --  327 339 329 341   < > = values in this interval not displayed.   Blood Culture    Component Value Date/Time   SDES BLOOD LEFT HAND 06/02/2022 0708   SPECREQUEST  06/02/2022 0708    BOTTLES DRAWN AEROBIC AND ANAEROBIC Blood Culture results may not be optimal due to an inadequate volume of blood received in culture bottles   CULT  06/02/2022 0708    NO GROWTH 2 DAYS Performed at [facility], [place] [address], [place], [place] [address]    REPTSTATUS PENDING 06/02/2022 0708    Cardiac Enzymes: No results for input(s): "CKTOTAL", "CKMB", "CKMBINDEX", "TROPONINI" in the last 168 hours. CBG: Recent Labs  Lab 06/03/22 1024 06/03/22 1209 06/03/22 1530 06/03/22 2207 06/04/22 0836  GLUCAP 98 87 141* 136* 140*   Iron Studies: No results for input(s): "IRON", "TIBC", "TRANSFERRIN", "FERRITIN" in the last 72 hours. '@lablastinr3'$ @ Studies/Results: MR ANGIO HEAD WO CONTRAST  Result Date: 06/04/2022 CLINICAL DATA:  Follow-up examination for stroke. EXAM:  MRA HEAD WITHOUT CONTRAST TECHNIQUE: Angiographic images of the Circle of Willis were acquired using MRA technique without intravenous contrast. COMPARISON:  Comparison made with previous brain MRI from earlier the same day as well as prior CTA from 05/05/2021. FINDINGS: Anterior circulation: Examination severely degraded by motion artifact, limiting assessment. Visualized distal cervical segments of the internal carotid arteries are patent with antegrade flow. Both internal carotid arteries remain patent through the siphons without obvious stenosis. A1 segments patent. Right A1 hypoplastic. Grossly normal anterior communicating artery complex. Both ACAs grossly patent to their distal aspects on time-of-flight sequence without appreciable stenosis. M1 segments patent without obvious stenosis or occlusion. No visible or obvious proximal MCA branch occlusion. Distal MCA branches grossly perfused and  symmetric on time-of-flight sequence. Posterior circulation: Vertebral arteries are code dominant and patent without visible stenosis. Both PICA appear to be grossly patent at their origins. Basilar patent to its distal aspect without appreciable stenosis. Superior cerebellar arteries grossly patent at their origins. Both PCA supplied via the basilar as well as prominent bilateral posterior communicating arteries. Both PCAs appear grossly perfused and patent to their distal aspects on time-of-flight sequence. No visible stenosis. Anatomic variants: As above. Other: Evaluation for aneurysm extremely limited on this exam. IMPRESSION: 1. Severely motion degraded exam. 2. Grossly negative intracranial MRA for large vessel occlusion. No obvious hemodynamically significant or correctable stenosis. Electronically Signed   By: Jeannine Boga M.D.   On: 06/04/2022 05:11   DG CHEST PORT 1 VIEW  Result Date: 06/03/2022 CLINICAL DATA:  Shortness of breath. EXAM: PORTABLE CHEST 1 VIEW COMPARISON:  Chest x-ray 05/30/2022. FINDINGS: Mild streaky left basilar opacities. No confluent consolidation. No visible pleural effusions or pneumothorax. Similar cardiomediastinal silhouette, which is mildly enlarged. Right IJ approach central venous catheter with the tip projecting at the right atrium. IMPRESSION: Mild streaky left basilar opacities, favor atelectasis. Electronically Signed   By: Margaretha Sheffield M.D.   On: 06/03/2022 14:15   MR BRAIN WO CONTRAST  Result Date: 06/03/2022 CLINICAL DATA:  Confusion.  History of end-stage renal disease EXAM: MRI HEAD WITHOUT CONTRAST TECHNIQUE: Multiplanar, multiecho pulse sequences of the brain and surrounding structures were obtained without intravenous contrast. COMPARISON:  Head CT from 3 days ago. FINDINGS: Brain: Punctate focus of acute ischemia in the left corona radiata. Small acute cortical infarct in the right parietal lobe. Remote perforator infarct at the right corona  radiata with wallerian degeneration seen descending the brainstem. Confluent ischemic gliosis in the cerebral white matter. No acute hemorrhage, hydrocephalus, or collection. Vascular: Preserved flow voids Skull and upper cervical spine: No focal marrow lesion Sinuses/Orbits: Bilateral cataract resection.  No acute finding Other: Intermittently pronounced motion artifact IMPRESSION: 1. 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 2. Advanced chronic small vessel ischemia. 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 3. Motion degraded. Electronically Signed   By: Jorje Guild M.D.   On: 06/03/2022 03:58   Medications:  magnesium sulfate bolus IVPB     piperacillin-tazobactam (ZOSYN)  IV 2.25 g (06/04/22 0403)   thiamine (VITAMIN B1) injection 500 mg (06/04/22 0436)   Followed by   Derrill Memo ON 06/05/2022] thiamine (VITAMIN B1) injection     vancomycin Stopped (06/03/22 1140)    (feeding supplement) PROSource Plus  30 mL Oral BID BM   acetaminophen  1,000 mg Oral Q8H   vitamin C  1,000 mg Oral Daily   aspirin EC  81 mg Oral  Daily   capsaicin   Topical BID   carvedilol  3.125 mg Oral BID WC   Chlorhexidine Gluconate Cloth  6 each Topical Q0600   clopidogrel  75 mg Oral Daily   darbepoetin (ARANESP) injection - DIALYSIS  200 mcg Intravenous Q Fri-HD   docusate sodium  100 mg Oral Daily   feeding supplement (NEPRO CARB STEADY)  237 mL Oral BID BM   heparin injection (subcutaneous)  5,000 Units Subcutaneous Q8H   hydrocortisone   Rectal BID   insulin aspart  0-9 Units Subcutaneous TID WC   levETIRAcetam  500 mg Oral QHS   losartan  100 mg Oral Daily   methylphenidate  5 mg Oral BID WC   mometasone-formoterol  2 puff Inhalation BID   nutrition supplement (JUVEN)  1 packet Oral BID BM   pantoprazole  40 mg Oral Daily   rosuvastatin  10 mg Oral Daily   sevelamer carbonate  0.8 g Oral TID WC   [START ON  06/10/2022] thiamine  100 mg Oral Daily    Dialysis Orders: MWF - [place] 4 hrs 180NRE 350/700 88.5 kg 2.0 K/2.0 Ca UFP 4 No heparin - Mircera 200 mcg  IV q 2wks - last 04/29/22 - Venofer '100mg'$  IV X 5 doses-new order, hasn't been given yet - Hectorol 67mg IV TIW  Assessment/Plan: 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 DSharol Given Previously off antibiotics, resumed empirically with rising WBC. On IV Vanc/Zosyn per primary.  2. Acute Metabolic Encephalopathy/Intermittent Confusion: Seroquel stopped, minimizing sedatives. CT head negative. Cefepime discontinued. Waxing and waning. Managed by primary. 3. ESRD: Continue HD on usual MWF schedule, tolerating well 4. Anemia of ESRD: Hgb 9.4-improved. continue weekly Aranesp (2068m q Fri). Was on IV iron, stopped 8/5 because of ongoing infection. Secondary hyperparathyroidism : COrrCa high, VDRA on hold. Phos at goal. Continue Renvela as binder. HTN/volume: BP controlled. Is well below outpatient dry weight but Na is slightly low and she reports shortness of breath. Will attempt to increase UF goal with next HD.  7. Nutrition - Very low albumin, continue protein supplements.  8. T2DM: Per primary.  9. Hx CVA: On dual ASA/plavix, and Keppra. 10. Dispo: SNF placement.     SaAnice PaganiniPA-C 06/04/2022, 9:05 AM  [facility] Pager: [phone]

## 2022-06-04 NOTE — Plan of Care (Signed)
  Problem: Education: Goal: Ability to describe self-care measures that may prevent or decrease complications (Diabetes Survival Skills Education) will improve Outcome: Progressing Goal: Individualized Educational Video(s) Outcome: Progressing   Problem: Coping: Goal: Ability to adjust to condition or change in health will improve Outcome: Progressing   Problem: Fluid Volume: Goal: Ability to maintain a balanced intake and output will improve Outcome: Progressing   Problem: Health Behavior/Discharge Planning: Goal: Ability to identify and utilize available resources and services will improve Outcome: Progressing Goal: Ability to manage health-related needs will improve Outcome: Progressing   Problem: Metabolic: Goal: Ability to maintain appropriate glucose levels will improve Outcome: Progressing   Problem: Nutritional: Goal: Maintenance of adequate nutrition will improve Outcome: Progressing Goal: Progress toward achieving an optimal weight will improve Outcome: Progressing   Problem: Skin Integrity: Goal: Risk for impaired skin integrity will decrease Outcome: Progressing   Problem: Tissue Perfusion: Goal: Adequacy of tissue perfusion will improve Outcome: Progressing   Problem: Education: Goal: Knowledge of General Education information will improve Description: Including pain rating scale, medication(s)/side effects and non-pharmacologic comfort measures Outcome: Progressing   Problem: Health Behavior/Discharge Planning: Goal: Ability to manage health-related needs will improve Outcome: Progressing   Problem: Clinical Measurements: Goal: Ability to maintain clinical measurements within normal limits will improve Outcome: Progressing Goal: Will remain free from infection Outcome: Progressing Goal: Diagnostic test results will improve Outcome: Progressing Goal: Respiratory complications will improve Outcome: Progressing Goal: Cardiovascular complication will  be avoided Outcome: Progressing   Problem: Activity: Goal: Risk for activity intolerance will decrease Outcome: Progressing   Problem: Nutrition: Goal: Adequate nutrition will be maintained Outcome: Progressing   Problem: Coping: Goal: Level of anxiety will decrease Outcome: Progressing   Problem: Elimination: Goal: Will not experience complications related to bowel motility Outcome: Progressing   Problem: Pain Managment: Goal: General experience of comfort will improve Outcome: Progressing   Problem: Safety: Goal: Ability to remain free from injury will improve Outcome: Progressing   Problem: Skin Integrity: Goal: Risk for impaired skin integrity will decrease Outcome: Progressing   Problem: Education: Goal: Knowledge of the prescribed therapeutic regimen will improve Outcome: Progressing Goal: Ability to verbalize activity precautions or restrictions will improve Outcome: Progressing Goal: Understanding of discharge needs will improve Outcome: Progressing   Problem: Activity: Goal: Ability to perform//tolerate increased activity and mobilize with assistive devices will improve Outcome: Progressing   Problem: Clinical Measurements: Goal: Postoperative complications will be avoided or minimized Outcome: Progressing   Problem: Self-Care: Goal: Ability to meet self-care needs will improve Outcome: Progressing   Problem: Self-Concept: Goal: Ability to maintain and perform role responsibilities to the fullest extent possible will improve Outcome: Progressing   Problem: Pain Management: Goal: Pain level will decrease with appropriate interventions Outcome: Progressing   Problem: Skin Integrity: Goal: Demonstration of wound healing without infection will improve Outcome: Progressing

## 2022-06-04 NOTE — Progress Notes (Signed)
  Echocardiogram 2D Echocardiogram has been performed.  Carolyn Russo 06/04/2022, 4:18 PM

## 2022-06-04 NOTE — Progress Notes (Signed)
Bilateral carotid duplex study completed. Please see CV Proc for preliminary results.  Carolyn Russo BS, RVT 06/04/2022 10:29 AM

## 2022-06-04 NOTE — Evaluation (Addendum)
SLP Cancellation Note  Patient Details Name: Carolyn Russo MRN: [id number] DOB: 06-Jan-1951   Cancelled treatment:       Reason Eval/Treat Not Completed: Other (comment) (Pt had undergone swallow evaluation on 8/11 and MD approved to cancel today's order given evaluation already completed.   Macario Golds 06/04/2022, 11:53 AM  Kathleen Lime, MS [facility] SLP Acute Rehab Services Office [phone] Pager [phone]

## 2022-06-04 NOTE — Progress Notes (Signed)
Pharmacy Antibiotic Note  Carolyn Russo is a 71 y.o. 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 On 8/5 had worsening leukocytosis, possible surgical site infection. Pharmacy has been consulted for vancomycin and zosyn dosing. Patient is on HD MWF, last dose of vancomycin 8/11 after full 4 hr dialysis session.   Plan: Continue Zosyn 2.25g Q8h Continue Vancomycin '750mg'$  IV MWF post HD  Trend WBC, fever, renal function F/u cultures, clinical progress, levels as indicated De-escalate when able   Height: '5\' 5"'$  (165.1 cm) Weight: 78.9 kg (173 lb 15.1 oz) IBW/kg (Calculated) : 57  Temp (24hrs), Avg:98.3 F (36.8 C), Min:97.9 F (36.6 C), Max:98.6 F (37 C)  Recent Labs  Lab 05/30/22 1510 05/31/22 0820 06/01/22 1309 06/02/22 0702 06/03/22 0138 06/04/22 0132  WBC  --  22.2* 21.8* 16.4* 16.1* 15.2*  CREATININE 8.67*  --  9.67* 4.58* 5.79* 3.81*     Estimated Creatinine Clearance: 13.7 mL/min (A) (by C-G formula based on SCr of 3.81 mg/dL (H)).    Allergies  Allergen Reactions   Food Anaphylaxis    Peanuts; Almonds   Wheat Bran     Upset stomach    Statins Itching and Other (See Comments)    Generalized aches- tolerates crestor    Pork-Derived Products     Does not eat pork    Shellfish Allergy Other (See Comments)    Mouth gets raw   Sitagliptin Other (See Comments)    Januiva - unknown reaction    Tetracycline Other (See Comments)    Raw mouth   Contrast Media [Iodinated Contrast Media] Rash    Happened during CT scan over 30 years ago    Antimicrobials this admission: Zosyn 8/08 >> Vanc 7/18 >> 7/25; 8/05 >> Cefe 7/18 >> 7/25; 8/05>>8/08    Microbiology results: 7/24 BCx: NGTD 7/28 UCx: NGTD 7/28 Bcx: NGTD 7/25 MRSA PCR: neg 8/05 Bcx: bacillus species (aerobic bottle only) - prelim  Thank you for allowing pharmacy to be a part of this patient's care.  Titus Dubin, PharmD PGY1 Pharmacy Resident 06/04/2022 11:57 AM

## 2022-06-04 NOTE — Progress Notes (Signed)
Inpatient Rehab Admissions Coordinator:  Asked to rescreen pt per family request. Per last PT note, pt does not appear to be able to tolerate intensive therapy as required for CIR. Therapies also continue to recommend SNF. Pt continues to not appear appropriate for CIR.  Gayland Curry, [place], [place] Admissions Coordinator [phone]

## 2022-06-04 NOTE — Plan of Care (Signed)

## 2022-06-04 NOTE — Progress Notes (Addendum)
PROGRESS NOTE    Carolyn Russo  FOY:[id number] DOB: July 13, 1951 DOA: 05/10/2022 PCP: [name], MD  Chief Complaint  Patient presents with   Cellulitis    Brief Narrative:  Carolyn Russo is Carolyn Russo 71 y.o. female with Sahmir Weatherbee history of ESRD on HD, CVA with left hemiplegia, seizures, diabetes, hypertension, hyperlipidemia, chronic systolic heart failure, PAD, GI bleed. Patient presented secondary to worsening right foot infection, found to have gangrene. Empiric antibiotics initiated and patient underwent Jolisa Intriago right BKA. During hospitalization, course has been complicated by altered mental status, in addition to presumed infection of unknown source. Currently resumed on antibiotics.    Assessment & Plan:   Principal Problem:   Leukocytosis Active Problems:   Gangrene of right foot (HCC)   PAD (peripheral artery disease) (HCC)   Encephalopathy   Shortness of breath   Acute stroke due to ischemia [facility])   Chest pain   History of CVA (cerebrovascular accident)   Anemia in chronic kidney disease   ESRD on dialysis ([place])   Type 2 diabetes mellitus (HCC)   Obesity (BMI 30-39.9)   Hypertensive disorder   Dyslipidemia (high LDL; low HDL)   CAD (coronary artery disease), native coronary artery   Asthma   Diabetic retinopathy (HCC)   Basal ganglia infarction (HCC)   Foot infection   Below-knee amputation of right lower extremity (HCC)   Seizures (HCC)   Assessment and Plan: * Leukocytosis Afebrile, currently on vanc/zosyn empirically -> will d/c after today and monitor Unclear cause at this time -> but gradually improving CXR 8/7 without acute findings Blood cx from 8/5 with bacillus species (suspected contaminant) Will repeat blood cx 8/10 NGx2   Gangrene of right foot (HCC) Empiric Vancomycin and Cefepime initiated on admission. Vascular and orthopedic surgery consulted. Patient underwent right BKA on 7/26 with subsequent discontinuation of antibiotics. PT/OT consulted with  recommendations for SNF, however family is hoping for possible discharge to CIR. Patient with some pain of her leg but manageable. Wound vacuum removed on 8/6 for evaluation for infection (per Dr. Sharol Given on 8/7, no signs of infection).  PAD (peripheral artery disease) (HCC) plavix  Encephalopathy Initial workup without specific/reversible etiology identified. Possibly related to medication effect. Patient with slow but positive recovery of mental status function. On 8/8, patient found to be lethargic with Sherrey North blood sugar of 46. D50 given. Persistent stupor. Possible she could have had Hady Niemczyk seizure from hypoglycemia with resultant post-ictal phase. Cefepime recently started which could also have contributed, but episode seems to have coincided with hypoglycemic episode. -ABG without hypercarbia -Ammonia 11 on 04/2022 -vitamin b12 wnl, folate wnl -EEG without seizures or epileptiform discharges -CT head without acute process -cefepime d/c'd -high dose thiamine -MRI with small acute infarcts in the L corona radiata and R parietal cortex -> per neurology, these don't explain encephalopathy, recommending LTM EEG for recurrent lethargy Improved mental status today  Acute stroke due to ischemia [facility]) MRI with small acute infarcts in L corona radiata and R parietal cortex Neurology c/s - likely due to small vessel disease Echo with EF 60-65%, no RWMA, grade 1 diastolic dysfunction , carotid US with 1-39% stenosis bilaterally, right subclavian flow disturbed, MRA brain without contrast severly motion degraded, grossly negative intracranial MRA for LVO A1c 4.6, lipid panel PT/OT/SLP Neurology recommending DAPT with aspirin and brilintax30 days (will need to discuss with vascular regarding whether long term need for DAPT)   Shortness of breath Currently on RA CXR today favors atelectasis Per discussion with  husband, this is chronic complaint, w/u further if recurrent or worsening - no complaints  today  History of CVA (cerebrovascular accident) Resultant L hemiplegia, more notable weakness today per discussion with husband? ? Related to episode of hypoglycemia/AMS -> MRI as above  Chest pain Reproducible per previous provider  ESRD on dialysis [facility]) Renal c/s, appreciate assistance  Anemia in chronic kidney disease trend  Type 2 diabetes mellitus ([place]) SSI, hold basal/bolus with episode of hypoglycemia causing confusion     DVT prophylaxis: heparin Code Status: full Family Communication: husband Disposition:   Status is: Inpatient Remains inpatient appropriate because: pending further improvement    Consultants:  renal  Procedures:  Right BKA (7/26)  Antimicrobials:  Anti-infectives (From admission, onward)    Start     Dose/Rate Route Frequency Ordered Stop   05/31/22 1430  piperacillin-tazobactam (ZOSYN) IVPB 2.25 g        2.25 g 100 mL/hr over 30 Minutes Intravenous Every 8 hours 05/31/22 1354 06/05/22 0559   05/30/22 1200  vancomycin (VANCOREADY) IVPB 750 mg/150 mL  Status:  Discontinued        750 mg 150 mL/hr over 60 Minutes Intravenous Every M-W-F (Hemodialysis) 05/28/22 1754 06/04/22 1702   05/28/22 1845  vancomycin (VANCOREADY) IVPB 1250 mg/250 mL        1,250 mg 166.7 mL/hr over 90 Minutes Intravenous  Once 05/28/22 1754 05/28/22 2108   05/28/22 1845  ceFEPIme (MAXIPIME) 1 g in sodium chloride 0.9 % 100 mL IVPB  Status:  Discontinued        1 g 200 mL/hr over 30 Minutes Intravenous Every 24 hours 05/28/22 1754 05/31/22 1241   05/18/22 1300  ceFAZolin (ANCEF) IVPB 2g/100 mL premix  Status:  Discontinued        2 g 200 mL/hr over 30 Minutes Intravenous Every 8 hours 05/18/22 1212 05/18/22 1233   05/14/22 1130  vancomycin (VANCOCIN) IVPB 1000 mg/200 mL premix        1,000 mg 200 mL/hr over 60 Minutes Intravenous  Once 05/14/22 1038 05/14/22 1429   05/11/22 1900  ceFEPIme (MAXIPIME) 1 g in sodium chloride 0.9 % 100 mL IVPB  Status:  Discontinued         1 g 200 mL/hr over 30 Minutes Intravenous Every 24 hours 05/10/22 1715 05/18/22 1633   05/11/22 1200  vancomycin (VANCOCIN) IVPB 1000 mg/200 mL premix  Status:  Discontinued        1,000 mg 200 mL/hr over 60 Minutes Intravenous Every M-W-F (Hemodialysis) 05/10/22 1715 05/19/22 0925   05/10/22 1730  vancomycin (VANCOREADY) IVPB 1750 mg/350 mL        1,750 mg 175 mL/hr over 120 Minutes Intravenous  Once 05/10/22 1715 05/10/22 2151   05/10/22 1730  ceFEPIme (MAXIPIME) 2 g in sodium chloride 0.9 % 100 mL IVPB        2 g 200 mL/hr over 30 Minutes Intravenous  Once 05/10/22 1715 05/10/22 1840       Subjective: More talkative today, making some jokes No complaints  Objective: Vitals:   06/04/22 0448 06/04/22 0834 06/04/22 0837 06/04/22 1320  BP: (!) 119/56 (!) 141/61 136/60 (!) 150/62  Pulse: 77 76 75 63  Resp: '20 19 19 18  '$ Temp: 98.5 F (36.9 C) 97.9 F (36.6 C) 97.9 F (36.6 C) 98.6 F (37 C)  TempSrc: Oral     SpO2: 100% 97% 97% 97%  Weight:      Height:       No intake  or output data in the 24 hours ending 06/04/22 1731  Filed Weights   06/03/22 0717 06/03/22 0729 06/03/22 1139  Weight: 80.9 kg 80.9 kg 78.9 kg    Examination:  General: No acute distress. Cardiovascular: RRR Lungs: unlabored Abdomen: Soft, nontender, nondistended  Neurological: more alert, L sided weakness - speaking more today Extremities: R BKA    Data Reviewed: I have personally reviewed following labs and imaging studies  CBC: Recent Labs  Lab 05/29/22 0404 05/30/22 0737 05/31/22 0820 05/31/22 1049 06/01/22 1309 06/02/22 0702 06/03/22 0138 06/04/22 0132  WBC 25.5*   < > 22.2*  --  21.8* 16.4* 16.1* 15.2*  NEUTROABS 16.8*  --   --   --  15.2* 11.5* 10.5* 9.4*  HGB 9.1*   < > 9.7* 11.2* 8.7* 9.0* 9.2* 8.9*  HCT 27.1*   < > 28.2* 33.0* 24.8* 25.6* 26.5* 25.2*  MCV 88.6   < > 89.0  --  88.9 88.3 88.9 90.0  PLT 318   < > 339  --  327 339 329 341   < > = values in this interval  not displayed.    Basic Metabolic Panel: Recent Labs  Lab 05/30/22 1510 05/31/22 1049 06/01/22 1309 06/02/22 0702 06/03/22 0138 06/04/22 0132  NA 127* 128* 128* 131* 128* 133*  K 3.8 3.7 4.4 3.4* 3.6 3.6  CL 89*  --  92* 92* 92* 94*  CO2 23  --  20* '24 23 26  '$ GLUCOSE 151*  --  162* 82 116* 178*  BUN 74*  --  81* 26* 41* 25*  CREATININE 8.67*  --  9.67* 4.58* 5.79* 3.81*  CALCIUM 9.0  --  8.7* 8.4* 8.4* 8.4*  MG  --   --   --  1.6* 1.6*  --   PHOS 5.3*  --   --  3.8 4.8*  --     GFR: Estimated Creatinine Clearance: 13.7 mL/min (Vearl Allbaugh) (by C-G formula based on SCr of 3.81 mg/dL (H)).  Liver Function Tests: Recent Labs  Lab 05/30/22 1510 06/01/22 1309 06/02/22 0702 06/03/22 0138 06/04/22 0132  AST  --  14* 15 14* 15  ALT  --  '14 15 15 14  '$ ALKPHOS  --  55 58 52 64  BILITOT  --  0.3 0.9 0.8 0.7  PROT  --  5.3* 6.1* 5.9* 5.6*  ALBUMIN 2.5* 2.2* 2.3* 2.4* 2.2*    CBG: Recent Labs  Lab 06/03/22 1530 06/03/22 2207 06/04/22 0836 06/04/22 1106 06/04/22 1607  GLUCAP 141* 136* 140* 174* 186*     Recent Results (from the past 240 hour(s))  Culture, blood (Routine X 2) w Reflex to ID Panel     Status: None   Collection Time: 05/28/22  6:10 PM   Specimen: BLOOD LEFT HAND  Result Value Ref Range Status   Specimen Description BLOOD LEFT HAND  Final   Special Requests   Final    BOTTLES DRAWN AEROBIC AND ANAEROBIC Blood Culture results may not be optimal due to an inadequate volume of blood received in culture bottles   Culture   Final    NO GROWTH 5 DAYS Performed at [facility], [place] [address], [place], [place] [address]    Report Status 06/02/2022 FINAL  Final  Culture, blood (Routine X 2) w Reflex to ID Panel     Status: Abnormal   Collection Time: 05/28/22  6:10 PM   Specimen: BLOOD LEFT HAND  Result Value Ref Range Status  Specimen Description BLOOD LEFT HAND  Final   Special Requests   Final    BOTTLES DRAWN AEROBIC AND ANAEROBIC Blood Culture  adequate volume   Culture  Setup Time   Final    GRAM POSITIVE RODS AEROBIC BOTTLE ONLY CRITICAL RESULT CALLED TO, READ BACK BY AND VERIFIED WITH: PHARMD BEN.M AT 0919 ON 05/28/2022 BY T.SAAD.    Culture (Jelissa Espiritu)  Final    BACILLUS SPECIES Standardized susceptibility testing for this organism is not available. Performed at [facility], [place] [address] [place], [place] [address]    Report Status 05/30/2022 FINAL  Final  Culture, blood (Routine X 2) w Reflex to ID Panel     Status: None (Preliminary result)   Collection Time: 06/02/22  7:02 AM   Specimen: BLOOD LEFT HAND  Result Value Ref Range Status   Specimen Description BLOOD LEFT HAND  Final   Special Requests   Final    BOTTLES DRAWN AEROBIC AND ANAEROBIC Blood Culture adequate volume   Culture   Final    NO GROWTH 2 DAYS Performed at [facility], [place] [address] [place], [place] [address]    Report Status PENDING  Incomplete  Culture, blood (Routine X 2) w Reflex to ID Panel     Status: None (Preliminary result)   Collection Time: 06/02/22  7:08 AM   Specimen: BLOOD LEFT HAND  Result Value Ref Range Status   Specimen Description BLOOD LEFT HAND  Final   Special Requests   Final    BOTTLES DRAWN AEROBIC AND ANAEROBIC Blood Culture results may not be optimal due to an inadequate volume of blood received in culture bottles   Culture   Final    NO GROWTH 2 DAYS Performed at [facility], [place] [address] [place], [place] [address]    Report Status PENDING  Incomplete         Radiology Studies: ECHOCARDIOGRAM COMPLETE  Result Date: 06/04/2022    ECHOCARDIOGRAM REPORT   Patient Name:   JAZZLYNN RAWE [facility] Date of Exam: 06/04/2022 Medical Rec #:  [id number]      Height:       65.0 in Accession #:    [id number]     Weight:       173.9 lb Date of Birth:  Apr 20, 1951      BSA:          1.864 m Patient Age:    27 years       BP:           150/62 mmHg Patient Gender: F              HR:           72 bpm. Exam Location:   Inpatient Procedure: 2D Echo Indications:     stroke  History:         Patient has prior history of Echocardiogram examinations, most                  recent 03/11/2021. CAD, end stage renal disease,                  Signs/Symptoms:Shortness of Breath; Risk Factors:Hypertension,                  Dyslipidemia and Diabetes.  Sonographer:     Johny Chess RDCS Referring Phys:  [place] Diagnosing Phys: Eleonore Chiquito MD  Sonographer Comments: Image acquisition challenging due to uncooperative  patient. IMPRESSIONS  1. Left ventricular ejection fraction, by estimation, is 60 to 65%. The left ventricle has normal function. The left ventricle has no regional wall motion abnormalities. Left ventricular diastolic parameters are consistent with Grade I diastolic dysfunction (impaired relaxation).  2. Right ventricular systolic function is normal. The right ventricular size is normal. There is mildly elevated pulmonary artery systolic pressure. The estimated right ventricular systolic pressure is 22.9 mmHg.  3. The mitral valve is grossly normal. No evidence of mitral valve regurgitation. No evidence of mitral stenosis.  4. The aortic valve is tricuspid. There is mild calcification of the aortic valve. There is mild thickening of the aortic valve. Aortic valve regurgitation is not visualized. Aortic valve sclerosis is present, with no evidence of aortic valve stenosis.  5. The inferior vena cava is dilated in size with >50% respiratory variability, suggesting right atrial pressure of 8 mmHg. Conclusion(s)/Recommendation(s): No intracardiac source of embolism detected on this transthoracic study. Consider Kalyn Hofstra transesophageal echocardiogram to exclude cardiac source of embolism if clinically indicated. FINDINGS  Left Ventricle: Left ventricular ejection fraction, by estimation, is 60 to 65%. The left ventricle has normal function. The left ventricle has no regional wall motion abnormalities. The left  ventricular internal cavity size was normal in size. There is  no left ventricular hypertrophy. Left ventricular diastolic parameters are consistent with Grade I diastolic dysfunction (impaired relaxation). Right Ventricle: The right ventricular size is normal. No increase in right ventricular wall thickness. Right ventricular systolic function is normal. There is mildly elevated pulmonary artery systolic pressure. The tricuspid regurgitant velocity is 2.80  m/s, and with an assumed right atrial pressure of 8 mmHg, the estimated right ventricular systolic pressure is 79.8 mmHg. Left Atrium: Left atrial size was normal in size. Right Atrium: Right atrial size was normal in size. Pericardium: Trivial pericardial effusion is present. Mitral Valve: The mitral valve is grossly normal. No evidence of mitral valve regurgitation. No evidence of mitral valve stenosis. Tricuspid Valve: The tricuspid valve is grossly normal. Tricuspid valve regurgitation is trivial. No evidence of tricuspid stenosis. Aortic Valve: The aortic valve is tricuspid. There is mild calcification of the aortic valve. There is mild thickening of the aortic valve. Aortic valve regurgitation is not visualized. Aortic valve sclerosis is present, with no evidence of aortic valve stenosis. Pulmonic Valve: The pulmonic valve was grossly normal. Pulmonic valve regurgitation is not visualized. No evidence of pulmonic stenosis. Aorta: The aortic root and ascending aorta are structurally normal, with no evidence of dilitation. Venous: The inferior vena cava is dilated in size with greater than 50% respiratory variability, suggesting right atrial pressure of 8 mmHg. IAS/Shunts: The atrial septum is grossly normal.  LEFT VENTRICLE PLAX 2D LVIDd:         4.00 cm   Diastology LVIDs:         2.30 cm   LV e' medial:    11.30 cm/s LV PW:         0.90 cm   LV E/e' medial:  8.4 LV IVS:        1.00 cm   LV e' lateral:   9.68 cm/s LVOT diam:     1.90 cm   LV E/e' lateral:  9.8 LV SV:         65 LV SV Index:   35 LVOT Area:     2.84 cm  RIGHT VENTRICLE             IVC RV S prime:  12.80 cm/s  IVC diam: 2.10 cm LEFT ATRIUM             Index        RIGHT ATRIUM           Index LA diam:        3.70 cm 1.98 cm/m   RA Area:     15.20 cm LA Vol (A2C):   28.3 ml 15.18 ml/m  RA Volume:   39.40 ml  21.14 ml/m LA Vol (A4C):   37.3 ml 20.01 ml/m LA Biplane Vol: 34.2 ml 18.35 ml/m  AORTIC VALVE LVOT Vmax:   117.00 cm/s LVOT Vmean:  77.800 cm/s LVOT VTI:    0.229 m  AORTA Ao Root diam: 2.70 cm Ao Asc diam:  2.90 cm MITRAL VALVE                TRICUSPID VALVE MV Area (PHT): 3.60 cm     TR Peak grad:   31.4 mmHg MV Decel Time: 211 msec     TR Vmax:        280.00 cm/s MV E velocity: 94.70 cm/s MV Davielle Lingelbach velocity: 122.00 cm/s  SHUNTS MV E/Vansh Reckart ratio:  0.78         Systemic VTI:  0.23 m                             Systemic Diam: 1.90 cm Eleonore Chiquito MD Electronically signed by Eleonore Chiquito MD Signature Date/Time: 06/04/2022/4:23:53 PM    Final (Updated)    VAS US CAROTID  Result Date: 06/04/2022 Carotid Arterial Duplex Study Patient Name:  JUANITTA EARNHARDT [facility]  Date of Exam:   06/04/2022 Medical Rec #: [id number]       Accession #:    [id number] Date of Birth: 1951-06-22       Patient Gender: F Patient Age:   78 years Exam Location:  [facility] Procedure:      VAS US CAROTID Referring Phys: Cheral Cappucci POWELL JR --------------------------------------------------------------------------------  Indications:       CVA. Risk Factors:      Hypertension, hyperlipidemia, Diabetes, coronary artery                    disease, prior CVA. Limitations        Today's exam was limited due to the patient's inability or                    unwillingness to cooperate. Comparison Study:  No previous exam noted. Performing Technologist: Bobetta Lime BS, RVT  Examination Guidelines: Kattaleya Alia complete evaluation includes B-mode imaging, spectral Doppler, color Doppler, and power Doppler as needed of all accessible portions of each  vessel. Bilateral testing is considered an integral part of Ndia Sampath complete examination. Limited examinations for reoccurring indications may be performed as noted.  Right Carotid Findings: +----------+--------+--------+--------+------------------+--------+           PSV cm/sEDV cm/sStenosisPlaque DescriptionComments +----------+--------+--------+--------+------------------+--------+ CCA Prox  115     0                                          +----------+--------+--------+--------+------------------+--------+ CCA Distal91      14                                         +----------+--------+--------+--------+------------------+--------+  ICA Prox  133     18              heterogenous               +----------+--------+--------+--------+------------------+--------+ ICA Distal130     23                                         +----------+--------+--------+--------+------------------+--------+ ECA       174     0                                          +----------+--------+--------+--------+------------------+--------+ +----------+--------+-------+---------+-------------------+           PSV cm/sEDV cmsDescribe Arm Pressure (mmHG) +----------+--------+-------+---------+-------------------+ Subclavian209            Turbulent                    +----------+--------+-------+---------+-------------------+ +---------+--------+--+--------+--+---------+ VertebralPSV cm/s85EDV cm/s13Antegrade +---------+--------+--+--------+--+---------+  Left Carotid Findings: +----------+--------+--------+--------+------------------+--------+           PSV cm/sEDV cm/sStenosisPlaque DescriptionComments +----------+--------+--------+--------+------------------+--------+ CCA Prox  144     10                                         +----------+--------+--------+--------+------------------+--------+ CCA Distal136     14                                          +----------+--------+--------+--------+------------------+--------+ ICA Prox  108     13              heterogenous               +----------+--------+--------+--------+------------------+--------+ ICA Distal157     23                                         +----------+--------+--------+--------+------------------+--------+ ECA       148     0                                          +----------+--------+--------+--------+------------------+--------+ +----------+--------+--------+----------------+-------------------+           PSV cm/sEDV cm/sDescribe        Arm Pressure (mmHG) +----------+--------+--------+----------------+-------------------+ [id number]             Multiphasic, WNL                    +----------+--------+--------+----------------+-------------------+ +---------+--------+---+--------+--+---------+ VertebralPSV cm/s113EDV cm/s13Antegrade +---------+--------+---+--------+--+---------+   Summary: Right Carotid: Velocities in the right ICA are consistent with Ziyonna Christner 1-39% stenosis. Left Carotid: Velocities in the left ICA are consistent with Lashai Grosch 1-39% stenosis. Vertebrals:  Bilateral vertebral arteries demonstrate antegrade flow. Subclavians: Right subclavian artery flow was disturbed. Normal flow              hemodynamics were seen in the left subclavian artery. *See table(s) above for measurements and observations.  Electronically signed by  Antony Contras MD on 06/04/2022 at 11:53:07 AM.    Final    MR ANGIO HEAD WO CONTRAST  Result Date: 06/04/2022 CLINICAL DATA:  Follow-up examination for stroke. EXAM: MRA HEAD WITHOUT CONTRAST TECHNIQUE: Angiographic images of the Circle of Willis were acquired using MRA technique without intravenous contrast. COMPARISON:  Comparison made with previous brain MRI from earlier the same day as well as prior CTA from 05/05/2021. FINDINGS: Anterior circulation: Examination severely degraded by motion artifact, limiting assessment.  Visualized distal cervical segments of the internal carotid arteries are patent with antegrade flow. Both internal carotid arteries remain patent through the siphons without obvious stenosis. A1 segments patent. Right A1 hypoplastic. Grossly normal anterior communicating artery complex. Both ACAs grossly patent to their distal aspects on time-of-flight sequence without appreciable stenosis. M1 segments patent without obvious stenosis or occlusion. No visible or obvious proximal MCA branch occlusion. Distal MCA branches grossly perfused and symmetric on time-of-flight sequence. Posterior circulation: Vertebral arteries are code dominant and patent without visible stenosis. Both PICA appear to be grossly patent at their origins. Basilar patent to its distal aspect without appreciable stenosis. Superior cerebellar arteries grossly patent at their origins. Both PCA supplied via the basilar as well as prominent bilateral posterior communicating arteries. Both PCAs appear grossly perfused and patent to their distal aspects on time-of-flight sequence. No visible stenosis. Anatomic variants: As above. Other: Evaluation for aneurysm extremely limited on this exam. IMPRESSION: 1. Severely motion degraded exam. 2. Grossly negative intracranial MRA for large vessel occlusion. No obvious hemodynamically significant or correctable stenosis. Electronically Signed   By: Jeannine Boga M.D.   On: 06/04/2022 05:11   DG CHEST PORT 1 VIEW  Result Date: 06/03/2022 CLINICAL DATA:  Shortness of breath. EXAM: PORTABLE CHEST 1 VIEW COMPARISON:  Chest x-ray 05/30/2022. FINDINGS: Mild streaky left basilar opacities. No confluent consolidation. No visible pleural effusions or pneumothorax. Similar cardiomediastinal silhouette, which is mildly enlarged. Right IJ approach central venous catheter with the tip projecting at the right atrium. IMPRESSION: Mild streaky left basilar opacities, favor atelectasis. Electronically Signed   By:  Margaretha Sheffield M.D.   On: 06/03/2022 14:15   MR BRAIN WO CONTRAST  Result Date: 06/03/2022 CLINICAL DATA:  Confusion.  History of end-stage renal disease EXAM: MRI HEAD WITHOUT CONTRAST TECHNIQUE: Multiplanar, multiecho pulse sequences of the brain and surrounding structures were obtained without intravenous contrast. COMPARISON:  Head CT from 3 days ago. FINDINGS: Brain: Punctate focus of acute ischemia in the left corona radiata. Small acute cortical infarct in the right parietal lobe. Remote perforator infarct at the right corona radiata with wallerian degeneration seen descending the brainstem. Confluent ischemic gliosis in the cerebral white matter. No acute hemorrhage, hydrocephalus, or collection. Vascular: Preserved flow voids Skull and upper cervical spine: No focal marrow lesion Sinuses/Orbits: Bilateral cataract resection.  No acute finding Other: Intermittently pronounced motion artifact IMPRESSION: 1. 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 2. Advanced chronic small vessel ischemia. 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 3. Motion degraded. Electronically Signed   By: Jorje Guild M.D.   On: 06/03/2022 03:58        Scheduled Meds:  (feeding supplement) PROSource Plus  30 mL Oral BID BM   acetaminophen  1,000 mg Oral Q8H   vitamin C  1,000 mg Oral Daily   aspirin EC  81 mg Oral Daily   capsaicin   Topical BID   carvedilol  3.125 mg Oral BID WC   Chlorhexidine Gluconate Cloth  6 each Topical Q0600   clopidogrel  75 mg Oral Daily   darbepoetin (ARANESP) injection - DIALYSIS  200 mcg Intravenous Q Fri-HD   docusate sodium  100 mg Oral Daily   feeding supplement (NEPRO CARB STEADY)  237 mL Oral BID BM   heparin injection (subcutaneous)  5,000 Units Subcutaneous Q8H   hydrocortisone   Rectal BID   insulin aspart  0-9 Units Subcutaneous TID WC   levETIRAcetam  500 mg Oral QHS    losartan  100 mg Oral Daily   methylphenidate  5 mg Oral BID WC   mometasone-formoterol  2 puff Inhalation BID   nutrition supplement (JUVEN)  1 packet Oral BID BM   pantoprazole  40 mg Oral Daily   rosuvastatin  10 mg Oral Daily   sevelamer carbonate  0.8 g Oral TID WC   [START ON 06/10/2022] thiamine  100 mg Oral Daily   Continuous Infusions:  magnesium sulfate bolus IVPB     piperacillin-tazobactam (ZOSYN)  IV 2.25 g (06/04/22 1443)   [START ON 06/05/2022] thiamine (VITAMIN B1) injection       LOS: 25 days    Time spent: over 30 min    Fayrene Helper, MD [facility]   To contact the attending provider between 7A-7P or the covering provider during after hours 7P-7A, please log into the web site [email or web address] and access using universal [place] password for that web site. If you do not have the password, please call the hospital operator.  06/04/2022, 5:31 PM

## 2022-06-05 DIAGNOSIS — D72829 Elevated white blood cell count, unspecified: Secondary | ICD-10-CM | POA: Diagnosis not present

## 2022-06-05 LAB — CBC WITH DIFFERENTIAL/PLATELET
Abs Immature Granulocytes: 0.11 10*3/uL — ABNORMAL HIGH (ref 0.00–0.07)
Basophils Absolute: 0.1 10*3/uL (ref 0.0–0.1)
Basophils Relative: 1 %
Eosinophils Absolute: 0.4 10*3/uL (ref 0.0–0.5)
Eosinophils Relative: 3 %
HCT: 24.6 % — ABNORMAL LOW (ref 36.0–46.0)
Hemoglobin: 8.7 g/dL — ABNORMAL LOW (ref 12.0–15.0)
Immature Granulocytes: 1 %
Lymphocytes Relative: 30 %
Lymphs Abs: 4.1 10*3/uL — ABNORMAL HIGH (ref 0.7–4.0)
MCH: 31.5 pg (ref 26.0–34.0)
MCHC: 35.4 g/dL (ref 30.0–36.0)
MCV: 89.1 fL (ref 80.0–100.0)
Monocytes Absolute: 2.4 10*3/uL — ABNORMAL HIGH (ref 0.1–1.0)
Monocytes Relative: 17 %
Neutro Abs: 6.9 10*3/uL (ref 1.7–7.7)
Neutrophils Relative %: 48 %
Platelets: 341 10*3/uL (ref 150–400)
RBC: 2.76 MIL/uL — ABNORMAL LOW (ref 3.87–5.11)
RDW: 22.3 % — ABNORMAL HIGH (ref 11.5–15.5)
WBC: 14 10*3/uL — ABNORMAL HIGH (ref 4.0–10.5)
nRBC: 0.6 % — ABNORMAL HIGH (ref 0.0–0.2)

## 2022-06-05 LAB — PHOSPHORUS: Phosphorus: 5.2 mg/dL — ABNORMAL HIGH (ref 2.5–4.6)

## 2022-06-05 LAB — COMPREHENSIVE METABOLIC PANEL
ALT: 14 U/L (ref 0–44)
AST: 13 U/L — ABNORMAL LOW (ref 15–41)
Albumin: 2.3 g/dL — ABNORMAL LOW (ref 3.5–5.0)
Alkaline Phosphatase: 59 U/L (ref 38–126)
Anion gap: 15 (ref 5–15)
BUN: 42 mg/dL — ABNORMAL HIGH (ref 8–23)
CO2: 25 mmol/L (ref 22–32)
Calcium: 8.8 mg/dL — ABNORMAL LOW (ref 8.9–10.3)
Chloride: 95 mmol/L — ABNORMAL LOW (ref 98–111)
Creatinine, Ser: 5.8 mg/dL — ABNORMAL HIGH (ref 0.44–1.00)
GFR, Estimated: 7 mL/min — ABNORMAL LOW (ref >60)
Glucose, Bld: 234 mg/dL — ABNORMAL HIGH (ref 70–99)
Potassium: 3.8 mmol/L (ref 3.5–5.1)
Sodium: 135 mmol/L (ref 135–145)
Total Bilirubin: 0.5 mg/dL (ref 0.3–1.2)
Total Protein: 5.6 g/dL — ABNORMAL LOW (ref 6.5–8.1)

## 2022-06-05 LAB — LIPID PANEL
Cholesterol: 76 mg/dL (ref 0–200)
HDL: 43 mg/dL (ref >40)
LDL Cholesterol: 24 mg/dL (ref 0–99)
Total CHOL/HDL Ratio: 1.8 RATIO
Triglycerides: 43 mg/dL (ref <150)
VLDL: 9 mg/dL (ref 0–40)

## 2022-06-05 LAB — MAGNESIUM: Magnesium: 1.5 mg/dL — ABNORMAL LOW (ref 1.7–2.4)

## 2022-06-05 LAB — GLUCOSE, CAPILLARY
Glucose-Capillary: 125 mg/dL — ABNORMAL HIGH (ref 70–99)
Glucose-Capillary: 196 mg/dL — ABNORMAL HIGH (ref 70–99)
Glucose-Capillary: 144 mg/dL — ABNORMAL HIGH (ref 70–99)
Glucose-Capillary: 189 mg/dL — ABNORMAL HIGH (ref 70–99)

## 2022-06-05 MED ORDER — CHLORHEXIDINE GLUCONATE CLOTH 2 % EX PADS: 6.0000 | MEDICATED_PAD | Freq: Every day | CUTANEOUS | Status: DC

## 2022-06-05 MED ORDER — BISACODYL 10 MG RE SUPP
10.0000 mg | Freq: Every day | RECTAL | Status: DC | PRN
  Administered 2022-06-05: 10 mg via RECTAL
  Filled 2022-06-05: qty 1

## 2022-06-05 MED ORDER — TICAGRELOR 90 MG PO TABS
90.0000 mg | ORAL_TABLET | Freq: Two times a day (BID) | ORAL | Status: DC
  Administered 2022-06-06 – 2022-06-10 (×10): 90 mg via ORAL
  Filled 2022-06-05 (×10): qty 1

## 2022-06-05 NOTE — Progress Notes (Signed)
IVT to assess CL; in pt care getting washed up.

## 2022-06-05 NOTE — Progress Notes (Signed)
PROGRESS NOTE    LEZLIE RITCHEY  WEX:[id number] DOB: 01-16-1951 DOA: 05/10/2022 PCP: [name], MD  Chief Complaint  Patient presents with   Cellulitis    Brief Narrative:  TWYLAH BENNETTS is Nehan Flaum 71 y.o. female with Kathleen Tamm history of ESRD on HD, CVA with left hemiplegia, seizures, diabetes, hypertension, hyperlipidemia, chronic systolic heart failure, PAD, GI bleed. Patient presented secondary to worsening right foot infection, found to have gangrene. Empiric antibiotics initiated and patient underwent Bocephus Cali right BKA. During hospitalization, course has been complicated by altered mental status, in addition to presumed infection of unknown source. Currently resumed on antibiotics.    Assessment & Plan:   Principal Problem:   Leukocytosis Active Problems:   Gangrene of right foot (HCC)   PAD (peripheral artery disease) (HCC)   Encephalopathy   Shortness of breath   Acute stroke due to ischemia [facility])   Chest pain   History of CVA (cerebrovascular accident)   Anemia in chronic kidney disease   ESRD on dialysis ([place])   Type 2 diabetes mellitus (HCC)   Obesity (BMI 30-39.9)   Hypertensive disorder   Dyslipidemia (high LDL; low HDL)   CAD (coronary artery disease), native coronary artery   Asthma   Diabetic retinopathy (HCC)   Basal ganglia infarction (HCC)   Foot infection   Below-knee amputation of right lower extremity (HCC)   Seizures (HCC)   Assessment and Plan: * Leukocytosis Afebrile, currently on vanc/zosyn empirically -> monitoring off abx at this time Unclear cause at this time -> but gradually improving CXR 8/7 without acute findings Blood cx from 8/5 with bacillus species (suspected contaminant) Will repeat blood cx 8/10 NGx2   Gangrene of right foot (HCC) Empiric Vancomycin and Cefepime initiated on admission. Vascular and orthopedic surgery consulted. Patient underwent right BKA on 7/26 with subsequent discontinuation of antibiotics. PT/OT consulted with  recommendations for SNF, however family is hoping for possible discharge to CIR. Patient with some pain of her leg but manageable. Wound vacuum removed on 8/6 for evaluation for infection (per Dr. Sharol Given on 8/7, no signs of infection).  PAD (peripheral artery disease) (HCC) plavix  Encephalopathy Initial workup without specific/reversible etiology identified. Possibly related to medication effect. Patient with slow but positive recovery of mental status function. On 8/8, patient found to be lethargic with Dawnielle Christiana blood sugar of 46. D50 given. Persistent stupor. Possible she could have had Cambridge Deleo seizure from hypoglycemia with resultant post-ictal phase. Cefepime recently started which could also have contributed, but episode seems to have coincided with hypoglycemic episode. -ABG without hypercarbia -Ammonia 11 on 04/2022 -vitamin b12 wnl, folate wnl -EEG without seizures or epileptiform discharges -CT head without acute process -cefepime d/c'd -high dose thiamine -MRI with small acute infarcts in the L corona radiata and R parietal cortex -> per neurology, these don't explain encephalopathy, recommending LTM EEG for recurrent lethargy Improved mental status today  Acute stroke due to ischemia [facility]) MRI with small acute infarcts in L corona radiata and R parietal cortex Neurology c/s - likely due to small vessel disease Echo with EF 60-65%, no RWMA, grade 1 diastolic dysfunction , carotid US with 1-39% stenosis bilaterally, right subclavian flow disturbed, MRA brain without contrast severly motion degraded, grossly negative intracranial MRA for LVO A1c 4.6, lipid panel PT/OT/SLP Neurology recommending DAPT with aspirin and brilintax30 days (will need to discuss with vascular regarding whether long term need for DAPT)   Shortness of breath Currently on RA CXR today favors atelectasis Per discussion with  husband, this is chronic complaint, w/u further if recurrent or worsening - no complaints  today  History of CVA (cerebrovascular accident) Resultant L hemiplegia, more notable weakness today per discussion with husband? ? Related to episode of hypoglycemia/AMS -> MRI as above  Chest pain Reproducible per previous provider  ESRD on dialysis [facility]) Renal c/s, appreciate assistance  Anemia in chronic kidney disease trend  Type 2 diabetes mellitus ([place]) SSI, hold basal/bolus with episode of hypoglycemia causing confusion     DVT prophylaxis: heparin Code Status: full Family Communication: husband Disposition:   Status is: Inpatient Remains inpatient appropriate because: pending further improvement    Consultants:  renal  Procedures:  Right BKA (7/26)  Antimicrobials:  Anti-infectives (From admission, onward)    Start     Dose/Rate Route Frequency Ordered Stop   05/31/22 1430  piperacillin-tazobactam (ZOSYN) IVPB 2.25 g        2.25 g 100 mL/hr over 30 Minutes Intravenous Every 8 hours 05/31/22 1354 06/04/22 2143   05/30/22 1200  vancomycin (VANCOREADY) IVPB 750 mg/150 mL  Status:  Discontinued        750 mg 150 mL/hr over 60 Minutes Intravenous Every M-W-F (Hemodialysis) 05/28/22 1754 06/04/22 1702   05/28/22 1845  vancomycin (VANCOREADY) IVPB 1250 mg/250 mL        1,250 mg 166.7 mL/hr over 90 Minutes Intravenous  Once 05/28/22 1754 05/28/22 2108   05/28/22 1845  ceFEPIme (MAXIPIME) 1 g in sodium chloride 0.9 % 100 mL IVPB  Status:  Discontinued        1 g 200 mL/hr over 30 Minutes Intravenous Every 24 hours 05/28/22 1754 05/31/22 1241   05/18/22 1300  ceFAZolin (ANCEF) IVPB 2g/100 mL premix  Status:  Discontinued        2 g 200 mL/hr over 30 Minutes Intravenous Every 8 hours 05/18/22 1212 05/18/22 1233   05/14/22 1130  vancomycin (VANCOCIN) IVPB 1000 mg/200 mL premix        1,000 mg 200 mL/hr over 60 Minutes Intravenous  Once 05/14/22 1038 05/14/22 1429   05/11/22 1900  ceFEPIme (MAXIPIME) 1 g in sodium chloride 0.9 % 100 mL IVPB  Status:  Discontinued         1 g 200 mL/hr over 30 Minutes Intravenous Every 24 hours 05/10/22 1715 05/18/22 1633   05/11/22 1200  vancomycin (VANCOCIN) IVPB 1000 mg/200 mL premix  Status:  Discontinued        1,000 mg 200 mL/hr over 60 Minutes Intravenous Every M-W-F (Hemodialysis) 05/10/22 1715 05/19/22 0925   05/10/22 1730  vancomycin (VANCOREADY) IVPB 1750 mg/350 mL        1,750 mg 175 mL/hr over 120 Minutes Intravenous  Once 05/10/22 1715 05/10/22 2151   05/10/22 1730  ceFEPIme (MAXIPIME) 2 g in sodium chloride 0.9 % 100 mL IVPB        2 g 200 mL/hr over 30 Minutes Intravenous  Once 05/10/22 1715 05/10/22 1840       Subjective: Husband at bedside C/o pain in her leg with amputation More alert, some jokes  Objective: Vitals:   06/05/22 0026 06/05/22 0403 06/05/22 0832 06/05/22 1326  BP:  (!) 138/54 (!) 138/59 131/77  Pulse:   67 69  Resp:   17 18  Temp: 98.8 F (37.1 C) 98.2 F (36.8 C) 98 F (36.7 C) 97.8 F (36.6 C)  TempSrc: Oral Oral Oral Oral  SpO2:   99% 100%  Weight:      Height:  No intake or output data in the 24 hours ending 06/05/22 1403  Filed Weights   06/03/22 0717 06/03/22 0729 06/03/22 1139  Weight: 80.9 kg 80.9 kg 78.9 kg    Examination:  General: No acute distress. Cardiovascular: RRR Lungs: unlabored Abdomen: Soft, nontender, nondistended  Neurological: L sided weakness, again, more alert and interactive today Extremities: No clubbing or cyanosis. No edema.  Data Reviewed: I have personally reviewed following labs and imaging studies  CBC: Recent Labs  Lab 06/01/22 1309 06/02/22 0702 06/03/22 0138 06/04/22 0132 06/05/22 0112  WBC 21.8* 16.4* 16.1* 15.2* 14.0*  NEUTROABS 15.2* 11.5* 10.5* 9.4* 6.9  HGB 8.7* 9.0* 9.2* 8.9* 8.7*  HCT 24.8* 25.6* 26.5* 25.2* 24.6*  MCV 88.9 88.3 88.9 90.0 89.1  PLT 327 339 329 341 166    Basic Metabolic Panel: Recent Labs  Lab 05/30/22 1510 05/31/22 1049 06/01/22 1309 06/02/22 0702 06/03/22 0138  06/04/22 0132 06/05/22 0112  NA 127*   < > 128* 131* 128* 133* 135  K 3.8   < > 4.4 3.4* 3.6 3.6 3.8  CL 89*  --  92* 92* 92* 94* 95*  CO2 23  --  20* '24 23 26 25  '$ GLUCOSE 151*  --  162* 82 116* 178* 234*  BUN 74*  --  81* 26* 41* 25* 42*  CREATININE 8.67*  --  9.67* 4.58* 5.79* 3.81* 5.80*  CALCIUM 9.0  --  8.7* 8.4* 8.4* 8.4* 8.8*  MG  --   --   --  1.6* 1.6*  --  1.5*  PHOS 5.3*  --   --  3.8 4.8*  --  5.2*   < > = values in this interval not displayed.    GFR: Estimated Creatinine Clearance: 9 mL/min (Jaleyah Longhi) (by C-G formula based on SCr of 5.8 mg/dL (H)).  Liver Function Tests: Recent Labs  Lab 06/01/22 1309 06/02/22 0702 06/03/22 0138 06/04/22 0132 06/05/22 0112  AST 14* 15 14* 15 13*  ALT '14 15 15 14 14  '$ ALKPHOS 55 58 52 64 59  BILITOT 0.3 0.9 0.8 0.7 0.5  PROT 5.3* 6.1* 5.9* 5.6* 5.6*  ALBUMIN 2.2* 2.3* 2.4* 2.2* 2.3*    CBG: Recent Labs  Lab 06/04/22 1106 06/04/22 1607 06/04/22 2023 06/05/22 0830 06/05/22 1129  GLUCAP 174* 186* 138* 125* 196*     Recent Results (from the past 240 hour(s))  Culture, blood (Routine X 2) w Reflex to ID Panel     Status: None   Collection Time: 05/28/22  6:10 PM   Specimen: BLOOD LEFT HAND  Result Value Ref Range Status   Specimen Description BLOOD LEFT HAND  Final   Special Requests   Final    BOTTLES DRAWN AEROBIC AND ANAEROBIC Blood Culture results may not be optimal due to an inadequate volume of blood received in culture bottles   Culture   Final    NO GROWTH 5 DAYS Performed at [facility], [place] [address] [place], [place] [address]    Report Status 06/02/2022 FINAL  Final  Culture, blood (Routine X 2) w Reflex to ID Panel     Status: Abnormal   Collection Time: 05/28/22  6:10 PM   Specimen: BLOOD LEFT HAND  Result Value Ref Range Status   Specimen Description BLOOD LEFT HAND  Final   Special Requests   Final    BOTTLES DRAWN AEROBIC AND ANAEROBIC Blood Culture adequate volume   Culture  Setup Time    Final  GRAM POSITIVE RODS AEROBIC BOTTLE ONLY CRITICAL RESULT CALLED TO, READ BACK BY AND VERIFIED WITH: PHARMD BEN.M AT 0919 ON 05/28/2022 BY T.SAAD.    Culture (Nagi Furio)  Final    BACILLUS SPECIES Standardized susceptibility testing for this organism is not available. Performed at [facility], [place] [address] [place], [place] [address]    Report Status 05/30/2022 FINAL  Final  Culture, blood (Routine X 2) w Reflex to ID Panel     Status: None (Preliminary result)   Collection Time: 06/02/22  7:02 AM   Specimen: BLOOD LEFT HAND  Result Value Ref Range Status   Specimen Description BLOOD LEFT HAND  Final   Special Requests   Final    BOTTLES DRAWN AEROBIC AND ANAEROBIC Blood Culture adequate volume   Culture   Final    NO GROWTH 3 DAYS Performed at [facility], [place] [address], [place], [place] [address]    Report Status PENDING  Incomplete  Culture, blood (Routine X 2) w Reflex to ID Panel     Status: None (Preliminary result)   Collection Time: 06/02/22  7:08 AM   Specimen: BLOOD LEFT HAND  Result Value Ref Range Status   Specimen Description BLOOD LEFT HAND  Final   Special Requests   Final    BOTTLES DRAWN AEROBIC AND ANAEROBIC Blood Culture results may not be optimal due to an inadequate volume of blood received in culture bottles   Culture   Final    NO GROWTH 3 DAYS Performed at [facility], [place] [address] [place], [place] [address]    Report Status PENDING  Incomplete         Radiology Studies: ECHOCARDIOGRAM COMPLETE  Result Date: 06/04/2022    ECHOCARDIOGRAM REPORT   Patient Name:   CHAD TIZNADO [facility] Date of Exam: 06/04/2022 Medical Rec #:  [id number]      Height:       65.0 in Accession #:    [id number]     Weight:       173.9 lb Date of Birth:  12/25/1950      BSA:          1.864 m Patient Age:    73 years       BP:           150/62 mmHg Patient Gender: F              HR:           72 bpm. Exam Location:  Inpatient Procedure: 2D Echo Indications:      stroke  History:         Patient has prior history of Echocardiogram examinations, most                  recent 03/11/2021. CAD, end stage renal disease,                  Signs/Symptoms:Shortness of Breath; Risk Factors:Hypertension,                  Dyslipidemia and Diabetes.  Sonographer:     Johny Chess RDCS Referring Phys:  [place] Diagnosing Phys: Eleonore Chiquito MD  Sonographer Comments: Image acquisition challenging due to uncooperative patient. IMPRESSIONS  1. Left ventricular ejection fraction, by estimation, is 60 to 65%. The left ventricle has normal function. The left ventricle has no regional wall motion abnormalities. Left ventricular diastolic parameters are consistent with Grade I diastolic  dysfunction (impaired relaxation).  2. Right ventricular systolic function is normal. The right ventricular size is normal. There is mildly elevated pulmonary artery systolic pressure. The estimated right ventricular systolic pressure is 01.0 mmHg.  3. The mitral valve is grossly normal. No evidence of mitral valve regurgitation. No evidence of mitral stenosis.  4. The aortic valve is tricuspid. There is mild calcification of the aortic valve. There is mild thickening of the aortic valve. Aortic valve regurgitation is not visualized. Aortic valve sclerosis is present, with no evidence of aortic valve stenosis.  5. The inferior vena cava is dilated in size with >50% respiratory variability, suggesting right atrial pressure of 8 mmHg. Conclusion(s)/Recommendation(s): No intracardiac source of embolism detected on this transthoracic study. Consider Akeyla Molden transesophageal echocardiogram to exclude cardiac source of embolism if clinically indicated. FINDINGS  Left Ventricle: Left ventricular ejection fraction, by estimation, is 60 to 65%. The left ventricle has normal function. The left ventricle has no regional wall motion abnormalities. The left ventricular internal cavity size was normal in size.  There is  no left ventricular hypertrophy. Left ventricular diastolic parameters are consistent with Grade I diastolic dysfunction (impaired relaxation). Right Ventricle: The right ventricular size is normal. No increase in right ventricular wall thickness. Right ventricular systolic function is normal. There is mildly elevated pulmonary artery systolic pressure. The tricuspid regurgitant velocity is 2.80  m/s, and with an assumed right atrial pressure of 8 mmHg, the estimated right ventricular systolic pressure is 93.2 mmHg. Left Atrium: Left atrial size was normal in size. Right Atrium: Right atrial size was normal in size. Pericardium: Trivial pericardial effusion is present. Mitral Valve: The mitral valve is grossly normal. No evidence of mitral valve regurgitation. No evidence of mitral valve stenosis. Tricuspid Valve: The tricuspid valve is grossly normal. Tricuspid valve regurgitation is trivial. No evidence of tricuspid stenosis. Aortic Valve: The aortic valve is tricuspid. There is mild calcification of the aortic valve. There is mild thickening of the aortic valve. Aortic valve regurgitation is not visualized. Aortic valve sclerosis is present, with no evidence of aortic valve stenosis. Pulmonic Valve: The pulmonic valve was grossly normal. Pulmonic valve regurgitation is not visualized. No evidence of pulmonic stenosis. Aorta: The aortic root and ascending aorta are structurally normal, with no evidence of dilitation. Venous: The inferior vena cava is dilated in size with greater than 50% respiratory variability, suggesting right atrial pressure of 8 mmHg. IAS/Shunts: The atrial septum is grossly normal.  LEFT VENTRICLE PLAX 2D LVIDd:         4.00 cm   Diastology LVIDs:         2.30 cm   LV e' medial:    11.30 cm/s LV PW:         0.90 cm   LV E/e' medial:  8.4 LV IVS:        1.00 cm   LV e' lateral:   9.68 cm/s LVOT diam:     1.90 cm   LV E/e' lateral: 9.8 LV SV:         65 LV SV Index:   35 LVOT Area:      2.84 cm  RIGHT VENTRICLE             IVC RV S prime:     12.80 cm/s  IVC diam: 2.10 cm LEFT ATRIUM             Index        RIGHT ATRIUM  Index LA diam:        3.70 cm 1.98 cm/m   RA Area:     15.20 cm LA Vol (A2C):   28.3 ml 15.18 ml/m  RA Volume:   39.40 ml  21.14 ml/m LA Vol (A4C):   37.3 ml 20.01 ml/m LA Biplane Vol: 34.2 ml 18.35 ml/m  AORTIC VALVE LVOT Vmax:   117.00 cm/s LVOT Vmean:  77.800 cm/s LVOT VTI:    0.229 m  AORTA Ao Root diam: 2.70 cm Ao Asc diam:  2.90 cm MITRAL VALVE                TRICUSPID VALVE MV Area (PHT): 3.60 cm     TR Peak grad:   31.4 mmHg MV Decel Time: 211 msec     TR Vmax:        280.00 cm/s MV E velocity: 94.70 cm/s MV Letanya Froh velocity: 122.00 cm/s  SHUNTS MV E/Tamel Abel ratio:  0.78         Systemic VTI:  0.23 m                             Systemic Diam: 1.90 cm Eleonore Chiquito MD Electronically signed by Eleonore Chiquito MD Signature Date/Time: 06/04/2022/4:23:53 PM    Final (Updated)    VAS US CAROTID  Result Date: 06/04/2022 Carotid Arterial Duplex Study Patient Name:  LINDSEE LABARRE [facility]  Date of Exam:   06/04/2022 Medical Rec #: [id number]       Accession #:    [id number] Date of Birth: 1951-01-31       Patient Gender: F Patient Age:   10 years Exam Location:  [facility] Procedure:      VAS US CAROTID Referring Phys: Daphna Lafuente POWELL JR --------------------------------------------------------------------------------  Indications:       CVA. Risk Factors:      Hypertension, hyperlipidemia, Diabetes, coronary artery                    disease, prior CVA. Limitations        Today's exam was limited due to the patient's inability or                    unwillingness to cooperate. Comparison Study:  No previous exam noted. Performing Technologist: Bobetta Lime BS, RVT  Examination Guidelines: Renesmay Nesbitt complete evaluation includes B-mode imaging, spectral Doppler, color Doppler, and power Doppler as needed of all accessible portions of each vessel. Bilateral testing is considered an integral  part of Mikyle Sox complete examination. Limited examinations for reoccurring indications may be performed as noted.  Right Carotid Findings: +----------+--------+--------+--------+------------------+--------+           PSV cm/sEDV cm/sStenosisPlaque DescriptionComments +----------+--------+--------+--------+------------------+--------+ CCA Prox  115     0                                          +----------+--------+--------+--------+------------------+--------+ CCA Distal91      14                                         +----------+--------+--------+--------+------------------+--------+ ICA Prox  133     18              heterogenous               +----------+--------+--------+--------+------------------+--------+  ICA Distal130     23                                         +----------+--------+--------+--------+------------------+--------+ ECA       174     0                                          +----------+--------+--------+--------+------------------+--------+ +----------+--------+-------+---------+-------------------+           PSV cm/sEDV cmsDescribe Arm Pressure (mmHG) +----------+--------+-------+---------+-------------------+ Subclavian209            Turbulent                    +----------+--------+-------+---------+-------------------+ +---------+--------+--+--------+--+---------+ VertebralPSV cm/s85EDV cm/s13Antegrade +---------+--------+--+--------+--+---------+  Left Carotid Findings: +----------+--------+--------+--------+------------------+--------+           PSV cm/sEDV cm/sStenosisPlaque DescriptionComments +----------+--------+--------+--------+------------------+--------+ CCA Prox  144     10                                         +----------+--------+--------+--------+------------------+--------+ CCA Distal136     14                                          +----------+--------+--------+--------+------------------+--------+ ICA Prox  108     13              heterogenous               +----------+--------+--------+--------+------------------+--------+ ICA Distal157     23                                         +----------+--------+--------+--------+------------------+--------+ ECA       148     0                                          +----------+--------+--------+--------+------------------+--------+ +----------+--------+--------+----------------+-------------------+           PSV cm/sEDV cm/sDescribe        Arm Pressure (mmHG) +----------+--------+--------+----------------+-------------------+ [id number]             Multiphasic, WNL                    +----------+--------+--------+----------------+-------------------+ +---------+--------+---+--------+--+---------+ VertebralPSV cm/s113EDV cm/s13Antegrade +---------+--------+---+--------+--+---------+   Summary: Right Carotid: Velocities in the right ICA are consistent with Merisa Julio 1-39% stenosis. Left Carotid: Velocities in the left ICA are consistent with Analea Muller 1-39% stenosis. Vertebrals:  Bilateral vertebral arteries demonstrate antegrade flow. Subclavians: Right subclavian artery flow was disturbed. Normal flow              hemodynamics were seen in the left subclavian artery. *See table(s) above for measurements and observations.  Electronically signed by Antony Contras MD on 06/04/2022 at 11:53:07 AM.    Final    MR ANGIO HEAD WO CONTRAST  Result Date: 06/04/2022 CLINICAL DATA:  Follow-up examination for stroke. EXAM: MRA HEAD WITHOUT CONTRAST TECHNIQUE: Angiographic  images of the Circle of Willis were acquired using MRA technique without intravenous contrast. COMPARISON:  Comparison made with previous brain MRI from earlier the same day as well as prior CTA from 05/05/2021. FINDINGS: Anterior circulation: Examination severely degraded by motion artifact, limiting assessment.  Visualized distal cervical segments of the internal carotid arteries are patent with antegrade flow. Both internal carotid arteries remain patent through the siphons without obvious stenosis. A1 segments patent. Right A1 hypoplastic. Grossly normal anterior communicating artery complex. Both ACAs grossly patent to their distal aspects on time-of-flight sequence without appreciable stenosis. M1 segments patent without obvious stenosis or occlusion. No visible or obvious proximal MCA branch occlusion. Distal MCA branches grossly perfused and symmetric on time-of-flight sequence. Posterior circulation: Vertebral arteries are code dominant and patent without visible stenosis. Both PICA appear to be grossly patent at their origins. Basilar patent to its distal aspect without appreciable stenosis. Superior cerebellar arteries grossly patent at their origins. Both PCA supplied via the basilar as well as prominent bilateral posterior communicating arteries. Both PCAs appear grossly perfused and patent to their distal aspects on time-of-flight sequence. No visible stenosis. Anatomic variants: As above. Other: Evaluation for aneurysm extremely limited on this exam. IMPRESSION: 1. Severely motion degraded exam. 2. Grossly negative intracranial MRA for large vessel occlusion. No obvious hemodynamically significant or correctable stenosis. Electronically Signed   By: Jeannine Boga M.D.   On: 06/04/2022 05:11   DG CHEST PORT 1 VIEW  Result Date: 06/03/2022 CLINICAL DATA:  Shortness of breath. EXAM: PORTABLE CHEST 1 VIEW COMPARISON:  Chest x-ray 05/30/2022. FINDINGS: Mild streaky left basilar opacities. No confluent consolidation. No visible pleural effusions or pneumothorax. Similar cardiomediastinal silhouette, which is mildly enlarged. Right IJ approach central venous catheter with the tip projecting at the right atrium. IMPRESSION: Mild streaky left basilar opacities, favor atelectasis. Electronically Signed   By:  Margaretha Sheffield M.D.   On: 06/03/2022 14:15        Scheduled Meds:  (feeding supplement) PROSource Plus  30 mL Oral BID BM   acetaminophen  1,000 mg Oral Q8H   vitamin C  1,000 mg Oral Daily   aspirin EC  81 mg Oral Daily   capsaicin   Topical BID   carvedilol  3.125 mg Oral BID WC   Chlorhexidine Gluconate Cloth  6 each Topical Q0600   darbepoetin (ARANESP) injection - DIALYSIS  200 mcg Intravenous Q Fri-HD   docusate sodium  100 mg Oral Daily   feeding supplement (NEPRO CARB STEADY)  237 mL Oral BID BM   heparin injection (subcutaneous)  5,000 Units Subcutaneous Q8H   hydrocortisone   Rectal BID   insulin aspart  0-9 Units Subcutaneous TID WC   levETIRAcetam  500 mg Oral QHS   losartan  100 mg Oral Daily   methylphenidate  5 mg Oral BID WC   mometasone-formoterol  2 puff Inhalation BID   nutrition supplement (JUVEN)  1 packet Oral BID BM   pantoprazole  40 mg Oral Daily   rosuvastatin  10 mg Oral Daily   sevelamer carbonate  0.8 g Oral TID WC   [START ON 06/10/2022] thiamine  100 mg Oral Daily   [START ON 06/06/2022] ticagrelor  90 mg Oral BID   Continuous Infusions:  magnesium sulfate bolus IVPB     thiamine (VITAMIN B1) injection 250 mg (06/05/22 1035)     LOS: 26 days    Time spent: over 30 min    Fayrene Helper, MD [facility]  To contact the attending provider between 7A-7P or the covering provider during after hours 7P-7A, please log into the web site [email or web address] and access using universal [place] password for that web site. If you do not have the password, please call the hospital operator.  06/05/2022, 2:03 PM

## 2022-06-05 NOTE — Progress Notes (Signed)
[facility] Progress Note   Subjective:   Pt seen in room, husband at bedside. Denies SOB at present but husband reports she felt like she could not breath deeply overnight. Had a CPAP study 2 years ago that was reportedly negative. Denies CP, dizziness, and nausea.   Objective Vitals:   06/04/22 2050 06/04/22 2100 06/05/22 0026 06/05/22 0403  BP:  (!) 141/62  (!) 138/54  Pulse:  73    Resp:      Temp:  98.8 F (37.1 C) 98.8 F (37.1 C) 98.2 F (36.8 C)  TempSrc:  Oral Oral Oral  SpO2: 100%     Weight:      Height:       Physical Exam General: Alert female in NAD Heart: RRR, no murmurs, rubs or gallops Lungs: CTA bilaterally without wheezing, rhonchi or rales Abdomen: Soft, non-distended, +BS Extremities: No edema b/l lower extremities Dialysis Access: [facility]  Additional Objective Labs: Basic Metabolic Panel: Recent Labs  Lab 06/02/22 0702 06/03/22 0138 06/04/22 0132 06/05/22 0112  NA 131* 128* 133* 135  K 3.4* 3.6 3.6 3.8  CL 92* 92* 94* 95*  CO2 '24 23 26 25  '$ GLUCOSE 82 116* 178* 234*  BUN 26* 41* 25* 42*  CREATININE 4.58* 5.79* 3.81* 5.80*  CALCIUM 8.4* 8.4* 8.4* 8.8*  PHOS 3.8 4.8*  --  5.2*   Liver Function Tests: Recent Labs  Lab 06/03/22 0138 06/04/22 0132 06/05/22 0112  AST 14* 15 13*  ALT '15 14 14  '$ ALKPHOS 52 64 59  BILITOT 0.8 0.7 0.5  PROT 5.9* 5.6* 5.6*  ALBUMIN 2.4* 2.2* 2.3*   No results for input(s): "LIPASE", "AMYLASE" in the last 168 hours. CBC: Recent Labs  Lab 06/01/22 1309 06/02/22 0702 06/03/22 0138 06/04/22 0132 06/05/22 0112  WBC 21.8* 16.4* 16.1* 15.2* 14.0*  NEUTROABS 15.2* 11.5* 10.5* 9.4* 6.9  HGB 8.7* 9.0* 9.2* 8.9* 8.7*  HCT 24.8* 25.6* 26.5* 25.2* 24.6*  MCV 88.9 88.3 88.9 90.0 89.1  PLT 327 339 329 341 341   Blood Culture    Component Value Date/Time   SDES BLOOD LEFT HAND 06/02/2022 0708   SPECREQUEST  06/02/2022 0708    BOTTLES DRAWN AEROBIC AND ANAEROBIC Blood Culture results may not be  optimal due to an inadequate volume of blood received in culture bottles   CULT  06/02/2022 0708    NO GROWTH 2 DAYS Performed at [facility], [place] [address] [place], [place] [address]    REPTSTATUS PENDING 06/02/2022 0708    Cardiac Enzymes: No results for input(s): "CKTOTAL", "CKMB", "CKMBINDEX", "TROPONINI" in the last 168 hours. CBG: Recent Labs  Lab 06/03/22 2207 06/04/22 0836 06/04/22 1106 06/04/22 1607 06/04/22 2023  GLUCAP 136* 140* 174* 186* 138*   Iron Studies: No results for input(s): "IRON", "TIBC", "TRANSFERRIN", "FERRITIN" in the last 72 hours. '@lablastinr3'$ @ Studies/Results: ECHOCARDIOGRAM COMPLETE  Result Date: 06/04/2022    ECHOCARDIOGRAM REPORT   Patient Name:   MARSHAL SCHRECENGOST [facility] Date of Exam: 06/04/2022 Medical Rec #:  [id number]      Height:       65.0 in Accession #:    [id number]     Weight:       173.9 lb Date of Birth:  [birth year]      BSA:          1.864 m Patient Age:    71 years       BP:  150/62 mmHg Patient Gender: F              HR:           72 bpm. Exam Location:  Inpatient Procedure: 2D Echo Indications:     stroke  History:         Patient has prior history of Echocardiogram examinations, most                  recent 03/11/2021. CAD, end stage renal disease,                  Signs/Symptoms:Shortness of Breath; Risk Factors:Hypertension,                  Dyslipidemia and Diabetes.  Sonographer:     Johny Chess RDCS Referring Phys:  [place] Diagnosing Phys: Eleonore Chiquito MD  Sonographer Comments: Image acquisition challenging due to uncooperative patient. IMPRESSIONS  1. Left ventricular ejection fraction, by estimation, is 60 to 65%. The left ventricle has normal function. The left ventricle has no regional wall motion abnormalities. Left ventricular diastolic parameters are consistent with Grade I diastolic dysfunction (impaired relaxation).  2. Right ventricular systolic function is normal. The right ventricular size is  normal. There is mildly elevated pulmonary artery systolic pressure. The estimated right ventricular systolic pressure is 47.4 mmHg.  3. The mitral valve is grossly normal. No evidence of mitral valve regurgitation. No evidence of mitral stenosis.  4. The aortic valve is tricuspid. There is mild calcification of the aortic valve. There is mild thickening of the aortic valve. Aortic valve regurgitation is not visualized. Aortic valve sclerosis is present, with no evidence of aortic valve stenosis.  5. The inferior vena cava is dilated in size with >50% respiratory variability, suggesting right atrial pressure of 8 mmHg. Conclusion(s)/Recommendation(s): No intracardiac source of embolism detected on this transthoracic study. Consider a transesophageal echocardiogram to exclude cardiac source of embolism if clinically indicated. FINDINGS  Left Ventricle: Left ventricular ejection fraction, by estimation, is 60 to 65%. The left ventricle has normal function. The left ventricle has no regional wall motion abnormalities. The left ventricular internal cavity size was normal in size. There is  no left ventricular hypertrophy. Left ventricular diastolic parameters are consistent with Grade I diastolic dysfunction (impaired relaxation). Right Ventricle: The right ventricular size is normal. No increase in right ventricular wall thickness. Right ventricular systolic function is normal. There is mildly elevated pulmonary artery systolic pressure. The tricuspid regurgitant velocity is 2.80  m/s, and with an assumed right atrial pressure of 8 mmHg, the estimated right ventricular systolic pressure is 25.9 mmHg. Left Atrium: Left atrial size was normal in size. Right Atrium: Right atrial size was normal in size. Pericardium: Trivial pericardial effusion is present. Mitral Valve: The mitral valve is grossly normal. No evidence of mitral valve regurgitation. No evidence of mitral valve stenosis. Tricuspid Valve: The tricuspid valve  is grossly normal. Tricuspid valve regurgitation is trivial. No evidence of tricuspid stenosis. Aortic Valve: The aortic valve is tricuspid. There is mild calcification of the aortic valve. There is mild thickening of the aortic valve. Aortic valve regurgitation is not visualized. Aortic valve sclerosis is present, with no evidence of aortic valve stenosis. Pulmonic Valve: The pulmonic valve was grossly normal. Pulmonic valve regurgitation is not visualized. No evidence of pulmonic stenosis. Aorta: The aortic root and ascending aorta are structurally normal, with no evidence of dilitation. Venous: The inferior vena cava is dilated in size  with greater than 50% respiratory variability, suggesting right atrial pressure of 8 mmHg. IAS/Shunts: The atrial septum is grossly normal.  LEFT VENTRICLE PLAX 2D LVIDd:         4.00 cm   Diastology LVIDs:         2.30 cm   LV e' medial:    11.30 cm/s LV PW:         0.90 cm   LV E/e' medial:  8.4 LV IVS:        1.00 cm   LV e' lateral:   9.68 cm/s LVOT diam:     1.90 cm   LV E/e' lateral: 9.8 LV SV:         65 LV SV Index:   35 LVOT Area:     2.84 cm  RIGHT VENTRICLE             IVC RV S prime:     12.80 cm/s  IVC diam: 2.10 cm LEFT ATRIUM             Index        RIGHT ATRIUM           Index LA diam:        3.70 cm 1.98 cm/m   RA Area:     15.20 cm LA Vol (A2C):   28.3 ml 15.18 ml/m  RA Volume:   39.40 ml  21.14 ml/m LA Vol (A4C):   37.3 ml 20.01 ml/m LA Biplane Vol: 34.2 ml 18.35 ml/m  AORTIC VALVE LVOT Vmax:   117.00 cm/s LVOT Vmean:  77.800 cm/s LVOT VTI:    0.229 m  AORTA Ao Root diam: 2.70 cm Ao Asc diam:  2.90 cm MITRAL VALVE                TRICUSPID VALVE MV Area (PHT): 3.60 cm     TR Peak grad:   31.4 mmHg MV Decel Time: 211 msec     TR Vmax:        280.00 cm/s MV E velocity: 94.70 cm/s MV A velocity: 122.00 cm/s  SHUNTS MV E/A ratio:  0.78         Systemic VTI:  0.23 m                             Systemic Diam: 1.90 cm Eleonore Chiquito MD Electronically signed by  Eleonore Chiquito MD Signature Date/Time: 06/04/2022/4:23:53 PM    Final (Updated)    VAS US CAROTID  Result Date: 06/04/2022 Carotid Arterial Duplex Study Patient Name:  OTHELLA SLAPPEY [facility]  Date of Exam:   06/04/2022 Medical Rec #: [id number]       Accession #:    [id number] Date of Birth: 04-26-1951       Patient Gender: F Patient Age:   28 years Exam Location:  [facility] Procedure:      VAS US CAROTID Referring Phys: A POWELL JR --------------------------------------------------------------------------------  Indications:       CVA. Risk Factors:      Hypertension, hyperlipidemia, Diabetes, coronary artery                    disease, prior CVA. Limitations        Today's exam was limited due to the patient's inability or                    unwillingness to cooperate. Comparison Study:  No previous exam noted. Performing Technologist: Bobetta Lime BS, RVT  Examination Guidelines: A complete evaluation includes B-mode imaging, spectral Doppler, color Doppler, and power Doppler as needed of all accessible portions of each vessel. Bilateral testing is considered an integral part of a complete examination. Limited examinations for reoccurring indications may be performed as noted.  Right Carotid Findings: +----------+--------+--------+--------+------------------+--------+           PSV cm/sEDV cm/sStenosisPlaque DescriptionComments +----------+--------+--------+--------+------------------+--------+ CCA Prox  115     0                                          +----------+--------+--------+--------+------------------+--------+ CCA Distal91      14                                         +----------+--------+--------+--------+------------------+--------+ ICA Prox  133     18              heterogenous               +----------+--------+--------+--------+------------------+--------+ ICA Distal130     23                                          +----------+--------+--------+--------+------------------+--------+ ECA       174     0                                          +----------+--------+--------+--------+------------------+--------+ +----------+--------+-------+---------+-------------------+           PSV cm/sEDV cmsDescribe Arm Pressure (mmHG) +----------+--------+-------+---------+-------------------+ Subclavian209            Turbulent                    +----------+--------+-------+---------+-------------------+ +---------+--------+--+--------+--+---------+ VertebralPSV cm/s85EDV cm/s13Antegrade +---------+--------+--+--------+--+---------+  Left Carotid Findings: +----------+--------+--------+--------+------------------+--------+           PSV cm/sEDV cm/sStenosisPlaque DescriptionComments +----------+--------+--------+--------+------------------+--------+ CCA Prox  144     10                                         +----------+--------+--------+--------+------------------+--------+ CCA Distal136     14                                         +----------+--------+--------+--------+------------------+--------+ ICA Prox  108     13              heterogenous               +----------+--------+--------+--------+------------------+--------+ ICA Distal157     23                                         +----------+--------+--------+--------+------------------+--------+ ECA       148     0                                          +----------+--------+--------+--------+------------------+--------+ +----------+--------+--------+----------------+-------------------+  PSV cm/sEDV cm/sDescribe        Arm Pressure (mmHG) +----------+--------+--------+----------------+-------------------+ [id number]             Multiphasic, WNL                    +----------+--------+--------+----------------+-------------------+ +---------+--------+---+--------+--+---------+  VertebralPSV cm/s113EDV cm/s13Antegrade +---------+--------+---+--------+--+---------+   Summary: Right Carotid: Velocities in the right ICA are consistent with a 1-39% stenosis. Left Carotid: Velocities in the left ICA are consistent with a 1-39% stenosis. Vertebrals:  Bilateral vertebral arteries demonstrate antegrade flow. Subclavians: Right subclavian artery flow was disturbed. Normal flow              hemodynamics were seen in the left subclavian artery. *See table(s) above for measurements and observations.  Electronically signed by Antony Contras MD on 06/04/2022 at 11:53:07 AM.    Final    MR ANGIO HEAD WO CONTRAST  Result Date: 06/04/2022 CLINICAL DATA:  Follow-up examination for stroke. EXAM: MRA HEAD WITHOUT CONTRAST TECHNIQUE: Angiographic images of the Circle of Willis were acquired using MRA technique without intravenous contrast. COMPARISON:  Comparison made with previous brain MRI from earlier the same day as well as prior CTA from 05/05/2021. FINDINGS: Anterior circulation: Examination severely degraded by motion artifact, limiting assessment. Visualized distal cervical segments of the internal carotid arteries are patent with antegrade flow. Both internal carotid arteries remain patent through the siphons without obvious stenosis. A1 segments patent. Right A1 hypoplastic. Grossly normal anterior communicating artery complex. Both ACAs grossly patent to their distal aspects on time-of-flight sequence without appreciable stenosis. M1 segments patent without obvious stenosis or occlusion. No visible or obvious proximal MCA branch occlusion. Distal MCA branches grossly perfused and symmetric on time-of-flight sequence. Posterior circulation: Vertebral arteries are code dominant and patent without visible stenosis. Both PICA appear to be grossly patent at their origins. Basilar patent to its distal aspect without appreciable stenosis. Superior cerebellar arteries grossly patent at their origins.  Both PCA supplied via the basilar as well as prominent bilateral posterior communicating arteries. Both PCAs appear grossly perfused and patent to their distal aspects on time-of-flight sequence. No visible stenosis. Anatomic variants: As above. Other: Evaluation for aneurysm extremely limited on this exam. IMPRESSION: 1. Severely motion degraded exam. 2. Grossly negative intracranial MRA for large vessel occlusion. No obvious hemodynamically significant or correctable stenosis. Electronically Signed   By: Jeannine Boga M.D.   On: 06/04/2022 05:11   DG CHEST PORT 1 VIEW  Result Date: 06/03/2022 CLINICAL DATA:  Shortness of breath. EXAM: PORTABLE CHEST 1 VIEW COMPARISON:  Chest x-ray 05/30/2022. FINDINGS: Mild streaky left basilar opacities. No confluent consolidation. No visible pleural effusions or pneumothorax. Similar cardiomediastinal silhouette, which is mildly enlarged. Right IJ approach central venous catheter with the tip projecting at the right atrium. IMPRESSION: Mild streaky left basilar opacities, favor atelectasis. Electronically Signed   By: Margaretha Sheffield M.D.   On: 06/03/2022 14:15   Medications:  magnesium sulfate bolus IVPB     thiamine (VITAMIN B1) injection      (feeding supplement) PROSource Plus  30 mL Oral BID BM   acetaminophen  1,000 mg Oral Q8H   vitamin C  1,000 mg Oral Daily   aspirin EC  81 mg Oral Daily   capsaicin   Topical BID   carvedilol  3.125 mg Oral BID WC   Chlorhexidine Gluconate Cloth  6 each Topical Q0600   clopidogrel  75 mg Oral Daily   darbepoetin (ARANESP) injection - DIALYSIS  200 mcg  Intravenous Q Fri-HD   docusate sodium  100 mg Oral Daily   feeding supplement (NEPRO CARB STEADY)  237 mL Oral BID BM   heparin injection (subcutaneous)  5,000 Units Subcutaneous Q8H   hydrocortisone   Rectal BID   insulin aspart  0-9 Units Subcutaneous TID WC   levETIRAcetam  500 mg Oral QHS   losartan  100 mg Oral Daily   methylphenidate  5 mg Oral BID  WC   mometasone-formoterol  2 puff Inhalation BID   nutrition supplement (JUVEN)  1 packet Oral BID BM   pantoprazole  40 mg Oral Daily   rosuvastatin  10 mg Oral Daily   sevelamer carbonate  0.8 g Oral TID WC   [START ON 06/10/2022] thiamine  100 mg Oral Daily    Dialysis Orders: MWF - [facility] 4 hrs 180NRE 350/700 88.5 kg 2.0 K/2.0 Ca UFP 4 No heparin - Mircera 200 mcg  IV q 2wks - last 04/29/22 - Venofer '100mg'$  IV X 5 doses-new order, hasn't been given yet - Hectorol 1mg IV TIW  Assessment/Plan: 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 DSharol Given Previously off antibiotics, resumed empirically with rising WBC. On IV Vanc/Zosyn per primary.  2. Acute Metabolic Encephalopathy/Intermittent Confusion: Seroquel stopped, minimizing sedatives. CT head negative. Cefepime discontinued. Waxing and waning. Managed by primary. 3. ESRD: Continue HD on usual MWF schedule, tolerating well. Next HD Monday 4. Anemia of ESRD: Hgb 9.7-trending down. continue weekly Aranesp (2072m q Fri). Was on IV iron, stopped 8/5 because of ongoing infection. 5. Secondary hyperparathyroidism : COrrCa high, VDRA on hold. Phos at goal. Continue Renvela as binder. 6. HTN/volume: BP controlled. Reports shortness of breath chronically but no volume overload on chest x-ray. UF with HD as tolerated. 7. Nutrition - Very low albumin, continue protein supplements.  8. T2DM: Per primary.  9. Hx CVA: On dual ASA/plavix, and Keppra. 10. Dispo: SNF placement.   SaAnice PaganiniPA-C 06/05/2022, 8:27 AM  [facility] Pager: [phone]

## 2022-06-05 NOTE — Plan of Care (Signed)

## 2022-06-06 DIAGNOSIS — D72829 Elevated white blood cell count, unspecified: Secondary | ICD-10-CM | POA: Diagnosis not present

## 2022-06-06 LAB — CBC WITH DIFFERENTIAL/PLATELET
Abs Immature Granulocytes: 0.07 10*3/uL (ref 0.00–0.07)
Basophils Absolute: 0.1 10*3/uL (ref 0.0–0.1)
Basophils Relative: 1 %
Eosinophils Absolute: 0.4 10*3/uL (ref 0.0–0.5)
Eosinophils Relative: 3 %
HCT: 27.8 % — ABNORMAL LOW (ref 36.0–46.0)
Hemoglobin: 9.4 g/dL — ABNORMAL LOW (ref 12.0–15.0)
Immature Granulocytes: 1 %
Lymphocytes Relative: 32 %
Lymphs Abs: 4.3 10*3/uL — ABNORMAL HIGH (ref 0.7–4.0)
MCH: 30.8 pg (ref 26.0–34.0)
MCHC: 33.8 g/dL (ref 30.0–36.0)
MCV: 91.1 fL (ref 80.0–100.0)
Monocytes Absolute: 2 10*3/uL — ABNORMAL HIGH (ref 0.1–1.0)
Monocytes Relative: 15 %
Neutro Abs: 6.6 10*3/uL (ref 1.7–7.7)
Neutrophils Relative %: 48 %
Platelets: 377 10*3/uL (ref 150–400)
RBC: 3.05 MIL/uL — ABNORMAL LOW (ref 3.87–5.11)
RDW: 21.8 % — ABNORMAL HIGH (ref 11.5–15.5)
WBC: 13.5 10*3/uL — ABNORMAL HIGH (ref 4.0–10.5)
nRBC: 0.7 % — ABNORMAL HIGH (ref 0.0–0.2)

## 2022-06-06 LAB — COMPREHENSIVE METABOLIC PANEL
ALT: 12 U/L (ref 0–44)
AST: 13 U/L — ABNORMAL LOW (ref 15–41)
Albumin: 2.4 g/dL — ABNORMAL LOW (ref 3.5–5.0)
Alkaline Phosphatase: 61 U/L (ref 38–126)
Anion gap: 16 — ABNORMAL HIGH (ref 5–15)
BUN: 57 mg/dL — ABNORMAL HIGH (ref 8–23)
CO2: 25 mmol/L (ref 22–32)
Calcium: 8.9 mg/dL (ref 8.9–10.3)
Chloride: 93 mmol/L — ABNORMAL LOW (ref 98–111)
Creatinine, Ser: 7.55 mg/dL — ABNORMAL HIGH (ref 0.44–1.00)
GFR, Estimated: 5 mL/min — ABNORMAL LOW (ref >60)
Glucose, Bld: 174 mg/dL — ABNORMAL HIGH (ref 70–99)
Potassium: 3.9 mmol/L (ref 3.5–5.1)
Sodium: 134 mmol/L — ABNORMAL LOW (ref 135–145)
Total Bilirubin: 0.6 mg/dL (ref 0.3–1.2)
Total Protein: 5.7 g/dL — ABNORMAL LOW (ref 6.5–8.1)

## 2022-06-06 LAB — GLUCOSE, CAPILLARY
Glucose-Capillary: 127 mg/dL — ABNORMAL HIGH (ref 70–99)
Glucose-Capillary: 222 mg/dL — ABNORMAL HIGH (ref 70–99)
Glucose-Capillary: 210 mg/dL — ABNORMAL HIGH (ref 70–99)
Glucose-Capillary: 174 mg/dL — ABNORMAL HIGH (ref 70–99)

## 2022-06-06 LAB — MAGNESIUM: Magnesium: 1.8 mg/dL (ref 1.7–2.4)

## 2022-06-06 LAB — PHOSPHORUS: Phosphorus: 5.8 mg/dL — ABNORMAL HIGH (ref 2.5–4.6)

## 2022-06-06 MED ORDER — HEPARIN SODIUM (PORCINE) 1000 UNIT/ML IJ SOLN
2000.0000 [IU] | Freq: Once | INTRAMUSCULAR | Status: AC
Start: 2022-06-06 — End: 2022-06-06
  Administered 2022-06-06: 2000 [IU] via INTRAVENOUS

## 2022-06-06 MED ORDER — HEPARIN SODIUM (PORCINE) 1000 UNIT/ML IJ SOLN
INTRAMUSCULAR | Status: AC
  Filled 2022-06-06: qty 3

## 2022-06-06 MED ORDER — HEPARIN SODIUM (PORCINE) 1000 UNIT/ML IJ SOLN
INTRAMUSCULAR | Status: AC
  Administered 2022-06-06: 3000 [IU]
  Filled 2022-06-06: qty 2

## 2022-06-06 NOTE — Progress Notes (Deleted)
06/06/2022 Carolyn Russo [id number] Feb 17, 1951  Referring provider: Rosita Fire, * Primary GI doctor: Dr. Loletha Carrow  ASSESSMENT AND PLAN:   There are no diagnoses linked to this encounter.   Patient Care Team: [name], MD as PCP - General (Internal Medicine) Belva Crome, MD as PCP - Cardiology (Cardiology) Mayford Knife, [facility] (Pharmacist) Center, [name]  HISTORY OF PRESENT ILLNESS: 71 y.o. 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  04/10/2018 colonoscopy 3 polyps 3 to 5 mm in size, adenomatous, recall 3 years 04/18/2021 flex sigmoidoscopy and upper endoscopy In the hospital for hematochezia, endoscopy completely normal, fleck sigmoidoscopy had moderate amount of stool but showed internal/external hemorrhoids. 04/17/2022 CT abdomen pelvis without contrast nausea and vomiting suspected bowel obstruction showed no evidence of acute findings, moderate hiatal hernia, small epigastric ventral hernia with omental fat, small uterine fibroids.  06/06/2022 CBC showed leukocytosis 13.5, hemoglobin 9.4 MCV 91.1, 6 days ago hemoglobin 11.2.  Platelets 377.  Sodium 134, glucose 174, BUN 57, creatinine 7.55, albumin 2.4, total protein 5.7, AST 13, ALT 12, alk phos 61.  [place]ium 1.8, phosphorus 5.8. CBC  06/06/2022  HGB 9.4 MCV 91.1 with normocytic anemia WBC 13.5 Platelets 377 Anemia panel 05/18/2022  Iron 44 Ferritin 1,215  05/18/2022  B12 734 Kidney function 06/06/2022  BUN 57 Cr 7.55  GFR 5  Potassium 3.9   LFTs 06/06/2022  AST 13 ALT 12 Alkphos 61 TBili 0.6 04/17/2022 LIPASE 25 06/02/2022 TSH  2.095  Current Medications:     Facility-Administered Medications Ordered in Other Visits (Endocrine & Metabolic):    insulin aspart (novoLOG) injection 0-9 Units    Facility-Administered Medications Ordered in Other Visits (Cardiovascular):    carvedilol (COREG) tablet 3.125 mg   hydrALAZINE (APRESOLINE) injection 5 mg   hydrALAZINE (APRESOLINE) tablet 10 mg   labetalol (NORMODYNE) injection 10 mg   losartan (COZAAR) tablet 100 mg   metoprolol tartrate (LOPRESSOR) injection 2-5 mg   rosuvastatin (CRESTOR) tablet 10 mg    Facility-Administered Medications Ordered in Other Visits (Respiratory):    albuterol (PROVENTIL) (2.5 MG/3ML) 0.083% nebulizer solution 2.5 mg   fluticasone (FLONASE) 50 MCG/ACT nasal spray 2 spray   guaiFENesin-dextromethorphan (ROBITUSSIN DM) 100-10 MG/5ML syrup 15 mL   mometasone-formoterol (DULERA) 200-5 MCG/ACT inhaler 2 puff    Facility-Administered Medications Ordered in Other Visits (Analgesics):    acetaminophen (TYLENOL) tablet 1,000 mg   acetaminophen (TYLENOL) tablet 325-650 mg   aspirin EC tablet 81 mg   morphine (PF) 2 MG/ML injection 0.5-1 mg   oxyCODONE (Oxy IR/ROXICODONE) immediate release tablet 2.5 mg    Facility-Administered Medications Ordered in Other Visits (Hematological):    Darbepoetin Alfa (ARANESP) injection 200 mcg   heparin injection 5,000 Units   ticagrelor (BRILINTA) tablet 90 mg    Facility-Administered Medications Ordered in Other Visits (Other):    (feeding supplement) PROSource Plus liquid 30 mL   ascorbic acid (VITAMIN C) tablet 1,000 mg   bisacodyl (DULCOLAX) EC tablet 5 mg   bisacodyl (DULCOLAX) suppository 10 mg   capsaicin (ZOSTRIX) 0.025 % cream   Chlorhexidine Gluconate Cloth 2 % PADS 6 each   docusate sodium (COLACE) capsule 100 mg   feeding supplement (  NEPRO CARB STEADY) liquid 237 mL   hydrocortisone (ANUSOL-HC) 2.5 % rectal cream   levETIRAcetam (KEPPRA) tablet 500 mg   magnesium sulfate IVPB 2  g 50 mL   melatonin tablet 3 mg   methylphenidate (RITALIN) tablet 5 mg   nutrition supplement (JUVEN) (JUVEN) powder packet 1 packet   ondansetron (ZOFRAN) injection 4 mg   ondansetron (ZOFRAN) tablet 8 mg   pantoprazole (PROTONIX) EC tablet 40 mg   phenol (CHLORASEPTIC) mouth spray 1 spray   polyethylene glycol (MIRALAX / GLYCOLAX) packet 17 g   potassium chloride SA (KLOR-CON M) CR tablet 20-40 mEq   senna-docusate (Senokot-S) tablet 1-2 tablet   sevelamer carbonate (RENVELA) packet 0.8 g   [COMPLETED] thiamine (VITAMIN B1) 500 mg in normal saline (50 mL) IVPB **IN FOLLOWED-BY LINKED GROUP WITH** thiamine (VITAMIN B1) 250 mg in sodium chloride 0.9 % 50 mL IVPB **IN FOLLOWED-BY LINKED GROUP WITH** [START ON 06/10/2022] thiamine (VITAMIN B1) tablet 100 mg   witch hazel-glycerin (TUCKS) pad No current facility-administered medications for this visit. No current outpatient medications on file.  Medical History:  Past Medical History:  Diagnosis Date   Acute GI bleeding    Allergy    Anemia    Anterior chest wall pain    Appendicitis 1965   Asthma    Body mass index 37.0-37.9, adult    Breast pain    Cataract    both eyes   CHF (congestive heart failure) ([place])    Chronic kidney disease    stage 5 - on dialysis   Cognitive change 04/20/2021   r/t cva 04/8294   Complication of anesthesia    Memory loss after general   Dehydration 2014   Deviated septum 1971   Diabetes mellitus    Dysphagia due to old stroke    easy to get strangled when eating   Dyspnea 2014   Extrinsic asthma    WITH ASTHMA ATTACK   Fibroid 1980   GERD (gastroesophageal reflux disease)    Heart murmur    History of migraine    History of seizure    with stroke   Hx gestational diabetes    Hyperlipidemia    Hypertension 2014   Inguinal hernia 1959   Malaise and fatigue 2014   Non-IgE mediated allergic asthma 2014   Obesity    Pelvic pain    Pregnancy, high-risk 1985   Stroke ([place]) 04/20/2021    (CVA) of right basal ganglia   Tonsillitis 1968   Uterine fibroid 1980   Visual field defect    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   Allergies:  Allergies  Allergen Reactions   Food Anaphylaxis    Peanuts; Almonds   Wheat Bran     Upset stomach    Statins Itching and Other (See Comments)    Generalized aches- tolerates crestor    Pork-Derived Products     Does not eat pork    Shellfish Allergy Other (See Comments)    Mouth gets raw   Sitagliptin Other (See Comments)    Januiva - unknown reaction    Tetracycline Other (See Comments)    Raw mouth   Contrast Media [Iodinated Contrast Media] Rash    Happened during CT scan over 30 years ago     Surgical History:  She  has a past surgical history that includes Cesarean section (1985); Rotator cuff repair (2003); Appendectomy (1959); Tonsillectomy (1968); Myomectomy (1980, 2004, 2007); Hernia repair [phone]); SURGICAL REPAIR OF HEMORRHAGE (2015); Rhinoplasty (1971); LEFT  HEART CATH AND CORONARY ANGIOGRAPHY (N/A, 04/04/2017); Colonoscopy; Eye surgery; IR Fluoro Guide CV Line Right (03/18/2021); IR US Guide Vasc Access Right (03/18/2021); Esophagogastroduodenoscopy (egd) with propofol (N/A, 04/18/2021); Flexible sigmoidoscopy (N/A, 04/18/2021); Bascilic vein transposition (Right, 04/30/2021); ABDOMINAL AORTOGRAM W/LOWER EXTREMITY (N/A, 02/21/2022); PERIPHERAL VASCULAR THROMBECTOMY (02/21/2022); Amputation toe (Right, 04/15/2022); and Amputation (Right, 05/18/2022). Family History:  Her family history includes Alzheimer's disease in her father; Cancer in her cousin and mother; Diabetes in her cousin and maternal aunt; Psoriasis in her mother. Social History:   reports that she has never smoked. She has never used smokeless tobacco. She reports that she does not drink alcohol and does not use drugs.  REVIEW OF SYSTEMS  : All other systems reviewed and negative except where noted in the History of Present Illness.   PHYSICAL EXAM: There were no vitals taken for this  visit. General:   Pleasant, well developed female in no acute distress Head:   Normocephalic and atraumatic. Eyes:  {sclerae:26738},conjunctive {conjuctiva:26739}  Heart:   {HEART EXAM HEM/ONC:21750} Pulm:  Clear anteriorly; no wheezing Abdomen:   {BlankSingle:19197::"Distended","Ridged","Soft"}, {BlankSingle:19197::"Flat","Obese","Non-distended"} AB, {BlankSingle:19197::"Absent","Hyperactive, tinkling","Hypoactive","Sluggish","Active"} bowel sounds. {actendernessAB:27319} tenderness {anatomy; site abdomen:5010}. {BlankMultiple:19196::"Without guarding","With guarding","Without rebound","With rebound"}, No organomegaly appreciated. Rectal: {acrectalexam:27461} Extremities:  {With/Without:304960234} edema. Msk: Symmetrical without gross deformities. Peripheral pulses intact.  Neurologic:  Alert and  oriented x4;  No focal deficits.  Skin:   Dry and intact without significant lesions or rashes. Psychiatric:  Cooperative. Normal mood and affect.    Vladimir Crofts, PA-C 11:34 AM

## 2022-06-06 NOTE — Progress Notes (Signed)
PROGRESS NOTE    Carolyn Russo  OZH:[id number] DOB: [birth year] DOA: 05/10/2022 PCP: [name], MD  Chief Complaint  Patient presents with   Cellulitis    Brief Narrative:  Carolyn Russo is Carolyn Russo 71 y.o. female with Carolyn Russo history of ESRD on HD, CVA with left hemiplegia, seizures, diabetes, hypertension, hyperlipidemia, chronic systolic heart failure, PAD, GI bleed. Patient presented secondary to worsening right foot infection, found to have gangrene. Empiric antibiotics initiated and patient underwent Carolyn Russo right BKA. During hospitalization, course has been complicated by altered mental status, in addition to presumed infection of unknown source. Currently resumed on antibiotics.    Assessment & Plan:   Principal Problem:   Leukocytosis Active Problems:   Gangrene of right foot (HCC)   PAD (peripheral artery disease) (HCC)   Encephalopathy   Shortness of breath   Acute stroke due to ischemia [facility])   Chest pain   History of CVA (cerebrovascular accident)   Anemia in chronic kidney disease   ESRD on dialysis ([place])   Type 2 diabetes mellitus (HCC)   Obesity (BMI 30-39.9)   Hypertensive disorder   Dyslipidemia (high LDL; low HDL)   CAD (coronary artery disease), native coronary artery   Asthma   Diabetic retinopathy (HCC)   Basal ganglia infarction (HCC)   Foot infection   Below-knee amputation of right lower extremity (HCC)   Seizures (HCC)   Assessment and Plan: * Leukocytosis Afebrile, currently on vanc/zosyn empirically -> monitoring off abx at this time Unclear cause at this time -> but gradually improving CXR 8/7 without acute findings Blood cx from 8/5 with bacillus species (suspected contaminant) Will repeat blood cx 8/10 NGx2   Gangrene of right foot (HCC) Empiric Vancomycin and Cefepime initiated on admission. Vascular and orthopedic surgery consulted. Patient underwent right BKA on 7/26 with subsequent discontinuation of antibiotics. PT/OT consulted with  recommendations for SNF, however family is hoping for possible discharge to CIR. Patient with some pain of her leg but manageable. Wound vacuum removed on 8/6 for evaluation for infection (per Dr. Sharol Given on 8/7, no signs of infection).  PAD (peripheral artery disease) (HCC) plavix  Encephalopathy Initial workup without specific/reversible etiology identified. Possibly related to medication effect. Patient with slow but positive recovery of mental status function. On 8/8, patient found to be lethargic with Carolyn [place] blood Russo of 46. D50 given. Persistent stupor. Possible she could have had Randa Riss seizure from hypoglycemia with resultant post-ictal phase. Cefepime recently started which could also have contributed, but episode seems to have coincided with hypoglycemic episode. -ABG without hypercarbia -Ammonia 11 on 04/2022 -vitamin b12 wnl, folate wnl -EEG without seizures or epileptiform discharges -CT head without acute process -cefepime d/c'd -high dose thiamine -MRI with small acute infarcts in the L corona radiata and R parietal cortex -> per neurology, these don't explain encephalopathy, recommending LTM EEG for recurrent lethargy Her mental status has been consistently improved over the past several days  Acute stroke due to ischemia [facility]) MRI with small acute infarcts in L corona radiata and R parietal cortex Neurology c/s - likely due to small vessel disease Echo with EF 60-65%, no RWMA, grade 1 diastolic dysfunction , carotid US with 1-39% stenosis bilaterally, right subclavian flow disturbed, MRA brain without contrast severly motion degraded, grossly negative intracranial MRA for LVO A1c 4.6, lipid panel PT/OT/SLP Neurology recommending DAPT with aspirin and brilintax30 days (will need to discuss with vascular regarding whether long term need for DAPT).  Also recommending outpatient 30 day event  monitor.   Shortness of breath Currently on RA CXR today favors atelectasis Per discussion with  husband, this is chronic complaint, w/u further if recurrent or worsening - no complaints today  History of CVA (cerebrovascular accident) Resultant L hemiplegia, more notable weakness today per discussion with husband? ? Related to episode of hypoglycemia/AMS -> MRI as above  Chest pain Reproducible per previous provider  ESRD on dialysis [facility]) Renal c/s, appreciate assistance  Anemia in chronic kidney disease trend  Type 2 diabetes mellitus ([place]) SSI, hold basal/bolus with episode of hypoglycemia causing confusion     DVT prophylaxis: heparin Code Status: full Family Communication: husband Disposition:   Status is: Inpatient Remains inpatient appropriate because: pending further improvement    Consultants:  renal  Procedures:  Right BKA (7/26)  Antimicrobials:  Anti-infectives (From admission, onward)    Start     Dose/Rate Route Frequency Ordered Stop   05/31/22 1430  piperacillin-tazobactam (ZOSYN) IVPB 2.25 g        2.25 g 100 mL/hr over 30 Minutes Intravenous Every 8 hours 05/31/22 1354 06/05/22 2157   05/30/22 1200  vancomycin (VANCOREADY) IVPB 750 mg/150 mL  Status:  Discontinued        750 mg 150 mL/hr over 60 Minutes Intravenous Every M-W-F (Hemodialysis) 05/28/22 1754 06/04/22 1702   05/28/22 1845  vancomycin (VANCOREADY) IVPB 1250 mg/250 mL        1,250 mg 166.7 mL/hr over 90 Minutes Intravenous  Once 05/28/22 1754 05/28/22 2108   05/28/22 1845  ceFEPIme (MAXIPIME) 1 g in sodium chloride 0.9 % 100 mL IVPB  Status:  Discontinued        1 g 200 mL/hr over 30 Minutes Intravenous Every 24 hours 05/28/22 1754 05/31/22 1241   05/18/22 1300  ceFAZolin (ANCEF) IVPB 2g/100 mL premix  Status:  Discontinued        2 g 200 mL/hr over 30 Minutes Intravenous Every 8 hours 05/18/22 1212 05/18/22 1233   05/14/22 1130  vancomycin (VANCOCIN) IVPB 1000 mg/200 mL premix        1,000 mg 200 mL/hr over 60 Minutes Intravenous  Once 05/14/22 1038 05/14/22 1429   05/11/22  1900  ceFEPIme (MAXIPIME) 1 g in sodium chloride 0.9 % 100 mL IVPB  Status:  Discontinued        1 g 200 mL/hr over 30 Minutes Intravenous Every 24 hours 05/10/22 1715 05/18/22 1633   05/11/22 1200  vancomycin (VANCOCIN) IVPB 1000 mg/200 mL premix  Status:  Discontinued        1,000 mg 200 mL/hr over 60 Minutes Intravenous Every M-W-F (Hemodialysis) 05/10/22 1715 05/19/22 0925   05/10/22 1730  vancomycin (VANCOREADY) IVPB 1750 mg/350 mL        1,750 mg 175 mL/hr over 120 Minutes Intravenous  Once 05/10/22 1715 05/10/22 2151   05/10/22 1730  ceFEPIme (MAXIPIME) 2 g in sodium chloride 0.9 % 100 mL IVPB        2 g 200 mL/hr over 30 Minutes Intravenous  Once 05/10/22 1715 05/10/22 1840       Subjective: No new complaints Husband at bedside  Objective: Vitals:   06/06/22 1400 06/06/22 1430 06/06/22 1500 06/06/22 1512  BP: (!) 139/104 (!) 134/100  (!) 156/70  Pulse: 70 70 60 73  Resp: '16 18 14 16  '$ Temp:      TempSrc:      SpO2: 100% 100% 100% 100%  Weight:      Height:  Intake/Output Summary (Last 24 hours) at 06/06/2022 1605 Last data filed at 06/06/2022 1300 Gross per 24 hour  Intake 81.58 ml  Output --  Net 81.58 ml    Filed Weights   06/03/22 0717 06/03/22 0729 06/03/22 1139  Weight: 80.9 kg 80.9 kg 78.9 kg    Examination:  General: No acute distress. Cardiovascular: RRR Lungs: unlabored Abdomen: Soft, nontender, nondistended  Neurological: L sided weakness, alert interactive Extremities: R BKA   Data Reviewed: I have personally reviewed following labs and imaging studies  CBC: Recent Labs  Lab 06/02/22 0702 06/03/22 0138 06/04/22 0132 06/05/22 0112 06/06/22 0328  WBC 16.4* 16.1* 15.2* 14.0* 13.5*  NEUTROABS 11.5* 10.5* 9.4* 6.9 6.6  HGB 9.0* 9.2* 8.9* 8.7* 9.4*  HCT 25.6* 26.5* 25.2* 24.6* 27.8*  MCV 88.3 88.9 90.0 89.1 91.1  PLT 339 329 341 341 528    Basic Metabolic Panel: Recent Labs  Lab 06/02/22 0702 06/03/22 0138 06/04/22 0132  06/05/22 0112 06/06/22 0328  NA 131* 128* 133* 135 134*  K 3.4* 3.6 3.6 3.8 3.9  CL 92* 92* 94* 95* 93*  CO2 '24 23 26 25 25  '$ GLUCOSE 82 116* 178* 234* 174*  BUN 26* 41* 25* 42* 57*  CREATININE 4.58* 5.79* 3.81* 5.80* 7.55*  CALCIUM 8.4* 8.4* 8.4* 8.8* 8.9  MG 1.6* 1.6*  --  1.5* 1.8  PHOS 3.8 4.8*  --  5.2* 5.8*    GFR: Estimated Creatinine Clearance: 6.9 mL/min (Ebrima Ranta) (by C-G formula based on SCr of 7.55 mg/dL (H)).  Liver Function Tests: Recent Labs  Lab 06/02/22 0702 06/03/22 0138 06/04/22 0132 06/05/22 0112 06/06/22 0328  AST 15 14* 15 13* 13*  ALT '15 15 14 14 12  '$ ALKPHOS 58 52 64 59 61  BILITOT 0.9 0.8 0.7 0.5 0.6  PROT 6.1* 5.9* 5.6* 5.6* 5.7*  ALBUMIN 2.3* 2.4* 2.2* 2.3* 2.4*    CBG: Recent Labs  Lab 06/05/22 1129 06/05/22 1537 06/05/22 2225 06/06/22 0743 06/06/22 1144  GLUCAP 196* 144* 189* 127* 222*     Recent Results (from the past 240 hour(s))  Culture, blood (Routine X 2) w Reflex to ID Panel     Status: None   Collection Time: 05/28/22  6:10 PM   Specimen: BLOOD LEFT HAND  Result Value Ref Range Status   Specimen Description BLOOD LEFT HAND  Final   Special Requests   Final    BOTTLES DRAWN AEROBIC AND ANAEROBIC Blood Culture results may not be optimal due to an inadequate volume of blood received in culture bottles   Culture   Final    NO GROWTH 5 DAYS Performed at [facility], [place] [address], [name], [place] [address]    Report Status 06/02/2022 FINAL  Final  Culture, blood (Routine X 2) w Reflex to ID Panel     Status: Abnormal   Collection Time: 05/28/22  6:10 PM   Specimen: BLOOD LEFT HAND  Result Value Ref Range Status   Specimen Description BLOOD LEFT HAND  Final   Special Requests   Final    BOTTLES DRAWN AEROBIC AND ANAEROBIC Blood Culture adequate volume   Culture  Setup Time   Final    GRAM POSITIVE RODS AEROBIC BOTTLE ONLY CRITICAL RESULT CALLED TO, READ BACK BY AND VERIFIED WITH: PHARMD BEN.M AT 0919 ON 05/28/2022 BY  T.SAAD.    Culture (Daemon Dowty)  Final    BACILLUS SPECIES Standardized susceptibility testing for this organism is not available. Performed at [facility], [place] [address], [place], [place] [address]    Report Status 05/30/2022 FINAL  Final  Culture, blood (Routine X 2) w Reflex to ID Panel     Status: None (Preliminary result)   Collection Time: 06/02/22  7:02 AM   Specimen: BLOOD LEFT HAND  Result Value Ref Range Status   Specimen Description BLOOD LEFT HAND  Final   Special Requests   Final    BOTTLES DRAWN AEROBIC AND ANAEROBIC Blood Culture adequate volume   Culture   Final    NO GROWTH 4 DAYS Performed at [facility], [place] [address], [place], [place] [address]    Report Status PENDING  Incomplete  Culture, blood (Routine X 2) w Reflex to ID Panel     Status: None (Preliminary result)   Collection Time: 06/02/22  7:08 AM   Specimen: BLOOD LEFT HAND  Result Value Ref Range Status   Specimen Description BLOOD LEFT HAND  Final   Special Requests   Final    BOTTLES DRAWN AEROBIC AND ANAEROBIC Blood Culture results may not be optimal due to an inadequate volume of blood received in culture bottles   Culture   Final    NO GROWTH 4 DAYS Performed at [facility], [place] [address], [place], [place] [address]    Report Status PENDING  Incomplete         Radiology Studies: ECHOCARDIOGRAM COMPLETE  Result Date: 06/04/2022    ECHOCARDIOGRAM REPORT   Patient Name:   SHONDA MANDARINO [facility] Date of Exam: 06/04/2022 Medical Rec #:  [id number]      Height:       65.0 in Accession #:    [id number]     Weight:       173.9 lb Date of Birth:  [birth year]      BSA:          1.864 m Patient Age:    21 years       BP:           150/62 mmHg Patient Gender: F              HR:           72 bpm. Exam Location:  Inpatient Procedure: 2D Echo Indications:     stroke  History:         Patient has prior history of Echocardiogram examinations, most                  recent 03/11/2021. CAD, end stage renal  disease,                  Signs/Symptoms:Shortness of Breath; Risk Factors:Hypertension,                  Dyslipidemia and Diabetes.  Sonographer:     Johny Chess RDCS Referring Phys:  [place] Diagnosing Phys: Eleonore Chiquito MD  Sonographer Comments: Image acquisition challenging due to uncooperative patient. IMPRESSIONS  1. Left ventricular ejection fraction, by estimation, is 60 to 65%. The left ventricle has normal function. The left ventricle has no regional wall motion abnormalities. Left ventricular diastolic parameters are consistent with Grade I diastolic dysfunction (impaired relaxation).  2. Right ventricular systolic function is normal. The right ventricular size is normal. There is mildly elevated pulmonary artery systolic pressure. The estimated right ventricular systolic pressure is 29.4 mmHg.  3. The mitral valve is grossly normal. No evidence of mitral valve regurgitation. No  evidence of mitral stenosis.  4. The aortic valve is tricuspid. There is mild calcification of the aortic valve. There is mild thickening of the aortic valve. Aortic valve regurgitation is not visualized. Aortic valve sclerosis is present, with no evidence of aortic valve stenosis.  5. The inferior vena cava is dilated in size with >50% respiratory variability, suggesting right atrial pressure of 8 mmHg. Conclusion(s)/Recommendation(s): No intracardiac source of embolism detected on this transthoracic study. Consider Jamus Loving transesophageal echocardiogram to exclude cardiac source of embolism if clinically indicated. FINDINGS  Left Ventricle: Left ventricular ejection fraction, by estimation, is 60 to 65%. The left ventricle has normal function. The left ventricle has no regional wall motion abnormalities. The left ventricular internal cavity size was normal in size. There is  no left ventricular hypertrophy. Left ventricular diastolic parameters are consistent with Grade I diastolic dysfunction (impaired  relaxation). Right Ventricle: The right ventricular size is normal. No increase in right ventricular wall thickness. Right ventricular systolic function is normal. There is mildly elevated pulmonary artery systolic pressure. The tricuspid regurgitant velocity is 2.80  m/s, and with an assumed right atrial pressure of 8 mmHg, the estimated right ventricular systolic pressure is 92.3 mmHg. Left Atrium: Left atrial size was normal in size. Right Atrium: Right atrial size was normal in size. Pericardium: Trivial pericardial effusion is present. Mitral Valve: The mitral valve is grossly normal. No evidence of mitral valve regurgitation. No evidence of mitral valve stenosis. Tricuspid Valve: The tricuspid valve is grossly normal. Tricuspid valve regurgitation is trivial. No evidence of tricuspid stenosis. Aortic Valve: The aortic valve is tricuspid. There is mild calcification of the aortic valve. There is mild thickening of the aortic valve. Aortic valve regurgitation is not visualized. Aortic valve sclerosis is present, with no evidence of aortic valve stenosis. Pulmonic Valve: The pulmonic valve was grossly normal. Pulmonic valve regurgitation is not visualized. No evidence of pulmonic stenosis. Aorta: The aortic root and ascending aorta are structurally normal, with no evidence of dilitation. Venous: The inferior vena cava is dilated in size with greater than 50% respiratory variability, suggesting right atrial pressure of 8 mmHg. IAS/Shunts: The atrial septum is grossly normal.  LEFT VENTRICLE PLAX 2D LVIDd:         4.00 cm   Diastology LVIDs:         2.30 cm   LV e' medial:    11.30 cm/s LV PW:         0.90 cm   LV E/e' medial:  8.4 LV IVS:        1.00 cm   LV e' lateral:   9.68 cm/s LVOT diam:     1.90 cm   LV E/e' lateral: 9.8 LV SV:         65 LV SV Index:   35 LVOT Area:     2.84 cm  RIGHT VENTRICLE             IVC RV S prime:     12.80 cm/s  IVC diam: 2.10 cm LEFT ATRIUM             Index        RIGHT ATRIUM            Index LA diam:        3.70 cm 1.98 cm/m   RA Area:     15.20 cm LA Vol (A2C):   28.3 ml 15.18 ml/m  RA Volume:   39.40 ml  21.14 ml/m LA Vol (  A4C):   37.3 ml 20.01 ml/m LA Biplane Vol: 34.2 ml 18.35 ml/m  AORTIC VALVE LVOT Vmax:   117.00 cm/s LVOT Vmean:  77.800 cm/s LVOT VTI:    0.229 m  AORTA Ao Root diam: 2.70 cm Ao Asc diam:  2.90 cm MITRAL VALVE                TRICUSPID VALVE MV Area (PHT): 3.60 cm     TR Peak grad:   31.4 mmHg MV Decel Time: 211 msec     TR Vmax:        280.00 cm/s MV E velocity: 94.70 cm/s MV Triton Heidrich velocity: 122.00 cm/s  SHUNTS MV E/Starr Urias ratio:  0.78         Systemic VTI:  0.23 m                             Systemic Diam: 1.90 cm Eleonore Chiquito MD Electronically signed by Eleonore Chiquito MD Signature Date/Time: 06/04/2022/4:23:53 PM    Final (Updated)         Scheduled Meds:  (feeding supplement) PROSource Plus  30 mL Oral BID BM   acetaminophen  1,000 mg Oral Q8H   vitamin C  1,000 mg Oral Daily   aspirin EC  81 mg Oral Daily   capsaicin   Topical BID   carvedilol  3.125 mg Oral BID WC   Chlorhexidine Gluconate Cloth  6 each Topical Q0600   darbepoetin (ARANESP) injection - DIALYSIS  200 mcg Intravenous Q Fri-HD   docusate sodium  100 mg Oral Daily   feeding supplement (NEPRO CARB STEADY)  237 mL Oral BID BM   heparin injection (subcutaneous)  5,000 Units Subcutaneous Q8H   heparin sodium (porcine)       hydrocortisone   Rectal BID   insulin aspart  0-9 Units Subcutaneous TID WC   levETIRAcetam  500 mg Oral QHS   losartan  100 mg Oral Daily   methylphenidate  5 mg Oral BID WC   mometasone-formoterol  2 puff Inhalation BID   nutrition supplement (JUVEN)  1 packet Oral BID BM   pantoprazole  40 mg Oral Daily   rosuvastatin  10 mg Oral Daily   sevelamer carbonate  0.8 g Oral TID WC   [START ON 06/10/2022] thiamine  100 mg Oral Daily   ticagrelor  90 mg Oral BID   Continuous Infusions:  magnesium sulfate bolus IVPB     thiamine (VITAMIN B1) injection Stopped  (06/06/22 1143)     LOS: 27 days    Time spent: over 30 min    Fayrene Helper, MD [facility]   To contact the attending provider between 7A-7P or the covering provider during after hours 7P-7A, please log into the web site [email or web address] and access using universal [place] password for that web site. If you do not have the password, please call the hospital operator.  06/06/2022, 4:05 PM

## 2022-06-06 NOTE — Plan of Care (Signed)

## 2022-06-06 NOTE — Progress Notes (Addendum)
Paged and notified Dr Moshe Cipro patient wanting to end tx early. Telephone consent received from pt husband to end tx early

## 2022-06-06 NOTE — Progress Notes (Signed)
[facility] Progress Note   Subjective:   Pt seen in room, husband at bedside. Husband helping her eat. No new concerns. Mentation better this am. Dialysis today.    Objective Vitals:   06/06/22 0749 06/06/22 0800 06/06/22 0900 06/06/22 0932  BP: (!) 155/61     Pulse: 67   70  Resp: (!) '21 17 19 18  '$ Temp: 97.6 F (36.4 C)     TempSrc: Oral     SpO2: 100%   97%  Weight:      Height:       Physical Exam General: Alert female in NAD Heart: RRR, no murmurs, rubs or gallops Lungs: CTA bilaterally without wheezing, rhonchi or rales Abdomen: Soft, non-distended, +BS Extremities: No edema b/l lower extremities Dialysis Access: [facility]  Additional Objective Labs: Basic Metabolic Panel: Recent Labs  Lab 06/03/22 0138 06/04/22 0132 06/05/22 0112 06/06/22 0328  NA 128* 133* 135 134*  K 3.6 3.6 3.8 3.9  CL 92* 94* 95* 93*  CO2 '23 26 25 25  '$ GLUCOSE 116* 178* 234* 174*  BUN 41* 25* 42* 57*  CREATININE 5.79* 3.81* 5.80* 7.55*  CALCIUM 8.4* 8.4* 8.8* 8.9  PHOS 4.8*  --  5.2* 5.8*    Liver Function Tests: Recent Labs  Lab 06/04/22 0132 06/05/22 0112 06/06/22 0328  AST 15 13* 13*  ALT '14 14 12  '$ ALKPHOS 64 59 61  BILITOT 0.7 0.5 0.6  PROT 5.6* 5.6* 5.7*  ALBUMIN 2.2* 2.3* 2.4*    No results for input(s): "LIPASE", "AMYLASE" in the last 168 hours. CBC: Recent Labs  Lab 06/02/22 0702 06/03/22 0138 06/04/22 0132 06/05/22 0112 06/06/22 0328  WBC 16.4* 16.1* 15.2* 14.0* 13.5*  NEUTROABS 11.5* 10.5* 9.4* 6.9 6.6  HGB 9.0* 9.2* 8.9* 8.7* 9.4*  HCT 25.6* 26.5* 25.2* 24.6* 27.8*  MCV 88.3 88.9 90.0 89.1 91.1  PLT 339 329 341 341 377    Blood Culture    Component Value Date/Time   SDES BLOOD LEFT HAND 06/02/2022 0708   SPECREQUEST  06/02/2022 0708    BOTTLES DRAWN AEROBIC AND ANAEROBIC Blood Culture results may not be optimal due to an inadequate volume of blood received in culture bottles   CULT  06/02/2022 0708    NO GROWTH 4 DAYS Performed at  [facility], [place] [address], [place], [place] [address]    REPTSTATUS PENDING 06/02/2022 0708    Cardiac Enzymes: No results for input(s): "CKTOTAL", "CKMB", "CKMBINDEX", "TROPONINI" in the last 168 hours. CBG: Recent Labs  Lab 06/05/22 0830 06/05/22 1129 06/05/22 1537 06/05/22 2225 06/06/22 0743  GLUCAP 125* 196* 144* 189* 127*    Iron Studies: No results for input(s): "IRON", "TIBC", "TRANSFERRIN", "FERRITIN" in the last 72 hours. '@lablastinr3'$ @ Studies/Results: ECHOCARDIOGRAM COMPLETE  Result Date: 06/04/2022    ECHOCARDIOGRAM REPORT   Patient Name:   Carolyn Russo [facility] Date of Exam: 06/04/2022 Medical Rec #:  [id number]      Height:       65.0 in Accession #:    [id number]     Weight:       173.9 lb Date of Birth:  [birth year]      BSA:          1.864 m Patient Age:    71 years       BP:           150/62 mmHg Patient Gender: F              HR:  72 bpm. Exam Location:  Inpatient Procedure: 2D Echo Indications:     stroke  History:         Patient has prior history of Echocardiogram examinations, most                  recent 03/11/2021. CAD, end stage renal disease,                  Signs/Symptoms:Shortness of Breath; Risk Factors:Hypertension,                  Dyslipidemia and Diabetes.  Sonographer:     Johny Chess RDCS Referring Phys:  [place] Diagnosing Phys: Eleonore Chiquito MD  Sonographer Comments: Image acquisition challenging due to uncooperative patient. IMPRESSIONS  1. Left ventricular ejection fraction, by estimation, is 60 to 65%. The left ventricle has normal function. The left ventricle has no regional wall motion abnormalities. Left ventricular diastolic parameters are consistent with Grade I diastolic dysfunction (impaired relaxation).  2. Right ventricular systolic function is normal. The right ventricular size is normal. There is mildly elevated pulmonary artery systolic pressure. The estimated right ventricular systolic pressure is 41.6 mmHg.   3. The mitral valve is grossly normal. No evidence of mitral valve regurgitation. No evidence of mitral stenosis.  4. The aortic valve is tricuspid. There is mild calcification of the aortic valve. There is mild thickening of the aortic valve. Aortic valve regurgitation is not visualized. Aortic valve sclerosis is present, with no evidence of aortic valve stenosis.  5. The inferior vena cava is dilated in size with >50% respiratory variability, suggesting right atrial pressure of 8 mmHg. Conclusion(s)/Recommendation(s): No intracardiac source of embolism detected on this transthoracic study. Consider a transesophageal echocardiogram to exclude cardiac source of embolism if clinically indicated. FINDINGS  Left Ventricle: Left ventricular ejection fraction, by estimation, is 60 to 65%. The left ventricle has normal function. The left ventricle has no regional wall motion abnormalities. The left ventricular internal cavity size was normal in size. There is  no left ventricular hypertrophy. Left ventricular diastolic parameters are consistent with Grade I diastolic dysfunction (impaired relaxation). Right Ventricle: The right ventricular size is normal. No increase in right ventricular wall thickness. Right ventricular systolic function is normal. There is mildly elevated pulmonary artery systolic pressure. The tricuspid regurgitant velocity is 2.80  m/s, and with an assumed right atrial pressure of 8 mmHg, the estimated right ventricular systolic pressure is 60.6 mmHg. Left Atrium: Left atrial size was normal in size. Right Atrium: Right atrial size was normal in size. Pericardium: Trivial pericardial effusion is present. Mitral Valve: The mitral valve is grossly normal. No evidence of mitral valve regurgitation. No evidence of mitral valve stenosis. Tricuspid Valve: The tricuspid valve is grossly normal. Tricuspid valve regurgitation is trivial. No evidence of tricuspid stenosis. Aortic Valve: The aortic valve is  tricuspid. There is mild calcification of the aortic valve. There is mild thickening of the aortic valve. Aortic valve regurgitation is not visualized. Aortic valve sclerosis is present, with no evidence of aortic valve stenosis. Pulmonic Valve: The pulmonic valve was grossly normal. Pulmonic valve regurgitation is not visualized. No evidence of pulmonic stenosis. Aorta: The aortic root and ascending aorta are structurally normal, with no evidence of dilitation. Venous: The inferior vena cava is dilated in size with greater than 50% respiratory variability, suggesting right atrial pressure of 8 mmHg. IAS/Shunts: The atrial septum is grossly normal.  LEFT VENTRICLE PLAX 2D LVIDd:  4.00 cm   Diastology LVIDs:         2.30 cm   LV e' medial:    11.30 cm/s LV PW:         0.90 cm   LV E/e' medial:  8.4 LV IVS:        1.00 cm   LV e' lateral:   9.68 cm/s LVOT diam:     1.90 cm   LV E/e' lateral: 9.8 LV SV:         65 LV SV Index:   35 LVOT Area:     2.84 cm  RIGHT VENTRICLE             IVC RV S prime:     12.80 cm/s  IVC diam: 2.10 cm LEFT ATRIUM             Index        RIGHT ATRIUM           Index LA diam:        3.70 cm 1.98 cm/m   RA Area:     15.20 cm LA Vol (A2C):   28.3 ml 15.18 ml/m  RA Volume:   39.40 ml  21.14 ml/m LA Vol (A4C):   37.3 ml 20.01 ml/m LA Biplane Vol: 34.2 ml 18.35 ml/m  AORTIC VALVE LVOT Vmax:   117.00 cm/s LVOT Vmean:  77.800 cm/s LVOT VTI:    0.229 m  AORTA Ao Root diam: 2.70 cm Ao Asc diam:  2.90 cm MITRAL VALVE                TRICUSPID VALVE MV Area (PHT): 3.60 cm     TR Peak grad:   31.4 mmHg MV Decel Time: 211 msec     TR Vmax:        280.00 cm/s MV E velocity: 94.70 cm/s MV A velocity: 122.00 cm/s  SHUNTS MV E/A ratio:  0.78         Systemic VTI:  0.23 m                             Systemic Diam: 1.90 cm Eleonore Chiquito MD Electronically signed by Eleonore Chiquito MD Signature Date/Time: 06/04/2022/4:23:53 PM    Final (Updated)    VAS US CAROTID  Result Date: 06/04/2022 Carotid  Arterial Duplex Study Patient Name:  JENEVIE CASSTEVENS [facility]  Date of Exam:   06/04/2022 Medical Rec #: [id number]       Accession #:    [id number] Date of Birth: [birth year]       Patient Gender: F Patient Age:   82 years Exam Location:  [facility] Procedure:      VAS US CAROTID Referring Phys: A POWELL JR --------------------------------------------------------------------------------  Indications:       CVA. Risk Factors:      Hypertension, hyperlipidemia, Diabetes, coronary artery                    disease, prior CVA. Limitations        Today's exam was limited due to the patient's inability or                    unwillingness to cooperate. Comparison Study:  No previous exam noted. Performing Technologist: Bobetta Lime BS, RVT  Examination Guidelines: A complete evaluation includes B-mode imaging, spectral Doppler, color Doppler, and power Doppler as needed of all accessible portions of each  vessel. Bilateral testing is considered an integral part of a complete examination. Limited examinations for reoccurring indications may be performed as noted.  Right Carotid Findings: +----------+--------+--------+--------+------------------+--------+           PSV cm/sEDV cm/sStenosisPlaque DescriptionComments +----------+--------+--------+--------+------------------+--------+ CCA Prox  115     0                                          +----------+--------+--------+--------+------------------+--------+ CCA Distal91      14                                         +----------+--------+--------+--------+------------------+--------+ ICA Prox  133     18              heterogenous               +----------+--------+--------+--------+------------------+--------+ ICA Distal130     23                                         +----------+--------+--------+--------+------------------+--------+ ECA       174     0                                           +----------+--------+--------+--------+------------------+--------+ +----------+--------+-------+---------+-------------------+           PSV cm/sEDV cmsDescribe Arm Pressure (mmHG) +----------+--------+-------+---------+-------------------+ Subclavian209            Turbulent                    +----------+--------+-------+---------+-------------------+ +---------+--------+--+--------+--+---------+ VertebralPSV cm/s85EDV cm/s13Antegrade +---------+--------+--+--------+--+---------+  Left Carotid Findings: +----------+--------+--------+--------+------------------+--------+           PSV cm/sEDV cm/sStenosisPlaque DescriptionComments +----------+--------+--------+--------+------------------+--------+ CCA Prox  144     10                                         +----------+--------+--------+--------+------------------+--------+ CCA Distal136     14                                         +----------+--------+--------+--------+------------------+--------+ ICA Prox  108     13              heterogenous               +----------+--------+--------+--------+------------------+--------+ ICA Distal157     23                                         +----------+--------+--------+--------+------------------+--------+ ECA       148     0                                          +----------+--------+--------+--------+------------------+--------+ +----------+--------+--------+----------------+-------------------+  PSV cm/sEDV cm/sDescribe        Arm Pressure (mmHG) +----------+--------+--------+----------------+-------------------+ [id number]             Multiphasic, WNL                    +----------+--------+--------+----------------+-------------------+ +---------+--------+---+--------+--+---------+ VertebralPSV cm/s113EDV cm/s13Antegrade +---------+--------+---+--------+--+---------+   Summary: Right Carotid: Velocities in the right ICA  are consistent with a 1-39% stenosis. Left Carotid: Velocities in the left ICA are consistent with a 1-39% stenosis. Vertebrals:  Bilateral vertebral arteries demonstrate antegrade flow. Subclavians: Right subclavian artery flow was disturbed. Normal flow              hemodynamics were seen in the left subclavian artery. *See table(s) above for measurements and observations.  Electronically signed by Antony Contras MD on 06/04/2022 at 11:53:07 AM.    Final    Medications:  magnesium sulfate bolus IVPB     thiamine (VITAMIN B1) injection Stopped (06/05/22 1059)    (feeding supplement) PROSource Plus  30 mL Oral BID BM   acetaminophen  1,000 mg Oral Q8H   vitamin C  1,000 mg Oral Daily   aspirin EC  81 mg Oral Daily   capsaicin   Topical BID   carvedilol  3.125 mg Oral BID WC   Chlorhexidine Gluconate Cloth  6 each Topical Q0600   darbepoetin (ARANESP) injection - DIALYSIS  200 mcg Intravenous Q Fri-HD   docusate sodium  100 mg Oral Daily   feeding supplement (NEPRO CARB STEADY)  237 mL Oral BID BM   heparin injection (subcutaneous)  5,000 Units Subcutaneous Q8H   hydrocortisone   Rectal BID   insulin aspart  0-9 Units Subcutaneous TID WC   levETIRAcetam  500 mg Oral QHS   losartan  100 mg Oral Daily   methylphenidate  5 mg Oral BID WC   mometasone-formoterol  2 puff Inhalation BID   nutrition supplement (JUVEN)  1 packet Oral BID BM   pantoprazole  40 mg Oral Daily   rosuvastatin  10 mg Oral Daily   sevelamer carbonate  0.8 g Oral TID WC   [START ON 06/10/2022] thiamine  100 mg Oral Daily   ticagrelor  90 mg Oral BID    Dialysis Orders: MWF - [place] 4 hrs 180NRE 350/700 88.5 kg 2.0 K/2.0 Ca UFP 4 No heparin - Mircera 200 mcg  IV q 2wks - last 04/29/22 - Venofer '100mg'$  IV X 5 doses-new order, hasn't been given yet - Hectorol 55mg IV TIW  Assessment/Plan: 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 DSharol Given Previously off antibiotics, resumed empirically with rising WBC. On  IV Vanc/Zosyn per primary.  2. Acute Metabolic Encephalopathy/Intermittent Confusion: Seroquel stopped, minimizing sedatives. CT head negative. Cefepime discontinued. Waxing and waning. Managed by primary. 3. ESRD: Continue HD on usual MWF schedule, tolerating well. Next HD Monday 4. Anemia of ESRD: Hgb 9.4 continue weekly Aranesp (206m q Fri). Was on IV iron, stopped 8/5 because of ongoing infection. 5. Secondary hyperparathyroidism : COrrCa high, VDRA on hold. Phos at goal. Continue Renvela as binder. 6. HTN/volume: BP controlled. Reports shortness of breath chronically but no volume overload on chest x-ray. UF with HD as tolerated. 7. Nutrition - Very low albumin, continue protein supplements.  8. T2DM: Per primary.  9. Hx CVA: On dual ASA/plavix, and Keppra. 10. Dispo: SNF placement.   OgLynnda ChildA-C [facility] 06/06/2022,10:09 AM

## 2022-06-06 NOTE — Progress Notes (Addendum)
Physical Therapy Treatment Patient Details Name: Carolyn Russo MRN: [id number] DOB: May 06, 1951 Today's Date: 06/06/2022   History of Present Illness 71 y/o female presented to ED on 05/10/22 for R foot infection after 1st ray amputation x 1 month ago. S/p R BKA on 7/26; Hospital course slowed by encephalopathy/AMS, with waxing and waning ability to prticipate in therapies; Imaging on 8/11 revealed Punctate focus of acute ischemia in the left corona radiata.  Small acute cortical infarct in the right parietal lobe;  PMH: diabetes, hypertension, hyperlipidemia, ESRD on HD MWF, hx of CVA 03/2021    PT Comments    Continuing work on functional mobility and activity tolerance;  Noting much improved mentation and ability to participate today, answered all questions (some related to mobility, others related to pt's preferences), sometimes needing incr time for answers; following all commands with incr time; Able to voice her needs, and assisted pt onto bed pan when she reported the need to have a BM; Max assist to roll, but with good reach/initiation of rolling with cues; Sat up to EOB with light mod assist initially for balance, progressed to minguard assist; Good L and R lateral leaning for placement and taking out sliding board; Performed alteral scoot transfer OOB to HD recliner on pt's R with +2 Max assist (and careful attention to steadying HD recliner)-- but noteworthy that Pt able to participate in problem-solving for R hand placement on board, and then on armrest as transfer progressed;   Today's session shows a much improved abiltiy to participate in PT; She also showed staying power with solid participation in a 45 minute session (an hour total today, with a 15 minute break as this PT got the HD recliner); I would like to see that she can consistently participate, and plan to see her for PT tomorrow (a non-HD day); Much improved, though, and it's worth giving AIR for post-acute rehab another look   Recommendations for follow up therapy are one component of a multi-disciplinary discharge planning process, led by the attending physician.  Recommendations may be updated based on patient status, additional functional criteria and insurance authorization.  Follow Up Recommendations  Acute inpatient rehab (3hours/day)     Assistance Recommended at Discharge Frequent or constant Supervision/Assistance  Patient can return home with the following Two people to help with walking and/or transfers;Two people to help with bathing/dressing/bathroom;Assistance with feeding;Direct supervision/assist for medications management;Direct supervision/assist for financial management;Assist for transportation   Equipment Recommendations  Hospital bed (mechanical lift)    Recommendations for Other Services       Precautions / Restrictions Precautions Precautions: Fall Restrictions RLE Weight Bearing: Non weight bearing Other Position/Activity Restrictions: recommend elevation of RLE     Mobility  Bed Mobility Overal bed mobility: Needs Assistance Bed Mobility: Rolling, Sidelying to Sit Rolling: Max assist Sidelying to sit: Mod assist       General bed mobility comments: Assisted pt in rolling for getting on and coming off of bedpan; Pushed up from R sidelying to sit with heavy mod (almost Max) assist of 2; noting better organization of trunk over her RUE as she initaited pushing up with her elbow    Transfers Overall transfer level: Needs assistance Equipment used: Sliding board Transfers: Bed to chair/wheelchair/BSC            Lateral/Scoot Transfers: Max assist, +2 physical assistance, +2 safety/equipment General transfer comment: Performed lateral scoot transfer bed to HD recliner on her R (with armrest down); very nice L lateral lean/elbow prop  with assist for R LE lift for sliding board placement; Pt able to participate in problem-solving for R hand placement on board, and then on  armrest as transfer progressed; 2 person Max assist to slide today, with careful attention to steadying the HD recliner    Ambulation/Gait                   Stairs             Wheelchair Mobility    Modified Rankin (Stroke Patients Only)       Balance     Sitting balance-Leahy Scale: Fair                                      Cognition Arousal/Alertness: Awake/alert Behavior During Therapy: Flat affect Overall Cognitive Status: Impaired/Different from baseline Area of Impairment: Memory, Following commands, Problem solving                     Memory: Decreased short-term memory Following Commands: Follows one step commands consistently, Follows one step commands with increased time     Problem Solving: Slow processing, Decreased initiation, Requires verbal cues, Requires tactile cues General Comments: Followed 100% of simple commands related to funcitonal mobility when given time; Able to interact, conversant, though with longer pauses        Exercises      General Comments General comments (skin integrity, edema, etc.): Pt's husband present and helpful during session; PT placed mepilex on a small area of pink skin L buttock near anal opening, RN notified      Pertinent Vitals/Pain Pain Assessment Pain Assessment: Faces Faces Pain Scale: Hurts little more Pain Location: Occasional grimace, and stating "my leg" with movement of RLE Pain Descriptors / Indicators: Guarding, Restless Pain Intervention(s): Monitored during session    Home Living                          Prior Function            PT Goals (current goals can now be found in the care plan section) Acute Rehab PT Goals Patient Stated Goal: Asked for her back to be scratched PT Goal Formulation: With patient/family Time For Goal Achievement: 06/20/22 Potential to Achieve Goals: Fair Progress towards PT goals: Progressing toward goals     Frequency    Min 2X/week      PT Plan Current plan remains appropriate    Co-evaluation              AM-PAC PT "6 Clicks" Mobility   Outcome Measure  Help needed turning from your back to your side while in a flat bed without using bedrails?: Total Help needed moving from lying on your back to sitting on the side of a flat bed without using bedrails?: Total Help needed moving to and from a bed to a chair (including a wheelchair)?: Total Help needed standing up from a chair using your arms (e.g., wheelchair or bedside chair)?: Total Help needed to walk in hospital room?: Total Help needed climbing 3-5 steps with a railing? : Total 6 Click Score: 6    End of Session Equipment Utilized During Treatment: Other (comment) (sliding board) Activity Tolerance: Patient tolerated treatment well Patient left: in chair;with call bell/phone within reach;with family/visitor present (to go to HD soon) Nurse Communication: Mobility status PT Visit Diagnosis:  Muscle weakness (generalized) (M62.81);Other abnormalities of gait and mobility (R26.89)     Time: 1154-1254 (minus approx 15 minutes getting the HD recliner)  PT Time Calculation (min) (ACUTE ONLY): 60 min  Charges:  $Therapeutic Activity: 38-52 mins                     Roney Marion, PT  Acute Rehabilitation Services Office [phone]    [name] 06/06/2022, 3:21 PM

## 2022-06-06 NOTE — Progress Notes (Addendum)
Received patient in bed to unit.  Alert and oriented.  Informed consent signed and in  chart.   Treatment initiated: 1330 Treatment completed: 1829  Patient tolerated well. Dialyzer and set changed out once d/t elevated a/v pressures. A/V ports pull and flush without difficulty. Pt wanting to end tx early d/t sacral and buttock discomfort sitting in chair. Phoned pt husband, telephone consent received from pt husband to end pt tx early. Transported back to the room  alert, confused and without acute distress.  Hand-off given to patient's nurse.   Access used: HD catheter right chest Access issues: reversed a/v lines d/t elevated a/v pressures  Total UF removed: 1.7 liters Medication(s) given: heparin  Post HD VS: 146/66 MAP 90 HR 79 Sat 100% on room air RR 22 Temp oral 97.7 Post HD weight: UTA d/t pt in recliner   [facility]

## 2022-06-06 NOTE — Progress Notes (Addendum)
Inpatient Rehab Admissions Coordinator:   Per updated therapy recommendations pt was rescreened by Shann Medal, PT, DPT.  Pt has made extremely minimal progress throughout 27 day hospitalization (progressed from requiring hoyer to transfer bed<>chair to max +2 assist for bed>chair).  Note imaging showing small acute infarcts, however neurology feels these do not explain her current deficits in mobility and arousal.  I do not feel that [facility] will approve CIR level therapy for this patient given slow/minimal progress and lack of "acceptable" diagnosis by their standards, and I do not feel we would be able to argue that stance.  I can certainly reassess after a few more therapy sessions to see if her progress continues to improve, but it appears workup essentially complete.  If she is medically ready to d/c in the next 2-3 days would proceed with SNF discharge as appears is the current plan.    Shann Medal, PT, DPT Admissions Coordinator 06/06/22  4:11 PM

## 2022-06-07 ENCOUNTER — Ambulatory Visit: Payer: Medicare PPO | Admitting: Physician Assistant

## 2022-06-07 DIAGNOSIS — Z794 Long term (current) use of insulin: Secondary | ICD-10-CM

## 2022-06-07 DIAGNOSIS — R112 Nausea with vomiting, unspecified: Secondary | ICD-10-CM

## 2022-06-07 DIAGNOSIS — N185 Chronic kidney disease, stage 5: Secondary | ICD-10-CM

## 2022-06-07 DIAGNOSIS — D72829 Elevated white blood cell count, unspecified: Secondary | ICD-10-CM | POA: Diagnosis not present

## 2022-06-07 DIAGNOSIS — L8990 Pressure ulcer of unspecified site, unspecified stage: Secondary | ICD-10-CM

## 2022-06-07 DIAGNOSIS — I6381 Other cerebral infarction due to occlusion or stenosis of small artery: Secondary | ICD-10-CM

## 2022-06-07 DIAGNOSIS — I739 Peripheral vascular disease, unspecified: Secondary | ICD-10-CM

## 2022-06-07 LAB — BASIC METABOLIC PANEL WITH GFR
Anion gap: 11 (ref 5–15)
BUN: 30 mg/dL — ABNORMAL HIGH (ref 8–23)
CO2: 27 mmol/L (ref 22–32)
Calcium: 9 mg/dL (ref 8.9–10.3)
Chloride: 100 mmol/L (ref 98–111)
Creatinine, Ser: 5.18 mg/dL — ABNORMAL HIGH (ref 0.44–1.00)
GFR, Estimated: 8 mL/min — ABNORMAL LOW
Glucose, Bld: 113 mg/dL — ABNORMAL HIGH (ref 70–99)
Potassium: 3.9 mmol/L (ref 3.5–5.1)
Sodium: 138 mmol/L (ref 135–145)

## 2022-06-07 LAB — CULTURE, BLOOD (ROUTINE X 2)
Culture: NO GROWTH
Culture: NO GROWTH
Special Requests: ADEQUATE

## 2022-06-07 LAB — CBC
HCT: 29.5 % — ABNORMAL LOW (ref 36.0–46.0)
Hemoglobin: 10.4 g/dL — ABNORMAL LOW (ref 12.0–15.0)
MCH: 32.1 pg (ref 26.0–34.0)
MCHC: 35.3 g/dL (ref 30.0–36.0)
MCV: 91 fL (ref 80.0–100.0)
Platelets: 390 10*3/uL (ref 150–400)
RBC: 3.24 MIL/uL — ABNORMAL LOW (ref 3.87–5.11)
RDW: 22.9 % — ABNORMAL HIGH (ref 11.5–15.5)
WBC: 12.2 10*3/uL — ABNORMAL HIGH (ref 4.0–10.5)
nRBC: 1.1 % — ABNORMAL HIGH (ref 0.0–0.2)

## 2022-06-07 LAB — GLUCOSE, CAPILLARY
Glucose-Capillary: 122 mg/dL — ABNORMAL HIGH (ref 70–99)
Glucose-Capillary: 136 mg/dL — ABNORMAL HIGH (ref 70–99)
Glucose-Capillary: 177 mg/dL — ABNORMAL HIGH (ref 70–99)
Glucose-Capillary: 138 mg/dL — ABNORMAL HIGH (ref 70–99)
Glucose-Capillary: 178 mg/dL — ABNORMAL HIGH (ref 70–99)

## 2022-06-07 MED ORDER — ORAL CARE MOUTH RINSE: 15.0000 mL | OROMUCOSAL | Status: DC | PRN

## 2022-06-07 MED ORDER — MELATONIN 3 MG PO TABS
3.0000 mg | ORAL_TABLET | Freq: Every day | ORAL | Status: DC
  Administered 2022-06-07 – 2022-06-10 (×4): 3 mg via ORAL
  Filled 2022-06-07 (×4): qty 1

## 2022-06-07 MED ORDER — ORAL CARE MOUTH RINSE
15.0000 mL | OROMUCOSAL | Status: DC
  Administered 2022-06-07 – 2022-06-10 (×12): 15 mL via OROMUCOSAL

## 2022-06-07 NOTE — Progress Notes (Signed)
Per [facility]- no air mattress is available at this time. They will let us know if anything changes.

## 2022-06-07 NOTE — Progress Notes (Signed)
PROGRESS NOTE    Carolyn Russo  WUJ:[id number] DOB: 1951/08/03 DOA: 05/10/2022 PCP: Carolyn Chard, MD  Chief Complaint  Patient presents with   Cellulitis    Brief Narrative:  Carolyn Russo is Carolyn Russo 71 y.o. female with Carolyn Russo history of ESRD on HD, CVA with left hemiplegia, seizures, diabetes, hypertension, hyperlipidemia, chronic systolic heart failure, PAD, GI bleed. Patient presented secondary to worsening right foot infection, found to have gangrene. Empiric antibiotics initiated and patient underwent Carolyn Russo right BKA. During hospitalization, course has been complicated by altered mental status, which is currently improving.  At this time, plan is for discharge to SNF, though Dr. Venetia Russo (husband) remains interested in inpatient rehab if this were to become an option.    Assessment & Plan:   Principal Problem:   Leukocytosis Active Problems:   Gangrene of right foot (HCC)   PAD (peripheral artery disease) (HCC)   Encephalopathy   Shortness of breath   Acute stroke due to ischemia [facility])   Chest pain   History of CVA (cerebrovascular accident)   Anemia in chronic kidney disease   ESRD on dialysis (Carolyn Russo)   Type 2 diabetes mellitus (HCC)   Obesity (BMI 30-39.9)   Pressure ulcer   Hypertensive disorder   Dyslipidemia (high LDL; low HDL)   CAD (coronary artery disease), native coronary artery   Asthma   Diabetic retinopathy (HCC)   Basal ganglia infarction (HCC)   Foot infection   Below-knee amputation of right lower extremity (HCC)   Seizures (HCC)   Assessment and Plan: * Leukocytosis Afebrile, currently on vanc/zosyn empirically -> monitoring off abx at this time Unclear cause at this time -> but gradually improving CXR 8/7 without acute findings Blood cx from 8/5 with bacillus species (suspected contaminant) Will repeat blood cx 8/10 NGx5   Gangrene of right foot (HCC) Empiric Vancomycin and Cefepime initiated on admission. Vascular and orthopedic surgery consulted.  Patient underwent right BKA on 7/26 with subsequent discontinuation of antibiotics. PT/OT consulted with recommendations for SNF, however family is hoping for possible discharge to CIR. Patient with some pain of her leg but manageable. Wound vacuum removed on 8/6 for evaluation for infection (per Dr. Sharol Russo on 8/7, no signs of infection).  PAD (peripheral artery disease) (HCC) plavix -> currently on aspirin and brilinta (see stroke)  Encephalopathy Initial workup without specific/reversible etiology identified. Possibly related to medication effect. Patient with slow but positive recovery of mental status function. On 8/8, patient found to be lethargic with Carolyn Russo blood sugar of 46. D50 Russo. Persistent stupor. Possible she could have had Carolyn Russo seizure from hypoglycemia with resultant post-ictal phase. Cefepime recently started which could also have contributed, but episode seems to have coincided with hypoglycemic episode. -ABG without hypercarbia -Ammonia 11 on 04/2022 -vitamin b12 wnl, folate wnl -EEG without seizures or epileptiform discharges -CT head without acute process -cefepime d/c'd -high dose thiamine -MRI with small acute infarcts in the L corona radiata and R parietal cortex -> per neurology, these don't explain encephalopathy, recommending LTM EEG for recurrent lethargy Her mental status has been consistently improved over the past several days  Acute stroke due to ischemia [facility]) MRI with small acute infarcts in L corona radiata and R parietal cortex Neurology c/s - likely due to small vessel disease Echo with EF 60-65%, no RWMA, grade 1 diastolic dysfunction , carotid US with 1-39% stenosis bilaterally, right subclavian flow disturbed, MRA brain without contrast severly motion degraded, grossly negative intracranial MRA for LVO A1c 4.6, lipid  panel PT/OT/SLP Neurology recommending DAPT with aspirin and brilintax30 days (discussed with Dr. Donzetta Russo, he's ok with continuing brilinta after 30  days).  Also recommending outpatient 30 day event monitor.   Shortness of breath Currently on RA CXR today favors atelectasis Per discussion with husband, this is chronic complaint, w/u further if recurrent or worsening - no complaints today  History of CVA (cerebrovascular accident) Resultant L hemiplegia, more notable weakness today per discussion with husband? ? Related to episode of hypoglycemia/AMS -> MRI as above  Chest pain Reproducible per previous provider  ESRD on dialysis [facility]) Renal c/s, appreciate assistance  Anemia in chronic kidney disease trend  Type 2 diabetes mellitus (Carolyn Russo) SSI, hold basal/bolus with episode of hypoglycemia causing confusion a1c 4.6 Would d/c diabetes meds at discharge with a1c 4.6  Obesity (BMI 30-39.9) noted  Pressure ulcer Low air loss bed Wound c/s     DVT prophylaxis: heparin Code Status: full Family Communication: husband Disposition:   Status is: Inpatient Remains inpatient appropriate because: pending further improvement    Consultants:  renal  Procedures:  Right BKA (7/26)  Antimicrobials:  Anti-infectives (From admission, onward)    Start     Dose/Rate Route Frequency Ordered Stop   05/31/22 1430  piperacillin-tazobactam (ZOSYN) IVPB 2.25 g        2.25 g 100 mL/hr over 30 Minutes Intravenous Every 8 hours 05/31/22 1354 06/05/22 2157   05/30/22 1200  vancomycin (VANCOREADY) IVPB 750 mg/150 mL  Status:  Discontinued        750 mg 150 mL/hr over 60 Minutes Intravenous Every M-W-F (Hemodialysis) 05/28/22 1754 06/04/22 1702   05/28/22 1845  vancomycin (VANCOREADY) IVPB 1250 mg/250 mL        1,250 mg 166.7 mL/hr over 90 Minutes Intravenous  Once 05/28/22 1754 05/28/22 2108   05/28/22 1845  ceFEPIme (MAXIPIME) 1 g in sodium chloride 0.9 % 100 mL IVPB  Status:  Discontinued        1 g 200 mL/hr over 30 Minutes Intravenous Every 24 hours 05/28/22 1754 05/31/22 1241   05/18/22 1300  ceFAZolin (ANCEF) IVPB 2g/100 mL  premix  Status:  Discontinued        2 g 200 mL/hr over 30 Minutes Intravenous Every 8 hours 05/18/22 1212 05/18/22 1233   05/14/22 1130  vancomycin (VANCOCIN) IVPB 1000 mg/200 mL premix        1,000 mg 200 mL/hr over 60 Minutes Intravenous  Once 05/14/22 1038 05/14/22 1429   05/11/22 1900  ceFEPIme (MAXIPIME) 1 g in sodium chloride 0.9 % 100 mL IVPB  Status:  Discontinued        1 g 200 mL/hr over 30 Minutes Intravenous Every 24 hours 05/10/22 1715 05/18/22 1633   05/11/22 1200  vancomycin (VANCOCIN) IVPB 1000 mg/200 mL premix  Status:  Discontinued        1,000 mg 200 mL/hr over 60 Minutes Intravenous Every M-W-F (Hemodialysis) 05/10/22 1715 05/19/22 0925   05/10/22 1730  vancomycin (VANCOREADY) IVPB 1750 mg/350 mL        1,750 mg 175 mL/hr over 120 Minutes Intravenous  Once 05/10/22 1715 05/10/22 2151   05/10/22 1730  ceFEPIme (MAXIPIME) 2 g in sodium chloride 0.9 % 100 mL IVPB        2 g 200 mL/hr over 30 Minutes Intravenous  Once 05/10/22 1715 05/10/22 1840       Subjective: No new complaints Discussed with husband over phone  Objective: Vitals:   06/07/22 1136 06/07/22 1359 06/07/22  1618 06/07/22 1711  BP: (!) 161/71 (!) 142/47 (!) 158/78 (!) 157/63  Pulse: 87 79 77 75  Resp: '16 14 20 18  '$ Temp: 98 F (36.7 C)   98.4 F (36.9 C)  TempSrc: Oral     SpO2: 99% 99% 100% 100%  Weight:      Height:        Intake/Output Summary (Last 24 hours) at 06/07/2022 1931 Last data filed at 06/07/2022 0929 Gross per 24 hour  Intake 360 ml  Output --  Net 360 ml    Filed Weights   06/03/22 0717 06/03/22 0729 06/03/22 1139  Weight: 80.9 kg 80.9 kg 78.9 kg    Examination:  General: No acute distress. Cardiovascular: RRR Lungs: unlabored Abdomen: Soft, nontender, nondistended  Neurological: alert, moving all extremities Extremities: R BKA    Data Reviewed: I have personally reviewed following labs and imaging studies  CBC: Recent Labs  Lab 06/02/22 0702  06/03/22 0138 06/04/22 0132 06/05/22 0112 06/06/22 0328 06/07/22 0907  WBC 16.4* 16.1* 15.2* 14.0* 13.5* 12.2*  NEUTROABS 11.5* 10.5* 9.4* 6.9 6.6  --   HGB 9.0* 9.2* 8.9* 8.7* 9.4* 10.4*  HCT 25.6* 26.5* 25.2* 24.6* 27.8* 29.5*  MCV 88.3 88.9 90.0 89.1 91.1 91.0  PLT 339 329 341 341 377 245    Basic Metabolic Panel: Recent Labs  Lab 06/02/22 0702 06/03/22 0138 06/04/22 0132 06/05/22 0112 06/06/22 0328 06/07/22 0907  NA 131* 128* 133* 135 134* 138  K 3.4* 3.6 3.6 3.8 3.9 3.9  CL 92* 92* 94* 95* 93* 100  CO2 '24 23 26 25 25 27  '$ GLUCOSE 82 116* 178* 234* 174* 113*  BUN 26* 41* 25* 42* 57* 30*  CREATININE 4.58* 5.79* 3.81* 5.80* 7.55* 5.18*  CALCIUM 8.4* 8.4* 8.4* 8.8* 8.9 9.0  MG 1.6* 1.6*  --  1.5* 1.8  --   PHOS 3.8 4.8*  --  5.2* 5.8*  --     GFR: Estimated Creatinine Clearance: 10 mL/min (Jt Brabec) (by C-G formula based on SCr of 5.18 mg/dL (H)).  Liver Function Tests: Recent Labs  Lab 06/02/22 0702 06/03/22 0138 06/04/22 0132 06/05/22 0112 06/06/22 0328  AST 15 14* 15 13* 13*  ALT '15 15 14 14 12  '$ ALKPHOS 58 52 64 59 61  BILITOT 0.9 0.8 0.7 0.5 0.6  PROT 6.1* 5.9* 5.6* 5.6* 5.7*  ALBUMIN 2.3* 2.4* 2.2* 2.3* 2.4*    CBG: Recent Labs  Lab 06/06/22 1937 06/07/22 0742 06/07/22 0748 06/07/22 1130 06/07/22 1748  GLUCAP 174* 122* 136* 177* 138*     Recent Results (from the past 240 hour(s))  Culture, blood (Routine X 2) w Reflex to ID Panel     Status: None   Collection Time: 06/02/22  7:02 AM   Specimen: BLOOD LEFT HAND  Result Value Ref Range Status   Specimen Description BLOOD LEFT HAND  Final   Special Requests   Final    BOTTLES DRAWN AEROBIC AND ANAEROBIC Blood Culture adequate volume   Culture   Final    NO GROWTH 5 DAYS Performed at [facility], [place] [address], [place], [place] [address]    Report Status 06/07/2022 FINAL  Final  Culture, blood (Routine X 2) w Reflex to ID Panel     Status: None   Collection Time: 06/02/22  7:08 AM    Specimen: BLOOD LEFT HAND  Result Value Ref Range Status   Specimen Description BLOOD LEFT HAND  Final   Special Requests  Final    BOTTLES DRAWN AEROBIC AND ANAEROBIC Blood Culture results may not be optimal due to an inadequate volume of blood received in culture bottles   Culture   Final    NO GROWTH 5 DAYS Performed at [facility], [place] [address], [place], [place] [address]    Report Status 06/07/2022 FINAL  Final         Radiology Studies: No results found.      Scheduled Meds:  (feeding supplement) PROSource Plus  30 mL Oral BID BM   acetaminophen  1,000 mg Oral Q8H   vitamin C  1,000 mg Oral Daily   aspirin EC  81 mg Oral Daily   capsaicin   Topical BID   carvedilol  3.125 mg Oral BID WC   Chlorhexidine Gluconate Cloth  6 each Topical Q0600   darbepoetin (ARANESP) injection - DIALYSIS  200 mcg Intravenous Q Fri-HD   docusate sodium  100 mg Oral Daily   feeding supplement (NEPRO CARB STEADY)  237 mL Oral BID BM   heparin injection (subcutaneous)  5,000 Units Subcutaneous Q8H   hydrocortisone   Rectal BID   insulin aspart  0-9 Units Subcutaneous TID WC   levETIRAcetam  500 mg Oral QHS   losartan  100 mg Oral Daily   melatonin  3 mg Oral QHS   methylphenidate  5 mg Oral BID WC   mometasone-formoterol  2 puff Inhalation BID   nutrition supplement (JUVEN)  1 packet Oral BID BM   mouth rinse  15 mL Mouth Rinse 4 times per day   pantoprazole  40 mg Oral Daily   rosuvastatin  10 mg Oral Daily   sevelamer carbonate  0.8 g Oral TID WC   [START ON 06/10/2022] thiamine  100 mg Oral Daily   ticagrelor  90 mg Oral BID   Continuous Infusions:  magnesium sulfate bolus IVPB     thiamine (VITAMIN B1) injection 250 mg (06/07/22 0929)     LOS: 28 days    Time spent: over 30 min    Fayrene Helper, MD [facility]   To contact the attending provider between 7A-7P or the covering provider during after hours 7P-7A, please log into the web site  [email or web address] and access using universal [place] password for that web site. If you do not have the password, please call the hospital operator.  06/07/2022, 7:31 PM

## 2022-06-07 NOTE — Progress Notes (Signed)
[facility] Progress Note   Subjective:   Patient seen and examined in room. Patient reports that she tolerated HD yesterday. Documentation reveals that she had terminated treatment early given sacral discomfort. Net UF 1.7L. No acute events overnight.   Objective Vitals:   06/06/22 2321 06/07/22 0440 06/07/22 0746 06/07/22 0747  BP: 137/78 (!) 155/95  (!) 161/64  Pulse: 88 89  84  Resp: '20 18  18  '$ Temp: 98.2 F (36.8 C) 98.2 F (36.8 C)  98.3 F (36.8 C)  TempSrc: Oral Oral    SpO2: 100% 100% 100% 98%  Weight:      Height:       Physical Exam General: Alert female in NAD Heart: RRR, no murmurs, rubs or gallops Lungs: CTA bilaterally without wheezing, rhonchi or rales Abdomen: Soft, non-distended, +BS Extremities: No edema b/l lower extremities Dialysis Access: RIJ [facility]  Additional Objective Labs: Basic Metabolic Panel: Recent Labs  Lab 06/03/22 0138 06/04/22 0132 06/05/22 0112 06/06/22 0328 06/07/22 0907  NA 128*   < > 135 134* 138  K 3.6   < > 3.8 3.9 3.9  CL 92*   < > 95* 93* 100  CO2 23   < > '25 25 27  '$ GLUCOSE 116*   < > 234* 174* 113*  BUN 41*   < > 42* 57* 30*  CREATININE 5.79*   < > 5.80* 7.55* 5.18*  CALCIUM 8.4*   < > 8.8* 8.9 9.0  PHOS 4.8*  --  5.2* 5.8*  --    < > = values in this interval not displayed.   Liver Function Tests: Recent Labs  Lab 06/04/22 0132 06/05/22 0112 06/06/22 0328  AST 15 13* 13*  ALT '14 14 12  '$ ALKPHOS 64 59 61  BILITOT 0.7 0.5 0.6  PROT 5.6* 5.6* 5.7*  ALBUMIN 2.2* 2.3* 2.4*   No results for input(s): "LIPASE", "AMYLASE" in the last 168 hours. CBC: Recent Labs  Lab 06/03/22 0138 06/04/22 0132 06/05/22 0112 06/06/22 0328 06/07/22 0907  WBC 16.1* 15.2* 14.0* 13.5* 12.2*  NEUTROABS 10.5* 9.4* 6.9 6.6  --   HGB 9.2* 8.9* 8.7* 9.4* 10.4*  HCT 26.5* 25.2* 24.6* 27.8* 29.5*  MCV 88.9 90.0 89.1 91.1 91.0  PLT 329 341 341 377 390   Blood Culture    Component Value Date/Time   SDES BLOOD LEFT  HAND 06/02/2022 0708   SPECREQUEST  06/02/2022 0708    BOTTLES DRAWN AEROBIC AND ANAEROBIC Blood Culture results may not be optimal due to an inadequate volume of blood received in culture bottles   CULT  06/02/2022 0708    NO GROWTH 5 DAYS Performed at [facility], [place] [address], [place], [place] [address]    REPTSTATUS 06/07/2022 FINAL 06/02/2022 0708    Cardiac Enzymes: No results for input(s): "CKTOTAL", "CKMB", "CKMBINDEX", "TROPONINI" in the last 168 hours. CBG: Recent Labs  Lab 06/06/22 1144 06/06/22 1908 06/06/22 1937 06/07/22 0742 06/07/22 0748  GLUCAP 222* 210* 174* 122* 136*   Iron Studies: No results for input(s): "IRON", "TIBC", "TRANSFERRIN", "FERRITIN" in the last 72 hours. '@lablastinr3'$ @ Studies/Results: No results found. Medications:  magnesium sulfate bolus IVPB     thiamine (VITAMIN B1) injection 250 mg (06/07/22 0929)    (feeding supplement) PROSource Plus  30 mL Oral BID BM   acetaminophen  1,000 mg Oral Q8H   vitamin C  1,000 mg Oral Daily   aspirin EC  81 mg Oral Daily   capsaicin  Topical BID   carvedilol  3.125 mg Oral BID WC   Chlorhexidine Gluconate Cloth  6 each Topical Q0600   darbepoetin (ARANESP) injection - DIALYSIS  200 mcg Intravenous Q Fri-HD   docusate sodium  100 mg Oral Daily   feeding supplement (NEPRO CARB STEADY)  237 mL Oral BID BM   heparin injection (subcutaneous)  5,000 Units Subcutaneous Q8H   hydrocortisone   Rectal BID   insulin aspart  0-9 Units Subcutaneous TID WC   levETIRAcetam  500 mg Oral QHS   losartan  100 mg Oral Daily   methylphenidate  5 mg Oral BID WC   mometasone-formoterol  2 puff Inhalation BID   nutrition supplement (JUVEN)  1 packet Oral BID BM   mouth rinse  15 mL Mouth Rinse 4 times per day   pantoprazole  40 mg Oral Daily   rosuvastatin  10 mg Oral Daily   sevelamer carbonate  0.8 g Oral TID WC   [START ON 06/10/2022] thiamine  100 mg Oral Daily   ticagrelor  90 mg Oral BID    Dialysis  Orders: MWF - [facility] 4 hrs 180NRE 350/700 88.5 kg 2.0 K/2.0 Ca UFP 4 No heparin - Mircera 200 mcg  IV q 2wks - last 04/29/22 - Venofer '100mg'$  IV X 5 doses-new order, hasn't been given yet - Hectorol 27mg IV TIW  Assessment/Plan: 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 DSharol Given Previously off antibiotics, resumed empirically with rising WBC. S/p abx. 2. Acute Metabolic Encephalopathy/Intermittent Confusion: Seroquel stopped, minimizing sedatives. CT head negative. Cefepime discontinued. Waxing and waning. Managed by primary. 3. ESRD: Continue HD on usual MWF schedule 4. Anemia of ESRD: Hgb 10.4 continue weekly Aranesp (2031m q Fri). Was on IV iron, stopped 8/5 because of ongoing infection. Infection now resolved, can utilize IV Fe if needed 5. Secondary hyperparathyroidism : COrrCa high, VDRA on hold. Phos at goal. Continue Renvela as binder. 6. HTN/volume: BP controlled. Reports shortness of breath chronically but no volume overload on chest x-ray. UF with HD as tolerated. 7. Nutrition, protein calorie malnutrition: continue protein supplements.  8. T2DM: Per primary.  9. Hx CVA: On dual ASA/plavix, and Keppra. 10. Dispo: SNF placement.   ViGean QuintMD [facility]

## 2022-06-07 NOTE — Assessment & Plan Note (Signed)
noted 

## 2022-06-07 NOTE — Plan of Care (Signed)
No acute events overnight.

## 2022-06-07 NOTE — Progress Notes (Signed)
Physical Therapy Treatment Patient Details Name: Carolyn Russo MRN: [id number] DOB: 02-22-1951 Today's Date: 06/07/2022   History of Present Illness 71 y/o female presented to ED on 05/10/22 for R foot infection after 1st ray amputation x 1 month ago. S/p R BKA on 7/26; Hospital course slowed by encephalopathy/AMS, with waxing and waning ability to prticipate in therapies; Imaging on 8/11 revealed Punctate focus of acute ischemia in the left corona radiata.  Small acute cortical infarct in the right parietal lobe;  PMH: diabetes, hypertension, hyperlipidemia, ESRD on HD MWF, hx of CVA 03/2021    PT Comments    Continuing work on functional mobility and activity tolerance;  more alert and interactive today though increased time required for pt responses. Session focused on trial of AP transfer to recliner with good effort noted on pt part. However, unable to fully complete AP transfer to chair d/t pt discomfort in peri region with scooting increasing pain levels. Pt's husband present, supportive and excited to see pt more vocal today.     Recommendations for follow up therapy are one component of a multi-disciplinary discharge planning process, led by the attending physician.  Recommendations may be updated based on patient status, additional functional criteria and insurance authorization.  Follow Up Recommendations  Acute inpatient rehab (3hours/day) Can patient physically be transported by private vehicle: No   Assistance Recommended at Discharge Frequent or constant Supervision/Assistance  Patient can return home with the following Two people to help with walking and/or transfers;Two people to help with bathing/dressing/bathroom;Assistance with feeding;Direct supervision/assist for medications management;Direct supervision/assist for financial management;Assist for transportation   Equipment Recommendations  Hospital bed (mechanical lift)    Recommendations for Other Services        Precautions / Restrictions Precautions Precautions: Fall Restrictions Weight Bearing Restrictions: Yes RLE Weight Bearing: Non weight bearing     Mobility  Bed Mobility Overal bed mobility: Needs Assistance Bed Mobility: Rolling, Supine to Sit, Sit to Supine Rolling: Mod assist Sidelying to sit: Mod assist Supine to sit: +2 for physical assistance, Max assist Sit to supine: Min assist   General bed mobility comments: Pt able to pull self to long sitting and transition to EOB with Max A x 2 in prep for AP transfer. significant cues needed to complete task with assist to scoot hips needed. Pt able to lay self back to bed with no more than Min A at end of session. Overall Mod A to roll for placement of bed pads and hygiene at end of session    Transfers Overall transfer level: Needs assistance   Transfers: Bed to chair/wheelchair/BSC             General transfer comment: Attempted AP transfer to recliner with pt initially reporting bottom discomfort when therapists pulling on bed pad. Attempted initiation of lateral leans/scooting one hip at a time with increased time/effort needed and pt continuing to report bottom discomfort. unable to fully transfer to chair d/t discomfort and fatigue    Ambulation/Gait                   Stairs             Wheelchair Mobility    Modified Rankin (Stroke Patients Only)       Balance     Sitting balance-Leahy Scale: Fair  Cognition Arousal/Alertness: Awake/alert Behavior During Therapy: Flat affect Overall Cognitive Status: Impaired/Different from baseline Area of Impairment: Memory, Following commands, Problem solving, Awareness, Attention                   Current Attention Level: Selective Memory: Decreased short-term memory Following Commands: Follows one step commands consistently, Follows one step commands with increased time   Awareness:  Intellectual Problem Solving: Slow processing, Decreased initiation, Requires verbal cues, Requires tactile cues General Comments: more interactive than with previous PT sessions. with increased time, follows commands though benefits from multimodal cues for more complex instructions. delayed responses but able to accurately express needs "scratch my back" or needing barrier cream for her bottom. flat affect overall        Exercises      General Comments General comments (skin integrity, edema, etc.): Pt's husband present and supportve      Pertinent Vitals/Pain Pain Assessment Pain Assessment: Faces Faces Pain Scale: Hurts little more Pain Location: bottom Pain Descriptors / Indicators: Grimacing, Sore Pain Intervention(s): Monitored during session, Repositioned (NT aware adn present to apply barrier cream)    Home Living                          Prior Function            PT Goals (current goals can now be found in the care plan section) Acute Rehab PT Goals Patient Stated Goal: Asked for her back to be scratched PT Goal Formulation: With patient/family Time For Goal Achievement: 06/20/22 Potential to Achieve Goals: Fair Progress towards PT goals: Progressing toward goals    Frequency    Min 2X/week      PT Plan Current plan remains appropriate    Co-evaluation PT/OT/SLP Co-Evaluation/Treatment: Yes Reason for Co-Treatment: For patient/therapist safety;To address functional/ADL transfers (to trial AP transfer) PT goals addressed during session: Mobility/safety with mobility OT goals addressed during session: ADL's and self-care;Strengthening/ROM      AM-PAC PT "6 Clicks" Mobility   Outcome Measure  Help needed turning from your back to your side while in a flat bed without using bedrails?: Total Help needed moving from lying on your back to sitting on the side of a flat bed without using bedrails?: Total Help needed moving to and from a bed to a  chair (including a wheelchair)?: Total Help needed standing up from a chair using your arms (e.g., wheelchair or bedside chair)?: Total Help needed to walk in hospital room?: Total Help needed climbing 3-5 steps with a railing? : Total 6 Click Score: 6    End of Session         PT Visit Diagnosis: Muscle weakness (generalized) (M62.81);Other abnormalities of gait and mobility (R26.89)     Time: 3893-7342 PT Time Calculation (min) (ACUTE ONLY): 32 min  Charges:  $Therapeutic Activity: 8-22 mins                     Roney Marion, PT  Acute Rehabilitation Services Office [phone]    [name] 06/07/2022, 2:04 PM

## 2022-06-07 NOTE — Plan of Care (Signed)
  Problem: Nutritional: Goal: Maintenance of adequate nutrition will improve Outcome: Progressing   Problem: Coping: Goal: Level of anxiety will decrease Outcome: Progressing   Problem: Elimination: Goal: Will not experience complications related to bowel motility Outcome: Progressing   Problem: Pain Managment: Goal: General experience of comfort will improve Outcome: Progressing

## 2022-06-07 NOTE — Progress Notes (Signed)
Mobility Specialist Progress Note   06/07/22 1138  Mobility  Activity Turned to right side;Turned to back - supine  Level of Assistance +2 (takes two people)  Assistive Device  (HHA)  Activity Response Tolerated well  $Mobility charge 1 Mobility   RN requesting to help reposition pt to prevent bed sores an apply a more comfort. Slight cognitive delay but following cues. Pt requiring minA to roll in bed and attend to bottom side. Left back supine w/ needs met and call bell by side.  Holland Falling Mobility Specialist MS [facility] #:  [phone] Acute Rehab Office:  [phone]

## 2022-06-07 NOTE — TOC Progression Note (Addendum)
Transition of Care [facility]) - Progression Note    Patient Details  Name: Carolyn Russo MRN: [id number] Date of Birth: 1951-05-18  Transition of Care [facility]) CM/SW Contact  Joanne Chars, LCSW Phone Number: 06/07/2022, 11:45 AM  Clinical Narrative:  CSW spoke with pt husband regarding DC plan.  Discussed CIR note from yesterday that pt currently not a candidate for CIR.  He confirmed that if no CIR, he still does want to move forward with SNF at [facility].     1315: auth request submitted in [place]  Expected Discharge Plan: [place] (vs CIR vs SNF) Barriers to Discharge: Continued Medical Work up  Expected Discharge Plan and Services Expected Discharge Plan: [place] (vs CIR vs SNF)   Discharge Planning Services: CM Consult                               HH Arranged: RN, PT, Speech Therapy (PTA active with [place]) [place]: [place] Determinants of Health (SDOH) Interventions    Readmission Risk Interventions     No data to display

## 2022-06-07 NOTE — Progress Notes (Signed)
Occupational Therapy Treatment Patient Details Name: Carolyn Russo MRN: [id number] DOB: 09-13-1951 Today's Date: 06/07/2022   History of present illness 71 y/o female presented to ED on 05/10/22 for R foot infection after 1st ray amputation x 1 month ago. S/p R BKA on 7/26; Hospital course slowed by encephalopathy/AMS, with waxing and waning ability to prticipate in therapies; Imaging on 8/11 revealed Punctate focus of acute ischemia in the left corona radiata.  Small acute cortical infarct in the right parietal lobe;  PMH: diabetes, hypertension, hyperlipidemia, ESRD on HD MWF, hx of CVA 03/2021   OT comments  Pt with incremental progress towards OT goals, more alert and interactive today though increased time required for pt responses. Session focused on trial of AP transfer to recliner with good effort noted on pt part. However, unable to fully complete AP transfer to chair d/t pt discomfort in peri region with scooting increasing pain levels. Pt's husband present, supportive and excited to see pt more vocal today. Continue to rec SNF rehab at DC.    Recommendations for follow up therapy are one component of a multi-disciplinary discharge planning process, led by the attending physician.  Recommendations may be updated based on patient status, additional functional criteria and insurance authorization.    Follow Up Recommendations  Skilled nursing-short term rehab (<3 hours/day)    Assistance Recommended at Discharge Frequent or constant Supervision/Assistance  Patient can return home with the following  Two people to help with bathing/dressing/bathroom;Two people to help with walking and/or transfers;Assistance with feeding;Assistance with cooking/housework;Assist for transportation;Help with stairs or ramp for entrance;Direct supervision/assist for financial management;Direct supervision/assist for medications management   Equipment Recommendations  Hospital bed    Recommendations for Other  Services      Precautions / Restrictions Precautions Precautions: Fall Restrictions Weight Bearing Restrictions: Yes RLE Weight Bearing: Non weight bearing       Mobility Bed Mobility Overal bed mobility: Needs Assistance Bed Mobility: Rolling, Supine to Sit, Sit to Supine Rolling: Mod assist   Supine to sit: +2 for physical assistance, Max assist Sit to supine: Min assist   General bed mobility comments: Pt able to pull self to long sitting and transition to EOB with Max A x 2 in prep for AP transfer. significant cues needed to complete task with assist to scoot hips needed. Pt able to lay self back to bed with no more than Min A at end of session. Overall Mod A to roll for placement of bed pads and hygiene at end of session    Transfers Overall transfer level: Needs assistance                 General transfer comment: Attempted AP transfer to recliner with pt initially reporting bottom discomfort when therapists pulling on bed pad. Attempted initiation of lateral leans/scooting one hip at a time with increased time/effort needed and pt continuing to report bottom discomfort. unable to fully transfer to chair d/t discomfort and fatigue     Balance Overall balance assessment: Needs assistance Sitting-balance support: Bilateral upper extremity supported, Single extremity supported Sitting balance-Leahy Scale: Fair                                     ADL either performed or assessed with clinical judgement   ADL Overall ADL's : Needs assistance/impaired  General ADL Comments: Emphasis on sequencing basic mobility tasks, alertness and trial of AP transfer to chair    Extremity/Trunk Assessment Upper Extremity Assessment Upper Extremity Assessment: RUE deficits/detail;LUE deficits/detail RUE Deficits / Details: hx of deficits from HD cath placement with coordination deficits at baseline RUE  Coordination: decreased fine motor;decreased gross motor LUE Deficits / Details: hx of CVA affecting this UE, husband reports this became pt's dominant UE after HD cath impacted R UE function. able to straighten elbow. shoulder weakness noted, able to actively flex to 20*, PROM to 90* LUE Coordination: decreased fine motor;decreased gross motor   Lower Extremity Assessment Lower Extremity Assessment: Defer to PT evaluation        Vision   Vision Assessment?: No apparent visual deficits   Perception     Praxis      Cognition Arousal/Alertness: Awake/alert Behavior During Therapy: Flat affect Overall Cognitive Status: Impaired/Different from baseline Area of Impairment: Memory, Following commands, Problem solving, Awareness, Attention                   Current Attention Level: Selective Memory: Decreased short-term memory Following Commands: Follows one step commands consistently, Follows one step commands with increased time   Awareness: Intellectual Problem Solving: Slow processing, Decreased initiation, Requires verbal cues, Requires tactile cues General Comments: more interactive than with previous OT sessions. with increased time, follows commands though benefits from multimodal cues for more complex instructions. delayed responses but able to accurately express needs "scratch my back" or needing barrier cream for her bottom. flat affect overall        Exercises      Shoulder Instructions       General Comments Pt's husband present and supportve    Pertinent Vitals/ Pain       Pain Assessment Pain Assessment: Faces Faces Pain Scale: Hurts little more Pain Location: bottom Pain Descriptors / Indicators: Grimacing, Sore Pain Intervention(s): Monitored during session, Repositioned, Other (comment) (NT aware and present to apply barrier cream)  Home Living                                          Prior Functioning/Environment               Frequency  Min 2X/week        Progress Toward Goals  OT Goals(current goals can now be found in the care plan section)  Progress towards OT goals: Progressing toward goals  Acute Rehab OT Goals Patient Stated Goal: pain control of bottom OT Goal Formulation: Patient unable to participate in goal setting Time For Goal Achievement: 06/16/22 Potential to Achieve Goals: [place] Discharge plan remains appropriate;Frequency remains appropriate    Co-evaluation    PT/OT/SLP Co-Evaluation/Treatment: Yes Reason for Co-Treatment: For patient/therapist safety;To address functional/ADL transfers;Other (comment) (to trial AP transfer abilities)   OT goals addressed during session: ADL's and self-care;Strengthening/ROM      AM-PAC OT "6 Clicks" Daily Activity     Outcome Measure   Help from another person eating meals?: A Lot Help from another person taking care of personal grooming?: Total Help from another person toileting, which includes using toliet, bedpan, or urinal?: Total Help from another person bathing (including washing, rinsing, drying)?: Total Help from another person to put on and taking off regular upper body clothing?: Total Help from another person to put on and taking off regular lower  body clothing?: Total 6 Click Score: 7    End of Session    OT Visit Diagnosis: Unsteadiness on feet (R26.81);Muscle weakness (generalized) (M62.81)   Activity Tolerance Patient tolerated treatment well;Patient limited by fatigue   Patient Left in bed;with call bell/phone within reach;with nursing/sitter in room;with family/visitor present   Nurse Communication          Time: 2712-9290 OT Time Calculation (min): 34 min  Charges: OT General Charges $OT Visit: 1 Visit OT Treatments $Therapeutic Activity: 8-22 mins  Malachy Chamber, OTR/L Acute Rehab Services Office: [phone]   Layla Maw 06/07/2022, 1:02 PM

## 2022-06-07 NOTE — Assessment & Plan Note (Signed)
Low air loss bed Wound c/s

## 2022-06-08 ENCOUNTER — Other Ambulatory Visit: Payer: Self-pay | Admitting: Cardiology

## 2022-06-08 DIAGNOSIS — I639 Cerebral infarction, unspecified: Secondary | ICD-10-CM

## 2022-06-08 DIAGNOSIS — D72829 Elevated white blood cell count, unspecified: Secondary | ICD-10-CM | POA: Diagnosis not present

## 2022-06-08 LAB — CBC WITH DIFFERENTIAL/PLATELET
Abs Immature Granulocytes: 0.05 10*3/uL (ref 0.00–0.07)
Basophils Absolute: 0.1 10*3/uL (ref 0.0–0.1)
Basophils Relative: 1 %
Eosinophils Absolute: 0.4 10*3/uL (ref 0.0–0.5)
Eosinophils Relative: 3 %
HCT: 26.7 % — ABNORMAL LOW (ref 36.0–46.0)
Hemoglobin: 9.1 g/dL — ABNORMAL LOW (ref 12.0–15.0)
Immature Granulocytes: 1 %
Lymphocytes Relative: 30 %
Lymphs Abs: 3.4 10*3/uL (ref 0.7–4.0)
MCH: 31.9 pg (ref 26.0–34.0)
MCHC: 34.1 g/dL (ref 30.0–36.0)
MCV: 93.7 fL (ref 80.0–100.0)
Monocytes Absolute: 1.8 10*3/uL — ABNORMAL HIGH (ref 0.1–1.0)
Monocytes Relative: 16 %
Neutro Abs: 5.4 10*3/uL (ref 1.7–7.7)
Neutrophils Relative %: 49 %
Platelets: 381 10*3/uL (ref 150–400)
RBC: 2.85 MIL/uL — ABNORMAL LOW (ref 3.87–5.11)
RDW: 22.7 % — ABNORMAL HIGH (ref 11.5–15.5)
WBC: 11.1 10*3/uL — ABNORMAL HIGH (ref 4.0–10.5)
nRBC: 1.1 % — ABNORMAL HIGH (ref 0.0–0.2)

## 2022-06-08 LAB — GLUCOSE, CAPILLARY
Glucose-Capillary: 130 mg/dL — ABNORMAL HIGH (ref 70–99)
Glucose-Capillary: 110 mg/dL — ABNORMAL HIGH (ref 70–99)
Glucose-Capillary: 197 mg/dL — ABNORMAL HIGH (ref 70–99)
Glucose-Capillary: 249 mg/dL — ABNORMAL HIGH (ref 70–99)

## 2022-06-08 LAB — COMPREHENSIVE METABOLIC PANEL
ALT: 15 U/L (ref 0–44)
AST: 16 U/L (ref 15–41)
Albumin: 2.4 g/dL — ABNORMAL LOW (ref 3.5–5.0)
Alkaline Phosphatase: 64 U/L (ref 38–126)
Anion gap: 11 (ref 5–15)
BUN: 43 mg/dL — ABNORMAL HIGH (ref 8–23)
CO2: 27 mmol/L (ref 22–32)
Calcium: 9 mg/dL (ref 8.9–10.3)
Chloride: 100 mmol/L (ref 98–111)
Creatinine, Ser: 6.72 mg/dL — ABNORMAL HIGH (ref 0.44–1.00)
GFR, Estimated: 6 mL/min — ABNORMAL LOW (ref >60)
Glucose, Bld: 184 mg/dL — ABNORMAL HIGH (ref 70–99)
Potassium: 4.3 mmol/L (ref 3.5–5.1)
Sodium: 138 mmol/L (ref 135–145)
Total Bilirubin: 0.8 mg/dL (ref 0.3–1.2)
Total Protein: 5.7 g/dL — ABNORMAL LOW (ref 6.5–8.1)

## 2022-06-08 LAB — PHOSPHORUS: Phosphorus: 4.1 mg/dL (ref 2.5–4.6)

## 2022-06-08 LAB — MAGNESIUM: Magnesium: 1.8 mg/dL (ref 1.7–2.4)

## 2022-06-08 MED ORDER — ALTEPLASE 2 MG IJ SOLR: 2.0000 mg | Freq: Once | INTRAMUSCULAR | Status: DC | PRN

## 2022-06-08 MED ORDER — TRAZODONE HCL 50 MG PO TABS
25.0000 mg | ORAL_TABLET | Freq: Once | ORAL | Status: AC
  Administered 2022-06-08: 25 mg via ORAL
  Filled 2022-06-08: qty 1

## 2022-06-08 MED ORDER — HEPARIN SODIUM (PORCINE) 1000 UNIT/ML IJ SOLN
INTRAMUSCULAR | Status: AC
  Filled 2022-06-08: qty 3

## 2022-06-08 MED ORDER — ANTICOAGULANT SODIUM CITRATE 4% (200MG/5ML) IV SOLN
5.0000 mL | Status: DC | PRN
  Administered 2022-06-08: 5 mL
  Filled 2022-06-08 (×2): qty 5

## 2022-06-08 MED ORDER — HEPARIN SODIUM (PORCINE) 1000 UNIT/ML IJ SOLN
INTRAMUSCULAR | Status: AC
  Administered 2022-06-08: 3000 [IU]
  Filled 2022-06-08: qty 3

## 2022-06-08 NOTE — Plan of Care (Signed)
  Problem: Pain Managment: Goal: General experience of comfort will improve Outcome: Progressing   Problem: Safety: Goal: Ability to remain free from injury will improve Outcome: Progressing   

## 2022-06-08 NOTE — Progress Notes (Signed)
Received patient in bed to unit.  Alert confused and anxiouis Informed consent signed and in  chart.   Treatment initiated: 0730 Treatment completed: 1132  Patient tolerated well.  Transported back to the room on 2 liters nasal cannula Alert confused calm and without acute distress.  Hand-off given to patient's nurse.   Access used: HD catheter left chest Access issues: a/v blood lines reversed d/t elevated a/v pressures  Total UF removed:  Medication(s) given: sodium citrate, tylenol, oxycodone, ritalin Post HD VS: 126/62 MAP 79 HR 72 RR 16 Sat 100% on 2 liters nasal cannula Temp oral 98.3 Post HD weight: 80.0 kg bed wt   [facility]

## 2022-06-08 NOTE — Progress Notes (Addendum)
Paged and informed dr Candiss Norse of pt relentless c/o of chest pain and right leg pain, informed stopped uf and applied oxygen 2 liters nasal cannula.  Will leave UF off and get ekg for primary to review per md. Primary md arrived later to bedside, this rn notes pt does not report any discomforts to md. This rn informed primary md of pt relentless c/o of chest pain and right leg pain, md states will review ekg and pain managment

## 2022-06-08 NOTE — Progress Notes (Signed)
PROGRESS NOTE    Carolyn Russo  HYQ:[id number] DOB: 07/21/1951 DOA: 05/10/2022 PCP: [name], MD   Brief Narrative:  This 71 y.o. female with a history of ESRD on HD, CVA with left hemiplegia, seizures, diabetes, hypertension, hyperlipidemia, chronic systolic heart failure, PAD, GI bleed. Patient presented secondary to worsening right foot infection, found to have gangrene. Empiric antibiotics initiated and patient underwent  right BKA. During hospitalization, course has been complicated by altered mental status, which is currently improving.  At this time, plan is for discharge to SNF, though Dr. [name] (husband) remains interested in inpatient rehab if this were to become an option.     Assessment & Plan:   Principal Problem:   Leukocytosis Active Problems:   Gangrene of right foot (HCC)   PAD (peripheral artery disease) (HCC)   Encephalopathy   Shortness of breath   Acute stroke due to ischemia [facility])   Chest pain   History of CVA (cerebrovascular accident)   Anemia in chronic kidney disease   ESRD on dialysis ([employer])   Type 2 diabetes mellitus (HCC)   Obesity (BMI 30-39.9)   Pressure ulcer   Hypertensive disorder   Dyslipidemia (high LDL; low HDL)   CAD (coronary artery disease), native coronary artery   Asthma   Diabetic retinopathy (HCC)   Basal ganglia infarction ([place])   Foot infection   Below-knee amputation of right lower extremity (HCC)   Seizures (HCC)   Leukocytosis: Patient remains afebrile, initiated on Vanco and Zosyn empirically. Monitoring off antibiotic at this time. Unclear cause at this time -> but gradually improving Chest x-ray without acute findings. Blood cx from 8/5 with bacillus species (suspected contaminant) Repeat blood cultures on 8/10 NGTD   Gangerous right foot: Initiated on empiric vancomycin and cefepime on admission. Vascular and orthopedic surgery consulted.  Patient underwent right BKA on 7/26 with subsequent discontinuation  of antibiotics.  PT/OT consulted with recommendations for SNF, however family is hoping for possible discharge to CIR.  Patient reports pain is manageable.. Wound vacuum removed on 8/6 for evaluation for infection (per Dr. Sharol Given on 8/7, no signs of infection).   Peripheral arterial disease: Continue aspirin and Brilinta.   Metabolic Encephalopathy: Multifactorial Initial workup No etiology identified. Possibly related to medication effect.  Patient was also found to be lethargic with a blood glucose of 46 on 08/08. Possible she could have had a seizure from hypoglycemia with resultant post-ictal phase.  Cefepime recently started which could also have contributed, but episode seems to have coincided with hypoglycemic episode. -ABG without hypercarbia. -Ammonia 11 on 04/2022 -vitamin b12 wnl, folate wnl -EEG without seizures or epileptiform discharges -CT head without acute process -cefepime d/c'd -high dose thiamine -MRI with small acute infarcts in the L corona radiata and R parietal cortex.  Neurology  thinks these don't explain encephalopathy, recommending LTM EEG for recurrent lethargy  Her mental status has been consistently improved over the past several days   Acute stroke due to ischemia [facility]) MRI with small acute infarcts in L corona radiata and R parietal cortex Neurology c/s - likely due to small vessel disease Echo with EF 60-65%, no RWMA, grade 1 diastolic dysfunction , Carotid US with 1-39% stenosis bilaterally, right subclavian flow disturbed, MRA brain without contrast severly motion degraded, grossly negative intracranial MRA for LVO. A1c 4.6, lipid panel PT/OT/SLP Neurology recommending DAPT with aspirin and brilinta x 30 days (discussed with Dr. [name], he's ok with continuing brilinta after 30 days).  Also recommending outpatient  30 day event monitor.   Shortness of breath Per discussion with husband, this is chronic complaint, Chest x-ray without abnormalities.    History of CVA (cerebrovascular accident) Resultant L hemiplegia, more notable weakness  per discussion with husband? ? Related to episode of hypoglycemia/AMS -> MRI as above.   Chest pain Reproducible , obtain EKG and troponin.   ESRD on dialysis [facility]) Renal c/s, appreciate assistance.   Anemia in chronic kidney disease H&H remains stable.   Type 2 diabetes mellitus (HCC) Hold insulin, due to episodes of hypoglycemia. a1c 4.6 Discontinue antidiabetic medication due to hemoglobin A1c 4.1.   Obesity (BMI 30-39.9) Diet and exercise discussed in detail.   Pressure ulcer: Low air loss bed Wound c/s   DVT prophylaxis: Heparin Code Status: Full code Family Communication: No family at bed side Disposition Plan:   Status is: Inpatient Remains inpatient appropriate because: Admitted for gangrenous foot underwent right BKA tolerated well getting hemodialysis as per ESRD schedule.   Consultants:  Podiatry Nephrology  Procedures: Right BKA  antimicrobials:  Anti-infectives (From admission, onward)    Start     Dose/Rate Route Frequency Ordered Stop   05/31/22 1430  piperacillin-tazobactam (ZOSYN) IVPB 2.25 g        2.25 g 100 mL/hr over 30 Minutes Intravenous Every 8 hours 05/31/22 1354 06/05/22 2157   05/30/22 1200  vancomycin (VANCOREADY) IVPB 750 mg/150 mL  Status:  Discontinued        750 mg 150 mL/hr over 60 Minutes Intravenous Every M-W-F (Hemodialysis) 05/28/22 1754 06/04/22 1702   05/28/22 1845  vancomycin (VANCOREADY) IVPB 1250 mg/250 mL        1,250 mg 166.7 mL/hr over 90 Minutes Intravenous  Once 05/28/22 1754 05/28/22 2108   05/28/22 1845  ceFEPIme (MAXIPIME) 1 g in sodium chloride 0.9 % 100 mL IVPB  Status:  Discontinued        1 g 200 mL/hr over 30 Minutes Intravenous Every 24 hours 05/28/22 1754 05/31/22 1241   05/18/22 1300  ceFAZolin (ANCEF) IVPB 2g/100 mL premix  Status:  Discontinued        2 g 200 mL/hr over 30 Minutes Intravenous Every 8 hours  05/18/22 1212 05/18/22 1233   05/14/22 1130  vancomycin (VANCOCIN) IVPB 1000 mg/200 mL premix        1,000 mg 200 mL/hr over 60 Minutes Intravenous  Once 05/14/22 1038 05/14/22 1429   05/11/22 1900  ceFEPIme (MAXIPIME) 1 g in sodium chloride 0.9 % 100 mL IVPB  Status:  Discontinued        1 g 200 mL/hr over 30 Minutes Intravenous Every 24 hours 05/10/22 1715 05/18/22 1633   05/11/22 1200  vancomycin (VANCOCIN) IVPB 1000 mg/200 mL premix  Status:  Discontinued        1,000 mg 200 mL/hr over 60 Minutes Intravenous Every M-W-F (Hemodialysis) 05/10/22 1715 05/19/22 0925   05/10/22 1730  vancomycin (VANCOREADY) IVPB 1750 mg/350 mL        1,750 mg 175 mL/hr over 120 Minutes Intravenous  Once 05/10/22 1715 05/10/22 2151   05/10/22 1730  ceFEPIme (MAXIPIME) 2 g in sodium chloride 0.9 % 100 mL IVPB        2 g 200 mL/hr over 30 Minutes Intravenous  Once 05/10/22 1715 05/10/22 1840        Subjective: Patient was seen and examined at bedside.  Overnight events noted.   Patient seems confused.  During hemodialysis complains of having chest pain and back pain.  Objective:  Vitals:   06/08/22 1000 06/08/22 1030 06/08/22 1100 06/08/22 1209  BP: 125/68 129/60 (!) 139/56 (!) 142/60  Pulse: 63 66 69 93  Resp: '20 18 19 17  '$ Temp:    (!) 97.5 F (36.4 C)  TempSrc:    Oral  SpO2: 100% 100% 100% 100%  Weight:      Height:        Intake/Output Summary (Last 24 hours) at 06/08/2022 1622 Last data filed at 06/08/2022 1500 Gross per 24 hour  Intake 175 ml  Output --  Net 175 ml   Filed Weights   06/03/22 0717 06/03/22 0729 06/03/22 1139  Weight: 80.9 kg 80.9 kg 78.9 kg    Examination:  General exam: Appears deconditioned, comfortable, not in any acute distress. Respiratory system: CTA bilaterally, no wheezing, no crackles, normal respiratory effort. Cardiovascular system: S1 & S2 heard, regular rate and rhythm, no murmur. Gastrointestinal system: Abdomen is soft, non tender, non distended,  BS+ Central nervous system: Alert and oriented x 1. No focal neurological deficits. Extremities: Right BKA , dressing noted, dried blood noted. Skin: No rashes, lesions or ulcers Psychiatry: Mood & affect appropriate.     Data Reviewed: I have personally reviewed following labs and imaging studies  CBC: Recent Labs  Lab 06/03/22 0138 06/04/22 0132 06/05/22 0112 06/06/22 0328 06/07/22 0907 06/08/22 0340  WBC 16.1* 15.2* 14.0* 13.5* 12.2* 11.1*  NEUTROABS 10.5* 9.4* 6.9 6.6  --  5.4  HGB 9.2* 8.9* 8.7* 9.4* 10.4* 9.1*  HCT 26.5* 25.2* 24.6* 27.8* 29.5* 26.7*  MCV 88.9 90.0 89.1 91.1 91.0 93.7  PLT 329 341 341 377 390 517   Basic Metabolic Panel: Recent Labs  Lab 06/02/22 0702 06/03/22 0138 06/04/22 0132 06/05/22 0112 06/06/22 0328 06/07/22 0907 06/08/22 0340  NA 131* 128* 133* 135 134* 138 138  K 3.4* 3.6 3.6 3.8 3.9 3.9 4.3  CL 92* 92* 94* 95* 93* 100 100  CO2 '24 23 26 25 25 27 27  '$ GLUCOSE 82 116* 178* 234* 174* 113* 184*  BUN 26* 41* 25* 42* 57* 30* 43*  CREATININE 4.58* 5.79* 3.81* 5.80* 7.55* 5.18* 6.72*  CALCIUM 8.4* 8.4* 8.4* 8.8* 8.9 9.0 9.0  MG 1.6* 1.6*  --  1.5* 1.8  --  1.8  PHOS 3.8 4.8*  --  5.2* 5.8*  --  4.1   GFR: Estimated Creatinine Clearance: 7.7 mL/min (A) (by C-G formula based on SCr of 6.72 mg/dL (H)). Liver Function Tests: Recent Labs  Lab 06/03/22 0138 06/04/22 0132 06/05/22 0112 06/06/22 0328 06/08/22 0340  AST 14* 15 13* 13* 16  ALT '15 14 14 12 15  '$ ALKPHOS 52 64 59 61 64  BILITOT 0.8 0.7 0.5 0.6 0.8  PROT 5.9* 5.6* 5.6* 5.7* 5.7*  ALBUMIN 2.4* 2.2* 2.3* 2.4* 2.4*   No results for input(s): "LIPASE", "AMYLASE" in the last 168 hours. No results for input(s): "AMMONIA" in the last 168 hours. Coagulation Profile: No results for input(s): "INR", "PROTIME" in the last 168 hours. Cardiac Enzymes: No results for input(s): "CKTOTAL", "CKMB", "CKMBINDEX", "TROPONINI" in the last 168 hours. BNP (last 3 results) No results for  input(s): "PROBNP" in the last 8760 hours. HbA1C: No results for input(s): "HGBA1C" in the last 72 hours. CBG: Recent Labs  Lab 06/07/22 1130 06/07/22 1748 06/07/22 2035 06/08/22 0643 06/08/22 1227  GLUCAP 177* 138* 178* 130* 110*   Lipid Profile: No results for input(s): "CHOL", "HDL", "LDLCALC", "TRIG", "CHOLHDL", "LDLDIRECT" in the last 72 hours. Thyroid Function  Tests: No results for input(s): "TSH", "T4TOTAL", "FREET4", "T3FREE", "THYROIDAB" in the last 72 hours. Anemia Panel: No results for input(s): "VITAMINB12", "FOLATE", "FERRITIN", "TIBC", "IRON", "RETICCTPCT" in the last 72 hours. Sepsis Labs: No results for input(s): "PROCALCITON", "LATICACIDVEN" in the last 168 hours.  Recent Results (from the past 240 hour(s))  Culture, blood (Routine X 2) w Reflex to ID Panel     Status: None   Collection Time: 06/02/22  7:02 AM   Specimen: BLOOD LEFT HAND  Result Value Ref Range Status   Specimen Description BLOOD LEFT HAND  Final   Special Requests   Final    BOTTLES DRAWN AEROBIC AND ANAEROBIC Blood Culture adequate volume   Culture   Final    NO GROWTH 5 DAYS Performed at [facility], [address] [place], [place] [address]    Report Status 06/07/2022 FINAL  Final  Culture, blood (Routine X 2) w Reflex to ID Panel     Status: None   Collection Time: 06/02/22  7:08 AM   Specimen: BLOOD LEFT HAND  Result Value Ref Range Status   Specimen Description BLOOD LEFT HAND  Final   Special Requests   Final    BOTTLES DRAWN AEROBIC AND ANAEROBIC Blood Culture results may not be optimal due to an inadequate volume of blood received in culture bottles   Culture   Final    NO GROWTH 5 DAYS Performed at [facility], [place] [address] [place], [place] [address]    Report Status 06/07/2022 FINAL  Final    Radiology Studies: No results found.  Scheduled Meds:  (feeding supplement) PROSource Plus  30 mL Oral BID BM   acetaminophen  1,000 mg Oral Q8H   vitamin C   1,000 mg Oral Daily   aspirin EC  81 mg Oral Daily   capsaicin   Topical BID   carvedilol  3.125 mg Oral BID WC   Chlorhexidine Gluconate Cloth  6 each Topical Q0600   darbepoetin (ARANESP) injection - DIALYSIS  200 mcg Intravenous Q Fri-HD   docusate sodium  100 mg Oral Daily   feeding supplement (NEPRO CARB STEADY)  237 mL Oral BID BM   heparin injection (subcutaneous)  5,000 Units Subcutaneous Q8H   heparin sodium (porcine)       hydrocortisone   Rectal BID   insulin aspart  0-9 Units Subcutaneous TID WC   levETIRAcetam  500 mg Oral QHS   losartan  100 mg Oral Daily   melatonin  3 mg Oral QHS   methylphenidate  5 mg Oral BID WC   mometasone-formoterol  2 puff Inhalation BID   nutrition supplement (JUVEN)  1 packet Oral BID BM   mouth rinse  15 mL Mouth Rinse 4 times per day   pantoprazole  40 mg Oral Daily   rosuvastatin  10 mg Oral Daily   sevelamer carbonate  0.8 g Oral TID WC   [START ON 06/10/2022] thiamine  100 mg Oral Daily   ticagrelor  90 mg Oral BID   Continuous Infusions:  magnesium sulfate bolus IVPB     thiamine (VITAMIN B1) injection 250 mg (06/08/22 1609)     LOS: 29 days    Time spent: 50 mins    Symphony Demuro, MD [facility]   If 7PM-7AM, please contact night-coverage

## 2022-06-08 NOTE — Consult Note (Signed)
[place] Nurse wound consult note Consultation was completed by review of records, images and assistance from the bedside nurse/clinical staff.   Reason for Consult: pressure injury Wound type: moisture associated skin damage  Pressure Injury POA: NA Measurement: see nursing flow sheet Wound DIX:BOER, clean Drainage (amount, consistency, odor) none Periwound: intact  Dressing procedure/placement/frequency:  Continue silicone foam per nursing skin care order set. Change every 3 days and PRN soilage.   Discussed POC with bedside nurse.  Re consult if needed, will not follow at this time. Thanks  Shatiqua Heroux [employer], RN,CWOCN, CNS, [place] [phone])

## 2022-06-08 NOTE — Progress Notes (Signed)
[facility] Progress Note   Subjective:   Patient seen and examined on dialysis. Reporting leg pain, then later sacral pain. I was later called about patient reporting chest pain, primary MD to evaluate. VSS, UFG 3L.   Objective Vitals:   06/08/22 0730 06/08/22 0800 06/08/22 0830 06/08/22 0900  BP: 136/77 (!) 145/66 (!) 99/53 (!) 100/48  Pulse: 75 73 70 63  Resp: '13 15 16 16  '$ Temp:      TempSrc:      SpO2: 100% 100% 100% 99%  Weight:      Height:       Physical Exam General: Alert female in NAD, laying flat in bed Heart: RRR, no murmurs, rubs or gallops Lungs: CTA bilaterally without wheezing, rhonchi or rales Abdomen: Soft, non-distended, +BS Extremities: No significant edema b/l lower extremities Dialysis Access: RIJ [facility]  Additional Objective Labs: Basic Metabolic Panel: Recent Labs  Lab 06/05/22 0112 06/06/22 0328 06/07/22 0907 06/08/22 0340  NA 135 134* 138 138  K 3.8 3.9 3.9 4.3  CL 95* 93* 100 100  CO2 '25 25 27 27  '$ GLUCOSE 234* 174* 113* 184*  BUN 42* 57* 30* 43*  CREATININE 5.80* 7.55* 5.18* 6.72*  CALCIUM 8.8* 8.9 9.0 9.0  PHOS 5.2* 5.8*  --  4.1   Liver Function Tests: Recent Labs  Lab 06/05/22 0112 06/06/22 0328 06/08/22 0340  AST 13* 13* 16  ALT '14 12 15  '$ ALKPHOS 59 61 64  BILITOT 0.5 0.6 0.8  PROT 5.6* 5.7* 5.7*  ALBUMIN 2.3* 2.4* 2.4*   No results for input(s): "LIPASE", "AMYLASE" in the last 168 hours. CBC: Recent Labs  Lab 06/04/22 0132 06/05/22 0112 06/06/22 0328 06/07/22 0907 06/08/22 0340  WBC 15.2* 14.0* 13.5* 12.2* 11.1*  NEUTROABS 9.4* 6.9 6.6  --  5.4  HGB 8.9* 8.7* 9.4* 10.4* 9.1*  HCT 25.2* 24.6* 27.8* 29.5* 26.7*  MCV 90.0 89.1 91.1 91.0 93.7  PLT 341 341 377 390 381   Blood Culture    Component Value Date/Time   SDES BLOOD LEFT HAND 06/02/2022 0708   SPECREQUEST  06/02/2022 0708    BOTTLES DRAWN AEROBIC AND ANAEROBIC Blood Culture results may not be optimal due to an inadequate volume of blood  received in culture bottles   CULT  06/02/2022 0708    NO GROWTH 5 DAYS Performed at [facility], [place] [address], [place], [place] [address]    REPTSTATUS 06/07/2022 FINAL 06/02/2022 0708    Cardiac Enzymes: No results for input(s): "CKTOTAL", "CKMB", "CKMBINDEX", "TROPONINI" in the last 168 hours. CBG: Recent Labs  Lab 06/07/22 0748 06/07/22 1130 06/07/22 1748 06/07/22 2035 06/08/22 0643  GLUCAP 136* 177* 138* 178* 130*   Iron Studies: No results for input(s): "IRON", "TIBC", "TRANSFERRIN", "FERRITIN" in the last 72 hours. '@lablastinr3'$ @ Studies/Results: No results found. Medications:  anticoagulant sodium citrate     magnesium sulfate bolus IVPB     thiamine (VITAMIN B1) injection 250 mg (06/07/22 0929)    (feeding supplement) PROSource Plus  30 mL Oral BID BM   acetaminophen  1,000 mg Oral Q8H   vitamin C  1,000 mg Oral Daily   aspirin EC  81 mg Oral Daily   capsaicin   Topical BID   carvedilol  3.125 mg Oral BID WC   Chlorhexidine Gluconate Cloth  6 each Topical Q0600   darbepoetin (ARANESP) injection - DIALYSIS  200 mcg Intravenous Q Fri-HD   docusate sodium  100 mg Oral Daily  feeding supplement (NEPRO CARB STEADY)  237 mL Oral BID BM   heparin injection (subcutaneous)  5,000 Units Subcutaneous Q8H   hydrocortisone   Rectal BID   insulin aspart  0-9 Units Subcutaneous TID WC   levETIRAcetam  500 mg Oral QHS   losartan  100 mg Oral Daily   melatonin  3 mg Oral QHS   methylphenidate  5 mg Oral BID WC   mometasone-formoterol  2 puff Inhalation BID   nutrition supplement (JUVEN)  1 packet Oral BID BM   mouth rinse  15 mL Mouth Rinse 4 times per day   pantoprazole  40 mg Oral Daily   rosuvastatin  10 mg Oral Daily   sevelamer carbonate  0.8 g Oral TID WC   [START ON 06/10/2022] thiamine  100 mg Oral Daily   ticagrelor  90 mg Oral BID    Dialysis Orders: MWF - [place] 4 hrs 180NRE 350/700 88.5 kg 2.0 K/2.0 Ca UFP 4 No heparin - Mircera  200 mcg  IV q 2wks - last 04/29/22 - Venofer '100mg'$  IV X 5 doses-new order, hasn't been given yet - Hectorol 24mg IV TIW  Assessment/Plan: 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 DSharol Given Previously off antibiotics, resumed empirically with rising WBC. S/p abx. Preferred narcotic agents for pain control are hydromorphone, fentanyl, and methadone. Morphine should not be used. Avoid Baclofen 2. Acute Metabolic Encephalopathy/Intermittent Confusion: Seroquel stopped, minimizing sedatives. CT head negative. Cefepime discontinued. Waxing and waning. Managed by primary. 3. ESRD: Continue HD on usual MWF schedule 4. Anemia of ESRD: continue weekly Aranesp (2071m q Fri). Was on IV iron, stopped 8/5 because of ongoing infection. Infection now resolved, can utilize IV Fe if needed. Hgb 9.1 5. Secondary hyperparathyroidism : COrrCa high, VDRA on hold. Phos at goal. Continue Renvela as binder. 6. HTN/volume: BP controlled. Reports shortness of breath chronically but no volume overload on chest x-ray. UF with HD as tolerated. 7. Nutrition, protein calorie malnutrition: continue protein supplements.  8. T2DM: Per primary.  9. Hx CVA: On dual ASA/plavix, and Keppra. 10. Dispo: SNF placement.    ViGean QuintMD [facility]

## 2022-06-08 NOTE — Progress Notes (Signed)
[facility] ([facility]) Hospital Liaison Note  Notified by TOC manager of patient/family request for [facility] palliative services at SNF after discharge.   [facility] hospital liaison will follow patient for discharge disposition.   Please call with any hospice or outpatient palliative care related questions.   Thank you for the opportunity to participate in this patient's care.   Shanita Wicker, LCSW [facility] Hospital Liaison [phone]  

## 2022-06-08 NOTE — TOC Progression Note (Signed)
Transition of Care [facility]) - Progression Note    Patient Details  Name: Carolyn Russo MRN: [id number] Date of Birth: 08-17-1951  Transition [facility] CM/SW Contact  Joanne Chars, LCSW Phone Number: 06/08/2022, 8:47 AM  Clinical Narrative:   Auth approved in [place]: [id number], [id number], 3 days: 8/16-8/18    Expected Discharge Plan: [place] (vs CIR vs SNF) Barriers to Discharge: Continued Medical Work up  Expected Discharge Plan and Services Expected Discharge Plan: [place] (vs CIR vs SNF)   Discharge Planning Services: CM Consult                               HH Arranged: RN, PT, Speech Therapy (PTA active with [place]) [place]: [facility] [place] Determinants of Health (SDOH) Interventions    Readmission Risk Interventions     No data to display

## 2022-06-08 NOTE — Progress Notes (Signed)
Dr Candiss Norse at bedside, informed md of sbp 90s-100, will keep MAP greater than 65 per md

## 2022-06-09 LAB — GLUCOSE, CAPILLARY
Glucose-Capillary: 148 mg/dL — ABNORMAL HIGH (ref 70–99)
Glucose-Capillary: 242 mg/dL — ABNORMAL HIGH (ref 70–99)
Glucose-Capillary: 190 mg/dL — ABNORMAL HIGH (ref 70–99)
Glucose-Capillary: 287 mg/dL — ABNORMAL HIGH (ref 70–99)
Glucose-Capillary: 311 mg/dL — ABNORMAL HIGH (ref 70–99)

## 2022-06-09 LAB — BASIC METABOLIC PANEL
Anion gap: 10 (ref 5–15)
BUN: 20 mg/dL (ref 8–23)
CO2: 28 mmol/L (ref 22–32)
Calcium: 8.6 mg/dL — ABNORMAL LOW (ref 8.9–10.3)
Chloride: 97 mmol/L — ABNORMAL LOW (ref 98–111)
Creatinine, Ser: 3.99 mg/dL — ABNORMAL HIGH (ref 0.44–1.00)
GFR, Estimated: 11 mL/min — ABNORMAL LOW (ref >60)
Glucose, Bld: 240 mg/dL — ABNORMAL HIGH (ref 70–99)
Potassium: 4.2 mmol/L (ref 3.5–5.1)
Sodium: 135 mmol/L (ref 135–145)

## 2022-06-09 MED ORDER — INSULIN ASPART 100 UNIT/ML IJ SOLN
7.0000 [IU] | Freq: Once | INTRAMUSCULAR | Status: AC
Start: 2022-06-09 — End: 2022-06-09
  Administered 2022-06-09: 7 [IU] via SUBCUTANEOUS

## 2022-06-09 NOTE — Progress Notes (Signed)
Continue to follow pt's case. Reviewed PT/OT notes. Pt appears weak per notes and trial of HD in chair may need to be completed prior to d/c. Pt will need to be able to tolerate being in a w/c and chair to out-pt HD clinic, treatment time, and return to snf. Will assist as needed.      Melven Sartorius Renal Navigator [phone]

## 2022-06-09 NOTE — Progress Notes (Signed)
Occupational Therapy Treatment Patient Details Name: Carolyn Russo MRN: [id number] DOB: 1951-06-26 Today's Date: 06/09/2022   History of present illness 71 y/o female presented to ED on 05/10/22 for R foot infection after 1st ray amputation x 1 month ago. S/p R BKA on 7/26; Hospital course slowed by encephalopathy/AMS, with waxing and waning ability to prticipate in therapies; Imaging on 8/11 revealed Punctate focus of acute ischemia in the left corona radiata.  Small acute cortical infarct in the right parietal lobe;  PMH: diabetes, hypertension, hyperlipidemia, ESRD on HD MWF, hx of CVA 03/2021   OT comments  Pt initially alert and engaged in conversation at start of session. However, with initiation of activity, pt with poor motor planning/initiation and fatigued quickly. Pt requires overall Total A x 2 for bed mobility and lateral scooting at bedside. Due to deficits, pt continues to require extensive assist for UB/LB ADLs. Continue to rec SNF rehab.    Recommendations for follow up therapy are one component of a multi-disciplinary discharge planning process, led by the attending physician.  Recommendations may be updated based on patient status, additional functional criteria and insurance authorization.    Follow Up Recommendations  Skilled nursing-short term rehab (<3 hours/day)    Assistance Recommended at Discharge Frequent or constant Supervision/Assistance  Patient can return home with the following  Two people to help with bathing/dressing/bathroom;Two people to help with walking and/or transfers;Assistance with feeding;Assistance with cooking/housework;Assist for transportation;Help with stairs or ramp for entrance;Direct supervision/assist for financial management;Direct supervision/assist for medications management   Equipment Recommendations  Hospital bed    Recommendations for Other Services      Precautions / Restrictions Precautions Precautions: Fall Precaution Comments:  limb protector Restrictions Weight Bearing Restrictions: Yes RLE Weight Bearing: Non weight bearing       Mobility Bed Mobility Overal bed mobility: Needs Assistance Bed Mobility: Rolling, Supine to Sit, Sit to Supine Rolling: Total assist   Supine to sit: Total assist, +2 for physical assistance, +2 for safety/equipment, HOB elevated Sit to supine: Total assist, +2 for physical assistance, +2 for safety/equipment   General bed mobility comments: poor motor planning and initiation, often reaching to opposite side that pt was to transfer to. Total A for rolling side to side for bed pad change and placement of pillow for pressure relief    Transfers Overall transfer level: Needs assistance                Lateral/Scoot Transfers: Total assist, +2 physical assistance, +2 safety/equipment General transfer comment: Total A x 2 for lateral scooting along bedside, poor follow through of pushing through BUE and unable to lift bottom without extensive assist     Balance Overall balance assessment: Needs assistance Sitting-balance support: Bilateral upper extremity supported, Single extremity supported Sitting balance-Leahy Scale: Fair                                     ADL either performed or assessed with clinical judgement   ADL Overall ADL's : Needs assistance/impaired     Grooming: Maximal assistance;Bed level Grooming Details (indicate cue type and reason): to don bonnet over head. pt observed to scratch head with RUE though declined ability to place bonnet over head Upper Body Bathing: Maximal assistance;Sitting Upper Body Bathing Details (indicate cue type and reason): simulated EOB, assist to apply lotion to back/scratch for pt         Lower  Body Dressing: Total assistance;Bed level Lower Body Dressing Details (indicate cue type and reason): donning/adjusting limb protector               General ADL Comments: Continues to have slow processing,  delayed initation and impaired motor planning with extensive assist for ADLs/scooting EOB attempts    Extremity/Trunk Assessment Upper Extremity Assessment Upper Extremity Assessment: RUE deficits/detail;Difficult to assess due to impaired cognition RUE Deficits / Details: hx of deficits from HD cath placement with coordination deficits at baseline RUE Coordination: decreased fine motor;decreased gross motor LUE Deficits / Details: hx of CVA affecting this UE, husband reports this became pt's dominant UE after HD cath impacted R UE function. able to straighten elbow. shoulder weakness noted, able to actively flex to 20*, PROM to 90* LUE Coordination: decreased fine motor;decreased gross motor   Lower Extremity Assessment Lower Extremity Assessment: Defer to PT evaluation        Vision   Vision Assessment?: No apparent visual deficits   Perception     Praxis      Cognition Arousal/Alertness: Awake/alert Behavior During Therapy: Flat affect Overall Cognitive Status: Impaired/Different from baseline Area of Impairment: Memory, Following commands, Problem solving, Awareness, Attention                   Current Attention Level: Selective Memory: Decreased short-term memory Following Commands: Follows one step commands with increased time, Follows one step commands inconsistently   Awareness: Intellectual Problem Solving: Slow processing, Decreased initiation, Requires verbal cues, Requires tactile cues General Comments: initially alert and interactive, conversation appropriate though pt perseverating on having someone scratch her back. when initiating movement, pt requires max multimocal cues to do so with poor motor planning. pt became lethargic quickly with activity        Exercises      Shoulder Instructions       General Comments Pt resting with R residual limb in flexion. educated on need for LE straight to prevent contracture formation - applied limb protector     Pertinent Vitals/ Pain       Pain Assessment Pain Assessment: Faces Faces Pain Scale: Hurts a little bit Pain Location: bottom Pain Descriptors / Indicators: Grimacing, Sore Pain Intervention(s): Monitored during session, Repositioned (pillow under side for pressure relief)  Home Living                                          Prior Functioning/Environment              Frequency  Min 2X/week        Progress Toward Goals  OT Goals(current goals can now be found in the care plan section)  Progress towards OT goals: OT to reassess next treatment  Acute Rehab OT Goals Patient Stated Goal: rest, for bottom to stop hurting OT Goal Formulation: Patient unable to participate in goal setting Time For Goal Achievement: 06/16/22 Potential to Achieve Goals: Fair ADL Goals Pt Will Perform Eating: with min assist;sitting;with adaptive utensils Pt Will Perform Grooming: with mod assist;sitting Pt Will Perform Upper Body Bathing: with min assist;sitting Pt Will Perform Upper Body Dressing: with mod assist;sitting Pt Will Transfer to Toilet: bedside commode;with max assist Pt Will Perform Toileting - Clothing Manipulation and hygiene: with max assist;sitting/lateral leans Additional ADL Goal #1: Pt to increase bed mobility to Mod A in prep for ADLs  Plan Discharge plan  remains appropriate;Frequency remains appropriate    Co-evaluation    PT/OT/SLP Co-Evaluation/Treatment: Yes Reason for Co-Treatment: Necessary to address cognition/behavior during functional activity;To address functional/ADL transfers PT goals addressed during session: Mobility/safety with mobility OT goals addressed during session: ADL's and self-care;Strengthening/ROM      AM-PAC OT "6 Clicks" Daily Activity     Outcome Measure   Help from another person eating meals?: A Lot Help from another person taking care of personal grooming?: A Lot Help from another person toileting, which  includes using toliet, bedpan, or urinal?: Total Help from another person bathing (including washing, rinsing, drying)?: Total Help from another person to put on and taking off regular upper body clothing?: Total Help from another person to put on and taking off regular lower body clothing?: Total 6 Click Score: 8    End of Session    OT Visit Diagnosis: Unsteadiness on feet (R26.81);Muscle weakness (generalized) (M62.81)   Activity Tolerance Patient limited by fatigue;Patient limited by lethargy   Patient Left in bed;with call bell/phone within reach;with bed alarm set   Nurse Communication          Time: 6606-0045 OT Time Calculation (min): 24 min  Charges: OT General Charges $OT Visit: 1 Visit OT Treatments $Therapeutic Activity: 8-22 mins  Malachy Chamber, OTR/L Acute Rehab Services Office: [phone]   Layla Maw 06/09/2022, 12:27 PM

## 2022-06-09 NOTE — Progress Notes (Signed)
Physical Therapy Treatment Patient Details Name: Carolyn Russo MRN: [id number] DOB: 1951/07/27 Today's Date: 06/09/2022   History of Present Illness 71 y/o female presented to ED on 05/10/22 for R foot infection after 1st ray amputation x 1 month ago. S/p R BKA on 7/26; Hospital course slowed by encephalopathy/AMS, with waxing and waning ability to prticipate in therapies; Imaging on 8/11 revealed Punctate focus of acute ischemia in the left corona radiata.  Small acute cortical infarct in the right parietal lobe;  PMH: diabetes, hypertension, hyperlipidemia, ESRD on HD MWF, hx of CVA 03/2021    PT Comments    Patient more awake on arrival but with initiation of session, patient more fatigue and less interactive. Patient required totalA+2 for bed mobility and lateral scoot transfer to [facility]. Patient continues to demonstrate slow processing, poor motor planning at times, and delayed responses. Updated discharge recommendation back to SNF as patient unable to tolerate the intensity of AIR.     Recommendations for follow up therapy are one component of a multi-disciplinary discharge planning process, led by the attending physician.  Recommendations may be updated based on patient status, additional functional criteria and insurance authorization.  Follow Up Recommendations  Skilled nursing-short term rehab (<3 hours/day) Can patient physically be transported by private vehicle: No   Assistance Recommended at Discharge Frequent or constant Supervision/Assistance  Patient can return home with the following Two people to help with walking and/or transfers;Two people to help with bathing/dressing/bathroom;Assistance with feeding;Direct supervision/assist for medications management;Direct supervision/assist for financial management;Assist for transportation   Equipment Recommendations  Hospital bed (hoyer lift)    Recommendations for Other Services       Precautions / Restrictions  Precautions Precautions: Fall Precaution Comments: limb protector Restrictions Weight Bearing Restrictions: Yes RLE Weight Bearing: Non weight bearing     Mobility  Bed Mobility Overal bed mobility: Needs Assistance Bed Mobility: Supine to Sit, Rolling, Sit to Supine Rolling: Max assist   Supine to sit: Total assist, +2 for physical assistance, +2 for safety/equipment Sit to supine: Total assist, +2 for physical assistance, +2 for safety/equipment   General bed mobility comments: minimal initation by patient. Initially able to come into long sitting but when asked to come to EOB, patient leaned back onto bed with little movement to assist achieving EOB    Transfers Overall transfer level: Needs assistance Equipment used: None Transfers: Bed to chair/wheelchair/BSC            Lateral/Scoot Transfers: Total assist, +2 physical assistance, +2 safety/equipment General transfer comment: attempted practicing lateral scoot transfer on EOB. Cues for hand placement on bed but patient not utilizing UEs or LLE to assist with scooting. Ultimately totalA+2    Ambulation/Gait                   Stairs             Wheelchair Mobility    Modified Rankin (Stroke Patients Only)       Balance Overall balance assessment: Needs assistance Sitting-balance support: Bilateral upper extremity supported, Single extremity supported Sitting balance-Leahy Scale: Fair                                      Cognition Arousal/Alertness: Awake/alert Behavior During Therapy: Flat affect Overall Cognitive Status: Impaired/Different from baseline Area of Impairment: Memory, Following commands, Problem solving, Awareness, Attention  Current Attention Level: Selective Memory: Decreased short-term memory Following Commands: Follows one step commands consistently, Follows one step commands with increased time   Awareness: Intellectual Problem  Solving: Slow processing, Decreased initiation, Requires verbal cues, Requires tactile cues General Comments: initially awake but once session was initiated, patient became less interactive. Increased time to process commands and at times not following through. Delayed responses to questions        Exercises      General Comments        Pertinent Vitals/Pain Pain Assessment Pain Assessment: Faces Faces Pain Scale: Hurts little more Pain Location: R LE, bottom Pain Descriptors / Indicators: Grimacing, Sore Pain Intervention(s): Monitored during session    Home Living                          Prior Function            PT Goals (current goals can now be found in the care plan section) Acute Rehab PT Goals PT Goal Formulation: With patient/family Time For Goal Achievement: 06/20/22 Potential to Achieve Goals: Fair Progress towards PT goals: Not progressing toward goals - comment    Frequency    Min 2X/week      PT Plan Discharge plan needs to be updated    Co-evaluation PT/OT/SLP Co-Evaluation/Treatment: Yes Reason for Co-Treatment: Necessary to address cognition/behavior during functional activity;To address functional/ADL transfers PT goals addressed during session: Mobility/safety with mobility        AM-PAC PT "6 Clicks" Mobility   Outcome Measure  Help needed turning from your back to your side while in a flat bed without using bedrails?: Total Help needed moving from lying on your back to sitting on the side of a flat bed without using bedrails?: Total Help needed moving to and from a bed to a chair (including a wheelchair)?: Total Help needed standing up from a chair using your arms (e.g., wheelchair or bedside chair)?: Total Help needed to walk in hospital room?: Total Help needed climbing 3-5 steps with a railing? : Total 6 Click Score: 6    End of Session   Activity Tolerance: Patient tolerated treatment well Patient left: in bed;with  call bell/phone within reach;with bed alarm set Nurse Communication: Mobility status PT Visit Diagnosis: Muscle weakness (generalized) (M62.81);Other abnormalities of gait and mobility (R26.89)     Time: 4098-1191 PT Time Calculation (min) (ACUTE ONLY): 23 min  Charges:  $Therapeutic Activity: 8-22 mins                     Shedrick Sarli A. Gilford Rile PT, DPT Acute Rehabilitation Services Office [phone]    Linna Hoff 06/09/2022, 12:25 PM

## 2022-06-09 NOTE — Progress Notes (Signed)
PROGRESS NOTE    Carolyn Russo  KWI:[id number] DOB: 1951-09-03 DOA: 05/10/2022 PCP: [name], MD   Brief Narrative:  This 71 y.o. female with a history of ESRD on HD, CVA with left hemiplegia, seizures, diabetes, hypertension, hyperlipidemia, chronic systolic heart failure, PAD, GI bleed. Patient presented secondary to worsening right foot infection, found to have gangrene. Empiric antibiotics initiated and patient underwent  right BKA. During hospitalization, course has been complicated by altered mental status, which is currently improving.  At this time, plan is for discharge to SNF, though Dr. [name] (husband) remains interested in inpatient rehab if this were to become an option.     Assessment & Plan:   Principal Problem:   Leukocytosis Active Problems:   Gangrene of right foot (HCC)   PAD (peripheral artery disease) (HCC)   Encephalopathy   Shortness of breath   Acute stroke due to ischemia [facility])   Chest pain   History of CVA (cerebrovascular accident)   Anemia in chronic kidney disease   ESRD on dialysis ([place])   Type 2 diabetes mellitus (HCC)   Obesity (BMI 30-39.9)   Pressure ulcer   Hypertensive disorder   Dyslipidemia (high LDL; low HDL)   CAD (coronary artery disease), native coronary artery   Asthma   Diabetic retinopathy (HCC)   Basal ganglia infarction ([place])   Foot infection   Below-knee amputation of right lower extremity (HCC)   Seizures (HCC)   Leukocytosis: Patient remains afebrile, initiated on Vanco and Zosyn empirically. Monitoring off antibiotic at this time. Unclear cause at this time -> but gradually improving Chest x-ray without acute findings. Blood cx from 8/5 with bacillus species (suspected contaminant) Repeat blood cultures on 8/10 NGTD   Gangerous right foot: Initiated on empiric vancomycin and cefepime on admission. Vascular and orthopedic surgery consulted.  Patient underwent right BKA on 7/26 with subsequent discontinuation  of antibiotics.  PT/OT consulted with recommendations for SNF, however family is hoping for possible discharge to CIR.  Patient reports pain is manageable.. Wound vacuum removed on 8/6 for evaluation for infection (per Dr. Sharol Given on 8/7, no signs of infection).   Peripheral arterial disease: Continue aspirin and Brilinta.   Metabolic Encephalopathy: Multifactorial > Improved. Initial workup No etiology identified. Possibly related to medication effect.  Patient was also found to be lethargic with a blood glucose of 46 on 08/08. Possible she could have had a seizure from hypoglycemia with resultant post-ictal phase.  Cefepime recently started which could also have contributed, but episode seems to have coincided with hypoglycemic episode. -ABG without hypercarbia. -Ammonia 11 on 04/2022 -vitamin b12 wnl, folate wnl -EEG without seizures or epileptiform discharges -CT head without acute process -cefepime d/c'd -high dose thiamine -MRI with small acute infarcts in the L corona radiata and R parietal cortex.  Neurology  thinks these don't explain encephalopathy, recommending LTM EEG for recurrent lethargy  Her mental status has been consistently improved over the past several days   Acute stroke due to ischemia [facility]) MRI with small acute infarcts in L corona radiata and R parietal cortex Neurology c/s - likely due to small vessel disease Echo with EF 60-65%, no RWMA, grade 1 diastolic dysfunction , Carotid US with 1-39% stenosis bilaterally, right subclavian flow disturbed, MRA brain without contrast severly motion degraded, grossly negative intracranial MRA for LVO. A1c 4.6, lipid panel PT/OT/SLP Neurology recommending DAPT with aspirin and brilinta x 30 days (discussed with Dr. [name], he's ok with continuing brilinta after 30 days). Also recommending  outpatient 30 day event monitor.   Shortness of breath Per discussion with husband, this is chronic complaint, Chest x-ray without  abnormalities.   History of CVA (cerebrovascular accident) Resultant L hemiplegia, more notable weakness  per discussion with husband? ? Related to episode of hypoglycemia/AMS -> MRI as above.   Chest pain Reproducible , obtain EKG and troponin.   ESRD on dialysis [facility]) Renal c/s, appreciate assistance.   Anemia in chronic kidney disease H&H remains stable.   Type 2 diabetes mellitus (HCC) Hold insulin, due to episodes of hypoglycemia. a1c 4.6 Discontinue antidiabetic medication due to hemoglobin A1c 4.1.   Obesity (BMI 30-39.9) Diet and exercise discussed in detail.   Pressure ulcer: Low air loss bed Wound c/s   DVT prophylaxis: Heparin Code Status: Full code Family Communication: No family at bed side Disposition Plan:   Status is: Inpatient Remains inpatient appropriate because: Admitted for gangrenous foot underwent right BKA, tolerated well,  getting hemodialysis as per ESRD schedule.  Anticipated discharge to SNF tomorrow.   Consultants:  Podiatry Nephrology  Procedures: Right BKA  antimicrobials:  Anti-infectives (From admission, onward)    Start     Dose/Rate Route Frequency Ordered Stop   05/31/22 1430  piperacillin-tazobactam (ZOSYN) IVPB 2.25 g        2.25 g 100 mL/hr over 30 Minutes Intravenous Every 8 hours 05/31/22 1354 06/05/22 2157   05/30/22 1200  vancomycin (VANCOREADY) IVPB 750 mg/150 mL  Status:  Discontinued        750 mg 150 mL/hr over 60 Minutes Intravenous Every M-W-F (Hemodialysis) 05/28/22 1754 06/04/22 1702   05/28/22 1845  vancomycin (VANCOREADY) IVPB 1250 mg/250 mL        1,250 mg 166.7 mL/hr over 90 Minutes Intravenous  Once 05/28/22 1754 05/28/22 2108   05/28/22 1845  ceFEPIme (MAXIPIME) 1 g in sodium chloride 0.9 % 100 mL IVPB  Status:  Discontinued        1 g 200 mL/hr over 30 Minutes Intravenous Every 24 hours 05/28/22 1754 05/31/22 1241   05/18/22 1300  ceFAZolin (ANCEF) IVPB 2g/100 mL premix  Status:  Discontinued        2  g 200 mL/hr over 30 Minutes Intravenous Every 8 hours 05/18/22 1212 05/18/22 1233   05/14/22 1130  vancomycin (VANCOCIN) IVPB 1000 mg/200 mL premix        1,000 mg 200 mL/hr over 60 Minutes Intravenous  Once 05/14/22 1038 05/14/22 1429   05/11/22 1900  ceFEPIme (MAXIPIME) 1 g in sodium chloride 0.9 % 100 mL IVPB  Status:  Discontinued        1 g 200 mL/hr over 30 Minutes Intravenous Every 24 hours 05/10/22 1715 05/18/22 1633   05/11/22 1200  vancomycin (VANCOCIN) IVPB 1000 mg/200 mL premix  Status:  Discontinued        1,000 mg 200 mL/hr over 60 Minutes Intravenous Every M-W-F (Hemodialysis) 05/10/22 1715 05/19/22 0925   05/10/22 1730  vancomycin (VANCOREADY) IVPB 1750 mg/350 mL        1,750 mg 175 mL/hr over 120 Minutes Intravenous  Once 05/10/22 1715 05/10/22 2151   05/10/22 1730  ceFEPIme (MAXIPIME) 2 g in sodium chloride 0.9 % 100 mL IVPB        2 g 200 mL/hr over 30 Minutes Intravenous  Once 05/10/22 1715 05/10/22 1840        Subjective: Patient was seen and examined at bedside.  Overnight events noted.   Patient seems much improved.  Following commands.  Husband at bedside reports she is much better today. She is very weak,  husband is interested in acute rehab but which was denied two times.  Objective: Vitals:   06/09/22 0402 06/09/22 0823 06/09/22 0829 06/09/22 1437  BP: (!) 142/59 (!) 145/54  (!) 120/46  Pulse: 81 71  66  Resp: '18 18  18  '$ Temp: 98.4 F (36.9 C) 97.8 F (36.6 C)  (!) 97.1 F (36.2 C)  TempSrc: Oral     SpO2: 96% 100% 99% 100%  Weight:      Height:        Intake/Output Summary (Last 24 hours) at 06/09/2022 1528 Last data filed at 06/09/2022 1100 Gross per 24 hour  Intake 480 ml  Output --  Net 480 ml   Filed Weights   06/03/22 0717 06/03/22 0729 06/03/22 1139  Weight: 80.9 kg 80.9 kg 78.9 kg    Examination:  General exam: Appears deconditioned, but comfortable,  not in any acute distress. Respiratory system: CTA bilaterally, No wheezing,  No crackles, Normal respiratory effort. Cardiovascular system: S1 & S2 heard, regular rate and rhythm, no murmur. Gastrointestinal system: Abdomen is soft, non tender, non distended, BS+ Central nervous system: Alert and oriented x 1. No focal neurological deficits. Extremities: Right BKA , dressing noted, dried blood noted. Skin: No rashes, lesions or ulcers Psychiatry: Mood & affect appropriate.     Data Reviewed: I have personally reviewed following labs and imaging studies  CBC: Recent Labs  Lab 06/03/22 0138 06/04/22 0132 06/05/22 0112 06/06/22 0328 06/07/22 0907 06/08/22 0340  WBC 16.1* 15.2* 14.0* 13.5* 12.2* 11.1*  NEUTROABS 10.5* 9.4* 6.9 6.6  --  5.4  HGB 9.2* 8.9* 8.7* 9.4* 10.4* 9.1*  HCT 26.5* 25.2* 24.6* 27.8* 29.5* 26.7*  MCV 88.9 90.0 89.1 91.1 91.0 93.7  PLT 329 341 341 377 390 676   Basic Metabolic Panel: Recent Labs  Lab 06/03/22 0138 06/04/22 0132 06/05/22 0112 06/06/22 0328 06/07/22 0907 06/08/22 0340 06/09/22 0156  NA 128*   < > 135 134* 138 138 135  K 3.6   < > 3.8 3.9 3.9 4.3 4.2  CL 92*   < > 95* 93* 100 100 97*  CO2 23   < > '25 25 27 27 28  '$ GLUCOSE 116*   < > 234* 174* 113* 184* 240*  BUN 41*   < > 42* 57* 30* 43* 20  CREATININE 5.79*   < > 5.80* 7.55* 5.18* 6.72* 3.99*  CALCIUM 8.4*   < > 8.8* 8.9 9.0 9.0 8.6*  MG 1.6*  --  1.5* 1.8  --  1.8  --   PHOS 4.8*  --  5.2* 5.8*  --  4.1  --    < > = values in this interval not displayed.   GFR: Estimated Creatinine Clearance: 13 mL/min (A) (by C-G formula based on SCr of 3.99 mg/dL (H)). Liver Function Tests: Recent Labs  Lab 06/03/22 0138 06/04/22 0132 06/05/22 0112 06/06/22 0328 06/08/22 0340  AST 14* 15 13* 13* 16  ALT '15 14 14 12 15  '$ ALKPHOS 52 64 59 61 64  BILITOT 0.8 0.7 0.5 0.6 0.8  PROT 5.9* 5.6* 5.6* 5.7* 5.7*  ALBUMIN 2.4* 2.2* 2.3* 2.4* 2.4*   No results for input(s): "LIPASE", "AMYLASE" in the last 168 hours. No results for input(s): "AMMONIA" in the last 168  hours. Coagulation Profile: No results for input(s): "INR", "PROTIME" in the last 168 hours. Cardiac Enzymes: No results for input(s): "CKTOTAL", "  CKMB", "CKMBINDEX", "TROPONINI" in the last 168 hours. BNP (last 3 results) No results for input(s): "PROBNP" in the last 8760 hours. HbA1C: No results for input(s): "HGBA1C" in the last 72 hours. CBG: Recent Labs  Lab 06/08/22 1227 06/08/22 1649 06/08/22 2026 06/09/22 0820 06/09/22 1141  GLUCAP 110* 197* 249* 148* 242*   Lipid Profile: No results for input(s): "CHOL", "HDL", "LDLCALC", "TRIG", "CHOLHDL", "LDLDIRECT" in the last 72 hours. Thyroid Function Tests: No results for input(s): "TSH", "T4TOTAL", "FREET4", "T3FREE", "THYROIDAB" in the last 72 hours. Anemia Panel: No results for input(s): "VITAMINB12", "FOLATE", "FERRITIN", "TIBC", "IRON", "RETICCTPCT" in the last 72 hours. Sepsis Labs: No results for input(s): "PROCALCITON", "LATICACIDVEN" in the last 168 hours.  Recent Results (from the past 240 hour(s))  Culture, blood (Routine X 2) w Reflex to ID Panel     Status: None   Collection Time: 06/02/22  7:02 AM   Specimen: BLOOD LEFT HAND  Result Value Ref Range Status   Specimen Description BLOOD LEFT HAND  Final   Special Requests   Final    BOTTLES DRAWN AEROBIC AND ANAEROBIC Blood Culture adequate volume   Culture   Final    NO GROWTH 5 DAYS Performed at [facility], [address] [place], [place] [address]    Report Status 06/07/2022 FINAL  Final  Culture, blood (Routine X 2) w Reflex to ID Panel     Status: None   Collection Time: 06/02/22  7:08 AM   Specimen: BLOOD LEFT HAND  Result Value Ref Range Status   Specimen Description BLOOD LEFT HAND  Final   Special Requests   Final    BOTTLES DRAWN AEROBIC AND ANAEROBIC Blood Culture results may not be optimal due to an inadequate volume of blood received in culture bottles   Culture   Final    NO GROWTH 5 DAYS Performed at [facility], [place]  [address] [place], [place] [address]    Report Status 06/07/2022 FINAL  Final    Radiology Studies: No results found.  Scheduled Meds:  (feeding supplement) PROSource Plus  30 mL Oral BID BM   acetaminophen  1,000 mg Oral Q8H   vitamin C  1,000 mg Oral Daily   aspirin EC  81 mg Oral Daily   capsaicin   Topical BID   carvedilol  3.125 mg Oral BID WC   Chlorhexidine Gluconate Cloth  6 each Topical Q0600   darbepoetin (ARANESP) injection - DIALYSIS  200 mcg Intravenous Q Fri-HD   docusate sodium  100 mg Oral Daily   feeding supplement (NEPRO CARB STEADY)  237 mL Oral BID BM   heparin injection (subcutaneous)  5,000 Units Subcutaneous Q8H   hydrocortisone   Rectal BID   insulin aspart  0-9 Units Subcutaneous TID WC   levETIRAcetam  500 mg Oral QHS   losartan  100 mg Oral Daily   melatonin  3 mg Oral QHS   methylphenidate  5 mg Oral BID WC   mometasone-formoterol  2 puff Inhalation BID   nutrition supplement (JUVEN)  1 packet Oral BID BM   mouth rinse  15 mL Mouth Rinse 4 times per day   pantoprazole  40 mg Oral Daily   rosuvastatin  10 mg Oral Daily   sevelamer carbonate  0.8 g Oral TID WC   [START ON 06/10/2022] thiamine  100 mg Oral Daily   ticagrelor  90 mg Oral BID   Continuous Infusions:  magnesium sulfate bolus IVPB  LOS: 30 days    Time spent: 35 mins    Shawna Clamp, MD [facility]   If 7PM-7AM, please contact night-coverage

## 2022-06-09 NOTE — Progress Notes (Signed)
[facility] Progress Note   Subjective:   Patient seen and examined in room. No complaints. Denies chest pain, SOB.   Objective Vitals:   06/08/22 2300 06/09/22 0202 06/09/22 0402 06/09/22 0823  BP: (!) 117/53 (!) 142/72 (!) 142/59 (!) 145/54  Pulse: 76 87 81 71  Resp: '17 17 18 18  '$ Temp:  98.3 F (36.8 C) 98.4 F (36.9 C) 97.8 F (36.6 C)  TempSrc:  Oral Oral   SpO2: 100% 98% 96% 100%  Weight:      Height:       Physical Exam General: Alert female in NAD, laying flat in bed Heart: RRR, no murmurs, rubs or gallops Lungs: CTA bilaterally without wheezing, rhonchi or rales Abdomen: Soft, non-distended, +BS Extremities: No significant edema b/l lower extremities Dialysis Access: [facility]  Additional Objective Labs: Basic Metabolic Panel: Recent Labs  Lab 06/05/22 0112 06/06/22 0328 06/07/22 0907 06/08/22 0340 06/09/22 0156  NA 135 134* 138 138 135  K 3.8 3.9 3.9 4.3 4.2  CL 95* 93* 100 100 97*  CO2 '25 25 27 27 28  '$ GLUCOSE 234* 174* 113* 184* 240*  BUN 42* 57* 30* 43* 20  CREATININE 5.80* 7.55* 5.18* 6.72* 3.99*  CALCIUM 8.8* 8.9 9.0 9.0 8.6*  PHOS 5.2* 5.8*  --  4.1  --    Liver Function Tests: Recent Labs  Lab 06/05/22 0112 06/06/22 0328 06/08/22 0340  AST 13* 13* 16  ALT '14 12 15  '$ ALKPHOS 59 61 64  BILITOT 0.5 0.6 0.8  PROT 5.6* 5.7* 5.7*  ALBUMIN 2.3* 2.4* 2.4*   No results for input(s): "LIPASE", "AMYLASE" in the last 168 hours. CBC: Recent Labs  Lab 06/04/22 0132 06/05/22 0112 06/06/22 0328 06/07/22 0907 06/08/22 0340  WBC 15.2* 14.0* 13.5* 12.2* 11.1*  NEUTROABS 9.4* 6.9 6.6  --  5.4  HGB 8.9* 8.7* 9.4* 10.4* 9.1*  HCT 25.2* 24.6* 27.8* 29.5* 26.7*  MCV 90.0 89.1 91.1 91.0 93.7  PLT 341 341 377 390 381   Blood Culture    Component Value Date/Time   SDES BLOOD LEFT HAND 06/02/2022 0708   SPECREQUEST  06/02/2022 0708    BOTTLES DRAWN AEROBIC AND ANAEROBIC Blood Culture results may not be optimal due to an inadequate  volume of blood received in culture bottles   CULT  06/02/2022 0708    NO GROWTH 5 DAYS Performed at [facility], [place] [address] [place], [name] [address]    REPTSTATUS 06/07/2022 FINAL 06/02/2022 0708    Cardiac Enzymes: No results for input(s): "CKTOTAL", "CKMB", "CKMBINDEX", "TROPONINI" in the last 168 hours. CBG: Recent Labs  Lab 06/08/22 0643 06/08/22 1227 06/08/22 1649 06/08/22 2026 06/09/22 0820  GLUCAP 130* 110* 197* 249* 148*   Iron Studies: No results for input(s): "IRON", "TIBC", "TRANSFERRIN", "FERRITIN" in the last 72 hours. '@lablastinr3'$ @ Studies/Results: No results found. Medications:  magnesium sulfate bolus IVPB     thiamine (VITAMIN B1) injection 250 mg (06/08/22 1609)    (feeding supplement) PROSource Plus  30 mL Oral BID BM   acetaminophen  1,000 mg Oral Q8H   vitamin C  1,000 mg Oral Daily   aspirin EC  81 mg Oral Daily   capsaicin   Topical BID   carvedilol  3.125 mg Oral BID WC   Chlorhexidine Gluconate Cloth  6 each Topical Q0600   darbepoetin (ARANESP) injection - DIALYSIS  200 mcg Intravenous Q Fri-HD   docusate sodium  100 mg Oral Daily   feeding  supplement (NEPRO CARB STEADY)  237 mL Oral BID BM   heparin injection (subcutaneous)  5,000 Units Subcutaneous Q8H   hydrocortisone   Rectal BID   insulin aspart  0-9 Units Subcutaneous TID WC   levETIRAcetam  500 mg Oral QHS   losartan  100 mg Oral Daily   melatonin  3 mg Oral QHS   methylphenidate  5 mg Oral BID WC   mometasone-formoterol  2 puff Inhalation BID   nutrition supplement (JUVEN)  1 packet Oral BID BM   mouth rinse  15 mL Mouth Rinse 4 times per day   pantoprazole  40 mg Oral Daily   rosuvastatin  10 mg Oral Daily   sevelamer carbonate  0.8 g Oral TID WC   [START ON 06/10/2022] thiamine  100 mg Oral Daily   ticagrelor  90 mg Oral BID    Dialysis Orders: MWF - [place] 4 hrs 180NRE 350/700 88.5 kg 2.0 K/2.0 Ca UFP 4 No heparin - Mircera 200 mcg  IV q 2wks  - last 04/29/22 - Venofer '100mg'$  IV X 5 doses-new order, hasn't been given yet - Hectorol 87mg IV TIW  Assessment/Plan: 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 DSharol Given Previously off antibiotics, resumed empirically with rising WBC. S/p abx. Preferred narcotic agents for pain control are hydromorphone, fentanyl, and methadone. Morphine should not be used. Avoid Baclofen 2. Acute Metabolic Encephalopathy/Intermittent Confusion: Seroquel stopped, minimizing sedatives. CT head negative. Cefepime discontinued. Waxing and waning. Managed by primary. MRI with small acute infarcts. 3. ESRD: Continue HD on usual MWF schedule 4. Anemia of ESRD: continue weekly Aranesp (2080m q Fri). Was on IV iron, stopped 8/5 because of ongoing infection. Infection now resolved, can utilize IV Fe if needed. Hgb 9.1 5. Secondary hyperparathyroidism : COrrCa high, VDRA on hold. Phos at goal. Continue Renvela as binder. 6. HTN/volume: BP controlled.  UF with HD as tolerated. 7. Nutrition, protein calorie malnutrition: continue protein supplements.  8. T2DM: Per primary.  9. Hx CVA: On dual ASA/brilinta, and Keppra. 10. Dispo: SNF placement.    ViGean QuintMD CaBrentwood Meadows LLC

## 2022-06-10 ENCOUNTER — Encounter (HOSPITAL_COMMUNITY): Payer: Self-pay | Admitting: Emergency Medicine

## 2022-06-10 ENCOUNTER — Emergency Department (HOSPITAL_COMMUNITY)
Admission: EM | Admit: 2022-06-10 | Discharge: 2022-06-11 | Disposition: A | Discharge status: Home / Self Care | Payer: Medicare PPO | Attending: Emergency Medicine | Admitting: Emergency Medicine

## 2022-06-10 ENCOUNTER — Other Ambulatory Visit: Payer: Self-pay

## 2022-06-10 DIAGNOSIS — I6622 Occlusion and stenosis of left posterior cerebral artery: Secondary | ICD-10-CM | POA: Diagnosis not present

## 2022-06-10 DIAGNOSIS — I120 Hypertensive chronic kidney disease with stage 5 chronic kidney disease or end stage renal disease: Secondary | ICD-10-CM | POA: Diagnosis not present

## 2022-06-10 DIAGNOSIS — R0789 Other chest pain: Secondary | ICD-10-CM | POA: Diagnosis not present

## 2022-06-10 DIAGNOSIS — E08649 Diabetes mellitus due to underlying condition with hypoglycemia without coma: Secondary | ICD-10-CM | POA: Diagnosis not present

## 2022-06-10 DIAGNOSIS — S88111D Complete traumatic amputation at level between knee and ankle, right lower leg, subsequent encounter: Secondary | ICD-10-CM | POA: Diagnosis not present

## 2022-06-10 DIAGNOSIS — E1122 Type 2 diabetes mellitus with diabetic chronic kidney disease: Secondary | ICD-10-CM | POA: Diagnosis present

## 2022-06-10 DIAGNOSIS — F05 Delirium due to known physiological condition: Secondary | ICD-10-CM | POA: Diagnosis not present

## 2022-06-10 DIAGNOSIS — I132 Hypertensive heart and chronic kidney disease with heart failure and with stage 5 chronic kidney disease, or end stage renal disease: Secondary | ICD-10-CM | POA: Diagnosis present

## 2022-06-10 DIAGNOSIS — Z89511 Acquired absence of right leg below knee: Secondary | ICD-10-CM | POA: Diagnosis not present

## 2022-06-10 DIAGNOSIS — G928 Other toxic encephalopathy: Secondary | ICD-10-CM | POA: Diagnosis present

## 2022-06-10 DIAGNOSIS — N2581 Secondary hyperparathyroidism of renal origin: Secondary | ICD-10-CM | POA: Diagnosis present

## 2022-06-10 DIAGNOSIS — Z794 Long term (current) use of insulin: Secondary | ICD-10-CM | POA: Insufficient documentation

## 2022-06-10 DIAGNOSIS — M6281 Muscle weakness (generalized): Secondary | ICD-10-CM | POA: Diagnosis not present

## 2022-06-10 DIAGNOSIS — R1311 Dysphagia, oral phase: Secondary | ICD-10-CM | POA: Diagnosis not present

## 2022-06-10 DIAGNOSIS — D631 Anemia in chronic kidney disease: Secondary | ICD-10-CM | POA: Diagnosis present

## 2022-06-10 DIAGNOSIS — Z91041 Radiographic dye allergy status: Secondary | ICD-10-CM | POA: Diagnosis not present

## 2022-06-10 DIAGNOSIS — Z7401 Bed confinement status: Secondary | ICD-10-CM | POA: Diagnosis not present

## 2022-06-10 DIAGNOSIS — R2689 Other abnormalities of gait and mobility: Secondary | ICD-10-CM | POA: Diagnosis not present

## 2022-06-10 DIAGNOSIS — Z7902 Long term (current) use of antithrombotics/antiplatelets: Secondary | ICD-10-CM | POA: Diagnosis not present

## 2022-06-10 DIAGNOSIS — R739 Hyperglycemia, unspecified: Secondary | ICD-10-CM | POA: Diagnosis not present

## 2022-06-10 DIAGNOSIS — Z79899 Other long term (current) drug therapy: Secondary | ICD-10-CM | POA: Diagnosis not present

## 2022-06-10 DIAGNOSIS — Z8673 Personal history of transient ischemic attack (TIA), and cerebral infarction without residual deficits: Secondary | ICD-10-CM | POA: Diagnosis not present

## 2022-06-10 DIAGNOSIS — Z91014 Allergy to mammalian meats: Secondary | ICD-10-CM | POA: Diagnosis not present

## 2022-06-10 DIAGNOSIS — R079 Chest pain, unspecified: Secondary | ICD-10-CM | POA: Diagnosis present

## 2022-06-10 DIAGNOSIS — Z7982 Long term (current) use of aspirin: Secondary | ICD-10-CM | POA: Diagnosis not present

## 2022-06-10 DIAGNOSIS — G8190 Hemiplegia, unspecified affecting unspecified side: Secondary | ICD-10-CM | POA: Diagnosis not present

## 2022-06-10 DIAGNOSIS — E1165 Type 2 diabetes mellitus with hyperglycemia: Secondary | ICD-10-CM | POA: Diagnosis not present

## 2022-06-10 DIAGNOSIS — I639 Cerebral infarction, unspecified: Secondary | ICD-10-CM | POA: Diagnosis not present

## 2022-06-10 DIAGNOSIS — R569 Unspecified convulsions: Secondary | ICD-10-CM | POA: Diagnosis not present

## 2022-06-10 DIAGNOSIS — Z91010 Allergy to peanuts: Secondary | ICD-10-CM | POA: Diagnosis not present

## 2022-06-10 DIAGNOSIS — G9340 Encephalopathy, unspecified: Secondary | ICD-10-CM | POA: Diagnosis not present

## 2022-06-10 DIAGNOSIS — S88111S Complete traumatic amputation at level between knee and ankle, right lower leg, sequela: Secondary | ICD-10-CM | POA: Diagnosis not present

## 2022-06-10 DIAGNOSIS — R29818 Other symptoms and signs involving the nervous system: Secondary | ICD-10-CM | POA: Diagnosis not present

## 2022-06-10 DIAGNOSIS — J9811 Atelectasis: Secondary | ICD-10-CM | POA: Diagnosis not present

## 2022-06-10 DIAGNOSIS — R4189 Other symptoms and signs involving cognitive functions and awareness: Secondary | ICD-10-CM | POA: Diagnosis not present

## 2022-06-10 DIAGNOSIS — Z7951 Long term (current) use of inhaled steroids: Secondary | ICD-10-CM | POA: Diagnosis not present

## 2022-06-10 DIAGNOSIS — G9389 Other specified disorders of brain: Secondary | ICD-10-CM | POA: Diagnosis not present

## 2022-06-10 DIAGNOSIS — J9600 Acute respiratory failure, unspecified whether with hypoxia or hypercapnia: Secondary | ICD-10-CM | POA: Diagnosis not present

## 2022-06-10 DIAGNOSIS — Z4781 Encounter for orthopedic aftercare following surgical amputation: Secondary | ICD-10-CM | POA: Diagnosis not present

## 2022-06-10 DIAGNOSIS — R4182 Altered mental status, unspecified: Secondary | ICD-10-CM | POA: Diagnosis present

## 2022-06-10 DIAGNOSIS — E1151 Type 2 diabetes mellitus with diabetic peripheral angiopathy without gangrene: Secondary | ICD-10-CM | POA: Diagnosis present

## 2022-06-10 DIAGNOSIS — G9341 Metabolic encephalopathy: Secondary | ICD-10-CM | POA: Diagnosis not present

## 2022-06-10 DIAGNOSIS — I161 Hypertensive emergency: Secondary | ICD-10-CM | POA: Diagnosis present

## 2022-06-10 DIAGNOSIS — E11649 Type 2 diabetes mellitus with hypoglycemia without coma: Secondary | ICD-10-CM | POA: Diagnosis present

## 2022-06-10 DIAGNOSIS — R41841 Cognitive communication deficit: Secondary | ICD-10-CM | POA: Diagnosis not present

## 2022-06-10 DIAGNOSIS — N390 Urinary tract infection, site not specified: Secondary | ICD-10-CM | POA: Diagnosis present

## 2022-06-10 DIAGNOSIS — I69354 Hemiplegia and hemiparesis following cerebral infarction affecting left non-dominant side: Secondary | ICD-10-CM | POA: Diagnosis not present

## 2022-06-10 DIAGNOSIS — R072 Precordial pain: Secondary | ICD-10-CM

## 2022-06-10 DIAGNOSIS — I96 Gangrene, not elsewhere classified: Secondary | ICD-10-CM | POA: Diagnosis present

## 2022-06-10 DIAGNOSIS — Z992 Dependence on renal dialysis: Secondary | ICD-10-CM | POA: Diagnosis not present

## 2022-06-10 DIAGNOSIS — R2681 Unsteadiness on feet: Secondary | ICD-10-CM | POA: Diagnosis not present

## 2022-06-10 DIAGNOSIS — I5032 Chronic diastolic (congestive) heart failure: Secondary | ICD-10-CM | POA: Diagnosis present

## 2022-06-10 DIAGNOSIS — I959 Hypotension, unspecified: Secondary | ICD-10-CM | POA: Diagnosis not present

## 2022-06-10 DIAGNOSIS — I6522 Occlusion and stenosis of left carotid artery: Secondary | ICD-10-CM | POA: Diagnosis not present

## 2022-06-10 DIAGNOSIS — N186 End stage renal disease: Secondary | ICD-10-CM | POA: Diagnosis present

## 2022-06-10 DIAGNOSIS — E162 Hypoglycemia, unspecified: Secondary | ICD-10-CM | POA: Diagnosis not present

## 2022-06-10 DIAGNOSIS — G319 Degenerative disease of nervous system, unspecified: Secondary | ICD-10-CM | POA: Diagnosis not present

## 2022-06-10 DIAGNOSIS — D72829 Elevated white blood cell count, unspecified: Secondary | ICD-10-CM | POA: Diagnosis not present

## 2022-06-10 DIAGNOSIS — L089 Local infection of the skin and subcutaneous tissue, unspecified: Secondary | ICD-10-CM | POA: Diagnosis not present

## 2022-06-10 DIAGNOSIS — R5383 Other fatigue: Secondary | ICD-10-CM | POA: Diagnosis not present

## 2022-06-10 DIAGNOSIS — I69391 Dysphagia following cerebral infarction: Secondary | ICD-10-CM | POA: Diagnosis not present

## 2022-06-10 DIAGNOSIS — R0602 Shortness of breath: Secondary | ICD-10-CM | POA: Insufficient documentation

## 2022-06-10 DIAGNOSIS — Z91013 Allergy to seafood: Secondary | ICD-10-CM | POA: Diagnosis not present

## 2022-06-10 DIAGNOSIS — M2550 Pain in unspecified joint: Secondary | ICD-10-CM | POA: Diagnosis not present

## 2022-06-10 DIAGNOSIS — I6521 Occlusion and stenosis of right carotid artery: Secondary | ICD-10-CM | POA: Diagnosis not present

## 2022-06-10 DIAGNOSIS — I517 Cardiomegaly: Secondary | ICD-10-CM | POA: Diagnosis not present

## 2022-06-10 DIAGNOSIS — Z4682 Encounter for fitting and adjustment of non-vascular catheter: Secondary | ICD-10-CM | POA: Diagnosis not present

## 2022-06-10 LAB — CBC
HCT: 27.9 % — ABNORMAL LOW (ref 36.0–46.0)
Hemoglobin: 9.8 g/dL — ABNORMAL LOW (ref 12.0–15.0)
MCH: 32.1 pg (ref 26.0–34.0)
MCHC: 35.1 g/dL (ref 30.0–36.0)
MCV: 91.5 fL (ref 80.0–100.0)
Platelets: 358 10*3/uL (ref 150–400)
RBC: 3.05 MIL/uL — ABNORMAL LOW (ref 3.87–5.11)
RDW: 22.2 % — ABNORMAL HIGH (ref 11.5–15.5)
WBC: 10.4 10*3/uL (ref 4.0–10.5)
nRBC: 0.9 % — ABNORMAL HIGH (ref 0.0–0.2)

## 2022-06-10 LAB — GLUCOSE, CAPILLARY
Glucose-Capillary: 124 mg/dL — ABNORMAL HIGH (ref 70–99)
Glucose-Capillary: 167 mg/dL — ABNORMAL HIGH (ref 70–99)
Glucose-Capillary: 146 mg/dL — ABNORMAL HIGH (ref 70–99)

## 2022-06-10 LAB — MAGNESIUM: Magnesium: 1.9 mg/dL (ref 1.7–2.4)

## 2022-06-10 LAB — PHOSPHORUS: Phosphorus: 4.4 mg/dL (ref 2.5–4.6)

## 2022-06-10 MED ORDER — LOSARTAN POTASSIUM 100 MG PO TABS: 100.0000 mg | ORAL_TABLET | Freq: Every day | ORAL | 2 refills | Status: DC

## 2022-06-10 MED ORDER — OXYCODONE HCL 5 MG PO TABS: 2.5000 mg | ORAL_TABLET | ORAL | 0 refills | Status: DC | PRN

## 2022-06-10 MED ORDER — ASPIRIN 81 MG PO TBEC: 81.0000 mg | DELAYED_RELEASE_TABLET | Freq: Every day | ORAL | 12 refills | Status: DC

## 2022-06-10 MED ORDER — VITAMIN B-1 100 MG PO TABS: 100.0000 mg | ORAL_TABLET | Freq: Every day | ORAL | 1 refills | Status: DC

## 2022-06-10 MED ORDER — TICAGRELOR 90 MG PO TABS: 90.0000 mg | ORAL_TABLET | Freq: Two times a day (BID) | ORAL | 3 refills | Status: DC

## 2022-06-10 MED ORDER — HEPARIN SODIUM (PORCINE) 1000 UNIT/ML IJ SOLN
INTRAMUSCULAR | Status: AC
  Filled 2022-06-10: qty 4

## 2022-06-10 MED ORDER — POTASSIUM CHLORIDE CRYS ER 20 MEQ PO TBCR: 20.0000 meq | EXTENDED_RELEASE_TABLET | Freq: Every day | ORAL | 0 refills | Status: DC | PRN

## 2022-06-10 NOTE — Plan of Care (Signed)
Problem: Education: Goal: Ability to describe self-care measures that may prevent or decrease complications (Diabetes Survival Skills Education) will improve Outcome: Adequate for Discharge Goal: Individualized Educational Video(s) Outcome: Adequate for Discharge   Problem: Coping: Goal: Ability to adjust to condition or change in health will improve Outcome: Adequate for Discharge   Problem: Fluid Volume: Goal: Ability to maintain a balanced intake and output will improve Outcome: Adequate for Discharge   Problem: Health Behavior/Discharge Planning: Goal: Ability to identify and utilize available resources and services will improve Outcome: Adequate for Discharge Goal: Ability to manage health-related needs will improve Outcome: Adequate for Discharge   Problem: Metabolic: Goal: Ability to maintain appropriate glucose levels will improve Outcome: Adequate for Discharge   Problem: Nutritional: Goal: Maintenance of adequate nutrition will improve Outcome: Adequate for Discharge Goal: Progress toward achieving an optimal weight will improve Outcome: Adequate for Discharge   Problem: Skin Integrity: Goal: Risk for impaired skin integrity will decrease Outcome: Adequate for Discharge   Problem: Tissue Perfusion: Goal: Adequacy of tissue perfusion will improve Outcome: Adequate for Discharge   Problem: Education: Goal: Knowledge of General Education information will improve Description: Including pain rating scale, medication(s)/side effects and non-pharmacologic comfort measures Outcome: Adequate for Discharge   Problem: Health Behavior/Discharge Planning: Goal: Ability to manage health-related needs will improve Outcome: Adequate for Discharge   Problem: Clinical Measurements: Goal: Ability to maintain clinical measurements within normal limits will improve Outcome: Adequate for Discharge Goal: Will remain free from infection Outcome: Adequate for Discharge Goal:  Diagnostic test results will improve Outcome: Adequate for Discharge Goal: Respiratory complications will improve Outcome: Adequate for Discharge Goal: Cardiovascular complication will be avoided Outcome: Adequate for Discharge   Problem: Activity: Goal: Risk for activity intolerance will decrease Outcome: Adequate for Discharge   Problem: Nutrition: Goal: Adequate nutrition will be maintained Outcome: Adequate for Discharge   Problem: Coping: Goal: Level of anxiety will decrease Outcome: Adequate for Discharge   Problem: Elimination: Goal: Will not experience complications related to bowel motility Outcome: Adequate for Discharge   Problem: Pain Managment: Goal: General experience of comfort will improve Outcome: Adequate for Discharge   Problem: Safety: Goal: Ability to remain free from injury will improve Outcome: Adequate for Discharge   Problem: Skin Integrity: Goal: Risk for impaired skin integrity will decrease Outcome: Adequate for Discharge   Problem: Education: Goal: Knowledge of the prescribed therapeutic regimen will improve Outcome: Adequate for Discharge Goal: Ability to verbalize activity precautions or restrictions will improve Outcome: Adequate for Discharge Goal: Understanding of discharge needs will improve Outcome: Adequate for Discharge   Problem: Activity: Goal: Ability to perform//tolerate increased activity and mobilize with assistive devices will improve Outcome: Adequate for Discharge   Problem: Clinical Measurements: Goal: Postoperative complications will be avoided or minimized Outcome: Adequate for Discharge   Problem: Self-Care: Goal: Ability to meet self-care needs will improve Outcome: Adequate for Discharge   Problem: Self-Concept: Goal: Ability to maintain and perform role responsibilities to the fullest extent possible will improve Outcome: Adequate for Discharge   Problem: Pain Management: Goal: Pain level will decrease  with appropriate interventions Outcome: Adequate for Discharge   Problem: Skin Integrity: Goal: Demonstration of wound healing without infection will improve Outcome: Adequate for Discharge   Problem: Acute Rehab OT Goals (only OT should resolve) Goal: Pt. Will Perform Eating Outcome: Adequate for Discharge Goal: Pt. Will Perform Grooming Outcome: Adequate for Discharge Goal: Pt. Will Perform Upper Body Bathing Outcome: Adequate for Discharge Goal: Pt. Will Transfer To Toilet  Outcome: Adequate for Discharge Goal: OT Additional ADL Goal #1 Outcome: Adequate for Discharge   Problem: Acute Rehab PT Goals(only PT should resolve) Goal: Pt Will Go Supine/Side To Sit Outcome: Adequate for Discharge Goal: Patient Will Perform Sitting Balance Outcome: Adequate for Discharge Goal: Patient Will Transfer Sit To/From Stand Outcome: Adequate for Discharge Goal: Pt Will Transfer Bed To Chair/Chair To Bed Outcome: Adequate for Discharge Goal: Pt/caregiver will Perform Home Exercise Program Outcome: Adequate for Discharge

## 2022-06-10 NOTE — Discharge Summary (Signed)
Physician Discharge Summary  Carolyn Russo:[id number] DOB: Mar 27, 1951 DOA: 05/10/2022  PCP: [name], MD  Admit date: 05/10/2022  Discharge date: 06/10/2022  Admitted From: Home.  Disposition: Skilled nursing facility  Recommendations for Outpatient Follow-up:  Follow up with PCP in 1-2 weeks. Please obtain BMP/CBC in one week. Advised to follow-up with Neurology in 4 weeks. Advised to follow-up with orthopedics as scheduled. Advised to take aspirin and Brilinta for acute stroke. Patient underwent right below-knee amputation for gangrenous foot.  Home Health:None Equipment/Devices:None  Discharge Condition: Stable CODE STATUS: Full code Diet recommendation: Heart Healthy   Brief [name] Course: This 71 y.o. female with a history of ESRD on HD, CVA with left hemiplegia, seizures, diabetes, hypertension, hyperlipidemia, chronic systolic heart failure, PAD, GI bleed. Patient presented secondary to worsening right foot infection, found to have gangrene. Empiric antibiotics initiated and Patient was admitted for further evaluation.  Orthopedics was consulted. Patient underwent  right BKA. During hospitalization, course has been complicated by altered mental status, which has now improved.  Patient continued to have leukocytosis.  Antibiotic discontinued after few days.  White cell count has improved.  Patient had an MRI as she has developed altered mental status.  MRI shows small acute infarcts in the left corona radiator and right parietal cortex.  Neurology was consulted recommended aspirin and Brilinta for 30 days and then continue both if vascular surgery agrees.  Nephrology consulted and patient continued on hemodialysis.  Patient seems much improved.  Patient has tolerated hemodialysis in the chair.  Outpatient hemodialysis set up completed.  Patient is being discharged to [facility] skilled nursing facility for rehab.   She was managed for below problems.  Discharge  Diagnoses:  Principal Problem:   Leukocytosis Active Problems:   Gangrene of right foot (HCC)   PAD (peripheral artery disease) (HCC)   Encephalopathy   Shortness of breath   Acute stroke due to ischemia [facility])   Chest pain   History of CVA (cerebrovascular accident)   Anemia in chronic kidney disease   ESRD on dialysis ([place])   Type 2 diabetes mellitus (HCC)   Obesity (BMI 30-39.9)   Pressure ulcer   Hypertensive disorder   Dyslipidemia (high LDL; low HDL)   CAD (coronary artery disease), native coronary artery   Asthma   Diabetic retinopathy (HCC)   Basal ganglia infarction ([place])   Foot infection   Below-knee amputation of right lower extremity (HCC)   Seizures ([place])  Leukocytosis:> Resolved. Patient remains afebrile, initiated on Vanco and Zosyn empirically. Monitoring off antibiotic at this time. Unclear cause at this time -> but gradually improving Chest x-ray without acute findings. Blood cx from 8/5 with bacillus species (suspected contaminant) Repeat blood cultures on 8/10 NGTD   Gangerous right foot: Initiated on empiric vancomycin and cefepime on admission. Vascular and orthopedic surgery consulted.  Patient underwent right BKA on 7/26 with subsequent discontinuation of antibiotics.  PT/OT consulted with recommendations for SNF, however family is hoping for possible discharge to CIR.  Patient reports pain is manageable.. Wound vacuum removed on 8/6 for evaluation for infection (per Dr. Sharol Given on 8/7, no signs of infection). Outpatient follow-up recommended.   Peripheral arterial disease: Continue aspirin and Brilinta.   Metabolic Encephalopathy: Multifactorial > Improved. Initial workup No etiology identified. Possibly related to medication effect.  Patient was also found to be lethargic with a blood glucose of 46 on 08/08. Possible she could have had a seizure from hypoglycemia with resultant post-ictal phase.  Cefepime recently  started which could also have  contributed, but episode seems to have coincided with hypoglycemic episode. -ABG without hypercarbia. -Ammonia 11 on 04/2022 -vitamin b12 wnl, folate wnl -EEG without seizures or epileptiform discharges -CT head without acute process -cefepime d/c'd -high dose thiamine -MRI with small acute infarcts in the L corona radiata and R parietal cortex.  Neurology  thinks these don't explain encephalopathy, recommending LTM EEG for recurrent lethargy  Her mental status has been consistently improved over the past several days   Acute stroke due to ischemia [facility]) MRI with small acute infarcts in L corona radiata and R parietal cortex Neurology c/s - likely due to small vessel disease Echo with EF 60-65%, no RWMA, grade 1 diastolic dysfunction , Carotid US with 1-39% stenosis bilaterally, right subclavian flow disturbed, MRA brain without contrast severly motion degraded, grossly negative intracranial MRA for LVO. A1c 4.6, lipid panel PT/OT/SLP Neurology recommending DAPT with aspirin and brilinta x 30 days (discussed with Dr. [name], he's ok with continuing brilinta after 30 days). Also recommending outpatient 30 day event monitor.   Shortness of breath Per discussion with husband, this is chronic complaint, Chest x-ray without abnormalities.   History of CVA (cerebrovascular accident) Resultant L hemiplegia, more notable weakness  per discussion with husband? ? Related to episode of hypoglycemia/AMS -> MRI as above.   Chest pain Reproducible .   ESRD on dialysis [facility]) Renal c/s, appreciate assistance.   Anemia in chronic kidney disease H&H remains stable.   Type 2 diabetes mellitus (HCC) Hold insulin, due to episodes of hypoglycemia. a1c 4.6 Discontinue antidiabetic medication due to hemoglobin A1c 4.1.   Obesity (BMI 30-39.9) Diet and exercise discussed in detail.   Pressure ulcer: Low air loss bed Wound c/s  Discharge Instructions  Discharge Instructions     Call MD for:   difficulty breathing, headache or visual disturbances   Complete by: As directed    Call MD for:  persistant nausea and vomiting   Complete by: As directed    Call MD for:  severe uncontrolled pain   Complete by: As directed    Diet - low sodium heart healthy   Complete by: As directed    Discharge instructions   Complete by: As directed    Advised to follow-up with primary care physician in 1 week. Advised to follow-up with neurology in 4 weeks. Advised to follow-up with orthopedics as scheduled. Advised to take aspirin and Brilinta for acute stroke. Patient underwent right below-knee amputation for gangrenous foot.   Discharge wound care:   Complete by: As directed    Follow-up with orthopedics as scheduled.   Increase activity slowly   Complete by: As directed    Negative Pressure Wound Therapy - Incisional   Complete by: As directed    Negative Pressure Wound Therapy - Incisional   Complete by: As directed    Connect the wound VAC dressing to the Praveena plus portable wound VAC pump for discharge.      Allergies as of 06/10/2022       Reactions   Food Anaphylaxis   Peanuts; Almonds   Wheat Bran    Upset stomach    Statins Itching, Other (See Comments)   Generalized aches- tolerates crestor    Pork-derived Products    Does not eat pork    Shellfish Allergy Other (See Comments)   Mouth gets raw   Sitagliptin Other (See Comments)   Januiva - unknown reaction    Tetracycline Other (See Comments)  Raw mouth   Contrast Media [iodinated Contrast Media] Rash   Happened during CT scan over 30 years ago        Medication List     STOP taking these medications    clopidogrel 75 MG tablet Commonly known as: PLAVIX   hydrALAZINE 10 MG tablet Commonly known as: APRESOLINE   hydrocortisone 2.5 % rectal cream Commonly known as: Anusol-HC   mupirocin ointment 2 % Commonly known as: BACTROBAN   traMADol 50 MG tablet Commonly known as: ULTRAM       TAKE  these medications    (feeding supplement) PROSource Plus liquid Take 30 mLs by mouth 2 (two) times daily between meals.   acetaminophen 500 MG tablet Commonly known as: TYLENOL Take 1,000 mg by mouth 2 (two) times daily as needed (leg and arm pain).   albuterol 108 (90 Base) MCG/ACT inhaler Commonly known as: VENTOLIN HFA Inhale 2 puffs into the lungs every 4 (four) hours as needed for wheezing or shortness of breath.   ARTIFICIAL TEAR SOLUTION OP Place 1 drop into both eyes daily as needed (dry eyes).   aspirin EC 81 MG tablet Take 1 tablet (81 mg total) by mouth daily. Swallow whole.   BD Pen Needle Nano 2nd Gen 32G X 4 MM Misc Generic drug: Insulin Pen Needle See admin instructions.   carvedilol 3.125 MG tablet Commonly known as: COREG Take 1 tablet (3.125 mg total) by mouth 2 (two) times daily with a meal.   cefadroxil 500 MG capsule Commonly known as: DURICEF Take 2 capsules (1,000 mg total) by mouth every Monday, Wednesday, and Friday with hemodialysis.   diclofenac Sodium 1 % Gel Commonly known as: VOLTAREN Apply 2 g topically 4 (four) times daily. To left wrist What changed:  when to take this reasons to take this   diphenhydrAMINE 2 % cream Commonly known as: BENADRYL Apply 1 application. topically daily as needed for itching.   diphenhydrAMINE 25 MG tablet Commonly known as: BENADRYL Take 25 mg by mouth at bedtime.   Fiasp 100 UNIT/ML Soln Generic drug: Insulin Aspart (w/Niacinamide) Inject 6-16 Units as directed 3 (three) times daily as needed (high blood sugar). Sliding scale 100-150=6 units 151-200=10 units 201-250=12 units 251-300=14 units 301-350=16 units Greater than 350 = 16 units   fluticasone 50 MCG/ACT nasal spray Commonly known as: FLONASE Place 2 sprays into the nose daily as needed for allergies.   FreeStyle Libre 2 Reader Rover by Does not apply route.   insulin glargine 100 UNIT/ML Solostar Pen Commonly known as: LANTUS Inject  13 Units into the skin daily. What changed:  how much to take when to take this   iron sucrose in sodium chloride 0.9 % 100 mL Inject 100 mg into the vein as needed (based on labs).   Lastacaft 0.25 % Soln Generic drug: Alcaftadine Place 1 drop into both eyes daily as needed (itching).   levETIRAcetam 500 MG tablet Commonly known as: KEPPRA TAKE 1 TABLET(500 MG) BY MOUTH AT BEDTIME   Lidocaine 4 % Gel Apply 1 Application topically daily as needed (pain).   losartan 100 MG tablet Commonly known as: COZAAR Take 1 tablet (100 mg total) by mouth daily. What changed:  medication strength how much to take   melatonin 5 MG Tabs Take 5 mg by mouth at bedtime.   methylphenidate 5 MG tablet Commonly known as: RITALIN Take 1 tablet (5 mg total) by mouth 2 (two) times daily with breakfast and lunch. Due to stroke  attention What changed: when to take this   MIRCERA IJ Mircera   mometasone-formoterol 200-5 MCG/ACT Aero Commonly known as: DULERA Inhale 2 puffs into the lungs 2 (two) times daily.   ondansetron 4 MG disintegrating tablet Commonly known as: ZOFRAN-ODT Take 1 tablet (4 mg total) by mouth every 8 (eight) hours as needed for nausea or vomiting.   ondansetron 8 MG tablet Commonly known as: ZOFRAN Take 8 mg by mouth 2 (two) times daily as needed for refractory nausea / vomiting.   OneTouch Delica Lancets 39J Misc Use as directed to check blood sugars before breakfast and dinner dx: e11.65   oxyCODONE 5 MG immediate release tablet Commonly known as: Oxy IR/ROXICODONE Take 0.5 tablets (2.5 mg total) by mouth every 4 (four) hours as needed for moderate pain. What changed:  how much to take reasons to take this   pantoprazole 40 MG tablet Commonly known as: Protonix Take 1 tablet (40 mg total) by mouth daily.   potassium chloride SA 20 MEQ tablet Commonly known as: KLOR-CON M Take 1-2 tablets (20-40 mEq total) by mouth daily as needed (low potassium level).    rosuvastatin 20 MG tablet Commonly known as: CRESTOR Take 1 tablet (20 mg total) by mouth daily.   senna-docusate 8.6-50 MG tablet Commonly known as: Senokot-S Take 2 tablets by mouth 2 (two) times daily. What changed:  how much to take when to take this reasons to take this   sevelamer carbonate 0.8 g Pack packet Commonly known as: RENVELA Take 0.8 g by mouth 3 (three) times daily with meals.   thiamine 100 MG tablet Commonly known as: Vitamin B-1 Take 1 tablet (100 mg total) by mouth daily. Start taking on: June 11, 2022   ticagrelor 90 MG Tabs tablet Commonly known as: BRILINTA Take 1 tablet (90 mg total) by mouth 2 (two) times daily.               Discharge Care Instructions  (From admission, onward)           Start     Ordered   06/10/22 0000  Discharge wound care:       Comments: Follow-up with orthopedics as scheduled.   06/10/22 1045            Follow-up Information     Newt Minion, MD Follow up in 1 week(s).   Specialty: Orthopedic Surgery Contact information: [name] [address] [phone]         [name], MD Follow up in 1 week(s).   Specialty: Internal Medicine Contact information: [address] [name] [address] [phone]         Belva Crome, MD .   Specialty: Cardiology Contact information: [phone] [place] [address] [phone]         Garvin Fila, MD Follow up in 4 week(s).   Specialties: Neurology, Radiology Contact information: [address] [place] [address] [phone]                Allergies  Allergen Reactions   Food Anaphylaxis    Peanuts; Almonds   Wheat Bran     Upset stomach    Statins Itching and Other (See Comments)    Generalized aches- tolerates crestor    Pork-Derived Products     Does not eat pork    Shellfish Allergy Other (See Comments)    Mouth gets raw   Sitagliptin  Other (See Comments)    Januiva - unknown reaction    Tetracycline Other (See Comments)    Raw mouth   Contrast Media [Iodinated Contrast Media] Rash    Happened during CT scan over 30 years ago    Consultations: Orthopedics Neurology.   Procedures/Studies: ECHOCARDIOGRAM COMPLETE  Result Date: 06/04/2022    ECHOCARDIOGRAM REPORT   Patient Name:   Carolyn Russo [facility] Date of Exam: 06/04/2022 Medical Rec #:  [id number]      Height:       65.0 in Accession #:    [id number]     Weight:       173.9 lb Date of Birth:  1951-03-08      BSA:          1.864 m Patient Age:    38 years       BP:           150/62 mmHg Patient Gender: F              HR:           72 bpm. Exam Location:  Inpatient Procedure: 2D Echo Indications:     stroke  History:         Patient has prior history of Echocardiogram examinations, most                  recent 03/11/2021. CAD, end stage renal disease,                  Signs/Symptoms:Shortness of Breath; Risk Factors:Hypertension,                  Dyslipidemia and Diabetes.  Sonographer:     Johny Chess RDCS Referring Phys:  [place] Diagnosing Phys: Eleonore Chiquito MD  Sonographer Comments: Image acquisition challenging due to uncooperative patient. IMPRESSIONS  1. Left ventricular ejection fraction, by estimation, is 60 to 65%. The left ventricle has normal function. The left ventricle has no regional wall motion abnormalities. Left ventricular diastolic parameters are consistent with Grade I diastolic dysfunction (impaired relaxation).  2. Right ventricular systolic function is normal. The right ventricular size is normal. There is mildly elevated pulmonary artery systolic pressure. The estimated right ventricular systolic pressure is 54.2 mmHg.  3. The mitral valve is grossly normal. No evidence of mitral valve regurgitation. No evidence of mitral stenosis.  4. The aortic valve is tricuspid. There is mild calcification of the aortic valve. There is mild thickening  of the aortic valve. Aortic valve regurgitation is not visualized. Aortic valve sclerosis is present, with no evidence of aortic valve stenosis.  5. The inferior vena cava is dilated in size with >50% respiratory variability, suggesting right atrial pressure of 8 mmHg. Conclusion(s)/Recommendation(s): No intracardiac source of embolism detected on this transthoracic study. Consider a transesophageal echocardiogram to exclude cardiac source of embolism if clinically indicated. FINDINGS  Left Ventricle: Left ventricular ejection fraction, by estimation, is 60 to 65%. The left ventricle has normal function. The left ventricle has no regional wall motion abnormalities. The left ventricular internal cavity size was normal in size. There is  no left ventricular hypertrophy. Left ventricular diastolic parameters are consistent with Grade I diastolic dysfunction (impaired relaxation). Right Ventricle: The right ventricular size is normal. No increase in right ventricular wall thickness. Right ventricular systolic function is normal. There is mildly elevated pulmonary artery systolic pressure. The tricuspid regurgitant velocity is 2.80  m/s, and with an assumed right atrial pressure of  8 mmHg, the estimated right ventricular systolic pressure is 46.9 mmHg. Left Atrium: Left atrial size was normal in size. Right Atrium: Right atrial size was normal in size. Pericardium: Trivial pericardial effusion is present. Mitral Valve: The mitral valve is grossly normal. No evidence of mitral valve regurgitation. No evidence of mitral valve stenosis. Tricuspid Valve: The tricuspid valve is grossly normal. Tricuspid valve regurgitation is trivial. No evidence of tricuspid stenosis. Aortic Valve: The aortic valve is tricuspid. There is mild calcification of the aortic valve. There is mild thickening of the aortic valve. Aortic valve regurgitation is not visualized. Aortic valve sclerosis is present, with no evidence of aortic valve  stenosis. Pulmonic Valve: The pulmonic valve was grossly normal. Pulmonic valve regurgitation is not visualized. No evidence of pulmonic stenosis. Aorta: The aortic root and ascending aorta are structurally normal, with no evidence of dilitation. Venous: The inferior vena cava is dilated in size with greater than 50% respiratory variability, suggesting right atrial pressure of 8 mmHg. IAS/Shunts: The atrial septum is grossly normal.  LEFT VENTRICLE PLAX 2D LVIDd:         4.00 cm   Diastology LVIDs:         2.30 cm   LV e' medial:    11.30 cm/s LV PW:         0.90 cm   LV E/e' medial:  8.4 LV IVS:        1.00 cm   LV e' lateral:   9.68 cm/s LVOT diam:     1.90 cm   LV E/e' lateral: 9.8 LV SV:         65 LV SV Index:   35 LVOT Area:     2.84 cm  RIGHT VENTRICLE             IVC RV S prime:     12.80 cm/s  IVC diam: 2.10 cm LEFT ATRIUM             Index        RIGHT ATRIUM           Index LA diam:        3.70 cm 1.98 cm/m   RA Area:     15.20 cm LA Vol (A2C):   28.3 ml 15.18 ml/m  RA Volume:   39.40 ml  21.14 ml/m LA Vol (A4C):   37.3 ml 20.01 ml/m LA Biplane Vol: 34.2 ml 18.35 ml/m  AORTIC VALVE LVOT Vmax:   117.00 cm/s LVOT Vmean:  77.800 cm/s LVOT VTI:    0.229 m  AORTA Ao Root diam: 2.70 cm Ao Asc diam:  2.90 cm MITRAL VALVE                TRICUSPID VALVE MV Area (PHT): 3.60 cm     TR Peak grad:   31.4 mmHg MV Decel Time: 211 msec     TR Vmax:        280.00 cm/s MV E velocity: 94.70 cm/s MV A velocity: 122.00 cm/s  SHUNTS MV E/A ratio:  0.78         Systemic VTI:  0.23 m                             Systemic Diam: 1.90 cm Eleonore Chiquito MD Electronically signed by Eleonore Chiquito MD Signature Date/Time: 06/04/2022/4:23:53 PM    Final (Updated)    VAS US CAROTID  Result Date: 06/04/2022 Carotid Arterial  Duplex Study Patient Name:  Carolyn Russo [facility]  Date of Exam:   06/04/2022 Medical Rec #: [id number]       Accession #:    [id number] Date of Birth: 1951/08/19       Patient Gender: F Patient Age:   68 years Exam  Location:  [facility] Procedure:      VAS US CAROTID Referring Phys: A POWELL JR --------------------------------------------------------------------------------  Indications:       CVA. Risk Factors:      Hypertension, hyperlipidemia, Diabetes, coronary artery                    disease, prior CVA. Limitations        Today's exam was limited due to the patient's inability or                    unwillingness to cooperate. Comparison Study:  No previous exam noted. Performing Technologist: Bobetta Lime BS, RVT  Examination Guidelines: A complete evaluation includes B-mode imaging, spectral Doppler, color Doppler, and power Doppler as needed of all accessible portions of each vessel. Bilateral testing is considered an integral part of a complete examination. Limited examinations for reoccurring indications may be performed as noted.  Right Carotid Findings: +----------+--------+--------+--------+------------------+--------+           PSV cm/sEDV cm/sStenosisPlaque DescriptionComments +----------+--------+--------+--------+------------------+--------+ CCA Prox  115     0                                          +----------+--------+--------+--------+------------------+--------+ CCA Distal91      14                                         +----------+--------+--------+--------+------------------+--------+ ICA Prox  133     18              heterogenous               +----------+--------+--------+--------+------------------+--------+ ICA Distal130     23                                         +----------+--------+--------+--------+------------------+--------+ ECA       174     0                                          +----------+--------+--------+--------+------------------+--------+ +----------+--------+-------+---------+-------------------+           PSV cm/sEDV cmsDescribe Arm Pressure (mmHG) +----------+--------+-------+---------+-------------------+  Subclavian209            Turbulent                    +----------+--------+-------+---------+-------------------+ +---------+--------+--+--------+--+---------+ VertebralPSV cm/s85EDV cm/s13Antegrade +---------+--------+--+--------+--+---------+  Left Carotid Findings: +----------+--------+--------+--------+------------------+--------+           PSV cm/sEDV cm/sStenosisPlaque DescriptionComments +----------+--------+--------+--------+------------------+--------+ CCA Prox  144     10                                         +----------+--------+--------+--------+------------------+--------+  CCA Distal136     14                                         +----------+--------+--------+--------+------------------+--------+ ICA Prox  108     13              heterogenous               +----------+--------+--------+--------+------------------+--------+ ICA Distal157     23                                         +----------+--------+--------+--------+------------------+--------+ ECA       148     0                                          +----------+--------+--------+--------+------------------+--------+ +----------+--------+--------+----------------+-------------------+           PSV cm/sEDV cm/sDescribe        Arm Pressure (mmHG) +----------+--------+--------+----------------+-------------------+ [id number]             Multiphasic, WNL                    +----------+--------+--------+----------------+-------------------+ +---------+--------+---+--------+--+---------+ VertebralPSV cm/s113EDV cm/s13Antegrade +---------+--------+---+--------+--+---------+   Summary: Right Carotid: Velocities in the right ICA are consistent with a 1-39% stenosis. Left Carotid: Velocities in the left ICA are consistent with a 1-39% stenosis. Vertebrals:  Bilateral vertebral arteries demonstrate antegrade flow. Subclavians: Right subclavian artery flow was disturbed.  Normal flow              hemodynamics were seen in the left subclavian artery. *See table(s) above for measurements and observations.  Electronically signed by Antony Contras MD on 06/04/2022 at 11:53:07 AM.    Final    MR ANGIO HEAD WO CONTRAST  Result Date: 06/04/2022 CLINICAL DATA:  Follow-up examination for stroke. EXAM: MRA HEAD WITHOUT CONTRAST TECHNIQUE: Angiographic images of the Circle of Willis were acquired using MRA technique without intravenous contrast. COMPARISON:  Comparison made with previous brain MRI from earlier the same day as well as prior CTA from 05/05/2021. FINDINGS: Anterior circulation: Examination severely degraded by motion artifact, limiting assessment. Visualized distal cervical segments of the internal carotid arteries are patent with antegrade flow. Both internal carotid arteries remain patent through the siphons without obvious stenosis. A1 segments patent. Right A1 hypoplastic. Grossly normal anterior communicating artery complex. Both ACAs grossly patent to their distal aspects on time-of-flight sequence without appreciable stenosis. M1 segments patent without obvious stenosis or occlusion. No visible or obvious proximal MCA branch occlusion. Distal MCA branches grossly perfused and symmetric on time-of-flight sequence. Posterior circulation: Vertebral arteries are code dominant and patent without visible stenosis. Both PICA appear to be grossly patent at their origins. Basilar patent to its distal aspect without appreciable stenosis. Superior cerebellar arteries grossly patent at their origins. Both PCA supplied via the basilar as well as prominent bilateral posterior communicating arteries. Both PCAs appear grossly perfused and patent to their distal aspects on time-of-flight sequence. No visible stenosis. Anatomic variants: As above. Other: Evaluation for aneurysm extremely limited on this exam. IMPRESSION: 1. Severely motion degraded exam. 2. Grossly negative intracranial MRA  for large vessel occlusion. No obvious hemodynamically significant or correctable stenosis.  Electronically Signed   By: Jeannine Boga M.D.   On: 06/04/2022 05:11   DG CHEST PORT 1 VIEW  Result Date: 06/03/2022 CLINICAL DATA:  Shortness of breath. EXAM: PORTABLE CHEST 1 VIEW COMPARISON:  Chest x-ray 05/30/2022. FINDINGS: Mild streaky left basilar opacities. No confluent consolidation. No visible pleural effusions or pneumothorax. Similar cardiomediastinal silhouette, which is mildly enlarged. Right IJ approach central venous catheter with the tip projecting at the right atrium. IMPRESSION: Mild streaky left basilar opacities, favor atelectasis. Electronically Signed   By: Margaretha Sheffield M.D.   On: 06/03/2022 14:15   MR BRAIN WO CONTRAST  Result Date: 06/03/2022 CLINICAL DATA:  Confusion.  History of end-stage renal disease EXAM: MRI HEAD WITHOUT CONTRAST TECHNIQUE: Multiplanar, multiecho pulse sequences of the brain and surrounding structures were obtained without intravenous contrast. COMPARISON:  Head CT from 3 days ago. FINDINGS: Brain: Punctate focus of acute ischemia in the left corona radiata. Small acute cortical infarct in the right parietal lobe. Remote perforator infarct at the right corona radiata with wallerian degeneration seen descending the brainstem. Confluent ischemic gliosis in the cerebral white matter. No acute hemorrhage, hydrocephalus, or collection. Vascular: Preserved flow voids Skull and upper cervical spine: No focal marrow lesion Sinuses/Orbits: Bilateral cataract resection.  No acute finding Other: Intermittently pronounced motion artifact IMPRESSION: 1. 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 2. Advanced chronic small vessel ischemia. 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 3. Motion degraded. Electronically Signed   By: Jorje Guild M.D.   On: 06/03/2022 03:58   CT HEAD WO  CONTRAST (5MM)  Result Date: 05/31/2022 CLINICAL DATA:  Mental status changes EXAM: CT HEAD WITHOUT CONTRAST TECHNIQUE: Contiguous axial images were obtained from the base of the skull through the vertex without intravenous contrast. RADIATION DOSE REDUCTION: This exam was performed according to the departmental dose-optimization program which includes automated exposure control, adjustment of the mA and/or kV according to patient size and/or use of iterative reconstruction technique. COMPARISON:  Prior head CT 05/19/2022 FINDINGS: Brain: No evidence of acute infarction, hemorrhage, hydrocephalus, extra-axial collection or mass lesion/mass effect. Extensive periventricular white matter hypoattenuation similar compared to prior imaging consistent with advanced chronic microvascular ischemic white matter disease. Vascular: No hyperdense vessel or unexpected calcification. Skull: Normal. Negative for fracture or focal lesion. Sinuses/Orbits: No acute finding. Other: None. IMPRESSION: 1. No acute intracranial process. 2. Similar appearance of advanced chronic microvascular ischemic white matter disease. Electronically Signed   By: Jacqulynn Cadet M.D.   On: 05/31/2022 13:01   EEG adult  Result Date: 05/31/2022 [name], MD     05/31/2022 12:20 PM Patient Name: Carolyn Russo MRN: [id number] Epilepsy Attending: [name] Referring Physician/Provider: Mariel Aloe, MD Date: 05/31/2022 Duration: 25.32 mins Patient history: 71 year old female with altered mental status.  EEG to evaluate for seizure. Level of alertness: Awake, asleep AEDs during EEG study: LEV Technical aspects: This EEG study was done with scalp electrodes positioned according to the 10-20 International system of electrode placement. Electrical activity was reviewed with band pass filter of 1-'70Hz'$ , sensitivity of 7 uV/mm, display speed of 60m/sec with a '60Hz'$  notched filter applied as appropriate. EEG data were recorded continuously and  digitally stored.  Video monitoring was available and reviewed as appropriate. Description: During awake state, no clear posterior dominant rhythm was seen.  Sleep was characterized by sleep spindles (12 to 14 Hz), maximal frontocentral region.  EEG showed  continuous generalized 3 to 5 Hz theta-delta slowing.  Physiologic photic driving was not seen during photic stimulation.  Hyperventilation was not performed.   ABNORMALITY - Continuous slow, generalized IMPRESSION: This study is suggestive of moderate diffuse encephalopathy, nonspecific etiology.  No seizures or epileptiform discharges were seen throughout the recording. [name]   DG CHEST PORT 1 VIEW  Result Date: 05/30/2022 CLINICAL DATA:  Shortness of breath, history of leukocytosis and cellulitis EXAM: PORTABLE CHEST 1 VIEW COMPARISON:  May 27, 2022 FINDINGS: Unchanged positioning of right central venous catheter. Unchanged cardiomegaly. The mediastinal contours are within normal limits. Low lung volumes with bronchovascular crowding. No large pleural effusion or pneumothorax. The visualized skeletal structures are unremarkable. IMPRESSION: No acute cardiopulmonary abnormality. Electronically Signed   By: Beryle Flock M.D.   On: 05/30/2022 09:36   DG CHEST PORT 1 VIEW  Result Date: 05/27/2022 CLINICAL DATA:  Chest pain EXAM: PORTABLE CHEST 1 VIEW COMPARISON:  Previous studies including the examination of 05/16/2022 FINDINGS: Transverse diameter of the heart is increased. There are no signs of pulmonary edema. There is interval decrease in pulmonary vascular congestion. There is no focal pulmonary consolidation. Small linear densities are seen in the lower lung fields. There is no pleural effusion or pneumothorax. Tip of dialysis catheter is seen in the region of right atrium. IMPRESSION: There are no signs of pulmonary edema or focal pulmonary consolidation. Small linear densities in the lower lung fields suggest minimal scarring or  subsegmental atelectasis. Electronically Signed   By: Elmer Picker M.D.   On: 05/27/2022 14:57   CT CHEST ABDOMEN PELVIS W CONTRAST  Result Date: 05/22/2022 CLINICAL DATA:  70 year old female with sepsis.  Recent BKA. EXAM: CT CHEST, ABDOMEN, AND PELVIS WITH CONTRAST TECHNIQUE: Multidetector CT imaging of the chest, abdomen and pelvis was performed following the standard protocol during bolus administration of intravenous contrast. RADIATION DOSE REDUCTION: This exam was performed according to the departmental dose-optimization program which includes automated exposure control, adjustment of the mA and/or kV according to patient size and/or use of iterative reconstruction technique. CONTRAST:  162m OMNIPAQUE IOHEXOL 300 MG/ML  SOLN COMPARISON:  05/16/2022 chest radiograph, 04/17/2022 abdomen and pelvis CT. FINDINGS: CT CHEST FINDINGS Cardiovascular: Cardiomegaly identified. LEFT coronary artery stent versus atherosclerotic calcifications noted. Mild aortic atherosclerotic calcifications noted without aneurysm or pericardial effusion. A RIGHT central venous catheter is noted with tip within the LOWER RIGHT atrium. Mediastinum/Nodes: No enlarged mediastinal, hilar, or axillary lymph nodes. Thyroid gland, trachea, and esophagus demonstrate no significant findings. Lungs/Pleura: There is no evidence of airspace disease, consolidation, mass, suspicious nodule, pleural effusion or pneumothorax. Mild dependent and bibasilar atelectasis/scarring noted. Musculoskeletal: No acute or suspicious bony abnormalities are noted. Thoracic scoliosis again identified. CT ABDOMEN PELVIS FINDINGS Hepatobiliary: The liver and gallbladder are unremarkable. There is no evidence of intrahepatic or extrahepatic biliary dilatation. Pancreas: Unremarkable Spleen: Unremarkable Adrenals/Urinary Tract: Mildly atrophic kidneys noted. There is no evidence of renal mass or hydronephrosis. The adrenal glands and bladder are unremarkable.  Stomach/Bowel: A moderate hiatal hernia is noted. There is no evidence of bowel obstruction, definite bowel wall thickening or inflammatory changes. The appendix is not identified but there is no evidence of pericecal inflammation. Vascular/Lymphatic: Aortic atherosclerosis. No enlarged abdominal or pelvic lymph nodes. Reproductive: Calcified fibroids are again noted. No other significant abnormality identified. Other: No ascites, focal collection or pneumoperitoneum. A small umbilical hernia containing fat is again noted. Musculoskeletal: No acute or suspicious bony abnormalities are noted. Lumbar spine levoscoliosis and degenerative  changes again noted. IMPRESSION: 1. No evidence of acute abnormality within the chest, abdomen or pelvis. 2. Cardiomegaly and coronary artery disease. 3. Moderate hiatal hernia. 4. Small umbilical hernia containing fat. 5. Aortic Atherosclerosis (ICD10-I70.0). Electronically Signed   By: Margarette Canada M.D.   On: 05/22/2022 09:46   Overnight EEG with video  Result Date: 05/20/2022 [name], MD     05/21/2022  7:30 AM Patient Name: Carolyn Russo MRN: [id number] Epilepsy Attending: [name] Referring Physician/Provider: [name], MD Duration: 05/19/2022 2050 to 05/20/2022 8469  Patient history: 71yo F with h/o seizures now with ams. EEG to evaluate for seizure.  Level of alertness:  lethargic  AEDs during EEG study: LEV  Technical aspects: This EEG study was done with scalp electrodes positioned according to the 10-20 International system of electrode placement. Electrical activity was reviewed with band pass filter of 1-'70Hz'$ , sensitivity of 7 uV/mm, display speed of 54m/sec with a '60Hz'$  notched filter applied as appropriate. EEG data were recorded continuously and digitally stored.  Video monitoring was available and reviewed as appropriate.  Description: EEG showed continuous generalized 3 to 6 Hz theta-delta slowing. Generalized periodic discharges with  triphasic morphology at  1.5-2.'5Hz'$  were noted intermittently, more prominent when awake/stimulated. Hyperventilation and photic stimulation were not performed.    ABNORMALITY - Periodic discharges with triphasic morphology, generalized ( GPDs) - Continuous slow, generalized  IMPRESSION: This study showed generalized periodic discharges with triphasic morphology which can be on the ictal-interictal continuum.  However, the morphology, frequency and reactivity to stimulation is more likely indicative of toxic-metabolic causes. Additionally there is moderate to severe diffuse encephalopathy, nonspecific etiology. No seizures were seen throughout the recording.  PLora Havens  EEG adult  Result Date: 05/19/2022 YLora Havens MD     05/20/2022  8:19 AM Patient Name: Carolyn CROUNSEMRN: [id number] Attending: PLora HavensReferring Physician/Provider: BHetty Blend N Date: 05/19/2022 Duration: 23.04 mins Patient history: 89yoF with h/o seizures now with ams. EEG to evaluate for seizure. Level of alertness:  lethargic AEDs during EEG study: LEV Technical aspects: This EEG study was done with scalp electrodes positioned according to the 10-20 International system of electrode placement. Electrical activity was reviewed with band pass filter of 1-'70Hz'$ , sensitivity of 7 uV/mm, display speed of 382msec with a '60Hz'$  notched filter applied as appropriate. EEG data were recorded continuously and digitally stored.  Video monitoring was available and reviewed as appropriate. Description: EEG showed continuous generalized 3 to 6 Hz theta-delta slowing. Generalized periodic discharges with triphasic morphology at  1.5-2.'5Hz'$  were noted intermittently. Hyperventilation and photic stimulation were not performed.   ABNORMALITY - Periodic discharges with triphasic morphology, generalized ( GPDs) - Continuous slow, generalized IMPRESSION: This study showed generalized periodic discharges with triphasic  morphology  which can be on the ictal-interictal continuum. Recommend long term monitoring for further evaluation. Additionally there is moderate to severe diffuse encephalopathy, nonspecific etiology. No seizures were seen throughout the recording. Carolyn Russo CT HEAD WO CONTRAST (5MM)  Result Date: 05/19/2022 CLINICAL DATA:  Mental status changes of unknown cause, history of prior CVA in a [age]ear old female EXAM: CT HEAD WITHOUT CONTRAST TECHNIQUE: Contiguous axial images were obtained from the base of the skull through the vertex without intravenous contrast. RADIATION DOSE REDUCTION: This exam was performed according to the departmental dose-optimization program which includes automated exposure control, adjustment of the mA and/or kV according to patient size and/or use  of iterative reconstruction technique. COMPARISON:  MRI of the brain from May 06, 2021, CT of the head from May 05, 2021 FINDINGS: Brain: No evidence of acute infarction, hemorrhage, hydrocephalus, extra-axial collection or mass lesion/mass effect. Atrophy and chronic microvascular ischemic change with similar appearance to prior imaging. Signs of prior infarct in the RIGHT basal ganglia and corona radiata. Vascular: No hyperdense vessel or unexpected calcification. Skull: Normal. Negative for fracture or focal lesion. Sinuses/Orbits: Visualized paranasal sinuses and orbits without acute process. Other: None IMPRESSION: 1. No acute intracranial abnormality. 2. Evidence of prior infarct, atrophy and chronic microvascular ischemic change without change. Electronically Signed   By: Zetta Bills M.D.   On: 05/19/2022 10:30   DG CHEST PORT 1 VIEW  Result Date: 05/16/2022 CLINICAL DATA:  Fever, confusion, pneumonia EXAM: PORTABLE CHEST 1 VIEW COMPARISON:  Previous studies including the examination of 03/07/2022 FINDINGS: Transverse diameter heart is increased. Tip of dialysis catheter is seen in right atrium. There are no signs of  alveolar pulmonary edema or new focal infiltrates. There is no pleural effusion or pneumothorax. IMPRESSION: There are no new infiltrates or signs of pulmonary edema. Electronically Signed   By: Elmer Picker M.D.   On: 05/16/2022 13:13   VAS [place] LOWER EXTREMITY ARTERIAL DUPLEX  Result Date: 05/12/2022 LOWER EXTREMITY ARTERIAL DUPLEX STUDY Patient Name:  Carolyn Russo [facility]  Date of Exam:   05/12/2022 Medical Rec #: [id number]       Accession #:    [id number] Date of Birth: 1951/06/17       Patient Gender: F Patient Age:   49 years Exam Location:  [facility] Procedure:      VAS [place] LOWER EXTREMITY ARTERIAL DUPLEX Referring Phys: Monica Martinez --------------------------------------------------------------------------------  Indications: RLE great toe amputation on 04/15/22 with non healing surgical site              and gangrene. High Risk Factors: Hypertension, hyperlipidemia, Diabetes, no history of                    smoking, coronary artery disease, prior CVA. Other Factors: ESRD (HD), CHF.  Vascular Interventions: S/P right PTA atherectomy and stenting on 02/21/22,. Current ABI:            HX of non-compressible ABIs Limitations: Very difficult exam due to patient moving throughout entire exam. Comparison Study: Previous exam on 03/29/22 showed 30-49% focal stenosis in mid                   PTA Performing Technologist: Jody Hill RVT, RDMS  Examination Guidelines: A complete evaluation includes B-mode imaging, spectral Doppler, color Doppler, and power Doppler as needed of all accessible portions of each vessel. Bilateral testing is considered an integral part of a complete examination. Limited examinations for reoccurring indications may be performed as noted.  +-----------+--------+-----+---------------+----------+--------------+ RIGHT      PSV cm/sRatioStenosis       Waveform  Comments       +-----------+--------+-----+---------------+----------+--------------+ CFA Mid    119                          triphasic                +-----------+--------+-----+---------------+----------+--------------+ DFA        86                          biphasic                 +-----------+--------+-----+---------------+----------+--------------+  SFA Prox   98                          triphasic                +-----------+--------+-----+---------------+----------+--------------+ SFA Mid    102                         triphasic                +-----------+--------+-----+---------------+----------+--------------+ SFA Distal 100                         triphasic                +-----------+--------+-----+---------------+----------+--------------+ POP Prox   90                          triphasic                +-----------+--------+-----+---------------+----------+--------------+ POP Distal 130                         biphasic                 +-----------+--------+-----+---------------+----------+--------------+ TP Trunk   125                         triphasic                +-----------+--------+-----+---------------+----------+--------------+ ATA Prox   79                          biphasic                 +-----------+--------+-----+---------------+----------+--------------+ ATA Mid    207     2.62 50-74% stenosisbiphasic                 +-----------+--------+-----+---------------+----------+--------------+ ATA Distal 86                          monophasic               +-----------+--------+-----+---------------+----------+--------------+ PTA Prox   112                         triphasic                +-----------+--------+-----+---------------+----------+--------------+ PTA Mid    185     1.65 30-49% stenosisbiphasic                 +-----------+--------+-----+---------------+----------+--------------+ PTA Distal 77                          monophasic                +-----------+--------+-----+---------------+----------+--------------+ PERO Prox  77                          biphasic                 +-----------+--------+-----+---------------+----------+--------------+ PERO Mid   115                         biphasic                 +-----------+--------+-----+---------------+----------+--------------+  PERO Distal                                      not visualized +-----------+--------+-----+---------------+----------+--------------+ Unable to visualized and assess stent due to vessel calcification and patient movement.  Summary: Left: 50-74% stenosis noted in the mid portion of the anterior tibial artery. 30-49% stenosis noted in the mid portion of the posterior tibial artery.  See table(s) above for measurements and observations. Electronically signed by Jamelle Haring on 05/12/2022 at 8:24:07 PM.    Final    Right below-knee amputation.   Subjective: Patient was seen and examined at bedside.  Overnight events noted.  Patient appears much improved. She is awake and alert, following commands.  Patient is being discharged to skilled nursing facility today.  Discharge Exam: Vitals:   06/10/22 0815 06/10/22 0907  BP: (!) 156/59   Pulse: 75   Resp: 18   Temp: 98 F (36.7 C)   SpO2: 100% 98%   Vitals:   06/09/22 2037 06/10/22 0431 06/10/22 0815 06/10/22 0907  BP:  136/63 (!) 156/59   Pulse: 78 74 75   Resp: '16 16 18   '$ Temp:  98.4 F (36.9 C) 98 F (36.7 C)   TempSrc:  Oral    SpO2: 98% 100% 100% 98%  Weight:      Height:        General: Pt is alert, awake, not in acute distress Cardiovascular: RRR, S1/S2 +, no rubs, no gallops Respiratory: CTA bilaterally, no wheezing, no rhonchi Abdominal: Soft, NT, ND, bowel sounds + Extremities: Right BKA, remains in dressing, no bruising noted.    The results of significant diagnostics from this hospitalization (including imaging, microbiology, ancillary and laboratory) are listed below  for reference.     Microbiology: Recent Results (from the past 240 hour(s))  Culture, blood (Routine X 2) w Reflex to ID Panel     Status: None   Collection Time: 06/02/22  7:02 AM   Specimen: BLOOD LEFT HAND  Result Value Ref Range Status   Specimen Description BLOOD LEFT HAND  Final   Special Requests   Final    BOTTLES DRAWN AEROBIC AND ANAEROBIC Blood Culture adequate volume   Culture   Final    NO GROWTH 5 DAYS Performed at [facility], [address], [place], [place] [address]    Report Status 06/07/2022 FINAL  Final  Culture, blood (Routine X 2) w Reflex to ID Panel     Status: None   Collection Time: 06/02/22  7:08 AM   Specimen: BLOOD LEFT HAND  Result Value Ref Range Status   Specimen Description BLOOD LEFT HAND  Final   Special Requests   Final    BOTTLES DRAWN AEROBIC AND ANAEROBIC Blood Culture results may not be optimal due to an inadequate volume of blood received in culture bottles   Culture   Final    NO GROWTH 5 DAYS Performed at [facility], [place] [address], [place], [place] [address]    Report Status 06/07/2022 FINAL  Final     Labs: BNP (last 3 results) No results for input(s): "BNP" in the last 8760 hours. Basic Metabolic Panel: Recent Labs  Lab 06/05/22 0112 06/06/22 0328 06/07/22 0907 06/08/22 0340 06/09/22 0156 06/10/22 0615  NA 135 134* 138 138 135  --   K 3.8 3.9 3.9 4.3 4.2  --   CL  95* 93* 100 100 97*  --   CO2 '25 25 27 27 28  '$ --   GLUCOSE 234* 174* 113* 184* 240*  --   BUN 42* 57* 30* 43* 20  --   CREATININE 5.80* 7.55* 5.18* 6.72* 3.99*  --   CALCIUM 8.8* 8.9 9.0 9.0 8.6*  --   MG 1.5* 1.8  --  1.8  --  1.9  PHOS 5.2* 5.8*  --  4.1  --  4.4   Liver Function Tests: Recent Labs  Lab 06/04/22 0132 06/05/22 0112 06/06/22 0328 06/08/22 0340  AST 15 13* 13* 16  ALT '14 14 12 15  '$ ALKPHOS 64 59 61 64  BILITOT 0.7 0.5 0.6 0.8  PROT 5.6* 5.6* 5.7* 5.7*  ALBUMIN 2.2* 2.3* 2.4* 2.4*   No results for input(s):  "LIPASE", "AMYLASE" in the last 168 hours. No results for input(s): "AMMONIA" in the last 168 hours. CBC: Recent Labs  Lab 06/04/22 0132 06/05/22 0112 06/06/22 0328 06/07/22 0907 06/08/22 0340 06/10/22 0615  WBC 15.2* 14.0* 13.5* 12.2* 11.1* 10.4  NEUTROABS 9.4* 6.9 6.6  --  5.4  --   HGB 8.9* 8.7* 9.4* 10.4* 9.1* 9.8*  HCT 25.2* 24.6* 27.8* 29.5* 26.7* 27.9*  MCV 90.0 89.1 91.1 91.0 93.7 91.5  PLT 341 341 377 390 381 358   Cardiac Enzymes: No results for input(s): "CKTOTAL", "CKMB", "CKMBINDEX", "TROPONINI" in the last 168 hours. BNP: Invalid input(s): "POCBNP" CBG: Recent Labs  Lab 06/09/22 1633 06/09/22 2015 06/09/22 2218 06/10/22 0817 06/10/22 1214  GLUCAP 190* 287* 311* 124* 167*   D-Dimer No results for input(s): "DDIMER" in the last 72 hours. Hgb A1c No results for input(s): "HGBA1C" in the last 72 hours. Lipid Profile No results for input(s): "CHOL", "HDL", "LDLCALC", "TRIG", "CHOLHDL", "LDLDIRECT" in the last 72 hours. Thyroid function studies No results for input(s): "TSH", "T4TOTAL", "T3FREE", "THYROIDAB" in the last 72 hours.  Invalid input(s): "FREET3" Anemia work up No results for input(s): "VITAMINB12", "FOLATE", "FERRITIN", "TIBC", "IRON", "RETICCTPCT" in the last 72 hours. Urinalysis    Component Value Date/Time   COLORURINE YELLOW 05/20/2022 1413   APPEARANCEUR HAZY (A) 05/20/2022 1413   LABSPEC 1.013 05/20/2022 1413   PHURINE 7.0 05/20/2022 1413   GLUCOSEU >=500 (A) 05/20/2022 1413   HGBUR NEGATIVE 05/20/2022 1413   BILIRUBINUR NEGATIVE 05/20/2022 1413   BILIRUBINUR negative 06/18/2019 1409   KETONESUR NEGATIVE 05/20/2022 1413   PROTEINUR 100 (A) 05/20/2022 1413   UROBILINOGEN 0.2 06/18/2019 1409   NITRITE NEGATIVE 05/20/2022 1413   LEUKOCYTESUR NEGATIVE 05/20/2022 1413   Sepsis Labs Recent Labs  Lab 06/06/22 0328 06/07/22 0907 06/08/22 0340 06/10/22 0615  WBC 13.5* 12.2* 11.1* 10.4   Microbiology Recent Results (from the past  240 hour(s))  Culture, blood (Routine X 2) w Reflex to ID Panel     Status: None   Collection Time: 06/02/22  7:02 AM   Specimen: BLOOD LEFT HAND  Result Value Ref Range Status   Specimen Description BLOOD LEFT HAND  Final   Special Requests   Final    BOTTLES DRAWN AEROBIC AND ANAEROBIC Blood Culture adequate volume   Culture   Final    NO GROWTH 5 DAYS Performed at [facility], [place] [address] [place], [place] [address]    Report Status 06/07/2022 FINAL  Final  Culture, blood (Routine X 2) w Reflex to ID Panel     Status: None   Collection Time: 06/02/22  7:08 AM   Specimen:  BLOOD LEFT HAND  Result Value Ref Range Status   Specimen Description BLOOD LEFT HAND  Final   Special Requests   Final    BOTTLES DRAWN AEROBIC AND ANAEROBIC Blood Culture results may not be optimal due to an inadequate volume of blood received in culture bottles   Culture   Final    NO GROWTH 5 DAYS Performed at [facility], [place] [address] [place], [name] [address]    Report Status 06/07/2022 FINAL  Final     Time coordinating discharge: Over 30 minutes  SIGNED:   Shawna Clamp, MD  [facility] 06/10/2022, 12:32 PM Pager   If 7PM-7AM, please contact night-coverage

## 2022-06-10 NOTE — Progress Notes (Addendum)
[facility] Progress Note   Subjective:   Patient seen and examined at bedside with husband present.  He reports she will be going to [facility] for rehab.  Discussed she will likely be transported via wheelchair to HD, stretcher transport is not really an option.  Plan for HD in chair today.  Tolerated HD in chair on Monday.  Breathing better this AM. Denies CP, SOB, abdominal pain and n/v/d.   Objective Vitals:   06/09/22 2037 06/10/22 0431 06/10/22 0815 06/10/22 0907  BP:  136/63 (!) 156/59   Pulse: 78 74 75   Resp: '16 16 18   '$ Temp:  98.4 F (36.9 C) 98 F (36.7 C)   TempSrc:  Oral    SpO2: 98% 100% 100% 98%  Weight:      Height:       Physical Exam General:well appearing, alert female in NAD Heart:RRR, no mrg Lungs:CTAB, nml WOB on RA Abdomen:soft, NTND Extremities:no LE edema Dialysis Access: R IJ [facility]   Filed Weights   06/03/22 0717 06/03/22 0729 06/03/22 1139  Weight: 80.9 kg 80.9 kg 78.9 kg    Intake/Output Summary (Last 24 hours) at 06/10/2022 1043 Last data filed at 06/09/2022 1300 Gross per 24 hour  Intake 240 ml  Output 600 ml  Net -360 ml    Additional Objective Labs: Basic Metabolic Panel: Recent Labs  Lab 06/06/22 0328 06/07/22 0907 06/08/22 0340 06/09/22 0156 06/10/22 0615  NA 134* 138 138 135  --   K 3.9 3.9 4.3 4.2  --   CL 93* 100 100 97*  --   CO2 '25 27 27 28  '$ --   GLUCOSE 174* 113* 184* 240*  --   BUN 57* 30* 43* 20  --   CREATININE 7.55* 5.18* 6.72* 3.99*  --   CALCIUM 8.9 9.0 9.0 8.6*  --   PHOS 5.8*  --  4.1  --  4.4   Liver Function Tests: Recent Labs  Lab 06/05/22 0112 06/06/22 0328 06/08/22 0340  AST 13* 13* 16  ALT '14 12 15  '$ ALKPHOS 59 61 64  BILITOT 0.5 0.6 0.8  PROT 5.6* 5.7* 5.7*  ALBUMIN 2.3* 2.4* 2.4*   CBC: Recent Labs  Lab 06/05/22 0112 06/06/22 0328 06/07/22 0907 06/08/22 0340 06/10/22 0615  WBC 14.0* 13.5* 12.2* 11.1* 10.4  NEUTROABS 6.9 6.6  --  5.4  --   HGB 8.7* 9.4* 10.4* 9.1* 9.8*   HCT 24.6* 27.8* 29.5* 26.7* 27.9*  MCV 89.1 91.1 91.0 93.7 91.5  PLT 341 377 390 381 358   Blood Culture    Component Value Date/Time   SDES BLOOD LEFT HAND 06/02/2022 0708   SPECREQUEST  06/02/2022 0708    BOTTLES DRAWN AEROBIC AND ANAEROBIC Blood Culture results may not be optimal due to an inadequate volume of blood received in culture bottles   CULT  06/02/2022 0708    NO GROWTH 5 DAYS Performed at [facility], [place] [address], [place], [place] [address]    REPTSTATUS 06/07/2022 FINAL 06/02/2022 0708   CBG: Recent Labs  Lab 06/09/22 1141 06/09/22 1633 06/09/22 2015 06/09/22 2218 06/10/22 0817  GLUCAP 242* 190* 287* 311* 124*   Medications:  magnesium sulfate bolus IVPB      (feeding supplement) PROSource Plus  30 mL Oral BID BM   acetaminophen  1,000 mg Oral Q8H   vitamin C  1,000 mg Oral Daily   aspirin EC  81 mg Oral Daily   capsaicin  Topical BID   carvedilol  3.125 mg Oral BID WC   Chlorhexidine Gluconate Cloth  6 each Topical Q0600   darbepoetin (ARANESP) injection - DIALYSIS  200 mcg Intravenous Q Fri-HD   docusate sodium  100 mg Oral Daily   feeding supplement (NEPRO CARB STEADY)  237 mL Oral BID BM   heparin injection (subcutaneous)  5,000 Units Subcutaneous Q8H   hydrocortisone   Rectal BID   insulin aspart  0-9 Units Subcutaneous TID WC   levETIRAcetam  500 mg Oral QHS   losartan  100 mg Oral Daily   melatonin  3 mg Oral QHS   methylphenidate  5 mg Oral BID WC   mometasone-formoterol  2 puff Inhalation BID   nutrition supplement (JUVEN)  1 packet Oral BID BM   mouth rinse  15 mL Mouth Rinse 4 times per day   pantoprazole  40 mg Oral Daily   rosuvastatin  10 mg Oral Daily   sevelamer carbonate  0.8 g Oral TID WC   thiamine  100 mg Oral Daily   ticagrelor  90 mg Oral BID    Dialysis Orders: MWF - [place] 4 hrs 180NRE 350/700 88.5 kg 2.0 K/2.0 Ca UFP 4 No heparin - Mircera 200 mcg  IV q 2wks - last 04/29/22 - Venofer '100mg'$   IV X 5 doses-new order, hasn't been given yet - Hectorol 19mg IV TIW   Assessment/Plan: 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 DSharol Given Previously off antibiotics, resumed empirically with rising WBC. S/p abx. Preferred narcotic agents for pain control are hydromorphone, fentanyl, and methadone. Morphine should not be used. Avoid Baclofen 2. Acute Metabolic Encephalopathy/Intermittent Confusion: Multifactorial. Hypoglycemia. Seroquel stopped, minimizing sedatives. CT head negative. Cefepime discontinued. Waxing and waning. Managed by primary. MRI with small acute infarcts. 3. ESRD: Continue HD on usual MWF schedule. Plan for HD in chair again today, tolerated on Monday. 4. Anemia of ESRD: continue weekly Aranesp (2072m q Fri). Was on IV iron, stopped 8/5 because of ongoing infection. Infection now resolved, can utilize IV Fe if needed. Hgb 9.8 5. Secondary hyperparathyroidism : COrrCa high, VDRA on hold. Phos at goal. Continue Renvela as binder. 6. HTN/volume: BP controlled.  UF with HD as tolerated. 7. Nutrition, protein calorie malnutrition: continue protein supplements.  8. T2DM: Per primary.  9. Hx CVA: On dual ASA/brilinta, and Keppra. 10. Dispo: SNF placement - [facility]. [place] from renal standpoint.   LiJen MowPA-C [facility] 06/10/2022,10:43 AM  LOS: 31 days

## 2022-06-10 NOTE — ED Triage Notes (Addendum)
Patient was discharged to [facility] for rehab after hospitalization.  She started having chest pain and shortness of breath en route to [place].  Patient brought back to ED for evaluation.  Patient with recent stroke during hospitalization.  Had partial amputation over a month ago of right foot.

## 2022-06-10 NOTE — Progress Notes (Signed)
Received patient in bed to unit.  Alert and oriented.  Informed consent signed and in  chart.   Treatment initiated: 1430 Treatment completed: 1845  Patient tolerated well.  Transported back to the room  alert, without acute distress.  Hand-off given to patient's nurse.   Access used: catheter Access issues: none  Total UF removed: 2000 Medication(s) given: aranesp Post HD VS: 98.4, 153/66(90), HR-72, RR-18,SP02-100 Post HD weight: unable to get. Came in dialysis chair and cant stand up   [facility]

## 2022-06-10 NOTE — Progress Notes (Signed)
Discharge instructions (including medications) discussed with and copy placed in the discharge packet.

## 2022-06-10 NOTE — Progress Notes (Signed)
Patient received from HD, transferred back to bed. Oriented to self Spouse at the bedside. CBG checked. Bed alarm activated and call bell place within reach of patient .

## 2022-06-10 NOTE — Progress Notes (Signed)
D/C order noted. Pt is to d/c to [facility] today. Pt to receive HD in chair today prior to d/c to ensure pt can tolerate. Pt's chart states pt completed HD in chair on Monday. Contacted [facility] to advise clinic of pt's d/c today and that pt will resume care on Monday.   Melven Sartorius Renal Navigator [phone]

## 2022-06-10 NOTE — TOC Transition Note (Signed)
Transition of Care [facility]) - CM/SW Discharge Note   Patient Details  Name: NYESHA CLIFF MRN: [id number] Date of Birth: 11-16-1950  Transition of Care [facility]) CM/SW Contact:  Joanne Chars, LCSW Phone Number: 06/10/2022, 1:56 PM   Clinical Narrative:   Pt discharging to [facility]. RN call [phone] for report.  Pt will be in HD this afternoon prior to DC--[facility] transportation has been arranged for 8pm.  [facility] is aware of this and is willing to accept the pt this evening. If this time needs to be adjusted, please call [facility] at [phone].    Final next level of care: Skilled Nursing Facility Barriers to Discharge: Barriers Resolved   Patient Goals and CMS Choice        Discharge Placement              Patient chooses bed at:  [facility]) Patient to be transferred to facility by: [place] Name of family member notified: husband, Dr [name] Patient and family notified of of transfer: 06/10/22  Discharge Plan and Services   Discharge Planning Services: CM Consult                      HH Arranged: RN, PT, Speech Therapy (PTA active with [facility]) [employer]: [facility] Determinants of Health (SDOH) Interventions     Readmission Risk Interventions     No data to display

## 2022-06-10 NOTE — Discharge Instructions (Signed)
Advised to follow-up with primary care physician in 1 week. Advised to follow-up with neurology in 4 weeks. Advised to follow-up with orthopedics as scheduled. Advised to take aspirin and Brilinta for acute stroke. Patient underwent right below-knee amputation for gangrenous foot.

## 2022-06-10 NOTE — Inpatient Diabetes Management (Addendum)
Inpatient Diabetes Program Recommendations  AACE/ADA: New Consensus Statement on Inpatient Glycemic Control (2015)  Target Ranges:  Prepandial:   less than 140 mg/dL      Peak postprandial:   less than 180 mg/dL (1-2 hours)      Critically ill patients:  140 - 180 mg/dL   Lab Results  Component Value Date   GLUCAP 124 (H) 06/10/2022   HGBA1C 4.6 (L) 06/04/2022    Review of Glycemic Control  Latest Reference Range & Units 06/09/22 08:20 06/09/22 11:41 06/09/22 16:33 06/09/22 20:15 06/09/22 22:18 06/10/22 08:17  Glucose-Capillary 70 - 99 mg/dL 148 (H)  Novolog 1 unit 242 (H)  Novolog 3 units 190 (H)  Novolog 2 units 287 (H) 311 (H)  Novolog 7 units 124 (H)   Diabetes history: DM 2 Outpatient Diabetes medications: Lantus 12 units Current orders for Inpatient glycemic control:  Novolog 0-9 units tid  Nepro bid between meals  A1c 4.6% on 8/12,   Hgb 8.9 A1c not accurate  Inpatient Diabetes Program Recommendations:    Glucose trends increase after meal/supplement intake  - Consider Novolog 2 units tid meal coverage if eating >50% of meals  Thanks,  Tama Headings RN, MSN, BC-ADM Inpatient Diabetes Coordinator Team Pager [phone] (8a-5p)

## 2022-06-10 NOTE — Care Management Important Message (Signed)
Important Message  Patient Details  Name: Carolyn Russo MRN: [id number] Date of Birth: 1951-07-04   Medicare Important Message Given:  Yes     [name] 06/10/2022, 11:25 AM

## 2022-06-10 NOTE — Progress Notes (Signed)
Report given to Scarlette Shorts at [facility].

## 2022-06-10 NOTE — Progress Notes (Signed)
Patient transported to [facility] by [employer].  Patient belongings giving to patient's spouse.

## 2022-06-10 NOTE — Plan of Care (Addendum)
A&Ox self. On call contacted overnight re: constant yelling heard throughout hallways that pt "cannot breathe", "needs air", "don't throw the rice and corn away" PRN's given, ineffective. RT '@the'$  bedside as well - unlabored, deep breathing, no s/s of respiratory distress. Family at the bedside for therapeutic presence for pt anxiety.     Problem: Education: Goal: Ability to describe self-care measures that may prevent or decrease complications (Diabetes Survival Skills Education) will improve Outcome: Progressing Goal: Individualized Educational Video(s) Outcome: Progressing   Problem: Coping: Goal: Ability to adjust to condition or change in health will improve Outcome: Progressing   Problem: Fluid Volume: Goal: Ability to maintain a balanced intake and output will improve Outcome: Progressing   Problem: Health Behavior/Discharge Planning: Goal: Ability to identify and utilize available resources and services will improve Outcome: Progressing Goal: Ability to manage health-related needs will improve Outcome: Progressing   Problem: Metabolic: Goal: Ability to maintain appropriate glucose levels will improve Outcome: Progressing   Problem: Nutritional: Goal: Maintenance of adequate nutrition will improve Outcome: Progressing Goal: Progress toward achieving an optimal weight will improve Outcome: Progressing   Problem: Skin Integrity: Goal: Risk for impaired skin integrity will decrease Outcome: Progressing   Problem: Tissue Perfusion: Goal: Adequacy of tissue perfusion will improve Outcome: Progressing   Problem: Education: Goal: Knowledge of General Education information will improve Description: Including pain rating scale, medication(s)/side effects and non-pharmacologic comfort measures Outcome: Progressing   Problem: Health Behavior/Discharge Planning: Goal: Ability to manage health-related needs will improve Outcome: Progressing   Problem: Clinical  Measurements: Goal: Ability to maintain clinical measurements within normal limits will improve Outcome: Progressing Goal: Will remain free from infection Outcome: Progressing Goal: Diagnostic test results will improve Outcome: Progressing Goal: Respiratory complications will improve Outcome: Progressing Goal: Cardiovascular complication will be avoided Outcome: Progressing   Problem: Activity: Goal: Risk for activity intolerance will decrease Outcome: Progressing   Problem: Nutrition: Goal: Adequate nutrition will be maintained Outcome: Progressing   Problem: Coping: Goal: Level of anxiety will decrease Outcome: Progressing   Problem: Elimination: Goal: Will not experience complications related to bowel motility Outcome: Progressing   Problem: Pain Managment: Goal: General experience of comfort will improve Outcome: Progressing   Problem: Safety: Goal: Ability to remain free from injury will improve Outcome: Progressing   Problem: Skin Integrity: Goal: Risk for impaired skin integrity will decrease Outcome: Progressing   Problem: Education: Goal: Knowledge of the prescribed therapeutic regimen will improve Outcome: Progressing Goal: Ability to verbalize activity precautions or restrictions will improve Outcome: Progressing Goal: Understanding of discharge needs will improve Outcome: Progressing   Problem: Activity: Goal: Ability to perform//tolerate increased activity and mobilize with assistive devices will improve Outcome: Progressing   Problem: Clinical Measurements: Goal: Postoperative complications will be avoided or minimized Outcome: Progressing   Problem: Self-Care: Goal: Ability to meet self-care needs will improve Outcome: Progressing   Problem: Self-Concept: Goal: Ability to maintain and perform role responsibilities to the fullest extent possible will improve Outcome: Progressing   Problem: Pain Management: Goal: Pain level will decrease  with appropriate interventions Outcome: Progressing   Problem: Skin Integrity: Goal: Demonstration of wound healing without infection will improve Outcome: Progressing

## 2022-06-11 ENCOUNTER — Emergency Department (HOSPITAL_COMMUNITY): Payer: Medicare PPO

## 2022-06-11 LAB — BASIC METABOLIC PANEL
Anion gap: 9 (ref 5–15)
BUN: 19 mg/dL (ref 8–23)
CO2: 29 mmol/L (ref 22–32)
Calcium: 8.7 mg/dL — ABNORMAL LOW (ref 8.9–10.3)
Chloride: 97 mmol/L — ABNORMAL LOW (ref 98–111)
Creatinine, Ser: 3.29 mg/dL — ABNORMAL HIGH (ref 0.44–1.00)
GFR, Estimated: 14 mL/min — ABNORMAL LOW (ref >60)
Glucose, Bld: 228 mg/dL — ABNORMAL HIGH (ref 70–99)
Potassium: 4.2 mmol/L (ref 3.5–5.1)
Sodium: 135 mmol/L (ref 135–145)

## 2022-06-11 LAB — CBC
HCT: 30.6 % — ABNORMAL LOW (ref 36.0–46.0)
Hemoglobin: 10.4 g/dL — ABNORMAL LOW (ref 12.0–15.0)
MCH: 32.6 pg (ref 26.0–34.0)
MCHC: 34 g/dL (ref 30.0–36.0)
MCV: 95.9 fL (ref 80.0–100.0)
Platelets: 328 10*3/uL (ref 150–400)
RBC: 3.19 MIL/uL — ABNORMAL LOW (ref 3.87–5.11)
RDW: 22.1 % — ABNORMAL HIGH (ref 11.5–15.5)
WBC: 9.9 10*3/uL (ref 4.0–10.5)
nRBC: 0.8 % — ABNORMAL HIGH (ref 0.0–0.2)

## 2022-06-11 LAB — TROPONIN I (HIGH SENSITIVITY)
Troponin I (High Sensitivity): 18 ng/L — ABNORMAL HIGH (ref <18)
Troponin I (High Sensitivity): 19 ng/L — ABNORMAL HIGH (ref <18)

## 2022-06-11 LAB — BRAIN NATRIURETIC PEPTIDE: B Natriuretic Peptide: 111.4 pg/mL — ABNORMAL HIGH (ref 0.0–100.0)

## 2022-06-11 NOTE — ED Notes (Signed)
[facility] called 

## 2022-06-11 NOTE — ED Provider Notes (Signed)
[facility] EMERGENCY DEPARTMENT Provider Note   CSN: [id number] Arrival date & time: 06/10/22  2348     History  Chief Complaint  Patient presents with   Chest Pain   Shortness of Breath    Carolyn Russo is a 71 y.o. female.  The history is provided by the patient and the spouse.  Chest Pain Associated symptoms: shortness of breath   Shortness of Breath Associated symptoms: chest pain   Patient presents for chest pain and shortness of breath.  Most of the history is provided by her husband.  Patient just had a prolonged hospital stay for confusion, she had a right BKA and is also on dialysis.  She was being transferred to a local nursing facility.  Prior to arrival, she had reported chest pain and shortness of breath and was brought back to the ER.  She is now chest pain-free.  Her husband reports that she will have these episodes frequently and no cause has been found. No other acute issues at this time   Patient with previous history of stroke and has left-sided hemiplegia  Home Medications Prior to Admission medications   Medication Sig Start Date End Date Taking? Authorizing Provider  acetaminophen (TYLENOL) 500 MG tablet Take 1,000 mg by mouth 2 (two) times daily as needed (leg and arm pain).    [provider]  albuterol (VENTOLIN HFA) 108 (90 Base) MCG/ACT inhaler Inhale 2 puffs into the lungs every 4 (four) hours as needed for wheezing or shortness of breath. 04/13/22   Minette Brine, FNP  Alcaftadine (LASTACAFT) 0.25 % SOLN Place 1 drop into both eyes daily as needed (itching).    [provider]  ARTIFICIAL TEAR SOLUTION OP Place 1 drop into both eyes daily as needed (dry eyes).    [provider]  aspirin EC 81 MG tablet Take 1 tablet (81 mg total) by mouth daily. Swallow whole. 06/10/22   Shawna Clamp, MD  carvedilol (COREG) 3.125 MG tablet Take 1 tablet (3.125 mg total) by mouth 2 (two) times daily with a meal. 11/01/21    [name], MD  cefadroxil (DURICEF) 500 MG capsule Take 2 capsules (1,000 mg total) by mouth every Monday, Wednesday, and Friday with hemodialysis. 05/04/22   Criselda Peaches, DPM  Continuous Blood Gluc Receiver (FREESTYLE LIBRE 2 READER) DEVI by Does not apply route.    [provider]  diclofenac Sodium (VOLTAREN) 1 % GEL Apply 2 g topically 4 (four) times daily. To left wrist Patient taking differently: Apply 2 g topically daily as needed (pain). To left wrist 05/26/21   Love, Ivan Anchors, PA-C  diphenhydrAMINE (BENADRYL) 2 % cream Apply 1 application. topically daily as needed for itching.    [provider]  diphenhydrAMINE (BENADRYL) 25 MG tablet Take 25 mg by mouth at bedtime.    [provider]  fluticasone (FLONASE) 50 MCG/ACT nasal spray Place 2 sprays into the nose daily as needed for allergies.     [provider]  Insulin Aspart, w/Niacinamide, (FIASP) 100 UNIT/ML SOLN Inject 6-16 Units as directed 3 (three) times daily as needed (high blood sugar). Sliding scale 100-150=6 units 151-200=10 units 201-250=12 units 251-300=14 units 301-350=16 units Greater than 350 = 16 units    [provider]  insulin glargine (LANTUS) 100 UNIT/ML Solostar Pen Inject 13 Units into the skin daily. Patient taking differently: Inject 12 Units into the skin at bedtime. 08/18/21   Bary Castilla, NP  Insulin Pen Needle (  BD PEN NEEDLE NANO 2ND GEN) 32G X 4 MM MISC See admin instructions. 07/15/21   [provider]  iron sucrose in sodium chloride 0.9 % 100 mL Inject 100 mg into the vein as needed (based on labs). 06/16/21   [provider]  levETIRAcetam (KEPPRA) 500 MG tablet TAKE 1 TABLET(500 MG) BY MOUTH AT BEDTIME 01/07/22   Lovorn, Jinny Blossom, MD  Lidocaine 4 % GEL Apply 1 Application topically daily as needed (pain).    [provider]  losartan (COZAAR) 100 MG tablet Take 1 tablet (100 mg total) by mouth daily. 06/10/22   Shawna Clamp, MD  melatonin 5 MG TABS Take 5 mg by mouth at bedtime.    [provider]  Methoxy PEG-Epoetin Beta (MIRCERA IJ) Mircera 07/21/21 07/20/22  [provider]  methylphenidate (RITALIN) 5 MG tablet Take 1 tablet (5 mg total) by mouth 2 (two) times daily with breakfast and lunch. Due to stroke attention Patient taking differently: Take 5 mg by mouth daily. Due to stroke attention 03/04/22   Lovorn, Jinny Blossom, MD  mometasone-formoterol (DULERA) 200-5 MCG/ACT AERO Inhale 2 puffs into the lungs 2 (two) times daily. 04/15/22   [name], MD  Nutritional Supplements (,FEEDING SUPPLEMENT, PROSOURCE PLUS) liquid Take 30 mLs by mouth 2 (two) times daily between meals. Patient not taking: Reported on 04/14/2022 05/27/21   Love, Ivan Anchors, PA-C  ondansetron (ZOFRAN) 8 MG tablet Take 8 mg by mouth 2 (two) times daily as needed for refractory nausea / vomiting.    [provider]  ondansetron (ZOFRAN-ODT) 4 MG disintegrating tablet Take 1 tablet (4 mg total) by mouth every 8 (eight) hours as needed for nausea or vomiting. 03/07/22   Ronnald Nian, Adam, DO  OneTouch Delica Lancets 83M MISC Use as directed to check blood sugars before breakfast and dinner dx: e11.65 08/18/21   Bary Castilla, NP  oxyCODONE (OXY IR/ROXICODONE) 5 MG immediate release tablet Take 0.5 tablets (2.5 mg total) by mouth every 4 (four) hours as needed for moderate pain. 06/10/22   Shawna Clamp, MD  pantoprazole (PROTONIX) 40 MG tablet Take 1 tablet (40 mg total) by mouth daily. 02/07/22   [name], MD  potassium chloride SA (KLOR-CON M) 20 MEQ tablet Take 1-2 tablets (20-40 mEq total) by mouth daily as needed (low potassium level). 06/10/22   Shawna Clamp, MD  rosuvastatin (CRESTOR) 20 MG tablet Take 1 tablet (20 mg total) by mouth daily. 08/18/21   Ghumman, Ramandeep, NP  senna-docusate (SENOKOT-S) 8.6-50 MG tablet Take 2 tablets by mouth 2 (two) times daily. Patient taking differently: Take 1-2 tablets by  mouth daily as needed for mild constipation or moderate constipation. 06/29/21   Bary Castilla, NP  sevelamer carbonate (RENVELA) 0.8 g PACK packet Take 0.8 g by mouth 3 (three) times daily with meals. 10/19/21   [provider]  thiamine (VITAMIN B-1) 100 MG tablet Take 1 tablet (100 mg total) by mouth daily. 06/11/22   Shawna Clamp, MD  ticagrelor (BRILINTA) 90 MG TABS tablet Take 1 tablet (90 mg total) by mouth 2 (two) times daily. 06/10/22   Shawna Clamp, MD  iron polysaccharides (NIFEREX) 150 MG capsule Take 1 capsule (150 mg total) by mouth daily. Patient not taking: No sig reported 03/24/21 04/16/21  Nita Sells, MD      Allergies    Food, Wheat bran, Statins, Pork-derived products, Shellfish allergy, Sitagliptin, Tetracycline, and Contrast media [iodinated contrast media]    Review of Systems   Review of Systems  Respiratory:  Positive for shortness of breath.   Cardiovascular:  Positive for chest pain.    Physical Exam Updated Vital Signs BP (!) 163/75   Pulse 84   Temp 98.5 F (36.9 C) (Axillary)   Resp 18   SpO2 97%  Physical Exam CONSTITUTIONAL: Chronically ill-appearing, no acute distress HEAD: Normocephalic/atraumatic EYES: EOMI/PERRL ENMT: Mucous membranes moist NECK: supple no meningeal signs SPINE/BACK:entire spine nontender CV: S1/S2 noted, murmur noted LUNGS: Lungs are clear to auscultation bilaterally, no apparent distress ABDOMEN: soft, nontender GU:no cva tenderness NEURO: Pt is sleeping but easily arousable and is in no distress EXTREMITIES: Left lower extremity is free of edema Right lower extremity BKA noted with brace in place SKIN: warm, color normal, dialysis access noted to right chest PSYCH: no abnormalities of mood noted, alert and oriented to situation  ED Results / Procedures / Treatments   Labs (all labs ordered are listed, but only abnormal results are displayed) Labs Reviewed  BASIC METABOLIC PANEL - Abnormal;  Notable for the following components:      Result Value   Chloride 97 (*)    Glucose, Bld 228 (*)    Creatinine, Ser 3.29 (*)    Calcium 8.7 (*)    GFR, Estimated 14 (*)    All other components within normal limits  CBC - Abnormal; Notable for the following components:   RBC 3.19 (*)    Hemoglobin 10.4 (*)    HCT 30.6 (*)    RDW 22.1 (*)    nRBC 0.8 (*)    All other components within normal limits  BRAIN NATRIURETIC PEPTIDE - Abnormal; Notable for the following components:   B Natriuretic Peptide 111.4 (*)    All other components within normal limits  TROPONIN I (HIGH SENSITIVITY) - Abnormal; Notable for the following components:   Troponin I (High Sensitivity) 18 (*)    All other components within normal limits  TROPONIN I (HIGH SENSITIVITY) - Abnormal; Notable for the following components:   Troponin I (High Sensitivity) 19 (*)    All other components within normal limits    EKG EKG Interpretation  Date/Time:  Friday June 10 2022 23:59:29 EDT Ventricular Rate:  78 PR Interval:  164 QRS Duration: 74 QT Interval:  354 QTC Calculation: 403 R Axis:   -1 Text Interpretation: Normal sinus rhythm Inferior infarct , age undetermined Anterior infarct , age undetermined Abnormal ECG No significant change since last tracing Confirmed by Ripley Fraise [phone]) on 06/11/2022 12:59:45 AM  Radiology DG Chest 2 View  Result Date: 06/11/2022 CLINICAL DATA:  Chest pain and shortness of breath. EXAM: CHEST - 2 VIEW COMPARISON:  June 03, 2022 FINDINGS: There is stable right-sided venous catheter positioning. The cardiac silhouette is mildly enlarged and unchanged in size. Mild linear atelectasis is seen within the left lung base. There is no evidence of a pleural effusion or pneumothorax. There is mild dextroscoliosis of the mid to lower thoracic spine. IMPRESSION: Mild left basilar linear atelectasis. Electronically Signed   By: Virgina Norfolk M.D.   On: 06/11/2022 01:06     Procedures Procedures    Medications Ordered in ED Medications - No data to display  ED Course/ Medical Decision Making/ A&P Clinical Course as of 06/11/22 0621  Sat Jun 11, 2022  0156 Glucose(!): 228 Hyperglycemia [DW]  0156 Creatinine(!): 3.29 Renal failure [DW]  0156 Patient just released from hospital.  She had not even went inside [facility] when she reported chest pain.  She is now chest  pain-free.  Plan will be to monitor overnight and discharge back to facility [DW]  0234 Patient resting comfortably.  Updated husband on plan.  Vitals are appropriate [DW]  0335 Troponin minimally changed.  Vitals are appropriate.  We will continue to monitor [DW]  0620 Patient is stable overnight.  She will be transferred back to [facility] which was the original plan with patient and husband she prefers to not be admitted [DW]    Clinical Course User Index [DW] Ripley Fraise, MD                           Medical Decision Making Amount and/or Complexity of Data Reviewed Labs: ordered. Decision-making details documented in ED Course. Radiology: ordered.   This patient presents to the ED for concern of chest pain, this involves an extensive number of treatment options, and is a complaint that carries with it a high risk of complications and morbidity.  The differential diagnosis includes but is not limited to acute coronary syndrome, aortic dissection, pulmonary embolism, pericarditis, pneumothorax, pneumonia, myocarditis, pleurisy, esophageal rupture    Comorbidities that complicate the patient evaluation: Patient's presentation is complicated by their history of end-stage renal disease, CVA  Social Determinants of Health: Patient's  frequent confusion, poor mobility   increases the complexity of managing their presentation  Additional history obtained: Additional history obtained from spouse spouse provides entire history Records reviewed previous admission documents  Lab  Tests: I Ordered, and personally interpreted labs.  The pertinent results include: End-stage renal disease, hyperglycemia chronic troponin elevation  Imaging Studies ordered: I ordered imaging studies including X-ray chest   I independently visualized and interpreted imaging which showed no acute findings I agree with the radiologist interpretation  Cardiac Monitoring: The patient was maintained on a cardiac monitor.  I personally viewed and interpreted the cardiac monitor which showed an underlying rhythm of:  sinus rhythm   Reevaluation: After the interventions noted above, I reevaluated the patient and found that they have :improved  Complexity of problems addressed: Patient's presentation is most consistent with  acute presentation with potential threat to life or bodily function  Disposition: After consideration of the diagnostic results and the patient's response to treatment,  I feel that the patent would benefit from discharge   .           Final Clinical Impression(s) / ED Diagnoses Final diagnoses:  ESRD (end stage renal disease) ([place])  Precordial pain  Hyperglycemia    Rx / DC Orders ED Discharge Orders     None         Ripley Fraise, MD 06/11/22 [phone]

## 2022-06-11 NOTE — Discharge Instructions (Signed)

## 2022-06-13 DIAGNOSIS — N2581 Secondary hyperparathyroidism of renal origin: Secondary | ICD-10-CM | POA: Diagnosis not present

## 2022-06-13 DIAGNOSIS — Z992 Dependence on renal dialysis: Secondary | ICD-10-CM | POA: Diagnosis not present

## 2022-06-13 DIAGNOSIS — N186 End stage renal disease: Secondary | ICD-10-CM | POA: Diagnosis not present

## 2022-06-14 DIAGNOSIS — M6281 Muscle weakness (generalized): Secondary | ICD-10-CM | POA: Diagnosis not present

## 2022-06-14 DIAGNOSIS — R2681 Unsteadiness on feet: Secondary | ICD-10-CM | POA: Diagnosis not present

## 2022-06-14 DIAGNOSIS — R1311 Dysphagia, oral phase: Secondary | ICD-10-CM | POA: Diagnosis not present

## 2022-06-14 DIAGNOSIS — R41841 Cognitive communication deficit: Secondary | ICD-10-CM | POA: Diagnosis not present

## 2022-06-15 DIAGNOSIS — N186 End stage renal disease: Secondary | ICD-10-CM | POA: Diagnosis not present

## 2022-06-15 DIAGNOSIS — Z992 Dependence on renal dialysis: Secondary | ICD-10-CM | POA: Diagnosis not present

## 2022-06-15 DIAGNOSIS — N2581 Secondary hyperparathyroidism of renal origin: Secondary | ICD-10-CM | POA: Diagnosis not present

## 2022-06-16 ENCOUNTER — Other Ambulatory Visit: Payer: Self-pay

## 2022-06-16 ENCOUNTER — Telehealth: Payer: Self-pay | Admitting: Orthopedic Surgery

## 2022-06-16 NOTE — Telephone Encounter (Signed)
I called and husband advised that the pt's dressing has not been changed since she was admitted to [facility] and they advised that it will not be changed until after appt in office on 06/23/2022. He said that the pt did not have on a wound vac. I tried to call [facility] [phone] the phone rang without answer. I will hold message and try again.

## 2022-06-16 NOTE — Telephone Encounter (Signed)
Pt's husband [name] called requesting a call back from Dr Sharol Given or Autumn F about pt's wound care. Please call [name] ay [phone].

## 2022-06-16 NOTE — Telephone Encounter (Signed)
I called and lm for nurse to call back to see what the pt has on her residual limb vac or dressing. Will hold pending return call.

## 2022-06-16 NOTE — Telephone Encounter (Signed)
Order faxed per pt request

## 2022-06-16 NOTE — Telephone Encounter (Signed)
I called and sw pt's husband to advise that I called twice the first time the call went to the nursing station with no answer and second I left a message to call back. Advised tht I will be happy to fax over order for incision check daily, 4 x 4 gauze and ace applied to right BKA with limb protector while OOB. To call with any questions.

## 2022-06-16 NOTE — Telephone Encounter (Signed)
Pt's husband called requesting orders been sent to [facility] for wound care. Please fax orders to [phone].

## 2022-06-17 DIAGNOSIS — N2581 Secondary hyperparathyroidism of renal origin: Secondary | ICD-10-CM | POA: Diagnosis not present

## 2022-06-17 DIAGNOSIS — N186 End stage renal disease: Secondary | ICD-10-CM | POA: Diagnosis not present

## 2022-06-17 DIAGNOSIS — Z992 Dependence on renal dialysis: Secondary | ICD-10-CM | POA: Diagnosis not present

## 2022-06-20 DIAGNOSIS — Z992 Dependence on renal dialysis: Secondary | ICD-10-CM | POA: Diagnosis not present

## 2022-06-20 DIAGNOSIS — N2581 Secondary hyperparathyroidism of renal origin: Secondary | ICD-10-CM | POA: Diagnosis not present

## 2022-06-20 DIAGNOSIS — N186 End stage renal disease: Secondary | ICD-10-CM | POA: Diagnosis not present

## 2022-06-21 DIAGNOSIS — R41841 Cognitive communication deficit: Secondary | ICD-10-CM | POA: Diagnosis not present

## 2022-06-21 DIAGNOSIS — M6281 Muscle weakness (generalized): Secondary | ICD-10-CM | POA: Diagnosis not present

## 2022-06-21 DIAGNOSIS — R2689 Other abnormalities of gait and mobility: Secondary | ICD-10-CM | POA: Diagnosis not present

## 2022-06-21 DIAGNOSIS — S88111D Complete traumatic amputation at level between knee and ankle, right lower leg, subsequent encounter: Secondary | ICD-10-CM | POA: Diagnosis not present

## 2022-06-22 ENCOUNTER — Emergency Department (HOSPITAL_COMMUNITY): Payer: Medicare PPO

## 2022-06-22 ENCOUNTER — Telehealth: Payer: Self-pay

## 2022-06-22 ENCOUNTER — Inpatient Hospital Stay (HOSPITAL_COMMUNITY): Payer: Medicare PPO

## 2022-06-22 ENCOUNTER — Inpatient Hospital Stay (HOSPITAL_COMMUNITY)
Admission: EM | Admit: 2022-06-22 | Discharge: 2022-06-29 | DRG: 637 | Disposition: A | Discharge status: Skilled Nursing Facility | Payer: Medicare PPO | Attending: Internal Medicine | Admitting: Internal Medicine

## 2022-06-22 DIAGNOSIS — I639 Cerebral infarction, unspecified: Secondary | ICD-10-CM | POA: Diagnosis not present

## 2022-06-22 DIAGNOSIS — Z794 Long term (current) use of insulin: Secondary | ICD-10-CM | POA: Diagnosis not present

## 2022-06-22 DIAGNOSIS — R569 Unspecified convulsions: Secondary | ICD-10-CM | POA: Diagnosis not present

## 2022-06-22 DIAGNOSIS — I120 Hypertensive chronic kidney disease with stage 5 chronic kidney disease or end stage renal disease: Secondary | ICD-10-CM | POA: Diagnosis not present

## 2022-06-22 DIAGNOSIS — Z91010 Allergy to peanuts: Secondary | ICD-10-CM

## 2022-06-22 DIAGNOSIS — Z79899 Other long term (current) drug therapy: Secondary | ICD-10-CM

## 2022-06-22 DIAGNOSIS — J9600 Acute respiratory failure, unspecified whether with hypoxia or hypercapnia: Secondary | ICD-10-CM

## 2022-06-22 DIAGNOSIS — Z91013 Allergy to seafood: Secondary | ICD-10-CM

## 2022-06-22 DIAGNOSIS — E1151 Type 2 diabetes mellitus with diabetic peripheral angiopathy without gangrene: Secondary | ICD-10-CM | POA: Diagnosis present

## 2022-06-22 DIAGNOSIS — E785 Hyperlipidemia, unspecified: Secondary | ICD-10-CM | POA: Diagnosis present

## 2022-06-22 DIAGNOSIS — I6622 Occlusion and stenosis of left posterior cerebral artery: Secondary | ICD-10-CM | POA: Diagnosis not present

## 2022-06-22 DIAGNOSIS — Z741 Need for assistance with personal care: Secondary | ICD-10-CM | POA: Diagnosis not present

## 2022-06-22 DIAGNOSIS — Z89511 Acquired absence of right leg below knee: Secondary | ICD-10-CM | POA: Diagnosis not present

## 2022-06-22 DIAGNOSIS — G9340 Encephalopathy, unspecified: Secondary | ICD-10-CM | POA: Diagnosis not present

## 2022-06-22 DIAGNOSIS — R41841 Cognitive communication deficit: Secondary | ICD-10-CM | POA: Diagnosis not present

## 2022-06-22 DIAGNOSIS — G928 Other toxic encephalopathy: Secondary | ICD-10-CM | POA: Diagnosis present

## 2022-06-22 DIAGNOSIS — E1122 Type 2 diabetes mellitus with diabetic chronic kidney disease: Secondary | ICD-10-CM | POA: Diagnosis present

## 2022-06-22 DIAGNOSIS — I96 Gangrene, not elsewhere classified: Secondary | ICD-10-CM | POA: Diagnosis present

## 2022-06-22 DIAGNOSIS — I161 Hypertensive emergency: Secondary | ICD-10-CM | POA: Diagnosis present

## 2022-06-22 DIAGNOSIS — Z4781 Encounter for orthopedic aftercare following surgical amputation: Secondary | ICD-10-CM | POA: Diagnosis not present

## 2022-06-22 DIAGNOSIS — N390 Urinary tract infection, site not specified: Secondary | ICD-10-CM | POA: Diagnosis present

## 2022-06-22 DIAGNOSIS — N186 End stage renal disease: Secondary | ICD-10-CM | POA: Diagnosis present

## 2022-06-22 DIAGNOSIS — F05 Delirium due to known physiological condition: Secondary | ICD-10-CM | POA: Diagnosis not present

## 2022-06-22 DIAGNOSIS — Z91041 Radiographic dye allergy status: Secondary | ICD-10-CM | POA: Diagnosis not present

## 2022-06-22 DIAGNOSIS — I132 Hypertensive heart and chronic kidney disease with heart failure and with stage 5 chronic kidney disease, or end stage renal disease: Secondary | ICD-10-CM | POA: Diagnosis present

## 2022-06-22 DIAGNOSIS — Z7982 Long term (current) use of aspirin: Secondary | ICD-10-CM

## 2022-06-22 DIAGNOSIS — R4189 Other symptoms and signs involving cognitive functions and awareness: Principal | ICD-10-CM

## 2022-06-22 DIAGNOSIS — D631 Anemia in chronic kidney disease: Secondary | ICD-10-CM | POA: Diagnosis present

## 2022-06-22 DIAGNOSIS — M6281 Muscle weakness (generalized): Secondary | ICD-10-CM | POA: Diagnosis not present

## 2022-06-22 DIAGNOSIS — R29818 Other symptoms and signs involving the nervous system: Secondary | ICD-10-CM | POA: Diagnosis not present

## 2022-06-22 DIAGNOSIS — R531 Weakness: Secondary | ICD-10-CM | POA: Diagnosis not present

## 2022-06-22 DIAGNOSIS — E08649 Diabetes mellitus due to underlying condition with hypoglycemia without coma: Secondary | ICD-10-CM

## 2022-06-22 DIAGNOSIS — N2581 Secondary hyperparathyroidism of renal origin: Secondary | ICD-10-CM | POA: Diagnosis present

## 2022-06-22 DIAGNOSIS — I5032 Chronic diastolic (congestive) heart failure: Secondary | ICD-10-CM | POA: Diagnosis present

## 2022-06-22 DIAGNOSIS — R2689 Other abnormalities of gait and mobility: Secondary | ICD-10-CM | POA: Diagnosis not present

## 2022-06-22 DIAGNOSIS — Z992 Dependence on renal dialysis: Secondary | ICD-10-CM | POA: Diagnosis not present

## 2022-06-22 DIAGNOSIS — Z7951 Long term (current) use of inhaled steroids: Secondary | ICD-10-CM

## 2022-06-22 DIAGNOSIS — Z7401 Bed confinement status: Secondary | ICD-10-CM | POA: Diagnosis not present

## 2022-06-22 DIAGNOSIS — I517 Cardiomegaly: Secondary | ICD-10-CM | POA: Diagnosis not present

## 2022-06-22 DIAGNOSIS — Z7902 Long term (current) use of antithrombotics/antiplatelets: Secondary | ICD-10-CM

## 2022-06-22 DIAGNOSIS — E11649 Type 2 diabetes mellitus with hypoglycemia without coma: Principal | ICD-10-CM | POA: Diagnosis present

## 2022-06-22 DIAGNOSIS — S88111D Complete traumatic amputation at level between knee and ankle, right lower leg, subsequent encounter: Secondary | ICD-10-CM | POA: Diagnosis not present

## 2022-06-22 DIAGNOSIS — M6259 Muscle wasting and atrophy, not elsewhere classified, multiple sites: Secondary | ICD-10-CM | POA: Diagnosis not present

## 2022-06-22 DIAGNOSIS — I69354 Hemiplegia and hemiparesis following cerebral infarction affecting left non-dominant side: Secondary | ICD-10-CM

## 2022-06-22 DIAGNOSIS — Z809 Family history of malignant neoplasm, unspecified: Secondary | ICD-10-CM

## 2022-06-22 DIAGNOSIS — S88111S Complete traumatic amputation at level between knee and ankle, right lower leg, sequela: Secondary | ICD-10-CM | POA: Diagnosis not present

## 2022-06-22 DIAGNOSIS — R1312 Dysphagia, oropharyngeal phase: Secondary | ICD-10-CM | POA: Diagnosis not present

## 2022-06-22 DIAGNOSIS — Z91014 Allergy to mammalian meats: Secondary | ICD-10-CM | POA: Diagnosis not present

## 2022-06-22 DIAGNOSIS — G40909 Epilepsy, unspecified, not intractable, without status epilepticus: Secondary | ICD-10-CM | POA: Diagnosis present

## 2022-06-22 DIAGNOSIS — R4182 Altered mental status, unspecified: Secondary | ICD-10-CM | POA: Diagnosis present

## 2022-06-22 DIAGNOSIS — E162 Hypoglycemia, unspecified: Secondary | ICD-10-CM | POA: Diagnosis not present

## 2022-06-22 DIAGNOSIS — G9341 Metabolic encephalopathy: Secondary | ICD-10-CM | POA: Diagnosis not present

## 2022-06-22 DIAGNOSIS — G319 Degenerative disease of nervous system, unspecified: Secondary | ICD-10-CM | POA: Diagnosis not present

## 2022-06-22 DIAGNOSIS — R404 Transient alteration of awareness: Secondary | ICD-10-CM | POA: Diagnosis not present

## 2022-06-22 DIAGNOSIS — Z4682 Encounter for fitting and adjustment of non-vascular catheter: Secondary | ICD-10-CM | POA: Diagnosis not present

## 2022-06-22 DIAGNOSIS — K219 Gastro-esophageal reflux disease without esophagitis: Secondary | ICD-10-CM | POA: Diagnosis present

## 2022-06-22 DIAGNOSIS — I6522 Occlusion and stenosis of left carotid artery: Secondary | ICD-10-CM | POA: Diagnosis not present

## 2022-06-22 DIAGNOSIS — Z833 Family history of diabetes mellitus: Secondary | ICD-10-CM

## 2022-06-22 DIAGNOSIS — G9389 Other specified disorders of brain: Secondary | ICD-10-CM | POA: Diagnosis not present

## 2022-06-22 DIAGNOSIS — I959 Hypotension, unspecified: Secondary | ICD-10-CM | POA: Diagnosis not present

## 2022-06-22 DIAGNOSIS — I6521 Occlusion and stenosis of right carotid artery: Secondary | ICD-10-CM | POA: Diagnosis not present

## 2022-06-22 LAB — URINALYSIS, ROUTINE W REFLEX MICROSCOPIC
Bilirubin Urine: NEGATIVE
Glucose, UA: 50 mg/dL — AB
Ketones, ur: NEGATIVE mg/dL
Nitrite: NEGATIVE
Protein, ur: 100 mg/dL — AB
Specific Gravity, Urine: 1.008 (ref 1.005–1.030)
WBC, UA: >50 WBC/hpf — ABNORMAL HIGH (ref 0–5)
pH: 8 (ref 5.0–8.0)

## 2022-06-22 LAB — COMPREHENSIVE METABOLIC PANEL
ALT: 21 U/L (ref 0–44)
AST: 21 U/L (ref 15–41)
Albumin: 2.8 g/dL — ABNORMAL LOW (ref 3.5–5.0)
Alkaline Phosphatase: 68 U/L (ref 38–126)
Anion gap: 13 (ref 5–15)
BUN: 30 mg/dL — ABNORMAL HIGH (ref 8–23)
CO2: 24 mmol/L (ref 22–32)
Calcium: 9.5 mg/dL (ref 8.9–10.3)
Chloride: 98 mmol/L (ref 98–111)
Creatinine, Ser: 7.35 mg/dL — ABNORMAL HIGH (ref 0.44–1.00)
GFR, Estimated: 5 mL/min — ABNORMAL LOW (ref >60)
Glucose, Bld: 139 mg/dL — ABNORMAL HIGH (ref 70–99)
Potassium: 3.8 mmol/L (ref 3.5–5.1)
Sodium: 135 mmol/L (ref 135–145)
Total Bilirubin: 0.6 mg/dL (ref 0.3–1.2)
Total Protein: 6.3 g/dL — ABNORMAL LOW (ref 6.5–8.1)

## 2022-06-22 LAB — TROPONIN I (HIGH SENSITIVITY): Troponin I (High Sensitivity): 30 ng/L — ABNORMAL HIGH (ref <18)

## 2022-06-22 LAB — I-STAT CHEM 8, ED
BUN: 44 mg/dL — ABNORMAL HIGH (ref 8–23)
Calcium, Ion: 1.1 mmol/L — ABNORMAL LOW (ref 1.15–1.40)
Chloride: 98 mmol/L (ref 98–111)
Creatinine, Ser: 7.9 mg/dL — ABNORMAL HIGH (ref 0.44–1.00)
Glucose, Bld: 117 mg/dL — ABNORMAL HIGH (ref 70–99)
HCT: 40 % (ref 36.0–46.0)
Hemoglobin: 13.6 g/dL (ref 12.0–15.0)
Potassium: 4.9 mmol/L (ref 3.5–5.1)
Sodium: 136 mmol/L (ref 135–145)
TCO2: 29 mmol/L (ref 22–32)

## 2022-06-22 LAB — CBC
HCT: 35 % — ABNORMAL LOW (ref 36.0–46.0)
Hemoglobin: 11.9 g/dL — ABNORMAL LOW (ref 12.0–15.0)
MCH: 33.1 pg (ref 26.0–34.0)
MCHC: 34 g/dL (ref 30.0–36.0)
MCV: 97.2 fL (ref 80.0–100.0)
Platelets: 334 10*3/uL (ref 150–400)
RBC: 3.6 MIL/uL — ABNORMAL LOW (ref 3.87–5.11)
RDW: 18.7 % — ABNORMAL HIGH (ref 11.5–15.5)
WBC: 13.3 10*3/uL — ABNORMAL HIGH (ref 4.0–10.5)
nRBC: 0 % (ref 0.0–0.2)

## 2022-06-22 LAB — CORTISOL: Cortisol, Plasma: 9.6 ug/dL

## 2022-06-22 LAB — I-STAT ARTERIAL BLOOD GAS, ED
Acid-Base Excess: 7 mmol/L — ABNORMAL HIGH (ref 0.0–2.0)
Bicarbonate: 30.7 mmol/L — ABNORMAL HIGH (ref 20.0–28.0)
Calcium, Ion: 1.21 mmol/L (ref 1.15–1.40)
HCT: 35 % — ABNORMAL LOW (ref 36.0–46.0)
Hemoglobin: 11.9 g/dL — ABNORMAL LOW (ref 12.0–15.0)
O2 Saturation: 100 %
Potassium: 3.5 mmol/L (ref 3.5–5.1)
Sodium: 134 mmol/L — ABNORMAL LOW (ref 135–145)
TCO2: 32 mmol/L (ref 22–32)
pCO2 arterial: 40.9 mmHg (ref 32–48)
pH, Arterial: 7.484 — ABNORMAL HIGH (ref 7.35–7.45)
pO2, Arterial: 502 mmHg — ABNORMAL HIGH (ref 83–108)

## 2022-06-22 LAB — PROTIME-INR
INR: 1 (ref 0.8–1.2)
Prothrombin Time: 12.9 seconds (ref 11.4–15.2)

## 2022-06-22 LAB — HEPATITIS B SURFACE ANTIGEN: Hepatitis B Surface Ag: NONREACTIVE

## 2022-06-22 LAB — MRSA NEXT GEN BY PCR, NASAL: MRSA by PCR Next Gen: NOT DETECTED

## 2022-06-22 LAB — LACTIC ACID, PLASMA: Lactic Acid, Venous: 2 mmol/L (ref 0.5–1.9)

## 2022-06-22 LAB — CBG MONITORING, ED
Glucose-Capillary: 115 mg/dL — ABNORMAL HIGH (ref 70–99)
Glucose-Capillary: 144 mg/dL — ABNORMAL HIGH (ref 70–99)

## 2022-06-22 LAB — HEPATITIS C ANTIBODY: HCV Ab: NONREACTIVE

## 2022-06-22 LAB — GLUCOSE, CAPILLARY
Glucose-Capillary: 138 mg/dL — ABNORMAL HIGH (ref 70–99)
Glucose-Capillary: 140 mg/dL — ABNORMAL HIGH (ref 70–99)
Glucose-Capillary: 147 mg/dL — ABNORMAL HIGH (ref 70–99)
Glucose-Capillary: 211 mg/dL — ABNORMAL HIGH (ref 70–99)

## 2022-06-22 LAB — HEPATITIS B SURFACE ANTIBODY,QUALITATIVE: Hep B S Ab: NONREACTIVE

## 2022-06-22 LAB — HEPATITIS B CORE ANTIBODY, TOTAL: Hep B Core Total Ab: NONREACTIVE

## 2022-06-22 MED ORDER — LEVETIRACETAM 100 MG/ML PO SOLN
750.0000 mg | Freq: Every day | ORAL | Status: DC
  Administered 2022-06-22: 750 mg

## 2022-06-22 MED ORDER — HEPARIN SODIUM (PORCINE) 5000 UNIT/ML IJ SOLN
5000.0000 [IU] | Freq: Three times a day (TID) | INTRAMUSCULAR | Status: DC
  Administered 2022-06-22 – 2022-06-29 (×21): 5000 [IU] via SUBCUTANEOUS
  Filled 2022-06-22 (×21): qty 1

## 2022-06-22 MED ORDER — METHYLPREDNISOLONE SODIUM SUCC 125 MG IJ SOLR
125.0000 mg | Freq: Once | INTRAMUSCULAR | Status: AC
  Administered 2022-06-22: 125 mg via INTRAVENOUS
  Filled 2022-06-22: qty 2

## 2022-06-22 MED ORDER — LEVETIRACETAM 100 MG/ML PO SOLN: 500.0000 mg | Freq: Every day | ORAL | Status: DC

## 2022-06-22 MED ORDER — PROPOFOL 1000 MG/100ML IV EMUL
5.0000 ug/kg/min | INTRAVENOUS | Status: DC
  Administered 2022-06-22: 5 ug/kg/min via INTRAVENOUS
  Filled 2022-06-22: qty 100

## 2022-06-22 MED ORDER — LEVETIRACETAM 100 MG/ML PO SOLN
750.0000 mg | Freq: Every day | ORAL | Status: DC
  Filled 2022-06-22: qty 10

## 2022-06-22 MED ORDER — ORAL CARE MOUTH RINSE: 15.0000 mL | OROMUCOSAL | Status: DC | PRN

## 2022-06-22 MED ORDER — DIPHENHYDRAMINE HCL 25 MG PO CAPS
25.0000 mg | ORAL_CAPSULE | Freq: Three times a day (TID) | ORAL | Status: DC | PRN
  Administered 2022-06-22: 25 mg via ORAL
  Filled 2022-06-22: qty 1

## 2022-06-22 MED ORDER — PROPOFOL 1000 MG/100ML IV EMUL
5.0000 ug/kg/min | INTRAVENOUS | Status: DC
  Administered 2022-06-22: 20 ug/kg/min via INTRAVENOUS

## 2022-06-22 MED ORDER — ASPIRIN 81 MG PO CHEW
81.0000 mg | CHEWABLE_TABLET | Freq: Every day | ORAL | Status: DC
  Administered 2022-06-22: 81 mg
  Filled 2022-06-22: qty 1

## 2022-06-22 MED ORDER — PROPOFOL 1000 MG/100ML IV EMUL
INTRAVENOUS | Status: AC
  Filled 2022-06-22: qty 100

## 2022-06-22 MED ORDER — FENTANYL CITRATE PF 50 MCG/ML IJ SOSY
50.0000 ug | PREFILLED_SYRINGE | Freq: Once | INTRAMUSCULAR | Status: AC
  Administered 2022-06-22: 50 ug via INTRAVENOUS
  Filled 2022-06-22: qty 1

## 2022-06-22 MED ORDER — CHLORHEXIDINE GLUCONATE CLOTH 2 % EX PADS
6.0000 | MEDICATED_PAD | Freq: Every day | CUTANEOUS | Status: DC
  Administered 2022-06-22 – 2022-06-29 (×8): 6 via TOPICAL

## 2022-06-22 MED ORDER — ROCURONIUM BROMIDE 50 MG/5ML IV SOLN
INTRAVENOUS | Status: AC | PRN
  Administered 2022-06-22: 100 mg via INTRAVENOUS

## 2022-06-22 MED ORDER — TICAGRELOR 90 MG PO TABS
90.0000 mg | ORAL_TABLET | Freq: Two times a day (BID) | ORAL | Status: DC
  Administered 2022-06-22: 90 mg
  Filled 2022-06-22: qty 1

## 2022-06-22 MED ORDER — HYDRALAZINE HCL 20 MG/ML IJ SOLN
10.0000 mg | Freq: Once | INTRAMUSCULAR | Status: AC
  Administered 2022-06-22: 10 mg via INTRAVENOUS
  Filled 2022-06-22: qty 1

## 2022-06-22 MED ORDER — ORAL CARE MOUTH RINSE
15.0000 mL | OROMUCOSAL | Status: DC
  Administered 2022-06-22 (×4): 15 mL via OROMUCOSAL

## 2022-06-22 MED ORDER — DIPHENHYDRAMINE HCL 50 MG/ML IJ SOLN
25.0000 mg | Freq: Once | INTRAMUSCULAR | Status: AC
  Administered 2022-06-22: 25 mg via INTRAVENOUS
  Filled 2022-06-22: qty 1

## 2022-06-22 MED ORDER — DOCUSATE SODIUM 50 MG/5ML PO LIQD
100.0000 mg | Freq: Two times a day (BID) | ORAL | Status: DC | PRN
Start: 2022-06-22 — End: 2022-06-30

## 2022-06-22 MED ORDER — PANTOPRAZOLE SODIUM 40 MG IV SOLR
40.0000 mg | INTRAVENOUS | Status: DC
Start: 2022-06-22 — End: 2022-06-23
  Administered 2022-06-23: 40 mg via INTRAVENOUS
  Filled 2022-06-22: qty 10

## 2022-06-22 MED ORDER — DOCUSATE SODIUM 50 MG/5ML PO LIQD
100.0000 mg | Freq: Two times a day (BID) | ORAL | Status: DC | PRN
Start: 2022-06-22 — End: 2022-06-22

## 2022-06-22 MED ORDER — NOREPINEPHRINE 4 MG/250ML-% IV SOLN
0.0000 ug/min | INTRAVENOUS | Status: DC
  Administered 2022-06-22: 2 ug/min via INTRAVENOUS

## 2022-06-22 MED ORDER — ASPIRIN 81 MG PO CHEW
81.0000 mg | CHEWABLE_TABLET | Freq: Every day | ORAL | Status: DC
  Administered 2022-06-23 – 2022-06-29 (×7): 81 mg via ORAL
  Filled 2022-06-22 (×8): qty 1

## 2022-06-22 MED ORDER — DOCUSATE SODIUM 100 MG PO CAPS: 100.0000 mg | ORAL_CAPSULE | Freq: Two times a day (BID) | ORAL | Status: DC | PRN

## 2022-06-22 MED ORDER — TICAGRELOR 90 MG PO TABS
90.0000 mg | ORAL_TABLET | Freq: Two times a day (BID) | ORAL | Status: DC
  Administered 2022-06-23 – 2022-06-29 (×14): 90 mg via ORAL
  Filled 2022-06-22 (×15): qty 1

## 2022-06-22 MED ORDER — ETOMIDATE 2 MG/ML IV SOLN
INTRAVENOUS | Status: AC | PRN
  Administered 2022-06-22: 20 mg via INTRAVENOUS

## 2022-06-22 MED ORDER — POLYETHYLENE GLYCOL 3350 17 G PO PACK: 17.0000 g | PACK | Freq: Every day | ORAL | Status: DC | PRN

## 2022-06-22 MED ORDER — IOHEXOL 350 MG/ML SOLN
75.0000 mL | Freq: Once | INTRAVENOUS | Status: AC | PRN
  Administered 2022-06-22: 75 mL via INTRAVENOUS

## 2022-06-22 NOTE — Progress Notes (Signed)
Pt admitted to [facility]. Shaking head no, able to make eye contact, f/c in all extremities. Propofol restarted for comfort. Husband at bedside.

## 2022-06-22 NOTE — ED Provider Notes (Signed)
[facility] Emergency Department Provider Note MRN:  [id number]  Arrival date & time: 06/22/22     Chief Complaint   Unresponsive   History of Present Illness   Carolyn Russo is a 71 y.o. year-old female with a history of CHF, ESRD, stroke, PAD presenting to the ED with chief complaint of unresponsive.  Patient recovering from recent right leg amputation.  Has been doing well according to husband.  Went to the gym yesterday for rehab, talked on the phone with husband at 33 PM seemed normal.  When facility went in to wake her up for dialysis this morning she was unresponsive.  Was found to be hypoglycemic with EMS, given glucagon.  Review of Systems  I was unable to obtain a full/accurate HPI, PMH, or ROS due to the patient's unresponsiveness.  Patient's Health History    Past Medical History:  Diagnosis Date   Acute GI bleeding    Allergy    Anemia    Anterior chest wall pain    Appendicitis 1965   Asthma    Body mass index 37.0-37.9, adult    Breast pain    Cataract    both eyes   CHF (congestive heart failure) ([place])    Chronic kidney disease    stage 5 - on dialysis   Cognitive change 04/20/2021   r/t cva 01/3153   Complication of anesthesia    Memory loss after general   Dehydration 2014   Deviated septum 1971   Diabetes mellitus    Dysphagia due to old stroke    easy to get strangled when eating   Dyspnea 2014   Extrinsic asthma    WITH ASTHMA ATTACK   Fibroid 1980   GERD (gastroesophageal reflux disease)    Heart murmur    History of migraine    History of seizure    with stroke   Hx gestational diabetes    Hyperlipidemia    Hypertension 2014   Inguinal hernia 1959   Malaise and fatigue 2014   Non-IgE mediated allergic asthma 2014   Obesity    Pelvic pain    Pregnancy, high-risk 1985   Stroke ([place]) 04/20/2021   (CVA) of right basal ganglia   Tonsillitis 1968   Uterine fibroid 1980   Visual field defect    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  stroke    Past Surgical History:  Procedure Laterality Date   ABDOMINAL AORTOGRAM W/LOWER EXTREMITY N/A 02/21/2022   Procedure: ABDOMINAL AORTOGRAM W/LOWER EXTREMITY;  Surgeon: Waynetta Sandy, MD;  Location: [place] [facility];  Service: Cardiovascular;  Laterality: N/A;   AMPUTATION Right 05/18/2022   Procedure: RIGHT BELOW KNEE AMPUTATION;  Surgeon: Newt Minion, MD;  Location: [place];  Service: Orthopedics;  Laterality: Right;   AMPUTATION TOE Right 04/15/2022   Procedure: AMPUTATION  LST TOERIGHT FOOT;  Surgeon: Criselda Peaches, DPM;  Location: [facility];  Service: Podiatry;  Laterality: Right;   APPENDECTOMY  0086   BASCILIC VEIN TRANSPOSITION Right 04/30/2021   Procedure: RIGHT FIRST STAGE [place];  Surgeon: Angelia Mould, MD;  Location: [place];  Service: Vascular;  Laterality: Right;   CESAREAN SECTION  1985   COLONOSCOPY     ESOPHAGOGASTRODUODENOSCOPY (EGD) WITH PROPOFOL N/A 04/18/2021   Procedure: ESOPHAGOGASTRODUODENOSCOPY (EGD) WITH PROPOFOL;  Surgeon: Gatha Mayer, MD;  Location: [place];  Service: Endoscopy;  Laterality: N/A;   EYE SURGERY     bilateral cataract    FLEXIBLE SIGMOIDOSCOPY N/A  04/18/2021   Procedure: FLEXIBLE SIGMOIDOSCOPY;  Surgeon: Gatha Mayer, MD;  Location: [facility];  Service: Endoscopy;  Laterality: N/A;   HERNIA REPAIR  1959   IR FLUORO GUIDE CV LINE RIGHT  03/18/2021   IR US GUIDE VASC ACCESS RIGHT  03/18/2021   LEFT HEART CATH AND CORONARY ANGIOGRAPHY N/A 04/04/2017   Procedure: Left Heart Cath and Coronary Angiography;  Surgeon: Belva Crome, MD;  Location: [place] [facility];  Service: Cardiovascular;  Laterality: N/A;   [name], 2004, 2007   PERIPHERAL VASCULAR THROMBECTOMY  02/21/2022   Procedure: PERIPHERAL VASCULAR THROMBECTOMY;  Surgeon: Waynetta Sandy, MD;  Location: [place] [facility];  Service: Cardiovascular;;  Right Tibial   RHINOPLASTY  1971   ROTATOR CUFF REPAIR  2003    SURGICAL REPAIR OF HEMORRHAGE  2015   TONSILLECTOMY  1968    Family History  Problem Relation Age of Onset   Cancer Mother        abdominal melamona   Psoriasis Mother    Alzheimer's disease Father    Cancer Cousin        colon    Diabetes Cousin    Diabetes Maternal Aunt    Colon cancer Neg Hx    Colon polyps Neg Hx    Esophageal cancer Neg Hx    Rectal cancer Neg Hx    Stomach cancer Neg Hx    Breast cancer Neg Hx     Social History   Socioeconomic History   Marital status: Married    Spouse name: Carolyn Russo   Number of children: 1   Years of education: Not on file   Highest education level: Professional school degree (e.g., MD, DDS, DVM, JD)  Occupational History   Occupation: retired  Tobacco Use   Smoking status: Never   Smokeless tobacco: Never  Vaping Use   Vaping Use: Never used  Substance and Sexual Activity   Alcohol use: No   Drug use: No   Sexual activity: Not Currently    Birth control/protection: Post-menopausal  Other Topics Concern   Not on file  Social History Narrative   06/21/21 lives with husband Dr Marylynn Pearson, caregiver- Zigmund Daniel   Patient reports she used to walk at [employer] but now due to Covid-19 she is staying inside.   Social Determinants of Health   Financial Resource Strain: Low Risk  (09/02/2021)   Overall Financial Resource Strain (CARDIA)    Difficulty of Paying Living Expenses: Not hard at all  Food Insecurity: No Food Insecurity (09/02/2021)   Hunger Vital Sign    Worried About Running Out of Food in the Last Year: Never true    Ran Out of Food in the Last Year: Never true  Transportation Needs: No Transportation Needs (09/02/2021)   PRAPARE - Hydrologist (Medical): No    Lack of Transportation (Non-Medical): No  Physical Activity: Inactive (09/02/2021)   Exercise Vital Sign    Days of Exercise per Week: 0 days    Minutes of Exercise per Session: 0 min  Stress: Stress Concern Present (09/02/2021)    [place]    Feeling of Stress : To some extent  Social Connections: Socially Integrated (01/09/2019)   Social Connection and Isolation Panel [NHANES]    Frequency of Communication with Friends and Family: More than three times a week    Frequency of Social Gatherings with Friends and Family: More than three  times a week    Attends Religious Services: More than 4 times per year    Active Member of Clubs or Organizations: Yes    Attends Archivist Meetings: More than 4 times per year    Marital Status: Married  Human resources officer Violence: Not At Risk (03/27/2019)   Humiliation, Afraid, Rape, and Kick questionnaire    Fear of Current or Ex-Partner: No    Emotionally Abused: No    Physically Abused: No    Sexually Abused: No     Physical Exam   Vitals:   06/22/22 0710 06/22/22 0715  BP: (!) 236/104   Pulse: 77 81  Resp: 18 18  SpO2: 100% 100%    CONSTITUTIONAL: Ill-appearing, NAD NEURO/PSYCH: Minimal response to pain, no gag reflex EYES:  eyes equal and reactive ENT/NECK:  no LAD, no JVD CARDIO: Regular rate, well-perfused, normal S1 and S2 PULM:  CTAB no wheezing or rhonchi GI/GU:  non-distended, non-tender MSK/SPINE:  No gross deformities, no edema SKIN:  no rash, atraumatic   *Additional and/or pertinent findings included in MDM below  Diagnostic and Interventional Summary    EKG Interpretation  Date/Time:    Ventricular Rate:    PR Interval:    QRS Duration:   QT Interval:    QTC Calculation:   R Axis:     Text Interpretation:         Labs Reviewed  CBC - Abnormal; Notable for the following components:      Result Value   WBC 13.3 (*)    RBC 3.60 (*)    Hemoglobin 11.9 (*)    HCT 35.0 (*)    RDW 18.7 (*)    All other components within normal limits  CBG MONITORING, ED - Abnormal; Notable for the following components:   Glucose-Capillary 115 (*)    All other components within  normal limits  I-STAT CHEM 8, ED - Abnormal; Notable for the following components:   BUN 44 (*)    Creatinine, Ser 7.90 (*)    Glucose, Bld 117 (*)    Calcium, Ion 1.10 (*)    All other components within normal limits  CULTURE, BLOOD (ROUTINE X 2)  CULTURE, BLOOD (ROUTINE X 2)  URINE CULTURE  PROTIME-INR  COMPREHENSIVE METABOLIC PANEL  LACTIC ACID, PLASMA  URINALYSIS, ROUTINE W REFLEX MICROSCOPIC  I-STAT VENOUS BLOOD GAS, ED  TROPONIN I (HIGH SENSITIVITY)    CT HEAD WO CONTRAST (5MM)  Final Result    DG Chest Port 1 View  Final Result    CT ANGIO HEAD NECK W WO CM    (Results Pending)    Medications  etomidate (AMIDATE) injection (20 mg Intravenous Given 06/22/22 0644)  rocuronium (ZEMURON) injection (100 mg Intravenous Given 06/22/22 0644)  propofol (DIPRIVAN) 1000 MG/100ML infusion (5 mcg/kg/min  78 kg Intravenous New Bag/Given 06/22/22 0725)  methylPREDNISolone sodium succinate (SOLU-MEDROL) 125 mg/2 mL injection 125 mg (has no administration in time range)  diphenhydrAMINE (BENADRYL) injection 25 mg (has no administration in time range)     Procedures  /  Critical Care .Critical Care  Performed by: Maudie Flakes, MD Authorized by: Maudie Flakes, MD   Critical care provider statement:    Critical care time (minutes):  35   Critical care was necessary to treat or prevent imminent or life-threatening deterioration of the following conditions:  CNS failure or compromise   Critical care was time spent personally by me on the following activities:  Development of treatment plan  with patient or surrogate, discussions with consultants, evaluation of patient's response to treatment, examination of patient, ordering and review of laboratory studies, ordering and review of radiographic studies, ordering and performing treatments and interventions, pulse oximetry, re-evaluation of patient's condition and review of old charts   ED Course and Medical Decision Making  Initial  Impression and Ddx Differential diagnosis includes intracranial bleeding, pontine stroke, metabolic disarray, infection or hypoglycemia.  Patient's blood sugar was rechecked here in the emergency department, 113.  Despite this still with a significantly diminished mental status, not protecting her airway.  Decision was made to intubate, see separate note by PA Petrucelli.  Case discussed with Dr. Leonel Ramsay, pontine stroke is felt to be unlikely but will obtain CTA along with CT head to further evaluate.  Otherwise, metabolic causes is favored.  Awaiting work-up.  Will need ICU admission.  Signed out to oncoming EDP.  Past medical/surgical history that increases complexity of ED encounter: Stroke, ESRD  Interpretation of Diagnostics I personally reviewed the Chest Xray and my interpretation is as follows: No obvious pneumothorax, well-positioned ET tube    Patient Reassessment and Ultimate Disposition/Management     Signed out to oncoming provider.  Patient management required discussion with the following services or consulting groups:  Neurology  Complexity of Problems Addressed Acute illness or injury that poses threat of life of bodily function  Additional Data Reviewed and Analyzed Further history obtained from: Further history from spouse/family member  Additional Factors Impacting ED Encounter Risk Consideration of hospitalization  Barth Kirks. Sedonia Small, Lucien [email or web address]  Final Clinical Impressions(s) / ED Diagnoses     ICD-10-CM   1. Unresponsiveness  R41.89       ED Discharge Orders     None        Discharge Instructions Discussed with and Provided to Patient:   Discharge Instructions   None      Maudie Flakes, MD 06/22/22 [phone]

## 2022-06-22 NOTE — Consult Note (Signed)
Neurology Consultation Reason for Consult: Unresponsiveness Referring Physician: [name]  CC: Altered mental status  History is obtained from: Chart review, has been  HPI: Carolyn Russo is a 71 y.o. female with a history of ESRD, DM, hypertension, hyperlipidemia who presents with unresponsiveness.  She was in her normal state of health yesterday, ate a good dinner, talk to the husband around 10 PM.  She is undergoing rehab in a rehab facility due to her recent amputation.  This morning, nursing noticed that she was "lethargic."  Blood glucose was checked and was found to be "low" which nursing note indicates was 62.  She was given some glucagon and came up to 97.  She was so lethargic and therefore transferred to the emergency department.  On my evaluation  On arrival, she was found to be unresponsive and not protecting her airway.  She was intubated   LKW: 10 PM last night tpa given?: no, outside of window   Past Medical History:  Diagnosis Date   Acute GI bleeding    Allergy    Anemia    Anterior chest wall pain    Appendicitis 1965   Asthma    Body mass index 37.0-37.9, adult    Breast pain    Cataract    both eyes   CHF (congestive heart failure) ([place])    Chronic kidney disease    stage 5 - on dialysis   Cognitive change 04/20/2021   r/t cva 05/6766   Complication of anesthesia    Memory loss after general   Dehydration 2014   Deviated septum 1971   Diabetes mellitus    Dysphagia due to old stroke    easy to get strangled when eating   Dyspnea 2014   Extrinsic asthma    WITH ASTHMA ATTACK   Fibroid 1980   GERD (gastroesophageal reflux disease)    Heart murmur    History of migraine    History of seizure    with stroke   Hx gestational diabetes    Hyperlipidemia    Hypertension 2014   Inguinal hernia 1959   Malaise and fatigue 2014   Non-IgE mediated allergic asthma 2014   Obesity    Pelvic pain    Pregnancy, high-risk 1985   Stroke ([place]) 04/20/2021    (CVA) of right basal ganglia   Tonsillitis 1968   Uterine fibroid 1980   Visual field defect    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     Family History  Problem Relation Age of Onset   Cancer Mother        abdominal melamona   Psoriasis Mother    Alzheimer's disease Father    Cancer Cousin        colon    Diabetes Cousin    Diabetes Maternal Aunt    Colon cancer Neg Hx    Colon polyps Neg Hx    Esophageal cancer Neg Hx    Rectal cancer Neg Hx    Stomach cancer Neg Hx    Breast cancer Neg Hx      Social History:  reports that she has never smoked. She has never used smokeless tobacco. She reports that she does not drink alcohol and does not use drugs.   Exam: Current vital signs: BP (!) 236/104   Pulse 81   Resp 18   Wt 78 kg   SpO2 100%   BMI 28.62 kg/m  Vital signs in last 24 hours: Pulse Rate:  [65-81] 81 (08/30  0715) Resp:  [14-18] 18 (08/30 0715) BP: (131-236)/(76-108) 236/104 (08/30 0710) SpO2:  [100 %] 100 % (08/30 0715) FiO2 (%):  [100 %] 100 % (08/30 0716) Weight:  [78 kg] 78 kg (08/30 0700)   Physical Exam  Constitutional: Appears well-developed and well-nourished.   Overall she was intubated with rocuronium approximately 1.5 hours prior to exam  Neuro: Mental Status: Does not open eyes or follow commands  cranial Nerves: II: Right pupil very minimally smaller than left, both reactive III,IV, VI: No response to doll's V: VII: Corneals are absent Motor: No response to noxious stimulation Sensory: As above  Deep Tendon Reflexes: Diffusely areflexic  Cerebellar: Does not perform     I have reviewed labs in epic and the results pertinent to this consultation are: BUN 44 Ionized calcium 1.1 Sodium 136 Hemoglobin 13.6 CBG 144 WBC 13.3  I have reviewed the images obtained: CT head-unremarkable  Impression: 71 year old female with acute unresponsiveness of unclear etiology.  Possibilities include hypercarbia, diffuse embolization, basilar occlusion,  nonconvulsive status epilepticus, hyperammonemia, or other metabolic issues  Recommendations: 1) stat CTA head and neck 2) stat LTM EEG 3) MRI brain 4) ammonia, ABG 5) neurology will continue to follow with further recommendations as above results.  This patient is critically ill and at significant risk of neurological worsening, death and care requires constant monitoring of vital signs, hemodynamics,respiratory and cardiac monitoring, neurological assessment, discussion with family, other specialists and medical decision making of high complexity. I spent 60 minutes of neurocritical care time  in the care of  this patient. This was time spent independent of any time provided by nurse practitioner or PA.  Roland Rack, MD [facility] [phone]  If 7pm- 7am, please page neurology on call as listed in [place]. 06/22/2022  8:31 AM

## 2022-06-22 NOTE — Progress Notes (Signed)
Pt's son (on phone) stating he does not believe SBT occurred and that I am "poisioning" pt with medication. (Propofol). Son yelling at [employer] over phone. Call discontinued by RN.

## 2022-06-22 NOTE — Telephone Encounter (Signed)
FYI-  Patient's husband wanted to let Dr. Sharol Given know that patient is in ICU in [facility], 4th floor, Rm#[id number].  Please advise.  Thank you.

## 2022-06-22 NOTE — Progress Notes (Signed)
EEG complete - results pending 

## 2022-06-22 NOTE — Procedures (Signed)
Extubation Procedure Note  Patient Details:   Name: Carolyn Russo DOB: [birth year] MRN: [id number]   Airway Documentation:    Vent end date: 06/22/22 Vent end time: 1530   Evaluation  O2 sats: stable throughout Complications: No apparent complications Patient did tolerate procedure well. Bilateral Breath Sounds: Clear   Patient extubated per MD order & placed on 4L [place]. Patient is alert/oriented & has good cough.  Kathie Dike 06/22/2022, 3:33 PM

## 2022-06-22 NOTE — Progress Notes (Signed)
Report obtained.

## 2022-06-22 NOTE — Consult Note (Signed)
Reason for Consult: ESRD Referring Physician:  Dr. Raymondo Band  Chief Complaint: Confusion  Dialysis Orders: MWF - [facility] 4 hrs 180NRE 350/700 79 kg 2.0 K/2.0 Ca UFP 4 No heparin - Mircera 200 mcg  IV q 2wks - last 04/29/22 as outpt, unclear if she got last admission - Hectorol 46mg IV TIW  Assessment/Plan: 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 DSharol Given  Preferred narcotic agents for pain control are hydromorphone, fentanyl, and methadone. Morphine should not be used. Avoid Baclofen  2. Acute Metabolic Encephalopathy/Confusion:  CT head negative. Intubated for airway protection. 3. ESRD: Continue HD on usual MWF schedule. Will let the pt settle down for today, no absolute indications for RRT. Plan on HD in AM. 4. Anemia of ESRD: continue weekly Aranesp (2077m q Fri). Was on IV iron, stopped 8/5 because of ongoing infection and then restarted. 5. Secondary hyperparathyroidism : CorrCa high, VDRA on hold. Phos at goal. Continue Renvela as binder when she is having GI intake. 6. HTN/volume: BP controlled.  UF with HD as tolerated. 7. Nutrition, protein calorie malnutrition: continue protein supplements.  8. T2DM: Per primary.  9. Hx CVA: On dual ASA/brilinta, and Keppra.    HPI: Carolyn F SERIN THORNELLs an [age].o. female CHF, seizures, DM, prior CVA's w/ left sided weakness, PAD with right BKA, ESRD presenting with confusion and hypoglycemia at SN[facility]. [facility] in the context of right foot gangrene. Patient was noted to be lethargic when getting ready for HD this morning, CBG noted to be in the 40-60's. In the ED she was unresponsive with a leukocytosis of 13.3. She was intubated for airway protection. CT head was negative for any acute process. K 3.8, BUN/Cr 30/7.35, Albumin 2.8, many bacteria in urine + >50WBC's.  ROS Per HPI.  Chemistry and CBC: Creat  Date/Time Value Ref Range Status  10/13/2011 09:03 AM 0.84 0.50 - 1.10 mg/dL Final    Creatinine, Ser  Date/Time Value Ref Range Status  06/22/2022 09:19 AM 7.35 (H) 0.44 - 1.00 mg/dL Final  06/22/2022 06:51 AM 7.90 (H) 0.44 - 1.00 mg/dL Final  06/11/2022 12:10 AM 3.29 (H) 0.44 - 1.00 mg/dL Final  06/09/2022 01:56 AM 3.99 (H) 0.44 - 1.00 mg/dL Final  06/08/2022 03:40 AM 6.72 (H) 0.44 - 1.00 mg/dL Final  06/07/2022 09:07 AM 5.18 (H) 0.44 - 1.00 mg/dL Final  06/06/2022 03:28 AM 7.55 (H) 0.44 - 1.00 mg/dL Final  06/05/2022 01:12 AM 5.80 (H) 0.44 - 1.00 mg/dL Final    Comment:    DELTA CHECK NOTED  06/04/2022 01:32 AM 3.81 (H) 0.44 - 1.00 mg/dL Final    Comment:    DELTA CHECK NOTED  06/03/2022 01:38 AM 5.79 (H) 0.44 - 1.00 mg/dL Final  06/02/2022 07:02 AM 4.58 (H) 0.44 - 1.00 mg/dL Final    Comment:    DIALYSIS  06/01/2022 01:09 PM 9.67 (H) 0.44 - 1.00 mg/dL Final  05/30/2022 03:10 PM 8.67 (H) 0.44 - 1.00 mg/dL Final  05/27/2022 02:35 AM 6.36 (H) 0.44 - 1.00 mg/dL Final  05/25/2022 09:09 AM 7.44 (H) 0.44 - 1.00 mg/dL Final  05/24/2022 05:55 AM 5.08 (H) 0.44 - 1.00 mg/dL Final    Comment:    DELTA CHECK NOTED DIALYSIS   05/23/2022 07:11 AM 9.94 (H) 0.44 - 1.00 mg/dL Final  05/23/2022 01:33 AM 9.65 (H) 0.44 - 1.00 mg/dL Final  05/22/2022 02:04 AM 7.87 (H) 0.44 - 1.00 mg/dL Final  05/21/2022 01:49  AM 5.42 (H) 0.44 - 1.00 mg/dL Final  05/20/2022 03:41 AM 5.58 (H) 0.44 - 1.00 mg/dL Final    Comment:    DELTA CHECK NOTED DIALYSIS   05/19/2022 03:05 AM 9.15 (H) 0.44 - 1.00 mg/dL Final  05/18/2022 04:02 AM 7.38 (H) 0.44 - 1.00 mg/dL Final  05/17/2022 06:04 AM 6.08 (H) 0.44 - 1.00 mg/dL Final  05/16/2022 02:13 AM 8.51 (H) 0.44 - 1.00 mg/dL Final  05/15/2022 09:28 AM 7.47 (H) 0.44 - 1.00 mg/dL Final  05/14/2022 03:47 AM 5.24 (H) 0.44 - 1.00 mg/dL Final  05/13/2022 05:40 AM 7.08 (H) 0.44 - 1.00 mg/dL Final  05/12/2022 03:46 AM 5.07 (H) 0.44 - 1.00 mg/dL Final  05/11/2022 02:37 AM 7.60 (H) 0.44 - 1.00 mg/dL Final  05/10/2022 02:42 PM 7.26 (H) 0.44 - 1.00 mg/dL Final   04/17/2022 11:17 AM 6.98 (H) 0.44 - 1.00 mg/dL Final    Comment:    DELTA CHECK NOTED  04/15/2022 02:32 PM 3.79 (H) 0.44 - 1.00 mg/dL Final  04/13/2022 03:12 PM 3.92 (H) 0.57 - 1.00 mg/dL Final  03/07/2022 08:01 AM 8.05 (H) 0.44 - 1.00 mg/dL Final  02/21/2022 10:02 AM 8.30 (H) 0.44 - 1.00 mg/dL Final  05/26/2021 12:41 PM 5.29 (H) 0.44 - 1.00 mg/dL Final  05/24/2021 12:53 PM 6.14 (H) 0.44 - 1.00 mg/dL Final  05/21/2021 02:08 PM 2.55 (H) 0.44 - 1.00 mg/dL Final    Comment:    DIALYSIS  05/20/2021 11:30 AM 5.96 (H) 0.44 - 1.00 mg/dL Final  05/17/2021 02:44 PM 5.52 (H) 0.44 - 1.00 mg/dL Final  05/07/2021 07:29 AM 6.99 (H) 0.44 - 1.00 mg/dL Final  05/05/2021 11:31 AM 5.78 (H) 0.44 - 1.00 mg/dL Final  05/03/2021 10:44 AM 5.90 (H) 0.44 - 1.00 mg/dL Final  05/01/2021 05:53 AM 5.50 (H) 0.44 - 1.00 mg/dL Final  04/30/2021 05:08 AM 4.82 (H) 0.44 - 1.00 mg/dL Final  04/29/2021 01:25 PM 5.73 (H) 0.44 - 1.00 mg/dL Final  04/27/2021 10:17 AM 7.15 (H) 0.44 - 1.00 mg/dL Final  04/23/2021 04:36 AM 5.18 (H) 0.44 - 1.00 mg/dL Final  04/19/2021 04:13 AM 6.39 (H) 0.44 - 1.00 mg/dL Final  04/18/2021 10:06 AM 6.06 (H) 0.44 - 1.00 mg/dL Final  04/17/2021 02:24 AM 4.08 (H) 0.44 - 1.00 mg/dL Final   Recent Labs  Lab 06/22/22 0651 06/22/22 0838 06/22/22 0919  NA 136 134* 135  K 4.9 3.5 3.8  CL 98  --  98  CO2  --   --  24  GLUCOSE 117*  --  139*  BUN 44*  --  30*  CREATININE 7.90*  --  7.35*  CALCIUM  --   --  9.5   Recent Labs  Lab 06/22/22 0638 06/22/22 0651 06/22/22 0838  WBC 13.3*  --   --   HGB 11.9* 13.6 11.9*  HCT 35.0* 40.0 35.0*  MCV 97.2  --   --   PLT 334  --   --    Liver Function Tests: Recent Labs  Lab 06/22/22 0919  AST 21  ALT 21  ALKPHOS 68  BILITOT 0.6  PROT 6.3*  ALBUMIN 2.8*   No results for input(s): "LIPASE", "AMYLASE" in the last 168 hours. No results for input(s): "AMMONIA" in the last 168 hours. Cardiac Enzymes: No results for input(s): "CKTOTAL",  "CKMB", "CKMBINDEX", "TROPONINI" in the last 168 hours. Iron Studies: No results for input(s): "IRON", "TIBC", "TRANSFERRIN", "FERRITIN" in the last 72 hours. PT/INR: _0 (inr:5)  Xrays/Other Studies: ) Results for orders  placed or performed during the hospital encounter of 06/22/22 (from the past 48 hour(s))  CBG monitoring, ED     Status: Abnormal   Collection Time: 06/22/22  6:33 AM  Result Value Ref Range   Glucose-Capillary 115 (H) 70 - 99 mg/dL    Comment: Glucose reference range applies only to samples taken after fasting for at least 8 hours.  CBC     Status: Abnormal   Collection Time: 06/22/22  6:38 AM  Result Value Ref Range   WBC 13.3 (H) 4.0 - 10.5 K/uL   RBC 3.60 (L) 3.87 - 5.11 MIL/uL   Hemoglobin 11.9 (L) 12.0 - 15.0 g/dL   HCT 35.0 (L) 36.0 - 46.0 %   MCV 97.2 80.0 - 100.0 fL   MCH 33.1 26.0 - 34.0 pg   MCHC 34.0 30.0 - 36.0 g/dL   RDW 18.7 (H) 11.5 - 15.5 %   Platelets 334 150 - 400 K/uL   nRBC 0.0 0.0 - 0.2 %    Comment: Performed at [facility] [address], [place], [name] [address]  Lactic acid, plasma     Status: Abnormal   Collection Time: 06/22/22  6:38 AM  Result Value Ref Range   Lactic Acid, Venous 2.0 (HH) 0.5 - 1.9 mmol/L    Comment: CRITICAL RESULT CALLED TO, READ BACK BY AND VERIFIED WITH LORREN BALDWIN,RN AT 3149 06/12/2022 BY ZBEECH. Performed at [facility], [place] [address] [place], [place] [address]   Protime-INR     Status: None   Collection Time: 06/22/22  6:38 AM  Result Value Ref Range   Prothrombin Time 12.9 11.4 - 15.2 seconds   INR 1.0 0.8 - 1.2    Comment: (NOTE) INR goal varies based on device and disease states. Performed at [facility], [place] [address] [place], [name] [address]   I-stat chem 8, ed     Status: Abnormal   Collection Time: 06/22/22  6:51 AM  Result Value Ref Range   Sodium 136 135 - 145 mmol/L   Potassium 4.9 3.5 - 5.1 mmol/L   Chloride 98 98 - 111 mmol/L   BUN 44 (H) 8 - 23  mg/dL   Creatinine, Ser 7.90 (H) 0.44 - 1.00 mg/dL   Glucose, Bld 117 (H) 70 - 99 mg/dL    Comment: Glucose reference range applies only to samples taken after fasting for at least 8 hours.   Calcium, Ion 1.10 (L) 1.15 - 1.40 mmol/L   TCO2 29 22 - 32 mmol/L   Hemoglobin 13.6 12.0 - 15.0 g/dL   HCT 40.0 36.0 - 46.0 %  POC CBG, ED     Status: Abnormal   Collection Time: 06/22/22  8:06 AM  Result Value Ref Range   Glucose-Capillary 144 (H) 70 - 99 mg/dL    Comment: Glucose reference range applies only to samples taken after fasting for at least 8 hours.  I-Stat arterial blood gas, ED [facility] ED only)     Status: Abnormal   Collection Time: 06/22/22  8:38 AM  Result Value Ref Range   pH, Arterial 7.484 (H) 7.35 - 7.45   pCO2 arterial 40.9 32 - 48 mmHg   pO2, Arterial 502 (H) 83 - 108 mmHg   Bicarbonate 30.7 (H) 20.0 - 28.0 mmol/L   TCO2 32 22 - 32 mmol/L   O2 Saturation 100 %   Acid-Base Excess 7.0 (H) 0.0 - 2.0 mmol/L   Sodium 134 (L) 135 -  145 mmol/L   Potassium 3.5 3.5 - 5.1 mmol/L   Calcium, Ion 1.21 1.15 - 1.40 mmol/L   HCT 35.0 (L) 36.0 - 46.0 %   Hemoglobin 11.9 (L) 12.0 - 15.0 g/dL   Sample type ARTERIAL   Comprehensive metabolic panel     Status: Abnormal   Collection Time: 06/22/22  9:19 AM  Result Value Ref Range   Sodium 135 135 - 145 mmol/L   Potassium 3.8 3.5 - 5.1 mmol/L   Chloride 98 98 - 111 mmol/L   CO2 24 22 - 32 mmol/L   Glucose, Bld 139 (H) 70 - 99 mg/dL    Comment: Glucose reference range applies only to samples taken after fasting for at least 8 hours.   BUN 30 (H) 8 - 23 mg/dL   Creatinine, Ser 7.35 (H) 0.44 - 1.00 mg/dL   Calcium 9.5 8.9 - 10.3 mg/dL   Total Protein 6.3 (L) 6.5 - 8.1 g/dL   Albumin 2.8 (L) 3.5 - 5.0 g/dL   AST 21 15 - 41 U/L   ALT 21 0 - 44 U/L   Alkaline Phosphatase 68 38 - 126 U/L   Total Bilirubin 0.6 0.3 - 1.2 mg/dL   GFR, Estimated 5 (L) >60 mL/min    Comment: (NOTE) Calculated using the CKD-EPI Creatinine Equation (2021)     Anion gap 13 5 - 15    Comment: Performed at [facility], [place] [address], [place], [name] [address]  Troponin I (High Sensitivity)     Status: Abnormal   Collection Time: 06/22/22  9:19 AM  Result Value Ref Range   Troponin I (High Sensitivity) 30 (H) <18 ng/L    Comment: (NOTE) Elevated high sensitivity troponin I (hsTnI) values and significant  changes across serial measurements may suggest ACS but many other  chronic and acute conditions are known to elevate hsTnI results.  Refer to the "Links" section for chest pain algorithms and additional  guidance. Performed at [facility], [place] [address] [place], [place] [address]    CT ANGIO HEAD NECK W WO CM  Result Date: 06/22/2022 CLINICAL DATA:  Neuro deficit, stroke suspected EXAM: CT ANGIOGRAPHY HEAD AND NECK TECHNIQUE: Multidetector CT imaging of the head and neck was performed using the standard protocol during bolus administration of intravenous contrast. Multiplanar CT image reconstructions and MIPs were obtained to evaluate the vascular anatomy. Carotid stenosis measurements (when applicable) are obtained utilizing NASCET criteria, using the distal internal carotid diameter as the denominator. RADIATION DOSE REDUCTION: This exam was performed according to the departmental dose-optimization program which includes automated exposure control, adjustment of the mA and/or kV according to patient size and/or use of iterative reconstruction technique. CONTRAST:  69m OMNIPAQUE IOHEXOL 350 MG/ML SOLN COMPARISON:  Same-day noncontrast CT, MR and MRA head 06/04/2022, CTA head and neck 05/05/2021 FINDINGS: CTA NECK FINDINGS Aortic arch: The imaged aortic arch is normal. The origins of the major branch vessels are patent. The subclavian arteries are patent to the level imaged. Right carotid system: The right common, internal, and external carotid arteries are patent with minimal plaque of the bifurcation but without hemodynamically  significant stenosis or occlusion. There is no dissection or aneurysm. Left carotid system: The left common, internal, and external carotid arteries are patent, without hemodynamically significant stenosis or occlusion. There is no dissection or aneurysm. Vertebral arteries: The vertebral arteries are patent, without hemodynamically significant stenosis or occlusion. There is no dissection or aneurysm. Skeleton: There is no acute  osseous abnormality or suspicious osseous lesion. There is no visible canal hematoma. Other neck: Endotracheal and enteric tubes are noted, incompletely imaged. Fluid in the imaged airway and layering in the sinuses is likely related to intubation. The soft tissues of the neck are unremarkable. Upper chest: The imaged lung apices are clear. Review of the MIP images confirms the above findings CTA HEAD FINDINGS Anterior circulation: There is calcified plaque in the carotid siphons without hemodynamically significant stenosis or occlusion. The bilateral MCAs are patent with mild atherosclerotic irregularity and narrowing but no proximal high-grade stenosis or occlusion. The bilateral ACAs are patent without proximal stenosis or occlusion. There is no aneurysm or AVM. Posterior circulation: The bilateral V4 segments are patent. The basilar artery is patent. The major cerebellar artery origins are patent The bilateral PCAs are patent. There is short-segment moderate stenosis of the left P2 segment (8-134, 12-25). Prominent bilateral posterior communicating arteries are patent. There is no aneurysm or AVM. Venous sinuses: Patent. Anatomic variants: None. Review of the MIP images confirms the above findings IMPRESSION: 1. No emergent large vessel occlusion. 2. Short-segment moderate stenosis of the left P2 segment. Otherwise mild intracranial atherosclerosis with no other significant stenosis. 3. Patent vasculature of the neck without hemodynamically significant stenosis, occlusion, or  dissection. Electronically Signed   By: Valetta Mole M.D.   On: 06/22/2022 08:52   CT HEAD WO CONTRAST (5MM)  Result Date: 06/22/2022 CLINICAL DATA:  Delirium EXAM: CT HEAD WITHOUT CONTRAST TECHNIQUE: Contiguous axial images were obtained from the base of the skull through the vertex without intravenous contrast. RADIATION DOSE REDUCTION: This exam was performed according to the departmental dose-optimization program which includes automated exposure control, adjustment of the mA and/or kV according to patient size and/or use of iterative reconstruction technique. COMPARISON:  Brain MRI 06/03/2022 FINDINGS: Brain: No evidence of acute infarction, hemorrhage, hydrocephalus, extra-axial collection or mass lesion/mass effect. Chronic small vessel ischemia. White matter gliosis is greatest in the frontal lobes. Remote right basal ganglia infarct with wallerian degeneration seen into the right pons based on MRI. Cerebral volume loss. Vascular: No hyperdense vessel or unexpected calcification. Skull: Normal. Negative for fracture or focal lesion. Sinuses/Orbits: No acute finding. IMPRESSION: No acute or reversible finding.  Stable from recent brain MRI. Electronically Signed   By: Jorje Guild M.D.   On: 06/22/2022 07:20   DG Chest Port 1 View  Result Date: 06/22/2022 CLINICAL DATA:  Intubation EXAM: PORTABLE CHEST 1 VIEW COMPARISON:  06/11/2022 FINDINGS: Endotracheal tube with tip 12 mm above the carina. The enteric tube reaches the stomach. Right Perma catheter with tip at the right atrium. Extensive artifact from EKG leads. Low volume chest with hazy density likely reflecting atelectasis. No visible effusion or pneumothorax. Cardiomegaly. IMPRESSION: 1. Endotracheal tube with tip 12 mm above the carina. 2. The enteric tube reaches the stomach. 3. Cardiomegaly and low lung volumes. Electronically Signed   By: Jorje Guild M.D.   On: 06/22/2022 07:16    PMH:   Past Medical History:  Diagnosis Date    Acute GI bleeding    Allergy    Anemia    Anterior chest wall pain    Appendicitis 1965   Asthma    Body mass index 37.0-37.9, adult    Breast pain    Cataract    both eyes   CHF (congestive heart failure) (HCC)    Chronic kidney disease    stage 5 - on dialysis   Cognitive change 04/20/2021   r/t  cva 11/9922   Complication of anesthesia    Memory loss after general   Dehydration 2014   Deviated septum 1971   Diabetes mellitus    Dysphagia due to old stroke    easy to get strangled when eating   Dyspnea 2014   Extrinsic asthma    WITH ASTHMA ATTACK   Fibroid 1980   GERD (gastroesophageal reflux disease)    Heart murmur    History of migraine    History of seizure    with stroke   Hx gestational diabetes    Hyperlipidemia    Hypertension 2014   Inguinal hernia 1959   Malaise and fatigue 2014   Non-IgE mediated allergic asthma 2014   Obesity    Pelvic pain    Pregnancy, high-risk 1985   Stroke ([place]) 04/20/2021   (CVA) of right basal ganglia   Tonsillitis 1968   Uterine fibroid 1980   Visual field defect    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    PSH:   Past Surgical History:  Procedure Laterality Date   ABDOMINAL AORTOGRAM W/LOWER EXTREMITY N/A 02/21/2022   Procedure: ABDOMINAL AORTOGRAM W/LOWER EXTREMITY;  Surgeon: Waynetta Sandy, MD;  Location: [facility];  Service: Cardiovascular;  Laterality: N/A;   AMPUTATION Right 05/18/2022   Procedure: RIGHT BELOW KNEE AMPUTATION;  Surgeon: Newt Minion, MD;  Location: [facility];  Service: Orthopedics;  Laterality: Right;   AMPUTATION TOE Right 04/15/2022   Procedure: AMPUTATION  LST TOERIGHT FOOT;  Surgeon: Criselda Peaches, DPM;  Location: [facility];  Service: Podiatry;  Laterality: Right;   APPENDECTOMY  2683   BASCILIC VEIN TRANSPOSITION Right 04/30/2021   Procedure: RIGHT FIRST STAGE [place];  Surgeon: Angelia Mould, MD;  Location: [facility];  Service: Vascular;  Laterality: Right;   CESAREAN  SECTION  1985   COLONOSCOPY     ESOPHAGOGASTRODUODENOSCOPY (EGD) WITH PROPOFOL N/A 04/18/2021   Procedure: ESOPHAGOGASTRODUODENOSCOPY (EGD) WITH PROPOFOL;  Surgeon: Gatha Mayer, MD;  Location: [facility];  Service: Endoscopy;  Laterality: N/A;   EYE SURGERY     bilateral cataract    FLEXIBLE SIGMOIDOSCOPY N/A 04/18/2021   Procedure: FLEXIBLE SIGMOIDOSCOPY;  Surgeon: Gatha Mayer, MD;  Location: [facility];  Service: Endoscopy;  Laterality: N/A;   HERNIA REPAIR  1959   IR FLUORO GUIDE CV LINE RIGHT  03/18/2021   IR US GUIDE VASC ACCESS RIGHT  03/18/2021   LEFT HEART CATH AND CORONARY ANGIOGRAPHY N/A 04/04/2017   Procedure: Left Heart Cath and Coronary Angiography;  Surgeon: Belva Crome, MD;  Location: [facility];  Service: Cardiovascular;  Laterality: N/A;   [place], 2004, 2007   PERIPHERAL VASCULAR THROMBECTOMY  02/21/2022   Procedure: PERIPHERAL VASCULAR THROMBECTOMY;  Surgeon: Waynetta Sandy, MD;  Location: [place];  Service: Cardiovascular;;  Right Tibial   RHINOPLASTY  1971   ROTATOR CUFF REPAIR  2003   SURGICAL REPAIR OF HEMORRHAGE  2015   TONSILLECTOMY  1968    Allergies:  Allergies  Allergen Reactions   Food Anaphylaxis    Peanuts - anaphylaxis Almonds - anaphylaxis Not listed on MAR   Wheat Bran Other (See Comments)    Upset stomach  Not listed on MAR   Statins Itching and Other (See Comments)    Generalized aches- tolerates crestor  Not listed on MAR   Gadolinium Derivatives Other (See Comments)    Gadolinium-Containing Contrast Media Listed as an allergy on MAR Unknown  reaction   Januvia [Sitagliptin] Other (See Comments)    Listed as an allergy on MAR Unknown reaction   Pork-Derived Products Other (See Comments)    Does not eat pork  Listed on MAR   Shellfish Allergy Other (See Comments)    Mouth gets raw Listed on MAR   Tetracycline Other (See Comments)    Raw mouth Not listed on MAR    Medications:    Prior to Admission medications   Medication Sig Start Date End Date Taking? Authorizing Provider  acetaminophen (TYLENOL) 500 MG tablet Take 1,000 mg by mouth 2 (two) times daily as needed (leg and arm pain).   Yes [provider]  albuterol (VENTOLIN HFA) 108 (90 Base) MCG/ACT inhaler Inhale 2 puffs into the lungs every 4 (four) hours as needed for wheezing or shortness of breath. 04/13/22  Yes Minette Brine, FNP  Alcaftadine (LASTACAFT) 0.25 % SOLN Place 1 drop into both eyes daily as needed (itchy eyes).   Yes [provider]  Amino Acids-Protein Hydrolys (PRO-STAT) LIQD Take 30 mLs by mouth 2 (two) times daily.   Yes [provider]  aspirin EC 81 MG tablet Take 1 tablet (81 mg total) by mouth daily. Swallow whole. Patient taking differently: Take 81 mg by mouth at bedtime. 06/10/22  Yes Shawna Clamp, MD  carvedilol (COREG) 3.125 MG tablet Take 1 tablet (3.125 mg total) by mouth 2 (two) times daily with a meal. 11/01/21  Yes [name], MD  Dextran 70-Hypromellose (LUBRICATING TEARS EYE DROPS) 0.1-0.3 % SOLN Place 1 drop into both eyes daily as needed (dry eyes).   Yes [provider]  diclofenac Sodium (VOLTAREN) 1 % GEL Apply 2 g topically 4 (four) times daily. To left wrist 05/26/21  Yes Love, Ivan Anchors, PA-C  diphenhydrAMINE (BANOPHEN) 25 MG tablet Take 25 mg by mouth at bedtime.   Yes [provider]  diphenhydrAMINE-zinc acetate (BENADRYL) cream Apply 1 Application topically daily as needed for itching.   Yes [provider]  fluticasone (FLONASE) 50 MCG/ACT nasal spray Place 2 sprays into both nostrils daily as needed for allergies.   Yes [provider]  Glucagon, rDNA, (GLUCAGON EMERGENCY) 1 MG KIT Inject 1 mg into the vein as needed (CBG of 90m/dL).   Yes [provider]  insulin aspart (FIASP FLEXTOUCH) 100 UNIT/ML FlexTouch Pen Inject 6-16 Units into the skin See admin instructions. Inject 6-16 units three times  daily as needed per sliding scale: CBG 100-150 : 6 units CBG 151-200 : 10 units CBG 201-250 : 12 units CBG 251-300 : 14 units CBG 301-350 : 16 units CBG > 350     : 16 units and call [place]   Yes [provider]  insulin glargine (LANTUS) 100 UNIT/ML Solostar Pen Inject 13 Units into the skin daily. Patient taking differently: Inject 13 Units into the skin at bedtime. 08/18/21  Yes Ghumman, Ramandeep, NP  levETIRAcetam (KEPPRA) 500 MG tablet TAKE 1 TABLET(500 MG) BY MOUTH AT BEDTIME Patient taking differently: Take 500 mg by mouth at bedtime. 01/07/22  Yes Lovorn, MJinny Blossom MD  lidocaine (LMX) 4 % cream Apply 1 Application topically as needed (pain).   Yes [provider]  losartan (COZAAR) 100 MG tablet Take 1 tablet (100 mg total) by mouth daily. Patient taking differently: Take 100 mg by mouth at bedtime. 06/10/22  Yes KShawna Clamp MD  melatonin 5 MG TABS Take 5 mg by mouth at bedtime.   Yes [provider]  methylphenidate (  RITALIN) 5 MG tablet Take 1 tablet (5 mg total) by mouth 2 (two) times daily with breakfast and lunch. Due to stroke attention Patient taking differently: Take 5 mg by mouth 2 (two) times daily with breakfast and lunch. 03/04/22  Yes Lovorn, Jinny Blossom, MD  mometasone-formoterol (DULERA) 200-5 MCG/ACT AERO Inhale 2 puffs into the lungs 2 (two) times daily. 04/15/22  Yes [name], MD  ondansetron (ZOFRAN-ODT) 4 MG disintegrating tablet Take 1 tablet (4 mg total) by mouth every 8 (eight) hours as needed for nausea or vomiting. 03/07/22  Yes Curatolo, Adam, DO  oxyCODONE (OXY IR/ROXICODONE) 5 MG immediate release tablet Take 0.5 tablets (2.5 mg total) by mouth every 4 (four) hours as needed for moderate pain. 06/10/22  Yes Shawna Clamp, MD  pantoprazole (PROTONIX) 40 MG tablet Take 1 tablet (40 mg total) by mouth daily. Patient taking differently: Take 40 mg by mouth at bedtime. 02/07/22  Yes [name], MD  rosuvastatin (CRESTOR) 20 MG tablet  Take 1 tablet (20 mg total) by mouth daily. Patient taking differently: Take 20 mg by mouth at bedtime. 08/18/21  Yes Ghumman, Ramandeep, NP  senna-docusate (SENOKOT-S) 8.6-50 MG tablet Take 2 tablets by mouth 2 (two) times daily. 06/29/21  Yes Ghumman, Ramandeep, NP  sevelamer carbonate (RENVELA) 0.8 g PACK packet Take 0.8 g by mouth 3 (three) times daily. 10/19/21  Yes [provider]  thiamine (VITAMIN B-1) 100 MG tablet Take 1 tablet (100 mg total) by mouth daily. 06/11/22  Yes Shawna Clamp, MD  ticagrelor (BRILINTA) 90 MG TABS tablet Take 1 tablet (90 mg total) by mouth 2 (two) times daily. 06/10/22  Yes Shawna Clamp, MD  cefadroxil (DURICEF) 500 MG capsule Take 2 capsules (1,000 mg total) by mouth every Monday, Wednesday, and Friday with hemodialysis. Patient not taking: Reported on 06/22/2022 05/04/22   Criselda Peaches, DPM  potassium chloride SA (KLOR-CON M) 20 MEQ tablet Take 1-2 tablets (20-40 mEq total) by mouth daily as needed (low potassium level). Patient not taking: Reported on 06/22/2022 06/10/22   Shawna Clamp, MD  iron polysaccharides (NIFEREX) 150 MG capsule Take 1 capsule (150 mg total) by mouth daily. Patient not taking: No sig reported 03/24/21 04/16/21  Nita Sells, MD    Discontinued Meds:   Medications Discontinued During This Encounter  Medication Reason   Continuous Blood Gluc Receiver (FREESTYLE LIBRE 2 READER) DEVI    Insulin Pen Needle (BD PEN NEEDLE NANO 2ND GEN) 32G X 4 MM MISC    OneTouch Delica Lancets 09T MISC    fluticasone (FLONASE) 50 MCG/ACT nasal spray Discontinued by provider   Alcaftadine (LASTACAFT) 0.25 % SOLN Discontinued by provider   ARTIFICIAL TEAR SOLUTION OP    diphenhydrAMINE (BENADRYL) 2 % cream    Lidocaine 4 % GEL Discontinued by provider   ondansetron (ZOFRAN) 8 MG tablet Discontinued by provider   melatonin 5 MG TABS Discontinued by provider   diphenhydrAMINE (BENADRYL) 25 MG tablet Discontinued by provider   Insulin  Aspart, w/Niacinamide, (FIASP) 100 UNIT/ML SOLN    Nutritional Supplements (,FEEDING SUPPLEMENT, PROSOURCE PLUS) liquid Discontinued by provider   iron sucrose in sodium chloride 0.9 % 100 mL    Methoxy PEG-Epoetin Beta (MIRCERA IJ)    docusate sodium (COLACE) capsule 100 mg     Social History:  reports that she has never smoked. She has never used smokeless tobacco. She reports that she does not drink alcohol and does not use drugs.  Family History:   Family History  Problem  Relation Age of Onset   Cancer Mother        abdominal melamona   Psoriasis Mother    Alzheimer's disease Father    Cancer Cousin        colon    Diabetes Cousin    Diabetes Maternal Aunt    Colon cancer Neg Hx    Colon polyps Neg Hx    Esophageal cancer Neg Hx    Rectal cancer Neg Hx    Stomach cancer Neg Hx    Breast cancer Neg Hx     Blood pressure (!) 163/66, pulse 73, temperature (!) 92.9 F (33.8 C), resp. rate 18, weight 78 kg, SpO2 100 %. General: She is spontaneously opening her eyes, intubated Heart: RRR, no murmurs, rubs or gallops Lungs: CTA bilaterally without wheezing Abdomen: Soft, non-distended, +BS Extremities: No significant edema, rt BKA Dialysis Access: RIJ [facility] Neuro: intubated but not sedated, opening eyes spontaneously but not following commands.       Dwana Melena, MD 06/22/2022, 10:25 AM

## 2022-06-22 NOTE — Progress Notes (Signed)
Ventilator patient transported from ED20 to CT2 and back without any complications.

## 2022-06-22 NOTE — ED Notes (Signed)
Neuro paged to RN per her request

## 2022-06-22 NOTE — H&P (Signed)
NAME:  Carolyn Russo, MRN:  [id number], DOB:  [birth year], LOS: 0 ADMISSION DATE:  06/22/2022, CONSULTATION DATE:  06/22/22 REFERRING MD:  Sedonia Small- EDP, CHIEF COMPLAINT:  unresponsive   History of Present Illness:  Carolyn Russo is a 71 yo female with CHF, ESRD on HD, seizures, type 2 diabetes, previous CVAs with residual left sided weakness, and hx of R BKA who presents to [facility] after being hypoglycemic and confused at [place] (SNF). History was obtained from the patient's husband, Dr. Marylynn Pearson, at the bedside. He states that he last heard from the patient around 10p yesterday and she was doing well and was at her baseline. She was not complaining of anything nor has she had any recent illnesses. She has been at [facility] for the past week and a half after she had a R BKA done after having gangrene of the right foot. The patient had been recovering well, and was even able to go to the [place] gym yesterday, with assistance. This morning, the nurse was going to wake the patient to get her ready for dialysis, however, she was very "lethargic". Reportedly, her CBG was in the 40s (although nursing notes indicate it was in the 60s) at that time and then she was given glucagon and brought to the ED.   In the ED, the patient was found to be unresponsive. Her CBG was 115. CBC with leukocytosis to 13.3, Hb 11.9, lactic acid 2.0, BUN 44, and electrolytes wnl. With the patient remaining unresponsive, decision was made to intubate her to protect her airway. CTH in ED was negative and CTA showed no large vessel occlusion.   Pertinent  Medical History  ESRD on HD, T2DM, previous CVAs, HFpEF, seizures  Significant Hospital Events: Including procedures, antibiotic start and stop dates in addition to other pertinent events   8/30 unresponsive and intubated in the ED  Interim History / Subjective:  As above  Objective   Blood pressure (!) 236/104, pulse 81, resp. rate 18, weight 78 kg, SpO2 100 %.     Vent Mode: PRVC FiO2 (%):  [100 %] 100 % Set Rate:  [18 bmp] 18 bmp Vt Set:  [450 mL-500 mL] 450 mL PEEP:  [5 cmH20] 5 cmH20 Plateau Pressure:  [18 cmH20-19 cmH20] 18 cmH20  No intake or output data in the 24 hours ending 06/22/22 0807 Filed Weights   06/22/22 0700  Weight: 78 kg    Examination: General: Sedated, ill appearing female on vent.  HENT: ETT to vent. R subclavian HD cath Lungs: CTAB, no wheezes or rhonchi Cardiovascular: Regular rate, rhythm. No murmurs.  Abdomen: Soft, nontender, nondistended. +BS Extremities: S/p R BKA. Left extremity warm, no edema. Neuro: No gag, cough reflex. PERRL. No response to painful stimuli GU: Foley in place   Resolved Hospital Problem list     Assessment & Plan:   Acute encephalopathy Hypoglycemia Severe hypertension  Patient found lethargic/unresponsive and hypoglycemic at SNF. She was given dextrose with no significant improvement in her mental status. SBP in the 220s in the ED. Suspect her encephalopathy is secondary to either prolonged hypoglycemia vs severe hypertension/PRES vs seizures/status epilepticus vs other metabolic etiologies. CT head and CTA negative for bleed or CVA. Patient intubated in the ED due to failure to protect her airway. Vent settings: PRVC, RR 18, Vt 450, FiO2 40%.  - MRI brain to rule out PRES - Long term EEG  - Mentation precludes extubation/SBT at this time - Neurology following - Follow blood  cultures  ESRD on HD MWF Patient has not missed any dialysis sessions, aside from this morning. - Consulted nephrology for inpatient dialysis  Type 2 diabetes A1c 4.6%, although likely falsely low in the setting of ESRD. At home, patient is on Lantus 13u daily and SSI. Wonder if patient is on too much insulin and prolonged hypoglycemia is the cause of her acute encephalopathy.  - NPO - Hold insulin given hypoglycemic episodes  - CBG monitoring  Seizures On keppra 500 mg qhs at home. - Long term  EEG  Hypothermia Temp down to 92.68F in the ED. - Check cortisol - Bear hugger  Elevated troponin Initial troponin elevated to 30. - Delta trop pending   Best Practice (right click and "Reselect all SmartList Selections" daily)   Diet/type: NPO DVT prophylaxis: prophylactic heparin  GI prophylaxis: PPI Lines: N/A Foley:  N/A Code Status:  full code Last date of multidisciplinary goals of care discussion [updated patient's husband at the bedside 8/30]  Labs   CBC: Recent Labs  Lab 06/22/22 0638 06/22/22 0651  WBC 13.3*  --   HGB 11.9* 13.6  HCT 35.0* 40.0  MCV 97.2  --   PLT 334  --     Basic Metabolic Panel: Recent Labs  Lab 06/22/22 0651  NA 136  K 4.9  CL 98  GLUCOSE 117*  BUN 44*  CREATININE 7.90*   GFR: Estimated Creatinine Clearance: 6.5 mL/min (A) (by C-G formula based on SCr of 7.9 mg/dL (H)). Recent Labs  Lab 06/22/22 0638  WBC 13.3*  LATICACIDVEN 2.0*    Liver Function Tests: No results for input(s): "AST", "ALT", "ALKPHOS", "BILITOT", "PROT", "ALBUMIN" in the last 168 hours. No results for input(s): "LIPASE", "AMYLASE" in the last 168 hours. No results for input(s): "AMMONIA" in the last 168 hours.  ABG    Component Value Date/Time   PHART 7.382 05/31/2022 1049   PCO2ART 38.6 05/31/2022 1049   PO2ART 127 (H) 05/31/2022 1049   HCO3 22.9 05/31/2022 1049   TCO2 29 06/22/2022 0651   ACIDBASEDEF 2.0 05/31/2022 1049   O2SAT 99 05/31/2022 1049     Coagulation Profile: Recent Labs  Lab 06/22/22 0638  INR 1.0    Cardiac Enzymes: No results for input(s): "CKTOTAL", "CKMB", "CKMBINDEX", "TROPONINI" in the last 168 hours.  HbA1C: Hemoglobin A1C  Date/Time Value Ref Range Status  12/22/2020 12:00 AM 8.6  Final  06/19/2020 12:00 AM 9.6  Final   Hgb A1c MFr Bld  Date/Time Value Ref Range Status  06/04/2022 01:32 AM 4.6 (L) 4.8 - 5.6 % Final    Comment:    (NOTE) Pre diabetes:          5.7%-6.4%  Diabetes:               >6.4%  Glycemic control for   <7.0% adults with diabetes   06/03/2022 12:15 PM 4.7 (L) 4.8 - 5.6 % Final    Comment:    REPEATED TO VERIFY (NOTE) Pre diabetes:          5.7%-6.4%  Diabetes:              >6.4%  Glycemic control for   <7.0% adults with diabetes     CBG: Recent Labs  Lab 06/22/22 0633  GLUCAP 115*    Review of Systems:   Unable to obtain   Past Medical History:  She,  has a past medical history of Acute GI bleeding, Allergy, Anemia, Anterior chest wall pain, Appendicitis (1965),  Asthma, Body mass index 37.0-37.9, adult, Breast pain, Cataract, CHF (congestive heart failure) ([place]), Chronic kidney disease, Cognitive change (16/07/9603), Complication of anesthesia, Dehydration (2014), Deviated septum (1971), Diabetes mellitus, Dysphagia due to old stroke, Dyspnea (2014), Extrinsic asthma, Fibroid (1980), GERD (gastroesophageal reflux disease), Heart murmur, History of migraine, History of seizure, gestational diabetes, Hyperlipidemia, Hypertension (2014), Inguinal hernia (1959), Malaise and fatigue (2014), Non-IgE mediated allergic asthma (2014), Obesity, Pelvic pain, Pregnancy, high-risk (1985), Stroke ([place]) (04/20/2021), Tonsillitis (1968), Uterine fibroid (1980), and Visual field defect.   Surgical History:   Past Surgical History:  Procedure Laterality Date   ABDOMINAL AORTOGRAM W/LOWER EXTREMITY N/A 02/21/2022   Procedure: ABDOMINAL AORTOGRAM W/LOWER EXTREMITY;  Surgeon: Waynetta Sandy, MD;  Location: [place] [facility];  Service: Cardiovascular;  Laterality: N/A;   AMPUTATION Right 05/18/2022   Procedure: RIGHT BELOW KNEE AMPUTATION;  Surgeon: Newt Minion, MD;  Location: [facility];  Service: Orthopedics;  Laterality: Right;   AMPUTATION TOE Right 04/15/2022   Procedure: AMPUTATION  LST TOERIGHT FOOT;  Surgeon: Criselda Peaches, DPM;  Location: [facility];  Service: Podiatry;  Laterality: Right;   APPENDECTOMY  5409   BASCILIC VEIN TRANSPOSITION Right  04/30/2021   Procedure: RIGHT FIRST STAGE [place];  Surgeon: Angelia Mould, MD;  Location: [facility];  Service: Vascular;  Laterality: Right;   CESAREAN SECTION  1985   COLONOSCOPY     ESOPHAGOGASTRODUODENOSCOPY (EGD) WITH PROPOFOL N/A 04/18/2021   Procedure: ESOPHAGOGASTRODUODENOSCOPY (EGD) WITH PROPOFOL;  Surgeon: Gatha Mayer, MD;  Location: [facility];  Service: Endoscopy;  Laterality: N/A;   EYE SURGERY     bilateral cataract    FLEXIBLE SIGMOIDOSCOPY N/A 04/18/2021   Procedure: FLEXIBLE SIGMOIDOSCOPY;  Surgeon: Gatha Mayer, MD;  Location: [facility];  Service: Endoscopy;  Laterality: N/A;   HERNIA REPAIR  1959   IR FLUORO GUIDE CV LINE RIGHT  03/18/2021   IR US GUIDE VASC ACCESS RIGHT  03/18/2021   LEFT HEART CATH AND CORONARY ANGIOGRAPHY N/A 04/04/2017   Procedure: Left Heart Cath and Coronary Angiography;  Surgeon: Belva Crome, MD;  Location: [place] [facility];  Service: Cardiovascular;  Laterality: N/A;   [place], 2004, 2007   PERIPHERAL VASCULAR THROMBECTOMY  02/21/2022   Procedure: PERIPHERAL VASCULAR THROMBECTOMY;  Surgeon: Waynetta Sandy, MD;  Location: [place] [facility];  Service: Cardiovascular;;  Right Tibial   RHINOPLASTY  1971   ROTATOR CUFF REPAIR  2003   SURGICAL REPAIR OF HEMORRHAGE  2015   TONSILLECTOMY  1968     Social History:   reports that she has never smoked. She has never used smokeless tobacco. She reports that she does not drink alcohol and does not use drugs.   Family History:  Her family history includes Alzheimer's disease in her father; Cancer in her cousin and mother; Diabetes in her cousin and maternal aunt; Psoriasis in her mother. There is no history of Colon cancer, Colon polyps, Esophageal cancer, Rectal cancer, Stomach cancer, or Breast cancer.   Allergies Allergies  Allergen Reactions   Food Anaphylaxis    Peanuts; Almonds   Wheat Bran     Upset stomach    Statins Itching and Other (See  Comments)    Generalized aches- tolerates crestor    Pork-Derived Products     Does not eat pork    Shellfish Allergy Other (See Comments)    Mouth gets raw   Sitagliptin Other (See Comments)    Januiva - unknown  reaction    Tetracycline Other (See Comments)    Raw mouth   Contrast Media [Iodinated Contrast Media] Rash    Happened during CT scan over 30 years ago     Home Medications  Prior to Admission medications   Medication Sig Start Date End Date Taking? Authorizing Provider  acetaminophen (TYLENOL) 500 MG tablet Take 1,000 mg by mouth 2 (two) times daily as needed (leg and arm pain).   Yes [provider]  albuterol (VENTOLIN HFA) 108 (90 Base) MCG/ACT inhaler Inhale 2 puffs into the lungs every 4 (four) hours as needed for wheezing or shortness of breath. 04/13/22  Yes Minette Brine, FNP  Alcaftadine (LASTACAFT) 0.25 % SOLN Place 1 drop into both eyes daily as needed (itchy eyes).   Yes [provider]  Amino Acids-Protein Hydrolys (PRO-STAT) LIQD Take 30 mLs by mouth 2 (two) times daily.   Yes [provider]  aspirin EC 81 MG tablet Take 1 tablet (81 mg total) by mouth daily. Swallow whole. Patient taking differently: Take 81 mg by mouth at bedtime. 06/10/22  Yes Shawna Clamp, MD  carvedilol (COREG) 3.125 MG tablet Take 1 tablet (3.125 mg total) by mouth 2 (two) times daily with a meal. 11/01/21  Yes [name], MD  Dextran 70-Hypromellose (LUBRICATING TEARS EYE DROPS) 0.1-0.3 % SOLN Place 1 drop into both eyes daily as needed (dry eyes).   Yes [provider]  diclofenac Sodium (VOLTAREN) 1 % GEL Apply 2 g topically 4 (four) times daily. To left wrist 05/26/21  Yes Love, Ivan Anchors, PA-C  diphenhydrAMINE (BANOPHEN) 25 MG tablet Take 25 mg by mouth at bedtime.   Yes [provider]  diphenhydrAMINE-zinc acetate (BENADRYL) cream Apply 1 Application topically daily as needed for itching.   Yes [provider]  fluticasone  (FLONASE) 50 MCG/ACT nasal spray Place 2 sprays into both nostrils daily as needed for allergies.   Yes [provider]  Glucagon, rDNA, (GLUCAGON EMERGENCY) 1 MG KIT Inject 1 mg into the vein as needed (CBG of 73m/dL).   Yes [provider]  insulin aspart (FIASP FLEXTOUCH) 100 UNIT/ML FlexTouch Pen Inject 6-16 Units into the skin See admin instructions. Inject 6-16 units three times daily as needed per sliding scale: CBG 100-150 : 6 units CBG 151-200 : 10 units CBG 201-250 : 12 units CBG 251-300 : 14 units CBG 301-350 : 16 units CBG > 350     : 16 units and call [place]   Yes [provider]  insulin glargine (LANTUS) 100 UNIT/ML Solostar Pen Inject 13 Units into the skin daily. Patient taking differently: Inject 13 Units into the skin at bedtime. 08/18/21  Yes Ghumman, Ramandeep, NP  levETIRAcetam (KEPPRA) 500 MG tablet TAKE 1 TABLET(500 MG) BY MOUTH AT BEDTIME Patient taking differently: Take 500 mg by mouth at bedtime. 01/07/22  Yes Lovorn, MJinny Blossom MD  lidocaine (LMX) 4 % cream Apply 1 Application topically as needed (pain).   Yes [provider]  losartan (COZAAR) 100 MG tablet Take 1 tablet (100 mg total) by mouth daily. Patient taking differently: Take 100 mg by mouth at bedtime. 06/10/22  Yes KShawna Clamp MD  melatonin 5 MG TABS Take 5 mg by mouth at bedtime.   Yes [provider]  methylphenidate (RITALIN) 5 MG tablet Take 1 tablet (5 mg total) by mouth 2 (two) times daily with breakfast and lunch. Due to stroke attention Patient taking differently: Take 5 mg by mouth 2 (two) times daily  with breakfast and lunch. 03/04/22  Yes Lovorn, Jinny Blossom, MD  mometasone-formoterol (DULERA) 200-5 MCG/ACT AERO Inhale 2 puffs into the lungs 2 (two) times daily. 04/15/22  Yes [name], MD  ondansetron (ZOFRAN-ODT) 4 MG disintegrating tablet Take 1 tablet (4 mg total) by mouth every 8 (eight) hours as needed for nausea or vomiting. 03/07/22  Yes Curatolo,  Adam, DO  oxyCODONE (OXY IR/ROXICODONE) 5 MG immediate release tablet Take 0.5 tablets (2.5 mg total) by mouth every 4 (four) hours as needed for moderate pain. 06/10/22  Yes Shawna Clamp, MD  pantoprazole (PROTONIX) 40 MG tablet Take 1 tablet (40 mg total) by mouth daily. Patient taking differently: Take 40 mg by mouth at bedtime. 02/07/22  Yes [name], MD  rosuvastatin (CRESTOR) 20 MG tablet Take 1 tablet (20 mg total) by mouth daily. Patient taking differently: Take 20 mg by mouth at bedtime. 08/18/21  Yes Ghumman, Ramandeep, NP  senna-docusate (SENOKOT-S) 8.6-50 MG tablet Take 2 tablets by mouth 2 (two) times daily. 06/29/21  Yes Ghumman, Ramandeep, NP  sevelamer carbonate (RENVELA) 0.8 g PACK packet Take 0.8 g by mouth 3 (three) times daily. 10/19/21  Yes [provider]  thiamine (VITAMIN B-1) 100 MG tablet Take 1 tablet (100 mg total) by mouth daily. 06/11/22  Yes Shawna Clamp, MD  ticagrelor (BRILINTA) 90 MG TABS tablet Take 1 tablet (90 mg total) by mouth 2 (two) times daily. 06/10/22  Yes Shawna Clamp, MD  cefadroxil (DURICEF) 500 MG capsule Take 2 capsules (1,000 mg total) by mouth every Monday, Wednesday, and Friday with hemodialysis. Patient not taking: Reported on 06/22/2022 05/04/22   Criselda Peaches, DPM  potassium chloride SA (KLOR-CON M) 20 MEQ tablet Take 1-2 tablets (20-40 mEq total) by mouth daily as needed (low potassium level). 06/10/22   Shawna Clamp, MD  iron polysaccharides (NIFEREX) 150 MG capsule Take 1 capsule (150 mg total) by mouth daily. Patient not taking: No sig reported 03/24/21 04/16/21  Nita Sells, MD     Critical care time: 40 mins

## 2022-06-22 NOTE — Progress Notes (Signed)
LTM EEG hooked up and running - no initial skin breakdown - push button tested - neuro notified. [facility] monitoring.  

## 2022-06-22 NOTE — Progress Notes (Signed)
Secure chatted ED RN that I am ready for report

## 2022-06-22 NOTE — ED Notes (Signed)
Pt in room intubated. Husband at bedside. MD at bedside explaining care with son on the phone.  Pt undressed to assess R BKA. Wound healing. No signs of infection  0800 Pt suctioned. Clear thick secretions present. Pt tolerated well. No gag reflex present.  0844 Rectal temp obtained. Hemorrhoids seen. MD notified. Neurosurgeon applied.

## 2022-06-22 NOTE — Progress Notes (Addendum)
  Transition of Care [facility]) Screening Note   Patient Details  Name: Carolyn Russo Date of Birth: 04/28/1951   Transition of Care [facility]) CM/SW Contact:    Benard Halsted, LCSW Phone Number: 06/22/2022, 2:52 PM    Transition of Care Department [facility]) has reviewed patient who has been at [facility] for rehab. We will continue to monitor patient advancement through interdisciplinary progression rounds. If new patient transition needs arise, please place a TOC consult.

## 2022-06-22 NOTE — ED Provider Notes (Signed)
Patient handed off to me at 7 AM.  Patient has been intubated for GCS of 3.  History of heart failure, end-stage renal disease, stroke, peripheral arterial disease, recent right lower leg amputation.  Patient came in with normal vitals.  But obtunded.  Not hypoxic.  Blood sugar supposedly 65 at her rehab facility.  She was given glucagon with EMS.  Blood sugar above 100 upon arrival here.  Did not improve her mental status.  After talking with patient's husband overnight doctore States that patient was conversational on the phone with him at 10:00 last night.  Differential is wide including stroke, infectious process, metabolic process, seizure/postictal process.  Patient has already been intubated, has already had a head CT that is unremarkable.  Work thus far shows white count of 13.3, lactic acid 2, renal function fairly at baseline.  Chest x-ray without evidence of pneumonia.  There is a suspected allergy to contrast however seems like she is got some contrasted studies recently.  Talked on the phone with Dr. Leonel Ramsay with neurology and we will do some premedication but overall since she is intubated risk and benefit to do CTA of head and neck was weighed and will go ahead with imaging to further evaluate for large vessel occlusion.  On my evaluation I have talked with the husband and the patient's son.  I have updated them that were not quite sure the cause of her unresponsiveness.  I have talked with the ICU team who can take over care.  Will obtain CTA of head and neck, EEG and eventually MRI.  Her surgical site in the right lower leg looks very well.  This area is clean dry and intact.  There is no sign of infectious process here.  Patient placed on propofol.  Given hydralazine, fentanyl for further pain control and blood pressure control.  Blood pressure elevated in the low 200s.  Overall at this time patient is pending CTA of head and neck and further work-up for her unresponsiveness.  Admitted to ICU  team.  Please see their note for further results, evaluation, disposition of the patient.  This chart was dictated using voice recognition software.  Despite best efforts to proofread,  errors can occur which can change the documentation meaning.     Lennice Sites, DO 06/22/22 [phone]

## 2022-06-22 NOTE — ED Provider Notes (Signed)
Please see Dr. Wende Bushy note for full H&P.  Intubation performed w/ attending @ bedside.   Procedures  Procedure Name: Intubation Date/Time: 06/22/2022 6:44 AM  Performed by: Amaryllis Dyke, PA-CPre-anesthesia Checklist: Patient identified, Patient being monitored, Emergency Drugs available and Suction available Oxygen Delivery Method: Non-rebreather mask Preoxygenation: Pre-oxygenation with 100% oxygen Induction Type: Rapid sequence Ventilation: Mask ventilation without difficulty Laryngoscope Size: Glidescope and 4 Grade View: Grade I Tube size: 7.5 mm Number of attempts: 1 Airway Equipment and Method: Rigid stylet Placement Confirmation: ETT inserted through vocal cords under direct vision, CO2 detector and Breath sounds checked- equal and bilateral Secured at: 25 (@ the lip) cm Dental Injury: Teeth and Oropharynx as per pre-operative assessment              Amaryllis Dyke, PA-C 06/22/22 0715    Maudie Flakes, MD 06/22/22 [phone]

## 2022-06-22 NOTE — Progress Notes (Signed)
MD at bedside. 

## 2022-06-22 NOTE — Progress Notes (Signed)
Ventilator patient transported from ED20 to MRI and to 4N32 without any complications.

## 2022-06-22 NOTE — ED Triage Notes (Signed)
Pt arrived via [facility] from [place] with c/c of unresponsiveness. Per EMS pt normally is in wheelchair but confused.  Right leg Amputee as of 2 weekas ago. Dialysis pt M-W-F.  CBG 62- 97 Glucagon

## 2022-06-22 NOTE — ED Notes (Signed)
Pt began to wake up. Able to follow simple commands and recognized Husband. MD team Contacted. Pt able to go back on prop if becomes agitated. Margaretha Sheffield RN notified. Pt OTF to MRI.

## 2022-06-22 NOTE — Procedures (Signed)
Patient Name: Carolyn Russo  MRN: [id number]  Epilepsy Attending: [name]  Referring Physician/Provider: Greta Doom, MD  Date: 06/22/2022 Duration: 26.55 mins  Patient history: 71 year old female with acute unresponsiveness of unclear etiology.  EEG to evaluate for seizure.  Level of alertness:  lethargic   AEDs during EEG study: None   Technical aspects: This EEG study was done with scalp electrodes positioned according to the 10-20 International system of electrode placement. Electrical activity was reviewed with band pass filter of 1-'70Hz'$ , sensitivity of 7 uV/mm, display speed of 25m/sec with a '60Hz'$  notched filter applied as appropriate. EEG data were recorded continuously and digitally stored.  Video monitoring was available and reviewed as appropriate.  Description: The posterior dominant rhythm consists of 8-9 Hz activity of moderate voltage (25-35 uV) seen predominantly in posterior head regions, symmetric and reactive to eye opening and eye closing.  EEG showed intermittent generalized and maximal right temporal parietal region 3 to 5 Hz theta-delta slowing.  Frequent spikes were noted in right temporoparietal region.  Hyperventilation and photic stimulation were not performed.     ABNORMALITY - Spike, right temporoparietal region - Intermittent slow, generalized and maximal right temporoparietal region  IMPRESSION: This study showed evidence of epileptogenicity and cortical dysfunction in right temporoparietal region.  Additionally there is mild diffuse encephalopathy, nonspecific etiology.  No seizures  were seen throughout the recording.  Shene Maxfield OBarbra Sarks

## 2022-06-22 NOTE — Progress Notes (Signed)
eLink Physician-Brief Progress Note Patient Name: KETURAH YERBY DOB: 11/18/1950 MRN: [id number]   Date of Service  06/22/2022  HPI/Events of Note  Multiple issues: 1. Nursing request for diet - Patient extubated earlier today and passed bedside swallow evaluation. 2. Patient c/o itching. Patient takes Benadryl at home.  eICU Interventions  Plan: Clear liquid renal diet. Volume restriction to 1500 mL. Benadryl 25 mg PO Q 8 hours PRN itching.      Intervention Category Major Interventions: Other:  Lysle Dingwall 06/22/2022, 8:28 PM

## 2022-06-22 NOTE — Progress Notes (Signed)
Levophed started a 2 mcg/min. Dr Lynetta Mare and Fuller Song NP notifed of hypotension.

## 2022-06-23 ENCOUNTER — Ambulatory Visit: Payer: Medicare PPO | Admitting: Orthopedic Surgery

## 2022-06-23 DIAGNOSIS — Z992 Dependence on renal dialysis: Secondary | ICD-10-CM | POA: Diagnosis not present

## 2022-06-23 DIAGNOSIS — N186 End stage renal disease: Secondary | ICD-10-CM | POA: Diagnosis not present

## 2022-06-23 DIAGNOSIS — R569 Unspecified convulsions: Secondary | ICD-10-CM | POA: Diagnosis not present

## 2022-06-23 DIAGNOSIS — E08649 Diabetes mellitus due to underlying condition with hypoglycemia without coma: Secondary | ICD-10-CM

## 2022-06-23 DIAGNOSIS — N390 Urinary tract infection, site not specified: Secondary | ICD-10-CM

## 2022-06-23 DIAGNOSIS — G9340 Encephalopathy, unspecified: Secondary | ICD-10-CM | POA: Diagnosis not present

## 2022-06-23 DIAGNOSIS — E1122 Type 2 diabetes mellitus with diabetic chronic kidney disease: Secondary | ICD-10-CM | POA: Diagnosis not present

## 2022-06-23 LAB — GLUCOSE, CAPILLARY
Glucose-Capillary: 147 mg/dL — ABNORMAL HIGH (ref 70–99)
Glucose-Capillary: 122 mg/dL — ABNORMAL HIGH (ref 70–99)
Glucose-Capillary: 165 mg/dL — ABNORMAL HIGH (ref 70–99)
Glucose-Capillary: 273 mg/dL — ABNORMAL HIGH (ref 70–99)

## 2022-06-23 LAB — URINE CULTURE

## 2022-06-23 MED ORDER — HYDROXYZINE HCL 10 MG PO TABS
10.0000 mg | ORAL_TABLET | ORAL | Status: DC | PRN
  Filled 2022-06-23: qty 1

## 2022-06-23 MED ORDER — SODIUM CHLORIDE 0.9 % IV SOLN
1.0000 g | INTRAVENOUS | Status: AC
  Administered 2022-06-23 – 2022-06-25 (×3): 1 g via INTRAVENOUS
  Filled 2022-06-23 (×3): qty 10

## 2022-06-23 MED ORDER — ANTICOAGULANT SODIUM CITRATE 4% (200MG/5ML) IV SOLN: 5.0000 mL | Status: DC | PRN

## 2022-06-23 MED ORDER — CARVEDILOL 3.125 MG PO TABS
3.1250 mg | ORAL_TABLET | Freq: Two times a day (BID) | ORAL | Status: DC
  Administered 2022-06-24 – 2022-06-29 (×9): 3.125 mg via ORAL
  Filled 2022-06-23 (×9): qty 1

## 2022-06-23 MED ORDER — LEVETIRACETAM 100 MG/ML PO SOLN
750.0000 mg | Freq: Every day | ORAL | Status: DC
  Administered 2022-06-23 – 2022-06-29 (×7): 750 mg via ORAL
  Filled 2022-06-23 (×7): qty 10

## 2022-06-23 MED ORDER — HEPARIN SODIUM (PORCINE) 1000 UNIT/ML IJ SOLN
INTRAMUSCULAR | Status: AC
  Filled 2022-06-23: qty 4

## 2022-06-23 MED ORDER — ALTEPLASE 2 MG IJ SOLR: 2.0000 mg | Freq: Once | INTRAMUSCULAR | Status: DC | PRN

## 2022-06-23 MED ORDER — LEVETIRACETAM 250 MG PO TABS
375.0000 mg | ORAL_TABLET | ORAL | Status: DC
  Administered 2022-06-24 – 2022-06-29 (×3): 375 mg via ORAL
  Filled 2022-06-23 (×3): qty 2

## 2022-06-23 NOTE — Procedures (Signed)
HD note  Patient stated that she was feeling sick and wanted to sign the paper to get off of treatment.  She stated she felt nauseated and also hungry. UF had to be stopped related to low BP at 1730

## 2022-06-23 NOTE — Procedures (Addendum)
Patient Name: Carolyn Russo  MRN: [id number]  Epilepsy Attending: [name]  Referring Physician/Provider: Greta Doom, MD  Duration: 06/22/2022 1347 to 06/23/2022 5916   Patient history: 71 year old female with acute unresponsiveness of unclear etiology.  EEG to evaluate for seizure.   Level of alertness: awake, asleep  AEDs during EEG study: LEV   Technical aspects: This EEG study was done with scalp electrodes positioned according to the 10-20 International system of electrode placement. Electrical activity was reviewed with band pass filter of 1-'70Hz'$ , sensitivity of 7 uV/mm, display speed of 82m/sec with a '60Hz'$  notched filter applied as appropriate. EEG data were recorded continuously and digitally stored.  Video monitoring was available and reviewed as appropriate.   Description: The posterior dominant rhythm consists of 8-9 Hz activity of moderate voltage (25-35 uV) seen predominantly in posterior head regions, symmetric and reactive to eye opening and eye closing.  Sleep was characterized by vertex waves, sleep spindles (12 to 14 Hz), maximal central region.  EEG showed intermittent generalized and maximal right temporal parietal region 3 to 5 Hz theta-delta slowing.  Spikes were noted in right temporoparietal region which at times appeared quasiperiodic at 1 to 1.5 Hz, predominantly when awake/stimulated.  Hyperventilation and photic stimulation were not performed.      ABNORMALITY - Spike, right temporoparietal region - Intermittent slow, generalized and maximal right temporoparietal region   IMPRESSION: This study showed evidence of epileptogenicity and cortical dysfunction in right temporoparietal region. Additionally there is mild diffuse encephalopathy, nonspecific etiology.  No seizures  were seen throughout the recording.   Torri Michalski OBarbra Sarks

## 2022-06-23 NOTE — Progress Notes (Signed)
Pt receives out-pt HD at [place] on MWF. Pt arrives at 6:20 for 6:40 chair time. Will assist as needed.   Melven Sartorius Renal Navigator [phone]

## 2022-06-23 NOTE — Progress Notes (Signed)
NAME:  Carolyn Russo, MRN:  [id number], DOB:  Sep 18, 1951, LOS: 1 ADMISSION DATE:  06/22/2022, CONSULTATION DATE:  06/22/22 REFERRING MD:  Sedonia Small- EDP, CHIEF COMPLAINT:  unresponsive   History of Present Illness:  Carolyn Russo is a 71 yo female with CHF, ESRD on HD, seizures, type 2 diabetes, previous CVAs with residual left sided weakness, and hx of R BKA who presents to [facility] after being hypoglycemic and confused at [place] (SNF). History was obtained from the patient's husband, Dr. Marylynn Pearson, at the bedside. He states that he last heard from the patient around 10p yesterday and she was doing well and was at her baseline. She was not complaining of anything nor has she had any recent illnesses. She has been at [facility] for the past week and a half after she had a R BKA done after having gangrene of the right foot. The patient had been recovering well, and was even able to go to the [place] gym yesterday, with assistance. This morning, the nurse was going to wake the patient to get her ready for dialysis, however, she was very "lethargic". Reportedly, her CBG was in the 40s (although nursing notes indicate it was in the 60s) at that time and then she was given glucagon and brought to the ED.   In the ED, the patient was found to be unresponsive. Her CBG was 115. CBC with leukocytosis to 13.3, Hb 11.9, lactic acid 2.0, BUN 44, and electrolytes wnl. With the patient remaining unresponsive, decision was made to intubate her to protect her airway. CTH in ED was negative and CTA showed no large vessel occlusion.   Pertinent  Medical History  ESRD on HD, T2DM, previous CVAs, HFpEF, seizures  Significant Hospital Events: Including procedures, antibiotic start and stop dates in addition to other pertinent events   8/30 unresponsive and intubated in the ED. Extubated in afternoon  Interim History / Subjective:   Mentation improved today   Objective   Blood pressure (!) 127/95, pulse 82,  temperature 98.2 F (36.8 C), temperature source Bladder, resp. rate 20, weight 78 kg, SpO2 94 %.    Vent Mode: PRVC FiO2 (%):  [40 %] 40 % Set Rate:  [18 bmp] 18 bmp Vt Set:  [450 mL] 450 mL PEEP:  [5 cmH20] 5 cmH20 Plateau Pressure:  [17 cmH20] 17 cmH20   Intake/Output Summary (Last 24 hours) at 06/23/2022 1015 Last data filed at 06/23/2022 0800 Gross per 24 hour  Intake 40.81 ml  Output 125 ml  Net -84.19 ml   Filed Weights   06/22/22 0700 06/23/22 0500  Weight: 78 kg 78 kg    Examination: General: WDWN older adult F NAD  HENT: NCAT pink mm  Lungs: CTAb even unlabored  Cardiovascular: rrr s1s2 cap refill < 3 sec  Abdomen: soft ndnt +bowel sound  Extremities: R BKA.  No acute deformity  Neuro: Awake following commands  GU: foley    Resolved Hospital Problem list    Hypothermia  Assessment & Plan:   Acute encephalopathy, improving  Seizure  -no sz on eeg but shows epileptogenicity R temporoparietal region  - unsure that this was severe enough to precipitate sx  MRI brain w/o evidence of PRES  P -  AED per neuro  - follow infectious workup -- looks like probable UTI  HTN/ hypertensive urgency, improved  -restart home coreg   UTI  -start rocephin, 5d course   ESRD on MWF iHD  P -iHD per nephro  DM2 with hypoglycemia  At home, patient is on Lantus 13u daily and SSI. Wonder if patient is on too much insulin and prolonged hypoglycemia is the cause of her acute encephalopathy.  P - adv diet as tolerated  - CBG monitoring -still holding insulin, will reintroduce as appropriate (would plan to start with sSSI)  Pruritis -PRN atarax, PRN benadryl if no relief   Dispo: Stable to transfer out of ICU 8/31   Best Practice (right click and "Reselect all SmartList Selections" daily)   Diet/type: Regular consistency (see orders) DVT prophylaxis: prophylactic heparin  GI prophylaxis: PPI Lines: N/A Foley:  Yes, and it is no longer needed and removal ordered   Code Status:  full code Last date of multidisciplinary goals of care discussion [updated patient's husband at the bedside 8/31]  Labs   CBC: Recent Labs  Lab 06/22/22 0638 06/22/22 0651 06/22/22 0838  WBC 13.3*  --   --   HGB 11.9* 13.6 11.9*  HCT 35.0* 40.0 35.0*  MCV 97.2  --   --   PLT 334  --   --     Basic Metabolic Panel: Recent Labs  Lab 06/22/22 0651 06/22/22 0838 06/22/22 0919  NA 136 134* 135  K 4.9 3.5 3.8  CL 98  --  98  CO2  --   --  24  GLUCOSE 117*  --  139*  BUN 44*  --  30*  CREATININE 7.90*  --  7.35*  CALCIUM  --   --  9.5   GFR: Estimated Creatinine Clearance: 7 mL/min (A) (by C-G formula based on SCr of 7.35 mg/dL (H)). Recent Labs  Lab 06/22/22 0638  WBC 13.3*  LATICACIDVEN 2.0*    Liver Function Tests: Recent Labs  Lab 06/22/22 0919  AST 21  ALT 21  ALKPHOS 68  BILITOT 0.6  PROT 6.3*  ALBUMIN 2.8*   No results for input(s): "LIPASE", "AMYLASE" in the last 168 hours. No results for input(s): "AMMONIA" in the last 168 hours.  ABG    Component Value Date/Time   PHART 7.484 (H) 06/22/2022 0838   PCO2ART 40.9 06/22/2022 0838   PO2ART 502 (H) 06/22/2022 0838   HCO3 30.7 (H) 06/22/2022 0838   TCO2 32 06/22/2022 0838   ACIDBASEDEF 2.0 05/31/2022 1049   O2SAT 100 06/22/2022 0838     Coagulation Profile: Recent Labs  Lab 06/22/22 0638  INR 1.0    Cardiac Enzymes: No results for input(s): "CKTOTAL", "CKMB", "CKMBINDEX", "TROPONINI" in the last 168 hours.  HbA1C: Hemoglobin A1C  Date/Time Value Ref Range Status  12/22/2020 12:00 AM 8.6  Final  06/19/2020 12:00 AM 9.6  Final   Hgb A1c MFr Bld  Date/Time Value Ref Range Status  06/04/2022 01:32 AM 4.6 (L) 4.8 - 5.6 % Final    Comment:    (NOTE) Pre diabetes:          5.7%-6.4%  Diabetes:              >6.4%  Glycemic control for   <7.0% adults with diabetes   06/03/2022 12:15 PM 4.7 (L) 4.8 - 5.6 % Final    Comment:    REPEATED TO VERIFY (NOTE) Pre diabetes:           5.7%-6.4%  Diabetes:              >6.4%  Glycemic control for   <7.0% adults with diabetes     CBG: Recent Labs  Lab 06/22/22 1521 06/22/22 1934  06/22/22 2334 06/23/22 0328 06/23/22 0800  GLUCAP 147* 140* 211* 147* 122*    CCT: n/a    Eliseo Gum MSN, AGACNP-BC [place] for pager  06/23/2022, 10:15 AM

## 2022-06-23 NOTE — Procedures (Signed)
HD Note  Information may have been entered after the data was gathered.  The stated time is accurate.

## 2022-06-23 NOTE — Progress Notes (Signed)
[name] Progress Note   Subjective:   Pt extubated yesterday. She is feeling much better. Alert and answers questions appropriately but requires some redirection. Denies SOB, CP, palpitations, dizziness and nausea. Reports she is itchy.   Objective Vitals:   06/23/22 0531 06/23/22 0600 06/23/22 0625 06/23/22 0800  BP: 123/76  (!) 114/50 (!) 127/95  Pulse: 88 86 83 82  Resp: '19 18 16 20  '$ Temp: 98.6 F (37 C) 98.6 F (37 C) 98.6 F (37 C) 98.2 F (36.8 C)  TempSrc:    Bladder  SpO2: 100% 94% 91% 94%  Weight:       Physical Exam General: Elderly female, alert and in NAD Heart: RRR, no murmurs, rubs or gallops Lungs: CTA bilaterally without wheezing, rhonchi or rales Abdomen: Soft, non-distended, +BS Extremities: No edema b/l lower extremities. R BKA Dialysis Access: [facility] with intact bandage  Additional Objective Labs: Basic Metabolic Panel: Recent Labs  Lab 06/22/22 0651 06/22/22 0838 06/22/22 0919  NA 136 134* 135  K 4.9 3.5 3.8  CL 98  --  98  CO2  --   --  24  GLUCOSE 117*  --  139*  BUN 44*  --  30*  CREATININE 7.90*  --  7.35*  CALCIUM  --   --  9.5   Liver Function Tests: Recent Labs  Lab 06/22/22 0919  AST 21  ALT 21  ALKPHOS 68  BILITOT 0.6  PROT 6.3*  ALBUMIN 2.8*   No results for input(s): "LIPASE", "AMYLASE" in the last 168 hours. CBC: Recent Labs  Lab 06/22/22 0638 06/22/22 0651 06/22/22 0838  WBC 13.3*  --   --   HGB 11.9* 13.6 11.9*  HCT 35.0* 40.0 35.0*  MCV 97.2  --   --   PLT 334  --   --    Blood Culture    Component Value Date/Time   SDES BLOOD RIGHT HAND 06/22/2022 1332   SPECREQUEST  06/22/2022 1332    AEROBIC BOTTLE ONLY Blood Culture results may not be optimal due to an inadequate volume of blood received in culture bottles   CULT  06/22/2022 1332    NO GROWTH < 24 HOURS Performed at [facility], [place] [address] [place], [place] [address]    REPTSTATUS PENDING 06/22/2022 1332    Cardiac  Enzymes: No results for input(s): "CKTOTAL", "CKMB", "CKMBINDEX", "TROPONINI" in the last 168 hours. CBG: Recent Labs  Lab 06/22/22 1521 06/22/22 1934 06/22/22 2334 06/23/22 0328 06/23/22 0800  GLUCAP 147* 140* 211* 147* 122*   Iron Studies: No results for input(s): "IRON", "TIBC", "TRANSFERRIN", "FERRITIN" in the last 72 hours. '@lablastinr3'$ @ Studies/Results: Overnight EEG with video  Result Date: 06/23/2022 Carolyn Havens, MD     06/23/2022  9:11 AM Patient Name: Carolyn Russo MRN: [id number] Epilepsy Attending: Lora Russo Referring Physician/Provider: Greta Doom, MD Duration: 06/22/2022 1347 to 06/23/2022 0900  Patient history: 71 year old female with acute unresponsiveness of unclear etiology.  EEG to evaluate for seizure.  Level of alertness: awake, asleep AEDs during EEG study: LEV  Technical aspects: This EEG study was done with scalp electrodes positioned according to the 10-20 International system of electrode placement. Electrical activity was reviewed with band pass filter of 1-'70Hz'$ , sensitivity of 7 uV/mm, display speed of 55m/sec with a '60Hz'$  notched filter applied as appropriate. EEG data were recorded continuously and digitally stored.  Video monitoring was available and reviewed as appropriate.  Description: The posterior dominant rhythm consists  of 8-9 Hz activity of moderate voltage (25-35 uV) seen predominantly in posterior head regions, symmetric and reactive to eye opening and eye closing.  Sleep was characterized by vertex waves, sleep spindles (12 to 14 Hz), maximal central region.  EEG showed intermittent generalized and maximal right temporal parietal region 3 to 5 Hz theta-delta slowing.  Spikes were noted in right temporoparietal region which at times appeared quasiperiodic at 1 to 1.5 Hz, predominantly when awake/stimulated.  Hyperventilation and photic stimulation were not performed.    ABNORMALITY - Spike, right temporoparietal region - Intermittent  slow, generalized and maximal right temporoparietal region  IMPRESSION: This study showed evidence of epileptogenicity and cortical dysfunction in right temporoparietal region. Additionally there is mild diffuse encephalopathy, nonspecific etiology.  No seizures  were seen throughout the recording.  Carolyn Russo    MR BRAIN WO CONTRAST  Result Date: 06/22/2022 CLINICAL DATA:  Mental status change with unknown cause EXAM: MRI HEAD WITHOUT CONTRAST TECHNIQUE: Multiplanar, multiecho pulse sequences of the brain and surrounding structures were obtained without intravenous contrast. COMPARISON:  Head CT and CTA from yesterday FINDINGS: Brain: Fading of the now subacute infarct in the left corona radiata on diffusion imaging. Even more remote right parietal cortex infarct which is no longer apparent on diffusion imaging. Remote lacunar infarct at the right basal ganglia and corona radiata with wallerian degeneration descending the right brainstem. Confluent white matter gliosis greatest in the frontal lobes. Generalized brain atrophy. No acute infarct, hemorrhage, hydrocephalus, or collection. Vascular: Major flow voids are preserved Skull and upper cervical spine: No focal marrow lesion. Sinuses/Orbits: Bilateral maxillary sinus effusions in the setting of intubation. IMPRESSION: 1. No acute finding. 2. Atrophy and chronic small vessel disease. Electronically Signed   By: Jorje Guild M.D.   On: 06/22/2022 11:35   EEG adult  Result Date: 06/22/2022 Carolyn Havens, MD     06/22/2022 10:33 AM Patient Name: Carolyn Russo MRN: [id number] Epilepsy Attending: Lora Russo Referring Physician/Provider: Greta Doom, MD Date: 06/22/2022 Duration: 26.55 mins Patient history: 71 year old female with acute unresponsiveness of unclear etiology.  EEG to evaluate for seizure. Level of alertness:  lethargic AEDs during EEG study: None Technical aspects: This EEG study was done with scalp electrodes  positioned according to the 10-20 International system of electrode placement. Electrical activity was reviewed with band pass filter of 1-'70Hz'$ , sensitivity of 7 uV/mm, display speed of 13m/sec with a '60Hz'$  notched filter applied as appropriate. EEG data were recorded continuously and digitally stored.  Video monitoring was available and reviewed as appropriate. Description: The posterior dominant rhythm consists of 8-9 Hz activity of moderate voltage (25-35 uV) seen predominantly in posterior head regions, symmetric and reactive to eye opening and eye closing.  EEG showed intermittent generalized and maximal right temporal parietal region 3 to 5 Hz theta-delta slowing.  Frequent spikes were noted in right temporoparietal region.  Hyperventilation and photic stimulation were not performed.   ABNORMALITY - Spike, right temporoparietal region - Intermittent slow, generalized and maximal right temporoparietal region IMPRESSION: This study showed evidence of epileptogenicity and cortical dysfunction in right temporoparietal region.  Additionally there is mild diffuse encephalopathy, nonspecific etiology.  No seizures  were seen throughout the recording. Priyanka OBarbra Sarks  CT ANGIO HEAD NECK W WO CM  Result Date: 06/22/2022 CLINICAL DATA:  Neuro deficit, stroke suspected EXAM: CT ANGIOGRAPHY HEAD AND NECK TECHNIQUE: Multidetector CT imaging of the head and neck was performed using the standard protocol during bolus  administration of intravenous contrast. Multiplanar CT image reconstructions and MIPs were obtained to evaluate the vascular anatomy. Carotid stenosis measurements (when applicable) are obtained utilizing NASCET criteria, using the distal internal carotid diameter as the denominator. RADIATION DOSE REDUCTION: This exam was performed according to the departmental dose-optimization program which includes automated exposure control, adjustment of the mA and/or kV according to patient size and/or use of  iterative reconstruction technique. CONTRAST:  25m OMNIPAQUE IOHEXOL 350 MG/ML SOLN COMPARISON:  Same-day noncontrast CT, MR and MRA head 06/04/2022, CTA head and neck 05/05/2021 FINDINGS: CTA NECK FINDINGS Aortic arch: The imaged aortic arch is normal. The origins of the major branch vessels are patent. The subclavian arteries are patent to the level imaged. Right carotid system: The right common, internal, and external carotid arteries are patent with minimal plaque of the bifurcation but without hemodynamically significant stenosis or occlusion. There is no dissection or aneurysm. Left carotid system: The left common, internal, and external carotid arteries are patent, without hemodynamically significant stenosis or occlusion. There is no dissection or aneurysm. Vertebral arteries: The vertebral arteries are patent, without hemodynamically significant stenosis or occlusion. There is no dissection or aneurysm. Skeleton: There is no acute osseous abnormality or suspicious osseous lesion. There is no visible canal hematoma. Other neck: Endotracheal and enteric tubes are noted, incompletely imaged. Fluid in the imaged airway and layering in the sinuses is likely related to intubation. The soft tissues of the neck are unremarkable. Upper chest: The imaged lung apices are clear. Review of the MIP images confirms the above findings CTA HEAD FINDINGS Anterior circulation: There is calcified plaque in the carotid siphons without hemodynamically significant stenosis or occlusion. The bilateral MCAs are patent with mild atherosclerotic irregularity and narrowing but no proximal high-grade stenosis or occlusion. The bilateral ACAs are patent without proximal stenosis or occlusion. There is no aneurysm or AVM. Posterior circulation: The bilateral V4 segments are patent. The basilar artery is patent. The major cerebellar artery origins are patent The bilateral PCAs are patent. There is short-segment moderate stenosis of the  left P2 segment (8-134, 12-25). Prominent bilateral posterior communicating arteries are patent. There is no aneurysm or AVM. Venous sinuses: Patent. Anatomic variants: None. Review of the MIP images confirms the above findings IMPRESSION: 1. No emergent large vessel occlusion. 2. Short-segment moderate stenosis of the left P2 segment. Otherwise mild intracranial atherosclerosis with no other significant stenosis. 3. Patent vasculature of the neck without hemodynamically significant stenosis, occlusion, or dissection. Electronically Signed   By: PValetta MoleM.D.   On: 06/22/2022 08:52   CT HEAD WO CONTRAST (5MM)  Result Date: 06/22/2022 CLINICAL DATA:  Delirium EXAM: CT HEAD WITHOUT CONTRAST TECHNIQUE: Contiguous axial images were obtained from the base of the skull through the vertex without intravenous contrast. RADIATION DOSE REDUCTION: This exam was performed according to the departmental dose-optimization program which includes automated exposure control, adjustment of the mA and/or kV according to patient size and/or use of iterative reconstruction technique. COMPARISON:  Brain MRI 06/03/2022 FINDINGS: Brain: No evidence of acute infarction, hemorrhage, hydrocephalus, extra-axial collection or mass lesion/mass effect. Chronic small vessel ischemia. White matter gliosis is greatest in the frontal lobes. Remote right basal ganglia infarct with wallerian degeneration seen into the right pons based on MRI. Cerebral volume loss. Vascular: No hyperdense vessel or unexpected calcification. Skull: Normal. Negative for fracture or focal lesion. Sinuses/Orbits: No acute finding. IMPRESSION: No acute or reversible finding.  Stable from recent brain MRI. Electronically Signed   By: JRoderic Palau  Watts M.D.   On: 06/22/2022 07:20   DG Chest Port 1 View  Result Date: 06/22/2022 CLINICAL DATA:  Intubation EXAM: PORTABLE CHEST 1 VIEW COMPARISON:  06/11/2022 FINDINGS: Endotracheal tube with tip 12 mm above the carina. The  enteric tube reaches the stomach. Right Perma catheter with tip at the right atrium. Extensive artifact from EKG leads. Low volume chest with hazy density likely reflecting atelectasis. No visible effusion or pneumothorax. Cardiomegaly. IMPRESSION: 1. Endotracheal tube with tip 12 mm above the carina. 2. The enteric tube reaches the stomach. 3. Cardiomegaly and low lung volumes. Electronically Signed   By: Jorje Guild M.D.   On: 06/22/2022 07:16   Medications:  cefTRIAXone (ROCEPHIN)  IV      aspirin  81 mg Oral QHS   carvedilol  3.125 mg Oral BID WC   Chlorhexidine Gluconate Cloth  6 each Topical Q0600   heparin injection (subcutaneous)  5,000 Units Subcutaneous Q8H   levETIRAcetam  750 mg Oral Daily   [START ON 06/24/2022] levETIRAcetam  375 mg Oral Q M,W,F-1800   ticagrelor  90 mg Oral BID    Outpatient Dialysis Orders: MWF - [facility] 4 hrs 180NRE 350/700 79 kg 2.0 K/2.0 Ca UFP 4 No heparin - Mircera 200 mcg  IV q 2wks - last 04/29/22 as outpt, unclear if she got last admission - Hectorol 74mg IV TIW    Assessment/Plan: 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 DSharol Given  Preferred narcotic agents for pain control are hydromorphone, fentanyl, and methadone. Morphine should not be used. Avoid Baclofen 2. Acute Metabolic Encephalopathy/Confusion:  CT head negative. Intubated for airway protection but was able to be extubated yesterday. Neurology suspected seizure, adjusted keppra and recommended avoiding hypotension.  3. ESRD: Continue HD on usual MWF schedule. Did not dialyze yesterday due to severity of illness. Planned for HD today, should be able to come up to the unit 4. Anemia of ESRD: continue weekly Aranesp (2014m q Fri). Was on IV iron, stopped 8/5 because of ongoing infection and then restarted. 5. Secondary hyperparathyroidism : CorrCa high, VDRA on hold. Phos at goal. Continue Renvela as binder when she is having GI intake. 6. HTN/volume: BP controlled.  UF with  HD as tolerated. 7. Nutrition, protein calorie malnutrition: continue protein supplements.  8. T2DM: Per primary.  9. Hx CVA: On dual ASA/brilinta, and Keppra.   SaAnice PaganiniPA-C 06/23/2022, 10:12 AM  [facility] Pager: [phone]

## 2022-06-23 NOTE — Progress Notes (Signed)
Subjective: Patient is much improved, was extubated.  Exam: Vitals:   06/23/22 0625 06/23/22 0800  BP: (!) 114/50 (!) 127/95  Pulse: 83 82  Resp: 16 20  Temp: 98.6 F (37 C) 98.2 F (36.8 C)  SpO2: 91% 94%   Gen: In bed, NAD Resp: non-labored breathing, no acute distress Abd: soft, nt  Neuro: MS: Awake, alert, interactive and appropriate CN: EOMI, VF F Motor: She has a left hemiparesis which is chronic, she is able to move her right leg some Sensory: Reports symmetric sensation to light touch  Pertinent Labs: UA?  UTI  Impression: 71 year old female with episode of unresponsiveness of unclear etiology.  Unclear how low her sugar was at the nursing home yesterday, but even if in the 40s, I would not expect it to account for the severity of the encephalopathy seen on arrival.  Maybe it was lower previously before it was measured, but my suspicion is that this represents unwitnessed seizure given the amount of irritability on EEG.  No further seizures on the EEG, and we can discontinue this.  I would favor increasing her Keppra slightly.  I will also change it to a.m. dosing with after dialysis additional dose.  Recommendations: 1) Keppra 750 mg daily with 375 mg after dialysis 2) maintenance of euglycemia 3) avoid hypotension 4) no further recommendations at this time, please call if further questions or concerns.  Roland Rack, MD [facility] [phone]  If 7pm- 7am, please page neurology on call as listed in [place].

## 2022-06-23 NOTE — Telephone Encounter (Signed)
This pt was a BKA on 05/18/2022 who was sch for her first post op appt today. She is currently in [id number] pt's husband wanted to make you aware that she was admitted if you wanted to see her while there and give instructions for limb.

## 2022-06-23 NOTE — Progress Notes (Signed)
EEG D/C'd. No initial skin breakdown. [facility] notified.

## 2022-06-23 NOTE — Evaluation (Signed)
Physical Therapy Evaluation Patient Details Name: Carolyn Russo MRN: [id number] DOB: Feb 27, 1951 Today's Date: 06/23/2022  History of Present Illness  Pt is a 71 y.o. female who presented 06/22/22 with AMS and found to be unresponsive in the ED. SBP was ~220 upon arrival. MRI of brain revealed no acute findings. ETT 8/30-8/30. EEG showed evidence of epileptogenicity and cortical dysfunction in right temporoparietal region along with mild diffuse encephalopathy. Work-up pending. 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 PTA, she was recently at a SNF. She reports she was requiring extensive assistance for all mobility and ADLs, including using a hoyer lift for transfers to/from the w/c. She reports she was working on standing with therapy there. Currently, pt demonstrates deficits in cognition (no family present to confirm baseline), bil strength (L weaker than R, due to prior CVA per pt), balance, and activity tolerance. She is requiring mod-maxA for bed mobility and maxA to transfer to stand at this time. I suspect pt is likely not far from her recent baseline. Recommend pt return to SNF for rehab. Will continue to follow acutely.       Recommendations for follow up therapy are one component of a multi-disciplinary discharge planning process, led by the attending physician.  Recommendations may be updated based on patient status, additional functional criteria and insurance authorization.  Follow Up Recommendations Skilled nursing-short term rehab (<3 hours/day) Can patient physically be transported by private vehicle: No    Assistance Recommended at Discharge Frequent or constant Supervision/Assistance  Patient can return home with the following  Two people to help with walking and/or transfers;Two people to  help with bathing/dressing/bathroom;Assistance with feeding;Direct supervision/assist for medications management;Direct supervision/assist for financial management;Assist for transportation    Equipment Recommendations Other (comment) (defer to next venue of care)  Recommendations for Other Services  OT consult    Functional Status Assessment Patient has had a recent decline in their functional status and demonstrates the ability to make significant improvements in function in a reasonable and predictable amount of time.     Precautions / Restrictions Precautions Precautions: Fall Precaution Comments: EEG; R BKA with limb protector Required Braces or Orthoses: Other Brace Other Brace: limb protector R BKA Restrictions Weight Bearing Restrictions: Yes RLE Weight Bearing: Non weight bearing      Mobility  Bed Mobility Overal bed mobility: Needs Assistance Bed Mobility: Supine to Sit, Sit to Supine, Rolling Rolling: Mod assist   Supine to sit: Max assist, HOB elevated Sit to supine: Max assist, HOB elevated   General bed mobility comments: Pt stating "ok pull me up", demonstrating poor initiation to pull herself to sit, thereby needing maxA for supine <> sit transitions and modA to roll.    Transfers Overall transfer level: Needs assistance Equipment used: 1 person hand held assist Transfers: Sit to/from Stand Sit to Stand: Max assist           General transfer comment: MaxA with support under pt's buttocks and L knee blocked to stand 1x from EOB, cuing pt to extend knee and pull on therapist with her UEs.    Ambulation/Gait               General Gait Details: deferred  Science writer  Modified Rankin (Stroke Patients Only) Modified Rankin (Stroke Patients Only) Pre-Morbid Rankin Score: Severe disability Modified Rankin: Severe disability     Balance Overall balance assessment: Needs assistance Sitting-balance support: No  upper extremity supported, Bilateral upper extremity supported, Feet supported Sitting balance-Leahy Scale: Fair Sitting balance - Comments: Able to reach bil UEs up in air without LOB, min guard for safety.   Standing balance support: Bilateral upper extremity supported Standing balance-Leahy Scale: Zero Standing balance comment: MaxA and bil UE support with L knee block to stand briefly                             Pertinent Vitals/Pain Pain Assessment Pain Assessment: Faces Faces Pain Scale: Hurts little more Pain Location: buttocks Pain Descriptors / Indicators: Discomfort, Sore Pain Intervention(s): Limited activity within patient's tolerance, Monitored during session, Repositioned, Other (comment) (obtained mepilex pads for RN for when they finish bathing her soon)    Home Living Family/patient expects to be discharged to:: Skilled nursing facility Living Arrangements: Spouse/significant other                 Additional Comments: Husband works as Chief Executive Officer. He assists patient when at home. Has aide come in daily to assist with housekeeping tasks and ADL tasks.    Prior Function Prior Level of Function : Needs assist       Physical Assist : Mobility (physical);ADLs (physical) Mobility (physical): Transfers;Bed mobility;Gait ADLs (physical): Grooming;Bathing;Dressing;Toileting;IADLs Mobility Comments: Pt reports she has needed extensive physical assistance for bed mobility and transfers using a hoyer lift at the SNF. She reports she was standing "some" with therapy there. Per chart and entry 05/19/22: Patient utilized w/c and self propelled using BLEs. RW was used for short distances. ADLs Comments: Pt reports she was needing assistance for bathing/dressing at the SNF     Hand Dominance        Extremity/Trunk Assessment   Upper Extremity Assessment Upper Extremity Assessment: Defer to OT evaluation (holds L hand in flexed position; weaker on L  compared to R)    Lower Extremity Assessment Lower Extremity Assessment: RLE deficits/detail;LLE deficits/detail RLE Deficits / Details: prior R BKA-staples present LLE Deficits / Details: L leg weaker than R, pt reports is from a prior CVA (MD also present and assessed/noted this); pt denied sensation deficits/asymetry    Cervical / Trunk Assessment Cervical / Trunk Assessment: Kyphotic  Communication   Communication: HOH  Cognition Arousal/Alertness: Awake/alert Behavior During Therapy: Flat affect Overall Cognitive Status: No family/caregiver present to determine baseline cognitive functioning                                 General Comments: Pt HOH and thus needing info repeated often. However, pt also appears to have poor insight into her deficits or situation. She is able to recall events leading up to hospitalization though. Poor recall of her phone passcode. Slow processing noted. Unsure if this is her baseline as no family was present to confirm        General Comments General comments (skin integrity, edema, etc.): VSS on RA    Exercises     Assessment/Plan    PT Assessment Patient needs continued PT services  PT Problem List Decreased strength;Decreased activity tolerance;Decreased balance;Decreased mobility;Decreased cognition;Decreased knowledge of use of DME;Decreased safety awareness;Obesity;Decreased coordination       PT Treatment Interventions Therapeutic exercise;Balance training;Therapeutic  activities;Functional mobility training;DME instruction;Wheelchair mobility training;Neuromuscular re-education;Cognitive remediation;Patient/family education    PT Goals (Current goals can be found in the Care Plan section)  Acute Rehab PT Goals Patient Stated Goal: to call her husband (PT assisted her) PT Goal Formulation: With patient Time For Goal Achievement: 07/07/22 Potential to Achieve Goals: Fair    Frequency Min 2X/week     Co-evaluation                AM-PAC PT "6 Clicks" Mobility  Outcome Measure Help needed turning from your back to your side while in a flat bed without using bedrails?: A Lot Help needed moving from lying on your back to sitting on the side of a flat bed without using bedrails?: A Lot Help needed moving to and from a bed to a chair (including a wheelchair)?: Total Help needed standing up from a chair using your arms (e.g., wheelchair or bedside chair)?: A Lot Help needed to walk in hospital room?: Total Help needed climbing 3-5 steps with a railing? : Total 6 Click Score: 9    End of Session   Activity Tolerance: Patient tolerated treatment well Patient left: in bed;with call bell/phone within reach;with bed alarm set Nurse Communication: Mobility status;Other (comment) (buttocks is sore) PT Visit Diagnosis: Muscle weakness (generalized) (M62.81);Other abnormalities of gait and mobility (R26.89);Unsteadiness on feet (R26.81)    Time: 8875-7972 PT Time Calculation (min) (ACUTE ONLY): 26 min   Charges:   PT Evaluation $PT Eval Moderate Complexity: 1 Mod PT Treatments $Therapeutic Activity: 8-22 mins        Moishe Spice, PT, DPT Acute Rehabilitation Services  Office: [phone]   Orvan Falconer 06/23/2022, 9:41 AM

## 2022-06-23 NOTE — Progress Notes (Signed)
Pt's cellular phone delivered to [phone].  RN aware.

## 2022-06-24 ENCOUNTER — Encounter: Payer: Medicare PPO | Admitting: Physical Medicine and Rehabilitation

## 2022-06-24 DIAGNOSIS — G9340 Encephalopathy, unspecified: Secondary | ICD-10-CM | POA: Diagnosis not present

## 2022-06-24 LAB — CBC
HCT: 30.2 % — ABNORMAL LOW (ref 36.0–46.0)
Hemoglobin: 10.2 g/dL — ABNORMAL LOW (ref 12.0–15.0)
MCH: 32 pg (ref 26.0–34.0)
MCHC: 33.8 g/dL (ref 30.0–36.0)
MCV: 94.7 fL (ref 80.0–100.0)
Platelets: 337 10*3/uL (ref 150–400)
RBC: 3.19 MIL/uL — ABNORMAL LOW (ref 3.87–5.11)
RDW: 17.9 % — ABNORMAL HIGH (ref 11.5–15.5)
WBC: 12.9 10*3/uL — ABNORMAL HIGH (ref 4.0–10.5)
nRBC: 0 % (ref 0.0–0.2)

## 2022-06-24 LAB — HEPATITIS B SURFACE ANTIBODY, QUANTITATIVE: Hep B S AB Quant (Post): 8.7 m[IU]/mL — ABNORMAL LOW (ref >9.9)

## 2022-06-24 LAB — GLUCOSE, CAPILLARY: Glucose-Capillary: 206 mg/dL — ABNORMAL HIGH (ref 70–99)

## 2022-06-24 MED ORDER — SEVELAMER CARBONATE 0.8 G PO PACK
0.8000 g | PACK | Freq: Three times a day (TID) | ORAL | Status: DC
  Administered 2022-06-24 – 2022-06-29 (×14): 0.8 g via ORAL
  Filled 2022-06-24 (×18): qty 1

## 2022-06-24 MED ORDER — MIDODRINE HCL 5 MG PO TABS: 10.0000 mg | ORAL_TABLET | ORAL | Status: DC

## 2022-06-24 MED ORDER — DICLOFENAC SODIUM 1 % EX GEL
2.0000 g | Freq: Four times a day (QID) | CUTANEOUS | Status: DC
  Administered 2022-06-24 – 2022-06-29 (×17): 2 g via TOPICAL
  Filled 2022-06-24: qty 100

## 2022-06-24 MED ORDER — HYDROXYZINE HCL 10 MG PO TABS
10.0000 mg | ORAL_TABLET | ORAL | Status: DC | PRN
Start: 2022-06-24 — End: 2022-06-30
  Administered 2022-06-25 – 2022-06-29 (×5): 10 mg via ORAL
  Filled 2022-06-24 (×8): qty 1

## 2022-06-24 MED ORDER — MIDODRINE HCL 5 MG PO TABS
10.0000 mg | ORAL_TABLET | ORAL | Status: DC
  Administered 2022-06-27 – 2022-06-29 (×3): 10 mg via ORAL
  Filled 2022-06-24 (×3): qty 2

## 2022-06-24 MED ORDER — MIDODRINE HCL 5 MG PO TABS
10.0000 mg | ORAL_TABLET | Freq: Once | ORAL | Status: AC
Start: 2022-06-25 — End: 2022-06-25
  Administered 2022-06-25: 10 mg via ORAL
  Filled 2022-06-24: qty 2

## 2022-06-24 NOTE — Progress Notes (Signed)
Physical Therapy Treatment Patient Details Name: Carolyn Russo MRN: [id number] DOB: [birth year] Today's Date: 06/24/2022   History of Present Illness Pt is a 71 y.o. female who presented 06/22/22 with AMS and found to be unresponsive in the ED. SBP was ~220 upon arrival. MRI of brain revealed no acute findings. ETT 8/30-8/30. EEG showed evidence of epileptogenicity and cortical dysfunction in right temporoparietal region along with mild diffuse encephalopathy. Work-up pending. 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 Agreed to some therapy as she wants to eat. Skilled session focused on right LE strengthening/mobility this session with no issues noted or reported.  Acute PT to continued during pt's hospital stay.   Recommendations for follow up therapy are one component of a multi-disciplinary discharge planning process, led by the attending physician.  Recommendations may be updated based on patient status, additional functional criteria and insurance authorization.  Follow Up Recommendations  Skilled nursing-short term rehab (<3 hours/day) Can patient physically be transported by private vehicle: No   Assistance Recommended at Discharge Frequent or constant Supervision/Assistance  Patient can return home with the following Two people to help with walking and/or transfers;Two people to help with bathing/dressing/bathroom;Assistance with feeding;Direct supervision/assist for medications management;Direct supervision/assist for financial management;Assist for transportation   Equipment Recommendations  Other (comment) (defer to next venue of care)    Recommendations for Other Services OT consult     Precautions / Restrictions Precautions Precautions: Fall Precaution Comments: R BKA with limb protector Required  Braces or Orthoses: Other Brace Other Brace: limb protector R BKA Restrictions Weight Bearing Restrictions: Yes RLE Weight Bearing: Non weight bearing Other Position/Activity Restrictions: recommend elevation of RLE     Cognition Arousal/Alertness: Awake/alert Behavior During Therapy: Flat affect Overall Cognitive Status: History of cognitive impairments - at baseline Area of Impairment: Following commands, Problem solving, Awareness, Safety/judgement                 Orientation Level: Person, Place, Situation Current Attention Level: Selective Memory: Decreased short-term memory Following Commands: Follows one step commands consistently, Follows one step commands with increased time Safety/Judgement: Decreased awareness of safety   Problem Solving: Slow processing, Difficulty sequencing General Comments: Pt HOH and thus needing info repeated often. However, pt also appears to have poor insight into her deficits or situation. She is able to recall events leading up to hospitalization though. Slow processing noted. Pt familiar to this PTA from outpatient rehab in April of this year. No significant change in cognition noted, spouse (Dr. [name]) present today and confirmed this. Pt has history of nodding off/decreased responsiveness with therapies.        Exercises General Exercises - Lower Extremity Quad Sets: AROM, AAROM, Strengthening, Right, 10 reps, Supine, Limitations Quad Sets Limitations: 5 second holds Heel Slides: AAROM, Strengthening, 10 reps, Supine, Right Hip ABduction/ADduction: AAROM, Strengthening, 10 reps, Supine, Right Straight Leg Raises: AAROM, Both, 10 reps, Supine    General Comments General comments (skin integrity, edema, etc.): VSS      Pertinent Vitals/Pain Pain Assessment Pain Assessment: No/denies pain Faces Pain Scale: No hurt    Home Living Family/patient expects to be discharged to:: Skilled nursing facility Living Arrangements:  Spouse/significant other  Additional Comments: Husband works as Chief Executive Officer. He assists patient when at home. Has aide come in daily to assist with housekeeping tasks and ADL tasks.     PT Goals (current goals can now be found in the care plan section) Acute Rehab PT Goals Patient Stated Goal: to get stronger PT Goal Formulation: With patient Time For Goal Achievement: 07/07/22 Potential to Achieve Goals: Fair Progress towards PT goals: Progressing toward goals    Frequency    Min 2X/week      PT Plan Current plan remains appropriate    AM-PAC PT "6 Clicks" Mobility   Outcome Measure  Help needed turning from your back to your side while in a flat bed without using bedrails?: A Lot Help needed moving from lying on your back to sitting on the side of a flat bed without using bedrails?: A Lot Help needed moving to and from a bed to a chair (including a wheelchair)?: Total Help needed standing up from a chair using your arms (e.g., wheelchair or bedside chair)?: A Lot Help needed to walk in hospital room?: Total Help needed climbing 3-5 steps with a railing? : Total 6 Click Score: 9    End of Session   Activity Tolerance: Patient tolerated treatment well Patient left: in chair;with call bell/phone within reach;with chair alarm set;with family/visitor present Nurse Communication: Mobility status PT Visit Diagnosis: Muscle weakness (generalized) (M62.81);Other abnormalities of gait and mobility (R26.89);Unsteadiness on feet (R26.81)     Time: 1430-1440 PT Time Calculation (min) (ACUTE ONLY): 10 min  Charges:  $Therapeutic Exercise: 8-22 mins                    Willow Ora, PTA, [facility] Acute Rehab Services Office- [phone] 06/24/22, 3:02 PM    Willow Ora 06/24/2022, 2:59 PM

## 2022-06-24 NOTE — Progress Notes (Addendum)
Carolyn Russo  MHD:[id number] DOB: 04-Aug-1951 DOA: 06/22/2022 PCP: [name], MD    Brief Narrative:  71 year old with a history of ESRD on HD, CHF, seizure disorder, DM2, CVA with residual left-sided weakness, and right BKA who presented to the [facility] ER after she was found to be hypoglycemic and confused at her SNF.  She was discovered lethargic in her bed after previously being at her baseline.  CBG was noted to be in the 40s.  On arrival in the ED her CBG had improved to 115 but she remained unresponsive.  She was subsequently intubated for airway protection and admitted to the ICU.  CT head at time of admission was negative and CTa of the head and neck revealed no large vessel occlusions.  Significant Events:  8/30 unresponsive in ED - intubated  -admitted to ICU  8/30 extubated in ICU 3:30PM 83/1 EEG -noted evidence of epileptogenicity and cortical dysfunction right temporoparietal region with mild diffuse encephalopathy but no active seizure  Consultants:  PCCM Neurology Nephrology  Goals of Care:  Code Status: Full Code   DVT prophylaxis: Subcu heparin  Interim Hx: Afebrile.  Vital signs stable.  No acute events reported overnight.  CBG stable/elevated.  Resting comfortably at the time of my visit.  Assessment & Plan:  Toxic metabolic encephalopathy due to hypoglycemia or seizure activity Neurology concerned about the likelihood of seizure activity - Keppra has been initiated - no evidence of PRES on MRI -monitor mental status  Acute hypoxic respiratory failure - ruled out To clarify, this patient did not experience true hypoxic resp failure - she was intubated due to a lack of protecting her own airway when she was obtunded - she was not hypoxic   DM2 w/ Hypoglycemia CBG in 40s at SNF at time of unresponsiveness - currently off insulin - on lantus as outpt -monitor CBG trend  HTN Blood pressure trending upward -adjust medical therapy and follow trend  ?UTI  POA Emperic rocephin being dosed -urine culture notes multiple species -unclear of the significance of this in a patient with ESRD and inconsistent urinary output -discontinue antibiotics after short course  ESRD on MWF HD Nephrology following   Family Communication: No family present at time of exam Disposition: SNF placement pending   Objective: Blood pressure 133/78, pulse 74, temperature 97.9 F (36.6 C), resp. rate 18, weight 80.9 kg, SpO2 97 %.  Intake/Output Summary (Last 24 hours) at 06/24/2022 0840 Last data filed at 06/23/2022 1850 Gross per 24 hour  Intake 325.35 ml  Output 1200 ml  Net -874.65 ml   Filed Weights   06/22/22 0700 06/23/22 0500 06/24/22 0500  Weight: 78 kg 78 kg 80.9 kg    Examination: General: No acute respiratory distress Lungs: Clear to auscultation bilaterally without wheezes or crackles Cardiovascular: Regular rate and rhythm without murmur  Abdomen: Nontender, nondistended, soft, bowel sounds positive, no rebound, no ascites, no appreciable mass Extremities: No significant cyanosis, clubbing, or edema bilateral lower extremities  CBC: Recent Labs  Lab 06/22/22 0638 06/22/22 0651 06/22/22 0838 06/24/22 0604  WBC 13.3*  --   --  12.9*  HGB 11.9* 13.6 11.9* 10.2*  HCT 35.0* 40.0 35.0* 30.2*  MCV 97.2  --   --  94.7  PLT 334  --   --  211   Basic Metabolic Panel: Recent Labs  Lab 06/22/22 0651 06/22/22 0838 06/22/22 0919  NA 136 134* 135  K 4.9 3.5 3.8  CL 98  --  98  CO2  --   --  24  GLUCOSE 117*  --  139*  BUN 44*  --  30*  CREATININE 7.90*  --  7.35*  CALCIUM  --   --  9.5   GFR: Estimated Creatinine Clearance: 7.2 mL/min (A) (by C-G formula based on SCr of 7.35 mg/dL (H)).   HbA1C: Hemoglobin A1C  Date/Time Value Ref Range Status  12/22/2020 12:00 AM 8.6  Final  06/19/2020 12:00 AM 9.6  Final   Hgb A1c MFr Bld  Date/Time Value Ref Range Status  06/04/2022 01:32 AM 4.6 (L) 4.8 - 5.6 % Final    Comment:     (NOTE) Pre diabetes:          5.7%-6.4%  Diabetes:              >6.4%  Glycemic control for   <7.0% adults with diabetes   06/03/2022 12:15 PM 4.7 (L) 4.8 - 5.6 % Final    Comment:    REPEATED TO VERIFY (NOTE) Pre diabetes:          5.7%-6.4%  Diabetes:              >6.4%  Glycemic control for   <7.0% adults with diabetes     Scheduled Meds:  aspirin  81 mg Oral QHS   carvedilol  3.125 mg Oral BID WC   Chlorhexidine Gluconate Cloth  6 each Topical Q0600   heparin injection (subcutaneous)  5,000 Units Subcutaneous Q8H   levETIRAcetam  750 mg Oral Daily   levETIRAcetam  375 mg Oral Q M,W,F-1800   [START ON 06/27/2022] midodrine  10 mg Oral Q M,W,F-HD   [START ON 06/25/2022] midodrine  10 mg Oral Once in dialysis   ticagrelor  90 mg Oral BID   Continuous Infusions:  cefTRIAXone (ROCEPHIN)  IV 1 g (06/24/22 0836)     LOS: 2 days   Cherene Altes, MD [facility] Office  [phone] Pager - Text Page per Shea Evans  If 7PM-7AM, please contact night-coverage per Amion 06/24/2022, 8:40 AM

## 2022-06-24 NOTE — NC FL2 (Signed)
[place] MEDICAID FL2 LEVEL OF CARE SCREENING TOOL     IDENTIFICATION  Patient Name: Carolyn Russo Birthdate: November 07, 1950 Sex: female Admission Date (Current Location): 06/22/2022  [facility] and [place] Number:  Herbalist and Address:  [facility]. [facility], [place] [address], [place], [place] [address]      Provider Number: [id number]  Attending Physician Name and Address:  Cherene Altes, MD  Relative Name and Phone Number:       Current Level of Care: Hospital Recommended Level of Care: [place] Prior Approval Number:    Date Approved/Denied:   PASRR Number:    Discharge Plan: SNF    Current Diagnoses: Patient Active Problem List   Diagnosis Date Noted   Urinary tract infection without hematuria    Diabetes mellitus due to underlying condition with hypoglycemia without coma, with long-term current use of insulin ([place])    Acute encephalopathy 06/22/2022   Acute respiratory failure ([place])    Unresponsiveness    Pressure ulcer 06/07/2022   Acute stroke due to ischemia ([place]) 06/03/2022   Chest pain 06/01/2022   History of CVA (cerebrovascular accident) 06/01/2022   Seizure ([place])    Encephalopathy    Below-knee amputation of right lower extremity ([place])    Leukocytosis    Foot infection 05/10/2022   PAD (peripheral artery disease) ([place]) 05/10/2022   Gangrene of right foot ([place]) 04/15/2022   Left hemiparesis ([place]) 09/24/2021   Abnormality of gait 09/24/2021   Pain of right hand 08/06/2021   Steal syndrome of dialysis vascular access ([place]) 08/06/2021   Subclavian steal syndrome of right subclavian artery 06/25/2021   Right hand weakness 06/25/2021   Stroke-like symptoms 05/06/2021   Bandemia    Cerebrovascular accident (CVA) of right basal ganglia ([place]) 04/20/2021   External hemorrhoids    Basal ganglia infarction ([place]) 04/16/2021   ESRD on dialysis ([place]) 04/16/2021   Hypertensive urgency 03/10/2021   Hilar enlargement     Anemia of chronic disease    Chronic diastolic CHF (congestive heart failure) ([place]) 03/09/2021   CKD (chronic kidney disease), stage V ([place]) 03/08/2021   Diabetic retinopathy ([place]) 03/08/2021   Hyperglycemia due to type 2 diabetes mellitus ([place]) 03/08/2021   Pure hypercholesterolemia 03/08/2021   Anemia in chronic kidney disease 09/24/2020   Uncontrolled type 2 diabetes mellitus with hyperglycemia ([place]) 03/19/2019   Osteopenia 09/04/2018   Migraine 09/04/2018   Asthma 09/04/2018   Pseudophakia of both eyes 07/17/2018   Pseudophakia of left eye 07/03/2018   Open angle with borderline findings and high glaucoma risk in left eye 07/03/2018   Age-related nuclear cataract of right eye 07/03/2018   CAD (coronary artery disease), native coronary artery 04/27/2017   Abnormal nuclear stress test 04/04/2017   Shortness of breath 03/09/2017   Appendicitis    Age-related hypermature cataract of both eyes 12/20/2016   Uterine leiomyoma 11/27/2012   Dyslipidemia (high LDL; low HDL) 11/25/2011   Obesity (BMI 30-39.9) 11/25/2011   Hypertensive disorder 11/21/2011   Type 2 diabetes mellitus ([place]) 11/21/2011   Abnormal EKG 11/21/2011    Orientation RESPIRATION BLADDER Height & Weight     Self, Place  Normal Incontinent Weight: 178 lb 5.6 oz (80.9 kg) Height:     BEHAVIORAL SYMPTOMS/MOOD NEUROLOGICAL BOWEL NUTRITION STATUS      Incontinent Diet (see DC summary)  AMBULATORY STATUS COMMUNICATION OF NEEDS Skin   Extensive Assist Verbally Surgical wounds, Other (Comment) (closed surgical wound, right knee, compression wrap;  pressure injury, mid sacrum, foam dressing: lift every shift to assess, change PRN)                       Personal Care Assistance Level of Assistance  Bathing, Feeding, Dressing Bathing Assistance: Maximum assistance Feeding assistance: Limited assistance Dressing Assistance: Maximum assistance     Functional Limitations Info             SPECIAL CARE FACTORS  FREQUENCY  PT (By licensed PT), OT (By licensed OT)     PT Frequency: 5x/wk OT Frequency: 5x/wk            Contractures Contractures Info: Not present    Additional Factors Info  Code Status, Allergies Code Status Info: Full Allergies Info: Food, Wheat Bran, Statins, Gadolinium Derivatives, Januvia (Sitagliptin), Pork-derived Products, Shellfish Allergy, Tetracycline           Current Medications (06/24/2022):  This is the current hospital active medication list Current Facility-Administered Medications  Medication Dose Route Frequency Provider Last Rate Last Admin   aspirin chewable tablet 81 mg  81 mg Oral QHS Maudie Flakes, MD   81 mg at 06/23/22 2032   carvedilol (COREG) tablet 3.125 mg  3.125 mg Oral BID WC Cristal Generous, NP   3.125 mg at 06/24/22 1607   cefTRIAXone (ROCEPHIN) 1 g in sodium chloride 0.9 % 100 mL IVPB  1 g Intravenous Q24H Joette Catching T, MD 200 mL/hr at 06/24/22 0836 1 g at 06/24/22 0836   Chlorhexidine Gluconate Cloth 2 % PADS 6 each  6 each Topical Q0600 Dwana Melena, MD   6 each at 06/24/22 0832   docusate (COLACE) 50 MG/5ML liquid 100 mg  100 mg Oral BID PRN Maudie Flakes, MD       heparin injection 5,000 Units  5,000 Units Subcutaneous Q8H [place], Rayann N, DO   5,000 Units at 06/24/22 1607   hydrOXYzine (ATARAX) tablet 10 mg  10 mg Oral Q4H PRN Cherene Altes, MD       levETIRAcetam (KEPPRA) 100 MG/ML solution 750 mg  750 mg Oral Daily Greta Doom, MD   750 mg at 06/24/22 0828   levETIRAcetam (KEPPRA) tablet 375 mg  375 mg Oral Q M,W,F-1800 Greta Doom, MD       [START ON 06/27/2022] midodrine (PROAMATINE) tablet 10 mg  10 mg Oral Q M,W,F-HD Dwana Melena, MD       [START ON 06/25/2022] midodrine (PROAMATINE) tablet 10 mg  10 mg Oral Once in dialysis Dwana Melena, MD       Oral care mouth rinse  15 mL Mouth Rinse PRN Jacky Kindle, MD       polyethylene glycol (MIRALAX / GLYCOLAX) packet 17 g  17 g Oral Daily PRN Maudie Flakes, MD       sevelamer carbonate (RENVELA) packet 0.8 g  0.8 g Oral TID with meals Joette Catching T, MD   0.8 g at 06/24/22 1607   ticagrelor (BRILINTA) tablet 90 mg  90 mg Oral BID Maudie Flakes, MD   90 mg at 06/24/22 6222     Discharge Medications: Please see discharge summary for a list of discharge medications.  Relevant Imaging Results:  Relevant Lab Results:   Additional Information SS#: [id number]  Geralynn Ochs, LCSW

## 2022-06-24 NOTE — Progress Notes (Addendum)
[facility] Progress Note   Outpatient Dialysis Orders: MWF - [place] 4 hrs 180NRE 350/700 79 kg 2.0 K/2.0 Ca UFP 4 No heparin - Mircera 200 mcg  IV q 2wks - last 04/29/22 as outpt, unclear if she got last admission - Hectorol 61mg IV TIW    Assessment/Plan: 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 DSharol Given  Preferred narcotic agents for pain control are hydromorphone, fentanyl, and methadone. Morphine should not be used. Avoid Baclofen 2. Acute Metabolic Encephalopathy/Confusion:  CT head negative. Intubated for airway protection but currently extubated. Neurology suspected seizure, adjusted keppra and recommended avoiding hypotension.  3. ESRD: Continue HD on usual MWF schedule. HD on 8/31 UF 1.1L but had to come off (hypotensive as well) and only had 2h336m. -Plan on HD tomorrow and then next week back to MWF to get back on schedule. 50042mF max tomorrow as she was hypotensive on Thur.  4. Anemia of ESRD: continue weekly Aranesp (200m28m Fri). Was on IV iron, stopped 8/5 because of ongoing infection and then restarted. 5. Secondary hyperparathyroidism : CorrCa high, VDRA on hold. Phos at goal. Continue Renvela as binder when she is having GI intake. 6. HTN/volume: BP controlled.  UF with HD as tolerated. 7. Nutrition, continue protein supplements if available o/w consult dietician.  8. T2DM: Per primary.  9. Hx CVA: On dual ASA/brilinta, and Keppra.   Subjective:   Pt extubated 8/30 and she is feeling much better; hypotensive yest and came off hd after 2hr32mi48mlert and answers questions appropriately with spouse bedside.  Denies SOB, CP, palpitations, dizziness and nausea.   Objective Vitals:   06/23/22 1828 06/23/22 2029 06/24/22 0344 06/24/22 0500  BP: (!) 131/56 (!) 123/98 (!) 157/63   Pulse: 82 79 78   Resp: (!) '22 18 14   '$ Temp: 98.3 F (36.8 C) 98.5 F (36.9 C) 97.7 F (36.5 C)   TempSrc: Oral Oral Oral   SpO2: 100%  95%   Weight:     80.9 kg   Physical Exam General: Elderly female, alert and in NAD Heart: RRR, no murmurs, rubs or gallops Lungs: CTA bilaterally without wheezing, rhonchi or rales Abdomen: Soft, non-distended, +BS Extremities: No edema b/l lower extremities. R BKA [facility]ialysis [facility]  Additional Objective Labs: Basic Metabolic Panel: Recent Labs  Lab 06/22/22 0651 06/22/22 0838 06/22/22 0919  NA 136 134* 135  K 4.9 3.5 3.8  CL 98  --  98  CO2  --   --  24  GLUCOSE 117*  --  139*  BUN 44*  --  30*  CREATININE 7.90*  --  7.35*  CALCIUM  --   --  9.5   Liver Function Tests: Recent Labs  Lab 06/22/22 0919  AST 21  ALT 21  ALKPHOS 68  BILITOT 0.6  PROT 6.3*  ALBUMIN 2.8*   No results for input(s): "LIPASE", "AMYLASE" in the last 168 hours. CBC: Recent Labs  Lab 06/22/22 0638 06/22/22 0651 06/22/22 0838 06/24/22 0604  WBC 13.3*  --   --  12.9*  HGB 11.9* 13.6 11.9* 10.2*  HCT 35.0* 40.0 35.0* 30.2*  MCV 97.2  --   --  94.7  PLT 334  --   --  337   Blood Culture    Component Value Date/Time   SDES BLOOD RIGHT HAND 06/22/2022 1332   SPECREQUEST  06/22/2022 1332    AEROBIC BOTTLE ONLY Blood Culture results may not be  optimal due to an inadequate volume of blood received in culture bottles   CULT  06/22/2022 1332    NO GROWTH 2 DAYS Performed at [facility], [place] [address] [place], [place] [address]    REPTSTATUS PENDING 06/22/2022 1332    Cardiac Enzymes: No results for input(s): "CKTOTAL", "CKMB", "CKMBINDEX", "TROPONINI" in the last 168 hours. CBG: Recent Labs  Lab 06/23/22 0328 06/23/22 0800 06/23/22 1140 06/23/22 2138 06/24/22 0546  GLUCAP 147* 122* 165* 273* 206*   Iron Studies: No results for input(s): "IRON", "TIBC", "TRANSFERRIN", "FERRITIN" in the last 72 hours. '@lablastinr3'$ @ Studies/Results: Overnight EEG with video  Result Date: 06/23/2022 [name], MD     06/23/2022 12:50 PM Patient Name: JOANANN MIES MRN:  [id number] Epilepsy Attending: [name] Referring Physician/Provider: Greta Doom, MD Duration: 06/22/2022 1347 to 06/23/2022 2130  Patient history: 71 year old female with acute unresponsiveness of unclear etiology.  EEG to evaluate for seizure.  Level of alertness: awake, asleep AEDs during EEG study: LEV  Technical aspects: This EEG study was done with scalp electrodes positioned according to the 10-20 International system of electrode placement. Electrical activity was reviewed with band pass filter of 1-'70Hz'$ , sensitivity of 7 uV/mm, display speed of 37m/sec with a '60Hz'$  notched filter applied as appropriate. EEG data were recorded continuously and digitally stored.  Video monitoring was available and reviewed as appropriate.  Description: The posterior dominant rhythm consists of 8-9 Hz activity of moderate voltage (25-35 uV) seen predominantly in posterior head regions, symmetric and reactive to eye opening and eye closing.  Sleep was characterized by vertex waves, sleep spindles (12 to 14 Hz), maximal central region.  EEG showed intermittent generalized and maximal right temporal parietal region 3 to 5 Hz theta-delta slowing.  Spikes were noted in right temporoparietal region which at times appeared quasiperiodic at 1 to 1.5 Hz, predominantly when awake/stimulated.  Hyperventilation and photic stimulation were not performed.    ABNORMALITY - Spike, right temporoparietal region - Intermittent slow, generalized and maximal right temporoparietal region  IMPRESSION: This study showed evidence of epileptogenicity and cortical dysfunction in right temporoparietal region. Additionally there is mild diffuse encephalopathy, nonspecific etiology.  No seizures  were seen throughout the recording.  PLora Havens   MR BRAIN WO CONTRAST  Result Date: 06/22/2022 CLINICAL DATA:  Mental status change with unknown cause EXAM: MRI HEAD WITHOUT CONTRAST TECHNIQUE: Multiplanar, multiecho pulse sequences of  the brain and surrounding structures were obtained without intravenous contrast. COMPARISON:  Head CT and CTA from yesterday FINDINGS: Brain: Fading of the now subacute infarct in the left corona radiata on diffusion imaging. Even more remote right parietal cortex infarct which is no longer apparent on diffusion imaging. Remote lacunar infarct at the right basal ganglia and corona radiata with wallerian degeneration descending the right brainstem. Confluent white matter gliosis greatest in the frontal lobes. Generalized brain atrophy. No acute infarct, hemorrhage, hydrocephalus, or collection. Vascular: Major flow voids are preserved Skull and upper cervical spine: No focal marrow lesion. Sinuses/Orbits: Bilateral maxillary sinus effusions in the setting of intubation. IMPRESSION: 1. No acute finding. 2. Atrophy and chronic small vessel disease. Electronically Signed   By: JJorje GuildM.D.   On: 06/22/2022 11:35   EEG adult  Result Date: 06/22/2022 YLora Havens MD     06/22/2022 10:33 AM Patient Name: VSHANTA HARTNERMRN: [id number] Attending: PLora HavensReferring Physician/Provider: KGreta Doom MD Date: 06/22/2022 Duration: 26.55 mins Patient history:  71 year old female with acute unresponsiveness of unclear etiology.  EEG to evaluate for seizure. Level of alertness:  lethargic AEDs during EEG study: None Technical aspects: This EEG study was done with scalp electrodes positioned according to the 10-20 International system of electrode placement. Electrical activity was reviewed with band pass filter of 1-'70Hz'$ , sensitivity of 7 uV/mm, display speed of 67m/sec with a '60Hz'$  notched filter applied as appropriate. EEG data were recorded continuously and digitally stored.  Video monitoring was available and reviewed as appropriate. Description: The posterior dominant rhythm consists of 8-9 Hz activity of moderate voltage (25-35 uV) seen predominantly in posterior head regions,  symmetric and reactive to eye opening and eye closing.  EEG showed intermittent generalized and maximal right temporal parietal region 3 to 5 Hz theta-delta slowing.  Frequent spikes were noted in right temporoparietal region.  Hyperventilation and photic stimulation were not performed.   ABNORMALITY - Spike, right temporoparietal region - Intermittent slow, generalized and maximal right temporoparietal region IMPRESSION: This study showed evidence of epileptogenicity and cortical dysfunction in right temporoparietal region.  Additionally there is mild diffuse encephalopathy, nonspecific etiology.  No seizures  were seen throughout the recording. PLora Havens  CT ANGIO HEAD NECK W WO CM  Result Date: 06/22/2022 CLINICAL DATA:  Neuro deficit, stroke suspected EXAM: CT ANGIOGRAPHY HEAD AND NECK TECHNIQUE: Multidetector CT imaging of the head and neck was performed using the standard protocol during bolus administration of intravenous contrast. Multiplanar CT image reconstructions and MIPs were obtained to evaluate the vascular anatomy. Carotid stenosis measurements (when applicable) are obtained utilizing NASCET criteria, using the distal internal carotid diameter as the denominator. RADIATION DOSE REDUCTION: This exam was performed according to the departmental dose-optimization program which includes automated exposure control, adjustment of the mA and/or kV according to patient size and/or use of iterative reconstruction technique. CONTRAST:  733mOMNIPAQUE IOHEXOL 350 MG/ML SOLN COMPARISON:  Same-day noncontrast CT, MR and MRA head 06/04/2022, CTA head and neck 05/05/2021 FINDINGS: CTA NECK FINDINGS Aortic arch: The imaged aortic arch is normal. The origins of the major branch vessels are patent. The subclavian arteries are patent to the level imaged. Right carotid system: The right common, internal, and external carotid arteries are patent with minimal plaque of the bifurcation but without  hemodynamically significant stenosis or occlusion. There is no dissection or aneurysm. Left carotid system: The left common, internal, and external carotid arteries are patent, without hemodynamically significant stenosis or occlusion. There is no dissection or aneurysm. Vertebral arteries: The vertebral arteries are patent, without hemodynamically significant stenosis or occlusion. There is no dissection or aneurysm. Skeleton: There is no acute osseous abnormality or suspicious osseous lesion. There is no visible canal hematoma. Other neck: Endotracheal and enteric tubes are noted, incompletely imaged. Fluid in the imaged airway and layering in the sinuses is likely related to intubation. The soft tissues of the neck are unremarkable. Upper chest: The imaged lung apices are clear. Review of the MIP images confirms the above findings CTA HEAD FINDINGS Anterior circulation: There is calcified plaque in the carotid siphons without hemodynamically significant stenosis or occlusion. The bilateral MCAs are patent with mild atherosclerotic irregularity and narrowing but no proximal high-grade stenosis or occlusion. The bilateral ACAs are patent without proximal stenosis or occlusion. There is no aneurysm or AVM. Posterior circulation: The bilateral V4 segments are patent. The basilar artery is patent. The major cerebellar artery origins are patent The bilateral PCAs are patent. There is short-segment moderate stenosis of the left  P2 segment (8-134, 12-25). Prominent bilateral posterior communicating arteries are patent. There is no aneurysm or AVM. Venous sinuses: Patent. Anatomic variants: None. Review of the MIP images confirms the above findings IMPRESSION: 1. No emergent large vessel occlusion. 2. Short-segment moderate stenosis of the left P2 segment. Otherwise mild intracranial atherosclerosis with no other significant stenosis. 3. Patent vasculature of the neck without hemodynamically significant stenosis,  occlusion, or dissection. Electronically Signed   By: Valetta Mole M.D.   On: 06/22/2022 08:52   Medications:  cefTRIAXone (ROCEPHIN)  IV 200 mL/hr at 06/23/22 1100    aspirin  81 mg Oral QHS   carvedilol  3.125 mg Oral BID WC   Chlorhexidine Gluconate Cloth  6 each Topical Q0600   heparin injection (subcutaneous)  5,000 Units Subcutaneous Q8H   levETIRAcetam  750 mg Oral Daily   levETIRAcetam  375 mg Oral Q M,W,F-1800   ticagrelor  90 mg Oral BID

## 2022-06-24 NOTE — TOC Initial Note (Signed)
Transition of Care [facility]) - Initial/Assessment Note    Patient Details  Name: Carolyn Russo MRN: [id number] Date of Birth: Apr 29, 1951  [facility] CM/SW Contact:    Geralynn Ochs, LCSW Phone Number: 06/24/2022, 4:18 PM  Clinical Narrative:          CSW met with patient and spouse at bedside to confirm plan to return to [facility] when stable. CSW to send information and follow.         Expected Discharge Plan: [place] Barriers to Discharge: Continued Medical Work up, Ship broker   Patient Goals and CMS Choice Patient states their goals for this hospitalization and ongoing recovery are:: [facility].gov Compare Post Acute Care list provided to:: Patient Choice offered to / list presented to : Patient  Expected Discharge Plan and Services Expected Discharge Plan: [place] Choice: [place] Living arrangements for the past 2 months: Single Family Home, [place]                                      Prior Living Arrangements/Services Living arrangements for the past 2 months: [place], [place] Lives with:: Spouse Patient language and need for interpreter reviewed:: No Do you feel safe going back to the place where you live?: Yes      Need for Family Participation in Patient Care: No (Comment) Care giver support system in place?: Yes (comment)   Criminal Activity/Legal Involvement Pertinent to Current Situation/Hospitalization: No - Comment as needed  Activities of Daily Living      Permission Sought/Granted Permission sought to share information with : Facility Sport and exercise psychologist, Family Supports Permission granted to share information with : Yes, Verbal Permission Granted  Share Information with NAME: [name]  Permission granted to share info w AGENCY: [facility]  Permission granted to share info w  Relationship: Spouse     Emotional Assessment Appearance:: Appears stated age Attitude/Demeanor/Rapport: Engaged Affect (typically observed): Appropriate Orientation: : Oriented to Self, Oriented to Place, Oriented to  Time, Oriented to Situation Alcohol / Substance Use: Not Applicable Psych Involvement: No (comment)  Admission diagnosis:  Acute encephalopathy [G93.40] Unresponsiveness [R41.89] Acute respiratory failure, unspecified whether with hypoxia or hypercapnia (HCC) [J96.00] Altered mental status, unspecified altered mental status type [R41.82] Patient Active Problem List   Diagnosis Date Noted   Urinary tract infection without hematuria    Diabetes mellitus due to underlying condition with hypoglycemia without coma, with long-term current use of insulin ([place])    Acute encephalopathy 06/22/2022   Acute respiratory failure ([place])    Unresponsiveness    Pressure ulcer 06/07/2022   Acute stroke due to ischemia ([place]) 06/03/2022   Chest pain 06/01/2022   History of CVA (cerebrovascular accident) 06/01/2022   Seizure ([place])    Encephalopathy    Below-knee amputation of right lower extremity ([place])    Leukocytosis    Foot infection 05/10/2022   PAD (peripheral artery disease) ([place]) 05/10/2022   Gangrene of right foot ([place]) 04/15/2022   Left hemiparesis ([place]) 09/24/2021   Abnormality of gait 09/24/2021   Pain of right hand 08/06/2021   Steal syndrome of dialysis vascular access ([place]) 08/06/2021   Subclavian steal syndrome of right subclavian artery 06/25/2021   Right hand weakness 06/25/2021   Stroke-like symptoms 05/06/2021   Bandemia  Cerebrovascular accident (CVA) of right basal ganglia ([place]) 04/20/2021   External hemorrhoids    Basal ganglia infarction ([place]) 04/16/2021   ESRD on dialysis ([place]) 04/16/2021   Hypertensive urgency 03/10/2021   Hilar enlargement    Anemia of chronic disease    Chronic diastolic CHF (congestive heart failure) ([place]) 03/09/2021   CKD  (chronic kidney disease), stage V ([place]) 03/08/2021   Diabetic retinopathy ([place]) 03/08/2021   Hyperglycemia due to type 2 diabetes mellitus ([place]) 03/08/2021   Pure hypercholesterolemia 03/08/2021   Anemia in chronic kidney disease 09/24/2020   Uncontrolled type 2 diabetes mellitus with hyperglycemia ([name]) 03/19/2019   Osteopenia 09/04/2018   Migraine 09/04/2018   Asthma 09/04/2018   Pseudophakia of both eyes 07/17/2018   Pseudophakia of left eye 07/03/2018   Open angle with borderline findings and high glaucoma risk in left eye 07/03/2018   Age-related nuclear cataract of right eye 07/03/2018   CAD (coronary artery disease), native coronary artery 04/27/2017   Abnormal nuclear stress test 04/04/2017   Shortness of breath 03/09/2017   Appendicitis    Age-related hypermature cataract of both eyes 12/20/2016   Uterine leiomyoma 11/27/2012   Dyslipidemia (high LDL; low HDL) 11/25/2011   Obesity (BMI 30-39.9) 11/25/2011   Hypertensive disorder 11/21/2011   Type 2 diabetes mellitus ([place]) 11/21/2011   Abnormal EKG 11/21/2011   PCP:  [name], MD Pharmacy:   [facility] [place], [address] AT [address] [name] [address] Phone: [phone] Fax: [phone]  [facility] (New Address) - [place], [place] [address] AT Previously: [address] [place] [address] Phone: [phone] Fax: [phone]     Social Determinants of Health (SDOH) Interventions    Readmission Risk Interventions     No data to display

## 2022-06-24 NOTE — Consult Note (Signed)
   [facility] CM Inpatient Consult   06/24/2022  Carolyn Russo 1951/01/14 [id number]   [place] [facility] [ACO] Patient: Carolyn Russo PPO   Primary Care Provider: [name], MD, with [facility], with a Care Management team and program and is listed for the [facility] follow up needs    Patient screened for less than 30 days readmission hospitalization with noted extreme high risk score for unplanned readmission risk.  Reviewed to assess for potential [name] Management service needs for post hospital transition for readmission prevention needs.    Met with the patient at the bedside, patient up in recliner. Explained reason for visit for post hospital follow up needs.  Patient appears tired, alert and oriented and states, "well, I'm still here by the grace of God."   Gave patient a reminder appointment card and a 24 hour nurse advise line magnet, placed in her tray as requested.  Noted HD with noted had to come off due to low BP issues. PT eval is recommending a SNF rehab currently.   Plan:  Continue to follow progress and disposition to assess for post hospital care management needs.  Referral request can be made for post hospital follow up for [facility] Care Coordination needs, if appropriate.    For questions contact:    Natividad Brood, RN BSN [facility] Liaison  [phone] business mobile phone Toll free office [phone]  Fax number: [phone] [email or web address]

## 2022-06-24 NOTE — Care Management Important Message (Signed)
Important Message  Patient Details  Name: Carolyn Russo MRN: [id number] Date of Birth: 23-Feb-1951   Medicare Important Message Given:  Yes     Orbie Pyo 06/24/2022, 3:01 PM

## 2022-06-24 NOTE — Evaluation (Signed)
Occupational Therapy Evaluation Patient Details Name: Carolyn Russo MRN: [id number] DOB: 1951-09-16 Today's Date: 06/24/2022   History of Present Illness Pt is a 71 y.o. female who presented 06/22/22 with AMS and found to be unresponsive in the ED. SBP was ~220 upon arrival. MRI of brain revealed no acute findings. ETT 8/30-8/30. EEG showed evidence of epileptogenicity and cortical dysfunction in right temporoparietal region along with mild diffuse encephalopathy. Work-up pending. 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 Pt was recently at [facility] and was requiring assistance for transfers, bathing, and dressing as well. Pt presenting with decreased problem solving, ability to follow commands, safety, balance, strength, and activity tolerance. Pt performing UB ADL with min guard and LB ADT with max-total A. Pt unable to achieve full standing this session despite max A +2 and use of RW due to decreased safety awareness, and decreased strength in LLE. Pt rising, but unable to straighten L knee. Pt performing lateral scoot from bed<>chair with mod A +2. Max multimodal cues for sequencing and hand placement. Recommend discharge to SNF for continued OT services to optimize safety and independence in ADL and IADL.      Recommendations for follow up therapy are one component of a multi-disciplinary discharge planning process, led by the attending physician.  Recommendations may be updated based on patient status, additional functional criteria and insurance authorization.   Follow Up Recommendations  Skilled nursing-short term rehab (<3 hours/day)    Assistance Recommended at Discharge Frequent or constant Supervision/Assistance  Patient can return home with the following Two people to help with bathing/dressing/bathroom;Two people to help with  walking and/or transfers;Assistance with feeding;Assistance with cooking/housework;Assist for transportation;Help with stairs or ramp for entrance;Direct supervision/assist for financial management;Direct supervision/assist for medications management    Functional Status Assessment  Patient has had a recent decline in their functional status and demonstrates the ability to make significant improvements in function in a reasonable and predictable amount of time.  Equipment Recommendations  Other (comment) (defer)    Recommendations for Other Services       Precautions / Restrictions Precautions Precautions: Fall Precaution Comments: EEG; R BKA with limb protector Required Braces or Orthoses: Other Brace Other Brace: limb protector R BKA Restrictions Weight Bearing Restrictions: Yes RLE Weight Bearing: Non weight bearing      Mobility Bed Mobility Overal bed mobility: Needs Assistance Bed Mobility: Supine to Sit     Supine to sit: Mod assist, HOB elevated     General bed mobility comments: Mod A to come to EOB. Pt requiring step by step cues for LE placement. Assist to elevate trunk.    Transfers Overall transfer level: Needs assistance Equipment used: Rolling walker (2 wheels) Transfers: Sit to/from Stand Sit to Stand: Max assist, +2 physical assistance, +2 safety/equipment          Lateral/Scoot Transfers: Mod assist, +2 physical assistance, +2 safety/equipment General transfer comment: Max A +2 for sit to stand, pt with max difficulty coordinating movement and decreased safety awareness, attempting to pull up on RW, and thus scooting bottom forward off of bed rather than elevating. Pt requiring Mod A+2 to scoot toward L to transfer to chair.      Balance Overall balance assessment: Needs assistance Sitting-balance support: No upper extremity supported, Bilateral upper extremity supported, Feet supported Sitting balance-Leahy  Scale: Fair Sitting balance - Comments:  Unable to reach L foot   Standing balance support: Bilateral upper extremity supported Standing balance-Leahy Scale: Zero Standing balance comment: MaxA and bil UE support with L knee block to stand briefly                           ADL either performed or assessed with clinical judgement   ADL Overall ADL's : Needs assistance/impaired Eating/Feeding: Set up;Sitting Eating/Feeding Details (indicate cue type and reason): eating pineapple with fork on departure Grooming: Set up;Sitting   Upper Body Bathing: Min guard;Sitting   Lower Body Bathing: Moderate assistance;Sitting/lateral leans   Upper Body Dressing : Min guard;Sitting   Lower Body Dressing: Total assistance;Sitting/lateral leans Lower Body Dressing Details (indicate cue type and reason): Total A to don L sock sitting EOB Toilet Transfer: Moderate assistance;Maximal assistance;+2 for physical assistance;+2 for safety/equipment (lateral scoot) Toilet Transfer Details (indicate cue type and reason): simulated to recliner         Functional mobility during ADLs: Moderate assistance;Maximal assistance;+2 for physical assistance;+2 for safety/equipment (Max A to stand with RW. Pt requirig cues for safe placement of hands. Mod A+ 2 for lateral scoot) General ADL Comments: Pt limited by problem solving, safety, balance, and generalized weakness     Vision Baseline Vision/History: 1 Wears glasses Patient Visual Report: No change from baseline Vision Assessment?: No apparent visual deficits Additional Comments: Pt able to locate food items on tray. Difficulty with phone use, unsure if due to vision, not being tech savvy, or cognition.     Perception     Praxis      Pertinent Vitals/Pain Pain Assessment Pain Assessment: Faces Faces Pain Scale: Hurts a little bit Pain Location: RLE Pain Descriptors / Indicators: Discomfort, Sore Pain Intervention(s): Limited activity within patient's tolerance, Monitored during  session, Repositioned     Hand Dominance     Extremity/Trunk Assessment Upper Extremity Assessment Upper Extremity Assessment: Generalized weakness;RUE deficits/detail;LUE deficits/detail RUE Deficits / Details: hx of deficits from HD cath placement with coordination deficits at baseline. 4-/5 grip strength RUE Coordination: decreased fine motor;decreased gross motor LUE Deficits / Details: history of CVA affecting this UE, grip >R with 4/5   Lower Extremity Assessment Lower Extremity Assessment: RLE deficits/detail;LLE deficits/detail RLE Deficits / Details: prior R BKA-staples present LLE Deficits / Details: L leg weaker than R, pt reports is from a prior CVA (MD also present and assessed/noted this); pt denied sensation deficits/asymetry   Cervical / Trunk Assessment Cervical / Trunk Assessment: Kyphotic   Communication Communication Communication: HOH   Cognition Arousal/Alertness: Awake/alert Behavior During Therapy: Flat affect Overall Cognitive Status: No family/caregiver present to determine baseline cognitive functioning Area of Impairment: Following commands, Problem solving, Awareness, Safety/judgement                       Following Commands: Follows one step commands consistently, Follows one step commands with increased time Safety/Judgement: Decreased awareness of safety Awareness: Anticipatory Problem Solving: Slow processing, Difficulty sequencing General Comments: Pt HOH and thus needing info repeated often. However, pt also appears to have poor insight into her deficits or situation. She is able to recall events leading up to hospitalization though. Slow processing noted. Unsure if this is her baseline as no family was present to confirm     General Comments  VSS    Exercises     Shoulder Instructions      Home  Living Family/patient expects to be discharged to:: Skilled nursing facility Living Arrangements: Spouse/significant other                                Additional Comments: Husband works as Chief Executive Officer. He assists patient when at home. Has aide come in daily to assist with housekeeping tasks and ADL tasks.      Prior Functioning/Environment Prior Level of Function : Needs assist       Physical Assist : Mobility (physical);ADLs (physical) Mobility (physical): Transfers;Bed mobility;Gait ADLs (physical): Grooming;Bathing;Dressing;Toileting;IADLs Mobility Comments: Pt reports she has needed extensive physical assistance for bed mobility and transfers using a hoyer lift at the SNF. She reports she was standing "some" with therapy there. Per chart and entry 05/19/22: Patient utilized w/c and self propelled using BLEs. RW was used for short distances. ADLs Comments: Pt reports she was needing assistance for bathing/dressing at the SNF        OT Problem List: Decreased strength;Decreased coordination;Decreased cognition;Decreased range of motion;Decreased activity tolerance;Decreased safety awareness;Decreased knowledge of use of DME or AE;Impaired balance (sitting and/or standing)      OT Treatment/Interventions: Self-care/ADL training;Therapeutic exercise;Therapeutic activities;Neuromuscular education;Cognitive remediation/compensation;DME and/or AE instruction;Patient/family education;Balance training;Energy conservation    OT Goals(Current goals can be found in the care plan section) Acute Rehab OT Goals Patient Stated Goal: Get to chair OT Goal Formulation: Patient unable to participate in goal setting Time For Goal Achievement: 07/08/22 Potential to Achieve Goals: Fair  OT Frequency: Min 2X/week    Co-evaluation              AM-PAC OT "6 Clicks" Daily Activity     Outcome Measure Help from another person eating meals?: A Little Help from another person taking care of personal grooming?: A Little Help from another person toileting, which includes using toliet, bedpan, or urinal?: A Lot Help  from another person bathing (including washing, rinsing, drying)?: A Lot Help from another person to put on and taking off regular upper body clothing?: A Little Help from another person to put on and taking off regular lower body clothing?: A Lot 6 Click Score: 15   End of Session Equipment Utilized During Treatment: Gait belt;Rolling walker (2 wheels) Nurse Communication: Mobility status;Other (comment) (transfer type)  Activity Tolerance: Patient tolerated treatment well Patient left: in chair;with call bell/phone within reach;with chair alarm set;with nursing/sitter in room  OT Visit Diagnosis: Unsteadiness on feet (R26.81);Muscle weakness (generalized) (M62.81);Other abnormalities of gait and mobility (R26.89);Other symptoms and signs involving cognitive function                Time: 1124-1150 OT Time Calculation (min): 26 min Charges:  OT General Charges $OT Visit: 1 Visit OT Evaluation $OT Eval Moderate Complexity: 1 Mod OT Treatments $Self Care/Home Management : 8-22 mins  Shanda Howells, OTR/L [facility] Acute Rehabilitation Office: [phone]   Lula Olszewski 06/24/2022, 1:21 PM

## 2022-06-25 DIAGNOSIS — G9340 Encephalopathy, unspecified: Secondary | ICD-10-CM | POA: Diagnosis not present

## 2022-06-25 LAB — RENAL FUNCTION PANEL
Albumin: 2.7 g/dL — ABNORMAL LOW (ref 3.5–5.0)
Anion gap: 10 (ref 5–15)
BUN: 38 mg/dL — ABNORMAL HIGH (ref 8–23)
CO2: 27 mmol/L (ref 22–32)
Calcium: 8.7 mg/dL — ABNORMAL LOW (ref 8.9–10.3)
Chloride: 98 mmol/L (ref 98–111)
Creatinine, Ser: 7.34 mg/dL — ABNORMAL HIGH (ref 0.44–1.00)
GFR, Estimated: 5 mL/min — ABNORMAL LOW (ref >60)
Glucose, Bld: 243 mg/dL — ABNORMAL HIGH (ref 70–99)
Phosphorus: 6.1 mg/dL — ABNORMAL HIGH (ref 2.5–4.6)
Potassium: 3.9 mmol/L (ref 3.5–5.1)
Sodium: 135 mmol/L (ref 135–145)

## 2022-06-25 LAB — CBC
HCT: 30.2 % — ABNORMAL LOW (ref 36.0–46.0)
Hemoglobin: 10.2 g/dL — ABNORMAL LOW (ref 12.0–15.0)
MCH: 31.7 pg (ref 26.0–34.0)
MCHC: 33.8 g/dL (ref 30.0–36.0)
MCV: 93.8 fL (ref 80.0–100.0)
Platelets: 322 10*3/uL (ref 150–400)
RBC: 3.22 MIL/uL — ABNORMAL LOW (ref 3.87–5.11)
RDW: 17.5 % — ABNORMAL HIGH (ref 11.5–15.5)
WBC: 10.9 10*3/uL — ABNORMAL HIGH (ref 4.0–10.5)
nRBC: 0.2 % (ref 0.0–0.2)

## 2022-06-25 LAB — GLUCOSE, CAPILLARY
Glucose-Capillary: 240 mg/dL — ABNORMAL HIGH (ref 70–99)
Glucose-Capillary: 160 mg/dL — ABNORMAL HIGH (ref 70–99)
Glucose-Capillary: 337 mg/dL — ABNORMAL HIGH (ref 70–99)

## 2022-06-25 MED ORDER — ALBUMIN HUMAN 25 % IV SOLN
INTRAVENOUS | Status: AC
  Administered 2022-06-25: 50 mL
  Administered 2022-06-25: 12.5 g
  Filled 2022-06-25: qty 100

## 2022-06-25 MED ORDER — INSULIN ASPART 100 UNIT/ML IJ SOLN
0.0000 [IU] | Freq: Three times a day (TID) | INTRAMUSCULAR | Status: DC
  Administered 2022-06-25: 4 [IU] via SUBCUTANEOUS
  Administered 2022-06-26: 1 [IU] via SUBCUTANEOUS
  Administered 2022-06-26: 4 [IU] via SUBCUTANEOUS
  Administered 2022-06-26: 1 [IU] via SUBCUTANEOUS
  Administered 2022-06-27 (×2): 3 [IU] via SUBCUTANEOUS
  Administered 2022-06-28: 2 [IU] via SUBCUTANEOUS
  Administered 2022-06-28: 3 [IU] via SUBCUTANEOUS
  Administered 2022-06-29: 1 [IU] via SUBCUTANEOUS
  Administered 2022-06-29: 2 [IU] via SUBCUTANEOUS

## 2022-06-25 MED ORDER — HEPARIN SODIUM (PORCINE) 1000 UNIT/ML IJ SOLN
INTRAMUSCULAR | Status: AC
  Administered 2022-06-25: 1000 [IU] via INTRAVENOUS_CENTRAL
  Filled 2022-06-25: qty 4

## 2022-06-25 NOTE — Progress Notes (Signed)
Carolyn Russo  JGG:[id number] DOB: Jun 04, 1951 DOA: 06/22/2022 PCP: [name], MD    Brief Narrative:  71 year old with a history of ESRD on HD, CHF, seizure disorder, DM2, CVA with residual left-sided weakness, and right BKA who presented to the [facility] ER after she was found to be hypoglycemic and confused at her SNF.  She was discovered lethargic in her bed after previously being at her baseline.  CBG was noted to be in the 40s.  On arrival in the ED her CBG had improved to 115 but she remained unresponsive.  She was subsequently intubated for airway protection and admitted to the ICU.  CT head at time of admission was negative and CTa of the head and neck revealed no large vessel occlusions.  Significant Events:  8/30 unresponsive in ED - intubated - admitted to ICU  8/30 extubated in ICU 3:30PM 83/1 EEG - noted evidence of epileptogenicity and cortical dysfunction right temporoparietal region with mild diffuse encephalopathy but no active seizure  Consultants:  PCCM Neurology Nephrology  Goals of Care:  Code Status: Full Code   DVT prophylaxis: Subcu heparin  Interim Hx: Afebrile.  Vital signs stable.  CBG consistently elevated >200.  Somnolent but appears to be resting comfortably in bed.  Will awaken to voice and stimulation.  Denies complaints.  I had an extensive discussion with the patient's husband at the bedside  Assessment & Plan:  Toxic metabolic encephalopathy due to hypoglycemia or seizure activity Neurology concerned about the likelihood of seizure activity - Keppra has been initiated - no evidence of PRES on MRI -no observed seizure disorder thus far during the hospital stay  Acute hypoxic respiratory failure - ruled out To clarify, this patient did not experience true hypoxic resp failure - she was intubated because she was not protecting her own airway when she was obtunded - she was not hypoxic   DM2 w/ Hypoglycemia CBG in 40s at SNF at time of  unresponsiveness - currently off insulin - on lantus as outpt -continue to monitor CBG trend  HTN Monitor without change today -patient requires midodrine with dialysis treatment therefore will avoid overcorrection between dialysis treatments  ?UTI POA Emperic rocephin being dosed -urine culture notes multiple species -unclear of the significance of this in a patient with ESRD and inconsistent urinary output -discontinue antibiotics after third dose today  ESRD on MWF HD Nephrology following   Family Communication: Spoke with husband, who is a physician, at the bedside Disposition: SNF placement pending (return to [facility])   Objective: Blood pressure (!) 144/55, pulse 77, temperature 98.7 F (37.1 C), temperature source Oral, resp. rate 13, weight 80.9 kg, SpO2 100 %.  Intake/Output Summary (Last 24 hours) at 06/25/2022 0841 Last data filed at 06/25/2022 0730 Gross per 24 hour  Intake 237 ml  Output --  Net 237 ml    Filed Weights   06/22/22 0700 06/23/22 0500 06/24/22 0500  Weight: 78 kg 78 kg 80.9 kg    Examination: General: No acute respiratory distress Lungs: Clear to auscultation bilaterally  Cardiovascular: Regular rate and rhythm without murmur  Abdomen: Nontender, nondistended, soft, bowel sounds positive Extremities: No significant edema bilateral lower extremities  CBC: Recent Labs  Lab 06/22/22 0638 06/22/22 0651 06/22/22 0838 06/24/22 0604 06/25/22 0751  WBC 13.3*  --   --  12.9* 10.9*  HGB 11.9*   < > 11.9* 10.2* 10.2*  HCT 35.0*   < > 35.0* 30.2* 30.2*  MCV 97.2  --   --  94.7 93.8  PLT 334  --   --  337 322   < > = values in this interval not displayed.    Basic Metabolic Panel: Recent Labs  Lab 06/22/22 0651 06/22/22 0838 06/22/22 0919  NA 136 134* 135  K 4.9 3.5 3.8  CL 98  --  98  CO2  --   --  24  GLUCOSE 117*  --  139*  BUN 44*  --  30*  CREATININE 7.90*  --  7.35*  CALCIUM  --   --  9.5    GFR: Estimated Creatinine Clearance:  7.2 mL/min (A) (by C-G formula based on SCr of 7.35 mg/dL (H)).   HbA1C: Hemoglobin A1C  Date/Time Value Ref Range Status  12/22/2020 12:00 AM 8.6  Final  06/19/2020 12:00 AM 9.6  Final   Hgb A1c MFr Bld  Date/Time Value Ref Range Status  06/04/2022 01:32 AM 4.6 (L) 4.8 - 5.6 % Final    Comment:    (NOTE) Pre diabetes:          5.7%-6.4%  Diabetes:              >6.4%  Glycemic control for   <7.0% adults with diabetes   06/03/2022 12:15 PM 4.7 (L) 4.8 - 5.6 % Final    Comment:    REPEATED TO VERIFY (NOTE) Pre diabetes:          5.7%-6.4%  Diabetes:              >6.4%  Glycemic control for   <7.0% adults with diabetes     Scheduled Meds:  aspirin  81 mg Oral QHS   carvedilol  3.125 mg Oral BID WC   Chlorhexidine Gluconate Cloth  6 each Topical Q0600   diclofenac Sodium  2 g Topical QID   heparin injection (subcutaneous)  5,000 Units Subcutaneous Q8H   levETIRAcetam  750 mg Oral Daily   levETIRAcetam  375 mg Oral Q M,W,F-1800   [START ON 06/27/2022] midodrine  10 mg Oral Q M,W,F-HD   sevelamer carbonate  0.8 g Oral TID with meals   ticagrelor  90 mg Oral BID   Continuous Infusions:  albumin human     cefTRIAXone (ROCEPHIN)  IV 1 g (06/24/22 0836)     LOS: 3 days   Cherene Altes, MD [facility] Office  [phone] Pager - Text Page per Shea Evans  If 7PM-7AM, please contact night-coverage per Amion 06/25/2022, 8:41 AM

## 2022-06-25 NOTE — Progress Notes (Addendum)
[facility] Progress Note   Subjective: Seen on HD. Multiple demands and complaints but no real issues. UF as tolerated.    Objective Vitals:   06/25/22 0849 06/25/22 0850 06/25/22 0900 06/25/22 0930  BP: (!) 141/64 (!) 141/64 96/80 (!) 162/64  Pulse: 73 74 74 76  Resp: (!) 24 (!) 23 16 (!) 22  Temp:      TempSrc:      SpO2: 100% 100% 100% 100%  Weight:       Physical Exam General: Chronically ill app[early elderly pt in NAD Heart: S1,S2 no M/R/G  Lungs: CTAB Abdomen: NABS, NT Extremities: R BKA with foam drsg on stump. No LLE edema.  Dialysis Access: R TDC blood lines connected   Additional Objective Labs: Basic Metabolic Panel: Recent Labs  Lab 06/22/22 0651 06/22/22 0838 06/22/22 0919 06/25/22 0751  NA 136 134* 135 135  K 4.9 3.5 3.8 3.9  CL 98  --  98 98  CO2  --   --  24 27  GLUCOSE 117*  --  139* 243*  BUN 44*  --  30* 38*  CREATININE 7.90*  --  7.35* 7.34*  CALCIUM  --   --  9.5 8.7*  PHOS  --   --   --  6.1*   Liver Function Tests: Recent Labs  Lab 06/22/22 0919 06/25/22 0751  AST 21  --   ALT 21  --   ALKPHOS 68  --   BILITOT 0.6  --   PROT 6.3*  --   ALBUMIN 2.8* 2.7*   No results for input(s): "LIPASE", "AMYLASE" in the last 168 hours. CBC: Recent Labs  Lab 06/22/22 0638 06/22/22 0651 06/22/22 0838 06/24/22 0604 06/25/22 0751  WBC 13.3*  --   --  12.9* 10.9*  HGB 11.9*   < > 11.9* 10.2* 10.2*  HCT 35.0*   < > 35.0* 30.2* 30.2*  MCV 97.2  --   --  94.7 93.8  PLT 334  --   --  337 322   < > = values in this interval not displayed.   Blood Culture    Component Value Date/Time   SDES BLOOD RIGHT HAND 06/22/2022 1332   SPECREQUEST  06/22/2022 1332    AEROBIC BOTTLE ONLY Blood Culture results may not be optimal due to an inadequate volume of blood received in culture bottles   CULT  06/22/2022 1332    NO GROWTH 2 DAYS Performed at [facility], [place] [address] [name], [place] [address]    REPTSTATUS PENDING  06/22/2022 1332    Cardiac Enzymes: No results for input(s): "CKTOTAL", "CKMB", "CKMBINDEX", "TROPONINI" in the last 168 hours. CBG: Recent Labs  Lab 06/23/22 0800 06/23/22 1140 06/23/22 2138 06/24/22 0546 06/25/22 0612  GLUCAP 122* 165* 273* 206* 240*   Iron Studies: No results for input(s): "IRON", "TIBC", "TRANSFERRIN", "FERRITIN" in the last 72 hours. '@lablastinr3'$ @ Studies/Results: No results found. Medications:  cefTRIAXone (ROCEPHIN)  IV 1 g (06/24/22 0836)    aspirin  81 mg Oral QHS   carvedilol  3.125 mg Oral BID WC   Chlorhexidine Gluconate Cloth  6 each Topical Q0600   diclofenac Sodium  2 g Topical QID   heparin injection (subcutaneous)  5,000 Units Subcutaneous Q8H   levETIRAcetam  750 mg Oral Daily   levETIRAcetam  375 mg Oral Q M,W,F-1800   [START ON 06/27/2022] midodrine  10 mg Oral Q M,W,F-HD   sevelamer carbonate  0.8 g Oral TID  with meals   ticagrelor  90 mg Oral BID     Outpatient Dialysis Orders: MWF - [place] 4 hrs 180NRE 350/700 79 kg 2.0 K/2.0 Ca UFP 4 No heparin - Mircera 200 mcg  IV q 2wks - last 04/29/22 as outpt, unclear if she got last admission - Hectorol 10mg IV TIW     Assessment/Plan: 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 DSharol Given  Preferred narcotic agents for pain control are hydromorphone, fentanyl, and methadone. Morphine should not be used. Avoid Baclofen 2. Acute Metabolic Encephalopathy/Confusion:  CT head negative. Intubated for airway protection but currently extubated. Neurology suspected seizure, adjusted keppra and recommended avoiding hypotension.  3. ESRD: Continue HD on usual MWF schedule. HD on 8/31 UF 1.1L but had to come off (hypotensive as well) and only had 2h336m. -Plan on HD tomorrow and then next week back to MWF to get back on schedule. 50018mF max tomorrow as she was hypotensive on Thur.   4. Anemia of ESRD: continue weekly Aranesp (200m24m Fri). Was on IV iron, stopped 8/5 because of ongoing  infection and then restarted. 5. Secondary hyperparathyroidism : CorrCa high, VDRA on hold. Phos at goal. Continue Renvela as binder when she is having GI intake. 6. HTN/volume: BP controlled.  UF with HD as tolerated. 7. Nutrition, continue protein supplements if available o/w consult dietician.  8. T2DM: Per primary.  9. Hx CVA: On dual ASA/brilinta, and Keppra.   Rita H. Brown NP-C 06/25/2022, 10:02 AM  [facility] [phone] on HD  RIJ TC  144/55 Required Albumin 25gm on 3K bath Goal UF 500 mL

## 2022-06-25 NOTE — Progress Notes (Signed)
Patient to HD this am. Midodrine 1 time dose given and reflected in MAR. Patient drank Nepro this AM at 0730

## 2022-06-25 NOTE — Progress Notes (Signed)
Received patient in bed to unit.  Alert and oriented.  Informed consent signed and in chart.   Treatment initiated: [place] Treatment completed: 1050  Patient tolerated well.  Transported back to the room  Alert, without acute distress.  Hand-off given to patient's nurse.   Access used: CVC  Access issues: no  Total UF removed: 74m Medication(s) given: albumin x2 dose.  Post HD VS: 97.9, 73, 21, 131/96, 100% 1L [place]  Post HD weight: 80.9 kg   Patient had multiple complaint while on treatment. Patient request to end treatment early d/t feeling not good; nausea, hungry and gas pain. Patient sign off ama. Patient ran for 1hour and 46 minutes removed 0.7L. CVC locked with 1.627mof heparin per lumens. [facility]

## 2022-06-26 DIAGNOSIS — G9340 Encephalopathy, unspecified: Secondary | ICD-10-CM | POA: Diagnosis not present

## 2022-06-26 LAB — GLUCOSE, CAPILLARY
Glucose-Capillary: 178 mg/dL — ABNORMAL HIGH (ref 70–99)
Glucose-Capillary: 169 mg/dL — ABNORMAL HIGH (ref 70–99)
Glucose-Capillary: 323 mg/dL — ABNORMAL HIGH (ref 70–99)
Glucose-Capillary: 225 mg/dL — ABNORMAL HIGH (ref 70–99)

## 2022-06-26 MED ORDER — METHYLPHENIDATE HCL 5 MG PO TABS
5.0000 mg | ORAL_TABLET | Freq: Two times a day (BID) | ORAL | Status: DC
  Administered 2022-06-26 – 2022-06-29 (×5): 5 mg via ORAL
  Filled 2022-06-26 (×6): qty 1

## 2022-06-26 NOTE — Progress Notes (Signed)
[facility] Progress Note   Outpatient Dialysis Orders: MWF - [place] 4 hrs 180NRE 350/700 79 kg 2.0 K/2.0 Ca UFP 4 No heparin - Mircera 200 mcg  IV q 2wks - last 04/29/22 as outpt, unclear if she got last admission - Hectorol 45mg IV TIW     Assessment/Plan: 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 DSharol Given  Preferred narcotic agents for pain control are hydromorphone, fentanyl, and methadone. Morphine should not be used. Avoid Baclofen 2. Acute Metabolic Encephalopathy/Confusion:  CT head negative. Intubated for airway protection but currently extubated. Neurology suspected seizure, adjusted keppra and recommended avoiding hypotension.  3. ESRD: Continue HD on usual MWF schedule. HD on 8/31 UF 1.1L but had to come off (hypotensive as well) and only had 2h348m. Tolerated on 9/2 (UF 0.7L)  -Plan on HD tomorrow back to MWF on outpt schedule.   4. Anemia of ESRD: continue weekly Aranesp (20077mq Fri). Was on IV iron, stopped 8/5 because of ongoing infection and then restarted. 5. Secondary hyperparathyroidism : CorrCa high, VDRA on hold. Phos at goal. Continue Renvela as binder when she is having GI intake. 6. HTN/volume: BP controlled.  UF with HD as tolerated. 7. Nutrition, continue protein supplements if available o/w consult dietician.  8. T2DM: Per primary.  9. Hx CVA: On dual ASA/brilinta, and Keppra.   Subjective: She is oriented and pleasant; denies f/c/n/v/sob.  Objective Vitals:   06/25/22 2047 06/26/22 0039 06/26/22 0341 06/26/22 0500  BP: (!) 136/56 (!) 163/84 (!) 161/84   Pulse: 71 81 79   Resp: '18 16 17   '$ Temp: 98.8 F (37.1 C) 99.4 F (37.4 C) 98.5 F (36.9 C)   TempSrc: Oral Oral Oral   SpO2: 100% 100% 100%   Weight:    80.9 kg   Physical Exam General: Chronically ill app[early elderly pt in NAD, pleasant and oriented Heart: S1,S2 no M/R/G  Lungs: CTAB Abdomen: NABS, NT Extremities: R BKA with foam drsg on stump. No LLE  edema.  [facility] Additional Objective Labs: Basic Metabolic Panel: Recent Labs  Lab 06/22/22 0651 06/22/22 0838 06/22/22 0919 06/25/22 0751  NA 136 134* 135 135  K 4.9 3.5 3.8 3.9  CL 98  --  98 98  CO2  --   --  24 27  GLUCOSE 117*  --  139* 243*  BUN 44*  --  30* 38*  CREATININE 7.90*  --  7.35* 7.34*  CALCIUM  --   --  9.5 8.7*  PHOS  --   --   --  6.1*   Liver Function Tests: Recent Labs  Lab 06/22/22 0919 06/25/22 0751  AST 21  --   ALT 21  --   ALKPHOS 68  --   BILITOT 0.6  --   PROT 6.3*  --   ALBUMIN 2.8* 2.7*   No results for input(s): "LIPASE", "AMYLASE" in the last 168 hours. CBC: Recent Labs  Lab 06/22/22 0638 06/22/22 0651 06/22/22 0838 06/24/22 0604 06/25/22 0751  WBC 13.3*  --   --  12.9* 10.9*  HGB 11.9*   < > 11.9* 10.2* 10.2*  HCT 35.0*   < > 35.0* 30.2* 30.2*  MCV 97.2  --   --  94.7 93.8  PLT 334  --   --  337 322   < > = values in this interval not displayed.   Blood Culture    Component Value Date/Time  SDES BLOOD RIGHT HAND 06/22/2022 1332   SPECREQUEST  06/22/2022 1332    AEROBIC BOTTLE ONLY Blood Culture results may not be optimal due to an inadequate volume of blood received in culture bottles   CULT  06/22/2022 1332    NO GROWTH 3 DAYS Performed at [facility], [place] [address], [place], [place] [address]    REPTSTATUS PENDING 06/22/2022 1332    Cardiac Enzymes: No results for input(s): "CKTOTAL", "CKMB", "CKMBINDEX", "TROPONINI" in the last 168 hours. CBG: Recent Labs  Lab 06/23/22 2138 06/24/22 0546 06/25/22 0612 06/25/22 1237 06/25/22 1621  GLUCAP 273* 206* 240* 160* 337*   Iron Studies: No results for input(s): "IRON", "TIBC", "TRANSFERRIN", "FERRITIN" in the last 72 hours. '@lablastinr3'$ @ Studies/Results: No results found. Medications:    aspirin  81 mg Oral QHS   carvedilol  3.125 mg Oral BID WC   Chlorhexidine Gluconate Cloth  6 each Topical Q0600   diclofenac Sodium  2 g  Topical QID   heparin injection (subcutaneous)  5,000 Units Subcutaneous Q8H   insulin aspart  0-6 Units Subcutaneous TID WC   levETIRAcetam  750 mg Oral Daily   levETIRAcetam  375 mg Oral Q M,W,F-1800   [START ON 06/27/2022] midodrine  10 mg Oral Q M,W,F-HD   sevelamer carbonate  0.8 g Oral TID with meals   ticagrelor  90 mg Oral BID

## 2022-06-26 NOTE — Progress Notes (Signed)
Carolyn Russo  RNH:[id number] DOB: 10/03/1951 DOA: 06/22/2022 PCP: [name], MD    Brief Narrative:  71 year old with a history of ESRD on HD, CHF, seizure disorder, DM2, CVA with residual left-sided weakness, and right BKA who presented to the [facility] ER after she was found to be hypoglycemic and confused at her SNF.  She was discovered lethargic in her bed after previously being at her baseline.  CBG was noted to be in the 40s.  On arrival in the ED her CBG had improved to 115 but she remained unresponsive.  She was subsequently intubated for airway protection and admitted to the ICU.  CT head at time of admission was negative and CTa of the head and neck revealed no large vessel occlusions.  Significant Events:  8/30 unresponsive in ED - intubated - admitted to ICU  8/30 extubated in ICU 3:30PM 83/1 EEG - noted evidence of epileptogenicity and cortical dysfunction right temporoparietal region with mild diffuse encephalopathy but no active seizure  Consultants:  PCCM Neurology Nephrology  Goals of Care:  Code Status: Full Code   DVT prophylaxis: Subcu heparin  Interim Hx: No acute events reported overnight.  Afebrile.  Vital signs stable with blood pressure modestly elevated between hemodialysis treatments.  CBG variable with range of 160-337 but no episodes of hypoglycemia.  The patient is much more alert today.  She is quite pleasant.  She denies any new complaints today.  She reports improving intake.  Assessment & Plan:  Toxic metabolic encephalopathy due to hypoglycemia and suspected seizure activity Neurology concerned about the likelihood of seizure activity - Keppra has been increased - no evidence of PRES on MRI - no observed seizure activity thus far during the hospital stay (was monitored w/ cEEG initially) - avoid hypotension as this could incite further seizure activity   DM2 w/ Hypoglycemia CBG in 40s at SNF at time of unresponsiveness - currently off insulin - on  lantus as outpt -continue to monitor CBG trend -favoring modest hyperglycemia in order to avoid potential for hypoglycemia  HTN patient requires midodrine with dialysis treatment therefore will avoid overcorrection between dialysis treatments as hypotension has the potential to exacerbate her seizure disorder and worsen her overall clinical condition  ?UTI POA Emperic rocephin was dosed -urine culture noted multiple species -unclear of the significance of this in a patient with ESRD and inconsistent urinary output -discontinued antibiotics after third dose -no persisting symptoms to suggest infection  ESRD on MWF HD Nephrology following -having some hypotension with treatments at times  Acute hypoxic respiratory failure - ruled out To clarify, this patient did not experience true hypoxic resp failure - she was intubated because she was not protecting her own airway when she was obtunded - she was not hypoxic   Family Communication: No family present at time of exam today Disposition: SNF placement pending (return to [facility])   Objective: Blood pressure (!) 155/100, pulse 78, temperature (!) 97.3 F (36.3 C), temperature source Oral, resp. rate 17, weight 80.9 kg, SpO2 100 %.  Intake/Output Summary (Last 24 hours) at 06/26/2022 0858 Last data filed at 06/26/2022 0545 Gross per 24 hour  Intake 240 ml  Output 550.7 ml  Net -310.7 ml    Filed Weights   06/25/22 0830 06/25/22 1055 06/26/22 0500  Weight: 81.6 kg 80.9 kg 80.9 kg    Examination: General: No acute respiratory distress Lungs: Clear to auscultation bilaterally without wheezing Cardiovascular: RRR Abdomen: Nontender, nondistended, soft, bowel sounds positive Extremities: No  significant edema  CBC: Recent Labs  Lab 06/22/22 0638 06/22/22 0651 06/22/22 0838 06/24/22 0604 06/25/22 0751  WBC 13.3*  --   --  12.9* 10.9*  HGB 11.9*   < > 11.9* 10.2* 10.2*  HCT 35.0*   < > 35.0* 30.2* 30.2*  MCV 97.2  --   --  94.7  93.8  PLT 334  --   --  337 322   < > = values in this interval not displayed.    Basic Metabolic Panel: Recent Labs  Lab 06/22/22 0651 06/22/22 0838 06/22/22 0919 06/25/22 0751  NA 136 134* 135 135  K 4.9 3.5 3.8 3.9  CL 98  --  98 98  CO2  --   --  24 27  GLUCOSE 117*  --  139* 243*  BUN 44*  --  30* 38*  CREATININE 7.90*  --  7.35* 7.34*  CALCIUM  --   --  9.5 8.7*  PHOS  --   --   --  6.1*    GFR: Estimated Creatinine Clearance: 7.2 mL/min (A) (by C-G formula based on SCr of 7.34 mg/dL (H)).  Scheduled Meds:  aspirin  81 mg Oral QHS   carvedilol  3.125 mg Oral BID WC   Chlorhexidine Gluconate Cloth  6 each Topical Q0600   diclofenac Sodium  2 g Topical QID   heparin injection (subcutaneous)  5,000 Units Subcutaneous Q8H   insulin aspart  0-6 Units Subcutaneous TID WC   levETIRAcetam  750 mg Oral Daily   levETIRAcetam  375 mg Oral Q M,W,F-1800   [START ON 06/27/2022] midodrine  10 mg Oral Q M,W,F-HD   sevelamer carbonate  0.8 g Oral TID with meals   ticagrelor  90 mg Oral BID     LOS: 4 days   Cherene Altes, MD [facility] Office  [phone] Pager - Text Page per Shea Evans  If 7PM-7AM, please contact night-coverage per Amion 06/26/2022, 8:58 AM

## 2022-06-27 DIAGNOSIS — G9340 Encephalopathy, unspecified: Secondary | ICD-10-CM | POA: Diagnosis not present

## 2022-06-27 LAB — CBC
HCT: 27.8 % — ABNORMAL LOW (ref 36.0–46.0)
Hemoglobin: 9.7 g/dL — ABNORMAL LOW (ref 12.0–15.0)
MCH: 32.3 pg (ref 26.0–34.0)
MCHC: 34.9 g/dL (ref 30.0–36.0)
MCV: 92.7 fL (ref 80.0–100.0)
Platelets: 364 10*3/uL (ref 150–400)
RBC: 3 MIL/uL — ABNORMAL LOW (ref 3.87–5.11)
RDW: 17.8 % — ABNORMAL HIGH (ref 11.5–15.5)
WBC: 13.7 10*3/uL — ABNORMAL HIGH (ref 4.0–10.5)
nRBC: 0 % (ref 0.0–0.2)

## 2022-06-27 LAB — CULTURE, BLOOD (ROUTINE X 2)
Culture: NO GROWTH
Culture: NO GROWTH
Special Requests: ADEQUATE

## 2022-06-27 LAB — GLUCOSE, CAPILLARY
Glucose-Capillary: 236 mg/dL — ABNORMAL HIGH (ref 70–99)
Glucose-Capillary: 210 mg/dL — ABNORMAL HIGH (ref 70–99)
Glucose-Capillary: 258 mg/dL — ABNORMAL HIGH (ref 70–99)
Glucose-Capillary: 138 mg/dL — ABNORMAL HIGH (ref 70–99)

## 2022-06-27 LAB — RENAL FUNCTION PANEL
Albumin: 2.9 g/dL — ABNORMAL LOW (ref 3.5–5.0)
Anion gap: 12 (ref 5–15)
BUN: 40 mg/dL — ABNORMAL HIGH (ref 8–23)
CO2: 24 mmol/L (ref 22–32)
Calcium: 9.1 mg/dL (ref 8.9–10.3)
Chloride: 101 mmol/L (ref 98–111)
Creatinine, Ser: 6.71 mg/dL — ABNORMAL HIGH (ref 0.44–1.00)
GFR, Estimated: 6 mL/min — ABNORMAL LOW (ref >60)
Glucose, Bld: 312 mg/dL — ABNORMAL HIGH (ref 70–99)
Phosphorus: 5.6 mg/dL — ABNORMAL HIGH (ref 2.5–4.6)
Potassium: 4.1 mmol/L (ref 3.5–5.1)
Sodium: 137 mmol/L (ref 135–145)

## 2022-06-27 MED ORDER — HEPARIN SODIUM (PORCINE) 1000 UNIT/ML IJ SOLN
INTRAMUSCULAR | Status: AC
  Administered 2022-06-27: 1000 [IU]
  Filled 2022-06-27: qty 4

## 2022-06-27 MED ORDER — ONDANSETRON HCL 4 MG/2ML IJ SOLN
4.0000 mg | Freq: Four times a day (QID) | INTRAMUSCULAR | Status: DC | PRN
  Administered 2022-06-27 – 2022-06-29 (×2): 4 mg via INTRAVENOUS
  Filled 2022-06-27 (×3): qty 2

## 2022-06-27 NOTE — Progress Notes (Signed)
[facility] Progress Note   Subjective: Seen on HD. Calmer today but early in treatment. No C/Os at present     Objective Vitals:   06/27/22 0900 06/27/22 0930 06/27/22 0932 06/27/22 1000  BP: (!) 191/92 (!) 179/68 (!) 179/68 (!) 134/54  Pulse: 73 79 79 80  Resp: '20 15 17 '$ (!) 25  Temp:      TempSrc:      SpO2: 100% 100% 100% 100%  Weight:       Physical Exam General: Chronically ill app[early elderly pt in NAD Heart: S1,S2 no M/R/G. SR with PVCs on monitor.  Lungs: CTAB Abdomen: NABS, NT Extremities: R BKA with foam drsg on stump. No LLE edema.  Dialysis Access: R TDC blood lines connected   Additional Objective Labs: Basic Metabolic Panel: Recent Labs  Lab 06/22/22 0919 06/25/22 0751 06/27/22 0342  NA 135 135 137  K 3.8 3.9 4.1  CL 98 98 101  CO2 '24 27 24  '$ GLUCOSE 139* 243* 312*  BUN 30* 38* 40*  CREATININE 7.35* 7.34* 6.71*  CALCIUM 9.5 8.7* 9.1  PHOS  --  6.1* 5.6*   Liver Function Tests: Recent Labs  Lab 06/22/22 0919 06/25/22 0751 06/27/22 0342  AST 21  --   --   ALT 21  --   --   ALKPHOS 68  --   --   BILITOT 0.6  --   --   PROT 6.3*  --   --   ALBUMIN 2.8* 2.7* 2.9*   No results for input(s): "LIPASE", "AMYLASE" in the last 168 hours. CBC: Recent Labs  Lab 06/22/22 0638 06/22/22 0651 06/24/22 0604 06/25/22 0751 06/27/22 0342  WBC 13.3*  --  12.9* 10.9* 13.7*  HGB 11.9*   < > 10.2* 10.2* 9.7*  HCT 35.0*   < > 30.2* 30.2* 27.8*  MCV 97.2  --  94.7 93.8 92.7  PLT 334  --  337 322 364   < > = values in this interval not displayed.   Blood Culture    Component Value Date/Time   SDES BLOOD RIGHT HAND 06/22/2022 1332   SPECREQUEST  06/22/2022 1332    AEROBIC BOTTLE ONLY Blood Culture results may not be optimal due to an inadequate volume of blood received in culture bottles   CULT  06/22/2022 1332    NO GROWTH 5 DAYS Performed at [facility], [place] [address] [place], [place] [address]    REPTSTATUS 06/27/2022  FINAL 06/22/2022 1332    Cardiac Enzymes: No results for input(s): "CKTOTAL", "CKMB", "CKMBINDEX", "TROPONINI" in the last 168 hours. CBG: Recent Labs  Lab 06/26/22 0824 06/26/22 1215 06/26/22 1637 06/26/22 2058 06/27/22 0617  GLUCAP 178* 169* 323* 225* 236*   Iron Studies: No results for input(s): "IRON", "TIBC", "TRANSFERRIN", "FERRITIN" in the last 72 hours. '@lablastinr3'$ @ Studies/Results: No results found. Medications:   aspirin  81 mg Oral QHS   carvedilol  3.125 mg Oral BID WC   Chlorhexidine Gluconate Cloth  6 each Topical Q0600   diclofenac Sodium  2 g Topical QID   heparin injection (subcutaneous)  5,000 Units Subcutaneous Q8H   insulin aspart  0-6 Units Subcutaneous TID WC   levETIRAcetam  750 mg Oral Daily   levETIRAcetam  375 mg Oral Q M,W,F-1800   methylphenidate  5 mg Oral BID WC   midodrine  10 mg Oral Q M,W,F-HD   sevelamer carbonate  0.8 g Oral TID with meals   ticagrelor  90 mg  Oral BID     Outpatient Dialysis Orders: MWF - [facility] 4 hrs 180NRE 350/700 79 kg 2.0 K/2.0 Ca UFP 4 No heparin - Mircera 200 mcg  IV q 2wks - last 04/29/22 as outpt, unclear if she got last admission - Hectorol 89mg IV TIW     Assessment/Plan: 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 DSharol Given  Preferred narcotic agents for pain control are hydromorphone, fentanyl, and methadone. Morphine should not be used. Avoid Baclofen 2. Acute Metabolic Encephalopathy/Confusion:  CT head negative. Intubated for airway protection but currently extubated. Neurology suspected seizure, adjusted keppra and recommended avoiding hypotension. Mental status variable. Per primary.  3. ESRD: Continue HD on usual MWF schedule. Next HD 06/29/2022.  4. Anemia of ESRD: HGB 9.7. Continue weekly Aranesp (2088m q Fri). 5. Secondary hyperparathyroidism : CorrCa 10.  VDRA on hold. Phos at goal. Continue binders.  6. HTN/volume: BP controlled and volume well controlled. UF with HD as  tolerated. 7. Nutrition-Low albumin. Add protein supplements.  8. T2DM: Per primary.  9. Hx CVA: On dual ASA/brilinta, and Keppra.  Per primary.   Terease Marcotte H. Antwan Pandya NP-C 06/27/2022, 10:16 AM  [phone]

## 2022-06-27 NOTE — Inpatient Diabetes Management (Signed)
Inpatient Diabetes Program Recommendations  AACE/ADA: New Consensus Statement on Inpatient Glycemic Control   Target Ranges:  Prepandial:   less than 140 mg/dL      Peak postprandial:   less than 180 mg/dL (1-2 hours)      Critically ill patients:  140 - 180 mg/dL    Latest Reference Range & Units 06/27/22 06:17  Glucose-Capillary 70 - 99 mg/dL 236 (H)    Latest Reference Range & Units 06/26/22 08:24 06/26/22 12:15 06/26/22 16:37 06/26/22 20:58  Glucose-Capillary 70 - 99 mg/dL 178 (H) 169 (H) 323 (H) 225 (H)    Latest Reference Range & Units 04/13/22 15:12 04/15/22 14:32 06/03/22 12:15 06/04/22 01:32  Hemoglobin A1C 4.8 - 5.6 % 6.1 (H) 5.9 (H) 4.7 (L) 4.6 (L)   Review of Glycemic Control  Diabetes history: DM2 Outpatient Diabetes medications: Lantus 13 units QHS, Fiasp 6-16 units TID as needed (sliding scale) Current orders for Inpatient glycemic control: Novolog 0-6 units TID with meals  Inpatient Diabetes Program Recommendations:    Insulin: Please consider ordering Novolog 2 units TID with meals for meal coverage if patient eats at least 50% of meals.  HbgA1C: A1C 4.6% on 06/04/22 indicating an average glucose of 85 mg/dl over the past 2-3 months. Would recommend adjusting outpatient DM medications at time of discharge.  Thanks, Barnie Alderman, RN, MSN, [facility] Coordinator Inpatient Diabetes Program [phone] (Team Pager from 8am to [place])

## 2022-06-27 NOTE — Progress Notes (Signed)
Carolyn Russo  GUY:[id number] DOB: 03/11/1951 DOA: 06/22/2022 PCP: [name], MD    Brief Narrative:  71 year old with a history of ESRD on HD, CHF, seizure disorder, DM2, CVA with residual left-sided weakness, and right BKA who presented to the [facility] ER after she was found to be hypoglycemic and confused at her SNF.  She was discovered lethargic in her bed after previously being at her baseline.  CBG was noted to be in the 40s.  On arrival in the ED her CBG had improved to 115 but she remained unresponsive.  She was subsequently intubated for airway protection and admitted to the ICU.  CT head at time of admission was negative and CTa of the head and neck revealed no large vessel occlusions.  Significant Events:  8/30 unresponsive in ED - intubated - admitted to ICU  8/30 extubated in ICU 3:30PM 83/1 EEG - noted evidence of epileptogenicity and cortical dysfunction right temporoparietal region with mild diffuse encephalopathy but no active seizure  Consultants:  PCCM Neurology Nephrology  Goals of Care:  Code Status: Full Code   DVT prophylaxis: Subcu heparin  Interim Hx: Afebrile.  Blood pressure trending upward.  Vital signs otherwise stable.  CBG range 169-323.  Tolerated dialysis well today.  No complaints at the time of visit.  Is alert and interactive.  Assessment & Plan:  Toxic metabolic encephalopathy due to hypoglycemia and suspected seizure activity Neurology concerned about the likelihood of seizure activity - Keppra has been increased - no evidence of PRES on MRI - no observed seizure activity thus far during the hospital stay (was monitored w/ cEEG initially) - avoid hypotension as this could incite further seizure activity   DM2 w/ Hypoglycemia CBG in 40s at SNF at time of unresponsiveness - currently off insulin - on lantus as outpt -continue to monitor CBG trend - favoring modest hyperglycemia in order to avoid potential for hypoglycemia  HTN patient requires  midodrine with dialysis treatment therefore will avoid overcorrection between dialysis treatments as hypotension has the potential to exacerbate her seizure disorder and worsen her overall clinical condition -suspect this morning's increase in blood pressure is a reflection of the need for dialysis -follow-up blood pressure after treatment  ?UTI POA Emperic rocephin was dosed -urine culture noted multiple species -unclear of the significance of this in a patient with ESRD and inconsistent urinary output -discontinued antibiotics after third dose -no persisting symptoms to suggest infection  ESRD on MWF HD Nephrology following -having some hypotension with treatments at times -monitor trend with dialysis today  Acute hypoxic respiratory failure - ruled out To clarify, this patient did not experience true hypoxic resp failure - she was intubated because she was not protecting her own airway when she was obtunded - she was not hypoxic   Recent R BKA  Family Communication: No family present at time of exam today Disposition: SNF placement pending (return to [facility])   Objective: Blood pressure (!) 166/91, pulse 81, temperature 97.8 F (36.6 C), temperature source Oral, resp. rate 16, weight 81.9 kg, SpO2 96 %.  Intake/Output Summary (Last 24 hours) at 06/27/2022 0829 Last data filed at 06/27/2022 0411 Gross per 24 hour  Intake --  Output 100 ml  Net -100 ml    Filed Weights   06/25/22 1055 06/26/22 0500 06/27/22 0433  Weight: 80.9 kg 80.9 kg 81.9 kg    Examination: General: No acute respiratory distress Lungs: Clear to auscultation bilaterally without wheezing Cardiovascular: RRR without murmur Abdomen: Nontender,  nondistended, soft, bowel sounds positive Extremities: No significant edema  CBC: Recent Labs  Lab 06/24/22 0604 06/25/22 0751 06/27/22 0342  WBC 12.9* 10.9* 13.7*  HGB 10.2* 10.2* 9.7*  HCT 30.2* 30.2* 27.8*  MCV 94.7 93.8 92.7  PLT 337 322 364    Basic  Metabolic Panel: Recent Labs  Lab 06/22/22 0919 06/25/22 0751 06/27/22 0342  NA 135 135 137  K 3.8 3.9 4.1  CL 98 98 101  CO2 '24 27 24  '$ GLUCOSE 139* 243* 312*  BUN 30* 38* 40*  CREATININE 7.35* 7.34* 6.71*  CALCIUM 9.5 8.7* 9.1  PHOS  --  6.1* 5.6*    GFR: Estimated Creatinine Clearance: 7.9 mL/min (A) (by C-G formula based on SCr of 6.71 mg/dL (H)).  Scheduled Meds:  aspirin  81 mg Oral QHS   carvedilol  3.125 mg Oral BID WC   Chlorhexidine Gluconate Cloth  6 each Topical Q0600   diclofenac Sodium  2 g Topical QID   heparin injection (subcutaneous)  5,000 Units Subcutaneous Q8H   insulin aspart  0-6 Units Subcutaneous TID WC   levETIRAcetam  750 mg Oral Daily   levETIRAcetam  375 mg Oral Q M,W,F-1800   methylphenidate  5 mg Oral BID WC   midodrine  10 mg Oral Q M,W,F-HD   sevelamer carbonate  0.8 g Oral TID with meals   ticagrelor  90 mg Oral BID     LOS: 5 days   Cherene Altes, MD [facility] Office  [phone] Pager - Text Page per Shea Evans  If 7PM-7AM, please contact night-coverage per Amion 06/27/2022, 8:29 AM

## 2022-06-28 ENCOUNTER — Ambulatory Visit: Payer: Medicare PPO | Admitting: Physician Assistant

## 2022-06-28 DIAGNOSIS — G9340 Encephalopathy, unspecified: Secondary | ICD-10-CM | POA: Diagnosis not present

## 2022-06-28 LAB — GLUCOSE, CAPILLARY
Glucose-Capillary: 210 mg/dL — ABNORMAL HIGH (ref 70–99)
Glucose-Capillary: 266 mg/dL — ABNORMAL HIGH (ref 70–99)

## 2022-06-28 MED ORDER — PROSOURCE PLUS PO LIQD
30.0000 mL | Freq: Two times a day (BID) | ORAL | Status: DC
  Administered 2022-06-28 – 2022-06-29 (×2): 30 mL via ORAL
  Filled 2022-06-28 (×3): qty 30

## 2022-06-28 MED ORDER — DARBEPOETIN ALFA 60 MCG/0.3ML IJ SOSY
60.0000 ug | PREFILLED_SYRINGE | INTRAMUSCULAR | Status: DC
Start: 2022-06-29 — End: 2022-06-30
  Administered 2022-06-29: 60 ug via INTRAVENOUS
  Filled 2022-06-28: qty 0.3

## 2022-06-28 NOTE — Progress Notes (Signed)
Physical Therapy Treatment Patient Details Name: ARCHER VISE MRN: [id number] DOB: Apr 04, 1951 Today's Date: 06/28/2022   History of Present Illness Pt is a 71 y.o. female who presented 06/22/22 with AMS and found to be unresponsive in the ED. SBP was ~220 upon arrival. MRI of brain revealed no acute findings. ETT 8/30-8/30. EEG showed evidence of epileptogenicity and cortical dysfunction in right temporoparietal region along with mild diffuse encephalopathy. Work-up pending. 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 Wanting RN to pour liquid medicine in her mouth vs her holding it and doing it herself. Pt requiring max verbal and tactile cues to initiate movement during transfer to chair but ultimately required maxAx2 for lateral scoot to drop arm recliner. Pt requiring max encouragement to do things for herself but appreciative of therapy and help. Acute PT to cont to follow.   Recommendations for follow up therapy are one component of a multi-disciplinary discharge planning process, led by the attending physician.  Recommendations may be updated based on patient status, additional functional criteria and insurance authorization.  Follow Up Recommendations  Skilled nursing-short term rehab (<3 hours/day) Can patient physically be transported by private vehicle: No   Assistance Recommended at Discharge Frequent or constant Supervision/Assistance  Patient can return home with the following Two people to help with walking and/or transfers;Two people to help with bathing/dressing/bathroom;Assistance with feeding;Direct supervision/assist for medications management;Direct supervision/assist for financial management;Assist for transportation   Equipment Recommendations   Other (comment) (defer to next venue of care)    Recommendations for Other Services       Precautions / Restrictions Precautions Precautions: Fall Precaution Comments: R BKA with limb protector Restrictions Weight Bearing Restrictions: Yes RLE Weight Bearing: Non weight bearing Other Position/Activity Restrictions: recommend elevation of RLE     Mobility  Bed Mobility Overal bed mobility: Needs Assistance Bed Mobility: Supine to Sit     Supine to sit: HOB elevated, Max assist     General bed mobility comments: pt with poor initiation of movement requiring max verbal and tactile cues to move LEs, maxA for trunk elevation and to bring hips to EOB    Transfers Overall transfer level: Needs assistance Equipment used: 2 person hand held assist (face to face transfer with bed pad)              Lateral/Scoot Transfers: +2 physical assistance, +2 safety/equipment, Max assist General transfer comment: attempted to have pt perform lateral scoot however very minimal ability to push up with LEs, strong R lateral lean, max verbal cues. pt ultimately required maxAx2 for 2 lateral scoots to clear bottom to get to chair using bed pad below pt    Ambulation/Gait               General Gait Details: unable   Stairs             Wheelchair Mobility    Modified Rankin (Stroke Patients Only) Modified Rankin (Stroke Patients Only) Pre-Morbid Rankin Score: Severe disability Modified Rankin: Severe disability     Balance Overall balance assessment: Needs assistance Sitting-balance support: No upper extremity supported, Bilateral upper extremity supported, Feet supported Sitting balance-Leahy Scale: Fair Sitting balance - Comments: Unable to reach L foot Postural control: Right  lateral lean (th emildest this PT has seen it this admission) Standing balance support: Bilateral upper extremity supported Standing balance-Leahy Scale: Zero Standing balance comment: MaxA and  bil UE support with L knee block to stand briefly                            Cognition Arousal/Alertness: Awake/alert Behavior During Therapy: Flat affect Overall Cognitive Status: History of cognitive impairments - at baseline Area of Impairment: Following commands, Problem solving, Awareness, Safety/judgement                 Orientation Level:  (now oriented to self, hospital and date) Current Attention Level: Sustained Memory: Decreased short-term memory Following Commands: Follows one step commands with increased time, Follows one step commands inconsistently Safety/Judgement: Decreased awareness of safety, Decreased awareness of deficits Awareness: Emergent Problem Solving: Slow processing, Difficulty sequencing, Decreased initiation General Comments: pt with decreased insight to deficits, slow to respond, appears self limiting wanting PT and RN to feed her and hold her cup, pt with limited active movement/initiation of task asked        Exercises      General Comments General comments (skin integrity, edema, etc.): VSS      Pertinent Vitals/Pain Pain Assessment Pain Assessment: No/denies pain    Home Living                          Prior Function            PT Goals (current goals can now be found in the care plan section) Acute Rehab PT Goals Patient Stated Goal: to get stronger PT Goal Formulation: With patient Time For Goal Achievement: 07/07/22 Potential to Achieve Goals: Fair Progress towards PT goals: Progressing toward goals    Frequency    Min 2X/week      PT Plan Current plan remains appropriate    Co-evaluation              AM-PAC PT "6 Clicks" Mobility   Outcome Measure  Help needed turning from your back to your side while in a flat bed without using bedrails?: A Lot Help needed moving from lying on your back to sitting on the side of a flat bed without using bedrails?: A Lot Help needed moving to and  from a bed to a chair (including a wheelchair)?: Total Help needed standing up from a chair using your arms (e.g., wheelchair or bedside chair)?: A Lot Help needed to walk in hospital room?: Total Help needed climbing 3-5 steps with a railing? : Total 6 Click Score: 9    End of Session   Activity Tolerance: Patient tolerated treatment well Patient left: in chair;with call bell/phone within reach;with chair alarm set Nurse Communication: Mobility status PT Visit Diagnosis: Muscle weakness (generalized) (M62.81);Other abnormalities of gait and mobility (R26.89);Unsteadiness on feet (R26.81)     Time: 5284-1324 PT Time Calculation (min) (ACUTE ONLY): 22 min  Charges:  $Therapeutic Activity: 8-22 mins                     Kittie Plater, PT, DPT Acute Rehabilitation Services Secure chat preferred Office #: [phone]    Berline Lopes 06/28/2022, 12:31 PM

## 2022-06-28 NOTE — TOC Progression Note (Signed)
Transition of Care [facility]) - Progression Note    Patient Details  Name: Carolyn Russo MRN: [id number] Date of Birth: [birth year]  Transition of Care [facility]) CM/SW [place],  Phone Number: 06/28/2022, 2:52 PM  Clinical Narrative:   CSW submitted for insurance authorization for return to SNF when stable. CSW to follow.    Expected Discharge Plan: [place] Barriers to Discharge: Continued Medical Work up, Ship broker  Expected Discharge Plan and Services Expected Discharge Plan: [place] Choice: [place] arrangements for the past 2 months: Single Family Home, [place]                                       Social Determinants of Health (SDOH) Interventions    Readmission Risk Interventions     No data to display

## 2022-06-28 NOTE — Progress Notes (Signed)
Carolyn Russo  KKX:[id number] DOB: [birth year] DOA: 06/22/2022 PCP: [name], MD    Brief Narrative:  71 year old with a history of ESRD on HD, CHF, seizure disorder, DM2, CVA with residual left-sided weakness, and right BKA who presented to the [facility] ER after she was found to be hypoglycemic and confused at her SNF.  She was discovered lethargic in her bed after previously being at her baseline.  CBG was noted to be in the 40s.  On arrival in the ED her CBG had improved to 115 but she remained unresponsive.  She was subsequently intubated for airway protection and admitted to the ICU.  CT head at time of admission was negative and CTa of the head and neck revealed no large vessel occlusions.  Significant Events:  8/30 unresponsive in ED - intubated - admitted to ICU  8/30 extubated in ICU 3:30PM 83/1 EEG - noted evidence of epileptogenicity and cortical dysfunction right temporoparietal region with mild diffuse encephalopathy but no active seizure  Consultants:  PCCM Neurology Nephrology  Goals of Care:  Code Status: Full Code   DVT prophylaxis: Subcu heparin  Interim Hx: No acute events reported overnight.  Vital signs stable.  CBG variable at 138-323.  Alert conversant and pleasant today.  Denies new complaints.  Reports improved appetite.  Assessment & Plan:  Toxic metabolic encephalopathy due to hypoglycemia and suspected seizure activity Neurology concerned about the likelihood of seizure activity - Keppra has been increased - no evidence of PRES on MRI - no observed seizure activity thus far during the hospital stay (was monitored w/ cEEG initially) - avoid hypotension as this could incite further seizure activity   DM2 w/ Hypoglycemia CBG in 40s at SNF at time of unresponsiveness - A1c 4.6 - currently off scheduled insulin - on lantus as outpt -continue to monitor CBG trend - favoring modest hyperglycemia in order to avoid potential for hypoglycemia  HTN patient  requires midodrine with dialysis treatment therefore will avoid overcorrection between dialysis treatments as hypotension has the potential to exacerbate her seizure disorder and worsen her overall clinical condition  ?UTI POA Emperic rocephin was dosed -urine culture noted multiple species -unclear of the significance of this in a patient with ESRD and inconsistent urinary output -discontinued antibiotics after third dose -no persisting symptoms to suggest infection  ESRD on MWF HD Nephrology following -having some hypotension with treatments at times -monitor trend with dialysis   Acute hypoxic respiratory failure - ruled out To clarify, this patient did not experience true hypoxic resp failure - she was intubated because she was not protecting her own airway when she was obtunded - she was not hypoxic   Recent R BKA I communicated with Dr. Sharol Given today who has agreed to come by and make a routine check of the patient's wound while she is in the hospital  Family Communication: No family present at time of exam today Disposition: SNF placement pending (return to [facility]) -medically stable for discharge when insurance authorization obtained   Objective: Blood pressure (!) 169/72, pulse 76, temperature 98.4 F (36.9 C), temperature source Oral, resp. rate 20, weight 81.9 kg, SpO2 96 %.  Intake/Output Summary (Last 24 hours) at 06/28/2022 0913 Last data filed at 06/28/2022 0400 Gross per 24 hour  Intake 60 ml  Output 1250 ml  Net -1190 ml    Filed Weights   06/27/22 0745 06/27/22 1215 06/28/22 0438  Weight: 79.5 kg 78.3 kg 81.9 kg    Examination: General: No acute  respiratory distress Lungs: Clear to auscultation bilaterally -no wheezing or crackles Cardiovascular: RRR without murmur Abdomen: Nontender, nondistended, soft, bowel sounds positive Extremities: No significant edema -right extremity wound dressed and dry  CBC: Recent Labs  Lab 06/24/22 0604 06/25/22 0751  06/27/22 0342  WBC 12.9* 10.9* 13.7*  HGB 10.2* 10.2* 9.7*  HCT 30.2* 30.2* 27.8*  MCV 94.7 93.8 92.7  PLT 337 322 371    Basic Metabolic Panel: Recent Labs  Lab 06/22/22 0919 06/25/22 0751 06/27/22 0342  NA 135 135 137  K 3.8 3.9 4.1  CL 98 98 101  CO2 '24 27 24  '$ GLUCOSE 139* 243* 312*  BUN 30* 38* 40*  CREATININE 7.35* 7.34* 6.71*  CALCIUM 9.5 8.7* 9.1  PHOS  --  6.1* 5.6*    GFR: Estimated Creatinine Clearance: 7.9 mL/min (A) (by C-G formula based on SCr of 6.71 mg/dL (H)).  Scheduled Meds:  aspirin  81 mg Oral QHS   carvedilol  3.125 mg Oral BID WC   Chlorhexidine Gluconate Cloth  6 each Topical Q0600   diclofenac Sodium  2 g Topical QID   heparin injection (subcutaneous)  5,000 Units Subcutaneous Q8H   insulin aspart  0-6 Units Subcutaneous TID WC   levETIRAcetam  750 mg Oral Daily   levETIRAcetam  375 mg Oral Q M,W,F-1800   methylphenidate  5 mg Oral BID WC   midodrine  10 mg Oral Q M,W,F-HD   sevelamer carbonate  0.8 g Oral TID with meals   ticagrelor  90 mg Oral BID     LOS: 6 days   Cherene Altes, MD [facility] Office  [phone] Pager - Text Page per Shea Evans  If 7PM-7AM, please contact night-coverage per Amion 06/28/2022, 9:13 AM

## 2022-06-28 NOTE — Progress Notes (Signed)
[facility] Progress Note   Subjective:  Seen in room - sitting up in recliner and waiting for lunch. Denies CP/dyspnea, very calm and appropriate today.  Objective Vitals:   06/28/22 0130 06/28/22 0438 06/28/22 0732 06/28/22 1207  BP:  (!) 154/67 (!) 169/72 (!) 158/72  Pulse: 75 74 76 77  Resp:  '18 20 18  '$ Temp:  98 F (36.7 C) 98.4 F (36.9 C) 98.8 F (37.1 C)  TempSrc:  Oral Oral Oral  SpO2: 100% 98% 96% 97%  Weight:  81.9 kg     Physical Exam General: Elderly woman, NAD. Room air. Heart: RRR; 2/6 murmur Lungs: CTAB; no rales Abdomen: soft Extremities: No LE edema Dialysis Access: TDC in R chest  Additional Objective Labs: Basic Metabolic Panel: Recent Labs  Lab 06/22/22 0919 06/25/22 0751 06/27/22 0342  NA 135 135 137  K 3.8 3.9 4.1  CL 98 98 101  CO2 '24 27 24  '$ GLUCOSE 139* 243* 312*  BUN 30* 38* 40*  CREATININE 7.35* 7.34* 6.71*  CALCIUM 9.5 8.7* 9.1  PHOS  --  6.1* 5.6*   Liver Function Tests: Recent Labs  Lab 06/22/22 0919 06/25/22 0751 06/27/22 0342  AST 21  --   --   ALT 21  --   --   ALKPHOS 68  --   --   BILITOT 0.6  --   --   PROT 6.3*  --   --   ALBUMIN 2.8* 2.7* 2.9*   CBC: Recent Labs  Lab 06/22/22 0638 06/22/22 0651 06/24/22 0604 06/25/22 0751 06/27/22 0342  WBC 13.3*  --  12.9* 10.9* 13.7*  HGB 11.9*   < > 10.2* 10.2* 9.7*  HCT 35.0*   < > 30.2* 30.2* 27.8*  MCV 97.2  --  94.7 93.8 92.7  PLT 334  --  337 322 364   < > = values in this interval not displayed.   Blood Culture    Component Value Date/Time   SDES BLOOD RIGHT HAND 06/22/2022 1332   SPECREQUEST  06/22/2022 1332    AEROBIC BOTTLE ONLY Blood Culture results may not be optimal due to an inadequate volume of blood received in culture bottles   CULT  06/22/2022 1332    NO GROWTH 5 DAYS Performed at [facility], [place] [address] [place], [place] [address]    REPTSTATUS 06/27/2022 FINAL 06/22/2022 1332   Medications:   aspirin  81 mg Oral  QHS   carvedilol  3.125 mg Oral BID WC   Chlorhexidine Gluconate Cloth  6 each Topical Q0600   diclofenac Sodium  2 g Topical QID   heparin injection (subcutaneous)  5,000 Units Subcutaneous Q8H   insulin aspart  0-6 Units Subcutaneous TID WC   levETIRAcetam  750 mg Oral Daily   levETIRAcetam  375 mg Oral Q M,W,F-1800   methylphenidate  5 mg Oral BID WC   midodrine  10 mg Oral Q M,W,F-HD   sevelamer carbonate  0.8 g Oral TID with meals   ticagrelor  90 mg Oral BID    Dialysis Orders: MWF - [facility] [place] 4 hrs 180NRE 350/700 79 kg 2.0 K/2.0 Ca UFP 4 No heparin - Mircera 200 mcg  IV q 2wks - last 04/29/22 as outpt, unclear if she got last admission - Hectorol 12mg IV TIW  Assessment/Plan: Unresponsiveness/?seizure: Presented unresponsive. Intubated for airway protection but then came around and extubated without issues. Head CT was negative. EEG was concerning for seizure -  neuro has adjusted her medications. Hx R BKA 05/18/22 by Dr. Sharol Given for gangrene. Pain control ok at this time per patient. ESRD: Continue HD on usual MWF schedule - HD tomorrow. Anemia of ESRD: Hgb 9.7 - resume Aranesp tomorrow with HD. Secondary hyperparathyroidism: CorrCa up slightly, VDRA on hold. Phos at goal. Continue binders.  HTN/volume: BP slightly high, keep EDW same for now. Nutrition: Alb low, adding supplements. T2DM: Per primary.  Hx CVA: On dual ASA/brilinta, and Keppra.  Per primary.   Veneta Penton, PA-C 06/28/2022, 1:00 PM  [employer]

## 2022-06-29 ENCOUNTER — Ambulatory Visit (HOSPITAL_COMMUNITY): Payer: Medicare PPO

## 2022-06-29 ENCOUNTER — Ambulatory Visit: Payer: Medicare PPO

## 2022-06-29 DIAGNOSIS — I739 Peripheral vascular disease, unspecified: Secondary | ICD-10-CM | POA: Diagnosis present

## 2022-06-29 DIAGNOSIS — R404 Transient alteration of awareness: Secondary | ICD-10-CM | POA: Diagnosis not present

## 2022-06-29 DIAGNOSIS — R569 Unspecified convulsions: Secondary | ICD-10-CM | POA: Diagnosis not present

## 2022-06-29 DIAGNOSIS — R1312 Dysphagia, oropharyngeal phase: Secondary | ICD-10-CM | POA: Diagnosis not present

## 2022-06-29 DIAGNOSIS — G9341 Metabolic encephalopathy: Secondary | ICD-10-CM | POA: Diagnosis not present

## 2022-06-29 DIAGNOSIS — I132 Hypertensive heart and chronic kidney disease with heart failure and with stage 5 chronic kidney disease, or end stage renal disease: Secondary | ICD-10-CM | POA: Diagnosis not present

## 2022-06-29 DIAGNOSIS — Z7401 Bed confinement status: Secondary | ICD-10-CM | POA: Diagnosis not present

## 2022-06-29 DIAGNOSIS — R2681 Unsteadiness on feet: Secondary | ICD-10-CM | POA: Diagnosis not present

## 2022-06-29 DIAGNOSIS — N2581 Secondary hyperparathyroidism of renal origin: Secondary | ICD-10-CM | POA: Diagnosis not present

## 2022-06-29 DIAGNOSIS — N390 Urinary tract infection, site not specified: Secondary | ICD-10-CM | POA: Diagnosis not present

## 2022-06-29 DIAGNOSIS — N186 End stage renal disease: Secondary | ICD-10-CM | POA: Diagnosis not present

## 2022-06-29 DIAGNOSIS — Z4781 Encounter for orthopedic aftercare following surgical amputation: Secondary | ICD-10-CM | POA: Diagnosis not present

## 2022-06-29 DIAGNOSIS — S88111S Complete traumatic amputation at level between knee and ankle, right lower leg, sequela: Secondary | ICD-10-CM | POA: Diagnosis not present

## 2022-06-29 DIAGNOSIS — I120 Hypertensive chronic kidney disease with stage 5 chronic kidney disease or end stage renal disease: Secondary | ICD-10-CM | POA: Diagnosis not present

## 2022-06-29 DIAGNOSIS — E1122 Type 2 diabetes mellitus with diabetic chronic kidney disease: Secondary | ICD-10-CM | POA: Diagnosis not present

## 2022-06-29 DIAGNOSIS — E08649 Diabetes mellitus due to underlying condition with hypoglycemia without coma: Secondary | ICD-10-CM | POA: Diagnosis not present

## 2022-06-29 DIAGNOSIS — Z794 Long term (current) use of insulin: Secondary | ICD-10-CM

## 2022-06-29 DIAGNOSIS — E162 Hypoglycemia, unspecified: Secondary | ICD-10-CM | POA: Diagnosis not present

## 2022-06-29 DIAGNOSIS — I5032 Chronic diastolic (congestive) heart failure: Secondary | ICD-10-CM | POA: Diagnosis not present

## 2022-06-29 DIAGNOSIS — E1165 Type 2 diabetes mellitus with hyperglycemia: Secondary | ICD-10-CM | POA: Diagnosis not present

## 2022-06-29 DIAGNOSIS — I2510 Atherosclerotic heart disease of native coronary artery without angina pectoris: Secondary | ICD-10-CM | POA: Diagnosis not present

## 2022-06-29 DIAGNOSIS — G8194 Hemiplegia, unspecified affecting left nondominant side: Secondary | ICD-10-CM | POA: Diagnosis not present

## 2022-06-29 DIAGNOSIS — I10 Essential (primary) hypertension: Secondary | ICD-10-CM | POA: Diagnosis not present

## 2022-06-29 DIAGNOSIS — Z8673 Personal history of transient ischemic attack (TIA), and cerebral infarction without residual deficits: Secondary | ICD-10-CM | POA: Diagnosis not present

## 2022-06-29 DIAGNOSIS — S88111D Complete traumatic amputation at level between knee and ankle, right lower leg, subsequent encounter: Secondary | ICD-10-CM | POA: Diagnosis not present

## 2022-06-29 DIAGNOSIS — Z741 Need for assistance with personal care: Secondary | ICD-10-CM | POA: Diagnosis not present

## 2022-06-29 DIAGNOSIS — I69391 Dysphagia following cerebral infarction: Secondary | ICD-10-CM | POA: Diagnosis not present

## 2022-06-29 DIAGNOSIS — I96 Gangrene, not elsewhere classified: Secondary | ICD-10-CM | POA: Diagnosis not present

## 2022-06-29 DIAGNOSIS — R2689 Other abnormalities of gait and mobility: Secondary | ICD-10-CM | POA: Diagnosis not present

## 2022-06-29 DIAGNOSIS — R531 Weakness: Secondary | ICD-10-CM | POA: Diagnosis not present

## 2022-06-29 DIAGNOSIS — M6281 Muscle weakness (generalized): Secondary | ICD-10-CM | POA: Diagnosis not present

## 2022-06-29 DIAGNOSIS — R41841 Cognitive communication deficit: Secondary | ICD-10-CM | POA: Diagnosis not present

## 2022-06-29 DIAGNOSIS — Z992 Dependence on renal dialysis: Secondary | ICD-10-CM | POA: Diagnosis not present

## 2022-06-29 DIAGNOSIS — E785 Hyperlipidemia, unspecified: Secondary | ICD-10-CM | POA: Diagnosis not present

## 2022-06-29 DIAGNOSIS — G9340 Encephalopathy, unspecified: Secondary | ICD-10-CM | POA: Diagnosis not present

## 2022-06-29 DIAGNOSIS — M6259 Muscle wasting and atrophy, not elsewhere classified, multiple sites: Secondary | ICD-10-CM | POA: Diagnosis not present

## 2022-06-29 DIAGNOSIS — D631 Anemia in chronic kidney disease: Secondary | ICD-10-CM | POA: Diagnosis not present

## 2022-06-29 LAB — CBC
HCT: 27.9 % — ABNORMAL LOW (ref 36.0–46.0)
Hemoglobin: 9.7 g/dL — ABNORMAL LOW (ref 12.0–15.0)
MCH: 32.4 pg (ref 26.0–34.0)
MCHC: 34.8 g/dL (ref 30.0–36.0)
MCV: 93.3 fL (ref 80.0–100.0)
Platelets: 358 10*3/uL (ref 150–400)
RBC: 2.99 MIL/uL — ABNORMAL LOW (ref 3.87–5.11)
RDW: 17.7 % — ABNORMAL HIGH (ref 11.5–15.5)
WBC: 13.4 10*3/uL — ABNORMAL HIGH (ref 4.0–10.5)
nRBC: 0 % (ref 0.0–0.2)

## 2022-06-29 LAB — GLUCOSE, CAPILLARY
Glucose-Capillary: 151 mg/dL — ABNORMAL HIGH (ref 70–99)
Glucose-Capillary: 216 mg/dL — ABNORMAL HIGH (ref 70–99)
Glucose-Capillary: 219 mg/dL — ABNORMAL HIGH (ref 70–99)

## 2022-06-29 LAB — RENAL FUNCTION PANEL
Albumin: 2.8 g/dL — ABNORMAL LOW (ref 3.5–5.0)
Anion gap: 11 (ref 5–15)
BUN: 30 mg/dL — ABNORMAL HIGH (ref 8–23)
CO2: 26 mmol/L (ref 22–32)
Calcium: 9.3 mg/dL (ref 8.9–10.3)
Chloride: 101 mmol/L (ref 98–111)
Creatinine, Ser: 6 mg/dL — ABNORMAL HIGH (ref 0.44–1.00)
GFR, Estimated: 7 mL/min — ABNORMAL LOW (ref >60)
Glucose, Bld: 135 mg/dL — ABNORMAL HIGH (ref 70–99)
Phosphorus: 4.9 mg/dL — ABNORMAL HIGH (ref 2.5–4.6)
Potassium: 4.5 mmol/L (ref 3.5–5.1)
Sodium: 138 mmol/L (ref 135–145)

## 2022-06-29 MED ORDER — POLYETHYLENE GLYCOL 3350 17 G PO PACK: 17.0000 g | PACK | Freq: Every day | ORAL | 0 refills | Status: DC | PRN

## 2022-06-29 MED ORDER — INSULIN GLARGINE 100 UNIT/ML SOLOSTAR PEN: 8.0000 [IU] | PEN_INJECTOR | Freq: Every day | SUBCUTANEOUS | Status: DC

## 2022-06-29 MED ORDER — INSULIN ASPART 100 UNIT/ML IJ SOLN: 0.0000 [IU] | Freq: Three times a day (TID) | INTRAMUSCULAR | Status: DC

## 2022-06-29 MED ORDER — LEVETIRACETAM 750 MG PO TABS: 750.0000 mg | ORAL_TABLET | Freq: Every day | ORAL | Status: DC

## 2022-06-29 MED ORDER — ALTEPLASE 2 MG IJ SOLR: 2.0000 mg | Freq: Once | INTRAMUSCULAR | Status: DC | PRN

## 2022-06-29 MED ORDER — LEVETIRACETAM 250 MG PO TABS: 375.0000 mg | ORAL_TABLET | ORAL | Status: DC

## 2022-06-29 MED ORDER — INSULIN GLARGINE 100 UNIT/ML SOLOSTAR PEN: 5.0000 [IU] | PEN_INJECTOR | Freq: Every day | SUBCUTANEOUS | Status: DC

## 2022-06-29 MED ORDER — MIDODRINE HCL 10 MG PO TABS: 10.0000 mg | ORAL_TABLET | ORAL | Status: DC

## 2022-06-29 MED ORDER — ANTICOAGULANT SODIUM CITRATE 4% (200MG/5ML) IV SOLN
5.0000 mL | Status: DC | PRN
  Filled 2022-06-29: qty 5

## 2022-06-29 MED ORDER — PROSOURCE PLUS PO LIQD: 30.0000 mL | Freq: Two times a day (BID) | ORAL | Status: DC

## 2022-06-29 NOTE — Progress Notes (Signed)
Report called to [facility] and Rehab. And given to Debrisa, LPN.  Pt. Will go there with PTAR to room 223

## 2022-06-29 NOTE — Progress Notes (Signed)
Patient ID: Carolyn Russo, female   DOB: [birth year], 71 y.o.   MRN: [id number] Examination of the right transtibial amputation incision shows healthy margins there is some mild ischemic changes but no wound breakdown no cellulitis no drainage.  Would continue dry dressing changes changed as needed with compression such as Ace wrap.

## 2022-06-29 NOTE — Progress Notes (Signed)
OT Cancellation Note  Patient Details Name: Carolyn Russo MRN: [id number] DOB: 10/03/1951   Cancelled Treatment:    Reason Eval/Treat Not Completed: Patient at procedure or test/ unavailable (Pt at HD treatment). Will return as schedule allows   Shanda Howells, OTR/L [facility] Acute Rehabilitation Office: [phone]   Carolyn Russo 06/29/2022, 7:49 AM

## 2022-06-29 NOTE — TOC Transition Note (Signed)
Transition of Care [facility]) - CM/SW Discharge Note   Patient Details  Name: Carolyn Russo MRN: [id number] Date of Birth: [birth year]  [facility]) CM/SW Contact:  Geralynn Ochs, LCSW Phone Number: 06/29/2022, 5:47 PM   Clinical Narrative:   CSW received insurance approval for patient, asked MD about medical stability. Patient medically stable for SNF today. CSW updated patient's spouse, but he wanted to speak with MD and surgeon prior to discharge. CSW confirmed with MD that he spoke with spouse, and surgeon had made recommendations; husband in agreement to transition to SNF. CSW explained to husband how to access patient's discharge summary via MyChart or medical records. Transport scheduled with PTAR for next available.  Nurse to call report to [phone], Room 223.    Final next level of care: [place] Barriers to Discharge: Barriers Resolved   Patient Goals and CMS Choice Patient states their goals for this hospitalization and ongoing recovery are:: to get back to [facility].gov Compare Post Acute Care list provided to:: Patient Choice offered to / list presented to : Patient  Discharge Placement              Patient chooses bed at: [facility] Patient to be transferred to facility by: [place] Name of family member notified: Husband Patient and family notified of of transfer: 06/29/22  Discharge Plan and Services     Post Acute Care Choice: [place]                               Social Determinants of Health (SDOH) Interventions     Readmission Risk Interventions     No data to display

## 2022-06-29 NOTE — Discharge Summary (Addendum)
Physician Discharge Summary   Patient: Carolyn Russo MRN: [id number] DOB: 04-Jan-1951  Admit date:     06/22/2022  Discharge date: 06/29/22  Discharge Physician: Flora Lipps   PCP: [name], MD   Recommendations at discharge:   Follow-up with your primary care provider at the skilled nursing facility in 3 to 5 days.  Check CBC BMP magnesium LFT in the next visit next Follow-up with orthopedics Dr Sharol Given for post amputation follow-up as scheduled by you. Patient has been adjusted on seizure medications while in the hospital.  Please take note of increased doses. Dose of Lantus has been decreased including sliding scale insulin intensity.  Favor moderate hyperglycemia to avoid hypoglycemia.  Would continue dry dressing changes changed as needed with compression such as Ace wrap at the amputation site.Marland Kitchen  Discharge Diagnoses: Principal Problem:   Acute encephalopathy Active Problems:   Seizure ([place])   Urinary tract infection without hematuria   Diabetes mellitus due to underlying condition with hypoglycemia without coma, with long-term current use of insulin (HCC)  Resolved Problems:   * No resolved hospital problems. *  Hospital Course: 71 year old with past medical history of ESRD on HD, CHF, seizure disorder, please mellitus type II, CVA with residual left-sided weakness, and right BKA presented to hospital from skilled nursing facility with hypoglycemia and confusion.  She was noted to be lethargic in her bed after previously being at her baseline.  CBG was noted to be in the 40s.  On arrival in the ED her CBG had improved to 115 but she remained unresponsive.  She was subsequently intubated for airway protection and admitted to the ICU.  EEG did not show any evidence of active seizures.  CT head at time of admission was negative and CTA of the head and neck revealed no large vessel occlusions.  Neurology nephrology and critical care were consulted during  hospitalization.  Following conditions were addressed during hospitalization,  Toxic metabolic encephalopathy due to hypoglycemia and suspected seizure activity.  Keppra dose has been increased at this time.  MRI of the brain without any acute findings.  Oxycodone has been discontinued at this time.  Reassess need during skilled nursing facility stay.   DM type II with hypoglycemia Hypoglycemia noted at the skilled nursing facility.  Hemoglobin A1c of 4.6.  Dose of Lantus has been decreased to 5 units at this time and sliding scale intensity has been decreased.  Please continue to monitor closely at the skilled nursing facility.  Would favor modest hyperglycemia in order to avoid potential for hypoglycemia.   Essential hypertension  On Coreg and losartan at home.  We will discontinue losartan.  Midodrine during hemodialysis days has been initiated.   Possible UTI  Patient received empiric Rocephin.  Urine culture showed multiple species.  Has been discontinued at this time.   ESRD on MWF HD Patient underwent hemodialysis during hospitalization.  Midodrine has been added to the regimen during hemodialysis days.   Recent right below-knee amputation  Follows up with orthopedics Dr. Sharol Given as outpatient.  Denies pain.  Dr Sharol Given orthopedics has seen the patient and recommended dry dressing changes changed as needed with compression such as Ace wrap at the amputation site.  Consultants:  Nephrology PCCM Orthopedics  Procedures performed:  Intubation and extubation Hemodialysis  Disposition: Skilled nursing facility  Diet recommendation:  Discharge Diet Orders (From admission, onward)     Start     Ordered   06/29/22 0000  Diet - low sodium heart healthy  06/29/22 1316   06/29/22 0000  Diet Carb Modified        06/29/22 1316           Cardiac and Carb modified diet DISCHARGE MEDICATION: Allergies as of 06/29/2022       Reactions   Food Anaphylaxis   Peanuts -  anaphylaxis Almonds - anaphylaxis Not listed on MAR   Wheat Bran Other (See Comments)   Upset stomach  Not listed on MAR   Statins Itching, Other (See Comments)   Generalized aches- tolerates crestor  Not listed on MAR   Gadolinium Derivatives Other (See Comments)   Gadolinium-Containing Contrast Media Listed as an allergy on MAR Unknown reaction   Januvia [sitagliptin] Other (See Comments)   Listed as an allergy on MAR Unknown reaction   Pork-derived Products Other (See Comments)   Does not eat pork  Listed on MAR   Shellfish Allergy Other (See Comments)   Mouth gets raw Listed on MAR   Tetracycline Other (See Comments)   Raw mouth Not listed on MAR        Medication List     STOP taking these medications    Banophen 25 MG tablet Generic drug: diphenhydrAMINE   Fiasp FlexTouch 100 UNIT/ML FlexTouch Pen Generic drug: insulin aspart   losartan 100 MG tablet Commonly known as: COZAAR   oxyCODONE 5 MG immediate release tablet Commonly known as: Oxy IR/ROXICODONE       TAKE these medications    acetaminophen 500 MG tablet Commonly known as: TYLENOL Take 1,000 mg by mouth 2 (two) times daily as needed (leg and arm pain).   aspirin EC 81 MG tablet Take 1 tablet (81 mg total) by mouth daily. Swallow whole. What changed:  when to take this additional instructions   carvedilol 3.125 MG tablet Commonly known as: COREG Take 1 tablet (3.125 mg total) by mouth 2 (two) times daily with a meal.   diclofenac Sodium 1 % Gel Commonly known as: VOLTAREN Apply 2 g topically 4 (four) times daily. To left wrist   Glucagon Emergency 1 MG Kit Inject 1 mg into the vein as needed (CBG of 56m/dL).   insulin aspart 100 UNIT/ML injection Commonly known as: novoLOG Inject 0-6 Units into the skin 3 (three) times daily with meals. CBG < 70: Implement Hypoglycemia Standing Orders and refer to Hypoglycemia Standing Orders sidebar report CBG 70 - 120: 0 units CBG 121 - 150:  0 units CBG 151 - 200: 1 unit CBG 201-250: 2 units CBG 251-300: 3 units CBG 301-350: 4 units CBG 351-400: 5 units CBG > 400: Give 6 units and call MD   insulin glargine 100 UNIT/ML Solostar Pen Commonly known as: LANTUS Inject 5 Units into the skin at bedtime. What changed:  how much to take when to take this   levETIRAcetam 750 MG tablet Commonly known as: KEPPRA Take 1 tablet (750 mg total) by mouth at bedtime. What changed:  medication strength See the new instructions.   levETIRAcetam 250 MG tablet Commonly known as: KEPPRA Take 1.5 tablets (375 mg total) by mouth every Monday, Wednesday, and Friday at 6 PM. What changed: You were already taking a medication with the same name, and this prescription was added. Make sure you understand how and when to take each.   melatonin 5 MG Tabs Take 5 mg by mouth at bedtime.   methylphenidate 5 MG tablet Commonly known as: RITALIN Take 1 tablet (5 mg total) by mouth 2 (two) times daily  with breakfast and lunch. Due to stroke attention What changed: additional instructions   midodrine 10 MG tablet Commonly known as: PROAMATINE Take 1 tablet (10 mg total) by mouth every Monday, Wednesday, and Friday with hemodialysis.   mometasone-formoterol 200-5 MCG/ACT Aero Commonly known as: DULERA Inhale 2 puffs into the lungs 2 (two) times daily.   ondansetron 4 MG disintegrating tablet Commonly known as: ZOFRAN-ODT Take 1 tablet (4 mg total) by mouth every 8 (eight) hours as needed for nausea or vomiting.   pantoprazole 40 MG tablet Commonly known as: Protonix Take 1 tablet (40 mg total) by mouth daily. What changed: when to take this   polyethylene glycol 17 g packet Commonly known as: MIRALAX / GLYCOLAX Take 17 g by mouth daily as needed for moderate constipation.   Pro-Stat Liqd Take 30 mLs by mouth 2 (two) times daily.   rosuvastatin 20 MG tablet Commonly known as: CRESTOR Take 1 tablet (20 mg total) by mouth daily. What  changed: when to take this   senna-docusate 8.6-50 MG tablet Commonly known as: Senokot-S Take 2 tablets by mouth 2 (two) times daily.   sevelamer carbonate 0.8 g Pack packet Commonly known as: RENVELA Take 0.8 g by mouth 3 (three) times daily.   thiamine 100 MG tablet Commonly known as: Vitamin B-1 Take 1 tablet (100 mg total) by mouth daily.   ticagrelor 90 MG Tabs tablet Commonly known as: BRILINTA Take 1 tablet (90 mg total) by mouth 2 (two) times daily.               Discharge Care Instructions  (From admission, onward)           Start     Ordered   06/29/22 0000  Discharge wound care:       Comments: Local stump care.   06/29/22 1316            Contact information for after-discharge care     Destination     [facility] Preferred SNF .   Service: Skilled Nursing Contact information: [address] [address] [phone]                    Subjective Today, patient was seen and examined at bedside during hemodialysis.  Hard of hearing.  Denies any vomiting fever chills or abdominal pain but has some nausea.  Denies overt pain.  Discharge Exam: Filed Weights   06/28/22 0438 06/29/22 0500 06/29/22 1131  Weight: 81.9 kg 81.9 kg 76.9 kg  Body mass index is 28.21 kg/m.    06/29/2022   11:31 AM 06/29/2022   11:00 AM 06/29/2022   10:30 AM  Vitals with BMI  Weight 169 lbs 9 oz    Systolic 681 157 262  Diastolic 74 70 035  Pulse 70 94 77    General:  Average built, not in obvious distress HENT:   No scleral pallor or icterus noted. Oral mucosa is moist.  Chest:  Clear breath sounds.  Diminished breath sounds bilaterally. No crackles or wheezes.  CVS: S1 &S2 heard. No murmur.  Regular rate and rhythm. Abdomen: Soft, nontender, nondistended.  Bowel sounds are heard.   Extremities: No cyanosis, clubbing or edema.  Peripheral pulses are palpable. Psych: Alert, awake and oriented, normal  mood CNS:  No cranial nerve deficits.  Power equal in all extremities.   Skin: Warm and dry.  No rashes noted.  Condition at discharge: fair  The results of  significant diagnostics from this hospitalization (including imaging, microbiology, ancillary and laboratory) are listed below for reference.   Imaging Studies: Overnight EEG with video  Result Date: 06/23/2022 [name], MD     06/23/2022 12:50 PM Patient Name: TIJANA WALDER MRN: [id number] Epilepsy Attending: [name] Referring Physician/Provider: Greta Doom, MD Duration: 06/22/2022 1347 to 06/23/2022 2122  Patient history: 71 year old female with acute unresponsiveness of unclear etiology.  EEG to evaluate for seizure.  Level of alertness: awake, asleep AEDs during EEG study: LEV  Technical aspects: This EEG study was done with scalp electrodes positioned according to the 10-20 International system of electrode placement. Electrical activity was reviewed with band pass filter of 1-'70Hz' , sensitivity of 7 uV/mm, display speed of 35m/sec with a '60Hz'  notched filter applied as appropriate. EEG data were recorded continuously and digitally stored.  Video monitoring was available and reviewed as appropriate.  Description: The posterior dominant rhythm consists of 8-9 Hz activity of moderate voltage (25-35 uV) seen predominantly in posterior head regions, symmetric and reactive to eye opening and eye closing.  Sleep was characterized by vertex waves, sleep spindles (12 to 14 Hz), maximal central region.  EEG showed intermittent generalized and maximal right temporal parietal region 3 to 5 Hz theta-delta slowing.  Spikes were noted in right temporoparietal region which at times appeared quasiperiodic at 1 to 1.5 Hz, predominantly when awake/stimulated.  Hyperventilation and photic stimulation were not performed.    ABNORMALITY - Spike, right temporoparietal region - Intermittent slow, generalized and maximal right temporoparietal  region  IMPRESSION: This study showed evidence of epileptogenicity and cortical dysfunction in right temporoparietal region. Additionally there is mild diffuse encephalopathy, nonspecific etiology.  No seizures  were seen throughout the recording.  PLora Havens   MR BRAIN WO CONTRAST  Result Date: 06/22/2022 CLINICAL DATA:  Mental status change with unknown cause EXAM: MRI HEAD WITHOUT CONTRAST TECHNIQUE: Multiplanar, multiecho pulse sequences of the brain and surrounding structures were obtained without intravenous contrast. COMPARISON:  Head CT and CTA from yesterday FINDINGS: Brain: Fading of the now subacute infarct in the left corona radiata on diffusion imaging. Even more remote right parietal cortex infarct which is no longer apparent on diffusion imaging. Remote lacunar infarct at the right basal ganglia and corona radiata with wallerian degeneration descending the right brainstem. Confluent white matter gliosis greatest in the frontal lobes. Generalized brain atrophy. No acute infarct, hemorrhage, hydrocephalus, or collection. Vascular: Major flow voids are preserved Skull and upper cervical spine: No focal marrow lesion. Sinuses/Orbits: Bilateral maxillary sinus effusions in the setting of intubation. IMPRESSION: 1. No acute finding. 2. Atrophy and chronic small vessel disease. Electronically Signed   By: JJorje GuildM.D.   On: 06/22/2022 11:35   EEG adult  Result Date: 06/22/2022 YLora Havens MD     06/22/2022 10:33 AM Patient Name: VTHEADORA NOYESMRN: [id number] Attending: PLora HavensReferring Physician/Provider: KGreta Doom MD Date: 06/22/2022 Duration: 26.55 mins Patient history: 71year old female with acute unresponsiveness of unclear etiology.  EEG to evaluate for seizure. Level of alertness:  lethargic AEDs during EEG study: None Technical aspects: This EEG study was done with scalp electrodes positioned according to the 10-20 International system of  electrode placement. Electrical activity was reviewed with band pass filter of 1-'70Hz' , sensitivity of 7 uV/mm, display speed of 345msec with a '60Hz'  notched filter applied as appropriate. EEG data were recorded continuously and digitally stored.  Video monitoring was available and  reviewed as appropriate. Description: The posterior dominant rhythm consists of 8-9 Hz activity of moderate voltage (25-35 uV) seen predominantly in posterior head regions, symmetric and reactive to eye opening and eye closing.  EEG showed intermittent generalized and maximal right temporal parietal region 3 to 5 Hz theta-delta slowing.  Frequent spikes were noted in right temporoparietal region.  Hyperventilation and photic stimulation were not performed.   ABNORMALITY - Spike, right temporoparietal region - Intermittent slow, generalized and maximal right temporoparietal region IMPRESSION: This study showed evidence of epileptogenicity and cortical dysfunction in right temporoparietal region.  Additionally there is mild diffuse encephalopathy, nonspecific etiology.  No seizures  were seen throughout the recording. [name]   CT ANGIO HEAD NECK W WO CM  Result Date: 06/22/2022 CLINICAL DATA:  Neuro deficit, stroke suspected EXAM: CT ANGIOGRAPHY HEAD AND NECK TECHNIQUE: Multidetector CT imaging of the head and neck was performed using the standard protocol during bolus administration of intravenous contrast. Multiplanar CT image reconstructions and MIPs were obtained to evaluate the vascular anatomy. Carotid stenosis measurements (when applicable) are obtained utilizing NASCET criteria, using the distal internal carotid diameter as the denominator. RADIATION DOSE REDUCTION: This exam was performed according to the departmental dose-optimization program which includes automated exposure control, adjustment of the mA and/or kV according to patient size and/or use of iterative reconstruction technique. CONTRAST:  24m OMNIPAQUE  IOHEXOL 350 MG/ML SOLN COMPARISON:  Same-day noncontrast CT, MR and MRA head 06/04/2022, CTA head and neck 05/05/2021 FINDINGS: CTA NECK FINDINGS Aortic arch: The imaged aortic arch is normal. The origins of the major branch vessels are patent. The subclavian arteries are patent to the level imaged. Right carotid system: The right common, internal, and external carotid arteries are patent with minimal plaque of the bifurcation but without hemodynamically significant stenosis or occlusion. There is no dissection or aneurysm. Left carotid system: The left common, internal, and external carotid arteries are patent, without hemodynamically significant stenosis or occlusion. There is no dissection or aneurysm. Vertebral arteries: The vertebral arteries are patent, without hemodynamically significant stenosis or occlusion. There is no dissection or aneurysm. Skeleton: There is no acute osseous abnormality or suspicious osseous lesion. There is no visible canal hematoma. Other neck: Endotracheal and enteric tubes are noted, incompletely imaged. Fluid in the imaged airway and layering in the sinuses is likely related to intubation. The soft tissues of the neck are unremarkable. Upper chest: The imaged lung apices are clear. Review of the MIP images confirms the above findings CTA HEAD FINDINGS Anterior circulation: There is calcified plaque in the carotid siphons without hemodynamically significant stenosis or occlusion. The bilateral MCAs are patent with mild atherosclerotic irregularity and narrowing but no proximal high-grade stenosis or occlusion. The bilateral ACAs are patent without proximal stenosis or occlusion. There is no aneurysm or AVM. Posterior circulation: The bilateral V4 segments are patent. The basilar artery is patent. The major cerebellar artery origins are patent The bilateral PCAs are patent. There is short-segment moderate stenosis of the left P2 segment (8-134, 12-25). Prominent bilateral posterior  communicating arteries are patent. There is no aneurysm or AVM. Venous sinuses: Patent. Anatomic variants: None. Review of the MIP images confirms the above findings IMPRESSION: 1. No emergent large vessel occlusion. 2. Short-segment moderate stenosis of the left P2 segment. Otherwise mild intracranial atherosclerosis with no other significant stenosis. 3. Patent vasculature of the neck without hemodynamically significant stenosis, occlusion, or dissection. Electronically Signed   By: PValetta MoleM.D.   On: 06/22/2022 08:52  CT HEAD WO CONTRAST (5MM)  Result Date: 06/22/2022 CLINICAL DATA:  Delirium EXAM: CT HEAD WITHOUT CONTRAST TECHNIQUE: Contiguous axial images were obtained from the base of the skull through the vertex without intravenous contrast. RADIATION DOSE REDUCTION: This exam was performed according to the departmental dose-optimization program which includes automated exposure control, adjustment of the mA and/or kV according to patient size and/or use of iterative reconstruction technique. COMPARISON:  Brain MRI 06/03/2022 FINDINGS: Brain: No evidence of acute infarction, hemorrhage, hydrocephalus, extra-axial collection or mass lesion/mass effect. Chronic small vessel ischemia. White matter gliosis is greatest in the frontal lobes. Remote right basal ganglia infarct with wallerian degeneration seen into the right pons based on MRI. Cerebral volume loss. Vascular: No hyperdense vessel or unexpected calcification. Skull: Normal. Negative for fracture or focal lesion. Sinuses/Orbits: No acute finding. IMPRESSION: No acute or reversible finding.  Stable from recent brain MRI. Electronically Signed   By: Jorje Guild M.D.   On: 06/22/2022 07:20   DG Chest Port 1 View  Result Date: 06/22/2022 CLINICAL DATA:  Intubation EXAM: PORTABLE CHEST 1 VIEW COMPARISON:  06/11/2022 FINDINGS: Endotracheal tube with tip 12 mm above the carina. The enteric tube reaches the stomach. Right Perma catheter with  tip at the right atrium. Extensive artifact from EKG leads. Low volume chest with hazy density likely reflecting atelectasis. No visible effusion or pneumothorax. Cardiomegaly. IMPRESSION: 1. Endotracheal tube with tip 12 mm above the carina. 2. The enteric tube reaches the stomach. 3. Cardiomegaly and low lung volumes. Electronically Signed   By: Jorje Guild M.D.   On: 06/22/2022 07:16   DG Chest 2 View  Result Date: 06/11/2022 CLINICAL DATA:  Chest pain and shortness of breath. EXAM: CHEST - 2 VIEW COMPARISON:  June 03, 2022 FINDINGS: There is stable right-sided venous catheter positioning. The cardiac silhouette is mildly enlarged and unchanged in size. Mild linear atelectasis is seen within the left lung base. There is no evidence of a pleural effusion or pneumothorax. There is mild dextroscoliosis of the mid to lower thoracic spine. IMPRESSION: Mild left basilar linear atelectasis. Electronically Signed   By: Virgina Norfolk M.D.   On: 06/11/2022 01:06   ECHOCARDIOGRAM COMPLETE  Result Date: 06/04/2022    ECHOCARDIOGRAM REPORT   Patient Name:   SHIVANGI LUTZ [facility] Date of Exam: 06/04/2022 Medical Rec #:  [id number]      Height:       65.0 in Accession #:    [id number]     Weight:       173.9 lb Date of Birth:  [birth year]      BSA:          1.864 m Patient Age:    57 years       BP:           150/62 mmHg Patient Gender: F              HR:           72 bpm. Exam Location:  Inpatient Procedure: 2D Echo Indications:     stroke  History:         Patient has prior history of Echocardiogram examinations, most                  recent 03/11/2021. CAD, end stage renal disease,                  Signs/Symptoms:Shortness of Breath; Risk Factors:Hypertension,  Dyslipidemia and Diabetes.  Sonographer:     Johny Chess RDCS Referring Phys:  [place] Diagnosing Phys: Eleonore Chiquito MD  Sonographer Comments: Image acquisition challenging due to uncooperative patient. IMPRESSIONS  1.  Left ventricular ejection fraction, by estimation, is 60 to 65%. The left ventricle has normal function. The left ventricle has no regional wall motion abnormalities. Left ventricular diastolic parameters are consistent with Grade I diastolic dysfunction (impaired relaxation).  2. Right ventricular systolic function is normal. The right ventricular size is normal. There is mildly elevated pulmonary artery systolic pressure. The estimated right ventricular systolic pressure is 86.7 mmHg.  3. The mitral valve is grossly normal. No evidence of mitral valve regurgitation. No evidence of mitral stenosis.  4. The aortic valve is tricuspid. There is mild calcification of the aortic valve. There is mild thickening of the aortic valve. Aortic valve regurgitation is not visualized. Aortic valve sclerosis is present, with no evidence of aortic valve stenosis.  5. The inferior vena cava is dilated in size with >50% respiratory variability, suggesting right atrial pressure of 8 mmHg. Conclusion(s)/Recommendation(s): No intracardiac source of embolism detected on this transthoracic study. Consider a transesophageal echocardiogram to exclude cardiac source of embolism if clinically indicated. FINDINGS  Left Ventricle: Left ventricular ejection fraction, by estimation, is 60 to 65%. The left ventricle has normal function. The left ventricle has no regional wall motion abnormalities. The left ventricular internal cavity size was normal in size. There is  no left ventricular hypertrophy. Left ventricular diastolic parameters are consistent with Grade I diastolic dysfunction (impaired relaxation). Right Ventricle: The right ventricular size is normal. No increase in right ventricular wall thickness. Right ventricular systolic function is normal. There is mildly elevated pulmonary artery systolic pressure. The tricuspid regurgitant velocity is 2.80  m/s, and with an assumed right atrial pressure of 8 mmHg, the estimated right  ventricular systolic pressure is 54.4 mmHg. Left Atrium: Left atrial size was normal in size. Right Atrium: Right atrial size was normal in size. Pericardium: Trivial pericardial effusion is present. Mitral Valve: The mitral valve is grossly normal. No evidence of mitral valve regurgitation. No evidence of mitral valve stenosis. Tricuspid Valve: The tricuspid valve is grossly normal. Tricuspid valve regurgitation is trivial. No evidence of tricuspid stenosis. Aortic Valve: The aortic valve is tricuspid. There is mild calcification of the aortic valve. There is mild thickening of the aortic valve. Aortic valve regurgitation is not visualized. Aortic valve sclerosis is present, with no evidence of aortic valve stenosis. Pulmonic Valve: The pulmonic valve was grossly normal. Pulmonic valve regurgitation is not visualized. No evidence of pulmonic stenosis. Aorta: The aortic root and ascending aorta are structurally normal, with no evidence of dilitation. Venous: The inferior vena cava is dilated in size with greater than 50% respiratory variability, suggesting right atrial pressure of 8 mmHg. IAS/Shunts: The atrial septum is grossly normal.  LEFT VENTRICLE PLAX 2D LVIDd:         4.00 cm   Diastology LVIDs:         2.30 cm   LV e' medial:    11.30 cm/s LV PW:         0.90 cm   LV E/e' medial:  8.4 LV IVS:        1.00 cm   LV e' lateral:   9.68 cm/s LVOT diam:     1.90 cm   LV E/e' lateral: 9.8 LV SV:         65 LV  SV Index:   35 LVOT Area:     2.84 cm  RIGHT VENTRICLE             IVC RV S prime:     12.80 cm/s  IVC diam: 2.10 cm LEFT ATRIUM             Index        RIGHT ATRIUM           Index LA diam:        3.70 cm 1.98 cm/m   RA Area:     15.20 cm LA Vol (A2C):   28.3 ml 15.18 ml/m  RA Volume:   39.40 ml  21.14 ml/m LA Vol (A4C):   37.3 ml 20.01 ml/m LA Biplane Vol: 34.2 ml 18.35 ml/m  AORTIC VALVE LVOT Vmax:   117.00 cm/s LVOT Vmean:  77.800 cm/s LVOT VTI:    0.229 m  AORTA Ao Root diam: 2.70 cm Ao Asc diam:   2.90 cm MITRAL VALVE                TRICUSPID VALVE MV Area (PHT): 3.60 cm     TR Peak grad:   31.4 mmHg MV Decel Time: 211 msec     TR Vmax:        280.00 cm/s MV E velocity: 94.70 cm/s MV A velocity: 122.00 cm/s  SHUNTS MV E/A ratio:  0.78         Systemic VTI:  0.23 m                             Systemic Diam: 1.90 cm Eleonore Chiquito MD Electronically signed by Eleonore Chiquito MD Signature Date/Time: 06/04/2022/4:23:53 PM    Final (Updated)    VAS US CAROTID  Result Date: 06/04/2022 Carotid Arterial Duplex Study Patient Name:  AZARIA STEGMAN [facility]  Date of Exam:   06/04/2022 Medical Rec #: [id number]       Accession #:    [id number] Date of Birth: [birth year]       Patient Gender: F Patient Age:   1 years Exam Location:  [facility] Procedure:      VAS US CAROTID Referring Phys: A POWELL JR --------------------------------------------------------------------------------  Indications:       CVA. Risk Factors:      Hypertension, hyperlipidemia, Diabetes, coronary artery                    disease, prior CVA. Limitations        Today's exam was limited due to the patient's inability or                    unwillingness to cooperate. Comparison Study:  No previous exam noted. Performing Technologist: Bobetta Lime BS, RVT  Examination Guidelines: A complete evaluation includes B-mode imaging, spectral Doppler, color Doppler, and power Doppler as needed of all accessible portions of each vessel. Bilateral testing is considered an integral part of a complete examination. Limited examinations for reoccurring indications may be performed as noted.  Right Carotid Findings: +----------+--------+--------+--------+------------------+--------+           PSV cm/sEDV cm/sStenosisPlaque DescriptionComments +----------+--------+--------+--------+------------------+--------+ CCA Prox  115     0                                          +----------+--------+--------+--------+------------------+--------+  CCA Distal91       14                                         +----------+--------+--------+--------+------------------+--------+ ICA Prox  133     18              heterogenous               +----------+--------+--------+--------+------------------+--------+ ICA Distal130     23                                         +----------+--------+--------+--------+------------------+--------+ ECA       174     0                                          +----------+--------+--------+--------+------------------+--------+ +----------+--------+-------+---------+-------------------+           PSV cm/sEDV cmsDescribe Arm Pressure (mmHG) +----------+--------+-------+---------+-------------------+ Subclavian209            Turbulent                    +----------+--------+-------+---------+-------------------+ +---------+--------+--+--------+--+---------+ VertebralPSV cm/s85EDV cm/s13Antegrade +---------+--------+--+--------+--+---------+  Left Carotid Findings: +----------+--------+--------+--------+------------------+--------+           PSV cm/sEDV cm/sStenosisPlaque DescriptionComments +----------+--------+--------+--------+------------------+--------+ CCA Prox  144     10                                         +----------+--------+--------+--------+------------------+--------+ CCA Distal136     14                                         +----------+--------+--------+--------+------------------+--------+ ICA Prox  108     13              heterogenous               +----------+--------+--------+--------+------------------+--------+ ICA Distal157     23                                         +----------+--------+--------+--------+------------------+--------+ ECA       148     0                                          +----------+--------+--------+--------+------------------+--------+ +----------+--------+--------+----------------+-------------------+            PSV cm/sEDV cm/sDescribe        Arm Pressure (mmHG) +----------+--------+--------+----------------+-------------------+ [id number]             Multiphasic, WNL                    +----------+--------+--------+----------------+-------------------+ +---------+--------+---+--------+--+---------+ VertebralPSV cm/s113EDV cm/s13Antegrade +---------+--------+---+--------+--+---------+   Summary: Right Carotid: Velocities in the right ICA are consistent with a 1-39% stenosis. Left Carotid: Velocities in the left ICA are consistent with a 1-39%  stenosis. Vertebrals:  Bilateral vertebral arteries demonstrate antegrade flow. Subclavians: Right subclavian artery flow was disturbed. Normal flow              hemodynamics were seen in the left subclavian artery. *See table(s) above for measurements and observations.  Electronically signed by Antony Contras MD on 06/04/2022 at 11:53:07 AM.    Final    MR ANGIO HEAD WO CONTRAST  Result Date: 06/04/2022 CLINICAL DATA:  Follow-up examination for stroke. EXAM: MRA HEAD WITHOUT CONTRAST TECHNIQUE: Angiographic images of the Circle of Willis were acquired using MRA technique without intravenous contrast. COMPARISON:  Comparison made with previous brain MRI from earlier the same day as well as prior CTA from 05/05/2021. FINDINGS: Anterior circulation: Examination severely degraded by motion artifact, limiting assessment. Visualized distal cervical segments of the internal carotid arteries are patent with antegrade flow. Both internal carotid arteries remain patent through the siphons without obvious stenosis. A1 segments patent. Right A1 hypoplastic. Grossly normal anterior communicating artery complex. Both ACAs grossly patent to their distal aspects on time-of-flight sequence without appreciable stenosis. M1 segments patent without obvious stenosis or occlusion. No visible or obvious proximal MCA branch occlusion. Distal MCA branches grossly perfused and symmetric  on time-of-flight sequence. Posterior circulation: Vertebral arteries are code dominant and patent without visible stenosis. Both PICA appear to be grossly patent at their origins. Basilar patent to its distal aspect without appreciable stenosis. Superior cerebellar arteries grossly patent at their origins. Both PCA supplied via the basilar as well as prominent bilateral posterior communicating arteries. Both PCAs appear grossly perfused and patent to their distal aspects on time-of-flight sequence. No visible stenosis. Anatomic variants: As above. Other: Evaluation for aneurysm extremely limited on this exam. IMPRESSION: 1. Severely motion degraded exam. 2. Grossly negative intracranial MRA for large vessel occlusion. No obvious hemodynamically significant or correctable stenosis. Electronically Signed   By: Jeannine Boga M.D.   On: 06/04/2022 05:11   DG CHEST PORT 1 VIEW  Result Date: 06/03/2022 CLINICAL DATA:  Shortness of breath. EXAM: PORTABLE CHEST 1 VIEW COMPARISON:  Chest x-ray 05/30/2022. FINDINGS: Mild streaky left basilar opacities. No confluent consolidation. No visible pleural effusions or pneumothorax. Similar cardiomediastinal silhouette, which is mildly enlarged. Right IJ approach central venous catheter with the tip projecting at the right atrium. IMPRESSION: Mild streaky left basilar opacities, favor atelectasis. Electronically Signed   By: Margaretha Sheffield M.D.   On: 06/03/2022 14:15   MR BRAIN WO CONTRAST  Result Date: 06/03/2022 CLINICAL DATA:  Confusion.  History of end-stage renal disease EXAM: MRI HEAD WITHOUT CONTRAST TECHNIQUE: Multiplanar, multiecho pulse sequences of the brain and surrounding structures were obtained without intravenous contrast. COMPARISON:  Head CT from 3 days ago. FINDINGS: Brain: Punctate focus of acute ischemia in the left corona radiata. Small acute cortical infarct in the right parietal lobe. Remote perforator infarct at the right corona radiata  with wallerian degeneration seen descending the brainstem. Confluent ischemic gliosis in the cerebral white matter. No acute hemorrhage, hydrocephalus, or collection. Vascular: Preserved flow voids Skull and upper cervical spine: No focal marrow lesion Sinuses/Orbits: Bilateral cataract resection.  No acute finding Other: Intermittently pronounced motion artifact IMPRESSION: 1. 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 2. Advanced chronic small vessel ischemia. 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 3. Motion degraded. Electronically Signed   By: Jorje Guild M.D.   On: 06/03/2022 03:58   CT HEAD WO CONTRAST (5MM)  Result Date: 05/31/2022 CLINICAL DATA:  Mental status changes EXAM: CT HEAD WITHOUT CONTRAST TECHNIQUE: Contiguous axial images were obtained from the base of the skull through the vertex without intravenous contrast. RADIATION DOSE REDUCTION: This exam was performed according to the departmental dose-optimization program which includes automated exposure control, adjustment of the mA and/or kV according to patient size and/or use of iterative reconstruction technique. COMPARISON:  Prior head CT 05/19/2022 FINDINGS: Brain: No evidence of acute infarction, hemorrhage, hydrocephalus, extra-axial collection or mass lesion/mass effect. Extensive periventricular white matter hypoattenuation similar compared to prior imaging consistent with advanced chronic microvascular ischemic white matter disease. Vascular: No hyperdense vessel or unexpected calcification. Skull: Normal. Negative for fracture or focal lesion. Sinuses/Orbits: No acute finding. Other: None. IMPRESSION: 1. No acute intracranial process. 2. Similar appearance of advanced chronic microvascular ischemic white matter disease. Electronically Signed   By: Jacqulynn Cadet M.D.   On: 05/31/2022 13:01   EEG adult  Result Date: 05/31/2022 [name], MD     05/31/2022 12:20 PM Patient Name: OTTO CARAWAY MRN: [id number] Epilepsy Attending: [name] Referring Physician/Provider: Mariel Aloe, MD Date: 05/31/2022 Duration: 25.32 mins Patient history: 71 year old female with altered mental status.  EEG to evaluate for seizure. Level of alertness: Awake, asleep AEDs during EEG study: LEV Technical aspects: This EEG study was done with scalp electrodes positioned according to the 10-20 International system of electrode placement. Electrical activity was reviewed with band pass filter of 1-'70Hz' , sensitivity of 7 uV/mm, display speed of 31m/sec with a '60Hz'  notched filter applied as appropriate. EEG data were recorded continuously and digitally stored.  Video monitoring was available and reviewed as appropriate. Description: During awake state, no clear posterior dominant rhythm was seen.  Sleep was characterized by sleep spindles (12 to 14 Hz), maximal frontocentral region.  EEG showed continuous generalized 3 to 5 Hz theta-delta slowing.  Physiologic photic driving was not seen during photic stimulation.  Hyperventilation was not performed.   ABNORMALITY - Continuous slow, generalized IMPRESSION: This study is suggestive of moderate diffuse encephalopathy, nonspecific etiology.  No seizures or epileptiform discharges were seen throughout the recording. PLora Havens   Microbiology: Results for orders placed or performed during the hospital encounter of 06/22/22  Culture, blood (Routine X 2) w Reflex to ID Panel     Status: None   Collection Time: 06/22/22  6:38 AM   Specimen: BLOOD  Result Value Ref Range Status   Specimen Description BLOOD RIGHT ANTECUBITAL  Final   Special Requests   Final    BOTTLES DRAWN AEROBIC AND ANAEROBIC Blood Culture adequate volume   Culture   Final    NO GROWTH 5 DAYS Performed at [facility] [address], [place] [id number]   Report Status 06/27/2022 FINAL  Final  Urine Culture      Status: Abnormal   Collection Time: 06/22/22  7:14 AM   Specimen: Urine, Clean Catch  Result Value Ref Range Status   Specimen Description URINE, CLEAN CATCH  Final   Special Requests   Final    NONE Performed at [facility] [address] [place] [address]   Culture MULTIPLE SPECIES PRESENT, SUGGEST RECOLLECTION (A)  Final   Report Status 06/23/2022 FINAL  Final  MRSA Next Gen by PCR, Nasal     Status: None   Collection Time: 06/22/22 11:45 AM   Specimen: Nasal Swab  Result Value Ref Range Status  MRSA by PCR Next Gen NOT DETECTED NOT DETECTED Final    Comment: (NOTE) The GeneXpert MRSA Assay (FDA approved for NASAL specimens only), is one component of a comprehensive MRSA colonization surveillance program. It is not intended to diagnose MRSA infection nor to guide or monitor treatment for MRSA infections. Test performance is not FDA approved in patients less than 32 years old. Performed at [facility], [place] [address] [place], [place] [address]   Culture, blood (Routine X 2) w Reflex to ID Panel     Status: None   Collection Time: 06/22/22  1:32 PM   Specimen: BLOOD RIGHT HAND  Result Value Ref Range Status   Specimen Description BLOOD RIGHT HAND  Final   Special Requests   Final    AEROBIC BOTTLE ONLY Blood Culture results may not be optimal due to an inadequate volume of blood received in culture bottles   Culture   Final    NO GROWTH 5 DAYS Performed at [facility], [place] [address] [place], [place] [address]    Report Status 06/27/2022 FINAL  Final    Labs: CBC: Recent Labs  Lab 06/24/22 0604 06/25/22 0751 06/27/22 0342 06/29/22 0720  WBC 12.9* 10.9* 13.7* 13.4*  HGB 10.2* 10.2* 9.7* 9.7*  HCT 30.2* 30.2* 27.8* 27.9*  MCV 94.7 93.8 92.7 93.3  PLT 337 322 364 466   Basic Metabolic Panel: Recent Labs  Lab 06/25/22 0751 06/27/22 0342 06/29/22 0720  NA 135 137 138  K 3.9 4.1 4.5  CL 98 101 101  CO2 '27 24 26  ' GLUCOSE  243* 312* 135*  BUN 38* 40* 30*  CREATININE 7.34* 6.71* 6.00*  CALCIUM 8.7* 9.1 9.3  PHOS 6.1* 5.6* 4.9*   Liver Function Tests: Recent Labs  Lab 06/25/22 0751 06/27/22 0342 06/29/22 0720  ALBUMIN 2.7* 2.9* 2.8*   CBG: Recent Labs  Lab 06/27/22 1604 06/27/22 2046 06/28/22 1214 06/28/22 1624 06/29/22 1320  GLUCAP 258* 138* 210* 266* 151*    Discharge time spent: greater than 30 minutes.  Signed: Flora Lipps, MD [facility] 06/29/2022

## 2022-06-29 NOTE — Progress Notes (Signed)
D/C order noted. Contacted [name] to advise clinic of pt's d/c today to snf and that pt will resume care on Friday.   Melven Sartorius Renal Navigator [phone]

## 2022-06-29 NOTE — Progress Notes (Signed)
[facility] Progress Note   Subjective:  Seen on HD - 3L UFG and tolerating. No CP/dyspnea or leg pain at the moment.  Objective Vitals:   06/29/22 0513 06/29/22 0700 06/29/22 0724 06/29/22 0800  BP: (!) 168/63 (!) 168/98 (!) 168/91 (!) 178/70  Pulse: 76 75 74 72  Resp:  (!) '21 19 16  '$ Temp:  98.6 F (37 C)    TempSrc:  Oral    SpO2:      Weight:       Physical Exam General: Elderly woman, NAD. Room air. Heart: RRR; 2/6 murmur Lungs: CTAB; no rales Abdomen: soft Extremities: No LE edema Dialysis Access: TDC in R chest  Additional Objective Labs: Basic Metabolic Panel: Recent Labs  Lab 06/22/22 0919 06/25/22 0751 06/27/22 0342  NA 135 135 137  K 3.8 3.9 4.1  CL 98 98 101  CO2 '24 27 24  '$ GLUCOSE 139* 243* 312*  BUN 30* 38* 40*  CREATININE 7.35* 7.34* 6.71*  CALCIUM 9.5 8.7* 9.1  PHOS  --  6.1* 5.6*   Liver Function Tests: Recent Labs  Lab 06/22/22 0919 06/25/22 0751 06/27/22 0342  AST 21  --   --   ALT 21  --   --   ALKPHOS 68  --   --   BILITOT 0.6  --   --   PROT 6.3*  --   --   ALBUMIN 2.8* 2.7* 2.9*   CBC: Recent Labs  Lab 06/24/22 0604 06/25/22 0751 06/27/22 0342  WBC 12.9* 10.9* 13.7*  HGB 10.2* 10.2* 9.7*  HCT 30.2* 30.2* 27.8*  MCV 94.7 93.8 92.7  PLT 337 322 364   Medications:   (feeding supplement) PROSource Plus  30 mL Oral BID BM   aspirin  81 mg Oral QHS   carvedilol  3.125 mg Oral BID WC   Chlorhexidine Gluconate Cloth  6 each Topical Q0600   darbepoetin (ARANESP) injection - DIALYSIS  60 mcg Intravenous Q Wed-HD   diclofenac Sodium  2 g Topical QID   heparin injection (subcutaneous)  5,000 Units Subcutaneous Q8H   insulin aspart  0-6 Units Subcutaneous TID WC   levETIRAcetam  750 mg Oral Daily   levETIRAcetam  375 mg Oral Q M,W,F-1800   methylphenidate  5 mg Oral BID WC   midodrine  10 mg Oral Q M,W,F-HD   sevelamer carbonate  0.8 g Oral TID with meals   ticagrelor  90 mg Oral BID    Dialysis  Orders: MWF - [place] 4 hrs 180NRE 350/700 79 kg 2.0 K/2.0 Ca UFP 4 No heparin - Mircera 200 mcg  IV q 2wks - last 04/29/22 as outpt, unclear if she got last admission - Hectorol 58mg IV TIW   Assessment/Plan: Unresponsiveness/?seizure: Presented unresponsive. Intubated for airway protection but then came around and extubated without issues. Head CT was negative. EEG was concerning for seizure - neuro has adjusted her medications. 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 Pain control ok at this time per patient. ESRD: Continue HD on usual MWF schedule - HD today, 3K bath, 3L UFG. Anemia of ESRD: Hgb 9.7 - resuming Aranesp q Wed. Secondary hyperparathyroidism: CorrCa up slightly, VDRA on hold. Phos at goal. Continue binders.  HTN/volume: BP slightly high, keep EDW same for now. Ordered for mido pre-HD -- holding today unless she needs it. Nutrition: Alb low, continue supplements. T2DM: Per primary.  Hx CVA: On dual ASA/brilinta, and Keppra.  Per primary.  Veneta Penton, PA-C 06/29/2022, 8:23 AM  [employer]

## 2022-06-30 ENCOUNTER — Encounter (HOSPITAL_COMMUNITY): Payer: Self-pay | Admitting: Internal Medicine

## 2022-06-30 DIAGNOSIS — R41841 Cognitive communication deficit: Secondary | ICD-10-CM | POA: Diagnosis not present

## 2022-06-30 DIAGNOSIS — R2681 Unsteadiness on feet: Secondary | ICD-10-CM | POA: Diagnosis not present

## 2022-06-30 DIAGNOSIS — I69391 Dysphagia following cerebral infarction: Secondary | ICD-10-CM | POA: Diagnosis not present

## 2022-06-30 DIAGNOSIS — E162 Hypoglycemia, unspecified: Secondary | ICD-10-CM | POA: Diagnosis not present

## 2022-07-01 DIAGNOSIS — N2581 Secondary hyperparathyroidism of renal origin: Secondary | ICD-10-CM | POA: Diagnosis not present

## 2022-07-01 DIAGNOSIS — Z992 Dependence on renal dialysis: Secondary | ICD-10-CM | POA: Diagnosis not present

## 2022-07-01 DIAGNOSIS — N186 End stage renal disease: Secondary | ICD-10-CM | POA: Diagnosis not present

## 2022-07-03 DIAGNOSIS — E1165 Type 2 diabetes mellitus with hyperglycemia: Secondary | ICD-10-CM | POA: Diagnosis not present

## 2022-07-04 DIAGNOSIS — Z992 Dependence on renal dialysis: Secondary | ICD-10-CM | POA: Diagnosis not present

## 2022-07-04 DIAGNOSIS — N2581 Secondary hyperparathyroidism of renal origin: Secondary | ICD-10-CM | POA: Diagnosis not present

## 2022-07-04 DIAGNOSIS — N186 End stage renal disease: Secondary | ICD-10-CM | POA: Diagnosis not present

## 2022-07-06 DIAGNOSIS — N2581 Secondary hyperparathyroidism of renal origin: Secondary | ICD-10-CM | POA: Diagnosis not present

## 2022-07-06 DIAGNOSIS — N186 End stage renal disease: Secondary | ICD-10-CM | POA: Diagnosis not present

## 2022-07-06 DIAGNOSIS — Z992 Dependence on renal dialysis: Secondary | ICD-10-CM | POA: Diagnosis not present

## 2022-07-08 DIAGNOSIS — N2581 Secondary hyperparathyroidism of renal origin: Secondary | ICD-10-CM | POA: Diagnosis not present

## 2022-07-08 DIAGNOSIS — Z992 Dependence on renal dialysis: Secondary | ICD-10-CM | POA: Diagnosis not present

## 2022-07-08 DIAGNOSIS — N186 End stage renal disease: Secondary | ICD-10-CM | POA: Diagnosis not present

## 2022-07-11 DIAGNOSIS — N2581 Secondary hyperparathyroidism of renal origin: Secondary | ICD-10-CM | POA: Diagnosis not present

## 2022-07-11 DIAGNOSIS — Z992 Dependence on renal dialysis: Secondary | ICD-10-CM | POA: Diagnosis not present

## 2022-07-11 DIAGNOSIS — N186 End stage renal disease: Secondary | ICD-10-CM | POA: Diagnosis not present

## 2022-07-12 ENCOUNTER — Encounter: Payer: Self-pay | Admitting: Family

## 2022-07-12 ENCOUNTER — Ambulatory Visit (INDEPENDENT_AMBULATORY_CARE_PROVIDER_SITE_OTHER): Payer: Medicare PPO | Admitting: Family

## 2022-07-12 DIAGNOSIS — S88111A Complete traumatic amputation at level between knee and ankle, right lower leg, initial encounter: Secondary | ICD-10-CM

## 2022-07-12 DIAGNOSIS — Z89511 Acquired absence of right leg below knee: Secondary | ICD-10-CM

## 2022-07-12 NOTE — Progress Notes (Signed)
Post-Op Visit Note   Patient: Carolyn Russo           Date of Birth: 1951/06/27           MRN: [id number] Visit Date: 07/12/2022 PCP: [name], MD  Chief Complaint: No chief complaint on file.   HPI:  HPI The patient is a 71 year old woman seen status post below-knee amputation.  She is about 7 weeks out.  Ortho Exam On examination of the right residual limb this is consolidating well she does have sutures in place the incision is well approximated healing well there is 1 area of ulceration and centrally this is 3 cm in diameter filled in with 100% fibrinous exudative tissue this has no depth does not probe there is no active drainage  Visit Diagnoses: No diagnosis found.  Plan: Continue daily Dial soap cleansing.  Iodosorb dressing changes can begin shrinkers wear these around-the-clock once obtained.  She may remove the limb protector for sleep.  She will continue with her physical therapy rehab  Follow-Up Instructions: No follow-ups on file.   Imaging: No results found.  Orders:  No orders of the defined types were placed in this encounter.  No orders of the defined types were placed in this encounter.    PMFS History: Patient Active Problem List   Diagnosis Date Noted   Urinary tract infection without hematuria    Diabetes mellitus due to underlying condition with hypoglycemia without coma, with long-term current use of insulin (HCC)    Acute encephalopathy 06/22/2022   Acute respiratory failure ([place])    Unresponsiveness    Pressure ulcer 06/07/2022   Acute stroke due to ischemia ([place]) 06/03/2022   Chest pain 06/01/2022   History of CVA (cerebrovascular accident) 06/01/2022   Seizure ([place])    Encephalopathy    Below-knee amputation of right lower extremity ([place])    Leukocytosis    Foot infection 05/10/2022   PAD (peripheral artery disease) ([place]) 05/10/2022   Gangrene of right foot ([place]) 04/15/2022   Left hemiparesis ([place]) 09/24/2021   Abnormality of  gait 09/24/2021   Pain of right hand 08/06/2021   Steal syndrome of dialysis vascular access ([place]) 08/06/2021   Subclavian steal syndrome of right subclavian artery 06/25/2021   Right hand weakness 06/25/2021   Stroke-like symptoms 05/06/2021   Bandemia    Cerebrovascular accident (CVA) of right basal ganglia ([place]) 04/20/2021   External hemorrhoids    Basal ganglia infarction ([place]) 04/16/2021   ESRD on dialysis ([place]) 04/16/2021   Hypertensive urgency 03/10/2021   Hilar enlargement    Anemia of chronic disease    Chronic diastolic CHF (congestive heart failure) ([place]) 03/09/2021   CKD (chronic kidney disease), stage V ([place]) 03/08/2021   Diabetic retinopathy ([place]) 03/08/2021   Hyperglycemia due to type 2 diabetes mellitus ([place]) 03/08/2021   Pure hypercholesterolemia 03/08/2021   Anemia in chronic kidney disease 09/24/2020   Uncontrolled type 2 diabetes mellitus with hyperglycemia ([place]) 03/19/2019   Osteopenia 09/04/2018   Migraine 09/04/2018   Asthma 09/04/2018   Pseudophakia of both eyes 07/17/2018   Pseudophakia of left eye 07/03/2018   Open angle with borderline findings and high glaucoma risk in left eye 07/03/2018   Age-related nuclear cataract of right eye 07/03/2018   CAD (coronary artery disease), native coronary artery 04/27/2017   Abnormal nuclear stress test 04/04/2017   Shortness of breath 03/09/2017   Appendicitis    Age-related hypermature cataract of both eyes 12/20/2016   Uterine leiomyoma 11/27/2012  Dyslipidemia (high LDL; low HDL) 11/25/2011   Obesity (BMI 30-39.9) 11/25/2011   Hypertensive disorder 11/21/2011   Type 2 diabetes mellitus ([place]) 11/21/2011   Abnormal EKG 11/21/2011   Past Medical History:  Diagnosis Date   Acute GI bleeding    Allergy    Anemia    Anterior chest wall pain    Appendicitis 1965   Asthma    Body mass index 37.0-37.9, adult    Breast pain    Cataract    both eyes   CHF (congestive heart failure) ([place])    Chronic kidney  disease    stage 5 - on dialysis   Cognitive change 04/20/2021   r/t cva 05/2799   Complication of anesthesia    Memory loss after general   Dehydration 2014   Deviated septum 1971   Diabetes mellitus    Dysphagia due to old stroke    easy to get strangled when eating   Dyspnea 2014   Extrinsic asthma    WITH ASTHMA ATTACK   Fibroid 1980   GERD (gastroesophageal reflux disease)    Heart murmur    History of migraine    History of seizure    with stroke   Hx gestational diabetes    Hyperlipidemia    Hypertension 2014   Inguinal hernia 1959   Malaise and fatigue 2014   Non-IgE mediated allergic asthma 2014   Obesity    Pelvic pain    Pregnancy, high-risk 1985   Stroke ([place]) 04/20/2021   (CVA) of right basal ganglia   Tonsillitis 1968   Uterine fibroid 1980   Visual field defect    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    Family History  Problem Relation Age of Onset   Cancer Mother        abdominal melamona   Psoriasis Mother    Alzheimer's disease Father    Cancer Cousin        colon    Diabetes Cousin    Diabetes Maternal Aunt    Colon cancer Neg Hx    Colon polyps Neg Hx    Esophageal cancer Neg Hx    Rectal cancer Neg Hx    Stomach cancer Neg Hx    Breast cancer Neg Hx     Past Surgical History:  Procedure Laterality Date   ABDOMINAL AORTOGRAM W/LOWER EXTREMITY N/A 02/21/2022   Procedure: ABDOMINAL AORTOGRAM W/LOWER EXTREMITY;  Surgeon: Waynetta Sandy, MD;  Location: [facility];  Service: Cardiovascular;  Laterality: N/A;   AMPUTATION Right 05/18/2022   Procedure: RIGHT BELOW KNEE AMPUTATION;  Surgeon: Newt Minion, MD;  Location: [facility];  Service: Orthopedics;  Laterality: Right;   AMPUTATION TOE Right 04/15/2022   Procedure: AMPUTATION  LST TOERIGHT FOOT;  Surgeon: Criselda Peaches, DPM;  Location: [facility];  Service: Podiatry;  Laterality: Right;   APPENDECTOMY  3491   BASCILIC VEIN TRANSPOSITION Right 04/30/2021   Procedure: RIGHT FIRST STAGE [place];  Surgeon: Angelia Mould, MD;  Location: [facility];  Service: Vascular;  Laterality: Right;   CESAREAN SECTION  1985   COLONOSCOPY     ESOPHAGOGASTRODUODENOSCOPY (EGD) WITH PROPOFOL N/A 04/18/2021   Procedure: ESOPHAGOGASTRODUODENOSCOPY (EGD) WITH PROPOFOL;  Surgeon: Gatha Mayer, MD;  Location: [facility];  Service: Endoscopy;  Laterality: N/A;   EYE SURGERY     bilateral cataract    FLEXIBLE SIGMOIDOSCOPY N/A 04/18/2021   Procedure: FLEXIBLE SIGMOIDOSCOPY;  Surgeon: Gatha Mayer, MD;  Location[facility]  ENDOSCOPY;  Service: Endoscopy;  Laterality: N/A;   HERNIA REPAIR  1959   IR FLUORO GUIDE CV LINE RIGHT  03/18/2021   IR US GUIDE VASC ACCESS RIGHT  03/18/2021   LEFT HEART CATH AND CORONARY ANGIOGRAPHY N/A 04/04/2017   Procedure: Left Heart Cath and Coronary Angiography;  Surgeon: Belva Crome, MD;  Location: [facility];  Service: Cardiovascular;  Laterality: N/A;   [place], 2004, 2007   PERIPHERAL VASCULAR THROMBECTOMY  02/21/2022   Procedure: PERIPHERAL VASCULAR THROMBECTOMY;  Surgeon: Waynetta Sandy, MD;  Location: [facility];  Service: Cardiovascular;;  Right Tibial   RHINOPLASTY  1971   ROTATOR CUFF REPAIR  2003   SURGICAL REPAIR OF HEMORRHAGE  2015   TONSILLECTOMY  1968   Social History   Occupational History   Occupation: retired  Tobacco Use   Smoking status: Never   Smokeless tobacco: Never  Vaping Use   Vaping Use: Never used  Substance and Sexual Activity   Alcohol use: No   Drug use: No   Sexual activity: Not Currently    Birth control/protection: Post-menopausal

## 2022-07-13 DIAGNOSIS — Z4781 Encounter for orthopedic aftercare following surgical amputation: Secondary | ICD-10-CM | POA: Diagnosis not present

## 2022-07-13 DIAGNOSIS — I10 Essential (primary) hypertension: Secondary | ICD-10-CM | POA: Diagnosis not present

## 2022-07-13 DIAGNOSIS — E785 Hyperlipidemia, unspecified: Secondary | ICD-10-CM | POA: Diagnosis not present

## 2022-07-13 DIAGNOSIS — I132 Hypertensive heart and chronic kidney disease with heart failure and with stage 5 chronic kidney disease, or end stage renal disease: Secondary | ICD-10-CM | POA: Diagnosis not present

## 2022-07-13 DIAGNOSIS — N2581 Secondary hyperparathyroidism of renal origin: Secondary | ICD-10-CM | POA: Diagnosis not present

## 2022-07-13 DIAGNOSIS — I739 Peripheral vascular disease, unspecified: Secondary | ICD-10-CM | POA: Diagnosis not present

## 2022-07-13 DIAGNOSIS — N186 End stage renal disease: Secondary | ICD-10-CM | POA: Diagnosis not present

## 2022-07-13 DIAGNOSIS — G8194 Hemiplegia, unspecified affecting left nondominant side: Secondary | ICD-10-CM | POA: Diagnosis not present

## 2022-07-13 DIAGNOSIS — Z992 Dependence on renal dialysis: Secondary | ICD-10-CM | POA: Diagnosis not present

## 2022-07-13 DIAGNOSIS — E1122 Type 2 diabetes mellitus with diabetic chronic kidney disease: Secondary | ICD-10-CM | POA: Diagnosis not present

## 2022-07-13 DIAGNOSIS — I69391 Dysphagia following cerebral infarction: Secondary | ICD-10-CM | POA: Diagnosis not present

## 2022-07-13 DIAGNOSIS — I5032 Chronic diastolic (congestive) heart failure: Secondary | ICD-10-CM | POA: Diagnosis not present

## 2022-07-14 ENCOUNTER — Ambulatory Visit: Payer: Medicare PPO | Admitting: Internal Medicine

## 2022-07-15 DIAGNOSIS — N2581 Secondary hyperparathyroidism of renal origin: Secondary | ICD-10-CM | POA: Diagnosis not present

## 2022-07-15 DIAGNOSIS — N186 End stage renal disease: Secondary | ICD-10-CM | POA: Diagnosis not present

## 2022-07-15 DIAGNOSIS — Z992 Dependence on renal dialysis: Secondary | ICD-10-CM | POA: Diagnosis not present

## 2022-07-15 NOTE — Progress Notes (Unsigned)
Cardiology Office Note:    Date:  07/19/2022   ID:  Carolyn Russo, DOB 1951/04/05, MRN [id number]  PCP:  [name], [place] [facility] Cardiologist:  Sinclair Grooms, MD     Referring MD: [name], MD   Chief Complaint:  Hospitalization Follow-up     History of Present Illness:   Carolyn Russo is a 71 y.o. female with with a hx of  diabetes mellitus 2, primary hypertension, CVA/TIA June 2022 right basal ganglia, cognitive impairment, history of acute GI bleeding, and stage kidney disease on chronic dialysis (Dr. Danie Chandler), hyperlipidemia, and nonobstructive coronary disease (cardiac catheterization in 2018).       Patient last saw Dr. Tamala Julian 01/2022 and was stable.  Just discharged form the hospital with hypoglycemia and confusion.  CBG was noted to be in the 40s.  On arrival in the ED her CBG had improved to 115 but she remained unresponsive.  She was subsequently intubated for airway protection and admitted to the ICU.  EEG did not show any evidence of active seizures.  CT head at time of admission was negative and CTA of the head and neck revealed no large vessel occlusions.  Neurology nephrology and critical care were consulted during hospitalization.  Echo stable. Had R BKA and is in a rehab facility.l  Denies chest pain. Has a lot of indigestion from cabbage and onions at facility. Her husband says she had some chest pain in the hospital felt to be M-S as it was reproducible and was due to positioning. No complaints today.    Past Medical History:  Diagnosis Date   Acute GI bleeding    Allergy    Anemia    Anterior chest wall pain    Appendicitis 1965   Asthma    Body mass index 37.0-37.9, adult    Breast pain    Cataract    both eyes   CHF (congestive heart failure) ([place])    Chronic kidney disease    stage 5 - on dialysis   Cognitive change 04/20/2021   r/t cva 10/9415   Complication of anesthesia    Memory loss after general    Dehydration 2014   Deviated septum 1971   Diabetes mellitus    Dysphagia due to old stroke    easy to get strangled when eating   Dyspnea 2014   Extrinsic asthma    WITH ASTHMA ATTACK   Fibroid 1980   GERD (gastroesophageal reflux disease)    Heart murmur    History of migraine    History of seizure    with stroke   Hx gestational diabetes    Hyperlipidemia    Hypertension 2014   Inguinal hernia 1959   Malaise and fatigue 2014   Non-IgE mediated allergic asthma 2014   Obesity    Pelvic pain    Pregnancy, high-risk 1985   Stroke ([place]) 04/20/2021   (CVA) of right basal ganglia   Tonsillitis 1968   Uterine fibroid 1980   Visual field defect    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   Current Medications: Current Meds  Medication Sig   acetaminophen (TYLENOL) 500 MG tablet Take 1,000 mg by mouth 2 (two) times daily as needed (leg and arm pain).   Amino Acids-Protein Hydrolys (PRO-STAT) LIQD Take 30 mLs by mouth 2 (two) times daily.   aspirin EC 81 MG tablet Take 1 tablet (81 mg total) by mouth daily. Swallow whole.   carvedilol (COREG)  3.125 MG tablet Take 1 tablet (3.125 mg total) by mouth 2 (two) times daily with a meal.   cephALEXin (KEFLEX) 500 MG capsule Take 500 mg by mouth in the morning and at bedtime.   diclofenac Sodium (VOLTAREN) 1 % GEL Apply 2 g topically 4 (four) times daily. To left wrist   Glucagon, rDNA, (GLUCAGON EMERGENCY) 1 MG KIT Inject 1 mg into the vein as needed (CBG of 110m/dL).   insulin aspart (NOVOLOG) 100 UNIT/ML injection Inject 0-6 Units into the skin 3 (three) times daily with meals. CBG < 70: Implement Hypoglycemia Standing Orders and refer to Hypoglycemia Standing Orders sidebar report CBG 70 - 120: 0 units CBG 121 - 150: 0 units CBG 151 - 200: 1 unit CBG 201-250: 2 units CBG 251-300: 3 units CBG 301-350: 4 units CBG 351-400: 5 units CBG > 400: Give 6 units and call MD   insulin glargine (LANTUS) 100 UNIT/ML Solostar Pen Inject 5 Units into the skin at  bedtime.   levETIRAcetam (KEPPRA) 250 MG tablet Take 1.5 tablets (375 mg total) by mouth every Monday, Wednesday, and Friday at 6 PM.   levETIRAcetam (KEPPRA) 750 MG tablet Take 1 tablet (750 mg total) by mouth at bedtime.   methylphenidate (RITALIN) 5 MG tablet Take 1 tablet (5 mg total) by mouth 2 (two) times daily with breakfast and lunch. Due to stroke attention (Patient taking differently: Take 5 mg by mouth 2 (two) times daily with breakfast and lunch.)   midodrine (PROAMATINE) 10 MG tablet Take 1 tablet (10 mg total) by mouth every Monday, Wednesday, and Friday with hemodialysis.   mometasone-formoterol (DULERA) 200-5 MCG/ACT AERO Inhale 2 puffs into the lungs 2 (two) times daily.   ondansetron (ZOFRAN-ODT) 4 MG disintegrating tablet Take 1 tablet (4 mg total) by mouth every 8 (eight) hours as needed for nausea or vomiting.   pantoprazole (PROTONIX) 40 MG tablet Take 1 tablet (40 mg total) by mouth daily. (Patient taking differently: Take 40 mg by mouth at bedtime.)   polyethylene glycol (MIRALAX / GLYCOLAX) 17 g packet Take 17 g by mouth daily as needed for moderate constipation.   rosuvastatin (CRESTOR) 20 MG tablet Take 1 tablet (20 mg total) by mouth daily. (Patient taking differently: Take 20 mg by mouth at bedtime.)   senna-docusate (SENOKOT-S) 8.6-50 MG tablet Take 2 tablets by mouth 2 (two) times daily.   sevelamer carbonate (RENVELA) 0.8 g PACK packet Take 0.8 g by mouth 3 (three) times daily.   thiamine (VITAMIN B-1) 100 MG tablet Take 1 tablet (100 mg total) by mouth daily.   ticagrelor (BRILINTA) 90 MG TABS tablet Take 1 tablet (90 mg total) by mouth 2 (two) times daily.    Allergies:   Food, Wheat bran, Statins, Gadolinium derivatives, Januvia [sitagliptin], Pork-derived products, Shellfish allergy, and Tetracycline   Social History   Tobacco Use   Smoking status: Never   Smokeless tobacco: Never  Vaping Use   Vaping Use: Never used  Substance Use Topics   Alcohol use: No    Drug use: No    Family Hx: The patient's family history includes Alzheimer's disease in her father; Cancer in her cousin and mother; Diabetes in her cousin and maternal aunt; Psoriasis in her mother. There is no history of Colon cancer, Colon polyps, Esophageal cancer, Rectal cancer, Stomach cancer, or Breast cancer.  ROS   EKGs/Labs/Other Test Reviewed:    EKG:  EKG is  not ordered today.     Recent Labs: 06/02/2022:  TSH 2.095 06/10/2022: Magnesium 1.9 06/11/2022: B Natriuretic Peptide 111.4 06/22/2022: ALT 21 06/29/2022: BUN 30; Creatinine, Ser 6.00; Hemoglobin 9.7; Platelets 358; Potassium 4.5; Sodium 138   Recent Lipid Panel Recent Labs    06/05/22 0112  CHOL 76  TRIG 43  HDL 43  VLDL 9  LDLCALC 24     Prior CV Studies:   Echo 05/2022 IMPRESSIONS     1. Left ventricular ejection fraction, by estimation, is 60 to 65%. The  left ventricle has normal function. The left ventricle has no regional  wall motion abnormalities. Left ventricular diastolic parameters are  consistent with Grade I diastolic  dysfunction (impaired relaxation).   2. Right ventricular systolic function is normal. The right ventricular  size is normal. There is mildly elevated pulmonary artery systolic  pressure. The estimated right ventricular systolic pressure is 08.6 mmHg.   3. The mitral valve is grossly normal. No evidence of mitral valve  regurgitation. No evidence of mitral stenosis.   4. The aortic valve is tricuspid. There is mild calcification of the  aortic valve. There is mild thickening of the aortic valve. Aortic valve  regurgitation is not visualized. Aortic valve sclerosis is present, with  no evidence of aortic valve stenosis.   5. The inferior vena cava is dilated in size with >50% respiratory  variability, suggesting right atrial pressure of 8 mmHg.   Conclusion(s)/Recommendation(s): No intracardiac source of embolism  detected on this transthoracic study. Consider a  transesophageal  echocardiogram to exclude cardiac source of embolism if clinically  indicated.  2D Doppler echocardiogram Mar 11, 2021: IMPRESSIONS     1. Left ventricular ejection fraction, by estimation, is 50%. The left  ventricle has low normal function. The left ventricle has no regional wall  motion abnormalities. There is mild left ventricular hypertrophy. Left  ventricular diastolic parameters are   consistent with Grade II diastolic dysfunction (pseudonormalization).  Elevated left ventricular end-diastolic pressure. The E/e' is 80.   2. Right ventricular systolic function is normal. The right ventricular  size is normal. There is moderately elevated pulmonary artery systolic  pressure. The estimated right ventricular systolic pressure is 76.1 mmHg.   3. Left atrial size was mildly dilated.   4. The mitral valve is grossly normal. Trivial mitral valve  regurgitation. No evidence of mitral stenosis.   5. The aortic valve is grossly normal. There is mild calcification of the  aortic valve. Aortic valve regurgitation is not visualized. Mild aortic  valve sclerosis is present, with no evidence of aortic valve stenosis.   6. The inferior vena cava is dilated in size with >50% respiratory  variability, suggesting right atrial pressure of 8 mmHg.       Risk Assessment/Calculations/Metrics:              Physical Exam:    VS:  BP 138/70   Pulse 70   Ht 5' 5" (1.651 m)   Wt 180 lb (81.6 kg) Comment: Pt stated weight  SpO2 95%   BMI 29.95 kg/m     Wt Readings from Last 3 Encounters:  07/19/22 180 lb (81.6 kg)  06/29/22 169 lb 8.5 oz (76.9 kg)  06/03/22 173 lb 15.1 oz (78.9 kg)    Physical Exam  GEN: Obese, in a wheelchair, in no acute distress  Neck: no JVD, carotid bruits, or masses Cardiac:RRR; no murmurs, rubs, or gallops  Respiratory:  clear to auscultation bilaterally, normal work of breathing GI: soft, nontender, nondistended, + BS Ext: RBKA, decreased  pulses on left Neuro:  Alert and Oriented x 3,  Psych: euthymic mood, full affect       ASSESSMENT & PLAN:   No problem-specific Assessment & Plan notes found for this encounter.   CAD-nonobstuctive on cath 2018. No recurrent chest pain  HTN BP controlled  History of CVA/TIA 03/2021  ESRD on HD  HLD LDL  24 05/2022 on crestor          Dispo:  No follow-ups on file.   Medication Adjustments/Labs and Tests Ordered: Current medicines are reviewed at length with the patient today.  Concerns regarding medicines are outlined above.  Tests Ordered: No orders of the defined types were placed in this encounter.  Medication Changes: No orders of the defined types were placed in this encounter.  Signed, Ermalinda Barrios, PA-C  07/19/2022 8:27 AM    [address], [place], [place]  [address] Phone: [phone]; Fax: [phone]

## 2022-07-18 DIAGNOSIS — Z4781 Encounter for orthopedic aftercare following surgical amputation: Secondary | ICD-10-CM | POA: Diagnosis not present

## 2022-07-18 DIAGNOSIS — I10 Essential (primary) hypertension: Secondary | ICD-10-CM | POA: Diagnosis not present

## 2022-07-18 DIAGNOSIS — E785 Hyperlipidemia, unspecified: Secondary | ICD-10-CM | POA: Diagnosis not present

## 2022-07-18 DIAGNOSIS — Z992 Dependence on renal dialysis: Secondary | ICD-10-CM | POA: Diagnosis not present

## 2022-07-18 DIAGNOSIS — N186 End stage renal disease: Secondary | ICD-10-CM | POA: Diagnosis not present

## 2022-07-18 DIAGNOSIS — N2581 Secondary hyperparathyroidism of renal origin: Secondary | ICD-10-CM | POA: Diagnosis not present

## 2022-07-19 ENCOUNTER — Encounter: Payer: Self-pay | Admitting: Physician Assistant

## 2022-07-19 ENCOUNTER — Ambulatory Visit: Payer: Medicare PPO | Attending: Physician Assistant | Admitting: Physician Assistant

## 2022-07-19 VITALS — BP 138/70 | HR 70 | Ht 65.0 in | Wt 180.0 lb

## 2022-07-19 DIAGNOSIS — E785 Hyperlipidemia, unspecified: Secondary | ICD-10-CM | POA: Diagnosis not present

## 2022-07-19 DIAGNOSIS — I10 Essential (primary) hypertension: Secondary | ICD-10-CM

## 2022-07-19 DIAGNOSIS — Z8673 Personal history of transient ischemic attack (TIA), and cerebral infarction without residual deficits: Secondary | ICD-10-CM | POA: Diagnosis not present

## 2022-07-19 DIAGNOSIS — I2510 Atherosclerotic heart disease of native coronary artery without angina pectoris: Secondary | ICD-10-CM | POA: Diagnosis not present

## 2022-07-19 DIAGNOSIS — Z992 Dependence on renal dialysis: Secondary | ICD-10-CM | POA: Diagnosis not present

## 2022-07-19 DIAGNOSIS — N186 End stage renal disease: Secondary | ICD-10-CM

## 2022-07-19 NOTE — Patient Instructions (Signed)
Medication Instructions:  Your physician recommends that you continue on your current medications as directed. Please refer to the Current Medication list given to you today.  *If you need a refill on your cardiac medications before your next appointment, please call your pharmacy*   Lab Work: None ordered   If you have labs (blood work) drawn today and your tests are completely normal, you will receive your results only by: MyChart Message (if you have MyChart) OR A paper copy in the mail If you have any lab test that is abnormal or we need to change your treatment, we will call you to review the results.   Testing/Procedures: None ordered    Follow-Up: Follow up as scheduled   Other Instructions   Important Information About Sugar       

## 2022-07-20 DIAGNOSIS — I132 Hypertensive heart and chronic kidney disease with heart failure and with stage 5 chronic kidney disease, or end stage renal disease: Secondary | ICD-10-CM | POA: Diagnosis not present

## 2022-07-20 DIAGNOSIS — N186 End stage renal disease: Secondary | ICD-10-CM | POA: Diagnosis not present

## 2022-07-20 DIAGNOSIS — I5032 Chronic diastolic (congestive) heart failure: Secondary | ICD-10-CM | POA: Diagnosis not present

## 2022-07-20 DIAGNOSIS — E1122 Type 2 diabetes mellitus with diabetic chronic kidney disease: Secondary | ICD-10-CM | POA: Diagnosis not present

## 2022-07-20 DIAGNOSIS — I69391 Dysphagia following cerebral infarction: Secondary | ICD-10-CM | POA: Diagnosis not present

## 2022-07-20 DIAGNOSIS — I739 Peripheral vascular disease, unspecified: Secondary | ICD-10-CM | POA: Diagnosis not present

## 2022-07-20 DIAGNOSIS — Z992 Dependence on renal dialysis: Secondary | ICD-10-CM | POA: Diagnosis not present

## 2022-07-20 DIAGNOSIS — Z4781 Encounter for orthopedic aftercare following surgical amputation: Secondary | ICD-10-CM | POA: Diagnosis not present

## 2022-07-20 DIAGNOSIS — G8194 Hemiplegia, unspecified affecting left nondominant side: Secondary | ICD-10-CM | POA: Diagnosis not present

## 2022-07-20 DIAGNOSIS — E785 Hyperlipidemia, unspecified: Secondary | ICD-10-CM | POA: Diagnosis not present

## 2022-07-20 DIAGNOSIS — N2581 Secondary hyperparathyroidism of renal origin: Secondary | ICD-10-CM | POA: Diagnosis not present

## 2022-07-22 DIAGNOSIS — Z992 Dependence on renal dialysis: Secondary | ICD-10-CM | POA: Diagnosis not present

## 2022-07-22 DIAGNOSIS — N186 End stage renal disease: Secondary | ICD-10-CM | POA: Diagnosis not present

## 2022-07-22 DIAGNOSIS — N2581 Secondary hyperparathyroidism of renal origin: Secondary | ICD-10-CM | POA: Diagnosis not present

## 2022-07-23 DIAGNOSIS — Z992 Dependence on renal dialysis: Secondary | ICD-10-CM | POA: Diagnosis not present

## 2022-07-23 DIAGNOSIS — E1122 Type 2 diabetes mellitus with diabetic chronic kidney disease: Secondary | ICD-10-CM | POA: Diagnosis not present

## 2022-07-23 DIAGNOSIS — N186 End stage renal disease: Secondary | ICD-10-CM | POA: Diagnosis not present

## 2022-07-25 DIAGNOSIS — Z992 Dependence on renal dialysis: Secondary | ICD-10-CM | POA: Diagnosis not present

## 2022-07-25 DIAGNOSIS — N2581 Secondary hyperparathyroidism of renal origin: Secondary | ICD-10-CM | POA: Diagnosis not present

## 2022-07-25 DIAGNOSIS — N186 End stage renal disease: Secondary | ICD-10-CM | POA: Diagnosis not present

## 2022-07-27 ENCOUNTER — Ambulatory Visit (HOSPITAL_COMMUNITY): Payer: Medicare PPO

## 2022-07-27 ENCOUNTER — Encounter: Payer: Self-pay | Admitting: Physician Assistant

## 2022-07-27 ENCOUNTER — Ambulatory Visit: Payer: Medicare PPO | Admitting: Physician Assistant

## 2022-07-27 ENCOUNTER — Ambulatory Visit (HOSPITAL_COMMUNITY)
Admission: RE | Admit: 2022-07-27 | Discharge: 2022-07-27 | Disposition: A | Discharge status: Home / Self Care | Payer: Medicare PPO | Source: Ambulatory Visit | Attending: Emergency Medicine | Admitting: Emergency Medicine

## 2022-07-27 VITALS — BP 136/67 | HR 71 | Temp 97.5°F

## 2022-07-27 DIAGNOSIS — I10 Essential (primary) hypertension: Secondary | ICD-10-CM | POA: Diagnosis not present

## 2022-07-27 DIAGNOSIS — I739 Peripheral vascular disease, unspecified: Secondary | ICD-10-CM | POA: Diagnosis not present

## 2022-07-27 DIAGNOSIS — N2581 Secondary hyperparathyroidism of renal origin: Secondary | ICD-10-CM | POA: Diagnosis not present

## 2022-07-27 DIAGNOSIS — N186 End stage renal disease: Secondary | ICD-10-CM

## 2022-07-27 DIAGNOSIS — E785 Hyperlipidemia, unspecified: Secondary | ICD-10-CM | POA: Diagnosis not present

## 2022-07-27 DIAGNOSIS — Z992 Dependence on renal dialysis: Secondary | ICD-10-CM | POA: Diagnosis not present

## 2022-07-27 DIAGNOSIS — Z4781 Encounter for orthopedic aftercare following surgical amputation: Secondary | ICD-10-CM | POA: Diagnosis not present

## 2022-07-27 NOTE — Progress Notes (Signed)
HISTORY AND PHYSICAL     CC:  follow up. Requesting Provider:  [name], MD  HPI: This is a 71 y.o. female who is here today with her husband for follow up for PAD.  Pt has hx of ESRD via [place].  She has a 1st stage right BVT that has not been transposed.    She has hx of ulcerations on the right foot and subsequently underwent right BKA by Dr. Sharol Given.  It is slow to heal.    She presents today and has a small ulceration on the left lateral heel.  She and her husband tell me it has been present for 3-4 days.  She has discoloration of the left 3rd toe and she states it has been that way since she was a kid riding bikes.  She does not have any pain in her left foot at night.  She states that at the facility they are changing her right BKA dressing daily.   She does work with PT.   She has hx of stroke that affected the left side.   She dialyzes M/W/F at the [facility] location.   The pt is on a statin for cholesterol management.    The pt is on an aspirin.    Other AC:  Brilinta The pt is on BB for hypertension.  The pt does  have diabetes. Tobacco hx:  never    Past Medical History:  Diagnosis Date   Acute GI bleeding    Allergy    Anemia    Anterior chest wall pain    Appendicitis 1965   Asthma    Body mass index 37.0-37.9, adult    Breast pain    Cataract    both eyes   CHF (congestive heart failure) ([place])    Chronic kidney disease    stage 5 - on dialysis   Cognitive change 04/20/2021   r/t cva 02/7902   Complication of anesthesia    Memory loss after general   Dehydration 2014   Deviated septum 1971   Diabetes mellitus    Dysphagia due to old stroke    easy to get strangled when eating   Dyspnea 2014   Extrinsic asthma    WITH ASTHMA ATTACK   Fibroid 1980   GERD (gastroesophageal reflux disease)    Heart murmur    History of migraine    History of seizure    with stroke   Hx gestational diabetes    Hyperlipidemia    Hypertension 2014    Inguinal hernia 1959   Malaise and fatigue 2014   Non-IgE mediated allergic asthma 2014   Obesity    Pelvic pain    Pregnancy, high-risk 1985   Stroke ([place]) 04/20/2021   (CVA) of right basal ganglia   Tonsillitis 1968   Uterine fibroid 1980   Visual field defect    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    Past Surgical History:  Procedure Laterality Date   ABDOMINAL AORTOGRAM W/LOWER EXTREMITY N/A 02/21/2022   Procedure: ABDOMINAL AORTOGRAM W/LOWER EXTREMITY;  Surgeon: Waynetta Sandy, MD;  Location: [facility];  Service: Cardiovascular;  Laterality: N/A;   AMPUTATION Right 05/18/2022   Procedure: RIGHT BELOW KNEE AMPUTATION;  Surgeon: Newt Minion, MD;  Location: [facility];  Service: Orthopedics;  Laterality: Right;   AMPUTATION TOE Right 04/15/2022   Procedure: AMPUTATION  LST TOERIGHT FOOT;  Surgeon: Criselda Peaches, DPM;  Location: [facility];  Service: Podiatry;  Laterality:  Right;   APPENDECTOMY  3428   BASCILIC VEIN TRANSPOSITION Right 04/30/2021   Procedure: RIGHT FIRST STAGE BASCILIC VEIN TRANSPOSITION;  Surgeon: Angelia Mould, MD;  Location: [facility];  Service: Vascular;  Laterality: Right;   CESAREAN SECTION  1985   COLONOSCOPY     ESOPHAGOGASTRODUODENOSCOPY (EGD) WITH PROPOFOL N/A 04/18/2021   Procedure: ESOPHAGOGASTRODUODENOSCOPY (EGD) WITH PROPOFOL;  Surgeon: Gatha Mayer, MD;  Location: [facility];  Service: Endoscopy;  Laterality: N/A;   EYE SURGERY     bilateral cataract    FLEXIBLE SIGMOIDOSCOPY N/A 04/18/2021   Procedure: FLEXIBLE SIGMOIDOSCOPY;  Surgeon: Gatha Mayer, MD;  Location: [facility];  Service: Endoscopy;  Laterality: N/A;   HERNIA REPAIR  1959   IR FLUORO GUIDE CV LINE RIGHT  03/18/2021   IR US GUIDE VASC ACCESS RIGHT  03/18/2021   LEFT HEART CATH AND CORONARY ANGIOGRAPHY N/A 04/04/2017   Procedure: Left Heart Cath and Coronary Angiography;  Surgeon: Belva Crome, MD;  Location: [place] [facility] [facility];  Service: Cardiovascular;  Laterality: N/A;    [place], 2004, 2007   PERIPHERAL VASCULAR THROMBECTOMY  02/21/2022   Procedure: PERIPHERAL VASCULAR THROMBECTOMY;  Surgeon: Waynetta Sandy, MD;  Location: [facility];  Service: Cardiovascular;;  Right Tibial   RHINOPLASTY  1971   ROTATOR CUFF REPAIR  2003   SURGICAL REPAIR OF HEMORRHAGE  2015   TONSILLECTOMY  1968    Allergies  Allergen Reactions   Food Anaphylaxis    Peanuts - anaphylaxis Almonds - anaphylaxis Not listed on MAR   Wheat Bran Other (See Comments)    Upset stomach  Not listed on MAR   Statins Itching and Other (See Comments)    Generalized aches- tolerates crestor  Not listed on MAR   Gadolinium Derivatives Other (See Comments)    Gadolinium-Containing Contrast Media Listed as an allergy on MAR Unknown reaction   Januvia [Sitagliptin] Other (See Comments)    Listed as an allergy on MAR Unknown reaction   Pork-Derived Products Other (See Comments)    Does not eat pork  Listed on MAR   Shellfish Allergy Other (See Comments)    Mouth gets raw Listed on MAR   Tetracycline Other (See Comments)    Raw mouth Not listed on MAR    Current Outpatient Medications  Medication Sig Dispense Refill   acetaminophen (TYLENOL) 500 MG tablet Take 1,000 mg by mouth 2 (two) times daily as needed (leg and arm pain).     Amino Acids-Protein Hydrolys (PRO-STAT) LIQD Take 30 mLs by mouth 2 (two) times daily.     aspirin EC 81 MG tablet Take 1 tablet (81 mg total) by mouth daily. Swallow whole. 30 tablet 12   carvedilol (COREG) 3.125 MG tablet Take 1 tablet (3.125 mg total) by mouth 2 (two) times daily with a meal. 180 tablet 1   cephALEXin (KEFLEX) 500 MG capsule Take 500 mg by mouth in the morning and at bedtime.     diclofenac Sodium (VOLTAREN) 1 % GEL Apply 2 g topically 4 (four) times daily. To left wrist 150 g 0   Glucagon, rDNA, (GLUCAGON EMERGENCY) 1 MG KIT Inject 1 mg into the vein as needed (CBG of 7m/dL).     insulin aspart (NOVOLOG) 100  UNIT/ML injection Inject 0-6 Units into the skin 3 (three) times daily with meals. CBG < 70: Implement Hypoglycemia Standing Orders and refer to Hypoglycemia Standing Orders sidebar report CBG 70 - 120: 0 units CBG  121 - 150: 0 units CBG 151 - 200: 1 unit CBG 201-250: 2 units CBG 251-300: 3 units CBG 301-350: 4 units CBG 351-400: 5 units CBG > 400: Give 6 units and call MD     insulin glargine (LANTUS) 100 UNIT/ML Solostar Pen Inject 5 Units into the skin at bedtime. 15 mL    levETIRAcetam (KEPPRA) 250 MG tablet Take 1.5 tablets (375 mg total) by mouth every Monday, Wednesday, and Friday at 6 PM.     levETIRAcetam (KEPPRA) 750 MG tablet Take 1 tablet (750 mg total) by mouth at bedtime.     methylphenidate (RITALIN) 5 MG tablet Take 1 tablet (5 mg total) by mouth 2 (two) times daily with breakfast and lunch. Due to stroke attention (Patient taking differently: Take 5 mg by mouth 2 (two) times daily with breakfast and lunch.) 180 tablet 0   midodrine (PROAMATINE) 10 MG tablet Take 1 tablet (10 mg total) by mouth every Monday, Wednesday, and Friday with hemodialysis.     mometasone-formoterol (DULERA) 200-5 MCG/ACT AERO Inhale 2 puffs into the lungs 2 (two) times daily. 3 each 2   ondansetron (ZOFRAN-ODT) 4 MG disintegrating tablet Take 1 tablet (4 mg total) by mouth every 8 (eight) hours as needed for nausea or vomiting. 20 tablet 1   pantoprazole (PROTONIX) 40 MG tablet Take 1 tablet (40 mg total) by mouth daily. (Patient taking differently: Take 40 mg by mouth at bedtime.) 90 tablet 2   polyethylene glycol (MIRALAX / GLYCOLAX) 17 g packet Take 17 g by mouth daily as needed for moderate constipation.  0   rosuvastatin (CRESTOR) 20 MG tablet Take 1 tablet (20 mg total) by mouth daily. (Patient taking differently: Take 20 mg by mouth at bedtime.) 90 tablet 2   senna-docusate (SENOKOT-S) 8.6-50 MG tablet Take 2 tablets by mouth 2 (two) times daily. 360 tablet 2   sevelamer carbonate (RENVELA) 0.8 g  PACK packet Take 0.8 g by mouth 3 (three) times daily.     thiamine (VITAMIN B-1) 100 MG tablet Take 1 tablet (100 mg total) by mouth daily. 30 tablet 1   ticagrelor (BRILINTA) 90 MG TABS tablet Take 1 tablet (90 mg total) by mouth 2 (two) times daily. 60 tablet 3   No current facility-administered medications for this visit.    Family History  Problem Relation Age of Onset   Cancer Mother        abdominal melamona   Psoriasis Mother    Alzheimer's disease Father    Cancer Cousin        colon    Diabetes Cousin    Diabetes Maternal Aunt    Colon cancer Neg Hx    Colon polyps Neg Hx    Esophageal cancer Neg Hx    Rectal cancer Neg Hx    Stomach cancer Neg Hx    Breast cancer Neg Hx     Social History   Socioeconomic History   Marital status: Married    Spouse name: [name]   Number of children: 1   Years of education: Not on file   Highest education level: Professional school degree (e.g., MD, DDS, DVM, JD)  Occupational History   Occupation: retired  Tobacco Use   Smoking status: Never   Smokeless tobacco: Never  Vaping Use   Vaping Use: Never used  Substance and Sexual Activity   Alcohol use: No   Drug use: No   Sexual activity: Not Currently    Birth control/protection: Post-menopausal  Other  Topics Concern   Not on file  Social History Narrative   06/21/21 lives with husband Dr Marylynn Pearson, caregiver- Zigmund Daniel   Patient reports she used to walk at [employer] but now due to Covid-19 she is staying inside.   Social Determinants of Health   Financial Resource Strain: Low Risk  (09/02/2021)   Overall Financial Resource Strain (CARDIA)    Difficulty of Paying Living Expenses: Not hard at all  Food Insecurity: No Food Insecurity (09/02/2021)   Hunger Vital Sign    Worried About Running Out of Food in the Last Year: Never true    Ran Out of Food in the Last Year: Never true  Transportation Needs: No Transportation Needs (09/02/2021)   PRAPARE - Radiographer, therapeutic (Medical): No    Lack of Transportation (Non-Medical): No  Physical Activity: Inactive (09/02/2021)   Exercise Vital Sign    Days of Exercise per Week: 0 days    Minutes of Exercise per Session: 0 min  Stress: Stress Concern Present (09/02/2021)   [place]    Feeling of Stress : To some extent  Social Connections: Socially Integrated (01/09/2019)   Social Connection and Isolation Panel [NHANES]    Frequency of Communication with Friends and Family: More than three times a week    Frequency of Social Gatherings with Friends and Family: More than three times a week    Attends Religious Services: More than 4 times per year    Active Member of [employer] or Organizations: Yes    Attends Music therapist: More than 4 times per year    Marital Status: Married  Human resources officer Violence: Not At Risk (03/27/2019)   Humiliation, Afraid, Rape, and Kick questionnaire    Fear of Current or Ex-Partner: No    Emotionally Abused: No    Physically Abused: No    Sexually Abused: No     REVIEW OF SYSTEMS:   '[X]'  denotes positive finding, '[ ]'  denotes negative finding Cardiac  Comments:  Chest pain or chest pressure:    Shortness of breath upon exertion:    Short of breath when lying flat:    Irregular heart rhythm:        Vascular    Pain in calf, thigh, or hip brought on by ambulation:    Pain in feet at night that wakes you up from your sleep:     Blood clot in your veins:    Leg swelling:         Pulmonary    Oxygen at home:    Productive cough:     Wheezing:         Neurologic    Sudden weakness in arms or legs:     Sudden numbness in arms or legs:     Sudden onset of difficulty speaking or slurred speech:    Temporary loss of vision in one eye:     Problems with dizziness:         Gastrointestinal    Blood in stool:     Vomited blood:         Genitourinary    Burning when urinating:      Blood in urine:        Psychiatric    Major depression:         Hematologic    Bleeding problems:    Problems with blood clotting too easily:  Skin    Rashes or ulcers: x See HPI      Constitutional    Fever or chills:      PHYSICAL EXAMINATION:  Today's Vitals   07/27/22 1434  BP: 136/67  Pulse: 71  Temp: (!) 97.5 F (36.4 C)  TempSrc: Temporal  SpO2: 97%  PainSc: 0-No pain   There is no height or weight on file to calculate BMI.   General:  WDWN in NAD; vital signs documented above Gait: Not observed HENT: WNL, normocephalic Pulmonary: normal non-labored breathing , without wheezing Cardiac: regular HR Skin: dry skin on left heel Vascular Exam/Pulses: + monophasic doppler signal left PT Extremities:  Right BKA:   Left foot   Left heel-lateral   Left heel   Musculoskeletal: no muscle wasting or atrophy  Neurologic: A&O X 3; left upper extremity weakness; she is able to slowly open the fingers on the left hand.  Psychiatric:  The pt has Normal affect.   Non-Invasive Vascular Imaging:   ABI's/TBI's on 07/27/2022: Right:  BKA  Left:  1.13 (M)/0 - Great toe pressure: 0     ASSESSMENT/PLAN:: 71 y.o. female here for follow up for PAD with hx of PAD with right foot non healing wounds with subsequent right BKA and now with new small left heel wound  PAD -pt with monophasic left PT doppler signal.  She does have heel wound as above.  I discussed with her and her husband that her foot must be elevated off the bed at all times when in bed to prevent further wound issues.  Her toe pressure was decreased previously and is not obtainable today.  Discussed pt with Dr. [name] and will have her return in 4 weeks.  If her wound has worsened then she would require angiography.  They know to call sooner if this wound worsens.  The left 3rd toe is discolored.  She tells me this has been present for many years.  I have asked them to keep an eye on this toe as well  and to contact us if this worsens as well.   I discussed this with she and her husband.   -her left BKA is slow to heal as pictured above.  Wound care per Dr. Sharol Given.   ESRD -pt currently dialyzing via [place].  She does have a right arm 1st stage BVT that has a good thrill.  This would need to undergo 2nd stage before it could be used.      Leontine Locket, [facility] [phone]  Clinic MD:   [name]

## 2022-07-29 DIAGNOSIS — N2581 Secondary hyperparathyroidism of renal origin: Secondary | ICD-10-CM | POA: Diagnosis not present

## 2022-07-29 DIAGNOSIS — N186 End stage renal disease: Secondary | ICD-10-CM | POA: Diagnosis not present

## 2022-07-29 DIAGNOSIS — Z992 Dependence on renal dialysis: Secondary | ICD-10-CM | POA: Diagnosis not present

## 2022-07-30 DIAGNOSIS — Z741 Need for assistance with personal care: Secondary | ICD-10-CM | POA: Diagnosis not present

## 2022-07-30 DIAGNOSIS — M6281 Muscle weakness (generalized): Secondary | ICD-10-CM | POA: Diagnosis not present

## 2022-07-30 DIAGNOSIS — R2689 Other abnormalities of gait and mobility: Secondary | ICD-10-CM | POA: Diagnosis not present

## 2022-07-30 DIAGNOSIS — M6259 Muscle wasting and atrophy, not elsewhere classified, multiple sites: Secondary | ICD-10-CM | POA: Diagnosis not present

## 2022-08-01 DIAGNOSIS — M6259 Muscle wasting and atrophy, not elsewhere classified, multiple sites: Secondary | ICD-10-CM | POA: Diagnosis not present

## 2022-08-01 DIAGNOSIS — R2689 Other abnormalities of gait and mobility: Secondary | ICD-10-CM | POA: Diagnosis not present

## 2022-08-01 DIAGNOSIS — Z992 Dependence on renal dialysis: Secondary | ICD-10-CM | POA: Diagnosis not present

## 2022-08-01 DIAGNOSIS — Z741 Need for assistance with personal care: Secondary | ICD-10-CM | POA: Diagnosis not present

## 2022-08-01 DIAGNOSIS — M6281 Muscle weakness (generalized): Secondary | ICD-10-CM | POA: Diagnosis not present

## 2022-08-01 DIAGNOSIS — N186 End stage renal disease: Secondary | ICD-10-CM | POA: Diagnosis not present

## 2022-08-01 DIAGNOSIS — N2581 Secondary hyperparathyroidism of renal origin: Secondary | ICD-10-CM | POA: Diagnosis not present

## 2022-08-02 ENCOUNTER — Encounter: Payer: Self-pay | Admitting: Family

## 2022-08-02 ENCOUNTER — Ambulatory Visit (INDEPENDENT_AMBULATORY_CARE_PROVIDER_SITE_OTHER): Payer: Medicare PPO | Admitting: Family

## 2022-08-02 DIAGNOSIS — R2689 Other abnormalities of gait and mobility: Secondary | ICD-10-CM | POA: Diagnosis not present

## 2022-08-02 DIAGNOSIS — Z741 Need for assistance with personal care: Secondary | ICD-10-CM | POA: Diagnosis not present

## 2022-08-02 DIAGNOSIS — E1165 Type 2 diabetes mellitus with hyperglycemia: Secondary | ICD-10-CM | POA: Diagnosis not present

## 2022-08-02 DIAGNOSIS — Z89511 Acquired absence of right leg below knee: Secondary | ICD-10-CM

## 2022-08-02 DIAGNOSIS — S88111A Complete traumatic amputation at level between knee and ankle, right lower leg, initial encounter: Secondary | ICD-10-CM

## 2022-08-02 DIAGNOSIS — M6259 Muscle wasting and atrophy, not elsewhere classified, multiple sites: Secondary | ICD-10-CM | POA: Diagnosis not present

## 2022-08-02 DIAGNOSIS — M6281 Muscle weakness (generalized): Secondary | ICD-10-CM | POA: Diagnosis not present

## 2022-08-02 MED ORDER — SANTYL 250 UNIT/GM EX OINT: 1.0000 | TOPICAL_OINTMENT | Freq: Every day | CUTANEOUS | 0 refills | Status: DC

## 2022-08-02 NOTE — Progress Notes (Signed)
Post-Op Visit Note   Patient: Carolyn Russo           Date of Birth: [birth year]           MRN: [id number] Visit Date: 08/02/2022 PCP: [name], MD  Chief Complaint:  Chief Complaint  Patient presents with   Right Leg - Routine Post Op    05/18/22 right BKA    HPI:  HPI The patient is a 71 year old woman who presents status post right below-knee amputation on July 26 she has been doing Iodosorb dressing changes daily.  She is residing at [facility].  She is continue to wear her shrinker and limb protector Ortho Exam On examination of the right residual limb medially this is well-healed laterally there are 2 areas of ulceration the largest area of ulceration is 5 cm x 5 cm but 2 cm deep this is debrided with a 10 blade knife back to viable tissue eschar debrided there is fibrinous tissue in the wound bed no surrounding erythema no drainage no odor Visit Diagnoses: No diagnosis found. Plan: Given an order for Santyl.  They will continue daily Dial soap cleansing.  Santyl to areas of exudative tissue or eschar.  Continue shrinker continue limb protector.  Plan to follow-up in 2 weeks hope to apply Kerecis powder at that time  Follow-Up Instructions: No follow-ups on file.   Imaging: No results found.  Orders:  No orders of the defined types were placed in this encounter.  Meds ordered this encounter  Medications   collagenase (SANTYL) 250 UNIT/GM ointment    Sig: Apply 1 Application topically daily.    Dispense:  30 g    Refill:  0     PMFS History: Patient Active Problem List   Diagnosis Date Noted   Urinary tract infection without hematuria    Diabetes mellitus due to underlying condition with hypoglycemia without coma, with long-term current use of insulin (HCC)    Acute encephalopathy 06/22/2022   Acute respiratory failure ([place])    Unresponsiveness    Pressure ulcer 06/07/2022   Acute stroke due to ischemia ([place]) 06/03/2022   Chest pain 06/01/2022    History of CVA (cerebrovascular accident) 06/01/2022   Seizure ([place])    Encephalopathy    Below-knee amputation of right lower extremity ([place])    Leukocytosis    Foot infection 05/10/2022   PAD (peripheral artery disease) ([place]) 05/10/2022   Gangrene of right foot ([place]) 04/15/2022   Left hemiparesis ([place]) 09/24/2021   Abnormality of gait 09/24/2021   Pain of right hand 08/06/2021   Steal syndrome of dialysis vascular access ([place]) 08/06/2021   Subclavian steal syndrome of right subclavian artery 06/25/2021   Right hand weakness 06/25/2021   Stroke-like symptoms 05/06/2021   Bandemia    Cerebrovascular accident (CVA) of right basal ganglia ([place]) 04/20/2021   External hemorrhoids    Basal ganglia infarction ([place]) 04/16/2021   ESRD on dialysis ([place]) 04/16/2021   Hypertensive urgency 03/10/2021   Hilar enlargement    Anemia of chronic disease    Chronic diastolic CHF (congestive heart failure) ([place]) 03/09/2021   CKD (chronic kidney disease), stage V ([place]) 03/08/2021   Diabetic retinopathy ([place]) 03/08/2021   Hyperglycemia due to type 2 diabetes mellitus ([place]) 03/08/2021   Pure hypercholesterolemia 03/08/2021   Anemia in chronic kidney disease 09/24/2020   Uncontrolled type 2 diabetes mellitus with hyperglycemia ([place]) 03/19/2019   Osteopenia 09/04/2018   Migraine 09/04/2018   Asthma 09/04/2018   Pseudophakia  of both eyes 07/17/2018   Pseudophakia of left eye 07/03/2018   Open angle with borderline findings and high glaucoma risk in left eye 07/03/2018   Age-related nuclear cataract of right eye 07/03/2018   CAD (coronary artery disease), native coronary artery 04/27/2017   Abnormal nuclear stress test 04/04/2017   Shortness of breath 03/09/2017   Appendicitis    Age-related hypermature cataract of both eyes 12/20/2016   Uterine leiomyoma 11/27/2012   Dyslipidemia (high LDL; low HDL) 11/25/2011   Obesity (BMI 30-39.9) 11/25/2011   Hypertensive disorder 11/21/2011   Type 2  diabetes mellitus ([place]) 11/21/2011   Abnormal EKG 11/21/2011   Past Medical History:  Diagnosis Date   Acute GI bleeding    Allergy    Anemia    Anterior chest wall pain    Appendicitis 1965   Asthma    Body mass index 37.0-37.9, adult    Breast pain    Cataract    both eyes   CHF (congestive heart failure) ([place])    Chronic kidney disease    stage 5 - on dialysis   Cognitive change 04/20/2021   r/t cva 11/1222   Complication of anesthesia    Memory loss after general   Dehydration 2014   Deviated septum 1971   Diabetes mellitus    Dysphagia due to old stroke    easy to get strangled when eating   Dyspnea 2014   Extrinsic asthma    WITH ASTHMA ATTACK   Fibroid 1980   GERD (gastroesophageal reflux disease)    Heart murmur    History of migraine    History of seizure    with stroke   Hx gestational diabetes    Hyperlipidemia    Hypertension 2014   Inguinal hernia 1959   Malaise and fatigue 2014   Non-IgE mediated allergic asthma 2014   Obesity    Pelvic pain    Pregnancy, high-risk 1985   Stroke ([place]) 04/20/2021   (CVA) of right basal ganglia   Tonsillitis 1968   Uterine fibroid 1980   Visual field defect    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    Family History  Problem Relation Age of Onset   Cancer Mother        abdominal melamona   Psoriasis Mother    Alzheimer's disease Father    Cancer Cousin        colon    Diabetes Cousin    Diabetes Maternal Aunt    Colon cancer Neg Hx    Colon polyps Neg Hx    Esophageal cancer Neg Hx    Rectal cancer Neg Hx    Stomach cancer Neg Hx    Breast cancer Neg Hx     Past Surgical History:  Procedure Laterality Date   ABDOMINAL AORTOGRAM W/LOWER EXTREMITY N/A 02/21/2022   Procedure: ABDOMINAL AORTOGRAM W/LOWER EXTREMITY;  Surgeon: Waynetta Sandy, MD;  Location: [facility];  Service: Cardiovascular;  Laterality: N/A;   AMPUTATION Right 05/18/2022   Procedure: RIGHT BELOW KNEE AMPUTATION;  Surgeon: Newt Minion, MD;  Location: [facility];  Service: Orthopedics;  Laterality: Right;   AMPUTATION TOE Right 04/15/2022   Procedure: AMPUTATION  LST TOERIGHT FOOT;  Surgeon: Criselda Peaches, DPM;  Location[facility];  Service: Podiatry;  Laterality: Right;   APPENDECTOMY  8250   BASCILIC VEIN TRANSPOSITION Right 04/30/2021   Procedure: RIGHT FIRST STAGE BASCILIC VEIN TRANSPOSITION;  Surgeon: Angelia Mould, MD;  Location: [facility];  Service: Vascular;  Laterality: Right;   CESAREAN SECTION  1985   COLONOSCOPY     ESOPHAGOGASTRODUODENOSCOPY (EGD) WITH PROPOFOL N/A 04/18/2021   Procedure: ESOPHAGOGASTRODUODENOSCOPY (EGD) WITH PROPOFOL;  Surgeon: Gatha Mayer, MD;  Location: [facility];  Service: Endoscopy;  Laterality: N/A;   EYE SURGERY     bilateral cataract    FLEXIBLE SIGMOIDOSCOPY N/A 04/18/2021   Procedure: FLEXIBLE SIGMOIDOSCOPY;  Surgeon: Gatha Mayer, MD;  Location: [facility];  Service: Endoscopy;  Laterality: N/A;   HERNIA REPAIR  1959   IR FLUORO GUIDE CV LINE RIGHT  03/18/2021   IR US GUIDE VASC ACCESS RIGHT  03/18/2021   LEFT HEART CATH AND CORONARY ANGIOGRAPHY N/A 04/04/2017   Procedure: Left Heart Cath and Coronary Angiography;  Surgeon: Belva Crome, MD;  Location: [facility];  Service: Cardiovascular;  Laterality: N/A;   [place], 2004, 2007   PERIPHERAL VASCULAR THROMBECTOMY  02/21/2022   Procedure: PERIPHERAL VASCULAR THROMBECTOMY;  Surgeon: Waynetta Sandy, MD;  Location: [facility];  Service: Cardiovascular;;  Right Tibial   RHINOPLASTY  1971   ROTATOR CUFF REPAIR  2003   SURGICAL REPAIR OF HEMORRHAGE  2015   TONSILLECTOMY  1968   Social History   Occupational History   Occupation: retired  Tobacco Use   Smoking status: Never   Smokeless tobacco: Never  Vaping Use   Vaping Use: Never used  Substance and Sexual Activity   Alcohol use: No   Drug use: No   Sexual activity: Not Currently    Birth control/protection: Post-menopausal

## 2022-08-03 ENCOUNTER — Telehealth: Payer: Self-pay

## 2022-08-03 DIAGNOSIS — N186 End stage renal disease: Secondary | ICD-10-CM | POA: Diagnosis not present

## 2022-08-03 DIAGNOSIS — M6259 Muscle wasting and atrophy, not elsewhere classified, multiple sites: Secondary | ICD-10-CM | POA: Diagnosis not present

## 2022-08-03 DIAGNOSIS — N2581 Secondary hyperparathyroidism of renal origin: Secondary | ICD-10-CM | POA: Diagnosis not present

## 2022-08-03 DIAGNOSIS — R2689 Other abnormalities of gait and mobility: Secondary | ICD-10-CM | POA: Diagnosis not present

## 2022-08-03 DIAGNOSIS — M6281 Muscle weakness (generalized): Secondary | ICD-10-CM | POA: Diagnosis not present

## 2022-08-03 DIAGNOSIS — Z741 Need for assistance with personal care: Secondary | ICD-10-CM | POA: Diagnosis not present

## 2022-08-03 DIAGNOSIS — Z992 Dependence on renal dialysis: Secondary | ICD-10-CM | POA: Diagnosis not present

## 2022-08-03 NOTE — Telephone Encounter (Signed)
Carolyn Russo w/[employer] called and said they are handling the prior auth for this pts upcoming graft and it is pending a denial with [employer] and P2P is an option. The P2P can be set up by calling [phone] and needs to be set up by 12 pm on 08/05/22. Case [id number].

## 2022-08-04 ENCOUNTER — Ambulatory Visit: Payer: Medicare PPO | Attending: Audiologist | Admitting: Audiologist

## 2022-08-04 ENCOUNTER — Inpatient Hospital Stay: Payer: Medicare PPO | Admitting: Nurse Practitioner

## 2022-08-04 ENCOUNTER — Telehealth: Payer: Self-pay

## 2022-08-04 ENCOUNTER — Other Ambulatory Visit: Payer: Self-pay | Admitting: Physical Medicine and Rehabilitation

## 2022-08-04 DIAGNOSIS — M6281 Muscle weakness (generalized): Secondary | ICD-10-CM | POA: Diagnosis not present

## 2022-08-04 DIAGNOSIS — Z741 Need for assistance with personal care: Secondary | ICD-10-CM | POA: Diagnosis not present

## 2022-08-04 DIAGNOSIS — M6259 Muscle wasting and atrophy, not elsewhere classified, multiple sites: Secondary | ICD-10-CM | POA: Diagnosis not present

## 2022-08-04 DIAGNOSIS — R2689 Other abnormalities of gait and mobility: Secondary | ICD-10-CM | POA: Diagnosis not present

## 2022-08-04 DIAGNOSIS — H903 Sensorineural hearing loss, bilateral: Secondary | ICD-10-CM | POA: Insufficient documentation

## 2022-08-04 NOTE — Telephone Encounter (Signed)
Pt's husband, Dr. Marylynn Pearson, called stating that the pt is now feeling pain in her toe and that is a new change in her symptoms. No other changes noted at this time.  Reviewed pt's chart, spoke to [place], [place], returned call for clarification, two identifiers used. Instructed pt to continue to monitor. If pt experiences severe pain, or other significant changes, Sam advised to possibly proceed with an angiogram. For now, pt will be monitored and call if symptoms worsens. Pt to keep 11/1 appt.

## 2022-08-04 NOTE — Telephone Encounter (Signed)
You will receive a call from [facility] on this pt for a peer to peer review for [employer] in office application . They will call your cell phone number sometime today just an [place]

## 2022-08-04 NOTE — Telephone Encounter (Signed)
Carolyn Russo called back and advised that she would send the acceptance for the P2P to the medical director gave Dr. Jess Barters cell number and they will contact to review the case. Did not give a time that they would call.

## 2022-08-04 NOTE — Telephone Encounter (Signed)
I called and lm on vm to advise that I am trying to set up P'@P'$  with Dr. Sharol Given today for this Carolyn Russo. To call and ask for me directly so that someone can get me to the phone to sch this today. Will hold abd try again.

## 2022-08-05 DIAGNOSIS — M6281 Muscle weakness (generalized): Secondary | ICD-10-CM | POA: Diagnosis not present

## 2022-08-05 DIAGNOSIS — R2689 Other abnormalities of gait and mobility: Secondary | ICD-10-CM | POA: Diagnosis not present

## 2022-08-05 DIAGNOSIS — M6259 Muscle wasting and atrophy, not elsewhere classified, multiple sites: Secondary | ICD-10-CM | POA: Diagnosis not present

## 2022-08-05 DIAGNOSIS — N186 End stage renal disease: Secondary | ICD-10-CM | POA: Diagnosis not present

## 2022-08-05 DIAGNOSIS — N2581 Secondary hyperparathyroidism of renal origin: Secondary | ICD-10-CM | POA: Diagnosis not present

## 2022-08-05 DIAGNOSIS — Z741 Need for assistance with personal care: Secondary | ICD-10-CM | POA: Diagnosis not present

## 2022-08-05 DIAGNOSIS — Z992 Dependence on renal dialysis: Secondary | ICD-10-CM | POA: Diagnosis not present

## 2022-08-06 DIAGNOSIS — Z741 Need for assistance with personal care: Secondary | ICD-10-CM | POA: Diagnosis not present

## 2022-08-06 DIAGNOSIS — M6259 Muscle wasting and atrophy, not elsewhere classified, multiple sites: Secondary | ICD-10-CM | POA: Diagnosis not present

## 2022-08-06 DIAGNOSIS — R2689 Other abnormalities of gait and mobility: Secondary | ICD-10-CM | POA: Diagnosis not present

## 2022-08-06 DIAGNOSIS — M6281 Muscle weakness (generalized): Secondary | ICD-10-CM | POA: Diagnosis not present

## 2022-08-08 DIAGNOSIS — Z992 Dependence on renal dialysis: Secondary | ICD-10-CM | POA: Diagnosis not present

## 2022-08-08 DIAGNOSIS — N2581 Secondary hyperparathyroidism of renal origin: Secondary | ICD-10-CM | POA: Diagnosis not present

## 2022-08-08 DIAGNOSIS — Z741 Need for assistance with personal care: Secondary | ICD-10-CM | POA: Diagnosis not present

## 2022-08-08 DIAGNOSIS — R2689 Other abnormalities of gait and mobility: Secondary | ICD-10-CM | POA: Diagnosis not present

## 2022-08-08 DIAGNOSIS — M6259 Muscle wasting and atrophy, not elsewhere classified, multiple sites: Secondary | ICD-10-CM | POA: Diagnosis not present

## 2022-08-08 DIAGNOSIS — M6281 Muscle weakness (generalized): Secondary | ICD-10-CM | POA: Diagnosis not present

## 2022-08-08 DIAGNOSIS — N186 End stage renal disease: Secondary | ICD-10-CM | POA: Diagnosis not present

## 2022-08-09 DIAGNOSIS — R2689 Other abnormalities of gait and mobility: Secondary | ICD-10-CM | POA: Diagnosis not present

## 2022-08-09 DIAGNOSIS — N186 End stage renal disease: Secondary | ICD-10-CM | POA: Diagnosis not present

## 2022-08-09 DIAGNOSIS — M6281 Muscle weakness (generalized): Secondary | ICD-10-CM | POA: Diagnosis not present

## 2022-08-09 DIAGNOSIS — I10 Essential (primary) hypertension: Secondary | ICD-10-CM | POA: Diagnosis not present

## 2022-08-09 DIAGNOSIS — Z741 Need for assistance with personal care: Secondary | ICD-10-CM | POA: Diagnosis not present

## 2022-08-09 DIAGNOSIS — E785 Hyperlipidemia, unspecified: Secondary | ICD-10-CM | POA: Diagnosis not present

## 2022-08-09 DIAGNOSIS — M6259 Muscle wasting and atrophy, not elsewhere classified, multiple sites: Secondary | ICD-10-CM | POA: Diagnosis not present

## 2022-08-09 DIAGNOSIS — Z4781 Encounter for orthopedic aftercare following surgical amputation: Secondary | ICD-10-CM | POA: Diagnosis not present

## 2022-08-10 DIAGNOSIS — E785 Hyperlipidemia, unspecified: Secondary | ICD-10-CM | POA: Diagnosis not present

## 2022-08-10 DIAGNOSIS — Z992 Dependence on renal dialysis: Secondary | ICD-10-CM | POA: Diagnosis not present

## 2022-08-10 DIAGNOSIS — R2689 Other abnormalities of gait and mobility: Secondary | ICD-10-CM | POA: Diagnosis not present

## 2022-08-10 DIAGNOSIS — I10 Essential (primary) hypertension: Secondary | ICD-10-CM | POA: Diagnosis not present

## 2022-08-10 DIAGNOSIS — N2581 Secondary hyperparathyroidism of renal origin: Secondary | ICD-10-CM | POA: Diagnosis not present

## 2022-08-10 DIAGNOSIS — F445 Conversion disorder with seizures or convulsions: Secondary | ICD-10-CM | POA: Diagnosis not present

## 2022-08-10 DIAGNOSIS — M6281 Muscle weakness (generalized): Secondary | ICD-10-CM | POA: Diagnosis not present

## 2022-08-10 DIAGNOSIS — M6259 Muscle wasting and atrophy, not elsewhere classified, multiple sites: Secondary | ICD-10-CM | POA: Diagnosis not present

## 2022-08-10 DIAGNOSIS — N186 End stage renal disease: Secondary | ICD-10-CM | POA: Diagnosis not present

## 2022-08-10 DIAGNOSIS — Z741 Need for assistance with personal care: Secondary | ICD-10-CM | POA: Diagnosis not present

## 2022-08-11 ENCOUNTER — Encounter: Payer: Self-pay | Admitting: Neurology

## 2022-08-11 ENCOUNTER — Ambulatory Visit: Payer: Medicare PPO | Admitting: Neurology

## 2022-08-11 ENCOUNTER — Telehealth: Payer: Self-pay

## 2022-08-11 VITALS — BP 164/85 | HR 67

## 2022-08-11 DIAGNOSIS — I6381 Other cerebral infarction due to occlusion or stenosis of small artery: Secondary | ICD-10-CM | POA: Diagnosis not present

## 2022-08-11 DIAGNOSIS — M6259 Muscle wasting and atrophy, not elsewhere classified, multiple sites: Secondary | ICD-10-CM | POA: Diagnosis not present

## 2022-08-11 DIAGNOSIS — G40909 Epilepsy, unspecified, not intractable, without status epilepticus: Secondary | ICD-10-CM

## 2022-08-11 DIAGNOSIS — G9340 Encephalopathy, unspecified: Secondary | ICD-10-CM

## 2022-08-11 DIAGNOSIS — Z741 Need for assistance with personal care: Secondary | ICD-10-CM | POA: Diagnosis not present

## 2022-08-11 DIAGNOSIS — R2689 Other abnormalities of gait and mobility: Secondary | ICD-10-CM | POA: Diagnosis not present

## 2022-08-11 DIAGNOSIS — M6281 Muscle weakness (generalized): Secondary | ICD-10-CM | POA: Diagnosis not present

## 2022-08-11 NOTE — Patient Instructions (Signed)
I had a long discussion with the patient and her husband regarding her recent hospitalization for the amputation followed by encephalopathy and confusion and hypoglycemia episodes seem to be making good recovery back to her baseline.  Reviewed results of imaging and answered questions.  Recommend she continue aspirin and Brilinta for stroke prevention and history of peripheral vascular disease .  Continue Keppra in the current dosage for seizure prophylaxis.  Continue ongoing physical occupational therapy.  Patient requested audiology consult for hearing loss  and I placed one.she will return for follow-up in the future as needed only.  Marland Kitchen

## 2022-08-11 NOTE — Progress Notes (Signed)
[facility] [address] [place]. [place] [address] [phone]       STROKE FOLLOW UP NOTE  Carolyn Russo Date of Birth:  10/23/1951 Medical Record Number:  [id number]   Reason for Referral: stroke follow up    SUBJECTIVE:   CHIEF COMPLAINT:  Chief Complaint  Patient presents with   New Patient (Initial Visit)    Rm 17    HPI:  Update 08/11/2022 : Patient is seen in follow-up today after last visit with Carolyn Russo nurse practitioner on 03/31/2022.  Patient was admitted to the hospital in July 23 for right below-knee amputation and hospital course was protracted and complicated by encephalopathy and confusion.  CT head showed no acute abnormalities.  Encephalopathy was felt to be multifactorial due to combination of postop state, general anesthesia, postop pain with using opioids hyperglycemia, hypoglycemia, tissue necrosis due to gangrene and extended illness with hospital hospitalization.  EEG showed periodic triphasic discharges but no definite seizures.  Her home dose of Keppra was continued. Neurology was reconsulted on 06/22/2022 due to sudden onset of lethargy and decreased mental status following a blood glucose of 62 while in rehab.  EEG on 06/05/2022 showed right temporoparietal spike with intermittent slowing maximal in the right temporoparietal region but no definite seizure.  Keppra dose was changed to 750 mg daily and 375 mg after dialysis.  Patient is presently living at [facility] skilled nursing facility.  She is getting physical and Occupational Therapy.  She can bear some weight and stand onto parallel bars with one-person assist.  She can assist with transfers from bed to chair and bed to commode.  The husband feels her mental status has now returned back to baseline however her affect is slightly flat.  She has had no recurrent stroke or TIA symptoms.  She can continues to have mild right hand weakness following her dialysis graft surgery as well as  some spastic left hemiparesis from a previous strokes.  She still has some small area of wound which has not healed completely at her amputation site in the right leg for which she is wearing a long bandage.  She has had no further seizure-like events.  Some mania so may do not show to have gonial wake up people  Update 03/31/2022 Carolyn Russo: Patient returns for 54-monthstroke follow-up accompanied by her caregiver KZigmund Russo Overall stable from stroke standpoint, denies new stroke/TIA symptoms Reports residual left-sided weakness which has been relatively stable since prior visit Completed therapies back in April, use of RW for ambulation, denies any recent falls Continues to closely follow with PMR  Compliant on Keppra and denies any recent seizure activity.  Denies side effects.  Compliant on Plavix and Crestor, denies side effects.  Blood pressure today elevated, routinely monitored at HD, typically more elevated on nondialysis days.  Continued RUE weakness and pain post fistula placement.  She continues to remain hesitant of removing fistula although it is not currently being used.  Pain currently being managed by PMR.  Continues to receive HD 3x weekly. Following with vascular surgery with recent right posterior tib artery stenting after arthrectomy for right great toe ulceration  No new neurological concerns today     History provided for reference purposes only Update 09/27/2021 Carolyn Russo: Returns for 322-monthtroke follow-up accompanied by her husband.  Overall stable from stroke standpoint -denies new stroke/TIA symptoms Residual mild left-sided weakness but overall greatly improving since prior visit.  Does have continued left shoulder pain with limited ROM.  Ambulates short distance with RW -denies any recent falls.  Receives aide assistance 5x/week while husband at works - he cares for her in the evenings/nights and on weekends. Completed HH PT/OT last month- currently waiting to schedule PT/OT neuro  rehab Remains on Plavix and Crestor -denies side effects Blood pressure today 150/70  Compliant on Keppra -denies side effects.  No seizure activity  Continued right hand weakness and sensory impairment with dysesthesias s/p fistula placement. Does report some improvement since prior visit. Plans on trying mix of lidocaine and voltaren gel as recommended by PMR at recent visit. Followed by [facility] who recommended taking fistula back down but she is still hesitant to undergo surgery.  Currently waiting to be scheduled for second opinion  Continued HD thrice weekly - c/o nausea during sessions   No new concerns at this time  Initial visit 06/21/2021 Carolyn Russo: Carolyn Russo is being seen for hospital follow-up accompanied by her husband, Carolyn Russo.  Overall doing well.  Reports improvement of left-sided weakness working with [facility] and just started OT last week. She does c/o left shoulder pain with limited ROM. Ambulates short distance with RW. Does have aide assistance for ADLs.  Greatest concern today is in regards to right hand weakness s/p fistula placement. Seen by [facility] 8/17 again offering medication of the fistula and then planning new access in the future but she remains hesitant at this time to undergo any further surgical interventions. Recommended hand therapy for possible improvement.  Denies new stroke/TIA symptoms.  Remains on aspirin and Plavix as well as Crestor without side effects.  Blood pressure today 144/80. Remains on keppra 564m nightly without seizure activity and tolerating without side effects.  Continued HD thrice weekly.  No further concerns at this time.  Stroke admission 04/16/2021 Ms. Carolyn FSHAINDY READERis a 71y.o. female with PMHx of obesity, DM, HTN, HLD, ESRD on HD who presented with left sided weakness for 3 days on 04/16/2021. Personally reviewed hospitalization pertinent progress notes, lab work and imaging. Evaluated by Dr. XErlinda Hongfor subcortical infarct on the right, likely secondary to small  vessel disease. LDL 70. A1c 5.2. MRA head/neck unremarkable. EF 50% on 02/2021. on aspirin PTA and recommended DAPT for 3 weeks then Plavix alone. Continue Coreg, Crestor 253mdaily and Novolog. Other stroke risk factors include advanced age and obesity. Other active problems include ESRD on HD, chronic hip pain, asthma and GERD. Residual deficits of left hemiparesis. Eval by therapies recommended CIR and dc'd to CIR on 04/20/2021. Initally recovering well, however, on 7/13 she was found to have AMS, worsening left-sided weakness, left hemineglect and right gaze deviation after HD session. MR brain and CT head no evidence of acute infarct.  EEG showed right central parietal region spikes, consistent with potential epileptogenic focus. Started on Keppra 50070mightly for likely seizure with postictal state. Underwent right basilic vein fistula placement by vascular surgery on 7/8 and subsequently developed right hand numbness and weakness.  Evaluated by neurology Dr. 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 VVS recommended ligation of fistula but pt declined. She returned back to CIR on on 7/15 and dc'd home on 8/4.     Pertinent imaging  MR BRAIN 04/16/2021 IMPRESSION: 1. 2.1 cm acute ischemic nonhemorrhagic right basal ganglia infarct. 2. Underlying moderate chronic microvascular ischemic disease.  MR ANGIO HEAD WO CONTRAST MR ANGIO NECK WO CONTRAST 04/17/2021 IMPRESSION: 1. Technically limited exam due to extensive motion artifact. 2. Grossly negative MRA of  the head and neck. No large vessel occlusion. No proximal high-grade or correctable stenosis.  CT HEAD 05/03/2021 IMPRESSION: No acute CT finding. Subacute infarction in the right lateral thalamus/radiating white matter tracts is more clearly visible but there is no sign of extension or hemorrhage. Chronic small-vessel ischemic changes elsewhere affecting the cerebral hemispheric  white matter.  CT HEAD 05/05/2021 IMPRESSION: 1. No acute intracranial pathology. 2. Age-related atrophy and chronic microvascular ischemic changes. Similar appearance of focal area of old infarct lateral to the right thalamus.  CTA HEAD/NECK 05/05/2021 CT PERFUSION IMPRESSION: 1. Negative CTA for emergent large vessel occlusion. 2. Mild atheromatous change about the major arterial vasculature of the head and neck as above. No proximal high-grade or correctable stenosis. 3. 2 mm focal outpouching arising from the proximal cavernous right ICA, which could reflect a small aneurysm versus vascular infundibulum. Attention at follow-up recommended. 4. CT perfusion portion of this exam unfortunately failed due to technical difficulty. No perfusion maps were generated.  MR BRAIN 05/06/2021 IMPRESSION: No acute infarction. Evolving prior infarction of the right corona radiata and basal ganglia. Chronic microvascular ischemic changes.  EEG 05/06/2021 IMPRESSION: This study showed evidence of potential epileptogenicity arising from right centro-parietal region. There is also cortical dysfunction arising from right hemisphere likely secondary to underlying stroke. Additionally, there is mild diffuse encephalopathy, nonspecific etiology. No seizures were seen throughout the recording.      ROS:   14 system review of systems performed and negative with exception of those listed in HPI  PMH:  Past Medical History:  Diagnosis Date   Acute GI bleeding    Allergy    Anemia    Anterior chest wall pain    Appendicitis 1965   Asthma    Body mass index 37.0-37.9, adult    Breast pain    Cataract    both eyes   CHF (congestive heart failure) ([place])    Chronic kidney disease    stage 5 - on dialysis   Cognitive change 04/20/2021   r/t cva 04/6810   Complication of anesthesia    Memory loss after general   Dehydration 2014   Deviated septum 1971   Diabetes mellitus    Dysphagia due  to old stroke    easy to get strangled when eating   Dyspnea 2014   Extrinsic asthma    WITH ASTHMA ATTACK   Fibroid 1980   GERD (gastroesophageal reflux disease)    Heart murmur    History of migraine    History of seizure    with stroke   Hx gestational diabetes    Hyperlipidemia    Hypertension 2014   Inguinal hernia 1959   Malaise and fatigue 2014   Non-IgE mediated allergic asthma 2014   Obesity    Pelvic pain    Pregnancy, high-risk 1985   Stroke ([place]) 04/20/2021   (CVA) of right basal ganglia   Tonsillitis 1968   Uterine fibroid 1980   Visual field defect    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    PSH:  Past Surgical History:  Procedure Laterality Date   ABDOMINAL AORTOGRAM W/LOWER EXTREMITY N/A 02/21/2022   Procedure: ABDOMINAL AORTOGRAM W/LOWER EXTREMITY;  Surgeon: Waynetta Sandy, MD;  Location: [facility];  Service: Cardiovascular;  Laterality: N/A;   AMPUTATION Right 05/18/2022   Procedure: RIGHT BELOW KNEE AMPUTATION;  Surgeon: Newt Minion, MD;  Location: [facility];  Service: Orthopedics;  Laterality: Right;   AMPUTATION TOE Right 04/15/2022   Procedure:  AMPUTATION  LST TOERIGHT FOOT;  Surgeon: Criselda Peaches, DPM;  Location: [facility];  Service: Podiatry;  Laterality: Right;   APPENDECTOMY  0923   BASCILIC VEIN TRANSPOSITION Right 04/30/2021   Procedure: RIGHT FIRST STAGE [place];  Surgeon: Angelia Mould, MD;  Location: [facility];  Service: Vascular;  Laterality: Right;   CESAREAN SECTION  1985   COLONOSCOPY     ESOPHAGOGASTRODUODENOSCOPY (EGD) WITH PROPOFOL N/A 04/18/2021   Procedure: ESOPHAGOGASTRODUODENOSCOPY (EGD) WITH PROPOFOL;  Surgeon: Gatha Mayer, MD;  Location: [facility];  Service: Endoscopy;  Laterality: N/A;   EYE SURGERY     bilateral cataract    FLEXIBLE SIGMOIDOSCOPY N/A 04/18/2021   Procedure: FLEXIBLE SIGMOIDOSCOPY;  Surgeon: Gatha Mayer, MD;  Location: [facility];  Service: Endoscopy;  Laterality: N/A;    HERNIA REPAIR  1959   IR FLUORO GUIDE CV LINE RIGHT  03/18/2021   IR US GUIDE VASC ACCESS RIGHT  03/18/2021   LEFT HEART CATH AND CORONARY ANGIOGRAPHY N/A 04/04/2017   Procedure: Left Heart Cath and Coronary Angiography;  Surgeon: Belva Crome, MD;  Location: [facility];  Service: Cardiovascular;  Laterality: N/A;   [place], 2004, 2007   PERIPHERAL VASCULAR THROMBECTOMY  02/21/2022   Procedure: PERIPHERAL VASCULAR THROMBECTOMY;  Surgeon: Waynetta Sandy, MD;  Location: [facility];  Service: Cardiovascular;;  Right Tibial   RHINOPLASTY  1971   ROTATOR CUFF REPAIR  2003   SURGICAL REPAIR OF HEMORRHAGE  2015   TONSILLECTOMY  1968    Social History:  Social History   Socioeconomic History   Marital status: Married    Spouse name: Carolyn Russo   Number of children: 1   Years of education: Not on file   Highest education level: Professional school degree (e.g., MD, DDS, DVM, JD)  Occupational History   Occupation: retired  Tobacco Use   Smoking status: Never   Smokeless tobacco: Never  Vaping Use   Vaping Use: Never used  Substance and Sexual Activity   Alcohol use: No   Drug use: No   Sexual activity: Not Currently    Birth control/protection: Post-menopausal  Other Topics Concern   Not on file  Social History Narrative   06/21/21 lives with husband Dr Marylynn Pearson, caregiver- Carolyn Russo   Patient reports she used to walk at [employer] but now due to Covid-19 she is staying inside.   Social Determinants of Health   Financial Resource Strain: Low Risk  (09/02/2021)   Overall Financial Resource Strain (CARDIA)    Difficulty of Paying Living Expenses: Not hard at all  Food Insecurity: No Food Insecurity (09/02/2021)   Hunger Vital Sign    Worried About Running Out of Food in the Last Year: Never true    Ran Out of Food in the Last Year: Never true  Transportation Needs: No Transportation Needs (09/02/2021)   PRAPARE - Hydrologist  (Medical): No    Lack of Transportation (Non-Medical): No  Physical Activity: Inactive (09/02/2021)   Exercise Vital Sign    Days of Exercise per Week: 0 days    Minutes of Exercise per Session: 0 min  Stress: Stress Concern Present (09/02/2021)   [place]    Feeling of Stress : To some extent  Social Connections: Socially Integrated (01/09/2019)   Social Connection and Isolation Panel [NHANES]    Frequency of Communication with Friends and Family: More than  three times a week    Frequency of Social Gatherings with Friends and Family: More than three times a week    Attends Religious Services: More than 4 times per year    Active Member of [employer] or Organizations: Yes    Attends Music therapist: More than 4 times per year    Marital Status: Married  Human resources officer Violence: Not At Risk (03/27/2019)   Humiliation, Afraid, Rape, and Kick questionnaire    Fear of Current or Ex-Partner: No    Emotionally Abused: No    Physically Abused: No    Sexually Abused: No    Family History:  Family History  Problem Relation Age of Onset   Cancer Mother        abdominal melamona   Psoriasis Mother    Alzheimer's disease Father    Cancer Cousin        colon    Diabetes Cousin    Diabetes Maternal Aunt    Colon cancer Neg Hx    Colon polyps Neg Hx    Esophageal cancer Neg Hx    Rectal cancer Neg Hx    Stomach cancer Neg Hx    Breast cancer Neg Hx     Medications:   Current Outpatient Medications on File Prior to Visit  Medication Sig Dispense Refill   acetaminophen (TYLENOL) 500 MG tablet Take 1,000 mg by mouth 2 (two) times daily as needed (leg and arm pain).     Amino Acids-Protein Hydrolys (PRO-STAT) LIQD Take 30 mLs by mouth 2 (two) times daily.     aspirin EC 81 MG tablet Take 1 tablet (81 mg total) by mouth daily. Swallow whole. 30 tablet 12   carvedilol (COREG) 3.125 MG tablet Take 1 tablet (3.125  mg total) by mouth 2 (two) times daily with a meal. 180 tablet 1   cephALEXin (KEFLEX) 500 MG capsule Take 500 mg by mouth in the morning and at bedtime.     collagenase (SANTYL) 250 UNIT/GM ointment Apply 1 Application topically daily. 30 g 0   diclofenac Sodium (VOLTAREN) 1 % GEL Apply 2 g topically 4 (four) times daily. To left wrist 150 g 0   Glucagon, rDNA, (GLUCAGON EMERGENCY) 1 MG KIT Inject 1 mg into the vein as needed (CBG of 64m/dL).     insulin aspart (NOVOLOG) 100 UNIT/ML injection Inject 0-6 Units into the skin 3 (three) times daily with meals. CBG < 70: Implement Hypoglycemia Standing Orders and refer to Hypoglycemia Standing Orders sidebar report CBG 70 - 120: 0 units CBG 121 - 150: 0 units CBG 151 - 200: 1 unit CBG 201-250: 2 units CBG 251-300: 3 units CBG 301-350: 4 units CBG 351-400: 5 units CBG > 400: Give 6 units and call MD     insulin glargine (LANTUS) 100 UNIT/ML Solostar Pen Inject 5 Units into the skin at bedtime. 15 mL    levETIRAcetam (KEPPRA) 250 MG tablet Take 1.5 tablets (375 mg total) by mouth every Monday, Wednesday, and Friday at 6 PM.     levETIRAcetam (KEPPRA) 750 MG tablet Take 1 tablet (750 mg total) by mouth at bedtime.     methylphenidate (RITALIN) 5 MG tablet Take 1 tablet (5 mg total) by mouth 2 (two) times daily with breakfast and lunch. Due to stroke attention (Patient taking differently: Take 5 mg by mouth 2 (two) times daily with breakfast and lunch.) 180 tablet 0   midodrine (PROAMATINE) 10 MG tablet Take 1 tablet (10 mg total)  by mouth every Monday, Wednesday, and Friday with hemodialysis.     mometasone-formoterol (DULERA) 200-5 MCG/ACT AERO Inhale 2 puffs into the lungs 2 (two) times daily. 3 each 2   ondansetron (ZOFRAN-ODT) 4 MG disintegrating tablet Take 1 tablet (4 mg total) by mouth every 8 (eight) hours as needed for nausea or vomiting. 20 tablet 1   pantoprazole (PROTONIX) 40 MG tablet Take 1 tablet (40 mg total) by mouth daily. (Patient  taking differently: Take 40 mg by mouth at bedtime.) 90 tablet 2   polyethylene glycol (MIRALAX / GLYCOLAX) 17 g packet Take 17 g by mouth daily as needed for moderate constipation.  0   rosuvastatin (CRESTOR) 20 MG tablet Take 1 tablet (20 mg total) by mouth daily. (Patient taking differently: Take 20 mg by mouth at bedtime.) 90 tablet 2   senna-docusate (SENOKOT-S) 8.6-50 MG tablet Take 2 tablets by mouth 2 (two) times daily. 360 tablet 2   sevelamer carbonate (RENVELA) 0.8 g PACK packet Take 0.8 g by mouth 3 (three) times daily.     thiamine (VITAMIN B-1) 100 MG tablet Take 1 tablet (100 mg total) by mouth daily. 30 tablet 1   ticagrelor (BRILINTA) 90 MG TABS tablet Take 1 tablet (90 mg total) by mouth 2 (two) times daily. 60 tablet 3   [DISCONTINUED] iron polysaccharides (NIFEREX) 150 MG capsule Take 1 capsule (150 mg total) by mouth daily. (Patient not taking: No sig reported) 30 capsule 12   No current facility-administered medications on file prior to visit.    Allergies:   Allergies  Allergen Reactions   Food Anaphylaxis    Peanuts - anaphylaxis Almonds - anaphylaxis Not listed on MAR   Wheat Bran Other (See Comments)    Upset stomach  Not listed on MAR   Statins Itching and Other (See Comments)    Generalized aches- tolerates crestor  Not listed on MAR   Gadolinium Derivatives Other (See Comments)    Gadolinium-Containing Contrast Media Listed as an allergy on MAR Unknown reaction   Januvia [Sitagliptin] Other (See Comments)    Listed as an allergy on MAR Unknown reaction   Pork-Derived Products Other (See Comments)    Does not eat pork  Listed on MAR   Shellfish Allergy Other (See Comments)    Mouth gets raw Listed on MAR   Tetracycline Other (See Comments)    Raw mouth Not listed on MAR      OBJECTIVE:  Physical Exam  Vitals:   08/11/22 0826  BP: (!) 164/85  Pulse: 67    There is no height or weight on file to calculate BMI. No results  found.  General: well developed, well nourished, pleasant elderly African-American female, seated, in no evident distress Head: head normocephalic and atraumatic.   Neck: supple with no carotid or supraclavicular bruits Cardiovascular: regular rate and rhythm, no murmurs Musculoskeletal: right hand weakness with increased tone s/p fistula placement Skin:  no rash/petichiae Vascular:  Normal pulses all extremities   Neurologic Exam Mental Status: Awake and fully alert. Fluent speech and language. Oriented to place and time. Recent and remote memory intact. Attention span, concentration and fund of knowledge appropriate. Mood and affect appropriate and cooperative with exam.  Cranial Nerves: Pupils equal, briskly reactive to light. Extraocular movements full without nystagmus. Visual fields full to confrontation testing. Hearing intact. Facial sensation intact.  Left nasolabial fold flattening.  Tongue, palate moves normally and symmetrically.  Motor: LUE: 4-/5 deltoid, 4+/5 elbow flexion and extension, 4/5 grip  strength. LLE: 4+/5 hip flexor weakness; RUE: 3+/5 grip strength s/p fistula placement; RLE: 5/5.  Mild weakness of right hand grip and intrinsic hand muscles. Sensory.: intact to touch , pinprick , position and vibratory sensation.  Coordination: Rapid alternating movements decreased in upper extremities bilaterally. Finger-to-nose difficulty performing upper extremities and heel-to-shin performed accurately bilaterally. Gait and Station: Deferred as patient in wheelchair with right below-knee amputation and bandage  reflexes: 1+ and symmetric except cannot check in right lower extremity.. Toes downgoing.         ASSESSMENT: Carolyn Russo is a 71 y.o. year old female with R BG/CR infarct secondary to small vessel disease on 04/16/2021 after presenting with 3 days of left sided weakness. Possible seizure on 7/13 /22 for AMS after HD session.  Vascular risk factors include HTN, HLD, DM,  ESRD on HD. Recent consultation for encephalopathy in July 2023 following knee amputation which was felt to be multifactorial when she was found to have silent basal ganglia infarct on the left as well as right parietal tiny infarct.  Second admission for encephalopathy likely due to hypoglycemia in August 2023    PLAN: I had a long discussion with the patient and her husband regarding her recent hospitalization for the amputation followed by encephalopathy and confusion and hypoglycemia episodes seem to be making good recovery back to her baseline.  Reviewed results of imaging and answered questions.  Recommend she continue aspirin and Brilinta for stroke prevention and history of peripheral vascular disease .  Continue Keppra in the current dosage for seizure prophylaxis.  Continue ongoing physical occupational therapy.  Patient requested audiology consult for hearing loss  and I placed one.She will return for follow-up in the future as needed only. I spent 45 minutes of face-to-face and non-face-to-face time with patient and caregiver.  This included previsit chart review, lab review, study review, electronic health record documentation, patient and caregiver education regarding prior stroke with residual deficits, secondary stroke prevention measures and importance of managing stroke risk factors, right arm weakness s/p fistula placement and answered all questions to patient and caregivers satisfaction  Antony Contras, [facility] [address], [place] [address]  Phone [phone] Fax [phone] Note: This document was prepared with digital dictation and possible smart phrase technology. Any transcriptional errors that result from this process are unintentional.

## 2022-08-11 NOTE — Telephone Encounter (Signed)
Patient will be discharged from rehab on 08/12/2022. Spoke with patients husband he reports he will follow up with [employer], Dr Deatra James. Patient husband aware, care with Dr Baird Cancer will be discontinued.

## 2022-08-12 DIAGNOSIS — N186 End stage renal disease: Secondary | ICD-10-CM | POA: Diagnosis not present

## 2022-08-12 DIAGNOSIS — Z992 Dependence on renal dialysis: Secondary | ICD-10-CM | POA: Diagnosis not present

## 2022-08-12 DIAGNOSIS — N2581 Secondary hyperparathyroidism of renal origin: Secondary | ICD-10-CM | POA: Diagnosis not present

## 2022-08-13 ENCOUNTER — Encounter: Payer: Self-pay | Admitting: Orthopedic Surgery

## 2022-08-14 ENCOUNTER — Other Ambulatory Visit: Payer: Self-pay | Admitting: Surgical

## 2022-08-14 MED ORDER — SANTYL 250 UNIT/GM EX OINT: 1.0000 | TOPICAL_OINTMENT | Freq: Every day | CUTANEOUS | 0 refills | Status: DC

## 2022-08-15 ENCOUNTER — Ambulatory Visit: Payer: Medicare PPO | Admitting: Orthopedic Surgery

## 2022-08-15 ENCOUNTER — Other Ambulatory Visit: Payer: Self-pay | Admitting: Orthopedic Surgery

## 2022-08-15 DIAGNOSIS — N186 End stage renal disease: Secondary | ICD-10-CM | POA: Diagnosis not present

## 2022-08-15 DIAGNOSIS — Z992 Dependence on renal dialysis: Secondary | ICD-10-CM | POA: Diagnosis not present

## 2022-08-15 DIAGNOSIS — N2581 Secondary hyperparathyroidism of renal origin: Secondary | ICD-10-CM | POA: Diagnosis not present

## 2022-08-15 MED ORDER — COLLAGENASE 250 UNIT/GM EX OINT: 1.0000 | TOPICAL_OINTMENT | Freq: Every day | CUTANEOUS | 3 refills | Status: DC

## 2022-08-16 ENCOUNTER — Ambulatory Visit (INDEPENDENT_AMBULATORY_CARE_PROVIDER_SITE_OTHER): Payer: Medicare PPO | Admitting: Orthopedic Surgery

## 2022-08-16 ENCOUNTER — Inpatient Hospital Stay: Payer: Medicare PPO | Admitting: Internal Medicine

## 2022-08-16 ENCOUNTER — Encounter: Payer: Self-pay | Admitting: Orthopedic Surgery

## 2022-08-16 DIAGNOSIS — T8781 Dehiscence of amputation stump: Secondary | ICD-10-CM | POA: Diagnosis not present

## 2022-08-16 DIAGNOSIS — L97912 Non-pressure chronic ulcer of unspecified part of right lower leg with fat layer exposed: Secondary | ICD-10-CM | POA: Diagnosis not present

## 2022-08-16 NOTE — Progress Notes (Signed)
Office Visit Note   Patient: Carolyn Russo           Date of Birth: [birth year]           MRN: [id number] Visit Date: 08/16/2022              Requested by: [name], MD [address] [place],  [place] [address] PCP: Pcp, No  Chief Complaint  Patient presents with   Right Leg - Routine Post Op    05/18/22 right BKA In office approved kerecis graft application      HPI: Patient is a 71 year old woman who is seen in follow-up for stump dehiscence right transtibial amputation.  Patient denies any systemic symptoms.  Assessment & Plan: Visit Diagnoses:  1. Ulcer of right lower extremity with fat layer exposed ([facility])   2. Dehiscence of amputation stump (HCC)     Plan: Ulcer was debrided.  Kerecis micro powder was applied dry dressing and compression.  Patient will start dry dressing changes tomorrow with leaving the Adaptic in place and changing the gauze and the Ace wrap on top.  Follow-up in 1 week for evaluation of incorporation and possible need for additional tissue graft.  Follow-Up Instructions: Return in about 1 week (around 08/23/2022).   Ortho Exam  Patient is alert, oriented, no adenopathy, well-dressed, normal affect, normal respiratory effort. Examination patient has dehiscence anterior laterally of the transtibial amputation on the right.  After informed consent a 10 blade knife was used to debride nonviable tissue back to healthy viable bleeding margins.  The Kerecis micro powder was applied and a compression dressing.  Patient tolerated this well.  The wound healing has stalled, the wound bed has healthy granulation tissue, and patient presents for evaluation and application of [place] Micro graft. After informed consent a 10 blade knife was used to debride the skin and soft tissue to healthy viable bleeding granulation tissue.  Silver nitrate was used for hemostasis. The wound measures: 5 cm in length, 5cm  in width, 20 mm in depth, wound  location anterior lateral right below knee amputation Kerecis MariGen micro tissue graft 38 cm2 was applied, and there was no wastage.  Please see the photo below of the Lot number and expiration date. The micro tissue graft was covered with a nonadherent Adaptic dressing, bolstered with 4 x 4 gauze and secured with a compression wrap.     Imaging: No results found.    Labs: Lab Results  Component Value Date   HGBA1C 4.6 (L) 06/04/2022   HGBA1C 4.7 (L) 06/03/2022   HGBA1C 5.9 (H) 04/15/2022   ESRSEDRATE 56 (H) 05/10/2022   CRP 9.4 (H) 05/10/2022   REPTSTATUS 06/27/2022 FINAL 06/22/2022   CULT  06/22/2022    NO GROWTH 5 DAYS Performed at [facility], [place] [address], [place], [place] [address]      Lab Results  Component Value Date   ALBUMIN 2.8 (L) 06/29/2022   ALBUMIN 2.9 (L) 06/27/2022   ALBUMIN 2.7 (L) 06/25/2022    Lab Results  Component Value Date   MG 1.9 06/10/2022   MG 1.8 06/08/2022   MG 1.8 06/06/2022   Lab Results  Component Value Date   VD25OH 37 10/13/2011    No results found for: "PREALBUMIN"    Latest Ref Rng & Units 06/29/2022    7:20 AM 06/27/2022    3:42 AM 06/25/2022    7:51 AM  CBC EXTENDED  WBC 4.0 - 10.5 K/uL  13.4  13.7  10.9   RBC 3.87 - 5.11 MIL/uL 2.99  3.00  3.22   Hemoglobin 12.0 - 15.0 g/dL 9.7  9.7  10.2   HCT 36.0 - 46.0 % 27.9  27.8  30.2   Platelets 150 - 400 K/uL 358  364  322      There is no height or weight on file to calculate BMI.  Orders:  No orders of the defined types were placed in this encounter.  No orders of the defined types were placed in this encounter.    Procedures: No procedures performed  Clinical Data: No additional findings.  ROS:  All other systems negative, except as noted in the HPI. Review of Systems  Objective: Vital Signs: There were no vitals taken for this visit.  Specialty Comments:  No specialty comments available.  PMFS History: Patient Active Problem List    Diagnosis Date Noted   Urinary tract infection without hematuria    Diabetes mellitus due to underlying condition with hypoglycemia without coma, with long-term current use of insulin (HCC)    Acute encephalopathy 06/22/2022   Acute respiratory failure ([place])    Unresponsiveness    Pressure ulcer 06/07/2022   Acute stroke due to ischemia ([place]) 06/03/2022   Chest pain 06/01/2022   History of CVA (cerebrovascular accident) 06/01/2022   Seizure ([place])    Encephalopathy    Below-knee amputation of right lower extremity ([place])    Leukocytosis    Foot infection 05/10/2022   PAD (peripheral artery disease) ([place]) 05/10/2022   Gangrene of right foot ([place]) 04/15/2022   Left hemiparesis ([place]) 09/24/2021   Abnormality of gait 09/24/2021   Pain of right hand 08/06/2021   Steal syndrome of dialysis vascular access ([place]) 08/06/2021   Subclavian steal syndrome of right subclavian artery 06/25/2021   Right hand weakness 06/25/2021   Stroke-like symptoms 05/06/2021   Bandemia    Cerebrovascular accident (CVA) of right basal ganglia ([place]) 04/20/2021   External hemorrhoids    Basal ganglia infarction ([place]) 04/16/2021   ESRD on dialysis ([place]) 04/16/2021   Hypertensive urgency 03/10/2021   Hilar enlargement    Anemia of chronic disease    Chronic diastolic CHF (congestive heart failure) ([place]) 03/09/2021   CKD (chronic kidney disease), stage V ([place]) 03/08/2021   Diabetic retinopathy ([place]) 03/08/2021   Hyperglycemia due to type 2 diabetes mellitus ([place]) 03/08/2021   Pure hypercholesterolemia 03/08/2021   Anemia in chronic kidney disease 09/24/2020   Uncontrolled type 2 diabetes mellitus with hyperglycemia ([place]) 03/19/2019   Osteopenia 09/04/2018   Migraine 09/04/2018   Asthma 09/04/2018   Pseudophakia of both eyes 07/17/2018   Pseudophakia of left eye 07/03/2018   Open angle with borderline findings and high glaucoma risk in left eye 07/03/2018   Age-related nuclear cataract of right eye 07/03/2018    CAD (coronary artery disease), native coronary artery 04/27/2017   Abnormal nuclear stress test 04/04/2017   Shortness of breath 03/09/2017   Appendicitis    Age-related hypermature cataract of both eyes 12/20/2016   Uterine leiomyoma 11/27/2012   Dyslipidemia (high LDL; low HDL) 11/25/2011   Obesity (BMI 30-39.9) 11/25/2011   Hypertensive disorder 11/21/2011   Type 2 diabetes mellitus ([place]) 11/21/2011   Abnormal EKG 11/21/2011   Past Medical History:  Diagnosis Date   Acute GI bleeding    Allergy    Anemia    Anterior chest wall pain    Appendicitis 1965   Asthma    Body mass index  37.0-37.9, adult    Breast pain    Cataract    both eyes   CHF (congestive heart failure) ([place])    Chronic kidney disease    stage 5 - on dialysis   Cognitive change 04/20/2021   r/t cva 11/353   Complication of anesthesia    Memory loss after general   Dehydration 2014   Deviated septum 1971   Diabetes mellitus    Dysphagia due to old stroke    easy to get strangled when eating   Dyspnea 2014   Extrinsic asthma    WITH ASTHMA ATTACK   Fibroid 1980   GERD (gastroesophageal reflux disease)    Heart murmur    History of migraine    History of seizure    with stroke   Hx gestational diabetes    Hyperlipidemia    Hypertension 2014   Inguinal hernia 1959   Malaise and fatigue 2014   Non-IgE mediated allergic asthma 2014   Obesity    Pelvic pain    Pregnancy, high-risk 1985   Stroke ([place]) 04/20/2021   (CVA) of right basal ganglia   Tonsillitis 1968   Uterine fibroid 1980   Visual field defect    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    Family History  Problem Relation Age of Onset   Cancer Mother        abdominal melamona   Psoriasis Mother    Alzheimer's disease Father    Cancer Cousin        colon    Diabetes Cousin    Diabetes Maternal Aunt    Colon cancer Neg Hx    Colon polyps Neg Hx    Esophageal cancer Neg Hx    Rectal cancer Neg Hx    Stomach cancer Neg Hx    Breast cancer  Neg Hx     Past Surgical History:  Procedure Laterality Date   ABDOMINAL AORTOGRAM W/LOWER EXTREMITY N/A 02/21/2022   Procedure: ABDOMINAL AORTOGRAM W/LOWER EXTREMITY;  Surgeon: Waynetta Sandy, MD;  Location: [name];  Service: Cardiovascular;  Laterality: N/A;   AMPUTATION Right 05/18/2022   Procedure: RIGHT BELOW KNEE AMPUTATION;  Surgeon: Newt Minion, MD;  Location: [facility];  Service: Orthopedics;  Laterality: Right;   AMPUTATION TOE Right 04/15/2022   Procedure: AMPUTATION  LST TOERIGHT FOOT;  Surgeon: Criselda Peaches, DPM;  Location: [facility];  Service: Podiatry;  Laterality: Right;   APPENDECTOMY  9741   BASCILIC VEIN TRANSPOSITION Right 04/30/2021   Procedure: RIGHT FIRST STAGE [place];  Surgeon: Angelia Mould, MD;  Location: [facility];  Service: Vascular;  Laterality: Right;   CESAREAN SECTION  1985   COLONOSCOPY     ESOPHAGOGASTRODUODENOSCOPY (EGD) WITH PROPOFOL N/A 04/18/2021   Procedure: ESOPHAGOGASTRODUODENOSCOPY (EGD) WITH PROPOFOL;  Surgeon: Gatha Mayer, MD;  Location: [facility];  Service: Endoscopy;  Laterality: N/A;   EYE SURGERY     bilateral cataract    FLEXIBLE SIGMOIDOSCOPY N/A 04/18/2021   Procedure: FLEXIBLE SIGMOIDOSCOPY;  Surgeon: Gatha Mayer, MD;  Location[facility];  Service: Endoscopy;  Laterality: N/A;   HERNIA REPAIR  1959   IR FLUORO GUIDE CV LINE RIGHT  03/18/2021   IR US GUIDE VASC ACCESS RIGHT  03/18/2021   LEFT HEART CATH AND CORONARY ANGIOGRAPHY N/A 04/04/2017   Procedure: Left Heart Cath and Coronary Angiography;  Surgeon: Belva Crome, MD;  Location: [facility];  Service: Cardiovascular;  Laterality: N/A;   MYOMECTOMY  1980, 2004, 2007   PERIPHERAL VASCULAR THROMBECTOMY  02/21/2022   Procedure: PERIPHERAL VASCULAR THROMBECTOMY;  Surgeon: Waynetta Sandy, MD;  Location: [facility];  Service: Cardiovascular;;  Right Tibial   RHINOPLASTY  1971   ROTATOR CUFF REPAIR  2003    SURGICAL REPAIR OF HEMORRHAGE  2015   TONSILLECTOMY  1968   Social History   Occupational History   Occupation: retired  Tobacco Use   Smoking status: Never   Smokeless tobacco: Never  Vaping Use   Vaping Use: Never used  Substance and Sexual Activity   Alcohol use: No   Drug use: No   Sexual activity: Not Currently    Birth control/protection: Post-menopausal

## 2022-08-17 ENCOUNTER — Ambulatory Visit: Payer: Medicare PPO | Admitting: Audiologist

## 2022-08-17 DIAGNOSIS — H903 Sensorineural hearing loss, bilateral: Secondary | ICD-10-CM

## 2022-08-17 DIAGNOSIS — N2581 Secondary hyperparathyroidism of renal origin: Secondary | ICD-10-CM | POA: Diagnosis not present

## 2022-08-17 DIAGNOSIS — Z992 Dependence on renal dialysis: Secondary | ICD-10-CM | POA: Diagnosis not present

## 2022-08-17 DIAGNOSIS — N186 End stage renal disease: Secondary | ICD-10-CM | POA: Diagnosis not present

## 2022-08-17 NOTE — Procedures (Signed)
  Outpatient Audiology and [place], [place]  [id number] [phone]  AUDIOLOGICAL  EVALUATION  NAME: GENIECE AKERS     DOB:   10/02/1951      MRN: [id number]                                                                                     DATE: 08/17/2022     REFERENT: [name], MD  STATUS: Outpatient DIAGNOSIS: Sensorineural Hearing Loss    History: Lacy was seen for an audiological evaluation. Hazel was accompanied to the appointment by her son.  Rebbie is receiving a hearing evaluation due to concerns for significant hearing loss. Kimberlyn has difficulty hearing in both ears, she feels her right ear is worse. This difficulty began gradually. Her son first noticed Makinley was struggling to hear when he was in high school in 2007. No pain or pressure reported in either ear. Tinnitus denied for both ears. Medical history positive for uncontrolled diabetes, kidney disease, and stroke which is are all risk factors for hearing loss. No other relevant case history reported.   Evaluation:  Otoscopy showed a clear view of the tympanic membranes, bilaterally Tympanometry results were consistent with normal middle ear pressure bilaterally with a shallow response in the right ear  Audiometric testing was completed using conventional audiometry with supraural transducer. Speech Recognition Thresholds were 45dB in the right ear and 50dB in the left ear. Word Recognition was performed 40dB SL, scored 72% in the right ear and 68% in the left ear. Presented slowly over live voice due to patient difficulties with speech. Pure tone thresholds show mild sloping to moderately severe sensorineural hearing loss in each ear.   Results:  The test results were reviewed with Shunna and her son. Nyiesha needs hearing aids due to her significant medical history, and moderately severe high pitched sensorineural hearing loss. Son reported understanding, the family is motivated to help her  hear better.    Recommendations: Amplification is necessary for both ears. Hearing aids can be purchased from a variety of locations. Son given copy of audiogram. See provided list for locations in the [place] area. Recommend place with long appointments due to Josseline's limited motor ability and need for slower pace.    53 minutes spent testing and counseling on results.   Alfonse Alpers  Audiologist, Au.D., CCC-A 08/17/2022  3:39 PM  Cc: Pcp, No

## 2022-08-18 ENCOUNTER — Ambulatory Visit: Payer: Medicare PPO | Admitting: Audiologist

## 2022-08-19 DIAGNOSIS — N2581 Secondary hyperparathyroidism of renal origin: Secondary | ICD-10-CM | POA: Diagnosis not present

## 2022-08-19 DIAGNOSIS — N186 End stage renal disease: Secondary | ICD-10-CM | POA: Diagnosis not present

## 2022-08-19 DIAGNOSIS — Z992 Dependence on renal dialysis: Secondary | ICD-10-CM | POA: Diagnosis not present

## 2022-08-22 DIAGNOSIS — Z992 Dependence on renal dialysis: Secondary | ICD-10-CM | POA: Diagnosis not present

## 2022-08-22 DIAGNOSIS — N186 End stage renal disease: Secondary | ICD-10-CM | POA: Diagnosis not present

## 2022-08-22 DIAGNOSIS — N2581 Secondary hyperparathyroidism of renal origin: Secondary | ICD-10-CM | POA: Diagnosis not present

## 2022-08-23 ENCOUNTER — Encounter: Payer: Self-pay | Admitting: Orthopedic Surgery

## 2022-08-23 ENCOUNTER — Ambulatory Visit (INDEPENDENT_AMBULATORY_CARE_PROVIDER_SITE_OTHER): Payer: Medicare PPO | Admitting: Orthopedic Surgery

## 2022-08-23 DIAGNOSIS — N186 End stage renal disease: Secondary | ICD-10-CM | POA: Diagnosis not present

## 2022-08-23 DIAGNOSIS — Z89511 Acquired absence of right leg below knee: Secondary | ICD-10-CM

## 2022-08-23 DIAGNOSIS — E1122 Type 2 diabetes mellitus with diabetic chronic kidney disease: Secondary | ICD-10-CM | POA: Diagnosis not present

## 2022-08-23 DIAGNOSIS — S88111A Complete traumatic amputation at level between knee and ankle, right lower leg, initial encounter: Secondary | ICD-10-CM

## 2022-08-23 DIAGNOSIS — T8781 Dehiscence of amputation stump: Secondary | ICD-10-CM

## 2022-08-23 DIAGNOSIS — Z992 Dependence on renal dialysis: Secondary | ICD-10-CM | POA: Diagnosis not present

## 2022-08-23 NOTE — Progress Notes (Signed)
Office Visit Note   Patient: Carolyn Russo           Date of Birth: 1951/04/21           MRN: [id number] Visit Date: 08/23/2022              Requested by: No referring provider defined for this encounter. PCP: Pcp, No  Chief Complaint  Patient presents with   Right Leg - Follow-up    05/18/2022 right BKA Kerecis graft applied in office 08/16/2022      HPI: Patient is a 71 year old woman who is over 3 months status post right transtibial amputation.  Patient had wound dehiscence and underwent a initial Kerecis micro graft.  Patient has shown excellent incorporation of the biologic tissue graft and presents for repeat evaluation today.  Assessment & Plan: Visit Diagnoses:  1. Dehiscence of amputation stump (HCC)   2. Below-knee amputation of right lower extremity ([place])     Plan: The wound was filled with the Kerecis micro powder covered with Adaptic 4 x 4's and an Ace wrap.  Orders were written for home health nursing to leave the Adaptic in place only change the 4 x 4's and the Ace wrap and apply the stump shrinker on top of this.  Follow-Up Instructions: Return in about 2 weeks (around 09/06/2022).   Ortho Exam  Patient is alert, oriented, no adenopathy, well-dressed, normal affect, normal respiratory effort. Examination patient has improved granulation tissue in the wound bed.  The wound healing has stalled, the wound bed has healthy granulation tissue, and patient presents for evaluation and application of [place] Micro graft. After informed consent a 10 blade knife was used to debride the skin and soft tissue to healthy viable bleeding granulation tissue.  Silver nitrate was used for hemostasis. The wound measures: 3 cm in length, 3cm  in width, 20 mm in depth, wound location right BKA Kerecis MariGen micro tissue graft 19 cm2 was applied, and there was no wastage.  Please see the photo below of the Lot number and expiration date. The micro tissue graft was  covered with a nonadherent Adaptic dressing, bolstered with 4 x 4 gauze and secured with a compression wrap.     Imaging: No results found.     Labs: Lab Results  Component Value Date   HGBA1C 4.6 (L) 06/04/2022   HGBA1C 4.7 (L) 06/03/2022   HGBA1C 5.9 (H) 04/15/2022   ESRSEDRATE 56 (H) 05/10/2022   CRP 9.4 (H) 05/10/2022   REPTSTATUS 06/27/2022 FINAL 06/22/2022   CULT  06/22/2022    NO GROWTH 5 DAYS Performed at [facility], [place] [address], [place], [place] [address]      Lab Results  Component Value Date   ALBUMIN 2.8 (L) 06/29/2022   ALBUMIN 2.9 (L) 06/27/2022   ALBUMIN 2.7 (L) 06/25/2022    Lab Results  Component Value Date   MG 1.9 06/10/2022   MG 1.8 06/08/2022   MG 1.8 06/06/2022   Lab Results  Component Value Date   VD25OH 37 10/13/2011    No results found for: "PREALBUMIN"    Latest Ref Rng & Units 06/29/2022    7:20 AM 06/27/2022    3:42 AM 06/25/2022    7:51 AM  CBC EXTENDED  WBC 4.0 - 10.5 K/uL 13.4  13.7  10.9   RBC 3.87 - 5.11 MIL/uL 2.99  3.00  3.22   Hemoglobin 12.0 - 15.0 g/dL 9.7  9.7  10.2  HCT 36.0 - 46.0 % 27.9  27.8  30.2   Platelets 150 - 400 K/uL 358  364  322      There is no height or weight on file to calculate BMI.  Orders:  No orders of the defined types were placed in this encounter.  No orders of the defined types were placed in this encounter.    Procedures: No procedures performed  Clinical Data: No additional findings.  ROS:  All other systems negative, except as noted in the HPI. Review of Systems  Objective: Vital Signs: There were no vitals taken for this visit.  Specialty Comments:  No specialty comments available.  PMFS History: Patient Active Problem List   Diagnosis Date Noted   Urinary tract infection without hematuria    Diabetes mellitus due to underlying condition with hypoglycemia without coma, with long-term current use of insulin (HCC)    Acute encephalopathy 06/22/2022   Acute  respiratory failure ([place])    Unresponsiveness    Pressure ulcer 06/07/2022   Acute stroke due to ischemia ([place]) 06/03/2022   Chest pain 06/01/2022   History of CVA (cerebrovascular accident) 06/01/2022   Seizure ([place])    Encephalopathy    Below-knee amputation of right lower extremity ([place])    Leukocytosis    Foot infection 05/10/2022   PAD (peripheral artery disease) ([place]) 05/10/2022   Gangrene of right foot ([place]) 04/15/2022   Left hemiparesis ([place]) 09/24/2021   Abnormality of gait 09/24/2021   Pain of right hand 08/06/2021   Steal syndrome of dialysis vascular access ([place]) 08/06/2021   Subclavian steal syndrome of right subclavian artery 06/25/2021   Right hand weakness 06/25/2021   Stroke-like symptoms 05/06/2021   Bandemia    Cerebrovascular accident (CVA) of right basal ganglia ([place]) 04/20/2021   External hemorrhoids    Basal ganglia infarction ([place]) 04/16/2021   ESRD on dialysis ([place]) 04/16/2021   Hypertensive urgency 03/10/2021   Hilar enlargement    Anemia of chronic disease    Chronic diastolic CHF (congestive heart failure) ([place]) 03/09/2021   CKD (chronic kidney disease), stage V ([place]) 03/08/2021   Diabetic retinopathy ([place]) 03/08/2021   Hyperglycemia due to type 2 diabetes mellitus ([place]) 03/08/2021   Pure hypercholesterolemia 03/08/2021   Anemia in chronic kidney disease 09/24/2020   Uncontrolled type 2 diabetes mellitus with hyperglycemia ([place]) 03/19/2019   Osteopenia 09/04/2018   Migraine 09/04/2018   Asthma 09/04/2018   Pseudophakia of both eyes 07/17/2018   Pseudophakia of left eye 07/03/2018   Open angle with borderline findings and high glaucoma risk in left eye 07/03/2018   Age-related nuclear cataract of right eye 07/03/2018   CAD (coronary artery disease), native coronary artery 04/27/2017   Abnormal nuclear stress test 04/04/2017   Shortness of breath 03/09/2017   Appendicitis    Age-related hypermature cataract of both eyes 12/20/2016   Uterine  leiomyoma 11/27/2012   Dyslipidemia (high LDL; low HDL) 11/25/2011   Obesity (BMI 30-39.9) 11/25/2011   Hypertensive disorder 11/21/2011   Type 2 diabetes mellitus ([place]) 11/21/2011   Abnormal EKG 11/21/2011   Past Medical History:  Diagnosis Date   Acute GI bleeding    Allergy    Anemia    Anterior chest wall pain    Appendicitis 1965   Asthma    Body mass index 37.0-37.9, adult    Breast pain    Cataract    both eyes   CHF (congestive heart failure) ([place])    Chronic kidney disease  stage 5 - on dialysis   Cognitive change 04/20/2021   r/t cva 01/980   Complication of anesthesia    Memory loss after general   Dehydration 2014   Deviated septum 1971   Diabetes mellitus    Dysphagia due to old stroke    easy to get strangled when eating   Dyspnea 2014   Extrinsic asthma    WITH ASTHMA ATTACK   Fibroid 1980   GERD (gastroesophageal reflux disease)    Heart murmur    History of migraine    History of seizure    with stroke   Hx gestational diabetes    Hyperlipidemia    Hypertension 2014   Inguinal hernia 1959   Malaise and fatigue 2014   Non-IgE mediated allergic asthma 2014   Obesity    Pelvic pain    Pregnancy, high-risk 1985   Stroke ([place]) 04/20/2021   (CVA) of right basal ganglia   Tonsillitis 1968   Uterine fibroid 1980   Visual field defect    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    Family History  Problem Relation Age of Onset   Cancer Mother        abdominal melamona   Psoriasis Mother    Alzheimer's disease Father    Cancer Cousin        colon    Diabetes Cousin    Diabetes Maternal Aunt    Colon cancer Neg Hx    Colon polyps Neg Hx    Esophageal cancer Neg Hx    Rectal cancer Neg Hx    Stomach cancer Neg Hx    Breast cancer Neg Hx     Past Surgical History:  Procedure Laterality Date   ABDOMINAL AORTOGRAM W/LOWER EXTREMITY N/A 02/21/2022   Procedure: ABDOMINAL AORTOGRAM W/LOWER EXTREMITY;  Surgeon: Waynetta Sandy, MD;  Location: [facility];  Service: Cardiovascular;  Laterality: N/A;   AMPUTATION Right 05/18/2022   Procedure: RIGHT BELOW KNEE AMPUTATION;  Surgeon: Newt Minion, MD;  Location: [facility];  Service: Orthopedics;  Laterality: Right;   AMPUTATION TOE Right 04/15/2022   Procedure: AMPUTATION  LST TOERIGHT FOOT;  Surgeon: Criselda Peaches, DPM;  Location[facility];  Service: Podiatry;  Laterality: Right;   APPENDECTOMY  1914   BASCILIC VEIN TRANSPOSITION Right 04/30/2021   Procedure: RIGHT FIRST STAGE [place];  Surgeon: Angelia Mould, MD;  Location: [facility];  Service: Vascular;  Laterality: Right;   CESAREAN SECTION  1985   COLONOSCOPY     ESOPHAGOGASTRODUODENOSCOPY (EGD) WITH PROPOFOL N/A 04/18/2021   Procedure: ESOPHAGOGASTRODUODENOSCOPY (EGD) WITH PROPOFOL;  Surgeon: Gatha Mayer, MD;  Location: [facility];  Service: Endoscopy;  Laterality: N/A;   EYE SURGERY     bilateral cataract    FLEXIBLE SIGMOIDOSCOPY N/A 04/18/2021   Procedure: FLEXIBLE SIGMOIDOSCOPY;  Surgeon: Gatha Mayer, MD;  Location[facility];  Service: Endoscopy;  Laterality: N/A;   HERNIA REPAIR  1959   IR FLUORO GUIDE CV LINE RIGHT  03/18/2021   IR US GUIDE VASC ACCESS RIGHT  03/18/2021   LEFT HEART CATH AND CORONARY ANGIOGRAPHY N/A 04/04/2017   Procedure: Left Heart Cath and Coronary Angiography;  Surgeon: Belva Crome, MD;  Location: [facility];  Service: Cardiovascular;  Laterality: N/A;   [place], 2004, 2007   PERIPHERAL VASCULAR THROMBECTOMY  02/21/2022   Procedure: PERIPHERAL VASCULAR THROMBECTOMY;  Surgeon: Waynetta Sandy, MD;  Location: [facility];  Service: Cardiovascular;;  Right Tibial   RHINOPLASTY  1971   ROTATOR CUFF REPAIR  2003   SURGICAL REPAIR OF HEMORRHAGE  2015   TONSILLECTOMY  1968   Social History   Occupational History   Occupation: retired  Tobacco Use   Smoking status: Never   Smokeless tobacco: Never  Vaping Use   Vaping Use: Never  used  Substance and Sexual Activity   Alcohol use: No   Drug use: No   Sexual activity: Not Currently    Birth control/protection: Post-menopausal

## 2022-08-24 ENCOUNTER — Ambulatory Visit: Payer: Medicare PPO | Admitting: Vascular Surgery

## 2022-08-24 ENCOUNTER — Encounter: Payer: Self-pay | Admitting: Vascular Surgery

## 2022-08-24 VITALS — BP 178/83 | HR 58 | Temp 97.9°F | Resp 20 | Ht 65.0 in | Wt 180.0 lb

## 2022-08-24 DIAGNOSIS — I70222 Atherosclerosis of native arteries of extremities with rest pain, left leg: Secondary | ICD-10-CM | POA: Diagnosis not present

## 2022-08-24 DIAGNOSIS — N186 End stage renal disease: Secondary | ICD-10-CM

## 2022-08-24 DIAGNOSIS — N2581 Secondary hyperparathyroidism of renal origin: Secondary | ICD-10-CM | POA: Diagnosis not present

## 2022-08-24 DIAGNOSIS — Z992 Dependence on renal dialysis: Secondary | ICD-10-CM | POA: Diagnosis not present

## 2022-08-24 NOTE — Progress Notes (Signed)
Patient ID: Carolyn Russo, female   DOB: 02/06/1951, 71 y.o.   MRN: [id number]  Reason for Consult: Follow-up   Referred by [name], MD  Subjective:     HPI:  Carolyn Russo is a 71 y.o. female history of right below-knee amputation followed by Dr. Sharol Given.  She more ulceration had ulceration on the left side occluding the left lateral heel and left third toe which was present for several weeks.  The heel wound thankfully healed but she still has dark discoloration of the left third toe.  She continues to have a fistula in the right arm that has not been superficialized and is dialyzing via catheter but is interested in home dialysis.  She had ABIs performed prior to last visit.  She continues on dialysis via catheter Mondays Wednesdays and Fridays.  Past Medical History:  Diagnosis Date   Acute GI bleeding    Allergy    Anemia    Anterior chest wall pain    Appendicitis 1965   Asthma    Body mass index 37.0-37.9, adult    Breast pain    Cataract    both eyes   CHF (congestive heart failure) ([place])    Chronic kidney disease    stage 5 - on dialysis   Cognitive change 04/20/2021   r/t cva 05/7866   Complication of anesthesia    Memory loss after general   Dehydration 2014   Deviated septum 1971   Diabetes mellitus    Dysphagia due to old stroke    easy to get strangled when eating   Dyspnea 2014   Extrinsic asthma    WITH ASTHMA ATTACK   Fibroid 1980   GERD (gastroesophageal reflux disease)    Heart murmur    History of migraine    History of seizure    with stroke   Hx gestational diabetes    Hyperlipidemia    Hypertension 2014   Inguinal hernia 1959   Malaise and fatigue 2014   Non-IgE mediated allergic asthma 2014   Obesity    Pelvic pain    Pregnancy, high-risk 1985   Stroke ([place]) 04/20/2021   (CVA) of right basal ganglia   Tonsillitis 1968   Uterine fibroid 1980   Visual field defect    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   Family History  Problem Relation Age  of Onset   Cancer Mother        abdominal melamona   Psoriasis Mother    Alzheimer's disease Father    Cancer Cousin        colon    Diabetes Cousin    Diabetes Maternal Aunt    Colon cancer Neg Hx    Colon polyps Neg Hx    Esophageal cancer Neg Hx    Rectal cancer Neg Hx    Stomach cancer Neg Hx    Breast cancer Neg Hx    Past Surgical History:  Procedure Laterality Date   ABDOMINAL AORTOGRAM W/LOWER EXTREMITY N/A 02/21/2022   Procedure: ABDOMINAL AORTOGRAM W/LOWER EXTREMITY;  Surgeon: Waynetta Sandy, MD;  Location: [facility];  Service: Cardiovascular;  Laterality: N/A;   AMPUTATION Right 05/18/2022   Procedure: RIGHT BELOW KNEE AMPUTATION;  Surgeon: Newt Minion, MD;  Location: [facility];  Service: Orthopedics;  Laterality: Right;   AMPUTATION TOE Right 04/15/2022   Procedure: AMPUTATION  LST TOERIGHT FOOT;  Surgeon: Criselda Peaches, DPM;  Location: [facility];  Service: Podiatry;  Laterality: Right;  APPENDECTOMY  8675   BASCILIC VEIN TRANSPOSITION Right 04/30/2021   Procedure: RIGHT FIRST STAGE BASCILIC VEIN TRANSPOSITION;  Surgeon: Angelia Mould, MD;  Location: [facility];  Service: Vascular;  Laterality: Right;   CESAREAN SECTION  1985   COLONOSCOPY     ESOPHAGOGASTRODUODENOSCOPY (EGD) WITH PROPOFOL N/A 04/18/2021   Procedure: ESOPHAGOGASTRODUODENOSCOPY (EGD) WITH PROPOFOL;  Surgeon: Gatha Mayer, MD;  Location: [facility];  Service: Endoscopy;  Laterality: N/A;   EYE SURGERY     bilateral cataract    FLEXIBLE SIGMOIDOSCOPY N/A 04/18/2021   Procedure: FLEXIBLE SIGMOIDOSCOPY;  Surgeon: Gatha Mayer, MD;  [facility];  Service: Endoscopy;  Laterality: N/A;   HERNIA REPAIR  1959   IR FLUORO GUIDE CV LINE RIGHT  03/18/2021   IR US GUIDE VASC ACCESS RIGHT  03/18/2021   LEFT HEART CATH AND CORONARY ANGIOGRAPHY N/A 04/04/2017   Procedure: Left Heart Cath and Coronary Angiography;  Surgeon: Belva Crome, MD;  Location: [facility];  Service:  Cardiovascular;  Laterality: N/A;   [place], 2004, 2007   PERIPHERAL VASCULAR THROMBECTOMY  02/21/2022   Procedure: PERIPHERAL VASCULAR THROMBECTOMY;  Surgeon: Waynetta Sandy, MD;  Location: [facility];  Service: Cardiovascular;;  Right Tibial   RHINOPLASTY  1971   ROTATOR CUFF REPAIR  2003   SURGICAL REPAIR OF HEMORRHAGE  2015   TONSILLECTOMY  1968    Short Social History:  Social History   Tobacco Use   Smoking status: Never   Smokeless tobacco: Never  Substance Use Topics   Alcohol use: No    Allergies  Allergen Reactions   Food Anaphylaxis    Peanuts - anaphylaxis Almonds - anaphylaxis Not listed on MAR   Wheat Bran Other (See Comments)    Upset stomach  Not listed on MAR   Statins Itching and Other (See Comments)    Generalized aches- tolerates crestor  Not listed on MAR   Gadolinium Derivatives Other (See Comments)    Gadolinium-Containing Contrast Media Listed as an allergy on MAR Unknown reaction   Januvia [Sitagliptin] Other (See Comments)    Listed as an allergy on MAR Unknown reaction   Pork-Derived Products Other (See Comments)    Does not eat pork  Listed on MAR   Shellfish Allergy Other (See Comments)    Mouth gets raw Listed on MAR   Tetracycline Other (See Comments)    Raw mouth Not listed on MAR    Current Outpatient Medications  Medication Sig Dispense Refill   acetaminophen (TYLENOL) 500 MG tablet Take 1,000 mg by mouth 2 (two) times daily as needed (leg and arm pain).     Amino Acids-Protein Hydrolys (PRO-STAT) LIQD Take 30 mLs by mouth 2 (two) times daily.     aspirin EC 81 MG tablet Take 1 tablet (81 mg total) by mouth daily. Swallow whole. 30 tablet 12   carvedilol (COREG) 3.125 MG tablet Take 1 tablet (3.125 mg total) by mouth 2 (two) times daily with a meal. 180 tablet 1   cephALEXin (KEFLEX) 500 MG capsule Take 500 mg by mouth in the morning and at bedtime.     collagenase (SANTYL) 250 UNIT/GM ointment Apply  1 Application topically daily. 30 g 0   collagenase (SANTYL) 250 UNIT/GM ointment Apply 1 Application topically daily. Apply to the affected area daily plus dry dressing 90 g 3   diclofenac Sodium (VOLTAREN) 1 % GEL Apply 2 g topically 4 (four) times daily. To left  wrist 150 g 0   Glucagon, rDNA, (GLUCAGON EMERGENCY) 1 MG KIT Inject 1 mg into the vein as needed (CBG of 45m/dL).     insulin aspart (NOVOLOG) 100 UNIT/ML injection Inject 0-6 Units into the skin 3 (three) times daily with meals. CBG < 70: Implement Hypoglycemia Standing Orders and refer to Hypoglycemia Standing Orders sidebar report CBG 70 - 120: 0 units CBG 121 - 150: 0 units CBG 151 - 200: 1 unit CBG 201-250: 2 units CBG 251-300: 3 units CBG 301-350: 4 units CBG 351-400: 5 units CBG > 400: Give 6 units and call MD     insulin glargine (LANTUS) 100 UNIT/ML Solostar Pen Inject 5 Units into the skin at bedtime. 15 mL    levETIRAcetam (KEPPRA) 250 MG tablet Take 1.5 tablets (375 mg total) by mouth every Monday, Wednesday, and Friday at 6 PM.     levETIRAcetam (KEPPRA) 750 MG tablet Take 1 tablet (750 mg total) by mouth at bedtime.     methylphenidate (RITALIN) 5 MG tablet Take 1 tablet (5 mg total) by mouth 2 (two) times daily with breakfast and lunch. Due to stroke attention (Patient taking differently: Take 5 mg by mouth 2 (two) times daily with breakfast and lunch.) 180 tablet 0   midodrine (PROAMATINE) 10 MG tablet Take 1 tablet (10 mg total) by mouth every Monday, Wednesday, and Friday with hemodialysis.     mometasone-formoterol (DULERA) 200-5 MCG/ACT AERO Inhale 2 puffs into the lungs 2 (two) times daily. 3 each 2   ondansetron (ZOFRAN-ODT) 4 MG disintegrating tablet Take 1 tablet (4 mg total) by mouth every 8 (eight) hours as needed for nausea or vomiting. 20 tablet 1   pantoprazole (PROTONIX) 40 MG tablet Take 1 tablet (40 mg total) by mouth daily. (Patient taking differently: Take 40 mg by mouth at bedtime.) 90 tablet 2    polyethylene glycol (MIRALAX / GLYCOLAX) 17 g packet Take 17 g by mouth daily as needed for moderate constipation.  0   rosuvastatin (CRESTOR) 20 MG tablet Take 1 tablet (20 mg total) by mouth daily. (Patient taking differently: Take 20 mg by mouth at bedtime.) 90 tablet 2   senna-docusate (SENOKOT-S) 8.6-50 MG tablet Take 2 tablets by mouth 2 (two) times daily. 360 tablet 2   sevelamer carbonate (RENVELA) 0.8 g PACK packet Take 0.8 g by mouth 3 (three) times daily.     thiamine (VITAMIN B-1) 100 MG tablet Take 1 tablet (100 mg total) by mouth daily. 30 tablet 1   ticagrelor (BRILINTA) 90 MG TABS tablet Take 1 tablet (90 mg total) by mouth 2 (two) times daily. 60 tablet 3   No current facility-administered medications for this visit.    Review of Systems  Constitutional:  Constitutional negative. HENT: HENT negative.  Eyes: Eyes negative.  Respiratory: Respiratory negative.  Cardiovascular: Cardiovascular negative.  GI: Gastrointestinal negative.  Skin: Positive for wound.  Neurological: Neurological negative. Hematologic: Hematologic/lymphatic negative.  Psychiatric: Psychiatric negative.        Objective:  Objective   Vitals:   08/24/22 1514  BP: (!) 178/83  Pulse: (!) 58  Resp: 20  Temp: 97.9 F (36.6 C)  SpO2: 97%  Weight: 180 lb (81.6 kg)  Height: 5' 5" (1.651 m)   Body mass index is 29.95 kg/m.  Physical Exam HENT:     Head: Normocephalic.     Mouth/Throat:     Mouth: Mucous membranes are moist.  Eyes:     Pupils: Pupils are equal, round,  and reactive to light.  Pulmonary:     Effort: Pulmonary effort is normal.  Abdominal:     General: Abdomen is flat.     Palpations: Abdomen is soft.  Musculoskeletal:     Comments: Dressing on right below-knee amputation Left middle toe with dark discoloration Strong thrill right upper arm AV fistula  Neurological:     Mental Status: She is alert.     Data: ABI Findings:   +---------+------------------+-----+--------+--------+  Right    Rt Pressure (mmHg)IndexWaveformComment   +---------+------------------+-----+--------+--------+  Brachial                                AVF       +---------+------------------+-----+--------+--------+  PTA                                     BKA       +---------+------------------+-----+--------+--------+  DP                                      BKA       +---------+------------------+-----+--------+--------+  Great Toe                               BKA       +---------+------------------+-----+--------+--------+   +---------+------------------+-----+----------+-------+  Left     Lt Pressure (mmHg)IndexWaveform  Comment  +---------+------------------+-----+----------+-------+  Brachial 128                                       +---------+------------------+-----+----------+-------+  PTA      115               0.90 monophasic         +---------+------------------+-----+----------+-------+  DP       144               1.12 monophasic         +---------+------------------+-----+----------+-------+  Great Toe0                 0.00 Absent             +---------+------------------+-----+----------+-------+   +-------+-----------+-----------+------------+------------+  ABI/TBIToday's ABIToday's TBIPrevious ABIPrevious TBI  +-------+-----------+-----------+------------+------------+  Right  BKA                   CNO         0             +-------+-----------+-----------+------------+------------+  Left   1.13       0          CNO         0.36          +-------+-----------+-----------+------------+------------+       Arterial wall calcification precludes accurate ankle pressures and ABIs.   Bilateral ABIs and TBIs appear decreased compared to prior study on  03/29/22.     Summary:  Left: Resting left ankle-brachial index is within normal range.  The left  toe-brachial index is abnormal.  Although ankle brachial indices are within normal limits (0.95-1.29),  arterial Doppler waveforms at the ankle suggest some component of arterial  occlusive disease.  Assessment/Plan:    71 year old diabetic female with end-stage renal disease currently dialyzing via catheter.  She does have a fistula in the right arm which is suitable for second stage conversion which she continues to defer any other surgeries until her below-knee amputation heals.  She does have dark discoloration of the left third toe with toe pressure of 0 and we have discussed angiogram with possible treatment the left lower extremity.  Again she would like to wait until the right BKA heals.  I discussed diligent care of the left foot with the patient, her husband and caregiver.  We will have her follow-up in 3 months with repeat ABIs unless she has a new wound on the left foot which would necessitate angiography from a right common femoral approach.  They demonstrate good understanding.     Waynetta Sandy MD [facility]

## 2022-08-26 DIAGNOSIS — N2581 Secondary hyperparathyroidism of renal origin: Secondary | ICD-10-CM | POA: Diagnosis not present

## 2022-08-26 DIAGNOSIS — Z992 Dependence on renal dialysis: Secondary | ICD-10-CM | POA: Diagnosis not present

## 2022-08-26 DIAGNOSIS — N186 End stage renal disease: Secondary | ICD-10-CM | POA: Diagnosis not present

## 2022-08-29 DIAGNOSIS — N2581 Secondary hyperparathyroidism of renal origin: Secondary | ICD-10-CM | POA: Diagnosis not present

## 2022-08-29 DIAGNOSIS — Z992 Dependence on renal dialysis: Secondary | ICD-10-CM | POA: Diagnosis not present

## 2022-08-29 DIAGNOSIS — N186 End stage renal disease: Secondary | ICD-10-CM | POA: Diagnosis not present

## 2022-08-30 ENCOUNTER — Other Ambulatory Visit: Payer: Self-pay

## 2022-08-30 DIAGNOSIS — I739 Peripheral vascular disease, unspecified: Secondary | ICD-10-CM

## 2022-08-31 DIAGNOSIS — N186 End stage renal disease: Secondary | ICD-10-CM | POA: Diagnosis not present

## 2022-08-31 DIAGNOSIS — N2581 Secondary hyperparathyroidism of renal origin: Secondary | ICD-10-CM | POA: Diagnosis not present

## 2022-08-31 DIAGNOSIS — Z992 Dependence on renal dialysis: Secondary | ICD-10-CM | POA: Diagnosis not present

## 2022-09-02 DIAGNOSIS — N2581 Secondary hyperparathyroidism of renal origin: Secondary | ICD-10-CM | POA: Diagnosis not present

## 2022-09-02 DIAGNOSIS — Z992 Dependence on renal dialysis: Secondary | ICD-10-CM | POA: Diagnosis not present

## 2022-09-02 DIAGNOSIS — N186 End stage renal disease: Secondary | ICD-10-CM | POA: Diagnosis not present

## 2022-09-02 DIAGNOSIS — E1165 Type 2 diabetes mellitus with hyperglycemia: Secondary | ICD-10-CM | POA: Diagnosis not present

## 2022-09-05 DIAGNOSIS — Z992 Dependence on renal dialysis: Secondary | ICD-10-CM | POA: Diagnosis not present

## 2022-09-05 DIAGNOSIS — N186 End stage renal disease: Secondary | ICD-10-CM | POA: Diagnosis not present

## 2022-09-05 DIAGNOSIS — N2581 Secondary hyperparathyroidism of renal origin: Secondary | ICD-10-CM | POA: Diagnosis not present

## 2022-09-06 ENCOUNTER — Ambulatory Visit (INDEPENDENT_AMBULATORY_CARE_PROVIDER_SITE_OTHER): Payer: Medicare PPO | Admitting: Orthopedic Surgery

## 2022-09-06 ENCOUNTER — Encounter: Payer: Self-pay | Admitting: Orthopedic Surgery

## 2022-09-06 DIAGNOSIS — T8781 Dehiscence of amputation stump: Secondary | ICD-10-CM

## 2022-09-06 DIAGNOSIS — S88111A Complete traumatic amputation at level between knee and ankle, right lower leg, initial encounter: Secondary | ICD-10-CM

## 2022-09-06 DIAGNOSIS — Z89511 Acquired absence of right leg below knee: Secondary | ICD-10-CM

## 2022-09-06 MED ORDER — SILVER SULFADIAZINE 1 % EX CREA: 1.0000 | TOPICAL_CREAM | Freq: Every day | CUTANEOUS | 3 refills | Status: DC

## 2022-09-06 NOTE — Progress Notes (Signed)
Office Visit Note   Patient: Carolyn Russo           Date of Birth: Sep 17, 1951           MRN: [id number] Visit Date: 09/06/2022              Requested by: No referring provider defined for this encounter. PCP: Pcp, No  Chief Complaint  Patient presents with   Right Leg - Routine Post Op    05/18/2022 right BKA Kerecis graft applied in office 08/16/2022 and 08/23/22       HPI: Patient is a 71 year old woman status post application of Kerecis tissue graft in the office x2.  Assessment & Plan: Visit Diagnoses:  1. Dehiscence of amputation stump (HCC)   2. Below-knee amputation of right lower extremity ([place])     Plan: Wound is filled in nicely excellent granulation tissue.  Will start routine wound care for a week and plan to apply tissue graft in 1 week as the final amount of the tissue graft incorporates.  Follow-Up Instructions: Return in about 1 week (around 09/13/2022).   Ortho Exam  Patient is alert, oriented, no adenopathy, well-dressed, normal affect, normal respiratory effort. Examination the Kerecis tissue graft is almost completely incorporated.  There is 75% healthy granulation tissue the wound is 2 x 3 cm and 5 mm deep with significant improvement in filling in the depth of the wound.  Imaging: No results found.    Labs: Lab Results  Component Value Date   HGBA1C 4.6 (L) 06/04/2022   HGBA1C 4.7 (L) 06/03/2022   HGBA1C 5.9 (H) 04/15/2022   ESRSEDRATE 56 (H) 05/10/2022   CRP 9.4 (H) 05/10/2022   REPTSTATUS 06/27/2022 FINAL 06/22/2022   CULT  06/22/2022    NO GROWTH 5 DAYS Performed at [facility], [place] [address] [place], [place] [address]      Lab Results  Component Value Date   ALBUMIN 2.8 (L) 06/29/2022   ALBUMIN 2.9 (L) 06/27/2022   ALBUMIN 2.7 (L) 06/25/2022    Lab Results  Component Value Date   MG 1.9 06/10/2022   MG 1.8 06/08/2022   MG 1.8 06/06/2022   Lab Results  Component Value Date   VD25OH 37 10/13/2011    No  results found for: "PREALBUMIN"    Latest Ref Rng & Units 06/29/2022    7:20 AM 06/27/2022    3:42 AM 06/25/2022    7:51 AM  CBC EXTENDED  WBC 4.0 - 10.5 K/uL 13.4  13.7  10.9   RBC 3.87 - 5.11 MIL/uL 2.99  3.00  3.22   Hemoglobin 12.0 - 15.0 g/dL 9.7  9.7  10.2   HCT 36.0 - 46.0 % 27.9  27.8  30.2   Platelets 150 - 400 K/uL 358  364  322      There is no height or weight on file to calculate BMI.  Orders:  No orders of the defined types were placed in this encounter.  No orders of the defined types were placed in this encounter.    Procedures: No procedures performed  Clinical Data: No additional findings.  ROS:  All other systems negative, except as noted in the HPI. Review of Systems  Objective: Vital Signs: There were no vitals taken for this visit.  Specialty Comments:  No specialty comments available.  PMFS History: Patient Active Problem List   Diagnosis Date Noted   Urinary tract infection without hematuria    Diabetes mellitus due to  underlying condition with hypoglycemia without coma, with long-term current use of insulin (HCC)    Acute encephalopathy 06/22/2022   Acute respiratory failure ([place])    Unresponsiveness    Pressure ulcer 06/07/2022   Acute stroke due to ischemia ([place]) 06/03/2022   Chest pain 06/01/2022   History of CVA (cerebrovascular accident) 06/01/2022   Seizure ([place])    Encephalopathy    Below-knee amputation of right lower extremity ([place])    Leukocytosis    Foot infection 05/10/2022   PAD (peripheral artery disease) ([place]) 05/10/2022   Gangrene of right foot ([place]) 04/15/2022   Left hemiparesis ([place]) 09/24/2021   Abnormality of gait 09/24/2021   Pain of right hand 08/06/2021   Steal syndrome of dialysis vascular access ([place]) 08/06/2021   Subclavian steal syndrome of right subclavian artery 06/25/2021   Right hand weakness 06/25/2021   Stroke-like symptoms 05/06/2021   Bandemia    Cerebrovascular accident (CVA) of right basal  ganglia ([place]) 04/20/2021   External hemorrhoids    Basal ganglia infarction ([place]) 04/16/2021   ESRD on dialysis ([place]) 04/16/2021   Hypertensive urgency 03/10/2021   Hilar enlargement    Anemia of chronic disease    Chronic diastolic CHF (congestive heart failure) ([place]) 03/09/2021   CKD (chronic kidney disease), stage V ([place]) 03/08/2021   Diabetic retinopathy ([place]) 03/08/2021   Hyperglycemia due to type 2 diabetes mellitus ([place]) 03/08/2021   Pure hypercholesterolemia 03/08/2021   Anemia in chronic kidney disease 09/24/2020   Uncontrolled type 2 diabetes mellitus with hyperglycemia ([place]) 03/19/2019   Osteopenia 09/04/2018   Migraine 09/04/2018   Asthma 09/04/2018   Pseudophakia of both eyes 07/17/2018   Pseudophakia of left eye 07/03/2018   Open angle with borderline findings and high glaucoma risk in left eye 07/03/2018   Age-related nuclear cataract of right eye 07/03/2018   CAD (coronary artery disease), native coronary artery 04/27/2017   Abnormal nuclear stress test 04/04/2017   Shortness of breath 03/09/2017   Appendicitis    Age-related hypermature cataract of both eyes 12/20/2016   Uterine leiomyoma 11/27/2012   Dyslipidemia (high LDL; low HDL) 11/25/2011   Obesity (BMI 30-39.9) 11/25/2011   Hypertensive disorder 11/21/2011   Type 2 diabetes mellitus ([place]) 11/21/2011   Abnormal EKG 11/21/2011   Past Medical History:  Diagnosis Date   Acute GI bleeding    Allergy    Anemia    Anterior chest wall pain    Appendicitis 1965   Asthma    Body mass index 37.0-37.9, adult    Breast pain    Cataract    both eyes   CHF (congestive heart failure) ([place])    Chronic kidney disease    stage 5 - on dialysis   Cognitive change 04/20/2021   r/t cva 03/2129   Complication of anesthesia    Memory loss after general   Dehydration 2014   Deviated septum 1971   Diabetes mellitus    Dysphagia due to old stroke    easy to get strangled when eating   Dyspnea 2014   Extrinsic  asthma    WITH ASTHMA ATTACK   Fibroid 1980   GERD (gastroesophageal reflux disease)    Heart murmur    History of migraine    History of seizure    with stroke   Hx gestational diabetes    Hyperlipidemia    Hypertension 2014   Inguinal hernia 1959   Malaise and fatigue 2014   Non-IgE mediated allergic asthma 2014   Obesity  Pelvic pain    Pregnancy, high-risk 1985   Stroke ([place]) 04/20/2021   (CVA) of right basal ganglia   Tonsillitis 1968   Uterine fibroid 1980   Visual field defect    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    Family History  Problem Relation Age of Onset   Cancer Mother        abdominal melamona   Psoriasis Mother    Alzheimer's disease Father    Cancer Cousin        colon    Diabetes Cousin    Diabetes Maternal Aunt    Colon cancer Neg Hx    Colon polyps Neg Hx    Esophageal cancer Neg Hx    Rectal cancer Neg Hx    Stomach cancer Neg Hx    Breast cancer Neg Hx     Past Surgical History:  Procedure Laterality Date   ABDOMINAL AORTOGRAM W/LOWER EXTREMITY N/A 02/21/2022   Procedure: ABDOMINAL AORTOGRAM W/LOWER EXTREMITY;  Surgeon: Waynetta Sandy, MD;  Location: [facility];  Service: Cardiovascular;  Laterality: N/A;   AMPUTATION Right 05/18/2022   Procedure: RIGHT BELOW KNEE AMPUTATION;  Surgeon: Newt Minion, MD;  Location: [facility];  Service: Orthopedics;  Laterality: Right;   AMPUTATION TOE Right 04/15/2022   Procedure: AMPUTATION  LST TOERIGHT FOOT;  Surgeon: Criselda Peaches, DPM;  Location[facility];  Service: Podiatry;  Laterality: Right;   APPENDECTOMY  0175   BASCILIC VEIN TRANSPOSITION Right 04/30/2021   Procedure: RIGHT FIRST STAGE [place];  Surgeon: Angelia Mould, MD;  Location: [employer];  Service: Vascular;  Laterality: Right;   CESAREAN SECTION  1985   COLONOSCOPY     ESOPHAGOGASTRODUODENOSCOPY (EGD) WITH PROPOFOL N/A 04/18/2021   Procedure: ESOPHAGOGASTRODUODENOSCOPY (EGD) WITH PROPOFOL;  Surgeon: Gatha Mayer, MD;  Location: [facility];  Service: Endoscopy;  Laterality: N/A;   EYE SURGERY     bilateral cataract    FLEXIBLE SIGMOIDOSCOPY N/A 04/18/2021   Procedure: FLEXIBLE SIGMOIDOSCOPY;  Surgeon: Gatha Mayer, MD;  Location[facility];  Service: Endoscopy;  Laterality: N/A;   HERNIA REPAIR  1959   IR FLUORO GUIDE CV LINE RIGHT  03/18/2021   IR US GUIDE VASC ACCESS RIGHT  03/18/2021   LEFT HEART CATH AND CORONARY ANGIOGRAPHY N/A 04/04/2017   Procedure: Left Heart Cath and Coronary Angiography;  Surgeon: Belva Crome, MD;  Location: [facility];  Service: Cardiovascular;  Laterality: N/A;   [place], 2004, 2007   PERIPHERAL VASCULAR THROMBECTOMY  02/21/2022   Procedure: PERIPHERAL VASCULAR THROMBECTOMY;  Surgeon: Waynetta Sandy, MD;  Location: [facility];  Service: Cardiovascular;;  Right Tibial   RHINOPLASTY  1971   ROTATOR CUFF REPAIR  2003   SURGICAL REPAIR OF HEMORRHAGE  2015   TONSILLECTOMY  1968   Social History   Occupational History   Occupation: retired  Tobacco Use   Smoking status: Never   Smokeless tobacco: Never  Vaping Use   Vaping Use: Never used  Substance and Sexual Activity   Alcohol use: No   Drug use: No   Sexual activity: Not Currently    Birth control/protection: Post-menopausal

## 2022-09-07 ENCOUNTER — Telehealth: Payer: Self-pay

## 2022-09-07 DIAGNOSIS — N186 End stage renal disease: Secondary | ICD-10-CM | POA: Diagnosis not present

## 2022-09-07 DIAGNOSIS — N2581 Secondary hyperparathyroidism of renal origin: Secondary | ICD-10-CM | POA: Diagnosis not present

## 2022-09-07 DIAGNOSIS — Z992 Dependence on renal dialysis: Secondary | ICD-10-CM | POA: Diagnosis not present

## 2022-09-07 NOTE — Telephone Encounter (Signed)
SW Hinton Dyer, advised her to use silvadene dressings and wrap with ACE and put stump shrinker over top daily. Daily cleansings as well for the family to do when she is not there.

## 2022-09-07 NOTE — Telephone Encounter (Signed)
Hinton Dyer, nurse with [facility] would like wound care instructions for patient.  States that she is there with patient now.  Cb#[phone].  Please advise.  Thank you.

## 2022-09-08 ENCOUNTER — Ambulatory Visit: Payer: Medicare Other

## 2022-09-09 DIAGNOSIS — Z992 Dependence on renal dialysis: Secondary | ICD-10-CM | POA: Diagnosis not present

## 2022-09-09 DIAGNOSIS — N186 End stage renal disease: Secondary | ICD-10-CM | POA: Diagnosis not present

## 2022-09-09 DIAGNOSIS — N2581 Secondary hyperparathyroidism of renal origin: Secondary | ICD-10-CM | POA: Diagnosis not present

## 2022-09-11 DIAGNOSIS — N2581 Secondary hyperparathyroidism of renal origin: Secondary | ICD-10-CM | POA: Diagnosis not present

## 2022-09-11 DIAGNOSIS — Z992 Dependence on renal dialysis: Secondary | ICD-10-CM | POA: Diagnosis not present

## 2022-09-11 DIAGNOSIS — N186 End stage renal disease: Secondary | ICD-10-CM | POA: Diagnosis not present

## 2022-09-12 ENCOUNTER — Encounter: Payer: Self-pay | Admitting: Internal Medicine

## 2022-09-12 ENCOUNTER — Other Ambulatory Visit (INDEPENDENT_AMBULATORY_CARE_PROVIDER_SITE_OTHER): Payer: Medicare PPO | Admitting: Internal Medicine

## 2022-09-12 DIAGNOSIS — I132 Hypertensive heart and chronic kidney disease with heart failure and with stage 5 chronic kidney disease, or end stage renal disease: Secondary | ICD-10-CM | POA: Diagnosis not present

## 2022-09-12 DIAGNOSIS — G9340 Encephalopathy, unspecified: Secondary | ICD-10-CM | POA: Diagnosis not present

## 2022-09-12 DIAGNOSIS — Z794 Long term (current) use of insulin: Secondary | ICD-10-CM | POA: Diagnosis not present

## 2022-09-12 DIAGNOSIS — I5032 Chronic diastolic (congestive) heart failure: Secondary | ICD-10-CM | POA: Diagnosis not present

## 2022-09-12 DIAGNOSIS — Z7982 Long term (current) use of aspirin: Secondary | ICD-10-CM

## 2022-09-12 DIAGNOSIS — N186 End stage renal disease: Secondary | ICD-10-CM | POA: Diagnosis not present

## 2022-09-12 DIAGNOSIS — Z89411 Acquired absence of right great toe: Secondary | ICD-10-CM | POA: Diagnosis not present

## 2022-09-12 DIAGNOSIS — E1122 Type 2 diabetes mellitus with diabetic chronic kidney disease: Secondary | ICD-10-CM

## 2022-09-12 DIAGNOSIS — Z992 Dependence on renal dialysis: Secondary | ICD-10-CM

## 2022-09-12 DIAGNOSIS — E669 Obesity, unspecified: Secondary | ICD-10-CM

## 2022-09-12 DIAGNOSIS — Z9181 History of falling: Secondary | ICD-10-CM

## 2022-09-12 NOTE — Progress Notes (Signed)
Cc: home health certification  Received home health orders orders from [facility]. Start of care 08/13/22.   Certification and orders from 08/13/22 through 10/11/22 are reviewed, signed and faxed back to home health company.  Need of intermittent skilled services at home: SN, PT, OT  The home health care plan has been established by me and will be reviewed and updated as needed to maximize patient recovery.  I certify that all home health services have been and will be furnished to the patient while under my care.  Face-to-face encounter in which the need for home health services was established: 06/22/22  Patient is receiving home health services for the following diagnoses: Problem List Items Addressed This Visit       Cardiovascular and Mediastinum   Chronic diastolic CHF (congestive heart failure) (HCC)     Nervous and Auditory   Encephalopathy     Genitourinary   ESRD on dialysis ([place])     Other   Obesity (BMI 30-39.9)   Other Visit Diagnoses     Type 2 diabetes mellitus with ESRD (end-stage renal disease) ([place])    -  Primary   Malignant hypertension with heart failure and end-stage renal dis ([place])       Acquired absence of right great toe ([place])       History of fall       Long term (current) use of insulin ([place])       Long-term use of aspirin therapy            Maximino [place], MD

## 2022-09-13 ENCOUNTER — Ambulatory Visit (INDEPENDENT_AMBULATORY_CARE_PROVIDER_SITE_OTHER): Payer: Medicare PPO | Admitting: Orthopedic Surgery

## 2022-09-13 DIAGNOSIS — N186 End stage renal disease: Secondary | ICD-10-CM | POA: Diagnosis not present

## 2022-09-13 DIAGNOSIS — Z992 Dependence on renal dialysis: Secondary | ICD-10-CM | POA: Diagnosis not present

## 2022-09-13 DIAGNOSIS — S88111A Complete traumatic amputation at level between knee and ankle, right lower leg, initial encounter: Secondary | ICD-10-CM | POA: Diagnosis not present

## 2022-09-13 DIAGNOSIS — T8781 Dehiscence of amputation stump: Secondary | ICD-10-CM | POA: Diagnosis not present

## 2022-09-13 DIAGNOSIS — N2581 Secondary hyperparathyroidism of renal origin: Secondary | ICD-10-CM | POA: Diagnosis not present

## 2022-09-16 ENCOUNTER — Encounter: Payer: Self-pay | Admitting: Orthopedic Surgery

## 2022-09-16 DIAGNOSIS — N186 End stage renal disease: Secondary | ICD-10-CM | POA: Diagnosis not present

## 2022-09-16 DIAGNOSIS — N2581 Secondary hyperparathyroidism of renal origin: Secondary | ICD-10-CM | POA: Diagnosis not present

## 2022-09-16 DIAGNOSIS — Z992 Dependence on renal dialysis: Secondary | ICD-10-CM | POA: Diagnosis not present

## 2022-09-16 NOTE — Progress Notes (Signed)
Office Visit Note   Patient: Carolyn Russo           Date of Birth: 08/07/1951           MRN: [id number] Visit Date: 09/13/2022              Requested by: No referring provider defined for this encounter. PCP: Pcp, No  Chief Complaint  Patient presents with   Right Leg - Follow-up     05/18/2022 right BKA Kerecis graft applied in office 08/16/2022 and 08/23/22          HPI: Patient is a 71 year old woman who presents in follow-up for dehiscence right transtibial amputation status post application of Kerecis tissue graft on October 24 and October 31.  Assessment & Plan: Visit Diagnoses:  1. Dehiscence of amputation stump (HCC)   2. Below-knee amputation of right lower extremity ([place])     Plan: Kerecis tissue graft reapplied patient is showing excellent progression.  Follow-Up Instructions: Return in about 1 week (around 09/20/2022).   Ortho Exam  Patient is alert, oriented, no adenopathy, well-dressed, normal affect, normal respiratory effort. Examination the wound bed has decreased depth and improved granulation tissue patient is showing excellent improvement in wound healing with the Kerecis graft.  8 cm of Kerecis was reapplied.  The wound healing has stalled, the wound bed has healthy granulation tissue, and patient presents for evaluation and application of [place] Micro graft. After informed consent a 10 blade knife was used to debride the skin and soft tissue to healthy viable bleeding granulation tissue.  Silver nitrate was used for hemostasis. The wound measures: 1.2 cm in length, 2cm  in width, 2 mm in depth, wound location right BKA Kerecis [employer] micro tissue graft 8 cm2 was applied, and there was no wastage.  Please see the photo below of the Lot number and expiration date. The micro tissue graft was covered with a nonadherent Adaptic dressing, bolstered with 4 x 4 gauze and secured with a compression wrap.     Imaging: No results  found.      Labs: Lab Results  Component Value Date   HGBA1C 4.6 (L) 06/04/2022   HGBA1C 4.7 (L) 06/03/2022   HGBA1C 5.9 (H) 04/15/2022   ESRSEDRATE 56 (H) 05/10/2022   CRP 9.4 (H) 05/10/2022   REPTSTATUS 06/27/2022 FINAL 06/22/2022   CULT  06/22/2022    NO GROWTH 5 DAYS Performed at [facility], [place] [address], [place], [place] [address]      Lab Results  Component Value Date   ALBUMIN 2.8 (L) 06/29/2022   ALBUMIN 2.9 (L) 06/27/2022   ALBUMIN 2.7 (L) 06/25/2022    Lab Results  Component Value Date   MG 1.9 06/10/2022   MG 1.8 06/08/2022   MG 1.8 06/06/2022   Lab Results  Component Value Date   VD25OH 37 10/13/2011    No results found for: "PREALBUMIN"    Latest Ref Rng & Units 06/29/2022    7:20 AM 06/27/2022    3:42 AM 06/25/2022    7:51 AM  CBC EXTENDED  WBC 4.0 - 10.5 K/uL 13.4  13.7  10.9   RBC 3.87 - 5.11 MIL/uL 2.99  3.00  3.22   Hemoglobin 12.0 - 15.0 g/dL 9.7  9.7  10.2   HCT 36.0 - 46.0 % 27.9  27.8  30.2   Platelets 150 - 400 K/uL 358  364  322      There is no height or  weight on file to calculate BMI.  Orders:  No orders of the defined types were placed in this encounter.  No orders of the defined types were placed in this encounter.    Procedures: No procedures performed  Clinical Data: No additional findings.  ROS:  All other systems negative, except as noted in the HPI. Review of Systems  Objective: Vital Signs: There were no vitals taken for this visit.  Specialty Comments:  No specialty comments available.  PMFS History: Patient Active Problem List   Diagnosis Date Noted   Urinary tract infection without hematuria    Diabetes mellitus due to underlying condition with hypoglycemia without coma, with long-term current use of insulin (HCC)    Acute encephalopathy 06/22/2022   Acute respiratory failure ([place])    Unresponsiveness    Pressure ulcer 06/07/2022   Acute stroke due to ischemia ([place]) 06/03/2022   Chest  pain 06/01/2022   History of CVA (cerebrovascular accident) 06/01/2022   Seizure ([place])    Encephalopathy    Below-knee amputation of right lower extremity ([name])    Leukocytosis    Foot infection 05/10/2022   PAD (peripheral artery disease) ([place]) 05/10/2022   Gangrene of right foot ([place]) 04/15/2022   Left hemiparesis ([place]) 09/24/2021   Abnormality of gait 09/24/2021   Pain of right hand 08/06/2021   Steal syndrome of dialysis vascular access ([place]) 08/06/2021   Subclavian steal syndrome of right subclavian artery 06/25/2021   Right hand weakness 06/25/2021   Stroke-like symptoms 05/06/2021   Bandemia    Cerebrovascular accident (CVA) of right basal ganglia ([place]) 04/20/2021   External hemorrhoids    Basal ganglia infarction ([place]) 04/16/2021   ESRD on dialysis ([place]) 04/16/2021   Hypertensive urgency 03/10/2021   Hilar enlargement    Anemia of chronic disease    Chronic diastolic CHF (congestive heart failure) ([place]) 03/09/2021   CKD (chronic kidney disease), stage V ([place]) 03/08/2021   Diabetic retinopathy ([place]) 03/08/2021   Hyperglycemia due to type 2 diabetes mellitus ([place]) 03/08/2021   Pure hypercholesterolemia 03/08/2021   Anemia in chronic kidney disease 09/24/2020   Uncontrolled type 2 diabetes mellitus with hyperglycemia ([place]) 03/19/2019   Osteopenia 09/04/2018   Migraine 09/04/2018   Asthma 09/04/2018   Pseudophakia of both eyes 07/17/2018   Pseudophakia of left eye 07/03/2018   Open angle with borderline findings and high glaucoma risk in left eye 07/03/2018   Age-related nuclear cataract of right eye 07/03/2018   CAD (coronary artery disease), native coronary artery 04/27/2017   Abnormal nuclear stress test 04/04/2017   Shortness of breath 03/09/2017   Appendicitis    Age-related hypermature cataract of both eyes 12/20/2016   Uterine leiomyoma 11/27/2012   Dyslipidemia (high LDL; low HDL) 11/25/2011   Obesity (BMI 30-39.9) 11/25/2011   Hypertensive disorder  11/21/2011   Type 2 diabetes mellitus ([place]) 11/21/2011   Abnormal EKG 11/21/2011   Past Medical History:  Diagnosis Date   Acute GI bleeding    Allergy    Anemia    Anterior chest wall pain    Appendicitis 1965   Asthma    Body mass index 37.0-37.9, adult    Breast pain    Cataract    both eyes   CHF (congestive heart failure) ([place])    Chronic kidney disease    stage 5 - on dialysis   Cognitive change 04/20/2021   r/t cva 02/4007   Complication of anesthesia    Memory loss after general   Dehydration 2014  Deviated septum 1971   Diabetes mellitus    Dysphagia due to old stroke    easy to get strangled when eating   Dyspnea 2014   Extrinsic asthma    WITH ASTHMA ATTACK   Fibroid 1980   GERD (gastroesophageal reflux disease)    Heart murmur    History of migraine    History of seizure    with stroke   Hx gestational diabetes    Hyperlipidemia    Hypertension 2014   Inguinal hernia 1959   Malaise and fatigue 2014   Non-IgE mediated allergic asthma 2014   Obesity    Pelvic pain    Pregnancy, high-risk 1985   Stroke ([place]) 04/20/2021   (CVA) of right basal ganglia   Tonsillitis 1968   Uterine fibroid 1980   Visual field defect    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    Family History  Problem Relation Age of Onset   Cancer Mother        abdominal melamona   Psoriasis Mother    Alzheimer's disease Father    Cancer Cousin        colon    Diabetes Cousin    Diabetes Maternal Aunt    Colon cancer Neg Hx    Colon polyps Neg Hx    Esophageal cancer Neg Hx    Rectal cancer Neg Hx    Stomach cancer Neg Hx    Breast cancer Neg Hx     Past Surgical History:  Procedure Laterality Date   ABDOMINAL AORTOGRAM W/LOWER EXTREMITY N/A 02/21/2022   Procedure: ABDOMINAL AORTOGRAM W/LOWER EXTREMITY;  Surgeon: Waynetta Sandy, MD;  Location: [facility];  Service: Cardiovascular;  Laterality: N/A;   AMPUTATION Right 05/18/2022   Procedure: RIGHT BELOW KNEE AMPUTATION;   Surgeon: Newt Minion, MD;  Location: [facility];  Service: Orthopedics;  Laterality: Right;   AMPUTATION TOE Right 04/15/2022   Procedure: AMPUTATION  LST TOERIGHT FOOT;  Surgeon: Criselda Peaches, DPM;  Location: [facility];  Service: Podiatry;  Laterality: Right;   APPENDECTOMY  7425   BASCILIC VEIN TRANSPOSITION Right 04/30/2021   Procedure: RIGHT FIRST STAGE [place];  Surgeon: Angelia Mould, MD;  Location: [facility];  Service: Vascular;  Laterality: Right;   CESAREAN SECTION  1985   COLONOSCOPY     ESOPHAGOGASTRODUODENOSCOPY (EGD) WITH PROPOFOL N/A 04/18/2021   Procedure: ESOPHAGOGASTRODUODENOSCOPY (EGD) WITH PROPOFOL;  Surgeon: Gatha Mayer, MD;  Location: [facility];  Service: Endoscopy;  Laterality: N/A;   EYE SURGERY     bilateral cataract    FLEXIBLE SIGMOIDOSCOPY N/A 04/18/2021   Procedure: FLEXIBLE SIGMOIDOSCOPY;  Surgeon: Gatha Mayer, MD;  Location[facility];  Service: Endoscopy;  Laterality: N/A;   HERNIA REPAIR  1959   IR FLUORO GUIDE CV LINE RIGHT  03/18/2021   IR US GUIDE VASC ACCESS RIGHT  03/18/2021   LEFT HEART CATH AND CORONARY ANGIOGRAPHY N/A 04/04/2017   Procedure: Left Heart Cath and Coronary Angiography;  Surgeon: Belva Crome, MD;  Location: [facility];  Service: Cardiovascular;  Laterality: N/A;   [place], 2004, 2007   PERIPHERAL VASCULAR THROMBECTOMY  02/21/2022   Procedure: PERIPHERAL VASCULAR THROMBECTOMY;  Surgeon: Waynetta Sandy, MD;  Location: [facility];  Service: Cardiovascular;;  Right Tibial   RHINOPLASTY  1971   ROTATOR CUFF REPAIR  2003   SURGICAL REPAIR OF HEMORRHAGE  2015   TONSILLECTOMY  1968   Social History  Occupational History   Occupation: retired  Tobacco Use   Smoking status: Never   Smokeless tobacco: Never  Vaping Use   Vaping Use: Never used  Substance and Sexual Activity   Alcohol use: No   Drug use: No   Sexual activity: Not Currently    Birth control/protection:  Post-menopausal

## 2022-09-19 DIAGNOSIS — Z992 Dependence on renal dialysis: Secondary | ICD-10-CM | POA: Diagnosis not present

## 2022-09-19 DIAGNOSIS — N2581 Secondary hyperparathyroidism of renal origin: Secondary | ICD-10-CM | POA: Diagnosis not present

## 2022-09-19 DIAGNOSIS — N186 End stage renal disease: Secondary | ICD-10-CM | POA: Diagnosis not present

## 2022-09-20 ENCOUNTER — Ambulatory Visit (INDEPENDENT_AMBULATORY_CARE_PROVIDER_SITE_OTHER): Payer: Medicare PPO | Admitting: Orthopedic Surgery

## 2022-09-20 ENCOUNTER — Encounter: Payer: Self-pay | Admitting: Orthopedic Surgery

## 2022-09-20 DIAGNOSIS — T8781 Dehiscence of amputation stump: Secondary | ICD-10-CM | POA: Diagnosis not present

## 2022-09-20 DIAGNOSIS — S88111A Complete traumatic amputation at level between knee and ankle, right lower leg, initial encounter: Secondary | ICD-10-CM

## 2022-09-20 NOTE — Progress Notes (Signed)
Office Visit Note   Patient: Carolyn Russo           Date of Birth: May 27, 1951           MRN: [id number] Visit Date: 09/20/2022              Requested by: No referring provider defined for this encounter. PCP: Pcp, No  Chief Complaint  Patient presents with   Right Leg - Follow-up    05/18/2022 right BKA Kerecis graft applied in office 08/16/2022, 08/23/22, and 09/13/22      HPI: Patient is a 71 year old woman who presents status post wound dehiscence right below-knee amputation she has had Kerecis graft applied in the office on the 24th 31st and 21st.  She is 4 months out from her amputation.  Assessment & Plan: Visit Diagnoses:  1. Dehiscence of amputation stump (HCC)   2. Below-knee amputation of right lower extremity (HCC)     Plan: Kerecis powder was applied after wound debridement.  Mepilex dressing applied plus compression.  Follow-Up Instructions: Return in about 1 week (around 09/27/2022).   Ortho Exam  Patient is alert, oriented, no adenopathy, well-dressed, normal affect, normal respiratory effort. Examination the wound bed has some fibrinous exudate.  This was debrided back to bleeding viable tissue after debridement the wound is 10 x 20 mm and 10 mm deep.  The wound healing has stalled, the wound bed has healthy granulation tissue, and patient presents for evaluation and application of [place] Micro graft. After informed consent a 10 blade knife was used to debride the skin and soft tissue to healthy viable bleeding granulation tissue.  Silver nitrate was used for hemostasis. The wound measures: 1 cm in length, 2cm  in width, 10 mm in depth, wound location anterior lateral BKA right Kerecis Mari[employer] micro tissue graft 8 cm2 was applied, and there was no wastage.  Please see the photo below of the Lot number and expiration date. The micro tissue graft was covered with a nonadherent Adaptic dressing, bolstered with 4 x 4 gauze and secured with a compression  wrap.     Imaging: No results found.     Labs: Lab Results  Component Value Date   HGBA1C 4.6 (L) 06/04/2022   HGBA1C 4.7 (L) 06/03/2022   HGBA1C 5.9 (H) 04/15/2022   ESRSEDRATE 56 (H) 05/10/2022   CRP 9.4 (H) 05/10/2022   REPTSTATUS 06/27/2022 FINAL 06/22/2022   CULT  06/22/2022    NO GROWTH 5 DAYS Performed at [facility], [place] [address], [place], [place] [address]      Lab Results  Component Value Date   ALBUMIN 2.8 (L) 06/29/2022   ALBUMIN 2.9 (L) 06/27/2022   ALBUMIN 2.7 (L) 06/25/2022    Lab Results  Component Value Date   MG 1.9 06/10/2022   MG 1.8 06/08/2022   MG 1.8 06/06/2022   Lab Results  Component Value Date   VD25OH 37 10/13/2011    No results found for: "PREALBUMIN"    Latest Ref Rng & Units 06/29/2022    7:20 AM 06/27/2022    3:42 AM 06/25/2022    7:51 AM  CBC EXTENDED  WBC 4.0 - 10.5 K/uL 13.4  13.7  10.9   RBC 3.87 - 5.11 MIL/uL 2.99  3.00  3.22   Hemoglobin 12.0 - 15.0 g/dL 9.7  9.7  10.2   HCT 36.0 - 46.0 % 27.9  27.8  30.2   Platelets 150 - 400 K/uL 358  364  322      There is no height or weight on file to calculate BMI.  Orders:  No orders of the defined types were placed in this encounter.  No orders of the defined types were placed in this encounter.    Procedures: No procedures performed  Clinical Data: No additional findings.  ROS:  All other systems negative, except as noted in the HPI. Review of Systems  Objective: Vital Signs: There were no vitals taken for this visit.  Specialty Comments:  No specialty comments available.  PMFS History: Patient Active Problem List   Diagnosis Date Noted   Urinary tract infection without hematuria    Diabetes mellitus due to underlying condition with hypoglycemia without coma, with long-term current use of insulin (HCC)    Acute encephalopathy 06/22/2022   Acute respiratory failure ([place])    Unresponsiveness    Pressure ulcer 06/07/2022   Acute stroke due to  ischemia ([place]) 06/03/2022   Chest pain 06/01/2022   History of CVA (cerebrovascular accident) 06/01/2022   Seizure ([place])    Encephalopathy    Below-knee amputation of right lower extremity ([place])    Leukocytosis    Foot infection 05/10/2022   PAD (peripheral artery disease) ([place]) 05/10/2022   Gangrene of right foot ([place]) 04/15/2022   Left hemiparesis ([place]) 09/24/2021   Abnormality of gait 09/24/2021   Pain of right hand 08/06/2021   Steal syndrome of dialysis vascular access ([place]) 08/06/2021   Subclavian steal syndrome of right subclavian artery 06/25/2021   Right hand weakness 06/25/2021   Stroke-like symptoms 05/06/2021   Bandemia    Cerebrovascular accident (CVA) of right basal ganglia ([place]) 04/20/2021   External hemorrhoids    Basal ganglia infarction ([place]) 04/16/2021   ESRD on dialysis ([place]) 04/16/2021   Hypertensive urgency 03/10/2021   Hilar enlargement    Anemia of chronic disease    Chronic diastolic CHF (congestive heart failure) ([place]) 03/09/2021   CKD (chronic kidney disease), stage V ([place]) 03/08/2021   Diabetic retinopathy ([place]) 03/08/2021   Hyperglycemia due to type 2 diabetes mellitus ([place]) 03/08/2021   Pure hypercholesterolemia 03/08/2021   Anemia in chronic kidney disease 09/24/2020   Uncontrolled type 2 diabetes mellitus with hyperglycemia ([place]) 03/19/2019   Osteopenia 09/04/2018   Migraine 09/04/2018   Asthma 09/04/2018   Pseudophakia of both eyes 07/17/2018   Pseudophakia of left eye 07/03/2018   Open angle with borderline findings and high glaucoma risk in left eye 07/03/2018   Age-related nuclear cataract of right eye 07/03/2018   CAD (coronary artery disease), native coronary artery 04/27/2017   Abnormal nuclear stress test 04/04/2017   Shortness of breath 03/09/2017   Appendicitis    Age-related hypermature cataract of both eyes 12/20/2016   Uterine leiomyoma 11/27/2012   Dyslipidemia (high LDL; low HDL) 11/25/2011   Obesity (BMI 30-39.9) 11/25/2011    Hypertensive disorder 11/21/2011   Type 2 diabetes mellitus ([place]) 11/21/2011   Abnormal EKG 11/21/2011   Past Medical History:  Diagnosis Date   Acute GI bleeding    Allergy    Anemia    Anterior chest wall pain    Appendicitis 1965   Asthma    Body mass index 37.0-37.9, adult    Breast pain    Cataract    both eyes   CHF (congestive heart failure) ([place])    Chronic kidney disease    stage 5 - on dialysis   Cognitive change 04/20/2021   r/t cva 06/3234   Complication of anesthesia  Memory loss after general   Dehydration 2014   Deviated septum 1971   Diabetes mellitus    Dysphagia due to old stroke    easy to get strangled when eating   Dyspnea 2014   Extrinsic asthma    WITH ASTHMA ATTACK   Fibroid 1980   GERD (gastroesophageal reflux disease)    Heart murmur    History of migraine    History of seizure    with stroke   Hx gestational diabetes    Hyperlipidemia    Hypertension 2014   Inguinal hernia 1959   Malaise and fatigue 2014   Non-IgE mediated allergic asthma 2014   Obesity    Pelvic pain    Pregnancy, high-risk 1985   Stroke ([place]) 04/20/2021   (CVA) of right basal ganglia   Tonsillitis 1968   Uterine fibroid 1980   Visual field defect    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    Family History  Problem Relation Age of Onset   Cancer Mother        abdominal melamona   Psoriasis Mother    Alzheimer's disease Father    Cancer Cousin        colon    Diabetes Cousin    Diabetes Maternal Aunt    Colon cancer Neg Hx    Colon polyps Neg Hx    Esophageal cancer Neg Hx    Rectal cancer Neg Hx    Stomach cancer Neg Hx    Breast cancer Neg Hx     Past Surgical History:  Procedure Laterality Date   ABDOMINAL AORTOGRAM W/LOWER EXTREMITY N/A 02/21/2022   Procedure: ABDOMINAL AORTOGRAM W/LOWER EXTREMITY;  Surgeon: Waynetta Sandy, MD;  Location: [facility];  Service: Cardiovascular;  Laterality: N/A;   AMPUTATION Right 05/18/2022   Procedure: RIGHT  BELOW KNEE AMPUTATION;  Surgeon: Newt Minion, MD;  Location: [facility];  Service: Orthopedics;  Laterality: Right;   AMPUTATION TOE Right 04/15/2022   Procedure: AMPUTATION  LST TOERIGHT FOOT;  Surgeon: Criselda Peaches, DPM;  Location[facility];  Service: Podiatry;  Laterality: Right;   APPENDECTOMY  2229   BASCILIC VEIN TRANSPOSITION Right 04/30/2021   Procedure: RIGHT FIRST STAGE [place];  Surgeon: Angelia Mould, MD;  Location: [facility];  Service: Vascular;  Laterality: Right;   CESAREAN SECTION  1985   COLONOSCOPY     ESOPHAGOGASTRODUODENOSCOPY (EGD) WITH PROPOFOL N/A 04/18/2021   Procedure: ESOPHAGOGASTRODUODENOSCOPY (EGD) WITH PROPOFOL;  Surgeon: Gatha Mayer, MD;  Location: [facility];  Service: Endoscopy;  Laterality: N/A;   EYE SURGERY     bilateral cataract    FLEXIBLE SIGMOIDOSCOPY N/A 04/18/2021   Procedure: FLEXIBLE SIGMOIDOSCOPY;  Surgeon: Gatha Mayer, MD;  Location: [facility]COPY;  Service: Endoscopy;  Laterality: N/A;   HERNIA REPAIR  1959   IR FLUORO GUIDE CV LINE RIGHT  03/18/2021   IR US GUIDE VASC ACCESS RIGHT  03/18/2021   LEFT HEART CATH AND CORONARY ANGIOGRAPHY N/A 04/04/2017   Procedure: Left Heart Cath and Coronary Angiography;  Surgeon: Belva Crome, MD;  Location: [facility];  Service: Cardiovascular;  Laterality: N/A;   [place], 2004, 2007   PERIPHERAL VASCULAR THROMBECTOMY  02/21/2022   Procedure: PERIPHERAL VASCULAR THROMBECTOMY;  Surgeon: Waynetta Sandy, MD;  Location: [facility];  Service: Cardiovascular;;  Right Tibial   RHINOPLASTY  1971   ROTATOR CUFF REPAIR  2003   SURGICAL REPAIR OF HEMORRHAGE  2015  TONSILLECTOMY  1968   Social History   Occupational History   Occupation: retired  Tobacco Use   Smoking status: Never   Smokeless tobacco: Never  Vaping Use   Vaping Use: Never used  Substance and Sexual Activity   Alcohol use: No   Drug use: No   Sexual activity: Not Currently     Birth control/protection: Post-menopausal

## 2022-09-21 DIAGNOSIS — N186 End stage renal disease: Secondary | ICD-10-CM | POA: Diagnosis not present

## 2022-09-21 DIAGNOSIS — N2581 Secondary hyperparathyroidism of renal origin: Secondary | ICD-10-CM | POA: Diagnosis not present

## 2022-09-21 DIAGNOSIS — Z992 Dependence on renal dialysis: Secondary | ICD-10-CM | POA: Diagnosis not present

## 2022-09-22 ENCOUNTER — Ambulatory Visit: Payer: Medicare Other

## 2022-09-22 DIAGNOSIS — Z992 Dependence on renal dialysis: Secondary | ICD-10-CM | POA: Diagnosis not present

## 2022-09-22 DIAGNOSIS — N186 End stage renal disease: Secondary | ICD-10-CM | POA: Diagnosis not present

## 2022-09-22 DIAGNOSIS — E1122 Type 2 diabetes mellitus with diabetic chronic kidney disease: Secondary | ICD-10-CM | POA: Diagnosis not present

## 2022-09-23 DIAGNOSIS — N186 End stage renal disease: Secondary | ICD-10-CM | POA: Diagnosis not present

## 2022-09-23 DIAGNOSIS — Z992 Dependence on renal dialysis: Secondary | ICD-10-CM | POA: Diagnosis not present

## 2022-09-23 DIAGNOSIS — N2581 Secondary hyperparathyroidism of renal origin: Secondary | ICD-10-CM | POA: Diagnosis not present

## 2022-09-26 DIAGNOSIS — N2581 Secondary hyperparathyroidism of renal origin: Secondary | ICD-10-CM | POA: Diagnosis not present

## 2022-09-26 DIAGNOSIS — N186 End stage renal disease: Secondary | ICD-10-CM | POA: Diagnosis not present

## 2022-09-26 DIAGNOSIS — Z992 Dependence on renal dialysis: Secondary | ICD-10-CM | POA: Diagnosis not present

## 2022-09-27 ENCOUNTER — Encounter: Payer: Self-pay | Admitting: Orthopedic Surgery

## 2022-09-27 ENCOUNTER — Ambulatory Visit (INDEPENDENT_AMBULATORY_CARE_PROVIDER_SITE_OTHER): Payer: Medicare PPO | Admitting: Orthopedic Surgery

## 2022-09-27 DIAGNOSIS — S88111A Complete traumatic amputation at level between knee and ankle, right lower leg, initial encounter: Secondary | ICD-10-CM

## 2022-09-27 DIAGNOSIS — T8781 Dehiscence of amputation stump: Secondary | ICD-10-CM

## 2022-09-27 NOTE — Progress Notes (Signed)
Office Visit Note   Patient: Carolyn Russo           Date of Birth: Oct 16, 1951           MRN: [id number] Visit Date: 09/27/2022              Requested by: No referring provider defined for this encounter. PCP: Pcp, No  Chief Complaint  Patient presents with   Right Leg - Follow-up    05/18/2022 right BKA with Kerecis  In office kerecis 08/16/22, 08/23/2022, 09/13/22 and 09/20/22      HPI: Patient is a 71 year old woman who is seen in follow-up status post right below-knee amputation with wound dehiscence.  She has undergone an office application of Kerecis micro graft 4 times.  She is currently in a shrinker ace wrap and gauze dressing.  Assessment & Plan: Visit Diagnoses:  1. Dehiscence of amputation stump (HCC)   2. Below-knee amputation of right lower extremity ([place])     Plan: Patient has shown excellent healing with using the Kerecis micro graft.  We will have her continue with Dial soap cleansing wearing the stump shrinker.  She will follow-up with [facility] to obtain a smaller shrinker.  Follow-Up Instructions: Return in about 4 weeks (around 10/25/2022).   Ortho Exam  Patient is alert, oriented, no adenopathy, well-dressed, normal affect, normal respiratory effort. Examination the area of wound dehiscence has healed significantly.  The lateral scab was removed and this has completely epithelialized.  The wound bed after gentle debridement shows a wound that is 10 x 5 mm and 3 mm deep there is excellent granulation tissue at the base of the wound.  Imaging: No results found.   Labs: Lab Results  Component Value Date   HGBA1C 4.6 (L) 06/04/2022   HGBA1C 4.7 (L) 06/03/2022   HGBA1C 5.9 (H) 04/15/2022   ESRSEDRATE 56 (H) 05/10/2022   CRP 9.4 (H) 05/10/2022   REPTSTATUS 06/27/2022 FINAL 06/22/2022   CULT  06/22/2022    NO GROWTH 5 DAYS Performed at [facility], [place] [address] [place],  [address]      Lab Results  Component Value Date   ALBUMIN  2.8 (L) 06/29/2022   ALBUMIN 2.9 (L) 06/27/2022   ALBUMIN 2.7 (L) 06/25/2022    Lab Results  Component Value Date   MG 1.9 06/10/2022   MG 1.8 06/08/2022   MG 1.8 06/06/2022   Lab Results  Component Value Date   VD25OH 37 10/13/2011    No results found for: "PREALBUMIN"    Latest Ref Rng & Units 06/29/2022    7:20 AM 06/27/2022    3:42 AM 06/25/2022    7:51 AM  CBC EXTENDED  WBC 4.0 - 10.5 K/uL 13.4  13.7  10.9   RBC 3.87 - 5.11 MIL/uL 2.99  3.00  3.22   Hemoglobin 12.0 - 15.0 g/dL 9.7  9.7  10.2   HCT 36.0 - 46.0 % 27.9  27.8  30.2   Platelets 150 - 400 K/uL 358  364  322      There is no height or weight on file to calculate BMI.  Orders:  No orders of the defined types were placed in this encounter.  No orders of the defined types were placed in this encounter.    Procedures: No procedures performed  Clinical Data: No additional findings.  ROS:  All other systems negative, except as noted in the HPI. Review of Systems  Objective: Vital Signs: There were  no vitals taken for this visit.  Specialty Comments:  No specialty comments available.  PMFS History: Patient Active Problem List   Diagnosis Date Noted   Urinary tract infection without hematuria    Diabetes mellitus due to underlying condition with hypoglycemia without coma, with long-term current use of insulin (HCC)    Acute encephalopathy 06/22/2022   Acute respiratory failure ([place])    Unresponsiveness    Pressure ulcer 06/07/2022   Acute stroke due to ischemia ([place]) 06/03/2022   Chest pain 06/01/2022   History of CVA (cerebrovascular accident) 06/01/2022   Seizure ([place])    Encephalopathy    Below-knee amputation of right lower extremity ([place])    Leukocytosis    Foot infection 05/10/2022   PAD (peripheral artery disease) ([place]) 05/10/2022   Gangrene of right foot ([place]) 04/15/2022   Left hemiparesis ([place]) 09/24/2021   Abnormality of gait 09/24/2021   Pain of right hand 08/06/2021   Steal  syndrome of dialysis vascular access ([place]) 08/06/2021   Subclavian steal syndrome of right subclavian artery 06/25/2021   Right hand weakness 06/25/2021   Stroke-like symptoms 05/06/2021   Bandemia    Cerebrovascular accident (CVA) of right basal ganglia ([place]) 04/20/2021   External hemorrhoids    Basal ganglia infarction ([place]) 04/16/2021   ESRD on dialysis ([place]) 04/16/2021   Hypertensive urgency 03/10/2021   Hilar enlargement    Anemia of chronic disease    Chronic diastolic CHF (congestive heart failure) ([place]) 03/09/2021   CKD (chronic kidney disease), stage V ([place]) 03/08/2021   Diabetic retinopathy ([place]) 03/08/2021   Hyperglycemia due to type 2 diabetes mellitus ([place]) 03/08/2021   Pure hypercholesterolemia 03/08/2021   Anemia in chronic kidney disease 09/24/2020   Uncontrolled type 2 diabetes mellitus with hyperglycemia ([place]) 03/19/2019   Osteopenia 09/04/2018   Migraine 09/04/2018   Asthma 09/04/2018   Pseudophakia of both eyes 07/17/2018   Pseudophakia of left eye 07/03/2018   Open angle with borderline findings and high glaucoma risk in left eye 07/03/2018   Age-related nuclear cataract of right eye 07/03/2018   CAD (coronary artery disease), native coronary artery 04/27/2017   Abnormal nuclear stress test 04/04/2017   Shortness of breath 03/09/2017   Appendicitis    Age-related hypermature cataract of both eyes 12/20/2016   Uterine leiomyoma 11/27/2012   Dyslipidemia (high LDL; low HDL) 11/25/2011   Obesity (BMI 30-39.9) 11/25/2011   Hypertensive disorder 11/21/2011   Type 2 diabetes mellitus ([place]) 11/21/2011   Abnormal EKG 11/21/2011   Past Medical History:  Diagnosis Date   Acute GI bleeding    Allergy    Anemia    Anterior chest wall pain    Appendicitis 1965   Asthma    Body mass index 37.0-37.9, adult    Breast pain    Cataract    both eyes   CHF (congestive heart failure) ([place])    Chronic kidney disease    stage 5 - on dialysis   Cognitive change  04/20/2021   r/t cva 10/6107   Complication of anesthesia    Memory loss after general   Dehydration 2014   Deviated septum 1971   Diabetes mellitus    Dysphagia due to old stroke    easy to get strangled when eating   Dyspnea 2014   Extrinsic asthma    WITH ASTHMA ATTACK   Fibroid 1980   GERD (gastroesophageal reflux disease)    Heart murmur    History of migraine    History of seizure  with stroke   Hx gestational diabetes    Hyperlipidemia    Hypertension 2014   Inguinal hernia 1959   Malaise and fatigue 2014   Non-IgE mediated allergic asthma 2014   Obesity    Pelvic pain    Pregnancy, high-risk 1985   Stroke ([place]) 04/20/2021   (CVA) of right basal ganglia   Tonsillitis 1968   Uterine fibroid 1980   Visual field defect    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    Family History  Problem Relation Age of Onset   Cancer Mother        abdominal melamona   Psoriasis Mother    Alzheimer's disease Father    Cancer Cousin        colon    Diabetes Cousin    Diabetes Maternal Aunt    Colon cancer Neg Hx    Colon polyps Neg Hx    Esophageal cancer Neg Hx    Rectal cancer Neg Hx    Stomach cancer Neg Hx    Breast cancer Neg Hx     Past Surgical History:  Procedure Laterality Date   ABDOMINAL AORTOGRAM W/LOWER EXTREMITY N/A 02/21/2022   Procedure: ABDOMINAL AORTOGRAM W/LOWER EXTREMITY;  Surgeon: Waynetta Sandy, MD;  Location: [place] [facility];  Service: Cardiovascular;  Laterality: N/A;   AMPUTATION Right 05/18/2022   Procedure: RIGHT BELOW KNEE AMPUTATION;  Surgeon: Newt Minion, MD;  Location: [place];  Service: Orthopedics;  Laterality: Right;   AMPUTATION TOE Right 04/15/2022   Procedure: AMPUTATION  LST TOERIGHT FOOT;  Surgeon: Criselda Peaches, DPM;  Location: [facility];  Service: Podiatry;  Laterality: Right;   APPENDECTOMY  5027   BASCILIC VEIN TRANSPOSITION Right 04/30/2021   Procedure: RIGHT FIRST STAGE [place];  Surgeon: Angelia Mould,  MD;  Location: [place];  Service: Vascular;  Laterality: Right;   CESAREAN SECTION  1985   COLONOSCOPY     ESOPHAGOGASTRODUODENOSCOPY (EGD) WITH PROPOFOL N/A 04/18/2021   Procedure: ESOPHAGOGASTRODUODENOSCOPY (EGD) WITH PROPOFOL;  Surgeon: Gatha Mayer, MD;  Location: [place];  Service: Endoscopy;  Laterality: N/A;   EYE SURGERY     bilateral cataract    FLEXIBLE SIGMOIDOSCOPY N/A 04/18/2021   Procedure: FLEXIBLE SIGMOIDOSCOPY;  Surgeon: Gatha Mayer, MD;  Location: [facility];  Service: Endoscopy;  Laterality: N/A;   HERNIA REPAIR  1959   IR FLUORO GUIDE CV LINE RIGHT  03/18/2021   IR US GUIDE VASC ACCESS RIGHT  03/18/2021   LEFT HEART CATH AND CORONARY ANGIOGRAPHY N/A 04/04/2017   Procedure: Left Heart Cath and Coronary Angiography;  Surgeon: Belva Crome, MD;  Location: [place] [facility];  Service: Cardiovascular;  Laterality: N/A;   [place], 2004, 2007   PERIPHERAL VASCULAR THROMBECTOMY  02/21/2022   Procedure: PERIPHERAL VASCULAR THROMBECTOMY;  Surgeon: Waynetta Sandy, MD;  Location: [place] [facility];  Service: Cardiovascular;;  Right Tibial   RHINOPLASTY  1971   ROTATOR CUFF REPAIR  2003   SURGICAL REPAIR OF HEMORRHAGE  2015   TONSILLECTOMY  1968   Social History   Occupational History   Occupation: retired  Tobacco Use   Smoking status: Never   Smokeless tobacco: Never  Vaping Use   Vaping Use: Never used  Substance and Sexual Activity   Alcohol use: No   Drug use: No   Sexual activity: Not Currently    Birth control/protection: Post-menopausal

## 2022-09-28 DIAGNOSIS — Z992 Dependence on renal dialysis: Secondary | ICD-10-CM | POA: Diagnosis not present

## 2022-09-28 DIAGNOSIS — N2581 Secondary hyperparathyroidism of renal origin: Secondary | ICD-10-CM | POA: Diagnosis not present

## 2022-09-28 DIAGNOSIS — N186 End stage renal disease: Secondary | ICD-10-CM | POA: Diagnosis not present

## 2022-09-30 ENCOUNTER — Ambulatory Visit: Payer: Medicare PPO | Admitting: Physical Medicine and Rehabilitation

## 2022-09-30 DIAGNOSIS — Z992 Dependence on renal dialysis: Secondary | ICD-10-CM | POA: Diagnosis not present

## 2022-09-30 DIAGNOSIS — N186 End stage renal disease: Secondary | ICD-10-CM | POA: Diagnosis not present

## 2022-09-30 DIAGNOSIS — N2581 Secondary hyperparathyroidism of renal origin: Secondary | ICD-10-CM | POA: Diagnosis not present

## 2022-10-02 DIAGNOSIS — E1165 Type 2 diabetes mellitus with hyperglycemia: Secondary | ICD-10-CM | POA: Diagnosis not present

## 2022-10-03 DIAGNOSIS — N2581 Secondary hyperparathyroidism of renal origin: Secondary | ICD-10-CM | POA: Diagnosis not present

## 2022-10-03 DIAGNOSIS — Z992 Dependence on renal dialysis: Secondary | ICD-10-CM | POA: Diagnosis not present

## 2022-10-03 DIAGNOSIS — N186 End stage renal disease: Secondary | ICD-10-CM | POA: Diagnosis not present

## 2022-10-05 DIAGNOSIS — Z992 Dependence on renal dialysis: Secondary | ICD-10-CM | POA: Diagnosis not present

## 2022-10-05 DIAGNOSIS — N186 End stage renal disease: Secondary | ICD-10-CM | POA: Diagnosis not present

## 2022-10-05 DIAGNOSIS — N2581 Secondary hyperparathyroidism of renal origin: Secondary | ICD-10-CM | POA: Diagnosis not present

## 2022-10-06 ENCOUNTER — Ambulatory Visit: Payer: Medicare PPO | Admitting: Adult Health

## 2022-10-08 DIAGNOSIS — Z992 Dependence on renal dialysis: Secondary | ICD-10-CM | POA: Diagnosis not present

## 2022-10-08 DIAGNOSIS — N186 End stage renal disease: Secondary | ICD-10-CM | POA: Diagnosis not present

## 2022-10-08 DIAGNOSIS — N2581 Secondary hyperparathyroidism of renal origin: Secondary | ICD-10-CM | POA: Diagnosis not present

## 2022-10-10 DIAGNOSIS — Z992 Dependence on renal dialysis: Secondary | ICD-10-CM | POA: Diagnosis not present

## 2022-10-10 DIAGNOSIS — N2581 Secondary hyperparathyroidism of renal origin: Secondary | ICD-10-CM | POA: Diagnosis not present

## 2022-10-10 DIAGNOSIS — N186 End stage renal disease: Secondary | ICD-10-CM | POA: Diagnosis not present

## 2022-10-11 DIAGNOSIS — E1165 Type 2 diabetes mellitus with hyperglycemia: Secondary | ICD-10-CM | POA: Diagnosis not present

## 2022-10-11 DIAGNOSIS — N2581 Secondary hyperparathyroidism of renal origin: Secondary | ICD-10-CM | POA: Diagnosis not present

## 2022-10-11 DIAGNOSIS — E7800 Pure hypercholesterolemia, unspecified: Secondary | ICD-10-CM | POA: Diagnosis not present

## 2022-10-11 DIAGNOSIS — N186 End stage renal disease: Secondary | ICD-10-CM | POA: Diagnosis not present

## 2022-10-11 DIAGNOSIS — I10 Essential (primary) hypertension: Secondary | ICD-10-CM | POA: Diagnosis not present

## 2022-10-11 DIAGNOSIS — E11319 Type 2 diabetes mellitus with unspecified diabetic retinopathy without macular edema: Secondary | ICD-10-CM | POA: Diagnosis not present

## 2022-10-11 DIAGNOSIS — Z992 Dependence on renal dialysis: Secondary | ICD-10-CM | POA: Diagnosis not present

## 2022-10-12 DIAGNOSIS — Z992 Dependence on renal dialysis: Secondary | ICD-10-CM | POA: Diagnosis not present

## 2022-10-12 DIAGNOSIS — N2581 Secondary hyperparathyroidism of renal origin: Secondary | ICD-10-CM | POA: Diagnosis not present

## 2022-10-12 DIAGNOSIS — N186 End stage renal disease: Secondary | ICD-10-CM | POA: Diagnosis not present

## 2022-10-13 ENCOUNTER — Other Ambulatory Visit: Payer: Self-pay | Admitting: Internal Medicine

## 2022-10-14 DIAGNOSIS — Z992 Dependence on renal dialysis: Secondary | ICD-10-CM | POA: Diagnosis not present

## 2022-10-14 DIAGNOSIS — N2581 Secondary hyperparathyroidism of renal origin: Secondary | ICD-10-CM | POA: Diagnosis not present

## 2022-10-14 DIAGNOSIS — N186 End stage renal disease: Secondary | ICD-10-CM | POA: Diagnosis not present

## 2022-10-16 DIAGNOSIS — N186 End stage renal disease: Secondary | ICD-10-CM | POA: Diagnosis not present

## 2022-10-16 DIAGNOSIS — N2581 Secondary hyperparathyroidism of renal origin: Secondary | ICD-10-CM | POA: Diagnosis not present

## 2022-10-16 DIAGNOSIS — Z992 Dependence on renal dialysis: Secondary | ICD-10-CM | POA: Diagnosis not present

## 2022-10-19 DIAGNOSIS — N2581 Secondary hyperparathyroidism of renal origin: Secondary | ICD-10-CM | POA: Diagnosis not present

## 2022-10-19 DIAGNOSIS — N186 End stage renal disease: Secondary | ICD-10-CM | POA: Diagnosis not present

## 2022-10-19 DIAGNOSIS — Z992 Dependence on renal dialysis: Secondary | ICD-10-CM | POA: Diagnosis not present

## 2022-10-20 DIAGNOSIS — R059 Cough, unspecified: Secondary | ICD-10-CM | POA: Diagnosis not present

## 2022-10-21 ENCOUNTER — Other Ambulatory Visit: Payer: Self-pay | Admitting: Internal Medicine

## 2022-10-21 DIAGNOSIS — N186 End stage renal disease: Secondary | ICD-10-CM | POA: Diagnosis not present

## 2022-10-21 DIAGNOSIS — Z992 Dependence on renal dialysis: Secondary | ICD-10-CM | POA: Diagnosis not present

## 2022-10-21 DIAGNOSIS — N2581 Secondary hyperparathyroidism of renal origin: Secondary | ICD-10-CM | POA: Diagnosis not present

## 2022-10-23 DIAGNOSIS — Z992 Dependence on renal dialysis: Secondary | ICD-10-CM | POA: Diagnosis not present

## 2022-10-23 DIAGNOSIS — N186 End stage renal disease: Secondary | ICD-10-CM | POA: Diagnosis not present

## 2022-10-23 DIAGNOSIS — E1122 Type 2 diabetes mellitus with diabetic chronic kidney disease: Secondary | ICD-10-CM | POA: Diagnosis not present

## 2022-10-23 DIAGNOSIS — N2581 Secondary hyperparathyroidism of renal origin: Secondary | ICD-10-CM | POA: Diagnosis not present

## 2022-10-26 DIAGNOSIS — N186 End stage renal disease: Secondary | ICD-10-CM | POA: Diagnosis not present

## 2022-10-26 DIAGNOSIS — N2581 Secondary hyperparathyroidism of renal origin: Secondary | ICD-10-CM | POA: Diagnosis not present

## 2022-10-26 DIAGNOSIS — Z992 Dependence on renal dialysis: Secondary | ICD-10-CM | POA: Diagnosis not present

## 2022-10-27 DIAGNOSIS — G4701 Insomnia due to medical condition: Secondary | ICD-10-CM | POA: Diagnosis not present

## 2022-10-27 DIAGNOSIS — N186 End stage renal disease: Secondary | ICD-10-CM | POA: Diagnosis not present

## 2022-10-27 DIAGNOSIS — M6281 Muscle weakness (generalized): Secondary | ICD-10-CM | POA: Diagnosis not present

## 2022-10-27 DIAGNOSIS — K5900 Constipation, unspecified: Secondary | ICD-10-CM | POA: Diagnosis not present

## 2022-10-27 DIAGNOSIS — I10 Essential (primary) hypertension: Secondary | ICD-10-CM | POA: Diagnosis not present

## 2022-10-28 DIAGNOSIS — N186 End stage renal disease: Secondary | ICD-10-CM | POA: Diagnosis not present

## 2022-10-28 DIAGNOSIS — N2581 Secondary hyperparathyroidism of renal origin: Secondary | ICD-10-CM | POA: Diagnosis not present

## 2022-10-28 DIAGNOSIS — Z992 Dependence on renal dialysis: Secondary | ICD-10-CM | POA: Diagnosis not present

## 2022-10-31 ENCOUNTER — Ambulatory Visit (INDEPENDENT_AMBULATORY_CARE_PROVIDER_SITE_OTHER): Payer: Medicare PPO | Admitting: Orthopedic Surgery

## 2022-10-31 DIAGNOSIS — Z89511 Acquired absence of right leg below knee: Secondary | ICD-10-CM

## 2022-10-31 DIAGNOSIS — N2581 Secondary hyperparathyroidism of renal origin: Secondary | ICD-10-CM | POA: Diagnosis not present

## 2022-10-31 DIAGNOSIS — N186 End stage renal disease: Secondary | ICD-10-CM | POA: Diagnosis not present

## 2022-10-31 DIAGNOSIS — S88111A Complete traumatic amputation at level between knee and ankle, right lower leg, initial encounter: Secondary | ICD-10-CM

## 2022-10-31 DIAGNOSIS — Z992 Dependence on renal dialysis: Secondary | ICD-10-CM | POA: Diagnosis not present

## 2022-11-02 DIAGNOSIS — N186 End stage renal disease: Secondary | ICD-10-CM | POA: Diagnosis not present

## 2022-11-02 DIAGNOSIS — N2581 Secondary hyperparathyroidism of renal origin: Secondary | ICD-10-CM | POA: Diagnosis not present

## 2022-11-02 DIAGNOSIS — E1165 Type 2 diabetes mellitus with hyperglycemia: Secondary | ICD-10-CM | POA: Diagnosis not present

## 2022-11-02 DIAGNOSIS — Z992 Dependence on renal dialysis: Secondary | ICD-10-CM | POA: Diagnosis not present

## 2022-11-03 ENCOUNTER — Encounter: Payer: Self-pay | Admitting: Orthopedic Surgery

## 2022-11-03 NOTE — Progress Notes (Signed)
Office Visit Note   Patient: Carolyn Russo           Date of Birth: 05-06-1951           MRN: [id number] Visit Date: 10/31/2022              Requested by: No referring provider defined for this encounter. PCP: Pcp, No  Chief Complaint  Patient presents with   Right Leg - Routine Post Op    05/18/2022 right BKA       HPI: Patient is a 72 year old woman who presents 5 months status post below-knee amputation.  Assessment & Plan: Visit Diagnoses:  1. Below-knee amputation of right lower extremity ([place])     Plan: The incision is well-healed she will follow-up with [facility] for prosthetic fitting and physical therapy upstairs.  Follow-Up Instructions: Return if symptoms worsen or fail to improve.   Ortho Exam  Patient is alert, oriented, no adenopathy, well-dressed, normal affect, normal respiratory effort. Examination of the residual limb is well-healed there is no cellulitis no drainage no signs of infection.  Patient is a new right transtibial  amputee.  Patient's current comorbidities are not expected to impact the ability to function with the prescribed prosthesis. Patient verbally communicates a strong desire to use a prosthesis. Patient currently requires mobility aids to ambulate without a prosthesis.  Expects not to use mobility aids with a new prosthesis.  Patient is a K2 level ambulator that will use a prosthesis to walk around their home and the community over low level environmental barriers.     Imaging: No results found. No images are attached to the encounter.  Labs: Lab Results  Component Value Date   HGBA1C 4.6 (L) 06/04/2022   HGBA1C 4.7 (L) 06/03/2022   HGBA1C 5.9 (H) 04/15/2022   ESRSEDRATE 56 (H) 05/10/2022   CRP 9.4 (H) 05/10/2022   REPTSTATUS 06/27/2022 FINAL 06/22/2022   CULT  06/22/2022    NO GROWTH 5 DAYS Performed at [facility], [place] [address] [place], [place] [address]      Lab Results  Component Value Date   ALBUMIN  2.8 (L) 06/29/2022   ALBUMIN 2.9 (L) 06/27/2022   ALBUMIN 2.7 (L) 06/25/2022    Lab Results  Component Value Date   MG 1.9 06/10/2022   MG 1.8 06/08/2022   MG 1.8 06/06/2022   Lab Results  Component Value Date   VD25OH 37 10/13/2011    No results found for: "PREALBUMIN"    Latest Ref Rng & Units 06/29/2022    7:20 AM 06/27/2022    3:42 AM 06/25/2022    7:51 AM  CBC EXTENDED  WBC 4.0 - 10.5 K/uL 13.4  13.7  10.9   RBC 3.87 - 5.11 MIL/uL 2.99  3.00  3.22   Hemoglobin 12.0 - 15.0 g/dL 9.7  9.7  10.2   HCT 36.0 - 46.0 % 27.9  27.8  30.2   Platelets 150 - 400 K/uL 358  364  322      There is no height or weight on file to calculate BMI.  Orders:  No orders of the defined types were placed in this encounter.  No orders of the defined types were placed in this encounter.    Procedures: No procedures performed  Clinical Data: No additional findings.  ROS:  All other systems negative, except as noted in the HPI. Review of Systems  Objective: Vital Signs: There were no vitals taken for this visit.  Specialty  Comments:  No specialty comments available.  PMFS History: Patient Active Problem List   Diagnosis Date Noted   Urinary tract infection without hematuria    Diabetes mellitus due to underlying condition with hypoglycemia without coma, with long-term current use of insulin (HCC)    Acute encephalopathy 06/22/2022   Acute respiratory failure ([place])    Unresponsiveness    Pressure ulcer 06/07/2022   Acute stroke due to ischemia ([place]) 06/03/2022   Chest pain 06/01/2022   History of CVA (cerebrovascular accident) 06/01/2022   Seizure ([place])    Encephalopathy    Below-knee amputation of right lower extremity ([place])    Leukocytosis    Foot infection 05/10/2022   PAD (peripheral artery disease) ([place]) 05/10/2022   Gangrene of right foot ([place]) 04/15/2022   Left hemiparesis ([place]) 09/24/2021   Abnormality of gait 09/24/2021   Pain of right hand 08/06/2021   Steal  syndrome of dialysis vascular access ([place]) 08/06/2021   Subclavian steal syndrome of right subclavian artery 06/25/2021   Right hand weakness 06/25/2021   Stroke-like symptoms 05/06/2021   Bandemia    Cerebrovascular accident (CVA) of right basal ganglia ([place]) 04/20/2021   External hemorrhoids    Basal ganglia infarction ([place]) 04/16/2021   ESRD on dialysis ([place]) 04/16/2021   Hypertensive urgency 03/10/2021   Hilar enlargement    Anemia of chronic disease    Chronic diastolic CHF (congestive heart failure) ([place]) 03/09/2021   CKD (chronic kidney disease), stage V ([place]) 03/08/2021   Diabetic retinopathy ([place]) 03/08/2021   Hyperglycemia due to type 2 diabetes mellitus ([place]) 03/08/2021   Pure hypercholesterolemia 03/08/2021   Anemia in chronic kidney disease 09/24/2020   Uncontrolled type 2 diabetes mellitus with hyperglycemia ([place]) 03/19/2019   Osteopenia 09/04/2018   Migraine 09/04/2018   Asthma 09/04/2018   Pseudophakia of both eyes 07/17/2018   Pseudophakia of left eye 07/03/2018   Open angle with borderline findings and high glaucoma risk in left eye 07/03/2018   Age-related nuclear cataract of right eye 07/03/2018   CAD (coronary artery disease), native coronary artery 04/27/2017   Abnormal nuclear stress test 04/04/2017   Shortness of breath 03/09/2017   Appendicitis    Age-related hypermature cataract of both eyes 12/20/2016   Uterine leiomyoma 11/27/2012   Dyslipidemia (high LDL; low HDL) 11/25/2011   Obesity (BMI 30-39.9) 11/25/2011   Hypertensive disorder 11/21/2011   Type 2 diabetes mellitus ([place]) 11/21/2011   Abnormal EKG 11/21/2011   Past Medical History:  Diagnosis Date   Acute GI bleeding    Allergy    Anemia    Anterior chest wall pain    Appendicitis 1965   Asthma    Body mass index 37.0-37.9, adult    Breast pain    Cataract    both eyes   CHF (congestive heart failure) ([place])    Chronic kidney disease    stage 5 - on dialysis   Cognitive change  04/20/2021   r/t cva 03/6439   Complication of anesthesia    Memory loss after general   Dehydration 2014   Deviated septum 1971   Diabetes mellitus    Dysphagia due to old stroke    easy to get strangled when eating   Dyspnea 2014   Extrinsic asthma    WITH ASTHMA ATTACK   Fibroid 1980   GERD (gastroesophageal reflux disease)    Heart murmur    History of migraine    History of seizure    with stroke   Hx  gestational diabetes    Hyperlipidemia    Hypertension 2014   Inguinal hernia 1959   Malaise and fatigue 2014   Non-IgE mediated allergic asthma 2014   Obesity    Pelvic pain    Pregnancy, high-risk 1985   Stroke ([place]) 04/20/2021   (CVA) of right basal ganglia   Tonsillitis 1968   Uterine fibroid 1980   Visual field defect    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    Family History  Problem Relation Age of Onset   Cancer Mother        abdominal melamona   Psoriasis Mother    Alzheimer's disease Father    Cancer Cousin        colon    Diabetes Cousin    Diabetes Maternal Aunt    Colon cancer Neg Hx    Colon polyps Neg Hx    Esophageal cancer Neg Hx    Rectal cancer Neg Hx    Stomach cancer Neg Hx    Breast cancer Neg Hx     Past Surgical History:  Procedure Laterality Date   ABDOMINAL AORTOGRAM W/LOWER EXTREMITY N/A 02/21/2022   Procedure: ABDOMINAL AORTOGRAM W/LOWER EXTREMITY;  Surgeon: Waynetta Sandy, MD;  Location: [place] [facility];  Service: Cardiovascular;  Laterality: N/A;   AMPUTATION Right 05/18/2022   Procedure: RIGHT BELOW KNEE AMPUTATION;  Surgeon: Newt Minion, MD;  Location: [place];  Service: Orthopedics;  Laterality: Right;   AMPUTATION TOE Right 04/15/2022   Procedure: AMPUTATION  LST TOERIGHT FOOT;  Surgeon: Criselda Peaches, DPM;  [facility];  Service: Podiatry;  Laterality: Right;   APPENDECTOMY  0258   BASCILIC VEIN TRANSPOSITION Right 04/30/2021   Procedure: RIGHT FIRST STAGE [place];  Surgeon: Angelia Mould,  MD;  Location: [place];  Service: Vascular;  Laterality: Right;   CESAREAN SECTION  1985   COLONOSCOPY     ESOPHAGOGASTRODUODENOSCOPY (EGD) WITH PROPOFOL N/A 04/18/2021   Procedure: ESOPHAGOGASTRODUODENOSCOPY (EGD) WITH PROPOFOL;  Surgeon: Gatha Mayer, MD;  Location: [place];  Service: Endoscopy;  Laterality: N/A;   EYE SURGERY     bilateral cataract    FLEXIBLE SIGMOIDOSCOPY N/A 04/18/2021   Procedure: FLEXIBLE SIGMOIDOSCOPY;  Surgeon: Gatha Mayer, MD;  Location: [facility];  Service: Endoscopy;  Laterality: N/A;   HERNIA REPAIR  1959   IR FLUORO GUIDE CV LINE RIGHT  03/18/2021   IR US GUIDE VASC ACCESS RIGHT  03/18/2021   LEFT HEART CATH AND CORONARY ANGIOGRAPHY N/A 04/04/2017   Procedure: Left Heart Cath and Coronary Angiography;  Surgeon: Belva Crome, MD;  Location: [place] [facility];  Service: Cardiovascular;  Laterality: N/A;   [place], 2004, 2007   PERIPHERAL VASCULAR THROMBECTOMY  02/21/2022   Procedure: PERIPHERAL VASCULAR THROMBECTOMY;  Surgeon: Waynetta Sandy, MD;  Location: [place] [facility];  Service: Cardiovascular;;  Right Tibial   RHINOPLASTY  1971   ROTATOR CUFF REPAIR  2003   SURGICAL REPAIR OF HEMORRHAGE  2015   TONSILLECTOMY  1968   Social History   Occupational History   Occupation: retired  Tobacco Use   Smoking status: Never   Smokeless tobacco: Never  Vaping Use   Vaping Use: Never used  Substance and Sexual Activity   Alcohol use: No   Drug use: No   Sexual activity: Not Currently    Birth control/protection: Post-menopausal

## 2022-11-04 ENCOUNTER — Telehealth: Payer: Self-pay | Admitting: Audiologist

## 2022-11-04 DIAGNOSIS — Z992 Dependence on renal dialysis: Secondary | ICD-10-CM | POA: Diagnosis not present

## 2022-11-04 DIAGNOSIS — I639 Cerebral infarction, unspecified: Secondary | ICD-10-CM | POA: Diagnosis not present

## 2022-11-04 DIAGNOSIS — N2581 Secondary hyperparathyroidism of renal origin: Secondary | ICD-10-CM | POA: Diagnosis not present

## 2022-11-04 DIAGNOSIS — N186 End stage renal disease: Secondary | ICD-10-CM | POA: Diagnosis not present

## 2022-11-04 NOTE — Telephone Encounter (Signed)
Dr. [name] called on behalf of his wife. They misplaced the information Judson Roch gave regarding where to get some equipment. He is requesting a call back from Judson Roch or a message via [place].

## 2022-11-07 DIAGNOSIS — N186 End stage renal disease: Secondary | ICD-10-CM | POA: Diagnosis not present

## 2022-11-07 DIAGNOSIS — Z992 Dependence on renal dialysis: Secondary | ICD-10-CM | POA: Diagnosis not present

## 2022-11-07 DIAGNOSIS — N2581 Secondary hyperparathyroidism of renal origin: Secondary | ICD-10-CM | POA: Diagnosis not present

## 2022-11-09 DIAGNOSIS — N2581 Secondary hyperparathyroidism of renal origin: Secondary | ICD-10-CM | POA: Diagnosis not present

## 2022-11-09 DIAGNOSIS — Z992 Dependence on renal dialysis: Secondary | ICD-10-CM | POA: Diagnosis not present

## 2022-11-09 DIAGNOSIS — N186 End stage renal disease: Secondary | ICD-10-CM | POA: Diagnosis not present

## 2022-11-10 DIAGNOSIS — I2510 Atherosclerotic heart disease of native coronary artery without angina pectoris: Secondary | ICD-10-CM | POA: Diagnosis not present

## 2022-11-10 DIAGNOSIS — N186 End stage renal disease: Secondary | ICD-10-CM | POA: Diagnosis not present

## 2022-11-10 DIAGNOSIS — S88111S Complete traumatic amputation at level between knee and ankle, right lower leg, sequela: Secondary | ICD-10-CM | POA: Diagnosis not present

## 2022-11-10 DIAGNOSIS — I5032 Chronic diastolic (congestive) heart failure: Secondary | ICD-10-CM | POA: Diagnosis not present

## 2022-11-10 DIAGNOSIS — M6281 Muscle weakness (generalized): Secondary | ICD-10-CM | POA: Diagnosis not present

## 2022-11-11 DIAGNOSIS — N2581 Secondary hyperparathyroidism of renal origin: Secondary | ICD-10-CM | POA: Diagnosis not present

## 2022-11-11 DIAGNOSIS — Z992 Dependence on renal dialysis: Secondary | ICD-10-CM | POA: Diagnosis not present

## 2022-11-11 DIAGNOSIS — N186 End stage renal disease: Secondary | ICD-10-CM | POA: Diagnosis not present

## 2022-11-13 DIAGNOSIS — N186 End stage renal disease: Secondary | ICD-10-CM | POA: Diagnosis not present

## 2022-11-13 DIAGNOSIS — J45909 Unspecified asthma, uncomplicated: Secondary | ICD-10-CM | POA: Diagnosis not present

## 2022-11-13 DIAGNOSIS — I2510 Atherosclerotic heart disease of native coronary artery without angina pectoris: Secondary | ICD-10-CM | POA: Diagnosis not present

## 2022-11-13 DIAGNOSIS — J9601 Acute respiratory failure with hypoxia: Secondary | ICD-10-CM | POA: Diagnosis not present

## 2022-11-13 DIAGNOSIS — I69354 Hemiplegia and hemiparesis following cerebral infarction affecting left non-dominant side: Secondary | ICD-10-CM | POA: Diagnosis not present

## 2022-11-13 DIAGNOSIS — S88111D Complete traumatic amputation at level between knee and ankle, right lower leg, subsequent encounter: Secondary | ICD-10-CM | POA: Diagnosis not present

## 2022-11-13 DIAGNOSIS — I130 Hypertensive heart and chronic kidney disease with heart failure and stage 1 through stage 4 chronic kidney disease, or unspecified chronic kidney disease: Secondary | ICD-10-CM | POA: Diagnosis not present

## 2022-11-13 DIAGNOSIS — I5032 Chronic diastolic (congestive) heart failure: Secondary | ICD-10-CM | POA: Diagnosis not present

## 2022-11-13 DIAGNOSIS — I69391 Dysphagia following cerebral infarction: Secondary | ICD-10-CM | POA: Diagnosis not present

## 2022-11-14 ENCOUNTER — Telehealth: Payer: Self-pay

## 2022-11-14 DIAGNOSIS — Z992 Dependence on renal dialysis: Secondary | ICD-10-CM | POA: Diagnosis not present

## 2022-11-14 DIAGNOSIS — N186 End stage renal disease: Secondary | ICD-10-CM | POA: Diagnosis not present

## 2022-11-14 DIAGNOSIS — N2581 Secondary hyperparathyroidism of renal origin: Secondary | ICD-10-CM | POA: Diagnosis not present

## 2022-11-14 NOTE — Telephone Encounter (Signed)
Patient wanted to know if she could come by and get an order for prosthetic?  Stated that they will be on the way to pick up.  Thank you.

## 2022-11-14 NOTE — Telephone Encounter (Signed)
[facility] script was wrote and handed to front desk staff.

## 2022-11-16 DIAGNOSIS — N186 End stage renal disease: Secondary | ICD-10-CM | POA: Diagnosis not present

## 2022-11-16 DIAGNOSIS — N2581 Secondary hyperparathyroidism of renal origin: Secondary | ICD-10-CM | POA: Diagnosis not present

## 2022-11-16 DIAGNOSIS — Z992 Dependence on renal dialysis: Secondary | ICD-10-CM | POA: Diagnosis not present

## 2022-11-18 DIAGNOSIS — Z992 Dependence on renal dialysis: Secondary | ICD-10-CM | POA: Diagnosis not present

## 2022-11-18 DIAGNOSIS — N2581 Secondary hyperparathyroidism of renal origin: Secondary | ICD-10-CM | POA: Diagnosis not present

## 2022-11-18 DIAGNOSIS — N186 End stage renal disease: Secondary | ICD-10-CM | POA: Diagnosis not present

## 2022-11-21 DIAGNOSIS — N186 End stage renal disease: Secondary | ICD-10-CM | POA: Diagnosis not present

## 2022-11-21 DIAGNOSIS — M6281 Muscle weakness (generalized): Secondary | ICD-10-CM | POA: Diagnosis not present

## 2022-11-21 DIAGNOSIS — Z992 Dependence on renal dialysis: Secondary | ICD-10-CM | POA: Diagnosis not present

## 2022-11-21 DIAGNOSIS — I2510 Atherosclerotic heart disease of native coronary artery without angina pectoris: Secondary | ICD-10-CM | POA: Diagnosis not present

## 2022-11-21 DIAGNOSIS — S88111S Complete traumatic amputation at level between knee and ankle, right lower leg, sequela: Secondary | ICD-10-CM | POA: Diagnosis not present

## 2022-11-21 DIAGNOSIS — I5032 Chronic diastolic (congestive) heart failure: Secondary | ICD-10-CM | POA: Diagnosis not present

## 2022-11-21 DIAGNOSIS — N2581 Secondary hyperparathyroidism of renal origin: Secondary | ICD-10-CM | POA: Diagnosis not present

## 2022-11-23 ENCOUNTER — Ambulatory Visit: Payer: Medicare PPO | Admitting: Physician Assistant

## 2022-11-23 ENCOUNTER — Ambulatory Visit (HOSPITAL_COMMUNITY)
Admission: RE | Admit: 2022-11-23 | Discharge: 2022-11-23 | Disposition: A | Discharge status: Home / Self Care | Payer: Medicare PPO | Source: Ambulatory Visit | Attending: Vascular Surgery | Admitting: Vascular Surgery

## 2022-11-23 ENCOUNTER — Encounter: Payer: Self-pay | Admitting: Physician Assistant

## 2022-11-23 VITALS — BP 162/76 | HR 72 | Temp 97.9°F

## 2022-11-23 DIAGNOSIS — N186 End stage renal disease: Secondary | ICD-10-CM | POA: Diagnosis not present

## 2022-11-23 DIAGNOSIS — E1122 Type 2 diabetes mellitus with diabetic chronic kidney disease: Secondary | ICD-10-CM | POA: Diagnosis not present

## 2022-11-23 DIAGNOSIS — I739 Peripheral vascular disease, unspecified: Secondary | ICD-10-CM | POA: Insufficient documentation

## 2022-11-23 DIAGNOSIS — N2581 Secondary hyperparathyroidism of renal origin: Secondary | ICD-10-CM | POA: Diagnosis not present

## 2022-11-23 DIAGNOSIS — Z992 Dependence on renal dialysis: Secondary | ICD-10-CM

## 2022-11-23 LAB — VAS US ABI WITH/WO TBI

## 2022-11-23 NOTE — Progress Notes (Signed)
HISTORY AND PHYSICAL     CC:  follow up. Requesting Provider:  No ref. provider found  HPI: This is a 72 y.o. female who is here today for follow up for PAD.  Pt has hx of  ESRD via [place].  She has a 1st stage right BVT that has not been transposed.   Pt was seen for ulcerations on the right foot and subsequently underwent right BKA by Dr. Sharol Given.  She was seen by me in October 2023 at which time she had a small ulceration on the left lateral heel.  She also had some discoloration of the left 3rd toe and she said it had been that way since she was a kid riding bikes.  She was not having any pain in the left foot at night.    Pt was last seen November 2023 by Dr. [name] and at that time, her left heel ulceration had healed but she still had some discoloration of the left 3rd toe.  She was offered angiogram but wanted to defer until the amputation site had healed.  She was instructed to f/u with ABI in 3 months.   The pt returns today for follow up and here with her husband.  She denies any pain in her left foot.  She says that the ulceration on her foot has healed. She states she has some swelling in the foot and feels better with it elevated and the swelling improves.    She is continuing to use her HD catheter.  She is not willing to proceed with 2nd stage BVT.  Her husband inquires about PD catheter.   She has hx of stroke that affected the left side.    She dialyzes M/W/F at the [facility] location.    The pt is on a statin for cholesterol management.    The pt is on an aspirin.    Other AC:  Brilinta The pt is on BB for hypertension.  The pt does  have diabetes. Tobacco hx:  never  Past Medical History:  Diagnosis Date   Acute GI bleeding    Allergy    Anemia    Anterior chest wall pain    Appendicitis 1965   Asthma    Body mass index 37.0-37.9, adult    Breast pain    Cataract    both eyes   CHF (congestive heart failure) ([place])    Chronic kidney disease    stage 5 - on  dialysis   Cognitive change 04/20/2021   r/t cva 11/5051   Complication of anesthesia    Memory loss after general   Dehydration 2014   Deviated septum 1971   Diabetes mellitus    Dysphagia due to old stroke    easy to get strangled when eating   Dyspnea 2014   Extrinsic asthma    WITH ASTHMA ATTACK   Fibroid 1980   GERD (gastroesophageal reflux disease)    Heart murmur    History of migraine    History of seizure    with stroke   Hx gestational diabetes    Hyperlipidemia    Hypertension 2014   Inguinal hernia 1959   Malaise and fatigue 2014   Non-IgE mediated allergic asthma 2014   Obesity    Pelvic pain    Pregnancy, high-risk 1985   Stroke ([place]) 04/20/2021   (CVA) of right basal ganglia   Tonsillitis 1968   Uterine fibroid 1980   Visual field defect  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    Past Surgical History:  Procedure Laterality Date   ABDOMINAL AORTOGRAM W/LOWER EXTREMITY N/A 02/21/2022   Procedure: ABDOMINAL AORTOGRAM W/LOWER EXTREMITY;  Surgeon: Waynetta Sandy, MD;  Location: [facility];  Service: Cardiovascular;  Laterality: N/A;   AMPUTATION Right 05/18/2022   Procedure: RIGHT BELOW KNEE AMPUTATION;  Surgeon: Newt Minion, MD;  Location: [facility];  Service: Orthopedics;  Laterality: Right;   AMPUTATION TOE Right 04/15/2022   Procedure: AMPUTATION  LST TOERIGHT FOOT;  Surgeon: Criselda Peaches, DPM;  Location: [facility];  Service: Podiatry;  Laterality: Right;   APPENDECTOMY  7846   BASCILIC VEIN TRANSPOSITION Right 04/30/2021   Procedure: RIGHT FIRST STAGE [place];  Surgeon: Angelia Mould, MD;  Location: [facility];  Service: Vascular;  Laterality: Right;   CESAREAN SECTION  1985   COLONOSCOPY     ESOPHAGOGASTRODUODENOSCOPY (EGD) WITH PROPOFOL N/A 04/18/2021   Procedure: ESOPHAGOGASTRODUODENOSCOPY (EGD) WITH PROPOFOL;  Surgeon: Gatha Mayer, MD;  Location: [facility];  Service: Endoscopy;  Laterality: N/A;   EYE SURGERY      bilateral cataract    FLEXIBLE SIGMOIDOSCOPY N/A 04/18/2021   Procedure: FLEXIBLE SIGMOIDOSCOPY;  Surgeon: Gatha Mayer, MD;  Location[facility];  Service: Endoscopy;  Laterality: N/A;   HERNIA REPAIR  1959   IR FLUORO GUIDE CV LINE RIGHT  03/18/2021   IR US GUIDE VASC ACCESS RIGHT  03/18/2021   LEFT HEART CATH AND CORONARY ANGIOGRAPHY N/A 04/04/2017   Procedure: Left Heart Cath and Coronary Angiography;  Surgeon: Belva Crome, MD;  Location: [facility];  Service: Cardiovascular;  Laterality: N/A;   [place], 2004, 2007   PERIPHERAL VASCULAR THROMBECTOMY  02/21/2022   Procedure: PERIPHERAL VASCULAR THROMBECTOMY;  Surgeon: Waynetta Sandy, MD;  Location: [facility];  Service: Cardiovascular;;  Right Tibial   RHINOPLASTY  1971   ROTATOR CUFF REPAIR  2003   SURGICAL REPAIR OF HEMORRHAGE  2015   TONSILLECTOMY  1968    Allergies  Allergen Reactions   Food Anaphylaxis    Peanuts - anaphylaxis Almonds - anaphylaxis Not listed on MAR   Wheat Bran Other (See Comments)    Upset stomach  Not listed on MAR   Statins Itching and Other (See Comments)    Generalized aches- tolerates crestor  Not listed on MAR   Gadolinium Derivatives Other (See Comments)    Gadolinium-Containing Contrast Media Listed as an allergy on MAR Unknown reaction   Januvia [Sitagliptin] Other (See Comments)    Listed as an allergy on MAR Unknown reaction   Pork-Derived Products Other (See Comments)    Does not eat pork  Listed on MAR   Shellfish Allergy Other (See Comments)    Mouth gets raw Listed on MAR   Tetracycline Other (See Comments)    Raw mouth Not listed on MAR    Current Outpatient Medications  Medication Sig Dispense Refill   acetaminophen (TYLENOL) 500 MG tablet Take 1,000 mg by mouth 2 (two) times daily as needed (leg and arm pain).     Amino Acids-Protein Hydrolys (PRO-STAT) LIQD Take 30 mLs by mouth 2 (two) times daily.     aspirin EC 81 MG tablet Take 1  tablet (81 mg total) by mouth daily. Swallow whole. 30 tablet 12   carvedilol (COREG) 3.125 MG tablet Take 1 tablet (3.125 mg total) by mouth 2 (two) times daily with a meal. 180 tablet 1  cephALEXin (KEFLEX) 500 MG capsule Take 500 mg by mouth in the morning and at bedtime.     collagenase (SANTYL) 250 UNIT/GM ointment Apply 1 Application topically daily. 30 g 0   collagenase (SANTYL) 250 UNIT/GM ointment Apply 1 Application topically daily. Apply to the affected area daily plus dry dressing 90 g 3   diclofenac Sodium (VOLTAREN) 1 % GEL Apply 2 g topically 4 (four) times daily. To left wrist 150 g 0   Glucagon, rDNA, (GLUCAGON EMERGENCY) 1 MG KIT Inject 1 mg into the vein as needed (CBG of '65mg'$ /dL).     insulin aspart (NOVOLOG) 100 UNIT/ML injection Inject 0-6 Units into the skin 3 (three) times daily with meals. CBG < 70: Implement Hypoglycemia Standing Orders and refer to Hypoglycemia Standing Orders sidebar report CBG 70 - 120: 0 units CBG 121 - 150: 0 units CBG 151 - 200: 1 unit CBG 201-250: 2 units CBG 251-300: 3 units CBG 301-350: 4 units CBG 351-400: 5 units CBG > 400: Give 6 units and call MD     insulin glargine (LANTUS) 100 UNIT/ML Solostar Pen Inject 5 Units into the skin at bedtime. 15 mL    levETIRAcetam (KEPPRA) 250 MG tablet Take 1.5 tablets (375 mg total) by mouth every Monday, Wednesday, and Friday at 6 PM.     levETIRAcetam (KEPPRA) 750 MG tablet Take 1 tablet (750 mg total) by mouth at bedtime.     methylphenidate (RITALIN) 5 MG tablet Take 1 tablet (5 mg total) by mouth 2 (two) times daily with breakfast and lunch. Due to stroke attention (Patient taking differently: Take 5 mg by mouth 2 (two) times daily with breakfast and lunch.) 180 tablet 0   midodrine (PROAMATINE) 10 MG tablet Take 1 tablet (10 mg total) by mouth every Monday, Wednesday, and Friday with hemodialysis.     mometasone-formoterol (DULERA) 200-5 MCG/ACT AERO Inhale 2 puffs into the lungs 2 (two) times  daily. 3 each 2   ondansetron (ZOFRAN-ODT) 4 MG disintegrating tablet Take 1 tablet (4 mg total) by mouth every 8 (eight) hours as needed for nausea or vomiting. 20 tablet 1   pantoprazole (PROTONIX) 40 MG tablet Take 1 tablet (40 mg total) by mouth daily. (Patient taking differently: Take 40 mg by mouth at bedtime.) 90 tablet 2   polyethylene glycol (MIRALAX / GLYCOLAX) 17 g packet Take 17 g by mouth daily as needed for moderate constipation.  0   rosuvastatin (CRESTOR) 20 MG tablet Take 1 tablet (20 mg total) by mouth daily. (Patient taking differently: Take 20 mg by mouth at bedtime.) 90 tablet 2   senna-docusate (SENOKOT-S) 8.6-50 MG tablet Take 2 tablets by mouth 2 (two) times daily. 360 tablet 2   sevelamer carbonate (RENVELA) 0.8 g PACK packet Take 0.8 g by mouth 3 (three) times daily.     silver sulfADIAZINE (SILVADENE) 1 % cream Apply 1 Application topically daily. Apply to affected area daily plus dry dressing 400 g 3   thiamine (VITAMIN B-1) 100 MG tablet Take 1 tablet (100 mg total) by mouth daily. 30 tablet 1   ticagrelor (BRILINTA) 90 MG TABS tablet Take 1 tablet (90 mg total) by mouth 2 (two) times daily. 60 tablet 3   No current facility-administered medications for this visit.    Family History  Problem Relation Age of Onset   Cancer Mother        abdominal melamona   Psoriasis Mother    Alzheimer's disease Father    Cancer Cousin  colon    Diabetes Cousin    Diabetes Maternal Aunt    Colon cancer Neg Hx    Colon polyps Neg Hx    Esophageal cancer Neg Hx    Rectal cancer Neg Hx    Stomach cancer Neg Hx    Breast cancer Neg Hx     Social History   Socioeconomic History   Marital status: Married    Spouse name: [name]   Number of children: 1   Years of education: Not on file   Highest education level: Professional school degree (e.g., MD, DDS, DVM, JD)  Occupational History   Occupation: retired  Tobacco Use   Smoking status: Never   Smokeless tobacco:  Never  Vaping Use   Vaping Use: Never used  Substance and Sexual Activity   Alcohol use: No   Drug use: No   Sexual activity: Not Currently    Birth control/protection: Post-menopausal  Other Topics Concern   Not on file  Social History Narrative   06/21/21 lives with husband Dr Marylynn Pearson, caregiver- Zigmund Daniel   Patient reports she used to walk at [employer] but now due to Covid-19 she is staying inside.   Social Determinants of Health   Financial Resource Strain: Low Risk  (09/02/2021)   Overall Financial Resource Strain (CARDIA)    Difficulty of Paying Living Expenses: Not hard at all  Food Insecurity: No Food Insecurity (09/02/2021)   Hunger Vital Sign    Worried About Running Out of Food in the Last Year: Never true    Ran Out of Food in the Last Year: Never true  Transportation Needs: No Transportation Needs (09/02/2021)   PRAPARE - Hydrologist (Medical): No    Lack of Transportation (Non-Medical): No  Physical Activity: Inactive (09/02/2021)   Exercise Vital Sign    Days of Exercise per Week: 0 days    Minutes of Exercise per Session: 0 min  Stress: Stress Concern Present (09/02/2021)   [place]    Feeling of Stress : To some extent  Social Connections: Socially Integrated (01/09/2019)   Social Connection and Isolation Panel [NHANES]    Frequency of Communication with Friends and Family: More than three times a week    Frequency of Social Gatherings with Friends and Family: More than three times a week    Attends Religious Services: More than 4 times per year    Active Member of [employer] or Organizations: Yes    Attends Music therapist: More than 4 times per year    Marital Status: Married  Human resources officer Violence: Not At Risk (03/27/2019)   Humiliation, Afraid, Rape, and Kick questionnaire    Fear of Current or Ex-Partner: No    Emotionally Abused: No    Physically  Abused: No    Sexually Abused: No     REVIEW OF SYSTEMS:   '[X]'$  denotes positive finding, '[ ]'$  denotes negative finding Cardiac  Comments:  Chest pain or chest pressure:    Shortness of breath upon exertion:    Short of breath when lying flat:    Irregular heart rhythm:        Vascular    Pain in calf, thigh, or hip brought on by ambulation:    Pain in feet at night that wakes you up from your sleep:     Blood clot in your veins:    Leg swelling:  Pulmonary    Oxygen at home:    Productive cough:     Wheezing:         Neurologic    Sudden weakness in arms or legs:     Sudden numbness in arms or legs:     Sudden onset of difficulty speaking or slurred speech:    Temporary loss of vision in one eye:     Problems with dizziness:         Gastrointestinal    Blood in stool:     Vomited blood:         Genitourinary    Burning when urinating:     Blood in urine:        Psychiatric    Major depression:         Hematologic    Bleeding problems:    Problems with blood clotting too easily:        Skin    Rashes or ulcers:        Constitutional    Fever or chills:      PHYSICAL EXAMINATION:  Today's Vitals   11/23/22 1438  BP: (!) 162/76  Pulse: 72  Temp: 97.9 F (36.6 C)  TempSrc: Temporal  SpO2: 98%   There is no height or weight on file to calculate BMI.   General:  WDWN in NAD; vital signs documented above Gait: Not observed HENT: WNL, normocephalic Pulmonary: normal non-labored breathing , without wheezing Cardiac: regular HR Abdomen: obese Skin: without rashes Vascular Exam/Pulses: Brisk monophasic doppler flow left DP/peroneal Extremities: + thrill in right arm fistula; left 3rd toe lighter than back in October. Mild swelling in the foot.  Musculoskeletal: no muscle wasting or atrophy  Neurologic: A&O X 3 Psychiatric:  The pt has Normal affect.   Non-Invasive Vascular Imaging:   ABI's/TBI's on 11/23/2022: Right:  amputation  Left:   [place]/0.51 - Great toe pressure: 81  Previous ABI's/TBI's on 07/27/2022: Right:  amputation Left:  0.90/0 - Great toe pressure:  0     ASSESSMENT/PLAN:: 72 y.o. female here for follow up for PAD with hx of ESRD on HD with decreased TBI and toe pressures.    -pt ABI today improved from previous.  She has brisk monophasic flow left DP/peroneal.  Left 3rd toe remains darkened but appears improved from previous picture.  -continue asa/statin/brilinta -pt will f/u in 6 months with ABI. They know to call sooner if there are any issues before next visit.  -pt still unwilling to proceed with 2nd stage BVT.  Discussed with pt and her husband that if [facility] gets infected, she could die from that and that is why they want to get 2nd stage up and going but despite this, she is unwilling to proceed.  Husband inquires about PD catheter.  We discussed this would still be a surgery in the OR and given she has hx of appendectomy and C-section, it is possible she would not be a candidate for this.    Leontine Locket, [facility] [phone]  Clinic MD:   [name]

## 2022-11-24 DIAGNOSIS — I10 Essential (primary) hypertension: Secondary | ICD-10-CM | POA: Diagnosis not present

## 2022-11-24 DIAGNOSIS — J45909 Unspecified asthma, uncomplicated: Secondary | ICD-10-CM | POA: Diagnosis not present

## 2022-11-24 DIAGNOSIS — M6281 Muscle weakness (generalized): Secondary | ICD-10-CM | POA: Diagnosis not present

## 2022-11-24 DIAGNOSIS — K219 Gastro-esophageal reflux disease without esophagitis: Secondary | ICD-10-CM | POA: Diagnosis not present

## 2022-11-24 DIAGNOSIS — Z794 Long term (current) use of insulin: Secondary | ICD-10-CM | POA: Diagnosis not present

## 2022-11-24 DIAGNOSIS — G40909 Epilepsy, unspecified, not intractable, without status epilepticus: Secondary | ICD-10-CM | POA: Diagnosis not present

## 2022-11-24 DIAGNOSIS — R112 Nausea with vomiting, unspecified: Secondary | ICD-10-CM | POA: Diagnosis not present

## 2022-11-24 DIAGNOSIS — N186 End stage renal disease: Secondary | ICD-10-CM | POA: Diagnosis not present

## 2022-11-24 DIAGNOSIS — I5032 Chronic diastolic (congestive) heart failure: Secondary | ICD-10-CM | POA: Diagnosis not present

## 2022-11-25 DIAGNOSIS — Z992 Dependence on renal dialysis: Secondary | ICD-10-CM | POA: Diagnosis not present

## 2022-11-25 DIAGNOSIS — N2581 Secondary hyperparathyroidism of renal origin: Secondary | ICD-10-CM | POA: Diagnosis not present

## 2022-11-25 DIAGNOSIS — N186 End stage renal disease: Secondary | ICD-10-CM | POA: Diagnosis not present

## 2022-11-28 DIAGNOSIS — N2581 Secondary hyperparathyroidism of renal origin: Secondary | ICD-10-CM | POA: Diagnosis not present

## 2022-11-28 DIAGNOSIS — N186 End stage renal disease: Secondary | ICD-10-CM | POA: Diagnosis not present

## 2022-11-28 DIAGNOSIS — Z992 Dependence on renal dialysis: Secondary | ICD-10-CM | POA: Diagnosis not present

## 2022-11-30 DIAGNOSIS — N2581 Secondary hyperparathyroidism of renal origin: Secondary | ICD-10-CM | POA: Diagnosis not present

## 2022-11-30 DIAGNOSIS — N186 End stage renal disease: Secondary | ICD-10-CM | POA: Diagnosis not present

## 2022-11-30 DIAGNOSIS — Z992 Dependence on renal dialysis: Secondary | ICD-10-CM | POA: Diagnosis not present

## 2022-12-02 DIAGNOSIS — Z992 Dependence on renal dialysis: Secondary | ICD-10-CM | POA: Diagnosis not present

## 2022-12-02 DIAGNOSIS — N2581 Secondary hyperparathyroidism of renal origin: Secondary | ICD-10-CM | POA: Diagnosis not present

## 2022-12-02 DIAGNOSIS — N186 End stage renal disease: Secondary | ICD-10-CM | POA: Diagnosis not present

## 2022-12-03 DIAGNOSIS — E1165 Type 2 diabetes mellitus with hyperglycemia: Secondary | ICD-10-CM | POA: Diagnosis not present

## 2022-12-05 DIAGNOSIS — N2581 Secondary hyperparathyroidism of renal origin: Secondary | ICD-10-CM | POA: Diagnosis not present

## 2022-12-05 DIAGNOSIS — Z992 Dependence on renal dialysis: Secondary | ICD-10-CM | POA: Diagnosis not present

## 2022-12-05 DIAGNOSIS — N186 End stage renal disease: Secondary | ICD-10-CM | POA: Diagnosis not present

## 2022-12-05 DIAGNOSIS — I639 Cerebral infarction, unspecified: Secondary | ICD-10-CM | POA: Diagnosis not present

## 2022-12-06 ENCOUNTER — Telehealth: Payer: Self-pay

## 2022-12-06 ENCOUNTER — Other Ambulatory Visit: Payer: Self-pay

## 2022-12-06 DIAGNOSIS — S88111A Complete traumatic amputation at level between knee and ankle, right lower leg, initial encounter: Secondary | ICD-10-CM

## 2022-12-06 NOTE — Telephone Encounter (Signed)
[facility] called to advise that pt will need out patient PT. She has completed her home care visits. Advised that I will enter an order for PT we have this available at our office and someone will call to set this up.

## 2022-12-07 DIAGNOSIS — N186 End stage renal disease: Secondary | ICD-10-CM | POA: Diagnosis not present

## 2022-12-07 DIAGNOSIS — N2581 Secondary hyperparathyroidism of renal origin: Secondary | ICD-10-CM | POA: Diagnosis not present

## 2022-12-07 DIAGNOSIS — Z992 Dependence on renal dialysis: Secondary | ICD-10-CM | POA: Diagnosis not present

## 2022-12-09 ENCOUNTER — Encounter: Payer: Self-pay | Admitting: Physical Medicine and Rehabilitation

## 2022-12-09 ENCOUNTER — Encounter: Payer: Medicare PPO | Attending: Physical Medicine and Rehabilitation | Admitting: Physical Medicine and Rehabilitation

## 2022-12-09 VITALS — BP 172/71 | HR 75

## 2022-12-09 DIAGNOSIS — S88111A Complete traumatic amputation at level between knee and ankle, right lower leg, initial encounter: Secondary | ICD-10-CM | POA: Insufficient documentation

## 2022-12-09 DIAGNOSIS — I6381 Other cerebral infarction due to occlusion or stenosis of small artery: Secondary | ICD-10-CM | POA: Insufficient documentation

## 2022-12-09 DIAGNOSIS — N2581 Secondary hyperparathyroidism of renal origin: Secondary | ICD-10-CM | POA: Diagnosis not present

## 2022-12-09 DIAGNOSIS — R29898 Other symptoms and signs involving the musculoskeletal system: Secondary | ICD-10-CM | POA: Insufficient documentation

## 2022-12-09 DIAGNOSIS — Z992 Dependence on renal dialysis: Secondary | ICD-10-CM | POA: Diagnosis not present

## 2022-12-09 DIAGNOSIS — N186 End stage renal disease: Secondary | ICD-10-CM | POA: Diagnosis not present

## 2022-12-09 DIAGNOSIS — G8194 Hemiplegia, unspecified affecting left nondominant side: Secondary | ICD-10-CM | POA: Insufficient documentation

## 2022-12-09 MED ORDER — HYDROXYZINE PAMOATE 25 MG PO CAPS: 25.0000 mg | ORAL_CAPSULE | Freq: Three times a day (TID) | ORAL | 5 refills | Status: DC | PRN

## 2022-12-09 MED ORDER — METHYLPHENIDATE HCL 5 MG PO TABS: 5.0000 mg | ORAL_TABLET | Freq: Two times a day (BID) | ORAL | 0 refills | Status: DC

## 2022-12-09 NOTE — Patient Instructions (Signed)
Pt is a 72 yr old female with recent hx of L hemiparesis due to R basal ganglia infarct in 7/22; R hip bursitis; anxiety; LGIB; dCHF; on HD from ESRD; HTN, BMI of 34 and DM- A1c 5.2- here for f/u on stroke and R hand weakness/numbness.  Also sedation with stroke.     Can try Melatonin- 3-5 mg nightly- might help nightmares to use lower dose.   2. Benadryl more likely to cause nightmares- but itches so much-    3. Will try Vistaril 25 mg as needed up to 3x/day #60- with 5 refills.  Instead of Benadryl .   4.  Needs audiogram- suggest going up to office to get it. Or Medical Records and they get it for her. For hearing aids.   5. Ready for prosthesis on RLE- wear shrinker  6. NOT DEPRESSED! Per pt! But is HOH  7. Send in/restart Ritalin 5 mg- up to 2x/day- #60- no refills   8. F/U in 3 months- on stroke  9. Mild spasticity- meds can make her more sleepy- so wait on meds-  ROM at least 3x/day.

## 2022-12-09 NOTE — Progress Notes (Signed)
Subjective:    Patient ID: Carolyn Russo, female    DOB: [birth year], 72 y.o.   MRN: [id number]  HPI  Pt is a 72 yr old female with recent hx of L hemiparesis due to R basal ganglia infarct in 7/22; R hip bursitis; anxiety; LGIB; dCHF; on HD from ESRD; HTN, BMI of 34 and DM- A1c 5.2- here for f/u on stroke and R hand weakness/numbness.  Also sedation with stroke.      Nausea medicine makes her sicker- said zofran wasn't working Phenergan did end up helping more.  Nausea worse on HD days- not constipated-    Rubbing scab on R BKA- due to itching- hot and rubbing it at night- bleeding has stopped since then-   HD this AM-  Per pt, "Body doesn't like HD".   Ran out of Ritalin and hasn't been refilled.  Husband said less alert off Ritalin- and notices since ran out 3-4 weeks ago.  Had problems sleeping at night on Ritalin 2x/day.   Had nightmares- not sure if ritalin and melatonin- has nightmares-  Takes 5 mg-   Had amputation of R leg- healed finally- took a long time.  Getting fitted for prosthesis- will be 4-5 weeks - did initial molding- and then goes back soon to see how it fits.  Carolyn Carolyn Russo did R BKA, so wrote for prosthesis.   Can use R hand now- after damage to R hand from fistula placmeent Can now sign with R hand.    C/O elbow and knee pain- using voltaren gel.    Has had a second stroke as well. Using L hand a lot less- doing HEP on L hand.   Need hearing aids- per audiologist Son lost paperwork- need audiogram.     Pain Inventory Average Pain 0 Pain Right Now 0 My pain is  n/a  LOCATION OF PAIN  n/a  BOWEL Number of stools per week: 5 Oral laxative use No  Type of laxative n/a Enema or suppository use No  History of colostomy No  Incontinent No   BLADDER Normal In and out cath, frequency n/a Able to self cath  n/a Bladder incontinence Yes  Frequent urination No  Leakage with coughing No  Difficulty starting stream No  Incomplete bladder  emptying No    Mobility use a wheelchair  Function I need assistance with the following:  meal prep  Neuro/Psych bladder control problems bowel control problems  Prior Studies no  Physicians involved in your care Any changes since last visit?  no   Family History  Problem Relation Age of Onset   Cancer Mother        abdominal melamona   Psoriasis Mother    Alzheimer's disease Father    Cancer Cousin        colon    Diabetes Cousin    Diabetes Maternal Aunt    Colon cancer Neg Hx    Colon polyps Neg Hx    Esophageal cancer Neg Hx    Rectal cancer Neg Hx    Stomach cancer Neg Hx    Breast cancer Neg Hx    Social History   Socioeconomic History   Marital status: Married    Spouse name: Carolyn Russo   Number of children: 1   Years of education: Not on file   Highest education level: Professional school degree (e.g., MD, DDS, DVM, JD)  Occupational History   Occupation: retired  Tobacco Use   Smoking status: Never  Smokeless tobacco: Never  Vaping Use   Vaping Use: Never used  Substance and Sexual Activity   Alcohol use: No   Drug use: No   Sexual activity: Not Currently    Birth control/protection: Post-menopausal  Other Topics Concern   Not on file  Social History Narrative   06/21/21 lives with husband Carolyn Russo, caregiver- Carolyn Russo   Patient reports she used to walk at [employer] but now due to Covid-19 she is staying inside.   Social Determinants of Health   Financial Resource Strain: Low Risk  (09/02/2021)   Overall Financial Resource Strain (CARDIA)    Difficulty of Paying Living Expenses: Not hard at all  Food Insecurity: No Food Insecurity (09/02/2021)   Hunger Vital Sign    Worried About Running Out of Food in the Last Year: Never true    Ran Out of Food in the Last Year: Never true  Transportation Needs: No Transportation Needs (09/02/2021)   PRAPARE - Hydrologist (Medical): No    Lack of Transportation (Non-Medical):  No  Physical Activity: Inactive (09/02/2021)   Exercise Vital Sign    Days of Exercise per Week: 0 days    Minutes of Exercise per Session: 0 min  Stress: Stress Concern Present (09/02/2021)   [place]    Feeling of Stress : To some extent  Social Connections: Socially Integrated (01/09/2019)   Social Connection and Isolation Panel [NHANES]    Frequency of Communication with Friends and Family: More than three times a week    Frequency of Social Gatherings with Friends and Family: More than three times a week    Attends Religious Services: More than 4 times per year    Active Member of Clubs or Organizations: Yes    Attends Archivist Meetings: More than 4 times per year    Marital Status: Married   Past Surgical History:  Procedure Laterality Date   ABDOMINAL AORTOGRAM W/LOWER EXTREMITY N/A 02/21/2022   Procedure: ABDOMINAL AORTOGRAM W/LOWER EXTREMITY;  Surgeon: Waynetta Sandy, MD;  Location: [facility];  Service: Cardiovascular;  Laterality: N/A;   AMPUTATION Right 05/18/2022   Procedure: RIGHT BELOW KNEE AMPUTATION;  Surgeon: Newt Minion, MD;  Location: [facility];  Service: Orthopedics;  Laterality: Right;   AMPUTATION TOE Right 04/15/2022   Procedure: AMPUTATION  LST TOERIGHT FOOT;  Surgeon: Criselda Peaches, DPM;  Location: [facility];  Service: Podiatry;  Laterality: Right;   APPENDECTOMY  0000000   BASCILIC VEIN TRANSPOSITION Right 04/30/2021   Procedure: RIGHT FIRST STAGE [place];  Surgeon: Angelia Mould, MD;  Location: [facility];  Service: Vascular;  Laterality: Right;   CESAREAN SECTION  1985   COLONOSCOPY     ESOPHAGOGASTRODUODENOSCOPY (EGD) WITH PROPOFOL N/A 04/18/2021   Procedure: ESOPHAGOGASTRODUODENOSCOPY (EGD) WITH PROPOFOL;  Surgeon: Gatha Mayer, MD;  Location: [facility];  Service: Endoscopy;  Laterality: N/A;   EYE SURGERY     bilateral cataract     FLEXIBLE SIGMOIDOSCOPY N/A 04/18/2021   Procedure: FLEXIBLE SIGMOIDOSCOPY;  Surgeon: Gatha Mayer, MD;  Location[facility];  Service: Endoscopy;  Laterality: N/A;   HERNIA REPAIR  1959   IR FLUORO GUIDE CV LINE RIGHT  03/18/2021   IR US GUIDE VASC ACCESS RIGHT  03/18/2021   LEFT HEART CATH AND CORONARY ANGIOGRAPHY N/A 04/04/2017   Procedure: Left Heart Cath and Coronary Angiography;  Surgeon: Belva Crome, MD;  Location: [facility];  Service: Cardiovascular;  Laterality: N/A;   [place], 2004, 2007   PERIPHERAL VASCULAR THROMBECTOMY  02/21/2022   Procedure: PERIPHERAL VASCULAR THROMBECTOMY;  Surgeon: Waynetta Sandy, MD;  Location: [facility];  Service: Cardiovascular;;  Right Tibial   RHINOPLASTY  1971   ROTATOR CUFF REPAIR  2003   SURGICAL REPAIR OF HEMORRHAGE  2015   TONSILLECTOMY  1968   Past Medical History:  Diagnosis Date   Acute GI bleeding    Allergy    Anemia    Anterior chest wall pain    Appendicitis 1965   Asthma    Body mass index 37.0-37.9, adult    Breast pain    Cataract    both eyes   CHF (congestive heart failure) ([place])    Chronic kidney disease    stage 5 - on dialysis   Cognitive change 04/20/2021   r/t cva [id number]   Complication of anesthesia    Memory loss after general   Dehydration 2014   Deviated septum 1971   Diabetes mellitus    Dysphagia due to old stroke    easy to get strangled when eating   Dyspnea 2014   Extrinsic asthma    WITH ASTHMA ATTACK   Fibroid 1980   GERD (gastroesophageal reflux disease)    Heart murmur    History of migraine    History of seizure    with stroke   Hx gestational diabetes    Hyperlipidemia    Hypertension 2014   Inguinal hernia 1959   Malaise and fatigue 2014   Non-IgE mediated allergic asthma 2014   Obesity    Pelvic pain    Pregnancy, high-risk 1985   Stroke ([place]) 04/20/2021   (CV[facility]) of right basal ganglia   Tonsillitis 1968   Uterine fibroid 1980   Visual field  defect    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   There were no vitals taken for this visit.  Opioid Risk Score:   Fall Risk Score:  `1  Depression screen [facility] 2/9     09/24/2021    1:58 PM 09/02/2021   12:26 PM 06/25/2021   11:24 AM 11/26/2020    3:13 PM 08/12/2020    3:46 PM 06/18/2019    2:03 PM 03/27/2019   12:09 PM  Depression screen PHQ 2/9  Decreased Interest 1 0 1 0 0 0 0  Down, Depressed, Hopeless 1 0 1 0 0 0 0  PHQ - 2 Score 2 0 2 0 0 0 0  Altered sleeping   0    0  Tired, decreased energy   3    0  Change in appetite   0    0  Feeling bad or failure about yourself    0    0  Trouble concentrating   0    0  Moving slowly or fidgety/restless   0    0  Suicidal thoughts   0    0  PHQ-9 Score   5    0  Difficult doing work/chores   Somewhat difficult         Review of Systems  Gastrointestinal:  Positive for nausea.  All other systems reviewed and are negative.      Objective:   Physical Exam  Awake, alert, slightly delayed responses; accompanied by husband, NAD Port in R chest still- using for HD L hand mildly swollen Accompanied by husband; in manual w/c with BKA  pad to prop up R BKA- wearing shrinker Constantly rubbing R knee- automatic-   MS:  LUE- Biceps 4+/5; triceps 4+/5; WE 4+/5; Grip 4+/5 and FA 4/5 RUE- biceps 5/5; triceps 5/5; WE 5/5; Grip 4/5; FA 3+/5 LLE- HF 4/5; KE/KF 4/5; DF and PF 4/5 RLE- HF 5/5; KE/KF 5/5  Neuro: MAS of 1+ in LUE-  Hoffman's LUE LLE- no increased tone-    Skin on R BKA- looks great- has epithelialized in spot where had had scab- still a little depth to it    Assessment & Plan:   Pt is a 72 yr old female with recent hx of L hemiparesis due to R basal ganglia infarct in 7/22; R hip bursitis; anxiety; LGIB; dCHF; on HD from ESRD; HTN, BMI of 34 and DM- A1c 5.2- here for f/u on stroke and R hand weakness/numbness.  Also sedation with stroke.     Can try Melatonin- 3-5 mg nightly- might help nightmares to use lower dose.   2. Benadryl  more likely to cause nightmares- but itches so much-    3. Will try Vistaril 25 mg as needed up to 3x/day #60- with 5 refills.  Instead of Benadryl .   4.  Needs audiogram- suggest going up to office to get it. Or Medical Records and they get it for her. For hearing aids.   5. Ready for prosthesis on RLE- wear shrinker  6. NOT DEPRESSED! Per pt! But is HOH  7. Send in/restart Ritalin 5 mg- up to 2x/day- #60- no refills   8. F/U in 3 months- on stroke  9. Mild spasticity- meds can make her more sleepy- so wait on meds-  ROM at least 3x/day.    I spent a total of  36  minutes on total care today- >50% coordination of care- due to discussion of R BKA, stroke with L hemiparesis- and spasticity- mild

## 2022-12-11 DIAGNOSIS — S88111S Complete traumatic amputation at level between knee and ankle, right lower leg, sequela: Secondary | ICD-10-CM | POA: Diagnosis not present

## 2022-12-11 DIAGNOSIS — I2510 Atherosclerotic heart disease of native coronary artery without angina pectoris: Secondary | ICD-10-CM | POA: Diagnosis not present

## 2022-12-11 DIAGNOSIS — M6281 Muscle weakness (generalized): Secondary | ICD-10-CM | POA: Diagnosis not present

## 2022-12-11 DIAGNOSIS — N186 End stage renal disease: Secondary | ICD-10-CM | POA: Diagnosis not present

## 2022-12-11 DIAGNOSIS — I5032 Chronic diastolic (congestive) heart failure: Secondary | ICD-10-CM | POA: Diagnosis not present

## 2022-12-12 DIAGNOSIS — N186 End stage renal disease: Secondary | ICD-10-CM | POA: Diagnosis not present

## 2022-12-12 DIAGNOSIS — Z992 Dependence on renal dialysis: Secondary | ICD-10-CM | POA: Diagnosis not present

## 2022-12-12 DIAGNOSIS — E1165 Type 2 diabetes mellitus with hyperglycemia: Secondary | ICD-10-CM | POA: Diagnosis not present

## 2022-12-12 DIAGNOSIS — N2581 Secondary hyperparathyroidism of renal origin: Secondary | ICD-10-CM | POA: Diagnosis not present

## 2022-12-14 DIAGNOSIS — I69354 Hemiplegia and hemiparesis following cerebral infarction affecting left non-dominant side: Secondary | ICD-10-CM | POA: Diagnosis not present

## 2022-12-14 DIAGNOSIS — S88111D Complete traumatic amputation at level between knee and ankle, right lower leg, subsequent encounter: Secondary | ICD-10-CM | POA: Diagnosis not present

## 2022-12-14 DIAGNOSIS — J9601 Acute respiratory failure with hypoxia: Secondary | ICD-10-CM | POA: Diagnosis not present

## 2022-12-14 DIAGNOSIS — N186 End stage renal disease: Secondary | ICD-10-CM | POA: Diagnosis not present

## 2022-12-14 DIAGNOSIS — I69391 Dysphagia following cerebral infarction: Secondary | ICD-10-CM | POA: Diagnosis not present

## 2022-12-14 DIAGNOSIS — N2581 Secondary hyperparathyroidism of renal origin: Secondary | ICD-10-CM | POA: Diagnosis not present

## 2022-12-14 DIAGNOSIS — I130 Hypertensive heart and chronic kidney disease with heart failure and stage 1 through stage 4 chronic kidney disease, or unspecified chronic kidney disease: Secondary | ICD-10-CM | POA: Diagnosis not present

## 2022-12-14 DIAGNOSIS — I2510 Atherosclerotic heart disease of native coronary artery without angina pectoris: Secondary | ICD-10-CM | POA: Diagnosis not present

## 2022-12-14 DIAGNOSIS — J45909 Unspecified asthma, uncomplicated: Secondary | ICD-10-CM | POA: Diagnosis not present

## 2022-12-14 DIAGNOSIS — I5032 Chronic diastolic (congestive) heart failure: Secondary | ICD-10-CM | POA: Diagnosis not present

## 2022-12-14 DIAGNOSIS — Z992 Dependence on renal dialysis: Secondary | ICD-10-CM | POA: Diagnosis not present

## 2022-12-17 DIAGNOSIS — N186 End stage renal disease: Secondary | ICD-10-CM | POA: Diagnosis not present

## 2022-12-17 DIAGNOSIS — N2581 Secondary hyperparathyroidism of renal origin: Secondary | ICD-10-CM | POA: Diagnosis not present

## 2022-12-17 DIAGNOSIS — Z992 Dependence on renal dialysis: Secondary | ICD-10-CM | POA: Diagnosis not present

## 2022-12-19 DIAGNOSIS — N186 End stage renal disease: Secondary | ICD-10-CM | POA: Diagnosis not present

## 2022-12-19 DIAGNOSIS — Z992 Dependence on renal dialysis: Secondary | ICD-10-CM | POA: Diagnosis not present

## 2022-12-19 DIAGNOSIS — N2581 Secondary hyperparathyroidism of renal origin: Secondary | ICD-10-CM | POA: Diagnosis not present

## 2022-12-21 DIAGNOSIS — Z992 Dependence on renal dialysis: Secondary | ICD-10-CM | POA: Diagnosis not present

## 2022-12-21 DIAGNOSIS — Z794 Long term (current) use of insulin: Secondary | ICD-10-CM | POA: Diagnosis not present

## 2022-12-21 DIAGNOSIS — G40909 Epilepsy, unspecified, not intractable, without status epilepticus: Secondary | ICD-10-CM | POA: Diagnosis not present

## 2022-12-21 DIAGNOSIS — N186 End stage renal disease: Secondary | ICD-10-CM | POA: Diagnosis not present

## 2022-12-21 DIAGNOSIS — M6281 Muscle weakness (generalized): Secondary | ICD-10-CM | POA: Diagnosis not present

## 2022-12-21 DIAGNOSIS — I10 Essential (primary) hypertension: Secondary | ICD-10-CM | POA: Diagnosis not present

## 2022-12-21 DIAGNOSIS — R112 Nausea with vomiting, unspecified: Secondary | ICD-10-CM | POA: Diagnosis not present

## 2022-12-21 DIAGNOSIS — N2581 Secondary hyperparathyroidism of renal origin: Secondary | ICD-10-CM | POA: Diagnosis not present

## 2022-12-22 DIAGNOSIS — I2510 Atherosclerotic heart disease of native coronary artery without angina pectoris: Secondary | ICD-10-CM | POA: Diagnosis not present

## 2022-12-22 DIAGNOSIS — Z992 Dependence on renal dialysis: Secondary | ICD-10-CM | POA: Diagnosis not present

## 2022-12-22 DIAGNOSIS — I5032 Chronic diastolic (congestive) heart failure: Secondary | ICD-10-CM | POA: Diagnosis not present

## 2022-12-22 DIAGNOSIS — S88111S Complete traumatic amputation at level between knee and ankle, right lower leg, sequela: Secondary | ICD-10-CM | POA: Diagnosis not present

## 2022-12-22 DIAGNOSIS — N186 End stage renal disease: Secondary | ICD-10-CM | POA: Diagnosis not present

## 2022-12-22 DIAGNOSIS — M6281 Muscle weakness (generalized): Secondary | ICD-10-CM | POA: Diagnosis not present

## 2022-12-22 DIAGNOSIS — E1122 Type 2 diabetes mellitus with diabetic chronic kidney disease: Secondary | ICD-10-CM | POA: Diagnosis not present

## 2022-12-23 DIAGNOSIS — Z992 Dependence on renal dialysis: Secondary | ICD-10-CM | POA: Diagnosis not present

## 2022-12-23 DIAGNOSIS — N2581 Secondary hyperparathyroidism of renal origin: Secondary | ICD-10-CM | POA: Diagnosis not present

## 2022-12-23 DIAGNOSIS — N186 End stage renal disease: Secondary | ICD-10-CM | POA: Diagnosis not present

## 2022-12-26 ENCOUNTER — Ambulatory Visit (HOSPITAL_BASED_OUTPATIENT_CLINIC_OR_DEPARTMENT_OTHER): Payer: Medicare PPO | Admitting: Cardiovascular Disease

## 2022-12-26 ENCOUNTER — Encounter: Payer: Self-pay | Admitting: Gastroenterology

## 2022-12-26 DIAGNOSIS — N2581 Secondary hyperparathyroidism of renal origin: Secondary | ICD-10-CM | POA: Diagnosis not present

## 2022-12-26 DIAGNOSIS — Z992 Dependence on renal dialysis: Secondary | ICD-10-CM | POA: Diagnosis not present

## 2022-12-26 DIAGNOSIS — N186 End stage renal disease: Secondary | ICD-10-CM | POA: Diagnosis not present

## 2022-12-27 ENCOUNTER — Encounter: Payer: Medicare PPO | Admitting: Physical Therapy

## 2022-12-28 DIAGNOSIS — Z992 Dependence on renal dialysis: Secondary | ICD-10-CM | POA: Diagnosis not present

## 2022-12-28 DIAGNOSIS — N2581 Secondary hyperparathyroidism of renal origin: Secondary | ICD-10-CM | POA: Diagnosis not present

## 2022-12-28 DIAGNOSIS — N186 End stage renal disease: Secondary | ICD-10-CM | POA: Diagnosis not present

## 2022-12-29 DIAGNOSIS — Z89511 Acquired absence of right leg below knee: Secondary | ICD-10-CM | POA: Diagnosis not present

## 2022-12-30 DIAGNOSIS — N186 End stage renal disease: Secondary | ICD-10-CM | POA: Diagnosis not present

## 2022-12-30 DIAGNOSIS — N2581 Secondary hyperparathyroidism of renal origin: Secondary | ICD-10-CM | POA: Diagnosis not present

## 2022-12-30 DIAGNOSIS — Z992 Dependence on renal dialysis: Secondary | ICD-10-CM | POA: Diagnosis not present

## 2023-01-02 DIAGNOSIS — N186 End stage renal disease: Secondary | ICD-10-CM | POA: Diagnosis not present

## 2023-01-02 DIAGNOSIS — Z992 Dependence on renal dialysis: Secondary | ICD-10-CM | POA: Diagnosis not present

## 2023-01-02 DIAGNOSIS — N2581 Secondary hyperparathyroidism of renal origin: Secondary | ICD-10-CM | POA: Diagnosis not present

## 2023-01-03 DIAGNOSIS — I639 Cerebral infarction, unspecified: Secondary | ICD-10-CM | POA: Diagnosis not present

## 2023-01-04 DIAGNOSIS — N2581 Secondary hyperparathyroidism of renal origin: Secondary | ICD-10-CM | POA: Diagnosis not present

## 2023-01-04 DIAGNOSIS — N186 End stage renal disease: Secondary | ICD-10-CM | POA: Diagnosis not present

## 2023-01-04 DIAGNOSIS — Z992 Dependence on renal dialysis: Secondary | ICD-10-CM | POA: Diagnosis not present

## 2023-01-05 ENCOUNTER — Encounter: Payer: Self-pay | Admitting: Physical Therapy

## 2023-01-05 ENCOUNTER — Other Ambulatory Visit: Payer: Self-pay

## 2023-01-05 ENCOUNTER — Ambulatory Visit: Payer: Medicare PPO | Admitting: Physical Therapy

## 2023-01-05 DIAGNOSIS — R2689 Other abnormalities of gait and mobility: Secondary | ICD-10-CM | POA: Diagnosis not present

## 2023-01-05 DIAGNOSIS — M6281 Muscle weakness (generalized): Secondary | ICD-10-CM | POA: Diagnosis not present

## 2023-01-05 DIAGNOSIS — I69354 Hemiplegia and hemiparesis following cerebral infarction affecting left non-dominant side: Secondary | ICD-10-CM

## 2023-01-05 DIAGNOSIS — L98498 Non-pressure chronic ulcer of skin of other sites with other specified severity: Secondary | ICD-10-CM | POA: Diagnosis not present

## 2023-01-05 DIAGNOSIS — M25562 Pain in left knee: Secondary | ICD-10-CM

## 2023-01-05 DIAGNOSIS — G8929 Other chronic pain: Secondary | ICD-10-CM

## 2023-01-05 DIAGNOSIS — M25561 Pain in right knee: Secondary | ICD-10-CM | POA: Diagnosis not present

## 2023-01-05 DIAGNOSIS — R2681 Unsteadiness on feet: Secondary | ICD-10-CM

## 2023-01-05 NOTE — Therapy (Signed)
OUTPATIENT PHYSICAL THERAPY PROSTHETICS EVALUATION   Patient Name: Carolyn Russo MRN: [id number] DOB:09-30-1951, 72 y.o., female Today's Date: 01/05/2023  PCP: No PCP on file REFERRING PROVIDER: Meridee Score, MD  END OF SESSION:  PT End of Session - 01/05/23 1410     Visit Number 1    Number of Visits 26    Date for PT Re-Evaluation 04/04/23    Authorization Type [employer] Choice PPO    Authorization Time Period $20 co-pay    Progress Note Due on Visit 10    PT Start Time 1311    PT Stop Time 1407    PT Time Calculation (min) 56 min    Equipment Utilized During Treatment Gait belt    Activity Tolerance Patient tolerated treatment well;Patient limited by fatigue;Patient limited by pain    Behavior During Therapy WFL for tasks assessed/performed             Past Medical History:  Diagnosis Date   Acute GI bleeding    Allergy    Anemia    Anterior chest wall pain    Appendicitis 1965   Asthma    Body mass index 37.0-37.9, adult    Breast pain    Cataract    both eyes   CHF (congestive heart failure) ([place])    Chronic kidney disease    stage 5 - on dialysis   Cognitive change 04/20/2021   r/t cva [id number]   Complication of anesthesia    Memory loss after general   Dehydration 2014   Deviated septum 1971   Diabetes mellitus    Dysphagia due to old stroke    easy to get strangled when eating   Dyspnea 2014   Extrinsic asthma    WITH ASTHMA ATTACK   Fibroid 1980   GERD (gastroesophageal reflux disease)    Heart murmur    History of migraine    History of seizure    with stroke   Hx gestational diabetes    Hyperlipidemia    Hypertension 2014   Inguinal hernia 1959   Malaise and fatigue 2014   Non-IgE mediated allergic asthma 2014   Obesity    Pelvic pain    Pregnancy, high-risk 1985   Stroke ([place]) 04/20/2021   (CVA) of right basal ganglia   Tonsillitis 1968   Uterine fibroid 1980   Visual field defect    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   Past  Surgical History:  Procedure Laterality Date   ABDOMINAL AORTOGRAM W/LOWER EXTREMITY N/A 02/21/2022   Procedure: ABDOMINAL AORTOGRAM W/LOWER EXTREMITY;  Surgeon: Waynetta Sandy, MD;  Location: [facility];  Service: Cardiovascular;  Laterality: N/A;   AMPUTATION Right 05/18/2022   Procedure: RIGHT BELOW KNEE AMPUTATION;  Surgeon: Newt Minion, MD;  Location: [facility];  Service: Orthopedics;  Laterality: Right;   AMPUTATION TOE Right 04/15/2022   Procedure: AMPUTATION  LST TOERIGHT FOOT;  Surgeon: Criselda Peaches, DPM;  Location: [facility];  Service: Podiatry;  Laterality: Right;   APPENDECTOMY  0000000   BASCILIC VEIN TRANSPOSITION Right 04/30/2021   Procedure: RIGHT FIRST STAGE [place];  Surgeon: Angelia Mould, MD;  Location: [facility];  Service: Vascular;  Laterality: Right;   CESAREAN SECTION  1985   COLONOSCOPY     ESOPHAGOGASTRODUODENOSCOPY (EGD) WITH PROPOFOL N/A 04/18/2021   Procedure: ESOPHAGOGASTRODUODENOSCOPY (EGD) WITH PROPOFOL;  Surgeon: Gatha Mayer, MD;  Location: [facility];  Service: Endoscopy;  Laterality: N/A;   EYE SURGERY  bilateral cataract    FLEXIBLE SIGMOIDOSCOPY N/A 04/18/2021   Procedure: FLEXIBLE SIGMOIDOSCOPY;  Surgeon: Gatha Mayer, MD;  Location: [facility];  Service: Endoscopy;  Laterality: N/A;   HERNIA REPAIR  1959   IR FLUORO GUIDE CV LINE RIGHT  03/18/2021   IR US GUIDE VASC ACCESS RIGHT  03/18/2021   LEFT HEART CATH AND CORONARY ANGIOGRAPHY N/A 04/04/2017   Procedure: Left Heart Cath and Coronary Angiography;  Surgeon: Belva Crome, MD;  Location: [facility];  Service: Cardiovascular;  Laterality: N/A;   [place], 2004, 2007   PERIPHERAL VASCULAR THROMBECTOMY  02/21/2022   Procedure: PERIPHERAL VASCULAR THROMBECTOMY;  Surgeon: Waynetta Sandy, MD;  Location: [facility];  Service: Cardiovascular;;  Right Tibial   RHINOPLASTY  1971   ROTATOR CUFF REPAIR  2003   SURGICAL REPAIR OF  HEMORRHAGE  2015   TONSILLECTOMY  1968   Patient Active Problem List   Diagnosis Date Noted   Urinary tract infection without hematuria    Diabetes mellitus due to underlying condition with hypoglycemia without coma, with long-term current use of insulin (HCC)    Acute encephalopathy 06/22/2022   Acute respiratory failure ([place])    Unresponsiveness    Pressure ulcer 06/07/2022   Acute stroke due to ischemia ([place]) 06/03/2022   Chest pain 06/01/2022   History of CVA (cerebrovascular accident) 06/01/2022   Seizure ([place])    Encephalopathy    Below-knee amputation of right lower extremity ([place])    Leukocytosis    Foot infection 05/10/2022   PAD (peripheral artery disease) ([place]) 05/10/2022   Gangrene of right foot ([place]) 04/15/2022   Left hemiparesis ([place]) 09/24/2021   Abnormality of gait 09/24/2021   Pain of right hand 08/06/2021   Steal syndrome of dialysis vascular access ([place]) 08/06/2021   Subclavian steal syndrome of right subclavian artery 06/25/2021   Right hand weakness 06/25/2021   Stroke-like symptoms 05/06/2021   Bandemia    Cerebrovascular accident (CVA) of right basal ganglia ([place]) 04/20/2021   External hemorrhoids    Basal ganglia infarction ([place]) 04/16/2021   ESRD on dialysis ([place]) 04/16/2021   Hypertensive urgency 03/10/2021   Hilar enlargement    Anemia of chronic disease    Chronic diastolic CHF (congestive heart failure) ([place]) 03/09/2021   CKD (chronic kidney disease), stage V ([place]) 03/08/2021   Diabetic retinopathy ([place]) 03/08/2021   Hyperglycemia due to type 2 diabetes mellitus ([place]) 03/08/2021   Pure hypercholesterolemia 03/08/2021   Anemia in chronic kidney disease 09/24/2020   Uncontrolled type 2 diabetes mellitus with hyperglycemia ([place]) 03/19/2019   Osteopenia 09/04/2018   Migraine 09/04/2018   Asthma 09/04/2018   Pseudophakia of both eyes 07/17/2018   Pseudophakia of left eye 07/03/2018   Open angle with borderline findings and high glaucoma risk in  left eye 07/03/2018   Age-related nuclear cataract of right eye 07/03/2018   CAD (coronary artery disease), native coronary artery 04/27/2017   Abnormal nuclear stress test 04/04/2017   Shortness of breath 03/09/2017   Appendicitis    Age-related hypermature cataract of both eyes 12/20/2016   Uterine leiomyoma 11/27/2012   Dyslipidemia (high LDL; low HDL) 11/25/2011   Obesity (BMI 30-39.9) 11/25/2011   Hypertensive disorder 11/21/2011   Type 2 diabetes mellitus ([place]) 11/21/2011   Abnormal EKG 11/21/2011    ONSET DATE: 12/29/2022 prosthesis delivery  REFERRING DIAG: [id number] (ICD-10-CM) - Below-knee amputation of right lower extremity   THERAPY DIAG:  Unsteadiness on feet - Plan: PT  plan of care cert/re-cert  Other abnormalities of gait and mobility - Plan: PT plan of care cert/re-cert  Muscle weakness (generalized) - Plan: PT plan of care cert/re-cert  Hemiplegia and hemiparesis following cerebral infarction affecting left non-dominant side ([place]) - Plan: PT plan of care cert/re-cert  Non-pressure chronic ulcer of skin of other sites with other specified severity ([place]) - Plan: PT plan of care cert/re-cert  Chronic pain of both knees - Plan: PT plan of care cert/re-cert  Rationale for Evaluation and Treatment: Rehabilitation  SUBJECTIVE:   SUBJECTIVE STATEMENT: This 72yo female underwent a right Transtibial Amputation on 05/18/2022 due to gangrene.  She was hospitalized 06/22/22 - 06/29/22 with acute encephalopathy with seizure & hypoglycemia. She received her prosthesis on 12/29/2022 working with Brooke Pace, [facility].  Pt accompanied by: significant other and CNA  PERTINENT HISTORY: CVA / left hemiparesis due to right basal ganglia infarct 7/22, right hip bursitis, anxiety, dCHF, ESRD on HD, HTN, DM, CAD  PAIN:  Are you having pain? Yes: NPRS scale: 9/10 Pain location: bilateral knees more patella Pain description: throbbing Aggravating factors: unknown Relieving factors: Voltarin &  massage  PRECAUTIONS: Fall and Other: NO BP RUE  WEIGHT BEARING RESTRICTIONS: No  FALLS: Has patient fallen in last 6 months? No  LIVING ENVIRONMENT: Lives with: lives with their spouse and CNAs 24 hrs 7 days / wk Lives in: House Home Access: Ramped entrance Home layout: Multi level, Full bath on main level, and Able to live on main level with bedroom and bathroom Stairs: Yes: Internal: 16 steps; on left going up and External: 4 steps; on right going up Has following equipment at home: Single point cane, Walker - 2 wheeled, Wheelchair (manual), and shower chair  PLOF: Independent with household mobility with device and Independent with community mobility with device  PATIENT GOALS: walk with prosthesis, stand & transfer.   OBJECTIVE:  COGNITION: Overall cognitive status: History of cognitive impairments - at baseline  POSTURE: rounded shoulders, forward head, decreased lumbar lordosis, increased thoracic kyphosis, and flexed trunk   LOWER EXTREMITY ROM: Eval / 01/05/2023:    BLEs PROM WFL except unable to assess hip ext  (Blank rows = not tested)  LOWER EXTREMITY MMT:  MMT Right eval Left eval  Hip flexion 3-/5   Hip extension 3-/5   Hip abduction 3-/5   Hip adduction    Hip internal rotation    Hip external rotation    Knee flexion 3-/5   Knee extension 3-/5   Ankle dorsiflexion NA   Ankle plantarflexion NA   Ankle inversion NA   Ankle eversion NA   Eval / 01/05/2023: left LE & UE hemiparesis.  In gait slides to advance LLE and knee / hip instability in stance.  (Blank rows = not tested)  TRANSFERS: Sit to stand: Max A pulling BUEs on //bars from w/c Stand to sit: Min A using BUEs //bars to control descent Lateral scoot transfers: Mod A w/c to / from mat table level & adjacent   GAIT: Gait pattern: step to pattern, decreased step length- Right, decreased step length- Left, decreased stance time- Right, decreased stance time- Left, decreased hip/knee flexion-  Left, decreased ankle dorsiflexion- Left, Right hip hike, Left hip hike, knee flexed in stance- Left, antalgic, lateral hip instability, trunk flexed, wide BOS, and poor foot clearance- Left Distance walked: 3' Assistive device utilized: // bars & RLE TTA prosthesis Level of assistance: Total A / 2 person Comments:  LLE with poor clearance / slides  foot forward and knee / hip instability in stance.   FUNCTIONAL TESTs:  Standing in //bars for 30 sec with modA.    CURRENT PROSTHETIC WEAR ASSESSMENT: Eval / 01/05/2023:  Patient is dependent with: skin check, residual limb care, care of non-amputated limb, prosthetic cleaning, ply sock cleaning, correct ply sock adjustment, proper wear schedule/adjustment, and proper weight-bearing schedule/adjustment Donning prosthesis: Total A / family & CNA requires 100% cueing Doffing prosthesis: Total A / family & CNA requires 100% cueing Prosthetic wear tolerance: 1-1.5 hours, 1x/day, 6 of 7 days since delivery Prosthetic weight bearing tolerance: 3 minutes during eval with c/o knee pain Edema: pitting Residual limb condition: lateral incision with 63m X 625mlong scabs with serous drainage, arrived with bandaid over wound,  dry skin, normal temperature & color, cylindrical shape Prosthetic description: silicon liner with pin lock suspension, total contact socket, SACH foot K code/activity level with prosthetic use: Level 1    TODAY'S TREATMENT:                                                                                                                             DATE:  01/05/2023  PATIENT EDUCATION: PATIENT EDUCATED ON FOLLOWING PROSTHETIC CARE: Education details: Use of Vivewear under liner with prosthesis wear, use of shrinker at all times out of prosthesis,   Skin check, Residual limb care, Prosthetic cleaning, Correct ply sock adjustment, [place]pper donning, and Proper wear schedule/adjustment Prosthetic wear tolerance: 2 hours 2x/day, 7  days/week Person educated: Patient, Spouse, and Caregiver CNA Education method: Explanation, Demonstration, Tactile cues, and Verbal cues Education comprehension: verbalized understanding, verbal cues required, tactile cues required, and needs further education  HOME EXERCISE PROGRAM:  ASSESSMENT:  CLINICAL IMPRESSION: Patient is a [age].o. female who was seen today for physical therapy evaluation and treatment for prosthetic training with right Transtibial Amputation and history of left UE & LE hemiparesis. She has significant deconditioning with weakness and low activity tolerance / endurance.  Her family & she are dependent in proper prosthetic care. She has wound on limb. She is only wearing prosthesis 1-1.5 hours / day which limits function. She has pain in both knees that appears chronic but limits activities.  She is dependent in transfers scooting and sit / stand. She only tolerates standing for 30 sec with // bar support with modA. She amb 3' in //bars with 2 person assist with significant gait limitations.  Patient appears would benefit from skilled PT to improve function and safe prosthesis use.   OBJECTIVE IMPAIRMENTS: Abnormal gait, decreased activity tolerance, decreased balance, decreased cognition, decreased coordination, decreased endurance, decreased knowledge of condition, decreased knowledge of use of DME, decreased mobility, difficulty walking, decreased ROM, decreased strength, postural dysfunction, prosthetic dependency , obesity, and pain.   ACTIVITY LIMITATIONS: standing, stairs, transfers, locomotion level, and safe prosthesis use.  PARTICIPATION LIMITATIONS: community activity, church, and household mobility  PERSONAL FACTORS: Age, Fitness, Past/current experiences, Time since onset of injury/illness/exacerbation, and 3+  comorbidities: see PMH  are also affecting patient's functional outcome.   REHAB POTENTIAL: Good  CLINICAL DECISION MAKING: Evolving/moderate  complexity  EVALUATION COMPLEXITY: Moderate   GOALS: Goals reviewed with patient? Yes  SHORT TERM GOALS: Target date: 02/02/2023  Family / CNA donnes prosthesis correctly & verbalizes proper cleaning. Baseline: SEE OBJECTIVE DATA Goal status: INITIAL 2.  Patient tolerates prosthesis >6 hrs total /day without increase in skin issues or limb / knee pain <7/10 with standing. Baseline: SEE OBJECTIVE DATA Goal status: INITIAL  3.  Patient able to stand in //bars with CGA for 60 seconds.  Baseline: SEE OBJECTIVE DATA Goal status: INITIAL  4. Patient ambulates 10' // bars & prosthesis with maxA of 1 person. Baseline: SEE OBJECTIVE DATA Goal status: INITIAL   LONG TERM GOALS: Target date: 04/04/2023  Patient & family / CNA demonstrates & verbalized understanding of prosthetic care to enable safe utilization of prosthesis. Baseline: SEE OBJECTIVE DATA Goal status: INITIAL  Patient tolerates prosthesis wear >80% of awake hours without skin or limb pain issues. Baseline: SEE OBJECTIVE DATA Goal status: INITIAL  Stand balance with walker: static 2 min with supervision to enable ability for standing ADLS. Baseline: SEE OBJECTIVE DATA Goal status: INITIAL  Patient ambulates >50' with RW & prosthesis only with family / CNA assist safely.  Baseline: SEE OBJECTIVE DATA Goal status: INITIAL  Patient transfers scooting with supervision and sit to/from stand with minA w/c to RW.  Baseline: SEE OBJECTIVE DATA Goal status: INITIAL   PLAN:  PT FREQUENCY: 2x/week  PT DURATION: 13 weeks / 90 days  PLANNED INTERVENTIONS: Therapeutic exercises, Therapeutic activity, Neuromuscular re-education, Balance training, Gait training, Patient/Family education, Self Care, Vestibular training, Prosthetic training, DME instructions, and Manual therapy  PLAN FOR NEXT SESSION: review & educate on prosthetic care, work on scooting transfers and sit/stand at sink.   Jamey Reas, PT, DPT 01/05/2023,  2:55 PM  Referring diagnosis? [id number] (ICD-10-CM) - Below-knee amputation of right lower extremity  Treatment diagnosis? (if different than referring diagnosis)  Unsteadiness on feet R26.81  Other abnormalities of gait and mobility R26.89  Muscle weakness (generalized) M62.81  Hemiplegia and hemiparesis following cerebral infarction affecting left non-dominant side (HCC) I69.354  Non-pressure chronic ulcer of skin of other sites with other specified severity [facility]) L98.498  Chronic pain of both knees M25.561, M25.562, G89.29  What was this (referring dx) caused by? '[x]'$  Surgery '[]'$  Fall '[]'$  Ongoing issue '[]'$  Arthritis '[]'$  Other: ____________  Laterality: '[x]'$  Rt '[]'$  Lt '[]'$  Both  Check all possible CPT codes:  *CHOOSE 10 OR LESS*    '[x]'$  97110 (Therapeutic Exercise)  '[]'$  92507 (SLP Treatment)  '[x]'$  97112 (Neuro Re-ed)   '[]'$  92526 (Swallowing Treatment)   '[x]'$  97116 (Gait Training)   '[]'$  [id number] (Cognitive Training, 1st 15 minutes) '[]'$  97140 (Manual Therapy)   '[]'$  97130 (Cognitive Training, each add'l 15 minutes)  '[x]'$  97164 (Re-evaluation)                              '[]'$  Other, List CPT Code ____________  '[x]'$  [id number] (Therapeutic Activities)     '[x]'$  97535 (Self Care)   '[]'$  All codes above (97110 - 97535)  '[]'$  97012 (Mechanical Traction)  '[]'$  97014 (E-stim Unattended)  '[]'$  97032 (E-stim manual)  '[]'$  97033 (Ionto)  '[]'$  97035 (Ultrasound) '[x]'$  97750 (Physical Performance Training) '[]'$  [id number] (Aquatic Therapy) '[]'$  97016 (Vasopneumatic Device) '[]'$  [id number] (Paraffin) '[]'$  97034 (Contrast Bath) '[]'$  [id number] (Wound Care  1st 20 sq cm) '[]'$  97598 (Wound Care each add'l 20 sq cm) '[]'$  97760 (Orthotic Fabrication, Fitting, Training Initial) '[x]'$  [id number] (Prosthetic Management and Training Initial) '[]'$  [id number] (Orthotic or Prosthetic Training/ Modification Subsequent)

## 2023-01-06 DIAGNOSIS — Z992 Dependence on renal dialysis: Secondary | ICD-10-CM | POA: Diagnosis not present

## 2023-01-06 DIAGNOSIS — N2581 Secondary hyperparathyroidism of renal origin: Secondary | ICD-10-CM | POA: Diagnosis not present

## 2023-01-06 DIAGNOSIS — N186 End stage renal disease: Secondary | ICD-10-CM | POA: Diagnosis not present

## 2023-01-09 DIAGNOSIS — Z992 Dependence on renal dialysis: Secondary | ICD-10-CM | POA: Diagnosis not present

## 2023-01-09 DIAGNOSIS — S88111S Complete traumatic amputation at level between knee and ankle, right lower leg, sequela: Secondary | ICD-10-CM | POA: Diagnosis not present

## 2023-01-09 DIAGNOSIS — I2510 Atherosclerotic heart disease of native coronary artery without angina pectoris: Secondary | ICD-10-CM | POA: Diagnosis not present

## 2023-01-09 DIAGNOSIS — I5032 Chronic diastolic (congestive) heart failure: Secondary | ICD-10-CM | POA: Diagnosis not present

## 2023-01-09 DIAGNOSIS — N186 End stage renal disease: Secondary | ICD-10-CM | POA: Diagnosis not present

## 2023-01-09 DIAGNOSIS — M6281 Muscle weakness (generalized): Secondary | ICD-10-CM | POA: Diagnosis not present

## 2023-01-09 DIAGNOSIS — N2581 Secondary hyperparathyroidism of renal origin: Secondary | ICD-10-CM | POA: Diagnosis not present

## 2023-01-10 ENCOUNTER — Ambulatory Visit (INDEPENDENT_AMBULATORY_CARE_PROVIDER_SITE_OTHER): Payer: Medicare PPO | Admitting: Physical Therapy

## 2023-01-10 ENCOUNTER — Encounter (HOSPITAL_BASED_OUTPATIENT_CLINIC_OR_DEPARTMENT_OTHER): Payer: Self-pay | Admitting: Cardiovascular Disease

## 2023-01-10 ENCOUNTER — Encounter: Payer: Self-pay | Admitting: Physical Therapy

## 2023-01-10 ENCOUNTER — Ambulatory Visit (HOSPITAL_BASED_OUTPATIENT_CLINIC_OR_DEPARTMENT_OTHER): Payer: Medicare PPO | Admitting: Cardiovascular Disease

## 2023-01-10 VITALS — BP 168/72 | HR 67 | Ht 65.0 in | Wt 182.0 lb

## 2023-01-10 DIAGNOSIS — I10 Essential (primary) hypertension: Secondary | ICD-10-CM | POA: Diagnosis not present

## 2023-01-10 DIAGNOSIS — R2681 Unsteadiness on feet: Secondary | ICD-10-CM | POA: Diagnosis not present

## 2023-01-10 DIAGNOSIS — I6381 Other cerebral infarction due to occlusion or stenosis of small artery: Secondary | ICD-10-CM | POA: Diagnosis not present

## 2023-01-10 DIAGNOSIS — I69354 Hemiplegia and hemiparesis following cerebral infarction affecting left non-dominant side: Secondary | ICD-10-CM

## 2023-01-10 DIAGNOSIS — R2689 Other abnormalities of gait and mobility: Secondary | ICD-10-CM

## 2023-01-10 DIAGNOSIS — G458 Other transient cerebral ischemic attacks and related syndromes: Secondary | ICD-10-CM

## 2023-01-10 DIAGNOSIS — I5032 Chronic diastolic (congestive) heart failure: Secondary | ICD-10-CM | POA: Diagnosis not present

## 2023-01-10 DIAGNOSIS — I160 Hypertensive urgency: Secondary | ICD-10-CM | POA: Diagnosis not present

## 2023-01-10 DIAGNOSIS — I2510 Atherosclerotic heart disease of native coronary artery without angina pectoris: Secondary | ICD-10-CM

## 2023-01-10 DIAGNOSIS — I739 Peripheral vascular disease, unspecified: Secondary | ICD-10-CM | POA: Diagnosis not present

## 2023-01-10 DIAGNOSIS — L98498 Non-pressure chronic ulcer of skin of other sites with other specified severity: Secondary | ICD-10-CM

## 2023-01-10 DIAGNOSIS — M6281 Muscle weakness (generalized): Secondary | ICD-10-CM | POA: Diagnosis not present

## 2023-01-10 MED ORDER — HYDRALAZINE HCL 25 MG PO TABS: 25.0000 mg | ORAL_TABLET | Freq: Two times a day (BID) | ORAL | 3 refills | Status: DC

## 2023-01-10 NOTE — Patient Instructions (Addendum)
Medication Instructions:  START HYDRALAZINE 25 MG TWICE A DAY   Labwork: NONE  Testing/Procedures: NONE  Follow-Up: 02/16/2023 2:45 PM VIRTUAL VISIT WITH CAITLIN W NP   Any Other Special Instructions Will Be Listed Below (If Applicable). MONITOR BLOOD PRESSURE TWICE A DAY AND LOG. HAVE LOG AVAILABLE AT FOLLOW UP VISIT   If you need a refill on your cardiac medications before your next appointment, please call your pharmacy.

## 2023-01-10 NOTE — Therapy (Signed)
OUTPATIENT PHYSICAL THERAPY TREATMENT   Patient Name: Carolyn Russo MRN: [id number] DOB:03-17-1951, 72 y.o., female Today's Date: 01/10/2023  PCP: No PCP on file REFERRING PROVIDER: Meridee Score, MD  END OF SESSION:  PT End of Session - 01/10/23 1434     Visit Number 2    Number of Visits 26    Date for PT Re-Evaluation 04/04/23    Authorization Type [employer] Choice PPO    Authorization Time Period $20 co-pay    Progress Note Due on Visit 10    PT Start Time 1431    PT Stop Time 1513    PT Time Calculation (min) 42 min    Equipment Utilized During Treatment Gait belt    Activity Tolerance Patient tolerated treatment well;Patient limited by fatigue;Patient limited by pain    Behavior During Therapy WFL for tasks assessed/performed              Past Medical History:  Diagnosis Date   Acute GI bleeding    Allergy    Anemia    Anterior chest wall pain    Appendicitis 1965   Asthma    Body mass index 37.0-37.9, adult    Breast pain    Cataract    both eyes   CHF (congestive heart failure) ([place])    Chronic kidney disease    stage 5 - on dialysis   Cognitive change 04/20/2021   r/t cva [id number]   Complication of anesthesia    Memory loss after general   Dehydration 2014   Deviated septum 1971   Diabetes mellitus    Dysphagia due to old stroke    easy to get strangled when eating   Dyspnea 2014   Extrinsic asthma    WITH ASTHMA ATTACK   Fibroid 1980   GERD (gastroesophageal reflux disease)    Heart murmur    History of migraine    History of seizure    with stroke   Hx gestational diabetes    Hyperlipidemia    Hypertension 2014   Inguinal hernia 1959   Malaise and fatigue 2014   Non-IgE mediated allergic asthma 2014   Obesity    Pelvic pain    Pregnancy, high-risk 1985   Stroke ([place]) 04/20/2021   (CVA) of right basal ganglia   Tonsillitis 1968   Uterine fibroid 1980   Visual field defect    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   Past Surgical History:   Procedure Laterality Date   ABDOMINAL AORTOGRAM W/LOWER EXTREMITY N/A 02/21/2022   Procedure: ABDOMINAL AORTOGRAM W/LOWER EXTREMITY;  Surgeon: Waynetta Sandy, MD;  Location: [place];  Service: Cardiovascular;  Laterality: N/A;   AMPUTATION Right 05/18/2022   Procedure: RIGHT BELOW KNEE AMPUTATION;  Surgeon: Newt Minion, MD;  Location: [place];  Service: Orthopedics;  Laterality: Right;   AMPUTATION TOE Right 04/15/2022   Procedure: AMPUTATION  LST TOERIGHT FOOT;  Surgeon: Criselda Peaches, DPM;  Location: [facility];  Service: Podiatry;  Laterality: Right;   APPENDECTOMY  0000000   BASCILIC VEIN TRANSPOSITION Right 04/30/2021   Procedure: RIGHT FIRST STAGE [place];  Surgeon: Angelia Mould, MD;  Location: [place];  Service: Vascular;  Laterality: Right;   CESAREAN SECTION  1985   COLONOSCOPY     ESOPHAGOGASTRODUODENOSCOPY (EGD) WITH PROPOFOL N/A 04/18/2021   Procedure: ESOPHAGOGASTRODUODENOSCOPY (EGD) WITH PROPOFOL;  Surgeon: Gatha Mayer, MD;  Location: [place];  Service: Endoscopy;  Laterality: N/A;   EYE SURGERY  bilateral cataract    FLEXIBLE SIGMOIDOSCOPY N/A 04/18/2021   Procedure: FLEXIBLE SIGMOIDOSCOPY;  Surgeon: Gatha Mayer, MD;  Location: [facility];  Service: Endoscopy;  Laterality: N/A;   HERNIA REPAIR  1959   IR FLUORO GUIDE CV LINE RIGHT  03/18/2021   IR US GUIDE VASC ACCESS RIGHT  03/18/2021   LEFT HEART CATH AND CORONARY ANGIOGRAPHY N/A 04/04/2017   Procedure: Left Heart Cath and Coronary Angiography;  Surgeon: Belva Crome, MD;  Location: [place] [facility];  Service: Cardiovascular;  Laterality: N/A;   [place], 2004, 2007   PERIPHERAL VASCULAR THROMBECTOMY  02/21/2022   Procedure: PERIPHERAL VASCULAR THROMBECTOMY;  Surgeon: Waynetta Sandy, MD;  Location: [place] [facility];  Service: Cardiovascular;;  Right Tibial   RHINOPLASTY  1971   ROTATOR CUFF REPAIR  2003   SURGICAL REPAIR OF HEMORRHAGE  2015    TONSILLECTOMY  1968   Patient Active Problem List   Diagnosis Date Noted   Urinary tract infection without hematuria    Diabetes mellitus due to underlying condition with hypoglycemia without coma, with long-term current use of insulin (HCC)    Acute encephalopathy 06/22/2022   Acute respiratory failure ([place])    Unresponsiveness    Pressure ulcer 06/07/2022   Acute stroke due to ischemia ([place]) 06/03/2022   Chest pain 06/01/2022   History of CVA (cerebrovascular accident) 06/01/2022   Seizure ([place])    Encephalopathy    Below-knee amputation of right lower extremity ([place])    Leukocytosis    Foot infection 05/10/2022   PAD (peripheral artery disease) ([place]) 05/10/2022   Gangrene of right foot ([place]) 04/15/2022   Left hemiparesis ([place]) 09/24/2021   Abnormality of gait 09/24/2021   Pain of right hand 08/06/2021   Steal syndrome of dialysis vascular access ([place]) 08/06/2021   Subclavian steal syndrome of right subclavian artery 06/25/2021   Right hand weakness 06/25/2021   Stroke-like symptoms 05/06/2021   Bandemia    Cerebrovascular accident (CVA) of right basal ganglia ([place]) 04/20/2021   External hemorrhoids    Basal ganglia infarction ([place]) 04/16/2021   ESRD on dialysis ([place]) 04/16/2021   Hypertensive urgency 03/10/2021   Hilar enlargement    Anemia of chronic disease    Chronic diastolic CHF (congestive heart failure) ([place]) 03/09/2021   CKD (chronic kidney disease), stage V ([place]) 03/08/2021   Diabetic retinopathy ([place]) 03/08/2021   Hyperglycemia due to type 2 diabetes mellitus ([place]) 03/08/2021   Pure hypercholesterolemia 03/08/2021   Anemia in chronic kidney disease 09/24/2020   Uncontrolled type 2 diabetes mellitus with hyperglycemia ([place]) 03/19/2019   Osteopenia 09/04/2018   Migraine 09/04/2018   Asthma 09/04/2018   Pseudophakia of both eyes 07/17/2018   Pseudophakia of left eye 07/03/2018   Open angle with borderline findings and high glaucoma risk in left eye 07/03/2018    Age-related nuclear cataract of right eye 07/03/2018   CAD (coronary artery disease), native coronary artery 04/27/2017   Abnormal nuclear stress test 04/04/2017   Shortness of breath 03/09/2017   Appendicitis    Age-related hypermature cataract of both eyes 12/20/2016   Uterine leiomyoma 11/27/2012   Dyslipidemia (high LDL; low HDL) 11/25/2011   Obesity (BMI 30-39.9) 11/25/2011   Hypertensive disorder 11/21/2011   Type 2 diabetes mellitus ([place]) 11/21/2011   Abnormal EKG 11/21/2011    ONSET DATE: 12/29/2022 prosthesis delivery  REFERRING DIAG: [id number] (ICD-10-CM) - Below-knee amputation of right lower extremity   THERAPY DIAG:  Unsteadiness on feet  Other abnormalities  of gait and mobility  Muscle weakness (generalized)  Hemiplegia and hemiparesis following cerebral infarction affecting left non-dominant side (HCC)  Non-pressure chronic ulcer of skin of other sites with other specified severity [facility])  Rationale for Evaluation and Treatment: Rehabilitation  SUBJECTIVE:   SUBJECTIVE STATEMENT: She wore prosthesis 2 hrs 1x/day.  Her CNA put prosthesis on limb.  Pt accompanied by: significant other and CNA  PERTINENT HISTORY: CVA / left hemiparesis due to right basal ganglia infarct 7/22, right hip bursitis, anxiety, dCHF, ESRD on HD, HTN, DM, CAD  PAIN:  Are you having pain?  Yes: NPRS scale: 3/10 Pain location: bilateral knees more patella Pain description: throbbing Aggravating factors: unknown Relieving factors: Voltarin & massage  PRECAUTIONS: Fall and Other: NO BP RUE  WEIGHT BEARING RESTRICTIONS: No  FALLS: Has patient fallen in last 6 months? No  LIVING ENVIRONMENT: Lives with: lives with their spouse and CNAs 24 hrs 7 days / wk Lives in: House Home Access: Ramped entrance Home layout: Multi level, Full bath on main level, and Able to live on main level with bedroom and bathroom Stairs: Yes: Internal: 16 steps; on left going up and External: 4 steps; on  right going up Has following equipment at home: Single point cane, Walker - 2 wheeled, Wheelchair (manual), and shower chair  PLOF: Independent with household mobility with device and Independent with community mobility with device  PATIENT GOALS: walk with prosthesis, stand & transfer.   OBJECTIVE:  COGNITION: Overall cognitive status: History of cognitive impairments - at baseline  POSTURE: rounded shoulders, forward head, decreased lumbar lordosis, increased thoracic kyphosis, and flexed trunk   LOWER EXTREMITY ROM: Eval / 01/05/2023:    BLEs PROM WFL except unable to assess hip ext  (Blank rows = not tested)  LOWER EXTREMITY MMT:  MMT Right eval Left eval  Hip flexion 3-/5   Hip extension 3-/5   Hip abduction 3-/5   Hip adduction    Hip internal rotation    Hip external rotation    Knee flexion 3-/5   Knee extension 3-/5   Ankle dorsiflexion NA   Ankle plantarflexion NA   Ankle inversion NA   Ankle eversion NA   Eval / 01/05/2023: left LE & UE hemiparesis.  In gait slides to advance LLE and knee / hip instability in stance.  (Blank rows = not tested)  TRANSFERS: Sit to stand: Max A pulling BUEs on //bars from w/c Stand to sit: Min A using BUEs //bars to control descent Lateral scoot transfers: Mod A w/c to / from mat table level & adjacent   GAIT: Gait pattern: step to pattern, decreased step length- Right, decreased step length- Left, decreased stance time- Right, decreased stance time- Left, decreased hip/knee flexion- Left, decreased ankle dorsiflexion- Left, Right hip hike, Left hip hike, knee flexed in stance- Left, antalgic, lateral hip instability, trunk flexed, wide BOS, and poor foot clearance- Left Distance walked: 3' Assistive device utilized: // bars & RLE TTA prosthesis Level of assistance: Total A / 2 person Comments:  LLE with poor clearance / slides foot forward and knee / hip instability in stance.   FUNCTIONAL TESTs:  Standing in //bars for 30 sec  with modA.    CURRENT PROSTHETIC WEAR ASSESSMENT: Eval / 01/05/2023:  Patient is dependent with: skin check, residual limb care, care of non-amputated limb, prosthetic cleaning, ply sock cleaning, correct ply sock adjustment, proper wear schedule/adjustment, and proper weight-bearing schedule/adjustment Donning prosthesis: Total A / family & CNA requires 100%  cueing Doffing prosthesis: Total A / family & CNA requires 100% cueing Prosthetic wear tolerance: 1-1.5 hours, 1x/day, 6 of 7 days since delivery Prosthetic weight bearing tolerance: 3 minutes during eval with c/o knee pain Edema: pitting Residual limb condition: lateral incision with 53mm X 65mm long scabs with serous drainage, arrived with bandaid over wound,  dry skin, normal temperature & color, cylindrical shape Prosthetic description: silicon liner with pin lock suspension, total contact socket, SACH foot K code/activity level with prosthetic use: Level 1    TODAY'S TREATMENT:                                                                                                                             DATE:  01/10/2023: Prosthetic Training with TTA prosthesis: Pt has 2 wounds on lateral incision with 30mm X 53mm long scabs with serous drainage. PT recommends continue with Vive wear under liner for wound healing.   PT recommended not using skin moisturizer during day when wearing prosthesis. Apply only at night & wipe off in morning. Pt recommended prosthesis wear 2hrs 2-3x/day. Pt, husband & CNA verbalized understanding.   Neuromuscular Re-education: At sink with PT assisting on left side due to hemiparesis: PT demo & verbal cues on sit to/from stand technique. 22" w/c to sink with maxA and constant verbal cues. Pt improved her weight shift to arise. Pt able to push off w/c to start to raise pelvis then reaches to sink to pull herself forward. Pt able to push trunk & LEs into ext to erect herself with verbal cues. Otherwise she would remain  in flexed posture.  10 reps total during session.   Resting HR 71 after multiple stands HR 77.  SpO2 remained 100% Pelvic weight shift midline, right, midline, left ...  Only tolerated 1 rep 1st time and 3 reps 2nd time.  Pt c/o pain on residual limb with weight shift to right.  Moving cones side to side of sink. 1 rep with LUE & 1 rep with RUE 1st set and 2 reps 2nd set. Moving cones reaching upward 2 reps ea UE. LUE limited by hemiparesis.     01/05/2023  PATIENT EDUCATION: PATIENT EDUCATED ON FOLLOWING PROSTHETIC CARE: Education details: Use of Vivewear under liner with prosthesis wear, use of shrinker at all times out of prosthesis,   Skin check, Residual limb care, Prosthetic cleaning, Correct ply sock adjustment, [place] donning, and Proper wear schedule/adjustment Prosthetic wear tolerance: 2 hours 2x/day, 7 days/week Person educated: Patient, Spouse, and Caregiver CNA Education method: Explanation, Demonstration, Tactile cues, and Verbal cues Education comprehension: verbalized understanding, verbal cues required, tactile cues required, and needs further education  HOME EXERCISE PROGRAM:  ASSESSMENT:  CLINICAL IMPRESSION: PT worked on sit to / from stand at sink which improved but still needs skilled assistance at this time.  Pt improved standing balance with short reaching within length of UEs.  Patient appears to need more skilled instruction before attempting with family  outside of PT.   OBJECTIVE IMPAIRMENTS: Abnormal gait, decreased activity tolerance, decreased balance, decreased cognition, decreased coordination, decreased endurance, decreased knowledge of condition, decreased knowledge of use of DME, decreased mobility, difficulty walking, decreased ROM, decreased strength, postural dysfunction, prosthetic dependency , obesity, and pain.   ACTIVITY LIMITATIONS: standing, stairs, transfers, locomotion level, and safe prosthesis use.  PARTICIPATION LIMITATIONS: community  activity, church, and household mobility  PERSONAL FACTORS: Age, Fitness, Past/current experiences, Time since onset of injury/illness/exacerbation, and 3+ comorbidities: see PMH  are also affecting patient's functional outcome.   REHAB POTENTIAL: Good  CLINICAL DECISION MAKING: Evolving/moderate complexity  EVALUATION COMPLEXITY: Moderate   GOALS: Goals reviewed with patient? Yes  SHORT TERM GOALS: Target date: 02/02/2023  Family / CNA donnes prosthesis correctly & verbalizes proper cleaning. Baseline: SEE OBJECTIVE DATA Goal status: INITIAL 2.  Patient tolerates prosthesis >6 hrs total /day without increase in skin issues or limb / knee pain <7/10 with standing. Baseline: SEE OBJECTIVE DATA Goal status: INITIAL  3.  Patient able to stand in //bars with CGA for 60 seconds.  Baseline: SEE OBJECTIVE DATA Goal status: INITIAL  4. Patient ambulates 10' // bars & prosthesis with maxA of 1 person. Baseline: SEE OBJECTIVE DATA Goal status: INITIAL   LONG TERM GOALS: Target date: 04/04/2023  Patient & family / CNA demonstrates & verbalized understanding of prosthetic care to enable safe utilization of prosthesis. Baseline: SEE OBJECTIVE DATA Goal status: INITIAL  Patient tolerates prosthesis wear >80% of awake hours without skin or limb pain issues. Baseline: SEE OBJECTIVE DATA Goal status: INITIAL  Stand balance with walker: static 2 min with supervision to enable ability for standing ADLS. Baseline: SEE OBJECTIVE DATA Goal status: INITIAL  Patient ambulates >50' with RW & prosthesis only with family / CNA assist safely.  Baseline: SEE OBJECTIVE DATA Goal status: INITIAL  Patient transfers scooting with supervision and sit to/from stand with minA w/c to RW.  Baseline: SEE OBJECTIVE DATA Goal status: INITIAL   PLAN:  PT FREQUENCY: 2x/week  PT DURATION: 13 weeks / 90 days  PLANNED INTERVENTIONS: Therapeutic exercises, Therapeutic activity, Neuromuscular  re-education, Balance training, Gait training, Patient/Family education, Self Care, Vestibular training, Prosthetic training, DME instructions, and Manual therapy  PLAN FOR NEXT SESSION: review donning & wear recommendations, work on scooting transfers and sit/stand at sink along with standing balance activities.   Jamey Reas, PT, DPT 01/10/2023, 3:50 PM

## 2023-01-10 NOTE — Progress Notes (Signed)
Cardiology Office Note:    Date:  02/16/2023   ID:  Carolyn Russo, DOB Sep 12, 1951, MRN [id number]  PCP:  Dorothyann Peng, MD   [facility] Providers Cardiologist:  Lesleigh Noe, MD (Inactive)     Referring MD: Dorothyann Peng, MD   No chief complaint on file.   History of Present Illness:    Carolyn Russo is a 72 y.o. female with a hx of hypertension, hyperlipidemia, diabetes mellitus 2, CVA/TIA 2022 right basal ganglia, cognitive impairment, end stage kidney disease on chronic dialysis, non obstructive COPD here for follow up.   She was seen 4/23 by Dr. Verdis Prime MD. Rosuvastatin 20 mg per day was continued to lower LDL to less than 70. In proceeding year suffered stroke, had a GI bleed and was started on dialysis. She underwent aortagram and laser arthorectomy of the right posterior tibial with stent  by Dr Brooke Dare on 02/2022. Prior to that she had a toe amputation. At her follow up visit 04/2022 she had gangrenous changes at amp site and he recommended R BKA v. AKA. She was hospitalized with hypoglycemia and confusion. Head Ct showed no acute changes and blood glucose in the 40s. Last saw Dr Kirtland Bouchard 08/2022 and declined R BKA at that time. She was undergoing chronic dialysis and it was noted she did not tolerate dialysis well. A1c had improved to 7.4 and blood pressure was controlled. HCTZ was held on the days that she underwent dialysis.   Today, she reported that she "is a mess"  but she is doing well overall today. Lately she has been feeling fatigued and tired. She just came from PT and is especially feeling tired after that appointment. Despite this, she is feeling that she is making progress. Monday, Wednesday and Friday she is undergoing her dialysis. She is still getting quite sick during her appointments.At times she has to cut her appointments early due to feeling so nauseated. Her blood pressure can be challenging with the dialysis. Originally it was getting high, but now it  is getting low if they do not monitor closely and go slow. Her leg has healed well and she has been able to get her prosthesis. In office today her initial blood pressure was a little high, 142/86. On retake it had actually increased to 168/72. They try to monitor at home and it has been up and down. Up to systolic of 200, some diastolic of 100 or 110. More recently it has been a little better, 160-170. Those are the highest numbers they are seeing. Average it is probably staying in the 150-160 systolic. She is seeing Dr. Mardella Layman for nephrology and she is seeing Dr.Issifu for home care visits. Her family member states she get some swelling but it a lot better since starting the dialysis. Her family member mention that her port has been itching her very bad. She denies any palpitations, chest pain, shortness of breath. No lightheadedness, headaches, syncope, orthopnea, or PND.    Past Medical History:  Diagnosis Date   Acute GI bleeding    Allergy    Anemia    Anterior chest wall pain    Appendicitis 1965   Asthma    Body mass index 37.0-37.9, adult    Breast pain    Cataract    both eyes   CHF (congestive heart failure)    Chronic kidney disease    stage 5 - on dialysis   Cognitive change 04/20/2021   r/t cva 03/2821  Complication of anesthesia    Memory loss after general   Dehydration 2014   Deviated septum 1971   Diabetes mellitus    Dysphagia due to old stroke    easy to get strangled when eating   Dyspnea 2014   Extrinsic asthma    WITH ASTHMA ATTACK   Fibroid 1980   GERD (gastroesophageal reflux disease)    Heart murmur    History of migraine    History of seizure    with stroke   Hx gestational diabetes    Hyperlipidemia    Hypertension 2014   Inguinal hernia 1959   Malaise and fatigue 2014   Non-IgE mediated allergic asthma 2014   Obesity    Pelvic pain    Pregnancy, high-risk 1985   Stroke 04/20/2021   (CVA) of right basal ganglia   Tonsillitis 1968   Uterine  fibroid 1980   Visual field defect    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    Past Surgical History:  Procedure Laterality Date   ABDOMINAL AORTOGRAM W/LOWER EXTREMITY N/A 02/21/2022   Procedure: ABDOMINAL AORTOGRAM W/LOWER EXTREMITY;  Surgeon: Maeola Harman, MD;  Location: [facility] INVASIVE CV LAB;  Service: Cardiovascular;  Laterality: N/A;   AMPUTATION Right 05/18/2022   Procedure: RIGHT BELOW KNEE AMPUTATION;  Surgeon: Nadara Mustard, MD;  Location: [facility] OR;  Service: Orthopedics;  Laterality: Right;   AMPUTATION TOE Right 04/15/2022   Procedure: AMPUTATION  LST TOERIGHT FOOT;  Surgeon: Edwin Cap, DPM;  Location: [facility] ORS;  Service: Podiatry;  Laterality: Right;   APPENDECTOMY  1959   BASCILIC VEIN TRANSPOSITION Right 04/30/2021   Procedure: RIGHT FIRST STAGE BASCILIC VEIN TRANSPOSITION;  Surgeon: Chuck Hint, MD;  Location: [facility] OR;  Service: Vascular;  Laterality: Right;   CESAREAN SECTION  1985   COLONOSCOPY     ESOPHAGOGASTRODUODENOSCOPY (EGD) WITH PROPOFOL N/A 04/18/2021   Procedure: ESOPHAGOGASTRODUODENOSCOPY (EGD) WITH PROPOFOL;  Surgeon: Iva Boop, MD;  Location: [facility] ENDOSCOPY;  Service: Endoscopy;  Laterality: N/A;   EYE SURGERY     bilateral cataract    FLEXIBLE SIGMOIDOSCOPY N/A 04/18/2021   Procedure: FLEXIBLE SIGMOIDOSCOPY;  Surgeon: Iva Boop, MD;  Location: [facility] ENDOSCOPY;  Service: Endoscopy;  Laterality: N/A;   HERNIA REPAIR  1959   IR FLUORO GUIDE CV LINE RIGHT  03/18/2021   IR US GUIDE VASC ACCESS RIGHT  03/18/2021   LEFT HEART CATH AND CORONARY ANGIOGRAPHY N/A 04/04/2017   Procedure: Left Heart Cath and Coronary Angiography;  Surgeon: Lyn Records, MD;  Location: [facility] INVASIVE CV LAB;  Service: Cardiovascular;  Laterality: N/A;   MYOMECTOMY  1980, 2004, 2007   PERIPHERAL VASCULAR THROMBECTOMY  02/21/2022   Procedure: PERIPHERAL VASCULAR THROMBECTOMY;  Surgeon: Maeola Harman, MD;  Location: [facility] INVASIVE CV LAB;  Service: Cardiovascular;;  Right Tibial    RHINOPLASTY  1971   ROTATOR CUFF REPAIR  2003   SURGICAL REPAIR OF HEMORRHAGE  2015   TONSILLECTOMY  1968    Current Medications: Current Meds  Medication Sig   acetaminophen (TYLENOL) 500 MG tablet Take 1,000 mg by mouth 2 (two) times daily as needed (leg and arm pain).   aspirin EC 81 MG tablet Take 1 tablet (81 mg total) by mouth daily. Swallow whole.   carvedilol (COREG) 3.125 MG tablet Take 1 tablet (3.125 mg total) by mouth 2 (two) times daily with a meal.   diclofenac Sodium (VOLTAREN) 1 % GEL Apply 2 g topically 4 (four) times daily. To left  wrist   Glucagon, rDNA, (GLUCAGON EMERGENCY) 1 MG KIT Inject 1 mg into the vein as needed (CBG of 65mg /dL).   hydrALAZINE (APRESOLINE) 25 MG tablet Take 1 tablet (25 mg total) by mouth in the morning and at bedtime.   insulin aspart (NOVOLOG) 100 UNIT/ML injection Inject 0-6 Units into the skin 3 (three) times daily with meals. CBG < 70: Implement Hypoglycemia Standing Orders and refer to Hypoglycemia Standing Orders sidebar report CBG 70 - 120: 0 units CBG 121 - 150: 0 units CBG 151 - 200: 1 unit CBG 201-250: 2 units CBG 251-300: 3 units CBG 301-350: 4 units CBG 351-400: 5 units CBG > 400: Give 6 units and call MD   insulin glargine (LANTUS) 100 UNIT/ML Solostar Pen Inject 5 Units into the skin at bedtime.   levETIRAcetam (KEPPRA) 250 MG tablet Take 1.5 tablets (375 mg total) by mouth every Monday, Wednesday, and Friday at 6 PM.   levETIRAcetam (KEPPRA) 750 MG tablet Take 1 tablet (750 mg total) by mouth at bedtime.   losartan (COZAAR) 50 MG tablet Take 50 mg by mouth 2 (two) times daily.   methylphenidate (RITALIN) 5 MG tablet Take 5 mg by mouth daily.   mometasone-formoterol (DULERA) 200-5 MCG/ACT AERO Inhale 2 puffs into the lungs 2 (two) times daily.   pantoprazole (PROTONIX) 40 MG tablet Take 1 tablet (40 mg total) by mouth daily. (Patient taking differently: Take 40 mg by mouth at bedtime.)   rosuvastatin (CRESTOR) 20 MG tablet  Take 1 tablet (20 mg total) by mouth daily. (Patient taking differently: Take 20 mg by mouth at bedtime.)   sevelamer carbonate (RENVELA) 0.8 g PACK packet Take 0.8 g by mouth 3 (three) times daily.   ticagrelor (BRILINTA) 90 MG TABS tablet Take 1 tablet (90 mg total) by mouth 2 (two) times daily.   [DISCONTINUED] cefadroxil (DURICEF) 500 MG capsule Take 500 mg by mouth 2 (two) times daily.   [DISCONTINUED] losartan (COZAAR) 100 MG tablet Take 100 mg by mouth daily.   [DISCONTINUED] methylphenidate (RITALIN) 5 MG tablet Take 1 tablet (5 mg total) by mouth 2 (two) times daily. (Patient taking differently: Take 5 mg by mouth daily.)   [DISCONTINUED] midodrine (PROAMATINE) 10 MG tablet Take 1 tablet (10 mg total) by mouth every Monday, Wednesday, and Friday with hemodialysis. (Patient taking differently: Take 10 mg by mouth every Monday, Wednesday, and Friday with hemodialysis. AS NEEDED)   [DISCONTINUED] midodrine (PROAMATINE) 10 MG tablet Take 10 mg by mouth as directed. 1 TABLET MONDAY, WEDNESDAY, AND FRIDAY WITH HEMODIALYSIS AS NEEDED   [DISCONTINUED] senna-docusate (SENOKOT-S) 8.6-50 MG tablet Take 2 tablets by mouth 2 (two) times daily.     Allergies:   Food, Statins, Gadolinium derivatives, Januvia [sitagliptin], Pork-derived products, Shellfish allergy, and Tetracycline   Social History   Socioeconomic History   Marital status: Married    Spouse name: Channing Mutters   Number of children: 1   Years of education: Not on file   Highest education level: Professional school degree (e.g., MD, DDS, DVM, JD)  Occupational History   Occupation: retired  Tobacco Use   Smoking status: Never   Smokeless tobacco: Never  Vaping Use   Vaping Use: Never used  Substance and Sexual Activity   Alcohol use: No   Drug use: No   Sexual activity: Not Currently    Birth control/protection: Post-menopausal  Other Topics Concern   Not on file  Social History Narrative   06/21/21 lives with husband Dr Chalmers Guest,  caregiver- Joyce Gross   Patient reports she used to walk at [employer] but now due to Covid-19 she is staying inside.   Social Determinants of Health   Financial Resource Strain: Low Risk  (09/02/2021)   Overall Financial Resource Strain (CARDIA)    Difficulty of Paying Living Expenses: Not hard at all  Food Insecurity: No Food Insecurity (02/10/2023)   Hunger Vital Sign    Worried About Running Out of Food in the Last Year: Never true    Ran Out of Food in the Last Year: Never true  Transportation Needs: No Transportation Needs (02/09/2023)   PRAPARE - Administrator, Civil Service (Medical): No    Lack of Transportation (Non-Medical): No  Physical Activity: Inactive (09/02/2021)   Exercise Vital Sign    Days of Exercise per Week: 0 days    Minutes of Exercise per Session: 0 min  Stress: Stress Concern Present (09/02/2021)   [employer] of Occupational Health - Occupational Stress Questionnaire    Feeling of Stress : To some extent  Social Connections: Socially Integrated (01/09/2019)   Social Connection and Isolation Panel [NHANES]    Frequency of Communication with Friends and Family: More than three times a week    Frequency of Social Gatherings with Friends and Family: More than three times a week    Attends Religious Services: More than 4 times per year    Active Member of [employer] or Organizations: Yes    Attends Engineer, structural: More than 4 times per year    Marital Status: Married     Family History: The patient's family history includes Alzheimer's disease in her father; Cancer in her cousin and mother; Diabetes in her cousin and maternal aunt; Psoriasis in her mother. There is no history of Colon cancer, Colon polyps, Esophageal cancer, Cancer - Colon, or Liver disease.  ROS:   Please see the history of present illness.    (+) peripheral edema  All other systems reviewed and are negative.  EKGs/Labs/Other Studies Reviewed:    The following  studies were reviewed today: LE Doppler 11/23/22: Left TBIs appear increased compared to prior study on 07/27/2022.    Summary:  Right: BKA.  Left: Resting left ankle-brachial index indicates noncompressible left  lower extremity arteries. The left toe-brachial index is abnormal.    LE Doppler 07/27/22:    Summary: Left: Resting left ankle-brachial index is within normal range. The left toe-brachial index is abnormal. Although ankle brachial indices are within normal limits (0.95-1.29), arterial Doppler waveforms at the ankle suggest some component of arterial occlusive disease.   ECHO 06/04/22: IMPRESSIONS    1. Left ventricular ejection fraction, by estimation, is 60 to 65%. The  left ventricle has normal function. The left ventricle has no regional  wall motion abnormalities. Left ventricular diastolic parameters are  consistent with Grade I diastolic  dysfunction (impaired relaxation).   2. Right ventricular systolic function is normal. The right ventricular  size is normal. There is mildly elevated pulmonary artery systolic  pressure. The estimated right ventricular systolic pressure is 39.4 mmHg.   3. The mitral valve is grossly normal. No evidence of mitral valve  regurgitation. No evidence of mitral stenosis.   4. The aortic valve is tricuspid. There is mild calcification of the  aortic valve. There is mild thickening of the aortic valve. Aortic valve  regurgitation is not visualized. Aortic valve sclerosis is present, with  no evidence of aortic valve stenosis.   5. The inferior  vena cava is dilated in size with >50% respiratory  variability, suggesting right atrial pressure of 8 mmHg.   Conclusion(s)/Recommendation(s): No intracardiac source of embolism  detected on this transthoracic study. Consider a transesophageal  echocardiogram to exclude cardiac source of embolism if clinically  indicated.   Bilateral Carotid Doppler 06/04/22: Summary:  Right Carotid:  Velocities in the right ICA are consistent with a 1-39%  stenosis.   Left Carotid: Velocities in the left ICA are consistent with a 1-39%  stenosis.   Vertebrals: Bilateral vertebral arteries demonstrate antegrade flow.  Subclavians: Right subclavian artery flow was disturbed. Normal flow               hemodynamics were seen in the left subclavian artery.     EKG:EKG is personally reviewed.   01/10/2023: Sinus rhythm. Rate 67 bpm. L axis deviation.   Recent Labs: 06/02/2022: TSH 2.095 06/10/2022: Magnesium 1.9 06/22/2022: ALT 21 02/09/2023: B Natriuretic Peptide 331.1 02/14/2023: BUN 29; Creatinine, Ser 5.30; Hemoglobin 9.8; Platelets 420; Potassium 3.8; Sodium 136  Recent Lipid Panel    Component Value Date/Time   CHOL 76 06/05/2022 0112   CHOL 96 (L) 04/13/2022 1526   TRIG 43 06/05/2022 0112   HDL 43 06/05/2022 0112   HDL 34 (L) 04/13/2022 1526   CHOLHDL 1.8 06/05/2022 0112   VLDL 9 06/05/2022 0112   LDLCALC 24 06/05/2022 0112   LDLCALC 39 04/13/2022 1526     Risk Assessment/Calculations:          Physical Exam:    VS:  BP (!) 168/72 (BP Location: Left Arm, Patient Position: Sitting, Cuff Size: Large)   Pulse 67   Ht 5\' 5"  (1.651 m)   Wt 182 lb (82.6 kg)   BMI 30.29 kg/m     Wt Readings from Last 3 Encounters:  02/15/23 180 lb (81.6 kg)  02/14/23 181 lb 14.1 oz (82.5 kg)  01/10/23 182 lb (82.6 kg)     VS:  BP (!) 168/72 (BP Location: Left Arm, Patient Position: Sitting, Cuff Size: Large)   Pulse 67   Ht 5\' 5"  (1.651 m)   Wt 182 lb (82.6 kg)   BMI 30.29 kg/m  , BMI Body mass index is 30.29 kg/m. GENERAL:  Well appearing HEENT: Pupils equal round and reactive, fundi not visualized, oral mucosa unremarkable NECK:  No jugular venous distention, waveform within normal limits, carotid upstroke brisk and symmetric, no bruits, no thyromegaly LYMPHATICS:  No cervical adenopathy LUNGS:  Clear to auscultation bilaterally HEART:  RRR.  PMI not displaced or  sustained,S1 and S2 within normal limits, no S3, no S4, no clicks, no rubs, no murmurs ABD:  Flat, positive bowel sounds normal in frequency in pitch, no bruits, no rebound, no guarding, no midline pulsatile mass, no hepatomegaly, no splenomegaly EXT:  2 plus pulses throughout, no edema, no cyanosis no clubbing.  R BKA SKIN:  No rashes no nodules NEURO:  Cranial nerves II through XII grossly intact, motor grossly intact throughout PSYCH:  Cognitively intact, oriented to person place and time   ASSESSMENT:    1. Essential hypertension    PLAN:    In order of problems listed above:  # Hypertension:  History is uncontrolled.  Continue carvedilol, losartan, and add hydralazine 25 mg twice daily.  She will check her blood pressures to daily and bring to follow-up.  # CVA: # PAD: # Hyperlipidemia: Status post right BKA.  She continues to follow with vascular and has a prosthesis.  Blood  pressure control as above.  Continue aspirin and statin.  Continue ticagrelor.  # ESRD on HD:  Fistula is maturing.  She is dialyzing via catheter.      Follow up with Dedria Endres C. Duke Salvia, MD, [facility] in 1 month.   Medication Adjustments/Labs and Tests Ordered: Current medicines are reviewed at length with the patient today.  Concerns regarding medicines are outlined above.  Orders Placed This Encounter  Procedures   EKG 12-Lead   Meds ordered this encounter  Medications   hydrALAZINE (APRESOLINE) 25 MG tablet    Sig: Take 1 tablet (25 mg total) by mouth in the morning and at bedtime.    Dispense:  180 tablet    Refill:  3    Patient Instructions  Medication Instructions:  START HYDRALAZINE 25 MG TWICE A DAY   Labwork: NONE  Testing/Procedures: NONE  Follow-Up: 02/16/2023 2:45 PM VIRTUAL VISIT WITH CAITLIN W NP   Any Other Special Instructions Will Be Listed Below (If Applicable). MONITOR BLOOD PRESSURE TWICE A DAY AND LOG. HAVE LOG AVAILABLE AT FOLLOW UP VISIT   If you need a refill  on your cardiac medications before your next appointment, please call your pharmacy.     I,Jessica Ford,acting as a Neurosurgeon for [employer], MD.,have documented all relevant documentation on the behalf of Chilton Si, MD,as directed by  Chilton Si, MD while in the presence of Chilton Si, MD.   I, Cedra Villalon C. Duke Salvia, MD have reviewed all documentation for this visit.  The documentation of the exam, diagnosis, procedures, and orders on 02/16/2023 are all accurate and complete.   Signed, Chilton Si, MD  02/16/2023 12:59 PM    [facility]

## 2023-01-11 DIAGNOSIS — N186 End stage renal disease: Secondary | ICD-10-CM | POA: Diagnosis not present

## 2023-01-11 DIAGNOSIS — N2581 Secondary hyperparathyroidism of renal origin: Secondary | ICD-10-CM | POA: Diagnosis not present

## 2023-01-11 DIAGNOSIS — Z992 Dependence on renal dialysis: Secondary | ICD-10-CM | POA: Diagnosis not present

## 2023-01-12 ENCOUNTER — Ambulatory Visit: Payer: Medicare PPO | Admitting: Physical Therapy

## 2023-01-12 ENCOUNTER — Encounter: Payer: Self-pay | Admitting: Physical Therapy

## 2023-01-12 DIAGNOSIS — I5032 Chronic diastolic (congestive) heart failure: Secondary | ICD-10-CM | POA: Diagnosis not present

## 2023-01-12 DIAGNOSIS — I2510 Atherosclerotic heart disease of native coronary artery without angina pectoris: Secondary | ICD-10-CM | POA: Diagnosis not present

## 2023-01-12 DIAGNOSIS — I69354 Hemiplegia and hemiparesis following cerebral infarction affecting left non-dominant side: Secondary | ICD-10-CM | POA: Diagnosis not present

## 2023-01-12 DIAGNOSIS — S88111D Complete traumatic amputation at level between knee and ankle, right lower leg, subsequent encounter: Secondary | ICD-10-CM | POA: Diagnosis not present

## 2023-01-12 DIAGNOSIS — M6281 Muscle weakness (generalized): Secondary | ICD-10-CM

## 2023-01-12 DIAGNOSIS — J9601 Acute respiratory failure with hypoxia: Secondary | ICD-10-CM | POA: Diagnosis not present

## 2023-01-12 DIAGNOSIS — R2681 Unsteadiness on feet: Secondary | ICD-10-CM

## 2023-01-12 DIAGNOSIS — I69391 Dysphagia following cerebral infarction: Secondary | ICD-10-CM | POA: Diagnosis not present

## 2023-01-12 DIAGNOSIS — R2689 Other abnormalities of gait and mobility: Secondary | ICD-10-CM

## 2023-01-12 DIAGNOSIS — N186 End stage renal disease: Secondary | ICD-10-CM | POA: Diagnosis not present

## 2023-01-12 DIAGNOSIS — J45909 Unspecified asthma, uncomplicated: Secondary | ICD-10-CM | POA: Diagnosis not present

## 2023-01-12 DIAGNOSIS — I130 Hypertensive heart and chronic kidney disease with heart failure and stage 1 through stage 4 chronic kidney disease, or unspecified chronic kidney disease: Secondary | ICD-10-CM | POA: Diagnosis not present

## 2023-01-12 NOTE — Therapy (Signed)
OUTPATIENT PHYSICAL THERAPY TREATMENT   Patient Name: Carolyn Russo MRN: [id number] DOB:11/29/1950, 72 y.o., female Today's Date: 01/12/2023  PCP: No PCP on file REFERRING PROVIDER: Meridee Score, MD  END OF SESSION:  PT End of Session - 01/12/23 1602     Visit Number 3    Number of Visits 26    Date for PT Re-Evaluation 04/04/23    Authorization Type [employer] Choice PPO    Authorization Time Period $20 co-pay    Progress Note Due on Visit 10    PT Start Time 1600    PT Stop Time 1645    PT Time Calculation (min) 45 min    Equipment Utilized During Treatment Gait belt    Activity Tolerance Patient tolerated treatment well;Patient limited by fatigue;Patient limited by pain    Behavior During Therapy WFL for tasks assessed/performed              Past Medical History:  Diagnosis Date   Acute GI bleeding    Allergy    Anemia    Anterior chest wall pain    Appendicitis 1965   Asthma    Body mass index 37.0-37.9, adult    Breast pain    Cataract    both eyes   CHF (congestive heart failure) ([place])    Chronic kidney disease    stage 5 - on dialysis   Cognitive change 04/20/2021   r/t cva [id number]   Complication of anesthesia    Memory loss after general   Dehydration 2014   Deviated septum 1971   Diabetes mellitus    Dysphagia due to old stroke    easy to get strangled when eating   Dyspnea 2014   Extrinsic asthma    WITH ASTHMA ATTACK   Fibroid 1980   GERD (gastroesophageal reflux disease)    Heart murmur    History of migraine    History of seizure    with stroke   Hx gestational diabetes    Hyperlipidemia    Hypertension 2014   Inguinal hernia 1959   Malaise and fatigue 2014   Non-IgE mediated allergic asthma 2014   Obesity    Pelvic pain    Pregnancy, high-risk 1985   Stroke ([place]) 04/20/2021   (CVA) of right basal ganglia   Tonsillitis 1968   Uterine fibroid 1980   Visual field defect    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   Past Surgical History:   Procedure Laterality Date   ABDOMINAL AORTOGRAM W/LOWER EXTREMITY N/A 02/21/2022   Procedure: ABDOMINAL AORTOGRAM W/LOWER EXTREMITY;  Surgeon: Waynetta Sandy, MD;  Location: [facility];  Service: Cardiovascular;  Laterality: N/A;   AMPUTATION Right 05/18/2022   Procedure: RIGHT BELOW KNEE AMPUTATION;  Surgeon: Newt Minion, MD;  Location: [facility];  Service: Orthopedics;  Laterality: Right;   AMPUTATION TOE Right 04/15/2022   Procedure: AMPUTATION  LST TOERIGHT FOOT;  Surgeon: Criselda Peaches, DPM;  Location: [facility];  Service: Podiatry;  Laterality: Right;   APPENDECTOMY  0000000   BASCILIC VEIN TRANSPOSITION Right 04/30/2021   Procedure: RIGHT FIRST STAGE [place];  Surgeon: Angelia Mould, MD;  Location: [facility];  Service: Vascular;  Laterality: Right;   CESAREAN SECTION  1985   COLONOSCOPY     ESOPHAGOGASTRODUODENOSCOPY (EGD) WITH PROPOFOL N/A 04/18/2021   Procedure: ESOPHAGOGASTRODUODENOSCOPY (EGD) WITH PROPOFOL;  Surgeon: Gatha Mayer, MD;  Location: [facility];  Service: Endoscopy;  Laterality: N/A;   EYE SURGERY  bilateral cataract    FLEXIBLE SIGMOIDOSCOPY N/A 04/18/2021   Procedure: FLEXIBLE SIGMOIDOSCOPY;  Surgeon: Gatha Mayer, MD;  Location: [facility];  Service: Endoscopy;  Laterality: N/A;   HERNIA REPAIR  1959   IR FLUORO GUIDE CV LINE RIGHT  03/18/2021   IR US GUIDE VASC ACCESS RIGHT  03/18/2021   LEFT HEART CATH AND CORONARY ANGIOGRAPHY N/A 04/04/2017   Procedure: Left Heart Cath and Coronary Angiography;  Surgeon: Belva Crome, MD;  Location: [facility];  Service: Cardiovascular;  Laterality: N/A;   [place], 2004, 2007   PERIPHERAL VASCULAR THROMBECTOMY  02/21/2022   Procedure: PERIPHERAL VASCULAR THROMBECTOMY;  Surgeon: Waynetta Sandy, MD;  Location: [facility];  Service: Cardiovascular;;  Right Tibial   RHINOPLASTY  1971   ROTATOR CUFF REPAIR  2003   SURGICAL REPAIR OF HEMORRHAGE  2015    TONSILLECTOMY  1968   Patient Active Problem List   Diagnosis Date Noted   Urinary tract infection without hematuria    Diabetes mellitus due to underlying condition with hypoglycemia without coma, with long-term current use of insulin (HCC)    Acute encephalopathy 06/22/2022   Acute respiratory failure ([place])    Unresponsiveness    Pressure ulcer 06/07/2022   Acute stroke due to ischemia ([place]) 06/03/2022   Chest pain 06/01/2022   History of CVA (cerebrovascular accident) 06/01/2022   Seizure ([place])    Encephalopathy    Below-knee amputation of right lower extremity ([place])    Leukocytosis    Foot infection 05/10/2022   PAD (peripheral artery disease) ([place]) 05/10/2022   Gangrene of right foot ([place]) 04/15/2022   Left hemiparesis ([place]) 09/24/2021   Abnormality of gait 09/24/2021   Pain of right hand 08/06/2021   Steal syndrome of dialysis vascular access ([place]) 08/06/2021   Subclavian steal syndrome of right subclavian artery 06/25/2021   Right hand weakness 06/25/2021   Stroke-like symptoms 05/06/2021   Bandemia    Cerebrovascular accident (CVA) of right basal ganglia ([place]) 04/20/2021   External hemorrhoids    Basal ganglia infarction ([place]) 04/16/2021   ESRD on dialysis ([place]) 04/16/2021   Hypertensive urgency 03/10/2021   Hilar enlargement    Anemia of chronic disease    Chronic diastolic CHF (congestive heart failure) ([place]) 03/09/2021   CKD (chronic kidney disease), stage V ([place]) 03/08/2021   Diabetic retinopathy ([place]) 03/08/2021   Hyperglycemia due to type 2 diabetes mellitus ([place]) 03/08/2021   Pure hypercholesterolemia 03/08/2021   Anemia in chronic kidney disease 09/24/2020   Uncontrolled type 2 diabetes mellitus with hyperglycemia ([place]) 03/19/2019   Osteopenia 09/04/2018   Migraine 09/04/2018   Asthma 09/04/2018   Pseudophakia of both eyes 07/17/2018   Pseudophakia of left eye 07/03/2018   Open angle with borderline findings and high glaucoma risk in left eye 07/03/2018    Age-related nuclear cataract of right eye 07/03/2018   CAD (coronary artery disease), native coronary artery 04/27/2017   Abnormal nuclear stress test 04/04/2017   Shortness of breath 03/09/2017   Appendicitis    Age-related hypermature cataract of both eyes 12/20/2016   Uterine leiomyoma 11/27/2012   Dyslipidemia (high LDL; low HDL) 11/25/2011   Obesity (BMI 30-39.9) 11/25/2011   Hypertensive disorder 11/21/2011   Type 2 diabetes mellitus ([place]) 11/21/2011   Abnormal EKG 11/21/2011    ONSET DATE: 12/29/2022 prosthesis delivery  REFERRING DIAG: [id number] (ICD-10-CM) - Below-knee amputation of right lower extremity   THERAPY DIAG:  Unsteadiness on feet  Other abnormalities  of gait and mobility  Muscle weakness (generalized)  Hemiplegia and hemiparesis following cerebral infarction affecting left non-dominant side (HCC)  Rationale for Evaluation and Treatment: Rehabilitation  SUBJECTIVE:   SUBJECTIVE STATEMENT: She relays some pain in her Rt leg today, her prosthethic feels like if weights 500 lbs.  PERTINENT HISTORY: CVA / left hemiparesis due to right basal ganglia infarct 7/22, right hip bursitis, anxiety, dCHF, ESRD on HD, HTN, DM, CAD  PAIN:  Are you having pain?  Yes: NPRS scale: 3/10 Pain location: bilateral knees more patella Pain description: throbbing Aggravating factors: unknown Relieving factors: Voltarin & massage  PRECAUTIONS: Fall and Other: NO BP RUE  WEIGHT BEARING RESTRICTIONS: No  FALLS: Has patient fallen in last 6 months? No  LIVING ENVIRONMENT: Lives with: lives with their spouse and CNAs 24 hrs 7 days / wk Lives in: House Home Access: Ramped entrance Home layout: Multi level, Full bath on main level, and Able to live on main level with bedroom and bathroom Stairs: Yes: Internal: 16 steps; on left going up and External: 4 steps; on right going up Has following equipment at home: Single point cane, Walker - 2 wheeled, Wheelchair (manual), and  shower chair  PLOF: Independent with household mobility with device and Independent with community mobility with device  PATIENT GOALS: walk with prosthesis, stand & transfer.   OBJECTIVE:  COGNITION: Overall cognitive status: History of cognitive impairments - at baseline  POSTURE: rounded shoulders, forward head, decreased lumbar lordosis, increased thoracic kyphosis, and flexed trunk   LOWER EXTREMITY ROM: Eval / 01/05/2023:    BLEs PROM WFL except unable to assess hip ext  (Blank rows = not tested)  LOWER EXTREMITY MMT:  MMT Right eval Left eval  Hip flexion 3-/5   Hip extension 3-/5   Hip abduction 3-/5   Hip adduction    Hip internal rotation    Hip external rotation    Knee flexion 3-/5   Knee extension 3-/5   Ankle dorsiflexion NA   Ankle plantarflexion NA   Ankle inversion NA   Ankle eversion NA   Eval / 01/05/2023: left LE & UE hemiparesis.  In gait slides to advance LLE and knee / hip instability in stance.  (Blank rows = not tested)  TRANSFERS: Sit to stand: Max A pulling BUEs on //bars from w/c Stand to sit: Min A using BUEs //bars to control descent Lateral scoot transfers: Mod A w/c to / from mat table level & adjacent   GAIT: Gait pattern: step to pattern, decreased step length- Right, decreased step length- Left, decreased stance time- Right, decreased stance time- Left, decreased hip/knee flexion- Left, decreased ankle dorsiflexion- Left, Right hip hike, Left hip hike, knee flexed in stance- Left, antalgic, lateral hip instability, trunk flexed, wide BOS, and poor foot clearance- Left Distance walked: 3' Assistive device utilized: // bars & RLE TTA prosthesis Level of assistance: Total A / 2 person Comments:  LLE with poor clearance / slides foot forward and knee / hip instability in stance.   FUNCTIONAL TESTs:  Standing in //bars for 30 sec with modA.    CURRENT PROSTHETIC WEAR ASSESSMENT: Eval / 01/05/2023:  Patient is dependent with: skin check,  residual limb care, care of non-amputated limb, prosthetic cleaning, ply sock cleaning, correct ply sock adjustment, proper wear schedule/adjustment, and proper weight-bearing schedule/adjustment Donning prosthesis: Total A / family & CNA requires 100% cueing Doffing prosthesis: Total A / family & CNA requires 100% cueing Prosthetic wear tolerance: 1-1.5 hours, 1x/day,  6 of 7 days since delivery Prosthetic weight bearing tolerance: 3 minutes during eval with c/o knee pain Edema: pitting Residual limb condition: lateral incision with 82mm X 56mm long scabs with serous drainage, arrived with bandaid over wound,  dry skin, normal temperature & color, cylindrical shape Prosthetic description: silicon liner with pin lock suspension, total contact socket, SACH foot K code/activity level with prosthetic use: Level 1    TODAY'S TREATMENT:                                                                                                                             DATE:  01/10/2023: Prosthetic Training with TTA prosthesis At sink with PT assisting for sit to stand X 4 reps using bilat UE support to pull up, first set working on lateral weight shift, 2nd set A-P weight shift, 3rd rep working on moving cones Rt and left with Rt arm, 4th rep moving cones with left arm all standing time to tolerance (10 seconds or so) Stand pivot transfers wheelchair to low mat and back with mod to max A needed Standing in // bars with bilat UE support to help pull up with max A X 2 reps, first one  stepped one step fwd and back with Rt leg and one step fwd and back left leg but this caused pain in Rt leg so 2nd rep only stepped with Rt leg fwd and back X 2 reps  Therex Seated LAQ 2 X 10 bilat Seated marches 2X10 bilat Seated core isometrics on low mat table pushing back into PT and pulling fwd against PT X 10 reps  01/05/2023  PATIENT EDUCATION: PATIENT EDUCATED ON FOLLOWING PROSTHETIC CARE: Education details: Use of  Vivewear under liner with prosthesis wear, use of shrinker at all times out of prosthesis,   Skin check, Residual limb care, Prosthetic cleaning, Correct ply sock adjustment, [place] donning, and Proper wear schedule/adjustment Prosthetic wear tolerance: 2 hours 2x/day, 7 days/week Person educated: Patient, Spouse, and Caregiver CNA Education method: Explanation, Demonstration, Tactile cues, and Verbal cues Education comprehension: verbalized understanding, verbal cues required, tactile cues required, and needs further education  HOME EXERCISE PROGRAM:  ASSESSMENT:  CLINICAL IMPRESSION: Worked on standing tolerance today and transfers but she has limited tolerance with this due to Rt leg pain and back pain noted combined with her bilat LE weakness. She will continue to benefit from skilled PT to improve her abilities with this   OBJECTIVE IMPAIRMENTS: Abnormal gait, decreased activity tolerance, decreased balance, decreased cognition, decreased coordination, decreased endurance, decreased knowledge of condition, decreased knowledge of use of DME, decreased mobility, difficulty walking, decreased ROM, decreased strength, postural dysfunction, prosthetic dependency , obesity, and pain.   ACTIVITY LIMITATIONS: standing, stairs, transfers, locomotion level, and safe prosthesis use.  PARTICIPATION LIMITATIONS: community activity, church, and household mobility  PERSONAL FACTORS: Age, Fitness, Past/current experiences, Time since onset of injury/illness/exacerbation, and 3+ comorbidities: see PMH  are also affecting patient's functional outcome.  REHAB POTENTIAL: Good  CLINICAL DECISION MAKING: Evolving/moderate complexity  EVALUATION COMPLEXITY: Moderate   GOALS: Goals reviewed with patient? Yes  SHORT TERM GOALS: Target date: 02/02/2023  Family / CNA donnes prosthesis correctly & verbalizes proper cleaning. Baseline: SEE OBJECTIVE DATA Goal status: INITIAL 2.  Patient tolerates  prosthesis >6 hrs total /day without increase in skin issues or limb / knee pain <7/10 with standing. Baseline: SEE OBJECTIVE DATA Goal status: INITIAL  3.  Patient able to stand in //bars with CGA for 60 seconds.  Baseline: SEE OBJECTIVE DATA Goal status: INITIAL  4. Patient ambulates 10' // bars & prosthesis with maxA of 1 person. Baseline: SEE OBJECTIVE DATA Goal status: INITIAL   LONG TERM GOALS: Target date: 04/04/2023  Patient & family / CNA demonstrates & verbalized understanding of prosthetic care to enable safe utilization of prosthesis. Baseline: SEE OBJECTIVE DATA Goal status: INITIAL  Patient tolerates prosthesis wear >80% of awake hours without skin or limb pain issues. Baseline: SEE OBJECTIVE DATA Goal status: INITIAL  Stand balance with walker: static 2 min with supervision to enable ability for standing ADLS. Baseline: SEE OBJECTIVE DATA Goal status: INITIAL  Patient ambulates >50' with RW & prosthesis only with family / CNA assist safely.  Baseline: SEE OBJECTIVE DATA Goal status: INITIAL  Patient transfers scooting with supervision and sit to/from stand with minA w/c to RW.  Baseline: SEE OBJECTIVE DATA Goal status: INITIAL   PLAN:  PT FREQUENCY: 2x/week  PT DURATION: 13 weeks / 90 days  PLANNED INTERVENTIONS: Therapeutic exercises, Therapeutic activity, Neuromuscular re-education, Balance training, Gait training, Patient/Family education, Self Care, Vestibular training, Prosthetic training, DME instructions, and Manual therapy  PLAN FOR NEXT SESSION: review donning & wear recommendations, work on scooting transfers and sit/stand at sink along with standing balance activities.   Debbe Odea, PT, DPT 01/12/2023, 4:54 PM

## 2023-01-13 ENCOUNTER — Telehealth: Payer: Self-pay | Admitting: Audiologist

## 2023-01-13 DIAGNOSIS — N2581 Secondary hyperparathyroidism of renal origin: Secondary | ICD-10-CM | POA: Diagnosis not present

## 2023-01-13 DIAGNOSIS — Z992 Dependence on renal dialysis: Secondary | ICD-10-CM | POA: Diagnosis not present

## 2023-01-13 DIAGNOSIS — N186 End stage renal disease: Secondary | ICD-10-CM | POA: Diagnosis not present

## 2023-01-13 NOTE — Telephone Encounter (Signed)
Ms Pilling husband (Dr. [name]) called requesting a copy of Audiology exam for 08/17/2022. States received vm that she missed her appointment and there was no report.  They are attempting to get hearing aids and need a copy states it is not available via mychart.  The HIM request information was given. Advised that Judson Roch is out of office until Monday. He states he will come in an pick up report.  States he will be in touch next week if he does not hear back .

## 2023-01-16 DIAGNOSIS — Z992 Dependence on renal dialysis: Secondary | ICD-10-CM | POA: Diagnosis not present

## 2023-01-16 DIAGNOSIS — N186 End stage renal disease: Secondary | ICD-10-CM | POA: Diagnosis not present

## 2023-01-16 DIAGNOSIS — N2581 Secondary hyperparathyroidism of renal origin: Secondary | ICD-10-CM | POA: Diagnosis not present

## 2023-01-17 ENCOUNTER — Encounter: Payer: Self-pay | Admitting: Physical Therapy

## 2023-01-17 ENCOUNTER — Ambulatory Visit: Payer: Medicare PPO | Admitting: Physical Therapy

## 2023-01-17 DIAGNOSIS — M6281 Muscle weakness (generalized): Secondary | ICD-10-CM | POA: Diagnosis not present

## 2023-01-17 DIAGNOSIS — R2689 Other abnormalities of gait and mobility: Secondary | ICD-10-CM

## 2023-01-17 DIAGNOSIS — R2681 Unsteadiness on feet: Secondary | ICD-10-CM | POA: Diagnosis not present

## 2023-01-17 DIAGNOSIS — L98498 Non-pressure chronic ulcer of skin of other sites with other specified severity: Secondary | ICD-10-CM

## 2023-01-17 DIAGNOSIS — I69354 Hemiplegia and hemiparesis following cerebral infarction affecting left non-dominant side: Secondary | ICD-10-CM | POA: Diagnosis not present

## 2023-01-17 NOTE — Therapy (Signed)
OUTPATIENT PHYSICAL THERAPY TREATMENT   Patient Name: Carolyn Russo MRN: [id number] DOB:[birth year], 72 y.o., female Today's Date: 01/17/2023  PCP: No PCP on file REFERRING PROVIDER: Meridee Score, MD  END OF SESSION:  PT End of Session - 01/17/23 1554     Visit Number 4    Number of Visits 26    Date for PT Re-Evaluation 04/04/23    Authorization Type [employer] Choice PPO    Authorization Time Period $20 co-pay    Progress Note Due on Visit 10    PT Start Time 1554    PT Stop Time 1640    PT Time Calculation (min) 46 min    Equipment Utilized During Treatment Gait belt    Activity Tolerance Patient tolerated treatment well;Patient limited by fatigue;Patient limited by pain    Behavior During Therapy WFL for tasks assessed/performed              Past Medical History:  Diagnosis Date   Acute GI bleeding    Allergy    Anemia    Anterior chest wall pain    Appendicitis 1965   Asthma    Body mass index 37.0-37.9, adult    Breast pain    Cataract    both eyes   CHF (congestive heart failure) ([place])    Chronic kidney disease    stage 5 - on dialysis   Cognitive change 04/20/2021   r/t cva [id number]   Complication of anesthesia    Memory loss after general   Dehydration 2014   Deviated septum 1971   Diabetes mellitus    Dysphagia due to old stroke    easy to get strangled when eating   Dyspnea 2014   Extrinsic asthma    WITH ASTHMA ATTACK   Fibroid 1980   GERD (gastroesophageal reflux disease)    Heart murmur    History of migraine    History of seizure    with stroke   Hx gestational diabetes    Hyperlipidemia    Hypertension 2014   Inguinal hernia 1959   Malaise and fatigue 2014   Non-IgE mediated allergic asthma 2014   Obesity    Pelvic pain    Pregnancy, high-risk 1985   Stroke ([place]) 04/20/2021   (CVA) of right basal ganglia   Tonsillitis 1968   Uterine fibroid 1980   Visual field defect    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   Past Surgical History:   Procedure Laterality Date   ABDOMINAL AORTOGRAM W/LOWER EXTREMITY N/A 02/21/2022   Procedure: ABDOMINAL AORTOGRAM W/LOWER EXTREMITY;  Surgeon: Waynetta Sandy, MD;  Location: [facility];  Service: Cardiovascular;  Laterality: N/A;   AMPUTATION Right 05/18/2022   Procedure: RIGHT BELOW KNEE AMPUTATION;  Surgeon: Newt Minion, MD;  Location: [facility];  Service: Orthopedics;  Laterality: Right;   AMPUTATION TOE Right 04/15/2022   Procedure: AMPUTATION  LST TOERIGHT FOOT;  Surgeon: Criselda Peaches, DPM;  Location: [facility];  Service: Podiatry;  Laterality: Right;   APPENDECTOMY  0000000   BASCILIC VEIN TRANSPOSITION Right 04/30/2021   Procedure: RIGHT FIRST STAGE [place];  Surgeon: Angelia Mould, MD;  Location: [facility];  Service: Vascular;  Laterality: Right;   CESAREAN SECTION  1985   COLONOSCOPY     ESOPHAGOGASTRODUODENOSCOPY (EGD) WITH PROPOFOL N/A 04/18/2021   Procedure: ESOPHAGOGASTRODUODENOSCOPY (EGD) WITH PROPOFOL;  Surgeon: Gatha Mayer, MD;  Location: [facility];  Service: Endoscopy;  Laterality: N/A;   EYE SURGERY  bilateral cataract    FLEXIBLE SIGMOIDOSCOPY N/A 04/18/2021   Procedure: FLEXIBLE SIGMOIDOSCOPY;  Surgeon: Gatha Mayer, MD;  Location: [facility];  Service: Endoscopy;  Laterality: N/A;   HERNIA REPAIR  1959   IR FLUORO GUIDE CV LINE RIGHT  03/18/2021   IR US GUIDE VASC ACCESS RIGHT  03/18/2021   LEFT HEART CATH AND CORONARY ANGIOGRAPHY N/A 04/04/2017   Procedure: Left Heart Cath and Coronary Angiography;  Surgeon: Belva Crome, MD;  Location: [facility];  Service: Cardiovascular;  Laterality: N/A;   [place], 2004, 2007   PERIPHERAL VASCULAR THROMBECTOMY  02/21/2022   Procedure: PERIPHERAL VASCULAR THROMBECTOMY;  Surgeon: Waynetta Sandy, MD;  Location: [facility];  Service: Cardiovascular;;  Right Tibial   RHINOPLASTY  1971   ROTATOR CUFF REPAIR  2003   SURGICAL REPAIR OF HEMORRHAGE  2015    TONSILLECTOMY  1968   Patient Active Problem List   Diagnosis Date Noted   Urinary tract infection without hematuria    Diabetes mellitus due to underlying condition with hypoglycemia without coma, with long-term current use of insulin (HCC)    Acute encephalopathy 06/22/2022   Acute respiratory failure ([place])    Unresponsiveness    Pressure ulcer 06/07/2022   Acute stroke due to ischemia ([place]) 06/03/2022   Chest pain 06/01/2022   History of CVA (cerebrovascular accident) 06/01/2022   Seizure ([place])    Encephalopathy    Below-knee amputation of right lower extremity ([place])    Leukocytosis    Foot infection 05/10/2022   PAD (peripheral artery disease) ([place]) 05/10/2022   Gangrene of right foot ([place]) 04/15/2022   Left hemiparesis ([place]) 09/24/2021   Abnormality of gait 09/24/2021   Pain of right hand 08/06/2021   Steal syndrome of dialysis vascular access ([place]) 08/06/2021   Subclavian steal syndrome of right subclavian artery 06/25/2021   Right hand weakness 06/25/2021   Stroke-like symptoms 05/06/2021   Bandemia    Cerebrovascular accident (CVA) of right basal ganglia ([place]) 04/20/2021   External hemorrhoids    Basal ganglia infarction ([place]) 04/16/2021   ESRD on dialysis ([place]) 04/16/2021   Hypertensive urgency 03/10/2021   Hilar enlargement    Anemia of chronic disease    Chronic diastolic CHF (congestive heart failure) ([place]) 03/09/2021   CKD (chronic kidney disease), stage V ([place]) 03/08/2021   Diabetic retinopathy ([place]) 03/08/2021   Hyperglycemia due to type 2 diabetes mellitus ([place]) 03/08/2021   Pure hypercholesterolemia 03/08/2021   Anemia in chronic kidney disease 09/24/2020   Uncontrolled type 2 diabetes mellitus with hyperglycemia ([place]) 03/19/2019   Osteopenia 09/04/2018   Migraine 09/04/2018   Asthma 09/04/2018   Pseudophakia of both eyes 07/17/2018   Pseudophakia of left eye 07/03/2018   Open angle with borderline findings and high glaucoma risk in left eye 07/03/2018    Age-related nuclear cataract of right eye 07/03/2018   CAD (coronary artery disease), native coronary artery 04/27/2017   Abnormal nuclear stress test 04/04/2017   Shortness of breath 03/09/2017   Appendicitis    Age-related hypermature cataract of both eyes 12/20/2016   Uterine leiomyoma 11/27/2012   Dyslipidemia (high LDL; low HDL) 11/25/2011   Obesity (BMI 30-39.9) 11/25/2011   Hypertensive disorder 11/21/2011   Type 2 diabetes mellitus ([place]) 11/21/2011   Abnormal EKG 11/21/2011    ONSET DATE: 12/29/2022 prosthesis delivery  REFERRING DIAG: [id number] (ICD-10-CM) - Below-knee amputation of right lower extremity   THERAPY DIAG:  Unsteadiness on feet  Other abnormalities  of gait and mobility  Muscle weakness (generalized)  Hemiplegia and hemiparesis following cerebral infarction affecting left non-dominant side (HCC)  Non-pressure chronic ulcer of skin of other sites with other specified severity [facility])  Rationale for Evaluation and Treatment: Rehabilitation  SUBJECTIVE:   SUBJECTIVE STATEMENT: She relays she wants to know how much her prosthesis weights for dialysis. She has more Rt knee pain today  PERTINENT HISTORY: CVA / left hemiparesis due to right basal ganglia infarct 7/22, right hip bursitis, anxiety, dCHF, ESRD on HD, HTN, DM, CAD  PAIN:  Are you having pain?  Yes: NPRS scale: 7/10 Pain location: bilateral knees more patella Pain description: throbbing Aggravating factors: unknown Relieving factors: Voltarin & massage  PRECAUTIONS: Fall and Other: NO BP RUE  WEIGHT BEARING RESTRICTIONS: No  FALLS: Has patient fallen in last 6 months? No  LIVING ENVIRONMENT: Lives with: lives with their spouse and CNAs 24 hrs 7 days / wk Lives in: House Home Access: Ramped entrance Home layout: Multi level, Full bath on main level, and Able to live on main level with bedroom and bathroom Stairs: Yes: Internal: 16 steps; on left going up and External: 4 steps; on right  going up Has following equipment at home: Single point cane, Walker - 2 wheeled, Wheelchair (manual), and shower chair  PLOF: Independent with household mobility with device and Independent with community mobility with device  PATIENT GOALS: walk with prosthesis, stand & transfer.   OBJECTIVE:  COGNITION: Overall cognitive status: History of cognitive impairments - at baseline  POSTURE: rounded shoulders, forward head, decreased lumbar lordosis, increased thoracic kyphosis, and flexed trunk   LOWER EXTREMITY ROM: Eval / 01/05/2023:    BLEs PROM WFL except unable to assess hip ext  (Blank rows = not tested)  LOWER EXTREMITY MMT:  MMT Right eval Left eval  Hip flexion 3-/5   Hip extension 3-/5   Hip abduction 3-/5   Hip adduction    Hip internal rotation    Hip external rotation    Knee flexion 3-/5   Knee extension 3-/5   Ankle dorsiflexion NA   Ankle plantarflexion NA   Ankle inversion NA   Ankle eversion NA   Eval / 01/05/2023: left LE & UE hemiparesis.  In gait slides to advance LLE and knee / hip instability in stance.  (Blank rows = not tested)  TRANSFERS: Sit to stand: Max A pulling BUEs on //bars from w/c Stand to sit: Min A using BUEs //bars to control descent Lateral scoot transfers: Mod A w/c to / from mat table level & adjacent   GAIT: Gait pattern: step to pattern, decreased step length- Right, decreased step length- Left, decreased stance time- Right, decreased stance time- Left, decreased hip/knee flexion- Left, decreased ankle dorsiflexion- Left, Right hip hike, Left hip hike, knee flexed in stance- Left, antalgic, lateral hip instability, trunk flexed, wide BOS, and poor foot clearance- Left Distance walked: 3' Assistive device utilized: // bars & RLE TTA prosthesis Level of assistance: Total A / 2 person Comments:  LLE with poor clearance / slides foot forward and knee / hip instability in stance.   FUNCTIONAL TESTs:  Standing in //bars for 30 sec with  modA.    CURRENT PROSTHETIC WEAR ASSESSMENT: Eval / 01/05/2023:  Patient is dependent with: skin check, residual limb care, care of non-amputated limb, prosthetic cleaning, ply sock cleaning, correct ply sock adjustment, proper wear schedule/adjustment, and proper weight-bearing schedule/adjustment Donning prosthesis: Total A / family & CNA requires 100% cueing  Doffing prosthesis: Total A / family & CNA requires 100% cueing Prosthetic wear tolerance: 1-1.5 hours, 1x/day, 6 of 7 days since delivery Prosthetic weight bearing tolerance: 3 minutes during eval with c/o knee pain Edema: pitting Residual limb condition: lateral incision with 73mm X 20mm long scabs with serous drainage, arrived with bandaid over wound,  dry skin, normal temperature & color, cylindrical shape Prosthetic description: silicon liner with pin lock suspension, total contact socket, SACH foot K code/activity level with prosthetic use: Level 1    TODAY'S TREATMENT:                                                                                                                             DATE:  01/17/2023: Prosthetic Training with TTA prosthesis At sink with PT max assisting for sit to stand X 3 reps using bilat UE support to pull up, Standing tolerance was more limited today due to complaints of Rt knee pain and unable to stand long enough for any exercises Standing in // bars with bilat UE support to help pull up with max A X 3 reps, first one she immediately insisted she needed to sit back down, 2nd rep she stepped one small step fwd and back with Lt leg (increased Rt knee pain and needed to sit). 3rd rep she took small step fwd/back 3 times moving Rt leg only.  PT weighed prosthesis for her weighing 2 Kg.   Therex Seated LAQ  X 10 bilat Seated marches 2X10 bilat Seated hip add ball squeeze 5 sec X 20 Seated hip abd with blue band 2 X 15 Seated back extension isometrics with ball behind her in wheelchair 5 sec X  20   01/10/2023: Prosthetic Training with TTA prosthesis At sink with PT assisting for sit to stand X 4 reps using bilat UE support to pull up, first set working on lateral weight shift, 2nd set A-P weight shift, 3rd rep working on moving cones Rt and left with Rt arm, 4th rep moving cones with left arm all standing time to tolerance (10 seconds or so) Stand pivot transfers wheelchair to low mat and back with mod to max A needed Standing in // bars with bilat UE support to help pull up with max A X 2 reps, first one  stepped one step fwd and back with Rt leg and one step fwd and back left leg but this caused pain in Rt leg so 2nd rep only stepped with Rt leg fwd and back X 2 reps  Therex Seated LAQ 2 X 10 bilat Seated marches 2X10 bilat Seated core isometrics on low mat table pushing back into PT and pulling fwd against PT X 10 reps  01/05/2023  PATIENT EDUCATION: PATIENT EDUCATED ON FOLLOWING PROSTHETIC CARE: Education details: Use of Vivewear under liner with prosthesis wear, use of shrinker at all times out of prosthesis,   Skin check, Residual limb care, Prosthetic cleaning, Correct ply sock adjustment, [place] donning,  and Proper wear schedule/adjustment Prosthetic wear tolerance: 2 hours 2x/day, 7 days/week Person educated: Patient, Spouse, and Caregiver CNA Education method: Explanation, Demonstration, Tactile cues, and Verbal cues Education comprehension: verbalized understanding, verbal cues required, tactile cues required, and needs further education  HOME EXERCISE PROGRAM:  ASSESSMENT:  CLINICAL IMPRESSION: Her standing tolerance was more limited today due to bilateral knee pain complaints and when prompted to try to stay standing to perform exercises she would instead insist she needs to sit back down due to knee pain. I did weigh her prosthesis for her so dialysis knows about this.  She will continue to benefit from skilled PT and we will attempt to progress her standing  activity if she can tolerate it.   OBJECTIVE IMPAIRMENTS: Abnormal gait, decreased activity tolerance, decreased balance, decreased cognition, decreased coordination, decreased endurance, decreased knowledge of condition, decreased knowledge of use of DME, decreased mobility, difficulty walking, decreased ROM, decreased strength, postural dysfunction, prosthetic dependency , obesity, and pain.   ACTIVITY LIMITATIONS: standing, stairs, transfers, locomotion level, and safe prosthesis use.  PARTICIPATION LIMITATIONS: community activity, church, and household mobility  PERSONAL FACTORS: Age, Fitness, Past/current experiences, Time since onset of injury/illness/exacerbation, and 3+ comorbidities: see PMH  are also affecting patient's functional outcome.   REHAB POTENTIAL: Good  CLINICAL DECISION MAKING: Evolving/moderate complexity  EVALUATION COMPLEXITY: Moderate   GOALS: Goals reviewed with patient? Yes  SHORT TERM GOALS: Target date: 02/02/2023  Family / CNA donnes prosthesis correctly & verbalizes proper cleaning. Baseline: SEE OBJECTIVE DATA Goal status: INITIAL 2.  Patient tolerates prosthesis >6 hrs total /day without increase in skin issues or limb / knee pain <7/10 with standing. Baseline: SEE OBJECTIVE DATA Goal status: INITIAL  3.  Patient able to stand in //bars with CGA for 60 seconds.  Baseline: SEE OBJECTIVE DATA Goal status: INITIAL  4. Patient ambulates 10' // bars & prosthesis with maxA of 1 person. Baseline: SEE OBJECTIVE DATA Goal status: INITIAL   LONG TERM GOALS: Target date: 04/04/2023  Patient & family / CNA demonstrates & verbalized understanding of prosthetic care to enable safe utilization of prosthesis. Baseline: SEE OBJECTIVE DATA Goal status: INITIAL  Patient tolerates prosthesis wear >80% of awake hours without skin or limb pain issues. Baseline: SEE OBJECTIVE DATA Goal status: INITIAL  Stand balance with walker: static 2 min with supervision  to enable ability for standing ADLS. Baseline: SEE OBJECTIVE DATA Goal status: INITIAL  Patient ambulates >50' with RW & prosthesis only with family / CNA assist safely.  Baseline: SEE OBJECTIVE DATA Goal status: INITIAL  Patient transfers scooting with supervision and sit to/from stand with minA w/c to RW.  Baseline: SEE OBJECTIVE DATA Goal status: INITIAL   PLAN:  PT FREQUENCY: 2x/week  PT DURATION: 13 weeks / 90 days  PLANNED INTERVENTIONS: Therapeutic exercises, Therapeutic activity, Neuromuscular re-education, Balance training, Gait training, Patient/Family education, Self Care, Vestibular training, Prosthetic training, DME instructions, and Manual therapy  PLAN FOR NEXT SESSION: progress standing tolerance as able, try sitting core balance work on barstool in bars  Debbe Odea, PT, DPT 01/17/2023, 4:49 PM

## 2023-01-18 DIAGNOSIS — Z992 Dependence on renal dialysis: Secondary | ICD-10-CM | POA: Diagnosis not present

## 2023-01-18 DIAGNOSIS — N2581 Secondary hyperparathyroidism of renal origin: Secondary | ICD-10-CM | POA: Diagnosis not present

## 2023-01-18 DIAGNOSIS — N186 End stage renal disease: Secondary | ICD-10-CM | POA: Diagnosis not present

## 2023-01-19 ENCOUNTER — Ambulatory Visit (INDEPENDENT_AMBULATORY_CARE_PROVIDER_SITE_OTHER): Payer: Medicare PPO | Admitting: Physical Therapy

## 2023-01-19 DIAGNOSIS — L98498 Non-pressure chronic ulcer of skin of other sites with other specified severity: Secondary | ICD-10-CM | POA: Diagnosis not present

## 2023-01-19 DIAGNOSIS — R2689 Other abnormalities of gait and mobility: Secondary | ICD-10-CM | POA: Diagnosis not present

## 2023-01-19 DIAGNOSIS — K5900 Constipation, unspecified: Secondary | ICD-10-CM | POA: Diagnosis not present

## 2023-01-19 DIAGNOSIS — R2681 Unsteadiness on feet: Secondary | ICD-10-CM

## 2023-01-19 DIAGNOSIS — I69354 Hemiplegia and hemiparesis following cerebral infarction affecting left non-dominant side: Secondary | ICD-10-CM

## 2023-01-19 DIAGNOSIS — E785 Hyperlipidemia, unspecified: Secondary | ICD-10-CM | POA: Diagnosis not present

## 2023-01-19 DIAGNOSIS — M6281 Muscle weakness (generalized): Secondary | ICD-10-CM

## 2023-01-19 DIAGNOSIS — N186 End stage renal disease: Secondary | ICD-10-CM | POA: Diagnosis not present

## 2023-01-19 DIAGNOSIS — I5032 Chronic diastolic (congestive) heart failure: Secondary | ICD-10-CM | POA: Diagnosis not present

## 2023-01-19 DIAGNOSIS — K219 Gastro-esophageal reflux disease without esophagitis: Secondary | ICD-10-CM | POA: Diagnosis not present

## 2023-01-20 DIAGNOSIS — N2581 Secondary hyperparathyroidism of renal origin: Secondary | ICD-10-CM | POA: Diagnosis not present

## 2023-01-20 DIAGNOSIS — N186 End stage renal disease: Secondary | ICD-10-CM | POA: Diagnosis not present

## 2023-01-20 DIAGNOSIS — M6281 Muscle weakness (generalized): Secondary | ICD-10-CM | POA: Diagnosis not present

## 2023-01-20 DIAGNOSIS — I2510 Atherosclerotic heart disease of native coronary artery without angina pectoris: Secondary | ICD-10-CM | POA: Diagnosis not present

## 2023-01-20 DIAGNOSIS — S88111S Complete traumatic amputation at level between knee and ankle, right lower leg, sequela: Secondary | ICD-10-CM | POA: Diagnosis not present

## 2023-01-20 DIAGNOSIS — I5032 Chronic diastolic (congestive) heart failure: Secondary | ICD-10-CM | POA: Diagnosis not present

## 2023-01-20 DIAGNOSIS — Z992 Dependence on renal dialysis: Secondary | ICD-10-CM | POA: Diagnosis not present

## 2023-01-22 ENCOUNTER — Encounter: Payer: Self-pay | Admitting: Physical Therapy

## 2023-01-22 DIAGNOSIS — E1122 Type 2 diabetes mellitus with diabetic chronic kidney disease: Secondary | ICD-10-CM | POA: Diagnosis not present

## 2023-01-22 DIAGNOSIS — Z992 Dependence on renal dialysis: Secondary | ICD-10-CM | POA: Diagnosis not present

## 2023-01-22 DIAGNOSIS — N186 End stage renal disease: Secondary | ICD-10-CM | POA: Diagnosis not present

## 2023-01-22 NOTE — Therapy (Signed)
OUTPATIENT PHYSICAL THERAPY TREATMENT   Patient Name: Carolyn Russo MRN: [id number] DOB:Dec 15, 1950, 72 y.o., female Today's Date: 01/22/2023  PCP: No PCP on file REFERRING PROVIDER: Meridee Score, MD  END OF SESSION:  PT End of Session - 01/22/23 2216     Visit Number 5    Number of Visits 26    Date for PT Re-Evaluation 04/04/23    Authorization Type [employer] Choice PPO    Authorization Time Period $20 co-pay    Progress Note Due on Visit 10    PT Start Time 1600    PT Stop Time 1640    PT Time Calculation (min) 40 min    Equipment Utilized During Treatment Gait belt    Activity Tolerance Patient tolerated treatment well;Patient limited by fatigue;Patient limited by pain    Behavior During Therapy WFL for tasks assessed/performed              Past Medical History:  Diagnosis Date   Acute GI bleeding    Allergy    Anemia    Anterior chest wall pain    Appendicitis 1965   Asthma    Body mass index 37.0-37.9, adult    Breast pain    Cataract    both eyes   CHF (congestive heart failure) ([place])    Chronic kidney disease    stage 5 - on dialysis   Cognitive change 04/20/2021   r/t cva [id number]   Complication of anesthesia    Memory loss after general   Dehydration 2014   Deviated septum 1971   Diabetes mellitus    Dysphagia due to old stroke    easy to get strangled when eating   Dyspnea 2014   Extrinsic asthma    WITH ASTHMA ATTACK   Fibroid 1980   GERD (gastroesophageal reflux disease)    Heart murmur    History of migraine    History of seizure    with stroke   Hx gestational diabetes    Hyperlipidemia    Hypertension 2014   Inguinal hernia 1959   Malaise and fatigue 2014   Non-IgE mediated allergic asthma 2014   Obesity    Pelvic pain    Pregnancy, high-risk 1985   Stroke ([place]) 04/20/2021   (CVA) of right basal ganglia   Tonsillitis 1968   Uterine fibroid 1980   Visual field defect    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   Past Surgical History:   Procedure Laterality Date   ABDOMINAL AORTOGRAM W/LOWER EXTREMITY N/A 02/21/2022   Procedure: ABDOMINAL AORTOGRAM W/LOWER EXTREMITY;  Surgeon: Waynetta Sandy, MD;  Location: [place] [facility];  Service: Cardiovascular;  Laterality: N/A;   AMPUTATION Right 05/18/2022   Procedure: RIGHT BELOW KNEE AMPUTATION;  Surgeon: Newt Minion, MD;  Location: [place];  Service: Orthopedics;  Laterality: Right;   AMPUTATION TOE Right 04/15/2022   Procedure: AMPUTATION  LST TOERIGHT FOOT;  Surgeon: Criselda Peaches, DPM;  Location: [facility];  Service: Podiatry;  Laterality: Right;   APPENDECTOMY  0000000   BASCILIC VEIN TRANSPOSITION Right 04/30/2021   Procedure: RIGHT FIRST STAGE [place];  Surgeon: Angelia Mould, MD;  Location: [place];  Service: Vascular;  Laterality: Right;   CESAREAN SECTION  1985   COLONOSCOPY     ESOPHAGOGASTRODUODENOSCOPY (EGD) WITH PROPOFOL N/A 04/18/2021   Procedure: ESOPHAGOGASTRODUODENOSCOPY (EGD) WITH PROPOFOL;  Surgeon: Gatha Mayer, MD;  Location: [place];  Service: Endoscopy;  Laterality: N/A;   EYE SURGERY  bilateral cataract    FLEXIBLE SIGMOIDOSCOPY N/A 04/18/2021   Procedure: FLEXIBLE SIGMOIDOSCOPY;  Surgeon: Gatha Mayer, MD;  Location: [facility];  Service: Endoscopy;  Laterality: N/A;   HERNIA REPAIR  1959   IR FLUORO GUIDE CV LINE RIGHT  03/18/2021   IR US GUIDE VASC ACCESS RIGHT  03/18/2021   LEFT HEART CATH AND CORONARY ANGIOGRAPHY N/A 04/04/2017   Procedure: Left Heart Cath and Coronary Angiography;  Surgeon: Belva Crome, MD;  Location: [place] [facility];  Service: Cardiovascular;  Laterality: N/A;   [place], 2004, 2007   PERIPHERAL VASCULAR THROMBECTOMY  02/21/2022   Procedure: PERIPHERAL VASCULAR THROMBECTOMY;  Surgeon: Waynetta Sandy, MD;  Location: [place] [facility];  Service: Cardiovascular;;  Right Tibial   RHINOPLASTY  1971   ROTATOR CUFF REPAIR  2003   SURGICAL REPAIR OF HEMORRHAGE  2015    TONSILLECTOMY  1968   Patient Active Problem List   Diagnosis Date Noted   Urinary tract infection without hematuria    Diabetes mellitus due to underlying condition with hypoglycemia without coma, with long-term current use of insulin (HCC)    Acute encephalopathy 06/22/2022   Acute respiratory failure ([place])    Unresponsiveness    Pressure ulcer 06/07/2022   Acute stroke due to ischemia ([place]) 06/03/2022   Chest pain 06/01/2022   History of CVA (cerebrovascular accident) 06/01/2022   Seizure ([place])    Encephalopathy    Below-knee amputation of right lower extremity ([place])    Leukocytosis    Foot infection 05/10/2022   PAD (peripheral artery disease) ([place]) 05/10/2022   Gangrene of right foot ([place]) 04/15/2022   Left hemiparesis ([place]) 09/24/2021   Abnormality of gait 09/24/2021   Pain of right hand 08/06/2021   Steal syndrome of dialysis vascular access ([place]) 08/06/2021   Subclavian steal syndrome of right subclavian artery 06/25/2021   Right hand weakness 06/25/2021   Stroke-like symptoms 05/06/2021   Bandemia    Cerebrovascular accident (CVA) of right basal ganglia ([place]) 04/20/2021   External hemorrhoids    Basal ganglia infarction ([place]) 04/16/2021   ESRD on dialysis ([place]) 04/16/2021   Hypertensive urgency 03/10/2021   Hilar enlargement    Anemia of chronic disease    Chronic diastolic CHF (congestive heart failure) ([place]) 03/09/2021   CKD (chronic kidney disease), stage V ([place]) 03/08/2021   Diabetic retinopathy ([place]) 03/08/2021   Hyperglycemia due to type 2 diabetes mellitus ([place]) 03/08/2021   Pure hypercholesterolemia 03/08/2021   Anemia in chronic kidney disease 09/24/2020   Uncontrolled type 2 diabetes mellitus with hyperglycemia ([place]) 03/19/2019   Osteopenia 09/04/2018   Migraine 09/04/2018   Asthma 09/04/2018   Pseudophakia of both eyes 07/17/2018   Pseudophakia of left eye 07/03/2018   Open angle with borderline findings and high glaucoma risk in left eye 07/03/2018    Age-related nuclear cataract of right eye 07/03/2018   CAD (coronary artery disease), native coronary artery 04/27/2017   Abnormal nuclear stress test 04/04/2017   Shortness of breath 03/09/2017   Appendicitis    Age-related hypermature cataract of both eyes 12/20/2016   Uterine leiomyoma 11/27/2012   Dyslipidemia (high LDL; low HDL) 11/25/2011   Obesity (BMI 30-39.9) 11/25/2011   Hypertensive disorder 11/21/2011   Type 2 diabetes mellitus ([place]) 11/21/2011   Abnormal EKG 11/21/2011    ONSET DATE: 12/29/2022 prosthesis delivery  REFERRING DIAG: [id number] (ICD-10-CM) - Below-knee amputation of right lower extremity   THERAPY DIAG:  Unsteadiness on feet  Other abnormalities  of gait and mobility  Muscle weakness (generalized)  Hemiplegia and hemiparesis following cerebral infarction affecting left non-dominant side (HCC)  Non-pressure chronic ulcer of skin of other sites with other specified severity [facility])  Rationale for Evaluation and Treatment: Rehabilitation  SUBJECTIVE:   SUBJECTIVE STATEMENT: She doesn't have any new complaints today. She does still complain of bilateral knee pain upon standing  PERTINENT HISTORY: CVA / left hemiparesis due to right basal ganglia infarct 7/22, right hip bursitis, anxiety, dCHF, ESRD on HD, HTN, DM, CAD  PAIN:  Are you having pain?  Yes: NPRS scale: 7/10 Pain location: bilateral knees more patella Pain description: throbbing Aggravating factors: unknown Relieving factors: Voltarin & massage  PRECAUTIONS: Fall and Other: NO BP RUE  WEIGHT BEARING RESTRICTIONS: No  FALLS: Has patient fallen in last 6 months? No  LIVING ENVIRONMENT: Lives with: lives with their spouse and CNAs 24 hrs 7 days / wk Lives in: House Home Access: Ramped entrance Home layout: Multi level, Full bath on main level, and Able to live on main level with bedroom and bathroom Stairs: Yes: Internal: 16 steps; on left going up and External: 4 steps; on right  going up Has following equipment at home: Single point cane, Walker - 2 wheeled, Wheelchair (manual), and shower chair  PLOF: Independent with household mobility with device and Independent with community mobility with device  PATIENT GOALS: walk with prosthesis, stand & transfer.   OBJECTIVE:  COGNITION: Overall cognitive status: History of cognitive impairments - at baseline  POSTURE: rounded shoulders, forward head, decreased lumbar lordosis, increased thoracic kyphosis, and flexed trunk   LOWER EXTREMITY ROM: Eval / 01/05/2023:    BLEs PROM WFL except unable to assess hip ext  (Blank rows = not tested)  LOWER EXTREMITY MMT:  MMT Right eval Left eval  Hip flexion 3-/5   Hip extension 3-/5   Hip abduction 3-/5   Hip adduction    Hip internal rotation    Hip external rotation    Knee flexion 3-/5   Knee extension 3-/5   Ankle dorsiflexion NA   Ankle plantarflexion NA   Ankle inversion NA   Ankle eversion NA   Eval / 01/05/2023: left LE & UE hemiparesis.  In gait slides to advance LLE and knee / hip instability in stance.  (Blank rows = not tested)  TRANSFERS: Sit to stand: Max A pulling BUEs on //bars from w/c Stand to sit: Min A using BUEs //bars to control descent Lateral scoot transfers: Mod A w/c to / from mat table level & adjacent   GAIT: Gait pattern: step to pattern, decreased step length- Right, decreased step length- Left, decreased stance time- Right, decreased stance time- Left, decreased hip/knee flexion- Left, decreased ankle dorsiflexion- Left, Right hip hike, Left hip hike, knee flexed in stance- Left, antalgic, lateral hip instability, trunk flexed, wide BOS, and poor foot clearance- Left Distance walked: 3' Assistive device utilized: // bars & RLE TTA prosthesis Level of assistance: Total A / 2 person Comments:  LLE with poor clearance / slides foot forward and knee / hip instability in stance.   FUNCTIONAL TESTs:  Standing in //bars for 30 sec with  modA.    CURRENT PROSTHETIC WEAR ASSESSMENT: Eval / 01/05/2023:  Patient is dependent with: skin check, residual limb care, care of non-amputated limb, prosthetic cleaning, ply sock cleaning, correct ply sock adjustment, proper wear schedule/adjustment, and proper weight-bearing schedule/adjustment Donning prosthesis: Total A / family & CNA requires 100% cueing Doffing prosthesis: Total  A / family & CNA requires 100% cueing Prosthetic wear tolerance: 1-1.5 hours, 1x/day, 6 of 7 days since delivery Prosthetic weight bearing tolerance: 3 minutes during eval with c/o knee pain Edema: pitting Residual limb condition: lateral incision with 56mm X 30mm long scabs with serous drainage, arrived with bandaid over wound,  dry skin, normal temperature & color, cylindrical shape Prosthetic description: silicon liner with pin lock suspension, total contact socket, SACH foot K code/activity level with prosthetic use: Level 1    TODAY'S TREATMENT:                                                                                                                             DATE:  01/17/2023: Prosthetic Training with TTA prosthesis Sit to stand in // bars with bilat UE support to help pull up with max A X 5 reps total  Gait in //bars with bilat UE support of bars and PT providing mod to max A to block knees and help with weight shift to advance opposite leg. We had home nurse provide wheelchair follow and she was able to walk 8 feet to end of bars X 3 reps with max encouragement each time and provided long seated rest break in between due to fatigue.  Therex Seated LAQ  X 10 bilat 1# ankle weights Seated marches 2X10 bilat 1# ankle weights Seated hip add ball squeeze 5 sec X 20 Seated hip abd with blue band 2 X 15 Seated back extension isometrics with ball behind her in wheelchair 5 sec X 20  01/17/2023: Prosthetic Training with TTA prosthesis At sink with PT max assisting for sit to stand X 3 reps using  bilat UE support to pull up, Standing tolerance was more limited today due to complaints of Rt knee pain and unable to stand long enough for any exercises Standing in // bars with bilat UE support to help pull up with max A X 3 reps, first one she immediately insisted she needed to sit back down, 2nd rep she stepped one small step fwd and back with Lt leg (increased Rt knee pain and needed to sit). 3rd rep she took small step fwd/back 3 times moving Rt leg only.  PT weighed prosthesis for her weighing 2 Kg.   Therex Seated LAQ  X 10 bilat Seated marches 2X10 bilat Seated hip add ball squeeze 5 sec X 20 Seated hip abd with blue band 2 X 15 Seated back extension isometrics with ball behind her in wheelchair 5 sec X 20   01/10/2023: Prosthetic Training with TTA prosthesis At sink with PT assisting for sit to stand X 4 reps using bilat UE support to pull up, first set working on lateral weight shift, 2nd set A-P weight shift, 3rd rep working on moving cones Rt and left with Rt arm, 4th rep moving cones with left arm all standing time to tolerance (10 seconds or so) Stand pivot transfers wheelchair to low mat and  back with mod to max A needed Standing in // bars with bilat UE support to help pull up with max A X 2 reps, first one  stepped one step fwd and back with Rt leg and one step fwd and back left leg but this caused pain in Rt leg so 2nd rep only stepped with Rt leg fwd and back X 2 reps  Therex Seated LAQ 2 X 10 bilat Seated marches 2X10 bilat Seated core isometrics on low mat table pushing back into PT and pulling fwd against PT X 10 reps  01/05/2023  PATIENT EDUCATION: PATIENT EDUCATED ON FOLLOWING PROSTHETIC CARE: Education details: Use of Vivewear under liner with prosthesis wear, use of shrinker at all times out of prosthesis,   Skin check, Residual limb care, Prosthetic cleaning, Correct ply sock adjustment, [place] donning, and Proper wear schedule/adjustment Prosthetic wear  tolerance: 2 hours 2x/day, 7 days/week Person educated: Patient, Spouse, and Caregiver CNA Education method: Explanation, Demonstration, Tactile cues, and Verbal cues Education comprehension: verbalized understanding, verbal cues required, tactile cues required, and needs further education  HOME EXERCISE PROGRAM:  ASSESSMENT:  CLINICAL IMPRESSION: She continues to complain of bilateral knee pain upon standing but after max encouragement she was able to ambulate in bars using max A from PT and bilat UE support from bars with 2nd person for wheelchair follow. This is a progression from her non-ambulatory activity up to this point and we will continue to work to progress her as tolerated.   OBJECTIVE IMPAIRMENTS: Abnormal gait, decreased activity tolerance, decreased balance, decreased cognition, decreased coordination, decreased endurance, decreased knowledge of condition, decreased knowledge of use of DME, decreased mobility, difficulty walking, decreased ROM, decreased strength, postural dysfunction, prosthetic dependency , obesity, and pain.   ACTIVITY LIMITATIONS: standing, stairs, transfers, locomotion level, and safe prosthesis use.  PARTICIPATION LIMITATIONS: community activity, church, and household mobility  PERSONAL FACTORS: Age, Fitness, Past/current experiences, Time since onset of injury/illness/exacerbation, and 3+ comorbidities: see PMH  are also affecting patient's functional outcome.   REHAB POTENTIAL: Good  CLINICAL DECISION MAKING: Evolving/moderate complexity  EVALUATION COMPLEXITY: Moderate   GOALS: Goals reviewed with patient? Yes  SHORT TERM GOALS: Target date: 02/02/2023  Family / CNA donnes prosthesis correctly & verbalizes proper cleaning. Baseline: SEE OBJECTIVE DATA Goal status: INITIAL 2.  Patient tolerates prosthesis >6 hrs total /day without increase in skin issues or limb / knee pain <7/10 with standing. Baseline: SEE OBJECTIVE DATA Goal status:  INITIAL  3.  Patient able to stand in //bars with CGA for 60 seconds.  Baseline: SEE OBJECTIVE DATA Goal status: INITIAL  4. Patient ambulates 10' // bars & prosthesis with maxA of 1 person. Baseline: SEE OBJECTIVE DATA Goal status: INITIAL   LONG TERM GOALS: Target date: 04/04/2023  Patient & family / CNA demonstrates & verbalized understanding of prosthetic care to enable safe utilization of prosthesis. Baseline: SEE OBJECTIVE DATA Goal status: INITIAL  Patient tolerates prosthesis wear >80% of awake hours without skin or limb pain issues. Baseline: SEE OBJECTIVE DATA Goal status: INITIAL  Stand balance with walker: static 2 min with supervision to enable ability for standing ADLS. Baseline: SEE OBJECTIVE DATA Goal status: INITIAL  Patient ambulates >50' with RW & prosthesis only with family / CNA assist safely.  Baseline: SEE OBJECTIVE DATA Goal status: INITIAL  Patient transfers scooting with supervision and sit to/from stand with minA w/c to RW.  Baseline: SEE OBJECTIVE DATA Goal status: INITIAL   PLAN:  PT FREQUENCY: 2x/week  PT DURATION:  13 weeks / 90 days  PLANNED INTERVENTIONS: Therapeutic exercises, Therapeutic activity, Neuromuscular re-education, Balance training, Gait training, Patient/Family education, Self Care, Vestibular training, Prosthetic training, DME instructions, and Manual therapy  PLAN FOR NEXT SESSION: progress standing tolerance as able, try sitting core balance work on barstool in bars  Debbe Odea, PT, DPT 01/22/2023, 10:17 PM

## 2023-01-24 ENCOUNTER — Encounter: Payer: Medicare PPO | Admitting: Physical Therapy

## 2023-01-26 ENCOUNTER — Encounter: Payer: Self-pay | Admitting: Physical Therapy

## 2023-01-26 ENCOUNTER — Ambulatory Visit: Payer: Medicare PPO | Admitting: Physical Therapy

## 2023-01-26 DIAGNOSIS — L98498 Non-pressure chronic ulcer of skin of other sites with other specified severity: Secondary | ICD-10-CM

## 2023-01-26 DIAGNOSIS — R2681 Unsteadiness on feet: Secondary | ICD-10-CM | POA: Diagnosis not present

## 2023-01-26 DIAGNOSIS — M25561 Pain in right knee: Secondary | ICD-10-CM

## 2023-01-26 DIAGNOSIS — I69354 Hemiplegia and hemiparesis following cerebral infarction affecting left non-dominant side: Secondary | ICD-10-CM

## 2023-01-26 DIAGNOSIS — R2689 Other abnormalities of gait and mobility: Secondary | ICD-10-CM | POA: Diagnosis not present

## 2023-01-26 DIAGNOSIS — M6281 Muscle weakness (generalized): Secondary | ICD-10-CM

## 2023-01-26 DIAGNOSIS — G8929 Other chronic pain: Secondary | ICD-10-CM

## 2023-01-26 DIAGNOSIS — M25562 Pain in left knee: Secondary | ICD-10-CM

## 2023-01-26 NOTE — Therapy (Signed)
OUTPATIENT PHYSICAL THERAPY TREATMENT   Patient Name: Carolyn Russo MRN: [id number] DOB:06-16-1951, 72 y.o., female Today's Date: 01/26/2023  PCP: No PCP on file REFERRING PROVIDER: Meridee Score, MD  END OF SESSION:  PT End of Session - 01/26/23 1600     Visit Number 6    Number of Visits 26    Date for PT Re-Evaluation 04/04/23    Authorization Type [employer] Choice PPO    Authorization Time Period $20 co-pay    Progress Note Due on Visit 10    PT Start Time 1600    PT Stop Time 1640    PT Time Calculation (min) 40 min    Equipment Utilized During Treatment Gait belt    Activity Tolerance Patient tolerated treatment well;Patient limited by fatigue;Patient limited by pain    Behavior During Therapy WFL for tasks assessed/performed              Past Medical History:  Diagnosis Date   Acute GI bleeding    Allergy    Anemia    Anterior chest wall pain    Appendicitis 1965   Asthma    Body mass index 37.0-37.9, adult    Breast pain    Cataract    both eyes   CHF (congestive heart failure)    Chronic kidney disease    stage 5 - on dialysis   Cognitive change 04/20/2021   r/t cva [id number]   Complication of anesthesia    Memory loss after general   Dehydration 2014   Deviated septum 1971   Diabetes mellitus    Dysphagia due to old stroke    easy to get strangled when eating   Dyspnea 2014   Extrinsic asthma    WITH ASTHMA ATTACK   Fibroid 1980   GERD (gastroesophageal reflux disease)    Heart murmur    History of migraine    History of seizure    with stroke   Hx gestational diabetes    Hyperlipidemia    Hypertension 2014   Inguinal hernia 1959   Malaise and fatigue 2014   Non-IgE mediated allergic asthma 2014   Obesity    Pelvic pain    Pregnancy, high-risk 1985   Stroke 04/20/2021   (CVA) of right basal ganglia   Tonsillitis 1968   Uterine fibroid 1980   Visual field defect    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   Past Surgical History:  Procedure  Laterality Date   ABDOMINAL AORTOGRAM W/LOWER EXTREMITY N/A 02/21/2022   Procedure: ABDOMINAL AORTOGRAM W/LOWER EXTREMITY;  Surgeon: Waynetta Sandy, MD;  Location: [place] [facility];  Service: Cardiovascular;  Laterality: N/A;   AMPUTATION Right 05/18/2022   Procedure: RIGHT BELOW KNEE AMPUTATION;  Surgeon: Newt Minion, MD;  Location: [place];  Service: Orthopedics;  Laterality: Right;   AMPUTATION TOE Right 04/15/2022   Procedure: AMPUTATION  LST TOERIGHT FOOT;  Surgeon: Criselda Peaches, DPM;  Location: [facility];  Service: Podiatry;  Laterality: Right;   APPENDECTOMY  0000000   BASCILIC VEIN TRANSPOSITION Right 04/30/2021   Procedure: RIGHT FIRST STAGE [place];  Surgeon: Angelia Mould, MD;  Location: [place] [facility];  Service: Vascular;  Laterality: Right;   CESAREAN SECTION  1985   COLONOSCOPY     ESOPHAGOGASTRODUODENOSCOPY (EGD) WITH PROPOFOL N/A 04/18/2021   Procedure: ESOPHAGOGASTRODUODENOSCOPY (EGD) WITH PROPOFOL;  Surgeon: Gatha Mayer, MD;  Location: [place];  Service: Endoscopy;  Laterality: N/A;   EYE SURGERY  bilateral cataract    FLEXIBLE SIGMOIDOSCOPY N/A 04/18/2021   Procedure: FLEXIBLE SIGMOIDOSCOPY;  Surgeon: Gatha Mayer, MD;  Location: [facility];  Service: Endoscopy;  Laterality: N/A;   HERNIA REPAIR  1959   IR FLUORO GUIDE CV LINE RIGHT  03/18/2021   IR US GUIDE VASC ACCESS RIGHT  03/18/2021   LEFT HEART CATH AND CORONARY ANGIOGRAPHY N/A 04/04/2017   Procedure: Left Heart Cath and Coronary Angiography;  Surgeon: Belva Crome, MD;  Location: [place] [facility];  Service: Cardiovascular;  Laterality: N/A;   [place], 2004, 2007   PERIPHERAL VASCULAR THROMBECTOMY  02/21/2022   Procedure: PERIPHERAL VASCULAR THROMBECTOMY;  Surgeon: Waynetta Sandy, MD;  Location: [place] [facility];  Service: Cardiovascular;;  Right Tibial   RHINOPLASTY  1971   ROTATOR CUFF REPAIR  2003   SURGICAL REPAIR OF HEMORRHAGE  2015    TONSILLECTOMY  1968   Patient Active Problem List   Diagnosis Date Noted   Urinary tract infection without hematuria    Diabetes mellitus due to underlying condition with hypoglycemia without coma, with long-term current use of insulin    Acute encephalopathy 06/22/2022   Acute respiratory failure    Unresponsiveness    Pressure ulcer 06/07/2022   Acute stroke due to ischemia 06/03/2022   Chest pain 06/01/2022   History of CVA (cerebrovascular accident) 06/01/2022   Seizure    Encephalopathy    Below-knee amputation of right lower extremity    Leukocytosis    Foot infection 05/10/2022   PAD (peripheral artery disease) 05/10/2022   Gangrene of right foot 04/15/2022   Left hemiparesis 09/24/2021   Abnormality of gait 09/24/2021   Pain of right hand 08/06/2021   Steal syndrome of dialysis vascular access 08/06/2021   Subclavian steal syndrome of right subclavian artery 06/25/2021   Right hand weakness 06/25/2021   Stroke-like symptoms 05/06/2021   Bandemia    Cerebrovascular accident (CVA) of right basal ganglia 04/20/2021   External hemorrhoids    Basal ganglia infarction 04/16/2021   ESRD on dialysis 04/16/2021   Hypertensive urgency 03/10/2021   Hilar enlargement    Anemia of chronic disease    Chronic diastolic CHF (congestive heart failure) 03/09/2021   CKD (chronic kidney disease), stage V 03/08/2021   Diabetic retinopathy 03/08/2021   Hyperglycemia due to type 2 diabetes mellitus 03/08/2021   Pure hypercholesterolemia 03/08/2021   Anemia in chronic kidney disease 09/24/2020   Uncontrolled type 2 diabetes mellitus with hyperglycemia 03/19/2019   Osteopenia 09/04/2018   Migraine 09/04/2018   Asthma 09/04/2018   Pseudophakia of both eyes 07/17/2018   Pseudophakia of left eye 07/03/2018   Open angle with borderline findings and high glaucoma risk in left eye 07/03/2018   Age-related nuclear cataract of right eye 07/03/2018   CAD (coronary artery disease), native  coronary artery 04/27/2017   Abnormal nuclear stress test 04/04/2017   Shortness of breath 03/09/2017   Appendicitis    Age-related hypermature cataract of both eyes 12/20/2016   Uterine leiomyoma 11/27/2012   Dyslipidemia (high LDL; low HDL) 11/25/2011   Obesity (BMI 30-39.9) 11/25/2011   Hypertensive disorder 11/21/2011   Type 2 diabetes mellitus 11/21/2011   Abnormal EKG 11/21/2011    ONSET DATE: 12/29/2022 prosthesis delivery  REFERRING DIAG: [id number] (ICD-10-CM) - Below-knee amputation of right lower extremity   THERAPY DIAG:  Unsteadiness on feet  Other abnormalities of gait and mobility  Muscle weakness (generalized)  Hemiplegia and hemiparesis following cerebral infarction affecting left non-dominant  side  Non-pressure chronic ulcer of skin of other sites with other specified severity  Chronic pain of both knees  Rationale for Evaluation and Treatment: Rehabilitation  SUBJECTIVE:   SUBJECTIVE STATEMENT: She relays some fatigue today upon arrival, she reports Rt knee pain with standing activity  PERTINENT HISTORY: CVA / left hemiparesis due to right basal ganglia infarct 7/22, right hip bursitis, anxiety, dCHF, ESRD on HD, HTN, DM, CAD  PAIN:  Are you having pain?  Yes: NPRS scale: 7/10 Pain location: bilateral knees more patella Pain description: throbbing Aggravating factors: unknown Relieving factors: Voltarin & massage  PRECAUTIONS: Fall and Other: NO BP RUE  WEIGHT BEARING RESTRICTIONS: No  FALLS: Has patient fallen in last 6 months? No  LIVING ENVIRONMENT: Lives with: lives with their spouse and CNAs 24 hrs 7 days / wk Lives in: House Home Access: Ramped entrance Home layout: Multi level, Full bath on main level, and Able to live on main level with bedroom and bathroom Stairs: Yes: Internal: 16 steps; on left going up and External: 4 steps; on right going up Has following equipment at home: Single point cane, Walker - 2 wheeled, Wheelchair  (manual), and shower chair  PLOF: Independent with household mobility with device and Independent with community mobility with device  PATIENT GOALS: walk with prosthesis, stand & transfer.   OBJECTIVE:  COGNITION: Overall cognitive status: History of cognitive impairments - at baseline  POSTURE: rounded shoulders, forward head, decreased lumbar lordosis, increased thoracic kyphosis, and flexed trunk   LOWER EXTREMITY ROM: Eval / 01/05/2023:    BLEs PROM WFL except unable to assess hip ext  (Blank rows = not tested)  LOWER EXTREMITY MMT:  MMT Right eval Left eval  Hip flexion 3-/5   Hip extension 3-/5   Hip abduction 3-/5   Hip adduction    Hip internal rotation    Hip external rotation    Knee flexion 3-/5   Knee extension 3-/5   Ankle dorsiflexion NA   Ankle plantarflexion NA   Ankle inversion NA   Ankle eversion NA   Eval / 01/05/2023: left LE & UE hemiparesis.  In gait slides to advance LLE and knee / hip instability in stance.  (Blank rows = not tested)  TRANSFERS: Sit to stand: Max A pulling BUEs on //bars from w/c Stand to sit: Min A using BUEs //bars to control descent Lateral scoot transfers: Mod A w/c to / from mat table level & adjacent   GAIT: Gait pattern: step to pattern, decreased step length- Right, decreased step length- Left, decreased stance time- Right, decreased stance time- Left, decreased hip/knee flexion- Left, decreased ankle dorsiflexion- Left, Right hip hike, Left hip hike, knee flexed in stance- Left, antalgic, lateral hip instability, trunk flexed, wide BOS, and poor foot clearance- Left Distance walked: 3' Assistive device utilized: // bars & RLE TTA prosthesis Level of assistance: Total A / 2 person Comments:  LLE with poor clearance / slides foot forward and knee / hip instability in stance.   FUNCTIONAL TESTs:  Standing in //bars for 30 sec with modA.    CURRENT PROSTHETIC WEAR ASSESSMENT: Eval / 01/05/2023:  Patient is dependent  with: skin check, residual limb care, care of non-amputated limb, prosthetic cleaning, ply sock cleaning, correct ply sock adjustment, proper wear schedule/adjustment, and proper weight-bearing schedule/adjustment Donning prosthesis: Total A / family & CNA requires 100% cueing Doffing prosthesis: Total A / family & CNA requires 100% cueing Prosthetic wear tolerance: 1-1.5 hours, 1x/day, 6 of  7 days since delivery Prosthetic weight bearing tolerance: 3 minutes during eval with c/o knee pain Edema: pitting Residual limb condition: lateral incision with 36mm X 46mm long scabs with serous drainage, arrived with bandaid over wound,  dry skin, normal temperature & color, cylindrical shape Prosthetic description: silicon liner with pin lock suspension, total contact socket, SACH foot K code/activity level with prosthetic use: Level 1    TODAY'S TREATMENT:                                                                                                                             DATE:  01/26/2023: Prosthetic Training with TTA prosthesis Sit to stand in // bars with bilat UE support to help pull up with mod A X 5 reps total  Sit to stand from barstool X 5 reps with bilat UE support on bars, first rep min A, and last 4 reps she was able to perform independently Gait in //bars with bilat UE support of bars and PT providing min A to block knees and help with weight shift to advance opposite leg. We had home nurse provide wheelchair follow and she was able to walk 8 feet to end of bars X 6 reps with max encouragement each time and provided long seated rest break in between due to fatigue. Her last 2 reps she progressed to not needing knee block from PT and able to do with CGA using bilat UE support from bars  Therex Seated LAQ  2 X 10 bilat 1.5# ankle weights Seated marches 2X10 bilat 1.5# ankle weights Seated hip add ball squeeze 5 sec X 20 Seated back extension isometrics with ball behind her in wheelchair 5  sec X 20 Sitting on barstool without UE support for core activation and sitting balance, performed scapular retractions 5 sec hold X 10 and bilat shoulder flexion (Rt assists Lt due to hemiparesis) X 10  01/19/2023: Prosthetic Training with TTA prosthesis Sit to stand in // bars with bilat UE support to help pull up with max A X 5 reps total  Gait in //bars with bilat UE support of bars and PT providing mod to max A to block knees and help with weight shift to advance opposite leg. We had home nurse provide wheelchair follow and she was able to walk 8 feet to end of bars X 3 reps with max encouragement each time and provided long seated rest break in between due to fatigue.  Therex Seated LAQ  X 10 bilat 1# ankle weights Seated marches 2X10 bilat 1# ankle weights Seated hip add ball squeeze 5 sec X 20 Seated hip abd with blue band 2 X 15 Seated back extension isometrics with ball behind her in wheelchair 5 sec X 20   PATIENT EDUCATION: PATIENT EDUCATED ON FOLLOWING PROSTHETIC CARE: Education details: Use of Vivewear under liner with prosthesis wear, use of shrinker at all times out of prosthesis,   Skin check, Residual limb care, Prosthetic  cleaning, Correct ply sock adjustment, Propper donning, and Proper wear schedule/adjustment Prosthetic wear tolerance: 2 hours 2x/day, 7 days/week Person educated: Patient, Spouse, and Caregiver CNA Education method: Explanation, Demonstration, Tactile cues, and Verbal cues Education comprehension: verbalized understanding, verbal cues required, tactile cues required, and needs further education  HOME EXERCISE PROGRAM:  ASSESSMENT:  CLINICAL IMPRESSION: She continues to be limited in walking abilities by Rt knee pain however she is making progress in that she is able to take more steps and require less assistance with this in bars. We will work to progress her to outside of bars with RW when able.   OBJECTIVE IMPAIRMENTS: Abnormal gait, decreased  activity tolerance, decreased balance, decreased cognition, decreased coordination, decreased endurance, decreased knowledge of condition, decreased knowledge of use of DME, decreased mobility, difficulty walking, decreased ROM, decreased strength, postural dysfunction, prosthetic dependency , obesity, and pain.   ACTIVITY LIMITATIONS: standing, stairs, transfers, locomotion level, and safe prosthesis use.  PARTICIPATION LIMITATIONS: community activity, church, and household mobility  PERSONAL FACTORS: Age, Fitness, Past/current experiences, Time since onset of injury/illness/exacerbation, and 3+ comorbidities: see PMH  are also affecting patient's functional outcome.   REHAB POTENTIAL: Good  CLINICAL DECISION MAKING: Evolving/moderate complexity  EVALUATION COMPLEXITY: Moderate   GOALS: Goals reviewed with patient? Yes  SHORT TERM GOALS: Target date: 02/02/2023  Family / CNA donnes prosthesis correctly & verbalizes proper cleaning. Baseline: SEE OBJECTIVE DATA Goal status: INITIAL 2.  Patient tolerates prosthesis >6 hrs total /day without increase in skin issues or limb / knee pain <7/10 with standing. Baseline: SEE OBJECTIVE DATA Goal status: INITIAL  3.  Patient able to stand in //bars with CGA for 60 seconds.  Baseline: SEE OBJECTIVE DATA Goal status: INITIAL  4. Patient ambulates 10' // bars & prosthesis with maxA of 1 person. Baseline: SEE OBJECTIVE DATA Goal status: INITIAL   LONG TERM GOALS: Target date: 04/04/2023  Patient & family / CNA demonstrates & verbalized understanding of prosthetic care to enable safe utilization of prosthesis. Baseline: SEE OBJECTIVE DATA Goal status: INITIAL  Patient tolerates prosthesis wear >80% of awake hours without skin or limb pain issues. Baseline: SEE OBJECTIVE DATA Goal status: INITIAL  Stand balance with walker: static 2 min with supervision to enable ability for standing ADLS. Baseline: SEE OBJECTIVE DATA Goal status:  INITIAL  Patient ambulates >50' with RW & prosthesis only with family / CNA assist safely.  Baseline: SEE OBJECTIVE DATA Goal status: INITIAL  Patient transfers scooting with supervision and sit to/from stand with minA w/c to RW.  Baseline: SEE OBJECTIVE DATA Goal status: INITIAL   PLAN:  PT FREQUENCY: 2x/week  PT DURATION: 13 weeks / 90 days  PLANNED INTERVENTIONS: Therapeutic exercises, Therapeutic activity, Neuromuscular re-education, Balance training, Gait training, Patient/Family education, Self Care, Vestibular training, Prosthetic training, DME instructions, and Manual therapy  PLAN FOR NEXT SESSION: progress standing tolerance and walking tolerance as able. Try ambulation with RW if can get +2 assistance Debbe Odea, PT, DPT 01/26/2023, 4:01 PM

## 2023-01-30 ENCOUNTER — Ambulatory Visit: Payer: Medicare PPO | Admitting: Orthopedic Surgery

## 2023-01-30 DIAGNOSIS — Z89511 Acquired absence of right leg below knee: Secondary | ICD-10-CM | POA: Diagnosis not present

## 2023-01-30 DIAGNOSIS — S88111A Complete traumatic amputation at level between knee and ankle, right lower leg, initial encounter: Secondary | ICD-10-CM

## 2023-01-31 ENCOUNTER — Encounter: Payer: Self-pay | Admitting: Physical Therapy

## 2023-01-31 ENCOUNTER — Ambulatory Visit: Payer: Medicare PPO | Admitting: Physical Therapy

## 2023-01-31 DIAGNOSIS — M25562 Pain in left knee: Secondary | ICD-10-CM

## 2023-01-31 DIAGNOSIS — R2681 Unsteadiness on feet: Secondary | ICD-10-CM

## 2023-01-31 DIAGNOSIS — I69354 Hemiplegia and hemiparesis following cerebral infarction affecting left non-dominant side: Secondary | ICD-10-CM | POA: Diagnosis not present

## 2023-01-31 DIAGNOSIS — M6281 Muscle weakness (generalized): Secondary | ICD-10-CM

## 2023-01-31 DIAGNOSIS — L98498 Non-pressure chronic ulcer of skin of other sites with other specified severity: Secondary | ICD-10-CM

## 2023-01-31 DIAGNOSIS — M25561 Pain in right knee: Secondary | ICD-10-CM

## 2023-01-31 DIAGNOSIS — R2689 Other abnormalities of gait and mobility: Secondary | ICD-10-CM | POA: Diagnosis not present

## 2023-01-31 DIAGNOSIS — G8929 Other chronic pain: Secondary | ICD-10-CM

## 2023-01-31 NOTE — Therapy (Signed)
OUTPATIENT PHYSICAL THERAPY TREATMENT   Patient Name: Carolyn Russo MRN: [id number] DOB:[birth year], 72 y.o., female Today's Date: 01/31/2023  PCP: No PCP on file REFERRING PROVIDER: Aldean Baker, MD  END OF SESSION:  PT End of Session - 01/31/23 1525     Visit Number 7    Number of Visits 26    Date for PT Re-Evaluation 04/04/23    Authorization Type [employer] PPO    Authorization Time Period $20 co-pay    Progress Note Due on Visit 10    PT Start Time 1522    PT Stop Time 1605    PT Time Calculation (min) 43 min    Equipment Utilized During Treatment Gait belt    Activity Tolerance Patient tolerated treatment well;Patient limited by fatigue;Patient limited by pain    Behavior During Therapy WFL for tasks assessed/performed               Past Medical History:  Diagnosis Date   Acute GI bleeding    Allergy    Anemia    Anterior chest wall pain    Appendicitis 1965   Asthma    Body mass index 37.0-37.9, adult    Breast pain    Cataract    both eyes   CHF (congestive heart failure)    Chronic kidney disease    stage 5 - on dialysis   Cognitive change 04/20/2021   r/t cva 03/2821   Complication of anesthesia    Memory loss after general   Dehydration 2014   Deviated septum 1971   Diabetes mellitus    Dysphagia due to old stroke    easy to get strangled when eating   Dyspnea 2014   Extrinsic asthma    WITH ASTHMA ATTACK   Fibroid 1980   GERD (gastroesophageal reflux disease)    Heart murmur    History of migraine    History of seizure    with stroke   Hx gestational diabetes    Hyperlipidemia    Hypertension 2014   Inguinal hernia 1959   Malaise and fatigue 2014   Non-IgE mediated allergic asthma 2014   Obesity    Pelvic pain    Pregnancy, high-risk 1985   Stroke 04/20/2021   (CVA) of right basal ganglia   Tonsillitis 1968   Uterine fibroid 1980   Visual field defect    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   Past Surgical History:   Procedure Laterality Date   ABDOMINAL AORTOGRAM W/LOWER EXTREMITY N/A 02/21/2022   Procedure: ABDOMINAL AORTOGRAM W/LOWER EXTREMITY;  Surgeon: Maeola Harman, MD;  Location: [facility] INVASIVE CV LAB;  Service: Cardiovascular;  Laterality: N/A;   AMPUTATION Right 05/18/2022   Procedure: RIGHT BELOW KNEE AMPUTATION;  Surgeon: Nadara Mustard, MD;  Location: [facility] OR;  Service: Orthopedics;  Laterality: Right;   AMPUTATION TOE Right 04/15/2022   Procedure: AMPUTATION  LST TOERIGHT FOOT;  Surgeon: Edwin Cap, DPM;  Location: [facility] ORS;  Service: Podiatry;  Laterality: Right;   APPENDECTOMY  1959   BASCILIC VEIN TRANSPOSITION Right 04/30/2021   Procedure: RIGHT FIRST STAGE BASCILIC VEIN TRANSPOSITION;  Surgeon: Chuck Hint, MD;  Location: [facility] OR;  Service: Vascular;  Laterality: Right;   CESAREAN SECTION  1985   COLONOSCOPY     ESOPHAGOGASTRODUODENOSCOPY (EGD) WITH PROPOFOL N/A 04/18/2021   Procedure: ESOPHAGOGASTRODUODENOSCOPY (EGD) WITH PROPOFOL;  Surgeon: Iva Boop, MD;  Location: [facility] ENDOSCOPY;  Service: Endoscopy;  Laterality: N/A;   EYE SURGERY  bilateral cataract    FLEXIBLE SIGMOIDOSCOPY N/A 04/18/2021   Procedure: FLEXIBLE SIGMOIDOSCOPY;  Surgeon: Gatha Mayer, MD;  Location: [facility] ENDOSCOPY;  Service: Endoscopy;  Laterality: N/A;   HERNIA REPAIR  1959   IR FLUORO GUIDE CV LINE RIGHT  03/18/2021   IR US GUIDE VASC ACCESS RIGHT  03/18/2021   LEFT HEART CATH AND CORONARY ANGIOGRAPHY N/A 04/04/2017   Procedure: Left Heart Cath and Coronary Angiography;  Surgeon: Belva Crome, MD;  Location: [place] CV LAB;  Service: Cardiovascular;  Laterality: N/A;   [place], 2004, 2007   PERIPHERAL VASCULAR THROMBECTOMY  02/21/2022   Procedure: PERIPHERAL VASCULAR THROMBECTOMY;  Surgeon: Waynetta Sandy, MD;  Location: [place] CV LAB;  Service: Cardiovascular;;  Right Tibial   RHINOPLASTY  1971   ROTATOR CUFF REPAIR  2003   SURGICAL REPAIR OF HEMORRHAGE  2015    TONSILLECTOMY  1968   Patient Active Problem List   Diagnosis Date Noted   Urinary tract infection without hematuria    Diabetes mellitus due to underlying condition with hypoglycemia without coma, with long-term current use of insulin    Acute encephalopathy 06/22/2022   Acute respiratory failure    Unresponsiveness    Pressure ulcer 06/07/2022   Acute stroke due to ischemia 06/03/2022   Chest pain 06/01/2022   History of CVA (cerebrovascular accident) 06/01/2022   Seizure    Encephalopathy    Below-knee amputation of right lower extremity    Leukocytosis    Foot infection 05/10/2022   PAD (peripheral artery disease) 05/10/2022   Gangrene of right foot 04/15/2022   Left hemiparesis 09/24/2021   Abnormality of gait 09/24/2021   Pain of right hand 08/06/2021   Steal syndrome of dialysis vascular access 08/06/2021   Subclavian steal syndrome of right subclavian artery 06/25/2021   Right hand weakness 06/25/2021   Stroke-like symptoms 05/06/2021   Bandemia    Cerebrovascular accident (CVA) of right basal ganglia 04/20/2021   External hemorrhoids    Basal ganglia infarction 04/16/2021   ESRD on dialysis 04/16/2021   Hypertensive urgency 03/10/2021   Hilar enlargement    Anemia of chronic disease    Chronic diastolic CHF (congestive heart failure) 03/09/2021   CKD (chronic kidney disease), stage V 03/08/2021   Diabetic retinopathy 03/08/2021   Hyperglycemia due to type 2 diabetes mellitus 03/08/2021   Pure hypercholesterolemia 03/08/2021   Anemia in chronic kidney disease 09/24/2020   Uncontrolled type 2 diabetes mellitus with hyperglycemia 03/19/2019   Osteopenia 09/04/2018   Migraine 09/04/2018   Asthma 09/04/2018   Pseudophakia of both eyes 07/17/2018   Pseudophakia of left eye 07/03/2018   Open angle with borderline findings and high glaucoma risk in left eye 07/03/2018   Age-related nuclear cataract of right eye 07/03/2018   CAD (coronary artery disease), native  coronary artery 04/27/2017   Abnormal nuclear stress test 04/04/2017   Shortness of breath 03/09/2017   Appendicitis    Age-related hypermature cataract of both eyes 12/20/2016   Uterine leiomyoma 11/27/2012   Dyslipidemia (high LDL; low HDL) 11/25/2011   Obesity (BMI 30-39.9) 11/25/2011   Hypertensive disorder 11/21/2011   Type 2 diabetes mellitus 11/21/2011   Abnormal EKG 11/21/2011    ONSET DATE: 12/29/2022 prosthesis delivery  REFERRING DIAG: [id number] (ICD-10-CM) - Below-knee amputation of right lower extremity   THERAPY DIAG:  Unsteadiness on feet  Other abnormalities of gait and mobility  Muscle weakness (generalized)  Hemiplegia and hemiparesis following cerebral infarction affecting left non-dominant  side  Non-pressure chronic ulcer of skin of other sites with other specified severity  Chronic pain of both knees  Rationale for Evaluation and Treatment: Rehabilitation  SUBJECTIVE:   SUBJECTIVE STATEMENT: She has been wearing prosthesis 2 hours in morning or 3-4 hours in afternoon.  She stops wear because what she was told.  Husband reports patient's transfers have improved when wearing prosthesis.  She is having trouble with getting her legs crossed when trying to transfer in the bathroom.  Patient saw Dr. Lajoyce Corners yesterday who is recommending pads to be added to prosthesis.  PERTINENT HISTORY: CVA / left hemiparesis due to right basal ganglia infarct 7/22, right hip bursitis, anxiety, dCHF, ESRD on HD, HTN, DM, CAD  PAIN:  Are you having pain?  Yes: NPRS scale:0/10 sitting & standing  7/10 Pain location: bilateral knees more patella Pain description: throbbing Aggravating factors: unknown Relieving factors: Voltarin & massage  PRECAUTIONS: Fall and Other: NO BP RUE  WEIGHT BEARING RESTRICTIONS: No  FALLS: Has patient fallen in last 6 months? No  LIVING ENVIRONMENT: Lives with: lives with their spouse and CNAs 24 hrs 7 days / wk Lives in: House Home Access:  Ramped entrance Home layout: Multi level, Full bath on main level, and Able to live on main level with bedroom and bathroom Stairs: Yes: Internal: 16 steps; on left going up and External: 4 steps; on right going up Has following equipment at home: Single point cane, Walker - 2 wheeled, Wheelchair (manual), and shower chair  PLOF: Independent with household mobility with device and Independent with community mobility with device  PATIENT GOALS: walk with prosthesis, stand & transfer.   OBJECTIVE:  COGNITION: Overall cognitive status: History of cognitive impairments - at baseline  POSTURE: rounded shoulders, forward head, decreased lumbar lordosis, increased thoracic kyphosis, and flexed trunk   LOWER EXTREMITY ROM: Eval / 01/05/2023:    BLEs PROM WFL except unable to assess hip ext  (Blank rows = not tested)  LOWER EXTREMITY MMT:  MMT Right eval Left eval  Hip flexion 3-/5   Hip extension 3-/5   Hip abduction 3-/5   Hip adduction    Hip internal rotation    Hip external rotation    Knee flexion 3-/5   Knee extension 3-/5   Ankle dorsiflexion NA   Ankle plantarflexion NA   Ankle inversion NA   Ankle eversion NA   Eval / 01/05/2023: left LE & UE hemiparesis.  In gait slides to advance LLE and knee / hip instability in stance.  (Blank rows = not tested)  TRANSFERS: Sit to stand: Max A pulling BUEs on //bars from w/c Stand to sit: Min A using BUEs //bars to control descent Lateral scoot transfers: Mod A w/c to / from mat table level & adjacent   GAIT: Gait pattern: step to pattern, decreased step length- Right, decreased step length- Left, decreased stance time- Right, decreased stance time- Left, decreased hip/knee flexion- Left, decreased ankle dorsiflexion- Left, Right hip hike, Left hip hike, knee flexed in stance- Left, antalgic, lateral hip instability, trunk flexed, wide BOS, and poor foot clearance- Left Distance walked: 3' Assistive device utilized: // bars & RLE TTA  prosthesis Level of assistance: Total A / 2 person Comments:  LLE with poor clearance / slides foot forward and knee / hip instability in stance.   FUNCTIONAL TESTs:  Standing in //bars for 30 sec with modA.    CURRENT PROSTHETIC WEAR ASSESSMENT: Eval / 01/05/2023:  Patient is dependent with: skin  check, residual limb care, care of non-amputated limb, prosthetic cleaning, ply sock cleaning, correct ply sock adjustment, proper wear schedule/adjustment, and proper weight-bearing schedule/adjustment Donning prosthesis: Total A / family & CNA requires 100% cueing Doffing prosthesis: Total A / family & CNA requires 100% cueing Prosthetic wear tolerance: 1-1.5 hours, 1x/day, 6 of 7 days since delivery Prosthetic weight bearing tolerance: 3 minutes during eval with c/o knee pain Edema: pitting Residual limb condition: lateral incision with 3mm X 6mm long scabs with serous drainage, arrived with bandaid over wound,  dry skin, normal temperature & color, cylindrical shape Prosthetic description: silicon liner with pin lock suspension, total contact socket, SACH foot K code/activity level with prosthetic use: Level 1    TODAY'S TREATMENT:                                                                                                                             DATE:  01/31/2023: Prosthetic Training with TTA prosthesis Sit to stand in // bars with patient pushing off wheelchair with mod assist. PT recommended increasing prosthesis wear to 4 hours 2 times a day this week, 5 hours 2 times a day next week, 6 hours 2 times a day the following week and all day the fourth week.  When she is wearing the prosthesis for 5 hours 2 times a day then she can begin wearing the prosthesis to dialysis.  The weight of the prosthesis which is 2 kg needs to be subtracted from her weight and and weigh out.  If they adjust her dry weight in the future the weight of the prosthesis should not be included in this weight.   Patient and husband verbalized understanding Patient's husband verbalizes proper donning and reports CNA's are able to don the prosthesis correctly.  He also reports proper cleaning of the prosthesis. Patient ambulated 5 feet in parallel bars with 1 person max assist.  Patient reports severe pain with weightbearing on prosthesis. PT demo and verbal cues on sit to and from stand technique.  Patient able to perform sit to / from stand transfer from wheelchair to rolling walker with mod assist and constant cueing.  Patient able to stand with rolling walker for 30 seconds with min assist Patient ambulated 5 feet with rolling walker with max assist (second person for safety).   01/26/2023: Prosthetic Training with TTA prosthesis Sit to stand in // bars with bilat UE support to help pull up with mod A X 5 reps total  Sit to stand from barstool X 5 reps with bilat UE support on bars, first rep min A, and last 4 reps she was able to perform independently Gait in //bars with bilat UE support of bars and PT providing min A to block knees and help with weight shift to advance opposite leg. We had home nurse provide wheelchair follow and she was able to walk 8 feet to end of bars X 6 reps with max encouragement each time  and provided long seated rest break in between due to fatigue. Her last 2 reps she progressed to not needing knee block from PT and able to do with CGA using bilat UE support from bars  Therex Seated LAQ  2 X 10 bilat 1.5# ankle weights Seated marches 2X10 bilat 1.5# ankle weights Seated hip add ball squeeze 5 sec X 20 Seated back extension isometrics with ball behind her in wheelchair 5 sec X 20 Sitting on barstool without UE support for core activation and sitting balance, performed scapular retractions 5 sec hold X 10 and bilat shoulder flexion (Rt assists Lt due to hemiparesis) X 10  01/19/2023: Prosthetic Training with TTA prosthesis Sit to stand in // bars with bilat UE support to help  pull up with max A X 5 reps total  Gait in //bars with bilat UE support of bars and PT providing mod to max A to block knees and help with weight shift to advance opposite leg. We had home nurse provide wheelchair follow and she was able to walk 8 feet to end of bars X 3 reps with max encouragement each time and provided long seated rest break in between due to fatigue.  Therex Seated LAQ  X 10 bilat 1# ankle weights Seated marches 2X10 bilat 1# ankle weights Seated hip add ball squeeze 5 sec X 20 Seated hip abd with blue band 2 X 15 Seated back extension isometrics with ball behind her in wheelchair 5 sec X 20   PATIENT EDUCATION: PATIENT EDUCATED ON FOLLOWING PROSTHETIC CARE: Education details: Use of Vivewear under liner with prosthesis wear, use of shrinker at all times out of prosthesis,   Skin check, Residual limb care, Prosthetic cleaning, Correct ply sock adjustment, Propper donning, and Proper wear schedule/adjustment Prosthetic wear tolerance: 2 hours 2x/day, 7 days/week Person educated: Patient, Spouse, and Caregiver CNA Education method: Explanation, Demonstration, Tactile cues, and Verbal cues Education comprehension: verbalized understanding, verbal cues required, tactile cues required, and needs further education  HOME EXERCISE PROGRAM:  ASSESSMENT:  CLINICAL IMPRESSION: Patient met 3 of 4 STG's set for initial 30-day period.  PT was able to progress gait to rolling walker outside parallel bars today.  Patient and husband appear to understand how to increase wear per PT recommendations.  Pain continues to limit patient for standing and gait.  OBJECTIVE IMPAIRMENTS: Abnormal gait, decreased activity tolerance, decreased balance, decreased cognition, decreased coordination, decreased endurance, decreased knowledge of condition, decreased knowledge of use of DME, decreased mobility, difficulty walking, decreased ROM, decreased strength, postural dysfunction, prosthetic dependency  , obesity, and pain.   ACTIVITY LIMITATIONS: standing, stairs, transfers, locomotion level, and safe prosthesis use.  PARTICIPATION LIMITATIONS: community activity, church, and household mobility  PERSONAL FACTORS: Age, Fitness, Past/current experiences, Time since onset of injury/illness/exacerbation, and 3+ comorbidities: see PMH  are also affecting patient's functional outcome.   REHAB POTENTIAL: Good  CLINICAL DECISION MAKING: Evolving/moderate complexity  EVALUATION COMPLEXITY: Moderate   GOALS: Goals reviewed with patient? Yes  SHORT TERM GOALS: Target date: 02/02/2023  Family / CNA donnes prosthesis correctly & verbalizes proper cleaning. Baseline: SEE OBJECTIVE DATA Goal status: MET 01/31/2023 2.  Patient tolerates prosthesis >6 hrs total /day without increase in skin issues or limb / knee pain <7/10 with standing. Baseline: SEE OBJECTIVE DATA Goal status: ongoing 01/31/2023  3.  Patient able to stand in //bars with CGA for 60 seconds.  Baseline: SEE OBJECTIVE DATA Goal status: MET 01/31/2023  4. Patient ambulates // bars & prosthesis with  maxA of 1 person. Baseline: SEE OBJECTIVE DATA Goal status: MET 01/31/2023   LONG TERM GOALS: Target date: 04/04/2023  Patient & family / CNA demonstrates & verbalized understanding of prosthetic care to enable safe utilization of prosthesis. Baseline: SEE OBJECTIVE DATA Goal status: ongoing 01/31/2023  Patient tolerates prosthesis wear >80% of awake hours without skin or limb pain issues. Baseline: SEE OBJECTIVE DATA Goal status: ongoing 01/31/2023  Stand balance with walker: static 2 min with supervision to enable ability for standing ADLS. Baseline: SEE OBJECTIVE DATA Goal status: ongoing 01/31/2023  Patient ambulates >50' with RW & prosthesis only with family / CNA assist safely.  Baseline: SEE OBJECTIVE DATA Goal status: ongoing 01/31/2023  Patient transfers scooting with supervision and sit to/from stand with minA w/c to RW.   Baseline: SEE OBJECTIVE DATA Goal status: ongoing 01/31/2023   PLAN:  PT FREQUENCY: 2x/week  PT DURATION: 13 weeks / 90 days  PLANNED INTERVENTIONS: Therapeutic exercises, Therapeutic activity, Neuromuscular re-education, Balance training, Gait training, Patient/Family education, Self Care, Vestibular training, Prosthetic training, DME instructions, and Manual therapy  PLAN FOR NEXT SESSION: Set updated STG's.  Check on increase wear of prosthesis, work on transfers including stand pivot with rolling walker, work on standing balance and gait with rolling walker.    Vladimir Fasterobin Miryam Mcelhinney, PT, DPT 01/31/2023, 4:51 PM

## 2023-02-02 ENCOUNTER — Encounter: Payer: Self-pay | Admitting: Physical Therapy

## 2023-02-02 ENCOUNTER — Ambulatory Visit: Payer: Medicare PPO | Admitting: Physical Therapy

## 2023-02-02 DIAGNOSIS — R2689 Other abnormalities of gait and mobility: Secondary | ICD-10-CM

## 2023-02-02 DIAGNOSIS — I69354 Hemiplegia and hemiparesis following cerebral infarction affecting left non-dominant side: Secondary | ICD-10-CM

## 2023-02-02 DIAGNOSIS — R2681 Unsteadiness on feet: Secondary | ICD-10-CM | POA: Diagnosis not present

## 2023-02-02 DIAGNOSIS — L98498 Non-pressure chronic ulcer of skin of other sites with other specified severity: Secondary | ICD-10-CM

## 2023-02-02 DIAGNOSIS — M25562 Pain in left knee: Secondary | ICD-10-CM

## 2023-02-02 DIAGNOSIS — R278 Other lack of coordination: Secondary | ICD-10-CM

## 2023-02-02 DIAGNOSIS — M25561 Pain in right knee: Secondary | ICD-10-CM

## 2023-02-02 DIAGNOSIS — M6281 Muscle weakness (generalized): Secondary | ICD-10-CM

## 2023-02-02 DIAGNOSIS — G8929 Other chronic pain: Secondary | ICD-10-CM

## 2023-02-02 NOTE — Therapy (Signed)
OUTPATIENT PHYSICAL THERAPY TREATMENT   Patient Name: Carolyn Russo MRN: [id number] DOB:[birth year], 72 y.o., female Today's Date: 02/02/2023  PCP: No PCP on file REFERRING PROVIDER: Aldean Baker, MD  END OF SESSION:  PT End of Session - 02/02/23 1615     Visit Number 8    Number of Visits 26    Date for PT Re-Evaluation 04/04/23    Authorization Type [employer] PPO    Authorization Time Period $20 co-pay    Progress Note Due on Visit 10    PT Start Time 1601    PT Stop Time 1641    PT Time Calculation (min) 40 min    Equipment Utilized During Treatment Gait belt    Activity Tolerance Patient tolerated treatment well;Patient limited by fatigue;Patient limited by pain    Behavior During Therapy WFL for tasks assessed/performed               Past Medical History:  Diagnosis Date   Acute GI bleeding    Allergy    Anemia    Anterior chest wall pain    Appendicitis 1965   Asthma    Body mass index 37.0-37.9, adult    Breast pain    Cataract    both eyes   CHF (congestive heart failure)    Chronic kidney disease    stage 5 - on dialysis   Cognitive change 04/20/2021   r/t cva 03/2821   Complication of anesthesia    Memory loss after general   Dehydration 2014   Deviated septum 1971   Diabetes mellitus    Dysphagia due to old stroke    easy to get strangled when eating   Dyspnea 2014   Extrinsic asthma    WITH ASTHMA ATTACK   Fibroid 1980   GERD (gastroesophageal reflux disease)    Heart murmur    History of migraine    History of seizure    with stroke   Hx gestational diabetes    Hyperlipidemia    Hypertension 2014   Inguinal hernia 1959   Malaise and fatigue 2014   Non-IgE mediated allergic asthma 2014   Obesity    Pelvic pain    Pregnancy, high-risk 1985   Stroke 04/20/2021   (CVA) of right basal ganglia   Tonsillitis 1968   Uterine fibroid 1980   Visual field defect    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   Past Surgical History:   Procedure Laterality Date   ABDOMINAL AORTOGRAM W/LOWER EXTREMITY N/A 02/21/2022   Procedure: ABDOMINAL AORTOGRAM W/LOWER EXTREMITY;  Surgeon: Maeola Harman, MD;  Location: [facility] INVASIVE CV LAB;  Service: Cardiovascular;  Laterality: N/A;   AMPUTATION Right 05/18/2022   Procedure: RIGHT BELOW KNEE AMPUTATION;  Surgeon: Nadara Mustard, MD;  Location: [name] OR;  Service: Orthopedics;  Laterality: Right;   AMPUTATION TOE Right 04/15/2022   Procedure: AMPUTATION  LST TOERIGHT FOOT;  Surgeon: Edwin Cap, DPM;  Location: [facility] ORS;  Service: Podiatry;  Laterality: Right;   APPENDECTOMY  1959   BASCILIC VEIN TRANSPOSITION Right 04/30/2021   Procedure: RIGHT FIRST STAGE BASCILIC VEIN TRANSPOSITION;  Surgeon: Chuck Hint, MD;  Location: [facility] OR;  Service: Vascular;  Laterality: Right;   CESAREAN SECTION  1985   COLONOSCOPY     ESOPHAGOGASTRODUODENOSCOPY (EGD) WITH PROPOFOL N/A 04/18/2021   Procedure: ESOPHAGOGASTRODUODENOSCOPY (EGD) WITH PROPOFOL;  Surgeon: Iva Boop, MD;  Location: [facility] ENDOSCOPY;  Service: Endoscopy;  Laterality: N/A;   EYE SURGERY  bilateral cataract    FLEXIBLE SIGMOIDOSCOPY N/A 04/18/2021   Procedure: FLEXIBLE SIGMOIDOSCOPY;  Surgeon: Gatha Mayer, MD;  Location: [facility] ENDOSCOPY;  Service: Endoscopy;  Laterality: N/A;   HERNIA REPAIR  1959   IR FLUORO GUIDE CV LINE RIGHT  03/18/2021   IR US GUIDE VASC ACCESS RIGHT  03/18/2021   LEFT HEART CATH AND CORONARY ANGIOGRAPHY N/A 04/04/2017   Procedure: Left Heart Cath and Coronary Angiography;  Surgeon: Belva Crome, MD;  Location: [place] CV LAB;  Service: Cardiovascular;  Laterality: N/A;   [place], 2004, 2007   PERIPHERAL VASCULAR THROMBECTOMY  02/21/2022   Procedure: PERIPHERAL VASCULAR THROMBECTOMY;  Surgeon: Waynetta Sandy, MD;  Location: [place] CV LAB;  Service: Cardiovascular;;  Right Tibial   RHINOPLASTY  1971   ROTATOR CUFF REPAIR  2003   SURGICAL REPAIR OF HEMORRHAGE  2015    TONSILLECTOMY  1968   Patient Active Problem List   Diagnosis Date Noted   Urinary tract infection without hematuria    Diabetes mellitus due to underlying condition with hypoglycemia without coma, with long-term current use of insulin    Acute encephalopathy 06/22/2022   Acute respiratory failure    Unresponsiveness    Pressure ulcer 06/07/2022   Acute stroke due to ischemia 06/03/2022   Chest pain 06/01/2022   History of CVA (cerebrovascular accident) 06/01/2022   Seizure    Encephalopathy    Below-knee amputation of right lower extremity    Leukocytosis    Foot infection 05/10/2022   PAD (peripheral artery disease) 05/10/2022   Gangrene of right foot 04/15/2022   Left hemiparesis 09/24/2021   Abnormality of gait 09/24/2021   Pain of right hand 08/06/2021   Steal syndrome of dialysis vascular access 08/06/2021   Subclavian steal syndrome of right subclavian artery 06/25/2021   Right hand weakness 06/25/2021   Stroke-like symptoms 05/06/2021   Bandemia    Cerebrovascular accident (CVA) of right basal ganglia 04/20/2021   External hemorrhoids    Basal ganglia infarction 04/16/2021   ESRD on dialysis 04/16/2021   Hypertensive urgency 03/10/2021   Hilar enlargement    Anemia of chronic disease    Chronic diastolic CHF (congestive heart failure) 03/09/2021   CKD (chronic kidney disease), stage V 03/08/2021   Diabetic retinopathy 03/08/2021   Hyperglycemia due to type 2 diabetes mellitus 03/08/2021   Pure hypercholesterolemia 03/08/2021   Anemia in chronic kidney disease 09/24/2020   Uncontrolled type 2 diabetes mellitus with hyperglycemia 03/19/2019   Osteopenia 09/04/2018   Migraine 09/04/2018   Asthma 09/04/2018   Pseudophakia of both eyes 07/17/2018   Pseudophakia of left eye 07/03/2018   Open angle with borderline findings and high glaucoma risk in left eye 07/03/2018   Age-related nuclear cataract of right eye 07/03/2018   CAD (coronary artery disease), native  coronary artery 04/27/2017   Abnormal nuclear stress test 04/04/2017   Shortness of breath 03/09/2017   Appendicitis    Age-related hypermature cataract of both eyes 12/20/2016   Uterine leiomyoma 11/27/2012   Dyslipidemia (high LDL; low HDL) 11/25/2011   Obesity (BMI 30-39.9) 11/25/2011   Hypertensive disorder 11/21/2011   Type 2 diabetes mellitus 11/21/2011   Abnormal EKG 11/21/2011    ONSET DATE: 12/29/2022 prosthesis delivery  REFERRING DIAG: [id number] (ICD-10-CM) - Below-knee amputation of right lower extremity   THERAPY DIAG:  Unsteadiness on feet  Other abnormalities of gait and mobility  Muscle weakness (generalized)  Hemiplegia and hemiparesis following cerebral infarction affecting left non-dominant  side  Non-pressure chronic ulcer of skin of other sites with other specified severity  Chronic pain of both knees  Other lack of coordination  Rationale for Evaluation and Treatment: Rehabilitation  SUBJECTIVE:   SUBJECTIVE STATEMENT: She relays she has increased wear to 4 hours at a time as recommended by PT  PERTINENT HISTORY: CVA / left hemiparesis due to right basal ganglia infarct 7/22, right hip bursitis, anxiety, dCHF, ESRD on HD, HTN, DM, CAD  PAIN:  Are you having pain?  Yes: NPRS scale:0/10 sitting & standing  7/10 Pain location: bilateral knees more patella Pain description: throbbing Aggravating factors: unknown Relieving factors: Voltarin & massage  PRECAUTIONS: Fall and Other: NO BP RUE  WEIGHT BEARING RESTRICTIONS: No  FALLS: Has patient fallen in last 6 months? No  LIVING ENVIRONMENT: Lives with: lives with their spouse and CNAs 24 hrs 7 days / wk Lives in: House Home Access: Ramped entrance Home layout: Multi level, Full bath on main level, and Able to live on main level with bedroom and bathroom Stairs: Yes: Internal: 16 steps; on left going up and External: 4 steps; on right going up Has following equipment at home: Single point cane,  Walker - 2 wheeled, Wheelchair (manual), and shower chair  PLOF: Independent with household mobility with device and Independent with community mobility with device  PATIENT GOALS: walk with prosthesis, stand & transfer.   OBJECTIVE:  COGNITION: Overall cognitive status: History of cognitive impairments - at baseline  POSTURE: rounded shoulders, forward head, decreased lumbar lordosis, increased thoracic kyphosis, and flexed trunk   LOWER EXTREMITY ROM: Eval / 01/05/2023:    BLEs PROM WFL except unable to assess hip ext  (Blank rows = not tested)  LOWER EXTREMITY MMT:  MMT Right eval Left eval  Hip flexion 3-/5   Hip extension 3-/5   Hip abduction 3-/5   Hip adduction    Hip internal rotation    Hip external rotation    Knee flexion 3-/5   Knee extension 3-/5   Ankle dorsiflexion NA   Ankle plantarflexion NA   Ankle inversion NA   Ankle eversion NA   Eval / 01/05/2023: left LE & UE hemiparesis.  In gait slides to advance LLE and knee / hip instability in stance.  (Blank rows = not tested)  TRANSFERS: Sit to stand: Max A pulling BUEs on //bars from w/c Stand to sit: Min A using BUEs //bars to control descent Lateral scoot transfers: Mod A w/c to / from mat table level & adjacent   GAIT: Gait pattern: step to pattern, decreased step length- Right, decreased step length- Left, decreased stance time- Right, decreased stance time- Left, decreased hip/knee flexion- Left, decreased ankle dorsiflexion- Left, Right hip hike, Left hip hike, knee flexed in stance- Left, antalgic, lateral hip instability, trunk flexed, wide BOS, and poor foot clearance- Left Distance walked: 3' Assistive device utilized: // bars & RLE TTA prosthesis Level of assistance: Total A / 2 person Comments:  LLE with poor clearance / slides foot forward and knee / hip instability in stance.   FUNCTIONAL TESTs:  Standing in //bars for 30 sec with modA.    CURRENT PROSTHETIC WEAR ASSESSMENT: Eval /  01/05/2023:  Patient is dependent with: skin check, residual limb care, care of non-amputated limb, prosthetic cleaning, ply sock cleaning, correct ply sock adjustment, proper wear schedule/adjustment, and proper weight-bearing schedule/adjustment Donning prosthesis: Total A / family & CNA requires 100% cueing Doffing prosthesis: Total A / family & CNA requires  100% cueing Prosthetic wear tolerance: 1-1.5 hours, 1x/day, 6 of 7 days since delivery Prosthetic weight bearing tolerance: 3 minutes during eval with c/o knee pain Edema: pitting Residual limb condition: lateral incision with 54mm X 70mm long scabs with serous drainage, arrived with bandaid over wound,  dry skin, normal temperature & color, cylindrical shape Prosthetic description: silicon liner with pin lock suspension, total contact socket, SACH foot K code/activity level with prosthetic use: Level 1    TODAY'S TREATMENT:                                                                                                                             DATE:  02/02/2023: Prosthetic Training with TTA prosthesis Sit to stand with RW and patient pushing off wheelchair with mod assist, requires cues for set up, hand placement, and sequence, X 5 reps total Patient ambulated 5 feet X 2 with rolling walker with max assist (second person for safety (husband) and one extra person for chair follow (CNA)). Stand pivot transfers from Wheelchair to standard chair with arms placed 90 deg to simulate toilet transfer. Transferred to chair and back with mod A with max cues for sequence for foot placement, where to push up from, RW management, and to shift weight fully onto one leg to lift and turn on other leg, then to take steps back until she touches the chair with the back of her legs before sitting.  01/31/2023: Prosthetic Training with TTA prosthesis Sit to stand in // bars with patient pushing off wheelchair with mod assist. PT recommended increasing  prosthesis wear to 4 hours 2 times a day this week, 5 hours 2 times a day next week, 6 hours 2 times a day the following week and all day the fourth week.  When she is wearing the prosthesis for 5 hours 2 times a day then she can begin wearing the prosthesis to dialysis.  The weight of the prosthesis which is 2 kg needs to be subtracted from her weight and and weigh out.  If they adjust her dry weight in the future the weight of the prosthesis should not be included in this weight.  Patient and husband verbalized understanding Patient's husband verbalizes proper donning and reports CNA's are able to don the prosthesis correctly.  He also reports proper cleaning of the prosthesis. Patient ambulated 5 feet in parallel bars with 1 person max assist.  Patient reports severe pain with weightbearing on prosthesis. PT demo and verbal cues on sit to and from stand technique.  Patient able to perform sit to / from stand transfer from wheelchair to rolling walker with mod assist and constant cueing.  Patient able to stand with rolling walker for 30 seconds with min assist Patient ambulated 5 feet with rolling walker with max assist (second person for safety).   01/26/2023: Prosthetic Training with TTA prosthesis Sit to stand in // bars with bilat UE support to help pull up with mod  A X 5 reps total  Sit to stand from barstool X 5 reps with bilat UE support on bars, first rep min A, and last 4 reps she was able to perform independently Gait in //bars with bilat UE support of bars and PT providing min A to block knees and help with weight shift to advance opposite leg. We had home nurse provide wheelchair follow and she was able to walk 8 feet to end of bars X 6 reps with max encouragement each time and provided long seated rest break in between due to fatigue. Her last 2 reps she progressed to not needing knee block from PT and able to do with CGA using bilat UE support from bars  Therex Seated LAQ  2 X 10 bilat  1.5# ankle weights Seated marches 2X10 bilat 1.5# ankle weights Seated hip add ball squeeze 5 sec X 20 Seated back extension isometrics with ball behind her in wheelchair 5 sec X 20 Sitting on barstool without UE support for core activation and sitting balance, performed scapular retractions 5 sec hold X 10 and bilat shoulder flexion (Rt assists Lt due to hemiparesis) X 10  01/19/2023: Prosthetic Training with TTA prosthesis Sit to stand in // bars with bilat UE support to help pull up with max A X 5 reps total  Gait in //bars with bilat UE support of bars and PT providing mod to max A to block knees and help with weight shift to advance opposite leg. We had home nurse provide wheelchair follow and she was able to walk 8 feet to end of bars X 3 reps with max encouragement each time and provided long seated rest break in between due to fatigue.  Therex Seated LAQ  X 10 bilat 1# ankle weights Seated marches 2X10 bilat 1# ankle weights Seated hip add ball squeeze 5 sec X 20 Seated hip abd with blue band 2 X 15 Seated back extension isometrics with ball behind her in wheelchair 5 sec X 20   PATIENT EDUCATION: PATIENT EDUCATED ON FOLLOWING PROSTHETIC CARE: Education details: Use of Vivewear under liner with prosthesis wear, use of shrinker at all times out of prosthesis,   Skin check, Residual limb care, Prosthetic cleaning, Correct ply sock adjustment, Propper donning, and Proper wear schedule/adjustment Prosthetic wear tolerance: 2 hours 2x/day, 7 days/week Person educated: Patient, Spouse, and Caregiver CNA Education method: Explanation, Demonstration, Tactile cues, and Verbal cues Education comprehension: verbalized understanding, verbal cues required, tactile cues required, and needs further education  HOME EXERCISE PROGRAM:  ASSESSMENT:  CLINICAL IMPRESSION: Session focused on prosthethic training for functional mobility including straight line walking with RW and stand pivot transfers  90 deg using RW. She improved with this by end of session but will continue to need more work with this to be able to perform more safely and with less assistance.   OBJECTIVE IMPAIRMENTS: Abnormal gait, decreased activity tolerance, decreased balance, decreased cognition, decreased coordination, decreased endurance, decreased knowledge of condition, decreased knowledge of use of DME, decreased mobility, difficulty walking, decreased ROM, decreased strength, postural dysfunction, prosthetic dependency , obesity, and pain.   ACTIVITY LIMITATIONS: standing, stairs, transfers, locomotion level, and safe prosthesis use.  PARTICIPATION LIMITATIONS: community activity, church, and household mobility  PERSONAL FACTORS: Age, Fitness, Past/current experiences, Time since onset of injury/illness/exacerbation, and 3+ comorbidities: see PMH  are also affecting patient's functional outcome.   REHAB POTENTIAL: Good  CLINICAL DECISION MAKING: Evolving/moderate complexity  EVALUATION COMPLEXITY: Moderate   GOALS: Goals reviewed with patient?  Yes  SHORT TERM GOALS: Target date: 02/02/2023  Family / CNA donnes prosthesis correctly & verbalizes proper cleaning. Baseline: SEE OBJECTIVE DATA Goal status: MET 01/31/2023 2.  Patient tolerates prosthesis >6 hrs total /day without increase in skin issues or limb / knee pain <7/10 with standing. Baseline: SEE OBJECTIVE DATA Goal status: ongoing 01/31/2023  3.  Patient able to stand in //bars with CGA for 60 seconds.  Baseline: SEE OBJECTIVE DATA Goal status: MET 01/31/2023  4. Patient ambulates // bars & prosthesis with maxA of 1 person. Baseline: SEE OBJECTIVE DATA Goal status: MET 01/31/2023  New short term goals target date 03/04/23  .1 Patient ambulates 10 feet with RW max A of 1 person with wheelchair follow if necessary Baseline: 5 feet max A+2 Goal status: new goal  2. Pt will be able to complete stand pivot transfers with RW mod A of one  person. Baseline: max A  Goal status: NEW   LONG TERM GOALS: Target date: 04/04/2023  Patient & family / CNA demonstrates & verbalized understanding of prosthetic care to enable safe utilization of prosthesis. Baseline: SEE OBJECTIVE DATA Goal status: ongoing 01/31/2023  Patient tolerates prosthesis wear >80% of awake hours without skin or limb pain issues. Baseline: SEE OBJECTIVE DATA Goal status: ongoing 01/31/2023  Stand balance with walker: static 2 min with supervision to enable ability for standing ADLS. Baseline: SEE OBJECTIVE DATA Goal status: ongoing 01/31/2023  Patient ambulates >50' with RW & prosthesis only with family / CNA assist safely.  Baseline: SEE OBJECTIVE DATA Goal status: ongoing 01/31/2023  Patient transfers scooting with supervision and sit to/from stand with minA w/c to RW.  Baseline: SEE OBJECTIVE DATA Goal status: ongoing 01/31/2023   PLAN:  PT FREQUENCY: 2x/week  PT DURATION: 13 weeks / 90 days  PLANNED INTERVENTIONS: Therapeutic exercises, Therapeutic activity, Neuromuscular re-education, Balance training, Gait training, Patient/Family education, Self Care, Vestibular training, Prosthetic training, DME instructions, and Manual therapy  PLAN FOR NEXT SESSION: gait and transfers including stand pivot with rolling walker, work on standing balance and gait with rolling walker.    April Manson, PT, DPT 02/02/2023, 4:15 PM

## 2023-02-07 ENCOUNTER — Encounter: Payer: Self-pay | Admitting: Physical Therapy

## 2023-02-07 ENCOUNTER — Ambulatory Visit: Payer: Medicare PPO | Admitting: Physical Therapy

## 2023-02-07 DIAGNOSIS — L98498 Non-pressure chronic ulcer of skin of other sites with other specified severity: Secondary | ICD-10-CM | POA: Diagnosis not present

## 2023-02-07 DIAGNOSIS — M25562 Pain in left knee: Secondary | ICD-10-CM

## 2023-02-07 DIAGNOSIS — I69354 Hemiplegia and hemiparesis following cerebral infarction affecting left non-dominant side: Secondary | ICD-10-CM

## 2023-02-07 DIAGNOSIS — M6281 Muscle weakness (generalized): Secondary | ICD-10-CM

## 2023-02-07 DIAGNOSIS — R2681 Unsteadiness on feet: Secondary | ICD-10-CM | POA: Diagnosis not present

## 2023-02-07 DIAGNOSIS — R2689 Other abnormalities of gait and mobility: Secondary | ICD-10-CM

## 2023-02-07 DIAGNOSIS — G8929 Other chronic pain: Secondary | ICD-10-CM

## 2023-02-07 DIAGNOSIS — M25561 Pain in right knee: Secondary | ICD-10-CM

## 2023-02-07 NOTE — Therapy (Signed)
OUTPATIENT PHYSICAL THERAPY TREATMENT   Patient Name: Carolyn Russo MRN: [id number] DOB:Nov 19, 1950, 72 y.o., female Today's Date: 02/07/2023  PCP: No PCP on file REFERRING PROVIDER: Aldean Baker, MD  END OF SESSION:  PT End of Session - 02/07/23 1523     Visit Number 9    Number of Visits 26    Date for PT Re-Evaluation 04/04/23    Authorization Type [employer] PPO    Authorization Time Period $20 co-pay    Progress Note Due on Visit 10    PT Start Time 1519    PT Stop Time 1600    PT Time Calculation (min) 41 min    Equipment Utilized During Treatment Gait belt    Activity Tolerance Patient tolerated treatment well;Patient limited by fatigue;Patient limited by pain    Behavior During Therapy WFL for tasks assessed/performed               Past Medical History:  Diagnosis Date   Acute GI bleeding    Allergy    Anemia    Anterior chest wall pain    Appendicitis 1965   Asthma    Body mass index 37.0-37.9, adult    Breast pain    Cataract    both eyes   CHF (congestive heart failure)    Chronic kidney disease    stage 5 - on dialysis   Cognitive change 04/20/2021   r/t cva 03/2821   Complication of anesthesia    Memory loss after general   Dehydration 2014   Deviated septum 1971   Diabetes mellitus    Dysphagia due to old stroke    easy to get strangled when eating   Dyspnea 2014   Extrinsic asthma    WITH ASTHMA ATTACK   Fibroid 1980   GERD (gastroesophageal reflux disease)    Heart murmur    History of migraine    History of seizure    with stroke   Hx gestational diabetes    Hyperlipidemia    Hypertension 2014   Inguinal hernia 1959   Malaise and fatigue 2014   Non-IgE mediated allergic asthma 2014   Obesity    Pelvic pain    Pregnancy, high-risk 1985   Stroke 04/20/2021   (CVA) of right basal ganglia   Tonsillitis 1968   Uterine fibroid 1980   Visual field defect    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   Past Surgical History:   Procedure Laterality Date   ABDOMINAL AORTOGRAM W/LOWER EXTREMITY N/A 02/21/2022   Procedure: ABDOMINAL AORTOGRAM W/LOWER EXTREMITY;  Surgeon: Maeola Harman, MD;  Location: [facility] INVASIVE CV LAB;  Service: Cardiovascular;  Laterality: N/A;   AMPUTATION Right 05/18/2022   Procedure: RIGHT BELOW KNEE AMPUTATION;  Surgeon: Nadara Mustard, MD;  Location: [facility] OR;  Service: Orthopedics;  Laterality: Right;   AMPUTATION TOE Right 04/15/2022   Procedure: AMPUTATION  LST TOERIGHT FOOT;  Surgeon: Edwin Cap, DPM;  Location: [facility] ORS;  Service: Podiatry;  Laterality: Right;   APPENDECTOMY  1959   BASCILIC VEIN TRANSPOSITION Right 04/30/2021   Procedure: RIGHT FIRST STAGE BASCILIC VEIN TRANSPOSITION;  Surgeon: Chuck Hint, MD;  Location: [facility] OR;  Service: Vascular;  Laterality: Right;   CESAREAN SECTION  1985   COLONOSCOPY     ESOPHAGOGASTRODUODENOSCOPY (EGD) WITH PROPOFOL N/A 04/18/2021   Procedure: ESOPHAGOGASTRODUODENOSCOPY (EGD) WITH PROPOFOL;  Surgeon: Iva Boop, MD;  Location[facility] ENDOSCOPY;  Service: Endoscopy;  Laterality: N/A;   EYE SURGERY  bilateral cataract    FLEXIBLE SIGMOIDOSCOPY N/A 04/18/2021   Procedure: FLEXIBLE SIGMOIDOSCOPY;  Surgeon: Iva Boop, MD;  Location: [facility] ENDOSCOPY;  Service: Endoscopy;  Laterality: N/A;   HERNIA REPAIR  1959   IR FLUORO GUIDE CV LINE RIGHT  03/18/2021   IR US GUIDE VASC ACCESS RIGHT  03/18/2021   LEFT HEART CATH AND CORONARY ANGIOGRAPHY N/A 04/04/2017   Procedure: Left Heart Cath and Coronary Angiography;  Surgeon: Lyn Records, MD;  Location: [facility] INVASIVE CV LAB;  Service: Cardiovascular;  Laterality: N/A;   MYOMECTOMY  1980, 2004, 2007   PERIPHERAL VASCULAR THROMBECTOMY  02/21/2022   Procedure: PERIPHERAL VASCULAR THROMBECTOMY;  Surgeon: Maeola Harman, MD;  Location: [facility] INVASIVE CV LAB;  Service: Cardiovascular;;  Right Tibial   RHINOPLASTY  1971   ROTATOR CUFF REPAIR  2003   SURGICAL REPAIR OF HEMORRHAGE  2015    TONSILLECTOMY  1968   Patient Active Problem List   Diagnosis Date Noted   Urinary tract infection without hematuria    Diabetes mellitus due to underlying condition with hypoglycemia without coma, with long-term current use of insulin    Acute encephalopathy 06/22/2022   Acute respiratory failure    Unresponsiveness    Pressure ulcer 06/07/2022   Acute stroke due to ischemia 06/03/2022   Chest pain 06/01/2022   History of CVA (cerebrovascular accident) 06/01/2022   Seizure    Encephalopathy    Below-knee amputation of right lower extremity    Leukocytosis    Foot infection 05/10/2022   PAD (peripheral artery disease) 05/10/2022   Gangrene of right foot 04/15/2022   Left hemiparesis 09/24/2021   Abnormality of gait 09/24/2021   Pain of right hand 08/06/2021   Steal syndrome of dialysis vascular access 08/06/2021   Subclavian steal syndrome of right subclavian artery 06/25/2021   Right hand weakness 06/25/2021   Stroke-like symptoms 05/06/2021   Bandemia    Cerebrovascular accident (CVA) of right basal ganglia 04/20/2021   External hemorrhoids    Basal ganglia infarction 04/16/2021   ESRD on dialysis 04/16/2021   Hypertensive urgency 03/10/2021   Hilar enlargement    Anemia of chronic disease    Chronic diastolic CHF (congestive heart failure) 03/09/2021   CKD (chronic kidney disease), stage V 03/08/2021   Diabetic retinopathy 03/08/2021   Hyperglycemia due to type 2 diabetes mellitus 03/08/2021   Pure hypercholesterolemia 03/08/2021   Anemia in chronic kidney disease 09/24/2020   Uncontrolled type 2 diabetes mellitus with hyperglycemia 03/19/2019   Osteopenia 09/04/2018   Migraine 09/04/2018   Asthma 09/04/2018   Pseudophakia of both eyes 07/17/2018   Pseudophakia of left eye 07/03/2018   Open angle with borderline findings and high glaucoma risk in left eye 07/03/2018   Age-related nuclear cataract of right eye 07/03/2018   CAD (coronary artery disease), native  coronary artery 04/27/2017   Abnormal nuclear stress test 04/04/2017   Shortness of breath 03/09/2017   Appendicitis    Age-related hypermature cataract of both eyes 12/20/2016   Uterine leiomyoma 11/27/2012   Dyslipidemia (high LDL; low HDL) 11/25/2011   Obesity (BMI 30-39.9) 11/25/2011   Hypertensive disorder 11/21/2011   Type 2 diabetes mellitus 11/21/2011   Abnormal EKG 11/21/2011    ONSET DATE: 12/29/2022 prosthesis delivery  REFERRING DIAG: Z61.096E (ICD-10-CM) - Below-knee amputation of right lower extremity   THERAPY DIAG:  Unsteadiness on feet  Other abnormalities of gait and mobility  Muscle weakness (generalized)  Non-pressure chronic ulcer of skin of other sites with  other specified severity  Hemiplegia and hemiparesis following cerebral infarction affecting left non-dominant side  Chronic pain of both knees  Rationale for Evaluation and Treatment: Rehabilitation  SUBJECTIVE:   SUBJECTIVE STATEMENT: Patient is scheduled to see prosthetist prior to PT next visit on Thursday.  They are planning a trip to visit family in June flying. They also plan to use car when there. They use a handicap Zenaida Niece here and she remains in w/c.   PERTINENT HISTORY: CVA / left hemiparesis due to right basal ganglia infarct 7/22, right hip bursitis, anxiety, dCHF, ESRD on HD, HTN, DM, CAD  PAIN:  Are you having pain?  Yes: NPRS scale:0/10 sitting & standing 7/10 (less frequent & not as loud c/o pain in PT today) Pain location: bilateral knees more patella Pain description: throbbing Aggravating factors: unknown Relieving factors: Voltarin & massage  PRECAUTIONS: Fall and Other: NO BP RUE  WEIGHT BEARING RESTRICTIONS: No  FALLS: Has patient fallen in last 6 months? No  LIVING ENVIRONMENT: Lives with: lives with their spouse and CNAs 24 hrs 7 days / wk Lives in: House Home Access: Ramped entrance Home layout: Multi level, Full bath on main level, and Able to live on main level  with bedroom and bathroom Stairs: Yes: Internal: 16 steps; on left going up and External: 4 steps; on right going up Has following equipment at home: Single point cane, Walker - 2 wheeled, Wheelchair (manual), and shower chair  PLOF: Independent with household mobility with device and Independent with community mobility with device  PATIENT GOALS: walk with prosthesis, stand & transfer.   OBJECTIVE:  COGNITION: Overall cognitive status: History of cognitive impairments - at baseline  POSTURE: rounded shoulders, forward head, decreased lumbar lordosis, increased thoracic kyphosis, and flexed trunk   LOWER EXTREMITY ROM: Eval / 01/05/2023:    BLEs PROM WFL except unable to assess hip ext  (Blank rows = not tested)  LOWER EXTREMITY MMT:  MMT Right eval Left eval  Hip flexion 3-/5   Hip extension 3-/5   Hip abduction 3-/5   Hip adduction    Hip internal rotation    Hip external rotation    Knee flexion 3-/5   Knee extension 3-/5   Ankle dorsiflexion NA   Ankle plantarflexion NA   Ankle inversion NA   Ankle eversion NA   Eval / 01/05/2023: left LE & UE hemiparesis.  In gait slides to advance LLE and knee / hip instability in stance.  (Blank rows = not tested)  TRANSFERS: 02/07/2023: Sit to stand w/c or 18" chair with armrests to RW with modA. Stand to sit with minA. Both require constant cues. Stand-pivot transfer with RW with modA / constant cueing. (2nd person for safety)   01/05/2023 / Eval: Sit to stand: Max A pulling BUEs on //bars from w/c Stand to sit: Min A using BUEs //bars to control descent Lateral scoot transfers: Mod A w/c to / from mat table level & adjacent   GAIT: 02/07/2023: Pt amb 5' with RW with +2 modA including 90* turn to chair.   01/05/2023 / Eval: Gait pattern: step to pattern, decreased step length- Right, decreased step length- Left, decreased stance time- Right, decreased stance time- Left, decreased hip/knee flexion- Left, decreased ankle  dorsiflexion- Left, Right hip hike, Left hip hike, knee flexed in stance- Left, antalgic, lateral hip instability, trunk flexed, wide BOS, and poor foot clearance- Left Distance walked: 3' Assistive device utilized: // bars & RLE TTA prosthesis Level of assistance: Total  A / 2 person Comments:  LLE with poor clearance / slides foot forward and knee / hip instability in stance.   FUNCTIONAL TESTs:  Standing in //bars for 30 sec with modA.    CURRENT PROSTHETIC WEAR ASSESSMENT: Eval / 01/05/2023:  Patient is dependent with: skin check, residual limb care, care of non-amputated limb, prosthetic cleaning, ply sock cleaning, correct ply sock adjustment, proper wear schedule/adjustment, and proper weight-bearing schedule/adjustment Donning prosthesis: Total A / family & CNA requires 100% cueing Doffing prosthesis: Total A / family & CNA requires 100% cueing Prosthetic wear tolerance: 1-1.5 hours, 1x/day, 6 of 7 days since delivery Prosthetic weight bearing tolerance: 3 minutes during eval with c/o knee pain Edema: pitting Residual limb condition: lateral incision with 3mm X 6mm long scabs with serous drainage, arrived with bandaid over wound,  dry skin, normal temperature & color, cylindrical shape Prosthetic description: silicon liner with pin lock suspension, total contact socket, SACH foot K code/activity level with prosthetic use: Level 1    TODAY'S TREATMENT:                                                                                                                             DATE:  02/07/2023: Prosthetic Training with TTA prosthesis Sit to stand with RW and patient pushing off wheelchair with mod assist, requires cues for set up, hand placement, and sequence, X 6 reps total Stand pivot transfers from Wheelchair to standard chair with arms placed 90 deg using RW with modA (2nd person for safety).  PT demo & verbal cues for technique prior & during.  Patient ambulated 5 feet X 2 with  rolling walker walking to 2nd chair 90* turn to position to sit down (1st to right & 2nd to left) with mod assist +2 person. PT demo & verbal cues on donning including managing distal 3-4" soft tissue beyond tibia. Pt's husband verbalized understanding.  PT recommended increasing wear to 5 hours 2x/day (only 1x on dialysis days).  If no issues today to Sunday (5 days) then wear prosthesis to dialysis Monday. Don prior to leaving house and doff upon return for 2-3 hours. Wear 2nd time after break / nap. 2kg weight of prosthesis needs to be subtracted from weigh-in & weigh-out of dialysis. Pt & husband verbalize understanding.    02/02/2023: Prosthetic Training with TTA prosthesis Sit to stand with RW and patient pushing off wheelchair with mod assist, requires cues for set up, hand placement, and sequence, X 5 reps total Patient ambulated 5 feet X 2 with rolling walker with max assist (second person for safety (husband) and one extra person for chair follow (CNA)). Stand pivot transfers from Wheelchair to standard chair with arms placed 90 deg to simulate toilet transfer. Transferred to chair and back with mod A with max cues for sequence for foot placement, where to push up from, RW management, and to shift weight fully onto one leg to lift and turn on other  leg, then to take steps back until she touches the chair with the back of her legs before sitting.  01/31/2023: Prosthetic Training with TTA prosthesis Sit to stand in // bars with patient pushing off wheelchair with mod assist. PT recommended increasing prosthesis wear to 4 hours 2 times a day this week, 5 hours 2 times a day next week, 6 hours 2 times a day the following week and all day the fourth week.  When she is wearing the prosthesis for 5 hours 2 times a day then she can begin wearing the prosthesis to dialysis.  The weight of the prosthesis which is 2 kg needs to be subtracted from her weight and and weigh out.  If they adjust her dry weight  in the future the weight of the prosthesis should not be included in this weight.  Patient and husband verbalized understanding Patient's husband verbalizes proper donning and reports CNA's are able to don the prosthesis correctly.  He also reports proper cleaning of the prosthesis. Patient ambulated 5 feet in parallel bars with 1 person max assist.  Patient reports severe pain with weightbearing on prosthesis. PT demo and verbal cues on sit to and from stand technique.  Patient able to perform sit to / from stand transfer from wheelchair to rolling walker with mod assist and constant cueing.  Patient able to stand with rolling walker for 30 seconds with min assist Patient ambulated 5 feet with rolling walker with max assist (second person for safety).   PATIENT EDUCATION: PATIENT EDUCATED ON FOLLOWING PROSTHETIC CARE: Education details: Use of Vivewear under liner with prosthesis wear, use of shrinker at all times out of prosthesis,   Skin check, Residual limb care, Prosthetic cleaning, Correct ply sock adjustment, Propper donning, and Proper wear schedule/adjustment Prosthetic wear tolerance: 2 hours 2x/day, 7 days/week Person educated: Patient, Spouse, and Caregiver CNA Education method: Explanation, Demonstration, Tactile cues, and Verbal cues Education comprehension: verbalized understanding, verbal cues required, tactile cues required, and needs further education  HOME EXERCISE PROGRAM:  ASSESSMENT:  CLINICAL IMPRESSION: Patient had less pain expressions today.  She improved stand-pivot transfers with RW including turning with walking to position.  Pt continues to benefit from PT to improve function & safety.   OBJECTIVE IMPAIRMENTS: Abnormal gait, decreased activity tolerance, decreased balance, decreased cognition, decreased coordination, decreased endurance, decreased knowledge of condition, decreased knowledge of use of DME, decreased mobility, difficulty walking, decreased ROM,  decreased strength, postural dysfunction, prosthetic dependency , obesity, and pain.   ACTIVITY LIMITATIONS: standing, stairs, transfers, locomotion level, and safe prosthesis use.  PARTICIPATION LIMITATIONS: community activity, church, and household mobility  PERSONAL FACTORS: Age, Fitness, Past/current experiences, Time since onset of injury/illness/exacerbation, and 3+ comorbidities: see PMH  are also affecting patient's functional outcome.   REHAB POTENTIAL: Good  CLINICAL DECISION MAKING: Evolving/moderate complexity  EVALUATION COMPLEXITY: Moderate   GOALS: Goals reviewed with patient? Yes  SHORT TERM GOALS: Target date: 02/02/2023  Family / CNA donnes prosthesis correctly & verbalizes proper cleaning. Baseline: SEE OBJECTIVE DATA Goal status: MET 01/31/2023 2.  Patient tolerates prosthesis >6 hrs total /day without increase in skin issues or limb / knee pain <7/10 with standing. Baseline: SEE OBJECTIVE DATA Goal status: MET 02/07/2023  3.  Patient able to stand in //bars with CGA for 60 seconds.  Baseline: SEE OBJECTIVE DATA Goal status: MET 01/31/2023  4. Patient ambulates // bars & prosthesis with maxA of 1 person. Baseline: SEE OBJECTIVE DATA Goal status: MET 01/31/2023  New short term  goals target date 03/04/23  .1 Patient ambulates 10 feet with RW max A of 1 person with wheelchair follow if necessary Baseline: 5 feet max A+2 Goal status: new goal  2. Pt will be able to complete stand pivot transfers with RW mod A of one person. Baseline: max A  Goal status: NEW   LONG TERM GOALS: Target date: 04/04/2023  Patient & family / CNA demonstrates & verbalized understanding of prosthetic care to enable safe utilization of prosthesis. Baseline: SEE OBJECTIVE DATA Goal status: ongoing 01/31/2023  Patient tolerates prosthesis wear >80% of awake hours without skin or limb pain issues. Baseline: SEE OBJECTIVE DATA Goal status: ongoing 01/31/2023  Stand balance with walker:  static 2 min with supervision to enable ability for standing ADLS. Baseline: SEE OBJECTIVE DATA Goal status: ongoing 01/31/2023  Patient ambulates >50' with RW & prosthesis only with family / CNA assist safely.  Baseline: SEE OBJECTIVE DATA Goal status: ongoing 01/31/2023  Patient transfers scooting with supervision and sit to/from stand with minA w/c to RW.  Baseline: SEE OBJECTIVE DATA Goal status: ongoing 01/31/2023   PLAN:  PT FREQUENCY: 2x/week  PT DURATION: 13 weeks / 90 days  PLANNED INTERVENTIONS: Therapeutic exercises, Therapeutic activity, Neuromuscular re-education, Balance training, Gait training, Patient/Family education, Self Care, Vestibular training, Prosthetic training, DME instructions, and Manual therapy  PLAN FOR NEXT SESSION: do 10th visit progress note, continue gait and transfers including stand pivot with rolling walker, work on standing balance and gait with rolling walker.

## 2023-02-09 ENCOUNTER — Other Ambulatory Visit: Payer: Self-pay

## 2023-02-09 ENCOUNTER — Encounter (HOSPITAL_BASED_OUTPATIENT_CLINIC_OR_DEPARTMENT_OTHER): Payer: Self-pay

## 2023-02-09 ENCOUNTER — Inpatient Hospital Stay (HOSPITAL_BASED_OUTPATIENT_CLINIC_OR_DEPARTMENT_OTHER)
Admission: EM | Admit: 2023-02-09 | Discharge: 2023-02-14 | DRG: 291 | Disposition: A | Discharge status: Home Health | Payer: Medicare PPO | Attending: Internal Medicine | Admitting: Internal Medicine

## 2023-02-09 ENCOUNTER — Encounter: Payer: Medicare PPO | Admitting: Physical Therapy

## 2023-02-09 ENCOUNTER — Encounter (INDEPENDENT_AMBULATORY_CARE_PROVIDER_SITE_OTHER): Payer: Medicare PPO | Admitting: Ophthalmology

## 2023-02-09 ENCOUNTER — Emergency Department (HOSPITAL_BASED_OUTPATIENT_CLINIC_OR_DEPARTMENT_OTHER): Payer: Medicare PPO

## 2023-02-09 DIAGNOSIS — E1122 Type 2 diabetes mellitus with diabetic chronic kidney disease: Secondary | ICD-10-CM

## 2023-02-09 DIAGNOSIS — A0472 Enterocolitis due to Clostridium difficile, not specified as recurrent: Secondary | ICD-10-CM | POA: Diagnosis present

## 2023-02-09 DIAGNOSIS — R0600 Dyspnea, unspecified: Secondary | ICD-10-CM | POA: Insufficient documentation

## 2023-02-09 DIAGNOSIS — E1151 Type 2 diabetes mellitus with diabetic peripheral angiopathy without gangrene: Secondary | ICD-10-CM | POA: Diagnosis present

## 2023-02-09 DIAGNOSIS — Z7902 Long term (current) use of antithrombotics/antiplatelets: Secondary | ICD-10-CM

## 2023-02-09 DIAGNOSIS — Z91014 Allergy to mammalian meats: Secondary | ICD-10-CM

## 2023-02-09 DIAGNOSIS — J189 Pneumonia, unspecified organism: Secondary | ICD-10-CM | POA: Diagnosis present

## 2023-02-09 DIAGNOSIS — N186 End stage renal disease: Secondary | ICD-10-CM

## 2023-02-09 DIAGNOSIS — Z7951 Long term (current) use of inhaled steroids: Secondary | ICD-10-CM

## 2023-02-09 DIAGNOSIS — R0603 Acute respiratory distress: Secondary | ICD-10-CM

## 2023-02-09 DIAGNOSIS — Z79899 Other long term (current) drug therapy: Secondary | ICD-10-CM

## 2023-02-09 DIAGNOSIS — I69391 Dysphagia following cerebral infarction: Secondary | ICD-10-CM

## 2023-02-09 DIAGNOSIS — I5032 Chronic diastolic (congestive) heart failure: Secondary | ICD-10-CM | POA: Diagnosis present

## 2023-02-09 DIAGNOSIS — N189 Chronic kidney disease, unspecified: Secondary | ICD-10-CM | POA: Diagnosis not present

## 2023-02-09 DIAGNOSIS — I6381 Other cerebral infarction due to occlusion or stenosis of small artery: Secondary | ICD-10-CM | POA: Diagnosis present

## 2023-02-09 DIAGNOSIS — Z888 Allergy status to other drugs, medicaments and biological substances status: Secondary | ICD-10-CM

## 2023-02-09 DIAGNOSIS — G40909 Epilepsy, unspecified, not intractable, without status epilepticus: Secondary | ICD-10-CM

## 2023-02-09 DIAGNOSIS — B348 Other viral infections of unspecified site: Secondary | ICD-10-CM | POA: Diagnosis present

## 2023-02-09 DIAGNOSIS — Z91010 Allergy to peanuts: Secondary | ICD-10-CM

## 2023-02-09 DIAGNOSIS — E785 Hyperlipidemia, unspecified: Secondary | ICD-10-CM | POA: Diagnosis present

## 2023-02-09 DIAGNOSIS — S88111D Complete traumatic amputation at level between knee and ankle, right lower leg, subsequent encounter: Secondary | ICD-10-CM

## 2023-02-09 DIAGNOSIS — I132 Hypertensive heart and chronic kidney disease with heart failure and with stage 5 chronic kidney disease, or end stage renal disease: Secondary | ICD-10-CM | POA: Diagnosis not present

## 2023-02-09 DIAGNOSIS — Z7982 Long term (current) use of aspirin: Secondary | ICD-10-CM

## 2023-02-09 DIAGNOSIS — D631 Anemia in chronic kidney disease: Secondary | ICD-10-CM | POA: Diagnosis present

## 2023-02-09 DIAGNOSIS — Z992 Dependence on renal dialysis: Secondary | ICD-10-CM

## 2023-02-09 DIAGNOSIS — E119 Type 2 diabetes mellitus without complications: Secondary | ICD-10-CM

## 2023-02-09 DIAGNOSIS — Z91013 Allergy to seafood: Secondary | ICD-10-CM

## 2023-02-09 DIAGNOSIS — M898X9 Other specified disorders of bone, unspecified site: Secondary | ICD-10-CM | POA: Diagnosis present

## 2023-02-09 DIAGNOSIS — J440 Chronic obstructive pulmonary disease with acute lower respiratory infection: Secondary | ICD-10-CM | POA: Diagnosis present

## 2023-02-09 DIAGNOSIS — Z881 Allergy status to other antibiotic agents status: Secondary | ICD-10-CM

## 2023-02-09 DIAGNOSIS — Z820 Family history of epilepsy and other diseases of the nervous system: Secondary | ICD-10-CM

## 2023-02-09 DIAGNOSIS — K649 Unspecified hemorrhoids: Secondary | ICD-10-CM | POA: Diagnosis present

## 2023-02-09 DIAGNOSIS — I509 Heart failure, unspecified: Secondary | ICD-10-CM

## 2023-02-09 DIAGNOSIS — I5033 Acute on chronic diastolic (congestive) heart failure: Secondary | ICD-10-CM | POA: Diagnosis present

## 2023-02-09 DIAGNOSIS — S88111A Complete traumatic amputation at level between knee and ankle, right lower leg, initial encounter: Secondary | ICD-10-CM | POA: Diagnosis present

## 2023-02-09 DIAGNOSIS — R531 Weakness: Secondary | ICD-10-CM

## 2023-02-09 DIAGNOSIS — Z794 Long term (current) use of insulin: Secondary | ICD-10-CM

## 2023-02-09 DIAGNOSIS — Z833 Family history of diabetes mellitus: Secondary | ICD-10-CM

## 2023-02-09 DIAGNOSIS — Z89511 Acquired absence of right leg below knee: Secondary | ICD-10-CM

## 2023-02-09 DIAGNOSIS — K219 Gastro-esophageal reflux disease without esophagitis: Secondary | ICD-10-CM | POA: Diagnosis present

## 2023-02-09 DIAGNOSIS — Z91018 Allergy to other foods: Secondary | ICD-10-CM

## 2023-02-09 DIAGNOSIS — I2510 Atherosclerotic heart disease of native coronary artery without angina pectoris: Secondary | ICD-10-CM | POA: Diagnosis present

## 2023-02-09 LAB — CBC
HCT: 26.2 % — ABNORMAL LOW (ref 36.0–46.0)
Hemoglobin: 8.8 g/dL — ABNORMAL LOW (ref 12.0–15.0)
MCH: 29.2 pg (ref 26.0–34.0)
MCHC: 33.6 g/dL (ref 30.0–36.0)
MCV: 87 fL (ref 80.0–100.0)
Platelets: 252 10*3/uL (ref 150–400)
RBC: 3.01 MIL/uL — ABNORMAL LOW (ref 3.87–5.11)
RDW: 18.3 % — ABNORMAL HIGH (ref 11.5–15.5)
WBC: 11.5 10*3/uL — ABNORMAL HIGH (ref 4.0–10.5)
nRBC: 0 % (ref 0.0–0.2)

## 2023-02-09 LAB — PROCALCITONIN: Procalcitonin: 0.63 ng/mL

## 2023-02-09 LAB — BASIC METABOLIC PANEL
Anion gap: 13 (ref 5–15)
BUN: 35 mg/dL — ABNORMAL HIGH (ref 8–23)
CO2: 28 mmol/L (ref 22–32)
Calcium: 8.7 mg/dL — ABNORMAL LOW (ref 8.9–10.3)
Chloride: 97 mmol/L — ABNORMAL LOW (ref 98–111)
Creatinine, Ser: 6 mg/dL — ABNORMAL HIGH (ref 0.44–1.00)
GFR, Estimated: 7 mL/min — ABNORMAL LOW (ref >60)
Glucose, Bld: 125 mg/dL — ABNORMAL HIGH (ref 70–99)
Potassium: 4.3 mmol/L (ref 3.5–5.1)
Sodium: 138 mmol/L (ref 135–145)

## 2023-02-09 LAB — BRAIN NATRIURETIC PEPTIDE: B Natriuretic Peptide: 331.1 pg/mL — ABNORMAL HIGH (ref 0.0–100.0)

## 2023-02-09 MED ORDER — SODIUM CHLORIDE 0.9 % IV SOLN
1.0000 g | Freq: Once | INTRAVENOUS | Status: AC
  Administered 2023-02-09: 1 g via INTRAVENOUS
  Filled 2023-02-09: qty 10

## 2023-02-09 MED ORDER — LOSARTAN POTASSIUM 50 MG PO TABS
50.0000 mg | ORAL_TABLET | Freq: Two times a day (BID) | ORAL | Status: DC
  Administered 2023-02-09 – 2023-02-14 (×9): 50 mg via ORAL
  Filled 2023-02-09 (×10): qty 1

## 2023-02-09 MED ORDER — MOMETASONE FURO-FORMOTEROL FUM 200-5 MCG/ACT IN AERO
2.0000 | INHALATION_SPRAY | Freq: Two times a day (BID) | RESPIRATORY_TRACT | Status: DC
  Administered 2023-02-10 – 2023-02-13 (×7): 2 via RESPIRATORY_TRACT
  Filled 2023-02-09: qty 8.8

## 2023-02-09 MED ORDER — SEVELAMER CARBONATE 0.8 G PO PACK
0.8000 g | PACK | Freq: Three times a day (TID) | ORAL | Status: DC
  Administered 2023-02-10 – 2023-02-14 (×11): 0.8 g via ORAL
  Filled 2023-02-09 (×16): qty 1

## 2023-02-09 MED ORDER — ACETAMINOPHEN 500 MG PO TABS: 1000.0000 mg | ORAL_TABLET | Freq: Two times a day (BID) | ORAL | Status: DC | PRN

## 2023-02-09 MED ORDER — SODIUM CHLORIDE 0.9 % IV SOLN: 500.0000 mg | INTRAVENOUS | Status: DC

## 2023-02-09 MED ORDER — CARVEDILOL 3.125 MG PO TABS
3.1250 mg | ORAL_TABLET | Freq: Two times a day (BID) | ORAL | Status: DC
  Administered 2023-02-09 – 2023-02-14 (×9): 3.125 mg via ORAL
  Filled 2023-02-09 (×10): qty 1

## 2023-02-09 MED ORDER — SODIUM CHLORIDE 0.9 % IV SOLN
500.0000 mg | Freq: Once | INTRAVENOUS | Status: AC
  Administered 2023-02-09: 500 mg via INTRAVENOUS
  Filled 2023-02-09: qty 5

## 2023-02-09 MED ORDER — HYDRALAZINE HCL 25 MG PO TABS
25.0000 mg | ORAL_TABLET | Freq: Two times a day (BID) | ORAL | Status: DC
  Administered 2023-02-09 – 2023-02-14 (×9): 25 mg via ORAL
  Filled 2023-02-09 (×10): qty 1

## 2023-02-09 MED ORDER — IPRATROPIUM-ALBUTEROL 0.5-2.5 (3) MG/3ML IN SOLN
3.0000 mL | RESPIRATORY_TRACT | Status: DC | PRN
  Administered 2023-02-09 – 2023-02-12 (×2): 3 mL via RESPIRATORY_TRACT
  Filled 2023-02-09 (×2): qty 3

## 2023-02-09 MED ORDER — DICLOFENAC SODIUM 1 % EX GEL
2.0000 g | Freq: Four times a day (QID) | CUTANEOUS | Status: DC
  Administered 2023-02-09 – 2023-02-14 (×15): 2 g via TOPICAL
  Filled 2023-02-09: qty 100

## 2023-02-09 MED ORDER — IPRATROPIUM-ALBUTEROL 0.5-2.5 (3) MG/3ML IN SOLN
3.0000 mL | Freq: Once | RESPIRATORY_TRACT | Status: AC
  Administered 2023-02-09: 3 mL via RESPIRATORY_TRACT
  Filled 2023-02-09: qty 3

## 2023-02-09 MED ORDER — LEVETIRACETAM 250 MG PO TABS
375.0000 mg | ORAL_TABLET | ORAL | Status: DC
  Administered 2023-02-10 – 2023-02-13 (×2): 375 mg via ORAL
  Filled 2023-02-09 (×2): qty 1.5

## 2023-02-09 MED ORDER — PANTOPRAZOLE SODIUM 40 MG PO TBEC
40.0000 mg | DELAYED_RELEASE_TABLET | Freq: Every day | ORAL | Status: DC
  Administered 2023-02-10 – 2023-02-13 (×4): 40 mg via ORAL
  Filled 2023-02-09 (×4): qty 1

## 2023-02-09 MED ORDER — ROSUVASTATIN CALCIUM 20 MG PO TABS
20.0000 mg | ORAL_TABLET | Freq: Every day | ORAL | Status: DC
  Administered 2023-02-09 – 2023-02-14 (×5): 20 mg via ORAL
  Filled 2023-02-09 (×6): qty 1

## 2023-02-09 MED ORDER — LEVETIRACETAM 750 MG PO TABS
750.0000 mg | ORAL_TABLET | Freq: Every day | ORAL | Status: DC
  Administered 2023-02-09 – 2023-02-13 (×5): 750 mg via ORAL
  Filled 2023-02-09 (×6): qty 1

## 2023-02-09 MED ORDER — SODIUM CHLORIDE 0.9 % IV SOLN: 1.0000 g | INTRAVENOUS | Status: DC

## 2023-02-09 MED ORDER — ASPIRIN 81 MG PO TBEC
81.0000 mg | DELAYED_RELEASE_TABLET | Freq: Every day | ORAL | Status: DC
  Administered 2023-02-09 – 2023-02-14 (×5): 81 mg via ORAL
  Filled 2023-02-09 (×6): qty 1

## 2023-02-09 MED ORDER — HEPARIN SODIUM (PORCINE) 5000 UNIT/ML IJ SOLN
5000.0000 [IU] | Freq: Three times a day (TID) | INTRAMUSCULAR | Status: DC
  Administered 2023-02-09 – 2023-02-14 (×13): 5000 [IU] via SUBCUTANEOUS
  Filled 2023-02-09 (×14): qty 1

## 2023-02-09 MED ORDER — HYDROCORTISONE ACETATE 25 MG RE SUPP
25.0000 mg | Freq: Two times a day (BID) | RECTAL | Status: DC
  Administered 2023-02-09 – 2023-02-13 (×8): 25 mg via RECTAL
  Filled 2023-02-09 (×11): qty 1

## 2023-02-09 MED ORDER — INSULIN ASPART 100 UNIT/ML IJ SOLN
0.0000 [IU] | Freq: Three times a day (TID) | INTRAMUSCULAR | Status: DC
  Administered 2023-02-10: 2 [IU] via SUBCUTANEOUS
  Administered 2023-02-12 (×2): 1 [IU] via SUBCUTANEOUS
  Administered 2023-02-12: 3 [IU] via SUBCUTANEOUS
  Administered 2023-02-13 – 2023-02-14 (×2): 2 [IU] via SUBCUTANEOUS
  Administered 2023-02-14: 1 [IU] via SUBCUTANEOUS
  Administered 2023-02-14: 2 [IU] via SUBCUTANEOUS

## 2023-02-09 MED ORDER — METHYLPREDNISOLONE SODIUM SUCC 125 MG IJ SOLR
125.0000 mg | Freq: Once | INTRAMUSCULAR | Status: AC
  Administered 2023-02-09: 125 mg via INTRAVENOUS
  Filled 2023-02-09: qty 2

## 2023-02-09 MED ORDER — TICAGRELOR 90 MG PO TABS
90.0000 mg | ORAL_TABLET | Freq: Two times a day (BID) | ORAL | Status: DC
  Administered 2023-02-09 – 2023-02-14 (×9): 90 mg via ORAL
  Filled 2023-02-09 (×10): qty 1

## 2023-02-09 NOTE — H&P (Signed)
History and Physical    Patient: Carolyn Russo:[id number] DOB: 11-03-1950 DOA: 02/09/2023 DOS: the patient was seen and examined on 02/09/2023 PCP: Dorothyann Peng, MD  Patient coming from: Home  Chief Complaint:  Chief Complaint  Patient presents with   Cough   HPI: Carolyn Russo is a 72 y.o. female with medical history significant of ESRD (MWF), diastolic congestive heart failure, type 2 diabetes, hypertension, CVA, peripheral artery disease who presents for 4 to 5 days of productive coughing with yellow sputum and shortness of breath that has been worsening.  At first, she thought she was having allergy symptoms.  She used her home nebulizer with some relief.  Endorses chest tightness and wheezing.  Feels like she cannot get enough air in her lungs to talk.  States that she is "a big talker".  She has to sleep sitting up right. Denies fever, lower leg swelling, palpitations, new upper extremity numbness and tingling.  Denies history of smoking.  Upon arrival to the ED she was mildly hypertensive, tachypneic but satting well on room air.  BMP is grossly changed from previous.  Mild leukocytosis, WBC 11.5.  She had normocytic anemia, hemoglobin 8.8.  BNP 331.  Procalcitonin 0.63.  Chest x-ray showing questionable left lung opacity and mild central vascular congestion.  Patient given DuoNebs x 2, Solu-Medrol 125 mg, ceftriaxone and azithromycin.   Review of Systems: As mentioned in the history of present illness. All other systems reviewed and are negative. Past Medical History:  Diagnosis Date   Acute GI bleeding    Allergy    Anemia    Anterior chest wall pain    Appendicitis 1965   Asthma    Body mass index 37.0-37.9, adult    Breast pain    Cataract    both eyes   CHF (congestive heart failure)    Chronic kidney disease    stage 5 - on dialysis   Cognitive change 04/20/2021   r/t cva 03/2821   Complication of anesthesia    Memory loss after general   Dehydration 2014    Deviated septum 1971   Diabetes mellitus    Dysphagia due to old stroke    easy to get strangled when eating   Dyspnea 2014   Extrinsic asthma    WITH ASTHMA ATTACK   Fibroid 1980   GERD (gastroesophageal reflux disease)    Heart murmur    History of migraine    History of seizure    with stroke   Hx gestational diabetes    Hyperlipidemia    Hypertension 2014   Inguinal hernia 1959   Malaise and fatigue 2014   Non-IgE mediated allergic asthma 2014   Obesity    Pelvic pain    Pregnancy, high-risk 1985   Stroke 04/20/2021   (CVA) of right basal ganglia   Tonsillitis 1968   Uterine fibroid 1980   Visual field defect    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   Past Surgical History:  Procedure Laterality Date   ABDOMINAL AORTOGRAM W/LOWER EXTREMITY N/A 02/21/2022   Procedure: ABDOMINAL AORTOGRAM W/LOWER EXTREMITY;  Surgeon: Maeola Harman, MD;  Location: [facility] INVASIVE CV LAB;  Service: Cardiovascular;  Laterality: N/A;   AMPUTATION Right 05/18/2022   Procedure: RIGHT BELOW KNEE AMPUTATION;  Surgeon: Nadara Mustard, MD;  Location: [facility] OR;  Service: Orthopedics;  Laterality: Right;   AMPUTATION TOE Right 04/15/2022   Procedure: AMPUTATION  LST TOERIGHT FOOT;  Surgeon: Edwin Cap, DPM;  Location:  [facility] ORS;  Service: Podiatry;  Laterality: Right;   APPENDECTOMY  1959   BASCILIC VEIN TRANSPOSITION Right 04/30/2021   Procedure: RIGHT FIRST STAGE BASCILIC VEIN TRANSPOSITION;  Surgeon: Chuck Hint, MD;  Location: [facility] OR;  Service: Vascular;  Laterality: Right;   CESAREAN SECTION  1985   COLONOSCOPY     ESOPHAGOGASTRODUODENOSCOPY (EGD) WITH PROPOFOL N/A 04/18/2021   Procedure: ESOPHAGOGASTRODUODENOSCOPY (EGD) WITH PROPOFOL;  Surgeon: Iva Boop, MD;  Location: [facility] ENDOSCOPY;  Service: Endoscopy;  Laterality: N/A;   EYE SURGERY     bilateral cataract    FLEXIBLE SIGMOIDOSCOPY N/A 04/18/2021   Procedure: FLEXIBLE SIGMOIDOSCOPY;  Surgeon: Iva Boop, MD;  Location: [facility] ENDOSCOPY;   Service: Endoscopy;  Laterality: N/A;   HERNIA REPAIR  1959   IR FLUORO GUIDE CV LINE RIGHT  03/18/2021   IR US GUIDE VASC ACCESS RIGHT  03/18/2021   LEFT HEART CATH AND CORONARY ANGIOGRAPHY N/A 04/04/2017   Procedure: Left Heart Cath and Coronary Angiography;  Surgeon: Lyn Records, MD;  Location: [facility] INVASIVE CV LAB;  Service: Cardiovascular;  Laterality: N/A;   MYOMECTOMY  1980, 2004, 2007   PERIPHERAL VASCULAR THROMBECTOMY  02/21/2022   Procedure: PERIPHERAL VASCULAR THROMBECTOMY;  Surgeon: Maeola Harman, MD;  Location: [facility] INVASIVE CV LAB;  Service: Cardiovascular;;  Right Tibial   RHINOPLASTY  1971   ROTATOR CUFF REPAIR  2003   SURGICAL REPAIR OF HEMORRHAGE  2015   TONSILLECTOMY  1968   Social History:  reports that she has never smoked. She has never used smokeless tobacco. She reports that she does not drink alcohol and does not use drugs.  Allergies  Allergen Reactions   Food Anaphylaxis    Peanuts - anaphylaxis Almonds - anaphylaxis Not listed on MAR   Statins Itching and Other (See Comments)    Generalized aches- tolerates crestor  Not listed on MAR   Gadolinium Derivatives Other (See Comments)    Gadolinium-Containing Contrast Media Listed as an allergy on MAR Unknown reaction   Januvia [Sitagliptin] Other (See Comments)    Listed as an allergy on MAR Unknown reaction   Pork-Derived Products Other (See Comments)    Does not eat pork  Listed on MAR   Shellfish Allergy Other (See Comments)    Mouth gets raw Listed on MAR   Tetracycline Other (See Comments)    Raw mouth Not listed on MAR    Family History  Problem Relation Age of Onset   Cancer Mother        abdominal melamona   Psoriasis Mother    Alzheimer's disease Father    Cancer Cousin        colon    Diabetes Cousin    Diabetes Maternal Aunt    Colon cancer Neg Hx    Colon polyps Neg Hx    Esophageal cancer Neg Hx    Rectal cancer Neg Hx    Stomach cancer Neg Hx    Breast cancer Neg Hx      Prior to Admission medications   Medication Sig Start Date End Date Taking? Authorizing Provider  acetaminophen (TYLENOL) 500 MG tablet Take 1,000 mg by mouth 2 (two) times daily as needed (leg and arm pain).   Yes [provider]  aspirin EC 81 MG tablet Take 1 tablet (81 mg total) by mouth daily. Swallow whole. 06/10/22  Yes Willeen Niece, MD  carvedilol (COREG) 3.125 MG tablet Take 1 tablet (3.125 mg total) by mouth 2 (two) times daily  with a meal. 11/01/21  Yes Dorothyann Peng, MD  diclofenac Sodium (VOLTAREN) 1 % GEL Apply 2 g topically 4 (four) times daily. To left wrist 05/26/21  Yes Love, Evlyn Kanner, PA-C  guaifenesin (ROBITUSSIN) 100 MG/5ML syrup Take 200 mg by mouth 3 (three) times daily as needed for cough.   Yes [provider]  hydrALAZINE (APRESOLINE) 25 MG tablet Take 1 tablet (25 mg total) by mouth in the morning and at bedtime. 01/10/23  Yes Chilton Si, MD  insulin aspart (NOVOLOG) 100 UNIT/ML injection Inject 0-6 Units into the skin 3 (three) times daily with meals. CBG < 70: Implement Hypoglycemia Standing Orders and refer to Hypoglycemia Standing Orders sidebar report CBG 70 - 120: 0 units CBG 121 - 150: 0 units CBG 151 - 200: 1 unit CBG 201-250: 2 units CBG 251-300: 3 units CBG 301-350: 4 units CBG 351-400: 5 units CBG > 400: Give 6 units and call MD 06/29/22  Yes Pokhrel, Laxman, MD  insulin glargine (LANTUS) 100 UNIT/ML Solostar Pen Inject 5 Units into the skin at bedtime. 06/29/22  Yes Pokhrel, Rebekah Chesterfield, MD  levETIRAcetam (KEPPRA) 250 MG tablet Take 1.5 tablets (375 mg total) by mouth every Monday, Wednesday, and Friday at 6 PM. 06/29/22  Yes Pokhrel, Laxman, MD  levETIRAcetam (KEPPRA) 750 MG tablet Take 1 tablet (750 mg total) by mouth at bedtime. 06/29/22  Yes Pokhrel, Laxman, MD  loratadine (CLARITIN) 10 MG tablet Take 10 mg by mouth daily.   Yes [provider]  losartan (COZAAR) 50 MG tablet Take 50 mg by mouth 2 (two) times daily.   Yes  [provider]  methylphenidate (RITALIN) 5 MG tablet Take 5 mg by mouth daily.   Yes [provider]  mometasone-formoterol (DULERA) 200-5 MCG/ACT AERO Inhale 2 puffs into the lungs 2 (two) times daily. 04/15/22  Yes Dorothyann Peng, MD  pantoprazole (PROTONIX) 40 MG tablet Take 1 tablet (40 mg total) by mouth daily. Patient taking differently: Take 40 mg by mouth at bedtime. 02/07/22  Yes Dorothyann Peng, MD  rosuvastatin (CRESTOR) 20 MG tablet Take 1 tablet (20 mg total) by mouth daily. Patient taking differently: Take 20 mg by mouth at bedtime. 08/18/21  Yes Ghumman, Ramandeep, NP  senna-docusate (SENOKOT-S) 8.6-50 MG tablet Take 2 tablets by mouth 2 (two) times daily. 06/29/21  Yes Ghumman, Ramandeep, NP  sevelamer carbonate (RENVELA) 0.8 g PACK packet Take 0.8 g by mouth 3 (three) times daily. 10/19/21  Yes [provider]  ticagrelor (BRILINTA) 90 MG TABS tablet Take 1 tablet (90 mg total) by mouth 2 (two) times daily. 06/10/22  Yes Khatri, Alverda Skeans, MD  Glucagon, rDNA, (GLUCAGON EMERGENCY) 1 MG KIT Inject 1 mg into the vein as needed (CBG of 65mg /dL).    [provider]  hydrOXYzine (VISTARIL) 25 MG capsule Take 1 capsule (25 mg total) by mouth 3 (three) times daily as needed. Patient not taking: Reported on 01/10/2023 12/09/22   Genice Rouge, MD    Physical Exam: Vitals:   02/09/23 1700 02/09/23 1739 02/09/23 2043 02/09/23 2103  BP: (!) 182/145  (!) 177/72   Pulse: 79  92   Resp:   (!) 22   Temp:  99.1 F (37.3 C) 98.3 F (36.8 C)   TempSrc:  Oral Oral   SpO2: 92%  97% 97%   GEN:     alert, pleasant elderly female    HENT:  mucus membranes moist, nares patent, no nasal discharge  EYES:   pupils equal and  reactive, EOM intact NECK:  supple, normal ROM, no lymphadenopathy  RESP:  Tachypneic, no retractions,  faint scattered expiratory wheezing, frequently coughing   CVS:   regular rate and rhythm,  distal pulses intact, right tunneled  catheter. ABD:  soft, non-tender; bowel sounds present; no palpable masses,   EXT:   Right BKA, baseline ROM of LLE, no edema appreciated  NEURO:  speech normal, alert and oriented at baseline    Skin:   warm and dry, no rash, normal skin turgor Psych: Normal affect, appropriate speech and behavior     Data Reviewed:  Relevant notes from primary care and specialist visits, past discharge summaries as available in EHR, including Care Everywhere. Prior diagnostic testing as pertinent to current admission diagnoses Updated medications and problem lists for reconciliation ED course, including vitals, labs, imaging, treatment and response to treatment Triage notes, nursing and pharmacy notes and ED provider's notes Notable results as noted in HPI  Assessment and Plan: Principal Problem:   Acute respiratory distress Active Problems:   Anemia in chronic kidney disease   ESRD on dialysis   Type 2 diabetes mellitus   CAD (coronary artery disease), native coronary artery   Chronic diastolic CHF (congestive heart failure)   Hemorrhoids   Cerebrovascular accident (CVA) of right basal ganglia   Below-knee amputation of right lower extremity   Seizure disorder   Acute respiratory distress  Chest x-ray with questionable left lower lobe opacity with mild leukocytosis.  Patient started on empiric treatment for community-acquired pneumonia (azithromycin and ceftriaxone).  She does also have a mildly elevated BNP vascular congestion seen on chest x-ray. On chart review, has history of non-obstructive COPD.   - Consider repeat CXR in AM  - Continue IV antibiotics (CTX and Azithro)  - s/p SoluMedrol 125 mg  - Consider AM Solumedrol if not improving  - Check for strep pneumonia and legionella urinary antigen.  - COVID test ordered  - PRN DuoNebs   End-stage renal disease  Last dialyzed yesterday on her MWF outpatient schedule -Nephrology consulted; Dr. Allena Katz will coordinate HD -HD per  nephrology   Diastolic congestive Heat Failure  BNP 330 on admission.  Chest x-ray without cardiomegaly or pleural effusion.  Appears euvolemic on exam.  Follows with Dr. Chilton Si.  Patient reporting worsening shortness of breath, orthopnea, and chest tightness.  Echocardiogram from 06/12/2022 showed LVEF with 6065% with grade 1 diastolic dysfunction - Consider repeat ECHO - volume management with HD  - vitals per unit routine - strict I's and O's  - daily weights - continuous pulse ox - am RFP, CBC  Anemia of chronic disease Hemoglobin 8.8 on admission. -Trend with CBC  Seizure disorder -Continue home Keppra  Hypertension She is hypertensive.   - Resume home losartan, hydralazine, Coreg   Type 2 diabetes Glucose on admission 125.  Most recent A1c <5 though I suspect this may be inaccurate due to ESRD. -Sliding scale insulin -Trend glucose  Hyperlipidemia -Continue home Crestor   CVA Patient had a CVA in 2022 of the right basal ganglia with left-sided deficits.  She has been getting outpatient physical therapy. -PT/OT eval and treat  CAD - continue home Crestor and Brilinta  Hemorrhoids  - Continue Anusol-HC BID    Advance Care Planning:   Code Status: Full Code   Consults: Nephrology   Family Communication: Called and spoke with husband (Dr Chalmers Guest)  Severity of Illness: The appropriate patient status for this patient is OBSERVATION. Observation status is  judged to be reasonable and necessary in order to provide the required intensity of service to ensure the patient's safety. The patient's presenting symptoms, physical exam findings, and initial radiographic and laboratory data in the context of their medical condition is felt to place them at decreased risk for further clinical deterioration. Furthermore, it is anticipated that the patient will be medically stable for discharge from the hospital within 2 midnights of admission.   Author: Katha Cabal, DO 02/09/2023 10:13 PM  For on call review [email or web address].

## 2023-02-09 NOTE — ED Triage Notes (Signed)
Patient here POV from Home.  Endorses a few days ago began to have Allergy-Like Symptoms such as Cough. Productive with Congestion noted.   ESRD: M/W/F. [employer]. Nebulizer Use at Home with some Relief.   NAD Noted during Triage. A&Ox4. Gcs 15. BIB Wheelchair.

## 2023-02-09 NOTE — ED Notes (Signed)
[facility] at bedside 

## 2023-02-09 NOTE — ED Provider Notes (Signed)
[place] EMERGENCY DEPARTMENT AT [facility] Provider Note   CSN: [id number] Arrival date & time: 02/09/23  1300     History  Chief Complaint  Patient presents with   Cough    Carolyn Russo is a 72 y.o. female with a past medical history of hypertension, type 2 diabetes, dialysis (MWF) who presents emergency department with concerns for cough.  He notes that the cough began several days ago and has been productive.  She goes to dialysis on Monday, Wednesday, Friday with her last treatment being on yesterday.  She notes that she received all but 15 minutes of her treatment.  Denies chest pain.  Denies past medical history of COPD or CHF.  The history is provided by the patient and the spouse. No language interpreter was used.       Home Medications Prior to Admission medications   Medication Sig Start Date End Date Taking? Authorizing Provider  acetaminophen (TYLENOL) 500 MG tablet Take 1,000 mg by mouth 2 (two) times daily as needed (leg and arm pain).   Yes [provider]  aspirin EC 81 MG tablet Take 1 tablet (81 mg total) by mouth daily. Swallow whole. 06/10/22  Yes Willeen Niece, MD  carvedilol (COREG) 3.125 MG tablet Take 1 tablet (3.125 mg total) by mouth 2 (two) times daily with a meal. 11/01/21  Yes Dorothyann Peng, MD  diclofenac Sodium (VOLTAREN) 1 % GEL Apply 2 g topically 4 (four) times daily. To left wrist 05/26/21  Yes Love, Evlyn Kanner, PA-C  guaifenesin (ROBITUSSIN) 100 MG/5ML syrup Take 200 mg by mouth 3 (three) times daily as needed for cough.   Yes [provider]  hydrALAZINE (APRESOLINE) 25 MG tablet Take 1 tablet (25 mg total) by mouth in the morning and at bedtime. 01/10/23  Yes Chilton Si, MD  insulin aspart (NOVOLOG) 100 UNIT/ML injection Inject 0-6 Units into the skin 3 (three) times daily with meals. CBG < 70: Implement Hypoglycemia Standing Orders and refer to Hypoglycemia Standing Orders sidebar report CBG 70 - 120: 0  units CBG 121 - 150: 0 units CBG 151 - 200: 1 unit CBG 201-250: 2 units CBG 251-300: 3 units CBG 301-350: 4 units CBG 351-400: 5 units CBG > 400: Give 6 units and call MD 06/29/22  Yes Pokhrel, Laxman, MD  insulin glargine (LANTUS) 100 UNIT/ML Solostar Pen Inject 5 Units into the skin at bedtime. 06/29/22  Yes Pokhrel, Rebekah Chesterfield, MD  levETIRAcetam (KEPPRA) 250 MG tablet Take 1.5 tablets (375 mg total) by mouth every Monday, Wednesday, and Friday at 6 PM. 06/29/22  Yes Pokhrel, Laxman, MD  levETIRAcetam (KEPPRA) 750 MG tablet Take 1 tablet (750 mg total) by mouth at bedtime. 06/29/22  Yes Pokhrel, Laxman, MD  loratadine (CLARITIN) 10 MG tablet Take 10 mg by mouth daily.   Yes [provider]  losartan (COZAAR) 50 MG tablet Take 50 mg by mouth 2 (two) times daily.   Yes [provider]  methylphenidate (RITALIN) 5 MG tablet Take 5 mg by mouth daily.   Yes [provider]  mometasone-formoterol (DULERA) 200-5 MCG/ACT AERO Inhale 2 puffs into the lungs 2 (two) times daily. 04/15/22  Yes Dorothyann Peng, MD  pantoprazole (PROTONIX) 40 MG tablet Take 1 tablet (40 mg total) by mouth daily. Patient taking differently: Take 40 mg by mouth at bedtime. 02/07/22  Yes Dorothyann Peng, MD  rosuvastatin (CRESTOR) 20 MG tablet Take 1 tablet (20 mg total) by mouth daily. Patient taking differently: Take 20  mg by mouth at bedtime. 08/18/21  Yes Ghumman, Ramandeep, NP  senna-docusate (SENOKOT-S) 8.6-50 MG tablet Take 2 tablets by mouth 2 (two) times daily. 06/29/21  Yes Ghumman, Ramandeep, NP  sevelamer carbonate (RENVELA) 0.8 g PACK packet Take 0.8 g by mouth 3 (three) times daily. 10/19/21  Yes [provider]  ticagrelor (BRILINTA) 90 MG TABS tablet Take 1 tablet (90 mg total) by mouth 2 (two) times daily. 06/10/22  Yes Willeen Niece, MD  Glucagon, rDNA, (GLUCAGON EMERGENCY) 1 MG KIT Inject 1 mg into the vein as needed (CBG of 65mg /dL).    [provider]  hydrOXYzine (VISTARIL)  25 MG capsule Take 1 capsule (25 mg total) by mouth 3 (three) times daily as needed. Patient not taking: Reported on 01/10/2023 12/09/22   Genice Rouge, MD      Allergies    Food, Statins, Gadolinium derivatives, Januvia [sitagliptin], Pork-derived products, Shellfish allergy, and Tetracycline    Review of Systems   Review of Systems  Respiratory:  Positive for cough.   All other systems reviewed and are negative.   Physical Exam Updated Vital Signs BP (!) 147/84 (BP Location: Left Arm)   Pulse 80   Temp 98.6 F (37 C)   Resp (!) 26   SpO2 98%  Physical Exam Vitals and nursing note reviewed.  Constitutional:      General: She is not in acute distress.    Appearance: She is not diaphoretic.  HENT:     Head: Normocephalic and atraumatic.     Mouth/Throat:     Pharynx: No oropharyngeal exudate.  Eyes:     General: No scleral icterus.    Conjunctiva/sclera: Conjunctivae normal.  Cardiovascular:     Rate and Rhythm: Normal rate and regular rhythm.     Pulses: Normal pulses.     Heart sounds: Normal heart sounds.  Pulmonary:     Effort: Pulmonary effort is normal. No respiratory distress.     Breath sounds: Normal breath sounds. No wheezing.     Comments: Diminished breath sounds noted throughout.  Wheezing noted to the upper lobes bilaterally. Abdominal:     General: Bowel sounds are normal.     Palpations: Abdomen is soft. There is no mass.     Tenderness: There is no abdominal tenderness. There is no guarding or rebound.  Musculoskeletal:        General: Normal range of motion.     Cervical back: Normal range of motion and neck supple.     Comments: Right BKA noted.  Trace to 1+ pitting edema noted to left lower extremity.  Skin:    General: Skin is warm and dry.  Neurological:     Mental Status: She is alert.  Psychiatric:        Behavior: Behavior normal.     ED Results / Procedures / Treatments   Labs (all labs ordered are listed, but only abnormal results  are displayed) Labs Reviewed  BASIC METABOLIC PANEL - Abnormal; Notable for the following components:      Result Value   Chloride 97 (*)    Glucose, Bld 125 (*)    BUN 35 (*)    Creatinine, Ser 6.00 (*)    Calcium 8.7 (*)    GFR, Estimated 7 (*)    All other components within normal limits  CBC - Abnormal; Notable for the following components:   WBC 11.5 (*)    RBC 3.01 (*)    Hemoglobin 8.8 (*)  HCT 26.2 (*)    RDW 18.3 (*)    All other components within normal limits  BRAIN NATRIURETIC PEPTIDE - Abnormal; Notable for the following components:   B Natriuretic Peptide 331.1 (*)    All other components within normal limits  PROCALCITONIN    EKG EKG Interpretation  Date/Time:  Thursday February 09 2023 13:46:48 EDT Ventricular Rate:  79 PR Interval:  170 QRS Duration: 91 QT Interval:  411 QTC Calculation: 472 R Axis:   -68 Text Interpretation: Sinus rhythm Left anterior fascicular block Borderline low voltage, extremity leads Consider anterior infarct No significant change since last tracing Confirmed by Jacalyn Lefevre [phone]) on 02/09/2023 3:40:50 PM  Radiology DG Chest Portable 1 View  Result Date: 02/09/2023 CLINICAL DATA:  Shortness of breath EXAM: PORTABLE CHEST 1 VIEW COMPARISON:  X-ray 06/22/2022 and older FINDINGS: Large-bore right IJ catheter in place with tip overlying the right atrium. Film is rotated to the left. Normal cardiopericardial silhouette. Calcified aorta. Central vascular congestion. Subtle left lung base opacity as well. Atelectasis is favored over infiltrate recommend follow-up. No pneumothorax or effusion. Overlapping cardiac leads. IMPRESSION: Stable right IJ catheter. Mild vascular congestion. Bandlike opacity left lung base. Atelectasis is favored over infiltrate. Rotated radiograph Electronically Signed   By: Karen Kays M.D.   On: 02/09/2023 14:34    Procedures Procedures    Medications Ordered in ED Medications  ipratropium-albuterol  (DUONEB) 0.5-2.5 (3) MG/3ML nebulizer solution 3 mL (3 mLs Nebulization Given 02/09/23 1340)  cefTRIAXone (ROCEPHIN) 1 g in sodium chloride 0.9 % 100 mL IVPB (0 g Intravenous Stopped 02/09/23 1710)  azithromycin (ZITHROMAX) 500 mg in sodium chloride 0.9 % 250 mL IVPB (500 mg Intravenous New Bag/Given 02/09/23 1651)  methylPREDNISolone sodium succinate (SOLU-MEDROL) 125 mg/2 mL injection 125 mg (125 mg Intravenous Given 02/09/23 1605)  ipratropium-albuterol (DUONEB) 0.5-2.5 (3) MG/3ML nebulizer solution 3 mL (3 mLs Nebulization Given 02/09/23 1615)    ED Course/ Medical Decision Making/ A&P Clinical Course as of 02/09/23 1822  Thu Feb 09, 2023  1547 Patient reevaluated and discussed with patient and husband regarding plans for admission.  Patient agreeable with this time. [SB]  1612 Consult with hospitalist Dr. Pola Corn who will admit the patient for observation. [SB]    Clinical Course User Index [SB] Shelia Kingsberry A, PA-C                             Medical Decision Making Amount and/or Complexity of Data Reviewed Labs: ordered. Radiology: ordered.  Risk Prescription drug management. Decision regarding hospitalization.   Patient presents to the ED complaining of cough for the past several days.  Patient afebrile.  Patient does not wear oxygen at baseline.  On exam patient with diminished breath sounds noted throughout with wheezing noted to the upper lobes bilaterally.  Right BKA noted.  Trace to 1+ pitting edema noted to the left lower extremity. Differential diagnosis includes CHF exacerbation, PTX, PNA.   Co morbidities that complicate the patient evaluation: Hypertension, type 2 diabetes, dialysis (MWF)  Additional history obtained:  Additional history obtained from Spouse/Significant Other  Labs:  I ordered, and personally interpreted labs.  The pertinent results include:   CBC with leukocytosis fluids BNP elevated at 313 BMP with creatinine at 6, BUN of 35, GFR 7, stable from  previous values  Imaging: I ordered imaging studies including CXR I independently visualized and interpreted imaging which showed:  Stable right IJ catheter.  Mild  vascular congestion.  Bandlike opacity left lung base. Atelectasis is favored over  infiltrate.   I agree with the radiologist interpretation  Medications:  I ordered medication including Solu-Medrol, DuoNeb, Rocephin, Zithromax for antibiotic treatment and asthma treatment. Reevaluation of the patient after these medicines and interventions, I reevaluated the patient and found that they have stayed the same I have reviewed the patients home medicines and have made adjustments as needed  Consultations: I requested consultation with the Hospitalist, Dr. Pola Corn and discussed lab and imaging findings as well as pertinent plan - they recommend: Admission for observation   Disposition: Please do suspicious for likely pneumonia and CHF exacerbation.  Doubt concerns at this time for pneumothorax. After consideration of the diagnostic results and the patients response to treatment, I feel that the patient would benefit from Admission to the hospital.  Discussed with patient and husband at bedside regarding plans for admission.  Patient has been agreeable this time.  Patient appears safe for admission at this time.   This chart was dictated using voice recognition software, Dragon. Despite the best efforts of this provider to proofread and correct errors, errors may still occur which can change documentation meaning.   Final Clinical Impression(s) / ED Diagnoses Final diagnoses:  Community acquired pneumonia, unspecified laterality  Acute on chronic congestive heart failure, unspecified heart failure type    Rx / DC Orders ED Discharge Orders     None         Daphanie Oquendo A, PA-C 02/09/23 1825    Tegeler, Canary Brim, MD 02/13/23 1512

## 2023-02-09 NOTE — ED Notes (Signed)
Kennon Rounds with [facility] called for transport

## 2023-02-09 NOTE — Progress Notes (Signed)
Call received from ED PA at [facility]. 72 year old female with history of ESRD-HD-MWF, CHF, DM2 presented with several days of productive cough Last dialysis yesterday and received about 15 minutes of treatment Per ED PA, patient was dyspneic and unable to complete sentences without coughing Does not seem to require supplemental oxygen though WBC and BNP mildly elevated Chest x-ray with possible infiltrate EDP gave IV ceftriaxone, IV Rocephin and IV Solu-Medrol Overnight observation requested  I ordered for MedSurg bed at [facility] (dialysis dependent) I suggested to add procalcitonin level as well

## 2023-02-10 ENCOUNTER — Encounter: Payer: Self-pay | Admitting: Orthopedic Surgery

## 2023-02-10 DIAGNOSIS — E1122 Type 2 diabetes mellitus with diabetic chronic kidney disease: Secondary | ICD-10-CM | POA: Diagnosis present

## 2023-02-10 DIAGNOSIS — B348 Other viral infections of unspecified site: Secondary | ICD-10-CM | POA: Diagnosis present

## 2023-02-10 DIAGNOSIS — R0603 Acute respiratory distress: Secondary | ICD-10-CM | POA: Diagnosis present

## 2023-02-10 DIAGNOSIS — E1151 Type 2 diabetes mellitus with diabetic peripheral angiopathy without gangrene: Secondary | ICD-10-CM | POA: Diagnosis present

## 2023-02-10 DIAGNOSIS — A0472 Enterocolitis due to Clostridium difficile, not specified as recurrent: Secondary | ICD-10-CM | POA: Diagnosis present

## 2023-02-10 DIAGNOSIS — J189 Pneumonia, unspecified organism: Secondary | ICD-10-CM | POA: Diagnosis present

## 2023-02-10 DIAGNOSIS — D631 Anemia in chronic kidney disease: Secondary | ICD-10-CM | POA: Diagnosis present

## 2023-02-10 DIAGNOSIS — K649 Unspecified hemorrhoids: Secondary | ICD-10-CM | POA: Diagnosis present

## 2023-02-10 DIAGNOSIS — I132 Hypertensive heart and chronic kidney disease with heart failure and with stage 5 chronic kidney disease, or end stage renal disease: Secondary | ICD-10-CM | POA: Diagnosis present

## 2023-02-10 DIAGNOSIS — M898X9 Other specified disorders of bone, unspecified site: Secondary | ICD-10-CM | POA: Diagnosis present

## 2023-02-10 DIAGNOSIS — I509 Heart failure, unspecified: Secondary | ICD-10-CM

## 2023-02-10 DIAGNOSIS — J440 Chronic obstructive pulmonary disease with acute lower respiratory infection: Secondary | ICD-10-CM | POA: Diagnosis present

## 2023-02-10 DIAGNOSIS — Z7951 Long term (current) use of inhaled steroids: Secondary | ICD-10-CM | POA: Diagnosis not present

## 2023-02-10 DIAGNOSIS — Z992 Dependence on renal dialysis: Secondary | ICD-10-CM | POA: Diagnosis not present

## 2023-02-10 DIAGNOSIS — G40909 Epilepsy, unspecified, not intractable, without status epilepticus: Secondary | ICD-10-CM | POA: Diagnosis present

## 2023-02-10 DIAGNOSIS — I2510 Atherosclerotic heart disease of native coronary artery without angina pectoris: Secondary | ICD-10-CM | POA: Diagnosis present

## 2023-02-10 DIAGNOSIS — Z881 Allergy status to other antibiotic agents status: Secondary | ICD-10-CM | POA: Diagnosis not present

## 2023-02-10 DIAGNOSIS — Z89511 Acquired absence of right leg below knee: Secondary | ICD-10-CM | POA: Diagnosis not present

## 2023-02-10 DIAGNOSIS — I5033 Acute on chronic diastolic (congestive) heart failure: Secondary | ICD-10-CM | POA: Diagnosis present

## 2023-02-10 DIAGNOSIS — Z7902 Long term (current) use of antithrombotics/antiplatelets: Secondary | ICD-10-CM | POA: Diagnosis not present

## 2023-02-10 DIAGNOSIS — E785 Hyperlipidemia, unspecified: Secondary | ICD-10-CM | POA: Diagnosis present

## 2023-02-10 DIAGNOSIS — I69391 Dysphagia following cerebral infarction: Secondary | ICD-10-CM | POA: Diagnosis not present

## 2023-02-10 DIAGNOSIS — R0600 Dyspnea, unspecified: Secondary | ICD-10-CM | POA: Diagnosis present

## 2023-02-10 DIAGNOSIS — N186 End stage renal disease: Secondary | ICD-10-CM | POA: Diagnosis present

## 2023-02-10 DIAGNOSIS — Z794 Long term (current) use of insulin: Secondary | ICD-10-CM | POA: Diagnosis not present

## 2023-02-10 DIAGNOSIS — Z7982 Long term (current) use of aspirin: Secondary | ICD-10-CM | POA: Diagnosis not present

## 2023-02-10 LAB — GLUCOSE, CAPILLARY
Glucose-Capillary: 188 mg/dL — ABNORMAL HIGH (ref 70–99)
Glucose-Capillary: 152 mg/dL — ABNORMAL HIGH (ref 70–99)
Glucose-Capillary: 202 mg/dL — ABNORMAL HIGH (ref 70–99)
Glucose-Capillary: 206 mg/dL — ABNORMAL HIGH (ref 70–99)

## 2023-02-10 LAB — RESPIRATORY PANEL BY PCR

## 2023-02-10 LAB — RENAL FUNCTION PANEL
Albumin: 3.3 g/dL — ABNORMAL LOW (ref 3.5–5.0)
Anion gap: 14 (ref 5–15)
BUN: 46 mg/dL — ABNORMAL HIGH (ref 8–23)
CO2: 24 mmol/L (ref 22–32)
Calcium: 7.9 mg/dL — ABNORMAL LOW (ref 8.9–10.3)
Chloride: 99 mmol/L (ref 98–111)
Creatinine, Ser: 6.89 mg/dL — ABNORMAL HIGH (ref 0.44–1.00)
GFR, Estimated: 6 mL/min — ABNORMAL LOW (ref >60)
Glucose, Bld: 211 mg/dL — ABNORMAL HIGH (ref 70–99)
Phosphorus: 6.3 mg/dL — ABNORMAL HIGH (ref 2.5–4.6)
Potassium: 4.2 mmol/L (ref 3.5–5.1)
Sodium: 137 mmol/L (ref 135–145)

## 2023-02-10 LAB — CBC
HCT: 27 % — ABNORMAL LOW (ref 36.0–46.0)
Hemoglobin: 9 g/dL — ABNORMAL LOW (ref 12.0–15.0)
MCH: 29.1 pg (ref 26.0–34.0)
MCHC: 33.3 g/dL (ref 30.0–36.0)
MCV: 87.4 fL (ref 80.0–100.0)
Platelets: 274 10*3/uL (ref 150–400)
RBC: 3.09 MIL/uL — ABNORMAL LOW (ref 3.87–5.11)
RDW: 18.2 % — ABNORMAL HIGH (ref 11.5–15.5)
WBC: 7.3 10*3/uL (ref 4.0–10.5)
nRBC: 0 % (ref 0.0–0.2)

## 2023-02-10 LAB — HEMOGLOBIN A1C
Hgb A1c MFr Bld: 6.7 % — ABNORMAL HIGH (ref 4.8–5.6)
Mean Plasma Glucose: 145.59 mg/dL

## 2023-02-10 LAB — MRSA NEXT GEN BY PCR, NASAL: MRSA by PCR Next Gen: NOT DETECTED

## 2023-02-10 LAB — HEPATITIS B SURFACE ANTIGEN: Hepatitis B Surface Ag: NONREACTIVE

## 2023-02-10 LAB — STREP PNEUMONIAE URINARY ANTIGEN: Strep Pneumo Urinary Antigen: NEGATIVE

## 2023-02-10 MED ORDER — CHLORHEXIDINE GLUCONATE CLOTH 2 % EX PADS
6.0000 | MEDICATED_PAD | Freq: Every day | CUTANEOUS | Status: DC
  Administered 2023-02-11 – 2023-02-14 (×4): 6 via TOPICAL

## 2023-02-10 MED ORDER — ANTICOAGULANT SODIUM CITRATE 4% (200MG/5ML) IV SOLN: 5.0000 mL | Status: DC | PRN

## 2023-02-10 MED ORDER — GUAIFENESIN-DM 100-10 MG/5ML PO SYRP
5.0000 mL | ORAL_SOLUTION | ORAL | Status: DC | PRN
  Administered 2023-02-10 – 2023-02-12 (×3): 5 mL via ORAL
  Filled 2023-02-10 (×2): qty 5
  Filled 2023-02-10: qty 10

## 2023-02-10 MED ORDER — AZITHROMYCIN 500 MG PO TABS
500.0000 mg | ORAL_TABLET | Freq: Every day | ORAL | Status: DC
  Administered 2023-02-10: 500 mg via ORAL
  Filled 2023-02-10: qty 1

## 2023-02-10 MED ORDER — LIDOCAINE-PRILOCAINE 2.5-2.5 % EX CREA: 1.0000 | TOPICAL_CREAM | CUTANEOUS | Status: DC | PRN

## 2023-02-10 MED ORDER — HEPARIN SODIUM (PORCINE) 1000 UNIT/ML DIALYSIS
2600.0000 [IU] | Freq: Once | INTRAMUSCULAR | Status: DC
  Filled 2023-02-10: qty 3

## 2023-02-10 MED ORDER — SODIUM CHLORIDE 0.9 % IV SOLN
2.0000 g | INTRAVENOUS | Status: DC
  Administered 2023-02-10: 2 g via INTRAVENOUS
  Filled 2023-02-10: qty 20

## 2023-02-10 MED ORDER — ALTEPLASE 2 MG IJ SOLR: 2.0000 mg | Freq: Once | INTRAMUSCULAR | Status: DC | PRN

## 2023-02-10 NOTE — Progress Notes (Addendum)
POST HD TX NOTE: Amendment to note: pt did get medication while on tx. She was given prn Robitussin to help w/ constant coughing that was interfering w/ tx and causing the machine to constantly alarm.  02/10/23 1739  Vitals  Temp 98.5 F (36.9 C)  Temp Source Oral  BP 136/76  MAP (mmHg) 90  BP Location Left Arm  BP Method Automatic  Patient Position (if appropriate) Lying  Pulse Rate 83  Pulse Rate Source Monitor  ECG Heart Rate 83  Resp (!) 25  Oxygen Therapy  SpO2 100 %  O2 Device Room Air  Pulse Oximetry Type Continuous  During Treatment Monitoring  Intra-Hemodialysis Comments (S)   (post HD tx VS check)  Post Treatment  Dialyzer Clearance Lightly streaked  Duration of HD Treatment -hour(s) 2.25 hour(s) (2hr 16 min)  Hemodialysis Intake (mL) 0 mL  Liters Processed 47.5  Fluid Removed (mL) 1300 mL  Tolerated HD Treatment Yes  Post-Hemodialysis Comments (S)  tx ended 1hr early d/t stating "i don't feel well", UF goal not met, blood rinsed back. VSS. Medication Admin: none  Hemodialysis Catheter Right Subclavian  No placement date or time found.   Orientation: Right  Access Location: Subclavian  Site Condition No complications  Blue Lumen Status Saline locked;Dead end cap in place (pt is "allergic"  to heparin)  Red Lumen Status Saline locked;Dead end cap in place (pt is "allergic" to heparin)  Purple Lumen Status N/A  Catheter fill solution Other (Comment) (NS)  Catheter fill volume (Arterial) 2 cc (approximate volumes per Dr. Thedore Mins VO)  Catheter fill volume (Venous) 2 (approximate volumes per Dr. Thedore Mins VO)  Dressing Type Transparent  Dressing Status Antimicrobial disc in place;Clean, Dry, Intact  Drainage Description None  Dressing Change Due 02/17/23  Post treatment catheter status Capped and Clamped

## 2023-02-10 NOTE — Progress Notes (Signed)
PROGRESS NOTE    Carolyn Russo  ZOX:[id number] DOB: [birth year] DOA: 02/09/2023 PCP: No primary care provider on file.    Brief Narrative:  Carolyn Russo is a 72 y.o. female with medical history significant of ESRD (MWF), diastolic congestive heart failure, type 2 diabetes, hypertension, CVA, peripheral artery disease who presents for 4 to 5 days of productive coughing with yellow sputum and shortness of breath that has been worsening.  At first, she thought she was having allergy symptoms.  She used her home nebulizer with some relief.  Endorses chest tightness and wheezing.  Feels like she cannot get enough air in her lungs to talk.  States that she is "a big talker".  She has to sleep sitting up at night   Assessment and Plan: Acute respiratory distress  Chest x-ray with questionable left lower lobe opacity with mild leukocytosis.  Patient started on empiric treatment for community-acquired pneumonia (azithromycin and ceftriaxone).  She does also have a mildly elevated BNP vascular congestion seen on chest x-ray. On chart review, has history of non-obstructive COPD.   - Continue IV antibiotics (CTX and Azithro)  - Check for strep pneumonia and legionella urinary antigen.  - NP swab - PRN DuoNebs    End-stage renal disease  Last dialyzed yesterday on her MWF outpatient schedule -Nephrology consulted -HD per nephrology    Acute on chronic Diastolic CHF BNP 811 on admission.  Chest x-ray without cardiomegaly or pleural effusion.  Appears euvolemic on exam.  Follows with Dr. Chilton Si.  Patient reporting worsening shortness of breath, orthopnea, and chest tightness.  Echocardiogram from 06/12/2022 showed LVEF with 6065% with grade 1 diastolic dysfunction - volume management with HD  - vitals per unit routine - strict I's and O's  - daily weights   Anemia of chronic disease Hemoglobin 8.8 on admission. -defer to nephrology   Seizure disorder -Continue home Keppra    Hypertension She is hypertensive.   - Resume home losartan, hydralazine, Coreg  -hope HD improved   Type 2 diabetes Glucose on admission 125.  Most recent A1c <5 though I suspect this may be inaccurate due to ESRD. -Sliding scale insulin   Hyperlipidemia -Continue home Crestor    CVA Patient had a CVA in 2022 of the right basal ganglia with left-sided deficits.  She has been getting outpatient physical therapy.   CAD - continue home Crestor and Brilinta   Hemorrhoids  - Continue Anusol-HC BID    DVT prophylaxis: heparin injection 5,000 Units Start: 02/09/23 2200    Code Status: Full Code Family Communication:   Disposition Plan:  Level of care: Med-Surg Status is: Inpatient Remains inpatient appropriate because: needs HD    Consultants:  renal   Subjective: On the phone speaking in full sentences  Objective: Vitals:   02/10/23 0444 02/10/23 0500 02/10/23 0755 02/10/23 0837  BP: (!) 172/79   (!) 177/67  Pulse: 84   79  Resp: 20   16  Temp: 97.8 F (36.6 C)   98.4 F (36.9 C)  TempSrc: Oral   Oral  SpO2: 98%  98% 100%  Weight:  80.3 kg      Intake/Output Summary (Last 24 hours) at 02/10/2023 1228 Last data filed at 02/10/2023 1103 Gross per 24 hour  Intake 358 ml  Output 200 ml  Net 158 ml   Filed Weights   02/10/23 0500  Weight: 80.3 kg    Examination:   General: Appearance:     Overweight female in  no acute distress     Lungs:     Rales throughout, respirations unlabored  Heart:    Normal heart rate. Normal rhythm. No murmurs, rubs, or gallops.    MS:   Amputation noted   Neurologic:   Awake, alert       Data Reviewed: I have personally reviewed following labs and imaging studies  CBC: Recent Labs  Lab 02/09/23 1356 02/10/23 0452  WBC 11.5* 7.3  HGB 8.8* 9.0*  HCT 26.2* 27.0*  MCV 87.0 87.4  PLT 252 274   Basic Metabolic Panel: Recent Labs  Lab 02/09/23 1356 02/10/23 0452  NA 138 137  K 4.3 4.2  CL 97* 99  CO2 28  24  GLUCOSE 125* 211*  BUN 35* 46*  CREATININE 6.00* 6.89*  CALCIUM 8.7* 7.9*  PHOS  --  6.3*   GFR: Estimated Creatinine Clearance: 7.5 mL/min (A) (by C-G formula based on SCr of 6.89 mg/dL (H)). Liver Function Tests: Recent Labs  Lab 02/10/23 0452  ALBUMIN 3.3*   No results for input(s): "LIPASE", "AMYLASE" in the last 168 hours. No results for input(s): "AMMONIA" in the last 168 hours. Coagulation Profile: No results for input(s): "INR", "PROTIME" in the last 168 hours. Cardiac Enzymes: No results for input(s): "CKTOTAL", "CKMB", "CKMBINDEX", "TROPONINI" in the last 168 hours. BNP (last 3 results) No results for input(s): "PROBNP" in the last 8760 hours. HbA1C: Recent Labs    02/10/23 0452  HGBA1C 6.7*   CBG: Recent Labs  Lab 02/10/23 0449 02/10/23 0839 02/10/23 1208  GLUCAP 188* 152* 202*   Lipid Profile: No results for input(s): "CHOL", "HDL", "LDLCALC", "TRIG", "CHOLHDL", "LDLDIRECT" in the last 72 hours. Thyroid Function Tests: No results for input(s): "TSH", "T4TOTAL", "FREET4", "T3FREE", "THYROIDAB" in the last 72 hours. Anemia Panel: No results for input(s): "VITAMINB12", "FOLATE", "FERRITIN", "TIBC", "IRON", "RETICCTPCT" in the last 72 hours. Sepsis Labs: Recent Labs  Lab 02/09/23 1651  PROCALCITON 0.63    No results found for this or any previous visit (from the past 240 hour(s)).       Radiology Studies: DG Chest Portable 1 View  Result Date: 02/09/2023 CLINICAL DATA:  Shortness of breath EXAM: PORTABLE CHEST 1 VIEW COMPARISON:  X-ray 06/22/2022 and older FINDINGS: Large-bore right IJ catheter in place with tip overlying the right atrium. Film is rotated to the left. Normal cardiopericardial silhouette. Calcified aorta. Central vascular congestion. Subtle left lung base opacity as well. Atelectasis is favored over infiltrate recommend follow-up. No pneumothorax or effusion. Overlapping cardiac leads. IMPRESSION: Stable right IJ catheter. Mild  vascular congestion. Bandlike opacity left lung base. Atelectasis is favored over infiltrate. Rotated radiograph Electronically Signed   By: Karen Kays M.D.   On: 02/09/2023 14:34        Scheduled Meds:  aspirin EC  81 mg Oral Daily   azithromycin  500 mg Oral q1800   carvedilol  3.125 mg Oral BID WC   Chlorhexidine Gluconate Cloth  6 each Topical Q0600   diclofenac Sodium  2 g Topical QID   heparin  2,600 Units Dialysis Once in dialysis   heparin  5,000 Units Subcutaneous Q8H   hydrALAZINE  25 mg Oral BID   hydrocortisone  25 mg Rectal BID   insulin aspart  0-6 Units Subcutaneous TID WC   levETIRAcetam  375 mg Oral Q M,W,F-1800   levETIRAcetam  750 mg Oral QHS   losartan  50 mg Oral BID   mometasone-formoterol  2 puff Inhalation BID  pantoprazole  40 mg Oral QHS   rosuvastatin  20 mg Oral Daily   sevelamer carbonate  0.8 g Oral TID WC   ticagrelor  90 mg Oral BID   Continuous Infusions:  cefTRIAXone (ROCEPHIN)  IV       LOS: 0 days    Time spent: 45 minutes spent on chart review, discussion with nursing staff, consultants, updating family and interview/physical exam; more than 50% of that time was spent in counseling and/or coordination of care.    Joseph Art, DO [facility] Available via Epic secure chat 7am-7pm After these hours, please refer to coverage provider listed on [email or web address] 02/10/2023, 12:28 PM

## 2023-02-10 NOTE — Consult Note (Signed)
Renal Service Consult Note [facility]  Carolyn Russo 02/10/2023 [name], MD Requesting Physician: Dr. Benjamine Mola  Reason for Consult: ESRD pt w/  HPI: The patient is a 72 y.o. year-old w/ PMH as below who presented to ED w/ c/o allergy symptoms, using her home nebulizer. Sitting up to sleep at night, SOB. No leg swelling, CP. In ED BP's normal to high, ^RR but on RA w/o hypoxia. WBC 11k. Hb 8.8.  BNP 331. CXR showed question lung opacities and mild central congestion. Pt was given duoneb, IV solumedrol and IV abx for possible pna. We are asked to see for dialysis.   Pt seen in room. Pt was HOH, her husband is an eye doctor in [place], son is a Clinical research associate in [place]. Dry cough and phlegm is main c/o. No SOB at rest. Last HD was Wednesday, has not missed.   ROS - denies CP, no joint pain, no HA, no blurry vision, no rash, no diarrhea, no nausea/ vomiting, no dysuria, no difficulty voiding   Past Medical History  Past Medical History:  Diagnosis Date   Acute GI bleeding    Allergy    Anemia    Anterior chest wall pain    Appendicitis 1965   Asthma    Body mass index 37.0-37.9, adult    Breast pain    Cataract    both eyes   CHF (congestive heart failure)    Chronic kidney disease    stage 5 - on dialysis   Cognitive change 04/20/2021   r/t cva 03/2821   Complication of anesthesia    Memory loss after general   Dehydration 2014   Deviated septum 1971   Diabetes mellitus    Dysphagia due to old stroke    easy to get strangled when eating   Dyspnea 2014   Extrinsic asthma    WITH ASTHMA ATTACK   Fibroid 1980   GERD (gastroesophageal reflux disease)    Heart murmur    History of migraine    History of seizure    with stroke   Hx gestational diabetes    Hyperlipidemia    Hypertension 2014   Inguinal hernia 1959   Malaise and fatigue 2014   Non-IgE mediated allergic asthma 2014   Obesity    Pelvic pain    Pregnancy, high-risk 1985   Stroke 04/20/2021    (CVA) of right basal ganglia   Tonsillitis 1968   Uterine fibroid 1980   Visual field defect    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   Past Surgical History  Past Surgical History:  Procedure Laterality Date   ABDOMINAL AORTOGRAM W/LOWER EXTREMITY N/A 02/21/2022   Procedure: ABDOMINAL AORTOGRAM W/LOWER EXTREMITY;  Surgeon: Maeola Harman, MD;  Location: [facility] INVASIVE CV LAB;  Service: Cardiovascular;  Laterality: N/A;   AMPUTATION Right 05/18/2022   Procedure: RIGHT BELOW KNEE AMPUTATION;  Surgeon: Nadara Mustard, MD;  Location: [facility] OR;  Service: Orthopedics;  Laterality: Right;   AMPUTATION TOE Right 04/15/2022   Procedure: AMPUTATION  LST TOERIGHT FOOT;  Surgeon: Edwin Cap, DPM;  Location: [facility] ORS;  Service: Podiatry;  Laterality: Right;   APPENDECTOMY  1959   BASCILIC VEIN TRANSPOSITION Right 04/30/2021   Procedure: RIGHT FIRST STAGE BASCILIC VEIN TRANSPOSITION;  Surgeon: Chuck Hint, MD;  Location: [facility] OR;  Service: Vascular;  Laterality: Right;   CESAREAN SECTION  1985   COLONOSCOPY     ESOPHAGOGASTRODUODENOSCOPY (EGD) WITH PROPOFOL N/A 04/18/2021   Procedure: ESOPHAGOGASTRODUODENOSCOPY (  EGD) WITH PROPOFOL;  Surgeon: Iva Boop, MD;  Location: [facility] ENDOSCOPY;  Service: Endoscopy;  Laterality: N/A;   EYE SURGERY     bilateral cataract    FLEXIBLE SIGMOIDOSCOPY N/A 04/18/2021   Procedure: FLEXIBLE SIGMOIDOSCOPY;  Surgeon: Iva Boop, MD;  Location: [facility] ENDOSCOPY;  Service: Endoscopy;  Laterality: N/A;   HERNIA REPAIR  1959   IR FLUORO GUIDE CV LINE RIGHT  03/18/2021   IR US GUIDE VASC ACCESS RIGHT  03/18/2021   LEFT HEART CATH AND CORONARY ANGIOGRAPHY N/A 04/04/2017   Procedure: Left Heart Cath and Coronary Angiography;  Surgeon: Lyn Records, MD;  Location: [facility] INVASIVE CV LAB;  Service: Cardiovascular;  Laterality: N/A;   MYOMECTOMY  1980, 2004, 2007   PERIPHERAL VASCULAR THROMBECTOMY  02/21/2022   Procedure: PERIPHERAL VASCULAR THROMBECTOMY;  Surgeon: Maeola Harman, MD;  Location: [facility] INVASIVE CV LAB;  Service: Cardiovascular;;  Right Tibial   RHINOPLASTY  1971   ROTATOR CUFF REPAIR  2003   SURGICAL REPAIR OF HEMORRHAGE  2015   TONSILLECTOMY  1968   Family History  Family History  Problem Relation Age of Onset   Cancer Mother        abdominal melamona   Psoriasis Mother    Alzheimer's disease Father    Cancer Cousin        colon    Diabetes Cousin    Diabetes Maternal Aunt    Colon cancer Neg Hx    Colon polyps Neg Hx    Esophageal cancer Neg Hx    Rectal cancer Neg Hx    Stomach cancer Neg Hx    Breast cancer Neg Hx    Social History  reports that she has never smoked. She has never used smokeless tobacco. She reports that she does not drink alcohol and does not use drugs. Allergies  Allergies  Allergen Reactions   Food Anaphylaxis    Peanuts - anaphylaxis Almonds - anaphylaxis Not listed on MAR   Statins Itching and Other (See Comments)    Generalized aches- tolerates crestor  Not listed on MAR   Gadolinium Derivatives Other (See Comments)    Gadolinium-Containing Contrast Media Listed as an allergy on MAR Unknown reaction   Januvia [Sitagliptin] Other (See Comments)    Listed as an allergy on MAR Unknown reaction   Pork-Derived Products Other (See Comments)    Does not eat pork  Listed on MAR   Shellfish Allergy Other (See Comments)    Mouth gets raw Listed on MAR   Tetracycline Other (See Comments)    Raw mouth Not listed on MAR   Home medications Prior to Admission medications   Medication Sig Start Date End Date Taking? Authorizing Provider  acetaminophen (TYLENOL) 500 MG tablet Take 1,000 mg by mouth 2 (two) times daily as needed (leg and arm pain).   Yes [provider]  aspirin EC 81 MG tablet Take 1 tablet (81 mg total) by mouth daily. Swallow whole. 06/10/22  Yes Willeen Niece, MD  carvedilol (COREG) 3.125 MG tablet Take 1 tablet (3.125 mg total) by mouth 2 (two) times daily with a meal.  11/01/21  Yes Dorothyann Peng, MD  diclofenac Sodium (VOLTAREN) 1 % GEL Apply 2 g topically 4 (four) times daily. To left wrist 05/26/21  Yes Love, Evlyn Kanner, PA-C  guaifenesin (ROBITUSSIN) 100 MG/5ML syrup Take 200 mg by mouth 3 (three) times daily as needed for cough.   Yes [provider]  hydrALAZINE (APRESOLINE) 25 MG  tablet Take 1 tablet (25 mg total) by mouth in the morning and at bedtime. 01/10/23  Yes Chilton Si, MD  insulin aspart (NOVOLOG) 100 UNIT/ML injection Inject 0-6 Units into the skin 3 (three) times daily with meals. CBG < 70: Implement Hypoglycemia Standing Orders and refer to Hypoglycemia Standing Orders sidebar report CBG 70 - 120: 0 units CBG 121 - 150: 0 units CBG 151 - 200: 1 unit CBG 201-250: 2 units CBG 251-300: 3 units CBG 301-350: 4 units CBG 351-400: 5 units CBG > 400: Give 6 units and call MD 06/29/22  Yes Pokhrel, Laxman, MD  insulin glargine (LANTUS) 100 UNIT/ML Solostar Pen Inject 5 Units into the skin at bedtime. 06/29/22  Yes Pokhrel, Rebekah Chesterfield, MD  levETIRAcetam (KEPPRA) 250 MG tablet Take 1.5 tablets (375 mg total) by mouth every Monday, Wednesday, and Friday at 6 PM. 06/29/22  Yes Pokhrel, Laxman, MD  levETIRAcetam (KEPPRA) 750 MG tablet Take 1 tablet (750 mg total) by mouth at bedtime. 06/29/22  Yes Pokhrel, Laxman, MD  loratadine (CLARITIN) 10 MG tablet Take 10 mg by mouth daily.   Yes [provider]  losartan (COZAAR) 50 MG tablet Take 50 mg by mouth 2 (two) times daily.   Yes [provider]  methylphenidate (RITALIN) 5 MG tablet Take 5 mg by mouth daily.   Yes [provider]  mometasone-formoterol (DULERA) 200-5 MCG/ACT AERO Inhale 2 puffs into the lungs 2 (two) times daily. 04/15/22  Yes Dorothyann Peng, MD  pantoprazole (PROTONIX) 40 MG tablet Take 1 tablet (40 mg total) by mouth daily. Patient taking differently: Take 40 mg by mouth at bedtime. 02/07/22  Yes Dorothyann Peng, MD  rosuvastatin (CRESTOR) 20 MG tablet Take 1  tablet (20 mg total) by mouth daily. Patient taking differently: Take 20 mg by mouth at bedtime. 08/18/21  Yes Ghumman, Ramandeep, NP  senna-docusate (SENOKOT-S) 8.6-50 MG tablet Take 2 tablets by mouth 2 (two) times daily. 06/29/21  Yes Ghumman, Ramandeep, NP  sevelamer carbonate (RENVELA) 0.8 g PACK packet Take 0.8 g by mouth 3 (three) times daily. 10/19/21  Yes [provider]  ticagrelor (BRILINTA) 90 MG TABS tablet Take 1 tablet (90 mg total) by mouth 2 (two) times daily. 06/10/22  Yes Willeen Niece, MD  Glucagon, rDNA, (GLUCAGON EMERGENCY) 1 MG KIT Inject 1 mg into the vein as needed (CBG of /dL).    [provider]  hydrOXYzine (VISTARIL) 25 MG capsule Take 1 capsule (25 mg total) by mouth 3 (three) times daily as needed. Patient not taking: Reported on 01/10/2023 12/09/22   Genice Rouge, MD     Vitals:   02/10/23 1429 02/10/23 1450 02/10/23 1500 02/10/23 1530  BP: (!) 122/49 (!) 139/57 (!) 138/55 (!) 112/50  Pulse: 73 71 71 72  Resp: (!) 23 19 (!) 21 (!) 32  Temp: 98.4 F (36.9 C)     TempSrc: Oral     SpO2: 95% 97% 97% 98%  Weight: 79.6 kg      Exam Gen alert, no distress No rash, cyanosis or gangrene Sclera anicteric, throat clear  No jvd or bruits Chest bilat moderate rales from bases to scapulae bilat RRR no MRG Abd soft ntnd no mass or ascites +bs GU defer MS no joint effusions or deformity Ext no LE or UE edema, no wounds or ulcers Neuro is alert, Ox 3 , nf    RIJ TDC intact    Home meds include - asa, coreg 3.125 bid, hydralazine 25 bid, insulin aspart/  glargine, keppra, losartan 50 bid, ritalin, dulera, protonix, crestor, renvela 800 ac tid, brilinta, prns    OP HD: [facility] MWF   4h 350/1.5  82.7kg  2/2 bath RIJ TDC  Heparin 2600 - last HD 4/17, post wt 82.1kg   - hectorol IV 4 mcg tiw - venofer 100mg  tiw thru 4/24 - mircera 200 mcg IV q 2 wks, last 4/17   CXR - question LLL atx vs infiltrate, mild vasc congestion  Assessment/  Plan: SOB/ cough - w/ abnormal exam (diffuse rales, minimal CXR changes, not hypoxic) and she is under dry wt, no edema on exam. May have viral ;ung infection. Getting abx for possible bact PNA.  ESRD - on HD MWF. HD today.  HTN/ volume - under dry wt, no vol ^ on exam Anemia esrd - Hb 8-10 here, just had esa on 4/17 at OP unit. Follow.  MBD ckd - CCa in range and phos a little high. Cont binder and IV vdra w/ HD.  Seizure d/o - keppra DM2 - on insulin H/o CVA CAD - on brilinta      Rob Arlean Hopping  MD [facility] 02/10/2023, 4:37 PM  Recent Labs  Lab 02/09/23 1356 02/10/23 0452  HGB 8.8* 9.0*  ALBUMIN  --  3.3*  CALCIUM 8.7* 7.9*  PHOS  --  6.3*  CREATININE 6.00* 6.89*  K 4.3 4.2    Inpatient medications:  aspirin EC  81 mg Oral Daily   azithromycin  500 mg Oral q1800   carvedilol  3.125 mg Oral BID WC   Chlorhexidine Gluconate Cloth  6 each Topical Q0600   diclofenac Sodium  2 g Topical QID   heparin  5,000 Units Subcutaneous Q8H   hydrALAZINE  25 mg Oral BID   hydrocortisone  25 mg Rectal BID   insulin aspart  0-6 Units Subcutaneous TID WC   levETIRAcetam  375 mg Oral Q M,W,F-1800   levETIRAcetam  750 mg Oral QHS   losartan  50 mg Oral BID   mometasone-formoterol  2 puff Inhalation BID   pantoprazole  40 mg Oral QHS   rosuvastatin  20 mg Oral Daily   sevelamer carbonate  0.8 g Oral TID WC   ticagrelor  90 mg Oral BID    cefTRIAXone (ROCEPHIN)  IV     acetaminophen, guaiFENesin-dextromethorphan, ipratropium-albuterol

## 2023-02-10 NOTE — Plan of Care (Signed)
  Problem: Education: Goal: Knowledge of General Education information will improve Description: Including pain rating scale, medication(s)/side effects and non-pharmacologic comfort measures Outcome: Progressing   Problem: Health Behavior/Discharge Planning: Goal: Ability to manage health-related needs will improve Outcome: Progressing   Problem: Clinical Measurements: Goal: Ability to maintain clinical measurements within normal limits will improve Outcome: Progressing Goal: Will remain free from infection Outcome: Progressing Goal: Diagnostic test results will improve Outcome: Progressing Goal: Respiratory complications will improve Outcome: Progressing Goal: Cardiovascular complication will be avoided Outcome: Progressing   Problem: Activity: Goal: Risk for activity intolerance will decrease Outcome: Progressing   Problem: Nutrition: Goal: Adequate nutrition will be maintained Outcome: Progressing   Problem: Coping: Goal: Level of anxiety will decrease Outcome: Progressing   Problem: Elimination: Goal: Will not experience complications related to bowel motility Outcome: Progressing Goal: Will not experience complications related to urinary retention Outcome: Progressing   Problem: Pain Managment: Goal: General experience of comfort will improve Outcome: Progressing   Problem: Safety: Goal: Ability to remain free from injury will improve Outcome: Progressing   Problem: Skin Integrity: Goal: Risk for impaired skin integrity will decrease Outcome: Progressing   Problem: Activity: Goal: Ability to tolerate increased activity will improve Outcome: Progressing   Problem: Clinical Measurements: Goal: Ability to maintain a body temperature in the normal range will improve Outcome: Progressing   Problem: Respiratory: Goal: Ability to maintain adequate ventilation will improve Outcome: Progressing Goal: Ability to maintain a clear airway will improve Outcome:  Progressing   Problem: Education: Goal: Ability to describe self-care measures that may prevent or decrease complications (Diabetes Survival Skills Education) will improve Outcome: Progressing Goal: Individualized Educational Video(s) Outcome: Progressing   Problem: Coping: Goal: Ability to adjust to condition or change in health will improve Outcome: Progressing   Problem: Fluid Volume: Goal: Ability to maintain a balanced intake and output will improve Outcome: Progressing   Problem: Health Behavior/Discharge Planning: Goal: Ability to identify and utilize available resources and services will improve Outcome: Progressing Goal: Ability to manage health-related needs will improve Outcome: Progressing   Problem: Metabolic: Goal: Ability to maintain appropriate glucose levels will improve Outcome: Progressing   Problem: Nutritional: Goal: Maintenance of adequate nutrition will improve Outcome: Progressing Goal: Progress toward achieving an optimal weight will improve Outcome: Progressing   Problem: Skin Integrity: Goal: Risk for impaired skin integrity will decrease Outcome: Progressing   Problem: Tissue Perfusion: Goal: Adequacy of tissue perfusion will improve Outcome: Progressing   

## 2023-02-10 NOTE — Progress Notes (Signed)
Physical Therapy Evaluation Patient Details Name: Carolyn Russo MRN: [id number] DOB: 1950-12-11 Today's Date: 02/10/2023  History of Present Illness  72 y.o. female was admitted 4/18 for cough with yellow sputum, for 4 to 5 days, SOB and noted atelectasis, chest tightness and wheezing.  Restricting conversation for pt, placed on O2 and must sleep sitting up.  CAP dx, with ABT initiated and noted acute CHF.     PMHx:   ESRD (MWF), diastolic congestive heart failure, type 2 diabetes, hypertension, CVA, PAD  Clinical Impression  Pt was seen for initial visit with donning of prosthesis and discussion of sock use on RLE.  Pt is a bit confined to add the prosthesis with a 3 ply sock, but without clicks 4 times to lock.  Pt is able to stand and pivot with direct assistance after being cleaned up from a bowel movement, and comfortable to reposition on recliner with RLE prosthesis in place.  Follow acutely for goals outlined below and progress her as tolerated, with focus on transfers in this setting and gait to continue from home.         Recommendations for follow up therapy are one component of a multi-disciplinary discharge planning process, led by the attending physician.  Recommendations may be updated based on patient status, additional functional criteria and insurance authorization.  Follow Up Recommendations       Assistance Recommended at Discharge Frequent or constant Supervision/Assistance  Patient can return home with the following  A lot of help with walking and/or transfers;A lot of help with bathing/dressing/bathroom;Assistance with cooking/housework;Direct supervision/assist for medications management;Direct supervision/assist for financial management;Assist for transportation;Help with stairs or ramp for entrance    Equipment Recommendations None recommended by PT  Recommendations for Other Services       Functional Status Assessment Patient has had a recent decline in their  functional status and demonstrates the ability to make significant improvements in function in a reasonable and predictable amount of time.     Precautions / Restrictions Precautions Precautions: Fall Precaution Comments: monitor O2 sats and HR Required Braces or Orthoses: Other Brace Other Brace: R LE prosthesis Restrictions Weight Bearing Restrictions: No Other Position/Activity Restrictions: wearing prosthesis for up to 5 hours 2x a day now      Mobility  Bed Mobility Overal bed mobility: Needs Assistance Bed Mobility: Supine to Sit     Supine to sit: Mod assist     General bed mobility comments: mod to slide bed pad as pt has both hemorrhoid and weakness generally    Transfers Overall transfer level: Needs assistance Equipment used: Rolling walker (2 wheels) Transfers: Sit to/from Stand, Bed to chair/wheelchair/BSC Sit to Stand: Min assist, +2 physical assistance, +2 safety/equipment Stand pivot transfers: Mod assist Step pivot transfers: Mod assist       General transfer comment: pt is able to stand with direct assist or with walker but two person help needed for walker    Ambulation/Gait               General Gait Details: deferred  Stairs            Wheelchair Mobility    Modified Rankin (Stroke Patients Only)       Balance Overall balance assessment: Needs assistance Sitting-balance support: Feet supported, Bilateral upper extremity supported Sitting balance-Leahy Scale: Fair     Standing balance support: Bilateral upper extremity supported, During functional activity Standing balance-Leahy Scale: Poor Standing balance comment: flexion contractures of hips affecting standing posture  Pertinent Vitals/Pain Pain Assessment Pain Assessment: Faces Faces Pain Scale: Hurts little more Pain Location: rectum/hemorrhoids Pain Descriptors / Indicators: Burning Pain Intervention(s): Monitored during  session, Repositioned    Home Living Family/patient expects to be discharged to:: Private residence Living Arrangements: Spouse/significant other Available Help at Discharge: Family;Available PRN/intermittently Type of Home: House Home Access: Ramped entrance       Home Layout: Multi-level;Able to live on main level with bedroom/bathroom Home Equipment: Rolling Walker (2 wheels);Cane - single point;Hospital bed;Other (comment);Shower seat Additional Comments: husband at home 24/7 but pt has 24/7 caregiver assist 24/7 7 days/wk    Prior Function Prior Level of Function : Needs assist       Physical Assist : Mobility (physical) Mobility (physical): Bed mobility;Transfers;Gait   Mobility Comments: pt is one person assist to stand and transfer with prosthesis ADLs Comments: caregivers provide total A with bathing and dressing. pt  is able to feed herself, groom herself, SPT to toilet with assist     Hand Dominance   Dominant Hand: Right    Extremity/Trunk Assessment   Upper Extremity Assessment Upper Extremity Assessment: Defer to OT evaluation LUE Deficits / Details: impaired strength and AROM in FF due to previous CVA    Lower Extremity Assessment Lower Extremity Assessment: Generalized weakness    Cervical / Trunk Assessment Cervical / Trunk Assessment: Kyphotic;Other exceptions (hip flexion contractures)  Communication   Communication: HOH  Cognition Arousal/Alertness: Awake/alert Behavior During Therapy: WFL for tasks assessed/performed, Flat affect Overall Cognitive Status: Within Functional Limits for tasks assessed                                          General Comments General comments (skin integrity, edema, etc.): Pt is up to stand at walker and required cleaning up from a bowel movement but did not know it had happened    Exercises     Assessment/Plan    PT Assessment Patient needs continued PT services  PT Problem List Decreased  strength;Decreased range of motion;Decreased activity tolerance;Decreased balance;Decreased mobility;Decreased coordination;Pain;Decreased skin integrity       PT Treatment Interventions DME instruction;Gait training;Functional mobility training;Therapeutic exercise;Therapeutic activities;Balance training;Neuromuscular re-education;Patient/family education    PT Goals (Current goals can be found in the Care Plan section)  Acute Rehab PT Goals Patient Stated Goal: to get back to walking PT Goal Formulation: With patient/family Time For Goal Achievement: 02/24/23 Potential to Achieve Goals: Good    Frequency Min 3X/week     Co-evaluation PT/OT/SLP Co-Evaluation/Treatment: Yes Reason for Co-Treatment: For patient/therapist safety;To address functional/ADL transfers PT goals addressed during session: Mobility/safety with mobility;Balance;Proper use of DME OT goals addressed during session: ADL's and self-care;Proper use of Adaptive equipment and DME       AM-PAC PT "6 Clicks" Mobility  Outcome Measure Help needed turning from your back to your side while in a flat bed without using bedrails?: A Lot Help needed moving from lying on your back to sitting on the side of a flat bed without using bedrails?: A Lot Help needed moving to and from a bed to a chair (including a wheelchair)?: A Lot Help needed standing up from a chair using your arms (e.g., wheelchair or bedside chair)?: A Lot Help needed to walk in hospital room?: Total Help needed climbing 3-5 steps with a railing? : Total 6 Click Score: 10    End of Session Equipment Utilized  During Treatment: Gait belt Activity Tolerance: Patient tolerated treatment well;Patient limited by fatigue;Patient limited by pain Patient left: in chair;with call bell/phone within reach;with chair alarm set;with family/visitor present Nurse Communication: Mobility status PT Visit Diagnosis: Unsteadiness on feet (R26.81);Muscle weakness  (generalized) (M62.81);Difficulty in walking, not elsewhere classified (R26.2)    Time: 1610-9604 PT Time Calculation (min) (ACUTE ONLY): 42 min   Charges:   PT Evaluation $PT Eval Moderate Complexity: 1 Mod PT Treatments $Therapeutic Activity: 8-22 mins       Ivar Drape 02/10/2023, 1:29 PM  Samul Dada, PT PhD Acute Rehab Dept. Number: [facility] [id number] and [facility] [phone]

## 2023-02-10 NOTE — Progress Notes (Signed)
PHARMACIST - PHYSICIAN COMMUNICATION DR:   Vann CONCERNING: Antibiotic IV to Oral Route Change Policy  RECOMMENDATION: This patient is receiving azithromycin by the intravenous route.  Based on criteria approved by the Pharmacy and Therapeutics Committee, the antibiotic(s) is/are being converted to the equivalent oral dose form(s).   DESCRIPTION: These criteria include:  Patient being treated for a respiratory tract infection, urinary tract infection, cellulitis or clostridium difficile associated diarrhea if on metronidazole  The patient is not neutropenic and does not exhibit a GI malabsorption state  The patient is eating (either orally or via tube) and/or has been taking other orally administered medications for a least 24 hours  The patient is improving clinically and has a Tmax < 100.5  If you have questions about this conversion, please contact the Pharmacy Department  []  ( [phone] )  [place] []  ( [phone] )  [facility] [x]  ( [phone] )  [place] []  ( [phone] )  [facility] []  ( [phone] )  [facility]   

## 2023-02-10 NOTE — Progress Notes (Signed)
Office Visit Note   Patient: Carolyn Russo           Date of Birth: 14-Jun-1951           MRN: [id number] Visit Date: 01/30/2023              Requested by: No referring provider defined for this encounter. PCP: No primary care provider on file.  Chief Complaint  Patient presents with   Right Leg - Follow-up    05/18/2022 right BKA with graft       HPI: Patient is a 72 year old woman status post right transtibial amputation.  Patient is currently wearing her prosthesis she states she is sliding down into the socket developing rubbing and pain.  Patient is working with physical therapy upstairs.  Assessment & Plan: Visit Diagnoses:  1. Below-knee amputation of right lower extremity, initial encounter     Plan: Patient was provided a prescription for [employer] to modify her socket liner and materials and supplies as needed.  Follow-Up Instructions: Return if symptoms worsen or fail to improve.   Ortho Exam  Patient is alert, oriented, no adenopathy, well-dressed, normal affect, normal respiratory effort. Examination patient has decreased residual volume and her transtibial amputation.  She is and bearing in the socket there are no open ulcers no cellulitis.  Patient is an existing right transtibial  amputee.  Patient's current comorbidities are not expected to impact the ability to function with the prescribed prosthesis. Patient verbally communicates a strong desire to use a prosthesis. Patient currently requires mobility aids to ambulate without a prosthesis.  Expects not to use mobility aids with a new prosthesis.  Patient is a K2 level ambulator that will use a prosthesis to walk around their home and the community over low level environmental barriers.      Imaging: No results found. No images are attached to the encounter.  Labs: Lab Results  Component Value Date   HGBA1C 6.7 (H) 02/10/2023   HGBA1C 4.6 (L) 06/04/2022   HGBA1C 4.7 (L) 06/03/2022   ESRSEDRATE  56 (H) 05/10/2022   CRP 9.4 (H) 05/10/2022   REPTSTATUS 06/27/2022 FINAL 06/22/2022   CULT  06/22/2022    NO GROWTH 5 DAYS Performed at [facility], [address], [place], [place] [address]      Lab Results  Component Value Date   ALBUMIN 3.3 (L) 02/10/2023   ALBUMIN 2.8 (L) 06/29/2022   ALBUMIN 2.9 (L) 06/27/2022    Lab Results  Component Value Date   MG 1.9 06/10/2022   MG 1.8 06/08/2022   MG 1.8 06/06/2022   Lab Results  Component Value Date   VD25OH 37 10/13/2011    No results found for: "PREALBUMIN"    Latest Ref Rng & Units 02/10/2023    4:52 AM 02/09/2023    1:56 PM 06/29/2022    7:20 AM  CBC EXTENDED  WBC 4.0 - 10.5 K/uL 7.3  11.5  13.4   RBC 3.87 - 5.11 MIL/uL 3.09  3.01  2.99   Hemoglobin 12.0 - 15.0 g/dL 9.0  8.8  9.7   HCT 62.1 - 46.0 % 27.0  26.2  27.9   Platelets 150 - 400 K/uL 274  252  358      There is no height or weight on file to calculate BMI.  Orders:  No orders of the defined types were placed in this encounter.  No orders of the defined types were placed in this encounter.  Procedures: No procedures performed  Clinical Data: No additional findings.  ROS:  All other systems negative, except as noted in the HPI. Review of Systems  Objective: Vital Signs: There were no vitals taken for this visit.  Specialty Comments:  No specialty comments available.  PMFS History: Patient Active Problem List   Diagnosis Date Noted   CHF (congestive heart failure) 02/10/2023   Dyspnea 02/09/2023   Acute respiratory distress 02/09/2023   Urinary tract infection without hematuria    Diabetes mellitus due to underlying condition with hypoglycemia without coma, with long-term current use of insulin    Seizure disorder 06/22/2022   Acute respiratory failure    Unresponsiveness    Pressure ulcer 06/07/2022   Acute stroke due to ischemia 06/03/2022   Chest pain 06/01/2022   History of CVA (cerebrovascular accident) 06/01/2022    Seizure    Encephalopathy    Below-knee amputation of right lower extremity    Leukocytosis    Foot infection 05/10/2022   PAD (peripheral artery disease) 05/10/2022   Gangrene of right foot 04/15/2022   Left hemiparesis 09/24/2021   Abnormality of gait 09/24/2021   Pain of right hand 08/06/2021   Steal syndrome of dialysis vascular access 08/06/2021   Subclavian steal syndrome of right subclavian artery 06/25/2021   Right hand weakness 06/25/2021   Stroke-like symptoms 05/06/2021   Bandemia    Cerebrovascular accident (CVA) of right basal ganglia 04/20/2021   Hemorrhoids    Basal ganglia infarction 04/16/2021   ESRD on dialysis 04/16/2021   Hypertensive urgency 03/10/2021   Hilar enlargement    Anemia of chronic disease    Chronic diastolic CHF (congestive heart failure) 03/09/2021   CKD (chronic kidney disease), stage V 03/08/2021   Diabetic retinopathy 03/08/2021   Hyperglycemia due to type 2 diabetes mellitus 03/08/2021   Pure hypercholesterolemia 03/08/2021   Anemia in chronic kidney disease 09/24/2020   Uncontrolled type 2 diabetes mellitus with hyperglycemia 03/19/2019   Osteopenia 09/04/2018   Migraine 09/04/2018   Asthma 09/04/2018   Pseudophakia of both eyes 07/17/2018   Pseudophakia of left eye 07/03/2018   Open angle with borderline findings and high glaucoma risk in left eye 07/03/2018   Age-related nuclear cataract of right eye 07/03/2018   CAD (coronary artery disease), native coronary artery 04/27/2017   Abnormal nuclear stress test 04/04/2017   Shortness of breath 03/09/2017   Appendicitis    Age-related hypermature cataract of both eyes 12/20/2016   Uterine leiomyoma 11/27/2012   Dyslipidemia (high LDL; low HDL) 11/25/2011   Obesity (BMI 30-39.9) 11/25/2011   Hypertensive disorder 11/21/2011   Type 2 diabetes mellitus 11/21/2011   Abnormal EKG 11/21/2011   Past Medical History:  Diagnosis Date   Acute GI bleeding    Allergy    Anemia    Anterior  chest wall pain    Appendicitis 1965   Asthma    Body mass index 37.0-37.9, adult    Breast pain    Cataract    both eyes   CHF (congestive heart failure)    Chronic kidney disease    stage 5 - on dialysis   Cognitive change 04/20/2021   r/t cva 03/2821   Complication of anesthesia    Memory loss after general   Dehydration 2014   Deviated septum 1971   Diabetes mellitus    Dysphagia due to old stroke    easy to get strangled when eating   Dyspnea 2014   Extrinsic asthma    WITH  ASTHMA ATTACK   Fibroid 1980   GERD (gastroesophageal reflux disease)    Heart murmur    History of migraine    History of seizure    with stroke   Hx gestational diabetes    Hyperlipidemia    Hypertension 2014   Inguinal hernia 1959   Malaise and fatigue 2014   Non-IgE mediated allergic asthma 2014   Obesity    Pelvic pain    Pregnancy, high-risk 1985   Stroke 04/20/2021   (CVA) of right basal ganglia   Tonsillitis 1968   Uterine fibroid 1980   Visual field defect    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    Family History  Problem Relation Age of Onset   Cancer Mother        abdominal melamona   Psoriasis Mother    Alzheimer's disease Father    Cancer Cousin        colon    Diabetes Cousin    Diabetes Maternal Aunt    Colon cancer Neg Hx    Colon polyps Neg Hx    Esophageal cancer Neg Hx    Rectal cancer Neg Hx    Stomach cancer Neg Hx    Breast cancer Neg Hx     Past Surgical History:  Procedure Laterality Date   ABDOMINAL AORTOGRAM W/LOWER EXTREMITY N/A 02/21/2022   Procedure: ABDOMINAL AORTOGRAM W/LOWER EXTREMITY;  Surgeon: Maeola Harman, MD;  Location: [facility] INVASIVE CV LAB;  Service: Cardiovascular;  Laterality: N/A;   AMPUTATION Right 05/18/2022   Procedure: RIGHT BELOW KNEE AMPUTATION;  Surgeon: Nadara Mustard, MD;  Location: [facility] OR;  Service: Orthopedics;  Laterality: Right;   AMPUTATION TOE Right 04/15/2022   Procedure: AMPUTATION  LST TOERIGHT FOOT;  Surgeon: Edwin Cap,  DPM;  Location: [facility] ORS;  Service: Podiatry;  Laterality: Right;   APPENDECTOMY  1959   BASCILIC VEIN TRANSPOSITION Right 04/30/2021   Procedure: RIGHT FIRST STAGE BASCILIC VEIN TRANSPOSITION;  Surgeon: Chuck Hint, MD;  Location: [facility] OR;  Service: Vascular;  Laterality: Right;   CESAREAN SECTION  1985   COLONOSCOPY     ESOPHAGOGASTRODUODENOSCOPY (EGD) WITH PROPOFOL N/A 04/18/2021   Procedure: ESOPHAGOGASTRODUODENOSCOPY (EGD) WITH PROPOFOL;  Surgeon: Iva Boop, MD;  Location: [facility]OSCOPY;  Service: Endoscopy;  Laterality: N/A;   EYE SURGERY     bilateral cataract    FLEXIBLE SIGMOIDOSCOPY N/A 04/18/2021   Procedure: FLEXIBLE SIGMOIDOSCOPY;  Surgeon: Iva Boop, MD;  Location: [facility] ENDOSCOPY;  Service: Endoscopy;  Laterality: N/A;   HERNIA REPAIR  1959   IR FLUORO GUIDE CV LINE RIGHT  03/18/2021   IR US GUIDE VASC ACCESS RIGHT  03/18/2021   LEFT HEART CATH AND CORONARY ANGIOGRAPHY N/A 04/04/2017   Procedure: Left Heart Cath and Coronary Angiography;  Surgeon: Lyn Records, MD;  Location: [facility] INVASIVE CV LAB;  Service: Cardiovascular;  Laterality: N/A;   MYOMECTOMY  1980, 2004, 2007   PERIPHERAL VASCULAR THROMBECTOMY  02/21/2022   Procedure: PERIPHERAL VASCULAR THROMBECTOMY;  Surgeon: Maeola Harman, MD;  Location: [facility] INVASIVE CV LAB;  Service: Cardiovascular;;  Right Tibial   RHINOPLASTY  1971   ROTATOR CUFF REPAIR  2003   SURGICAL REPAIR OF HEMORRHAGE  2015   TONSILLECTOMY  1968   Social History   Occupational History   Occupation: retired  Tobacco Use   Smoking status: Never   Smokeless tobacco: Never  Vaping Use   Vaping Use: Never used  Substance and Sexual Activity   Alcohol  use: No   Drug use: No   Sexual activity: Not Currently    Birth control/protection: Post-menopausal

## 2023-02-10 NOTE — Procedures (Signed)
   I was present at this dialysis session, have reviewed the session itself and made  appropriate changes Vinson Moselle MD [facility] pager [phone]   02/10/2023, 5:31 PM

## 2023-02-10 NOTE — Evaluation (Signed)
Occupational Therapy Evaluation Patient Details Name: Carolyn Russo MRN: [id number] DOB: 22-Nov-1950 Today's Date: 02/10/2023   History of Present Illness 72 y.o. female was admitted 4/18 for cough with yellow sputum, for 4 to 5 days, SOB and noted atelectasis, chest tightness and wheezing.  Restricting conversation for pt, placed on O2 and must sleep sitting up.  CAP dx, with ABT initiated and noted acute CHF.     PMHx:   ESRD (MWF), diastolic congestive heart failure, type 2 diabetes, hypertension, CVA, PAD   Clinical Impression   Pt presents with decline in function and safety with ADLs and ADL mobility with impaired strength, balance and endurance. PTA pt lived at home with her husband and has 24/7 caregivers 7 days/wk that provide total A with bathing, dressing and toileting. Per pt's husband she required 2 person assist with SPTs before having prosthesis, but with it she only needs 1 person assist to transfer from bed to w/c to toilet. Pt currently requires set up for self feeding, min guard A for grooming/hygiene seated EOB, min A +2 sit - stand to RW and mod A SPTs. Pt would benefit from acute OT services to address impairments to maximize level of function and safety     Recommendations for follow up therapy are one component of a multi-disciplinary discharge planning process, led by the attending physician.  Recommendations may be updated based on patient status, additional functional criteria and insurance authorization.   Assistance Recommended at Discharge Frequent or constant Supervision/Assistance  Patient can return home with the following A lot of help with bathing/dressing/bathroom;A lot of help with walking and/or transfers;Assist for transportation;Help with stairs or ramp for entrance    Functional Status Assessment  Patient has had a recent decline in their functional status and demonstrates the ability to make significant improvements in function in a reasonable and  predictable amount of time.  Equipment Recommendations  None recommended by OT    Recommendations for Other Services       Precautions / Restrictions Precautions Precautions: Fall Precaution Comments: monitor O2 sats and HR Required Braces or Orthoses: Other Brace Other Brace: R LE prosthesis Restrictions Weight Bearing Restrictions: No Other Position/Activity Restrictions: wearing prosthesis for up to 5 hours 2x a day now      Mobility Bed Mobility                    Transfers Overall transfer level: Needs assistance Equipment used: Rolling walker (2 wheels) Transfers: Sit to/from Stand, Bed to chair/wheelchair/BSC Sit to Stand: Min assist, +2 physical assistance Stand pivot transfers: Mod assist                Balance Overall balance assessment: Needs assistance Sitting-balance support: Bilateral upper extremity supported, Feet supported Sitting balance-Leahy Scale: Fair Sitting balance - Comments: slight posterior leaning   Standing balance support: Bilateral upper extremity supported, Reliant on assistive device for balance, During functional activity   Standing balance comment: flexed posture in standing, cues to increase upright stance at RW                           ADL either performed or assessed with clinical judgement   ADL Overall ADL's : Needs assistance/impaired Eating/Feeding: Set up;Sitting;Bed level   Grooming: Wash/dry hands;Wash/dry face;Min guard   Upper Body Bathing: Total assistance   Lower Body Bathing: Total assistance   Upper Body Dressing : Total assistance   Lower Body Dressing:  Total assistance   Toilet Transfer: Moderate assistance;Stand-pivot Toilet Transfer Details (indicate cue type and reason): simulated to chair Toileting- Clothing Manipulation and Hygiene: Total assistance       Functional mobility during ADLs: Moderate assistance;+2 for physical assistance;+2 for safety/equipment General ADL  Comments: at baseline, pt has 247 caregivers that provide total A with bathing, dressning and toileting. Pt stood at [facility] A for balance for posterior hygiene total A after BM     Vision Baseline Vision/History: 1 Wears glasses Ability to See in Adequate Light: 0 Adequate Patient Visual Report: No change from baseline       Perception     Praxis      Pertinent Vitals/Pain Pain Assessment Pain Assessment: Faces Faces Pain Scale: Hurts little more Pain Location: rectum/hemorrhoids Pain Descriptors / Indicators: Burning Pain Intervention(s): Monitored during session, Repositioned     Hand Dominance Right   Extremity/Trunk Assessment Upper Extremity Assessment Upper Extremity Assessment: Generalized weakness;LUE deficits/detail LUE Deficits / Details: impaired strength and AROM in FF due to previous CVA   Lower Extremity Assessment Lower Extremity Assessment: Defer to PT evaluation       Communication Communication Communication: HOH   Cognition Arousal/Alertness: Awake/alert Behavior During Therapy: WFL for tasks assessed/performed, Flat affect Overall Cognitive Status: Within Functional Limits for tasks assessed                                 General Comments: pt's husband reports that pt has good memory     General Comments       Exercises     Shoulder Instructions      Home Living Family/patient expects to be discharged to:: Private residence Living Arrangements: Spouse/significant other Available Help at Discharge: Family;Available PRN/intermittently Type of Home: House Home Access: Ramped entrance     Home Layout: Multi-level;Able to live on main level with bedroom/bathroom     Bathroom Shower/Tub: Producer, television/film/video: Standard     Home Equipment: Agricultural consultant (2 wheels);Cane - single point;Hospital bed;Other (comment);Shower seat   Additional Comments: husband at home 24/7 but pt has 24/7 caregiver assist 24/7 7  days/wk      Prior Functioning/Environment Prior Level of Function : Needs assist       Physical Assist : Mobility (physical) Mobility (physical): Bed mobility;Transfers;Gait   Mobility Comments: pt is one person assist to stand and transfer with prosthesis ADLs Comments: caregivers provide total A with bathing and dressing. pt  is able to feed herself, groom herself, SPT to toilet with assist        OT Problem List: Decreased strength;Decreased activity tolerance;Decreased range of motion;Impaired balance (sitting and/or standing);Impaired UE functional use;Pain;Decreased coordination      OT Treatment/Interventions: Self-care/ADL training;Therapeutic exercise;Balance training;DME and/or AE instruction;Therapeutic activities;Patient/family education    OT Goals(Current goals can be found in the care plan section) Acute Rehab OT Goals Patient Stated Goal: go home OT Goal Formulation: With patient/family Time For Goal Achievement: 02/24/23 Potential to Achieve Goals: Good ADL Goals Pt Will Perform Eating: with modified independence;sitting Pt Will Perform Grooming: with supervision;with set-up;sitting Pt Will Transfer to Toilet: with min assist;with min guard assist;stand pivot transfer;bedside commode  OT Frequency: Min 2X/week    Co-evaluation PT/OT/SLP Co-Evaluation/Treatment: Yes Reason for Co-Treatment: For patient/therapist safety;To address functional/ADL transfers PT goals addressed during session: Mobility/safety with mobility;Balance;Proper use of DME OT goals addressed during session: ADL's and self-care;Proper use of Adaptive equipment  and DME      AM-PAC OT "6 Clicks" Daily Activity     Outcome Measure Help from another person eating meals?: A Little Help from another person taking care of personal grooming?: A Little Help from another person toileting, which includes using toliet, bedpan, or urinal?: Total Help from another person bathing (including washing,  rinsing, drying)?: Total Help from another person to put on and taking off regular upper body clothing?: Total Help from another person to put on and taking off regular lower body clothing?: Total 6 Click Score: 10   End of Session Equipment Utilized During Treatment: Rolling walker (2 wheels);Gait belt  Activity Tolerance: Patient limited by fatigue Patient left: in chair;with call bell/phone within reach;with family/visitor present  OT Visit Diagnosis: Unsteadiness on feet (R26.81);Other abnormalities of gait and mobility (R26.89);Muscle weakness (generalized) (M62.81) Pain - part of body:  (rectum/hemorrhoids)                Time: 1610-9604 OT Time Calculation (min): 34 min Charges:  OT General Charges $OT Visit: 1 Visit OT Evaluation $OT Eval Moderate Complexity: 1 Mod   Galen Manila 02/10/2023, 1:05 PM

## 2023-02-11 DIAGNOSIS — R0603 Acute respiratory distress: Secondary | ICD-10-CM | POA: Diagnosis not present

## 2023-02-11 LAB — GLUCOSE, CAPILLARY
Glucose-Capillary: 125 mg/dL — ABNORMAL HIGH (ref 70–99)
Glucose-Capillary: 124 mg/dL — ABNORMAL HIGH (ref 70–99)
Glucose-Capillary: 240 mg/dL — ABNORMAL HIGH (ref 70–99)
Glucose-Capillary: 234 mg/dL — ABNORMAL HIGH (ref 70–99)

## 2023-02-11 LAB — HEPATITIS B SURFACE ANTIBODY, QUANTITATIVE: Hep B S AB Quant (Post): 28094 m[IU]/mL (ref >9.9)

## 2023-02-11 LAB — CLOSTRIDIUM DIFFICILE BY PCR, REFLEXED: Toxigenic C. Difficile by PCR: POSITIVE — AB

## 2023-02-11 LAB — PROCALCITONIN: Procalcitonin: 0.72 ng/mL

## 2023-02-11 LAB — C DIFFICILE QUICK SCREEN W PCR REFLEX
C Diff antigen: POSITIVE — AB
C Diff toxin: NEGATIVE

## 2023-02-11 MED ORDER — VANCOMYCIN HCL 125 MG PO CAPS
125.0000 mg | ORAL_CAPSULE | Freq: Four times a day (QID) | ORAL | Status: DC
  Administered 2023-02-11 – 2023-02-14 (×12): 125 mg via ORAL
  Filled 2023-02-11 (×16): qty 1

## 2023-02-11 MED ORDER — HYDROCORTISONE (PERIANAL) 2.5 % EX CREA
TOPICAL_CREAM | Freq: Four times a day (QID) | CUTANEOUS | Status: DC
  Filled 2023-02-11: qty 28.35

## 2023-02-11 NOTE — Progress Notes (Addendum)
[facility] Progress Note   Subjective:    Seen and examined patient at bedside. Patient's home CNA also at bedside. Currently resting with no acute issues. Tolerated yesterday's HD with net UF 1.3L. Next HD 4/22.  Objective Vitals:   02/10/23 2017 02/11/23 0423 02/11/23 0450 02/11/23 0832  BP:  (!) 157/65  (!) 161/75  Pulse:  75  72  Resp:  15  18  Temp:  (!) 97.4 F (36.3 C)  98.3 F (36.8 C)  TempSrc:  Oral    SpO2: 93% 98%  97%  Weight:   79.6 kg    Physical Exam General: Awake, alert, on RA, NAD Heart: S1 and S2; No MRGs Lungs: Clear anteriorly Abdomen: Soft and non-tender Extremities: No LE edema Dialysis Access: R [facility]   Filed Weights   02/10/23 1429 02/10/23 1739 02/11/23 0450  Weight: 79.6 kg 78.2 kg 79.6 kg    Intake/Output Summary (Last 24 hours) at 02/11/2023 1559 Last data filed at 02/10/2023 2230 Gross per 24 hour  Intake 273.99 ml  Output 1300 ml  Net -1026.01 ml    Additional Objective Labs: Basic Metabolic Panel: Recent Labs  Lab 02/09/23 1356 02/10/23 0452  NA 138 137  K 4.3 4.2  CL 97* 99  CO2 28 24  GLUCOSE 125* 211*  BUN 35* 46*  CREATININE 6.00* 6.89*  CALCIUM 8.7* 7.9*  PHOS  --  6.3*   Liver Function Tests: Recent Labs  Lab 02/10/23 0452  ALBUMIN 3.3*   No results for input(s): "LIPASE", "AMYLASE" in the last 168 hours. CBC: Recent Labs  Lab 02/09/23 1356 02/10/23 0452  WBC 11.5* 7.3  HGB 8.8* 9.0*  HCT 26.2* 27.0*  MCV 87.0 87.4  PLT 252 274   Blood Culture    Component Value Date/Time   SDES BLOOD RIGHT HAND 06/22/2022 1332   SPECREQUEST  06/22/2022 1332    AEROBIC BOTTLE ONLY Blood Culture results may not be optimal due to an inadequate volume of blood received in culture bottles   CULT  06/22/2022 1332    NO GROWTH 5 DAYS Performed at [facility], [address], [place], [place] [address]    REPTSTATUS 06/27/2022 FINAL 06/22/2022 1332    Cardiac Enzymes: No results for input(s):  "CKTOTAL", "CKMB", "CKMBINDEX", "TROPONINI" in the last 168 hours. CBG: Recent Labs  Lab 02/10/23 0839 02/10/23 1208 02/10/23 2249 02/11/23 0903 02/11/23 1207  GLUCAP 152* 202* 206* 125* 124*   Iron Studies: No results for input(s): "IRON", "TIBC", "TRANSFERRIN", "FERRITIN" in the last 72 hours. Lab Results  Component Value Date   INR 1.0 06/22/2022   INR 1.2 05/10/2022   INR 1.1 04/13/2022   Studies/Results: No results found.  Medications:   aspirin EC  81 mg Oral Daily   carvedilol  3.125 mg Oral BID WC   Chlorhexidine Gluconate Cloth  6 each Topical Q0600   diclofenac Sodium  2 g Topical QID   heparin  5,000 Units Subcutaneous Q8H   hydrALAZINE  25 mg Oral BID   hydrocortisone   Rectal QID   hydrocortisone  25 mg Rectal BID   insulin aspart  0-6 Units Subcutaneous TID WC   levETIRAcetam  375 mg Oral Q M,W,F-1800   levETIRAcetam  750 mg Oral QHS   losartan  50 mg Oral BID   mometasone-formoterol  2 puff Inhalation BID   pantoprazole  40 mg Oral QHS   rosuvastatin  20 mg Oral Daily   sevelamer carbonate  0.8 g Oral TID WC   ticagrelor  90 mg Oral BID   vancomycin  125 mg Oral QID    Dialysis Orders: [facility] MWF   4h 350/1.5  82.7kg  2/2 bath RIJ TDC  Heparin 2600 - last HD 4/17, post wt 82.1kg   - hectorol IV 4 mcg tiw - venofer  tiw thru 4/24 - mircera 200 mcg IV q 2 wks, last 4/17   CXR - question LLL atx vs infiltrate, mild vasc congestion  Home meds include - asa, coreg 3.125 bid, hydralazine 25 bid, insulin aspart/ glargine, keppra, losartan 50 bid, ritalin, dulera, protonix, crestor, renvela 800 ac tid, brilinta, prns   Assessment/Plan: SOB/ cough - w/ abnormal exam (diffuse rales, minimal CXR changes, not hypoxic) and she is under dry wt, no edema on exam. May have viral ;ung infection. Getting abx for possible bact PNA.  ESRD - on HD MWF. Next HD 4/22.  HTN/ volume - under dry wt, no vol ^ on exam, will lower at discharge. Anemia esrd - Hb 8-10  here, just had esa on 4/17 at OP unit. Follow.  MBD ckd - CCa in range and phos a little high. Cont binder and IV vdra w/ HD.  Seizure d/o - keppra DM2 - on insulin H/o CVA CAD - on brilinta  Salome Holmes, NP [facility] 02/11/2023,3:59 PM  LOS: 1 day

## 2023-02-11 NOTE — Progress Notes (Signed)
PROGRESS NOTE    Carolyn Russo  ZOX:[id number] DOB: 05/29/1951 DOA: 02/09/2023 PCP: No primary care provider on file.    Brief Narrative:  Carolyn Russo is a 72 y.o. female with medical history significant of ESRD (MWF), diastolic congestive heart failure, type 2 diabetes, hypertension, CVA, peripheral artery disease who presents for 4 to 5 days of productive coughing with yellow sputum and shortness of breath that has been worsening.  At first, she thought she was having allergy symptoms.  She used her home nebulizer with some relief.  Endorses chest tightness and wheezing.  Feels like she cannot get enough air in her lungs to talk.  States that she is "a big talker".  She has to sleep sitting up at night   Assessment and Plan: Acute respiratory distress from parainfluenza virus Chest x-ray with questionable left lower lobe opacity with mild leukocytosis.  Patient started on empiric treatment for community-acquired pneumonia (azithromycin and ceftriaxone).  She does also have a mildly elevated BNP vascular congestion seen on chest x-ray. On chart review, has history of non-obstructive COPD.   - d/c IV antibiotics (CTX and Azithro) for now-- c diff +: repeat procalcitonin - PRN DuoNebs   -NP swab-- parainfluenza virus -pulm toilet  ? C diff infection (POA) -ordered overnight, + antigen, + loose stools and +toxin -PO vanc -monitor for response -try to limit abx  End-stage renal disease  Last dialyzed yesterday on her MWF outpatient schedule -Nephrology consulted -HD per nephrology    Acute on chronic Diastolic CHF BNP 811 on admission.  Chest x-ray without cardiomegaly or pleural effusion.  Appears euvolemic on exam.  Follows with Dr. Chilton Si.  Patient reporting worsening shortness of breath, orthopnea, and chest tightness.  Echocardiogram from 06/12/2022 showed LVEF with 6065% with grade 1 diastolic dysfunction - volume management with HD  - vitals per unit routine -  strict I's and O's  - daily weights   Anemia of chronic disease Hemoglobin stable -defer to nephrology   Seizure disorder -Continue home Keppra   Hypertension She is hypertensive.   - Resume home losartan, hydralazine, Coreg    Type 2 diabetes Glucose on admission 125.  Most recent A1c <5 though I suspect this may be inaccurate due to ESRD. -Sliding scale insulin   Hyperlipidemia -Continue home Crestor    CVA Patient had a CVA in 2022 of the right basal ganglia with left-sided deficits.  She has been getting outpatient physical therapy.   CAD - continue home Crestor and Brilinta   Hemorrhoids  - Continue Anusol-HC BID    DVT prophylaxis: heparin injection 5,000 Units Start: 02/09/23 2200    Code Status: Full Code Family Communication: updated husband on phone  Disposition Plan:  Level of care: Med-Surg Status is: Inpatient Remains inpatient appropriate because: needs HD    Consultants:  renal   Subjective: Still not feeling better  Objective: Vitals:   02/10/23 2017 02/11/23 0423 02/11/23 0450 02/11/23 0832  BP:  (!) 157/65  (!) 161/75  Pulse:  75  72  Resp:  15  18  Temp:  (!) 97.4 F (36.3 C)  98.3 F (36.8 C)  TempSrc:  Oral    SpO2: 93% 98%  97%  Weight:   79.6 kg     Intake/Output Summary (Last 24 hours) at 02/11/2023 1032 Last data filed at 02/10/2023 2230 Gross per 24 hour  Intake 391.99 ml  Output 1300 ml  Net -908.01 ml   [employer]  02/10/23 1429 02/10/23 1739 02/11/23 0450  Weight: 79.6 kg 78.2 kg 79.6 kg    Examination:   General: Appearance:     Overweight female in no acute distress     Lungs:     Rales throughout, respirations unlabored  Heart:    Normal heart rate. Normal rhythm. No murmurs, rubs, or gallops.    MS:   Amputation noted   Neurologic:   Awake, alert       Data Reviewed: I have personally reviewed following labs and imaging studies  CBC: Recent Labs  Lab 02/09/23 1356 02/10/23 0452  WBC  11.5* 7.3  HGB 8.8* 9.0*  HCT 26.2* 27.0*  MCV 87.0 87.4  PLT 252 274   Basic Metabolic Panel: Recent Labs  Lab 02/09/23 1356 02/10/23 0452  NA 138 137  K 4.3 4.2  CL 97* 99  CO2 28 24  GLUCOSE 125* 211*  BUN 35* 46*  CREATININE 6.00* 6.89*  CALCIUM 8.7* 7.9*  PHOS  --  6.3*   GFR: Estimated Creatinine Clearance: 7.5 mL/min (A) (by C-G formula based on SCr of 6.89 mg/dL (H)). Liver Function Tests: Recent Labs  Lab 02/10/23 0452  ALBUMIN 3.3*   No results for input(s): "LIPASE", "AMYLASE" in the last 168 hours. No results for input(s): "AMMONIA" in the last 168 hours. Coagulation Profile: No results for input(s): "INR", "PROTIME" in the last 168 hours. Cardiac Enzymes: No results for input(s): "CKTOTAL", "CKMB", "CKMBINDEX", "TROPONINI" in the last 168 hours. BNP (last 3 results) No results for input(s): "PROBNP" in the last 8760 hours. HbA1C: Recent Labs    02/10/23 0452  HGBA1C 6.7*   CBG: Recent Labs  Lab 02/10/23 0449 02/10/23 0839 02/10/23 1208 02/10/23 2249 02/11/23 0903  GLUCAP 188* 152* 202* 206* 125*   Lipid Profile: No results for input(s): "CHOL", "HDL", "LDLCALC", "TRIG", "CHOLHDL", "LDLDIRECT" in the last 72 hours. Thyroid Function Tests: No results for input(s): "TSH", "T4TOTAL", "FREET4", "T3FREE", "THYROIDAB" in the last 72 hours. Anemia Panel: No results for input(s): "VITAMINB12", "FOLATE", "FERRITIN", "TIBC", "IRON", "RETICCTPCT" in the last 72 hours. Sepsis Labs: Recent Labs  Lab 02/09/23 1651  PROCALCITON 0.63    Recent Results (from the past 240 hour(s))  MRSA Next Gen by PCR, Nasal     Status: None   Collection Time: 02/09/23  7:11 PM   Specimen: Nasal Mucosa; Nasal Swab  Result Value Ref Range Status   MRSA by PCR Next Gen NOT DETECTED NOT DETECTED Final    Comment: (NOTE) The GeneXpert MRSA Assay (FDA approved for NASAL specimens only), is one component of a comprehensive MRSA colonization surveillance program. It is  not intended to diagnose MRSA infection nor to guide or monitor treatment for MRSA infections. Test performance is not FDA approved in patients less than 17 years old. Performed at [facility], [address], [place], [place] [address]   Respiratory (~20 pathogens) panel by PCR     Status: Abnormal   Collection Time: 02/10/23 10:10 AM   Specimen: Nasopharyngeal Swab; Respiratory  Result Value Ref Range Status   Adenovirus NOT DETECTED NOT DETECTED Final   Coronavirus 229E NOT DETECTED NOT DETECTED Final    Comment: (NOTE) The Coronavirus on the Respiratory Panel, DOES NOT test for the novel  Coronavirus (2019 nCoV)    Coronavirus HKU1 NOT DETECTED NOT DETECTED Final   Coronavirus NL63 NOT DETECTED NOT DETECTED Final   Coronavirus OC43 NOT DETECTED NOT DETECTED Final   Metapneumovirus NOT DETECTED NOT DETECTED  Final   Rhinovirus / Enterovirus NOT DETECTED NOT DETECTED Final   Influenza A NOT DETECTED NOT DETECTED Final   Influenza B NOT DETECTED NOT DETECTED Final   Parainfluenza Virus 1 NOT DETECTED NOT DETECTED Final   Parainfluenza Virus 2 NOT DETECTED NOT DETECTED Final   Parainfluenza Virus 3 DETECTED (A) NOT DETECTED Final   Parainfluenza Virus 4 NOT DETECTED NOT DETECTED Final   Respiratory Syncytial Virus NOT DETECTED NOT DETECTED Final   Bordetella pertussis NOT DETECTED NOT DETECTED Final   Bordetella Parapertussis NOT DETECTED NOT DETECTED Final   Chlamydophila pneumoniae NOT DETECTED NOT DETECTED Final   Mycoplasma pneumoniae NOT DETECTED NOT DETECTED Final    Comment: Performed at [name], [address] [place], [place] [address]  C Difficile Quick Screen w PCR reflex     Status: Abnormal   Collection Time: 02/10/23 10:51 PM   Specimen: STOOL  Result Value Ref Range Status   C Diff antigen POSITIVE (A) NEGATIVE Final   C Diff toxin NEGATIVE NEGATIVE Final   C Diff interpretation Results are indeterminate. See PCR results.  Final    Comment:  Performed at [facility], [address], [place], [place] [address]  C. Diff by PCR, Reflexed     Status: Abnormal   Collection Time: 02/10/23 10:51 PM  Result Value Ref Range Status   Toxigenic C. Difficile by PCR POSITIVE (A) NEGATIVE Final    Comment: Positive for toxigenic C. difficile with little to no toxin production. Only treat if clinical presentation suggests symptomatic illness. Performed at [facility], [address], [place], [place] [address]          Radiology Studies: DG Chest Portable 1 View  Result Date: 02/09/2023 CLINICAL DATA:  Shortness of breath EXAM: PORTABLE CHEST 1 VIEW COMPARISON:  X-ray 06/22/2022 and older FINDINGS: Large-bore right IJ catheter in place with tip overlying the right atrium. Film is rotated to the left. Normal cardiopericardial silhouette. Calcified aorta. Central vascular congestion. Subtle left lung base opacity as well. Atelectasis is favored over infiltrate recommend follow-up. No pneumothorax or effusion. Overlapping cardiac leads. IMPRESSION: Stable right IJ catheter. Mild vascular congestion. Bandlike opacity left lung base. Atelectasis is favored over infiltrate. Rotated radiograph Electronically Signed   By: Karen Kays M.D.   On: 02/09/2023 14:34        Scheduled Meds:  aspirin EC  81 mg Oral Daily   carvedilol  3.125 mg Oral BID WC   Chlorhexidine Gluconate Cloth  6 each Topical Q0600   diclofenac Sodium  2 g Topical QID   heparin  5,000 Units Subcutaneous Q8H   hydrALAZINE  25 mg Oral BID   hydrocortisone  25 mg Rectal BID   insulin aspart  0-6 Units Subcutaneous TID WC   levETIRAcetam  375 mg Oral Q M,W,F-1800   levETIRAcetam  750 mg Oral QHS   losartan  50 mg Oral BID   mometasone-formoterol  2 puff Inhalation BID   pantoprazole  40 mg Oral QHS   rosuvastatin  20 mg Oral Daily   sevelamer carbonate  0.8 g Oral TID WC   ticagrelor  90 mg Oral BID   vancomycin  125 mg Oral QID   Continuous  Infusions:     LOS: 1 day    Time spent: 45 minutes spent on chart review, discussion with nursing staff, consultants, updating family and interview/physical exam; more than 50% of that time was spent in counseling and/or  coordination of care.    Joseph Art, DO [facility] Available via Epic secure chat 7am-7pm After these hours, please refer to coverage provider listed on [email or web address] 02/11/2023, 10:32 AM

## 2023-02-12 ENCOUNTER — Inpatient Hospital Stay (HOSPITAL_COMMUNITY): Payer: Medicare PPO

## 2023-02-12 DIAGNOSIS — R0603 Acute respiratory distress: Secondary | ICD-10-CM | POA: Diagnosis not present

## 2023-02-12 LAB — GLUCOSE, CAPILLARY
Glucose-Capillary: 160 mg/dL — ABNORMAL HIGH (ref 70–99)
Glucose-Capillary: 265 mg/dL — ABNORMAL HIGH (ref 70–99)
Glucose-Capillary: 153 mg/dL — ABNORMAL HIGH (ref 70–99)
Glucose-Capillary: 221 mg/dL — ABNORMAL HIGH (ref 70–99)

## 2023-02-12 MED ORDER — LIDOCAINE HCL (PF) 1 % IJ SOLN: 5.0000 mL | INTRAMUSCULAR | Status: DC | PRN

## 2023-02-12 MED ORDER — ALTEPLASE 2 MG IJ SOLR: 2.0000 mg | Freq: Once | INTRAMUSCULAR | Status: DC | PRN

## 2023-02-12 MED ORDER — PENTAFLUOROPROP-TETRAFLUOROETH EX AERO: 1.0000 | INHALATION_SPRAY | CUTANEOUS | Status: DC | PRN

## 2023-02-12 MED ORDER — GUAIFENESIN ER 600 MG PO TB12
600.0000 mg | ORAL_TABLET | Freq: Two times a day (BID) | ORAL | Status: DC
  Administered 2023-02-12 – 2023-02-14 (×4): 600 mg via ORAL
  Filled 2023-02-12 (×5): qty 1

## 2023-02-12 MED ORDER — LIDOCAINE-PRILOCAINE 2.5-2.5 % EX CREA: 1.0000 | TOPICAL_CREAM | CUTANEOUS | Status: DC | PRN

## 2023-02-12 NOTE — Progress Notes (Signed)
PROGRESS NOTE    Carolyn Russo  ZOX:[id number] DOB: Oct 20, 1951 DOA: 02/09/2023 PCP: No primary care provider on file.    Brief Narrative:  Carolyn Russo is a 72 y.o. female with medical history significant of ESRD (MWF), diastolic congestive heart failure, type 2 diabetes, hypertension, CVA, peripheral artery disease who presents for 4 to 5 days of productive coughing with yellow sputum and shortness of breath that has been worsening.  At first, she thought she was having allergy symptoms.  She used her home nebulizer with some relief.  Endorses chest tightness and wheezing.  Feels like she cannot get enough air in her lungs to talk.  States that she is "a big talker".  She has to sleep sitting up at night.   Assessment and Plan: Acute respiratory distress from parainfluenza virus Chest x-ray with questionable left lower lobe opacity with mild leukocytosis.  Patient started on empiric treatment for community-acquired pneumonia (azithromycin and ceftriaxone).  She does also have a mildly elevated BNP vascular congestion seen on chest x-ray. On chart review, has history of non-obstructive COPD.   - d/c IV antibiotics (CTX and Azithro) for now-- c diff +: repeat procalcitonin - PRN DuoNebs  -mucinex  -NP swab-- parainfluenza virus -pulm toilet- flutter valve  ? C diff infection (POA) -ordered overnight, + antigen, + loose stools and +toxin -PO vanc -monitor for response -try to limit abx  End-stage renal disease  Last dialyzed yesterday on her MWF outpatient schedule -Nephrology consulted -HD per nephrology    Acute on chronic Diastolic CHF BNP 811 on admission.  Chest x-ray without cardiomegaly or pleural effusion.  Appears euvolemic on exam.  Follows with Dr. Chilton Si.  Patient reporting worsening shortness of breath, orthopnea, and chest tightness.  Echocardiogram from 06/12/2022 showed LVEF with 6065% with grade 1 diastolic dysfunction - volume management with HD  - vitals  per unit routine - strict I's and O's  - daily weights   Anemia of chronic disease Hemoglobin stable -defer to nephrology   Seizure disorder -Continue home Keppra   Hypertension She is hypertensive.   - Resume home losartan, hydralazine, Coreg    Type 2 diabetes Glucose on admission 125.  Most recent A1c <5 though I suspect this may be inaccurate due to ESRD. -Sliding scale insulin   Hyperlipidemia -Continue home Crestor    CVA Patient had a CVA in 2022 of the right basal ganglia with left-sided deficits.  She has been getting outpatient physical therapy.   CAD - continue home Crestor and Brilinta   Hemorrhoids  - Continue Anusol-HC BID    DVT prophylaxis: heparin injection 5,000 Units Start: 02/09/23 2200    Code Status: Full Code Family Communication: updated husband on phone 4/20  Disposition Plan:  Level of care: Med-Surg Status is: Inpatient Remains inpatient appropriate because: needs HD, weaned off O2    Consultants:  renal   Subjective: Does not want to cough as makes her "tinkle"  Objective: Vitals:   02/11/23 2043 02/12/23 0500 02/12/23 0618 02/12/23 0838  BP: (!) 153/63 (!) 158/68  (!) 151/76  Pulse: 72 69  71  Resp: Temp: 98.2 F (36.8 C) 98.1 F (36.7 C)  98.2 F (36.8 C)  TempSrc: Oral Oral  Oral  SpO2: 98% 92% 93% 95%  Weight:        Intake/Output Summary (Last 24 hours) at 02/12/2023 1106 Last data filed at 02/12/2023 0416 Gross per 24 hour  Intake 930 ml  Output --  Net 930 ml   Filed Weights   02/10/23 1429 02/10/23 1739 02/11/23 0450  Weight: 79.6 kg 78.2 kg 79.6 kg    Examination:   General: Appearance:     Overweight female in no acute distress     Lungs:     respirations unlabored  Heart:    Normal heart rate. Normal rhythm. No murmurs, rubs, or gallops.   MS:   amputation  Neurologic:   Awake, alert         Data Reviewed: I have personally reviewed following labs and imaging  studies  CBC: Recent Labs  Lab 02/09/23 1356 02/10/23 0452  WBC 11.5* 7.3  HGB 8.8* 9.0*  HCT 26.2* 27.0*  MCV 87.0 87.4  PLT 252 274   Basic Metabolic Panel: Recent Labs  Lab 02/09/23 1356 02/10/23 0452  NA 138 137  K 4.3 4.2  CL 97* 99  CO2 28 24  GLUCOSE 125* 211*  BUN 35* 46*  CREATININE 6.00* 6.89*  CALCIUM 8.7* 7.9*  PHOS  --  6.3*   GFR: Estimated Creatinine Clearance: 7.5 mL/min (A) (by C-G formula based on SCr of 6.89 mg/dL (H)). Liver Function Tests: Recent Labs  Lab 02/10/23 0452  ALBUMIN 3.3*   No results for input(s): "LIPASE", "AMYLASE" in the last 168 hours. No results for input(s): "AMMONIA" in the last 168 hours. Coagulation Profile: No results for input(s): "INR", "PROTIME" in the last 168 hours. Cardiac Enzymes: No results for input(s): "CKTOTAL", "CKMB", "CKMBINDEX", "TROPONINI" in the last 168 hours. BNP (last 3 results) No results for input(s): "PROBNP" in the last 8760 hours. HbA1C: Recent Labs    02/10/23 0452  HGBA1C 6.7*   CBG: Recent Labs  Lab 02/11/23 0903 02/11/23 1207 02/11/23 1716 02/11/23 2309 02/12/23 0835  GLUCAP 125* 124* 240* 234* 160*   Lipid Profile: No results for input(s): "CHOL", "HDL", "LDLCALC", "TRIG", "CHOLHDL", "LDLDIRECT" in the last 72 hours. Thyroid Function Tests: No results for input(s): "TSH", "T4TOTAL", "FREET4", "T3FREE", "THYROIDAB" in the last 72 hours. Anemia Panel: No results for input(s): "VITAMINB12", "FOLATE", "FERRITIN", "TIBC", "IRON", "RETICCTPCT" in the last 72 hours. Sepsis Labs: Recent Labs  Lab 02/09/23 1651 02/11/23 1005  PROCALCITON 0.63 0.72    Recent Results (from the past 240 hour(s))  MRSA Next Gen by PCR, Nasal     Status: None   Collection Time: 02/09/23  7:11 PM   Specimen: Nasal Mucosa; Nasal Swab  Result Value Ref Range Status   MRSA by PCR Next Gen NOT DETECTED NOT DETECTED Final    Comment: (NOTE) The GeneXpert MRSA Assay (FDA approved for NASAL specimens  only), is one component of a comprehensive MRSA colonization surveillance program. It is not intended to diagnose MRSA infection nor to guide or monitor treatment for MRSA infections. Test performance is not FDA approved in patients less than 24 years old. Performed at [facility], [address], [place], [place] [address]   Respiratory (~20 pathogens) panel by PCR     Status: Abnormal   Collection Time: 02/10/23 10:10 AM   Specimen: Nasopharyngeal Swab; Respiratory  Result Value Ref Range Status   Adenovirus NOT DETECTED NOT DETECTED Final   Coronavirus 229E NOT DETECTED NOT DETECTED Final    Comment: (NOTE) The Coronavirus on the Respiratory Panel, DOES NOT test for the novel  Coronavirus (2019 nCoV)    Coronavirus HKU1 NOT DETECTED NOT DETECTED Final   Coronavirus NL63 NOT DETECTED NOT DETECTED Final   Coronavirus OC43  NOT DETECTED NOT DETECTED Final   Metapneumovirus NOT DETECTED NOT DETECTED Final   Rhinovirus / Enterovirus NOT DETECTED NOT DETECTED Final   Influenza A NOT DETECTED NOT DETECTED Final   Influenza B NOT DETECTED NOT DETECTED Final   Parainfluenza Virus 1 NOT DETECTED NOT DETECTED Final   Parainfluenza Virus 2 NOT DETECTED NOT DETECTED Final   Parainfluenza Virus 3 DETECTED (A) NOT DETECTED Final   Parainfluenza Virus 4 NOT DETECTED NOT DETECTED Final   Respiratory Syncytial Virus NOT DETECTED NOT DETECTED Final   Bordetella pertussis NOT DETECTED NOT DETECTED Final   Bordetella Parapertussis NOT DETECTED NOT DETECTED Final   Chlamydophila pneumoniae NOT DETECTED NOT DETECTED Final   Mycoplasma pneumoniae NOT DETECTED NOT DETECTED Final    Comment: Performed at [facility], [address] [place], [place] [address]  C Difficile Quick Screen w PCR reflex     Status: Abnormal   Collection Time: 02/10/23 10:51 PM   Specimen: STOOL  Result Value Ref Range Status   C Diff antigen POSITIVE (A) NEGATIVE Final   C Diff toxin NEGATIVE NEGATIVE Final    C Diff interpretation Results are indeterminate. See PCR results.  Final    Comment: Performed at [facility], [address] [place], [place] [address]  C. Diff by PCR, Reflexed     Status: Abnormal   Collection Time: 02/10/23 10:51 PM  Result Value Ref Range Status   Toxigenic C. Difficile by PCR POSITIVE (A) NEGATIVE Final    Comment: Positive for toxigenic C. difficile with little to no toxin production. Only treat if clinical presentation suggests symptomatic illness. Performed at [facility], [address] [place], [place] [address]          Radiology Studies: No results found.      Scheduled Meds:  aspirin EC  81 mg Oral Daily   carvedilol  3.125 mg Oral BID WC   Chlorhexidine Gluconate Cloth  6 each Topical Q0600   diclofenac Sodium  2 g Topical QID   heparin  5,000 Units Subcutaneous Q8H   hydrALAZINE  25 mg Oral BID   hydrocortisone   Rectal QID   hydrocortisone  25 mg Rectal BID   insulin aspart  0-6 Units Subcutaneous TID WC   levETIRAcetam  375 mg Oral Q M,W,F-1800   levETIRAcetam  750 mg Oral QHS   losartan  50 mg Oral BID   mometasone-formoterol  2 puff Inhalation BID   pantoprazole  40 mg Oral QHS   rosuvastatin  20 mg Oral Daily   sevelamer carbonate  0.8 g Oral TID WC   ticagrelor  90 mg Oral BID   vancomycin  125 mg Oral QID   Continuous Infusions:     LOS: 2 days    Time spent: 45 minutes spent on chart review, discussion with nursing staff, consultants, updating family and interview/physical exam; more than 50% of that time was spent in counseling and/or coordination of care.    Joseph Art, DO [facility] Available via Epic secure chat 7am-7pm After these hours, please refer to coverage provider listed on [email or web address] 02/12/2023, 11:06 AM

## 2023-02-12 NOTE — Progress Notes (Signed)
[facility] Progress Note   Subjective:    Seen and examined patient at bedside. Reports mild SOB with congestion. Now on 3L [place]. Will repeat another CXR. Doesn't appear overloaded on exam. Next HD 4/22 1st shift.  Objective Vitals:   02/11/23 2043 02/12/23 0500 02/12/23 0618 02/12/23 0838  BP: (!) 153/63 (!) 158/68  (!) 151/76  Pulse: 72 69  71  Resp: Temp: 98.2 F (36.8 C) 98.1 F (36.7 C)  98.2 F (36.8 C)  TempSrc: Oral Oral  Oral  SpO2: 98% 92% 93% 95%  Weight:       Physical Exam General: Awake, alert, on 3L [place], NAD Heart: S1 and S2; No MRGs Lungs: Congested anteriorly with diffuse rales Abdomen: Soft and non-tender Extremities: No LE edema Dialysis Access: [facility]   Filed Weights   02/10/23 1429 02/10/23 1739 02/11/23 0450  Weight: 79.6 kg 78.2 kg 79.6 kg    Intake/Output Summary (Last 24 hours) at 02/12/2023 1011 Last data filed at 02/12/2023 0416 Gross per 24 hour  Intake 930 ml  Output --  Net 930 ml    Additional Objective Labs: Basic Metabolic Panel: Recent Labs  Lab 02/09/23 1356 02/10/23 0452  NA 138 137  K 4.3 4.2  CL 97* 99  CO2 28 24  GLUCOSE 125* 211*  BUN 35* 46*  CREATININE 6.00* 6.89*  CALCIUM 8.7* 7.9*  PHOS  --  6.3*   Liver Function Tests: Recent Labs  Lab 02/10/23 0452  ALBUMIN 3.3*   No results for input(s): "LIPASE", "AMYLASE" in the last 168 hours. CBC: Recent Labs  Lab 02/09/23 1356 02/10/23 0452  WBC 11.5* 7.3  HGB 8.8* 9.0*  HCT 26.2* 27.0*  MCV 87.0 87.4  PLT 252 274   Blood Culture    Component Value Date/Time   SDES BLOOD RIGHT HAND 06/22/2022 1332   SPECREQUEST  06/22/2022 1332    AEROBIC BOTTLE ONLY Blood Culture results may not be optimal due to an inadequate volume of blood received in culture bottles   CULT  06/22/2022 1332    NO GROWTH 5 DAYS Performed at [facility], [address] [place], [place] [address]    REPTSTATUS 06/27/2022 FINAL 06/22/2022 1332     Cardiac Enzymes: No results for input(s): "CKTOTAL", "CKMB", "CKMBINDEX", "TROPONINI" in the last 168 hours. CBG: Recent Labs  Lab 02/11/23 0903 02/11/23 1207 02/11/23 1716 02/11/23 2309 02/12/23 0835  GLUCAP 125* 124* 240* 234* 160*   Iron Studies: No results for input(s): "IRON", "TIBC", "TRANSFERRIN", "FERRITIN" in the last 72 hours. Lab Results  Component Value Date   INR 1.0 06/22/2022   INR 1.2 05/10/2022   INR 1.1 04/13/2022   Studies/Results: No results found.  Medications:   aspirin EC  81 mg Oral Daily   carvedilol  3.125 mg Oral BID WC   Chlorhexidine Gluconate Cloth  6 each Topical Q0600   diclofenac Sodium  2 g Topical QID   heparin  5,000 Units Subcutaneous Q8H   hydrALAZINE  25 mg Oral BID   hydrocortisone   Rectal QID   hydrocortisone  25 mg Rectal BID   insulin aspart  0-6 Units Subcutaneous TID WC   levETIRAcetam  375 mg Oral Q M,W,F-1800   levETIRAcetam  750 mg Oral QHS   losartan  50 mg Oral BID   mometasone-formoterol  2 puff Inhalation BID   pantoprazole  40 mg Oral QHS   rosuvastatin  20 mg Oral  Daily   sevelamer carbonate  0.8 g Oral TID WC   ticagrelor  90 mg Oral BID   vancomycin  125 mg Oral QID    Dialysis Orders: [facility] MWF   4h 350/1.5  82.7kg  2/2 bath RIJ TDC  Heparin 2600 - last HD 4/17, post wt 82.1kg   - hectorol IV 4 mcg tiw - venofer  tiw thru 4/24 - mircera 200 mcg IV q 2 wks, last 4/17   CXR - question LLL atx vs infiltrate, mild vasc congestion   Home meds include - asa, coreg 3.125 bid, hydralazine 25 bid, insulin aspart/ glargine, keppra, losartan 50 bid, ritalin, dulera, protonix, crestor, renvela 800 ac tid, brilinta, prns   Assessment/Plan: SOB/ cough - w/ abnormal exam (diffuse rales, minimal CXR changes, not hypoxic) and she is under dry wt, no edema on exam. May have viral ;ung infection. Getting abx for possible bact PNA.  ESRD - on HD MWF. Next HD 4/22 1st shift.  HTN/ volume - under dry wt, no vol ^  on exam, will lower at discharge. She reports mild SOB with congestion. Lungs congested with diffuse rales. Remains euvolemic on exam and BP acceptable. Will repeat another CXR today.  Anemia esrd - Hb 8-10 here, just had esa on 4/17 at OP unit. Follow.  MBD ckd - CCa in range and phos a little high. Cont binder and IV vdra w/ HD.  Seizure d/o - keppra DM2 - on insulin H/o CVA CAD - on brilinta  Salome Holmes, NP [facility] 02/12/2023,10:11 AM  LOS: 2 days

## 2023-02-13 ENCOUNTER — Encounter (HOSPITAL_COMMUNITY): Payer: Self-pay

## 2023-02-13 ENCOUNTER — Other Ambulatory Visit (HOSPITAL_COMMUNITY): Payer: Self-pay

## 2023-02-13 ENCOUNTER — Telehealth (HOSPITAL_COMMUNITY): Payer: Self-pay | Admitting: Pharmacy Technician

## 2023-02-13 DIAGNOSIS — R0603 Acute respiratory distress: Secondary | ICD-10-CM | POA: Diagnosis not present

## 2023-02-13 LAB — BASIC METABOLIC PANEL
Anion gap: 15 (ref 5–15)
BUN: 47 mg/dL — ABNORMAL HIGH (ref 8–23)
CO2: 22 mmol/L (ref 22–32)
Calcium: 7.7 mg/dL — ABNORMAL LOW (ref 8.9–10.3)
Chloride: 101 mmol/L (ref 98–111)
Creatinine, Ser: 7.48 mg/dL — ABNORMAL HIGH (ref 0.44–1.00)
GFR, Estimated: 5 mL/min — ABNORMAL LOW (ref >60)
Glucose, Bld: 208 mg/dL — ABNORMAL HIGH (ref 70–99)
Potassium: 3.6 mmol/L (ref 3.5–5.1)
Sodium: 138 mmol/L (ref 135–145)

## 2023-02-13 LAB — GLUCOSE, CAPILLARY
Glucose-Capillary: 113 mg/dL — ABNORMAL HIGH (ref 70–99)
Glucose-Capillary: 202 mg/dL — ABNORMAL HIGH (ref 70–99)
Glucose-Capillary: 234 mg/dL — ABNORMAL HIGH (ref 70–99)

## 2023-02-13 LAB — CBC
HCT: 27.2 % — ABNORMAL LOW (ref 36.0–46.0)
Hemoglobin: 8.9 g/dL — ABNORMAL LOW (ref 12.0–15.0)
MCH: 29.4 pg (ref 26.0–34.0)
MCHC: 32.7 g/dL (ref 30.0–36.0)
MCV: 89.8 fL (ref 80.0–100.0)
Platelets: 394 10*3/uL (ref 150–400)
RBC: 3.03 MIL/uL — ABNORMAL LOW (ref 3.87–5.11)
RDW: 18.8 % — ABNORMAL HIGH (ref 11.5–15.5)
WBC: 11.7 10*3/uL — ABNORMAL HIGH (ref 4.0–10.5)
nRBC: 1 % — ABNORMAL HIGH (ref 0.0–0.2)

## 2023-02-13 MED ORDER — DOXERCALCIFEROL 4 MCG/2ML IV SOLN: 4.0000 ug | INTRAVENOUS | Status: DC

## 2023-02-13 MED ORDER — HEPARIN SODIUM (PORCINE) 1000 UNIT/ML IJ SOLN
INTRAMUSCULAR | Status: AC
  Filled 2023-02-13: qty 7

## 2023-02-13 MED ORDER — INSULIN GLARGINE-YFGN 100 UNIT/ML ~~LOC~~ SOLN
5.0000 [IU] | Freq: Every day | SUBCUTANEOUS | Status: DC
  Administered 2023-02-14: 5 [IU] via SUBCUTANEOUS
  Filled 2023-02-13 (×2): qty 0.05

## 2023-02-13 MED ORDER — ONDANSETRON 4 MG PO TBDP
8.0000 mg | ORAL_TABLET | Freq: Three times a day (TID) | ORAL | Status: DC | PRN
  Administered 2023-02-13: 8 mg via ORAL
  Filled 2023-02-13: qty 2

## 2023-02-13 NOTE — Procedures (Signed)
Patient was seen on dialysis and the procedure was supervised.  BFR 350  Via TDC BP is  179/74.   Patient appears to be tolerating treatment well but has many other complaints-  including nausea -  will give zofran prn   Carolyn Russo 02/13/2023

## 2023-02-13 NOTE — Progress Notes (Signed)
Physical Therapy Treatment Patient Details Name: JAELAH HAUTH MRN: [id number] DOB: 1950-12-20 Today's Date: 02/13/2023   History of Present Illness 72 y.o. female was admitted 4/18 for cough with yellow sputum, for 4 to 5 days, SOB and noted atelectasis, chest tightness and wheezing.  Restricting conversation for pt, placed on O2 and must sleep sitting up.  CAP dx, with ABT initiated and noted acute CHF.     PMHx:   ESRD (MWF), diastolic congestive heart failure, type 2 diabetes, hypertension, CVA, PAD, right BKA    PT Comments    Pt admitted with above diagnosis. Pt declined gettting to EOB however PT noted that pt was wet and then discovered BM in diaper.  Removed diaper and cleaned pt, changing all linens as well. Pt assist with rolling but assist to scoot up in bed.  Will return at later date to progress further.  Pt currently with functional limitations due to balance and endurance deficits. Pt will benefit from acute skilled PT to increase their independence and safety with mobility to allow discharge.      Recommendations for follow up therapy are one component of a multi-disciplinary discharge planning process, led by the attending physician.  Recommendations may be updated based on patient status, additional functional criteria and insurance authorization.  Follow Up Recommendations       Assistance Recommended at Discharge Frequent or constant Supervision/Assistance  Patient can return home with the following A lot of help with walking and/or transfers;A lot of help with bathing/dressing/bathroom;Assistance with cooking/housework;Direct supervision/assist for medications management;Direct supervision/assist for financial management;Assist for transportation;Help with stairs or ramp for entrance   Equipment Recommendations  None recommended by PT    Recommendations for Other Services       Precautions / Restrictions Precautions Precautions: Fall Precaution Comments: monitor O2  sats and HR Required Braces or Orthoses: Other Brace Other Brace: R LE prosthesis Restrictions Weight Bearing Restrictions: No Other Position/Activity Restrictions: wearing prosthesis for up to 5 hours 2x a day now     Mobility  Bed Mobility Overal bed mobility: Needs Assistance   Rolling: Mod assist, +2 for safety/equipment         General bed mobility comments: Noted that pt was wet and had BM in diaper.  Rolled pt bilaterally several times to change pt, clean pt and had to change all bed linens. Pt requires 2 people. Pt reports she is too tired to sit EOB therefore moved pt to [facility] with use of pad to move her.  Placed bed in 50 degree chair position and pt was happy to be sitting up more.    Transfers                   General transfer comment: deferred due to fatigue    Ambulation/Gait               General Gait Details: deferred   Stairs             Wheelchair Mobility    Modified Rankin (Stroke Patients Only)       Balance                                            Cognition Arousal/Alertness: Awake/alert Behavior During Therapy: WFL for tasks assessed/performed, Flat affect Overall Cognitive Status: Within Functional Limits for tasks assessed  Exercises      General Comments        Pertinent Vitals/Pain Pain Assessment Pain Assessment: Faces Faces Pain Scale: Hurts little more Pain Location: rectum/hemorrhoids Pain Descriptors / Indicators: Burning Pain Intervention(s): Limited activity within patient's tolerance, Monitored during session, Repositioned    Home Living                          Prior Function            PT Goals (current goals can now be found in the care plan section) Acute Rehab PT Goals Patient Stated Goal: to get back to walking Progress towards PT goals: Not progressing toward goals - comment (HD made pt too  tired)    Frequency    Min 3X/week      PT Plan Current plan remains appropriate    Co-evaluation              AM-PAC PT "6 Clicks" Mobility   Outcome Measure  Help needed turning from your back to your side while in a flat bed without using bedrails?: A Lot Help needed moving from lying on your back to sitting on the side of a flat bed without using bedrails?: A Lot Help needed moving to and from a bed to a chair (including a wheelchair)?: A Lot Help needed standing up from a chair using your arms (e.g., wheelchair or bedside chair)?: A Lot Help needed to walk in hospital room?: Total Help needed climbing 3-5 steps with a railing? : Total 6 Click Score: 10    End of Session Equipment Utilized During Treatment: Gait belt Activity Tolerance: Patient limited by fatigue Patient left: with call bell/phone within reach;with family/visitor present;in bed;with bed alarm set Nurse Communication: Mobility status;Need for lift equipment PT Visit Diagnosis: Unsteadiness on feet (R26.81);Muscle weakness (generalized) (M62.81);Difficulty in walking, not elsewhere classified (R26.2)     Time: 2952-8413 PT Time Calculation (min) (ACUTE ONLY): 22 min  Charges:  $Therapeutic Activity: 8-22 mins                     [facility] M,PT Acute Rehab Services [phone]    Bevelyn Buckles 02/13/2023, 3:42 PM

## 2023-02-13 NOTE — TOC Benefit Eligibility Note (Signed)
Patient Product/process development scientist completed.    The patient is currently admitted and upon discharge could be taking Vancomycin 125 mg capsules.  Requires Prior Authorization  The patient is insured through [facility] Part D   This test claim was processed through [facility]- copay amounts may vary at other pharmacies due to pharmacy/plan contracts, or as the patient moves through the different stages of their insurance plan.  Roland Earl, CPHT Pharmacy Patient Advocate Specialist [facility] Patient Advocate Team Direct Number: [phone]  Fax: [phone]

## 2023-02-13 NOTE — Progress Notes (Signed)
OT Cancellation Note  Patient Details Name: Carolyn Russo MRN: [id number] DOB: 11/06/1950   Cancelled Treatment:    Reason Eval/Treat Not Completed: Patient at procedure or test/ unavailable. Pt in HD, will follow up next available time  Galen Manila 02/13/2023, 9:09 AM

## 2023-02-13 NOTE — Progress Notes (Signed)
PT Cancellation Note  Patient Details Name: Carolyn Russo MRN: [id number] DOB: 02-18-1951   Cancelled Treatment:    Reason Eval/Treat Not Completed: Patient at procedure or test/unavailable (Pt in HD. Will return as able.)   Bevelyn Buckles 02/13/2023, 8:48 AM Rayden Scheper M,PT Acute Rehab Services [phone]

## 2023-02-13 NOTE — Progress Notes (Signed)
[facility] Progress Note   Subjective:    Seen and examined patient on HD-  c/o diarrhea, not sleeping-  sob-  not being able to get breakfast prior to coming to HD-  is also curently nauseated-  says it is common on HD   Objective Vitals:   02/13/23 0849 02/13/23 0900 02/13/23 0930 02/13/23 1000  BP: (!) 189/74 (!) 173/74 (!) 171/72 (!) 178/75  Pulse: 72 74 75 69  Resp: (!) 28 (!) Temp: 97.6 F (36.4 C)     TempSrc:      SpO2: 100% 99% 100% 100%  Weight: 77.2 kg      Physical Exam General: Awake, alert, on 2L [place], has emesis bag near her Heart: S1 and S2; No MRGs Lungs: Congested anteriorly with diffuse rales Abdomen: Soft and non-tender Extremities: No LE edema Dialysis Access: R [facility]   Filed Weights   02/11/23 0450 02/13/23 0500 02/13/23 0849  Weight: 79.6 kg 81.2 kg 77.2 kg   No intake or output data in the 24 hours ending 02/13/23 1034   Additional Objective Labs: Basic Metabolic Panel: Recent Labs  Lab 02/09/23 1356 02/10/23 0452 02/13/23 0334  NA 138 137 138  K 4.3 4.2 3.6  CL 97* 99 101  CO2 GLUCOSE 125* 211* 208*  BUN 35* 46* 47*  CREATININE 6.00* 6.89* 7.48*  CALCIUM 8.7* 7.9* 7.7*  PHOS  --  6.3*  --    Liver Function Tests: Recent Labs  Lab 02/10/23 0452  ALBUMIN 3.3*   No results for input(s): "LIPASE", "AMYLASE" in the last 168 hours. CBC: Recent Labs  Lab 02/09/23 1356 02/10/23 0452 02/13/23 0334  WBC 11.5* 7.3 11.7*  HGB 8.8* 9.0* 8.9*  HCT 26.2* 27.0* 27.2*  MCV 87.0 87.4 89.8  PLT 252 274 394   Blood Culture    Component Value Date/Time   SDES BLOOD RIGHT HAND 06/22/2022 1332   SPECREQUEST  06/22/2022 1332    AEROBIC BOTTLE ONLY Blood Culture results may not be optimal due to an inadequate volume of blood received in culture bottles   CULT  06/22/2022 1332    NO GROWTH 5 DAYS Performed at [facility], [address], [place], [place] [address]    REPTSTATUS 06/27/2022 FINAL  06/22/2022 1332    Cardiac Enzymes: No results for input(s): "CKTOTAL", "CKMB", "CKMBINDEX", "TROPONINI" in the last 168 hours. CBG: Recent Labs  Lab 02/11/23 2309 02/12/23 0835 02/12/23 1145 02/12/23 1631 02/12/23 2028  GLUCAP 234* 160* 265* 153* 221*   Iron Studies: No results for input(s): "IRON", "TIBC", "TRANSFERRIN", "FERRITIN" in the last 72 hours. Lab Results  Component Value Date   INR 1.0 06/22/2022   INR 1.2 05/10/2022   INR 1.1 04/13/2022   Studies/Results: DG CHEST PORT 1 VIEW  Result Date: 02/12/2023 CLINICAL DATA:  Shortness of breath EXAM: PORTABLE CHEST 1 VIEW COMPARISON:  02/09/2023 FINDINGS: Right IJ approach hemodialysis catheter remains positioned at the level of the right atrium. Heart size within normal limits. Aortic atherosclerosis. Mild vascular congestion. No focal airspace consolidation, pleural effusion, or pneumothorax. IMPRESSION: Mild vascular congestion. No focal airspace consolidation. Electronically Signed   By: Duanne Guess D.O.   On: 02/12/2023 13:14    Medications:   aspirin EC  81 mg Oral Daily   carvedilol  3.125 mg Oral BID WC   Chlorhexidine Gluconate Cloth  6 each Topical Q0600   diclofenac Sodium  2 g Topical QID  guaiFENesin  600 mg Oral BID   heparin  5,000 Units Subcutaneous Q8H   heparin sodium (porcine)       hydrALAZINE  25 mg Oral BID   hydrocortisone   Rectal QID   hydrocortisone  25 mg Rectal BID   insulin aspart  0-6 Units Subcutaneous TID WC   insulin glargine-yfgn  5 Units Subcutaneous Daily   levETIRAcetam  375 mg Oral Q M,W,F-1800   levETIRAcetam  750 mg Oral QHS   losartan  50 mg Oral BID   mometasone-formoterol  2 puff Inhalation BID   pantoprazole  40 mg Oral QHS   rosuvastatin  20 mg Oral Daily   sevelamer carbonate  0.8 g Oral TID WC   ticagrelor  90 mg Oral BID   vancomycin  125 mg Oral QID    Dialysis Orders: [facility] MWF   4h 350/1.5  82.7kg  2/2 bath RIJ TDC  Heparin 2600 - last HD 4/17, post wt  82.1kg   - hectorol IV 4 mcg tiw - venofer  tiw thru 4/24 - mircera 200 mcg IV q 2 wks, last 4/17   CXR - question LLL atx vs infiltrate, mild vasc congestion   Home meds include - asa, coreg 3.125 bid, hydralazine 25 bid,  keppra, losartan 50 bid, ritalin, dulera, protonix, crestor, renvela 800 ac tid, brilinta  Assessment/Plan: SOB/ cough - w/ abnormal exam (diffuse rales, minimal CXR changes, not hypoxic) and she is under dry wt, no edema on exam. May have viral ;ung infection. Getting abx for possible bact PNA-  getting to appropriate EDW-  UF as able.  ESRD - on HD MWF. Next HD 4/24 HTN/ volume - under dry wt, no vol ^ on exam and BP is high , will lower at discharge.  Anemia esrd - Hb 8-10 here, just had esa on 4/17 at OP unit. Follow.  MBD ckd - CCa in range and phos Carolyn little high. Cont renvela powder and IV vdra w/ HD.  Seizure d/o - keppra DM2 - on insulin H/o CVA CAD - on brilinta  Carolyn Russo Carolyn Russo  [facility] 02/13/2023,10:34 AM  LOS: 3 days

## 2023-02-13 NOTE — Telephone Encounter (Signed)
Patient Advocate Encounter  Prior Authorization for Vancomycin HCl  capsules has been approved.    PA# [id number] Key: [id number] Insurance [employer] Electronic PA Form Effective dates: 02/13/2023 through 10/24/2023  Patients co-pay is $10.00.     Roland Earl, CPhT Pharmacy Patient Advocate Specialist [facility] Patient Advocate Team Direct Number: [phone]  Fax: [phone]

## 2023-02-13 NOTE — Progress Notes (Signed)
PROGRESS NOTE    Carolyn Russo  GNF:[id number] DOB: 09-May-1951 DOA: 02/09/2023 PCP: No primary care provider on file.    Brief Narrative:  Carolyn Russo is a 72 y.o. female with medical history significant of ESRD (MWF), diastolic congestive heart failure, type 2 diabetes, hypertension, CVA, peripheral artery disease who presents for 4 to 5 days of productive coughing with yellow sputum and shortness of breath that has been worsening.  At first, she thought she was having allergy symptoms.  She used her home nebulizer with some relief.  Endorses chest tightness and wheezing.  Feels like she cannot get enough air in her lungs to talk.  States that she is "a big talker". Found to have parainfluenza virus and possibly C diff.  Working to wean her O2.   Assessment and Plan: Acute respiratory distress from parainfluenza virus Chest x-ray with questionable left lower lobe opacity with mild leukocytosis.  Patient started on empiric treatment for community-acquired pneumonia (azithromycin and ceftriaxone).  She does also have a mildly elevated BNP vascular congestion seen on chest x-ray. On chart review, has history of non-obstructive COPD.   - d/c IV antibiotics (CTX and Azithro) for now-- c diff + and also with parainfluenza virus - PRN DuoNebs  -mucinex  -NP swab-- parainfluenza virus -pulm toilet- flutter valve -wean O2 to off  ? C diff infection (POA) -testing ordered overnight, + antigen, + loose stools and +toxin -PO vanc -monitor for response -try to limit abx  End-stage renal disease  -MWF outpatient schedule -Nephrology consulted -HD per nephrology 4/22   Acute on chronic Diastolic CHF BNP 846 on admission.  Chest x-ray without cardiomegaly or pleural effusion.  Appears euvolemic on exam.  Follows with Dr. Chilton Si.  Patient reporting worsening shortness of breath, orthopnea, and chest tightness.  Echocardiogram from 06/12/2022 showed LVEF with 6065% with grade 1 diastolic  dysfunction - volume management with HD  - vitals per unit routine - strict I's and O's  - daily weights   Anemia of chronic disease Hemoglobin stable -defer to nephrology   Seizure disorder -Continue home Keppra   Hypertension She is hypertensive.   - Resume home losartan, hydralazine, Coreg    Type 2 diabetes Glucose on admission 125.  Most recent A1c <5 though I suspect this may be inaccurate due to ESRD. -Sliding scale insulin -resume home long acting   Hyperlipidemia -Continue home Crestor    CVA Patient had a CVA in 2022 of the right basal ganglia with left-sided deficits.  She has been getting outpatient physical therapy.   CAD - continue home Crestor and Brilinta   Hemorrhoids  - Continue Anusol-HC BID    DVT prophylaxis: heparin injection 5,000 Units Start: 02/09/23 2200    Code Status: Full Code Family Communication: updated husband on phone 4/20  Disposition Plan:  Level of care: Med-Surg Status is: Inpatient Remains inpatient appropriate because: weaned off O2    Consultants:  renal   Subjective: Did not sleep well as she said she had diarrhea all night Upset she was taken to HD without breakfast  Objective: Vitals:   02/12/23 2019 02/12/23 2028 02/13/23 0500 02/13/23 0523  BP:  (!) 182/69  (!) 176/71  Pulse: 70 70  73  Resp: 16 18  18   Temp:  (!) 97.5 F (36.4 C)  98.2 F (36.8 C)  TempSrc:  Oral  Oral  SpO2: 97% 100%  100%  Weight:   81.2 kg    No intake or  output data in the 24 hours ending 02/13/23 0831  Filed Weights   02/10/23 1739 02/11/23 0450 02/13/23 0500  Weight: 78.2 kg 79.6 kg 81.2 kg    Examination:   General: Appearance:     Overweight female in no acute distress in HD     Lungs:     respirations unlabored, wet sounding cough, no wheezing  Heart:    Normal heart rate.  MS:   Amputation noted  Neurologic:   Awake, alert         Data Reviewed: I have personally reviewed following labs and imaging  studies  CBC: Recent Labs  Lab 02/09/23 1356 02/10/23 0452 02/13/23 0334  WBC 11.5* 7.3 11.7*  HGB 8.8* 9.0* 8.9*  HCT 26.2* 27.0* 27.2*  MCV 87.0 87.4 89.8  PLT 252 274 394   Basic Metabolic Panel: Recent Labs  Lab 02/09/23 1356 02/10/23 0452 02/13/23 0334  NA 138 137 138  K 4.3 4.2 3.6  CL 97* 99 101  CO2 GLUCOSE 125* 211* 208*  BUN 35* 46* 47*  CREATININE 6.00* 6.89* 7.48*  CALCIUM 8.7* 7.9* 7.7*  PHOS  --  6.3*  --    GFR: Estimated Creatinine Clearance: 6.9 mL/min (A) (by C-G formula based on SCr of 7.48 mg/dL (H)). Liver Function Tests: Recent Labs  Lab 02/10/23 0452  ALBUMIN 3.3*   No results for input(s): "LIPASE", "AMYLASE" in the last 168 hours. No results for input(s): "AMMONIA" in the last 168 hours. Coagulation Profile: No results for input(s): "INR", "PROTIME" in the last 168 hours. Cardiac Enzymes: No results for input(s): "CKTOTAL", "CKMB", "CKMBINDEX", "TROPONINI" in the last 168 hours. BNP (last 3 results) No results for input(s): "PROBNP" in the last 8760 hours. HbA1C: No results for input(s): "HGBA1C" in the last 72 hours.  CBG: Recent Labs  Lab 02/11/23 2309 02/12/23 0835 02/12/23 1145 02/12/23 1631 02/12/23 2028  GLUCAP 234* 160* 265* 153* 221*   Lipid Profile: No results for input(s): "CHOL", "HDL", "LDLCALC", "TRIG", "CHOLHDL", "LDLDIRECT" in the last 72 hours. Thyroid Function Tests: No results for input(s): "TSH", "T4TOTAL", "FREET4", "T3FREE", "THYROIDAB" in the last 72 hours. Anemia Panel: No results for input(s): "VITAMINB12", "FOLATE", "FERRITIN", "TIBC", "IRON", "RETICCTPCT" in the last 72 hours. Sepsis Labs: Recent Labs  Lab 02/09/23 1651 02/11/23 1005  PROCALCITON 0.63 0.72    Recent Results (from the past 240 hour(s))  MRSA Next Gen by PCR, Nasal     Status: None   Collection Time: 02/09/23  7:11 PM   Specimen: Nasal Mucosa; Nasal Swab  Result Value Ref Range Status   MRSA by PCR Next Gen NOT  DETECTED NOT DETECTED Final    Comment: (NOTE) The GeneXpert MRSA Assay (FDA approved for NASAL specimens only), is one component of a comprehensive MRSA colonization surveillance program. It is not intended to diagnose MRSA infection nor to guide or monitor treatment for MRSA infections. Test performance is not FDA approved in patients less than 62 years old. Performed at [facility], [address] [place], [place] [address]   Respiratory (~20 pathogens) panel by PCR     Status: Abnormal   Collection Time: 02/10/23 10:10 AM   Specimen: Nasopharyngeal Swab; Respiratory  Result Value Ref Range Status   Adenovirus NOT DETECTED NOT DETECTED Final   Coronavirus 229E NOT DETECTED NOT DETECTED Final    Comment: (NOTE) The Coronavirus on the Respiratory Panel, DOES NOT test for the novel  Coronavirus (2019 nCoV)  Coronavirus HKU1 NOT DETECTED NOT DETECTED Final   Coronavirus NL63 NOT DETECTED NOT DETECTED Final   Coronavirus OC43 NOT DETECTED NOT DETECTED Final   Metapneumovirus NOT DETECTED NOT DETECTED Final   Rhinovirus / Enterovirus NOT DETECTED NOT DETECTED Final   Influenza A NOT DETECTED NOT DETECTED Final   Influenza B NOT DETECTED NOT DETECTED Final   Parainfluenza Virus 1 NOT DETECTED NOT DETECTED Final   Parainfluenza Virus 2 NOT DETECTED NOT DETECTED Final   Parainfluenza Virus 3 DETECTED (A) NOT DETECTED Final   Parainfluenza Virus 4 NOT DETECTED NOT DETECTED Final   Respiratory Syncytial Virus NOT DETECTED NOT DETECTED Final   Bordetella pertussis NOT DETECTED NOT DETECTED Final   Bordetella Parapertussis NOT DETECTED NOT DETECTED Final   Chlamydophila pneumoniae NOT DETECTED NOT DETECTED Final   Mycoplasma pneumoniae NOT DETECTED NOT DETECTED Final    Comment: Performed at [facility], [address] [place], [place] [address]  C Difficile Quick Screen w PCR reflex     Status: Abnormal   Collection Time: 02/10/23 10:51 PM   Specimen: STOOL  Result  Value Ref Range Status   C Diff antigen POSITIVE (A) NEGATIVE Final   C Diff toxin NEGATIVE NEGATIVE Final   C Diff interpretation Results are indeterminate. See PCR results.  Final    Comment: Performed at [facility], [address] [place], [place] [address]  C. Diff by PCR, Reflexed     Status: Abnormal   Collection Time: 02/10/23 10:51 PM  Result Value Ref Range Status   Toxigenic C. Difficile by PCR POSITIVE (A) NEGATIVE Final    Comment: Positive for toxigenic C. difficile with little to no toxin production. Only treat if clinical presentation suggests symptomatic illness. Performed at [facility], [address] [place], [place] [address]          Radiology Studies: DG CHEST PORT 1 VIEW  Result Date: 02/12/2023 CLINICAL DATA:  Shortness of breath EXAM: PORTABLE CHEST 1 VIEW COMPARISON:  02/09/2023 FINDINGS: Right IJ approach hemodialysis catheter remains positioned at the level of the right atrium. Heart size within normal limits. Aortic atherosclerosis. Mild vascular congestion. No focal airspace consolidation, pleural effusion, or pneumothorax. IMPRESSION: Mild vascular congestion. No focal airspace consolidation. Electronically Signed   By: Duanne Guess D.O.   On: 02/12/2023 13:14        Scheduled Meds:  aspirin EC  81 mg Oral Daily   carvedilol  3.125 mg Oral BID WC   Chlorhexidine Gluconate Cloth  6 each Topical Q0600   diclofenac Sodium  2 g Topical QID   guaiFENesin  600 mg Oral BID   heparin  5,000 Units Subcutaneous Q8H   heparin sodium (porcine)       hydrALAZINE  25 mg Oral BID   hydrocortisone   Rectal QID   hydrocortisone  25 mg Rectal BID   insulin aspart  0-6 Units Subcutaneous TID WC   insulin glargine-yfgn  5 Units Subcutaneous Daily   levETIRAcetam  375 mg Oral Q M,W,F-1800   levETIRAcetam  750 mg Oral QHS   losartan  50 mg Oral BID   mometasone-formoterol  2 puff Inhalation BID   pantoprazole  40 mg Oral QHS   rosuvastatin   20 mg Oral Daily   sevelamer carbonate  0.8 g Oral TID WC   ticagrelor  90 mg Oral BID   vancomycin  125 mg Oral QID   Continuous Infusions:  LOS: 3 days    Time spent: 45 minutes spent on chart review, discussion with nursing staff, consultants, updating family and interview/physical exam; more than 50% of that time was spent in counseling and/or coordination of care.    Joseph Art, DO [facility] Available via Epic secure chat 7am-7pm After these hours, please refer to coverage provider listed on [email or web address] 02/13/2023, 8:31 AM

## 2023-02-14 ENCOUNTER — Encounter: Payer: Medicare PPO | Admitting: Physical Therapy

## 2023-02-14 ENCOUNTER — Other Ambulatory Visit (HOSPITAL_COMMUNITY): Payer: Self-pay

## 2023-02-14 DIAGNOSIS — R0603 Acute respiratory distress: Secondary | ICD-10-CM | POA: Diagnosis not present

## 2023-02-14 LAB — GLUCOSE, CAPILLARY
Glucose-Capillary: 215 mg/dL — ABNORMAL HIGH (ref 70–99)
Glucose-Capillary: 172 mg/dL — ABNORMAL HIGH (ref 70–99)
Glucose-Capillary: 250 mg/dL — ABNORMAL HIGH (ref 70–99)

## 2023-02-14 LAB — BASIC METABOLIC PANEL
Anion gap: 11 (ref 5–15)
BUN: 29 mg/dL — ABNORMAL HIGH (ref 8–23)
CO2: 25 mmol/L (ref 22–32)
Calcium: 8 mg/dL — ABNORMAL LOW (ref 8.9–10.3)
Chloride: 100 mmol/L (ref 98–111)
Creatinine, Ser: 5.3 mg/dL — ABNORMAL HIGH (ref 0.44–1.00)
GFR, Estimated: 8 mL/min — ABNORMAL LOW (ref >60)
Glucose, Bld: 280 mg/dL — ABNORMAL HIGH (ref 70–99)
Potassium: 3.8 mmol/L (ref 3.5–5.1)
Sodium: 136 mmol/L (ref 135–145)

## 2023-02-14 LAB — CBC
HCT: 28.8 % — ABNORMAL LOW (ref 36.0–46.0)
Hemoglobin: 9.8 g/dL — ABNORMAL LOW (ref 12.0–15.0)
MCH: 30 pg (ref 26.0–34.0)
MCHC: 34 g/dL (ref 30.0–36.0)
MCV: 88.1 fL (ref 80.0–100.0)
Platelets: 420 10*3/uL — ABNORMAL HIGH (ref 150–400)
RBC: 3.27 MIL/uL — ABNORMAL LOW (ref 3.87–5.11)
RDW: 19.8 % — ABNORMAL HIGH (ref 11.5–15.5)
WBC: 11.1 10*3/uL — ABNORMAL HIGH (ref 4.0–10.5)
nRBC: 1.5 % — ABNORMAL HIGH (ref 0.0–0.2)

## 2023-02-14 MED ORDER — CHLORHEXIDINE GLUCONATE CLOTH 2 % EX PADS: 6.0000 | MEDICATED_PAD | Freq: Every day | CUTANEOUS | Status: DC

## 2023-02-14 MED ORDER — HYDROCORTISONE (PERIANAL) 2.5 % EX CREA
TOPICAL_CREAM | Freq: Four times a day (QID) | CUTANEOUS | 0 refills | Status: DC
  Filled 2023-02-14: qty 30, fill #0
  Filled 2023-02-14: qty 30, 14d supply, fill #0

## 2023-02-14 MED ORDER — VANCOMYCIN HCL 125 MG PO CAPS
125.0000 mg | ORAL_CAPSULE | Freq: Four times a day (QID) | ORAL | 0 refills | Status: DC
  Filled 2023-02-14: qty 1, 1d supply, fill #0
  Filled 2023-02-14 (×2): qty 26, 7d supply, fill #0
  Filled 2023-02-14 (×2): qty 25, 6d supply, fill #0
  Filled 2023-02-14: qty 1, 1d supply, fill #0

## 2023-02-14 MED ORDER — ALBUTEROL SULFATE (2.5 MG/3ML) 0.083% IN NEBU
2.5000 mg | INHALATION_SOLUTION | RESPIRATORY_TRACT | 0 refills | Status: DC | PRN
  Filled 2023-02-14: qty 90, 5d supply, fill #0

## 2023-02-14 NOTE — Discharge Summary (Addendum)
Physician Discharge Summary  Carolyn Russo ZOX:[id number] DOB: 11-20-1950 DOA: 02/09/2023  PCP: No primary care provider on file.  Admit date: 02/09/2023 Discharge date: 02/14/2023  Admitted From: home Discharge disposition: home   Recommendations for Outpatient Follow-Up:  Home health Follow up with PCP to ensure resolution of diarrhea Resume HD M/W/F Pulm toilet C/o hemorrhoids-- trial of anusol-- seems to be working but may need a different agent   Discharge Diagnosis:   Principal Problem:   Acute respiratory distress Active Problems:   Anemia in chronic kidney disease   ESRD on dialysis   Type 2 diabetes mellitus   CAD (coronary artery disease), native coronary artery   Chronic diastolic CHF (congestive heart failure)   Hemorrhoids   Cerebrovascular accident (CVA) of right basal ganglia   Below-knee amputation of right lower extremity   Seizure disorder   CHF (congestive heart failure)    Discharge Condition: Improved.  Diet recommendation: renal diet  Wound care: None.  Code status: Full.   History of Present Illness:   Carolyn Russo is a 72 y.o. female with medical history significant of ESRD (MWF), diastolic congestive heart failure, type 2 diabetes, hypertension, CVA, peripheral artery disease who presents for 4 to 5 days of productive coughing with yellow sputum and shortness of breath that has been worsening.  At first, she thought she was having allergy symptoms.  She used her home nebulizer with some relief.  Endorses chest tightness and wheezing.  Feels like she cannot get enough air in her lungs to talk.  States that she is "a big talker".  She has to sleep sitting up right. Denies fever, lower leg swelling, palpitations, new upper extremity numbness and tingling.  Denies history of smoking.   Upon arrival to the ED she was mildly hypertensive, tachypneic but satting well on room air.  BMP is grossly changed from previous.  Mild leukocytosis,  WBC 11.5.  She had normocytic anemia, hemoglobin 8.8.  BNP 331.  Procalcitonin 0.63.  Chest x-ray showing questionable left lung opacity and mild central vascular congestion.  Patient given DuoNebs x 2, Solu-Medrol 125 mg, ceftriaxone and azithromycin.    Hospital Course by Problem:   Acute respiratory distress from parainfluenza virus Chest x-ray with questionable left lower lobe opacity with mild leukocytosis.  Patient started on empiric treatment for community-acquired pneumonia (azithromycin and ceftriaxone).  She does also have a mildly elevated BNP vascular congestion seen on chest x-ray. On chart review, has history of non-obstructive COPD.   - d/c IV antibiotics (CTX and Azithro) -- c diff + and also with parainfluenza virus to explain URI - PRN DuoNebs  -mucinex  -NP swab-- parainfluenza virus -pulm toilet- flutter valve --off O2   ? C diff infection (POA) -testing ordered overnight, + antigen, + loose stools and +toxin -PO vanc x 10 days total -monitor for response -try to limit abx   End-stage renal disease  -MWF outpatient schedule -Nephrology consulted -HD per nephrology 4/22   Acute on chronic Diastolic CHF BNP 811 on admission.  Chest x-ray without cardiomegaly or pleural effusion.  Appears euvolemic on exam.  Follows with Dr. Chilton Si.  Patient reporting worsening shortness of breath, orthopnea, and chest tightness.  Echocardiogram from 06/12/2022 showed LVEF with 6065% with grade 1 diastolic dysfunction - volume management with HD   Anemia of chronic disease Hemoglobin stable -defer to nephrology   Seizure disorder -Continue home Keppra   Hypertension She is hypertensive.   -  Resume home losartan, hydralazine, Coreg    Type 2 diabetes Glucose on admission 125.  Most recent A1c <5 though I suspect this may be inaccurate due to ESRD. -Sliding scale insulin -resume home long acting   Hyperlipidemia -Continue home Crestor    CVA Patient had a CVA  in 2022 of the right basal ganglia with left-sided deficits.  She has been getting outpatient physical therapy.   CAD - continue home Crestor and Brilinta   Hemorrhoids  - Continue Anusol-HC BID     Medical Consultants:   renal   Discharge Exam:   Vitals:   02/14/23 0438 02/14/23 0832  BP: (!) 148/68 (!) 160/64  Pulse: 71 69  Resp: 17 18  Temp: 97.8 F (36.6 C) 97.8 F (36.6 C)  SpO2: 95% 98%   Vitals:   02/13/23 2027 02/14/23 0437 02/14/23 0438 02/14/23 0832  BP: 123/62  (!) 148/68 (!) 160/64  Pulse: 76  71 69  Resp: Temp: 98.5 F (36.9 C)  97.8 F (36.6 C) 97.8 F (36.6 C)  TempSrc: Oral  Oral Oral  SpO2: 100%  95% 98%  Weight:  82.5 kg      General exam: Appears calm and comfortable.    The results of significant diagnostics from this hospitalization (including imaging, microbiology, ancillary and laboratory) are listed below for reference.     Procedures and Diagnostic Studies:   DG Chest Portable 1 View  Result Date: 02/09/2023 CLINICAL DATA:  Shortness of breath EXAM: PORTABLE CHEST 1 VIEW COMPARISON:  X-ray 06/22/2022 and older FINDINGS: Large-bore right IJ catheter in place with tip overlying the right atrium. Film is rotated to the left. Normal cardiopericardial silhouette. Calcified aorta. Central vascular congestion. Subtle left lung base opacity as well. Atelectasis is favored over infiltrate recommend follow-up. No pneumothorax or effusion. Overlapping cardiac leads. IMPRESSION: Stable right IJ catheter. Mild vascular congestion. Bandlike opacity left lung base. Atelectasis is favored over infiltrate. Rotated radiograph Electronically Signed   By: Karen Kays M.D.   On: 02/09/2023 14:34     Labs:   Basic Metabolic Panel: Recent Labs  Lab 02/09/23 1356 02/10/23 0452 02/13/23 0334 02/14/23 0518  NA 138 137 138 136  K 4.3 4.2 3.6 3.8  CL 97* 99 101 100  CO2 GLUCOSE 125* 211* 208* 280*  BUN 35* 46* 47* 29*   CREATININE 6.00* 6.89* 7.48* 5.30*  CALCIUM 8.7* 7.9* 7.7* 8.0*  PHOS  --  6.3*  --   --    GFR Estimated Creatinine Clearance: 9.9 mL/min (A) (by C-G formula based on SCr of 5.3 mg/dL (H)). Liver Function Tests: Recent Labs  Lab 02/10/23 0452  ALBUMIN 3.3*   No results for input(s): "LIPASE", "AMYLASE" in the last 168 hours. No results for input(s): "AMMONIA" in the last 168 hours. Coagulation profile No results for input(s): "INR", "PROTIME" in the last 168 hours.  CBC: Recent Labs  Lab 02/09/23 1356 02/10/23 0452 02/13/23 0334 02/14/23 0518  WBC 11.5* 7.3 11.7* 11.1*  HGB 8.8* 9.0* 8.9* 9.8*  HCT 26.2* 27.0* 27.2* 28.8*  MCV 87.0 87.4 89.8 88.1  PLT 252 274 394 420*   Cardiac Enzymes: No results for input(s): "CKTOTAL", "CKMB", "CKMBINDEX", "TROPONINI" in the last 168 hours. BNP: Invalid input(s): "POCBNP" CBG: Recent Labs  Lab 02/13/23 1301 02/13/23 1646 02/13/23 2027 02/14/23 0831 02/14/23 1233  GLUCAP 113* 202* 234* 215* 172*   D-Dimer No results for input(s): "DDIMER" in the  last 72 hours. Hgb A1c No results for input(s): "HGBA1C" in the last 72 hours. Lipid Profile No results for input(s): "CHOL", "HDL", "LDLCALC", "TRIG", "CHOLHDL", "LDLDIRECT" in the last 72 hours. Thyroid function studies No results for input(s): "TSH", "T4TOTAL", "T3FREE", "THYROIDAB" in the last 72 hours.  Invalid input(s): "FREET3" Anemia work up No results for input(s): "VITAMINB12", "FOLATE", "FERRITIN", "TIBC", "IRON", "RETICCTPCT" in the last 72 hours. Microbiology Recent Results (from the past 240 hour(s))  MRSA Next Gen by PCR, Nasal     Status: None   Collection Time: 02/09/23  7:11 PM   Specimen: Nasal Mucosa; Nasal Swab  Result Value Ref Range Status   MRSA by PCR Next Gen NOT DETECTED NOT DETECTED Final    Comment: (NOTE) The GeneXpert MRSA Assay (FDA approved for NASAL specimens only), is one component of a comprehensive MRSA colonization  surveillance program. It is not intended to diagnose MRSA infection nor to guide or monitor treatment for MRSA infections. Test performance is not FDA approved in patients less than 85 years old. Performed at [facility], [address], [place], [place] [address]   Respiratory (~20 pathogens) panel by PCR     Status: Abnormal   Collection Time: 02/10/23 10:10 AM   Specimen: Nasopharyngeal Swab; Respiratory  Result Value Ref Range Status   Adenovirus NOT DETECTED NOT DETECTED Final   Coronavirus 229E NOT DETECTED NOT DETECTED Final    Comment: (NOTE) The Coronavirus on the Respiratory Panel, DOES NOT test for the novel  Coronavirus (2019 nCoV)    Coronavirus HKU1 NOT DETECTED NOT DETECTED Final   Coronavirus NL63 NOT DETECTED NOT DETECTED Final   Coronavirus OC43 NOT DETECTED NOT DETECTED Final   Metapneumovirus NOT DETECTED NOT DETECTED Final   Rhinovirus / Enterovirus NOT DETECTED NOT DETECTED Final   Influenza A NOT DETECTED NOT DETECTED Final   Influenza B NOT DETECTED NOT DETECTED Final   Parainfluenza Virus 1 NOT DETECTED NOT DETECTED Final   Parainfluenza Virus 2 NOT DETECTED NOT DETECTED Final   Parainfluenza Virus 3 DETECTED (A) NOT DETECTED Final   Parainfluenza Virus 4 NOT DETECTED NOT DETECTED Final   Respiratory Syncytial Virus NOT DETECTED NOT DETECTED Final   Bordetella pertussis NOT DETECTED NOT DETECTED Final   Bordetella Parapertussis NOT DETECTED NOT DETECTED Final   Chlamydophila pneumoniae NOT DETECTED NOT DETECTED Final   Mycoplasma pneumoniae NOT DETECTED NOT DETECTED Final    Comment: Performed at [facility], [address], [place], [place] [address]  C Difficile Quick Screen w PCR reflex     Status: Abnormal   Collection Time: 02/10/23 10:51 PM   Specimen: STOOL  Result Value Ref Range Status   C Diff antigen POSITIVE (A) NEGATIVE Final   C Diff toxin NEGATIVE NEGATIVE Final   C Diff interpretation Results are indeterminate. See PCR  results.  Final    Comment: Performed at [facility], [address], [place], [place] [address]  C. Diff by PCR, Reflexed     Status: Abnormal   Collection Time: 02/10/23 10:51 PM  Result Value Ref Range Status   Toxigenic C. Difficile by PCR POSITIVE (A) NEGATIVE Final    Comment: Positive for toxigenic C. difficile with little to no toxin production. Only treat if clinical presentation suggests symptomatic illness. Performed at [facility], [address], [place], [place] [address]      Discharge Instructions:   Discharge Instructions     Discharge instructions  Complete by: As directed    Renal diet Finish vancomycin treatment Would take scheduled mucinex for now--use flutter valve and incentive spirometry for pulmonary toilet   Increase activity slowly   Complete by: As directed       Allergies as of 02/14/2023       Reactions   Food Anaphylaxis   Peanuts - anaphylaxis Almonds - anaphylaxis Not listed on MAR   Statins Itching, Other (See Comments)   Generalized aches- tolerates crestor  Not listed on MAR   Gadolinium Derivatives Other (See Comments)   Gadolinium-Containing Contrast Media Listed as an allergy on MAR Unknown reaction   Januvia [sitagliptin] Other (See Comments)   Listed as an allergy on MAR Unknown reaction   Pork-derived Products Other (See Comments)   Does not eat pork  Listed on MAR   Shellfish Allergy Other (See Comments)   Mouth gets raw Listed on MAR   Tetracycline Other (See Comments)   Raw mouth Not listed on MAR        Medication List     STOP taking these medications    hydrOXYzine 25 MG capsule Commonly known as: Vistaril   senna-docusate 8.6-50 MG tablet Commonly known as: Senokot-S       TAKE these medications    acetaminophen 500 MG tablet Commonly known as: TYLENOL Take 1,000 mg by mouth 2 (two) times daily as needed (leg and arm pain).   albuterol (2.5 MG/3ML) 0.083% nebulizer  solution Commonly known as: PROVENTIL Take 3 mLs (2.5 mg total) by nebulization every 4 (four) hours as needed for wheezing or shortness of breath.   aspirin EC 81 MG tablet Take 1 tablet (81 mg total) by mouth daily. Swallow whole.   carvedilol 3.125 MG tablet Commonly known as: COREG Take 1 tablet (3.125 mg total) by mouth 2 (two) times daily with a meal.   diclofenac Sodium 1 % Gel Commonly known as: VOLTAREN Apply 2 g topically 4 (four) times daily. To left wrist   Glucagon Emergency 1 MG Kit Inject 1 mg into the vein as needed (CBG of 65mg /dL).   guaifenesin 100 MG/5ML syrup Commonly known as: ROBITUSSIN Take 200 mg by mouth 3 (three) times daily as needed for cough.   hydrALAZINE 25 MG tablet Commonly known as: APRESOLINE Take 1 tablet (25 mg total) by mouth in the morning and at bedtime.   hydrocortisone 2.5 % rectal cream Commonly known as: ANUSOL-HC Place rectally 4 (four) times daily.   insulin aspart 100 UNIT/ML injection Commonly known as: novoLOG Inject 0-6 Units into the skin 3 (three) times daily with meals. CBG < 70: Implement Hypoglycemia Standing Orders and refer to Hypoglycemia Standing Orders sidebar report CBG 70 - 120: 0 units CBG 121 - 150: 0 units CBG 151 - 200: 1 unit CBG 201-250: 2 units CBG 251-300: 3 units CBG 301-350: 4 units CBG 351-400: 5 units CBG > 400: Give 6 units and call MD   insulin glargine 100 UNIT/ML Solostar Pen Commonly known as: LANTUS Inject 5 Units into the skin at bedtime.   levETIRAcetam 750 MG tablet Commonly known as: KEPPRA Take 1 tablet (750 mg total) by mouth at bedtime.   levETIRAcetam 250 MG tablet Commonly known as: KEPPRA Take 1.5 tablets (375 mg total) by mouth every Monday, Wednesday, and Friday at 6 PM.   loratadine 10 MG tablet Commonly known as: CLARITIN Take 10 mg by mouth daily.   losartan 50 MG tablet Commonly known as: COZAAR Take 50 mg  by mouth 2 (two) times daily.   methylphenidate 5 MG  tablet Commonly known as: RITALIN Take 5 mg by mouth daily.   mometasone-formoterol 200-5 MCG/ACT Aero Commonly known as: DULERA Inhale 2 puffs into the lungs 2 (two) times daily.   pantoprazole 40 MG tablet Commonly known as: Protonix Take 1 tablet (40 mg total) by mouth daily. What changed: when to take this   rosuvastatin 20 MG tablet Commonly known as: CRESTOR Take 1 tablet (20 mg total) by mouth daily. What changed: when to take this   sevelamer carbonate 0.8 g Pack packet Commonly known as: RENVELA Take 0.8 g by mouth 3 (three) times daily.   ticagrelor 90 MG Tabs tablet Commonly known as: BRILINTA Take 1 tablet (90 mg total) by mouth 2 (two) times daily.   vancomycin 125 MG capsule Commonly known as: VANCOCIN Take 1 capsule (125 mg total) by mouth 4 (four) times daily.               Durable Medical Equipment  (From admission, onward)           Start     Ordered   02/14/23 1353  For home use only DME Shower stool  Once        02/14/23 1352              Time coordinating discharge: 45 min  Signed:  Joseph Art DO  [facility] 02/14/2023, 1:56 PM

## 2023-02-14 NOTE — Progress Notes (Signed)
D/C order noted. Contacted [facility] to advise clinic of pt's d/c today and that pt should resume care tomorrow.   Faelyn Sigler Renal Navigator [phone] 

## 2023-02-14 NOTE — Progress Notes (Signed)
Occupational Therapy Treatment Patient Details Name: Carolyn Russo MRN: [id number] DOB: 03/20/1951 Today's Date: 02/14/2023   History of present illness 72 y.o. female was admitted 4/18 for cough with yellow sputum, for 4 to 5 days, SOB and noted atelectasis, chest tightness and wheezing.  Restricting conversation for pt, placed on O2 and must sleep sitting up.  CAP dx, with ABT initiated and noted acute CHF.     PMHx:   ESRD (MWF), diastolic congestive heart failure, type 2 diabetes, hypertension, CVA, PAD, right BKA   OT comments  Patient making good progress this treatment session with mod assist to get to EOB and to perform stand pivot transfer to recliner. Patient able to maintain sitting balance on EOB while donning right prosthesis and left shoe. Patient care giver present stating they don't normally use RW for transfers. Patient to continue to be followed by acute OT to address grooming, self feeding, and functional transfers.    Recommendations for follow up therapy are one component of a multi-disciplinary discharge planning process, led by the attending physician.  Recommendations may be updated based on patient status, additional functional criteria and insurance authorization.    Assistance Recommended at Discharge Frequent or constant Supervision/Assistance  Patient can return home with the following  A lot of help with bathing/dressing/bathroom;A lot of help with walking and/or transfers;Assist for transportation;Help with stairs or ramp for entrance   Equipment Recommendations  None recommended by OT    Recommendations for Other Services      Precautions / Restrictions Precautions Precautions: Fall Precaution Comments: monitor O2 sats and HR Required Braces or Orthoses: Other Brace Other Brace: R LE prosthesis Restrictions Weight Bearing Restrictions: No Other Position/Activity Restrictions: wearing prosthesis for up to 5 hours 2x a day now       Mobility Bed  Mobility Overal bed mobility: Needs Assistance Bed Mobility: Supine to Sit Rolling: Mod assist         General bed mobility comments: assistance with raising trunk and BLEs    Transfers Overall transfer level: Needs assistance Equipment used: None Transfers: Sit to/from Stand, Bed to chair/wheelchair/BSC Sit to Stand: Mod assist Stand pivot transfers: Mod assist         General transfer comment: caregiver states they perform stand pivot transfers at home without RW     Balance Overall balance assessment: Needs assistance Sitting-balance support: Feet supported, Bilateral upper extremity supported, Single extremity supported Sitting balance-Leahy Scale: Fair Sitting balance - Comments: able to maintain sitting balance while donning prosthesis and left shoe     Standing balance-Leahy Scale: Poor Standing balance comment: stood for transfer                           ADL either performed or assessed with clinical judgement   ADL Overall ADL's : Needs assistance/impaired     Grooming: Wash/dry hands;Wash/dry face;Oral care;Minimal assistance;Sitting Grooming Details (indicate cue type and reason): required assistance with applying toothpaste             Lower Body Dressing: Total assistance Lower Body Dressing Details (indicate cue type and reason): to donn shoe and right prosthesis Toilet Transfer: Moderate assistance;Stand-pivot Toilet Transfer Details (indicate cue type and reason): simulated to chair                Extremity/Trunk Assessment              Vision       Perception  Praxis      Cognition Arousal/Alertness: Awake/alert Behavior During Therapy: WFL for tasks assessed/performed, Flat affect Overall Cognitive Status: Within Functional Limits for tasks assessed                                 General Comments: perseverated on breakfast, asked often when breakfast would arrived and reminded         Exercises Exercises: General Upper Extremity General Exercises - Upper Extremity Shoulder Flexion: Strengthening, Right, 10 reps, Seated, Theraband Theraband Level (Shoulder Flexion): Level 1 (Yellow) Shoulder Horizontal ABduction: Strengthening, Both, 5 reps, Seated, Theraband Theraband Level (Shoulder Horizontal Abduction): Level 1 (Yellow)    Shoulder Instructions       General Comments personal caregiver present during visit    Pertinent Vitals/ Pain       Pain Assessment Pain Assessment: Faces Faces Pain Scale: No hurt Pain Intervention(s): Monitored during session  Home Living                                          Prior Functioning/Environment              Frequency  Min 2X/week        Progress Toward Goals  OT Goals(current goals can now be found in the care plan section)  Progress towards OT goals: Progressing toward goals  Acute Rehab OT Goals Patient Stated Goal: go home OT Goal Formulation: With patient Time For Goal Achievement: 02/24/23 Potential to Achieve Goals: Good ADL Goals Pt Will Perform Eating: with modified independence;sitting Pt Will Perform Grooming: with supervision;with set-up;sitting Pt Will Transfer to Toilet: with min assist;with min guard assist;stand pivot transfer;bedside commode  Plan Discharge plan remains appropriate    Co-evaluation                 AM-PAC OT "6 Clicks" Daily Activity     Outcome Measure   Help from another person eating meals?: A Little Help from another person taking care of personal grooming?: A Little Help from another person toileting, which includes using toliet, bedpan, or urinal?: Total Help from another person bathing (including washing, rinsing, drying)?: Total Help from another person to put on and taking off regular upper body clothing?: Total Help from another person to put on and taking off regular lower body clothing?: Total 6 Click Score: 10    End of  Session Equipment Utilized During Treatment: Gait belt  OT Visit Diagnosis: Unsteadiness on feet (R26.81);Other abnormalities of gait and mobility (R26.89);Muscle weakness (generalized) (M62.81)   Activity Tolerance Patient tolerated treatment well   Patient Left in chair;with call bell/phone within reach;with family/visitor present   Nurse Communication Mobility status        Time: 8657-8469 OT Time Calculation (min): 32 min  Charges: OT General Charges $OT Visit: 1 Visit OT Treatments $Self Care/Home Management : 8-22 mins $Therapeutic Exercise: 8-22 mins  Alfonse Flavors, OTA Acute Rehabilitation Services  Office [phone]   Dewain Penning 02/14/2023, 10:24 AM

## 2023-02-14 NOTE — Inpatient Diabetes Management (Signed)
Inpatient Diabetes Program Recommendations  AACE/ADA: New Consensus Statement on Inpatient Glycemic Control (2015)  Target Ranges:  Prepandial:   less than 140 mg/dL      Peak postprandial:   less than 180 mg/dL (1-2 hours)      Critically ill patients:  140 - 180 mg/dL   Lab Results  Component Value Date   GLUCAP 215 (H) 02/14/2023   HGBA1C 6.7 (H) 02/10/2023    Review of Glycemic Control  Diabetes history: DM Outpatient Diabetes medications: Lantus 5 units daily, Novolog 0-6 units correction scale TID Current orders for Inpatient glycemic control: Semglee 5 units daily, Novolog 0-6 units correction scale TID  Inpatient Diabetes Program Recommendations:   Noted that patient was ordered Semglee 5 units on 4/22. Per [facility], patient was not given the [facility] due to patient being in hemodialysis.   Will continue to monitor blood sugars while in the hospital.   Smith Mince RN BSN CDE Diabetes Coordinator Pager: [phone]  8am-5pm

## 2023-02-14 NOTE — Progress Notes (Signed)
[facility] Progress Note   Subjective:    HD yest-  removed 2100-  labs reflective of HD. She looks a lot better this AM-  off of o2-  says diarrhea and nausea both better   Objective Vitals:   02/13/23 1944 02/13/23 2027 02/14/23 0437 02/14/23 0438  BP:  123/62  (!) 148/68  Pulse: 80 76  71  Resp: Temp:  98.5 F (36.9 C)  97.8 F (36.6 C)  TempSrc:  Oral  Oral  SpO2: 97% 100%  95%  Weight:   82.5 kg    Physical Exam General: Awake, alert Heart: S1 and S2; No MRGs Lungs: Congested anteriorly with diffuse rales Abdomen: Soft and non-tender Extremities: No LE edema Dialysis Access: R [facility]   Filed Weights   02/13/23 0500 02/13/23 0849 02/14/23 0437  Weight: 81.2 kg 77.2 kg 82.5 kg    Intake/Output Summary (Last 24 hours) at 02/14/2023 0802 Last data filed at 02/13/2023 1240 Gross per 24 hour  Intake --  Output 2100 ml  Net -2100 ml     Additional Objective Labs: Basic Metabolic Panel: Recent Labs  Lab 02/10/23 0452 02/13/23 0334 02/14/23 0518  NA 137 138 136  K 4.2 3.6 3.8  CL 99 101 100  CO2 GLUCOSE 211* 208* 280*  BUN 46* 47* 29*  CREATININE 6.89* 7.48* 5.30*  CALCIUM 7.9* 7.7* 8.0*  PHOS 6.3*  --   --    Liver Function Tests: Recent Labs  Lab 02/10/23 0452  ALBUMIN 3.3*   No results for input(s): "LIPASE", "AMYLASE" in the last 168 hours. CBC: Recent Labs  Lab 02/09/23 1356 02/10/23 0452 02/13/23 0334 02/14/23 0518  WBC 11.5* 7.3 11.7* 11.1*  HGB 8.8* 9.0* 8.9* 9.8*  HCT 26.2* 27.0* 27.2* 28.8*  MCV 87.0 87.4 89.8 88.1  PLT 252 274 394 420*   Blood Culture    Component Value Date/Time   SDES BLOOD RIGHT HAND 06/22/2022 1332   SPECREQUEST  06/22/2022 1332    AEROBIC BOTTLE ONLY Blood Culture results may not be optimal due to an inadequate volume of blood received in culture bottles   CULT  06/22/2022 1332    NO GROWTH 5 DAYS Performed at [facility], [address] [place], [place]  [address]    REPTSTATUS 06/27/2022 FINAL 06/22/2022 1332    Cardiac Enzymes: No results for input(s): "CKTOTAL", "CKMB", "CKMBINDEX", "TROPONINI" in the last 168 hours. CBG: Recent Labs  Lab 02/12/23 1631 02/12/23 2028 02/13/23 1301 02/13/23 1646 02/13/23 2027  GLUCAP 153* 221* 113* 202* 234*   Iron Studies: No results for input(s): "IRON", "TIBC", "TRANSFERRIN", "FERRITIN" in the last 72 hours. Lab Results  Component Value Date   INR 1.0 06/22/2022   INR 1.2 05/10/2022   INR 1.1 04/13/2022   Studies/Results: DG CHEST PORT 1 VIEW  Result Date: 02/12/2023 CLINICAL DATA:  Shortness of breath EXAM: PORTABLE CHEST 1 VIEW COMPARISON:  02/09/2023 FINDINGS: Right IJ approach hemodialysis catheter remains positioned at the level of the right atrium. Heart size within normal limits. Aortic atherosclerosis. Mild vascular congestion. No focal airspace consolidation, pleural effusion, or pneumothorax. IMPRESSION: Mild vascular congestion. No focal airspace consolidation. Electronically Signed   By: Duanne Guess D.O.   On: 02/12/2023 13:14    Medications:   aspirin EC  81 mg Oral Daily   carvedilol  3.125 mg Oral BID WC   Chlorhexidine Gluconate Cloth  6 each Topical Q0600  diclofenac Sodium  2 g Topical QID   doxercalciferol  4 mcg Intravenous Q M,W,F-HD   guaiFENesin  600 mg Oral BID   heparin  5,000 Units Subcutaneous Q8H   hydrALAZINE  25 mg Oral BID   hydrocortisone   Rectal QID   hydrocortisone  25 mg Rectal BID   insulin aspart  0-6 Units Subcutaneous TID WC   insulin glargine-yfgn  5 Units Subcutaneous Daily   levETIRAcetam  375 mg Oral Q M,W,F-1800   levETIRAcetam  750 mg Oral QHS   losartan  50 mg Oral BID   mometasone-formoterol  2 puff Inhalation BID   pantoprazole  40 mg Oral QHS   rosuvastatin  20 mg Oral Daily   sevelamer carbonate  0.8 g Oral TID WC   ticagrelor  90 mg Oral BID   vancomycin  125 mg Oral QID    Dialysis Orders: [facility] MWF   4h 350/1.5  82.7kg   2/2 bath RIJ TDC  Heparin 2600 - last HD 4/17, post wt 82.1kg   - hectorol IV 4 mcg tiw - venofer  tiw thru 4/24 - mircera 200 mcg IV q 2 wks, last 4/17   CXR - question LLL atx vs infiltrate, mild vasc congestion   Home meds include - asa, coreg 3.125 bid, hydralazine 25 bid,  keppra, losartan 50 bid, ritalin, dulera, protonix, crestor, renvela 800 ac tid, brilinta  Assessment/Plan: SOB/ cough - w/ abnormal exam (diffuse rales, minimal CXR changes, not hypoxic) and she is under dry wt, no edema on exam. May have viral lung infection. Getting abx for possible bact PNA, now stopped-  UF as able, likely to have lower EDW at discharge.  ESRD - on HD MWF. Next HD 4/24-  either here or at OP center  HTN/ volume - under dry wt, no vol ^ on exam and BP is high , will lower at discharge.  Anemia esrd - Hb 8-10 here, just had esa on 4/17 at OP unit. Follow.  MBD ckd - CCa in range and phos a little high. Cont renvela powder and IV vdra w/ HD.  Seizure d/o - keppra DM2 - on insulin H/o CVA CAD - on brilinta Dispo-  looks a lot better-   maybe approaching discharge ?  Per primary   Cecille Aver  [facility] 02/14/2023,8:02 AM  LOS: 4 days

## 2023-02-14 NOTE — TOC Transition Note (Signed)
Transition of Care [facility]) - CM/SW Discharge Note   Patient Details  Name: Carolyn Russo MRN: [id number] Date of Birth: 15-Mar-1951  Transition of Care [facility] CM/SW Contact:  Janae Bridgeman, RN Phone Number: 02/14/2023, 4:54 PM   Clinical Narrative:    CM met with the patient prior to discharge and patient states that her husband is picking her up by car and transporting her home by car.  The patient could not remember being active with home health company.  I called [facility] and spoke with Kandee Keen and he accepted for home health services.  HH orders active.   Final next level of care: Home w Home Health Services Barriers to Discharge: No Barriers Identified   Patient Goals and CMS Choice CMS Medicare.gov Compare Post Acute Care list provided to:: Patient Choice offered to / list presented to : Patient  Discharge Placement                         Discharge Plan and Services Additional resources added to the After Visit Summary for     Discharge Planning Services: CM Consult Post Acute Care Choice: Home Health                    HH Arranged: PT, OT [facility] Agency: [facility] Date [facility] Agency Contacted: 02/14/23 Time HH Agency Contacted: 1653 Representative spoke with at [facility] Agency: [facility]  Social Determinants of Health (SDOH) Interventions SDOH Screenings   Food Insecurity: No Food Insecurity (02/10/2023)  Housing: Low Risk  (02/09/2023)  Transportation Needs: No Transportation Needs (02/09/2023)  Utilities: Not At Risk (02/09/2023)  Alcohol Screen: Low Risk  (01/09/2019)  Depression (PHQ2-9): Low Risk  (12/09/2022)  Financial Resource Strain: Low Risk  (09/02/2021)  Physical Activity: Inactive (09/02/2021)  Social Connections: Socially Integrated (01/09/2019)  Stress: Stress Concern Present (09/02/2021)  Tobacco Use: Low Risk  (02/10/2023)     Readmission Risk Interventions    02/14/2023    4:54 PM  Readmission Risk Prevention Plan   Transportation Screening Complete  PCP or Specialist Appt within 3-5 Days Complete  HRI or Home Care Consult Complete  Social Work Consult for Recovery Care Planning/Counseling Complete  Palliative Care Screening Complete  Medication Review [employer]) Complete

## 2023-02-14 NOTE — Progress Notes (Signed)
Physical Therapy Treatment Patient Details Name: Carolyn Russo MRN: [id number] DOB: 24-Oct-1951 Today's Date: 02/14/2023   History of Present Illness 72 y.o. female was admitted 4/18 for cough with yellow sputum, for 4 to 5 days, SOB and noted atelectasis, chest tightness and wheezing.  Restricting conversation for pt, placed on O2 and must sleep sitting up.  CAP dx, with ABT initiated and noted acute CHF.     PMHx:   ESRD (MWF), diastolic congestive heart failure, type 2 diabetes, hypertension, CVA, PAD, right BKA    PT Comments    Pt admitted with above diagnosis. Pt was able to stand to RW several times and perform weight shifting and some right LE exercises with mod assist to rise and min to min guard assist to stand with rW statically. Pt needs max encouragement to participate as she wanted to sit down and needed cues to stay on task. Pt caregiver states that pt fatigues quickly and has had difficulty participating fully with therapy in past. Will continue to progress pt as able.  Pt currently with functional limitations due to balance and endurance deficits. Pt will benefit from acute skilled PT to increase their independence and safety with mobility to allow discharge.      Recommendations for follow up therapy are one component of a multi-disciplinary discharge planning process, led by the attending physician.  Recommendations may be updated based on patient status, additional functional criteria and insurance authorization.  Follow Up Recommendations       Assistance Recommended at Discharge Frequent or constant Supervision/Assistance  Patient can return home with the following A lot of help with walking and/or transfers;A lot of help with bathing/dressing/bathroom;Assistance with cooking/housework;Direct supervision/assist for medications management;Direct supervision/assist for financial management;Assist for transportation;Help with stairs or ramp for entrance   Equipment  Recommendations  None recommended by PT    Recommendations for Other Services       Precautions / Restrictions Precautions Precautions: Fall Precaution Comments: monitor O2 sats and HR Required Braces or Orthoses: Other Brace Other Brace: R LE prosthesis Restrictions Weight Bearing Restrictions: No Other Position/Activity Restrictions: wearing prosthesis for up to 5 hours 2x a day now     Mobility  Bed Mobility               General bed mobility comments: Pt in chair on arrival    Transfers Overall transfer level: Needs assistance Equipment used: None Transfers: Sit to/from Stand, Bed to chair/wheelchair/BSC Sit to Stand: Mod assist           General transfer comment: caregiver states they perform stand pivot transfers at home without RW.  Practiced sit to stands to RW with pt doing best when she has assist to place left UE on RW prior to standing and pushes up with right UE on chair.  Takes incr time and assist to stand with pt needing cues for forward translation.  Once standing, pt tends to lean posteriorly unless cued.  With cues, pt can achieve upright stance with RW with min guard assist. Pt was able to weight shift right and left 20 x. Pt can pick up right LE 20 x however cannot weight shift onto right prosthesis to pick up left LE due to c/o right LE pain.  Pt loses balance easily in standing needing mod or max assist to recover. Pt agitated a few times during standing as she tends to want to sit down stating she is fatigued, however with encouragement and cues can keep working  with therapy. Caregiver present and states this has been an obstacle to her therapy as pt "gives up" easily.    Ambulation/Gait               General Gait Details: deferred   Stairs             Wheelchair Mobility    Modified Rankin (Stroke Patients Only)       Balance           Standing balance support: Bilateral upper extremity supported, During functional  activity Standing balance-Leahy Scale: Poor Standing balance comment: Can stand to RW with min assist progressing to min guard for static standing.  Dynamic standing mod to max aasist at times.                            Cognition Arousal/Alertness: Awake/alert Behavior During Therapy: WFL for tasks assessed/performed, Flat affect Overall Cognitive Status: Within Functional Limits for tasks assessed                                          Exercises General Exercises - Upper Extremity Shoulder Flexion: Strengthening, Right, 10 reps, Seated, Theraband Theraband Level (Shoulder Flexion): Level 1 (Yellow) Shoulder Horizontal ABduction: Strengthening, Both, 5 reps, Seated, Theraband Theraband Level (Shoulder Horizontal Abduction): Level 1 (Yellow) General Exercises - Lower Extremity Long Arc Quad: AROM, Both, 10 reps, Seated Hip Flexion/Marching: AROM, Both, 10 reps, Seated Other Exercises Other Exercises: weight shifting bilaterally and marching right Le    General Comments General comments (skin integrity, edema, etc.): personal caregiver present during visit      Pertinent Vitals/Pain Pain Assessment Pain Assessment: No/denies pain    Home Living                          Prior Function            PT Goals (current goals can now be found in the care plan section) Acute Rehab PT Goals Patient Stated Goal: to get back to walking Progress towards PT goals: Progressing toward goals    Frequency    Min 3X/week      PT Plan Current plan remains appropriate    Co-evaluation              AM-PAC PT "6 Clicks" Mobility   Outcome Measure  Help needed turning from your back to your side while in a flat bed without using bedrails?: A Lot Help needed moving from lying on your back to sitting on the side of a flat bed without using bedrails?: A Lot Help needed moving to and from a bed to a chair (including a wheelchair)?: A  Lot Help needed standing up from a chair using your arms (e.g., wheelchair or bedside chair)?: A Lot Help needed to walk in hospital room?: Total Help needed climbing 3-5 steps with a railing? : Total 6 Click Score: 10    End of Session Equipment Utilized During Treatment: Gait belt Activity Tolerance: Patient limited by fatigue Patient left: with call bell/phone within reach;with family/visitor present;in chair;with chair alarm set Nurse Communication: Mobility status;Need for lift equipment PT Visit Diagnosis: Unsteadiness on feet (R26.81);Muscle weakness (generalized) (M62.81);Difficulty in walking, not elsewhere classified (R26.2)     Time: 1610-9604 PT Time Calculation (min) (ACUTE ONLY): 30 min  Charges:  $  Therapeutic Exercise: 8-22 mins $Therapeutic Activity: 8-22 mins                     [facility] M,PT Acute Rehab Services [phone]    Bevelyn Buckles 02/14/2023, 11:46 AM

## 2023-02-15 ENCOUNTER — Telehealth: Payer: Self-pay | Admitting: Nephrology

## 2023-02-15 ENCOUNTER — Encounter: Payer: Self-pay | Admitting: Gastroenterology

## 2023-02-15 ENCOUNTER — Ambulatory Visit: Payer: Medicare PPO | Admitting: Gastroenterology

## 2023-02-15 VITALS — BP 130/68 | HR 77 | Ht 63.0 in | Wt 180.0 lb

## 2023-02-15 DIAGNOSIS — A0472 Enterocolitis due to Clostridium difficile, not specified as recurrent: Secondary | ICD-10-CM | POA: Diagnosis not present

## 2023-02-15 DIAGNOSIS — K648 Other hemorrhoids: Secondary | ICD-10-CM | POA: Diagnosis not present

## 2023-02-15 DIAGNOSIS — R110 Nausea: Secondary | ICD-10-CM | POA: Diagnosis not present

## 2023-02-15 MED ORDER — PROCHLORPERAZINE MALEATE 10 MG PO TABS: 10.0000 mg | ORAL_TABLET | Freq: Three times a day (TID) | ORAL | 2 refills | Status: DC | PRN

## 2023-02-15 NOTE — Progress Notes (Signed)
[facility] Consult Note:  History: Carolyn Russo 02/15/2023  Referring provider: Dorothyann Peng, MD  Reason for consult/chief complaint: Nausea (Pt states has server nausea. ) and Hemorrhoids (Pt states her hemorrhoid are irritate and itchy.)  Subjective  Patient has history of chronic diastolic heart failure, end-stage renal disease, and GERD. Patient has type 2 diabetes and underwent below the knee amputation of the right leg.  HPI: Patient presents to clinic today for an evaluation of nausea and vomiting per the request of FNP Adjara Issifu after clinic visit in early March. She was admitted to the hospital on 02/09/2023 for cough and dyspnea, diagnosed with viral bronchitis or pneumonia, treated with steroids, nebulizers and antibiotics.  She also had diarrhea, stool testing showed positive C. difficile PCR, positive GDH antigen and negative toxin.  She was treated with a 10-day course of vancomycin and discharged from the hospital yesterday.  This is a former patient of Dr. Arlyce Dice when he was evaluating her for chronic left lower quadrant pain.  I saw her in June 2019 for a screening colonoscopy.  A few days after that she was seen in the office for nausea and vomiting and abdominal pain since the procedure, exam was benign, treated symptomatically. _________________________________  She is accompanied by her care giver today. She had dialysis today.   Today, she complains of diarrhea and sever nausea and states it occurs during and after having her dialysis.  It started when dialysis began years ago, and she was told it would likely settle down after about a year but has not.  She states that's he takes Zofran but it doesn't seem to help or might even make it worse and states that she occasionally takes it while taking her dialysis to help calm down the nausea. She also complains of hemorrhoids that are causing discomfort and itchiness and is worsened with the  current diarrhea.  No recent rectal bleeding.  We reviewed her recent ED visit and discussed her recent diagnosis of C.difficile.   She reports having a good appetite and states that se eats normally, especially breakfast as skipping it causes her to be sick.     ROS: Review of Systems  Constitutional:  Negative for appetite change, fever and unexpected weight change.  HENT:  Negative for trouble swallowing.   Respiratory:  Negative for cough and shortness of breath.   Cardiovascular:  Negative for chest pain.  Gastrointestinal:  Positive for diarrhea, nausea and rectal pain (itchiness and discomfort). Negative for abdominal distention, abdominal pain, anal bleeding, blood in stool, constipation and vomiting.  Genitourinary:  Negative for dysuria.  Musculoskeletal:  Negative for back pain.  Skin:  Negative for rash.  Neurological:  Negative for weakness.  All other systems reviewed and are negative.   Past Medical History: Past Medical History:  Diagnosis Date   Acute GI bleeding    Allergy    Anemia    Anterior chest wall pain    Appendicitis 1965   Asthma    Body mass index 37.0-37.9, adult    Breast pain    Cataract    both eyes   CHF (congestive heart failure)    Chronic kidney disease    stage 5 - on dialysis   Cognitive change 04/20/2021   r/t cva 03/2821   Complication of anesthesia    Memory loss after general   Dehydration 2014   Deviated septum 1971   Diabetes mellitus    Dysphagia due to old stroke  easy to get strangled when eating   Dyspnea 2014   Extrinsic asthma    WITH ASTHMA ATTACK   Fibroid 1980   GERD (gastroesophageal reflux disease)    Heart murmur    History of migraine    History of seizure    with stroke   Hx gestational diabetes    Hyperlipidemia    Hypertension 2014   Inguinal hernia 1959   Malaise and fatigue 2014   Non-IgE mediated allergic asthma 2014   Obesity    Pelvic pain    Pregnancy, high-risk 1985   Stroke 04/20/2021    (CVA) of right basal ganglia   Tonsillitis 1968   Uterine fibroid 1980   Visual field defect    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     Past Surgical History: Past Surgical History:  Procedure Laterality Date   ABDOMINAL AORTOGRAM W/LOWER EXTREMITY N/A 02/21/2022   Procedure: ABDOMINAL AORTOGRAM W/LOWER EXTREMITY;  Surgeon: Maeola Harman, MD;  Location: [facility] INVASIVE CV LAB;  Service: Cardiovascular;  Laterality: N/A;   AMPUTATION Right 05/18/2022   Procedure: RIGHT BELOW KNEE AMPUTATION;  Surgeon: Nadara Mustard, MD;  Location: [facility] OR;  Service: Orthopedics;  Laterality: Right;   AMPUTATION TOE Right 04/15/2022   Procedure: AMPUTATION  LST TOERIGHT FOOT;  Surgeon: Edwin Cap, DPM;  Location: [facility] ORS;  Service: Podiatry;  Laterality: Right;   APPENDECTOMY  1959   BASCILIC VEIN TRANSPOSITION Right 04/30/2021   Procedure: RIGHT FIRST STAGE BASCILIC VEIN TRANSPOSITION;  Surgeon: Chuck Hint, MD;  Location: [facility] OR;  Service: Vascular;  Laterality: Right;   CESAREAN SECTION  1985   COLONOSCOPY     ESOPHAGOGASTRODUODENOSCOPY (EGD) WITH PROPOFOL N/A 04/18/2021   Procedure: ESOPHAGOGASTRODUODENOSCOPY (EGD) WITH PROPOFOL;  Surgeon: Iva Boop, MD;  Location: [facility] ENDOSCOPY;  Service: Endoscopy;  Laterality: N/A;   EYE SURGERY     bilateral cataract    FLEXIBLE SIGMOIDOSCOPY N/A 04/18/2021   Procedure: FLEXIBLE SIGMOIDOSCOPY;  Surgeon: Iva Boop, MD;  Location: [facility] ENDOSCOPY;  Service: Endoscopy;  Laterality: N/A;   HERNIA REPAIR  1959   IR FLUORO GUIDE CV LINE RIGHT  03/18/2021   IR US GUIDE VASC ACCESS RIGHT  03/18/2021   LEFT HEART CATH AND CORONARY ANGIOGRAPHY N/A 04/04/2017   Procedure: Left Heart Cath and Coronary Angiography;  Surgeon: Lyn Records, MD;  Location: [facility] INVASIVE CV LAB;  Service: Cardiovascular;  Laterality: N/A;   MYOMECTOMY  1980, 2004, 2007   PERIPHERAL VASCULAR THROMBECTOMY  02/21/2022   Procedure: PERIPHERAL VASCULAR THROMBECTOMY;  Surgeon: Maeola Harman, MD;  Location: [facility] INVASIVE CV LAB;  Service: Cardiovascular;;  Right Tibial   RHINOPLASTY  1971   ROTATOR CUFF REPAIR  2003   SURGICAL REPAIR OF HEMORRHAGE  2015   TONSILLECTOMY  1968     Family History: Family History  Problem Relation Age of Onset   Cancer Mother        abdominal melamona   Psoriasis Mother    Alzheimer's disease Father    Diabetes Maternal Aunt    Cancer Cousin        colon    Diabetes Cousin    Colon cancer Neg Hx    Colon polyps Neg Hx    Esophageal cancer Neg Hx    Cancer - Colon Neg Hx    Liver disease Neg Hx     Social History: Social History   Socioeconomic History   Marital status: Married    Spouse name: Channing Mutters  Number of children: 1   Years of education: Not on file   Highest education level: Professional school degree (e.g., MD, DDS, DVM, JD)  Occupational History   Occupation: retired  Tobacco Use   Smoking status: Never   Smokeless tobacco: Never  Vaping Use   Vaping Use: Never used  Substance and Sexual Activity   Alcohol use: No   Drug use: No   Sexual activity: Not Currently    Birth control/protection: Post-menopausal  Other Topics Concern   Not on file  Social History Narrative   06/21/21 lives with husband Dr Chalmers Guest, caregiver- Joyce Gross   Patient reports she used to walk at [employer] but now due to Covid-19 she is staying inside.   Social Determinants of Health   Financial Resource Strain: Low Risk  (09/02/2021)   Overall Financial Resource Strain (CARDIA)    Difficulty of Paying Living Expenses: Not hard at all  Food Insecurity: No Food Insecurity (02/10/2023)   Hunger Vital Sign    Worried About Running Out of Food in the Last Year: Never true    Ran Out of Food in the Last Year: Never true  Transportation Needs: No Transportation Needs (02/09/2023)   PRAPARE - Administrator, Civil Service (Medical): No    Lack of Transportation (Non-Medical): No  Physical Activity: Inactive (09/02/2021)    Exercise Vital Sign    Days of Exercise per Week: 0 days    Minutes of Exercise per Session: 0 min  Stress: Stress Concern Present (09/02/2021)   [employer] of Occupational Health - Occupational Stress Questionnaire    Feeling of Stress : To some extent  Social Connections: Socially Integrated (01/09/2019)   Social Connection and Isolation Panel [NHANES]    Frequency of Communication with Friends and Family: More than three times a week    Frequency of Social Gatherings with Friends and Family: More than three times a week    Attends Religious Services: More than 4 times per year    Active Member of [employer] or Organizations: Yes    Attends Engineer, structural: More than 4 times per year    Marital Status: Married    Allergies: Allergies  Allergen Reactions   Food Anaphylaxis    Peanuts - anaphylaxis Almonds - anaphylaxis Not listed on MAR   Statins Itching and Other (See Comments)    Generalized aches- tolerates crestor  Not listed on MAR   Gadolinium Derivatives Other (See Comments)    Gadolinium-Containing Contrast Media Listed as an allergy on MAR Unknown reaction   Januvia [Sitagliptin] Other (See Comments)    Listed as an allergy on MAR Unknown reaction   Pork-Derived Products Other (See Comments)    Does not eat pork  Listed on MAR   Shellfish Allergy Other (See Comments)    Mouth gets raw Listed on MAR   Tetracycline Other (See Comments)    Raw mouth Not listed on MAR    Outpatient Meds: Current Outpatient Medications  Medication Sig Dispense Refill   acetaminophen (TYLENOL) 500 MG tablet Take 1,000 mg by mouth 2 (two) times daily as needed (leg and arm pain).     albuterol (PROVENTIL) (2.5 MG/3ML) 0.083% nebulizer solution Use 1 vial (2.5 mg total) by nebulization every 4 (four) hours as needed for wheezing or shortness of breath. 90 mL 0   aspirin EC 81 MG tablet Take 1 tablet (81 mg total) by mouth daily. Swallow whole. 30 tablet 12    carvedilol (  COREG) 3.125 MG tablet Take 1 tablet (3.125 mg total) by mouth 2 (two) times daily with a meal. 180 tablet 1   diclofenac Sodium (VOLTAREN) 1 % GEL Apply 2 g topically 4 (four) times daily. To left wrist 150 g 0   Glucagon, rDNA, (GLUCAGON EMERGENCY) 1 MG KIT Inject 1 mg into the vein as needed (CBG of 65mg /dL).     guaifenesin (ROBITUSSIN) 100 MG/5ML syrup Take 200 mg by mouth 3 (three) times daily as needed for cough.     hydrALAZINE (APRESOLINE) 25 MG tablet Take 1 tablet (25 mg total) by mouth in the morning and at bedtime. 180 tablet 3   hydrocortisone (ANUSOL-HC) 2.5 % rectal cream Place rectally 4 (four) times daily. 30 g 0   insulin aspart (NOVOLOG) 100 UNIT/ML injection Inject 0-6 Units into the skin 3 (three) times daily with meals. CBG < 70: Implement Hypoglycemia Standing Orders and refer to Hypoglycemia Standing Orders sidebar report CBG 70 - 120: 0 units CBG 121 - 150: 0 units CBG 151 - 200: 1 unit CBG 201-250: 2 units CBG 251-300: 3 units CBG 301-350: 4 units CBG 351-400: 5 units CBG > 400: Give 6 units and call MD     insulin glargine (LANTUS) 100 UNIT/ML Solostar Pen Inject 5 Units into the skin at bedtime. 15 mL    levETIRAcetam (KEPPRA) 250 MG tablet Take 1.5 tablets (375 mg total) by mouth every Monday, Wednesday, and Friday at 6 PM.     levETIRAcetam (KEPPRA) 750 MG tablet Take 1 tablet (750 mg total) by mouth at bedtime.     loratadine (CLARITIN) 10 MG tablet Take 10 mg by mouth daily.     losartan (COZAAR) 50 MG tablet Take 50 mg by mouth 2 (two) times daily.     methylphenidate (RITALIN) 5 MG tablet Take 5 mg by mouth daily.     mometasone-formoterol (DULERA) 200-5 MCG/ACT AERO Inhale 2 puffs into the lungs 2 (two) times daily. 3 each 2   pantoprazole (PROTONIX) 40 MG tablet Take 1 tablet (40 mg total) by mouth daily. (Patient taking differently: Take 40 mg by mouth at bedtime.) 90 tablet 2   rosuvastatin (CRESTOR) 20 MG tablet Take 1 tablet (20 mg total) by  mouth daily. (Patient taking differently: Take 20 mg by mouth at bedtime.) 90 tablet 2   sevelamer carbonate (RENVELA) 0.8 g PACK packet Take 0.8 g by mouth 3 (three) times daily.     ticagrelor (BRILINTA) 90 MG TABS tablet Take 1 tablet (90 mg total) by mouth 2 (two) times daily. 60 tablet 3   vancomycin (VANCOCIN) 125 MG capsule Take 1 capsule (125 mg total) by mouth 4 (four) times daily. 26 capsule 0   No current facility-administered medications for this visit.      ___________________________________________________________________ Objective   Exam:  BP 130/68   Pulse 77   Ht 5\' 3"  (1.6 m)   Wt 180 lb (81.6 kg)   BMI 31.89 kg/m  Wt Readings from Last 3 Encounters:  02/15/23 180 lb (81.6 kg)  02/14/23 181 lb 14.1 oz (82.5 kg)  01/10/23 182 lb (82.6 kg)   Chronically ill woman in a wheelchair (limited exam), somnolent during the visit (she had dialysis earlier today) Right BKA with prosthesis Eyes: sclera anicteric, no redness ENT: oral mucosa moist without lesions CV: RRR,no left leg edema Resp: clear to auscultation bilaterally, normal RR and effort noted GI: soft, no tenderness, with active bowel sounds. No guarding or palpable organomegaly noted.  No bruit Skin; warm and dry, no rash or jaundice noted   Labs:    Latest Ref Rng & Units 02/14/2023    5:18 AM 02/13/2023    3:34 AM 02/10/2023    4:52 AM  CBC  WBC 4.0 - 10.5 K/uL 11.1  11.7  7.3   Hemoglobin 12.0 - 15.0 g/dL 9.8  8.9  9.0   Hematocrit 36.0 - 46.0 % 28.8  27.2  27.0   Platelets 150 - 400 K/uL 420  394  274       Latest Ref Rng & Units 02/14/2023    5:18 AM 02/13/2023    3:34 AM 02/10/2023    4:52 AM  CMP  Glucose 70 - 99 mg/dL 161  096  045   BUN 8 - 23 mg/dL 29  47  46   Creatinine 0.44 - 1.00 mg/dL 4.09  8.11  9.14   Sodium 135 - 145 mmol/L 136  138  137   Potassium 3.5 - 5.1 mmol/L 3.8  3.6  4.2   Chloride 98 - 111 mmol/L 100  101  99   CO2 22 - 32 mmol/L 25  22  24    Calcium 8.9 - 10.3  mg/dL 8.0  7.7  7.9    In recent EKG, QTC were normal.    Radiologic Studies:  ECHO 06-04-22  1 Left ventricular ejection fraction, by estimation, is 60 to 65%. The left ventricle has normal function. The left ventricle has no regional wall motion abnormalities. Left ventricular diastolic parameters are consistent with Grade I diastolic dysfunction (impaired relaxation).  2. Right ventricular systolic function is normal. The right ventricular size is normal. There is mildly elevated pulmonary artery systolic pressure. The estimated right ventricular systolic pressure is 39.4 mmHg.  3. The mitral valve is grossly normal. No evidence of mitral valve regurgitation. No evidence of mitral stenosis.  4. The aortic valve is tricuspid. There is mild calcification of the aortic valve. There is mild thickening of the aortic valve. Aortic valve regurgitation is not visualized. Aortic valve sclerosis is present, with no evidence of aortic valve stenosis.  5. The inferior vena cava is dilated in size with >50% respiratory variability, suggesting right atrial pressure of 8 mmHg.  CT ABDOMEN PELVIS FINDINGS 05-22-22   Hepatobiliary: The liver and gallbladder are unremarkable. There is no evidence of intrahepatic or extrahepatic biliary dilatation.   Pancreas: Unremarkable   Spleen: Unremarkable   Adrenals/Urinary Tract: Mildly atrophic kidneys noted. There is no evidence of renal mass or hydronephrosis. The adrenal glands and bladder are unremarkable.   Stomach/Bowel: A moderate hiatal hernia is noted. There is no evidence of bowel obstruction, definite bowel wall thickening or inflammatory changes. The appendix is not identified but there is no evidence of pericecal inflammation.   Vascular/Lymphatic: Aortic atherosclerosis. No enlarged abdominal or pelvic lymph nodes.   Reproductive: Calcified fibroids are again noted. No other significant abnormality identified.   Other: No ascites, focal  collection or pneumoperitoneum. A small umbilical hernia containing fat is again noted.   Musculoskeletal: No acute or suspicious bony abnormalities are noted. Lumbar spine levoscoliosis and degenerative changes again noted.   IMPRESSION: 1. No evidence of acute abnormality within the chest, abdomen or pelvis. 2. Cardiomegaly and coronary artery disease. 3. Moderate hiatal hernia. 4. Small umbilical hernia containing fat. 5. Aortic Atherosclerosis (ICD10-I70.0).  CT ABDOMEN AND PELVIS WITHOUT CONTRAST 04-17-22   TECHNIQUE: Multidetector CT imaging of the abdomen and pelvis was performed following the standard  protocol without IV contrast.   RADIATION DOSE REDUCTION: This exam was performed according to the departmental dose-optimization program which includes automated exposure control, adjustment of the mA and/or kV according to patient size and/or use of iterative reconstruction technique.   COMPARISON:  12/13/2004   FINDINGS: Lower chest: No acute findings.   Hepatobiliary: No mass visualized on this unenhanced exam. Gallbladder is unremarkable. No evidence of biliary ductal dilatation.   Pancreas: No mass or inflammatory process visualized on this unenhanced exam.   Spleen:  Within normal limits in size.   Adrenals/Urinary tract: No evidence of urolithiasis or hydronephrosis. Unremarkable unopacified urinary bladder.   Stomach/Bowel: Moderate size hiatal hernia seen. No evidence of obstruction, inflammatory process, or abnormal fluid collections.   Vascular/Lymphatic: No pathologically enlarged lymph nodes identified. No evidence of abdominal aortic aneurysm. Aortic atherosclerotic calcification incidentally noted.   Reproductive: Several partially calcified uterine fibroids are seen, largest measuring approximately 3 cm. Adnexal regions are unremarkable.   Other: Small epigastric ventral hernia, which contains only omental fat.   Musculoskeletal: No  suspicious bone lesions identified. Moderate lumbar spine degenerative changes and levoscoliosis noted.   IMPRESSION: No evidence of urolithiasis, hydronephrosis, or other acute findings.   Moderate hiatal hernia.   Small epigastric ventral hernia, which contains only omental fat.   Small uterine fibroids, largest measuring 3 cm.  GI Hx: Flexible Sigmoidoscopy 04-18-21 - Hemorrhoids found on perianal exam.  - External and internal hemorrhoids. - Stool in the rectum and in the sigmoid colon.  - No specimens collected.  EGD 04-18-21 - Normal esophagus.  - Normal stomach.  - Normal examined duodenum.  - No specimens collected  Assessment:  Chronic nausea related to dialysis sessions. Perianal itching and burning with known internal hemorrhoids.  Unknown if she has been checked for any rash or skin breakdown with a recent C. difficile diarrhea and hospitalization.  Her nurse Joyce Gross was present for the entire visit, and confirms that she helps clean and change her and she will be certain to evaluate that area later today.  Recommended some Desitin or other barrier cream if needed.  Recently diagnosed C. difficile diarrhea, currently on vancomycin prescribed at the time of hospital discharge.   We know this patient does not have an obstructive cause for the nausea that has been going on for years because of prior upper endoscopy and abdominal imaging.  Dialysis related nausea is often very difficult to contend with.  I will try different antiemetics and see if we can get some improvement.   Plan: -stop Zofran and start compazine 10 mg, 1 tablet every 8 hours if needed.  Take 1 before dialysis  Check in by phone or portal message in I will try to manage this as best I can that way since clinic visits appear to be difficult for her.   Thank you for the courtesy of this consult.  Please call me with any questions or concerns.    CC: Referring provider noted above   I,Safa M  Kadhim,acting as a scribe for Charlie Pitter III, MD.,have documented all relevant documentation on the behalf of Sherrilyn Rist, MD,as directed by  Sherrilyn Rist, MD while in the presence of Sherrilyn Rist, MD.

## 2023-02-15 NOTE — Patient Instructions (Addendum)
_______________________________________________________  If your blood pressure at your visit was 140/90 or greater, please contact your primary care physician to follow up on this.  _______________________________________________________  If you are age 74 or older, your body mass index should be between 23-30. Your Body mass index is 31.89 kg/m. If this is out of the aforementioned range listed, please consider follow up with your Primary Care Provider.  If you are age 25 or younger, your body mass index should be between 19-25. Your Body mass index is 31.89 kg/m. If this is out of the aformentioned range listed, please consider follow up with your Primary Care Provider.   ________________________________________________________  The [facility] providers would like to encourage you to use [facility] : [facility] to communicate with providers for non-urgent requests or questions.  Due to long hold times on the telephone, sending your provider a message by [facility] may be a faster and more efficient way to get a response.  Please allow 48 business hours for a response.  Please remember that this is for non-urgent requests.  _______________________________________________________  Stop the Zofran/ Ondansetron  We have sent Compazine to the pharmacy.  It was a pleasure to see you today!  Thank you for trusting me with your gastrointestinal care!

## 2023-02-15 NOTE — Telephone Encounter (Signed)
Transition of Care - Initial Contact from Inpatient Facility  Date of discharge: 02/14/23 Date of contact: 02/15/23  Method: Phone Spoke to: Patient's husband = caregiver   Patient contacted to discuss transition of care from recent inpatient hospitalization. Patient was admitted to [facility] from .4/18-23/24.. with discharge diagnosis of =Acute respiratory distress from parainfluenza virus ,C diff infection (POA   The discharge medication list was reviewed. Patient understands the changes and has no concerns.   Patient will return to his/her outpatient HD unit on:   No other concerns at this time.

## 2023-02-16 ENCOUNTER — Telehealth (INDEPENDENT_AMBULATORY_CARE_PROVIDER_SITE_OTHER): Payer: Medicare PPO | Admitting: Family

## 2023-02-16 ENCOUNTER — Encounter: Payer: Medicare PPO | Admitting: Physical Therapy

## 2023-02-16 ENCOUNTER — Encounter (HOSPITAL_BASED_OUTPATIENT_CLINIC_OR_DEPARTMENT_OTHER): Payer: Self-pay | Admitting: Cardiovascular Disease

## 2023-02-16 ENCOUNTER — Encounter (HOSPITAL_BASED_OUTPATIENT_CLINIC_OR_DEPARTMENT_OTHER): Payer: Self-pay | Admitting: Family

## 2023-02-16 VITALS — BP 143/72 | HR 70 | Ht 63.0 in

## 2023-02-16 DIAGNOSIS — I2510 Atherosclerotic heart disease of native coronary artery without angina pectoris: Secondary | ICD-10-CM

## 2023-02-16 DIAGNOSIS — I739 Peripheral vascular disease, unspecified: Secondary | ICD-10-CM | POA: Diagnosis not present

## 2023-02-16 DIAGNOSIS — N186 End stage renal disease: Secondary | ICD-10-CM | POA: Diagnosis not present

## 2023-02-16 DIAGNOSIS — I5032 Chronic diastolic (congestive) heart failure: Secondary | ICD-10-CM

## 2023-02-16 DIAGNOSIS — I10 Essential (primary) hypertension: Secondary | ICD-10-CM

## 2023-02-16 DIAGNOSIS — E785 Hyperlipidemia, unspecified: Secondary | ICD-10-CM

## 2023-02-16 DIAGNOSIS — Z992 Dependence on renal dialysis: Secondary | ICD-10-CM

## 2023-02-16 DIAGNOSIS — Z8673 Personal history of transient ischemic attack (TIA), and cerebral infarction without residual deficits: Secondary | ICD-10-CM

## 2023-02-16 NOTE — Patient Instructions (Addendum)
Carolyn Russo,  It was a pleasure to talk to you today during your video visit! Glad to hear you are feeling better after your recent hospitalization. Your blood pressure was good at recent visit with Dr. Myrtie Neither and in the hospital. Our goal is for your blood pressure to be consistently less than 130/80. Recommend checking once per day at least an hour after your medications. If your readings at home are consistently more than 130/80, please call and let us know.  Best,  Alver Sorrow, NP  ____________________________  Medication Instructions:  Continue current medications.   *If you need a refill on your cardiac medications before your next appointment, please call your pharmacy*  Lab Work/Testing/Procedures: None ordered today  Follow-Up: At [facility], you and your health needs are our priority.  As part of our continuing mission to provide you with exceptional heart care, we have created designated Provider Care Teams.  These Care Teams include your primary Cardiologist (physician) and Advanced Practice Providers (APPs -  Physician Assistants and Nurse Practitioners) who all work together to provide you with the care you need, when you need it.  Your next appointment:   In October with Dr. Duke Salvia or Alver Sorrow, NP.  Please call [phone] to schedule.  Other Instructions  Tips to Measure your Blood Pressure Correctly  Our goal is for your blood pressure to be consistently less than 130/80.  If your readings at home are consistently more than 130/80, please call and let us know.  Here's what you can do to ensure a correct reading:  Don't drink a caffeinated beverage or smoke during the 30 minutes before the test.  Sit quietly for five minutes before the test begins.  During the measurement, sit in a chair with your feet on the floor and your arm supported so your elbow is at about heart level.  The inflatable part of the cuff should completely cover at  least 80% of your upper arm, and the cuff should be placed on bare skin, not over a shirt.  Don't talk during the measurement.   Blood pressure categories  Blood pressure category SYSTOLIC (upper number)  DIASTOLIC (lower number)  Normal Less than 120 mm Hg and Less than 80 mm Hg  Elevated 120-129 mm Hg and Less than 80 mm Hg  High blood pressure: Stage 1 hypertension 130-139 mm Hg or 80-89 mm Hg  High blood pressure: Stage 2 hypertension 140 mm Hg or higher or 90 mm Hg or higher  Hypertensive crisis (consult your doctor immediately) Higher than 180 mm Hg and/or Higher than 120 mm Hg  Source: American Heart Association and American Stroke Association. For more on getting your blood pressure under control, buy Controlling Your Blood Pressure, a Special Health Report from [facility].   Blood Pressure Log   Date   Time  Blood Pressure  Example: Nov 1 9 AM 124/78

## 2023-02-16 NOTE — Progress Notes (Signed)
Virtual Visit via Telephone Note   Because of Carolyn Russo's co-morbid illnesses, she is at least at moderate risk for complications without adequate follow up.  This format is felt to be most appropriate for this patient at this time.  The patient did not have access to video technology/had technical difficulties with video requiring transitioning to audio format only (telephone).  All issues noted in this document were discussed and addressed.  No physical exam could be performed with this format.  Please refer to the patient's chart for her consent to telehealth for [facility].    Date:  02/16/2023   ID:  Carolyn Russo, DOB 09/27/1951, MRN [id number] The patient was identified using 2 identifiers.  Patient Location: Home Provider Location: Office/Clinic   PCP:  Dorothyann Peng, MD   [facility] Providers Cardiologist:  Chilton Si, MD     Evaluation Performed:  Follow-Up Visit  Chief Complaint: Hypertension follow-up  History of Present Illness:    Carolyn Russo is a 72 y.o. female with ESRD on HD MWF, DM2, chronic diastolic heart failure, cognitive impairment, PAD s/p right BKA  Patient of Dr. Katrinka Blazing and has since been established with Dr. Duke Salvia.  Underwent aortogram and laser atherectomy of right posterior tibial with stent 02/2022.  Prior that time she had to amputation.  At visit 04/2022 had gangrenous changes at amputation site and eventually underwent right BKA.  Echocardiogram 06/12/2022 LVEF 60 to 65%, grade 1 diastolic dysfunction.  Last seen by Dr. Duke Salvia 01/10/2023.  Blood pressure was uncontrolled.  Hydralazine 25 mg twice daily added.  Carvedilol, losartan continued.  Admitted 4/18 - 02/14/2023 with acute respiratory distress secondary to parainfluenza virus.  Also noted C. difficile.  Treated with antibiotics, DuoNeb, Mucinex, pulmonary toilet.  Presents today for follow up via phone visit. CNA with her unable to utilize video visit.  Feeling better since discharge from the hospital. Reports BP has been doing "pretty well". BP yesterday at GI was 130/68. BP at dialysis has been "okay" and not dropping as often. Only lightheadedness if she changes positions too quickly in the morning.   Participating in [facility] PT twice per week starting on Tuesday.   No chest pain, pressure, tightness, exertional dyspnea, orthopnea, PND.   Past Medical History:  Diagnosis Date   Acute GI bleeding    Allergy    Anemia    Anterior chest wall pain    Appendicitis 1965   Asthma    Body mass index 37.0-37.9, adult    Breast pain    Cataract    both eyes   CHF (congestive heart failure)    Chronic kidney disease    stage 5 - on dialysis   Cognitive change 04/20/2021   r/t cva 03/2821   Complication of anesthesia    Memory loss after general   Dehydration 2014   Deviated septum 1971   Diabetes mellitus    Dysphagia due to old stroke    easy to get strangled when eating   Dyspnea 2014   Extrinsic asthma    WITH ASTHMA ATTACK   Fibroid 1980   GERD (gastroesophageal reflux disease)    Heart murmur    History of migraine    History of seizure    with stroke   Hx gestational diabetes    Hyperlipidemia    Hypertension 2014   Inguinal hernia 1959   Malaise and fatigue 2014   Non-IgE mediated allergic asthma 2014   Obesity  Pelvic pain    Pregnancy, high-risk 1985   Stroke 04/20/2021   (CVA) of right basal ganglia   Tonsillitis 1968   Uterine fibroid 1980   Visual field defect    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   Past Surgical History:  Procedure Laterality Date   ABDOMINAL AORTOGRAM W/LOWER EXTREMITY N/A 02/21/2022   Procedure: ABDOMINAL AORTOGRAM W/LOWER EXTREMITY;  Surgeon: Maeola Harman, MD;  Location: [facility] INVASIVE CV LAB;  Service: Cardiovascular;  Laterality: N/A;   AMPUTATION Right 05/18/2022   Procedure: RIGHT BELOW KNEE AMPUTATION;  Surgeon: Nadara Mustard, MD;  Location: [facility] OR;  Service: Orthopedics;  Laterality:  Right;   AMPUTATION TOE Right 04/15/2022   Procedure: AMPUTATION  LST TOERIGHT FOOT;  Surgeon: Edwin Cap, DPM;  Location: [facility] ORS;  Service: Podiatry;  Laterality: Right;   APPENDECTOMY  1959   BASCILIC VEIN TRANSPOSITION Right 04/30/2021   Procedure: RIGHT FIRST STAGE BASCILIC VEIN TRANSPOSITION;  Surgeon: Chuck Hint, MD;  Location: [facility] OR;  Service: Vascular;  Laterality: Right;   CESAREAN SECTION  1985   COLONOSCOPY     ESOPHAGOGASTRODUODENOSCOPY (EGD) WITH PROPOFOL N/A 04/18/2021   Procedure: ESOPHAGOGASTRODUODENOSCOPY (EGD) WITH PROPOFOL;  Surgeon: Iva Boop, MD;  Location: [facility] ENDOSCOPY;  Service: Endoscopy;  Laterality: N/A;   EYE SURGERY     bilateral cataract    FLEXIBLE SIGMOIDOSCOPY N/A 04/18/2021   Procedure: FLEXIBLE SIGMOIDOSCOPY;  Surgeon: Iva Boop, MD;  Location: [facility] ENDOSCOPY;  Service: Endoscopy;  Laterality: N/A;   HERNIA REPAIR  1959   IR FLUORO GUIDE CV LINE RIGHT  03/18/2021   IR US GUIDE VASC ACCESS RIGHT  03/18/2021   LEFT HEART CATH AND CORONARY ANGIOGRAPHY N/A 04/04/2017   Procedure: Left Heart Cath and Coronary Angiography;  Surgeon: Lyn Records, MD;  Location: [facility] INVASIVE CV LAB;  Service: Cardiovascular;  Laterality: N/A;   MYOMECTOMY  1980, 2004, 2007   PERIPHERAL VASCULAR THROMBECTOMY  02/21/2022   Procedure: PERIPHERAL VASCULAR THROMBECTOMY;  Surgeon: Maeola Harman, MD;  Location: [facility] INVASIVE CV LAB;  Service: Cardiovascular;;  Right Tibial   RHINOPLASTY  1971   ROTATOR CUFF REPAIR  2003   SURGICAL REPAIR OF HEMORRHAGE  2015   TONSILLECTOMY  1968     Current Meds  Medication Sig   acetaminophen (TYLENOL) 500 MG tablet Take 1,000 mg by mouth 2 (two) times daily as needed (leg and arm pain).   albuterol (PROVENTIL) (2.5 MG/3ML) 0.083% nebulizer solution Use 1 vial (2.5 mg total) by nebulization every 4 (four) hours as needed for wheezing or shortness of breath.   aspirin EC 81 MG tablet Take 1 tablet (81 mg total) by mouth  daily. Swallow whole.   carvedilol (COREG) 3.125 MG tablet Take 1 tablet (3.125 mg total) by mouth 2 (two) times daily with a meal.   diclofenac Sodium (VOLTAREN) 1 % GEL Apply 2 g topically 4 (four) times daily. To left wrist   Glucagon, rDNA, (GLUCAGON EMERGENCY) 1 MG KIT Inject 1 mg into the vein as needed (CBG of 65mg /dL).   guaifenesin (ROBITUSSIN) 100 MG/5ML syrup Take 200 mg by mouth 3 (three) times daily as needed for cough.   hydrALAZINE (APRESOLINE) 25 MG tablet Take 1 tablet (25 mg total) by mouth in the morning and at bedtime.   insulin aspart (NOVOLOG) 100 UNIT/ML injection Inject 0-6 Units into the skin 3 (three) times daily with meals. CBG < 70: Implement Hypoglycemia Standing Orders and refer to Hypoglycemia Standing Orders sidebar report  CBG 70 - 120: 0 units CBG 121 - 150: 0 units CBG 151 - 200: 1 unit CBG 201-250: 2 units CBG 251-300: 3 units CBG 301-350: 4 units CBG 351-400: 5 units CBG > 400: Give 6 units and call MD   insulin glargine (LANTUS) 100 UNIT/ML Solostar Pen Inject 5 Units into the skin at bedtime.   levETIRAcetam (KEPPRA) 250 MG tablet Take 1.5 tablets (375 mg total) by mouth every Monday, Wednesday, and Friday at 6 PM.   levETIRAcetam (KEPPRA) 750 MG tablet Take 1 tablet (750 mg total) by mouth at bedtime.   loratadine (CLARITIN) 10 MG tablet Take 10 mg by mouth daily.   losartan (COZAAR) 50 MG tablet Take 50 mg by mouth daily.   methylphenidate (RITALIN) 5 MG tablet Take 5 mg by mouth daily.   mometasone-formoterol (DULERA) 200-5 MCG/ACT AERO Inhale 2 puffs into the lungs 2 (two) times daily.   pantoprazole (PROTONIX) 40 MG tablet Take 1 tablet (40 mg total) by mouth daily. (Patient taking differently: Take 40 mg by mouth at bedtime.)   prochlorperazine (COMPAZINE) 10 MG tablet Take 1 tablet (10 mg total) by mouth every 8 (eight) hours as needed for nausea or vomiting.   rosuvastatin (CRESTOR) 20 MG tablet Take 1 tablet (20 mg total) by mouth daily. (Patient  taking differently: Take 20 mg by mouth at bedtime.)   sevelamer carbonate (RENVELA) 0.8 g PACK packet Take 0.8 g by mouth 3 (three) times daily.   ticagrelor (BRILINTA) 90 MG TABS tablet Take 1 tablet (90 mg total) by mouth 2 (two) times daily.   vancomycin (VANCOCIN) 125 MG capsule Take 1 capsule (125 mg total) by mouth 4 (four) times daily.     Allergies:   Food, Statins, Gadolinium derivatives, Januvia [sitagliptin], Pork-derived products, Shellfish allergy, and Tetracycline   Social History   Tobacco Use   Smoking status: Never   Smokeless tobacco: Never  Vaping Use   Vaping Use: Never used  Substance Use Topics   Alcohol use: No   Drug use: No     Family Hx: The patient's family history includes Alzheimer's disease in her father; Cancer in her cousin and mother; Diabetes in her cousin and maternal aunt; Psoriasis in her mother. There is no history of Colon cancer, Colon polyps, Esophageal cancer, Cancer - Colon, or Liver disease.  ROS:   Please see the history of present illness.     All other systems reviewed and are negative.   Prior CV studies:   The following studies were reviewed today:  Cardiac Studies & Procedures   CARDIAC CATHETERIZATION  CARDIAC CATHETERIZATION 04/04/2017  Narrative  Heavily calcified eccentric 30 to 40% proximal LAD.  Widely patent circumflex, ramus intermedius, and native right coronary.  Normal left ventricular size and function with EF 60%. Elevated left ventricular end-diastolic pressure consistent with diastolic heart failure.  RECOMMENDATIONS:   Aggressive risk factor modification  Blood pressure control  Weight loss  Consider sleep study to rule out sleep apnea (based upon airway obstruction noted during the procedure after receiving low-dose sedation).  Findings Coronary Findings Diagnostic  Dominance: Co-dominant  Left Anterior Descending The lesion is eccentric. The lesion is calcified.  First Diagonal  Branch Vessel is small in size.  Second Diagonal Branch Vessel is small in size.  Third Diagonal Branch Vessel is small in size.  Left Circumflex  First Obtuse Marginal Branch Vessel is small in size.  Second Obtuse Marginal Branch Vessel is small in size.  Third Obtuse  Marginal Branch Vessel is small in size.  Intervention  No interventions have been documented.   STRESS TESTS  MYOCARDIAL PERFUSION IMAGING 03/21/2017  Narrative  The left ventricular ejection fraction is normal (55-65%).  Nuclear stress EF: 65%.  No T wave inversion was noted during stress.  There was no ST segment deviation noted during stress.  Defect 1: There is a medium defect of moderate severity.  Findings consistent with ischemia.  This is an intermediate risk study.  Medium size, moderate intensity reversible (SDS 8) anterior, anteroseptal and anterolateral perfusion defect suggestive of ischemia or less likely shifting breast attenuation artifact. LVEF 65% with normal wall motion. This is an intermediate risk study.   ECHOCARDIOGRAM  ECHOCARDIOGRAM COMPLETE 06/04/2022  Narrative ECHOCARDIOGRAM REPORT    Patient Name:   ALEIGHYA MCANELLY [facility] Date of Exam: 06/04/2022 Medical Rec #:  [id number]      Height:       65.0 in Accession #:    [id number]     Weight:       173.9 lb Date of Birth:  [birth year]      BSA:          1.864 m Patient Age:    71 years       BP:           150/62 mmHg Patient Gender: F              HR:           72 bpm. Exam Location:  Inpatient  Procedure: 2D Echo  Indications:     stroke  History:         Patient has prior history of Echocardiogram examinations, most recent 03/11/2021. CAD, end stage renal disease, Signs/Symptoms:Shortness of Breath; Risk Factors:Hypertension, Dyslipidemia and Diabetes.  Sonographer:     Delcie Roch RDCS Referring Phys:  [id number] A CALDWELL POWELL JR Diagnosing Phys: Lennie Odor MD   Sonographer Comments: Image acquisition  challenging due to uncooperative patient. IMPRESSIONS   1. Left ventricular ejection fraction, by estimation, is 60 to 65%. The left ventricle has normal function. The left ventricle has no regional wall motion abnormalities. Left ventricular diastolic parameters are consistent with Grade I diastolic dysfunction (impaired relaxation). 2. Right ventricular systolic function is normal. The right ventricular size is normal. There is mildly elevated pulmonary artery systolic pressure. The estimated right ventricular systolic pressure is 39.4 mmHg. 3. The mitral valve is grossly normal. No evidence of mitral valve regurgitation. No evidence of mitral stenosis. 4. The aortic valve is tricuspid. There is mild calcification of the aortic valve. There is mild thickening of the aortic valve. Aortic valve regurgitation is not visualized. Aortic valve sclerosis is present, with no evidence of aortic valve stenosis. 5. The inferior vena cava is dilated in size with >50% respiratory variability, suggesting right atrial pressure of 8 mmHg.  Conclusion(s)/Recommendation(s): No intracardiac source of embolism detected on this transthoracic study. Consider a transesophageal echocardiogram to exclude cardiac source of embolism if clinically indicated.  FINDINGS Left Ventricle: Left ventricular ejection fraction, by estimation, is 60 to 65%. The left ventricle has normal function. The left ventricle has no regional wall motion abnormalities. The left ventricular internal cavity size was normal in size. There is no left ventricular hypertrophy. Left ventricular diastolic parameters are consistent with Grade I diastolic dysfunction (impaired relaxation).  Right Ventricle: The right ventricular size is normal. No increase in right ventricular wall thickness. Right ventricular systolic function is normal. There is mildly elevated pulmonary  artery systolic pressure. The tricuspid regurgitant velocity is 2.80 m/s, and with  an assumed right atrial pressure of 8 mmHg, the estimated right ventricular systolic pressure is 39.4 mmHg.  Left Atrium: Left atrial size was normal in size.  Right Atrium: Right atrial size was normal in size.  Pericardium: Trivial pericardial effusion is present.  Mitral Valve: The mitral valve is grossly normal. No evidence of mitral valve regurgitation. No evidence of mitral valve stenosis.  Tricuspid Valve: The tricuspid valve is grossly normal. Tricuspid valve regurgitation is trivial. No evidence of tricuspid stenosis.  Aortic Valve: The aortic valve is tricuspid. There is mild calcification of the aortic valve. There is mild thickening of the aortic valve. Aortic valve regurgitation is not visualized. Aortic valve sclerosis is present, with no evidence of aortic valve stenosis.  Pulmonic Valve: The pulmonic valve was grossly normal. Pulmonic valve regurgitation is not visualized. No evidence of pulmonic stenosis.  Aorta: The aortic root and ascending aorta are structurally normal, with no evidence of dilitation.  Venous: The inferior vena cava is dilated in size with greater than 50% respiratory variability, suggesting right atrial pressure of 8 mmHg.  IAS/Shunts: The atrial septum is grossly normal.   LEFT VENTRICLE PLAX 2D LVIDd:         4.00 cm   Diastology LVIDs:         2.30 cm   LV e' medial:    11.30 cm/s LV PW:         0.90 cm   LV E/e' medial:  8.4 LV IVS:        1.00 cm   LV e' lateral:   9.68 cm/s LVOT diam:     1.90 cm   LV E/e' lateral: 9.8 LV SV:         65 LV SV Index:   35 LVOT Area:     2.84 cm   RIGHT VENTRICLE             IVC RV S prime:     12.80 cm/s  IVC diam: 2.10 cm  LEFT ATRIUM             Index        RIGHT ATRIUM           Index LA diam:        3.70 cm 1.98 cm/m   RA Area:     15.20 cm LA Vol (A2C):   28.3 ml 15.18 ml/m  RA Volume:   39.40 ml  21.14 ml/m LA Vol (A4C):   37.3 ml 20.01 ml/m LA Biplane Vol: 34.2 ml 18.35 ml/m AORTIC  VALVE LVOT Vmax:   117.00 cm/s LVOT Vmean:  77.800 cm/s LVOT VTI:    0.229 m  AORTA Ao Root diam: 2.70 cm Ao Asc diam:  2.90 cm  MITRAL VALVE                TRICUSPID VALVE MV Area (PHT): 3.60 cm     TR Peak grad:   31.4 mmHg MV Decel Time: 211 msec     TR Vmax:        280.00 cm/s MV E velocity: 94.70 cm/s MV A velocity: 122.00 cm/s  SHUNTS MV E/A ratio:  0.78         Systemic VTI:  0.23 m Systemic Diam: 1.90 cm  Lennie Odor MD Electronically signed by Lennie Odor MD Signature Date/Time: 06/04/2022/4:23:53 PM    Final (Updated)  Labs/Other Tests and Data Reviewed:    EKG:  No ECG reviewed.  Cardiac Studies & Procedures   CARDIAC CATHETERIZATION  CARDIAC CATHETERIZATION 04/04/2017  Narrative  Heavily calcified eccentric 30 to 40% proximal LAD.  Widely patent circumflex, ramus intermedius, and native right coronary.  Normal left ventricular size and function with EF 60%. Elevated left ventricular end-diastolic pressure consistent with diastolic heart failure.  RECOMMENDATIONS:   Aggressive risk factor modification  Blood pressure control  Weight loss  Consider sleep study to rule out sleep apnea (based upon airway obstruction noted during the procedure after receiving low-dose sedation).  Findings Coronary Findings Diagnostic  Dominance: Co-dominant  Left Anterior Descending The lesion is eccentric. The lesion is calcified.  First Diagonal Branch Vessel is small in size.  Second Diagonal Branch Vessel is small in size.  Third Diagonal Branch Vessel is small in size.  Left Circumflex  First Obtuse Marginal Branch Vessel is small in size.  Second Obtuse Marginal Branch Vessel is small in size.  Third Obtuse Marginal Branch Vessel is small in size.  Intervention  No interventions have been documented.   STRESS TESTS  MYOCARDIAL PERFUSION IMAGING 03/21/2017  Narrative  The left ventricular ejection fraction is normal  (55-65%).  Nuclear stress EF: 65%.  No T wave inversion was noted during stress.  There was no ST segment deviation noted during stress.  Defect 1: There is a medium defect of moderate severity.  Findings consistent with ischemia.  This is an intermediate risk study.  Medium size, moderate intensity reversible (SDS 8) anterior, anteroseptal and anterolateral perfusion defect suggestive of ischemia or less likely shifting breast attenuation artifact. LVEF 65% with normal wall motion. This is an intermediate risk study.   ECHOCARDIOGRAM  ECHOCARDIOGRAM COMPLETE 06/04/2022  Narrative ECHOCARDIOGRAM REPORT    Patient Name:   WANETA FITTING [facility] Date of Exam: 06/04/2022 Medical Rec #:  [id number]      Height:       65.0 in Accession #:    [id number]     Weight:       173.9 lb Date of Birth:  10-12-1951      BSA:          1.864 m Patient Age:    71 years       BP:           150/62 mmHg Patient Gender: F              HR:           72 bpm. Exam Location:  Inpatient  Procedure: 2D Echo  Indications:     stroke  History:         Patient has prior history of Echocardiogram examinations, most recent 03/11/2021. CAD, end stage renal disease, Signs/Symptoms:Shortness of Breath; Risk Factors:Hypertension, Dyslipidemia and Diabetes.  Sonographer:     Delcie Roch RDCS Referring Phys:  [id number] A CALDWELL POWELL JR Diagnosing Phys: Lennie Odor MD   Sonographer Comments: Image acquisition challenging due to uncooperative patient. IMPRESSIONS   1. Left ventricular ejection fraction, by estimation, is 60 to 65%. The left ventricle has normal function. The left ventricle has no regional wall motion abnormalities. Left ventricular diastolic parameters are consistent with Grade I diastolic dysfunction (impaired relaxation). 2. Right ventricular systolic function is normal. The right ventricular size is normal. There is mildly elevated pulmonary artery systolic pressure. The estimated right  ventricular systolic pressure is 39.4 mmHg. 3. The mitral valve is grossly normal. No evidence of mitral  valve regurgitation. No evidence of mitral stenosis. 4. The aortic valve is tricuspid. There is mild calcification of the aortic valve. There is mild thickening of the aortic valve. Aortic valve regurgitation is not visualized. Aortic valve sclerosis is present, with no evidence of aortic valve stenosis. 5. The inferior vena cava is dilated in size with >50% respiratory variability, suggesting right atrial pressure of 8 mmHg.  Conclusion(s)/Recommendation(s): No intracardiac source of embolism detected on this transthoracic study. Consider a transesophageal echocardiogram to exclude cardiac source of embolism if clinically indicated.  FINDINGS Left Ventricle: Left ventricular ejection fraction, by estimation, is 60 to 65%. The left ventricle has normal function. The left ventricle has no regional wall motion abnormalities. The left ventricular internal cavity size was normal in size. There is no left ventricular hypertrophy. Left ventricular diastolic parameters are consistent with Grade I diastolic dysfunction (impaired relaxation).  Right Ventricle: The right ventricular size is normal. No increase in right ventricular wall thickness. Right ventricular systolic function is normal. There is mildly elevated pulmonary artery systolic pressure. The tricuspid regurgitant velocity is 2.80 m/s, and with an assumed right atrial pressure of 8 mmHg, the estimated right ventricular systolic pressure is 39.4 mmHg.  Left Atrium: Left atrial size was normal in size.  Right Atrium: Right atrial size was normal in size.  Pericardium: Trivial pericardial effusion is present.  Mitral Valve: The mitral valve is grossly normal. No evidence of mitral valve regurgitation. No evidence of mitral valve stenosis.  Tricuspid Valve: The tricuspid valve is grossly normal. Tricuspid valve regurgitation is trivial. No  evidence of tricuspid stenosis.  Aortic Valve: The aortic valve is tricuspid. There is mild calcification of the aortic valve. There is mild thickening of the aortic valve. Aortic valve regurgitation is not visualized. Aortic valve sclerosis is present, with no evidence of aortic valve stenosis.  Pulmonic Valve: The pulmonic valve was grossly normal. Pulmonic valve regurgitation is not visualized. No evidence of pulmonic stenosis.  Aorta: The aortic root and ascending aorta are structurally normal, with no evidence of dilitation.  Venous: The inferior vena cava is dilated in size with greater than 50% respiratory variability, suggesting right atrial pressure of 8 mmHg.  IAS/Shunts: The atrial septum is grossly normal.   LEFT VENTRICLE PLAX 2D LVIDd:         4.00 cm   Diastology LVIDs:         2.30 cm   LV e' medial:    11.30 cm/s LV PW:         0.90 cm   LV E/e' medial:  8.4 LV IVS:        1.00 cm   LV e' lateral:   9.68 cm/s LVOT diam:     1.90 cm   LV E/e' lateral: 9.8 LV SV:         65 LV SV Index:   35 LVOT Area:     2.84 cm   RIGHT VENTRICLE             IVC RV S prime:     12.80 cm/s  IVC diam: 2.10 cm  LEFT ATRIUM             Index        RIGHT ATRIUM           Index LA diam:        3.70 cm 1.98 cm/m   RA Area:     15.20 cm LA Vol (A2C):   28.3 ml 15.18  ml/m  RA Volume:   39.40 ml  21.14 ml/m LA Vol (A4C):   37.3 ml 20.01 ml/m LA Biplane Vol: 34.2 ml 18.35 ml/m AORTIC VALVE LVOT Vmax:   117.00 cm/s LVOT Vmean:  77.800 cm/s LVOT VTI:    0.229 m  AORTA Ao Root diam: 2.70 cm Ao Asc diam:  2.90 cm  MITRAL VALVE                TRICUSPID VALVE MV Area (PHT): 3.60 cm     TR Peak grad:   31.4 mmHg MV Decel Time: 211 msec     TR Vmax:        280.00 cm/s MV E velocity: 94.70 cm/s MV A velocity: 122.00 cm/s  SHUNTS MV E/A ratio:  0.78         Systemic VTI:  0.23 m Systemic Diam: 1.90 cm  Lennie Odor MD Electronically signed by Lennie Odor MD Signature  Date/Time: 06/04/2022/4:23:53 PM    Final (Updated)              Recent Labs: 06/02/2022: TSH 2.095 06/10/2022: Magnesium 1.9 06/22/2022: ALT 21 02/09/2023: B Natriuretic Peptide 331.1 02/14/2023: BUN 29; Creatinine, Ser 5.30; Hemoglobin 9.8; Platelets 420; Potassium 3.8; Sodium 136   Recent Lipid Panel Lab Results  Component Value Date/Time   CHOL 76 06/05/2022 01:12 AM   CHOL 96 (L) 04/13/2022 03:26 PM   TRIG 43 06/05/2022 01:12 AM   HDL 43 06/05/2022 01:12 AM   HDL 34 (L) 04/13/2022 03:26 PM   CHOLHDL 1.8 06/05/2022 01:12 AM   LDLCALC 24 06/05/2022 01:12 AM   LDLCALC 39 04/13/2022 03:26 PM    Wt Readings from Last 3 Encounters:  02/15/23 180 lb (81.6 kg)  02/14/23 181 lb 14.1 oz (82.5 kg)  01/10/23 182 lb (82.6 kg)          Objective:    Vital Signs:  BP (!) 143/72   Pulse 70   Ht 5\' 3"  (1.6 m)   BMI 31.89 kg/m    VITAL SIGNS:  reviewed  ASSESSMENT & PLAN:    Hypertension-BP at office visit yesterday with gastroenterology 130/68.  BP at home today 143/72. Discussed to monitor BP at home at least 2 hours after medications and sitting for 5-10 minutes.  Continue present antihypertensive regimen hydralazine 25 mg twice daily, losartan 50 mg daily, carvedilol 3.125 mg twice daily.  If BP persistently elevated consistently greater than 130/80 at home she will contact her office and plan to increase hydralazine at that time.  ESRD-on HD MWF.  Follows nephrology.  Diastolic heart failure-volume per HD.  Continue carvedilol, hydralazine. Low sodium diet, fluid restriction <2L, and daily weights encouraged.   History of CVA-continue aspirin, Brilinta, rosuvastatin.  Continue optimal blood pressure and blood sugar control.  IDDM - Continue to follow with PCP.   CAD/PAD/hyperlipidemia-s/p right BKA.  Follows with [facility].  Continue aspirin, statin, Brilinta, carvedilol       Time:   Today, I have spent 8 minutes with the patient with telehealth technology discussing the  above problems.     Medication Adjustments/Labs and Tests Ordered: Current medicines are reviewed at length with the patient today.  Concerns regarding medicines are outlined above.   Tests Ordered: No orders of the defined types were placed in this encounter.   Medication Changes: No orders of the defined types were placed in this encounter.   Follow Up:  In Person in 6 month(s)  Signed, Alver Sorrow, NP  02/16/2023 2:54 PM    [facility]

## 2023-02-21 ENCOUNTER — Encounter: Payer: Medicare PPO | Admitting: Physical Therapy

## 2023-02-23 ENCOUNTER — Telehealth: Payer: Self-pay | Admitting: Orthopedic Surgery

## 2023-02-23 ENCOUNTER — Encounter: Payer: Medicare PPO | Admitting: Physical Therapy

## 2023-02-23 DIAGNOSIS — S88111A Complete traumatic amputation at level between knee and ankle, right lower leg, initial encounter: Secondary | ICD-10-CM

## 2023-02-23 NOTE — Telephone Encounter (Signed)
Patient has been in the hospital. Is now ready to resume outpatient PT with [facility].  Will need a new referral in Epic for PT with [facility]. Patient;s husband called. Today is the last day of in home PT.

## 2023-02-24 NOTE — Addendum Note (Signed)
Addended by: Barnie Del R on: 02/24/2023 11:23 AM   Modules accepted: Orders

## 2023-03-06 ENCOUNTER — Encounter: Payer: Self-pay | Admitting: Physical Therapy

## 2023-03-06 ENCOUNTER — Ambulatory Visit: Payer: Medicare PPO | Admitting: Physical Therapy

## 2023-03-06 DIAGNOSIS — M25562 Pain in left knee: Secondary | ICD-10-CM

## 2023-03-06 DIAGNOSIS — M6281 Muscle weakness (generalized): Secondary | ICD-10-CM

## 2023-03-06 DIAGNOSIS — L98498 Non-pressure chronic ulcer of skin of other sites with other specified severity: Secondary | ICD-10-CM

## 2023-03-06 DIAGNOSIS — I69354 Hemiplegia and hemiparesis following cerebral infarction affecting left non-dominant side: Secondary | ICD-10-CM

## 2023-03-06 DIAGNOSIS — M25561 Pain in right knee: Secondary | ICD-10-CM

## 2023-03-06 DIAGNOSIS — R2689 Other abnormalities of gait and mobility: Secondary | ICD-10-CM

## 2023-03-06 DIAGNOSIS — R2681 Unsteadiness on feet: Secondary | ICD-10-CM | POA: Diagnosis not present

## 2023-03-06 DIAGNOSIS — G8929 Other chronic pain: Secondary | ICD-10-CM

## 2023-03-06 NOTE — Therapy (Signed)
OUTPATIENT PHYSICAL THERAPY TREATMENT, REASSESSMENT & PROGRESS NOTE   Patient Name: Carolyn Russo MRN: [id number] DOB:11-Oct-1951, 72 y.o., female Today's Date: 03/06/2023  PCP: No PCP on file REFERRING PROVIDER: Aldean Baker, MD  Progress Note Reporting Period 01/05/2023 to 03/06/2023  See note below for Objective Data and Assessment of Progress/Goals.      END OF SESSION:  PT End of Session - 03/06/23 1523     Visit Number 10    Number of Visits 36    Date for PT Re-Evaluation 06/01/23    Authorization Type [employer] PPO    Authorization Time Period $20 co-pay    Progress Note Due on Visit 20    PT Start Time 1520    Equipment Utilized During Treatment Gait belt    Activity Tolerance Patient tolerated treatment well;Patient limited by fatigue;Patient limited by pain    Behavior During Therapy WFL for tasks assessed/performed                Past Medical History:  Diagnosis Date   Acute GI bleeding    Allergy    Anemia    Anterior chest wall pain    Appendicitis 1965   Asthma    Body mass index 37.0-37.9, adult    Breast pain    Cataract    both eyes   CHF (congestive heart failure) (HCC)    Chronic kidney disease    stage 5 - on dialysis   Cognitive change 04/20/2021   r/t cva 03/2821   Complication of anesthesia    Memory loss after general   Dehydration 2014   Deviated septum 1971   Diabetes mellitus    Dysphagia due to old stroke    easy to get strangled when eating   Dyspnea 2014   Extrinsic asthma    WITH ASTHMA ATTACK   Fibroid 1980   GERD (gastroesophageal reflux disease)    Heart murmur    History of migraine    History of seizure    with stroke   Hx gestational diabetes    Hyperlipidemia    Hypertension 2014   Inguinal hernia 1959   Malaise and fatigue 2014   Non-IgE mediated allergic asthma 2014   Obesity    Pelvic pain    Pregnancy, high-risk 1985   Stroke (HCC) 04/20/2021   (CVA) of right basal ganglia    Tonsillitis 1968   Uterine fibroid 1980   Visual field defect    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   Past Surgical History:  Procedure Laterality Date   ABDOMINAL AORTOGRAM W/LOWER EXTREMITY N/A 02/21/2022   Procedure: ABDOMINAL AORTOGRAM W/LOWER EXTREMITY;  Surgeon: Maeola Harman, MD;  Location: [facility] INVASIVE CV LAB;  Service: Cardiovascular;  Laterality: N/A;   AMPUTATION Right 05/18/2022   Procedure: RIGHT BELOW KNEE AMPUTATION;  Surgeon: Nadara Mustard, MD;  Location: [facility] OR;  Service: Orthopedics;  Laterality: Right;   AMPUTATION TOE Right 04/15/2022   Procedure: AMPUTATION  LST TOERIGHT FOOT;  Surgeon: Edwin Cap, DPM;  Location: [facility] ORS;  Service: Podiatry;  Laterality: Right;   APPENDECTOMY  1959   BASCILIC VEIN TRANSPOSITION Right 04/30/2021   Procedure: RIGHT FIRST STAGE BASCILIC VEIN TRANSPOSITION;  Surgeon: Chuck Hint, MD;  Location: [facility] OR;  Service: Vascular;  Laterality: Right;   CESAREAN SECTION  1985   COLONOSCOPY     ESOPHAGOGASTRODUODENOSCOPY (EGD) WITH PROPOFOL N/A 04/18/2021   Procedure: ESOPHAGOGASTRODUODENOSCOPY (EGD) WITH PROPOFOL;  Surgeon: Iva Boop, MD;  Location: [facility] ENDOSCOPY;  Service: Endoscopy;  Laterality: N/A;   EYE SURGERY     bilateral cataract    FLEXIBLE SIGMOIDOSCOPY N/A 04/18/2021   Procedure: FLEXIBLE SIGMOIDOSCOPY;  Surgeon: Iva Boop, MD;  Location: [facility] ENDOSCOPY;  Service: Endoscopy;  Laterality: N/A;   HERNIA REPAIR  1959   IR FLUORO GUIDE CV LINE RIGHT  03/18/2021   IR US GUIDE VASC ACCESS RIGHT  03/18/2021   LEFT HEART CATH AND CORONARY ANGIOGRAPHY N/A 04/04/2017   Procedure: Left Heart Cath and Coronary Angiography;  Surgeon: Lyn Records, MD;  Location: [facility] INVASIVE CV LAB;  Service: Cardiovascular;  Laterality: N/A;   MYOMECTOMY  1980, 2004, 2007   PERIPHERAL VASCULAR THROMBECTOMY  02/21/2022   Procedure: PERIPHERAL VASCULAR THROMBECTOMY;  Surgeon: Maeola Harman, MD;  Location: [facility] INVASIVE CV LAB;  Service:  Cardiovascular;;  Right Tibial   RHINOPLASTY  1971   ROTATOR CUFF REPAIR  2003   SURGICAL REPAIR OF HEMORRHAGE  2015   TONSILLECTOMY  1968   Patient Active Problem List   Diagnosis Date Noted   CHF (congestive heart failure) (HCC) 02/10/2023   Dyspnea 02/09/2023   Acute respiratory distress 02/09/2023   Urinary tract infection without hematuria    Diabetes mellitus due to underlying condition with hypoglycemia without coma, with long-term current use of insulin (HCC)    Seizure disorder (HCC) 06/22/2022   Acute respiratory failure (HCC)    Unresponsiveness    Pressure ulcer 06/07/2022   Acute stroke due to ischemia (HCC) 06/03/2022   Chest pain 06/01/2022   History of CVA (cerebrovascular accident) 06/01/2022   Seizure (HCC)    Encephalopathy    Below-knee amputation of right lower extremity (HCC)    Leukocytosis    Foot infection 05/10/2022   PAD (peripheral artery disease) (HCC) 05/10/2022   Gangrene of right foot (HCC) 04/15/2022   Left hemiparesis (HCC) 09/24/2021   Abnormality of gait 09/24/2021   Pain of right hand 08/06/2021   Steal syndrome of dialysis vascular access (HCC) 08/06/2021   Subclavian steal syndrome of right subclavian artery 06/25/2021   Right hand weakness 06/25/2021   Stroke-like symptoms 05/06/2021   Bandemia    Cerebrovascular accident (CVA) of right basal ganglia (HCC) 04/20/2021   Hemorrhoids    Basal ganglia infarction (HCC) 04/16/2021   ESRD on dialysis (HCC) 04/16/2021   Hypertensive urgency 03/10/2021   Hilar enlargement    Anemia of chronic disease    Chronic diastolic CHF (congestive heart failure) (HCC) 03/09/2021   CKD (chronic kidney disease), stage V (HCC) 03/08/2021   Diabetic retinopathy (HCC) 03/08/2021   Hyperglycemia due to type 2 diabetes mellitus (HCC) 03/08/2021   Pure hypercholesterolemia 03/08/2021   Anemia in chronic kidney disease 09/24/2020   Uncontrolled type 2 diabetes mellitus with hyperglycemia (HCC) 03/19/2019    Osteopenia 09/04/2018   Migraine 09/04/2018   Asthma 09/04/2018   Pseudophakia of both eyes 07/17/2018   Pseudophakia of left eye 07/03/2018   Open angle with borderline findings and high glaucoma risk in left eye 07/03/2018   Age-related nuclear cataract of right eye 07/03/2018   CAD (coronary artery disease), native coronary artery 04/27/2017   Abnormal nuclear stress test 04/04/2017   Shortness of breath 03/09/2017   Appendicitis    Age-related hypermature cataract of both eyes 12/20/2016   Uterine leiomyoma 11/27/2012   Dyslipidemia (high LDL; low HDL) 11/25/2011   Obesity (BMI 30-39.9) 11/25/2011   Hypertensive disorder 11/21/2011   Type 2 diabetes mellitus (HCC) 11/21/2011  Abnormal EKG 11/21/2011    ONSET DATE: 12/29/2022 prosthesis delivery  REFERRING DIAG: Z61.096E (ICD-10-CM) - Below-knee amputation of right lower extremity   THERAPY DIAG:  Unsteadiness on feet  Other abnormalities of gait and mobility  Muscle weakness (generalized)  Non-pressure chronic ulcer of skin of other sites with other specified severity (HCC)  Hemiplegia and hemiparesis following cerebral infarction affecting left non-dominant side (HCC)  Chronic pain of both knees  Rationale for Evaluation and Treatment: Rehabilitation  SUBJECTIVE:   SUBJECTIVE STATEMENT: Patient was hospitalized 02/09/2023 - 02/14/2023 with pneumonia.  She had HHPT & OT thru 02/25/23. She has appt with prosthetist tomorrow.  She is wearing prosthesis daily 5 hrs 2x/day but not to dialysis yet.   PERTINENT HISTORY: CVA / left hemiparesis due to right basal ganglia infarct 7/22, right hip bursitis, anxiety, dCHF, ESRD on HD, HTN, DM, CAD  PAIN:  Are you having pain?  Yes: NPRS scale:0/10 sitting & standing 5-6/10 (less frequent & not as loud c/o pain in PT today) Pain location: bilateral knees more patella Pain description: throbbing Aggravating factors: unknown Relieving factors: Voltarin & massage  PRECAUTIONS:  Fall and Other: NO BP RUE  WEIGHT BEARING RESTRICTIONS: No  FALLS: Has patient fallen in last 6 months? No  LIVING ENVIRONMENT: Lives with: lives with their spouse and CNAs 24 hrs 7 days / wk Lives in: House Home Access: Ramped entrance Home layout: Multi level, Full bath on main level, and Able to live on main level with bedroom and bathroom Stairs: Yes: Internal: 16 steps; on left going up and External: 4 steps; on right going up Has following equipment at home: Single point cane, Walker - 2 wheeled, Wheelchair (manual), and shower chair  PLOF: Independent with household mobility with device and Independent with community mobility with device  PATIENT GOALS: walk with prosthesis, stand & transfer.   OBJECTIVE:  COGNITION: Overall cognitive status: History of cognitive impairments - at baseline  POSTURE: rounded shoulders, forward head, decreased lumbar lordosis, increased thoracic kyphosis, and flexed trunk   LOWER EXTREMITY ROM: Eval / 01/05/2023:    BLEs PROM WFL except unable to assess hip ext  (Blank rows = not tested)  LOWER EXTREMITY MMT:  MMT Right eval Right 03/06/23  Hip flexion 3-/5 3-/5  Hip extension 3-/5 3-/5  Hip abduction 3-/5 3-/5  Hip adduction    Hip internal rotation    Hip external rotation    Knee flexion 3-/5 3-/5  Knee extension 3-/5 3-/5  Ankle dorsiflexion NA NA  Ankle plantarflexion NA NA  Ankle inversion NA NA  Ankle eversion NA NA  03/06/2023: LLE hemiparesis but able to advance during gait without assistance.   Eval / 01/05/2023: left LE & UE hemiparesis.  In gait slides to advance LLE and knee / hip instability in stance.  (Blank rows = not tested)  TRANSFERS: 03/06/2023:  Sit to stand w/c & 18" chair with armrests to RW with modA. Stand to sit with minA. Stand-pivot  transfer with RW with modA / constant cueing.  02/07/2023: Sit to stand w/c or 18" chair with armrests to RW with modA. Stand to sit with minA. Both require constant  cues. Stand-pivot transfer with RW with modA / constant cueing. (2nd person for safety)   01/05/2023 / Eval: Sit to stand: Max A pulling BUEs on //bars from w/c Stand to sit: Min A using BUEs //bars to control descent Lateral scoot transfers: Mod A w/c to / from mat table level & adjacent  GAIT: 03/06/2023: Pt amb 5' with RW with +2 modA including 90* turn to chair.  02/07/2023: Pt amb 5' with RW with +2 modA including 90* turn to chair.   01/05/2023 / Eval: Gait pattern: step to pattern, decreased step length- Right, decreased step length- Left, decreased stance time- Right, decreased stance time- Left, decreased hip/knee flexion- Left, decreased ankle dorsiflexion- Left, Right hip hike, Left hip hike, knee flexed in stance- Left, antalgic, lateral hip instability, trunk flexed, wide BOS, and poor foot clearance- Left Distance walked: 3' Assistive device utilized: // bars & RLE TTA prosthesis Level of assistance: Total A / 2 person Comments:  LLE with poor clearance / slides foot forward and knee / hip instability in stance.   FUNCTIONAL TESTs:  03/06/2023: static stance with RW support 60 sec with minA.  Eval / 01/05/2023:  Standing in //bars for 30 sec with modA.    CURRENT PROSTHETIC WEAR ASSESSMENT: 03/06/2023: Patient's husband & CNA correctly don & doff her prosthesis. Pt is dependent requiring maxA to don or doff.  She reports wearing prosthesis 5 hrs 2x/day for 10 hours total on non-dialysis days and up to 8 hours on dialysis days.  Her limb has scab on lateral incision that appears to be internal suture working way out. No signs of infection. Her residual limb appearance is dry skin, normal temperature, limited to no hair growth, cylindrical shape.   Eval / 01/05/2023:  Patient is dependent with: skin check, residual limb care, care of non-amputated limb, prosthetic cleaning, ply sock cleaning, correct ply sock adjustment, proper wear schedule/adjustment, and proper  weight-bearing schedule/adjustment Donning prosthesis: Total A / family & CNA requires 100% cueing Doffing prosthesis: Total A / family & CNA requires 100% cueing Prosthetic wear tolerance: 1-1.5 hours, 1x/day, 6 of 7 days since delivery Prosthetic weight bearing tolerance: 3 minutes during eval with c/o knee pain Edema: pitting Residual limb condition: lateral incision with 3mm X 6mm long scabs with serous drainage, arrived with bandaid over wound,  dry skin, normal temperature & color, cylindrical shape Prosthetic description: silicon liner with pin lock suspension, total contact socket, SACH foot K code/activity level with prosthetic use: Level 1    TODAY'S TREATMENT:                                                                                                                             DATE:  03/06/2023: Prosthetic Training with TTA prosthesis: See objective data for reassessment info.   PT recommended increasing wear to all awake hours except 1-2 hours midday.  Begin to wear to dialysis in 2 days.  Pt & husband verbalize understanding.  Pt reports that she takes sponge bath 2x/day.  PT recommended washing & drying area under liner first, then donning prosthesis prior to bathing remaining areas of body to enable prosthesis to assist with sitting balance & limited standing with RW to wash buttocks. Pt & husband verbalized understanding.    02/07/2023: Prosthetic  Training with TTA prosthesis Sit to stand with RW and patient pushing off wheelchair with mod assist, requires cues for set up, hand placement, and sequence, X 6 reps total Stand pivot transfers from Wheelchair to standard chair with arms placed 90 deg using RW with modA (2nd person for safety).  PT demo & verbal cues for technique prior & during.  Patient ambulated 5 feet X 2 with rolling walker walking to 2nd chair 90* turn to position to sit down (1st to right & 2nd to left) with mod assist +2 person. PT demo & verbal cues on  donning including managing distal 3-4" soft tissue beyond tibia. Pt's husband verbalized understanding.  PT recommended increasing wear to 5 hours 2x/day (only 1x on dialysis days).  If no issues today to Sunday (5 days) then wear prosthesis to dialysis Monday. Don prior to leaving house and doff upon return for 2-3 hours. Wear 2nd time after break / nap. 2kg weight of prosthesis needs to be subtracted from weigh-in & weigh-out of dialysis. Pt & husband verbalize understanding.    02/02/2023: Prosthetic Training with TTA prosthesis Sit to stand with RW and patient pushing off wheelchair with mod assist, requires cues for set up, hand placement, and sequence, X 5 reps total Patient ambulated 5 feet X 2 with rolling walker with max assist (second person for safety (husband) and one extra person for chair follow (CNA)). Stand pivot transfers from Wheelchair to standard chair with arms placed 90 deg to simulate toilet transfer. Transferred to chair and back with mod A with max cues for sequence for foot placement, where to push up from, RW management, and to shift weight fully onto one leg to lift and turn on other leg, then to take steps back until she touches the chair with the back of her legs before sitting.    HOME EXERCISE PROGRAM:  ASSESSMENT:  CLINICAL IMPRESSION: Patient was hospitalized with pneumonia and has been out of outpatient PT for over a month.  Patient appears to be functioning at similar level to the last time this PT saw her a month ago.  She is still dependent in donning or doffing the prosthesis herself but family and caregivers are able to properly don the prosthesis.  She has progressed to wear the prosthesis to 5 hours 2 times a day which is approximately 60-75% of awake hours.  She has a potential to utilize the prosthesis all of her awake hours which improves her safety and function.  Patient is dependent in sit to stand & stand pivot transfers.  He is a remarkably improved  from initial evaluation but needs further functional training.  Patient is currently able to ambulate up to 5 feet with rolling walker with two-person mod assist.  Again she needs further skilled physical therapy to improve her ambulatory skills.  Patient has potential to improve safety and function with the prosthesis including decreasing burden of care on family with further skilled PT.  OBJECTIVE IMPAIRMENTS: Abnormal gait, decreased activity tolerance, decreased balance, decreased cognition, decreased coordination, decreased endurance, decreased knowledge of condition, decreased knowledge of use of DME, decreased mobility, difficulty walking, decreased ROM, decreased strength, postural dysfunction, prosthetic dependency , obesity, and pain.   ACTIVITY LIMITATIONS: standing, stairs, transfers, locomotion level, and safe prosthesis use.  PARTICIPATION LIMITATIONS: community activity, church, and household mobility  PERSONAL FACTORS: Age, Fitness, Past/current experiences, Time since onset of injury/illness/exacerbation, and 3+ comorbidities: see PMH  are also affecting patient's functional outcome.   REHAB POTENTIAL:  Good  CLINICAL DECISION MAKING: Evolving/moderate complexity  EVALUATION COMPLEXITY: Moderate   GOALS: Goals reviewed with patient? Yes  SHORT TERM GOALS: Target date: 04/06/2023  Patient reports how to correctly adjust dialysis weight and and weigh out to include prosthesis. Baseline: SEE OBJECTIVE DATA Goal status: Set 03/06/2023 2.  Patient tolerates prosthesis >90% of awake hrs/day without increase in skin issues or limb / knee pain <5/10 with standing. Baseline: SEE OBJECTIVE DATA Goal status:  Set 03/06/2023  3.  Patient stand pivot transfer with RW with minA. Baseline: SEE OBJECTIVE DATA Goal status: Set 03/06/2023  4. Patient ambulates 10' including turning 90* to position to sit with RW with modA of one person.  Baseline: SEE OBJECTIVE DATA Goal status: Set  03/06/2023    LONG TERM GOALS: Target date: 06/01/2023  Patient & family / CNA demonstrates & verbalized understanding of prosthetic care to enable safe utilization of prosthesis. Baseline: SEE OBJECTIVE DATA Goal status: ongoing 03/06/2023  Patient tolerates prosthesis wear >90% of awake hours without skin or limb pain issues and knee pain </= 4/10.  Baseline: SEE OBJECTIVE DATA Goal status:  ongoing 03/06/2023  Stand balance with walker: static 2 min with supervision to enable ability for standing ADLS. Baseline: SEE OBJECTIVE DATA Goal status: ongoing 03/06/2023  Patient ambulates >50' with RW & prosthesis only with family / CNA assist safely.  Baseline: SEE OBJECTIVE DATA Goal status:  ongoing 03/06/2023  Patient transfers stand pivot including sit to/from stand with RW with minA or less.  Baseline: SEE OBJECTIVE DATA Goal status:  updated 03/06/2023   PLAN:  PT FREQUENCY: 2x/week  PT DURATION: 13 weeks / 90 days  PLANNED INTERVENTIONS: Therapeutic exercises, Therapeutic activity, Neuromuscular re-education, Balance training, Gait training, Patient/Family education, Self Care, Vestibular training, Prosthetic training, DME instructions, and Manual therapy  PLAN FOR NEXT SESSION: check how prosthetist appt went, check how wearing prosthesis to dialysis went, instruct in hamstring sets for functional activities to decrease distal anterior tibia pain,  continue gait and transfers including stand pivot with rolling walker, sitting balance activities.  Referring diagnosis? U98.119J (ICD-10-CM) - Below-knee amputation of right lower extremity  Treatment diagnosis? (if different than referring diagnosis)  Unsteadiness on feet R26.81   Other abnormalities of gait and mobility R26.89   Muscle weakness (generalized) M62.81   Hemiplegia and hemiparesis following cerebral infarction affecting left non-dominant side (HCC) I69.354   Non-pressure chronic ulcer of skin of other sites with  other specified severity (HCC) L98.498   Chronic pain of both knees M25.561, M25.562, G89.29   What was this (referring dx) caused by? [x]  Surgery []  Fall []  Ongoing issue []  Arthritis []  Other: ____________   Laterality: [x]  Rt []  Lt []  Both   Check all possible CPT codes:                   *CHOOSE 10 OR LESS*                                [x]  97110 (Therapeutic Exercise)                   []  92507 (SLP Treatment)      [x]  97112 (Neuro Re-ed)                                []  92526 (Swallowing Treatment)                  [  x] [phone] (Gait Training)                                []  [phone] (Cognitive Training, 1st 15 minutes) []  97140 (Manual Therapy)                           []  97130 (Cognitive Training, each add'l 15 minutes)         [x]  09811 (Re-evaluation)                              []  Other, List CPT Code ____________  [x]  97530 (Therapeutic Activities)                                            [x]  97535 (Self Care)               []  All codes above (97110 - 97535)             []  97012 (Mechanical Traction)             []  97014 (E-stim Unattended)             []  97032 (E-stim manual)             []  97033 (Ionto)             []  97035 (Ultrasound) [x]  97750 (Physical Performance Training) []  [id number] (Aquatic Therapy) []  97016 (Vasopneumatic Device) []  [id number] (Paraffin) []  97034 (Contrast Bath) []  97597 (Wound Care 1st 20 sq cm) []  97598 (Wound Care each add'l 20 sq cm) []  97760 (Orthotic Fabrication, Fitting, Training Initial) [x]  [id number] (Prosthetic Management and Training Initial) []  [id number] (Orthotic or Prosthetic Training/ Modification Subsequent)      Vladimir Faster, PT, DPT 03/06/2023, 4:41 PM

## 2023-03-10 ENCOUNTER — Encounter: Payer: Self-pay | Admitting: Physical Medicine and Rehabilitation

## 2023-03-10 ENCOUNTER — Encounter: Payer: Medicare PPO | Attending: Physical Medicine and Rehabilitation | Admitting: Physical Medicine and Rehabilitation

## 2023-03-10 VITALS — BP 173/79 | HR 75 | Ht 63.0 in

## 2023-03-10 DIAGNOSIS — I6381 Other cerebral infarction due to occlusion or stenosis of small artery: Secondary | ICD-10-CM

## 2023-03-10 DIAGNOSIS — G8194 Hemiplegia, unspecified affecting left nondominant side: Secondary | ICD-10-CM | POA: Diagnosis not present

## 2023-03-10 DIAGNOSIS — T82898S Other specified complication of vascular prosthetic devices, implants and grafts, sequela: Secondary | ICD-10-CM

## 2023-03-10 DIAGNOSIS — Z89511 Acquired absence of right leg below knee: Secondary | ICD-10-CM

## 2023-03-10 MED ORDER — METHYLPHENIDATE HCL 5 MG PO TABS: 5.0000 mg | ORAL_TABLET | Freq: Two times a day (BID) | ORAL | 0 refills | Status: DC

## 2023-03-10 MED ORDER — TRAZODONE HCL 50 MG PO TABS: 25.0000 mg | ORAL_TABLET | Freq: Every evening | ORAL | 5 refills | Status: DC | PRN

## 2023-03-10 NOTE — Progress Notes (Signed)
Subjective:    Patient ID: Carolyn Russo, female    DOB: 1950-11-21, 72 y.o.   MRN: [id number]  HPI  Pt is a 72 yr old female with recent hx of L hemiparesis due to R basal ganglia infarct in 7/22; R hip bursitis; anxiety; LGIB; dCHF; on HD from ESRD; HTN, BMI of 34 and DM- A1c 5.2- here for f/u on stroke and R hand weakness/numbness.  Also sedation with stroke.  and R BKA in late 2023.  She has also had recent admission with pneumonia.      Got her prosthesis- for R BKA- Healed over- no issues- occ small scab- healed over- got nondissolvable suture pulled out a few weeks ago.   Vomited after HD this AM- doing HD at 6am now- M/W/F- and so tired now.   BP today has been higher- gets Losartan, but holding B blocker and Hydralazine before HD.   Has some Ritalin- but doesn't take Daily-  Not giving on HD days- would rather sleep during HD. Still helpful.   Wakes up several times per night- 3-4x/night- sometimes goes back to sleep, sometimes doesn't/is able to go back to sleep.   Wakes up because problems breathing.  Doesn't have CPAP.  Had a sleep study 2+ years ago- didn't have sleep apnea at that time.   PCP had done a test for home O2- but didn't meet criteria.   Not SOB unless laying down- has hospital bed, but feels SOB when laying down- caregivers sitting around with sweater/coat on because she's always hot as well.  Sometimes takes Benadryl  and melatonin 5 mg (gives 1/2 dose) for sleep. Not sure melatonin helps.  Benadryl 25 mg- ~2x/week.  Cannot really tell if sleeps better with benadryl.    Calcacalcet- on HD days- but been having HA's since started it- so husband was concerned causing HA's. 3 severe HA's recently.        Pain Inventory Average Pain 8 Pain Right Now 0 My pain is intermittent and sharp  LOCATION OF PAIN  Left abdomen pain below the breast  BOWEL Number of stools per week: 7 Oral laxative use No   BLADDER Pads    Mobility use a  cane how many minutes can you walk? Working on it with assistance ability to climb steps?  no use a wheelchair needs help with transfers Do you have any goals in this area?  yes  Function retired I need assistance with the following:  dressing, bathing, toileting, meal prep, household duties, and shopping Do you have any goals in this area?  yes  Neuro/Psych weakness trouble walking dizziness anxiety  Prior Studies Any changes since last visit?  yes Seen at [facility] for Pneumonia April 2024  Physicians involved in your care Any changes since last visit?  yes [facility]   Family History  Problem Relation Age of Onset   Cancer Mother        abdominal melamona   Psoriasis Mother    Alzheimer's disease Father    Diabetes Maternal Aunt    Cancer Cousin        colon    Diabetes Cousin    Colon cancer Neg Hx    Colon polyps Neg Hx    Esophageal cancer Neg Hx    Cancer - Colon Neg Hx    Liver disease Neg Hx    Social History   Socioeconomic History   Marital status: Married    Spouse name: Channing Mutters   Number  of children: 1   Years of education: Not on file   Highest education level: Professional school degree (e.g., MD, DDS, DVM, JD)  Occupational History   Occupation: retired  Tobacco Use   Smoking status: Never   Smokeless tobacco: Never  Vaping Use   Vaping Use: Never used  Substance and Sexual Activity   Alcohol use: No   Drug use: No   Sexual activity: Not Currently    Birth control/protection: Post-menopausal  Other Topics Concern   Not on file  Social History Narrative   06/21/21 lives with husband Dr Chalmers Guest, caregiver- Joyce Gross   Patient reports she used to walk at [employer] but now due to Covid-19 she is staying inside.   Social Determinants of Health   Financial Resource Strain: Low Risk  (09/02/2021)   Overall Financial Resource Strain (CARDIA)    Difficulty of Paying Living Expenses: Not hard at all  Food Insecurity: No Food Insecurity (02/10/2023)    Hunger Vital Sign    Worried About Running Out of Food in the Last Year: Never true    Ran Out of Food in the Last Year: Never true  Transportation Needs: No Transportation Needs (02/09/2023)   PRAPARE - Administrator, Civil Service (Medical): No    Lack of Transportation (Non-Medical): No  Physical Activity: Inactive (09/02/2021)   Exercise Vital Sign    Days of Exercise per Week: 0 days    Minutes of Exercise per Session: 0 min  Stress: Stress Concern Present (09/02/2021)   [employer] of Occupational Health - Occupational Stress Questionnaire    Feeling of Stress : To some extent  Social Connections: Socially Integrated (01/09/2019)   Social Connection and Isolation Panel [NHANES]    Frequency of Communication with Friends and Family: More than three times a week    Frequency of Social Gatherings with Friends and Family: More than three times a week    Attends Religious Services: More than 4 times per year    Active Member of Clubs or Organizations: Yes    Attends Banker Meetings: More than 4 times per year    Marital Status: Married   Past Surgical History:  Procedure Laterality Date   ABDOMINAL AORTOGRAM W/LOWER EXTREMITY N/A 02/21/2022   Procedure: ABDOMINAL AORTOGRAM W/LOWER EXTREMITY;  Surgeon: Maeola Harman, MD;  Location: [facility] INVASIVE CV LAB;  Service: Cardiovascular;  Laterality: N/A;   AMPUTATION Right 05/18/2022   Procedure: RIGHT BELOW KNEE AMPUTATION;  Surgeon: Nadara Mustard, MD;  Location: [facility] OR;  Service: Orthopedics;  Laterality: Right;   AMPUTATION TOE Right 04/15/2022   Procedure: AMPUTATION  LST TOERIGHT FOOT;  Surgeon: Edwin Cap, DPM;  Location: [facility] ORS;  Service: Podiatry;  Laterality: Right;   APPENDECTOMY  1959   BASCILIC VEIN TRANSPOSITION Right 04/30/2021   Procedure: RIGHT FIRST STAGE BASCILIC VEIN TRANSPOSITION;  Surgeon: Chuck Hint, MD;  Location: [facility] OR;  Service: Vascular;  Laterality: Right;    CESAREAN SECTION  1985   COLONOSCOPY     ESOPHAGOGASTRODUODENOSCOPY (EGD) WITH PROPOFOL N/A 04/18/2021   Procedure: ESOPHAGOGASTRODUODENOSCOPY (EGD) WITH PROPOFOL;  Surgeon: Iva Boop, MD;  Location: [facility] ENDOSCOPY;  Service: Endoscopy;  Laterality: N/A;   EYE SURGERY     bilateral cataract    FLEXIBLE SIGMOIDOSCOPY N/A 04/18/2021   Procedure: FLEXIBLE SIGMOIDOSCOPY;  Surgeon: Iva Boop, MD;  Location: [facility] ENDOSCOPY;  Service: Endoscopy;  Laterality: N/A;   HERNIA REPAIR  1959   IR FLUORO  GUIDE CV LINE RIGHT  03/18/2021   IR US GUIDE VASC ACCESS RIGHT  03/18/2021   LEFT HEART CATH AND CORONARY ANGIOGRAPHY N/A 04/04/2017   Procedure: Left Heart Cath and Coronary Angiography;  Surgeon: Lyn Records, MD;  Location: [facility] INVASIVE CV LAB;  Service: Cardiovascular;  Laterality: N/A;   MYOMECTOMY  1980, 2004, 2007   PERIPHERAL VASCULAR THROMBECTOMY  02/21/2022   Procedure: PERIPHERAL VASCULAR THROMBECTOMY;  Surgeon: Maeola Harman, MD;  Location: [facility] INVASIVE CV LAB;  Service: Cardiovascular;;  Right Tibial   RHINOPLASTY  1971   ROTATOR CUFF REPAIR  2003   SURGICAL REPAIR OF HEMORRHAGE  2015   TONSILLECTOMY  1968   Past Medical History:  Diagnosis Date   Acute GI bleeding    Allergy    Anemia    Anterior chest wall pain    Appendicitis 1965   Asthma    Body mass index 37.0-37.9, adult    Breast pain    Cataract    both eyes   CHF (congestive heart failure) (HCC)    Chronic kidney disease    stage 5 - on dialysis   Cognitive change 04/20/2021   r/t cva 03/2821   Complication of anesthesia    Memory loss after general   Dehydration 2014   Deviated septum 1971   Diabetes mellitus    Dysphagia due to old stroke    easy to get strangled when eating   Dyspnea 2014   Extrinsic asthma    WITH ASTHMA ATTACK   Fibroid 1980   GERD (gastroesophageal reflux disease)    Heart murmur    History of migraine    History of seizure    with stroke   Hx gestational diabetes     Hyperlipidemia    Hypertension 2014   Inguinal hernia 1959   Malaise and fatigue 2014   Non-IgE mediated allergic asthma 2014   Obesity    Pelvic pain    Pregnancy, high-risk 1985   Stroke (HCC) 04/20/2021   (CVA) of right basal ganglia   Tonsillitis 1968   Uterine fibroid 1980   Visual field defect    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   Ht 5\' 3"  (1.6 m)   BMI 31.89 kg/m   Opioid Risk Score:   Fall Risk Score:  `1  Depression screen [facility] 2/9     12/09/2022    2:48 PM 09/24/2021    1:58 PM 09/02/2021   12:26 PM 06/25/2021   11:24 AM 11/26/2020    3:13 PM 08/12/2020    3:46 PM 06/18/2019    2:03 PM  Depression screen PHQ 2/9  Decreased Interest 0 1 0 1 0 0 0  Down, Depressed, Hopeless 0 1 0 1 0 0 0  PHQ - 2 Score 0 2 0 2 0 0 0  Altered sleeping    0     Tired, decreased energy    3     Change in appetite    0     Feeling bad or failure about yourself     0     Trouble concentrating    0     Moving slowly or fidgety/restless    0     Suicidal thoughts    0     PHQ-9 Score    5     Difficult doing work/chores    Somewhat difficult       Review of Systems  Gastrointestinal:  Positive for abdominal pain.  Musculoskeletal:  Positive for gait problem.  Neurological:  Positive for dizziness and weakness.  Psychiatric/Behavioral:         Anxiety  All other systems reviewed and are negative.     Objective:   Physical Exam  Asleep- wake sup briefly, c/o not feeling well since HD, accompanied by husband and caregiver, NAD C/o side hurting Wearing R BKA  RUE 5/5 except R hand 4/5;  LUE 4+/5 in LUE-  Mild spasticity on LUE and LLE Clawing- of L hand- hoffman's (+)  RLE- 4/5 in R HF/KE/KF LLE- 4/5 in LLE  Leaning to L side somewhat- won't keep eyes open    Assessment & Plan:   Pt is a 72 yr old female with recent hx of L hemiparesis due to R basal ganglia infarct in 7/22; R hip bursitis; anxiety; LGIB; dCHF; on HD from ESRD; HTN, BMI of 34 and DM- A1c 5.2- here for f/u on  stroke and R hand weakness/numbness.  Also sedation with stroke.  She has also had recent admission with pneumonia.    Still helpful, so will con't Ritalin 5 mg BID-AC   2. Trazodone- 25 mg 1/2 tab- nightly as needed- #20- 5 refills- don't take on nights that has HD immediately. But if no hangover effect, then can try.  3.  3 inch mattress topper from store/[employer]- to see if would be comfortable?  4. Side pain/hurting- might be constipation- might be due to positioning.   5. Has eye itching- from pollen.   6. Using R BKA- better transfers with R BKA prosthesis- but not really walking.   7. F/u in 3 months due to Ritalin usage.    8. Con't therapy for R BKA   I spent a total of  30  minutes on total care today- >50% coordination of care- due to  Discussion of R BKA, stroke, spasticity- cannot treat due to ESRD- any stretching is good.  Ideally stretching 2-3x/day

## 2023-03-10 NOTE — Patient Instructions (Addendum)
Pt is a 72 yr old female with recent hx of L hemiparesis due to R basal ganglia infarct in 7/22; R hip bursitis; anxiety; LGIB; dCHF; on HD from ESRD; HTN, BMI of 34 and DM- A1c 5.2- here for f/u on stroke and R hand weakness/numbness.  Also sedation with stroke.  She has also had recent admission with pneumonia.    Still helpful, so will con't Ritalin 5 mg BID-AC   2. Trazodone- 25 mg 1/2 tab- nightly as needed- #20- 5 refills- don't take on nights that has HD immediately. But if no hangover effect, then can try.  3.  3 inch mattress topper from store/[employer]- to see if would be comfortable?  4. Side pain/hurting- might be constipation- might be due to positioning.   5. Has eye itching- from pollen.   6. Using R BKA- better transfers with R BKA prosthesis- but not really walking.   7. F/u in 3 months due to Ritalin usage.   any stretching is good.  Ideally stretching 2-3x/day

## 2023-03-16 ENCOUNTER — Encounter: Payer: Self-pay | Admitting: Physical Therapy

## 2023-03-16 ENCOUNTER — Ambulatory Visit: Payer: Medicare PPO | Admitting: Physical Therapy

## 2023-03-16 DIAGNOSIS — G8929 Other chronic pain: Secondary | ICD-10-CM

## 2023-03-16 DIAGNOSIS — I69354 Hemiplegia and hemiparesis following cerebral infarction affecting left non-dominant side: Secondary | ICD-10-CM | POA: Diagnosis not present

## 2023-03-16 DIAGNOSIS — R2681 Unsteadiness on feet: Secondary | ICD-10-CM | POA: Diagnosis not present

## 2023-03-16 DIAGNOSIS — R2689 Other abnormalities of gait and mobility: Secondary | ICD-10-CM

## 2023-03-16 DIAGNOSIS — M25561 Pain in right knee: Secondary | ICD-10-CM

## 2023-03-16 DIAGNOSIS — R278 Other lack of coordination: Secondary | ICD-10-CM

## 2023-03-16 DIAGNOSIS — M6281 Muscle weakness (generalized): Secondary | ICD-10-CM

## 2023-03-16 DIAGNOSIS — M25562 Pain in left knee: Secondary | ICD-10-CM

## 2023-03-16 NOTE — Therapy (Signed)
OUTPATIENT PHYSICAL THERAPY TREATMENT  Patient Name: Carolyn Russo MRN: [id number] DOB:03/06/1951, 72 y.o., female Today's Date: 03/16/2023  PCP: No PCP on file REFERRING PROVIDER: Aldean Baker, MD     END OF SESSION:  PT End of Session - 03/16/23 1453     Visit Number 11    Number of Visits 36    Date for PT Re-Evaluation 06/01/23    Authorization Type [employer] PPO    Authorization Time Period $20 co-pay    Progress Note Due on Visit 20    PT Start Time 1430    PT Stop Time 1515    PT Time Calculation (min) 45 min    Equipment Utilized During Treatment Gait belt    Activity Tolerance Patient tolerated treatment well;Patient limited by fatigue;Patient limited by pain    Behavior During Therapy WFL for tasks assessed/performed                Past Medical History:  Diagnosis Date   Acute GI bleeding    Allergy    Anemia    Anterior chest wall pain    Appendicitis 1965   Asthma    Body mass index 37.0-37.9, adult    Breast pain    Cataract    both eyes   CHF (congestive heart failure) (HCC)    Chronic kidney disease    stage 5 - on dialysis   Cognitive change 04/20/2021   r/t cva 03/2821   Complication of anesthesia    Memory loss after general   Dehydration 2014   Deviated septum 1971   Diabetes mellitus    Dysphagia due to old stroke    easy to get strangled when eating   Dyspnea 2014   Extrinsic asthma    WITH ASTHMA ATTACK   Fibroid 1980   GERD (gastroesophageal reflux disease)    Heart murmur    History of migraine    History of seizure    with stroke   Hx gestational diabetes    Hyperlipidemia    Hypertension 2014   Inguinal hernia 1959   Malaise and fatigue 2014   Non-IgE mediated allergic asthma 2014   Obesity    Pelvic pain    Pregnancy, high-risk 1985   Stroke (HCC) 04/20/2021   (CVA) of right basal ganglia   Tonsillitis 1968   Uterine fibroid 1980   Visual field defect    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   Past Surgical  History:  Procedure Laterality Date   ABDOMINAL AORTOGRAM W/LOWER EXTREMITY N/A 02/21/2022   Procedure: ABDOMINAL AORTOGRAM W/LOWER EXTREMITY;  Surgeon: Maeola Harman, MD;  Location: [facility] INVASIVE CV LAB;  Service: Cardiovascular;  Laterality: N/A;   AMPUTATION Right 05/18/2022   Procedure: RIGHT BELOW KNEE AMPUTATION;  Surgeon: Nadara Mustard, MD;  Location: [facility] OR;  Service: Orthopedics;  Laterality: Right;   AMPUTATION TOE Right 04/15/2022   Procedure: AMPUTATION  LST TOERIGHT FOOT;  Surgeon: Edwin Cap, DPM;  Location: [facility] ORS;  Service: Podiatry;  Laterality: Right;   APPENDECTOMY  1959   BASCILIC VEIN TRANSPOSITION Right 04/30/2021   Procedure: RIGHT FIRST STAGE BASCILIC VEIN TRANSPOSITION;  Surgeon: Chuck Hint, MD;  Location: [facility] OR;  Service: Vascular;  Laterality: Right;   CESAREAN SECTION  1985   COLONOSCOPY     ESOPHAGOGASTRODUODENOSCOPY (EGD) WITH PROPOFOL N/A 04/18/2021   Procedure: ESOPHAGOGASTRODUODENOSCOPY (EGD) WITH PROPOFOL;  Surgeon: Iva Boop, MD;  Location: [facility] ENDOSCOPY;  Service: Endoscopy;  Laterality: N/A;  EYE SURGERY     bilateral cataract    FLEXIBLE SIGMOIDOSCOPY N/A 04/18/2021   Procedure: FLEXIBLE SIGMOIDOSCOPY;  Surgeon: Iva Boop, MD;  Location: [facility] ENDOSCOPY;  Service: Endoscopy;  Laterality: N/A;   HERNIA REPAIR  1959   IR FLUORO GUIDE CV LINE RIGHT  03/18/2021   IR US GUIDE VASC ACCESS RIGHT  03/18/2021   LEFT HEART CATH AND CORONARY ANGIOGRAPHY N/A 04/04/2017   Procedure: Left Heart Cath and Coronary Angiography;  Surgeon: Lyn Records, MD;  Location: [facility] INVASIVE CV LAB;  Service: Cardiovascular;  Laterality: N/A;   MYOMECTOMY  1980, 2004, 2007   PERIPHERAL VASCULAR THROMBECTOMY  02/21/2022   Procedure: PERIPHERAL VASCULAR THROMBECTOMY;  Surgeon: Maeola Harman, MD;  Location: [facility] INVASIVE CV LAB;  Service: Cardiovascular;;  Right Tibial   RHINOPLASTY  1971   ROTATOR CUFF REPAIR  2003   SURGICAL REPAIR OF HEMORRHAGE   2015   TONSILLECTOMY  1968   Patient Active Problem List   Diagnosis Date Noted   Hx of right BKA (HCC) 03/10/2023   CHF (congestive heart failure) (HCC) 02/10/2023   Dyspnea 02/09/2023   Acute respiratory distress 02/09/2023   Urinary tract infection without hematuria    Diabetes mellitus due to underlying condition with hypoglycemia without coma, with long-term current use of insulin (HCC)    Seizure disorder (HCC) 06/22/2022   Acute respiratory failure (HCC)    Unresponsiveness    Pressure ulcer 06/07/2022   Acute stroke due to ischemia (HCC) 06/03/2022   Chest pain 06/01/2022   History of CVA (cerebrovascular accident) 06/01/2022   Seizure (HCC)    Encephalopathy    Below-knee amputation of right lower extremity (HCC)    Leukocytosis    Foot infection 05/10/2022   PAD (peripheral artery disease) (HCC) 05/10/2022   Gangrene of right foot (HCC) 04/15/2022   Left hemiparesis (HCC) 09/24/2021   Abnormality of gait 09/24/2021   Pain of right hand 08/06/2021   Steal syndrome of dialysis vascular access (HCC) 08/06/2021   Subclavian steal syndrome of right subclavian artery 06/25/2021   Right hand weakness 06/25/2021   Stroke-like symptoms 05/06/2021   Bandemia    Cerebrovascular accident (CVA) of right basal ganglia (HCC) 04/20/2021   Hemorrhoids    Basal ganglia infarction (HCC) 04/16/2021   ESRD on dialysis (HCC) 04/16/2021   Hypertensive urgency 03/10/2021   Hilar enlargement    Anemia of chronic disease    Chronic diastolic CHF (congestive heart failure) (HCC) 03/09/2021   CKD (chronic kidney disease), stage V (HCC) 03/08/2021   Diabetic retinopathy (HCC) 03/08/2021   Hyperglycemia due to type 2 diabetes mellitus (HCC) 03/08/2021   Pure hypercholesterolemia 03/08/2021   Anemia in chronic kidney disease 09/24/2020   Uncontrolled type 2 diabetes mellitus with hyperglycemia (HCC) 03/19/2019   Osteopenia 09/04/2018   Migraine 09/04/2018   Asthma 09/04/2018    Pseudophakia of both eyes 07/17/2018   Pseudophakia of left eye 07/03/2018   Open angle with borderline findings and high glaucoma risk in left eye 07/03/2018   Age-related nuclear cataract of right eye 07/03/2018   CAD (coronary artery disease), native coronary artery 04/27/2017   Abnormal nuclear stress test 04/04/2017   Shortness of breath 03/09/2017   Appendicitis    Age-related hypermature cataract of both eyes 12/20/2016   Uterine leiomyoma 11/27/2012   Dyslipidemia (high LDL; low HDL) 11/25/2011   Obesity (BMI 30-39.9) 11/25/2011   Hypertensive disorder 11/21/2011   Type 2 diabetes mellitus (HCC) 11/21/2011   Abnormal EKG  11/21/2011    ONSET DATE: 12/29/2022 prosthesis delivery  REFERRING DIAG: W29.562Z (ICD-10-CM) - Below-knee amputation of right lower extremity   THERAPY DIAG:  Unsteadiness on feet  Other abnormalities of gait and mobility  Muscle weakness (generalized)  Hemiplegia and hemiparesis following cerebral infarction affecting left non-dominant side (HCC)  Chronic pain of both knees  Other lack of coordination  Rationale for Evaluation and Treatment: Rehabilitation  SUBJECTIVE:   SUBJECTIVE STATEMENT: She relays still trying to get her energy levels and breathing levels back recovering from PNA. She saw prosthetist again and had adjustments to prosthesis. She denies pain upon arrival.   PERTINENT HISTORY: CVA / left hemiparesis due to right basal ganglia infarct 7/22, right hip bursitis, anxiety, dCHF, ESRD on HD, HTN, DM, CAD  PAIN:  Are you having pain?  Yes: NPRS scale:0/10 sitting & standing 5-6/10 (less frequent & not as loud c/o pain in PT today) Pain location: bilateral knees more patella Pain description: throbbing Aggravating factors: unknown Relieving factors: Voltarin & massage  PRECAUTIONS: Fall and Other: NO BP RUE  WEIGHT BEARING RESTRICTIONS: No  FALLS: Has patient fallen in last 6 months? No  LIVING ENVIRONMENT: Lives with:  lives with their spouse and CNAs 24 hrs 7 days / wk Lives in: House Home Access: Ramped entrance Home layout: Multi level, Full bath on main level, and Able to live on main level with bedroom and bathroom Stairs: Yes: Internal: 16 steps; on left going up and External: 4 steps; on right going up Has following equipment at home: Single point cane, Walker - 2 wheeled, Wheelchair (manual), and shower chair  PLOF: Independent with household mobility with device and Independent with community mobility with device  PATIENT GOALS: walk with prosthesis, stand & transfer.   OBJECTIVE:  COGNITION: Overall cognitive status: History of cognitive impairments - at baseline  POSTURE: rounded shoulders, forward head, decreased lumbar lordosis, increased thoracic kyphosis, and flexed trunk   LOWER EXTREMITY ROM: Eval / 01/05/2023:    BLEs PROM WFL except unable to assess hip ext  (Blank rows = not tested)  LOWER EXTREMITY MMT:  MMT Right eval Right 03/06/23  Hip flexion 3-/5 3-/5  Hip extension 3-/5 3-/5  Hip abduction 3-/5 3-/5  Hip adduction    Hip internal rotation    Hip external rotation    Knee flexion 3-/5 3-/5  Knee extension 3-/5 3-/5  Ankle dorsiflexion NA NA  Ankle plantarflexion NA NA  Ankle inversion NA NA  Ankle eversion NA NA  03/06/2023: LLE hemiparesis but able to advance during gait without assistance.   Eval / 01/05/2023: left LE & UE hemiparesis.  In gait slides to advance LLE and knee / hip instability in stance.  (Blank rows = not tested)  TRANSFERS: 03/06/2023:  Sit to stand w/c & 18" chair with armrests to RW with modA. Stand to sit with minA. Stand-pivot  transfer with RW with modA / constant cueing.  02/07/2023: Sit to stand w/c or 18" chair with armrests to RW with modA. Stand to sit with minA. Both require constant cues. Stand-pivot transfer with RW with modA / constant cueing. (2nd person for safety)   01/05/2023 / Eval: Sit to stand: Max A pulling BUEs on  //bars from w/c Stand to sit: Min A using BUEs //bars to control descent Lateral scoot transfers: Mod A w/c to / from mat table level & adjacent   GAIT: 03/06/2023: Pt amb 5' with RW with +2 modA including 90* turn to chair.  02/07/2023: Pt amb 5' with RW with +2 modA including 90* turn to chair.   01/05/2023 / Eval: Gait pattern: step to pattern, decreased step length- Right, decreased step length- Left, decreased stance time- Right, decreased stance time- Left, decreased hip/knee flexion- Left, decreased ankle dorsiflexion- Left, Right hip hike, Left hip hike, knee flexed in stance- Left, antalgic, lateral hip instability, trunk flexed, wide BOS, and poor foot clearance- Left Distance walked: 3' Assistive device utilized: // bars & RLE TTA prosthesis Level of assistance: Total A / 2 person Comments:  LLE with poor clearance / slides foot forward and knee / hip instability in stance.   FUNCTIONAL TESTs:  03/06/2023: static stance with RW support 60 sec with minA.  Eval / 01/05/2023:  Standing in //bars for 30 sec with modA.    CURRENT PROSTHETIC WEAR ASSESSMENT: 03/06/2023: Patient's husband & CNA correctly don & doff her prosthesis. Pt is dependent requiring maxA to don or doff.  She reports wearing prosthesis 5 hrs 2x/day for 10 hours total on non-dialysis days and up to 8 hours on dialysis days.  Her limb has scab on lateral incision that appears to be internal suture working way out. No signs of infection. Her residual limb appearance is dry skin, normal temperature, limited to no hair growth, cylindrical shape.   Eval / 01/05/2023:  Patient is dependent with: skin check, residual limb care, care of non-amputated limb, prosthetic cleaning, ply sock cleaning, correct ply sock adjustment, proper wear schedule/adjustment, and proper weight-bearing schedule/adjustment Donning prosthesis: Total A / family & CNA requires 100% cueing Doffing prosthesis: Total A / family & CNA requires 100%  cueing Prosthetic wear tolerance: 1-1.5 hours, 1x/day, 6 of 7 days since delivery Prosthetic weight bearing tolerance: 3 minutes during eval with c/o knee pain Edema: pitting Residual limb condition: lateral incision with 3mm X 6mm long scabs with serous drainage, arrived with bandaid over wound,  dry skin, normal temperature & color, cylindrical shape Prosthetic description: silicon liner with pin lock suspension, total contact socket, SACH foot K code/activity level with prosthetic use: Level 1    TODAY'S TREATMENT:                                                                                                                             DATE:  03/16/2023: Prosthetic Training with TTA prosthesis Sit to stand with RW and patient pushing off wheelchair with mod assist, requires cues for set up, hand placement, and sequence, X 6 reps total Patient ambulated 10 feet, then 14 feet  with rolling walker and min A once ambulating one extra person for chair follow (CNA). Stand pivot transfers from Wheelchair to standard chair with arms placed 90 deg to simulate toilet transfer. Transferred to chair and back with mod A to Rt and max A to left, with max cues for sequence for foot placement, where to push up from, RW management, and to shift weight fully onto one leg to lift  and turn on other leg, then to take steps back until she touches the chair with the back of her legs before sitting.  03/06/2023: Prosthetic Training with TTA prosthesis: See objective data for reassessment info.   PT recommended increasing wear to all awake hours except 1-2 hours midday.  Begin to wear to dialysis in 2 days.  Pt & husband verbalize understanding.  Pt reports that she takes sponge bath 2x/day.  PT recommended washing & drying area under liner first, then donning prosthesis prior to bathing remaining areas of body to enable prosthesis to assist with sitting balance & limited standing with RW to wash buttocks. Pt &  husband verbalized understanding.    02/07/2023: Prosthetic Training with TTA prosthesis Sit to stand with RW and patient pushing off wheelchair with mod assist, requires cues for set up, hand placement, and sequence, X 6 reps total Stand pivot transfers from Wheelchair to standard chair with arms placed 90 deg using RW with modA (2nd person for safety).  PT demo & verbal cues for technique prior & during.  Patient ambulated 5 feet X 2 with rolling walker walking to 2nd chair 90* turn to position to sit down (1st to right & 2nd to left) with mod assist +2 person. PT demo & verbal cues on donning including managing distal 3-4" soft tissue beyond tibia. Pt's husband verbalized understanding.  PT recommended increasing wear to 5 hours 2x/day (only 1x on dialysis days).  If no issues today to Sunday (5 days) then wear prosthesis to dialysis Monday. Don prior to leaving house and doff upon return for 2-3 hours. Wear 2nd time after break / nap. 2kg weight of prosthesis needs to be subtracted from weigh-in & weigh-out of dialysis. Pt & husband verbalize understanding.     HOME EXERCISE PROGRAM:  ASSESSMENT:  CLINICAL IMPRESSION: She did have less complaints of Rt leg pain today with ambulation and after prosthetist made adjustments of what sounds like pads were added. She did ambulate further than she has in the past up to 14 feet as max tolerated distance. SPO2 levels monitored and did not drop below 97% and HR not above 71 with activity. Her back pain did limit her standing tolerance today with transfers and she had more difficulty transferring to her left than Right.  She will continue to benefit from skilled PT to improve functional abilities.   OBJECTIVE IMPAIRMENTS: Abnormal gait, decreased activity tolerance, decreased balance, decreased cognition, decreased coordination, decreased endurance, decreased knowledge of condition, decreased knowledge of use of DME, decreased mobility, difficulty  walking, decreased ROM, decreased strength, postural dysfunction, prosthetic dependency , obesity, and pain.   ACTIVITY LIMITATIONS: standing, stairs, transfers, locomotion level, and safe prosthesis use.  PARTICIPATION LIMITATIONS: community activity, church, and household mobility  PERSONAL FACTORS: Age, Fitness, Past/current experiences, Time since onset of injury/illness/exacerbation, and 3+ comorbidities: see PMH  are also affecting patient's functional outcome.   REHAB POTENTIAL: Good  CLINICAL DECISION MAKING: Evolving/moderate complexity  EVALUATION COMPLEXITY: Moderate   GOALS: Goals reviewed with patient? Yes  SHORT TERM GOALS: Target date: 04/06/2023  Patient reports how to correctly adjust dialysis weight and and weigh out to include prosthesis. Baseline: SEE OBJECTIVE DATA Goal status: Set 03/06/2023 2.  Patient tolerates prosthesis >90% of awake hrs/day without increase in skin issues or limb / knee pain <5/10 with standing. Baseline: SEE OBJECTIVE DATA Goal status:  Set 03/06/2023  3.  Patient stand pivot transfer with RW with minA. Baseline: SEE OBJECTIVE DATA Goal status: Set  03/06/2023  4. Patient ambulates 10' including turning 90* to position to sit with RW with modA of one person.  Baseline: SEE OBJECTIVE DATA Goal status: Set 03/06/2023    LONG TERM GOALS: Target date: 06/01/2023  Patient & family / CNA demonstrates & verbalized understanding of prosthetic care to enable safe utilization of prosthesis. Baseline: SEE OBJECTIVE DATA Goal status: ongoing 03/06/2023  Patient tolerates prosthesis wear >90% of awake hours without skin or limb pain issues and knee pain </= 4/10.  Baseline: SEE OBJECTIVE DATA Goal status:  ongoing 03/06/2023  Stand balance with walker: static 2 min with supervision to enable ability for standing ADLS. Baseline: SEE OBJECTIVE DATA Goal status: ongoing 03/06/2023  Patient ambulates >50' with RW & prosthesis only with family / CNA  assist safely.  Baseline: SEE OBJECTIVE DATA Goal status:  ongoing 03/06/2023  Patient transfers stand pivot including sit to/from stand with RW with minA or less.  Baseline: SEE OBJECTIVE DATA Goal status:  updated 03/06/2023   PLAN:  PT FREQUENCY: 2x/week  PT DURATION: 13 weeks / 90 days  PLANNED INTERVENTIONS: Therapeutic exercises, Therapeutic activity, Neuromuscular re-education, Balance training, Gait training, Patient/Family education, Self Care, Vestibular training, Prosthetic training, DME instructions, and Manual therapy  PLAN FOR NEXT SESSION:  continue gait and transfers including stand pivot with rolling walker, sitting balance activities.  Referring diagnosis? Z61.096E (ICD-10-CM) - Below-knee amputation of right lower extremity  Treatment diagnosis? (if different than referring diagnosis)  Unsteadiness on feet R26.81   Other abnormalities of gait and mobility R26.89   Muscle weakness (generalized) M62.81   Hemiplegia and hemiparesis following cerebral infarction affecting left non-dominant side (HCC) I69.354   Non-pressure chronic ulcer of skin of other sites with other specified severity [facility]) L98.498   Chronic pain of both knees M25.561, M25.562, G89.29   What was this (referring dx) caused by? [x]  Surgery []  Fall []  Ongoing issue []  Arthritis []  Other: ____________   Laterality: [x]  Rt []  Lt []  Both   Check all possible CPT codes:                   *CHOOSE 10 OR LESS*                                [x]  45409 (Therapeutic Exercise)                   []  92507 (SLP Treatment)      [x]  97112 (Neuro Re-ed)                                []  92526 (Swallowing Treatment)                  [x]  81191 (Gait Training)                                []  [id number] (Cognitive Training, 1st 15 minutes) []  97140 (Manual Therapy)                           []  97130 (Cognitive Training, each add'l 15 minutes)         [x]  97164 (Re-evaluation)                              []   Other, List CPT Code ____________  [x]  97530 (Therapeutic Activities)                                            [x]  97535 (Self Care)               []  All codes above (97110 - 97535)             []  97012 (Mechanical Traction)             []  97014 (E-stim Unattended)             []  97032 (E-stim manual)             []  97033 (Ionto)             []  97035 (Ultrasound) [x]  97750 (Physical Performance Training) []  [id number] (Aquatic Therapy) []  97016 (Vasopneumatic Device) []  [id number] (Paraffin) []  97034 (Contrast Bath) []  97597 (Wound Care 1st 20 sq cm) []  40981 (Wound Care each add'l 20 sq cm) []  97760 (Orthotic Fabrication, Fitting, Training Initial) [x]  [id number] (Prosthetic Management and Training Initial) []  [id number] (Orthotic or Prosthetic Training/ Modification Subsequent)      April Manson, PT, DPT 03/16/2023, 3:22 PM

## 2023-03-21 ENCOUNTER — Encounter: Payer: Self-pay | Admitting: Physical Therapy

## 2023-03-21 ENCOUNTER — Ambulatory Visit: Payer: Medicare PPO | Admitting: Physical Therapy

## 2023-03-21 DIAGNOSIS — M6281 Muscle weakness (generalized): Secondary | ICD-10-CM | POA: Diagnosis not present

## 2023-03-21 DIAGNOSIS — R278 Other lack of coordination: Secondary | ICD-10-CM

## 2023-03-21 DIAGNOSIS — I69354 Hemiplegia and hemiparesis following cerebral infarction affecting left non-dominant side: Secondary | ICD-10-CM

## 2023-03-21 DIAGNOSIS — G8929 Other chronic pain: Secondary | ICD-10-CM

## 2023-03-21 DIAGNOSIS — M25561 Pain in right knee: Secondary | ICD-10-CM

## 2023-03-21 DIAGNOSIS — M25562 Pain in left knee: Secondary | ICD-10-CM

## 2023-03-21 DIAGNOSIS — R2689 Other abnormalities of gait and mobility: Secondary | ICD-10-CM

## 2023-03-21 DIAGNOSIS — R2681 Unsteadiness on feet: Secondary | ICD-10-CM | POA: Diagnosis not present

## 2023-03-21 NOTE — Therapy (Signed)
OUTPATIENT PHYSICAL THERAPY TREATMENT  Patient Name: Carolyn Russo MRN: [id number] DOB:12-17-1950, 72 y.o., female Today's Date: 03/21/2023  PCP: No PCP on file REFERRING PROVIDER: Aldean Baker, MD     END OF SESSION:  PT End of Session - 03/21/23 1532     Visit Number 12    Number of Visits 36    Date for PT Re-Evaluation 06/01/23    Authorization Type [employer] PPO    Authorization Time Period $20 co-pay    Progress Note Due on Visit 20    PT Start Time 1444   arrives late   PT Stop Time 1516    PT Time Calculation (min) 32 min    Equipment Utilized During Treatment Gait belt    Activity Tolerance Patient tolerated treatment well;Patient limited by fatigue;Patient limited by pain    Behavior During Therapy WFL for tasks assessed/performed                Past Medical History:  Diagnosis Date   Acute GI bleeding    Allergy    Anemia    Anterior chest wall pain    Appendicitis 1965   Asthma    Body mass index 37.0-37.9, adult    Breast pain    Cataract    both eyes   CHF (congestive heart failure) (HCC)    Chronic kidney disease    stage 5 - on dialysis   Cognitive change 04/20/2021   r/t cva 03/2821   Complication of anesthesia    Memory loss after general   Dehydration 2014   Deviated septum 1971   Diabetes mellitus    Dysphagia due to old stroke    easy to get strangled when eating   Dyspnea 2014   Extrinsic asthma    WITH ASTHMA ATTACK   Fibroid 1980   GERD (gastroesophageal reflux disease)    Heart murmur    History of migraine    History of seizure    with stroke   Hx gestational diabetes    Hyperlipidemia    Hypertension 2014   Inguinal hernia 1959   Malaise and fatigue 2014   Non-IgE mediated allergic asthma 2014   Obesity    Pelvic pain    Pregnancy, high-risk 1985   Stroke (HCC) 04/20/2021   (CVA) of right basal ganglia   Tonsillitis 1968   Uterine fibroid 1980   Visual field defect    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    Past Surgical History:  Procedure Laterality Date   ABDOMINAL AORTOGRAM W/LOWER EXTREMITY N/A 02/21/2022   Procedure: ABDOMINAL AORTOGRAM W/LOWER EXTREMITY;  Surgeon: Maeola Harman, MD;  Location: [facility] INVASIVE CV LAB;  Service: Cardiovascular;  Laterality: N/A;   AMPUTATION Right 05/18/2022   Procedure: RIGHT BELOW KNEE AMPUTATION;  Surgeon: Nadara Mustard, MD;  Location: [facility] OR;  Service: Orthopedics;  Laterality: Right;   AMPUTATION TOE Right 04/15/2022   Procedure: AMPUTATION  LST TOERIGHT FOOT;  Surgeon: Edwin Cap, DPM;  Location: [facility] ORS;  Service: Podiatry;  Laterality: Right;   APPENDECTOMY  1959   BASCILIC VEIN TRANSPOSITION Right 04/30/2021   Procedure: RIGHT FIRST STAGE BASCILIC VEIN TRANSPOSITION;  Surgeon: Chuck Hint, MD;  Location: [facility] OR;  Service: Vascular;  Laterality: Right;   CESAREAN SECTION  1985   COLONOSCOPY     ESOPHAGOGASTRODUODENOSCOPY (EGD) WITH PROPOFOL N/A 04/18/2021   Procedure: ESOPHAGOGASTRODUODENOSCOPY (EGD) WITH PROPOFOL;  Surgeon: Iva Boop, MD;  Location: [facility] ENDOSCOPY;  Service: Endoscopy;  Laterality: N/A;   EYE SURGERY     bilateral cataract    FLEXIBLE SIGMOIDOSCOPY N/A 04/18/2021   Procedure: FLEXIBLE SIGMOIDOSCOPY;  Surgeon: Iva Boop, MD;  Location: [facility] ENDOSCOPY;  Service: Endoscopy;  Laterality: N/A;   HERNIA REPAIR  1959   IR FLUORO GUIDE CV LINE RIGHT  03/18/2021   IR US GUIDE VASC ACCESS RIGHT  03/18/2021   LEFT HEART CATH AND CORONARY ANGIOGRAPHY N/A 04/04/2017   Procedure: Left Heart Cath and Coronary Angiography;  Surgeon: Lyn Records, MD;  Location: [facility] INVASIVE CV LAB;  Service: Cardiovascular;  Laterality: N/A;   MYOMECTOMY  1980, 2004, 2007   PERIPHERAL VASCULAR THROMBECTOMY  02/21/2022   Procedure: PERIPHERAL VASCULAR THROMBECTOMY;  Surgeon: Maeola Harman, MD;  Location: [facility] INVASIVE CV LAB;  Service: Cardiovascular;;  Right Tibial   RHINOPLASTY  1971   ROTATOR CUFF REPAIR  2003   SURGICAL  REPAIR OF HEMORRHAGE  2015   TONSILLECTOMY  1968   Patient Active Problem List   Diagnosis Date Noted   Hx of right BKA (HCC) 03/10/2023   CHF (congestive heart failure) (HCC) 02/10/2023   Dyspnea 02/09/2023   Acute respiratory distress 02/09/2023   Urinary tract infection without hematuria    Diabetes mellitus due to underlying condition with hypoglycemia without coma, with long-term current use of insulin (HCC)    Seizure disorder (HCC) 06/22/2022   Acute respiratory failure (HCC)    Unresponsiveness    Pressure ulcer 06/07/2022   Acute stroke due to ischemia (HCC) 06/03/2022   Chest pain 06/01/2022   History of CVA (cerebrovascular accident) 06/01/2022   Seizure (HCC)    Encephalopathy    Below-knee amputation of right lower extremity (HCC)    Leukocytosis    Foot infection 05/10/2022   PAD (peripheral artery disease) (HCC) 05/10/2022   Gangrene of right foot (HCC) 04/15/2022   Left hemiparesis (HCC) 09/24/2021   Abnormality of gait 09/24/2021   Pain of right hand 08/06/2021   Steal syndrome of dialysis vascular access (HCC) 08/06/2021   Subclavian steal syndrome of right subclavian artery 06/25/2021   Right hand weakness 06/25/2021   Stroke-like symptoms 05/06/2021   Bandemia    Cerebrovascular accident (CVA) of right basal ganglia (HCC) 04/20/2021   Hemorrhoids    Basal ganglia infarction (HCC) 04/16/2021   ESRD on dialysis (HCC) 04/16/2021   Hypertensive urgency 03/10/2021   Hilar enlargement    Anemia of chronic disease    Chronic diastolic CHF (congestive heart failure) (HCC) 03/09/2021   CKD (chronic kidney disease), stage V (HCC) 03/08/2021   Diabetic retinopathy (HCC) 03/08/2021   Hyperglycemia due to type 2 diabetes mellitus (HCC) 03/08/2021   Pure hypercholesterolemia 03/08/2021   Anemia in chronic kidney disease 09/24/2020   Uncontrolled type 2 diabetes mellitus with hyperglycemia (HCC) 03/19/2019   Osteopenia 09/04/2018   Migraine 09/04/2018   Asthma  09/04/2018   Pseudophakia of both eyes 07/17/2018   Pseudophakia of left eye 07/03/2018   Open angle with borderline findings and high glaucoma risk in left eye 07/03/2018   Age-related nuclear cataract of right eye 07/03/2018   CAD (coronary artery disease), native coronary artery 04/27/2017   Abnormal nuclear stress test 04/04/2017   Shortness of breath 03/09/2017   Appendicitis    Age-related hypermature cataract of both eyes 12/20/2016   Uterine leiomyoma 11/27/2012   Dyslipidemia (high LDL; low HDL) 11/25/2011   Obesity (BMI 30-39.9) 11/25/2011   Hypertensive disorder 11/21/2011   Type 2 diabetes mellitus (HCC) 11/21/2011  Abnormal EKG 11/21/2011    ONSET DATE: 12/29/2022 prosthesis delivery  REFERRING DIAG: W10.272Z (ICD-10-CM) - Below-knee amputation of right lower extremity   THERAPY DIAG:  Unsteadiness on feet  Other abnormalities of gait and mobility  Muscle weakness (generalized)  Hemiplegia and hemiparesis following cerebral infarction affecting left non-dominant side (HCC)  Chronic pain of both knees  Other lack of coordination  Rationale for Evaluation and Treatment: Rehabilitation  SUBJECTIVE:   SUBJECTIVE STATEMENT: She relays she has some pain on distal shin of residual limb, she says it does not hurt at rest but with standing  PERTINENT HISTORY: CVA / left hemiparesis due to right basal ganglia infarct 7/22, right hip bursitis, anxiety, dCHF, ESRD on HD, HTN, DM, CAD  PAIN:  Are you having pain?  Yes: NPRS scale:0/10 sitting & 5 standing  Pain location: Rt knee Pain description: throbbing Aggravating factors: standing, walking Relieving factors: Voltarin & massage  PRECAUTIONS: Fall and Other: NO BP RUE  WEIGHT BEARING RESTRICTIONS: No  FALLS: Has patient fallen in last 6 months? No  LIVING ENVIRONMENT: Lives with: lives with their spouse and CNAs 24 hrs 7 days / wk Lives in: House Home Access: Ramped entrance Home layout: Multi level,  Full bath on main level, and Able to live on main level with bedroom and bathroom Stairs: Yes: Internal: 16 steps; on left going up and External: 4 steps; on right going up Has following equipment at home: Single point cane, Walker - 2 wheeled, Wheelchair (manual), and shower chair  PLOF: Independent with household mobility with device and Independent with community mobility with device  PATIENT GOALS: walk with prosthesis, stand & transfer.   OBJECTIVE:  COGNITION: Overall cognitive status: History of cognitive impairments - at baseline  POSTURE: rounded shoulders, forward head, decreased lumbar lordosis, increased thoracic kyphosis, and flexed trunk   LOWER EXTREMITY ROM: Eval / 01/05/2023:    BLEs PROM WFL except unable to assess hip ext  (Blank rows = not tested)  LOWER EXTREMITY MMT:  MMT Right eval Right 03/06/23  Hip flexion 3-/5 3-/5  Hip extension 3-/5 3-/5  Hip abduction 3-/5 3-/5  Hip adduction    Hip internal rotation    Hip external rotation    Knee flexion 3-/5 3-/5  Knee extension 3-/5 3-/5  Ankle dorsiflexion NA NA  Ankle plantarflexion NA NA  Ankle inversion NA NA  Ankle eversion NA NA  03/06/2023: LLE hemiparesis but able to advance during gait without assistance.   Eval / 01/05/2023: left LE & UE hemiparesis.  In gait slides to advance LLE and knee / hip instability in stance.  (Blank rows = not tested)  TRANSFERS: 03/06/2023:  Sit to stand w/c & 18" chair with armrests to RW with modA. Stand to sit with minA. Stand-pivot  transfer with RW with modA / constant cueing.  02/07/2023: Sit to stand w/c or 18" chair with armrests to RW with modA. Stand to sit with minA. Both require constant cues. Stand-pivot transfer with RW with modA / constant cueing. (2nd person for safety)   01/05/2023 / Eval: Sit to stand: Max A pulling BUEs on //bars from w/c Stand to sit: Min A using BUEs //bars to control descent Lateral scoot transfers: Mod A w/c to / from mat  table level & adjacent   GAIT: 03/06/2023: Pt amb 5' with RW with +2 modA including 90* turn to chair.  02/07/2023: Pt amb 5' with RW with +2 modA including 90* turn to chair.   01/05/2023 /  Eval: Gait pattern: step to pattern, decreased step length- Right, decreased step length- Left, decreased stance time- Right, decreased stance time- Left, decreased hip/knee flexion- Left, decreased ankle dorsiflexion- Left, Right hip hike, Left hip hike, knee flexed in stance- Left, antalgic, lateral hip instability, trunk flexed, wide BOS, and poor foot clearance- Left Distance walked: 3' Assistive device utilized: // bars & RLE TTA prosthesis Level of assistance: Total A / 2 person Comments:  LLE with poor clearance / slides foot forward and knee / hip instability in stance.   FUNCTIONAL TESTs:  03/06/2023: static stance with RW support 60 sec with minA.  Eval / 01/05/2023:  Standing in //bars for 30 sec with modA.    CURRENT PROSTHETIC WEAR ASSESSMENT: 03/06/2023: Patient's husband & CNA correctly don & doff her prosthesis. Pt is dependent requiring maxA to don or doff.  She reports wearing prosthesis 5 hrs 2x/day for 10 hours total on non-dialysis days and up to 8 hours on dialysis days.  Her limb has scab on lateral incision that appears to be internal suture working way out. No signs of infection. Her residual limb appearance is dry skin, normal temperature, limited to no hair growth, cylindrical shape.   Eval / 01/05/2023:  Patient is dependent with: skin check, residual limb care, care of non-amputated limb, prosthetic cleaning, ply sock cleaning, correct ply sock adjustment, proper wear schedule/adjustment, and proper weight-bearing schedule/adjustment Donning prosthesis: Total A / family & CNA requires 100% cueing Doffing prosthesis: Total A / family & CNA requires 100% cueing Prosthetic wear tolerance: 1-1.5 hours, 1x/day, 6 of 7 days since delivery Prosthetic weight bearing tolerance: 3  minutes during eval with c/o knee pain Edema: pitting Residual limb condition: lateral incision with 3mm X 6mm long scabs with serous drainage, arrived with bandaid over wound,  dry skin, normal temperature & color, cylindrical shape Prosthetic description: silicon liner with pin lock suspension, total contact socket, SACH foot K code/activity level with prosthetic use: Level 1    TODAY'S TREATMENT:                                                                                                                             DATE:  03/21/2023: Prosthetic Training with TTA prosthesis Sit to stand with RW and patient pushing off wheelchair with mod assist, requires cues for set up, hand placement, and sequence, X 5 reps total Patient ambulated 10 feet, then 20 feet  with rolling walker and min A once ambulating one extra person for chair follow (CNA). Stand pivot transfers from Wheelchair to standard chair with arms placed 90 deg to simulate toilet transfer. Transferred to chair and back with mod A to Rt and max A to left, with max cues for sequence for foot placement, where to push up from, RW management, and to shift weight fully onto one leg to lift and turn on other leg, then to take steps back until she touches the chair with the back  of her legs before sitting.  03/16/2023: Prosthetic Training with TTA prosthesis Sit to stand with RW and patient pushing off wheelchair with mod assist, requires cues for set up, hand placement, and sequence, X 6 reps total Patient ambulated 10 feet, then 14 feet  with rolling walker and min A once ambulating one extra person for chair follow (CNA). Stand pivot transfers from Wheelchair to standard chair with arms placed 90 deg to simulate toilet transfer. Transferred to chair and back with mod A to Rt and max A to left, with max cues for sequence for foot placement, where to push up from, RW management, and to shift weight fully onto one leg to lift and turn on other  leg, then to take steps back until she touches the chair with the back of her legs before sitting.  03/06/2023: Prosthetic Training with TTA prosthesis: See objective data for reassessment info.   PT recommended increasing wear to all awake hours except 1-2 hours midday.  Begin to wear to dialysis in 2 days.  Pt & husband verbalize understanding.  Pt reports that she takes sponge bath 2x/day.  PT recommended washing & drying area under liner first, then donning prosthesis prior to bathing remaining areas of body to enable prosthesis to assist with sitting balance & limited standing with RW to wash buttocks. Pt & husband verbalized understanding.       HOME EXERCISE PROGRAM:  ASSESSMENT:  CLINICAL IMPRESSION: Shorter session overall as she arrives late. HER SPO2 levels monitored and did not drop below 97% with increased walking today as she performed her best max tolerated distance of 20 feet today.  She will continue to benefit from skilled PT to improve functional abilities.   OBJECTIVE IMPAIRMENTS: Abnormal gait, decreased activity tolerance, decreased balance, decreased cognition, decreased coordination, decreased endurance, decreased knowledge of condition, decreased knowledge of use of DME, decreased mobility, difficulty walking, decreased ROM, decreased strength, postural dysfunction, prosthetic dependency , obesity, and pain.   ACTIVITY LIMITATIONS: standing, stairs, transfers, locomotion level, and safe prosthesis use.  PARTICIPATION LIMITATIONS: community activity, church, and household mobility  PERSONAL FACTORS: Age, Fitness, Past/current experiences, Time since onset of injury/illness/exacerbation, and 3+ comorbidities: see PMH  are also affecting patient's functional outcome.   REHAB POTENTIAL: Good  CLINICAL DECISION MAKING: Evolving/moderate complexity  EVALUATION COMPLEXITY: Moderate   GOALS: Goals reviewed with patient? Yes  SHORT TERM GOALS: Target date:  04/06/2023  Patient reports how to correctly adjust dialysis weight and and weigh out to include prosthesis. Baseline: SEE OBJECTIVE DATA Goal status: Set 03/06/2023 2.  Patient tolerates prosthesis >90% of awake hrs/day without increase in skin issues or limb / knee pain <5/10 with standing. Baseline: SEE OBJECTIVE DATA Goal status:  Set 03/06/2023  3.  Patient stand pivot transfer with RW with minA. Baseline: SEE OBJECTIVE DATA Goal status: Set 03/06/2023  4. Patient ambulates 10' including turning 90* to position to sit with RW with modA of one person.  Baseline: SEE OBJECTIVE DATA Goal status: Set 03/06/2023    LONG TERM GOALS: Target date: 06/01/2023  Patient & family / CNA demonstrates & verbalized understanding of prosthetic care to enable safe utilization of prosthesis. Baseline: SEE OBJECTIVE DATA Goal status: ongoing 03/06/2023  Patient tolerates prosthesis wear >90% of awake hours without skin or limb pain issues and knee pain </= 4/10.  Baseline: SEE OBJECTIVE DATA Goal status:  ongoing 03/06/2023  Stand balance with walker: static 2 min with supervision to enable ability for standing ADLS. Baseline: SEE  OBJECTIVE DATA Goal status: ongoing 03/06/2023  Patient ambulates >50' with RW & prosthesis only with family / CNA assist safely.  Baseline: SEE OBJECTIVE DATA Goal status:  ongoing 03/06/2023  Patient transfers stand pivot including sit to/from stand with RW with minA or less.  Baseline: SEE OBJECTIVE DATA Goal status:  updated 03/06/2023   PLAN:  PT FREQUENCY: 2x/week  PT DURATION: 13 weeks / 90 days  PLANNED INTERVENTIONS: Therapeutic exercises, Therapeutic activity, Neuromuscular re-education, Balance training, Gait training, Patient/Family education, Self Care, Vestibular training, Prosthetic training, DME instructions, and Manual therapy  PLAN FOR NEXT SESSION:  continue gait and transfers including stand pivot with rolling walker, sitting balance  activities.  Referring diagnosis? Z61.096E (ICD-10-CM) - Below-knee amputation of right lower extremity  Treatment diagnosis? (if different than referring diagnosis)  Unsteadiness on feet R26.81   Other abnormalities of gait and mobility R26.89   Muscle weakness (generalized) M62.81   Hemiplegia and hemiparesis following cerebral infarction affecting left non-dominant side (HCC) I69.354   Non-pressure chronic ulcer of skin of other sites with other specified severity [facility]) L98.498   Chronic pain of both knees M25.561, M25.562, G89.29   What was this (referring dx) caused by? [x]  Surgery []  Fall []  Ongoing issue []  Arthritis []  Other: ____________   Laterality: [x]  Rt []  Lt []  Both   Check all possible CPT codes:                   *CHOOSE 10 OR LESS*                                [x]  97110 (Therapeutic Exercise)                   []  92507 (SLP Treatment)      [x]  97112 (Neuro Re-ed)                                []  92526 (Swallowing Treatment)                  [x]  97116 (Gait Training)                                []  [id number] (Cognitive Training, 1st 15 minutes) []  97140 (Manual Therapy)                           []  97130 (Cognitive Training, each add'l 15 minutes)         [x]  97164 (Re-evaluation)                              []  Other, List CPT Code ____________  [x]  97530 (Therapeutic Activities)                                            [x]  97535 (Self Care)               []  All codes above (97110 - 97535)             []  97012 (Mechanical Traction)             []   97014 (E-stim Unattended)             []  97032 (E-stim manual)             []  97033 (Ionto)             []  97035 (Ultrasound) [x]  97750 (Physical Performance Training) []  [id number] (Aquatic Therapy) []  97016 (Vasopneumatic Device) []  [id number] (Paraffin) []  97034 (Contrast Bath) []  [phone] (Wound Care 1st 20 sq cm) []  97598 (Wound Care each add'l 20 sq cm) []  97760 (Orthotic Fabrication, Fitting, Training  Initial) [x]  [id number] (Prosthetic Management and Training Initial) []  [id number] (Orthotic or Prosthetic Training/ Modification Subsequent)      April Manson, PT, DPT 03/21/2023, 3:39 PM

## 2023-03-23 ENCOUNTER — Encounter (INDEPENDENT_AMBULATORY_CARE_PROVIDER_SITE_OTHER): Payer: Medicare PPO | Admitting: Ophthalmology

## 2023-03-23 DIAGNOSIS — E113512 Type 2 diabetes mellitus with proliferative diabetic retinopathy with macular edema, left eye: Secondary | ICD-10-CM

## 2023-03-23 DIAGNOSIS — H35033 Hypertensive retinopathy, bilateral: Secondary | ICD-10-CM

## 2023-03-23 DIAGNOSIS — I10 Essential (primary) hypertension: Secondary | ICD-10-CM

## 2023-03-23 DIAGNOSIS — H4312 Vitreous hemorrhage, left eye: Secondary | ICD-10-CM

## 2023-03-23 DIAGNOSIS — E113591 Type 2 diabetes mellitus with proliferative diabetic retinopathy without macular edema, right eye: Secondary | ICD-10-CM

## 2023-03-23 DIAGNOSIS — Z794 Long term (current) use of insulin: Secondary | ICD-10-CM

## 2023-03-23 DIAGNOSIS — H43813 Vitreous degeneration, bilateral: Secondary | ICD-10-CM

## 2023-03-28 ENCOUNTER — Ambulatory Visit: Payer: Medicare PPO | Admitting: Physical Therapy

## 2023-03-28 ENCOUNTER — Encounter: Payer: Self-pay | Admitting: Physical Therapy

## 2023-03-28 DIAGNOSIS — M25561 Pain in right knee: Secondary | ICD-10-CM

## 2023-03-28 DIAGNOSIS — R2681 Unsteadiness on feet: Secondary | ICD-10-CM

## 2023-03-28 DIAGNOSIS — M6281 Muscle weakness (generalized): Secondary | ICD-10-CM | POA: Diagnosis not present

## 2023-03-28 DIAGNOSIS — I69354 Hemiplegia and hemiparesis following cerebral infarction affecting left non-dominant side: Secondary | ICD-10-CM | POA: Diagnosis not present

## 2023-03-28 DIAGNOSIS — R2689 Other abnormalities of gait and mobility: Secondary | ICD-10-CM

## 2023-03-28 DIAGNOSIS — M25562 Pain in left knee: Secondary | ICD-10-CM

## 2023-03-28 DIAGNOSIS — L98498 Non-pressure chronic ulcer of skin of other sites with other specified severity: Secondary | ICD-10-CM

## 2023-03-28 DIAGNOSIS — G8929 Other chronic pain: Secondary | ICD-10-CM

## 2023-03-28 NOTE — Therapy (Signed)
OUTPATIENT PHYSICAL THERAPY TREATMENT  Patient Name: Carolyn Russo MRN: [id number] DOB:Jun 18, 1951, 72 y.o., female Today's Date: 03/28/2023  PCP: No PCP on file REFERRING PROVIDER: Aldean Baker, MD     END OF SESSION:  PT End of Session - 03/28/23 1627     Visit Number 13    Number of Visits 36    Date for PT Re-Evaluation 06/01/23    Authorization Type [employer] PPO    Authorization Time Period $20 co-pay    Progress Note Due on Visit 20    PT Start Time 1605    PT Stop Time 1645    PT Time Calculation (min) 40 min    Equipment Utilized During Treatment Gait belt    Activity Tolerance Patient tolerated treatment well;Patient limited by fatigue;Patient limited by pain    Behavior During Therapy WFL for tasks assessed/performed                Past Medical History:  Diagnosis Date   Acute GI bleeding    Allergy    Anemia    Anterior chest wall pain    Appendicitis 1965   Asthma    Body mass index 37.0-37.9, adult    Breast pain    Cataract    both eyes   CHF (congestive heart failure) (HCC)    Chronic kidney disease    stage 5 - on dialysis   Cognitive change 04/20/2021   r/t cva 03/2821   Complication of anesthesia    Memory loss after general   Dehydration 2014   Deviated septum 1971   Diabetes mellitus    Dysphagia due to old stroke    easy to get strangled when eating   Dyspnea 2014   Extrinsic asthma    WITH ASTHMA ATTACK   Fibroid 1980   GERD (gastroesophageal reflux disease)    Heart murmur    History of migraine    History of seizure    with stroke   Hx gestational diabetes    Hyperlipidemia    Hypertension 2014   Inguinal hernia 1959   Malaise and fatigue 2014   Non-IgE mediated allergic asthma 2014   Obesity    Pelvic pain    Pregnancy, high-risk 1985   Stroke (HCC) 04/20/2021   (CVA) of right basal ganglia   Tonsillitis 1968   Uterine fibroid 1980   Visual field defect    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   Past Surgical  History:  Procedure Laterality Date   ABDOMINAL AORTOGRAM W/LOWER EXTREMITY N/A 02/21/2022   Procedure: ABDOMINAL AORTOGRAM W/LOWER EXTREMITY;  Surgeon: Maeola Harman, MD;  [facility] INVASIVE CV LAB;  Service: Cardiovascular;  Laterality: N/A;   AMPUTATION Right 05/18/2022   Procedure: RIGHT BELOW KNEE AMPUTATION;  Surgeon: Nadara Mustard, MD;  Location: [facility] OR;  Service: Orthopedics;  Laterality: Right;   AMPUTATION TOE Right 04/15/2022   Procedure: AMPUTATION  LST TOERIGHT FOOT;  Surgeon: Edwin Cap, DPM;  Location: [facility] ORS;  Service: Podiatry;  Laterality: Right;   APPENDECTOMY  1959   BASCILIC VEIN TRANSPOSITION Right 04/30/2021   Procedure: RIGHT FIRST STAGE BASCILIC VEIN TRANSPOSITION;  Surgeon: Chuck Hint, MD;  Location: [facility] OR;  Service: Vascular;  Laterality: Right;   CESAREAN SECTION  1985   COLONOSCOPY     ESOPHAGOGASTRODUODENOSCOPY (EGD) WITH PROPOFOL N/A 04/18/2021   Procedure: ESOPHAGOGASTRODUODENOSCOPY (EGD) WITH PROPOFOL;  Surgeon: Iva Boop, MD;  Location: [facility] ENDOSCOPY;  Service: Endoscopy;  Laterality: N/A;  EYE SURGERY     bilateral cataract    FLEXIBLE SIGMOIDOSCOPY N/A 04/18/2021   Procedure: FLEXIBLE SIGMOIDOSCOPY;  Surgeon: Iva Boop, MD;  Location: [facility] ENDOSCOPY;  Service: Endoscopy;  Laterality: N/A;   HERNIA REPAIR  1959   IR FLUORO GUIDE CV LINE RIGHT  03/18/2021   IR US GUIDE VASC ACCESS RIGHT  03/18/2021   LEFT HEART CATH AND CORONARY ANGIOGRAPHY N/A 04/04/2017   Procedure: Left Heart Cath and Coronary Angiography;  Surgeon: Lyn Records, MD;  Location: [facility] INVASIVE CV LAB;  Service: Cardiovascular;  Laterality: N/A;   MYOMECTOMY  1980, 2004, 2007   PERIPHERAL VASCULAR THROMBECTOMY  02/21/2022   Procedure: PERIPHERAL VASCULAR THROMBECTOMY;  Surgeon: Maeola Harman, MD;  Location: [facility] INVASIVE CV LAB;  Service: Cardiovascular;;  Right Tibial   RHINOPLASTY  1971   ROTATOR CUFF REPAIR  2003   SURGICAL REPAIR OF HEMORRHAGE   2015   TONSILLECTOMY  1968   Patient Active Problem List   Diagnosis Date Noted   Hx of right BKA (HCC) 03/10/2023   CHF (congestive heart failure) (HCC) 02/10/2023   Dyspnea 02/09/2023   Acute respiratory distress 02/09/2023   Urinary tract infection without hematuria    Diabetes mellitus due to underlying condition with hypoglycemia without coma, with long-term current use of insulin (HCC)    Seizure disorder (HCC) 06/22/2022   Acute respiratory failure (HCC)    Unresponsiveness    Pressure ulcer 06/07/2022   Acute stroke due to ischemia (HCC) 06/03/2022   Chest pain 06/01/2022   History of CVA (cerebrovascular accident) 06/01/2022   Seizure (HCC)    Encephalopathy    Below-knee amputation of right lower extremity (HCC)    Leukocytosis    Foot infection 05/10/2022   PAD (peripheral artery disease) (HCC) 05/10/2022   Gangrene of right foot (HCC) 04/15/2022   Left hemiparesis (HCC) 09/24/2021   Abnormality of gait 09/24/2021   Pain of right hand 08/06/2021   Steal syndrome of dialysis vascular access (HCC) 08/06/2021   Subclavian steal syndrome of right subclavian artery 06/25/2021   Right hand weakness 06/25/2021   Stroke-like symptoms 05/06/2021   Bandemia    Cerebrovascular accident (CVA) of right basal ganglia (HCC) 04/20/2021   Hemorrhoids    Basal ganglia infarction (HCC) 04/16/2021   ESRD on dialysis (HCC) 04/16/2021   Hypertensive urgency 03/10/2021   Hilar enlargement    Anemia of chronic disease    Chronic diastolic CHF (congestive heart failure) (HCC) 03/09/2021   CKD (chronic kidney disease), stage V (HCC) 03/08/2021   Diabetic retinopathy (HCC) 03/08/2021   Hyperglycemia due to type 2 diabetes mellitus (HCC) 03/08/2021   Pure hypercholesterolemia 03/08/2021   Anemia in chronic kidney disease 09/24/2020   Uncontrolled type 2 diabetes mellitus with hyperglycemia (HCC) 03/19/2019   Osteopenia 09/04/2018   Migraine 09/04/2018   Asthma 09/04/2018    Pseudophakia of both eyes 07/17/2018   Pseudophakia of left eye 07/03/2018   Open angle with borderline findings and high glaucoma risk in left eye 07/03/2018   Age-related nuclear cataract of right eye 07/03/2018   CAD (coronary artery disease), native coronary artery 04/27/2017   Abnormal nuclear stress test 04/04/2017   Shortness of breath 03/09/2017   Appendicitis    Age-related hypermature cataract of both eyes 12/20/2016   Uterine leiomyoma 11/27/2012   Dyslipidemia (high LDL; low HDL) 11/25/2011   Obesity (BMI 30-39.9) 11/25/2011   Hypertensive disorder 11/21/2011   Type 2 diabetes mellitus (HCC) 11/21/2011   Abnormal EKG  11/21/2011    ONSET DATE: 12/29/2022 prosthesis delivery  REFERRING DIAG: Z61.096E (ICD-10-CM) - Below-knee amputation of right lower extremity   THERAPY DIAG:  Unsteadiness on feet  Other abnormalities of gait and mobility  Muscle weakness (generalized)  Hemiplegia and hemiparesis following cerebral infarction affecting left non-dominant side (HCC)  Chronic pain of both knees  Non-pressure chronic ulcer of skin of other sites with other specified severity (HCC)  Rationale for Evaluation and Treatment: Rehabilitation  SUBJECTIVE:   SUBJECTIVE STATEMENT: She denies pain at rest upon arrival, does complain of some hip/back pain and leg pain with walking/transfers  PERTINENT HISTORY: CVA / left hemiparesis due to right basal ganglia infarct 7/22, right hip bursitis, anxiety, dCHF, ESRD on HD, HTN, DM, CAD  PAIN:  Are you having pain?  Yes: NPRS scale:0/10 sitting & 5 standing  Pain location: Rt knee Pain description: throbbing Aggravating factors: standing, walking Relieving factors: Voltarin & massage  PRECAUTIONS: Fall and Other: NO BP RUE  WEIGHT BEARING RESTRICTIONS: No  FALLS: Has patient fallen in last 6 months? No  LIVING ENVIRONMENT: Lives with: lives with their spouse and CNAs 24 hrs 7 days / wk Lives in: House Home Access:  Ramped entrance Home layout: Multi level, Full bath on main level, and Able to live on main level with bedroom and bathroom Stairs: Yes: Internal: 16 steps; on left going up and External: 4 steps; on right going up Has following equipment at home: Single point cane, Walker - 2 wheeled, Wheelchair (manual), and shower chair  PLOF: Independent with household mobility with device and Independent with community mobility with device  PATIENT GOALS: walk with prosthesis, stand & transfer.   OBJECTIVE:  COGNITION: Overall cognitive status: History of cognitive impairments - at baseline  POSTURE: rounded shoulders, forward head, decreased lumbar lordosis, increased thoracic kyphosis, and flexed trunk   LOWER EXTREMITY ROM: Eval / 01/05/2023:    BLEs PROM WFL except unable to assess hip ext  (Blank rows = not tested)  LOWER EXTREMITY MMT:  MMT Right eval Right 03/06/23  Hip flexion 3-/5 3-/5  Hip extension 3-/5 3-/5  Hip abduction 3-/5 3-/5  Hip adduction    Hip internal rotation    Hip external rotation    Knee flexion 3-/5 3-/5  Knee extension 3-/5 3-/5  Ankle dorsiflexion NA NA  Ankle plantarflexion NA NA  Ankle inversion NA NA  Ankle eversion NA NA  03/06/2023: LLE hemiparesis but able to advance during gait without assistance.   Eval / 01/05/2023: left LE & UE hemiparesis.  In gait slides to advance LLE and knee / hip instability in stance.  (Blank rows = not tested)  TRANSFERS: 03/06/2023:  Sit to stand w/c & 18" chair with armrests to RW with modA. Stand to sit with minA. Stand-pivot  transfer with RW with modA / constant cueing.  02/07/2023: Sit to stand w/c or 18" chair with armrests to RW with modA. Stand to sit with minA. Both require constant cues. Stand-pivot transfer with RW with modA / constant cueing. (2nd person for safety)   01/05/2023 / Eval: Sit to stand: Max A pulling BUEs on //bars from w/c Stand to sit: Min A using BUEs //bars to control descent Lateral  scoot transfers: Mod A w/c to / from mat table level & adjacent   GAIT: 03/06/2023: Pt amb 5' with RW with +2 modA including 90* turn to chair.  02/07/2023: Pt amb 5' with RW with +2 modA including 90* turn to chair.  01/05/2023 / Eval: Gait pattern: step to pattern, decreased step length- Right, decreased step length- Left, decreased stance time- Right, decreased stance time- Left, decreased hip/knee flexion- Left, decreased ankle dorsiflexion- Left, Right hip hike, Left hip hike, knee flexed in stance- Left, antalgic, lateral hip instability, trunk flexed, wide BOS, and poor foot clearance- Left Distance walked: 3' Assistive device utilized: // bars & RLE TTA prosthesis Level of assistance: Total A / 2 person Comments:  LLE with poor clearance / slides foot forward and knee / hip instability in stance.   FUNCTIONAL TESTs:  03/06/2023: static stance with RW support 60 sec with minA.  Eval / 01/05/2023:  Standing in //bars for 30 sec with modA.    CURRENT PROSTHETIC WEAR ASSESSMENT: 03/06/2023: Patient's husband & CNA correctly don & doff her prosthesis. Pt is dependent requiring maxA to don or doff.  She reports wearing prosthesis 5 hrs 2x/day for 10 hours total on non-dialysis days and up to 8 hours on dialysis days.  Her limb has scab on lateral incision that appears to be internal suture working way out. No signs of infection. Her residual limb appearance is dry skin, normal temperature, limited to no hair growth, cylindrical shape.   Eval / 01/05/2023:  Patient is dependent with: skin check, residual limb care, care of non-amputated limb, prosthetic cleaning, ply sock cleaning, correct ply sock adjustment, proper wear schedule/adjustment, and proper weight-bearing schedule/adjustment Donning prosthesis: Total A / family & CNA requires 100% cueing Doffing prosthesis: Total A / family & CNA requires 100% cueing Prosthetic wear tolerance: 1-1.5 hours, 1x/day, 6 of 7 days since  delivery Prosthetic weight bearing tolerance: 3 minutes during eval with c/o knee pain Edema: pitting Residual limb condition: lateral incision with 3mm X 6mm long scabs with serous drainage, arrived with bandaid over wound,  dry skin, normal temperature & color, cylindrical shape Prosthetic description: silicon liner with pin lock suspension, total contact socket, SACH foot K code/activity level with prosthetic use: Level 1    TODAY'S TREATMENT:                                                                                                                             DATE:  03/21/2023: Prosthetic Training with TTA prosthesis Sit to stand with RW and patient pushing off wheelchair with mod assist, requires cues for set up, hand placement, and sequence, X 5 reps total Patient ambulated 10 feet, then 25 feet  with rolling walker and min A once ambulating one extra person for chair follow (CNA). Stand pivot transfers from Wheelchair to  leg press with RW and mod A overall for turning, and RW management and then once sitting on leg press chair, max A for management of placing legs on leg press platform moving from sittting to supine at 45 deg incline. Leg press DL 16# 1W96, then single leg 25# 2X10 each side with manual assistance at left knee to reduce knee varus/hip IR Max A  to transfer from leg press supine at 45 deg to sitting on edge of leg press. Then mod A to transfer from leg press to standard chair at 90 deg.  Patient ambulated another 25 feet, then 20 feet  with rolling walker and min A once ambulating one extra person for chair follow (CNA).  03/21/2023: Prosthetic Training with TTA prosthesis Sit to stand with RW and patient pushing off wheelchair with mod assist, requires cues for set up, hand placement, and sequence, X 5 reps total Patient ambulated 10 feet, then 20 feet  with rolling walker and min A once ambulating one extra person for chair follow (CNA). Stand pivot transfers from  Wheelchair to standard chair with arms placed 90 deg to simulate toilet transfer. Transferred to chair and back with mod A to Rt and max A to left, with max cues for sequence for foot placement, where to push up from, RW management, and to shift weight fully onto one leg to lift and turn on other leg, then to take steps back until she touches the chair with the back of her legs before sitting.  03/16/2023: Prosthetic Training with TTA prosthesis Sit to stand with RW and patient pushing off wheelchair with mod assist, requires cues for set up, hand placement, and sequence, X 6 reps total Patient ambulated 10 feet, then 14 feet  with rolling walker and min A once ambulating one extra person for chair follow (CNA). Stand pivot transfers from Wheelchair to standard chair with arms placed 90 deg to simulate toilet transfer. Transferred to chair and back with mod A to Rt and max A to left, with max cues for sequence for foot placement, where to push up from, RW management, and to shift weight fully onto one leg to lift and turn on other leg, then to take steps back until she touches the chair with the back of her legs before sitting.  03/06/2023: Prosthetic Training with TTA prosthesis: See objective data for reassessment info.   PT recommended increasing wear to all awake hours except 1-2 hours midday.  Begin to wear to dialysis in 2 days.  Pt & husband verbalize understanding.  Pt reports that she takes sponge bath 2x/day.  PT recommended washing & drying area under liner first, then donning prosthesis prior to bathing remaining areas of body to enable prosthesis to assist with sitting balance & limited standing with RW to wash buttocks. Pt & husband verbalized understanding.       HOME EXERCISE PROGRAM:  ASSESSMENT:  CLINICAL IMPRESSION: She had her best session yet in terms of ambulation tolerance and distance walked overall. We also added in leg press today for leg strengthening and transfer  training. PT will continue to work to progress her functional abilities as tolerated.   OBJECTIVE IMPAIRMENTS: Abnormal gait, decreased activity tolerance, decreased balance, decreased cognition, decreased coordination, decreased endurance, decreased knowledge of condition, decreased knowledge of use of DME, decreased mobility, difficulty walking, decreased ROM, decreased strength, postural dysfunction, prosthetic dependency , obesity, and pain.   ACTIVITY LIMITATIONS: standing, stairs, transfers, locomotion level, and safe prosthesis use.  PARTICIPATION LIMITATIONS: community activity, church, and household mobility  PERSONAL FACTORS: Age, Fitness, Past/current experiences, Time since onset of injury/illness/exacerbation, and 3+ comorbidities: see PMH  are also affecting patient's functional outcome.   REHAB POTENTIAL: Good  CLINICAL DECISION MAKING: Evolving/moderate complexity  EVALUATION COMPLEXITY: Moderate   GOALS: Goals reviewed with patient? Yes  SHORT TERM GOALS: Target date: 04/06/2023  Patient reports how to correctly  adjust dialysis weight and and weigh out to include prosthesis. Baseline: SEE OBJECTIVE DATA Goal status: Set 03/06/2023 2.  Patient tolerates prosthesis >90% of awake hrs/day without increase in skin issues or limb / knee pain <5/10 with standing. Baseline: SEE OBJECTIVE DATA Goal status:  Set 03/06/2023  3.  Patient stand pivot transfer with RW with minA. Baseline: SEE OBJECTIVE DATA Goal status: Set 03/06/2023  4. Patient ambulates 10' including turning 90* to position to sit with RW with modA of one person.  Baseline: SEE OBJECTIVE DATA Goal status: Set 03/06/2023    LONG TERM GOALS: Target date: 06/01/2023  Patient & family / CNA demonstrates & verbalized understanding of prosthetic care to enable safe utilization of prosthesis. Baseline: SEE OBJECTIVE DATA Goal status: ongoing 03/06/2023  Patient tolerates prosthesis wear >90% of awake hours without  skin or limb pain issues and knee pain </= 4/10.  Baseline: SEE OBJECTIVE DATA Goal status:  ongoing 03/06/2023  Stand balance with walker: static 2 min with supervision to enable ability for standing ADLS. Baseline: SEE OBJECTIVE DATA Goal status: ongoing 03/06/2023  Patient ambulates >50' with RW & prosthesis only with family / CNA assist safely.  Baseline: SEE OBJECTIVE DATA Goal status:  ongoing 03/06/2023  Patient transfers stand pivot including sit to/from stand with RW with minA or less.  Baseline: SEE OBJECTIVE DATA Goal status:  updated 03/06/2023   PLAN:  PT FREQUENCY: 2x/week  PT DURATION: 13 weeks / 90 days  PLANNED INTERVENTIONS: Therapeutic exercises, Therapeutic activity, Neuromuscular re-education, Balance training, Gait training, Patient/Family education, Self Care, Vestibular training, Prosthetic training, DME instructions, and Manual therapy  PLAN FOR NEXT SESSION:  continue gait and transfers including stand pivot with rolling walker, leg strength, balance  Referring diagnosis? Z61.096E (ICD-10-CM) - Below-knee amputation of right lower extremity  Treatment diagnosis? (if different than referring diagnosis)  Unsteadiness on feet R26.81   Other abnormalities of gait and mobility R26.89   Muscle weakness (generalized) M62.81   Hemiplegia and hemiparesis following cerebral infarction affecting left non-dominant side (HCC) I69.354   Non-pressure chronic ulcer of skin of other sites with other specified severity [facility]) L98.498   Chronic pain of both knees M25.561, M25.562, G89.29   What was this (referring dx) caused by? [x]  Surgery []  Fall []  Ongoing issue []  Arthritis []  Other: ____________   Laterality: [x]  Rt []  Lt []  Both   Check all possible CPT codes:                   *CHOOSE 10 OR LESS*                                [x]  97110 (Therapeutic Exercise)                   []  45409 (SLP Treatment)      [x]  97112 (Neuro Re-ed)                                 []  92526 (Swallowing Treatment)                  [x]  97116 (Gait Training)                                []  [id number] (Cognitive Training, 1st 15 minutes) []  97140 (Manual  Therapy)                           []  97130 (Cognitive Training, each add'l 15 minutes)         [x]  97164 (Re-evaluation)                              []  Other, List CPT Code ____________  [x]  97530 (Therapeutic Activities)                                            [x]  97535 (Self Care)               []  All codes above (97110 - 97535)             []  97012 (Mechanical Traction)             []  97014 (E-stim Unattended)             []  97032 (E-stim manual)             []  97033 (Ionto)             []  97035 (Ultrasound) [x]  97750 (Physical Performance Training) []  [id number] (Aquatic Therapy) []  97016 (Vasopneumatic Device) []  [id number] (Paraffin) []  97034 (Contrast Bath) []  97597 (Wound Care 1st 20 sq cm) []  97598 (Wound Care each add'l 20 sq cm) []  97760 (Orthotic Fabrication, Fitting, Training Initial) [x]  [id number] (Prosthetic Management and Training Initial) []  [id number] (Orthotic or Prosthetic Training/ Modification Subsequent)      April Manson, PT, DPT 03/28/2023, 4:32 PM

## 2023-03-30 ENCOUNTER — Ambulatory Visit: Payer: Medicare PPO | Admitting: Physical Therapy

## 2023-03-30 ENCOUNTER — Encounter: Payer: Self-pay | Admitting: Physical Therapy

## 2023-03-30 DIAGNOSIS — M6281 Muscle weakness (generalized): Secondary | ICD-10-CM

## 2023-03-30 DIAGNOSIS — R2689 Other abnormalities of gait and mobility: Secondary | ICD-10-CM | POA: Diagnosis not present

## 2023-03-30 DIAGNOSIS — R2681 Unsteadiness on feet: Secondary | ICD-10-CM

## 2023-03-30 DIAGNOSIS — I69354 Hemiplegia and hemiparesis following cerebral infarction affecting left non-dominant side: Secondary | ICD-10-CM | POA: Diagnosis not present

## 2023-03-30 DIAGNOSIS — M25561 Pain in right knee: Secondary | ICD-10-CM

## 2023-03-30 DIAGNOSIS — M25562 Pain in left knee: Secondary | ICD-10-CM

## 2023-03-30 DIAGNOSIS — G8929 Other chronic pain: Secondary | ICD-10-CM

## 2023-03-30 NOTE — Therapy (Signed)
OUTPATIENT PHYSICAL THERAPY TREATMENT  Patient Name: Carolyn Russo MRN: [id number] DOB:1951-09-19, 72 y.o., female Today's Date: 03/30/2023  PCP: No PCP on file REFERRING PROVIDER: Aldean Baker, MD     END OF SESSION:  PT End of Session - 03/30/23 1635     Visit Number 14    Number of Visits 36    Date for PT Re-Evaluation 06/01/23    Authorization Type [employer] PPO    Authorization Time Period $20 co-pay    Progress Note Due on Visit 20    PT Start Time 1603    PT Stop Time 1643    PT Time Calculation (min) 40 min    Equipment Utilized During Treatment Gait belt    Activity Tolerance Patient tolerated treatment well;Patient limited by fatigue;Patient limited by pain    Behavior During Therapy WFL for tasks assessed/performed                Past Medical History:  Diagnosis Date   Acute GI bleeding    Allergy    Anemia    Anterior chest wall pain    Appendicitis 1965   Asthma    Body mass index 37.0-37.9, adult    Breast pain    Cataract    both eyes   CHF (congestive heart failure) (HCC)    Chronic kidney disease    stage 5 - on dialysis   Cognitive change 04/20/2021   r/t cva 03/2821   Complication of anesthesia    Memory loss after general   Dehydration 2014   Deviated septum 1971   Diabetes mellitus    Dysphagia due to old stroke    easy to get strangled when eating   Dyspnea 2014   Extrinsic asthma    WITH ASTHMA ATTACK   Fibroid 1980   GERD (gastroesophageal reflux disease)    Heart murmur    History of migraine    History of seizure    with stroke   Hx gestational diabetes    Hyperlipidemia    Hypertension 2014   Inguinal hernia 1959   Malaise and fatigue 2014   Non-IgE mediated allergic asthma 2014   Obesity    Pelvic pain    Pregnancy, high-risk 1985   Stroke (HCC) 04/20/2021   (CVA) of right basal ganglia   Tonsillitis 1968   Uterine fibroid 1980   Visual field defect    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   Past Surgical  History:  Procedure Laterality Date   ABDOMINAL AORTOGRAM W/LOWER EXTREMITY N/A 02/21/2022   Procedure: ABDOMINAL AORTOGRAM W/LOWER EXTREMITY;  Surgeon: Maeola Harman, MD;  Location: [facility] INVASIVE CV LAB;  Service: Cardiovascular;  Laterality: N/A;   AMPUTATION Right 05/18/2022   Procedure: RIGHT BELOW KNEE AMPUTATION;  Surgeon: Nadara Mustard, MD;  Location: [facility] OR;  Service: Orthopedics;  Laterality: Right;   AMPUTATION TOE Right 04/15/2022   Procedure: AMPUTATION  LST TOERIGHT FOOT;  Surgeon: Edwin Cap, DPM;  Location: [facility] ORS;  Service: Podiatry;  Laterality: Right;   APPENDECTOMY  1959   BASCILIC VEIN TRANSPOSITION Right 04/30/2021   Procedure: RIGHT FIRST STAGE BASCILIC VEIN TRANSPOSITION;  Surgeon: Chuck Hint, MD;  Location: [facility] OR;  Service: Vascular;  Laterality: Right;   CESAREAN SECTION  1985   COLONOSCOPY     ESOPHAGOGASTRODUODENOSCOPY (EGD) WITH PROPOFOL N/A 04/18/2021   Procedure: ESOPHAGOGASTRODUODENOSCOPY (EGD) WITH PROPOFOL;  Surgeon: Iva Boop, MD;  Location: [facility] ENDOSCOPY;  Service: Endoscopy;  Laterality: N/A;  EYE SURGERY     bilateral cataract    FLEXIBLE SIGMOIDOSCOPY N/A 04/18/2021   Procedure: FLEXIBLE SIGMOIDOSCOPY;  Surgeon: Iva Boop, MD;  Location: [facility] ENDOSCOPY;  Service: Endoscopy;  Laterality: N/A;   HERNIA REPAIR  1959   IR FLUORO GUIDE CV LINE RIGHT  03/18/2021   IR US GUIDE VASC ACCESS RIGHT  03/18/2021   LEFT HEART CATH AND CORONARY ANGIOGRAPHY N/A 04/04/2017   Procedure: Left Heart Cath and Coronary Angiography;  Surgeon: Lyn Records, MD;  Location: [facility] INVASIVE CV LAB;  Service: Cardiovascular;  Laterality: N/A;   MYOMECTOMY  1980, 2004, 2007   PERIPHERAL VASCULAR THROMBECTOMY  02/21/2022   Procedure: PERIPHERAL VASCULAR THROMBECTOMY;  Surgeon: Maeola Harman, MD;  Location: [facility] INVASIVE CV LAB;  Service: Cardiovascular;;  Right Tibial   RHINOPLASTY  1971   ROTATOR CUFF REPAIR  2003   SURGICAL REPAIR OF HEMORRHAGE   2015   TONSILLECTOMY  1968   Patient Active Problem List   Diagnosis Date Noted   Hx of right BKA (HCC) 03/10/2023   CHF (congestive heart failure) (HCC) 02/10/2023   Dyspnea 02/09/2023   Acute respiratory distress 02/09/2023   Urinary tract infection without hematuria    Diabetes mellitus due to underlying condition with hypoglycemia without coma, with long-term current use of insulin (HCC)    Seizure disorder (HCC) 06/22/2022   Acute respiratory failure (HCC)    Unresponsiveness    Pressure ulcer 06/07/2022   Acute stroke due to ischemia (HCC) 06/03/2022   Chest pain 06/01/2022   History of CVA (cerebrovascular accident) 06/01/2022   Seizure (HCC)    Encephalopathy    Below-knee amputation of right lower extremity (HCC)    Leukocytosis    Foot infection 05/10/2022   PAD (peripheral artery disease) (HCC) 05/10/2022   Gangrene of right foot (HCC) 04/15/2022   Left hemiparesis (HCC) 09/24/2021   Abnormality of gait 09/24/2021   Pain of right hand 08/06/2021   Steal syndrome of dialysis vascular access (HCC) 08/06/2021   Subclavian steal syndrome of right subclavian artery 06/25/2021   Right hand weakness 06/25/2021   Stroke-like symptoms 05/06/2021   Bandemia    Cerebrovascular accident (CVA) of right basal ganglia (HCC) 04/20/2021   Hemorrhoids    Basal ganglia infarction (HCC) 04/16/2021   ESRD on dialysis (HCC) 04/16/2021   Hypertensive urgency 03/10/2021   Hilar enlargement    Anemia of chronic disease    Chronic diastolic CHF (congestive heart failure) (HCC) 03/09/2021   CKD (chronic kidney disease), stage V (HCC) 03/08/2021   Diabetic retinopathy (HCC) 03/08/2021   Hyperglycemia due to type 2 diabetes mellitus (HCC) 03/08/2021   Pure hypercholesterolemia 03/08/2021   Anemia in chronic kidney disease 09/24/2020   Uncontrolled type 2 diabetes mellitus with hyperglycemia (HCC) 03/19/2019   Osteopenia 09/04/2018   Migraine 09/04/2018   Asthma 09/04/2018    Pseudophakia of both eyes 07/17/2018   Pseudophakia of left eye 07/03/2018   Open angle with borderline findings and high glaucoma risk in left eye 07/03/2018   Age-related nuclear cataract of right eye 07/03/2018   CAD (coronary artery disease), native coronary artery 04/27/2017   Abnormal nuclear stress test 04/04/2017   Shortness of breath 03/09/2017   Appendicitis    Age-related hypermature cataract of both eyes 12/20/2016   Uterine leiomyoma 11/27/2012   Dyslipidemia (high LDL; low HDL) 11/25/2011   Obesity (BMI 30-39.9) 11/25/2011   Hypertensive disorder 11/21/2011   Type 2 diabetes mellitus (HCC) 11/21/2011   Abnormal EKG  11/21/2011    ONSET DATE: 12/29/2022 prosthesis delivery  REFERRING DIAG: V78.469G (ICD-10-CM) - Below-knee amputation of right lower extremity   THERAPY DIAG:  Unsteadiness on feet  Other abnormalities of gait and mobility  Muscle weakness (generalized)  Hemiplegia and hemiparesis following cerebral infarction affecting left non-dominant side (HCC)  Chronic pain of both knees  Rationale for Evaluation and Treatment: Rehabilitation  SUBJECTIVE:   SUBJECTIVE STATEMENT: She denies pain at rest upon arrival but more overall Rt leg pain complaints during walking compared to last time.   PERTINENT HISTORY: CVA / left hemiparesis due to right basal ganglia infarct 7/22, right hip bursitis, anxiety, dCHF, ESRD on HD, HTN, DM, CAD  PAIN:  Are you having pain?  Yes: NPRS scale:0/10 sitting & 5 standing  Pain location: Rt knee Pain description: throbbing Aggravating factors: standing, walking Relieving factors: Voltarin & massage  PRECAUTIONS: Fall and Other: NO BP RUE  WEIGHT BEARING RESTRICTIONS: No  FALLS: Has patient fallen in last 6 months? No  LIVING ENVIRONMENT: Lives with: lives with their spouse and CNAs 24 hrs 7 days / wk Lives in: House Home Access: Ramped entrance Home layout: Multi level, Full bath on main level, and Able to live on  main level with bedroom and bathroom Stairs: Yes: Internal: 16 steps; on left going up and External: 4 steps; on right going up Has following equipment at home: Single point cane, Walker - 2 wheeled, Wheelchair (manual), and shower chair  PLOF: Independent with household mobility with device and Independent with community mobility with device  PATIENT GOALS: walk with prosthesis, stand & transfer.   OBJECTIVE:  COGNITION: Overall cognitive status: History of cognitive impairments - at baseline  POSTURE: rounded shoulders, forward head, decreased lumbar lordosis, increased thoracic kyphosis, and flexed trunk   LOWER EXTREMITY ROM: Eval / 01/05/2023:    BLEs PROM WFL except unable to assess hip ext  (Blank rows = not tested)  LOWER EXTREMITY MMT:  MMT Right eval Right 03/06/23  Hip flexion 3-/5 3-/5  Hip extension 3-/5 3-/5  Hip abduction 3-/5 3-/5  Hip adduction    Hip internal rotation    Hip external rotation    Knee flexion 3-/5 3-/5  Knee extension 3-/5 3-/5  Ankle dorsiflexion NA NA  Ankle plantarflexion NA NA  Ankle inversion NA NA  Ankle eversion NA NA  03/06/2023: LLE hemiparesis but able to advance during gait without assistance.   Eval / 01/05/2023: left LE & UE hemiparesis.  In gait slides to advance LLE and knee / hip instability in stance.  (Blank rows = not tested)  TRANSFERS: 03/06/2023:  Sit to stand w/c & 18" chair with armrests to RW with modA. Stand to sit with minA. Stand-pivot  transfer with RW with modA / constant cueing.  02/07/2023: Sit to stand w/c or 18" chair with armrests to RW with modA. Stand to sit with minA. Both require constant cues. Stand-pivot transfer with RW with modA / constant cueing. (2nd person for safety)   01/05/2023 / Eval: Sit to stand: Max A pulling BUEs on //bars from w/c Stand to sit: Min A using BUEs //bars to control descent Lateral scoot transfers: Mod A w/c to / from mat table level & adjacent   GAIT: 03/06/2023: Pt  amb 5' with RW with +2 modA including 90* turn to chair.  02/07/2023: Pt amb 5' with RW with +2 modA including 90* turn to chair.   01/05/2023 / Eval: Gait pattern: step to pattern, decreased step  length- Right, decreased step length- Left, decreased stance time- Right, decreased stance time- Left, decreased hip/knee flexion- Left, decreased ankle dorsiflexion- Left, Right hip hike, Left hip hike, knee flexed in stance- Left, antalgic, lateral hip instability, trunk flexed, wide BOS, and poor foot clearance- Left Distance walked: 3' Assistive device utilized: // bars & RLE TTA prosthesis Level of assistance: Total A / 2 person Comments:  LLE with poor clearance / slides foot forward and knee / hip instability in stance.   FUNCTIONAL TESTs:  03/06/2023: static stance with RW support 60 sec with minA.  Eval / 01/05/2023:  Standing in //bars for 30 sec with modA.    CURRENT PROSTHETIC WEAR ASSESSMENT: 03/06/2023: Patient's husband & CNA correctly don & doff her prosthesis. Pt is dependent requiring maxA to don or doff.  She reports wearing prosthesis 5 hrs 2x/day for 10 hours total on non-dialysis days and up to 8 hours on dialysis days.  Her limb has scab on lateral incision that appears to be internal suture working way out. No signs of infection. Her residual limb appearance is dry skin, normal temperature, limited to no hair growth, cylindrical shape.   Eval / 01/05/2023:  Patient is dependent with: skin check, residual limb care, care of non-amputated limb, prosthetic cleaning, ply sock cleaning, correct ply sock adjustment, proper wear schedule/adjustment, and proper weight-bearing schedule/adjustment Donning prosthesis: Total A / family & CNA requires 100% cueing Doffing prosthesis: Total A / family & CNA requires 100% cueing Prosthetic wear tolerance: 1-1.5 hours, 1x/day, 6 of 7 days since delivery Prosthetic weight bearing tolerance: 3 minutes during eval with c/o knee pain Edema:  pitting Residual limb condition: lateral incision with 3mm X 6mm long scabs with serous drainage, arrived with bandaid over wound,  dry skin, normal temperature & color, cylindrical shape Prosthetic description: silicon liner with pin lock suspension, total contact socket, SACH foot K code/activity level with prosthetic use: Level 1    TODAY'S TREATMENT:                                                                                                                             DATE:  03/30/23 Prosthetic Training with TTA prosthesis Sit to stand with RW and patient pushing off wheelchair with mod assist, requires cues for set up, hand placement, and sequence, X 6 reps total Patient ambulated 10 feet, then 25 feet  with rolling walker and min A once ambulating one extra person for chair follow (CNA). Stand pivot transfers from Wheelchair to leg press with RW and mod A overall for turning, and RW management and then once sitting on leg press chair, max A for management of placing legs on leg press platform moving from sittting to supine at 45 deg incline. Leg press DL 16# [id number], then single leg 25# 2X10 each side with manual assistance at left knee to reduce knee varus/hip IR Max A to transfer from leg press supine at 45 deg to sitting  on edge of leg press. Then mod A to transfer from leg press to wheel chair at 90 deg.  Patient ambulated another 20 feet, then 25 feet with rolling walker and min A from husband while PT was CGA just in case and one extra person for chair follow (CNA).  03/28/23 Prosthetic Training with TTA prosthesis Sit to stand with RW and patient pushing off wheelchair with mod assist, requires cues for set up, hand placement, and sequence, X 5 reps total Patient ambulated 10 feet, then 25 feet  with rolling walker and min A once ambulating one extra person for chair follow (CNA). Stand pivot transfers from Wheelchair to  leg press with RW and mod A overall for turning, and RW  management and then once sitting on leg press chair, max A for management of placing legs on leg press platform moving from sittting to supine at 45 deg incline. Leg press DL 11# [id number], then single leg 25# 2X10 each side with manual assistance at left knee to reduce knee varus/hip IR Max A to transfer from leg press supine at 45 deg to sitting on edge of leg press. Then mod A to transfer from leg press to standard chair at 90 deg.  Patient ambulated another 25 feet, then 20 feet  with rolling walker and min A once ambulating one extra person for chair follow (CNA).  03/21/2023: Prosthetic Training with TTA prosthesis Sit to stand with RW and patient pushing off wheelchair with mod assist, requires cues for set up, hand placement, and sequence, X 5 reps total Patient ambulated 10 feet, then 20 feet  with rolling walker and min A once ambulating one extra person for chair follow (CNA). Stand pivot transfers from Wheelchair to standard chair with arms placed 90 deg to simulate toilet transfer. Transferred to chair and back with mod A to Rt and max A to left, with max cues for sequence for foot placement, where to push up from, RW management, and to shift weight fully onto one leg to lift and turn on other leg, then to take steps back until she touches the chair with the back of her legs before sitting.  03/16/2023: Prosthetic Training with TTA prosthesis Sit to stand with RW and patient pushing off wheelchair with mod assist, requires cues for set up, hand placement, and sequence, X 6 reps total Patient ambulated 10 feet, then 14 feet  with rolling walker and min A once ambulating one extra person for chair follow (CNA). Stand pivot transfers from Wheelchair to standard chair with arms placed 90 deg to simulate toilet transfer. Transferred to chair and back with mod A to Rt and max A to left, with max cues for sequence for foot placement, where to push up from, RW management, and to shift weight fully onto  one leg to lift and turn on other leg, then to take steps back until she touches the chair with the back of her legs before sitting.  03/06/2023: Prosthetic Training with TTA prosthesis: See objective data for reassessment info.   PT recommended increasing wear to all awake hours except 1-2 hours midday.  Begin to wear to dialysis in 2 days.  Pt & husband verbalize understanding.  Pt reports that she takes sponge bath 2x/day.  PT recommended washing & drying area under liner first, then donning prosthesis prior to bathing remaining areas of body to enable prosthesis to assist with sitting balance & limited standing with RW to wash buttocks. Pt & husband verbalized  understanding.       HOME EXERCISE PROGRAM:  ASSESSMENT:  CLINICAL IMPRESSION: She is improving with her ambulation so I had her husband help her ambulate today with RW and wheelchair follow from CNA. PT was close guard with this but not needed so I did encourage patients husband and CNA to try walking with her at home for short distances if they feel safe to do so.   OBJECTIVE IMPAIRMENTS: Abnormal gait, decreased activity tolerance, decreased balance, decreased cognition, decreased coordination, decreased endurance, decreased knowledge of condition, decreased knowledge of use of DME, decreased mobility, difficulty walking, decreased ROM, decreased strength, postural dysfunction, prosthetic dependency , obesity, and pain.   ACTIVITY LIMITATIONS: standing, stairs, transfers, locomotion level, and safe prosthesis use.  PARTICIPATION LIMITATIONS: community activity, church, and household mobility  PERSONAL FACTORS: Age, Fitness, Past/current experiences, Time since onset of injury/illness/exacerbation, and 3+ comorbidities: see PMH  are also affecting patient's functional outcome.   REHAB POTENTIAL: Good  CLINICAL DECISION MAKING: Evolving/moderate complexity  EVALUATION COMPLEXITY: Moderate   GOALS: Goals reviewed with  patient? Yes  SHORT TERM GOALS: Target date: 04/06/2023  Patient reports how to correctly adjust dialysis weight and and weigh out to include prosthesis. Baseline: SEE OBJECTIVE DATA Goal status: Set 03/06/2023 2.  Patient tolerates prosthesis >90% of awake hrs/day without increase in skin issues or limb / knee pain <5/10 with standing. Baseline: SEE OBJECTIVE DATA Goal status:  Set 03/06/2023  3.  Patient stand pivot transfer with RW with minA. Baseline: SEE OBJECTIVE DATA Goal status: Set 03/06/2023  4. Patient ambulates 10' including turning 90* to position to sit with RW with modA of one person.  Baseline: SEE OBJECTIVE DATA Goal status: Set 03/06/2023    LONG TERM GOALS: Target date: 06/01/2023  Patient & family / CNA demonstrates & verbalized understanding of prosthetic care to enable safe utilization of prosthesis. Baseline: SEE OBJECTIVE DATA Goal status: ongoing 03/06/2023  Patient tolerates prosthesis wear >90% of awake hours without skin or limb pain issues and knee pain </= 4/10.  Baseline: SEE OBJECTIVE DATA Goal status:  ongoing 03/06/2023  Stand balance with walker: static 2 min with supervision to enable ability for standing ADLS. Baseline: SEE OBJECTIVE DATA Goal status: ongoing 03/06/2023  Patient ambulates >50' with RW & prosthesis only with family / CNA assist safely.  Baseline: SEE OBJECTIVE DATA Goal status:  ongoing 03/06/2023  Patient transfers stand pivot including sit to/from stand with RW with minA or less.  Baseline: SEE OBJECTIVE DATA Goal status:  updated 03/06/2023   PLAN:  PT FREQUENCY: 2x/week  PT DURATION: 13 weeks / 90 days  PLANNED INTERVENTIONS: Therapeutic exercises, Therapeutic activity, Neuromuscular re-education, Balance training, Gait training, Patient/Family education, Self Care, Vestibular training, Prosthetic training, DME instructions, and Manual therapy  PLAN FOR NEXT SESSION:  continue gait and transfers including stand pivot  with rolling walker, leg strength, balance  Referring diagnosis? Z61.096E (ICD-10-CM) - Below-knee amputation of right lower extremity  Treatment diagnosis? (if different than referring diagnosis)  Unsteadiness on feet R26.81   Other abnormalities of gait and mobility R26.89   Muscle weakness (generalized) M62.81   Hemiplegia and hemiparesis following cerebral infarction affecting left non-dominant side (HCC) I69.354   Non-pressure chronic ulcer of skin of other sites with other specified severity [facility]) L98.498   Chronic pain of both knees M25.561, M25.562, G89.29   What was this (referring dx) caused by? [x]  Surgery []  Fall []  Ongoing issue []  Arthritis []  Other: ____________   Laterality: [x]  Rt []   Lt []  Both   Check all possible CPT codes:                   *CHOOSE 10 OR LESS*                                [x]  97110 (Therapeutic Exercise)                   []  92507 (SLP Treatment)      [x]  97112 (Neuro Re-ed)                                []  16109 (Swallowing Treatment)                  [x]  [phone] (Gait Training)                                []  [phone] (Cognitive Training, 1st 15 minutes) []  97140 (Manual Therapy)                           []  97130 (Cognitive Training, each add'l 15 minutes)         [x]  97164 (Re-evaluation)                              []  Other, List CPT Code ____________  [x]  97530 (Therapeutic Activities)                                            [x]  97535 (Self Care)               []  All codes above (97110 - 97535)             []  97012 (Mechanical Traction)             []  97014 (E-stim Unattended)             []  97032 (E-stim manual)             []  97033 (Ionto)             []  97035 (Ultrasound) [x]  97750 (Physical Performance Training) []  [id number] (Aquatic Therapy) []  97016 (Vasopneumatic Device) []  [id number] (Paraffin) []  97034 (Contrast Bath) []  97597 (Wound Care 1st 20 sq cm) []  97598 (Wound Care each add'l 20 sq cm) []  97760 (Orthotic  Fabrication, Fitting, Training Initial) [x]  [id number] (Prosthetic Management and Training Initial) []  [id number] (Orthotic or Prosthetic Training/ Modification Subsequent)      April Manson, PT, DPT 03/30/2023, 4:36 PM

## 2023-04-04 ENCOUNTER — Encounter: Payer: Self-pay | Admitting: Physical Therapy

## 2023-04-04 ENCOUNTER — Ambulatory Visit: Payer: Medicare PPO | Admitting: Physical Therapy

## 2023-04-04 DIAGNOSIS — M6281 Muscle weakness (generalized): Secondary | ICD-10-CM

## 2023-04-04 DIAGNOSIS — L98498 Non-pressure chronic ulcer of skin of other sites with other specified severity: Secondary | ICD-10-CM

## 2023-04-04 DIAGNOSIS — M25562 Pain in left knee: Secondary | ICD-10-CM

## 2023-04-04 DIAGNOSIS — R2689 Other abnormalities of gait and mobility: Secondary | ICD-10-CM

## 2023-04-04 DIAGNOSIS — I69354 Hemiplegia and hemiparesis following cerebral infarction affecting left non-dominant side: Secondary | ICD-10-CM | POA: Diagnosis not present

## 2023-04-04 DIAGNOSIS — M25561 Pain in right knee: Secondary | ICD-10-CM

## 2023-04-04 DIAGNOSIS — R2681 Unsteadiness on feet: Secondary | ICD-10-CM

## 2023-04-04 DIAGNOSIS — G8929 Other chronic pain: Secondary | ICD-10-CM

## 2023-04-04 NOTE — Therapy (Signed)
OUTPATIENT PHYSICAL THERAPY TREATMENT  Patient Name: Carolyn Russo MRN: [id number] DOB:25-Sep-1951, 72 y.o., female Today's Date: 04/04/2023  PCP: No PCP on file REFERRING PROVIDER: Aldean Baker, MD     END OF SESSION:  PT End of Session - 04/04/23 1514     Visit Number 15    Number of Visits 36    Date for PT Re-Evaluation 06/01/23    Authorization Type [employer] PPO    Authorization Time Period $20 co-pay, Authorization #[id number]  Tracking #[id number] PT VISITS 5/13-06/01/23    Authorization - Visit Number 3    Authorization - Number of Visits 12    Progress Note Due on Visit 20    PT Start Time 1515    PT Stop Time 1600    PT Time Calculation (min) 45 min    Equipment Utilized During Treatment Gait belt    Activity Tolerance Patient tolerated treatment well;Patient limited by fatigue;Patient limited by pain    Behavior During Therapy WFL for tasks assessed/performed                Past Medical History:  Diagnosis Date   Acute GI bleeding    Allergy    Anemia    Anterior chest wall pain    Appendicitis 1965   Asthma    Body mass index 37.0-37.9, adult    Breast pain    Cataract    both eyes   CHF (congestive heart failure) (HCC)    Chronic kidney disease    stage 5 - on dialysis   Cognitive change 04/20/2021   r/t cva 03/2821   Complication of anesthesia    Memory loss after general   Dehydration 2014   Deviated septum 1971   Diabetes mellitus    Dysphagia due to old stroke    easy to get strangled when eating   Dyspnea 2014   Extrinsic asthma    WITH ASTHMA ATTACK   Fibroid 1980   GERD (gastroesophageal reflux disease)    Heart murmur    History of migraine    History of seizure    with stroke   Hx gestational diabetes    Hyperlipidemia    Hypertension 2014   Inguinal hernia 1959   Malaise and fatigue 2014   Non-IgE mediated allergic asthma 2014   Obesity    Pelvic pain    Pregnancy, high-risk 1985   Stroke (HCC)  04/20/2021   (CVA) of right basal ganglia   Tonsillitis 1968   Uterine fibroid 1980   Visual field defect    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   Past Surgical History:  Procedure Laterality Date   ABDOMINAL AORTOGRAM W/LOWER EXTREMITY N/A 02/21/2022   Procedure: ABDOMINAL AORTOGRAM W/LOWER EXTREMITY;  Surgeon: Maeola Harman, MD;  Location: [facility] INVASIVE CV LAB;  Service: Cardiovascular;  Laterality: N/A;   AMPUTATION Right 05/18/2022   Procedure: RIGHT BELOW KNEE AMPUTATION;  Surgeon: Nadara Mustard, MD;  Location: [facility] OR;  Service: Orthopedics;  Laterality: Right;   AMPUTATION TOE Right 04/15/2022   Procedure: AMPUTATION  LST TOERIGHT FOOT;  Surgeon: Edwin Cap, DPM;  Location: [facility] ORS;  Service: Podiatry;  Laterality: Right;   APPENDECTOMY  1959   BASCILIC VEIN TRANSPOSITION Right 04/30/2021   Procedure: RIGHT FIRST STAGE BASCILIC VEIN TRANSPOSITION;  Surgeon: Chuck Hint, MD;  Location: [facility] OR;  Service: Vascular;  Laterality: Right;   CESAREAN SECTION  1985   COLONOSCOPY     ESOPHAGOGASTRODUODENOSCOPY (EGD) WITH  PROPOFOL N/A 04/18/2021   Procedure: ESOPHAGOGASTRODUODENOSCOPY (EGD) WITH PROPOFOL;  Surgeon: Iva Boop, MD;  Location: [facility] ENDOSCOPY;  Service: Endoscopy;  Laterality: N/A;   EYE SURGERY     bilateral cataract    FLEXIBLE SIGMOIDOSCOPY N/A 04/18/2021   Procedure: FLEXIBLE SIGMOIDOSCOPY;  Surgeon: Iva Boop, MD;  Location: [facility] ENDOSCOPY;  Service: Endoscopy;  Laterality: N/A;   HERNIA REPAIR  1959   IR FLUORO GUIDE CV LINE RIGHT  03/18/2021   IR US GUIDE VASC ACCESS RIGHT  03/18/2021   LEFT HEART CATH AND CORONARY ANGIOGRAPHY N/A 04/04/2017   Procedure: Left Heart Cath and Coronary Angiography;  Surgeon: Lyn Records, MD;  Location: [facility] INVASIVE CV LAB;  Service: Cardiovascular;  Laterality: N/A;   MYOMECTOMY  1980, 2004, 2007   PERIPHERAL VASCULAR THROMBECTOMY  02/21/2022   Procedure: PERIPHERAL VASCULAR THROMBECTOMY;  Surgeon: Maeola Harman, MD;   Location: [facility] INVASIVE CV LAB;  Service: Cardiovascular;;  Right Tibial   RHINOPLASTY  1971   ROTATOR CUFF REPAIR  2003   SURGICAL REPAIR OF HEMORRHAGE  2015   TONSILLECTOMY  1968   Patient Active Problem List   Diagnosis Date Noted   Hx of right BKA (HCC) 03/10/2023   CHF (congestive heart failure) (HCC) 02/10/2023   Dyspnea 02/09/2023   Acute respiratory distress 02/09/2023   Urinary tract infection without hematuria    Diabetes mellitus due to underlying condition with hypoglycemia without coma, with long-term current use of insulin (HCC)    Seizure disorder (HCC) 06/22/2022   Acute respiratory failure (HCC)    Unresponsiveness    Pressure ulcer 06/07/2022   Acute stroke due to ischemia (HCC) 06/03/2022   Chest pain 06/01/2022   History of CVA (cerebrovascular accident) 06/01/2022   Seizure (HCC)    Encephalopathy    Below-knee amputation of right lower extremity (HCC)    Leukocytosis    Foot infection 05/10/2022   PAD (peripheral artery disease) (HCC) 05/10/2022   Gangrene of right foot (HCC) 04/15/2022   Left hemiparesis (HCC) 09/24/2021   Abnormality of gait 09/24/2021   Pain of right hand 08/06/2021   Steal syndrome of dialysis vascular access (HCC) 08/06/2021   Subclavian steal syndrome of right subclavian artery 06/25/2021   Right hand weakness 06/25/2021   Stroke-like symptoms 05/06/2021   Bandemia    Cerebrovascular accident (CVA) of right basal ganglia (HCC) 04/20/2021   Hemorrhoids    Basal ganglia infarction (HCC) 04/16/2021   ESRD on dialysis (HCC) 04/16/2021   Hypertensive urgency 03/10/2021   Hilar enlargement    Anemia of chronic disease    Chronic diastolic CHF (congestive heart failure) (HCC) 03/09/2021   CKD (chronic kidney disease), stage V (HCC) 03/08/2021   Diabetic retinopathy (HCC) 03/08/2021   Hyperglycemia due to type 2 diabetes mellitus (HCC) 03/08/2021   Pure hypercholesterolemia 03/08/2021   Anemia in chronic kidney disease 09/24/2020    Uncontrolled type 2 diabetes mellitus with hyperglycemia (HCC) 03/19/2019   Osteopenia 09/04/2018   Migraine 09/04/2018   Asthma 09/04/2018   Pseudophakia of both eyes 07/17/2018   Pseudophakia of left eye 07/03/2018   Open angle with borderline findings and high glaucoma risk in left eye 07/03/2018   Age-related nuclear cataract of right eye 07/03/2018   CAD (coronary artery disease), native coronary artery 04/27/2017   Abnormal nuclear stress test 04/04/2017   Shortness of breath 03/09/2017   Appendicitis    Age-related hypermature cataract of both eyes 12/20/2016   Uterine leiomyoma 11/27/2012   Dyslipidemia (  high LDL; low HDL) 11/25/2011   Obesity (BMI 30-39.9) 11/25/2011   Hypertensive disorder 11/21/2011   Type 2 diabetes mellitus (HCC) 11/21/2011   Abnormal EKG 11/21/2011    ONSET DATE: 12/29/2022 prosthesis delivery  REFERRING DIAG: V78.469G (ICD-10-CM) - Below-knee amputation of right lower extremity   THERAPY DIAG:  Unsteadiness on feet  Other abnormalities of gait and mobility  Muscle weakness (generalized)  Hemiplegia and hemiparesis following cerebral infarction affecting left non-dominant side (HCC)  Chronic pain of both knees  Non-pressure chronic ulcer of skin of other sites with other specified severity (HCC)  Rationale for Evaluation and Treatment: Rehabilitation  SUBJECTIVE:   SUBJECTIVE STATEMENT: The prosthesis is hot at times so she has take it off.    PERTINENT HISTORY: CVA / left hemiparesis due to right basal ganglia infarct 7/22, right hip bursitis, anxiety, dCHF, ESRD on HD, HTN, DM, CAD  PAIN:  Are you having pain?  Yes: NPRS scale:  0/10 sitting &  5 standing  Pain location: Rt knee Pain description: throbbing Aggravating factors: standing, walking Relieving factors: Voltarin & massage  PRECAUTIONS: Fall and Other: NO BP RUE  WEIGHT BEARING RESTRICTIONS: No  FALLS: Has patient fallen in last 6 months? No  LIVING  ENVIRONMENT: Lives with: lives with their spouse and CNAs 24 hrs 7 days / wk Lives in: House Home Access: Ramped entrance Home layout: Multi level, Full bath on main level, and Able to live on main level with bedroom and bathroom Stairs: Yes: Internal: 16 steps; on left going up and External: 4 steps; on right going up Has following equipment at home: Single point cane, Walker - 2 wheeled, Wheelchair (manual), and shower chair  PLOF: Independent with household mobility with device and Independent with community mobility with device  PATIENT GOALS: walk with prosthesis, stand & transfer.   OBJECTIVE:  COGNITION: Overall cognitive status: History of cognitive impairments - at baseline  POSTURE: rounded shoulders, forward head, decreased lumbar lordosis, increased thoracic kyphosis, and flexed trunk   LOWER EXTREMITY ROM: Eval / 01/05/2023:    BLEs PROM WFL except unable to assess hip ext  (Blank rows = not tested)  LOWER EXTREMITY MMT:  MMT Right eval Right 03/06/23  Hip flexion 3-/5 3-/5  Hip extension 3-/5 3-/5  Hip abduction 3-/5 3-/5  Hip adduction    Hip internal rotation    Hip external rotation    Knee flexion 3-/5 3-/5  Knee extension 3-/5 3-/5  Ankle dorsiflexion NA NA  Ankle plantarflexion NA NA  Ankle inversion NA NA  Ankle eversion NA NA  03/06/2023: LLE hemiparesis but able to advance during gait without assistance.   Eval / 01/05/2023: left LE & UE hemiparesis.  In gait slides to advance LLE and knee / hip instability in stance.  (Blank rows = not tested)  TRANSFERS: 03/06/2023:  Sit to stand w/c & 18" chair with armrests to RW with modA. Stand to sit with minA. Stand-pivot  transfer with RW with modA / constant cueing.  02/07/2023: Sit to stand w/c or 18" chair with armrests to RW with modA. Stand to sit with minA. Both require constant cues. Stand-pivot transfer with RW with modA / constant cueing. (2nd person for safety)   01/05/2023 / Eval: Sit to  stand: Max A pulling BUEs on //bars from w/c Stand to sit: Min A using BUEs //bars to control descent Lateral scoot transfers: Mod A w/c to / from mat table level & adjacent   GAIT: 03/06/2023: Pt amb  5' with RW with +2 modA including 90* turn to chair.  02/07/2023: Pt amb 5' with RW with +2 modA including 90* turn to chair.   01/05/2023 / Eval: Gait pattern: step to pattern, decreased step length- Right, decreased step length- Left, decreased stance time- Right, decreased stance time- Left, decreased hip/knee flexion- Left, decreased ankle dorsiflexion- Left, Right hip hike, Left hip hike, knee flexed in stance- Left, antalgic, lateral hip instability, trunk flexed, wide BOS, and poor foot clearance- Left Distance walked: 3' Assistive device utilized: // bars & RLE TTA prosthesis Level of assistance: Total A / 2 person Comments:  LLE with poor clearance / slides foot forward and knee / hip instability in stance.   FUNCTIONAL TESTs:  03/06/2023: static stance with RW support 60 sec with minA.  Eval / 01/05/2023:  Standing in //bars for 30 sec with modA.    CURRENT PROSTHETIC WEAR ASSESSMENT: 03/06/2023: Patient's husband & CNA correctly don & doff her prosthesis. Pt is dependent requiring maxA to don or doff.  She reports wearing prosthesis 5 hrs 2x/day for 10 hours total on non-dialysis days and up to 8 hours on dialysis days.  Her limb has scab on lateral incision that appears to be internal suture working way out. No signs of infection. Her residual limb appearance is dry skin, normal temperature, limited to no hair growth, cylindrical shape.   Eval / 01/05/2023:  Patient is dependent with: skin check, residual limb care, care of non-amputated limb, prosthetic cleaning, ply sock cleaning, correct ply sock adjustment, proper wear schedule/adjustment, and proper weight-bearing schedule/adjustment Donning prosthesis: Total A / family & CNA requires 100% cueing Doffing prosthesis: Total A /  family & CNA requires 100% cueing Prosthetic wear tolerance: 1-1.5 hours, 1x/day, 6 of 7 days since delivery Prosthetic weight bearing tolerance: 3 minutes during eval with c/o knee pain Edema: pitting Residual limb condition: lateral incision with 3mm X 6mm long scabs with serous drainage, arrived with bandaid over wound,  dry skin, normal temperature & color, cylindrical shape Prosthetic description: silicon liner with pin lock suspension, total contact socket, SACH foot K code/activity level with prosthetic use: Level 1    TODAY'S TREATMENT:                                                                                                                             DATE:  04/04/2023: Prosthetic Training with TTA prosthesis PT verbally educated patient, husband and CNA in recommendation for wearing prosthesis all awake hours.  Patient has a lower fall risk and higher potential for activity when wearing the prosthesis.  PT explained that amputations calls increased sweating.  When her prosthesis appears hot she should remove the prosthesis pat her limb and the liner dry and then redonne the prosthesis without allowing the skin to cool.  This will enable her body to accommodate to the temperature of the prosthesis sooner to decrease the amount of sweating.  Patient and husband verbalized  understanding Sit to stand with RW and patient pushing off wheelchair with mod assist, requires cues for set up, hand placement, and sequence, X 6 reps total. Stand pivot transfer with RW with minA once patient is standing.  PT educated patient's husband and CNA and set up and go for patient to be able to transfer from the wheelchair to a recliner or another sitting surface at the home.  Patient apparently likes to sit on the couch so PT recommended sitting near the right armrest so that she can use her right upper extremity for sit/stand.  Patient, husband and CNA verbalized understanding PT demo and verbal cues on how  to safely assist patient utilizing the gait belt and RW.  If the patient is slightly off balance and they should assist her with stabilization.  If her legs are giving out or they are unable to stabilize her then they should not use the gait belt for controlled lowering to the floor.  The goal is for her to not be injured or the person assisting her.  When she is safely lowered to the floor then they can call 911 to have the fire department come over and assist her off of the floor.  Patient, husband and CNA verbalized understanding PT had patient's husband and CNA determine how many of their steps it took to walk a measure distance of 20 feet.  Both took 10 steps to cross the distance.  PT educated in using fiber there are steps to know that the chairs or 10 feet apart.  This will enable them to start to work on only having 1 person need to his sister so that they can walk between 2 chairs.  Initially the second person may need to be present standing or behind the wheelchair for quick movement of the wheelchair for assistance if needed but the goal is to work away from needing the wheelchair to follow the patient.  They verbalized understanding. Patient ambulated 10 feet x 2 turning 90 degrees to position in front of second chair with rolling walker and min A.  Second 10 foot gait with CNA assisting in PT providing close supervision/CGA with verbal cues on how to safely assist patient.  CNA and husband verbalized confidence with beginning to assist patient in short distance gait outside of therapy.    03/30/23 Prosthetic Training with TTA prosthesis Sit to stand with RW and patient pushing off wheelchair with mod assist, requires cues for set up, hand placement, and sequence, X 6 reps total Patient ambulated 10 feet, then 25 feet  with rolling walker and min A once ambulating one extra person for chair follow (CNA). Stand pivot transfers from Wheelchair to leg press with RW and mod A overall for turning,  and RW management and then once sitting on leg press chair, max A for management of placing legs on leg press platform moving from sittting to supine at 45 deg incline. Leg press DL 16# 1W96, then single leg 25# 2X10 each side with manual assistance at left knee to reduce knee varus/hip IR Max A to transfer from leg press supine at 45 deg to sitting on edge of leg press. Then mod A to transfer from leg press to wheel chair at 90 deg.  Patient ambulated another 20 feet, then 25 feet with rolling walker and min A from husband while PT was CGA just in case and one extra person for chair follow (CNA).  03/28/23 Prosthetic Training with TTA prosthesis Sit to stand  with RW and patient pushing off wheelchair with mod assist, requires cues for set up, hand placement, and sequence, X 5 reps total Patient ambulated 10 feet, then 25 feet  with rolling walker and min A once ambulating one extra person for chair follow (CNA). Stand pivot transfers from Wheelchair to  leg press with RW and mod A overall for turning, and RW management and then once sitting on leg press chair, max A for management of placing legs on leg press platform moving from sittting to supine at 45 deg incline. Leg press DL 16# 1W96, then single leg 25# 2X10 each side with manual assistance at left knee to reduce knee varus/hip IR Max A to transfer from leg press supine at 45 deg to sitting on edge of leg press. Then mod A to transfer from leg press to standard chair at 90 deg.  Patient ambulated another 25 feet, then 20 feet  with rolling walker and min A once ambulating one extra person for chair follow (CNA).    HOME EXERCISE PROGRAM:  ASSESSMENT:  CLINICAL IMPRESSION: Patient met STG's set for this 30-day period.  Patient's husband and CNA appear to have a better understanding of how to assist patient.  PT working for the ability for the patient to walk between 2 chairs to enable only 1 person required to assist.  Patient continues to  benefit from skilled PT  OBJECTIVE IMPAIRMENTS: Abnormal gait, decreased activity tolerance, decreased balance, decreased cognition, decreased coordination, decreased endurance, decreased knowledge of condition, decreased knowledge of use of DME, decreased mobility, difficulty walking, decreased ROM, decreased strength, postural dysfunction, prosthetic dependency , obesity, and pain.   ACTIVITY LIMITATIONS: standing, stairs, transfers, locomotion level, and safe prosthesis use.  PARTICIPATION LIMITATIONS: community activity, church, and household mobility  PERSONAL FACTORS: Age, Fitness, Past/current experiences, Time since onset of injury/illness/exacerbation, and 3+ comorbidities: see PMH  are also affecting patient's functional outcome.   REHAB POTENTIAL: Good  CLINICAL DECISION MAKING: Evolving/moderate complexity  EVALUATION COMPLEXITY: Moderate   GOALS: Goals reviewed with patient? Yes  SHORT TERM GOALS: Target date: 04/06/2023  Patient reports how to correctly adjust dialysis weight and and weigh out to include prosthesis. Baseline: SEE OBJECTIVE DATA Goal status: Met 04/04/2023 2.  Patient tolerates prosthesis >90% of awake hrs/day without increase in skin issues or limb / knee pain <5/10 with standing. Baseline: SEE OBJECTIVE DATA Goal status:  Met 04/04/2023  3.  Patient stand pivot transfer with RW with minA. Baseline: SEE OBJECTIVE DATA Goal status: partially MET 04/04/2023  4. Patient ambulates 10' including turning 90* to position to sit with RW with modA of one person.  Baseline: SEE OBJECTIVE DATA Goal status: Met 04/04/2023    LONG TERM GOALS: Target date: 06/01/2023  Patient & family / CNA demonstrates & verbalized understanding of prosthetic care to enable safe utilization of prosthesis. Baseline: SEE OBJECTIVE DATA Goal status: ongoing 03/06/2023  Patient tolerates prosthesis wear >90% of awake hours without skin or limb pain issues and knee pain </= 4/10.   Baseline: SEE OBJECTIVE DATA Goal status:  ongoing 03/06/2023  Stand balance with walker: static 2 min with supervision to enable ability for standing ADLS. Baseline: SEE OBJECTIVE DATA Goal status: ongoing 03/06/2023  Patient ambulates >50' with RW & prosthesis only with family / CNA assist safely.  Baseline: SEE OBJECTIVE DATA Goal status:  ongoing 03/06/2023  Patient transfers stand pivot including sit to/from stand with RW with minA or less.  Baseline: SEE OBJECTIVE DATA Goal status:  updated 03/06/2023   PLAN:  PT FREQUENCY: 2x/week  PT DURATION: 13 weeks / 90 days  PLANNED INTERVENTIONS: Therapeutic exercises, Therapeutic activity, Neuromuscular re-education, Balance training, Gait training, Patient/Family education, Self Care, Vestibular training, Prosthetic training, DME instructions, and Manual therapy  PLAN FOR NEXT SESSION: Work on patient walking between 2 chairs and decreasing need for wheelchair to be followed behind patient.  Continue gait and transfers including stand pivot with rolling walker, leg strength, balance    Vladimir Faster, PT, DPT 04/04/2023, 4:44 PM

## 2023-04-06 ENCOUNTER — Encounter: Payer: Self-pay | Admitting: Physical Therapy

## 2023-04-06 ENCOUNTER — Ambulatory Visit: Payer: Medicare PPO | Admitting: Physical Therapy

## 2023-04-06 DIAGNOSIS — M6281 Muscle weakness (generalized): Secondary | ICD-10-CM

## 2023-04-06 DIAGNOSIS — M25561 Pain in right knee: Secondary | ICD-10-CM

## 2023-04-06 DIAGNOSIS — R2689 Other abnormalities of gait and mobility: Secondary | ICD-10-CM | POA: Diagnosis not present

## 2023-04-06 DIAGNOSIS — G8929 Other chronic pain: Secondary | ICD-10-CM

## 2023-04-06 DIAGNOSIS — I69354 Hemiplegia and hemiparesis following cerebral infarction affecting left non-dominant side: Secondary | ICD-10-CM | POA: Diagnosis not present

## 2023-04-06 DIAGNOSIS — M25562 Pain in left knee: Secondary | ICD-10-CM

## 2023-04-06 DIAGNOSIS — R2681 Unsteadiness on feet: Secondary | ICD-10-CM

## 2023-04-06 NOTE — Therapy (Signed)
OUTPATIENT PHYSICAL THERAPY TREATMENT  Patient Name: LARUTH MUSANTE MRN: [id number] DOB:07-Jul-1951, 72 y.o., female Today's Date: 04/06/2023  PCP: No PCP on file REFERRING PROVIDER: Aldean Baker, MD     END OF SESSION:  PT End of Session - 04/06/23 1643     Visit Number 16    Number of Visits 36    Date for PT Re-Evaluation 06/01/23    Authorization Type [employer] PPO    Authorization Time Period $20 co-pay, Authorization #[id number]  Tracking #[id number] PT VISITS 5/13-06/01/23    Authorization - Number of Visits 13    Progress Note Due on Visit 20    PT Start Time 1602    PT Stop Time 1647    PT Time Calculation (min) 45 min    Equipment Utilized During Treatment Gait belt    Activity Tolerance Patient tolerated treatment well;Patient limited by fatigue;Patient limited by pain    Behavior During Therapy WFL for tasks assessed/performed                Past Medical History:  Diagnosis Date   Acute GI bleeding    Allergy    Anemia    Anterior chest wall pain    Appendicitis 1965   Asthma    Body mass index 37.0-37.9, adult    Breast pain    Cataract    both eyes   CHF (congestive heart failure) (HCC)    Chronic kidney disease    stage 5 - on dialysis   Cognitive change 04/20/2021   r/t cva 03/2821   Complication of anesthesia    Memory loss after general   Dehydration 2014   Deviated septum 1971   Diabetes mellitus    Dysphagia due to old stroke    easy to get strangled when eating   Dyspnea 2014   Extrinsic asthma    WITH ASTHMA ATTACK   Fibroid 1980   GERD (gastroesophageal reflux disease)    Heart murmur    History of migraine    History of seizure    with stroke   Hx gestational diabetes    Hyperlipidemia    Hypertension 2014   Inguinal hernia 1959   Malaise and fatigue 2014   Non-IgE mediated allergic asthma 2014   Obesity    Pelvic pain    Pregnancy, high-risk 1985   Stroke (HCC) 04/20/2021   (CVA) of right basal  ganglia   Tonsillitis 1968   Uterine fibroid 1980   Visual field defect    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   Past Surgical History:  Procedure Laterality Date   ABDOMINAL AORTOGRAM W/LOWER EXTREMITY N/A 02/21/2022   Procedure: ABDOMINAL AORTOGRAM W/LOWER EXTREMITY;  Surgeon: Maeola Harman, MD;  Location[facility] INVASIVE CV LAB;  Service: Cardiovascular;  Laterality: N/A;   AMPUTATION Right 05/18/2022   Procedure: RIGHT BELOW KNEE AMPUTATION;  Surgeon: Nadara Mustard, MD;  Location: [facility] OR;  Service: Orthopedics;  Laterality: Right;   AMPUTATION TOE Right 04/15/2022   Procedure: AMPUTATION  LST TOERIGHT FOOT;  Surgeon: Edwin Cap, DPM;  Location: [facility] ORS;  Service: Podiatry;  Laterality: Right;   APPENDECTOMY  1959   BASCILIC VEIN TRANSPOSITION Right 04/30/2021   Procedure: RIGHT FIRST STAGE BASCILIC VEIN TRANSPOSITION;  Surgeon: Chuck Hint, MD;  Location: [facility] OR;  Service: Vascular;  Laterality: Right;   CESAREAN SECTION  1985   COLONOSCOPY     ESOPHAGOGASTRODUODENOSCOPY (EGD) WITH PROPOFOL N/A 04/18/2021   Procedure: ESOPHAGOGASTRODUODENOSCOPY (EGD)  WITH PROPOFOL;  Surgeon: Iva Boop, MD;  Location: [facility] ENDOSCOPY;  Service: Endoscopy;  Laterality: N/A;   EYE SURGERY     bilateral cataract    FLEXIBLE SIGMOIDOSCOPY N/A 04/18/2021   Procedure: FLEXIBLE SIGMOIDOSCOPY;  Surgeon: Iva Boop, MD;  Location: [facility] ENDOSCOPY;  Service: Endoscopy;  Laterality: N/A;   HERNIA REPAIR  1959   IR FLUORO GUIDE CV LINE RIGHT  03/18/2021   IR US GUIDE VASC ACCESS RIGHT  03/18/2021   LEFT HEART CATH AND CORONARY ANGIOGRAPHY N/A 04/04/2017   Procedure: Left Heart Cath and Coronary Angiography;  Surgeon: Lyn Records, MD;  Location: [facility] INVASIVE CV LAB;  Service: Cardiovascular;  Laterality: N/A;   MYOMECTOMY  1980, 2004, 2007   PERIPHERAL VASCULAR THROMBECTOMY  02/21/2022   Procedure: PERIPHERAL VASCULAR THROMBECTOMY;  Surgeon: Maeola Harman, MD;  Location: [facility] INVASIVE CV LAB;   Service: Cardiovascular;;  Right Tibial   RHINOPLASTY  1971   ROTATOR CUFF REPAIR  2003   SURGICAL REPAIR OF HEMORRHAGE  2015   TONSILLECTOMY  1968   Patient Active Problem List   Diagnosis Date Noted   Hx of right BKA (HCC) 03/10/2023   CHF (congestive heart failure) (HCC) 02/10/2023   Dyspnea 02/09/2023   Acute respiratory distress 02/09/2023   Urinary tract infection without hematuria    Diabetes mellitus due to underlying condition with hypoglycemia without coma, with long-term current use of insulin (HCC)    Seizure disorder (HCC) 06/22/2022   Acute respiratory failure (HCC)    Unresponsiveness    Pressure ulcer 06/07/2022   Acute stroke due to ischemia (HCC) 06/03/2022   Chest pain 06/01/2022   History of CVA (cerebrovascular accident) 06/01/2022   Seizure (HCC)    Encephalopathy    Below-knee amputation of right lower extremity (HCC)    Leukocytosis    Foot infection 05/10/2022   PAD (peripheral artery disease) (HCC) 05/10/2022   Gangrene of right foot (HCC) 04/15/2022   Left hemiparesis (HCC) 09/24/2021   Abnormality of gait 09/24/2021   Pain of right hand 08/06/2021   Steal syndrome of dialysis vascular access (HCC) 08/06/2021   Subclavian steal syndrome of right subclavian artery 06/25/2021   Right hand weakness 06/25/2021   Stroke-like symptoms 05/06/2021   Bandemia    Cerebrovascular accident (CVA) of right basal ganglia (HCC) 04/20/2021   Hemorrhoids    Basal ganglia infarction (HCC) 04/16/2021   ESRD on dialysis (HCC) 04/16/2021   Hypertensive urgency 03/10/2021   Hilar enlargement    Anemia of chronic disease    Chronic diastolic CHF (congestive heart failure) (HCC) 03/09/2021   CKD (chronic kidney disease), stage V (HCC) 03/08/2021   Diabetic retinopathy (HCC) 03/08/2021   Hyperglycemia due to type 2 diabetes mellitus (HCC) 03/08/2021   Pure hypercholesterolemia 03/08/2021   Anemia in chronic kidney disease 09/24/2020   Uncontrolled type 2 diabetes  mellitus with hyperglycemia (HCC) 03/19/2019   Osteopenia 09/04/2018   Migraine 09/04/2018   Asthma 09/04/2018   Pseudophakia of both eyes 07/17/2018   Pseudophakia of left eye 07/03/2018   Open angle with borderline findings and high glaucoma risk in left eye 07/03/2018   Age-related nuclear cataract of right eye 07/03/2018   CAD (coronary artery disease), native coronary artery 04/27/2017   Abnormal nuclear stress test 04/04/2017   Shortness of breath 03/09/2017   Appendicitis    Age-related hypermature cataract of both eyes 12/20/2016   Uterine leiomyoma 11/27/2012   Dyslipidemia (high LDL; low HDL) 11/25/2011   Obesity (  BMI 30-39.9) 11/25/2011   Hypertensive disorder 11/21/2011   Type 2 diabetes mellitus (HCC) 11/21/2011   Abnormal EKG 11/21/2011    ONSET DATE: 12/29/2022 prosthesis delivery  REFERRING DIAG: Z61.096E (ICD-10-CM) - Below-knee amputation of right lower extremity   THERAPY DIAG:  Unsteadiness on feet  Other abnormalities of gait and mobility  Muscle weakness (generalized)  Hemiplegia and hemiparesis following cerebral infarction affecting left non-dominant side (HCC)  Chronic pain of both knees  Rationale for Evaluation and Treatment: Rehabilitation  SUBJECTIVE:   SUBJECTIVE STATEMENT: The prosthesis is okay, she feels like it weighs 900 lbs. She is tired today and expresses some distal residual limb pain.   PERTINENT HISTORY: CVA / left hemiparesis due to right basal ganglia infarct 7/22, right hip bursitis, anxiety, dCHF, ESRD on HD, HTN, DM, CAD  PAIN:  Are you having pain?  Yes: NPRS scale:  0/10 sitting &  5 standing  Pain location: Rt knee Pain description: throbbing Aggravating factors: standing, walking Relieving factors: Voltarin & massage  PRECAUTIONS: Fall and Other: NO BP RUE  WEIGHT BEARING RESTRICTIONS: No  FALLS: Has patient fallen in last 6 months? No  LIVING ENVIRONMENT: Lives with: lives with their spouse and CNAs 24 hrs 7  days / wk Lives in: House Home Access: Ramped entrance Home layout: Multi level, Full bath on main level, and Able to live on main level with bedroom and bathroom Stairs: Yes: Internal: 16 steps; on left going up and External: 4 steps; on right going up Has following equipment at home: Single point cane, Walker - 2 wheeled, Wheelchair (manual), and shower chair  PLOF: Independent with household mobility with device and Independent with community mobility with device  PATIENT GOALS: walk with prosthesis, stand & transfer.   OBJECTIVE:  COGNITION: Overall cognitive status: History of cognitive impairments - at baseline  POSTURE: rounded shoulders, forward head, decreased lumbar lordosis, increased thoracic kyphosis, and flexed trunk   LOWER EXTREMITY ROM: Eval / 01/05/2023:    BLEs PROM WFL except unable to assess hip ext  (Blank rows = not tested)  LOWER EXTREMITY MMT:  MMT Right eval Right 03/06/23  Hip flexion 3-/5 3-/5  Hip extension 3-/5 3-/5  Hip abduction 3-/5 3-/5  Hip adduction    Hip internal rotation    Hip external rotation    Knee flexion 3-/5 3-/5  Knee extension 3-/5 3-/5  Ankle dorsiflexion NA NA  Ankle plantarflexion NA NA  Ankle inversion NA NA  Ankle eversion NA NA  03/06/2023: LLE hemiparesis but able to advance during gait without assistance.   Eval / 01/05/2023: left LE & UE hemiparesis.  In gait slides to advance LLE and knee / hip instability in stance.  (Blank rows = not tested)  TRANSFERS: 03/06/2023:  Sit to stand w/c & 18" chair with armrests to RW with modA. Stand to sit with minA. Stand-pivot  transfer with RW with modA / constant cueing.  02/07/2023: Sit to stand w/c or 18" chair with armrests to RW with modA. Stand to sit with minA. Both require constant cues. Stand-pivot transfer with RW with modA / constant cueing. (2nd person for safety)   01/05/2023 / Eval: Sit to stand: Max A pulling BUEs on //bars from w/c Stand to sit: Min A using  BUEs //bars to control descent Lateral scoot transfers: Mod A w/c to / from mat table level & adjacent   GAIT: 03/06/2023: Pt amb 5' with RW with +2 modA including 90* turn to chair.  02/07/2023:  Pt amb 5' with RW with +2 modA including 90* turn to chair.   01/05/2023 / Eval: Gait pattern: step to pattern, decreased step length- Right, decreased step length- Left, decreased stance time- Right, decreased stance time- Left, decreased hip/knee flexion- Left, decreased ankle dorsiflexion- Left, Right hip hike, Left hip hike, knee flexed in stance- Left, antalgic, lateral hip instability, trunk flexed, wide BOS, and poor foot clearance- Left Distance walked: 3' Assistive device utilized: // bars & RLE TTA prosthesis Level of assistance: Total A / 2 person Comments:  LLE with poor clearance / slides foot forward and knee / hip instability in stance.   FUNCTIONAL TESTs:  03/06/2023: static stance with RW support 60 sec with minA.  Eval / 01/05/2023:  Standing in //bars for 30 sec with modA.    CURRENT PROSTHETIC WEAR ASSESSMENT: 03/06/2023: Patient's husband & CNA correctly don & doff her prosthesis. Pt is dependent requiring maxA to don or doff.  She reports wearing prosthesis 5 hrs 2x/day for 10 hours total on non-dialysis days and up to 8 hours on dialysis days.  Her limb has scab on lateral incision that appears to be internal suture working way out. No signs of infection. Her residual limb appearance is dry skin, normal temperature, limited to no hair growth, cylindrical shape.   Eval / 01/05/2023:  Patient is dependent with: skin check, residual limb care, care of non-amputated limb, prosthetic cleaning, ply sock cleaning, correct ply sock adjustment, proper wear schedule/adjustment, and proper weight-bearing schedule/adjustment Donning prosthesis: Total A / family & CNA requires 100% cueing Doffing prosthesis: Total A / family & CNA requires 100% cueing Prosthetic wear tolerance: 1-1.5  hours, 1x/day, 6 of 7 days since delivery Prosthetic weight bearing tolerance: 3 minutes during eval with c/o knee pain Edema: pitting Residual limb condition: lateral incision with 3mm X 6mm long scabs with serous drainage, arrived with bandaid over wound,  dry skin, normal temperature & color, cylindrical shape Prosthetic description: silicon liner with pin lock suspension, total contact socket, SACH foot K code/activity level with prosthetic use: Level 1    TODAY'S TREATMENT:                                                                                                                             DATE:  04/06/23 Prosthetic Training with TTA prosthesis Sit to stand with RW and patient pushing off wheelchair with min to mod assist, requires cues for set up, hand placement, and sequence, X 6 reps total Patient ambulated 20 feet X 4 with rolling walker and min A once ambulating Step ups in // bars with bilat UE support onto 4 inch step X 1 rep each leg with min to mod overall assistance needed. Stand pivot transfers from Wheelchair to leg press with RW and mod A overall for turning, and RW management and then once sitting on leg press chair, max A for management of placing legs on leg press platform moving from  sittting to supine at 45 deg incline. Leg press DL 46# [id number], then single leg 31# X10 each side with manual assistance at left knee to reduce knee varus/hip IT Max A to transfer from leg press supine at 45 deg to sitting on edge of leg press. Then mod A to transfer from leg press to wheel chair at 90 deg.   03/30/23 Prosthetic Training with TTA prosthesis Sit to stand with RW and patient pushing off wheelchair with mod assist, requires cues for set up, hand placement, and sequence, X 6 reps total Patient ambulated 10 feet, then 25 feet  with rolling walker and min A once ambulating one extra person for chair follow (CNA). Stand pivot transfers from Wheelchair to leg press with RW and mod A  overall for turning, and RW management and then once sitting on leg press chair, max A for management of placing legs on leg press platform moving from sittting to supine at 45 deg incline. Leg press DL 52# [id number], then single leg 25# 2X10 each side with manual assistance at left knee to reduce knee varus/hip IR Max A to transfer from leg press supine at 45 deg to sitting on edge of leg press. Then mod A to transfer from leg press to wheel chair at 90 deg.  Patient ambulated another 20 feet, then 25 feet with rolling walker and min A from husband while PT was CGA just in case and one extra person for chair follow (CNA).d  04/04/2023: Prosthetic Training with TTA prosthesis PT verbally educated patient, husband and CNA in recommendation for wearing prosthesis all awake hours.  Patient has a lower fall risk and higher potential for activity when wearing the prosthesis.  PT explained that amputations calls increased sweating.  When her prosthesis appears hot she should remove the prosthesis pat her limb and the liner dry and then redonne the prosthesis without allowing the skin to cool.  This will enable her body to accommodate to the temperature of the prosthesis sooner to decrease the amount of sweating.  Patient and husband verbalized understanding Sit to stand with RW and patient pushing off wheelchair with mod assist, requires cues for set up, hand placement, and sequence, X 6 reps total. Stand pivot transfer with RW with minA once patient is standing.  PT educated patient's husband and CNA and set up and go for patient to be able to transfer from the wheelchair to a recliner or another sitting surface at the home.  Patient apparently likes to sit on the couch so PT recommended sitting near the right armrest so that she can use her right upper extremity for sit/stand.  Patient, husband and CNA verbalized understanding PT demo and verbal cues on how to safely assist patient utilizing the gait belt and RW.   If the patient is slightly off balance and they should assist her with stabilization.  If her legs are giving out or they are unable to stabilize her then they should not use the gait belt for controlled lowering to the floor.  The goal is for her to not be injured or the person assisting her.  When she is safely lowered to the floor then they can call 911 to have the fire department come over and assist her off of the floor.  Patient, husband and CNA verbalized understanding PT had patient's husband and CNA determine how many of their steps it took to walk a measure distance of 20 feet.  Both took 10 steps to cross  the distance.  PT educated in using fiber there are steps to know that the chairs or 10 feet apart.  This will enable them to start to work on only having 1 person need to his sister so that they can walk between 2 chairs.  Initially the second person may need to be present standing or behind the wheelchair for quick movement of the wheelchair for assistance if needed but the goal is to work away from needing the wheelchair to follow the patient.  They verbalized understanding. Patient ambulated 10 feet x 2 turning 90 degrees to position in front of second chair with rolling walker and min A.  Second 10 foot gait with CNA assisting in PT providing close supervision/CGA with verbal cues on how to safely assist patient.  CNA and husband verbalized confidence with beginning to assist patient in short distance gait outside of therapy.     HOME EXERCISE PROGRAM:  ASSESSMENT:  CLINICAL IMPRESSION: She is slowly improving her max tolerated ambulation distances but does require max encouragement and rest breaks with this. She is slowly improving with leg strength and was able to perform more weight on leg press as well as step up onto 4 inch step today even though this was difficult for her. PT recommending to continue with current PT plan.  OBJECTIVE IMPAIRMENTS: Abnormal gait, decreased activity  tolerance, decreased balance, decreased cognition, decreased coordination, decreased endurance, decreased knowledge of condition, decreased knowledge of use of DME, decreased mobility, difficulty walking, decreased ROM, decreased strength, postural dysfunction, prosthetic dependency , obesity, and pain.   ACTIVITY LIMITATIONS: standing, stairs, transfers, locomotion level, and safe prosthesis use.  PARTICIPATION LIMITATIONS: community activity, church, and household mobility  PERSONAL FACTORS: Age, Fitness, Past/current experiences, Time since onset of injury/illness/exacerbation, and 3+ comorbidities: see PMH  are also affecting patient's functional outcome.   REHAB POTENTIAL: Good  CLINICAL DECISION MAKING: Evolving/moderate complexity  EVALUATION COMPLEXITY: Moderate   GOALS: Goals reviewed with patient? Yes  SHORT TERM GOALS: Target date: 04/06/2023  Patient reports how to correctly adjust dialysis weight and and weigh out to include prosthesis. Baseline: SEE OBJECTIVE DATA Goal status: Met 04/04/2023 2.  Patient tolerates prosthesis >90% of awake hrs/day without increase in skin issues or limb / knee pain <5/10 with standing. Baseline: SEE OBJECTIVE DATA Goal status:  Met 04/04/2023  3.  Patient stand pivot transfer with RW with minA. Baseline: SEE OBJECTIVE DATA Goal status: partially MET 04/04/2023  4. Patient ambulates 10' including turning 90* to position to sit with RW with modA of one person.  Baseline: SEE OBJECTIVE DATA Goal status: Met 04/04/2023    LONG TERM GOALS: Target date: 06/01/2023  Patient & family / CNA demonstrates & verbalized understanding of prosthetic care to enable safe utilization of prosthesis. Baseline: SEE OBJECTIVE DATA Goal status: ongoing 03/06/2023  Patient tolerates prosthesis wear >90% of awake hours without skin or limb pain issues and knee pain </= 4/10.  Baseline: SEE OBJECTIVE DATA Goal status:  ongoing 03/06/2023  Stand balance with  walker: static 2 min with supervision to enable ability for standing ADLS. Baseline: SEE OBJECTIVE DATA Goal status: ongoing 03/06/2023  Patient ambulates >50' with RW & prosthesis only with family / CNA assist safely.  Baseline: SEE OBJECTIVE DATA Goal status:  ongoing 03/06/2023  Patient transfers stand pivot including sit to/from stand with RW with minA or less.  Baseline: SEE OBJECTIVE DATA Goal status:  updated 03/06/2023   PLAN:  PT FREQUENCY: 2x/week  PT DURATION: 13 weeks /  90 days  PLANNED INTERVENTIONS: Therapeutic exercises, Therapeutic activity, Neuromuscular re-education, Balance training, Gait training, Patient/Family education, Self Care, Vestibular training, Prosthetic training, DME instructions, and Manual therapy  PLAN FOR NEXT SESSION: Work on patient walking between 2 chairs and decreasing need for wheelchair to be followed behind patient.  Continue gait and transfers including stand pivot with rolling walker, leg strength, balance    April Manson, PT, DPT 04/06/2023, 4:44 PM

## 2023-04-11 ENCOUNTER — Ambulatory Visit: Payer: Medicare PPO | Admitting: Physical Therapy

## 2023-04-11 ENCOUNTER — Encounter: Payer: Self-pay | Admitting: Physical Therapy

## 2023-04-11 DIAGNOSIS — M25562 Pain in left knee: Secondary | ICD-10-CM

## 2023-04-11 DIAGNOSIS — M6281 Muscle weakness (generalized): Secondary | ICD-10-CM | POA: Diagnosis not present

## 2023-04-11 DIAGNOSIS — I69354 Hemiplegia and hemiparesis following cerebral infarction affecting left non-dominant side: Secondary | ICD-10-CM

## 2023-04-11 DIAGNOSIS — R2681 Unsteadiness on feet: Secondary | ICD-10-CM

## 2023-04-11 DIAGNOSIS — M25561 Pain in right knee: Secondary | ICD-10-CM

## 2023-04-11 DIAGNOSIS — R2689 Other abnormalities of gait and mobility: Secondary | ICD-10-CM

## 2023-04-11 DIAGNOSIS — G8929 Other chronic pain: Secondary | ICD-10-CM

## 2023-04-11 DIAGNOSIS — L98498 Non-pressure chronic ulcer of skin of other sites with other specified severity: Secondary | ICD-10-CM

## 2023-04-11 NOTE — Therapy (Signed)
OUTPATIENT PHYSICAL THERAPY TREATMENT  Patient Name: MELENA DOVEY MRN: [id number] DOB:1950-12-07, 72 y.o., female Today's Date: 04/11/2023  PCP: No PCP on file REFERRING PROVIDER: Aldean Baker, MD     END OF SESSION:  PT End of Session - 04/11/23 1520     Visit Number 17    Number of Visits 36    Date for PT Re-Evaluation 06/01/23    Authorization Type [employer] PPO    Authorization Time Period $20 co-pay, Authorization #[id number]  Tracking #[id number] 12 PT VISITS 5/13-06/01/23    Authorization - Visit Number 4    Authorization - Number of Visits 12    Progress Note Due on Visit 20    PT Start Time 1515    PT Stop Time 1602    PT Time Calculation (min) 47 min    Equipment Utilized During Treatment Gait belt    Activity Tolerance Patient tolerated treatment well;Patient limited by fatigue;Patient limited by pain    Behavior During Therapy WFL for tasks assessed/performed                 Past Medical History:  Diagnosis Date   Acute GI bleeding    Allergy    Anemia    Anterior chest wall pain    Appendicitis 1965   Asthma    Body mass index 37.0-37.9, adult    Breast pain    Cataract    both eyes   CHF (congestive heart failure) (HCC)    Chronic kidney disease    stage 5 - on dialysis   Cognitive change 04/20/2021   r/t cva 03/2821   Complication of anesthesia    Memory loss after general   Dehydration 2014   Deviated septum 1971   Diabetes mellitus    Dysphagia due to old stroke    easy to get strangled when eating   Dyspnea 2014   Extrinsic asthma    WITH ASTHMA ATTACK   Fibroid 1980   GERD (gastroesophageal reflux disease)    Heart murmur    History of migraine    History of seizure    with stroke   Hx gestational diabetes    Hyperlipidemia    Hypertension 2014   Inguinal hernia 1959   Malaise and fatigue 2014   Non-IgE mediated allergic asthma 2014   Obesity    Pelvic pain    Pregnancy, high-risk 1985   Stroke (HCC)  04/20/2021   (CVA) of right basal ganglia   Tonsillitis 1968   Uterine fibroid 1980   Visual field defect    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   Past Surgical History:  Procedure Laterality Date   ABDOMINAL AORTOGRAM W/LOWER EXTREMITY N/A 02/21/2022   Procedure: ABDOMINAL AORTOGRAM W/LOWER EXTREMITY;  Surgeon: Maeola Harman, MD;  Location: [facility] INVASIVE CV LAB;  Service: Cardiovascular;  Laterality: N/A;   AMPUTATION Right 05/18/2022   Procedure: RIGHT BELOW KNEE AMPUTATION;  Surgeon: Nadara Mustard, MD;  Location: [facility] OR;  Service: Orthopedics;  Laterality: Right;   AMPUTATION TOE Right 04/15/2022   Procedure: AMPUTATION  LST TOERIGHT FOOT;  Surgeon: Edwin Cap, DPM;  Location: [facility] ORS;  Service: Podiatry;  Laterality: Right;   APPENDECTOMY  1959   BASCILIC VEIN TRANSPOSITION Right 04/30/2021   Procedure: RIGHT FIRST STAGE BASCILIC VEIN TRANSPOSITION;  Surgeon: Chuck Hint, MD;  Location: [facility] OR;  Service: Vascular;  Laterality: Right;   CESAREAN SECTION  1985   COLONOSCOPY     ESOPHAGOGASTRODUODENOSCOPY (EGD)  WITH PROPOFOL N/A 04/18/2021   Procedure: ESOPHAGOGASTRODUODENOSCOPY (EGD) WITH PROPOFOL;  Surgeon: Iva Boop, MD;  Location: [facility] ENDOSCOPY;  Service: Endoscopy;  Laterality: N/A;   EYE SURGERY     bilateral cataract    FLEXIBLE SIGMOIDOSCOPY N/A 04/18/2021   Procedure: FLEXIBLE SIGMOIDOSCOPY;  Surgeon: Iva Boop, MD;  Location: [facility] ENDOSCOPY;  Service: Endoscopy;  Laterality: N/A;   HERNIA REPAIR  1959   IR FLUORO GUIDE CV LINE RIGHT  03/18/2021   IR US GUIDE VASC ACCESS RIGHT  03/18/2021   LEFT HEART CATH AND CORONARY ANGIOGRAPHY N/A 04/04/2017   Procedure: Left Heart Cath and Coronary Angiography;  Surgeon: Lyn Records, MD;  Location: [facility] INVASIVE CV LAB;  Service: Cardiovascular;  Laterality: N/A;   MYOMECTOMY  1980, 2004, 2007   PERIPHERAL VASCULAR THROMBECTOMY  02/21/2022   Procedure: PERIPHERAL VASCULAR THROMBECTOMY;  Surgeon: Maeola Harman, MD;   Location: [facility] INVASIVE CV LAB;  Service: Cardiovascular;;  Right Tibial   RHINOPLASTY  1971   ROTATOR CUFF REPAIR  2003   SURGICAL REPAIR OF HEMORRHAGE  2015   TONSILLECTOMY  1968   Patient Active Problem List   Diagnosis Date Noted   Hx of right BKA (HCC) 03/10/2023   CHF (congestive heart failure) (HCC) 02/10/2023   Dyspnea 02/09/2023   Acute respiratory distress 02/09/2023   Urinary tract infection without hematuria    Diabetes mellitus due to underlying condition with hypoglycemia without coma, with long-term current use of insulin (HCC)    Seizure disorder (HCC) 06/22/2022   Acute respiratory failure (HCC)    Unresponsiveness    Pressure ulcer 06/07/2022   Acute stroke due to ischemia (HCC) 06/03/2022   Chest pain 06/01/2022   History of CVA (cerebrovascular accident) 06/01/2022   Seizure (HCC)    Encephalopathy    Below-knee amputation of right lower extremity (HCC)    Leukocytosis    Foot infection 05/10/2022   PAD (peripheral artery disease) (HCC) 05/10/2022   Gangrene of right foot (HCC) 04/15/2022   Left hemiparesis (HCC) 09/24/2021   Abnormality of gait 09/24/2021   Pain of right hand 08/06/2021   Steal syndrome of dialysis vascular access (HCC) 08/06/2021   Subclavian steal syndrome of right subclavian artery 06/25/2021   Right hand weakness 06/25/2021   Stroke-like symptoms 05/06/2021   Bandemia    Cerebrovascular accident (CVA) of right basal ganglia (HCC) 04/20/2021   Hemorrhoids    Basal ganglia infarction (HCC) 04/16/2021   ESRD on dialysis (HCC) 04/16/2021   Hypertensive urgency 03/10/2021   Hilar enlargement    Anemia of chronic disease    Chronic diastolic CHF (congestive heart failure) (HCC) 03/09/2021   CKD (chronic kidney disease), stage V (HCC) 03/08/2021   Diabetic retinopathy (HCC) 03/08/2021   Hyperglycemia due to type 2 diabetes mellitus (HCC) 03/08/2021   Pure hypercholesterolemia 03/08/2021   Anemia in chronic kidney disease 09/24/2020    Uncontrolled type 2 diabetes mellitus with hyperglycemia (HCC) 03/19/2019   Osteopenia 09/04/2018   Migraine 09/04/2018   Asthma 09/04/2018   Pseudophakia of both eyes 07/17/2018   Pseudophakia of left eye 07/03/2018   Open angle with borderline findings and high glaucoma risk in left eye 07/03/2018   Age-related nuclear cataract of right eye 07/03/2018   CAD (coronary artery disease), native coronary artery 04/27/2017   Abnormal nuclear stress test 04/04/2017   Shortness of breath 03/09/2017   Appendicitis    Age-related hypermature cataract of both eyes 12/20/2016   Uterine leiomyoma 11/27/2012  Dyslipidemia (high LDL; low HDL) 11/25/2011   Obesity (BMI 30-39.9) 11/25/2011   Hypertensive disorder 11/21/2011   Type 2 diabetes mellitus (HCC) 11/21/2011   Abnormal EKG 11/21/2011    ONSET DATE: 12/29/2022 prosthesis delivery  REFERRING DIAG: Z61.096E (ICD-10-CM) - Below-knee amputation of right lower extremity   THERAPY DIAG:  Unsteadiness on feet  Other abnormalities of gait and mobility  Muscle weakness (generalized)  Hemiplegia and hemiparesis following cerebral infarction affecting left non-dominant side (HCC)  Chronic pain of both knees  Non-pressure chronic ulcer of skin of other sites with other specified severity (HCC)  Rationale for Evaluation and Treatment: Rehabilitation  SUBJECTIVE:   SUBJECTIVE STATEMENT: She is wearing prosthesis most of awake hours.  She walked with CNA but carpet makes it hard.    PERTINENT HISTORY: CVA / left hemiparesis due to right basal ganglia infarct 7/22, right hip bursitis, anxiety, dCHF, ESRD on HD, HTN, DM, CAD  PAIN:  Are you having pain?  Yes: NPRS scale: 0/10 sitting &  2/10 standing  Pain location: Rt knee Pain description: throbbing Aggravating factors: standing, walking Relieving factors: Voltarin & massage  PRECAUTIONS: Fall and Other: NO BP RUE  WEIGHT BEARING RESTRICTIONS: No  FALLS: Has patient fallen in last  6 months? No  LIVING ENVIRONMENT: Lives with: lives with their spouse and CNAs 24 hrs 7 days / wk Lives in: House Home Access: Ramped entrance Home layout: Multi level, Full bath on main level, and Able to live on main level with bedroom and bathroom Stairs: Yes: Internal: 16 steps; on left going up and External: 4 steps; on right going up Has following equipment at home: Single point cane, Walker - 2 wheeled, Wheelchair (manual), and shower chair  PLOF: Independent with household mobility with device and Independent with community mobility with device  PATIENT GOALS: walk with prosthesis, stand & transfer.   OBJECTIVE:  COGNITION: Overall cognitive status: History of cognitive impairments - at baseline  POSTURE: rounded shoulders, forward head, decreased lumbar lordosis, increased thoracic kyphosis, and flexed trunk   LOWER EXTREMITY ROM: Eval / 01/05/2023:    BLEs PROM WFL except unable to assess hip ext  (Blank rows = not tested)  LOWER EXTREMITY MMT:  MMT Right eval Right 03/06/23  Hip flexion 3-/5 3-/5  Hip extension 3-/5 3-/5  Hip abduction 3-/5 3-/5  Hip adduction    Hip internal rotation    Hip external rotation    Knee flexion 3-/5 3-/5  Knee extension 3-/5 3-/5  Ankle dorsiflexion NA NA  Ankle plantarflexion NA NA  Ankle inversion NA NA  Ankle eversion NA NA  03/06/2023: LLE hemiparesis but able to advance during gait without assistance.   Eval / 01/05/2023: left LE & UE hemiparesis.  In gait slides to advance LLE and knee / hip instability in stance.  (Blank rows = not tested)  TRANSFERS: 03/06/2023:  Sit to stand w/c & 18" chair with armrests to RW with modA. Stand to sit with minA. Stand-pivot  transfer with RW with modA / constant cueing.  02/07/2023: Sit to stand w/c or 18" chair with armrests to RW with modA. Stand to sit with minA. Both require constant cues. Stand-pivot transfer with RW with modA / constant cueing. (2nd person for  safety)   01/05/2023 / Eval: Sit to stand: Max A pulling BUEs on //bars from w/c Stand to sit: Min A using BUEs //bars to control descent Lateral scoot transfers: Mod A w/c to / from mat table level & adjacent  GAIT: 03/06/2023: Pt amb 5' with RW with +2 modA including 90* turn to chair.  02/07/2023: Pt amb 5' with RW with +2 modA including 90* turn to chair.   01/05/2023 / Eval: Gait pattern: step to pattern, decreased step length- Right, decreased step length- Left, decreased stance time- Right, decreased stance time- Left, decreased hip/knee flexion- Left, decreased ankle dorsiflexion- Left, Right hip hike, Left hip hike, knee flexed in stance- Left, antalgic, lateral hip instability, trunk flexed, wide BOS, and poor foot clearance- Left Distance walked: 3' Assistive device utilized: // bars & RLE TTA prosthesis Level of assistance: Total A / 2 person Comments:  LLE with poor clearance / slides foot forward and knee / hip instability in stance.   FUNCTIONAL TESTs:  03/06/2023: static stance with RW support 60 sec with minA.  Eval / 01/05/2023:  Standing in //bars for 30 sec with modA.    CURRENT PROSTHETIC WEAR ASSESSMENT: 03/06/2023: Patient's husband & CNA correctly don & doff her prosthesis. Pt is dependent requiring maxA to don or doff.  She reports wearing prosthesis 5 hrs 2x/day for 10 hours total on non-dialysis days and up to 8 hours on dialysis days.  Her limb has scab on lateral incision that appears to be internal suture working way out. No signs of infection. Her residual limb appearance is dry skin, normal temperature, limited to no hair growth, cylindrical shape.   Eval / 01/05/2023:  Patient is dependent with: skin check, residual limb care, care of non-amputated limb, prosthetic cleaning, ply sock cleaning, correct ply sock adjustment, proper wear schedule/adjustment, and proper weight-bearing schedule/adjustment Donning prosthesis: Total A / family & CNA requires 100%  cueing Doffing prosthesis: Total A / family & CNA requires 100% cueing Prosthetic wear tolerance: 1-1.5 hours, 1x/day, 6 of 7 days since delivery Prosthetic weight bearing tolerance: 3 minutes during eval with c/o knee pain Edema: pitting Residual limb condition: lateral incision with 3mm X 6mm long scabs with serous drainage, arrived with bandaid over wound,  dry skin, normal temperature & color, cylindrical shape Prosthetic description: silicon liner with pin lock suspension, total contact socket, SACH foot K code/activity level with prosthetic use: Level 1    TODAY'S TREATMENT:                                                                                                                             DATE:  04/11/2023: Prosthetic Training with TTA prosthesis Sit to stand with RW and patient pushing off wheelchair with min to mod assist, requires cues for set up, hand placement, and sequence, X 6 reps total Patient ambulated 10 feet X 3 with rolling walker and min A once ambulating PT educated pt & husband in adjusting ply socks with donning ease, number of clicks in sitting, perceived height in gait, pressure in gait and depth of patella after walking so limb is seated in socket. With too few, too many and correct ply socks.  Pt &  husband verbalized understanding.  Stand pivot transfers from Wheelchair to leg press with RW and mod A overall for turning, and RW management and then once sitting on leg press chair, max A for management of placing legs on leg press platform moving from sittting to supine at 45 deg incline. Leg press DL 98# 1X91, then single leg 31# X10 each side with manual assistance at left knee to reduce knee varus/hip IT   04/06/23 Prosthetic Training with TTA prosthesis Sit to stand with RW and patient pushing off wheelchair with min to mod assist, requires cues for set up, hand placement, and sequence, X 6 reps total Patient ambulated 20 feet X 4 with rolling walker and min  A once ambulating Step ups in // bars with bilat UE support onto 4 inch step X 1 rep each leg with min to mod overall assistance needed. Stand pivot transfers from Wheelchair to leg press with RW and mod A overall for turning, and RW management and then once sitting on leg press chair, max A for management of placing legs on leg press platform moving from sittting to supine at 45 deg incline. Leg press DL 47# [id number], then single leg 31# X10 each side with manual assistance at left knee to reduce knee varus/hip IT Max A to transfer from leg press supine at 45 deg to sitting on edge of leg press. Then mod A to transfer from leg press to wheel chair at 90 deg.    03/30/23 Prosthetic Training with TTA prosthesis Sit to stand with RW and patient pushing off wheelchair with mod assist, requires cues for set up, hand placement, and sequence, X 6 reps total Patient ambulated 10 feet, then 25 feet  with rolling walker and min A once ambulating one extra person for chair follow (CNA). Stand pivot transfers from Wheelchair to leg press with RW and mod A overall for turning, and RW management and then once sitting on leg press chair, max A for management of placing legs on leg press platform moving from sittting to supine at 45 deg incline. Leg press DL 62# 1H08, then single leg 25# 2X10 each side with manual assistance at left knee to reduce knee varus/hip IR Max A to transfer from leg press supine at 45 deg to sitting on edge of leg press. Then mod A to transfer from leg press to wheel chair at 90 deg.  Patient ambulated another 20 feet, then 25 feet with rolling walker and min A from husband while PT was CGA just in case and one extra person for chair follow (CNA).d    HOME EXERCISE PROGRAM:  ASSESSMENT:  CLINICAL IMPRESSION: Patient and husband seem to understand adjusting ply socks based on limb volume which will change with dialysis, limb shrinkage and pushing fluid out of limb with standing/gait.   Correct ply socks will effect her comfort.   PT recommending to continue with current PT plan.  OBJECTIVE IMPAIRMENTS: Abnormal gait, decreased activity tolerance, decreased balance, decreased cognition, decreased coordination, decreased endurance, decreased knowledge of condition, decreased knowledge of use of DME, decreased mobility, difficulty walking, decreased ROM, decreased strength, postural dysfunction, prosthetic dependency , obesity, and pain.   ACTIVITY LIMITATIONS: standing, stairs, transfers, locomotion level, and safe prosthesis use.  PARTICIPATION LIMITATIONS: community activity, church, and household mobility  PERSONAL FACTORS: Age, Fitness, Past/current experiences, Time since onset of injury/illness/exacerbation, and 3+ comorbidities: see PMH  are also affecting patient's functional outcome.   REHAB POTENTIAL: Good  CLINICAL DECISION MAKING: Evolving/moderate  complexity  EVALUATION COMPLEXITY: Moderate   GOALS: Goals reviewed with patient? Yes  SHORT TERM GOALS: Target date: 04/06/2023  Patient reports how to correctly adjust dialysis weight and and weigh out to include prosthesis. Baseline: SEE OBJECTIVE DATA Goal status: Met 04/04/2023 2.  Patient tolerates prosthesis >90% of awake hrs/day without increase in skin issues or limb / knee pain <5/10 with standing. Baseline: SEE OBJECTIVE DATA Goal status:  Met 04/04/2023  3.  Patient stand pivot transfer with RW with minA. Baseline: SEE OBJECTIVE DATA Goal status: partially MET 04/04/2023  4. Patient ambulates 10' including turning 90* to position to sit with RW with modA of one person.  Baseline: SEE OBJECTIVE DATA Goal status: Met 04/04/2023    LONG TERM GOALS: Target date: 06/01/2023  Patient & family / CNA demonstrates & verbalized understanding of prosthetic care to enable safe utilization of prosthesis. Baseline: SEE OBJECTIVE DATA Goal status: ongoing 03/06/2023  Patient tolerates prosthesis wear >90% of  awake hours without skin or limb pain issues and knee pain </= 4/10.  Baseline: SEE OBJECTIVE DATA Goal status:  ongoing 03/06/2023  Stand balance with walker: static 2 min with supervision to enable ability for standing ADLS. Baseline: SEE OBJECTIVE DATA Goal status: ongoing 03/06/2023  Patient ambulates >50' with RW & prosthesis only with family / CNA assist safely.  Baseline: SEE OBJECTIVE DATA Goal status:  ongoing 03/06/2023  Patient transfers stand pivot including sit to/from stand with RW with minA or less.  Baseline: SEE OBJECTIVE DATA Goal status:  updated 03/06/2023   PLAN:  PT FREQUENCY: 2x/week  PT DURATION: 13 weeks / 90 days  PLANNED INTERVENTIONS: Therapeutic exercises, Therapeutic activity, Neuromuscular re-education, Balance training, Gait training, Patient/Family education, Self Care, Vestibular training, Prosthetic training, DME instructions, and Manual therapy  PLAN FOR NEXT SESSION: per pt & husband request work on scooting/squat pivot transfers without RW,  Work on patient walking between 2 chairs and decreasing need for wheelchair to be followed behind patient.  Continue gait and transfers including stand pivot with rolling walker, leg strength, balance    Vladimir Faster, PT, DPT 04/11/2023, 4:11 PM

## 2023-04-13 ENCOUNTER — Ambulatory Visit: Payer: Medicare PPO | Admitting: Physical Therapy

## 2023-04-13 ENCOUNTER — Encounter: Payer: Self-pay | Admitting: Physical Therapy

## 2023-04-13 DIAGNOSIS — M25562 Pain in left knee: Secondary | ICD-10-CM

## 2023-04-13 DIAGNOSIS — M6281 Muscle weakness (generalized): Secondary | ICD-10-CM | POA: Diagnosis not present

## 2023-04-13 DIAGNOSIS — I69354 Hemiplegia and hemiparesis following cerebral infarction affecting left non-dominant side: Secondary | ICD-10-CM

## 2023-04-13 DIAGNOSIS — M25561 Pain in right knee: Secondary | ICD-10-CM

## 2023-04-13 DIAGNOSIS — R2681 Unsteadiness on feet: Secondary | ICD-10-CM

## 2023-04-13 DIAGNOSIS — G8929 Other chronic pain: Secondary | ICD-10-CM

## 2023-04-13 DIAGNOSIS — R2689 Other abnormalities of gait and mobility: Secondary | ICD-10-CM | POA: Diagnosis not present

## 2023-04-13 NOTE — Therapy (Signed)
OUTPATIENT PHYSICAL THERAPY TREATMENT  Patient Name: Carolyn Russo MRN: [id number] DOB:1951/06/18, 72 y.o., female Today's Date: 04/13/2023  PCP: No PCP on file REFERRING PROVIDER: Aldean Baker, MD     END OF SESSION:  PT End of Session - 04/13/23 1651     Visit Number 18    Number of Visits 36    Date for PT Re-Evaluation 06/01/23    Authorization Type [employer] PPO    Authorization Time Period $20 co-pay, Authorization #[id number]  Tracking #[id number] 12 PT VISITS 5/13-06/01/23    Authorization - Visit Number 5    Authorization - Number of Visits 12    Progress Note Due on Visit 20    PT Start Time 1600    PT Stop Time 1645    PT Time Calculation (min) 45 min    Equipment Utilized During Treatment Gait belt    Activity Tolerance Patient tolerated treatment well;Patient limited by fatigue;Patient limited by pain    Behavior During Therapy WFL for tasks assessed/performed                 Past Medical History:  Diagnosis Date   Acute GI bleeding    Allergy    Anemia    Anterior chest wall pain    Appendicitis 1965   Asthma    Body mass index 37.0-37.9, adult    Breast pain    Cataract    both eyes   CHF (congestive heart failure) (HCC)    Chronic kidney disease    stage 5 - on dialysis   Cognitive change 04/20/2021   r/t cva 03/2821   Complication of anesthesia    Memory loss after general   Dehydration 2014   Deviated septum 1971   Diabetes mellitus    Dysphagia due to old stroke    easy to get strangled when eating   Dyspnea 2014   Extrinsic asthma    WITH ASTHMA ATTACK   Fibroid 1980   GERD (gastroesophageal reflux disease)    Heart murmur    History of migraine    History of seizure    with stroke   Hx gestational diabetes    Hyperlipidemia    Hypertension 2014   Inguinal hernia 1959   Malaise and fatigue 2014   Non-IgE mediated allergic asthma 2014   Obesity    Pelvic pain    Pregnancy, high-risk 1985   Stroke (HCC)  04/20/2021   (CVA) of right basal ganglia   Tonsillitis 1968   Uterine fibroid 1980   Visual field defect    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   Past Surgical History:  Procedure Laterality Date   ABDOMINAL AORTOGRAM W/LOWER EXTREMITY N/A 02/21/2022   Procedure: ABDOMINAL AORTOGRAM W/LOWER EXTREMITY;  Surgeon: Maeola Harman, MD;  Location: [facility] INVASIVE CV LAB;  Service: Cardiovascular;  Laterality: N/A;   AMPUTATION Right 05/18/2022   Procedure: RIGHT BELOW KNEE AMPUTATION;  Surgeon: Nadara Mustard, MD;  Location: [facility] OR;  Service: Orthopedics;  Laterality: Right;   AMPUTATION TOE Right 04/15/2022   Procedure: AMPUTATION  LST TOERIGHT FOOT;  Surgeon: Edwin Cap, DPM;  Location: [facility] ORS;  Service: Podiatry;  Laterality: Right;   APPENDECTOMY  1959   BASCILIC VEIN TRANSPOSITION Right 04/30/2021   Procedure: RIGHT FIRST STAGE BASCILIC VEIN TRANSPOSITION;  Surgeon: Chuck Hint, MD;  Location: [facility] OR;  Service: Vascular;  Laterality: Right;   CESAREAN SECTION  1985   COLONOSCOPY     ESOPHAGOGASTRODUODENOSCOPY (EGD)  WITH PROPOFOL N/A 04/18/2021   Procedure: ESOPHAGOGASTRODUODENOSCOPY (EGD) WITH PROPOFOL;  Surgeon: Iva Boop, MD;  Location: [facility] ENDOSCOPY;  Service: Endoscopy;  Laterality: N/A;   EYE SURGERY     bilateral cataract    FLEXIBLE SIGMOIDOSCOPY N/A 04/18/2021   Procedure: FLEXIBLE SIGMOIDOSCOPY;  Surgeon: Iva Boop, MD;  Location: [facility] ENDOSCOPY;  Service: Endoscopy;  Laterality: N/A;   HERNIA REPAIR  1959   IR FLUORO GUIDE CV LINE RIGHT  03/18/2021   IR US GUIDE VASC ACCESS RIGHT  03/18/2021   LEFT HEART CATH AND CORONARY ANGIOGRAPHY N/A 04/04/2017   Procedure: Left Heart Cath and Coronary Angiography;  Surgeon: Lyn Records, MD;  Location: [facility] INVASIVE CV LAB;  Service: Cardiovascular;  Laterality: N/A;   MYOMECTOMY  1980, 2004, 2007   PERIPHERAL VASCULAR THROMBECTOMY  02/21/2022   Procedure: PERIPHERAL VASCULAR THROMBECTOMY;  Surgeon: Maeola Harman, MD;   Location: [facility] INVASIVE CV LAB;  Service: Cardiovascular;;  Right Tibial   RHINOPLASTY  1971   ROTATOR CUFF REPAIR  2003   SURGICAL REPAIR OF HEMORRHAGE  2015   TONSILLECTOMY  1968   Patient Active Problem List   Diagnosis Date Noted   Hx of right BKA (HCC) 03/10/2023   CHF (congestive heart failure) (HCC) 02/10/2023   Dyspnea 02/09/2023   Acute respiratory distress 02/09/2023   Urinary tract infection without hematuria    Diabetes mellitus due to underlying condition with hypoglycemia without coma, with long-term current use of insulin (HCC)    Seizure disorder (HCC) 06/22/2022   Acute respiratory failure (HCC)    Unresponsiveness    Pressure ulcer 06/07/2022   Acute stroke due to ischemia (HCC) 06/03/2022   Chest pain 06/01/2022   History of CVA (cerebrovascular accident) 06/01/2022   Seizure (HCC)    Encephalopathy    Below-knee amputation of right lower extremity (HCC)    Leukocytosis    Foot infection 05/10/2022   PAD (peripheral artery disease) (HCC) 05/10/2022   Gangrene of right foot (HCC) 04/15/2022   Left hemiparesis (HCC) 09/24/2021   Abnormality of gait 09/24/2021   Pain of right hand 08/06/2021   Steal syndrome of dialysis vascular access (HCC) 08/06/2021   Subclavian steal syndrome of right subclavian artery 06/25/2021   Right hand weakness 06/25/2021   Stroke-like symptoms 05/06/2021   Bandemia    Cerebrovascular accident (CVA) of right basal ganglia (HCC) 04/20/2021   Hemorrhoids    Basal ganglia infarction (HCC) 04/16/2021   ESRD on dialysis (HCC) 04/16/2021   Hypertensive urgency 03/10/2021   Hilar enlargement    Anemia of chronic disease    Chronic diastolic CHF (congestive heart failure) (HCC) 03/09/2021   CKD (chronic kidney disease), stage V (HCC) 03/08/2021   Diabetic retinopathy (HCC) 03/08/2021   Hyperglycemia due to type 2 diabetes mellitus (HCC) 03/08/2021   Pure hypercholesterolemia 03/08/2021   Anemia in chronic kidney disease 09/24/2020    Uncontrolled type 2 diabetes mellitus with hyperglycemia (HCC) 03/19/2019   Osteopenia 09/04/2018   Migraine 09/04/2018   Asthma 09/04/2018   Pseudophakia of both eyes 07/17/2018   Pseudophakia of left eye 07/03/2018   Open angle with borderline findings and high glaucoma risk in left eye 07/03/2018   Age-related nuclear cataract of right eye 07/03/2018   CAD (coronary artery disease), native coronary artery 04/27/2017   Abnormal nuclear stress test 04/04/2017   Shortness of breath 03/09/2017   Appendicitis    Age-related hypermature cataract of both eyes 12/20/2016   Uterine leiomyoma 11/27/2012  Dyslipidemia (high LDL; low HDL) 11/25/2011   Obesity (BMI 30-39.9) 11/25/2011   Hypertensive disorder 11/21/2011   Type 2 diabetes mellitus (HCC) 11/21/2011   Abnormal EKG 11/21/2011    ONSET DATE: 12/29/2022 prosthesis delivery  REFERRING DIAG: W09.811B (ICD-10-CM) - Below-knee amputation of right lower extremity   THERAPY DIAG:  Unsteadiness on feet  Other abnormalities of gait and mobility  Muscle weakness (generalized)  Hemiplegia and hemiparesis following cerebral infarction affecting left non-dominant side (HCC)  Chronic pain of both knees  Rationale for Evaluation and Treatment: Rehabilitation  SUBJECTIVE:   SUBJECTIVE STATEMENT: She says her prosthesis feels like it weights a lot.   PERTINENT HISTORY: CVA / left hemiparesis due to right basal ganglia infarct 7/22, right hip bursitis, anxiety, dCHF, ESRD on HD, HTN, DM, CAD  PAIN:  Are you having pain?  Yes: NPRS scale: 0/10 sitting, complaints of pain with standing but does not rate Pain location: Rt knee Pain description: throbbing Aggravating factors: standing, walking Relieving factors: Voltarin & massage  PRECAUTIONS: Fall and Other: NO BP RUE  WEIGHT BEARING RESTRICTIONS: No  FALLS: Has patient fallen in last 6 months? No  LIVING ENVIRONMENT: Lives with: lives with their spouse and CNAs 24 hrs 7 days /  wk Lives in: House Home Access: Ramped entrance Home layout: Multi level, Full bath on main level, and Able to live on main level with bedroom and bathroom Stairs: Yes: Internal: 16 steps; on left going up and External: 4 steps; on right going up Has following equipment at home: Single point cane, Walker - 2 wheeled, Wheelchair (manual), and shower chair  PLOF: Independent with household mobility with device and Independent with community mobility with device  PATIENT GOALS: walk with prosthesis, stand & transfer.   OBJECTIVE:  COGNITION: Overall cognitive status: History of cognitive impairments - at baseline  POSTURE: rounded shoulders, forward head, decreased lumbar lordosis, increased thoracic kyphosis, and flexed trunk   LOWER EXTREMITY ROM: Eval / 01/05/2023:    BLEs PROM WFL except unable to assess hip ext  (Blank rows = not tested)  LOWER EXTREMITY MMT:  MMT Right eval Right 03/06/23  Hip flexion 3-/5 3-/5  Hip extension 3-/5 3-/5  Hip abduction 3-/5 3-/5  Hip adduction    Hip internal rotation    Hip external rotation    Knee flexion 3-/5 3-/5  Knee extension 3-/5 3-/5  Ankle dorsiflexion NA NA  Ankle plantarflexion NA NA  Ankle inversion NA NA  Ankle eversion NA NA  03/06/2023: LLE hemiparesis but able to advance during gait without assistance.   Eval / 01/05/2023: left LE & UE hemiparesis.  In gait slides to advance LLE and knee / hip instability in stance.  (Blank rows = not tested)  TRANSFERS: 03/06/2023:  Sit to stand w/c & 18" chair with armrests to RW with modA. Stand to sit with minA. Stand-pivot  transfer with RW with modA / constant cueing.  02/07/2023: Sit to stand w/c or 18" chair with armrests to RW with modA. Stand to sit with minA. Both require constant cues. Stand-pivot transfer with RW with modA / constant cueing. (2nd person for safety)   01/05/2023 / Eval: Sit to stand: Max A pulling BUEs on //bars from w/c Stand to sit: Min A using BUEs  //bars to control descent Lateral scoot transfers: Mod A w/c to / from mat table level & adjacent   GAIT: 03/06/2023: Pt amb 5' with RW with +2 modA including 90* turn to chair.  02/07/2023:  Pt amb 5' with RW with +2 modA including 90* turn to chair.   01/05/2023 / Eval: Gait pattern: step to pattern, decreased step length- Right, decreased step length- Left, decreased stance time- Right, decreased stance time- Left, decreased hip/knee flexion- Left, decreased ankle dorsiflexion- Left, Right hip hike, Left hip hike, knee flexed in stance- Left, antalgic, lateral hip instability, trunk flexed, wide BOS, and poor foot clearance- Left Distance walked: 3' Assistive device utilized: // bars & RLE TTA prosthesis Level of assistance: Total A / 2 person Comments:  LLE with poor clearance / slides foot forward and knee / hip instability in stance.   FUNCTIONAL TESTs:  03/06/2023: static stance with RW support 60 sec with minA.  Eval / 01/05/2023:  Standing in //bars for 30 sec with modA.    CURRENT PROSTHETIC WEAR ASSESSMENT: 03/06/2023: Patient's husband & CNA correctly don & doff her prosthesis. Pt is dependent requiring maxA to don or doff.  She reports wearing prosthesis 5 hrs 2x/day for 10 hours total on non-dialysis days and up to 8 hours on dialysis days.  Her limb has scab on lateral incision that appears to be internal suture working way out. No signs of infection. Her residual limb appearance is dry skin, normal temperature, limited to no hair growth, cylindrical shape.   Eval / 01/05/2023:  Patient is dependent with: skin check, residual limb care, care of non-amputated limb, prosthetic cleaning, ply sock cleaning, correct ply sock adjustment, proper wear schedule/adjustment, and proper weight-bearing schedule/adjustment Donning prosthesis: Total A / family & CNA requires 100% cueing Doffing prosthesis: Total A / family & CNA requires 100% cueing Prosthetic wear tolerance: 1-1.5 hours,  1x/day, 6 of 7 days since delivery Prosthetic weight bearing tolerance: 3 minutes during eval with c/o knee pain Edema: pitting Residual limb condition: lateral incision with 3mm X 6mm long scabs with serous drainage, arrived with bandaid over wound,  dry skin, normal temperature & color, cylindrical shape Prosthetic description: silicon liner with pin lock suspension, total contact socket, SACH foot K code/activity level with prosthetic use: Level 1    TODAY'S TREATMENT:                                                                                                                             DATE:  04/13/2023: Prosthetic Training with TTA prosthesis Sit to stand with RW and patient pushing off wheelchair with min to mod assist, requires cues for set up, hand placement, and sequence, X 4 reps total Patient ambulated 10 feet X 3 with rolling walker and min A once ambulating and then performed 90 deg turns to chair.  Stand pivot transfer from chair with arm rests to other chair with armrests 4 reps total, 2 reps to Rt, and 2 reps to left. She appears to have easier time moving to her Rt compared to left. Requires overall mod A to her Rt and mod to max to her Lt.  Stand pivot transfers  from Wheelchair to leg press with RW and mod A overall for turning, and RW management and then once sitting on leg press chair, max A for management of placing legs on leg press platform moving from sittting to supine at 45 deg incline. Leg press DL 40# [id number], then single leg 31# X10 each side with manual assistance at left knee to reduce knee varus/hip rotation  04/11/2023: Prosthetic Training with TTA prosthesis Sit to stand with RW and patient pushing off wheelchair with min to mod assist, requires cues for set up, hand placement, and sequence, X 6 reps total Patient ambulated 10 feet X 3 with rolling walker and min A once ambulating PT educated pt & husband in adjusting ply socks with donning ease, number of  clicks in sitting, perceived height in gait, pressure in gait and depth of patella after walking so limb is seated in socket. With too few, too many and correct ply socks.  Pt & husband verbalized understanding.  Stand pivot transfers from Wheelchair to leg press with RW and mod A overall for turning, and RW management and then once sitting on leg press chair, max A for management of placing legs on leg press platform moving from sittting to supine at 45 deg incline. Leg press DL 91# [id number], then single leg 31# X10 each side with manual assistance at left knee to reduce knee varus/hip IT   04/06/23 Prosthetic Training with TTA prosthesis Sit to stand with RW and patient pushing off wheelchair with min to mod assist, requires cues for set up, hand placement, and sequence, X 6 reps total Patient ambulated 20 feet X 4 with rolling walker and min A once ambulating Step ups in // bars with bilat UE support onto 4 inch step X 1 rep each leg with min to mod overall assistance needed. Stand pivot transfers from Wheelchair to leg press with RW and mod A overall for turning, and RW management and then once sitting on leg press chair, max A for management of placing legs on leg press platform moving from sittting to supine at 45 deg incline. Leg press DL 95# [id number], then single leg 31# X10 each side with manual assistance at left knee to reduce knee varus/hip IT Max A to transfer from leg press supine at 45 deg to sitting on edge of leg press. Then mod A to transfer from leg press to wheel chair at 90 deg.    03/30/23 Prosthetic Training with TTA prosthesis Sit to stand with RW and patient pushing off wheelchair with mod assist, requires cues for set up, hand placement, and sequence, X 6 reps total Patient ambulated 10 feet, then 25 feet  with rolling walker and min A once ambulating one extra person for chair follow (CNA). Stand pivot transfers from Wheelchair to leg press with RW and mod A overall for turning,  and RW management and then once sitting on leg press chair, max A for management of placing legs on leg press platform moving from sittting to supine at 45 deg incline. Leg press DL 08# [id number], then single leg 25# 2X10 each side with manual assistance at left knee to reduce knee varus/hip IR Max A to transfer from leg press supine at 45 deg to sitting on edge of leg press. Then mod A to transfer from leg press to wheel chair at 90 deg.  Patient ambulated another 20 feet, then 25 feet with rolling walker and min A from husband while PT was CGA just in  case and one extra person for chair follow (CNA).d    HOME EXERCISE PROGRAM:  ASSESSMENT:  CLINICAL IMPRESSION: PT worked more on stand pivot transfers with her. She did appear to improve with this requiring less overall assistance but also seems to have less difficulty moving to her Rt compared to left. We will continue to work to improve this to reduce care giver burden.   OBJECTIVE IMPAIRMENTS: Abnormal gait, decreased activity tolerance, decreased balance, decreased cognition, decreased coordination, decreased endurance, decreased knowledge of condition, decreased knowledge of use of DME, decreased mobility, difficulty walking, decreased ROM, decreased strength, postural dysfunction, prosthetic dependency , obesity, and pain.   ACTIVITY LIMITATIONS: standing, stairs, transfers, locomotion level, and safe prosthesis use.  PARTICIPATION LIMITATIONS: community activity, church, and household mobility  PERSONAL FACTORS: Age, Fitness, Past/current experiences, Time since onset of injury/illness/exacerbation, and 3+ comorbidities: see PMH  are also affecting patient's functional outcome.   REHAB POTENTIAL: Good  CLINICAL DECISION MAKING: Evolving/moderate complexity  EVALUATION COMPLEXITY: Moderate   GOALS: Goals reviewed with patient? Yes  SHORT TERM GOALS: Target date: 04/06/2023  Patient reports how to correctly adjust dialysis weight and  and weigh out to include prosthesis. Baseline: SEE OBJECTIVE DATA Goal status: Met 04/04/2023 2.  Patient tolerates prosthesis >90% of awake hrs/day without increase in skin issues or limb / knee pain <5/10 with standing. Baseline: SEE OBJECTIVE DATA Goal status:  Met 04/04/2023  3.  Patient stand pivot transfer with RW with minA. Baseline: SEE OBJECTIVE DATA Goal status: partially MET 04/04/2023  4. Patient ambulates 10' including turning 90* to position to sit with RW with modA of one person.  Baseline: SEE OBJECTIVE DATA Goal status: Met 04/04/2023    LONG TERM GOALS: Target date: 06/01/2023  Patient & family / CNA demonstrates & verbalized understanding of prosthetic care to enable safe utilization of prosthesis. Baseline: SEE OBJECTIVE DATA Goal status: ongoing 03/06/2023  Patient tolerates prosthesis wear >90% of awake hours without skin or limb pain issues and knee pain </= 4/10.  Baseline: SEE OBJECTIVE DATA Goal status:  ongoing 03/06/2023  Stand balance with walker: static 2 min with supervision to enable ability for standing ADLS. Baseline: SEE OBJECTIVE DATA Goal status: ongoing 03/06/2023  Patient ambulates >50' with RW & prosthesis only with family / CNA assist safely.  Baseline: SEE OBJECTIVE DATA Goal status:  ongoing 03/06/2023  Patient transfers stand pivot including sit to/from stand with RW with minA or less.  Baseline: SEE OBJECTIVE DATA Goal status:  updated 03/06/2023   PLAN:  PT FREQUENCY: 2x/week  PT DURATION: 13 weeks / 90 days  PLANNED INTERVENTIONS: Therapeutic exercises, Therapeutic activity, Neuromuscular re-education, Balance training, Gait training, Patient/Family education, Self Care, Vestibular training, Prosthetic training, DME instructions, and Manual therapy  PLAN FOR NEXT SESSION: per pt & husband request work on scooting/squat pivot transfers without RW,  Work on patient walking between 2 chairs and decreasing need for wheelchair to be  followed behind patient.  Continue gait and transfers including stand pivot with rolling walker, leg strength, balance    April Manson, PT, DPT 04/13/2023, 4:53 PM

## 2023-04-18 ENCOUNTER — Encounter: Payer: Self-pay | Admitting: Physical Therapy

## 2023-04-18 ENCOUNTER — Ambulatory Visit (INDEPENDENT_AMBULATORY_CARE_PROVIDER_SITE_OTHER): Payer: Medicare PPO | Admitting: Physical Therapy

## 2023-04-18 DIAGNOSIS — R2681 Unsteadiness on feet: Secondary | ICD-10-CM

## 2023-04-18 DIAGNOSIS — R2689 Other abnormalities of gait and mobility: Secondary | ICD-10-CM

## 2023-04-18 NOTE — Therapy (Signed)
OUTPATIENT PHYSICAL THERAPY TREATMENT  Patient Name: Carolyn Russo MRN: [id number] DOB:1950/12/01, 72 y.o., female Today's Date: 04/18/2023  PCP: No PCP on file REFERRING PROVIDER: Aldean Baker, MD     END OF SESSION:  PT End of Session - 04/18/23 1537     Visit Number 18    Number of Visits 36    Date for PT Re-Evaluation 06/01/23    Authorization Type [employer] PPO    Authorization Time Period $20 co-pay, Authorization #[id number]  Tracking #[id number] 12 PT VISITS 5/13-06/01/23    Authorization - Visit Number 6    Authorization - Number of Visits 12    Progress Note Due on Visit 20    PT Start Time 1518    PT Stop Time 1525    PT Time Calculation (min) 7 min    Equipment Utilized During Treatment Gait belt    Activity Tolerance Patient limited by fatigue    Behavior During Therapy WFL for tasks assessed/performed                 Past Medical History:  Diagnosis Date   Acute GI bleeding    Allergy    Anemia    Anterior chest wall pain    Appendicitis 1965   Asthma    Body mass index 37.0-37.9, adult    Breast pain    Cataract    both eyes   CHF (congestive heart failure) (HCC)    Chronic kidney disease    stage 5 - on dialysis   Cognitive change 04/20/2021   r/t cva 03/2821   Complication of anesthesia    Memory loss after general   Dehydration 2014   Deviated septum 1971   Diabetes mellitus    Dysphagia due to old stroke    easy to get strangled when eating   Dyspnea 2014   Extrinsic asthma    WITH ASTHMA ATTACK   Fibroid 1980   GERD (gastroesophageal reflux disease)    Heart murmur    History of migraine    History of seizure    with stroke   Hx gestational diabetes    Hyperlipidemia    Hypertension 2014   Inguinal hernia 1959   Malaise and fatigue 2014   Non-IgE mediated allergic asthma 2014   Obesity    Pelvic pain    Pregnancy, high-risk 1985   Stroke (HCC) 04/20/2021   (CVA) of right basal ganglia   Tonsillitis 1968    Uterine fibroid 1980   Visual field defect    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   Past Surgical History:  Procedure Laterality Date   ABDOMINAL AORTOGRAM W/LOWER EXTREMITY N/A 02/21/2022   Procedure: ABDOMINAL AORTOGRAM W/LOWER EXTREMITY;  Surgeon: Maeola Harman, MD;  Location: [facility] INVASIVE CV LAB;  Service: Cardiovascular;  Laterality: N/A;   AMPUTATION Right 05/18/2022   Procedure: RIGHT BELOW KNEE AMPUTATION;  Surgeon: Nadara Mustard, MD;  Location: [facility] OR;  Service: Orthopedics;  Laterality: Right;   AMPUTATION TOE Right 04/15/2022   Procedure: AMPUTATION  LST TOERIGHT FOOT;  Surgeon: Edwin Cap, DPM;  Location: [facility] ORS;  Service: Podiatry;  Laterality: Right;   APPENDECTOMY  1959   BASCILIC VEIN TRANSPOSITION Right 04/30/2021   Procedure: RIGHT FIRST STAGE BASCILIC VEIN TRANSPOSITION;  Surgeon: Chuck Hint, MD;  Location: [facility] OR;  Service: Vascular;  Laterality: Right;   CESAREAN SECTION  1985   COLONOSCOPY     ESOPHAGOGASTRODUODENOSCOPY (EGD) WITH PROPOFOL N/A 04/18/2021  Procedure: ESOPHAGOGASTRODUODENOSCOPY (EGD) WITH PROPOFOL;  Surgeon: Iva Boop, MD;  Location: [facility] ENDOSCOPY;  Service: Endoscopy;  Laterality: N/A;   EYE SURGERY     bilateral cataract    FLEXIBLE SIGMOIDOSCOPY N/A 04/18/2021   Procedure: FLEXIBLE SIGMOIDOSCOPY;  Surgeon: Iva Boop, MD;  Location: [facility] ENDOSCOPY;  Service: Endoscopy;  Laterality: N/A;   HERNIA REPAIR  1959   IR FLUORO GUIDE CV LINE RIGHT  03/18/2021   IR US GUIDE VASC ACCESS RIGHT  03/18/2021   LEFT HEART CATH AND CORONARY ANGIOGRAPHY N/A 04/04/2017   Procedure: Left Heart Cath and Coronary Angiography;  Surgeon: Lyn Records, MD;  Location: [facility] INVASIVE CV LAB;  Service: Cardiovascular;  Laterality: N/A;   MYOMECTOMY  1980, 2004, 2007   PERIPHERAL VASCULAR THROMBECTOMY  02/21/2022   Procedure: PERIPHERAL VASCULAR THROMBECTOMY;  Surgeon: Maeola Harman, MD;  Location: [facility] INVASIVE CV LAB;  Service: Cardiovascular;;  Right  Tibial   RHINOPLASTY  1971   ROTATOR CUFF REPAIR  2003   SURGICAL REPAIR OF HEMORRHAGE  2015   TONSILLECTOMY  1968   Patient Active Problem List   Diagnosis Date Noted   Hx of right BKA (HCC) 03/10/2023   CHF (congestive heart failure) (HCC) 02/10/2023   Dyspnea 02/09/2023   Acute respiratory distress 02/09/2023   Urinary tract infection without hematuria    Diabetes mellitus due to underlying condition with hypoglycemia without coma, with long-term current use of insulin (HCC)    Seizure disorder (HCC) 06/22/2022   Acute respiratory failure (HCC)    Unresponsiveness    Pressure ulcer 06/07/2022   Acute stroke due to ischemia (HCC) 06/03/2022   Chest pain 06/01/2022   History of CVA (cerebrovascular accident) 06/01/2022   Seizure (HCC)    Encephalopathy    Below-knee amputation of right lower extremity (HCC)    Leukocytosis    Foot infection 05/10/2022   PAD (peripheral artery disease) (HCC) 05/10/2022   Gangrene of right foot (HCC) 04/15/2022   Left hemiparesis (HCC) 09/24/2021   Abnormality of gait 09/24/2021   Pain of right hand 08/06/2021   Steal syndrome of dialysis vascular access (HCC) 08/06/2021   Subclavian steal syndrome of right subclavian artery 06/25/2021   Right hand weakness 06/25/2021   Stroke-like symptoms 05/06/2021   Bandemia    Cerebrovascular accident (CVA) of right basal ganglia (HCC) 04/20/2021   Hemorrhoids    Basal ganglia infarction (HCC) 04/16/2021   ESRD on dialysis (HCC) 04/16/2021   Hypertensive urgency 03/10/2021   Hilar enlargement    Anemia of chronic disease    Chronic diastolic CHF (congestive heart failure) (HCC) 03/09/2021   CKD (chronic kidney disease), stage V (HCC) 03/08/2021   Diabetic retinopathy (HCC) 03/08/2021   Hyperglycemia due to type 2 diabetes mellitus (HCC) 03/08/2021   Pure hypercholesterolemia 03/08/2021   Anemia in chronic kidney disease 09/24/2020   Uncontrolled type 2 diabetes mellitus with hyperglycemia (HCC)  03/19/2019   Osteopenia 09/04/2018   Migraine 09/04/2018   Asthma 09/04/2018   Pseudophakia of both eyes 07/17/2018   Pseudophakia of left eye 07/03/2018   Open angle with borderline findings and high glaucoma risk in left eye 07/03/2018   Age-related nuclear cataract of right eye 07/03/2018   CAD (coronary artery disease), native coronary artery 04/27/2017   Abnormal nuclear stress test 04/04/2017   Shortness of breath 03/09/2017   Appendicitis    Age-related hypermature cataract of both eyes 12/20/2016   Uterine leiomyoma 11/27/2012   Dyslipidemia (high LDL; low HDL) 11/25/2011  Obesity (BMI 30-39.9) 11/25/2011   Hypertensive disorder 11/21/2011   Type 2 diabetes mellitus (HCC) 11/21/2011   Abnormal EKG 11/21/2011    ONSET DATE: 12/29/2022 prosthesis delivery  REFERRING DIAG: Q65.784O (ICD-10-CM) - Below-knee amputation of right lower extremity   THERAPY DIAG:  Unsteadiness on feet  Other abnormalities of gait and mobility  Rationale for Evaluation and Treatment: Rehabilitation  SUBJECTIVE:   SUBJECTIVE STATEMENT: Patient did not go to dialysis last Wednesday due to illness and stopped dialysis early yesterday. So she is retaining significantly more fluid. She was unable to sleep much last night.    PERTINENT HISTORY: CVA / left hemiparesis due to right basal ganglia infarct 7/22, right hip bursitis, anxiety, dCHF, ESRD on HD, HTN, DM, CAD  PAIN:  Are you having pain?  Yes: NPRS scale: 0/10 sitting, complaints of pain with standing but does not rate Pain location: Rt knee Pain description: throbbing Aggravating factors: standing, walking Relieving factors: Voltarin & massage  PRECAUTIONS: Fall and Other: NO BP RUE  WEIGHT BEARING RESTRICTIONS: No  FALLS: Has patient fallen in last 6 months? No  LIVING ENVIRONMENT: Lives with: lives with their spouse and CNAs 24 hrs 7 days / wk Lives in: House Home Access: Ramped entrance Home layout: Multi level, Full bath on  main level, and Able to live on main level with bedroom and bathroom Stairs: Yes: Internal: 16 steps; on left going up and External: 4 steps; on right going up Has following equipment at home: Single point cane, Walker - 2 wheeled, Wheelchair (manual), and shower chair  PLOF: Independent with household mobility with device and Independent with community mobility with device  PATIENT GOALS: walk with prosthesis, stand & transfer.   OBJECTIVE:  COGNITION: Overall cognitive status: History of cognitive impairments - at baseline  POSTURE: rounded shoulders, forward head, decreased lumbar lordosis, increased thoracic kyphosis, and flexed trunk   LOWER EXTREMITY ROM: Eval / 01/05/2023:    BLEs PROM WFL except unable to assess hip ext  (Blank rows = not tested)  LOWER EXTREMITY MMT:  MMT Right eval Right 03/06/23  Hip flexion 3-/5 3-/5  Hip extension 3-/5 3-/5  Hip abduction 3-/5 3-/5  Hip adduction    Hip internal rotation    Hip external rotation    Knee flexion 3-/5 3-/5  Knee extension 3-/5 3-/5  Ankle dorsiflexion NA NA  Ankle plantarflexion NA NA  Ankle inversion NA NA  Ankle eversion NA NA  03/06/2023: LLE hemiparesis but able to advance during gait without assistance.   Eval / 01/05/2023: left LE & UE hemiparesis.  In gait slides to advance LLE and knee / hip instability in stance.  (Blank rows = not tested)  TRANSFERS: 03/06/2023:  Sit to stand w/c & 18" chair with armrests to RW with modA. Stand to sit with minA. Stand-pivot  transfer with RW with modA / constant cueing.  02/07/2023: Sit to stand w/c or 18" chair with armrests to RW with modA. Stand to sit with minA. Both require constant cues. Stand-pivot transfer with RW with modA / constant cueing. (2nd person for safety)   01/05/2023 / Eval: Sit to stand: Max A pulling BUEs on //bars from w/c Stand to sit: Min A using BUEs //bars to control descent Lateral scoot transfers: Mod A w/c to / from mat table level &  adjacent   GAIT: 03/06/2023: Pt amb 5' with RW with +2 modA including 90* turn to chair.  02/07/2023: Pt amb 5' with RW with +2 modA  including 90* turn to chair.   01/05/2023 / Eval: Gait pattern: step to pattern, decreased step length- Right, decreased step length- Left, decreased stance time- Right, decreased stance time- Left, decreased hip/knee flexion- Left, decreased ankle dorsiflexion- Left, Right hip hike, Left hip hike, knee flexed in stance- Left, antalgic, lateral hip instability, trunk flexed, wide BOS, and poor foot clearance- Left Distance walked: 3' Assistive device utilized: // bars & RLE TTA prosthesis Level of assistance: Total A / 2 person Comments:  LLE with poor clearance / slides foot forward and knee / hip instability in stance.   FUNCTIONAL TESTs:  03/06/2023: static stance with RW support 60 sec with minA.  Eval / 01/05/2023:  Standing in //bars for 30 sec with modA.    CURRENT PROSTHETIC WEAR ASSESSMENT: 03/06/2023: Patient's husband & CNA correctly don & doff her prosthesis. Pt is dependent requiring maxA to don or doff.  She reports wearing prosthesis 5 hrs 2x/day for 10 hours total on non-dialysis days and up to 8 hours on dialysis days.  Her limb has scab on lateral incision that appears to be internal suture working way out. No signs of infection. Her residual limb appearance is dry skin, normal temperature, limited to no hair growth, cylindrical shape.   Eval / 01/05/2023:  Patient is dependent with: skin check, residual limb care, care of non-amputated limb, prosthetic cleaning, ply sock cleaning, correct ply sock adjustment, proper wear schedule/adjustment, and proper weight-bearing schedule/adjustment Donning prosthesis: Total A / family & CNA requires 100% cueing Doffing prosthesis: Total A / family & CNA requires 100% cueing Prosthetic wear tolerance: 1-1.5 hours, 1x/day, 6 of 7 days since delivery Prosthetic weight bearing tolerance: 3 minutes during  eval with c/o knee pain Edema: pitting Residual limb condition: lateral incision with 3mm X 6mm long scabs with serous drainage, arrived with bandaid over wound,  dry skin, normal temperature & color, cylindrical shape Prosthetic description: silicon liner with pin lock suspension, total contact socket, SACH foot K code/activity level with prosthetic use: Level 1    TODAY'S TREATMENT:                                                                                                                             DATE:  04/18/2023: PT held session today due to patient's condition with extreme fatigue. See subjective for issues.  04/13/2023: Prosthetic Training with TTA prosthesis Sit to stand with RW and patient pushing off wheelchair with min to mod assist, requires cues for set up, hand placement, and sequence, X 4 reps total Patient ambulated 10 feet X 3 with rolling walker and min A once ambulating and then performed 90 deg turns to chair.  Stand pivot transfer from chair with arm rests to other chair with armrests 4 reps total, 2 reps to Rt, and 2 reps to left. She appears to have easier time moving to her Rt compared to left. Requires overall mod A to her Rt and mod  to max to her Lt.  Stand pivot transfers from Wheelchair to leg press with RW and mod A overall for turning, and RW management and then once sitting on leg press chair, max A for management of placing legs on leg press platform moving from sittting to supine at 45 deg incline. Leg press DL 16# 1W96, then single leg 31# X10 each side with manual assistance at left knee to reduce knee varus/hip rotation  04/11/2023: Prosthetic Training with TTA prosthesis Sit to stand with RW and patient pushing off wheelchair with min to mod assist, requires cues for set up, hand placement, and sequence, X 6 reps total Patient ambulated 10 feet X 3 with rolling walker and min A once ambulating PT educated pt & husband in adjusting ply socks with donning  ease, number of clicks in sitting, perceived height in gait, pressure in gait and depth of patella after walking so limb is seated in socket. With too few, too many and correct ply socks.  Pt & husband verbalized understanding.  Stand pivot transfers from Wheelchair to leg press with RW and mod A overall for turning, and RW management and then once sitting on leg press chair, max A for management of placing legs on leg press platform moving from sittting to supine at 45 deg incline. Leg press DL 04# 5W09, then single leg 31# X10 each side with manual assistance at left knee to reduce knee varus/hip IT   04/06/23 Prosthetic Training with TTA prosthesis Sit to stand with RW and patient pushing off wheelchair with min to mod assist, requires cues for set up, hand placement, and sequence, X 6 reps total Patient ambulated 20 feet X 4 with rolling walker and min A once ambulating Step ups in // bars with bilat UE support onto 4 inch step X 1 rep each leg with min to mod overall assistance needed. Stand pivot transfers from Wheelchair to leg press with RW and mod A overall for turning, and RW management and then once sitting on leg press chair, max A for management of placing legs on leg press platform moving from sittting to supine at 45 deg incline. Leg press DL 81# 1B14, then single leg 31# X10 each side with manual assistance at left knee to reduce knee varus/hip IT Max A to transfer from leg press supine at 45 deg to sitting on edge of leg press. Then mod A to transfer from leg press to wheel chair at 90 deg.    03/30/23 Prosthetic Training with TTA prosthesis Sit to stand with RW and patient pushing off wheelchair with mod assist, requires cues for set up, hand placement, and sequence, X 6 reps total Patient ambulated 10 feet, then 25 feet  with rolling walker and min A once ambulating one extra person for chair follow (CNA). Stand pivot transfers from Wheelchair to leg press with RW and mod A overall  for turning, and RW management and then once sitting on leg press chair, max A for management of placing legs on leg press platform moving from sittting to supine at 45 deg incline. Leg press DL 78# 2N56, then single leg 25# 2X10 each side with manual assistance at left knee to reduce knee varus/hip IR Max A to transfer from leg press supine at 45 deg to sitting on edge of leg press. Then mod A to transfer from leg press to wheel chair at 90 deg.  Patient ambulated another 20 feet, then 25 feet with rolling walker and min  A from husband while PT was CGA just in case and one extra person for chair follow (CNA).d    HOME EXERCISE PROGRAM:  ASSESSMENT:  CLINICAL IMPRESSION: Pt not seen today as too fatigued that it did not appear would be productive session.   OBJECTIVE IMPAIRMENTS: Abnormal gait, decreased activity tolerance, decreased balance, decreased cognition, decreased coordination, decreased endurance, decreased knowledge of condition, decreased knowledge of use of DME, decreased mobility, difficulty walking, decreased ROM, decreased strength, postural dysfunction, prosthetic dependency , obesity, and pain.   ACTIVITY LIMITATIONS: standing, stairs, transfers, locomotion level, and safe prosthesis use.  PARTICIPATION LIMITATIONS: community activity, church, and household mobility  PERSONAL FACTORS: Age, Fitness, Past/current experiences, Time since onset of injury/illness/exacerbation, and 3+ comorbidities: see PMH  are also affecting patient's functional outcome.   REHAB POTENTIAL: Good  CLINICAL DECISION MAKING: Evolving/moderate complexity  EVALUATION COMPLEXITY: Moderate   GOALS: Goals reviewed with patient? Yes  SHORT TERM GOALS: Target date: 04/06/2023  Patient reports how to correctly adjust dialysis weight and and weigh out to include prosthesis. Baseline: SEE OBJECTIVE DATA Goal status: Met 04/04/2023 2.  Patient tolerates prosthesis >90% of awake hrs/day without  increase in skin issues or limb / knee pain <5/10 with standing. Baseline: SEE OBJECTIVE DATA Goal status:  Met 04/04/2023  3.  Patient stand pivot transfer with RW with minA. Baseline: SEE OBJECTIVE DATA Goal status: partially MET 04/04/2023  4. Patient ambulates 10' including turning 90* to position to sit with RW with modA of one person.  Baseline: SEE OBJECTIVE DATA Goal status: Met 04/04/2023    LONG TERM GOALS: Target date: 06/01/2023  Patient & family / CNA demonstrates & verbalized understanding of prosthetic care to enable safe utilization of prosthesis. Baseline: SEE OBJECTIVE DATA Goal status: ongoing 03/06/2023  Patient tolerates prosthesis wear >90% of awake hours without skin or limb pain issues and knee pain </= 4/10.  Baseline: SEE OBJECTIVE DATA Goal status:  ongoing 03/06/2023  Stand balance with walker: static 2 min with supervision to enable ability for standing ADLS. Baseline: SEE OBJECTIVE DATA Goal status: ongoing 03/06/2023  Patient ambulates >50' with RW & prosthesis only with family / CNA assist safely.  Baseline: SEE OBJECTIVE DATA Goal status:  ongoing 03/06/2023  Patient transfers stand pivot including sit to/from stand with RW with minA or less.  Baseline: SEE OBJECTIVE DATA Goal status:  updated 03/06/2023   PLAN:  PT FREQUENCY: 2x/week  PT DURATION: 13 weeks / 90 days  PLANNED INTERVENTIONS: Therapeutic exercises, Therapeutic activity, Neuromuscular re-education, Balance training, Gait training, Patient/Family education, Self Care, Vestibular training, Prosthetic training, DME instructions, and Manual therapy  PLAN FOR NEXT SESSION: pt wants to work on car transfers,  per pt & husband request work on scooting/squat pivot transfers without RW,  Work on patient walking between 2 chairs and decreasing need for wheelchair to be followed behind patient.  Continue gait and transfers including stand pivot with rolling walker, leg strength,  balance   Vladimir Faster, PT, DPT 04/18/2023, 3:42 PM

## 2023-04-21 ENCOUNTER — Encounter (INDEPENDENT_AMBULATORY_CARE_PROVIDER_SITE_OTHER): Payer: Medicare PPO | Admitting: Ophthalmology

## 2023-04-21 DIAGNOSIS — E113591 Type 2 diabetes mellitus with proliferative diabetic retinopathy without macular edema, right eye: Secondary | ICD-10-CM | POA: Diagnosis not present

## 2023-04-21 DIAGNOSIS — H35033 Hypertensive retinopathy, bilateral: Secondary | ICD-10-CM

## 2023-04-21 DIAGNOSIS — I10 Essential (primary) hypertension: Secondary | ICD-10-CM

## 2023-04-21 DIAGNOSIS — Z794 Long term (current) use of insulin: Secondary | ICD-10-CM

## 2023-04-21 DIAGNOSIS — E113512 Type 2 diabetes mellitus with proliferative diabetic retinopathy with macular edema, left eye: Secondary | ICD-10-CM

## 2023-04-21 DIAGNOSIS — H43813 Vitreous degeneration, bilateral: Secondary | ICD-10-CM

## 2023-04-21 DIAGNOSIS — H4312 Vitreous hemorrhage, left eye: Secondary | ICD-10-CM

## 2023-05-02 ENCOUNTER — Encounter: Payer: Self-pay | Admitting: Physical Therapy

## 2023-05-02 ENCOUNTER — Ambulatory Visit: Payer: Medicare PPO | Admitting: Physical Therapy

## 2023-05-02 DIAGNOSIS — M6281 Muscle weakness (generalized): Secondary | ICD-10-CM

## 2023-05-02 DIAGNOSIS — L98498 Non-pressure chronic ulcer of skin of other sites with other specified severity: Secondary | ICD-10-CM

## 2023-05-02 DIAGNOSIS — R2681 Unsteadiness on feet: Secondary | ICD-10-CM

## 2023-05-02 DIAGNOSIS — I69354 Hemiplegia and hemiparesis following cerebral infarction affecting left non-dominant side: Secondary | ICD-10-CM | POA: Diagnosis not present

## 2023-05-02 DIAGNOSIS — R2689 Other abnormalities of gait and mobility: Secondary | ICD-10-CM | POA: Diagnosis not present

## 2023-05-02 DIAGNOSIS — M25561 Pain in right knee: Secondary | ICD-10-CM

## 2023-05-02 DIAGNOSIS — G8929 Other chronic pain: Secondary | ICD-10-CM

## 2023-05-02 DIAGNOSIS — M25562 Pain in left knee: Secondary | ICD-10-CM

## 2023-05-02 NOTE — Therapy (Signed)
OUTPATIENT PHYSICAL THERAPY TREATMENT  Patient Name: DEBANHI BLAKER MRN: [id number] DOB:1951/05/17, 72 y.o., female Today's Date: 05/02/2023  PCP: No PCP on file REFERRING PROVIDER: Aldean Baker, MD     END OF SESSION:  PT End of Session - 05/02/23 1523     Visit Number 19    Number of Visits 36    Date for PT Re-Evaluation 06/01/23    Authorization Type [employer] PPO    Authorization Time Period $20 co-pay, Authorization #[id number]  Tracking #[id number] PT VISITS 5/13-06/01/23    Authorization - Visit Number 7    Authorization - Number of Visits 12    Progress Note Due on Visit 20    PT Start Time 1515    PT Stop Time 1558    PT Time Calculation (min) 43 min    Equipment Utilized During Treatment Gait belt    Activity Tolerance Patient limited by fatigue    Behavior During Therapy WFL for tasks assessed/performed                 Past Medical History:  Diagnosis Date   Acute GI bleeding    Allergy    Anemia    Anterior chest wall pain    Appendicitis 1965   Asthma    Body mass index 37.0-37.9, adult    Breast pain    Cataract    both eyes   CHF (congestive heart failure) (HCC)    Chronic kidney disease    stage 5 - on dialysis   Cognitive change 04/20/2021   r/t cva 03/2821   Complication of anesthesia    Memory loss after general   Dehydration 2014   Deviated septum 1971   Diabetes mellitus    Dysphagia due to old stroke    easy to get strangled when eating   Dyspnea 2014   Extrinsic asthma    WITH ASTHMA ATTACK   Fibroid 1980   GERD (gastroesophageal reflux disease)    Heart murmur    History of migraine    History of seizure    with stroke   Hx gestational diabetes    Hyperlipidemia    Hypertension 2014   Inguinal hernia 1959   Malaise and fatigue 2014   Non-IgE mediated allergic asthma 2014   Obesity    Pelvic pain    Pregnancy, high-risk 1985   Stroke (HCC) 04/20/2021   (CVA) of right basal ganglia   Tonsillitis 1968    Uterine fibroid 1980   Visual field defect    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   Past Surgical History:  Procedure Laterality Date   ABDOMINAL AORTOGRAM W/LOWER EXTREMITY N/A 02/21/2022   Procedure: ABDOMINAL AORTOGRAM W/LOWER EXTREMITY;  Surgeon: Maeola Harman, MD;  Location: [facility] INVASIVE CV LAB;  Service: Cardiovascular;  Laterality: N/A;   AMPUTATION Right 05/18/2022   Procedure: RIGHT BELOW KNEE AMPUTATION;  Surgeon: Nadara Mustard, MD;  Location: [facility] OR;  Service: Orthopedics;  Laterality: Right;   AMPUTATION TOE Right 04/15/2022   Procedure: AMPUTATION  LST TOERIGHT FOOT;  Surgeon: Edwin Cap, DPM;  Location: [facility] ORS;  Service: Podiatry;  Laterality: Right;   APPENDECTOMY  1959   BASCILIC VEIN TRANSPOSITION Right 04/30/2021   Procedure: RIGHT FIRST STAGE BASCILIC VEIN TRANSPOSITION;  Surgeon: Chuck Hint, MD;  Location: [facility] OR;  Service: Vascular;  Laterality: Right;   CESAREAN SECTION  1985   COLONOSCOPY     ESOPHAGOGASTRODUODENOSCOPY (EGD) WITH PROPOFOL N/A 04/18/2021  Procedure: ESOPHAGOGASTRODUODENOSCOPY (EGD) WITH PROPOFOL;  Surgeon: Iva Boop, MD;  Location: [facility] ENDOSCOPY;  Service: Endoscopy;  Laterality: N/A;   EYE SURGERY     bilateral cataract    FLEXIBLE SIGMOIDOSCOPY N/A 04/18/2021   Procedure: FLEXIBLE SIGMOIDOSCOPY;  Surgeon: Iva Boop, MD;  Location: [facility] ENDOSCOPY;  Service: Endoscopy;  Laterality: N/A;   HERNIA REPAIR  1959   IR FLUORO GUIDE CV LINE RIGHT  03/18/2021   IR US GUIDE VASC ACCESS RIGHT  03/18/2021   LEFT HEART CATH AND CORONARY ANGIOGRAPHY N/A 04/04/2017   Procedure: Left Heart Cath and Coronary Angiography;  Surgeon: Lyn Records, MD;  Location: [facility] INVASIVE CV LAB;  Service: Cardiovascular;  Laterality: N/A;   MYOMECTOMY  1980, 2004, 2007   PERIPHERAL VASCULAR THROMBECTOMY  02/21/2022   Procedure: PERIPHERAL VASCULAR THROMBECTOMY;  Surgeon: Maeola Harman, MD;  Location: [facility] INVASIVE CV LAB;  Service: Cardiovascular;;  Right  Tibial   RHINOPLASTY  1971   ROTATOR CUFF REPAIR  2003   SURGICAL REPAIR OF HEMORRHAGE  2015   TONSILLECTOMY  1968   Patient Active Problem List   Diagnosis Date Noted   Hx of right BKA (HCC) 03/10/2023   CHF (congestive heart failure) (HCC) 02/10/2023   Dyspnea 02/09/2023   Acute respiratory distress 02/09/2023   Urinary tract infection without hematuria    Diabetes mellitus due to underlying condition with hypoglycemia without coma, with long-term current use of insulin (HCC)    Seizure disorder (HCC) 06/22/2022   Acute respiratory failure (HCC)    Unresponsiveness    Pressure ulcer 06/07/2022   Acute stroke due to ischemia (HCC) 06/03/2022   Chest pain 06/01/2022   History of CVA (cerebrovascular accident) 06/01/2022   Seizure (HCC)    Encephalopathy    Below-knee amputation of right lower extremity (HCC)    Leukocytosis    Foot infection 05/10/2022   PAD (peripheral artery disease) (HCC) 05/10/2022   Gangrene of right foot (HCC) 04/15/2022   Left hemiparesis (HCC) 09/24/2021   Abnormality of gait 09/24/2021   Pain of right hand 08/06/2021   Steal syndrome of dialysis vascular access (HCC) 08/06/2021   Subclavian steal syndrome of right subclavian artery 06/25/2021   Right hand weakness 06/25/2021   Stroke-like symptoms 05/06/2021   Bandemia    Cerebrovascular accident (CVA) of right basal ganglia (HCC) 04/20/2021   Hemorrhoids    Basal ganglia infarction (HCC) 04/16/2021   ESRD on dialysis (HCC) 04/16/2021   Hypertensive urgency 03/10/2021   Hilar enlargement    Anemia of chronic disease    Chronic diastolic CHF (congestive heart failure) (HCC) 03/09/2021   CKD (chronic kidney disease), stage V (HCC) 03/08/2021   Diabetic retinopathy (HCC) 03/08/2021   Hyperglycemia due to type 2 diabetes mellitus (HCC) 03/08/2021   Pure hypercholesterolemia 03/08/2021   Anemia in chronic kidney disease 09/24/2020   Uncontrolled type 2 diabetes mellitus with hyperglycemia (HCC)  03/19/2019   Osteopenia 09/04/2018   Migraine 09/04/2018   Asthma 09/04/2018   Pseudophakia of both eyes 07/17/2018   Pseudophakia of left eye 07/03/2018   Open angle with borderline findings and high glaucoma risk in left eye 07/03/2018   Age-related nuclear cataract of right eye 07/03/2018   CAD (coronary artery disease), native coronary artery 04/27/2017   Abnormal nuclear stress test 04/04/2017   Shortness of breath 03/09/2017   Appendicitis    Age-related hypermature cataract of both eyes 12/20/2016   Uterine leiomyoma 11/27/2012   Dyslipidemia (high LDL; low HDL) 11/25/2011  Obesity (BMI 30-39.9) 11/25/2011   Hypertensive disorder 11/21/2011   Type 2 diabetes mellitus (HCC) 11/21/2011   Abnormal EKG 11/21/2011    ONSET DATE: 12/29/2022 prosthesis delivery  REFERRING DIAG: Z61.096E (ICD-10-CM) - Below-knee amputation of right lower extremity   THERAPY DIAG:  Unsteadiness on feet  Other abnormalities of gait and mobility  Muscle weakness (generalized)  Hemiplegia and hemiparesis following cerebral infarction affecting left non-dominant side (HCC)  Chronic pain of both knees  Non-pressure chronic ulcer of skin of other sites with other specified severity (HCC)  Rationale for Evaluation and Treatment: Rehabilitation  SUBJECTIVE:   SUBJECTIVE STATEMENT: She has worn prosthesis daily most of awake hours.   PERTINENT HISTORY: CVA / left hemiparesis due to right basal ganglia infarct 7/22, right hip bursitis, anxiety, dCHF, ESRD on HD, HTN, DM, CAD  PAIN:  Are you having pain?  Yes: NPRS scale:   5/10 sitting, complaints of pain with standing but does not rate Pain location: Rt knee Pain description: throbbing Aggravating factors: standing, walking Relieving factors: Voltarin & massage  PRECAUTIONS: Fall and Other: NO BP RUE  WEIGHT BEARING RESTRICTIONS: No  FALLS: Has patient fallen in last 6 months? No  LIVING ENVIRONMENT: Lives with: lives with their  spouse and CNAs 24 hrs 7 days / wk Lives in: House Home Access: Ramped entrance Home layout: Multi level, Full bath on main level, and Able to live on main level with bedroom and bathroom Stairs: Yes: Internal: 16 steps; on left going up and External: 4 steps; on right going up Has following equipment at home: Single point cane, Walker - 2 wheeled, Wheelchair (manual), and shower chair  PLOF: Independent with household mobility with device and Independent with community mobility with device  PATIENT GOALS: walk with prosthesis, stand & transfer.   OBJECTIVE:  COGNITION: Overall cognitive status: History of cognitive impairments - at baseline  POSTURE: rounded shoulders, forward head, decreased lumbar lordosis, increased thoracic kyphosis, and flexed trunk   LOWER EXTREMITY ROM: Eval / 01/05/2023:    BLEs PROM WFL except unable to assess hip ext  (Blank rows = not tested)  LOWER EXTREMITY MMT:  MMT Right eval Right 03/06/23  Hip flexion 3-/5 3-/5  Hip extension 3-/5 3-/5  Hip abduction 3-/5 3-/5  Hip adduction    Hip internal rotation    Hip external rotation    Knee flexion 3-/5 3-/5  Knee extension 3-/5 3-/5  Ankle dorsiflexion NA NA  Ankle plantarflexion NA NA  Ankle inversion NA NA  Ankle eversion NA NA  03/06/2023: LLE hemiparesis but able to advance during gait without assistance.   Eval / 01/05/2023: left LE & UE hemiparesis.  In gait slides to advance LLE and knee / hip instability in stance.  (Blank rows = not tested)  TRANSFERS: 03/06/2023:  Sit to stand w/c & 18" chair with armrests to RW with modA. Stand to sit with minA. Stand-pivot  transfer with RW with modA / constant cueing.  02/07/2023: Sit to stand w/c or 18" chair with armrests to RW with modA. Stand to sit with minA. Both require constant cues. Stand-pivot transfer with RW with modA / constant cueing. (2nd person for safety)   01/05/2023 / Eval: Sit to stand: Max A pulling BUEs on //bars from  w/c Stand to sit: Min A using BUEs //bars to control descent Lateral scoot transfers: Mod A w/c to / from mat table level & adjacent   GAIT: 03/06/2023: Pt amb 5' with RW with +2 modA  including 90* turn to chair.  02/07/2023: Pt amb 5' with RW with +2 modA including 90* turn to chair.   01/05/2023 / Eval: Gait pattern: step to pattern, decreased step length- Right, decreased step length- Left, decreased stance time- Right, decreased stance time- Left, decreased hip/knee flexion- Left, decreased ankle dorsiflexion- Left, Right hip hike, Left hip hike, knee flexed in stance- Left, antalgic, lateral hip instability, trunk flexed, wide BOS, and poor foot clearance- Left Distance walked: 3' Assistive device utilized: // bars & RLE TTA prosthesis Level of assistance: Total A / 2 person Comments:  LLE with poor clearance / slides foot forward and knee / hip instability in stance.   FUNCTIONAL TESTs:  03/06/2023: static stance with RW support 60 sec with minA.  Eval / 01/05/2023:  Standing in //bars for 30 sec with modA.    CURRENT PROSTHETIC WEAR ASSESSMENT: 03/06/2023: Patient's husband & CNA correctly don & doff her prosthesis. Pt is dependent requiring maxA to don or doff.  She reports wearing prosthesis 5 hrs 2x/day for 10 hours total on non-dialysis days and up to 8 hours on dialysis days.  Her limb has scab on lateral incision that appears to be internal suture working way out. No signs of infection. Her residual limb appearance is dry skin, normal temperature, limited to no hair growth, cylindrical shape.   Eval / 01/05/2023:  Patient is dependent with: skin check, residual limb care, care of non-amputated limb, prosthetic cleaning, ply sock cleaning, correct ply sock adjustment, proper wear schedule/adjustment, and proper weight-bearing schedule/adjustment Donning prosthesis: Total A / family & CNA requires 100% cueing Doffing prosthesis: Total A / family & CNA requires 100%  cueing Prosthetic wear tolerance: 1-1.5 hours, 1x/day, 6 of 7 days since delivery Prosthetic weight bearing tolerance: 3 minutes during eval with c/o knee pain Edema: pitting Residual limb condition: lateral incision with 3mm X 6mm long scabs with serous drainage, arrived with bandaid over wound,  dry skin, normal temperature & color, cylindrical shape Prosthetic description: silicon liner with pin lock suspension, total contact socket, SACH foot K code/activity level with prosthetic use: Level 1    TODAY'S TREATMENT:                                                                                                                             DATE:  05/02/2023: Prosthetic Training with TTA prosthesis Sit to stand with RW and patient pushing off wheelchair with min to mod assist, requires cues for set up, hand placement, and sequence, X 4 reps total Patient ambulated 15 feet X 2 with rolling walker and min A once ambulating and performed 90 deg turns to chair.   Therapeutic Exercise Leg press DL 16# 1W96, then single leg 31# X10 reps 2 sets each side with manual assistance at left knee to reduce knee varus/hip rotation PROM hamstring stretch BLEs 30 sec hold 2 reps ea LE during leg press rests  Neuromuscular Re-education: Sitting upright at  edge of w/c with feet on floor working on sitting balance tossing 18" ball 4' for 1 min, then 7' for 1 min.  PT educated pt & husband in performing as HEP to build trunk posture & endurance. Pt's husband verbalized understanding.     04/18/2023: PT held session today due to patient's condition with extreme fatigue. See subjective for issues.  04/13/2023: Prosthetic Training with TTA prosthesis Sit to stand with RW and patient pushing off wheelchair with min to mod assist, requires cues for set up, hand placement, and sequence, X 4 reps total Patient ambulated 10 feet X 3 with rolling walker and min A once ambulating and then performed 90 deg turns to chair.   Stand pivot transfer from chair with arm rests to other chair with armrests 4 reps total, 2 reps to Rt, and 2 reps to left. She appears to have easier time moving to her Rt compared to left. Requires overall mod A to her Rt and mod to max to her Lt.  Stand pivot transfers from Wheelchair to leg press with RW and mod A overall for turning, and RW management and then once sitting on leg press chair, max A for management of placing legs on leg press platform moving from sittting to supine at 45 deg incline. Leg press DL 16# 1W96, then single leg 31# X10 each side with manual assistance at left knee to reduce knee varus/hip rotation  04/11/2023: Prosthetic Training with TTA prosthesis Sit to stand with RW and patient pushing off wheelchair with min to mod assist, requires cues for set up, hand placement, and sequence, X 6 reps total Patient ambulated 10 feet X 3 with rolling walker and min A once ambulating PT educated pt & husband in adjusting ply socks with donning ease, number of clicks in sitting, perceived height in gait, pressure in gait and depth of patella after walking so limb is seated in socket. With too few, too many and correct ply socks.  Pt & husband verbalized understanding.  Stand pivot transfers from Wheelchair to leg press with RW and mod A overall for turning, and RW management and then once sitting on leg press chair, max A for management of placing legs on leg press platform moving from sittting to supine at 45 deg incline. Leg press DL 04# 5W09, then single leg 31# X10 reps each side with manual assistance at left knee to reduce knee varus/hip IT   HOME EXERCISE PROGRAM:  ASSESSMENT:  CLINICAL IMPRESSION: Patient appeared weaker today with longer period of decreased activity between PT visits and her primary CNA are out of country for month.  Pt's husband appears to understand rationale for sitting upright activity.    OBJECTIVE IMPAIRMENTS: Abnormal gait, decreased activity  tolerance, decreased balance, decreased cognition, decreased coordination, decreased endurance, decreased knowledge of condition, decreased knowledge of use of DME, decreased mobility, difficulty walking, decreased ROM, decreased strength, postural dysfunction, prosthetic dependency , obesity, and pain.   ACTIVITY LIMITATIONS: standing, stairs, transfers, locomotion level, and safe prosthesis use.  PARTICIPATION LIMITATIONS: community activity, church, and household mobility  PERSONAL FACTORS: Age, Fitness, Past/current experiences, Time since onset of injury/illness/exacerbation, and 3+ comorbidities: see PMH  are also affecting patient's functional outcome.   REHAB POTENTIAL: Good  CLINICAL DECISION MAKING: Evolving/moderate complexity  EVALUATION COMPLEXITY: Moderate   GOALS: Goals reviewed with patient? Yes  SHORT TERM GOALS: Target date: 04/06/2023  Patient reports how to correctly adjust dialysis weight and and weigh out to include prosthesis. Baseline: SEE  OBJECTIVE DATA Goal status: Met 04/04/2023 2.  Patient tolerates prosthesis >90% of awake hrs/day without increase in skin issues or limb / knee pain <5/10 with standing. Baseline: SEE OBJECTIVE DATA Goal status:  Met 04/04/2023  3.  Patient stand pivot transfer with RW with minA. Baseline: SEE OBJECTIVE DATA Goal status: partially MET 04/04/2023  4. Patient ambulates 10' including turning 90* to position to sit with RW with modA of one person.  Baseline: SEE OBJECTIVE DATA Goal status: Met 04/04/2023    LONG TERM GOALS: Target date: 06/01/2023  Patient & family / CNA demonstrates & verbalized understanding of prosthetic care to enable safe utilization of prosthesis. Baseline: SEE OBJECTIVE DATA Goal status: ongoing 05/02/2023  Patient tolerates prosthesis wear >90% of awake hours without skin or limb pain issues and knee pain </= 4/10.  Baseline: SEE OBJECTIVE DATA Goal status:  ongoing 05/02/2023  Stand balance with  walker: static 2 min with supervision to enable ability for standing ADLS. Baseline: SEE OBJECTIVE DATA Goal status: ongoing 05/02/2023  Patient ambulates >50' with RW & prosthesis only with family / CNA assist safely.  Baseline: SEE OBJECTIVE DATA Goal status: ongoing 05/02/2023  Patient transfers stand pivot including sit to/from stand with RW with minA or less.  Baseline: SEE OBJECTIVE DATA Goal status:  ongoing 05/02/2023   PLAN:  PT FREQUENCY: 2x/week  PT DURATION: 13 weeks / 90 days  PLANNED INTERVENTIONS: Therapeutic exercises, Therapeutic activity, Neuromuscular re-education, Balance training, Gait training, Patient/Family education, Self Care, Vestibular training, Prosthetic training, DME instructions, and Manual therapy  PLAN FOR NEXT SESSION: do 10th visit progress note to MD,   pt wants to work on car transfers, Work on patient walking between 2 chairs and decreasing need for wheelchair to be followed behind patient.  Continue gait and transfers including stand pivot with rolling walker, leg strength, balance   Vladimir Faster, PT, DPT 05/02/2023, 4:12 PM

## 2023-05-04 ENCOUNTER — Other Ambulatory Visit: Payer: Self-pay | Admitting: *Deleted

## 2023-05-04 DIAGNOSIS — I70222 Atherosclerosis of native arteries of extremities with rest pain, left leg: Secondary | ICD-10-CM

## 2023-05-04 DIAGNOSIS — I739 Peripheral vascular disease, unspecified: Secondary | ICD-10-CM

## 2023-05-09 ENCOUNTER — Encounter: Payer: Self-pay | Admitting: Physical Therapy

## 2023-05-09 ENCOUNTER — Ambulatory Visit: Payer: Medicare PPO | Admitting: Physical Therapy

## 2023-05-09 DIAGNOSIS — I69354 Hemiplegia and hemiparesis following cerebral infarction affecting left non-dominant side: Secondary | ICD-10-CM | POA: Diagnosis not present

## 2023-05-09 DIAGNOSIS — R2681 Unsteadiness on feet: Secondary | ICD-10-CM

## 2023-05-09 DIAGNOSIS — R2689 Other abnormalities of gait and mobility: Secondary | ICD-10-CM

## 2023-05-09 DIAGNOSIS — M25561 Pain in right knee: Secondary | ICD-10-CM

## 2023-05-09 DIAGNOSIS — G8929 Other chronic pain: Secondary | ICD-10-CM

## 2023-05-09 DIAGNOSIS — M6281 Muscle weakness (generalized): Secondary | ICD-10-CM | POA: Diagnosis not present

## 2023-05-09 DIAGNOSIS — M25562 Pain in left knee: Secondary | ICD-10-CM

## 2023-05-09 NOTE — Therapy (Signed)
OUTPATIENT PHYSICAL THERAPY TREATMENT Progress Note reporting period date 03/06/23 to 05/09/23  See below for objective and subjective measurements relating to patients progress with PT.    Patient Name: BITANIA SHANKLAND MRN: [id number] DOB:1950/11/24, 72 y.o., female Today's Date: 05/09/2023  PCP: No PCP on file REFERRING PROVIDER: Aldean Baker, MD     END OF SESSION:  PT End of Session - 05/09/23 1551     Visit Number 20    Number of Visits 36    Date for PT Re-Evaluation 06/01/23    Authorization Type [employer] PPO    Authorization Time Period $20 co-pay, Authorization #[id number]  Tracking #[id number] 12 PT VISITS 5/13-06/01/23    Authorization - Number of Visits 12    Progress Note Due on Visit 30    PT Start Time 1550    PT Stop Time 1634    PT Time Calculation (min) 44 min    Equipment Utilized During Treatment Gait belt    Activity Tolerance Patient limited by fatigue    Behavior During Therapy WFL for tasks assessed/performed                 Past Medical History:  Diagnosis Date   Acute GI bleeding    Allergy    Anemia    Anterior chest wall pain    Appendicitis 1965   Asthma    Body mass index 37.0-37.9, adult    Breast pain    Cataract    both eyes   CHF (congestive heart failure) (HCC)    Chronic kidney disease    stage 5 - on dialysis   Cognitive change 04/20/2021   r/t cva 03/2821   Complication of anesthesia    Memory loss after general   Dehydration 2014   Deviated septum 1971   Diabetes mellitus    Dysphagia due to old stroke    easy to get strangled when eating   Dyspnea 2014   Extrinsic asthma    WITH ASTHMA ATTACK   Fibroid 1980   GERD (gastroesophageal reflux disease)    Heart murmur    History of migraine    History of seizure    with stroke   Hx gestational diabetes    Hyperlipidemia    Hypertension 2014   Inguinal hernia 1959   Malaise and fatigue 2014   Non-IgE mediated allergic asthma 2014   Obesity     Pelvic pain    Pregnancy, high-risk 1985   Stroke (HCC) 04/20/2021   (CVA) of right basal ganglia   Tonsillitis 1968   Uterine fibroid 1980   Visual field defect    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   Past Surgical History:  Procedure Laterality Date   ABDOMINAL AORTOGRAM W/LOWER EXTREMITY N/A 02/21/2022   Procedure: ABDOMINAL AORTOGRAM W/LOWER EXTREMITY;  Surgeon: Maeola Harman, MD;  Location: [facility] INVASIVE CV LAB;  Service: Cardiovascular;  Laterality: N/A;   AMPUTATION Right 05/18/2022   Procedure: RIGHT BELOW KNEE AMPUTATION;  Surgeon: Nadara Mustard, MD;  Location: [facility] OR;  Service: Orthopedics;  Laterality: Right;   AMPUTATION TOE Right 04/15/2022   Procedure: AMPUTATION  LST TOERIGHT FOOT;  Surgeon: Edwin Cap, DPM;  Location: [facility] ORS;  Service: Podiatry;  Laterality: Right;   APPENDECTOMY  1959   BASCILIC VEIN TRANSPOSITION Right 04/30/2021   Procedure: RIGHT FIRST STAGE BASCILIC VEIN TRANSPOSITION;  Surgeon: Chuck Hint, MD;  Location: [facility] OR;  Service: Vascular;  Laterality: Right;   CESAREAN SECTION  1985   COLONOSCOPY     ESOPHAGOGASTRODUODENOSCOPY (EGD) WITH PROPOFOL N/A 04/18/2021   Procedure: ESOPHAGOGASTRODUODENOSCOPY (EGD) WITH PROPOFOL;  Surgeon: Iva Boop, MD;  Location: [facility] ENDOSCOPY;  Service: Endoscopy;  Laterality: N/A;   EYE SURGERY     bilateral cataract    FLEXIBLE SIGMOIDOSCOPY N/A 04/18/2021   Procedure: FLEXIBLE SIGMOIDOSCOPY;  Surgeon: Iva Boop, MD;  Location: [facility] ENDOSCOPY;  Service: Endoscopy;  Laterality: N/A;   HERNIA REPAIR  1959   IR FLUORO GUIDE CV LINE RIGHT  03/18/2021   IR US GUIDE VASC ACCESS RIGHT  03/18/2021   LEFT HEART CATH AND CORONARY ANGIOGRAPHY N/A 04/04/2017   Procedure: Left Heart Cath and Coronary Angiography;  Surgeon: Lyn Records, MD;  Location: [facility] INVASIVE CV LAB;  Service: Cardiovascular;  Laterality: N/A;   MYOMECTOMY  1980, 2004, 2007   PERIPHERAL VASCULAR THROMBECTOMY  02/21/2022   Procedure: PERIPHERAL  VASCULAR THROMBECTOMY;  Surgeon: Maeola Harman, MD;  Location: [facility] INVASIVE CV LAB;  Service: Cardiovascular;;  Right Tibial   RHINOPLASTY  1971   ROTATOR CUFF REPAIR  2003   SURGICAL REPAIR OF HEMORRHAGE  2015   TONSILLECTOMY  1968   Patient Active Problem List   Diagnosis Date Noted   Hx of right BKA (HCC) 03/10/2023   CHF (congestive heart failure) (HCC) 02/10/2023   Dyspnea 02/09/2023   Acute respiratory distress 02/09/2023   Urinary tract infection without hematuria    Diabetes mellitus due to underlying condition with hypoglycemia without coma, with long-term current use of insulin (HCC)    Seizure disorder (HCC) 06/22/2022   Acute respiratory failure (HCC)    Unresponsiveness    Pressure ulcer 06/07/2022   Acute stroke due to ischemia (HCC) 06/03/2022   Chest pain 06/01/2022   History of CVA (cerebrovascular accident) 06/01/2022   Seizure (HCC)    Encephalopathy    Below-knee amputation of right lower extremity (HCC)    Leukocytosis    Foot infection 05/10/2022   PAD (peripheral artery disease) (HCC) 05/10/2022   Gangrene of right foot (HCC) 04/15/2022   Left hemiparesis (HCC) 09/24/2021   Abnormality of gait 09/24/2021   Pain of right hand 08/06/2021   Steal syndrome of dialysis vascular access (HCC) 08/06/2021   Subclavian steal syndrome of right subclavian artery 06/25/2021   Right hand weakness 06/25/2021   Stroke-like symptoms 05/06/2021   Bandemia    Cerebrovascular accident (CVA) of right basal ganglia (HCC) 04/20/2021   Hemorrhoids    Basal ganglia infarction (HCC) 04/16/2021   ESRD on dialysis (HCC) 04/16/2021   Hypertensive urgency 03/10/2021   Hilar enlargement    Anemia of chronic disease    Chronic diastolic CHF (congestive heart failure) (HCC) 03/09/2021   CKD (chronic kidney disease), stage V (HCC) 03/08/2021   Diabetic retinopathy (HCC) 03/08/2021   Hyperglycemia due to type 2 diabetes mellitus (HCC) 03/08/2021   Pure  hypercholesterolemia 03/08/2021   Anemia in chronic kidney disease 09/24/2020   Uncontrolled type 2 diabetes mellitus with hyperglycemia (HCC) 03/19/2019   Osteopenia 09/04/2018   Migraine 09/04/2018   Asthma 09/04/2018   Pseudophakia of both eyes 07/17/2018   Pseudophakia of left eye 07/03/2018   Open angle with borderline findings and high glaucoma risk in left eye 07/03/2018   Age-related nuclear cataract of right eye 07/03/2018   CAD (coronary artery disease), native coronary artery 04/27/2017   Abnormal nuclear stress test 04/04/2017   Shortness of breath 03/09/2017   Appendicitis    Age-related hypermature cataract of  both eyes 12/20/2016   Uterine leiomyoma 11/27/2012   Dyslipidemia (high LDL; low HDL) 11/25/2011   Obesity (BMI 30-39.9) 11/25/2011   Hypertensive disorder 11/21/2011   Type 2 diabetes mellitus (HCC) 11/21/2011   Abnormal EKG 11/21/2011    ONSET DATE: 12/29/2022 prosthesis delivery  REFERRING DIAG: K74.259D (ICD-10-CM) - Below-knee amputation of right lower extremity   THERAPY DIAG:  Unsteadiness on feet  Other abnormalities of gait and mobility  Muscle weakness (generalized)  Hemiplegia and hemiparesis following cerebral infarction affecting left non-dominant side (HCC)  Chronic pain of both knees  Rationale for Evaluation and Treatment: Rehabilitation  SUBJECTIVE:   SUBJECTIVE STATEMENT: She has worn prosthesis daily most of awake hours.   PERTINENT HISTORY: CVA / left hemiparesis due to right basal ganglia infarct 7/22, right hip bursitis, anxiety, dCHF, ESRD on HD, HTN, DM, CAD  PAIN:  Are you having pain?  Yes: NPRS scale:   5/10 sitting, complaints of pain with standing but does not rate Pain location: Rt knee Pain description: throbbing Aggravating factors: standing, walking Relieving factors: Voltarin & massage  PRECAUTIONS: Fall and Other: NO BP RUE  WEIGHT BEARING RESTRICTIONS: No  FALLS: Has patient fallen in last 6 months?  No  LIVING ENVIRONMENT: Lives with: lives with their spouse and CNAs 24 hrs 7 days / wk Lives in: House Home Access: Ramped entrance Home layout: Multi level, Full bath on main level, and Able to live on main level with bedroom and bathroom Stairs: Yes: Internal: 16 steps; on left going up and External: 4 steps; on right going up Has following equipment at home: Single point cane, Walker - 2 wheeled, Wheelchair (manual), and shower chair  PLOF: Independent with household mobility with device and Independent with community mobility with device  PATIENT GOALS: walk with prosthesis, stand & transfer.   OBJECTIVE:  COGNITION: Overall cognitive status: History of cognitive impairments - at baseline  POSTURE: rounded shoulders, forward head, decreased lumbar lordosis, increased thoracic kyphosis, and flexed trunk   LOWER EXTREMITY ROM: Eval / 01/05/2023:    BLEs PROM WFL except unable to assess hip ext  (Blank rows = not tested)  LOWER EXTREMITY MMT:  MMT Right eval Right 03/06/23  Hip flexion 3-/5 3-/5  Hip extension 3-/5 3-/5  Hip abduction 3-/5 3-/5  Hip adduction    Hip internal rotation    Hip external rotation    Knee flexion 3-/5 3-/5  Knee extension 3-/5 3-/5  Ankle dorsiflexion NA NA  Ankle plantarflexion NA NA  Ankle inversion NA NA  Ankle eversion NA NA  03/06/2023: LLE hemiparesis but able to advance during gait without assistance.   Eval / 01/05/2023: left LE & UE hemiparesis.  In gait slides to advance LLE and knee / hip instability in stance.  (Blank rows = not tested)  TRANSFERS: 05/09/23 Sit to stand w/c & 18" chair with armrests to RW with modA. Stand to sit with minA. Stand-pivot  transfer with RW with modA / constant cueing. Stand-pivot transfer holding onto PT mod A  03/06/2023:  Sit to stand w/c & 18" chair with armrests to RW with modA. Stand to sit with minA. Stand-pivot  transfer with RW with modA / constant cueing.  02/07/2023: Sit to stand w/c or  18" chair with armrests to RW with modA. Stand to sit with minA. Both require constant cues. Stand-pivot transfer with RW with modA / constant cueing. (2nd person for safety)   01/05/2023 / Eval: Sit to stand: Max A pulling BUEs  on //bars from w/c Stand to sit: Min A using BUEs //bars to control descent Lateral scoot transfers: Mod A w/c to / from mat table level & adjacent   GAIT: 03/06/2023: Pt amb 5' with RW with +2 modA including 90* turn to chair.  02/07/2023: Pt amb 5' with RW with +2 modA including 90* turn to chair.   01/05/2023 / Eval: Gait pattern: step to pattern, decreased step length- Right, decreased step length- Left, decreased stance time- Right, decreased stance time- Left, decreased hip/knee flexion- Left, decreased ankle dorsiflexion- Left, Right hip hike, Left hip hike, knee flexed in stance- Left, antalgic, lateral hip instability, trunk flexed, wide BOS, and poor foot clearance- Left Distance walked: 3' Assistive device utilized: // bars & RLE TTA prosthesis Level of assistance: Total A / 2 person Comments:  LLE with poor clearance / slides foot forward and knee / hip instability in stance.   FUNCTIONAL TESTs:  03/06/2023: static stance with RW support 60 sec with minA.  Eval / 01/05/2023:  Standing in //bars for 30 sec with modA.    CURRENT PROSTHETIC WEAR ASSESSMENT: 03/06/2023: Patient's husband & CNA correctly don & doff her prosthesis. Pt is dependent requiring maxA to don or doff.  She reports wearing prosthesis 5 hrs 2x/day for 10 hours total on non-dialysis days and up to 8 hours on dialysis days.  Her limb has scab on lateral incision that appears to be internal suture working way out. No signs of infection. Her residual limb appearance is dry skin, normal temperature, limited to no hair growth, cylindrical shape.   Eval / 01/05/2023:  Patient is dependent with: skin check, residual limb care, care of non-amputated limb, prosthetic cleaning, ply sock  cleaning, correct ply sock adjustment, proper wear schedule/adjustment, and proper weight-bearing schedule/adjustment Donning prosthesis: Total A / family & CNA requires 100% cueing Doffing prosthesis: Total A / family & CNA requires 100% cueing Prosthetic wear tolerance: 1-1.5 hours, 1x/day, 6 of 7 days since delivery Prosthetic weight bearing tolerance: 3 minutes during eval with c/o knee pain Edema: pitting Residual limb condition: lateral incision with 3mm X 6mm long scabs with serous drainage, arrived with bandaid over wound,  dry skin, normal temperature & color, cylindrical shape Prosthetic description: silicon liner with pin lock suspension, total contact socket, SACH foot K code/activity level with prosthetic use: Level 1    TODAY'S TREATMENT:                                                                                                                             DATE:  05/09/2023: Prosthetic Training with TTA prosthesis Sit to stand with RW and patient pushing off wheelchair with min to mod assist, requires cues for set up, hand placement, and sequence, X 4 reps total Patient stood for 2 minutes in RW with close supervision and has now met short term goal for this. Patient ambulated 15 feet X 2 with rolling walker and min A  once ambulating and performed 90 deg 1 rep and 180 deg turn to chair one rep Pt ambulated 30 feet max tolerated distance at end of session with RW and min A once ambulating  Therapeutic Exercise Leg press DL 65# [id number], then single leg 31# X10 reps 2 sets each side with manual assistance at left knee to reduce knee varus/hip rotation PROM hamstring stretch BLEs 30 sec hold 2 reps ea LE during leg press rests      04/18/2023: PT held session today due to patient's condition with extreme fatigue. See subjective for issues.  04/13/2023: Prosthetic Training with TTA prosthesis Sit to stand with RW and patient pushing off wheelchair with min to mod assist,  requires cues for set up, hand placement, and sequence, X 4 reps total Patient ambulated 10 feet X 3 with rolling walker and min A once ambulating and then performed 90 deg turns to chair.  Stand pivot transfer from chair with arm rests to other chair with armrests 4 reps total, 2 reps to Rt, and 2 reps to left. She appears to have easier time moving to her Rt compared to left. Requires overall mod A to her Rt and mod to max to her Lt.  Stand pivot transfers from Wheelchair to leg press with RW and mod A overall for turning, and RW management and then once sitting on leg press chair, max A for management of placing legs on leg press platform moving from sittting to supine at 45 deg incline. Leg press DL 96# 2X52, then single leg 31# X10 each side with manual assistance at left knee to reduce knee varus/hip rotation  04/11/2023: Prosthetic Training with TTA prosthesis Sit to stand with RW and patient pushing off wheelchair with min to mod assist, requires cues for set up, hand placement, and sequence, X 6 reps total Patient ambulated 10 feet X 3 with rolling walker and min A once ambulating PT educated pt & husband in adjusting ply socks with donning ease, number of clicks in sitting, perceived height in gait, pressure in gait and depth of patella after walking so limb is seated in socket. With too few, too many and correct ply socks.  Pt & husband verbalized understanding.  Stand pivot transfers from Wheelchair to leg press with RW and mod A overall for turning, and RW management and then once sitting on leg press chair, max A for management of placing legs on leg press platform moving from sittting to supine at 45 deg incline. Leg press DL 84# 1L24, then single leg 31# X10 reps each side with manual assistance at left knee to reduce knee varus/hip IT   HOME EXERCISE PROGRAM:  ASSESSMENT:  CLINICAL IMPRESSION: 20th visit progress note shows she is making some funcitonal progress and has met 2/5  long term PT goals but has not yet met all of them. PT recommending to continue with current plan to improve function and decrease care giver burden.   OBJECTIVE IMPAIRMENTS: Abnormal gait, decreased activity tolerance, decreased balance, decreased cognition, decreased coordination, decreased endurance, decreased knowledge of condition, decreased knowledge of use of DME, decreased mobility, difficulty walking, decreased ROM, decreased strength, postural dysfunction, prosthetic dependency , obesity, and pain.   ACTIVITY LIMITATIONS: standing, stairs, transfers, locomotion level, and safe prosthesis use.  PARTICIPATION LIMITATIONS: community activity, church, and household mobility  PERSONAL FACTORS: Age, Fitness, Past/current experiences, Time since onset of injury/illness/exacerbation, and 3+ comorbidities: see PMH  are also affecting patient's functional outcome.   REHAB POTENTIAL: Good  CLINICAL DECISION MAKING: Evolving/moderate complexity  EVALUATION COMPLEXITY: Moderate   GOALS: Goals reviewed with patient? Yes  SHORT TERM GOALS: Target date: 04/06/2023  Patient reports how to correctly adjust dialysis weight and and weigh out to include prosthesis. Baseline: SEE OBJECTIVE DATA Goal status: Met 04/04/2023 2.  Patient tolerates prosthesis >90% of awake hrs/day without increase in skin issues or limb / knee pain <5/10 with standing. Baseline: SEE OBJECTIVE DATA Goal status:  Met 04/04/2023  3.  Patient stand pivot transfer with RW with minA. Baseline: SEE OBJECTIVE DATA Goal status: partially MET 04/04/2023  4. Patient ambulates 10' including turning 90* to position to sit with RW with modA of one person.  Baseline: SEE OBJECTIVE DATA Goal status: Met 04/04/2023    LONG TERM GOALS: Target date: 06/01/2023  Patient & family / CNA demonstrates & verbalized understanding of prosthetic care to enable safe utilization of prosthesis. Baseline: SEE OBJECTIVE DATA Goal status: ongoing  05/09/2023  Patient tolerates prosthesis wear >90% of awake hours without skin or limb pain issues and knee pain </= 4/10.  Baseline: SEE OBJECTIVE DATA Goal status:  MET 05/09/23 She reports she wears it all day now except for about 15 minutes she takes it off due to becoming too hot.   Stand balance with walker: static 2 min with supervision to enable ability for standing ADLS. Baseline: SEE OBJECTIVE DATA Goal status: MET 05/09/2023  Patient ambulates >50' with RW & prosthesis only with family / CNA assist safely.  Baseline: SEE OBJECTIVE DATA Goal status: ongoing 05/02/2023  Patient transfers stand pivot including sit to/from stand with RW with minA or less.  Baseline: SEE OBJECTIVE DATA Goal status:  ongoing 05/09/2023, mod A overall.    PLAN:  PT FREQUENCY: 2x/week  PT DURATION: 13 weeks / 90 days  PLANNED INTERVENTIONS: Therapeutic exercises, Therapeutic activity, Neuromuscular re-education, Balance training, Gait training, Patient/Family education, Self Care, Vestibular training, Prosthetic training, DME instructions, and Manual therapy  PLAN FOR NEXT SESSION:  pt wants to work on car transfers, Work on patient walking between 2 chairs and decreasing need for wheelchair to be followed behind patient.  Continue gait and transfers including stand pivot with rolling walker, leg strength, balance   April Manson, PT, DPT 05/09/2023, 3:52 PM

## 2023-05-11 ENCOUNTER — Encounter: Payer: Self-pay | Admitting: Physical Therapy

## 2023-05-11 ENCOUNTER — Ambulatory Visit: Payer: Medicare PPO | Admitting: Physical Therapy

## 2023-05-11 DIAGNOSIS — R2689 Other abnormalities of gait and mobility: Secondary | ICD-10-CM | POA: Diagnosis not present

## 2023-05-11 DIAGNOSIS — R2681 Unsteadiness on feet: Secondary | ICD-10-CM

## 2023-05-11 DIAGNOSIS — M6281 Muscle weakness (generalized): Secondary | ICD-10-CM

## 2023-05-11 DIAGNOSIS — I69354 Hemiplegia and hemiparesis following cerebral infarction affecting left non-dominant side: Secondary | ICD-10-CM | POA: Diagnosis not present

## 2023-05-11 DIAGNOSIS — G8929 Other chronic pain: Secondary | ICD-10-CM

## 2023-05-11 DIAGNOSIS — M25562 Pain in left knee: Secondary | ICD-10-CM

## 2023-05-11 DIAGNOSIS — M25561 Pain in right knee: Secondary | ICD-10-CM

## 2023-05-11 NOTE — Therapy (Signed)
OUTPATIENT PHYSICAL THERAPY TREATMENT    Patient Name: Carolyn Russo MRN: [id number] DOB:14-Sep-1951, 72 y.o., female Today's Date: 05/11/2023  PCP: No PCP on file REFERRING PROVIDER: Aldean Baker, MD     END OF SESSION:  PT End of Session - 05/11/23 1633     Visit Number 21    Number of Visits 36    Date for PT Re-Evaluation 06/01/23    Authorization Type [employer] PPO    Authorization Time Period $20 co-pay, Authorization #[id number]  Tracking #[id number] 12 PT VISITS 5/13-06/01/23, resubmitted 05/11/23    Authorization - Visit Number 12    Authorization - Number of Visits 12    Progress Note Due on Visit 30    PT Start Time 1605    PT Stop Time 1650    PT Time Calculation (min) 45 min    Equipment Utilized During Treatment Gait belt    Activity Tolerance Patient limited by fatigue    Behavior During Therapy WFL for tasks assessed/performed                 Past Medical History:  Diagnosis Date   Acute GI bleeding    Allergy    Anemia    Anterior chest wall pain    Appendicitis 1965   Asthma    Body mass index 37.0-37.9, adult    Breast pain    Cataract    both eyes   CHF (congestive heart failure) (HCC)    Chronic kidney disease    stage 5 - on dialysis   Cognitive change 04/20/2021   r/t cva 03/2821   Complication of anesthesia    Memory loss after general   Dehydration 2014   Deviated septum 1971   Diabetes mellitus    Dysphagia due to old stroke    easy to get strangled when eating   Dyspnea 2014   Extrinsic asthma    WITH ASTHMA ATTACK   Fibroid 1980   GERD (gastroesophageal reflux disease)    Heart murmur    History of migraine    History of seizure    with stroke   Hx gestational diabetes    Hyperlipidemia    Hypertension 2014   Inguinal hernia 1959   Malaise and fatigue 2014   Non-IgE mediated allergic asthma 2014   Obesity    Pelvic pain    Pregnancy, high-risk 1985   Stroke (HCC) 04/20/2021   (CVA) of right basal  ganglia   Tonsillitis 1968   Uterine fibroid 1980   Visual field defect    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   Past Surgical History:  Procedure Laterality Date   ABDOMINAL AORTOGRAM W/LOWER EXTREMITY N/A 02/21/2022   Procedure: ABDOMINAL AORTOGRAM W/LOWER EXTREMITY;  Surgeon: Maeola Harman, MD;  Location: [facility] INVASIVE CV LAB;  Service: Cardiovascular;  Laterality: N/A;   AMPUTATION Right 05/18/2022   Procedure: RIGHT BELOW KNEE AMPUTATION;  Surgeon: Nadara Mustard, MD;  Location: [facility] OR;  Service: Orthopedics;  Laterality: Right;   AMPUTATION TOE Right 04/15/2022   Procedure: AMPUTATION  LST TOERIGHT FOOT;  Surgeon: Edwin Cap, DPM;  Location: [facility] ORS;  Service: Podiatry;  Laterality: Right;   APPENDECTOMY  1959   BASCILIC VEIN TRANSPOSITION Right 04/30/2021   Procedure: RIGHT FIRST STAGE BASCILIC VEIN TRANSPOSITION;  Surgeon: Chuck Hint, MD;  Location: [facility] OR;  Service: Vascular;  Laterality: Right;   CESAREAN SECTION  1985   COLONOSCOPY     ESOPHAGOGASTRODUODENOSCOPY (EGD) WITH PROPOFOL  N/A 04/18/2021   Procedure: ESOPHAGOGASTRODUODENOSCOPY (EGD) WITH PROPOFOL;  Surgeon: Iva Boop, MD;  Location: [facility] ENDOSCOPY;  Service: Endoscopy;  Laterality: N/A;   EYE SURGERY     bilateral cataract    FLEXIBLE SIGMOIDOSCOPY N/A 04/18/2021   Procedure: FLEXIBLE SIGMOIDOSCOPY;  Surgeon: Iva Boop, MD;  Location: [facility] ENDOSCOPY;  Service: Endoscopy;  Laterality: N/A;   HERNIA REPAIR  1959   IR FLUORO GUIDE CV LINE RIGHT  03/18/2021   IR US GUIDE VASC ACCESS RIGHT  03/18/2021   LEFT HEART CATH AND CORONARY ANGIOGRAPHY N/A 04/04/2017   Procedure: Left Heart Cath and Coronary Angiography;  Surgeon: Lyn Records, MD;  Location: [facility] INVASIVE CV LAB;  Service: Cardiovascular;  Laterality: N/A;   MYOMECTOMY  1980, 2004, 2007   PERIPHERAL VASCULAR THROMBECTOMY  02/21/2022   Procedure: PERIPHERAL VASCULAR THROMBECTOMY;  Surgeon: Maeola Harman, MD;  Location: [facility] INVASIVE CV LAB;   Service: Cardiovascular;;  Right Tibial   RHINOPLASTY  1971   ROTATOR CUFF REPAIR  2003   SURGICAL REPAIR OF HEMORRHAGE  2015   TONSILLECTOMY  1968   Patient Active Problem List   Diagnosis Date Noted   Hx of right BKA (HCC) 03/10/2023   CHF (congestive heart failure) (HCC) 02/10/2023   Dyspnea 02/09/2023   Acute respiratory distress 02/09/2023   Urinary tract infection without hematuria    Diabetes mellitus due to underlying condition with hypoglycemia without coma, with long-term current use of insulin (HCC)    Seizure disorder (HCC) 06/22/2022   Acute respiratory failure (HCC)    Unresponsiveness    Pressure ulcer 06/07/2022   Acute stroke due to ischemia (HCC) 06/03/2022   Chest pain 06/01/2022   History of CVA (cerebrovascular accident) 06/01/2022   Seizure (HCC)    Encephalopathy    Below-knee amputation of right lower extremity (HCC)    Leukocytosis    Foot infection 05/10/2022   PAD (peripheral artery disease) (HCC) 05/10/2022   Gangrene of right foot (HCC) 04/15/2022   Left hemiparesis (HCC) 09/24/2021   Abnormality of gait 09/24/2021   Pain of right hand 08/06/2021   Steal syndrome of dialysis vascular access (HCC) 08/06/2021   Subclavian steal syndrome of right subclavian artery 06/25/2021   Right hand weakness 06/25/2021   Stroke-like symptoms 05/06/2021   Bandemia    Cerebrovascular accident (CVA) of right basal ganglia (HCC) 04/20/2021   Hemorrhoids    Basal ganglia infarction (HCC) 04/16/2021   ESRD on dialysis (HCC) 04/16/2021   Hypertensive urgency 03/10/2021   Hilar enlargement    Anemia of chronic disease    Chronic diastolic CHF (congestive heart failure) (HCC) 03/09/2021   CKD (chronic kidney disease), stage V (HCC) 03/08/2021   Diabetic retinopathy (HCC) 03/08/2021   Hyperglycemia due to type 2 diabetes mellitus (HCC) 03/08/2021   Pure hypercholesterolemia 03/08/2021   Anemia in chronic kidney disease 09/24/2020   Uncontrolled type 2 diabetes  mellitus with hyperglycemia (HCC) 03/19/2019   Osteopenia 09/04/2018   Migraine 09/04/2018   Asthma 09/04/2018   Pseudophakia of both eyes 07/17/2018   Pseudophakia of left eye 07/03/2018   Open angle with borderline findings and high glaucoma risk in left eye 07/03/2018   Age-related nuclear cataract of right eye 07/03/2018   CAD (coronary artery disease), native coronary artery 04/27/2017   Abnormal nuclear stress test 04/04/2017   Shortness of breath 03/09/2017   Appendicitis    Age-related hypermature cataract of both eyes 12/20/2016   Uterine leiomyoma 11/27/2012   Dyslipidemia (high  LDL; low HDL) 11/25/2011   Obesity (BMI 30-39.9) 11/25/2011   Hypertensive disorder 11/21/2011   Type 2 diabetes mellitus (HCC) 11/21/2011   Abnormal EKG 11/21/2011    ONSET DATE: 12/29/2022 prosthesis delivery  REFERRING DIAG: Z61.096E (ICD-10-CM) - Below-knee amputation of right lower extremity   THERAPY DIAG:  Unsteadiness on feet  Other abnormalities of gait and mobility  Muscle weakness (generalized)  Hemiplegia and hemiparesis following cerebral infarction affecting left non-dominant side (HCC)  Chronic pain of both knees  Rationale for Evaluation and Treatment: Rehabilitation  SUBJECTIVE:   SUBJECTIVE STATEMENT: She has worn prosthesis daily most of awake hours.   PERTINENT HISTORY: CVA / left hemiparesis due to right basal ganglia infarct 7/22, right hip bursitis, anxiety, dCHF, ESRD on HD, HTN, DM, CAD  PAIN:  Are you having pain?  Yes: NPRS scale:   5/10 sitting, complaints of pain with standing but does not rate Pain location: Rt knee Pain description: throbbing Aggravating factors: standing, walking Relieving factors: Voltarin & massage  PRECAUTIONS: Fall and Other: NO BP RUE  WEIGHT BEARING RESTRICTIONS: No  FALLS: Has patient fallen in last 6 months? No  LIVING ENVIRONMENT: Lives with: lives with their spouse and CNAs 24 hrs 7 days / wk Lives in: House Home  Access: Ramped entrance Home layout: Multi level, Full bath on main level, and Able to live on main level with bedroom and bathroom Stairs: Yes: Internal: 16 steps; on left going up and External: 4 steps; on right going up Has following equipment at home: Single point cane, Walker - 2 wheeled, Wheelchair (manual), and shower chair  PLOF: Independent with household mobility with device and Independent with community mobility with device  PATIENT GOALS: walk with prosthesis, stand & transfer.   OBJECTIVE:  COGNITION: Overall cognitive status: History of cognitive impairments - at baseline  POSTURE: rounded shoulders, forward head, decreased lumbar lordosis, increased thoracic kyphosis, and flexed trunk   LOWER EXTREMITY ROM: Eval / 01/05/2023:    BLEs PROM WFL except unable to assess hip ext  (Blank rows = not tested)  LOWER EXTREMITY MMT:  MMT Right eval Right 03/06/23  Hip flexion 3-/5 3-/5  Hip extension 3-/5 3-/5  Hip abduction 3-/5 3-/5  Hip adduction    Hip internal rotation    Hip external rotation    Knee flexion 3-/5 3-/5  Knee extension 3-/5 3-/5  Ankle dorsiflexion NA NA  Ankle plantarflexion NA NA  Ankle inversion NA NA  Ankle eversion NA NA  03/06/2023: LLE hemiparesis but able to advance during gait without assistance.   Eval / 01/05/2023: left LE & UE hemiparesis.  In gait slides to advance LLE and knee / hip instability in stance.  (Blank rows = not tested)  TRANSFERS: 05/09/23 Sit to stand w/c & 18" chair with armrests to RW with modA. Stand to sit with minA. Stand-pivot  transfer with RW with modA / constant cueing. Stand-pivot transfer holding onto PT mod A  03/06/2023:  Sit to stand w/c & 18" chair with armrests to RW with modA. Stand to sit with minA. Stand-pivot  transfer with RW with modA / constant cueing.  02/07/2023: Sit to stand w/c or 18" chair with armrests to RW with modA. Stand to sit with minA. Both require constant cues. Stand-pivot  transfer with RW with modA / constant cueing. (2nd person for safety)   01/05/2023 / Eval: Sit to stand: Max A pulling BUEs on //bars from w/c Stand to sit: Min A using BUEs //bars  to control descent Lateral scoot transfers: Mod A w/c to / from mat table level & adjacent   GAIT: 03/06/2023: Pt amb 5' with RW with +2 modA including 90* turn to chair.  02/07/2023: Pt amb 5' with RW with +2 modA including 90* turn to chair.   01/05/2023 / Eval: Gait pattern: step to pattern, decreased step length- Right, decreased step length- Left, decreased stance time- Right, decreased stance time- Left, decreased hip/knee flexion- Left, decreased ankle dorsiflexion- Left, Right hip hike, Left hip hike, knee flexed in stance- Left, antalgic, lateral hip instability, trunk flexed, wide BOS, and poor foot clearance- Left Distance walked: 3' Assistive device utilized: // bars & RLE TTA prosthesis Level of assistance: Total A / 2 person Comments:  LLE with poor clearance / slides foot forward and knee / hip instability in stance.   FUNCTIONAL TESTs:  03/06/2023: static stance with RW support 60 sec with minA.  Eval / 01/05/2023:  Standing in //bars for 30 sec with modA.    CURRENT PROSTHETIC WEAR ASSESSMENT: 03/06/2023: Patient's husband & CNA correctly don & doff her prosthesis. Pt is dependent requiring maxA to don or doff.  She reports wearing prosthesis 5 hrs 2x/day for 10 hours total on non-dialysis days and up to 8 hours on dialysis days.  Her limb has scab on lateral incision that appears to be internal suture working way out. No signs of infection. Her residual limb appearance is dry skin, normal temperature, limited to no hair growth, cylindrical shape.   Eval / 01/05/2023:  Patient is dependent with: skin check, residual limb care, care of non-amputated limb, prosthetic cleaning, ply sock cleaning, correct ply sock adjustment, proper wear schedule/adjustment, and proper weight-bearing  schedule/adjustment Donning prosthesis: Total A / family & CNA requires 100% cueing Doffing prosthesis: Total A / family & CNA requires 100% cueing Prosthetic wear tolerance: 1-1.5 hours, 1x/day, 6 of 7 days since delivery Prosthetic weight bearing tolerance: 3 minutes during eval with c/o knee pain Edema: pitting Residual limb condition: lateral incision with 3mm X 6mm long scabs with serous drainage, arrived with bandaid over wound,  dry skin, normal temperature & color, cylindrical shape Prosthetic description: silicon liner with pin lock suspension, total contact socket, SACH foot K code/activity level with prosthetic use: Level 1    TODAY'S TREATMENT:                                                                                                                             DATE:  05/09/2023: Prosthetic Training with TTA prosthesis Sit to stand with RW and patient pushing off wheelchair with min to mod assist, requires cues for set up, hand placement, and sequence, X 4 reps total Patient stood for 2 minutes in RW with close supervision and has now met short term goal for this. Patient ambulated 15 feet X 2 with rolling walker and min A once ambulating and performed 90 deg 1 rep and 180 deg turn  to chair one rep Pt ambulated 30 feet max tolerated distance at end of session with RW and min A once ambulating  Therapeutic Exercise Leg press DL 93# 2T55, then single leg 31# X10 reps 2 sets each side with manual assistance at left knee to reduce knee varus/hip rotation PROM hamstring stretch BLEs 30 sec hold 2 reps ea LE during leg press rests      04/18/2023: PT held session today due to patient's condition with extreme fatigue. See subjective for issues.  04/13/2023: Prosthetic Training with TTA prosthesis Sit to stand with RW and patient pushing off wheelchair with min to mod assist, requires cues for set up, hand placement, and sequence, X 4 reps total Patient ambulated 10 feet X 3  with rolling walker and min A once ambulating and then performed 90 deg turns to chair.  Stand pivot transfer from chair with arm rests to other chair with armrests 4 reps total, 2 reps to Rt, and 2 reps to left. She appears to have easier time moving to her Rt compared to left. Requires overall mod A to her Rt and mod to max to her Lt.  Stand pivot transfers from Wheelchair to leg press with RW and mod A overall for turning, and RW management and then once sitting on leg press chair, max A for management of placing legs on leg press platform moving from sittting to supine at 45 deg incline. Leg press DL 73# 2K02, then single leg 31# X10 each side with manual assistance at left knee to reduce knee varus/hip rotation  04/11/2023: Prosthetic Training with TTA prosthesis Sit to stand with RW and patient pushing off wheelchair with min to mod assist, requires cues for set up, hand placement, and sequence, X 6 reps total Patient ambulated 10 feet X 3 with rolling walker and min A once ambulating PT educated pt & husband in adjusting ply socks with donning ease, number of clicks in sitting, perceived height in gait, pressure in gait and depth of patella after walking so limb is seated in socket. With too few, too many and correct ply socks.  Pt & husband verbalized understanding.  Stand pivot transfers from Wheelchair to leg press with RW and mod A overall for turning, and RW management and then once sitting on leg press chair, max A for management of placing legs on leg press platform moving from sittting to supine at 45 deg incline. Leg press DL 54# 2H06, then single leg 31# X10 reps each side with manual assistance at left knee to reduce knee varus/hip IT   HOME EXERCISE PROGRAM:  ASSESSMENT:  CLINICAL IMPRESSION: [employer] recert today as she was at visit number 12 of 12 from her last insurance authorization. We continue to work in PT on gait, standing tolerance, functional endurance and transfers. PT  recommending to continue with current plan to improve function and decrease care giver burden.   OBJECTIVE IMPAIRMENTS: Abnormal gait, decreased activity tolerance, decreased balance, decreased cognition, decreased coordination, decreased endurance, decreased knowledge of condition, decreased knowledge of use of DME, decreased mobility, difficulty walking, decreased ROM, decreased strength, postural dysfunction, prosthetic dependency , obesity, and pain.   ACTIVITY LIMITATIONS: standing, stairs, transfers, locomotion level, and safe prosthesis use.  PARTICIPATION LIMITATIONS: community activity, church, and household mobility  PERSONAL FACTORS: Age, Fitness, Past/current experiences, Time since onset of injury/illness/exacerbation, and 3+ comorbidities: see PMH  are also affecting patient's functional outcome.   REHAB POTENTIAL: Good  CLINICAL DECISION MAKING: Evolving/moderate complexity  EVALUATION  COMPLEXITY: Moderate   GOALS: Goals reviewed with patient? Yes  SHORT TERM GOALS: Target date: 04/06/2023  Patient reports how to correctly adjust dialysis weight and and weigh out to include prosthesis. Baseline: SEE OBJECTIVE DATA Goal status: Met 04/04/2023 2.  Patient tolerates prosthesis >90% of awake hrs/day without increase in skin issues or limb / knee pain <5/10 with standing. Baseline: SEE OBJECTIVE DATA Goal status:  Met 04/04/2023  3.  Patient stand pivot transfer with RW with minA. Baseline: SEE OBJECTIVE DATA Goal status: partially MET 04/04/2023  4. Patient ambulates 10' including turning 90* to position to sit with RW with modA of one person.  Baseline: SEE OBJECTIVE DATA Goal status: Met 04/04/2023    LONG TERM GOALS: Target date: 06/01/2023  Patient & family / CNA demonstrates & verbalized understanding of prosthetic care to enable safe utilization of prosthesis. Baseline: SEE OBJECTIVE DATA Goal status: ongoing 05/09/2023  Patient tolerates prosthesis wear >90% of  awake hours without skin or limb pain issues and knee pain </= 4/10.  Baseline: SEE OBJECTIVE DATA Goal status:  MET 05/09/23 She reports she wears it all day now except for about 15 minutes she takes it off due to becoming too hot.   Stand balance with walker: static 2 min with supervision to enable ability for standing ADLS. Baseline: SEE OBJECTIVE DATA Goal status: MET 05/09/2023  Patient ambulates >50' with RW & prosthesis only with family / CNA assist safely.  Baseline: SEE OBJECTIVE DATA Goal status: ongoing 05/02/2023  Patient transfers stand pivot including sit to/from stand with RW with minA or less.  Baseline: SEE OBJECTIVE DATA Goal status:  ongoing 05/09/2023, mod A overall.    PLAN:  PT FREQUENCY: 2x/week  PT DURATION: 13 weeks / 90 days  PLANNED INTERVENTIONS: Therapeutic exercises, Therapeutic activity, Neuromuscular re-education, Balance training, Gait training, Patient/Family education, Self Care, Vestibular training, Prosthetic training, DME instructions, and Manual therapy  PLAN FOR NEXT SESSION:  Look for human re-auth. Work on patient walking between 2 chairs and decreasing need for wheelchair to be followed behind patient.  Continue gait and transfers including stand pivot with rolling walker, leg strength, balance Referring diagnosis? Z61.096E (ICD-10-CM) - Below-knee amputation of right lower extremity  Treatment diagnosis? (if different than referring diagnosis)  Unsteadiness on feet R26.81   Other abnormalities of gait and mobility R26.89   Muscle weakness (generalized) M62.81   Hemiplegia and hemiparesis following cerebral infarction affecting left non-dominant side (HCC) I69.354   Non-pressure chronic ulcer of skin of other sites with other specified severity (HCC) L98.498   Chronic pain of both knees M25.561, M25.562, G89.29   What was this (referring dx) caused by? [x]  Surgery []  Fall []  Ongoing issue []  Arthritis []  Other: ____________    Laterality: [x]  Rt []  Lt []  Both   Check all possible CPT codes:                   *CHOOSE 10 OR LESS*                                [x]  97110 (Therapeutic Exercise)                   []  92507 (SLP Treatment)      [x]  97112 (Neuro Re-ed)                                []   54270 (Swallowing Treatment)                  [x]  [phone] (Gait Training)                                []  [phone] (Cognitive Training, 1st 15 minutes) []  97140 (Manual Therapy)                           []  17616 (Cognitive Training, each add'l 15 minutes)         [x]  97164 (Re-evaluation)                              []  Other, List CPT Code ____________  [x]  97530 (Therapeutic Activities)                                            [x]  97535 (Self Care)               []  All codes above (97110 - 97535)             []  97012 (Mechanical Traction)             []  97014 (E-stim Unattended)             []  97032 (E-stim manual)             []  97033 (Ionto)             []  97035 (Ultrasound) []  97750 (Physical Performance Training) []  [id number] (Aquatic Therapy) []  97016 (Vasopneumatic Device) []  [id number] (Paraffin) []  97034 (Contrast Bath) []  97597 (Wound Care 1st 20 sq cm) []  97598 (Wound Care each add'l 20 sq cm) []  97760 (Orthotic Fabrication, Fitting, Training Initial) [x]  [id number] (Prosthetic Management and Training Initial) []  [id number] (Orthotic or Prosthetic Training/ Modification Subsequent)  April Manson, PT, DPT 05/11/2023, 4:39 PM

## 2023-05-16 ENCOUNTER — Ambulatory Visit: Payer: Medicare PPO | Admitting: Physical Therapy

## 2023-05-16 ENCOUNTER — Encounter: Payer: Self-pay | Admitting: Physical Therapy

## 2023-05-16 DIAGNOSIS — R2681 Unsteadiness on feet: Secondary | ICD-10-CM

## 2023-05-16 DIAGNOSIS — I69354 Hemiplegia and hemiparesis following cerebral infarction affecting left non-dominant side: Secondary | ICD-10-CM | POA: Diagnosis not present

## 2023-05-16 DIAGNOSIS — M6281 Muscle weakness (generalized): Secondary | ICD-10-CM

## 2023-05-16 DIAGNOSIS — R2689 Other abnormalities of gait and mobility: Secondary | ICD-10-CM

## 2023-05-16 DIAGNOSIS — M25562 Pain in left knee: Secondary | ICD-10-CM

## 2023-05-16 DIAGNOSIS — G8929 Other chronic pain: Secondary | ICD-10-CM

## 2023-05-16 DIAGNOSIS — M25561 Pain in right knee: Secondary | ICD-10-CM

## 2023-05-16 NOTE — Therapy (Signed)
OUTPATIENT PHYSICAL THERAPY TREATMENT    Patient Name: Carolyn Russo MRN: [id number] DOB:[birth year], 72 y.o., female Today's Date: 05/16/2023  PCP: No PCP on file REFERRING PROVIDER: Aldean Baker, MD     END OF SESSION:  PT End of Session - 05/16/23 1637     Visit Number 22    Number of Visits 36    Date for PT Re-Evaluation 06/01/23    Authorization Type [employer] PPO    Authorization Time Period $20 co-pay, Authorization #[id number]  Tracking #[id number] PT VISITS 5/13-06/01/23, resubmitted 05/11/23    Authorization - Number of Visits 12    Progress Note Due on Visit 30    PT Start Time 1605    PT Stop Time 1646    PT Time Calculation (min) 41 min    Equipment Utilized During Treatment Gait belt    Activity Tolerance Patient limited by fatigue    Behavior During Therapy WFL for tasks assessed/performed                 Past Medical History:  Diagnosis Date   Acute GI bleeding    Allergy    Anemia    Anterior chest wall pain    Appendicitis 1965   Asthma    Body mass index 37.0-37.9, adult    Breast pain    Cataract    both eyes   CHF (congestive heart failure) (HCC)    Chronic kidney disease    stage 5 - on dialysis   Cognitive change 04/20/2021   r/t cva 03/2821   Complication of anesthesia    Memory loss after general   Dehydration 2014   Deviated septum 1971   Diabetes mellitus    Dysphagia due to old stroke    easy to get strangled when eating   Dyspnea 2014   Extrinsic asthma    WITH ASTHMA ATTACK   Fibroid 1980   GERD (gastroesophageal reflux disease)    Heart murmur    History of migraine    History of seizure    with stroke   Hx gestational diabetes    Hyperlipidemia    Hypertension 2014   Inguinal hernia 1959   Malaise and fatigue 2014   Non-IgE mediated allergic asthma 2014   Obesity    Pelvic pain    Pregnancy, high-risk 1985   Stroke (HCC) 04/20/2021   (CVA) of right basal ganglia   Tonsillitis 1968    Uterine fibroid 1980   Visual field defect    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   Past Surgical History:  Procedure Laterality Date   ABDOMINAL AORTOGRAM W/LOWER EXTREMITY N/A 02/21/2022   Procedure: ABDOMINAL AORTOGRAM W/LOWER EXTREMITY;  Surgeon: Maeola Harman, MD;  Location: [facility] INVASIVE CV LAB;  Service: Cardiovascular;  Laterality: N/A;   AMPUTATION Right 05/18/2022   Procedure: RIGHT BELOW KNEE AMPUTATION;  Surgeon: Nadara Mustard, MD;  Location: [facility] OR;  Service: Orthopedics;  Laterality: Right;   AMPUTATION TOE Right 04/15/2022   Procedure: AMPUTATION  LST TOERIGHT FOOT;  Surgeon: Edwin Cap, DPM;  Location: [facility] ORS;  Service: Podiatry;  Laterality: Right;   APPENDECTOMY  1959   BASCILIC VEIN TRANSPOSITION Right 04/30/2021   Procedure: RIGHT FIRST STAGE BASCILIC VEIN TRANSPOSITION;  Surgeon: Chuck Hint, MD;  Location: [facility] OR;  Service: Vascular;  Laterality: Right;   CESAREAN SECTION  1985   COLONOSCOPY     ESOPHAGOGASTRODUODENOSCOPY (EGD) WITH PROPOFOL N/A 04/18/2021   Procedure: ESOPHAGOGASTRODUODENOSCOPY (EGD) WITH  PROPOFOL;  Surgeon: Iva Boop, MD;  Location: [facility] ENDOSCOPY;  Service: Endoscopy;  Laterality: N/A;   EYE SURGERY     bilateral cataract    FLEXIBLE SIGMOIDOSCOPY N/A 04/18/2021   Procedure: FLEXIBLE SIGMOIDOSCOPY;  Surgeon: Iva Boop, MD;  Location: [facility] ENDOSCOPY;  Service: Endoscopy;  Laterality: N/A;   HERNIA REPAIR  1959   IR FLUORO GUIDE CV LINE RIGHT  03/18/2021   IR US GUIDE VASC ACCESS RIGHT  03/18/2021   LEFT HEART CATH AND CORONARY ANGIOGRAPHY N/A 04/04/2017   Procedure: Left Heart Cath and Coronary Angiography;  Surgeon: Lyn Records, MD;  Location: [facility] INVASIVE CV LAB;  Service: Cardiovascular;  Laterality: N/A;   MYOMECTOMY  1980, 2004, 2007   PERIPHERAL VASCULAR THROMBECTOMY  02/21/2022   Procedure: PERIPHERAL VASCULAR THROMBECTOMY;  Surgeon: Maeola Harman, MD;  Location: [facility] INVASIVE CV LAB;  Service: Cardiovascular;;  Right  Tibial   RHINOPLASTY  1971   ROTATOR CUFF REPAIR  2003   SURGICAL REPAIR OF HEMORRHAGE  2015   TONSILLECTOMY  1968   Patient Active Problem List   Diagnosis Date Noted   Hx of right BKA (HCC) 03/10/2023   CHF (congestive heart failure) (HCC) 02/10/2023   Dyspnea 02/09/2023   Acute respiratory distress 02/09/2023   Urinary tract infection without hematuria    Diabetes mellitus due to underlying condition with hypoglycemia without coma, with long-term current use of insulin (HCC)    Seizure disorder (HCC) 06/22/2022   Acute respiratory failure (HCC)    Unresponsiveness    Pressure ulcer 06/07/2022   Acute stroke due to ischemia (HCC) 06/03/2022   Chest pain 06/01/2022   History of CVA (cerebrovascular accident) 06/01/2022   Seizure (HCC)    Encephalopathy    Below-knee amputation of right lower extremity (HCC)    Leukocytosis    Foot infection 05/10/2022   PAD (peripheral artery disease) (HCC) 05/10/2022   Gangrene of right foot (HCC) 04/15/2022   Left hemiparesis (HCC) 09/24/2021   Abnormality of gait 09/24/2021   Pain of right hand 08/06/2021   Steal syndrome of dialysis vascular access (HCC) 08/06/2021   Subclavian steal syndrome of right subclavian artery 06/25/2021   Right hand weakness 06/25/2021   Stroke-like symptoms 05/06/2021   Bandemia    Cerebrovascular accident (CVA) of right basal ganglia (HCC) 04/20/2021   Hemorrhoids    Basal ganglia infarction (HCC) 04/16/2021   ESRD on dialysis (HCC) 04/16/2021   Hypertensive urgency 03/10/2021   Hilar enlargement    Anemia of chronic disease    Chronic diastolic CHF (congestive heart failure) (HCC) 03/09/2021   CKD (chronic kidney disease), stage V (HCC) 03/08/2021   Diabetic retinopathy (HCC) 03/08/2021   Hyperglycemia due to type 2 diabetes mellitus (HCC) 03/08/2021   Pure hypercholesterolemia 03/08/2021   Anemia in chronic kidney disease 09/24/2020   Uncontrolled type 2 diabetes mellitus with hyperglycemia (HCC)  03/19/2019   Osteopenia 09/04/2018   Migraine 09/04/2018   Asthma 09/04/2018   Pseudophakia of both eyes 07/17/2018   Pseudophakia of left eye 07/03/2018   Open angle with borderline findings and high glaucoma risk in left eye 07/03/2018   Age-related nuclear cataract of right eye 07/03/2018   CAD (coronary artery disease), native coronary artery 04/27/2017   Abnormal nuclear stress test 04/04/2017   Shortness of breath 03/09/2017   Appendicitis    Age-related hypermature cataract of both eyes 12/20/2016   Uterine leiomyoma 11/27/2012   Dyslipidemia (high LDL; low HDL) 11/25/2011   Obesity (BMI  30-39.9) 11/25/2011   Hypertensive disorder 11/21/2011   Type 2 diabetes mellitus (HCC) 11/21/2011   Abnormal EKG 11/21/2011    ONSET DATE: 12/29/2022 prosthesis delivery  REFERRING DIAG: Z61.096E (ICD-10-CM) - Below-knee amputation of right lower extremity   THERAPY DIAG:  Unsteadiness on feet  Other abnormalities of gait and mobility  Muscle weakness (generalized)  Hemiplegia and hemiparesis following cerebral infarction affecting left non-dominant side (HCC)  Chronic pain of both knees  Rationale for Evaluation and Treatment: Rehabilitation  SUBJECTIVE:   SUBJECTIVE STATEMENT: She has worn prosthesis daily most of awake hours.   PERTINENT HISTORY: CVA / left hemiparesis due to right basal ganglia infarct 7/22, right hip bursitis, anxiety, dCHF, ESRD on HD, HTN, DM, CAD  PAIN:  Are you having pain?  Yes: NPRS scale:   5/10 sitting, complaints of pain with standing but does not rate Pain location: Rt knee Pain description: throbbing Aggravating factors: standing, walking Relieving factors: Voltarin & massage  PRECAUTIONS: Fall and Other: NO BP RUE  WEIGHT BEARING RESTRICTIONS: No  FALLS: Has patient fallen in last 6 months? No  LIVING ENVIRONMENT: Lives with: lives with their spouse and CNAs 24 hrs 7 days / wk Lives in: House Home Access: Ramped entrance Home  layout: Multi level, Full bath on main level, and Able to live on main level with bedroom and bathroom Stairs: Yes: Internal: 16 steps; on left going up and External: 4 steps; on right going up Has following equipment at home: Single point cane, Walker - 2 wheeled, Wheelchair (manual), and shower chair  PLOF: Independent with household mobility with device and Independent with community mobility with device  PATIENT GOALS: walk with prosthesis, stand & transfer.   OBJECTIVE:  COGNITION: Overall cognitive status: History of cognitive impairments - at baseline  POSTURE: rounded shoulders, forward head, decreased lumbar lordosis, increased thoracic kyphosis, and flexed trunk   LOWER EXTREMITY ROM: Eval / 01/05/2023:    BLEs PROM WFL except unable to assess hip ext  (Blank rows = not tested)  LOWER EXTREMITY MMT:  MMT Right eval Right 03/06/23  Hip flexion 3-/5 3-/5  Hip extension 3-/5 3-/5  Hip abduction 3-/5 3-/5  Hip adduction    Hip internal rotation    Hip external rotation    Knee flexion 3-/5 3-/5  Knee extension 3-/5 3-/5  Ankle dorsiflexion NA NA  Ankle plantarflexion NA NA  Ankle inversion NA NA  Ankle eversion NA NA  03/06/2023: LLE hemiparesis but able to advance during gait without assistance.   Eval / 01/05/2023: left LE & UE hemiparesis.  In gait slides to advance LLE and knee / hip instability in stance.  (Blank rows = not tested)  TRANSFERS: 05/09/23 Sit to stand w/c & 18" chair with armrests to RW with modA. Stand to sit with minA. Stand-pivot  transfer with RW with modA / constant cueing. Stand-pivot transfer holding onto PT mod A  03/06/2023:  Sit to stand w/c & 18" chair with armrests to RW with modA. Stand to sit with minA. Stand-pivot  transfer with RW with modA / constant cueing.  02/07/2023: Sit to stand w/c or 18" chair with armrests to RW with modA. Stand to sit with minA. Both require constant cues. Stand-pivot transfer with RW with modA / constant  cueing. (2nd person for safety)   01/05/2023 / Eval: Sit to stand: Max A pulling BUEs on //bars from w/c Stand to sit: Min A using BUEs //bars to control descent Lateral scoot transfers: Mod A  w/c to / from mat table level & adjacent   GAIT: 03/06/2023: Pt amb 5' with RW with +2 modA including 90* turn to chair.  02/07/2023: Pt amb 5' with RW with +2 modA including 90* turn to chair.   01/05/2023 / Eval: Gait pattern: step to pattern, decreased step length- Right, decreased step length- Left, decreased stance time- Right, decreased stance time- Left, decreased hip/knee flexion- Left, decreased ankle dorsiflexion- Left, Right hip hike, Left hip hike, knee flexed in stance- Left, antalgic, lateral hip instability, trunk flexed, wide BOS, and poor foot clearance- Left Distance walked: 3' Assistive device utilized: // bars & RLE TTA prosthesis Level of assistance: Total A / 2 person Comments:  LLE with poor clearance / slides foot forward and knee / hip instability in stance.   FUNCTIONAL TESTs:  03/06/2023: static stance with RW support 60 sec with minA.  Eval / 01/05/2023:  Standing in //bars for 30 sec with modA.    CURRENT PROSTHETIC WEAR ASSESSMENT: 03/06/2023: Patient's husband & CNA correctly don & doff her prosthesis. Pt is dependent requiring maxA to don or doff.  She reports wearing prosthesis 5 hrs 2x/day for 10 hours total on non-dialysis days and up to 8 hours on dialysis days.  Her limb has scab on lateral incision that appears to be internal suture working way out. No signs of infection. Her residual limb appearance is dry skin, normal temperature, limited to no hair growth, cylindrical shape.   Eval / 01/05/2023:  Patient is dependent with: skin check, residual limb care, care of non-amputated limb, prosthetic cleaning, ply sock cleaning, correct ply sock adjustment, proper wear schedule/adjustment, and proper weight-bearing schedule/adjustment Donning prosthesis: Total A /  family & CNA requires 100% cueing Doffing prosthesis: Total A / family & CNA requires 100% cueing Prosthetic wear tolerance: 1-1.5 hours, 1x/day, 6 of 7 days since delivery Prosthetic weight bearing tolerance: 3 minutes during eval with c/o knee pain Edema: pitting Residual limb condition: lateral incision with 3mm X 6mm long scabs with serous drainage, arrived with bandaid over wound,  dry skin, normal temperature & color, cylindrical shape Prosthetic description: silicon liner with pin lock suspension, total contact socket, SACH foot K code/activity level with prosthetic use: Level 1    TODAY'S TREATMENT:                                                                                                                             DATE:  05/16/2023: Prosthetic Training with TTA prosthesis Sit to stand with RW and patient pushing off wheelchair with min to mod assist, requires cues for set up, hand placement, and sequence, X 5 reps total Patient ambulated 23 feet X 2 with rolling walker and min A with 90 deg turn to Rt and left to sit in chair, then another 15 feet X 1 with 90 deg turn, and another 5 feet X 1 with 180 deg turn to chair one rep Measured SPO2 at  98% after ambulation as she did have some short of breath associated with this.   Therapeutic Exercise Leg press DL 40# 9W11, then single leg 31# X10 reps 2 sets each side with manual assistance at left knee to reduce knee varus/hip rotation Nu step L4 X 6 min UE/LE for functional endurance PROM hamstring stretch BLEs 30 sec hold 2 reps ea LE during leg press rests   05/11/2023: Prosthetic Training with TTA prosthesis Sit to stand with RW and patient pushing off wheelchair with min to mod assist, requires cues for set up, hand placement, and sequence, X 4 reps total Patient stood for 2 minutes in RW with close supervision and has now met short term goal for this. Patient ambulated 15 feet X 2 with rolling walker and min A once ambulating  and performed 90 deg 1 rep and 180 deg turn to chair one rep Pt ambulated 30 feet max tolerated distance at end of session with RW and min A once ambulating  Therapeutic Exercise Leg press DL 91# 4N82, then single leg 31# X10 reps 2 sets each side with manual assistance at left knee to reduce knee varus/hip rotation PROM hamstring stretch BLEs 30 sec hold 2 reps ea LE during leg press rests    04/18/2023: PT held session today due to patient's condition with extreme fatigue. See subjective for issues.  04/13/2023: Prosthetic Training with TTA prosthesis Sit to stand with RW and patient pushing off wheelchair with min to mod assist, requires cues for set up, hand placement, and sequence, X 4 reps total Patient ambulated 10 feet X 3 with rolling walker and min A once ambulating and then performed 90 deg turns to chair.  Stand pivot transfer from chair with arm rests to other chair with armrests 4 reps total, 2 reps to Rt, and 2 reps to left. She appears to have easier time moving to her Rt compared to left. Requires overall mod A to her Rt and mod to max to her Lt.  Stand pivot transfers from Wheelchair to leg press with RW and mod A overall for turning, and RW management and then once sitting on leg press chair, max A for management of placing legs on leg press platform moving from sittting to supine at 45 deg incline. Leg press DL 95# 6O13, then single leg 31# X10 each side with manual assistance at left knee to reduce knee varus/hip rotation  04/11/2023: Prosthetic Training with TTA prosthesis Sit to stand with RW and patient pushing off wheelchair with min to mod assist, requires cues for set up, hand placement, and sequence, X 6 reps total Patient ambulated 10 feet X 3 with rolling walker and min A once ambulating PT educated pt & husband in adjusting ply socks with donning ease, number of clicks in sitting, perceived height in gait, pressure in gait and depth of patella after walking so limb  is seated in socket. With too few, too many and correct ply socks.  Pt & husband verbalized understanding.  Stand pivot transfers from Wheelchair to leg press with RW and mod A overall for turning, and RW management and then once sitting on leg press chair, max A for management of placing legs on leg press platform moving from sittting to supine at 45 deg incline. Leg press DL 08# 6V78, then single leg 31# X10 reps each side with manual assistance at left knee to reduce knee varus/hip IT   HOME EXERCISE PROGRAM:  ASSESSMENT:  CLINICAL IMPRESSION: She showed  some improvements today with overall less asssistance needed for sit to stand, ambulation, and turning to make transfers. She does still need cuing for technique as well as max encouragement to progress her standing time and ambulation distance.   OBJECTIVE IMPAIRMENTS: Abnormal gait, decreased activity tolerance, decreased balance, decreased cognition, decreased coordination, decreased endurance, decreased knowledge of condition, decreased knowledge of use of DME, decreased mobility, difficulty walking, decreased ROM, decreased strength, postural dysfunction, prosthetic dependency , obesity, and pain.   ACTIVITY LIMITATIONS: standing, stairs, transfers, locomotion level, and safe prosthesis use.  PARTICIPATION LIMITATIONS: community activity, church, and household mobility  PERSONAL FACTORS: Age, Fitness, Past/current experiences, Time since onset of injury/illness/exacerbation, and 3+ comorbidities: see PMH  are also affecting patient's functional outcome.   REHAB POTENTIAL: Good  CLINICAL DECISION MAKING: Evolving/moderate complexity  EVALUATION COMPLEXITY: Moderate   GOALS: Goals reviewed with patient? Yes  SHORT TERM GOALS: Target date: 04/06/2023  Patient reports how to correctly adjust dialysis weight and and weigh out to include prosthesis. Baseline: SEE OBJECTIVE DATA Goal status: Met 04/04/2023 2.  Patient tolerates  prosthesis >90% of awake hrs/day without increase in skin issues or limb / knee pain <5/10 with standing. Baseline: SEE OBJECTIVE DATA Goal status:  Met 04/04/2023  3.  Patient stand pivot transfer with RW with minA. Baseline: SEE OBJECTIVE DATA Goal status: partially MET 04/04/2023  4. Patient ambulates 10' including turning 90* to position to sit with RW with modA of one person.  Baseline: SEE OBJECTIVE DATA Goal status: Met 04/04/2023    LONG TERM GOALS: Target date: 06/01/2023  Patient & family / CNA demonstrates & verbalized understanding of prosthetic care to enable safe utilization of prosthesis. Baseline: SEE OBJECTIVE DATA Goal status: ongoing 05/09/2023  Patient tolerates prosthesis wear >90% of awake hours without skin or limb pain issues and knee pain </= 4/10.  Baseline: SEE OBJECTIVE DATA Goal status:  MET 05/09/23 She reports she wears it all day now except for about 15 minutes she takes it off due to becoming too hot.   Stand balance with walker: static 2 min with supervision to enable ability for standing ADLS. Baseline: SEE OBJECTIVE DATA Goal status: MET 05/09/2023  Patient ambulates >50' with RW & prosthesis only with family / CNA assist safely.  Baseline: SEE OBJECTIVE DATA Goal status: ongoing 05/02/2023  Patient transfers stand pivot including sit to/from stand with RW with minA or less.  Baseline: SEE OBJECTIVE DATA Goal status:  ongoing 05/09/2023, mod A overall.    PLAN:  PT FREQUENCY: 2x/week  PT DURATION: 13 weeks / 90 days  PLANNED INTERVENTIONS: Therapeutic exercises, Therapeutic activity, Neuromuscular re-education, Balance training, Gait training, Patient/Family education, Self Care, Vestibular training, Prosthetic training, DME instructions, and Manual therapy  PLAN FOR NEXT SESSION:  Continue PT plan Look for human re-auth. Work on patient walking between 2 chairs and decreasing need for wheelchair to be followed behind patient.  Continue gait and  transfers including stand pivot with rolling walker, leg strength, balance  Referring diagnosis? Z61.096E (ICD-10-CM) - Below-knee amputation of right lower extremity  Treatment diagnosis? (if different than referring diagnosis)  Unsteadiness on feet R26.81   Other abnormalities of gait and mobility R26.89   Muscle weakness (generalized) M62.81   Hemiplegia and hemiparesis following cerebral infarction affecting left non-dominant side (HCC) I69.354   Non-pressure chronic ulcer of skin of other sites with other specified severity (HCC) L98.498   Chronic pain of both knees M25.561, M25.562, G89.29   What was this (referring dx)  caused by? [x]  Surgery []  Fall []  Ongoing issue []  Arthritis []  Other: ____________   Laterality: [x]  Rt []  Lt []  Both   Check all possible CPT codes:                   *CHOOSE 10 OR LESS*                                [x]  82956 (Therapeutic Exercise)                   []  92507 (SLP Treatment)      [x]  21308 (Neuro Re-ed)                                []  92526 (Swallowing Treatment)                  [x]  97116 (Gait Training)                                []  [id number] (Cognitive Training, 1st 15 minutes) []  97140 (Manual Therapy)                           []  97130 (Cognitive Training, each add'l 15 minutes)         [x]  97164 (Re-evaluation)                              []  Other, List CPT Code ____________  [x]  97530 (Therapeutic Activities)                                            [x]  97535 (Self Care)               []  All codes above (97110 - 97535)             []  97012 (Mechanical Traction)             []  97014 (E-stim Unattended)             []  97032 (E-stim manual)             []  97033 (Ionto)             []  97035 (Ultrasound) []  97750 (Physical Performance Training) []  [id number] (Aquatic Therapy) []  97016 (Vasopneumatic Device) []  [id number] (Paraffin) []  97034 (Contrast Bath) []  97597 (Wound Care 1st 20 sq cm) []  97598 (Wound Care each add'l 20 sq  cm) []  97760 (Orthotic Fabrication, Fitting, Training Initial) [x]  [id number] (Prosthetic Management and Training Initial) []  [id number] (Orthotic or Prosthetic Training/ Modification Subsequent)  April Manson, PT, DPT 05/16/2023, 4:54 PM

## 2023-05-18 ENCOUNTER — Encounter: Payer: Self-pay | Admitting: Physical Therapy

## 2023-05-18 ENCOUNTER — Encounter (INDEPENDENT_AMBULATORY_CARE_PROVIDER_SITE_OTHER): Payer: Medicare PPO | Admitting: Ophthalmology

## 2023-05-18 ENCOUNTER — Ambulatory Visit: Payer: Medicare PPO | Admitting: Physical Therapy

## 2023-05-18 DIAGNOSIS — H35033 Hypertensive retinopathy, bilateral: Secondary | ICD-10-CM

## 2023-05-18 DIAGNOSIS — E113513 Type 2 diabetes mellitus with proliferative diabetic retinopathy with macular edema, bilateral: Secondary | ICD-10-CM | POA: Diagnosis not present

## 2023-05-18 DIAGNOSIS — H4312 Vitreous hemorrhage, left eye: Secondary | ICD-10-CM | POA: Diagnosis not present

## 2023-05-18 DIAGNOSIS — H43813 Vitreous degeneration, bilateral: Secondary | ICD-10-CM

## 2023-05-18 DIAGNOSIS — I69354 Hemiplegia and hemiparesis following cerebral infarction affecting left non-dominant side: Secondary | ICD-10-CM

## 2023-05-18 DIAGNOSIS — I10 Essential (primary) hypertension: Secondary | ICD-10-CM

## 2023-05-18 DIAGNOSIS — R2681 Unsteadiness on feet: Secondary | ICD-10-CM | POA: Diagnosis not present

## 2023-05-18 DIAGNOSIS — M6281 Muscle weakness (generalized): Secondary | ICD-10-CM

## 2023-05-18 DIAGNOSIS — R2689 Other abnormalities of gait and mobility: Secondary | ICD-10-CM

## 2023-05-18 DIAGNOSIS — Z794 Long term (current) use of insulin: Secondary | ICD-10-CM

## 2023-05-18 NOTE — Therapy (Signed)
OUTPATIENT PHYSICAL THERAPY TREATMENT    Patient Name: Carolyn Russo MRN: [id number] DOB:1951/05/01, 72 y.o., female Today's Date: 05/18/2023  PCP: No PCP on file REFERRING PROVIDER: Aldean Baker, MD     END OF SESSION:  PT End of Session - 05/18/23 1605     Visit Number 23    Number of Visits 36    Date for PT Re-Evaluation 06/01/23    Authorization Type [employer] PPO    Authorization Time Period 12 more visits approaved 7/18 to 8/8    Authorization - Visit Number 2    Authorization - Number of Visits 12    Progress Note Due on Visit 30    PT Start Time 1604    PT Stop Time 1645    PT Time Calculation (min) 41 min    Equipment Utilized During Treatment Gait belt    Activity Tolerance Patient limited by fatigue    Behavior During Therapy WFL for tasks assessed/performed                 Past Medical History:  Diagnosis Date   Acute GI bleeding    Allergy    Anemia    Anterior chest wall pain    Appendicitis 1965   Asthma    Body mass index 37.0-37.9, adult    Breast pain    Cataract    both eyes   CHF (congestive heart failure) (HCC)    Chronic kidney disease    stage 5 - on dialysis   Cognitive change 04/20/2021   r/t cva 03/2821   Complication of anesthesia    Memory loss after general   Dehydration 2014   Deviated septum 1971   Diabetes mellitus    Dysphagia due to old stroke    easy to get strangled when eating   Dyspnea 2014   Extrinsic asthma    WITH ASTHMA ATTACK   Fibroid 1980   GERD (gastroesophageal reflux disease)    Heart murmur    History of migraine    History of seizure    with stroke   Hx gestational diabetes    Hyperlipidemia    Hypertension 2014   Inguinal hernia 1959   Malaise and fatigue 2014   Non-IgE mediated allergic asthma 2014   Obesity    Pelvic pain    Pregnancy, high-risk 1985   Stroke (HCC) 04/20/2021   (CVA) of right basal ganglia   Tonsillitis 1968   Uterine fibroid 1980   Visual field  defect    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   Past Surgical History:  Procedure Laterality Date   ABDOMINAL AORTOGRAM W/LOWER EXTREMITY N/A 02/21/2022   Procedure: ABDOMINAL AORTOGRAM W/LOWER EXTREMITY;  Surgeon: Maeola Harman, MD;  Location: [facility] INVASIVE CV LAB;  Service: Cardiovascular;  Laterality: N/A;   AMPUTATION Right 05/18/2022   Procedure: RIGHT BELOW KNEE AMPUTATION;  Surgeon: Nadara Mustard, MD;  Location: [facility] OR;  Service: Orthopedics;  Laterality: Right;   AMPUTATION TOE Right 04/15/2022   Procedure: AMPUTATION  LST TOERIGHT FOOT;  Surgeon: Edwin Cap, DPM;  Location: [facility] ORS;  Service: Podiatry;  Laterality: Right;   APPENDECTOMY  1959   BASCILIC VEIN TRANSPOSITION Right 04/30/2021   Procedure: RIGHT FIRST STAGE BASCILIC VEIN TRANSPOSITION;  Surgeon: Chuck Hint, MD;  Location: [facility] OR;  Service: Vascular;  Laterality: Right;   CESAREAN SECTION  1985   COLONOSCOPY     ESOPHAGOGASTRODUODENOSCOPY (EGD) WITH PROPOFOL N/A 04/18/2021   Procedure: ESOPHAGOGASTRODUODENOSCOPY (  EGD) WITH PROPOFOL;  Surgeon: Iva Boop, MD;  Location: [facility] ENDOSCOPY;  Service: Endoscopy;  Laterality: N/A;   EYE SURGERY     bilateral cataract    FLEXIBLE SIGMOIDOSCOPY N/A 04/18/2021   Procedure: FLEXIBLE SIGMOIDOSCOPY;  Surgeon: Iva Boop, MD;  Location: [facility] ENDOSCOPY;  Service: Endoscopy;  Laterality: N/A;   HERNIA REPAIR  1959   IR FLUORO GUIDE CV LINE RIGHT  03/18/2021   IR US GUIDE VASC ACCESS RIGHT  03/18/2021   LEFT HEART CATH AND CORONARY ANGIOGRAPHY N/A 04/04/2017   Procedure: Left Heart Cath and Coronary Angiography;  Surgeon: Lyn Records, MD;  Location: [facility] INVASIVE CV LAB;  Service: Cardiovascular;  Laterality: N/A;   MYOMECTOMY  1980, 2004, 2007   PERIPHERAL VASCULAR THROMBECTOMY  02/21/2022   Procedure: PERIPHERAL VASCULAR THROMBECTOMY;  Surgeon: Maeola Harman, MD;  Location: [facility] INVASIVE CV LAB;  Service: Cardiovascular;;  Right Tibial   RHINOPLASTY  1971   ROTATOR  CUFF REPAIR  2003   SURGICAL REPAIR OF HEMORRHAGE  2015   TONSILLECTOMY  1968   Patient Active Problem List   Diagnosis Date Noted   Hx of right BKA (HCC) 03/10/2023   CHF (congestive heart failure) (HCC) 02/10/2023   Dyspnea 02/09/2023   Acute respiratory distress 02/09/2023   Urinary tract infection without hematuria    Diabetes mellitus due to underlying condition with hypoglycemia without coma, with long-term current use of insulin (HCC)    Seizure disorder (HCC) 06/22/2022   Acute respiratory failure (HCC)    Unresponsiveness    Pressure ulcer 06/07/2022   Acute stroke due to ischemia (HCC) 06/03/2022   Chest pain 06/01/2022   History of CVA (cerebrovascular accident) 06/01/2022   Seizure (HCC)    Encephalopathy    Below-knee amputation of right lower extremity (HCC)    Leukocytosis    Foot infection 05/10/2022   PAD (peripheral artery disease) (HCC) 05/10/2022   Gangrene of right foot (HCC) 04/15/2022   Left hemiparesis (HCC) 09/24/2021   Abnormality of gait 09/24/2021   Pain of right hand 08/06/2021   Steal syndrome of dialysis vascular access (HCC) 08/06/2021   Subclavian steal syndrome of right subclavian artery 06/25/2021   Right hand weakness 06/25/2021   Stroke-like symptoms 05/06/2021   Bandemia    Cerebrovascular accident (CVA) of right basal ganglia (HCC) 04/20/2021   Hemorrhoids    Basal ganglia infarction (HCC) 04/16/2021   ESRD on dialysis (HCC) 04/16/2021   Hypertensive urgency 03/10/2021   Hilar enlargement    Anemia of chronic disease    Chronic diastolic CHF (congestive heart failure) (HCC) 03/09/2021   CKD (chronic kidney disease), stage V (HCC) 03/08/2021   Diabetic retinopathy (HCC) 03/08/2021   Hyperglycemia due to type 2 diabetes mellitus (HCC) 03/08/2021   Pure hypercholesterolemia 03/08/2021   Anemia in chronic kidney disease 09/24/2020   Uncontrolled type 2 diabetes mellitus with hyperglycemia (HCC) 03/19/2019   Osteopenia 09/04/2018    Migraine 09/04/2018   Asthma 09/04/2018   Pseudophakia of both eyes 07/17/2018   Pseudophakia of left eye 07/03/2018   Open angle with borderline findings and high glaucoma risk in left eye 07/03/2018   Age-related nuclear cataract of right eye 07/03/2018   CAD (coronary artery disease), native coronary artery 04/27/2017   Abnormal nuclear stress test 04/04/2017   Shortness of breath 03/09/2017   Appendicitis    Age-related hypermature cataract of both eyes 12/20/2016   Uterine leiomyoma 11/27/2012   Dyslipidemia (high LDL; low HDL) 11/25/2011  Obesity (BMI 30-39.9) 11/25/2011   Hypertensive disorder 11/21/2011   Type 2 diabetes mellitus (HCC) 11/21/2011   Abnormal EKG 11/21/2011    ONSET DATE: 12/29/2022 prosthesis delivery  REFERRING DIAG: Z61.096E (ICD-10-CM) - Below-knee amputation of right lower extremity   THERAPY DIAG:  Unsteadiness on feet  Other abnormalities of gait and mobility  Muscle weakness (generalized)  Hemiplegia and hemiparesis following cerebral infarction affecting left non-dominant side (HCC)  Rationale for Evaluation and Treatment: Rehabilitation  SUBJECTIVE:   SUBJECTIVE STATEMENT: She is having some back pain today, does not rate but insits it limits her standing and needs to sit.   PERTINENT HISTORY: CVA / left hemiparesis due to right basal ganglia infarct 7/22, right hip bursitis, anxiety, dCHF, ESRD on HD, HTN, DM, CAD  PAIN:  Are you having pain?  Yes: NPRS scale:   6/10 sitting, complaints of pain with standing but does not rate Pain location: Rt knee Pain description: throbbing Aggravating factors: standing, walking Relieving factors: Voltarin & massage  PRECAUTIONS: Fall and Other: NO BP RUE  WEIGHT BEARING RESTRICTIONS: No  FALLS: Has patient fallen in last 6 months? No  LIVING ENVIRONMENT: Lives with: lives with their spouse and CNAs 24 hrs 7 days / wk Lives in: House Home Access: Ramped entrance Home layout: Multi level,  Full bath on main level, and Able to live on main level with bedroom and bathroom Stairs: Yes: Internal: 16 steps; on left going up and External: 4 steps; on right going up Has following equipment at home: Single point cane, Walker - 2 wheeled, Wheelchair (manual), and shower chair  PLOF: Independent with household mobility with device and Independent with community mobility with device  PATIENT GOALS: walk with prosthesis, stand & transfer.   OBJECTIVE:  COGNITION: Overall cognitive status: History of cognitive impairments - at baseline  POSTURE: rounded shoulders, forward head, decreased lumbar lordosis, increased thoracic kyphosis, and flexed trunk   LOWER EXTREMITY ROM: Eval / 01/05/2023:    BLEs PROM WFL except unable to assess hip ext  (Blank rows = not tested)  LOWER EXTREMITY MMT:  MMT Right eval Right 03/06/23  Hip flexion 3-/5 3-/5  Hip extension 3-/5 3-/5  Hip abduction 3-/5 3-/5  Hip adduction    Hip internal rotation    Hip external rotation    Knee flexion 3-/5 3-/5  Knee extension 3-/5 3-/5  Ankle dorsiflexion NA NA  Ankle plantarflexion NA NA  Ankle inversion NA NA  Ankle eversion NA NA  03/06/2023: LLE hemiparesis but able to advance during gait without assistance.   Eval / 01/05/2023: left LE & UE hemiparesis.  In gait slides to advance LLE and knee / hip instability in stance.  (Blank rows = not tested)  TRANSFERS: 05/09/23 Sit to stand w/c & 18" chair with armrests to RW with modA. Stand to sit with minA. Stand-pivot  transfer with RW with modA / constant cueing. Stand-pivot transfer holding onto PT mod A  03/06/2023:  Sit to stand w/c & 18" chair with armrests to RW with modA. Stand to sit with minA. Stand-pivot  transfer with RW with modA / constant cueing.  02/07/2023: Sit to stand w/c or 18" chair with armrests to RW with modA. Stand to sit with minA. Both require constant cues. Stand-pivot transfer with RW with modA / constant cueing. (2nd person  for safety)   01/05/2023 / Eval: Sit to stand: Max A pulling BUEs on //bars from w/c Stand to sit: Min A using BUEs //bars to  control descent Lateral scoot transfers: Mod A w/c to / from mat table level & adjacent   GAIT: 03/06/2023: Pt amb 5' with RW with +2 modA including 90* turn to chair.  02/07/2023: Pt amb 5' with RW with +2 modA including 90* turn to chair.   01/05/2023 / Eval: Gait pattern: step to pattern, decreased step length- Right, decreased step length- Left, decreased stance time- Right, decreased stance time- Left, decreased hip/knee flexion- Left, decreased ankle dorsiflexion- Left, Right hip hike, Left hip hike, knee flexed in stance- Left, antalgic, lateral hip instability, trunk flexed, wide BOS, and poor foot clearance- Left Distance walked: 3' Assistive device utilized: // bars & RLE TTA prosthesis Level of assistance: Total A / 2 person Comments:  LLE with poor clearance / slides foot forward and knee / hip instability in stance.   FUNCTIONAL TESTs:  03/06/2023: static stance with RW support 60 sec with minA.  Eval / 01/05/2023:  Standing in //bars for 30 sec with modA.    CURRENT PROSTHETIC WEAR ASSESSMENT: 03/06/2023: Patient's husband & CNA correctly don & doff her prosthesis. Pt is dependent requiring maxA to don or doff.  She reports wearing prosthesis 5 hrs 2x/day for 10 hours total on non-dialysis days and up to 8 hours on dialysis days.  Her limb has scab on lateral incision that appears to be internal suture working way out. No signs of infection. Her residual limb appearance is dry skin, normal temperature, limited to no hair growth, cylindrical shape.   Eval / 01/05/2023:  Patient is dependent with: skin check, residual limb care, care of non-amputated limb, prosthetic cleaning, ply sock cleaning, correct ply sock adjustment, proper wear schedule/adjustment, and proper weight-bearing schedule/adjustment Donning prosthesis: Total A / family & CNA requires  100% cueing Doffing prosthesis: Total A / family & CNA requires 100% cueing Prosthetic wear tolerance: 1-1.5 hours, 1x/day, 6 of 7 days since delivery Prosthetic weight bearing tolerance: 3 minutes during eval with c/o knee pain Edema: pitting Residual limb condition: lateral incision with 3mm X 6mm long scabs with serous drainage, arrived with bandaid over wound,  dry skin, normal temperature & color, cylindrical shape Prosthetic description: silicon liner with pin lock suspension, total contact socket, SACH foot K code/activity level with prosthetic use: Level 1    TODAY'S TREATMENT:                                                                                                                             DATE:  05/18/2023: Prosthetic Training with TTA prosthesis Sit to stand with RW and patient pushing off wheelchair with min to mod assist, requires cues for set up, hand placement, and sequence, X 5 reps total Patient ambulated 32 feet, 20 feet, and 25 feet all with rolling walker and min A with 90 deg turn to Rt and left to sit in chair Standing at sink with one UE support and reaching up with Rt arm to place small  bottle of water on/off of shelf X 5 then left arm raising arm only as high as she can X 5 reps. One seated rest break then performed one more round of this.    Therapeutic Exercise Leg press DL 95# 6L87, then single leg 31# X10 reps 2 sets each side with manual assistance at left knee to reduce knee varus/hip rotation Nu step L4 X 7 min UE/LE for functional endurance  05/16/2023: Prosthetic Training with TTA prosthesis Sit to stand with RW and patient pushing off wheelchair with min to mod assist, requires cues for set up, hand placement, and sequence, X 5 reps total Patient ambulated 23 feet X 2 with rolling walker and min A with 90 deg turn to Rt and left to sit in chair, then another 15 feet X 1 with 90 deg turn, and another 5 feet X 1 with 180 deg turn to chair one  rep Measured SPO2 at 98% after ambulation as she did have some short of breath associated with this.   Therapeutic Exercise Leg press DL 56# 4P32, then single leg 31# X10 reps 2 sets each side with manual assistance at left knee to reduce knee varus/hip rotation Nu step L4 X 6 min UE/LE for functional endurance PROM hamstring stretch BLEs 30 sec hold 2 reps ea LE during leg press rests   HOME EXERCISE PROGRAM:  ASSESSMENT:  CLINICAL IMPRESSION: She again showed some improvements in her walking and standing tolerance however this is still limited by reports of back and knee pain as well as poor endurance that we will continue to work to improve with PT.  OBJECTIVE IMPAIRMENTS: Abnormal gait, decreased activity tolerance, decreased balance, decreased cognition, decreased coordination, decreased endurance, decreased knowledge of condition, decreased knowledge of use of DME, decreased mobility, difficulty walking, decreased ROM, decreased strength, postural dysfunction, prosthetic dependency , obesity, and pain.   ACTIVITY LIMITATIONS: standing, stairs, transfers, locomotion level, and safe prosthesis use.  PARTICIPATION LIMITATIONS: community activity, church, and household mobility  PERSONAL FACTORS: Age, Fitness, Past/current experiences, Time since onset of injury/illness/exacerbation, and 3+ comorbidities: see PMH  are also affecting patient's functional outcome.   REHAB POTENTIAL: Good  CLINICAL DECISION MAKING: Evolving/moderate complexity  EVALUATION COMPLEXITY: Moderate   GOALS: Goals reviewed with patient? Yes  SHORT TERM GOALS: Target date: 04/06/2023  Patient reports how to correctly adjust dialysis weight and and weigh out to include prosthesis. Baseline: SEE OBJECTIVE DATA Goal status: Met 04/04/2023 2.  Patient tolerates prosthesis >90% of awake hrs/day without increase in skin issues or limb / knee pain <5/10 with standing. Baseline: SEE OBJECTIVE DATA Goal status:   Met 04/04/2023  3.  Patient stand pivot transfer with RW with minA. Baseline: SEE OBJECTIVE DATA Goal status: partially MET 04/04/2023  4. Patient ambulates 10' including turning 90* to position to sit with RW with modA of one person.  Baseline: SEE OBJECTIVE DATA Goal status: Met 04/04/2023    LONG TERM GOALS: Target date: 06/01/2023  Patient & family / CNA demonstrates & verbalized understanding of prosthetic care to enable safe utilization of prosthesis. Baseline: SEE OBJECTIVE DATA Goal status: ongoing 05/09/2023  Patient tolerates prosthesis wear >90% of awake hours without skin or limb pain issues and knee pain </= 4/10.  Baseline: SEE OBJECTIVE DATA Goal status:  MET 05/09/23 She reports she wears it all day now except for about 15 minutes she takes it off due to becoming too hot.   Stand balance with walker: static 2 min with supervision to  enable ability for standing ADLS. Baseline: SEE OBJECTIVE DATA Goal status: MET 05/09/2023  Patient ambulates >50' with RW & prosthesis only with family / CNA assist safely.  Baseline: SEE OBJECTIVE DATA Goal status: ongoing 05/02/2023  Patient transfers stand pivot including sit to/from stand with RW with minA or less.  Baseline: SEE OBJECTIVE DATA Goal status:  ongoing 05/09/2023, mod A overall.    PLAN:  PT FREQUENCY: 2x/week  PT DURATION: 13 weeks / 90 days  PLANNED INTERVENTIONS: Therapeutic exercises, Therapeutic activity, Neuromuscular re-education, Balance training, Gait training, Patient/Family education, Self Care, Vestibular training, Prosthetic training, DME instructions, and Manual therapy  PLAN FOR NEXT SESSION:  Work on patient walking between 2 chairs and decreasing need for wheelchair to be followed behind patient.  Continue gait and transfers including stand pivot with rolling walker, leg strength, balance  Referring diagnosis? Z61.096E (ICD-10-CM) - Below-knee amputation of right lower extremity  Treatment diagnosis?  (if different than referring diagnosis)  Unsteadiness on feet R26.81   Other abnormalities of gait and mobility R26.89   Muscle weakness (generalized) M62.81   Hemiplegia and hemiparesis following cerebral infarction affecting left non-dominant side (HCC) I69.354   Non-pressure chronic ulcer of skin of other sites with other specified severity [facility]) L98.498   Chronic pain of both knees M25.561, M25.562, G89.29   What was this (referring dx) caused by? [x]  Surgery []  Fall []  Ongoing issue []  Arthritis []  Other: ____________   Laterality: [x]  Rt []  Lt []  Both   Check all possible CPT codes:                   *CHOOSE 10 OR LESS*                                [x]  97110 (Therapeutic Exercise)                   []  92507 (SLP Treatment)      [x]  45409 (Neuro Re-ed)                                []  81191 (Swallowing Treatment)                  [x]  97116 (Gait Training)                                []  [id number] (Cognitive Training, 1st 15 minutes) []  97140 (Manual Therapy)                           []  97130 (Cognitive Training, each add'l 15 minutes)         [x]  97164 (Re-evaluation)                              []  Other, List CPT Code ____________  [x]  97530 (Therapeutic Activities)                                            [x]  97535 (Self Care)               []  All codes above (97110 -  16109)             []  [phone] (Mechanical Traction)             []  97014 (E-stim Unattended)             []  97032 (E-stim manual)             []  97033 (Ionto)             []  97035 (Ultrasound) []  97750 (Physical Performance Training) []  [id number] (Aquatic Therapy) []  97016 (Vasopneumatic Device) []  [id number] (Paraffin) []  97034 (Contrast Bath) []  [phone] (Wound Care 1st 20 sq cm) []  97598 (Wound Care each add'l 20 sq cm) []  97760 (Orthotic Fabrication, Fitting, Training Initial) [x]  [id number] (Prosthetic Management and Training Initial) []  [id number] (Orthotic or Prosthetic Training/ Modification  Subsequent)  April Manson, PT, DPT 05/18/2023, 4:35 PM

## 2023-05-23 ENCOUNTER — Encounter: Payer: Self-pay | Admitting: Physical Therapy

## 2023-05-23 ENCOUNTER — Ambulatory Visit: Payer: Medicare PPO | Admitting: Physical Therapy

## 2023-05-23 DIAGNOSIS — I69354 Hemiplegia and hemiparesis following cerebral infarction affecting left non-dominant side: Secondary | ICD-10-CM

## 2023-05-23 DIAGNOSIS — M25561 Pain in right knee: Secondary | ICD-10-CM

## 2023-05-23 DIAGNOSIS — M6281 Muscle weakness (generalized): Secondary | ICD-10-CM | POA: Diagnosis not present

## 2023-05-23 DIAGNOSIS — M25562 Pain in left knee: Secondary | ICD-10-CM

## 2023-05-23 DIAGNOSIS — G8929 Other chronic pain: Secondary | ICD-10-CM

## 2023-05-23 DIAGNOSIS — R2689 Other abnormalities of gait and mobility: Secondary | ICD-10-CM

## 2023-05-23 DIAGNOSIS — R2681 Unsteadiness on feet: Secondary | ICD-10-CM

## 2023-05-23 NOTE — Therapy (Signed)
OUTPATIENT PHYSICAL THERAPY TREATMENT    Patient Name: Carolyn Russo MRN: [id number] DOB:17-Mar-1951, 72 y.o., female Today's Date: 05/23/2023  PCP: No PCP on file REFERRING PROVIDER: Aldean Baker, MD     END OF SESSION:  PT End of Session - 05/23/23 1600     Visit Number 24    Number of Visits 36    Date for PT Re-Evaluation 06/01/23    Authorization Type [employer] PPO    Authorization Time Period 12 more visits approaved 7/18 to 8/8    Authorization - Visit Number 3    Authorization - Number of Visits 12    Progress Note Due on Visit 30    PT Start Time 1600    PT Stop Time 1645    PT Time Calculation (min) 45 min    Equipment Utilized During Treatment Gait belt    Activity Tolerance Patient limited by fatigue    Behavior During Therapy WFL for tasks assessed/performed                 Past Medical History:  Diagnosis Date   Acute GI bleeding    Allergy    Anemia    Anterior chest wall pain    Appendicitis 1965   Asthma    Body mass index 37.0-37.9, adult    Breast pain    Cataract    both eyes   CHF (congestive heart failure) (HCC)    Chronic kidney disease    stage 5 - on dialysis   Cognitive change 04/20/2021   r/t cva 03/2821   Complication of anesthesia    Memory loss after general   Dehydration 2014   Deviated septum 1971   Diabetes mellitus    Dysphagia due to old stroke    easy to get strangled when eating   Dyspnea 2014   Extrinsic asthma    WITH ASTHMA ATTACK   Fibroid 1980   GERD (gastroesophageal reflux disease)    Heart murmur    History of migraine    History of seizure    with stroke   Hx gestational diabetes    Hyperlipidemia    Hypertension 2014   Inguinal hernia 1959   Malaise and fatigue 2014   Non-IgE mediated allergic asthma 2014   Obesity    Pelvic pain    Pregnancy, high-risk 1985   Stroke (HCC) 04/20/2021   (CVA) of right basal ganglia   Tonsillitis 1968   Uterine fibroid 1980   Visual field  defect    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   Past Surgical History:  Procedure Laterality Date   ABDOMINAL AORTOGRAM W/LOWER EXTREMITY N/A 02/21/2022   Procedure: ABDOMINAL AORTOGRAM W/LOWER EXTREMITY;  Surgeon: Maeola Harman, MD;  Location: [facility] INVASIVE CV LAB;  Service: Cardiovascular;  Laterality: N/A;   AMPUTATION Right 05/18/2022   Procedure: RIGHT BELOW KNEE AMPUTATION;  Surgeon: Nadara Mustard, MD;  Location: [facility] OR;  Service: Orthopedics;  Laterality: Right;   AMPUTATION TOE Right 04/15/2022   Procedure: AMPUTATION  LST TOERIGHT FOOT;  Surgeon: Edwin Cap, DPM;  Location: [facility] ORS;  Service: Podiatry;  Laterality: Right;   APPENDECTOMY  1959   BASCILIC VEIN TRANSPOSITION Right 04/30/2021   Procedure: RIGHT FIRST STAGE BASCILIC VEIN TRANSPOSITION;  Surgeon: Chuck Hint, MD;  Location: [facility] OR;  Service: Vascular;  Laterality: Right;   CESAREAN SECTION  1985   COLONOSCOPY     ESOPHAGOGASTRODUODENOSCOPY (EGD) WITH PROPOFOL N/A 04/18/2021   Procedure: ESOPHAGOGASTRODUODENOSCOPY (  EGD) WITH PROPOFOL;  Surgeon: Iva Boop, MD;  Location: [facility] ENDOSCOPY;  Service: Endoscopy;  Laterality: N/A;   EYE SURGERY     bilateral cataract    FLEXIBLE SIGMOIDOSCOPY N/A 04/18/2021   Procedure: FLEXIBLE SIGMOIDOSCOPY;  Surgeon: Iva Boop, MD;  Location: [facility] ENDOSCOPY;  Service: Endoscopy;  Laterality: N/A;   HERNIA REPAIR  1959   IR FLUORO GUIDE CV LINE RIGHT  03/18/2021   IR US GUIDE VASC ACCESS RIGHT  03/18/2021   LEFT HEART CATH AND CORONARY ANGIOGRAPHY N/A 04/04/2017   Procedure: Left Heart Cath and Coronary Angiography;  Surgeon: Lyn Records, MD;  Location: [facility] INVASIVE CV LAB;  Service: Cardiovascular;  Laterality: N/A;   MYOMECTOMY  1980, 2004, 2007   PERIPHERAL VASCULAR THROMBECTOMY  02/21/2022   Procedure: PERIPHERAL VASCULAR THROMBECTOMY;  Surgeon: Maeola Harman, MD;  Location: [facility] INVASIVE CV LAB;  Service: Cardiovascular;;  Right Tibial   RHINOPLASTY  1971   ROTATOR  CUFF REPAIR  2003   SURGICAL REPAIR OF HEMORRHAGE  2015   TONSILLECTOMY  1968   Patient Active Problem List   Diagnosis Date Noted   Hx of right BKA (HCC) 03/10/2023   CHF (congestive heart failure) (HCC) 02/10/2023   Dyspnea 02/09/2023   Acute respiratory distress 02/09/2023   Urinary tract infection without hematuria    Diabetes mellitus due to underlying condition with hypoglycemia without coma, with long-term current use of insulin (HCC)    Seizure disorder (HCC) 06/22/2022   Acute respiratory failure (HCC)    Unresponsiveness    Pressure ulcer 06/07/2022   Acute stroke due to ischemia (HCC) 06/03/2022   Chest pain 06/01/2022   History of CVA (cerebrovascular accident) 06/01/2022   Seizure (HCC)    Encephalopathy    Below-knee amputation of right lower extremity (HCC)    Leukocytosis    Foot infection 05/10/2022   PAD (peripheral artery disease) (HCC) 05/10/2022   Gangrene of right foot (HCC) 04/15/2022   Left hemiparesis (HCC) 09/24/2021   Abnormality of gait 09/24/2021   Pain of right hand 08/06/2021   Steal syndrome of dialysis vascular access (HCC) 08/06/2021   Subclavian steal syndrome of right subclavian artery 06/25/2021   Right hand weakness 06/25/2021   Stroke-like symptoms 05/06/2021   Bandemia    Cerebrovascular accident (CVA) of right basal ganglia (HCC) 04/20/2021   Hemorrhoids    Basal ganglia infarction (HCC) 04/16/2021   ESRD on dialysis (HCC) 04/16/2021   Hypertensive urgency 03/10/2021   Hilar enlargement    Anemia of chronic disease    Chronic diastolic CHF (congestive heart failure) (HCC) 03/09/2021   CKD (chronic kidney disease), stage V (HCC) 03/08/2021   Diabetic retinopathy (HCC) 03/08/2021   Hyperglycemia due to type 2 diabetes mellitus (HCC) 03/08/2021   Pure hypercholesterolemia 03/08/2021   Anemia in chronic kidney disease 09/24/2020   Uncontrolled type 2 diabetes mellitus with hyperglycemia (HCC) 03/19/2019   Osteopenia 09/04/2018    Migraine 09/04/2018   Asthma 09/04/2018   Pseudophakia of both eyes 07/17/2018   Pseudophakia of left eye 07/03/2018   Open angle with borderline findings and high glaucoma risk in left eye 07/03/2018   Age-related nuclear cataract of right eye 07/03/2018   CAD (coronary artery disease), native coronary artery 04/27/2017   Abnormal nuclear stress test 04/04/2017   Shortness of breath 03/09/2017   Appendicitis    Age-related hypermature cataract of both eyes 12/20/2016   Uterine leiomyoma 11/27/2012   Dyslipidemia (high LDL; low HDL) 11/25/2011  Obesity (BMI 30-39.9) 11/25/2011   Hypertensive disorder 11/21/2011   Type 2 diabetes mellitus (HCC) 11/21/2011   Abnormal EKG 11/21/2011    ONSET DATE: 12/29/2022 prosthesis delivery  REFERRING DIAG: Q25.956L (ICD-10-CM) - Below-knee amputation of right lower extremity   THERAPY DIAG:  Unsteadiness on feet  Other abnormalities of gait and mobility  Muscle weakness (generalized)  Hemiplegia and hemiparesis following cerebral infarction affecting left non-dominant side (HCC)  Chronic pain of both knees  Rationale for Evaluation and Treatment: Rehabilitation  SUBJECTIVE:   SUBJECTIVE STATEMENT: She continues to relay back pain with prolonged standing and walking  PERTINENT HISTORY: CVA / left hemiparesis due to right basal ganglia infarct 7/22, right hip bursitis, anxiety, dCHF, ESRD on HD, HTN, DM, CAD  PAIN:  Are you having pain?  Yes: NPRS scale:   complaints of pain with standing but does not rate Pain location: Rt knee, back Pain description: throbbing Aggravating factors: standing, walking Relieving factors: Voltarin & massage  PRECAUTIONS: Fall and Other: NO BP RUE  WEIGHT BEARING RESTRICTIONS: No  FALLS: Has patient fallen in last 6 months? No  LIVING ENVIRONMENT: Lives with: lives with their spouse and CNAs 24 hrs 7 days / wk Lives in: House Home Access: Ramped entrance Home layout: Multi level, Full bath on  main level, and Able to live on main level with bedroom and bathroom Stairs: Yes: Internal: 16 steps; on left going up and External: 4 steps; on right going up Has following equipment at home: Single point cane, Walker - 2 wheeled, Wheelchair (manual), and shower chair  PLOF: Independent with household mobility with device and Independent with community mobility with device  PATIENT GOALS: walk with prosthesis, stand & transfer.   OBJECTIVE:  COGNITION: Overall cognitive status: History of cognitive impairments - at baseline  POSTURE: rounded shoulders, forward head, decreased lumbar lordosis, increased thoracic kyphosis, and flexed trunk   LOWER EXTREMITY ROM: Eval / 01/05/2023:    BLEs PROM WFL except unable to assess hip ext  (Blank rows = not tested)  LOWER EXTREMITY MMT:  MMT Right eval Right 03/06/23  Hip flexion 3-/5 3-/5  Hip extension 3-/5 3-/5  Hip abduction 3-/5 3-/5  Hip adduction    Hip internal rotation    Hip external rotation    Knee flexion 3-/5 3-/5  Knee extension 3-/5 3-/5  Ankle dorsiflexion NA NA  Ankle plantarflexion NA NA  Ankle inversion NA NA  Ankle eversion NA NA  03/06/2023: LLE hemiparesis but able to advance during gait without assistance.   Eval / 01/05/2023: left LE & UE hemiparesis.  In gait slides to advance LLE and knee / hip instability in stance.  (Blank rows = not tested)  TRANSFERS: 05/09/23 Sit to stand w/c & 18" chair with armrests to RW with modA. Stand to sit with minA. Stand-pivot  transfer with RW with modA / constant cueing. Stand-pivot transfer holding onto PT mod A  03/06/2023:  Sit to stand w/c & 18" chair with armrests to RW with modA. Stand to sit with minA. Stand-pivot  transfer with RW with modA / constant cueing.  02/07/2023: Sit to stand w/c or 18" chair with armrests to RW with modA. Stand to sit with minA. Both require constant cues. Stand-pivot transfer with RW with modA / constant cueing. (2nd person for  safety)   01/05/2023 / Eval: Sit to stand: Max A pulling BUEs on //bars from w/c Stand to sit: Min A using BUEs //bars to control descent Lateral scoot transfers:  Mod A w/c to / from mat table level & adjacent   GAIT: 03/06/2023: Pt amb 5' with RW with +2 modA including 90* turn to chair.  02/07/2023: Pt amb 5' with RW with +2 modA including 90* turn to chair.   01/05/2023 / Eval: Gait pattern: step to pattern, decreased step length- Right, decreased step length- Left, decreased stance time- Right, decreased stance time- Left, decreased hip/knee flexion- Left, decreased ankle dorsiflexion- Left, Right hip hike, Left hip hike, knee flexed in stance- Left, antalgic, lateral hip instability, trunk flexed, wide BOS, and poor foot clearance- Left Distance walked: 3' Assistive device utilized: // bars & RLE TTA prosthesis Level of assistance: Total A / 2 person Comments:  LLE with poor clearance / slides foot forward and knee / hip instability in stance.   FUNCTIONAL TESTs:  03/06/2023: static stance with RW support 60 sec with minA.  Eval / 01/05/2023:  Standing in //bars for 30 sec with modA.    CURRENT PROSTHETIC WEAR ASSESSMENT: 03/06/2023: Patient's husband & CNA correctly don & doff her prosthesis. Pt is dependent requiring maxA to don or doff.  She reports wearing prosthesis 5 hrs 2x/day for 10 hours total on non-dialysis days and up to 8 hours on dialysis days.  Her limb has scab on lateral incision that appears to be internal suture working way out. No signs of infection. Her residual limb appearance is dry skin, normal temperature, limited to no hair growth, cylindrical shape.   Eval / 01/05/2023:  Patient is dependent with: skin check, residual limb care, care of non-amputated limb, prosthetic cleaning, ply sock cleaning, correct ply sock adjustment, proper wear schedule/adjustment, and proper weight-bearing schedule/adjustment Donning prosthesis: Total A / family & CNA requires 100%  cueing Doffing prosthesis: Total A / family & CNA requires 100% cueing Prosthetic wear tolerance: 1-1.5 hours, 1x/day, 6 of 7 days since delivery Prosthetic weight bearing tolerance: 3 minutes during eval with c/o knee pain Edema: pitting Residual limb condition: lateral incision with 3mm X 6mm long scabs with serous drainage, arrived with bandaid over wound,  dry skin, normal temperature & color, cylindrical shape Prosthetic description: silicon liner with pin lock suspension, total contact socket, SACH foot K code/activity level with prosthetic use: Level 1    TODAY'S TREATMENT:                                                                                                                             DATE:  05/23/2023: Prosthetic Training with TTA prosthesis Sit to stand with RW and patient pushing off wheelchair with min to mod assist, requires cues for set up, hand placement, and sequence, X 5 reps total Patient ambulated 43 feet max tolerated distance (Spo2 99% and HR 78 after), 31 feet, and 25 feet all with rolling walker and min A with 90 deg turn to Rt and left to sit in chair Standing at sink with one UE support and reaching up with Rt  arm to place small bottle of water on/off of shelf X 10 then left arm raising arm only as high as she can X 5 reps.   Therapeutic Exercise Leg press DL 16# 1W96, then single leg 31# X10 reps 2 sets each side with manual assistance at left knee to reduce knee varus/hip rotation Nu step L4 X 7 min UE/LE for functional endurance  05/18/2023: Prosthetic Training with TTA prosthesis Sit to stand with RW and patient pushing off wheelchair with min to mod assist, requires cues for set up, hand placement, and sequence, X 5 reps total Patient ambulated 32 feet, 20 feet, and 25 feet all with rolling walker and min A with 90 deg turn to Rt and left to sit in chair Standing at sink with one UE support and reaching up with Rt arm to place small bottle of water  on/off of shelf X 5 then left arm raising arm only as high as she can X 5 reps. One seated rest break then performed one more round of this.    Therapeutic Exercise Leg press DL 04# 5W09, then single leg 31# X10 reps 2 sets each side with manual assistance at left knee to reduce knee varus/hip rotation Nu step L4 X 7 min UE/LE for functional endurance  05/16/2023: Prosthetic Training with TTA prosthesis Sit to stand with RW and patient pushing off wheelchair with min to mod assist, requires cues for set up, hand placement, and sequence, X 5 reps total Patient ambulated 23 feet X 2 with rolling walker and min A with 90 deg turn to Rt and left to sit in chair, then another 15 feet X 1 with 90 deg turn, and another 5 feet X 1 with 180 deg turn to chair one rep Measured SPO2 at 98% after ambulation as she did have some short of breath associated with this.   Therapeutic Exercise Leg press DL 81# 1B14, then single leg 31# X10 reps 2 sets each side with manual assistance at left knee to reduce knee varus/hip rotation Nu step L4 X 6 min UE/LE for functional endurance PROM hamstring stretch BLEs 30 sec hold 2 reps ea LE during leg press rests   HOME EXERCISE PROGRAM:  ASSESSMENT:  CLINICAL IMPRESSION: She improved her max tolerated ambulation distance from 32 feet to 43 feet today and overall was able to ambulate 99 total feet today in her session spread out in 3 trips with therex in between. Her HR and SP02 levels were also good with this increased activity level. She will continue to benefit from skilled PT and will need to set up additional visits as she only has one more left scheduled.   OBJECTIVE IMPAIRMENTS: Abnormal gait, decreased activity tolerance, decreased balance, decreased cognition, decreased coordination, decreased endurance, decreased knowledge of condition, decreased knowledge of use of DME, decreased mobility, difficulty walking, decreased ROM, decreased strength, postural  dysfunction, prosthetic dependency , obesity, and pain.   ACTIVITY LIMITATIONS: standing, stairs, transfers, locomotion level, and safe prosthesis use.  PARTICIPATION LIMITATIONS: community activity, church, and household mobility  PERSONAL FACTORS: Age, Fitness, Past/current experiences, Time since onset of injury/illness/exacerbation, and 3+ comorbidities: see PMH  are also affecting patient's functional outcome.   REHAB POTENTIAL: Good  CLINICAL DECISION MAKING: Evolving/moderate complexity  EVALUATION COMPLEXITY: Moderate   GOALS: Goals reviewed with patient? Yes  SHORT TERM GOALS: Target date: 04/06/2023  Patient reports how to correctly adjust dialysis weight and and weigh out to include prosthesis. Baseline: SEE OBJECTIVE DATA Goal status:  Met 04/04/2023 2.  Patient tolerates prosthesis >90% of awake hrs/day without increase in skin issues or limb / knee pain <5/10 with standing. Baseline: SEE OBJECTIVE DATA Goal status:  Met 04/04/2023  3.  Patient stand pivot transfer with RW with minA. Baseline: SEE OBJECTIVE DATA Goal status: partially MET 04/04/2023  4. Patient ambulates 10' including turning 90* to position to sit with RW with modA of one person.  Baseline: SEE OBJECTIVE DATA Goal status: Met 04/04/2023    LONG TERM GOALS: Target date: 06/01/2023  Patient & family / CNA demonstrates & verbalized understanding of prosthetic care to enable safe utilization of prosthesis. Baseline: SEE OBJECTIVE DATA Goal status: ongoing 05/09/2023  Patient tolerates prosthesis wear >90% of awake hours without skin or limb pain issues and knee pain </= 4/10.  Baseline: SEE OBJECTIVE DATA Goal status:  MET 05/09/23 She reports she wears it all day now except for about 15 minutes she takes it off due to becoming too hot.   Stand balance with walker: static 2 min with supervision to enable ability for standing ADLS. Baseline: SEE OBJECTIVE DATA Goal status: MET 05/09/2023  Patient  ambulates >50' with RW & prosthesis only with family / CNA assist safely.  Baseline: SEE OBJECTIVE DATA Goal status: ongoing 05/02/2023  Patient transfers stand pivot including sit to/from stand with RW with minA or less.  Baseline: SEE OBJECTIVE DATA Goal status:  ongoing 05/09/2023, mod A overall.    PLAN:  PT FREQUENCY: 2x/week  PT DURATION: 13 weeks / 90 days  PLANNED INTERVENTIONS: Therapeutic exercises, Therapeutic activity, Neuromuscular re-education, Balance training, Gait training, Patient/Family education, Self Care, Vestibular training, Prosthetic training, DME instructions, and Manual therapy  PLAN FOR NEXT SESSION:  Work on patient walking between 2 chairs and decreasing need for wheelchair to be followed behind patient.  Continue gait and transfers including stand pivot with rolling walker, leg strength, balance  Referring diagnosis? Z61.096E (ICD-10-CM) - Below-knee amputation of right lower extremity  Treatment diagnosis? (if different than referring diagnosis)  Unsteadiness on feet R26.81   Other abnormalities of gait and mobility R26.89   Muscle weakness (generalized) M62.81   Hemiplegia and hemiparesis following cerebral infarction affecting left non-dominant side (HCC) I69.354   Non-pressure chronic ulcer of skin of other sites with other specified severity (HCC) L98.498   Chronic pain of both knees M25.561, M25.562, G89.29   What was this (referring dx) caused by? [x]  Surgery []  Fall []  Ongoing issue []  Arthritis []  Other: ____________   Laterality: [x]  Rt []  Lt []  Both   Check all possible CPT codes:                   *CHOOSE 10 OR LESS*                                [x]  97110 (Therapeutic Exercise)                   []  92507 (SLP Treatment)      [x]  [id number] (Neuro Re-ed)                                []  92526 (Swallowing Treatment)                  [x]  45409 (Gait Training)                                []   82956 (Cognitive Training, 1st 15  minutes) []  97140 (Manual Therapy)                           []  97130 (Cognitive Training, each add'l 15 minutes)         [x]  97164 (Re-evaluation)                              []  Other, List CPT Code ____________  [x]  97530 (Therapeutic Activities)                                            [x]  21308 (Self Care)               []  All codes above (97110 - 97535)             []  97012 (Mechanical Traction)             []  97014 (E-stim Unattended)             []  97032 (E-stim manual)             []  97033 (Ionto)             []  97035 (Ultrasound) []  97750 (Physical Performance Training) []  [id number] (Aquatic Therapy) []  97016 (Vasopneumatic Device) []  [id number] (Paraffin) []  97034 (Contrast Bath) []  97597 (Wound Care 1st 20 sq cm) []  97598 (Wound Care each add'l 20 sq cm) []  97760 (Orthotic Fabrication, Fitting, Training Initial) [x]  [id number] (Prosthetic Management and Training Initial) []  [id number] (Orthotic or Prosthetic Training/ Modification Subsequent)  April Manson, PT, DPT 05/23/2023, 4:40 PM

## 2023-05-24 ENCOUNTER — Ambulatory Visit (HOSPITAL_COMMUNITY)
Admission: RE | Admit: 2023-05-24 | Discharge: 2023-05-24 | Disposition: A | Discharge status: Home / Self Care | Payer: Medicare PPO | Source: Ambulatory Visit | Attending: Vascular Surgery | Admitting: Vascular Surgery

## 2023-05-24 ENCOUNTER — Ambulatory Visit: Payer: Medicare PPO | Admitting: Physician Assistant

## 2023-05-24 VITALS — BP 161/64 | HR 69 | Temp 98.2°F | Resp 20

## 2023-05-24 DIAGNOSIS — I739 Peripheral vascular disease, unspecified: Secondary | ICD-10-CM

## 2023-05-24 DIAGNOSIS — I70222 Atherosclerosis of native arteries of extremities with rest pain, left leg: Secondary | ICD-10-CM | POA: Insufficient documentation

## 2023-05-24 DIAGNOSIS — N186 End stage renal disease: Secondary | ICD-10-CM

## 2023-05-24 DIAGNOSIS — Z992 Dependence on renal dialysis: Secondary | ICD-10-CM

## 2023-05-24 LAB — VAS US ABI WITH/WO TBI: Left ABI: 1.12

## 2023-05-24 NOTE — Progress Notes (Signed)
HISTORY AND PHYSICAL     CC:  follow up. Requesting Provider:  No ref. provider found  HPI: This is a 72 y.o. female who is here today for follow up for PAD.  Pt has hx of  ESRD via TDC.  She has a 1st stage right BVT that has not been transposed.   Pt was seen for ulcerations on the right foot and subsequently underwent right BKA by Dr. Lajoyce Corners.  She was seen by me in October 2023 at which time she had a small ulceration on the left lateral heel.  She also had some discoloration of the left 3rd toe and she said it had been that way since she was a kid riding bikes.  She was not having any pain in the left foot at night.    Pt was last seen 11/23/2022 and at that time, she was not having any pain in her left foot.  Her ABI had improved.  3rd toe remained darkened but improved from previous visit.  She did have some swelling that improved with elevation.  She was continuing to use her [facility] and not willing to proceed with 2nd stage BVT.  Her husband had inquired about a PD catheter, but she has hx of appendectomy and C-section and possibly not a candidate for this.   The pt returns today for follow up & here with her husband.  She states she is tired today b/c she was at dialysis earlier.  She denies any pain in her left foot.  She does not have any non healing wounds.  The left 3rd toe remains darkened and is unchanged from previous visit.  She still has no interest in 2nd stage right BVT.  She is wearing her prosthesis and is working with PT. She does have some swelling in the LLE.  Her husband states she did not complete her dialysis treatment this morning.    The pt is on a statin for cholesterol management.    The pt is on an aspirin.    Other AC:  Brilinta The pt is on BB, ARB for hypertension.  The pt is  on medication for diabetes. Tobacco hx:  never    Past Medical History:  Diagnosis Date   Acute GI bleeding    Allergy    Anemia    Anterior chest wall pain    Appendicitis 1965    Asthma    Body mass index 37.0-37.9, adult    Breast pain    Cataract    both eyes   CHF (congestive heart failure) (HCC)    Chronic kidney disease    stage 5 - on dialysis   Cognitive change 04/20/2021   r/t cva 03/2821   Complication of anesthesia    Memory loss after general   Dehydration 2014   Deviated septum 1971   Diabetes mellitus    Dysphagia due to old stroke    easy to get strangled when eating   Dyspnea 2014   Extrinsic asthma    WITH ASTHMA ATTACK   Fibroid 1980   GERD (gastroesophageal reflux disease)    Heart murmur    History of migraine    History of seizure    with stroke   Hx gestational diabetes    Hyperlipidemia    Hypertension 2014   Inguinal hernia 1959   Malaise and fatigue 2014   Non-IgE mediated allergic asthma 2014   Obesity    Pelvic pain    Pregnancy, high-risk 1985  Stroke [facility]) 04/20/2021   (CVA) of right basal ganglia   Tonsillitis 1968   Uterine fibroid 1980   Visual field defect    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    Past Surgical History:  Procedure Laterality Date   ABDOMINAL AORTOGRAM W/LOWER EXTREMITY N/A 02/21/2022   Procedure: ABDOMINAL AORTOGRAM W/LOWER EXTREMITY;  Surgeon: Maeola Harman, MD;  Location: [facility] INVASIVE CV LAB;  Service: Cardiovascular;  Laterality: N/A;   AMPUTATION Right 05/18/2022   Procedure: RIGHT BELOW KNEE AMPUTATION;  Surgeon: Nadara Mustard, MD;  Location: [facility] OR;  Service: Orthopedics;  Laterality: Right;   AMPUTATION TOE Right 04/15/2022   Procedure: AMPUTATION  LST TOERIGHT FOOT;  Surgeon: Edwin Cap, DPM;  Location: [facility] ORS;  Service: Podiatry;  Laterality: Right;   APPENDECTOMY  1959   BASCILIC VEIN TRANSPOSITION Right 04/30/2021   Procedure: RIGHT FIRST STAGE BASCILIC VEIN TRANSPOSITION;  Surgeon: Chuck Hint, MD;  Location: [facility] OR;  Service: Vascular;  Laterality: Right;   CESAREAN SECTION  1985   COLONOSCOPY     ESOPHAGOGASTRODUODENOSCOPY (EGD) WITH PROPOFOL N/A 04/18/2021    Procedure: ESOPHAGOGASTRODUODENOSCOPY (EGD) WITH PROPOFOL;  Surgeon: Iva Boop, MD;  Location: [facility] ENDOSCOPY;  Service: Endoscopy;  Laterality: N/A;   EYE SURGERY     bilateral cataract    FLEXIBLE SIGMOIDOSCOPY N/A 04/18/2021   Procedure: FLEXIBLE SIGMOIDOSCOPY;  Surgeon: Iva Boop, MD;  Location: [facility] ENDOSCOPY;  Service: Endoscopy;  Laterality: N/A;   HERNIA REPAIR  1959   IR FLUORO GUIDE CV LINE RIGHT  03/18/2021   IR US GUIDE VASC ACCESS RIGHT  03/18/2021   LEFT HEART CATH AND CORONARY ANGIOGRAPHY N/A 04/04/2017   Procedure: Left Heart Cath and Coronary Angiography;  Surgeon: Lyn Records, MD;  Location: [facility] INVASIVE CV LAB;  Service: Cardiovascular;  Laterality: N/A;   MYOMECTOMY  1980, 2004, 2007   PERIPHERAL VASCULAR THROMBECTOMY  02/21/2022   Procedure: PERIPHERAL VASCULAR THROMBECTOMY;  Surgeon: Maeola Harman, MD;  Location: [facility] INVASIVE CV LAB;  Service: Cardiovascular;;  Right Tibial   RHINOPLASTY  1971   ROTATOR CUFF REPAIR  2003   SURGICAL REPAIR OF HEMORRHAGE  2015   TONSILLECTOMY  1968    Allergies  Allergen Reactions   Food Anaphylaxis    Peanuts - anaphylaxis Almonds - anaphylaxis Not listed on MAR   Statins Itching and Other (See Comments)    Generalized aches- tolerates crestor  Not listed on MAR   Gadolinium Derivatives Other (See Comments)    Gadolinium-Containing Contrast Media Listed as an allergy on MAR Unknown reaction   Januvia [Sitagliptin] Other (See Comments)    Listed as an allergy on MAR Unknown reaction   Pork-Derived Products Other (See Comments)    Does not eat pork  Listed on MAR   Shellfish Allergy Other (See Comments)    Mouth gets raw Listed on MAR   Tetracycline Other (See Comments)    Raw mouth Not listed on MAR    Current Outpatient Medications  Medication Sig Dispense Refill   acetaminophen (TYLENOL) 500 MG tablet Take 1,000 mg by mouth 2 (two) times daily as needed (leg and arm pain).     albuterol (PROVENTIL)  (2.5 MG/3ML) 0.083% nebulizer solution Use 1 vial (2.5 mg total) by nebulization every 4 (four) hours as needed for wheezing or shortness of breath. 90 mL 0   aspirin EC 81 MG tablet Take 1 tablet (81 mg total) by mouth daily. Swallow whole. 30 tablet 12   carvedilol (  COREG) 3.125 MG tablet Take 1 tablet (3.125 mg total) by mouth 2 (two) times daily with a meal. 180 tablet 1   cinacalcet (SENSIPAR) 30 MG tablet Take 30 mg by mouth daily.     diclofenac Sodium (VOLTAREN) 1 % GEL Apply 2 g topically 4 (four) times daily. To left wrist 150 g 0   Glucagon, rDNA, (GLUCAGON EMERGENCY) 1 MG KIT Inject 1 mg into the vein as needed (CBG of 65mg /dL).     guaifenesin (ROBITUSSIN) 100 MG/5ML syrup Take 200 mg by mouth 3 (three) times daily as needed for cough.     hydrALAZINE (APRESOLINE) 25 MG tablet Take 1 tablet (25 mg total) by mouth in the morning and at bedtime. 180 tablet 3   hydrocortisone (ANUSOL-HC) 2.5 % rectal cream Place rectally 4 (four) times daily. 30 g 0   insulin aspart (NOVOLOG) 100 UNIT/ML injection Inject 0-6 Units into the skin 3 (three) times daily with meals. CBG < 70: Implement Hypoglycemia Standing Orders and refer to Hypoglycemia Standing Orders sidebar report CBG 70 - 120: 0 units CBG 121 - 150: 0 units CBG 151 - 200: 1 unit CBG 201-250: 2 units CBG 251-300: 3 units CBG 301-350: 4 units CBG 351-400: 5 units CBG > 400: Give 6 units and call MD     insulin glargine (LANTUS) 100 UNIT/ML Solostar Pen Inject 5 Units into the skin at bedtime. 15 mL    levETIRAcetam (KEPPRA) 250 MG tablet Take 1.5 tablets (375 mg total) by mouth every Monday, Wednesday, and Friday at 6 PM.     levETIRAcetam (KEPPRA) 750 MG tablet Take 1 tablet (750 mg total) by mouth at bedtime.     loratadine (CLARITIN) 10 MG tablet Take 10 mg by mouth daily.     losartan (COZAAR) 50 MG tablet Take 50 mg by mouth daily.     methylphenidate (RITALIN) 5 MG tablet Take 1 tablet (5 mg total) by mouth 2 (two) times daily  with breakfast and lunch. 60 tablet 0   mometasone-formoterol (DULERA) 200-5 MCG/ACT AERO Inhale 2 puffs into the lungs 2 (two) times daily. 3 each 2   pantoprazole (PROTONIX) 40 MG tablet Take 1 tablet (40 mg total) by mouth daily. (Patient taking differently: Take 40 mg by mouth at bedtime.) 90 tablet 2   prochlorperazine (COMPAZINE) 10 MG tablet Take 1 tablet (10 mg total) by mouth every 8 (eight) hours as needed for nausea or vomiting. 60 tablet 2   rosuvastatin (CRESTOR) 20 MG tablet Take 1 tablet (20 mg total) by mouth daily. (Patient taking differently: Take 20 mg by mouth at bedtime.) 90 tablet 2   sevelamer carbonate (RENVELA) 0.8 g PACK packet Take 0.8 g by mouth 3 (three) times daily.     ticagrelor (BRILINTA) 90 MG TABS tablet Take 1 tablet (90 mg total) by mouth 2 (two) times daily. 60 tablet 3   traZODone (DESYREL) 50 MG tablet Take 0.5 tablets (25 mg total) by mouth at bedtime as needed for sleep (sleep). 20 tablet 5   vancomycin (VANCOCIN) 125 MG capsule Take 1 capsule (125 mg total) by mouth 4 (four) times daily. 26 capsule 0   No current facility-administered medications for this visit.    Family History  Problem Relation Age of Onset   Cancer Mother        abdominal melamona   Psoriasis Mother    Alzheimer's disease Father    Diabetes Maternal Aunt    Cancer Cousin  colon    Diabetes Cousin    Colon cancer Neg Hx    Colon polyps Neg Hx    Esophageal cancer Neg Hx    Cancer - Colon Neg Hx    Liver disease Neg Hx     Social History   Socioeconomic History   Marital status: Married    Spouse name: Channing Mutters   Number of children: 1   Years of education: Not on file   Highest education level: Professional school degree (e.g., MD, DDS, DVM, JD)  Occupational History   Occupation: retired  Tobacco Use   Smoking status: Never   Smokeless tobacco: Never  Vaping Use   Vaping status: Never Used  Substance and Sexual Activity   Alcohol use: No   Drug use: No    Sexual activity: Not Currently    Birth control/protection: Post-menopausal  Other Topics Concern   Not on file  Social History Narrative   06/21/21 lives with husband Dr Chalmers Guest, caregiver- Joyce Gross   Patient reports she used to walk at [employer] but now due to Covid-19 she is staying inside.   Social Determinants of Health   Financial Resource Strain: Low Risk  (09/02/2021)   Overall Financial Resource Strain (CARDIA)    Difficulty of Paying Living Expenses: Not hard at all  Food Insecurity: No Food Insecurity (02/10/2023)   Hunger Vital Sign    Worried About Running Out of Food in the Last Year: Never true    Ran Out of Food in the Last Year: Never true  Transportation Needs: No Transportation Needs (02/09/2023)   PRAPARE - Administrator, Civil Service (Medical): No    Lack of Transportation (Non-Medical): No  Physical Activity: Inactive (09/02/2021)   Exercise Vital Sign    Days of Exercise per Week: 0 days    Minutes of Exercise per Session: 0 min  Stress: Stress Concern Present (09/02/2021)   [employer] of Occupational Health - Occupational Stress Questionnaire    Feeling of Stress : To some extent  Social Connections: Socially Integrated (01/09/2019)   Social Connection and Isolation Panel [NHANES]    Frequency of Communication with Friends and Family: More than three times a week    Frequency of Social Gatherings with Friends and Family: More than three times a week    Attends Religious Services: More than 4 times per year    Active Member of [employer] or Organizations: Yes    Attends Engineer, structural: More than 4 times per year    Marital Status: Married  Catering manager Violence: Not At Risk (02/10/2023)   Humiliation, Afraid, Rape, and Kick questionnaire    Fear of Current or Ex-Partner: No    Emotionally Abused: No    Physically Abused: No    Sexually Abused: No     REVIEW OF SYSTEMS:   [X]  denotes positive finding, [ ]  denotes negative  finding Cardiac  Comments:  Chest pain or chest pressure:    Shortness of breath upon exertion:    Short of breath when lying flat:    Irregular heart rhythm:        Vascular    Pain in calf, thigh, or hip brought on by ambulation:    Pain in feet at night that wakes you up from your sleep:     Blood clot in your veins:    Leg swelling:         Pulmonary    Oxygen at home:  Productive cough:     Wheezing:         Neurologic    Sudden weakness in arms or legs:     Sudden numbness in arms or legs:     Sudden onset of difficulty speaking or slurred speech:    Temporary loss of vision in one eye:     Problems with dizziness:         Gastrointestinal    Blood in stool:     Vomited blood:         Genitourinary    Burning when urinating:     Blood in urine:        Psychiatric    Major depression:         Hematologic    Bleeding problems:    Problems with blood clotting too easily:        Skin    Rashes or ulcers:        Constitutional    Fever or chills:      PHYSICAL EXAMINATION:  Today's Vitals   05/24/23 1341  BP: (!) 161/64  Pulse: 69  Resp: 20  Temp: 98.2 F (36.8 C)  SpO2: 94%   There is no height or weight on file to calculate BMI.   General:  WDWN in NAD; vital signs documented above Gait: Not observed HENT: WNL, normocephalic Pulmonary: normal non-labored breathing , without wheezing Cardiac: regular HR, without carotid bruits Skin: without rashes Vascular Exam/Pulses:  Right Left  Radial 2+ (normal) 2+ (normal)  DP BKA 2+ (normal)  PT BKA Unable to palpate   Extremities: without ischemic changes, without Gangrene , without cellulitis; without open wounds; darkened left 3rd toe (unchanged); right arm fistula with +thrill /bruit Musculoskeletal: no muscle wasting or atrophy  Neurologic: A&O X 3 Psychiatric:  The pt has Normal affect.   Non-Invasive Vascular Imaging:   ABI's/TBI's on 05/24/2023: Right:  BKA Left:  1.12/0.41- Great toe  pressure:  68  Previous ABI's/TBI's on 11/23/2022: Right:  BKA Left:  Roe/0.51 - Great toe pressure: 81    ASSESSMENT/PLAN:: 72 y.o. female here for follow up for PAD with hx of ESRD via TDC.  She has a 1st stage right BVT that has not been transposed.   Pt was seen for ulcerations on the right foot and subsequently underwent right BKA by Dr. Lajoyce Corners.    -pt ABI today on the right in normal with multiphasic waveforms and she has a palpable left DP pulse.  She does not have any rest pain or non healing wounds.   -discussed protecting her foot  -continue asa/statin/brilinta -pt will f/u in 9 months with ABI.  She and her husband know to call sooner if she has any issues before then.  -she has a 1st stage right BVT-she has no interest in 2nd stage transposition. -discussed continuing good glucose, blood pressure control as well as cholesterol.  -mild LLE swelling-discussed a mild compression sock would be fine to wear for her edema.  Doreatha Massed, [facility] [phone]  Clinic MD:   Edilia Bo

## 2023-05-25 ENCOUNTER — Encounter: Payer: Self-pay | Admitting: Physical Therapy

## 2023-05-25 ENCOUNTER — Ambulatory Visit: Payer: Medicare PPO | Admitting: Physical Therapy

## 2023-05-25 DIAGNOSIS — G8929 Other chronic pain: Secondary | ICD-10-CM

## 2023-05-25 DIAGNOSIS — M6281 Muscle weakness (generalized): Secondary | ICD-10-CM | POA: Diagnosis not present

## 2023-05-25 DIAGNOSIS — I69354 Hemiplegia and hemiparesis following cerebral infarction affecting left non-dominant side: Secondary | ICD-10-CM

## 2023-05-25 DIAGNOSIS — R2681 Unsteadiness on feet: Secondary | ICD-10-CM

## 2023-05-25 DIAGNOSIS — R2689 Other abnormalities of gait and mobility: Secondary | ICD-10-CM

## 2023-05-25 DIAGNOSIS — M25561 Pain in right knee: Secondary | ICD-10-CM

## 2023-05-25 DIAGNOSIS — M25562 Pain in left knee: Secondary | ICD-10-CM

## 2023-05-25 NOTE — Therapy (Signed)
OUTPATIENT PHYSICAL THERAPY TREATMENT    Patient Name: Carolyn Russo MRN: [id number] DOB:1951-09-15, 72 y.o., female Today's Date: 05/25/2023  PCP: No PCP on file REFERRING PROVIDER: Aldean Baker, MD     END OF SESSION:  PT End of Session - 05/25/23 1641     Visit Number 25    Number of Visits 36    Date for PT Re-Evaluation 06/01/23    Authorization Type [employer] PPO    Authorization Time Period 12 more visits approaved 7/18 to 8/8    Authorization - Number of Visits 12    Progress Note Due on Visit 30    PT Start Time 1615    PT Stop Time 1645    PT Time Calculation (min) 30 min    Equipment Utilized During Treatment Gait belt    Activity Tolerance Patient limited by fatigue    Behavior During Therapy WFL for tasks assessed/performed                 Past Medical History:  Diagnosis Date   Acute GI bleeding    Allergy    Anemia    Anterior chest wall pain    Appendicitis 1965   Asthma    Body mass index 37.0-37.9, adult    Breast pain    Cataract    both eyes   CHF (congestive heart failure) (HCC)    Chronic kidney disease    stage 5 - on dialysis   Cognitive change 04/20/2021   r/t cva 03/2821   Complication of anesthesia    Memory loss after general   Dehydration 2014   Deviated septum 1971   Diabetes mellitus    Dysphagia due to old stroke    easy to get strangled when eating   Dyspnea 2014   Extrinsic asthma    WITH ASTHMA ATTACK   Fibroid 1980   GERD (gastroesophageal reflux disease)    Heart murmur    History of migraine    History of seizure    with stroke   Hx gestational diabetes    Hyperlipidemia    Hypertension 2014   Inguinal hernia 1959   Malaise and fatigue 2014   Non-IgE mediated allergic asthma 2014   Obesity    Pelvic pain    Pregnancy, high-risk 1985   Stroke (HCC) 04/20/2021   (CVA) of right basal ganglia   Tonsillitis 1968   Uterine fibroid 1980   Visual field defect    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    Past Surgical History:  Procedure Laterality Date   ABDOMINAL AORTOGRAM W/LOWER EXTREMITY N/A 02/21/2022   Procedure: ABDOMINAL AORTOGRAM W/LOWER EXTREMITY;  Surgeon: Maeola Harman, MD;  Location: [facility] INVASIVE CV LAB;  Service: Cardiovascular;  Laterality: N/A;   AMPUTATION Right 05/18/2022   Procedure: RIGHT BELOW KNEE AMPUTATION;  Surgeon: Nadara Mustard, MD;  Location: [facility] OR;  Service: Orthopedics;  Laterality: Right;   AMPUTATION TOE Right 04/15/2022   Procedure: AMPUTATION  LST TOERIGHT FOOT;  Surgeon: Edwin Cap, DPM;  Location: [facility] ORS;  Service: Podiatry;  Laterality: Right;   APPENDECTOMY  1959   BASCILIC VEIN TRANSPOSITION Right 04/30/2021   Procedure: RIGHT FIRST STAGE BASCILIC VEIN TRANSPOSITION;  Surgeon: Chuck Hint, MD;  Location: [facility] OR;  Service: Vascular;  Laterality: Right;   CESAREAN SECTION  1985   COLONOSCOPY     ESOPHAGOGASTRODUODENOSCOPY (EGD) WITH PROPOFOL N/A 04/18/2021   Procedure: ESOPHAGOGASTRODUODENOSCOPY (EGD) WITH PROPOFOL;  Surgeon: Iva Boop,  MD;  Location: [facility] ENDOSCOPY;  Service: Endoscopy;  Laterality: N/A;   EYE SURGERY     bilateral cataract    FLEXIBLE SIGMOIDOSCOPY N/A 04/18/2021   Procedure: FLEXIBLE SIGMOIDOSCOPY;  Surgeon: Iva Boop, MD;  Location: [facility] ENDOSCOPY;  Service: Endoscopy;  Laterality: N/A;   HERNIA REPAIR  1959   IR FLUORO GUIDE CV LINE RIGHT  03/18/2021   IR US GUIDE VASC ACCESS RIGHT  03/18/2021   LEFT HEART CATH AND CORONARY ANGIOGRAPHY N/A 04/04/2017   Procedure: Left Heart Cath and Coronary Angiography;  Surgeon: Lyn Records, MD;  Location: [facility] INVASIVE CV LAB;  Service: Cardiovascular;  Laterality: N/A;   MYOMECTOMY  1980, 2004, 2007   PERIPHERAL VASCULAR THROMBECTOMY  02/21/2022   Procedure: PERIPHERAL VASCULAR THROMBECTOMY;  Surgeon: Maeola Harman, MD;  Location: [facility] INVASIVE CV LAB;  Service: Cardiovascular;;  Right Tibial   RHINOPLASTY  1971   ROTATOR CUFF REPAIR  2003   SURGICAL  REPAIR OF HEMORRHAGE  2015   TONSILLECTOMY  1968   Patient Active Problem List   Diagnosis Date Noted   Hx of right BKA (HCC) 03/10/2023   CHF (congestive heart failure) (HCC) 02/10/2023   Dyspnea 02/09/2023   Acute respiratory distress 02/09/2023   Urinary tract infection without hematuria    Diabetes mellitus due to underlying condition with hypoglycemia without coma, with long-term current use of insulin (HCC)    Seizure disorder (HCC) 06/22/2022   Acute respiratory failure (HCC)    Unresponsiveness    Pressure ulcer 06/07/2022   Acute stroke due to ischemia (HCC) 06/03/2022   Chest pain 06/01/2022   History of CVA (cerebrovascular accident) 06/01/2022   Seizure (HCC)    Encephalopathy    Below-knee amputation of right lower extremity (HCC)    Leukocytosis    Foot infection 05/10/2022   PAD (peripheral artery disease) (HCC) 05/10/2022   Gangrene of right foot (HCC) 04/15/2022   Left hemiparesis (HCC) 09/24/2021   Abnormality of gait 09/24/2021   Pain of right hand 08/06/2021   Steal syndrome of dialysis vascular access (HCC) 08/06/2021   Subclavian steal syndrome of right subclavian artery 06/25/2021   Right hand weakness 06/25/2021   Stroke-like symptoms 05/06/2021   Bandemia    Cerebrovascular accident (CVA) of right basal ganglia (HCC) 04/20/2021   Hemorrhoids    Basal ganglia infarction (HCC) 04/16/2021   ESRD on dialysis (HCC) 04/16/2021   Hypertensive urgency 03/10/2021   Hilar enlargement    Anemia of chronic disease    Chronic diastolic CHF (congestive heart failure) (HCC) 03/09/2021   CKD (chronic kidney disease), stage V (HCC) 03/08/2021   Diabetic retinopathy (HCC) 03/08/2021   Hyperglycemia due to type 2 diabetes mellitus (HCC) 03/08/2021   Pure hypercholesterolemia 03/08/2021   Anemia in chronic kidney disease 09/24/2020   Uncontrolled type 2 diabetes mellitus with hyperglycemia (HCC) 03/19/2019   Osteopenia 09/04/2018   Migraine 09/04/2018   Asthma  09/04/2018   Pseudophakia of both eyes 07/17/2018   Pseudophakia of left eye 07/03/2018   Open angle with borderline findings and high glaucoma risk in left eye 07/03/2018   Age-related nuclear cataract of right eye 07/03/2018   CAD (coronary artery disease), native coronary artery 04/27/2017   Abnormal nuclear stress test 04/04/2017   Shortness of breath 03/09/2017   Appendicitis    Age-related hypermature cataract of both eyes 12/20/2016   Uterine leiomyoma 11/27/2012   Dyslipidemia (high LDL; low HDL) 11/25/2011   Obesity (BMI 30-39.9) 11/25/2011   Hypertensive disorder  11/21/2011   Type 2 diabetes mellitus (HCC) 11/21/2011   Abnormal EKG 11/21/2011    ONSET DATE: 12/29/2022 prosthesis delivery  REFERRING DIAG: G64.403K (ICD-10-CM) - Below-knee amputation of right lower extremity   THERAPY DIAG:  Unsteadiness on feet  Other abnormalities of gait and mobility  Muscle weakness (generalized)  Hemiplegia and hemiparesis following cerebral infarction affecting left non-dominant side (HCC)  Chronic pain of both knees  Rationale for Evaluation and Treatment: Rehabilitation  SUBJECTIVE:   SUBJECTIVE STATEMENT: Less overall back pain complaints during standing today.   PERTINENT HISTORY: CVA / left hemiparesis due to right basal ganglia infarct 7/22, right hip bursitis, anxiety, dCHF, ESRD on HD, HTN, DM, CAD  PAIN:  Are you having pain?  Yes: NPRS scale:   complaints of pain with standing but does not rate Pain location: Rt knee, back Pain description: throbbing Aggravating factors: standing, walking Relieving factors: Voltarin & massage  PRECAUTIONS: Fall and Other: NO BP RUE  WEIGHT BEARING RESTRICTIONS: No  FALLS: Has patient fallen in last 6 months? No  LIVING ENVIRONMENT: Lives with: lives with their spouse and CNAs 24 hrs 7 days / wk Lives in: House Home Access: Ramped entrance Home layout: Multi level, Full bath on main level, and Able to live on main level  with bedroom and bathroom Stairs: Yes: Internal: 16 steps; on left going up and External: 4 steps; on right going up Has following equipment at home: Single point cane, Walker - 2 wheeled, Wheelchair (manual), and shower chair  PLOF: Independent with household mobility with device and Independent with community mobility with device  PATIENT GOALS: walk with prosthesis, stand & transfer.   OBJECTIVE:  COGNITION: Overall cognitive status: History of cognitive impairments - at baseline  POSTURE: rounded shoulders, forward head, decreased lumbar lordosis, increased thoracic kyphosis, and flexed trunk   LOWER EXTREMITY ROM: Eval / 01/05/2023:    BLEs PROM WFL except unable to assess hip ext  (Blank rows = not tested)  LOWER EXTREMITY MMT:  MMT Right eval Right 03/06/23  Hip flexion 3-/5 3-/5  Hip extension 3-/5 3-/5  Hip abduction 3-/5 3-/5  Hip adduction    Hip internal rotation    Hip external rotation    Knee flexion 3-/5 3-/5  Knee extension 3-/5 3-/5  Ankle dorsiflexion NA NA  Ankle plantarflexion NA NA  Ankle inversion NA NA  Ankle eversion NA NA  03/06/2023: LLE hemiparesis but able to advance during gait without assistance.   Eval / 01/05/2023: left LE & UE hemiparesis.  In gait slides to advance LLE and knee / hip instability in stance.  (Blank rows = not tested)  TRANSFERS: 05/09/23 Sit to stand w/c & 18" chair with armrests to RW with modA. Stand to sit with minA. Stand-pivot  transfer with RW with modA / constant cueing. Stand-pivot transfer holding onto PT mod A  03/06/2023:  Sit to stand w/c & 18" chair with armrests to RW with modA. Stand to sit with minA. Stand-pivot  transfer with RW with modA / constant cueing.  02/07/2023: Sit to stand w/c or 18" chair with armrests to RW with modA. Stand to sit with minA. Both require constant cues. Stand-pivot transfer with RW with modA / constant cueing. (2nd person for safety)   01/05/2023 / Eval: Sit to stand: Max A  pulling BUEs on //bars from w/c Stand to sit: Min A using BUEs //bars to control descent Lateral scoot transfers: Mod A w/c to / from mat table level &  adjacent   GAIT: 03/06/2023: Pt amb 5' with RW with +2 modA including 90* turn to chair.  02/07/2023: Pt amb 5' with RW with +2 modA including 90* turn to chair.   01/05/2023 / Eval: Gait pattern: step to pattern, decreased step length- Right, decreased step length- Left, decreased stance time- Right, decreased stance time- Left, decreased hip/knee flexion- Left, decreased ankle dorsiflexion- Left, Right hip hike, Left hip hike, knee flexed in stance- Left, antalgic, lateral hip instability, trunk flexed, wide BOS, and poor foot clearance- Left Distance walked: 3' Assistive device utilized: // bars & RLE TTA prosthesis Level of assistance: Total A / 2 person Comments:  LLE with poor clearance / slides foot forward and knee / hip instability in stance.   FUNCTIONAL TESTs:  03/06/2023: static stance with RW support 60 sec with minA.  Eval / 01/05/2023:  Standing in //bars for 30 sec with modA.    CURRENT PROSTHETIC WEAR ASSESSMENT: 03/06/2023: Patient's husband & CNA correctly don & doff her prosthesis. Pt is dependent requiring maxA to don or doff.  She reports wearing prosthesis 5 hrs 2x/day for 10 hours total on non-dialysis days and up to 8 hours on dialysis days.  Her limb has scab on lateral incision that appears to be internal suture working way out. No signs of infection. Her residual limb appearance is dry skin, normal temperature, limited to no hair growth, cylindrical shape.   Eval / 01/05/2023:  Patient is dependent with: skin check, residual limb care, care of non-amputated limb, prosthetic cleaning, ply sock cleaning, correct ply sock adjustment, proper wear schedule/adjustment, and proper weight-bearing schedule/adjustment Donning prosthesis: Total A / family & CNA requires 100% cueing Doffing prosthesis: Total A / family & CNA  requires 100% cueing Prosthetic wear tolerance: 1-1.5 hours, 1x/day, 6 of 7 days since delivery Prosthetic weight bearing tolerance: 3 minutes during eval with c/o knee pain Edema: pitting Residual limb condition: lateral incision with 3mm X 6mm long scabs with serous drainage, arrived with bandaid over wound,  dry skin, normal temperature & color, cylindrical shape Prosthetic description: silicon liner with pin lock suspension, total contact socket, SACH foot K code/activity level with prosthetic use: Level 1    TODAY'S TREATMENT:                                                                                                                             DATE: 05/25/2023: Prosthetic Training with TTA prosthesis Sit to stand with RW and patient pushing off wheelchair with min to mod assist, requires cues for set up, hand placement, and sequence, X 5 reps total Patient ambulated 55 feet max tolerated distance (Spo2 99% and HR 78 after), 31 feet, 30 feet, and 40 feet all with rolling walker and min A with 90 deg turn to Rt and left to sit in chair Standing at sink with one UE support and reaching up with Rt arm to place small bottle of water on/off of  shelf X 10 then left arm raising arm only as high as she can X 5 reps.   Therapeutic Exercise Leg press DL 16# 1W96, then single leg 31# X10 reps 2 sets each side with manual assistance at left knee to reduce knee varus/hip rotation Nu step L5 X 7 min UE/LE for functional endurance  05/23/2023: Prosthetic Training with TTA prosthesis Sit to stand with RW and patient pushing off wheelchair with min to mod assist, requires cues for set up, hand placement, and sequence, X 5 reps total Patient ambulated 43 feet max tolerated distance (Spo2 99% and HR 78 after), 31 feet, and 25 feet all with rolling walker and min A with 90 deg turn to Rt and left to sit in chair Standing at sink with one UE support and reaching up with Rt arm to place small bottle of  water on/off of shelf X 10 then left arm raising arm only as high as she can X 5 reps.   Therapeutic Exercise Leg press DL 04# 5W09, then single leg 31# X10 reps 2 sets each side with manual assistance at left knee to reduce knee varus/hip rotation Nu step L4 X 7 min UE/LE for functional endurance  05/18/2023: Prosthetic Training with TTA prosthesis Sit to stand with RW and patient pushing off wheelchair with min to mod assist, requires cues for set up, hand placement, and sequence, X 5 reps total Patient ambulated 32 feet, 20 feet, and 25 feet all with rolling walker and min A with 90 deg turn to Rt and left to sit in chair Standing at sink with one UE support and reaching up with Rt arm to place small bottle of water on/off of shelf X 5 then left arm raising arm only as high as she can X 5 reps. One seated rest break then performed one more round of this.    Therapeutic Exercise Leg press DL 81# 1B14, then single leg 31# X10 reps 2 sets each side with manual assistance at left knee to reduce knee varus/hip rotation Nu step L4 X 7 min UE/LE for functional endurance  05/16/2023: Prosthetic Training with TTA prosthesis Sit to stand with RW and patient pushing off wheelchair with min to mod assist, requires cues for set up, hand placement, and sequence, X 5 reps total Patient ambulated 23 feet X 2 with rolling walker and min A with 90 deg turn to Rt and left to sit in chair, then another 15 feet X 1 with 90 deg turn, and another 5 feet X 1 with 180 deg turn to chair one rep Measured SPO2 at 98% after ambulation as she did have some short of breath associated with this.   Therapeutic Exercise Leg press DL 78# 2N56, then single leg 31# X10 reps 2 sets each side with manual assistance at left knee to reduce knee varus/hip rotation Nu step L4 X 6 min UE/LE for functional endurance PROM hamstring stretch BLEs 30 sec hold 2 reps ea LE during leg press rests   HOME EXERCISE  PROGRAM:  ASSESSMENT:  CLINICAL IMPRESSION: Shorter session today as she arrived late but despite this she improved her max tolerated distance ambulated in one episode to 55 feet today as well as max distance walked total all with less rest breaks and SP02 levels 99%. We will continue to work to improve her ambulation and standing tolerance to improve function.   OBJECTIVE IMPAIRMENTS: Abnormal gait, decreased activity tolerance, decreased balance, decreased cognition, decreased coordination, decreased endurance, decreased  knowledge of condition, decreased knowledge of use of DME, decreased mobility, difficulty walking, decreased ROM, decreased strength, postural dysfunction, prosthetic dependency , obesity, and pain.   ACTIVITY LIMITATIONS: standing, stairs, transfers, locomotion level, and safe prosthesis use.  PARTICIPATION LIMITATIONS: community activity, church, and household mobility  PERSONAL FACTORS: Age, Fitness, Past/current experiences, Time since onset of injury/illness/exacerbation, and 3+ comorbidities: see PMH  are also affecting patient's functional outcome.   REHAB POTENTIAL: Good  CLINICAL DECISION MAKING: Evolving/moderate complexity  EVALUATION COMPLEXITY: Moderate   GOALS: Goals reviewed with patient? Yes  SHORT TERM GOALS: Target date: 04/06/2023  Patient reports how to correctly adjust dialysis weight and and weigh out to include prosthesis. Baseline: SEE OBJECTIVE DATA Goal status: Met 04/04/2023 2.  Patient tolerates prosthesis >90% of awake hrs/day without increase in skin issues or limb / knee pain <5/10 with standing. Baseline: SEE OBJECTIVE DATA Goal status:  Met 04/04/2023  3.  Patient stand pivot transfer with RW with minA. Baseline: SEE OBJECTIVE DATA Goal status: partially MET 04/04/2023  4. Patient ambulates 10' including turning 90* to position to sit with RW with modA of one person.  Baseline: SEE OBJECTIVE DATA Goal status: Met  04/04/2023    LONG TERM GOALS: Target date: 06/01/2023  Patient & family / CNA demonstrates & verbalized understanding of prosthetic care to enable safe utilization of prosthesis. Baseline: SEE OBJECTIVE DATA Goal status: ongoing 05/09/2023  Patient tolerates prosthesis wear >90% of awake hours without skin or limb pain issues and knee pain </= 4/10.  Baseline: SEE OBJECTIVE DATA Goal status:  MET 05/09/23 She reports she wears it all day now except for about 15 minutes she takes it off due to becoming too hot.   Stand balance with walker: static 2 min with supervision to enable ability for standing ADLS. Baseline: SEE OBJECTIVE DATA Goal status: MET 05/09/2023  Patient ambulates >50' with RW & prosthesis only with family / CNA assist safely.  Baseline: SEE OBJECTIVE DATA Goal status: ongoing 05/02/2023  Patient transfers stand pivot including sit to/from stand with RW with minA or less.  Baseline: SEE OBJECTIVE DATA Goal status:  ongoing 05/09/2023, mod A overall.    PLAN:  PT FREQUENCY: 2x/week  PT DURATION: 13 weeks / 90 days  PLANNED INTERVENTIONS: Therapeutic exercises, Therapeutic activity, Neuromuscular re-education, Balance training, Gait training, Patient/Family education, Self Care, Vestibular training, Prosthetic training, DME instructions, and Manual therapy  PLAN FOR NEXT SESSION:  Work on patient walking between 2 chairs and decreasing need for wheelchair to be followed behind patient.  Continue gait and transfers including stand pivot with rolling walker, leg strength, balance  Referring diagnosis? M57.846N (ICD-10-CM) - Below-knee amputation of right lower extremity  Treatment diagnosis? (if different than referring diagnosis)  Unsteadiness on feet R26.81   Other abnormalities of gait and mobility R26.89   Muscle weakness (generalized) M62.81   Hemiplegia and hemiparesis following cerebral infarction affecting left non-dominant side (HCC) I69.354   Non-pressure  chronic ulcer of skin of other sites with other specified severity (HCC) L98.498   Chronic pain of both knees M25.561, M25.562, G89.29   What was this (referring dx) caused by? [x]  Surgery []  Fall []  Ongoing issue []  Arthritis []  Other: ____________   Laterality: [x]  Rt []  Lt []  Both   Check all possible CPT codes:                   *CHOOSE 10 OR LESS*                                [  x] 97110 (Therapeutic Exercise)                   []  92507 (SLP Treatment)      [x]  [id number] (Neuro Re-ed)                                []  92526 (Swallowing Treatment)                  [x]  97116 (Gait Training)                                []  [id number] (Cognitive Training, 1st 15 minutes) []  97140 (Manual Therapy)                           []  97130 (Cognitive Training, each add'l 15 minutes)         [x]  97164 (Re-evaluation)                              []  Other, List CPT Code ____________  [x]  97530 (Therapeutic Activities)                                            [x]  97535 (Self Care)               []  All codes above (97110 - 97535)             []  97012 (Mechanical Traction)             []  97014 (E-stim Unattended)             []  97032 (E-stim manual)             []  97033 (Ionto)             []  60454 (Ultrasound) []  97750 (Physical Performance Training) []  [id number] (Aquatic Therapy) []  97016 (Vasopneumatic Device) []  [id number] (Paraffin) []  97034 (Contrast Bath) []  97597 (Wound Care 1st 20 sq cm) []  97598 (Wound Care each add'l 20 sq cm) []  97760 (Orthotic Fabrication, Fitting, Training Initial) [x]  [id number] (Prosthetic Management and Training Initial) []  [id number] (Orthotic or Prosthetic Training/ Modification Subsequent)  April Manson, PT, DPT 05/25/2023, 4:45 PM

## 2023-05-26 ENCOUNTER — Other Ambulatory Visit: Payer: Self-pay

## 2023-05-26 DIAGNOSIS — I739 Peripheral vascular disease, unspecified: Secondary | ICD-10-CM

## 2023-06-09 ENCOUNTER — Encounter: Payer: Medicare PPO | Attending: Physical Medicine and Rehabilitation | Admitting: Physical Medicine and Rehabilitation

## 2023-06-09 ENCOUNTER — Encounter: Payer: Self-pay | Admitting: Physical Medicine and Rehabilitation

## 2023-06-09 VITALS — BP 176/85 | HR 73 | Ht 63.0 in | Wt 180.0 lb

## 2023-06-09 DIAGNOSIS — I6381 Other cerebral infarction due to occlusion or stenosis of small artery: Secondary | ICD-10-CM | POA: Diagnosis present

## 2023-06-09 DIAGNOSIS — Z89511 Acquired absence of right leg below knee: Secondary | ICD-10-CM | POA: Insufficient documentation

## 2023-06-09 DIAGNOSIS — Z993 Dependence on wheelchair: Secondary | ICD-10-CM | POA: Diagnosis present

## 2023-06-09 MED ORDER — GABAPENTIN 300 MG PO CAPS: 300.0000 mg | ORAL_CAPSULE | Freq: Every day | ORAL | 1 refills | Status: DC

## 2023-06-09 MED ORDER — METHYLPHENIDATE HCL 5 MG PO TABS: 5.0000 mg | ORAL_TABLET | Freq: Two times a day (BID) | ORAL | 0 refills | Status: DC

## 2023-06-09 NOTE — Progress Notes (Signed)
Subjective:    Patient ID: Carolyn Russo, female    DOB: 1951-08-06, 72 y.o.   MRN: [id number]  HPI  Pt is a 72 yr old female with recent hx of L hemiparesis due to R basal ganglia infarct in 7/22; R hip bursitis; anxiety; LGIB; dCHF; on HD from ESRD; HTN, BMI of 34 and DM- A1c 5.2- here for f/u on stroke and R hand weakness/numbness.  Also sedation with stroke.  She is here for f/u on Stroke and R BKA.    takes nightly- helps more when has a massage-  ied trazodone-  Sleeps 2 hours at a time- and wakes up- then back to sleep.  Not sleeping during day as much per pt.  But finally fell asleep last night- and was very deep asleep when supposed to get up.    Hasn't been to PT for 2 weeks-  Walking ~ 30 ft per pt- but it's been 2 weeks- but that's total 53 ft in 1 session.   Still doing Ritalin- got this AM- sometimes doesn't on HD days- helped her be awake more now.  Had ENT follow up this afternoon- already lost hearing aid.  Went with expensive hearing aids.    Using R BKA-  mainly for transfers.    Having phantom pain in R foot- bothering her today- gave her gabapentin today and tylenol-   Pain Inventory Average Pain 9 Pain Right Now 9 My pain is sharp  In the last 24 hours, has pain interfered with the following? General activity 9 Relation with others 9 Enjoyment of life 9 What TIME of day is your pain at its worst? varies Sleep (in general) Fair  Pain is worse with: some activites Pain improves with:  . Relief from Meds:  .  Family History  Problem Relation Age of Onset   Cancer Mother        abdominal melamona   Psoriasis Mother    Alzheimer's disease Father    Diabetes Maternal Aunt    Cancer Cousin        colon    Diabetes Cousin    Colon cancer Neg Hx    Colon polyps Neg Hx    Esophageal cancer Neg Hx    Cancer - Colon Neg Hx    Liver disease Neg Hx    Social History   Socioeconomic History   Marital status: Married    Spouse name: Channing Mutters    Number of children: 1   Years of education: Not on file   Highest education level: Professional school degree (e.g., MD, DDS, DVM, JD)  Occupational History   Occupation: retired  Tobacco Use   Smoking status: Never   Smokeless tobacco: Never  Vaping Use   Vaping status: Never Used  Substance and Sexual Activity   Alcohol use: No   Drug use: No   Sexual activity: Not Currently    Birth control/protection: Post-menopausal  Other Topics Concern   Not on file  Social History Narrative   06/21/21 lives with husband Dr Chalmers Guest, caregiver- Joyce Gross   Patient reports she used to walk at [employer] but now due to Covid-19 she is staying inside.   Social Determinants of Health   Financial Resource Strain: Low Risk  (09/02/2021)   Overall Financial Resource Strain (CARDIA)    Difficulty of Paying Living Expenses: Not hard at all  Food Insecurity: No Food Insecurity (02/10/2023)   Hunger Vital Sign    Worried About Running Out of  Food in the Last Year: Never true    Ran Out of Food in the Last Year: Never true  Transportation Needs: No Transportation Needs (02/09/2023)   PRAPARE - Administrator, Civil Service (Medical): No    Lack of Transportation (Non-Medical): No  Physical Activity: Inactive (09/02/2021)   Exercise Vital Sign    Days of Exercise per Week: 0 days    Minutes of Exercise per Session: 0 min  Stress: Stress Concern Present (09/02/2021)   [employer] of Occupational Health - Occupational Stress Questionnaire    Feeling of Stress : To some extent  Social Connections: Socially Integrated (01/09/2019)   Social Connection and Isolation Panel [NHANES]    Frequency of Communication with Friends and Family: More than three times a week    Frequency of Social Gatherings with Friends and Family: More than three times a week    Attends Religious Services: More than 4 times per year    Active Member of Clubs or Organizations: Yes    Attends Banker  Meetings: More than 4 times per year    Marital Status: Married   Past Surgical History:  Procedure Laterality Date   ABDOMINAL AORTOGRAM W/LOWER EXTREMITY N/A 02/21/2022   Procedure: ABDOMINAL AORTOGRAM W/LOWER EXTREMITY;  Surgeon: Maeola Harman, MD;  Location: [facility] INVASIVE CV LAB;  Service: Cardiovascular;  Laterality: N/A;   AMPUTATION Right 05/18/2022   Procedure: RIGHT BELOW KNEE AMPUTATION;  Surgeon: Nadara Mustard, MD;  Location: [facility] OR;  Service: Orthopedics;  Laterality: Right;   AMPUTATION TOE Right 04/15/2022   Procedure: AMPUTATION  LST TOERIGHT FOOT;  Surgeon: Edwin Cap, DPM;  Location: [facility] ORS;  Service: Podiatry;  Laterality: Right;   APPENDECTOMY  1959   BASCILIC VEIN TRANSPOSITION Right 04/30/2021   Procedure: RIGHT FIRST STAGE BASCILIC VEIN TRANSPOSITION;  Surgeon: Chuck Hint, MD;  Location: [facility] OR;  Service: Vascular;  Laterality: Right;   CESAREAN SECTION  1985   COLONOSCOPY     ESOPHAGOGASTRODUODENOSCOPY (EGD) WITH PROPOFOL N/A 04/18/2021   Procedure: ESOPHAGOGASTRODUODENOSCOPY (EGD) WITH PROPOFOL;  Surgeon: Iva Boop, MD;  Location: [facility] ENDOSCOPY;  Service: Endoscopy;  Laterality: N/A;   EYE SURGERY     bilateral cataract    FLEXIBLE SIGMOIDOSCOPY N/A 04/18/2021   Procedure: FLEXIBLE SIGMOIDOSCOPY;  Surgeon: Iva Boop, MD;  Location: [facility] ENDOSCOPY;  Service: Endoscopy;  Laterality: N/A;   HERNIA REPAIR  1959   IR FLUORO GUIDE CV LINE RIGHT  03/18/2021   IR US GUIDE VASC ACCESS RIGHT  03/18/2021   LEFT HEART CATH AND CORONARY ANGIOGRAPHY N/A 04/04/2017   Procedure: Left Heart Cath and Coronary Angiography;  Surgeon: Lyn Records, MD;  Location: [facility] INVASIVE CV LAB;  Service: Cardiovascular;  Laterality: N/A;   MYOMECTOMY  1980, 2004, 2007   PERIPHERAL VASCULAR THROMBECTOMY  02/21/2022   Procedure: PERIPHERAL VASCULAR THROMBECTOMY;  Surgeon: Maeola Harman, MD;  Location: [facility] INVASIVE CV LAB;  Service: Cardiovascular;;  Right Tibial    RHINOPLASTY  1971   ROTATOR CUFF REPAIR  2003   SURGICAL REPAIR OF HEMORRHAGE  2015   TONSILLECTOMY  1968   Past Surgical History:  Procedure Laterality Date   ABDOMINAL AORTOGRAM W/LOWER EXTREMITY N/A 02/21/2022   Procedure: ABDOMINAL AORTOGRAM W/LOWER EXTREMITY;  Surgeon: Maeola Harman, MD;  Location: [facility] INVASIVE CV LAB;  Service: Cardiovascular;  Laterality: N/A;   AMPUTATION Right 05/18/2022   Procedure: RIGHT BELOW KNEE AMPUTATION;  Surgeon: Nadara Mustard, MD;  Location: [facility] OR;  Service: Orthopedics;  Laterality: Right;   AMPUTATION TOE Right 04/15/2022   Procedure: AMPUTATION  LST TOERIGHT FOOT;  Surgeon: Edwin Cap, DPM;  Location: [facility] ORS;  Service: Podiatry;  Laterality: Right;   APPENDECTOMY  1959   BASCILIC VEIN TRANSPOSITION Right 04/30/2021   Procedure: RIGHT FIRST STAGE BASCILIC VEIN TRANSPOSITION;  Surgeon: Chuck Hint, MD;  Location: [facility] OR;  Service: Vascular;  Laterality: Right;   CESAREAN SECTION  1985   COLONOSCOPY     ESOPHAGOGASTRODUODENOSCOPY (EGD) WITH PROPOFOL N/A 04/18/2021   Procedure: ESOPHAGOGASTRODUODENOSCOPY (EGD) WITH PROPOFOL;  Surgeon: Iva Boop, MD;  Location: [facility] ENDOSCOPY;  Service: Endoscopy;  Laterality: N/A;   EYE SURGERY     bilateral cataract    FLEXIBLE SIGMOIDOSCOPY N/A 04/18/2021   Procedure: FLEXIBLE SIGMOIDOSCOPY;  Surgeon: Iva Boop, MD;  Location: [facility] ENDOSCOPY;  Service: Endoscopy;  Laterality: N/A;   HERNIA REPAIR  1959   IR FLUORO GUIDE CV LINE RIGHT  03/18/2021   IR US GUIDE VASC ACCESS RIGHT  03/18/2021   LEFT HEART CATH AND CORONARY ANGIOGRAPHY N/A 04/04/2017   Procedure: Left Heart Cath and Coronary Angiography;  Surgeon: Lyn Records, MD;  Location: [facility] INVASIVE CV LAB;  Service: Cardiovascular;  Laterality: N/A;   MYOMECTOMY  1980, 2004, 2007   PERIPHERAL VASCULAR THROMBECTOMY  02/21/2022   Procedure: PERIPHERAL VASCULAR THROMBECTOMY;  Surgeon: Maeola Harman, MD;  Location: [facility] INVASIVE CV  LAB;  Service: Cardiovascular;;  Right Tibial   RHINOPLASTY  1971   ROTATOR CUFF REPAIR  2003   SURGICAL REPAIR OF HEMORRHAGE  2015   TONSILLECTOMY  1968   Past Medical History:  Diagnosis Date   Acute GI bleeding    Allergy    Anemia    Anterior chest wall pain    Appendicitis 1965   Asthma    Body mass index 37.0-37.9, adult    Breast pain    Cataract    both eyes   CHF (congestive heart failure) (HCC)    Chronic kidney disease    stage 5 - on dialysis   Cognitive change 04/20/2021   r/t cva 03/2821   Complication of anesthesia    Memory loss after general   Dehydration 2014   Deviated septum 1971   Diabetes mellitus    Dysphagia due to old stroke    easy to get strangled when eating   Dyspnea 2014   Extrinsic asthma    WITH ASTHMA ATTACK   Fibroid 1980   GERD (gastroesophageal reflux disease)    Heart murmur    History of migraine    History of seizure    with stroke   Hx gestational diabetes    Hyperlipidemia    Hypertension 2014   Inguinal hernia 1959   Malaise and fatigue 2014   Non-IgE mediated allergic asthma 2014   Obesity    Pelvic pain    Pregnancy, high-risk 1985   Stroke (HCC) 04/20/2021   (CVA) of right basal ganglia   Tonsillitis 1968   Uterine fibroid 1980   Visual field defect    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   BP (!) 176/85   Pulse 73   Ht 5\' 3"  (1.6 m)   Wt 180 lb (81.6 kg)   SpO2 95%   BMI 31.89 kg/m   Opioid Risk Score:   Fall Risk Score:  `1  Depression screen [facility] 2/9     12/09/2022    2:48 PM 09/24/2021  1:58 PM 09/02/2021   12:26 PM 06/25/2021   11:24 AM 11/26/2020    3:13 PM 08/12/2020    3:46 PM 06/18/2019    2:03 PM  Depression screen PHQ 2/9  Decreased Interest 0 1 0 1 0 0 0  Down, Depressed, Hopeless 0 1 0 1 0 0 0  PHQ - 2 Score 0 2 0 2 0 0 0  Altered sleeping    0     Tired, decreased energy    3     Change in appetite    0     Feeling bad or failure about yourself     0     Trouble concentrating    0     Moving  slowly or fidgety/restless    0     Suicidal thoughts    0     PHQ-9 Score    5     Difficult doing work/chores    Somewhat difficult        Review of Systems  Musculoskeletal:  Positive for back pain.       Left stump pain  All other systems reviewed and are negative.      Objective:   Physical Exam  Awake, more alert, accompanied by husband, in manual w/c; NAD R BKA- rubbing R BKA residual limb C/o- phantom pain-  Wearing R BKA prosthesis-  Making some jokes [place] affect- fell asleep by end of appointment- HOH  L hand looks somewhat puffy RUE Biceps/triceps 4+/5; WE 4-/5 Grip 3+/5 and FA 3+/5 LUE- Biceps/triceps 4+/5; WE 4-/5 and Grip 4/5; cannot do FA due to curling of fingers on 4th/5th digits on L hand.  Spasticity vs dupretrynes. Can open  completely when not tested directly.       Assessment & Plan:   Pt is a 72 yr old female with recent hx of L hemiparesis due to R basal ganglia infarct in 7/22; R hip bursitis; anxiety; LGIB; dCHF; on HD from ESRD; HTN, BMI of 34 and DM- A1c 5.2- here for f/u on stroke and R hand weakness/numbness.  Also sedation with stroke.  She here for f/u on R BKA and R BG infarct   Suggest Trazodone 50 mg nightly- to help her sleep better. Should have enough to try a full tablet- give 8 hours from time if giving til you want them out of bed. Best give 1-2 hours before bedtime.   Rub tip of leg- to help phantom pain.   3.  Con't Ritalin 5 mg 2x/day # 60-  Sent in refill.    4. Con't gabapentin - 300 mg nightly- #90- 1 refill.    5.    F/U in  3 months- f/u on R BG stroke and R BKA   I spent a total of 31    minutes on total care today- >50% coordination of care- due to d/w pt about ritalin and treatment of Wakefulness; nerve pain; treatment of pain; and phantom pain and sleep.

## 2023-06-09 NOTE — Patient Instructions (Addendum)
Pt is a 72 yr old female with recent hx of L hemiparesis due to R basal ganglia infarct in 7/22; R hip bursitis; anxiety; LGIB; dCHF; on HD from ESRD; HTN, BMI of 34 and DM- A1c 5.2- here for f/u on stroke and R hand weakness/numbness.  Also sedation with stroke.  She here for f/u on R BKA and R BG infarct   Suggest Trazodone 50 mg nightly- to help her sleep better. Should have enough to try a full tablet- give 8 hours from time if giving til you want them out of bed. Best give 1-2 hours before bedtime.   Rub tip of leg- to help phantom pain.   3.  Con't Ritalin 5 mg 2x/day # 60-  Sent in refill.    4. Con't gabapentin - 300 mg nightly- #90- 1 refill.    5.    F/U in  3 months- f/u on R BG stroke and R BKA  6. Use voltaren gel for pain- get up with family as well -try to treat beforehand, not afterwards- easier to treat

## 2023-06-13 ENCOUNTER — Encounter: Payer: Self-pay | Admitting: Physical Therapy

## 2023-06-13 ENCOUNTER — Ambulatory Visit: Payer: Medicare PPO | Admitting: Physical Therapy

## 2023-06-13 DIAGNOSIS — M25561 Pain in right knee: Secondary | ICD-10-CM

## 2023-06-13 DIAGNOSIS — R2681 Unsteadiness on feet: Secondary | ICD-10-CM | POA: Diagnosis not present

## 2023-06-13 DIAGNOSIS — M25562 Pain in left knee: Secondary | ICD-10-CM

## 2023-06-13 DIAGNOSIS — G8929 Other chronic pain: Secondary | ICD-10-CM

## 2023-06-13 DIAGNOSIS — L98498 Non-pressure chronic ulcer of skin of other sites with other specified severity: Secondary | ICD-10-CM

## 2023-06-13 DIAGNOSIS — I69354 Hemiplegia and hemiparesis following cerebral infarction affecting left non-dominant side: Secondary | ICD-10-CM

## 2023-06-13 DIAGNOSIS — R2689 Other abnormalities of gait and mobility: Secondary | ICD-10-CM | POA: Diagnosis not present

## 2023-06-13 DIAGNOSIS — M6281 Muscle weakness (generalized): Secondary | ICD-10-CM | POA: Diagnosis not present

## 2023-06-13 NOTE — Therapy (Signed)
OUTPATIENT PHYSICAL THERAPY TREATMENT, PROGRESS NOTE & RECERTIFICATION    Patient Name: Carolyn Russo MRN: [id number] DOB:[birth year], 72 y.o., female Today's Date: 06/13/2023  PCP: No PCP on file REFERRING PROVIDER: Aldean Baker, MD     END OF SESSION:  PT End of Session - 06/13/23 1337     Visit Number 26    Number of Visits 46    Date for PT Re-Evaluation 09/07/23    Authorization Type [employer] PPO    Authorization Time Period --    Authorization - Visit Number 1    Authorization - Number of Visits 12    Progress Note Due on Visit 36    PT Start Time 1344    PT Stop Time 1430    PT Time Calculation (min) 46 min    Equipment Utilized During Treatment Gait belt    Activity Tolerance Patient limited by fatigue    Behavior During Therapy WFL for tasks assessed/performed                  Past Medical History:  Diagnosis Date   Acute GI bleeding    Allergy    Anemia    Anterior chest wall pain    Appendicitis 1965   Asthma    Body mass index 37.0-37.9, adult    Breast pain    Cataract    both eyes   CHF (congestive heart failure) (HCC)    Chronic kidney disease    stage 5 - on dialysis   Cognitive change 04/20/2021   r/t cva 03/2821   Complication of anesthesia    Memory loss after general   Dehydration 2014   Deviated septum 1971   Diabetes mellitus    Dysphagia due to old stroke    easy to get strangled when eating   Dyspnea 2014   Extrinsic asthma    WITH ASTHMA ATTACK   Fibroid 1980   GERD (gastroesophageal reflux disease)    Heart murmur    History of migraine    History of seizure    with stroke   Hx gestational diabetes    Hyperlipidemia    Hypertension 2014   Inguinal hernia 1959   Malaise and fatigue 2014   Non-IgE mediated allergic asthma 2014   Obesity    Pelvic pain    Pregnancy, high-risk 1985   Stroke (HCC) 04/20/2021   (CVA) of right basal ganglia   Tonsillitis 1968   Uterine fibroid 1980   Visual field  defect    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   Past Surgical History:  Procedure Laterality Date   ABDOMINAL AORTOGRAM W/LOWER EXTREMITY N/A 02/21/2022   Procedure: ABDOMINAL AORTOGRAM W/LOWER EXTREMITY;  Surgeon: Maeola Harman, MD;  Location: [facility] INVASIVE CV LAB;  Service: Cardiovascular;  Laterality: N/A;   AMPUTATION Right 05/18/2022   Procedure: RIGHT BELOW KNEE AMPUTATION;  Surgeon: Nadara Mustard, MD;  Location: [facility] OR;  Service: Orthopedics;  Laterality: Right;   AMPUTATION TOE Right 04/15/2022   Procedure: AMPUTATION  LST TOERIGHT FOOT;  Surgeon: Edwin Cap, DPM;  Location: [facility] ORS;  Service: Podiatry;  Laterality: Right;   APPENDECTOMY  1959   BASCILIC VEIN TRANSPOSITION Right 04/30/2021   Procedure: RIGHT FIRST STAGE BASCILIC VEIN TRANSPOSITION;  Surgeon: Chuck Hint, MD;  Location: [facility] OR;  Service: Vascular;  Laterality: Right;   CESAREAN SECTION  1985   COLONOSCOPY     ESOPHAGOGASTRODUODENOSCOPY (EGD) WITH PROPOFOL N/A 04/18/2021   Procedure: ESOPHAGOGASTRODUODENOSCOPY (EGD)  WITH PROPOFOL;  Surgeon: Iva Boop, MD;  Location: [facility] ENDOSCOPY;  Service: Endoscopy;  Laterality: N/A;   EYE SURGERY     bilateral cataract    FLEXIBLE SIGMOIDOSCOPY N/A 04/18/2021   Procedure: FLEXIBLE SIGMOIDOSCOPY;  Surgeon: Iva Boop, MD;  Location: [facility] ENDOSCOPY;  Service: Endoscopy;  Laterality: N/A;   HERNIA REPAIR  1959   IR FLUORO GUIDE CV LINE RIGHT  03/18/2021   IR US GUIDE VASC ACCESS RIGHT  03/18/2021   LEFT HEART CATH AND CORONARY ANGIOGRAPHY N/A 04/04/2017   Procedure: Left Heart Cath and Coronary Angiography;  Surgeon: Lyn Records, MD;  Location: [facility] INVASIVE CV LAB;  Service: Cardiovascular;  Laterality: N/A;   MYOMECTOMY  1980, 2004, 2007   PERIPHERAL VASCULAR THROMBECTOMY  02/21/2022   Procedure: PERIPHERAL VASCULAR THROMBECTOMY;  Surgeon: Maeola Harman, MD;  Location: [facility] INVASIVE CV LAB;  Service: Cardiovascular;;  Right Tibial   RHINOPLASTY  1971   ROTATOR  CUFF REPAIR  2003   SURGICAL REPAIR OF HEMORRHAGE  2015   TONSILLECTOMY  1968   Patient Active Problem List   Diagnosis Date Noted   Wheelchair dependence 06/09/2023   Hx of right BKA (HCC) 03/10/2023   CHF (congestive heart failure) (HCC) 02/10/2023   Dyspnea 02/09/2023   Acute respiratory distress 02/09/2023   Urinary tract infection without hematuria    Diabetes mellitus due to underlying condition with hypoglycemia without coma, with long-term current use of insulin (HCC)    Seizure disorder (HCC) 06/22/2022   Acute respiratory failure (HCC)    Unresponsiveness    Pressure ulcer 06/07/2022   Acute stroke due to ischemia (HCC) 06/03/2022   Chest pain 06/01/2022   History of CVA (cerebrovascular accident) 06/01/2022   Seizure (HCC)    Encephalopathy    Below-knee amputation of right lower extremity (HCC)    Leukocytosis    Foot infection 05/10/2022   PAD (peripheral artery disease) (HCC) 05/10/2022   Gangrene of right foot (HCC) 04/15/2022   Left hemiparesis (HCC) 09/24/2021   Abnormality of gait 09/24/2021   Pain of right hand 08/06/2021   Steal syndrome of dialysis vascular access (HCC) 08/06/2021   Subclavian steal syndrome of right subclavian artery 06/25/2021   Right hand weakness 06/25/2021   Stroke-like symptoms 05/06/2021   Bandemia    Cerebrovascular accident (CVA) of right basal ganglia (HCC) 04/20/2021   Hemorrhoids    Basal ganglia infarction (HCC) 04/16/2021   ESRD on dialysis (HCC) 04/16/2021   Hypertensive urgency 03/10/2021   Hilar enlargement    Anemia of chronic disease    Chronic diastolic CHF (congestive heart failure) (HCC) 03/09/2021   CKD (chronic kidney disease), stage V (HCC) 03/08/2021   Diabetic retinopathy (HCC) 03/08/2021   Hyperglycemia due to type 2 diabetes mellitus (HCC) 03/08/2021   Pure hypercholesterolemia 03/08/2021   Anemia in chronic kidney disease 09/24/2020   Uncontrolled type 2 diabetes mellitus with hyperglycemia (HCC)  03/19/2019   Osteopenia 09/04/2018   Migraine 09/04/2018   Asthma 09/04/2018   Pseudophakia of both eyes 07/17/2018   Pseudophakia of left eye 07/03/2018   Open angle with borderline findings and high glaucoma risk in left eye 07/03/2018   Age-related nuclear cataract of right eye 07/03/2018   CAD (coronary artery disease), native coronary artery 04/27/2017   Abnormal nuclear stress test 04/04/2017   Shortness of breath 03/09/2017   Appendicitis    Age-related hypermature cataract of both eyes 12/20/2016   Uterine leiomyoma 11/27/2012   Dyslipidemia (high LDL; low  HDL) 11/25/2011   Obesity (BMI 30-39.9) 11/25/2011   Hypertensive disorder 11/21/2011   Type 2 diabetes mellitus (HCC) 11/21/2011   Abnormal EKG 11/21/2011    ONSET DATE: 12/29/2022 prosthesis delivery  REFERRING DIAG: M01.027O (ICD-10-CM) - Below-knee amputation of right lower extremity   THERAPY DIAG:  Unsteadiness on feet  Other abnormalities of gait and mobility  Muscle weakness (generalized)  Hemiplegia and hemiparesis following cerebral infarction affecting left non-dominant side (HCC)  Chronic pain of both knees  Non-pressure chronic ulcer of skin of other sites with other specified severity (HCC)  Rationale for Evaluation and Treatment: Rehabilitation  SUBJECTIVE:   SUBJECTIVE STATEMENT: She has been walking from w/c outside bathroom door to toilet with husband or CNA.  She is wearing prosthesis most of awake hours except during dialysis and limb throbbing.     PERTINENT HISTORY: CVA / left hemiparesis due to right basal ganglia infarct 7/22, right hip bursitis, anxiety, dCHF, ESRD on HD, HTN, DM, CAD  PAIN:  Are you having pain?  Yes: NPRS scale: reports no pain in knee or back sitting, but transition with standing 9/10 then subsides some once up Pain location: Rt knee, back Pain description: throbbing Aggravating factors: standing, walking Relieving factors: Voltarin & massage  PRECAUTIONS: Fall  and Other: NO BP RUE  WEIGHT BEARING RESTRICTIONS: No  FALLS: Has patient fallen in last 6 months? No  LIVING ENVIRONMENT: Lives with: lives with their spouse and CNAs 24 hrs 7 days / wk Lives in: House Home Access: Ramped entrance Home layout: Multi level, Full bath on main level, and Able to live on main level with bedroom and bathroom Stairs: Yes: Internal: 16 steps; on left going up and External: 4 steps; on right going up Has following equipment at home: Single point cane, Walker - 2 wheeled, Wheelchair (manual), and shower chair  PLOF: Independent with household mobility with device and Independent with community mobility with device  PATIENT GOALS: walk with prosthesis, stand & transfer.   OBJECTIVE:  COGNITION: Overall cognitive status: History of cognitive impairments - at baseline  POSTURE: rounded shoulders, forward head, decreased lumbar lordosis, increased thoracic kyphosis, and flexed trunk   LOWER EXTREMITY ROM: Eval / 01/05/2023:    BLEs PROM WFL except unable to assess hip ext  (Blank rows = not tested)  LOWER EXTREMITY MMT:  MMT Right eval Right 03/06/23  Hip flexion 3-/5 3-/5  Hip extension 3-/5 3-/5  Hip abduction 3-/5 3-/5  Hip adduction    Hip internal rotation    Hip external rotation    Knee flexion 3-/5 3-/5  Knee extension 3-/5 3-/5  Ankle dorsiflexion NA NA  Ankle plantarflexion NA NA  Ankle inversion NA NA  Ankle eversion NA NA  03/06/2023: LLE hemiparesis but able to advance during gait without assistance.   Eval / 01/05/2023: left LE & UE hemiparesis.  In gait slides to advance LLE and knee / hip instability in stance.  (Blank rows = not tested)  TRANSFERS: 06/13/2023: Sit to stand w/c & 18" chair with armrests to RW with modA. Stand to sit with minA. Stand-pivot transfer with RW with minA once pt standing.   05/09/23 Sit to stand w/c & 18" chair with armrests to RW with modA. Stand to sit with minA. Stand-pivot  transfer with RW with  modA / constant cueing. Stand-pivot transfer holding onto PT mod A  03/06/2023:  Sit to stand w/c & 18" chair with armrests to RW with modA. Stand to sit with minA.  Stand-pivot  transfer with RW with modA / constant cueing.  02/07/2023: Sit to stand w/c or 18" chair with armrests to RW with modA. Stand to sit with minA. Both require constant cues. Stand-pivot transfer with RW with modA / constant cueing. (2nd person for safety)   01/05/2023 / Eval: Sit to stand: Max A pulling BUEs on //bars from w/c Stand to sit: Min A using BUEs //bars to control descent Lateral scoot transfers: Mod A w/c to / from mat table level & adjacent   FUNCTIONAL TESTs:  06/13/2023: PT manually cues weight shift over feet with modA initially then pt able to stand 2 min with RW support with close supervision.   03/06/2023: static stance with RW support 60 sec with minA.  Eval / 01/05/2023:  Standing in //bars for 30 sec with modA.   GAIT: 06/13/2023: Pt amb 25' with RW with minA once pt standing.  Pt required w/c following so she can sit immediately upon fatiguing.   03/06/2023: Pt amb 5' with RW with +2 modA including 90* turn to chair.  02/07/2023: Pt amb 5' with RW with +2 modA including 90* turn to chair.   01/05/2023 / Eval: Gait pattern: step to pattern, decreased step length- Right, decreased step length- Left, decreased stance time- Right, decreased stance time- Left, decreased hip/knee flexion- Left, decreased ankle dorsiflexion- Left, Right hip hike, Left hip hike, knee flexed in stance- Left, antalgic, lateral hip instability, trunk flexed, wide BOS, and poor foot clearance- Left Distance walked: 3' Assistive device utilized: // bars & RLE TTA prosthesis Level of assistance: Total A / 2 person Comments:  LLE with poor clearance / slides foot forward and knee / hip instability in stance.   CURRENT PROSTHETIC WEAR ASSESSMENT: 06/13/2023: pt & husband report pt wearing prosthesis all awake hours except  during dialysis and ~ 1 hour when her limb starts to throb.  Pt & husband report proper limb care, don/doff and cleaning prosthesis. They require intermittent PT instruction for adjusting ply socks and weight bearing.   03/06/2023: Patient's husband & CNA correctly don & doff her prosthesis. Pt is dependent requiring maxA to don or doff.  She reports wearing prosthesis 5 hrs 2x/day for 10 hours total on non-dialysis days and up to 8 hours on dialysis days.  Her limb has scab on lateral incision that appears to be internal suture working way out. No signs of infection. Her residual limb appearance is dry skin, normal temperature, limited to no hair growth, cylindrical shape.   Eval / 01/05/2023:  Patient is dependent with: skin check, residual limb care, care of non-amputated limb, prosthetic cleaning, ply sock cleaning, correct ply sock adjustment, proper wear schedule/adjustment, and proper weight-bearing schedule/adjustment Donning prosthesis: Total A / family & CNA requires 100% cueing Doffing prosthesis: Total A / family & CNA requires 100% cueing Prosthetic wear tolerance: 1-1.5 hours, 1x/day, 6 of 7 days since delivery Prosthetic weight bearing tolerance: 3 minutes during eval with c/o knee pain Edema: pitting Residual limb condition: lateral incision with 3mm X 6mm long scabs with serous drainage, arrived with bandaid over wound,  dry skin, normal temperature & color, cylindrical shape Prosthetic description: silicon liner with pin lock suspension, total contact socket, SACH foot K code/activity level with prosthetic use: Level 1    TODAY'S TREATMENT:  DATE: 06/13/2023: Prosthetic Training with TTA prosthesis: See objective data for transfers, standing balance, gait and prosthetic care/wear.   05/25/2023: Prosthetic Training with TTA prosthesis Sit to stand with RW  and patient pushing off wheelchair with min to mod assist, requires cues for set up, hand placement, and sequence, X 5 reps total Patient ambulated 55 feet max tolerated distance (Spo2 99% and HR 78 after), 31 feet, 30 feet, and 40 feet all with rolling walker and min A with 90 deg turn to Rt and left to sit in chair Standing at sink with one UE support and reaching up with Rt arm to place small bottle of water on/off of shelf X 10 then left arm raising arm only as high as she can X 5 reps.   Therapeutic Exercise Leg press DL 09# 8J19, then single leg 31# X10 reps 2 sets each side with manual assistance at left knee to reduce knee varus/hip rotation Nu step L5 X 7 min UE/LE for functional endurance  05/23/2023: Prosthetic Training with TTA prosthesis Sit to stand with RW and patient pushing off wheelchair with min to mod assist, requires cues for set up, hand placement, and sequence, X 5 reps total Patient ambulated 43 feet max tolerated distance (Spo2 99% and HR 78 after), 31 feet, and 25 feet all with rolling walker and min A with 90 deg turn to Rt and left to sit in chair Standing at sink with one UE support and reaching up with Rt arm to place small bottle of water on/off of shelf X 10 then left arm raising arm only as high as she can X 5 reps.   Therapeutic Exercise Leg press DL 14# 7W29, then single leg 31# X10 reps 2 sets each side with manual assistance at left knee to reduce knee varus/hip rotation Nu step L4 X 7 min UE/LE for functional endurance  05/18/2023: Prosthetic Training with TTA prosthesis Sit to stand with RW and patient pushing off wheelchair with min to mod assist, requires cues for set up, hand placement, and sequence, X 5 reps total Patient ambulated 32 feet, 20 feet, and 25 feet all with rolling walker and min A with 90 deg turn to Rt and left to sit in chair Standing at sink with one UE support and reaching up with Rt arm to place small bottle of water on/off of shelf X  5 then left arm raising arm only as high as she can X 5 reps. One seated rest break then performed one more round of this.    Therapeutic Exercise Leg press DL 56# 2Z30, then single leg 31# X10 reps 2 sets each side with manual assistance at left knee to reduce knee varus/hip rotation Nu step L4 X 7 min UE/LE for functional endurance    HOME EXERCISE PROGRAM:  ASSESSMENT:  CLINICAL IMPRESSION: Patient continues to make slow consistent progress with skilled PT intervention.  Progress is very slow due to multiple medical conditions including amputation, history of CVA, arthritis with pain and dialysis causing fatigue issues.  She has improved her prosthetic wear and use but does require intermittent cueing from PT. patient is improving her ability to transfer and ambulate with family or CNA assistance.  Patient continues to benefit from skilled PT.  OBJECTIVE IMPAIRMENTS: Abnormal gait, decreased activity tolerance, decreased balance, decreased cognition, decreased coordination, decreased endurance, decreased knowledge of condition, decreased knowledge of use of DME, decreased mobility, difficulty walking, decreased ROM, decreased strength, postural dysfunction, prosthetic dependency , obesity, and  pain.   ACTIVITY LIMITATIONS: standing, stairs, transfers, locomotion level, and safe prosthesis use.  PARTICIPATION LIMITATIONS: community activity, church, and household mobility  PERSONAL FACTORS: Age, Fitness, Past/current experiences, Time since onset of injury/illness/exacerbation, and 3+ comorbidities: see PMH  are also affecting patient's functional outcome.   REHAB POTENTIAL: Good  CLINICAL DECISION MAKING: Evolving/moderate complexity  EVALUATION COMPLEXITY: Moderate   GOALS: Goals reviewed with patient? Yes  SHORT TERM GOALS: Target date: 07/14/2023  Patient reports transferring out of w/c >/= 2x/day on non-dialysis days with CNA or family assistance.  Baseline: SEE OBJECTIVE  DATA Goal status: set 06/13/2023 2.  Patient reports knee pain <4/10.  Baseline: SEE OBJECTIVE DATA Goal status:  ongoing 06/13/2023  3.  Patient sit to/from stand modA. Baseline: SEE OBJECTIVE DATA Goal status: ongoing 06/13/2023  4. Patient ambulates 60' between 2 chairs (without w/c following pt) with minA. Baseline: SEE OBJECTIVE DATA Goal status: ongoing 06/13/2023    LONG TERM GOALS: Target date: 09/07/2023  Patient & family / CNA demonstrates & verbalized understanding of prosthetic care to enable safe utilization of prosthesis. Baseline: SEE OBJECTIVE DATA Goal status: progressing but not met ongoing 06/13/2023  Patient tolerates prosthesis wear >90% of awake hours without skin or limb pain issues and knee pain </= 4/10.  Baseline: SEE OBJECTIVE DATA Goal status:  MET 06/13/23 except inconsistent with knee pain  Stand balance with walker: static 2 min with supervision to enable ability for standing ADLS. Baseline: SEE OBJECTIVE DATA Goal status: partially met 06/13/2023  Patient ambulates >50' with RW & prosthesis only with family / CNA assist safely.  Baseline: SEE OBJECTIVE DATA Goal status: progressing but not met ongoing 06/13/2023  Patient transfers stand pivot including sit to/from stand with RW with minA or less.  Baseline: SEE OBJECTIVE DATA Goal status:  progressing but not met ongoing 06/13/2023   PLAN:  PT FREQUENCY: 1-2x/week  PT DURATION: 12 weeks / 90 days  PLANNED INTERVENTIONS: Therapeutic exercises, Therapeutic activity, Neuromuscular re-education, Balance training, Gait training, Patient/Family education, Self Care, Vestibular training, Prosthetic training, DME instructions, and Manual therapy  PLAN FOR NEXT SESSION:  Continue Work on patient walking between 2 chairs and decreasing need for wheelchair to be followed behind patient.  Continue gait and transfers including stand pivot with rolling walker, leg strength, balance, if pt's husband can bring  their car to PT, work on car transfers both stand pivot with RW & scooting with sliding board.    Referring diagnosis? Z61.096E (ICD-10-CM) - Below-knee amputation of right lower extremity  Treatment diagnosis? (if different than referring diagnosis)  Unsteadiness on feet R26.81   Other abnormalities of gait and mobility R26.89   Muscle weakness (generalized) M62.81   Hemiplegia and hemiparesis following cerebral infarction affecting left non-dominant side (HCC) I69.354   Non-pressure chronic ulcer of skin of other sites with other specified severity (HCC) L98.498   Chronic pain of both knees M25.561, M25.562, G89.29   What was this (referring dx) caused by? [x]  Surgery []  Fall []  Ongoing issue []  Arthritis []  Other: ____________   Laterality: [x]  Rt []  Lt []  Both   Check all possible CPT codes:                   *CHOOSE 10 OR LESS*                                [x]  97110 (Therapeutic Exercise)                   []   16109 (SLP Treatment)      [x]  [id number] (Neuro Re-ed)                                []  92526 (Swallowing Treatment)                  [x]  [phone] (Gait Training)                                []  [phone] (Cognitive Training, 1st 15 minutes) []  97140 (Manual Therapy)                           []  97130 (Cognitive Training, each add'l 15 minutes)         [x]  97164 (Re-evaluation)                              []  Other, List CPT Code ____________  [x]  97530 (Therapeutic Activities)                                            [x]  97535 (Self Care)               []  All codes above (97110 - 97535)             []  97012 (Mechanical Traction)             []  97014 (E-stim Unattended)             []  97032 (E-stim manual)             []  97033 (Ionto)             []  97035 (Ultrasound) []  97750 (Physical Performance Training) []  [id number] (Aquatic Therapy) []  97016 (Vasopneumatic Device) []  [id number] (Paraffin) []  97034 (Contrast Bath) []  97597 (Wound Care 1st 20 sq cm) []  97598 (Wound  Care each add'l 20 sq cm) []  97760 (Orthotic Fabrication, Fitting, Training Initial) [x]  [id number] (Prosthetic Management and Training Initial) [x]  [id number] (Orthotic or Prosthetic Training/ Modification Subsequent)  Vladimir Faster, PT, DPT 06/13/2023, 5:48 PM

## 2023-06-15 ENCOUNTER — Ambulatory Visit: Payer: Medicare PPO | Admitting: Physical Therapy

## 2023-06-15 ENCOUNTER — Encounter (INDEPENDENT_AMBULATORY_CARE_PROVIDER_SITE_OTHER): Payer: Medicare PPO | Admitting: Ophthalmology

## 2023-06-15 DIAGNOSIS — R2689 Other abnormalities of gait and mobility: Secondary | ICD-10-CM | POA: Diagnosis not present

## 2023-06-15 DIAGNOSIS — H35033 Hypertensive retinopathy, bilateral: Secondary | ICD-10-CM

## 2023-06-15 DIAGNOSIS — R2681 Unsteadiness on feet: Secondary | ICD-10-CM

## 2023-06-15 DIAGNOSIS — M25562 Pain in left knee: Secondary | ICD-10-CM

## 2023-06-15 DIAGNOSIS — M6281 Muscle weakness (generalized): Secondary | ICD-10-CM

## 2023-06-15 DIAGNOSIS — I69354 Hemiplegia and hemiparesis following cerebral infarction affecting left non-dominant side: Secondary | ICD-10-CM

## 2023-06-15 DIAGNOSIS — Z794 Long term (current) use of insulin: Secondary | ICD-10-CM | POA: Diagnosis not present

## 2023-06-15 DIAGNOSIS — G8929 Other chronic pain: Secondary | ICD-10-CM

## 2023-06-15 DIAGNOSIS — I10 Essential (primary) hypertension: Secondary | ICD-10-CM | POA: Diagnosis not present

## 2023-06-15 DIAGNOSIS — M25561 Pain in right knee: Secondary | ICD-10-CM

## 2023-06-15 DIAGNOSIS — H4312 Vitreous hemorrhage, left eye: Secondary | ICD-10-CM

## 2023-06-15 DIAGNOSIS — H43813 Vitreous degeneration, bilateral: Secondary | ICD-10-CM

## 2023-06-15 DIAGNOSIS — E113513 Type 2 diabetes mellitus with proliferative diabetic retinopathy with macular edema, bilateral: Secondary | ICD-10-CM

## 2023-06-16 ENCOUNTER — Encounter: Payer: Self-pay | Admitting: Physical Therapy

## 2023-06-16 NOTE — Therapy (Signed)
OUTPATIENT PHYSICAL THERAPY TREATMENT   Patient Name: Carolyn Russo MRN: [id number] DOB:15-Jun-1951, 72 y.o., female Today's Date: 06/16/2023  PCP: No PCP on file REFERRING PROVIDER: Aldean Baker, MD     END OF SESSION:  PT End of Session - 06/16/23 0744     Visit Number 27    Number of Visits 46    Date for PT Re-Evaluation 09/07/23    Authorization Type [employer] PPO    Authorization Time Period 12 more visits approaved 09/11/23    Authorization - Visit Number 2    Authorization - Number of Visits 12    Progress Note Due on Visit 36    PT Start Time 1605    PT Stop Time 1647    PT Time Calculation (min) 42 min    Equipment Utilized During Treatment Gait belt    Activity Tolerance Patient limited by fatigue    Behavior During Therapy WFL for tasks assessed/performed                  Past Medical History:  Diagnosis Date   Acute GI bleeding    Allergy    Anemia    Anterior chest wall pain    Appendicitis 1965   Asthma    Body mass index 37.0-37.9, adult    Breast pain    Cataract    both eyes   CHF (congestive heart failure) (HCC)    Chronic kidney disease    stage 5 - on dialysis   Cognitive change 04/20/2021   r/t cva 03/2821   Complication of anesthesia    Memory loss after general   Dehydration 2014   Deviated septum 1971   Diabetes mellitus    Dysphagia due to old stroke    easy to get strangled when eating   Dyspnea 2014   Extrinsic asthma    WITH ASTHMA ATTACK   Fibroid 1980   GERD (gastroesophageal reflux disease)    Heart murmur    History of migraine    History of seizure    with stroke   Hx gestational diabetes    Hyperlipidemia    Hypertension 2014   Inguinal hernia 1959   Malaise and fatigue 2014   Non-IgE mediated allergic asthma 2014   Obesity    Pelvic pain    Pregnancy, high-risk 1985   Stroke (HCC) 04/20/2021   (CVA) of right basal ganglia   Tonsillitis 1968   Uterine fibroid 1980   Visual field  defect    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   Past Surgical History:  Procedure Laterality Date   ABDOMINAL AORTOGRAM W/LOWER EXTREMITY N/A 02/21/2022   Procedure: ABDOMINAL AORTOGRAM W/LOWER EXTREMITY;  Surgeon: Maeola Harman, MD;  Location: [facility] INVASIVE CV LAB;  Service: Cardiovascular;  Laterality: N/A;   AMPUTATION Right 05/18/2022   Procedure: RIGHT BELOW KNEE AMPUTATION;  Surgeon: Nadara Mustard, MD;  Location: [facility] OR;  Service: Orthopedics;  Laterality: Right;   AMPUTATION TOE Right 04/15/2022   Procedure: AMPUTATION  LST TOERIGHT FOOT;  Surgeon: Edwin Cap, DPM;  Location: [facility] ORS;  Service: Podiatry;  Laterality: Right;   APPENDECTOMY  1959   BASCILIC VEIN TRANSPOSITION Right 04/30/2021   Procedure: RIGHT FIRST STAGE BASCILIC VEIN TRANSPOSITION;  Surgeon: Chuck Hint, MD;  Location: [facility] OR;  Service: Vascular;  Laterality: Right;   CESAREAN SECTION  1985   COLONOSCOPY     ESOPHAGOGASTRODUODENOSCOPY (EGD) WITH PROPOFOL N/A 04/18/2021   Procedure: ESOPHAGOGASTRODUODENOSCOPY (EGD) WITH  PROPOFOL;  Surgeon: Iva Boop, MD;  Location: [facility] ENDOSCOPY;  Service: Endoscopy;  Laterality: N/A;   EYE SURGERY     bilateral cataract    FLEXIBLE SIGMOIDOSCOPY N/A 04/18/2021   Procedure: FLEXIBLE SIGMOIDOSCOPY;  Surgeon: Iva Boop, MD;  Location: [facility] ENDOSCOPY;  Service: Endoscopy;  Laterality: N/A;   HERNIA REPAIR  1959   IR FLUORO GUIDE CV LINE RIGHT  03/18/2021   IR US GUIDE VASC ACCESS RIGHT  03/18/2021   LEFT HEART CATH AND CORONARY ANGIOGRAPHY N/A 04/04/2017   Procedure: Left Heart Cath and Coronary Angiography;  Surgeon: Lyn Records, MD;  Location: [facility] INVASIVE CV LAB;  Service: Cardiovascular;  Laterality: N/A;   MYOMECTOMY  1980, 2004, 2007   PERIPHERAL VASCULAR THROMBECTOMY  02/21/2022   Procedure: PERIPHERAL VASCULAR THROMBECTOMY;  Surgeon: Maeola Harman, MD;  Location: [facility] INVASIVE CV LAB;  Service: Cardiovascular;;  Right Tibial   RHINOPLASTY  1971   ROTATOR  CUFF REPAIR  2003   SURGICAL REPAIR OF HEMORRHAGE  2015   TONSILLECTOMY  1968   Patient Active Problem List   Diagnosis Date Noted   Wheelchair dependence 06/09/2023   Hx of right BKA (HCC) 03/10/2023   CHF (congestive heart failure) (HCC) 02/10/2023   Dyspnea 02/09/2023   Acute respiratory distress 02/09/2023   Urinary tract infection without hematuria    Diabetes mellitus due to underlying condition with hypoglycemia without coma, with long-term current use of insulin (HCC)    Seizure disorder (HCC) 06/22/2022   Acute respiratory failure (HCC)    Unresponsiveness    Pressure ulcer 06/07/2022   Acute stroke due to ischemia (HCC) 06/03/2022   Chest pain 06/01/2022   History of CVA (cerebrovascular accident) 06/01/2022   Seizure (HCC)    Encephalopathy    Below-knee amputation of right lower extremity (HCC)    Leukocytosis    Foot infection 05/10/2022   PAD (peripheral artery disease) (HCC) 05/10/2022   Gangrene of right foot (HCC) 04/15/2022   Left hemiparesis (HCC) 09/24/2021   Abnormality of gait 09/24/2021   Pain of right hand 08/06/2021   Steal syndrome of dialysis vascular access (HCC) 08/06/2021   Subclavian steal syndrome of right subclavian artery 06/25/2021   Right hand weakness 06/25/2021   Stroke-like symptoms 05/06/2021   Bandemia    Cerebrovascular accident (CVA) of right basal ganglia (HCC) 04/20/2021   Hemorrhoids    Basal ganglia infarction (HCC) 04/16/2021   ESRD on dialysis (HCC) 04/16/2021   Hypertensive urgency 03/10/2021   Hilar enlargement    Anemia of chronic disease    Chronic diastolic CHF (congestive heart failure) (HCC) 03/09/2021   CKD (chronic kidney disease), stage V (HCC) 03/08/2021   Diabetic retinopathy (HCC) 03/08/2021   Hyperglycemia due to type 2 diabetes mellitus (HCC) 03/08/2021   Pure hypercholesterolemia 03/08/2021   Anemia in chronic kidney disease 09/24/2020   Uncontrolled type 2 diabetes mellitus with hyperglycemia (HCC)  03/19/2019   Osteopenia 09/04/2018   Migraine 09/04/2018   Asthma 09/04/2018   Pseudophakia of both eyes 07/17/2018   Pseudophakia of left eye 07/03/2018   Open angle with borderline findings and high glaucoma risk in left eye 07/03/2018   Age-related nuclear cataract of right eye 07/03/2018   CAD (coronary artery disease), native coronary artery 04/27/2017   Abnormal nuclear stress test 04/04/2017   Shortness of breath 03/09/2017   Appendicitis    Age-related hypermature cataract of both eyes 12/20/2016   Uterine leiomyoma 11/27/2012   Dyslipidemia (high LDL; low HDL)  11/25/2011   Obesity (BMI 30-39.9) 11/25/2011   Hypertensive disorder 11/21/2011   Type 2 diabetes mellitus (HCC) 11/21/2011   Abnormal EKG 11/21/2011    ONSET DATE: 12/29/2022 prosthesis delivery  REFERRING DIAG: U98.119J (ICD-10-CM) - Below-knee amputation of right lower extremity   THERAPY DIAG:  Unsteadiness on feet  Other abnormalities of gait and mobility  Muscle weakness (generalized)  Hemiplegia and hemiparesis following cerebral infarction affecting left non-dominant side (HCC)  Chronic pain of both knees  Rationale for Evaluation and Treatment: Rehabilitation  SUBJECTIVE:   SUBJECTIVE STATEMENT: She endorses more overall back pain today with standing and walking  PERTINENT HISTORY: CVA / left hemiparesis due to right basal ganglia infarct 7/22, right hip bursitis, anxiety, dCHF, ESRD on HD, HTN, DM, CAD  PAIN:  Are you having pain?  Yes: NPRS scale: reports no pain in knee or back sitting, but transition with standing 9/10 at times Pain location: Rt knee, back Pain description: throbbing Aggravating factors: standing, walking Relieving factors: Voltarin & massage  PRECAUTIONS: Fall and Other: NO BP RUE  WEIGHT BEARING RESTRICTIONS: No  FALLS: Has patient fallen in last 6 months? No  LIVING ENVIRONMENT: Lives with: lives with their spouse and CNAs 24 hrs 7 days / wk Lives in:  House Home Access: Ramped entrance Home layout: Multi level, Full bath on main level, and Able to live on main level with bedroom and bathroom Stairs: Yes: Internal: 16 steps; on left going up and External: 4 steps; on right going up Has following equipment at home: Single point cane, Walker - 2 wheeled, Wheelchair (manual), and shower chair  PLOF: Independent with household mobility with device and Independent with community mobility with device  PATIENT GOALS: walk with prosthesis, stand & transfer.   OBJECTIVE:  COGNITION: Overall cognitive status: History of cognitive impairments - at baseline  POSTURE: rounded shoulders, forward head, decreased lumbar lordosis, increased thoracic kyphosis, and flexed trunk   LOWER EXTREMITY ROM: Eval / 01/05/2023:    BLEs PROM WFL except unable to assess hip ext  (Blank rows = not tested)  LOWER EXTREMITY MMT:  MMT Right eval Right 03/06/23  Hip flexion 3-/5 3-/5  Hip extension 3-/5 3-/5  Hip abduction 3-/5 3-/5  Hip adduction    Hip internal rotation    Hip external rotation    Knee flexion 3-/5 3-/5  Knee extension 3-/5 3-/5  Ankle dorsiflexion NA NA  Ankle plantarflexion NA NA  Ankle inversion NA NA  Ankle eversion NA NA  03/06/2023: LLE hemiparesis but able to advance during gait without assistance.   Eval / 01/05/2023: left LE & UE hemiparesis.  In gait slides to advance LLE and knee / hip instability in stance.  (Blank rows = not tested)  TRANSFERS: 06/13/2023: Sit to stand w/c & 18" chair with armrests to RW with modA. Stand to sit with minA. Stand-pivot transfer with RW with minA once pt standing.   05/09/23 Sit to stand w/c & 18" chair with armrests to RW with modA. Stand to sit with minA. Stand-pivot  transfer with RW with modA / constant cueing. Stand-pivot transfer holding onto PT mod A  03/06/2023:  Sit to stand w/c & 18" chair with armrests to RW with modA. Stand to sit with minA. Stand-pivot  transfer with RW with  modA / constant cueing.  02/07/2023: Sit to stand w/c or 18" chair with armrests to RW with modA. Stand to sit with minA. Both require constant cues. Stand-pivot transfer with RW with modA /  constant cueing. (2nd person for safety)   01/05/2023 / Eval: Sit to stand: Max A pulling BUEs on //bars from w/c Stand to sit: Min A using BUEs //bars to control descent Lateral scoot transfers: Mod A w/c to / from mat table level & adjacent   FUNCTIONAL TESTs:  06/13/2023: PT manually cues weight shift over feet with modA initially then pt able to stand 2 min with RW support with close supervision.   03/06/2023: static stance with RW support 60 sec with minA.  Eval / 01/05/2023:  Standing in //bars for 30 sec with modA.   GAIT: 06/13/2023: Pt amb 25' with RW with minA once pt standing.  Pt required w/c following so she can sit immediately upon fatiguing.   03/06/2023: Pt amb 5' with RW with +2 modA including 90* turn to chair.  02/07/2023: Pt amb 5' with RW with +2 modA including 90* turn to chair.   01/05/2023 / Eval: Gait pattern: step to pattern, decreased step length- Right, decreased step length- Left, decreased stance time- Right, decreased stance time- Left, decreased hip/knee flexion- Left, decreased ankle dorsiflexion- Left, Right hip hike, Left hip hike, knee flexed in stance- Left, antalgic, lateral hip instability, trunk flexed, wide BOS, and poor foot clearance- Left Distance walked: 3' Assistive device utilized: // bars & RLE TTA prosthesis Level of assistance: Total A / 2 person Comments:  LLE with poor clearance / slides foot forward and knee / hip instability in stance.   CURRENT PROSTHETIC WEAR ASSESSMENT: 06/13/2023: pt & husband report pt wearing prosthesis all awake hours except during dialysis and ~ 1 hour when her limb starts to throb.  Pt & husband report proper limb care, don/doff and cleaning prosthesis. They require intermittent PT instruction for adjusting ply socks and  weight bearing.   03/06/2023: Patient's husband & CNA correctly don & doff her prosthesis. Pt is dependent requiring maxA to don or doff.  She reports wearing prosthesis 5 hrs 2x/day for 10 hours total on non-dialysis days and up to 8 hours on dialysis days.  Her limb has scab on lateral incision that appears to be internal suture working way out. No signs of infection. Her residual limb appearance is dry skin, normal temperature, limited to no hair growth, cylindrical shape.   Eval / 01/05/2023:  Patient is dependent with: skin check, residual limb care, care of non-amputated limb, prosthetic cleaning, ply sock cleaning, correct ply sock adjustment, proper wear schedule/adjustment, and proper weight-bearing schedule/adjustment Donning prosthesis: Total A / family & CNA requires 100% cueing Doffing prosthesis: Total A / family & CNA requires 100% cueing Prosthetic wear tolerance: 1-1.5 hours, 1x/day, 6 of 7 days since delivery Prosthetic weight bearing tolerance: 3 minutes during eval with c/o knee pain Edema: pitting Residual limb condition: lateral incision with 3mm X 6mm long scabs with serous drainage, arrived with bandaid over wound,  dry skin, normal temperature & color, cylindrical shape Prosthetic description: silicon liner with pin lock suspension, total contact socket, SACH foot K code/activity level with prosthetic use: Level 1    TODAY'S TREATMENT:  DATE: 06/16/2023: Prosthetic Training with TTA prosthesis Sit to stand with RW and patient pushing off wheelchair with min to mod assist, requires cues for set up, hand placement, and sequence, X 5 reps total Patient ambulated 55 feet max tolerated distance (Spo2 99% and HR 69 after), 50 feet, and 40 feet all with rolling walker and min A with 180 deg turn to Rt and left to sit in chair  Therapeutic Exercise Nu step  L5 X 8 min UE/LE for functional endurance  06/13/2023: Prosthetic Training with TTA prosthesis: See objective data for transfers, standing balance, gait and prosthetic care/wear.   HOME EXERCISE PROGRAM:  ASSESSMENT:  CLINICAL IMPRESSION: She required less overall assistance for 180 deg turns to sit in chair after ambulating with RW. She does endorse back pain but as able to complete her session today focusing on improving her walking, transfers, and endurance.   OBJECTIVE IMPAIRMENTS: Abnormal gait, decreased activity tolerance, decreased balance, decreased cognition, decreased coordination, decreased endurance, decreased knowledge of condition, decreased knowledge of use of DME, decreased mobility, difficulty walking, decreased ROM, decreased strength, postural dysfunction, prosthetic dependency , obesity, and pain.   ACTIVITY LIMITATIONS: standing, stairs, transfers, locomotion level, and safe prosthesis use.  PARTICIPATION LIMITATIONS: community activity, church, and household mobility  PERSONAL FACTORS: Age, Fitness, Past/current experiences, Time since onset of injury/illness/exacerbation, and 3+ comorbidities: see PMH  are also affecting patient's functional outcome.   REHAB POTENTIAL: Good  CLINICAL DECISION MAKING: Evolving/moderate complexity  EVALUATION COMPLEXITY: Moderate   GOALS: Goals reviewed with patient? Yes  SHORT TERM GOALS: Target date: 07/14/2023  Patient reports transferring out of w/c >/= 2x/day on non-dialysis days with CNA or family assistance.  Baseline: SEE OBJECTIVE DATA Goal status: set 06/13/2023 2.  Patient reports knee pain <4/10.  Baseline: SEE OBJECTIVE DATA Goal status:  ongoing 06/13/2023  3.  Patient sit to/from stand modA. Baseline: SEE OBJECTIVE DATA Goal status: ongoing 06/13/2023  4. Patient ambulates 60' between 2 chairs (without w/c following pt) with minA. Baseline: SEE OBJECTIVE DATA Goal status: ongoing 06/13/2023    LONG  TERM GOALS: Target date: 09/07/2023  Patient & family / CNA demonstrates & verbalized understanding of prosthetic care to enable safe utilization of prosthesis. Baseline: SEE OBJECTIVE DATA Goal status: progressing but not met ongoing 06/13/2023  Patient tolerates prosthesis wear >90% of awake hours without skin or limb pain issues and knee pain </= 4/10.  Baseline: SEE OBJECTIVE DATA Goal status:  MET 06/13/23 except inconsistent with knee pain  Stand balance with walker: static 2 min with supervision to enable ability for standing ADLS. Baseline: SEE OBJECTIVE DATA Goal status: partially met 06/13/2023  Patient ambulates >50' with RW & prosthesis only with family / CNA assist safely.  Baseline: SEE OBJECTIVE DATA Goal status: progressing but not met ongoing 06/13/2023  Patient transfers stand pivot including sit to/from stand with RW with minA or less.  Baseline: SEE OBJECTIVE DATA Goal status:  progressing but not met ongoing 06/13/2023   PLAN:  PT FREQUENCY: 1-2x/week  PT DURATION: 12 weeks / 90 days  PLANNED INTERVENTIONS: Therapeutic exercises, Therapeutic activity, Neuromuscular re-education, Balance training, Gait training, Patient/Family education, Self Care, Vestibular training, Prosthetic training, DME instructions, and Manual therapy  PLAN FOR NEXT SESSION:  Continue Work on patient walking between 2 chairs and decreasing need for wheelchair to be followed behind patient.  Continue gait and transfers including stand pivot with rolling walker, leg strength, balance, if pt's husband can bring their car to PT, work on  car transfers both stand pivot with RW & scooting with sliding board.    Referring diagnosis? Z61.096E (ICD-10-CM) - Below-knee amputation of right lower extremity  Treatment diagnosis? (if different than referring diagnosis)  Unsteadiness on feet R26.81   Other abnormalities of gait and mobility R26.89   Muscle weakness (generalized) M62.81   Hemiplegia  and hemiparesis following cerebral infarction affecting left non-dominant side (HCC) I69.354   Non-pressure chronic ulcer of skin of other sites with other specified severity [facility]) L98.498   Chronic pain of both knees M25.561, M25.562, G89.29   What was this (referring dx) caused by? [x]  Surgery []  Fall []  Ongoing issue []  Arthritis []  Other: ____________   Laterality: [x]  Rt []  Lt []  Both   Check all possible CPT codes:                   *CHOOSE 10 OR LESS*                                [x]  97110 (Therapeutic Exercise)                   []  92507 (SLP Treatment)      [x]  97112 (Neuro Re-ed)                                []  92526 (Swallowing Treatment)                  [x]  97116 (Gait Training)                                []  [id number] (Cognitive Training, 1st 15 minutes) []  97140 (Manual Therapy)                           []  97130 (Cognitive Training, each add'l 15 minutes)         [x]  97164 (Re-evaluation)                              []  Other, List CPT Code ____________  [x]  97530 (Therapeutic Activities)                                            [x]  97535 (Self Care)               []  All codes above (97110 - 97535)             []  97012 (Mechanical Traction)             []  45409 (E-stim Unattended)             []  97032 (E-stim manual)             []  97033 (Ionto)             []  97035 (Ultrasound) []  97750 (Physical Performance Training) []  [id number] (Aquatic Therapy) []  97016 (Vasopneumatic Device) []  [id number] (Paraffin) []  97034 (Contrast Bath) []  97597 (Wound Care 1st 20 sq cm) []  97598 (Wound Care each add'l 20 sq cm) []  97760 (Orthotic Fabrication, Fitting, Training Initial) [x]  [id number] (Prosthetic Management and Training Initial) [  x] [phone] (Orthotic or Prosthetic Training/ Modification Subsequent)  April Manson, PT, DPT 06/16/2023, 7:48 AM

## 2023-06-20 ENCOUNTER — Ambulatory Visit: Payer: Medicare PPO | Admitting: Physical Therapy

## 2023-06-20 ENCOUNTER — Encounter: Payer: Self-pay | Admitting: Physical Therapy

## 2023-06-20 DIAGNOSIS — M6281 Muscle weakness (generalized): Secondary | ICD-10-CM

## 2023-06-20 DIAGNOSIS — R208 Other disturbances of skin sensation: Secondary | ICD-10-CM

## 2023-06-20 DIAGNOSIS — M79641 Pain in right hand: Secondary | ICD-10-CM

## 2023-06-20 DIAGNOSIS — R2689 Other abnormalities of gait and mobility: Secondary | ICD-10-CM

## 2023-06-20 DIAGNOSIS — M25512 Pain in left shoulder: Secondary | ICD-10-CM | POA: Diagnosis not present

## 2023-06-20 DIAGNOSIS — L98498 Non-pressure chronic ulcer of skin of other sites with other specified severity: Secondary | ICD-10-CM

## 2023-06-20 DIAGNOSIS — R2681 Unsteadiness on feet: Secondary | ICD-10-CM

## 2023-06-20 DIAGNOSIS — I69354 Hemiplegia and hemiparesis following cerebral infarction affecting left non-dominant side: Secondary | ICD-10-CM

## 2023-06-20 NOTE — Therapy (Signed)
OUTPATIENT PHYSICAL THERAPY TREATMENT   Patient Name: Carolyn Russo MRN: [id number] DOB:18-Apr-1951, 72 y.o., female Today's Date: 06/20/2023  PCP: No PCP on file REFERRING PROVIDER: Aldean Baker, MD     END OF SESSION:  PT End of Session - 06/20/23 2202     Visit Number 28    Number of Visits 46    Date for PT Re-Evaluation 09/07/23    Authorization Type [employer] PPO    Authorization Time Period 12 more visits approaved 09/11/23    Authorization - Visit Number 3    Authorization - Number of Visits 12    Progress Note Due on Visit 36    PT Start Time 1515    PT Stop Time 1610    PT Time Calculation (min) 55 min    Equipment Utilized During Treatment Gait belt    Activity Tolerance Patient limited by fatigue    Behavior During Therapy WFL for tasks assessed/performed                  Past Medical History:  Diagnosis Date   Acute GI bleeding    Allergy    Anemia    Anterior chest wall pain    Appendicitis 1965   Asthma    Body mass index 37.0-37.9, adult    Breast pain    Cataract    both eyes   CHF (congestive heart failure) (HCC)    Chronic kidney disease    stage 5 - on dialysis   Cognitive change 04/20/2021   r/t cva 03/2821   Complication of anesthesia    Memory loss after general   Dehydration 2014   Deviated septum 1971   Diabetes mellitus    Dysphagia due to old stroke    easy to get strangled when eating   Dyspnea 2014   Extrinsic asthma    WITH ASTHMA ATTACK   Fibroid 1980   GERD (gastroesophageal reflux disease)    Heart murmur    History of migraine    History of seizure    with stroke   Hx gestational diabetes    Hyperlipidemia    Hypertension 2014   Inguinal hernia 1959   Malaise and fatigue 2014   Non-IgE mediated allergic asthma 2014   Obesity    Pelvic pain    Pregnancy, high-risk 1985   Stroke (HCC) 04/20/2021   (CVA) of right basal ganglia   Tonsillitis 1968   Uterine fibroid 1980   Visual field  defect    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   Past Surgical History:  Procedure Laterality Date   ABDOMINAL AORTOGRAM W/LOWER EXTREMITY N/A 02/21/2022   Procedure: ABDOMINAL AORTOGRAM W/LOWER EXTREMITY;  Surgeon: Maeola Harman, MD;  Location: [facility] INVASIVE CV LAB;  Service: Cardiovascular;  Laterality: N/A;   AMPUTATION Right 05/18/2022   Procedure: RIGHT BELOW KNEE AMPUTATION;  Surgeon: Nadara Mustard, MD;  Location: [facility] OR;  Service: Orthopedics;  Laterality: Right;   AMPUTATION TOE Right 04/15/2022   Procedure: AMPUTATION  LST TOERIGHT FOOT;  Surgeon: Edwin Cap, DPM;  Location: [facility] ORS;  Service: Podiatry;  Laterality: Right;   APPENDECTOMY  1959   BASCILIC VEIN TRANSPOSITION Right 04/30/2021   Procedure: RIGHT FIRST STAGE BASCILIC VEIN TRANSPOSITION;  Surgeon: Chuck Hint, MD;  Location: [facility] OR;  Service: Vascular;  Laterality: Right;   CESAREAN SECTION  1985   COLONOSCOPY     ESOPHAGOGASTRODUODENOSCOPY (EGD) WITH PROPOFOL N/A 04/18/2021   Procedure: ESOPHAGOGASTRODUODENOSCOPY (EGD) WITH  PROPOFOL;  Surgeon: Iva Boop, MD;  Location: [facility] ENDOSCOPY;  Service: Endoscopy;  Laterality: N/A;   EYE SURGERY     bilateral cataract    FLEXIBLE SIGMOIDOSCOPY N/A 04/18/2021   Procedure: FLEXIBLE SIGMOIDOSCOPY;  Surgeon: Iva Boop, MD;  Location: [facility] ENDOSCOPY;  Service: Endoscopy;  Laterality: N/A;   HERNIA REPAIR  1959   IR FLUORO GUIDE CV LINE RIGHT  03/18/2021   IR US GUIDE VASC ACCESS RIGHT  03/18/2021   LEFT HEART CATH AND CORONARY ANGIOGRAPHY N/A 04/04/2017   Procedure: Left Heart Cath and Coronary Angiography;  Surgeon: Lyn Records, MD;  Location: [facility] INVASIVE CV LAB;  Service: Cardiovascular;  Laterality: N/A;   MYOMECTOMY  1980, 2004, 2007   PERIPHERAL VASCULAR THROMBECTOMY  02/21/2022   Procedure: PERIPHERAL VASCULAR THROMBECTOMY;  Surgeon: Maeola Harman, MD;  Location: [facility] INVASIVE CV LAB;  Service: Cardiovascular;;  Right Tibial   RHINOPLASTY  1971   ROTATOR  CUFF REPAIR  2003   SURGICAL REPAIR OF HEMORRHAGE  2015   TONSILLECTOMY  1968   Patient Active Problem List   Diagnosis Date Noted   Wheelchair dependence 06/09/2023   Hx of right BKA (HCC) 03/10/2023   CHF (congestive heart failure) (HCC) 02/10/2023   Dyspnea 02/09/2023   Acute respiratory distress 02/09/2023   Urinary tract infection without hematuria    Diabetes mellitus due to underlying condition with hypoglycemia without coma, with long-term current use of insulin (HCC)    Seizure disorder (HCC) 06/22/2022   Acute respiratory failure (HCC)    Unresponsiveness    Pressure ulcer 06/07/2022   Acute stroke due to ischemia (HCC) 06/03/2022   Chest pain 06/01/2022   History of CVA (cerebrovascular accident) 06/01/2022   Seizure (HCC)    Encephalopathy    Below-knee amputation of right lower extremity (HCC)    Leukocytosis    Foot infection 05/10/2022   PAD (peripheral artery disease) (HCC) 05/10/2022   Gangrene of right foot (HCC) 04/15/2022   Left hemiparesis (HCC) 09/24/2021   Abnormality of gait 09/24/2021   Pain of right hand 08/06/2021   Steal syndrome of dialysis vascular access (HCC) 08/06/2021   Subclavian steal syndrome of right subclavian artery 06/25/2021   Right hand weakness 06/25/2021   Stroke-like symptoms 05/06/2021   Bandemia    Cerebrovascular accident (CVA) of right basal ganglia (HCC) 04/20/2021   Hemorrhoids    Basal ganglia infarction (HCC) 04/16/2021   ESRD on dialysis (HCC) 04/16/2021   Hypertensive urgency 03/10/2021   Hilar enlargement    Anemia of chronic disease    Chronic diastolic CHF (congestive heart failure) (HCC) 03/09/2021   CKD (chronic kidney disease), stage V (HCC) 03/08/2021   Diabetic retinopathy (HCC) 03/08/2021   Hyperglycemia due to type 2 diabetes mellitus (HCC) 03/08/2021   Pure hypercholesterolemia 03/08/2021   Anemia in chronic kidney disease 09/24/2020   Uncontrolled type 2 diabetes mellitus with hyperglycemia (HCC)  03/19/2019   Osteopenia 09/04/2018   Migraine 09/04/2018   Asthma 09/04/2018   Pseudophakia of both eyes 07/17/2018   Pseudophakia of left eye 07/03/2018   Open angle with borderline findings and high glaucoma risk in left eye 07/03/2018   Age-related nuclear cataract of right eye 07/03/2018   CAD (coronary artery disease), native coronary artery 04/27/2017   Abnormal nuclear stress test 04/04/2017   Shortness of breath 03/09/2017   Appendicitis    Age-related hypermature cataract of both eyes 12/20/2016   Uterine leiomyoma 11/27/2012   Dyslipidemia (high LDL; low HDL)  11/25/2011   Obesity (BMI 30-39.9) 11/25/2011   Hypertensive disorder 11/21/2011   Type 2 diabetes mellitus (HCC) 11/21/2011   Abnormal EKG 11/21/2011    ONSET DATE: 12/29/2022 prosthesis delivery  REFERRING DIAG: C62.376E (ICD-10-CM) - Below-knee amputation of right lower extremity   THERAPY DIAG:  Pain in right hand  Acute pain of left shoulder  Other disturbances of skin sensation  Rationale for Evaluation and Treatment: Rehabilitation  SUBJECTIVE:   SUBJECTIVE STATEMENT: She continues to report overall back pain and wrist pain today with standing and walking  PERTINENT HISTORY: CVA / left hemiparesis due to right basal ganglia infarct 7/22, right hip bursitis, anxiety, dCHF, ESRD on HD, HTN, DM, CAD  PAIN:  Are you having pain?  Yes: NPRS scale: reports no pain in knee or back sitting, but transition with standing 9/10 at times Pain location: Rt knee, back Pain description: throbbing Aggravating factors: standing, walking Relieving factors: Voltarin & massage  PRECAUTIONS: Fall and Other: NO BP RUE  WEIGHT BEARING RESTRICTIONS: No  FALLS: Has patient fallen in last 6 months? No  LIVING ENVIRONMENT: Lives with: lives with their spouse and CNAs 24 hrs 7 days / wk Lives in: House Home Access: Ramped entrance Home layout: Multi level, Full bath on main level, and Able to live on main level with  bedroom and bathroom Stairs: Yes: Internal: 16 steps; on left going up and External: 4 steps; on right going up Has following equipment at home: Single point cane, Walker - 2 wheeled, Wheelchair (manual), and shower chair  PLOF: Independent with household mobility with device and Independent with community mobility with device  PATIENT GOALS: walk with prosthesis, stand & transfer.   OBJECTIVE:  COGNITION: Overall cognitive status: History of cognitive impairments - at baseline  POSTURE: rounded shoulders, forward head, decreased lumbar lordosis, increased thoracic kyphosis, and flexed trunk   LOWER EXTREMITY ROM: Eval / 01/05/2023:    BLEs PROM WFL except unable to assess hip ext  (Blank rows = not tested)  LOWER EXTREMITY MMT:  MMT Right eval Right 03/06/23  Hip flexion 3-/5 3-/5  Hip extension 3-/5 3-/5  Hip abduction 3-/5 3-/5  Hip adduction    Hip internal rotation    Hip external rotation    Knee flexion 3-/5 3-/5  Knee extension 3-/5 3-/5  Ankle dorsiflexion NA NA  Ankle plantarflexion NA NA  Ankle inversion NA NA  Ankle eversion NA NA  03/06/2023: LLE hemiparesis but able to advance during gait without assistance.   Eval / 01/05/2023: left LE & UE hemiparesis.  In gait slides to advance LLE and knee / hip instability in stance.  (Blank rows = not tested)  TRANSFERS: 06/13/2023: Sit to stand w/c & 18" chair with armrests to RW with modA. Stand to sit with minA. Stand-pivot transfer with RW with minA once pt standing.   05/09/23 Sit to stand w/c & 18" chair with armrests to RW with modA. Stand to sit with minA. Stand-pivot  transfer with RW with modA / constant cueing. Stand-pivot transfer holding onto PT mod A  03/06/2023:  Sit to stand w/c & 18" chair with armrests to RW with modA. Stand to sit with minA. Stand-pivot  transfer with RW with modA / constant cueing.  02/07/2023: Sit to stand w/c or 18" chair with armrests to RW with modA. Stand to sit with minA.  Both require constant cues. Stand-pivot transfer with RW with modA / constant cueing. (2nd person for safety)   01/05/2023 / Eval:  Sit to stand: Max A pulling BUEs on //bars from w/c Stand to sit: Min A using BUEs //bars to control descent Lateral scoot transfers: Mod A w/c to / from mat table level & adjacent   FUNCTIONAL TESTs:  06/13/2023: PT manually cues weight shift over feet with modA initially then pt able to stand 2 min with RW support with close supervision.   03/06/2023: static stance with RW support 60 sec with minA.  Eval / 01/05/2023:  Standing in //bars for 30 sec with modA.   GAIT: 06/13/2023: Pt amb 25' with RW with minA once pt standing.  Pt required w/c following so she can sit immediately upon fatiguing.   03/06/2023: Pt amb 5' with RW with +2 modA including 90* turn to chair.  02/07/2023: Pt amb 5' with RW with +2 modA including 90* turn to chair.   01/05/2023 / Eval: Gait pattern: step to pattern, decreased step length- Right, decreased step length- Left, decreased stance time- Right, decreased stance time- Left, decreased hip/knee flexion- Left, decreased ankle dorsiflexion- Left, Right hip hike, Left hip hike, knee flexed in stance- Left, antalgic, lateral hip instability, trunk flexed, wide BOS, and poor foot clearance- Left Distance walked: 3' Assistive device utilized: // bars & RLE TTA prosthesis Level of assistance: Total A / 2 person Comments:  LLE with poor clearance / slides foot forward and knee / hip instability in stance.   CURRENT PROSTHETIC WEAR ASSESSMENT: 06/13/2023: pt & husband report pt wearing prosthesis all awake hours except during dialysis and ~ 1 hour when her limb starts to throb.  Pt & husband report proper limb care, don/doff and cleaning prosthesis. They require intermittent PT instruction for adjusting ply socks and weight bearing.   03/06/2023: Patient's husband & CNA correctly don & doff her prosthesis. Pt is dependent requiring maxA to  don or doff.  She reports wearing prosthesis 5 hrs 2x/day for 10 hours total on non-dialysis days and up to 8 hours on dialysis days.  Her limb has scab on lateral incision that appears to be internal suture working way out. No signs of infection. Her residual limb appearance is dry skin, normal temperature, limited to no hair growth, cylindrical shape.   Eval / 01/05/2023:  Patient is dependent with: skin check, residual limb care, care of non-amputated limb, prosthetic cleaning, ply sock cleaning, correct ply sock adjustment, proper wear schedule/adjustment, and proper weight-bearing schedule/adjustment Donning prosthesis: Total A / family & CNA requires 100% cueing Doffing prosthesis: Total A / family & CNA requires 100% cueing Prosthetic wear tolerance: 1-1.5 hours, 1x/day, 6 of 7 days since delivery Prosthetic weight bearing tolerance: 3 minutes during eval with c/o knee pain Edema: pitting Residual limb condition: lateral incision with 3mm X 6mm long scabs with serous drainage, arrived with bandaid over wound,  dry skin, normal temperature & color, cylindrical shape Prosthetic description: silicon liner with pin lock suspension, total contact socket, SACH foot K code/activity level with prosthetic use: Level 1    TODAY'S TREATMENT:  DATE: 06/20/2023: Prosthetic Training with TTA prosthesis Sit to stand with RW and patient pushing off wheelchair with min to mod assist, requires cues for set up, hand placement, and sequence, X 5 reps total Patient ambulated 55 feet max tolerated distance (Spo2 99% and HR 69 after), 50 feet, and 40 feet all with rolling walker and min A with 180 deg turn to Rt and left to sit in chair Stand pivot transfer with mod A to and from wheelchair to leg press and back in preparation/simulation for car transfer Car transfer stand pivot transfer  from wheelchair to car seat and back with PT mod A, car transfer with RW from wheelchair to car and back with mod A with PT. Then had CNA try this with her using RW but not able to complete due to patient not being able to back up close enough to car seat so PT took over, then had CNA transfer her back from car seat to wheelchair with PT supervision  Therapeutic Exercise Nu step L5 X 8 min UE/LE for functional endurance Leg press DL 52# [id number], then SL 24# 4W10 each side  06/16/2023: Prosthetic Training with TTA prosthesis Sit to stand with RW and patient pushing off wheelchair with min to mod assist, requires cues for set up, hand placement, and sequence, X 5 reps total Patient ambulated 55 feet max tolerated distance (Spo2 99% and HR 69 after), 50 feet, and 40 feet all with rolling walker and min A with 180 deg turn to Rt and left to sit in chair  Therapeutic Exercise Nu step L5 X 8 min UE/LE for functional endurance  06/13/2023: Prosthetic Training with TTA prosthesis: See objective data for transfers, standing balance, gait and prosthetic care/wear.   HOME EXERCISE PROGRAM:  ASSESSMENT:  CLINICAL IMPRESSION: We practiced car transfers today and she is able to perform this with PT with mod A overall. She can do this with CNA using stand pivot transfer but not yet ready to do this with RW so we will keep practicing this to improve community accessibility.   OBJECTIVE IMPAIRMENTS: Abnormal gait, decreased activity tolerance, decreased balance, decreased cognition, decreased coordination, decreased endurance, decreased knowledge of condition, decreased knowledge of use of DME, decreased mobility, difficulty walking, decreased ROM, decreased strength, postural dysfunction, prosthetic dependency , obesity, and pain.   ACTIVITY LIMITATIONS: standing, stairs, transfers, locomotion level, and safe prosthesis use.  PARTICIPATION LIMITATIONS: community activity, church, and household  mobility  PERSONAL FACTORS: Age, Fitness, Past/current experiences, Time since onset of injury/illness/exacerbation, and 3+ comorbidities: see PMH  are also affecting patient's functional outcome.   REHAB POTENTIAL: Good  CLINICAL DECISION MAKING: Evolving/moderate complexity  EVALUATION COMPLEXITY: Moderate   GOALS: Goals reviewed with patient? Yes  SHORT TERM GOALS: Target date: 07/14/2023  Patient reports transferring out of w/c >/= 2x/day on non-dialysis days with CNA or family assistance.  Baseline: SEE OBJECTIVE DATA Goal status: set 06/13/2023 2.  Patient reports knee pain <4/10.  Baseline: SEE OBJECTIVE DATA Goal status:  ongoing 06/13/2023  3.  Patient sit to/from stand modA. Baseline: SEE OBJECTIVE DATA Goal status: ongoing 06/13/2023  4. Patient ambulates 28' between 2 chairs (without w/c following pt) with minA. Baseline: SEE OBJECTIVE DATA Goal status: ongoing 06/13/2023    LONG TERM GOALS: Target date: 09/07/2023  Patient & family / CNA demonstrates & verbalized understanding of prosthetic care to enable safe utilization of prosthesis. Baseline: SEE OBJECTIVE DATA Goal status: progressing but not met ongoing 06/13/2023  Patient tolerates prosthesis wear >  90% of awake hours without skin or limb pain issues and knee pain </= 4/10.  Baseline: SEE OBJECTIVE DATA Goal status:  MET 06/13/23 except inconsistent with knee pain  Stand balance with walker: static 2 min with supervision to enable ability for standing ADLS. Baseline: SEE OBJECTIVE DATA Goal status: partially met 06/13/2023  Patient ambulates >50' with RW & prosthesis only with family / CNA assist safely.  Baseline: SEE OBJECTIVE DATA Goal status: progressing but not met ongoing 06/13/2023  Patient transfers stand pivot including sit to/from stand with RW with minA or less.  Baseline: SEE OBJECTIVE DATA Goal status:  progressing but not met ongoing 06/13/2023   PLAN:  PT FREQUENCY: 1-2x/week  PT  DURATION: 12 weeks / 90 days  PLANNED INTERVENTIONS: Therapeutic exercises, Therapeutic activity, Neuromuscular re-education, Balance training, Gait training, Patient/Family education, Self Care, Vestibular training, Prosthetic training, DME instructions, and Manual therapy  PLAN FOR NEXT SESSION:  Continue Work on patient walking between 2 chairs and decreasing need for wheelchair to be followed behind patient.  Continue gait and transfers including stand pivot with rolling walker, leg strength, balance, if pt's husband can bring their car to PT, work on car transfers both stand pivot with RW & scooting with sliding board.    Referring diagnosis? A21.308M (ICD-10-CM) - Below-knee amputation of right lower extremity  Treatment diagnosis? (if different than referring diagnosis)  Unsteadiness on feet R26.81   Other abnormalities of gait and mobility R26.89   Muscle weakness (generalized) M62.81   Hemiplegia and hemiparesis following cerebral infarction affecting left non-dominant side (HCC) I69.354   Non-pressure chronic ulcer of skin of other sites with other specified severity [facility]) L98.498   Chronic pain of both knees M25.561, M25.562, G89.29   What was this (referring dx) caused by? [x]  Surgery []  Fall []  Ongoing issue []  Arthritis []  Other: ____________   Laterality: [x]  Rt []  Lt []  Both   Check all possible CPT codes:                   *CHOOSE 10 OR LESS*                                [x]  97110 (Therapeutic Exercise)                   []  92507 (SLP Treatment)      [x]  97112 (Neuro Re-ed)                                []  92526 (Swallowing Treatment)                  [x]  97116 (Gait Training)                                []  [id number] (Cognitive Training, 1st 15 minutes) []  97140 (Manual Therapy)                           []  97130 (Cognitive Training, each add'l 15 minutes)         [x]  97164 (Re-evaluation)                              []  Other, List CPT Code ____________  [  x]  97530 (Therapeutic Activities)                                            [x]  54098 (Self Care)               []  All codes above (97110 - 97535)             []  97012 (Mechanical Traction)             []  97014 (E-stim Unattended)             []  97032 (E-stim manual)             []  97033 (Ionto)             []  97035 (Ultrasound) []  97750 (Physical Performance Training) []  [phone] (Aquatic Therapy) []  97016 (Vasopneumatic Device) []  [id number] (Paraffin) []  97034 (Contrast Bath) []  97597 (Wound Care 1st 20 sq cm) []  97598 (Wound Care each add'l 20 sq cm) []  97760 (Orthotic Fabrication, Fitting, Training Initial) [x]  [id number] (Prosthetic Management and Training Initial) [x]  [id number] (Orthotic or Prosthetic Training/ Modification Subsequent)  April Manson, PT, DPT 06/20/2023, 10:03 PM

## 2023-06-22 ENCOUNTER — Encounter: Payer: Self-pay | Admitting: Physical Therapy

## 2023-06-22 ENCOUNTER — Ambulatory Visit: Payer: Medicare PPO | Admitting: Physical Therapy

## 2023-06-22 DIAGNOSIS — L98498 Non-pressure chronic ulcer of skin of other sites with other specified severity: Secondary | ICD-10-CM | POA: Diagnosis not present

## 2023-06-22 DIAGNOSIS — R2681 Unsteadiness on feet: Secondary | ICD-10-CM

## 2023-06-22 DIAGNOSIS — R2689 Other abnormalities of gait and mobility: Secondary | ICD-10-CM | POA: Diagnosis not present

## 2023-06-22 DIAGNOSIS — M6281 Muscle weakness (generalized): Secondary | ICD-10-CM | POA: Diagnosis not present

## 2023-06-22 NOTE — Therapy (Signed)
OUTPATIENT PHYSICAL THERAPY TREATMENT   Patient Name: Carolyn Russo MRN: [id number] DOB:03-31-1951, 72 y.o., female Today's Date: 06/22/2023  PCP: No PCP on file REFERRING PROVIDER: Aldean Baker, MD     END OF SESSION:  PT End of Session - 06/22/23 1638     Visit Number 29    Number of Visits 46    Date for PT Re-Evaluation 09/07/23    Authorization Type [employer] PPO    Authorization Time Period 12 more visits approaved 09/11/23    Authorization - Number of Visits 12    Progress Note Due on Visit 36    PT Start Time 1600    PT Stop Time 1645    PT Time Calculation (min) 45 min    Equipment Utilized During Treatment Gait belt    Activity Tolerance Patient limited by fatigue    Behavior During Therapy WFL for tasks assessed/performed                  Past Medical History:  Diagnosis Date   Acute GI bleeding    Allergy    Anemia    Anterior chest wall pain    Appendicitis 1965   Asthma    Body mass index 37.0-37.9, adult    Breast pain    Cataract    both eyes   CHF (congestive heart failure) (HCC)    Chronic kidney disease    stage 5 - on dialysis   Cognitive change 04/20/2021   r/t cva 03/2821   Complication of anesthesia    Memory loss after general   Dehydration 2014   Deviated septum 1971   Diabetes mellitus    Dysphagia due to old stroke    easy to get strangled when eating   Dyspnea 2014   Extrinsic asthma    WITH ASTHMA ATTACK   Fibroid 1980   GERD (gastroesophageal reflux disease)    Heart murmur    History of migraine    History of seizure    with stroke   Hx gestational diabetes    Hyperlipidemia    Hypertension 2014   Inguinal hernia 1959   Malaise and fatigue 2014   Non-IgE mediated allergic asthma 2014   Obesity    Pelvic pain    Pregnancy, high-risk 1985   Stroke (HCC) 04/20/2021   (CVA) of right basal ganglia   Tonsillitis 1968   Uterine fibroid 1980   Visual field defect    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    Past Surgical History:  Procedure Laterality Date   ABDOMINAL AORTOGRAM W/LOWER EXTREMITY N/A 02/21/2022   Procedure: ABDOMINAL AORTOGRAM W/LOWER EXTREMITY;  Surgeon: Maeola Harman, MD;  Location: [facility] INVASIVE CV LAB;  Service: Cardiovascular;  Laterality: N/A;   AMPUTATION Right 05/18/2022   Procedure: RIGHT BELOW KNEE AMPUTATION;  Surgeon: Nadara Mustard, MD;  Location: [facility] OR;  Service: Orthopedics;  Laterality: Right;   AMPUTATION TOE Right 04/15/2022   Procedure: AMPUTATION  LST TOERIGHT FOOT;  Surgeon: Edwin Cap, DPM;  Location: [facility] ORS;  Service: Podiatry;  Laterality: Right;   APPENDECTOMY  1959   BASCILIC VEIN TRANSPOSITION Right 04/30/2021   Procedure: RIGHT FIRST STAGE BASCILIC VEIN TRANSPOSITION;  Surgeon: Chuck Hint, MD;  Location: [facility] OR;  Service: Vascular;  Laterality: Right;   CESAREAN SECTION  1985   COLONOSCOPY     ESOPHAGOGASTRODUODENOSCOPY (EGD) WITH PROPOFOL N/A 04/18/2021   Procedure: ESOPHAGOGASTRODUODENOSCOPY (EGD) WITH PROPOFOL;  Surgeon: Iva Boop, MD;  Location: [facility] ENDOSCOPY;  Service: Endoscopy;  Laterality: N/A;   EYE SURGERY     bilateral cataract    FLEXIBLE SIGMOIDOSCOPY N/A 04/18/2021   Procedure: FLEXIBLE SIGMOIDOSCOPY;  Surgeon: Iva Boop, MD;  Location: [facility] ENDOSCOPY;  Service: Endoscopy;  Laterality: N/A;   HERNIA REPAIR  1959   IR FLUORO GUIDE CV LINE RIGHT  03/18/2021   IR US GUIDE VASC ACCESS RIGHT  03/18/2021   LEFT HEART CATH AND CORONARY ANGIOGRAPHY N/A 04/04/2017   Procedure: Left Heart Cath and Coronary Angiography;  Surgeon: Lyn Records, MD;  Location: [facility] INVASIVE CV LAB;  Service: Cardiovascular;  Laterality: N/A;   MYOMECTOMY  1980, 2004, 2007   PERIPHERAL VASCULAR THROMBECTOMY  02/21/2022   Procedure: PERIPHERAL VASCULAR THROMBECTOMY;  Surgeon: Maeola Harman, MD;  Location: [facility] INVASIVE CV LAB;  Service: Cardiovascular;;  Right Tibial   RHINOPLASTY  1971   ROTATOR CUFF REPAIR  2003   SURGICAL  REPAIR OF HEMORRHAGE  2015   TONSILLECTOMY  1968   Patient Active Problem List   Diagnosis Date Noted   Wheelchair dependence 06/09/2023   Hx of right BKA (HCC) 03/10/2023   CHF (congestive heart failure) (HCC) 02/10/2023   Dyspnea 02/09/2023   Acute respiratory distress 02/09/2023   Urinary tract infection without hematuria    Diabetes mellitus due to underlying condition with hypoglycemia without coma, with long-term current use of insulin (HCC)    Seizure disorder (HCC) 06/22/2022   Acute respiratory failure (HCC)    Unresponsiveness    Pressure ulcer 06/07/2022   Acute stroke due to ischemia (HCC) 06/03/2022   Chest pain 06/01/2022   History of CVA (cerebrovascular accident) 06/01/2022   Seizure (HCC)    Encephalopathy    Below-knee amputation of right lower extremity (HCC)    Leukocytosis    Foot infection 05/10/2022   PAD (peripheral artery disease) (HCC) 05/10/2022   Gangrene of right foot (HCC) 04/15/2022   Left hemiparesis (HCC) 09/24/2021   Abnormality of gait 09/24/2021   Pain of right hand 08/06/2021   Steal syndrome of dialysis vascular access (HCC) 08/06/2021   Subclavian steal syndrome of right subclavian artery 06/25/2021   Right hand weakness 06/25/2021   Stroke-like symptoms 05/06/2021   Bandemia    Cerebrovascular accident (CVA) of right basal ganglia (HCC) 04/20/2021   Hemorrhoids    Basal ganglia infarction (HCC) 04/16/2021   ESRD on dialysis (HCC) 04/16/2021   Hypertensive urgency 03/10/2021   Hilar enlargement    Anemia of chronic disease    Chronic diastolic CHF (congestive heart failure) (HCC) 03/09/2021   CKD (chronic kidney disease), stage V (HCC) 03/08/2021   Diabetic retinopathy (HCC) 03/08/2021   Hyperglycemia due to type 2 diabetes mellitus (HCC) 03/08/2021   Pure hypercholesterolemia 03/08/2021   Anemia in chronic kidney disease 09/24/2020   Uncontrolled type 2 diabetes mellitus with hyperglycemia (HCC) 03/19/2019   Osteopenia 09/04/2018    Migraine 09/04/2018   Asthma 09/04/2018   Pseudophakia of both eyes 07/17/2018   Pseudophakia of left eye 07/03/2018   Open angle with borderline findings and high glaucoma risk in left eye 07/03/2018   Age-related nuclear cataract of right eye 07/03/2018   CAD (coronary artery disease), native coronary artery 04/27/2017   Abnormal nuclear stress test 04/04/2017   Shortness of breath 03/09/2017   Appendicitis    Age-related hypermature cataract of both eyes 12/20/2016   Uterine leiomyoma 11/27/2012   Dyslipidemia (high LDL; low HDL) 11/25/2011   Obesity (BMI 30-39.9) 11/25/2011  Hypertensive disorder 11/21/2011   Type 2 diabetes mellitus (HCC) 11/21/2011   Abnormal EKG 11/21/2011    ONSET DATE: 12/29/2022 prosthesis delivery  REFERRING DIAG: V78.469G (ICD-10-CM) - Below-knee amputation of right lower extremity   THERAPY DIAG:  Unsteadiness on feet  Other abnormalities of gait and mobility  Muscle weakness (generalized)  Non-pressure chronic ulcer of skin of other sites with other specified severity (HCC)  Rationale for Evaluation and Treatment: Rehabilitation  SUBJECTIVE:   SUBJECTIVE STATEMENT: She does not verbalize or express as much pain with standing and walking today.   PERTINENT HISTORY: CVA / left hemiparesis due to right basal ganglia infarct 7/22, right hip bursitis, anxiety, dCHF, ESRD on HD, HTN, DM, CAD  PAIN:  Are you having pain?  Yes: NPRS scale: reports no pain in knee or back sitting, but transition with standing 9/10 at times Pain location: Rt knee, back Pain description: throbbing Aggravating factors: standing, walking Relieving factors: Voltarin & massage  PRECAUTIONS: Fall and Other: NO BP RUE  WEIGHT BEARING RESTRICTIONS: No  FALLS: Has patient fallen in last 6 months? No  LIVING ENVIRONMENT: Lives with: lives with their spouse and CNAs 24 hrs 7 days / wk Lives in: House Home Access: Ramped entrance Home layout: Multi level, Full bath  on main level, and Able to live on main level with bedroom and bathroom Stairs: Yes: Internal: 16 steps; on left going up and External: 4 steps; on right going up Has following equipment at home: Single point cane, Walker - 2 wheeled, Wheelchair (manual), and shower chair  PLOF: Independent with household mobility with device and Independent with community mobility with device  PATIENT GOALS: walk with prosthesis, stand & transfer.   OBJECTIVE:  COGNITION: Overall cognitive status: History of cognitive impairments - at baseline  POSTURE: rounded shoulders, forward head, decreased lumbar lordosis, increased thoracic kyphosis, and flexed trunk   LOWER EXTREMITY ROM: Eval / 01/05/2023:    BLEs PROM WFL except unable to assess hip ext  (Blank rows = not tested)  LOWER EXTREMITY MMT:  MMT Right eval Right 03/06/23  Hip flexion 3-/5 3-/5  Hip extension 3-/5 3-/5  Hip abduction 3-/5 3-/5  Hip adduction    Hip internal rotation    Hip external rotation    Knee flexion 3-/5 3-/5  Knee extension 3-/5 3-/5  Ankle dorsiflexion NA NA  Ankle plantarflexion NA NA  Ankle inversion NA NA  Ankle eversion NA NA  03/06/2023: LLE hemiparesis but able to advance during gait without assistance.   Eval / 01/05/2023: left LE & UE hemiparesis.  In gait slides to advance LLE and knee / hip instability in stance.  (Blank rows = not tested)  TRANSFERS: 06/13/2023: Sit to stand w/c & 18" chair with armrests to RW with modA. Stand to sit with minA. Stand-pivot transfer with RW with minA once pt standing.   05/09/23 Sit to stand w/c & 18" chair with armrests to RW with modA. Stand to sit with minA. Stand-pivot  transfer with RW with modA / constant cueing. Stand-pivot transfer holding onto PT mod A  03/06/2023:  Sit to stand w/c & 18" chair with armrests to RW with modA. Stand to sit with minA. Stand-pivot  transfer with RW with modA / constant cueing.  02/07/2023: Sit to stand w/c or 18" chair with  armrests to RW with modA. Stand to sit with minA. Both require constant cues. Stand-pivot transfer with RW with modA / constant cueing. (2nd person for safety)  01/05/2023 / Eval: Sit to stand: Max A pulling BUEs on //bars from w/c Stand to sit: Min A using BUEs //bars to control descent Lateral scoot transfers: Mod A w/c to / from mat table level & adjacent   FUNCTIONAL TESTs:  06/13/2023: PT manually cues weight shift over feet with modA initially then pt able to stand 2 min with RW support with close supervision.   03/06/2023: static stance with RW support 60 sec with minA.  Eval / 01/05/2023:  Standing in //bars for 30 sec with modA.   GAIT: 06/13/2023: Pt amb 25' with RW with minA once pt standing.  Pt required w/c following so she can sit immediately upon fatiguing.   03/06/2023: Pt amb 5' with RW with +2 modA including 90* turn to chair.  02/07/2023: Pt amb 5' with RW with +2 modA including 90* turn to chair.   01/05/2023 / Eval: Gait pattern: step to pattern, decreased step length- Right, decreased step length- Left, decreased stance time- Right, decreased stance time- Left, decreased hip/knee flexion- Left, decreased ankle dorsiflexion- Left, Right hip hike, Left hip hike, knee flexed in stance- Left, antalgic, lateral hip instability, trunk flexed, wide BOS, and poor foot clearance- Left Distance walked: 3' Assistive device utilized: // bars & RLE TTA prosthesis Level of assistance: Total A / 2 person Comments:  LLE with poor clearance / slides foot forward and knee / hip instability in stance.   CURRENT PROSTHETIC WEAR ASSESSMENT: 06/13/2023: pt & husband report pt wearing prosthesis all awake hours except during dialysis and ~ 1 hour when her limb starts to throb.  Pt & husband report proper limb care, don/doff and cleaning prosthesis. They require intermittent PT instruction for adjusting ply socks and weight bearing.   03/06/2023: Patient's husband & CNA correctly don & doff  her prosthesis. Pt is dependent requiring maxA to don or doff.  She reports wearing prosthesis 5 hrs 2x/day for 10 hours total on non-dialysis days and up to 8 hours on dialysis days.  Her limb has scab on lateral incision that appears to be internal suture working way out. No signs of infection. Her residual limb appearance is dry skin, normal temperature, limited to no hair growth, cylindrical shape.   Eval / 01/05/2023:  Patient is dependent with: skin check, residual limb care, care of non-amputated limb, prosthetic cleaning, ply sock cleaning, correct ply sock adjustment, proper wear schedule/adjustment, and proper weight-bearing schedule/adjustment Donning prosthesis: Total A / family & CNA requires 100% cueing Doffing prosthesis: Total A / family & CNA requires 100% cueing Prosthetic wear tolerance: 1-1.5 hours, 1x/day, 6 of 7 days since delivery Prosthetic weight bearing tolerance: 3 minutes during eval with c/o knee pain Edema: pitting Residual limb condition: lateral incision with 3mm X 6mm long scabs with serous drainage, arrived with bandaid over wound,  dry skin, normal temperature & color, cylindrical shape Prosthetic description: silicon liner with pin lock suspension, total contact socket, SACH foot K code/activity level with prosthetic use: Level 1    TODAY'S TREATMENT:  DATE: 06/22/2023: Prosthetic Training with TTA prosthesis Sit to stand with RW and patient pushing off wheelchair with min to mod assist, requires cues for set up, hand placement, and sequence, X 5 reps total Patient ambulated 55 feet max tolerated distance (Spo2 99% and HR 69 after), 40 feet, and 55 feet all with rolling walker and min A  Stand pivot transfer with mod A to and from wheelchair to leg press and back in preparation/simulation for car transfer  Therapeutic Exercise Nu step L5  X 10 min UE/LE for functional endurance Leg press DL 16# 1W96, then SL 04# 5W09 each side  06/20/2023: Prosthetic Training with TTA prosthesis Sit to stand with RW and patient pushing off wheelchair with min to mod assist, requires cues for set up, hand placement, and sequence, X 5 reps total Patient ambulated 55 feet max tolerated distance (Spo2 99% and HR 69 after), 50 feet, and 40 feet all with rolling walker and min A with 180 deg turn to Rt and left to sit in chair Stand pivot transfer with mod A to and from wheelchair to leg press and back in preparation/simulation for car transfer Car transfer stand pivot transfer from wheelchair to car seat and back with PT mod A, car transfer with RW from wheelchair to car and back with mod A with PT. Then had CNA try this with her using RW but not able to complete due to patient not being able to back up close enough to car seat so PT took over, then had CNA transfer her back from car seat to wheelchair with PT supervision  Therapeutic Exercise Nu step L5 X 8 min UE/LE for functional endurance Leg press DL 81# 1B14, then SL 78# 2N56 each side  06/16/2023: Prosthetic Training with TTA prosthesis Sit to stand with RW and patient pushing off wheelchair with min to mod assist, requires cues for set up, hand placement, and sequence, X 5 reps total Patient ambulated 55 feet max tolerated distance (Spo2 99% and HR 69 after), 50 feet, and 40 feet all with rolling walker and min A with 180 deg turn to Rt and left to sit in chair  Therapeutic Exercise Nu step L5 X 8 min UE/LE for functional endurance  06/13/2023: Prosthetic Training with TTA prosthesis: See objective data for transfers, standing balance, gait and prosthetic care/wear.   HOME EXERCISE PROGRAM:  ASSESSMENT:  CLINICAL IMPRESSION:She was able to asssit more with sit to stand transfers today and stand pivot transfers today which will help her complete transfers at home and car transfers more  efficently and less work on caregivers. She also had less overall complaints of back pain during walking but still overall present with prolonged walking combined with complaints or Rt wrist pain using RW.   OBJECTIVE IMPAIRMENTS: Abnormal gait, decreased activity tolerance, decreased balance, decreased cognition, decreased coordination, decreased endurance, decreased knowledge of condition, decreased knowledge of use of DME, decreased mobility, difficulty walking, decreased ROM, decreased strength, postural dysfunction, prosthetic dependency , obesity, and pain.   ACTIVITY LIMITATIONS: standing, stairs, transfers, locomotion level, and safe prosthesis use.  PARTICIPATION LIMITATIONS: community activity, church, and household mobility  PERSONAL FACTORS: Age, Fitness, Past/current experiences, Time since onset of injury/illness/exacerbation, and 3+ comorbidities: see PMH  are also affecting patient's functional outcome.   REHAB POTENTIAL: Good  CLINICAL DECISION MAKING: Evolving/moderate complexity  EVALUATION COMPLEXITY: Moderate   GOALS: Goals reviewed with patient? Yes  SHORT TERM GOALS: Target date: 07/14/2023  Patient reports transferring out of w/c >/=  2x/day on non-dialysis days with CNA or family assistance.  Baseline: SEE OBJECTIVE DATA Goal status: set 06/13/2023 2.  Patient reports knee pain <4/10.  Baseline: SEE OBJECTIVE DATA Goal status:  ongoing 06/13/2023  3.  Patient sit to/from stand modA. Baseline: SEE OBJECTIVE DATA Goal status: ongoing 06/13/2023  4. Patient ambulates 31' between 2 chairs (without w/c following pt) with minA. Baseline: SEE OBJECTIVE DATA Goal status: ongoing 06/13/2023    LONG TERM GOALS: Target date: 09/07/2023  Patient & family / CNA demonstrates & verbalized understanding of prosthetic care to enable safe utilization of prosthesis. Baseline: SEE OBJECTIVE DATA Goal status: progressing but not met ongoing 06/13/2023  Patient tolerates  prosthesis wear >90% of awake hours without skin or limb pain issues and knee pain </= 4/10.  Baseline: SEE OBJECTIVE DATA Goal status:  MET 06/13/23 except inconsistent with knee pain  Stand balance with walker: static 2 min with supervision to enable ability for standing ADLS. Baseline: SEE OBJECTIVE DATA Goal status: partially met 06/13/2023  Patient ambulates >50' with RW & prosthesis only with family / CNA assist safely.  Baseline: SEE OBJECTIVE DATA Goal status: progressing but not met ongoing 06/13/2023  Patient transfers stand pivot including sit to/from stand with RW with minA or less.  Baseline: SEE OBJECTIVE DATA Goal status:  progressing but not met ongoing 06/13/2023   PLAN:  PT FREQUENCY: 1-2x/week  PT DURATION: 12 weeks / 90 days  PLANNED INTERVENTIONS: Therapeutic exercises, Therapeutic activity, Neuromuscular re-education, Balance training, Gait training, Patient/Family education, Self Care, Vestibular training, Prosthetic training, DME instructions, and Manual therapy  PLAN FOR NEXT SESSION:  Continue Work on patient walking between 2 chairs and decreasing need for wheelchair to be followed behind patient.  Continue gait and transfers including stand pivot with rolling walker, leg strength, balance, if pt's husband can bring their car to PT, work on car transfers both stand pivot with RW & scooting with sliding board.    Referring diagnosis? O67.124P (ICD-10-CM) - Below-knee amputation of right lower extremity  Treatment diagnosis? (if different than referring diagnosis)  Unsteadiness on feet R26.81   Other abnormalities of gait and mobility R26.89   Muscle weakness (generalized) M62.81   Hemiplegia and hemiparesis following cerebral infarction affecting left non-dominant side (HCC) I69.354   Non-pressure chronic ulcer of skin of other sites with other specified severity (HCC) L98.498   Chronic pain of both knees M25.561, M25.562, G89.29   What was this  (referring dx) caused by? [x]  Surgery []  Fall []  Ongoing issue []  Arthritis []  Other: ____________   Laterality: [x]  Rt []  Lt []  Both   Check all possible CPT codes:                   *CHOOSE 10 OR LESS*                                [x]  97110 (Therapeutic Exercise)                   []  92507 (SLP Treatment)      [x]  97112 (Neuro Re-ed)                                []  92526 (Swallowing Treatment)                  [x]  97116 (Gait Training)                                []   40102 (Cognitive Training, 1st 15 minutes) []  97140 (Manual Therapy)                           []  97130 (Cognitive Training, each add'l 15 minutes)         [x]  97164 (Re-evaluation)                              []  Other, List CPT Code ____________  [x]  97530 (Therapeutic Activities)                                            [x]  97535 (Self Care)               []  All codes above (97110 - 97535)             []  97012 (Mechanical Traction)             []  97014 (E-stim Unattended)             []  97032 (E-stim manual)             []  72536 (Ionto)             []  97035 (Ultrasound) []  97750 (Physical Performance Training) []  [id number] (Aquatic Therapy) []  97016 (Vasopneumatic Device) []  [id number] (Paraffin) []  97034 (Contrast Bath) []  97597 (Wound Care 1st 20 sq cm) []  97598 (Wound Care each add'l 20 sq cm) []  97760 (Orthotic Fabrication, Fitting, Training Initial) [x]  [id number] (Prosthetic Management and Training Initial) [x]  [id number] (Orthotic or Prosthetic Training/ Modification Subsequent)  April Manson, PT, DPT 06/22/2023, 4:40 PM

## 2023-06-27 ENCOUNTER — Ambulatory Visit: Payer: Medicare PPO | Admitting: Physical Therapy

## 2023-06-27 ENCOUNTER — Encounter: Payer: Self-pay | Admitting: Physical Therapy

## 2023-06-27 DIAGNOSIS — L98498 Non-pressure chronic ulcer of skin of other sites with other specified severity: Secondary | ICD-10-CM

## 2023-06-27 DIAGNOSIS — M25561 Pain in right knee: Secondary | ICD-10-CM

## 2023-06-27 DIAGNOSIS — M25562 Pain in left knee: Secondary | ICD-10-CM

## 2023-06-27 DIAGNOSIS — R2689 Other abnormalities of gait and mobility: Secondary | ICD-10-CM

## 2023-06-27 DIAGNOSIS — M6281 Muscle weakness (generalized): Secondary | ICD-10-CM

## 2023-06-27 DIAGNOSIS — I69354 Hemiplegia and hemiparesis following cerebral infarction affecting left non-dominant side: Secondary | ICD-10-CM

## 2023-06-27 DIAGNOSIS — R2681 Unsteadiness on feet: Secondary | ICD-10-CM | POA: Diagnosis not present

## 2023-06-27 DIAGNOSIS — G8929 Other chronic pain: Secondary | ICD-10-CM

## 2023-06-27 NOTE — Therapy (Signed)
OUTPATIENT PHYSICAL THERAPY TREATMENT   Patient Name: Carolyn Russo MRN: [id number] DOB:06/02/1951, 72 y.o., female Today's Date: 06/27/2023  PCP: No PCP on file REFERRING PROVIDER: Aldean Baker, MD     END OF SESSION:  PT End of Session - 06/27/23 1507     Visit Number 30    Number of Visits 46    Date for PT Re-Evaluation 09/07/23    Authorization Type [employer] PPO    Authorization Time Period $20 COPAY, Approved 12 PT VISITS  Authorization #[id number]  Tracking #[id number] 8/20-11/14    Authorization - Visit Number 5    Authorization - Number of Visits 12    Progress Note Due on Visit 36    PT Start Time 1510    PT Stop Time 1559    PT Time Calculation (min) 49 min    Equipment Utilized During Treatment Gait belt    Activity Tolerance Patient limited by fatigue    Behavior During Therapy WFL for tasks assessed/performed                   Past Medical History:  Diagnosis Date   Acute GI bleeding    Allergy    Anemia    Anterior chest wall pain    Appendicitis 1965   Asthma    Body mass index 37.0-37.9, adult    Breast pain    Cataract    both eyes   CHF (congestive heart failure) (HCC)    Chronic kidney disease    stage 5 - on dialysis   Cognitive change 04/20/2021   r/t cva 03/2821   Complication of anesthesia    Memory loss after general   Dehydration 2014   Deviated septum 1971   Diabetes mellitus    Dysphagia due to old stroke    easy to get strangled when eating   Dyspnea 2014   Extrinsic asthma    WITH ASTHMA ATTACK   Fibroid 1980   GERD (gastroesophageal reflux disease)    Heart murmur    History of migraine    History of seizure    with stroke   Hx gestational diabetes    Hyperlipidemia    Hypertension 2014   Inguinal hernia 1959   Malaise and fatigue 2014   Non-IgE mediated allergic asthma 2014   Obesity    Pelvic pain    Pregnancy, high-risk 1985   Stroke (HCC) 04/20/2021   (CVA) of right basal ganglia    Tonsillitis 1968   Uterine fibroid 1980   Visual field defect    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   Past Surgical History:  Procedure Laterality Date   ABDOMINAL AORTOGRAM W/LOWER EXTREMITY N/A 02/21/2022   Procedure: ABDOMINAL AORTOGRAM W/LOWER EXTREMITY;  Surgeon: Maeola Harman, MD;  Location: [facility] INVASIVE CV LAB;  Service: Cardiovascular;  Laterality: N/A;   AMPUTATION Right 05/18/2022   Procedure: RIGHT BELOW KNEE AMPUTATION;  Surgeon: Nadara Mustard, MD;  Location: [facility] OR;  Service: Orthopedics;  Laterality: Right;   AMPUTATION TOE Right 04/15/2022   Procedure: AMPUTATION  LST TOERIGHT FOOT;  Surgeon: Edwin Cap, DPM;  Location: [facility] ORS;  Service: Podiatry;  Laterality: Right;   APPENDECTOMY  1959   BASCILIC VEIN TRANSPOSITION Right 04/30/2021   Procedure: RIGHT FIRST STAGE BASCILIC VEIN TRANSPOSITION;  Surgeon: Chuck Hint, MD;  Location: [facility] OR;  Service: Vascular;  Laterality: Right;   CESAREAN SECTION  1985   COLONOSCOPY     ESOPHAGOGASTRODUODENOSCOPY (EGD) WITH  PROPOFOL N/A 04/18/2021   Procedure: ESOPHAGOGASTRODUODENOSCOPY (EGD) WITH PROPOFOL;  Surgeon: Iva Boop, MD;  Location[facility] ENDOSCOPY;  Service: Endoscopy;  Laterality: N/A;   EYE SURGERY     bilateral cataract    FLEXIBLE SIGMOIDOSCOPY N/A 04/18/2021   Procedure: FLEXIBLE SIGMOIDOSCOPY;  Surgeon: Iva Boop, MD;  Location: [facility] ENDOSCOPY;  Service: Endoscopy;  Laterality: N/A;   HERNIA REPAIR  1959   IR FLUORO GUIDE CV LINE RIGHT  03/18/2021   IR US GUIDE VASC ACCESS RIGHT  03/18/2021   LEFT HEART CATH AND CORONARY ANGIOGRAPHY N/A 04/04/2017   Procedure: Left Heart Cath and Coronary Angiography;  Surgeon: Lyn Records, MD;  Location: [facility] INVASIVE CV LAB;  Service: Cardiovascular;  Laterality: N/A;   MYOMECTOMY  1980, 2004, 2007   PERIPHERAL VASCULAR THROMBECTOMY  02/21/2022   Procedure: PERIPHERAL VASCULAR THROMBECTOMY;  Surgeon: Maeola Harman, MD;  Location: [facility] INVASIVE CV LAB;  Service:  Cardiovascular;;  Right Tibial   RHINOPLASTY  1971   ROTATOR CUFF REPAIR  2003   SURGICAL REPAIR OF HEMORRHAGE  2015   TONSILLECTOMY  1968   Patient Active Problem List   Diagnosis Date Noted   Wheelchair dependence 06/09/2023   Hx of right BKA (HCC) 03/10/2023   CHF (congestive heart failure) (HCC) 02/10/2023   Dyspnea 02/09/2023   Acute respiratory distress 02/09/2023   Urinary tract infection without hematuria    Diabetes mellitus due to underlying condition with hypoglycemia without coma, with long-term current use of insulin (HCC)    Seizure disorder (HCC) 06/22/2022   Acute respiratory failure (HCC)    Unresponsiveness    Pressure ulcer 06/07/2022   Acute stroke due to ischemia (HCC) 06/03/2022   Chest pain 06/01/2022   History of CVA (cerebrovascular accident) 06/01/2022   Seizure (HCC)    Encephalopathy    Below-knee amputation of right lower extremity (HCC)    Leukocytosis    Foot infection 05/10/2022   PAD (peripheral artery disease) (HCC) 05/10/2022   Gangrene of right foot (HCC) 04/15/2022   Left hemiparesis (HCC) 09/24/2021   Abnormality of gait 09/24/2021   Pain of right hand 08/06/2021   Steal syndrome of dialysis vascular access (HCC) 08/06/2021   Subclavian steal syndrome of right subclavian artery 06/25/2021   Right hand weakness 06/25/2021   Stroke-like symptoms 05/06/2021   Bandemia    Cerebrovascular accident (CVA) of right basal ganglia (HCC) 04/20/2021   Hemorrhoids    Basal ganglia infarction (HCC) 04/16/2021   ESRD on dialysis (HCC) 04/16/2021   Hypertensive urgency 03/10/2021   Hilar enlargement    Anemia of chronic disease    Chronic diastolic CHF (congestive heart failure) (HCC) 03/09/2021   CKD (chronic kidney disease), stage V (HCC) 03/08/2021   Diabetic retinopathy (HCC) 03/08/2021   Hyperglycemia due to type 2 diabetes mellitus (HCC) 03/08/2021   Pure hypercholesterolemia 03/08/2021   Anemia in chronic kidney disease 09/24/2020    Uncontrolled type 2 diabetes mellitus with hyperglycemia (HCC) 03/19/2019   Osteopenia 09/04/2018   Migraine 09/04/2018   Asthma 09/04/2018   Pseudophakia of both eyes 07/17/2018   Pseudophakia of left eye 07/03/2018   Open angle with borderline findings and high glaucoma risk in left eye 07/03/2018   Age-related nuclear cataract of right eye 07/03/2018   CAD (coronary artery disease), native coronary artery 04/27/2017   Abnormal nuclear stress test 04/04/2017   Shortness of breath 03/09/2017   Appendicitis    Age-related hypermature cataract of both eyes 12/20/2016   Uterine  leiomyoma 11/27/2012   Dyslipidemia (high LDL; low HDL) 11/25/2011   Obesity (BMI 30-39.9) 11/25/2011   Hypertensive disorder 11/21/2011   Type 2 diabetes mellitus (HCC) 11/21/2011   Abnormal EKG 11/21/2011    ONSET DATE: 12/29/2022 prosthesis delivery  REFERRING DIAG: U98.119J (ICD-10-CM) - Below-knee amputation of right lower extremity   THERAPY DIAG:  Unsteadiness on feet  Other abnormalities of gait and mobility  Muscle weakness (generalized)  Non-pressure chronic ulcer of skin of other sites with other specified severity (HCC)  Hemiplegia and hemiparesis following cerebral infarction affecting left non-dominant side (HCC)  Chronic pain of both knees  Rationale for Evaluation and Treatment: Rehabilitation  SUBJECTIVE:   SUBJECTIVE STATEMENT:   PERTINENT HISTORY: CVA / left hemiparesis due to right basal ganglia infarct 7/22, right hip bursitis, anxiety, dCHF, ESRD on HD, HTN, DM, CAD  PAIN:  Are you having pain?  Yes: NPRS scale: reports no pain in knee or back sitting, but transition with standing  9/10 at times Pain location: Rt knee, back Pain description: throbbing Aggravating factors: standing, walking Relieving factors: Voltarin & massage  PRECAUTIONS: Fall and Other: NO BP RUE  WEIGHT BEARING RESTRICTIONS: No  FALLS: Has patient fallen in last 6 months? No  LIVING  ENVIRONMENT: Lives with: lives with their spouse and CNAs 24 hrs 7 days / wk Lives in: House Home Access: Ramped entrance Home layout: Multi level, Full bath on main level, and Able to live on main level with bedroom and bathroom Stairs: Yes: Internal: 16 steps; on left going up and External: 4 steps; on right going up Has following equipment at home: Single point cane, Walker - 2 wheeled, Wheelchair (manual), and shower chair  PLOF: Independent with household mobility with device and Independent with community mobility with device  PATIENT GOALS: walk with prosthesis, stand & transfer.   OBJECTIVE:  COGNITION: Overall cognitive status: History of cognitive impairments - at baseline  POSTURE: rounded shoulders, forward head, decreased lumbar lordosis, increased thoracic kyphosis, and flexed trunk   LOWER EXTREMITY ROM: Eval / 01/05/2023:    BLEs PROM WFL except unable to assess hip ext  (Blank rows = not tested)  LOWER EXTREMITY MMT:  MMT Right eval Right 03/06/23  Hip flexion 3-/5 3-/5  Hip extension 3-/5 3-/5  Hip abduction 3-/5 3-/5  Hip adduction    Hip internal rotation    Hip external rotation    Knee flexion 3-/5 3-/5  Knee extension 3-/5 3-/5  Ankle dorsiflexion NA NA  Ankle plantarflexion NA NA  Ankle inversion NA NA  Ankle eversion NA NA  03/06/2023: LLE hemiparesis but able to advance during gait without assistance.   Eval / 01/05/2023: left LE & UE hemiparesis.  In gait slides to advance LLE and knee / hip instability in stance.  (Blank rows = not tested)  TRANSFERS: 06/13/2023: Sit to stand w/c & 18" chair with armrests to RW with modA. Stand to sit with minA. Stand-pivot transfer with RW with minA once pt standing.   05/09/23 Sit to stand w/c & 18" chair with armrests to RW with modA. Stand to sit with minA. Stand-pivot  transfer with RW with modA / constant cueing. Stand-pivot transfer holding onto PT mod A  03/06/2023:  Sit to stand w/c & 18" chair with  armrests to RW with modA. Stand to sit with minA. Stand-pivot  transfer with RW with modA / constant cueing.  02/07/2023: Sit to stand w/c or 18" chair with armrests to RW with modA. Stand  to sit with minA. Both require constant cues. Stand-pivot transfer with RW with modA / constant cueing. (2nd person for safety)   01/05/2023 / Eval: Sit to stand: Max A pulling BUEs on //bars from w/c Stand to sit: Min A using BUEs //bars to control descent Lateral scoot transfers: Mod A w/c to / from mat table level & adjacent   FUNCTIONAL TESTs:  06/13/2023: PT manually cues weight shift over feet with modA initially then pt able to stand 2 min with RW support with close supervision.   03/06/2023: static stance with RW support 60 sec with minA.  Eval / 01/05/2023:  Standing in //bars for 30 sec with modA.   GAIT: 06/13/2023: Pt amb 25' with RW with minA once pt standing.  Pt required w/c following so she can sit immediately upon fatiguing.   03/06/2023: Pt amb 5' with RW with +2 modA including 90* turn to chair.  02/07/2023: Pt amb 5' with RW with +2 modA including 90* turn to chair.   01/05/2023 / Eval: Gait pattern: step to pattern, decreased step length- Right, decreased step length- Left, decreased stance time- Right, decreased stance time- Left, decreased hip/knee flexion- Left, decreased ankle dorsiflexion- Left, Right hip hike, Left hip hike, knee flexed in stance- Left, antalgic, lateral hip instability, trunk flexed, wide BOS, and poor foot clearance- Left Distance walked: 3' Assistive device utilized: // bars & RLE TTA prosthesis Level of assistance: Total A / 2 person Comments:  LLE with poor clearance / slides foot forward and knee / hip instability in stance.   CURRENT PROSTHETIC WEAR ASSESSMENT: 06/13/2023: pt & husband report pt wearing prosthesis all awake hours except during dialysis and ~ 1 hour when her limb starts to throb.  Pt & husband report proper limb care, don/doff and  cleaning prosthesis. They require intermittent PT instruction for adjusting ply socks and weight bearing.   03/06/2023: Patient's husband & CNA correctly don & doff her prosthesis. Pt is dependent requiring maxA to don or doff.  She reports wearing prosthesis 5 hrs 2x/day for 10 hours total on non-dialysis days and up to 8 hours on dialysis days.  Her limb has scab on lateral incision that appears to be internal suture working way out. No signs of infection. Her residual limb appearance is dry skin, normal temperature, limited to no hair growth, cylindrical shape.   Eval / 01/05/2023:  Patient is dependent with: skin check, residual limb care, care of non-amputated limb, prosthetic cleaning, ply sock cleaning, correct ply sock adjustment, proper wear schedule/adjustment, and proper weight-bearing schedule/adjustment Donning prosthesis: Total A / family & CNA requires 100% cueing Doffing prosthesis: Total A / family & CNA requires 100% cueing Prosthetic wear tolerance: 1-1.5 hours, 1x/day, 6 of 7 days since delivery Prosthetic weight bearing tolerance: 3 minutes during eval with c/o knee pain Edema: pitting Residual limb condition: lateral incision with 3mm X 6mm long scabs with serous drainage, arrived with bandaid over wound,  dry skin, normal temperature & color, cylindrical shape Prosthetic description: silicon liner with pin lock suspension, total contact socket, SACH foot K code/activity level with prosthetic use: Level 1    TODAY'S TREATMENT:  DATE: 06/27/2023: Prosthetic Training with TTA prosthesis PT removed suture & loose scab with tweezers from residual limb.  No open areas so PT recommended discontinuing use of under liner of Vivewear.  Sit to stand with RW and patient pushing off wheelchair with min to mod assist, requires cues for set up, hand placement, and  sequence, X 5 reps total. Patient ambulated 19' and 25' with RW with minA first 2 steps then CGA. No w/c following.  Stand pivot transfer Nustep to w/c with RW with CGA once pt standing.  Cues on fully turning feet to square up with chair prior to sitting.   Therapeutic Exercise Nu step seat 10 L5 X 10 min UE/LE for functional endurance Leg press DL 95# [id number], then SL 08# [id number] each side Squat over chair with BUE on RW that PT stabilized 5 reps.  PT recommended to husband & pt to start going to [facility] 1x/wk on Sat or Sunday to get used to using leg press and recumbent stepper there. So when we discharge from PT then she can continue with exercises. Pt verbalized understanding.    06/22/2023: Prosthetic Training with TTA prosthesis Sit to stand with RW and patient pushing off wheelchair with min to mod assist, requires cues for set up, hand placement, and sequence, X 5 reps total Patient ambulated 55 feet max tolerated distance (Spo2 99% and HR 69 after), 40 feet, and 55 feet all with rolling walker and min A  Stand pivot transfer with mod A to and from wheelchair to leg press and back in preparation/simulation for car transfer  Therapeutic Exercise Nu step L5 X 10 min UE/LE for functional endurance Leg press DL 46# [id number], then SL 52# 8U13 each side  06/20/2023: Prosthetic Training with TTA prosthesis Sit to stand with RW and patient pushing off wheelchair with min to mod assist, requires cues for set up, hand placement, and sequence, X 5 reps total Patient ambulated 55 feet max tolerated distance (Spo2 99% and HR 69 after), 50 feet, and 40 feet all with rolling walker and min A with 180 deg turn to Rt and left to sit in chair Stand pivot transfer with mod A to and from wheelchair to leg press and back in preparation/simulation for car transfer Car transfer stand pivot transfer from wheelchair to car seat and back with PT mod A, car transfer with RW from wheelchair to car and back with mod A with  PT. Then had CNA try this with her using RW but not able to complete due to patient not being able to back up close enough to car seat so PT took over, then had CNA transfer her back from car seat to wheelchair with PT supervision  Therapeutic Exercise Nu step L5 X 8 min UE/LE for functional endurance Leg press DL 24# 4W10, then SL 27# 2Z36 each side     HOME EXERCISE PROGRAM:  ASSESSMENT:  CLINICAL IMPRESSION:   patient continues to need assistance for sit to stand but requires less assistance for gait & transfers once she is upright with RW support.  PT is recommending pt to begin to go to [facility] to get comfortable with exercising there once PT discharges.    OBJECTIVE IMPAIRMENTS: Abnormal gait, decreased activity tolerance, decreased balance, decreased cognition, decreased coordination, decreased endurance, decreased knowledge of condition, decreased knowledge of use of DME, decreased mobility, difficulty walking, decreased ROM, decreased strength, postural dysfunction, prosthetic dependency , obesity, and pain.   ACTIVITY LIMITATIONS: standing, stairs, transfers, locomotion  level, and safe prosthesis use.  PARTICIPATION LIMITATIONS: community activity, church, and household mobility  PERSONAL FACTORS: Age, Fitness, Past/current experiences, Time since onset of injury/illness/exacerbation, and 3+ comorbidities: see PMH  are also affecting patient's functional outcome.   REHAB POTENTIAL: Good  CLINICAL DECISION MAKING: Evolving/moderate complexity  EVALUATION COMPLEXITY: Moderate   GOALS: Goals reviewed with patient? Yes  SHORT TERM GOALS: Target date: 07/14/2023  Patient reports transferring out of w/c >/= 2x/day on non-dialysis days with CNA or family assistance.  Baseline: SEE OBJECTIVE DATA Goal status: Ongoing 06/27/2023 2.  Patient reports knee pain <4/10.  Baseline: SEE OBJECTIVE DATA Goal status:  Ongoing 06/27/2023  3.  Patient sit to/from stand modA. Baseline: SEE  OBJECTIVE DATA Goal status: Ongoing 06/27/2023  4. Patient ambulates 20' between 2 chairs (without w/c following pt) with minA. Baseline: SEE OBJECTIVE DATA Goal status: Ongoing 06/27/2023    LONG TERM GOALS: Target date: 09/07/2023  Patient & family / CNA demonstrates & verbalized understanding of prosthetic care to enable safe utilization of prosthesis. Baseline: SEE OBJECTIVE DATA Goal status: Ongoing 06/27/2023  Patient tolerates prosthesis wear >90% of awake hours without skin or limb pain issues and knee pain </= 4/10.  Baseline: SEE OBJECTIVE DATA Goal status:  Ongoing 06/27/2023  Stand balance with walker: static 2 min with supervision to enable ability for standing ADLS. Baseline: SEE OBJECTIVE DATA Goal status: Ongoing 06/27/2023  Patient ambulates >50' with RW & prosthesis only with family / CNA assist safely.  Baseline: SEE OBJECTIVE DATA Goal status: Ongoing 06/27/2023  Patient transfers stand pivot including sit to/from stand with RW with minA or less.  Baseline: SEE OBJECTIVE DATA Goal status:  Ongoing 06/27/2023   PLAN:  PT FREQUENCY: 1-2x/week  PT DURATION: 12 weeks / 90 days  PLANNED INTERVENTIONS: Therapeutic exercises, Therapeutic activity, Neuromuscular re-education, Balance training, Gait training, Patient/Family education, Self Care, Vestibular training, Prosthetic training, DME instructions, and Manual therapy  PLAN FOR NEXT SESSION:  check on [employer] recommendation for leg press & Nustep only initially,  Continue Work on patient walking between 2 chairs and decreasing need for wheelchair to be followed behind patient.  Continue gait and transfers including stand pivot with rolling walker, leg strength, balance  Vladimir Faster, PT, DPT 06/27/2023, 5:06 PM

## 2023-07-04 ENCOUNTER — Ambulatory Visit: Payer: Medicare PPO | Admitting: Physical Therapy

## 2023-07-04 ENCOUNTER — Encounter: Payer: Self-pay | Admitting: Physical Therapy

## 2023-07-04 DIAGNOSIS — R2681 Unsteadiness on feet: Secondary | ICD-10-CM

## 2023-07-04 DIAGNOSIS — M6281 Muscle weakness (generalized): Secondary | ICD-10-CM

## 2023-07-04 DIAGNOSIS — R2689 Other abnormalities of gait and mobility: Secondary | ICD-10-CM

## 2023-07-04 DIAGNOSIS — I69354 Hemiplegia and hemiparesis following cerebral infarction affecting left non-dominant side: Secondary | ICD-10-CM | POA: Diagnosis not present

## 2023-07-04 DIAGNOSIS — M25561 Pain in right knee: Secondary | ICD-10-CM

## 2023-07-04 DIAGNOSIS — M25562 Pain in left knee: Secondary | ICD-10-CM

## 2023-07-04 DIAGNOSIS — G8929 Other chronic pain: Secondary | ICD-10-CM

## 2023-07-04 NOTE — Therapy (Signed)
OUTPATIENT PHYSICAL THERAPY TREATMENT   Patient Name: Carolyn Russo MRN: [id number] DOB:1951-06-29, 72 y.o., female Today's Date: 07/04/2023  PCP: No PCP on file REFERRING PROVIDER: Aldean Baker, MD     END OF SESSION:  PT End of Session - 07/04/23 1634     Visit Number 31    Number of Visits 46    Date for PT Re-Evaluation 09/07/23    Authorization Type [employer] PPO    Authorization Time Period $20 COPAY, Approved 12 PT VISITS  Authorization #[id number]  Tracking #[id number] 8/20-11/14    Authorization - Visit Number 6    Authorization - Number of Visits 12    Progress Note Due on Visit 36    PT Start Time 1555    PT Stop Time 1640    PT Time Calculation (min) 45 min    Equipment Utilized During Treatment Gait belt    Activity Tolerance Patient limited by fatigue;Patient tolerated treatment well    Behavior During Therapy WFL for tasks assessed/performed                   Past Medical History:  Diagnosis Date   Acute GI bleeding    Allergy    Anemia    Anterior chest wall pain    Appendicitis 1965   Asthma    Body mass index 37.0-37.9, adult    Breast pain    Cataract    both eyes   CHF (congestive heart failure) (HCC)    Chronic kidney disease    stage 5 - on dialysis   Cognitive change 04/20/2021   r/t cva 03/2821   Complication of anesthesia    Memory loss after general   Dehydration 2014   Deviated septum 1971   Diabetes mellitus    Dysphagia due to old stroke    easy to get strangled when eating   Dyspnea 2014   Extrinsic asthma    WITH ASTHMA ATTACK   Fibroid 1980   GERD (gastroesophageal reflux disease)    Heart murmur    History of migraine    History of seizure    with stroke   Hx gestational diabetes    Hyperlipidemia    Hypertension 2014   Inguinal hernia 1959   Malaise and fatigue 2014   Non-IgE mediated allergic asthma 2014   Obesity    Pelvic pain    Pregnancy, high-risk 1985   Stroke (HCC) 04/20/2021    (CVA) of right basal ganglia   Tonsillitis 1968   Uterine fibroid 1980   Visual field defect    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   Past Surgical History:  Procedure Laterality Date   ABDOMINAL AORTOGRAM W/LOWER EXTREMITY N/A 02/21/2022   Procedure: ABDOMINAL AORTOGRAM W/LOWER EXTREMITY;  Surgeon: Maeola Harman, MD;  Location: [facility] INVASIVE CV LAB;  Service: Cardiovascular;  Laterality: N/A;   AMPUTATION Right 05/18/2022   Procedure: RIGHT BELOW KNEE AMPUTATION;  Surgeon: Nadara Mustard, MD;  Location: [facility] OR;  Service: Orthopedics;  Laterality: Right;   AMPUTATION TOE Right 04/15/2022   Procedure: AMPUTATION  LST TOERIGHT FOOT;  Surgeon: Edwin Cap, DPM;  Location: [facility] ORS;  Service: Podiatry;  Laterality: Right;   APPENDECTOMY  1959   BASCILIC VEIN TRANSPOSITION Right 04/30/2021   Procedure: RIGHT FIRST STAGE BASCILIC VEIN TRANSPOSITION;  Surgeon: Chuck Hint, MD;  Location: [facility] OR;  Service: Vascular;  Laterality: Right;   CESAREAN SECTION  1985   COLONOSCOPY  ESOPHAGOGASTRODUODENOSCOPY (EGD) WITH PROPOFOL N/A 04/18/2021   Procedure: ESOPHAGOGASTRODUODENOSCOPY (EGD) WITH PROPOFOL;  Surgeon: Iva Boop, MD;  Location: [facility] ENDOSCOPY;  Service: Endoscopy;  Laterality: N/A;   EYE SURGERY     bilateral cataract    FLEXIBLE SIGMOIDOSCOPY N/A 04/18/2021   Procedure: FLEXIBLE SIGMOIDOSCOPY;  Surgeon: Iva Boop, MD;  Location: [facility] ENDOSCOPY;  Service: Endoscopy;  Laterality: N/A;   HERNIA REPAIR  1959   IR FLUORO GUIDE CV LINE RIGHT  03/18/2021   IR US GUIDE VASC ACCESS RIGHT  03/18/2021   LEFT HEART CATH AND CORONARY ANGIOGRAPHY N/A 04/04/2017   Procedure: Left Heart Cath and Coronary Angiography;  Surgeon: Lyn Records, MD;  Location: [facility] INVASIVE CV LAB;  Service: Cardiovascular;  Laterality: N/A;   MYOMECTOMY  1980, 2004, 2007   PERIPHERAL VASCULAR THROMBECTOMY  02/21/2022   Procedure: PERIPHERAL VASCULAR THROMBECTOMY;  Surgeon: Maeola Harman, MD;  Location: [facility]  INVASIVE CV LAB;  Service: Cardiovascular;;  Right Tibial   RHINOPLASTY  1971   ROTATOR CUFF REPAIR  2003   SURGICAL REPAIR OF HEMORRHAGE  2015   TONSILLECTOMY  1968   Patient Active Problem List   Diagnosis Date Noted   Wheelchair dependence 06/09/2023   Hx of right BKA (HCC) 03/10/2023   CHF (congestive heart failure) (HCC) 02/10/2023   Dyspnea 02/09/2023   Acute respiratory distress 02/09/2023   Urinary tract infection without hematuria    Diabetes mellitus due to underlying condition with hypoglycemia without coma, with long-term current use of insulin (HCC)    Seizure disorder (HCC) 06/22/2022   Acute respiratory failure (HCC)    Unresponsiveness    Pressure ulcer 06/07/2022   Acute stroke due to ischemia (HCC) 06/03/2022   Chest pain 06/01/2022   History of CVA (cerebrovascular accident) 06/01/2022   Seizure (HCC)    Encephalopathy    Below-knee amputation of right lower extremity (HCC)    Leukocytosis    Foot infection 05/10/2022   PAD (peripheral artery disease) (HCC) 05/10/2022   Gangrene of right foot (HCC) 04/15/2022   Left hemiparesis (HCC) 09/24/2021   Abnormality of gait 09/24/2021   Pain of right hand 08/06/2021   Steal syndrome of dialysis vascular access (HCC) 08/06/2021   Subclavian steal syndrome of right subclavian artery 06/25/2021   Right hand weakness 06/25/2021   Stroke-like symptoms 05/06/2021   Bandemia    Cerebrovascular accident (CVA) of right basal ganglia (HCC) 04/20/2021   Hemorrhoids    Basal ganglia infarction (HCC) 04/16/2021   ESRD on dialysis (HCC) 04/16/2021   Hypertensive urgency 03/10/2021   Hilar enlargement    Anemia of chronic disease    Chronic diastolic CHF (congestive heart failure) (HCC) 03/09/2021   CKD (chronic kidney disease), stage V (HCC) 03/08/2021   Diabetic retinopathy (HCC) 03/08/2021   Hyperglycemia due to type 2 diabetes mellitus (HCC) 03/08/2021   Pure hypercholesterolemia 03/08/2021   Anemia in chronic kidney  disease 09/24/2020   Uncontrolled type 2 diabetes mellitus with hyperglycemia (HCC) 03/19/2019   Osteopenia 09/04/2018   Migraine 09/04/2018   Asthma 09/04/2018   Pseudophakia of both eyes 07/17/2018   Pseudophakia of left eye 07/03/2018   Open angle with borderline findings and high glaucoma risk in left eye 07/03/2018   Age-related nuclear cataract of right eye 07/03/2018   CAD (coronary artery disease), native coronary artery 04/27/2017   Abnormal nuclear stress test 04/04/2017   Shortness of breath 03/09/2017   Appendicitis    Age-related hypermature cataract of both eyes 12/20/2016  Uterine leiomyoma 11/27/2012   Dyslipidemia (high LDL; low HDL) 11/25/2011   Obesity (BMI 30-39.9) 11/25/2011   Hypertensive disorder 11/21/2011   Type 2 diabetes mellitus (HCC) 11/21/2011   Abnormal EKG 11/21/2011    ONSET DATE: 12/29/2022 prosthesis delivery  REFERRING DIAG: Z61.096E (ICD-10-CM) - Below-knee amputation of right lower extremity   THERAPY DIAG:  Unsteadiness on feet  Other abnormalities of gait and mobility  Muscle weakness (generalized)  Hemiplegia and hemiparesis following cerebral infarction affecting left non-dominant side (HCC)  Chronic pain of both knees  Rationale for Evaluation and Treatment: Rehabilitation  SUBJECTIVE:   SUBJECTIVE STATEMENT:"She relays some pain in distal residual limb, its not really there in sitting but upon standing it gets bad"   PERTINENT HISTORY: CVA / left hemiparesis due to right basal ganglia infarct 7/22, right hip bursitis, anxiety, dCHF, ESRD on HD, HTN, DM, CAD  PAIN:  Are you having pain?  Yes: NPRS scale: reports no pain in knee or back sitting, but transition with standing  7-9/10 at times Pain location: Rt knee, back Pain description: throbbing Aggravating factors: standing, walking Relieving factors: Voltarin & massage  PRECAUTIONS: Fall and Other: NO BP RUE  WEIGHT BEARING RESTRICTIONS: No  FALLS: Has patient fallen  in last 6 months? No  LIVING ENVIRONMENT: Lives with: lives with their spouse and CNAs 24 hrs 7 days / wk Lives in: House Home Access: Ramped entrance Home layout: Multi level, Full bath on main level, and Able to live on main level with bedroom and bathroom Stairs: Yes: Internal: 16 steps; on left going up and External: 4 steps; on right going up Has following equipment at home: Single point cane, Walker - 2 wheeled, Wheelchair (manual), and shower chair  PLOF: Independent with household mobility with device and Independent with community mobility with device  PATIENT GOALS: walk with prosthesis, stand & transfer.   OBJECTIVE:  COGNITION: Overall cognitive status: History of cognitive impairments - at baseline  POSTURE: rounded shoulders, forward head, decreased lumbar lordosis, increased thoracic kyphosis, and flexed trunk   LOWER EXTREMITY ROM: Eval / 01/05/2023:    BLEs PROM WFL except unable to assess hip ext  (Blank rows = not tested)  LOWER EXTREMITY MMT:  MMT Right eval Right 03/06/23  Hip flexion 3-/5 3-/5  Hip extension 3-/5 3-/5  Hip abduction 3-/5 3-/5  Hip adduction    Hip internal rotation    Hip external rotation    Knee flexion 3-/5 3-/5  Knee extension 3-/5 3-/5  Ankle dorsiflexion NA NA  Ankle plantarflexion NA NA  Ankle inversion NA NA  Ankle eversion NA NA  03/06/2023: LLE hemiparesis but able to advance during gait without assistance.   Eval / 01/05/2023: left LE & UE hemiparesis.  In gait slides to advance LLE and knee / hip instability in stance.  (Blank rows = not tested)  TRANSFERS: 06/13/2023: Sit to stand w/c & 18" chair with armrests to RW with modA. Stand to sit with minA. Stand-pivot transfer with RW with minA once pt standing.   05/09/23 Sit to stand w/c & 18" chair with armrests to RW with modA. Stand to sit with minA. Stand-pivot  transfer with RW with modA / constant cueing. Stand-pivot transfer holding onto PT mod A  03/06/2023:   Sit to stand w/c & 18" chair with armrests to RW with modA. Stand to sit with minA. Stand-pivot  transfer with RW with modA / constant cueing.  02/07/2023: Sit to stand w/c or 18" chair with  armrests to RW with modA. Stand to sit with minA. Both require constant cues. Stand-pivot transfer with RW with modA / constant cueing. (2nd person for safety)   01/05/2023 / Eval: Sit to stand: Max A pulling BUEs on //bars from w/c Stand to sit: Min A using BUEs //bars to control descent Lateral scoot transfers: Mod A w/c to / from mat table level & adjacent   FUNCTIONAL TESTs:  06/13/2023: PT manually cues weight shift over feet with modA initially then pt able to stand 2 min with RW support with close supervision.   03/06/2023: static stance with RW support 60 sec with minA.  Eval / 01/05/2023:  Standing in //bars for 30 sec with modA.   GAIT: 06/13/2023: Pt amb 25' with RW with minA once pt standing.  Pt required w/c following so she can sit immediately upon fatiguing.   03/06/2023: Pt amb 5' with RW with +2 modA including 90* turn to chair.  02/07/2023: Pt amb 5' with RW with +2 modA including 90* turn to chair.   01/05/2023 / Eval: Gait pattern: step to pattern, decreased step length- Right, decreased step length- Left, decreased stance time- Right, decreased stance time- Left, decreased hip/knee flexion- Left, decreased ankle dorsiflexion- Left, Right hip hike, Left hip hike, knee flexed in stance- Left, antalgic, lateral hip instability, trunk flexed, wide BOS, and poor foot clearance- Left Distance walked: 3' Assistive device utilized: // bars & RLE TTA prosthesis Level of assistance: Total A / 2 person Comments:  LLE with poor clearance / slides foot forward and knee / hip instability in stance.   CURRENT PROSTHETIC WEAR ASSESSMENT: 06/13/2023: pt & husband report pt wearing prosthesis all awake hours except during dialysis and ~ 1 hour when her limb starts to throb.  Pt & husband report  proper limb care, don/doff and cleaning prosthesis. They require intermittent PT instruction for adjusting ply socks and weight bearing.   03/06/2023: Patient's husband & CNA correctly don & doff her prosthesis. Pt is dependent requiring maxA to don or doff.  She reports wearing prosthesis 5 hrs 2x/day for 10 hours total on non-dialysis days and up to 8 hours on dialysis days.  Her limb has scab on lateral incision that appears to be internal suture working way out. No signs of infection. Her residual limb appearance is dry skin, normal temperature, limited to no hair growth, cylindrical shape.   Eval / 01/05/2023:  Patient is dependent with: skin check, residual limb care, care of non-amputated limb, prosthetic cleaning, ply sock cleaning, correct ply sock adjustment, proper wear schedule/adjustment, and proper weight-bearing schedule/adjustment Donning prosthesis: Total A / family & CNA requires 100% cueing Doffing prosthesis: Total A / family & CNA requires 100% cueing Prosthetic wear tolerance: 1-1.5 hours, 1x/day, 6 of 7 days since delivery Prosthetic weight bearing tolerance: 3 minutes during eval with c/o knee pain Edema: pitting Residual limb condition: lateral incision with 3mm X 6mm long scabs with serous drainage, arrived with bandaid over wound,  dry skin, normal temperature & color, cylindrical shape Prosthetic description: silicon liner with pin lock suspension, total contact socket, SACH foot K code/activity level with prosthetic use: Level 1    TODAY'S TREATMENT:  DATE: 07/04/2023: Prosthetic Training with TTA prosthesis Sit to stand with RW and patient pushing off wheelchair with min to mod assist, requires cues for set up, hand placement, and sequence, X 5 reps total. Patient ambulated 30 feet X 2 with min A to CGA. No w/c following.  Stand pivot transfer  wheelchair to standard chair min A, Cues on fully turning feet to square up with chair prior to sitting.   Therapeutic Exercise Nu step seat 10 L5 X 10 min UE/LE for functional endurance Leg press DL 13# [id number], then SL 78# [id number] each side Pulleys 2.5 min for shoulder ROM and gripping  06/27/2023: Prosthetic Training with TTA prosthesis PT removed suture & loose scab with tweezers from residual limb.  No open areas so PT recommended discontinuing use of under liner of [employer].  Sit to stand with RW and patient pushing off wheelchair with min to mod assist, requires cues for set up, hand placement, and sequence, X 5 reps total. Patient ambulated 63' and 25' with RW with minA first 2 steps then CGA. No w/c following.  Stand pivot transfer Nustep to w/c with RW with CGA once pt standing.  Cues on fully turning feet to square up with chair prior to sitting.   Therapeutic Exercise Nu step seat 10 L5 X 10 min UE/LE for functional endurance Leg press DL 29# [id number], then SL 13# 2G40 each side Squat over chair with BUE on RW that PT stabilized 5 reps.  PT recommended to husband & pt to start going to [facility] 1x/wk on Sat or Sunday to get used to using leg press and recumbent stepper there. So when we discharge from PT then she can continue with exercises. Pt verbalized understanding.    06/22/2023: Prosthetic Training with TTA prosthesis Sit to stand with RW and patient pushing off wheelchair with min to mod assist, requires cues for set up, hand placement, and sequence, X 5 reps total Patient ambulated 55 feet max tolerated distance (Spo2 99% and HR 69 after), 40 feet, and 55 feet all with rolling walker and min A  Stand pivot transfer with mod A to and from wheelchair to leg press and back in preparation/simulation for car transfer  Therapeutic Exercise Nu step L5 X 10 min UE/LE for functional endurance Leg press DL 10# 2V25, then SL 36# 6Y40 each side  06/20/2023: Prosthetic Training with TTA  prosthesis Sit to stand with RW and patient pushing off wheelchair with min to mod assist, requires cues for set up, hand placement, and sequence, X 5 reps total Patient ambulated 55 feet max tolerated distance (Spo2 99% and HR 69 after), 50 feet, and 40 feet all with rolling walker and min A with 180 deg turn to Rt and left to sit in chair Stand pivot transfer with mod A to and from wheelchair to leg press and back in preparation/simulation for car transfer Car transfer stand pivot transfer from wheelchair to car seat and back with PT mod A, car transfer with RW from wheelchair to car and back with mod A with PT. Then had CNA try this with her using RW but not able to complete due to patient not being able to back up close enough to car seat so PT took over, then had CNA transfer her back from car seat to wheelchair with PT supervision  Therapeutic Exercise Nu step L5 X 8 min UE/LE for functional endurance Leg press DL 34# 7Q25, then SL 95# 6L87 each side  HOME EXERCISE PROGRAM: Discussed walking program with aide at home, frequent short walks  ASSESSMENT:  CLINICAL IMPRESSION:  Discussed walking program with aide at home for frequent short walks to improve her standing and walking tolerance, strength, and endurance. We again performed gym activity that she can begin doing at the [facility] to also help with her progress.   OBJECTIVE IMPAIRMENTS: Abnormal gait, decreased activity tolerance, decreased balance, decreased cognition, decreased coordination, decreased endurance, decreased knowledge of condition, decreased knowledge of use of DME, decreased mobility, difficulty walking, decreased ROM, decreased strength, postural dysfunction, prosthetic dependency , obesity, and pain.   ACTIVITY LIMITATIONS: standing, stairs, transfers, locomotion level, and safe prosthesis use.  PARTICIPATION LIMITATIONS: community activity, church, and household mobility  PERSONAL FACTORS: Age, Fitness,  Past/current experiences, Time since onset of injury/illness/exacerbation, and 3+ comorbidities: see PMH  are also affecting patient's functional outcome.   REHAB POTENTIAL: Good  CLINICAL DECISION MAKING: Evolving/moderate complexity  EVALUATION COMPLEXITY: Moderate   GOALS: Goals reviewed with patient? Yes  SHORT TERM GOALS: Target date: 07/14/2023  Patient reports transferring out of w/c >/= 2x/day on non-dialysis days with CNA or family assistance.  Baseline: SEE OBJECTIVE DATA Goal status: Ongoing 06/27/2023 2.  Patient reports knee pain <4/10.  Baseline: SEE OBJECTIVE DATA Goal status:  Ongoing 06/27/2023  3.  Patient sit to/from stand modA. Baseline: SEE OBJECTIVE DATA Goal status: Ongoing 06/27/2023  4. Patient ambulates 38' between 2 chairs (without w/c following pt) with minA. Baseline: SEE OBJECTIVE DATA Goal status: Ongoing 06/27/2023    LONG TERM GOALS: Target date: 09/07/2023  Patient & family / CNA demonstrates & verbalized understanding of prosthetic care to enable safe utilization of prosthesis. Baseline: SEE OBJECTIVE DATA Goal status: Ongoing 06/27/2023  Patient tolerates prosthesis wear >90% of awake hours without skin or limb pain issues and knee pain </= 4/10.  Baseline: SEE OBJECTIVE DATA Goal status:  Ongoing 06/27/2023  Stand balance with walker: static 2 min with supervision to enable ability for standing ADLS. Baseline: SEE OBJECTIVE DATA Goal status: Ongoing 06/27/2023  Patient ambulates >50' with RW & prosthesis only with family / CNA assist safely.  Baseline: SEE OBJECTIVE DATA Goal status: Ongoing 06/27/2023  Patient transfers stand pivot including sit to/from stand with RW with minA or less.  Baseline: SEE OBJECTIVE DATA Goal status:  Ongoing 06/27/2023   PLAN:  PT FREQUENCY: 1-2x/week  PT DURATION: 12 weeks / 90 days  PLANNED INTERVENTIONS: Therapeutic exercises, Therapeutic activity, Neuromuscular re-education, Balance training, Gait  training, Patient/Family education, Self Care, Vestibular training, Prosthetic training, DME instructions, and Manual therapy  PLAN FOR NEXT SESSION:  check on [employer] recommendation for leg press & Nustep only initially,  Continue Work on patient walking between 2 chairs and decreasing need for wheelchair to be followed behind patient.  Continue gait and transfers including stand pivot with rolling walker, leg strength, balance  April Manson, PT, DPT 07/04/2023, 4:36 PM

## 2023-07-06 ENCOUNTER — Ambulatory Visit: Payer: Medicare PPO | Admitting: Physical Therapy

## 2023-07-06 ENCOUNTER — Encounter: Payer: Self-pay | Admitting: Physical Therapy

## 2023-07-06 DIAGNOSIS — G8929 Other chronic pain: Secondary | ICD-10-CM

## 2023-07-06 DIAGNOSIS — M6281 Muscle weakness (generalized): Secondary | ICD-10-CM | POA: Diagnosis not present

## 2023-07-06 DIAGNOSIS — R2689 Other abnormalities of gait and mobility: Secondary | ICD-10-CM

## 2023-07-06 DIAGNOSIS — R2681 Unsteadiness on feet: Secondary | ICD-10-CM

## 2023-07-06 DIAGNOSIS — M25562 Pain in left knee: Secondary | ICD-10-CM

## 2023-07-06 DIAGNOSIS — I69354 Hemiplegia and hemiparesis following cerebral infarction affecting left non-dominant side: Secondary | ICD-10-CM

## 2023-07-06 DIAGNOSIS — M25561 Pain in right knee: Secondary | ICD-10-CM

## 2023-07-06 NOTE — Therapy (Signed)
OUTPATIENT PHYSICAL THERAPY TREATMENT   Patient Name: THANA HEEB MRN: [id number] DOB:October 28, 1950, 72 y.o., female Today's Date: 07/06/2023  PCP: No PCP on file REFERRING PROVIDER: Aldean Baker, MD     END OF SESSION:  PT End of Session - 07/06/23 1626     Visit Number 32    Number of Visits 46    Date for PT Re-Evaluation 09/07/23    Authorization Type [employer] PPO    Authorization Time Period $20 COPAY, Approved 12 PT VISITS  Authorization #[id number]  Tracking #[id number] 8/20-11/14    Authorization - Visit Number 7    Authorization - Number of Visits 12    Progress Note Due on Visit 36    PT Start Time 1555    PT Stop Time 1640    PT Time Calculation (min) 45 min    Equipment Utilized During Treatment Gait belt    Activity Tolerance Patient limited by fatigue;Patient tolerated treatment well    Behavior During Therapy WFL for tasks assessed/performed                   Past Medical History:  Diagnosis Date   Acute GI bleeding    Allergy    Anemia    Anterior chest wall pain    Appendicitis 1965   Asthma    Body mass index 37.0-37.9, adult    Breast pain    Cataract    both eyes   CHF (congestive heart failure) (HCC)    Chronic kidney disease    stage 5 - on dialysis   Cognitive change 04/20/2021   r/t cva 03/2821   Complication of anesthesia    Memory loss after general   Dehydration 2014   Deviated septum 1971   Diabetes mellitus    Dysphagia due to old stroke    easy to get strangled when eating   Dyspnea 2014   Extrinsic asthma    WITH ASTHMA ATTACK   Fibroid 1980   GERD (gastroesophageal reflux disease)    Heart murmur    History of migraine    History of seizure    with stroke   Hx gestational diabetes    Hyperlipidemia    Hypertension 2014   Inguinal hernia 1959   Malaise and fatigue 2014   Non-IgE mediated allergic asthma 2014   Obesity    Pelvic pain    Pregnancy, high-risk 1985   Stroke (HCC) 04/20/2021    (CVA) of right basal ganglia   Tonsillitis 1968   Uterine fibroid 1980   Visual field defect    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   Past Surgical History:  Procedure Laterality Date   ABDOMINAL AORTOGRAM W/LOWER EXTREMITY N/A 02/21/2022   Procedure: ABDOMINAL AORTOGRAM W/LOWER EXTREMITY;  Surgeon: Maeola Harman, MD;  Location: [facility] INVASIVE CV LAB;  Service: Cardiovascular;  Laterality: N/A;   AMPUTATION Right 05/18/2022   Procedure: RIGHT BELOW KNEE AMPUTATION;  Surgeon: Nadara Mustard, MD;  Location: [facility] OR;  Service: Orthopedics;  Laterality: Right;   AMPUTATION TOE Right 04/15/2022   Procedure: AMPUTATION  LST TOERIGHT FOOT;  Surgeon: Edwin Cap, DPM;  Location: [facility] ORS;  Service: Podiatry;  Laterality: Right;   APPENDECTOMY  1959   BASCILIC VEIN TRANSPOSITION Right 04/30/2021   Procedure: RIGHT FIRST STAGE BASCILIC VEIN TRANSPOSITION;  Surgeon: Chuck Hint, MD;  Location: [facility] OR;  Service: Vascular;  Laterality: Right;   CESAREAN SECTION  1985   COLONOSCOPY  ESOPHAGOGASTRODUODENOSCOPY (EGD) WITH PROPOFOL N/A 04/18/2021   Procedure: ESOPHAGOGASTRODUODENOSCOPY (EGD) WITH PROPOFOL;  Surgeon: Iva Boop, MD;  Location: [facility] ENDOSCOPY;  Service: Endoscopy;  Laterality: N/A;   EYE SURGERY     bilateral cataract    FLEXIBLE SIGMOIDOSCOPY N/A 04/18/2021   Procedure: FLEXIBLE SIGMOIDOSCOPY;  Surgeon: Iva Boop, MD;  Location: [facility]OS[facility];  Service: Endoscopy;  Laterality: N/A;   HERNIA REPAIR  1959   IR FLUORO GUIDE CV LINE RIGHT  03/18/2021   IR US GUIDE VASC ACCESS RIGHT  03/18/2021   LEFT HEART CATH AND CORONARY ANGIOGRAPHY N/A 04/04/2017   Procedure: Left Heart Cath and Coronary Angiography;  Surgeon: Lyn Records, MD;  Location: [facility] INVASIVE CV LAB;  Service: Cardiovascular;  Laterality: N/A;   MYOMECTOMY  1980, 2004, 2007   PERIPHERAL VASCULAR THROMBECTOMY  02/21/2022   Procedure: PERIPHERAL VASCULAR THROMBECTOMY;  Surgeon: Maeola Harman, MD;  Location: [facility]  INVASIVE CV LAB;  Service: Cardiovascular;;  Right Tibial   RHINOPLASTY  1971   ROTATOR CUFF REPAIR  2003   SURGICAL REPAIR OF HEMORRHAGE  2015   TONSILLECTOMY  1968   Patient Active Problem List   Diagnosis Date Noted   Wheelchair dependence 06/09/2023   Hx of right BKA (HCC) 03/10/2023   CHF (congestive heart failure) (HCC) 02/10/2023   Dyspnea 02/09/2023   Acute respiratory distress 02/09/2023   Urinary tract infection without hematuria    Diabetes mellitus due to underlying condition with hypoglycemia without coma, with long-term current use of insulin (HCC)    Seizure disorder (HCC) 06/22/2022   Acute respiratory failure (HCC)    Unresponsiveness    Pressure ulcer 06/07/2022   Acute stroke due to ischemia (HCC) 06/03/2022   Chest pain 06/01/2022   History of CVA (cerebrovascular accident) 06/01/2022   Seizure (HCC)    Encephalopathy    Below-knee amputation of right lower extremity (HCC)    Leukocytosis    Foot infection 05/10/2022   PAD (peripheral artery disease) (HCC) 05/10/2022   Gangrene of right foot (HCC) 04/15/2022   Left hemiparesis (HCC) 09/24/2021   Abnormality of gait 09/24/2021   Pain of right hand 08/06/2021   Steal syndrome of dialysis vascular access (HCC) 08/06/2021   Subclavian steal syndrome of right subclavian artery 06/25/2021   Right hand weakness 06/25/2021   Stroke-like symptoms 05/06/2021   Bandemia    Cerebrovascular accident (CVA) of right basal ganglia (HCC) 04/20/2021   Hemorrhoids    Basal ganglia infarction (HCC) 04/16/2021   ESRD on dialysis (HCC) 04/16/2021   Hypertensive urgency 03/10/2021   Hilar enlargement    Anemia of chronic disease    Chronic diastolic CHF (congestive heart failure) (HCC) 03/09/2021   CKD (chronic kidney disease), stage V (HCC) 03/08/2021   Diabetic retinopathy (HCC) 03/08/2021   Hyperglycemia due to type 2 diabetes mellitus (HCC) 03/08/2021   Pure hypercholesterolemia 03/08/2021   Anemia in chronic kidney  disease 09/24/2020   Uncontrolled type 2 diabetes mellitus with hyperglycemia (HCC) 03/19/2019   Osteopenia 09/04/2018   Migraine 09/04/2018   Asthma 09/04/2018   Pseudophakia of both eyes 07/17/2018   Pseudophakia of left eye 07/03/2018   Open angle with borderline findings and high glaucoma risk in left eye 07/03/2018   Age-related nuclear cataract of right eye 07/03/2018   CAD (coronary artery disease), native coronary artery 04/27/2017   Abnormal nuclear stress test 04/04/2017   Shortness of breath 03/09/2017   Appendicitis    Age-related hypermature cataract of both eyes 12/20/2016  Uterine leiomyoma 11/27/2012   Dyslipidemia (high LDL; low HDL) 11/25/2011   Obesity (BMI 30-39.9) 11/25/2011   Hypertensive disorder 11/21/2011   Type 2 diabetes mellitus (HCC) 11/21/2011   Abnormal EKG 11/21/2011    ONSET DATE: 12/29/2022 prosthesis delivery  REFERRING DIAG: U98.119J (ICD-10-CM) - Below-knee amputation of right lower extremity   THERAPY DIAG:  Unsteadiness on feet  Other abnormalities of gait and mobility  Muscle weakness (generalized)  Hemiplegia and hemiparesis following cerebral infarction affecting left non-dominant side (HCC)  Chronic pain of both knees  Rationale for Evaluation and Treatment: Rehabilitation  SUBJECTIVE:   SUBJECTIVE STATEMENT:"She endorses some knee pain and back pain with walking but goes away when she sits back down"   PERTINENT HISTORY: CVA / left hemiparesis due to right basal ganglia infarct 7/22, right hip bursitis, anxiety, dCHF, ESRD on HD, HTN, DM, CAD  PAIN:  Are you having pain?  Yes: NPRS scale: reports no pain in knee or back sitting, but transition with standing  7-9/10 at times Pain location: Rt knee, back Pain description: throbbing Aggravating factors: standing, walking Relieving factors: Voltarin & massage  PRECAUTIONS: Fall and Other: NO BP RUE  WEIGHT BEARING RESTRICTIONS: No  FALLS: Has patient fallen in last 6  months? No  LIVING ENVIRONMENT: Lives with: lives with their spouse and CNAs 24 hrs 7 days / wk Lives in: House Home Access: Ramped entrance Home layout: Multi level, Full bath on main level, and Able to live on main level with bedroom and bathroom Stairs: Yes: Internal: 16 steps; on left going up and External: 4 steps; on right going up Has following equipment at home: Single point cane, Walker - 2 wheeled, Wheelchair (manual), and shower chair  PLOF: Independent with household mobility with device and Independent with community mobility with device  PATIENT GOALS: walk with prosthesis, stand & transfer.   OBJECTIVE:  COGNITION: Overall cognitive status: History of cognitive impairments - at baseline  POSTURE: rounded shoulders, forward head, decreased lumbar lordosis, increased thoracic kyphosis, and flexed trunk   LOWER EXTREMITY ROM: Eval / 01/05/2023:    BLEs PROM WFL except unable to assess hip ext  (Blank rows = not tested)  LOWER EXTREMITY MMT:  MMT Right eval Right 03/06/23  Hip flexion 3-/5 3-/5  Hip extension 3-/5 3-/5  Hip abduction 3-/5 3-/5  Hip adduction    Hip internal rotation    Hip external rotation    Knee flexion 3-/5 3-/5  Knee extension 3-/5 3-/5  Ankle dorsiflexion NA NA  Ankle plantarflexion NA NA  Ankle inversion NA NA  Ankle eversion NA NA  03/06/2023: LLE hemiparesis but able to advance during gait without assistance.   Eval / 01/05/2023: left LE & UE hemiparesis.  In gait slides to advance LLE and knee / hip instability in stance.  (Blank rows = not tested)  TRANSFERS: 06/13/2023: Sit to stand w/c & 18" chair with armrests to RW with modA. Stand to sit with minA. Stand-pivot transfer with RW with minA once pt standing.   05/09/23 Sit to stand w/c & 18" chair with armrests to RW with modA. Stand to sit with minA. Stand-pivot  transfer with RW with modA / constant cueing. Stand-pivot transfer holding onto PT mod A  03/06/2023:  Sit to stand  w/c & 18" chair with armrests to RW with modA. Stand to sit with minA. Stand-pivot  transfer with RW with modA / constant cueing.  02/07/2023: Sit to stand w/c or 18" chair with armrests to  RW with modA. Stand to sit with minA. Both require constant cues. Stand-pivot transfer with RW with modA / constant cueing. (2nd person for safety)   01/05/2023 / Eval: Sit to stand: Max A pulling BUEs on //bars from w/c Stand to sit: Min A using BUEs //bars to control descent Lateral scoot transfers: Mod A w/c to / from mat table level & adjacent   FUNCTIONAL TESTs:  06/13/2023: PT manually cues weight shift over feet with modA initially then pt able to stand 2 min with RW support with close supervision.   03/06/2023: static stance with RW support 60 sec with minA.  Eval / 01/05/2023:  Standing in //bars for 30 sec with modA.   GAIT: 06/13/2023: Pt amb 25' with RW with minA once pt standing.  Pt required w/c following so she can sit immediately upon fatiguing.   03/06/2023: Pt amb 5' with RW with +2 modA including 90* turn to chair.  02/07/2023: Pt amb 5' with RW with +2 modA including 90* turn to chair.   01/05/2023 / Eval: Gait pattern: step to pattern, decreased step length- Right, decreased step length- Left, decreased stance time- Right, decreased stance time- Left, decreased hip/knee flexion- Left, decreased ankle dorsiflexion- Left, Right hip hike, Left hip hike, knee flexed in stance- Left, antalgic, lateral hip instability, trunk flexed, wide BOS, and poor foot clearance- Left Distance walked: 3' Assistive device utilized: // bars & RLE TTA prosthesis Level of assistance: Total A / 2 person Comments:  LLE with poor clearance / slides foot forward and knee / hip instability in stance.   CURRENT PROSTHETIC WEAR ASSESSMENT: 06/13/2023: pt & husband report pt wearing prosthesis all awake hours except during dialysis and ~ 1 hour when her limb starts to throb.  Pt & husband report proper limb  care, don/doff and cleaning prosthesis. They require intermittent PT instruction for adjusting ply socks and weight bearing.   03/06/2023: Patient's husband & CNA correctly don & doff her prosthesis. Pt is dependent requiring maxA to don or doff.  She reports wearing prosthesis 5 hrs 2x/day for 10 hours total on non-dialysis days and up to 8 hours on dialysis days.  Her limb has scab on lateral incision that appears to be internal suture working way out. No signs of infection. Her residual limb appearance is dry skin, normal temperature, limited to no hair growth, cylindrical shape.   Eval / 01/05/2023:  Patient is dependent with: skin check, residual limb care, care of non-amputated limb, prosthetic cleaning, ply sock cleaning, correct ply sock adjustment, proper wear schedule/adjustment, and proper weight-bearing schedule/adjustment Donning prosthesis: Total A / family & CNA requires 100% cueing Doffing prosthesis: Total A / family & CNA requires 100% cueing Prosthetic wear tolerance: 1-1.5 hours, 1x/day, 6 of 7 days since delivery Prosthetic weight bearing tolerance: 3 minutes during eval with c/o knee pain Edema: pitting Residual limb condition: lateral incision with 3mm X 6mm long scabs with serous drainage, arrived with bandaid over wound,  dry skin, normal temperature & color, cylindrical shape Prosthetic description: silicon liner with pin lock suspension, total contact socket, SACH foot K code/activity level with prosthetic use: Level 1    TODAY'S TREATMENT:  DATE: 07/06/2023: Prosthetic Training with TTA prosthesis Sit to stand with RW and patient pushing off wheelchair with min to mod assist, requires cues for set up, hand placement, and sequence, X 5 reps total. Patient ambulated 30 feet X 2 with min A to CGA. No w/c following.  Stand pivot transfer wheelchair to  standard chair min A, Cues on fully turning feet to square up with chair prior to sitting.   Therapeutic Exercise Nu step seat 10 L6 X 10 min UE/LE for functional endurance Leg press DL 78# [id number], then SL 21# [id number] each side Pulleys 2.5 min for shoulder ROM and gripping  07/04/2023: Prosthetic Training with TTA prosthesis Sit to stand with RW and patient pushing off wheelchair with min to mod assist, requires cues for set up, hand placement, and sequence, X 5 reps total. Patient ambulated 30 feet X 2 with min A to CGA. No w/c following.  Stand pivot transfer wheelchair to standard chair min A, Cues on fully turning feet to square up with chair prior to sitting.   Therapeutic Exercise Nu step seat 10 L5 X 10 min UE/LE for functional endurance Leg press DL 57# [id number], then SL 62# [id number] each side Pulleys 2.5 min for shoulder ROM and gripping   HOME EXERCISE PROGRAM: Discussed walking program with aide at home, frequent short walks  ASSESSMENT:  CLINICAL IMPRESSION:  I did reach out to other clinic about aquiring a wrist splint and platform to add to RW so see if this will help improve her ambulation tolerance as her left wrist gives out on her limiting how far she can walk. She also continues to be limited by knee pain and back pain during ambulation but she can at least ambulate short household distances now which has improved her function.   OBJECTIVE IMPAIRMENTS: Abnormal gait, decreased activity tolerance, decreased balance, decreased cognition, decreased coordination, decreased endurance, decreased knowledge of condition, decreased knowledge of use of DME, decreased mobility, difficulty walking, decreased ROM, decreased strength, postural dysfunction, prosthetic dependency , obesity, and pain.   ACTIVITY LIMITATIONS: standing, stairs, transfers, locomotion level, and safe prosthesis use.  PARTICIPATION LIMITATIONS: community activity, church, and household mobility  PERSONAL FACTORS: Age,  Fitness, Past/current experiences, Time since onset of injury/illness/exacerbation, and 3+ comorbidities: see PMH  are also affecting patient's functional outcome.   REHAB POTENTIAL: Good  CLINICAL DECISION MAKING: Evolving/moderate complexity  EVALUATION COMPLEXITY: Moderate   GOALS: Goals reviewed with patient? Yes  SHORT TERM GOALS: Target date: 07/14/2023  Patient reports transferring out of w/c >/= 2x/day on non-dialysis days with CNA or family assistance.  Baseline: SEE OBJECTIVE DATA Goal status: Ongoing 06/27/2023 2.  Patient reports knee pain <4/10.  Baseline: SEE OBJECTIVE DATA Goal status:  Ongoing 06/27/2023  3.  Patient sit to/from stand modA. Baseline: SEE OBJECTIVE DATA Goal status: Ongoing 06/27/2023  4. Patient ambulates 24' between 2 chairs (without w/c following pt) with minA. Baseline: SEE OBJECTIVE DATA Goal status: Ongoing 06/27/2023    LONG TERM GOALS: Target date: 09/07/2023  Patient & family / CNA demonstrates & verbalized understanding of prosthetic care to enable safe utilization of prosthesis. Baseline: SEE OBJECTIVE DATA Goal status: Ongoing 06/27/2023  Patient tolerates prosthesis wear >90% of awake hours without skin or limb pain issues and knee pain </= 4/10.  Baseline: SEE OBJECTIVE DATA Goal status:  Ongoing 06/27/2023  Stand balance with walker: static 2 min with supervision to enable ability for standing ADLS. Baseline: SEE OBJECTIVE DATA Goal status: Ongoing 06/27/2023  Patient  ambulates >50' with RW & prosthesis only with family / CNA assist safely.  Baseline: SEE OBJECTIVE DATA Goal status: Ongoing 06/27/2023  Patient transfers stand pivot including sit to/from stand with RW with minA or less.  Baseline: SEE OBJECTIVE DATA Goal status:  Ongoing 06/27/2023   PLAN:  PT FREQUENCY: 1-2x/week  PT DURATION: 12 weeks / 90 days  PLANNED INTERVENTIONS: Therapeutic exercises, Therapeutic activity, Neuromuscular re-education, Balance training, Gait  training, Patient/Family education, Self Care, Vestibular training, Prosthetic training, DME instructions, and Manual therapy  PLAN FOR NEXT SESSION:  Try wrist splint on RW. Continue Work on patient walking between 2 chairs and decreasing need for wheelchair to be followed behind patient.  Continue gait and transfers including car transfers when car available. stand pivot with rolling walker, leg strength, balance  April Manson, PT, DPT 07/06/2023, 4:29 PM

## 2023-07-11 ENCOUNTER — Encounter: Payer: Self-pay | Admitting: Physical Therapy

## 2023-07-11 ENCOUNTER — Ambulatory Visit: Payer: Medicare PPO | Admitting: Physical Therapy

## 2023-07-11 DIAGNOSIS — R2689 Other abnormalities of gait and mobility: Secondary | ICD-10-CM | POA: Diagnosis not present

## 2023-07-11 DIAGNOSIS — R2681 Unsteadiness on feet: Secondary | ICD-10-CM

## 2023-07-11 DIAGNOSIS — I69354 Hemiplegia and hemiparesis following cerebral infarction affecting left non-dominant side: Secondary | ICD-10-CM | POA: Diagnosis not present

## 2023-07-11 DIAGNOSIS — M25562 Pain in left knee: Secondary | ICD-10-CM

## 2023-07-11 DIAGNOSIS — M6281 Muscle weakness (generalized): Secondary | ICD-10-CM

## 2023-07-11 DIAGNOSIS — M25561 Pain in right knee: Secondary | ICD-10-CM

## 2023-07-11 DIAGNOSIS — G8929 Other chronic pain: Secondary | ICD-10-CM

## 2023-07-11 NOTE — Therapy (Signed)
OUTPATIENT PHYSICAL THERAPY TREATMENT   Patient Name: Carolyn Russo MRN: [id number] DOB:[birth year], 72 y.o., female Today's Date: 07/11/2023  PCP: No PCP on file REFERRING PROVIDER: Aldean Baker, MD     END OF SESSION:  PT End of Session - 07/11/23 1517     Visit Number 33    Number of Visits 46    Date for PT Re-Evaluation 09/07/23    Authorization Type [employer] PPO    Authorization Time Period $20 COPAY, Approved 12 PT VISITS  Authorization #[id number]  Tracking #[id number] 8/20-11/14    Authorization - Visit Number 8    Authorization - Number of Visits 12    Progress Note Due on Visit 36    PT Start Time 1440    PT Stop Time 1520    PT Time Calculation (min) 40 min    Equipment Utilized During Treatment Gait belt    Activity Tolerance Patient limited by fatigue;Patient tolerated treatment well    Behavior During Therapy WFL for tasks assessed/performed                   Past Medical History:  Diagnosis Date   Acute GI bleeding    Allergy    Anemia    Anterior chest wall pain    Appendicitis 1965   Asthma    Body mass index 37.0-37.9, adult    Breast pain    Cataract    both eyes   CHF (congestive heart failure) (HCC)    Chronic kidney disease    stage 5 - on dialysis   Cognitive change 04/20/2021   r/t cva 03/2821   Complication of anesthesia    Memory loss after general   Dehydration 2014   Deviated septum 1971   Diabetes mellitus    Dysphagia due to old stroke    easy to get strangled when eating   Dyspnea 2014   Extrinsic asthma    WITH ASTHMA ATTACK   Fibroid 1980   GERD (gastroesophageal reflux disease)    Heart murmur    History of migraine    History of seizure    with stroke   Hx gestational diabetes    Hyperlipidemia    Hypertension 2014   Inguinal hernia 1959   Malaise and fatigue 2014   Non-IgE mediated allergic asthma 2014   Obesity    Pelvic pain    Pregnancy, high-risk 1985   Stroke (HCC) 04/20/2021    (CVA) of right basal ganglia   Tonsillitis 1968   Uterine fibroid 1980   Visual field defect    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   Past Surgical History:  Procedure Laterality Date   ABDOMINAL AORTOGRAM W/LOWER EXTREMITY N/A 02/21/2022   Procedure: ABDOMINAL AORTOGRAM W/LOWER EXTREMITY;  Surgeon: Maeola Harman, MD;  Location: [facility] INVASIVE CV LAB;  Service: Cardiovascular;  Laterality: N/A;   AMPUTATION Right 05/18/2022   Procedure: RIGHT BELOW KNEE AMPUTATION;  Surgeon: Nadara Mustard, MD;  Location: [facility] OR;  Service: Orthopedics;  Laterality: Right;   AMPUTATION TOE Right 04/15/2022   Procedure: AMPUTATION  LST TOERIGHT FOOT;  Surgeon: Edwin Cap, DPM;  Location: [facility] ORS;  Service: Podiatry;  Laterality: Right;   APPENDECTOMY  1959   BASCILIC VEIN TRANSPOSITION Right 04/30/2021   Procedure: RIGHT FIRST STAGE BASCILIC VEIN TRANSPOSITION;  Surgeon: Chuck Hint, MD;  Location: [facility] OR;  Service: Vascular;  Laterality: Right;   CESAREAN SECTION  1985   COLONOSCOPY  ESOPHAGOGASTRODUODENOSCOPY (EGD) WITH PROPOFOL N/A 04/18/2021   Procedure: ESOPHAGOGASTRODUODENOSCOPY (EGD) WITH PROPOFOL;  Surgeon: Iva Boop, MD;  Location: [facility] ENDOSCOPY;  Service: Endoscopy;  Laterality: N/A;   EYE SURGERY     bilateral cataract    FLEXIBLE SIGMOIDOSCOPY N/A 04/18/2021   Procedure: FLEXIBLE SIGMOIDOSCOPY;  Surgeon: Iva Boop, MD;  Location: [facility] ENDOSCOPY;  Service: Endoscopy;  Laterality: N/A;   HERNIA REPAIR  1959   IR FLUORO GUIDE CV LINE RIGHT  03/18/2021   IR US GUIDE VASC ACCESS RIGHT  03/18/2021   LEFT HEART CATH AND CORONARY ANGIOGRAPHY N/A 04/04/2017   Procedure: Left Heart Cath and Coronary Angiography;  Surgeon: Lyn Records, MD;  Location: [facility] INVASIVE CV LAB;  Service: Cardiovascular;  Laterality: N/A;   MYOMECTOMY  1980, 2004, 2007   PERIPHERAL VASCULAR THROMBECTOMY  02/21/2022   Procedure: PERIPHERAL VASCULAR THROMBECTOMY;  Surgeon: Maeola Harman, MD;  Location: [facility]  INVASIVE CV LAB;  Service: Cardiovascular;;  Right Tibial   RHINOPLASTY  1971   ROTATOR CUFF REPAIR  2003   SURGICAL REPAIR OF HEMORRHAGE  2015   TONSILLECTOMY  1968   Patient Active Problem List   Diagnosis Date Noted   Wheelchair dependence 06/09/2023   Hx of right BKA (HCC) 03/10/2023   CHF (congestive heart failure) (HCC) 02/10/2023   Dyspnea 02/09/2023   Acute respiratory distress 02/09/2023   Urinary tract infection without hematuria    Diabetes mellitus due to underlying condition with hypoglycemia without coma, with long-term current use of insulin (HCC)    Seizure disorder (HCC) 06/22/2022   Acute respiratory failure (HCC)    Unresponsiveness    Pressure ulcer 06/07/2022   Acute stroke due to ischemia (HCC) 06/03/2022   Chest pain 06/01/2022   History of CVA (cerebrovascular accident) 06/01/2022   Seizure (HCC)    Encephalopathy    Below-knee amputation of right lower extremity (HCC)    Leukocytosis    Foot infection 05/10/2022   PAD (peripheral artery disease) (HCC) 05/10/2022   Gangrene of right foot (HCC) 04/15/2022   Left hemiparesis (HCC) 09/24/2021   Abnormality of gait 09/24/2021   Pain of right hand 08/06/2021   Steal syndrome of dialysis vascular access (HCC) 08/06/2021   Subclavian steal syndrome of right subclavian artery 06/25/2021   Right hand weakness 06/25/2021   Stroke-like symptoms 05/06/2021   Bandemia    Cerebrovascular accident (CVA) of right basal ganglia (HCC) 04/20/2021   Hemorrhoids    Basal ganglia infarction (HCC) 04/16/2021   ESRD on dialysis (HCC) 04/16/2021   Hypertensive urgency 03/10/2021   Hilar enlargement    Anemia of chronic disease    Chronic diastolic CHF (congestive heart failure) (HCC) 03/09/2021   CKD (chronic kidney disease), stage V (HCC) 03/08/2021   Diabetic retinopathy (HCC) 03/08/2021   Hyperglycemia due to type 2 diabetes mellitus (HCC) 03/08/2021   Pure hypercholesterolemia 03/08/2021   Anemia in chronic kidney  disease 09/24/2020   Uncontrolled type 2 diabetes mellitus with hyperglycemia (HCC) 03/19/2019   Osteopenia 09/04/2018   Migraine 09/04/2018   Asthma 09/04/2018   Pseudophakia of both eyes 07/17/2018   Pseudophakia of left eye 07/03/2018   Open angle with borderline findings and high glaucoma risk in left eye 07/03/2018   Age-related nuclear cataract of right eye 07/03/2018   CAD (coronary artery disease), native coronary artery 04/27/2017   Abnormal nuclear stress test 04/04/2017   Shortness of breath 03/09/2017   Appendicitis    Age-related hypermature cataract of both eyes 12/20/2016  Uterine leiomyoma 11/27/2012   Dyslipidemia (high LDL; low HDL) 11/25/2011   Obesity (BMI 30-39.9) 11/25/2011   Hypertensive disorder 11/21/2011   Type 2 diabetes mellitus (HCC) 11/21/2011   Abnormal EKG 11/21/2011    ONSET DATE: 12/29/2022 prosthesis delivery  REFERRING DIAG: X52.841L (ICD-10-CM) - Below-knee amputation of right lower extremity   THERAPY DIAG:  Unsteadiness on feet  Other abnormalities of gait and mobility  Muscle weakness (generalized)  Hemiplegia and hemiparesis following cerebral infarction affecting left non-dominant side (HCC)  Chronic pain of both knees  Rationale for Evaluation and Treatment: Rehabilitation  SUBJECTIVE:   SUBJECTIVE STATEMENT:"She endorses less wrist pain using wrist splint on walker.    PERTINENT HISTORY: CVA / left hemiparesis due to right basal ganglia infarct 7/22, right hip bursitis, anxiety, dCHF, ESRD on HD, HTN, DM, CAD  PAIN:  Are you having pain?  Yes: NPRS scale: reports no pain in knee or back sitting, but transition with standing  7-9/10 at times Pain location: Rt knee, back Pain description: throbbing Aggravating factors: standing, walking Relieving factors: Voltarin & massage  PRECAUTIONS: Fall and Other: NO BP RUE  WEIGHT BEARING RESTRICTIONS: No  FALLS: Has patient fallen in last 6 months? No  LIVING  ENVIRONMENT: Lives with: lives with their spouse and CNAs 24 hrs 7 days / wk Lives in: House Home Access: Ramped entrance Home layout: Multi level, Full bath on main level, and Able to live on main level with bedroom and bathroom Stairs: Yes: Internal: 16 steps; on left going up and External: 4 steps; on right going up Has following equipment at home: Single point cane, Walker - 2 wheeled, Wheelchair (manual), and shower chair  PLOF: Independent with household mobility with device and Independent with community mobility with device  PATIENT GOALS: walk with prosthesis, stand & transfer.   OBJECTIVE:  COGNITION: Overall cognitive status: History of cognitive impairments - at baseline  POSTURE: rounded shoulders, forward head, decreased lumbar lordosis, increased thoracic kyphosis, and flexed trunk   LOWER EXTREMITY ROM: Eval / 01/05/2023:    BLEs PROM WFL except unable to assess hip ext  (Blank rows = not tested)  LOWER EXTREMITY MMT:  MMT Right eval Right 03/06/23  Hip flexion 3-/5 3-/5  Hip extension 3-/5 3-/5  Hip abduction 3-/5 3-/5  Hip adduction    Hip internal rotation    Hip external rotation    Knee flexion 3-/5 3-/5  Knee extension 3-/5 3-/5  Ankle dorsiflexion NA NA  Ankle plantarflexion NA NA  Ankle inversion NA NA  Ankle eversion NA NA  03/06/2023: LLE hemiparesis but able to advance during gait without assistance.   Eval / 01/05/2023: left LE & UE hemiparesis.  In gait slides to advance LLE and knee / hip instability in stance.  (Blank rows = not tested)  TRANSFERS: 06/13/2023: Sit to stand w/c & 18" chair with armrests to RW with modA. Stand to sit with minA. Stand-pivot transfer with RW with minA once pt standing.   05/09/23 Sit to stand w/c & 18" chair with armrests to RW with modA. Stand to sit with minA. Stand-pivot  transfer with RW with modA / constant cueing. Stand-pivot transfer holding onto PT mod A  03/06/2023:  Sit to stand w/c & 18" chair with  armrests to RW with modA. Stand to sit with minA. Stand-pivot  transfer with RW with modA / constant cueing.  02/07/2023: Sit to stand w/c or 18" chair with armrests to RW with modA. Stand to sit with  minA. Both require constant cues. Stand-pivot transfer with RW with modA / constant cueing. (2nd person for safety)   01/05/2023 / Eval: Sit to stand: Max A pulling BUEs on //bars from w/c Stand to sit: Min A using BUEs //bars to control descent Lateral scoot transfers: Mod A w/c to / from mat table level & adjacent   FUNCTIONAL TESTs:  06/13/2023: PT manually cues weight shift over feet with modA initially then pt able to stand 2 min with RW support with close supervision.   03/06/2023: static stance with RW support 60 sec with minA.  Eval / 01/05/2023:  Standing in //bars for 30 sec with modA.   GAIT: 06/13/2023: Pt amb 25' with RW with minA once pt standing.  Pt required w/c following so she can sit immediately upon fatiguing.   03/06/2023: Pt amb 5' with RW with +2 modA including 90* turn to chair.  02/07/2023: Pt amb 5' with RW with +2 modA including 90* turn to chair.   01/05/2023 / Eval: Gait pattern: step to pattern, decreased step length- Right, decreased step length- Left, decreased stance time- Right, decreased stance time- Left, decreased hip/knee flexion- Left, decreased ankle dorsiflexion- Left, Right hip hike, Left hip hike, knee flexed in stance- Left, antalgic, lateral hip instability, trunk flexed, wide BOS, and poor foot clearance- Left Distance walked: 3' Assistive device utilized: // bars & RLE TTA prosthesis Level of assistance: Total A / 2 person Comments:  LLE with poor clearance / slides foot forward and knee / hip instability in stance.   CURRENT PROSTHETIC WEAR ASSESSMENT: 06/13/2023: pt & husband report pt wearing prosthesis all awake hours except during dialysis and ~ 1 hour when her limb starts to throb.  Pt & husband report proper limb care, don/doff and  cleaning prosthesis. They require intermittent PT instruction for adjusting ply socks and weight bearing.   03/06/2023: Patient's husband & CNA correctly don & doff her prosthesis. Pt is dependent requiring maxA to don or doff.  She reports wearing prosthesis 5 hrs 2x/day for 10 hours total on non-dialysis days and up to 8 hours on dialysis days.  Her limb has scab on lateral incision that appears to be internal suture working way out. No signs of infection. Her residual limb appearance is dry skin, normal temperature, limited to no hair growth, cylindrical shape.   Eval / 01/05/2023:  Patient is dependent with: skin check, residual limb care, care of non-amputated limb, prosthetic cleaning, ply sock cleaning, correct ply sock adjustment, proper wear schedule/adjustment, and proper weight-bearing schedule/adjustment Donning prosthesis: Total A / family & CNA requires 100% cueing Doffing prosthesis: Total A / family & CNA requires 100% cueing Prosthetic wear tolerance: 1-1.5 hours, 1x/day, 6 of 7 days since delivery Prosthetic weight bearing tolerance: 3 minutes during eval with c/o knee pain Edema: pitting Residual limb condition: lateral incision with 3mm X 6mm long scabs with serous drainage, arrived with bandaid over wound,  dry skin, normal temperature & color, cylindrical shape Prosthetic description: silicon liner with pin lock suspension, total contact socket, SACH foot K code/activity level with prosthetic use: Level 1    TODAY'S TREATMENT:  DATE: 07/11/2023: Prosthetic Training with TTA prosthesis Sit to stand with RW and patient pushing off wheelchair with min to mod assist, requires cues for set up, hand placement, and sequence, X 5 reps total. Patient ambulated 30 feet X 2 with min A to CGA. No w/c following.  Stand pivot transfer wheelchair to standard chair min  A, Cues on fully turning feet to square up with chair prior to sitting.   Therapeutic Exercise Nu step seat 10 L6 X 8 min UE/LE for functional endurance Leg press DL 47# [id number], then SL 62# [id number] each side Pulleys 2.5 min for shoulder ROM and gripping  07/06/2023: Prosthetic Training with TTA prosthesis Sit to stand with RW and patient pushing off wheelchair with min to mod assist, requires cues for set up, hand placement, and sequence, X 5 reps total. Patient ambulated 30 feet X 2 with min A to CGA. No w/c following.  Stand pivot transfer wheelchair to standard chair min A, Cues on fully turning feet to square up with chair prior to sitting.   Therapeutic Exercise Nu step seat 10 L6 X 10 min UE/LE for functional endurance Leg press DL 65# [id number], then SL 96# 2X52 each side Pulleys 2.5 min for shoulder ROM and gripping  07/04/2023: Prosthetic Training with TTA prosthesis Sit to stand with RW and patient pushing off wheelchair with min to mod assist, requires cues for set up, hand placement, and sequence, X 5 reps total. Patient ambulated 30 feet X 2 with min A to CGA. No w/c following.  Stand pivot transfer wheelchair to standard chair min A, Cues on fully turning feet to square up with chair prior to sitting.   Therapeutic Exercise Nu step seat 10 L5 X 10 min UE/LE for functional endurance Leg press DL 84# 1L24, then SL 40# [id number] each side Pulleys 2.5 min for shoulder ROM and gripping   HOME EXERCISE PROGRAM: Discussed walking program with aide at home, frequent short walks  ASSESSMENT:  CLINICAL IMPRESSION:  She appeared to have less left wrist pain ambulating using wrist splint on RW. We will continue to practice with this to see if it is worth getting her one for home.   OBJECTIVE IMPAIRMENTS: Abnormal gait, decreased activity tolerance, decreased balance, decreased cognition, decreased coordination, decreased endurance, decreased knowledge of condition, decreased knowledge of use of  DME, decreased mobility, difficulty walking, decreased ROM, decreased strength, postural dysfunction, prosthetic dependency , obesity, and pain.   ACTIVITY LIMITATIONS: standing, stairs, transfers, locomotion level, and safe prosthesis use.  PARTICIPATION LIMITATIONS: community activity, church, and household mobility  PERSONAL FACTORS: Age, Fitness, Past/current experiences, Time since onset of injury/illness/exacerbation, and 3+ comorbidities: see PMH  are also affecting patient's functional outcome.   REHAB POTENTIAL: Good  CLINICAL DECISION MAKING: Evolving/moderate complexity  EVALUATION COMPLEXITY: Moderate   GOALS: Goals reviewed with patient? Yes  SHORT TERM GOALS: Target date: 07/14/2023  Patient reports transferring out of w/c >/= 2x/day on non-dialysis days with CNA or family assistance.  Baseline: SEE OBJECTIVE DATA Goal status: Ongoing 06/27/2023 2.  Patient reports knee pain <4/10.  Baseline: SEE OBJECTIVE DATA Goal status:  Ongoing 06/27/2023  3.  Patient sit to/from stand modA. Baseline: SEE OBJECTIVE DATA Goal status: Ongoing 06/27/2023  4. Patient ambulates 60' between 2 chairs (without w/c following pt) with minA. Baseline: SEE OBJECTIVE DATA Goal status: Ongoing 06/27/2023    LONG TERM GOALS: Target date: 09/07/2023  Patient & family / CNA demonstrates & verbalized understanding of prosthetic care to enable  safe utilization of prosthesis. Baseline: SEE OBJECTIVE DATA Goal status: Ongoing 06/27/2023  Patient tolerates prosthesis wear >90% of awake hours without skin or limb pain issues and knee pain </= 4/10.  Baseline: SEE OBJECTIVE DATA Goal status:  Ongoing 06/27/2023  Stand balance with walker: static 2 min with supervision to enable ability for standing ADLS. Baseline: SEE OBJECTIVE DATA Goal status: Ongoing 06/27/2023  Patient ambulates >50' with RW & prosthesis only with family / CNA assist safely.  Baseline: SEE OBJECTIVE DATA Goal status: Ongoing  06/27/2023  Patient transfers stand pivot including sit to/from stand with RW with minA or less.  Baseline: SEE OBJECTIVE DATA Goal status:  Ongoing 06/27/2023   PLAN:  PT FREQUENCY: 1-2x/week  PT DURATION: 12 weeks / 90 days  PLANNED INTERVENTIONS: Therapeutic exercises, Therapeutic activity, Neuromuscular re-education, Balance training, Gait training, Patient/Family education, Self Care, Vestibular training, Prosthetic training, DME instructions, and Manual therapy  PLAN FOR NEXT SESSION:  work with wrist splint on RW. Continue Work on patient walking between 2 chairs and decreasing need for wheelchair to be followed behind patient.  Continue gait and transfers including car transfers when car available. stand pivot with rolling walker, leg strength, balance  April Manson, PT, DPT 07/11/2023, 3:19 PM

## 2023-07-18 ENCOUNTER — Encounter (INDEPENDENT_AMBULATORY_CARE_PROVIDER_SITE_OTHER): Payer: Medicare PPO | Admitting: Ophthalmology

## 2023-07-18 ENCOUNTER — Encounter: Payer: Self-pay | Admitting: Physical Therapy

## 2023-07-18 ENCOUNTER — Ambulatory Visit: Payer: Medicare PPO | Admitting: Physical Therapy

## 2023-07-18 DIAGNOSIS — R2689 Other abnormalities of gait and mobility: Secondary | ICD-10-CM | POA: Diagnosis not present

## 2023-07-18 DIAGNOSIS — I69354 Hemiplegia and hemiparesis following cerebral infarction affecting left non-dominant side: Secondary | ICD-10-CM

## 2023-07-18 DIAGNOSIS — E113512 Type 2 diabetes mellitus with proliferative diabetic retinopathy with macular edema, left eye: Secondary | ICD-10-CM

## 2023-07-18 DIAGNOSIS — M25562 Pain in left knee: Secondary | ICD-10-CM

## 2023-07-18 DIAGNOSIS — H35033 Hypertensive retinopathy, bilateral: Secondary | ICD-10-CM

## 2023-07-18 DIAGNOSIS — I10 Essential (primary) hypertension: Secondary | ICD-10-CM

## 2023-07-18 DIAGNOSIS — E113591 Type 2 diabetes mellitus with proliferative diabetic retinopathy without macular edema, right eye: Secondary | ICD-10-CM | POA: Diagnosis not present

## 2023-07-18 DIAGNOSIS — Z794 Long term (current) use of insulin: Secondary | ICD-10-CM

## 2023-07-18 DIAGNOSIS — R2681 Unsteadiness on feet: Secondary | ICD-10-CM | POA: Diagnosis not present

## 2023-07-18 DIAGNOSIS — M6281 Muscle weakness (generalized): Secondary | ICD-10-CM

## 2023-07-18 DIAGNOSIS — H4312 Vitreous hemorrhage, left eye: Secondary | ICD-10-CM

## 2023-07-18 DIAGNOSIS — G8929 Other chronic pain: Secondary | ICD-10-CM

## 2023-07-18 DIAGNOSIS — H43813 Vitreous degeneration, bilateral: Secondary | ICD-10-CM

## 2023-07-18 DIAGNOSIS — M25561 Pain in right knee: Secondary | ICD-10-CM

## 2023-07-18 NOTE — Therapy (Signed)
OUTPATIENT PHYSICAL THERAPY TREATMENT   Patient Name: RAJVI LIUZZO MRN: [id number] DOB:Mar 30, 1951, 72 y.o., female Today's Date: 07/18/2023  PCP: No PCP on file REFERRING PROVIDER: Aldean Baker, MD     END OF SESSION:  PT End of Session - 07/18/23 1538     Visit Number 34    Number of Visits 46    Date for PT Re-Evaluation 09/07/23    Authorization Type [employer] PPO    Authorization Time Period $20 COPAY, Approved 12 PT VISITS  Authorization #[id number]  Tracking #[id number] 8/20-11/14    Authorization - Visit Number 10    Authorization - Number of Visits 12    Progress Note Due on Visit 36    PT Start Time 1534    PT Stop Time 1558    PT Time Calculation (min) 24 min    Equipment Utilized During Treatment Gait belt    Activity Tolerance Patient limited by fatigue;Patient tolerated treatment well    Behavior During Therapy WFL for tasks assessed/performed                    Past Medical History:  Diagnosis Date   Acute GI bleeding    Allergy    Anemia    Anterior chest wall pain    Appendicitis 1965   Asthma    Body mass index 37.0-37.9, adult    Breast pain    Cataract    both eyes   CHF (congestive heart failure) (HCC)    Chronic kidney disease    stage 5 - on dialysis   Cognitive change 04/20/2021   r/t cva 03/2821   Complication of anesthesia    Memory loss after general   Dehydration 2014   Deviated septum 1971   Diabetes mellitus    Dysphagia due to old stroke    easy to get strangled when eating   Dyspnea 2014   Extrinsic asthma    WITH ASTHMA ATTACK   Fibroid 1980   GERD (gastroesophageal reflux disease)    Heart murmur    History of migraine    History of seizure    with stroke   Hx gestational diabetes    Hyperlipidemia    Hypertension 2014   Inguinal hernia 1959   Malaise and fatigue 2014   Non-IgE mediated allergic asthma 2014   Obesity    Pelvic pain    Pregnancy, high-risk 1985   Stroke (HCC) 04/20/2021    (CVA) of right basal ganglia   Tonsillitis 1968   Uterine fibroid 1980   Visual field defect    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   Past Surgical History:  Procedure Laterality Date   ABDOMINAL AORTOGRAM W/LOWER EXTREMITY N/A 02/21/2022   Procedure: ABDOMINAL AORTOGRAM W/LOWER EXTREMITY;  Surgeon: Maeola Harman, MD;  Location: [facility] INVASIVE CV LAB;  Service: Cardiovascular;  Laterality: N/A;   AMPUTATION Right 05/18/2022   Procedure: RIGHT BELOW KNEE AMPUTATION;  Surgeon: Nadara Mustard, MD;  Location: [facility] OR;  Service: Orthopedics;  Laterality: Right;   AMPUTATION TOE Right 04/15/2022   Procedure: AMPUTATION  LST TOERIGHT FOOT;  Surgeon: Edwin Cap, DPM;  Location: [facility] ORS;  Service: Podiatry;  Laterality: Right;   APPENDECTOMY  1959   BASCILIC VEIN TRANSPOSITION Right 04/30/2021   Procedure: RIGHT FIRST STAGE BASCILIC VEIN TRANSPOSITION;  Surgeon: Chuck Hint, MD;  Location: [facility] OR;  Service: Vascular;  Laterality: Right;   CESAREAN SECTION  1985   COLONOSCOPY  ESOPHAGOGASTRODUODENOSCOPY (EGD) WITH PROPOFOL N/A 04/18/2021   Procedure: ESOPHAGOGASTRODUODENOSCOPY (EGD) WITH PROPOFOL;  Surgeon: Iva Boop, MD;  Location: [facility] ENDOSCOPY;  Service: Endoscopy;  Laterality: N/A;   EYE SURGERY     bilateral cataract    FLEXIBLE SIGMOIDOSCOPY N/A 04/18/2021   Procedure: FLEXIBLE SIGMOIDOSCOPY;  Surgeon: Iva Boop, MD;  Location: [facility] ENDOSCOPY;  Service: Endoscopy;  Laterality: N/A;   HERNIA REPAIR  1959   IR FLUORO GUIDE CV LINE RIGHT  03/18/2021   IR US GUIDE VASC ACCESS RIGHT  03/18/2021   LEFT HEART CATH AND CORONARY ANGIOGRAPHY N/A 04/04/2017   Procedure: Left Heart Cath and Coronary Angiography;  Surgeon: Lyn Records, MD;  Location: [facility] INVASIVE CV LAB;  Service: Cardiovascular;  Laterality: N/A;   MYOMECTOMY  1980, 2004, 2007   PERIPHERAL VASCULAR THROMBECTOMY  02/21/2022   Procedure: PERIPHERAL VASCULAR THROMBECTOMY;  Surgeon: Maeola Harman, MD;  Location:  [facility] INVASIVE CV LAB;  Service: Cardiovascular;;  Right Tibial   RHINOPLASTY  1971   ROTATOR CUFF REPAIR  2003   SURGICAL REPAIR OF HEMORRHAGE  2015   TONSILLECTOMY  1968   Patient Active Problem List   Diagnosis Date Noted   Wheelchair dependence 06/09/2023   Hx of right BKA (HCC) 03/10/2023   CHF (congestive heart failure) (HCC) 02/10/2023   Dyspnea 02/09/2023   Acute respiratory distress 02/09/2023   Urinary tract infection without hematuria    Diabetes mellitus due to underlying condition with hypoglycemia without coma, with long-term current use of insulin (HCC)    Seizure disorder (HCC) 06/22/2022   Acute respiratory failure (HCC)    Unresponsiveness    Pressure ulcer 06/07/2022   Acute stroke due to ischemia (HCC) 06/03/2022   Chest pain 06/01/2022   History of CVA (cerebrovascular accident) 06/01/2022   Seizure (HCC)    Encephalopathy    Below-knee amputation of right lower extremity (HCC)    Leukocytosis    Foot infection 05/10/2022   PAD (peripheral artery disease) (HCC) 05/10/2022   Gangrene of right foot (HCC) 04/15/2022   Left hemiparesis (HCC) 09/24/2021   Abnormality of gait 09/24/2021   Pain of right hand 08/06/2021   Steal syndrome of dialysis vascular access (HCC) 08/06/2021   Subclavian steal syndrome of right subclavian artery 06/25/2021   Right hand weakness 06/25/2021   Stroke-like symptoms 05/06/2021   Bandemia    Cerebrovascular accident (CVA) of right basal ganglia (HCC) 04/20/2021   Hemorrhoids    Basal ganglia infarction (HCC) 04/16/2021   ESRD on dialysis (HCC) 04/16/2021   Hypertensive urgency 03/10/2021   Hilar enlargement    Anemia of chronic disease    Chronic diastolic CHF (congestive heart failure) (HCC) 03/09/2021   CKD (chronic kidney disease), stage V (HCC) 03/08/2021   Diabetic retinopathy (HCC) 03/08/2021   Hyperglycemia due to type 2 diabetes mellitus (HCC) 03/08/2021   Pure hypercholesterolemia 03/08/2021   Anemia in chronic  kidney disease 09/24/2020   Uncontrolled type 2 diabetes mellitus with hyperglycemia (HCC) 03/19/2019   Osteopenia 09/04/2018   Migraine 09/04/2018   Asthma 09/04/2018   Pseudophakia of both eyes 07/17/2018   Pseudophakia of left eye 07/03/2018   Open angle with borderline findings and high glaucoma risk in left eye 07/03/2018   Age-related nuclear cataract of right eye 07/03/2018   CAD (coronary artery disease), native coronary artery 04/27/2017   Abnormal nuclear stress test 04/04/2017   Shortness of breath 03/09/2017   Appendicitis    Age-related hypermature cataract of both eyes 12/20/2016  Uterine leiomyoma 11/27/2012   Dyslipidemia (high LDL; low HDL) 11/25/2011   Obesity (BMI 30-39.9) 11/25/2011   Hypertensive disorder 11/21/2011   Type 2 diabetes mellitus (HCC) 11/21/2011   Abnormal EKG 11/21/2011    ONSET DATE: 12/29/2022 prosthesis delivery  REFERRING DIAG: Z61.096E (ICD-10-CM) - Below-knee amputation of right lower extremity   THERAPY DIAG:  Unsteadiness on feet  Other abnormalities of gait and mobility  Muscle weakness (generalized)  Hemiplegia and hemiparesis following cerebral infarction affecting left non-dominant side (HCC)  Chronic pain of both knees  Rationale for Evaluation and Treatment: Rehabilitation  SUBJECTIVE:   SUBJECTIVE STATEMENT:   She had eye injection prior to PT which made her late to PT. She can walk from bathroom to bedroom with CNA or family.     PERTINENT HISTORY: CVA / left hemiparesis due to right basal ganglia infarct 7/22, right hip bursitis, anxiety, dCHF, ESRD on HD, HTN, DM, CAD  PAIN:  Are you having pain?  Yes: NPRS scale:    reports no pain in knee or back sitting, but transition with standing with toilet 4/10 at times Pain location: Rt knee, back Pain description: throbbing Aggravating factors: standing, walking Relieving factors: Voltarin & massage  PRECAUTIONS: Fall and Other: NO BP RUE  WEIGHT BEARING  RESTRICTIONS: No  FALLS: Has patient fallen in last 6 months? No  LIVING ENVIRONMENT: Lives with: lives with their spouse and CNAs 24 hrs 7 days / wk Lives in: House Home Access: Ramped entrance Home layout: Multi level, Full bath on main level, and Able to live on main level with bedroom and bathroom Stairs: Yes: Internal: 16 steps; on left going up and External: 4 steps; on right going up Has following equipment at home: Single point cane, Walker - 2 wheeled, Wheelchair (manual), and shower chair  PLOF: Independent with household mobility with device and Independent with community mobility with device  PATIENT GOALS: walk with prosthesis, stand & transfer.   OBJECTIVE:  COGNITION: Overall cognitive status: History of cognitive impairments - at baseline  POSTURE: rounded shoulders, forward head, decreased lumbar lordosis, increased thoracic kyphosis, and flexed trunk   LOWER EXTREMITY ROM: Eval / 01/05/2023:    BLEs PROM WFL except unable to assess hip ext  (Blank rows = not tested)  LOWER EXTREMITY MMT:  MMT Right eval Right 03/06/23  Hip flexion 3-/5 3-/5  Hip extension 3-/5 3-/5  Hip abduction 3-/5 3-/5  Hip adduction    Hip internal rotation    Hip external rotation    Knee flexion 3-/5 3-/5  Knee extension 3-/5 3-/5  Ankle dorsiflexion NA NA  Ankle plantarflexion NA NA  Ankle inversion NA NA  Ankle eversion NA NA  03/06/2023: LLE hemiparesis but able to advance during gait without assistance.   Eval / 01/05/2023: left LE & UE hemiparesis.  In gait slides to advance LLE and knee / hip instability in stance.  (Blank rows = not tested)  TRANSFERS: 06/13/2023: Sit to stand w/c & 18" chair with armrests to RW with modA. Stand to sit with minA. Stand-pivot transfer with RW with minA once pt standing.   05/09/23 Sit to stand w/c & 18" chair with armrests to RW with modA. Stand to sit with minA. Stand-pivot  transfer with RW with modA / constant cueing. Stand-pivot  transfer holding onto PT mod A  03/06/2023:  Sit to stand w/c & 18" chair with armrests to RW with modA. Stand to sit with minA. Stand-pivot  transfer with RW with modA /  constant cueing.  02/07/2023: Sit to stand w/c or 18" chair with armrests to RW with modA. Stand to sit with minA. Both require constant cues. Stand-pivot transfer with RW with modA / constant cueing. (2nd person for safety)   01/05/2023 / Eval: Sit to stand: Max A pulling BUEs on //bars from w/c Stand to sit: Min A using BUEs //bars to control descent Lateral scoot transfers: Mod A w/c to / from mat table level & adjacent   FUNCTIONAL TESTs:  06/13/2023: PT manually cues weight shift over feet with modA initially then pt able to stand 2 min with RW support with close supervision.   03/06/2023: static stance with RW support 60 sec with minA.  Eval / 01/05/2023:  Standing in //bars for 30 sec with modA.   GAIT: 06/13/2023: Pt amb 25' with RW with minA once pt standing.  Pt required w/c following so she can sit immediately upon fatiguing.   03/06/2023: Pt amb 5' with RW with +2 modA including 90* turn to chair.  02/07/2023: Pt amb 5' with RW with +2 modA including 90* turn to chair.   01/05/2023 / Eval: Gait pattern: step to pattern, decreased step length- Right, decreased step length- Left, decreased stance time- Right, decreased stance time- Left, decreased hip/knee flexion- Left, decreased ankle dorsiflexion- Left, Right hip hike, Left hip hike, knee flexed in stance- Left, antalgic, lateral hip instability, trunk flexed, wide BOS, and poor foot clearance- Left Distance walked: 3' Assistive device utilized: // bars & RLE TTA prosthesis Level of assistance: Total A / 2 person Comments:  LLE with poor clearance / slides foot forward and knee / hip instability in stance.   CURRENT PROSTHETIC WEAR ASSESSMENT: 06/13/2023: pt & husband report pt wearing prosthesis all awake hours except during dialysis and ~ 1 hour when her  limb starts to throb.  Pt & husband report proper limb care, don/doff and cleaning prosthesis. They require intermittent PT instruction for adjusting ply socks and weight bearing.   03/06/2023: Patient's husband & CNA correctly don & doff her prosthesis. Pt is dependent requiring maxA to don or doff.  She reports wearing prosthesis 5 hrs 2x/day for 10 hours total on non-dialysis days and up to 8 hours on dialysis days.  Her limb has scab on lateral incision that appears to be internal suture working way out. No signs of infection. Her residual limb appearance is dry skin, normal temperature, limited to no hair growth, cylindrical shape.   Eval / 01/05/2023:  Patient is dependent with: skin check, residual limb care, care of non-amputated limb, prosthetic cleaning, ply sock cleaning, correct ply sock adjustment, proper wear schedule/adjustment, and proper weight-bearing schedule/adjustment Donning prosthesis: Total A / family & CNA requires 100% cueing Doffing prosthesis: Total A / family & CNA requires 100% cueing Prosthetic wear tolerance: 1-1.5 hours, 1x/day, 6 of 7 days since delivery Prosthetic weight bearing tolerance: 3 minutes during eval with c/o knee pain Edema: pitting Residual limb condition: lateral incision with 3mm X 6mm long scabs with serous drainage, arrived with bandaid over wound,  dry skin, normal temperature & color, cylindrical shape Prosthetic description: silicon liner with pin lock suspension, total contact socket, SACH foot K code/activity level with prosthetic use: Level 1    TODAY'S TREATMENT:  DATE: 07/18/2023: Prosthetic Training with TTA prosthesis: PT demo and verbal cues on left hand position for sit to from stand with hand splint on rolling walker.  Patient able to perform sit to/from stand with min assist and verbal cues for the the hand  placement. Patient ambulated 84' & 25' with RW with left hand splint with minA.  Patient reporting that the hand splint rubs the backside of her hand but PT did not note any contact with that area of the splint. PT instructed patient and husband and how to purchase the hand splint for her walker at home.  Her husband verbalized understanding.  07/11/2023: Prosthetic Training with TTA prosthesis Sit to stand with RW and patient pushing off wheelchair with min to mod assist, requires cues for set up, hand placement, and sequence, X 5 reps total. Patient ambulated 30 feet X 2 with min A to CGA. No w/c following.  Stand pivot transfer wheelchair to standard chair min A, Cues on fully turning feet to square up with chair prior to sitting.   Therapeutic Exercise Nu step seat 10 L6 X 8 min UE/LE for functional endurance Leg press DL 37# [id number], then SL 69# [id number] each side Pulleys 2.5 min for shoulder ROM and gripping   07/06/2023: Prosthetic Training with TTA prosthesis Sit to stand with RW and patient pushing off wheelchair with min to mod assist, requires cues for set up, hand placement, and sequence, X 5 reps total. Patient ambulated 30 feet X 2 with min A to CGA. No w/c following.  Stand pivot transfer wheelchair to standard chair min A, Cues on fully turning feet to square up with chair prior to sitting.   Therapeutic Exercise Nu step seat 10 L6 X 10 min UE/LE for functional endurance Leg press DL 62# [id number], then SL 00# [id number] each side Pulleys 2.5 min for shoulder ROM and gripping   HOME EXERCISE PROGRAM: Discussed walking program with aide at home, frequent short walks  ASSESSMENT:  CLINICAL IMPRESSION: Patient improved sit to/from stand by placing left hand behind the hand splint for the actual transition for sit to/from stand.  Patient's left hand appears to be more functional with the hand splint in place then tried to hold onto the walker due to hemiparesis from her prior  CVA.  OBJECTIVE IMPAIRMENTS: Abnormal gait, decreased activity tolerance, decreased balance, decreased cognition, decreased coordination, decreased endurance, decreased knowledge of condition, decreased knowledge of use of DME, decreased mobility, difficulty walking, decreased ROM, decreased strength, postural dysfunction, prosthetic dependency , obesity, and pain.   ACTIVITY LIMITATIONS: standing, stairs, transfers, locomotion level, and safe prosthesis use.  PARTICIPATION LIMITATIONS: community activity, church, and household mobility  PERSONAL FACTORS: Age, Fitness, Past/current experiences, Time since onset of injury/illness/exacerbation, and 3+ comorbidities: see PMH  are also affecting patient's functional outcome.   REHAB POTENTIAL: Good  CLINICAL DECISION MAKING: Evolving/moderate complexity  EVALUATION COMPLEXITY: Moderate   GOALS: Goals reviewed with patient? Yes  SHORT TERM GOALS: Target date: 07/14/2023  Patient reports transferring out of w/c >/= 2x/day on non-dialysis days with CNA or family assistance.  Baseline: SEE OBJECTIVE DATA Goal status: Met 07/18/2023 2.  Patient reports knee pain <4/10.  Baseline: SEE OBJECTIVE DATA Goal status:  MET 07/18/2023  3.  Patient sit to/from stand modA. Baseline: SEE OBJECTIVE DATA Goal status: MET 07/11/2023  4. Patient ambulates 74' between 2 chairs (without w/c following pt) with minA. Baseline: SEE OBJECTIVE DATA Goal status: MET 07/11/2023    LONG TERM GOALS: Target  date: 09/07/2023  Patient & family / CNA demonstrates & verbalized understanding of prosthetic care to enable safe utilization of prosthesis. Baseline: SEE OBJECTIVE DATA Goal status: Ongoing 07/18/2023  Patient tolerates prosthesis wear >90% of awake hours without skin or limb pain issues and knee pain </= 4/10.  Baseline: SEE OBJECTIVE DATA Goal status:  Ongoing 07/18/2023  Stand balance with walker: static 2 min with supervision to enable ability for  standing ADLS. Baseline: SEE OBJECTIVE DATA Goal status: Ongoing 07/18/2023  Patient ambulates >50' with RW & prosthesis only with family / CNA assist safely.  Baseline: SEE OBJECTIVE DATA Goal status: Ongoing 07/18/2023  Patient transfers stand pivot including sit to/from stand with RW with minA or less.  Baseline: SEE OBJECTIVE DATA Goal status:  Ongoing 07/18/2023   PLAN:  PT FREQUENCY: 1-2x/week  PT DURATION: 12 weeks / 90 days  PLANNED INTERVENTIONS: Therapeutic exercises, Therapeutic activity, Neuromuscular re-education, Balance training, Gait training, Patient/Family education, Self Care, Vestibular training, Prosthetic training, DME instructions, and Manual therapy  PLAN FOR NEXT SESSION: Check if they purchased wrist splint for RW.  Work towards [employer] continue Work on patient walking between [employer] and decreasing need for wheelchair to be followed behind patient.  Continue gait and transfers including car transfers when car available. stand pivot with rolling walker, leg strength, balance  Vladimir Faster, PT, DPT 07/18/2023, 5:13 PM

## 2023-07-20 ENCOUNTER — Ambulatory Visit: Payer: Medicare PPO | Admitting: Physical Therapy

## 2023-07-20 ENCOUNTER — Encounter: Payer: Self-pay | Admitting: Physical Therapy

## 2023-07-20 DIAGNOSIS — M25561 Pain in right knee: Secondary | ICD-10-CM

## 2023-07-20 DIAGNOSIS — M6281 Muscle weakness (generalized): Secondary | ICD-10-CM | POA: Diagnosis not present

## 2023-07-20 DIAGNOSIS — M25562 Pain in left knee: Secondary | ICD-10-CM

## 2023-07-20 DIAGNOSIS — I69354 Hemiplegia and hemiparesis following cerebral infarction affecting left non-dominant side: Secondary | ICD-10-CM

## 2023-07-20 DIAGNOSIS — G8929 Other chronic pain: Secondary | ICD-10-CM

## 2023-07-20 DIAGNOSIS — R2689 Other abnormalities of gait and mobility: Secondary | ICD-10-CM | POA: Diagnosis not present

## 2023-07-20 DIAGNOSIS — R2681 Unsteadiness on feet: Secondary | ICD-10-CM

## 2023-07-20 NOTE — Therapy (Signed)
OUTPATIENT PHYSICAL THERAPY TREATMENT   Patient Name: Carolyn Russo MRN: [id number] DOB:1951-04-21, 72 y.o., female Today's Date: 07/20/2023  PCP: No PCP on file REFERRING PROVIDER: Aldean Baker, MD     END OF SESSION:  PT End of Session - 07/20/23 1458     Visit Number 35    Number of Visits 46    Date for PT Re-Evaluation 09/07/23    Authorization Type [employer] PPO    Authorization Time Period $20 COPAY, Approved 12 PT VISITS  Authorization #[id number]  Tracking #[id number] 8/20-11/14    Authorization - Visit Number 11    Authorization - Number of Visits 12    Progress Note Due on Visit 36    PT Start Time 1432    PT Stop Time 1517    PT Time Calculation (min) 45 min    Equipment Utilized During Treatment Gait belt    Activity Tolerance Patient limited by fatigue;Patient tolerated treatment well    Behavior During Therapy WFL for tasks assessed/performed                    Past Medical History:  Diagnosis Date   Acute GI bleeding    Allergy    Anemia    Anterior chest wall pain    Appendicitis 1965   Asthma    Body mass index 37.0-37.9, adult    Breast pain    Cataract    both eyes   CHF (congestive heart failure) (HCC)    Chronic kidney disease    stage 5 - on dialysis   Cognitive change 04/20/2021   r/t cva 03/2821   Complication of anesthesia    Memory loss after general   Dehydration 2014   Deviated septum 1971   Diabetes mellitus    Dysphagia due to old stroke    easy to get strangled when eating   Dyspnea 2014   Extrinsic asthma    WITH ASTHMA ATTACK   Fibroid 1980   GERD (gastroesophageal reflux disease)    Heart murmur    History of migraine    History of seizure    with stroke   Hx gestational diabetes    Hyperlipidemia    Hypertension 2014   Inguinal hernia 1959   Malaise and fatigue 2014   Non-IgE mediated allergic asthma 2014   Obesity    Pelvic pain    Pregnancy, high-risk 1985   Stroke (HCC) 04/20/2021    (CVA) of right basal ganglia   Tonsillitis 1968   Uterine fibroid 1980   Visual field defect    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   Past Surgical History:  Procedure Laterality Date   ABDOMINAL AORTOGRAM W/LOWER EXTREMITY N/A 02/21/2022   Procedure: ABDOMINAL AORTOGRAM W/LOWER EXTREMITY;  Surgeon: Maeola Harman, MD;  Location[facility] INVASIVE CV LAB;  Service: Cardiovascular;  Laterality: N/A;   AMPUTATION Right 05/18/2022   Procedure: RIGHT BELOW KNEE AMPUTATION;  Surgeon: Nadara Mustard, MD;  Location: [facility] OR;  Service: Orthopedics;  Laterality: Right;   AMPUTATION TOE Right 04/15/2022   Procedure: AMPUTATION  LST TOERIGHT FOOT;  Surgeon: Edwin Cap, DPM;  Location: [facility] ORS;  Service: Podiatry;  Laterality: Right;   APPENDECTOMY  1959   BASCILIC VEIN TRANSPOSITION Right 04/30/2021   Procedure: RIGHT FIRST STAGE BASCILIC VEIN TRANSPOSITION;  Surgeon: Chuck Hint, MD;  Location: [facility] OR;  Service: Vascular;  Laterality: Right;   CESAREAN SECTION  1985   COLONOSCOPY  ESOPHAGOGASTRODUODENOSCOPY (EGD) WITH PROPOFOL N/A 04/18/2021   Procedure: ESOPHAGOGASTRODUODENOSCOPY (EGD) WITH PROPOFOL;  Surgeon: Iva Boop, MD;  Location: [facility] ENDOSCOPY;  Service: Endoscopy;  Laterality: N/A;   EYE SURGERY     bilateral cataract    FLEXIBLE SIGMOIDOSCOPY N/A 04/18/2021   Procedure: FLEXIBLE SIGMOIDOSCOPY;  Surgeon: Iva Boop, MD;  Location: [facility] ENDOSCOPY;  Service: Endoscopy;  Laterality: N/A;   HERNIA REPAIR  1959   IR FLUORO GUIDE CV LINE RIGHT  03/18/2021   IR US GUIDE VASC ACCESS RIGHT  03/18/2021   LEFT HEART CATH AND CORONARY ANGIOGRAPHY N/A 04/04/2017   Procedure: Left Heart Cath and Coronary Angiography;  Surgeon: Lyn Records, MD;  Location: [facility] INVASIVE CV LAB;  Service: Cardiovascular;  Laterality: N/A;   MYOMECTOMY  1980, 2004, 2007   PERIPHERAL VASCULAR THROMBECTOMY  02/21/2022   Procedure: PERIPHERAL VASCULAR THROMBECTOMY;  Surgeon: Maeola Harman, MD;  Location:  [facility] INVASIVE CV LAB;  Service: Cardiovascular;;  Right Tibial   RHINOPLASTY  1971   ROTATOR CUFF REPAIR  2003   SURGICAL REPAIR OF HEMORRHAGE  2015   TONSILLECTOMY  1968   Patient Active Problem List   Diagnosis Date Noted   Wheelchair dependence 06/09/2023   Hx of right BKA (HCC) 03/10/2023   CHF (congestive heart failure) (HCC) 02/10/2023   Dyspnea 02/09/2023   Acute respiratory distress 02/09/2023   Urinary tract infection without hematuria    Diabetes mellitus due to underlying condition with hypoglycemia without coma, with long-term current use of insulin (HCC)    Seizure disorder (HCC) 06/22/2022   Acute respiratory failure (HCC)    Unresponsiveness    Pressure ulcer 06/07/2022   Acute stroke due to ischemia (HCC) 06/03/2022   Chest pain 06/01/2022   History of CVA (cerebrovascular accident) 06/01/2022   Seizure (HCC)    Encephalopathy    Below-knee amputation of right lower extremity (HCC)    Leukocytosis    Foot infection 05/10/2022   PAD (peripheral artery disease) (HCC) 05/10/2022   Gangrene of right foot (HCC) 04/15/2022   Left hemiparesis (HCC) 09/24/2021   Abnormality of gait 09/24/2021   Pain of right hand 08/06/2021   Steal syndrome of dialysis vascular access (HCC) 08/06/2021   Subclavian steal syndrome of right subclavian artery 06/25/2021   Right hand weakness 06/25/2021   Stroke-like symptoms 05/06/2021   Bandemia    Cerebrovascular accident (CVA) of right basal ganglia (HCC) 04/20/2021   Hemorrhoids    Basal ganglia infarction (HCC) 04/16/2021   ESRD on dialysis (HCC) 04/16/2021   Hypertensive urgency 03/10/2021   Hilar enlargement    Anemia of chronic disease    Chronic diastolic CHF (congestive heart failure) (HCC) 03/09/2021   CKD (chronic kidney disease), stage V (HCC) 03/08/2021   Diabetic retinopathy (HCC) 03/08/2021   Hyperglycemia due to type 2 diabetes mellitus (HCC) 03/08/2021   Pure hypercholesterolemia 03/08/2021   Anemia in chronic  kidney disease 09/24/2020   Uncontrolled type 2 diabetes mellitus with hyperglycemia (HCC) 03/19/2019   Osteopenia 09/04/2018   Migraine 09/04/2018   Asthma 09/04/2018   Pseudophakia of both eyes 07/17/2018   Pseudophakia of left eye 07/03/2018   Open angle with borderline findings and high glaucoma risk in left eye 07/03/2018   Age-related nuclear cataract of right eye 07/03/2018   CAD (coronary artery disease), native coronary artery 04/27/2017   Abnormal nuclear stress test 04/04/2017   Shortness of breath 03/09/2017   Appendicitis    Age-related hypermature cataract of both eyes 12/20/2016  Uterine leiomyoma 11/27/2012   Dyslipidemia (high LDL; low HDL) 11/25/2011   Obesity (BMI 30-39.9) 11/25/2011   Hypertensive disorder 11/21/2011   Type 2 diabetes mellitus (HCC) 11/21/2011   Abnormal EKG 11/21/2011    ONSET DATE: 12/29/2022 prosthesis delivery  REFERRING DIAG: Z61.096E (ICD-10-CM) - Below-knee amputation of right lower extremity   THERAPY DIAG:  Unsteadiness on feet  Other abnormalities of gait and mobility  Muscle weakness (generalized)  Hemiplegia and hemiparesis following cerebral infarction affecting left non-dominant side (HCC)  Chronic pain of both knees  Rationale for Evaluation and Treatment: Rehabilitation  SUBJECTIVE:   SUBJECTIVE STATEMENT:   She endorses some bilat knee pain, bilat wrist pain and back pain with walking, not really hurting her otherwise  PERTINENT HISTORY: CVA / left hemiparesis due to right basal ganglia infarct 7/22, right hip bursitis, anxiety, dCHF, ESRD on HD, HTN, DM, CAD  PAIN:  Are you having pain?  Yes: NPRS scale:    reports no pain in knee or back sitting, but transition with standing with toilet 4/10 at times Pain location: Rt knee, back Pain description: throbbing Aggravating factors: standing, walking Relieving factors: Voltarin & massage  PRECAUTIONS: Fall and Other: NO BP RUE  WEIGHT BEARING RESTRICTIONS:  No  FALLS: Has patient fallen in last 6 months? No  LIVING ENVIRONMENT: Lives with: lives with their spouse and CNAs 24 hrs 7 days / wk Lives in: House Home Access: Ramped entrance Home layout: Multi level, Full bath on main level, and Able to live on main level with bedroom and bathroom Stairs: Yes: Internal: 16 steps; on left going up and External: 4 steps; on right going up Has following equipment at home: Single point cane, Walker - 2 wheeled, Wheelchair (manual), and shower chair  PLOF: Independent with household mobility with device and Independent with community mobility with device  PATIENT GOALS: walk with prosthesis, stand & transfer.   OBJECTIVE:  COGNITION: Overall cognitive status: History of cognitive impairments - at baseline  POSTURE: rounded shoulders, forward head, decreased lumbar lordosis, increased thoracic kyphosis, and flexed trunk   LOWER EXTREMITY ROM: Eval / 01/05/2023:    BLEs PROM WFL except unable to assess hip ext  (Blank rows = not tested)  LOWER EXTREMITY MMT:  MMT Right eval Right 03/06/23  Hip flexion 3-/5 3-/5  Hip extension 3-/5 3-/5  Hip abduction 3-/5 3-/5  Hip adduction    Hip internal rotation    Hip external rotation    Knee flexion 3-/5 3-/5  Knee extension 3-/5 3-/5  Ankle dorsiflexion NA NA  Ankle plantarflexion NA NA  Ankle inversion NA NA  Ankle eversion NA NA  03/06/2023: LLE hemiparesis but able to advance during gait without assistance.   Eval / 01/05/2023: left LE & UE hemiparesis.  In gait slides to advance LLE and knee / hip instability in stance.  (Blank rows = not tested)  TRANSFERS: 06/13/2023: Sit to stand w/c & 18" chair with armrests to RW with modA. Stand to sit with minA. Stand-pivot transfer with RW with minA once pt standing.   05/09/23 Sit to stand w/c & 18" chair with armrests to RW with modA. Stand to sit with minA. Stand-pivot  transfer with RW with modA / constant cueing. Stand-pivot transfer holding  onto PT mod A  03/06/2023:  Sit to stand w/c & 18" chair with armrests to RW with modA. Stand to sit with minA. Stand-pivot  transfer with RW with modA / constant cueing.  02/07/2023: Sit to stand  w/c or 18" chair with armrests to RW with modA. Stand to sit with minA. Both require constant cues. Stand-pivot transfer with RW with modA / constant cueing. (2nd person for safety)   01/05/2023 / Eval: Sit to stand: Max A pulling BUEs on //bars from w/c Stand to sit: Min A using BUEs //bars to control descent Lateral scoot transfers: Mod A w/c to / from mat table level & adjacent   FUNCTIONAL TESTs:  06/13/2023: PT manually cues weight shift over feet with modA initially then pt able to stand 2 min with RW support with close supervision.   03/06/2023: static stance with RW support 60 sec with minA.  Eval / 01/05/2023:  Standing in //bars for 30 sec with modA.   GAIT: 06/13/2023: Pt amb 25' with RW with minA once pt standing.  Pt required w/c following so she can sit immediately upon fatiguing.   03/06/2023: Pt amb 5' with RW with +2 modA including 90* turn to chair.  02/07/2023: Pt amb 5' with RW with +2 modA including 90* turn to chair.   01/05/2023 / Eval: Gait pattern: step to pattern, decreased step length- Right, decreased step length- Left, decreased stance time- Right, decreased stance time- Left, decreased hip/knee flexion- Left, decreased ankle dorsiflexion- Left, Right hip hike, Left hip hike, knee flexed in stance- Left, antalgic, lateral hip instability, trunk flexed, wide BOS, and poor foot clearance- Left Distance walked: 3' Assistive device utilized: // bars & RLE TTA prosthesis Level of assistance: Total A / 2 person Comments:  LLE with poor clearance / slides foot forward and knee / hip instability in stance.   CURRENT PROSTHETIC WEAR ASSESSMENT: 06/13/2023: pt & husband report pt wearing prosthesis all awake hours except during dialysis and ~ 1 hour when her limb starts to  throb.  Pt & husband report proper limb care, don/doff and cleaning prosthesis. They require intermittent PT instruction for adjusting ply socks and weight bearing.   03/06/2023: Patient's husband & CNA correctly don & doff her prosthesis. Pt is dependent requiring maxA to don or doff.  She reports wearing prosthesis 5 hrs 2x/day for 10 hours total on non-dialysis days and up to 8 hours on dialysis days.  Her limb has scab on lateral incision that appears to be internal suture working way out. No signs of infection. Her residual limb appearance is dry skin, normal temperature, limited to no hair growth, cylindrical shape.   Eval / 01/05/2023:  Patient is dependent with: skin check, residual limb care, care of non-amputated limb, prosthetic cleaning, ply sock cleaning, correct ply sock adjustment, proper wear schedule/adjustment, and proper weight-bearing schedule/adjustment Donning prosthesis: Total A / family & CNA requires 100% cueing Doffing prosthesis: Total A / family & CNA requires 100% cueing Prosthetic wear tolerance: 1-1.5 hours, 1x/day, 6 of 7 days since delivery Prosthetic weight bearing tolerance: 3 minutes during eval with c/o knee pain Edema: pitting Residual limb condition: lateral incision with 3mm X 6mm long scabs with serous drainage, arrived with bandaid over wound,  dry skin, normal temperature & color, cylindrical shape Prosthetic description: silicon liner with pin lock suspension, total contact socket, SACH foot K code/activity level with prosthetic use: Level 1    TODAY'S TREATMENT:  DATE: 07/20/2023: Prosthetic Training with TTA prosthesis: Sit to stand with RW and patient pushing off wheelchair with min to mod assist, requires cues for set up, hand placement, and sequence, X 5 reps total. Patient ambulated 30 feet X 2 with min A to CGA. No w/c  following and trial of wrist wrap support versus wrist splint on walker.  Stand pivot transfer wheelchair to standard chair min A X 2, Cues on fully turning feet to square up with chair prior to sitting.   Therapeutic Exercise Nu step seat 10 L6 X 5 min UE/LE for functional endurance Leg press DL 84# [id number], then SL 95# [id number] each side Pulleys 2.5 min for shoulder ROM and gripping Prayer stretch to improve wrist extension ROM 10 sec X 5  07/18/2023: Prosthetic Training with TTA prosthesis: PT demo and verbal cues on left hand position for sit to from stand with hand splint on rolling walker.  Patient able to perform sit to/from stand with min assist and verbal cues for the the hand placement. Patient ambulated 67' & 25' with RW with left hand splint with minA.  Patient reporting that the hand splint rubs the backside of her hand but PT did not note any contact with that area of the splint. PT instructed patient and husband and how to purchase the hand splint for her walker at home.  Her husband verbalized understanding.  07/11/2023: Prosthetic Training with TTA prosthesis Sit to stand with RW and patient pushing off wheelchair with min to mod assist, requires cues for set up, hand placement, and sequence, X 5 reps total. Patient ambulated 30 feet X 2 with min A to CGA. No w/c following.  Stand pivot transfer wheelchair to standard chair min A, Cues on fully turning feet to square up with chair prior to sitting.   Therapeutic Exercise Nu step seat 10 L6 X 8 min UE/LE for functional endurance Leg press DL 32# [id number], then SL 02# [id number] each side Pulleys 2.5 min for shoulder ROM and gripping   07/06/2023: Prosthetic Training with TTA prosthesis Sit to stand with RW and patient pushing off wheelchair with min to mod assist, requires cues for set up, hand placement, and sequence, X 5 reps total. Patient ambulated 30 feet X 2 with min A to CGA. No w/c following.  Stand pivot transfer wheelchair to  standard chair min A, Cues on fully turning feet to square up with chair prior to sitting.   Therapeutic Exercise Nu step seat 10 L6 X 10 min UE/LE for functional endurance Leg press DL 66# 4Q03, then SL 47# 4Q59 each side Pulleys 2.5 min for shoulder ROM and gripping   HOME EXERCISE PROGRAM: Discussed walking program with aide at home, frequent short walks  ASSESSMENT:  CLINICAL IMPRESSION: PT had OT hand specialist check out her wrist during standing and walking with RW. He feels that she has good enough grip to hold walker and did not recommend she use wrist splint. Instead he felt like a wrist wrap would be better for her and provided her with one. We used this during session today during ambulation and she did feel like it helped.   OBJECTIVE IMPAIRMENTS: Abnormal gait, decreased activity tolerance, decreased balance, decreased cognition, decreased coordination, decreased endurance, decreased knowledge of condition, decreased knowledge of use of DME, decreased mobility, difficulty walking, decreased ROM, decreased strength, postural dysfunction, prosthetic dependency , obesity, and pain.   ACTIVITY LIMITATIONS: standing, stairs, transfers, locomotion level, and safe prosthesis use.  PARTICIPATION LIMITATIONS:  community activity, church, and household mobility  PERSONAL FACTORS: Age, Fitness, Past/current experiences, Time since onset of injury/illness/exacerbation, and 3+ comorbidities: see PMH  are also affecting patient's functional outcome.   REHAB POTENTIAL: Good  CLINICAL DECISION MAKING: Evolving/moderate complexity  EVALUATION COMPLEXITY: Moderate   GOALS: Goals reviewed with patient? Yes  SHORT TERM GOALS: Target date: 07/14/2023  Patient reports transferring out of w/c >/= 2x/day on non-dialysis days with CNA or family assistance.  Baseline: SEE OBJECTIVE DATA Goal status: Met 07/18/2023 2.  Patient reports knee pain <4/10.  Baseline: SEE OBJECTIVE DATA Goal  status:  MET 07/18/2023  3.  Patient sit to/from stand modA. Baseline: SEE OBJECTIVE DATA Goal status: MET 07/11/2023  4. Patient ambulates 49' between 2 chairs (without w/c following pt) with minA. Baseline: SEE OBJECTIVE DATA Goal status: MET 07/11/2023    LONG TERM GOALS: Target date: 09/07/2023  Patient & family / CNA demonstrates & verbalized understanding of prosthetic care to enable safe utilization of prosthesis. Baseline: SEE OBJECTIVE DATA Goal status: Ongoing 07/18/2023  Patient tolerates prosthesis wear >90% of awake hours without skin or limb pain issues and knee pain </= 4/10.  Baseline: SEE OBJECTIVE DATA Goal status:  Ongoing 07/18/2023  Stand balance with walker: static 2 min with supervision to enable ability for standing ADLS. Baseline: SEE OBJECTIVE DATA Goal status: Ongoing 07/18/2023  Patient ambulates >50' with RW & prosthesis only with family / CNA assist safely.  Baseline: SEE OBJECTIVE DATA Goal status: Ongoing 07/18/2023  Patient transfers stand pivot including sit to/from stand with RW with minA or less.  Baseline: SEE OBJECTIVE DATA Goal status:  Ongoing 07/18/2023   PLAN:  PT FREQUENCY: 1-2x/week  PT DURATION: 12 weeks / 90 days  PLANNED INTERVENTIONS: Therapeutic exercises, Therapeutic activity, Neuromuscular re-education, Balance training, Gait training, Patient/Family education, Self Care, Vestibular training, Prosthetic training, DME instructions, and Manual therapy  PLAN FOR NEXT SESSION:  Work towards LTG's. continue Work on patient walking between 2 chairs and decreasing need for wheelchair to be followed behind patient.  Continue gait and transfers including car transfers when car available. stand pivot with rolling walker, leg strength, balance  April Manson, PT, DPT 07/20/2023, 3:14 PM

## 2023-07-25 ENCOUNTER — Ambulatory Visit: Payer: Medicare PPO | Admitting: Physical Therapy

## 2023-07-25 ENCOUNTER — Encounter: Payer: Self-pay | Admitting: Physical Therapy

## 2023-07-25 DIAGNOSIS — M25562 Pain in left knee: Secondary | ICD-10-CM

## 2023-07-25 DIAGNOSIS — I69354 Hemiplegia and hemiparesis following cerebral infarction affecting left non-dominant side: Secondary | ICD-10-CM | POA: Diagnosis not present

## 2023-07-25 DIAGNOSIS — M6281 Muscle weakness (generalized): Secondary | ICD-10-CM

## 2023-07-25 DIAGNOSIS — R2681 Unsteadiness on feet: Secondary | ICD-10-CM

## 2023-07-25 DIAGNOSIS — R2689 Other abnormalities of gait and mobility: Secondary | ICD-10-CM

## 2023-07-25 DIAGNOSIS — G8929 Other chronic pain: Secondary | ICD-10-CM

## 2023-07-25 DIAGNOSIS — M25561 Pain in right knee: Secondary | ICD-10-CM

## 2023-07-25 NOTE — Therapy (Signed)
OUTPATIENT PHYSICAL THERAPY TREATMENT Progress Note reporting period date 06/13/23 to 07/25/23  See below for objective and subjective measurements relating to patients progress with PT.    Patient Name: Carolyn Russo MRN: [id number] DOB:May 15, 1951, 72 y.o., female Today's Date: 07/25/2023  PCP: No PCP on file REFERRING PROVIDER: Aldean Baker, MD     END OF SESSION:  PT End of Session - 07/25/23 1708     Visit Number 36    Number of Visits 46    Date for PT Re-Evaluation 09/07/23    Authorization Type [employer] PPO    Authorization Time Period $20 COPAY, Approved 12 PT VISITS  Authorization #[id number]  Tracking #[id number] 8/20-11/14    Authorization - Visit Number 11   updated to correct miscounted, 9th visit in this authorization was skipped and documented as 10   Authorization - Number of Visits 12    Progress Note Due on Visit 36    PT Start Time 1600    PT Stop Time 1650    PT Time Calculation (min) 50 min    Equipment Utilized During Treatment Gait belt    Activity Tolerance Patient limited by fatigue;Patient tolerated treatment well    Behavior During Therapy WFL for tasks assessed/performed                    Past Medical History:  Diagnosis Date   Acute GI bleeding    Allergy    Anemia    Anterior chest wall pain    Appendicitis 1965   Asthma    Body mass index 37.0-37.9, adult    Breast pain    Cataract    both eyes   CHF (congestive heart failure) (HCC)    Chronic kidney disease    stage 5 - on dialysis   Cognitive change 04/20/2021   r/t cva 03/2821   Complication of anesthesia    Memory loss after general   Dehydration 2014   Deviated septum 1971   Diabetes mellitus    Dysphagia due to old stroke    easy to get strangled when eating   Dyspnea 2014   Extrinsic asthma    WITH ASTHMA ATTACK   Fibroid 1980   GERD (gastroesophageal reflux disease)    Heart murmur    History of migraine    History of seizure    with  stroke   Hx gestational diabetes    Hyperlipidemia    Hypertension 2014   Inguinal hernia 1959   Malaise and fatigue 2014   Non-IgE mediated allergic asthma 2014   Obesity    Pelvic pain    Pregnancy, high-risk 1985   Stroke (HCC) 04/20/2021   (CVA) of right basal ganglia   Tonsillitis 1968   Uterine fibroid 1980   Visual field defect    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   Past Surgical History:  Procedure Laterality Date   ABDOMINAL AORTOGRAM W/LOWER EXTREMITY N/A 02/21/2022   Procedure: ABDOMINAL AORTOGRAM W/LOWER EXTREMITY;  Surgeon: Maeola Harman, MD;  Location: [facility] INVASIVE CV LAB;  Service: Cardiovascular;  Laterality: N/A;   AMPUTATION Right 05/18/2022   Procedure: RIGHT BELOW KNEE AMPUTATION;  Surgeon: Nadara Mustard, MD;  Location: [facility] OR;  Service: Orthopedics;  Laterality: Right;   AMPUTATION TOE Right 04/15/2022   Procedure: AMPUTATION  LST TOERIGHT FOOT;  Surgeon: Edwin Cap, DPM;  Location: [facility] ORS;  Service: Podiatry;  Laterality: Right;   APPENDECTOMY  1959   BASCILIC VEIN TRANSPOSITION  Right 04/30/2021   Procedure: RIGHT FIRST STAGE BASCILIC VEIN TRANSPOSITION;  Surgeon: Chuck Hint, MD;  Location: [facility] OR;  Service: Vascular;  Laterality: Right;   CESAREAN SECTION  1985   COLONOSCOPY     ESOPHAGOGASTRODUODENOSCOPY (EGD) WITH PROPOFOL N/A 04/18/2021   Procedure: ESOPHAGOGASTRODUODENOSCOPY (EGD) WITH PROPOFOL;  Surgeon: Iva Boop, MD;  Location: [facility] ENDOSCOPY;  Service: Endoscopy;  Laterality: N/A;   EYE SURGERY     bilateral cataract    FLEXIBLE SIGMOIDOSCOPY N/A 04/18/2021   Procedure: FLEXIBLE SIGMOIDOSCOPY;  Surgeon: Iva Boop, MD;  Location[facility] ENDOSCOPY;  Service: Endoscopy;  Laterality: N/A;   HERNIA REPAIR  1959   IR FLUORO GUIDE CV LINE RIGHT  03/18/2021   IR US GUIDE VASC ACCESS RIGHT  03/18/2021   LEFT HEART CATH AND CORONARY ANGIOGRAPHY N/A 04/04/2017   Procedure: Left Heart Cath and Coronary Angiography;  Surgeon: Lyn Records, MD;   Location: [facility] INVASIVE CV LAB;  Service: Cardiovascular;  Laterality: N/A;   MYOMECTOMY  1980, 2004, 2007   PERIPHERAL VASCULAR THROMBECTOMY  02/21/2022   Procedure: PERIPHERAL VASCULAR THROMBECTOMY;  Surgeon: Maeola Harman, MD;  Location: [facility] INVASIVE CV LAB;  Service: Cardiovascular;;  Right Tibial   RHINOPLASTY  1971   ROTATOR CUFF REPAIR  2003   SURGICAL REPAIR OF HEMORRHAGE  2015   TONSILLECTOMY  1968   Patient Active Problem List   Diagnosis Date Noted   Wheelchair dependence 06/09/2023   Hx of right BKA (HCC) 03/10/2023   CHF (congestive heart failure) (HCC) 02/10/2023   Dyspnea 02/09/2023   Acute respiratory distress 02/09/2023   Urinary tract infection without hematuria    Diabetes mellitus due to underlying condition with hypoglycemia without coma, with long-term current use of insulin (HCC)    Seizure disorder (HCC) 06/22/2022   Acute respiratory failure (HCC)    Unresponsiveness    Pressure ulcer 06/07/2022   Acute stroke due to ischemia (HCC) 06/03/2022   Chest pain 06/01/2022   History of CVA (cerebrovascular accident) 06/01/2022   Seizure (HCC)    Encephalopathy    Below-knee amputation of right lower extremity (HCC)    Leukocytosis    Foot infection 05/10/2022   PAD (peripheral artery disease) (HCC) 05/10/2022   Gangrene of right foot (HCC) 04/15/2022   Left hemiparesis (HCC) 09/24/2021   Abnormality of gait 09/24/2021   Pain of right hand 08/06/2021   Steal syndrome of dialysis vascular access (HCC) 08/06/2021   Subclavian steal syndrome of right subclavian artery 06/25/2021   Right hand weakness 06/25/2021   Stroke-like symptoms 05/06/2021   Bandemia    Cerebrovascular accident (CVA) of right basal ganglia (HCC) 04/20/2021   Hemorrhoids    Basal ganglia infarction (HCC) 04/16/2021   ESRD on dialysis (HCC) 04/16/2021   Hypertensive urgency 03/10/2021   Hilar enlargement    Anemia of chronic disease    Chronic diastolic CHF (congestive heart  failure) (HCC) 03/09/2021   CKD (chronic kidney disease), stage V (HCC) 03/08/2021   Diabetic retinopathy (HCC) 03/08/2021   Hyperglycemia due to type 2 diabetes mellitus (HCC) 03/08/2021   Pure hypercholesterolemia 03/08/2021   Anemia in chronic kidney disease 09/24/2020   Uncontrolled type 2 diabetes mellitus with hyperglycemia (HCC) 03/19/2019   Osteopenia 09/04/2018   Migraine 09/04/2018   Asthma 09/04/2018   Pseudophakia of both eyes 07/17/2018   Pseudophakia of left eye 07/03/2018   Open angle with borderline findings and high glaucoma risk in left eye 07/03/2018   Age-related nuclear cataract  of right eye 07/03/2018   CAD (coronary artery disease), native coronary artery 04/27/2017   Abnormal nuclear stress test 04/04/2017   Shortness of breath 03/09/2017   Appendicitis    Age-related hypermature cataract of both eyes 12/20/2016   Uterine leiomyoma 11/27/2012   Dyslipidemia (high LDL; low HDL) 11/25/2011   Obesity (BMI 30-39.9) 11/25/2011   Hypertensive disorder 11/21/2011   Type 2 diabetes mellitus (HCC) 11/21/2011   Abnormal EKG 11/21/2011    ONSET DATE: 12/29/2022 prosthesis delivery  REFERRING DIAG: W09.811B (ICD-10-CM) - Below-knee amputation of right lower extremity   THERAPY DIAG:  Unsteadiness on feet  Other abnormalities of gait and mobility  Muscle weakness (generalized)  Hemiplegia and hemiparesis following cerebral infarction affecting left non-dominant side (HCC)  Chronic pain of both knees  Rationale for Evaluation and Treatment: Rehabilitation  SUBJECTIVE:   SUBJECTIVE STATEMENT:   She denies pain upon arrival, her husband states he lost the wrist wrap that OT had given them, plans to order 2 more for home, one for each wrist. They are prepared to finish up with PT Thursday.  PERTINENT HISTORY: CVA / left hemiparesis due to right basal ganglia infarct 7/22, right hip bursitis, anxiety, dCHF, ESRD on HD, HTN, DM, CAD  PAIN:  Are you having pain?   Yes: NPRS scale:    reports no pain in knee or back sitting, but transition with standing with toilet 4/10 at times Pain location: Rt knee, back Pain description: throbbing Aggravating factors: standing, walking Relieving factors: Voltarin & massage  PRECAUTIONS: Fall and Other: NO BP RUE  WEIGHT BEARING RESTRICTIONS: No  FALLS: Has patient fallen in last 6 months? No  LIVING ENVIRONMENT: Lives with: lives with their spouse and CNAs 24 hrs 7 days / wk Lives in: House Home Access: Ramped entrance Home layout: Multi level, Full bath on main level, and Able to live on main level with bedroom and bathroom Stairs: Yes: Internal: 16 steps; on left going up and External: 4 steps; on right going up Has following equipment at home: Single point cane, Walker - 2 wheeled, Wheelchair (manual), and shower chair  PLOF: Independent with household mobility with device and Independent with community mobility with device  PATIENT GOALS: walk with prosthesis, stand & transfer.   OBJECTIVE:  COGNITION: Overall cognitive status: History of cognitive impairments - at baseline  POSTURE: rounded shoulders, forward head, decreased lumbar lordosis, increased thoracic kyphosis, and flexed trunk   LOWER EXTREMITY ROM: Eval / 01/05/2023:    BLEs PROM WFL except unable to assess hip ext  (Blank rows = not tested)  LOWER EXTREMITY MMT:  MMT Right eval Right 03/06/23  Hip flexion 3-/5 3-/5  Hip extension 3-/5 3-/5  Hip abduction 3-/5 3-/5  Hip adduction    Hip internal rotation    Hip external rotation    Knee flexion 3-/5 3-/5  Knee extension 3-/5 3-/5  Ankle dorsiflexion NA NA  Ankle plantarflexion NA NA  Ankle inversion NA NA  Ankle eversion NA NA  03/06/2023: LLE hemiparesis but able to advance during gait without assistance.   Eval / 01/05/2023: left LE & UE hemiparesis.  In gait slides to advance LLE and knee / hip instability in stance.  (Blank rows = not  tested)  TRANSFERS: 06/13/2023: Sit to stand w/c & 18" chair with armrests to RW with modA. Stand to sit with minA. Stand-pivot transfer with RW with minA once pt standing.   05/09/23 Sit to stand w/c & 18" chair with armrests to  RW with modA. Stand to sit with minA. Stand-pivot  transfer with RW with modA / constant cueing. Stand-pivot transfer holding onto PT mod A  03/06/2023:  Sit to stand w/c & 18" chair with armrests to RW with modA. Stand to sit with minA. Stand-pivot  transfer with RW with modA / constant cueing.  02/07/2023: Sit to stand w/c or 18" chair with armrests to RW with modA. Stand to sit with minA. Both require constant cues. Stand-pivot transfer with RW with modA / constant cueing. (2nd person for safety)   01/05/2023 / Eval: Sit to stand: Max A pulling BUEs on //bars from w/c Stand to sit: Min A using BUEs //bars to control descent Lateral scoot transfers: Mod A w/c to / from mat table level & adjacent   FUNCTIONAL TESTs:  06/13/2023: PT manually cues weight shift over feet with modA initially then pt able to stand 2 min with RW support with close supervision.   03/06/2023: static stance with RW support 60 sec with minA.  Eval / 01/05/2023:  Standing in //bars for 30 sec with modA.   GAIT: 06/13/2023: Pt amb 25' with RW with minA once pt standing.  Pt required w/c following so she can sit immediately upon fatiguing.   03/06/2023: Pt amb 5' with RW with +2 modA including 90* turn to chair.  02/07/2023: Pt amb 5' with RW with +2 modA including 90* turn to chair.   01/05/2023 / Eval: Gait pattern: step to pattern, decreased step length- Right, decreased step length- Left, decreased stance time- Right, decreased stance time- Left, decreased hip/knee flexion- Left, decreased ankle dorsiflexion- Left, Right hip hike, Left hip hike, knee flexed in stance- Left, antalgic, lateral hip instability, trunk flexed, wide BOS, and poor foot clearance- Left Distance walked:  3' Assistive device utilized: // bars & RLE TTA prosthesis Level of assistance: Total A / 2 person Comments:  LLE with poor clearance / slides foot forward and knee / hip instability in stance.   CURRENT PROSTHETIC WEAR ASSESSMENT: 06/13/2023: pt & husband report pt wearing prosthesis all awake hours except during dialysis and ~ 1 hour when her limb starts to throb.  Pt & husband report proper limb care, don/doff and cleaning prosthesis. They require intermittent PT instruction for adjusting ply socks and weight bearing.   03/06/2023: Patient's husband & CNA correctly don & doff her prosthesis. Pt is dependent requiring maxA to don or doff.  She reports wearing prosthesis 5 hrs 2x/day for 10 hours total on non-dialysis days and up to 8 hours on dialysis days.  Her limb has scab on lateral incision that appears to be internal suture working way out. No signs of infection. Her residual limb appearance is dry skin, normal temperature, limited to no hair growth, cylindrical shape.   Eval / 01/05/2023:  Patient is dependent with: skin check, residual limb care, care of non-amputated limb, prosthetic cleaning, ply sock cleaning, correct ply sock adjustment, proper wear schedule/adjustment, and proper weight-bearing schedule/adjustment Donning prosthesis: Total A / family & CNA requires 100% cueing Doffing prosthesis: Total A / family & CNA requires 100% cueing Prosthetic wear tolerance: 1-1.5 hours, 1x/day, 6 of 7 days since delivery Prosthetic weight bearing tolerance: 3 minutes during eval with c/o knee pain Edema: pitting Residual limb condition: lateral incision with 3mm X 6mm long scabs with serous drainage, arrived with bandaid over wound,  dry skin, normal temperature & color, cylindrical shape Prosthetic description: silicon liner with pin lock suspension, total contact socket, SACH  foot K code/activity level with prosthetic use: Level 1    TODAY'S TREATMENT:                                                                                                                              DATE: 07/20/2023: Prosthetic Training with TTA prosthesis: Sit to stand with RW and patient pushing off wheelchair with min assist X 6 reps total. Patient ambulated 30 feet with min A to CGA. Her prosthesis was observed to be externally rotating on her so had her sit and we removed prosthesis and added yellow ply sock that she had in her purse which improved this overall upon further ambulation. She was then able to ambulate 50 feet X 2 after that. Stand pivot car transfers wheelchair to car seat mod A X 2, Cues on fully turning feet to square up with chair prior to sitting if possible. PT performed one rep of this and then had her aide perform one rep of this with her and this appeared safe and effective so patient chose to ride home in car vs [place] today.   Therapeutic Exercise Leg press DL 16# 1W96, then SL 04# 5W09 each side Pulleys 2.5 min for shoulder ROM and gripping  07/20/2023: Prosthetic Training with TTA prosthesis: Sit to stand with RW and patient pushing off wheelchair with min to mod assist, requires cues for set up, hand placement, and sequence, X 5 reps total. Patient ambulated 30 feet X 2 with min A to CGA. No w/c following and trial of wrist wrap support versus wrist splint on walker.  Stand pivot transfer wheelchair to standard chair min A X 2, Cues on fully turning feet to square up with chair prior to sitting.   Therapeutic Exercise Nu step seat 10 L6 X 5 min UE/LE for functional endurance Leg press DL 81# 1B14, then SL 78# 2N56 each side Pulleys 2.5 min for shoulder ROM and gripping Prayer stretch to improve wrist extension ROM 10 sec X 5  07/18/2023: Prosthetic Training with TTA prosthesis: PT demo and verbal cues on left hand position for sit to from stand with hand splint on rolling walker.  Patient able to perform sit to/from stand with min assist and verbal cues for the the hand  placement. Patient ambulated 81' & 25' with RW with left hand splint with minA.  Patient reporting that the hand splint rubs the backside of her hand but PT did not note any contact with that area of the splint. PT instructed patient and husband and how to purchase the hand splint for her walker at home.  Her husband verbalized understanding.   HOME EXERCISE PROGRAM: Discussed walking program with aide at home, frequent short walks  ASSESSMENT:  CLINICAL IMPRESSION:  She has made good overall progress with PT from where she started. She can now ambulate safety at home with her caregivers as well as complete car transfer today with PT and with caregiver. She  has one more PT visit remaining so will plan to check remaining goals and discharge to independent program.   OBJECTIVE IMPAIRMENTS: Abnormal gait, decreased activity tolerance, decreased balance, decreased cognition, decreased coordination, decreased endurance, decreased knowledge of condition, decreased knowledge of use of DME, decreased mobility, difficulty walking, decreased ROM, decreased strength, postural dysfunction, prosthetic dependency , obesity, and pain.   ACTIVITY LIMITATIONS: standing, stairs, transfers, locomotion level, and safe prosthesis use.  PARTICIPATION LIMITATIONS: community activity, church, and household mobility  PERSONAL FACTORS: Age, Fitness, Past/current experiences, Time since onset of injury/illness/exacerbation, and 3+ comorbidities: see PMH  are also affecting patient's functional outcome.   REHAB POTENTIAL: Good  CLINICAL DECISION MAKING: Evolving/moderate complexity  EVALUATION COMPLEXITY: Moderate   GOALS: Goals reviewed with patient? Yes  SHORT TERM GOALS: Target date: 07/14/2023  Patient reports transferring out of w/c >/= 2x/day on non-dialysis days with CNA or family assistance.  Baseline: SEE OBJECTIVE DATA Goal status: Met 07/18/2023 2.  Patient reports knee pain <4/10.  Baseline: SEE  OBJECTIVE DATA Goal status:  MET 07/18/2023  3.  Patient sit to/from stand modA. Baseline: SEE OBJECTIVE DATA Goal status: MET 07/11/2023  4. Patient ambulates 53' between 2 chairs (without w/c following pt) with minA. Baseline: SEE OBJECTIVE DATA Goal status: MET 07/11/2023    LONG TERM GOALS: Target date: 09/07/2023  Patient & family / CNA demonstrates & verbalized understanding of prosthetic care to enable safe utilization of prosthesis. Baseline: SEE OBJECTIVE DATA Goal status: Ongoing 07/18/2023  Patient tolerates prosthesis wear >90% of awake hours without skin or limb pain issues and knee pain </= 4/10.  Baseline: SEE OBJECTIVE DATA Goal status:  Ongoing 07/18/2023  Stand balance with walker: static 2 min with supervision to enable ability for standing ADLS. Baseline: SEE OBJECTIVE DATA Goal status: Ongoing 07/18/2023  Patient ambulates >50' with RW & prosthesis only with family / CNA assist safely.  Baseline: SEE OBJECTIVE DATA Goal status: Ongoing 07/18/2023  Patient transfers stand pivot including sit to/from stand with RW with minA or less.  Baseline: SEE OBJECTIVE DATA Goal status:  Ongoing 07/25/2023, mod A overall   PLAN:  PT FREQUENCY: 1-2x/week  PT DURATION: 12 weeks / 90 days  PLANNED INTERVENTIONS: Therapeutic exercises, Therapeutic activity, Neuromuscular re-education, Balance training, Gait training, Patient/Family education, Self Care, Vestibular training, Prosthetic training, DME instructions, and Manual therapy  PLAN FOR NEXT SESSION:  Check remaining long term goals and DC to independent program  April Manson, PT, DPT 07/25/2023, 5:13 PM

## 2023-07-27 ENCOUNTER — Ambulatory Visit: Payer: Medicare PPO | Admitting: Physical Therapy

## 2023-07-27 ENCOUNTER — Encounter: Payer: Self-pay | Admitting: Physical Therapy

## 2023-07-27 DIAGNOSIS — M6281 Muscle weakness (generalized): Secondary | ICD-10-CM | POA: Diagnosis not present

## 2023-07-27 DIAGNOSIS — I69354 Hemiplegia and hemiparesis following cerebral infarction affecting left non-dominant side: Secondary | ICD-10-CM

## 2023-07-27 DIAGNOSIS — R2689 Other abnormalities of gait and mobility: Secondary | ICD-10-CM | POA: Diagnosis not present

## 2023-07-27 DIAGNOSIS — R2681 Unsteadiness on feet: Secondary | ICD-10-CM

## 2023-07-27 NOTE — Therapy (Signed)
OUTPATIENT PHYSICAL THERAPY TREATMENT & DISCHARGE SUMMARY  Patient Name: FRETA YOSHINAGA MRN: [id number] DOB:[birth year], 72 y.o., female Today's Date: 07/27/2023  PCP: No PCP on file REFERRING PROVIDER: Aldean Baker, MD  PHYSICAL THERAPY DISCHARGE SUMMARY  Visits from Start of Care: 37  Current functional level related to goals / functional outcomes: See below   Remaining deficits: See below   Education / Equipment: Patient and husband / CNAs were educated in prosthetic care and safe assistance with gait, transfers & standing activities which they appear to understand.    Patient agrees to discharge. Patient goals were met. Patient is being discharged due to meeting the stated rehab goals.    END OF SESSION:  PT End of Session - 07/27/23 0806     Visit Number 37    Number of Visits 46    Date for PT Re-Evaluation 09/07/23    Authorization Type [employer] PPO    Authorization Time Period $20 COPAY, Approved 12 PT VISITS  Authorization #[id number]  Tracking #[id number] 8/20-11/14    Authorization - Visit Number 12    Authorization - Number of Visits 12    Progress Note Due on Visit 36    PT Start Time 0804    PT Stop Time 0842    PT Time Calculation (min) 38 min    Equipment Utilized During Treatment Gait belt    Activity Tolerance Patient limited by fatigue;Patient tolerated treatment well    Behavior During Therapy WFL for tasks assessed/performed                     Past Medical History:  Diagnosis Date   Acute GI bleeding    Allergy    Anemia    Anterior chest wall pain    Appendicitis 1965   Asthma    Body mass index 37.0-37.9, adult    Breast pain    Cataract    both eyes   CHF (congestive heart failure) (HCC)    Chronic kidney disease    stage 5 - on dialysis   Cognitive change 04/20/2021   r/t cva 03/2821   Complication of anesthesia    Memory loss after general   Dehydration 2014   Deviated septum 1971   Diabetes mellitus     Dysphagia due to old stroke    easy to get strangled when eating   Dyspnea 2014   Extrinsic asthma    WITH ASTHMA ATTACK   Fibroid 1980   GERD (gastroesophageal reflux disease)    Heart murmur    History of migraine    History of seizure    with stroke   Hx gestational diabetes    Hyperlipidemia    Hypertension 2014   Inguinal hernia 1959   Malaise and fatigue 2014   Non-IgE mediated allergic asthma 2014   Obesity    Pelvic pain    Pregnancy, high-risk 1985   Stroke (HCC) 04/20/2021   (CVA) of right basal ganglia   Tonsillitis 1968   Uterine fibroid 1980   Visual field defect    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   Past Surgical History:  Procedure Laterality Date   ABDOMINAL AORTOGRAM W/LOWER EXTREMITY N/A 02/21/2022   Procedure: ABDOMINAL AORTOGRAM W/LOWER EXTREMITY;  Surgeon: Maeola Harman, MD;  Location: [facility] INVASIVE CV LAB;  Service: Cardiovascular;  Laterality: N/A;   AMPUTATION Right 05/18/2022   Procedure: RIGHT BELOW KNEE AMPUTATION;  Surgeon: Nadara Mustard, MD;  Location: [facility] OR;  Service:  Orthopedics;  Laterality: Right;   AMPUTATION TOE Right 04/15/2022   Procedure: AMPUTATION  LST TOERIGHT FOOT;  Surgeon: Edwin Cap, DPM;  Location: [facility] ORS;  Service: Podiatry;  Laterality: Right;   APPENDECTOMY  1959   BASCILIC VEIN TRANSPOSITION Right 04/30/2021   Procedure: RIGHT FIRST STAGE BASCILIC VEIN TRANSPOSITION;  Surgeon: Chuck Hint, MD;  Location: [facility] OR;  Service: Vascular;  Laterality: Right;   CESAREAN SECTION  1985   COLONOSCOPY     ESOPHAGOGASTRODUODENOSCOPY (EGD) WITH PROPOFOL N/A 04/18/2021   Procedure: ESOPHAGOGASTRODUODENOSCOPY (EGD) WITH PROPOFOL;  Surgeon: Iva Boop, MD;  Location: [facility] ENDOSCOPY;  Service: Endoscopy;  Laterality: N/A;   EYE SURGERY     bilateral cataract    FLEXIBLE SIGMOIDOSCOPY N/A 04/18/2021   Procedure: FLEXIBLE SIGMOIDOSCOPY;  Surgeon: Iva Boop, MD;  Location: [facility] ENDOSCOPY;  Service: Endoscopy;  Laterality:  N/A;   HERNIA REPAIR  1959   IR FLUORO GUIDE CV LINE RIGHT  03/18/2021   IR US GUIDE VASC ACCESS RIGHT  03/18/2021   LEFT HEART CATH AND CORONARY ANGIOGRAPHY N/A 04/04/2017   Procedure: Left Heart Cath and Coronary Angiography;  Surgeon: Lyn Records, MD;  Location: [facility] INVASIVE CV LAB;  Service: Cardiovascular;  Laterality: N/A;   MYOMECTOMY  1980, 2004, 2007   PERIPHERAL VASCULAR THROMBECTOMY  02/21/2022   Procedure: PERIPHERAL VASCULAR THROMBECTOMY;  Surgeon: Maeola Harman, MD;  Location: [facility] INVASIVE CV LAB;  Service: Cardiovascular;;  Right Tibial   RHINOPLASTY  1971   ROTATOR CUFF REPAIR  2003   SURGICAL REPAIR OF HEMORRHAGE  2015   TONSILLECTOMY  1968   Patient Active Problem List   Diagnosis Date Noted   Wheelchair dependence 06/09/2023   Hx of right BKA (HCC) 03/10/2023   CHF (congestive heart failure) (HCC) 02/10/2023   Dyspnea 02/09/2023   Acute respiratory distress 02/09/2023   Urinary tract infection without hematuria    Diabetes mellitus due to underlying condition with hypoglycemia without coma, with long-term current use of insulin (HCC)    Seizure disorder (HCC) 06/22/2022   Acute respiratory failure (HCC)    Unresponsiveness    Pressure ulcer 06/07/2022   Acute stroke due to ischemia (HCC) 06/03/2022   Chest pain 06/01/2022   History of CVA (cerebrovascular accident) 06/01/2022   Seizure (HCC)    Encephalopathy    Below-knee amputation of right lower extremity (HCC)    Leukocytosis    Foot infection 05/10/2022   PAD (peripheral artery disease) (HCC) 05/10/2022   Gangrene of right foot (HCC) 04/15/2022   Left hemiparesis (HCC) 09/24/2021   Abnormality of gait 09/24/2021   Pain of right hand 08/06/2021   Steal syndrome of dialysis vascular access (HCC) 08/06/2021   Subclavian steal syndrome of right subclavian artery 06/25/2021   Right hand weakness 06/25/2021   Stroke-like symptoms 05/06/2021   Bandemia    Cerebrovascular accident (CVA) of right  basal ganglia (HCC) 04/20/2021   Hemorrhoids    Basal ganglia infarction (HCC) 04/16/2021   ESRD on dialysis (HCC) 04/16/2021   Hypertensive urgency 03/10/2021   Hilar enlargement    Anemia of chronic disease    Chronic diastolic CHF (congestive heart failure) (HCC) 03/09/2021   CKD (chronic kidney disease), stage V (HCC) 03/08/2021   Diabetic retinopathy (HCC) 03/08/2021   Hyperglycemia due to type 2 diabetes mellitus (HCC) 03/08/2021   Pure hypercholesterolemia 03/08/2021   Anemia in chronic kidney disease 09/24/2020   Uncontrolled type 2 diabetes mellitus with hyperglycemia (HCC) 03/19/2019  Osteopenia 09/04/2018   Migraine 09/04/2018   Asthma 09/04/2018   Pseudophakia of both eyes 07/17/2018   Pseudophakia of left eye 07/03/2018   Open angle with borderline findings and high glaucoma risk in left eye 07/03/2018   Age-related nuclear cataract of right eye 07/03/2018   CAD (coronary artery disease), native coronary artery 04/27/2017   Abnormal nuclear stress test 04/04/2017   Shortness of breath 03/09/2017   Appendicitis    Age-related hypermature cataract of both eyes 12/20/2016   Uterine leiomyoma 11/27/2012   Dyslipidemia (high LDL; low HDL) 11/25/2011   Obesity (BMI 30-39.9) 11/25/2011   Hypertensive disorder 11/21/2011   Type 2 diabetes mellitus (HCC) 11/21/2011   Abnormal EKG 11/21/2011    ONSET DATE: 12/29/2022 prosthesis delivery  REFERRING DIAG: E95.284X (ICD-10-CM) - Below-knee amputation of right lower extremity   THERAPY DIAG:  Unsteadiness on feet  Other abnormalities of gait and mobility  Muscle weakness (generalized)  Hemiplegia and hemiparesis following cerebral infarction affecting left non-dominant side (HCC)  Rationale for Evaluation and Treatment: Rehabilitation  SUBJECTIVE:   SUBJECTIVE STATEMENT:  Dr. Harlon Flor went to [facility] and they plan to start going 1-2 x/wk at PT recommended.  She is wearing prosthesis most of awake hours.   PERTINENT  HISTORY: CVA / left hemiparesis due to right basal ganglia infarct 7/22, right hip bursitis, anxiety, dCHF, ESRD on HD, HTN, DM, CAD  PAIN:  Are you having pain?  Yes: NPRS scale:  reports no pain in knee or back sitting, but transition with standing with toilet 4/10 at times Pain location: Rt knee, back Pain description: throbbing Aggravating factors: standing, walking Relieving factors: Voltarin & massage  PRECAUTIONS: Fall and Other: NO BP RUE  WEIGHT BEARING RESTRICTIONS: No  FALLS: Has patient fallen in last 6 months? No  LIVING ENVIRONMENT: Lives with: lives with their spouse and CNAs 24 hrs 7 days / wk Lives in: House Home Access: Ramped entrance Home layout: Multi level, Full bath on main level, and Able to live on main level with bedroom and bathroom Stairs: Yes: Internal: 16 steps; on left going up and External: 4 steps; on right going up Has following equipment at home: Single point cane, Walker - 2 wheeled, Wheelchair (manual), and shower chair  PLOF: Independent with household mobility with device and Independent with community mobility with device  PATIENT GOALS: walk with prosthesis, stand & transfer.   OBJECTIVE:  COGNITION: Overall cognitive status: History of cognitive impairments - at baseline  POSTURE: rounded shoulders, forward head, decreased lumbar lordosis, increased thoracic kyphosis, and flexed trunk   LOWER EXTREMITY ROM: Eval / 01/05/2023:    BLEs PROM WFL except unable to assess hip ext  (Blank rows = not tested)  LOWER EXTREMITY MMT:  MMT Right eval Right 03/06/23  Hip flexion 3-/5 3-/5  Hip extension 3-/5 3-/5  Hip abduction 3-/5 3-/5  Hip adduction    Hip internal rotation    Hip external rotation    Knee flexion 3-/5 3-/5  Knee extension 3-/5 3-/5  Ankle dorsiflexion NA NA  Ankle plantarflexion NA NA  Ankle inversion NA NA  Ankle eversion NA NA  03/06/2023: LLE hemiparesis but able to advance during gait without assistance.   Eval  / 01/05/2023: left LE & UE hemiparesis.  In gait slides to advance LLE and knee / hip instability in stance.  (Blank rows = not tested)  TRANSFERS: 07/27/2023: Sit to stand w/c to RW with minA to modA and stand to sit with verbal cues.  06/13/2023: Sit to stand w/c & 18" chair with armrests to RW with modA. Stand to sit with minA. Stand-pivot transfer with RW with minA once pt standing.   05/09/23 Sit to stand w/c & 18" chair with armrests to RW with modA. Stand to sit with minA. Stand-pivot  transfer with RW with modA / constant cueing. Stand-pivot transfer holding onto PT mod A  03/06/2023:  Sit to stand w/c & 18" chair with armrests to RW with modA. Stand to sit with minA. Stand-pivot  transfer with RW with modA / constant cueing.  02/07/2023: Sit to stand w/c or 18" chair with armrests to RW with modA. Stand to sit with minA. Both require constant cues. Stand-pivot transfer with RW with modA / constant cueing. (2nd person for safety)   01/05/2023 / Eval: Sit to stand: Max A pulling BUEs on //bars from w/c Stand to sit: Min A using BUEs //bars to control descent Lateral scoot transfers: Mod A w/c to / from mat table level & adjacent   FUNCTIONAL TESTs:  07/27/2023: Pt able to stand with RW support for 2 min with supervision.   06/13/2023: PT manually cues weight shift over feet with modA initially then pt able to stand 2 min with RW support with close supervision.   03/06/2023: static stance with RW support 60 sec with minA.  Eval / 01/05/2023:  Standing in //bars for 30 sec with modA.   GAIT: 07/27/2023: pt amb 50' including turning around obstacle with RW with minA to CGA.  Husband and CNA report walking at home with no concerns or issues.   06/13/2023: Pt amb 25' with RW with minA once pt standing.  Pt required w/c following so she can sit immediately upon fatiguing.   03/06/2023: Pt amb 5' with RW with +2 modA including 90* turn to chair.  02/07/2023: Pt amb 5' with RW  with +2 modA including 90* turn to chair.   01/05/2023 / Eval: Gait pattern: step to pattern, decreased step length- Right, decreased step length- Left, decreased stance time- Right, decreased stance time- Left, decreased hip/knee flexion- Left, decreased ankle dorsiflexion- Left, Right hip hike, Left hip hike, knee flexed in stance- Left, antalgic, lateral hip instability, trunk flexed, wide BOS, and poor foot clearance- Left Distance walked: 3' Assistive device utilized: // bars & RLE TTA prosthesis Level of assistance: Total A / 2 person Comments:  LLE with poor clearance / slides foot forward and knee / hip instability in stance.   CURRENT PROSTHETIC WEAR ASSESSMENT: 07/27/2023: pt reports wearing prosthesis most of awake hours. During dialysis she removes prosthesis only leaving liner on limb to be more comfortable.  Pt & husband verbalized proper prosthetic care including dialysis weight adjustment for prosthesis.   06/13/2023: pt & husband report pt wearing prosthesis all awake hours except during dialysis and ~ 1 hour when her limb starts to throb.  Pt & husband report proper limb care, don/doff and cleaning prosthesis. They require intermittent PT instruction for adjusting ply socks and weight bearing.   03/06/2023: Patient's husband & CNA correctly don & doff her prosthesis. Pt is dependent requiring maxA to don or doff.  She reports wearing prosthesis 5 hrs 2x/day for 10 hours total on non-dialysis days and up to 8 hours on dialysis days.  Her limb has scab on lateral incision that appears to be internal suture working way out. No signs of infection. Her residual limb appearance is dry skin, normal temperature, limited to no hair growth, cylindrical  shape.   Eval / 01/05/2023:  Patient is dependent with: skin check, residual limb care, care of non-amputated limb, prosthetic cleaning, ply sock cleaning, correct ply sock adjustment, proper wear schedule/adjustment, and proper weight-bearing  schedule/adjustment Donning prosthesis: Total A / family & CNA requires 100% cueing Doffing prosthesis: Total A / family & CNA requires 100% cueing Prosthetic wear tolerance: 1-1.5 hours, 1x/day, 6 of 7 days since delivery Prosthetic weight bearing tolerance: 3 minutes during eval with c/o knee pain Edema: pitting Residual limb condition: lateral incision with 3mm X 6mm long scabs with serous drainage, arrived with bandaid over wound,  dry skin, normal temperature & color, cylindrical shape Prosthetic description: silicon liner with pin lock suspension, total contact socket, SACH foot K code/activity level with prosthetic use: Level 1    TODAY'S TREATMENT:                                                                                                                             DATE: 07/27/2023: Prosthetic Training with TTA prosthesis: See objective data above  Therapeutic Exercise: PT verbally reviewed need for ongoing exercises including [employer] and activities to maintain gains made during PT. PT verbally reviewed leg press recommendations for 2 sets of 10 reps both legs & single leg and Nustep seat setting 2 sets 10 min with timed 5 min rest (reduce over time until 20 min straight).  Pt & husband verbalized understanding.    07/25/2023: Prosthetic Training with TTA prosthesis: Sit to stand with RW and patient pushing off wheelchair with min assist X 6 reps total. Patient ambulated 30 feet with min A to CGA. Her prosthesis was observed to be externally rotating on her so had her sit and we removed prosthesis and added yellow ply sock that she had in her purse which improved this overall upon further ambulation. She was then able to ambulate 50 feet X 2 after that. Stand pivot car transfers wheelchair to car seat mod A X 2, Cues on fully turning feet to square up with chair prior to sitting if possible. PT performed one rep of this and then had her aide perform one rep of this with her and this  appeared safe and effective so patient chose to ride home in car vs [place] today.   Therapeutic Exercise Leg press DL 86# 5H84, then SL 69# 6E95 each side Pulleys 2.5 min for shoulder ROM and gripping  07/20/2023: Prosthetic Training with TTA prosthesis: Sit to stand with RW and patient pushing off wheelchair with min to mod assist, requires cues for set up, hand placement, and sequence, X 5 reps total. Patient ambulated 30 feet X 2 with min A to CGA. No w/c following and trial of wrist wrap support versus wrist splint on walker.  Stand pivot transfer wheelchair to standard chair min A X 2, Cues on fully turning feet to square up with chair prior to sitting.   Therapeutic Exercise Nu step seat 10 L6  X 5 min UE/LE for functional endurance Leg press DL 04# 5W09, then SL 81# 1B14 each side Pulleys 2.5 min for shoulder ROM and gripping Prayer stretch to improve wrist extension ROM 10 sec X 5   HOME EXERCISE PROGRAM: Discussed walking program with aide at home, frequent short walks  ASSESSMENT:  CLINICAL IMPRESSION:  patient met all LTGs set.  She appears to be wearing & using prosthesis most of awake hours now.  Her progress has been slow due to multiple medical diagnosis. She has improved her functional ability including walking with family / CNA assistance, standing balance to enable easier & more thorough personal hygiene care and transfers including to car.    OBJECTIVE IMPAIRMENTS: Abnormal gait, decreased activity tolerance, decreased balance, decreased cognition, decreased coordination, decreased endurance, decreased knowledge of condition, decreased knowledge of use of DME, decreased mobility, difficulty walking, decreased ROM, decreased strength, postural dysfunction, prosthetic dependency , obesity, and pain.   ACTIVITY LIMITATIONS: standing, stairs, transfers, locomotion level, and safe prosthesis use.  PARTICIPATION LIMITATIONS: community activity, church, and household  mobility  PERSONAL FACTORS: Age, Fitness, Past/current experiences, Time since onset of injury/illness/exacerbation, and 3+ comorbidities: see PMH  are also affecting patient's functional outcome.   REHAB POTENTIAL: Good  CLINICAL DECISION MAKING: Evolving/moderate complexity  EVALUATION COMPLEXITY: Moderate   GOALS: Goals reviewed with patient? Yes  SHORT TERM GOALS: Target date: 07/14/2023  Patient reports transferring out of w/c >/= 2x/day on non-dialysis days with CNA or family assistance.  Baseline: SEE OBJECTIVE DATA Goal status: Met 07/18/2023 2.  Patient reports knee pain <4/10.  Baseline: SEE OBJECTIVE DATA Goal status:  MET 07/18/2023  3.  Patient sit to/from stand modA. Baseline: SEE OBJECTIVE DATA Goal status: MET 07/11/2023  4. Patient ambulates 21' between 2 chairs (without w/c following pt) with minA. Baseline: SEE OBJECTIVE DATA Goal status: MET 07/11/2023    LONG TERM GOALS: Target date: 09/07/2023  Patient & family / CNA demonstrates & verbalized understanding of prosthetic care to enable safe utilization of prosthesis. Baseline: SEE OBJECTIVE DATA Goal status: MET 07/27/2023  Patient tolerates prosthesis wear >90% of awake hours without skin or limb pain issues and knee pain </= 4/10.  Baseline: SEE OBJECTIVE DATA Goal status:  MET 07/27/2023  Stand balance with walker: static 2 min with supervision to enable ability for standing ADLS. Baseline: SEE OBJECTIVE DATA Goal status: MET 07/27/2023  Patient ambulates >50' with RW & prosthesis with family / CNA assist safely.  Baseline: SEE OBJECTIVE DATA Goal status: MET 07/27/2023  Patient transfers stand pivot including sit to/from stand with RW with minA or less.  Baseline: SEE OBJECTIVE DATA Goal status:  Partially MET 07/27/2023   PLAN:  PT FREQUENCY: 1-2x/week  PT DURATION: 12 weeks / 90 days  PLANNED INTERVENTIONS: Therapeutic exercises, Therapeutic activity, Neuromuscular re-education, Balance  training, Gait training, Patient/Family education, Self Care, Vestibular training, Prosthetic training, DME instructions, and Manual therapy  PLAN FOR NEXT SESSION:  DC to independent program  Vladimir Faster, PT, DPT 07/27/2023, 8:56 AM

## 2023-08-07 ENCOUNTER — Other Ambulatory Visit: Payer: Self-pay | Admitting: Physical Medicine and Rehabilitation

## 2023-08-07 MED ORDER — TRAZODONE HCL 100 MG PO TABS: 100.0000 mg | ORAL_TABLET | Freq: Every evening | ORAL | 5 refills | Status: DC | PRN

## 2023-08-14 NOTE — Telephone Encounter (Signed)
error 

## 2023-08-18 ENCOUNTER — Other Ambulatory Visit: Payer: Self-pay | Admitting: Obstetrics and Gynecology

## 2023-08-18 DIAGNOSIS — Z1231 Encounter for screening mammogram for malignant neoplasm of breast: Secondary | ICD-10-CM

## 2023-08-24 ENCOUNTER — Encounter (HOSPITAL_BASED_OUTPATIENT_CLINIC_OR_DEPARTMENT_OTHER): Payer: Self-pay | Admitting: Cardiovascular Disease

## 2023-08-24 ENCOUNTER — Ambulatory Visit (HOSPITAL_BASED_OUTPATIENT_CLINIC_OR_DEPARTMENT_OTHER): Payer: Medicare PPO | Admitting: Cardiovascular Disease

## 2023-08-24 ENCOUNTER — Other Ambulatory Visit (HOSPITAL_BASED_OUTPATIENT_CLINIC_OR_DEPARTMENT_OTHER): Payer: Medicare PPO

## 2023-08-24 VITALS — BP 152/62 | HR 66 | Ht 63.0 in | Wt 183.0 lb

## 2023-08-24 DIAGNOSIS — Z5181 Encounter for therapeutic drug level monitoring: Secondary | ICD-10-CM | POA: Diagnosis not present

## 2023-08-24 DIAGNOSIS — R002 Palpitations: Secondary | ICD-10-CM

## 2023-08-24 DIAGNOSIS — I10 Essential (primary) hypertension: Secondary | ICD-10-CM

## 2023-08-24 DIAGNOSIS — I5032 Chronic diastolic (congestive) heart failure: Secondary | ICD-10-CM

## 2023-08-24 DIAGNOSIS — E785 Hyperlipidemia, unspecified: Secondary | ICD-10-CM

## 2023-08-24 DIAGNOSIS — I6381 Other cerebral infarction due to occlusion or stenosis of small artery: Secondary | ICD-10-CM

## 2023-08-24 DIAGNOSIS — I2510 Atherosclerotic heart disease of native coronary artery without angina pectoris: Secondary | ICD-10-CM

## 2023-08-24 NOTE — Patient Instructions (Signed)
Medication Instructions:  Your physician recommends that you continue on your current medications as directed. Please refer to the Current Medication list given to you today.   *If you need a refill on your cardiac medications before your next appointment, please call your pharmacy*  Lab Work: FASTING LP/CMET SOON   If you have labs (blood work) drawn today and your tests are completely normal, you will receive your results only by: MyChart Message (if you have MyChart) OR A paper copy in the mail If you have any lab test that is abnormal or we need to change your treatment, we will call you to review the results.  Testing/Procedures: 14 DAY ZIO   Follow-Up: At [facility], you and your health needs are our priority.  As part of our continuing mission to provide you with exceptional heart care, we have created designated Provider Care Teams.  These Care Teams include your primary Cardiologist (physician) and Advanced Practice Providers (APPs -  Physician Assistants and Nurse Practitioners) who all work together to provide you with the care you need, when you need it.  We recommend signing up for the patient portal called "MyChart".  Sign up information is provided on this After Visit Summary.  MyChart is used to connect with patients for Virtual Visits (Telemedicine).  Patients are able to view lab/test results, encounter notes, upcoming appointments, etc.  Non-urgent messages can be sent to your provider as well.   To learn more about what you can do with MyChart, go to [email or web address].    Your next appointment:   6 month(s)  Provider:   Chilton Si, MD or Gillian Shields, NP    Other Instructions  Carolyn Russo- Long Term Monitor Instructions  Your physician has requested you wear a ZIO patch monitor for 14 days.  This is a single patch monitor. [employer] supplies one patch monitor per enrollment. Additional stickers are not available. Please do not apply patch if you  will be having a Nuclear Stress Test,  Echocardiogram, Cardiac CT, MRI, or Chest Xray during the period you would be wearing the  monitor. The patch cannot be worn during these tests. You cannot remove and re-apply the  ZIO XT patch monitor.  Your ZIO patch monitor will be mailed 3 day USPS to your address on file. It may take 3-5 days  to receive your monitor after you have been enrolled.  Once you have received your monitor, please review the enclosed instructions. Your monitor  has already been registered assigning a specific monitor serial # to you.  Billing and Patient Assistance Program Information  We have supplied [employer] with any of your insurance information on file for billing purposes. [employer] offers a sliding scale Patient Assistance Program for patients that do not have  insurance, or whose insurance does not completely cover the cost of the ZIO monitor.  You must apply for the Patient Assistance Program to qualify for this discounted rate.  To apply, please call [employer] at [phone], select option 4, select option 2, ask to apply for  Patient Assistance Program. Carolyn Russo will ask your household income, and how many people  are in your household. They will quote your out-of-pocket cost based on that information.  [employer] will also be able to set up a 14-month, interest-free payment plan if needed.  Applying the monitor   Shave hair from upper left chest.  Hold abrader disc by orange tab. Rub abrader in 40 strokes over the upper left chest as  indicated  in your monitor instructions.  Clean area with 4 enclosed alcohol pads. Let dry.  Apply patch as indicated in monitor instructions. Patch will be placed under collarbone on left  side of chest with arrow pointing upward.  Rub patch adhesive wings for 2 minutes. Remove white label marked "1". Remove the white  label marked "2". Rub patch adhesive wings for 2 additional minutes.  While looking in a mirror, press and release  button in center of patch. A small green light will  flash 3-4 times. This will be your only indicator that the monitor has been turned on.  Do not shower for the first 24 hours. You may shower after the first 24 hours.  Press the button if you feel a symptom. You will hear a small click. Record Date, Time and  Symptom in the Patient Logbook.  When you are ready to remove the patch, follow instructions on the last 2 pages of Patient  Logbook. Stick patch monitor onto the last page of Patient Logbook.  Place Patient Logbook in the blue and white box. Use locking tab on box and tape box closed  securely. The blue and white box has prepaid postage on it. Please place it in the mailbox as  soon as possible. Your physician should have your test results approximately 7 days after the  monitor has been mailed back to [facility].  Call [facility] Customer Care at [phone] if you have questions regarding  your ZIO XT patch monitor. Call them immediately if you see an orange light blinking on your  monitor.  If your monitor falls off in less than 4 days, contact our Monitor department at [phone].  If your monitor becomes loose or falls off after 4 days call [employer] at [phone] for  suggestions on securing your monitor

## 2023-08-28 ENCOUNTER — Encounter (HOSPITAL_BASED_OUTPATIENT_CLINIC_OR_DEPARTMENT_OTHER): Payer: Self-pay | Admitting: Cardiovascular Disease

## 2023-09-01 ENCOUNTER — Encounter (INDEPENDENT_AMBULATORY_CARE_PROVIDER_SITE_OTHER): Payer: Medicare PPO | Admitting: Ophthalmology

## 2023-09-01 DIAGNOSIS — Z794 Long term (current) use of insulin: Secondary | ICD-10-CM

## 2023-09-01 DIAGNOSIS — H35033 Hypertensive retinopathy, bilateral: Secondary | ICD-10-CM | POA: Diagnosis not present

## 2023-09-01 DIAGNOSIS — E113513 Type 2 diabetes mellitus with proliferative diabetic retinopathy with macular edema, bilateral: Secondary | ICD-10-CM

## 2023-09-01 DIAGNOSIS — H43813 Vitreous degeneration, bilateral: Secondary | ICD-10-CM

## 2023-09-01 DIAGNOSIS — I10 Essential (primary) hypertension: Secondary | ICD-10-CM

## 2023-09-11 ENCOUNTER — Encounter: Payer: Medicare PPO | Attending: Physical Medicine and Rehabilitation | Admitting: Physical Medicine and Rehabilitation

## 2023-09-11 ENCOUNTER — Encounter: Payer: Self-pay | Admitting: Physical Medicine and Rehabilitation

## 2023-09-11 VITALS — BP 171/73 | HR 67 | Ht 63.0 in

## 2023-09-11 DIAGNOSIS — Z993 Dependence on wheelchair: Secondary | ICD-10-CM

## 2023-09-11 DIAGNOSIS — Z8673 Personal history of transient ischemic attack (TIA), and cerebral infarction without residual deficits: Secondary | ICD-10-CM

## 2023-09-11 DIAGNOSIS — S88111D Complete traumatic amputation at level between knee and ankle, right lower leg, subsequent encounter: Secondary | ICD-10-CM

## 2023-09-11 DIAGNOSIS — F5101 Primary insomnia: Secondary | ICD-10-CM

## 2023-09-11 DIAGNOSIS — G8194 Hemiplegia, unspecified affecting left nondominant side: Secondary | ICD-10-CM

## 2023-09-11 MED ORDER — GABAPENTIN 300 MG PO CAPS: 300.0000 mg | ORAL_CAPSULE | Freq: Every day | ORAL | 1 refills | Status: DC

## 2023-09-11 MED ORDER — METHYLPHENIDATE HCL 5 MG PO TABS: 5.0000 mg | ORAL_TABLET | Freq: Two times a day (BID) | ORAL | 0 refills | Status: DC

## 2023-09-11 MED ORDER — AMITRIPTYLINE HCL 25 MG PO TABS: 12.5000 mg | ORAL_TABLET | Freq: Every day | ORAL | 5 refills | Status: DC

## 2023-09-11 NOTE — Patient Instructions (Signed)
Pt is a 72 yr old female with recent hx of L hemiparesis due to R basal ganglia infarct in 7/22; R hip bursitis; anxiety; LGIB; dCHF; on HD from ESRD; HTN, BMI of 34 and DM- A1c 5.2- here for f/u on stroke and R hand weakness/numbness.  Also sedation with stroke.  She is here for f/u on Stroke and R BKA.     Don't like Benadryl for Sharis- says has nightmares with it-  so don't want her to have it, if can help it.   2.  Con't ritalin 5 mg 2x/day  #60- for CVA attention.    3. Con't gabapentin 300 mg nightly- for nerve pain- - will send in refills.    4.  Change trazodone to Amitriptyline- instead of trazodone- 12.5 to 25 mg nightly- for sleep- take 1 hour before sleeping time. Can also help nerve pain as well.    5. F/U in  3 months- on R BG stroke and R BKA

## 2023-09-11 NOTE — Progress Notes (Signed)
Subjective:    Patient ID: Carolyn Russo, female    DOB: 30-Apr-1951, 72 y.o.   MRN: [id number]  HPI  Pt is a 72 yr old female with recent hx of L hemiparesis due to R basal ganglia infarct in 7/22; R hip bursitis; anxiety; LGIB; dCHF; on HD from ESRD; HTN, BMI of 34 and DM- A1c 5.2- here for f/u on stroke and R hand weakness/numbness.  Also sedation with stroke.  She is here for f/u on Stroke and R BKA.     In fine spirits.  Up to trazodone up to 100 mg at bedtime- but usually not sleeping much per pt- didn't want to get up to go to HD.   Giving her Ritalin  1x/day.   PT ended, but not walking much at home- bedroom to bathroom/kitchen-  Trying to get husband to [facility].  Pulling u in bed on trapeze.  Right now want sot go to bed.  Has HD at 6-7am-    Pain Inventory Average Pain 0 Pain Right Now 0 My pain is intermittent, sharp, burning, dull, stabbing, tingling, and aching  LOCATION OF PAIN  right leg, back, both wrist, shoulders  BOWEL Number of stools per week: 4-7 Oral laxative use No   History of colostomy No    BLADDER Pads  Bladder incontinence Yes  Frequent urination Yes    Mobility use a walker ability to climb steps?  yes do you drive?  no use a wheelchair needs help with transfers  Function retired I need assistance with the following:  dressing, bathing, toileting, household duties, and shopping  Neuro/Psych bowel control problems weakness trouble walking spasms dizziness  Prior Studies Any changes since last visit?  no  Physicians involved in your care Any changes since last visit?  yes Chilton Si, Dr. Dahlia Bailiff   Family History  Problem Relation Age of Onset   Cancer Mother        abdominal melamona   Psoriasis Mother    Alzheimer's disease Father    Diabetes Maternal Aunt    Cancer Cousin        colon    Diabetes Cousin    Colon cancer Neg Hx    Colon polyps Neg Hx    Esophageal cancer Neg Hx    Cancer - Colon  Neg Hx    Liver disease Neg Hx    Social History   Socioeconomic History   Marital status: Married    Spouse name: Channing Mutters   Number of children: 1   Years of education: Not on file   Highest education level: Professional school degree (e.g., MD, DDS, DVM, JD)  Occupational History   Occupation: retired  Tobacco Use   Smoking status: Never   Smokeless tobacco: Never  Vaping Use   Vaping status: Never Used  Substance and Sexual Activity   Alcohol use: No   Drug use: No   Sexual activity: Not Currently    Birth control/protection: Post-menopausal  Other Topics Concern   Not on file  Social History Narrative   06/21/21 lives with husband Dr Chalmers Guest, caregiver- Joyce Gross   Patient reports she used to walk at [employer] but now due to Covid-19 she is staying inside.   Social Determinants of Health   Financial Resource Strain: Low Risk  (09/02/2021)   Overall Financial Resource Strain (CARDIA)    Difficulty of Paying Living Expenses: Not hard at all  Food Insecurity: No Food Insecurity (02/10/2023)   Hunger Vital Sign  Worried About Programme researcher, broadcasting/film/video in the Last Year: Never true    Ran Out of Food in the Last Year: Never true  Transportation Needs: No Transportation Needs (02/09/2023)   PRAPARE - Administrator, Civil Service (Medical): No    Lack of Transportation (Non-Medical): No  Physical Activity: Inactive (09/02/2021)   Exercise Vital Sign    Days of Exercise per Week: 0 days    Minutes of Exercise per Session: 0 min  Stress: Stress Concern Present (09/02/2021)   [employer] of Occupational Health - Occupational Stress Questionnaire    Feeling of Stress : To some extent  Social Connections: Socially Integrated (01/09/2019)   Social Connection and Isolation Panel [NHANES]    Frequency of Communication with Friends and Family: More than three times a week    Frequency of Social Gatherings with Friends and Family: More than three times a week    Attends  Religious Services: More than 4 times per year    Active Member of Clubs or Organizations: Yes    Attends Banker Meetings: More than 4 times per year    Marital Status: Married   Past Surgical History:  Procedure Laterality Date   ABDOMINAL AORTOGRAM W/LOWER EXTREMITY N/A 02/21/2022   Procedure: ABDOMINAL AORTOGRAM W/LOWER EXTREMITY;  Surgeon: Maeola Harman, MD;  Location: [facility] INVASIVE CV LAB;  Service: Cardiovascular;  Laterality: N/A;   AMPUTATION Right 05/18/2022   Procedure: RIGHT BELOW KNEE AMPUTATION;  Surgeon: Nadara Mustard, MD;  Location: [facility] OR;  Service: Orthopedics;  Laterality: Right;   AMPUTATION TOE Right 04/15/2022   Procedure: AMPUTATION  LST TOERIGHT FOOT;  Surgeon: Edwin Cap, DPM;  Location: [facility] ORS;  Service: Podiatry;  Laterality: Right;   APPENDECTOMY  1959   BASCILIC VEIN TRANSPOSITION Right 04/30/2021   Procedure: RIGHT FIRST STAGE BASCILIC VEIN TRANSPOSITION;  Surgeon: Chuck Hint, MD;  Location: [facility] OR;  Service: Vascular;  Laterality: Right;   CESAREAN SECTION  1985   COLONOSCOPY     ESOPHAGOGASTRODUODENOSCOPY (EGD) WITH PROPOFOL N/A 04/18/2021   Procedure: ESOPHAGOGASTRODUODENOSCOPY (EGD) WITH PROPOFOL;  Surgeon: Iva Boop, MD;  Location: [facility] ENDOSCOPY;  Service: Endoscopy;  Laterality: N/A;   EYE SURGERY     bilateral cataract    FLEXIBLE SIGMOIDOSCOPY N/A 04/18/2021   Procedure: FLEXIBLE SIGMOIDOSCOPY;  Surgeon: Iva Boop, MD;  Location: [facility] ENDOSCOPY;  Service: Endoscopy;  Laterality: N/A;   HERNIA REPAIR  1959   IR FLUORO GUIDE CV LINE RIGHT  03/18/2021   IR US GUIDE VASC ACCESS RIGHT  03/18/2021   LEFT HEART CATH AND CORONARY ANGIOGRAPHY N/A 04/04/2017   Procedure: Left Heart Cath and Coronary Angiography;  Surgeon: Lyn Records, MD;  Location[facility] INVASIVE CV LAB;  Service: Cardiovascular;  Laterality: N/A;   MYOMECTOMY  1980, 2004, 2007   PERIPHERAL VASCULAR THROMBECTOMY  02/21/2022   Procedure: PERIPHERAL  VASCULAR THROMBECTOMY;  Surgeon: Maeola Harman, MD;  Location: [facility] INVASIVE CV LAB;  Service: Cardiovascular;;  Right Tibial   RHINOPLASTY  1971   ROTATOR CUFF REPAIR  2003   SURGICAL REPAIR OF HEMORRHAGE  2015   TONSILLECTOMY  1968   Past Medical History:  Diagnosis Date   Acute GI bleeding    Allergy    Anemia    Anterior chest wall pain    Appendicitis 1965   Asthma    Body mass index 37.0-37.9, adult    Breast pain    Cataract  both eyes   CHF (congestive heart failure) (HCC)    Chronic kidney disease    stage 5 - on dialysis   Cognitive change 04/20/2021   r/t cva 03/2821   Complication of anesthesia    Memory loss after general   Dehydration 2014   Deviated septum 1971   Diabetes mellitus    Dysphagia due to old stroke    easy to get strangled when eating   Dyspnea 2014   Extrinsic asthma    WITH ASTHMA ATTACK   Fibroid 1980   GERD (gastroesophageal reflux disease)    Heart murmur    History of migraine    History of seizure    with stroke   Hx gestational diabetes    Hyperlipidemia    Hypertension 2014   Inguinal hernia 1959   Malaise and fatigue 2014   Non-IgE mediated allergic asthma 2014   Obesity    Pelvic pain    Pregnancy, high-risk 1985   Stroke (HCC) 04/20/2021   (CVA) of right basal ganglia   Tonsillitis 1968   Uterine fibroid 1980   Visual field defect    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   There were no vitals taken for this visit.  Opioid Risk Score:   Fall Risk Score:  `1  Depression screen [facility] 2/9     12/09/2022    2:48 PM 09/24/2021    1:58 PM 09/02/2021   12:26 PM 06/25/2021   11:24 AM 11/26/2020    3:13 PM 08/12/2020    3:46 PM 06/18/2019    2:03 PM  Depression screen PHQ 2/9  Decreased Interest 0 1 0 1 0 0 0  Down, Depressed, Hopeless 0 1 0 1 0 0 0  PHQ - 2 Score 0 2 0 2 0 0 0  Altered sleeping    0     Tired, decreased energy    3     Change in appetite    0     Feeling bad or failure about yourself     0     Trouble  concentrating    0     Moving slowly or fidgety/restless    0     Suicidal thoughts    0     PHQ-9 Score    5     Difficult doing work/chores    Somewhat difficult       Review of Systems  Gastrointestinal:  Positive for constipation.  Genitourinary:        Incontinence   Musculoskeletal:  Positive for back pain and gait problem.       Pain in both shoulders, both wrist  Neurological:  Positive for dizziness and weakness.  All other systems reviewed and are negative.     Objective:   Physical Exam  Awake, alert, appropriate, flat, but interactive- flat speech;  Wearing R BKA prosthesis In manual w/c-  L hand swelling 1+ compared to R hand- back to normal now       Assessment & Plan:   Pt is a 72 yr old female with recent hx of L hemiparesis due to R basal ganglia infarct in 7/22; R hip bursitis; anxiety; LGIB; dCHF; on HD from ESRD; HTN, BMI of 34 and DM- A1c 5.2- here for f/u on stroke and R hand weakness/numbness.  Also sedation with stroke.  She is here for f/u on Stroke and R BKA.     Don't like Benadryl for Tsering- says has nightmares with it-  so don't want her to have it, if can help it.   2.  Con't ritalin 5 mg 2x/day  #60- for CVA attention.    3. Con't gabapentin 300 mg nightly- for nerve pain- - will send in refills.    4.  Change trazodone to Amitriptyline- instead of trazodone- 12.5 to 25 mg nightly- for sleep- take 1 hour before sleeping time. Can also help nerve pain as well.    5. F/U in  3 months- on R BG stroke and R BKA  6.

## 2023-09-15 ENCOUNTER — Telehealth: Payer: Self-pay | Admitting: Cardiovascular Disease

## 2023-09-15 DIAGNOSIS — I455 Other specified heart block: Secondary | ICD-10-CM

## 2023-09-15 DIAGNOSIS — I471 Supraventricular tachycardia, unspecified: Secondary | ICD-10-CM

## 2023-09-15 NOTE — Telephone Encounter (Signed)
Please advise. Abnormal pauses in heart monitor. Thank you!!

## 2023-09-15 NOTE — Telephone Encounter (Signed)
[employer] with abn monitor results

## 2023-09-15 NOTE — Telephone Encounter (Signed)
Seen by Dr. Duke Salvia 08/24/23 with episode of palpitations, ZIO ordered.   Monitor report available in media. Notable for:  Sat 09/02/23 9:02:55am 5.8 second pause Sat 09/02/23 9:02:52am 3.6 seconds  Sat 09/02/23 9:03:01am 13.3 seconds of 3rd degree AV block 31 bpm. Not triggered event.  On review there are p-waves during the EKG strip more consistent with CHB.  Called Miss Marsan to inquire if she was having symptoms at time of event. No answer and left VM per DPR requesting she call us back or if after 5pm speak to on call provider. Will make on call provider aware. Will also send MyChart message.   Of note, she does have ESRD and is on HD. Would wonder whether she was (a) asleep at time of event or (b) at HD and electrolytes abnormalities could have contributed. Unable to check what her HD days are from chart.  Discussed with Dr. Cristal Deer, as event occurred 2 weeks ago. If no symptoms/syncope, stop Carvedilol and refer to EP.  If syncope, stop Carvedilol and present to ED for evaluation  Alver Sorrow, NP

## 2023-09-15 NOTE — Telephone Encounter (Signed)
Monitor company called to report event that occurred on 11/9 at 9:02 a.m. 2 episodes:      9.3 sec pause      15.8 sec pause, HR 31  Forwarding for review/FYI

## 2023-09-18 ENCOUNTER — Telehealth: Payer: Self-pay | Admitting: Family

## 2023-09-18 NOTE — Telephone Encounter (Signed)
Pt's husband returning nurses call. Cal transferred

## 2023-09-18 NOTE — Telephone Encounter (Signed)
Spoke with husband, patient was feeling fine on 11/9 Discussed with Ronn Melena NP who wanted to know what days were dialysis days Left message to call back and sent mychart message

## 2023-09-18 NOTE — Telephone Encounter (Signed)
On call provider did call Friday and was also unable to reach Miss Mcnair despite calling multiple numbers.  Called cell phone. Left detailed VM per DPR on cell phone requesting she STOP Carvedilol and call office back regarding ZIO results.   Alver Sorrow, NP

## 2023-09-27 NOTE — Addendum Note (Signed)
Addended by: Regis Bill B on: 09/27/2023 12:04 PM   Modules accepted: Orders

## 2023-09-27 NOTE — Telephone Encounter (Addendum)
Per DR Duke Salvia refer to EP, also has SVT  Referral placed, Left message to call back

## 2023-09-28 ENCOUNTER — Encounter (HOSPITAL_BASED_OUTPATIENT_CLINIC_OR_DEPARTMENT_OTHER): Payer: Self-pay | Admitting: Cardiovascular Disease

## 2023-09-28 NOTE — Telephone Encounter (Signed)
Husband Channing Mutters) stated he is returning staff call regarding patient's medication change and next steps.

## 2023-09-29 ENCOUNTER — Ambulatory Visit
Admission: RE | Admit: 2023-09-29 | Discharge: 2023-09-29 | Disposition: A | Discharge status: Home / Self Care | Payer: Medicare PPO | Source: Ambulatory Visit | Attending: Obstetrics and Gynecology | Admitting: Obstetrics and Gynecology

## 2023-09-29 ENCOUNTER — Encounter (HOSPITAL_COMMUNITY): Payer: Self-pay

## 2023-09-29 DIAGNOSIS — Z1231 Encounter for screening mammogram for malignant neoplasm of breast: Secondary | ICD-10-CM

## 2023-09-29 NOTE — Addendum Note (Signed)
Addended by: Regis Bill B on: 09/29/2023 11:24 AM   Modules accepted: Orders

## 2023-09-29 NOTE — Telephone Encounter (Signed)
This encounter was created in error - please disregard.

## 2023-09-29 NOTE — Telephone Encounter (Addendum)
Advised husband, verbalized understanding  If he does not hear from EP in 2 weeks asked him to follow up with office

## 2023-10-12 ENCOUNTER — Encounter (INDEPENDENT_AMBULATORY_CARE_PROVIDER_SITE_OTHER): Payer: Medicare PPO | Admitting: Ophthalmology

## 2023-10-12 DIAGNOSIS — Z794 Long term (current) use of insulin: Secondary | ICD-10-CM | POA: Diagnosis not present

## 2023-10-12 DIAGNOSIS — H35033 Hypertensive retinopathy, bilateral: Secondary | ICD-10-CM | POA: Diagnosis not present

## 2023-10-12 DIAGNOSIS — I10 Essential (primary) hypertension: Secondary | ICD-10-CM | POA: Diagnosis not present

## 2023-10-12 DIAGNOSIS — E113513 Type 2 diabetes mellitus with proliferative diabetic retinopathy with macular edema, bilateral: Secondary | ICD-10-CM | POA: Diagnosis not present

## 2023-10-12 DIAGNOSIS — H43813 Vitreous degeneration, bilateral: Secondary | ICD-10-CM

## 2023-10-24 ENCOUNTER — Observation Stay (HOSPITAL_COMMUNITY)
Admission: EM | Admit: 2023-10-24 | Discharge: 2023-11-01 | Disposition: A | Discharge status: Skilled Nursing Facility | Payer: Medicare PPO | Attending: Family Medicine | Admitting: Family Medicine

## 2023-10-24 ENCOUNTER — Other Ambulatory Visit: Payer: Self-pay

## 2023-10-24 ENCOUNTER — Encounter (HOSPITAL_COMMUNITY): Payer: Self-pay

## 2023-10-24 ENCOUNTER — Emergency Department (HOSPITAL_COMMUNITY): Payer: Medicare PPO

## 2023-10-24 DIAGNOSIS — I509 Heart failure, unspecified: Secondary | ICD-10-CM | POA: Insufficient documentation

## 2023-10-24 DIAGNOSIS — I132 Hypertensive heart and chronic kidney disease with heart failure and with stage 5 chronic kidney disease, or end stage renal disease: Secondary | ICD-10-CM | POA: Diagnosis not present

## 2023-10-24 DIAGNOSIS — R7989 Other specified abnormal findings of blood chemistry: Secondary | ICD-10-CM | POA: Diagnosis present

## 2023-10-24 DIAGNOSIS — R0789 Other chest pain: Principal | ICD-10-CM | POA: Insufficient documentation

## 2023-10-24 DIAGNOSIS — G40909 Epilepsy, unspecified, not intractable, without status epilepticus: Secondary | ICD-10-CM

## 2023-10-24 DIAGNOSIS — K219 Gastro-esophageal reflux disease without esophagitis: Secondary | ICD-10-CM | POA: Diagnosis not present

## 2023-10-24 DIAGNOSIS — Z8669 Personal history of other diseases of the nervous system and sense organs: Secondary | ICD-10-CM | POA: Insufficient documentation

## 2023-10-24 DIAGNOSIS — I739 Peripheral vascular disease, unspecified: Secondary | ICD-10-CM | POA: Diagnosis not present

## 2023-10-24 DIAGNOSIS — J45909 Unspecified asthma, uncomplicated: Secondary | ICD-10-CM | POA: Insufficient documentation

## 2023-10-24 DIAGNOSIS — E119 Type 2 diabetes mellitus without complications: Secondary | ICD-10-CM

## 2023-10-24 DIAGNOSIS — E1122 Type 2 diabetes mellitus with diabetic chronic kidney disease: Secondary | ICD-10-CM | POA: Diagnosis not present

## 2023-10-24 DIAGNOSIS — I160 Hypertensive urgency: Secondary | ICD-10-CM | POA: Diagnosis present

## 2023-10-24 DIAGNOSIS — Z8673 Personal history of transient ischemic attack (TIA), and cerebral infarction without residual deficits: Secondary | ICD-10-CM | POA: Diagnosis not present

## 2023-10-24 DIAGNOSIS — R778 Other specified abnormalities of plasma proteins: Secondary | ICD-10-CM | POA: Diagnosis not present

## 2023-10-24 DIAGNOSIS — Z79899 Other long term (current) drug therapy: Secondary | ICD-10-CM | POA: Insufficient documentation

## 2023-10-24 DIAGNOSIS — Z89511 Acquired absence of right leg below knee: Secondary | ICD-10-CM | POA: Diagnosis not present

## 2023-10-24 DIAGNOSIS — N186 End stage renal disease: Secondary | ICD-10-CM | POA: Diagnosis not present

## 2023-10-24 DIAGNOSIS — Z794 Long term (current) use of insulin: Secondary | ICD-10-CM | POA: Insufficient documentation

## 2023-10-24 DIAGNOSIS — E669 Obesity, unspecified: Secondary | ICD-10-CM | POA: Insufficient documentation

## 2023-10-24 DIAGNOSIS — R413 Other amnesia: Secondary | ICD-10-CM | POA: Diagnosis present

## 2023-10-24 DIAGNOSIS — Z992 Dependence on renal dialysis: Secondary | ICD-10-CM | POA: Diagnosis not present

## 2023-10-24 DIAGNOSIS — D631 Anemia in chronic kidney disease: Secondary | ICD-10-CM | POA: Diagnosis not present

## 2023-10-24 DIAGNOSIS — Z7982 Long term (current) use of aspirin: Secondary | ICD-10-CM | POA: Insufficient documentation

## 2023-10-24 DIAGNOSIS — E785 Hyperlipidemia, unspecified: Secondary | ICD-10-CM | POA: Diagnosis not present

## 2023-10-24 DIAGNOSIS — E876 Hypokalemia: Secondary | ICD-10-CM | POA: Diagnosis present

## 2023-10-24 DIAGNOSIS — F5101 Primary insomnia: Secondary | ICD-10-CM | POA: Diagnosis not present

## 2023-10-24 DIAGNOSIS — M791 Myalgia, unspecified site: Secondary | ICD-10-CM

## 2023-10-24 DIAGNOSIS — Z23 Encounter for immunization: Secondary | ICD-10-CM | POA: Insufficient documentation

## 2023-10-24 DIAGNOSIS — Z6831 Body mass index (BMI) 31.0-31.9, adult: Secondary | ICD-10-CM | POA: Insufficient documentation

## 2023-10-24 DIAGNOSIS — I6930 Unspecified sequelae of cerebral infarction: Secondary | ICD-10-CM

## 2023-10-24 DIAGNOSIS — I2510 Atherosclerotic heart disease of native coronary artery without angina pectoris: Secondary | ICD-10-CM | POA: Insufficient documentation

## 2023-10-24 DIAGNOSIS — R079 Chest pain, unspecified: Secondary | ICD-10-CM | POA: Diagnosis not present

## 2023-10-24 DIAGNOSIS — G4700 Insomnia, unspecified: Secondary | ICD-10-CM | POA: Diagnosis not present

## 2023-10-24 LAB — GLUCOSE, CAPILLARY
Glucose-Capillary: 111 mg/dL — ABNORMAL HIGH (ref 70–99)
Glucose-Capillary: 122 mg/dL — ABNORMAL HIGH (ref 70–99)

## 2023-10-24 LAB — CBC
HCT: 28.9 % — ABNORMAL LOW (ref 36.0–46.0)
Hemoglobin: 10 g/dL — ABNORMAL LOW (ref 12.0–15.0)
MCH: 28.7 pg (ref 26.0–34.0)
MCHC: 34.6 g/dL (ref 30.0–36.0)
MCV: 83 fL (ref 80.0–100.0)
Platelets: 442 10*3/uL — ABNORMAL HIGH (ref 150–400)
RBC: 3.48 MIL/uL — ABNORMAL LOW (ref 3.87–5.11)
RDW: 18.6 % — ABNORMAL HIGH (ref 11.5–15.5)
WBC: 7.9 10*3/uL (ref 4.0–10.5)
nRBC: 0 % (ref 0.0–0.2)

## 2023-10-24 LAB — COMPREHENSIVE METABOLIC PANEL
ALT: 14 U/L (ref 0–44)
AST: 18 U/L (ref 15–41)
Albumin: 3.1 g/dL — ABNORMAL LOW (ref 3.5–5.0)
Alkaline Phosphatase: 123 U/L (ref 38–126)
Anion gap: 12 (ref 5–15)
BUN: 19 mg/dL (ref 8–23)
CO2: 26 mmol/L (ref 22–32)
Calcium: 8.2 mg/dL — ABNORMAL LOW (ref 8.9–10.3)
Chloride: 94 mmol/L — ABNORMAL LOW (ref 98–111)
Creatinine, Ser: 4.01 mg/dL — ABNORMAL HIGH (ref 0.44–1.00)
GFR, Estimated: 11 mL/min — ABNORMAL LOW (ref >60)
Glucose, Bld: 126 mg/dL — ABNORMAL HIGH (ref 70–99)
Potassium: 3.3 mmol/L — ABNORMAL LOW (ref 3.5–5.1)
Sodium: 132 mmol/L — ABNORMAL LOW (ref 135–145)
Total Bilirubin: 0.4 mg/dL (ref 0.0–1.2)
Total Protein: 7.5 g/dL (ref 6.5–8.1)

## 2023-10-24 LAB — URINALYSIS, ROUTINE W REFLEX MICROSCOPIC
Bilirubin Urine: NEGATIVE
Glucose, UA: 150 mg/dL — AB
Hgb urine dipstick: NEGATIVE
Ketones, ur: NEGATIVE mg/dL
Leukocytes,Ua: NEGATIVE
Nitrite: NEGATIVE
Protein, ur: >=300 mg/dL — AB
Specific Gravity, Urine: 1.016 (ref 1.005–1.030)
pH: 9 — ABNORMAL HIGH (ref 5.0–8.0)

## 2023-10-24 LAB — CK: Total CK: 100 U/L (ref 38–234)

## 2023-10-24 LAB — BRAIN NATRIURETIC PEPTIDE: B Natriuretic Peptide: 1014.6 pg/mL — ABNORMAL HIGH (ref 0.0–100.0)

## 2023-10-24 LAB — TROPONIN I (HIGH SENSITIVITY)
Troponin I (High Sensitivity): 66 ng/L — ABNORMAL HIGH (ref <18)
Troponin I (High Sensitivity): 57 ng/L — ABNORMAL HIGH (ref <18)

## 2023-10-24 MED ORDER — ONDANSETRON HCL 4 MG/2ML IJ SOLN
4.0000 mg | Freq: Four times a day (QID) | INTRAMUSCULAR | Status: DC | PRN
  Filled 2023-10-24: qty 2

## 2023-10-24 MED ORDER — LOSARTAN POTASSIUM 50 MG PO TABS
50.0000 mg | ORAL_TABLET | Freq: Every day | ORAL | Status: DC
  Administered 2023-10-25: 50 mg via ORAL
  Filled 2023-10-24: qty 1

## 2023-10-24 MED ORDER — ONDANSETRON HCL 4 MG PO TABS: 4.0000 mg | ORAL_TABLET | Freq: Four times a day (QID) | ORAL | Status: DC | PRN

## 2023-10-24 MED ORDER — ACETAMINOPHEN 325 MG PO TABS
650.0000 mg | ORAL_TABLET | Freq: Four times a day (QID) | ORAL | Status: DC | PRN
  Administered 2023-10-25 – 2023-11-01 (×15): 650 mg via ORAL
  Filled 2023-10-24 (×18): qty 2

## 2023-10-24 MED ORDER — IOHEXOL 350 MG/ML SOLN
80.0000 mL | Freq: Once | INTRAVENOUS | Status: AC | PRN
  Administered 2023-10-24: 80 mL via INTRAVENOUS

## 2023-10-24 MED ORDER — LEVETIRACETAM 250 MG PO TABS
375.0000 mg | ORAL_TABLET | ORAL | Status: DC
  Administered 2023-10-27 – 2023-10-30 (×2): 375 mg via ORAL
  Filled 2023-10-24 (×4): qty 1.5

## 2023-10-24 MED ORDER — TICAGRELOR 90 MG PO TABS
90.0000 mg | ORAL_TABLET | Freq: Two times a day (BID) | ORAL | Status: DC
  Administered 2023-10-24 – 2023-11-01 (×16): 90 mg via ORAL
  Filled 2023-10-24 (×17): qty 1

## 2023-10-24 MED ORDER — FENTANYL CITRATE PF 50 MCG/ML IJ SOSY
50.0000 ug | PREFILLED_SYRINGE | Freq: Once | INTRAMUSCULAR | Status: AC
  Administered 2023-10-24: 50 ug via INTRAVENOUS
  Filled 2023-10-24: qty 1

## 2023-10-24 MED ORDER — ASPIRIN 81 MG PO CHEW
324.0000 mg | CHEWABLE_TABLET | Freq: Once | ORAL | Status: AC
  Administered 2023-10-24: 324 mg via ORAL
  Filled 2023-10-24: qty 4

## 2023-10-24 MED ORDER — ROSUVASTATIN CALCIUM 20 MG PO TABS
20.0000 mg | ORAL_TABLET | Freq: Every day | ORAL | Status: DC
  Administered 2023-10-24 – 2023-10-31 (×8): 20 mg via ORAL
  Filled 2023-10-24 (×9): qty 1

## 2023-10-24 MED ORDER — SODIUM CHLORIDE 0.9% FLUSH
3.0000 mL | Freq: Two times a day (BID) | INTRAVENOUS | Status: DC
  Administered 2023-10-24 – 2023-11-01 (×15): 3 mL via INTRAVENOUS

## 2023-10-24 MED ORDER — AMITRIPTYLINE HCL 25 MG PO TABS
50.0000 mg | ORAL_TABLET | Freq: Every day | ORAL | Status: DC
  Administered 2023-10-24 – 2023-10-31 (×8): 50 mg via ORAL
  Filled 2023-10-24 (×10): qty 2

## 2023-10-24 MED ORDER — ACETAMINOPHEN 650 MG RE SUPP: 650.0000 mg | Freq: Four times a day (QID) | RECTAL | Status: DC | PRN

## 2023-10-24 MED ORDER — SEVELAMER CARBONATE 0.8 G PO PACK
0.8000 g | PACK | Freq: Three times a day (TID) | ORAL | Status: DC
  Administered 2023-10-24 – 2023-11-01 (×18): 0.8 g via ORAL
  Filled 2023-10-24 (×26): qty 1

## 2023-10-24 MED ORDER — CHLORHEXIDINE GLUCONATE CLOTH 2 % EX PADS
6.0000 | MEDICATED_PAD | Freq: Every day | CUTANEOUS | Status: DC
  Administered 2023-10-24 – 2023-10-25 (×2): 6 via TOPICAL

## 2023-10-24 MED ORDER — MELATONIN 5 MG PO TABS
5.0000 mg | ORAL_TABLET | Freq: Every day | ORAL | Status: DC
  Administered 2023-10-24 – 2023-10-31 (×8): 5 mg via ORAL
  Filled 2023-10-24 (×10): qty 1

## 2023-10-24 MED ORDER — GABAPENTIN 300 MG PO CAPS
300.0000 mg | ORAL_CAPSULE | Freq: Every day | ORAL | Status: DC
  Administered 2023-10-24 – 2023-11-01 (×9): 300 mg via ORAL
  Filled 2023-10-24 (×9): qty 1

## 2023-10-24 MED ORDER — DICLOFENAC SODIUM 1 % EX GEL
2.0000 g | Freq: Four times a day (QID) | CUTANEOUS | Status: DC
  Administered 2023-10-24 – 2023-11-01 (×28): 2 g via TOPICAL
  Filled 2023-10-24: qty 100

## 2023-10-24 MED ORDER — LEVETIRACETAM 750 MG PO TABS
750.0000 mg | ORAL_TABLET | Freq: Every day | ORAL | Status: DC
  Administered 2023-10-24 – 2023-10-31 (×8): 750 mg via ORAL
  Filled 2023-10-24 (×9): qty 1

## 2023-10-24 MED ORDER — PANTOPRAZOLE SODIUM 40 MG PO TBEC
40.0000 mg | DELAYED_RELEASE_TABLET | Freq: Every day | ORAL | Status: DC
  Administered 2023-10-25 – 2023-10-31 (×7): 40 mg via ORAL
  Filled 2023-10-24 (×9): qty 1

## 2023-10-24 MED ORDER — HEPARIN SODIUM (PORCINE) 5000 UNIT/ML IJ SOLN
5000.0000 [IU] | Freq: Three times a day (TID) | INTRAMUSCULAR | Status: DC
Start: 2023-10-24 — End: 2023-11-02
  Administered 2023-10-24 – 2023-11-01 (×22): 5000 [IU] via SUBCUTANEOUS
  Filled 2023-10-24 (×22): qty 1

## 2023-10-24 MED ORDER — ASPIRIN 81 MG PO TBEC
81.0000 mg | DELAYED_RELEASE_TABLET | Freq: Every day | ORAL | Status: DC
  Administered 2023-10-25 – 2023-11-01 (×8): 81 mg via ORAL
  Filled 2023-10-24 (×8): qty 1

## 2023-10-24 MED ORDER — LORATADINE 10 MG PO TABS
10.0000 mg | ORAL_TABLET | Freq: Every day | ORAL | Status: DC
  Administered 2023-10-25 – 2023-11-01 (×8): 10 mg via ORAL
  Filled 2023-10-24 (×8): qty 1

## 2023-10-24 MED ORDER — CALCITRIOL 0.5 MCG PO CAPS
0.5000 ug | ORAL_CAPSULE | ORAL | Status: DC
  Administered 2023-10-25 – 2023-11-01 (×4): 0.5 ug via ORAL
  Filled 2023-10-24 (×5): qty 1

## 2023-10-24 MED ORDER — CINACALCET HCL 30 MG PO TABS
30.0000 mg | ORAL_TABLET | Freq: Every day | ORAL | Status: DC
  Administered 2023-10-25 – 2023-11-01 (×8): 30 mg via ORAL
  Filled 2023-10-24 (×9): qty 1

## 2023-10-24 MED ORDER — MOMETASONE FURO-FORMOTEROL FUM 200-5 MCG/ACT IN AERO
2.0000 | INHALATION_SPRAY | Freq: Two times a day (BID) | RESPIRATORY_TRACT | Status: DC
  Administered 2023-10-25 – 2023-11-01 (×13): 2 via RESPIRATORY_TRACT
  Filled 2023-10-24: qty 8.8

## 2023-10-24 MED ORDER — HYDRALAZINE HCL 20 MG/ML IJ SOLN
10.0000 mg | INTRAMUSCULAR | Status: DC | PRN
  Administered 2023-10-25 – 2023-10-30 (×2): 10 mg via INTRAVENOUS
  Filled 2023-10-24 (×2): qty 1

## 2023-10-24 MED ORDER — HYDRALAZINE HCL 50 MG PO TABS
50.0000 mg | ORAL_TABLET | Freq: Two times a day (BID) | ORAL | Status: DC
  Administered 2023-10-24 – 2023-10-25 (×3): 50 mg via ORAL
  Filled 2023-10-24 (×3): qty 1

## 2023-10-24 MED ORDER — ALBUTEROL SULFATE (2.5 MG/3ML) 0.083% IN NEBU
2.5000 mg | INHALATION_SOLUTION | Freq: Four times a day (QID) | RESPIRATORY_TRACT | Status: DC | PRN
  Administered 2023-10-25: 2.5 mg via RESPIRATORY_TRACT
  Filled 2023-10-24: qty 3

## 2023-10-24 NOTE — ED Triage Notes (Signed)
 Pt from dialysis center. C/O left chest, shoulder, and in between shoulder blades pain for 2 weeks. Dialysis gave tylenol. Pt did not complete dialysis tx. Axox4. 190 systolic.

## 2023-10-24 NOTE — ED Notes (Signed)
Pt's husband is an Scientist, research (life sciences) here. Would like him contacted to let him know she is here.

## 2023-10-24 NOTE — ED Provider Notes (Signed)
 [facility] Provider Note   CSN: [id number] Arrival date & time: 10/24/23  1054     History  Chief Complaint  Patient presents with   Chest Pain    Carolyn Russo is a 72 y.o. female.  HPI 72 year old female end-stage renal disease on dialysis, type 2 diabetes, obesity, dyslipidemia, coronary artery disease, upper cholesterolemia, presents today with complaints of left chest, shoulder, and pain radiating into her back for the past 2 weeks.  She states that she is having pain all over.  She states this began after she had a mammogram.     Home Medications Prior to Admission medications   Medication Sig Start Date End Date Taking? Authorizing Provider  acetaminophen  (TYLENOL ) 500 MG tablet Take 1,000 mg by mouth 2 (two) times daily as needed (leg and arm pain).    [provider]  albuterol  (PROVENTIL ) (2.5 MG/3ML) 0.083% nebulizer solution Use 1 vial (2.5 mg total) by nebulization every 4 (four) hours as needed for wheezing or shortness of breath. 02/14/23 02/14/24  Vann, Jessica U, DO  amitriptyline  (ELAVIL ) 25 MG tablet Take 0.5-1 tablets (12.5-25 mg total) by mouth at bedtime. 09/11/23   Lovorn, Megan, MD  aspirin  EC 81 MG tablet Take 1 tablet (81 mg total) by mouth daily. Swallow whole. 06/10/22   Leotis Bogus, MD  cinacalcet  (SENSIPAR ) 30 MG tablet Take 30 mg by mouth daily.    [provider]  diclofenac  Sodium (VOLTAREN ) 1 % GEL Apply 2 g topically 4 (four) times daily. To left wrist 05/26/21   Love, Sharlet RAMAN, PA-C  gabapentin  (NEURONTIN ) 300 MG capsule Take 1 capsule (300 mg total) by mouth at bedtime. 09/11/23   Lovorn, Megan, MD  Glucagon, rDNA, (GLUCAGON EMERGENCY) 1 MG KIT Inject 1 mg into the vein as needed (CBG of 65mg /dL).    [provider]  hydrALAZINE  (APRESOLINE ) 25 MG tablet Take 1 tablet (25 mg total) by mouth in the morning and at bedtime. 01/10/23   Raford Riggs, MD  hydrocortisone   (ANUSOL -HC) 2.5 % rectal cream Place rectally 4 (four) times daily. 02/14/23   Vann, Jessica U, DO  insulin  aspart (NOVOLOG ) 100 UNIT/ML injection Inject 0-6 Units into the skin 3 (three) times daily with meals. CBG < 70: Implement Hypoglycemia Standing Orders and refer to Hypoglycemia Standing Orders sidebar report CBG 70 - 120: 0 units CBG 121 - 150: 0 units CBG 151 - 200: 1 unit CBG 201-250: 2 units CBG 251-300: 3 units CBG 301-350: 4 units CBG 351-400: 5 units CBG > 400: Give 6 units and call MD 06/29/22   Pokhrel, Laxman, MD  insulin  glargine (LANTUS ) 100 UNIT/ML Solostar Pen Inject 5 Units into the skin at bedtime. 06/29/22   Pokhrel, Laxman, MD  levETIRAcetam  (KEPPRA ) 250 MG tablet Take 1.5 tablets (375 mg total) by mouth every Monday, Wednesday, and Friday at 6 PM. 06/29/22   Pokhrel, Vernal, MD  levETIRAcetam  (KEPPRA ) 750 MG tablet Take 1 tablet (750 mg total) by mouth at bedtime. 06/29/22   Pokhrel, Laxman, MD  loratadine  (CLARITIN ) 10 MG tablet Take 10 mg by mouth daily.    [provider]  losartan  (COZAAR ) 50 MG tablet Take 50 mg by mouth daily.    [provider]  methylphenidate  (RITALIN ) 5 MG tablet Take 1 tablet (5 mg total) by mouth 2 (two) times daily with breakfast and lunch. 09/11/23   Lovorn, Duwaine, MD  mometasone -formoterol  (DULERA ) 200-5 MCG/ACT AERO Inhale 2 puffs into  the lungs 2 (two) times daily. 04/15/22   Jarold Medici, MD  pantoprazole  (PROTONIX ) 40 MG tablet Take 1 tablet (40 mg total) by mouth daily. Patient taking differently: Take 40 mg by mouth at bedtime. 02/07/22   Jarold Medici, MD  prochlorperazine  (COMPAZINE ) 10 MG tablet Take 1 tablet (10 mg total) by mouth every 8 (eight) hours as needed for nausea or vomiting. 02/15/23   Legrand Victory LITTIE DOUGLAS, MD  rosuvastatin  (CRESTOR ) 20 MG tablet Take 1 tablet (20 mg total) by mouth daily. Patient taking differently: Take 20 mg by mouth at bedtime. 08/18/21   Ghumman, Ramandeep, NP  sevelamer  carbonate  (RENVELA ) 0.8 g PACK packet Take 0.8 g by mouth 3 (three) times daily. 10/19/21   [provider]  ticagrelor  (BRILINTA ) 90 MG TABS tablet Take 1 tablet (90 mg total) by mouth 2 (two) times daily. 06/10/22   Leotis Bogus, MD  traZODone  (DESYREL ) 100 MG tablet Take 1 tablet (100 mg total) by mouth at bedtime as needed for sleep (sleep). 08/07/23   Lovorn, Megan, MD      Allergies    Almond oil, Food, Wheat, Statins, Gadolinium derivatives, Januvia  [sitagliptin ], Pork-derived products, Shellfish allergy, Tetracycline, and Tetracycline hcl    Review of Systems   Review of Systems  Physical Exam Updated Vital Signs BP 116/69   Pulse 83   Temp 97.9 F (36.6 C) (Oral)   Resp 17   Ht 1.6 m (5' 3)   Wt 79.4 kg   SpO2 98%   BMI 31.00 kg/m  Physical Exam Vitals reviewed.  HENT:     Head: Normocephalic.  Eyes:     Pupils: Pupils are equal, round, and reactive to light.  Cardiovascular:     Rate and Rhythm: Normal rate and regular rhythm.     Heart sounds: Normal heart sounds.  Pulmonary:     Effort: Pulmonary effort is normal.     Breath sounds: Normal breath sounds.  Abdominal:     Palpations: Abdomen is soft.  Musculoskeletal:        General: Normal range of motion.     Cervical back: Normal range of motion.     Comments: Right BKA with prosthesis in place  Skin:    General: Skin is warm.     Capillary Refill: Capillary refill takes less than 2 seconds.  Neurological:     General: No focal deficit present.     Mental Status: She is alert.     ED Results / Procedures / Treatments   Labs (all labs ordered are listed, but only abnormal results are displayed) Labs Reviewed  CBC - Abnormal; Notable for the following components:      Result Value   RBC 3.48 (*)    Hemoglobin 10.0 (*)    HCT 28.9 (*)    RDW 18.6 (*)    Platelets 442 (*)    All other components within normal limits  COMPREHENSIVE METABOLIC PANEL - Abnormal; Notable for the following  components:   Sodium 132 (*)    Potassium 3.3 (*)    Chloride 94 (*)    Glucose, Bld 126 (*)    Creatinine, Ser 4.01 (*)    Calcium  8.2 (*)    Albumin  3.1 (*)    GFR, Estimated 11 (*)    All other components within normal limits  TROPONIN I (HIGH SENSITIVITY) - Abnormal; Notable for the following components:   Troponin I (High Sensitivity) 66 (*)    All other components within  normal limits  CK  TROPONIN I (HIGH SENSITIVITY)    EKG EKG Interpretation Date/Time:  Tuesday October 24 2023 11:06:51 EST Ventricular Rate:  102 PR Interval:  152 QRS Duration:  98 QT Interval:  392 QTC Calculation: 510 R Axis:   217  Text Interpretation: Sinus tachycardia with Premature atrial complexes with Abberant conduction Right superior axis deviation Cannot rule out Anterior infarct , age undetermined Abnormal ECG When compared with ECG of 09-Feb-2023 13:46, PREVIOUS ECG IS PRESENT Confirmed by Levander Houston [phone]) on 10/24/2023 12:06:54 PM  Radiology DG Chest 2 View Result Date: 10/24/2023 CLINICAL DATA:  Left shoulder and chest pain. EXAM: CHEST - 2 VIEW COMPARISON:  02/12/2023 FINDINGS: Right IJ dialysis catheter tip in the right atrium. Stable cardiomediastinal silhouette. Aortic atherosclerotic calcification. Pulmonary vascular congestion. No focal consolidation, pleural effusion, or pneumothorax. No displaced rib fractures. IMPRESSION: No change from 02/12/2023.  Mild pulmonary vascular congestion. Electronically Signed   By: Norman Gatlin M.D.   On: 10/24/2023 13:40    Procedures Procedures    Medications Ordered in ED Medications  fentaNYL  (SUBLIMAZE ) injection 50 mcg (50 mcg Intravenous Given 10/24/23 1223)  iohexol  (OMNIPAQUE ) 350 MG/ML injection 80 mL (80 mLs Intravenous Contrast Given 10/24/23 1417)  aspirin  chewable tablet 324 mg (324 mg Oral Given 10/24/23 1519)    ED Course/ Medical Decision Making/ A&P                                  Medical Decision  Making Amount and/or Complexity of Data Reviewed Labs: ordered. Radiology: ordered.  Risk OTC drugs.   72 year old female with history of known coronary artery disease, hypertension, CVA, end-stage renal disease on dialysis, presents today with complaints of 2 weeks of chest pain.  It was worsening and radiating through her chest today.  Patient is here in the ED for full evaluation for this.  EKG without acutely ischemic changes.  First troponin is elevated at 66.  I reviewed priors and they have been around 30.  CBC is stable without severe anemia hemoglobin is 10.  Complete metabolic panel significant for mild hyponatremia and hypokalemia with known renal failure. Patient's has been states that she had some bradycardia and had her beta-blocker stopped earlier this month.  Her blood pressures have been running higher since that time.  Blood pressure on initial evaluation was 177/107.  She has decreased her blood pressure here to 116/69. Discussed with Dr. Loni.  She does not think they need to see acutely.  Advises bp management and trend troponins. Total ck pending Care discussed with Dr. Claudene who will see for admission       Final Clinical Impression(s) / ED Diagnoses Final diagnoses:  Chest pain, unspecified type  Myalgia  ESRD (end stage renal disease) [facility])    Rx / DC Orders ED Discharge Orders     None         Levander Houston, MD 10/24/23 1530

## 2023-10-24 NOTE — ED Notes (Signed)
 ED TO INPATIENT HANDOFF REPORT  ED Nurse Name and Phone #: Bruno samella RAMAN Name/Age/Gender Maple JULIANNA Quan 72 y.o. female Room/Bed: 001C/001C  Code Status   Code Status: Full Code  Home/SNF/Other Home Patient oriented to: self, place, time, and situation Is this baseline? Yes   Triage Complete: Triage complete  Chief Complaint Chest pain [R07.9]  Triage Note Pt from dialysis center. C/O left chest, shoulder, and in between shoulder blades pain for 2 weeks. Dialysis gave tylenol . Pt did not complete dialysis tx. Axox4. 190 systolic.    Allergies Allergies  Allergen Reactions   Almond Oil Anaphylaxis   Food Anaphylaxis    Peanuts - anaphylaxis Almonds - anaphylaxis Not listed on MAR   Wheat Other (See Comments)    Upset stomach   Not listed on MAR  Other Reaction(s): abdominal pain   Statins Itching and Other (See Comments)    Generalized aches- tolerates crestor   Not listed on MAR   Gadolinium Derivatives Other (See Comments)    Gadolinium-Containing Contrast Media Listed as an allergy on MAR Unknown reaction   Januvia  [Sitagliptin ] Other (See Comments)    Listed as an allergy on MAR Unknown reaction   Pork-Derived Products Other (See Comments)    Does not eat pork  Listed on MAR   Shellfish Allergy Other (See Comments)    Mouth gets raw Listed on MAR   Tetracycline Other (See Comments)    Raw mouth  Not listed on MAR  Other Reaction(s): Not available   Tetracycline Hcl     Other reaction(s): raw mouth  Other Reaction(s): raw mouth    Level of Care/Admitting Diagnosis ED Disposition     ED Disposition  Admit   Condition  --   Comment  Hospital Area: [facility] [[id number]]  Level of Care: Telemetry Cardiac [103]  May place patient in observation at [facility] or [facility] if equivalent level of care is available:: No  Covid Evaluation: Asymptomatic - no recent exposure (last 10 days) testing not required  Diagnosis:  Chest pain [255200]  Admitting Physician: CLAUDENE MAXIMINO LABOR [[id number]]  Attending Physician: CLAUDENE MAXIMINO LABOR [[id number]]          B Medical/Surgery History Past Medical History:  Diagnosis Date   Acute GI bleeding    Allergy    Anemia    Anterior chest wall pain    Appendicitis 1965   Asthma    Body mass index 37.0-37.9, adult    Breast pain    Cataract    both eyes   CHF (congestive heart failure) (HCC)    Chronic kidney disease    stage 5 - on dialysis   Cognitive change 04/20/2021   r/t cva 03/2821   Complication of anesthesia    Memory loss after general   Dehydration 2014   Deviated septum 1971   Diabetes mellitus    Dysphagia due to old stroke    easy to get strangled when eating   Dyspnea 2014   Extrinsic asthma    WITH ASTHMA ATTACK   Fibroid 1980   GERD (gastroesophageal reflux disease)    Heart murmur    History of migraine    History of seizure    with stroke   Hx gestational diabetes    Hyperlipidemia    Hypertension 2014   Inguinal hernia 1959   Malaise and fatigue 2014   Non-IgE mediated allergic asthma 2014   Obesity    Pelvic pain    Pregnancy,  high-risk 1985   Stroke (HCC) 04/20/2021   (CVA) of right basal ganglia   Tonsillitis 1968   Uterine fibroid 1980   Visual field defect    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   Past Surgical History:  Procedure Laterality Date   ABDOMINAL AORTOGRAM W/LOWER EXTREMITY N/A 02/21/2022   Procedure: ABDOMINAL AORTOGRAM W/LOWER EXTREMITY;  Surgeon: Sheree Penne Bruckner, MD;  Location: [facility] INVASIVE CV LAB;  Service: Cardiovascular;  Laterality: N/A;   AMPUTATION Right 05/18/2022   Procedure: RIGHT BELOW KNEE AMPUTATION;  Surgeon: Harden Jerona GAILS, MD;  Location: [facility] OR;  Service: Orthopedics;  Laterality: Right;   AMPUTATION TOE Right 04/15/2022   Procedure: AMPUTATION  LST TOERIGHT FOOT;  Surgeon: Silva Juliene SAUNDERS, DPM;  Location: [facility] ORS;  Service: Podiatry;  Laterality: Right;   APPENDECTOMY  1959   BASCILIC VEIN  TRANSPOSITION Right 04/30/2021   Procedure: RIGHT FIRST STAGE BASCILIC VEIN TRANSPOSITION;  Surgeon: Eliza Bruckner RAMAN, MD;  Location: [facility] OR;  Service: Vascular;  Laterality: Right;   CESAREAN SECTION  1985   COLONOSCOPY     ESOPHAGOGASTRODUODENOSCOPY (EGD) WITH PROPOFOL  N/A 04/18/2021   Procedure: ESOPHAGOGASTRODUODENOSCOPY (EGD) WITH PROPOFOL ;  Surgeon: Avram Lupita BRAVO, MD;  Location: [facility] ENDOSCOPY;  Service: Endoscopy;  Laterality: N/A;   EYE SURGERY     bilateral cataract    FLEXIBLE SIGMOIDOSCOPY N/A 04/18/2021   Procedure: FLEXIBLE SIGMOIDOSCOPY;  Surgeon: Avram Lupita BRAVO, MD;  Location: [facility] ENDOSCOPY;  Service: Endoscopy;  Laterality: N/A;   HERNIA REPAIR  1959   IR FLUORO GUIDE CV LINE RIGHT  03/18/2021   IR US  GUIDE VASC ACCESS RIGHT  03/18/2021   LEFT HEART CATH AND CORONARY ANGIOGRAPHY N/A 04/04/2017   Procedure: Left Heart Cath and Coronary Angiography;  Surgeon: Claudene Victory ORN, MD;  Location: [facility] INVASIVE CV LAB;  Service: Cardiovascular;  Laterality: N/A;   MYOMECTOMY  1980, 2004, 2007   PERIPHERAL VASCULAR THROMBECTOMY  02/21/2022   Procedure: PERIPHERAL VASCULAR THROMBECTOMY;  Surgeon: Sheree Penne Bruckner, MD;  Location: [facility] INVASIVE CV LAB;  Service: Cardiovascular;;  Right Tibial   RHINOPLASTY  1971   ROTATOR CUFF REPAIR  2003   SURGICAL REPAIR OF HEMORRHAGE  2015   TONSILLECTOMY  1968     A IV Location/Drains/Wounds Patient Lines/Drains/Airways Status     Active Line/Drains/Airways     Name Placement date Placement time Site Days   Peripheral IV 10/24/23 20 G Anterior;Proximal;Right Forearm 10/24/23  1222  Forearm  less than 1   Fistula / Graft Right Upper arm Arteriovenous fistula 04/30/21  1429  Upper arm  907   Hemodialysis Catheter Right Subclavian --  --  Subclavian  --   Wound / Incision (Open or Dehisced) 06/03/22 Other (Comment) Sacrum Mid 06/03/22  1215  Sacrum  508            Intake/Output Last 24 hours No intake or output data in the 24 hours  ending 10/24/23 1649  Labs/Imaging Results for orders placed or performed during the hospital encounter of 10/24/23 (from the past 48 hours)  Comprehensive metabolic panel     Status: Abnormal   Collection Time: 10/24/23 11:25 AM  Result Value Ref Range   Sodium 132 (L) 135 - 145 mmol/L   Potassium 3.3 (L) 3.5 - 5.1 mmol/L   Chloride 94 (L) 98 - 111 mmol/L   CO2 26 22 - 32 mmol/L   Glucose, Bld 126 (H) 70 - 99 mg/dL    Comment: Glucose reference range applies only to samples  taken after fasting for at least 8 hours.   BUN 19 8 - 23 mg/dL   Creatinine, Ser 5.98 (H) 0.44 - 1.00 mg/dL   Calcium  8.2 (L) 8.9 - 10.3 mg/dL   Total Protein 7.5 6.5 - 8.1 g/dL   Albumin  3.1 (L) 3.5 - 5.0 g/dL   AST 18 15 - 41 U/L   ALT 14 0 - 44 U/L   Alkaline Phosphatase 123 38 - 126 U/L   Total Bilirubin 0.4 0.0 - 1.2 mg/dL   GFR, Estimated 11 (L) >60 mL/min    Comment: (NOTE) Calculated using the CKD-EPI Creatinine Equation (2021)    Anion gap 12 5 - 15    Comment: Performed at [facility], [address] [place], [place] [address]  CBC     Status: Abnormal   Collection Time: 10/24/23 12:20 PM  Result Value Ref Range   WBC 7.9 4.0 - 10.5 K/uL   RBC 3.48 (L) 3.87 - 5.11 MIL/uL   Hemoglobin 10.0 (L) 12.0 - 15.0 g/dL   HCT 71.0 (L) 63.9 - 53.9 %   MCV 83.0 80.0 - 100.0 fL   MCH 28.7 26.0 - 34.0 pg   MCHC 34.6 30.0 - 36.0 g/dL   RDW 81.3 (H) 88.4 - 84.4 %   Platelets 442 (H) 150 - 400 K/uL   nRBC 0.0 0.0 - 0.2 %    Comment: Performed at [facility], [address], [place], [place] [address]  Troponin I (High Sensitivity)     Status: Abnormal   Collection Time: 10/24/23 12:20 PM  Result Value Ref Range   Troponin I (High Sensitivity) 66 (H) <18 ng/L    Comment: (NOTE) Elevated high sensitivity troponin I (hsTnI) values and significant  changes across serial measurements may suggest ACS but many other  chronic and acute conditions are known to elevate hsTnI results.  Refer to the  Links section for chest pain algorithms and additional  guidance. Performed at [facility], [address], [place], [place] [address]   Brain natriuretic peptide     Status: Abnormal   Collection Time: 10/24/23 12:20 PM  Result Value Ref Range   B Natriuretic Peptide 1,014.6 (H) 0.0 - 100.0 pg/mL    Comment: Performed at [facility], [address] [place], [place] [address]   *Note: Due to a large number of results and/or encounters for the requested time period, some results have not been displayed. A complete set of results can be found in Results Review.   CT Angio Chest/Abd/Pel for Dissection W and/or Wo Contrast Result Date: 10/24/2023 CLINICAL DATA:  Two-week history of left chest and shoulder pain radiating into the back. Hypertension. EXAM: CT ANGIOGRAPHY CHEST, ABDOMEN AND PELVIS TECHNIQUE: Non-contrast CT of the chest was initially obtained. Multidetector CT imaging through the chest, abdomen and pelvis was performed using the standard protocol during bolus administration of intravenous contrast. Multiplanar reconstructed images and MIPs were obtained and reviewed to evaluate the vascular anatomy. RADIATION DOSE REDUCTION: This exam was performed according to the departmental dose-optimization program which includes automated exposure control, adjustment of the mA and/or kV according to patient size and/or use of iterative reconstruction technique. CONTRAST:  80mL OMNIPAQUE  IOHEXOL  350 MG/ML SOLN COMPARISON:  Same day chest radiograph, CT chest, abdomen, and pelvis dated 05/22/2022 FINDINGS: CTA CHEST FINDINGS Cardiovascular: Right IJ central venous catheter tip terminates in the right atrium. Preferential opacification of the thoracic aorta. No evidence of thoracic aortic  aneurysm or dissection. Mild multichamber cardiomegaly. No pericardial effusion. Coronary artery calcifications. Mediastinum/Nodes: Imaged thyroid  gland without nodules meeting criteria for imaging  follow-up by size. Patulous esophagus. Right hilar lymph node measures 11 mm. Lungs/Pleura: The central airways are patent. No focal consolidation. Subsegmental lingular and left lower lobe atelectasis. No pneumothorax. No pleural effusion. Musculoskeletal: No acute or abnormal lytic or blastic osseous lesions. Multilevel degenerative changes of the thoracic spine. Review of the MIP images confirms the above findings. CTA ABDOMEN AND PELVIS FINDINGS VASCULAR Aorta: Aortic atherosclerosis. Normal caliber aorta without aneurysm, dissection, vasculitis or significant stenosis. Celiac: Patent without evidence of aneurysm, dissection, vasculitis or significant stenosis. SMA: Patent without evidence of aneurysm, dissection, vasculitis or significant stenosis. Renals: Moderate narrowing of the right renal artery origin due to atherosclerotic plaque. No evidence of aneurysm, dissection, vasculitis, or fibromuscular dysplasia. IMA: Patent without evidence of aneurysm, dissection, vasculitis or significant stenosis. Inflow: Patent without evidence of aneurysm, dissection, vasculitis or significant stenosis. Proximal Outflow: Bilateral common femoral and visualized portions of the superficial and profunda femoral arteries are patent without evidence of aneurysm, dissection, vasculitis or significant stenosis. Veins: No obvious venous abnormality within the limitations of this arterial phase study. Review of the MIP images confirms the above findings. NON-VASCULAR Hepatobiliary: No focal hepatic lesions. No intra or extrahepatic biliary ductal dilation. Mildly distended gallbladder. Pancreas: No focal lesions or main ductal dilation. Spleen: Normal in size without focal abnormality. Adrenals/Urinary Tract: No adrenal nodules. No suspicious renal mass, calculi or hydronephrosis. No focal bladder wall thickening. Stomach/Bowel: Normal appearance of the stomach. No evidence of bowel wall thickening, distention, or inflammatory  changes. Colonic diverticulosis without acute diverticulitis. Appendix is not discretely seen. Lymphatic: No enlarged abdominal or pelvic lymph nodes. Reproductive: Scattered coarse calcifications within the uterus, likely leiomyomata. No adnexal masses. Other: Small volume presacral edema. No free air or fluid collection. Musculoskeletal: No acute or abnormal lytic or blastic osseous lesions. Multilevel degenerative changes of the lumbar spine. Small fat-containing paraumbilical and right inguinal hernias. Review of the MIP images confirms the above findings. IMPRESSION: 1. No evidence of aortic aneurysm or dissection. 2. Mildly enlarged right hilar lymph node, nonspecific. 3. Otherwise, no acute intrathoracic, intra-abdominal, or intrapelvic abnormality. 4. Mildly distended gallbladder, nonspecific. Recommend correlation with right upper quadrant tenderness. 5. Colonic diverticulosis without acute diverticulitis. 6. Aortic Atherosclerosis (ICD10-I70.0). Coronary artery calcifications. Assessment for potential risk factor modification, dietary therapy or pharmacologic therapy may be warranted, if clinically indicated. Electronically Signed   By: Limin  Xu M.D.   On: 10/24/2023 16:20   DG Chest 2 View Result Date: 10/24/2023 CLINICAL DATA:  Left shoulder and chest pain. EXAM: CHEST - 2 VIEW COMPARISON:  02/12/2023 FINDINGS: Right IJ dialysis catheter tip in the right atrium. Stable cardiomediastinal silhouette. Aortic atherosclerotic calcification. Pulmonary vascular congestion. No focal consolidation, pleural effusion, or pneumothorax. No displaced rib fractures. IMPRESSION: No change from 02/12/2023.  Mild pulmonary vascular congestion. Electronically Signed   By: Norman Gatlin M.D.   On: 10/24/2023 13:40    Pending Labs Unresulted Labs (From admission, onward)     Start     Ordered   10/25/23 0500  CBC  Tomorrow morning,   R        10/24/23 1540   10/25/23 0500  Renal function panel  Daily,   R       10/24/23 1540   10/24/23 1344  CK  Once,   URGENT        10/24/23 1343  Vitals/Pain Today's Vitals   10/24/23 1310 10/24/23 1345 10/24/23 1445 10/24/23 1647  BP:  116/69  121/66  Pulse:  83  85  Resp:  17  19  Temp:   97.9 F (36.6 C) 98 F (36.7 C)  TempSrc:   Oral Oral  SpO2:  98%  100%  Weight:      Height:      PainSc: 8    3     Isolation Precautions No active isolations  Medications Medications  sodium chloride  flush (NS) 0.9 % injection 3 mL (3 mLs Intravenous Given 10/24/23 1542)  acetaminophen  (TYLENOL ) tablet 650 mg (has no administration in time range)    Or  acetaminophen  (TYLENOL ) suppository 650 mg (has no administration in time range)  ondansetron  (ZOFRAN ) tablet 4 mg (has no administration in time range)    Or  ondansetron  (ZOFRAN ) injection 4 mg (has no administration in time range)  albuterol  (PROVENTIL ) (2.5 MG/3ML) 0.083% nebulizer solution 2.5 mg (has no administration in time range)  hydrALAZINE  (APRESOLINE ) injection 10 mg (has no administration in time range)  fentaNYL  (SUBLIMAZE ) injection 50 mcg (50 mcg Intravenous Given 10/24/23 1223)  iohexol  (OMNIPAQUE ) 350 MG/ML injection 80 mL (80 mLs Intravenous Contrast Given 10/24/23 1417)  aspirin  chewable tablet 324 mg (324 mg Oral Given 10/24/23 1519)    Mobility walks with device     Focused Assessments     R Recommendations: See Admitting Provider Note  Report given to:   Additional Notes:

## 2023-10-24 NOTE — Progress Notes (Signed)
Phlebotomy to draw labs when available.

## 2023-10-24 NOTE — ED Notes (Signed)
 Husband at bedside.

## 2023-10-24 NOTE — H&P (Addendum)
 History and Physical    Patient: Carolyn Russo FMW:[id number] DOB: 04/23/1951 DOA: 10/24/2023 DOS: the patient was seen and examined on 10/24/2023 PCP: Karlene Mungo, MD  Patient coming from: Hemodialysis  Chief Complaint:  Chief Complaint  Patient presents with   Chest Pain   HPI: Carolyn Russo is a 72 y.o. female with medical history significant of ESRD on HD, CHF, CVA with residual left-sided weakness, right BKA, seizure disorder, diabetes mellitus type 2 who presents  with sudden onset of severe chest and back pain on the left side during her dialysis session this morning.  Most of history is obtained from the patient's husband who is present at bedside.  The pain was accompanied by severe nausea, but no vomiting or sweating was reported. The patient had been attending dialysis sessions regularly, three times a week, and had not missed any recent sessions.  Patient does report having some shortness of breath at current time.  Denies having any significant cough.  In the past few weeks, the patient has been experiencing increased agitation, difficulty sleeping, and elevated blood pressure. The patient's spouse reported that these symptoms began after the cessation of a beta blocker medication due to concerns about slow pulses and pauses in the heart rate.  The patient also reported a history of amputation, and has been experiencing increased difficulty with mobility and associated pain. The patient's spouse suggested that this could be due to a lack of exercise and poor posture.  The patient has a history of diabetes, which has been well-managed. The patient also reported to have undergone  recent mammogram, after which seem to onset her complaints of chest pain. A subsequent chest x-ray was negative.  The patient's spouse also reported concerns about possible dementia, as the patient has been repeating herself and seeming not to understand instructions. The patient requested a urinary  tract infection test, as she still produces urine.   Upon admission into the emergency department patient was noted to be afebrile with blood pressures elevated at 177/107, and O2 saturations currently maintained on room air.  Labs significant for hemoglobin 10, platelets 442, sodium 132, potassium 3.3, BUN 19, creatinine 4.01, and high-sensitivity troponin 66.  Chest x-ray normal unchanged from prior imaging from 02/12/23 mild pulmonary vascular congestion.  Patient had been given fentanyl  50 mcg IV and full dose aspirin .  CT scan of the chest abdomen and pelvis have been obtained and report pending. Review of Systems: As mentioned in the history of present illness. All other systems reviewed and are negative. Past Medical History:  Diagnosis Date   Acute GI bleeding    Allergy    Anemia    Anterior chest wall pain    Appendicitis 1965   Asthma    Body mass index 37.0-37.9, adult    Breast pain    Cataract    both eyes   CHF (congestive heart failure) (HCC)    Chronic kidney disease    stage 5 - on dialysis   Cognitive change 04/20/2021   r/t cva 03/2821   Complication of anesthesia    Memory loss after general   Dehydration 2014   Deviated septum 1971   Diabetes mellitus    Dysphagia due to old stroke    easy to get strangled when eating   Dyspnea 2014   Extrinsic asthma    WITH ASTHMA ATTACK   Fibroid 1980   GERD (gastroesophageal reflux disease)    Heart murmur    History of migraine  History of seizure    with stroke   Hx gestational diabetes    Hyperlipidemia    Hypertension 2014   Inguinal hernia 1959   Malaise and fatigue 2014   Non-IgE mediated allergic asthma 2014   Obesity    Pelvic pain    Pregnancy, high-risk 1985   Stroke (HCC) 04/20/2021   (CVA) of right basal ganglia   Tonsillitis 1968   Uterine fibroid 1980   Visual field defect    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   Past Surgical History:  Procedure Laterality Date   ABDOMINAL AORTOGRAM W/LOWER EXTREMITY  N/A 02/21/2022   Procedure: ABDOMINAL AORTOGRAM W/LOWER EXTREMITY;  Surgeon: Sheree Penne Bruckner, MD;  Location: [facility] INVASIVE CV LAB;  Service: Cardiovascular;  Laterality: N/A;   AMPUTATION Right 05/18/2022   Procedure: RIGHT BELOW KNEE AMPUTATION;  Surgeon: Harden Jerona GAILS, MD;  Location: [facility] OR;  Service: Orthopedics;  Laterality: Right;   AMPUTATION TOE Right 04/15/2022   Procedure: AMPUTATION  LST TOERIGHT FOOT;  Surgeon: Silva Juliene SAUNDERS, DPM;  Location: [facility] ORS;  Service: Podiatry;  Laterality: Right;   APPENDECTOMY  1959   BASCILIC VEIN TRANSPOSITION Right 04/30/2021   Procedure: RIGHT FIRST STAGE BASCILIC VEIN TRANSPOSITION;  Surgeon: Eliza Bruckner RAMAN, MD;  Location: [facility] OR;  Service: Vascular;  Laterality: Right;   CESAREAN SECTION  1985   COLONOSCOPY     ESOPHAGOGASTRODUODENOSCOPY (EGD) WITH PROPOFOL  N/A 04/18/2021   Procedure: ESOPHAGOGASTRODUODENOSCOPY (EGD) WITH PROPOFOL ;  Surgeon: Avram Lupita BRAVO, MD;  Location: [facility] ENDOSCOPY;  Service: Endoscopy;  Laterality: N/A;   EYE SURGERY     bilateral cataract    FLEXIBLE SIGMOIDOSCOPY N/A 04/18/2021   Procedure: FLEXIBLE SIGMOIDOSCOPY;  Surgeon: Avram Lupita BRAVO, MD;  Location: [facility] ENDOSCOPY;  Service: Endoscopy;  Laterality: N/A;   HERNIA REPAIR  1959   IR FLUORO GUIDE CV LINE RIGHT  03/18/2021   IR US  GUIDE VASC ACCESS RIGHT  03/18/2021   LEFT HEART CATH AND CORONARY ANGIOGRAPHY N/A 04/04/2017   Procedure: Left Heart Cath and Coronary Angiography;  Surgeon: Claudene Victory ORN, MD;  Location: [facility] INVASIVE CV LAB;  Service: Cardiovascular;  Laterality: N/A;   MYOMECTOMY  1980, 2004, 2007   PERIPHERAL VASCULAR THROMBECTOMY  02/21/2022   Procedure: PERIPHERAL VASCULAR THROMBECTOMY;  Surgeon: Sheree Penne Bruckner, MD;  Location: [facility] INVASIVE CV LAB;  Service: Cardiovascular;;  Right Tibial   RHINOPLASTY  1971   ROTATOR CUFF REPAIR  2003   SURGICAL REPAIR OF HEMORRHAGE  2015   TONSILLECTOMY  1968   Social History:  reports that she has never  smoked. She has never used smokeless tobacco. She reports that she does not drink alcohol and does not use drugs.  Allergies  Allergen Reactions   Almond Oil Anaphylaxis   Food Anaphylaxis    Peanuts - anaphylaxis Almonds - anaphylaxis Not listed on MAR   Wheat Other (See Comments)    Upset stomach   Not listed on MAR  Other Reaction(s): abdominal pain   Statins Itching and Other (See Comments)    Generalized aches- tolerates crestor   Not listed on MAR   Gadolinium Derivatives Other (See Comments)    Gadolinium-Containing Contrast Media Listed as an allergy on MAR Unknown reaction   Januvia  Fang.flavors ] Other (See Comments)    Listed as an allergy on MAR Unknown reaction   Pork-Derived Products Other (See Comments)    Does not eat pork  Listed on MAR   Shellfish Allergy Other (See Comments)    Mouth gets  raw Listed on MAR   Tetracycline Other (See Comments)    Raw mouth  Not listed on MAR  Other Reaction(s): Not available   Tetracycline Hcl     Other reaction(s): raw mouth  Other Reaction(s): raw mouth    Family History  Problem Relation Age of Onset   Psoriasis Mother    Melanoma Mother        abdominal   Alzheimer's disease Father    Diabetes Maternal Aunt    Colon cancer Cousin    Diabetes Cousin    Colon polyps Neg Hx    Esophageal cancer Neg Hx    Cancer - Colon Neg Hx    Liver disease Neg Hx    BRCA 1/2 Neg Hx    Breast cancer Neg Hx     Prior to Admission medications   Medication Sig Start Date End Date Taking? Authorizing Provider  acetaminophen  (TYLENOL ) 500 MG tablet Take 1,000 mg by mouth 2 (two) times daily as needed (leg and arm pain).    [provider]  albuterol  (PROVENTIL ) (2.5 MG/3ML) 0.083% nebulizer solution Use 1 vial (2.5 mg total) by nebulization every 4 (four) hours as needed for wheezing or shortness of breath. 02/14/23 02/14/24  Vann, Jessica U, DO  amitriptyline  (ELAVIL ) 25 MG tablet Take 0.5-1 tablets (12.5-25 mg  total) by mouth at bedtime. 09/11/23   Lovorn, Megan, MD  aspirin  EC 81 MG tablet Take 1 tablet (81 mg total) by mouth daily. Swallow whole. 06/10/22   Leotis Bogus, MD  cinacalcet  (SENSIPAR ) 30 MG tablet Take 30 mg by mouth daily.    [provider]  diclofenac  Sodium (VOLTAREN ) 1 % GEL Apply 2 g topically 4 (four) times daily. To left wrist 05/26/21   Love, Sharlet RAMAN, PA-C  gabapentin  (NEURONTIN ) 300 MG capsule Take 1 capsule (300 mg total) by mouth at bedtime. 09/11/23   Lovorn, Megan, MD  Glucagon, rDNA, (GLUCAGON EMERGENCY) 1 MG KIT Inject 1 mg into the vein as needed (CBG of 65mg /dL).    [provider]  hydrALAZINE  (APRESOLINE ) 25 MG tablet Take 1 tablet (25 mg total) by mouth in the morning and at bedtime. 01/10/23   Raford Riggs, MD  hydrocortisone  (ANUSOL -HC) 2.5 % rectal cream Place rectally 4 (four) times daily. 02/14/23   Vann, Jessica U, DO  insulin  aspart (NOVOLOG ) 100 UNIT/ML injection Inject 0-6 Units into the skin 3 (three) times daily with meals. CBG < 70: Implement Hypoglycemia Standing Orders and refer to Hypoglycemia Standing Orders sidebar report CBG 70 - 120: 0 units CBG 121 - 150: 0 units CBG 151 - 200: 1 unit CBG 201-250: 2 units CBG 251-300: 3 units CBG 301-350: 4 units CBG 351-400: 5 units CBG > 400: Give 6 units and call MD 06/29/22   Pokhrel, Laxman, MD  insulin  glargine (LANTUS ) 100 UNIT/ML Solostar Pen Inject 5 Units into the skin at bedtime. 06/29/22   Pokhrel, Laxman, MD  levETIRAcetam  (KEPPRA ) 250 MG tablet Take 1.5 tablets (375 mg total) by mouth every Monday, Wednesday, and Friday at 6 PM. 06/29/22   Pokhrel, Vernal, MD  levETIRAcetam  (KEPPRA ) 750 MG tablet Take 1 tablet (750 mg total) by mouth at bedtime. 06/29/22   Pokhrel, Laxman, MD  loratadine  (CLARITIN ) 10 MG tablet Take 10 mg by mouth daily.    [provider]  losartan  (COZAAR ) 50 MG tablet Take 50 mg by mouth daily.    [provider]  methylphenidate  (RITALIN ) 5 MG  tablet Take 1 tablet (5  mg total) by mouth 2 (two) times daily with breakfast and lunch. 09/11/23   Lovorn, Duwaine, MD  mometasone -formoterol  (DULERA ) 200-5 MCG/ACT AERO Inhale 2 puffs into the lungs 2 (two) times daily. 04/15/22   Jarold Medici, MD  pantoprazole  (PROTONIX ) 40 MG tablet Take 1 tablet (40 mg total) by mouth daily. Patient taking differently: Take 40 mg by mouth at bedtime. 02/07/22   Jarold Medici, MD  prochlorperazine  (COMPAZINE ) 10 MG tablet Take 1 tablet (10 mg total) by mouth every 8 (eight) hours as needed for nausea or vomiting. 02/15/23   Legrand Victory LITTIE DOUGLAS, MD  rosuvastatin  (CRESTOR ) 20 MG tablet Take 1 tablet (20 mg total) by mouth daily. Patient taking differently: Take 20 mg by mouth at bedtime. 08/18/21   Ghumman, Ramandeep, NP  sevelamer  carbonate (RENVELA ) 0.8 g PACK packet Take 0.8 g by mouth 3 (three) times daily. 10/19/21   [provider]  ticagrelor  (BRILINTA ) 90 MG TABS tablet Take 1 tablet (90 mg total) by mouth 2 (two) times daily. 06/10/22   Leotis Bogus, MD  traZODone  (DESYREL ) 100 MG tablet Take 1 tablet (100 mg total) by mouth at bedtime as needed for sleep (sleep). 08/07/23   Cornelio Duwaine, MD    Physical Exam: Vitals:   10/24/23 1059 10/24/23 1230 10/24/23 1345 10/24/23 1445  BP: (!) 177/107 (!) 168/76 116/69   Pulse: 92 88 83   Resp: 18 17 17    Temp: 97.7 F (36.5 C)   97.9 F (36.6 C)  TempSrc: Oral   Oral  SpO2: 94% 97% 98%   Weight: 79.4 kg     Height: 5' 3 (1.6 m)        Constitutional: Elderly female who appears to be unable to get comfortable Eyes: PERRL, lids and conjunctivae normal ENMT: Mucous membranes are moist.  Normal dentition.  Neck: normal, supple,   Respiratory: clear to auscultation bilaterally, no wheezing, no crackles. Normal respiratory effort. No accessory muscle use.  Cardiovascular: Regular rate and rhythm, no murmurs / rubs / gallops. No extremity edema.   Abdomen: no tenderness, no masses palpated.   Bowel sounds positive.  Musculoskeletal: no clubbing / cyanosis. No joint deformity upper and lower extremities. Good ROM, no contractures. Normal muscle tone.  Skin: no rashes, lesions, ulcers. No induration Neurologic: CN 2-12 grossly intact. Sensation intact, DTR normal. Strength 5/5 in all 4.  Psychiatric: Normal judgment and insight. Alert and oriented x 3. Normal mood.   Data Reviewed:  Reviewed labs, imaging, and pertinent records as documented  Assessment and Plan:  Chest pain Elevated troponin Patient presents myself left-sided chest pain with reports of radiation to her back.  High-sensitivity troponins noted to initially be elevated at 66.  Patient's husband notes that chest pain complaints started after she had a recent mammogram.  On EKG appears similar to prior. Chest x-ray noted mild pulmonary vascular congestion similar to prior x-ray from April.  CT angiogram of the chest abdomen pelvis did not note any signs for aortic aneurysm or dissection.  Cardiology had been called, but recommended formal consult in a.m. if needed.  Suspect acute elevation in troponin secondary to demand.  Patient's symptoms most likely cardiac in nature. -Admit to a cardiac telemetry bed -Follow-up repeat troponin -Check echocardiogram -Follow-up telemetry overnight  ESRD on HD Patient had gone to hemodialysis this morning, but only had a partial session.  Labs noted sodium 132, potassium 3.3, BUN 19, and creatinine 4.01. -Check BMP -Continue current medication regimen -Nephrology consulted for need  of dialysis  Hypertensive urgency On admission blood pressures were noted to be elevated up to 177/107.  Patient's husband notes that blood pressures have been elevated ever since Coreg  was discontinued due to bradycardia. -Continue losartan  50 mg daily -Increased hydralazine  from 25 mg twice daily to 50 mg twice daily -Continue to monitor and address regimen as needed  Hypokalemia Acute.   Potassium 3.3. -Recheck potassium levels in a.m.  Controlled diabetes mellitus type 2, with long-term use of insulin  On admission glucose noted to be 126.  At home patient on a very sensitive SSI. -Hypoglycemic protocols -CBGs before every meal with very sensitive SSI -Adjust regimen as needed.  History of CVA with residual deficit Following prior stroke patient is residual left-sided deficits.  Memory loss Patient has been noticed that over the last couple of weeks her memory has progressively declined.  She has been repeating things and often getting agitated at night.  He has concerns for dementia, but she has not been formally tested. -Delirium precautions -May warrant Seroquel  overnight if noted to have significant agitation -Recommend outpatient referral to neurology  Insomnia Patient's husband notes that she previously had been on trazodone  for issues with insomnia at night, but was recently switched to amitriptyline . -Continue amitriptyline .  Consider increasing dose -Melatonin added nightly  Peripheral vascular disease Status post right BKA  History of seizures -Continue Keppra   Dyslipidemia -Continue Crestor   GERD -Continue Protonix   Obesity BMI 31.24 kg/m   Advance Care Planning:   Code Status: Full Code    Consults: Nephrology and cardiology  Family Communication: Husband updated at bedside  Severity of Illness: The appropriate patient status for this patient is OBSERVATION. Observation status is judged to be reasonable and necessary in order to provide the required intensity of service to ensure the patient's safety. The patient's presenting symptoms, physical exam findings, and initial radiographic and laboratory data in the context of their medical condition is felt to place them at decreased risk for further clinical deterioration. Furthermore, it is anticipated that the patient will be medically stable for discharge from the hospital within 2 midnights  of admission.   Author: Maximino DELENA Sharps, MD 10/24/2023 3:29 PM  For on call review [email or web address].

## 2023-10-24 NOTE — ED Notes (Signed)
Awaiting patient from lobby 

## 2023-10-24 NOTE — ED Notes (Signed)
 Patient transported to CT

## 2023-10-24 NOTE — ED Notes (Signed)
Called pt's husband and let him know she is here, per her request.

## 2023-10-24 NOTE — ED Notes (Signed)
Patient 2nd trop due @ 1430 per RN.  RN will attempt to draw from IV access, if unsuccessful, will notify phlebotomy to draw.

## 2023-10-25 ENCOUNTER — Encounter (HOSPITAL_COMMUNITY): Payer: Self-pay | Admitting: Internal Medicine

## 2023-10-25 ENCOUNTER — Observation Stay (HOSPITAL_BASED_OUTPATIENT_CLINIC_OR_DEPARTMENT_OTHER): Payer: Medicare PPO

## 2023-10-25 DIAGNOSIS — R079 Chest pain, unspecified: Secondary | ICD-10-CM

## 2023-10-25 LAB — ECHOCARDIOGRAM COMPLETE
AR max vel: 1.6 cm2
AV Area VTI: 1.69 cm2
AV Area mean vel: 1.69 cm2
AV Mean grad: 6.8 mm[Hg]
AV Peak grad: 14.1 mm[Hg]
Ao pk vel: 1.88 m/s
Area-P 1/2: 4.46 cm2
Height: 63 in
S' Lateral: 2.9 cm
Weight: 2821.89 [oz_av]

## 2023-10-25 LAB — CBC
HCT: 28.4 % — ABNORMAL LOW (ref 36.0–46.0)
Hemoglobin: 9.8 g/dL — ABNORMAL LOW (ref 12.0–15.0)
MCH: 28.6 pg (ref 26.0–34.0)
MCHC: 34.5 g/dL (ref 30.0–36.0)
MCV: 82.8 fL (ref 80.0–100.0)
Platelets: 400 10*3/uL (ref 150–400)
RBC: 3.43 MIL/uL — ABNORMAL LOW (ref 3.87–5.11)
RDW: 18.7 % — ABNORMAL HIGH (ref 11.5–15.5)
WBC: 7.6 10*3/uL (ref 4.0–10.5)
nRBC: 0 % (ref 0.0–0.2)

## 2023-10-25 LAB — RENAL FUNCTION PANEL
Albumin: 2.9 g/dL — ABNORMAL LOW (ref 3.5–5.0)
Anion gap: 13 (ref 5–15)
BUN: 24 mg/dL — ABNORMAL HIGH (ref 8–23)
CO2: 25 mmol/L (ref 22–32)
Calcium: 8.5 mg/dL — ABNORMAL LOW (ref 8.9–10.3)
Chloride: 94 mmol/L — ABNORMAL LOW (ref 98–111)
Creatinine, Ser: 5.01 mg/dL — ABNORMAL HIGH (ref 0.44–1.00)
GFR, Estimated: 9 mL/min — ABNORMAL LOW (ref >60)
Glucose, Bld: 88 mg/dL (ref 70–99)
Phosphorus: 3.9 mg/dL (ref 2.5–4.6)
Potassium: 3.9 mmol/L (ref 3.5–5.1)
Sodium: 132 mmol/L — ABNORMAL LOW (ref 135–145)

## 2023-10-25 LAB — HEPATITIS B SURFACE ANTIGEN: Hepatitis B Surface Ag: NONREACTIVE

## 2023-10-25 LAB — GLUCOSE, CAPILLARY
Glucose-Capillary: 91 mg/dL (ref 70–99)
Glucose-Capillary: 99 mg/dL (ref 70–99)

## 2023-10-25 MED ORDER — CHLORHEXIDINE GLUCONATE CLOTH 2 % EX PADS: 6.0000 | MEDICATED_PAD | Freq: Every day | CUTANEOUS | Status: DC

## 2023-10-25 MED ORDER — HEPARIN SODIUM (PORCINE) 1000 UNIT/ML IJ SOLN
3800.0000 [IU] | Freq: Once | INTRAMUSCULAR | Status: DC
  Filled 2023-10-25: qty 4

## 2023-10-25 MED ORDER — HEPARIN SODIUM (PORCINE) 1000 UNIT/ML IJ SOLN
3200.0000 [IU] | Freq: Once | INTRAMUSCULAR | Status: AC
  Administered 2023-10-25: 3200 [IU]

## 2023-10-25 MED ORDER — PERFLUTREN LIPID MICROSPHERE
1.0000 mL | INTRAVENOUS | Status: AC | PRN
  Administered 2023-10-25: 4 mL via INTRAVENOUS
  Filled 2023-10-25: qty 10

## 2023-10-25 NOTE — Progress Notes (Signed)
 Echocardiogram 2D Echocardiogram has been performed.  Warren Lacy Sheretha Shadd RDCS 10/25/2023, 1:49 PM

## 2023-10-25 NOTE — Plan of Care (Signed)
   Problem: Education: Goal: Knowledge of General Education information will improve Description: Including pain rating scale, medication(s)/side effects and non-pharmacologic comfort measures Outcome: Progressing   Problem: Health Behavior/Discharge Planning: Goal: Ability to manage health-related needs will improve Outcome: Progressing   Problem: Clinical Measurements: Goal: Ability to maintain clinical measurements within normal limits will improve Outcome: Progressing Goal: Will remain free from infection Outcome: Progressing Goal: Diagnostic test results will improve Outcome: Progressing Goal: Respiratory complications will improve Outcome: Progressing Goal: Cardiovascular complication will be avoided Outcome: Progressing   Problem: Activity: Goal: Risk for activity intolerance will decrease Outcome: Progressing   Problem: Nutrition: Goal: Adequate nutrition will be maintained Outcome: Progressing   Problem: Coping: Goal: Level of anxiety will decrease Outcome: Progressing   Problem: Elimination: Goal: Will not experience complications related to bowel motility Outcome: Progressing Goal: Will not experience complications related to urinary retention Outcome: Progressing   Problem: Pain Management: Goal: General experience of comfort will improve Outcome: Progressing   Problem: Safety: Goal: Ability to remain free from injury will improve Outcome: Progressing   Problem: Skin Integrity: Goal: Risk for impaired skin integrity will decrease Outcome: Progressing   Problem: Education: Goal: Understanding of cardiac disease, CV risk reduction, and recovery process will improve Outcome: Progressing Goal: Individualized Educational Video(s) Outcome: Progressing   Problem: Activity: Goal: Ability to tolerate increased activity will improve Outcome: Progressing   Problem: Cardiac: Goal: Ability to achieve and maintain adequate cardiovascular perfusion will  improve Outcome: Progressing   Problem: Health Behavior/Discharge Planning: Goal: Ability to safely manage health-related needs after discharge will improve Outcome: Progressing

## 2023-10-25 NOTE — Progress Notes (Signed)
   10/25/23 1655  Vitals  Temp 97.7 F (36.5 C)  Pulse Rate 78  Resp 16  BP (!) 154/73  SpO2 97 %  Post Treatment  Dialyzer Clearance Lightly streaked  Liters Processed 48  Fluid Removed (mL) 2000 mL  Tolerated HD Treatment Yes   Received patient in bed to unit.  Alert and oriented.  Informed consent signed and in chart.   TX duration: 2.0 hours  Patient tolerated well.  Transported back to the room  Alert, without acute distress.  Hand-off given to patient's nurse.   Access used: CVC Access issues: nonr  Total UF removed: Medication(s) given: See mar    Geni Seip, RN [facility] D[facility]

## 2023-10-25 NOTE — Care Management Obs Status (Signed)
 MEDICARE OBSERVATION STATUS NOTIFICATION   Patient Details  Name: Carolyn Russo MRN: [id number] Date of Birth: 07-Oct-1951   Medicare Observation Status Notification Given:  Yes    Harriet Masson, RN 10/25/2023, 11:32 AM

## 2023-10-25 NOTE — Progress Notes (Addendum)
 Increasing confusion overnight. Husband explained it is common for her and discussed with physicians to consider additional meds from home meds to help her rest. Initially was sleeping but waking up off and on since midnight. Elevated BP 202/121. Patient complaining of right shoulder pain radiating to elbow and chest 9 out of 10. Some relief with repositioning, therapeutic massage, and keeping patient company. PRN tylenol  and hydralizine given. 03:53 recheck of 162/64.      10/25/23 0245  Assess: MEWS Score  BP (!) (S)  202/121 (BP rechecked multiple times; Patient in pain)  MAP (mmHg) 130  Pulse Rate (!) 101  ECG Heart Rate (!) 107  Resp 20  SpO2 97 %  O2 Device Room Air  Assess: MEWS Score  MEWS Temp 0  MEWS Systolic 2  MEWS Pulse 1  MEWS RR 0  MEWS LOC 0  MEWS Score 3  MEWS Score Color Yellow  Assess: if the MEWS score is Yellow or Red  Were vital signs accurate and taken at a resting state? Yes  Does the patient meet 2 or more of the SIRS criteria? No  MEWS guidelines implemented  Yes, yellow  Treat  MEWS Interventions Considered administering scheduled or prn medications/treatments as ordered  Take Vital Signs  Increase Vital Sign Frequency  Yellow: Q2hr x1, continue Q4hrs until patient remains green for 12hrs  Escalate  MEWS: Escalate Yellow: Discuss with charge nurse and consider notifying provider and/or RRT  Notify: Charge Nurse/RN  Name of Charge Nurse/RN Notified Tawni Hurst  Assess: SIRS CRITERIA  SIRS Temperature  0  SIRS Respirations  0  SIRS Pulse 1  SIRS WBC 0  SIRS Score Sum  1

## 2023-10-25 NOTE — Consult Note (Signed)
 [facility] Renal Consultation Note    Indication for Consultation:  Management of ESRD/hemodialysis; anemia, hypertension/volume and secondary hyperparathyroidism  HPI: Carolyn Russo is a 73 y.o. female with a PMH significant for CHF, DM type 2, PAD s/p RBKA, asthma, GERD, h/o CVA's with left sided weakness and seizure disorder, HLD, and ESRD on HD MWF at [facility] who presented to [facility] ED via EMS from dialysis with worsening left-sided chest pain radiating to her shoulder and back.  She has been having this pain for the past 2 weeks but worsened with HD.  She completed 2 hours of her 4 hour prescription.  In the ED, Bp 177/107, temp 97.7, HR 92, SpO2 94% on room air.  Labs notable for Na 132, K 3.3, gluc 126, Ca 8.2, alb 3.1, BNP 1014.6, troponin I 66, Hgb 10, plt 442.  CXR with mild pulmonary vascular congestion.  CT angio negative for dissection or aneurysm and no acute intrathoracic, intra-abdominal, or intrapelvic abnormality.  She was admitted for further evaluation of chest pain.  We were consulted to provide dialysis during her stay.  She does have a history of noncompliance with her dialysis and often signs off early.  Past Medical History:  Diagnosis Date   Acute GI bleeding    Allergy    Anemia    Anterior chest wall pain    Appendicitis 1965   Asthma    Body mass index 37.0-37.9, adult    Breast pain    Cataract    both eyes   CHF (congestive heart failure) (HCC)    Chronic kidney disease    stage 5 - on dialysis   Cognitive change 04/20/2021   r/t cva 03/2821   Complication of anesthesia    Memory loss after general   Dehydration 2014   Deviated septum 1971   Diabetes mellitus    Dysphagia due to old stroke    easy to get strangled when eating   Dyspnea 2014   Extrinsic asthma    WITH ASTHMA ATTACK   Fibroid 1980   GERD (gastroesophageal reflux disease)    Heart murmur    History of migraine    History of seizure    with stroke   Hx gestational diabetes     Hyperlipidemia    Hypertension 2014   Inguinal hernia 1959   Malaise and fatigue 2014   Non-IgE mediated allergic asthma 2014   Obesity    Pelvic pain    Pregnancy, high-risk 1985   Stroke (HCC) 04/20/2021   (CVA) of right basal ganglia   Tonsillitis 1968   Uterine fibroid 1980   Visual field defect    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   Past Surgical History:  Procedure Laterality Date   ABDOMINAL AORTOGRAM W/LOWER EXTREMITY N/A 02/21/2022   Procedure: ABDOMINAL AORTOGRAM W/LOWER EXTREMITY;  Surgeon: Sheree Penne Bruckner, MD;  Location: [facility] INVASIVE CV LAB;  Service: Cardiovascular;  Laterality: N/A;   AMPUTATION Right 05/18/2022   Procedure: RIGHT BELOW KNEE AMPUTATION;  Surgeon: Harden Jerona GAILS, MD;  Location: [facility] OR;  Service: Orthopedics;  Laterality: Right;   AMPUTATION TOE Right 04/15/2022   Procedure: AMPUTATION  LST TOERIGHT FOOT;  Surgeon: Silva Juliene SAUNDERS, DPM;  Location: [facility] ORS;  Service: Podiatry;  Laterality: Right;   APPENDECTOMY  1959   BASCILIC VEIN TRANSPOSITION Right 04/30/2021   Procedure: RIGHT FIRST STAGE BASCILIC VEIN TRANSPOSITION;  Surgeon: Eliza Bruckner RAMAN, MD;  Location: [facility] OR;  Service: Vascular;  Laterality: Right;  CESAREAN SECTION  1985   COLONOSCOPY     ESOPHAGOGASTRODUODENOSCOPY (EGD) WITH PROPOFOL  N/A 04/18/2021   Procedure: ESOPHAGOGASTRODUODENOSCOPY (EGD) WITH PROPOFOL ;  Surgeon: Avram Lupita BRAVO, MD;  Location: [facility] ENDOSCOPY;  Service: Endoscopy;  Laterality: N/A;   EYE SURGERY     bilateral cataract    FLEXIBLE SIGMOIDOSCOPY N/A 04/18/2021   Procedure: FLEXIBLE SIGMOIDOSCOPY;  Surgeon: Avram Lupita BRAVO, MD;  Location: [facility] ENDOSCOPY;  Service: Endoscopy;  Laterality: N/A;   HERNIA REPAIR  1959   IR FLUORO GUIDE CV LINE RIGHT  03/18/2021   IR US  GUIDE VASC ACCESS RIGHT  03/18/2021   LEFT HEART CATH AND CORONARY ANGIOGRAPHY N/A 04/04/2017   Procedure: Left Heart Cath and Coronary Angiography;  Surgeon: Claudene Victory ORN, MD;  Location: [facility] INVASIVE CV LAB;   Service: Cardiovascular;  Laterality: N/A;   MYOMECTOMY  1980, 2004, 2007   PERIPHERAL VASCULAR THROMBECTOMY  02/21/2022   Procedure: PERIPHERAL VASCULAR THROMBECTOMY;  Surgeon: Sheree Penne Bruckner, MD;  Location: [facility] INVASIVE CV LAB;  Service: Cardiovascular;;  Right Tibial   RHINOPLASTY  1971   ROTATOR CUFF REPAIR  2003   SURGICAL REPAIR OF HEMORRHAGE  2015   TONSILLECTOMY  1968   Family History:   Family History  Problem Relation Age of Onset   Psoriasis Mother    Melanoma Mother        abdominal   Alzheimer's disease Father    Diabetes Maternal Aunt    Colon cancer Cousin    Diabetes Cousin    Colon polyps Neg Hx    Esophageal cancer Neg Hx    Cancer - Colon Neg Hx    Liver disease Neg Hx    BRCA 1/2 Neg Hx    Breast cancer Neg Hx    Social History:  reports that she has never smoked. She has never used smokeless tobacco. She reports that she does not drink alcohol and does not use drugs. Allergies  Allergen Reactions   Almond Oil Anaphylaxis   Food Anaphylaxis    Peanuts - anaphylaxis Almonds - anaphylaxis Not listed on MAR   Wheat Other (See Comments)    Upset stomach   Not listed on MAR  Other Reaction(s): abdominal pain   Statins Itching and Other (See Comments)    Generalized aches- tolerates crestor   Not listed on MAR   Gadolinium Derivatives Other (See Comments)    Gadolinium-Containing Contrast Media Listed as an allergy on MAR Unknown reaction   Januvia  [Sitagliptin ] Other (See Comments)    Listed as an allergy on MAR Unknown reaction   Pork-Derived Products Other (See Comments)    Does not eat pork  Listed on MAR   Shellfish Allergy Other (See Comments)    Mouth gets raw Listed on MAR   Tetracycline Other (See Comments)    Raw mouth  Not listed on MAR  Other Reaction(s): Not available   Tetracycline Hcl     Other reaction(s): raw mouth  Other Reaction(s): raw mouth   Prior to Admission medications   Medication Sig Start Date  End Date Taking? Authorizing Provider  acetaminophen  (TYLENOL ) 500 MG tablet Take 1,000 mg by mouth 2 (two) times daily as needed (leg and arm pain).   Yes [provider]  albuterol  (PROVENTIL ) (2.5 MG/3ML) 0.083% nebulizer solution Use 1 vial (2.5 mg total) by nebulization every 4 (four) hours as needed for wheezing or shortness of breath. 02/14/23 02/14/24 Yes Vann, Jessica U, DO  amitriptyline  (ELAVIL ) 25 MG tablet Take 0.5-1 tablets (  12.5-25 mg total) by mouth at bedtime. 09/11/23  Yes Lovorn, Megan, MD  aspirin  EC 81 MG tablet Take 1 tablet (81 mg total) by mouth daily. Swallow whole. 06/10/22  Yes Leotis Bogus, MD  calcitRIOL  (ROCALTROL ) 0.5 MCG capsule Take 0.5 mcg by mouth See admin instructions. Only takes on Dialysis days 07/10/23  Yes [provider]  cinacalcet  (SENSIPAR ) 30 MG tablet Take 30 mg by mouth daily.   Yes [provider]  diclofenac  Sodium (VOLTAREN ) 1 % GEL Apply 2 g topically 4 (four) times daily. To left wrist 05/26/21  Yes Love, Sharlet RAMAN, PA-C  gabapentin  (NEURONTIN ) 300 MG capsule Take 1 capsule (300 mg total) by mouth at bedtime. 09/11/23  Yes Lovorn, Megan, MD  hydrALAZINE  (APRESOLINE ) 25 MG tablet Take 1 tablet (25 mg total) by mouth in the morning and at bedtime. 01/10/23  Yes Raford Riggs, MD  hydrocortisone  (ANUSOL -HC) 2.5 % rectal cream Place rectally 4 (four) times daily. 02/14/23  Yes Vann, Jessica U, DO  insulin  aspart (NOVOLOG ) 100 UNIT/ML injection Inject 0-6 Units into the skin 3 (three) times daily with meals. CBG < 70: Implement Hypoglycemia Standing Orders and refer to Hypoglycemia Standing Orders sidebar report CBG 70 - 120: 0 units CBG 121 - 150: 0 units CBG 151 - 200: 1 unit CBG 201-250: 2 units CBG 251-300: 3 units CBG 301-350: 4 units CBG 351-400: 5 units CBG > 400: Give 6 units and call MD 06/29/22  Yes Pokhrel, Laxman, MD  Iron  Sucrose (VENOFER  IV) Inject 50 mg into the skin once a week. 08/16/23 10/18/24 Yes [provider]  levETIRAcetam  (KEPPRA ) 250 MG tablet Take 1.5 tablets (375 mg total) by mouth every Monday, Wednesday, and Friday at 6 PM. 06/29/22  Yes Pokhrel, Laxman, MD  levETIRAcetam  (KEPPRA ) 750 MG tablet Take 1 tablet (750 mg total) by mouth at bedtime. 06/29/22  Yes Pokhrel, Laxman, MD  loratadine  (CLARITIN ) 10 MG tablet Take 10 mg by mouth daily.   Yes [provider]  losartan  (COZAAR ) 50 MG tablet Take 50 mg by mouth daily.   Yes [provider]  Methoxy PEG-Epoetin  Beta (MIRCERA IJ) Inject 1 Dose into the skin every 14 (fourteen) days. 04/19/23 08/28/24 Yes [provider]  methylphenidate  (RITALIN ) 5 MG tablet Take 1 tablet (5 mg total) by mouth 2 (two) times daily with breakfast and lunch. 09/11/23  Yes Lovorn, Duwaine, MD  mometasone -formoterol  (DULERA ) 200-5 MCG/ACT AERO Inhale 2 puffs into the lungs 2 (two) times daily. 04/15/22  Yes Jarold Medici, MD  pantoprazole  (PROTONIX ) 40 MG tablet Take 1 tablet (40 mg total) by mouth daily. Patient taking differently: Take 40 mg by mouth at bedtime. 02/07/22  Yes Jarold Medici, MD  prochlorperazine  (COMPAZINE ) 10 MG tablet Take 1 tablet (10 mg total) by mouth every 8 (eight) hours as needed for nausea or vomiting. 02/15/23  Yes Danis, Victory LITTIE MOULD, MD  rosuvastatin  (CRESTOR ) 20 MG tablet Take 1 tablet (20 mg total) by mouth daily. Patient taking differently: Take 20 mg by mouth at bedtime. 08/18/21  Yes Ghumman, Ramandeep, NP  sevelamer  carbonate (RENVELA ) 0.8 g PACK packet Take 0.8 g by mouth 3 (three) times daily. 10/19/21  Yes [provider]  ticagrelor  (BRILINTA ) 90 MG TABS tablet Take 1 tablet (90 mg total) by mouth 2 (two) times daily. 06/10/22  Yes Leotis Bogus, MD  Glucagon, rDNA, (GLUCAGON EMERGENCY) 1 MG KIT Inject 1 mg into the vein as needed (CBG of 65mg /dL).    [provider]   Current Facility-Administered Medications  Medication Dose Route Frequency Provider Last Rate Last Admin    acetaminophen  (TYLENOL ) tablet 650 mg  650 mg Oral Q6H PRN Smith, Rondell A, MD   650 mg at 10/25/23 9747   Or   acetaminophen  (TYLENOL ) suppository 650 mg  650 mg Rectal Q6H PRN Smith, Rondell A, MD       albuterol  (PROVENTIL ) (2.5 MG/3ML) 0.083% nebulizer solution 2.5 mg  2.5 mg Nebulization Q6H PRN Smith, Rondell A, MD       amitriptyline  (ELAVIL ) tablet 50 mg  50 mg Oral QHS Smith, Rondell A, MD   50 mg at 10/24/23 2103   aspirin  EC tablet 81 mg  81 mg Oral Daily Smith, Rondell A, MD   81 mg at 10/25/23 1147   calcitRIOL  (ROCALTROL ) capsule 0.5 mcg  0.5 mcg Oral Q M,W,F Smith, Rondell A, MD   0.5 mcg at 10/25/23 1147   Chlorhexidine  Gluconate Cloth 2 % PADS 6 each  6 each Topical Daily Smith, Rondell A, MD   6 each at 10/25/23 1200   cinacalcet  (SENSIPAR ) tablet 30 mg  30 mg Oral Q breakfast Claudene Reeves A, MD   30 mg at 10/25/23 1147   diclofenac  Sodium (VOLTAREN ) 1 % topical gel 2 g  2 g Topical QID Smith, Rondell A, MD   2 g at 10/24/23 2116   gabapentin  (NEURONTIN ) capsule 300 mg  300 mg Oral QHS Smith, Rondell A, MD   300 mg at 10/24/23 2103   heparin  injection 5,000 Units  5,000 Units Subcutaneous Q8H Smith, Rondell A, MD   5,000 Units at 10/25/23 9363   hydrALAZINE  (APRESOLINE ) injection 10 mg  10 mg Intravenous Q4H PRN Smith, Rondell A, MD   10 mg at 10/25/23 9687   hydrALAZINE  (APRESOLINE ) tablet 50 mg  50 mg Oral BID Smith, Rondell A, MD   50 mg at 10/25/23 1147   levETIRAcetam  (KEPPRA ) tablet 375 mg  375 mg Oral Q M,W,F-1800 Smith, Rondell A, MD       levETIRAcetam  (KEPPRA ) tablet 750 mg  750 mg Oral QHS Smith, Rondell A, MD   750 mg at 10/24/23 2115   loratadine  (CLARITIN ) tablet 10 mg  10 mg Oral Daily Smith, Rondell A, MD   10 mg at 10/25/23 1149   losartan  (COZAAR ) tablet 50 mg  50 mg Oral Daily Claudene, Rondell A, MD   50 mg at 10/25/23 1147   melatonin tablet 5 mg  5 mg Oral QHS Smith, Rondell A, MD   5 mg at 10/24/23 2103   mometasone -formoterol  (DULERA ) 200-5 MCG/ACT inhaler  2 puff  2 puff Inhalation BID Claudene Reeves A, MD       ondansetron  (ZOFRAN ) tablet 4 mg  4 mg Oral Q6H PRN Claudene Reeves A, MD       Or   ondansetron  (ZOFRAN ) injection 4 mg  4 mg Intravenous Q6H PRN Claudene Reeves A, MD       pantoprazole  (PROTONIX ) EC tablet 40 mg  40 mg Oral QHS Smith, Rondell A, MD       rosuvastatin  (CRESTOR ) tablet 20 mg  20 mg Oral QHS Smith, Rondell A, MD   20 mg at 10/24/23 2103   sevelamer  carbonate (RENVELA ) packet 0.8 g  0.8 g Oral TID with meals Claudene, Rondell A, MD   0.8 g at 10/25/23 1148   sodium chloride  flush (NS) 0.9 % injection 3 mL  3 mL Intravenous Q12H Claudene Reeves LABOR, MD  3 mL at 10/24/23 2116   ticagrelor  (BRILINTA ) tablet 90 mg  90 mg Oral BID Claudene Reeves A, MD   90 mg at 10/25/23 1147   Labs: Basic Metabolic Panel: Recent Labs  Lab 10/24/23 1125 10/25/23 0521  NA 132* 132*  K 3.3* 3.9  CL 94* 94*  CO2 26 25  GLUCOSE 126* 88  BUN 19 24*  CREATININE 4.01* 5.01*  CALCIUM  8.2* 8.5*  PHOS  --  3.9   Liver Function Tests: Recent Labs  Lab 10/24/23 1125 10/25/23 0521  AST 18  --   ALT 14  --   ALKPHOS 123  --   BILITOT 0.4  --   PROT 7.5  --   ALBUMIN  3.1* 2.9*   No results for input(s): LIPASE, AMYLASE in the last 168 hours. No results for input(s): AMMONIA in the last 168 hours. CBC: Recent Labs  Lab 10/24/23 1220 10/25/23 0521  WBC 7.9 7.6  HGB 10.0* 9.8*  HCT 28.9* 28.4*  MCV 83.0 82.8  PLT 442* 400   Cardiac Enzymes: Recent Labs  Lab 10/24/23 1743  CKTOTAL 100   CBG: Recent Labs  Lab 10/24/23 1742 10/24/23 2106 10/25/23 0848 10/25/23 1220  GLUCAP 111* 122* 91 99   Iron  Studies: No results for input(s): IRON , TIBC, TRANSFERRIN, FERRITIN in the last 72 hours. Studies/Results: CT Angio Chest/Abd/Pel for Dissection W and/or Wo Contrast Result Date: 10/24/2023 CLINICAL DATA:  Two-week history of left chest and shoulder pain radiating into the back. Hypertension. EXAM: CT ANGIOGRAPHY CHEST,  ABDOMEN AND PELVIS TECHNIQUE: Non-contrast CT of the chest was initially obtained. Multidetector CT imaging through the chest, abdomen and pelvis was performed using the standard protocol during bolus administration of intravenous contrast. Multiplanar reconstructed images and MIPs were obtained and reviewed to evaluate the vascular anatomy. RADIATION DOSE REDUCTION: This exam was performed according to the departmental dose-optimization program which includes automated exposure control, adjustment of the mA and/or kV according to patient size and/or use of iterative reconstruction technique. CONTRAST:  80mL OMNIPAQUE  IOHEXOL  350 MG/ML SOLN COMPARISON:  Same day chest radiograph, CT chest, abdomen, and pelvis dated 05/22/2022 FINDINGS: CTA CHEST FINDINGS Cardiovascular: Right IJ central venous catheter tip terminates in the right atrium. Preferential opacification of the thoracic aorta. No evidence of thoracic aortic aneurysm or dissection. Mild multichamber cardiomegaly. No pericardial effusion. Coronary artery calcifications. Mediastinum/Nodes: Imaged thyroid  gland without nodules meeting criteria for imaging follow-up by size. Patulous esophagus. Right hilar lymph node measures 11 mm. Lungs/Pleura: The central airways are patent. No focal consolidation. Subsegmental lingular and left lower lobe atelectasis. No pneumothorax. No pleural effusion. Musculoskeletal: No acute or abnormal lytic or blastic osseous lesions. Multilevel degenerative changes of the thoracic spine. Review of the MIP images confirms the above findings. CTA ABDOMEN AND PELVIS FINDINGS VASCULAR Aorta: Aortic atherosclerosis. Normal caliber aorta without aneurysm, dissection, vasculitis or significant stenosis. Celiac: Patent without evidence of aneurysm, dissection, vasculitis or significant stenosis. SMA: Patent without evidence of aneurysm, dissection, vasculitis or significant stenosis. Renals: Moderate narrowing of the right renal artery  origin due to atherosclerotic plaque. No evidence of aneurysm, dissection, vasculitis, or fibromuscular dysplasia. IMA: Patent without evidence of aneurysm, dissection, vasculitis or significant stenosis. Inflow: Patent without evidence of aneurysm, dissection, vasculitis or significant stenosis. Proximal Outflow: Bilateral common femoral and visualized portions of the superficial and profunda femoral arteries are patent without evidence of aneurysm, dissection, vasculitis or significant stenosis. Veins: No obvious venous abnormality within the limitations of this arterial phase study. Review  of the MIP images confirms the above findings. NON-VASCULAR Hepatobiliary: No focal hepatic lesions. No intra or extrahepatic biliary ductal dilation. Mildly distended gallbladder. Pancreas: No focal lesions or main ductal dilation. Spleen: Normal in size without focal abnormality. Adrenals/Urinary Tract: No adrenal nodules. No suspicious renal mass, calculi or hydronephrosis. No focal bladder wall thickening. Stomach/Bowel: Normal appearance of the stomach. No evidence of bowel wall thickening, distention, or inflammatory changes. Colonic diverticulosis without acute diverticulitis. Appendix is not discretely seen. Lymphatic: No enlarged abdominal or pelvic lymph nodes. Reproductive: Scattered coarse calcifications within the uterus, likely leiomyomata. No adnexal masses. Other: Small volume presacral edema. No free air or fluid collection. Musculoskeletal: No acute or abnormal lytic or blastic osseous lesions. Multilevel degenerative changes of the lumbar spine. Small fat-containing paraumbilical and right inguinal hernias. Review of the MIP images confirms the above findings. IMPRESSION: 1. No evidence of aortic aneurysm or dissection. 2. Mildly enlarged right hilar lymph node, nonspecific. 3. Otherwise, no acute intrathoracic, intra-abdominal, or intrapelvic abnormality. 4. Mildly distended gallbladder, nonspecific.  Recommend correlation with right upper quadrant tenderness. 5. Colonic diverticulosis without acute diverticulitis. 6. Aortic Atherosclerosis (ICD10-I70.0). Coronary artery calcifications. Assessment for potential risk factor modification, dietary therapy or pharmacologic therapy may be warranted, if clinically indicated. Electronically Signed   By: Limin  Xu M.D.   On: 10/24/2023 16:20   DG Chest 2 View Result Date: 10/24/2023 CLINICAL DATA:  Left shoulder and chest pain. EXAM: CHEST - 2 VIEW COMPARISON:  02/12/2023 FINDINGS: Right IJ dialysis catheter tip in the right atrium. Stable cardiomediastinal silhouette. Aortic atherosclerotic calcification. Pulmonary vascular congestion. No focal consolidation, pleural effusion, or pneumothorax. No displaced rib fractures. IMPRESSION: No change from 02/12/2023.  Mild pulmonary vascular congestion. Electronically Signed   By: Norman Gatlin M.D.   On: 10/24/2023 13:40    ROS: Pertinent items are noted in HPI. Physical Exam: Vitals:   10/25/23 0405 10/25/23 0530 10/25/23 0842 10/25/23 1214  BP: (!) 162/64 (!) 156/69 (!) 179/96 (!) 164/79  Pulse: 85 89    Resp: 20     Temp: 98.3 F (36.8 C)  98.1 F (36.7 C)   TempSrc: Oral  Axillary Oral  SpO2: 99% 94%    Weight:      Height:          Weight change:   Intake/Output Summary (Last 24 hours) at 10/25/2023 1235 Last data filed at 10/24/2023 2100 Gross per 24 hour  Intake --  Output 50 ml  Net -50 ml   BP (!) 164/79 (BP Location: Left Arm)   Pulse 89   Temp 98.1 F (36.7 C) (Axillary)   Resp 20   Ht 5' 3 (1.6 m)   Wt 80 kg   SpO2 94%   BMI 31.24 kg/m  General appearance: fatigued and no distress Head: Normocephalic, without obvious abnormality, atraumatic Resp: clear to auscultation bilaterally Cardio: regular rate and rhythm, S1, S2 normal, no murmur, click, rub or gallop GI: soft, non-tender; bowel sounds normal; no masses,  no organomegaly Extremities: extremities normal,  atraumatic, no cyanosis or edema Dialysis Access:  Dialysis Orders: Center: [facility] M[facility]   4h 350/1.5 EDW 78.9 kg  2/2 bath RIJ TDC  Heparin  2600 -profile 2 - last HD 12/31, post wt 80.4 kg   - hectorol  IV 4 mcg tiw - venofer  100mg  tiw thru 4/24 - mircera 200 mcg IV q 2 wks, due 10/27/23  Assessment/Plan:  Chest pain - troponins flat, likely demand ischemia.  Likely musculoskeletal source of pain given  reproduction with palpation.  ACS ruled out.  ESRD -  will plan on shortened HD session today since she had 2 hours of HD yesterday and will get back on her regular schedule on Friday as an outpatient.  Hypertension/volume  - elevated upon admission.  Will UF with HD and challenge edw.  Carvedilol  recently stopped due to bradycardia.  Would consider restarting at a lower dose while she remains an inpatient.  Anemia  - stable, due to micera on Friday.  Metabolic bone disease -  continue with home meds  Nutrition - renal diet, carb modified  Diabetes Mellitus type 2 - per primary svc  Disposition - hopeful discharge toady after HD or tomorrow.  Fairy RONAL Sellar, MD [facility], [facility] Pager [phone] 10/25/2023, 12:35 PM

## 2023-10-25 NOTE — Evaluation (Signed)
 Physical Therapy Evaluation Patient Details Name: Carolyn Russo MRN: [id number] DOB: [birth year] Today's Date: 10/25/2023  History of Present Illness  Pt is 73 yo female admitted on 10/24/23 with chest pain during HD -per notes demand ischemia in setting of ESRD.  Pt with hx including but not limited to ESRD on HD MWF, CHF, CVA with L residual weakness, R BKA, seizure, DM2  Clinical Impression  Pt admitted with above diagnosis. At baseline, pt resides at home with spouse.  She is normally able to transfer and ambulate short distance in home with some assist. Today, pt very lethargic (reports little sleep last night, last full session HD on Sunday and partial on Tue).  She required mod A x 2 for sit to stands and min A of 2 for pivots with tendency to prematurely sit.  Pt is below her baseline.  Spouse reports was at baseline prior to HD yesterday , so only recently declined and hopeful for good progression.  Pt did require assist of 2 for transfers today and would have difficulty managing at home at this level.  Would recommend post acute rehab unless pt able to progress quickly (potentially after full HD session and rest).  Pt currently with functional limitations due to the deficits listed below (see PT Problem List). Pt will benefit from acute skilled PT to increase their independence and safety with mobility to allow discharge.  Patient will benefit from continued inpatient follow up therapy, <3 hours/day at d/c unless able to quickly progress.           If plan is discharge home, recommend the following: A lot of help with walking and/or transfers;A lot of help with bathing/dressing/bathroom   Can travel by private vehicle   No    Equipment Recommendations None recommended by PT  Recommendations for Other Services       Functional Status Assessment Patient has had a recent decline in their functional status and demonstrates the ability to make significant improvements in function in a  reasonable and predictable amount of time.     Precautions / Restrictions Precautions Precautions: Fall Restrictions Weight Bearing Restrictions Per Provider Order: No      Mobility  Bed Mobility Overal bed mobility: Needs Assistance Bed Mobility: Supine to Sit, Sit to Supine     Supine to sit: Mod assist, +2 for physical assistance Sit to supine: Mod assist, +2 for physical assistance        Transfers Overall transfer level: Needs assistance Equipment used: Rolling walker (2 wheels) Transfers: Sit to/from Stand, Bed to chair/wheelchair/BSC Sit to Stand: Mod assist, +2 physical assistance   Step pivot transfers: Min assist, +2 physical assistance       General transfer comment: Stood from bed x 2 - first attempt not able to get completely upright, improved on 2nd attempt with mod A x 2.  Step pivot to chair with min A x 2 and premature sitting.  Positioned in chair and transport arrived for HD.  Stood again from chair with mod x 2 and cues to tuck buttock, step pivot back to bed with min A x 2 and premature sitting.    Ambulation/Gait Ambulation/Gait assistance: Min assist, +2 physical assistance Gait Distance (Feet): 2 Feet (2'x2) Assistive device: Rolling walker (2 wheels) Gait Pattern/deviations: Step-to pattern, Decreased stride length Gait velocity: decreased     General Gait Details: Step to pattern , shuffle, assist to keep L hand on RW, tends to premature sit  Stairs  Wheelchair Mobility     Tilt Bed    Modified Rankin (Stroke Patients Only)       Balance Overall balance assessment: Needs assistance Sitting-balance support: No upper extremity supported Sitting balance-Leahy Scale: Fair Sitting balance - Comments: Therapist and spouse donned prosthesis at EOB, pt needing close supervision for balance   Standing balance support: Bilateral upper extremity supported, Reliant on assistive device for balance Standing balance-Leahy  Scale: Poor Standing balance comment: RW and min A of 2 static stand                             Pertinent Vitals/Pain Pain Assessment Pain Assessment: Faces Faces Pain Scale: Hurts a little bit Pain Location: L shoulder Pain Descriptors / Indicators: Discomfort Pain Intervention(s): Limited activity within patient's tolerance, Monitored during session, Other (comment) (request voltaran - RN applied)    Home Living Family/patient expects to be discharged to:: Private residence Living Arrangements: Spouse/significant other Available Help at Discharge: Family;Available PRN/intermittently Type of Home: House Home Access: Ramped entrance       Home Layout: Multi-level;Able to live on main level with bedroom/bathroom Home Equipment: Rolling Walker (2 wheels);Cane - single point;Hospital bed;Other (comment);Shower seat;Wheelchair - manual      Prior Function Prior Level of Function : Needs assist             Mobility Comments: transfers with RW SPT and walks with RW into restroom and back to bed with some assist ADLs Comments: aides and nurses 7 days/week almost around the clock. Dialysis 3x/week for 4 hours MWF.  Aide helps with bathing legs, bathroom, transfers, getting dressed .     Extremity/Trunk Assessment   Upper Extremity Assessment Upper Extremity Assessment: Defer to OT evaluation    Lower Extremity Assessment Lower Extremity Assessment: LLE deficits/detail;RLE deficits/detail RLE Deficits / Details: Hx R BKA.   ROM WFL; MMT 3/5 LLE Deficits / Details: ROM WFL; MMT: 4/5    Cervical / Trunk Assessment Cervical / Trunk Assessment: Kyphotic  Communication   Communication Communication: Hearing impairment  Cognition Arousal: Lethargic Behavior During Therapy: WFL for tasks assessed/performed Overall Cognitive Status: Impaired/Different from baseline                                 General Comments: Pt lethargic and needing increased  cues at time        General Comments General comments (skin integrity, edema, etc.): Spouse present and encouraged pt.    Exercises     Assessment/Plan    PT Assessment Patient needs continued PT services  PT Problem List Decreased strength;Cardiopulmonary status limiting activity;Decreased coordination;Decreased range of motion;Decreased cognition;Decreased activity tolerance;Decreased knowledge of use of DME;Decreased balance;Decreased mobility;Decreased safety awareness;Decreased knowledge of precautions       PT Treatment Interventions DME instruction;Therapeutic exercise;Gait training;Balance training;Functional mobility training;Therapeutic activities;Patient/family education;Cognitive remediation;Modalities;Wheelchair mobility training;Neuromuscular re-education    PT Goals (Current goals can be found in the Care Plan section)  Acute Rehab PT Goals Patient Stated Goal: return home PT Goal Formulation: With patient/family Time For Goal Achievement: 11/08/23 Potential to Achieve Goals: Good    Frequency Min 1X/week     Co-evaluation PT/OT/SLP Co-Evaluation/Treatment: Yes Reason for Co-Treatment: For patient/therapist safety;Complexity of the patient's impairments (multi-system involvement)           AM-PAC PT 6 Clicks Mobility  Outcome Measure Help needed turning from your back to your  side while in a flat bed without using bedrails?: A Lot Help needed moving from lying on your back to sitting on the side of a flat bed without using bedrails?: Total Help needed moving to and from a bed to a chair (including a wheelchair)?: Total Help needed standing up from a chair using your arms (e.g., wheelchair or bedside chair)?: Total Help needed to walk in hospital room?: Total Help needed climbing 3-5 steps with a railing? : Total 6 Click Score: 7    End of Session Equipment Utilized During Treatment: Gait belt Activity Tolerance: Patient limited by lethargy (reports  little sleep last night and last full session HD Sunday and partial on Tue) Patient left: in bed (with HD transport staff) Nurse Communication: Mobility status;Need for lift equipment PT Visit Diagnosis: Other abnormalities of gait and mobility (R26.89);Muscle weakness (generalized) (M62.81)    Time: 1340-1420 PT Time Calculation (min) (ACUTE ONLY): 40 min   Charges:   PT Evaluation $PT Eval Low Complexity: 1 Low PT Treatments $Therapeutic Activity: 8-22 mins PT General Charges $$ ACUTE PT VISIT: 1 Visit         Benjiman, PT Acute Rehab [facility] Re[facility] [phone]   Benjiman VEAR Mulberry 10/25/2023, 2:39 PM

## 2023-10-25 NOTE — Evaluation (Signed)
 Occupational Therapy Evaluation Patient Details Name: Carolyn Russo MRN: [id number] DOB: 08-30-1951 Today's Date: 10/25/2023   History of Present Illness Pt is 73 yo female admitted on 10/24/23 with chest pain during HD -per notes demand ischemia in setting of ESRD.  Pt with hx including but not limited to ESRD on HD MWF, CHF, CVA with L residual weakness, R BKA, seizure, DM2   Clinical Impression   PTA, pt lived with husband and had aides to assist with mobility and ADL nearly 24 hours/day. Per report, aides do not accompany pt to HD appts. Pt typically able to self feed and tranfers with some assist. Last full HD session on Sunday. Today, pt needing mod A +2 for STS and SPT transers and total A for LB ADL. Cues for initiation of all movement with LUE. Minor redness from on of her finger nails in her palm; encouraged husband to remind her to rest with hand in an open position. Patient will benefit from continued inpatient follow up therapy, <3 hours/day       If plan is discharge home, recommend the following: A lot of help with bathing/dressing/bathroom;Two people to help with walking and/or transfers;Assistance with cooking/housework;Direct supervision/assist for medications management;Assistance with feeding;Direct supervision/assist for financial management;Assist for transportation;Help with stairs or ramp for entrance    Functional Status Assessment  Patient has had a recent decline in their functional status and demonstrates the ability to make significant improvements in function in a reasonable and predictable amount of time.  Equipment Recommendations  Tub/shower bench    Recommendations for Other Services       Precautions / Restrictions Precautions Precautions: Fall Restrictions Weight Bearing Restrictions Per Provider Order: No      Mobility Bed Mobility Overal bed mobility: Needs Assistance Bed Mobility: Supine to Sit, Sit to Supine     Supine to sit: Mod assist,  +2 for physical assistance Sit to supine: Mod assist, +2 for physical assistance        Transfers Overall transfer level: Needs assistance Equipment used: Rolling walker (2 wheels) Transfers: Sit to/from Stand, Bed to chair/wheelchair/BSC Sit to Stand: Mod assist, +2 physical assistance     Step pivot transfers: Min assist, +2 physical assistance     General transfer comment: Stood from bed x 2 - first attempt not able to get completely upright, improved on 2nd attempt with mod A x 2.  Step pivot to chair with min A x 2 and premature sitting.  Positioned in chair and transport arrived for HD.  Stood again from chair with mod x 2 and cues to tuck buttock, step pivot back to bed with min A x 2 and premature sitting.      Balance Overall balance assessment: Needs assistance Sitting-balance support: No upper extremity supported Sitting balance-Leahy Scale: Fair Sitting balance - Comments: Therapist and spouse donned prosthesis at EOB, pt needing close supervision for balance   Standing balance support: Bilateral upper extremity supported, Reliant on assistive device for balance Standing balance-Leahy Scale: Poor Standing balance comment: RW and min A of 2 static stand                           ADL either performed or assessed with clinical judgement   ADL Overall ADL's : Needs assistance/impaired Eating/Feeding: Minimal assistance;Sitting   Grooming: Minimal assistance;Sitting   Upper Body Bathing: Moderate assistance;Sitting   Lower Body Bathing: Maximal assistance;Sit to/from stand;+2 for safety/equipment   Upper Body  Dressing : Sitting;Maximal assistance   Lower Body Dressing: +2 for physical assistance;+2 for safety/equipment;Total assistance   Toilet Transfer: Moderate assistance;+2 for physical assistance;+2 for safety/equipment;Stand-pivot                   Vision         Perception         Praxis         Pertinent Vitals/Pain Pain  Assessment Pain Assessment: Faces Faces Pain Scale: Hurts a little bit Pain Location: L shoulder Pain Descriptors / Indicators: Discomfort Pain Intervention(s): Limited activity within patient's tolerance, Monitored during session (request voltaran, RN applied)     Extremity/Trunk Assessment Upper Extremity Assessment Upper Extremity Assessment: Generalized weakness;LUE deficits/detail LUE Deficits / Details: decreased initiation; ~15 degrees active shoulder flexion, 30 degrees shoulder abduction, ~100 degrees elbow flexion, Able to extend digits and make fist all with increased time. LUE Sensation: decreased light touch   Lower Extremity Assessment Lower Extremity Assessment: Defer to PT evaluation RLE Deficits / Details: Hx R BKA.   ROM WFL; MMT 3/5 LLE Deficits / Details: ROM WFL; MMT: 4/5   Cervical / Trunk Assessment Cervical / Trunk Assessment: Kyphotic   Communication Communication Communication: Hearing impairment   Cognition Arousal: Lethargic Behavior During Therapy: WFL for tasks assessed/performed Overall Cognitive Status: Impaired/Different from baseline                                 General Comments: cognitive impairment at baseline per previous therapy notes, lethargic and needing increaed cues throughout. Cues for initiation on L     General Comments  spouse present and supportive    Exercises     Shoulder Instructions      Home Living Family/patient expects to be discharged to:: Private residence Living Arrangements: Spouse/significant other Available Help at Discharge: Family;Available PRN/intermittently Type of Home: House Home Access: Ramped entrance     Home Layout: Multi-level;Able to live on main level with bedroom/bathroom     Bathroom Shower/Tub: Walk-in shower;Tub/shower unit   Bathroom Toilet: Standard Bathroom Accessibility: Yes How Accessible: Accessible via walker Home Equipment: Rolling Walker (2 wheels);Cane -  single point;Hospital bed;Other (comment);Shower seat;Wheelchair - manual          Prior Functioning/Environment Prior Level of Function : Needs assist             Mobility Comments: transfers with RW SPT and walks with RW into restroom and back to bed with some assist ADLs Comments: aides and nurses 7 days/week almost around the clock. Dialysis 3x/week for 4 hours MWF.  Aide helps with bathing legs, bathroom, transfers, getting dressed .        OT Problem List: Decreased strength;Impaired balance (sitting and/or standing);Decreased safety awareness;Decreased cognition;Decreased coordination;Decreased activity tolerance;Decreased knowledge of use of DME or AE;Impaired sensation;Impaired UE functional use      OT Treatment/Interventions: Self-care/ADL training;Therapeutic exercise;DME and/or AE instruction;Balance training;Patient/family education;Neuromuscular education;Visual/perceptual remediation/compensation;Cognitive remediation/compensation;Therapeutic activities    OT Goals(Current goals can be found in the care plan section) Acute Rehab OT Goals Patient Stated Goal: go home OT Goal Formulation: With patient Time For Goal Achievement: 11/08/23 Potential to Achieve Goals: Good  OT Frequency: Min 1X/week    Co-evaluation PT/OT/SLP Co-Evaluation/Treatment: Yes Reason for Co-Treatment: For patient/therapist safety;Complexity of the patient's impairments (multi-system involvement) PT goals addressed during session: Mobility/safety with mobility OT goals addressed during session: Proper use of Adaptive equipment and DME  AM-PAC OT 6 Clicks Daily Activity     Outcome Measure Help from another person eating meals?: A Little Help from another person taking care of personal grooming?: A Little Help from another person toileting, which includes using toliet, bedpan, or urinal?: A Lot Help from another person bathing (including washing, rinsing, drying)?: A Lot Help from  another person to put on and taking off regular upper body clothing?: A Lot Help from another person to put on and taking off regular lower body clothing?: Total 6 Click Score: 13   End of Session Equipment Utilized During Treatment: Gait belt;Rolling walker (2 wheels);Other (comment) (RLE prosthesis) Nurse Communication: Mobility status  Activity Tolerance: Patient tolerated treatment well;Patient limited by lethargy Patient left: in bed;with call bell/phone within reach;Other (comment) (transport in room to take pt to dialysis)  OT Visit Diagnosis: Unsteadiness on feet (R26.81);Muscle weakness (generalized) (M62.81);Other symptoms and signs involving cognitive function;Hemiplegia and hemiparesis Hemiplegia - Right/Left: Left Hemiplegia - dominant/non-dominant: Non-Dominant Hemiplegia - caused by: Cerebral infarction                Time: 8660-8586 OT Time Calculation (min): 34 min Charges:  OT General Charges $OT Visit: 1 Visit OT Evaluation $OT Eval Moderate Complexity: 1 Mod  Elma JONETTA Lebron FREDERICK, OTR/L [facility] Acute Rehabilitation Office: [phone]   Elma JONETTA Lebron 10/25/2023, 4:33 PM

## 2023-10-25 NOTE — Progress Notes (Signed)
 PROGRESS NOTE    Carolyn Russo  FMW:[id number] DOB: [birth year] DOA: 10/24/2023 PCP: Karlene Mungo, MD   Brief Narrative:  HPI: Carolyn Russo is a 73 y.o. female with medical history significant of ESRD on HD, CHF, CVA with residual left-sided weakness, right BKA, seizure disorder, diabetes mellitus type 2 who presents  with sudden onset of severe chest and back pain on the left side during her dialysis session this morning.  Most of history is obtained from the patient's husband who is present at bedside.  The pain was accompanied by severe nausea, but no vomiting or sweating was reported. The patient had been attending dialysis sessions regularly, three times a week, and had not missed any recent sessions.  Patient does report having some shortness of breath at current time.  Denies having any significant cough.   In the past few weeks, the patient has been experiencing increased agitation, difficulty sleeping, and elevated blood pressure. The patient's spouse reported that these symptoms began after the cessation of a beta blocker medication due to concerns about slow pulses and pauses in the heart rate.   The patient also reported a history of amputation, and has been experiencing increased difficulty with mobility and associated pain. The patient's spouse suggested that this could be due to a lack of exercise and poor posture.   The patient has a history of diabetes, which has been well-managed. The patient also reported to have undergone  recent mammogram, after which seem to onset her complaints of chest pain. A subsequent chest x-ray was negative.   The patient's spouse also reported concerns about possible dementia, as the patient has been repeating herself and seeming not to understand instructions. The patient requested a urinary tract infection test, as she still produces urine.    Upon admission into the emergency department patient was noted to be afebrile with blood pressures  elevated at 177/107, and O2 saturations currently maintained on room air.  Labs significant for hemoglobin 10, platelets 442, sodium 132, potassium 3.3, BUN 19, creatinine 4.01, and high-sensitivity troponin 66.  Chest x-ray normal unchanged from prior imaging from 02/12/23 mild pulmonary vascular congestion.  Patient had been given fentanyl  50 mcg IV and full dose aspirin   Assessment & Plan:   Principal Problem:   Chest pain Active Problems:   Elevated troponin   ESRD on dialysis (HCC)   Hypertensive urgency   Hypokalemia   Type 2 diabetes mellitus (HCC)   History of CVA with residual deficit   Memory loss   Primary insomnia   PAD (peripheral artery disease) (HCC)   Seizure disorder (HCC)   Dyslipidemia (high LDL; low HDL)   GERD (gastroesophageal reflux disease)  Chest pain / Elevated troponin, POA: Confirmed with the husband, patient has been complaining of chest pain as well as shoulder pain at times recently but intermittently.  Patient still complains of chest pain.  On my examination, she has point tenderness to the anterior chest suggesting possible musculoskeletal cause of the pain.  Troponins are only slightly elevated and flat, consistent with only demand ischemia in the setting of ESRD.  ACS ruled out.     ESRD on HD Typical dialysis days are Monday Wednesday Friday but has been changed due to holidays, per husband.  She received 2 hours of dialysis Tuesday instead of 3 hours due to chest pain.  Nephrology on board.  Discussed with Dr. Rayburn, plan for dialysis today.   Hypertensive urgency On admission blood pressures were noted to  be elevated up to 177/107.  Patient's husband notes that blood pressures have been elevated ever since Coreg  was discontinued due to bradycardia.  Per husband, patient on losartan  50 mg twice daily and hydralazine  50 mg twice daily.  Blood pressure improved but still elevated.  Noted that she has not been giving her morning antihypertensive  medications.  Discussed with the nurses to give the medications and check blood pressure an hour later.  If remains elevated, plan to increase hydralazine  to 75 mg 3 times daily.   Hypokalemia Replenished and resolved.   Controlled diabetes mellitus type 2, with long-term use of insulin  On admission glucose noted to be 126.  At home patient on a very sensitive SSI. -Hypoglycemic protocols -CBGs before every meal with very sensitive SSI -Adjust regimen as needed.   History of CVA with residual deficit Following prior stroke patient is residual left-sided deficits.  Patient finished outpatient PT recently, has been requesting PT OT evaluation here.  I have ordered that.   Memory loss Patient has been noticed that over the last couple of weeks her memory has progressively declined.  She has been repeating things and often getting agitated at night.  He has concerns for dementia, but she has not been formally tested. -Delirium precautions.  Patient currently fully alert and oriented. -May warrant Seroquel  overnight if noted to have significant agitation -Recommend outpatient referral to neurology   Insomnia Patient's husband notes that she previously had been on trazodone  for issues with insomnia at night, but was recently switched to amitriptyline . -Continue amitriptyline .  Consider increasing dose -Melatonin added nightly.   Peripheral vascular disease Status post right BKA   History of seizures -Continue Keppra    Dyslipidemia -Continue Crestor    GERD -Continue Protonix    Obesity BMI 31.24 kg/m  DVT prophylaxis: heparin  injection 5,000 Units Start: 10/24/23 2200 SCDs Start: 10/24/23 1538   Code Status: Full Code  Family Communication: Husband present at bedside.  Plan of care discussed with patient in length and he/she verbalized understanding and agreed with it.  Status is: Observation The patient will require care spanning > 2 midnights and should be moved to inpatient  because: Getting dialysis today and blood pressure still elevated needs adjustment of medications.   Estimated body mass index is 31.24 kg/m as calculated from the following:   Height as of this encounter: 5' 3 (1.6 m).   Weight as of this encounter: 80 kg.    Nutritional Assessment: Body mass index is 31.24 kg/m.Carolyn Russo Seen by dietician.  I agree with the assessment and plan as outlined below: Nutrition Status:        . Skin Assessment: I have examined the patient's skin and I agree with the wound assessment as performed by the wound care RN as outlined below:    Consultants:  Nephrology  Procedures:  None  Antimicrobials:  Anti-infectives (From admission, onward)    None         Subjective: Seen and examined, she is slightly sleepy but easily arousable and fully alert and oriented.  Did complain of chest pain but has point tenderness.  No other complaint.  Objective: Vitals:   10/25/23 0353 10/25/23 0405 10/25/23 0530 10/25/23 0842  BP: (!) 153/133 (!) 162/64 (!) 156/69 (!) 179/96  Pulse: 99 85 89   Resp:  20    Temp:  98.3 F (36.8 C)  98.1 F (36.7 C)  TempSrc:  Oral  Axillary  SpO2: 96% 99% 94%   Weight:  Height:        Intake/Output Summary (Last 24 hours) at 10/25/2023 1142 Last data filed at 10/24/2023 2100 Gross per 24 hour  Intake --  Output 50 ml  Net -50 ml   Filed Weights   10/24/23 1059 10/24/23 1730  Weight: 79.4 kg 80 kg    Examination:  General exam: Appears calm and comfortable  Respiratory system: Clear to auscultation. Respiratory effort normal.,  Point tenderness to all of the anterior chest. Cardiovascular system: S1 & S2 heard, RRR. No JVD, murmurs, rubs, gallops or clicks. No pedal edema. Gastrointestinal system: Abdomen is nondistended, soft and nontender. No organomegaly or masses felt. Normal bowel sounds heard. Central nervous system: Sleepy but arousable and oriented. No focal neurological deficits. Extremities:  Symmetric 5 x 5 power. Skin: No rashes, lesions or ulcers   Data Reviewed: I have personally reviewed following labs and imaging studies  CBC: Recent Labs  Lab 10/24/23 1220 10/25/23 0521  WBC 7.9 7.6  HGB 10.0* 9.8*  HCT 28.9* 28.4*  MCV 83.0 82.8  PLT 442* 400   Basic Metabolic Panel: Recent Labs  Lab 10/24/23 1125 10/25/23 0521  NA 132* 132*  K 3.3* 3.9  CL 94* 94*  CO2 26 25  GLUCOSE 126* 88  BUN 19 24*  CREATININE 4.01* 5.01*  CALCIUM  8.2* 8.5*  PHOS  --  3.9   GFR: Estimated Creatinine Clearance: 9.9 mL/min (A) (by C-G formula based on SCr of 5.01 mg/dL (H)). Liver Function Tests: Recent Labs  Lab 10/24/23 1125 10/25/23 0521  AST 18  --   ALT 14  --   ALKPHOS 123  --   BILITOT 0.4  --   PROT 7.5  --   ALBUMIN  3.1* 2.9*   No results for input(s): LIPASE, AMYLASE in the last 168 hours. No results for input(s): AMMONIA in the last 168 hours. Coagulation Profile: No results for input(s): INR, PROTIME in the last 168 hours. Cardiac Enzymes: Recent Labs  Lab 10/24/23 1743  CKTOTAL 100   BNP (last 3 results) No results for input(s): PROBNP in the last 8760 hours. HbA1C: No results for input(s): HGBA1C in the last 72 hours. CBG: Recent Labs  Lab 10/24/23 1742 10/24/23 2106 10/25/23 0848  GLUCAP 111* 122* 91   Lipid Profile: No results for input(s): CHOL, HDL, LDLCALC, TRIG, CHOLHDL, LDLDIRECT in the last 72 hours. Thyroid  Function Tests: No results for input(s): TSH, T4TOTAL, FREET4, T3FREE, THYROIDAB in the last 72 hours. Anemia Panel: No results for input(s): VITAMINB12, FOLATE, FERRITIN, TIBC, IRON , RETICCTPCT in the last 72 hours. Sepsis Labs: No results for input(s): PROCALCITON, LATICACIDVEN in the last 168 hours.  No results found for this or any previous visit (from the past 240 hours).   Radiology Studies: CT Angio Chest/Abd/Pel for Dissection W and/or Wo Contrast Result Date:  10/24/2023 CLINICAL DATA:  Two-week history of left chest and shoulder pain radiating into the back. Hypertension. EXAM: CT ANGIOGRAPHY CHEST, ABDOMEN AND PELVIS TECHNIQUE: Non-contrast CT of the chest was initially obtained. Multidetector CT imaging through the chest, abdomen and pelvis was performed using the standard protocol during bolus administration of intravenous contrast. Multiplanar reconstructed images and MIPs were obtained and reviewed to evaluate the vascular anatomy. RADIATION DOSE REDUCTION: This exam was performed according to the departmental dose-optimization program which includes automated exposure control, adjustment of the mA and/or kV according to patient size and/or use of iterative reconstruction technique. CONTRAST:  80mL OMNIPAQUE  IOHEXOL  350 MG/ML SOLN COMPARISON:  Same day  chest radiograph, CT chest, abdomen, and pelvis dated 05/22/2022 FINDINGS: CTA CHEST FINDINGS Cardiovascular: Right IJ central venous catheter tip terminates in the right atrium. Preferential opacification of the thoracic aorta. No evidence of thoracic aortic aneurysm or dissection. Mild multichamber cardiomegaly. No pericardial effusion. Coronary artery calcifications. Mediastinum/Nodes: Imaged thyroid  gland without nodules meeting criteria for imaging follow-up by size. Patulous esophagus. Right hilar lymph node measures 11 mm. Lungs/Pleura: The central airways are patent. No focal consolidation. Subsegmental lingular and left lower lobe atelectasis. No pneumothorax. No pleural effusion. Musculoskeletal: No acute or abnormal lytic or blastic osseous lesions. Multilevel degenerative changes of the thoracic spine. Review of the MIP images confirms the above findings. CTA ABDOMEN AND PELVIS FINDINGS VASCULAR Aorta: Aortic atherosclerosis. Normal caliber aorta without aneurysm, dissection, vasculitis or significant stenosis. Celiac: Patent without evidence of aneurysm, dissection, vasculitis or significant stenosis.  SMA: Patent without evidence of aneurysm, dissection, vasculitis or significant stenosis. Renals: Moderate narrowing of the right renal artery origin due to atherosclerotic plaque. No evidence of aneurysm, dissection, vasculitis, or fibromuscular dysplasia. IMA: Patent without evidence of aneurysm, dissection, vasculitis or significant stenosis. Inflow: Patent without evidence of aneurysm, dissection, vasculitis or significant stenosis. Proximal Outflow: Bilateral common femoral and visualized portions of the superficial and profunda femoral arteries are patent without evidence of aneurysm, dissection, vasculitis or significant stenosis. Veins: No obvious venous abnormality within the limitations of this arterial phase study. Review of the MIP images confirms the above findings. NON-VASCULAR Hepatobiliary: No focal hepatic lesions. No intra or extrahepatic biliary ductal dilation. Mildly distended gallbladder. Pancreas: No focal lesions or main ductal dilation. Spleen: Normal in size without focal abnormality. Adrenals/Urinary Tract: No adrenal nodules. No suspicious renal mass, calculi or hydronephrosis. No focal bladder wall thickening. Stomach/Bowel: Normal appearance of the stomach. No evidence of bowel wall thickening, distention, or inflammatory changes. Colonic diverticulosis without acute diverticulitis. Appendix is not discretely seen. Lymphatic: No enlarged abdominal or pelvic lymph nodes. Reproductive: Scattered coarse calcifications within the uterus, likely leiomyomata. No adnexal masses. Other: Small volume presacral edema. No free air or fluid collection. Musculoskeletal: No acute or abnormal lytic or blastic osseous lesions. Multilevel degenerative changes of the lumbar spine. Small fat-containing paraumbilical and right inguinal hernias. Review of the MIP images confirms the above findings. IMPRESSION: 1. No evidence of aortic aneurysm or dissection. 2. Mildly enlarged right hilar lymph node,  nonspecific. 3. Otherwise, no acute intrathoracic, intra-abdominal, or intrapelvic abnormality. 4. Mildly distended gallbladder, nonspecific. Recommend correlation with right upper quadrant tenderness. 5. Colonic diverticulosis without acute diverticulitis. 6. Aortic Atherosclerosis (ICD10-I70.0). Coronary artery calcifications. Assessment for potential risk factor modification, dietary therapy or pharmacologic therapy may be warranted, if clinically indicated. Electronically Signed   By: Limin  Xu M.D.   On: 10/24/2023 16:20   DG Chest 2 View Result Date: 10/24/2023 CLINICAL DATA:  Left shoulder and chest pain. EXAM: CHEST - 2 VIEW COMPARISON:  02/12/2023 FINDINGS: Right IJ dialysis catheter tip in the right atrium. Stable cardiomediastinal silhouette. Aortic atherosclerotic calcification. Pulmonary vascular congestion. No focal consolidation, pleural effusion, or pneumothorax. No displaced rib fractures. IMPRESSION: No change from 02/12/2023.  Mild pulmonary vascular congestion. Electronically Signed   By: Norman Gatlin M.D.   On: 10/24/2023 13:40    Scheduled Meds:  amitriptyline   50 mg Oral QHS   aspirin  EC  81 mg Oral Daily   calcitRIOL   0.5 mcg Oral Q M,W,F   Chlorhexidine  Gluconate Cloth  6 each Topical Daily   cinacalcet   30 mg Oral Q breakfast  diclofenac  Sodium  2 g Topical QID   gabapentin   300 mg Oral QHS   heparin  injection (subcutaneous)  5,000 Units Subcutaneous Q8H   hydrALAZINE   50 mg Oral BID   levETIRAcetam   375 mg Oral Q M,W,F-1800   levETIRAcetam   750 mg Oral QHS   loratadine   10 mg Oral Daily   losartan   50 mg Oral Daily   melatonin  5 mg Oral QHS   mometasone -formoterol   2 puff Inhalation BID   pantoprazole   40 mg Oral QHS   rosuvastatin   20 mg Oral QHS   sevelamer  carbonate  0.8 g Oral TID with meals   sodium chloride  flush  3 mL Intravenous Q12H   ticagrelor   90 mg Oral BID   Continuous Infusions:   LOS: 0 days   Fredia Skeeter, MD [facility]  10/25/2023, 11:42 AM   *Please note that this is a verbal dictation therefore any spelling or grammatical errors are due to the Dragon Medical One system interpretation.  Please page via Amion and do not message via secure chat for urgent patient care matters. Secure chat can be used for non urgent patient care matters.  How to contact the [facility] Attending or Consulting provider 7A - 7P or covering provider during after hours 7P -7A, for this patient?  Check the care team in [facility] and look for a) attending/consulting [facility] provider listed and b) the [facility] team listed. Page or secure chat 7A-7P. Log into [email or web address] and use [place]'s universal password to access. If you do not have the password, please contact the hospital operator. Locate the [facility] provider you are looking for under [facility] and page to a number that you can be directly reached. If you still have difficulty reaching the provider, please page the [facility] (Director on Call) for the Hospitalists listed on amion for assistance.

## 2023-10-25 NOTE — TOC Initial Note (Signed)
 Transition of Care [facility]) - Initial/Assessment Note    Patient Details  Name: Carolyn Russo MRN: [id number] Date of Birth: November 13, 1950  Transition of Care [facility]) CM/SW Contact:    Carolyn KANDICE Stain, RN Phone Number: 10/25/2023, 11:42 AM  Clinical Narrative:                 Spoke to patient and husband at bedside.  Husband, [name], stated patient has used [name] home health in the past. [name] would like to use [name] if home health is interested. [name] is requesting PT, OT eval. This RNCM requested PT, OT eval order from MD.  Patient goes to HD MWF and Husband transfers. Patient has wheelchair, hospital bed, walker at home.  Address, Phone number and PCP verified. TOC following for needs.  Expected Discharge Plan: Home w Home Health Services Barriers to Discharge: Continued Medical Work up   Patient Goals and CMS Choice Patient states their goals for this hospitalization and ongoing recovery are:: return home          Expected Discharge Plan and Services   Discharge Planning Services: CM Consult Post Acute Care Choice: Home Health Living arrangements for the past 2 months: Single Family Home                                      Prior Living Arrangements/Services Living arrangements for the past 2 months: Single Family Home Lives with:: Spouse Patient language and need for interpreter reviewed:: Yes Do you feel safe going back to the place where you live?: Yes      Need for Family Participation in Patient Care: Yes (Comment) Care giver support system in place?: Yes (comment) Current home services: DME (wheelchair, walker, hosptial bed) Criminal Activity/Legal Involvement Pertinent to Current Situation/Hospitalization: No - Comment as needed  Activities of Daily Living      Permission Sought/Granted                  Emotional Assessment Appearance:: Appears stated age Attitude/Demeanor/Rapport: Engaged Affect (typically observed):  Accepting Orientation: : Oriented to Self, Oriented to Place, Oriented to  Time, Oriented to Situation Alcohol / Substance Use: Not Applicable Psych Involvement: No (comment)  Admission diagnosis:  Myalgia [M79.10] ESRD (end stage renal disease) (HCC) [N18.6] Chest pain [R07.9] Chest pain, unspecified type [R07.9] Patient Active Problem List   Diagnosis Date Noted   Elevated troponin 10/24/2023   Hypokalemia 10/24/2023   Memory loss 10/24/2023   History of seizure disorder 10/24/2023   GERD (gastroesophageal reflux disease) 10/24/2023   Primary insomnia 09/11/2023   Wheelchair dependence 06/09/2023   Hx of right BKA (HCC) 03/10/2023   CHF (congestive heart failure) (HCC) 02/10/2023   Dyspnea 02/09/2023   Acute respiratory distress 02/09/2023   Urinary tract infection without hematuria    Diabetes mellitus due to underlying condition with hypoglycemia without coma, with long-term current use of insulin  (HCC)    Seizure disorder (HCC) 06/22/2022   Acute respiratory failure (HCC)    Unresponsiveness    Pressure ulcer 06/07/2022   Acute stroke due to ischemia (HCC) 06/03/2022   Chest pain 06/01/2022   History of CVA with residual deficit 06/01/2022   Seizure (HCC)    Encephalopathy    Below-knee amputation of right lower extremity (HCC)    Leukocytosis    Foot infection 05/10/2022   PAD (peripheral artery disease) (HCC) 05/10/2022   Gangrene of  right foot (HCC) 04/15/2022   Left hemiparesis (HCC) 09/24/2021   Abnormality of gait 09/24/2021   Pain of right hand 08/06/2021   Steal syndrome of dialysis vascular access (HCC) 08/06/2021   Subclavian steal syndrome of right subclavian artery 06/25/2021   Right hand weakness 06/25/2021   Stroke-like symptoms 05/06/2021   Bandemia    Cerebrovascular accident (CVA) of right basal ganglia (HCC) 04/20/2021   Hemorrhoids    Basal ganglia infarction (HCC) 04/16/2021   ESRD on dialysis (HCC) 04/16/2021   Hypertensive urgency  03/10/2021   Hilar enlargement    Anemia of chronic disease    Chronic diastolic CHF (congestive heart failure) (HCC) 03/09/2021   CKD (chronic kidney disease), stage V (HCC) 03/08/2021   Diabetic retinopathy (HCC) 03/08/2021   Hyperglycemia due to type 2 diabetes mellitus (HCC) 03/08/2021   Pure hypercholesterolemia 03/08/2021   Anemia in chronic kidney disease 09/24/2020   Uncontrolled type 2 diabetes mellitus with hyperglycemia (HCC) 03/19/2019   Osteopenia 09/04/2018   Migraine 09/04/2018   Asthma 09/04/2018   Pseudophakia of both eyes 07/17/2018   Pseudophakia of left eye 07/03/2018   Open angle with borderline findings and high glaucoma risk in left eye 07/03/2018   Age-related nuclear cataract of right eye 07/03/2018   CAD (coronary artery disease), native coronary artery 04/27/2017   Abnormal nuclear stress test 04/04/2017   Shortness of breath 03/09/2017   Appendicitis    Age-related hypermature cataract of both eyes 12/20/2016   Uterine leiomyoma 11/27/2012   Dyslipidemia (high LDL; low HDL) 11/25/2011   Obesity (BMI 30-39.9) 11/25/2011   Hypertensive disorder 11/21/2011   Type 2 diabetes mellitus (HCC) 11/21/2011   Abnormal EKG 11/21/2011   PCP:  Karlene Mungo, MD Pharmacy:   [facility] [phone] [place], [place] - [address] AT [facility] [address] & [facility] [address] [place] [address] Phone: [phone] Fax: [phone]     Social Drivers of Health (SDOH) Social History: SDOH Screenings   Food Insecurity: No Food Insecurity (02/10/2023)  Housing: Low Risk  (02/09/2023)  Transportation Needs: No Transportation Needs (02/09/2023)  Utilities: Not At Risk (02/09/2023)  Alcohol Screen: Low Risk  (01/09/2019)  Depression (PHQ2-9): Low Risk  (09/11/2023)  Financial Resource Strain: Low Risk  (09/02/2021)  Physical Activity: Inactive (09/02/2021)  Social Connections: Socially Integrated (01/09/2019)  Stress: Stress Concern Present  (09/02/2021)  Tobacco Use: Low Risk  (10/24/2023)   SDOH Interventions:     Readmission Risk Interventions    02/14/2023    4:54 PM  Readmission Risk Prevention Plan  Transportation Screening Complete  PCP or Specialist Appt within 3-5 Days Complete  HRI or Home Care Consult Complete  Social Work Consult for Recovery Care Planning/Counseling Complete  Palliative Care Screening Complete  Medication Review Oceanographer) Complete

## 2023-10-26 ENCOUNTER — Ambulatory Visit: Payer: Medicare PPO | Admitting: Obstetrics and Gynecology

## 2023-10-26 DIAGNOSIS — R079 Chest pain, unspecified: Secondary | ICD-10-CM | POA: Diagnosis not present

## 2023-10-26 LAB — RENAL FUNCTION PANEL
Albumin: 2.8 g/dL — ABNORMAL LOW (ref 3.5–5.0)
Anion gap: 11 (ref 5–15)
BUN: 18 mg/dL (ref 8–23)
CO2: 27 mmol/L (ref 22–32)
Calcium: 8.1 mg/dL — ABNORMAL LOW (ref 8.9–10.3)
Chloride: 94 mmol/L — ABNORMAL LOW (ref 98–111)
Creatinine, Ser: 4.57 mg/dL — ABNORMAL HIGH (ref 0.44–1.00)
GFR, Estimated: 10 mL/min — ABNORMAL LOW (ref >60)
Glucose, Bld: 68 mg/dL — ABNORMAL LOW (ref 70–99)
Phosphorus: 4.1 mg/dL (ref 2.5–4.6)
Potassium: 3.5 mmol/L (ref 3.5–5.1)
Sodium: 132 mmol/L — ABNORMAL LOW (ref 135–145)

## 2023-10-26 LAB — HEMOGLOBIN A1C
Hgb A1c MFr Bld: 5.8 % — ABNORMAL HIGH (ref 4.8–5.6)
Mean Plasma Glucose: 120 mg/dL

## 2023-10-26 LAB — GLUCOSE, CAPILLARY
Glucose-Capillary: 104 mg/dL — ABNORMAL HIGH (ref 70–99)
Glucose-Capillary: 98 mg/dL (ref 70–99)
Glucose-Capillary: 142 mg/dL — ABNORMAL HIGH (ref 70–99)

## 2023-10-26 LAB — HEPATITIS B SURFACE ANTIBODY, QUANTITATIVE: Hep B S AB Quant (Post): 7884 m[IU]/mL

## 2023-10-26 MED ORDER — LOSARTAN POTASSIUM 50 MG PO TABS
50.0000 mg | ORAL_TABLET | Freq: Two times a day (BID) | ORAL | Status: DC
  Administered 2023-10-26 – 2023-10-31 (×12): 50 mg via ORAL
  Filled 2023-10-26 (×14): qty 1

## 2023-10-26 MED ORDER — INSULIN ASPART 100 UNIT/ML IJ SOLN: 0.0000 [IU] | Freq: Every day | INTRAMUSCULAR | Status: DC

## 2023-10-26 MED ORDER — HYDRALAZINE HCL 50 MG PO TABS
75.0000 mg | ORAL_TABLET | Freq: Three times a day (TID) | ORAL | Status: DC
  Administered 2023-10-26 – 2023-10-30 (×12): 75 mg via ORAL
  Filled 2023-10-26 (×13): qty 1

## 2023-10-26 MED ORDER — PNEUMOCOCCAL 20-VAL CONJ VACC 0.5 ML IM SUSY
0.5000 mL | PREFILLED_SYRINGE | INTRAMUSCULAR | Status: AC
  Administered 2023-10-28: 0.5 mL via INTRAMUSCULAR
  Filled 2023-10-26: qty 0.5

## 2023-10-26 MED ORDER — INSULIN ASPART 100 UNIT/ML IJ SOLN
0.0000 [IU] | Freq: Three times a day (TID) | INTRAMUSCULAR | Status: DC
Start: 2023-10-26 — End: 2023-11-02

## 2023-10-26 MED ORDER — CHLORHEXIDINE GLUCONATE CLOTH 2 % EX PADS
6.0000 | MEDICATED_PAD | Freq: Every day | CUTANEOUS | Status: DC
  Administered 2023-10-26 – 2023-11-01 (×7): 6 via TOPICAL

## 2023-10-26 NOTE — Plan of Care (Signed)

## 2023-10-26 NOTE — Progress Notes (Signed)
 RT called to pt bedside due to pt stating worsening WOB. RT found pt resting and sleeping comfortably. RT woke pt and asked if she would like PRN tx and pt declined.

## 2023-10-26 NOTE — [place] FL2 (Addendum)
 [place]  MEDICAID FL2 LEVEL OF CARE FORM     IDENTIFICATION  Patient Name: Carolyn Russo Birthdate: 1950-12-28 Sex: female Admission Date (Current Location): 10/24/2023  [facility] and [place] Number:  Producer, Television/film/video and Address:  [facility] [place], [address], [place], [place] [address]      Provider Number: [id number]  Attending Physician Name and Address:  Vernon Ranks, MD  Relative Name and Phone Number:  Dr. [name] Raddle. [place] [phone]    Current Level of Care: Hospital Recommended Level of Care: Skilled Nursing Facility Prior Approval Number:    Date Approved/Denied:   PASRR Number: [id number]  Discharge Plan: SNF    Current Diagnoses: Patient Active Problem List   Diagnosis Date Noted   Elevated troponin 10/24/2023   Hypokalemia 10/24/2023   Memory loss 10/24/2023   History of seizure disorder 10/24/2023   GERD (gastroesophageal reflux disease) 10/24/2023   Primary insomnia 09/11/2023   Wheelchair dependence 06/09/2023   Hx of right BKA (HCC) 03/10/2023   CHF (congestive heart failure) (HCC) 02/10/2023   Dyspnea 02/09/2023   Acute respiratory distress 02/09/2023   Urinary tract infection without hematuria    Diabetes mellitus due to underlying condition with hypoglycemia without coma, with long-term current use of insulin  (HCC)    Seizure disorder (HCC) 06/22/2022   Acute respiratory failure (HCC)    Unresponsiveness    Pressure ulcer 06/07/2022   Acute stroke due to ischemia (HCC) 06/03/2022   Chest pain 06/01/2022   History of CVA with residual deficit 06/01/2022   Seizure (HCC)    Encephalopathy    Below-knee amputation of right lower extremity (HCC)    Leukocytosis    Foot infection 05/10/2022   PAD (peripheral artery disease) (HCC) 05/10/2022   Gangrene of right foot (HCC) 04/15/2022   Left hemiparesis (HCC) 09/24/2021   Abnormality of gait 09/24/2021   Pain of right hand 08/06/2021   Steal syndrome of dialysis  vascular access (HCC) 08/06/2021   Subclavian steal syndrome of right subclavian artery 06/25/2021   Right hand weakness 06/25/2021   Stroke-like symptoms 05/06/2021   Bandemia    Cerebrovascular accident (CVA) of right basal ganglia (HCC) 04/20/2021   Hemorrhoids    Basal ganglia infarction (HCC) 04/16/2021   ESRD on dialysis (HCC) 04/16/2021   Hypertensive urgency 03/10/2021   Hilar enlargement    Anemia of chronic disease    Chronic diastolic CHF (congestive heart failure) (HCC) 03/09/2021   CKD (chronic kidney disease), stage V (HCC) 03/08/2021   Diabetic retinopathy (HCC) 03/08/2021   Hyperglycemia due to type 2 diabetes mellitus (HCC) 03/08/2021   Pure hypercholesterolemia 03/08/2021   Anemia in chronic kidney disease 09/24/2020   Uncontrolled type 2 diabetes mellitus with hyperglycemia (HCC) 03/19/2019   Osteopenia 09/04/2018   Migraine 09/04/2018   Asthma 09/04/2018   Pseudophakia of both eyes 07/17/2018   Pseudophakia of left eye 07/03/2018   Open angle with borderline findings and high glaucoma risk in left eye 07/03/2018   Age-related nuclear cataract of right eye 07/03/2018   CAD (coronary artery disease), native coronary artery 04/27/2017   Abnormal nuclear stress test 04/04/2017   Shortness of breath 03/09/2017   Appendicitis    Age-related hypermature cataract of both eyes 12/20/2016   Uterine leiomyoma 11/27/2012   Dyslipidemia (high LDL; low HDL) 11/25/2011   Obesity (BMI 30-39.9) 11/25/2011   Hypertensive disorder 11/21/2011   Type 2 diabetes mellitus (HCC) 11/21/2011   Abnormal EKG 11/21/2011    Orientation  RESPIRATION BLADDER Height & Weight     Self, Place, Time  Normal Continent Weight: 175 lb 7.8 oz (79.6 kg) Height:  5' 3 (160 cm)  BEHAVIORAL SYMPTOMS/MOOD NEUROLOGICAL BOWEL NUTRITION STATUS      Continent (WDL) Diet (Please see discharge summary)  AMBULATORY STATUS COMMUNICATION OF NEEDS Skin   Limited Assist Verbally Other (Comment) (WDL,  Wound/Incision LDAs)                       Personal Care Assistance Level of Assistance  Bathing, Feeding, Dressing Bathing Assistance: Maximum assistance Feeding assistance: Limited assistance Dressing Assistance: Maximum assistance     Functional Limitations Info  Sight, Speech, Hearing Sight Info: Impaired Hearing Info: Impaired Speech Info: Adequate    SPECIAL CARE FACTORS FREQUENCY  PT (By licensed PT), OT (By licensed OT)     PT Frequency: 5x min weekly OT Frequency: 5x min weekly            Contractures Contractures Info: Not present    Additional Factors Info  Code Status, Allergies Code Status Info: FULL Allergies Info: Almond Oil,Peanuts - anaphylaxis Almonds - anaphylaxis,Wheat,Statins,Gadolinium Derivatives,Januvia  (sitagliptin ),Pork-derived Products,Shellfish Allergy,Tetracycline,Tetracycline Hcl           Current Medications (10/26/2023):  This is the current hospital active medication list Current Facility-Administered Medications  Medication Dose Route Frequency Provider Last Rate Last Admin   acetaminophen  (TYLENOL ) tablet 650 mg  650 mg Oral Q6H PRN Smith, Rondell A, MD   650 mg at 10/26/23 9078   Or   acetaminophen  (TYLENOL ) suppository 650 mg  650 mg Rectal Q6H PRN Claudene Reeves A, MD       albuterol  (PROVENTIL ) (2.5 MG/3ML) 0.083% nebulizer solution 2.5 mg  2.5 mg Nebulization Q6H PRN Smith, Rondell A, MD   2.5 mg at 10/25/23 2042   amitriptyline  (ELAVIL ) tablet 50 mg  50 mg Oral QHS Smith, Rondell A, MD   50 mg at 10/25/23 2104   aspirin  EC tablet 81 mg  81 mg Oral Daily Smith, Rondell A, MD   81 mg at 10/26/23 9077   calcitRIOL  (ROCALTROL ) capsule 0.5 mcg  0.5 mcg Oral Q M,W,F Smith, Rondell A, MD   0.5 mcg at 10/25/23 1147   Chlorhexidine  Gluconate Cloth 2 % PADS 6 each  6 each Topical Q0600 Collins, Samantha G, PA-C   6 each at 10/26/23 1030   cinacalcet  (SENSIPAR ) tablet 30 mg  30 mg Oral Q breakfast Smith, Rondell A, MD   30 mg at  10/26/23 9077   diclofenac  Sodium (VOLTAREN ) 1 % topical gel 2 g  2 g Topical QID Smith, Rondell A, MD   2 g at 10/26/23 9071   gabapentin  (NEURONTIN ) capsule 300 mg  300 mg Oral QHS Smith, Rondell A, MD   300 mg at 10/25/23 2058   heparin  injection 5,000 Units  5,000 Units Subcutaneous Q8H Smith, Rondell A, MD   5,000 Units at 10/26/23 0636   heparin  sodium (porcine) injection 3,800 Units  3,800 Units Intracatheter Once Rayburn Pac, MD       hydrALAZINE  (APRESOLINE ) injection 10 mg  10 mg Intravenous Q4H PRN Smith, Rondell A, MD   10 mg at 10/25/23 9687   hydrALAZINE  (APRESOLINE ) tablet 75 mg  75 mg Oral TID Pahwani, Ravi, MD   75 mg at 10/26/23 9077   insulin  aspart (novoLOG ) injection 0-5 Units  0-5 Units Subcutaneous QHS Vernon Ranks, MD       insulin  aspart (novoLOG ) injection 0-9  Units  0-9 Units Subcutaneous TID WC Vernon Ranks, MD       levETIRAcetam  (KEPPRA ) tablet 375 mg  375 mg Oral Q M,W,F-1800 Smith, Rondell A, MD       levETIRAcetam  (KEPPRA ) tablet 750 mg  750 mg Oral QHS Smith, Rondell A, MD   750 mg at 10/25/23 2100   loratadine  (CLARITIN ) tablet 10 mg  10 mg Oral Daily Smith, Rondell A, MD   10 mg at 10/26/23 9077   losartan  (COZAAR ) tablet 50 mg  50 mg Oral BID Pahwani, Ravi, MD   50 mg at 10/26/23 9077   melatonin tablet 5 mg  5 mg Oral QHS Smith, Rondell A, MD   5 mg at 10/25/23 2058   mometasone -formoterol  (DULERA ) 200-5 MCG/ACT inhaler 2 puff  2 puff Inhalation BID Claudene Reeves A, MD   2 puff at 10/26/23 9075   ondansetron  (ZOFRAN ) tablet 4 mg  4 mg Oral Q6H PRN Smith, Rondell A, MD       Or   ondansetron  (ZOFRAN ) injection 4 mg  4 mg Intravenous Q6H PRN Smith, Rondell A, MD       pantoprazole  (PROTONIX ) EC tablet 40 mg  40 mg Oral QHS Smith, Rondell A, MD   40 mg at 10/25/23 2059   rosuvastatin  (CRESTOR ) tablet 20 mg  20 mg Oral QHS Smith, Rondell A, MD   20 mg at 10/25/23 2059   sevelamer  carbonate (RENVELA ) packet 0.8 g  0.8 g Oral TID with meals Claudene, Rondell  A, MD   0.8 g at 10/26/23 1200   sodium chloride  flush (NS) 0.9 % injection 3 mL  3 mL Intravenous Q12H Smith, Rondell A, MD   3 mL at 10/25/23 2101   ticagrelor  (BRILINTA ) tablet 90 mg  90 mg Oral BID Smith, Rondell A, MD   90 mg at 10/26/23 9077     Discharge Medications: Please see discharge summary for a list of discharge medications.  Relevant Imaging Results:  Relevant Lab Results:   Additional Information  [id number], HD at [facility] on MWF Pt arrives at 6:50am for a 7:10am chair time.  Isaiah Public, LCSWA

## 2023-10-26 NOTE — Progress Notes (Signed)
 PROGRESS NOTE    Carolyn Russo  FMW:[id number] DOB: [birth year] DOA: 10/24/2023 PCP: Karlene Mungo, MD   Brief Narrative:  HPI: Carolyn Russo is a 73 y.o. female with medical history significant of ESRD on HD, CHF, CVA with residual left-sided weakness, right BKA, seizure disorder, diabetes mellitus type 2 who presents  with sudden onset of severe chest and back pain on the left side during her dialysis session this morning.  Most of history is obtained from the patient's husband who is present at bedside.  The pain was accompanied by severe nausea, but no vomiting or sweating was reported. The patient had been attending dialysis sessions regularly, three times a week, and had not missed any recent sessions.  Patient does report having some shortness of breath at current time.  Denies having any significant cough.   In the past few weeks, the patient has been experiencing increased agitation, difficulty sleeping, and elevated blood pressure. The patient's spouse reported that these symptoms began after the cessation of a beta blocker medication due to concerns about slow pulses and pauses in the heart rate.   The patient also reported a history of amputation, and has been experiencing increased difficulty with mobility and associated pain. The patient's spouse suggested that this could be due to a lack of exercise and poor posture.   The patient has a history of diabetes, which has been well-managed. The patient also reported to have undergone  recent mammogram, after which seem to onset her complaints of chest pain. A subsequent chest x-ray was negative.   The patient's spouse also reported concerns about possible dementia, as the patient has been repeating herself and seeming not to understand instructions. The patient requested a urinary tract infection test, as she still produces urine.    Upon admission into the emergency department patient was noted to be afebrile with blood pressures  elevated at 177/107, and O2 saturations currently maintained on room air.  Labs significant for hemoglobin 10, platelets 442, sodium 132, potassium 3.3, BUN 19, creatinine 4.01, and high-sensitivity troponin 66.  Chest x-ray normal unchanged from prior imaging from 02/12/23 mild pulmonary vascular congestion.  Patient had been given fentanyl  50 mcg IV and full dose aspirin   Assessment & Plan:   Principal Problem:   Chest pain Active Problems:   Elevated troponin   ESRD on dialysis (HCC)   Hypertensive urgency   Hypokalemia   Type 2 diabetes mellitus (HCC)   History of CVA with residual deficit   Memory loss   Primary insomnia   PAD (peripheral artery disease) (HCC)   Seizure disorder (HCC)   Dyslipidemia (high LDL; low HDL)   GERD (gastroesophageal reflux disease)  Chest pain / Elevated troponin, POA: Confirmed with the husband, patient has been complaining of chest pain as well as shoulder pain at times recently but intermittently.  Patient still complains of chest pain.  On my examination, she has point tenderness to the anterior chest suggesting possible musculoskeletal cause of the pain.  Troponins are only slightly elevated and flat, consistent with only demand ischemia in the setting of ESRD.  ACS ruled out.     ESRD on HD Typical dialysis days are Monday Wednesday Friday but has been changed due to holidays, per husband.  She received 2 hours of dialysis Tuesday instead of 3 hours due to chest pain.  Nephrology consulted, she had short duration of dialysis again on 10/25/2023, next dialysis tomorrow.   Hypertensive urgency On admission blood pressures were  noted to be elevated up to 177/107.  Patient's husband notes that blood pressures have been elevated ever since Coreg  was discontinued due to bradycardia. Per husband, patient on losartan  50 mg twice daily and hydralazine  50 mg twice daily.  However patient was started on losartan  50 once daily.  Blood pressure still elevated today.   Discussed with the has been, increased losartan  back to 50 twice daily and increasing hydralazine  to 75 mg 3 times daily.  Noted that nephrology recommends resuming low-dose of Coreg  however Coreg  was discontinued due to prolonged pauses up to 13 seconds so it will be dangerous to resume beta-blockers.  We still have room to go up on hydralazine  if needed.   Hypokalemia Replenished and resolved.   Controlled diabetes mellitus type 2, with long-term use of insulin  On admission glucose noted to be 126.  At home patient on a very sensitive SSI. -Hypoglycemic protocols -CBGs before every meal with very sensitive SSI -Adjust regimen as needed.   History of CVA with residual deficit Following prior stroke patient is residual left-sided deficits.  Patient finished outpatient PT recently, has been requesting PT OT evaluation here.  I have ordered that.   Memory loss Patient has been noticed that over the last couple of weeks her memory has progressively declined.  She has been repeating things and often getting agitated at night.  He has concerns for dementia, but she has not been formally tested. -Delirium precautions.  Patient currently fully alert and oriented. -May warrant Seroquel  overnight if noted to have significant agitation -Recommend outpatient referral to neurology   Insomnia Patient's husband notes that she previously had been on trazodone  for issues with insomnia at night, but was recently switched to amitriptyline . -Continue amitriptyline .  Consider increasing dose -Melatonin added nightly.   Peripheral vascular disease Status post right BKA   History of seizures -Continue Keppra    Dyslipidemia -Continue Crestor    GERD -Continue Protonix    Obesity BMI 31.24 kg/m  Generalized weakness/deconditioning: Assessed by PT OT per husband's request.  They are recommending SNF.  TOC has discussed with husband, he prefers for the patient to have another session with PT tomorrow  and if they still recommend SNF, he will be amenable to that.  DVT prophylaxis: heparin  injection 5,000 Units Start: 10/24/23 2200 SCDs Start: 10/24/23 1538   Code Status: Full Code  Family Communication: None present at bedside.  Plan of care discussed with patient in length and he/she verbalized understanding and agreed with it.  Status is: Observation The patient will require care spanning > 2 midnights and should be moved to inpatient because: Getting dialysis today and blood pressure still elevated needs adjustment of medications.   Estimated body mass index is 31.09 kg/m as calculated from the following:   Height as of this encounter: 5' 3 (1.6 m).   Weight as of this encounter: 79.6 kg.    Nutritional Assessment: Body mass index is 31.09 kg/m.Carolyn Russo Seen by dietician.  I agree with the assessment and plan as outlined below: Nutrition Status:        . Skin Assessment: I have examined the patient's skin and I agree with the wound assessment as performed by the wound care RN as outlined below:    Consultants:  Nephrology  Procedures:  None  Antimicrobials:  Anti-infectives (From admission, onward)    None         Subjective: Seen and examined.  Patient alert and oriented.  She has no complaints.  Objective: Vitals:  10/26/23 0355 10/26/23 0727 10/26/23 1038 10/26/23 1410  BP: (!) 180/86 (!) 162/90 (!) 145/68 (!) 154/75  Pulse: 79 74 77 76  Resp: 18 18 18 17   Temp: 97.9 F (36.6 C) 98.3 F (36.8 C) 97.8 F (36.6 C)   TempSrc: Oral Axillary Oral   SpO2: 100% 100% 100% 96%  Weight: 79.6 kg     Height:        Intake/Output Summary (Last 24 hours) at 10/26/2023 1416 Last data filed at 10/26/2023 1329 Gross per 24 hour  Intake 360 ml  Output 2100 ml  Net -1740 ml   Filed Weights   10/24/23 1730 10/25/23 1444 10/26/23 0355  Weight: 80 kg 79.8 kg 79.6 kg    Examination:  General exam: Appears calm and comfortable  Respiratory system: Faint rales at  the bases bilaterally. Respiratory effort normal. Cardiovascular system: S1 & S2 heard, RRR. No JVD, murmurs, rubs, gallops or clicks. No pedal edema. Gastrointestinal system: Abdomen is nondistended, soft and nontender. No organomegaly or masses felt. Normal bowel sounds heard. Central nervous system: Alert and oriented.  Left known hemiparesis from previous strokes. Extremities: Right BKA Psychiatry: Judgement and insight appear normal. Mood & affect appropriate.    Data Reviewed: I have personally reviewed following labs and imaging studies  CBC: Recent Labs  Lab 10/24/23 1220 10/25/23 0521  WBC 7.9 7.6  HGB 10.0* 9.8*  HCT 28.9* 28.4*  MCV 83.0 82.8  PLT 442* 400   Basic Metabolic Panel: Recent Labs  Lab 10/24/23 1125 10/25/23 0521 10/26/23 0414  NA 132* 132* 132*  K 3.3* 3.9 3.5  CL 94* 94* 94*  CO2 26 25 27   GLUCOSE 126* 88 68*  BUN 19 24* 18  CREATININE 4.01* 5.01* 4.57*  CALCIUM  8.2* 8.5* 8.1*  PHOS  --  3.9 4.1   GFR: Estimated Creatinine Clearance: 10.8 mL/min (A) (by C-G formula based on SCr of 4.57 mg/dL (H)). Liver Function Tests: Recent Labs  Lab 10/24/23 1125 10/25/23 0521 10/26/23 0414  AST 18  --   --   ALT 14  --   --   ALKPHOS 123  --   --   BILITOT 0.4  --   --   PROT 7.5  --   --   ALBUMIN  3.1* 2.9* 2.8*   No results for input(s): LIPASE, AMYLASE in the last 168 hours. No results for input(s): AMMONIA in the last 168 hours. Coagulation Profile: No results for input(s): INR, PROTIME in the last 168 hours. Cardiac Enzymes: Recent Labs  Lab 10/24/23 1743  CKTOTAL 100   BNP (last 3 results) No results for input(s): PROBNP in the last 8760 hours. HbA1C: No results for input(s): HGBA1C in the last 72 hours. CBG: Recent Labs  Lab 10/24/23 1742 10/24/23 2106 10/25/23 0848 10/25/23 1220 10/26/23 1205  GLUCAP 111* 122* 91 99 104*   Lipid Profile: No results for input(s): CHOL, HDL, LDLCALC, TRIG, CHOLHDL,  LDLDIRECT in the last 72 hours. Thyroid  Function Tests: No results for input(s): TSH, T4TOTAL, FREET4, T3FREE, THYROIDAB in the last 72 hours. Anemia Panel: No results for input(s): VITAMINB12, FOLATE, FERRITIN, TIBC, IRON , RETICCTPCT in the last 72 hours. Sepsis Labs: No results for input(s): PROCALCITON, LATICACIDVEN in the last 168 hours.  No results found for this or any previous visit (from the past 240 hours).   Radiology Studies: ECHOCARDIOGRAM COMPLETE Result Date: 10/25/2023    ECHOCARDIOGRAM REPORT   Patient Name:   KAMESHA HERNE [facility] Date of Exam: 10/25/2023  Medical Rec #:  [id number]      Height:       63.0 in Accession #:    [id number]     Weight:       176.4 lb Date of Birth:  01-14-1951      BSA:          1.833 m Patient Age:    72 years       BP:           164/79 mmHg Patient Gender: F              HR:           89 bpm. Exam Location:  Inpatient Procedure: 2D Echo, Color Doppler, Cardiac Doppler and Intracardiac            Opacification Agent Indications:    R07.9* Chest pain, unspecified  History:        Patient has prior history of Echocardiogram examinations, most                 recent 06/04/2022. CAD, PAD and ESRD; Risk Factors:Hypertension,                 Diabetes and Dyslipidemia.  Sonographer:    Damien Senior RDCS Referring Phys: [phone] RONDELL A SMITH IMPRESSIONS  1. Left ventricular ejection fraction, by estimation, is 55 to 60%. The left ventricle has normal function. The left ventricle has no regional wall motion abnormalities. There is moderate left ventricular hypertrophy. Left ventricular diastolic parameters are indeterminate. Elevated left atrial pressure.  2. Right ventricular systolic function is normal. The right ventricular size is normal. There is mildly elevated pulmonary artery systolic pressure.  3. The mitral valve is normal in structure. No evidence of mitral valve regurgitation. No evidence of mitral stenosis.  4. The tricuspid valve is  abnormal.  5. The aortic valve is tricuspid. There is mild calcification of the aortic valve. There is mild thickening of the aortic valve. Aortic valve regurgitation is not visualized. No aortic stenosis is present. FINDINGS  Left Ventricle: Left ventricular ejection fraction, by estimation, is 55 to 60%. The left ventricle has normal function. The left ventricle has no regional wall motion abnormalities. Definity  contrast agent was given IV to delineate the left ventricular  endocardial borders. The left ventricular internal cavity size was normal in size. There is moderate left ventricular hypertrophy. Left ventricular diastolic parameters are indeterminate. Elevated left atrial pressure. Right Ventricle: The right ventricular size is normal. Right vetricular wall thickness was not well visualized. Right ventricular systolic function is normal. There is mildly elevated pulmonary artery systolic pressure. The tricuspid regurgitant velocity  is 2.53 m/s, and with an assumed right atrial pressure of 15 mmHg, the estimated right ventricular systolic pressure is 40.6 mmHg. Left Atrium: Left atrial size was normal in size. Right Atrium: Right atrial size was normal in size. Pericardium: There is no evidence of pericardial effusion. Mitral Valve: The mitral valve is normal in structure. No evidence of mitral valve regurgitation. No evidence of mitral valve stenosis. Tricuspid Valve: The tricuspid valve is abnormal. Tricuspid valve regurgitation is mild . No evidence of tricuspid stenosis. Aortic Valve: The aortic valve is tricuspid. There is mild calcification of the aortic valve. There is mild thickening of the aortic valve. There is mild aortic valve annular calcification. Aortic valve regurgitation is not visualized. No aortic stenosis  is present. Aortic valve mean gradient measures 6.8 mmHg. Aortic valve peak gradient measures 14.1 mmHg. Aortic valve  area, by VTI measures 1.69 cm. Pulmonic Valve: The pulmonic  valve was not well visualized. Pulmonic valve regurgitation is mild. No evidence of pulmonic stenosis. Aorta: The aortic root is normal in size and structure. Venous: The inferior vena cava was not well visualized. IAS/Shunts: No atrial level shunt detected by color flow Doppler.  LEFT VENTRICLE PLAX 2D LVIDd:         4.30 cm   Diastology LVIDs:         2.90 cm   LV e' medial:    4.90 cm/s LV PW:         1.30 cm   LV E/e' medial:  18.7 LV IVS:        1.30 cm   LV e' lateral:   6.20 cm/s LVOT diam:     2.10 cm   LV E/e' lateral: 14.8 LV SV:         59 LV SV Index:   32 LVOT Area:     3.46 cm  RIGHT VENTRICLE RV S prime:     9.90 cm/s TAPSE (M-mode): 1.5 cm LEFT ATRIUM             Index LA diam:        4.10 cm 2.24 cm/m LA Vol (A2C):   51.3 ml 27.99 ml/m LA Vol (A4C):   49.3 ml 26.90 ml/m LA Biplane Vol: 53.4 ml 29.13 ml/m  AORTIC VALVE AV Area (Vmax):    1.60 cm AV Area (Vmean):   1.69 cm AV Area (VTI):     1.69 cm AV Vmax:           187.93 cm/s AV Vmean:          120.975 cm/s AV VTI:            0.349 m AV Peak Grad:      14.1 mmHg AV Mean Grad:      6.8 mmHg LVOT Vmax:         86.65 cm/s LVOT Vmean:        59.147 cm/s LVOT VTI:          0.171 m LVOT/AV VTI ratio: 0.49  AORTA Ao Root diam: 2.60 cm Ao Asc diam:  2.90 cm MITRAL VALVE               TRICUSPID VALVE MV Area (PHT): 4.46 cm    TR Peak grad:   25.6 mmHg MV Decel Time: 170 msec    TR Vmax:        253.00 cm/s MV E velocity: 91.70 cm/s MV A velocity: 81.40 cm/s  SHUNTS MV E/A ratio:  1.13        Systemic VTI:  0.17 m                            Systemic Diam: 2.10 cm Dorn Ross MD Electronically signed by Dorn Ross MD Signature Date/Time: 10/25/2023/2:32:16 PM    Final     Scheduled Meds:  amitriptyline   50 mg Oral QHS   aspirin  EC  81 mg Oral Daily   calcitRIOL   0.5 mcg Oral Q M,W,F   Chlorhexidine  Gluconate Cloth  6 each Topical Q0600   cinacalcet   30 mg Oral Q breakfast   diclofenac  Sodium  2 g Topical QID   gabapentin   300 mg Oral  QHS   heparin  injection (subcutaneous)  5,000 Units Subcutaneous Q8H   heparin  sodium (porcine)  3,800 Units Intracatheter  Once   hydrALAZINE   75 mg Oral TID   insulin  aspart  0-5 Units Subcutaneous QHS   insulin  aspart  0-9 Units Subcutaneous TID WC   levETIRAcetam   375 mg Oral Q M,W,F-1800   levETIRAcetam   750 mg Oral QHS   loratadine   10 mg Oral Daily   losartan   50 mg Oral BID   melatonin  5 mg Oral QHS   mometasone -formoterol   2 puff Inhalation BID   pantoprazole   40 mg Oral QHS   rosuvastatin   20 mg Oral QHS   sevelamer  carbonate  0.8 g Oral TID with meals   sodium chloride  flush  3 mL Intravenous Q12H   ticagrelor   90 mg Oral BID   Continuous Infusions:   LOS: 0 days   Fredia Skeeter, MD [facility]  10/26/2023, 2:16 PM   *Please note that this is a verbal dictation therefore any spelling or grammatical errors are due to the Dragon Medical One system interpretation.  Please page via Amion and do not message via secure chat for urgent patient care matters. Secure chat can be used for non urgent patient care matters.  How to contact the [facility] Attending or Consulting provider 7A - 7P or covering provider during after hours 7P -7A, for this patient?  Check the care team in [facility] and look for a) attending/consulting [facility] provider listed and b) the [facility] team listed. Page or secure chat 7A-7P. Log into [email or web address] and use [facility]'s universal password to access. If you do not have the password, please contact the hospital operator. Locate the [facility] provider you are looking for under [facility] and page to a number that you can be directly reached. If you still have difficulty reaching the provider, please page the [facility] (Director on Call) for the Hospitalists listed on amion for assistance.

## 2023-10-26 NOTE — Progress Notes (Signed)
 Mobility Specialist Progress Note:    10/26/23 1100  Mobility  Activity Transferred to/from [facility];Transferred from bed to chair  Level of Assistance +2 (takes two people)  Location Manager Ambulated (ft) 12 ft (4' x3)  Activity Response Tolerated well  Mobility Referral Yes  Mobility visit 1 Mobility  Mobility Specialist Start Time (ACUTE ONLY) 1052  Mobility Specialist Stop Time (ACUTE ONLY) 1119  Mobility Specialist Time Calculation (min) (ACUTE ONLY) 27 min   Pt received on EOB w/ NT. Assisted in donning prosthesis. Pt able to stand and pivot to chair w/ modA+2. Pt then transferred to [facility] d/t need to urinate. Void successful, w/ NT assisting w/ peri care. Transferred back to chair. Pt left in chair w/ NT and chair alarm on.  D'Vante Nicholaus Mobility Specialist Please contact via Special Educational Needs Teacher or Rehab office at [phone]

## 2023-10-26 NOTE — Progress Notes (Addendum)
 Pt receives out-pt HD at [facility] [place] on MWF 7:10 am chair time. This info provided to CSW for d/c planning purposes. Will assist as needed.  Olivia Canter Renal Navigator [phone]

## 2023-10-26 NOTE — Progress Notes (Signed)
 [facility] Progress Note   Subjective:   Patient had short HD yesterday, tolerated well. Sleeping, opens eyes to voice and denies CP, SOB, then goes back to sleep.   Objective Vitals:   10/25/23 1926 10/25/23 2354 10/26/23 0355 10/26/23 0727  BP: (!) 180/95 (!) 163/116 (!) 180/86 (!) 162/90  Pulse: 80 80 79 74  Resp: 16 20 18 18   Temp: 98 F (36.7 C) 98.8 F (37.1 C) 97.9 F (36.6 C) 98.3 F (36.8 C)  TempSrc: Oral Oral Oral Axillary  SpO2: 98% 100% 100% 100%  Weight:   79.6 kg   Height:       Physical Exam General: Sleeping female in NAD Heart: RRR, no murmurs, rubs or gallops Lungs: CTA bilaterally, on O2 via [place], respirations unlabored Abdomen: Soft, non-distended, +BS Extremities: No edema b/l lower extremities Dialysis Access: R internal jugular [facility]  Additional Objective Labs: Basic Metabolic Panel: Recent Labs  Lab 10/24/23 1125 10/25/23 0521 10/26/23 0414  NA 132* 132* 132*  K 3.3* 3.9 3.5  CL 94* 94* 94*  CO2 26 25 27   GLUCOSE 126* 88 68*  BUN 19 24* 18  CREATININE 4.01* 5.01* 4.57*  CALCIUM  8.2* 8.5* 8.1*  PHOS  --  3.9 4.1   Liver Function Tests: Recent Labs  Lab 10/24/23 1125 10/25/23 0521 10/26/23 0414  AST 18  --   --   ALT 14  --   --   ALKPHOS 123  --   --   BILITOT 0.4  --   --   PROT 7.5  --   --   ALBUMIN  3.1* 2.9* 2.8*   No results for input(s): LIPASE, AMYLASE in the last 168 hours. CBC: Recent Labs  Lab 10/24/23 1220 10/25/23 0521  WBC 7.9 7.6  HGB 10.0* 9.8*  HCT 28.9* 28.4*  MCV 83.0 82.8  PLT 442* 400   Blood Culture    Component Value Date/Time   SDES BLOOD RIGHT HAND 06/22/2022 1332   SPECREQUEST  06/22/2022 1332    AEROBIC BOTTLE ONLY Blood Culture results may not be optimal due to an inadequate volume of blood received in culture bottles   CULT  06/22/2022 1332    NO GROWTH 5 DAYS Performed at [facility], [address], [place], [place] [address]    REPTSTATUS 06/27/2022 FINAL  06/22/2022 1332    Cardiac Enzymes: Recent Labs  Lab 10/24/23 1743  CKTOTAL 100   CBG: Recent Labs  Lab 10/24/23 1742 10/24/23 2106 10/25/23 0848 10/25/23 1220  GLUCAP 111* 122* 91 99   Iron  Studies: No results for input(s): IRON , TIBC, TRANSFERRIN, FERRITIN in the last 72 hours. @lablastinr3 @ Studies/Results: ECHOCARDIOGRAM COMPLETE Result Date: 10/25/2023    ECHOCARDIOGRAM REPORT   Patient Name:   Carolyn [facility]so [facility] Date of Exam: 10/25/2023 Medical Rec #:  [id number]      Height:       63.0 in Accession #:    [id number]     Weight:       176.4 lb Date of Birth:  05/11/1951      BSA:          1.833 m Patient Age:    72 years       BP:           164/79 mmHg Patient Gender: F              HR:           89 bpm. Exam Location:  Inpatient Procedure: 2D Echo, Color Doppler, Cardiac Doppler and Intracardiac            Opacification Agent Indications:    R07.9* Chest pain, unspecified  History:        Patient has prior history of Echocardiogram examinations, most                 recent 06/04/2022. CAD, PAD and ESRD; Risk Factors:Hypertension,                 Diabetes and Dyslipidemia.  Sonographer:    Damien Senior RDCS Referring Phys: [phone] RONDELL A SMITH IMPRESSIONS  1. Left ventricular ejection fraction, by estimation, is 55 to 60%. The left ventricle has normal function. The left ventricle has no regional wall motion abnormalities. There is moderate left ventricular hypertrophy. Left ventricular diastolic parameters are indeterminate. Elevated left atrial pressure.  2. Right ventricular systolic function is normal. The right ventricular size is normal. There is mildly elevated pulmonary artery systolic pressure.  3. The mitral valve is normal in structure. No evidence of mitral valve regurgitation. No evidence of mitral stenosis.  4. The tricuspid valve is abnormal.  5. The aortic valve is tricuspid. There is mild calcification of the aortic valve. There is mild thickening of the aortic valve.  Aortic valve regurgitation is not visualized. No aortic stenosis is present. FINDINGS  Left Ventricle: Left ventricular ejection fraction, by estimation, is 55 to 60%. The left ventricle has normal function. The left ventricle has no regional wall motion abnormalities. Definity  contrast agent was given IV to delineate the left ventricular  endocardial borders. The left ventricular internal cavity size was normal in size. There is moderate left ventricular hypertrophy. Left ventricular diastolic parameters are indeterminate. Elevated left atrial pressure. Right Ventricle: The right ventricular size is normal. Right vetricular wall thickness was not well visualized. Right ventricular systolic function is normal. There is mildly elevated pulmonary artery systolic pressure. The tricuspid regurgitant velocity  is 2.53 m/s, and with an assumed right atrial pressure of 15 mmHg, the estimated right ventricular systolic pressure is 40.6 mmHg. Left Atrium: Left atrial size was normal in size. Right Atrium: Right atrial size was normal in size. Pericardium: There is no evidence of pericardial effusion. Mitral Valve: The mitral valve is normal in structure. No evidence of mitral valve regurgitation. No evidence of mitral valve stenosis. Tricuspid Valve: The tricuspid valve is abnormal. Tricuspid valve regurgitation is mild . No evidence of tricuspid stenosis. Aortic Valve: The aortic valve is tricuspid. There is mild calcification of the aortic valve. There is mild thickening of the aortic valve. There is mild aortic valve annular calcification. Aortic valve regurgitation is not visualized. No aortic stenosis  is present. Aortic valve mean gradient measures 6.8 mmHg. Aortic valve peak gradient measures 14.1 mmHg. Aortic valve area, by VTI measures 1.69 cm. Pulmonic Valve: The pulmonic valve was not well visualized. Pulmonic valve regurgitation is mild. No evidence of pulmonic stenosis. Aorta: The aortic root is normal in size  and structure. Venous: The inferior vena cava was not well visualized. IAS/Shunts: No atrial level shunt detected by color flow Doppler.  LEFT VENTRICLE PLAX 2D LVIDd:         4.30 cm   Diastology LVIDs:         2.90 cm   LV e' medial:    4.90 cm/s LV PW:         1.30 cm   LV E/e' medial:  18.7 LV IVS:  1.30 cm   LV e' lateral:   6.20 cm/s LVOT diam:     2.10 cm   LV E/e' lateral: 14.8 LV SV:         59 LV SV Index:   32 LVOT Area:     3.46 cm  RIGHT VENTRICLE RV S prime:     9.90 cm/s TAPSE (M-mode): 1.5 cm LEFT ATRIUM             Index LA diam:        4.10 cm 2.24 cm/m LA Vol (A2C):   51.3 ml 27.99 ml/m LA Vol (A4C):   49.3 ml 26.90 ml/m LA Biplane Vol: 53.4 ml 29.13 ml/m  AORTIC VALVE AV Area (Vmax):    1.60 cm AV Area (Vmean):   1.69 cm AV Area (VTI):     1.69 cm AV Vmax:           187.93 cm/s AV Vmean:          120.975 cm/s AV VTI:            0.349 m AV Peak Grad:      14.1 mmHg AV Mean Grad:      6.8 mmHg LVOT Vmax:         86.65 cm/s LVOT Vmean:        59.147 cm/s LVOT VTI:          0.171 m LVOT/AV VTI ratio: 0.49  AORTA Ao Root diam: 2.60 cm Ao Asc diam:  2.90 cm MITRAL VALVE               TRICUSPID VALVE MV Area (PHT): 4.46 cm    TR Peak grad:   25.6 mmHg MV Decel Time: 170 msec    TR Vmax:        253.00 cm/s MV E velocity: 91.70 cm/s MV A velocity: 81.40 cm/s  SHUNTS MV E/A ratio:  1.13        Systemic VTI:  0.17 m                            Systemic Diam: 2.10 cm Dorn Ross MD Electronically signed by Dorn Ross MD Signature Date/Time: 10/25/2023/2:32:16 PM    Final    CT Angio Chest/Abd/Pel for Dissection W and/or Wo Contrast Result Date: 10/24/2023 CLINICAL DATA:  Two-week history of left chest and shoulder pain radiating into the back. Hypertension. EXAM: CT ANGIOGRAPHY CHEST, ABDOMEN AND PELVIS TECHNIQUE: Non-contrast CT of the chest was initially obtained. Multidetector CT imaging through the chest, abdomen and pelvis was performed using the standard protocol during bolus  administration of intravenous contrast. Multiplanar reconstructed images and MIPs were obtained and reviewed to evaluate the vascular anatomy. RADIATION DOSE REDUCTION: This exam was performed according to the departmental dose-optimization program which includes automated exposure control, adjustment of the mA and/or kV according to patient size and/or use of iterative reconstruction technique. CONTRAST:  80mL OMNIPAQUE  IOHEXOL  350 MG/ML SOLN COMPARISON:  Same day chest radiograph, CT chest, abdomen, and pelvis dated 05/22/2022 FINDINGS: CTA CHEST FINDINGS Cardiovascular: Right IJ central venous catheter tip terminates in the right atrium. Preferential opacification of the thoracic aorta. No evidence of thoracic aortic aneurysm or dissection. Mild multichamber cardiomegaly. No pericardial effusion. Coronary artery calcifications. Mediastinum/Nodes: Imaged thyroid  gland without nodules meeting criteria for imaging follow-up by size. Patulous esophagus. Right hilar lymph node measures 11 mm. Lungs/Pleura: The central airways are patent. No focal consolidation. Subsegmental lingular  and left lower lobe atelectasis. No pneumothorax. No pleural effusion. Musculoskeletal: No acute or abnormal lytic or blastic osseous lesions. Multilevel degenerative changes of the thoracic spine. Review of the MIP images confirms the above findings. CTA ABDOMEN AND PELVIS FINDINGS VASCULAR Aorta: Aortic atherosclerosis. Normal caliber aorta without aneurysm, dissection, vasculitis or significant stenosis. Celiac: Patent without evidence of aneurysm, dissection, vasculitis or significant stenosis. SMA: Patent without evidence of aneurysm, dissection, vasculitis or significant stenosis. Renals: Moderate narrowing of the right renal artery origin due to atherosclerotic plaque. No evidence of aneurysm, dissection, vasculitis, or fibromuscular dysplasia. IMA: Patent without evidence of aneurysm, dissection, vasculitis or significant stenosis.  Inflow: Patent without evidence of aneurysm, dissection, vasculitis or significant stenosis. Proximal Outflow: Bilateral common femoral and visualized portions of the superficial and profunda femoral arteries are patent without evidence of aneurysm, dissection, vasculitis or significant stenosis. Veins: No obvious venous abnormality within the limitations of this arterial phase study. Review of the MIP images confirms the above findings. NON-VASCULAR Hepatobiliary: No focal hepatic lesions. No intra or extrahepatic biliary ductal dilation. Mildly distended gallbladder. Pancreas: No focal lesions or main ductal dilation. Spleen: Normal in size without focal abnormality. Adrenals/Urinary Tract: No adrenal nodules. No suspicious renal mass, calculi or hydronephrosis. No focal bladder wall thickening. Stomach/Bowel: Normal appearance of the stomach. No evidence of bowel wall thickening, distention, or inflammatory changes. Colonic diverticulosis without acute diverticulitis. Appendix is not discretely seen. Lymphatic: No enlarged abdominal or pelvic lymph nodes. Reproductive: Scattered coarse calcifications within the uterus, likely leiomyomata. No adnexal masses. Other: Small volume presacral edema. No free air or fluid collection. Musculoskeletal: No acute or abnormal lytic or blastic osseous lesions. Multilevel degenerative changes of the lumbar spine. Small fat-containing paraumbilical and right inguinal hernias. Review of the MIP images confirms the above findings. IMPRESSION: 1. No evidence of aortic aneurysm or dissection. 2. Mildly enlarged right hilar lymph node, nonspecific. 3. Otherwise, no acute intrathoracic, intra-abdominal, or intrapelvic abnormality. 4. Mildly distended gallbladder, nonspecific. Recommend correlation with right upper quadrant tenderness. 5. Colonic diverticulosis without acute diverticulitis. 6. Aortic Atherosclerosis (ICD10-I70.0). Coronary artery calcifications. Assessment for  potential risk factor modification, dietary therapy or pharmacologic therapy may be warranted, if clinically indicated. Electronically Signed   By: Limin  Xu M.D.   On: 10/24/2023 16:20   DG Chest 2 View Result Date: 10/24/2023 CLINICAL DATA:  Left shoulder and chest pain. EXAM: CHEST - 2 VIEW COMPARISON:  02/12/2023 FINDINGS: Right IJ dialysis catheter tip in the right atrium. Stable cardiomediastinal silhouette. Aortic atherosclerotic calcification. Pulmonary vascular congestion. No focal consolidation, pleural effusion, or pneumothorax. No displaced rib fractures. IMPRESSION: No change from 02/12/2023.  Mild pulmonary vascular congestion. Electronically Signed   By: Norman Gatlin M.D.   On: 10/24/2023 13:40   Medications:   amitriptyline   50 mg Oral QHS   aspirin  EC  81 mg Oral Daily   calcitRIOL   0.5 mcg Oral Q M,W,F   Chlorhexidine  Gluconate Cloth  6 each Topical Daily   cinacalcet   30 mg Oral Q breakfast   diclofenac  Sodium  2 g Topical QID   gabapentin   300 mg Oral QHS   heparin  injection (subcutaneous)  5,000 Units Subcutaneous Q8H   heparin  sodium (porcine)  3,800 Units Intracatheter Once   hydrALAZINE   75 mg Oral TID   levETIRAcetam   375 mg Oral Q M,W,F-1800   levETIRAcetam   750 mg Oral QHS   loratadine   10 mg Oral Daily   losartan   50 mg Oral BID   melatonin  5 mg Oral QHS   mometasone -formoterol   2 puff Inhalation BID   pantoprazole   40 mg Oral QHS   rosuvastatin   20 mg Oral QHS   sevelamer  carbonate  0.8 g Oral TID with meals   sodium chloride  flush  3 mL Intravenous Q12H   ticagrelor   90 mg Oral BID    Dialysis Orders: Center: [facility]   4h 350/1.5 EDW 78.9 kg  2/2 bath RIJ TDC  Heparin  2600 -profile 2 - last HD 12/31, post wt 80.4 kg   - hectorol  IV 4 mcg tiw - venofer  100mg  tiw thru 4/24 - mircera 200 mcg IV q 2 wks, due 10/27/23  Assessment/Plan:  Chest pain - troponins flat, likely demand ischemia.  Likely musculoskeletal source of pain given reproduction with  palpation.  ACS ruled out. Denies CP this AM  ESRD -  had short HD yesterday due to abbreviated treatment Monday. No acute indications for HD today. Next HD tomorrow.   Hypertension/volume  -BP elevated upon admission. Not to EDW, continue UF with HD as tolerated. Carvedilol  recently stopped due to bradycardia.  Would consider restarting at a lower dose while she remains an inpatient.  Anemia  - Hgb 9.8, due to micera on Friday.  Metabolic bone disease -  Calcium  and phos controlled, continue with home meds  Nutrition - renal diet, carb modified  Diabetes Mellitus type 2 - per primary svc   Carolyn Collet, Carolyn Russo 10/26/2023, 9:20 AM  [facility] Pager: [phone]

## 2023-10-26 NOTE — TOC Progression Note (Signed)
 Transition of Care [facility]) - Progression Note    Patient Details  Name: Carolyn Russo MRN: [id number] Date of Birth: [birth year]  [facility] CM/SW Contact  Isaiah Public, LCSWA Phone Number: 10/26/2023, 12:12 PM  Clinical Narrative:     CSW received consult for possible SNF placement at time of discharge.Due to patients current orientation CSW spoke with patients spouse  regarding PT recommendation of SNF placement for patient at time of discharge.  Patients spouse expressed understanding of PT recommendation and is agreeable for CSW to fax patient out for possible SNF placement at time of discharge. Patients spouse would like to see how patient does with PT tomorrow before making final decision on discharge plan. CSW discussed insurance authorization process. No further questions reported at this time. CSW to continue to follow and assist with discharge planning needs.   Expected Discharge Plan:  (TBD SNF vrs. going home with hh services) Barriers to Discharge: Continued Medical Work up  Expected Discharge Plan and Services In-house Referral: Clinical Social Work Discharge Planning Services: CM Consult Post Acute Care Choice: Home Health Living arrangements for the past 2 months: Single Family Home                                       Social Determinants of Health (SDOH) Interventions SDOH Screenings   Food Insecurity: No Food Insecurity (10/25/2023)  Housing: Unknown (10/25/2023)  Transportation Needs: No Transportation Needs (10/25/2023)  Utilities: Not At Risk (10/25/2023)  Alcohol Screen: Low Risk  (01/09/2019)  Depression (PHQ2-9): Low Risk  (09/11/2023)  Financial Resource Strain: Low Risk  (09/02/2021)  Physical Activity: Inactive (09/02/2021)  Social Connections: Socially Integrated (10/25/2023)  Stress: Stress Concern Present (09/02/2021)  Tobacco Use: Low Risk  (10/25/2023)    Readmission Risk Interventions    02/14/2023    4:54 PM  Readmission Risk  Prevention Plan  Transportation Screening Complete  PCP or Specialist Appt within 3-5 Days Complete  HRI or Home Care Consult Complete  Social Work Consult for Recovery Care Planning/Counseling Complete  Palliative Care Screening Complete  Medication Review Oceanographer) Complete

## 2023-10-26 NOTE — Progress Notes (Signed)
 Mobility Specialist Progress Note:   10/26/23 1410  Therapy Vitals  Temp 98 F (36.7 C)  Temp Source Axillary  Pulse Rate 76  Resp 17  BP (!) 154/75  Patient Position (if appropriate) Sitting  Oxygen  Therapy  SpO2 96 %  O2 Device Room Air  Mobility  Activity Transferred from chair to bed  Level of Assistance +2 (takes two people)  Location Manager Ambulated (ft) 4 ft  Activity Response Tolerated fair  Mobility Referral No  Mobility visit 1 Mobility  Mobility Specialist Start Time (ACUTE ONLY) 1600  Mobility Specialist Stop Time (ACUTE ONLY) 1609  Mobility Specialist Time Calculation (min) (ACUTE ONLY) 9 min   Pt received in chair, needing to get back to bed. Able to stand after x2 attempts w/ modA+2. Required verbal cues throughout. C/o bilat shoulder pain. Pt left in bed with call bell and RN present.  D'Vante Nicholaus Mobility Specialist Please contact via Special Educational Needs Teacher or Rehab office at [phone]

## 2023-10-27 DIAGNOSIS — R079 Chest pain, unspecified: Secondary | ICD-10-CM | POA: Diagnosis not present

## 2023-10-27 DIAGNOSIS — R0789 Other chest pain: Secondary | ICD-10-CM | POA: Diagnosis not present

## 2023-10-27 DIAGNOSIS — R778 Other specified abnormalities of plasma proteins: Secondary | ICD-10-CM | POA: Diagnosis not present

## 2023-10-27 DIAGNOSIS — K219 Gastro-esophageal reflux disease without esophagitis: Secondary | ICD-10-CM | POA: Diagnosis not present

## 2023-10-27 DIAGNOSIS — E785 Hyperlipidemia, unspecified: Secondary | ICD-10-CM | POA: Diagnosis not present

## 2023-10-27 LAB — CBC
HCT: 26.4 % — ABNORMAL LOW (ref 36.0–46.0)
Hemoglobin: 8.8 g/dL — ABNORMAL LOW (ref 12.0–15.0)
MCH: 28.4 pg (ref 26.0–34.0)
MCHC: 33.3 g/dL (ref 30.0–36.0)
MCV: 85.2 fL (ref 80.0–100.0)
Platelets: 396 10*3/uL (ref 150–400)
RBC: 3.1 MIL/uL — ABNORMAL LOW (ref 3.87–5.11)
RDW: 18.4 % — ABNORMAL HIGH (ref 11.5–15.5)
WBC: 8.2 10*3/uL (ref 4.0–10.5)
nRBC: 0 % (ref 0.0–0.2)

## 2023-10-27 LAB — RENAL FUNCTION PANEL
Albumin: 2.7 g/dL — ABNORMAL LOW (ref 3.5–5.0)
Anion gap: 14 (ref 5–15)
BUN: 31 mg/dL — ABNORMAL HIGH (ref 8–23)
CO2: 26 mmol/L (ref 22–32)
Calcium: 7.4 mg/dL — ABNORMAL LOW (ref 8.9–10.3)
Chloride: 91 mmol/L — ABNORMAL LOW (ref 98–111)
Creatinine, Ser: 6.19 mg/dL — ABNORMAL HIGH (ref 0.44–1.00)
GFR, Estimated: 7 mL/min — ABNORMAL LOW (ref >60)
Glucose, Bld: 75 mg/dL (ref 70–99)
Phosphorus: 5.1 mg/dL — ABNORMAL HIGH (ref 2.5–4.6)
Potassium: 4 mmol/L (ref 3.5–5.1)
Sodium: 131 mmol/L — ABNORMAL LOW (ref 135–145)

## 2023-10-27 LAB — GLUCOSE, CAPILLARY
Glucose-Capillary: 148 mg/dL — ABNORMAL HIGH (ref 70–99)
Glucose-Capillary: 111 mg/dL — ABNORMAL HIGH (ref 70–99)

## 2023-10-27 MED ORDER — PENTAFLUOROPROP-TETRAFLUOROETH EX AERO
1.0000 | INHALATION_SPRAY | CUTANEOUS | Status: DC | PRN
Start: 2023-10-27 — End: 2023-10-27

## 2023-10-27 MED ORDER — LIDOCAINE HCL (PF) 1 % IJ SOLN
5.0000 mL | INTRAMUSCULAR | Status: DC | PRN
Start: 2023-10-27 — End: 2023-10-27

## 2023-10-27 MED ORDER — LIDOCAINE-PRILOCAINE 2.5-2.5 % EX CREA: 1.0000 | TOPICAL_CREAM | CUTANEOUS | Status: DC | PRN

## 2023-10-27 MED ORDER — HEPARIN SODIUM (PORCINE) 1000 UNIT/ML IJ SOLN
3200.0000 [IU] | Freq: Once | INTRAMUSCULAR | Status: AC
  Administered 2023-10-27: 3200 [IU]
  Filled 2023-10-27: qty 4

## 2023-10-27 MED ORDER — ANTICOAGULANT SODIUM CITRATE 4% (200MG/5ML) IV SOLN: 5.0000 mL | Status: DC | PRN

## 2023-10-27 MED ORDER — NEPRO/CARBSTEADY PO LIQD: 237.0000 mL | ORAL | Status: DC | PRN

## 2023-10-27 MED ORDER — ALTEPLASE 2 MG IJ SOLR: 2.0000 mg | Freq: Once | INTRAMUSCULAR | Status: DC | PRN

## 2023-10-27 MED ORDER — OXYCODONE HCL 5 MG PO TABS
5.0000 mg | ORAL_TABLET | Freq: Four times a day (QID) | ORAL | Status: AC | PRN
  Administered 2023-10-27 (×2): 5 mg via ORAL
  Filled 2023-10-27 (×2): qty 1

## 2023-10-27 NOTE — Progress Notes (Addendum)
 Physical Therapy Treatment Patient Details Name: Carolyn Russo MRN: [id number] DOB: 06/15/1951 Today's Date: 10/27/2023   History of Present Illness Pt is 73 yo female admitted on 10/24/23 with chest pain during HD -per notes demand ischemia in setting of ESRD.  Pt with hx including but not limited to ESRD on HD MWF, CHF, CVA with L residual weakness, R BKA, seizure, DM2    PT Comments  The pt is motivated to make progress to return home, but remains limited by deficits in strength, coordination, balance, cognition, and activity tolerance. She is currently at high risk for falls, often attempting to sit prematurely and leaning posteriorly when standing. She needs extended periods of time and extensive step-by-step multi-modal cues to sequence all functional mobility. She required modA for bed mobility, mod-maxA to transfer sit <> stand depending on the height of the sitting surface, and modA to step pivot from bed > recliner with RW support. Attempted to advance pt's gait distance with the husband providing a chair follow, but pt was unable to tolerate standing long enough or gain her balance well enough to safely proceed with this plan. Discussed rehab options with pt and husband. Husband is currently in agreement for short-term inpatient rehab, < 3 hours/day. If pt progresses to being easily managed by +1 assist for functional mobility while here then pt may be able to d/c home with [facility], but she is currently not near that functional level. Will continue to follow acutely.   Of note, pain in L chest and posterior shoulder were reproduced with palpation of pt's L pectoralis muscle belly and her posterior back muscles. Provided soft tissue mobilization to pt tolerance to these trigger points and educated husband on stretches to address it.   If plan is discharge home, recommend the following: A lot of help with bathing/dressing/bathroom;Assistance with cooking/housework;Direct supervision/assist for  medications management;Direct supervision/assist for financial management;Assist for transportation;Help with stairs or ramp for entrance   Can travel by private vehicle     No  Equipment Recommendations  Hoyer lift;BSC/3in1    Recommendations for Other Services       Precautions / Restrictions Precautions Precautions: Fall Restrictions Weight Bearing Restrictions Per Provider Order: No     Mobility  Bed Mobility Overal bed mobility: Needs Assistance Bed Mobility: Supine to Sit     Supine to sit: HOB elevated, Used rails, Mod assist     General bed mobility comments: Step-by-step cues provided for pt to bring bil legs off R EOB, grab therapist's hand with her L UE to pull to prop her trunk over to her R elbow, then push through her R UE to ascend her trunk. ModA provided at trunk and to scoot L hip to EOB    Transfers Overall transfer level: Needs assistance Equipment used: Rolling walker (2 wheels) Transfers: Sit to/from Stand, Bed to chair/wheelchair/BSC Sit to Stand: Mod assist, Max assist, From elevated surface   Step pivot transfers: Mod assist       General transfer comment: EOB elevated for first sit to stand rep, needing modA to power up to stand and shift her weight anteriorly as she tends to lean posteriorly. MaxA the second rep when transferring to stand from the recliner, needing extensive cues and assistance to shift her weight anteriorly and extend her trunk to stand upright. Pt placing L hand on RW with assistance. Cues provided for pt to push up from sitting each rep with R hand on sitting surface. ModA needed for balance when  step pivoting to R from bed to recliner with RW support. Pt initiated steps, but they were unsteady and pt was leaning posteriorly, trying to sit prematurely, while pushing the RW distal to her.    Ambulation/Gait Ambulation/Gait assistance: Mod assist Gait Distance (Feet): 2 Feet Assistive device: Rolling walker (2 wheels) Gait  Pattern/deviations: Step-to pattern, Decreased stride length, Decreased step length - right, Decreased step length - left, Narrow base of support, Trunk flexed, Leaning posteriorly Gait velocity: reduced Gait velocity interpretation: <1.31 ft/sec, indicative of household ambulator   General Gait Details: Pt takes unsteady, narrow, short bil steps to pivot to R from bed to recliner with RW support and modA for balance. Cues provided for upright posture and anterior weight shift, but pt continues to lean posteriorly and attempts to sit prematurely. Pt pushes RW distal to her as well. Attempted to ambulate further with chair follow on her second standing rep but pt stating I can't and I need to sit and was unable to improve her posture and balance to safely progress gait at that time.   Stairs             Wheelchair Mobility     Tilt Bed    Modified Rankin (Stroke Patients Only)       Balance Overall balance assessment: Needs assistance Sitting-balance support: No upper extremity supported, Feet supported Sitting balance-Leahy Scale: Fair Sitting balance - Comments: CGA for static sitting balance Postural control: Posterior lean Standing balance support: Bilateral upper extremity supported, During functional activity, Reliant on assistive device for balance Standing balance-Leahy Scale: Poor Standing balance comment: Reliant on RW and modA, posterior lean noted                            Cognition Arousal: Alert Behavior During Therapy: Flat affect, Anxious Overall Cognitive Status: Impaired/Different from baseline Area of Impairment: Attention, Following commands, Problem solving, Safety/judgement, Awareness                   Current Attention Level: Selective   Following Commands: Follows one step commands consistently, Follows one step commands with increased time, Follows multi-step commands inconsistently Safety/Judgement: Decreased awareness of  safety, Decreased awareness of deficits Awareness: Emergent Problem Solving: Slow processing, Difficulty sequencing, Requires verbal cues, Requires tactile cues General Comments: Pt perseverating on wanting ice. Pt needs multi-modal step-by-step cues to sequence all mobility. Husband seems to have to do this at baseline based on how he interacted with pt. Unsure if pt is at baseline cognition or not        Exercises      General Comments General comments (skin integrity, edema, etc.): spouse present throughout session, agreeable to SNF at this time      Pertinent Vitals/Pain Pain Assessment Pain Assessment: Faces Faces Pain Scale: Hurts little more Pain Location: L chest and L posterior shoulder with palpation of muscles Pain Descriptors / Indicators: Discomfort, Grimacing, Guarding, Moaning, Other (Comment) (I can't breathe) Pain Intervention(s): Limited activity within patient's tolerance, Monitored during session, Repositioned, Other (comment), Utilized relaxation techniques (soft tissue mobilization applied to trigger points)    Home Living                          Prior Function            PT Goals (current goals can now be found in the care plan section) Acute Rehab PT Goals Patient  Stated Goal: to get better and go home PT Goal Formulation: With patient/family Time For Goal Achievement: 11/08/23 Potential to Achieve Goals: Good Progress towards PT goals: Progressing toward goals (slowly)    Frequency    Min 1X/week      PT Plan      Co-evaluation              AM-PAC PT 6 Clicks Mobility   Outcome Measure  Help needed turning from your back to your side while in a flat bed without using bedrails?: A Lot Help needed moving from lying on your back to sitting on the side of a flat bed without using bedrails?: A Lot Help needed moving to and from a bed to a chair (including a wheelchair)?: A Lot Help needed standing up from a chair using  your arms (e.g., wheelchair or bedside chair)?: A Lot Help needed to walk in hospital room?: Total Help needed climbing 3-5 steps with a railing? : Total 6 Click Score: 10    End of Session Equipment Utilized During Treatment: Gait belt Activity Tolerance: Patient tolerated treatment well Patient left: in chair;with call bell/phone within reach;with chair alarm set;with family/visitor present Nurse Communication: Mobility status;Need for lift equipment PT Visit Diagnosis: Other abnormalities of gait and mobility (R26.89);Muscle weakness (generalized) (M62.81);Unsteadiness on feet (R26.81);Difficulty in walking, not elsewhere classified (R26.2)     Time: 8595-8542 PT Time Calculation (min) (ACUTE ONLY): 53 min  Charges:    $Therapeutic Activity: 53-67 mins PT General Charges $$ ACUTE PT VISIT: 1 Visit                     Theo Ferretti, PT, DPT Acute Rehabilitation Services  Office: [phone]    Theo CHRISTELLA Ferretti 10/27/2023, 3:17 PM

## 2023-10-27 NOTE — TOC Progression Note (Addendum)
 Transition of Care [facility]) - Progression Note    Patient Details  Name: Carolyn Russo MRN: [id number] Date of Birth: [birth year]  [facility] CM/SW Contact  Isaiah Public, LCSWA Phone Number: 10/27/2023, 2:06 PM  Clinical Narrative:     CSW Lvm for patients spouse. CSW awaiting call back from patients spouse to discuss dc plan SNF vrs. Patient going home with home health services. CSW will continue to follow  Update- CSW spoke with patients spouse who informed CSW he has decided he wants patient to go to SNF for short term rehab. CSW provided patients spouse with SNF bed offers. Patients spouse would like to review and would like for CSW tomorrow to give him a call to follow up on SNF choice for patient. CSW started insurance authorization for patient Shara ID# [id number]. CSW will add patients facility choice once determined.CSW will continue to follow.   Expected Discharge Plan:  (TBD SNF vrs. going home with hh services) Barriers to Discharge: Continued Medical Work up  Expected Discharge Plan and Services In-house Referral: Clinical Social Work Discharge Planning Services: CM Consult Post Acute Care Choice: Home Health Living arrangements for the past 2 months: Single Family Home Expected Discharge Date: 10/26/23                                     Social Determinants of Health (SDOH) Interventions SDOH Screenings   Food Insecurity: No Food Insecurity (10/25/2023)  Housing: Unknown (10/25/2023)  Transportation Needs: No Transportation Needs (10/25/2023)  Utilities: Not At Risk (10/25/2023)  Alcohol Screen: Low Risk  (01/09/2019)  Depression (PHQ2-9): Low Risk  (09/11/2023)  Financial Resource Strain: Low Risk  (09/02/2021)  Physical Activity: Inactive (09/02/2021)  Social Connections: Socially Integrated (10/25/2023)  Stress: Stress Concern Present (09/02/2021)  Tobacco Use: Low Risk  (10/25/2023)    Readmission Risk Interventions    02/14/2023    4:54 PM   Readmission Risk Prevention Plan  Transportation Screening Complete  PCP or Specialist Appt within 3-5 Days Complete  HRI or Home Care Consult Complete  Social Work Consult for Recovery Care Planning/Counseling Complete  Palliative Care Screening Complete  Medication Review Oceanographer) Complete

## 2023-10-27 NOTE — Progress Notes (Signed)
 Received patient in bed.Awake,alert and oriented x 4.Consent verified.  Access used: Right HD catheter that worked well. Dressing on date.  Duration of treatment: 3.5 hours.  UF goal.Achieved 2.5 L  Tolerated treatment: Yes   Medicine given Tyleno 650 mg.   Hand off to the patient's nurse: Back into her room with stable medical condition via transporter.

## 2023-10-27 NOTE — Plan of Care (Signed)
 Will continue to monitor patient.

## 2023-10-27 NOTE — Procedures (Signed)
 I was present at this dialysis session. I have reviewed the session itself and made appropriate changes.   Vital signs in last 24 hours:  Temp:  [97.8 F (36.6 C)-98.6 F (37 C)] 98.4 F (36.9 C) (01/03 0803) Pulse Rate:  [74-83] 74 (01/03 0521) Resp:  [16-18] 18 (01/03 0521) BP: (142-154)/(66-75) 142/66 (01/03 0521) SpO2:  [96 %-100 %] 100 % (01/03 0521) Weight:  [78 kg] 78 kg (01/03 0521) Weight change: -1.8 kg Filed Weights   10/25/23 1444 10/26/23 0355 10/27/23 0521  Weight: 79.8 kg 79.6 kg 78 kg    Recent Labs  Lab 10/27/23 0520  NA 131*  K 4.0  CL 91*  CO2 26  GLUCOSE 75  BUN 31*  CREATININE 6.19*  CALCIUM  7.4*  PHOS 5.1*    Recent Labs  Lab 10/24/23 1220 10/25/23 0521  WBC 7.9 7.6  HGB 10.0* 9.8*  HCT 28.9* 28.4*  MCV 83.0 82.8  PLT 442* 400    Scheduled Meds:  amitriptyline   50 mg Oral QHS   aspirin  EC  81 mg Oral Daily   calcitRIOL   0.5 mcg Oral Q M,W,F   Chlorhexidine  Gluconate Cloth  6 each Topical Q0600   cinacalcet   30 mg Oral Q breakfast   diclofenac  Sodium  2 g Topical QID   gabapentin   300 mg Oral QHS   heparin  injection (subcutaneous)  5,000 Units Subcutaneous Q8H   heparin  sodium (porcine)  3,800 Units Intracatheter Once   hydrALAZINE   75 mg Oral TID   insulin  aspart  0-5 Units Subcutaneous QHS   insulin  aspart  0-9 Units Subcutaneous TID WC   levETIRAcetam   375 mg Oral Q M,W,F-1800   levETIRAcetam   750 mg Oral QHS   loratadine   10 mg Oral Daily   losartan   50 mg Oral BID   melatonin  5 mg Oral QHS   mometasone -formoterol   2 puff Inhalation BID   pantoprazole   40 mg Oral QHS   pneumococcal 20-valent conjugate vaccine  0.5 mL Intramuscular Tomorrow-1000   rosuvastatin   20 mg Oral QHS   sevelamer  carbonate  0.8 g Oral TID with meals   sodium chloride  flush  3 mL Intravenous Q12H   ticagrelor   90 mg Oral BID   Continuous Infusions:  anticoagulant sodium citrate      PRN Meds:.acetaminophen  **OR** acetaminophen , albuterol ,  alteplase , anticoagulant sodium citrate , feeding supplement (NEPRO CARB STEADY), hydrALAZINE , lidocaine  (PF), lidocaine -prilocaine , ondansetron  **OR** ondansetron  (ZOFRAN ) IV, pentafluoroprop-tetrafluoroeth   Fairy DELENA Sellar,  MD 10/27/2023, 8:47 AM

## 2023-10-27 NOTE — Progress Notes (Signed)
 PROGRESS NOTE    ALLISSON SCHINDEL  FMW:[id number] DOB: 1951/08/14 DOA: 10/24/2023 PCP: Karlene Mungo, MD   Brief Narrative:  HPI: KEYLEN UZELAC is a 73 y.o. female with medical history significant of ESRD on HD, CHF, CVA with residual left-sided weakness, right BKA, seizure disorder, diabetes mellitus type 2 who presents  with sudden onset of severe chest and back pain on the left side during her dialysis session this morning.  Most of history is obtained from the patient's husband who is present at bedside.  The pain was accompanied by severe nausea, but no vomiting or sweating was reported. The patient had been attending dialysis sessions regularly, three times a week, and had not missed any recent sessions.  Patient does report having some shortness of breath at current time.  Denies having any significant cough.   In the past few weeks, the patient has been experiencing increased agitation, difficulty sleeping, and elevated blood pressure. The patient's spouse reported that these symptoms began after the cessation of a beta blocker medication due to concerns about slow pulses and pauses in the heart rate.   The patient also reported a history of amputation, and has been experiencing increased difficulty with mobility and associated pain. The patient's spouse suggested that this could be due to a lack of exercise and poor posture.   The patient has a history of diabetes, which has been well-managed. The patient also reported to have undergone  recent mammogram, after which seem to onset her complaints of chest pain. A subsequent chest x-ray was negative.   The patient's spouse also reported concerns about possible dementia, as the patient has been repeating herself and seeming not to understand instructions. The patient requested a urinary tract infection test, as she still produces urine.    Upon admission into the emergency department patient was noted to be afebrile with blood pressures  elevated at 177/107, and O2 saturations currently maintained on room air.  Labs significant for hemoglobin 10, platelets 442, sodium 132, potassium 3.3, BUN 19, creatinine 4.01, and high-sensitivity troponin 66.  Chest x-ray normal unchanged from prior imaging from 02/12/23 mild pulmonary vascular congestion.  Patient had been given fentanyl  50 mcg IV and full dose aspirin   Assessment & Plan:   Principal Problem:   Chest pain Active Problems:   Elevated troponin   ESRD on dialysis (HCC)   Hypertensive urgency   Hypokalemia   Type 2 diabetes mellitus (HCC)   History of CVA with residual deficit   Memory loss   Primary insomnia   PAD (peripheral artery disease) (HCC)   Seizure disorder (HCC)   Dyslipidemia (high LDL; low HDL)   GERD (gastroesophageal reflux disease)  Chest pain / Elevated troponin, POA: Confirmed with the husband, patient has been complaining of chest pain as well as shoulder pain at times recently but intermittently.  Patient still complains of chest pain.  On my examination, she has point tenderness to the anterior chest suggesting possible musculoskeletal cause of the pain.  Troponins are only slightly elevated and flat, consistent with only demand ischemia in the setting of ESRD.  ACS ruled out.     ESRD on HD Typical dialysis days are Monday Wednesday Friday but has been changed due to holidays, per husband.  She received 2 hours of dialysis Tuesday instead of 3 hours due to chest pain.  Nephrology consulted, she had short duration of dialysis again on 10/25/2023 and having 1 today.   Hypertensive urgency On admission blood pressures  were noted to be elevated up to 177/107.  Patient's husband notes that blood pressures have been elevated ever since Coreg  was discontinued due to bradycardia. Per husband, patient on losartan  50 mg twice daily and hydralazine  50 mg twice daily.  However patient was started on losartan  50 once daily.  Blood pressure still elevated today.   Discussed with the husband, increased losartan  back to 50 twice daily and increasing hydralazine  to 75 mg 3 times daily.  Blood pressure much better today.   Hypokalemia Replenished and resolved.   Controlled diabetes mellitus type 2, with long-term use of insulin  On admission glucose noted to be 126.  At home patient on a very sensitive SSI. -Hypoglycemic protocols -CBGs before every meal with very sensitive SSI -Adjust regimen as needed.   History of CVA with residual deficit Following prior stroke patient is residual left-sided deficits.  Patient finished outpatient PT recently, has been requesting PT OT evaluation here.  I have ordered that.   Memory loss Patient has been noticed that over the last couple of weeks her memory has progressively declined.  She has been repeating things and often getting agitated at night.  He has concerns for dementia, but she has not been formally tested. -Delirium precautions.  Patient currently fully alert and oriented. -May warrant Seroquel  overnight if noted to have significant agitation -Recommend outpatient referral to neurology   Insomnia Patient's husband notes that she previously had been on trazodone  for issues with insomnia at night, but was recently switched to amitriptyline . -Continue amitriptyline .  Consider increasing dose -Melatonin added nightly.   Peripheral vascular disease Status post right BKA   History of seizures -Continue Keppra    Dyslipidemia -Continue Crestor    GERD -Continue Protonix    Obesity BMI 31.24 kg/m  Generalized weakness/deconditioning: Assessed by PT OT per husband's request.  They are recommending SNF.  TOC has discussed with husband, he prefers for the patient to have another session with PT today and if they still recommend SNF, he will be amenable to that.  TOC following.  DVT prophylaxis: heparin  injection 5,000 Units Start: 10/24/23 2200 SCDs Start: 10/24/23 1538   Code Status: Full Code   Family Communication: None present at bedside.  Plan of care discussed with patient in length and he/she verbalized understanding and agreed with it.  Status is: Observation The patient will require care spanning > 2 midnights and should be moved to inpatient because: Getting dialysis today and blood pressure still elevated needs adjustment of medications.   Estimated body mass index is 29.88 kg/m as calculated from the following:   Height as of this encounter: 5' 3 (1.6 m).   Weight as of this encounter: 76.5 kg.    Nutritional Assessment: Body mass index is 29.88 kg/m.SABRA Seen by dietician.  I agree with the assessment and plan as outlined below: Nutrition Status:        . Skin Assessment: I have examined the patient's skin and I agree with the wound assessment as performed by the wound care RN as outlined below:    Consultants:  Nephrology  Procedures:  None  Antimicrobials:  Anti-infectives (From admission, onward)    None         Subjective: Patient seen and examined in dialysis unit.  She has no complaints.  She is sleepy but fully oriented.  Objective: Vitals:   10/27/23 1100 10/27/23 1200 10/27/23 1207 10/27/23 1227  BP: 136/64 101/70 (!) 131/93   Pulse: 78  96   Resp: 16  (!)  24 16  Temp:   97.8 F (36.6 C)   TempSrc:      SpO2: 99%  93% 94%  Weight:   76.5 kg   Height:        Intake/Output Summary (Last 24 hours) at 10/27/2023 1340 Last data filed at 10/27/2023 1200 Gross per 24 hour  Intake --  Output 2500 ml  Net -2500 ml   Filed Weights   10/27/23 0521 10/27/23 0815 10/27/23 1207  Weight: 78 kg 79 kg 76.5 kg    Examination:  General exam: Appears calm and comfortable  Respiratory system: Clear to auscultation. Respiratory effort normal. Cardiovascular system: S1 & S2 heard, RRR. No JVD, murmurs, rubs, gallops or clicks. No pedal edema. Gastrointestinal system: Abdomen is nondistended, soft and nontender. No organomegaly or masses  felt. Normal bowel sounds heard. Central nervous system: Alert and oriented.  Dysarthria and left hemiparesis, both known secondary to previous stroke. Extremities: Right BKA Skin: No rashes, lesions or ulcers.    Data Reviewed: I have personally reviewed following labs and imaging studies  CBC: Recent Labs  Lab 10/24/23 1220 10/25/23 0521 10/27/23 0821  WBC 7.9 7.6 8.2  HGB 10.0* 9.8* 8.8*  HCT 28.9* 28.4* 26.4*  MCV 83.0 82.8 85.2  PLT 442* 400 396   Basic Metabolic Panel: Recent Labs  Lab 10/24/23 1125 10/25/23 0521 10/26/23 0414 10/27/23 0520  NA 132* 132* 132* 131*  K 3.3* 3.9 3.5 4.0  CL 94* 94* 94* 91*  CO2 26 25 27 26   GLUCOSE 126* 88 68* 75  BUN 19 24* 18 31*  CREATININE 4.01* 5.01* 4.57* 6.19*  CALCIUM  8.2* 8.5* 8.1* 7.4*  PHOS  --  3.9 4.1 5.1*   GFR: Estimated Creatinine Clearance: 7.8 mL/min (A) (by C-G formula based on SCr of 6.19 mg/dL (H)). Liver Function Tests: Recent Labs  Lab 10/24/23 1125 10/25/23 0521 10/26/23 0414 10/27/23 0520  AST 18  --   --   --   ALT 14  --   --   --   ALKPHOS 123  --   --   --   BILITOT 0.4  --   --   --   PROT 7.5  --   --   --   ALBUMIN  3.1* 2.9* 2.8* 2.7*   No results for input(s): LIPASE, AMYLASE in the last 168 hours. No results for input(s): AMMONIA in the last 168 hours. Coagulation Profile: No results for input(s): INR, PROTIME in the last 168 hours. Cardiac Enzymes: Recent Labs  Lab 10/24/23 1743  CKTOTAL 100   BNP (last 3 results) No results for input(s): PROBNP in the last 8760 hours. HbA1C: Recent Labs    10/26/23 1130  HGBA1C 5.8*   CBG: Recent Labs  Lab 10/25/23 0848 10/25/23 1220 10/26/23 1205 10/26/23 1630 10/26/23 2054  GLUCAP 91 99 104* 98 142*   Lipid Profile: No results for input(s): CHOL, HDL, LDLCALC, TRIG, CHOLHDL, LDLDIRECT in the last 72 hours. Thyroid  Function Tests: No results for input(s): TSH, T4TOTAL, FREET4, T3FREE, THYROIDAB  in the last 72 hours. Anemia Panel: No results for input(s): VITAMINB12, FOLATE, FERRITIN, TIBC, IRON , RETICCTPCT in the last 72 hours. Sepsis Labs: No results for input(s): PROCALCITON, LATICACIDVEN in the last 168 hours.  No results found for this or any previous visit (from the past 240 hours).   Radiology Studies: ECHOCARDIOGRAM COMPLETE Result Date: 10/25/2023    ECHOCARDIOGRAM REPORT   Patient Name:   RASHANDA MAGLOIRE [facility] Date of Exam: 10/25/2023  Medical Rec #:  [id number]      Height:       63.0 in Accession #:    [id number]     Weight:       176.4 lb Date of Birth:  May 24, 1951      BSA:          1.833 m Patient Age:    72 years       BP:           164/79 mmHg Patient Gender: F              HR:           89 bpm. Exam Location:  Inpatient Procedure: 2D Echo, Color Doppler, Cardiac Doppler and Intracardiac            Opacification Agent Indications:    R07.9* Chest pain, unspecified  History:        Patient has prior history of Echocardiogram examinations, most                 recent 06/04/2022. CAD, PAD and ESRD; Risk Factors:Hypertension,                 Diabetes and Dyslipidemia.  Sonographer:    Damien Senior RDCS Referring Phys: [phone] RONDELL A SMITH IMPRESSIONS  1. Left ventricular ejection fraction, by estimation, is 55 to 60%. The left ventricle has normal function. The left ventricle has no regional wall motion abnormalities. There is moderate left ventricular hypertrophy. Left ventricular diastolic parameters are indeterminate. Elevated left atrial pressure.  2. Right ventricular systolic function is normal. The right ventricular size is normal. There is mildly elevated pulmonary artery systolic pressure.  3. The mitral valve is normal in structure. No evidence of mitral valve regurgitation. No evidence of mitral stenosis.  4. The tricuspid valve is abnormal.  5. The aortic valve is tricuspid. There is mild calcification of the aortic valve. There is mild thickening of the aortic  valve. Aortic valve regurgitation is not visualized. No aortic stenosis is present. FINDINGS  Left Ventricle: Left ventricular ejection fraction, by estimation, is 55 to 60%. The left ventricle has normal function. The left ventricle has no regional wall motion abnormalities. Definity  contrast agent was given IV to delineate the left ventricular  endocardial borders. The left ventricular internal cavity size was normal in size. There is moderate left ventricular hypertrophy. Left ventricular diastolic parameters are indeterminate. Elevated left atrial pressure. Right Ventricle: The right ventricular size is normal. Right vetricular wall thickness was not well visualized. Right ventricular systolic function is normal. There is mildly elevated pulmonary artery systolic pressure. The tricuspid regurgitant velocity  is 2.53 m/s, and with an assumed right atrial pressure of 15 mmHg, the estimated right ventricular systolic pressure is 40.6 mmHg. Left Atrium: Left atrial size was normal in size. Right Atrium: Right atrial size was normal in size. Pericardium: There is no evidence of pericardial effusion. Mitral Valve: The mitral valve is normal in structure. No evidence of mitral valve regurgitation. No evidence of mitral valve stenosis. Tricuspid Valve: The tricuspid valve is abnormal. Tricuspid valve regurgitation is mild . No evidence of tricuspid stenosis. Aortic Valve: The aortic valve is tricuspid. There is mild calcification of the aortic valve. There is mild thickening of the aortic valve. There is mild aortic valve annular calcification. Aortic valve regurgitation is not visualized. No aortic stenosis  is present. Aortic valve mean gradient measures 6.8 mmHg. Aortic valve peak gradient measures 14.1 mmHg. Aortic valve  area, by VTI measures 1.69 cm. Pulmonic Valve: The pulmonic valve was not well visualized. Pulmonic valve regurgitation is mild. No evidence of pulmonic stenosis. Aorta: The aortic root is normal  in size and structure. Venous: The inferior vena cava was not well visualized. IAS/Shunts: No atrial level shunt detected by color flow Doppler.  LEFT VENTRICLE PLAX 2D LVIDd:         4.30 cm   Diastology LVIDs:         2.90 cm   LV e' medial:    4.90 cm/s LV PW:         1.30 cm   LV E/e' medial:  18.7 LV IVS:        1.30 cm   LV e' lateral:   6.20 cm/s LVOT diam:     2.10 cm   LV E/e' lateral: 14.8 LV SV:         59 LV SV Index:   32 LVOT Area:     3.46 cm  RIGHT VENTRICLE RV S prime:     9.90 cm/s TAPSE (M-mode): 1.5 cm LEFT ATRIUM             Index LA diam:        4.10 cm 2.24 cm/m LA Vol (A2C):   51.3 ml 27.99 ml/m LA Vol (A4C):   49.3 ml 26.90 ml/m LA Biplane Vol: 53.4 ml 29.13 ml/m  AORTIC VALVE AV Area (Vmax):    1.60 cm AV Area (Vmean):   1.69 cm AV Area (VTI):     1.69 cm AV Vmax:           187.93 cm/s AV Vmean:          120.975 cm/s AV VTI:            0.349 m AV Peak Grad:      14.1 mmHg AV Mean Grad:      6.8 mmHg LVOT Vmax:         86.65 cm/s LVOT Vmean:        59.147 cm/s LVOT VTI:          0.171 m LVOT/AV VTI ratio: 0.49  AORTA Ao Root diam: 2.60 cm Ao Asc diam:  2.90 cm MITRAL VALVE               TRICUSPID VALVE MV Area (PHT): 4.46 cm    TR Peak grad:   25.6 mmHg MV Decel Time: 170 msec    TR Vmax:        253.00 cm/s MV E velocity: 91.70 cm/s MV A velocity: 81.40 cm/s  SHUNTS MV E/A ratio:  1.13        Systemic VTI:  0.17 m                            Systemic Diam: 2.10 cm Dorn Ross MD Electronically signed by Dorn Ross MD Signature Date/Time: 10/25/2023/2:32:16 PM    Final     Scheduled Meds:  amitriptyline   50 mg Oral QHS   aspirin  EC  81 mg Oral Daily   calcitRIOL   0.5 mcg Oral Q M,W,F   Chlorhexidine  Gluconate Cloth  6 each Topical Q0600   cinacalcet   30 mg Oral Q breakfast   diclofenac  Sodium  2 g Topical QID   gabapentin   300 mg Oral QHS   heparin  injection (subcutaneous)  5,000 Units Subcutaneous Q8H   heparin  sodium (porcine)  3,800 Units Intracatheter Once  hydrALAZINE   75 mg Oral TID   insulin  aspart  0-5 Units Subcutaneous QHS   insulin  aspart  0-9 Units Subcutaneous TID WC   levETIRAcetam   375 mg Oral Q M,W,F-1800   levETIRAcetam   750 mg Oral QHS   loratadine   10 mg Oral Daily   losartan   50 mg Oral BID   melatonin  5 mg Oral QHS   mometasone -formoterol   2 puff Inhalation BID   pantoprazole   40 mg Oral QHS   pneumococcal 20-valent conjugate vaccine  0.5 mL Intramuscular Tomorrow-1000   rosuvastatin   20 mg Oral QHS   sevelamer  carbonate  0.8 g Oral TID with meals   sodium chloride  flush  3 mL Intravenous Q12H   ticagrelor   90 mg Oral BID   Continuous Infusions:   LOS: 0 days   Fredia Skeeter, MD [facility]  10/27/2023, 1:40 PM   *Please note that this is a verbal dictation therefore any spelling or grammatical errors are due to the Dragon Medical One system interpretation.  Please page via Amion and do not message via secure chat for urgent patient care matters. Secure chat can be used for non urgent patient care matters.  How to contact the [facility] Attending or Consulting provider 7A - 7P or covering provider during after hours 7P -7A, for this patient?  Check the care team in [facility] and look for a) attending/consulting [facility] provider listed and b) the [facility] team listed. Page or secure chat 7A-7P. Log into [email or web address] and use [place]'s universal password to access. If you do not have the password, please contact the hospital operator. Locate the [facility] provider you are looking for under [facility] and page to a number that you can be directly reached. If you still have difficulty reaching the provider, please page the [facility] (Director on Call) for the Hospitalists listed on amion for assistance.

## 2023-10-27 NOTE — Progress Notes (Signed)
 Mobility Specialist Progress Note:    10/27/23 1413  Mobility  Activity Transferred to/from [facility]  Level of Assistance Other (Comment) (ModA+3)  Assistive Device Front wheel walker  Distance Ambulated (ft) 3 ft  Activity Response Tolerated fair  Mobility Referral No  Mobility visit 1 Mobility  Mobility Specialist Start Time (ACUTE ONLY) 1354  Mobility Specialist Stop Time (ACUTE ONLY) 1405  Mobility Specialist Time Calculation (min) (ACUTE ONLY) 11 min   Pt received on BSC, requesting assistance back to bed. Required modA+3 to stand w/ RW and pivot back to bed. Pt having much trouble clearing feet off ground to take steps. Also needing cues for flexed posture and attempting to sit prematurely. Pt left in bed w/ RN and PT present.  D'Vante Nicholaus Mobility Specialist Please contact via Special Educational Needs Teacher or Rehab office at [phone]

## 2023-10-27 NOTE — Progress Notes (Signed)
 Pt was complaining of chest pain few minutes ago, no sign of distress or discomfort, vital signs stable, O2sat 100% on 2L, Normal Sinus on EKG , pt fell asleep after EKG performed,Attending notified by secure chat, will continue to monitor.

## 2023-10-27 NOTE — Progress Notes (Signed)
 PT Cancellation Note  Patient Details Name: Carolyn Russo MRN: [id number] DOB: 03-14-1951   Cancelled Treatment:    Reason Eval/Treat Not Completed: (P) Patient at procedure or test/unavailable. Pt at HD. Will plan to follow-up later as time permits.   Theo Ferretti, PT, DPT Acute Rehabilitation Services  Office: [phone]    Theo CHRISTELLA Ferretti 10/27/2023, 10:56 AM

## 2023-10-28 DIAGNOSIS — R079 Chest pain, unspecified: Secondary | ICD-10-CM | POA: Diagnosis not present

## 2023-10-28 LAB — RENAL FUNCTION PANEL
Albumin: 2.9 g/dL — ABNORMAL LOW (ref 3.5–5.0)
Anion gap: 11 (ref 5–15)
BUN: 21 mg/dL (ref 8–23)
CO2: 24 mmol/L (ref 22–32)
Calcium: 8 mg/dL — ABNORMAL LOW (ref 8.9–10.3)
Chloride: 97 mmol/L — ABNORMAL LOW (ref 98–111)
Creatinine, Ser: 4.32 mg/dL — ABNORMAL HIGH (ref 0.44–1.00)
GFR, Estimated: 10 mL/min — ABNORMAL LOW (ref >60)
Glucose, Bld: 82 mg/dL (ref 70–99)
Phosphorus: 3.4 mg/dL (ref 2.5–4.6)
Potassium: 4.3 mmol/L (ref 3.5–5.1)
Sodium: 132 mmol/L — ABNORMAL LOW (ref 135–145)

## 2023-10-28 LAB — GLUCOSE, CAPILLARY
Glucose-Capillary: 83 mg/dL (ref 70–99)
Glucose-Capillary: 108 mg/dL — ABNORMAL HIGH (ref 70–99)
Glucose-Capillary: 140 mg/dL — ABNORMAL HIGH (ref 70–99)
Glucose-Capillary: 127 mg/dL — ABNORMAL HIGH (ref 70–99)

## 2023-10-28 LAB — MRSA NEXT GEN BY PCR, NASAL: MRSA by PCR Next Gen: NOT DETECTED

## 2023-10-28 MED ORDER — BISACODYL 10 MG RE SUPP
10.0000 mg | Freq: Once | RECTAL | Status: DC
  Filled 2023-10-28: qty 1

## 2023-10-28 MED ORDER — DARBEPOETIN ALFA 150 MCG/0.3ML IJ SOSY
150.0000 ug | PREFILLED_SYRINGE | Freq: Once | INTRAMUSCULAR | Status: AC
  Administered 2023-10-28: 150 ug via SUBCUTANEOUS
  Filled 2023-10-28 (×2): qty 0.3

## 2023-10-28 NOTE — Progress Notes (Signed)
 [facility] Progress Note   Subjective:  Seen in room. Completed dialysis yesterday with 2.5L removed. Endorses continued chest discomfort.   Objective Vitals:   10/28/23 0400 10/28/23 0752 10/28/23 0753 10/28/23 0850  BP: 114/61  (!) 94/53 (!) 168/76  Pulse: 87   83  Resp: 18  20 18   Temp: 97.9 F (36.6 C)  98.2 F (36.8 C)   TempSrc: Oral  Oral   SpO2:  96% 96% 95%  Weight: 77.1 kg     Height:       Physical Exam General: Lying in bed, nad  Heart: RRR No m,r,g Lungs: Clear bilaterally, normal wob Abdomen: Soft, non-distended  Extremities: No edema b/l lower extremities Dialysis Access: R internal jugular [facility]  Additional Objective Labs: Basic Metabolic Panel: Recent Labs  Lab 10/26/23 0414 10/27/23 0520 10/28/23 0400  NA 132* 131* 132*  K 3.5 4.0 4.3  CL 94* 91* 97*  CO2 27 26 24   GLUCOSE 68* 75 82  BUN 18 31* 21  CREATININE 4.57* 6.19* 4.32*  CALCIUM  8.1* 7.4* 8.0*  PHOS 4.1 5.1* 3.4   Liver Function Tests: Recent Labs  Lab 10/24/23 1125 10/25/23 0521 10/26/23 0414 10/27/23 0520 10/28/23 0400  AST 18  --   --   --   --   ALT 14  --   --   --   --   ALKPHOS 123  --   --   --   --   BILITOT 0.4  --   --   --   --   PROT 7.5  --   --   --   --   ALBUMIN  3.1*   < > 2.8* 2.7* 2.9*   < > = values in this interval not displayed.   No results for input(s): LIPASE, AMYLASE in the last 168 hours. CBC: Recent Labs  Lab 10/24/23 1220 10/25/23 0521 10/27/23 0821  WBC 7.9 7.6 8.2  HGB 10.0* 9.8* 8.8*  HCT 28.9* 28.4* 26.4*  MCV 83.0 82.8 85.2  PLT 442* 400 396   Blood Culture    Component Value Date/Time   SDES BLOOD RIGHT HAND 06/22/2022 1332   SPECREQUEST  06/22/2022 1332    AEROBIC BOTTLE ONLY Blood Culture results may not be optimal due to an inadequate volume of blood received in culture bottles   CULT  06/22/2022 1332    NO GROWTH 5 DAYS Performed at [facility], [address], [place], [place] [address]     REPTSTATUS 06/27/2022 FINAL 06/22/2022 1332    Cardiac Enzymes: Recent Labs  Lab 10/24/23 1743  CKTOTAL 100   CBG: Recent Labs  Lab 10/26/23 1630 10/26/23 2054 10/27/23 1612 10/27/23 2118 10/28/23 0754  GLUCAP 98 142* 148* 111* 83   Iron  Studies: No results for input(s): IRON , TIBC, TRANSFERRIN, FERRITIN in the last 72 hours. @lablastinr3 @ Studies/Results: No results found.  Medications:   amitriptyline   50 mg Oral QHS   aspirin  EC  81 mg Oral Daily   calcitRIOL   0.5 mcg Oral Q M,W,F   Chlorhexidine  Gluconate Cloth  6 each Topical Q0600   cinacalcet   30 mg Oral Q breakfast   diclofenac  Sodium  2 g Topical QID   gabapentin   300 mg Oral QHS   heparin  injection (subcutaneous)  5,000 Units Subcutaneous Q8H   heparin  sodium (porcine)  3,800 Units Intracatheter Once   hydrALAZINE   75 mg Oral TID   insulin  aspart  0-5 Units Subcutaneous QHS  insulin  aspart  0-9 Units Subcutaneous TID WC   levETIRAcetam   375 mg Oral Q M,W,F-1800   levETIRAcetam   750 mg Oral QHS   loratadine   10 mg Oral Daily   losartan   50 mg Oral BID   melatonin  5 mg Oral QHS   mometasone -formoterol   2 puff Inhalation BID   pantoprazole   40 mg Oral QHS   pneumococcal 20-valent conjugate vaccine  0.5 mL Intramuscular Tomorrow-1000   rosuvastatin   20 mg Oral QHS   sevelamer  carbonate  0.8 g Oral TID with meals   sodium chloride  flush  3 mL Intravenous Q12H   ticagrelor   90 mg Oral BID    Dialysis Orders: Center: [facility] M[facility]   4h 350/1.5 EDW 78.9 kg  2/2 bath RIJ TDC  Heparin  2600 -profile 2 - last HD 12/31, post wt 80.4 kg   - hectorol  IV 4 mcg tiw - venofer  100mg  tiw thru 4/24 - mircera 200 mcg IV q 2 wks, due 10/27/23  Assessment/Plan: Chest pain - troponins flat, likely demand ischemia.  Likely musculoskeletal source of pain given reproduction with palpation.  ACS ruled out.  ESRD -  HD MWF. Continue on schedule. Next HD 1/6 Hypertension/volume  -BP improved. Now under dry weight. Net UF  2.5L Post wt 77.1 kg on 10/27/23 Anemia  - Hgb 8.8. Due for esa - will order today  Metabolic bone disease -  Calcium  and phos controlled, continue with home meds Nutrition - renal diet, carb modified Diabetes Mellitus type 2 - per primary svc Dispo - For SNF placement    Maisie Ronnald Acosta PA-C [facility] 10/28/2023,9:00 AM

## 2023-10-28 NOTE — Plan of Care (Signed)
 Will continue to monitor patient.

## 2023-10-28 NOTE — Progress Notes (Signed)
 PROGRESS NOTE    Carolyn Russo  FMW:[id number] DOB: 02-Oct-1951 DOA: 10/24/2023 PCP: Karlene Mungo, MD   Brief Narrative:  HPI: Carolyn Russo is a 73 y.o. female with medical history significant of ESRD on HD, CHF, CVA with residual left-sided weakness, right BKA, seizure disorder, diabetes mellitus type 2 who presents  with sudden onset of severe chest and back pain on the left side during her dialysis session this morning.  Most of history is obtained from the patient's husband who is present at bedside.  The pain was accompanied by severe nausea, but no vomiting or sweating was reported. The patient had been attending dialysis sessions regularly, three times a week, and had not missed any recent sessions.  Patient does report having some shortness of breath at current time.  Denies having any significant cough.   In the past few weeks, the patient has been experiencing increased agitation, difficulty sleeping, and elevated blood pressure. The patient's spouse reported that these symptoms began after the cessation of a beta blocker medication due to concerns about slow pulses and pauses in the heart rate.   The patient also reported a history of amputation, and has been experiencing increased difficulty with mobility and associated pain. The patient's spouse suggested that this could be due to a lack of exercise and poor posture.   The patient has a history of diabetes, which has been well-managed. The patient also reported to have undergone  recent mammogram, after which seem to onset her complaints of chest pain. A subsequent chest x-ray was negative.   The patient's spouse also reported concerns about possible dementia, as the patient has been repeating herself and seeming not to understand instructions. The patient requested a urinary tract infection test, as she still produces urine.    Upon admission into the emergency department patient was noted to be afebrile with blood pressures  elevated at 177/107, and O2 saturations currently maintained on room air.  Labs significant for hemoglobin 10, platelets 442, sodium 132, potassium 3.3, BUN 19, creatinine 4.01, and high-sensitivity troponin 66.  Chest x-ray normal unchanged from prior imaging from 02/12/23 mild pulmonary vascular congestion.  Patient had been given fentanyl  50 mcg IV and full dose aspirin   Assessment & Plan:   Principal Problem:   Chest pain Active Problems:   Elevated troponin   ESRD on dialysis (HCC)   Hypertensive urgency   Hypokalemia   Type 2 diabetes mellitus (HCC)   History of CVA with residual deficit   Memory loss   Primary insomnia   PAD (peripheral artery disease) (HCC)   Seizure disorder (HCC)   Dyslipidemia (high LDL; low HDL)   GERD (gastroesophageal reflux disease)  Chest pain / Elevated troponin, POA: Confirmed with the husband, patient has been complaining of chest pain as well as shoulder pain at times recently but intermittently.  Patient still complains of chest pain.  On my examination, she has point tenderness to the anterior chest suggesting possible musculoskeletal cause of the pain.  Troponins are only slightly elevated and flat, consistent with only demand ischemia in the setting of ESRD.  ACS ruled out.     ESRD on HD Typical dialysis days are Monday Wednesday Friday but has been changed due to holidays, per husband.  She received 2 hours of dialysis Tuesday instead of 3 hours due to chest pain.  Nephrology consulted, she had short duration of dialysis again on 10/25/2023 and having 1 today.   Hypertensive urgency On admission blood pressures  were noted to be elevated up to 177/107.  Patient's husband notes that blood pressures have been elevated ever since Coreg  was discontinued due to bradycardia. Per husband, patient on losartan  50 mg twice daily and hydralazine  50 mg twice daily.  However patient was started on losartan  50 once daily.  Blood pressure still elevated today.   Discussed with the husband, increased losartan  back to 50 twice daily and increasing hydralazine  to 75 mg 3 times daily.  Blood pressure a little labile but in acceptable range.   Hypokalemia Replenished and resolved.   Controlled diabetes mellitus type 2, with long-term use of insulin  On admission glucose noted to be 126.  At home patient on a very sensitive SSI. -Hypoglycemic protocols -CBGs before every meal with very sensitive SSI -Adjust regimen as needed.   History of CVA with residual deficit Following prior stroke patient is residual left-sided deficits.  Patient finished outpatient PT recently, has been requesting PT OT evaluation here.  I have ordered that.   Memory loss Patient has been noticed that over the last couple of weeks her memory has progressively declined.  She has been repeating things and often getting agitated at night.  He has concerns for dementia, but she has not been formally tested. -Delirium precautions.  Patient currently fully alert and oriented. -May warrant Seroquel  overnight if noted to have significant agitation -Recommend outpatient referral to neurology   Insomnia Patient's husband notes that she previously had been on trazodone  for issues with insomnia at night, but was recently switched to amitriptyline . -Continue amitriptyline .  Consider increasing dose -Melatonin added nightly.   Peripheral vascular disease Status post right BKA   History of seizures -Continue Keppra    Dyslipidemia -Continue Crestor    GERD -Continue Protonix    Obesity BMI 31.24 kg/m  Generalized weakness/deconditioning: Assessed by PT OT per husband's request.  They recommend SNF, TOC working on placement.  DVT prophylaxis: heparin  injection 5,000 Units Start: 10/24/23 2200 SCDs Start: 10/24/23 1538   Code Status: Full Code  Family Communication: None present at bedside.   Status is: Observation The patient will require care spanning > 2 midnights and should  be moved to inpatient because: Getting dialysis today and blood pressure still elevated needs adjustment of medications.   Estimated body mass index is 30.11 kg/m as calculated from the following:   Height as of this encounter: 5' 3 (1.6 m).   Weight as of this encounter: 77.1 kg.    Nutritional Assessment: Body mass index is 30.11 kg/m.SABRA Seen by dietician.  I agree with the assessment and plan as outlined below: Nutrition Status:        . Skin Assessment: I have examined the patient's skin and I agree with the wound assessment as performed by the wound care RN as outlined below:    Consultants:  Nephrology  Procedures:  None  Antimicrobials:  Anti-infectives (From admission, onward)    None         Subjective: Seen and examined.  She was complaining of back pain and was complaining that the floor did not have heating pad for her.  She was requesting cold packs.  Objective: Vitals:   10/28/23 0400 10/28/23 0752 10/28/23 0753 10/28/23 0850  BP: 114/61  (!) 94/53 (!) 168/76  Pulse: 87   83  Resp: 18  20 18   Temp: 97.9 F (36.6 C)  98.2 F (36.8 C)   TempSrc: Oral  Oral   SpO2:  96% 96% 95%  Weight: 77.1 kg  Height:        Intake/Output Summary (Last 24 hours) at 10/28/2023 1124 Last data filed at 10/28/2023 0902 Gross per 24 hour  Intake 120 ml  Output 2500 ml  Net -2380 ml   Filed Weights   10/27/23 0815 10/27/23 1207 10/28/23 0400  Weight: 79 kg 76.5 kg 77.1 kg    Examination:  General exam: Appears calm and comfortable  Respiratory system: Clear to auscultation. Respiratory effort normal. Cardiovascular system: S1 & S2 heard, RRR. No JVD, murmurs, rubs, gallops or clicks. No pedal edema. Gastrointestinal system: Abdomen is nondistended, soft and nontender. No organomegaly or masses felt. Normal bowel sounds heard. Central nervous system: Alert and oriented.  Dysarthria and left hemiparesis, both known secondary to previous  stroke. Extremities: Right BKA Skin: No rashes, lesions or ulcers.    Data Reviewed: I have personally reviewed following labs and imaging studies  CBC: Recent Labs  Lab 10/24/23 1220 10/25/23 0521 10/27/23 0821  WBC 7.9 7.6 8.2  HGB 10.0* 9.8* 8.8*  HCT 28.9* 28.4* 26.4*  MCV 83.0 82.8 85.2  PLT 442* 400 396   Basic Metabolic Panel: Recent Labs  Lab 10/24/23 1125 10/25/23 0521 10/26/23 0414 10/27/23 0520 10/28/23 0400  NA 132* 132* 132* 131* 132*  K 3.3* 3.9 3.5 4.0 4.3  CL 94* 94* 94* 91* 97*  CO2 26 25 27 26 24   GLUCOSE 126* 88 68* 75 82  BUN 19 24* 18 31* 21  CREATININE 4.01* 5.01* 4.57* 6.19* 4.32*  CALCIUM  8.2* 8.5* 8.1* 7.4* 8.0*  PHOS  --  3.9 4.1 5.1* 3.4   GFR: Estimated Creatinine Clearance: 11.2 mL/min (A) (by C-G formula based on SCr of 4.32 mg/dL (H)). Liver Function Tests: Recent Labs  Lab 10/24/23 1125 10/25/23 0521 10/26/23 0414 10/27/23 0520 10/28/23 0400  AST 18  --   --   --   --   ALT 14  --   --   --   --   ALKPHOS 123  --   --   --   --   BILITOT 0.4  --   --   --   --   PROT 7.5  --   --   --   --   ALBUMIN  3.1* 2.9* 2.8* 2.7* 2.9*   No results for input(s): LIPASE, AMYLASE in the last 168 hours. No results for input(s): AMMONIA in the last 168 hours. Coagulation Profile: No results for input(s): INR, PROTIME in the last 168 hours. Cardiac Enzymes: Recent Labs  Lab 10/24/23 1743  CKTOTAL 100   BNP (last 3 results) No results for input(s): PROBNP in the last 8760 hours. HbA1C: Recent Labs    10/26/23 1130  HGBA1C 5.8*   CBG: Recent Labs  Lab 10/26/23 1630 10/26/23 2054 10/27/23 1612 10/27/23 2118 10/28/23 0754  GLUCAP 98 142* 148* 111* 83   Lipid Profile: No results for input(s): CHOL, HDL, LDLCALC, TRIG, CHOLHDL, LDLDIRECT in the last 72 hours. Thyroid  Function Tests: No results for input(s): TSH, T4TOTAL, FREET4, T3FREE, THYROIDAB in the last 72 hours. Anemia Panel: No  results for input(s): VITAMINB12, FOLATE, FERRITIN, TIBC, IRON , RETICCTPCT in the last 72 hours. Sepsis Labs: No results for input(s): PROCALCITON, LATICACIDVEN in the last 168 hours.  Recent Results (from the past 240 hours)  MRSA Next Gen by PCR, Nasal     Status: None   Collection Time: 10/28/23  4:03 AM   Specimen: Nasal Mucosa; Nasal Swab  Result Value Ref Range Status  MRSA by PCR Next Gen NOT DETECTED NOT DETECTED Final    Comment: (NOTE) The GeneXpert MRSA Assay (FDA approved for NASAL specimens only), is one component of a comprehensive MRSA colonization surveillance program. It is not intended to diagnose MRSA infection nor to guide or monitor treatment for MRSA infections. Test performance is not FDA approved in patients less than 14 years old. Performed at [facility], [address], [place], [place] [address]      Radiology Studies: No results found.   Scheduled Meds:  amitriptyline   50 mg Oral QHS   aspirin  EC  81 mg Oral Daily   calcitRIOL   0.5 mcg Oral Q M,W,F   Chlorhexidine  Gluconate Cloth  6 each Topical Q0600   cinacalcet   30 mg Oral Q breakfast   darbepoetin (ARANESP ) injection - DIALYSIS  150 mcg Subcutaneous Once   diclofenac  Sodium  2 g Topical QID   gabapentin   300 mg Oral QHS   heparin  injection (subcutaneous)  5,000 Units Subcutaneous Q8H   heparin  sodium (porcine)  3,800 Units Intracatheter Once   hydrALAZINE   75 mg Oral TID   insulin  aspart  0-5 Units Subcutaneous QHS   insulin  aspart  0-9 Units Subcutaneous TID WC   levETIRAcetam   375 mg Oral Q M,W,F-1800   levETIRAcetam   750 mg Oral QHS   loratadine   10 mg Oral Daily   losartan   50 mg Oral BID   melatonin  5 mg Oral QHS   mometasone -formoterol   2 puff Inhalation BID   pantoprazole   40 mg Oral QHS   rosuvastatin   20 mg Oral QHS   sevelamer  carbonate  0.8 g Oral TID with meals   sodium chloride  flush  3 mL Intravenous Q12H   ticagrelor   90 mg Oral BID    Continuous Infusions:   LOS: 0 days   Fredia Skeeter, MD [facility]  10/28/2023, 11:24 AM   *Please note that this is a verbal dictation therefore any spelling or grammatical errors are due to the Dragon Medical One system interpretation.  Please page via Amion and do not message via secure chat for urgent patient care matters. Secure chat can be used for non urgent patient care matters.  How to contact the [facility] Attending or Consulting provider 7A - 7P or covering provider during after hours 7P -7A, for this patient?  Check the care team in [facility] and look for a) attending/consulting [facility] provider listed and b) the [facility] team listed. Page or secure chat 7A-7P. Log into [email or web address] and use [facility]'s universal password to access. If you do not have the password, please contact the hospital operator. Locate the [facility] provider you are looking for under [facility] and page to a number that you can be directly reached. If you still have difficulty reaching the provider, please page the [facility] (Director on Call) for the Hospitalists listed on amion for assistance.

## 2023-10-29 DIAGNOSIS — R079 Chest pain, unspecified: Secondary | ICD-10-CM | POA: Diagnosis not present

## 2023-10-29 LAB — RENAL FUNCTION PANEL
Albumin: 2.8 g/dL — ABNORMAL LOW (ref 3.5–5.0)
Anion gap: 11 (ref 5–15)
BUN: 27 mg/dL — ABNORMAL HIGH (ref 8–23)
CO2: 24 mmol/L (ref 22–32)
Calcium: 7.9 mg/dL — ABNORMAL LOW (ref 8.9–10.3)
Chloride: 94 mmol/L — ABNORMAL LOW (ref 98–111)
Creatinine, Ser: 5.92 mg/dL — ABNORMAL HIGH (ref 0.44–1.00)
GFR, Estimated: 7 mL/min — ABNORMAL LOW (ref >60)
Glucose, Bld: 84 mg/dL (ref 70–99)
Phosphorus: 4 mg/dL (ref 2.5–4.6)
Potassium: 4 mmol/L (ref 3.5–5.1)
Sodium: 129 mmol/L — ABNORMAL LOW (ref 135–145)

## 2023-10-29 LAB — GLUCOSE, CAPILLARY
Glucose-Capillary: 91 mg/dL (ref 70–99)
Glucose-Capillary: 117 mg/dL — ABNORMAL HIGH (ref 70–99)
Glucose-Capillary: 90 mg/dL (ref 70–99)
Glucose-Capillary: 116 mg/dL — ABNORMAL HIGH (ref 70–99)

## 2023-10-29 MED ORDER — BISACODYL 10 MG RE SUPP
10.0000 mg | Freq: Once | RECTAL | Status: AC
  Administered 2023-10-29: 10 mg via RECTAL
  Filled 2023-10-29: qty 1

## 2023-10-29 NOTE — Progress Notes (Signed)
 [facility] Progress Note   Subjective:  Seen in room. Sleeping, rouses briefly. No new complaints.   Objective Vitals:   10/28/23 2320 10/29/23 0320 10/29/23 0527 10/29/23 0902  BP: (!) 179/91 (!) 168/90  (!) 191/89  Pulse: 86 87  86  Resp: 19 18  20   Temp: 97.9 F (36.6 C)  99.4 F (37.4 C) 97.9 F (36.6 C)  TempSrc: Axillary  Axillary Axillary  SpO2: 93% 92%  97%  Weight:   77 kg   Height:       Physical Exam General: Lying in bed, nad  Heart: RRR No m,r,g Lungs: Clear bilaterally, normal wob Abdomen: Soft, non-distended  Extremities: No edema b/l lower extremities Dialysis Access: R internal jugular [facility]  Additional Objective Labs: Basic Metabolic Panel: Recent Labs  Lab 10/27/23 0520 10/28/23 0400 10/29/23 0354  NA 131* 132* 129*  K 4.0 4.3 4.0  CL 91* 97* 94*  CO2 26 24 24   GLUCOSE 75 82 84  BUN 31* 21 27*  CREATININE 6.19* 4.32* 5.92*  CALCIUM  7.4* 8.0* 7.9*  PHOS 5.1* 3.4 4.0   Liver Function Tests: Recent Labs  Lab 10/24/23 1125 10/25/23 0521 10/27/23 0520 10/28/23 0400 10/29/23 0354  AST 18  --   --   --   --   ALT 14  --   --   --   --   ALKPHOS 123  --   --   --   --   BILITOT 0.4  --   --   --   --   PROT 7.5  --   --   --   --   ALBUMIN  3.1*   < > 2.7* 2.9* 2.8*   < > = values in this interval not displayed.   No results for input(s): LIPASE, AMYLASE in the last 168 hours. CBC: Recent Labs  Lab 10/24/23 1220 10/25/23 0521 10/27/23 0821  WBC 7.9 7.6 8.2  HGB 10.0* 9.8* 8.8*  HCT 28.9* 28.4* 26.4*  MCV 83.0 82.8 85.2  PLT 442* 400 396   Blood Culture    Component Value Date/Time   SDES BLOOD RIGHT HAND 06/22/2022 1332   SPECREQUEST  06/22/2022 1332    AEROBIC BOTTLE ONLY Blood Culture results may not be optimal due to an inadequate volume of blood received in culture bottles   CULT  06/22/2022 1332    NO GROWTH 5 DAYS Performed at [facility], [address] [place], [place] [address]     REPTSTATUS 06/27/2022 FINAL 06/22/2022 1332    Cardiac Enzymes: Recent Labs  Lab 10/24/23 1743  CKTOTAL 100   CBG: Recent Labs  Lab 10/28/23 0754 10/28/23 1155 10/28/23 1613 10/28/23 2115 10/29/23 0802  GLUCAP 83 108* 140* 127* 91   Iron  Studies: No results for input(s): IRON , TIBC, TRANSFERRIN, FERRITIN in the last 72 hours. @lablastinr3 @ Studies/Results: No results found.  Medications:   amitriptyline   50 mg Oral QHS   aspirin  EC  81 mg Oral Daily   bisacodyl   10 mg Rectal Once   calcitRIOL   0.5 mcg Oral Q M,W,F   Chlorhexidine  Gluconate Cloth  6 each Topical Q0600   cinacalcet   30 mg Oral Q breakfast   diclofenac  Sodium  2 g Topical QID   gabapentin   300 mg Oral QHS   heparin  injection (subcutaneous)  5,000 Units Subcutaneous Q8H   heparin  sodium (porcine)  3,800 Units Intracatheter Once   hydrALAZINE   75 mg Oral TID   insulin   aspart  0-5 Units Subcutaneous QHS   insulin  aspart  0-9 Units Subcutaneous TID WC   levETIRAcetam   375 mg Oral Q M,W,F-1800   levETIRAcetam   750 mg Oral QHS   loratadine   10 mg Oral Daily   losartan   50 mg Oral BID   melatonin  5 mg Oral QHS   mometasone -formoterol   2 puff Inhalation BID   pantoprazole   40 mg Oral QHS   rosuvastatin   20 mg Oral QHS   sevelamer  carbonate  0.8 g Oral TID with meals   sodium chloride  flush  3 mL Intravenous Q12H   ticagrelor   90 mg Oral BID    Dialysis Orders: Center: [facility] M[facility]   4h 350/1.5 EDW 78.9 kg  2/2 bath RIJ TDC  Heparin  2600 -profile 2 - last HD 12/31, post wt 80.4 kg   - hectorol  IV 4 mcg tiw - venofer  100mg  tiw thru 4/24 - mircera 200 mcg IV q 2 wks, due 10/27/23  Assessment/Plan: Chest pain - troponins flat, likely demand ischemia.  Likely musculoskeletal source of pain given reproduction with palpation.  ACS ruled out.  ESRD -  HD MWF. Continue on schedule. Next HD 1/6 Hypertension/volume  -BP variable- back up today.  Recommend restarting low dose carvedilol .  Under dry weight.  Net UF 2.5L Post wt 77.1 kg on 10/27/23 Anemia  - Hgb 8.8. Due for esa - Received Aranesp  150 on 1/4.  Metabolic bone disease -  Calcium  and phos controlled, continue with home meds Nutrition - renal diet, carb modified Diabetes Mellitus type 2 - per primary svc Dispo - For SNF placement    Graciela Plato Ronnald Acosta PA-C [facility] 10/29/2023,9:28 AM

## 2023-10-29 NOTE — Progress Notes (Signed)
 PROGRESS NOTE    Carolyn Russo  FMW:[id number] DOB: April 21, 1951 DOA: 10/24/2023 PCP: Karlene Mungo, MD   Brief Narrative:  HPI: Carolyn Russo is a 73 y.o. female with medical history significant of ESRD on HD, CHF, CVA with residual left-sided weakness, right BKA, seizure disorder, diabetes mellitus type 2 who presents  with sudden onset of severe chest and back pain on the left side during her dialysis session this morning.  Most of history is obtained from the patient's husband who is present at bedside.  The pain was accompanied by severe nausea, but no vomiting or sweating was reported. The patient had been attending dialysis sessions regularly, three times a week, and had not missed any recent sessions.  Patient does report having some shortness of breath at current time.  Denies having any significant cough.   In the past few weeks, the patient has been experiencing increased agitation, difficulty sleeping, and elevated blood pressure. The patient's spouse reported that these symptoms began after the cessation of a beta blocker medication due to concerns about slow pulses and pauses in the heart rate.   The patient also reported a history of amputation, and has been experiencing increased difficulty with mobility and associated pain. The patient's spouse suggested that this could be due to a lack of exercise and poor posture.   The patient has a history of diabetes, which has been well-managed. The patient also reported to have undergone  recent mammogram, after which seem to onset her complaints of chest pain. A subsequent chest x-ray was negative.   The patient's spouse also reported concerns about possible dementia, as the patient has been repeating herself and seeming not to understand instructions. The patient requested a urinary tract infection test, as she still produces urine.    Upon admission into the emergency department patient was noted to be afebrile with blood pressures  elevated at 177/107, and O2 saturations currently maintained on room air.  Labs significant for hemoglobin 10, platelets 442, sodium 132, potassium 3.3, BUN 19, creatinine 4.01, and high-sensitivity troponin 66.  Chest x-ray normal unchanged from prior imaging from 02/12/23 mild pulmonary vascular congestion.  Patient had been given fentanyl  50 mcg IV and full dose aspirin   Assessment & Plan:   Principal Problem:   Chest pain Active Problems:   Elevated troponin   ESRD on dialysis (HCC)   Hypertensive urgency   Hypokalemia   Type 2 diabetes mellitus (HCC)   History of CVA with residual deficit   Memory loss   Primary insomnia   PAD (peripheral artery disease) (HCC)   Seizure disorder (HCC)   Dyslipidemia (high LDL; low HDL)   GERD (gastroesophageal reflux disease)  Chest pain / Elevated troponin, POA: Confirmed with the husband, patient has been complaining of chest pain as well as shoulder pain at times recently but intermittently.  Troponins are only slightly elevated and flat, consistent with only demand ischemia in the setting of ESRD.  ACS ruled out.  Her major complaint is shoulder pain and back pain.  I think this is likely muscular secondary to positional as she is often in 1 position due to her left hemiparesis.  Discussed with the husband, will avoid muscle relaxants so we can avoid delirium.  She can take Tylenol .  Husband will ensure that he discussed with the nurses to change positions frequently.   ESRD on HD Typical dialysis days are Monday Wednesday Friday but has been changed due to holidays, per husband.  She received  2 hours of dialysis Tuesday instead of 3 hours due to chest pain.  Nephrology consulted, she had short duration of dialysis again on 10/25/2023 and following that, she is receiving routine dialysis.  Appreciate nephrology help.   Hypertensive urgency On admission blood pressures were noted to be elevated up to 177/107.  Patient's husband notes that blood  pressures have been elevated ever since Coreg  was discontinued due to bradycardia. Per husband, patient on losartan  50 mg twice daily and hydralazine  50 mg twice daily.  However patient was started on losartan  50 once daily.  Blood pressure still elevated today.  Discussed with the husband, increased losartan  back to 50 twice daily and increasing hydralazine  to 75 mg 3 times daily.  Blood pressure was fairly controlled and in fact was lower at times but today, she has higher readings.  Discussed with husband, we will monitor and if she continues to have high readings, we will increase hydralazine  to 100 mg 3 times daily tomorrow morning.   Hypokalemia Replenished and resolved.   Controlled diabetes mellitus type 2, with long-term use of insulin  On admission glucose noted to be 126.  At home patient on a very sensitive SSI. -Hypoglycemic protocols -CBGs before every meal with very sensitive SSI -Adjust regimen as needed.   History of CVA with residual deficit Following prior stroke patient is residual left-sided deficits.  Patient finished outpatient PT recently, has been requesting PT OT evaluation here.  I have ordered that.   Memory loss Patient has been noticed that over the last couple of weeks her memory has progressively declined.  She has been repeating things and often getting agitated at night.  He has concerns for dementia, but she has not been formally tested. -Delirium precautions.  Patient currently fully alert and oriented. -May warrant Seroquel  overnight if noted to have significant agitation -Recommend outpatient referral to neurology   Insomnia Patient's husband notes that she previously had been on trazodone  for issues with insomnia at night, but was recently switched to amitriptyline . -Continue amitriptyline .  Consider increasing dose -Melatonin added nightly.   Peripheral vascular disease Status post right BKA   History of seizures -Continue Keppra     Dyslipidemia -Continue Crestor    GERD -Continue Protonix    Obesity BMI 31.24 kg/m  Generalized weakness/deconditioning: Assessed by PT OT per husband's request.  They recommend SNF, TOC working on placement.  DVT prophylaxis: heparin  injection 5,000 Units Start: 10/24/23 2200 SCDs Start: 10/24/23 1538   Code Status: Full Code  Family Communication: None present at bedside.  Discussed with the husband over the phone.  Status is: Observation The patient will require care spanning > 2 midnights and should be moved to inpatient because: Getting dialysis today and blood pressure still elevated needs adjustment of medications.   Estimated body mass index is 30.07 kg/m as calculated from the following:   Height as of this encounter: 5' 3 (1.6 m).   Weight as of this encounter: 77 kg.    Nutritional Assessment: Body mass index is 30.07 kg/m.Carolyn Russo Seen by dietician.  I agree with the assessment and plan as outlined below: Nutrition Status:        . Skin Assessment: I have examined the patient's skin and I agree with the wound assessment as performed by the wound care RN as outlined below:    Consultants:  Nephrology  Procedures:  None  Antimicrobials:  Anti-infectives (From admission, onward)    None         Subjective: Seen and examined.  Was complaining of low back pain and left shoulder pain.  No other complaint.  She was fully alert and oriented.  Objective: Vitals:   10/28/23 2320 10/29/23 0320 10/29/23 0527 10/29/23 0902  BP: (!) 179/91 (!) 168/90  (!) 191/89  Pulse: 86 87  86  Resp: 19 18  20   Temp: 97.9 F (36.6 C)  99.4 F (37.4 C) 97.9 F (36.6 C)  TempSrc: Axillary  Axillary Axillary  SpO2: 93% 92%  97%  Weight:   77 kg   Height:       No intake or output data in the 24 hours ending 10/29/23 1255  Filed Weights   10/27/23 1207 10/28/23 0400 10/29/23 0527  Weight: 76.5 kg 77.1 kg 77 kg    Examination:  General exam: Appears calm and  comfortable  Respiratory system: Clear to auscultation. Respiratory effort normal. Cardiovascular system: S1 & S2 heard, RRR. No JVD, murmurs, rubs, gallops or clicks. No pedal edema. Gastrointestinal system: Abdomen is nondistended, soft and nontender. No organomegaly or masses felt. Normal bowel sounds heard. Central nervous system: Alert and oriented.  Dysarthria and left hemiparesis, both known secondary to previous stroke. Extremities: Right BKA Skin: No rashes, lesions or ulcers.    Data Reviewed: I have personally reviewed following labs and imaging studies  CBC: Recent Labs  Lab 10/24/23 1220 10/25/23 0521 10/27/23 0821  WBC 7.9 7.6 8.2  HGB 10.0* 9.8* 8.8*  HCT 28.9* 28.4* 26.4*  MCV 83.0 82.8 85.2  PLT 442* 400 396   Basic Metabolic Panel: Recent Labs  Lab 10/25/23 0521 10/26/23 0414 10/27/23 0520 10/28/23 0400 10/29/23 0354  NA 132* 132* 131* 132* 129*  K 3.9 3.5 4.0 4.3 4.0  CL 94* 94* 91* 97* 94*  CO2 25 27 26 24 24   GLUCOSE 88 68* 75 82 84  BUN 24* 18 31* 21 27*  CREATININE 5.01* 4.57* 6.19* 4.32* 5.92*  CALCIUM  8.5* 8.1* 7.4* 8.0* 7.9*  PHOS 3.9 4.1 5.1* 3.4 4.0   GFR: Estimated Creatinine Clearance: 8.2 mL/min (A) (by C-G formula based on SCr of 5.92 mg/dL (H)). Liver Function Tests: Recent Labs  Lab 10/24/23 1125 10/25/23 0521 10/26/23 0414 10/27/23 0520 10/28/23 0400 10/29/23 0354  AST 18  --   --   --   --   --   ALT 14  --   --   --   --   --   ALKPHOS 123  --   --   --   --   --   BILITOT 0.4  --   --   --   --   --   PROT 7.5  --   --   --   --   --   ALBUMIN  3.1* 2.9* 2.8* 2.7* 2.9* 2.8*   No results for input(s): LIPASE, AMYLASE in the last 168 hours. No results for input(s): AMMONIA in the last 168 hours. Coagulation Profile: No results for input(s): INR, PROTIME in the last 168 hours. Cardiac Enzymes: Recent Labs  Lab 10/24/23 1743  CKTOTAL 100   BNP (last 3 results) No results for input(s): PROBNP in the last  8760 hours. HbA1C: No results for input(s): HGBA1C in the last 72 hours.  CBG: Recent Labs  Lab 10/28/23 1155 10/28/23 1613 10/28/23 2115 10/29/23 0802 10/29/23 1154  GLUCAP 108* 140* 127* 91 117*   Lipid Profile: No results for input(s): CHOL, HDL, LDLCALC, TRIG, CHOLHDL, LDLDIRECT in the last 72 hours. Thyroid  Function Tests: No results  for input(s): TSH, T4TOTAL, FREET4, T3FREE, THYROIDAB in the last 72 hours. Anemia Panel: No results for input(s): VITAMINB12, FOLATE, FERRITIN, TIBC, IRON , RETICCTPCT in the last 72 hours. Sepsis Labs: No results for input(s): PROCALCITON, LATICACIDVEN in the last 168 hours.  Recent Results (from the past 240 hours)  MRSA Next Gen by PCR, Nasal     Status: None   Collection Time: 10/28/23  4:03 AM   Specimen: Nasal Mucosa; Nasal Swab  Result Value Ref Range Status   MRSA by PCR Next Gen NOT DETECTED NOT DETECTED Final    Comment: (NOTE) The GeneXpert MRSA Assay (FDA approved for NASAL specimens only), is one component of a comprehensive MRSA colonization surveillance program. It is not intended to diagnose MRSA infection nor to guide or monitor treatment for MRSA infections. Test performance is not FDA approved in patients less than 39 years old. Performed at [facility], [address], [place], [place] [address]      Radiology Studies: No results found.   Scheduled Meds:  amitriptyline   50 mg Oral QHS   aspirin  EC  81 mg Oral Daily   bisacodyl   10 mg Rectal Once   calcitRIOL   0.5 mcg Oral Q M,W,F   Chlorhexidine  Gluconate Cloth  6 each Topical Q0600   cinacalcet   30 mg Oral Q breakfast   diclofenac  Sodium  2 g Topical QID   gabapentin   300 mg Oral QHS   heparin  injection (subcutaneous)  5,000 Units Subcutaneous Q8H   heparin  sodium (porcine)  3,800 Units Intracatheter Once   hydrALAZINE   75 mg Oral TID   insulin  aspart  0-5 Units Subcutaneous QHS   insulin  aspart  0-9 Units  Subcutaneous TID WC   levETIRAcetam   375 mg Oral Q M,W,F-1800   levETIRAcetam   750 mg Oral QHS   loratadine   10 mg Oral Daily   losartan   50 mg Oral BID   melatonin  5 mg Oral QHS   mometasone -formoterol   2 puff Inhalation BID   pantoprazole   40 mg Oral QHS   rosuvastatin   20 mg Oral QHS   sevelamer  carbonate  0.8 g Oral TID with meals   sodium chloride  flush  3 mL Intravenous Q12H   ticagrelor   90 mg Oral BID   Continuous Infusions:   LOS: 0 days   Fredia Skeeter, MD [facility]  10/29/2023, 12:55 PM   *Please note that this is a verbal dictation therefore any spelling or grammatical errors are due to the Dragon Medical One system interpretation.  Please page via Amion and do not message via secure chat for urgent patient care matters. Secure chat can be used for non urgent patient care matters.  How to contact the [facility] Attending or Consulting provider 7A - 7P or covering provider during after hours 7P -7A, for this patient?  Check the care team in [facility] and look for a) attending/consulting [facility] provider listed and b) the [facility] team listed. Page or secure chat 7A-7P. Log into [email or web address] and use [facility] universal password to access. If you do not have the password, please contact the hospital operator. Locate the [facility] provider you are looking for under [facility] and page to a number that you can be directly reached. If you still have difficulty reaching the provider, please page the [facility] (Director on Call) for the Hospitalists listed on amion for assistance.

## 2023-10-29 NOTE — TOC Progression Note (Addendum)
 Transition of Care [facility]) - Progression Note    Patient Details  Name: Carolyn Russo MRN: [id number] Date of Birth: [birth year]  Transition of Care [facility]) CM/SW Contact  Arlana JINNY Nicholaus [place] Phone Number: [phone] 10/29/2023, 9:42 AM  Clinical Narrative:  CSW called and spoke with pts husband Dr. [name] about SNF choice. Dr. [name] stated that he is still doing some investigating and driving by a few facilities today to see what he is interested in and will call CSW back. CSW explained once they make a decision ins shara will be started.   Pts  husband called the CSW back and stated that out of he four options he is most interested in [facility] SNF. Pt stated that he does not want to start ins auth until he physically gets to the facility to check things out and the will notify the CSWif he approves.   TOC will continue following.     Expected Discharge Plan:  (TBD SNF vrs. going home with hh services) Barriers to Discharge: Continued Medical Work up  Expected Discharge Plan and Services In-house Referral: Clinical Social Work Discharge Planning Services: CM Consult Post Acute Care Choice: Home Health Living arrangements for the past 2 months: Single Family Home Expected Discharge Date: 10/26/23                                     Social Determinants of Health (SDOH) Interventions SDOH Screenings   Food Insecurity: No Food Insecurity (10/25/2023)  Housing: Unknown (10/25/2023)  Transportation Needs: No Transportation Needs (10/25/2023)  Utilities: Not At Risk (10/25/2023)  Alcohol Screen: Low Risk  (01/09/2019)  Depression (PHQ2-9): Low Risk  (09/11/2023)  Financial Resource Strain: Low Risk  (09/02/2021)  Physical Activity: Inactive (09/02/2021)  Social Connections: Socially Integrated (10/25/2023)  Stress: Stress Concern Present (09/02/2021)  Tobacco Use: Low Risk  (10/25/2023)    Readmission Risk Interventions    02/14/2023    4:54 PM  Readmission Risk Prevention Plan   Transportation Screening Complete  PCP or Specialist Appt within 3-5 Days Complete  HRI or Home Care Consult Complete  Social Work Consult for Recovery Care Planning/Counseling Complete  Palliative Care Screening Complete  Medication Review Oceanographer) Complete

## 2023-10-30 DIAGNOSIS — R079 Chest pain, unspecified: Secondary | ICD-10-CM | POA: Diagnosis not present

## 2023-10-30 LAB — RENAL FUNCTION PANEL
Albumin: 2.8 g/dL — ABNORMAL LOW (ref 3.5–5.0)
Anion gap: 13 (ref 5–15)
BUN: 34 mg/dL — ABNORMAL HIGH (ref 8–23)
CO2: 23 mmol/L (ref 22–32)
Calcium: 8 mg/dL — ABNORMAL LOW (ref 8.9–10.3)
Chloride: 93 mmol/L — ABNORMAL LOW (ref 98–111)
Creatinine, Ser: 7.22 mg/dL — ABNORMAL HIGH (ref 0.44–1.00)
GFR, Estimated: 6 mL/min — ABNORMAL LOW (ref >60)
Glucose, Bld: 95 mg/dL (ref 70–99)
Phosphorus: 5.2 mg/dL — ABNORMAL HIGH (ref 2.5–4.6)
Potassium: 4.4 mmol/L (ref 3.5–5.1)
Sodium: 129 mmol/L — ABNORMAL LOW (ref 135–145)

## 2023-10-30 LAB — GLUCOSE, CAPILLARY
Glucose-Capillary: 82 mg/dL (ref 70–99)
Glucose-Capillary: 108 mg/dL — ABNORMAL HIGH (ref 70–99)
Glucose-Capillary: 100 mg/dL — ABNORMAL HIGH (ref 70–99)
Glucose-Capillary: 107 mg/dL — ABNORMAL HIGH (ref 70–99)
Glucose-Capillary: 97 mg/dL (ref 70–99)

## 2023-10-30 MED ORDER — PROSOURCE PLUS PO LIQD
30.0000 mL | Freq: Two times a day (BID) | ORAL | Status: DC
  Administered 2023-10-30 – 2023-11-01 (×4): 30 mL via ORAL
  Filled 2023-10-30 (×4): qty 30

## 2023-10-30 MED ORDER — ANTICOAGULANT SODIUM CITRATE 4% (200MG/5ML) IV SOLN
5.0000 mL | Status: DC | PRN
Start: 2023-10-30 — End: 2023-10-31
  Administered 2023-10-31: 3.8 mL
  Filled 2023-10-30: qty 3.8

## 2023-10-30 MED ORDER — LIDOCAINE HCL (PF) 1 % IJ SOLN: 5.0000 mL | INTRAMUSCULAR | Status: DC | PRN

## 2023-10-30 MED ORDER — PENTAFLUOROPROP-TETRAFLUOROETH EX AERO: 1.0000 | INHALATION_SPRAY | CUTANEOUS | Status: DC | PRN

## 2023-10-30 MED ORDER — DARBEPOETIN ALFA 200 MCG/0.4ML IJ SOSY: 200.0000 ug | PREFILLED_SYRINGE | INTRAMUSCULAR | Status: DC

## 2023-10-30 MED ORDER — DARBEPOETIN ALFA 200 MCG/0.4ML IJ SOSY
200.0000 ug | PREFILLED_SYRINGE | INTRAMUSCULAR | Status: DC
  Filled 2023-10-30: qty 0.4

## 2023-10-30 MED ORDER — OXYCODONE HCL 5 MG PO TABS
5.0000 mg | ORAL_TABLET | Freq: Four times a day (QID) | ORAL | Status: DC | PRN
  Administered 2023-10-30 (×2): 5 mg via ORAL
  Filled 2023-10-30 (×2): qty 1

## 2023-10-30 MED ORDER — LIDOCAINE-PRILOCAINE 2.5-2.5 % EX CREA: 1.0000 | TOPICAL_CREAM | CUTANEOUS | Status: DC | PRN

## 2023-10-30 MED ORDER — HYDRALAZINE HCL 50 MG PO TABS
100.0000 mg | ORAL_TABLET | Freq: Three times a day (TID) | ORAL | Status: DC
  Administered 2023-10-30 – 2023-10-31 (×5): 100 mg via ORAL
  Filled 2023-10-30 (×7): qty 2

## 2023-10-30 MED ORDER — ALTEPLASE 2 MG IJ SOLR: 2.0000 mg | Freq: Once | INTRAMUSCULAR | Status: DC | PRN

## 2023-10-30 NOTE — Progress Notes (Signed)
 PROGRESS NOTE    Carolyn Russo  FMW:[id number] DOB: [birth year] DOA: 10/24/2023 PCP: Karlene Mungo, MD   Brief Narrative:  HPI: Carolyn Russo is a 73 y.o. female with medical history significant of ESRD on HD, CHF, CVA with residual left-sided weakness, right BKA, seizure disorder, diabetes mellitus type 2 who presents  with sudden onset of severe chest and back pain on the left side during her dialysis session this morning.  Most of history is obtained from the patient's husband who is present at bedside.  The pain was accompanied by severe nausea, but no vomiting or sweating was reported. The patient had been attending dialysis sessions regularly, three times a week, and had not missed any recent sessions.  Patient does report having some shortness of breath at current time.  Denies having any significant cough.   In the past few weeks, the patient has been experiencing increased agitation, difficulty sleeping, and elevated blood pressure. The patient's spouse reported that these symptoms began after the cessation of a beta blocker medication due to concerns about slow pulses and pauses in the heart rate.   The patient also reported a history of amputation, and has been experiencing increased difficulty with mobility and associated pain. The patient's spouse suggested that this could be due to a lack of exercise and poor posture.   The patient has a history of diabetes, which has been well-managed. The patient also reported to have undergone  recent mammogram, after which seem to onset her complaints of chest pain. A subsequent chest x-ray was negative.   The patient's spouse also reported concerns about possible dementia, as the patient has been repeating herself and seeming not to understand instructions. The patient requested a urinary tract infection test, as she still produces urine.    Upon admission into the emergency department patient was noted to be afebrile with blood pressures  elevated at 177/107, and O2 saturations currently maintained on room air.  Labs significant for hemoglobin 10, platelets 442, sodium 132, potassium 3.3, BUN 19, creatinine 4.01, and high-sensitivity troponin 66.  Chest x-ray normal unchanged from prior imaging from 02/12/23 mild pulmonary vascular congestion.  Patient had been given fentanyl  50 mcg IV and full dose aspirin   Assessment & Plan:   Principal Problem:   Chest pain Active Problems:   Elevated troponin   ESRD on dialysis (HCC)   Hypertensive urgency   Hypokalemia   Type 2 diabetes mellitus (HCC)   History of CVA with residual deficit   Memory loss   Primary insomnia   PAD (peripheral artery disease) (HCC)   Seizure disorder (HCC)   Dyslipidemia (high LDL; low HDL)   GERD (gastroesophageal reflux disease)  Chest pain / Elevated troponin, POA: Confirmed with the husband, patient has been complaining of chest pain as well as shoulder pain at times recently but intermittently.  Troponins are only slightly elevated and flat, consistent with only demand ischemia in the setting of ESRD.  ACS ruled out.  Her major complaint is shoulder pain and back pain.  I think this is likely muscular secondary to positional as she is often in 1 position due to her left hemiparesis.  Discussed with the husband, will avoid muscle relaxants so we can avoid delirium.  Tylenol  on board is not working for her.  She is still requesting to give her something extra.  I have prescribed low-dose oxycodone  as needed.   ESRD on HD Typical dialysis days are Monday Wednesday Friday but has been changed  due to holidays, per husband.  She received 2 hours of dialysis Tuesday instead of 3 hours due to chest pain.  Nephrology consulted, she had short duration of dialysis again on 10/25/2023 and following that, she is receiving routine dialysis.  Appreciate nephrology help.   Hypertensive urgency On admission blood pressures were noted to be elevated up to 177/107.   Patient's husband notes that blood pressures have been elevated ever since Coreg  was discontinued due to bradycardia. Per husband, patient on losartan  50 mg twice daily and hydralazine  50 mg twice daily.  However patient was started on losartan  50 once daily.  Blood pressure still elevated today.  Discussed with the husband, increased losartan  back to 50 twice daily and increasing hydralazine  to 75 mg 3 times daily.  Blood pressure was fairly controlled and in fact was lower at times but has been on the higher side since yesterday.  Pain could be contributing to this as well however we will increase hydralazine  a little bit to 100 mg 3 times daily.   Hypokalemia Replenished and resolved.   Controlled diabetes mellitus type 2, with long-term use of insulin  On admission glucose noted to be 126.  At home patient on a very sensitive SSI. -Hypoglycemic protocols -CBGs before every meal with very sensitive SSI -Adjust regimen as needed.   History of CVA with residual deficit Following prior stroke patient is residual left-sided deficits.  Patient finished outpatient PT recently, has been requesting PT OT evaluation here.  I have ordered that.   Memory loss Patient has been noticed that over the last couple of weeks her memory has progressively declined.  She has been repeating things and often getting agitated at night.  He has concerns for dementia, but she has not been formally tested. -Delirium precautions.  Patient currently fully alert and oriented. -May warrant Seroquel  overnight if noted to have significant agitation -Recommend outpatient referral to neurology   Insomnia Patient's husband notes that she previously had been on trazodone  for issues with insomnia at night, but was recently switched to amitriptyline . -Continue amitriptyline .  Consider increasing dose -Melatonin added nightly.   Peripheral vascular disease Status post right BKA   History of seizures -Continue Keppra     Dyslipidemia -Continue Crestor    GERD -Continue Protonix    Obesity BMI 31.24 kg/m  Generalized weakness/deconditioning: Assessed by PT OT per husband's request.  They recommend SNF, TOC working on placement.  DVT prophylaxis: heparin  injection 5,000 Units Start: 10/24/23 2200 SCDs Start: 10/24/23 1538   Code Status: Full Code  Family Communication: None present at bedside.    Status is: Observation The patient will require care spanning > 2 midnights and should be moved to inpatient because: Getting dialysis today and blood pressure still elevated needs adjustment of medications.   Estimated body mass index is 30.11 kg/m as calculated from the following:   Height as of this encounter: 5' 3 (1.6 m).   Weight as of this encounter: 77.1 kg.    Nutritional Assessment: Body mass index is 30.11 kg/m.SABRA Seen by dietician.  I agree with the assessment and plan as outlined below: Nutrition Status:        . Skin Assessment: I have examined the patient's skin and I agree with the wound assessment as performed by the wound care RN as outlined below:    Consultants:  Nephrology  Procedures:  None  Antimicrobials:  Anti-infectives (From admission, onward)    None         Subjective: Patient seen  and examined.  She was about to work with the PT.  She was fully alert and oriented.  When asked how her shoulder pain was, she said  terrible, when asked how her back pain was, she said terrible and when I asked her how her breathing was, she said  wonderful.  PT tech was at the bedside.  Objective: Vitals:   10/30/23 0355 10/30/23 0842 10/30/23 1153 10/30/23 1202  BP: (!) 156/71   (!) 187/93  Pulse: 85 83 94   Resp: 16 16 18    Temp: 97.9 F (36.6 C) (!) 97.2 F (36.2 C) 98.4 F (36.9 C)   TempSrc: Axillary Oral Oral   SpO2:  97% 95%   Weight: 77.1 kg     Height:        Intake/Output Summary (Last 24 hours) at 10/30/2023 1412 Last data filed at 10/30/2023  1145 Gross per 24 hour  Intake 360 ml  Output --  Net 360 ml    Filed Weights   10/28/23 0400 10/29/23 0527 10/30/23 0355  Weight: 77.1 kg 77 kg 77.1 kg    Examination:  General exam: Appears calm and comfortable  Respiratory system: Clear to auscultation. Respiratory effort normal. Cardiovascular system: S1 & S2 heard, RRR. No JVD, murmurs, rubs, gallops or clicks.  Right BKA Gastrointestinal system: Abdomen is nondistended, soft and nontender. No organomegaly or masses felt. Normal bowel sounds heard. Central nervous system: Alert and oriented.  Left hemiparesis from previous stroke with dysarthria. Skin: No rashes, lesions or ulcers.  Psychiatry: Judgement and insight appear normal. Mood & affect appropriate.     Data Reviewed: I have personally reviewed following labs and imaging studies  CBC: Recent Labs  Lab 10/24/23 1220 10/25/23 0521 10/27/23 0821  WBC 7.9 7.6 8.2  HGB 10.0* 9.8* 8.8*  HCT 28.9* 28.4* 26.4*  MCV 83.0 82.8 85.2  PLT 442* 400 396   Basic Metabolic Panel: Recent Labs  Lab 10/26/23 0414 10/27/23 0520 10/28/23 0400 10/29/23 0354 10/30/23 0329  NA 132* 131* 132* 129* 129*  K 3.5 4.0 4.3 4.0 4.4  CL 94* 91* 97* 94* 93*  CO2 27 26 24 24 23   GLUCOSE 68* 75 82 84 95  BUN 18 31* 21 27* 34*  CREATININE 4.57* 6.19* 4.32* 5.92* 7.22*  CALCIUM  8.1* 7.4* 8.0* 7.9* 8.0*  PHOS 4.1 5.1* 3.4 4.0 5.2*   GFR: Estimated Creatinine Clearance: 6.7 mL/min (A) (by C-G formula based on SCr of 7.22 mg/dL (H)). Liver Function Tests: Recent Labs  Lab 10/24/23 1125 10/25/23 0521 10/26/23 0414 10/27/23 0520 10/28/23 0400 10/29/23 0354 10/30/23 0329  AST 18  --   --   --   --   --   --   ALT 14  --   --   --   --   --   --   ALKPHOS 123  --   --   --   --   --   --   BILITOT 0.4  --   --   --   --   --   --   PROT 7.5  --   --   --   --   --   --   ALBUMIN  3.1*   < > 2.8* 2.7* 2.9* 2.8* 2.8*   < > = values in this interval not displayed.   No results  for input(s): LIPASE, AMYLASE in the last 168 hours. No results for input(s): AMMONIA in the last 168 hours. Coagulation  Profile: No results for input(s): INR, PROTIME in the last 168 hours. Cardiac Enzymes: Recent Labs  Lab 10/24/23 1743  CKTOTAL 100   BNP (last 3 results) No results for input(s): PROBNP in the last 8760 hours. HbA1C: No results for input(s): HGBA1C in the last 72 hours.  CBG: Recent Labs  Lab 10/29/23 1703 10/29/23 2058 10/30/23 0740 10/30/23 0837 10/30/23 1151  GLUCAP 90 116* 82 108* 100*   Lipid Profile: No results for input(s): CHOL, HDL, LDLCALC, TRIG, CHOLHDL, LDLDIRECT in the last 72 hours. Thyroid  Function Tests: No results for input(s): TSH, T4TOTAL, FREET4, T3FREE, THYROIDAB in the last 72 hours. Anemia Panel: No results for input(s): VITAMINB12, FOLATE, FERRITIN, TIBC, IRON , RETICCTPCT in the last 72 hours. Sepsis Labs: No results for input(s): PROCALCITON, LATICACIDVEN in the last 168 hours.  Recent Results (from the past 240 hours)  MRSA Next Gen by PCR, Nasal     Status: None   Collection Time: 10/28/23  4:03 AM   Specimen: Nasal Mucosa; Nasal Swab  Result Value Ref Range Status   MRSA by PCR Next Gen NOT DETECTED NOT DETECTED Final    Comment: (NOTE) The GeneXpert MRSA Assay (FDA approved for NASAL specimens only), is one component of a comprehensive MRSA colonization surveillance program. It is not intended to diagnose MRSA infection nor to guide or monitor treatment for MRSA infections. Test performance is not FDA approved in patients less than 43 years old. Performed at [facility], [address], [place], [place] [address]      Radiology Studies: No results found.   Scheduled Meds:  (feeding supplement) PROSource Plus  30 mL Oral BID BM   amitriptyline   50 mg Oral QHS   aspirin  EC  81 mg Oral Daily   calcitRIOL   0.5 mcg Oral Q M,W,F   Chlorhexidine  Gluconate  Cloth  6 each Topical Q0600   cinacalcet   30 mg Oral Q breakfast   darbepoetin (ARANESP ) injection - DIALYSIS  200 mcg Subcutaneous Q Mon-1800   diclofenac  Sodium  2 g Topical QID   gabapentin   300 mg Oral QHS   heparin  injection (subcutaneous)  5,000 Units Subcutaneous Q8H   heparin  sodium (porcine)  3,800 Units Intracatheter Once   hydrALAZINE   75 mg Oral TID   insulin  aspart  0-5 Units Subcutaneous QHS   insulin  aspart  0-9 Units Subcutaneous TID WC   levETIRAcetam   375 mg Oral Q M,W,F-1800   levETIRAcetam   750 mg Oral QHS   loratadine   10 mg Oral Daily   losartan   50 mg Oral BID   melatonin  5 mg Oral QHS   mometasone -formoterol   2 puff Inhalation BID   pantoprazole   40 mg Oral QHS   rosuvastatin   20 mg Oral QHS   sevelamer  carbonate  0.8 g Oral TID with meals   sodium chloride  flush  3 mL Intravenous Q12H   ticagrelor   90 mg Oral BID   Continuous Infusions:  anticoagulant sodium citrate        LOS: 0 days   Fredia Skeeter, MD [facility]  10/30/2023, 2:12 PM   *Please note that this is a verbal dictation therefore any spelling or grammatical errors are due to the Dragon Medical One system interpretation.  Please page via Amion and do not message via secure chat for urgent patient care matters. Secure chat can be used for non urgent patient care matters.  How to contact the [facility] Attending or Consulting provider 7A - 7P or covering provider  during after hours 7P -7A, for this patient?  Check the care team in [facility] and look for a) attending/consulting [facility] provider listed and b) the [facility] team listed. Page or secure chat 7A-7P. Log into [email or web address] and use [place]'s universal password to access. If you do not have the password, please contact the hospital operator. Locate the [facility] provider you are looking for under [facility] and page to a number that you can be directly reached. If you still have difficulty reaching the provider, please page the [facility] (Director on  Call) for the Hospitalists listed on amion for assistance.

## 2023-10-30 NOTE — Plan of Care (Signed)
  Problem: Education: Goal: Knowledge of General Education information will improve Description: Including pain rating scale, medication(s)/side effects and non-pharmacologic comfort measures Outcome: Progressing   Problem: Health Behavior/Discharge Planning: Goal: Ability to manage health-related needs will improve Outcome: Progressing   Problem: Clinical Measurements: Goal: Ability to maintain clinical measurements within normal limits will improve Outcome: Progressing Goal: Will remain free from infection Outcome: Progressing Goal: Diagnostic test results will improve Outcome: Progressing Goal: Respiratory complications will improve Outcome: Progressing Goal: Cardiovascular complication will be avoided Outcome: Progressing   Problem: Activity: Goal: Risk for activity intolerance will decrease Outcome: Progressing   Problem: Nutrition: Goal: Adequate nutrition will be maintained Outcome: Progressing   Problem: Coping: Goal: Level of anxiety will decrease Outcome: Progressing   Problem: Elimination: Goal: Will not experience complications related to bowel motility Outcome: Progressing Goal: Will not experience complications related to urinary retention Outcome: Progressing   Problem: Pain Management: Goal: General experience of comfort will improve Outcome: Progressing   Problem: Safety: Goal: Ability to remain free from injury will improve Outcome: Progressing   Problem: Skin Integrity: Goal: Risk for impaired skin integrity will decrease Outcome: Progressing   Problem: Education: Goal: Understanding of cardiac disease, CV risk reduction, and recovery process will improve Outcome: Progressing Goal: Individualized Educational Video(s) Outcome: Progressing   Problem: Activity: Goal: Ability to tolerate increased activity will improve Outcome: Progressing   Problem: Cardiac: Goal: Ability to achieve and maintain adequate cardiovascular perfusion will  improve Outcome: Progressing   Problem: Health Behavior/Discharge Planning: Goal: Ability to safely manage health-related needs after discharge will improve Outcome: Progressing   Problem: Education: Goal: Ability to describe self-care measures that may prevent or decrease complications (Diabetes Survival Skills Education) will improve Outcome: Progressing Goal: Individualized Educational Video(s) Outcome: Progressing   Problem: Coping: Goal: Ability to adjust to condition or change in health will improve Outcome: Progressing   Problem: Fluid Volume: Goal: Ability to maintain a balanced intake and output will improve Outcome: Progressing   Problem: Health Behavior/Discharge Planning: Goal: Ability to identify and utilize available resources and services will improve Outcome: Progressing Goal: Ability to manage health-related needs will improve Outcome: Progressing   Problem: Metabolic: Goal: Ability to maintain appropriate glucose levels will improve Outcome: Progressing   Problem: Nutritional: Goal: Maintenance of adequate nutrition will improve Outcome: Progressing Goal: Progress toward achieving an optimal weight will improve Outcome: Progressing   Problem: Skin Integrity: Goal: Risk for impaired skin integrity will decrease Outcome: Progressing   Problem: Tissue Perfusion: Goal: Adequacy of tissue perfusion will improve Outcome: Progressing

## 2023-10-30 NOTE — TOC Progression Note (Addendum)
 Transition of Care [facility]) - Progression Note    Patient Details  Name: Carolyn Russo MRN: [id number] Date of Birth: [birth year]  [facility] of [facility]) CM/SW Contact  Isaiah Public, LCSWA Phone Number: 10/30/2023, 10:33 AM  Clinical Narrative:     CSW spoke with patients spouse  who informed CSW he is going to go visit [facility] SNF shortly. Patients spouse informed CSW he will give a call back once able to visit facility. SNF choice pending. CSW will continue to follow.  Update- CSW received call back from patients spouse who informed CSW that he accepts SNF bed offer with [facility] for patient. CSW informed Britney with [facility]. Britney with [facility] confirmed SNF bed for patient. CSW added patients SNF choice to patients insurance authorization request. Patients insurance authorization currently pending.  Expected Discharge Plan:  (TBD SNF vrs. going home with hh services) Barriers to Discharge: Continued Medical Work up  Expected Discharge Plan and Services In-house Referral: Clinical Social Work Discharge Planning Services: CM Consult Post Acute Care Choice: Home Health Living arrangements for the past 2 months: Single Family Home Expected Discharge Date: 10/26/23                                     Social Determinants of Health (SDOH) Interventions SDOH Screenings   Food Insecurity: No Food Insecurity (10/25/2023)  Housing: Unknown (10/25/2023)  Transportation Needs: No Transportation Needs (10/25/2023)  Utilities: Not At Risk (10/25/2023)  Alcohol Screen: Low Risk  (01/09/2019)  Depression (PHQ2-9): Low Risk  (09/11/2023)  Financial Resource Strain: Low Risk  (09/02/2021)  Physical Activity: Inactive (09/02/2021)  Social Connections: Socially Integrated (10/25/2023)  Stress: Stress Concern Present (09/02/2021)  Tobacco Use: Low Risk  (10/25/2023)    Readmission Risk Interventions    02/14/2023    4:54 PM  Readmission Risk Prevention Plan  Transportation  Screening Complete  PCP or Specialist Appt within 3-5 Days Complete  HRI or Home Care Consult Complete  Social Work Consult for Recovery Care Planning/Counseling Complete  Palliative Care Screening Complete  Medication Review [employer]) Complete

## 2023-10-30 NOTE — Progress Notes (Signed)
 [facility] Progress Note   Subjective:   Seen in room - no CP today. For HD later today.  Objective Vitals:   10/30/23 0025 10/30/23 0355 10/30/23 0842 10/30/23 1153  BP: (!) 174/87 (!) 156/71    Pulse: 88 85 83 94  Resp: 20 16 16 18   Temp: 97.8 F (36.6 C) 97.9 F (36.6 C) (!) 97.2 F (36.2 C) 98.4 F (36.9 C)  TempSrc: Axillary Axillary Oral Oral  SpO2:   97% 95%  Weight:  77.1 kg    Height:       Physical Exam General: Elderly woman, NAD. Room air. Heart: RRR; no murmur Lungs: Scattered rhonchi Abdomen: soft Extremities: no LE edema Dialysis Access: [address]  Additional Objective Labs: Basic Metabolic Panel: Recent Labs  Lab 10/28/23 0400 10/29/23 0354 10/30/23 0329  NA 132* 129* 129*  K 4.3 4.0 4.4  CL 97* 94* 93*  CO2 24 24 23   GLUCOSE 82 84 95  BUN 21 27* 34*  CREATININE 4.32* 5.92* 7.22*  CALCIUM  8.0* 7.9* 8.0*  PHOS 3.4 4.0 5.2*   Liver Function Tests: Recent Labs  Lab 10/24/23 1125 10/25/23 0521 10/28/23 0400 10/29/23 0354 10/30/23 0329  AST 18  --   --   --   --   ALT 14  --   --   --   --   ALKPHOS 123  --   --   --   --   BILITOT 0.4  --   --   --   --   PROT 7.5  --   --   --   --   ALBUMIN  3.1*   < > 2.9* 2.8* 2.8*   < > = values in this interval not displayed.   CBC: Recent Labs  Lab 10/24/23 1220 10/25/23 0521 10/27/23 0821  WBC 7.9 7.6 8.2  HGB 10.0* 9.8* 8.8*  HCT 28.9* 28.4* 26.4*  MCV 83.0 82.8 85.2  PLT 442* 400 396   CBG: Recent Labs  Lab 10/29/23 1703 10/29/23 2058 10/30/23 0740 10/30/23 0837 10/30/23 1151  GLUCAP 90 116* 82 108* 100*   Medications:  anticoagulant sodium citrate       amitriptyline   50 mg Oral QHS   aspirin  EC  81 mg Oral Daily   calcitRIOL   0.5 mcg Oral Q M,W,F   Chlorhexidine  Gluconate Cloth  6 each Topical Q0600   cinacalcet   30 mg Oral Q breakfast   diclofenac  Sodium  2 g Topical QID   gabapentin   300 mg Oral QHS   heparin  injection (subcutaneous)  5,000 Units  Subcutaneous Q8H   heparin  sodium (porcine)  3,800 Units Intracatheter Once   hydrALAZINE   75 mg Oral TID   insulin  aspart  0-5 Units Subcutaneous QHS   insulin  aspart  0-9 Units Subcutaneous TID WC   levETIRAcetam   375 mg Oral Q M,W,F-1800   levETIRAcetam   750 mg Oral QHS   loratadine   10 mg Oral Daily   losartan   50 mg Oral BID   melatonin  5 mg Oral QHS   mometasone -formoterol   2 puff Inhalation BID   pantoprazole   40 mg Oral QHS   rosuvastatin   20 mg Oral QHS   sevelamer  carbonate  0.8 g Oral TID with meals   sodium chloride  flush  3 mL Intravenous Q12H   ticagrelor   90 mg Oral BID    Dialysis Orders MWF - NW 4hr, 350/A1.5, EDW 78.9kg, 2K/2Ca, TDC, UFP #2, heparin  2600 unit - Hectoral 4mcg IV  q HD - Getting course of IV Venofer  - Mircera 200mcg IV q 2 weeks  Assessment/Plan: Chest pain: Not ACS, flat troponins, ?demand ischemia v. MSK. ESRD: Continue HD on MWF schedule - for HD today. HTN/volume: BP high, continue home meds and challenge EDW, lower on discharge. Anemia of ESRD: Hgb 8.8 - restart Aranesp  today. Secondary HPTH: Ca/Phos ok - continue sevelamer  + VDRA. Nutrition: Alb low, adding supps. Dementia T2DM Dispo: SNF placement pending.   [name], PA-C 10/30/2023, 12:06 PM  [employer]

## 2023-10-30 NOTE — Progress Notes (Signed)
 Physical Therapy Treatment Patient Details Name: Carolyn Russo MRN: [id number] DOB: 08-18-1951 Today's Date: 10/30/2023   History of Present Illness Pt is 73 yo female admitted on 10/24/23 with chest pain during HD -per notes demand ischemia in setting of ESRD.  Pt with hx including but not limited to ESRD on HD MWF, CHF, CVA with L residual weakness, R BKA, seizure, DM2    PT Comments  Pt continues to struggle with mobility. Fatigued today and unable to achieve full standing with Stedy and +1 max assist. Continue to feel patient will benefit from continued inpatient follow up therapy, <3 hours/day.      If plan is discharge home, recommend the following: A lot of help with bathing/dressing/bathroom;Assistance with cooking/housework;Direct supervision/assist for medications management;Direct supervision/assist for financial management;Assist for transportation;Help with stairs or ramp for entrance   Can travel by private vehicle     No  Equipment Recommendations  Hoyer lift;BSC/3in1    Recommendations for Other Services       Precautions / Restrictions Precautions Precautions: Fall Restrictions Weight Bearing Restrictions Per Provider Order: No     Mobility  Bed Mobility Overal bed mobility: Needs Assistance Bed Mobility: Supine to Sit, Sit to Supine, Rolling Rolling: Max assist   Supine to sit: Max assist, HOB elevated, Used rails Sit to supine: Max assist   General bed mobility comments: Assist to bring legs off of bed, elevate trunk into sitting and bring hips to EOb. Assist to lower trunk and bring legs back up into bed returning to supine    Transfers Overall transfer level: Needs assistance Equipment used: Rolling walker (2 wheels) Transfers: Sit to/from Stand             General transfer comment: Attempted to stand from elevated bed with Stedy and 1 person max assist and pt unable to fully rise. Only able to bring buttocks ~6 off of bed. Attempted several  times without success. Pt incontinent of stool and returned to supine. Transfer via Lift Equipment: Stedy  Ambulation/Gait                   Stairs             Wheelchair Mobility     Tilt Bed    Modified Rankin (Stroke Patients Only)       Balance Overall balance assessment: Needs assistance Sitting-balance support: Bilateral upper extremity supported, Single extremity supported, Feet supported Sitting balance-Leahy Scale: Poor Sitting balance - Comments: UE support and pt intermittently needing min assist due to rt and posterior lean Postural control: Posterior lean, Right lateral lean                                  Cognition Arousal: Alert Behavior During Therapy: Flat affect, Anxious Overall Cognitive Status: No family/caregiver present to determine baseline cognitive functioning Area of Impairment: Attention, Following commands, Problem solving, Safety/judgement, Awareness                   Current Attention Level: Selective   Following Commands: Follows one step commands consistently, Follows one step commands with increased time, Follows multi-step commands inconsistently Safety/Judgement: Decreased awareness of safety, Decreased awareness of deficits Awareness: Emergent Problem Solving: Slow processing, Difficulty sequencing, Requires verbal cues, Requires tactile cues          Exercises      General Comments        Pertinent  Vitals/Pain Pain Assessment Pain Assessment: Faces Faces Pain Scale: Hurts even more Pain Location: rt shoulder and back Pain Descriptors / Indicators: Grimacing, Guarding, Moaning, Other (Comment) (I can't breathe) Pain Intervention(s): Limited activity within patient's tolerance, Monitored during session, Repositioned, Patient requesting pain meds-RN notified    Home Living                          Prior Function            PT Goals (current goals can now be found in the  care plan section) Acute Rehab PT Goals Patient Stated Goal: to get better and go home Progress towards PT goals: Not progressing toward goals - comment    Frequency    Min 1X/week      PT Plan      Co-evaluation              AM-PAC PT 6 Clicks Mobility   Outcome Measure  Help needed turning from your back to your side while in a flat bed without using bedrails?: A Lot Help needed moving from lying on your back to sitting on the side of a flat bed without using bedrails?: A Lot Help needed moving to and from a bed to a chair (including a wheelchair)?: Total Help needed standing up from a chair using your arms (e.g., wheelchair or bedside chair)?: Total Help needed to walk in hospital room?: Total Help needed climbing 3-5 steps with a railing? : Total 6 Click Score: 8    End of Session Equipment Utilized During Treatment: Gait belt Activity Tolerance: Patient limited by fatigue Patient left: with call bell/phone within reach;in bed;with bed alarm set   PT Visit Diagnosis: Other abnormalities of gait and mobility (R26.89);Muscle weakness (generalized) (M62.81);Unsteadiness on feet (R26.81);Difficulty in walking, not elsewhere classified (R26.2)     Time: 8987-8953 PT Time Calculation (min) (ACUTE ONLY): 34 min  Charges:    $Therapeutic Activity: 23-37 mins PT General Charges $$ ACUTE PT VISIT: 1 Visit                     [facility] PT Acute Rehabilitation Services Office [phone]    Rodgers ORN [facility] 10/30/2023, 1:52 PM

## 2023-10-31 DIAGNOSIS — R079 Chest pain, unspecified: Secondary | ICD-10-CM | POA: Diagnosis not present

## 2023-10-31 LAB — RENAL FUNCTION PANEL
Albumin: 2.8 g/dL — ABNORMAL LOW (ref 3.5–5.0)
Anion gap: 14 (ref 5–15)
BUN: 45 mg/dL — ABNORMAL HIGH (ref 8–23)
CO2: 23 mmol/L (ref 22–32)
Calcium: 7.4 mg/dL — ABNORMAL LOW (ref 8.9–10.3)
Chloride: 93 mmol/L — ABNORMAL LOW (ref 98–111)
Creatinine, Ser: 8.53 mg/dL — ABNORMAL HIGH (ref 0.44–1.00)
GFR, Estimated: 5 mL/min — ABNORMAL LOW (ref >60)
Glucose, Bld: 70 mg/dL (ref 70–99)
Phosphorus: 5.7 mg/dL — ABNORMAL HIGH (ref 2.5–4.6)
Potassium: 4.9 mmol/L (ref 3.5–5.1)
Sodium: 130 mmol/L — ABNORMAL LOW (ref 135–145)

## 2023-10-31 LAB — GLUCOSE, CAPILLARY
Glucose-Capillary: 74 mg/dL (ref 70–99)
Glucose-Capillary: 87 mg/dL (ref 70–99)
Glucose-Capillary: 66 mg/dL — ABNORMAL LOW (ref 70–99)
Glucose-Capillary: 112 mg/dL — ABNORMAL HIGH (ref 70–99)
Glucose-Capillary: 94 mg/dL (ref 70–99)
Glucose-Capillary: 139 mg/dL — ABNORMAL HIGH (ref 70–99)

## 2023-10-31 LAB — CBC
HCT: 25.3 % — ABNORMAL LOW (ref 36.0–46.0)
Hemoglobin: 8.5 g/dL — ABNORMAL LOW (ref 12.0–15.0)
MCH: 28.2 pg (ref 26.0–34.0)
MCHC: 33.6 g/dL (ref 30.0–36.0)
MCV: 84.1 fL (ref 80.0–100.0)
Platelets: 391 10*3/uL (ref 150–400)
RBC: 3.01 MIL/uL — ABNORMAL LOW (ref 3.87–5.11)
RDW: 18.2 % — ABNORMAL HIGH (ref 11.5–15.5)
WBC: 8.6 10*3/uL (ref 4.0–10.5)
nRBC: 0 % (ref 0.0–0.2)

## 2023-10-31 MED ORDER — CYCLOBENZAPRINE HCL 10 MG PO TABS
5.0000 mg | ORAL_TABLET | Freq: Three times a day (TID) | ORAL | Status: DC | PRN
  Administered 2023-10-31 – 2023-11-01 (×2): 5 mg via ORAL
  Filled 2023-10-31 (×4): qty 1

## 2023-10-31 MED ORDER — LEVETIRACETAM 250 MG PO TABS
250.0000 mg | ORAL_TABLET | Freq: Once | ORAL | Status: AC
  Administered 2023-10-31: 250 mg via ORAL
  Filled 2023-10-31 (×2): qty 1

## 2023-10-31 NOTE — Plan of Care (Signed)
  Problem: Education: Goal: Knowledge of General Education information will improve Description: Including pain rating scale, medication(s)/side effects and non-pharmacologic comfort measures Outcome: Progressing   Problem: Health Behavior/Discharge Planning: Goal: Ability to manage health-related needs will improve Outcome: Progressing   Problem: Clinical Measurements: Goal: Ability to maintain clinical measurements within normal limits will improve Outcome: Progressing Goal: Will remain free from infection Outcome: Progressing Goal: Diagnostic test results will improve Outcome: Progressing Goal: Respiratory complications will improve Outcome: Progressing Goal: Cardiovascular complication will be avoided Outcome: Progressing   Problem: Activity: Goal: Risk for activity intolerance will decrease Outcome: Progressing   Problem: Nutrition: Goal: Adequate nutrition will be maintained Outcome: Progressing   Problem: Coping: Goal: Level of anxiety will decrease Outcome: Progressing   Problem: Elimination: Goal: Will not experience complications related to bowel motility Outcome: Progressing Goal: Will not experience complications related to urinary retention Outcome: Progressing   Problem: Pain Management: Goal: General experience of comfort will improve Outcome: Progressing   Problem: Safety: Goal: Ability to remain free from injury will improve Outcome: Progressing   Problem: Skin Integrity: Goal: Risk for impaired skin integrity will decrease Outcome: Progressing   Problem: Education: Goal: Understanding of cardiac disease, CV risk reduction, and recovery process will improve Outcome: Progressing Goal: Individualized Educational Video(s) Outcome: Progressing   Problem: Activity: Goal: Ability to tolerate increased activity will improve Outcome: Progressing   Problem: Cardiac: Goal: Ability to achieve and maintain adequate cardiovascular perfusion will  improve Outcome: Progressing   Problem: Health Behavior/Discharge Planning: Goal: Ability to safely manage health-related needs after discharge will improve Outcome: Progressing   Problem: Education: Goal: Ability to describe self-care measures that may prevent or decrease complications (Diabetes Survival Skills Education) will improve Outcome: Progressing Goal: Individualized Educational Video(s) Outcome: Progressing   Problem: Coping: Goal: Ability to adjust to condition or change in health will improve Outcome: Progressing   Problem: Fluid Volume: Goal: Ability to maintain a balanced intake and output will improve Outcome: Progressing   Problem: Health Behavior/Discharge Planning: Goal: Ability to identify and utilize available resources and services will improve Outcome: Progressing Goal: Ability to manage health-related needs will improve Outcome: Progressing   Problem: Metabolic: Goal: Ability to maintain appropriate glucose levels will improve Outcome: Progressing   Problem: Nutritional: Goal: Maintenance of adequate nutrition will improve Outcome: Progressing Goal: Progress toward achieving an optimal weight will improve Outcome: Progressing   Problem: Skin Integrity: Goal: Risk for impaired skin integrity will decrease Outcome: Progressing   Problem: Tissue Perfusion: Goal: Adequacy of tissue perfusion will improve Outcome: Progressing

## 2023-10-31 NOTE — Progress Notes (Signed)
 OT Cancellation Note  Patient Details Name: Carolyn Russo MRN: [id number] DOB: [birth year]   Cancelled Treatment:    Reason Eval/Treat Not Completed: Patient at procedure or test/ unavailable (at HD will return as schedule allows and pt available.)  Kashvi Prevette D Walton, OTD, OTR/L [facility] Acute Rehabilitation Office: [phone]   Carolyn Russo 10/31/2023, 9:25 AM

## 2023-10-31 NOTE — Progress Notes (Signed)
 Occupational Therapy Treatment Patient Details Name: Carolyn Russo MRN: [id number] DOB: 1951/06/19 Today's Date: 10/31/2023   History of present illness Pt is 73 yo female admitted on 10/24/23 with chest pain during HD -per notes demand ischemia in setting of ESRD.  Pt with hx including but not limited to ESRD on HD MWF, CHF, CVA with L residual weakness, R BKA, seizure, DM2   OT comments  Pt supine in bed agreeable to OT session on arrival. Focus session on functional transfers and mobility. Pt able to perform bed mobility with mod A and STS transfer with mod A +2. Pt constantly calling out with L shoulder pain unable to be relieved. Pt with dizziness after standing. MAP reading 115. Deferred further mobility secondary to pt with max pain and unable to be redirected. Pt repositioned in bed with pillows to support shoulder and hot pack applied per pt request. RN aware. Will continue to follow.       If plan is discharge home, recommend the following:  A lot of help with bathing/dressing/bathroom;Two people to help with walking and/or transfers;Assistance with cooking/housework;Direct supervision/assist for medications management;Assistance with feeding;Direct supervision/assist for financial management;Assist for transportation;Help with stairs or ramp for entrance   Equipment Recommendations  Tub/shower bench    Recommendations for Other Services      Precautions / Restrictions Precautions Precautions: Fall Restrictions Weight Bearing Restrictions Per Provider Order: No       Mobility Bed Mobility Overal bed mobility: Needs Assistance Bed Mobility: Supine to Sit, Sit to Supine     Supine to sit: Max assist, HOB elevated, Used rails Sit to supine: Max assist   General bed mobility comments: assist for truncal elevation and scooting hips out toward EOB and then assist for all portions of return to supine    Transfers Overall transfer level: Needs assistance Equipment used:  Rolling walker (2 wheels) Transfers: Sit to/from Stand Sit to Stand: Mod assist, +2 physical assistance, +2 safety/equipment           General transfer comment: with RW. fair initiation of rise, but mod+2 to extend hips. stooped posture in standing     Balance Overall balance assessment: Needs assistance Sitting-balance support: Bilateral upper extremity supported, Single extremity supported, Feet supported Sitting balance-Leahy Scale: Poor     Standing balance support: Bilateral upper extremity supported, During functional activity, Reliant on assistive device for balance Standing balance-Leahy Scale: Poor Standing balance comment: Reliant on RW and modA, posterior lean noted                           ADL either performed or assessed with clinical judgement   ADL Overall ADL's : Needs assistance/impaired                     Lower Body Dressing: Sitting/lateral leans;Total assistance;Maximal assistance Lower Body Dressing Details (indicate cue type and reason): to don prosthesis and L shoe. Toilet Transfer: Moderate assistance;+2 for physical assistance;+2 for safety/equipment Toilet Transfer Details (indicate cue type and reason): STS this session                Extremity/Trunk Assessment Upper Extremity Assessment Upper Extremity Assessment: LUE deficits/detail LUE Deficits / Details: decreased initiation; ~15 degrees active shoulder flexion, 30 degrees shoulder abduction, ~100 degrees elbow flexion, Able to extend digits and make fist all with increased time. AAROm with RUE asisst and min A from OT to bring LUE up onto RW LUE  Sensation: decreased light touch   Lower Extremity Assessment Lower Extremity Assessment: Defer to PT evaluation        Vision       Perception     Praxis      Cognition Arousal: Alert Behavior During Therapy: Flat affect, Anxious Overall Cognitive Status: No family/caregiver present to determine baseline cognitive  functioning Area of Impairment: Attention, Following commands, Problem solving, Safety/judgement, Awareness                   Current Attention Level: Selective   Following Commands: Follows one step commands consistently, Follows one step commands with increased time, Follows multi-step commands inconsistently Safety/Judgement: Decreased awareness of safety, Decreased awareness of deficits Awareness: Emergent, Anticipatory Problem Solving: Slow processing, Difficulty sequencing, Requires verbal cues, Requires tactile cues General Comments: asks for assist prior to needing. max preoccupied with pain in her L shoulder at EOB        Exercises      Shoulder Instructions       General Comments      Pertinent Vitals/ Pain       Pain Assessment Pain Assessment: No/denies pain Faces Pain Scale: Hurts whole lot Pain Location: L shoulder and back Pain Descriptors / Indicators: Grimacing, Guarding, Moaning Pain Intervention(s): Limited activity within patient's tolerance, Monitored during session  Home Living                                          Prior Functioning/Environment              Frequency  Min 1X/week        Progress Toward Goals  OT Goals(current goals can now be found in the care plan section)  Progress towards OT goals: Progressing toward goals  Acute Rehab OT Goals Patient Stated Goal: go home OT Goal Formulation: With patient Time For Goal Achievement: 11/08/23 Potential to Achieve Goals: Good ADL Goals Pt Will Perform Grooming: with supervision;sitting Pt Will Perform Upper Body Dressing: with min assist;sitting Pt Will Transfer to Toilet: with min assist;stand pivot transfer  Plan      Co-evaluation                 AM-PAC OT 6 Clicks Daily Activity     Outcome Measure   Help from another person eating meals?: A Little Help from another person taking care of personal grooming?: A Little Help from another  person toileting, which includes using toliet, bedpan, or urinal?: A Lot Help from another person bathing (including washing, rinsing, drying)?: A Lot Help from another person to put on and taking off regular upper body clothing?: A Lot Help from another person to put on and taking off regular lower body clothing?: Total 6 Click Score: 13    End of Session Equipment Utilized During Treatment: Gait belt;Rolling walker (2 wheels);Other (comment) (RLE prosthesis)  OT Visit Diagnosis: Unsteadiness on feet (R26.81);Muscle weakness (generalized) (M62.81);Other symptoms and signs involving cognitive function;Hemiplegia and hemiparesis Hemiplegia - Right/Left: Left Hemiplegia - dominant/non-dominant: Non-Dominant Hemiplegia - caused by: Cerebral infarction   Activity Tolerance Patient tolerated treatment well;Patient limited by lethargy   Patient Left in bed;with call bell/phone within reach;with bed alarm set   Nurse Communication Mobility status (pain)        Time: 8578-8561 OT Time Calculation (min): 17 min  Charges: OT General Charges $OT Visit: 1 Visit OT Treatments $  Therapeutic Activity: 8-22 mins  Carolyn Russo, OTR/L [facility] Acute Rehabilitation Office: [phone]   Carolyn JONETTA Lebron 10/31/2023, 3:37 PM

## 2023-10-31 NOTE — Progress Notes (Signed)
 [facility] Progress Note   Subjective:   Seen on dialysis - tolerating fine. Patient resting comfortably but able to answer questions. Patient denies CP, SOB, N/V.   Objective Vitals:   10/31/23 0751 10/31/23 0810 10/31/23 0830 10/31/23 0900  BP: 137/75 137/75 133/86 (!) 144/78  Pulse: 73 73 70 71  Resp: 20 13 12 12   Temp: 97.9 F (36.6 C)     TempSrc: Oral     SpO2: 100% 100% 100% 100%  Weight:      Height:       Physical Exam General: Elderly woman, NAD. Room air. Heart: RRR; no murmur Lungs: Scattered rhonchi Abdomen: soft Extremities: 1+ LE edema  Dialysis Access: Rt internal jugular [facility]  Additional Objective Labs: Basic Metabolic Panel: Recent Labs  Lab 10/29/23 0354 10/30/23 0329 10/31/23 0508  NA 129* 129* 130*  K 4.0 4.4 4.9  CL 94* 93* 93*  CO2 24 23 23   GLUCOSE 84 95 70  BUN 27* 34* 45*  CREATININE 5.92* 7.22* 8.53*  CALCIUM  7.9* 8.0* 7.4*  PHOS 4.0 5.2* 5.7*   Liver Function Tests: Recent Labs  Lab 10/24/23 1125 10/25/23 0521 10/29/23 0354 10/30/23 0329 10/31/23 0508  AST 18  --   --   --   --   ALT 14  --   --   --   --   ALKPHOS 123  --   --   --   --   BILITOT 0.4  --   --   --   --   PROT 7.5  --   --   --   --   ALBUMIN  3.1*   < > 2.8* 2.8* 2.8*   < > = values in this interval not displayed.   CBC: Recent Labs  Lab 10/24/23 1220 10/25/23 0521 10/27/23 0821 10/31/23 0750  WBC 7.9 7.6 8.2 8.6  HGB 10.0* 9.8* 8.8* 8.5*  HCT 28.9* 28.4* 26.4* 25.3*  MCV 83.0 82.8 85.2 84.1  PLT 442* 400 396 391   Cardiac Enzymes: Recent Labs  Lab 10/24/23 1743  CKTOTAL 100   CBG: Recent Labs  Lab 10/30/23 1151 10/30/23 1617 10/30/23 2126 10/31/23 0646 10/31/23 0731  GLUCAP 100* 107* 97 74 87   Studies/Results: No results found. Medications:  anticoagulant sodium citrate       (feeding supplement) PROSource Plus  30 mL Oral BID BM   amitriptyline   50 mg Oral QHS   aspirin  EC  81 mg Oral Daily   calcitRIOL   0.5  mcg Oral Q M,W,F   Chlorhexidine  Gluconate Cloth  6 each Topical Q0600   cinacalcet   30 mg Oral Q breakfast   [START ON 11/04/2023] darbepoetin (ARANESP ) injection - DIALYSIS  200 mcg Subcutaneous Q Sat-1800   diclofenac  Sodium  2 g Topical QID   gabapentin   300 mg Oral QHS   heparin  injection (subcutaneous)  5,000 Units Subcutaneous Q8H   heparin  sodium (porcine)  3,800 Units Intracatheter Once   hydrALAZINE   100 mg Oral TID   insulin  aspart  0-5 Units Subcutaneous QHS   insulin  aspart  0-9 Units Subcutaneous TID WC   levETIRAcetam   375 mg Oral Q M,W,F-1800   levETIRAcetam   750 mg Oral QHS   loratadine   10 mg Oral Daily   losartan   50 mg Oral BID   melatonin  5 mg Oral QHS   mometasone -formoterol   2 puff Inhalation BID   pantoprazole   40 mg Oral QHS   rosuvastatin   20 mg Oral  QHS   sevelamer  carbonate  0.8 g Oral TID with meals   sodium chloride  flush  3 mL Intravenous Q12H   ticagrelor   90 mg Oral BID    Dialysis Orders MWF - NW 4hr, 350/A1.5, EDW 78.9kg, 2K/2Ca, TDC, UFP #2, heparin  2600 unit - Hectoral 4mcg IV q HD - Getting course of IV Venofer  - Mircera 200mcg IV q 2 weeks   Assessment/Plan: Chest pain: Resolved, work up for ACS negative. ESRD: Typically MWF schedule, was rolled over to today due to high census. Will restart MWF schedule tomorrow.  HTN/volume: BP slightly elevated, follow with dialysis. Continue home meds and challenge EDW, lower on discharge. Anemia of ESRD: Hgb 8.8 - continue Aranesp , will restart on 1/11. Secondary HPTH: Correct Ca ok. Phos elevated - continue sevelamer  + VDRA. Nutrition: Alb low, continue supps. Dementia T2DM Dispo: SNF placement pending.  Signe Sick, PA-S [name], PA-C 10/31/2023, 9:43 AM  [employer]

## 2023-10-31 NOTE — TOC Progression Note (Addendum)
 Transition of Care [facility]) - Progression Note    Patient Details  Name: Carolyn Russo MRN: [id number] Date of Birth: 1951/02/23  [facility] CM/SW Contact  Isaiah Public, LCSWA Phone Number: 10/31/2023, 12:45 PM  Clinical Narrative:     Patients insurance authorization has been approved Plan Auth ID# [id number] Auth ID# [id number]. Insurance authorization has been approved from 1/7-1/9. Patients spouse says he can transport patient to HD to and from facility. CSW spoke with Britney with [facility] to see if they are still able to offer SNF bed for patient if patients spouse can transport patient to HD. Patients spouse informed CSW that he has a wheelchair fleeta that he uses to transport patient to and from HD. CSW awaiting call back from [place] with [facility] to confirm.  Update- CSW spoke with Britney with [facility] who confirmed they will not be able to make SNF bed offer for patient. CSW awaiting call back from patients spouse to update patients spouse.  Update- CSW updated patients spouse. Patients spouse plans to go look at [facility] to see if he would like for patient to go to [facility] for short term rehab. Patients spouse will give CSW a call back once able to view facility. CSW informed MD.  Update 5:18pm- CSW spoke with patients spouse. Patients spouse accepted SNF bed offer with [facility]. CSW added facility choice to patients insurance authorization. Patients insurance authorization approved Plan Auth ID# [id number] Auth ID# [id number]. Insurance authorization has been approved from 1/7-1/9. Rhonda with [facility] confirmed she can accept patient tomorrow if medically ready. CSW provided patients HD information. CSW informed MD.   Expected Discharge Plan:  (TBD SNF vrs. going home with hh services) Barriers to Discharge: Continued Medical Work up  Expected Discharge Plan and Services In-house Referral: Clinical Social Work Discharge Planning Services: CM Consult Post  Acute Care Choice: Home Health Living arrangements for the past 2 months: Single Family Home Expected Discharge Date: 10/26/23                                     Social Determinants of Health (SDOH) Interventions SDOH Screenings   Food Insecurity: No Food Insecurity (10/25/2023)  Housing: Unknown (10/25/2023)  Transportation Needs: No Transportation Needs (10/25/2023)  Utilities: Not At Risk (10/25/2023)  Alcohol Screen: Low Risk  (01/09/2019)  Depression (PHQ2-9): Low Risk  (09/11/2023)  Financial Resource Strain: Low Risk  (09/02/2021)  Physical Activity: Inactive (09/02/2021)  Social Connections: Socially Integrated (10/25/2023)  Stress: Stress Concern Present (09/02/2021)  Tobacco Use: Low Risk  (10/25/2023)    Readmission Risk Interventions    02/14/2023    4:54 PM  Readmission Risk Prevention Plan  Transportation Screening Complete  PCP or Specialist Appt within 3-5 Days Complete  HRI or Home Care Consult Complete  Social Work Consult for Recovery Care Planning/Counseling Complete  Palliative Care Screening Complete  Medication Review [employer]) Complete

## 2023-10-31 NOTE — Progress Notes (Signed)
 Received patient in bed to unit.  Alert and oriented.  Informed consent signed and in chart.   TX duration:3.5 hours  Patient tolerated well.  Transported back to the room  Alert, without acute distress.  Hand-off given to patient's nurse.   Access used: Right internal jugular HD Cath Access issues: A-V and V-A.  Sodium Citrate  used for lumen dwells due to Pork Allergy  Total UF removed: 2.5L Medication(s) given: none   10/31/23 1147  Vitals  Temp 98.3 F (36.8 C)  Temp Source Oral  BP 132/76  BP Location Left Arm  BP Method Automatic  Patient Position (if appropriate) Lying  Pulse Rate 75  Pulse Rate Source Monitor  Resp 11  Oxygen  Therapy  SpO2 100 %  O2 Device Nasal Cannula  O2 Flow Rate (L/min) 2 L/min  Patient Activity (if Appropriate) In bed  Pulse Oximetry Type Continuous  Oximetry Probe Site Changed No  During Treatment Monitoring  Duration of HD Treatment -hour(s) 3.5 hour(s)  HD Safety Checks Performed Yes  Intra-Hemodialysis Comments Tx completed  Dialysis Fluid Bolus Normal Saline  Bolus Amount (mL) 300 mL  Post Treatment  Dialyzer Clearance Lightly streaked  Liters Processed 84  Fluid Removed (mL) 2500 mL  Tolerated HD Treatment Yes  Hemodialysis Catheter Right Subclavian  No placement date or time found.   Orientation: Right  Access Location: Subclavian  Site Condition No complications  Blue Lumen Status Flushed;Dead end cap in place  Red Lumen Status Dead end cap in place;Flushed  Purple Lumen Status N/A  Catheter fill solution 4% Sodium Citrate   Catheter fill volume (Arterial) 1.9 cc  Catheter fill volume (Venous) 1.9  Dressing Type Transparent  Dressing Status Antimicrobial disc in place;Clean, Dry, Intact  Interventions Other (Comment) (deaccessed)  Drainage Description None  Dressing Change Due 11/07/23  Post treatment catheter status Capped and Clamped     Camellia Brasil LPN [facility]

## 2023-10-31 NOTE — Progress Notes (Addendum)
 PROGRESS NOTE    Carolyn Russo  FMW:[id number] DOB: 1951-01-16 DOA: 10/24/2023 PCP: Karlene Mungo, MD   Brief Narrative:  Carolyn Russo is a 73 y.o. female with medical history significant of ESRD on HD, CHF, CVA with residual left-sided weakness, right BKA, seizure disorder, diabetes mellitus type 2 who presented  with sudden onset of severe chest and back pain on the left side during her dialysis session.     In the past few weeks, the patient has been experiencing increased agitation, difficulty sleeping, and elevated blood pressure. The patient's spouse reported that these symptoms began after the cessation of a beta blocker medication due to concerns about slow pulses and pauses in the heart rate.   The patient's spouse also reported concerns about possible dementia, as the patient has been repeating herself and seeming not to understand instructions.    Upon admission into the emergency department patient was noted to be afebrile with blood pressures elevated at 177/107, and O2 saturations currently maintained on room air.  Labs significant for hemoglobin 10, platelets 442, sodium 132, potassium 3.3, BUN 19, creatinine 4.01, and high-sensitivity troponin 66.  Chest x-ray normal unchanged from prior imaging from 02/12/23 mild pulmonary vascular congestion.  Patient had been given fentanyl  50 mcg IV and full dose aspirin .  Further details below.  Assessment & Plan:   Principal Problem:   Chest pain Active Problems:   Elevated troponin   ESRD on dialysis (HCC)   Hypertensive urgency   Hypokalemia   Type 2 diabetes mellitus (HCC)   History of CVA with residual deficit   Memory loss   Primary insomnia   PAD (peripheral artery disease) (HCC)   Seizure disorder (HCC)   Dyslipidemia (high LDL; low HDL)   GERD (gastroesophageal reflux disease)  Chest pain / Elevated troponin, POA: Confirmed with the husband, patient has been complaining of chest pain as well as shoulder pain at  times recently but intermittently.  Troponins are only slightly elevated and flat, consistent with only demand ischemia in the setting of ESRD.  ACS ruled out.  Her major complaint is shoulder pain and back pain.  I think this is likely muscular secondary to positional as she is often in 1 position due to her left hemiparesis.  Discussed with the husband, will avoid muscle relaxants so we can avoid delirium.  Tylenol  did not work and started on oxycodone  yesterday, pain is improved today.  Addendum: Discussed with husband over the phone.  He told me that because of oxycodone , she was knocked out yesterday and did not eat much.  He wants me to try muscle relaxants and stop oxycodone .   ESRD on HD Typical dialysis days are Monday Wednesday Friday but has been changed due to holidays, per husband.  She received 2 hours of dialysis Tuesday instead of 3 hours due to chest pain.  Nephrology consulted, she had short duration of dialysis again on 10/25/2023 and following that, she is receiving routine dialysis.  Appreciate nephrology help.   Hypertensive urgency On admission blood pressures were noted to be elevated up to 177/107.  Patient's husband notes that blood pressures have been elevated ever since Coreg  was discontinued due to bradycardia.  Despite of resuming home losartan  50 mg p.o. twice daily and hydralazine  50 mg twice daily, blood pressure remained elevated, slowly escalated and currently on hydralazine  100 mg 3 times daily and blood pressure improved with the improvement in pain.    Hypokalemia Replenished and resolved.   Controlled  diabetes mellitus type 2, with long-term use of insulin  On admission glucose noted to be 126.  At home patient on a very sensitive SSI. -Hypoglycemic protocols -CBGs before every meal with very sensitive SSI -Adjust regimen as needed.   History of CVA with residual deficit Following prior stroke patient is residual left-sided deficits.  Patient finished  outpatient PT recently, has been requesting PT OT evaluation here.  I have ordered that.   Memory loss Patient has been noticed that over the last couple of weeks her memory has progressively declined.  She has been repeating things and often getting agitated at night.  He has concerns for dementia, but she has not been formally tested. -Delirium precautions.  Patient currently fully alert and oriented. -May warrant Seroquel  overnight if noted to have significant agitation -Recommend outpatient referral to neurology   Insomnia Patient's husband notes that she previously had been on trazodone  for issues with insomnia at night, but was recently switched to amitriptyline . -Continue amitriptyline .  Consider increasing dose -Melatonin added nightly.   Peripheral vascular disease Status post right BKA   History of seizures -Continue Keppra    Dyslipidemia -Continue Crestor    GERD -Continue Protonix    Obesity BMI 31.24 kg/m  Generalized weakness/deconditioning: Assessed by PT OT per husband's request.  They recommend SNF, TOC working on placement.  DVT prophylaxis: heparin  injection 5,000 Units Start: 10/24/23 2200 SCDs Start: 10/24/23 1538   Code Status: Full Code  Family Communication: None present at bedside.    Status is: Observation The patient will require care spanning > 2 midnights and should be moved to inpatient because: Patient has been medically stable and ready for discharge since 10/29/2023, TOC has been consulted to place patient on SNF.   Estimated body mass index is 28.7 kg/m as calculated from the following:   Height as of this encounter: 5' 3 (1.6 m).   Weight as of this encounter: 73.5 kg.    Nutritional Assessment: Body mass index is 28.7 kg/m.SABRA Seen by dietician.  I agree with the assessment and plan as outlined below: Nutrition Status:        . Skin Assessment: I have examined the patient's skin and I agree with the wound assessment as performed  by the wound care RN as outlined below:    Consultants:  Nephrology  Procedures:  None  Antimicrobials:  Anti-infectives (From admission, onward)    None         Subjective: Patient seen and examined in dialysis very, she appeared to be slightly fatigued but denied any complaint such as shortness of breath.  She also endorsed improved pain in the shoulder today.  Objective: Vitals:   10/31/23 1147 10/31/23 1150 10/31/23 1157 10/31/23 1246  BP: 132/76 (!) 152/77  (!) 141/79  Pulse: 75 76    Resp: 11 15  16   Temp: 98.3 F (36.8 C)   98.3 F (36.8 C)  TempSrc: Oral   Oral  SpO2: 100% 100%    Weight:   73.5 kg   Height:        Intake/Output Summary (Last 24 hours) at 10/31/2023 1346 Last data filed at 10/31/2023 1147 Gross per 24 hour  Intake 180 ml  Output 2600 ml  Net -2420 ml    Filed Weights   10/31/23 0500 10/31/23 0743 10/31/23 1157  Weight: 76.2 kg 76 kg 73.5 kg    Examination:  General exam: Appears calm and comfortable  Respiratory system: Clear to auscultation. Respiratory effort normal. Cardiovascular system: S1 &  S2 heard, RRR. No JVD, murmurs, rubs, gallops or clicks.  Right BKA Gastrointestinal system: Abdomen is nondistended, soft and nontender. No organomegaly or masses felt. Normal bowel sounds heard. Central nervous system: Alert and oriented.  Left hemiparesis from previous stroke with dysarthria. Skin: No rashes, lesions or ulcers.  Psychiatry: Judgement and insight appear normal. Mood & affect appropriate.     Data Reviewed: I have personally reviewed following labs and imaging studies  CBC: Recent Labs  Lab 10/25/23 0521 10/27/23 0821 10/31/23 0750  WBC 7.6 8.2 8.6  HGB 9.8* 8.8* 8.5*  HCT 28.4* 26.4* 25.3*  MCV 82.8 85.2 84.1  PLT 400 396 391   Basic Metabolic Panel: Recent Labs  Lab 10/27/23 0520 10/28/23 0400 10/29/23 0354 10/30/23 0329 10/31/23 0508  NA 131* 132* 129* 129* 130*  K 4.0 4.3 4.0 4.4 4.9  CL 91* 97*  94* 93* 93*  CO2 26 24 24 23 23   GLUCOSE 75 82 84 95 70  BUN 31* 21 27* 34* 45*  CREATININE 6.19* 4.32* 5.92* 7.22* 8.53*  CALCIUM  7.4* 8.0* 7.9* 8.0* 7.4*  PHOS 5.1* 3.4 4.0 5.2* 5.7*   GFR: Estimated Creatinine Clearance: 5.6 mL/min (A) (by C-G formula based on SCr of 8.53 mg/dL (H)). Liver Function Tests: Recent Labs  Lab 10/27/23 0520 10/28/23 0400 10/29/23 0354 10/30/23 0329 10/31/23 0508  ALBUMIN  2.7* 2.9* 2.8* 2.8* 2.8*   No results for input(s): LIPASE, AMYLASE in the last 168 hours. No results for input(s): AMMONIA in the last 168 hours. Coagulation Profile: No results for input(s): INR, PROTIME in the last 168 hours. Cardiac Enzymes: Recent Labs  Lab 10/24/23 1743  CKTOTAL 100   BNP (last 3 results) No results for input(s): PROBNP in the last 8760 hours. HbA1C: No results for input(s): HGBA1C in the last 72 hours.  CBG: Recent Labs  Lab 10/30/23 2126 10/31/23 0646 10/31/23 0731 10/31/23 1248 10/31/23 1335  GLUCAP 97 74 87 66* 112*   Lipid Profile: No results for input(s): CHOL, HDL, LDLCALC, TRIG, CHOLHDL, LDLDIRECT in the last 72 hours. Thyroid  Function Tests: No results for input(s): TSH, T4TOTAL, FREET4, T3FREE, THYROIDAB in the last 72 hours. Anemia Panel: No results for input(s): VITAMINB12, FOLATE, FERRITIN, TIBC, IRON , RETICCTPCT in the last 72 hours. Sepsis Labs: No results for input(s): PROCALCITON, LATICACIDVEN in the last 168 hours.  Recent Results (from the past 240 hours)  MRSA Next Gen by PCR, Nasal     Status: None   Collection Time: 10/28/23  4:03 AM   Specimen: Nasal Mucosa; Nasal Swab  Result Value Ref Range Status   MRSA by PCR Next Gen NOT DETECTED NOT DETECTED Final    Comment: (NOTE) The GeneXpert MRSA Assay (FDA approved for NASAL specimens only), is one component of a comprehensive MRSA colonization surveillance program. It is not intended to diagnose MRSA infection nor  to guide or monitor treatment for MRSA infections. Test performance is not FDA approved in patients less than 57 years old. Performed at [facility], [address], [place], [place] [address]      Radiology Studies: No results found.   Scheduled Meds:  (feeding supplement) PROSource Plus  30 mL Oral BID BM   amitriptyline   50 mg Oral QHS   aspirin  EC  81 mg Oral Daily   calcitRIOL   0.5 mcg Oral Q M,W,F   Chlorhexidine  Gluconate Cloth  6 each Topical Q0600   cinacalcet   30 mg Oral Q breakfast   [START ON 11/04/2023]  darbepoetin (ARANESP ) injection - DIALYSIS  200 mcg Subcutaneous Q Sat-1800   diclofenac  Sodium  2 g Topical QID   gabapentin   300 mg Oral QHS   heparin  injection (subcutaneous)  5,000 Units Subcutaneous Q8H   heparin  sodium (porcine)  3,800 Units Intracatheter Once   hydrALAZINE   100 mg Oral TID   insulin  aspart  0-5 Units Subcutaneous QHS   insulin  aspart  0-9 Units Subcutaneous TID WC   levETIRAcetam   250 mg Oral Once   levETIRAcetam   375 mg Oral Q M,W,F-1800   levETIRAcetam   750 mg Oral QHS   loratadine   10 mg Oral Daily   losartan   50 mg Oral BID   melatonin  5 mg Oral QHS   mometasone -formoterol   2 puff Inhalation BID   pantoprazole   40 mg Oral QHS   rosuvastatin   20 mg Oral QHS   sevelamer  carbonate  0.8 g Oral TID with meals   sodium chloride  flush  3 mL Intravenous Q12H   ticagrelor   90 mg Oral BID   Continuous Infusions:     LOS: 0 days   Fredia Skeeter, MD [facility]  10/31/2023, 1:46 PM   *Please note that this is a verbal dictation therefore any spelling or grammatical errors are due to the Dragon Medical One system interpretation.  Please page via Amion and do not message via secure chat for urgent patient care matters. Secure chat can be used for non urgent patient care matters.  How to contact the [facility] Attending or Consulting provider 7A - 7P or covering provider during after hours 7P -7A, for this patient?  Check the care  team in [facility] and look for a) attending/consulting TR[facility] provider listed and b) the [facility] team listed. Page or secure chat 7A-7P. Log into [email or web address] and use [facility] universal password to access. If you do not have the password, please contact the hospital operator. Locate the [facility] provider you are looking for under [facility] and page to a number that you can be directly reached. If you still have difficulty reaching the provider, please page the [facility] (Director on Call) for the Hospitalists listed on amion for assistance.

## 2023-11-01 DIAGNOSIS — R079 Chest pain, unspecified: Secondary | ICD-10-CM | POA: Diagnosis not present

## 2023-11-01 LAB — GLUCOSE, CAPILLARY
Glucose-Capillary: 80 mg/dL (ref 70–99)
Glucose-Capillary: 96 mg/dL (ref 70–99)

## 2023-11-01 LAB — RENAL FUNCTION PANEL
Albumin: 2.7 g/dL — ABNORMAL LOW (ref 3.5–5.0)
Anion gap: 11 (ref 5–15)
BUN: 23 mg/dL (ref 8–23)
CO2: 25 mmol/L (ref 22–32)
Calcium: 7.3 mg/dL — ABNORMAL LOW (ref 8.9–10.3)
Chloride: 93 mmol/L — ABNORMAL LOW (ref 98–111)
Creatinine, Ser: 5.03 mg/dL — ABNORMAL HIGH (ref 0.44–1.00)
GFR, Estimated: 9 mL/min — ABNORMAL LOW (ref >60)
Glucose, Bld: 77 mg/dL (ref 70–99)
Phosphorus: 4.1 mg/dL (ref 2.5–4.6)
Potassium: 3.9 mmol/L (ref 3.5–5.1)
Sodium: 129 mmol/L — ABNORMAL LOW (ref 135–145)

## 2023-11-01 MED ORDER — DARBEPOETIN ALFA 200 MCG/0.4ML IJ SOSY: 200.0000 ug | PREFILLED_SYRINGE | INTRAMUSCULAR | Status: DC

## 2023-11-01 MED ORDER — ANTICOAGULANT SODIUM CITRATE 4% (200MG/5ML) IV SOLN
5.0000 mL | Freq: Once | Status: DC
  Filled 2023-11-01: qty 5

## 2023-11-01 MED ORDER — LOSARTAN POTASSIUM 50 MG PO TABS: 50.0000 mg | ORAL_TABLET | Freq: Two times a day (BID) | ORAL | 0 refills | Status: AC

## 2023-11-01 MED ORDER — HYDRALAZINE HCL 100 MG PO TABS: 100.0000 mg | ORAL_TABLET | Freq: Three times a day (TID) | ORAL | 0 refills | Status: AC

## 2023-11-01 MED ORDER — CYCLOBENZAPRINE HCL 5 MG PO TABS: 5.0000 mg | ORAL_TABLET | Freq: Three times a day (TID) | ORAL | 0 refills | Status: DC | PRN

## 2023-11-01 NOTE — Progress Notes (Signed)
 Vitals stable, back from HD. Pulled patient meds but unable to administer all before transport arrived to discharge. Able to give tylenol , gabapentin , flexeril , heparin , and voltaren  gel. PIV removed, AVS reviewed with husband, and belongings sent with transport/husband.

## 2023-11-01 NOTE — Progress Notes (Signed)
 Pt to d/c to snf today. Pt due for HD today. Contacted inpt HD unit and spoke to charge RN. Staff are going to try to bring pt up for HD as early as they can on 2nd shift so pt can d/c to snf after HD. Contacted [facility] to be advised of pt's d/c today and that pt should resume on Friday. Clinic also provided snf name. HD info placed on AVS as well.   Randine Mungo Renal Navigator [phone]

## 2023-11-01 NOTE — TOC Transition Note (Signed)
 Transition of Care [facility]) - Discharge Note   Patient Details  Name: Carolyn Russo MRN: [id number] Date of Birth: January 09, 1951  [facility] of [facility] CM/SW Contact:  Isaiah Public, LCSWA Phone Number: 11/01/2023, 11:31 AM   Clinical Narrative:     Patient will DC to: [facility]   Anticipated DC date: 11/01/2023  Family notified: [name]  Transport by: [facility]  ?  Per MD patient ready for DC to [facility] . RN, patient, patient's familyTracy Renal Navigator, and facility notified of DC. Discharge Summary sent to facility. RN given number for report [phone] EXT: [phone]. DC packet on chart. Ambulance transport requested for patient.  CSW signing off.   Final next level of care: Skilled Nursing Facility Barriers to Discharge: No Barriers Identified   Patient Goals and CMS Choice Patient states their goals for this hospitalization and ongoing recovery are:: return home   Choice offered to / list presented to : Spouse      Discharge Placement              Patient chooses bed at: [facility] Patient to be transferred to facility by: [facility] Name of family member notified: [name] Patient and family notified of of transfer: 11/01/23  Discharge Plan and Services Additional resources added to the After Visit Summary for   In-house Referral: Clinical Social Work Discharge Planning Services: CM Consult Post Acute Care Choice: Home Health                               Social Drivers of Health (SDOH) Interventions SDOH Screenings   Food Insecurity: No Food Insecurity (10/25/2023)  Housing: Unknown (10/25/2023)  Transportation Needs: No Transportation Needs (10/25/2023)  Utilities: Not At Risk (10/25/2023)  Alcohol Screen: Low Risk  (01/09/2019)  Depression (PHQ2-9): Low Risk  (09/11/2023)  Financial Resource Strain: Low Risk  (09/02/2021)  Physical Activity: Inactive (09/02/2021)  Social Connections: Socially Integrated (10/25/2023)  Stress: Stress  Concern Present (09/02/2021)  Tobacco Use: Low Risk  (10/25/2023)     Readmission Risk Interventions    02/14/2023    4:54 PM  Readmission Risk Prevention Plan  Transportation Screening Complete  PCP or Specialist Appt within 3-5 Days Complete  HRI or Home Care Consult Complete  Social Work Consult for Recovery Care Planning/Counseling Complete  Palliative Care Screening Complete  Medication Review Oceanographer) Complete

## 2023-11-01 NOTE — Progress Notes (Addendum)
   11/01/23 1940  Vitals  Temp 98.3 F (36.8 C)  Temp Source Axillary  BP (!) 193/75  MAP (mmHg) 103  BP Location Left Arm  BP Method Automatic  Patient Position (if appropriate) Lying  Pulse Rate 81  Pulse Rate Source Monitor  ECG Heart Rate 80  Resp 11  Oxygen  Therapy  SpO2 97 %  O2 Device Room Air  Patient Activity (if Appropriate) In bed  During Treatment Monitoring  Blood Flow Rate (mL/min) 0 mL/min  Arterial Pressure (mmHg) -1.61 mmHg  Venous Pressure (mmHg) -2.22 mmHg  TMP (mmHg) -18.99 mmHg  Ultrafiltration Rate (mL/min) 620 mL/min  Dialysate Flow Rate (mL/min) 300 ml/min  Dialysate Potassium Concentration 3  Dialysate Calcium  Concentration 2.5  Duration of HD Treatment -hour(s) 3.5 hour(s)  Cumulative Fluid Removed (mL) per Treatment  1500.06  HD Safety Checks Performed Yes  Intra-Hemodialysis Comments Tx completed  Post Treatment  Dialyzer Clearance Lightly streaked  Liters Processed 84  Fluid Removed (mL) 1500 mL  Tolerated HD Treatment Yes  Post-Hemodialysis Comments pt stable  Hemodialysis Catheter Right Subclavian  No placement date or time found.   Orientation: Right  Access Location: Subclavian  Site Condition No complications  Blue Lumen Status Flushed  Purple Lumen Status Flushed  Catheter fill solution 4% Sodium Citrate   Catheter fill volume (Arterial) 1.9 cc  Catheter fill volume (Venous) 1.9  Dressing Type Transparent  Dressing Status Antimicrobial disc in place;Clean, Dry, Intact  Interventions New dressing  Drainage Description None  Dressing Change Due 11/08/23  Post treatment catheter status Capped and Clamped   Pt treatment uneventful. Elevated blood pressure.Pt is without symtoms,Report endorse to RN Marsa Helling.

## 2023-11-01 NOTE — Discharge Summary (Signed)
 Physician Discharge Summary  Carolyn Russo FMW:[id number] DOB: 02-24-1951 DOA: 10/24/2023  PCP: Karlene Mungo, MD  Admit date: 10/24/2023 Discharge date: 11/01/2023    Admitted From: Home Disposition: SNF  Recommendations for Outpatient Follow-up:  Follow up with PCP in 1-2 weeks Please obtain BMP/CBC in one week Follow-up with your primary cardiologist in 2 to 4 weeks Follow with nephrology for routine dialysis Please follow up with your PCP on the following pending results: Unresulted Labs (From admission, onward)     Start     Ordered   10/25/23 0500  Renal function panel  Daily,   R      10/24/23 1540   Signed and Held  Renal function panel  Once,   R        Signed and Held   Signed and Held  CBC  Tomorrow morning,   R       Question:  Specimen collection method  Answer:  Lab=Lab collect   Signed and Held              Home Health: None none Equipment/Devices: None  Discharge Condition: Stable CODE STATUS: Full code Diet recommendation: Renal  Subjective: Seen and examined.  No new complaint other than right shoulder pain.  Brief/Interim Summary: Carolyn Russo is a 73 y.o. female with medical history significant of ESRD on HD, CHF, CVA with residual left-sided weakness, right BKA, seizure disorder, diabetes mellitus type 2 who presented  with sudden onset of severe chest and back pain on the left side during her dialysis session.     In the past few weeks, the patient has been experiencing increased agitation, difficulty sleeping, and elevated blood pressure. The patient's spouse reported that these symptoms began after the cessation of a beta blocker medication due to concerns about slow pulses and pauses in the heart rate.   The patient's spouse also reported concerns about possible dementia, as the patient has been repeating herself and seeming not to understand instructions.    Upon admission into the emergency department patient was noted to be afebrile with  blood pressures elevated at 177/107, and O2 saturations currently maintained on room air.  Labs significant for hemoglobin 10, platelets 442, sodium 132, potassium 3.3, BUN 19, creatinine 4.01, and high-sensitivity troponin 66.  Chest x-ray normal unchanged from prior imaging from 02/12/23 mild pulmonary vascular congestion.  Patient had been given fentanyl  50 mcg IV and full dose aspirin .  Further details below.   Chest pain / Elevated troponin, POA: Confirmed with the husband, patient has been complaining of chest pain as well as shoulder pain at times recently but intermittently.  Troponins are only slightly elevated and flat, consistent with only demand ischemia in the setting of ESRD.  ACS ruled out.  Her major complaint is shoulder pain and back pain.  I think this is likely muscular secondary to positional as she is often in 1 position due to her left hemiparesis.  Discussed with the husband, will avoid muscle relaxants so we can avoid delirium.  Tylenol  did not work and started on oxycodone , her pain improved but her husband said that she was lethargic and did not eat much so he wanted us  to discontinue that and try Flexeril  which was started yesterday.  Although patient still complains of right shoulder pain however per husband, he believes that this is better than the opioid so he requested to prescribe Flexeril .   ESRD on HD Typical dialysis days are Monday Wednesday Friday but has  been changed due to holidays, per husband.  She received 2 hours of dialysis Tuesday instead of 3 hours due to chest pain.  Nephrology consulted, she had short duration of dialysis again on 10/25/2023 and following that, she received routine dialysis.  Follow-up as outpatient for routine dialysis.   Hypertensive urgency On admission blood pressures were noted to be elevated up to 177/107.  Patient's husband notes that blood pressures have been elevated ever since Coreg  was discontinued due to bradycardia.  Despite of  resuming home losartan  50 mg p.o. twice daily and hydralazine  50 mg twice daily, blood pressure remained elevated, slowly escalated and currently on hydralazine  100 mg 3 times daily and blood pressure improved.   Hypokalemia Replenished and resolved.   Controlled diabetes mellitus type 2, with long-term use of insulin  On admission glucose noted to be 126.  At home patient on a very sensitive SSI. -Hypoglycemic protocols.  Resume home medications.   History of CVA with residual deficit Following prior stroke patient is residual left-sided deficits.  Patient finished outpatient PT recently, has been requesting PT OT evaluation here.  I have ordered that.   Memory loss Patient has been noticed that over the last couple of weeks her memory has progressively declined.  She has been repeating things and often getting agitated at night.  He has concerns for dementia, but she has not been formally tested. -Delirium precautions.  Patient currently fully alert and oriented. -May warrant Seroquel  overnight if noted to have significant agitation -Recommend outpatient referral to neurology   Insomnia Patient's husband notes that she previously had been on trazodone  for issues with insomnia at night, but was recently switched to amitriptyline . -Continue amitriptyline .  Consider increasing dose -Melatonin added nightly.   Peripheral vascular disease Status post right BKA   History of seizures -Continue Keppra    Dyslipidemia -Continue Crestor    GERD -Continue Protonix    Obesity BMI 31.24 kg/m   Generalized weakness/deconditioning: Assessed by PT OT per husband's request.  They recommend SNF, patient is being discharged today to SNF.  Discharge plan was discussed with patient and/or family member/husband and they verbalized understanding and agreed with it.  Discharge Diagnoses:  Principal Problem:   Chest pain Active Problems:   Elevated troponin   ESRD on dialysis [facility])    Hypertensive urgency   Hypokalemia   Type 2 diabetes mellitus (HCC)   History of CVA with residual deficit   Memory loss   Primary insomnia   PAD (peripheral artery disease) (HCC)   Seizure disorder (HCC)   Dyslipidemia (high LDL; low HDL)   GERD (gastroesophageal reflux disease)    Discharge Instructions   Allergies as of 11/01/2023       Reactions   Almond Oil Anaphylaxis   Food Anaphylaxis   Peanuts - anaphylaxis Almonds - anaphylaxis Not listed on MAR   Wheat Other (See Comments)   Upset stomach  Not listed on MAR Other Reaction(s): abdominal pain   Statins Itching, Other (See Comments)   Generalized aches- tolerates crestor   Not listed on MAR   Gadolinium Derivatives Other (See Comments)   Gadolinium-Containing Contrast Media Listed as an allergy on MAR Unknown reaction   Januvia  [sitagliptin ] Other (See Comments)   Listed as an allergy on MAR Unknown reaction   Pork-derived Products Other (See Comments)   Does not eat pork  Listed on MAR   Shellfish Allergy Other (See Comments)   Mouth gets raw Listed on MAR   Tetracycline Other (See Comments)  Raw mouth Not listed on MAR Other Reaction(s): Not available   Tetracycline Hcl    Other reaction(s): raw mouth Other Reaction(s): raw mouth        Medication List     TAKE these medications    acetaminophen  500 MG tablet Commonly known as: TYLENOL  Take 1,000 mg by mouth 2 (two) times daily as needed (leg and arm pain).   albuterol  (2.5 MG/3ML) 0.083% nebulizer solution Commonly known as: PROVENTIL  Use 1 vial (2.5 mg total) by nebulization every 4 (four) hours as needed for wheezing or shortness of breath.   amitriptyline  25 MG tablet Commonly known as: ELAVIL  Take 0.5-1 tablets (12.5-25 mg total) by mouth at bedtime.   aspirin  EC 81 MG tablet Take 1 tablet (81 mg total) by mouth daily. Swallow whole.   calcitRIOL  0.5 MCG capsule Commonly known as: ROCALTROL  Take 0.5 mcg by mouth See admin  instructions. Only takes on Dialysis days   cinacalcet  30 MG tablet Commonly known as: SENSIPAR  Take 30 mg by mouth daily.   cyclobenzaprine  5 MG tablet Commonly known as: FLEXERIL  Take 1 tablet (5 mg total) by mouth 3 (three) times daily as needed for muscle spasms.   diclofenac  Sodium 1 % Gel Commonly known as: VOLTAREN  Apply 2 g topically 4 (four) times daily. To left wrist   gabapentin  300 MG capsule Commonly known as: NEURONTIN  Take 1 capsule (300 mg total) by mouth at bedtime.   Glucagon Emergency 1 MG Kit Inject 1 mg into the vein as needed (CBG of 65mg /dL).   hydrALAZINE  100 MG tablet Commonly known as: APRESOLINE  Take 1 tablet (100 mg total) by mouth 3 (three) times daily. What changed:  medication strength how much to take when to take this   hydrocortisone  2.5 % rectal cream Commonly known as: ANUSOL -HC Place rectally 4 (four) times daily.   insulin  aspart 100 UNIT/ML injection Commonly known as: novoLOG  Inject 0-6 Units into the skin 3 (three) times daily with meals. CBG < 70: Implement Hypoglycemia Standing Orders and refer to Hypoglycemia Standing Orders sidebar report CBG 70 - 120: 0 units CBG 121 - 150: 0 units CBG 151 - 200: 1 unit CBG 201-250: 2 units CBG 251-300: 3 units CBG 301-350: 4 units CBG 351-400: 5 units CBG > 400: Give 6 units and call MD   levETIRAcetam  750 MG tablet Commonly known as: KEPPRA  Take 1 tablet (750 mg total) by mouth at bedtime.   levETIRAcetam  250 MG tablet Commonly known as: KEPPRA  Take 1.5 tablets (375 mg total) by mouth every Monday, Wednesday, and Friday at 6 PM.   loratadine  10 MG tablet Commonly known as: CLARITIN  Take 10 mg by mouth daily.   losartan  50 MG tablet Commonly known as: COZAAR  Take 1 tablet (50 mg total) by mouth 2 (two) times daily. What changed: when to take this   methylphenidate  5 MG tablet Commonly known as: RITALIN  Take 1 tablet (5 mg total) by mouth 2 (two) times daily with breakfast  and lunch.   MIRCERA IJ Inject 1 Dose into the skin every 14 (fourteen) days.   mometasone -formoterol  200-5 MCG/ACT Aero Commonly known as: DULERA  Inhale 2 puffs into the lungs 2 (two) times daily.   pantoprazole  40 MG tablet Commonly known as: Protonix  Take 1 tablet (40 mg total) by mouth daily. What changed: when to take this   prochlorperazine  10 MG tablet Commonly known as: COMPAZINE  Take 1 tablet (10 mg total) by mouth every 8 (eight) hours as needed for nausea or  vomiting.   rosuvastatin  20 MG tablet Commonly known as: CRESTOR  Take 1 tablet (20 mg total) by mouth daily. What changed: when to take this   sevelamer  carbonate 0.8 g Pack packet Commonly known as: RENVELA  Take 0.8 g by mouth 3 (three) times daily.   ticagrelor  90 MG Tabs tablet Commonly known as: BRILINTA  Take 1 tablet (90 mg total) by mouth 2 (two) times daily.   VENOFER  IV Inject 50 mg into the skin once a week.        Contact information for follow-up providers     Karlene Mungo, MD Follow up in 1 week(s).   Specialty: Internal Medicine Contact information: [address] [place] [address] [phone]              Contact information for after-discharge care     Destination     [facility] Preferred SNF .   Service: Skilled Nursing Contact information: [address] [place]  [address] [phone]                    Allergies  Allergen Reactions   Almond Oil Anaphylaxis   Food Anaphylaxis    Peanuts - anaphylaxis Almonds - anaphylaxis Not listed on MAR   Wheat Other (See Comments)    Upset stomach   Not listed on MAR  Other Reaction(s): abdominal pain   Statins Itching and Other (See Comments)    Generalized aches- tolerates crestor   Not listed on MAR   Gadolinium Derivatives Other (See Comments)    Gadolinium-Containing Contrast Media Listed as an allergy on MAR Unknown reaction   Januvia   [Sitagliptin ] Other (See Comments)    Listed as an allergy on MAR Unknown reaction   Pork-Derived Products Other (See Comments)    Does not eat pork  Listed on MAR   Shellfish Allergy Other (See Comments)    Mouth gets raw Listed on MAR   Tetracycline Other (See Comments)    Raw mouth  Not listed on MAR  Other Reaction(s): Not available   Tetracycline Hcl     Other reaction(s): raw mouth  Other Reaction(s): raw mouth    Consultations: Nephrology   Procedures/Studies: ECHOCARDIOGRAM COMPLETE Result Date: 10/25/2023    ECHOCARDIOGRAM REPORT   Patient Name:   Carolyn Russo [facility] Date of Exam: 10/25/2023 Medical Rec #:  [id number]      Height:       63.0 in Accession #:    [id number]     Weight:       176.4 lb Date of Birth:  04-22-1951      BSA:          1.833 m Patient Age:    72 years       BP:           164/79 mmHg Patient Gender: F              HR:           89 bpm. Exam Location:  Inpatient Procedure: 2D Echo, Color Doppler, Cardiac Doppler and Intracardiac            Opacification Agent Indications:    R07.9* Chest pain, unspecified  History:        Patient has prior history of Echocardiogram examinations, most                 recent 06/04/2022. CAD, PAD and ESRD; Risk Factors:Hypertension,  Diabetes and Dyslipidemia.  Sonographer:    Damien Senior RDCS Referring Phys: [phone] RONDELL A SMITH IMPRESSIONS  1. Left ventricular ejection fraction, by estimation, is 55 to 60%. The left ventricle has normal function. The left ventricle has no regional wall motion abnormalities. There is moderate left ventricular hypertrophy. Left ventricular diastolic parameters are indeterminate. Elevated left atrial pressure.  2. Right ventricular systolic function is normal. The right ventricular size is normal. There is mildly elevated pulmonary artery systolic pressure.  3. The mitral valve is normal in structure. No evidence of mitral valve regurgitation. No evidence of mitral stenosis.  4. The  tricuspid valve is abnormal.  5. The aortic valve is tricuspid. There is mild calcification of the aortic valve. There is mild thickening of the aortic valve. Aortic valve regurgitation is not visualized. No aortic stenosis is present. FINDINGS  Left Ventricle: Left ventricular ejection fraction, by estimation, is 55 to 60%. The left ventricle has normal function. The left ventricle has no regional wall motion abnormalities. Definity  contrast agent was given IV to delineate the left ventricular  endocardial borders. The left ventricular internal cavity size was normal in size. There is moderate left ventricular hypertrophy. Left ventricular diastolic parameters are indeterminate. Elevated left atrial pressure. Right Ventricle: The right ventricular size is normal. Right vetricular wall thickness was not well visualized. Right ventricular systolic function is normal. There is mildly elevated pulmonary artery systolic pressure. The tricuspid regurgitant velocity  is 2.53 m/s, and with an assumed right atrial pressure of 15 mmHg, the estimated right ventricular systolic pressure is 40.6 mmHg. Left Atrium: Left atrial size was normal in size. Right Atrium: Right atrial size was normal in size. Pericardium: There is no evidence of pericardial effusion. Mitral Valve: The mitral valve is normal in structure. No evidence of mitral valve regurgitation. No evidence of mitral valve stenosis. Tricuspid Valve: The tricuspid valve is abnormal. Tricuspid valve regurgitation is mild . No evidence of tricuspid stenosis. Aortic Valve: The aortic valve is tricuspid. There is mild calcification of the aortic valve. There is mild thickening of the aortic valve. There is mild aortic valve annular calcification. Aortic valve regurgitation is not visualized. No aortic stenosis  is present. Aortic valve mean gradient measures 6.8 mmHg. Aortic valve peak gradient measures 14.1 mmHg. Aortic valve area, by VTI measures 1.69 cm. Pulmonic  Valve: The pulmonic valve was not well visualized. Pulmonic valve regurgitation is mild. No evidence of pulmonic stenosis. Aorta: The aortic root is normal in size and structure. Venous: The inferior vena cava was not well visualized. IAS/Shunts: No atrial level shunt detected by color flow Doppler.  LEFT VENTRICLE PLAX 2D LVIDd:         4.30 cm   Diastology LVIDs:         2.90 cm   LV e' medial:    4.90 cm/s LV PW:         1.30 cm   LV E/e' medial:  18.7 LV IVS:        1.30 cm   LV e' lateral:   6.20 cm/s LVOT diam:     2.10 cm   LV E/e' lateral: 14.8 LV SV:         59 LV SV Index:   32 LVOT Area:     3.46 cm  RIGHT VENTRICLE RV S prime:     9.90 cm/s TAPSE (M-mode): 1.5 cm LEFT ATRIUM             Index LA diam:  4.10 cm 2.24 cm/m LA Vol (A2C):   51.3 ml 27.99 ml/m LA Vol (A4C):   49.3 ml 26.90 ml/m LA Biplane Vol: 53.4 ml 29.13 ml/m  AORTIC VALVE AV Area (Vmax):    1.60 cm AV Area (Vmean):   1.69 cm AV Area (VTI):     1.69 cm AV Vmax:           187.93 cm/s AV Vmean:          120.975 cm/s AV VTI:            0.349 m AV Peak Grad:      14.1 mmHg AV Mean Grad:      6.8 mmHg LVOT Vmax:         86.65 cm/s LVOT Vmean:        59.147 cm/s LVOT VTI:          0.171 m LVOT/AV VTI ratio: 0.49  AORTA Ao Root diam: 2.60 cm Ao Asc diam:  2.90 cm MITRAL VALVE               TRICUSPID VALVE MV Area (PHT): 4.46 cm    TR Peak grad:   25.6 mmHg MV Decel Time: 170 msec    TR Vmax:        253.00 cm/s MV E velocity: 91.70 cm/s MV A velocity: 81.40 cm/s  SHUNTS MV E/A ratio:  1.13        Systemic VTI:  0.17 m                            Systemic Diam: 2.10 cm Dorn Ross MD Electronically signed by Dorn Ross MD Signature Date/Time: 10/25/2023/2:32:16 PM    Final    CT Angio Chest/Abd/Pel for Dissection W and/or Wo Contrast Result Date: 10/24/2023 CLINICAL DATA:  Two-week history of left chest and shoulder pain radiating into the back. Hypertension. EXAM: CT ANGIOGRAPHY CHEST, ABDOMEN AND PELVIS TECHNIQUE:  Non-contrast CT of the chest was initially obtained. Multidetector CT imaging through the chest, abdomen and pelvis was performed using the standard protocol during bolus administration of intravenous contrast. Multiplanar reconstructed images and MIPs were obtained and reviewed to evaluate the vascular anatomy. RADIATION DOSE REDUCTION: This exam was performed according to the departmental dose-optimization program which includes automated exposure control, adjustment of the mA and/or kV according to patient size and/or use of iterative reconstruction technique. CONTRAST:  80mL OMNIPAQUE  IOHEXOL  350 MG/ML SOLN COMPARISON:  Same day chest radiograph, CT chest, abdomen, and pelvis dated 05/22/2022 FINDINGS: CTA CHEST FINDINGS Cardiovascular: Right IJ central venous catheter tip terminates in the right atrium. Preferential opacification of the thoracic aorta. No evidence of thoracic aortic aneurysm or dissection. Mild multichamber cardiomegaly. No pericardial effusion. Coronary artery calcifications. Mediastinum/Nodes: Imaged thyroid  gland without nodules meeting criteria for imaging follow-up by size. Patulous esophagus. Right hilar lymph node measures 11 mm. Lungs/Pleura: The central airways are patent. No focal consolidation. Subsegmental lingular and left lower lobe atelectasis. No pneumothorax. No pleural effusion. Musculoskeletal: No acute or abnormal lytic or blastic osseous lesions. Multilevel degenerative changes of the thoracic spine. Review of the MIP images confirms the above findings. CTA ABDOMEN AND PELVIS FINDINGS VASCULAR Aorta: Aortic atherosclerosis. Normal caliber aorta without aneurysm, dissection, vasculitis or significant stenosis. Celiac: Patent without evidence of aneurysm, dissection, vasculitis or significant stenosis. SMA: Patent without evidence of aneurysm, dissection, vasculitis or significant stenosis. Renals: Moderate narrowing of the right renal artery origin due to atherosclerotic  plaque. No evidence of aneurysm, dissection, vasculitis, or fibromuscular dysplasia. IMA: Patent without evidence of aneurysm, dissection, vasculitis or significant stenosis. Inflow: Patent without evidence of aneurysm, dissection, vasculitis or significant stenosis. Proximal Outflow: Bilateral common femoral and visualized portions of the superficial and profunda femoral arteries are patent without evidence of aneurysm, dissection, vasculitis or significant stenosis. Veins: No obvious venous abnormality within the limitations of this arterial phase study. Review of the MIP images confirms the above findings. NON-VASCULAR Hepatobiliary: No focal hepatic lesions. No intra or extrahepatic biliary ductal dilation. Mildly distended gallbladder. Pancreas: No focal lesions or main ductal dilation. Spleen: Normal in size without focal abnormality. Adrenals/Urinary Tract: No adrenal nodules. No suspicious renal mass, calculi or hydronephrosis. No focal bladder wall thickening. Stomach/Bowel: Normal appearance of the stomach. No evidence of bowel wall thickening, distention, or inflammatory changes. Colonic diverticulosis without acute diverticulitis. Appendix is not discretely seen. Lymphatic: No enlarged abdominal or pelvic lymph nodes. Reproductive: Scattered coarse calcifications within the uterus, likely leiomyomata. No adnexal masses. Other: Small volume presacral edema. No free air or fluid collection. Musculoskeletal: No acute or abnormal lytic or blastic osseous lesions. Multilevel degenerative changes of the lumbar spine. Small fat-containing paraumbilical and right inguinal hernias. Review of the MIP images confirms the above findings. IMPRESSION: 1. No evidence of aortic aneurysm or dissection. 2. Mildly enlarged right hilar lymph node, nonspecific. 3. Otherwise, no acute intrathoracic, intra-abdominal, or intrapelvic abnormality. 4. Mildly distended gallbladder, nonspecific. Recommend correlation with right  upper quadrant tenderness. 5. Colonic diverticulosis without acute diverticulitis. 6. Aortic Atherosclerosis (ICD10-I70.0). Coronary artery calcifications. Assessment for potential risk factor modification, dietary therapy or pharmacologic therapy may be warranted, if clinically indicated. Electronically Signed   By: Limin  Xu M.D.   On: 10/24/2023 16:20   DG Chest 2 View Result Date: 10/24/2023 CLINICAL DATA:  Left shoulder and chest pain. EXAM: CHEST - 2 VIEW COMPARISON:  02/12/2023 FINDINGS: Right IJ dialysis catheter tip in the right atrium. Stable cardiomediastinal silhouette. Aortic atherosclerotic calcification. Pulmonary vascular congestion. No focal consolidation, pleural effusion, or pneumothorax. No displaced rib fractures. IMPRESSION: No change from 02/12/2023.  Mild pulmonary vascular congestion. Electronically Signed   By: Norman Gatlin M.D.   On: 10/24/2023 13:40     Discharge Exam: Vitals:   11/01/23 0750 11/01/23 0755  BP: (!) 143/61 130/68  Pulse:  80  Resp: 16   Temp: 97.8 F (36.6 C)   SpO2:  99%   Vitals:   10/31/23 2025 11/01/23 0553 11/01/23 0750 11/01/23 0755  BP: 134/61 98/83 (!) 143/61 130/68  Pulse: 84 81  80  Resp: 16 16 16    Temp: 98.5 F (36.9 C) 97.9 F (36.6 C) 97.8 F (36.6 C)   TempSrc: Oral Oral Oral   SpO2: 97% 99%  99%  Weight:  79.5 kg    Height:        General: Pt is alert, awake, not in acute distress Cardiovascular: RRR, S1/S2 +, no rubs, no gallops Respiratory: CTA bilaterally, no wheezing, no rhonchi Abdominal: Soft, NT, ND, bowel sounds + Extremities: no edema, no cyanosis    The results of significant diagnostics from this hospitalization (including imaging, microbiology, ancillary and laboratory) are listed below for reference.     Microbiology: Recent Results (from the past 240 hours)  MRSA Next Gen by PCR, Nasal     Status: None   Collection Time: 10/28/23  4:03 AM   Specimen: Nasal Mucosa; Nasal Swab  Result Value Ref  Range Status   MRSA  by PCR Next Gen NOT DETECTED NOT DETECTED Final    Comment: (NOTE) The GeneXpert MRSA Assay (FDA approved for NASAL specimens only), is one component of a comprehensive MRSA colonization surveillance program. It is not intended to diagnose MRSA infection nor to guide or monitor treatment for MRSA infections. Test performance is not FDA approved in patients less than 68 years old. Performed at [facility], [address] [place], [place] [address]      Labs: BNP (last 3 results) Recent Labs    02/09/23 1356 10/24/23 1220  BNP 331.1* 1,014.6*   Basic Metabolic Panel: Recent Labs  Lab 10/28/23 0400 10/29/23 0354 10/30/23 0329 10/31/23 0508 11/01/23 0453  NA 132* 129* 129* 130* 129*  K 4.3 4.0 4.4 4.9 3.9  CL 97* 94* 93* 93* 93*  CO2 24 24 23 23 25   GLUCOSE 82 84 95 70 77  BUN 21 27* 34* 45* 23  CREATININE 4.32* 5.92* 7.22* 8.53* 5.03*  CALCIUM  8.0* 7.9* 8.0* 7.4* 7.3*  PHOS 3.4 4.0 5.2* 5.7* 4.1   Liver Function Tests: Recent Labs  Lab 10/28/23 0400 10/29/23 0354 10/30/23 0329 10/31/23 0508 11/01/23 0453  ALBUMIN  2.9* 2.8* 2.8* 2.8* 2.7*   No results for input(s): LIPASE, AMYLASE in the last 168 hours. No results for input(s): AMMONIA in the last 168 hours. CBC: Recent Labs  Lab 10/27/23 0821 10/31/23 0750  WBC 8.2 8.6  HGB 8.8* 8.5*  HCT 26.4* 25.3*  MCV 85.2 84.1  PLT 396 391   Cardiac Enzymes: No results for input(s): CKTOTAL, CKMB, CKMBINDEX, TROPONINI in the last 168 hours. BNP: Invalid input(s): POCBNP CBG: Recent Labs  Lab 10/31/23 1248 10/31/23 1335 10/31/23 1624 10/31/23 2055 11/01/23 0751  GLUCAP 66* 112* 94 139* 80   D-Dimer No results for input(s): DDIMER in the last 72 hours. Hgb A1c No results for input(s): HGBA1C in the last 72 hours. Lipid Profile No results for input(s): CHOL, HDL, LDLCALC, TRIG, CHOLHDL, LDLDIRECT in the last 72 hours. Thyroid  function  studies No results for input(s): TSH, T4TOTAL, T3FREE, THYROIDAB in the last 72 hours.  Invalid input(s): FREET3 Anemia work up No results for input(s): VITAMINB12, FOLATE, FERRITIN, TIBC, IRON , RETICCTPCT in the last 72 hours. Urinalysis    Component Value Date/Time   COLORURINE YELLOW 10/24/2023 2154   APPEARANCEUR CLEAR 10/24/2023 2154   LABSPEC 1.016 10/24/2023 2154   PHURINE 9.0 (H) 10/24/2023 2154   GLUCOSEU 150 (A) 10/24/2023 2154   HGBUR NEGATIVE 10/24/2023 2154   BILIRUBINUR NEGATIVE 10/24/2023 2154   BILIRUBINUR negative 06/18/2019 1409   KETONESUR NEGATIVE 10/24/2023 2154   PROTEINUR >=300 (A) 10/24/2023 2154   UROBILINOGEN 0.2 06/18/2019 1409   NITRITE NEGATIVE 10/24/2023 2154   LEUKOCYTESUR NEGATIVE 10/24/2023 2154   Sepsis Labs Recent Labs  Lab 10/27/23 0821 10/31/23 0750  WBC 8.2 8.6   Microbiology Recent Results (from the past 240 hours)  MRSA Next Gen by PCR, Nasal     Status: None   Collection Time: 10/28/23  4:03 AM   Specimen: Nasal Mucosa; Nasal Swab  Result Value Ref Range Status   MRSA by PCR Next Gen NOT DETECTED NOT DETECTED Final    Comment: (NOTE) The GeneXpert MRSA Assay (FDA approved for NASAL specimens only), is one component of a comprehensive MRSA colonization surveillance program. It is not intended to diagnose MRSA infection nor to guide or monitor treatment for MRSA infections. Test performance is not FDA approved in patients less than 57 years old. Performed  at [facility], [address], [place], [place] [address]     FURTHER DISCHARGE INSTRUCTIONS:   Get Medicines reviewed and adjusted: Please take all your medications with you for your next visit with your Primary MD   Laboratory/radiological data: Please request your Primary MD to go over all hospital tests and procedure/radiological results at the follow up, please ask your Primary MD to get all Hospital records sent to his/her office.   In  some cases, they will be blood work, cultures and biopsy results pending at the time of your discharge. Please request that your primary care M.D. goes through all the records of your hospital data and follows up on these results.   Also Note the following: If you experience worsening of your admission symptoms, develop shortness of breath, life threatening emergency, suicidal or homicidal thoughts you must seek medical attention immediately by calling 911 or calling your MD immediately  if symptoms less severe.   You must read complete instructions/literature along with all the possible adverse reactions/side effects for all the Medicines you take and that have been prescribed to you. Take any new Medicines after you have completely understood and accpet all the possible adverse reactions/side effects.    Do not drive when taking Pain medications or sleeping medications (Benzodaizepines)   Do not take more than prescribed Pain, Sleep and Anxiety Medications. It is not advisable to combine anxiety,sleep and pain medications without talking with your primary care practitioner   Special Instructions: If you have smoked or chewed Tobacco  in the last 2 yrs please stop smoking, stop any regular Alcohol  and or any Recreational drug use.   Wear Seat belts while driving.   Please note: You were cared for by a hospitalist during your hospital stay. Once you are discharged, your primary care physician will handle any further medical issues. Please note that NO REFILLS for any discharge medications will be authorized once you are discharged, as it is imperative that you return to your primary care physician (or establish a relationship with a primary care physician if you do not have one) for your post hospital discharge needs so that they can reassess your need for medications and monitor your lab values  Time coordinating discharge: Over 30 minutes  SIGNED:   Fredia Skeeter, MD  [facility] 11/01/2023, 11:12 AM *Please note that this is a verbal dictation therefore any spelling or grammatical errors are due to the Dragon Medical One system interpretation. If 7PM-7AM, please contact night-coverage [email or web address]

## 2023-11-01 NOTE — Discharge Planning (Signed)
 [facility] Patient Discharge Orders - [facility] CLINIC: [place]  Patient's name: Carolyn Russo Admit/DC Dates: 10/24/2023 - 11/01/23  DISCHARGE DIAGNOSES: Chest pain   Debility/dementia HTN urgency  HD ORDER CHANGES: Heparin  change: no EDW Change: YES New EDW: 78.5kg Bath Change: no  ANEMIA MANAGEMENT: Aranesp : Given: yes   Aranesp  given on 1/4 ESA dose for discharge: mircera 200 mcg IV q 2 weeks, to start on 1/11 IV Iron  dose at discharge: Per protocol Transfusion: Given: no  BONE/MINERAL MEDICATIONS: Hectorol /Calcitriol  change: no Sensipar /Parsabiv change: no  ACCESS INTERVENTION/CHANGE: no Details:  RECENT LABS: Recent Labs  Lab 10/31/23 0750 11/01/23 0453  HGB 8.5*  --   NA  --  129*  K  --  3.9  CALCIUM   --  7.3*  PHOS  --  4.1  ALBUMIN   --  2.7*    IV ANTIBIOTICS: no Details:  OTHER ANTICOAGULATION: On Coumadin?: no  OTHER/APPTS/LAB ORDERS:  - D/c to [facility]  D/C Meds to be reconciled by nurse after every discharge.  Completed By: [name], PA-C [facility] Pager [phone]   Reviewed by: MD:______ RN_______

## 2023-11-01 NOTE — Progress Notes (Addendum)
 Physical Therapy Treatment Patient Details Name: Carolyn Russo MRN: [id number] DOB: 09-Dec-1950 Today's Date: 11/01/2023   History of Present Illness Pt is 73 yo female admitted on 10/24/23 with chest pain during HD -per notes demand ischemia in setting of ESRD.  Pt with hx including but not limited to ESRD on HD MWF, CHF, CVA with L residual weakness, R BKA, seizure, DM2    PT Comments  The pt is more alert today, directing the therapist throughout the session. She is making gradual progress with functional mobility independence as she was able to transfer to stand repeatedly with min-modA while utilizing the stedy. Focused session on performing serial sit <> stand reps to improve the pt's lower extremity strength and her independence and posture with transfers. She was only able to stand for up to ~30 sec at a time before fatiguing. Also, provided soft tissue mobilization to pt tolerance to trigger points found at her L chest and posterior shoulder/back that reproduced her pain with palpation. Also, applied heat to these areas at end of session to try to promote muscle relaxation and reduction of pain. Will continue to follow acutely.    If plan is discharge home, recommend the following: A lot of help with bathing/dressing/bathroom;Assistance with cooking/housework;Direct supervision/assist for medications management;Direct supervision/assist for financial management;Assist for transportation;Help with stairs or ramp for entrance;Two people to help with walking and/or transfers   Can travel by private vehicle     No  Equipment Recommendations  Deitra lift;BSC/3in1    Recommendations for Other Services       Precautions / Restrictions Precautions Precautions: Fall Restrictions Weight Bearing Restrictions Per Provider Order: No     Mobility  Bed Mobility Overal bed mobility: Needs Assistance Bed Mobility: Supine to Sit           General bed mobility comments: Husband assisted pt  to bed while PT was out of room getting requested objects for pt, was unaware husband was assisting pt until PT returned    Transfers Overall transfer level: Needs assistance Equipment used: Ambulation equipment used Transfers: Sit to/from Stand, Bed to chair/wheelchair/BSC Sit to Stand: Mod assist, From elevated surface, Min assist           General transfer comment: Pt needed verbal and tactile cues at hips and chest to stand upright and extend hips with each transfer rep. x1 rep from elevated EOB, x > 10 reps from stedy flaps, x1 rep from recliner. MinA from stedy flaps but modA needed to power up to stand from EOB and Patent Examiner via Lift Equipment: Stedy  Ambulation/Gait               General Gait Details: did not attempt this date, focused on strengthening her legs with repeated transfers   Stairs             Wheelchair Mobility     Tilt Bed    Modified Rankin (Stroke Patients Only)       Balance Overall balance assessment: Needs assistance Sitting-balance support: Bilateral upper extremity supported, Single extremity supported, Feet supported Sitting balance-Leahy Scale: Poor Sitting balance - Comments: UE support and pt intermittently needing min assist due to rt and posterior lean Postural control: Posterior lean, Right lateral lean Standing balance support: Bilateral upper extremity supported, During functional activity, Reliant on assistive device for balance Standing balance-Leahy Scale: Poor Standing balance comment: Reliant on UE support and min-modA, cuing pt to extend hips and stand upright, stood for up to ~30  sec at a time before she fatigued and needed to sit                            Cognition Arousal: Alert Behavior During Therapy: Flat affect   Area of Impairment: Attention, Following commands, Problem solving, Safety/judgement, Awareness                   Current Attention Level: Selective   Following  Commands: Follows one step commands consistently, Follows one step commands with increased time, Follows multi-step commands inconsistently Safety/Judgement: Decreased awareness of safety, Decreased awareness of deficits Awareness: Emergent Problem Solving: Slow processing, Difficulty sequencing, Requires verbal cues, Requires tactile cues General Comments: Pt much more verbal and directing therapist this date. Needs cues to remain on task and to progress herself. SLow to process cues and benefits from multi-modal cues        Exercises Other Exercises Other Exercises: Serial sit <> stands reps, x1 rep from elevated EOB, x > 10 reps from stedy flaps, x1 rep from recliner. MinA from stedy flaps but modA needed to power up to stand from EOB and recliner Other Exercises: soft tissue mobilization to trigger points at L chest and L posterior shoulder to pt tolerance, sitting in chair; heat packs applied after Other Exercises: LAQ 10x bil sitting    General Comments        Pertinent Vitals/Pain Pain Assessment Pain Assessment: Faces Faces Pain Scale: Hurts even more Pain Location: L chest/shoulder and back (musculoskeletal in origin based on palpation of muscles reproducing the pain) Pain Descriptors / Indicators: Grimacing, Guarding, Moaning, Other (Comment), Discomfort (I can't breathe) Pain Intervention(s): Limited activity within patient's tolerance, Monitored during session, Repositioned, Utilized relaxation techniques, Heat applied, Other (comment) (gentle soft tissue mobilization applied to pt tolerance)    Home Living                          Prior Function            PT Goals (current goals can now be found in the care plan section) Acute Rehab PT Goals Patient Stated Goal: to get better and go home PT Goal Formulation: With patient/family Time For Goal Achievement: 11/08/23 Potential to Achieve Goals: Good Progress towards PT goals: Progressing toward goals     Frequency    Min 1X/week      PT Plan      Co-evaluation              AM-PAC PT 6 Clicks Mobility   Outcome Measure  Help needed turning from your back to your side while in a flat bed without using bedrails?: A Lot Help needed moving from lying on your back to sitting on the side of a flat bed without using bedrails?: A Lot Help needed moving to and from a bed to a chair (including a wheelchair)?: Total Help needed standing up from a chair using your arms (e.g., wheelchair or bedside chair)?: A Lot Help needed to walk in hospital room?: Total Help needed climbing 3-5 steps with a railing? : Total 6 Click Score: 9    End of Session Equipment Utilized During Treatment: Gait belt Activity Tolerance: Patient limited by fatigue Patient left: with call bell/phone within reach;in chair;with chair alarm set   PT Visit Diagnosis: Other abnormalities of gait and mobility (R26.89);Muscle weakness (generalized) (M62.81);Unsteadiness on feet (R26.81);Difficulty in walking, not elsewhere classified (R26.2)  Time: 1139-1207 PT Time Calculation (min) (ACUTE ONLY): 28 min  Charges:    $Therapeutic Exercise: 8-22 mins $Therapeutic Activity: 8-22 mins PT General Charges $$ ACUTE PT VISIT: 1 Visit                     Theo Ferretti, PT, DPT Acute Rehabilitation Services  Office: [phone]    Theo CHRISTELLA Ferretti 11/01/2023, 12:41 PM

## 2023-11-01 NOTE — TOC Progression Note (Signed)
 Transition of Care [facility]) - Progression Note    Patient Details  Name: Carolyn Russo MRN: [id number] Date of Birth: Jun 18, 1951  [facility] CM/SW Contact  Isaiah Public, LCSWA Phone Number: 11/01/2023, 11:16 AM  Clinical Narrative:     CSW spoke with Shona with [facility] who confirmed patient can dc over today if medically ready. Insurance authorization has been approved. Plan Auth ID# [id number] Auth ID# [id number]. Insurance authorization has been approved from 1/7-1/9. CSW informed MD.CSW will continue to follow.  Expected Discharge Plan:  (TBD SNF vrs. going home with hh services) Barriers to Discharge: Continued Medical Work up  Expected Discharge Plan and Services In-house Referral: Clinical Social Work Discharge Planning Services: CM Consult Post Acute Care Choice: Home Health Living arrangements for the past 2 months: Single Family Home Expected Discharge Date: 11/01/23                                     Social Determinants of Health (SDOH) Interventions SDOH Screenings   Food Insecurity: No Food Insecurity (10/25/2023)  Housing: Unknown (10/25/2023)  Transportation Needs: No Transportation Needs (10/25/2023)  Utilities: Not At Risk (10/25/2023)  Alcohol Screen: Low Risk  (01/09/2019)  Depression (PHQ2-9): Low Risk  (09/11/2023)  Financial Resource Strain: Low Risk  (09/02/2021)  Physical Activity: Inactive (09/02/2021)  Social Connections: Socially Integrated (10/25/2023)  Stress: Stress Concern Present (09/02/2021)  Tobacco Use: Low Risk  (10/25/2023)    Readmission Risk Interventions    02/14/2023    4:54 PM  Readmission Risk Prevention Plan  Transportation Screening Complete  PCP or Specialist Appt within 3-5 Days Complete  HRI or Home Care Consult Complete  Social Work Consult for Recovery Care Planning/Counseling Complete  Palliative Care Screening Complete  Medication Review Oceanographer) Complete

## 2023-11-01 NOTE — Progress Notes (Signed)
 [facility] Progress Note   Subjective:   Patient seen in her room today. She states that she feels mediocre this AM. She had a heat pack behind her left scapula. For HD today. Denies and CP or SOB. She is on 2L of O2 this morning  Objective Vitals:   10/31/23 2025 11/01/23 0553 11/01/23 0750 11/01/23 0755  BP: 134/61 98/83 (!) 143/61 130/68  Pulse: 84 81  80  Resp: 16 16 16    Temp: 98.5 F (36.9 C) 97.9 F (36.6 C) 97.8 F (36.6 C)   TempSrc: Oral Oral Oral   SpO2: 97% 99%  99%  Weight:  79.5 kg    Height:       Physical Exam General: NAD, 2L O2 [place], elderly woman Heart:RRR, no murmur Lungs: Rhonchi bilateral LL Abdomen: Soft and non distended Extremities: No LE edema, R BKA Dialysis Access: [facility]  Additional Objective Labs: Basic Metabolic Panel: Recent Labs  Lab 10/30/23 0329 10/31/23 0508 11/01/23 0453  NA 129* 130* 129*  K 4.4 4.9 3.9  CL 93* 93* 93*  CO2 23 23 25   GLUCOSE 95 70 77  BUN 34* 45* 23  CREATININE 7.22* 8.53* 5.03*  CALCIUM  8.0* 7.4* 7.3*  PHOS 5.2* 5.7* 4.1   Liver Function Tests: Recent Labs  Lab 10/30/23 0329 10/31/23 0508 11/01/23 0453  ALBUMIN  2.8* 2.8* 2.7*   CBC: Recent Labs  Lab 10/27/23 0821 10/31/23 0750  WBC 8.2 8.6  HGB 8.8* 8.5*  HCT 26.4* 25.3*  MCV 85.2 84.1  PLT 396 391    Medications:   (feeding supplement) PROSource Plus  30 mL Oral BID BM   amitriptyline   50 mg Oral QHS   aspirin  EC  81 mg Oral Daily   calcitRIOL   0.5 mcg Oral Q M,W,F   Chlorhexidine  Gluconate Cloth  6 each Topical Q0600   cinacalcet   30 mg Oral Q breakfast   [START ON 11/04/2023] darbepoetin (ARANESP ) injection - DIALYSIS  200 mcg Subcutaneous Q Sat-1800   diclofenac  Sodium  2 g Topical QID   gabapentin   300 mg Oral QHS   heparin  injection (subcutaneous)  5,000 Units Subcutaneous Q8H   heparin  sodium (porcine)  3,800 Units Intracatheter Once   hydrALAZINE   100 mg Oral TID   insulin  aspart  0-5 Units Subcutaneous QHS    insulin  aspart  0-9 Units Subcutaneous TID WC   levETIRAcetam   375 mg Oral Q M,W,F-1800   levETIRAcetam   750 mg Oral QHS   loratadine   10 mg Oral Daily   losartan   50 mg Oral BID   melatonin  5 mg Oral QHS   mometasone -formoterol   2 puff Inhalation BID   pantoprazole   40 mg Oral QHS   rosuvastatin   20 mg Oral QHS   sevelamer  carbonate  0.8 g Oral TID with meals   sodium chloride  flush  3 mL Intravenous Q12H   ticagrelor   90 mg Oral BID    Dialysis Orders MWF- NW 4 Hr, 350/A1.5, EDW 78.9kg, 2K/2Ca, TDC, UFP #2, heparin  2600 unit - Hectoral 4mcg IV q HD -Getting course of IV Venofer  -Mircera 200 mcg IV q 2 weeks  Assessment/Plan: Chest Pain: Denies chest pain this AM, not ACS, low troponin, MSK vs demand ischemia ESRD: Continue HD MWF schedule- On for HD today HTN/volume: BP slightly elevated today, continue HTN medications, possible challenge DW, lower EDW on discharge Anemia of ESRD: Hgb 8.7 - Due for Aranesp  11/03/23- Reordered for future Secondary HPTH: CorrCa/Phos good, continue sevelamer  +  VDRA Nutrition: Albumin  low, receiving supplements T2DM: continue SSI  Dementia: Dispo: SNF placement pending   Belvie Och, AGNP 11/01/2023, 10:00 AM  [facility]

## 2023-11-01 NOTE — Progress Notes (Signed)
 Mobility Specialist Progress Note:   11/01/23 1525  Mobility  Activity Transferred from chair to bed  Level of Assistance Minimal assist, patient does 75% or more (+2)  Assistive Device Stedy  Activity Response Tolerated well  Mobility Referral Yes  Mobility visit 1 Mobility  Mobility Specialist Start Time (ACUTE ONLY) 1525  Mobility Specialist Stop Time (ACUTE ONLY) 1540  Mobility Specialist Time Calculation (min) (ACUTE ONLY) 15 min   Pt needing to transfer to bed to transport to HD. Required minA+2 to stand into stedy. Tolerated transfer well. Pt left with all needs met.   Therisa Rana Mobility Specialist Please contact via SecureChat or  Rehab office at [phone]

## 2023-11-16 ENCOUNTER — Encounter (HOSPITAL_COMMUNITY): Payer: Self-pay

## 2023-11-16 ENCOUNTER — Emergency Department (HOSPITAL_COMMUNITY): Payer: Medicare PPO

## 2023-11-16 ENCOUNTER — Inpatient Hospital Stay (HOSPITAL_COMMUNITY)
Admission: EM | Admit: 2023-11-16 | Discharge: 2024-01-23 | DRG: 064 | Disposition: E | Payer: Medicare PPO | Source: Skilled Nursing Facility | Attending: Internal Medicine | Admitting: Internal Medicine

## 2023-11-16 ENCOUNTER — Inpatient Hospital Stay (HOSPITAL_COMMUNITY): Payer: Medicare PPO

## 2023-11-16 ENCOUNTER — Other Ambulatory Visit: Payer: Self-pay

## 2023-11-16 DIAGNOSIS — I69354 Hemiplegia and hemiparesis following cerebral infarction affecting left non-dominant side: Secondary | ICD-10-CM | POA: Diagnosis not present

## 2023-11-16 DIAGNOSIS — I272 Pulmonary hypertension, unspecified: Secondary | ICD-10-CM | POA: Diagnosis present

## 2023-11-16 DIAGNOSIS — R451 Restlessness and agitation: Secondary | ICD-10-CM | POA: Diagnosis not present

## 2023-11-16 DIAGNOSIS — N179 Acute kidney failure, unspecified: Secondary | ICD-10-CM | POA: Diagnosis not present

## 2023-11-16 DIAGNOSIS — M4802 Spinal stenosis, cervical region: Secondary | ICD-10-CM | POA: Diagnosis present

## 2023-11-16 DIAGNOSIS — S2242XA Multiple fractures of ribs, left side, initial encounter for closed fracture: Secondary | ICD-10-CM | POA: Diagnosis not present

## 2023-11-16 DIAGNOSIS — M4642 Discitis, unspecified, cervical region: Secondary | ICD-10-CM | POA: Diagnosis not present

## 2023-11-16 DIAGNOSIS — H53461 Homonymous bilateral field defects, right side: Secondary | ICD-10-CM | POA: Diagnosis present

## 2023-11-16 DIAGNOSIS — Z751 Person awaiting admission to adequate facility elsewhere: Secondary | ICD-10-CM

## 2023-11-16 DIAGNOSIS — I2583 Coronary atherosclerosis due to lipid rich plaque: Secondary | ICD-10-CM | POA: Diagnosis not present

## 2023-11-16 DIAGNOSIS — H5702 Anisocoria: Secondary | ICD-10-CM | POA: Diagnosis not present

## 2023-11-16 DIAGNOSIS — G9341 Metabolic encephalopathy: Secondary | ICD-10-CM | POA: Diagnosis present

## 2023-11-16 DIAGNOSIS — Z888 Allergy status to other drugs, medicaments and biological substances status: Secondary | ICD-10-CM

## 2023-11-16 DIAGNOSIS — I63543 Cerebral infarction due to unspecified occlusion or stenosis of bilateral cerebellar arteries: Principal | ICD-10-CM | POA: Diagnosis not present

## 2023-11-16 DIAGNOSIS — Z91018 Allergy to other foods: Secondary | ICD-10-CM

## 2023-11-16 DIAGNOSIS — J210 Acute bronchiolitis due to respiratory syncytial virus: Secondary | ICD-10-CM | POA: Diagnosis not present

## 2023-11-16 DIAGNOSIS — D638 Anemia in other chronic diseases classified elsewhere: Secondary | ICD-10-CM | POA: Diagnosis present

## 2023-11-16 DIAGNOSIS — I953 Hypotension of hemodialysis: Secondary | ICD-10-CM | POA: Diagnosis not present

## 2023-11-16 DIAGNOSIS — R7989 Other specified abnormal findings of blood chemistry: Secondary | ICD-10-CM | POA: Diagnosis not present

## 2023-11-16 DIAGNOSIS — Z794 Long term (current) use of insulin: Secondary | ICD-10-CM

## 2023-11-16 DIAGNOSIS — E871 Hypo-osmolality and hyponatremia: Secondary | ICD-10-CM | POA: Diagnosis not present

## 2023-11-16 DIAGNOSIS — Z7982 Long term (current) use of aspirin: Secondary | ICD-10-CM

## 2023-11-16 DIAGNOSIS — J690 Pneumonitis due to inhalation of food and vomit: Secondary | ICD-10-CM | POA: Diagnosis not present

## 2023-11-16 DIAGNOSIS — G928 Other toxic encephalopathy: Secondary | ICD-10-CM | POA: Diagnosis not present

## 2023-11-16 DIAGNOSIS — R079 Chest pain, unspecified: Secondary | ICD-10-CM | POA: Diagnosis present

## 2023-11-16 DIAGNOSIS — T17908D Unspecified foreign body in respiratory tract, part unspecified causing other injury, subsequent encounter: Secondary | ICD-10-CM | POA: Diagnosis not present

## 2023-11-16 DIAGNOSIS — T361X5A Adverse effect of cephalosporins and other beta-lactam antibiotics, initial encounter: Secondary | ICD-10-CM | POA: Diagnosis not present

## 2023-11-16 DIAGNOSIS — J189 Pneumonia, unspecified organism: Secondary | ICD-10-CM | POA: Diagnosis not present

## 2023-11-16 DIAGNOSIS — J1282 Pneumonia due to coronavirus disease 2019: Secondary | ICD-10-CM | POA: Diagnosis present

## 2023-11-16 DIAGNOSIS — L89150 Pressure ulcer of sacral region, unstageable: Secondary | ICD-10-CM | POA: Diagnosis not present

## 2023-11-16 DIAGNOSIS — Z79899 Other long term (current) drug therapy: Secondary | ICD-10-CM

## 2023-11-16 DIAGNOSIS — K5900 Constipation, unspecified: Secondary | ICD-10-CM | POA: Diagnosis present

## 2023-11-16 DIAGNOSIS — Z881 Allergy status to other antibiotic agents status: Secondary | ICD-10-CM

## 2023-11-16 DIAGNOSIS — J9601 Acute respiratory failure with hypoxia: Secondary | ICD-10-CM | POA: Diagnosis present

## 2023-11-16 DIAGNOSIS — Z91041 Radiographic dye allergy status: Secondary | ICD-10-CM

## 2023-11-16 DIAGNOSIS — A0472 Enterocolitis due to Clostridium difficile, not specified as recurrent: Secondary | ICD-10-CM | POA: Diagnosis not present

## 2023-11-16 DIAGNOSIS — J121 Respiratory syncytial virus pneumonia: Secondary | ICD-10-CM | POA: Diagnosis present

## 2023-11-16 DIAGNOSIS — I462 Cardiac arrest due to underlying cardiac condition: Secondary | ICD-10-CM | POA: Diagnosis not present

## 2023-11-16 DIAGNOSIS — Z789 Other specified health status: Secondary | ICD-10-CM

## 2023-11-16 DIAGNOSIS — R001 Bradycardia, unspecified: Secondary | ICD-10-CM | POA: Diagnosis not present

## 2023-11-16 DIAGNOSIS — Z91013 Allergy to seafood: Secondary | ICD-10-CM

## 2023-11-16 DIAGNOSIS — E785 Hyperlipidemia, unspecified: Secondary | ICD-10-CM | POA: Diagnosis present

## 2023-11-16 DIAGNOSIS — I132 Hypertensive heart and chronic kidney disease with heart failure and with stage 5 chronic kidney disease, or end stage renal disease: Secondary | ICD-10-CM | POA: Diagnosis present

## 2023-11-16 DIAGNOSIS — J159 Unspecified bacterial pneumonia: Secondary | ICD-10-CM | POA: Diagnosis present

## 2023-11-16 DIAGNOSIS — Z7902 Long term (current) use of antithrombotics/antiplatelets: Secondary | ICD-10-CM

## 2023-11-16 DIAGNOSIS — I468 Cardiac arrest due to other underlying condition: Secondary | ICD-10-CM | POA: Diagnosis not present

## 2023-11-16 DIAGNOSIS — I739 Peripheral vascular disease, unspecified: Secondary | ICD-10-CM | POA: Diagnosis present

## 2023-11-16 DIAGNOSIS — E119 Type 2 diabetes mellitus without complications: Secondary | ICD-10-CM

## 2023-11-16 DIAGNOSIS — K219 Gastro-esophageal reflux disease without esophagitis: Secondary | ICD-10-CM | POA: Diagnosis present

## 2023-11-16 DIAGNOSIS — E876 Hypokalemia: Secondary | ICD-10-CM | POA: Diagnosis present

## 2023-11-16 DIAGNOSIS — E7800 Pure hypercholesterolemia, unspecified: Secondary | ICD-10-CM | POA: Diagnosis not present

## 2023-11-16 DIAGNOSIS — Z9181 History of falling: Secondary | ICD-10-CM

## 2023-11-16 DIAGNOSIS — R4701 Aphasia: Secondary | ICD-10-CM | POA: Diagnosis present

## 2023-11-16 DIAGNOSIS — N2581 Secondary hyperparathyroidism of renal origin: Secondary | ICD-10-CM | POA: Diagnosis present

## 2023-11-16 DIAGNOSIS — M4622 Osteomyelitis of vertebra, cervical region: Secondary | ICD-10-CM | POA: Diagnosis not present

## 2023-11-16 DIAGNOSIS — I5032 Chronic diastolic (congestive) heart failure: Secondary | ICD-10-CM | POA: Diagnosis not present

## 2023-11-16 DIAGNOSIS — Z800 Family history of malignant neoplasm of digestive organs: Secondary | ICD-10-CM

## 2023-11-16 DIAGNOSIS — R571 Hypovolemic shock: Secondary | ICD-10-CM | POA: Diagnosis not present

## 2023-11-16 DIAGNOSIS — L8990 Pressure ulcer of unspecified site, unspecified stage: Secondary | ICD-10-CM | POA: Insufficient documentation

## 2023-11-16 DIAGNOSIS — I495 Sick sinus syndrome: Secondary | ICD-10-CM | POA: Diagnosis present

## 2023-11-16 DIAGNOSIS — R1312 Dysphagia, oropharyngeal phase: Secondary | ICD-10-CM | POA: Diagnosis present

## 2023-11-16 DIAGNOSIS — G40109 Localization-related (focal) (partial) symptomatic epilepsy and epileptic syndromes with simple partial seizures, not intractable, without status epilepticus: Secondary | ICD-10-CM | POA: Diagnosis present

## 2023-11-16 DIAGNOSIS — E875 Hyperkalemia: Secondary | ICD-10-CM | POA: Diagnosis not present

## 2023-11-16 DIAGNOSIS — Z91010 Allergy to peanuts: Secondary | ICD-10-CM

## 2023-11-16 DIAGNOSIS — R569 Unspecified convulsions: Secondary | ICD-10-CM | POA: Diagnosis not present

## 2023-11-16 DIAGNOSIS — Z992 Dependence on renal dialysis: Secondary | ICD-10-CM

## 2023-11-16 DIAGNOSIS — Z9911 Dependence on respirator [ventilator] status: Secondary | ICD-10-CM

## 2023-11-16 DIAGNOSIS — R627 Adult failure to thrive: Secondary | ICD-10-CM | POA: Diagnosis present

## 2023-11-16 DIAGNOSIS — I2510 Atherosclerotic heart disease of native coronary artery without angina pectoris: Secondary | ICD-10-CM | POA: Diagnosis not present

## 2023-11-16 DIAGNOSIS — I480 Paroxysmal atrial fibrillation: Secondary | ICD-10-CM | POA: Diagnosis not present

## 2023-11-16 DIAGNOSIS — I63542 Cerebral infarction due to unspecified occlusion or stenosis of left cerebellar artery: Secondary | ICD-10-CM | POA: Diagnosis not present

## 2023-11-16 DIAGNOSIS — G40909 Epilepsy, unspecified, not intractable, without status epilepticus: Secondary | ICD-10-CM

## 2023-11-16 DIAGNOSIS — L89159 Pressure ulcer of sacral region, unspecified stage: Secondary | ICD-10-CM | POA: Insufficient documentation

## 2023-11-16 DIAGNOSIS — Z515 Encounter for palliative care: Secondary | ICD-10-CM | POA: Diagnosis not present

## 2023-11-16 DIAGNOSIS — R092 Respiratory arrest: Secondary | ICD-10-CM | POA: Diagnosis not present

## 2023-11-16 DIAGNOSIS — S88111A Complete traumatic amputation at level between knee and ankle, right lower leg, initial encounter: Secondary | ICD-10-CM | POA: Diagnosis present

## 2023-11-16 DIAGNOSIS — E1169 Type 2 diabetes mellitus with other specified complication: Secondary | ICD-10-CM | POA: Diagnosis not present

## 2023-11-16 DIAGNOSIS — N186 End stage renal disease: Secondary | ICD-10-CM | POA: Diagnosis present

## 2023-11-16 DIAGNOSIS — I471 Supraventricular tachycardia, unspecified: Secondary | ICD-10-CM | POA: Diagnosis present

## 2023-11-16 DIAGNOSIS — E669 Obesity, unspecified: Secondary | ICD-10-CM | POA: Diagnosis present

## 2023-11-16 DIAGNOSIS — Z7401 Bed confinement status: Secondary | ICD-10-CM

## 2023-11-16 DIAGNOSIS — R410 Disorientation, unspecified: Secondary | ICD-10-CM | POA: Diagnosis present

## 2023-11-16 DIAGNOSIS — I252 Old myocardial infarction: Secondary | ICD-10-CM

## 2023-11-16 DIAGNOSIS — I4901 Ventricular fibrillation: Secondary | ICD-10-CM | POA: Diagnosis not present

## 2023-11-16 DIAGNOSIS — E1151 Type 2 diabetes mellitus with diabetic peripheral angiopathy without gangrene: Secondary | ICD-10-CM | POA: Diagnosis present

## 2023-11-16 DIAGNOSIS — U071 COVID-19: Secondary | ICD-10-CM | POA: Diagnosis present

## 2023-11-16 DIAGNOSIS — N189 Chronic kidney disease, unspecified: Secondary | ICD-10-CM | POA: Diagnosis present

## 2023-11-16 DIAGNOSIS — I639 Cerebral infarction, unspecified: Secondary | ICD-10-CM | POA: Insufficient documentation

## 2023-11-16 DIAGNOSIS — I959 Hypotension, unspecified: Secondary | ICD-10-CM | POA: Diagnosis not present

## 2023-11-16 DIAGNOSIS — J440 Chronic obstructive pulmonary disease with acute lower respiratory infection: Secondary | ICD-10-CM | POA: Diagnosis present

## 2023-11-16 DIAGNOSIS — T17908A Unspecified foreign body in respiratory tract, part unspecified causing other injury, initial encounter: Secondary | ICD-10-CM | POA: Diagnosis not present

## 2023-11-16 DIAGNOSIS — R0602 Shortness of breath: Secondary | ICD-10-CM | POA: Diagnosis not present

## 2023-11-16 DIAGNOSIS — K921 Melena: Secondary | ICD-10-CM | POA: Diagnosis not present

## 2023-11-16 DIAGNOSIS — A419 Sepsis, unspecified organism: Secondary | ICD-10-CM | POA: Diagnosis not present

## 2023-11-16 DIAGNOSIS — Z89511 Acquired absence of right leg below knee: Secondary | ICD-10-CM

## 2023-11-16 DIAGNOSIS — Z833 Family history of diabetes mellitus: Secondary | ICD-10-CM

## 2023-11-16 DIAGNOSIS — R471 Dysarthria and anarthria: Secondary | ICD-10-CM | POA: Diagnosis present

## 2023-11-16 DIAGNOSIS — R059 Cough, unspecified: Secondary | ICD-10-CM | POA: Diagnosis not present

## 2023-11-16 DIAGNOSIS — I6389 Other cerebral infarction: Secondary | ICD-10-CM | POA: Diagnosis not present

## 2023-11-16 DIAGNOSIS — G9340 Encephalopathy, unspecified: Principal | ICD-10-CM

## 2023-11-16 DIAGNOSIS — Z7189 Other specified counseling: Secondary | ICD-10-CM | POA: Diagnosis not present

## 2023-11-16 DIAGNOSIS — I4891 Unspecified atrial fibrillation: Secondary | ICD-10-CM | POA: Diagnosis not present

## 2023-11-16 DIAGNOSIS — E861 Hypovolemia: Secondary | ICD-10-CM | POA: Diagnosis not present

## 2023-11-16 DIAGNOSIS — X58XXXA Exposure to other specified factors, initial encounter: Secondary | ICD-10-CM | POA: Diagnosis not present

## 2023-11-16 DIAGNOSIS — E11649 Type 2 diabetes mellitus with hypoglycemia without coma: Secondary | ICD-10-CM | POA: Diagnosis not present

## 2023-11-16 DIAGNOSIS — Z808 Family history of malignant neoplasm of other organs or systems: Secondary | ICD-10-CM

## 2023-11-16 DIAGNOSIS — R54 Age-related physical debility: Secondary | ICD-10-CM | POA: Diagnosis present

## 2023-11-16 DIAGNOSIS — Z6820 Body mass index (BMI) 20.0-20.9, adult: Secondary | ICD-10-CM

## 2023-11-16 DIAGNOSIS — I469 Cardiac arrest, cause unspecified: Secondary | ICD-10-CM

## 2023-11-16 DIAGNOSIS — Z820 Family history of epilepsy and other diseases of the nervous system: Secondary | ICD-10-CM

## 2023-11-16 DIAGNOSIS — D8481 Immunodeficiency due to conditions classified elsewhere: Secondary | ICD-10-CM | POA: Diagnosis present

## 2023-11-16 DIAGNOSIS — T50905A Adverse effect of unspecified drugs, medicaments and biological substances, initial encounter: Secondary | ICD-10-CM | POA: Diagnosis not present

## 2023-11-16 DIAGNOSIS — J9600 Acute respiratory failure, unspecified whether with hypoxia or hypercapnia: Secondary | ICD-10-CM | POA: Diagnosis present

## 2023-11-16 DIAGNOSIS — D631 Anemia in chronic kidney disease: Secondary | ICD-10-CM | POA: Diagnosis present

## 2023-11-16 DIAGNOSIS — Z7951 Long term (current) use of inhaled steroids: Secondary | ICD-10-CM

## 2023-11-16 DIAGNOSIS — I493 Ventricular premature depolarization: Secondary | ICD-10-CM | POA: Diagnosis present

## 2023-11-16 DIAGNOSIS — J9811 Atelectasis: Secondary | ICD-10-CM | POA: Diagnosis not present

## 2023-11-16 DIAGNOSIS — I10 Essential (primary) hypertension: Secondary | ICD-10-CM | POA: Diagnosis not present

## 2023-11-16 DIAGNOSIS — E873 Alkalosis: Secondary | ICD-10-CM | POA: Diagnosis not present

## 2023-11-16 HISTORY — DX: End stage renal disease: Z99.2

## 2023-11-16 HISTORY — DX: End stage renal disease: N18.6

## 2023-11-16 LAB — CBC
HCT: 29.3 % — ABNORMAL LOW (ref 36.0–46.0)
Hemoglobin: 9.9 g/dL — ABNORMAL LOW (ref 12.0–15.0)
MCH: 29.6 pg (ref 26.0–34.0)
MCHC: 33.8 g/dL (ref 30.0–36.0)
MCV: 87.7 fL (ref 80.0–100.0)
Platelets: 284 10*3/uL (ref 150–400)
RBC: 3.34 MIL/uL — ABNORMAL LOW (ref 3.87–5.11)
RDW: 19.3 % — ABNORMAL HIGH (ref 11.5–15.5)
WBC: 9 10*3/uL (ref 4.0–10.5)
nRBC: 0.4 % — ABNORMAL HIGH (ref 0.0–0.2)

## 2023-11-16 LAB — BASIC METABOLIC PANEL
Anion gap: 15 (ref 5–15)
BUN: 19 mg/dL (ref 8–23)
CO2: 28 mmol/L (ref 22–32)
Calcium: 8 mg/dL — ABNORMAL LOW (ref 8.9–10.3)
Chloride: 93 mmol/L — ABNORMAL LOW (ref 98–111)
Creatinine, Ser: 4.79 mg/dL — ABNORMAL HIGH (ref 0.44–1.00)
GFR, Estimated: 9 mL/min — ABNORMAL LOW (ref >60)
Glucose, Bld: 106 mg/dL — ABNORMAL HIGH (ref 70–99)
Potassium: 3.3 mmol/L — ABNORMAL LOW (ref 3.5–5.1)
Sodium: 136 mmol/L (ref 135–145)

## 2023-11-16 LAB — HEPATIC FUNCTION PANEL
ALT: 12 U/L (ref 0–44)
AST: 18 U/L (ref 15–41)
Albumin: 2.9 g/dL — ABNORMAL LOW (ref 3.5–5.0)
Alkaline Phosphatase: 110 U/L (ref 38–126)
Bilirubin, Direct: 0.2 mg/dL (ref 0.0–0.2)
Indirect Bilirubin: 0.5 mg/dL (ref 0.3–0.9)
Total Bilirubin: 0.7 mg/dL (ref 0.0–1.2)
Total Protein: 7.3 g/dL (ref 6.5–8.1)

## 2023-11-16 LAB — I-STAT ARTERIAL BLOOD GAS, ED
Acid-Base Excess: 8 mmol/L — ABNORMAL HIGH (ref 0.0–2.0)
Bicarbonate: 33.5 mmol/L — ABNORMAL HIGH (ref 20.0–28.0)
Calcium, Ion: 1.06 mmol/L — ABNORMAL LOW (ref 1.15–1.40)
HCT: 33 % — ABNORMAL LOW (ref 36.0–46.0)
Hemoglobin: 11.2 g/dL — ABNORMAL LOW (ref 12.0–15.0)
O2 Saturation: 99 %
Patient temperature: 36.7
Potassium: 3.4 mmol/L — ABNORMAL LOW (ref 3.5–5.1)
Sodium: 136 mmol/L (ref 135–145)
TCO2: 35 mmol/L — ABNORMAL HIGH (ref 22–32)
pCO2 arterial: 51.1 mm[Hg] — ABNORMAL HIGH (ref 32–48)
pH, Arterial: 7.423 (ref 7.35–7.45)
pO2, Arterial: 144 mm[Hg] — ABNORMAL HIGH (ref 83–108)

## 2023-11-16 LAB — CBG MONITORING, ED
Glucose-Capillary: 83 mg/dL (ref 70–99)
Glucose-Capillary: 78 mg/dL (ref 70–99)

## 2023-11-16 LAB — TROPONIN I (HIGH SENSITIVITY)
Troponin I (High Sensitivity): 205 ng/L (ref <18)
Troponin I (High Sensitivity): 199 ng/L (ref <18)

## 2023-11-16 LAB — I-STAT VENOUS BLOOD GAS, ED
Acid-Base Excess: 3 mmol/L — ABNORMAL HIGH (ref 0.0–2.0)
Bicarbonate: 24.5 mmol/L (ref 20.0–28.0)
Calcium, Ion: 0.66 mmol/L — CL (ref 1.15–1.40)
HCT: 26 % — ABNORMAL LOW (ref 36.0–46.0)
Hemoglobin: 8.8 g/dL — ABNORMAL LOW (ref 12.0–15.0)
O2 Saturation: 100 %
Potassium: 5.8 mmol/L — ABNORMAL HIGH (ref 3.5–5.1)
Sodium: 137 mmol/L (ref 135–145)
TCO2: 25 mmol/L (ref 22–32)
pCO2, Ven: 24.9 mm[Hg] — ABNORMAL LOW (ref 44–60)
pH, Ven: 7.6 — ABNORMAL HIGH (ref 7.25–7.43)
pO2, Ven: 158 mm[Hg] — ABNORMAL HIGH (ref 32–45)

## 2023-11-16 MED ORDER — ACETAMINOPHEN 325 MG PO TABS
650.0000 mg | ORAL_TABLET | Freq: Four times a day (QID) | ORAL | Status: DC | PRN
  Administered 2023-11-19 – 2023-11-22 (×5): 650 mg via ORAL
  Filled 2023-11-16 (×5): qty 2

## 2023-11-16 MED ORDER — SODIUM ZIRCONIUM CYCLOSILICATE 10 G PO PACK: 10.0000 g | PACK | Freq: Once | ORAL | Status: DC

## 2023-11-16 MED ORDER — ACETAMINOPHEN 650 MG RE SUPP: 650.0000 mg | Freq: Four times a day (QID) | RECTAL | Status: DC | PRN

## 2023-11-16 MED ORDER — SODIUM CHLORIDE 0.9% FLUSH
3.0000 mL | Freq: Two times a day (BID) | INTRAVENOUS | Status: DC
  Administered 2023-11-16 – 2023-12-28 (×69): 3 mL via INTRAVENOUS

## 2023-11-16 MED ORDER — SODIUM CHLORIDE 0.9 % IV SOLN
500.0000 mg | INTRAVENOUS | Status: AC
  Administered 2023-11-17 – 2023-11-18 (×3): 500 mg via INTRAVENOUS
  Filled 2023-11-16 (×3): qty 5

## 2023-11-16 MED ORDER — INSULIN ASPART 100 UNIT/ML IJ SOLN
0.0000 [IU] | Freq: Three times a day (TID) | INTRAMUSCULAR | Status: DC
Start: 2023-11-17 — End: 2023-11-23
  Administered 2023-11-18 – 2023-11-21 (×3): 2 [IU] via SUBCUTANEOUS
  Administered 2023-11-22: 1 [IU] via SUBCUTANEOUS

## 2023-11-16 MED ORDER — SODIUM CHLORIDE 0.9 % IV SOLN
2.0000 g | INTRAVENOUS | Status: DC
  Administered 2023-11-16 – 2023-11-21 (×6): 2 g via INTRAVENOUS
  Filled 2023-11-16 (×6): qty 20

## 2023-11-16 MED ORDER — FLUTICASONE FUROATE-VILANTEROL 200-25 MCG/ACT IN AEPB
1.0000 | INHALATION_SPRAY | Freq: Every day | RESPIRATORY_TRACT | Status: DC
  Administered 2023-11-19 – 2023-11-22 (×4): 1 via RESPIRATORY_TRACT
  Filled 2023-11-16 (×2): qty 28

## 2023-11-16 MED ORDER — INSULIN ASPART 100 UNIT/ML IJ SOLN
0.0000 [IU] | Freq: Every day | INTRAMUSCULAR | Status: DC
Start: 2023-11-16 — End: 2023-11-23
  Administered 2023-11-18: 2 [IU] via SUBCUTANEOUS

## 2023-11-16 MED ORDER — IPRATROPIUM-ALBUTEROL 0.5-2.5 (3) MG/3ML IN SOLN
3.0000 mL | RESPIRATORY_TRACT | Status: DC
  Administered 2023-11-16 – 2023-11-18 (×6): 3 mL via RESPIRATORY_TRACT
  Filled 2023-11-16 (×7): qty 3

## 2023-11-16 MED ORDER — PANTOPRAZOLE SODIUM 40 MG IV SOLR
40.0000 mg | INTRAVENOUS | Status: DC
  Administered 2023-11-16 – 2023-11-21 (×6): 40 mg via INTRAVENOUS
  Filled 2023-11-16 (×6): qty 10

## 2023-11-16 MED ORDER — APIXABAN 2.5 MG PO TABS: 2.5000 mg | ORAL_TABLET | Freq: Every day | ORAL | Status: DC

## 2023-11-16 MED ORDER — LEVETIRACETAM 500 MG PO TABS
750.0000 mg | ORAL_TABLET | Freq: Every day | ORAL | Status: DC
  Filled 2023-11-16: qty 1

## 2023-11-16 MED ORDER — IOHEXOL 350 MG/ML SOLN
75.0000 mL | Freq: Once | INTRAVENOUS | Status: AC | PRN
  Administered 2023-11-16: 75 mL via INTRAVENOUS

## 2023-11-16 MED ORDER — POLYETHYLENE GLYCOL 3350 17 G PO PACK: 17.0000 g | PACK | Freq: Every day | ORAL | Status: DC | PRN

## 2023-11-16 MED ORDER — LEVETIRACETAM 250 MG PO TABS: 375.0000 mg | ORAL_TABLET | ORAL | Status: DC

## 2023-11-16 MED ORDER — METHYLPREDNISOLONE SODIUM SUCC 125 MG IJ SOLR
60.0000 mg | Freq: Two times a day (BID) | INTRAMUSCULAR | Status: DC
  Administered 2023-11-16 – 2023-11-17 (×2): 60 mg via INTRAVENOUS
  Filled 2023-11-16 (×2): qty 2

## 2023-11-16 NOTE — ED Notes (Signed)
EDPA and Provider will attempt IV [place]

## 2023-11-16 NOTE — ED Notes (Signed)
Patient transported to x-ray. ?

## 2023-11-16 NOTE — Assessment & Plan Note (Signed)
Patient has chronic left upper extremity weakness.  Chronic right BKA.  Per report patient is typically alert awake oriented x 3.  However is noted to have fluctuating mental status here in the ER.  Patient CAT scan head is showing age-indeterminate left cerebellar infarction.  Clinically speaking I think this is unlikely to be an acute stroke/CVA given hypoxemia on presentation, chest infiltrate on CT chest and patient's reported complaints of shortness of breath and chest pain.  At this time I would like to pursue an MRI to rule out any acute CVA definitively.  And if positive we will engage with neurology.  However before proceeding with an MRI we will get an ABG because I suspect patient may be having metabolic encephalopathy given her mentation at this time. If abg non actiobale, will prceed with MRI.

## 2023-11-16 NOTE — Assessment & Plan Note (Signed)
Troponin up to 205.  Rather flat trajectory.  I believe that this is due to hypoxemia that was documented in the ER.

## 2023-11-16 NOTE — Assessment & Plan Note (Signed)
Patient reports being newly short of breath.  Also having chest pain.  Noted to be hypoxic on presentation now requiring new 2 to 3 L/min of supplementary oxygen.  CAT scan images reviewed.  There seems to be a new infiltrate in the right middle lung field adjacent to the fissure.  Patient is also having wheezes and reduced breath sounds on exam I suspect she is also having an asthma exacerbation.  Patient is immunocompromised given her end-stage renal disease.  I will draw blood cultures and treat the patient with ceftriaxone and azithromycin as well as standing DuoNeb and inhaled corticosteroids and long-acting beta agonist as well as methylprednisolone.  GI prophylaxis with pantoprazole will be ordered

## 2023-11-16 NOTE — ED Notes (Signed)
Pt placed back on 2L NC. 

## 2023-11-16 NOTE — ED Provider Notes (Addendum)
[place] EMERGENCY DEPARTMENT AT [facility] Provider Note   CSN: [id number] Arrival date & time: 11/16/23  1007     History  Chief Complaint  Patient presents with   Chest Pain   Dialysis pt   Carolyn Russo is a 73 y.o. female with past medical history significant for ESRD on HD (MWF), CHF, CVA with residual left-sided weakness, type 2 diabetes, right BKA, seizure disorder presenting today due to worsening left chest pain radiating to the arm.  She said pain is most significant over the shoulder blade on the back.  Describes pain as sharp pain worse with positional change especially when she leans to the left side. Rate 10/10 She has tried Tylenol and heat pad which have provided relief to the pain.  Of note recently admitted for similar chest pain back in 10/23/2024 overall workup was generally unremarkable.  Time patient and husband endorsed chest pain started after a recent mammogram.  EKG and x-ray x-ray at the time were unremarkable.  Troponin at the time was 66.  Echo showed EF of 55-60% with moderate left ventricular hypertrophy and mild aortic valve calcification.   Chest Pain Associated symptoms: cough and shortness of breath   Associated symptoms: no abdominal pain, no diaphoresis, no fatigue and no fever        Home Medications Prior to Admission medications   Medication Sig Start Date End Date Taking? Authorizing Provider  acetaminophen (TYLENOL) 500 MG tablet Take 1,000 mg by mouth 2 (two) times daily as needed (leg and arm pain).    [provider]  albuterol (PROVENTIL) (2.5 MG/3ML) 0.083% nebulizer solution Use 1 vial (2.5 mg total) by nebulization every 4 (four) hours as needed for wheezing or shortness of breath. 02/14/23 02/14/24  Joseph Art, DO  amitriptyline (ELAVIL) 25 MG tablet Take 0.5-1 tablets (12.5-25 mg total) by mouth at bedtime. 09/11/23   Lovorn, Aundra Millet, MD  aspirin EC 81 MG tablet Take 1 tablet (81 mg total) by mouth daily.  Swallow whole. 06/10/22   Willeen Niece, MD  calcitRIOL (ROCALTROL) 0.5 MCG capsule Take 0.5 mcg by mouth See admin instructions. Only takes on Dialysis days 07/10/23   [provider]  cinacalcet (SENSIPAR) 30 MG tablet Take 30 mg by mouth daily.    [provider]  cyclobenzaprine (FLEXERIL) 5 MG tablet Take 1 tablet (5 mg total) by mouth 3 (three) times daily as needed for muscle spasms. 11/01/23   Hughie Closs, MD  diclofenac Sodium (VOLTAREN) 1 % GEL Apply 2 g topically 4 (four) times daily. To left wrist 05/26/21   Love, Evlyn Kanner, PA-C  gabapentin (NEURONTIN) 300 MG capsule Take 1 capsule (300 mg total) by mouth at bedtime. 09/11/23   Lovorn, Aundra Millet, MD  Glucagon, rDNA, (GLUCAGON EMERGENCY) 1 MG KIT Inject 1 mg into the vein as needed (CBG of 65mg /dL).    [provider]  hydrALAZINE (APRESOLINE) 100 MG tablet Take 1 tablet (100 mg total) by mouth 3 (three) times daily. 11/01/23 12/01/23  Hughie Closs, MD  hydrocortisone (ANUSOL-HC) 2.5 % rectal cream Place rectally 4 (four) times daily. 02/14/23   Joseph Art, DO  insulin aspart (NOVOLOG) 100 UNIT/ML injection Inject 0-6 Units into the skin 3 (three) times daily with meals. CBG < 70: Implement Hypoglycemia Standing Orders and refer to Hypoglycemia Standing Orders sidebar report CBG 70 - 120: 0 units CBG 121 - 150: 0 units CBG 151 - 200: 1 unit CBG 201-250: 2 units CBG 251-300:  3 units CBG 301-350: 4 units CBG 351-400: 5 units CBG > 400: Give 6 units and call MD 06/29/22   Pokhrel, Rebekah Chesterfield, MD  Iron Sucrose (VENOFER IV) Inject 50 mg into the skin once a week. 08/16/23 10/18/24  [provider]  levETIRAcetam (KEPPRA) 250 MG tablet Take 1.5 tablets (375 mg total) by mouth every Monday, Wednesday, and Friday at 6 PM. 06/29/22   Pokhrel, Rebekah Chesterfield, MD  levETIRAcetam (KEPPRA) 750 MG tablet Take 1 tablet (750 mg total) by mouth at bedtime. 06/29/22   Pokhrel, Rebekah Chesterfield, MD  loratadine (CLARITIN) 10 MG tablet Take 10 mg by  mouth daily.    [provider]  losartan (COZAAR) 50 MG tablet Take 1 tablet (50 mg total) by mouth 2 (two) times daily. 11/01/23 12/01/23  Hughie Closs, MD  Methoxy PEG-Epoetin Beta (MIRCERA IJ) Inject 1 Dose into the skin every 14 (fourteen) days. 04/19/23 08/28/24  [provider]  methylphenidate (RITALIN) 5 MG tablet Take 1 tablet (5 mg total) by mouth 2 (two) times daily with breakfast and lunch. 09/11/23   Lovorn, Aundra Millet, MD  mometasone-formoterol (DULERA) 200-5 MCG/ACT AERO Inhale 2 puffs into the lungs 2 (two) times daily. 04/15/22   Dorothyann Peng, MD  pantoprazole (PROTONIX) 40 MG tablet Take 1 tablet (40 mg total) by mouth daily. Patient taking differently: Take 40 mg by mouth at bedtime. 02/07/22   Dorothyann Peng, MD  prochlorperazine (COMPAZINE) 10 MG tablet Take 1 tablet (10 mg total) by mouth every 8 (eight) hours as needed for nausea or vomiting. 02/15/23   Sherrilyn Rist, MD  rosuvastatin (CRESTOR) 20 MG tablet Take 1 tablet (20 mg total) by mouth daily. Patient taking differently: Take 20 mg by mouth at bedtime. 08/18/21   Charlesetta Ivory, NP  sevelamer carbonate (RENVELA) 0.8 g PACK packet Take 0.8 g by mouth 3 (three) times daily. 10/19/21   [provider]  ticagrelor (BRILINTA) 90 MG TABS tablet Take 1 tablet (90 mg total) by mouth 2 (two) times daily. 06/10/22   Willeen Niece, MD      Allergies    Almond oil, Food, Wheat, Statins, Gadolinium derivatives, Januvia [sitagliptin], Pork-derived products, Shellfish allergy, Tetracycline, and Tetracycline hcl    Review of Systems   Review of Systems  Constitutional:  Negative for chills, diaphoresis, fatigue and fever.  HENT:  Negative for congestion.   Respiratory:  Positive for cough and shortness of breath. Negative for chest tightness.   Cardiovascular:  Positive for chest pain. Negative for leg swelling.  Gastrointestinal:  Negative for abdominal distention, abdominal pain, constipation and  diarrhea.    Physical Exam Updated Vital Signs BP (!) 163/101   Pulse 99   Temp 98.1 F (36.7 C) (Oral)   Resp 17   SpO2 (!) 88%  Physical Exam Constitutional:      Appearance: She is well-developed. She is obese.  HENT:     Head: Normocephalic and atraumatic.  Cardiovascular:     Rate and Rhythm: Normal rate and regular rhythm.     Pulses:          Radial pulses are 1+ on the right side and 1+ on the left side.     Heart sounds: Normal heart sounds.     No friction rub.  Pulmonary:     Effort: Pulmonary effort is normal.     Breath sounds: Normal breath sounds.  Chest:     Chest wall: Tenderness present.  Abdominal:     General: Bowel sounds  are normal.     Palpations: Abdomen is soft.  Musculoskeletal:     Cervical back: Normal range of motion and neck supple.  Neurological:     Mental Status: She is alert and oriented to person, place, and time.     ED Results / Procedures / Treatments   Labs (all labs ordered are listed, but only abnormal results are displayed) Labs Reviewed  BASIC METABOLIC PANEL - Abnormal; Notable for the following components:      Result Value   Potassium 3.3 (*)    Chloride 93 (*)    Glucose, Bld 106 (*)    Creatinine, Ser 4.79 (*)    Calcium 8.0 (*)    GFR, Estimated 9 (*)    All other components within normal limits  CBC - Abnormal; Notable for the following components:   RBC 3.34 (*)    Hemoglobin 9.9 (*)    HCT 29.3 (*)    RDW 19.3 (*)    nRBC 0.4 (*)    All other components within normal limits  TROPONIN I (HIGH SENSITIVITY) - Abnormal; Notable for the following components:   Troponin I (High Sensitivity) 205 (*)    All other components within normal limits  I-STAT VENOUS BLOOD GAS, ED  TROPONIN I (HIGH SENSITIVITY)    EKG None  Radiology DG Shoulder Left Result Date: 11/16/2023 CLINICAL DATA:  Left arm pain status post fall. EXAM: LEFT SHOULDER - 2+ VIEW COMPARISON:  None Available. FINDINGS: There is no evidence of  acute fracture or dislocation. Mild degenerative changes of the glenohumeral and acromioclavicular joints. IMPRESSION: No acute osseous abnormality. Electronically Signed   By: Hart Robinsons M.D.   On: 11/16/2023 14:54   DG Chest 1 View Result Date: 11/16/2023 CLINICAL DATA:  Chest pain. EXAM: CHEST  1 VIEW COMPARISON:  Radiographs 10/24/2023 and 02/12/2023.  CT 10/24/2023. FINDINGS: 1157 hours. Right IJ hemodialysis catheter projects over the lower right atrium. Stable cardiomegaly and vascular congestion. There is chronic scarring or atelectasis at the left lung base. No edema, confluent airspace disease, significant pleural effusion or pneumothorax. The bones appear unchanged. Telemetry leads overlie the chest. IMPRESSION: Stable cardiomegaly and vascular congestion. No evidence of acute cardiopulmonary process. Electronically Signed   By: Carey Bullocks M.D.   On: 11/16/2023 12:14    ED Course/ Medical Decision Making/ A&P  Medical Decision Making 14:25 Went to check up on patient and husband, Dr. Harlon Flor was at bedside.  Provided supplementary history and reported patient had a recent fall few days ago where she was found on the floor where she apparently rolled off the left side of her bed which is about 2 feet high.  No apparent head injury at the time was reported.  However her husband reports she occasionally will show signs of confusion even prior to her fall. Mostly confused about her location. After discussion with patient's spouse placed order for head CT and left shoulder x-ray given reported fall in an elderly patient.  14:54 Patient noted to have continued hypoxia while in the ED.  She does have history of CHF, and ESRD on HD with most recent HD yesterday, 11/15/2023.  Of note patient is not on home oxygen and given hypoxia with SpO2 down to 88% and reports of chest pain ordered chest CT to rule out PE.   I have reviewed the triage vital signs and the nursing notes.   Pertinent  labs & imaging results that were available during my care of the patient  were reviewed by me and considered in my medical decision making (see chart for details).  73 year old female with multiple comorbidities including ESRD on HD, CHF and type 2 diabetes presenting with left chest pain radiating to the left arm and shoulder blade.  Broad differential including ACS, aortic dissection, pericarditis, pneumonia, PE, shoulder injury.  Extensive workup for patient including EKG, troponin, chest x-ray, shoulder xray and head CT.  Echocardiogram not ordered given patient had recent echo about 2 weeks ago EF of 55-60% with moderate left ventricular hypertrophy and mild aortic stenosis.  Checks x-ray was generally unremarkable and EKG unchanged from prior.  Troponin was elevated at 205 unsure if this is more due to demand ischemia in patients on HD and history of CHF.  Subsequent troponin still pending.  Given pending results patient was signed out to Doyce Para (PA) who will be taking over care.   Amount and/or Complexity of Data Reviewed External Data Reviewed: labs and radiology. Labs: ordered. Radiology: ordered. ECG/medicine tests: ordered and independent interpretation performed.   Final Clinical Impression(s) / ED Diagnoses Final diagnoses:  None    Rx / DC Orders ED Discharge Orders     None         Jerre Simon, MD 11/16/23 1528    Jerre Simon, MD 11/16/23 1528    Anders Simmonds T, DO 11/17/23 2258

## 2023-11-16 NOTE — ED Notes (Addendum)
IV team at bedside 

## 2023-11-16 NOTE — Assessment & Plan Note (Signed)
Chronic, patient does not appear volume overloaded or have acute electrolyte abnormalities.  please, consult renal in a.m. for restarting her dialysis.

## 2023-11-16 NOTE — ED Notes (Signed)
Patient transported to MRI 

## 2023-11-16 NOTE — ED Provider Notes (Signed)
Signout received on this 73 year old female.  See previous note for full details.  Patient awaiting CT angio chest, and CT head.   Physical Exam  BP (!) 164/95 (BP Location: Right Arm)   Pulse 81   Temp 98 F (36.7 C) (Oral)   Resp 17   SpO2 100%     Procedures  .Critical Care  Performed by: Marita Kansas, PA-C Authorized by: Marita Kansas, PA-C   Critical care provider statement:    Critical care time (minutes):  30   Critical care was necessary to treat or prevent imminent or life-threatening deterioration of the following conditions: NSTEMI, concern for CVA.   Critical care was time spent personally by me on the following activities:  Development of treatment plan with patient or surrogate, discussions with consultants, evaluation of patient's response to treatment, examination of patient, ordering and review of laboratory studies, ordering and review of radiographic studies, ordering and performing treatments and interventions, pulse oximetry, re-evaluation of patient's condition and review of old charts   Care discussed with: admitting provider     ED Course / MDM    Medical Decision Making Amount and/or Complexity of Data Reviewed Labs: ordered. Radiology: ordered.  Risk Prescription drug management.   Troponins are elevated.  Initial troponin of 205.  I did discuss the case with cardiology.  They state hold off on heparinizing until we get a delta troponin.  Currently patient is chest pain-free. CT angio PE study without evidence of PE or other acute cardiopulmonary process.  PE study was obtained due to hypoxia.  According to husband patient had nocturnal hypoxia during previous admission as well.  She was not sent home with any O2.  CT head does reveal concern for an infarct.  Discussed with neurology.  They are recommending obtaining MRI.  If there is something acute to consult neurology otherwise patient does not require neurology consult if this is normal as this is likely  a chronic infarct.  And patient is already on dual antiplatelet therapy.  CT head was obtained because patient had a fall       Marita Kansas, Cordelia Poche 11/16/23 2101    Rondel Baton, MD 11/22/23 [phone]

## 2023-11-16 NOTE — ED Triage Notes (Signed)
Pt BIB [facility] from [facility] d/t Lt sided CP that radiates to Lt shoulder blade. Pt was reported to be calling 911 from facility d/t her s/s, staff there gave her Phenergan & Tylenol before calling EMS. Staff reports she is usually A/Ox4, but was seeming a little confused & was weaker than baseline when attempting to get OOB. IS a dialysis pt with Hx of seizure & Lt sided deficit from past stroke. SBP 160, 18 resp, 95% on RA, 105 bpm, CBG 173.

## 2023-11-16 NOTE — ED Notes (Signed)
Patient transported to CT 

## 2023-11-16 NOTE — ED Notes (Signed)
Patient transported to X-ray 

## 2023-11-16 NOTE — Consult Note (Signed)
Cardiology Consultation   Patient ID: SHANDIA PORRATA MRN: [id number]; DOB: 16-Jun-1951  Admit date: 11/16/2023 Date of Consult: 11/16/2023  PCP:  Annita Brod, MD   [facility] Providers Cardiologist:  Chilton Si, MD     Patient Profile:   Carolyn Russo is a 73 y.o. female with a hx of hypertension, hyperlipidemia, diabetes, PAD, CVA with left-sided deficit, ESRD on HD, COPD who is being seen 11/16/2023 for the evaluation of chest pain/elevated troponin at the request of Dr. Eloise Harman.  History of Present Illness:   Carolyn Russo is a 73 year old female with past medical history noted above.  She was previously followed by Dr. Katrinka Blazing and transition to Dr. Duke Salvia at his retirement.  She has a history of prior stroke and GI bleed.  Cardiac catheterization 2018 with 30 to 40% proximal LAD stenosis, LVEF of 60% with recommendations for risk factor modification.  She underwent aortogram and laser atherectomy with right posterior tibial stent 02/2022.  She was seen in follow-up 04/2022 with gangrenous changes to prior toe amputation site and was recommended for right BKA versus AKA.  She was hospitalized shortly thereafter for hypoglycemia and confusion, CT head negative.  Last seen by vascular 08/2022 and declined right BKA at that time.  Seen in the clinic 12/2022 with complaints of fatigue.  Also noted that her BP has been labile with pressures in the 200 systolic range.  Hydralazine 25 mg twice daily was added.  She had recently agreed to undergo BKA and was wearing her prosthesis at that time.  Seen in the office 07/2023 with Dr. Duke Salvia for follow-up.  Did report episode of rapid palpitations 2 weeks prior to that visit with associated lightheadedness/dizziness but no chest pain.  It was noted that her dialysis sessions were occasionally stopped early due to complaints of nausea.  She reported her BP was mostly elevated at the time of dialysis and had not had trouble with low  BPs during sessions.  She was continued on carvedilol, hydralazine and losartan.  She was ordered for 14-day ZIO.  ZIO monitor showed 2% PACs, 146 episodes of SVT at a maximum rate of 179 bpm with 2 pauses due to complete heart block lasting up to 5.8 seconds.  Her carvedilol was stopped and recommended that she be referred to EP.  She was admitted 12/31 to 1/8 with complaints of chest pain, ACS was ruled out and symptoms were felt to be musculoskeletal in nature. Underwent echocardiogram 10/25/2023 with LVEF of 55 to 60%, moderate LVH, normal RV, mildly elevated pulmonary artery systolic pressure, no significant valvular disease.   Presented to the ED on 1/23 with complaints of chest pain. In talking with the patient/husband at the bedside. She was found on the floor at SNF this morning and then complained of chest pain, left shoulder pain and therefore sent to the ED. Husband reports, she was been at SNF since her last hospitalization working with PT. He reports she has been making decent progress.  Labs in the ED showed sodium 136, potassium 3.3, creatinine 4.79, high-sensitivity troponin 205, WBC 9, hemoglobin 9.9.  EKG showed sinus tachycardia, 102 bpm, inferior infarct, age undetermined.  Chest x-ray showed stable cardiomegaly and vascular congestion.  She was noted to be hypoxic in the ED with sats of 88%.  CT head and CT angio PE pending.  Cardiology asked to evaluate in the setting of troponin of 205.  In talking with patient at the bedside, she is lethargic but able  to wake up and answer questions appropriately with stimulation.  Reports pain with palpation to her left anterior chest and shoulder.  Denies any true anginal symptoms with the limited mobility that she has at [facility].   Past Medical History:  Diagnosis Date   Acute GI bleeding    Allergy    Anemia    Anterior chest wall pain    Appendicitis 1965   Asthma    Body mass index 37.0-37.9, adult    Breast pain    Cataract    both  eyes   CHF (congestive heart failure) (HCC)    Chronic kidney disease    stage 5 - on dialysis   Cognitive change 04/20/2021   r/t cva 03/2821   Complication of anesthesia    Memory loss after general   Dehydration 2014   Deviated septum 1971   Diabetes mellitus    Dysphagia due to old stroke    easy to get strangled when eating   Dyspnea 2014   Extrinsic asthma    WITH ASTHMA ATTACK   Fibroid 1980   GERD (gastroesophageal reflux disease)    Heart murmur    History of migraine    History of seizure    with stroke   Hx gestational diabetes    Hyperlipidemia    Hypertension 2014   Inguinal hernia 1959   Malaise and fatigue 2014   Non-IgE mediated allergic asthma 2014   Obesity    Pelvic pain    Pregnancy, high-risk 1985   Stroke (HCC) 04/20/2021   (CVA) of right basal ganglia   Tonsillitis 1968   Uterine fibroid 1980   Visual field defect    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    Past Surgical History:  Procedure Laterality Date   ABDOMINAL AORTOGRAM W/LOWER EXTREMITY N/A 02/21/2022   Procedure: ABDOMINAL AORTOGRAM W/LOWER EXTREMITY;  Surgeon: Maeola Harman, MD;  Location: [facility] INVASIVE CV LAB;  Service: Cardiovascular;  Laterality: N/A;   AMPUTATION Right 05/18/2022   Procedure: RIGHT BELOW KNEE AMPUTATION;  Surgeon: Nadara Mustard, MD;  Location: [facility] OR;  Service: Orthopedics;  Laterality: Right;   AMPUTATION TOE Right 04/15/2022   Procedure: AMPUTATION  LST TOERIGHT FOOT;  Surgeon: Edwin Cap, DPM;  Location: [facility] ORS;  Service: Podiatry;  Laterality: Right;   APPENDECTOMY  1959   BASCILIC VEIN TRANSPOSITION Right 04/30/2021   Procedure: RIGHT FIRST STAGE BASCILIC VEIN TRANSPOSITION;  Surgeon: Chuck Hint, MD;  Location: [facility] OR;  Service: Vascular;  Laterality: Right;   CESAREAN SECTION  1985   COLONOSCOPY     ESOPHAGOGASTRODUODENOSCOPY (EGD) WITH PROPOFOL N/A 04/18/2021   Procedure: ESOPHAGOGASTRODUODENOSCOPY (EGD) WITH PROPOFOL;  Surgeon: Iva Boop, MD;   Location: [facility] ENDOSCOPY;  Service: Endoscopy;  Laterality: N/A;   EYE SURGERY     bilateral cataract    FLEXIBLE SIGMOIDOSCOPY N/A 04/18/2021   Procedure: FLEXIBLE SIGMOIDOSCOPY;  Surgeon: Iva Boop, MD;  Location: [facility] ENDOSCOPY;  Service: Endoscopy;  Laterality: N/A;   HERNIA REPAIR  1959   IR FLUORO GUIDE CV LINE RIGHT  03/18/2021   IR US GUIDE VASC ACCESS RIGHT  03/18/2021   LEFT HEART CATH AND CORONARY ANGIOGRAPHY N/A 04/04/2017   Procedure: Left Heart Cath and Coronary Angiography;  Surgeon: Lyn Records, MD;  Location[facility] INVASIVE CV LAB;  Service: Cardiovascular;  Laterality: N/A;   MYOMECTOMY  1980, 2004, 2007   PERIPHERAL VASCULAR THROMBECTOMY  02/21/2022   Procedure: PERIPHERAL VASCULAR THROMBECTOMY;  Surgeon: Maeola Harman,  MD;  Location: [facility] INVASIVE CV LAB;  Service: Cardiovascular;;  Right Tibial   RHINOPLASTY  1971   ROTATOR CUFF REPAIR  2003   SURGICAL REPAIR OF HEMORRHAGE  2015   TONSILLECTOMY  1968   Inpatient Medications: Scheduled Meds:  Continuous Infusions:  PRN Meds:   Allergies:    Allergies  Allergen Reactions   Almond Oil Anaphylaxis   Food Anaphylaxis    Peanuts - anaphylaxis Almonds - anaphylaxis Not listed on MAR   Wheat Other (See Comments)    Upset stomach   Not listed on MAR  Other Reaction(s): abdominal pain   Statins Itching and Other (See Comments)    Generalized aches- tolerates crestor  Not listed on MAR   Gadolinium Derivatives Other (See Comments)    Gadolinium-Containing Contrast Media Listed as an allergy on MAR Unknown reaction   Januvia [Sitagliptin] Other (See Comments)    Listed as an allergy on MAR Unknown reaction   Pork-Derived Products Other (See Comments)    Does not eat pork  Listed on MAR   Shellfish Allergy Other (See Comments)    Mouth gets raw Listed on MAR   Tetracycline Other (See Comments)    Raw mouth  Not listed on MAR  Other Reaction(s): Not available   Tetracycline Hcl     Other  reaction(s): "raw mouth"  Other Reaction(s): "raw mouth"    Social History:   Social History   Socioeconomic History   Marital status: Married    Spouse name: Channing Mutters   Number of children: 1   Years of education: Not on file   Highest education level: Professional school degree (e.g., MD, DDS, DVM, JD)  Occupational History   Occupation: retired  Tobacco Use   Smoking status: Never   Smokeless tobacco: Never  Vaping Use   Vaping status: Never Used  Substance and Sexual Activity   Alcohol use: No   Drug use: No   Sexual activity: Not Currently    Birth control/protection: Post-menopausal  Other Topics Concern   Not on file  Social History Narrative   06/21/21 lives with husband Dr Chalmers Guest, caregiver- Joyce Gross   Patient reports she used to walk at [employer] but now due to Covid-19 she is staying inside.   Social Drivers of Corporate investment banker Strain: Low Risk  (09/02/2021)   Overall Financial Resource Strain (CARDIA)    Difficulty of Paying Living Expenses: Not hard at all  Food Insecurity: No Food Insecurity (10/25/2023)   Hunger Vital Sign    Worried About Running Out of Food in the Last Year: Never true    Ran Out of Food in the Last Year: Never true  Transportation Needs: No Transportation Needs (10/25/2023)   PRAPARE - Administrator, Civil Service (Medical): No    Lack of Transportation (Non-Medical): No  Physical Activity: Inactive (09/02/2021)   Exercise Vital Sign    Days of Exercise per Week: 0 days    Minutes of Exercise per Session: 0 min  Stress: Stress Concern Present (09/02/2021)   [employer] of Occupational Health - Occupational Stress Questionnaire    Feeling of Stress : To some extent  Social Connections: Socially Integrated (10/25/2023)   Social Connection and Isolation Panel [NHANES]    Frequency of Communication with Friends and Family: Twice a week    Frequency of Social Gatherings with Friends and Family: Once a week    Attends  Religious Services: 1 to 4 times per year  Active Member of Clubs or Organizations: No    Attends Banker Meetings: 1 to 4 times per year    Marital Status: Married  Catering manager Violence: Not At Risk (10/25/2023)   Humiliation, Afraid, Rape, and Kick questionnaire    Fear of Current or Ex-Partner: No    Emotionally Abused: No    Physically Abused: No    Sexually Abused: No    Family History:    Family History  Problem Relation Age of Onset   Psoriasis Mother    Melanoma Mother        abdominal   Alzheimer's disease Father    Diabetes Maternal Aunt    Colon cancer Cousin    Diabetes Cousin    Colon polyps Neg Hx    Esophageal cancer Neg Hx    Cancer - Colon Neg Hx    Liver disease Neg Hx    BRCA 1/2 Neg Hx    Breast cancer Neg Hx      ROS:  Please see the history of present illness.  All other ROS reviewed and negative.     Physical Exam/Data:   Vitals:   11/16/23 1017 11/16/23 1330 11/16/23 1441 11/16/23 1442  BP: 114/72 (!) 181/99 (!) 163/101   Pulse: 82 93 99   Resp: 16 17    Temp: 98.2 F (36.8 C) 98.1 F (36.7 C)    TempSrc:  Oral    SpO2: 92% 100% 92% (!) 88%   No intake or output data in the 24 hours ending 11/16/23 1546    11/01/2023    4:00 PM 11/01/2023    3:45 PM 11/01/2023    5:53 AM  Last 3 Weights  Weight (lbs) 175 lb 4.3 oz 175 lb 4.3 oz 175 lb 4.3 oz  Weight (kg) 79.5 kg 79.5 kg 79.5 kg     There is no height or weight on file to calculate BMI.  General: Well nourished, well developed, in no acute distress HEENT: normal Neck: no JVD Vascular: No carotid bruits Cardiac:  normal S1, S2; RRR; no murmur  Lungs: Coarse rhonchi Abd: soft, nontender, no hepatomegaly  Ext: no edema Musculoskeletal: Right BKA Skin: warm and dry  Neuro:  CNs 2-12 intact, no focal abnormalities noted Psych:  Normal affect   EKG:  The EKG was personally reviewed and demonstrates:  sinus tachycardia, 102 bpm, inferior infarct, age  undetermined  Relevant CV Studies:  Echocardiogram 10/2023  IMPRESSIONS     1. Left ventricular ejection fraction, by estimation, is 55 to 60%. The  left ventricle has normal function. The left ventricle has no regional  wall motion abnormalities. There is moderate left ventricular hypertrophy.  Left ventricular diastolic  parameters are indeterminate. Elevated left atrial pressure.   2. Right ventricular systolic function is normal. The right ventricular  size is normal. There is mildly elevated pulmonary artery systolic  pressure.   3. The mitral valve is normal in structure. No evidence of mitral valve  regurgitation. No evidence of mitral stenosis.   4. The tricuspid valve is abnormal.   5. The aortic valve is tricuspid. There is mild calcification of the  aortic valve. There is mild thickening of the aortic valve. Aortic valve  regurgitation is not visualized. No aortic stenosis is present.   FINDINGS   Left Ventricle: Left ventricular ejection fraction, by estimation, is 55  to 60%. The left ventricle has normal function. The left ventricle has no  regional wall motion abnormalities. Definity contrast  agent was given IV  to delineate the left ventricular   endocardial borders. The left ventricular internal cavity size was normal  in size. There is moderate left ventricular hypertrophy. Left ventricular  diastolic parameters are indeterminate. Elevated left atrial pressure.   Right Ventricle: The right ventricular size is normal. Right vetricular  wall thickness was not well visualized. Right ventricular systolic  function is normal. There is mildly elevated pulmonary artery systolic  pressure. The tricuspid regurgitant velocity   is 2.53 m/s, and with an assumed right atrial pressure of 15 mmHg, the  estimated right ventricular systolic pressure is 40.6 mmHg.   Left Atrium: Left atrial size was normal in size.   Right Atrium: Right atrial size was normal in size.    Pericardium: There is no evidence of pericardial effusion.   Mitral Valve: The mitral valve is normal in structure. No evidence of  mitral valve regurgitation. No evidence of mitral valve stenosis.   Tricuspid Valve: The tricuspid valve is abnormal. Tricuspid valve  regurgitation is mild . No evidence of tricuspid stenosis.   Aortic Valve: The aortic valve is tricuspid. There is mild calcification  of the aortic valve. There is mild thickening of the aortic valve. There  is mild aortic valve annular calcification. Aortic valve regurgitation is  not visualized. No aortic stenosis   is present. Aortic valve mean gradient measures 6.8 mmHg. Aortic valve  peak gradient measures 14.1 mmHg. Aortic valve area, by VTI measures 1.69  cm.   Pulmonic Valve: The pulmonic valve was not well visualized. Pulmonic valve  regurgitation is mild. No evidence of pulmonic stenosis.   Aorta: The aortic root is normal in size and structure.   Venous: The inferior vena cava was not well visualized.   IAS/Shunts: No atrial level shunt detected by color flow Doppler.    Laboratory Data:  High Sensitivity Troponin:   Recent Labs  Lab 10/24/23 1220 10/24/23 1743 11/16/23 1044  TROPONINIHS 66* 57* 205*     Chemistry Recent Labs  Lab 11/16/23 1044  NA 136  K 3.3*  CL 93*  CO2 28  GLUCOSE 106*  BUN 19  CREATININE 4.79*  CALCIUM 8.0*  GFRNONAA 9*  ANIONGAP 15    No results for input(s): "PROT", "ALBUMIN", "AST", "ALT", "ALKPHOS", "BILITOT" in the last 168 hours. Lipids No results for input(s): "CHOL", "TRIG", "HDL", "LABVLDL", "LDLCALC", "CHOLHDL" in the last 168 hours.  Hematology Recent Labs  Lab 11/16/23 1044  WBC 9.0  RBC 3.34*  HGB 9.9*  HCT 29.3*  MCV 87.7  MCH 29.6  MCHC 33.8  RDW 19.3*  PLT 284   Thyroid No results for input(s): "TSH", "FREET4" in the last 168 hours.  BNPNo results for input(s): "BNP", "PROBNP" in the last 168 hours.  DDimer No results for input(s):  "DDIMER" in the last 168 hours.   Radiology/Studies:  DG Shoulder Left Result Date: 11/16/2023 CLINICAL DATA:  Left arm pain status post fall. EXAM: LEFT SHOULDER - 2+ VIEW COMPARISON:  None Available. FINDINGS: There is no evidence of acute fracture or dislocation. Mild degenerative changes of the glenohumeral and acromioclavicular joints. IMPRESSION: No acute osseous abnormality. Electronically Signed   By: Hart Robinsons M.D.   On: 11/16/2023 14:54   DG Chest 1 View Result Date: 11/16/2023 CLINICAL DATA:  Chest pain. EXAM: CHEST  1 VIEW COMPARISON:  Radiographs 10/24/2023 and 02/12/2023.  CT 10/24/2023. FINDINGS: 1157 hours. Right IJ hemodialysis catheter projects over the lower right atrium. Stable cardiomegaly and vascular  congestion. There is chronic scarring or atelectasis at the left lung base. No edema, confluent airspace disease, significant pleural effusion or pneumothorax. The bones appear unchanged. Telemetry leads overlie the chest. IMPRESSION: Stable cardiomegaly and vascular congestion. No evidence of acute cardiopulmonary process. Electronically Signed   By: Carey Bullocks M.D.   On: 11/16/2023 12:14     Assessment and Plan:   Carolyn Russo is a 73 y.o. female with a hx of hypertension, hyperlipidemia, diabetes, PAD, CVA with left-sided deficit, ESRD on HD, COPD who is being seen 11/16/2023 for the evaluation of chest pain/elevated troponin at the request of Dr. Eloise Harman.  Chest pain Nonobstructive CAD '18 Elevated troponin -- Presented with left-sided chest pain, after a fall out of bed this morning.  Recent admission with similar complaints of left-sided chest pain and low-level troponin of 60-70.  Echocardiogram with LVEF of 55 to 60%, no regional wall motion abnormalities.  In talking with patient she reports that her pain never really subsided from this previous admission.  She is tender to palpation across the left anterior chest. -- High-sensitivity troponin 205, EKG  with nonspecific changes -- Symptoms are mostly atypical and in the setting of ESRD, reasonable to trend troponins. Defer IV heparin unless significant delta  -- repeat echo to ensure no acute changes  -- CT angio pending  ESRD on HD -- on MWF schedule, was able to sit for complete session yesterday  Hypertension -- BPs have elevated historically, does not have issues with low BPs with HD -- on losartan, hydralazine PTA  Tachy-brady syndrome -- recent outpatient Zio with episodes of SVT and pauses. Coreg was stopped with outpatient appt with EP pending  Per primary Hx of CVA-left sided deficits DM PAD s/p right BKA  For questions or updates, please contact [facility] Please consult [email or web address] for contact info under   Signed, Laverda Page, NP  11/16/2023 3:46 PM

## 2023-11-16 NOTE — H&P (Signed)
History and Physical    Patient: Carolyn Russo WGN:[id number] DOB: 26-Jun-1951 DOA: 11/16/2023 DOS: the patient was seen and examined on 11/16/2023 PCP: Annita Brod, MD  Patient coming from: SNF/dialysis.  Chief Complaint:  Chief Complaint  Patient presents with   Chest Pain   Dialysis pt   HPI: Carolyn Russo is a 73 y.o. female with medical history significant of medical issues as listed below. Patient is on dialysis as well.  History is limited from the patient.  Patient reports that she has been having upper back pain, like a knife is in the back for about 2 to 3 weeks with associated sensation of shortness of breath and coughing.  And she states that she got increasingly short of breath and that is why she came to the ER today.  Per record review here at [facility], I am advised that the patient finished dialysis today and subsequently was having left-sided chest pain radiating to the left shoulder blade and called 911 for her symptoms.  There is no report of patient having leg swelling palpitation or loss of consciousness or fever.  Patient has been noted to have a fluctuating mentation in the ER.  There is report of patient being confused early on.  However the ER doc who gave me report advised that the patient was pretty alert and with it.  However she has subsequently become more sleepy.  Patient was further found to be hypoxic on room air in the ER and patient has been placed on 2 to 3 L/min of supplementary oxygen.  Now saturating 97%.  Patient troponin was minimally elevated to 200s.  Patient also had a CT chest angiogram done with no evidence of pulmonary embolism.   Review of Systems: As mentioned in the history of present illness. All other systems reviewed and are negative. Past Medical History:  Diagnosis Date   Acute GI bleeding    Allergy    Anemia    Anterior chest wall pain    Appendicitis 1965   Asthma    Body mass index 37.0-37.9, adult    Breast pain     Cataract    both eyes   CHF (congestive heart failure) (HCC)    Chronic kidney disease    stage 5 - on dialysis   Cognitive change 04/20/2021   r/t cva 03/2821   Complication of anesthesia    Memory loss after general   Dehydration 2014   Deviated septum 1971   Diabetes mellitus    Dysphagia due to old stroke    easy to get strangled when eating   Dyspnea 2014   Extrinsic asthma    WITH ASTHMA ATTACK   Fibroid 1980   GERD (gastroesophageal reflux disease)    Heart murmur    History of migraine    History of seizure    with stroke   Hx gestational diabetes    Hyperlipidemia    Hypertension 2014   Inguinal hernia 1959   Malaise and fatigue 2014   Non-IgE mediated allergic asthma 2014   Obesity    Pelvic pain    Pregnancy, high-risk 1985   Stroke (HCC) 04/20/2021   (CVA) of right basal ganglia   Tonsillitis 1968   Uterine fibroid 1980   Visual field defect    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   Past Surgical History:  Procedure Laterality Date   ABDOMINAL AORTOGRAM W/LOWER EXTREMITY N/A 02/21/2022   Procedure: ABDOMINAL AORTOGRAM W/LOWER EXTREMITY;  Surgeon: Maeola Harman,  MD;  Location: [facility] INVASIVE CV LAB;  Service: Cardiovascular;  Laterality: N/A;   AMPUTATION Right 05/18/2022   Procedure: RIGHT BELOW KNEE AMPUTATION;  Surgeon: Nadara Mustard, MD;  Location: [facility] OR;  Service: Orthopedics;  Laterality: Right;   AMPUTATION TOE Right 04/15/2022   Procedure: AMPUTATION  LST TOERIGHT FOOT;  Surgeon: Edwin Cap, DPM;  Location: [facility] ORS;  Service: Podiatry;  Laterality: Right;   APPENDECTOMY  1959   BASCILIC VEIN TRANSPOSITION Right 04/30/2021   Procedure: RIGHT FIRST STAGE BASCILIC VEIN TRANSPOSITION;  Surgeon: Chuck Hint, MD;  Location: [facility] OR;  Service: Vascular;  Laterality: Right;   CESAREAN SECTION  1985   COLONOSCOPY     ESOPHAGOGASTRODUODENOSCOPY (EGD) WITH PROPOFOL N/A 04/18/2021   Procedure: ESOPHAGOGASTRODUODENOSCOPY (EGD) WITH PROPOFOL;  Surgeon:  Iva Boop, MD;  Location: [facility] ENDOSCOPY;  Service: Endoscopy;  Laterality: N/A;   EYE SURGERY     bilateral cataract    FLEXIBLE SIGMOIDOSCOPY N/A 04/18/2021   Procedure: FLEXIBLE SIGMOIDOSCOPY;  Surgeon: Iva Boop, MD;  Location: [facility] ENDOSCOPY;  Service: Endoscopy;  Laterality: N/A;   HERNIA REPAIR  1959   IR FLUORO GUIDE CV LINE RIGHT  03/18/2021   IR US GUIDE VASC ACCESS RIGHT  03/18/2021   LEFT HEART CATH AND CORONARY ANGIOGRAPHY N/A 04/04/2017   Procedure: Left Heart Cath and Coronary Angiography;  Surgeon: Lyn Records, MD;  Location: [facility] INVASIVE CV LAB;  Service: Cardiovascular;  Laterality: N/A;   MYOMECTOMY  1980, 2004, 2007   PERIPHERAL VASCULAR THROMBECTOMY  02/21/2022   Procedure: PERIPHERAL VASCULAR THROMBECTOMY;  Surgeon: Maeola Harman, MD;  Location: [facility] INVASIVE CV LAB;  Service: Cardiovascular;;  Right Tibial   RHINOPLASTY  1971   ROTATOR CUFF REPAIR  2003   SURGICAL REPAIR OF HEMORRHAGE  2015   TONSILLECTOMY  1968   Social History:  reports that she has never smoked. She has never used smokeless tobacco. She reports that she does not drink alcohol and does not use drugs.  Allergies  Allergen Reactions   Almond Oil Anaphylaxis   Food Anaphylaxis    Peanuts - anaphylaxis Almonds - anaphylaxis Not listed on MAR   Wheat Other (See Comments)    Upset stomach   Not listed on MAR  Other Reaction(s): abdominal pain   Statins Itching and Other (See Comments)    Generalized aches- tolerates crestor  Not listed on MAR   Gadolinium Derivatives Other (See Comments)    Gadolinium-Containing Contrast Media Listed as an allergy on MAR Unknown reaction   Januvia [Sitagliptin] Other (See Comments)    Listed as an allergy on MAR Unknown reaction   Pork-Derived Products Other (See Comments)    Does not eat pork  Listed on MAR   Shellfish Allergy Other (See Comments)    Mouth gets raw Listed on MAR   Tetracycline Other (See Comments)    Raw mouth  Not  listed on MAR  Other Reaction(s): Not available   Tetracycline Hcl     Other reaction(s): "raw mouth"  Other Reaction(s): "raw mouth"    Family History  Problem Relation Age of Onset   Psoriasis Mother    Melanoma Mother        abdominal   Alzheimer's disease Father    Diabetes Maternal Aunt    Colon cancer Cousin    Diabetes Cousin    Colon polyps Neg Hx    Esophageal cancer Neg Hx    Cancer - Colon Neg Hx  Liver disease Neg Hx    BRCA 1/2 Neg Hx    Breast cancer Neg Hx     Prior to Admission medications   Medication Sig Start Date End Date Taking? Authorizing Provider  acetaminophen (TYLENOL) 500 MG tablet Take 1,000 mg by mouth 2 (two) times daily as needed (leg and arm pain).    [provider]  albuterol (PROVENTIL) (2.5 MG/3ML) 0.083% nebulizer solution Use 1 vial (2.5 mg total) by nebulization every 4 (four) hours as needed for wheezing or shortness of breath. 02/14/23 02/14/24  Joseph Art, DO  amitriptyline (ELAVIL) 25 MG tablet Take 0.5-1 tablets (12.5-25 mg total) by mouth at bedtime. 09/11/23   Lovorn, Aundra Millet, MD  aspirin EC 81 MG tablet Take 1 tablet (81 mg total) by mouth daily. Swallow whole. 06/10/22   Willeen Niece, MD  calcitRIOL (ROCALTROL) 0.5 MCG capsule Take 0.5 mcg by mouth See admin instructions. Only takes on Dialysis days 07/10/23   [provider]  cinacalcet (SENSIPAR) 30 MG tablet Take 30 mg by mouth daily.    [provider]  cyclobenzaprine (FLEXERIL) 5 MG tablet Take 1 tablet (5 mg total) by mouth 3 (three) times daily as needed for muscle spasms. 11/01/23   Hughie Closs, MD  diclofenac Sodium (VOLTAREN) 1 % GEL Apply 2 g topically 4 (four) times daily. To left wrist 05/26/21   Love, Evlyn Kanner, PA-C  gabapentin (NEURONTIN) 300 MG capsule Take 1 capsule (300 mg total) by mouth at bedtime. 09/11/23   Lovorn, Aundra Millet, MD  Glucagon, rDNA, (GLUCAGON EMERGENCY) 1 MG KIT Inject 1 mg into the vein as needed (CBG of 65mg /dL).     [provider]  hydrALAZINE (APRESOLINE) 100 MG tablet Take 1 tablet (100 mg total) by mouth 3 (three) times daily. 11/01/23 12/01/23  Hughie Closs, MD  hydrocortisone (ANUSOL-HC) 2.5 % rectal cream Place rectally 4 (four) times daily. 02/14/23   Joseph Art, DO  insulin aspart (NOVOLOG) 100 UNIT/ML injection Inject 0-6 Units into the skin 3 (three) times daily with meals. CBG < 70: Implement Hypoglycemia Standing Orders and refer to Hypoglycemia Standing Orders sidebar report CBG 70 - 120: 0 units CBG 121 - 150: 0 units CBG 151 - 200: 1 unit CBG 201-250: 2 units CBG 251-300: 3 units CBG 301-350: 4 units CBG 351-400: 5 units CBG > 400: Give 6 units and call MD 06/29/22   Joycelyn Das, MD  Iron Sucrose (VENOFER IV) Inject 50 mg into the skin once a week. 08/16/23 10/18/24  [provider]  levETIRAcetam (KEPPRA) 250 MG tablet Take 1.5 tablets (375 mg total) by mouth every Monday, Wednesday, and Friday at 6 PM. 06/29/22   Pokhrel, Rebekah Chesterfield, MD  levETIRAcetam (KEPPRA) 750 MG tablet Take 1 tablet (750 mg total) by mouth at bedtime. 06/29/22   Pokhrel, Rebekah Chesterfield, MD  loratadine (CLARITIN) 10 MG tablet Take 10 mg by mouth daily.    [provider]  losartan (COZAAR) 50 MG tablet Take 1 tablet (50 mg total) by mouth 2 (two) times daily. 11/01/23 12/01/23  Hughie Closs, MD  Methoxy PEG-Epoetin Beta (MIRCERA IJ) Inject 1 Dose into the skin every 14 (fourteen) days. 04/19/23 08/28/24  [provider]  methylphenidate (RITALIN) 5 MG tablet Take 1 tablet (5 mg total) by mouth 2 (two) times daily with breakfast and lunch. 09/11/23   Lovorn, Aundra Millet, MD  mometasone-formoterol (DULERA) 200-5 MCG/ACT AERO Inhale 2 puffs into the lungs 2 (two) times daily. 04/15/22   Dorothyann Peng, MD  pantoprazole (PROTONIX) 40 MG tablet Take 1 tablet (40 mg total) by mouth daily. Patient taking differently: Take 40 mg by mouth at bedtime. 02/07/22   Dorothyann Peng, MD  prochlorperazine (COMPAZINE) 10 MG  tablet Take 1 tablet (10 mg total) by mouth every 8 (eight) hours as needed for nausea or vomiting. 02/15/23   Sherrilyn Rist, MD  rosuvastatin (CRESTOR) 20 MG tablet Take 1 tablet (20 mg total) by mouth daily. Patient taking differently: Take 20 mg by mouth at bedtime. 08/18/21   Charlesetta Ivory, NP  sevelamer carbonate (RENVELA) 0.8 g PACK packet Take 0.8 g by mouth 3 (three) times daily. 10/19/21   [provider]  ticagrelor (BRILINTA) 90 MG TABS tablet Take 1 tablet (90 mg total) by mouth 2 (two) times daily. 06/10/22   Willeen Niece, MD    Physical Exam: Vitals:   11/16/23 1441 11/16/23 1442 11/16/23 1820 11/16/23 1931  BP: (!) 163/101  (!) 164/95   Pulse: 99  81   Resp:   17   Temp:   98 F (36.7 C)   TempSrc:   Oral   SpO2: 92% (!) 88% 98% 100%   General: Patient on 3 L/min of supplementary oxygen.  Saturating 96% on SpO2 monitoring.  Patient does not appear to be distressed.  However patient is soft-spoken slow to respond and seems somnolent.  Patient is arousable, gives nonspecific history, as documented above.  Although she reports pain in the back upper back as knife being driven into the back.  She does not seem distress. Respiratory exam: Poor excursion expiratory wheezes diffusely.  Poor breath sounds Cardiovascular exam S1-S2 normal Abdomen soft nontender Extremities right below-knee amputation.  Otherwise no edema.  Warm Neurologically: Mentation as above.  Left upper extremity chronic appearing palsy.  Patient has bilateral lower extremity paraparesis that appears to be deconditioning. Data Reviewed:  Labs on Admission:  Results for orders placed or performed during the hospital encounter of 11/16/23 (from the past 24 hours)  Basic metabolic panel     Status: Abnormal   Collection Time: 11/16/23 10:44 AM  Result Value Ref Range   Sodium 136 135 - 145 mmol/L   Potassium 3.3 (L) 3.5 - 5.1 mmol/L   Chloride 93 (L) 98 - 111 mmol/L   CO2 28 22 - 32  mmol/L   Glucose, Bld 106 (H) 70 - 99 mg/dL   BUN 19 8 - 23 mg/dL   Creatinine, Ser 5.62 (H) 0.44 - 1.00 mg/dL   Calcium 8.0 (L) 8.9 - 10.3 mg/dL   GFR, Estimated 9 (L) >60 mL/min   Anion gap 15 5 - 15  CBC     Status: Abnormal   Collection Time: 11/16/23 10:44 AM  Result Value Ref Range   WBC 9.0 4.0 - 10.5 K/uL   RBC 3.34 (L) 3.87 - 5.11 MIL/uL   Hemoglobin 9.9 (L) 12.0 - 15.0 g/dL   HCT 13.0 (L) 86.5 - 78.4 %   MCV 87.7 80.0 - 100.0 fL   MCH 29.6 26.0 - 34.0 pg   MCHC 33.8 30.0 - 36.0 g/dL   RDW 69.6 (H) 29.5 - 28.4 %   Platelets 284 150 - 400 K/uL   nRBC 0.4 (H) 0.0 - 0.2 %  Troponin I (High Sensitivity)     Status: Abnormal   Collection Time: 11/16/23 10:44 AM  Result Value Ref Range   Troponin I (High Sensitivity) 205 (HH) <18 ng/L  CBG monitoring, ED     Status:  None   Collection Time: 11/16/23  4:46 PM  Result Value Ref Range   Glucose-Capillary 83 70 - 99 mg/dL  Troponin I (High Sensitivity)     Status: Abnormal   Collection Time: 11/16/23  4:51 PM  Result Value Ref Range   Troponin I (High Sensitivity) 199 (HH) <18 ng/L  I-Stat venous blood gas, ([facility] ED, [facility], [facility])     Status: Abnormal   Collection Time: 11/16/23  4:57 PM  Result Value Ref Range   pH, Ven 7.600 (H) 7.25 - 7.43   pCO2, Ven 24.9 (L) 44 - 60 mmHg   pO2, Ven 158 (H) 32 - 45 mmHg   Bicarbonate 24.5 20.0 - 28.0 mmol/L   TCO2 25 22 - 32 mmol/L   O2 Saturation 100 %   Acid-Base Excess 3.0 (H) 0.0 - 2.0 mmol/L   Sodium 137 135 - 145 mmol/L   Potassium 5.8 (H) 3.5 - 5.1 mmol/L   Calcium, Ion 0.66 (LL) 1.15 - 1.40 mmol/L   HCT 26.0 (L) 36.0 - 46.0 %   Hemoglobin 8.8 (L) 12.0 - 15.0 g/dL   Sample type VENOUS    Comment NOTIFIED PHYSICIAN    *Note: Due to a large number of results and/or encounters for the requested time period, some results have not been displayed. A complete set of results can be found in Results Review.   Basic Metabolic Panel: Recent Labs  Lab 11/16/23 1044 11/16/23 1657  NA  136 137  K 3.3* 5.8*  CL 93*  --   CO2 28  --   GLUCOSE 106*  --   BUN 19  --   CREATININE 4.79*  --   CALCIUM 8.0*  --    Liver Function Tests: No results for input(s): "AST", "ALT", "ALKPHOS", "BILITOT", "PROT", "ALBUMIN" in the last 168 hours. No results for input(s): "LIPASE", "AMYLASE" in the last 168 hours. No results for input(s): "AMMONIA" in the last 168 hours. CBC: Recent Labs  Lab 11/16/23 1044 11/16/23 1657  WBC 9.0  --   HGB 9.9* 8.8*  HCT 29.3* 26.0*  MCV 87.7  --   PLT 284  --    Cardiac Enzymes: Recent Labs  Lab 11/16/23 1044 11/16/23 1651  TROPONINIHS 205* 199*    BNP (last 3 results) No results for input(s): "PROBNP" in the last 8760 hours. CBG: Recent Labs  Lab 11/16/23 1646  GLUCAP 83    Radiological Exams on Admission:  CT Angio Chest PE W and/or Wo Contrast Result Date: 11/16/2023 CLINICAL DATA:  Chest pain, Pulmonary embolism (PE) suspected, high prob EXAM: CT ANGIOGRAPHY CHEST WITH CONTRAST TECHNIQUE: Multidetector CT imaging of the chest was performed using the standard protocol during bolus administration of intravenous contrast. Multiplanar CT image reconstructions and MIPs were obtained to evaluate the vascular anatomy. RADIATION DOSE REDUCTION: This exam was performed according to the departmental dose-optimization program which includes automated exposure control, adjustment of the mA and/or kV according to patient size and/or use of iterative reconstruction technique. CONTRAST:  75mL OMNIPAQUE IOHEXOL 350 MG/ML SOLN COMPARISON:  11/16/2023, 10/24/2023 FINDINGS: Cardiovascular: This is a technically adequate evaluation of the pulmonary vasculature. No filling defects or pulmonary emboli. The heart is enlarged without pericardial effusion. No evidence of thoracic aortic aneurysm or dissection. Stable aortic valve calcification, aortic atherosclerosis, and atherosclerosis throughout the coronary vasculature. Right internal jugular dialysis  catheter tip within the atriocaval junction. Mediastinum/Nodes: Stable right hilar adenopathy measuring up to 11 mm in short axis. Thyroid, trachea, and esophagus  are unremarkable. Small hiatal hernia. Lungs/Pleura: There are dependent areas of consolidation within the lungs bilaterally, favoring hypoventilatory changes over airspace disease. Trace bilateral pleural fluid. No pneumothorax. The central airways are patent. Upper Abdomen: No acute abnormality. Musculoskeletal: No acute or destructive bony abnormalities. Reconstructed images demonstrate no additional findings. Review of the MIP images confirms the above findings. IMPRESSION: 1. No evidence of pulmonary embolus. 2. Dependent areas of consolidation within the lungs bilaterally, favor hypoventilatory changes over airspace disease. 3. Trace bilateral pleural effusions. 4. Aortic Atherosclerosis (ICD10-I70.0). Coronary artery atherosclerosis. Electronically Signed   By: Sharlet Salina M.D.   On: 11/16/2023 19:07   CT HEAD WO CONTRAST ( ) Result Date: 11/16/2023 CLINICAL DATA:  Head trauma, minor (Age >= 65y) EXAM: CT HEAD WITHOUT CONTRAST TECHNIQUE: Contiguous axial images were obtained from the base of the skull through the vertex without intravenous contrast. RADIATION DOSE REDUCTION: This exam was performed according to the departmental dose-optimization program which includes automated exposure control, adjustment of the mA and/or kV according to patient size and/or use of iterative reconstruction technique. COMPARISON:  CT head 06/22/2022. FINDINGS: Brain: Infarct in the inferior right cerebellum which is new since the 06/12/2022. No evidence of acute hemorrhage, hydrocephalus, extra-axial collection or mass lesion/mass effect. Similar patchy white matter hypodensities, nonspecific but compatible with chronic microvascular ischemic disease. Similar remote perforator infarct in the right basal ganglia. Vascular: Calcific atherosclerosis.  No  hyperdense vessel. Skull: No acute fracture. Sinuses/Orbits: Moderate right maxillary sinus mucosal thickening. No acute orbital findings. Other: No mastoid effusions. IMPRESSION: 1. Infarct in the inferior right cerebellum which is new since 06/12/2022 but otherwise age indeterminate. If there is concern for acute infarct, recommend MRI head. 2. No acute hemorrhage. 3. Similar chronic microvascular ischemic changes. Electronically Signed   By: Feliberto Harts M.D.   On: 11/16/2023 19:06   DG Shoulder Left Result Date: 11/16/2023 CLINICAL DATA:  Left arm pain status post fall. EXAM: LEFT SHOULDER - 2+ VIEW COMPARISON:  None Available. FINDINGS: There is no evidence of acute fracture or dislocation. Mild degenerative changes of the glenohumeral and acromioclavicular joints. IMPRESSION: No acute osseous abnormality. Electronically Signed   By: Hart Robinsons M.D.   On: 11/16/2023 14:54   DG Chest 1 View Result Date: 11/16/2023 CLINICAL DATA:  Chest pain. EXAM: CHEST  1 VIEW COMPARISON:  Radiographs 10/24/2023 and 02/12/2023.  CT 10/24/2023. FINDINGS: 1157 hours. Right IJ hemodialysis catheter projects over the lower right atrium. Stable cardiomegaly and vascular congestion. There is chronic scarring or atelectasis at the left lung base. No edema, confluent airspace disease, significant pleural effusion or pneumothorax. The bones appear unchanged. Telemetry leads overlie the chest. IMPRESSION: Stable cardiomegaly and vascular congestion. No evidence of acute cardiopulmonary process. Electronically Signed   By: Carey Bullocks M.D.   On: 11/16/2023 12:14    chest X-ray  EKG: Independently reviewed. Sinus tqachy with PAC  No intake/output data recorded. No intake/output data recorded.       Assessment and Plan: * Acute encephalopathy Patient has chronic left upper extremity weakness.  Chronic right BKA.  Per report patient is typically alert awake oriented x 3.  However is noted to have  fluctuating mental status here in the ER.  Patient CAT scan head is showing age-indeterminate left cerebellar infarction.  Clinically speaking I think this is unlikely to be an acute stroke/CVA given hypoxemia on presentation, chest infiltrate on CT chest and patient's reported complaints of shortness of breath and chest pain.  At this time  I would like to pursue an MRI to rule out any acute CVA definitively.  And if positive we will engage with neurology.  However before proceeding with an MRI we will get an ABG because I suspect patient may be having metabolic encephalopathy given her mentation at this time. If abg non actiobale, will prceed with MRI.  CAP (community acquired pneumonia) Patient reports being newly short of breath.  Also having chest pain.  Noted to be hypoxic on presentation now requiring new 2 to 3 L/min of supplementary oxygen.  CAT scan images reviewed.  There seems to be a new infiltrate in the right middle lung field adjacent to the fissure.  Patient is also having wheezes and reduced breath sounds on exam I suspect she is also having an asthma exacerbation.  Patient is immunocompromised given her end-stage renal disease.  I will draw blood cultures and treat the patient with ceftriaxone and azithromycin as well as standing DuoNeb and inhaled corticosteroids and long-acting beta agonist as well as methylprednisolone.  GI prophylaxis with pantoprazole will be ordered  ESRD on dialysis (HCC) Chronic, patient does not appear volume overloaded or have acute electrolyte abnormalities.  please, consult renal in a.m. for restarting her dialysis.  Troponin I above reference range Troponin up to 205.  Rather flat trajectory.  I believe that this is due to hypoxemia that was documented in the ER.    Home med rec penidng pharmacy input. Keppra ordered based on priro med list. Glucose monitoring ordered. Restart diet once patient more awake.    Advance Care Planning:   Code Status:  Prior full code  Consults: cardiology consulted by ER.    Severity of Illness: The appropriate patient status for this patient is INPATIENT. Inpatient status is judged to be reasonable and necessary in order to provide the required intensity of service to ensure the patient's safety. The patient's presenting symptoms, physical exam findings, and initial radiographic and laboratory data in the context of their chronic comorbidities is felt to place them at high risk for further clinical deterioration. Furthermore, it is not anticipated that the patient will be medically stable for discharge from the hospital within 2 midnights of admission.   * I certify that at the point of admission it is my clinical judgment that the patient will require inpatient hospital care spanning beyond 2 midnights from the point of admission due to high intensity of service, high risk for further deterioration and high frequency of surveillance required.*  Author: Nolberto Hanlon, MD 11/16/2023 8:51 PM  For on call review [email or web address].

## 2023-11-17 ENCOUNTER — Inpatient Hospital Stay (HOSPITAL_COMMUNITY): Payer: Medicare PPO

## 2023-11-17 ENCOUNTER — Encounter (HOSPITAL_COMMUNITY): Payer: Self-pay | Admitting: Internal Medicine

## 2023-11-17 DIAGNOSIS — I6389 Other cerebral infarction: Secondary | ICD-10-CM | POA: Diagnosis not present

## 2023-11-17 DIAGNOSIS — R569 Unspecified convulsions: Secondary | ICD-10-CM

## 2023-11-17 DIAGNOSIS — R0602 Shortness of breath: Secondary | ICD-10-CM

## 2023-11-17 DIAGNOSIS — I639 Cerebral infarction, unspecified: Secondary | ICD-10-CM

## 2023-11-17 DIAGNOSIS — G9340 Encephalopathy, unspecified: Secondary | ICD-10-CM | POA: Diagnosis not present

## 2023-11-17 DIAGNOSIS — R451 Restlessness and agitation: Secondary | ICD-10-CM

## 2023-11-17 DIAGNOSIS — R079 Chest pain, unspecified: Secondary | ICD-10-CM | POA: Diagnosis not present

## 2023-11-17 DIAGNOSIS — R7989 Other specified abnormal findings of blood chemistry: Secondary | ICD-10-CM | POA: Diagnosis not present

## 2023-11-17 DIAGNOSIS — J210 Acute bronchiolitis due to respiratory syncytial virus: Secondary | ICD-10-CM

## 2023-11-17 LAB — BASIC METABOLIC PANEL
Anion gap: 15 (ref 5–15)
BUN: 22 mg/dL (ref 8–23)
CO2: 26 mmol/L (ref 22–32)
Calcium: 7.9 mg/dL — ABNORMAL LOW (ref 8.9–10.3)
Chloride: 98 mmol/L (ref 98–111)
Creatinine, Ser: 5.68 mg/dL — ABNORMAL HIGH (ref 0.44–1.00)
GFR, Estimated: 7 mL/min — ABNORMAL LOW (ref >60)
Glucose, Bld: 117 mg/dL — ABNORMAL HIGH (ref 70–99)
Potassium: 4.1 mmol/L (ref 3.5–5.1)
Sodium: 139 mmol/L (ref 135–145)

## 2023-11-17 LAB — I-STAT VENOUS BLOOD GAS, ED
Acid-Base Excess: 5 mmol/L — ABNORMAL HIGH (ref 0.0–2.0)
Bicarbonate: 27.1 mmol/L (ref 20.0–28.0)
Calcium, Ion: 0.75 mmol/L — CL (ref 1.15–1.40)
HCT: 29 % — ABNORMAL LOW (ref 36.0–46.0)
Hemoglobin: 9.9 g/dL — ABNORMAL LOW (ref 12.0–15.0)
O2 Saturation: 95 %
Potassium: 5.2 mmol/L — ABNORMAL HIGH (ref 3.5–5.1)
Sodium: 137 mmol/L (ref 135–145)
TCO2: 28 mmol/L (ref 22–32)
pCO2, Ven: 28.5 mm[Hg] — ABNORMAL LOW (ref 44–60)
pH, Ven: 7.585 — ABNORMAL HIGH (ref 7.25–7.43)
pO2, Ven: 61 mm[Hg] — ABNORMAL HIGH (ref 32–45)

## 2023-11-17 LAB — RESPIRATORY PANEL BY PCR

## 2023-11-17 LAB — URINALYSIS, COMPLETE (UACMP) WITH MICROSCOPIC
Bilirubin Urine: NEGATIVE
Glucose, UA: NEGATIVE mg/dL
Ketones, ur: NEGATIVE mg/dL
Nitrite: NEGATIVE
Protein, ur: >300 mg/dL — AB
Specific Gravity, Urine: 1.015 (ref 1.005–1.030)
pH: 8 (ref 5.0–8.0)

## 2023-11-17 LAB — SARS CORONAVIRUS 2 BY RT PCR: SARS Coronavirus 2 by RT PCR: POSITIVE — AB

## 2023-11-17 LAB — CBC
HCT: 30.3 % — ABNORMAL LOW (ref 36.0–46.0)
Hemoglobin: 10.3 g/dL — ABNORMAL LOW (ref 12.0–15.0)
MCH: 29.8 pg (ref 26.0–34.0)
MCHC: 34 g/dL (ref 30.0–36.0)
MCV: 87.6 fL (ref 80.0–100.0)
Platelets: 301 10*3/uL (ref 150–400)
RBC: 3.46 MIL/uL — ABNORMAL LOW (ref 3.87–5.11)
RDW: 19.4 % — ABNORMAL HIGH (ref 11.5–15.5)
WBC: 8 10*3/uL (ref 4.0–10.5)
nRBC: 0.6 % — ABNORMAL HIGH (ref 0.0–0.2)

## 2023-11-17 LAB — GLUCOSE, CAPILLARY
Glucose-Capillary: 128 mg/dL — ABNORMAL HIGH (ref 70–99)
Glucose-Capillary: 159 mg/dL — ABNORMAL HIGH (ref 70–99)

## 2023-11-17 LAB — CBG MONITORING, ED
Glucose-Capillary: 126 mg/dL — ABNORMAL HIGH (ref 70–99)
Glucose-Capillary: 80 mg/dL (ref 70–99)
Glucose-Capillary: 96 mg/dL (ref 70–99)

## 2023-11-17 LAB — LIPID PANEL
Cholesterol: 90 mg/dL (ref 0–200)
HDL: 38 mg/dL — ABNORMAL LOW (ref >40)
LDL Cholesterol: 37 mg/dL (ref 0–99)
Total CHOL/HDL Ratio: 2.4 {ratio}
Triglycerides: 76 mg/dL (ref <150)
VLDL: 15 mg/dL (ref 0–40)

## 2023-11-17 LAB — ECHOCARDIOGRAM LIMITED
AV Mean grad: 11 mm[Hg]
AV Peak grad: 18.7 mm[Hg]
Ao pk vel: 2.16 m/s
Area-P 1/2: 4.8 cm2

## 2023-11-17 LAB — PROCALCITONIN: Procalcitonin: 0.86 ng/mL

## 2023-11-17 LAB — PROTIME-INR
INR: 1.1 (ref 0.8–1.2)
Prothrombin Time: 14.7 s (ref 11.4–15.2)

## 2023-11-17 LAB — MRSA NEXT GEN BY PCR, NASAL: MRSA by PCR Next Gen: NOT DETECTED

## 2023-11-17 LAB — I-STAT CG4 LACTIC ACID, ED: Lactic Acid, Venous: 0.9 mmol/L (ref 0.5–1.9)

## 2023-11-17 LAB — APTT: aPTT: 37 s — ABNORMAL HIGH (ref 24–36)

## 2023-11-17 LAB — PHOSPHORUS: Phosphorus: 4.7 mg/dL — ABNORMAL HIGH (ref 2.5–4.6)

## 2023-11-17 LAB — TSH: TSH: 1.525 u[IU]/mL (ref 0.350–4.500)

## 2023-11-17 LAB — AMMONIA: Ammonia: 17 umol/L (ref 9–35)

## 2023-11-17 MED ORDER — PHENYLEPHRINE IN HARD FAT 0.25 % RE SUPP: 1.0000 | Freq: Two times a day (BID) | RECTAL | Status: AC | PRN

## 2023-11-17 MED ORDER — ROSUVASTATIN CALCIUM 20 MG PO TABS
20.0000 mg | ORAL_TABLET | Freq: Every day | ORAL | Status: DC
  Administered 2023-11-17 – 2023-11-20 (×4): 20 mg via ORAL
  Filled 2023-11-17 (×4): qty 1

## 2023-11-17 MED ORDER — PHENYLEPHRINE IN HARD FAT 0.25 % RE SUPP
1.0000 | Freq: Two times a day (BID) | RECTAL | Status: AC
  Filled 2023-11-17 (×4): qty 1

## 2023-11-17 MED ORDER — ASPIRIN 325 MG PO TABS
325.0000 mg | ORAL_TABLET | Freq: Every day | ORAL | Status: DC
  Administered 2023-11-18: 325 mg via ORAL
  Filled 2023-11-17: qty 1

## 2023-11-17 MED ORDER — SODIUM CHLORIDE 0.9 % IV SOLN: 750.0000 mg | Freq: Every day | INTRAVENOUS | Status: DC

## 2023-11-17 MED ORDER — STROKE: EARLY STAGES OF RECOVERY BOOK
Freq: Once | Status: DC
  Filled 2023-11-17: qty 1

## 2023-11-17 MED ORDER — CHLORHEXIDINE GLUCONATE CLOTH 2 % EX PADS
6.0000 | MEDICATED_PAD | Freq: Every day | CUTANEOUS | Status: DC
  Administered 2023-11-17: 6 via TOPICAL

## 2023-11-17 MED ORDER — WITCH HAZEL-GLYCERIN EX PADS: MEDICATED_PAD | CUTANEOUS | Status: DC | PRN

## 2023-11-17 MED ORDER — HEPARIN SODIUM (PORCINE) 1000 UNIT/ML IJ SOLN
INTRAMUSCULAR | Status: AC
  Filled 2023-11-17: qty 4

## 2023-11-17 MED ORDER — SEVELAMER CARBONATE 800 MG PO TABS
800.0000 mg | ORAL_TABLET | Freq: Three times a day (TID) | ORAL | Status: DC
  Administered 2023-11-17 – 2023-11-22 (×9): 800 mg via ORAL
  Filled 2023-11-17 (×12): qty 1

## 2023-11-17 MED ORDER — HEPARIN SODIUM (PORCINE) 5000 UNIT/ML IJ SOLN
5000.0000 [IU] | Freq: Three times a day (TID) | INTRAMUSCULAR | Status: DC
  Administered 2023-11-17 – 2023-12-28 (×122): 5000 [IU] via SUBCUTANEOUS
  Filled 2023-11-17 (×122): qty 1

## 2023-11-17 MED ORDER — METHYLPREDNISOLONE SODIUM SUCC 125 MG IJ SOLR
60.0000 mg | Freq: Two times a day (BID) | INTRAMUSCULAR | Status: AC
  Administered 2023-11-17: 60 mg via INTRAVENOUS
  Filled 2023-11-17: qty 2

## 2023-11-17 MED ORDER — SODIUM CHLORIDE 0.9 % IV SOLN
375.0000 mg | INTRAVENOUS | Status: DC
  Administered 2023-11-17 – 2023-11-20 (×2): 380 mg via INTRAVENOUS
  Filled 2023-11-17 (×4): qty 3.8

## 2023-11-17 MED ORDER — LABETALOL HCL 5 MG/ML IV SOLN
20.0000 mg | INTRAVENOUS | Status: DC | PRN
  Administered 2023-11-18 – 2023-12-14 (×20): 20 mg via INTRAVENOUS
  Filled 2023-11-17 (×24): qty 4

## 2023-11-17 MED ORDER — METHYLPREDNISOLONE SODIUM SUCC 40 MG IJ SOLR
40.0000 mg | INTRAMUSCULAR | Status: AC
  Administered 2023-11-18 – 2023-11-22 (×5): 40 mg via INTRAVENOUS
  Filled 2023-11-17 (×5): qty 1

## 2023-11-17 MED ORDER — HYDRALAZINE HCL 20 MG/ML IJ SOLN
10.0000 mg | INTRAMUSCULAR | Status: DC | PRN
  Administered 2023-11-17 – 2023-11-25 (×4): 10 mg via INTRAVENOUS
  Filled 2023-11-17 (×5): qty 1

## 2023-11-17 MED ORDER — NITROGLYCERIN 0.4 MG SL SUBL
0.4000 mg | SUBLINGUAL_TABLET | SUBLINGUAL | Status: DC | PRN
  Administered 2023-11-17 (×3): 0.4 mg via SUBLINGUAL
  Filled 2023-11-17 (×3): qty 1

## 2023-11-17 MED ORDER — BISACODYL 10 MG RE SUPP
10.0000 mg | Freq: Every day | RECTAL | Status: DC | PRN
  Administered 2023-11-28: 10 mg via RECTAL
  Filled 2023-11-17: qty 1

## 2023-11-17 MED ORDER — POLYETHYLENE GLYCOL 3350 17 G PO PACK
17.0000 g | PACK | Freq: Two times a day (BID) | ORAL | Status: DC
  Administered 2023-11-17 – 2023-11-22 (×6): 17 g via ORAL
  Filled 2023-11-17 (×8): qty 1

## 2023-11-17 MED ORDER — TICAGRELOR 90 MG PO TABS
90.0000 mg | ORAL_TABLET | Freq: Two times a day (BID) | ORAL | Status: DC
  Administered 2023-11-17 – 2023-11-22 (×11): 90 mg via ORAL
  Filled 2023-11-17 (×12): qty 1

## 2023-11-17 MED ORDER — GUAIFENESIN ER 600 MG PO TB12
600.0000 mg | ORAL_TABLET | Freq: Two times a day (BID) | ORAL | Status: DC
  Administered 2023-11-17 – 2023-11-22 (×11): 600 mg via ORAL
  Filled 2023-11-17 (×11): qty 1

## 2023-11-17 MED ORDER — BISACODYL 5 MG PO TBEC
10.0000 mg | DELAYED_RELEASE_TABLET | Freq: Every day | ORAL | Status: DC
  Administered 2023-11-17 – 2023-11-22 (×5): 10 mg via ORAL
  Filled 2023-11-17 (×6): qty 2

## 2023-11-17 MED ORDER — SODIUM CHLORIDE 0.9 % IV SOLN
750.0000 mg | Freq: Two times a day (BID) | INTRAVENOUS | Status: DC
  Administered 2023-11-18 – 2023-11-20 (×5): 750 mg via INTRAVENOUS
  Filled 2023-11-17 (×8): qty 7.5

## 2023-11-17 MED ORDER — BISACODYL 5 MG PO TBEC
10.0000 mg | DELAYED_RELEASE_TABLET | Freq: Once | ORAL | Status: AC
  Administered 2023-11-17: 10 mg via ORAL
  Filled 2023-11-17: qty 2

## 2023-11-17 MED ORDER — CHLORHEXIDINE GLUCONATE CLOTH 2 % EX PADS
6.0000 | MEDICATED_PAD | Freq: Every day | CUTANEOUS | Status: DC
  Administered 2023-11-17 – 2023-12-03 (×14): 6 via TOPICAL

## 2023-11-17 MED ORDER — ASPIRIN 300 MG RE SUPP
300.0000 mg | Freq: Every day | RECTAL | Status: DC
  Administered 2023-11-17: 300 mg via RECTAL
  Filled 2023-11-17 (×2): qty 1

## 2023-11-17 MED ORDER — SMOG ENEMA: 960.0000 mL | Freq: Every day | RECTAL | Status: DC | PRN

## 2023-11-17 MED ORDER — HYDROCOD POLI-CHLORPHE POLI ER 10-8 MG/5ML PO SUER
5.0000 mL | Freq: Two times a day (BID) | ORAL | Status: DC | PRN
  Administered 2023-11-20 – 2023-11-27 (×3): 5 mL via ORAL
  Filled 2023-11-17 (×4): qty 5

## 2023-11-17 MED ORDER — DM-GUAIFENESIN ER 30-600 MG PO TB12
1.0000 | ORAL_TABLET | Freq: Two times a day (BID) | ORAL | Status: DC
  Filled 2023-11-17 (×3): qty 1

## 2023-11-17 MED ORDER — APIXABAN 2.5 MG PO TABS: 2.5000 mg | ORAL_TABLET | Freq: Every day | ORAL | Status: DC

## 2023-11-17 NOTE — Consult Note (Signed)
NEUROLOGY CONSULT NOTE   Date of service: November 17, 2023 Patient Name: Carolyn Russo MRN:  [id number] DOB:  11/19/1950 Chief Complaint: Confusion and worsened weakness Requesting Provider: Nolberto Hanlon, MD  History of Present Illness  Carolyn Russo is a 73 y.o. female with a PMHx of ESRD on HD (MWF), CHF, CVA with residual left-sided weakness, type 2 diabetes, right BKA, seizure disorder (on Keppra 750 mg qhs renal dosing, with supplemental 1/2 dose on HD days), migraines, Hyperlipidemia, Hypertension and Obesity, who presented to the ED from her SNF on Thursday morning with new onset of left sided CP. Additionally, staff reported that she is usually A/Ox4, but was seeming a little confused & was weaker than baseline when attempting to get OOB. CT head revealed an infarct in the inferior right cerebellum which was new since 06/12/2022, but otherwise age indeterminate.  MRI brain was then obtained, revealing an acute right inferomedial cerebellar ischemic infarction in the right PICA territory.  Following MRI brain, she has had intermittent somnolence x 2, despite not requiring sedation for the scan.     ROS  Unable to obtain due to somnolence.    Past History   Past Medical History:  Diagnosis Date   Acute GI bleeding    Allergy    Anemia    Anterior chest wall pain    Appendicitis 1965   Asthma    Body mass index 37.0-37.9, adult    Breast pain    Cataract    both eyes   CHF (congestive heart failure) (HCC)    Chronic kidney disease    stage 5 - on dialysis   Cognitive change 04/20/2021   r/t cva 03/2821   Complication of anesthesia    Memory loss after general   Dehydration 2014   Deviated septum 1971   Diabetes mellitus    Dysphagia due to old stroke    easy to get strangled when eating   Dyspnea 2014   Extrinsic asthma    WITH ASTHMA ATTACK   Fibroid 1980   GERD (gastroesophageal reflux disease)    Heart murmur    History of migraine    History of seizure     with stroke   Hx gestational diabetes    Hyperlipidemia    Hypertension 2014   Inguinal hernia 1959   Malaise and fatigue 2014   Non-IgE mediated allergic asthma 2014   Obesity    Pelvic pain    Pregnancy, high-risk 1985   Stroke (HCC) 04/20/2021   (CVA) of right basal ganglia   Tonsillitis 1968   Uterine fibroid 1980   Visual field defect    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    Past Surgical History:  Procedure Laterality Date   ABDOMINAL AORTOGRAM W/LOWER EXTREMITY N/A 02/21/2022   Procedure: ABDOMINAL AORTOGRAM W/LOWER EXTREMITY;  Surgeon: Maeola Harman, MD;  Location: [facility] INVASIVE CV LAB;  Service: Cardiovascular;  Laterality: N/A;   AMPUTATION Right 05/18/2022   Procedure: RIGHT BELOW KNEE AMPUTATION;  Surgeon: Nadara Mustard, MD;  Location: [facility] OR;  Service: Orthopedics;  Laterality: Right;   AMPUTATION TOE Right 04/15/2022   Procedure: AMPUTATION  LST TOERIGHT FOOT;  Surgeon: Edwin Cap, DPM;  Location: [facility] ORS;  Service: Podiatry;  Laterality: Right;   APPENDECTOMY  1959   BASCILIC VEIN TRANSPOSITION Right 04/30/2021   Procedure: RIGHT FIRST STAGE BASCILIC VEIN TRANSPOSITION;  Surgeon: Chuck Hint, MD;  Location: [facility] OR;  Service: Vascular;  Laterality: Right;   CESAREAN  SECTION  1985   COLONOSCOPY     ESOPHAGOGASTRODUODENOSCOPY (EGD) WITH PROPOFOL N/A 04/18/2021   Procedure: ESOPHAGOGASTRODUODENOSCOPY (EGD) WITH PROPOFOL;  Surgeon: Iva Boop, MD;  Location: [facility] ENDOSCOPY;  Service: Endoscopy;  Laterality: N/A;   EYE SURGERY     bilateral cataract    FLEXIBLE SIGMOIDOSCOPY N/A 04/18/2021   Procedure: FLEXIBLE SIGMOIDOSCOPY;  Surgeon: Iva Boop, MD;  Location: [facility] ENDOSCOPY;  Service: Endoscopy;  Laterality: N/A;   HERNIA REPAIR  1959   IR FLUORO GUIDE CV LINE RIGHT  03/18/2021   IR US GUIDE VASC ACCESS RIGHT  03/18/2021   LEFT HEART CATH AND CORONARY ANGIOGRAPHY N/A 04/04/2017   Procedure: Left Heart Cath and Coronary Angiography;  Surgeon: Lyn Records,  MD;  Location: [facility] INVASIVE CV LAB;  Service: Cardiovascular;  Laterality: N/A;   MYOMECTOMY  1980, 2004, 2007   PERIPHERAL VASCULAR THROMBECTOMY  02/21/2022   Procedure: PERIPHERAL VASCULAR THROMBECTOMY;  Surgeon: Maeola Harman, MD;  Location: [facility] INVASIVE CV LAB;  Service: Cardiovascular;;  Right Tibial   RHINOPLASTY  1971   ROTATOR CUFF REPAIR  2003   SURGICAL REPAIR OF HEMORRHAGE  2015   TONSILLECTOMY  1968    Family History: Family History  Problem Relation Age of Onset   Psoriasis Mother    Melanoma Mother        abdominal   Alzheimer's disease Father    Diabetes Maternal Aunt    Colon cancer Cousin    Diabetes Cousin    Colon polyps Neg Hx    Esophageal cancer Neg Hx    Cancer - Colon Neg Hx    Liver disease Neg Hx    BRCA 1/2 Neg Hx    Breast cancer Neg Hx     Social History  reports that she has never smoked. She has never used smokeless tobacco. She reports that she does not drink alcohol and does not use drugs.  Allergies  Allergen Reactions   Almond Oil Anaphylaxis   Food Anaphylaxis    Peanuts - anaphylaxis Almonds - anaphylaxis Not listed on MAR   Wheat Other (See Comments)    Upset stomach   Not listed on MAR  Other Reaction(s): abdominal pain   Statins Itching and Other (See Comments)    Generalized aches- tolerates crestor  Not listed on MAR   Gadolinium Derivatives Other (See Comments)    Gadolinium-Containing Contrast Media Listed as an allergy on MAR Unknown reaction   Januvia [Sitagliptin] Other (See Comments)    Listed as an allergy on MAR Unknown reaction   Pork-Derived Products Other (See Comments)    Does not eat pork  Listed on MAR   Shellfish Allergy Other (See Comments)    Mouth gets raw Listed on MAR   Tetracycline Other (See Comments)    Raw mouth  Not listed on MAR  Other Reaction(s): Not available   Tetracycline Hcl     Other reaction(s): "raw mouth"  Other Reaction(s): "raw mouth"    Medications    Current Facility-Administered Medications:    [START ON 11/18/2023]  stroke: early stages of recovery book, , Does not apply, Once, Nolberto Hanlon, MD   acetaminophen (TYLENOL) tablet 650 mg, 650 mg, Oral, Q6H PRN **OR** acetaminophen (TYLENOL) suppository 650 mg, 650 mg, Rectal, Q6H PRN, Nolberto Hanlon, MD   apixaban (ELIQUIS) tablet 2.5 mg, 2.5 mg, Oral, Daily, Nolberto Hanlon, MD   aspirin suppository 300 mg, 300 mg, Rectal, Daily **OR** aspirin tablet 325 mg, 325 mg, Oral,  Daily, Nolberto Hanlon, MD   azithromycin [facility]) 500 mg in sodium chloride 0.9 % 250 mL IVPB, 500 mg, Intravenous, Q24H, Nolberto Hanlon, MD, Stopped at 11/17/23 0154   cefTRIAXone (ROCEPHIN) 2 g in sodium chloride 0.9 % 100 mL IVPB, 2 g, Intravenous, Q24H, Nolberto Hanlon, MD, Stopped at 11/17/23 0020   fluticasone furoate-vilanterol (BREO ELLIPTA) 200-25 MCG/ACT 1 puff, 1 puff, Inhalation, Daily, Nolberto Hanlon, MD   hydrALAZINE (APRESOLINE) injection 10 mg, 10 mg, Intravenous, Q4H PRN, Nolberto Hanlon, MD, 10 mg at 11/17/23 0041   insulin aspart (novoLOG) injection 0-5 Units, 0-5 Units, Subcutaneous, QHS, Nolberto Hanlon, MD   insulin aspart (novoLOG) injection 0-6 Units, 0-6 Units, Subcutaneous, TID WC, Nolberto Hanlon, MD   ipratropium-albuterol (DUONEB) 0.5-2.5 (3) MG/3ML nebulizer solution 3 mL, 3 mL, Nebulization, Q4H, Nolberto Hanlon, MD, 3 mL at 11/17/23 0036   labetalol (NORMODYNE) injection 20 mg, 20 mg, Intravenous, Q2H PRN, Nolberto Hanlon, MD   levETIRAcetam (KEPPRA) tablet 375 mg, 375 mg, Oral, Q M,W,F-1800, Nolberto Hanlon, MD   levETIRAcetam (KEPPRA) tablet 750 mg, 750 mg, Oral, QHS, Nolberto Hanlon, MD   methylPREDNISolone sodium succinate (SOLU-MEDROL) 125 mg/2 mL injection 60 mg, 60 mg, Intravenous, Q12H, Nolberto Hanlon, MD, 60 mg at 11/16/23 2235   pantoprazole (PROTONIX) injection 40 mg, 40 mg, Intravenous, Q24H, Nolberto Hanlon, MD, 40 mg at 11/16/23 2234   polyethylene glycol (MIRALAX / GLYCOLAX) packet 17 g, 17 g, Oral, Daily PRN, Nolberto Hanlon, MD    sodium chloride flush (NS) 0.9 % injection 3 mL, 3 mL, Intravenous, Q12H, Nolberto Hanlon, MD, 3 mL at 11/16/23 2216  Current Outpatient Medications:    acetaminophen (TYLENOL) 500 MG tablet, Take 1,000 mg by mouth 2 (two) times daily as needed (leg and arm pain)., Disp: , Rfl:    albuterol (PROVENTIL) (2.5 MG/3ML) 0.083% nebulizer solution, Use 1 vial (2.5 mg total) by nebulization every 4 (four) hours as needed for wheezing or shortness of breath., Disp: 90 mL, Rfl: 0   amitriptyline (ELAVIL) 25 MG tablet, Take 0.5-1 tablets (12.5-25 mg total) by mouth at bedtime., Disp: 30 tablet, Rfl: 5   aspirin EC 81 MG tablet, Take 1 tablet (81 mg total) by mouth daily. Swallow whole., Disp: 30 tablet, Rfl: 12   calcitRIOL (ROCALTROL) 0.5 MCG capsule, Take 0.5 mcg by mouth See admin instructions. Only takes on Dialysis days, Disp: , Rfl:    cinacalcet (SENSIPAR) 30 MG tablet, Take 30 mg by mouth daily., Disp: , Rfl:    cyclobenzaprine (FLEXERIL) 5 MG tablet, Take 1 tablet (5 mg total) by mouth 3 (three) times daily as needed for muscle spasms., Disp: 30 tablet, Rfl: 0   diclofenac Sodium (VOLTAREN) 1 % GEL, Apply 2 g topically 4 (four) times daily. To left wrist, Disp: 150 g, Rfl: 0   gabapentin (NEURONTIN) 300 MG capsule, Take 1 capsule (300 mg total) by mouth at bedtime., Disp: 90 capsule, Rfl: 1   Glucagon, rDNA, (GLUCAGON EMERGENCY) 1 MG KIT, Inject 1 mg into the vein as needed (CBG of 65mg /dL)., Disp: , Rfl:    hydrALAZINE (APRESOLINE) 100 MG tablet, Take 1 tablet (100 mg total) by mouth 3 (three) times daily., Disp: 90 tablet, Rfl: 0   hydrocortisone (ANUSOL-HC) 2.5 % rectal cream, Place rectally 4 (four) times daily., Disp: 30 g, Rfl: 0   insulin aspart (NOVOLOG) 100 UNIT/ML injection, Inject 0-6 Units into the skin 3 (three) times daily with meals. CBG < 70: Implement Hypoglycemia Standing Orders and refer to Hypoglycemia Standing Orders  sidebar report CBG 70 - 120: 0 units CBG 121 - 150: 0 units CBG 151 -  200: 1 unit CBG 201-250: 2 units CBG 251-300: 3 units CBG 301-350: 4 units CBG 351-400: 5 units CBG > 400: Give 6 units and call MD, Disp: , Rfl:    Iron Sucrose (VENOFER IV), Inject 50 mg into the skin once a week., Disp: , Rfl:    levETIRAcetam (KEPPRA) 250 MG tablet, Take 1.5 tablets (375 mg total) by mouth every Monday, Wednesday, and Friday at 6 PM., Disp: , Rfl:    levETIRAcetam (KEPPRA) 750 MG tablet, Take 1 tablet (750 mg total) by mouth at bedtime., Disp: , Rfl:    loratadine (CLARITIN) 10 MG tablet, Take 10 mg by mouth daily., Disp: , Rfl:    losartan (COZAAR) 50 MG tablet, Take 1 tablet (50 mg total) by mouth 2 (two) times daily., Disp: 60 tablet, Rfl: 0   Methoxy PEG-Epoetin Beta (MIRCERA IJ), Inject 1 Dose into the skin every 14 (fourteen) days., Disp: , Rfl:    methylphenidate (RITALIN) 5 MG tablet, Take 1 tablet (5 mg total) by mouth 2 (two) times daily with breakfast and lunch., Disp: 60 tablet, Rfl: 0   mometasone-formoterol (DULERA) 200-5 MCG/ACT AERO, Inhale 2 puffs into the lungs 2 (two) times daily., Disp: 3 each, Rfl: 2   pantoprazole (PROTONIX) 40 MG tablet, Take 1 tablet (40 mg total) by mouth daily. (Patient taking differently: Take 40 mg by mouth at bedtime.), Disp: 90 tablet, Rfl: 2   prochlorperazine (COMPAZINE) 10 MG tablet, Take 1 tablet (10 mg total) by mouth every 8 (eight) hours as needed for nausea or vomiting., Disp: 60 tablet, Rfl: 2   rosuvastatin (CRESTOR) 20 MG tablet, Take 1 tablet (20 mg total) by mouth daily. (Patient taking differently: Take 20 mg by mouth at bedtime.), Disp: 90 tablet, Rfl: 2   sevelamer carbonate (RENVELA) 0.8 g PACK packet, Take 0.8 g by mouth 3 (three) times daily., Disp: , Rfl:    ticagrelor (BRILINTA) 90 MG TABS tablet, Take 1 tablet (90 mg total) by mouth 2 (two) times daily., Disp: 60 tablet, Rfl: 3  Vitals   Vitals:   11/16/23 2127 11/16/23 2300 11/17/23 0030 11/17/23 0036  BP:  (!) 165/89 (!) 192/101 (!) 192/106  Pulse: 83 82     Resp: 16 (!) 22 18 18   Temp:      TempSrc:      SpO2: 100% 100% 100% 100%    There is no height or weight on file to calculate BMI.  Physical Exam   Physical Exam  HEENT-  [place]/AT. Neck is supple. Nondiaphoretic. Skin temp normal to touch.     Lungs- Respirations unlabored but sonorous while asleep Extremities- Right BKA. No edema.   Neurological Examination Mental Status: Somnolent. Needs constant stimulation to remain awake. While briefly awake she follows some simple commands but is unable to attend sufficiently to perform complex motor commands. Does not answer orientation questions or name objects but can count fingers (consistently falls back asleep within 5 seconds of awakening to sternal rub). Did briefly exclaim and make eye contact after sternal rub. One word utterances only. Mild dysarthria.  Cranial Nerves: II: Did not attend sufficiently for testing of visual fields to confrontation or with blink-to-threat. PERRL  III,IV, VI: Eyes are conjugate and will briefly track to left and right. No nystagmus.   V: Reacts to touch bilaterally  VII: No gross asymmetry, but did not grimace or smile to  noxious or command.  VIII: Hearing intact to voice IX,X: No hoarseness XI: Unable to assess XII: Did protrude tongue, which was midline Motor: LUE with tonic flexion of digits and wrist, and slightly increased tone at these joints. Did elevate to command and grip but with less strength than on right.  RUE: Elevates antigravity briefly. Grip and elbow flexion max strength elicited is 4/5 (rapidly falls asleep during testing) BLE does not move to command, but withdraws bilatrrally to noxious, left less briskly than right.  Sensory: Reacts to noxious x 4  Deep Tendon Reflexes: Intact patellar and brachioradialis reflexes, unclear if there is asymmetry.  Cerebellar: Not able to follow commands for assessment  Gait: Deferred   Labs/Imaging/Neurodiagnostic studies   CBC:  Recent Labs   Lab 11/16/23 1044 11/16/23 1657 11/16/23 2115 11/17/23 0012  WBC 9.0  --   --   --   HGB 9.9*   < > 11.2* 9.9*  HCT 29.3*   < > 33.0* 29.0*  MCV 87.7  --   --   --   PLT 284  --   --   --    < > = values in this interval not displayed.   Basic Metabolic Panel:  Lab Results  Component Value Date   NA 137 11/17/2023   K 5.2 (H) 11/17/2023   CO2 28 11/16/2023   GLUCOSE 106 (H) 11/16/2023   BUN 19 11/16/2023   CREATININE 4.79 (H) 11/16/2023   CALCIUM 8.0 (L) 11/16/2023   GFRNONAA 9 (L) 11/16/2023   GFRAA 17 06/19/2020   Lipid Panel:  Lab Results  Component Value Date   LDLCALC 24 06/05/2022   HgbA1c:  Lab Results  Component Value Date   HGBA1C 5.8 (H) 10/26/2023   Urine Drug Screen: No results found for: "LABOPIA", "COCAINSCRNUR", "LABBENZ", "AMPHETMU", "THCU", "LABBARB"  Alcohol Level No results found for: "ETH" INR  Lab Results  Component Value Date   INR 1.0 06/22/2022   APTT  Lab Results  Component Value Date   APTT 29 04/14/2021   TTE 10/25/23:  1. Left ventricular ejection fraction, by estimation, is 55 to 60%. The  left ventricle has normal function. The left ventricle has no regional  wall motion abnormalities. There is moderate left ventricular hypertrophy.  Left ventricular diastolic parameters are indeterminate. Elevated left atrial pressure.   2. Right ventricular systolic function is normal. The right ventricular  size is normal. There is mildly elevated pulmonary artery systolic  pressure.   3. The mitral valve is normal in structure. No evidence of mitral valve  regurgitation. No evidence of mitral stenosis.   4. The tricuspid valve is abnormal.   5. The aortic valve is tricuspid. There is mild calcification of the  aortic valve. There is mild thickening of the aortic valve. Aortic valve  regurgitation is not visualized. No aortic stenosis is present.   ASSESSMENT  73 y.o. female with a PMHx of ESRD on HD (MWF), CHF, CVA with residual  left-sided weakness, type 2 diabetes, right BKA, seizure disorder (on Keppra 750 mg qhs renal dosing, with supplemental 1/2 dose on HD days), migraines, Hyperlipidemia, Hypertension and Obesity, who presented to the ED from her SNF on Thursday morning with new onset of left sided CP. Additionally, staff reported that she is usually A/Ox4, but was seeming a little confused & was weaker than baseline when attempting to get OOB. CT head revealed an infarct in the inferior right cerebellum which was new since 06/12/2022, but otherwise  age indeterminate. MRI brain was then obtained, revealing an acute right inferomedial cerebellar ischemic infarction in the right PICA territory. Following MRI brain, she has had intermittent somnolence x 2, despite not requiring sedation for the scan.   - Exam reveals a somnolent patient with chronic left sided UMN findings.  - MRI brain: 1.3 cm acute to early subacute right PICA distribution infarct. No associated hemorrhage or significant mass effect. Loss of normal flow void within the intradural right V4 segment, which could reflect slow flow and/or occlusion. This appears to be new from prior CTA from 06/22/2022. Underlying moderate to advanced chronic microvascular ischemic disease with remote lacunar infarcts involving the right basal ganglia and left cerebellum. Underlying atrophy, most pronounced about the frontal lobes bilaterally.  Impression: - Acute medial-inferior right cerebellar infarction. DDx includes cardioembolic and atherothrombotic - Fluctuating mentation. DDx includes toxic/metabolic encephalopathy versus her known CAP with SOB and hypoxia on presentation now requiring supplemental O2. Also possible would be intermittent subclinical seizures with postictal state.   RECOMMENDATIONS  - She is currently on once a day low dose Eliquis (2.5 mg) for DVT prophylaxis - Continue ASA - Continue her home Keppra dosing regimen for seizure prevention - Brilinta and  ASA are on her home medications list. Marden Noble has not been ordered by admitting team this admission. I have contacted her admitting team for clarification.  - EEG (ordered) - Avoid sedating medications. Her Neurontin and Flexeril have not bee restarted. Agree with holding for now.  - Continue her home rosuvastatin.  - Not a candidate for permissive HTN. Based on the MRI appearance, the subacute right cerebellar infarction is most likely > 24 hours old.  - MRA of head.  - Carotid ultrasound - Has had recent TTE (see above) - Cardiac telemetry - HgbA1c, fasting lipid panel - PT consult, OT consult, Speech consult - Frequent neuro checks - NPO until passes stroke swallow screen - IVF per primary team. She is not currently able to take PO due to somnolence. May need input from Nephrology   ______________________________________________________________________    Dessa Phi, Kenetra Hildenbrand, MD [facility]

## 2023-11-17 NOTE — Progress Notes (Addendum)
Progress Note  Patient Name: Carolyn Russo Date of Encounter: 11/17/2023  [facility] Cardiologist: Chilton Si, MD   Patient Profile     Subjective   Patient very agitated and screaming out this morning.  Positive for RSV.  EEG done this morning consistent with focal epilepsy arising from the right temporoparietal region with diffuse encephalopathy.  Still continues to complain of chest pain both with deep inspiration and constant with shortness of breath which is unchanged.  Unclear if she is cognizant enough to answer questions appropriately Inpatient Medications    Scheduled Meds:  [START ON 11/18/2023]  stroke: early stages of recovery book   Does not apply Once   aspirin  300 mg Rectal Daily   Or   aspirin  325 mg Oral Daily   fluticasone furoate-vilanterol  1 puff Inhalation Daily   heparin injection (subcutaneous)  5,000 Units Subcutaneous Q8H   insulin aspart  0-5 Units Subcutaneous QHS   insulin aspart  0-6 Units Subcutaneous TID WC   ipratropium-albuterol  3 mL Nebulization Q4H   methylPREDNISolone (SOLU-MEDROL) injection  60 mg Intravenous Q12H   pantoprazole (PROTONIX) IV  40 mg Intravenous Q24H   sodium chloride flush  3 mL Intravenous Q12H   ticagrelor  90 mg Oral BID   Continuous Infusions:  azithromycin Stopped (11/17/23 0154)   cefTRIAXone (ROCEPHIN)  IV Stopped (11/17/23 0020)   levETIRAcetam     levETIRAcetam     PRN Meds: acetaminophen **OR** acetaminophen, hydrALAZINE, labetalol, nitroGLYCERIN, polyethylene glycol   Vital Signs    Vitals:   11/17/23 0515 11/17/23 0545 11/17/23 0700 11/17/23 0856  BP: 135/82 125/70  111/78  Pulse: 95 93  95  Resp: 19 19  18   Temp:   98.3 F (36.8 C) 97.8 F (36.6 C)  TempSrc:   Oral Oral  SpO2: 100% 100%  100%    Intake/Output Summary (Last 24 hours) at 11/17/2023 1050 Last data filed at 11/16/2023 2216 Gross per 24 hour  Intake 3 ml  Output --  Net 3 ml      11/01/2023    4:00 PM 11/01/2023     3:45 PM 11/01/2023    5:53 AM  Last 3 Weights  Weight (lbs) 175 lb 4.3 oz 175 lb 4.3 oz 175 lb 4.3 oz  Weight (kg) 79.5 kg 79.5 kg 79.5 kg      Telemetry    Normal sinus rhythm- Personally Reviewed  ECG    Sinus tachycardia 100 bpm with anterior infarct age undetermined, PVC, inferior infarct age undetermined- Personally Reviewed  Physical Exam   GEN: Very agitated and combative today Neck: No JVD Cardiac: RRR, no murmurs, rubs, or gallops.  Respiratory: Diffuse expiratory wheezes and rhonchi GI: Soft, nontender, non-distended  MS: No edema; No deformity. Neuro: Cannot adequately assess due to agitation Psych: Very stated  Labs    High Sensitivity Troponin:   Recent Labs  Lab 10/24/23 1220 10/24/23 1743 11/16/23 1044 11/16/23 1651  TROPONINIHS 66* 57* 205* 199*      Chemistry Recent Labs  Lab 11/16/23 1044 11/16/23 1657 11/16/23 2115 11/16/23 2200 11/17/23 0012 11/17/23 0349  NA 136   < > 136  --  137 139  K 3.3*   < > 3.4*  --  5.2* 4.1  CL 93*  --   --   --   --  98  CO2 28  --   --   --   --  26  GLUCOSE 106*  --   --   --   --  117*  BUN 19  --   --   --   --  22  CREATININE 4.79*  --   --   --   --  5.68*  CALCIUM 8.0*  --   --   --   --  7.9*  PROT  --   --   --  7.3  --   --   ALBUMIN  --   --   --  2.9*  --   --   AST  --   --   --  18  --   --   ALT  --   --   --  12  --   --   ALKPHOS  --   --   --  110  --   --   BILITOT  --   --   --  0.7  --   --   GFRNONAA 9*  --   --   --   --  7*  ANIONGAP 15  --   --   --   --  15   < > = values in this interval not displayed.     Hematology Recent Labs  Lab 11/16/23 1044 11/16/23 1657 11/16/23 2115 11/17/23 0012 11/17/23 0349  WBC 9.0  --   --   --  8.0  RBC 3.34*  --   --   --  3.46*  HGB 9.9*   < > 11.2* 9.9* 10.3*  HCT 29.3*   < > 33.0* 29.0* 30.3*  MCV 87.7  --   --   --  87.6  MCH 29.6  --   --   --  29.8  MCHC 33.8  --   --   --  34.0  RDW 19.3*  --   --   --  19.4*  PLT 284   --   --   --  301   < > = values in this interval not displayed.    BNPNo results for input(s): "BNP", "PROBNP" in the last 168 hours.   DDimer No results for input(s): "DDIMER" in the last 168 hours.  Radiology    EEG adult Result Date: 11/17/2023 Charlsie Quest, MD     11/17/2023 10:44 AM Patient Name: Carolyn Russo MRN: [id number] Epilepsy Attending: Charlsie Quest Referring Physician/Provider: Caryl Pina, MD Date: 11/17/2023 Duration: 23.09 mins Patient history: 73 year old female with history of seizures who presented with altered mental status.  EEG to evaluate for seizure. Level of alertness: Awake AEDs during EEG study: LEV Technical aspects: This EEG study was done with scalp electrodes positioned according to the 10-20 International system of electrode placement. Electrical activity was reviewed with band pass filter of 1-70Hz , sensitivity of 7 uV/mm, display speed of 41mm/sec with a 60Hz  notched filter applied as appropriate. EEG data were recorded continuously and digitally stored.  Video monitoring was available and reviewed as appropriate. Description: The posterior dominant rhythm consists of 8 Hz activity of moderate voltage (25-35 uV) seen predominantly in posterior head regions, symmetric and reactive to eye opening and eye closing. EEG showed intermittent generalized 3 to 6 Hz theta-delta slowing.spikes were noted in right temporoparietal region. Hyperventilation and photic stimulation were not performed.   Of note, study was technically difficult due to significant movement artifact. 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  Additionally there is mild diffuse  encephalopathy. No seizures were seen throughout the recording. Charlsie Quest   MR BRAIN WO CONTRAST Result Date: 11/17/2023 CLINICAL DATA:  Initial evaluation  for acute TIA. EXAM: MRI HEAD WITHOUT CONTRAST TECHNIQUE: Multiplanar, multiecho pulse sequences of the brain and surrounding structures were obtained without intravenous contrast. COMPARISON:  Prior CT from earlier the same day as well as prior CTA from 06/22/2022. FINDINGS: Brain: Examination degraded by motion artifact. Diffuse prominence of the CSF containing spaces compatible with atrophy, most pronounced about the frontal lobes bilaterally. Patchy and confluent T2/FLAIR hyperintensity involving the periventricular deep white matter both cerebral hemispheres, consistent with chronic small vessel ischemic disease, moderate to advanced in nature. Patchy involvement of the pons noted. Remote lacunar infarct present at the right basal ganglia. Associated wallerian degeneration noted. Remote lacunar infarct noted within the left cerebellum as well. A 1.3 cm focus of diffusion signal abnormality involving the inferior right cerebellum, consistent with an acute to early subacute right PICA distribution infarct. No associated hemorrhage or significant mass effect. No other evidence for acute or subacute ischemia. No acute intracranial hemorrhage. Few scattered chronic micro hemorrhages noted, likely hypertensive in nature. No mass lesion, midline shift or mass effect. Mild ventricular prominence related to global parenchymal volume loss without hydrocephalus. No extra-axial fluid collection. Pituitary gland suprasellar region within normal limits. Vascular: Loss of normal flow void within the intradural right V4 segment, which could reflect slow flow and/or occlusion. This appears to be new from prior CTA from 06/22/2022. Major intracranial vascular flow voids are otherwise maintained. Skull and upper cervical spine: Craniocervical junction within normal limits. Decreased T1 signal intensity noted within the visualized bone marrow, nonspecific, but most commonly related to anemia, smoking or obesity. No scalp soft  tissue abnormality. Sinuses/Orbits: Bilateral ocular lens replacement with axial myopia. Scattered mucosal thickening noted about the paranasal sinuses. Superimposed air-fluid level within the right maxillary sinus. Small to moderate bilateral mastoid effusions. Negative nasopharynx. Other: None. IMPRESSION: 1. 1.3 cm acute to early subacute right PICA distribution infarct. No associated hemorrhage or significant mass effect. 2. Loss of normal flow void within the intradural right V4 segment, which could reflect slow flow and/or occlusion. This appears to be new from prior CTA from 06/22/2022. 3. Underlying moderate to advanced chronic microvascular ischemic disease with remote lacunar infarcts involving the right basal ganglia and left cerebellum. 4. Underlying atrophy, most pronounced about the frontal lobes bilaterally. 5. Paranasal sinus disease with air-fluid level within the right maxillary sinus. Clinical correlation for possible acute sinusitis recommended. Electronically Signed   By: Rise Mu M.D.   On: 11/17/2023 00:40   DG Chest Portable 1 View Result Date: 11/17/2023 CLINICAL DATA:  Altered mental status EXAM: PORTABLE CHEST 1 VIEW COMPARISON:  11/16/2023 FINDINGS: Right dialysis catheter is unchanged. Cardiomegaly with vascular congestion and mild interstitial and airspace opacities compatible with edema. No effusions or acute bony abnormality. IMPRESSION: Cardiomegaly, vascular congestion.  Mild pulmonary edema. Electronically Signed   By: Charlett Nose M.D.   On: 11/17/2023 00:26   CT Angio Chest PE W and/or Wo Contrast Result Date: 11/16/2023 CLINICAL DATA:  Chest pain, Pulmonary embolism (PE) suspected, high prob EXAM: CT ANGIOGRAPHY CHEST WITH CONTRAST TECHNIQUE: Multidetector CT imaging of the chest was performed using the standard protocol during bolus administration of intravenous contrast. Multiplanar CT image reconstructions and MIPs were obtained to evaluate the vascular  anatomy. RADIATION DOSE REDUCTION: This exam was performed according to the departmental dose-optimization program which includes automated exposure control, adjustment  of the mA and/or kV according to patient size and/or use of iterative reconstruction technique. CONTRAST:  75mL OMNIPAQUE IOHEXOL 350 MG/ML SOLN COMPARISON:  11/16/2023, 10/24/2023 FINDINGS: Cardiovascular: This is a technically adequate evaluation of the pulmonary vasculature. No filling defects or pulmonary emboli. The heart is enlarged without pericardial effusion. No evidence of thoracic aortic aneurysm or dissection. Stable aortic valve calcification, aortic atherosclerosis, and atherosclerosis throughout the coronary vasculature. Right internal jugular dialysis catheter tip within the atriocaval junction. Mediastinum/Nodes: Stable right hilar adenopathy measuring up to 11 mm in short axis. Thyroid, trachea, and esophagus are unremarkable. Small hiatal hernia. Lungs/Pleura: There are dependent areas of consolidation within the lungs bilaterally, favoring hypoventilatory changes over airspace disease. Trace bilateral pleural fluid. No pneumothorax. The central airways are patent. Upper Abdomen: No acute abnormality. Musculoskeletal: No acute or destructive bony abnormalities. Reconstructed images demonstrate no additional findings. Review of the MIP images confirms the above findings. IMPRESSION: 1. No evidence of pulmonary embolus. 2. Dependent areas of consolidation within the lungs bilaterally, favor hypoventilatory changes over airspace disease. 3. Trace bilateral pleural effusions. 4. Aortic Atherosclerosis (ICD10-I70.0). Coronary artery atherosclerosis. Electronically Signed   By: Sharlet Salina M.D.   On: 11/16/2023 19:07   CT HEAD WO CONTRAST ( ) Result Date: 11/16/2023 CLINICAL DATA:  Head trauma, minor (Age >= 65y) EXAM: CT HEAD WITHOUT CONTRAST TECHNIQUE: Contiguous axial images were obtained from the base of the skull through the  vertex without intravenous contrast. RADIATION DOSE REDUCTION: This exam was performed according to the departmental dose-optimization program which includes automated exposure control, adjustment of the mA and/or kV according to patient size and/or use of iterative reconstruction technique. COMPARISON:  CT head 06/22/2022. FINDINGS: Brain: Infarct in the inferior right cerebellum which is new since the 06/12/2022. No evidence of acute hemorrhage, hydrocephalus, extra-axial collection or mass lesion/mass effect. Similar patchy white matter hypodensities, nonspecific but compatible with chronic microvascular ischemic disease. Similar remote perforator infarct in the right basal ganglia. Vascular: Calcific atherosclerosis.  No hyperdense vessel. Skull: No acute fracture. Sinuses/Orbits: Moderate right maxillary sinus mucosal thickening. No acute orbital findings. Other: No mastoid effusions. IMPRESSION: 1. Infarct in the inferior right cerebellum which is new since 06/12/2022 but otherwise age indeterminate. If there is concern for acute infarct, recommend MRI head. 2. No acute hemorrhage. 3. Similar chronic microvascular ischemic changes. Electronically Signed   By: Feliberto Harts M.D.   On: 11/16/2023 19:06   DG Shoulder Left Result Date: 11/16/2023 CLINICAL DATA:  Left arm pain status post fall. EXAM: LEFT SHOULDER - 2+ VIEW COMPARISON:  None Available. FINDINGS: There is no evidence of acute fracture or dislocation. Mild degenerative changes of the glenohumeral and acromioclavicular joints. IMPRESSION: No acute osseous abnormality. Electronically Signed   By: Hart Robinsons M.D.   On: 11/16/2023 14:54   DG Chest 1 View Result Date: 11/16/2023 CLINICAL DATA:  Chest pain. EXAM: CHEST  1 VIEW COMPARISON:  Radiographs 10/24/2023 and 02/12/2023.  CT 10/24/2023. FINDINGS: 1157 hours. Right IJ hemodialysis catheter projects over the lower right atrium. Stable cardiomegaly and vascular congestion. There is  chronic scarring or atelectasis at the left lung base. No edema, confluent airspace disease, significant pleural effusion or pneumothorax. The bones appear unchanged. Telemetry leads overlie the chest. IMPRESSION: Stable cardiomegaly and vascular congestion. No evidence of acute cardiopulmonary process. Electronically Signed   By: Carey Bullocks M.D.   On: 11/16/2023 12:14    Patient Profile     73 y.o. female  with a hx of hypertension,  hyperlipidemia, diabetes, PAD, CVA with left-sided deficit, ESRD on HD, COPD who is being seen 11/16/2023 for the evaluation of chest pain/elevated troponin at the request of Dr. Eloise Harman.   Assessment & Plan    Chest pain Nonobstructive CAD '18 Elevated troponin -- Presented with left-sided chest pain, after a fall out of bed a few days ago --recent admission with similar complaints of left-sided chest pain and low-level troponin of 60-70.  Echocardiogram with LVEF of 55 to 60%, no regional wall motion abnormalities.   --In talking with patient she reports that her pain never really subsided from this previous admission.  She is tender to palpation across the left anterior chest. -- High-sensitivity troponin 205, EKG with nonspecific changes -- Difficult to differentiate whether her chest pain is exertional, pleuritic as she pretty much answers yes to all questions ---Chest CT demonstrated no PE but bilateral dependent consolidation consistent with hypoventilation versus airspace disease and trace bilateral pleural effusions -- Positive for RSV and COVID  -- Suspect that mildly elevated troponin is more demand ischemia in the setting of COVID-19 and RSV positive and not ACS  -- Repeat echo pending to assess for new wall motion abnormalities --EKG essentially unchanged from prior EKG in December    ESRD on HD -- on MWF schedule -- Per nephrology   Hypertension -- BPs have elevated historically, does not have issues with low BPs with HD -- Blood pressure  elevated today but likely exacerbated by her extreme agitation  -- PTA she was on hydralazine 100 mg 3 times daily, losartan 50 mg twice daily -- Will defer BP management to [facility] and nephrology    Tachy-brady syndrome -- recent outpatient Zio with episodes of SVT and pauses.  -- Coreg was stopped with outpatient appt with EP pending  Acute respiratory failure Encephalopathy COVID-19/RSV -Per [facility]  Acute CVA --Yesterday noted to be more confused and weaker than baseline when attempting to get out of bed --Head CT showed an infarct in the right cerebellum which was new since 06/12/2022 --MRI of brain demonstrated acute right inferomedial cerebellar ischemic infarct in the right PICA territory --Had 2 episodes of intermittent somnolence x 2 despite not getting sedation for the scan --Now very agitated --Neurology following --EEG with focal epilepsy arising from right temporoparietal region and diffuse encephalopathy   Per primary Hx of CVA-left sided deficits DM PAD s/p right BKA  I spent 35 minutes caring for this patient today face to face, ordering and reviewing labs, reviewing records from this hospitalization including neurology consult, EEG (, seeing the patient, documenting in the record, and arranging for a 2D echo      For questions or updates, please contact [facility] Please consult [email or web address] for contact info under        Signed, Armanda Magic, MD  11/17/2023,

## 2023-11-17 NOTE — ED Notes (Addendum)
Upon returning from MRI, this nurse went into the patient's room to put her back on the cardiac monitor. Upon assessing patient, patient was lethargic and not responding to voice or movement. Patient's heart rate spiked to 128. This nurse called for help, team of nurses responded with Jarold Motto MD and [place] PA. Patient became responsive to painful stimuli. Patient's heart is now in the 80s with SpO2 at 100%. Attending Nolberto Hanlon, MD made aware.

## 2023-11-17 NOTE — Progress Notes (Signed)
[facility] night cross cover note:  In the setting of the patient's somnolence, will convert her oral Keppra to equivalent IV dosing/frequency. Of note, in this context, patient missed her evening Keppra dose, which was scheduled for 2200 on 11/16/23.   Newton Pigg, DO Hospitalist

## 2023-11-17 NOTE — ED Notes (Signed)
Messaged attending Maryjean Ka, MD regarding changing oral keppra to IV keppra, due to patient's inability to stay awake long to take the oral medication.

## 2023-11-17 NOTE — ED Notes (Signed)
Patient's EKG heart rate dropped to the 30s when this nurse was in the room. Two different sets of EKG leads were used. Maryjean Ka, MD attending was notified. No new orders were placed. Third set of EKG leads were used and patient's heart rate is staying at 91. Maryjean Ka, MD notified.

## 2023-11-17 NOTE — Progress Notes (Signed)
EEG complete - results pending.  Gmd/nf

## 2023-11-17 NOTE — Progress Notes (Signed)
  Echocardiogram 2D Echocardiogram has been performed.  Delcie Roch 11/17/2023, 6:01 PM

## 2023-11-17 NOTE — Progress Notes (Signed)
Upon arrival to the unit pt stated she had a bowel movement. When assessed it was noted that the pt was very impacted. MD was paged.

## 2023-11-17 NOTE — Progress Notes (Signed)
[facility] night cross cover note:   I was notified by RN that the patient is experiencing some additional chest discomfort at this time.  Vital signs remain stable, including afebrile, heart rates in the 80s to 90s, most recent systolic blood pressures in the 130s.  Stat EKG obtained.  No pain medications currently ordered.  I subsequently placed order for prn sublingual nitroglycerin.    Newton Pigg, DO Hospitalist

## 2023-11-17 NOTE — Progress Notes (Signed)
Mri finding of acute stroke noted. No LVO finding. Time of onset not clear. Will request neuro input from Dr. Otelia Limes

## 2023-11-17 NOTE — ED Notes (Signed)
Patient started complaining of 10 out of 10 chest pain that was described as aching and sharp. Attending Maryjean Ka MD made aware.

## 2023-11-17 NOTE — Consult Note (Signed)
Renal Service Consult Note [facility]  Carolyn Russo 11/17/2023 Carolyn Krabbe, MD Requesting Physician: Dr. Lucianne Russo  Reason for Consult: ESRD pt w/ SOB and CAP HPI: The patient is a 73 y.o. year-old w/ PMH as below who presented to ED c/o SOB, cough and chest pains. No fevers.  In ED pt hypoxic and required 2-3 L [place] O2. CXR showed vasc congestion and possible early edema. CTA showed no PE, + diffuse GG changes c/w edema. CT head (MS changes) and MRI showed cerebellar CVA which was acute to subacute. H/o ESRD on HD mwf last HD was 1/22, also hx of CHF, h/o CVA, DM2, R BkA and seizure d/o on keppra, HTN. Lab showed K 4.1, bun 22, creat 5, hb 10, wbc 8k.  Pt admitted for CAP and given IV abx. Nephrology asked to see for dialysis.   Pt seen in ED bed.  Hx of recent cough, chest pain and SOB. No f/c/s. Lives w/ husband at home, husband drives her to dialysis. Has not missed HD.   ROS - no joint pain, no HA, no blurry vision, no rash, no diarrhea, no nausea/ vomiting   Past Medical History  Past Medical History:  Diagnosis Date   Acute GI bleeding    Allergy    Anemia    Anterior chest wall pain    Appendicitis 1965   Asthma    Body mass index 37.0-37.9, adult    Breast pain    Cataract    both eyes   CHF (congestive heart failure) (HCC)    Chronic kidney disease    stage 5 - on dialysis   Cognitive change 04/20/2021   r/t cva 03/2821   Complication of anesthesia    Memory loss after general   Dehydration 2014   Deviated septum 1971   Diabetes mellitus    Dysphagia due to old stroke    easy to get strangled when eating   Dyspnea 2014   Extrinsic asthma    WITH ASTHMA ATTACK   Fibroid 1980   GERD (gastroesophageal reflux disease)    Heart murmur    History of migraine    History of seizure    with stroke   Hx gestational diabetes    Hyperlipidemia    Hypertension 2014   Inguinal hernia 1959   Malaise and fatigue 2014   Non-IgE mediated allergic asthma  2014   Obesity    Pelvic pain    Pregnancy, high-risk 1985   Stroke (HCC) 04/20/2021   (CVA) of right basal ganglia   Tonsillitis 1968   Uterine fibroid 1980   Visual field defect    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   Past Surgical History  Past Surgical History:  Procedure Laterality Date   ABDOMINAL AORTOGRAM W/LOWER EXTREMITY N/A 02/21/2022   Procedure: ABDOMINAL AORTOGRAM W/LOWER EXTREMITY;  Surgeon: Maeola Harman, MD;  Location: [facility] INVASIVE CV LAB;  Service: Cardiovascular;  Laterality: N/A;   AMPUTATION Right 05/18/2022   Procedure: RIGHT BELOW KNEE AMPUTATION;  Surgeon: Nadara Mustard, MD;  Location: [facility] OR;  Service: Orthopedics;  Laterality: Right;   AMPUTATION TOE Right 04/15/2022   Procedure: AMPUTATION  LST TOERIGHT FOOT;  Surgeon: Edwin Cap, DPM;  Location: [facility] ORS;  Service: Podiatry;  Laterality: Right;   APPENDECTOMY  1959   BASCILIC VEIN TRANSPOSITION Right 04/30/2021   Procedure: RIGHT FIRST STAGE BASCILIC VEIN TRANSPOSITION;  Surgeon: Chuck Hint, MD;  Location[facility] OR;  Service: Vascular;  Laterality:  Right;   CESAREAN SECTION  1985   COLONOSCOPY     ESOPHAGOGASTRODUODENOSCOPY (EGD) WITH PROPOFOL N/A 04/18/2021   Procedure: ESOPHAGOGASTRODUODENOSCOPY (EGD) WITH PROPOFOL;  Surgeon: Iva Boop, MD;  Location: [facility] ENDOSCOPY;  Service: Endoscopy;  Laterality: N/A;   EYE SURGERY     bilateral cataract    FLEXIBLE SIGMOIDOSCOPY N/A 04/18/2021   Procedure: FLEXIBLE SIGMOIDOSCOPY;  Surgeon: Iva Boop, MD;  Location: [facility] ENDOSCOPY;  Service: Endoscopy;  Laterality: N/A;   HERNIA REPAIR  1959   IR FLUORO GUIDE CV LINE RIGHT  03/18/2021   IR US GUIDE VASC ACCESS RIGHT  03/18/2021   LEFT HEART CATH AND CORONARY ANGIOGRAPHY N/A 04/04/2017   Procedure: Left Heart Cath and Coronary Angiography;  Surgeon: Lyn Records, MD;  Location: [facility] INVASIVE CV LAB;  Service: Cardiovascular;  Laterality: N/A;   MYOMECTOMY  1980, 2004, 2007   PERIPHERAL VASCULAR  THROMBECTOMY  02/21/2022   Procedure: PERIPHERAL VASCULAR THROMBECTOMY;  Surgeon: Maeola Harman, MD;  Location: [facility] INVASIVE CV LAB;  Service: Cardiovascular;;  Right Tibial   RHINOPLASTY  1971   ROTATOR CUFF REPAIR  2003   SURGICAL REPAIR OF HEMORRHAGE  2015   TONSILLECTOMY  1968   Family History  Family History  Problem Relation Age of Onset   Psoriasis Mother    Melanoma Mother        abdominal   Alzheimer's disease Father    Diabetes Maternal Aunt    Colon cancer Cousin    Diabetes Cousin    Colon polyps Neg Hx    Esophageal cancer Neg Hx    Cancer - Colon Neg Hx    Liver disease Neg Hx    BRCA 1/2 Neg Hx    Breast cancer Neg Hx    Social History  reports that she has never smoked. She has never used smokeless tobacco. She reports that she does not drink alcohol and does not use drugs. Allergies  Allergies  Allergen Reactions   Almond Oil Anaphylaxis   Food Anaphylaxis    Peanuts - anaphylaxis Almonds - anaphylaxis Not listed on MAR   Wheat Other (See Comments)    Upset stomach   Not listed on MAR  Other Reaction(s): abdominal pain   Statins Itching and Other (See Comments)    Generalized aches- tolerates crestor  Not listed on MAR   Gadolinium Derivatives Other (See Comments)    Gadolinium-Containing Contrast Media Listed as an allergy on MAR Unknown reaction   Januvia [Sitagliptin] Other (See Comments)    Listed as an allergy on MAR Unknown reaction   Pork-Derived Products Other (See Comments)    Does not eat pork  Listed on MAR   Shellfish Allergy Other (See Comments)    Mouth gets raw Listed on MAR   Tetracycline Other (See Comments)    Raw mouth  Not listed on MAR  Other Reaction(s): Not available   Tetracycline Hcl     Other reaction(s): "raw mouth"  Other Reaction(s): "raw mouth"   Home medications Prior to Admission medications   Medication Sig Start Date End Date Taking? Authorizing Provider  acetaminophen (TYLENOL) 500 MG  tablet Take 1,000 mg by mouth 2 (two) times daily as needed (leg and arm pain).    [provider]  albuterol (PROVENTIL) (2.5 MG/3ML) 0.083% nebulizer solution Use 1 vial (2.5 mg total) by nebulization every 4 (four) hours as needed for wheezing or shortness of breath. 02/14/23 02/14/24  Joseph Art, DO  amitriptyline (ELAVIL)  25 MG tablet Take 0.5-1 tablets (12.5-25 mg total) by mouth at bedtime. 09/11/23   Lovorn, Aundra Millet, MD  aspirin EC 81 MG tablet Take 1 tablet (81 mg total) by mouth daily. Swallow whole. 06/10/22   Willeen Niece, MD  calcitRIOL (ROCALTROL) 0.5 MCG capsule Take 0.5 mcg by mouth See admin instructions. Only takes on Dialysis days 07/10/23   [provider]  cinacalcet (SENSIPAR) 30 MG tablet Take 30 mg by mouth daily.    [provider]  cyclobenzaprine (FLEXERIL) 5 MG tablet Take 1 tablet (5 mg total) by mouth 3 (three) times daily as needed for muscle spasms. 11/01/23   Hughie Closs, MD  diclofenac Sodium (VOLTAREN) 1 % GEL Apply 2 g topically 4 (four) times daily. To left wrist 05/26/21   Love, Evlyn Kanner, PA-C  gabapentin (NEURONTIN) 300 MG capsule Take 1 capsule (300 mg total) by mouth at bedtime. 09/11/23   Lovorn, Aundra Millet, MD  Glucagon, rDNA, (GLUCAGON EMERGENCY) 1 MG KIT Inject 1 mg into the vein as needed (CBG of 65mg /dL).    [provider]  hydrALAZINE (APRESOLINE) 100 MG tablet Take 1 tablet (100 mg total) by mouth 3 (three) times daily. 11/01/23 12/01/23  Hughie Closs, MD  hydrocortisone (ANUSOL-HC) 2.5 % rectal cream Place rectally 4 (four) times daily. 02/14/23   Joseph Art, DO  insulin aspart (NOVOLOG) 100 UNIT/ML injection Inject 0-6 Units into the skin 3 (three) times daily with meals. CBG < 70: Implement Hypoglycemia Standing Orders and refer to Hypoglycemia Standing Orders sidebar report CBG 70 - 120: 0 units CBG 121 - 150: 0 units CBG 151 - 200: 1 unit CBG 201-250: 2 units CBG 251-300: 3 units CBG 301-350: 4 units CBG  351-400: 5 units CBG > 400: Give 6 units and call MD 06/29/22   Joycelyn Das, MD  Iron Sucrose (VENOFER IV) Inject 50 mg into the skin once a week. 08/16/23 10/18/24  [provider]  levETIRAcetam (KEPPRA) 250 MG tablet Take 1.5 tablets (375 mg total) by mouth every Monday, Wednesday, and Friday at 6 PM. 06/29/22   Pokhrel, Rebekah Chesterfield, MD  levETIRAcetam (KEPPRA) 750 MG tablet Take 1 tablet (750 mg total) by mouth at bedtime. 06/29/22   Pokhrel, Rebekah Chesterfield, MD  loratadine (CLARITIN) 10 MG tablet Take 10 mg by mouth daily.    [provider]  losartan (COZAAR) 50 MG tablet Take 1 tablet (50 mg total) by mouth 2 (two) times daily. 11/01/23 12/01/23  Hughie Closs, MD  Methoxy PEG-Epoetin Beta (MIRCERA IJ) Inject 1 Dose into the skin every 14 (fourteen) days. 04/19/23 08/28/24  [provider]  methylphenidate (RITALIN) 5 MG tablet Take 1 tablet (5 mg total) by mouth 2 (two) times daily with breakfast and lunch. 09/11/23   Lovorn, Aundra Millet, MD  mometasone-formoterol (DULERA) 200-5 MCG/ACT AERO Inhale 2 puffs into the lungs 2 (two) times daily. 04/15/22   Dorothyann Peng, MD  pantoprazole (PROTONIX) 40 MG tablet Take 1 tablet (40 mg total) by mouth daily. Patient taking differently: Take 40 mg by mouth at bedtime. 02/07/22   Dorothyann Peng, MD  prochlorperazine (COMPAZINE) 10 MG tablet Take 1 tablet (10 mg total) by mouth every 8 (eight) hours as needed for nausea or vomiting. 02/15/23   Sherrilyn Rist, MD  rosuvastatin (CRESTOR) 20 MG tablet Take 1 tablet (20 mg total) by mouth daily. Patient taking differently: Take 20 mg by mouth at bedtime. 08/18/21   Charlesetta Ivory, NP  sevelamer carbonate (RENVELA) 0.8 g PACK packet  Take 0.8 g by mouth 3 (three) times daily. 10/19/21   [provider]  ticagrelor (BRILINTA) 90 MG TABS tablet Take 1 tablet (90 mg total) by mouth 2 (two) times daily. 06/10/22   Willeen Niece, MD     Vitals:   11/17/23 0515 11/17/23 0545 11/17/23 0700  11/17/23 0856  BP: 135/82 125/70  111/78  Pulse: 95 93  95  Resp: 19 19  18   Temp:   98.3 F (36.8 C) 97.8 F (36.6 C)  TempSrc:   Oral Oral  SpO2: 100% 100%  100%   Exam Gen alert, no distress, frail elderly AAF No rash, cyanosis gangrene No jvd or bruits Chest occ crackles bilat bases, bilat diffuse mild exp wheezing  RRR no RG Abd soft ntnd no mass or ascites +bs GU defer MS no joint effusions or deformity Ext LE or UE edema, no other edema Neuro is alert, Ox 3 , nf    R fem TDC intact      Renal-related home meds: - sensipar 30 every day - hydralazine 100 tid - losartan 50 bid - renvela 800 ac tid - others: brilinta, keppra, PPI, statin, dulera, insulin, elavil, asa    OP HD: MWF NW  4h   B350    75.5kg   2/2 bath   R fem TDC  Heparin 2600 + 1400 midrun - last HD 1/22, getting to dry wt - 3-4 drops in dry wt in last 3 wks - venofer 100 qhd thru 1/31 - mircera 150 q2, last 1/17, due 1/31   Assessment/ Plan: AHRF/ CAP - started on IV abx and supp O2. Imaging suggest vol overload as well. Plan HD later today/ this evening w/ max UF.  AMS - pt was lethargic, possible new CVA by imaging. Seen by neurology - altered MS could be related to hypoxia, TME, post-ictal.  ESRD - on HD MWF. HD today as above.  HTN - pt BP's are low-normal to normal. Would hold home BP meds for now as we will be attempting large volume UF w/ HD.   Volume - prob pulm edema by imaging.  Anemia of esrd - Hb 10 here. Esa due on 1/31. Follow.  Secondary hyperparathyroidism - CCa in range, add on phos. Cont binders w/ meals.  H/o prior CVA H/o seizure d/o - on Keppra      Rob Lorene Samaan  MD [facility] 11/17/2023, 11:05 AM  Recent Labs  Lab 11/16/23 1044 11/16/23 1657 11/16/23 2200 11/17/23 0012 11/17/23 0349  HGB 9.9*   < >  --  9.9* 10.3*  ALBUMIN  --   --  2.9*  --   --   CALCIUM 8.0*  --   --   --  7.9*  CREATININE 4.79*  --   --   --  5.68*  K 3.3*   < >  --  5.2* 4.1   < > = values in  this interval not displayed.   Inpatient medications:  [START ON 11/18/2023]  stroke: early stages of recovery book   Does not apply Once   aspirin  300 mg Rectal Daily   Or   aspirin  325 mg Oral Daily   fluticasone furoate-vilanterol  1 puff Inhalation Daily   heparin injection (subcutaneous)  5,000 Units Subcutaneous Q8H   insulin aspart  0-5 Units Subcutaneous QHS   insulin aspart  0-6 Units Subcutaneous TID WC   ipratropium-albuterol  3 mL Nebulization Q4H   methylPREDNISolone (SOLU-MEDROL) injection  60  mg Intravenous Q12H   pantoprazole (PROTONIX) IV  40 mg Intravenous Q24H   sodium chloride flush  3 mL Intravenous Q12H   ticagrelor  90 mg Oral BID    azithromycin Stopped (11/17/23 0154)   cefTRIAXone (ROCEPHIN)  IV Stopped (11/17/23 0020)   levETIRAcetam     levETIRAcetam     acetaminophen **OR** acetaminophen, hydrALAZINE, labetalol, nitroGLYCERIN, polyethylene glycol

## 2023-11-17 NOTE — Plan of Care (Signed)
  Problem: Education: Goal: Ability to describe self-care measures that may prevent or decrease complications (Diabetes Survival Skills Education) will improve Outcome: Progressing Goal: Individualized Educational Video(s) Outcome: Progressing   Problem: Coping: Goal: Ability to adjust to condition or change in health will improve Outcome: Progressing   Problem: Fluid Volume: Goal: Ability to maintain a balanced intake and output will improve Outcome: Progressing   Problem: Health Behavior/Discharge Planning: Goal: Ability to identify and utilize available resources and services will improve Outcome: Progressing Goal: Ability to manage health-related needs will improve Outcome: Progressing   Problem: Metabolic: Goal: Ability to maintain appropriate glucose levels will improve Outcome: Progressing   Problem: Nutritional: Goal: Maintenance of adequate nutrition will improve Outcome: Progressing Goal: Progress toward achieving an optimal weight will improve Outcome: Progressing   Problem: Skin Integrity: Goal: Risk for impaired skin integrity will decrease Outcome: Progressing   Problem: Tissue Perfusion: Goal: Adequacy of tissue perfusion will improve Outcome: Progressing   Problem: Education: Goal: Knowledge of disease or condition will improve Outcome: Progressing Goal: Knowledge of secondary prevention will improve (MUST DOCUMENT ALL) Outcome: Progressing Goal: Knowledge of patient specific risk factors will improve (DELETE if not current risk factor) Outcome: Progressing   Problem: Ischemic Stroke/TIA Tissue Perfusion: Goal: Complications of ischemic stroke/TIA will be minimized Outcome: Progressing   Problem: Coping: Goal: Will verbalize positive feelings about self Outcome: Progressing Goal: Will identify appropriate support needs Outcome: Progressing   Problem: Health Behavior/Discharge Planning: Goal: Ability to manage health-related needs will  improve Outcome: Progressing Goal: Goals will be collaboratively established with patient/family Outcome: Progressing   Problem: Self-Care: Goal: Ability to participate in self-care as condition permits will improve Outcome: Progressing Goal: Verbalization of feelings and concerns over difficulty with self-care will improve Outcome: Progressing Goal: Ability to communicate needs accurately will improve Outcome: Progressing   Problem: Nutrition: Goal: Risk of aspiration will decrease Outcome: Progressing Goal: Dietary intake will improve Outcome: Progressing   Problem: Education: Goal: Knowledge of risk factors and measures for prevention of condition will improve Outcome: Progressing   Problem: Coping: Goal: Psychosocial and spiritual needs will be supported Outcome: Progressing   Problem: Respiratory: Goal: Will maintain a patent airway Outcome: Progressing Goal: Complications related to the disease process, condition or treatment will be avoided or minimized Outcome: Progressing   Problem: Education: Goal: Knowledge of General Education information will improve Description: Including pain rating scale, medication(s)/side effects and non-pharmacologic comfort measures Outcome: Progressing   Problem: Health Behavior/Discharge Planning: Goal: Ability to manage health-related needs will improve Outcome: Progressing   Problem: Clinical Measurements: Goal: Ability to maintain clinical measurements within normal limits will improve Outcome: Progressing Goal: Will remain free from infection Outcome: Progressing Goal: Diagnostic test results will improve Outcome: Progressing Goal: Respiratory complications will improve Outcome: Progressing Goal: Cardiovascular complication will be avoided Outcome: Progressing   Problem: Activity: Goal: Risk for activity intolerance will decrease Outcome: Progressing   Problem: Nutrition: Goal: Adequate nutrition will be  maintained Outcome: Progressing   Problem: Coping: Goal: Level of anxiety will decrease Outcome: Progressing   Problem: Elimination: Goal: Will not experience complications related to bowel motility Outcome: Progressing Goal: Will not experience complications related to urinary retention Outcome: Progressing   Problem: Pain Managment: Goal: General experience of comfort will improve and/or be controlled Outcome: Progressing   Problem: Safety: Goal: Ability to remain free from injury will improve Outcome: Progressing   Problem: Skin Integrity: Goal: Risk for impaired skin integrity will decrease Outcome: Progressing

## 2023-11-17 NOTE — Progress Notes (Addendum)
STROKE TEAM PROGRESS NOTE   BRIEF HPI [name] is a 73 y.o. female who presented from her [facility]F for chest pain, confusion and weakness.  CT head revealed infarct in the inferior right cerebellum which was new since 06/12/2022, MRI brain with acute right inferomedial cerebellar ischemic infarction in the right PICA territory.   SIGNIFICANT HOSPITAL EVENTS 1/24 CT head revealed infarct in the inferior right cerebellum which was new since 06/12/2022, MRI brain with acute right inferomedial cerebellar ischemic infarction in the right PICA territory.  In the ED she was found to have COVID/RSV  INTERIM HISTORY/SUBJECTIVE No family at the bedside.  Patient is laying in the bed.  She has a pretty bad cough.  She has been diagnosed with COVID-19/RSV.  She is lethargic, needs constant stimulation alert and oriented to self, age and city.  Place she stated [facility].  She appears to have a right hemianopia she follows commands she does have some dysarthria and some left arm weakness which is old   OBJECTIVE  CBC    Component Value Date/Time   WBC 8.0 11/17/2023 0349   RBC 3.46 (L) 11/17/2023 0349   HGB 10.3 (L) 11/17/2023 0349   HGB 9.3 (L) 04/13/2022 1512   HCT 30.3 (L) 11/17/2023 0349   HCT 27.6 (L) 04/13/2022 1512   PLT 301 11/17/2023 0349   PLT 341 04/13/2022 1512   MCV 87.6 11/17/2023 0349   MCV 89 04/13/2022 1512   MCH 29.8 11/17/2023 0349   MCHC 34.0 11/17/2023 0349   RDW 19.4 (H) 11/17/2023 0349   RDW 16.1 (H) 04/13/2022 1512   LYMPHSABS 3.4 06/08/2022 0340   LYMPHSABS 2.4 04/13/2022 1512   MONOABS 1.8 (H) 06/08/2022 0340   EOSABS 0.4 06/08/2022 0340   EOSABS 0.4 04/13/2022 1512   BASOSABS 0.1 06/08/2022 0340   BASOSABS 0.1 04/13/2022 1512    BMET    Component Value Date/Time   NA 139 11/17/2023 0349   NA 142 04/13/2022 1512   K 4.1 11/17/2023 0349   CL 98 11/17/2023 0349   CO2 26 11/17/2023 0349   GLUCOSE 117 (H) 11/17/2023 0349   BUN 22 11/17/2023 0349    BUN 12 04/13/2022 1512   CREATININE 5.68 (H) 11/17/2023 0349   CREATININE 0.84 10/13/2011 0903   CALCIUM 7.9 (L) 11/17/2023 0349   EGFR 12 (L) 04/13/2022 1512   GFRNONAA 7 (L) 11/17/2023 0349   GFRNONAA 75 10/13/2011 0903    IMAGING past 24 hours EEG adult Result Date: 11/17/2023 Charlsie Quest, MD     11/17/2023 10:44 AM Patient Name: Carolyn Russo MRN: [id number] Epilepsy Attending: Charlsie Quest Referring Physician/Provider: Caryl Pina, MD Date: 11/17/2023 Duration: 23.09 mins Patient history: 73 year old female with history of seizures who presented with altered mental status.  EEG to evaluate for seizure. Level of alertness: Awake AEDs during EEG study: LEV Technical aspects: This EEG study was done with scalp electrodes positioned according to the 10-20 International system of electrode placement. Electrical activity was reviewed with band pass filter of 1-70Hz , sensitivity of 7 uV/mm, display speed of 20mm/sec with a 60Hz  notched filter applied as appropriate. EEG data were recorded continuously and digitally stored.  Video monitoring was available and reviewed as appropriate. Description: The posterior dominant rhythm consists of 8 Hz activity of moderate voltage (25-35 uV) seen predominantly in posterior head regions, symmetric and reactive to eye opening and eye closing. EEG showed intermittent generalized 3 to 6 Hz theta-delta slowing.spikes were noted in right  temporoparietal region. Hyperventilation and photic stimulation were not performed.   Of note, study was technically difficult due to significant movement artifact. 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  Additionally there is mild diffuse encephalopathy. No seizures were seen throughout the recording. Charlsie Quest   MR BRAIN WO CONTRAST Result Date:  11/17/2023 CLINICAL DATA:  Initial evaluation for acute TIA. EXAM: MRI HEAD WITHOUT CONTRAST TECHNIQUE: Multiplanar, multiecho pulse sequences of the brain and surrounding structures were obtained without intravenous contrast. COMPARISON:  Prior CT from earlier the same day as well as prior CTA from 06/22/2022. FINDINGS: Brain: Examination degraded by motion artifact. Diffuse prominence of the CSF containing spaces compatible with atrophy, most pronounced about the frontal lobes bilaterally. Patchy and confluent T2/FLAIR hyperintensity involving the periventricular deep white matter both cerebral hemispheres, consistent with chronic small vessel ischemic disease, moderate to advanced in nature. Patchy involvement of the pons noted. Remote lacunar infarct present at the right basal ganglia. Associated wallerian degeneration noted. Remote lacunar infarct noted within the left cerebellum as well. A 1.3 cm focus of diffusion signal abnormality involving the inferior right cerebellum, consistent with an acute to early subacute right PICA distribution infarct. No associated hemorrhage or significant mass effect. No other evidence for acute or subacute ischemia. No acute intracranial hemorrhage. Few scattered chronic micro hemorrhages noted, likely hypertensive in nature. No mass lesion, midline shift or mass effect. Mild ventricular prominence related to global parenchymal volume loss without hydrocephalus. No extra-axial fluid collection. Pituitary gland suprasellar region within normal limits. Vascular: Loss of normal flow void within the intradural right V4 segment, which could reflect slow flow and/or occlusion. This appears to be new from prior CTA from 06/22/2022. Major intracranial vascular flow voids are otherwise maintained. Skull and upper cervical spine: Craniocervical junction within normal limits. Decreased T1 signal intensity noted within the visualized bone marrow, nonspecific, but most commonly related to  anemia, smoking or obesity. No scalp soft tissue abnormality. Sinuses/Orbits: Bilateral ocular lens replacement with axial myopia. Scattered mucosal thickening noted about the paranasal sinuses. Superimposed air-fluid level within the right maxillary sinus. Small to moderate bilateral mastoid effusions. Negative nasopharynx. Other: None. IMPRESSION: 1. 1.3 cm acute to early subacute right PICA distribution infarct. No associated hemorrhage or significant mass effect. 2. Loss of normal flow void within the intradural right V4 segment, which could reflect slow flow and/or occlusion. This appears to be new from prior CTA from 06/22/2022. 3. Underlying moderate to advanced chronic microvascular ischemic disease with remote lacunar infarcts involving the right basal ganglia and left cerebellum. 4. Underlying atrophy, most pronounced about the frontal lobes bilaterally. 5. Paranasal sinus disease with air-fluid level within the right maxillary sinus. Clinical correlation for possible acute sinusitis recommended. Electronically Signed   By: Rise Mu M.D.   On: 11/17/2023 00:40   DG Chest Portable 1 View Result Date: 11/17/2023 CLINICAL DATA:  Altered mental status EXAM: PORTABLE CHEST 1 VIEW COMPARISON:  11/16/2023 FINDINGS: Right dialysis catheter is unchanged. Cardiomegaly with vascular congestion and mild interstitial and airspace opacities compatible with edema. No effusions or acute bony abnormality. IMPRESSION: Cardiomegaly, vascular congestion.  Mild pulmonary edema. Electronically Signed   By: Charlett Nose M.D.   On: 11/17/2023 00:26   CT Angio Chest PE W and/or Wo Contrast Result Date: 11/16/2023 CLINICAL DATA:  Chest pain, Pulmonary embolism (PE) suspected, high prob EXAM: CT ANGIOGRAPHY CHEST WITH CONTRAST  TECHNIQUE: Multidetector CT imaging of the chest was performed using the standard protocol during bolus administration of intravenous contrast. Multiplanar CT image reconstructions and MIPs  were obtained to evaluate the vascular anatomy. RADIATION DOSE REDUCTION: This exam was performed according to the departmental dose-optimization program which includes automated exposure control, adjustment of the mA and/or kV according to patient size and/or use of iterative reconstruction technique. CONTRAST:  75mL OMNIPAQUE IOHEXOL 350 MG/ML SOLN COMPARISON:  11/16/2023, 10/24/2023 FINDINGS: Cardiovascular: This is a technically adequate evaluation of the pulmonary vasculature. No filling defects or pulmonary emboli. The heart is enlarged without pericardial effusion. No evidence of thoracic aortic aneurysm or dissection. Stable aortic valve calcification, aortic atherosclerosis, and atherosclerosis throughout the coronary vasculature. Right internal jugular dialysis catheter tip within the atriocaval junction. Mediastinum/Nodes: Stable right hilar adenopathy measuring up to 11 mm in short axis. Thyroid, trachea, and esophagus are unremarkable. Small hiatal hernia. Lungs/Pleura: There are dependent areas of consolidation within the lungs bilaterally, favoring hypoventilatory changes over airspace disease. Trace bilateral pleural fluid. No pneumothorax. The central airways are patent. Upper Abdomen: No acute abnormality. Musculoskeletal: No acute or destructive bony abnormalities. Reconstructed images demonstrate no additional findings. Review of the MIP images confirms the above findings. IMPRESSION: 1. No evidence of pulmonary embolus. 2. Dependent areas of consolidation within the lungs bilaterally, favor hypoventilatory changes over airspace disease. 3. Trace bilateral pleural effusions. 4. Aortic Atherosclerosis (ICD10-I70.0). Coronary artery atherosclerosis. Electronically Signed   By: Sharlet Salina M.D.   On: 11/16/2023 19:07   CT HEAD WO CONTRAST ( ) Result Date: 11/16/2023 CLINICAL DATA:  Head trauma, minor (Age >= 65y) EXAM: CT HEAD WITHOUT CONTRAST TECHNIQUE: Contiguous axial images were obtained  from the base of the skull through the vertex without intravenous contrast. RADIATION DOSE REDUCTION: This exam was performed according to the departmental dose-optimization program which includes automated exposure control, adjustment of the mA and/or kV according to patient size and/or use of iterative reconstruction technique. COMPARISON:  CT head 06/22/2022. FINDINGS: Brain: Infarct in the inferior right cerebellum which is new since the 06/12/2022. No evidence of acute hemorrhage, hydrocephalus, extra-axial collection or mass lesion/mass effect. Similar patchy white matter hypodensities, nonspecific but compatible with chronic microvascular ischemic disease. Similar remote perforator infarct in the right basal ganglia. Vascular: Calcific atherosclerosis.  No hyperdense vessel. Skull: No acute fracture. Sinuses/Orbits: Moderate right maxillary sinus mucosal thickening. No acute orbital findings. Other: No mastoid effusions. IMPRESSION: 1. Infarct in the inferior right cerebellum which is new since 06/12/2022 but otherwise age indeterminate. If there is concern for acute infarct, recommend MRI head. 2. No acute hemorrhage. 3. Similar chronic microvascular ischemic changes. Electronically Signed   By: Feliberto Harts M.D.   On: 11/16/2023 19:06    Vitals:   11/17/23 0545 11/17/23 0700 11/17/23 0856 11/17/23 1121  BP: 125/70  111/78 (!) 154/88  Pulse: 93  95 89  Resp: 19  18 (!) 26  Temp:  98.3 F (36.8 C) 97.8 F (36.6 C) 97.9 F (36.6 C)  TempSrc:  Oral Oral Oral  SpO2: 100%  100% 92%     PHYSICAL EXAM General: critically ill elderly African-American lady in no acute distress Psych:  Mood and affect appropriate for situation CV: Regular rate and rhythm on monitor Respiratory:  Regular, unlabored respirations on room air GI: Abdomen soft and nontender   NEURO:  Mental Status: lethargic, needs constant stimulation alert and oriented to self, age and city.  Place she stated [facility].   Speech/Language: speech is with  mild dysarthria and  aphasia.   Cranial Nerves:  II: PERRL. Visual fields full.  III, IV, VI: EOMI. Eyelids elevate symmetrically.  V: Sensation is intact to light touch and symmetrical to face.  VII: Face is symmetrical resting and smiling VIII: hearing intact to voice. IX, X: Palate elevates symmetrically. Phonation is normal.  [id number] shrug 5/5. XII: tongue is midline without fasciculations. Motor: 5/5 strength in right upper and arm. Left upper and lower 4/5 Tone: Increased in left upper extremity with nonfixed flexion contractures of the left hand fingers Sensation- Intact to light touch bilaterally. Extinction absent to light touch to DSS.   Coordination: FTN intact bilaterally, HKS: no ataxia in BLE.No drift.  Gait- deferred   ASSESSMENT/PLAN  Acute Ischemic Infarct:  right PCA  Etiology:  likely embolic   CT head Infarct in the inferior right cerebellum   MRI  1.3 cm acute to early subacute right PICA distribution infarct  MRA slightly hypoplastic posterior circulation with mild narrowing of the terminal left vertebral and distal basilar.   Carotid Doppler  ordered 2D Echo ordered  Likely will need 30 day heart monitor  LDL 37 HgbA1c 5.8 VTE prophylaxis - SCD aspirin 81 mg daily and Brilinta (ticagrelor) 90 mg bid prior to admission, now on aspirin 81 mg daily and Brilinta (ticagrelor) 90 mg bid  Therapy recommendations:  Pending Disposition:  pending  Hx of Stroke/TIA Stroke history  Migraine Hx of right BG in 2022 On keppra 750 mg at bedtime for focal epilepsy. 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  Additionally there is mild diffuse encephalopathy. No seizures were seen throughout the recording.    Hypertension CHF Home meds:  hydralazine 100mg , losartan 50mg ,  Stable Blood Pressure Goal: BP less than 220/110   Hyperlipidemia Home meds:  crestor  20mg ,  resumed in hospital LDL 37, goal < 70 Continue statin at discharge  Diabetes type II Controlled Home meds:  insulin HgbA1c 5.8, goal < 7.0 CBGs SSI Recommend close follow-up with PCP for better DM control  Dysphagia Patient has post-stroke dysphagia, SLP consulted    Diet   Diet NPO time specified   Advance diet as tolerated  Other Stroke Risk Factors Obesity, There is no height or weight on file to calculate BMI., BMI >/= 30 associated with increased stroke risk, recommend weight loss, diet and exercise as appropriate  Congestive heart failure  Other Active Problems ESRD on HD  COVID/ RSV  Hospital day # 1  Gevena Mart DNP, ACNPC-AG  [facility]  I have personally obtained history,examined this patient, reviewed notes, independently viewed imaging studies, participated in medical decision making and plan of care.ROS completed by me personally and pertinent positives fully documented  I have made any additions or clarifications directly to the above note. Agree with note above.  Patient presented with active COVID and RSV infection with altered mental status.  MRI scan shows right PICA branch infarct likely of embolic etiology.  Recommend check echocardiogram and cardiac monitoring.  At discharge recommend 30-day heart monitor to look for for paroxysmal A-fib.  EEG showed spikes in the right hemisphere but these appear to be chronic as seen in the previous EEG from August 23.  Continue Keppra at the current dose. Discussed with Dr. Lucianne Muss.  Greater than 50% time during this 50-minute visit were spent in counseling and coordination of care and discussion patient care team and answering questions about the stroke and seizures.  Delia Heady, MD Medical Director [facility] Pager: [phone] 11/17/2023 4:42 PM   To contact Stroke Continuity provider, please refer to [email or web address]. After hours, contact General Neurology

## 2023-11-17 NOTE — Progress Notes (Signed)
[facility] Progress Note  Patient: Carolyn Russo    ZOX:[id number]  DOA: 11/16/2023     Date of Service: the patient was seen and examined on 11/17/2023  Chief Complaint  Patient presents with   Chest Pain   Dialysis pt   Brief hospital course: Carolyn Russo is a 73 y.o. female with PMH of ESRD on HD (MWF), CHF, CVA with residual left-sided weakness, type 2 diabetes, right BKA, seizure disorder presented at [facility] on 11/16/23, with c/o worsening left chest pain radiating to the arm.  She said pain is most significant over the shoulder blade on the back.  Describes pain as sharp pain worse with positional change especially when she leans to the left side. Rate 10/10 She has tried Tylenol and heat pad which have provided relief to the pain.   Of note recently admitted for similar chest pain back in 10/23/2024 overall workup was generally unremarkable.  Time patient and husband endorsed chest pain started after a recent mammogram.  EKG and x-ray x-ray at the time were unremarkable.  Troponin at the time was 66.  Echo showed EF of 55-60% with moderate left ventricular hypertrophy and mild aortic valve calcification.  Patient was also having intermittent confusion.  Patient was further found to be hypoxic on room air in the ER and patient has been placed on 2 to 3 L/min of supplementary oxygen.  Now saturating 97%.  Patient troponin was minimally elevated to 200s.  Patient also had a CT chest angiogram done with no evidence of pulmonary embolism.    Assessment and Plan:  # Acute encephalopathy Acute metabolic encephalopathy could be secondary to COVID viral infection and RSV infection along with acute CVA and seizure disorder. Continue supportive care Continue delirium precautions   # Acute CVA Patient has prior history of CVA with residual left sided weakness MRI positive for acute to subacute CVA Continue aspirin and Brilinta when able to swallow Continue statin LDL 37, way below  goal  <70, TSH 1.5 WNL Follow MRI brain Follow carotid duplex Follow neurology for further recommendation   # Seizure disorder, patient is on Keppra EEG shows chronic seizures Continue Keppra 750 mg twice daily and additional dose after hemodialysis Continue seizure precautions   # Pneumonia due to COVID and RSV along with possible bacterial superinfection Continue ceftriaxone azithromycin Continue steroids tapering dose PPI for GI prophylaxis Continue Breo Ellipta inhaler and DuoNeb every 6 hourly Mucinex 600 mg p.o. twice daily Procalcitonin 0.86 slightly elevated not very impressive WBC count within normal range May de-escalate antibiotics after improvement   # ESRD on hemodialysis, MWF schedule Consulted for hemodialysis  # Diabetes mellitus type 2 Held home regimen for now Continue NovoLog sliding scale Monitor CBG and continue diabetic diet when able to swallow   # Constipation Continue laxatives Use enema if needed    Diet: NPO until cleared by SLP eval DVT Prophylaxis: Heparin subcu  Advance goals of care discussion: Full code  Family Communication: family was not present at bedside, at the time of interview.  The pt provided permission to discuss medical plan with the family. Opportunity was given to ask question and all questions were answered satisfactorily.   Disposition:  Pt is from SNF, admitted with acute stroke and pneumonia due to COVID and RSV, which precludes a safe discharge. Discharge to SNF, when stable, may need 1 to 2 days to improve.  Subjective: No significant events overnight, in the morning time patient was complaining of  some chest congestion and left-sided weakness, left shoulder pain.  No any other complaints.  Physical Exam: General: NAD, lying comfortably Appear in no distress, affect appropriate Eyes: PERRLA ENT: Oral Mucosa Clear, moist  Neck: no JVD,  Cardiovascular: S1 and S2 Present, no Murmur,  Respiratory: Equal air  entry bilaterally, bilateral crackles, no significant wheezing. Abdomen: Bowel Sound present, Soft and no tenderness,  Skin: no rashes Extremities: no Pedal edema, no calf tenderness Neurologic: Left-sided residual weakness due to prior CVA. Gait not checked due to patient safety concerns  Vitals:   11/17/23 0545 11/17/23 0700 11/17/23 0856 11/17/23 1121  BP: 125/70  111/78 (!) 154/88  Pulse: 93  95 89  Resp: 19  18 (!) 26  Temp:  98.3 F (36.8 C) 97.8 F (36.6 C) 97.9 F (36.6 C)  TempSrc:  Oral Oral Oral  SpO2: 100%  100% 92%    Intake/Output Summary (Last 24 hours) at 11/17/2023 1623 Last data filed at 11/16/2023 2216 Gross per 24 hour  Intake 3 ml  Output --  Net 3 ml   There were no vitals filed for this visit.  Data Reviewed: I have personally reviewed and interpreted daily labs, tele strips, imagings as discussed above. I reviewed all nursing notes, pharmacy notes, vitals, pertinent old records I have discussed plan of care as described above with RN and patient/family.  CBC: Recent Labs  Lab 11/16/23 1044 11/16/23 1657 11/16/23 2115 11/17/23 0012 11/17/23 0349  WBC 9.0  --   --   --  8.0  HGB 9.9* 8.8* 11.2* 9.9* 10.3*  HCT 29.3* 26.0* 33.0* 29.0* 30.3*  MCV 87.7  --   --   --  87.6  PLT 284  --   --   --  301   Basic Metabolic Panel: Recent Labs  Lab 11/16/23 1044 11/16/23 1657 11/16/23 2115 11/17/23 0012 11/17/23 0349 11/17/23 1200  NA 136 137 136 137 139  --   K 3.3* 5.8* 3.4* 5.2* 4.1  --   CL 93*  --   --   --  98  --   CO2 28  --   --   --  26  --   GLUCOSE 106*  --   --   --  117*  --   BUN 19  --   --   --  22  --   CREATININE 4.79*  --   --   --  5.68*  --   CALCIUM 8.0*  --   --   --  7.9*  --   PHOS  --   --   --   --   --  4.7*    Studies: EEG adult Result Date: 11/17/2023 Charlsie Quest, MD     11/17/2023 10:44 AM Patient Name: [name] MRN: [id number] Epilepsy Attending: Charlsie Quest Referring Physician/Provider:  Caryl Pina, MD Date: 11/17/2023 Duration: 23.09 mins Patient history: 73 year old female with history of seizures who presented with altered mental status.  EEG to evaluate for seizure. Level of alertness: Awake AEDs during EEG study: LEV Technical aspects: This EEG study was done with scalp electrodes positioned according to the 10-20 International system of electrode placement. Electrical activity was reviewed with band pass filter of 1-70Hz , sensitivity of 7 uV/mm, display speed of 35mm/sec with a 60Hz  notched filter applied as appropriate. EEG data were recorded continuously and digitally stored.  Video monitoring was available and reviewed as appropriate. Description: The posterior dominant rhythm consists  of 8 Hz activity of moderate voltage (25-35 uV) seen predominantly in posterior head regions, symmetric and reactive to eye opening and eye closing. EEG showed intermittent generalized 3 to 6 Hz theta-delta slowing.spikes were noted in right temporoparietal region. Hyperventilation and photic stimulation were not performed.   Of note, study was technically difficult due to significant movement artifact. 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  Additionally there is mild diffuse encephalopathy. No seizures were seen throughout the recording. Charlsie Quest   MR BRAIN WO CONTRAST Result Date: 11/17/2023 CLINICAL DATA:  Initial evaluation for acute TIA. EXAM: MRI HEAD WITHOUT CONTRAST TECHNIQUE: Multiplanar, multiecho pulse sequences of the brain and surrounding structures were obtained without intravenous contrast. COMPARISON:  Prior CT from earlier the same day as well as prior CTA from 06/22/2022. FINDINGS: Brain: Examination degraded by motion artifact. Diffuse prominence of the CSF containing spaces compatible with atrophy, most pronounced  about the frontal lobes bilaterally. Patchy and confluent T2/FLAIR hyperintensity involving the periventricular deep white matter both cerebral hemispheres, consistent with chronic small vessel ischemic disease, moderate to advanced in nature. Patchy involvement of the pons noted. Remote lacunar infarct present at the right basal ganglia. Associated wallerian degeneration noted. Remote lacunar infarct noted within the left cerebellum as well. A 1.3 cm focus of diffusion signal abnormality involving the inferior right cerebellum, consistent with an acute to early subacute right PICA distribution infarct. No associated hemorrhage or significant mass effect. No other evidence for acute or subacute ischemia. No acute intracranial hemorrhage. Few scattered chronic micro hemorrhages noted, likely hypertensive in nature. No mass lesion, midline shift or mass effect. Mild ventricular prominence related to global parenchymal volume loss without hydrocephalus. No extra-axial fluid collection. Pituitary gland suprasellar region within normal limits. Vascular: Loss of normal flow void within the intradural right V4 segment, which could reflect slow flow and/or occlusion. This appears to be new from prior CTA from 06/22/2022. Major intracranial vascular flow voids are otherwise maintained. Skull and upper cervical spine: Craniocervical junction within normal limits. Decreased T1 signal intensity noted within the visualized bone marrow, nonspecific, but most commonly related to anemia, smoking or obesity. No scalp soft tissue abnormality. Sinuses/Orbits: Bilateral ocular lens replacement with axial myopia. Scattered mucosal thickening noted about the paranasal sinuses. Superimposed air-fluid level within the right maxillary sinus. Small to moderate bilateral mastoid effusions. Negative nasopharynx. Other: None. IMPRESSION: 1. 1.3 cm acute to early subacute right PICA distribution infarct. No associated hemorrhage or significant  mass effect. 2. Loss of normal flow void within the intradural right V4 segment, which could reflect slow flow and/or occlusion. This appears to be new from prior CTA from 06/22/2022. 3. Underlying moderate to advanced chronic microvascular ischemic disease with remote lacunar infarcts involving the right basal ganglia and left cerebellum. 4. Underlying atrophy, most pronounced about the frontal lobes bilaterally. 5. Paranasal sinus disease with air-fluid level within the right maxillary sinus. Clinical correlation for possible acute sinusitis recommended. Electronically Signed   By: Rise Mu M.D.   On: 11/17/2023 00:40   DG Chest Portable 1 View Result Date: 11/17/2023 CLINICAL DATA:  Altered mental status EXAM: PORTABLE CHEST 1 VIEW COMPARISON:  11/16/2023 FINDINGS: Right dialysis catheter is unchanged. Cardiomegaly with vascular congestion and mild interstitial and airspace opacities compatible with edema. No effusions or acute bony abnormality. IMPRESSION: Cardiomegaly, vascular congestion.  Mild pulmonary edema. Electronically Signed   By: Caryn Bee  Dover M.D.   On: 11/17/2023 00:26   CT Angio Chest PE W and/or Wo Contrast Result Date: 11/16/2023 CLINICAL DATA:  Chest pain, Pulmonary embolism (PE) suspected, high prob EXAM: CT ANGIOGRAPHY CHEST WITH CONTRAST TECHNIQUE: Multidetector CT imaging of the chest was performed using the standard protocol during bolus administration of intravenous contrast. Multiplanar CT image reconstructions and MIPs were obtained to evaluate the vascular anatomy. RADIATION DOSE REDUCTION: This exam was performed according to the departmental dose-optimization program which includes automated exposure control, adjustment of the mA and/or kV according to patient size and/or use of iterative reconstruction technique. CONTRAST:  75mL OMNIPAQUE IOHEXOL 350 MG/ML SOLN COMPARISON:  11/16/2023, 10/24/2023 FINDINGS: Cardiovascular: This is a technically adequate evaluation of  the pulmonary vasculature. No filling defects or pulmonary emboli. The heart is enlarged without pericardial effusion. No evidence of thoracic aortic aneurysm or dissection. Stable aortic valve calcification, aortic atherosclerosis, and atherosclerosis throughout the coronary vasculature. Right internal jugular dialysis catheter tip within the atriocaval junction. Mediastinum/Nodes: Stable right hilar adenopathy measuring up to 11 mm in short axis. Thyroid, trachea, and esophagus are unremarkable. Small hiatal hernia. Lungs/Pleura: There are dependent areas of consolidation within the lungs bilaterally, favoring hypoventilatory changes over airspace disease. Trace bilateral pleural fluid. No pneumothorax. The central airways are patent. Upper Abdomen: No acute abnormality. Musculoskeletal: No acute or destructive bony abnormalities. Reconstructed images demonstrate no additional findings. Review of the MIP images confirms the above findings. IMPRESSION: 1. No evidence of pulmonary embolus. 2. Dependent areas of consolidation within the lungs bilaterally, favor hypoventilatory changes over airspace disease. 3. Trace bilateral pleural effusions. 4. Aortic Atherosclerosis (ICD10-I70.0). Coronary artery atherosclerosis. Electronically Signed   By: Sharlet Salina M.D.   On: 11/16/2023 19:07   CT HEAD WO CONTRAST ( ) Result Date: 11/16/2023 CLINICAL DATA:  Head trauma, minor (Age >= 65y) EXAM: CT HEAD WITHOUT CONTRAST TECHNIQUE: Contiguous axial images were obtained from the base of the skull through the vertex without intravenous contrast. RADIATION DOSE REDUCTION: This exam was performed according to the departmental dose-optimization program which includes automated exposure control, adjustment of the mA and/or kV according to patient size and/or use of iterative reconstruction technique. COMPARISON:  CT head 06/22/2022. FINDINGS: Brain: Infarct in the inferior right cerebellum which is new since the 06/12/2022.  No evidence of acute hemorrhage, hydrocephalus, extra-axial collection or mass lesion/mass effect. Similar patchy white matter hypodensities, nonspecific but compatible with chronic microvascular ischemic disease. Similar remote perforator infarct in the right basal ganglia. Vascular: Calcific atherosclerosis.  No hyperdense vessel. Skull: No acute fracture. Sinuses/Orbits: Moderate right maxillary sinus mucosal thickening. No acute orbital findings. Other: No mastoid effusions. IMPRESSION: 1. Infarct in the inferior right cerebellum which is new since 06/12/2022 but otherwise age indeterminate. If there is concern for acute infarct, recommend MRI head. 2. No acute hemorrhage. 3. Similar chronic microvascular ischemic changes. Electronically Signed   By: Feliberto Harts M.D.   On: 11/16/2023 19:06    Scheduled Meds:  [START ON 11/18/2023]  stroke: early stages of recovery book   Does not apply Once   aspirin  300 mg Rectal Daily   Or   aspirin  325 mg Oral Daily   bisacodyl  10 mg Oral QHS   bisacodyl  10 mg Oral Once   Chlorhexidine Gluconate Cloth  6 each Topical Q0600   dextromethorphan-guaiFENesin  1 tablet Oral BID   fluticasone furoate-vilanterol  1 puff Inhalation Daily   heparin injection (subcutaneous)  5,000 Units Subcutaneous Q8H   insulin  aspart  0-5 Units Subcutaneous QHS   insulin aspart  0-6 Units Subcutaneous TID WC   ipratropium-albuterol  3 mL Nebulization Q4H   methylPREDNISolone (SOLU-MEDROL) injection  60 mg Intravenous Q12H   pantoprazole (PROTONIX) IV  40 mg Intravenous Q24H   polyethylene glycol  17 g Oral BID   sevelamer carbonate  800 mg Oral TID WC   sodium chloride flush  3 mL Intravenous Q12H   ticagrelor  90 mg Oral BID   Continuous Infusions:  azithromycin Stopped (11/17/23 0154)   cefTRIAXone (ROCEPHIN)  IV Stopped (11/17/23 0020)   levETIRAcetam     levETIRAcetam     PRN Meds: acetaminophen **OR** acetaminophen, bisacodyl, chlorpheniramine-HYDROcodone,  hydrALAZINE, labetalol, nitroGLYCERIN  Time spent: 55 minutes  Author: Gillis Santa. MD [facility] 11/17/2023 4:23 PM  To reach On-call, see care teams to locate the attending and reach out to them via [email or web address]. If 7PM-7AM, please contact night-coverage If you still have difficulty reaching the attending provider, please page the [facility] (Director on Call) for [facility] on amion for assistance.

## 2023-11-17 NOTE — Progress Notes (Addendum)
[facility] night cross cover note:   I was notified by RN that the patient has returned from MRI and is now more somnolent relative to her mental status immediately leading up to MRI. VS at this time appear stable, including afebrile, BP 165/89, with O2 sat of 100% on 2 L nasal cannula, which she has been on throughout the night shift.  Glucose 80.  I-STAT VBG notable for 7.585/28.5.   Per chart review, including review of admission H&P, the patient was admitted earlier this evening for acute encephalopathy, demonstrating fluctuating mental status throughout her ED course.  Felt to be metabolic in nature, with suspected contribution from her presenting community-acquired pneumonia for which she is on azithromycin and Rocephin.  It is noted that she had a CT head earlier this evening, which showed no evidence of acute intracranial bleed while showing evidence of chronic infarct, with repeat on aforementioned MRI brain currently pending.  Additional metabolic workup notable for ammonia level 17.  She has a history of end-stage renal disease on hemodialysis, and is due for her next routine scheduled hemodialysis session in the morning, on 11/17/2023.  Presenting BMP was notable for BUN 19, creatinine 4.79.  Repeat BMP has been ordered for the morning.  Will also order UA at this time, although I am unsure if she continues to produce urine in the setting of her ESRD. Repeat CXR was previously ordered, with formal read currently pending. Will also add-on TSH level.     Newton Pigg, DO Hospitalist

## 2023-11-17 NOTE — Procedures (Signed)
Patient Name: Carolyn Russo  MRN: [id number]  Epilepsy Attending: Charlsie Quest  Referring Physician/Provider: Caryl Pina, MD  Date: 11/17/2023 Duration: 23.09 mins  Patient history: 73 year old female with history of seizures who presented with altered mental status.  EEG to evaluate for seizure.  Level of alertness: Awake  AEDs during EEG study: LEV  Technical aspects: This EEG study was done with scalp electrodes positioned according to the 10-20 International system of electrode placement. Electrical activity was reviewed with band pass filter of 1-70Hz , sensitivity of 7 uV/mm, display speed of 7mm/sec with a 60Hz  notched filter applied as appropriate. EEG data were recorded continuously and digitally stored.  Video monitoring was available and reviewed as appropriate.  Description: The posterior dominant rhythm consists of 8 Hz activity of moderate voltage (25-35 uV) seen predominantly in posterior head regions, symmetric and reactive to eye opening and eye closing. EEG showed intermittent generalized 3 to 6 Hz theta-delta slowing.spikes were noted in right temporoparietal region. Hyperventilation and photic stimulation were not performed.     Of note, study was technically difficult due to significant movement artifact.  ABNORMALITY -Spike,right temporoparietal region - Intermittent slow, generalized  IMPRESSION: This study is consistent with patient history of focal epilepsy arising from right temporoparietal region.  Additionally there is mild diffuse encephalopathy. No seizures were seen throughout the recording.  Nuala Chiles Annabelle Harman

## 2023-11-18 ENCOUNTER — Inpatient Hospital Stay (HOSPITAL_COMMUNITY): Payer: Medicare PPO

## 2023-11-18 DIAGNOSIS — G9340 Encephalopathy, unspecified: Secondary | ICD-10-CM | POA: Diagnosis not present

## 2023-11-18 LAB — CBC
HCT: 30.9 % — ABNORMAL LOW (ref 36.0–46.0)
Hemoglobin: 10.5 g/dL — ABNORMAL LOW (ref 12.0–15.0)
MCH: 29.7 pg (ref 26.0–34.0)
MCHC: 34 g/dL (ref 30.0–36.0)
MCV: 87.3 fL (ref 80.0–100.0)
Platelets: 294 10*3/uL (ref 150–400)
RBC: 3.54 MIL/uL — ABNORMAL LOW (ref 3.87–5.11)
RDW: 18.9 % — ABNORMAL HIGH (ref 11.5–15.5)
WBC: 9 10*3/uL (ref 4.0–10.5)
nRBC: 0.8 % — ABNORMAL HIGH (ref 0.0–0.2)

## 2023-11-18 LAB — BASIC METABOLIC PANEL
Anion gap: 12 (ref 5–15)
BUN: 11 mg/dL (ref 8–23)
CO2: 26 mmol/L (ref 22–32)
Calcium: 8.3 mg/dL — ABNORMAL LOW (ref 8.9–10.3)
Chloride: 98 mmol/L (ref 98–111)
Creatinine, Ser: 3.15 mg/dL — ABNORMAL HIGH (ref 0.44–1.00)
GFR, Estimated: 15 mL/min — ABNORMAL LOW (ref >60)
Glucose, Bld: 98 mg/dL (ref 70–99)
Potassium: 3.7 mmol/L (ref 3.5–5.1)
Sodium: 136 mmol/L (ref 135–145)

## 2023-11-18 LAB — GLUCOSE, CAPILLARY
Glucose-Capillary: 77 mg/dL (ref 70–99)
Glucose-Capillary: 132 mg/dL — ABNORMAL HIGH (ref 70–99)
Glucose-Capillary: 232 mg/dL — ABNORMAL HIGH (ref 70–99)
Glucose-Capillary: 210 mg/dL — ABNORMAL HIGH (ref 70–99)

## 2023-11-18 LAB — MAGNESIUM: Magnesium: 1.9 mg/dL (ref 1.7–2.4)

## 2023-11-18 MED ORDER — LORAZEPAM 0.5 MG PO TABS: 0.5000 mg | ORAL_TABLET | Freq: Once | ORAL | Status: DC | PRN

## 2023-11-18 MED ORDER — LIDOCAINE 5 % EX PTCH
1.0000 | MEDICATED_PATCH | CUTANEOUS | Status: DC
  Administered 2023-11-18 – 2023-12-05 (×18): 1 via TRANSDERMAL
  Filled 2023-11-18 (×18): qty 1

## 2023-11-18 MED ORDER — IPRATROPIUM-ALBUTEROL 0.5-2.5 (3) MG/3ML IN SOLN
3.0000 mL | Freq: Four times a day (QID) | RESPIRATORY_TRACT | Status: DC
Start: 2023-11-18 — End: 2023-11-19
  Administered 2023-11-18 – 2023-11-19 (×4): 3 mL via RESPIRATORY_TRACT
  Filled 2023-11-18 (×4): qty 3

## 2023-11-18 MED ORDER — ASPIRIN 81 MG PO TBEC
81.0000 mg | DELAYED_RELEASE_TABLET | Freq: Every day | ORAL | Status: DC
  Administered 2023-11-19 – 2023-11-22 (×4): 81 mg via ORAL
  Filled 2023-11-18 (×4): qty 1

## 2023-11-18 NOTE — Evaluation (Signed)
Speech Language Pathology Evaluation Patient Details Name: Carolyn Russo MRN: [id number] DOB: 02/01/1951 Today's Date: 11/18/2023 Time: 1914-7829 SLP Time Calculation (min) (ACUTE ONLY): 15 min  Problem List:  Patient Active Problem List   Diagnosis Date Noted   Troponin I above reference range 11/16/2023   CAP (community acquired pneumonia) 11/16/2023   Pneumonia 11/16/2023   Elevated troponin 10/24/2023   Hypokalemia 10/24/2023   Memory loss 10/24/2023   History of seizure disorder 10/24/2023   GERD (gastroesophageal reflux disease) 10/24/2023   Primary insomnia 09/11/2023   Wheelchair dependence 06/09/2023   Hx of right BKA (HCC) 03/10/2023   CHF (congestive heart failure) (HCC) 02/10/2023   Dyspnea 02/09/2023   Acute respiratory distress 02/09/2023   Urinary tract infection without hematuria    Diabetes mellitus due to underlying condition with hypoglycemia without coma, with long-term current use of insulin (HCC)    Seizure disorder (HCC) 06/22/2022   Acute respiratory failure (HCC)    Unresponsiveness    Pressure ulcer 06/07/2022   Acute stroke due to ischemia (HCC) 06/03/2022   Chest pain 06/01/2022   History of CVA with residual deficit 06/01/2022   Seizure (HCC)    Acute encephalopathy    Below-knee amputation of right lower extremity (HCC)    Leukocytosis    Foot infection 05/10/2022   PAD (peripheral artery disease) (HCC) 05/10/2022   Gangrene of right foot (HCC) 04/15/2022   Left hemiparesis (HCC) 09/24/2021   Abnormality of gait 09/24/2021   Pain of right hand 08/06/2021   Steal syndrome of dialysis vascular access (HCC) 08/06/2021   Subclavian steal syndrome of right subclavian artery 06/25/2021   Right hand weakness 06/25/2021   Stroke-like symptoms 05/06/2021   Bandemia    Cerebrovascular accident (CVA) of right basal ganglia (HCC) 04/20/2021   Hemorrhoids    Basal ganglia infarction (HCC) 04/16/2021   ESRD on dialysis (HCC) 04/16/2021    Hypertensive urgency 03/10/2021   Hilar enlargement    Anemia of chronic disease    Chronic diastolic CHF (congestive heart failure) (HCC) 03/09/2021   CKD (chronic kidney disease), stage V (HCC) 03/08/2021   Diabetic retinopathy (HCC) 03/08/2021   Hyperglycemia due to type 2 diabetes mellitus (HCC) 03/08/2021   Pure hypercholesterolemia 03/08/2021   Anemia in chronic kidney disease 09/24/2020   Uncontrolled type 2 diabetes mellitus with hyperglycemia (HCC) 03/19/2019   Osteopenia 09/04/2018   Migraine 09/04/2018   Asthma 09/04/2018   Pseudophakia of both eyes 07/17/2018   Pseudophakia of left eye 07/03/2018   Open angle with borderline findings and high glaucoma risk in left eye 07/03/2018   Age-related nuclear cataract of right eye 07/03/2018   CAD (coronary artery disease), native coronary artery 04/27/2017   Abnormal nuclear stress test 04/04/2017   Shortness of breath 03/09/2017   Appendicitis    Age-related hypermature cataract of both eyes 12/20/2016   Uterine leiomyoma 11/27/2012   Dyslipidemia (high LDL; low HDL) 11/25/2011   Obesity (BMI 30-39.9) 11/25/2011   Hypertensive disorder 11/21/2011   Type 2 diabetes mellitus (HCC) 11/21/2011   Abnormal EKG 11/21/2011   Past Medical History:  Past Medical History:  Diagnosis Date   Acute GI bleeding    Allergy    Anemia    Anterior chest wall pain    Appendicitis 1965   Asthma    Body mass index 37.0-37.9, adult    Breast pain    Cataract    both eyes   CHF (congestive heart failure) (HCC)    Cognitive change  04/20/2021   r/t cva 03/2821   Complication of anesthesia    Memory loss after general   Dehydration 2014   Deviated septum 1971   Diabetes mellitus    Dysphagia due to old stroke    easy to get strangled when eating   Dyspnea 2014   ESRD on hemodialysis (HCC)    MWF at [facility]   Extrinsic asthma    WITH ASTHMA ATTACK   Fibroid 1980   GERD (gastroesophageal reflux disease)    Heart murmur    History of  migraine    History of seizure    with stroke   Hx gestational diabetes    Hyperlipidemia    Hypertension 2014   Inguinal hernia 1959   Malaise and fatigue 2014   Non-IgE mediated allergic asthma 2014   Obesity    Pelvic pain    Pregnancy, high-risk 1985   Stroke (HCC) 04/20/2021   (CVA) of right basal ganglia   Tonsillitis 1968   Uterine fibroid 1980   Visual field defect    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   Past Surgical History:  Past Surgical History:  Procedure Laterality Date   ABDOMINAL AORTOGRAM W/LOWER EXTREMITY N/A 02/21/2022   Procedure: ABDOMINAL AORTOGRAM W/LOWER EXTREMITY;  Surgeon: Maeola Harman, MD;  Location: [facility] INVASIVE CV LAB;  Service: Cardiovascular;  Laterality: N/A;   AMPUTATION Right 05/18/2022   Procedure: RIGHT BELOW KNEE AMPUTATION;  Surgeon: Nadara Mustard, MD;  Location: [facility] OR;  Service: Orthopedics;  Laterality: Right;   AMPUTATION TOE Right 04/15/2022   Procedure: AMPUTATION  LST TOERIGHT FOOT;  Surgeon: Edwin Cap, DPM;  Location: [facility] ORS;  Service: Podiatry;  Laterality: Right;   APPENDECTOMY  1959   BASCILIC VEIN TRANSPOSITION Right 04/30/2021   Procedure: RIGHT FIRST STAGE BASCILIC VEIN TRANSPOSITION;  Surgeon: Chuck Hint, MD;  Location: [facility] OR;  Service: Vascular;  Laterality: Right;   CESAREAN SECTION  1985   COLONOSCOPY     ESOPHAGOGASTRODUODENOSCOPY (EGD) WITH PROPOFOL N/A 04/18/2021   Procedure: ESOPHAGOGASTRODUODENOSCOPY (EGD) WITH PROPOFOL;  Surgeon: Iva Boop, MD;  Location: [facility] ENDOSCOPY;  Service: Endoscopy;  Laterality: N/A;   EYE SURGERY     bilateral cataract    FLEXIBLE SIGMOIDOSCOPY N/A 04/18/2021   Procedure: FLEXIBLE SIGMOIDOSCOPY;  Surgeon: Iva Boop, MD;  Location: [facility] ENDOSCOPY;  Service: Endoscopy;  Laterality: N/A;   HERNIA REPAIR  1959   IR FLUORO GUIDE CV LINE RIGHT  03/18/2021   IR US GUIDE VASC ACCESS RIGHT  03/18/2021   LEFT HEART CATH AND CORONARY ANGIOGRAPHY N/A 04/04/2017   Procedure: Left  Heart Cath and Coronary Angiography;  Surgeon: Lyn Records, MD;  Location: [facility] INVASIVE CV LAB;  Service: Cardiovascular;  Laterality: N/A;   MYOMECTOMY  1980, 2004, 2007   PERIPHERAL VASCULAR THROMBECTOMY  02/21/2022   Procedure: PERIPHERAL VASCULAR THROMBECTOMY;  Surgeon: Maeola Harman, MD;  Location[facility] INVASIVE CV LAB;  Service: Cardiovascular;;  Right Tibial   RHINOPLASTY  1971   ROTATOR CUFF REPAIR  2003   SURGICAL REPAIR OF HEMORRHAGE  2015   TONSILLECTOMY  1968   HPI:  Patient is a 73 y.o. female with PMH: CVA in 2022 and 2023, right BKA, HTN, DM-2, HLD, ESRD on HD MWF, asthma, dysphagia, CHF, heart murmur, seizure. She presented to the hospital from SNF on 11/16/23 for chest pain, weakness and confusion. CT head revealed infarct in inferior right cerebellum which was new since 06/12/22. MRI brain with acute right  inferomedial cerebellar ischemic infarction in the right PICA territory. Pt also diagnosed with Covid/RSV.   Assessment / Plan / Recommendation Clinical Impression  Patient presents with modeately impaired cognition but difficult to determine how close to baseline she is, given her h/o CVA's with resultant cognitive impairment. Paitent was oriented to time, place, basic situation. She spoke in a monotone voice which is her baseline. (this SLP has worked with her in CIR in 2022) She was able to self-feed but required setup assitance which she would request. Her attention was poor overall and she would frequently repeat questions and did not demonstrate recall of answers SLP had already given her. She was confused and would ask SLP "did you make the applesauce?" After moderate intensity of a coughing spell, she then [place] acted as though nothing had happened, going to right back to eating and telling RN how delicious the mashed potatoes were. SLP will f/u at least one time to try to determine if she is close to her baseline for cognition.    SLP Assessment  SLP  Recommendation/Assessment: Patient needs continued Speech Lanaguage Pathology Services SLP Visit Diagnosis: Cognitive communication deficit (R41.841)    Recommendations for follow up therapy are one component of a multi-disciplinary discharge planning process, led by the attending physician.  Recommendations may be updated based on patient status, additional functional criteria and insurance authorization.    Follow Up Recommendations  Skilled nursing-short term rehab (<3 hours/day)    Assistance Recommended at Discharge  Frequent or constant Supervision/Assistance  Functional Status Assessment Patient has had a recent decline in their functional status and demonstrates the ability to make significant improvements in function in a reasonable and predictable amount of time.  Frequency and Duration min 2x/week  1 week      SLP Evaluation Cognition  Overall Cognitive Status: No family/caregiver present to determine baseline cognitive functioning Arousal/Alertness: Awake/alert Orientation Level: Oriented X4 Year: 2025 Month: January Day of Week: Correct Attention: Sustained;Selective Sustained Attention: Appears intact Selective Attention: Impaired Selective Attention Impairment: Verbal basic;Verbal complex;Functional basic Memory: Impaired Memory Impairment: Decreased recall of new information Awareness: Impaired Awareness Impairment: Emergent impairment Problem Solving: Impaired Problem Solving Impairment: Functional basic       Comprehension  Auditory Comprehension Overall Auditory Comprehension: Appears within functional limits for tasks assessed Yes/No Questions: Within Functional Limits Commands: Within Functional Limits Conversation: Simple Interfering Components: Processing speed;Attention EffectiveTechniques: Extra processing time Visual Recognition/Discrimination Discrimination: Not tested Reading Comprehension Reading Status: Not tested    Expression  Expression Primary Mode of Expression: Verbal Verbal Expression Overall Verbal Expression: Appears within functional limits for tasks assessed   Oral / Motor  Oral Motor/Sensory Function Overall Oral Motor/Sensory Function: Mild impairment Facial ROM: Reduced left Facial Symmetry: Abnormal symmetry left Facial Sensation: Reduced left            Angela Nevin, MA, CCC-SLP Speech Therapy

## 2023-11-18 NOTE — Evaluation (Signed)
Physical Therapy Evaluation Patient Details Name: Carolyn Russo MRN: [id number] DOB: 08-16-1951 Today's Date: 11/18/2023  History of Present Illness  Pt is a 73 yo female presenting to [facility] on 11/16/23 for chest pain, weakness, and confusion. CT head revealed infarct in the inferior right cerebellum which was new since 06/12/2022, MRI brain with acute right inferomedial cerebellar ischemic infarction in the right PICA territory. Pt also diagnosed with Covid/RSV. PMH: R BKA on 7/26, DM2, HTN, HLD, ESRD on HD MWF, hx of CVA 03/2021 & 05/2022, asthma, CHF, dysphagia, heart murmur, seizure  Clinical Impression  Pt is presenting with continued deficits. Pt had recent admission and was at [facility] rehabilitation. Currently pt is Mod A +2 for bed mobility, Min A +2 for sit to stand from EOB and Mod A +2 for transfers with RW. Due to pt current functional status, home set up and available assistance at home recommending skilled physical therapy services < 3 hours/day in order to address strength, balance and functional mobility to decrease risk for falls, injury, immobility, skin break down and re-hospitalization.        If plan is discharge home, recommend the following: Assistance with cooking/housework;Assist for transportation;Help with stairs or ramp for entrance;Two people to help with walking and/or transfers   Can travel by private vehicle   No    Equipment Recommendations Other (comment) (defer to post acute)     Functional Status Assessment Patient has had a recent decline in their functional status and demonstrates the ability to make significant improvements in function in a reasonable and predictable amount of time.     Precautions / Restrictions Precautions Precautions: Fall Required Braces or Orthoses: Other Brace Other Brace: RLE prosthetic Restrictions Weight Bearing Restrictions Per Provider Order: Yes RLE Weight Bearing Per Provider Order: Non weight bearing Other  Position/Activity Restrictions: R BKA- prosthetic in room      Mobility  Bed Mobility Overal bed mobility: Needs Assistance Bed Mobility: Supine to Sit     Supine to sit: Mod assist, +2 for physical assistance          Transfers Overall transfer level: Needs assistance   Transfers: Sit to/from Stand, Bed to chair/wheelchair/BSC Sit to Stand: Min assist, +2 physical assistance, +2 safety/equipment Stand pivot transfers: Mod assist, +2 physical assistance, +2 safety/equipment         General transfer comment: Pt stands with BLE close together, narrow BOS, cues to align body with chair before sitting and posterior hip position needing cues to help shift anteriorly for a more upright standing position    Ambulation/Gait             Pre-gait activities: Worked on stepping from EOB to recliner. Pt requires heavy multi modal cueing to progress LE toward recliner from EOB with good stepping using prosthetic and more difficulty wgt shifting over the LLE. Pt has very narrow BOS due to prior BKA without prosthetic most likely.      Balance Overall balance assessment: Needs assistance Sitting-balance support: Single extremity supported, Feet supported Sitting balance-Leahy Scale: Fair     Standing balance support: Bilateral upper extremity supported, During functional activity, Reliant on assistive device for balance Standing balance-Leahy Scale: Poor Standing balance comment: Reliant on UE support         Pertinent Vitals/Pain Pain Assessment Pain Assessment: Faces Faces Pain Scale: Hurts little more Breathing: normal Negative Vocalization: occasional moan/groan, low speech, negative/disapproving quality Facial Expression: smiling or inexpressive Body Language: tense, distressed pacing, fidgeting Consolability: distracted or  reassured by voice/touch PAINAD Score: 3 Pain Location: Chest pains- reported after session when pt was eating Pain Descriptors / Indicators:  Aching, Discomfort Pain Intervention(s): Monitored during session, Limited activity within patient's tolerance, Other (comment) (RN aware and en route to pt room)    Home Living Family/patient expects to be discharged to:: Skilled nursing facility ([facility])       Prior Function     Mobility Comments: Working on standing with RW. ADLs Comments: assist with ADLs (feeding, eating, dressing)     Extremity/Trunk Assessment   Upper Extremity Assessment Upper Extremity Assessment: Defer to OT evaluation LUE Deficits / Details: decreased initiation; ~15 degrees active shoulder flexion, 30 degrees shoulder abduction, ~100 degrees elbow flexion, Able to extend digits and make fist all with increased time. AAROm with RUE asisst and min A from OT to bring LUE up onto RW. L middle finger trigger finger    Lower Extremity Assessment Lower Extremity Assessment: Generalized weakness RLE Deficits / Details: Hx R BKA.   ROM WFL    Cervical / Trunk Assessment Cervical / Trunk Assessment: Kyphotic  Communication   Communication Communication: Hearing impairment Cueing Techniques: Verbal cues;Gestural cues  Cognition Arousal: Alert Behavior During Therapy: WFL for tasks assessed/performed Overall Cognitive Status: No family/caregiver present to determine baseline cognitive functioning     Following Commands: Follows one step commands with increased time Safety/Judgement: Decreased awareness of safety, Decreased awareness of deficits   Problem Solving: Slow processing General Comments: Slow with commands, needs increased time. Needs cues to safely position body before sitting        General Comments General comments (skin integrity, edema, etc.): HR O2 sats remained stable throughout session.        Assessment/Plan    PT Assessment Patient needs continued PT services  PT Problem List Decreased strength;Cardiopulmonary status limiting activity;Decreased coordination;Decreased range  of motion;Decreased cognition;Decreased activity tolerance;Decreased knowledge of use of DME;Decreased balance;Decreased mobility;Decreased safety awareness;Decreased knowledge of precautions       PT Treatment Interventions DME instruction;Therapeutic exercise;Gait training;Balance training;Functional mobility training;Therapeutic activities;Patient/family education;Cognitive remediation;Modalities;Wheelchair mobility training;Neuromuscular re-education    PT Goals (Current goals can be found in the Care Plan section)  Acute Rehab PT Goals Patient Stated Goal: return to rehab to improve mobility PT Goal Formulation: With patient Time For Goal Achievement: 12/02/23 Potential to Achieve Goals: Fair    Frequency Min 1X/week     Co-evaluation   Reason for Co-Treatment: For patient/therapist safety;Complexity of the patient's impairments (multi-system involvement) PT goals addressed during session: Mobility/safety with mobility;Proper use of DME;Balance OT goals addressed during session: ADL's and self-care       AM-PAC PT "6 Clicks" Mobility  Outcome Measure Help needed turning from your back to your side while in a flat bed without using bedrails?: A Lot Help needed moving from lying on your back to sitting on the side of a flat bed without using bedrails?: A Lot Help needed moving to and from a bed to a chair (including a wheelchair)?: Total Help needed standing up from a chair using your arms (e.g., wheelchair or bedside chair)?: A Lot Help needed to walk in hospital room?: Total Help needed climbing 3-5 steps with a railing? : Total 6 Click Score: 9    End of Session Equipment Utilized During Treatment: Gait belt Activity Tolerance: Patient limited by fatigue;Patient tolerated treatment well Patient left: with call bell/phone within reach;in chair;with chair alarm set Nurse Communication: Mobility status (OT notified RN of pt chest pain) PT Visit  Diagnosis: Other  abnormalities of gait and mobility (R26.89);Muscle weakness (generalized) (M62.81);Unsteadiness on feet (R26.81);Difficulty in walking, not elsewhere classified (R26.2)    Time: 0940-1009 PT Time Calculation (min) (ACUTE ONLY): 29 min   Charges:   PT Evaluation $PT Eval Low Complexity: 1 Low   PT General Charges $$ ACUTE PT VISIT: 1 Visit        Harrel Carina, DPT, CLT  Acute Rehabilitation Services Office: [phone] (Secure chat preferred)   Claudia Desanctis 11/18/2023, 11:26 AM

## 2023-11-18 NOTE — Evaluation (Signed)
Clinical/Bedside Swallow Evaluation Patient Details  Name: Carolyn Russo MRN: [id number] Date of Birth: 07-30-1951  Today's Date: 11/18/2023 Time: SLP Start Time (ACUTE ONLY): 1320 SLP Stop Time (ACUTE ONLY): 1340 SLP Time Calculation (min) (ACUTE ONLY): 20 min  Past Medical History:  Past Medical History:  Diagnosis Date   Acute GI bleeding    Allergy    Anemia    Anterior chest wall pain    Appendicitis 1965   Asthma    Body mass index 37.0-37.9, adult    Breast pain    Cataract    both eyes   CHF (congestive heart failure) (HCC)    Cognitive change 04/20/2021   r/t cva 03/2821   Complication of anesthesia    Memory loss after general   Dehydration 2014   Deviated septum 1971   Diabetes mellitus    Dysphagia due to old stroke    easy to get strangled when eating   Dyspnea 2014   ESRD on hemodialysis (HCC)    MWF at [facility]   Extrinsic asthma    WITH ASTHMA ATTACK   Fibroid 1980   GERD (gastroesophageal reflux disease)    Heart murmur    History of migraine    History of seizure    with stroke   Hx gestational diabetes    Hyperlipidemia    Hypertension 2014   Inguinal hernia 1959   Malaise and fatigue 2014   Non-IgE mediated allergic asthma 2014   Obesity    Pelvic pain    Pregnancy, high-risk 1985   Stroke (HCC) 04/20/2021   (CVA) of right basal ganglia   Tonsillitis 1968   Uterine fibroid 1980   Visual field defect    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   Past Surgical History:  Past Surgical History:  Procedure Laterality Date   ABDOMINAL AORTOGRAM W/LOWER EXTREMITY N/A 02/21/2022   Procedure: ABDOMINAL AORTOGRAM W/LOWER EXTREMITY;  Surgeon: Maeola Harman, MD;  Location: [facility] INVASIVE CV LAB;  Service: Cardiovascular;  Laterality: N/A;   AMPUTATION Right 05/18/2022   Procedure: RIGHT BELOW KNEE AMPUTATION;  Surgeon: Nadara Mustard, MD;  Location: [facility] OR;  Service: Orthopedics;  Laterality: Right;   AMPUTATION TOE Right 04/15/2022   Procedure: AMPUTATION  LST  TOERIGHT FOOT;  Surgeon: Edwin Cap, DPM;  Location: [facility] ORS;  Service: Podiatry;  Laterality: Right;   APPENDECTOMY  1959   BASCILIC VEIN TRANSPOSITION Right 04/30/2021   Procedure: RIGHT FIRST STAGE BASCILIC VEIN TRANSPOSITION;  Surgeon: Chuck Hint, MD;  Location: [facility] OR;  Service: Vascular;  Laterality: Right;   CESAREAN SECTION  1985   COLONOSCOPY     ESOPHAGOGASTRODUODENOSCOPY (EGD) WITH PROPOFOL N/A 04/18/2021   Procedure: ESOPHAGOGASTRODUODENOSCOPY (EGD) WITH PROPOFOL;  Surgeon: Iva Boop, MD;  Location: [facility] ENDOSCOPY;  Service: Endoscopy;  Laterality: N/A;   EYE SURGERY     bilateral cataract    FLEXIBLE SIGMOIDOSCOPY N/A 04/18/2021   Procedure: FLEXIBLE SIGMOIDOSCOPY;  Surgeon: Iva Boop, MD;  Location: [facility] ENDOSCOPY;  Service: Endoscopy;  Laterality: N/A;   HERNIA REPAIR  1959   IR FLUORO GUIDE CV LINE RIGHT  03/18/2021   IR US GUIDE VASC ACCESS RIGHT  03/18/2021   LEFT HEART CATH AND CORONARY ANGIOGRAPHY N/A 04/04/2017   Procedure: Left Heart Cath and Coronary Angiography;  Surgeon: Lyn Records, MD;  Location: [facility] INVASIVE CV LAB;  Service: Cardiovascular;  Laterality: N/A;   MYOMECTOMY  1980, 2004, 2007   PERIPHERAL VASCULAR THROMBECTOMY  02/21/2022  Procedure: PERIPHERAL VASCULAR THROMBECTOMY;  Surgeon: Maeola Harman, MD;  Location: [facility] INVASIVE CV LAB;  Service: Cardiovascular;;  Right Tibial   RHINOPLASTY  1971   ROTATOR CUFF REPAIR  2003   SURGICAL REPAIR OF HEMORRHAGE  2015   TONSILLECTOMY  1968   HPI:  Patient is a 73 y.o. female with PMH: CVA in 2022 and 2023, right BKA, HTN, DM-2, HLD, ESRD on HD MWF, asthma, dysphagia, CHF, heart murmur, seizure. She presented to the hospital from SNF on 11/16/23 for chest pain, weakness and confusion. CT head revealed infarct in inferior right cerebellum which was new since 06/12/22. MRI brain with acute right inferomedial cerebellar ischemic infarction in the right PICA territory. Pt also diagnosed with  Covid/RSV. BSE ordered in addtion to SLE as patient observed by nursing to have coughing with PO's.    Assessment / Plan / Recommendation  Clinical Impression  Patient presenting with clinical s/s of dysphagia as per this bedside swallow evaluation. Aside from mild anterior spillage on left and prolonged mastication and bolus transit, patient did not initially exhibit any overt s/s. While feeding herself some chocolate pudding, patient had a period of coughing which lasted approximately 45 seconds before subsiding. While coughing, patient seemed to be trying to expectorate but was not successful. RN arrived and she did tell SLP that when patient had coughing earlier today, it did not seem to be liquids PO's preceeding the cough, but with solids. After coughing had subsided, patient was back to eating and acting as though nothing had happened. SLP is recommending downgrade solids to Dys 2 (minced) and will follow patient for toleration. SLP Visit Diagnosis: Dysphagia, unspecified (R13.10)    Aspiration Risk  Mild aspiration risk    Diet Recommendation Dysphagia 2 (Fine chop);Thin liquid    Liquid Administration via: Cup;Straw Medication Administration: Whole meds with puree Supervision: Patient able to self feed;Intermittent supervision to cue for compensatory strategies Compensations: Slow rate;Small sips/bites Postural Changes: Seated upright at 90 degrees    Other  Recommendations Oral Care Recommendations: Oral care BID    Recommendations for follow up therapy are one component of a multi-disciplinary discharge planning process, led by the attending physician.  Recommendations may be updated based on patient status, additional functional criteria and insurance authorization.  Follow up Recommendations Skilled nursing-short term rehab (<3 hours/day)      Assistance Recommended at Discharge    Functional Status Assessment Patient has had a recent decline in their functional status and  demonstrates the ability to make significant improvements in function in a reasonable and predictable amount of time.  Frequency and Duration min 2x/week  1 week       Prognosis Prognosis for improved oropharyngeal function: Fair Barriers to Reach Goals: Cognitive deficits;Time post onset      Swallow Study   General Date of Onset: 11/18/23 HPI: Patient is a 73 y.o. female with PMH: CVA in 2022 and 2023, right BKA, HTN, DM-2, HLD, ESRD on HD MWF, asthma, dysphagia, CHF, heart murmur, seizure. She presented to the hospital from SNF on 11/16/23 for chest pain, weakness and confusion. CT head revealed infarct in inferior right cerebellum which was new since 06/12/22. MRI brain with acute right inferomedial cerebellar ischemic infarction in the right PICA territory. Pt also diagnosed with Covid/RSV. BSE ordered in addtion to SLE as patient observed by nursing to have coughing with PO's. Type of Study: Bedside Swallow Evaluation Previous Swallow Assessment: BSEs during previous admissions Diet Prior to this Study: Regular;Thin liquids (  Level 0) Temperature Spikes Noted: No Respiratory Status: Nasal cannula History of Recent Intubation: No Behavior/Cognition: Alert;Cooperative;Pleasant mood;Confused Oral Cavity Assessment: Within Functional Limits Oral Care Completed by SLP: No Self-Feeding Abilities: Able to feed self;Needs set up Patient Positioning: Upright in bed Baseline Vocal Quality: Normal Volitional Cough: Strong Volitional Swallow: Able to elicit    Oral/Motor/Sensory Function Overall Oral Motor/Sensory Function: Mild impairment Facial ROM: Reduced left Facial Symmetry: Abnormal symmetry left Facial Sensation: Reduced left   Ice Chips     Thin Liquid Thin Liquid: Within functional limits Presentation: Self Fed;Straw;Cup    Nectar Thick     Honey Thick     Puree Puree: Impaired Pharyngeal Phase Impairments: Cough - Delayed   Solid     Solid: Impaired Oral Phase  Functional Implications: Prolonged oral transit;Impaired mastication;Left anterior spillage Pharyngeal Phase Impairments: Cough - Delayed     Angela Nevin, MA, CCC-SLP Speech Therapy

## 2023-11-18 NOTE — Evaluation (Signed)
Occupational Therapy Evaluation Patient Details Name: Carolyn Russo MRN: [id number] DOB: 13-May-1951 Today's Date: 11/18/2023   History of Present Illness Pt is a 73 yo female presenting to [facility] on [facility]/16/23 for chest pain, weakness, and confusion. CT head revealed infarct in the inferior right cerebellum which was new since 06/12/2022, MRI brain with acute right inferomedial cerebellar ischemic infarction in the right PICA territory. Pt also diagnosed with Covid/RSV. PMH: R BKA on 7/26, DM2, HTN, HLD, ESRD on HD MWF, hx of CVA 03/2021 & 05/2022, asthma, CHF, dysphagia, heart murmur, seizure   Clinical Impression   Pt admitted for above, some prior LUE deficits from past CVAs but pt can use it functionally to assist with grasping RW and objects. Pt overall needing Mod A+2 for bed mobility and transfers, has a narrow BOS and needs cues to readjust, also needs cues for safety with body positioning. Pt currently needs total A to setup for ADLs, chest pain noted when eating and RN aware and en route to pt. OT to continue following pt acutely to address listed deficits and help transition to next level of care. Patient would benefit from post acute skilled rehab facility with <3 hours of therapy and 24/7 support        If plan is discharge home, recommend the following: A lot of help with bathing/dressing/bathroom;Two people to help with walking and/or transfers;Assistance with cooking/housework;Direct supervision/assist for medications management;Assistance with feeding;Direct supervision/assist for financial management;Assist for transportation;Help with stairs or ramp for entrance    Functional Status Assessment  Patient has had a recent decline in their functional status and demonstrates the ability to make significant improvements in function in a reasonable and predictable amount of time.  Equipment Recommendations  None recommended by OT (defer)    Recommendations for Other Services        Precautions / Restrictions Precautions Precautions: Fall Required Braces or Orthoses: Other Brace Other Brace: RLE prosthetic Restrictions Weight Bearing Restrictions Per Provider Order: Yes RLE Weight Bearing Per Provider Order: Non weight bearing Other Position/Activity Restrictions: R BKA- prosthetic in room      Mobility Bed Mobility Overal bed mobility: Needs Assistance Bed Mobility: Supine to Sit     Supine to sit: Mod assist, +2 for physical assistance          Transfers Overall transfer level: Needs assistance   Transfers: Sit to/from Stand, Bed to chair/wheelchair/BSC Sit to Stand: Min assist, +2 physical assistance, +2 safety/equipment Stand pivot transfers: Mod assist, +2 physical assistance, +2 safety/equipment         General transfer comment: Pt stands with BLE close together, narrow BOS, cues to align body with chair before sitting and posterior hip position needing cues to help shift anteriorly for a more upright standing position      Balance Overall balance assessment: Needs assistance Sitting-balance support: Single extremity supported, Feet supported Sitting balance-Leahy Scale: Fair     Standing balance support: Bilateral upper extremity supported, During functional activity, Reliant on assistive device for balance Standing balance-Leahy Scale: Poor Standing balance comment: Reliant on UE support                           ADL either performed or assessed with clinical judgement   ADL Overall ADL's : Needs assistance/impaired Eating/Feeding: Set up;Sitting   Grooming: Sitting;Minimal assistance   Upper Body Bathing: Sitting;Moderate assistance   Lower Body Bathing: Maximal assistance;Sitting/lateral leans   Upper Body Dressing : Sitting;Maximal  assistance   Lower Body Dressing: Sitting/lateral leans;Total assistance Lower Body Dressing Details (indicate cue type and reason): to don prosthesis and L shoe. Toilet Transfer:  Moderate assistance;+2 for physical assistance;+2 for safety/equipment Toilet Transfer Details (indicate cue type and reason): simulatd with recliner Toileting- Clothing Manipulation and Hygiene: Maximal assistance;+2 for safety/equipment       Functional mobility during ADLs: Moderate assistance;+2 for physical assistance;+2 for safety/equipment (pivots)       Vision   Vision Assessment?: Vision impaired- to be further tested in functional context Additional Comments: Increased blurriness with farther objects, identifies # of therpaists fingers without challenge.     Perception         Praxis         Pertinent Vitals/Pain Pain Assessment Pain Assessment: Faces Faces Pain Scale: Hurts little more Pain Location: Chest pains- reported after session when pt was eating Pain Descriptors / Indicators: Aching, Discomfort Pain Intervention(s): Monitored during session, Limited activity within patient's tolerance, Other (comment) (RN aware and en route to pt room)     Extremity/Trunk Assessment Upper Extremity Assessment Upper Extremity Assessment: Generalized weakness;LUE deficits/detail LUE Deficits / Details: decreased initiation; ~15 degrees active shoulder flexion, 30 degrees shoulder abduction, ~100 degrees elbow flexion, Able to extend digits and make fist all with increased time. AAROm with RUE asisst and min A from OT to bring LUE up onto RW. L middle finger trigger finger           Communication Communication Communication: Hearing impairment (HOH more so on R) Cueing Techniques: Verbal cues;Gestural cues   Cognition Arousal: Alert Behavior During Therapy: WFL for tasks assessed/performed Overall Cognitive Status: No family/caregiver present to determine baseline cognitive functioning                         Following Commands: Follows one step commands with increased time Safety/Judgement: Decreased awareness of safety, Decreased awareness of deficits    Problem Solving: Slow processing General Comments: Slow with commands, needs increased time. Needs cues to safely position body before sitting     General Comments       Exercises     Shoulder Instructions      Home Living Family/patient expects to be discharged to:: Skilled nursing facility ([facility])                                        Prior Functioning/Environment               Mobility Comments: Working on standing with RW. ADLs Comments: assist with ADLs (feeding, eating, dressing)        OT Problem List: Decreased strength;Impaired balance (sitting and/or standing);Decreased safety awareness;Decreased coordination;Decreased activity tolerance;Decreased knowledge of use of DME or AE;Impaired sensation;Impaired UE functional use      OT Treatment/Interventions: Self-care/ADL training;Therapeutic exercise;DME and/or AE instruction;Balance training;Patient/family education;Neuromuscular education;Visual/perceptual remediation/compensation;Cognitive remediation/compensation;Therapeutic activities    OT Goals(Current goals can be found in the care plan section) Acute Rehab OT Goals Patient Stated Goal: To get better OT Goal Formulation: With patient Time For Goal Achievement: 12/02/23 Potential to Achieve Goals: Good ADL Goals Pt Will Perform Lower Body Bathing: sitting/lateral leans;with min assist Pt Will Perform Lower Body Dressing: with mod assist;sitting/lateral leans (with donning prosthetic) Pt Will Transfer to Toilet: with min assist;with +2 assist;stand pivot transfer;bedside commode Pt/caregiver will Perform Home Exercise Program: Increased strength;Both right and  left upper extremity;With Supervision;With written HEP provided  OT Frequency: Min 1X/week    Co-evaluation     PT goals addressed during session: Mobility/safety with mobility;Proper use of DME;Balance OT goals addressed during session: ADL's and self-care      AM-PAC  OT "6 Clicks" Daily Activity     Outcome Measure Help from another person eating meals?: A Little Help from another person taking care of personal grooming?: A Little Help from another person toileting, which includes using toliet, bedpan, or urinal?: A Lot Help from another person bathing (including washing, rinsing, drying)?: A Lot Help from another person to put on and taking off regular upper body clothing?: A Lot Help from another person to put on and taking off regular lower body clothing?: Total 6 Click Score: 13   End of Session Equipment Utilized During Treatment: Gait belt;Rolling walker (2 wheels);Other (comment) (RLE prosthesis) Nurse Communication: Mobility status  Activity Tolerance: Patient tolerated treatment well Patient left: in chair;with call bell/phone within reach;with chair alarm set  OT Visit Diagnosis: Muscle weakness (generalized) (M62.81);Other symptoms and signs involving cognitive function;Pain Hemiplegia - Right/Left:  (chest)                Time: 1610-9604 OT Time Calculation (min): 41 min Charges:  OT General Charges $OT Visit: 1 Visit OT Evaluation $OT Eval Moderate Complexity: 1 Mod OT Treatments $Therapeutic Activity: 8-22 mins  11/18/2023  AB, OTR/L  Acute Rehabilitation Services  Office: [phone]   Tristan Schroeder 11/18/2023, 11:03 AM

## 2023-11-18 NOTE — Progress Notes (Signed)
[facility] Progress Note  Patient: Carolyn Russo    ZOX:[id number]  DOA: 11/16/2023     Date of Service: the patient was seen and examined on 11/18/2023  Chief Complaint  Patient presents with   Chest Pain   Dialysis pt   Brief hospital course: Patient is a 73 year old African-American female, past medical history significant for ESRD on HD (MWF), CHF, CVA with residual left-sided weakness, type 2 diabetes, right BKA, and seizure disorder.  Patient presented at [facility] on 11/16/23, with c/o worsening left chest pain radiating to the arm and left-sided shoulder blade pain (back pain).  Patient has had similar presentation in the past, with negative workup.  Echo showed EF of 55-60% with moderate left ventricular hypertrophy and mild aortic valve calcification.  Patient troponin was minimally elevated to 200s.  Patient also had a CT chest angiogram done with no evidence of pulmonary embolism.  Intermittent confusion was reported, but improving.  11/18/2023: Patient seen alongside patient's husband.  All questions answered.  Patient is improving slowly.   Assessment and Plan:  # Acute encephalopathy -Likely multifactorial. -Acute metabolic encephalopathy could be secondary to COVID viral infection and RSV infection along with acute CVA and seizure disorder. -Encephalopathy is improving. -Continue supportive care -Continue delirium precautions   # Acute CVA -Patient has prior history of CVA with residual left sided weakness -MRI positive for acute to subacute CVA Continue aspirin and Brilinta   Continue statin LDL 37, way below goal  <70, TSH 1.5 WNL -Neurology input is highly appreciated.   # Seizure disorder, patient is on Keppra EEG shows chronic seizures Continue Keppra 750 mg twice daily and additional dose after hemodialysis Continue seizure precautions   # Pneumonia due to COVID and RSV along with possible bacterial superinfection Continue ceftriaxone  azithromycin Continue steroids tapering dose PPI for GI prophylaxis Continue Breo Ellipta inhaler and DuoNeb every 6 hourly Mucinex 600 mg p.o. twice daily Procalcitonin 0.86 WBC count within normal range   # ESRD on hemodialysis, MWF schedule Continue hemodialysis Monday, Wednesday and Friday.  # Diabetes mellitus type 2 -Continue NovoLog sliding scale  DVT Prophylaxis: Heparin subcu  Advance goals of care discussion: Full code  Family Communication: Husband.  Disposition:  Pt is from SNF, admitted with acute stroke and pneumonia due to COVID and RSV, which precludes a safe discharge. Discharge to SNF, when stable, may need 1 to 2 days to improve.  Subjective: -Patient is a poor historian. -No new complaints. -Patient continues to report atypical chest pain and left shoulder blade pain.   -Confusion seems to be improving.  Physical Exam: General: Patient is awake and alert.  Patient is not in any distress.   HEENT: PATIENT IS PALE.  NO JAUNDICE.  ERRLA Neck: no JVD,  Cardiovascular: S1 and S2   Lungs: Adequate air entry.   Abdomen: Soft and nontender.   Neurologic: Left-sided residual weakness due to prior CVA.   Vitals:   11/18/23 0505 11/18/23 0510 11/18/23 0600 11/18/23 1202  BP:    (!) 154/96  Pulse: 92 92 95 97  Resp: (!) 23 20 20  (!) 23  Temp:    98.7 F (37.1 C)  TempSrc:    Oral  SpO2: 97% 97% 96% 97%  Weight:      Height:        Intake/Output Summary (Last 24 hours) at 11/18/2023 1419 Last data filed at 11/18/2023 0600 Gross per 24 hour  Intake 810 ml  Output 3500 ml  Net -2690 ml   Filed Weights   11/17/23 1900 11/18/23 0500  Weight: 79.5 kg 79.5 kg    Data Reviewed: I have personally reviewed and interpreted daily labs, tele strips, imagings as discussed above. I reviewed all nursing notes, pharmacy notes, vitals, pertinent old records I have discussed plan of care as described above with RN and patient/family.  CBC: Recent Labs  Lab  11/16/23 1044 11/16/23 1657 11/16/23 2115 11/17/23 0012 11/17/23 0349 11/18/23 0205  WBC 9.0  --   --   --  8.0 9.0  HGB 9.9* 8.8* 11.2* 9.9* 10.3* 10.5*  HCT 29.3* 26.0* 33.0* 29.0* 30.3* 30.9*  MCV 87.7  --   --   --  87.6 87.3  PLT 284  --   --   --  301 294   Basic Metabolic Panel: Recent Labs  Lab 11/16/23 1044 11/16/23 1657 11/16/23 2115 11/17/23 0012 11/17/23 0349 11/17/23 1200 11/18/23 0205  NA 136 137 136 137 139  --  136  K 3.3* 5.8* 3.4* 5.2* 4.1  --  3.7  CL 93*  --   --   --  98  --  98  CO2 28  --   --   --  26  --  26  GLUCOSE 106*  --   --   --  117*  --  98  BUN 19  --   --   --  22  --  11  CREATININE 4.79*  --   --   --  5.68*  --  3.15*  CALCIUM 8.0*  --   --   --  7.9*  --  8.3*  MG  --   --   --   --   --   --  1.9  PHOS  --   --   --   --   --  4.7*  --     Studies: ECHOCARDIOGRAM LIMITED Result Date: 11/17/2023    ECHOCARDIOGRAM LIMITED REPORT   Patient Name:   Carolyn Russo [facility] Date of Exam: 11/17/2023 Medical Rec #:  [id number]      Height:       63.0 in Accession #:    [id number]     Weight:       175.3 lb Date of Birth:  Mar 03, 1951      BSA:          1.828 m Patient Age:    73 years       BP:           154/88 mmHg Patient Gender: F              HR:           93 bpm. Exam Location:  Inpatient Procedure: Limited Echo, Limited Color Doppler and Cardiac Doppler Indications:    stroke  History:        Patient has prior history of Echocardiogram examinations, most                 recent 10/25/2023. CAD, end stage renal disease; Risk                 Factors:Hypertension.  Sonographer:    Delcie Roch RDCS Referring Phys: [id number] GOEL IMPRESSIONS  1. Left ventricular ejection fraction, by estimation, is 55 to 60%. The left ventricle has normal function. The left ventricle has no regional wall motion abnormalities. Elevated left ventricular end-diastolic pressure.  2. Right ventricular systolic function is  normal. The right ventricular size is normal. There  is severely elevated pulmonary artery systolic pressure.  3. The mitral valve is normal in structure. Trivial mitral valve regurgitation. No evidence of mitral stenosis.  4. The aortic valve is tricuspid. There is mild calcification of the aortic valve. Aortic valve regurgitation is not visualized. No aortic stenosis is present. Aortic valve mean gradient measures 11.0 mmHg. Aortic valve Vmax measures 2.16 m/s.  5. The inferior vena cava is dilated in size with <50% respiratory variability, suggesting right atrial pressure of 15 mmHg. FINDINGS  Left Ventricle: Left ventricular ejection fraction, by estimation, is 55 to 60%. The left ventricle has normal function. The left ventricle has no regional wall motion abnormalities. The left ventricular internal cavity size was normal in size. There is  no left ventricular hypertrophy. Elevated left ventricular end-diastolic pressure. Right Ventricle: The right ventricular size is normal. No increase in right ventricular wall thickness. Right ventricular systolic function is normal. There is severely elevated pulmonary artery systolic pressure. The tricuspid regurgitant velocity is 3.47 m/s, and with an assumed right atrial pressure of 15 mmHg, the estimated right ventricular systolic pressure is 63.2 mmHg. Left Atrium: Left atrial size was normal in size. Right Atrium: Right atrial size was normal in size. Pericardium: There is no evidence of pericardial effusion. Mitral Valve: The mitral valve is normal in structure. Mild mitral annular calcification. Trivial mitral valve regurgitation. No evidence of mitral valve stenosis. Tricuspid Valve: The tricuspid valve is normal in structure. Tricuspid valve regurgitation is mild . No evidence of tricuspid stenosis. Aortic Valve: The aortic valve is tricuspid. There is mild calcification of the aortic valve. Aortic valve regurgitation is not visualized. No aortic stenosis is present. Aortic valve mean gradient measures 11.0 mmHg.  Aortic valve peak gradient measures 18.7 mmHg. Pulmonic Valve: The pulmonic valve was normal in structure. Pulmonic valve regurgitation is not visualized. No evidence of pulmonic stenosis. Aorta: The aortic root is normal in size and structure. Venous: The inferior vena cava is dilated in size with less than 50% respiratory variability, suggesting right atrial pressure of 15 mmHg. IAS/Shunts: No atrial level shunt detected by color flow Doppler. Additional Comments: Spectral Doppler performed. Color Doppler performed.   Diastology LV e' medial:    5.55 cm/s LV E/e' medial:  22.2 LV e' lateral:   8.92 cm/s LV E/e' lateral: 13.8  IVC IVC diam: 2.40 cm AORTIC VALVE AV Vmax:           216.00 cm/s AV Vmean:          152.000 cm/s AV VTI:            0.407 m AV Peak Grad:      18.7 mmHg AV Mean Grad:      11.0 mmHg LVOT Vmax:         110.00 cm/s LVOT Vmean:        73.700 cm/s LVOT VTI:          0.210 m LVOT/AV VTI ratio: 0.52  AORTA Ao Asc diam: 2.80 cm MITRAL VALVE                TRICUSPID VALVE MV Area (PHT): 4.80 cm     TR Peak grad:   48.2 mmHg MV Decel Time: 158 msec     TR Vmax:        347.00 cm/s MV E velocity: 123.00 cm/s MV A velocity: 119.00 cm/s  SHUNTS MV E/A ratio:  1.03  Systemic VTI: 0.21 m Chilton Si MD Electronically signed by Chilton Si MD Signature Date/Time: 11/17/2023/6:14:59 PM    Final    MR ANGIO HEAD WO CONTRAST Result Date: 11/17/2023 CLINICAL DATA:  Neuro deficit, acute, stroke suspected. Right PICA infarct and abnormal appearance of the distal right vertebral artery on MRI. EXAM: MRA HEAD WITHOUT CONTRAST TECHNIQUE: Angiographic images of the Circle of Willis were acquired using MRA technique without intravenous contrast. COMPARISON:  Head MRI 11/16/2023. Head and neck CTA 06/22/2022. Head MRA 06/03/2022. FINDINGS: The study is motion degraded, moderately so towards the skull base Anterior circulation: The internal carotid arteries are widely patent from skull base to  carotid termini. ACAs and MCAs are patent without evidence of a proximal branch occlusion or significant A1 or M1 stenosis. No aneurysm is identified. Posterior circulation: The included portion of the intracranial left vertebral artery is widely patent and dominant. The included intracranial right vertebral artery is also patent but is diffusely small distally which reflects a change from the prior CTA. Patent AICA and SCA origins are visualized bilaterally. The basilar artery is patent with decreased signal through its mid to distal portions attributed to motion artifact based on source images. There are large posterior communicating arteries bilaterally. The PCAs are patent with severe bilateral P2 stenoses, likely progressed. No aneurysm is identified. Anatomic variants: None. IMPRESSION: 1. Motion degraded examination. 2. No large vessel occlusion. 3. Severe bilateral P2 stenoses. 4. Patent but diffusely narrowed distal right vertebral artery. Electronically Signed   By: Sebastian Ache M.D.   On: 11/17/2023 17:09    Scheduled Meds:   stroke: early stages of recovery book   Does not apply Once   [START ON 11/19/2023] aspirin EC  81 mg Oral Daily   bisacodyl  10 mg Oral QHS   Chlorhexidine Gluconate Cloth  6 each Topical Q0600   fluticasone furoate-vilanterol  1 puff Inhalation Daily   guaiFENesin  600 mg Oral BID   heparin injection (subcutaneous)  5,000 Units Subcutaneous Q8H   insulin aspart  0-5 Units Subcutaneous QHS   insulin aspart  0-6 Units Subcutaneous TID WC   ipratropium-albuterol  3 mL Nebulization Q6H   methylPREDNISolone (SOLU-MEDROL) injection  40 mg Intravenous Q24H   pantoprazole (PROTONIX) IV  40 mg Intravenous Q24H   phenylephrine  1 suppository Rectal BID   polyethylene glycol  17 g Oral BID   rosuvastatin  20 mg Oral QHS   sevelamer carbonate  800 mg Oral TID WC   sodium chloride flush  3 mL Intravenous Q12H   ticagrelor  90 mg Oral BID   Continuous Infusions:   azithromycin Stopped (11/17/23 2333)   cefTRIAXone (ROCEPHIN)  IV Stopped (11/17/23 2259)   levETIRAcetam 380 mg (11/17/23 1724)   levETIRAcetam Stopped (11/18/23 0154)   PRN Meds: acetaminophen **OR** acetaminophen, bisacodyl, chlorpheniramine-HYDROcodone, hydrALAZINE, labetalol, nitroGLYCERIN, phenylephrine **FOLLOWED BY** [START ON 11/19/2023] phenylephrine, SMOG, witch hazel-glycerin  Time spent: 55 minutes  Author: Barnetta Chapel, MD.   [facility] 11/18/2023 2:19 PM  To reach On-call, see care teams to locate the attending and reach out to them via [email or web address]. If 7PM-7AM, please contact night-coverage If you still have difficulty reaching the attending provider, please page the [facility] (Director on Call) for [facility] on amion for assistance.

## 2023-11-18 NOTE — Progress Notes (Addendum)
STROKE TEAM PROGRESS NOTE   BRIEF HPI [name] is a 73 y.o. female who presented from her SNF for chest pain, confusion and weakness.  CT head revealed infarct in the inferior right cerebellum which was new since 06/12/2022, MRI brain with acute right inferomedial cerebellar ischemic infarction in the right PICA territory.   SIGNIFICANT HOSPITAL EVENTS 1/24 CT head revealed infarct in the inferior right cerebellum which was new since 06/12/2022, MRI brain with acute right inferomedial cerebellar ischemic infarction in the right PICA territory.  In the ED she was found to have COVID/RSV  INTERIM HISTORY/SUBJECTIVE No family at the bedside.  Patient sitting in the chair in no apparent distress She states she is feeling better and has some pain This morning she is oriented, has right hemianopia and dysarthria Echo 55 to 60%  OBJECTIVE  CBC    Component Value Date/Time   WBC 9.0 11/18/2023 0205   RBC 3.54 (L) 11/18/2023 0205   HGB 10.5 (L) 11/18/2023 0205   HGB 9.3 (L) 04/13/2022 1512   HCT 30.9 (L) 11/18/2023 0205   HCT 27.6 (L) 04/13/2022 1512   PLT 294 11/18/2023 0205   PLT 341 04/13/2022 1512   MCV 87.3 11/18/2023 0205   MCV 89 04/13/2022 1512   MCH 29.7 11/18/2023 0205   MCHC 34.0 11/18/2023 0205   RDW 18.9 (H) 11/18/2023 0205   RDW 16.1 (H) 04/13/2022 1512   LYMPHSABS 3.4 06/08/2022 0340   LYMPHSABS 2.4 04/13/2022 1512   MONOABS 1.8 (H) 06/08/2022 0340   EOSABS 0.4 06/08/2022 0340   EOSABS 0.4 04/13/2022 1512   BASOSABS 0.1 06/08/2022 0340   BASOSABS 0.1 04/13/2022 1512    BMET    Component Value Date/Time   NA 136 11/18/2023 0205   NA 142 04/13/2022 1512   K 3.7 11/18/2023 0205   CL 98 11/18/2023 0205   CO2 26 11/18/2023 0205   GLUCOSE 98 11/18/2023 0205   BUN 11 11/18/2023 0205   BUN 12 04/13/2022 1512   CREATININE 3.15 (H) 11/18/2023 0205   CREATININE 0.84 10/13/2011 0903   CALCIUM 8.3 (L) 11/18/2023 0205   EGFR 12 (L) 04/13/2022 1512    GFRNONAA 15 (L) 11/18/2023 0205   GFRNONAA 75 10/13/2011 0903    IMAGING past 24 hours ECHOCARDIOGRAM LIMITED Result Date: 11/17/2023    ECHOCARDIOGRAM LIMITED REPORT   Patient Name:   Carolyn Russo [facility] Date of Exam: 11/17/2023 Medical Rec #:  [id number]      Height:       63.0 in Accession #:    [id number]     Weight:       175.3 lb Date of Birth:  09/29/1951      BSA:          1.828 m Patient Age:    73 years       BP:           154/88 mmHg Patient Gender: F              HR:           93 bpm. Exam Location:  Inpatient Procedure: Limited Echo, Limited Color Doppler and Cardiac Doppler Indications:    stroke  History:        Patient has prior history of Echocardiogram examinations, most                 recent 10/25/2023. CAD, end stage renal disease; Risk  Factors:Hypertension.  Sonographer:    Delcie Roch RDCS Referring Phys: [id number] Carolyn Russo IMPRESSIONS  1. Left ventricular ejection fraction, by estimation, is 55 to 60%. The left ventricle has normal function. The left ventricle has no regional wall motion abnormalities. Elevated left ventricular end-diastolic pressure.  2. Right ventricular systolic function is normal. The right ventricular size is normal. There is severely elevated pulmonary artery systolic pressure.  3. The mitral valve is normal in structure. Trivial mitral valve regurgitation. No evidence of mitral stenosis.  4. The aortic valve is tricuspid. There is mild calcification of the aortic valve. Aortic valve regurgitation is not visualized. No aortic stenosis is present. Aortic valve mean gradient measures 11.0 mmHg. Aortic valve Vmax measures 2.16 m/s.  5. The inferior vena cava is dilated in size with <50% respiratory variability, suggesting right atrial pressure of 15 mmHg. FINDINGS  Left Ventricle: Left ventricular ejection fraction, by estimation, is 55 to 60%. The left ventricle has normal function. The left ventricle has no regional wall motion abnormalities. The left  ventricular internal cavity size was normal in size. There is  no left ventricular hypertrophy. Elevated left ventricular end-diastolic pressure. Right Ventricle: The right ventricular size is normal. No increase in right ventricular wall thickness. Right ventricular systolic function is normal. There is severely elevated pulmonary artery systolic pressure. The tricuspid regurgitant velocity is 3.47 m/s, and with an assumed right atrial pressure of 15 mmHg, the estimated right ventricular systolic pressure is 63.2 mmHg. Left Atrium: Left atrial size was normal in size. Right Atrium: Right atrial size was normal in size. Pericardium: There is no evidence of pericardial effusion. Mitral Valve: The mitral valve is normal in structure. Mild mitral annular calcification. Trivial mitral valve regurgitation. No evidence of mitral valve stenosis. Tricuspid Valve: The tricuspid valve is normal in structure. Tricuspid valve regurgitation is mild . No evidence of tricuspid stenosis. Aortic Valve: The aortic valve is tricuspid. There is mild calcification of the aortic valve. Aortic valve regurgitation is not visualized. No aortic stenosis is present. Aortic valve mean gradient measures 11.0 mmHg. Aortic valve peak gradient measures 18.7 mmHg. Pulmonic Valve: The pulmonic valve was normal in structure. Pulmonic valve regurgitation is not visualized. No evidence of pulmonic stenosis. Aorta: The aortic root is normal in size and structure. Venous: The inferior vena cava is dilated in size with less than 50% respiratory variability, suggesting right atrial pressure of 15 mmHg. IAS/Shunts: No atrial level shunt detected by color flow Doppler. Additional Comments: Spectral Doppler performed. Color Doppler performed.   Diastology LV e' medial:    5.55 cm/s LV E/e' medial:  22.2 LV e' lateral:   8.92 cm/s LV E/e' lateral: 13.8  IVC IVC diam: 2.40 cm AORTIC VALVE AV Vmax:           216.00 cm/s AV Vmean:          152.000 cm/s AV VTI:             0.407 m AV Peak Grad:      18.7 mmHg AV Mean Grad:      11.0 mmHg LVOT Vmax:         110.00 cm/s LVOT Vmean:        73.700 cm/s LVOT VTI:          0.210 m LVOT/AV VTI ratio: 0.52  AORTA Ao Asc diam: 2.80 cm MITRAL VALVE                TRICUSPID VALVE MV Area (PHT):  4.80 cm     TR Peak grad:   48.2 mmHg MV Decel Time: 158 msec     TR Vmax:        347.00 cm/s MV E velocity: 123.00 cm/s MV A velocity: 119.00 cm/s  SHUNTS MV E/A ratio:  1.03         Systemic VTI: 0.21 m Chilton Si MD Electronically signed by Chilton Si MD Signature Date/Time: 11/17/2023/6:14:59 PM    Final    MR ANGIO HEAD WO CONTRAST Result Date: 11/17/2023 CLINICAL DATA:  Neuro deficit, acute, stroke suspected. Right PICA infarct and abnormal appearance of the distal right vertebral artery on MRI. EXAM: MRA HEAD WITHOUT CONTRAST TECHNIQUE: Angiographic images of the Circle of Willis were acquired using MRA technique without intravenous contrast. COMPARISON:  Head MRI 11/16/2023. Head and neck CTA 06/22/2022. Head MRA 06/03/2022. FINDINGS: The study is motion degraded, moderately so towards the skull base Anterior circulation: The internal carotid arteries are widely patent from skull base to carotid termini. ACAs and MCAs are patent without evidence of a proximal branch occlusion or significant A1 or M1 stenosis. No aneurysm is identified. Posterior circulation: The included portion of the intracranial left vertebral artery is widely patent and dominant. The included intracranial right vertebral artery is also patent but is diffusely small distally which reflects a change from the prior CTA. Patent AICA and SCA origins are visualized bilaterally. The basilar artery is patent with decreased signal through its mid to distal portions attributed to motion artifact based on source images. There are large posterior communicating arteries bilaterally. The PCAs are patent with severe bilateral P2 stenoses, likely progressed. No aneurysm  is identified. Anatomic variants: None. IMPRESSION: 1. Motion degraded examination. 2. No large vessel occlusion. 3. Severe bilateral P2 stenoses. 4. Patent but diffusely narrowed distal right vertebral artery. Electronically Signed   By: Sebastian Ache M.D.   On: 11/17/2023 17:09    Vitals:   11/18/23 0503 11/18/23 0505 11/18/23 0510 11/18/23 0600  BP:      Pulse: 96 92 92 95  Resp: 20 (!) 23 20 20   Temp:      TempSrc:      SpO2: 92% 97% 97% 96%  Weight:      Height:         PHYSICAL EXAM General: critically ill elderly African-American lady in no acute distress Psych:  Mood and affect appropriate for situation CV: Regular rate and rhythm on monitor Respiratory:  Regular, unlabored respirations on room air GI: Abdomen soft and nontender   NEURO:  Mental Status: Awake alert and oriented x 3 Speech/Language: speech is with mild dysarthria and mild aphasia.   Cranial Nerves:  II: PERRL. Visual fields full.  III, IV, VI: EOMI. Eyelids elevate symmetrically.  V: Sensation is intact to light touch and symmetrical to face.  VII: Face is symmetrical resting and smiling VIII: hearing intact to voice. IX, X: Palate elevates symmetrically. Phonation is normal.  MW:NUUVOZDG shrug 5/5. XII: tongue is midline without fasciculations. Motor: 5/5 strength in right upper and arm. Left upper and lower 4/5.  Right BKA Tone: Increased in left upper extremity with nonfixed flexion contractures of the left hand fingers Sensation- Intact to light touch bilaterally. Extinction absent to light touch to DSS.   Coordination: FTN intact bilaterally, HKS: no ataxia in BLE.No drift.  Gait- deferred   ASSESSMENT/PLAN  Acute Ischemic Infarct:  right PCA  Etiology:  likely embolic   CT head Infarct in the inferior right cerebellum  MRI  1.3 cm acute to early subacute right PICA distribution infarct  MRA slightly hypoplastic posterior circulation with mild narrowing of the terminal left vertebral and  distal basilar.   Carotid Doppler  ordered 2D Echo EF 55 to 60% Recommend 30 day heart monitor after discharge Will need neurology outpatient follow-up in 8 weeks after discharge LDL 37 HgbA1c 5.8 VTE prophylaxis - SCD aspirin 81 mg daily and Brilinta (ticagrelor) 90 mg bid prior to admission, now on aspirin 81 mg daily and Brilinta (ticagrelor) 90 mg bid  Therapy recommendations:  Pending Disposition:  pending  Hx of Stroke/TIA Stroke history  Migraine Hx of right BG in 2022 On keppra 750 mg at bedtime for focal epilepsy. 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  Additionally there is mild diffuse encephalopathy. No seizures were seen throughout the recording.    Hypertension CHF Home meds:  hydralazine 100mg , losartan 50mg ,  Stable Blood Pressure Goal: BP less than 220/110   Hyperlipidemia Home meds:  crestor 20mg ,  resumed in hospital LDL 37, goal < 70 Continue statin at discharge  Diabetes type II Controlled Home meds:  insulin HgbA1c 5.8, goal < 7.0 CBGs SSI Recommend close follow-up with PCP for better DM control  Dysphagia Patient has post-stroke dysphagia, SLP consulted    Diet   Diet Carb Modified Fluid consistency: Thin; Room service appropriate? Yes; Fluid restriction: 1500 mL Fluid   Advance diet as tolerated  Other Stroke Risk Factors Obesity, Body mass index is 31.05 kg/m., BMI >/= 30 associated with increased stroke risk, recommend weight loss, diet and exercise as appropriate  Congestive heart failure  Other Active Problems ESRD on HD  COVID/ RSV  Hospital day # 2  Gevena Mart DNP, ACNPC-AG  [facility]    To contact Stroke Continuity provider, please refer to [email or web address]. After hours, contact General Neurology

## 2023-11-18 NOTE — Plan of Care (Signed)

## 2023-11-18 NOTE — Progress Notes (Signed)
[facility] Progress Note   Subjective:   Patient seen in room, seated in chair. Reports she is tired. Complains of cough. Denies shortness of breath.   Objective Vitals:   11/18/23 0503 11/18/23 0505 11/18/23 0510 11/18/23 0600  BP:      Pulse: 96 92 92 95  Resp: 20 (!) 23 20 20   Temp:      TempSrc:      SpO2: 92% 97% 97% 96%  Weight:      Height:       Physical Exam General: Elderly female in NAD Heart: RRR, no murmurs, rubs or gallops Lungs: + rhonchi bilaterally, respirations unlabored on O2 via [place] Abdomen: Soft, non-distended, +BS Extremities: No edema b/l lower extremities Dialysis Access: R femoral TDC  Additional Objective Labs: Basic Metabolic Panel: Recent Labs  Lab 11/16/23 1044 11/16/23 1657 11/17/23 0012 11/17/23 0349 11/17/23 1200 11/18/23 0205  NA 136   < > 137 139  --  136  K 3.3*   < > 5.2* 4.1  --  3.7  CL 93*  --   --  98  --  98  CO2 28  --   --  26  --  26  GLUCOSE 106*  --   --  117*  --  98  BUN 19  --   --  22  --  11  CREATININE 4.79*  --   --  5.68*  --  3.15*  CALCIUM 8.0*  --   --  7.9*  --  8.3*  PHOS  --   --   --   --  4.7*  --    < > = values in this interval not displayed.   Liver Function Tests: Recent Labs  Lab 11/16/23 2200  AST 18  ALT 12  ALKPHOS 110  BILITOT 0.7  PROT 7.3  ALBUMIN 2.9*   No results for input(s): "LIPASE", "AMYLASE" in the last 168 hours. CBC: Recent Labs  Lab 11/16/23 1044 11/16/23 1657 11/17/23 0012 11/17/23 0349 11/18/23 0205  WBC 9.0  --   --  8.0 9.0  HGB 9.9*   < > 9.9* 10.3* 10.5*  HCT 29.3*   < > 29.0* 30.3* 30.9*  MCV 87.7  --   --  87.6 87.3  PLT 284  --   --  301 294   < > = values in this interval not displayed.   Blood Culture    Component Value Date/Time   SDES BLOOD SITE NOT SPECIFIED 11/16/2023 2248   SPECREQUEST  11/16/2023 2248    BOTTLES DRAWN AEROBIC AND ANAEROBIC Blood Culture results may not be optimal due to an inadequate volume of blood  received in culture bottles   CULT  11/16/2023 2248    NO GROWTH < 12 HOURS Performed at [facility], [address], [place], [place] [address]    REPTSTATUS PENDING 11/16/2023 2248    Cardiac Enzymes: No results for input(s): "CKTOTAL", "CKMB", "CKMBINDEX", "TROPONINI" in the last 168 hours. CBG: Recent Labs  Lab 11/17/23 0222 11/17/23 0744 11/17/23 1615 11/17/23 2122 11/18/23 0622  GLUCAP 96 126* 128* 159* 77   Iron Studies: No results for input(s): "IRON", "TIBC", "TRANSFERRIN", "FERRITIN" in the last 72 hours. @lablastinr3 @ Studies/Results: ECHOCARDIOGRAM LIMITED Result Date: 11/17/2023    ECHOCARDIOGRAM LIMITED REPORT   Patient Name:   Carolyn Russo [facility] Date of Exam: 11/17/2023 Medical Rec #:  [id number]      Height:  63.0 in Accession #:    [id number]     Weight:       175.3 lb Date of Birth:  [birth year]      BSA:          1.828 m Patient Age:    73 years       BP:           154/88 mmHg Patient Gender: F              HR:           93 bpm. Exam Location:  Inpatient Procedure: Limited Echo, Limited Color Doppler and Cardiac Doppler Indications:    stroke  History:        Patient has prior history of Echocardiogram examinations, most                 recent 10/25/2023. CAD, end stage renal disease; Risk                 Factors:Hypertension.  Sonographer:    Delcie Roch RDCS Referring Phys: [id number] [facility] GOEL IMPRESSIONS  1. Left ventricular ejection fraction, by estimation, is 55 to 60%. The left ventricle has normal function. The left ventricle has no regional wall motion abnormalities. Elevated left ventricular end-diastolic pressure.  2. Right ventricular systolic function is normal. The right ventricular size is normal. There is severely elevated pulmonary artery systolic pressure.  3. The mitral valve is normal in structure. Trivial mitral valve regurgitation. No evidence of mitral stenosis.  4. The aortic valve is tricuspid. There is mild calcification of the aortic valve.  Aortic valve regurgitation is not visualized. No aortic stenosis is present. Aortic valve mean gradient measures 11.0 mmHg. Aortic valve Vmax measures 2.16 m/s.  5. The inferior vena cava is dilated in size with <50% respiratory variability, suggesting right atrial pressure of 15 mmHg. FINDINGS  Left Ventricle: Left ventricular ejection fraction, by estimation, is 55 to 60%. The left ventricle has normal function. The left ventricle has no regional wall motion abnormalities. The left ventricular internal cavity size was normal in size. There is  no left ventricular hypertrophy. Elevated left ventricular end-diastolic pressure. Right Ventricle: The right ventricular size is normal. No increase in right ventricular wall thickness. Right ventricular systolic function is normal. There is severely elevated pulmonary artery systolic pressure. The tricuspid regurgitant velocity is 3.47 m/s, and with an assumed right atrial pressure of 15 mmHg, the estimated right ventricular systolic pressure is 63.2 mmHg. Left Atrium: Left atrial size was normal in size. Right Atrium: Right atrial size was normal in size. Pericardium: There is no evidence of pericardial effusion. Mitral Valve: The mitral valve is normal in structure. Mild mitral annular calcification. Trivial mitral valve regurgitation. No evidence of mitral valve stenosis. Tricuspid Valve: The tricuspid valve is normal in structure. Tricuspid valve regurgitation is mild . No evidence of tricuspid stenosis. Aortic Valve: The aortic valve is tricuspid. There is mild calcification of the aortic valve. Aortic valve regurgitation is not visualized. No aortic stenosis is present. Aortic valve mean gradient measures 11.0 mmHg. Aortic valve peak gradient measures 18.7 mmHg. Pulmonic Valve: The pulmonic valve was normal in structure. Pulmonic valve regurgitation is not visualized. No evidence of pulmonic stenosis. Aorta: The aortic root is normal in size and structure. Venous:  The inferior vena cava is dilated in size with less than 50% respiratory variability, suggesting right atrial pressure of 15 mmHg. IAS/Shunts: No atrial level shunt detected by color flow Doppler. Additional  Comments: Spectral Doppler performed. Color Doppler performed.   Diastology LV e' medial:    5.55 cm/s LV E/e' medial:  22.2 LV e' lateral:   8.92 cm/s LV E/e' lateral: 13.8  IVC IVC diam: 2.40 cm AORTIC VALVE AV Vmax:           216.00 cm/s AV Vmean:          152.000 cm/s AV VTI:            0.407 m AV Peak Grad:      18.7 mmHg AV Mean Grad:      11.0 mmHg LVOT Vmax:         110.00 cm/s LVOT Vmean:        73.700 cm/s LVOT VTI:          0.210 m LVOT/AV VTI ratio: 0.52  AORTA Ao Asc diam: 2.80 cm MITRAL VALVE                TRICUSPID VALVE MV Area (PHT): 4.80 cm     TR Peak grad:   48.2 mmHg MV Decel Time: 158 msec     TR Vmax:        347.00 cm/s MV E velocity: 123.00 cm/s MV A velocity: 119.00 cm/s  SHUNTS MV E/A ratio:  1.03         Systemic VTI: 0.21 m Chilton Si MD Electronically signed by Chilton Si MD Signature Date/Time: 11/17/2023/6:14:59 PM    Final    MR ANGIO HEAD WO CONTRAST Result Date: 11/17/2023 CLINICAL DATA:  Neuro deficit, acute, stroke suspected. Right PICA infarct and abnormal appearance of the distal right vertebral artery on MRI. EXAM: MRA HEAD WITHOUT CONTRAST TECHNIQUE: Angiographic images of the Circle of Willis were acquired using MRA technique without intravenous contrast. COMPARISON:  Head MRI 11/16/2023. Head and neck CTA 06/22/2022. Head MRA 06/03/2022. FINDINGS: The study is motion degraded, moderately so towards the skull base Anterior circulation: The internal carotid arteries are widely patent from skull base to carotid termini. ACAs and MCAs are patent without evidence of a proximal branch occlusion or significant A1 or M1 stenosis. No aneurysm is identified. Posterior circulation: The included portion of the intracranial left vertebral artery is widely patent and  dominant. The included intracranial right vertebral artery is also patent but is diffusely small distally which reflects a change from the prior CTA. Patent AICA and SCA origins are visualized bilaterally. The basilar artery is patent with decreased signal through its mid to distal portions attributed to motion artifact based on source images. There are large posterior communicating arteries bilaterally. The PCAs are patent with severe bilateral P2 stenoses, likely progressed. No aneurysm is identified. Anatomic variants: None. IMPRESSION: 1. Motion degraded examination. 2. No large vessel occlusion. 3. Severe bilateral P2 stenoses. 4. Patent but diffusely narrowed distal right vertebral artery. Electronically Signed   By: Sebastian Ache M.D.   On: 11/17/2023 17:09   EEG adult Result Date: 11/17/2023 Charlsie Quest, MD     11/17/2023 10:44 AM Patient Name: LACYE MCCARN MRN: [id number] Epilepsy Attending: Charlsie Quest Referring Physician/Provider: Caryl Pina, MD Date: 11/17/2023 Duration: 23.09 mins Patient history: 73 year old female with history of seizures who presented with altered mental status.  EEG to evaluate for seizure. Level of alertness: Awake AEDs during EEG study: LEV Technical aspects: This EEG study was done with scalp electrodes positioned according to the 10-20 International system of electrode placement. Electrical activity was reviewed with band pass filter of 1-70Hz , sensitivity  of 7 uV/mm, display speed of 105mm/sec with a 60Hz  notched filter applied as appropriate. EEG data were recorded continuously and digitally stored.  Video monitoring was available and reviewed as appropriate. Description: The posterior dominant rhythm consists of 8 Hz activity of moderate voltage (25-35 uV) seen predominantly in posterior head regions, symmetric and reactive to eye opening and eye closing. EEG showed intermittent generalized 3 to 6 Hz theta-delta slowing.spikes were noted in right temporoparietal  region. Hyperventilation and photic stimulation were not performed.   Of note, study was technically difficult due to significant movement artifact. 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  Additionally there is mild diffuse encephalopathy. No seizures were seen throughout the recording. Charlsie Quest   MR BRAIN WO CONTRAST Result Date: 11/17/2023 CLINICAL DATA:  Initial evaluation for acute TIA. EXAM: MRI HEAD WITHOUT CONTRAST TECHNIQUE: Multiplanar, multiecho pulse sequences of the brain and surrounding structures were obtained without intravenous contrast. COMPARISON:  Prior CT from earlier the same day as well as prior CTA from 06/22/2022. FINDINGS: Brain: Examination degraded by motion artifact. Diffuse prominence of the CSF containing spaces compatible with atrophy, most pronounced about the frontal lobes bilaterally. Patchy and confluent T2/FLAIR hyperintensity involving the periventricular deep white matter both cerebral hemispheres, consistent with chronic small vessel ischemic disease, moderate to advanced in nature. Patchy involvement of the pons noted. Remote lacunar infarct present at the right basal ganglia. Associated wallerian degeneration noted. Remote lacunar infarct noted within the left cerebellum as well. A 1.3 cm focus of diffusion signal abnormality involving the inferior right cerebellum, consistent with an acute to early subacute right PICA distribution infarct. No associated hemorrhage or significant mass effect. No other evidence for acute or subacute ischemia. No acute intracranial hemorrhage. Few scattered chronic micro hemorrhages noted, likely hypertensive in nature. No mass lesion, midline shift or mass effect. Mild ventricular prominence related to global parenchymal volume loss without hydrocephalus. No extra-axial  fluid collection. Pituitary gland suprasellar region within normal limits. Vascular: Loss of normal flow void within the intradural right V4 segment, which could reflect slow flow and/or occlusion. This appears to be new from prior CTA from 06/22/2022. Major intracranial vascular flow voids are otherwise maintained. Skull and upper cervical spine: Craniocervical junction within normal limits. Decreased T1 signal intensity noted within the visualized bone marrow, nonspecific, but most commonly related to anemia, smoking or obesity. No scalp soft tissue abnormality. Sinuses/Orbits: Bilateral ocular lens replacement with axial myopia. Scattered mucosal thickening noted about the paranasal sinuses. Superimposed air-fluid level within the right maxillary sinus. Small to moderate bilateral mastoid effusions. Negative nasopharynx. Other: None. IMPRESSION: 1. 1.3 cm acute to early subacute right PICA distribution infarct. No associated hemorrhage or significant mass effect. 2. Loss of normal flow void within the intradural right V4 segment, which could reflect slow flow and/or occlusion. This appears to be new from prior CTA from 06/22/2022. 3. Underlying moderate to advanced chronic microvascular ischemic disease with remote lacunar infarcts involving the right basal ganglia and left cerebellum. 4. Underlying atrophy, most pronounced about the frontal lobes bilaterally. 5. Paranasal sinus disease with air-fluid level within the right maxillary sinus. Clinical correlation for possible acute sinusitis recommended. Electronically Signed   By: Rise Mu M.D.   On: 11/17/2023 00:40   DG Chest Portable 1 View Result Date: 11/17/2023 CLINICAL DATA:  Altered mental status EXAM: PORTABLE CHEST 1 VIEW COMPARISON:  11/16/2023 FINDINGS:  Right dialysis catheter is unchanged. Cardiomegaly with vascular congestion and mild interstitial and airspace opacities compatible with edema. No effusions or acute bony abnormality.  IMPRESSION: Cardiomegaly, vascular congestion.  Mild pulmonary edema. Electronically Signed   By: Charlett Nose M.D.   On: 11/17/2023 00:26   CT Angio Chest PE W and/or Wo Contrast Result Date: 11/16/2023 CLINICAL DATA:  Chest pain, Pulmonary embolism (PE) suspected, high prob EXAM: CT ANGIOGRAPHY CHEST WITH CONTRAST TECHNIQUE: Multidetector CT imaging of the chest was performed using the standard protocol during bolus administration of intravenous contrast. Multiplanar CT image reconstructions and MIPs were obtained to evaluate the vascular anatomy. RADIATION DOSE REDUCTION: This exam was performed according to the departmental dose-optimization program which includes automated exposure control, adjustment of the mA and/or kV according to patient size and/or use of iterative reconstruction technique. CONTRAST:  75mL OMNIPAQUE IOHEXOL 350 MG/ML SOLN COMPARISON:  11/16/2023, 10/24/2023 FINDINGS: Cardiovascular: This is a technically adequate evaluation of the pulmonary vasculature. No filling defects or pulmonary emboli. The heart is enlarged without pericardial effusion. No evidence of thoracic aortic aneurysm or dissection. Stable aortic valve calcification, aortic atherosclerosis, and atherosclerosis throughout the coronary vasculature. Right internal jugular dialysis catheter tip within the atriocaval junction. Mediastinum/Nodes: Stable right hilar adenopathy measuring up to 11 mm in short axis. Thyroid, trachea, and esophagus are unremarkable. Small hiatal hernia. Lungs/Pleura: There are dependent areas of consolidation within the lungs bilaterally, favoring hypoventilatory changes over airspace disease. Trace bilateral pleural fluid. No pneumothorax. The central airways are patent. Upper Abdomen: No acute abnormality. Musculoskeletal: No acute or destructive bony abnormalities. Reconstructed images demonstrate no additional findings. Review of the MIP images confirms the above findings. IMPRESSION: 1. No  evidence of pulmonary embolus. 2. Dependent areas of consolidation within the lungs bilaterally, favor hypoventilatory changes over airspace disease. 3. Trace bilateral pleural effusions. 4. Aortic Atherosclerosis (ICD10-I70.0). Coronary artery atherosclerosis. Electronically Signed   By: Sharlet Salina M.D.   On: 11/16/2023 19:07   CT HEAD WO CONTRAST ( ) Result Date: 11/16/2023 CLINICAL DATA:  Head trauma, minor (Age >= 65y) EXAM: CT HEAD WITHOUT CONTRAST TECHNIQUE: Contiguous axial images were obtained from the base of the skull through the vertex without intravenous contrast. RADIATION DOSE REDUCTION: This exam was performed according to the departmental dose-optimization program which includes automated exposure control, adjustment of the mA and/or kV according to patient size and/or use of iterative reconstruction technique. COMPARISON:  CT head 06/22/2022. FINDINGS: Brain: Infarct in the inferior right cerebellum which is new since the 06/12/2022. No evidence of acute hemorrhage, hydrocephalus, extra-axial collection or mass lesion/mass effect. Similar patchy white matter hypodensities, nonspecific but compatible with chronic microvascular ischemic disease. Similar remote perforator infarct in the right basal ganglia. Vascular: Calcific atherosclerosis.  No hyperdense vessel. Skull: No acute fracture. Sinuses/Orbits: Moderate right maxillary sinus mucosal thickening. No acute orbital findings. Other: No mastoid effusions. IMPRESSION: 1. Infarct in the inferior right cerebellum which is new since 06/12/2022 but otherwise age indeterminate. If there is concern for acute infarct, recommend MRI head. 2. No acute hemorrhage. 3. Similar chronic microvascular ischemic changes. Electronically Signed   By: Feliberto Harts M.D.   On: 11/16/2023 19:06   DG Shoulder Left Result Date: 11/16/2023 CLINICAL DATA:  Left arm pain status post fall. EXAM: LEFT SHOULDER - 2+ VIEW COMPARISON:  None Available. FINDINGS:  There is no evidence of acute fracture or dislocation. Mild degenerative changes of the glenohumeral and acromioclavicular joints. IMPRESSION: No acute osseous abnormality. Electronically Signed   By: Criss Rosales  Lateef M.D.   On: 11/16/2023 14:54   DG Chest 1 View Result Date: 11/16/2023 CLINICAL DATA:  Chest pain. EXAM: CHEST  1 VIEW COMPARISON:  Radiographs 10/24/2023 and 02/12/2023.  CT 10/24/2023. FINDINGS: 1157 hours. Right IJ hemodialysis catheter projects over the lower right atrium. Stable cardiomegaly and vascular congestion. There is chronic scarring or atelectasis at the left lung base. No edema, confluent airspace disease, significant pleural effusion or pneumothorax. The bones appear unchanged. Telemetry leads overlie the chest. IMPRESSION: Stable cardiomegaly and vascular congestion. No evidence of acute cardiopulmonary process. Electronically Signed   By: Carey Bullocks M.D.   On: 11/16/2023 12:14   Medications:  azithromycin Stopped (11/17/23 2333)   cefTRIAXone (ROCEPHIN)  IV Stopped (11/17/23 2259)   levETIRAcetam 380 mg (11/17/23 1724)   levETIRAcetam Stopped (11/18/23 0154)     stroke: early stages of recovery book   Does not apply Once   aspirin  300 mg Rectal Daily   Or   aspirin  325 mg Oral Daily   bisacodyl  10 mg Oral QHS   Chlorhexidine Gluconate Cloth  6 each Topical Q0600   fluticasone furoate-vilanterol  1 puff Inhalation Daily   guaiFENesin  600 mg Oral BID   heparin injection (subcutaneous)  5,000 Units Subcutaneous Q8H   heparin sodium (porcine)       insulin aspart  0-5 Units Subcutaneous QHS   insulin aspart  0-6 Units Subcutaneous TID WC   ipratropium-albuterol  3 mL Nebulization Q4H   methylPREDNISolone (SOLU-MEDROL) injection  40 mg Intravenous Q24H   pantoprazole (PROTONIX) IV  40 mg Intravenous Q24H   phenylephrine  1 suppository Rectal BID   polyethylene glycol  17 g Oral BID   rosuvastatin  20 mg Oral QHS   sevelamer carbonate  800 mg Oral TID WC    sodium chloride flush  3 mL Intravenous Q12H   ticagrelor  90 mg Oral BID    Dialysis Orders: MWF [facility]  4h   B350    75.5kg   2/2 bath   R fem TDC  Heparin 2600 + 1400 midrun - last HD 1/22, getting to dry wt - 3-4 drops in dry wt in last 3 wks - venofer 100 qhd thru 1/31 - mircera 150 q2, last 1/17, due 1/31  Assessment/Plan: AHRF/ CAP - started on IV abx and supp O2. Imaging suggest vol overload as well. Had HD yesterday with 3.5L UF AMS - pt was lethargic, possible new CVA by imaging. Seen by neurology - altered MS could be related to hypoxia, TME, post-ictal.  ESRD - on HD MWF. HD Monday.  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 Esa due on 1/31. Follow.  Secondary hyperparathyroidism - CCa in range, add on phos. Cont binders w/ meals.  H/o seizure d/o - on Keppra    Rogers Blocker, Cordelia Poche 11/18/2023, 8:32 AM  [facility] Pager: [phone]

## 2023-11-18 NOTE — Plan of Care (Signed)
Problem: Education: Goal: Ability to describe self-care measures that may prevent or decrease complications (Diabetes Survival Skills Education) will improve 11/18/2023 0108 by Brooke Bonito, RN Outcome: Progressing 11/17/2023 2031 by Brooke Bonito, RN Outcome: Progressing Goal: Individualized Educational Video(s) 11/18/2023 0108 by Brooke Bonito, RN Outcome: Progressing 11/17/2023 2031 by Brooke Bonito, RN Outcome: Progressing   Problem: Coping: Goal: Ability to adjust to condition or change in health will improve 11/18/2023 0108 by Brooke Bonito, RN Outcome: Progressing 11/17/2023 2031 by Brooke Bonito, RN Outcome: Progressing   Problem: Fluid Volume: Goal: Ability to maintain a balanced intake and output will improve 11/18/2023 0108 by Brooke Bonito, RN Outcome: Progressing 11/17/2023 2031 by Brooke Bonito, RN Outcome: Progressing   Problem: Health Behavior/Discharge Planning: Goal: Ability to identify and utilize available resources and services will improve 11/18/2023 0108 by Brooke Bonito, RN Outcome: Progressing 11/17/2023 2031 by Brooke Bonito, RN Outcome: Progressing Goal: Ability to manage health-related needs will improve 11/18/2023 0108 by Brooke Bonito, RN Outcome: Progressing 11/17/2023 2031 by Brooke Bonito, RN Outcome: Progressing   Problem: Metabolic: Goal: Ability to maintain appropriate glucose levels will improve 11/18/2023 0108 by Brooke Bonito, RN Outcome: Progressing 11/17/2023 2031 by Brooke Bonito, RN Outcome: Progressing   Problem: Nutritional: Goal: Maintenance of adequate nutrition will improve 11/18/2023 0108 by Brooke Bonito, RN Outcome: Progressing 11/17/2023 2031 by Brooke Bonito, RN Outcome: Progressing Goal: Progress toward achieving an optimal weight will improve 11/18/2023 0108 by Brooke Bonito, RN Outcome: Progressing 11/17/2023 2031 by Brooke Bonito, RN Outcome: Progressing   Problem:  Skin Integrity: Goal: Risk for impaired skin integrity will decrease 11/18/2023 0108 by Brooke Bonito, RN Outcome: Progressing 11/17/2023 2031 by Brooke Bonito, RN Outcome: Progressing   Problem: Tissue Perfusion: Goal: Adequacy of tissue perfusion will improve 11/18/2023 0108 by Brooke Bonito, RN Outcome: Progressing 11/17/2023 2031 by Brooke Bonito, RN Outcome: Progressing   Problem: Education: Goal: Knowledge of disease or condition will improve 11/18/2023 0108 by Brooke Bonito, RN Outcome: Progressing 11/17/2023 2031 by Brooke Bonito, RN Outcome: Progressing Goal: Knowledge of secondary prevention will improve (MUST DOCUMENT ALL) 11/18/2023 0108 by Brooke Bonito, RN Outcome: Progressing 11/17/2023 2031 by Brooke Bonito, RN Outcome: Progressing Goal: Knowledge of patient specific risk factors will improve (DELETE if not current risk factor) 11/18/2023 0108 by Brooke Bonito, RN Outcome: Progressing 11/17/2023 2031 by Brooke Bonito, RN Outcome: Progressing   Problem: Ischemic Stroke/TIA Tissue Perfusion: Goal: Complications of ischemic stroke/TIA will be minimized 11/18/2023 0108 by Brooke Bonito, RN Outcome: Progressing 11/17/2023 2031 by Brooke Bonito, RN Outcome: Progressing   Problem: Coping: Goal: Will verbalize positive feelings about self 11/18/2023 0108 by Brooke Bonito, RN Outcome: Progressing 11/17/2023 2031 by Brooke Bonito, RN Outcome: Progressing Goal: Will identify appropriate support needs 11/18/2023 0108 by Brooke Bonito, RN Outcome: Progressing 11/17/2023 2031 by Brooke Bonito, RN Outcome: Progressing   Problem: Health Behavior/Discharge Planning: Goal: Ability to manage health-related needs will improve 11/18/2023 0108 by Brooke Bonito, RN Outcome: Progressing 11/17/2023 2031 by Brooke Bonito, RN Outcome: Progressing Goal: Goals will be collaboratively established with patient/family 11/18/2023 0108 by  Brooke Bonito, RN Outcome: Progressing 11/17/2023 2031 by Brooke Bonito, RN Outcome: Progressing   Problem: Self-Care: Goal: Ability to participate in self-care as condition permits will improve 11/18/2023 0108 by Brooke Bonito, RN Outcome: Progressing 11/17/2023  2031 by Brooke Bonito, RN Outcome: Progressing Goal: Verbalization of feelings and concerns over difficulty with self-care will improve 11/18/2023 0108 by Brooke Bonito, RN Outcome: Progressing 11/17/2023 2031 by Brooke Bonito, RN Outcome: Progressing Goal: Ability to communicate needs accurately will improve 11/18/2023 0108 by Brooke Bonito, RN Outcome: Progressing 11/17/2023 2031 by Brooke Bonito, RN Outcome: Progressing   Problem: Nutrition: Goal: Risk of aspiration will decrease 11/18/2023 0108 by Brooke Bonito, RN Outcome: Progressing 11/17/2023 2031 by Brooke Bonito, RN Outcome: Progressing Goal: Dietary intake will improve 11/18/2023 0108 by Brooke Bonito, RN Outcome: Progressing 11/17/2023 2031 by Brooke Bonito, RN Outcome: Progressing   Problem: Education: Goal: Knowledge of risk factors and measures for prevention of condition will improve 11/18/2023 0108 by Brooke Bonito, RN Outcome: Progressing 11/17/2023 2031 by Brooke Bonito, RN Outcome: Progressing   Problem: Coping: Goal: Psychosocial and spiritual needs will be supported 11/18/2023 0108 by Brooke Bonito, RN Outcome: Progressing 11/17/2023 2031 by Brooke Bonito, RN Outcome: Progressing   Problem: Respiratory: Goal: Will maintain a patent airway 11/18/2023 0108 by Brooke Bonito, RN Outcome: Progressing 11/17/2023 2031 by Brooke Bonito, RN Outcome: Progressing Goal: Complications related to the disease process, condition or treatment will be avoided or minimized 11/18/2023 0108 by Brooke Bonito, RN Outcome: Progressing 11/17/2023 2031 by Brooke Bonito, RN Outcome: Progressing   Problem:  Education: Goal: Knowledge of General Education information will improve Description: Including pain rating scale, medication(s)/side effects and non-pharmacologic comfort measures 11/18/2023 0108 by Brooke Bonito, RN Outcome: Progressing 11/17/2023 2031 by Brooke Bonito, RN Outcome: Progressing   Problem: Health Behavior/Discharge Planning: Goal: Ability to manage health-related needs will improve 11/18/2023 0108 by Brooke Bonito, RN Outcome: Progressing 11/17/2023 2031 by Brooke Bonito, RN Outcome: Progressing   Problem: Clinical Measurements: Goal: Ability to maintain clinical measurements within normal limits will improve 11/18/2023 0108 by Brooke Bonito, RN Outcome: Progressing 11/17/2023 2031 by Brooke Bonito, RN Outcome: Progressing Goal: Will remain free from infection 11/18/2023 0108 by Brooke Bonito, RN Outcome: Progressing 11/17/2023 2031 by Brooke Bonito, RN Outcome: Progressing Goal: Diagnostic test results will improve 11/18/2023 0108 by Brooke Bonito, RN Outcome: Progressing 11/17/2023 2031 by Brooke Bonito, RN Outcome: Progressing Goal: Respiratory complications will improve 11/18/2023 0108 by Brooke Bonito, RN Outcome: Progressing 11/17/2023 2031 by Brooke Bonito, RN Outcome: Progressing Goal: Cardiovascular complication will be avoided 11/18/2023 0108 by Brooke Bonito, RN Outcome: Progressing 11/17/2023 2031 by Brooke Bonito, RN Outcome: Progressing   Problem: Activity: Goal: Risk for activity intolerance will decrease 11/18/2023 0108 by Brooke Bonito, RN Outcome: Progressing 11/17/2023 2031 by Brooke Bonito, RN Outcome: Progressing   Problem: Nutrition: Goal: Adequate nutrition will be maintained 11/18/2023 0108 by Brooke Bonito, RN Outcome: Progressing 11/17/2023 2031 by Brooke Bonito, RN Outcome: Progressing   Problem: Coping: Goal: Level of anxiety will decrease 11/18/2023 0108 by Brooke Bonito,  RN Outcome: Progressing 11/17/2023 2031 by Brooke Bonito, RN Outcome: Progressing   Problem: Elimination: Goal: Will not experience complications related to bowel motility 11/18/2023 0108 by Brooke Bonito, RN Outcome: Progressing 11/17/2023 2031 by Brooke Bonito, RN Outcome: Progressing Goal: Will not experience complications related to urinary retention 11/18/2023 0108 by Brooke Bonito, RN Outcome: Progressing 11/17/2023 2031 by Brooke Bonito, RN Outcome: Progressing   Problem: Pain Managment: Goal: General experience of comfort will improve  and/or be controlled 11/18/2023 0108 by Brooke Bonito, RN Outcome: Progressing 11/17/2023 2031 by Brooke Bonito, RN Outcome: Progressing   Problem: Safety: Goal: Ability to remain free from injury will improve 11/18/2023 0108 by Brooke Bonito, RN Outcome: Progressing 11/17/2023 2031 by Brooke Bonito, RN Outcome: Progressing   Problem: Skin Integrity: Goal: Risk for impaired skin integrity will decrease 11/18/2023 0108 by Brooke Bonito, RN Outcome: Progressing 11/17/2023 2031 by Brooke Bonito, RN Outcome: Progressing

## 2023-11-18 NOTE — Progress Notes (Signed)
Patient presented with mild flat troponin elevation (205>199) in setting of COVID/RSV infection.  Also found to have acute CVA.  Suspected to be demand ischemia.  Echocardiogram was done yesterday and shows normal LV systolic function.  No further cardiac workup recommended at this time.  Plan outpatient follow-up, could consider stress PET once recovers from acute illness.  Cardiology will sign off at this time, please call if any questions arise  Little Ishikawa, MD

## 2023-11-19 ENCOUNTER — Inpatient Hospital Stay (HOSPITAL_COMMUNITY): Payer: Medicare PPO

## 2023-11-19 DIAGNOSIS — I639 Cerebral infarction, unspecified: Secondary | ICD-10-CM

## 2023-11-19 DIAGNOSIS — G9340 Encephalopathy, unspecified: Secondary | ICD-10-CM | POA: Diagnosis not present

## 2023-11-19 LAB — BASIC METABOLIC PANEL
Anion gap: 14 (ref 5–15)
BUN: 29 mg/dL — ABNORMAL HIGH (ref 8–23)
CO2: 25 mmol/L (ref 22–32)
Calcium: 8.1 mg/dL — ABNORMAL LOW (ref 8.9–10.3)
Chloride: 99 mmol/L (ref 98–111)
Creatinine, Ser: 5.45 mg/dL — ABNORMAL HIGH (ref 0.44–1.00)
GFR, Estimated: 8 mL/min — ABNORMAL LOW (ref >60)
Glucose, Bld: 129 mg/dL — ABNORMAL HIGH (ref 70–99)
Potassium: 3.7 mmol/L (ref 3.5–5.1)
Sodium: 138 mmol/L (ref 135–145)

## 2023-11-19 LAB — GLUCOSE, CAPILLARY
Glucose-Capillary: 131 mg/dL — ABNORMAL HIGH (ref 70–99)
Glucose-Capillary: 134 mg/dL — ABNORMAL HIGH (ref 70–99)
Glucose-Capillary: 207 mg/dL — ABNORMAL HIGH (ref 70–99)
Glucose-Capillary: 186 mg/dL — ABNORMAL HIGH (ref 70–99)

## 2023-11-19 LAB — MAGNESIUM: Magnesium: 2 mg/dL (ref 1.7–2.4)

## 2023-11-19 LAB — CBC
HCT: 30.3 % — ABNORMAL LOW (ref 36.0–46.0)
Hemoglobin: 10.1 g/dL — ABNORMAL LOW (ref 12.0–15.0)
MCH: 29 pg (ref 26.0–34.0)
MCHC: 33.3 g/dL (ref 30.0–36.0)
MCV: 87.1 fL (ref 80.0–100.0)
Platelets: 321 10*3/uL (ref 150–400)
RBC: 3.48 MIL/uL — ABNORMAL LOW (ref 3.87–5.11)
RDW: 18.7 % — ABNORMAL HIGH (ref 11.5–15.5)
WBC: 8.3 10*3/uL (ref 4.0–10.5)
nRBC: 1.1 % — ABNORMAL HIGH (ref 0.0–0.2)

## 2023-11-19 MED ORDER — CHLORHEXIDINE GLUCONATE CLOTH 2 % EX PADS
6.0000 | MEDICATED_PAD | Freq: Every day | CUTANEOUS | Status: DC
Start: 2023-11-19 — End: 2023-11-22
  Administered 2023-11-19 – 2023-11-21 (×2): 6 via TOPICAL

## 2023-11-19 MED ORDER — HYDRALAZINE HCL 50 MG PO TABS
100.0000 mg | ORAL_TABLET | Freq: Three times a day (TID) | ORAL | Status: DC
  Administered 2023-11-19 – 2023-11-22 (×10): 100 mg via ORAL
  Filled 2023-11-19 (×10): qty 2

## 2023-11-19 NOTE — Progress Notes (Signed)
VASCULAR LAB    Carotid duplex has been performed.  See CV proc for preliminary results.   Jobani Sabado, RVT 11/19/2023, 11:52 AM

## 2023-11-19 NOTE — Progress Notes (Signed)
Carolyn Russo Progress Note   Subjective:   Pt reports ongoing cough and chest soreness. Denies SOB, dizziness, nausea.   Objective Vitals:   11/18/23 2211 11/18/23 2221 11/19/23 0325 11/19/23 0808  BP: (!) 173/99 136/76 (!) 166/90 (!) 160/73  Pulse: 93 76 80 72  Resp: 20 20 18 14   Temp:  97.6 F (36.4 C) 98.5 F (36.9 C) (!) 97 F (36.1 C)  TempSrc:  Oral Oral Axillary  SpO2: 100% 100% 100% 100%  Weight:      Height:       Physical Exam General: Alert female in NAD Heart: RRR, no murmurs, rubs or gallops Lungs: + frequent cough. Lungs CTA bilaterally. Respirations unlabored Abdomen: Soft, non-distended, +BS Extremities: No edema b/l lower extremities Dialysis Access: R femoral [facility]  Additional Objective Labs: Basic Metabolic Panel: Recent Labs  Lab 11/16/23 1044 11/16/23 1657 11/17/23 0012 11/17/23 0349 11/17/23 1200 11/18/23 0205  NA 136   < > 137 139  --  136  K 3.3*   < > 5.2* 4.1  --  3.7  CL 93*  --   --  98  --  98  CO2 28  --   --  26  --  26  GLUCOSE 106*  --   --  117*  --  98  BUN 19  --   --  22  --  11  CREATININE 4.79*  --   --  5.68*  --  3.15*  CALCIUM 8.0*  --   --  7.9*  --  8.3*  PHOS  --   --   --   --  4.7*  --    < > = values in this interval not displayed.   Liver Function Tests: Recent Labs  Lab 11/16/23 2200  AST 18  ALT 12  ALKPHOS 110  BILITOT 0.7  PROT 7.3  ALBUMIN 2.9*   No results for input(s): "LIPASE", "AMYLASE" in the last 168 hours. CBC: Recent Labs  Lab 11/16/23 1044 11/16/23 1657 11/17/23 0012 11/17/23 0349 11/18/23 0205  WBC 9.0  --   --  8.0 9.0  HGB 9.9*   < > 9.9* 10.3* 10.5*  HCT 29.3*   < > 29.0* 30.3* 30.9*  MCV 87.7  --   --  87.6 87.3  PLT 284  --   --  301 294   < > = values in this interval not displayed.   Blood Culture    Component Value Date/Time   SDES BLOOD SITE NOT SPECIFIED 11/16/2023 2248   SPECREQUEST  11/16/2023 2248    BOTTLES DRAWN AEROBIC AND ANAEROBIC Blood  Culture results may not be optimal due to an inadequate volume of blood received in culture bottles   CULT  11/16/2023 2248    NO GROWTH 2 DAYS Performed at [facility], [address] [place], [place] [address]    REPTSTATUS PENDING 11/16/2023 2248    Cardiac Enzymes: No results for input(s): "CKTOTAL", "CKMB", "CKMBINDEX", "TROPONINI" in the last 168 hours. CBG: Recent Labs  Lab 11/18/23 0622 11/18/23 1202 11/18/23 1640 11/18/23 2100 11/19/23 0621  GLUCAP 77 132* 232* 210* 131*   Iron Studies: No results for input(s): "IRON", "TIBC", "TRANSFERRIN", "FERRITIN" in the last 72 hours. @lablastinr3 @ Studies/Results: ECHOCARDIOGRAM LIMITED Result Date: 11/17/2023    ECHOCARDIOGRAM LIMITED REPORT   Patient Name:   Carolyn Russo [facility] Date of Exam: 11/17/2023 Medical Rec #:  [id number]      Height:  63.0 in Accession #:    [id number]     Weight:       175.3 lb Date of Birth:  31-Dec-1950      BSA:          1.828 m Patient Age:    73 years       BP:           154/88 mmHg Patient Gender: F              HR:           93 bpm. Exam Location:  Inpatient Procedure: Limited Echo, Limited Color Doppler and Cardiac Doppler Indications:    stroke  History:        Patient has prior history of Echocardiogram examinations, most                 recent 10/25/2023. CAD, end stage renal disease; Risk                 Factors:Hypertension.  Sonographer:    Delcie Roch RDCS Referring Phys: [id number] [facility] GOEL IMPRESSIONS  1. Left ventricular ejection fraction, by estimation, is 55 to 60%. The left ventricle has normal function. The left ventricle has no regional wall motion abnormalities. Elevated left ventricular end-diastolic pressure.  2. Right ventricular systolic function is normal. The right ventricular size is normal. There is severely elevated pulmonary artery systolic pressure.  3. The mitral valve is normal in structure. Trivial mitral valve regurgitation. No evidence of mitral stenosis.  4. The  aortic valve is tricuspid. There is mild calcification of the aortic valve. Aortic valve regurgitation is not visualized. No aortic stenosis is present. Aortic valve mean gradient measures 11.0 mmHg. Aortic valve Vmax measures 2.16 m/s.  5. The inferior vena cava is dilated in size with <50% respiratory variability, suggesting right atrial pressure of 15 mmHg. FINDINGS  Left Ventricle: Left ventricular ejection fraction, by estimation, is 55 to 60%. The left ventricle has normal function. The left ventricle has no regional wall motion abnormalities. The left ventricular internal cavity size was normal in size. There is  no left ventricular hypertrophy. Elevated left ventricular end-diastolic pressure. Right Ventricle: The right ventricular size is normal. No increase in right ventricular wall thickness. Right ventricular systolic function is normal. There is severely elevated pulmonary artery systolic pressure. The tricuspid regurgitant velocity is 3.47 m/s, and with an assumed right atrial pressure of 15 mmHg, the estimated right ventricular systolic pressure is 63.2 mmHg. Left Atrium: Left atrial size was normal in size. Right Atrium: Right atrial size was normal in size. Pericardium: There is no evidence of pericardial effusion. Mitral Valve: The mitral valve is normal in structure. Mild mitral annular calcification. Trivial mitral valve regurgitation. No evidence of mitral valve stenosis. Tricuspid Valve: The tricuspid valve is normal in structure. Tricuspid valve regurgitation is mild . No evidence of tricuspid stenosis. Aortic Valve: The aortic valve is tricuspid. There is mild calcification of the aortic valve. Aortic valve regurgitation is not visualized. No aortic stenosis is present. Aortic valve mean gradient measures 11.0 mmHg. Aortic valve peak gradient measures 18.7 mmHg. Pulmonic Valve: The pulmonic valve was normal in structure. Pulmonic valve regurgitation is not visualized. No evidence of pulmonic  stenosis. Aorta: The aortic root is normal in size and structure. Venous: The inferior vena cava is dilated in size with less than 50% respiratory variability, suggesting right atrial pressure of 15 mmHg. IAS/Shunts: No atrial level shunt detected by color flow Doppler. Additional  Comments: Spectral Doppler performed. Color Doppler performed.   Diastology LV e' medial:    5.55 cm/s LV E/e' medial:  22.2 LV e' lateral:   8.92 cm/s LV E/e' lateral: 13.8  IVC IVC diam: 2.40 cm AORTIC VALVE AV Vmax:           216.00 cm/s AV Vmean:          152.000 cm/s AV VTI:            0.407 m AV Peak Grad:      18.7 mmHg AV Mean Grad:      11.0 mmHg LVOT Vmax:         110.00 cm/s LVOT Vmean:        73.700 cm/s LVOT VTI:          0.210 m LVOT/AV VTI ratio: 0.52  AORTA Ao Asc diam: 2.80 cm MITRAL VALVE                TRICUSPID VALVE MV Area (PHT): 4.80 cm     TR Peak grad:   48.2 mmHg MV Decel Time: 158 msec     TR Vmax:        347.00 cm/s MV E velocity: 123.00 cm/s MV A velocity: 119.00 cm/s  SHUNTS MV E/A ratio:  1.03         Systemic VTI: 0.21 m Chilton Si MD Electronically signed by Chilton Si MD Signature Date/Time: 11/17/2023/6:14:59 PM    Final    MR ANGIO HEAD WO CONTRAST Result Date: 11/17/2023 CLINICAL DATA:  Neuro deficit, acute, stroke suspected. Right PICA infarct and abnormal appearance of the distal right vertebral artery on MRI. EXAM: MRA HEAD WITHOUT CONTRAST TECHNIQUE: Angiographic images of the Circle of Willis were acquired using MRA technique without intravenous contrast. COMPARISON:  Head MRI 11/16/2023. Head and neck CTA 06/22/2022. Head MRA 06/03/2022. FINDINGS: The study is motion degraded, moderately so towards the skull base Anterior circulation: The internal carotid arteries are widely patent from skull base to carotid termini. ACAs and MCAs are patent without evidence of a proximal branch occlusion or significant A1 or M1 stenosis. No aneurysm is identified. Posterior circulation: The  included portion of the intracranial left vertebral artery is widely patent and dominant. The included intracranial right vertebral artery is also patent but is diffusely small distally which reflects a change from the prior CTA. Patent AICA and SCA origins are visualized bilaterally. The basilar artery is patent with decreased signal through its mid to distal portions attributed to motion artifact based on source images. There are large posterior communicating arteries bilaterally. The PCAs are patent with severe bilateral P2 stenoses, likely progressed. No aneurysm is identified. Anatomic variants: None. IMPRESSION: 1. Motion degraded examination. 2. No large vessel occlusion. 3. Severe bilateral P2 stenoses. 4. Patent but diffusely narrowed distal right vertebral artery. Electronically Signed   By: Sebastian Ache M.D.   On: 11/17/2023 17:09   EEG adult Result Date: 11/17/2023 Charlsie Quest, MD     11/17/2023 10:44 AM Patient Name: AREONA HOMER MRN: [id number] Epilepsy Attending: Charlsie Quest Referring Physician/Provider: Caryl Pina, MD Date: 11/17/2023 Duration: 23.09 mins Patient history: 73 year old female with history of seizures who presented with altered mental status.  EEG to evaluate for seizure. Level of alertness: Awake AEDs during EEG study: LEV Technical aspects: This EEG study was done with scalp electrodes positioned according to the 10-20 International system of electrode placement. Electrical activity was reviewed with band pass filter of 1-70Hz , sensitivity  of 7 uV/mm, display speed of 61mm/sec with a 60Hz  notched filter applied as appropriate. EEG data were recorded continuously and digitally stored.  Video monitoring was available and reviewed as appropriate. Description: The posterior dominant rhythm consists of 8 Hz activity of moderate voltage (25-35 uV) seen predominantly in posterior head regions, symmetric and reactive to eye opening and eye closing. EEG showed intermittent  generalized 3 to 6 Hz theta-delta slowing.spikes were noted in right temporoparietal region. Hyperventilation and photic stimulation were not performed.   Of note, study was technically difficult due to significant movement artifact. 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  Additionally there is mild diffuse encephalopathy. No seizures were seen throughout the recording. Priyanka Annabelle Harman   Medications:  cefTRIAXone (ROCEPHIN)  IV Stopped (11/18/23 2130)   levETIRAcetam 380 mg (11/17/23 1724)   levETIRAcetam 750 mg (11/18/23 1503)     stroke: early stages of recovery book   Does not apply Once   aspirin EC  81 mg Oral Daily   bisacodyl  10 mg Oral QHS   Chlorhexidine Gluconate Cloth  6 each Topical Q0600   fluticasone furoate-vilanterol  1 puff Inhalation Daily   guaiFENesin  600 mg Oral BID   heparin injection (subcutaneous)  5,000 Units Subcutaneous Q8H   insulin aspart  0-5 Units Subcutaneous QHS   insulin aspart  0-6 Units Subcutaneous TID WC   ipratropium-albuterol  3 mL Nebulization Q6H   lidocaine  1 patch Transdermal Q24H   methylPREDNISolone (SOLU-MEDROL) injection  40 mg Intravenous Q24H   pantoprazole (PROTONIX) IV  40 mg Intravenous Q24H   phenylephrine  1 suppository Rectal BID   polyethylene glycol  17 g Oral BID   rosuvastatin  20 mg Oral QHS   sevelamer carbonate  800 mg Oral TID WC   sodium chloride flush  3 mL Intravenous Q12H   ticagrelor  90 mg Oral BID    Dialysis Orders: MWF [facility]  4h   B350    75.5kg   2/2 bath   R fem TDC  Heparin 2600 + 1400 midrun - last HD 1/22, getting to dry wt - 3-4 drops in dry wt in last 3 wks - venofer 100 qhd thru 1/31 - mircera 150 q2, last 1/17, due 1/31  Assessment/Plan: AHRF/ CAP - started on IV abx and supp O2. +COVID and RSV. Imaging suggest vol overload as well. Had HD  with 3.5L UF AMS - pt was lethargic, possible new CVA by imaging. Seen by neurology - altered MS could be related to hypoxia, TME, post-ictal. Seems to be improving.  ESRD - on HD MWF. HD Monday.  HTN - pt BP's were low on arrival, now elevated, resume home antihypertensives as needed Volume - Still above EDW, UF with HD as tolerated Anemia of esrd - Hb 10.1. Esa due on 1/31. Follow.  Secondary hyperparathyroidism - CCa in range, phos controlled. Cont binders w/ meals.  H/o seizure d/o - on Keppra  Rogers Blocker, Cordelia Poche 11/19/2023, 9:05 AM  [employer] Pager: [phone]

## 2023-11-19 NOTE — Progress Notes (Signed)
Carolyn Russo Progress Note  Patient: Carolyn Russo    ZOX:[id number]  DOA: 11/16/2023     Date of Service: the patient was seen and examined on 11/19/2023  Chief Complaint  Patient presents with   Chest Pain   Dialysis pt   Brief hospital course: Patient is a 73 year old African-American female, past medical history significant for ESRD on HD (MWF), CHF, CVA with residual left-sided weakness, type 2 diabetes, right BKA, and seizure disorder.  Patient presented at [facility] on 11/16/23, with confusion and worsening left chest pain radiating to the arm and left-sided shoulder blade pain (back pain).  Patient has had similar presentation (chest and back pain) in the past, with negative workup.  MRI of the brain revealed 1.3 cm acute to early subacute right PICA distribution infarct.  Patient is on aspirin and Brilinta.  Neurology team is directing care.  Workup is still in progress.  Echo revealed EF of 55-60% with moderate left ventricular hypertrophy and mild aortic valve calcification.  Patient troponin was minimally elevated to 200s.  Patient also had a CT chest angiogram done with no evidence of pulmonary embolism.  Intermittent confusion has improved.    11/18/2023: Patient seen alongside patient's husband.  All questions answered.  Patient is improving slowly. 11/19/2023: Patient seen.  No new changes.  Neurology workup is still in progress.  Likely discharge once neurowork-up is completed.  Elevated blood pressure noted.  Will start hydralazine 100 Mg p.o. 3 times daily.  Continue to hold ARB for now.   Assessment and Plan: # Acute encephalopathy -Likely multifactorial. -Acute metabolic encephalopathy could be secondary to COVID viral infection and RSV infection along with acute CVA and seizure disorder (see above documentation). -Encephalopathy has improved significantly.   -Continue supportive care -Continue delirium precautions -Likely discharge once neurology workup is completed (CVA  workup)  # Acute CVA -Patient has prior history of CVA with residual left sided weakness -MRI positive for acute to subacute CVA Continue aspirin and Brilinta   Continue statin LDL 37, way below goal  <70, TSH 1.5 WNL -Neurology input is highly appreciated.  # Seizure disorder, patient is on Keppra EEG findings noted. Continue Keppra 750 mg twice daily and additional dose after hemodialysis Continue seizure precautions  # Pneumonia due to COVID and RSV along with possible bacterial superinfection Complete course of antibiotics (ceftriaxone and azithromycin) Continue tapering steroid. PPI for GI prophylaxis Continue Breo Ellipta inhaler and DuoNeb every 6 hourly Mucinex 600 mg p.o. twice daily Procalcitonin 0.86 WBC count within normal range  # ESRD on hemodialysis, MWF schedule Continue hemodialysis Monday, Wednesday and Friday.  # Diabetes mellitus type 2 -Continue NovoLog sliding scale  DVT Prophylaxis: Heparin subcu  Advance goals of care discussion: Full code  Family Communication:   Disposition:  Pt is from SNF, admitted with acute stroke and pneumonia due to COVID and RSV, which precludes a safe discharge. Discharge to SNF, when stable, may need 1 to 2 days to improve.  Subjective: -No new complaints. -Confusion continues to improve.  Physical Exam: General: Patient is awake and alert.  Patient is not in any distress.   HEENT: PATIENT IS PALE.  NO JAUNDICE.  ERRLA Neck: no JVD,  Cardiovascular: S1 and S2   Lungs: Adequate air entry.   Abdomen: Soft and nontender.   Neurologic: Left-sided residual weakness due to prior CVA.   Vitals:   11/18/23 2221 11/19/23 0325 11/19/23 0808 11/19/23 1057  BP: 136/76 (!) 166/90 (!) 160/73 (!) 163/86  Pulse: 76 80 72   Resp: 20 18 14    Temp: 97.6 F (36.4 C) 98.5 F (36.9 C) (!) 97 F (36.1 C)   TempSrc: Oral Oral Axillary   SpO2: 100% 100% 100%   Weight:      Height:        Intake/Output Summary (Last 24  hours) at 11/19/2023 1612 Last data filed at 11/19/2023 0325 Gross per 24 hour  Intake 354.48 ml  Output 0 ml  Net 354.48 ml   Filed Weights   11/17/23 1900 11/18/23 0500  Weight: 79.5 kg 79.5 kg    Data Reviewed: I have personally reviewed and interpreted daily labs, tele strips, imagings as discussed above. I reviewed all nursing notes, pharmacy notes, vitals, pertinent old records I have discussed plan of care as described above with RN and patient/family.  CBC: Recent Labs  Lab 11/16/23 1044 11/16/23 1657 11/16/23 2115 11/17/23 0012 11/17/23 0349 11/18/23 0205 11/19/23 0822  WBC 9.0  --   --   --  8.0 9.0 8.3  HGB 9.9*   < > 11.2* 9.9* 10.3* 10.5* 10.1*  HCT 29.3*   < > 33.0* 29.0* 30.3* 30.9* 30.3*  MCV 87.7  --   --   --  87.6 87.3 87.1  PLT 284  --   --   --  301 294 321   < > = values in this interval not displayed.   Basic Metabolic Panel: Recent Labs  Lab 11/16/23 1044 11/16/23 1657 11/16/23 2115 11/17/23 0012 11/17/23 0349 11/17/23 1200 11/18/23 0205 11/19/23 0822  NA 136   < > 136 137 139  --  136 138  K 3.3*   < > 3.4* 5.2* 4.1  --  3.7 3.7  CL 93*  --   --   --  98  --  98 99  CO2 28  --   --   --  26  --  26 25  GLUCOSE 106*  --   --   --  117*  --  98 129*  BUN 19  --   --   --  22  --  11 29*  CREATININE 4.79*  --   --   --  5.68*  --  3.15* 5.45*  CALCIUM 8.0*  --   --   --  7.9*  --  8.3* 8.1*  MG  --   --   --   --   --   --  1.9 2.0  PHOS  --   --   --   --   --  4.7*  --   --    < > = values in this interval not displayed.    Studies: VAS US CAROTID Result Date: 11/19/2023 Carotid Arterial Duplex Study Patient Name:  Carolyn Russo [facility]  Date of Exam:   11/19/2023 Medical Rec #: [id number]       Accession #:    [id number] Date of Birth: 11-01-1950       Patient Gender: F Patient Age:   25 years Exam Location:  [facility] Procedure:      VAS US CAROTID Referring Phys: Carolyn Russo  --------------------------------------------------------------------------------  Indications:       CVA. Risk Factors:      Hypertension, hyperlipidemia, Diabetes. Other Factors:     Covid and RSV positive. ESRD, on dialysis. Right BKA. Seizure  disorder. Limitations        Today's exam was limited due to the patient's respiratory                    variation and the patient's inability or unwillingness to                    cooperate. Comparison Study:  Prior carotid duplex done 06/04/22 indicated bilateral 1-39%                    ICA stenosis Performing Technologist: Sherren Kerns RVS  Examination Guidelines: A complete evaluation includes B-mode imaging, spectral Doppler, color Doppler, and power Doppler as needed of all accessible portions of each vessel. Bilateral testing is considered an integral part of a complete examination. Limited examinations for reoccurring indications may be performed as noted.  Right Carotid Findings: +----------+--------+--------+--------+------------------+--------+           PSV cm/sEDV cm/sStenosisPlaque DescriptionComments +----------+--------+--------+--------+------------------+--------+ CCA Distal77      14                                         +----------+--------+--------+--------+------------------+--------+ ECA       75      10                                         +----------+--------+--------+--------+------------------+--------+ Unable to adequately visualize vessels secondary to noncooperative patient Left Carotid Findings: +----------+--------+--------+--------+------------------+------------------+           PSV cm/sEDV cm/sStenosisPlaque DescriptionComments           +----------+--------+--------+--------+------------------+------------------+ CCA Prox  97      11                                intimal thickening +----------+--------+--------+--------+------------------+------------------+ CCA Distal67       11                                intimal thickening +----------+--------+--------+--------+------------------+------------------+ ICA Prox  99      19              heterogenous                         +----------+--------+--------+--------+------------------+------------------+ ICA Mid   96      22                                                   +----------+--------+--------+--------+------------------+------------------+ ICA Distal78      19                                                   +----------+--------+--------+--------+------------------+------------------+ ECA       83      9                                                    +----------+--------+--------+--------+------------------+------------------+ +----------+--------+--------+--------+-------------------+  PSV cm/sEDV cm/sDescribeArm Pressure (mmHG) +----------+--------+--------+--------+-------------------+ [id number]                                         +----------+--------+--------+--------+-------------------+ +---------+--------+--+--------+--+ VertebralPSV cm/s66EDV cm/s10 +---------+--------+--+--------+--+   Summary: Right Carotid: Unable to assess. Left Carotid: Velocities in the left ICA are consistent with a 1-39% stenosis. Vertebrals:  Left vertebral artery demonstrates antegrade flow. Subclavians: Normal flow hemodynamics were seen in the left subclavian artery. *See table(s) above for measurements and observations.     Preliminary     Scheduled Meds:   stroke: early stages of recovery book   Does not apply Once   aspirin EC  81 mg Oral Daily   bisacodyl  10 mg Oral QHS   Chlorhexidine Gluconate Cloth  6 each Topical Q0600   Chlorhexidine Gluconate Cloth  6 each Topical Q0600   fluticasone furoate-vilanterol  1 puff Inhalation Daily   guaiFENesin  600 mg Oral BID   heparin injection (subcutaneous)  5,000 Units Subcutaneous Q8H   hydrALAZINE  100 mg Oral  Q8H   insulin aspart  0-5 Units Subcutaneous QHS   insulin aspart  0-6 Units Subcutaneous TID WC   lidocaine  1 patch Transdermal Q24H   methylPREDNISolone (SOLU-MEDROL) injection  40 mg Intravenous Q24H   pantoprazole (PROTONIX) IV  40 mg Intravenous Q24H   phenylephrine  1 suppository Rectal BID   polyethylene glycol  17 g Oral BID   rosuvastatin  20 mg Oral QHS   sevelamer carbonate  800 mg Oral TID WC   sodium chloride flush  3 mL Intravenous Q12H   ticagrelor  90 mg Oral BID   Continuous Infusions:  cefTRIAXone (ROCEPHIN)  IV Stopped (11/18/23 2130)   levETIRAcetam 380 mg (11/17/23 1724)   levETIRAcetam 750 mg (11/19/23 1102)   PRN Meds: acetaminophen **OR** acetaminophen, bisacodyl, chlorpheniramine-HYDROcodone, hydrALAZINE, labetalol, LORazepam, nitroGLYCERIN, phenylephrine **FOLLOWED BY** phenylephrine, SMOG, witch hazel-glycerin  Time spent: 35 minutes  Author: Barnetta Chapel, MD.   Carolyn Hospitalist 11/19/2023 4:12 PM  To reach On-call, see care teams to locate the attending and reach out to them via [email or web address]. If 7PM-7AM, please contact night-coverage If you still have difficulty reaching the attending provider, please page the [name] (Director on Call) for Carolyn Russo on amion for assistance.

## 2023-11-19 NOTE — Progress Notes (Signed)
 STROKE TEAM PROGRESS NOTE   BRIEF HPI [name] is a 73 y.o. female who presented from her SNF for chest pain, confusion and weakness.  CT head revealed infarct in the inferior right cerebellum which was new since 06/12/2022, MRI brain with acute right inferomedial cerebellar ischemic infarction in the right PICA territory.   SIGNIFICANT HOSPITAL EVENTS 1/24 CT head revealed infarct in the inferior right cerebellum which was new since 06/12/2022, MRI brain with acute right inferomedial cerebellar ischemic infarction in the right PICA territory.  In the ED she was found to have COVID/RSV  INTERIM HISTORY/SUBJECTIVE No family at the bedside.  Patient is laying in the bed in no apparent distress.  She is sleeping but wakes easily No new neurological events overnight Carotid ultrasound is still pending  OBJECTIVE  CBC    Component Value Date/Time   WBC 8.3 11/19/2023 0822   RBC 3.48 (L) 11/19/2023 0822   HGB 10.1 (L) 11/19/2023 0822   HGB 9.3 (L) 04/13/2022 1512   HCT 30.3 (L) 11/19/2023 0822   HCT 27.6 (L) 04/13/2022 1512   PLT 321 11/19/2023 0822   PLT 341 04/13/2022 1512   MCV 87.1 11/19/2023 0822   MCV 89 04/13/2022 1512   MCH 29.0 11/19/2023 0822   MCHC 33.3 11/19/2023 0822   RDW 18.7 (H) 11/19/2023 0822   RDW 16.1 (H) 04/13/2022 1512   LYMPHSABS 3.4 06/08/2022 0340   LYMPHSABS 2.4 04/13/2022 1512   MONOABS 1.8 (H) 06/08/2022 0340   EOSABS 0.4 06/08/2022 0340   EOSABS 0.4 04/13/2022 1512   BASOSABS 0.1 06/08/2022 0340   BASOSABS 0.1 04/13/2022 1512    BMET    Component Value Date/Time   NA 138 11/19/2023 0822   NA 142 04/13/2022 1512   K 3.7 11/19/2023 0822   CL 99 11/19/2023 0822   CO2 25 11/19/2023 0822   GLUCOSE 129 (H) 11/19/2023 0822   BUN 29 (H) 11/19/2023 0822   BUN 12 04/13/2022 1512   CREATININE 5.45 (H) 11/19/2023 0822   CREATININE 0.84 10/13/2011 0903   CALCIUM 8.1 (L) 11/19/2023 0822   EGFR 12 (L) 04/13/2022 1512   GFRNONAA 8 (L)  11/19/2023 0822   GFRNONAA 75 10/13/2011 0903    IMAGING past 24 hours VAS US CAROTID Result Date: 11/19/2023 Carotid Arterial Duplex Study Patient Name:  Carolyn Russo [facility]  Date of Exam:   11/19/2023 Medical Rec #: [id number]       Accession #:    [id number] Date of Birth: July 04, 1951       Patient Gender: F Patient Age:   34 years Exam Location:  [facility] Procedure:      VAS US CAROTID Referring Phys: Gillis Santa --------------------------------------------------------------------------------  Indications:       CVA. Risk Factors:      Hypertension, hyperlipidemia, Diabetes. Other Factors:     Covid and RSV positive. ESRD, on dialysis. Right BKA. Seizure                    disorder. Limitations        Today's exam was limited due to the patient's respiratory                    variation and the patient's inability or unwillingness to                    cooperate. Comparison Study:  Prior carotid duplex done 06/04/22 indicated bilateral 1-39%  ICA stenosis Performing Technologist: Sherren Kerns RVS  Examination Guidelines: A complete evaluation includes B-mode imaging, spectral Doppler, color Doppler, and power Doppler as needed of all accessible portions of each vessel. Bilateral testing is considered an integral part of a complete examination. Limited examinations for reoccurring indications may be performed as noted.  Right Carotid Findings: +----------+--------+--------+--------+------------------+--------+           PSV cm/sEDV cm/sStenosisPlaque DescriptionComments +----------+--------+--------+--------+------------------+--------+ CCA Distal77      14                                         +----------+--------+--------+--------+------------------+--------+ ECA       75      10                                         +----------+--------+--------+--------+------------------+--------+ Unable to adequately visualize vessels secondary to noncooperative patient  Left Carotid Findings: +----------+--------+--------+--------+------------------+------------------+           PSV cm/sEDV cm/sStenosisPlaque DescriptionComments           +----------+--------+--------+--------+------------------+------------------+ CCA Prox  97      11                                intimal thickening +----------+--------+--------+--------+------------------+------------------+ CCA Distal67      11                                intimal thickening +----------+--------+--------+--------+------------------+------------------+ ICA Prox  99      19              heterogenous                         +----------+--------+--------+--------+------------------+------------------+ ICA Mid   96      22                                                   +----------+--------+--------+--------+------------------+------------------+ ICA Distal78      19                                                   +----------+--------+--------+--------+------------------+------------------+ ECA       83      9                                                    +----------+--------+--------+--------+------------------+------------------+ +----------+--------+--------+--------+-------------------+           PSV cm/sEDV cm/sDescribeArm Pressure (mmHG) +----------+--------+--------+--------+-------------------+ [id number]                                         +----------+--------+--------+--------+-------------------+ +---------+--------+--+--------+--+ VertebralPSV cm/s66EDV  cm/s10 +---------+--------+--+--------+--+   Summary: Right Carotid: Unable to assess. Left Carotid: Velocities in the left ICA are consistent with a 1-39% stenosis. Vertebrals:  Left vertebral artery demonstrates antegrade flow. Subclavians: Normal flow hemodynamics were seen in the left subclavian artery. *See table(s) above for measurements and observations.     Preliminary      Vitals:   11/18/23 2221 11/19/23 0325 11/19/23 0808 11/19/23 1057  BP: 136/76 (!) 166/90 (!) 160/73 (!) 163/86  Pulse: 76 80 72   Resp: 20 18 14    Temp: 97.6 F (36.4 C) 98.5 F (36.9 C) (!) 97 F (36.1 C)   TempSrc: Oral Oral Axillary   SpO2: 100% 100% 100%   Weight:      Height:         PHYSICAL EXAM General: critically ill elderly African-American lady in no acute distress Psych:  Mood and affect appropriate for situation CV: Regular rate and rhythm on monitor Respiratory:  Regular, unlabored respirations on room air GI: Abdomen soft and nontender   NEURO:  Mental Status: Awake alert and oriented x 3 Speech/Language: speech is with mild dysarthria and mild aphasia.   Cranial Nerves:  II: PERRL. Visual fields full.  III, IV, VI: EOMI. Eyelids elevate symmetrically.  V: Sensation is intact to light touch and symmetrical to face.  VII: Face is symmetrical resting and smiling VIII: hearing intact to voice. IX, X: Palate elevates symmetrically. Phonation is normal.  WU:[id number] shrug 5/5. XII: tongue is midline without fasciculations. Motor: 5/5 strength in right upper and arm. Left upper and lower 4/5.  Right BKA Tone: Increased in left upper extremity with nonfixed flexion contractures of the left hand fingers Sensation- Intact to light touch bilaterally. Extinction absent to light touch to DSS.   Coordination: FTN intact bilaterally, HKS: no ataxia in BLE.No drift.  Gait- deferred   ASSESSMENT/PLAN  Acute Ischemic Infarct:  right PCA  Etiology:  likely embolic   CT head Infarct in the inferior right cerebellum   MRI  1.3 cm acute to early subacute right PICA distribution infarct  MRA slightly hypoplastic posterior circulation with mild narrowing of the terminal left vertebral and distal basilar.   Carotid Doppler  ordered 2D Echo EF 55 to 60% Recommend 30 day heart monitor after discharge Will need neurology outpatient follow-up in 8 weeks after  discharge LDL 37 HgbA1c 5.8 VTE prophylaxis - SCD aspirin 81 mg daily and Brilinta (ticagrelor) 90 mg bid prior to admission, now on aspirin 81 mg daily and Brilinta (ticagrelor) 90 mg bid  Therapy recommendations:  Pending Disposition:  pending  Hx of Stroke/TIA Stroke history  Migraine Hx of right BG in 2022 On keppra 750 mg at bedtime for focal epilepsy. 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  Additionally there is mild diffuse encephalopathy. No seizures were seen throughout the recording.    Hypertension CHF Home meds:  hydralazine 100mg , losartan 50mg ,  Stable Blood Pressure Goal: BP less than 220/110   Hyperlipidemia Home meds:  crestor 20mg ,  resumed in hospital LDL 37, goal < 70 Continue statin at discharge  Diabetes type II Controlled Home meds:  insulin HgbA1c 5.8, goal < 7.0 CBGs SSI Recommend close follow-up with PCP for better DM control  Dysphagia Patient has post-stroke dysphagia, SLP consulted    Diet   DIET DYS 2 Room service appropriate? Yes; Fluid consistency: Thin   Advance diet as tolerated  Other Stroke Risk Factors Obesity, Body  mass index is 31.05 kg/m., BMI >/= 30 associated with increased stroke risk, recommend weight loss, diet and exercise as appropriate  Congestive heart failure  Other Active Problems ESRD on HD  COVID/ RSV  Hospital day # 3  Gevena Mart DNP, ACNPC-AG  [facility]    To contact Stroke Continuity provider, please refer to [email or web address]. After hours, contact General Neurology

## 2023-11-19 NOTE — Progress Notes (Signed)
CSW attempted to speak with pt in regards to SNF recommendation however pt did not answer phone.

## 2023-11-20 ENCOUNTER — Ambulatory Visit: Payer: Medicare PPO | Admitting: Cardiovascular Disease

## 2023-11-20 DIAGNOSIS — G9340 Encephalopathy, unspecified: Secondary | ICD-10-CM | POA: Diagnosis not present

## 2023-11-20 LAB — GLUCOSE, CAPILLARY
Glucose-Capillary: 101 mg/dL — ABNORMAL HIGH (ref 70–99)
Glucose-Capillary: 124 mg/dL — ABNORMAL HIGH (ref 70–99)
Glucose-Capillary: 194 mg/dL — ABNORMAL HIGH (ref 70–99)
Glucose-Capillary: 160 mg/dL — ABNORMAL HIGH (ref 70–99)

## 2023-11-20 LAB — CBC WITH DIFFERENTIAL/PLATELET
Abs Immature Granulocytes: 0 10*3/uL (ref 0.00–0.07)
Basophils Absolute: 0 10*3/uL (ref 0.0–0.1)
Basophils Relative: 0 %
Eosinophils Absolute: 0 10*3/uL (ref 0.0–0.5)
Eosinophils Relative: 0 %
HCT: 32 % — ABNORMAL LOW (ref 36.0–46.0)
Hemoglobin: 10.7 g/dL — ABNORMAL LOW (ref 12.0–15.0)
Lymphocytes Relative: 19 %
Lymphs Abs: 1.8 10*3/uL (ref 0.7–4.0)
MCH: 29 pg (ref 26.0–34.0)
MCHC: 33.4 g/dL (ref 30.0–36.0)
MCV: 86.7 fL (ref 80.0–100.0)
Monocytes Absolute: 1.2 10*3/uL — ABNORMAL HIGH (ref 0.1–1.0)
Monocytes Relative: 12 %
Neutro Abs: 6.6 10*3/uL (ref 1.7–7.7)
Neutrophils Relative %: 69 %
Platelets: 338 10*3/uL (ref 150–400)
RBC: 3.69 MIL/uL — ABNORMAL LOW (ref 3.87–5.11)
RDW: 18.7 % — ABNORMAL HIGH (ref 11.5–15.5)
WBC: 9.6 10*3/uL (ref 4.0–10.5)
nRBC: 0 /100{WBCs}
nRBC: 0.7 % — ABNORMAL HIGH (ref 0.0–0.2)

## 2023-11-20 LAB — RENAL FUNCTION PANEL
Albumin: 2.9 g/dL — ABNORMAL LOW (ref 3.5–5.0)
Anion gap: 14 (ref 5–15)
BUN: 39 mg/dL — ABNORMAL HIGH (ref 8–23)
CO2: 24 mmol/L (ref 22–32)
Calcium: 8 mg/dL — ABNORMAL LOW (ref 8.9–10.3)
Chloride: 97 mmol/L — ABNORMAL LOW (ref 98–111)
Creatinine, Ser: 6.4 mg/dL — ABNORMAL HIGH (ref 0.44–1.00)
GFR, Estimated: 6 mL/min — ABNORMAL LOW (ref >60)
Glucose, Bld: 93 mg/dL (ref 70–99)
Phosphorus: 3.7 mg/dL (ref 2.5–4.6)
Potassium: 3.7 mmol/L (ref 3.5–5.1)
Sodium: 135 mmol/L (ref 135–145)

## 2023-11-20 MED ORDER — HEPARIN SODIUM (PORCINE) 1000 UNIT/ML IJ SOLN
3800.0000 [IU] | Freq: Once | INTRAMUSCULAR | Status: AC
  Administered 2023-11-20: 3800 [IU]

## 2023-11-20 MED ORDER — HEPARIN SODIUM (PORCINE) 1000 UNIT/ML IJ SOLN
INTRAMUSCULAR | Status: AC
  Filled 2023-11-20: qty 4

## 2023-11-20 MED ORDER — SODIUM CHLORIDE 0.9 % IV SOLN
750.0000 mg | Freq: Every day | INTRAVENOUS | Status: DC
  Filled 2023-11-20: qty 7.5

## 2023-11-20 NOTE — TOC Initial Note (Addendum)
Transition of Care [facility]) - Initial/Assessment Note    Patient Details  Name: Carolyn Russo MRN: [id number] Date of Birth: 1951/08/22  Transition of Care [facility]) CM/SW Contact:    Marliss Coots, LCSW Phone Number: 11/20/2023, 1:49 PM  Clinical Narrative:                  1:49 PM Per chart review, patient is currently oriented x3 and was admitted from [facility] STR. CSW called patient's spouse, Carolyn Russo, regarding SNF. Channing Mutters confirmed patient is to return to [facility] upon discharge and consented CSW to relay plan to patient and SNF.  4:29 PM CSW attempted to call patient's room phone but there was no response and a voicemail could not be left.  Expected Discharge Plan: Skilled Nursing Facility     Patient Goals and CMS Choice            Expected Discharge Plan and Services In-house Referral: Clinical Social Work     Living arrangements for the past 2 months: Single Family Home, Skilled Nursing Facility                                      Prior Living Arrangements/Services Living arrangements for the past 2 months: Single Family Home, Skilled Nursing Facility Lives with:: Spouse, Facility Resident Patient language and need for interpreter reviewed:: Yes        Need for Family Participation in Patient Care: Yes (Comment) Care giver support system in place?: Yes (comment)   Criminal Activity/Legal Involvement Pertinent to Current Situation/Hospitalization: No - Comment as needed  Activities of Daily Living   ADL Screening (condition at time of admission) Independently performs ADLs?: Yes (appropriate for developmental age) Is the patient deaf or have difficulty hearing?: No Does the patient have difficulty seeing, even when wearing glasses/contacts?: No Does the patient have difficulty concentrating, remembering, or making decisions?: No  Permission Sought/Granted Permission sought to share information with : Facility Medical sales representative,  Family Supports Permission granted to share information with : No (Contact information on chart, from [facility])  Share Information with NAME: Dr Blake Divine  Permission granted to share info w AGENCY: [facility] SNF  Permission granted to share info w Relationship: Spouse  Permission granted to share info w Contact Information: [phone]  Emotional Assessment       Orientation: : Oriented to Self, Oriented to Place, Oriented to  Time Alcohol / Substance Use: Not Applicable Psych Involvement: No (comment)  Admission diagnosis:  Pneumonia [J18.9] Patient Active Problem List   Diagnosis Date Noted   Troponin I above reference range 11/16/2023   CAP (community acquired pneumonia) 11/16/2023   Pneumonia 11/16/2023   Elevated troponin 10/24/2023   Hypokalemia 10/24/2023   Memory loss 10/24/2023   History of seizure disorder 10/24/2023   GERD (gastroesophageal reflux disease) 10/24/2023   Primary insomnia 09/11/2023   Wheelchair dependence 06/09/2023   Hx of right BKA (HCC) 03/10/2023   CHF (congestive heart failure) (HCC) 02/10/2023   Dyspnea 02/09/2023   Acute respiratory distress 02/09/2023   Urinary tract infection without hematuria    Diabetes mellitus due to underlying condition with hypoglycemia without coma, with long-term current use of insulin (HCC)    Seizure disorder (HCC) 06/22/2022   Acute respiratory failure (HCC)    Unresponsiveness    Pressure ulcer 06/07/2022   Acute stroke due to ischemia (HCC) 06/03/2022   Chest pain  06/01/2022   History of CVA with residual deficit 06/01/2022   Seizure (HCC)    Acute encephalopathy    Below-knee amputation of right lower extremity (HCC)    Leukocytosis    Foot infection 05/10/2022   PAD (peripheral artery disease) (HCC) 05/10/2022   Gangrene of right foot (HCC) 04/15/2022   Left hemiparesis (HCC) 09/24/2021   Abnormality of gait 09/24/2021   Pain of right hand 08/06/2021   Steal syndrome of dialysis  vascular access (HCC) 08/06/2021   Subclavian steal syndrome of right subclavian artery 06/25/2021   Right hand weakness 06/25/2021   Stroke-like symptoms 05/06/2021   Bandemia    Cerebrovascular accident (CVA) of right basal ganglia (HCC) 04/20/2021   Hemorrhoids    Basal ganglia infarction (HCC) 04/16/2021   ESRD on dialysis (HCC) 04/16/2021   Hypertensive urgency 03/10/2021   Hilar enlargement    Anemia of chronic disease    Chronic diastolic CHF (congestive heart failure) (HCC) 03/09/2021   CKD (chronic kidney disease), stage V (HCC) 03/08/2021   Diabetic retinopathy (HCC) 03/08/2021   Hyperglycemia due to type 2 diabetes mellitus (HCC) 03/08/2021   Pure hypercholesterolemia 03/08/2021   Anemia in chronic kidney disease 09/24/2020   Uncontrolled type 2 diabetes mellitus with hyperglycemia (HCC) 03/19/2019   Osteopenia 09/04/2018   Migraine 09/04/2018   Asthma 09/04/2018   Pseudophakia of both eyes 07/17/2018   Pseudophakia of left eye 07/03/2018   Open angle with borderline findings and high glaucoma risk in left eye 07/03/2018   Age-related nuclear cataract of right eye 07/03/2018   CAD (coronary artery disease), native coronary artery 04/27/2017   Abnormal nuclear stress test 04/04/2017   Shortness of breath 03/09/2017   Appendicitis    Age-related hypermature cataract of both eyes 12/20/2016   Uterine leiomyoma 11/27/2012   Dyslipidemia (high LDL; low HDL) 11/25/2011   Obesity (BMI 30-39.9) 11/25/2011   Hypertensive disorder 11/21/2011   Type 2 diabetes mellitus (HCC) 11/21/2011   Abnormal EKG 11/21/2011   PCP:  Annita Brod, MD Pharmacy:   [facility] [phone] [place], [place] - [address] AT [facility] [address] [facility] [address] [place] [address] Phone: [phone] Fax: [phone]     Social Drivers of Health (SDOH) Social History: SDOH Screenings   Food Insecurity: No Food Insecurity (11/17/2023)  Housing: Low Risk   (11/17/2023)  Transportation Needs: No Transportation Needs (11/17/2023)  Utilities: Not At Risk (11/17/2023)  Alcohol Screen: Low Risk  (01/09/2019)  Depression (PHQ2-9): Low Risk  (09/11/2023)  Financial Resource Strain: Low Risk  (09/02/2021)  Physical Activity: Inactive (09/02/2021)  Social Connections: Socially Integrated (11/17/2023)  Stress: Stress Concern Present (09/02/2021)  Tobacco Use: Low Risk  (11/16/2023)   SDOH Interventions:     Readmission Risk Interventions    02/14/2023    4:54 PM  Readmission Risk Prevention Plan  Transportation Screening Complete  PCP or Specialist Appt within 3-5 Days Complete  HRI or Home Care Consult Complete  Social Work Consult for Recovery Care Planning/Counseling Complete  Palliative Care Screening Complete  Medication Review Oceanographer) Complete

## 2023-11-20 NOTE — Progress Notes (Signed)
PROGRESS NOTE    Carolyn Russo  UEA:[id number] DOB: 1951/08/10 DOA: 11/16/2023 PCP: Annita Brod, MD    Brief Narrative: This 73 year old African-American female, past medical history significant for ESRD on HD (MWF), CHF, CVA with residual left-sided weakness, type 2 diabetes, right BKA, and seizure disorder.  Patient presented at [facility] ED on 11/16/23, with confusion and worsening left chest pain radiating to the arm and left-sided shoulder blade pain (back pain).  Patient has had similar presentation (chest and back pain) in the past, with negative workup.  MRI of the brain revealed 1.3 cm acute to early subacute right PICA distribution infarct.  Patient is on aspirin and Brilinta.  Neurology team is directing care.  Workup is still in progress.  Echo revealed EF of 55-60% with moderate left ventricular hypertrophy and mild aortic valve calcification.  Patient troponin was minimally elevated to 200s.  Patient also had a CT chest angiogram done with no evidence of pulmonary embolism.  Intermittent confusion has improved.   Neurology workup is still in progress.  Likely discharge once neurowork-up is completed.    Assessment & Plan:   Principal Problem:   Acute encephalopathy Active Problems:   CAP (community acquired pneumonia)   ESRD on dialysis (HCC)   Troponin I above reference range   Pneumonia  Acute Metabolic encephalopathy, likely multifactorial This could be secondary to COVID and RSV infection along with acute CVA and seizure disorder . Encephalopathy has improved significantly.   Continue supportive care Continue delirium precautions Likely discharge once neurology workup is completed (CVA workup)   Acute CVA: Patient has prior history of CVA with residual left sided weakness. MRI positive for acute to subacute CVA Continue aspirin and Brilinta   Continue statin LDL 37, way below goal  <70, TSH 1.5 WNL Neurology input is highly appreciated.   Seizure disorder : EEG  findings noted.  No seizures were seen throughout the recording. Continue Keppra 750 mg twice daily and additional dose after hemodialysis Continue seizure precautions.   Pneumonia due to COVID and RSV along with possible bacterial superinfection: Complete course of antibiotics (ceftriaxone and azithromycin) Continue tapering steroid. PPI for GI prophylaxis Continue Breo Ellipta inhaler and DuoNeb every 6 hourly Mucinex 600 mg p.o. twice daily Procalcitonin 0.86 WBC count within normal range. Continue airborne isolation.   ESRD on hemodialysis, MWF schedule: Continue hemodialysis Monday, Wednesday and Friday.   Diabetes mellitus type 2 Continue NovoLog sliding scale   DVT prophylaxis: Heparin sq Code Status: Full code Family Communication:No family at bed side Disposition Plan:   Status is: Inpatient Remains inpatient appropriate because: Severity of illness.    Consultants:  Neurology Nephrology  Procedures:  Antimicrobials: Anti-infectives (From admission, onward)    Start     Dose/Rate Route Frequency Ordered Stop   11/16/23 2300  azithromycin (ZITHROMAX) 500 mg in sodium chloride 0.9 % 250 mL IVPB        500 mg 250 mL/hr over 60 Minutes Intravenous Every 24 hours 11/16/23 2112 11/18/23 2300   11/16/23 2200  cefTRIAXone (ROCEPHIN) 2 g in sodium chloride 0.9 % 100 mL IVPB        2 g 200 mL/hr over 30 Minutes Intravenous Every 24 hours 11/16/23 2112          Subjective: Patient was seen and examined at bedside.  Overnight events noted. Patient reports feeling better,  She was sitting comfortably on the recliner,  states she is feeling hungry and asks for the food.  Objective: Vitals:  11/20/23 0808 11/20/23 1004 11/20/23 1053 11/20/23 1100  BP: (!) 164/83 (!) 151/79 (!) 144/63 (!) 142/61  Pulse: 80     Resp: 14   17  Temp: 97.8 F (36.6 C)   97.8 F (36.6 C)  TempSrc: Oral   Oral  SpO2: 100%   97%  Weight:      Height:        Intake/Output Summary  (Last 24 hours) at 11/20/2023 1325 Last data filed at 11/20/2023 0110 Gross per 24 hour  Intake 360 ml  Output 0 ml  Net 360 ml   Filed Weights   11/17/23 1900 11/18/23 0500  Weight: 79.5 kg 79.5 kg    Examination:  General exam: Appears calm and comfortable, deconditioned, not in any acute distress. Respiratory system: Clear to auscultation. Respiratory effort normal.  RR 16 Cardiovascular system: S1 & S2 heard, RRR. No JVD, murmurs, rubs, gallops or clicks. Gastrointestinal system: Abdomen is non distended, soft and non tender.  Normal bowel sounds heard. Central nervous system: Alert and oriented X 3. No focal neurological deficits. Extremities: No edema, no cyanosis, no clubbing Skin: No rashes, lesions or ulcers Psychiatry: Judgement and insight appear normal. Mood & affect appropriate.     Data Reviewed: I have personally reviewed following labs and imaging studies  CBC: Recent Labs  Lab 11/16/23 1044 11/16/23 1657 11/17/23 0012 11/17/23 0349 11/18/23 0205 11/19/23 0822 11/20/23 0733  WBC 9.0  --   --  8.0 9.0 8.3 9.6  NEUTROABS  --   --   --   --   --   --  6.6  HGB 9.9*   < > 9.9* 10.3* 10.5* 10.1* 10.7*  HCT 29.3*   < > 29.0* 30.3* 30.9* 30.3* 32.0*  MCV 87.7  --   --  87.6 87.3 87.1 86.7  PLT 284  --   --  301 294 321 338   < > = values in this interval not displayed.   Basic Metabolic Panel: Recent Labs  Lab 11/16/23 1044 11/16/23 1657 11/17/23 0012 11/17/23 0349 11/17/23 1200 11/18/23 0205 11/19/23 0822 11/20/23 0733  NA 136   < > 137 139  --  136 138 135  K 3.3*   < > 5.2* 4.1  --  3.7 3.7 3.7  CL 93*  --   --  98  --  98 99 97*  CO2 28  --   --  26  --  26 25 24   GLUCOSE 106*  --   --  117*  --  98 129* 93  BUN 19  --   --  22  --  11 29* 39*  CREATININE 4.79*  --   --  5.68*  --  3.15* 5.45* 6.40*  CALCIUM 8.0*  --   --  7.9*  --  8.3* 8.1* 8.0*  MG  --   --   --   --   --  1.9 2.0  --   PHOS  --   --   --   --  4.7*  --   --  3.7   < > =  values in this interval not displayed.   GFR: Estimated Creatinine Clearance: 7.6 mL/min (A) (by C-G formula based on SCr of 6.4 mg/dL (H)). Liver Function Tests: Recent Labs  Lab 11/16/23 2200 11/20/23 0733  AST 18  --   ALT 12  --   ALKPHOS 110  --   BILITOT 0.7  --  PROT 7.3  --   ALBUMIN 2.9* 2.9*   No results for input(s): "LIPASE", "AMYLASE" in the last 168 hours. Recent Labs  Lab 11/16/23 2318  AMMONIA 17   Coagulation Profile: Recent Labs  Lab 11/17/23 0349  INR 1.1   Cardiac Enzymes: No results for input(s): "CKTOTAL", "CKMB", "CKMBINDEX", "TROPONINI" in the last 168 hours. BNP (last 3 results) No results for input(s): "PROBNP" in the last 8760 hours. HbA1C: No results for input(s): "HGBA1C" in the last 72 hours. CBG: Recent Labs  Lab 11/19/23 1208 11/19/23 1719 11/19/23 2139 11/20/23 0643 11/20/23 1050  GLUCAP 134* 207* 186* 101* 124*   Lipid Profile: No results for input(s): "CHOL", "HDL", "LDLCALC", "TRIG", "CHOLHDL", "LDLDIRECT" in the last 72 hours. Thyroid Function Tests: No results for input(s): "TSH", "T4TOTAL", "FREET4", "T3FREE", "THYROIDAB" in the last 72 hours. Anemia Panel: No results for input(s): "VITAMINB12", "FOLATE", "FERRITIN", "TIBC", "IRON", "RETICCTPCT" in the last 72 hours. Sepsis Labs: Recent Labs  Lab 11/17/23 0013 11/17/23 0930  PROCALCITON  --  0.86  LATICACIDVEN 0.9  --     Recent Results (from the past 240 hours)  Respiratory (~20 pathogens) panel by PCR     Status: Abnormal   Collection Time: 11/16/23  8:57 PM   Specimen: Nasopharyngeal Swab; Respiratory  Result Value Ref Range Status   Adenovirus NOT DETECTED NOT DETECTED Final   Coronavirus 229E NOT DETECTED NOT DETECTED Final    Comment: (NOTE) The Coronavirus on the Respiratory Panel, DOES NOT test for the novel  Coronavirus (2019 nCoV)    Coronavirus HKU1 NOT DETECTED NOT DETECTED Final   Coronavirus NL63 NOT DETECTED NOT DETECTED Final   Coronavirus  OC43 NOT DETECTED NOT DETECTED Final   Metapneumovirus NOT DETECTED NOT DETECTED Final   Rhinovirus / Enterovirus NOT DETECTED NOT DETECTED Final   Influenza A NOT DETECTED NOT DETECTED Final   Influenza B NOT DETECTED NOT DETECTED Final   Parainfluenza Virus 1 NOT DETECTED NOT DETECTED Final   Parainfluenza Virus 2 NOT DETECTED NOT DETECTED Final   Parainfluenza Virus 3 NOT DETECTED NOT DETECTED Final   Parainfluenza Virus 4 NOT DETECTED NOT DETECTED Final   Respiratory Syncytial Virus DETECTED (A) NOT DETECTED Final   Bordetella pertussis NOT DETECTED NOT DETECTED Final   Bordetella Parapertussis NOT DETECTED NOT DETECTED Final   Chlamydophila pneumoniae NOT DETECTED NOT DETECTED Final   Mycoplasma pneumoniae NOT DETECTED NOT DETECTED Final    Comment: Performed at [facility], [address] [place], [place] [address]  SARS Coronavirus 2 by RT PCR (hospital order, performed in [name] hospital lab) *cepheid single result test* Anterior Nasal Swab     Status: Abnormal   Collection Time: 11/16/23  8:57 PM   Specimen: Anterior Nasal Swab  Result Value Ref Range Status   SARS Coronavirus 2 by RT PCR POSITIVE (A) NEGATIVE Final    Comment: Performed at [facility], [address] [place], [place] [address]  Culture, blood (Routine X 2) w Reflex to ID Panel     Status: None (Preliminary result)   Collection Time: 11/16/23 10:45 PM   Specimen: BLOOD  Result Value Ref Range Status   Specimen Description BLOOD SITE NOT SPECIFIED  Final   Special Requests   Final    BOTTLES DRAWN AEROBIC AND ANAEROBIC Blood Culture results may not be optimal due to an inadequate volume of blood received in culture bottles   Culture   Final    NO GROWTH 4  DAYS Performed at [facility], [address], [place], [place] [address]    Report Status PENDING  Incomplete  Culture, blood (Routine X 2) w Reflex to ID Panel     Status: None (Preliminary result)   Collection Time: 11/16/23  10:48 PM   Specimen: BLOOD  Result Value Ref Range Status   Specimen Description BLOOD SITE NOT SPECIFIED  Final   Special Requests   Final    BOTTLES DRAWN AEROBIC AND ANAEROBIC Blood Culture results may not be optimal due to an inadequate volume of blood received in culture bottles   Culture   Final    NO GROWTH 4 DAYS Performed at [facility], [address], [place], [place] [address]    Report Status PENDING  Incomplete  MRSA Next Gen by PCR, Nasal     Status: None   Collection Time: 11/17/23  2:23 PM   Specimen: Nasal Mucosa; Nasal Swab  Result Value Ref Range Status   MRSA by PCR Next Gen NOT DETECTED NOT DETECTED Final    Comment: (NOTE) The GeneXpert MRSA Assay (FDA approved for NASAL specimens only), is one component of a comprehensive MRSA colonization surveillance program. It is not intended to diagnose MRSA infection nor to guide or monitor treatment for MRSA infections. Test performance is not FDA approved in patients less than 79 years old. Performed at [facility], [address], [place], [place] [address]    Radiology Studies: VAS US CAROTID Result Date: 11/19/2023 Carotid Arterial Duplex Study Patient Name:  Carolyn Russo [facility]  Date of Exam:   11/19/2023 Medical Rec #: [id number]       Accession #:    [id number] Date of Birth: May 11, 1951       Patient Gender: F Patient Age:   70 years Exam Location:  [facility] Procedure:      VAS US CAROTID Referring Phys: Gillis Santa --------------------------------------------------------------------------------  Indications:       CVA. Risk Factors:      Hypertension, hyperlipidemia, Diabetes. Other Factors:     Covid and RSV positive. ESRD, on dialysis. Right BKA. Seizure                    disorder. Limitations        Today's exam was limited due to the patient's respiratory                    variation and the patient's inability or unwillingness to                    cooperate. Comparison Study:  Prior carotid  duplex done 06/04/22 indicated bilateral 1-39%                    ICA stenosis Performing Technologist: Sherren Kerns RVS  Examination Guidelines: A complete evaluation includes B-mode imaging, spectral Doppler, color Doppler, and power Doppler as needed of all accessible portions of each vessel. Bilateral testing is considered an integral part of a complete examination. Limited examinations for reoccurring indications may be performed as noted.  Right Carotid Findings: +----------+--------+--------+--------+------------------+--------+           PSV cm/sEDV cm/sStenosisPlaque DescriptionComments +----------+--------+--------+--------+------------------+--------+ CCA Distal77      14                                         +----------+--------+--------+--------+------------------+--------+  ECA       75      10                                         +----------+--------+--------+--------+------------------+--------+ Unable to adequately visualize vessels secondary to noncooperative patient Left Carotid Findings: +----------+--------+--------+--------+------------------+------------------+           PSV cm/sEDV cm/sStenosisPlaque DescriptionComments           +----------+--------+--------+--------+------------------+------------------+ CCA Prox  97      11                                intimal thickening +----------+--------+--------+--------+------------------+------------------+ CCA Distal67      11                                intimal thickening +----------+--------+--------+--------+------------------+------------------+ ICA Prox  99      19              heterogenous                         +----------+--------+--------+--------+------------------+------------------+ ICA Mid   96      22                                                   +----------+--------+--------+--------+------------------+------------------+ ICA Distal78      19                                                    +----------+--------+--------+--------+------------------+------------------+ ECA       83      9                                                    +----------+--------+--------+--------+------------------+------------------+ +----------+--------+--------+--------+-------------------+           PSV cm/sEDV cm/sDescribeArm Pressure (mmHG) +----------+--------+--------+--------+-------------------+ [id number]                                         +----------+--------+--------+--------+-------------------+ +---------+--------+--+--------+--+ VertebralPSV cm/s66EDV cm/s10 +---------+--------+--+--------+--+   Summary: Right Carotid: Unable to assess. Left Carotid: Velocities in the left ICA are consistent with a 1-39% stenosis. Vertebrals:  Left vertebral artery demonstrates antegrade flow. Subclavians: Normal flow hemodynamics were seen in the left subclavian artery. *See table(s) above for measurements and observations.     Preliminary    Scheduled Meds:   stroke: early stages of recovery book   Does not apply Once   aspirin EC  81 mg Oral Daily   bisacodyl  10 mg Oral QHS   Chlorhexidine Gluconate Cloth  6 each Topical Q0600   Chlorhexidine Gluconate Cloth  6 each Topical Q0600   fluticasone furoate-vilanterol  1 puff Inhalation Daily   guaiFENesin  600 mg Oral BID   heparin injection (subcutaneous)  5,000 Units Subcutaneous Q8H   heparin sodium (porcine)       hydrALAZINE  100 mg Oral Q8H   insulin aspart  0-5 Units Subcutaneous QHS   insulin aspart  0-6 Units Subcutaneous TID WC   lidocaine  1 patch Transdermal Q24H   methylPREDNISolone (SOLU-MEDROL) injection  40 mg Intravenous Q24H   pantoprazole (PROTONIX) IV  40 mg Intravenous Q24H   polyethylene glycol  17 g Oral BID   rosuvastatin  20 mg Oral QHS   sevelamer carbonate  800 mg Oral TID WC   sodium chloride flush  3 mL Intravenous Q12H   ticagrelor  90 mg Oral BID    Continuous Infusions:  cefTRIAXone (ROCEPHIN)  IV 2 g (11/19/23 2133)   levETIRAcetam 380 mg (11/17/23 1724)   levETIRAcetam 750 mg (11/20/23 0847)     LOS: 4 days    Time spent: 50 mins    Willeen Niece, MD [facility]   If 7PM-7AM, please contact night-coverage

## 2023-11-20 NOTE — Progress Notes (Signed)
Nephrology Follow-Up Consult note   Assessment/Recommendations: Carolyn Russo is a/an 73 y.o. female with a past medical history significant for ESRD, admitted for AMS w/ AHRF, CAP, COVID, RSV.       Dialysis Orders: MWF [facility]  4h   B350    75.5kg   2/2 bath   R fem TDC  Heparin 2600 + 1400 midrun - last HD 1/22, getting to dry wt - 3-4 drops in dry wt in last 3 wks - venofer 100 qhd thru 1/31 - mircera 150 q2, last 1/17, due 1/31   Assessment/Plan: AHRF/ CAP -  +COVID and RSV. Some volume excess. Abx per primary. UF w/ HF AMS - pt was lethargic, possible new CVA by imaging. Seen by neurology - altered MS could be related to acute medical illness, TME, post-ictal. Seems to be improving.  ESRD - on HD MWF, maintain schedule HTN - pt BP's were low on arrival, now elevated, resume home antihypertensives as needed Volume - Still above EDW, UF with HD as tolerated Anemia of esrd - Hfb at goal. ESA due on 1/31. Secondary hyperparathyroidism - Cont home binders H/o seizure d/o - on Keppra   Recommendations conveyed to primary service.    [facility] 11/20/2023 9:29 AM  ___________________________________________________________  CC: AMS  Interval History/Subjective: Per nursing patient slept better last night.  Patient denies any complaints this morning.   Medications:  Current Facility-Administered Medications  Medication Dose Route Frequency Provider Last Rate Last Admin    stroke: early stages of recovery book   Does not apply Once Nolberto Hanlon, MD       acetaminophen (TYLENOL) tablet 650 mg  650 mg Oral Q6H PRN Nolberto Hanlon, MD   650 mg at 11/20/23 4098   Or   acetaminophen (TYLENOL) suppository 650 mg  650 mg Rectal Q6H PRN Nolberto Hanlon, MD       aspirin EC tablet 81 mg  81 mg Oral Daily Little Ishikawa, MD   81 mg at 11/20/23 0851   bisacodyl (DULCOLAX) EC tablet 10 mg  10 mg Oral QHS Gillis Santa, MD   10 mg at 11/19/23 2119    bisacodyl (DULCOLAX) suppository 10 mg  10 mg Rectal Daily PRN Gillis Santa, MD       cefTRIAXone (ROCEPHIN) 2 g in sodium chloride 0.9 % 100 mL IVPB  2 g Intravenous Q24H Nolberto Hanlon, MD 200 mL/hr at 11/19/23 2133 2 g at 11/19/23 2133   Chlorhexidine Gluconate Cloth 2 % PADS 6 each  6 each Topical Q0600 Gillis Santa, MD   6 each at 11/20/23 0851   Chlorhexidine Gluconate Cloth 2 % PADS 6 each  6 each Topical Q0600 Oretha Milch, PA-C   6 each at 11/19/23 1048   chlorpheniramine-HYDROcodone (TUSSIONEX) 10-8 MG/5ML suspension 5 mL  5 mL Oral Q12H PRN Gillis Santa, MD   5 mL at 11/20/23 0040   fluticasone furoate-vilanterol (BREO ELLIPTA) 200-25 MCG/ACT 1 puff  1 puff Inhalation Daily Nolberto Hanlon, MD   1 puff at 11/20/23 0852   guaiFENesin (MUCINEX) 12 hr tablet 600 mg  600 mg Oral BID Gillis Santa, MD   600 mg at 11/20/23 0849   heparin injection 5,000 Units  5,000 Units Subcutaneous Q8H Gillis Santa, MD   5,000 Units at 11/20/23 0644   heparin sodium (porcine) 1000 UNIT/ML injection            hydrALAZINE (APRESOLINE) injection 10 mg  10 mg Intravenous  Q4H PRN Nolberto Hanlon, MD   10 mg at 11/19/23 1100   hydrALAZINE (APRESOLINE) tablet 100 mg  100 mg Oral Q8H Berton Mount I, MD   100 mg at 11/20/23 8295   insulin aspart (novoLOG) injection 0-5 Units  0-5 Units Subcutaneous QHS Nolberto Hanlon, MD   2 Units at 11/18/23 2103   insulin aspart (novoLOG) injection 0-6 Units  0-6 Units Subcutaneous TID [facility] Nolberto Hanlon, MD   2 Units at 11/19/23 1730   labetalol (NORMODYNE) injection 20 mg  20 mg Intravenous Q2H PRN Nolberto Hanlon, MD   20 mg at 11/19/23 6213   levETIRAcetam (KEPPRA) 380 mg in sodium chloride 0.9 % 100 mL IVPB  380 mg Intravenous Q M,W,F Howerter, Justin B, DO 415.2 mL/hr at 11/17/23 1724 380 mg at 11/17/23 1724   levETIRAcetam (KEPPRA) 750 mg in sodium chloride 0.9 % 100 mL IVPB  750 mg Intravenous Q12H Micki Riley, MD 430 mL/hr at 11/20/23 0847 750 mg at 11/20/23 0847   lidocaine  (LIDODERM) 5 % 1 patch  1 patch Transdermal Q24H Berton Mount I, MD   1 patch at 11/19/23 2100   LORazepam (ATIVAN) tablet 0.5 mg  0.5 mg Oral Once PRN Howerter, Justin B, DO       methylPREDNISolone sodium succinate (SOLU-MEDROL) 40 mg/mL injection 40 mg  40 mg Intravenous Q24H Gillis Santa, MD   40 mg at 11/20/23 0850   nitroGLYCERIN (NITROSTAT) SL tablet 0.4 mg  0.4 mg Sublingual Q30 min PRN Howerter, Justin B, DO   0.4 mg at 11/17/23 0414   pantoprazole (PROTONIX) injection 40 mg  40 mg Intravenous Q24H Nolberto Hanlon, MD   40 mg at 11/19/23 2118   phenylephrine ((USE for PREPARATION-H)) 0.25 % suppository 1 suppository  1 suppository Rectal BID PRN Gillis Santa, MD       polyethylene glycol (MIRALAX / GLYCOLAX) packet 17 g  17 g Oral BID Gillis Santa, MD   17 g at 11/19/23 2117   rosuvastatin (CRESTOR) tablet 20 mg  20 mg Oral QHS Gillis Santa, MD   20 mg at 11/19/23 2119   sevelamer carbonate (RENVELA) tablet 800 mg  800 mg Oral TID WC Delano Metz, MD   800 mg at 11/19/23 1703   sodium chloride flush (NS) 0.9 % injection 3 mL  3 mL Intravenous Q12H Nolberto Hanlon, MD   3 mL at 11/20/23 0850   sorbitol, magnesium hydroxide, mineral oil, glycerin (SMOG) enema  960 mL Rectal Daily PRN Gillis Santa, MD       ticagrelor [facility]) tablet 90 mg  90 mg Oral BID Gillis Santa, MD   90 mg at 11/20/23 0865   witch hazel-glycerin (TUCKS) pad   Topical PRN Gillis Santa, MD          Review of Systems: 10 systems reviewed and negative except per interval history/subjective  Physical Exam: Vitals:   11/20/23 0415 11/20/23 0808  BP: (!) 144/72 (!) 164/83  Pulse:  80  Resp:  14  Temp: 97.8 F (36.6 C) 97.8 F (36.6 C)  SpO2:  100%   No intake/output data recorded.  Intake/Output Summary (Last 24 hours) at 11/20/2023 7846 Last data filed at 11/20/2023 0110 Gross per 24 hour  Intake 360 ml  Output 0 ml  Net 360 ml   Constitutional: Chronically ill-appearing, no acute distress ENMT:  ears and nose without scars or lesions, MMM CV: normal rate, no edema Respiratory: clear to auscultation, normal work of breathing Gastrointestinal: soft,  non-tender, nondistended Skin: no visible lesions or rashes Psych: Awake and alert, judgment/insight impaired   Test Results I personally reviewed new and old clinical labs and radiology tests Lab Results  Component Value Date   NA 135 11/20/2023   K 3.7 11/20/2023   CL 97 (L) 11/20/2023   CO2 24 11/20/2023   BUN 39 (H) 11/20/2023   CREATININE 6.40 (H) 11/20/2023   GLU 366 06/19/2020   CALCIUM 8.0 (L) 11/20/2023   ALBUMIN 2.9 (L) 11/20/2023   PHOS 3.7 11/20/2023    CBC Recent Labs  Lab 11/18/23 0205 11/19/23 0822 11/20/23 0733  WBC 9.0 8.3 9.6  NEUTROABS  --   --  6.6  HGB 10.5* 10.1* 10.7*  HCT 30.9* 30.3* 32.0*  MCV 87.3 87.1 86.7  PLT 294 321 338

## 2023-11-20 NOTE — NC FL2 (Signed)
[place] MEDICAID FL2 LEVEL OF CARE FORM     IDENTIFICATION  Patient Name: Carolyn Russo Birthdate: April 23, 1951 Sex: female Admission Date (Current Location): 11/16/2023  [facility] and [place] Number:  Producer, television/film/video and Address:  [facility] [place], [address], [place], [place] [address]      Provider Number: [id number]  Attending Physician Name and Address:  Willeen Niece, MD  Relative Name and Phone Number:  Dr. Blake Divine.; Spouse; [phone]    Current Level of Care: Hospital Recommended Level of Care: Skilled Nursing Facility Prior Approval Number:    Date Approved/Denied:   PASRR Number: [id number]  Discharge Plan: SNF    Current Diagnoses: Patient Active Problem List   Diagnosis Date Noted   Troponin I above reference range 11/16/2023   CAP (community acquired pneumonia) 11/16/2023   Pneumonia 11/16/2023   Elevated troponin 10/24/2023   Hypokalemia 10/24/2023   Memory loss 10/24/2023   History of seizure disorder 10/24/2023   GERD (gastroesophageal reflux disease) 10/24/2023   Primary insomnia 09/11/2023   Wheelchair dependence 06/09/2023   Hx of right BKA (HCC) 03/10/2023   CHF (congestive heart failure) (HCC) 02/10/2023   Dyspnea 02/09/2023   Acute respiratory distress 02/09/2023   Urinary tract infection without hematuria    Diabetes mellitus due to underlying condition with hypoglycemia without coma, with long-term current use of insulin (HCC)    Seizure disorder (HCC) 06/22/2022   Acute respiratory failure (HCC)    Unresponsiveness    Pressure ulcer 06/07/2022   Acute stroke due to ischemia (HCC) 06/03/2022   Chest pain 06/01/2022   History of CVA with residual deficit 06/01/2022   Seizure (HCC)    Acute encephalopathy    Below-knee amputation of right lower extremity (HCC)    Leukocytosis    Foot infection 05/10/2022   PAD (peripheral artery disease) (HCC) 05/10/2022   Gangrene of right foot (HCC) 04/15/2022    Left hemiparesis (HCC) 09/24/2021   Abnormality of gait 09/24/2021   Pain of right hand 08/06/2021   Steal syndrome of dialysis vascular access (HCC) 08/06/2021   Subclavian steal syndrome of right subclavian artery 06/25/2021   Right hand weakness 06/25/2021   Stroke-like symptoms 05/06/2021   Bandemia    Cerebrovascular accident (CVA) of right basal ganglia (HCC) 04/20/2021   Hemorrhoids    Basal ganglia infarction (HCC) 04/16/2021   ESRD on dialysis (HCC) 04/16/2021   Hypertensive urgency 03/10/2021   Hilar enlargement    Anemia of chronic disease    Chronic diastolic CHF (congestive heart failure) (HCC) 03/09/2021   CKD (chronic kidney disease), stage V (HCC) 03/08/2021   Diabetic retinopathy (HCC) 03/08/2021   Hyperglycemia due to type 2 diabetes mellitus (HCC) 03/08/2021   Pure hypercholesterolemia 03/08/2021   Anemia in chronic kidney disease 09/24/2020   Uncontrolled type 2 diabetes mellitus with hyperglycemia (HCC) 03/19/2019   Osteopenia 09/04/2018   Migraine 09/04/2018   Asthma 09/04/2018   Pseudophakia of both eyes 07/17/2018   Pseudophakia of left eye 07/03/2018   Open angle with borderline findings and high glaucoma risk in left eye 07/03/2018   Age-related nuclear cataract of right eye 07/03/2018   CAD (coronary artery disease), native coronary artery 04/27/2017   Abnormal nuclear stress test 04/04/2017   Shortness of breath 03/09/2017   Appendicitis    Age-related hypermature cataract of both eyes 12/20/2016   Uterine leiomyoma 11/27/2012   Dyslipidemia (high LDL; low HDL) 11/25/2011   Obesity (BMI 30-39.9) 11/25/2011  Hypertensive disorder 11/21/2011   Type 2 diabetes mellitus (HCC) 11/21/2011   Abnormal EKG 11/21/2011    Orientation RESPIRATION BLADDER Height & Weight     Self, Time, Place  O2 (2L nasal cannula) Continent Weight:  (Not able to get bed wt scale is broken.) Height:  5\' 3"  (160 cm)  BEHAVIORAL SYMPTOMS/MOOD NEUROLOGICAL BOWEL  NUTRITION STATUS      Incontinent Diet (Please see dc summary)  AMBULATORY STATUS COMMUNICATION OF NEEDS Skin   Extensive Assist Verbally Normal                       Personal Care Assistance Level of Assistance  Bathing, Feeding, Dressing Bathing Assistance: Maximum assistance Feeding assistance: Maximum assistance Dressing Assistance: Maximum assistance     Functional Limitations Info  Sight, Hearing Sight Info: Impaired (R and L) Hearing Info: Impaired (R and L)      SPECIAL CARE FACTORS FREQUENCY  PT (By licensed PT), OT (By licensed OT), Speech therapy     PT Frequency: 5x OT Frequency: 5x     Speech Therapy Frequency: 5x      Contractures Contractures Info: Not present    Additional Factors Info  Code Status, Allergies Code Status Info: Full Code Allergies Info: Almond Oil; Peanuts; Gadolinium Derivatives (Gadolinium- Containing Contract Media); Januvia (Sitagliptin); Pork-derived Products           Current Medications (11/20/2023):  This is the current hospital active medication list Current Facility-Administered Medications  Medication Dose Route Frequency Provider Last Rate Last Admin    stroke: early stages of recovery book   Does not apply Once Nolberto Hanlon, MD       acetaminophen (TYLENOL) tablet 650 mg  650 mg Oral Q6H PRN Nolberto Hanlon, MD   650 mg at 11/20/23 1110   Or   acetaminophen (TYLENOL) suppository 650 mg  650 mg Rectal Q6H PRN Nolberto Hanlon, MD       aspirin EC tablet 81 mg  81 mg Oral Daily Little Ishikawa, MD   81 mg at 11/20/23 0851   bisacodyl (DULCOLAX) EC tablet 10 mg  10 mg Oral QHS Gillis Santa, MD   10 mg at 11/19/23 2119   bisacodyl (DULCOLAX) suppository 10 mg  10 mg Rectal Daily PRN Gillis Santa, MD       cefTRIAXone (ROCEPHIN) 2 g in sodium chloride 0.9 % 100 mL IVPB  2 g Intravenous Q24H Nolberto Hanlon, MD 200 mL/hr at 11/19/23 2133 2 g at 11/19/23 2133   Chlorhexidine Gluconate Cloth 2 % PADS 6 each  6 each Topical Q0600  Gillis Santa, MD   6 each at 11/20/23 0851   Chlorhexidine Gluconate Cloth 2 % PADS 6 each  6 each Topical Q0600 Oretha Milch, PA-C   6 each at 11/19/23 1048   chlorpheniramine-HYDROcodone (TUSSIONEX) 10-8 MG/5ML suspension 5 mL  5 mL Oral Q12H PRN Gillis Santa, MD   5 mL at 11/20/23 1352   fluticasone furoate-vilanterol (BREO ELLIPTA) 200-25 MCG/ACT 1 puff  1 puff Inhalation Daily Nolberto Hanlon, MD   1 puff at 11/20/23 0852   guaiFENesin (MUCINEX) 12 hr tablet 600 mg  600 mg Oral BID Gillis Santa, MD   600 mg at 11/20/23 0849   heparin injection 5,000 Units  5,000 Units Subcutaneous Q8H Gillis Santa, MD   5,000 Units at 11/20/23 1314   heparin sodium (porcine) 1000 UNIT/ML injection            hydrALAZINE (APRESOLINE)  injection 10 mg  10 mg Intravenous Q4H PRN Nolberto Hanlon, MD   10 mg at 11/19/23 1100   hydrALAZINE (APRESOLINE) tablet 100 mg  100 mg Oral Q8H Berton Mount I, MD   100 mg at 11/20/23 2956   insulin aspart (novoLOG) injection 0-5 Units  0-5 Units Subcutaneous QHS Nolberto Hanlon, MD   2 Units at 11/18/23 2103   insulin aspart (novoLOG) injection 0-6 Units  0-6 Units Subcutaneous TID WC Nolberto Hanlon, MD   2 Units at 11/19/23 1730   labetalol (NORMODYNE) injection 20 mg  20 mg Intravenous Q2H PRN Nolberto Hanlon, MD   20 mg at 11/19/23 2130   levETIRAcetam (KEPPRA) 380 mg in sodium chloride 0.9 % 100 mL IVPB  380 mg Intravenous Q M,W,F Howerter, Justin B, DO 415.2 mL/hr at 11/17/23 1724 380 mg at 11/17/23 1724   [START ON 11/21/2023] levETIRAcetam (KEPPRA) 750 mg in sodium chloride 0.9 % 100 mL IVPB  750 mg Intravenous QHS Gevena Mart A, NP       lidocaine (LIDODERM) 5 % 1 patch  1 patch Transdermal Q24H Berton Mount I, MD   1 patch at 11/19/23 2100   LORazepam (ATIVAN) tablet 0.5 mg  0.5 mg Oral Once PRN Howerter, Justin B, DO       methylPREDNISolone sodium succinate (SOLU-MEDROL) 40 mg/mL injection 40 mg  40 mg Intravenous Q24H Gillis Santa, MD   40 mg at 11/20/23 0850    nitroGLYCERIN (NITROSTAT) SL tablet 0.4 mg  0.4 mg Sublingual Q30 min PRN Howerter, Justin B, DO   0.4 mg at 11/17/23 0414   pantoprazole (PROTONIX) injection 40 mg  40 mg Intravenous Q24H Nolberto Hanlon, MD   40 mg at 11/19/23 2118   phenylephrine ((USE for PREPARATION-H)) 0.25 % suppository 1 suppository  1 suppository Rectal BID PRN Gillis Santa, MD       polyethylene glycol (MIRALAX / GLYCOLAX) packet 17 g  17 g Oral BID Gillis Santa, MD   17 g at 11/19/23 2117   rosuvastatin (CRESTOR) tablet 20 mg  20 mg Oral QHS Gillis Santa, MD   20 mg at 11/19/23 2119   sevelamer carbonate (RENVELA) tablet 800 mg  800 mg Oral TID WC Delano Metz, MD   800 mg at 11/20/23 1314   sodium chloride flush (NS) 0.9 % injection 3 mL  3 mL Intravenous Q12H Nolberto Hanlon, MD   3 mL at 11/20/23 0850   sorbitol, magnesium hydroxide, mineral oil, glycerin (SMOG) enema  960 mL Rectal Daily PRN Gillis Santa, MD       ticagrelor [facility]) tablet 90 mg  90 mg Oral BID Gillis Santa, MD   90 mg at 11/20/23 8657   witch hazel-glycerin (TUCKS) pad   Topical PRN Gillis Santa, MD         Discharge Medications: Please see discharge summary for a list of discharge medications.  Relevant Imaging Results:  Relevant Lab Results:   Additional Information [id number],HD at [facility] on MWF. Pt arrives at 6:20 for 6:40 chair time.  Marliss Coots, LCSW

## 2023-11-20 NOTE — Progress Notes (Signed)
STROKE TEAM PROGRESS NOTE   BRIEF HPI [name] is a 73 y.o. female who presented from her SNF for chest pain, confusion and weakness.  CT head revealed infarct in the inferior right cerebellum which was new since 06/12/2022, MRI brain with acute right inferomedial cerebellar ischemic infarction in the right PICA territory.   SIGNIFICANT HOSPITAL EVENTS 1/24 CT head revealed infarct in the inferior right cerebellum which was new since 06/12/2022, MRI brain with acute right inferomedial cerebellar ischemic infarction in the right PICA territory.  In the ED she was found to have COVID/RSV  INTERIM HISTORY/SUBJECTIVE No family at the bedside.  Patient is sitting up in bedside chair.  Her cough and pneumonia is improving.  WBC count is normal.  Steroids are being tapered.  Carotid ultrasound shows no significant extracranial stenosis. Echo shows ejection fraction 55 to 60%.  Left atrium size is normal. OBJECTIVE  CBC    Component Value Date/Time   WBC 9.6 11/20/2023 0733   RBC 3.69 (L) 11/20/2023 0733   HGB 10.7 (L) 11/20/2023 0733   HGB 9.3 (L) 04/13/2022 1512   HCT 32.0 (L) 11/20/2023 0733   HCT 27.6 (L) 04/13/2022 1512   PLT 338 11/20/2023 0733   PLT 341 04/13/2022 1512   MCV 86.7 11/20/2023 0733   MCV 89 04/13/2022 1512   MCH 29.0 11/20/2023 0733   MCHC 33.4 11/20/2023 0733   RDW 18.7 (H) 11/20/2023 0733   RDW 16.1 (H) 04/13/2022 1512   LYMPHSABS 1.8 11/20/2023 0733   LYMPHSABS 2.4 04/13/2022 1512   MONOABS 1.2 (H) 11/20/2023 0733   EOSABS 0.0 11/20/2023 0733   EOSABS 0.4 04/13/2022 1512   BASOSABS 0.0 11/20/2023 0733   BASOSABS 0.1 04/13/2022 1512    BMET    Component Value Date/Time   NA 135 11/20/2023 0733   NA 142 04/13/2022 1512   K 3.7 11/20/2023 0733   CL 97 (L) 11/20/2023 0733   CO2 24 11/20/2023 0733   GLUCOSE 93 11/20/2023 0733   BUN 39 (H) 11/20/2023 0733   BUN 12 04/13/2022 1512   CREATININE 6.40 (H) 11/20/2023 0733   CREATININE 0.84 10/13/2011  0903   CALCIUM 8.0 (L) 11/20/2023 0733   EGFR 12 (L) 04/13/2022 1512   GFRNONAA 6 (L) 11/20/2023 0733   GFRNONAA 75 10/13/2011 0903    IMAGING past 24 hours No results found.   Vitals:   11/20/23 0808 11/20/23 1004 11/20/23 1053 11/20/23 1100  BP: (!) 164/83 (!) 151/79 (!) 144/63 (!) 142/61  Pulse: 80     Resp: 14   17  Temp: 97.8 F (36.6 C)   97.8 F (36.6 C)  TempSrc: Oral   Oral  SpO2: 100%   97%  Weight:      Height:         PHYSICAL EXAM General: critically ill elderly African-American lady in no acute distress Psych:  Mood and affect appropriate for situation CV: Regular rate and rhythm on monitor Respiratory:  Regular, unlabored respirations on room air GI: Abdomen soft and nontender   NEURO:  Mental Status: Awake and alert neck: Left upper boldo dialdehyde the care of this aneurysm [place] to help you with the LP or oriented to self, age and city.  Place she stated [place].  Speech/Language: speech is with mild dysarthria and  aphasia.   Cranial Nerves:  II: PERRL. Visual fields full.  III, IV, VI: EOMI. Eyelids elevate symmetrically.  V: Sensation is intact to light touch and symmetrical  to face.  VII: Face is symmetrical resting and smiling VIII: hearing intact to voice. IX, X: Palate elevates symmetrically. Phonation is normal.  UJ:[id number] shrug 5/5. XII: tongue is midline without fasciculations. Motor: 5/5 strength in right upper and arm. Left upper and lower 4/5 Tone: Increased in left upper extremity with nonfixed flexion contractures of the left hand fingers Sensation- Intact to light touch bilaterally. Extinction absent to light touch to DSS.   Coordination: FTN intact bilaterally, HKS: no ataxia in BLE.No drift.  Gait- deferred   ASSESSMENT/PLAN  Acute Ischemic Infarct:  right PCA  Etiology:  likely embolic   CT head Infarct in the inferior right cerebellum   MRI  1.3 cm acute to early subacute right PICA distribution infarct  MRA  slightly hypoplastic posterior circulation with mild narrowing of the terminal left vertebral and distal basilar.   Carotid Doppler   bilateral 1-39% stenosis. 2D Echo EF 55 to 60%. Likely will need 30 day heart monitor  LDL 37 HgbA1c 5.8 VTE prophylaxis - SCD aspirin 81 mg daily and Brilinta (ticagrelor) 90 mg bid prior to admission, now on aspirin 81 mg daily and Brilinta (ticagrelor) 90 mg bid  Therapy recommendations: Skilled nursing facility  disposition: Skilled nursing facility  Hx of Stroke/TIA Stroke history  Migraine Hx of right BG in 2022 On keppra 750 mg at bedtime for focal epilepsy. 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  Additionally there is mild diffuse encephalopathy. No seizures were seen throughout the recording.    Hypertension CHF Home meds:  hydralazine 100mg , losartan 50mg ,  Stable Blood Pressure Goal: BP less than 220/110   Hyperlipidemia Home meds:  crestor 20mg ,  resumed in hospital LDL 37, goal < 70 Continue statin at discharge  Diabetes type II Controlled Home meds:  insulin HgbA1c 5.8, goal < 7.0 CBGs SSI Recommend close follow-up with PCP for better DM control  Dysphagia Patient has post-stroke dysphagia, SLP consulted    Diet   DIET DYS 2 Room service appropriate? Yes; Fluid consistency: Thin   Advance diet as tolerated  Other Stroke Risk Factors Obesity, Body mass index is 31.05 kg/m., BMI >/= 30 associated with increased stroke risk, recommend weight loss, diet and exercise as appropriate  Congestive heart failure  Other Active Problems ESRD on HD  COVID/ RSV  Hospital day # 4    Patient presented with active COVID and RSV infection with altered mental status.  MRI scan shows right PICA branch infarct likely of embolic etiology.  Continue aspirin and Brilinta..  At discharge recommend 30-day heart monitor to look for for paroxysmal A-fib.  EEG showed spikes  in the right hemisphere but these appear to be chronic as seen in the previous EEG from August 23.  Continue Keppra at the current dose.  Stroke team will sign off.  Kindly call for questions. Discussed with Dr. Lucianne Muss.  Greater than 50% time during this 35-minute visit were spent in counseling and coordination of care and discussion patient care team and answering questions about the stroke and seizures. Carolyn Heady, MD Medical Director [facility] Pager: [phone] 11/20/2023 2:18 PM   To contact Stroke Continuity provider, please refer to [email or web address]. After hours, contact General Neurology

## 2023-11-20 NOTE — Progress Notes (Signed)
   11/20/23 1900  Vitals  Temp 98.6 F (37 C)  Pulse Rate 88  Resp 16  BP 122/67  SpO2 98 %  O2 Device Nasal Cannula  Weight 76.8 kg  Type of Weight Post-Dialysis  Oxygen Therapy  O2 Flow Rate (L/min) 2 L/min  Patient Activity (if Appropriate) In bed  Pulse Oximetry Type Continuous  Oximetry Probe Site Changed No  Post Treatment  Dialyzer Clearance Lightly streaked  Hemodialysis Intake (mL) 0 mL  Liters Processed 78.4  Fluid Removed (mL) 2500 mL  Tolerated HD Treatment Yes     11/20/23 1900  Vitals  Temp 98.6 F (37 C)  Pulse Rate 88  Resp 16  BP 122/67  SpO2 98 %  O2 Device Nasal Cannula  Weight 76.8 kg  Type of Weight Post-Dialysis  Oxygen Therapy  O2 Flow Rate (L/min) 2 L/min  Patient Activity (if Appropriate) In bed  Pulse Oximetry Type Continuous  Oximetry Probe Site Changed No  Post Treatment  Dialyzer Clearance Lightly streaked  Hemodialysis Intake (mL) 0 mL  Liters Processed 78.4  Fluid Removed (mL) 2500 mL  Tolerated HD Treatment Yes   Received patient in bed to unit.  Alert and oriented.  Informed consent signed and in chart.   TX duration:3.30  Patient tolerated well.  Transported back to the room  Alert, without acute distress.  Hand-off given to patient's nurse.   Access used: [email or web address] Access issues: no complications  Total UF removed: 2500 Medication(s) given: none [facility]

## 2023-11-20 NOTE — Plan of Care (Signed)
  Problem: Ischemic Stroke/TIA Tissue Perfusion: Goal: Complications of ischemic stroke/TIA will be minimized Outcome: Progressing   Problem: Self-Care: Goal: Ability to communicate needs accurately will improve Outcome: Progressing   Problem: Clinical Measurements: Goal: Ability to maintain clinical measurements within normal limits will improve Outcome: Progressing   Problem: Nutrition: Goal: Adequate nutrition will be maintained Outcome: Progressing   Problem: Pain Managment: Goal: General experience of comfort will improve and/or be controlled Outcome: Progressing   Problem: Skin Integrity: Goal: Risk for impaired skin integrity will decrease Outcome: Progressing

## 2023-11-20 NOTE — Progress Notes (Addendum)
Mobility Specialist Progress Note;   11/20/23 0915  Mobility  Activity Transferred from bed to chair  Level of Assistance +2 (takes two people) Audiological scientist)  Assistive Device Stedy  Activity Response Tolerated well  Mobility Referral Yes  Mobility visit 1 Mobility  Mobility Specialist Start Time (ACUTE ONLY) 0915  Mobility Specialist Stop Time (ACUTE ONLY) 0940  Mobility Specialist Time Calculation (min) (ACUTE ONLY) 25 min   Pt agreeable to mobility. Required MinA +2 via stedy to transfer pt from bed to chair. Bed soiled upon standing requiring linen change and pt to be cleaned up. Upon standing as well, pt became incontinent thinking she had a pure wick in, requiring cleaning as well. No other c/o and VSS throughout. Pt left comfortably in chair with all needs met, alarm on. RN and NT notified.   Caesar Bookman Mobility Specialist Please contact via SecureChat or [employer] [phone]

## 2023-11-20 NOTE — Plan of Care (Signed)
  Problem: Clinical Measurements: Goal: Ability to maintain clinical measurements within normal limits will improve Outcome: Progressing   Problem: Nutrition: Goal: Adequate nutrition will be maintained Outcome: Progressing   Problem: Pain Managment: Goal: General experience of comfort will improve and/or be controlled Outcome: Progressing   Problem: Skin Integrity: Goal: Risk for impaired skin integrity will decrease Outcome: Progressing

## 2023-11-20 NOTE — Progress Notes (Addendum)
Speech Language Pathology Treatment: Dysphagia;Cognitive-Linquistic  Patient Details Name: Carolyn Russo MRN: [id number] DOB: [birth year] Today's Date: 11/20/2023 Time: 4098-1191 SLP Time Calculation (min) (ACUTE ONLY): 18 min  Assessment / Plan / Recommendation Clinical Impression  Pt much more participatory and had good recall of diet recommendations, compensatory strategies, and positioning. She participated in conversation with seemingly improved attention and recall. Pt states she feels she is at her baseline. Suspect pt's performance may fluctuate given acute illness, although feel that overall she presents similarly to her baseline. Recommend ongoing f/u with SLP at pt's next venue of care for goals related to cognition.   Pt presents with decreased alertness this date and now demonstrates intermittent signs of aspiration following trials of thin liquids. NT reports concerns with pt's eating and drinking. Observed with regular texture solids, with which she achieved complete oral clearance and had no significant anterior spillage. She has audible secretions and a wet sounding cough but is unable to mobilize secretions sufficiently to expectorate them orally. Following sips of thin liquids via straw, pt had a cough that ranged from significantly delayed to immediately following the swallow. Given inconsistent presentation, recommend completing an MBS to further assess function. Pt may continue current diet with full supervision pending completion of MBS, which SLP f/u for as scheduling allows.   HPI HPI: Patient is a 73 y.o. female with PMH: CVA in 2022 and 2023, right BKA, HTN, DM-2, HLD, ESRD on HD MWF, asthma, dysphagia, CHF, heart murmur, seizure. She presented to the hospital from SNF on 11/16/23 for chest pain, weakness and confusion. CT head revealed infarct in inferior right cerebellum which was new since 06/12/22. MRI brain with acute right inferomedial cerebellar ischemic infarction in the  right PICA territory. Pt also diagnosed with Covid/RSV.      SLP Plan  Continue with current plan of care      Recommendations for follow up therapy are one component of a multi-disciplinary discharge planning process, led by the attending physician.  Recommendations may be updated based on patient status, additional functional criteria and insurance authorization.    Recommendations  Diet recommendations: Dysphagia 2 (fine chop);Thin liquid Liquids provided via: Cup;Straw Medication Administration: Whole meds with puree Supervision: Staff to assist with self feeding;Full supervision/cueing for compensatory strategies Compensations: Slow rate;Small sips/bites Postural Changes and/or Swallow Maneuvers: Seated upright 90 degrees;Upright 30-60 min after meal                  Oral care BID   Frequent or constant Supervision/Assistance Cognitive communication deficit (R41.841);Dysphagia, unspecified (R13.10)     Continue with current plan of care     Gwynneth Aliment, M.A., CF-SLP Speech Language Pathology, Acute Rehabilitation Services  Secure Chat preferred [phone]   11/20/2023, 12:57 PM

## 2023-11-21 ENCOUNTER — Inpatient Hospital Stay (HOSPITAL_COMMUNITY): Payer: Medicare PPO

## 2023-11-21 DIAGNOSIS — G9340 Encephalopathy, unspecified: Secondary | ICD-10-CM | POA: Diagnosis not present

## 2023-11-21 LAB — GLUCOSE, CAPILLARY
Glucose-Capillary: 97 mg/dL (ref 70–99)
Glucose-Capillary: 115 mg/dL — ABNORMAL HIGH (ref 70–99)
Glucose-Capillary: 229 mg/dL — ABNORMAL HIGH (ref 70–99)
Glucose-Capillary: 183 mg/dL — ABNORMAL HIGH (ref 70–99)

## 2023-11-21 LAB — CBC
HCT: 34.4 % — ABNORMAL LOW (ref 36.0–46.0)
Hemoglobin: 11.7 g/dL — ABNORMAL LOW (ref 12.0–15.0)
MCH: 29.3 pg (ref 26.0–34.0)
MCHC: 34 g/dL (ref 30.0–36.0)
MCV: 86 fL (ref 80.0–100.0)
Platelets: 364 10*3/uL (ref 150–400)
RBC: 4 MIL/uL (ref 3.87–5.11)
RDW: 18.6 % — ABNORMAL HIGH (ref 11.5–15.5)
WBC: 8.7 10*3/uL (ref 4.0–10.5)
nRBC: 0.9 % — ABNORMAL HIGH (ref 0.0–0.2)

## 2023-11-21 LAB — BASIC METABOLIC PANEL
Anion gap: 12 (ref 5–15)
BUN: 19 mg/dL (ref 8–23)
CO2: 26 mmol/L (ref 22–32)
Calcium: 8.5 mg/dL — ABNORMAL LOW (ref 8.9–10.3)
Chloride: 96 mmol/L — ABNORMAL LOW (ref 98–111)
Creatinine, Ser: 4.18 mg/dL — ABNORMAL HIGH (ref 0.44–1.00)
GFR, Estimated: 11 mL/min — ABNORMAL LOW (ref >60)
Glucose, Bld: 93 mg/dL (ref 70–99)
Potassium: 3.6 mmol/L (ref 3.5–5.1)
Sodium: 134 mmol/L — ABNORMAL LOW (ref 135–145)

## 2023-11-21 LAB — CULTURE, BLOOD (ROUTINE X 2)
Culture: NO GROWTH
Culture: NO GROWTH

## 2023-11-21 MED ORDER — LEVETIRACETAM 500 MG PO TABS
500.0000 mg | ORAL_TABLET | ORAL | Status: DC
  Administered 2023-11-22: 500 mg via ORAL
  Filled 2023-11-21: qty 1

## 2023-11-21 MED ORDER — ATORVASTATIN CALCIUM 40 MG PO TABS
40.0000 mg | ORAL_TABLET | Freq: Every day | ORAL | Status: DC
  Administered 2023-11-21 – 2023-11-22 (×2): 40 mg via ORAL
  Filled 2023-11-21 (×2): qty 1

## 2023-11-21 NOTE — Progress Notes (Signed)
Pt receives out-pt HD at [facility] [place] on MWF. Will assist as needed.   Olivia Canter Renal Navigator [phone]

## 2023-11-21 NOTE — Progress Notes (Signed)
Nephrology Follow-Up Consult note   Assessment/Recommendations: Carolyn Russo is a/an 73 y.o. female with a past medical history significant for ESRD, admitted for AMS w/ AHRF, CAP, COVID, RSV.       Dialysis Orders: MWF [facility]  4h   B350    75.5kg   2/2 bath   R fem TDC  Heparin 2600 + 1400 midrun - last HD 1/22, getting to dry wt - 3-4 drops in dry wt in last 3 wks - venofer 100 qhd thru 1/31 - mircera 150 q2, last 1/17, due 1/31   Assessment/Plan: AHRF/ CAP -  +COVID and RSV. Some volume excess. Abx per primary. UF w/ HF AMS/CVA - CVA found mgmt per neurology. AMS multifactorial ESRD - on HD MWF, maintain schedule HTN - pt BP's were low on arrival, now elevated, resume home antihypertensives as needed Volume - UF as tolerated with HD Anemia of esrd - Hfb at goal. ESA due on 1/31. Secondary hyperparathyroidism - Cont home binders H/o seizure d/o - on Keppra   Recommendations conveyed to primary service.    Darnell Level [facility] 11/21/2023 9:32 AM  ___________________________________________________________  CC: AMS  Interval History/Subjective: Tolerated HD with no acute issues.   Medications:  Current Facility-Administered Medications  Medication Dose Route Frequency Provider Last Rate Last Admin    stroke: early stages of recovery book   Does not apply Once Nolberto Hanlon, MD       acetaminophen (TYLENOL) tablet 650 mg  650 mg Oral Q6H PRN Nolberto Hanlon, MD   650 mg at 11/20/23 1110   Or   acetaminophen (TYLENOL) suppository 650 mg  650 mg Rectal Q6H PRN Nolberto Hanlon, MD       aspirin EC tablet 81 mg  81 mg Oral Daily Little Ishikawa, MD   81 mg at 11/20/23 0851   bisacodyl (DULCOLAX) EC tablet 10 mg  10 mg Oral QHS Gillis Santa, MD   10 mg at 11/20/23 2229   bisacodyl (DULCOLAX) suppository 10 mg  10 mg Rectal Daily PRN Gillis Santa, MD       cefTRIAXone (ROCEPHIN) 2 g in sodium chloride 0.9 % 100 mL IVPB  2 g Intravenous Q24H Nolberto Hanlon,  MD 200 mL/hr at 11/20/23 2225 2 g at 11/20/23 2225   Chlorhexidine Gluconate Cloth 2 % PADS 6 each  6 each Topical Q0600 Gillis Santa, MD   6 each at 11/20/23 0851   Chlorhexidine Gluconate Cloth 2 % PADS 6 each  6 each Topical Q0600 Oretha Milch, PA-C   6 each at 11/21/23 0501   chlorpheniramine-HYDROcodone (TUSSIONEX) 10-8 MG/5ML suspension 5 mL  5 mL Oral Q12H PRN Gillis Santa, MD   5 mL at 11/20/23 1352   fluticasone furoate-vilanterol (BREO ELLIPTA) 200-25 MCG/ACT 1 puff  1 puff Inhalation Daily Nolberto Hanlon, MD   1 puff at 11/21/23 0845   guaiFENesin (MUCINEX) 12 hr tablet 600 mg  600 mg Oral BID Gillis Santa, MD   600 mg at 11/20/23 2230   heparin injection 5,000 Units  5,000 Units Subcutaneous Q8H Gillis Santa, MD   5,000 Units at 11/21/23 0500   hydrALAZINE (APRESOLINE) injection 10 mg  10 mg Intravenous Q4H PRN Nolberto Hanlon, MD   10 mg at 11/19/23 1100   hydrALAZINE (APRESOLINE) tablet 100 mg  100 mg Oral Q8H Berton Mount I, MD   100 mg at 11/21/23 0500   insulin aspart (novoLOG) injection 0-5 Units  0-5 Units Subcutaneous QHS Goel,  Charmayne Sheer, MD   2 Units at 11/18/23 2103   insulin aspart (novoLOG) injection 0-6 Units  0-6 Units Subcutaneous TID [facility] Nolberto Hanlon, MD   2 Units at 11/19/23 1730   labetalol (NORMODYNE) injection 20 mg  20 mg Intravenous Q2H PRN Nolberto Hanlon, MD   20 mg at 11/19/23 8295   levETIRAcetam (KEPPRA) 380 mg in sodium chloride 0.9 % 100 mL IVPB  380 mg Intravenous Q M,W,F Howerter, Justin B, DO 415.2 mL/hr at 11/20/23 2005 380 mg at 11/20/23 2005   levETIRAcetam (KEPPRA) 750 mg in sodium chloride 0.9 % 100 mL IVPB  750 mg Intravenous QHS Gevena Mart A, NP       lidocaine (LIDODERM) 5 % 1 patch  1 patch Transdermal Q24H Berton Mount I, MD   1 patch at 11/20/23 2009   LORazepam (ATIVAN) tablet 0.5 mg  0.5 mg Oral Once PRN Howerter, Justin B, DO       methylPREDNISolone sodium succinate (SOLU-MEDROL) 40 mg/mL injection 40 mg  40 mg Intravenous Q24H Gillis Santa, MD   40 mg at 11/20/23 0850   nitroGLYCERIN (NITROSTAT) SL tablet 0.4 mg  0.4 mg Sublingual Q30 min PRN Howerter, Justin B, DO   0.4 mg at 11/17/23 0414   pantoprazole (PROTONIX) injection 40 mg  40 mg Intravenous Q24H Nolberto Hanlon, MD   40 mg at 11/20/23 2231   phenylephrine ((USE for PREPARATION-H)) 0.25 % suppository 1 suppository  1 suppository Rectal BID PRN Gillis Santa, MD       polyethylene glycol (MIRALAX / GLYCOLAX) packet 17 g  17 g Oral BID Gillis Santa, MD   17 g at 11/19/23 2117   rosuvastatin (CRESTOR) tablet 20 mg  20 mg Oral QHS Gillis Santa, MD   20 mg at 11/20/23 2230   sevelamer carbonate (RENVELA) tablet 800 mg  800 mg Oral TID WC Delano Metz, MD   800 mg at 11/20/23 1314   sodium chloride flush (NS) 0.9 % injection 3 mL  3 mL Intravenous Q12H Nolberto Hanlon, MD   3 mL at 11/20/23 0850   sorbitol, magnesium hydroxide, mineral oil, glycerin (SMOG) enema  960 mL Rectal Daily PRN Gillis Santa, MD       ticagrelor [facility]) tablet 90 mg  90 mg Oral BID Gillis Santa, MD   90 mg at 11/20/23 2228   witch hazel-glycerin (TUCKS) pad   Topical PRN Gillis Santa, MD          Review of Systems: Deferred today  Physical Exam: Vitals:   11/21/23 0800 11/21/23 0849  BP: (!) 157/77   Pulse:  78  Resp:    Temp: 97.6 F (36.4 C)   SpO2:     No intake/output data recorded.  Intake/Output Summary (Last 24 hours) at 11/21/2023 0932 Last data filed at 11/20/2023 1900 Gross per 24 hour  Intake --  Output 2500 ml  Net -2500 ml   Due to the COVID pandemic, in attempts to limit transmission and over-utilization of resources (e.g. PPE), the patient was seen virtually today by means of chart review, discussion with staff, and virtual discussion with patient as needed.    Test Results I personally reviewed new and old clinical labs and radiology tests Lab Results  Component Value Date   NA 134 (L) 11/21/2023   K 3.6 11/21/2023   CL 96 (L) 11/21/2023   CO2 26  11/21/2023   BUN 19 11/21/2023   CREATININE 4.18 (H) 11/21/2023   GLU 366 06/19/2020  CALCIUM 8.5 (L) 11/21/2023   ALBUMIN 2.9 (L) 11/20/2023   PHOS 3.7 11/20/2023    CBC Recent Labs  Lab 11/19/23 0822 11/20/23 0733 11/21/23 0746  WBC 8.3 9.6 8.7  NEUTROABS  --  6.6  --   HGB 10.1* 10.7* 11.7*  HCT 30.3* 32.0* 34.4*  MCV 87.1 86.7 86.0  PLT 321 338 364

## 2023-11-21 NOTE — Plan of Care (Signed)
  Problem: Metabolic: Goal: Ability to maintain appropriate glucose levels will improve Outcome: Progressing   Problem: Skin Integrity: Goal: Risk for impaired skin integrity will decrease Outcome: Progressing   Problem: Coping: Goal: Psychosocial and spiritual needs will be supported Outcome: Progressing

## 2023-11-21 NOTE — Progress Notes (Signed)
PROGRESS NOTE    Carolyn Russo  BMW:[id number] DOB: Nov 25, 1950 DOA: 11/16/2023 PCP: Annita Brod, MD    Brief Narrative: This 73 year old African-American female, past medical history significant for ESRD on HD (MWF), CHF, CVA with residual left-sided weakness, type 2 diabetes, right BKA, and seizure disorder.  Patient presented at [facility] ED on 11/16/23, with confusion and worsening left chest pain radiating to the arm and left-sided shoulder blade pain (back pain).  Patient has had similar presentation (chest and back pain) in the past, with negative workup.  MRI of the brain revealed 1.3 cm acute to early subacute right PICA distribution infarct.  Patient is on aspirin and Brilinta.  Neurology team is directing care.  Workup is still in progress.  Echo revealed EF of 55-60% with moderate left ventricular hypertrophy and mild aortic valve calcification.  Patient troponin was minimally elevated to 200s.  Patient also had a CT chest angiogram done with no evidence of pulmonary embolism.  Intermittent confusion has improved.   Neurology workup is still in progress.  Likely discharge once neurowork-up is completed.    Assessment & Plan:   Principal Problem:   Acute encephalopathy Active Problems:   CAP (community acquired pneumonia)   ESRD on dialysis (HCC)   Troponin I above reference range   Pneumonia  Acute Metabolic encephalopathy, likely multifactorial: This could be secondary to COVID and RSV infection along with acute CVA and seizure disorder . Encephalopathy has improved significantly.   Continue supportive care. Continue delirium precautions. Likely discharge once neurology workup is completed (CVA workup).   Acute CVA: Patient has prior history of CVA with residual left sided weakness. MRI positive for acute to subacute CVA. Continue aspirin and Brilinta   Continue statin LDL 37, way below goal  <70, TSH 1.5 WNL Neurology input is highly appreciated.  Patient needs 30-day heart  monitor at discharge. Neurology signed off.   Seizure disorder : EEG findings noted.  No seizures were seen throughout the recording. Continue Keppra 750 mg twice daily and additional dose after hemodialysis Continue seizure precautions.   Pneumonia due to COVID and RSV along with possible bacterial superinfection: Complete course of antibiotics (ceftriaxone and azithromycin) Continue tapering steroid. PPI for GI prophylaxis Continue Breo Ellipta inhaler and DuoNeb every 6 hourly Mucinex 600 mg p.o. twice daily Procalcitonin 0.86 WBC count within normal range. Continue airborne isolation.   ESRD on hemodialysis, MWF schedule: Continue hemodialysis Monday, Wednesday and Friday. Nephrology following.  Home blood pressure medications resumed.   Diabetes mellitus type 2 Continue NovoLog sliding scale   DVT prophylaxis: Heparin sq Code Status: Full code Family Communication:No family at bed side Disposition Plan:   Status is: Inpatient Remains inpatient appropriate because: Severity of illness.    Consultants:  Neurology Nephrology  Procedures:  Antimicrobials: Anti-infectives (From admission, onward)    Start     Dose/Rate Route Frequency Ordered Stop   11/16/23 2300  azithromycin (ZITHROMAX) 500 mg in sodium chloride 0.9 % 250 mL IVPB        500 mg 250 mL/hr over 60 Minutes Intravenous Every 24 hours 11/16/23 2112 11/18/23 2300   11/16/23 2200  cefTRIAXone (ROCEPHIN) 2 g in sodium chloride 0.9 % 100 mL IVPB        2 g 200 mL/hr over 30 Minutes Intravenous Every 24 hours 11/16/23 2112          Subjective: Patient was seen and examined at bedside.  Overnight events noted. Patient reports feeling much improved.  She  was lying comfortably on the bed. She denies any concerns. She is awaiting SNF placement.   Objective: Vitals:   11/21/23 0450 11/21/23 0800 11/21/23 0849 11/21/23 1100  BP: 139/62 (!) 157/77  (!) 182/88  Pulse: 87  78 80  Resp: 14   17  Temp:  97.6 F (36.4 C) 97.6 F (36.4 C)  97.8 F (36.6 C)  TempSrc: Oral Oral  Oral  SpO2: 95%   100%  Weight:      Height:        Intake/Output Summary (Last 24 hours) at 11/21/2023 1335 Last data filed at 11/20/2023 1900 Gross per 24 hour  Intake --  Output 2500 ml  Net -2500 ml   Filed Weights   11/20/23 1500 11/20/23 1900  Weight: 79.5 kg 76.8 kg    Examination:  General exam: Appears comfortable, deconditioned, not in any acute distress. Respiratory system: CTA bilaterally. Respiratory effort normal.  RR 16 Cardiovascular system: S1 & S2 heard, RRR. No JVD, murmurs, rubs, gallops or clicks. Gastrointestinal system: Abdomen is non distended, soft and non tender.  Normal bowel sounds heard. Central nervous system: Alert and oriented X 3.  Left-sided weakness from prior stroke. Extremities: Right BKA , No edema, no cyanosis, no clubbing Skin: No rashes, lesions or ulcers Psychiatry: Judgement and insight appear normal. Mood & affect appropriate.     Data Reviewed: I have personally reviewed following labs and imaging studies  CBC: Recent Labs  Lab 11/17/23 0349 11/18/23 0205 11/19/23 0822 11/20/23 0733 11/21/23 0746  WBC 8.0 9.0 8.3 9.6 8.7  NEUTROABS  --   --   --  6.6  --   HGB 10.3* 10.5* 10.1* 10.7* 11.7*  HCT 30.3* 30.9* 30.3* 32.0* 34.4*  MCV 87.6 87.3 87.1 86.7 86.0  PLT 301 294 321 338 364   Basic Metabolic Panel: Recent Labs  Lab 11/17/23 0349 11/17/23 1200 11/18/23 0205 11/19/23 0822 11/20/23 0733 11/21/23 0746  NA 139  --  136 138 135 134*  K 4.1  --  3.7 3.7 3.7 3.6  CL 98  --  98 99 97* 96*  CO2 26  --  26 25 24 26   GLUCOSE 117*  --  98 129* 93 93  BUN 22  --  11 29* 39* 19  CREATININE 5.68*  --  3.15* 5.45* 6.40* 4.18*  CALCIUM 7.9*  --  8.3* 8.1* 8.0* 8.5*  MG  --   --  1.9 2.0  --   --   PHOS  --  4.7*  --   --  3.7  --    GFR: Estimated Creatinine Clearance: 11.4 mL/min (A) (by C-G formula based on SCr of 4.18 mg/dL (H)). Liver  Function Tests: Recent Labs  Lab 11/16/23 2200 11/20/23 0733  AST 18  --   ALT 12  --   ALKPHOS 110  --   BILITOT 0.7  --   PROT 7.3  --   ALBUMIN 2.9* 2.9*   No results for input(s): "LIPASE", "AMYLASE" in the last 168 hours. Recent Labs  Lab 11/16/23 2318  AMMONIA 17   Coagulation Profile: Recent Labs  Lab 11/17/23 0349  INR 1.1   Cardiac Enzymes: No results for input(s): "CKTOTAL", "CKMB", "CKMBINDEX", "TROPONINI" in the last 168 hours. BNP (last 3 results) No results for input(s): "PROBNP" in the last 8760 hours. HbA1C: No results for input(s): "HGBA1C" in the last 72 hours. CBG: Recent Labs  Lab 11/20/23 1050 11/20/23 1630 11/20/23 2112 11/21/23 4098  11/21/23 1145  GLUCAP 124* 194* 160* 97 115*   Lipid Profile: No results for input(s): "CHOL", "HDL", "LDLCALC", "TRIG", "CHOLHDL", "LDLDIRECT" in the last 72 hours. Thyroid Function Tests: No results for input(s): "TSH", "T4TOTAL", "FREET4", "T3FREE", "THYROIDAB" in the last 72 hours. Anemia Panel: No results for input(s): "VITAMINB12", "FOLATE", "FERRITIN", "TIBC", "IRON", "RETICCTPCT" in the last 72 hours. Sepsis Labs: Recent Labs  Lab 11/17/23 0013 11/17/23 0930  PROCALCITON  --  0.86  LATICACIDVEN 0.9  --     Recent Results (from the past 240 hours)  Respiratory (~20 pathogens) panel by PCR     Status: Abnormal   Collection Time: 11/16/23  8:57 PM   Specimen: Nasopharyngeal Swab; Respiratory  Result Value Ref Range Status   Adenovirus NOT DETECTED NOT DETECTED Final   Coronavirus 229E NOT DETECTED NOT DETECTED Final    Comment: (NOTE) The Coronavirus on the Respiratory Panel, DOES NOT test for the novel  Coronavirus (2019 nCoV)    Coronavirus HKU1 NOT DETECTED NOT DETECTED Final   Coronavirus NL63 NOT DETECTED NOT DETECTED Final   Coronavirus OC43 NOT DETECTED NOT DETECTED Final   Metapneumovirus NOT DETECTED NOT DETECTED Final   Rhinovirus / Enterovirus NOT DETECTED NOT DETECTED Final    Influenza A NOT DETECTED NOT DETECTED Final   Influenza B NOT DETECTED NOT DETECTED Final   Parainfluenza Virus 1 NOT DETECTED NOT DETECTED Final   Parainfluenza Virus 2 NOT DETECTED NOT DETECTED Final   Parainfluenza Virus 3 NOT DETECTED NOT DETECTED Final   Parainfluenza Virus 4 NOT DETECTED NOT DETECTED Final   Respiratory Syncytial Virus DETECTED (A) NOT DETECTED Final   Bordetella pertussis NOT DETECTED NOT DETECTED Final   Bordetella Parapertussis NOT DETECTED NOT DETECTED Final   Chlamydophila pneumoniae NOT DETECTED NOT DETECTED Final   Mycoplasma pneumoniae NOT DETECTED NOT DETECTED Final    Comment: Performed at [facility], [address] [place], [place] [address]  SARS Coronavirus 2 by RT PCR (hospital order, performed in [facility] hospital lab) *cepheid single result test* Anterior Nasal Swab     Status: Abnormal   Collection Time: 11/16/23  8:57 PM   Specimen: Anterior Nasal Swab  Result Value Ref Range Status   SARS Coronavirus 2 by RT PCR POSITIVE (A) NEGATIVE Final    Comment: Performed at [facility], [address], [place], [place] [address]  Culture, blood (Routine X 2) w Reflex to ID Panel     Status: None   Collection Time: 11/16/23 10:45 PM   Specimen: BLOOD  Result Value Ref Range Status   Specimen Description BLOOD SITE NOT SPECIFIED  Final   Special Requests   Final    BOTTLES DRAWN AEROBIC AND ANAEROBIC Blood Culture results may not be optimal due to an inadequate volume of blood received in culture bottles   Culture   Final    NO GROWTH 5 DAYS Performed at [facility], [address] [place], [place] [address]    Report Status 11/21/2023 FINAL  Final  Culture, blood (Routine X 2) w Reflex to ID Panel     Status: None   Collection Time: 11/16/23 10:48 PM   Specimen: BLOOD  Result Value Ref Range Status   Specimen Description BLOOD SITE NOT SPECIFIED  Final   Special Requests   Final    BOTTLES DRAWN AEROBIC AND ANAEROBIC Blood  Culture results may not be optimal due to an inadequate volume of blood received in culture bottles  Culture   Final    NO GROWTH 5 DAYS Performed at [facility], [address] [place], [place] [address]    Report Status 11/21/2023 FINAL  Final  MRSA Next Gen by PCR, Nasal     Status: None   Collection Time: 11/17/23  2:23 PM   Specimen: Nasal Mucosa; Nasal Swab  Result Value Ref Range Status   MRSA by PCR Next Gen NOT DETECTED NOT DETECTED Final    Comment: (NOTE) The GeneXpert MRSA Assay (FDA approved for NASAL specimens only), is one component of a comprehensive MRSA colonization surveillance program. It is not intended to diagnose MRSA infection nor to guide or monitor treatment for MRSA infections. Test performance is not FDA approved in patients less than 80 years old. Performed at [facility], [address] [place], [place] [address]    Radiology Studies: No results found.  Scheduled Meds:   stroke: early stages of recovery book   Does not apply Once   aspirin EC  81 mg Oral Daily   atorvastatin  40 mg Oral QHS   bisacodyl  10 mg Oral QHS   Chlorhexidine Gluconate Cloth  6 each Topical Q0600   Chlorhexidine Gluconate Cloth  6 each Topical Q0600   fluticasone furoate-vilanterol  1 puff Inhalation Daily   guaiFENesin  600 mg Oral BID   heparin injection (subcutaneous)  5,000 Units Subcutaneous Q8H   hydrALAZINE  100 mg Oral Q8H   insulin aspart  0-5 Units Subcutaneous QHS   insulin aspart  0-6 Units Subcutaneous TID WC   [START ON 11/22/2023] levETIRAcetam  500 mg Oral Once per day on Monday Wednesday Friday   lidocaine  1 patch Transdermal Q24H   methylPREDNISolone (SOLU-MEDROL) injection  40 mg Intravenous Q24H   pantoprazole (PROTONIX) IV  40 mg Intravenous Q24H   polyethylene glycol  17 g Oral BID   sevelamer carbonate  800 mg Oral TID WC   sodium chloride flush  3 mL Intravenous Q12H   ticagrelor  90 mg Oral BID   Continuous Infusions:   cefTRIAXone (ROCEPHIN)  IV 2 g (11/20/23 2225)     LOS: 5 days    Time spent: 35 mins    Willeen Niece, MD [facility]   If 7PM-7AM, please contact night-coverage

## 2023-11-21 NOTE — Progress Notes (Signed)
Modified Barium Swallow Study  Patient Details  Name: Carolyn Russo MRN: [id number] Date of Birth: 1951-01-09  Today's Date: 11/21/2023  Modified Barium Swallow completed.  Full report located under Chart Review in the Imaging Section.  History of Present Illness Patient is a 73 y.o. female with PMH: CVA in 2022 and 2023, right BKA, HTN, DM-2, HLD, ESRD on HD MWF, asthma, dysphagia, CHF, heart murmur, seizure. She presented to the hospital from [facility] on 11/[facility]/23 for chest pain, weakness and confusion. CT head revealed infarct in inferior right cerebellum which was new since 06/12/22. MRI brain with acute right inferomedial cerebellar ischemic infarction in the right PICA territory. Pt also diagnosed with Covid/RSV.   Clinical Impression Pt exhibits mild oropharyngeal dysphagia characterized by lingual weakness and impaired sensation. She initiates a swallow response which clears the majority of each bolus from her oral cavity, but does not clear it completely. Pt does not achieve complete BOT retraction and the oral residue progresses through the pharynx to collect in the pyriform sinuses without sensation. All liquid consistencies enters the open laryngeal vestibule, where they are eventually cleared with a cued subsequent swallow or cough (PAS 3). Residue increases with thicker residue, increasing the risk of airway invasion. Of note, pt coughed multiple times throughout the study in the absence of penetration/aspiration. Cueing the pt to immediately initiate a second swallow helped clear the oropharyngeal residue promptly. Biofeedback was used to demonstrate effectiveness of subswallows on function with pt demonstrating understanding. Recommend continuing current diet of Dys 2 solids and thin liquids. SLP will f/u to reinforce use of compensatory strategies.  DIGEST Swallow Severity Rating*  Safety: 1  Efficiency: 1  Overall Pharyngeal Swallow Severity: mild 1: mild; 2: moderate; 3: severe; 4:  profound  *The Dynamic Imaging Grade of Swallowing Toxicity is standardized for the head and neck cancer population, however, demonstrates promising clinical applications across populations to standardize the clinical rating of pharyngeal swallow safety and severity.  Factors that may increase risk of adverse event in presence of aspiration Rubye Oaks & Clearance Coots 2021): Reduced cognitive function  Swallow Evaluation Recommendations Recommendations: PO diet PO Diet Recommendation: Dysphagia 2 (Finely chopped);Thin liquids (Level 0) Liquid Administration via: Cup;Straw Medication Administration: Whole meds with puree Supervision: Staff to assist with self-feeding;Full supervision/cueing for swallowing strategies Swallowing strategies  : Minimize environmental distractions;Slow rate;Small bites/sips;Multiple dry swallows after each bite/sip Postural changes: Position pt fully upright for meals;Stay upright 30-60 min after meals Oral care recommendations: Oral care BID (2x/day)    Gwynneth Aliment, M.A., CF-SLP Speech Language Pathology, Acute Rehabilitation Services  Secure Chat preferred [phone]  11/21/2023,4:21 PM

## 2023-11-21 NOTE — Plan of Care (Signed)
  Problem: Education: Goal: Ability to describe self-care measures that may prevent or decrease complications (Diabetes Survival Skills Education) will improve 11/21/2023 0650 by Emeterio Reeve, RN Outcome: Progressing 11/20/2023 2047 by Emeterio Reeve, RN Outcome: Progressing Goal: Individualized Educational Video(s) 11/21/2023 0650 by Emeterio Reeve, RN Outcome: Progressing 11/20/2023 2047 by Emeterio Reeve, RN Outcome: Progressing   Problem: Coping: Goal: Ability to adjust to condition or change in health will improve 11/21/2023 0650 by Emeterio Reeve, RN Outcome: Progressing 11/20/2023 2047 by Emeterio Reeve, RN Outcome: Progressing

## 2023-11-22 DIAGNOSIS — G9340 Encephalopathy, unspecified: Secondary | ICD-10-CM | POA: Diagnosis not present

## 2023-11-22 LAB — CBC
HCT: 37.6 % (ref 36.0–46.0)
Hemoglobin: 13.1 g/dL (ref 12.0–15.0)
MCH: 29.9 pg (ref 26.0–34.0)
MCHC: 34.8 g/dL (ref 30.0–36.0)
MCV: 85.8 fL (ref 80.0–100.0)
Platelets: 367 10*3/uL (ref 150–400)
RBC: 4.38 MIL/uL (ref 3.87–5.11)
RDW: 18.8 % — ABNORMAL HIGH (ref 11.5–15.5)
WBC: 7.6 10*3/uL (ref 4.0–10.5)
nRBC: 0.8 % — ABNORMAL HIGH (ref 0.0–0.2)

## 2023-11-22 LAB — BASIC METABOLIC PANEL
Anion gap: 17 — ABNORMAL HIGH (ref 5–15)
BUN: 31 mg/dL — ABNORMAL HIGH (ref 8–23)
CO2: 24 mmol/L (ref 22–32)
Calcium: 8.9 mg/dL (ref 8.9–10.3)
Chloride: 94 mmol/L — ABNORMAL LOW (ref 98–111)
Creatinine, Ser: 5.83 mg/dL — ABNORMAL HIGH (ref 0.44–1.00)
GFR, Estimated: 7 mL/min — ABNORMAL LOW (ref >60)
Glucose, Bld: 83 mg/dL (ref 70–99)
Potassium: 4.2 mmol/L (ref 3.5–5.1)
Sodium: 135 mmol/L (ref 135–145)

## 2023-11-22 LAB — GLUCOSE, CAPILLARY
Glucose-Capillary: 92 mg/dL (ref 70–99)
Glucose-Capillary: 139 mg/dL — ABNORMAL HIGH (ref 70–99)
Glucose-Capillary: 183 mg/dL — ABNORMAL HIGH (ref 70–99)
Glucose-Capillary: 182 mg/dL — ABNORMAL HIGH (ref 70–99)

## 2023-11-22 MED ORDER — LEVETIRACETAM 750 MG PO TABS
750.0000 mg | ORAL_TABLET | Freq: Every day | ORAL | Status: DC
  Administered 2023-11-22: 750 mg via ORAL
  Filled 2023-11-22: qty 1

## 2023-11-22 MED ORDER — PANTOPRAZOLE SODIUM 40 MG PO TBEC
40.0000 mg | DELAYED_RELEASE_TABLET | Freq: Every day | ORAL | Status: DC
  Administered 2023-11-22: 40 mg via ORAL
  Filled 2023-11-22: qty 1

## 2023-11-22 NOTE — Plan of Care (Signed)
  Problem: Nutritional: Goal: Maintenance of adequate nutrition will improve Outcome: Progressing Goal: Progress toward achieving an optimal weight will improve Outcome: Progressing   Problem: Skin Integrity: Goal: Risk for impaired skin integrity will decrease Outcome: Progressing   Problem: Coping: Goal: Will verbalize positive feelings about self Outcome: Progressing Goal: Will identify appropriate support needs Outcome: Progressing   Problem: Respiratory: Goal: Will maintain a patent airway Outcome: Progressing   Problem: Coping: Goal: Level of anxiety will decrease Outcome: Progressing   Problem: Elimination: Goal: Will not experience complications related to bowel motility Outcome: Progressing Goal: Will not experience complications related to urinary retention Outcome: Progressing   Problem: Pain Managment: Goal: General experience of comfort will improve and/or be controlled Outcome: Progressing   Problem: Safety: Goal: Ability to remain free from injury will improve Outcome: Progressing   Problem: Skin Integrity: Goal: Risk for impaired skin integrity will decrease Outcome: Progressing

## 2023-11-22 NOTE — Plan of Care (Signed)
  Problem: Coping: Goal: Ability to adjust to condition or change in health will improve Outcome: Progressing   Problem: Fluid Volume: Goal: Ability to maintain a balanced intake and output will improve Outcome: Progressing   Problem: Health Behavior/Discharge Planning: Goal: Ability to identify and utilize available resources and services will improve Outcome: Progressing Goal: Ability to manage health-related needs will improve Outcome: Progressing   Problem: Metabolic: Goal: Ability to maintain appropriate glucose levels will improve Outcome: Progressing   Problem: Nutritional: Goal: Maintenance of adequate nutrition will improve Outcome: Progressing Goal: Progress toward achieving an optimal weight will improve Outcome: Progressing   Problem: Skin Integrity: Goal: Risk for impaired skin integrity will decrease Outcome: Progressing   Problem: Education: Goal: Knowledge of disease or condition will improve Outcome: Progressing Goal: Knowledge of secondary prevention will improve (MUST DOCUMENT ALL) Outcome: Progressing Goal: Knowledge of patient specific risk factors will improve (DELETE if not current risk factor) Outcome: Progressing   Problem: Ischemic Stroke/TIA Tissue Perfusion: Goal: Complications of ischemic stroke/TIA will be minimized Outcome: Progressing   Problem: Coping: Goal: Will verbalize positive feelings about self Outcome: Progressing Goal: Will identify appropriate support needs Outcome: Progressing   Problem: Health Behavior/Discharge Planning: Goal: Ability to manage health-related needs will improve Outcome: Progressing Goal: Goals will be collaboratively established with patient/family Outcome: Progressing   Problem: Self-Care: Goal: Ability to participate in self-care as condition permits will improve Outcome: Progressing Goal: Verbalization of feelings and concerns over difficulty with self-care will improve Outcome:  Progressing Goal: Ability to communicate needs accurately will improve Outcome: Progressing   Problem: Nutrition: Goal: Dietary intake will improve Outcome: Progressing   Problem: Education: Goal: Knowledge of risk factors and measures for prevention of condition will improve Outcome: Progressing   Problem: Coping: Goal: Psychosocial and spiritual needs will be supported Outcome: Progressing   Problem: Respiratory: Goal: Will maintain a patent airway Outcome: Progressing Goal: Complications related to the disease process, condition or treatment will be avoided or minimized Outcome: Progressing   Problem: Education: Goal: Knowledge of General Education information will improve Description: Including pain rating scale, medication(s)/side effects and non-pharmacologic comfort measures Outcome: Progressing   Problem: Health Behavior/Discharge Planning: Goal: Ability to manage health-related needs will improve Outcome: Progressing   Problem: Clinical Measurements: Goal: Ability to maintain clinical measurements within normal limits will improve Outcome: Progressing Goal: Will remain free from infection Outcome: Progressing Goal: Diagnostic test results will improve Outcome: Progressing Goal: Respiratory complications will improve Outcome: Progressing Goal: Cardiovascular complication will be avoided Outcome: Progressing   Problem: Activity: Goal: Risk for activity intolerance will decrease Outcome: Progressing   Problem: Nutrition: Goal: Adequate nutrition will be maintained Outcome: Progressing   Problem: Coping: Goal: Level of anxiety will decrease Outcome: Progressing   Problem: Elimination: Goal: Will not experience complications related to bowel motility Outcome: Progressing Goal: Will not experience complications related to urinary retention Outcome: Progressing   Problem: Pain Managment: Goal: General experience of comfort will improve and/or be  controlled Outcome: Progressing   Problem: Safety: Goal: Ability to remain free from injury will improve Outcome: Progressing   Problem: Skin Integrity: Goal: Risk for impaired skin integrity will decrease Outcome: Progressing

## 2023-11-22 NOTE — Progress Notes (Signed)
PROGRESS NOTE    Carolyn Russo  ZOX:[id number] DOB: Mar 16, 1951 DOA: 11/16/2023 PCP: Annita Brod, MD    Brief Narrative: This 73 year old African-American female, past medical history significant for ESRD on HD (MWF), CHF, CVA with residual left-sided weakness, type 2 diabetes, right BKA, and seizure disorder.  Patient presented at [facility] ED on 11/16/23, with confusion and worsening left chest pain radiating to the arm and left-sided shoulder blade pain (back pain).  Patient has had similar presentation (chest and back pain) in the past, with negative workup.  MRI of the brain revealed 1.3 cm acute to early subacute right PICA distribution infarct.  Patient is on aspirin and Brilinta.  Neurology team is directing care.  Workup is still in progress.  Echo revealed EF of 55-60% with moderate left ventricular hypertrophy and mild aortic valve calcification.  Patient troponin was minimally elevated to 200s.  Patient also had a CT chest angiogram done with no evidence of pulmonary embolism.  Intermittent confusion has improved.   Neurology workup is still in progress.  Likely discharge once neurowork-up is completed.    Assessment & Plan:   Principal Problem:   Acute encephalopathy Active Problems:   CAP (community acquired pneumonia)   ESRD on dialysis (HCC)   Troponin I above reference range   Pneumonia  Acute Metabolic encephalopathy, likely multifactorial: This could be secondary to COVID and RSV infection along with acute CVA and seizure disorder . Encephalopathy has improved significantly.   Continue supportive care. Continue delirium precautions. Likely discharge once neurology workup is completed (CVA workup).   Acute CVA: Patient has prior history of CVA with residual left sided weakness. MRI positive for acute to subacute CVA. Continue aspirin and Brilinta  ( DAPT) Continue statin LDL 37, way below goal  <70, TSH 1.5 WNL Neurology input is highly appreciated.  Patient needs  30-day heart monitor at discharge. Neurology signed off.   Seizure disorder : EEG findings noted.  No seizures were seen throughout the recording. Continue Keppra 750 mg twice daily and additional dose after hemodialysis Continue seizure precautions.   Pneumonia due to COVID and RSV along with possible bacterial superinfection: Complete course of antibiotics (ceftriaxone and azithromycin) Continue tapering steroid. PPI for GI prophylaxis Continue Breo Ellipta inhaler and DuoNeb every 6 hourly Mucinex 600 mg p.o. twice daily Procalcitonin 0.86 WBC count within normal range. Continue airborne isolation.   ESRD on hemodialysis, MWF schedule: Continue hemodialysis Monday, Wednesday and Friday. Nephrology following.  Home blood pressure medications resumed.   Diabetes mellitus type 2 Continue NovoLog sliding scale.   DVT prophylaxis: Heparin sq Code Status: Full code Family Communication:No family at bed side Disposition Plan:   Status is: Inpatient Remains inpatient appropriate because: Severity of illness.    Consultants:  Neurology Nephrology  Procedures:  Antimicrobials: Anti-infectives (From admission, onward)    Start     Dose/Rate Route Frequency Ordered Stop   11/16/23 2300  azithromycin (ZITHROMAX) 500 mg in sodium chloride 0.9 % 250 mL IVPB        500 mg 250 mL/hr over 60 Minutes Intravenous Every 24 hours 11/16/23 2112 11/18/23 2300   11/16/23 2200  cefTRIAXone (ROCEPHIN) 2 g in sodium chloride 0.9 % 100 mL IVPB  Status:  Discontinued        2 g 200 mL/hr over 30 Minutes Intravenous Every 24 hours 11/16/23 2112 11/22/23 1032        Subjective: Patient was seen and examined at bedside.  Overnight events noted. Patient reports  feeling much improved.  She was lying comfortably in the bed. She denies any concerns. She is awaiting SNF placement.   Objective: Vitals:   11/22/23 0735 11/22/23 0840 11/22/23 1143 11/22/23 1316  BP:  (!) 147/50 (!) 146/64  133/72  Pulse: 84 89 85   Resp: 16 12 16    Temp:  97.6 F (36.4 C) 97.8 F (36.6 C)   TempSrc:  Oral Oral   SpO2:  100% 99%   Weight:      Height:        Intake/Output Summary (Last 24 hours) at 11/22/2023 1354 Last data filed at 11/22/2023 0840 Gross per 24 hour  Intake 300 ml  Output --  Net 300 ml   Filed Weights   11/20/23 1500 11/20/23 1900  Weight: 79.5 kg 76.8 kg    Examination:  General exam: Appears comfortable, deconditioned, not in any acute distress. Respiratory system: CTA bilaterally. Respiratory effort normal.  RR 14 Cardiovascular system: S1 & S2 heard, RRR. No JVD, murmurs, rubs, gallops or clicks. Gastrointestinal system: Abdomen is non distended, soft and non tender.  Normal bowel sounds heard. Central nervous system: Alert and oriented X 3.  Left-sided weakness from prior stroke. Extremities: Right BKA , No edema, no cyanosis, no clubbing Skin: No rashes, lesions or ulcers Psychiatry: Judgement and insight appear normal. Mood & affect appropriate.     Data Reviewed: I have personally reviewed following labs and imaging studies  CBC: Recent Labs  Lab 11/18/23 0205 11/19/23 0822 11/20/23 0733 11/21/23 0746 11/22/23 1026  WBC 9.0 8.3 9.6 8.7 7.6  NEUTROABS  --   --  6.6  --   --   HGB 10.5* 10.1* 10.7* 11.7* 13.1  HCT 30.9* 30.3* 32.0* 34.4* 37.6  MCV 87.3 87.1 86.7 86.0 85.8  PLT 294 321 338 364 367   Basic Metabolic Panel: Recent Labs  Lab 11/17/23 1200 11/18/23 0205 11/19/23 0822 11/20/23 0733 11/21/23 0746 11/22/23 1026  NA  --  136 138 135 134* 135  K  --  3.7 3.7 3.7 3.6 4.2  CL  --  98 99 97* 96* 94*  CO2  --  26 25 24 26 24   GLUCOSE  --  98 129* 93 93 83  BUN  --  11 29* 39* 19 31*  CREATININE  --  3.15* 5.45* 6.40* 4.18* 5.83*  CALCIUM  --  8.3* 8.1* 8.0* 8.5* 8.9  MG  --  1.9 2.0  --   --   --   PHOS 4.7*  --   --  3.7  --   --    GFR: Estimated Creatinine Clearance: 8.2 mL/min (A) (by C-G formula based on SCr of 5.83  mg/dL (H)). Liver Function Tests: Recent Labs  Lab 11/16/23 2200 11/20/23 0733  AST 18  --   ALT 12  --   ALKPHOS 110  --   BILITOT 0.7  --   PROT 7.3  --   ALBUMIN 2.9* 2.9*   No results for input(s): "LIPASE", "AMYLASE" in the last 168 hours. Recent Labs  Lab 11/16/23 2318  AMMONIA 17   Coagulation Profile: Recent Labs  Lab 11/17/23 0349  INR 1.1   Cardiac Enzymes: No results for input(s): "CKTOTAL", "CKMB", "CKMBINDEX", "TROPONINI" in the last 168 hours. BNP (last 3 results) No results for input(s): "PROBNP" in the last 8760 hours. HbA1C: No results for input(s): "HGBA1C" in the last 72 hours. CBG: Recent Labs  Lab 11/21/23 1145 11/21/23 1557 11/21/23 2144  11/22/23 0618 11/22/23 1146  GLUCAP 115* 229* 183* 92 139*   Lipid Profile: No results for input(s): "CHOL", "HDL", "LDLCALC", "TRIG", "CHOLHDL", "LDLDIRECT" in the last 72 hours. Thyroid Function Tests: No results for input(s): "TSH", "T4TOTAL", "FREET4", "T3FREE", "THYROIDAB" in the last 72 hours. Anemia Panel: No results for input(s): "VITAMINB12", "FOLATE", "FERRITIN", "TIBC", "IRON", "RETICCTPCT" in the last 72 hours. Sepsis Labs: Recent Labs  Lab 11/17/23 0013 11/17/23 0930  PROCALCITON  --  0.86  LATICACIDVEN 0.9  --     Recent Results (from the past 240 hours)  Respiratory (~20 pathogens) panel by PCR     Status: Abnormal   Collection Time: 11/16/23  8:57 PM   Specimen: Nasopharyngeal Swab; Respiratory  Result Value Ref Range Status   Adenovirus NOT DETECTED NOT DETECTED Final   Coronavirus 229E NOT DETECTED NOT DETECTED Final    Comment: (NOTE) The Coronavirus on the Respiratory Panel, DOES NOT test for the novel  Coronavirus (2019 nCoV)    Coronavirus HKU1 NOT DETECTED NOT DETECTED Final   Coronavirus NL63 NOT DETECTED NOT DETECTED Final   Coronavirus OC43 NOT DETECTED NOT DETECTED Final   Metapneumovirus NOT DETECTED NOT DETECTED Final   Rhinovirus / Enterovirus NOT DETECTED NOT  DETECTED Final   Influenza A NOT DETECTED NOT DETECTED Final   Influenza B NOT DETECTED NOT DETECTED Final   Parainfluenza Virus 1 NOT DETECTED NOT DETECTED Final   Parainfluenza Virus 2 NOT DETECTED NOT DETECTED Final   Parainfluenza Virus 3 NOT DETECTED NOT DETECTED Final   Parainfluenza Virus 4 NOT DETECTED NOT DETECTED Final   Respiratory Syncytial Virus DETECTED (A) NOT DETECTED Final   Bordetella pertussis NOT DETECTED NOT DETECTED Final   Bordetella Parapertussis NOT DETECTED NOT DETECTED Final   Chlamydophila pneumoniae NOT DETECTED NOT DETECTED Final   Mycoplasma pneumoniae NOT DETECTED NOT DETECTED Final    Comment: Performed at [facility] Lab, [address], [place], [place] [address]  SARS Coronavirus 2 by RT PCR (hospital order, performed in [facility] hospital lab) *cepheid single result test* Anterior Nasal Swab     Status: Abnormal   Collection Time: 11/16/23  8:57 PM   Specimen: Anterior Nasal Swab  Result Value Ref Range Status   SARS Coronavirus 2 by RT PCR POSITIVE (A) NEGATIVE Final    Comment: Performed at [facility] Lab, [address], [place], [place] [address]  Culture, blood (Routine X 2) w Reflex to ID Panel     Status: None   Collection Time: 11/16/23 10:45 PM   Specimen: BLOOD  Result Value Ref Range Status   Specimen Description BLOOD SITE NOT SPECIFIED  Final   Special Requests   Final    BOTTLES DRAWN AEROBIC AND ANAEROBIC Blood Culture results may not be optimal due to an inadequate volume of blood received in culture bottles   Culture   Final    NO GROWTH 5 DAYS Performed at [facility], [address] [place], [place] [address]    Report Status 11/21/2023 FINAL  Final  Culture, blood (Routine X 2) w Reflex to ID Panel     Status: None   Collection Time: 11/16/23 10:48 PM   Specimen: BLOOD  Result Value Ref Range Status   Specimen Description BLOOD SITE NOT SPECIFIED  Final   Special Requests   Final    BOTTLES DRAWN AEROBIC  AND ANAEROBIC Blood Culture results may not be optimal due to an inadequate volume of blood received in  culture bottles   Culture   Final    NO GROWTH 5 DAYS Performed at [facility] Lab, [address], [place], [place] [address]    Report Status 11/21/2023 FINAL  Final  MRSA Next Gen by PCR, Nasal     Status: None   Collection Time: 11/17/23  2:23 PM   Specimen: Nasal Mucosa; Nasal Swab  Result Value Ref Range Status   MRSA by PCR Next Gen NOT DETECTED NOT DETECTED Final    Comment: (NOTE) The GeneXpert MRSA Assay (FDA approved for NASAL specimens only), is one component of a comprehensive MRSA colonization surveillance program. It is not intended to diagnose MRSA infection nor to guide or monitor treatment for MRSA infections. Test performance is not FDA approved in patients less than 39 years old. Performed at [facility], [address], [place], [place] [address]    Radiology Studies: DG Swallowing Func-Speech Pathology Result Date: 11/21/2023 Table formatting from the original result was not included. Modified Barium Swallow Study Patient Details Name: Carolyn Russo MRN: [id number] Date of Birth: [birth year] Today's Date: 11/21/2023 HPI/PMH: HPI: Patient is a 73 y.o. female with PMH: CVA in 2022 and 2023, right BKA, HTN, DM-2, HLD, ESRD on HD MWF, asthma, dysphagia, CHF, heart murmur, seizure. She presented to the hospital from SNF on 11/16/23 for chest pain, weakness and confusion. CT head revealed infarct in inferior right cerebellum which was new since 06/12/22. MRI brain with acute right inferomedial cerebellar ischemic infarction in the right PICA territory. Pt also diagnosed with Covid/RSV. Clinical Impression: Clinical Impression: Pt exhibits mild oropharyngeal dysphagia characterized by lingual weakness and impaired sensation. She initiates a swallow response which clears the majority of each bolus from her oral cavity, but does not clear it completely. Pt does not achieve  complete BOT retraction and the oral residue progresses through the pharynx to collect in the pyriform sinuses without sensation. All liquid consistencies enters the open laryngeal vestibule, where they are eventually cleared with a cued subsequent swallow or cough (PAS 3). Residue increases with thicker residue, increasing the risk of airway invasion. Of note, pt coughed multiple times throughout the study in the absence of penetration/aspiration. Cueing the pt to immediately initiate a second swallow helped clear the oropharyngeal residue promptly. Biofeedback was used to demonstrate effectiveness of subswallows on function with pt demonstrating understanding. Recommend continuing current diet of Dys 2 solids and thin liquids. SLP will f/u to reinforce use of compensatory strategies. DIGEST Swallow Severity Rating*  Safety: 1  Efficiency: 1  Overall Pharyngeal Swallow Severity: mild 1: mild; 2: moderate; 3: severe; 4: profound *The Dynamic Imaging Grade of Swallowing Toxicity is standardized for the head and neck cancer population, however, demonstrates promising clinical applications across populations to standardize the clinical rating of pharyngeal swallow safety and severity. Factors that may increase risk of adverse event in presence of aspiration Rubye Oaks & Clearance Coots 2021): Factors that may increase risk of adverse event in presence of aspiration Rubye Oaks & Clearance Coots 2021): Reduced cognitive function Recommendations/Plan: Swallowing Evaluation Recommendations Swallowing Evaluation Recommendations Recommendations: PO diet PO Diet Recommendation: Dysphagia 2 (Finely chopped); Thin liquids (Level 0) Liquid Administration via: Cup; Straw Medication Administration: Whole meds with puree Supervision: Staff to assist with self-feeding; Full supervision/cueing for swallowing strategies Swallowing strategies  : Minimize environmental distractions; Slow rate; Small bites/sips; Multiple dry swallows after each bite/sip  Postural changes: Position pt fully upright for meals; Stay upright 30-60 min after meals Oral care recommendations: Oral care BID (  2x/day) Treatment Plan Treatment Plan Treatment recommendations: Therapy as outlined in treatment plan below Follow-up recommendations: Skilled nursing-short term rehab (<3 hours/day) Functional status assessment: Patient has had a recent decline in their functional status and demonstrates the ability to make significant improvements in function in a reasonable and predictable amount of time. Treatment frequency: Min 2x/week Treatment duration: 1 week Interventions: Aspiration precaution training; Compensatory techniques; Patient/family education Recommendations Recommendations for follow up therapy are one component of a multi-disciplinary discharge planning process, led by the attending physician.  Recommendations may be updated based on patient status, additional functional criteria and insurance authorization. Assessment: Orofacial Exam: Orofacial Exam Oral Cavity: Oral Hygiene: WFL Oral Cavity - Dentition: Adequate natural dentition Orofacial Anatomy: WFL Oral Motor/Sensory Function: WFL Anatomy: Anatomy: Suspected cervical osteophytes Boluses Administered: Boluses Administered Boluses Administered: Thin liquids (Level 0); Mildly thick liquids (Level 2, nectar thick); Moderately thick liquids (Level 3, honey thick); Puree; Solid  Oral Impairment Domain: Oral Impairment Domain Lip Closure: No labial escape Tongue control during bolus hold: Posterior escape of greater than half of bolus Bolus preparation/mastication: Timely and efficient chewing and mashing Bolus transport/lingual motion: Brisk tongue motion Oral residue: Residue collection on oral structures Location of oral residue : Tongue Initiation of pharyngeal swallow : Pyriform sinuses  Pharyngeal Impairment Domain: Pharyngeal Impairment Domain Soft palate elevation: No bolus between soft palate (SP)/pharyngeal wall (PW)  Laryngeal elevation: Complete superior movement of thyroid cartilage with complete approximation of arytenoids to epiglottic petiole Anterior hyoid excursion: Partial anterior movement Epiglottic movement: Complete inversion Laryngeal vestibule closure: Complete, no air/contrast in laryngeal vestibule Pharyngeal stripping wave : Present - complete Pharyngeal contraction (A/P view only): N/A Pharyngoesophageal segment opening: Complete distension and complete duration, no obstruction of flow Tongue base retraction: Narrow column of contrast or air between tongue base and PPW Pharyngeal residue: Collection of residue within or on pharyngeal structures Location of pharyngeal residue: Valleculae; Aryepiglottic folds; Pyriform sinuses  Esophageal Impairment Domain: No data recorded Pill: No data recorded Penetration/Aspiration Scale Score: Penetration/Aspiration Scale Score 1.  Material does not enter airway: Solid 2.  Material enters airway, remains ABOVE vocal cords then ejected out: Moderately thick liquids (Level 3, honey thick); Puree 3.  Material enters airway, remains ABOVE vocal cords and not ejected out: Thin liquids (Level 0); Mildly thick liquids (Level 2, nectar thick) Compensatory Strategies: Compensatory Strategies Compensatory strategies: Yes Straw: Effective Effective Straw: Thin liquid (Level 0); Mildly thick liquid (Level 2, nectar thick) Chin tuck: Effective Effective Chin Tuck: Thin liquid (Level 0)   General Information: Caregiver present: No  Diet Prior to this Study: Thin liquids (Level 0); Dysphagia 2 (finely chopped)   Temperature : Normal   Respiratory Status: WFL   Supplemental O2: Nasal cannula   History of Recent Intubation: No  Behavior/Cognition: Alert; Cooperative; Pleasant mood; Confused Self-Feeding Abilities: Needs assist with self-feeding Baseline vocal quality/speech: Normal Volitional Cough: Able to elicit Volitional Swallow: Able to elicit Exam Limitations: No limitations Goal  Planning: Prognosis for improved oropharyngeal function: Good Barriers to Reach Goals: Cognitive deficits; Time post onset No data recorded Patient/Family Stated Goal: none stated Consulted and agree with results and recommendations: Patient Pain: Pain Assessment Pain Assessment: Faces Faces Pain Scale: 2 Pain Location: chest and back Pain Descriptors / Indicators: Aching; Discomfort Pain Intervention(s): Monitored during session End of Session: Start Time:SLP Start Time (ACUTE ONLY): 1511 Stop Time: SLP Stop Time (ACUTE ONLY): 1541 Time Calculation:SLP Time Calculation (min) (ACUTE ONLY): 30 min Charges: SLP Evaluations $ SLP Speech Visit:  1 Visit SLP Evaluations $MBS Swallow: 1 Procedure $Swallowing Treatment: 1 Procedure $Speech Treatment for Individual: 1 Procedure SLP visit diagnosis: SLP Visit Diagnosis: Dysphagia, oropharyngeal phase (R13.12) Past Medical History: Past Medical History: Diagnosis Date  Acute GI bleeding   Allergy   Anemia   Anterior chest wall pain   Appendicitis 1965  Asthma   Body mass index 37.0-37.9, adult   Breast pain   Cataract   both eyes  CHF (congestive heart failure) (HCC)   Cognitive change 04/20/2021  r/t cva 03/2821  Complication of anesthesia   Memory loss after general  Dehydration 2014  Deviated septum 1971  Diabetes mellitus   Dysphagia due to old stroke   easy to get strangled when eating  Dyspnea 2014  ESRD on hemodialysis (HCC)   MWF at [facility]  Extrinsic asthma   WITH ASTHMA ATTACK  Fibroid 1980  GERD (gastroesophageal reflux disease)   Heart murmur   History of migraine   History of seizure   with stroke  Hx gestational diabetes   Hyperlipidemia   Hypertension 2014  Inguinal hernia 1959  Malaise and fatigue 2014  Non-IgE mediated allergic asthma 2014  Obesity   Pelvic pain   Pregnancy, high-risk 1985  Stroke (HCC) 04/20/2021  (CVA) of right basal ganglia  Tonsillitis 1968  Uterine fibroid 1980  Visual field defect   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 Past Surgical History: Past  Surgical History: Procedure Laterality Date  ABDOMINAL AORTOGRAM W/LOWER EXTREMITY N/A 02/21/2022  Procedure: ABDOMINAL AORTOGRAM W/LOWER EXTREMITY;  Surgeon: Maeola Harman, MD;  Location: [facility] INVASIVE CV LAB;  Service: Cardiovascular;  Laterality: N/A;  AMPUTATION Right 05/18/2022  Procedure: RIGHT BELOW KNEE AMPUTATION;  Surgeon: Nadara Mustard, MD;  Location: [facility] OR;  Service: Orthopedics;  Laterality: Right;  AMPUTATION TOE Right 04/15/2022  Procedure: AMPUTATION  LST TOERIGHT FOOT;  Surgeon: Edwin Cap, DPM;  Location: [facility] ORS;  Service: Podiatry;  Laterality: Right;  APPENDECTOMY  1959  BASCILIC VEIN TRANSPOSITION Right 04/30/2021  Procedure: RIGHT FIRST STAGE BASCILIC VEIN TRANSPOSITION;  Surgeon: Chuck Hint, MD;  Location: [facility] OR;  Service: Vascular;  Laterality: Right;  CESAREAN SECTION  1985  COLONOSCOPY    ESOPHAGOGASTRODUODENOSCOPY (EGD) WITH PROPOFOL N/A 04/18/2021  Procedure: ESOPHAGOGASTRODUODENOSCOPY (EGD) WITH PROPOFOL;  Surgeon: Iva Boop, MD;  Location: [facility] ENDOSCOPY;  Service: Endoscopy;  Laterality: N/A;  EYE SURGERY    bilateral cataract   FLEXIBLE SIGMOIDOSCOPY N/A 04/18/2021  Procedure: FLEXIBLE SIGMOIDOSCOPY;  Surgeon: Iva Boop, MD;  Location: [facility] ENDOSCOPY;  Service: Endoscopy;  Laterality: N/A;  HERNIA REPAIR  1959  IR FLUORO GUIDE CV LINE RIGHT  03/18/2021  IR US GUIDE VASC ACCESS RIGHT  03/18/2021  LEFT HEART CATH AND CORONARY ANGIOGRAPHY N/A 04/04/2017  Procedure: Left Heart Cath and Coronary Angiography;  Surgeon: Lyn Records, MD;  Location: [facility] INVASIVE CV LAB;  Service: Cardiovascular;  Laterality: N/A;  MYOMECTOMY  1980, 2004, 2007  PERIPHERAL VASCULAR THROMBECTOMY  02/21/2022  Procedure: PERIPHERAL VASCULAR THROMBECTOMY;  Surgeon: Maeola Harman, MD;  Location: [facility] INVASIVE CV LAB;  Service: Cardiovascular;;  Right Tibial  RHINOPLASTY  1971  ROTATOR CUFF REPAIR  2003  SURGICAL REPAIR OF HEMORRHAGE  2015  TONSILLECTOMY  1968 Gwynneth Aliment, M.A.,  CF-SLP Speech Language Pathology, Acute Rehabilitation Services Secure Chat preferred [phone] 11/21/2023, 4:24 PM   Scheduled Meds:   stroke: early stages of recovery book   Does not apply Once   aspirin EC  81 mg Oral Daily  atorvastatin  40 mg Oral QHS   bisacodyl  10 mg Oral QHS   Chlorhexidine Gluconate Cloth  6 each Topical Q0600   Chlorhexidine Gluconate Cloth  6 each Topical Q0600   fluticasone furoate-vilanterol  1 puff Inhalation Daily   guaiFENesin  600 mg Oral BID   heparin injection (subcutaneous)  5,000 Units Subcutaneous Q8H   hydrALAZINE  100 mg Oral Q8H   insulin aspart  0-5 Units Subcutaneous QHS   insulin aspart  0-6 Units Subcutaneous TID WC   levETIRAcetam  500 mg Oral Once per day on Monday Wednesday Friday   levETIRAcetam  750 mg Oral QHS   lidocaine  1 patch Transdermal Q24H   pantoprazole  40 mg Oral QHS   polyethylene glycol  17 g Oral BID   sevelamer carbonate  800 mg Oral TID WC   sodium chloride flush  3 mL Intravenous Q12H   ticagrelor  90 mg Oral BID   Continuous Infusions:     LOS: 6 days    Time spent: 35 mins    Willeen Niece, MD [facility]   If 7PM-7AM, please contact night-coverage

## 2023-11-22 NOTE — Plan of Care (Signed)
  Problem: Ischemic Stroke/TIA Tissue Perfusion: Goal: Complications of ischemic stroke/TIA will be minimized Outcome: Progressing   Problem: Self-Care: Goal: Ability to communicate needs accurately will improve Outcome: Progressing   Problem: Nutrition: Goal: Risk of aspiration will decrease Outcome: Progressing

## 2023-11-22 NOTE — Progress Notes (Signed)
Nephrology Follow-Up Consult note   Assessment/Recommendations: Carolyn Russo is a/an 73 y.o. female with a past medical history significant for ESRD, admitted for AMS w/ AHRF, CAP, COVID, RSV.       Dialysis Orders: MWF NW  4h   B350    75.5kg   2/2 bath   R fem TDC  Heparin 2600 + 1400 midrun - last HD 1/22, getting to dry wt - 3-4 drops in dry wt in last 3 wks - venofer 100 qhd thru 1/31 - mircera 150 q2, last 1/17, due 1/31   Assessment/Plan: AHRF/ CAP -  +COVID and RSV. Some volume excess. Abx per primary. UF w/ HD AMS/CVA - CVA found mgmt per neurology. AMS multifactorial ESRD - on HD MWF, maintain schedule HTN - BP acceptable Volume - UF as tolerated with HD Anemia of esrd - Hfb at goal. ESA due on 1/31. Secondary hyperparathyroidism - Cont home binders H/o seizure d/o - on Keppra   Recommendations conveyed to primary service.    Darnell Level [facility] 11/22/2023 10:05 AM  ___________________________________________________________  CC: AMS  Interval History/Subjective: Patient tired today and does not answer questions   Medications:  Current Facility-Administered Medications  Medication Dose Route Frequency Provider Last Rate Last Admin    stroke: early stages of recovery book   Does not apply Once Nolberto Hanlon, MD       acetaminophen (TYLENOL) tablet 650 mg  650 mg Oral Q6H PRN Nolberto Hanlon, MD   650 mg at 11/21/23 2235   Or   acetaminophen (TYLENOL) suppository 650 mg  650 mg Rectal Q6H PRN Nolberto Hanlon, MD       aspirin EC tablet 81 mg  81 mg Oral Daily Little Ishikawa, MD   81 mg at 11/21/23 1028   atorvastatin (LIPITOR) tablet 40 mg  40 mg Oral QHS Willeen Niece, MD   40 mg at 11/21/23 2121   bisacodyl (DULCOLAX) EC tablet 10 mg  10 mg Oral QHS Gillis Santa, MD   10 mg at 11/20/23 2229   bisacodyl (DULCOLAX) suppository 10 mg  10 mg Rectal Daily PRN Gillis Santa, MD       cefTRIAXone (ROCEPHIN) 2 g in sodium chloride 0.9 %  100 mL IVPB  2 g Intravenous Q24H Nolberto Hanlon, MD   Stopped at 11/21/23 2255   Chlorhexidine Gluconate Cloth 2 % PADS 6 each  6 each Topical Q0600 Gillis Santa, MD   6 each at 11/21/23 1027   Chlorhexidine Gluconate Cloth 2 % PADS 6 each  6 each Topical Q0600 Oretha Milch, PA-C   6 each at 11/21/23 0501   chlorpheniramine-HYDROcodone (TUSSIONEX) 10-8 MG/5ML suspension 5 mL  5 mL Oral Q12H PRN Gillis Santa, MD   5 mL at 11/20/23 1352   fluticasone furoate-vilanterol (BREO ELLIPTA) 200-25 MCG/ACT 1 puff  1 puff Inhalation Daily Nolberto Hanlon, MD   1 puff at 11/22/23 0729   guaiFENesin (MUCINEX) 12 hr tablet 600 mg  600 mg Oral BID Gillis Santa, MD   600 mg at 11/21/23 2121   heparin injection 5,000 Units  5,000 Units Subcutaneous Q8H Gillis Santa, MD   5,000 Units at 11/22/23 7425   hydrALAZINE (APRESOLINE) injection 10 mg  10 mg Intravenous Q4H PRN Nolberto Hanlon, MD   10 mg at 11/19/23 1100   hydrALAZINE (APRESOLINE) tablet 100 mg  100 mg Oral Q8H Berton Mount I, MD   100 mg at 11/22/23 9563   insulin aspart (novoLOG)  injection 0-5 Units  0-5 Units Subcutaneous QHS Nolberto Hanlon, MD   2 Units at 11/18/23 2103   insulin aspart (novoLOG) injection 0-6 Units  0-6 Units Subcutaneous TID WC Nolberto Hanlon, MD   2 Units at 11/21/23 1659   labetalol (NORMODYNE) injection 20 mg  20 mg Intravenous Q2H PRN Nolberto Hanlon, MD   20 mg at 11/19/23 8119   levETIRAcetam (KEPPRA) tablet 500 mg  500 mg Oral Once per day on Monday Wednesday Friday Willeen Niece, MD       lidocaine (LIDODERM) 5 % 1 patch  1 patch Transdermal Q24H Berton Mount I, MD   1 patch at 11/21/23 2117   LORazepam (ATIVAN) tablet 0.5 mg  0.5 mg Oral Once PRN Howerter, Justin B, DO       methylPREDNISolone sodium succinate (SOLU-MEDROL) 40 mg/mL injection 40 mg  40 mg Intravenous Q24H Gillis Santa, MD   40 mg at 11/21/23 1028   nitroGLYCERIN (NITROSTAT) SL tablet 0.4 mg  0.4 mg Sublingual Q30 min PRN Howerter, Justin B, DO   0.4 mg at  11/17/23 0414   pantoprazole (PROTONIX) injection 40 mg  40 mg Intravenous Q24H Nolberto Hanlon, MD   40 mg at 11/21/23 2121   polyethylene glycol (MIRALAX / GLYCOLAX) packet 17 g  17 g Oral BID Gillis Santa, MD   17 g at 11/19/23 2117   sevelamer carbonate (RENVELA) tablet 800 mg  800 mg Oral TID WC Delano Metz, MD   800 mg at 11/21/23 1658   sodium chloride flush (NS) 0.9 % injection 3 mL  3 mL Intravenous Q12H Nolberto Hanlon, MD   3 mL at 11/21/23 2125   sorbitol, magnesium hydroxide, mineral oil, glycerin (SMOG) enema  960 mL Rectal Daily PRN Gillis Santa, MD       ticagrelor [facility]) tablet 90 mg  90 mg Oral BID Gillis Santa, MD   90 mg at 11/21/23 2121   witch hazel-glycerin (TUCKS) pad   Topical PRN Gillis Santa, MD          Review of Systems: 10 systems reviewed and negative except per interval history/subjective   Physical Exam: Vitals:   11/22/23 0735 11/22/23 0840  BP:  (!) 147/50  Pulse: 84 89  Resp: 16 12  Temp:  97.6 F (36.4 C)  SpO2:  100%   Total I/O In: 300 [IV Piggyback:300] Out: -   Intake/Output Summary (Last 24 hours) at 11/22/2023 1005 Last data filed at 11/22/2023 0840 Gross per 24 hour  Intake 300 ml  Output --  Net 300 ml   Constitutional: Chronically ill-appearing, no acute distress ENMT: ears and nose without scars or lesions, MMM CV: normal rate, no edema Respiratory: bilateral chest rise, normal work of breathing Gastrointestinal: soft, non-tender, nondistended Skin: no visible lesions or rashes Psych: lethargic and not interactive    Test Results I personally reviewed new and old clinical labs and radiology tests Lab Results  Component Value Date   NA 134 (L) 11/21/2023   K 3.6 11/21/2023   CL 96 (L) 11/21/2023   CO2 26 11/21/2023   BUN 19 11/21/2023   CREATININE 4.18 (H) 11/21/2023   GLU 366 06/19/2020   CALCIUM 8.5 (L) 11/21/2023   ALBUMIN 2.9 (L) 11/20/2023   PHOS 3.7 11/20/2023    CBC Recent Labs  Lab 11/19/23 0822  11/20/23 0733 11/21/23 0746  WBC 8.3 9.6 8.7  NEUTROABS  --  6.6  --   HGB 10.1* 10.7* 11.7*  HCT 30.3* 32.0*  34.4*  MCV 87.1 86.7 86.0  PLT 321 338 364

## 2023-11-22 NOTE — Progress Notes (Signed)
Occupational Therapy Treatment Patient Details Name: Carolyn Russo MRN: [id number] DOB: 1951-06-09 Today's Date: 11/22/2023   History of present illness Pt is a 73 yo female presenting to [facility] on 11/16/23 for chest pain, weakness, and confusion. CT head revealed infarct in the inferior right cerebellum which was new since 06/12/2022, MRI brain with acute right inferomedial cerebellar ischemic infarction in the right PICA territory. Pt also diagnosed with Covid/RSV. PMH: R BKA on 7/26, DM2, HTN, HLD, ESRD on HD MWF, hx of CVA 03/2021 & 05/2022, asthma, CHF, dysphagia, heart murmur, seizure   OT comments  OT session focused on training in techniques for increased safety and independence with ADLs and bed mobility and functional tranfers during/in preparation for functional tasks. Pt currently demonstrates ability to complete UB ADLs with Set up to Max assist, LB ADLs with Max to Total assist +2, and functional transfers with use of Stedy with Max to Total assist +2 to come to stand and Min to Mod assist +2 to maintain balance in standing at [facility] with B UE support. Pt requiring cues for sequencing and increased time for processing and motor planning throughout session. Pt on 2L continuous O2 through nasal cannula upon OT and RN arrival with pt O2 sat at 97%. Nasal cannula removed during session with pt's VSS on RA throughout session with pt O2 sats >/91%. Pt placed back on 2L continuous O2 through nasal cannula at end of session. Pt participated well and is making progress toward goals. Pt will benefit from continued acute skilled OT services to address deficits outlined below and increase safety and independence with functional tasks. Post acute discharge, pt will benefit from return to intensive inpatient skilled rehab < 3 hours per day to maximize rehab potential.       If plan is discharge home, recommend the following:      Equipment Recommendations  Other (comment) (defer to next level of care)     Recommendations for Other Services      Precautions / Restrictions Precautions Precautions: Fall Required Braces or Orthoses: Other Brace Other Brace: RLE prosthetic Restrictions Weight Bearing Restrictions Per Provider Order: No RLE Weight Bearing Per Provider Order: Non weight bearing Other Position/Activity Restrictions: R BKA- prosthetic in room       Mobility Bed Mobility Overal bed mobility: Needs Assistance Bed Mobility: Sit to Supine       Sit to supine: Max assist, +2 for physical assistance, +2 for safety/equipment   General bed mobility comments: cues for sequencing, initiation, and hand placement/technique; with increased time    Transfers Overall transfer level: Modified independent Equipment used:  Antony Salmon) Transfers: Sit to/from Stand, Bed to chair/wheelchair/BSC Sit to Stand: Max assist, Total assist, +2 physical assistance, +2 safety/equipment           General transfer comment: Max to Total assist +2 to come to stand at [place] holding bar and to sit safely from [place]. Pt declined donning her R LE prosthetic this session. Expect pt would benefit from donning R LE prosthetic during session for increased independence and safety during functional transfers. Transfer via Lift Equipment: Stedy   Balance Overall balance assessment: Needs assistance Sitting-balance support: Single extremity supported, Feet supported (L foot supported) Sitting balance-Leahy Scale: Fair Sitting balance - Comments: R lean initially with pt able to self-correct   Standing balance support: Bilateral upper extremity supported, During functional activity, Reliant on assistive device for balance Standing balance-Leahy Scale: Poor Standing balance comment: Stood with Stedy with Max to Total  assist +2 to come to stand/sit and Min-Mod assist +2 to maintain stand in [place]                           ADL either performed or assessed with clinical judgement   ADL Overall ADL's  : Needs assistance/impaired Eating/Feeding: Set up;Sitting               Upper Body Dressing : Maximal assistance;Sitting;Cueing for compensatory techniques;Cueing for sequencing       Toilet Transfer: Maximal assistance;Total assistance;+2 for physical assistance;+2 for safety/equipment;Cueing for sequencing;BSC/3in1 (Stedy lift) Toilet Transfer Details (indicate cue type and reason): simulated recliner to bed Toileting- Clothing Manipulation and Hygiene: Maximal assistance;Total assistance;+2 for physical assistance;+2 for safety/equipment;Sit to/from stand         General ADL Comments: Pt with decreased activity tolerance, fatiguing quickly. Pt benefits from encouragement and increased time for processing and motor planning during functional tasks.    Extremity/Trunk Assessment Upper Extremity Assessment Upper Extremity Assessment: Right hand dominant;Generalized weakness;RUE deficits/detail;LUE deficits/detail RUE Deficits / Details: generalized weakness LUE Deficits / Details: generalized weakness; decreased initiation; ~15 degrees active shoulder flexion, 30 degrees shoulder abduction, ~100 degrees elbow flexion, Able to extend digits and make fist all with increased time. AAROM with RUE asisst and min A from OT to bring LUE up onto [facility]. L middle finger trigger finger; decreased proprioception; decreased fine and gross motor coordination LUE Sensation: decreased light touch LUE Coordination: decreased fine motor;decreased gross motor   Lower Extremity Assessment Lower Extremity Assessment: Defer to PT evaluation        Vision   Vision Assessment?: Vision impaired- to be further tested in functional context Additional Comments: pt reports blurriness with distance vision   Perception     Praxis      Cognition Arousal: Lethargic Behavior During Therapy: Flat affect Overall Cognitive Status: Impaired/Different from baseline (No family/caregiver present to determine  baseline) Area of Impairment: Attention, Following commands, Problem solving, Safety/judgement, Awareness                   Current Attention Level: Sustained   Following Commands: Follows one step commands with increased time, Follows one step commands consistently Safety/Judgement: Decreased awareness of safety, Decreased awareness of deficits Awareness: Emergent Problem Solving: Slow processing, Difficulty sequencing, Requires verbal cues, Requires tactile cues General Comments: Needs increased time for processing and motor planning. Pleasant throughoout session.        Exercises      Shoulder Instructions       General Comments Pt on 2L continuous O2 through nasal cannula upon OT and RN arrival with pt O2 sat at 97%. Nasal cannula removed during session with pt's VSS on RA throughout session with pt O2 sats >/91%. Pt placed back on 2L continuous O2 through nasal cannula at end of session. RN present throughout session. NT present during a portion of session.    Pertinent Vitals/ Pain       Pain Assessment Pain Assessment: Faces Faces Pain Scale: Hurts little more Pain Location: Left chest under breast and R UE Pain Descriptors / Indicators: Aching, Discomfort Pain Intervention(s): Limited activity within patient's tolerance, Monitored during session, Repositioned, Other (comment) (RN present and aware)  Home Living  Prior Functioning/Environment              Frequency  Min 1X/week        Progress Toward Goals  OT Goals(current goals can now be found in the care plan section)  Progress towards OT goals: Progressing toward goals  Acute Rehab OT Goals Patient Stated Goal: to eat lunch  Plan      Co-evaluation                 AM-PAC OT "6 Clicks" Daily Activity     Outcome Measure   Help from another person eating meals?: A Little Help from another person taking care of personal  grooming?: A Little Help from another person toileting, which includes using toliet, bedpan, or urinal?: Total Help from another person bathing (including washing, rinsing, drying)?: A Lot Help from another person to put on and taking off regular upper body clothing?: A Lot Help from another person to put on and taking off regular lower body clothing?: Total 6 Click Score: 12    End of Session Equipment Utilized During Treatment: Gait belt;Other (comment);Oxygen Antony Salmon)  OT Visit Diagnosis: Muscle weakness (generalized) (M62.81);Other symptoms and signs involving cognitive function;Pain;Other abnormalities of gait and mobility (R26.89);Unsteadiness on feet (R26.81);Other (comment) (decreased activity tolerance) Hemiplegia - Right/Left: Left Hemiplegia - dominant/non-dominant: Non-Dominant Hemiplegia - caused by: Cerebral infarction   Activity Tolerance Patient tolerated treatment well;Patient limited by fatigue;Other (comment) (Pt limited by current cognitive level)   Patient Left in bed;with call bell/phone within reach;with nursing/sitter in room   Nurse Communication Mobility status;Other (comment) (Pt reports pain in L chest and R UE)        Time: 1610-9604 OT Time Calculation (min): 24 min  Charges: OT General Charges $OT Visit: 1 Visit OT Treatments $Self Care/Home Management : 8-22 mins $Therapeutic Activity: 8-22 mins  Nkenge Sonntag "Orson Eva., OTR/L, MA Acute Rehab [phone]   Lendon Colonel 11/22/2023, 1:57 PM

## 2023-11-22 NOTE — TOC Progression Note (Signed)
Transition of Care [facility]) - Progression Note    Patient Details  Name: Carolyn Russo MRN: [id number] Date of Birth: Aug 08, 1951  Transition of Care [facility] CM/SW Contact  Carley Hammed, LCSW Phone Number: 11/22/2023, 2:48 PM  Clinical Narrative:     CSW was advised by medical team that pt is medically stable for DC back to [facility]. CSW confirmed with facility and facility confirmed they can take with Covid + status. CSW started authorization in [place], currently pending. TOC will continue to follow.   Expected Discharge Plan: Skilled Nursing Facility    Expected Discharge Plan and Services In-house Referral: Clinical Social Work     Living arrangements for the past 2 months: Single Family Home, Skilled Nursing Facility                                       Social Determinants of Health (SDOH) Interventions SDOH Screenings   Food Insecurity: No Food Insecurity (11/17/2023)  Housing: Low Risk  (11/17/2023)  Transportation Needs: No Transportation Needs (11/17/2023)  Utilities: Not At Risk (11/17/2023)  Alcohol Screen: Low Risk  (01/09/2019)  Depression (PHQ2-9): Low Risk  (09/11/2023)  Financial Resource Strain: Low Risk  (09/02/2021)  Physical Activity: Inactive (09/02/2021)  Social Connections: Socially Integrated (11/17/2023)  Stress: Stress Concern Present (09/02/2021)  Tobacco Use: Low Risk  (11/16/2023)    Readmission Risk Interventions    02/14/2023    4:54 PM  Readmission Risk Prevention Plan  Transportation Screening Complete  PCP or Specialist Appt within 3-5 Days Complete  HRI or Home Care Consult Complete  Social Work Consult for Recovery Care Planning/Counseling Complete  Palliative Care Screening Complete  Medication Review [employer]) Complete

## 2023-11-22 NOTE — Progress Notes (Signed)
Physical Therapy Treatment Patient Details Name: Carolyn Russo MRN: [id number] DOB: Sep 03, 1951 Today's Date: 11/22/2023   History of Present Illness Pt is a 73 yo female presenting to [facility] on 11/16/23 for chest pain, weakness, and confusion. CT head revealed infarct in the inferior right cerebellum which was new since 06/12/2022, MRI brain with acute right inferomedial cerebellar ischemic infarction in the right PICA territory. Pt also diagnosed with Covid/RSV. PMH: R BKA on 7/26, DM2, HTN, HLD, ESRD on HD MWF, hx of CVA 03/2021 & 05/2022, asthma, CHF, dysphagia, heart murmur, seizure    PT Comments  Pt is slowly progressing towards goals. Max A for bed mobility and Lateral scoot to chair with assist from second person for safety to stabilize recliner. Pt requires increased cues this session with maximum multi modal cues for body mechanics and sequencing of bed mobility and transfers. Due to pt current functional status, home set up and available assistance at home recommending skilled physical therapy services < 3 hours/day in order to address strength, balance and functional mobility to decrease risk for falls, injury, immobility, skin break down and re-hospitalization.      If plan is discharge home, recommend the following: Assistance with cooking/housework;Assist for transportation;Help with stairs or ramp for entrance;Two people to help with walking and/or transfers   Can travel by private vehicle     No  Equipment Recommendations  Other (comment) (defer to post acute)       Precautions / Restrictions Precautions Precautions: Fall Restrictions Weight Bearing Restrictions Per Provider Order: No RLE Weight Bearing Per Provider Order: Non weight bearing Other Position/Activity Restrictions: R BKA- prosthetic in room     Mobility  Bed Mobility Overal bed mobility: Needs Assistance Bed Mobility: Supine to Sit     Supine to sit: Max assist     General bed mobility comments: Pt is  Max A for supine to sitting with heavy verbal cues and slow processing requiring increased time for movement.    Transfers Overall transfer level: Modified independent Equipment used: None Transfers: Bed to chair/wheelchair/BSC            Lateral/Scoot Transfers: Max assist, +2 safety/equipment General transfer comment: Max A with one person for L lateral scoot transfers with 2nd person to steady chair to prevent chair from sliding. Pt requires maximum multimodal cueing for body mechanics and sequencing for safe transfer and was anxious throughout stating " I am going to fall" when pt was solidly on surface of bed or chair.    Ambulation/Gait   General Gait Details: Unable to attempt at this time due to current level of arousal.     Balance Overall balance assessment: Needs assistance Sitting-balance support: Single extremity supported, Feet supported Sitting balance-Leahy Scale: Fair Sitting balance - Comments: R lean initially, pt was able to correct; stated she was falling but was not falling.       Standing balance comment: did not stand today        Cognition Arousal: Lethargic Behavior During Therapy: Anxious Overall Cognitive Status: No family/caregiver present to determine baseline cognitive functioning Area of Impairment: Attention, Following commands, Problem solving, Safety/judgement, Awareness     Following Commands: Follows one step commands with increased time Safety/Judgement: Decreased awareness of safety, Decreased awareness of deficits   Problem Solving: Slow processing General Comments: Slow with commands, needs increased time. Needs for body mechanics and step by step instructions for movement for transfer.           General Comments  General comments (skin integrity, edema, etc.): O2 sats remained in the upper 90's and HR WNL throughout session.      Pertinent Vitals/Pain Pain Assessment Pain Assessment: Faces Faces Pain Scale: Hurts little  more Breathing: normal Negative Vocalization: none Facial Expression: smiling or inexpressive Body Language: relaxed Consolability: no need to console PAINAD Score: 0 Pain Location: RUE. Pt states her RUE hurts normally Pain Descriptors / Indicators: Aching, Discomfort Pain Intervention(s): Patient requesting pain meds-RN notified     PT Goals (current goals can now be found in the care plan section) Acute Rehab PT Goals Patient Stated Goal: return to rehab to improve mobility PT Goal Formulation: With patient Time For Goal Achievement: 12/02/23 Potential to Achieve Goals: Fair Progress towards PT goals: Progressing toward goals    Frequency    Min 1X/week      PT Plan  Continue with current POC        AM-PAC PT "6 Clicks" Mobility   Outcome Measure  Help needed turning from your back to your side while in a flat bed without using bedrails?: A Lot Help needed moving from lying on your back to sitting on the side of a flat bed without using bedrails?: A Lot Help needed moving to and from a bed to a chair (including a wheelchair)?: A Lot Help needed standing up from a chair using your arms (e.g., wheelchair or bedside chair)?: Total Help needed to walk in hospital room?: Total Help needed climbing 3-5 steps with a railing? : Total 6 Click Score: 9    End of Session Equipment Utilized During Treatment: Gait belt Activity Tolerance: Patient limited by fatigue;Patient tolerated treatment well Patient left: with call bell/phone within reach;in chair;with chair alarm set Nurse Communication: Mobility status;Need for lift equipment PT Visit Diagnosis: Other abnormalities of gait and mobility (R26.89);Muscle weakness (generalized) (M62.81);Unsteadiness on feet (R26.81);Difficulty in walking, not elsewhere classified (R26.2)     Time: 1610-9604 PT Time Calculation (min) (ACUTE ONLY): 27 min  Charges:    $Therapeutic Activity: 23-37 mins PT General Charges $$ ACUTE PT  VISIT: 1 Visit          Harrel Carina, DPT, CLT  Acute Rehabilitation Services Office: [phone] (Secure chat preferred)    Claudia Desanctis 11/22/2023, 10:38 AM

## 2023-11-22 NOTE — Progress Notes (Signed)
Patient had a coughing fit which brought on sudden left chest pain, "right upper left breast," per patient. Tylenol given. Pain improved on reassessment of pain.

## 2023-11-23 ENCOUNTER — Inpatient Hospital Stay (HOSPITAL_COMMUNITY): Payer: Medicare PPO

## 2023-11-23 DIAGNOSIS — U071 COVID-19: Secondary | ICD-10-CM | POA: Diagnosis not present

## 2023-11-23 DIAGNOSIS — N179 Acute kidney failure, unspecified: Secondary | ICD-10-CM

## 2023-11-23 DIAGNOSIS — J189 Pneumonia, unspecified organism: Secondary | ICD-10-CM | POA: Diagnosis not present

## 2023-11-23 DIAGNOSIS — G9340 Encephalopathy, unspecified: Secondary | ICD-10-CM | POA: Diagnosis not present

## 2023-11-23 DIAGNOSIS — I469 Cardiac arrest, cause unspecified: Secondary | ICD-10-CM

## 2023-11-23 LAB — POCT I-STAT 7, (LYTES, BLD GAS, ICA,H+H)
Acid-Base Excess: 1 mmol/L (ref 0.0–2.0)
Bicarbonate: 25.5 mmol/L (ref 20.0–28.0)
Calcium, Ion: 1.09 mmol/L — ABNORMAL LOW (ref 1.15–1.40)
HCT: 42 % (ref 36.0–46.0)
Hemoglobin: 14.3 g/dL (ref 12.0–15.0)
O2 Saturation: 100 %
Patient temperature: 97.7
Potassium: 3.5 mmol/L (ref 3.5–5.1)
Sodium: 133 mmol/L — ABNORMAL LOW (ref 135–145)
TCO2: 27 mmol/L (ref 22–32)
pCO2 arterial: 40.4 mm[Hg] (ref 32–48)
pH, Arterial: 7.406 (ref 7.35–7.45)
pO2, Arterial: 509 mm[Hg] — ABNORMAL HIGH (ref 83–108)

## 2023-11-23 LAB — CBC
HCT: 36.2 % (ref 36.0–46.0)
Hemoglobin: 12.6 g/dL (ref 12.0–15.0)
MCH: 29.4 pg (ref 26.0–34.0)
MCHC: 34.8 g/dL (ref 30.0–36.0)
MCV: 84.6 fL (ref 80.0–100.0)
Platelets: 311 10*3/uL (ref 150–400)
RBC: 4.28 MIL/uL (ref 3.87–5.11)
RDW: 19.2 % — ABNORMAL HIGH (ref 11.5–15.5)
WBC: 14.1 10*3/uL — ABNORMAL HIGH (ref 4.0–10.5)
nRBC: 1.1 % — ABNORMAL HIGH (ref 0.0–0.2)

## 2023-11-23 LAB — BASIC METABOLIC PANEL
Anion gap: 18 — ABNORMAL HIGH (ref 5–15)
BUN: 21 mg/dL (ref 8–23)
CO2: 22 mmol/L (ref 22–32)
Calcium: 8.7 mg/dL — ABNORMAL LOW (ref 8.9–10.3)
Chloride: 93 mmol/L — ABNORMAL LOW (ref 98–111)
Creatinine, Ser: 4.62 mg/dL — ABNORMAL HIGH (ref 0.44–1.00)
GFR, Estimated: 9 mL/min — ABNORMAL LOW (ref >60)
Glucose, Bld: 222 mg/dL — ABNORMAL HIGH (ref 70–99)
Potassium: 3.4 mmol/L — ABNORMAL LOW (ref 3.5–5.1)
Sodium: 133 mmol/L — ABNORMAL LOW (ref 135–145)

## 2023-11-23 LAB — GLUCOSE, CAPILLARY
Glucose-Capillary: 98 mg/dL (ref 70–99)
Glucose-Capillary: 221 mg/dL — ABNORMAL HIGH (ref 70–99)
Glucose-Capillary: 197 mg/dL — ABNORMAL HIGH (ref 70–99)
Glucose-Capillary: 145 mg/dL — ABNORMAL HIGH (ref 70–99)
Glucose-Capillary: 114 mg/dL — ABNORMAL HIGH (ref 70–99)
Glucose-Capillary: 111 mg/dL — ABNORMAL HIGH (ref 70–99)

## 2023-11-23 LAB — MAGNESIUM: Magnesium: 1.8 mg/dL (ref 1.7–2.4)

## 2023-11-23 MED ORDER — MIDAZOLAM HCL 2 MG/2ML IJ SOLN
1.0000 mg | INTRAMUSCULAR | Status: DC | PRN
  Administered 2023-11-23 – 2023-11-24 (×4): 2 mg via INTRAVENOUS
  Filled 2023-11-23 (×4): qty 2

## 2023-11-23 MED ORDER — VANCOMYCIN HCL 1750 MG/350ML IV SOLN
1750.0000 mg | Freq: Once | INTRAVENOUS | Status: AC
  Administered 2023-11-23: 1750 mg via INTRAVENOUS
  Filled 2023-11-23: qty 350

## 2023-11-23 MED ORDER — FENTANYL 2500MCG IN NS 250ML (10MCG/ML) PREMIX INFUSION
25.0000 ug/h | INTRAVENOUS | Status: DC
  Administered 2023-11-23: 25 ug/h via INTRAVENOUS
  Administered 2023-11-24: 150 ug/h via INTRAVENOUS
  Administered 2023-11-24 – 2023-11-26 (×3): 50 ug/h via INTRAVENOUS
  Filled 2023-11-23 (×3): qty 250

## 2023-11-23 MED ORDER — ONDANSETRON HCL 4 MG/2ML IJ SOLN
4.0000 mg | Freq: Four times a day (QID) | INTRAMUSCULAR | Status: DC | PRN
  Filled 2023-11-23: qty 2

## 2023-11-23 MED ORDER — LEVETIRACETAM IN NACL 500 MG/100ML IV SOLN
500.0000 mg | Freq: Two times a day (BID) | INTRAVENOUS | Status: DC
  Administered 2023-11-23 – 2023-12-01 (×17): 500 mg via INTRAVENOUS
  Filled 2023-11-23 (×17): qty 100

## 2023-11-23 MED ORDER — VANCOMYCIN HCL 750 MG/150ML IV SOLN
750.0000 mg | INTRAVENOUS | Status: DC
  Filled 2023-11-23: qty 150

## 2023-11-23 MED ORDER — FAMOTIDINE 20 MG PO TABS
20.0000 mg | ORAL_TABLET | Freq: Every day | ORAL | Status: DC
  Administered 2023-11-23 – 2023-11-26 (×4): 20 mg
  Filled 2023-11-23 (×4): qty 1

## 2023-11-23 MED ORDER — HEPARIN SOD (PORK) LOCK FLUSH 100 UNIT/ML IV SOLN
500.0000 [IU] | Freq: Once | INTRAVENOUS | Status: DC
  Filled 2023-11-23: qty 5

## 2023-11-23 MED ORDER — FAMOTIDINE 20 MG PO TABS: 20.0000 mg | ORAL_TABLET | Freq: Two times a day (BID) | ORAL | Status: DC

## 2023-11-23 MED ORDER — SODIUM CHLORIDE 0.9 % IV SOLN
750.0000 mg | Freq: Every day | INTRAVENOUS | Status: DC
  Filled 2023-11-23: qty 7.5

## 2023-11-23 MED ORDER — NOREPINEPHRINE 4 MG/250ML-% IV SOLN
0.0000 ug/min | INTRAVENOUS | Status: AC
Start: 2023-11-23 — End: ?
  Administered 2023-11-23 – 2023-11-24 (×2): 2 ug/min via INTRAVENOUS
  Administered 2023-11-24: 1 ug/min via INTRAVENOUS
  Administered 2023-11-25: 2 ug/min via INTRAVENOUS
  Administered 2023-11-25: 1 ug/min via INTRAVENOUS
  Administered 2023-11-26: 2 ug/min via INTRAVENOUS
  Administered 2023-11-27: 1 ug/min via INTRAVENOUS
  Administered 2023-11-28: 2 ug/min via INTRAVENOUS
  Filled 2023-11-23 (×3): qty 250

## 2023-11-23 MED ORDER — MIDAZOLAM HCL 2 MG/2ML IJ SOLN
2.0000 mg | INTRAMUSCULAR | Status: AC
  Administered 2023-11-23: 2 mg via INTRAVENOUS

## 2023-11-23 MED ORDER — TICAGRELOR 90 MG PO TABS
90.0000 mg | ORAL_TABLET | Freq: Two times a day (BID) | ORAL | Status: DC
  Administered 2023-11-23 – 2023-12-15 (×46): 90 mg
  Filled 2023-11-23 (×48): qty 1

## 2023-11-23 MED ORDER — DOCUSATE SODIUM 100 MG PO CAPS
100.0000 mg | ORAL_CAPSULE | Freq: Two times a day (BID) | ORAL | Status: DC | PRN
Start: 2023-11-23 — End: 2023-11-27

## 2023-11-23 MED ORDER — FENTANYL BOLUS VIA INFUSION
25.0000 ug | INTRAVENOUS | Status: DC | PRN
  Administered 2023-11-23: 100 ug via INTRAVENOUS
  Administered 2023-11-23: 25 ug via INTRAVENOUS
  Administered 2023-11-23: 50 ug via INTRAVENOUS
  Administered 2023-11-23: 25 ug via INTRAVENOUS
  Administered 2023-11-23: 75 ug via INTRAVENOUS
  Administered 2023-11-23: 100 ug via INTRAVENOUS
  Administered 2023-11-23: 50 ug via INTRAVENOUS
  Administered 2023-11-23: 75 ug via INTRAVENOUS
  Administered 2023-11-24 (×3): 100 ug via INTRAVENOUS

## 2023-11-23 MED ORDER — SODIUM CHLORIDE 0.9 % IV SOLN
2.0000 g | Freq: Once | INTRAVENOUS | Status: AC
  Administered 2023-11-23: 2 g via INTRAVENOUS
  Filled 2023-11-23: qty 12.5

## 2023-11-23 MED ORDER — LEVETIRACETAM IN NACL 500 MG/100ML IV SOLN: 500.0000 mg | INTRAVENOUS | Status: DC

## 2023-11-23 MED ORDER — POTASSIUM CHLORIDE 20 MEQ PO PACK
20.0000 meq | PACK | Freq: Once | ORAL | Status: AC
  Administered 2023-11-23: 20 meq
  Filled 2023-11-23: qty 1

## 2023-11-23 MED ORDER — SEVELAMER CARBONATE 800 MG PO TABS
800.0000 mg | ORAL_TABLET | Freq: Three times a day (TID) | ORAL | Status: DC
Start: 2023-11-23 — End: 2023-11-29
  Administered 2023-11-23 – 2023-11-29 (×16): 800 mg
  Filled 2023-11-23 (×16): qty 1

## 2023-11-23 MED ORDER — ASPIRIN 81 MG PO CHEW
81.0000 mg | CHEWABLE_TABLET | Freq: Every day | ORAL | Status: DC
  Administered 2023-11-23 – 2023-12-15 (×23): 81 mg
  Filled 2023-11-23 (×23): qty 1

## 2023-11-23 MED ORDER — FENTANYL CITRATE PF 50 MCG/ML IJ SOSY
50.0000 ug | PREFILLED_SYRINGE | INTRAMUSCULAR | Status: AC
Start: 2023-11-23 — End: 2023-11-23
  Administered 2023-11-23: 50 ug via INTRAVENOUS

## 2023-11-23 MED ORDER — ROCURONIUM BROMIDE 10 MG/ML (PF) SYRINGE
50.0000 mg | PREFILLED_SYRINGE | INTRAVENOUS | Status: AC
  Administered 2023-11-23: 50 mg via INTRAVENOUS

## 2023-11-23 MED ORDER — POLYETHYLENE GLYCOL 3350 17 G PO PACK: 17.0000 g | PACK | Freq: Every day | ORAL | Status: DC | PRN

## 2023-11-23 MED ORDER — INSULIN ASPART 100 UNIT/ML IJ SOLN
0.0000 [IU] | INTRAMUSCULAR | Status: DC
  Administered 2023-11-23: 2 [IU] via SUBCUTANEOUS

## 2023-11-23 MED ORDER — INSULIN ASPART 100 UNIT/ML IJ SOLN
0.0000 [IU] | INTRAMUSCULAR | Status: DC
  Administered 2023-11-24 – 2023-11-25 (×4): 2 [IU] via SUBCUTANEOUS
  Administered 2023-11-25 – 2023-11-26 (×2): 3 [IU] via SUBCUTANEOUS
  Administered 2023-11-26: 5 [IU] via SUBCUTANEOUS
  Administered 2023-11-26: 2 [IU] via SUBCUTANEOUS
  Administered 2023-11-26: 5 [IU] via SUBCUTANEOUS
  Administered 2023-11-27: 2 [IU] via SUBCUTANEOUS
  Administered 2023-11-27: 5 [IU] via SUBCUTANEOUS
  Administered 2023-11-27 (×2): 3 [IU] via SUBCUTANEOUS
  Administered 2023-11-27: 2 [IU] via SUBCUTANEOUS
  Administered 2023-11-27: 5 [IU] via SUBCUTANEOUS
  Administered 2023-11-27 – 2023-11-28 (×3): 2 [IU] via SUBCUTANEOUS
  Administered 2023-11-28: 5 [IU] via SUBCUTANEOUS
  Administered 2023-11-29: 3 [IU] via SUBCUTANEOUS
  Administered 2023-11-29: 2 [IU] via SUBCUTANEOUS
  Administered 2023-11-29: 5 [IU] via SUBCUTANEOUS
  Administered 2023-11-29: 2 [IU] via SUBCUTANEOUS
  Administered 2023-11-30 (×2): 3 [IU] via SUBCUTANEOUS
  Administered 2023-11-30: 5 [IU] via SUBCUTANEOUS
  Administered 2023-11-30: 3 [IU] via SUBCUTANEOUS
  Administered 2023-11-30: 2 [IU] via SUBCUTANEOUS
  Administered 2023-12-01: 3 [IU] via SUBCUTANEOUS
  Administered 2023-12-01: 2 [IU] via SUBCUTANEOUS
  Administered 2023-12-01 – 2023-12-02 (×3): 3 [IU] via SUBCUTANEOUS
  Administered 2023-12-02 (×2): 2 [IU] via SUBCUTANEOUS
  Administered 2023-12-02 (×2): 3 [IU] via SUBCUTANEOUS
  Administered 2023-12-02: 2 [IU] via SUBCUTANEOUS
  Administered 2023-12-03: 3 [IU] via SUBCUTANEOUS
  Administered 2023-12-03 (×2): 2 [IU] via SUBCUTANEOUS
  Administered 2023-12-03: 3 [IU] via SUBCUTANEOUS
  Administered 2023-12-03: 5 [IU] via SUBCUTANEOUS
  Administered 2023-12-03 – 2023-12-04 (×2): 2 [IU] via SUBCUTANEOUS
  Administered 2023-12-04 (×3): 3 [IU] via SUBCUTANEOUS
  Administered 2023-12-04: 2 [IU] via SUBCUTANEOUS
  Administered 2023-12-04: 1 [IU] via SUBCUTANEOUS
  Administered 2023-12-05 (×3): 3 [IU] via SUBCUTANEOUS
  Administered 2023-12-05: 2 [IU] via SUBCUTANEOUS
  Administered 2023-12-05: 3 [IU] via SUBCUTANEOUS
  Administered 2023-12-05 – 2023-12-06 (×2): 2 [IU] via SUBCUTANEOUS
  Administered 2023-12-06: 3 [IU] via SUBCUTANEOUS
  Administered 2023-12-06: 2 [IU] via SUBCUTANEOUS
  Administered 2023-12-06 – 2023-12-07 (×4): 3 [IU] via SUBCUTANEOUS
  Administered 2023-12-07 (×3): 5 [IU] via SUBCUTANEOUS
  Administered 2023-12-07: 3 [IU] via SUBCUTANEOUS
  Administered 2023-12-07 – 2023-12-08 (×4): 5 [IU] via SUBCUTANEOUS
  Administered 2023-12-08: 3 [IU] via SUBCUTANEOUS
  Administered 2023-12-08: 5 [IU] via SUBCUTANEOUS
  Administered 2023-12-09: 3 [IU] via SUBCUTANEOUS
  Administered 2023-12-09 (×2): 2 [IU] via SUBCUTANEOUS
  Administered 2023-12-09: 5 [IU] via SUBCUTANEOUS
  Administered 2023-12-09 – 2023-12-10 (×3): 2 [IU] via SUBCUTANEOUS
  Administered 2023-12-10: 3 [IU] via SUBCUTANEOUS
  Administered 2023-12-11: 2 [IU] via SUBCUTANEOUS
  Administered 2023-12-11 (×2): 5 [IU] via SUBCUTANEOUS
  Administered 2023-12-11: 2 [IU] via SUBCUTANEOUS
  Administered 2023-12-11: 5 [IU] via SUBCUTANEOUS
  Administered 2023-12-12: 8 [IU] via SUBCUTANEOUS
  Administered 2023-12-12: 5 [IU] via SUBCUTANEOUS
  Administered 2023-12-12 (×2): 3 [IU] via SUBCUTANEOUS
  Administered 2023-12-12: 8 [IU] via SUBCUTANEOUS
  Administered 2023-12-12: 3 [IU] via SUBCUTANEOUS
  Administered 2023-12-12: 5 [IU] via SUBCUTANEOUS
  Administered 2023-12-13: 2 [IU] via SUBCUTANEOUS
  Administered 2023-12-13: 3 [IU] via SUBCUTANEOUS
  Administered 2023-12-13 (×3): 5 [IU] via SUBCUTANEOUS
  Administered 2023-12-13 – 2023-12-14 (×3): 3 [IU] via SUBCUTANEOUS
  Administered 2023-12-14 (×2): 5 [IU] via SUBCUTANEOUS
  Administered 2023-12-15: 3 [IU] via SUBCUTANEOUS
  Administered 2023-12-15: 2 [IU] via SUBCUTANEOUS
  Administered 2023-12-15: 5 [IU] via SUBCUTANEOUS
  Administered 2023-12-15: 3 [IU] via SUBCUTANEOUS
  Administered 2023-12-15 (×2): 5 [IU] via SUBCUTANEOUS
  Administered 2023-12-16: 3 [IU] via SUBCUTANEOUS
  Administered 2023-12-16: 5 [IU] via SUBCUTANEOUS
  Administered 2023-12-16 – 2023-12-17 (×2): 3 [IU] via SUBCUTANEOUS
  Administered 2023-12-17: 8 [IU] via SUBCUTANEOUS
  Administered 2023-12-17: 3 [IU] via SUBCUTANEOUS
  Administered 2023-12-17: 5 [IU] via SUBCUTANEOUS
  Administered 2023-12-18 – 2023-12-19 (×5): 3 [IU] via SUBCUTANEOUS
  Administered 2023-12-19 (×2): 2 [IU] via SUBCUTANEOUS
  Administered 2023-12-19: 3 [IU] via SUBCUTANEOUS
  Administered 2023-12-20 – 2023-12-21 (×6): 2 [IU] via SUBCUTANEOUS

## 2023-11-23 MED ORDER — HEPARIN SODIUM (PORCINE) 1000 UNIT/ML IJ SOLN
INTRAMUSCULAR | Status: AC
  Administered 2023-11-24: 1000 [IU] via INTRAVENOUS_CENTRAL
  Filled 2023-11-23: qty 1

## 2023-11-23 MED ORDER — LACTATED RINGERS IV BOLUS
1000.0000 mL | Freq: Once | INTRAVENOUS | Status: AC
  Administered 2023-11-23: 1000 mL via INTRAVENOUS

## 2023-11-23 MED ORDER — ETOMIDATE 2 MG/ML IV SOLN
20.0000 mg | INTRAVENOUS | Status: AC
  Administered 2023-11-23: 20 mg via INTRAVENOUS

## 2023-11-23 MED ORDER — VANCOMYCIN VARIABLE DOSE PER UNSTABLE RENAL FUNCTION (PHARMACIST DOSING): Status: DC

## 2023-11-23 MED ORDER — SODIUM CHLORIDE 0.9 % IV SOLN
2.0000 g | INTRAVENOUS | Status: DC
Start: 2023-11-24 — End: 2023-11-24

## 2023-11-23 MED ORDER — ORAL CARE MOUTH RINSE
15.0000 mL | OROMUCOSAL | Status: DC
  Administered 2023-11-23 – 2023-12-03 (×122): 15 mL via OROMUCOSAL

## 2023-11-23 MED ORDER — FENTANYL CITRATE PF 50 MCG/ML IJ SOSY
25.0000 ug | PREFILLED_SYRINGE | Freq: Once | INTRAMUSCULAR | Status: AC
  Administered 2023-11-23: 25 ug via INTRAVENOUS

## 2023-11-23 MED ORDER — ORAL CARE MOUTH RINSE: 15.0000 mL | OROMUCOSAL | Status: DC | PRN

## 2023-11-23 MED ORDER — POTASSIUM CHLORIDE 10 MEQ/100ML IV SOLN: 10.0000 meq | INTRAVENOUS | Status: DC

## 2023-11-23 NOTE — Progress Notes (Signed)
   11/23/23 0405  Pain Assessment  Pain Scale 0-10  Pain Score 0  Hemodialysis Catheter Right Subclavian  No placement date or time found.   Orientation: Right  Access Location: Subclavian  Site Condition No complications  Dressing Type Transparent  Dressing Status Antimicrobial disc/dressing in place  Drainage Description None  Neurological  Level of Consciousness Alert  Orientation Level Oriented to person;Oriented to place  RUE Motor Strength 5  LUE Motor Strength 5  RLE Motor Strength Other (Comment) (Need assistance for movement)  LLE Motor Strength Other (Comment) (need assistance for movement)  Respiratory  Respiratory Pattern Regular  Chest Assessment Chest expansion symmetrical  Bilateral Breath Sounds Clear  R Upper  Breath Sounds Clear  L Upper Breath Sounds Clear  Cardiac  Pulse Regular  Jugular Venous Distention (JVD) No  ECG Monitor Yes  Cardiac Rhythm NSR  Ectopy Unifocal PVC's  Ectopy Frequency Rare  Vascular  Edema Generalized  Psychosocial  Psychosocial (WDL) WDL  Patient Behaviors Appropriate for situation  Emotional support given Given to patient

## 2023-11-23 NOTE — Procedures (Signed)
Central Venous Catheter Insertion Procedure Note  Carolyn Russo  [id number]  12-Dec-1950  Date:11/23/23  Time:8:17 AM   Provider Performing:Labrenda Lasky Bea Laura  Cherlynn Polo   Procedure: Insertion of Non-tunneled Central Venous Catheter(36556) with US guidance (46962)   Indication(s) Medication administration and Difficult access  Consent Unable to obtain consent due to emergent nature of procedure.  Anesthesia Topical only with 1% lidocaine   Timeout Verified patient identification, verified procedure, site/side was marked, verified correct patient position, special equipment/implants available, medications/allergies/relevant history reviewed, required imaging and test results available.  Sterile Technique Maximal sterile technique including full sterile barrier drape, hand hygiene, sterile gown, sterile gloves, mask, hair covering, sterile ultrasound probe cover (if used).  Procedure Description Area of catheter insertion was cleaned with chlorhexidine and draped in sterile fashion.  With real-time ultrasound guidance a central venous catheter was placed into the right femoral vein. Nonpulsatile blood flow and easy flushing noted in all ports.  The catheter was sutured in place and sterile dressing applied.    Complications/Tolerance None; patient tolerated the procedure well. Chest X-ray is ordered to verify placement for internal jugular or subclavian cannulation.   Chest x-ray is not ordered for femoral cannulation.  EBL Minimal  Specimen(s) None   Cristopher Peru, PA-C [facility] 11/23/23 8:18 AM  Please see [email or web address] for pager details.  From 7A-7P if no response, please call [phone] After hours, please call ELink [phone]

## 2023-11-23 NOTE — Care Plan (Signed)
Patient went into cardiac arrest, CODE BLUE was initiated . CPR/ACLS protocol followed.  Patient moved to ICU and intubated.  Patient remains under ICU team.  Will sign off.

## 2023-11-23 NOTE — Procedures (Signed)
Intubation Procedure Note  Carolyn Russo  [id number]  1950/11/27  Date:11/23/23  Time:7:19 AM   Provider Performing:Janazia Schreier E  Cherlynn Polo    Procedure: Intubation (31500)  Indication(s) Respiratory Failure  Consent Unable to obtain consent due to emergent nature of procedure.   Anesthesia Etomidate, Versed, Fentanyl, and Rocuronium   Time Out Verified patient identification, verified procedure, site/side was marked, verified correct patient position, special equipment/implants available, medications/allergies/relevant history reviewed, required imaging and test results available.   Sterile Technique Usual hand hygeine, masks, and gloves were used   Procedure Description Patient positioned in bed supine.  Sedation given as noted above.  Patient was intubated with endotracheal tube using Glidescope.  View was Grade 1 full glottis .  Number of attempts was 1.  Colorimetric CO2 detector was consistent with tracheal placement.   Complications/Tolerance None; patient tolerated the procedure well. Chest X-ray is ordered to verify placement.   EBL Minimal   Specimen(s) None  With Briant Sites, MD   Cristopher Peru, PA-C [facility] 11/23/23 7:19 AM  Please see [email or web address] for pager details.  From 7A-7P if no response, please call [phone] After hours, please call ELink [phone]

## 2023-11-23 NOTE — Consult Note (Signed)
NAME:  Carolyn Russo, MRN:  [id number], DOB:  1951-01-27, LOS: 7 ADMISSION DATE:  11/16/2023, CONSULTATION DATE:  1/30 REFERRING MD:  TRH, CHIEF COMPLAINT:  code blue   History of Present Illness:  Carolyn Russo is a 73 yo female with ESRD on HD MWF, CHF, previous CVA with residual left sided weakness, T2DM, right BKA, and seizure disorder. She presented to [facility] on 1/23 with confusion, left sided chest pain, shortness of breath, and coughing. She was found to be hypoxic on room air and placed on 2 L [place]. CT chest was done without evidence of PE.  MRI revealed a 1.3cm acute to early subacute right PICA infarct. Also was admitted with pneumonia due to COVID and RSV and was started on antibiotics for likely superimposed bacterial infection.   On 1/30 early morning the patient was ~2 hrs into dialysis when her blood pressure dropped. UF goal was decreased but since the patient's level of consciousnesses decreased, she received 300 cc NS bolus. EKG changes were noted and rapid response was activated, dialysis stopped, and blood returned to the patient. Around 6:30 AM the patient was noted to be apneic without a pulse and code blue was called. Patient was noted to have PEA arrest. CPR was done for a few minutes before ROSC was achieved. She was transferred to 87M and patient was intubated due to failure to protect airway.   Pertinent  Medical History  ESRD CHF CVA T2DM Seizures  Significant Hospital Events: Including procedures, antibiotic start and stop dates in addition to other pertinent events   1/23 admit to [facility] for acute infarct + covid/rsv pneumonia 1/30 admit to ICU after patient had PEA arrest   Interim History / Subjective:  As above  Objective   Blood pressure 123/73, pulse 100, temperature 98 F (36.7 C), temperature source Axillary, resp. rate 19, height 5\' 3"  (1.6 m), weight 76.8 kg, SpO2 100%.    FiO2 (%):  [28 %] 28 %   Intake/Output Summary (Last 24 hours) at 11/23/2023  4098 Last data filed at 11/23/2023 0700 Gross per 24 hour  Intake 900 ml  Output 1500 ml  Net -600 ml   Filed Weights   11/20/23 1500 11/20/23 1900  Weight: 79.5 kg 76.8 kg    Examination: General: Critically ill appearing female laying in bed, NAD HENT: ETT in place Lungs: CTAB, non labored on vent Cardiovascular: regular rate, rhythm Abdomen: soft, nondistended, nontender  Extremities: right BKA Neuro: sedated, not able to follow commands  ABG 7.399/41.3/512/25.5.  Resolved Hospital Problem list   N/a  Assessment & Plan:  PEA arrest Acute hypoxic respiratory failure Shock  ROSC achieved after a few minutes. Patient was unstable and transferred to ICU. Unclear etiology of PEA arrest but leading suspicion is hypovolemic shock vs septic shock.  - Continue levophed, MAP goal >65 - 1 L LR bolus - Blood cultures - Start empiric vanc, cefepime - consider HD line holiday as this is a possible source of infection - VAP bundle - PAD protocol  - pepcid 20 mg daily for stress ulcer prophylaxis   Acute metabolic encephalopathy Likely secondary to COVID and RSV, now confounded by cardiac arrest.  - Delirium precautions - Fentanyl gtt + bolus PRN for agitation  - Versed 1-2 mg q1h PRN to maintain RASS  Pneumonia due to COVID and RSV with superimposed bacterial infection - s/p course of ceftriaxone and azithro  - s/p course of solumedrol   Seizure disorder - Continue keppra  750 mg BID - Seizure precuations  Acute R PICA infarct  - Continue aspirin and brilinta for DAPT - Continue statin - Will need 30d cardiac monitor at discharge  ESRD on HD MWF - nephro consulted for HD   T2DM - CBGs + SSI  Best Practice (right click and "Reselect all SmartList Selections" daily)   Diet/type: NPO DVT prophylaxis prophylactic heparin  Pressure ulcer(s): pressure ulcer assessment deferred  GI prophylaxis: H2B Lines: Central line, Dialysis Catheter, Arterial Line, and yes and it  is still needed Foley:  N/A Code Status:  full code Last date of multidisciplinary goals of care discussion [updated husband 1/30]  Labs   CBC: Recent Labs  Lab 11/18/23 0205 11/19/23 0822 11/20/23 0733 11/21/23 0746 11/22/23 1026  WBC 9.0 8.3 9.6 8.7 7.6  NEUTROABS  --   --  6.6  --   --   HGB 10.5* 10.1* 10.7* 11.7* 13.1  HCT 30.9* 30.3* 32.0* 34.4* 37.6  MCV 87.3 87.1 86.7 86.0 85.8  PLT 294 321 338 364 367    Basic Metabolic Panel: Recent Labs  Lab 11/17/23 1200 11/18/23 0205 11/19/23 0822 11/20/23 0733 11/21/23 0746 11/22/23 1026  NA  --  136 138 135 134* 135  K  --  3.7 3.7 3.7 3.6 4.2  CL  --  98 99 97* 96* 94*  CO2  --  26 25 24 26 24   GLUCOSE  --  98 129* 93 93 83  BUN  --  11 29* 39* 19 31*  CREATININE  --  3.15* 5.45* 6.40* 4.18* 5.83*  CALCIUM  --  8.3* 8.1* 8.0* 8.5* 8.9  MG  --  1.9 2.0  --   --   --   PHOS 4.7*  --   --  3.7  --   --    GFR: Estimated Creatinine Clearance: 8.2 mL/min (A) (by C-G formula based on SCr of 5.83 mg/dL (H)). Recent Labs  Lab 11/17/23 0013 11/17/23 0349 11/17/23 0930 11/18/23 0205 11/19/23 0822 11/20/23 0733 11/21/23 0746 11/22/23 1026  PROCALCITON  --   --  0.86  --   --   --   --   --   WBC  --    < >  --    < > 8.3 9.6 8.7 7.6  LATICACIDVEN 0.9  --   --   --   --   --   --   --    < > = values in this interval not displayed.    Liver Function Tests: Recent Labs  Lab 11/16/23 2200 11/20/23 0733  AST 18  --   ALT 12  --   ALKPHOS 110  --   BILITOT 0.7  --   PROT 7.3  --   ALBUMIN 2.9* 2.9*   No results for input(s): "LIPASE", "AMYLASE" in the last 168 hours. Recent Labs  Lab 11/16/23 2318  AMMONIA 17    ABG    Component Value Date/Time   PHART 7.423 11/16/2023 2115   PCO2ART 51.1 (H) 11/16/2023 2115   PO2ART 144 (H) 11/16/2023 2115   HCO3 27.1 11/17/2023 0012   TCO2 28 11/17/2023 0012   ACIDBASEDEF 2.0 05/31/2022 1049   O2SAT 95 11/17/2023 0012     Coagulation Profile: Recent Labs   Lab 11/17/23 0349  INR 1.1    Cardiac Enzymes: No results for input(s): "CKTOTAL", "CKMB", "CKMBINDEX", "TROPONINI" in the last 168 hours.  HbA1C: Hemoglobin A1C  Date/Time Value Ref Range Status  12/22/2020 12:00 AM 8.6  Final  06/19/2020 12:00 AM 9.6  Final   Hgb A1c MFr Bld  Date/Time Value Ref Range Status  10/26/2023 11:30 AM 5.8 (H) 4.8 - 5.6 % Final    Comment:    (NOTE)         Prediabetes: 5.7 - 6.4         Diabetes: >6.4         Glycemic control for adults with diabetes: <7.0   02/10/2023 04:52 AM 6.7 (H) 4.8 - 5.6 % Final    Comment:    (NOTE) Pre diabetes:          5.7%-6.4%  Diabetes:              >6.4%  Glycemic control for   <7.0% adults with diabetes     CBG: Recent Labs  Lab 11/22/23 0618 11/22/23 1146 11/22/23 1603 11/22/23 2128 11/23/23 0625  GLUCAP 92 139* 183* 182* 98    Review of Systems:   Unable to obtain   Past Medical History:  She,  has a past medical history of Acute GI bleeding, Allergy, Anemia, Anterior chest wall pain, Appendicitis (1965), Asthma, Body mass index 37.0-37.9, adult, Breast pain, Cataract, CHF (congestive heart failure) (HCC), Cognitive change (04/20/2021), Complication of anesthesia, Dehydration (2014), Deviated septum (1971), Diabetes mellitus, Dysphagia due to old stroke, Dyspnea (2014), ESRD on hemodialysis (HCC), Extrinsic asthma, Fibroid (1980), GERD (gastroesophageal reflux disease), Heart murmur, History of migraine, History of seizure, gestational diabetes, Hyperlipidemia, Hypertension (2014), Inguinal hernia (1959), Malaise and fatigue (2014), Non-IgE mediated allergic asthma (2014), Obesity, Pelvic pain, Pregnancy, high-risk (1985), Stroke (HCC) (04/20/2021), Tonsillitis (1968), Uterine fibroid (1980), and Visual field defect.   Surgical History:   Past Surgical History:  Procedure Laterality Date   ABDOMINAL AORTOGRAM W/LOWER EXTREMITY N/A 02/21/2022   Procedure: ABDOMINAL AORTOGRAM W/LOWER EXTREMITY;   Surgeon: Maeola Harman, MD;  Location[facility] INVASIVE CV LAB;  Service: Cardiovascular;  Laterality: N/A;   AMPUTATION Right 05/18/2022   Procedure: RIGHT BELOW KNEE AMPUTATION;  Surgeon: Nadara Mustard, MD;  Location: [facility] OR;  Service: Orthopedics;  Laterality: Right;   AMPUTATION TOE Right 04/15/2022   Procedure: AMPUTATION  LST TOERIGHT FOOT;  Surgeon: Edwin Cap, DPM;  Location: [facility] ORS;  Service: Podiatry;  Laterality: Right;   APPENDECTOMY  1959   BASCILIC VEIN TRANSPOSITION Right 04/30/2021   Procedure: RIGHT FIRST STAGE BASCILIC VEIN TRANSPOSITION;  Surgeon: Chuck Hint, MD;  Location: [facility] OR;  Service: Vascular;  Laterality: Right;   CESAREAN SECTION  1985   COLONOSCOPY     ESOPHAGOGASTRODUODENOSCOPY (EGD) WITH PROPOFOL N/A 04/18/2021   Procedure: ESOPHAGOGASTRODUODENOSCOPY (EGD) WITH PROPOFOL;  Surgeon: Iva Boop, MD;  Location: [facility] ENDOSCOPY;  Service: Endoscopy;  Laterality: N/A;   EYE SURGERY     bilateral cataract    FLEXIBLE SIGMOIDOSCOPY N/A 04/18/2021   Procedure: FLEXIBLE SIGMOIDOSCOPY;  Surgeon: Iva Boop, MD;  Location: [facility] ENDOSCOPY;  Service: Endoscopy;  Laterality: N/A;   HERNIA REPAIR  1959   IR FLUORO GUIDE CV LINE RIGHT  03/18/2021   IR US GUIDE VASC ACCESS RIGHT  03/18/2021   LEFT HEART CATH AND CORONARY ANGIOGRAPHY N/A 04/04/2017   Procedure: Left Heart Cath and Coronary Angiography;  Surgeon: Lyn Records, MD;  Location: [facility] INVASIVE CV LAB;  Service: Cardiovascular;  Laterality: N/A;   MYOMECTOMY  1980, 2004, 2007   PERIPHERAL VASCULAR THROMBECTOMY  02/21/2022   Procedure: PERIPHERAL VASCULAR THROMBECTOMY;  Surgeon: Maeola Harman,  MD;  Location: [facility] INVASIVE CV LAB;  Service: Cardiovascular;;  Right Tibial   RHINOPLASTY  1971   ROTATOR CUFF REPAIR  2003   SURGICAL REPAIR OF HEMORRHAGE  2015   TONSILLECTOMY  1968     Social History:   reports that she has never smoked. She has never used smokeless tobacco. She reports that  she does not drink alcohol and does not use drugs.   Family History:  Her family history includes Alzheimer's disease in her father; Colon cancer in her cousin; Diabetes in her cousin and maternal aunt; Melanoma in her mother; Psoriasis in her mother. There is no history of Colon polyps, Esophageal cancer, Cancer - Colon, Liver disease, BRCA 1/2, or Breast cancer.   Allergies Allergies  Allergen Reactions   Almond Oil Anaphylaxis   Food Anaphylaxis    Peanuts - anaphylaxis     Gadolinium Derivatives Other (See Comments)    Gadolinium-Containing Contrast Media    Januvia [Sitagliptin] Other (See Comments)   Pork-Derived Products Other (See Comments)    Does not eat pork       Home Medications  Prior to Admission medications   Medication Sig Start Date End Date Taking? Authorizing Provider  acetaminophen (TYLENOL) 500 MG tablet Take 1,000 mg by mouth 2 (two) times daily as needed (leg and arm pain).   Yes [provider]  acetaminophen (TYLENOL) 650 MG CR tablet Take 650 mg by mouth 2 (two) times daily.   Yes [provider]  albuterol (PROVENTIL) (2.5 MG/3ML) 0.083% nebulizer solution Use 1 vial (2.5 mg total) by nebulization every 4 (four) hours as needed for wheezing or shortness of breath. 02/14/23 02/14/24 Yes Vann, Jessica U, DO  Amino Acids-Protein Hydrolys (PRO-STAT SUGAR FREE PO) Take 30 mLs by mouth 2 (two) times daily.   Yes [provider]  amitriptyline (ELAVIL) 25 MG tablet Take 0.5-1 tablets (12.5-25 mg total) by mouth at bedtime. Patient taking differently: Take 12.5 mg by mouth at bedtime. 09/11/23  Yes Lovorn, Megan, MD  aspirin 81 MG chewable tablet Chew 81 mg by mouth daily.   Yes [provider]  calcitRIOL (ROCALTROL) 0.5 MCG capsule Take 0.5 mcg by mouth See admin instructions. Only takes on Dialysis days Monday,Wednesday,Friday 07/10/23  Yes [provider]  cinacalcet (SENSIPAR) 30 MG tablet Take 30 mg by mouth daily.    Yes [provider]  cyclobenzaprine (FLEXERIL) 5 MG tablet Take 1 tablet (5 mg total) by mouth 3 (three) times daily as needed for muscle spasms. 11/01/23  Yes Hughie Closs, MD  diclofenac Sodium (VOLTAREN) 1 % GEL Apply 2 g topically 4 (four) times daily. To left wrist 05/26/21  Yes Love, Evlyn Kanner, PA-C  Glucagon, rDNA, (GLUCAGON EMERGENCY) 1 MG KIT Inject 1 mg into the vein as needed (CBG of 65mg /dL).   Yes [provider]  hydrALAZINE (APRESOLINE) 100 MG tablet Take 1 tablet (100 mg total) by mouth 3 (three) times daily. 11/01/23 12/01/23 Yes Pahwani, Daleen Bo, MD  insulin aspart (NOVOLOG) 100 UNIT/ML injection Inject 0-6 Units into the skin 3 (three) times daily with meals. CBG < 70: Implement Hypoglycemia Standing Orders and refer to Hypoglycemia Standing Orders sidebar report CBG 70 - 120: 0 units CBG 121 - 150: 0 units CBG 151 - 200: 1 unit CBG 201-250: 2 units CBG 251-300: 3 units CBG 301-350: 4 units CBG 351-400: 5 units CBG > 400: Give 6 units and call MD 06/29/22  Yes Pokhrel, Rebekah Chesterfield, MD  levETIRAcetam (KEPPRA) 250  MG tablet Take 1.5 tablets (375 mg total) by mouth every Monday, Wednesday, and Friday at 6 PM. 06/29/22  Yes Pokhrel, Laxman, MD  levETIRAcetam (KEPPRA) 750 MG tablet Take 1 tablet (750 mg total) by mouth at bedtime. 06/29/22  Yes Pokhrel, Laxman, MD  loratadine (CLARITIN) 10 MG tablet Take 10 mg by mouth daily.   Yes [provider]  losartan (COZAAR) 50 MG tablet Take 1 tablet (50 mg total) by mouth 2 (two) times daily. 11/01/23 12/01/23 Yes Pahwani, Daleen Bo, MD  melatonin 5 MG TABS Take 5 mg by mouth at bedtime.   Yes [provider]  mometasone-formoterol (DULERA) 200-5 MCG/ACT AERO Inhale 2 puffs into the lungs 2 (two) times daily. 04/15/22  Yes Dorothyann Peng, MD  pantoprazole (PROTONIX) 40 MG tablet Take 1 tablet (40 mg total) by mouth daily. Patient taking differently: Take 40 mg by mouth at bedtime. 02/07/22  Yes Dorothyann Peng, MD  prochlorperazine  (COMPAZINE) 10 MG tablet Take 1 tablet (10 mg total) by mouth every 8 (eight) hours as needed for nausea or vomiting. 02/15/23  Yes Danis, Starr Lake III, MD  rosuvastatin (CRESTOR) 20 MG tablet Take 1 tablet (20 mg total) by mouth daily. Patient taking differently: Take 20 mg by mouth at bedtime. 08/18/21  Yes Ghumman, Ramandeep, NP  sevelamer carbonate (RENVELA) 0.8 g PACK packet Take 0.8 g by mouth 3 (three) times daily. 10/19/21  Yes [provider]  ticagrelor (BRILINTA) 90 MG TABS tablet Take 1 tablet (90 mg total) by mouth 2 (two) times daily. 06/10/22  Yes Willeen Niece, MD  aspirin EC 81 MG tablet Take 1 tablet (81 mg total) by mouth daily. Swallow whole. Patient not taking: Reported on 11/17/2023 06/10/22   Willeen Niece, MD  gabapentin (NEURONTIN) 300 MG capsule Take 1 capsule (300 mg total) by mouth at bedtime. Patient not taking: Reported on 11/17/2023 09/11/23   Genice Rouge, MD  hydrocortisone (ANUSOL-HC) 2.5 % rectal cream Place rectally 4 (four) times daily. Patient not taking: Reported on 11/17/2023 02/14/23   Joseph Art, DO  Iron Sucrose (VENOFER IV) Inject 50 mg into the skin once a week. 08/16/23 10/18/24  [provider]  Methoxy PEG-Epoetin Beta (MIRCERA IJ) Inject 1 Dose into the skin every 14 (fourteen) days. 04/19/23 08/28/24  [provider]  methylphenidate (RITALIN) 5 MG tablet Take 1 tablet (5 mg total) by mouth 2 (two) times daily with breakfast and lunch. Patient not taking: Reported on 11/17/2023 09/11/23   Genice Rouge, MD     Critical care time: 40 min

## 2023-11-23 NOTE — Progress Notes (Signed)
IV Team responded to Code Blue, IV access present. No intervention needed from IV team.

## 2023-11-23 NOTE — Progress Notes (Signed)
RT attempted Aline x2 without success. CCM at bedside, RN aware, RT will monitor as needed.

## 2023-11-23 NOTE — Progress Notes (Signed)
Pharmacy Antibiotic Note  Carolyn Russo is a 73 y.o. female admitted on 11/16/2023 with sepsis. Patient was treated for community aquired pneumonia this admission, has completed a 5-day course of ceftriaxone and azithromycin on 11/21/23. Noted to also be positive for covid and RSV this admission. Patient went into cardiac arrest on 11/23/23 AM. Pharmacy has been consulted for vancomycin and cefepime dosing.  Plan: Vancomycin 1750 mg IV x1, then vancomycin 750 mg IV MWF after HD Cefepime 2 gm IV x1 today, then 2 gm every MWF after HD Monitor HD schedule and vancomycin levels as appropriate Monitor Bcx and clinical progression F/u LOT and narrow antibiotics as appropriate  Height: 5\' 3"  (160 cm) Weight:  (Not able to get bed weight scale is broken) IBW/kg (Calculated) : 52.4  Temp (24hrs), Avg:97.8 F (36.6 C), Min:97.6 F (36.4 C), Max:98 F (36.7 C)  Recent Labs  Lab 11/17/23 0013 11/17/23 0349 11/19/23 0822 11/20/23 0733 11/21/23 0746 11/22/23 1026 11/23/23 0952  WBC  --    < > 8.3 9.6 8.7 7.6 14.1*  CREATININE  --    < > 5.45* 6.40* 4.18* 5.83* 4.62*  LATICACIDVEN 0.9  --   --   --   --   --   --    < > = values in this interval not displayed.    Estimated Creatinine Clearance: 10.3 mL/min (A) (by C-G formula based on SCr of 4.62 mg/dL (H)).    Allergies  Allergen Reactions   Almond Oil Anaphylaxis   Food Anaphylaxis    Peanuts - anaphylaxis     Gadolinium Derivatives Other (See Comments)    Gadolinium-Containing Contrast Media    Januvia [Sitagliptin] Other (See Comments)   Pork-Derived Products Other (See Comments)    Does not eat pork      Antimicrobials this admission: Ceftriaxone 1/23 >> 1/28 Azithromycin 1/23 >> 1/28 Vancomycin 1/30 >> Cefepime 1/30 >>  Microbiology results: 1/28 Bcx: pending 1/23 BCx: ngtd 1/23 MRSA PCR: negative 1/23 RVP: RSV positive 1/23: COVID positive  Thank you for allowing pharmacy to be a part of this patient's  care.  Enos Fling, PharmD PGY-1 Acute Care Pharmacy Resident 11/23/2023 2:45 PM

## 2023-11-23 NOTE — Procedures (Signed)
Arterial Catheter Insertion Procedure Note  Carolyn Russo  [id number]  1951-05-13  Date:11/23/23  Time:8:18 AM    Provider Performing: Cristopher Peru    Procedure: Insertion of Arterial Line (65784) with US guidance (69629)   Indication(s) Blood pressure monitoring and/or need for frequent ABGs  Consent Unable to obtain consent due to emergent nature of procedure.  Anesthesia None   Time Out Verified patient identification, verified procedure, site/side was marked, verified correct patient position, special equipment/implants available, medications/allergies/relevant history reviewed, required imaging and test results available.   Sterile Technique Maximal sterile technique including full sterile barrier drape, hand hygiene, sterile gown, sterile gloves, mask, hair covering, sterile ultrasound probe cover (if used).   Procedure Description Area of catheter insertion was cleaned with chlorhexidine and draped in sterile fashion. With real-time ultrasound guidance an arterial catheter was placed into the right femoral artery.  Appropriate arterial tracings confirmed on monitor.     Complications/Tolerance None; patient tolerated the procedure well.   EBL Minimal   Specimen(s) None   Cristopher Peru, PA-C [facility] 11/23/23 8:19 AM  Please see [email or web address] for pager details.  From 7A-7P if no response, please call [phone] After hours, please call ELink [phone]

## 2023-11-23 NOTE — Progress Notes (Addendum)
   11/23/23 0700  Vitals  BP 123/73  MAP (mmHg) 88  BP Location Left Arm  BP Method Automatic  Patient Position (if appropriate) Lying  Pulse Rate 100  Pulse Rate Source Monitor  ECG Heart Rate (!) 169  Resp 19  Oxygen Therapy  SpO2 100 %  During Treatment Monitoring  Blood Flow Rate (mL/min) 0 mL/min  Arterial Pressure (mmHg) -0.8 mmHg  Venous Pressure (mmHg) -1.82 mmHg  TMP (mmHg) 40 mmHg  Ultrafiltration Rate (mL/min) 0 mL/min  Dialysate Flow Rate (mL/min) 0 ml/min  Dialysate Potassium Concentration 3  Dialysate Calcium Concentration 2.5  Duration of HD Treatment -hour(s) 2.26 hour(s)  Cumulative Fluid Removed (mL) per Treatment  1483.67  Intra-Hemodialysis Comments See progress note  Post Treatment  Dialyzer Clearance Heavily streaked  Liters Processed 47.5  Fluid Removed (mL) 1500 mL  Tolerated HD Treatment No (Comment)  Post-Hemodialysis Comments  (2hrs 16 minutes int the treatment. Pt suffered cardiac arrythmias and low blood pressure)  Hemodialysis Catheter Right Subclavian  No placement date or time found.   Orientation: Right  Access Location: Subclavian  Site Condition No complications  Blue Lumen Status Saline locked  Red Lumen Status Saline locked;Dead end cap in place;Blood return noted  Purple Lumen Status Saline locked  Dressing Type Transparent  Dressing Status Antimicrobial disc/dressing in place;Clean, Dry, Intact  Drainage Description None  Dressing Change Due 11/28/23  Post treatment catheter status Capped and Clamped   2 hours and 15 minutes into Dialysis Pt 's blood pressure decrease. Uf goal lowered.to from . Lower level of consciousness noted,m saline bolus x 3. EKG changes noted. Rapid response activated. Blood return to patient. CPR started. After several minutes approximately 3 minutes , pulse returned. Pt responded with caugh. Intubation commenced. Dr.Coldanato notified. Pt transported to ICU.

## 2023-11-23 NOTE — Progress Notes (Signed)
Nephrology Follow-Up Consult note   Assessment/Recommendations: Carolyn Russo is a/an 73 y.o. female with a past medical history significant for ESRD, admitted for AMS w/ AHRF, CAP, COVID, RSV.       Dialysis Orders: MWF [facility]  4h   B350    75.5kg   2/2 bath   R fem TDC  Heparin 2600 + 1400 midrun - last HD 1/22, getting to dry wt - 3-4 drops in dry wt in last 3 wks - venofer 100 qhd thru 1/31 - mircera 150 q2, last 1/17, due 1/31   Assessment/Plan: AHRF/ CAP -  +COVID and RSV. Some volume excess. Abx per primary. UF w/ HD. Now on ventilator after cardiac arrest AMS/CVA - CVA found mgmt per neurology. AMS multifactorial ESRD - on HD MWF, maintain schedule HTN - pt BP's were low on arrival, resume home meds if needed Volume - UF as tolerated with HD Anemia of esrd - Hfb at goal. ESA due on 1/31. Secondary hyperparathyroidism - Cont home binders H/o seizure d/o - on Keppra Cardiac arrest - mgmt per ccm GOC: chronically ill now w/ AMS, hypoxia and cardiac arrest. Discuss with husband and advance GOC as able   Recommendations conveyed to primary service.    [name] 11/23/2023 9:26 AM  ___________________________________________________________  CC: AMS  Interval History/Subjective: Patient had low Bps on HD this morning and then coded. ROSC achieved after about 3 min. Now in ICU intubated.   Medications:  Current Facility-Administered Medications  Medication Dose Route Frequency Provider Last Rate Last Admin    stroke: early stages of recovery book   Does not apply Once Nolberto Hanlon, MD       acetaminophen (TYLENOL) tablet 650 mg  650 mg Oral Q6H PRN Nolberto Hanlon, MD   650 mg at 11/22/23 1018   Or   acetaminophen (TYLENOL) suppository 650 mg  650 mg Rectal Q6H PRN Nolberto Hanlon, MD       aspirin chewable tablet 81 mg  81 mg Per Tube Daily Atway, Rayann N, DO       bisacodyl (DULCOLAX) suppository 10 mg  10 mg Rectal Daily PRN Gillis Santa,  MD       Chlorhexidine Gluconate Cloth 2 % PADS 6 each  6 each Topical Q0600 Gillis Santa, MD   6 each at 11/22/23 1000   chlorpheniramine-HYDROcodone (TUSSIONEX) 10-8 MG/5ML suspension 5 mL  5 mL Oral Q12H PRN Gillis Santa, MD   5 mL at 11/20/23 1352   docusate sodium (COLACE) capsule 100 mg  100 mg Oral BID PRN Cristopher Peru, PA-C       famotidine (PEPCID) tablet 20 mg  20 mg Per Tube Daily Cherlynn Polo, Lauren E, PA-C       fentaNYL (SUBLIMAZE) bolus via infusion 25-100 mcg  25-100 mcg Intravenous Q15 min PRN Hunsucker, Lesia Sago, MD   25 mcg at 11/23/23 0916   fentaNYL in NS (37mcg/ml) infusion-PREMIX  25-200 mcg/hr Intravenous Continuous Hunsucker, Lesia Sago, MD 2.5 mL/hr at 11/23/23 0855 25 mcg/hr at 11/23/23 0855   heparin injection 5,000 Units  5,000 Units Subcutaneous Q8H Gillis Santa, MD   5,000 Units at 11/23/23 6578   hydrALAZINE (APRESOLINE) injection 10 mg  10 mg Intravenous Q4H PRN Nolberto Hanlon, MD   10 mg at 11/19/23 1100   insulin aspart (novoLOG) injection 0-6 Units  0-6 Units Subcutaneous Q4H Autry, Lauren E, PA-C       labetalol (NORMODYNE) injection 20 mg  20 mg Intravenous Q2H PRN Nolberto Hanlon, MD   20 mg at 11/19/23 4540   levETIRAcetam (KEPPRA) 750 mg in sodium chloride 0.9 % 100 mL IVPB  750 mg Intravenous QHS Atway, Rayann N, DO       [START ON 11/24/2023] levETIRAcetam (KEPPRA) IVPB 500 mg/100 mL premix  500 mg Intravenous Once per day on Monday Wednesday Friday Atway, Rayann N, DO       lidocaine (LIDODERM) 5 % 1 patch  1 patch Transdermal Q24H Berton Mount I, MD   1 patch at 11/22/23 2100   midazolam (VERSED) injection 1-2 mg  1-2 mg Intravenous Q1H PRN Hunsucker, Lesia Sago, MD       norepinephrine (LEVOPHED) 4mg  in (0.016 mg/mL) premix infusion  0-40 mcg/min Intravenous Titrated Atway, Rayann N, DO 7.5 mL/hr at 11/23/23 0908 2 mcg/min at 11/23/23 0908   Oral care mouth rinse  15 mL Mouth Rinse Q2H Cristopher Peru, PA-C       Oral care mouth rinse  15  mL Mouth Rinse PRN Cristopher Peru, PA-C       polyethylene glycol (MIRALAX / GLYCOLAX) packet 17 g  17 g Oral Daily PRN Cristopher Peru, PA-C       sevelamer carbonate (RENVELA) tablet 800 mg  800 mg Per Tube TID WC Atway, Rayann N, DO       sodium chloride flush (NS) 0.9 % injection 3 mL  3 mL Intravenous Q12H Nolberto Hanlon, MD   3 mL at 11/22/23 2144   sorbitol, magnesium hydroxide, mineral oil, glycerin (SMOG) enema  960 mL Rectal Daily PRN Gillis Santa, MD       ticagrelor (BRILINTA) tablet 90 mg  90 mg Per Tube BID Atway, Rayann N, DO       witch hazel-glycerin (TUCKS) pad   Topical PRN Gillis Santa, MD          Review of Systems: Deferred today  Physical Exam: Vitals:   11/23/23 0700 11/23/23 0828  BP: 123/73   Pulse: 100   Resp: 19   Temp:  97.7 F (36.5 C)  SpO2: 100%    No intake/output data recorded.  Intake/Output Summary (Last 24 hours) at 11/23/2023 0926 Last data filed at 11/23/2023 0700 Gross per 24 hour  Intake 480 ml  Output 1500 ml  Net -1020 ml   Due to the COVID pandemic, in attempts to limit transmission and over-utilization of resources (e.g. PPE), the patient was seen virtually today by means of chart review, discussion with staff, and virtual discussion with patient as needed.    Test Results I personally reviewed new and old clinical labs and radiology tests Lab Results  Component Value Date   NA 135 11/22/2023   K 4.2 11/22/2023   CL 94 (L) 11/22/2023   CO2 24 11/22/2023   BUN 31 (H) 11/22/2023   CREATININE 5.83 (H) 11/22/2023   GLU 366 06/19/2020   CALCIUM 8.9 11/22/2023   ALBUMIN 2.9 (L) 11/20/2023   PHOS 3.7 11/20/2023    CBC Recent Labs  Lab 11/20/23 0733 11/21/23 0746 11/22/23 1026  WBC 9.6 8.7 7.6  NEUTROABS 6.6  --   --   HGB 10.7* 11.7* 13.1  HCT 32.0* 34.4* 37.6  MCV 86.7 86.0 85.8  PLT 338 364 367

## 2023-11-23 NOTE — TOC Progression Note (Signed)
Transition of Care [facility]) - Progression Note    Patient Details  Name: Carolyn Russo MRN: [id number] Date of Birth: [birth year]  [facility] of [facility] CM/SW Contact  Marliss Coots, LCSW Phone Number: 11/23/2023, 9:22 AM  Clinical Narrative:     9:22 AM Per hospitalist, due to recent ICU admission patient is no longer medically reayd to discharge to [facility] today.  Expected Discharge Plan: Skilled Nursing Facility    Expected Discharge Plan and Services In-house Referral: Clinical Social Work     Living arrangements for the past 2 months: Single Family Home, Skilled Nursing Facility                                       Social Determinants of Health (SDOH) Interventions SDOH Screenings   Food Insecurity: No Food Insecurity (11/17/2023)  Housing: Low Risk  (11/17/2023)  Transportation Needs: No Transportation Needs (11/17/2023)  Utilities: Not At Risk (11/17/2023)  Alcohol Screen: Low Risk  (01/09/2019)  Depression (PHQ2-9): Low Risk  (09/11/2023)  Financial Resource Strain: Low Risk  (09/02/2021)  Physical Activity: Inactive (09/02/2021)  Social Connections: Socially Integrated (11/17/2023)  Stress: Stress Concern Present (09/02/2021)  Tobacco Use: Low Risk  (11/16/2023)    Readmission Risk Interventions    02/14/2023    4:54 PM  Readmission Risk Prevention Plan  Transportation Screening Complete  PCP or Specialist Appt within 3-5 Days Complete  HRI or Home Care Consult Complete  Social Work Consult for Recovery Care Planning/Counseling Complete  Palliative Care Screening Complete  Medication Review [employer]) Complete

## 2023-11-23 NOTE — Significant Event (Addendum)
This RN respond to CODE BLUE alarm. Pt is not breathing, no pulse, CPR started. ACLS protocol followed. Refer to CODE BLUE SHEET.

## 2023-11-23 NOTE — Progress Notes (Signed)
   11/23/23 0716  Spiritual Encounters  Type of Visit Initial  Conversation partners present during encounter Nurse  Referral source Code page  Reason for visit Code  OnCall Visit Yes   Chaplain responded to Code [facility] page.  No family present; Pt not available as medical team provided care. No further support needed at this time.  Chaplain services remain available by Spiritual Consult or for emergent cases, paging [phone]  Chaplain Raelene Bott, MDiv [email or web address] [phone]

## 2023-11-23 NOTE — Progress Notes (Signed)
   11/23/23 0700  Hemodialysis Catheter Right Subclavian  No placement date or time found.   Orientation: Right  Access Location: Subclavian  Site Condition No complications  Blue Lumen Status Saline locked  Red Lumen Status Saline locked;Dead end cap in place;Blood return noted  Purple Lumen Status Saline locked  Dressing Type Transparent  Dressing Status Antimicrobial disc/dressing in place;Clean, Dry, Intact  Drainage Description None  Dressing Change Due 11/28/23  Post treatment catheter status Capped and Clamped  Neurological  Level of Consciousness Responds to Voice  Additional Neurological Comments  (Post Cpr)  Respiratory  Respiratory Pattern  (Assisted ventilation)  Cardiac  Cardiac Rhythm ST  Vascular  Edema Generalized  Additional Vascular Comments  (Post CPR, Tachy rhythm, femoral Pulse detected)  Psychosocial  Psychosocial (WDL)  (post CPR. Pt response to her name call aloud.)

## 2023-11-24 DIAGNOSIS — G9340 Encephalopathy, unspecified: Secondary | ICD-10-CM | POA: Diagnosis not present

## 2023-11-24 DIAGNOSIS — I959 Hypotension, unspecified: Secondary | ICD-10-CM

## 2023-11-24 DIAGNOSIS — I469 Cardiac arrest, cause unspecified: Secondary | ICD-10-CM | POA: Diagnosis not present

## 2023-11-24 LAB — COMPREHENSIVE METABOLIC PANEL
ALT: 69 U/L — ABNORMAL HIGH (ref 0–44)
AST: 40 U/L (ref 15–41)
Albumin: 3 g/dL — ABNORMAL LOW (ref 3.5–5.0)
Alkaline Phosphatase: 80 U/L (ref 38–126)
Anion gap: 18 — ABNORMAL HIGH (ref 5–15)
BUN: 28 mg/dL — ABNORMAL HIGH (ref 8–23)
CO2: 21 mmol/L — ABNORMAL LOW (ref 22–32)
Calcium: 8.8 mg/dL — ABNORMAL LOW (ref 8.9–10.3)
Chloride: 95 mmol/L — ABNORMAL LOW (ref 98–111)
Creatinine, Ser: 5.52 mg/dL — ABNORMAL HIGH (ref 0.44–1.00)
GFR, Estimated: 8 mL/min — ABNORMAL LOW (ref >60)
Glucose, Bld: 84 mg/dL (ref 70–99)
Potassium: 3.4 mmol/L — ABNORMAL LOW (ref 3.5–5.1)
Sodium: 134 mmol/L — ABNORMAL LOW (ref 135–145)
Total Bilirubin: 1.5 mg/dL — ABNORMAL HIGH (ref 0.0–1.2)
Total Protein: 6.5 g/dL (ref 6.5–8.1)

## 2023-11-24 LAB — GLUCOSE, CAPILLARY
Glucose-Capillary: 103 mg/dL — ABNORMAL HIGH (ref 70–99)
Glucose-Capillary: 122 mg/dL — ABNORMAL HIGH (ref 70–99)
Glucose-Capillary: 128 mg/dL — ABNORMAL HIGH (ref 70–99)
Glucose-Capillary: 146 mg/dL — ABNORMAL HIGH (ref 70–99)
Glucose-Capillary: 64 mg/dL — ABNORMAL LOW (ref 70–99)
Glucose-Capillary: 65 mg/dL — ABNORMAL LOW (ref 70–99)
Glucose-Capillary: 75 mg/dL (ref 70–99)
Glucose-Capillary: 83 mg/dL (ref 70–99)
Glucose-Capillary: 95 mg/dL (ref 70–99)

## 2023-11-24 LAB — CBC
HCT: 32.6 % — ABNORMAL LOW (ref 36.0–46.0)
Hemoglobin: 11.1 g/dL — ABNORMAL LOW (ref 12.0–15.0)
MCH: 29 pg (ref 26.0–34.0)
MCHC: 34 g/dL (ref 30.0–36.0)
MCV: 85.1 fL (ref 80.0–100.0)
Platelets: 257 10*3/uL (ref 150–400)
RBC: 3.83 MIL/uL — ABNORMAL LOW (ref 3.87–5.11)
RDW: 19 % — ABNORMAL HIGH (ref 11.5–15.5)
WBC: 11.4 10*3/uL — ABNORMAL HIGH (ref 4.0–10.5)
nRBC: 1.1 % — ABNORMAL HIGH (ref 0.0–0.2)

## 2023-11-24 LAB — HEPATITIS B SURFACE ANTIGEN: Hepatitis B Surface Ag: NONREACTIVE

## 2023-11-24 MED ORDER — METOPROLOL TARTRATE 5 MG/5ML IV SOLN
2.5000 mg | Freq: Once | INTRAVENOUS | Status: AC
Start: 2023-11-24 — End: 2023-11-24
  Administered 2023-11-24: 2.5 mg via INTRAVENOUS
  Filled 2023-11-24: qty 5

## 2023-11-24 MED ORDER — VANCOMYCIN HCL 750 MG IV SOLR
750.0000 mg | INTRAVENOUS | Status: DC
  Filled 2023-11-24: qty 15

## 2023-11-24 MED ORDER — RENA-VITE PO TABS
1.0000 | ORAL_TABLET | Freq: Every day | ORAL | Status: DC
  Administered 2023-11-24 – 2023-12-18 (×25): 1
  Filled 2023-11-24 (×25): qty 1

## 2023-11-24 MED ORDER — DEXTROSE 50 % IV SOLN
INTRAVENOUS | Status: AC
  Administered 2023-11-24: 50 mL
  Filled 2023-11-24: qty 50

## 2023-11-24 MED ORDER — VANCOMYCIN HCL 750 MG/150ML IV SOLN: 750.0000 mg | INTRAVENOUS | Status: DC | PRN

## 2023-11-24 MED ORDER — SODIUM CHLORIDE 0.9 % IV SOLN
1.0000 g | INTRAVENOUS | Status: DC
  Administered 2023-11-24 – 2023-11-27 (×4): 1 g via INTRAVENOUS
  Filled 2023-11-24 (×6): qty 10

## 2023-11-24 MED ORDER — VANCOMYCIN HCL 750 MG/150ML IV SOLN
750.0000 mg | Freq: Once | INTRAVENOUS | Status: DC
  Filled 2023-11-24: qty 150

## 2023-11-24 MED ORDER — VITAL 1.5 CAL PO LIQD
1000.0000 mL | ORAL | Status: DC
  Administered 2023-11-24 – 2023-12-16 (×13): 1000 mL
  Filled 2023-11-24 (×4): qty 1000

## 2023-11-24 MED ORDER — DEXMEDETOMIDINE HCL IN NACL 400 MCG/100ML IV SOLN
0.0000 ug/kg/h | INTRAVENOUS | Status: DC
  Administered 2023-11-24: 0.4 ug/kg/h via INTRAVENOUS
  Administered 2023-11-25 (×2): 0.5 ug/kg/h via INTRAVENOUS
  Administered 2023-11-25: 0.3 ug/kg/h via INTRAVENOUS
  Administered 2023-11-26: 0.198 ug/kg/h via INTRAVENOUS
  Administered 2023-11-26: 0.3 ug/kg/h via INTRAVENOUS
  Administered 2023-11-27: 0.4 ug/kg/h via INTRAVENOUS
  Administered 2023-11-27: 0.3 ug/kg/h via INTRAVENOUS
  Filled 2023-11-24 (×5): qty 100

## 2023-11-24 MED ORDER — VANCOMYCIN VARIABLE DOSE PER UNSTABLE RENAL FUNCTION (PHARMACIST DOSING): Status: DC

## 2023-11-24 NOTE — Consult Note (Signed)
NAME:  Carolyn Russo, MRN:  [id number], DOB:  1951/09/14, LOS: 8 ADMISSION DATE:  11/16/2023, CONSULTATION DATE:  1/30 REFERRING MD:  TRH, CHIEF COMPLAINT:  code blue   History of Present Illness:  Carolyn Russo is a 73 yo female with ESRD on HD MWF, CHF, previous CVA with residual left sided weakness, T2DM, right BKA, and seizure disorder. She presented to [name] on 1/23 with confusion, left sided chest pain, shortness of breath, and coughing. She was found to be hypoxic on room air and placed on 2 L [place]. CT chest was done without evidence of PE.  MRI revealed a 1.3cm acute to early subacute right PICA infarct. Also was admitted with pneumonia due to COVID and RSV and was started on antibiotics for likely superimposed bacterial infection.   On 1/30 early morning the patient was ~2 hrs into dialysis when her blood pressure dropped. UF goal was decreased but since the patient's level of consciousnesses decreased, she received 300 cc NS bolus. EKG changes were noted and rapid response was activated, dialysis stopped, and blood returned to the patient. Around 6:30 AM the patient was noted to be apneic without a pulse and code blue was called. Patient was noted to have PEA arrest. CPR was done for a few minutes before ROSC was achieved. She was transferred to 30M and patient was intubated due to failure to protect airway.   Pertinent  Medical History  ESRD CHF CVA T2DM Seizures  Significant Hospital Events: Including procedures, antibiotic start and stop dates in addition to other pertinent events   1/23 admit to [facility] for acute infarct + covid/rsv pneumonia 1/30 admit to ICU after patient had PEA arrest   Interim History / Subjective:  As above  Objective   Blood pressure 95/60, pulse 66, temperature 98.9 F (37.2 C), temperature source Axillary, resp. rate 10, height 5\' 3"  (1.6 m), weight 76.8 kg, SpO2 100%.    Vent Mode: PRVC;SIMV;PSV FiO2 (%):  [40 %] 40 % Set Rate:  [12 bmp-20 bmp] 12  bmp Vt Set:  [410 mL-420 mL] 420 mL PEEP:  [5 cmH20] 5 cmH20 Pressure Support:  [12 cmH20] 12 cmH20 Plateau Pressure:  [17 cmH20-19 cmH20] 19 cmH20   Intake/Output Summary (Last 24 hours) at 11/24/2023 1459 Last data filed at 11/24/2023 1400 Gross per 24 hour  Intake 1067.81 ml  Output 500 ml  Net 567.81 ml   Filed Weights   11/20/23 1500 11/20/23 1900  Weight: 79.5 kg 76.8 kg    Examination: General: Critically ill appearing female laying in bed, NAD HENT: ETT in place Lungs: CTAB, non labored on vent Cardiovascular: regular rate, rhythm Abdomen: soft, nondistended, nontender  Extremities: right BKA Neuro: sedated, not able to follow commands, seen moving extremities  Resolved Hospital Problem list   N/a  Assessment & Plan:   PEA arrest: Due to hypotension presumably given preceding story. --Status post fluid resuscitation -- Initial neurologic exam reassuring moving extremities, trying to sit up, reportedly follows some commands at times   Hypotension: Presumed hypovolemic given poor p.o. intake after stroke as well as fluid removal via dialysis.  Suspect mild hypotension ongoing related to sedation meds. -- Norepinephrine MAP goal greater than 65 -- Broad-spectrum antibiotics   Pneumonia due to COVID and RSV possible superimposed bacterial infection -- Complete course of antibiotics ceftriaxone and azithromycin for CAP -- Received course of Solu-Medrol this admission for COVID   Ventilator dependence after PEA arrest, encephalopathy: pO2 506 on high percent FiO2  on the vent, no true hypoxemia noted. -- PRVC, VAP bundle, stress ulcer prophylaxis -- Too sedated to participate in SBT, minimize sedation, will instruct overnight staff to do this given escalation of sedation overnight despite appearing quite comfortable during the day   History of seizure disorder: -- Consolidate home dose Keppra to 500 twice daily   Acute right PICA infarct -- Continue aspirin and  Brilinta, continue statin -- Needs 30-day heart monitor at discharge   ESRD on HD: Normal dialysis days Monday Wednesday Friday -- Appreciate nephrology assistance, no urgent need for dialysis currently, dialysis at their discretion   Diabetes: -- SSI  Best Practice (right click and "Reselect all SmartList Selections" daily)   Diet/type: NPO DVT prophylaxis prophylactic heparin  Pressure ulcer(s): pressure ulcer assessment deferred  GI prophylaxis: H2B Lines: Central line, Dialysis Catheter, Arterial Line, and yes and it is still needed Foley:  N/A Code Status:  full code Last date of multidisciplinary goals of care discussion [updated husband and son 1/31]  Labs   CBC: Recent Labs  Lab 11/20/23 0733 11/21/23 0746 11/22/23 1026 11/23/23 0858 11/23/23 0952 11/24/23 0434  WBC 9.6 8.7 7.6  --  14.1* 11.4*  NEUTROABS 6.6  --   --   --   --   --   HGB 10.7* 11.7* 13.1 14.3 12.6 11.1*  HCT 32.0* 34.4* 37.6 42.0 36.2 32.6*  MCV 86.7 86.0 85.8  --  84.6 85.1  PLT 338 364 367  --  311 257    Basic Metabolic Panel: Recent Labs  Lab 11/18/23 0205 11/19/23 0822 11/20/23 0733 11/21/23 0746 11/22/23 1026 11/23/23 0858 11/23/23 0952 11/24/23 0434  NA 136 138 135 134* 135 133* 133* 134*  K 3.7 3.7 3.7 3.6 4.2 3.5 3.4* 3.4*  CL 98 99 97* 96* 94*  --  93* 95*  CO2 26 25 24 26 24   --  22 21*  GLUCOSE 98 129* 93 93 83  --  222* 84  BUN 11 29* 39* 19 31*  --  21 28*  CREATININE 3.15* 5.45* 6.40* 4.18* 5.83*  --  4.62* 5.52*  CALCIUM 8.3* 8.1* 8.0* 8.5* 8.9  --  8.7* 8.8*  MG 1.9 2.0  --   --   --   --  1.8  --   PHOS  --   --  3.7  --   --   --   --   --    GFR: Estimated Creatinine Clearance: 8.6 mL/min (A) (by C-G formula based on SCr of 5.52 mg/dL (H)). Recent Labs  Lab 11/21/23 0746 11/22/23 1026 11/23/23 0952 11/24/23 0434  WBC 8.7 7.6 14.1* 11.4*    Liver Function Tests: Recent Labs  Lab 11/20/23 0733 11/24/23 0434  AST  --  40  ALT  --  69*  ALKPHOS  --   80  BILITOT  --  1.5*  PROT  --  6.5  ALBUMIN 2.9* 3.0*   No results for input(s): "LIPASE", "AMYLASE" in the last 168 hours. No results for input(s): "AMMONIA" in the last 168 hours.   ABG    Component Value Date/Time   PHART 7.406 11/23/2023 0858   PCO2ART 40.4 11/23/2023 0858   PO2ART 509 (H) 11/23/2023 0858   HCO3 25.5 11/23/2023 0858   TCO2 27 11/23/2023 0858   ACIDBASEDEF 2.0 05/31/2022 1049   O2SAT 100 11/23/2023 0858     Coagulation Profile: No results for input(s): "INR", "PROTIME" in the last 168 hours.  Cardiac Enzymes: No results for input(s): "CKTOTAL", "CKMB", "CKMBINDEX", "TROPONINI" in the last 168 hours.  HbA1C: Hemoglobin A1C  Date/Time Value Ref Range Status  12/22/2020 12:00 AM 8.6  Final  06/19/2020 12:00 AM 9.6  Final   Hgb A1c MFr Bld  Date/Time Value Ref Range Status  10/26/2023 11:30 AM 5.8 (H) 4.8 - 5.6 % Final    Comment:    (NOTE)         Prediabetes: 5.7 - 6.4         Diabetes: >6.4         Glycemic control for adults with diabetes: <7.0   02/10/2023 04:52 AM 6.7 (H) 4.8 - 5.6 % Final    Comment:    (NOTE) Pre diabetes:          5.7%-6.4%  Diabetes:              >6.4%  Glycemic control for   <7.0% adults with diabetes     CBG: Recent Labs  Lab 11/23/23 1957 11/24/23 0025 11/24/23 0333 11/24/23 0806 11/24/23 1150  GLUCAP 111* 75 95 103* 122*    Review of Systems:   Unable to obtain   Past Medical History:  She,  has a past medical history of Acute GI bleeding, Allergy, Anemia, Anterior chest wall pain, Appendicitis (1965), Asthma, Body mass index 37.0-37.9, adult, Breast pain, Cataract, CHF (congestive heart failure) (HCC), Cognitive change (04/20/2021), Complication of anesthesia, Dehydration (2014), Deviated septum (1971), Diabetes mellitus, Dysphagia due to old stroke, Dyspnea (2014), ESRD on hemodialysis (HCC), Extrinsic asthma, Fibroid (1980), GERD (gastroesophageal reflux disease), Heart murmur, History of  migraine, History of seizure, gestational diabetes, Hyperlipidemia, Hypertension (2014), Inguinal hernia (1959), Malaise and fatigue (2014), Non-IgE mediated allergic asthma (2014), Obesity, Pelvic pain, Pregnancy, high-risk (1985), Stroke (HCC) (04/20/2021), Tonsillitis (1968), Uterine fibroid (1980), and Visual field defect.   Surgical History:   Past Surgical History:  Procedure Laterality Date   ABDOMINAL AORTOGRAM W/LOWER EXTREMITY N/A 02/21/2022   Procedure: ABDOMINAL AORTOGRAM W/LOWER EXTREMITY;  Surgeon: Maeola Harman, MD;  Location: [facility] INVASIVE CV LAB;  Service: Cardiovascular;  Laterality: N/A;   AMPUTATION Right 05/18/2022   Procedure: RIGHT BELOW KNEE AMPUTATION;  Surgeon: Nadara Mustard, MD;  Location: [facility] OR;  Service: Orthopedics;  Laterality: Right;   AMPUTATION TOE Right 04/15/2022   Procedure: AMPUTATION  LST TOERIGHT FOOT;  Surgeon: Edwin Cap, DPM;  Location: [facility] ORS;  Service: Podiatry;  Laterality: Right;   APPENDECTOMY  1959   BASCILIC VEIN TRANSPOSITION Right 04/30/2021   Procedure: RIGHT FIRST STAGE BASCILIC VEIN TRANSPOSITION;  Surgeon: Chuck Hint, MD;  Location: [facility] OR;  Service: Vascular;  Laterality: Right;   CESAREAN SECTION  1985   COLONOSCOPY     ESOPHAGOGASTRODUODENOSCOPY (EGD) WITH PROPOFOL N/A 04/18/2021   Procedure: ESOPHAGOGASTRODUODENOSCOPY (EGD) WITH PROPOFOL;  Surgeon: Iva Boop, MD;  Location: [facility] ENDOSCOPY;  Service: Endoscopy;  Laterality: N/A;   EYE SURGERY     bilateral cataract    FLEXIBLE SIGMOIDOSCOPY N/A 04/18/2021   Procedure: FLEXIBLE SIGMOIDOSCOPY;  Surgeon: Iva Boop, MD;  Location: [facility] ENDOSCOPY;  Service: Endoscopy;  Laterality: N/A;   HERNIA REPAIR  1959   IR FLUORO GUIDE CV LINE RIGHT  03/18/2021   IR US GUIDE VASC ACCESS RIGHT  03/18/2021   LEFT HEART CATH AND CORONARY ANGIOGRAPHY N/A 04/04/2017   Procedure: Left Heart Cath and Coronary Angiography;  Surgeon: Lyn Records, MD;  Location: [facility] INVASIVE CV  LAB;  Service: Cardiovascular;  Laterality: N/A;   MYOMECTOMY  1980, 2004, 2007   PERIPHERAL VASCULAR THROMBECTOMY  02/21/2022   Procedure: PERIPHERAL VASCULAR THROMBECTOMY;  Surgeon: Maeola Harman, MD;  Location: [facility] INVASIVE CV LAB;  Service: Cardiovascular;;  Right Tibial   RHINOPLASTY  1971   ROTATOR CUFF REPAIR  2003   SURGICAL REPAIR OF HEMORRHAGE  2015   TONSILLECTOMY  1968     Social History:   reports that she has never smoked. She has never used smokeless tobacco. She reports that she does not drink alcohol and does not use drugs.   Family History:  Her family history includes Alzheimer's disease in her father; Colon cancer in her cousin; Diabetes in her cousin and maternal aunt; Melanoma in her mother; Psoriasis in her mother. There is no history of Colon polyps, Esophageal cancer, Cancer - Colon, Liver disease, BRCA 1/2, or Breast cancer.   Allergies Allergies  Allergen Reactions   Almond Oil Anaphylaxis   Food Anaphylaxis    Peanuts - anaphylaxis     Gadolinium Derivatives Other (See Comments)    Gadolinium-Containing Contrast Media    Januvia [Sitagliptin] Other (See Comments)   Pork-Derived Products Other (See Comments)    Does not eat pork       Home Medications  Prior to Admission medications   Medication Sig Start Date End Date Taking? Authorizing Provider  acetaminophen (TYLENOL) 500 MG tablet Take 1,000 mg by mouth 2 (two) times daily as needed (leg and arm pain).   Yes [provider]  acetaminophen (TYLENOL) 650 MG CR tablet Take 650 mg by mouth 2 (two) times daily.   Yes [provider]  albuterol (PROVENTIL) (2.5 MG/3ML) 0.083% nebulizer solution Use 1 vial (2.5 mg total) by nebulization every 4 (four) hours as needed for wheezing or shortness of breath. 02/14/23 02/14/24 Yes Vann, Jessica U, DO  Amino Acids-Protein Hydrolys (PRO-STAT SUGAR FREE PO) Take 30 mLs by mouth 2 (two) times daily.   Yes [provider]   amitriptyline (ELAVIL) 25 MG tablet Take 0.5-1 tablets (12.5-25 mg total) by mouth at bedtime. Patient taking differently: Take 12.5 mg by mouth at bedtime. 09/11/23  Yes Lovorn, Megan, MD  aspirin 81 MG chewable tablet Chew 81 mg by mouth daily.   Yes [provider]  calcitRIOL (ROCALTROL) 0.5 MCG capsule Take 0.5 mcg by mouth See admin instructions. Only takes on Dialysis days Monday,Wednesday,Friday 07/10/23  Yes [provider]  cinacalcet (SENSIPAR) 30 MG tablet Take 30 mg by mouth daily.   Yes [provider]  cyclobenzaprine (FLEXERIL) 5 MG tablet Take 1 tablet (5 mg total) by mouth 3 (three) times daily as needed for muscle spasms. 11/01/23  Yes Hughie Closs, MD  diclofenac Sodium (VOLTAREN) 1 % GEL Apply 2 g topically 4 (four) times daily. To left wrist 05/26/21  Yes Love, Evlyn Kanner, PA-C  Glucagon, rDNA, (GLUCAGON EMERGENCY) 1 MG KIT Inject 1 mg into the vein as needed (CBG of 65mg /dL).   Yes [provider]  hydrALAZINE (APRESOLINE) 100 MG tablet Take 1 tablet (100 mg total) by mouth 3 (three) times daily. 11/01/23 12/01/23 Yes Pahwani, Daleen Bo, MD  insulin aspart (NOVOLOG) 100 UNIT/ML injection Inject 0-6 Units into the skin 3 (three) times daily with meals. CBG < 70: Implement Hypoglycemia Standing Orders and refer to Hypoglycemia Standing Orders sidebar report CBG 70 - 120: 0 units CBG 121 - 150: 0 units CBG 151 - 200: 1 unit CBG 201-250: 2 units CBG 251-300: 3 units CBG  301-350: 4 units CBG 351-400: 5 units CBG > 400: Give 6 units and call MD 06/29/22  Yes Pokhrel, Rebekah Chesterfield, MD  levETIRAcetam (KEPPRA) 250 MG tablet Take 1.5 tablets (375 mg total) by mouth every Monday, Wednesday, and Friday at 6 PM. 06/29/22  Yes Pokhrel, Laxman, MD  levETIRAcetam (KEPPRA) 750 MG tablet Take 1 tablet (750 mg total) by mouth at bedtime. 06/29/22  Yes Pokhrel, Laxman, MD  loratadine (CLARITIN) 10 MG tablet Take 10 mg by mouth daily.   Yes [provider]  losartan  (COZAAR) 50 MG tablet Take 1 tablet (50 mg total) by mouth 2 (two) times daily. 11/01/23 12/01/23 Yes Pahwani, Daleen Bo, MD  melatonin 5 MG TABS Take 5 mg by mouth at bedtime.   Yes [provider]  mometasone-formoterol (DULERA) 200-5 MCG/ACT AERO Inhale 2 puffs into the lungs 2 (two) times daily. 04/15/22  Yes Dorothyann Peng, MD  pantoprazole (PROTONIX) 40 MG tablet Take 1 tablet (40 mg total) by mouth daily. Patient taking differently: Take 40 mg by mouth at bedtime. 02/07/22  Yes Dorothyann Peng, MD  prochlorperazine (COMPAZINE) 10 MG tablet Take 1 tablet (10 mg total) by mouth every 8 (eight) hours as needed for nausea or vomiting. 02/15/23  Yes Danis, Starr Lake III, MD  rosuvastatin (CRESTOR) 20 MG tablet Take 1 tablet (20 mg total) by mouth daily. Patient taking differently: Take 20 mg by mouth at bedtime. 08/18/21  Yes Ghumman, Ramandeep, NP  sevelamer carbonate (RENVELA) 0.8 g PACK packet Take 0.8 g by mouth 3 (three) times daily. 10/19/21  Yes [provider]  ticagrelor (BRILINTA) 90 MG TABS tablet Take 1 tablet (90 mg total) by mouth 2 (two) times daily. 06/10/22  Yes Willeen Niece, MD  aspirin EC 81 MG tablet Take 1 tablet (81 mg total) by mouth daily. Swallow whole. Patient not taking: Reported on 11/17/2023 06/10/22   Willeen Niece, MD  gabapentin (NEURONTIN) 300 MG capsule Take 1 capsule (300 mg total) by mouth at bedtime. Patient not taking: Reported on 11/17/2023 09/11/23   Genice Rouge, MD  hydrocortisone (ANUSOL-HC) 2.5 % rectal cream Place rectally 4 (four) times daily. Patient not taking: Reported on 11/17/2023 02/14/23   Joseph Art, DO  Iron Sucrose (VENOFER IV) Inject 50 mg into the skin once a week. 08/16/23 10/18/24  [provider]  Methoxy PEG-Epoetin Beta (MIRCERA IJ) Inject 1 Dose into the skin every 14 (fourteen) days. 04/19/23 08/28/24  [provider]  methylphenidate (RITALIN) 5 MG tablet Take 1 tablet (5 mg total) by mouth 2 (two) times  daily with breakfast and lunch. Patient not taking: Reported on 11/17/2023 09/11/23   Genice Rouge, MD     Critical care time:      CRITICAL CARE Performed by: Karren Burly   Total critical care time: 33 minutes  Critical care time was exclusive of separately billable procedures and treating other patients.  Critical care was necessary to treat or prevent imminent or life-threatening deterioration.  Critical care was time spent personally by me on the following activities: development of treatment plan with patient and/or surrogate as well as nursing, discussions with consultants, evaluation of patient's response to treatment, examination of patient, obtaining history from patient or surrogate, ordering and performing treatments and interventions, ordering and review of laboratory studies, ordering and review of radiographic studies, pulse oximetry and re-evaluation of patient's condition.  Karren Burly, MD

## 2023-11-24 NOTE — Progress Notes (Signed)
Pharmacy Antibiotic Note  Carolyn Russo is a 73 y.o. female admitted on 11/16/2023 with sepsis. Patient was treated for community aquired pneumonia this admission, has completed a 5-day course of ceftriaxone and azithromycin on 11/21/23. Noted to also be positive for covid and RSV this admission. Patient went into cardiac arrest on 11/23/23 AM. Pharmacy has been consulted for vancomycin and cefepime dosing.  Per nephology, no HD today but will likely plan on dialysis for Saturday 2/1.   Plan: Cancel MWF dosing of vancomycin and cefepime  Vancomycin 750 mg x1 ordered for tomorrow after HD  Cefepime 1 gram daily  Monitor HD schedule and vancomycin levels as appropriate Monitor Bcx and clinical progression F/u LOT and narrow antibiotics as appropriate  Height: 5\' 3"  (160 cm) Weight:  (Not able to get bed weight scale is broken) IBW/kg (Calculated) : 52.4  Temp (24hrs), Avg:98 F (36.7 C), Min:97.8 F (36.6 C), Max:98.4 F (36.9 C)  Recent Labs  Lab 11/20/23 0733 11/21/23 0746 11/22/23 1026 11/23/23 0952 11/24/23 0434  WBC 9.6 8.7 7.6 14.1* 11.4*  CREATININE 6.40* 4.18* 5.83* 4.62* 5.52*    Estimated Creatinine Clearance: 8.6 mL/min (A) (by C-G formula based on SCr of 5.52 mg/dL (H)).    Allergies  Allergen Reactions   Almond Oil Anaphylaxis   Food Anaphylaxis    Peanuts - anaphylaxis     Gadolinium Derivatives Other (See Comments)    Gadolinium-Containing Contrast Media    Januvia [Sitagliptin] Other (See Comments)   Pork-Derived Products Other (See Comments)    Does not eat pork      Antimicrobials this admission: Ceftriaxone 1/23 >> 1/28 Azithromycin 1/23 >> 1/28 Vancomycin 1/30 >> Cefepime 1/30 >>  Microbiology results: 1/28 Bcx: pending 1/23 BCx: ngtd 1/23 MRSA PCR: negative 1/23 RVP: RSV positive 1/23: COVID positive  Thank you for allowing pharmacy to be a part of this patient's care.  Cedric Fishman, PharmD, BCPS, BCCCP Clinical Pharmacist

## 2023-11-24 NOTE — Progress Notes (Signed)
Initial Nutrition Assessment  DOCUMENTATION CODES:   Not applicable  INTERVENTION:  If pt remains intubated, recommend initiating TF via OGT: Start Vital 1.5 at 88ml/h advancing by 10ml q8h to goal rate of  23ml/hr ( per day) Provides 1620 kcal, 73g protein, and per day  Suspect pt is at elevated risk for refeeding syndrome, monitor magnesium and phosphorus every 12 hours x 4 occurrences, MD to replete as needed.  Once extubated and diet advanced, recommend: Continuing dysphagia 2 diet order per SLP Ensure Enlive po BID, each supplement provides 350 kcal and 20 grams of protein. Renal MVI with minerals daily  NUTRITION DIAGNOSIS:   Inadequate oral intake related to acute illness as evidenced by NPO status.  GOAL:   Patient will meet greater than or equal to 90% of their needs  MONITOR:   Vent status, Labs, Weight trends, TF tolerance, I & O's  REASON FOR ASSESSMENT:   Ventilator    ASSESSMENT:   Pt  presented 1/23 with confusion and worsening L chest pain secondary to COVID and RSV infection. PMH significant for ESRD on HD, CHF, CVA with residual L sided weakness, T2DM, R BKA, seizure disorder.  1/23: MRI brain- revealed 1.3 cm acute to early subacute R PICA distribution infarct 1/28: MBS recommend dysphagia 2, thin liquids 1/30: PEA arrest; intubated; NPO   Assessed pt at bedside. No family present and pt unable to provide nutrition related history d/t intubation. Plans for possible extubation this afternoon. If not able to extubate, CCM agreeable to initiation of TF.   Limited meal completions on file to review throughout admission to assess for nutritional adequacy however there are 2 meals documented to be 25% breakfast and 10% lunch on 1/29.  Of note, pt with peanut and pork allergy.   Overall, on physical exam, pt does not look significantly malnourished though she is at elevated nutrition risk given co-morbidities and extended admission.   Patient  is currently intubated on ventilator support MV: 8.8 L/min Temp (24hrs), Avg:98.3 F (36.8 C), Min:97.9 F (36.6 C), Max:98.9 F (37.2 C) MAP (a-line) 55  Admit weight: 79.5 kg  Current weight: 76.8 kg EDW: 75.5 kg   Intake/Output Summary (Last 24 hours) at 11/24/2023 1517 Last data filed at 11/24/2023 1400 Gross per 24 hour  Intake 966.37 ml  Output 500 ml  Net 466.37 ml   Net IO Since Admission: -4,415.99 mL [11/24/23 1517]  Drains/Lines: OGT (distal stomach) RUE fistula  Nutritionally Relevant Medications: Scheduled Meds:  famotidine  20 mg Per Tube Daily   insulin aspart  0-15 Units Subcutaneous Q4H   sevelamer carbonate  800 mg Per Tube TID WC   sodium chloride flush  3 mL Intravenous Q12H   Continuous Infusions:  ceFEPime (MAXIPIME) IV     feeding supplement (VITAL 1.5 CAL)     fentaNYL infusion INTRAVENOUS Stopped (11/24/23 1108)   levETIRAcetam 500 mg (11/24/23 1139)   norepinephrine (LEVOPHED) Adult infusion 1 mcg/min (11/24/23 1254)   [START ON 11/25/2023] vancomycin     Labs Reviewed: Sodium 134 Potassium 3.4 -repletion ordered BUN 28 Cr 5.52 Anion gap 18 ALT 69 GFR 8 CBG ranges from 75-122 mg/dL over the last 24 hours HgbA1c 5.8%  NUTRITION - FOCUSED PHYSICAL EXAM:  Flowsheet Row Most Recent Value  Orbital Region No depletion  Upper Arm Region No depletion  Thoracic and Lumbar Region Unable to assess  Buccal Region Unable to assess  Temple Region No depletion  Clavicle Bone Region Mild depletion  Clavicle  and Acromion Bone Region No depletion  Scapular Bone Region Mild depletion  Dorsal Hand Unable to assess  Patellar Region Moderate depletion  Anterior Thigh Region Moderate depletion  [R BKA]  Posterior Calf Region Moderate depletion  Edema (RD Assessment) None  Hair Reviewed  Eyes Reviewed  Mouth Unable to assess  Skin Reviewed  Nails Unable to assess       Diet Order:   Diet Order             Diet NPO time specified  Diet  effective now                   EDUCATION NEEDS:   No education needs have been identified at this time  Skin:  Skin Assessment: Reviewed RN Assessment  Last BM:  1/31 type 6 medium  Height:   Ht Readings from Last 1 Encounters:  11/17/23 5\' 3"  (1.6 m)    Weight:   Wt Readings from Last 1 Encounters:  11/20/23 76.8 kg   BMI:  Body mass index is 29.99 kg/m.  Estimated Nutritional Needs:   Kcal:  1500-1700  Protein:  75-90g  Fluid:  1L + UOP  Carolyn Russo, RDN, LDN Clinical Nutrition

## 2023-11-24 NOTE — Progress Notes (Signed)
Nephrology Follow-Up Consult note   Assessment/Recommendations: Carolyn Russo is a/an 73 y.o. female with a past medical history significant for ESRD, admitted for AMS w/ AHRF, CAP, COVID, RSV.       Dialysis Orders: MWF NW  4h   B350    75.5kg   2/2 bath   R fem TDC  Heparin 2600 + 1400 midrun - last HD 1/22, getting to dry wt - 3-4 drops in dry wt in last 3 wks - venofer 100 qhd thru 1/31 - mircera 150 q2, last 1/17, due 1/31   Assessment/Plan: AHRF/ CAP -  +COVID and RSV. Some volume excess. Abx per primary. UF w/ HD. Now on ventilator after cardiac arrest.  Hoping for extubation today AMS/CVA - CVA found mgmt per neurology. AMS multifactorial ESRD - on HD MWF, hold on dialysis today and likely plan on dialysis tomorrow HTN - pt BP's were low on arrival, resume home meds if needed Volume -seems to be euvolemic.  Ultrafiltration as needed on dialysis Anemia of esrd - Hfb at goal. ESA due on 1/31. Secondary hyperparathyroidism - Cont home binders H/o seizure d/o - on Keppra PEA arrest- mgmt per ccm.  More stable at this time GOC: chronically ill now w/ AMS, hypoxia and cardiac arrest.  Family wishes to continue with course of care for now   Recommendations conveyed to primary service.    [name] 11/24/2023 9:48 AM  ___________________________________________________________  CC: AMS  Interval History/Subjective: Patient is stable in the ICU.  Labs reassuring this morning.  Minimal vent settings.  Hoping for extubation today   Medications:  Current Facility-Administered Medications  Medication Dose Route Frequency Provider Last Rate Last Admin    stroke: early stages of recovery book   Does not apply Once Nolberto Hanlon, MD       acetaminophen (TYLENOL) tablet 650 mg  650 mg Oral Q6H PRN Nolberto Hanlon, MD   650 mg at 11/22/23 1018   Or   acetaminophen (TYLENOL) suppository 650 mg  650 mg Rectal Q6H PRN Nolberto Hanlon, MD       aspirin  chewable tablet 81 mg  81 mg Per Tube Daily Atway, Rayann N, DO   81 mg at 11/23/23 1523   bisacodyl (DULCOLAX) suppository 10 mg  10 mg Rectal Daily PRN Gillis Santa, MD       ceFEPIme (MAXIPIME) 2 g in sodium chloride 0.9 % 100 mL IVPB  2 g Intravenous Q M,W,F-2000 Katsaros, Marcia Brash, RPH       Chlorhexidine Gluconate Cloth 2 % PADS 6 each  6 each Topical Q0600 Gillis Santa, MD   6 each at 11/23/23 1700   chlorpheniramine-HYDROcodone (TUSSIONEX) 10-8 MG/5ML suspension 5 mL  5 mL Oral Q12H PRN Gillis Santa, MD   5 mL at 11/20/23 1352   docusate sodium (COLACE) capsule 100 mg  100 mg Oral BID PRN Cristopher Peru, PA-C       famotidine (PEPCID) tablet 20 mg  20 mg Per Tube Daily Cherlynn Polo, Lauren E, PA-C   20 mg at 11/23/23 1523   fentaNYL (SUBLIMAZE) bolus via infusion 25-100 mcg  25-100 mcg Intravenous Q15 min PRN Hunsucker, Lesia Sago, MD   100 mcg at 11/24/23 0431   fentaNYL in NS (71mcg/ml) infusion-PREMIX  25-200 mcg/hr Intravenous Continuous Hunsucker, Lesia Sago, MD 7 mL/hr at 11/24/23 0841 70 mcg/hr at 11/24/23 0841   heparin injection 5,000 Units  5,000 Units Subcutaneous Q8H Gillis Santa, MD  5,000 Units at 11/24/23 9604   hydrALAZINE (APRESOLINE) injection 10 mg  10 mg Intravenous Q4H PRN Nolberto Hanlon, MD   10 mg at 11/19/23 1100   insulin aspart (novoLOG) injection 0-15 Units  0-15 Units Subcutaneous Q4H Atway, Rayann N, DO       labetalol (NORMODYNE) injection 20 mg  20 mg Intravenous Q2H PRN Nolberto Hanlon, MD   20 mg at 11/19/23 5409   levETIRAcetam (KEPPRA) IVPB 500 mg/100 mL premix  500 mg Intravenous BID Hunsucker, Lesia Sago, MD   Stopped at 11/23/23 2329   lidocaine (LIDODERM) 5 % 1 patch  1 patch Transdermal Q24H Berton Mount I, MD   1 patch at 11/23/23 2149   midazolam (VERSED) injection 1-2 mg  1-2 mg Intravenous Q1H PRN Hunsucker, Lesia Sago, MD   2 mg at 11/24/23 0146   norepinephrine (LEVOPHED) 4mg  in (0.016 mg/mL) premix infusion  0-40 mcg/min Intravenous  Titrated Atway, Rayann N, DO 11.25 mL/hr at 11/24/23 0700 3 mcg/min at 11/24/23 0700   ondansetron (ZOFRAN) injection 4 mg  4 mg Intravenous Q6H PRN Hunsucker, Lesia Sago, MD       Oral care mouth rinse  15 mL Mouth Rinse Q2H Cherlynn Polo, Lauren E, PA-C   15 mL at 11/24/23 8119   Oral care mouth rinse  15 mL Mouth Rinse PRN Cristopher Peru, PA-C       polyethylene glycol (MIRALAX / GLYCOLAX) packet 17 g  17 g Oral Daily PRN Cristopher Peru, PA-C       sevelamer carbonate (RENVELA) tablet 800 mg  800 mg Per Tube TID WC Atway, Rayann N, DO   800 mg at 11/24/23 0844   sodium chloride flush (NS) 0.9 % injection 3 mL  3 mL Intravenous Q12H Nolberto Hanlon, MD   3 mL at 11/22/23 2144   sorbitol, magnesium hydroxide, mineral oil, glycerin (SMOG) enema  960 mL Rectal Daily PRN Gillis Santa, MD       ticagrelor [facility]) tablet 90 mg  90 mg Per Tube BID Atway, Rayann N, DO   90 mg at 11/23/23 2149   vancomycin (VANCOCIN) 750 mg in sodium chloride 0.9 % 250 mL IVPB  750 mg Intravenous Q M,W,F-HD Hunsucker, Lesia Sago, MD       witch hazel-glycerin (TUCKS) pad   Topical PRN Gillis Santa, MD          Review of Systems: Unable to obtain due to the patient's sedation  Physical Exam: Vitals:   11/24/23 0700 11/24/23 0810  BP:    Pulse: 70   Resp: 20   Temp:  98.4 F (36.9 C)  SpO2: 100%    No intake/output data recorded.  Intake/Output Summary (Last 24 hours) at 11/24/2023 0948 Last data filed at 11/24/2023 0700 Gross per 24 hour  Intake 958.19 ml  Output 500 ml  Net 458.19 ml   GEN: Chronically ill-appearing, lying in bed, no distress ENT: no nasal discharge, mmm ET tube in place EYES: no scleral icterus, eomi CV: normal rate, no audible rub PULM: Bilateral chest rise, ventilated ABD: NABS, non-distended SKIN: no rashes or jaundice EXT: no edema, warm and well perfused    Test Results I personally reviewed new and old clinical labs and radiology tests Lab Results  Component Value Date   NA  134 (L) 11/24/2023   K 3.4 (L) 11/24/2023   CL 95 (L) 11/24/2023   CO2 21 (L) 11/24/2023   BUN 28 (H) 11/24/2023   CREATININE 5.52 (  H) 11/24/2023   GLU 366 06/19/2020   CALCIUM 8.8 (L) 11/24/2023   ALBUMIN 3.0 (L) 11/24/2023   PHOS 3.7 11/20/2023    CBC Recent Labs  Lab 11/20/23 0733 11/21/23 0746 11/22/23 1026 11/23/23 0858 11/23/23 0952 11/24/23 0434  WBC 9.6   < > 7.6  --  14.1* 11.4*  NEUTROABS 6.6  --   --   --   --   --   HGB 10.7*   < > 13.1 14.3 12.6 11.1*  HCT 32.0*   < > 37.6 42.0 36.2 32.6*  MCV 86.7   < > 85.8  --  84.6 85.1  PLT 338   < > 367  --  311 257   < > = values in this interval not displayed.

## 2023-11-24 NOTE — TOC Progression Note (Signed)
Transition of Care [facility]) - Progression Note    Patient Details  Name: Carolyn Russo MRN: [id number] Date of Birth: Aug 12, 1951  Transition of [facility]) CM/SW Contact  Marliss Coots, LCSW Phone Number: 11/24/2023, 11:41 AM  Clinical Narrative:     11:41 AM Per progressions, patient is expected to be extubated today.  Expected Discharge Plan: Skilled Nursing Facility    Expected Discharge Plan and Services In-house Referral: Clinical Social Work     Living arrangements for the past 2 months: Single Family Home, Skilled Nursing Facility                                       Social Determinants of Health (SDOH) Interventions SDOH Screenings   Food Insecurity: No Food Insecurity (11/17/2023)  Housing: Low Risk  (11/17/2023)  Transportation Needs: No Transportation Needs (11/17/2023)  Utilities: Not At Risk (11/17/2023)  Alcohol Screen: Low Risk  (01/09/2019)  Depression (PHQ2-9): Low Risk  (09/11/2023)  Financial Resource Strain: Low Risk  (09/02/2021)  Physical Activity: Inactive (09/02/2021)  Social Connections: Socially Integrated (11/17/2023)  Stress: Stress Concern Present (09/02/2021)  Tobacco Use: Low Risk  (11/16/2023)    Readmission Risk Interventions    02/14/2023    4:54 PM  Readmission Risk Prevention Plan  Transportation Screening Complete  PCP or Specialist Appt within 3-5 Days Complete  HRI or Home Care Consult Complete  Social Work Consult for Recovery Care Planning/Counseling Complete  Palliative Care Screening Complete  Medication Review [employer]) Complete

## 2023-11-25 DIAGNOSIS — I959 Hypotension, unspecified: Secondary | ICD-10-CM | POA: Diagnosis not present

## 2023-11-25 DIAGNOSIS — G9340 Encephalopathy, unspecified: Secondary | ICD-10-CM | POA: Diagnosis not present

## 2023-11-25 DIAGNOSIS — I469 Cardiac arrest, cause unspecified: Secondary | ICD-10-CM | POA: Diagnosis not present

## 2023-11-25 LAB — GLUCOSE, CAPILLARY
Glucose-Capillary: 121 mg/dL — ABNORMAL HIGH (ref 70–99)
Glucose-Capillary: 149 mg/dL — ABNORMAL HIGH (ref 70–99)
Glucose-Capillary: 86 mg/dL (ref 70–99)
Glucose-Capillary: 78 mg/dL (ref 70–99)
Glucose-Capillary: 168 mg/dL — ABNORMAL HIGH (ref 70–99)

## 2023-11-25 LAB — POCT I-STAT 7, (LYTES, BLD GAS, ICA,H+H)
Acid-Base Excess: 2 mmol/L (ref 0.0–2.0)
Bicarbonate: 24 mmol/L (ref 20.0–28.0)
Calcium, Ion: 1.08 mmol/L — ABNORMAL LOW (ref 1.15–1.40)
HCT: 34 % — ABNORMAL LOW (ref 36.0–46.0)
Hemoglobin: 11.6 g/dL — ABNORMAL LOW (ref 12.0–15.0)
O2 Saturation: 100 %
Patient temperature: 98.8
Potassium: 3.1 mmol/L — ABNORMAL LOW (ref 3.5–5.1)
Sodium: 134 mmol/L — ABNORMAL LOW (ref 135–145)
TCO2: 25 mmol/L (ref 22–32)
pCO2 arterial: 29.8 mm[Hg] — ABNORMAL LOW (ref 32–48)
pH, Arterial: 7.515 — ABNORMAL HIGH (ref 7.35–7.45)
pO2, Arterial: 147 mm[Hg] — ABNORMAL HIGH (ref 83–108)

## 2023-11-25 LAB — CBC
HCT: 29.6 % — ABNORMAL LOW (ref 36.0–46.0)
Hemoglobin: 10.4 g/dL — ABNORMAL LOW (ref 12.0–15.0)
MCH: 29.3 pg (ref 26.0–34.0)
MCHC: 35.1 g/dL (ref 30.0–36.0)
MCV: 83.4 fL (ref 80.0–100.0)
Platelets: 222 10*3/uL (ref 150–400)
RBC: 3.55 MIL/uL — ABNORMAL LOW (ref 3.87–5.11)
RDW: 18.8 % — ABNORMAL HIGH (ref 11.5–15.5)
WBC: 12.9 10*3/uL — ABNORMAL HIGH (ref 4.0–10.5)
nRBC: 0.5 % — ABNORMAL HIGH (ref 0.0–0.2)

## 2023-11-25 LAB — BASIC METABOLIC PANEL
Anion gap: 18 — ABNORMAL HIGH (ref 5–15)
BUN: 37 mg/dL — ABNORMAL HIGH (ref 8–23)
CO2: 22 mmol/L (ref 22–32)
Calcium: 8.3 mg/dL — ABNORMAL LOW (ref 8.9–10.3)
Chloride: 95 mmol/L — ABNORMAL LOW (ref 98–111)
Creatinine, Ser: 6.76 mg/dL — ABNORMAL HIGH (ref 0.44–1.00)
GFR, Estimated: 6 mL/min — ABNORMAL LOW (ref >60)
Glucose, Bld: 116 mg/dL — ABNORMAL HIGH (ref 70–99)
Potassium: 3.2 mmol/L — ABNORMAL LOW (ref 3.5–5.1)
Sodium: 135 mmol/L (ref 135–145)

## 2023-11-25 LAB — MAGNESIUM
Magnesium: 1.7 mg/dL (ref 1.7–2.4)
Magnesium: 1.7 mg/dL (ref 1.7–2.4)

## 2023-11-25 LAB — PHOSPHORUS
Phosphorus: 3.4 mg/dL (ref 2.5–4.6)
Phosphorus: 3.7 mg/dL (ref 2.5–4.6)

## 2023-11-25 MED ORDER — POTASSIUM CHLORIDE 10 MEQ/50ML IV SOLN: 10.0000 meq | INTRAVENOUS | Status: DC

## 2023-11-25 MED ORDER — CLEVIDIPINE BUTYRATE 0.5 MG/ML IV EMUL
0.0000 mg/h | INTRAVENOUS | Status: DC
  Administered 2023-11-25: 2 mg/h via INTRAVENOUS
  Filled 2023-11-25: qty 100

## 2023-11-25 MED ORDER — VANCOMYCIN HCL 750 MG IV SOLR
750.0000 mg | Freq: Once | INTRAVENOUS | Status: AC
  Administered 2023-11-26: 750 mg via INTRAVENOUS
  Filled 2023-11-25: qty 15

## 2023-11-25 MED ORDER — POTASSIUM CHLORIDE 20 MEQ PO PACK
40.0000 meq | PACK | Freq: Once | ORAL | Status: AC
  Administered 2023-11-25: 40 meq
  Filled 2023-11-25: qty 2

## 2023-11-25 NOTE — Consult Note (Signed)
NAME:  Carolyn Russo, MRN:  [id number], DOB:  04-21-1951, LOS: 9 ADMISSION DATE:  11/16/2023, CONSULTATION DATE:  1/30 REFERRING MD:  TRH, CHIEF COMPLAINT:  code blue   History of Present Illness:  Carolyn Russo is a 73 yo female with ESRD on HD MWF, CHF, previous CVA with residual left sided weakness, T2DM, right BKA, and seizure disorder. She presented to [facility] on 1/23 with confusion, left sided chest pain, shortness of breath, and coughing. She was found to be hypoxic on room air and placed on 2 L [place]. CT chest was done without evidence of PE.  MRI revealed a 1.3cm acute to early subacute right PICA infarct. Also was admitted with pneumonia due to COVID and RSV and was started on antibiotics for likely superimposed bacterial infection.   On 1/30 early morning the patient was ~2 hrs into dialysis when her blood pressure dropped. UF goal was decreased but since the patient's level of consciousnesses decreased, she received 300 cc NS bolus. EKG changes were noted and rapid response was activated, dialysis stopped, and blood returned to the patient. Around 6:30 AM the patient was noted to be apneic without a pulse and code blue was called. Patient was noted to have PEA arrest. CPR was done for a few minutes before ROSC was achieved. She was transferred to 41M and patient was intubated due to failure to protect airway.   Pertinent  Medical History  ESRD CHF CVA T2DM Seizures  Significant Hospital Events: Including procedures, antibiotic start and stop dates in addition to other pertinent events   1/23 admit to [facility] for acute infarct + covid/rsv pneumonia 1/30 admit to ICU after patient had PEA arrest   Interim History / Subjective:  On low dose precedex  On low dose pressor Minimal vent settings Objective   Blood pressure (!) 94/52, pulse 69, temperature 98.8 F (37.1 C), temperature source Oral, resp. rate 20, height 5\' 3"  (1.6 m), weight 76.8 kg, SpO2 100%.    Vent Mode: PRVC FiO2 (%):   [40 %] 40 % Set Rate:  [12 bmp-20 bmp] 20 bmp Vt Set:  [420 mL] 420 mL PEEP:  [5 cmH20] 5 cmH20 Pressure Support:  [12 cmH20] 12 cmH20 Plateau Pressure:  [16 cmH20-18 cmH20] 17 cmH20   Intake/Output Summary (Last 24 hours) at 11/25/2023 1022 Last data filed at 11/25/2023 0700 Gross per 24 hour  Intake 872.5 ml  Output 500 ml  Net 372.5 ml   Filed Weights   11/20/23 1500 11/20/23 1900  Weight: 79.5 kg 76.8 kg   Physical Exam: General: Critically ill-appearing, no acute distress HENT: [place], AT, ETT in place Eyes: EOMI, no scleral icterus Respiratory: Clear to auscultation bilaterally.  No crackles, wheezing or rales Cardiovascular: RRR, -M/R/G, no JVD GI: BS+, soft, nontender Extremities:-Edema,-tenderness Neuro: Eyes open, not following commands, +cough/gag CNII-XII grossly intact  Imaging, labs and test in EMR in the last 24 hours reviewed independently by me. Pertinent findings below: K 3.2 BUN/Cr c/w ESRD Hg 10.4 WBC 12.9  Resolved Hospital Problem list   N/a  Assessment & Plan:   PEA arrest: Due to hypotension presumably given preceding story. --Status post fluid resuscitation -- Initial neurologic exam reassuring moving extremities, trying to sit up, reportedly follows some commands at times   Hypotension: Presumed hypovolemic given poor p.o. intake after stroke as well as fluid removal via dialysis.  Suspect mild hypotension ongoing related to sedation meds. -- Weaned off levophed. Goal greater than 65 -- Remain broad  spectrum antibiotics. De-escalate tomorrow   Pneumonia due to COVID and RSV possible superimposed bacterial infection -- Complete course of antibiotics ceftriaxone and azithromycin for CAP -- Received course of Solu-Medrol this admission for COVID -- Abx restarted and broaded on 1/30 post-code   Ventilator dependence after PEA arrest, encephalopathy: pO2 506 on high percent FiO2 on the vent, no true hypoxemia noted. -- Full vent support -- LTVV,  4-8cc/kg IBW with goal Pplat<30 and DP<15 -- VAP bundle, stress ulcer prophylaxis -- SBT/WUA today with potential extubation, continue weaning sedation -- Reviewed ABG. Overventilation. RR decreased to 15   History of seizure disorder: -- Consolidate home dose Keppra to 500 twice daily   Acute right PICA infarct -- Continue aspirin and Brilinta, continue statin -- Needs 30-day heart monitor at discharge   ESRD on HD: Normal dialysis days Monday Wednesday Friday -- Appreciate nephrology assistance, no urgent need for dialysis currently, dialysis at their discretion   Diabetes: -- SSI  Hypokalemia: -- Replete  Best Practice (right click and "Reselect all SmartList Selections" daily)   Diet/type: NPO DVT prophylaxis prophylactic heparin  Pressure ulcer(s): pressure ulcer assessment deferred  GI prophylaxis: H2B Lines: Central line, Dialysis Catheter, Arterial Line, and yes and it is still needed Foley:  N/A Code Status:  full code Last date of multidisciplinary goals of care discussion [updated husband and son 2/1] Would be ok with re-intubation  Critical care time:     The patient is critically ill with multiple organ systems failure and requires high complexity decision making for assessment and support, frequent evaluation and titration of therapies, application of advanced monitoring technologies and extensive interpretation of multiple databases.  Independent Critical Care Time: 40 Minutes.   Mechele Collin, M.D. [facility]monary/Critical Care Medicine 11/25/2023 10:23 AM   Please see Amion for pager number to reach on-call Pulmonary and Critical Care Team.

## 2023-11-25 NOTE — Progress Notes (Signed)
Nephrology Follow-Up Consult note   Assessment/Recommendations: Carolyn Russo is a/an 73 y.o. female with a past medical history significant for ESRD, admitted for AMS w/ AHRF, CAP, COVID, RSV.       Dialysis Orders: MWF NW  4h   B350    75.5kg   2/2 bath   R fem TDC  Heparin 2600 + 1400 midrun - last HD 1/22, getting to dry wt - 3-4 drops in dry wt in last 3 wks - venofer 100 qhd thru 1/31 - mircera 150 q2, last 1/17, due 1/31   Assessment/Plan: AHRF/ CAP -  +COVID and RSV. Some volume excess during the hospitalization. Abx per primary. UF w/ HD. Now on ventilator after cardiac arrest.  Hoping for extubation today AMS/CVA - CVA found mgmt per neurology. AMS multifactorial ESRD - on HD MWF, plan on HD today with low flows HTN - Bps labile ctm Volume -mild amount of volume excess.  Ultrafiltration as needed on dialysis Anemia of esrd - Hfb at goal. ESA due on 1/31 but held. Redose if needed Secondary hyperparathyroidism - Cont home binders when taking po H/o seizure d/o - on Keppra PEA arrest- mgmt per ccm.  More stable at this time. GOC: chronically ill now w/ AMS, hypoxia and cardiac arrest.  Family wishes to continue with course of care for now   Recommendations conveyed to primary service.    Darnell Level [facility] 11/25/2023 9:01 AM  ___________________________________________________________  CC: AMS  Interval History/Subjective: Stable overnight in the ICU.  Was unable to extubate yesterday mostly related to fatigue it sounds like.  Has required a little bit of norepinephrine at times.   Medications:  Current Facility-Administered Medications  Medication Dose Route Frequency Provider Last Rate Last Admin    stroke: early stages of recovery book   Does not apply Once Nolberto Hanlon, MD       acetaminophen (TYLENOL) tablet 650 mg  650 mg Oral Q6H PRN Nolberto Hanlon, MD   650 mg at 11/22/23 1018   Or   acetaminophen (TYLENOL) suppository 650 mg  650  mg Rectal Q6H PRN Nolberto Hanlon, MD       aspirin chewable tablet 81 mg  81 mg Per Tube Daily Atway, Rayann N, DO   81 mg at 11/24/23 1007   bisacodyl (DULCOLAX) suppository 10 mg  10 mg Rectal Daily PRN Gillis Santa, MD       ceFEPIme (MAXIPIME) 1 g in sodium chloride 0.9 % 100 mL IVPB  1 g Intravenous Q24H Hunsucker, Lesia Sago, MD   Stopped at 11/24/23 1915   Chlorhexidine Gluconate Cloth 2 % PADS 6 each  6 each Topical Q0600 Gillis Santa, MD   6 each at 11/24/23 1200   chlorpheniramine-HYDROcodone (TUSSIONEX) 10-8 MG/5ML suspension 5 mL  5 mL Oral Q12H PRN Gillis Santa, MD   5 mL at 11/20/23 1352   dexmedetomidine (PRECEDEX) 400 MCG/100ML (4 mcg/mL) infusion  0-1.2 mcg/kg/hr Intravenous Titrated Migdalia Dk, MD 7.68 mL/hr at 11/25/23 0700 0.4 mcg/kg/hr at 11/25/23 0700   docusate sodium (COLACE) capsule 100 mg  100 mg Oral BID PRN Cristopher Peru, PA-C       famotidine (PEPCID) tablet 20 mg  20 mg Per Tube Daily Cherlynn Polo, Lauren E, PA-C   20 mg at 11/24/23 1000   feeding supplement (VITAL 1.5 CAL) liquid 1,000 mL  1,000 mL Per Tube Continuous Hunsucker, Lesia Sago, MD 20 mL/hr at 11/25/23 0700 Infusion Verify at 11/25/23 0700  fentaNYL (SUBLIMAZE) bolus via infusion 25-100 mcg  25-100 mcg Intravenous Q15 min PRN Hunsucker, Lesia Sago, MD   100 mcg at 11/24/23 1942   fentaNYL in NS (45mcg/ml) infusion-PREMIX  25-200 mcg/hr Intravenous Continuous Hunsucker, Lesia Sago, MD   Stopped at 11/25/23 0601   heparin injection 5,000 Units  5,000 Units Subcutaneous Q8H Gillis Santa, MD   5,000 Units at 11/25/23 0981   hydrALAZINE (APRESOLINE) injection 10 mg  10 mg Intravenous Q4H PRN Nolberto Hanlon, MD   10 mg at 11/19/23 1100   insulin aspart (novoLOG) injection 0-15 Units  0-15 Units Subcutaneous Q4H Atway, Rayann N, DO   2 Units at 11/25/23 0324   labetalol (NORMODYNE) injection 20 mg  20 mg Intravenous Q2H PRN Nolberto Hanlon, MD   20 mg at 11/19/23 1914   levETIRAcetam (KEPPRA) IVPB 500 mg/100  mL premix  500 mg Intravenous BID Hunsucker, Lesia Sago, MD   Stopped at 11/24/23 2227   lidocaine (LIDODERM) 5 % 1 patch  1 patch Transdermal Q24H Berton Mount I, MD   1 patch at 11/24/23 2031   multivitamin (RENA-VIT) tablet 1 tablet  1 tablet Per Tube QHS Hunsucker, Lesia Sago, MD   1 tablet at 11/24/23 2213   norepinephrine (LEVOPHED) 4mg  in (0.016 mg/mL) premix infusion  0-40 mcg/min Intravenous Titrated Atway, Rayann N, DO 7.5 mL/hr at 11/25/23 0700 2 mcg/min at 11/25/23 0700   ondansetron (ZOFRAN) injection 4 mg  4 mg Intravenous Q6H PRN Hunsucker, Lesia Sago, MD       Oral care mouth rinse  15 mL Mouth Rinse Q2H Cherlynn Polo, Lauren E, PA-C   15 mL at 11/25/23 7829   Oral care mouth rinse  15 mL Mouth Rinse PRN Cristopher Peru, PA-C       polyethylene glycol (MIRALAX / GLYCOLAX) packet 17 g  17 g Oral Daily PRN Cristopher Peru, PA-C       sevelamer carbonate (RENVELA) tablet 800 mg  800 mg Per Tube TID WC Atway, Rayann N, DO   800 mg at 11/24/23 1700   sodium chloride flush (NS) 0.9 % injection 3 mL  3 mL Intravenous Q12H Nolberto Hanlon, MD   3 mL at 11/22/23 2144   sorbitol, magnesium hydroxide, mineral oil, glycerin (SMOG) enema  960 mL Rectal Daily PRN Gillis Santa, MD       ticagrelor [facility]) tablet 90 mg  90 mg Per Tube BID Atway, Rayann N, DO   90 mg at 11/24/23 2213   vancomycin (VANCOREADY) IVPB 750 mg/150 mL  750 mg Intravenous Once Hunsucker, Lesia Sago, MD       vancomycin variable dose per unstable renal function (pharmacist dosing)   Does not apply See admin instructions Hunsucker, Lesia Sago, MD       witch hazel-glycerin (TUCKS) pad   Topical PRN Gillis Santa, MD          Review of Systems: Unable to obtain due to the patient's sedation  Physical Exam: Vitals:   11/25/23 0645 11/25/23 0700  BP:    Pulse: 71 70  Resp: 20 20  Temp:    SpO2: 100% 100%   No intake/output data recorded.  Intake/Output Summary (Last 24 hours) at 11/25/2023 0902 Last data filed at  11/25/2023 0700 Gross per 24 hour  Intake 890.74 ml  Output 500 ml  Net 390.74 ml   GEN: Chronically ill-appearing, lying in bed, no distress ENT: no nasal discharge, mmm, ET tube in place EYES: no  scleral icterus, eyes closed CV: normal rate, PULM: Bilateral chest rise, ventilated ABD: NABS, non-distended SKIN: no rashes or jaundice EXT: minimal edema, warm and well perfused    Test Results I personally reviewed new and old clinical labs and radiology tests Lab Results  Component Value Date   NA 135 11/25/2023   K 3.2 (L) 11/25/2023   CL 95 (L) 11/25/2023   CO2 22 11/25/2023   BUN 37 (H) 11/25/2023   CREATININE 6.76 (H) 11/25/2023   GLU 366 06/19/2020   CALCIUM 8.3 (L) 11/25/2023   ALBUMIN 3.0 (L) 11/24/2023   PHOS 3.4 11/25/2023    CBC Recent Labs  Lab 11/20/23 0733 11/21/23 0746 11/23/23 0952 11/24/23 0434 11/25/23 0320  WBC 9.6   < > 14.1* 11.4* 12.9*  NEUTROABS 6.6  --   --   --   --   HGB 10.7*   < > 12.6 11.1* 10.4*  HCT 32.0*   < > 36.2 32.6* 29.6*  MCV 86.7   < > 84.6 85.1 83.4  PLT 338   < > 311 257 222   < > = values in this interval not displayed.

## 2023-11-25 DEATH — deceased

## 2023-11-26 ENCOUNTER — Inpatient Hospital Stay (HOSPITAL_COMMUNITY): Payer: Medicare PPO

## 2023-11-26 DIAGNOSIS — G9340 Encephalopathy, unspecified: Secondary | ICD-10-CM | POA: Diagnosis not present

## 2023-11-26 DIAGNOSIS — I469 Cardiac arrest, cause unspecified: Secondary | ICD-10-CM | POA: Diagnosis not present

## 2023-11-26 DIAGNOSIS — I959 Hypotension, unspecified: Secondary | ICD-10-CM | POA: Diagnosis not present

## 2023-11-26 LAB — RENAL FUNCTION PANEL
Albumin: 2.6 g/dL — ABNORMAL LOW (ref 3.5–5.0)
Anion gap: 18 — ABNORMAL HIGH (ref 5–15)
BUN: 47 mg/dL — ABNORMAL HIGH (ref 8–23)
CO2: 20 mmol/L — ABNORMAL LOW (ref 22–32)
Calcium: 8.6 mg/dL — ABNORMAL LOW (ref 8.9–10.3)
Chloride: 96 mmol/L — ABNORMAL LOW (ref 98–111)
Creatinine, Ser: 7.45 mg/dL — ABNORMAL HIGH (ref 0.44–1.00)
GFR, Estimated: 5 mL/min — ABNORMAL LOW (ref >60)
Glucose, Bld: 232 mg/dL — ABNORMAL HIGH (ref 70–99)
Phosphorus: 4 mg/dL (ref 2.5–4.6)
Potassium: 4.4 mmol/L (ref 3.5–5.1)
Sodium: 134 mmol/L — ABNORMAL LOW (ref 135–145)

## 2023-11-26 LAB — GLUCOSE, CAPILLARY
Glucose-Capillary: 112 mg/dL — ABNORMAL HIGH (ref 70–99)
Glucose-Capillary: 128 mg/dL — ABNORMAL HIGH (ref 70–99)
Glucose-Capillary: 152 mg/dL — ABNORMAL HIGH (ref 70–99)
Glucose-Capillary: 240 mg/dL — ABNORMAL HIGH (ref 70–99)
Glucose-Capillary: 245 mg/dL — ABNORMAL HIGH (ref 70–99)
Glucose-Capillary: 248 mg/dL — ABNORMAL HIGH (ref 70–99)
Glucose-Capillary: 83 mg/dL (ref 70–99)
Glucose-Capillary: <10 mg/dL — CL (ref 70–99)

## 2023-11-26 LAB — CBC WITH DIFFERENTIAL/PLATELET
Abs Immature Granulocytes: 0.23 10*3/uL — ABNORMAL HIGH (ref 0.00–0.07)
Basophils Absolute: 0 10*3/uL (ref 0.0–0.1)
Basophils Relative: 0 %
Eosinophils Absolute: 0.2 10*3/uL (ref 0.0–0.5)
Eosinophils Relative: 1 %
HCT: 30.7 % — ABNORMAL LOW (ref 36.0–46.0)
Hemoglobin: 10.5 g/dL — ABNORMAL LOW (ref 12.0–15.0)
Immature Granulocytes: 1 %
Lymphocytes Relative: 5 %
Lymphs Abs: 1 10*3/uL (ref 0.7–4.0)
MCH: 29.8 pg (ref 26.0–34.0)
MCHC: 34.2 g/dL (ref 30.0–36.0)
MCV: 87.2 fL (ref 80.0–100.0)
Monocytes Absolute: 2 10*3/uL — ABNORMAL HIGH (ref 0.1–1.0)
Monocytes Relative: 9 %
Neutro Abs: 18.5 10*3/uL — ABNORMAL HIGH (ref 1.7–7.7)
Neutrophils Relative %: 84 %
Platelets: 218 10*3/uL (ref 150–400)
RBC: 3.52 MIL/uL — ABNORMAL LOW (ref 3.87–5.11)
RDW: 18.8 % — ABNORMAL HIGH (ref 11.5–15.5)
WBC: 22 10*3/uL — ABNORMAL HIGH (ref 4.0–10.5)
nRBC: 0.2 % (ref 0.0–0.2)

## 2023-11-26 LAB — MAGNESIUM
Magnesium: 2 mg/dL (ref 1.7–2.4)
Magnesium: 1.7 mg/dL (ref 1.7–2.4)

## 2023-11-26 LAB — PHOSPHORUS: Phosphorus: 2.7 mg/dL (ref 2.5–4.6)

## 2023-11-26 MED ORDER — VANCOMYCIN HCL 750 MG/150ML IV SOLN
750.0000 mg | INTRAVENOUS | Status: DC
  Filled 2023-11-26: qty 150

## 2023-11-26 MED ORDER — ANTICOAGULANT SODIUM CITRATE 4% (200MG/5ML) IV SOLN
5.0000 mL | Freq: Once | Status: AC
  Administered 2023-11-26: 3.6 mL
  Filled 2023-11-26: qty 5

## 2023-11-26 NOTE — Progress Notes (Signed)
Nephrology Follow-Up Consult note   Assessment/Recommendations: Carolyn Russo is a/an 73 y.o. female with a past medical history significant for ESRD, admitted for AMS w/ AHRF, CAP, COVID, RSV.       Dialysis Orders: MWF NW  4h   B350    75.5kg   2/2 bath   R fem TDC  Heparin 2600 + 1400 midrun - last HD 1/22, getting to dry wt - 3-4 drops in dry wt in last 3 wks - venofer 100 qhd thru 1/31 - mircera 150 q2, last 1/17, due 1/31   Assessment/Plan: AHRF/ CAP -  +COVID and RSV. Some volume excess during the hospitalization. Abx per primary. UF w/ HD. Now on ventilator after cardiac arrest.  Vent weaning as able AMS/CVA - CVA found mgmt per neurology. AMS multifactorial ESRD - on HD MWF, received dialysis on Saturday.  Plan for dialysis again tomorrow HTN/shock- Bps labile ctm.  Norepinephrine as needed Volume -mild amount of volume excess.  Ultrafiltration as needed on dialysis Anemia of esrd - Hfb at goal. ESA due on 1/31 but held. Redose if needed Secondary hyperparathyroidism - Cont home binders when taking po H/o seizure d/o - on Keppra PEA arrest- mgmt per ccm.  Possibly related to hypovolemia but somewhat unclear GOC: chronically ill now w/ AMS, hypoxia and cardiac arrest.  Family wishes to continue with course of care for now   Recommendations conveyed to primary service.    Darnell Level [facility] 11/26/2023 8:41 AM  ___________________________________________________________  CC: AMS  Interval History/Subjective: Tolerated dialysis overnight with 1.3 L removed.  On a little bit of norepinephrine intermittently.  Did require norepinephrine during dialysis   Medications:  Current Facility-Administered Medications  Medication Dose Route Frequency Provider Last Rate Last Admin    stroke: early stages of recovery book   Does not apply Once Nolberto Hanlon, MD       acetaminophen (TYLENOL) tablet 650 mg  650 mg Oral Q6H PRN Nolberto Hanlon, MD   650 mg at  11/22/23 1018   Or   acetaminophen (TYLENOL) suppository 650 mg  650 mg Rectal Q6H PRN Nolberto Hanlon, MD       aspirin chewable tablet 81 mg  81 mg Per Tube Daily Atway, Rayann N, DO   81 mg at 11/25/23 0957   bisacodyl (DULCOLAX) suppository 10 mg  10 mg Rectal Daily PRN Gillis Santa, MD       ceFEPIme (MAXIPIME) 1 g in sodium chloride 0.9 % 100 mL IVPB  1 g Intravenous Q24H Hunsucker, Lesia Sago, MD 200 mL/hr at 11/25/23 1807 1 g at 11/25/23 1807   Chlorhexidine Gluconate Cloth 2 % PADS 6 each  6 each Topical Q0600 Gillis Santa, MD   6 each at 11/25/23 1400   chlorpheniramine-HYDROcodone (TUSSIONEX) 10-8 MG/5ML suspension 5 mL  5 mL Oral Q12H PRN Gillis Santa, MD   5 mL at 11/20/23 1352   clevidipine (CLEVIPREX) infusion 0.5 mg/mL  0-21 mg/hr Intravenous Continuous Luciano Cutter, MD   Stopped at 11/25/23 1717   dexmedetomidine (PRECEDEX) 400 MCG/100ML (4 mcg/mL) infusion  0-1.2 mcg/kg/hr Intravenous Titrated Migdalia Dk, MD 3.84 mL/hr at 11/26/23 0600 0.2 mcg/kg/hr at 11/26/23 0600   docusate sodium (COLACE) capsule 100 mg  100 mg Oral BID PRN Cristopher Peru, PA-C       famotidine (PEPCID) tablet 20 mg  20 mg Per Tube Daily Mertha Baars E, PA-C   20 mg at 11/25/23 0957   feeding supplement (  VITAL 1.5 CAL) liquid 1,000 mL  1,000 mL Per Tube Continuous Hunsucker, Lesia Sago, MD 20 mL/hr at 11/26/23 0600 Infusion Verify at 11/26/23 0600   fentaNYL (SUBLIMAZE) bolus via infusion 25-100 mcg  25-100 mcg Intravenous Q15 min PRN Hunsucker, Lesia Sago, MD   100 mcg at 11/24/23 1942   fentaNYL in NS (24mcg/ml) infusion-PREMIX  25-200 mcg/hr Intravenous Continuous Hunsucker, Lesia Sago, MD 2.5 mL/hr at 11/26/23 0837 25 mcg/hr at 11/26/23 0837   heparin injection 5,000 Units  5,000 Units Subcutaneous Q8H Gillis Santa, MD   5,000 Units at 11/26/23 0543   hydrALAZINE (APRESOLINE) injection 10 mg  10 mg Intravenous Q4H PRN Nolberto Hanlon, MD   10 mg at 11/25/23 1609   insulin aspart  (novoLOG) injection 0-15 Units  0-15 Units Subcutaneous Q4H Atway, Rayann N, DO   3 Units at 11/26/23 0831   labetalol (NORMODYNE) injection 20 mg  20 mg Intravenous Q2H PRN Nolberto Hanlon, MD   20 mg at 11/19/23 1914   levETIRAcetam (KEPPRA) IVPB 500 mg/100 mL premix  500 mg Intravenous BID Hunsucker, Lesia Sago, MD   Stopped at 11/25/23 2230   lidocaine (LIDODERM) 5 % 1 patch  1 patch Transdermal Q24H Berton Mount I, MD   1 patch at 11/25/23 2058   multivitamin (RENA-VIT) tablet 1 tablet  1 tablet Per Tube QHS Hunsucker, Lesia Sago, MD   1 tablet at 11/25/23 2217   norepinephrine (LEVOPHED) 4mg  in (0.016 mg/mL) premix infusion  0-40 mcg/min Intravenous Titrated Atway, Rayann N, DO 11.25 mL/hr at 11/26/23 0834 3 mcg/min at 11/26/23 0834   ondansetron (ZOFRAN) injection 4 mg  4 mg Intravenous Q6H PRN Hunsucker, Lesia Sago, MD       Oral care mouth rinse  15 mL Mouth Rinse Q2H Cherlynn Polo, Lauren E, PA-C   15 mL at 11/26/23 0800   Oral care mouth rinse  15 mL Mouth Rinse PRN Cristopher Peru, PA-C       polyethylene glycol (MIRALAX / GLYCOLAX) packet 17 g  17 g Oral Daily PRN Cristopher Peru, PA-C       sevelamer carbonate (RENVELA) tablet 800 mg  800 mg Per Tube TID WC Atway, Rayann N, DO   800 mg at 11/26/23 0831   sodium chloride flush (NS) 0.9 % injection 3 mL  3 mL Intravenous Conchita Paris, MD   3 mL at 11/25/23 2211   sorbitol, magnesium hydroxide, mineral oil, glycerin (SMOG) enema  960 mL Rectal Daily PRN Gillis Santa, MD       ticagrelor [facility]) tablet 90 mg  90 mg Per Tube BID Atway, Rayann N, DO   90 mg at 11/25/23 2217   vancomycin variable dose per unstable renal function (pharmacist dosing)   Does not apply See admin instructions Hunsucker, Lesia Sago, MD       witch hazel-glycerin (TUCKS) pad   Topical PRN Gillis Santa, MD          Review of Systems: Unable to obtain due to the patient's inability to answer questions related to AMS/sedation  Physical Exam: Vitals:    11/26/23 0630 11/26/23 0645  BP:    Pulse: 77 78  Resp: 15 16  Temp:    SpO2: 100% 100%   Total I/O In: 43.8 [I.V.:23.8; NG/GT:20] Out: -   Intake/Output Summary (Last 24 hours) at 11/26/2023 0841 Last data filed at 11/26/2023 0800 Gross per 24 hour  Intake 1330.62 ml  Output 1300 ml  Net 30.62  ml   GEN: Chronically ill-appearing, lying in bed, no distress ENT: no nasal discharge, mmm, ET tube in place EYES: no scleral icterus, eyes closed CV: normal rate, no rub PULM: Bilateral chest rise, ventilated ABD: NABS, non-distended SKIN: no rashes or jaundice EXT: minimal edema, warm and well perfused    Test Results I personally reviewed new and old clinical labs and radiology tests Lab Results  Component Value Date   NA 134 (L) 11/26/2023   K 4.4 11/26/2023   CL 96 (L) 11/26/2023   CO2 20 (L) 11/26/2023   BUN 47 (H) 11/26/2023   CREATININE 7.45 (H) 11/26/2023   GLU 366 06/19/2020   CALCIUM 8.6 (L) 11/26/2023   ALBUMIN 2.6 (L) 11/26/2023   PHOS 4.0 11/26/2023    CBC Recent Labs  Lab 11/20/23 0733 11/21/23 0746 11/24/23 0434 11/25/23 0320 11/25/23 1105 11/26/23 0200  WBC 9.6   < > 11.4* 12.9*  --  22.0*  NEUTROABS 6.6  --   --   --   --  18.5*  HGB 10.7*   < > 11.1* 10.4* 11.6* 10.5*  HCT 32.0*   < > 32.6* 29.6* 34.0* 30.7*  MCV 86.7   < > 85.1 83.4  --  87.2  PLT 338   < > 257 222  --  218   < > = values in this interval not displayed.

## 2023-11-26 NOTE — Progress Notes (Signed)
NAME:  Carolyn Russo, MRN:  [id number], DOB:  1950-11-25, LOS: 10 ADMISSION DATE:  11/16/2023, CONSULTATION DATE:  1/30 REFERRING MD:  TRH, CHIEF COMPLAINT:  code blue   History of Present Illness:  Carolyn Russo is a 73 yo female with ESRD on HD MWF, CHF, previous CVA with residual left sided weakness, T2DM, right BKA, and seizure disorder. She presented to [facility] on 1/23 with confusion, left sided chest pain, shortness of breath, and coughing. She was found to be hypoxic on room air and placed on 2 L [place]. CT chest was done without evidence of PE.  MRI revealed a 1.3cm acute to early subacute right PICA infarct. Also was admitted with pneumonia due to COVID and RSV and was started on antibiotics for likely superimposed bacterial infection.   On 1/30 early morning the patient was ~2 hrs into dialysis when her blood pressure dropped. UF goal was decreased but since the patient's level of consciousnesses decreased, she received 300 cc NS bolus. EKG changes were noted and rapid response was activated, dialysis stopped, and blood returned to the patient. Around 6:30 AM the patient was noted to be apneic without a pulse and code blue was called. Patient was noted to have PEA arrest. CPR was done for a few minutes before ROSC was achieved. She was transferred to 62M and patient was intubated due to failure to protect airway.   Pertinent  Medical History  ESRD CHF CVA T2DM Seizures  Significant Hospital Events: Including procedures, antibiotic start and stop dates in addition to other pertinent events   1/23 admit to [facility] for acute infarct + covid/rsv pneumonia 1/30 admit to ICU after patient had PEA arrest  2/1 Failed SBT due to apnea. ABG with respiratory alkalosis. RR decreased 2/2 Tolerating SBT. Remains encephalopathic  Interim History / Subjective:  Intermittently requires levophed Minimal vent settings Intermittently agitated when weaned on sedation associated with hypertension Objective    Blood pressure (!) 160/44, pulse 81, temperature 99.2 F (37.3 C), temperature source Oral, resp. rate 18, height 5\' 3"  (1.6 m), weight 73 kg, SpO2 100%.    Vent Mode: PSV;CPAP FiO2 (%):  [40 %] 40 % Set Rate:  [15 bmp] 15 bmp Vt Set:  [420 mL] 420 mL PEEP:  [5 cmH20] 5 cmH20 Pressure Support:  [12 cmH20] 12 cmH20 Plateau Pressure:  [17 cmH20-19 cmH20] 17 cmH20   Intake/Output Summary (Last 24 hours) at 11/26/2023 0941 Last data filed at 11/26/2023 0800 Gross per 24 hour  Intake 1295.44 ml  Output 1300 ml  Net -4.56 ml   Filed Weights   11/20/23 1900 11/26/23 0151 11/26/23 0533  Weight: 76.8 kg 73.9 kg 73 kg   Physical Exam: General: Critically ill-appearing, intermittently agitated, encephalopathic HENT: [place], AT, ETT in place Eyes: EOMI, no scleral icterus Respiratory: Diminished but clear to auscultation bilaterally.  No crackles, wheezing or rales Cardiovascular: RRR, -M/R/G, no JVD GI: BS+, soft, nontender Extremities: Right BKA, -Edema,-tenderness Neuro: Eyes open, pupils pinpoint but equal and reactive, moves extremities x 4  Imaging, labs and test in EMR in the last 24 hours reviewed independently by me. Pertinent findings below: K 4.4 normalized BUN/Cr c/w ESRD Hg 10.5 WBC 22, worsening  Resolved Hospital Problem list   N/a  Assessment & Plan:   PEA arrest: Due to hypotension presumably given preceding story. Acute encephalopathy: post-arrest vs delirium vs drug induced --Status post fluid resuscitation -- Initial neurologic exam reassuring moving extremities, trying to sit up, reportedly follows some  commands at times --2/1 no longer consistent following commands with concern for oversedation, delirium. Will consider CT head if persistently altered   Hypotension: Presumed hypovolemic given poor p.o. intake after stroke as well as fluid removal via dialysis.  Suspect mild hypotension ongoing related to sedation meds. -- Intermittently requiring levophed. Goal  greater than 65 -- Remain broad spectrum antibiotics   Pneumonia due to COVID and RSV possible superimposed bacterial infection -- Complete course of antibiotics ceftriaxone and azithromycin for CAP -- Received course of Solu-Medrol this admission for COVID -- Abx restarted and broaded on 1/30 post-code   Ventilator dependence after PEA arrest, encephalopathy: pO2 506 on high percent FiO2 on the vent, no true hypoxemia noted. -Full vent support -LTVV, 4-8cc/kg IBW with goal Pplat<30 and DP<15 -- VAP bundle, stress ulcer prophylaxis -- SBT/WUA with potential extubation, continue weaning sedation   History of seizure disorder: -- Consolidate home dose Keppra to 500 twice daily   Acute right PICA infarct -- Continue aspirin and Brilinta, continue statin -- Needs 30-day heart monitor at discharge   ESRD on HD: Normal dialysis days Monday Wednesday Friday -- Appreciate nephrology assistance, no urgent need for dialysis currently, dialysis at their discretion   Diabetes: -- SSI  Hypokalemia: --Trend -- Replete PRN  Best Practice (right click and "Reselect all SmartList Selections" daily)   Diet/type: tubefeeds DVT prophylaxis prophylactic heparin  Pressure ulcer(s): pressure ulcer assessment deferred  GI prophylaxis: H2B Lines: Central line, Dialysis Catheter, Arterial Line, and yes and it is still needed Foley:  N/A Code Status:  full code Last date of multidisciplinary goals of care discussion [updated husband and son 2/1] Would be ok with re-intubation  Critical care time:     The patient is critically ill with multiple organ systems failure and requires high complexity decision making for assessment and support, frequent evaluation and titration of therapies, application of advanced monitoring technologies and extensive interpretation of multiple databases.  Independent Critical Care Time: 45 Minutes.   Mechele Collin, M.D. [facility] Pulmonary/Critical Care  Medicine 11/26/2023 9:41 AM   Please see Amion for pager number to reach on-call Pulmonary and Critical Care Team.

## 2023-11-26 NOTE — Progress Notes (Signed)
Pharmacy Antibiotic Note  Carolyn Russo is a 72 y.o. female admitted on 11/16/2023 with sepsis. Patient was treated for community aquired pneumonia this admission, has completed a 5-day course of ceftriaxone and azithromycin on 11/21/23. Noted to also be positive for covid and RSV this admission. Patient went into cardiac arrest on 11/23/23 AM. Pharmacy has been consulted for vancomycin and cefepime dosing.  Per nephology, next HD 2/3.  Plan: Vancomycin 750 mg x1 ordered for tomorrow after HD  Cefepime 1 gram IV daily  Monitor HD schedule and vancomycin levels as appropriate Monitor Bcx and clinical progression F/u LOT and narrow antibiotics as appropriate  Height: 5\' 3"  (160 cm) Weight: 73 kg (160 lb 15 oz) IBW/kg (Calculated) : 52.4  Temp (24hrs), Avg:98.3 F (36.8 C), Min:97.6 F (36.4 C), Max:99.2 F (37.3 C)  Recent Labs  Lab 11/22/23 1026 11/23/23 0952 11/24/23 0434 11/25/23 0320 11/26/23 0200  WBC 7.6 14.1* 11.4* 12.9* 22.0*  CREATININE 5.83* 4.62* 5.52* 6.76* 7.45*    Estimated Creatinine Clearance: 6.2 mL/min (A) (by C-G formula based on SCr of 7.45 mg/dL (H)).    Allergies  Allergen Reactions   Almond Oil Anaphylaxis   Food Anaphylaxis    Peanuts - anaphylaxis     Gadolinium Derivatives Other (See Comments)    Gadolinium-Containing Contrast Media    Januvia [Sitagliptin] Other (See Comments)   Pork-Derived Products Other (See Comments)    Does not eat pork      CTX/Azith 1/23 >> 1/28 Vanc 1/30 >> Cefepime 1/30 >>   1/30 vanc 1750mg  IV 2/1 delayed until early 2/2, BFR 400 x3 hrs >> vanc 750mg    1/23 RVP - RSV 1/23 Covid - positive 1/23 BCx - negative 1/30 BCx - NGTD  Finbar Nippert D. Laney Potash, PharmD, BCPS, BCCCP 11/26/2023, 12:49 PM

## 2023-11-26 NOTE — Progress Notes (Signed)
Received patient in bed.  Intubated responsive to pain.   Informed consent signed and in chart. Family @ bedside  TX duration:3 hours  Patient tolerated well.  Patient  without acute distress.  Hand-off given to patient's nurse.   Access used: dialysis cath Access issues: none  Total UF removed: 1300 Medication(s) given: vancomycin 750mg /262ml, sodium citrate 1.8cc perport as replacement for heplock. Post HD VS: see table below Post HD weight: 73 kg bed scale   11/26/23 0527  Vitals  Temp 97.6 F (36.4 C)  Temp Source Axillary  BP (!) 160/44  MAP (mmHg) 73  BP Location Other (Comment)  BP Method Doppler  Patient Position (if appropriate) Lying  Pulse Rate 76  Pulse Rate Source Monitor  ECG Heart Rate 78  Resp 17  Oxygen Therapy  SpO2 100 %  O2 Device Ventilator  Patient Activity (if Appropriate) In bed  Pulse Oximetry Type Continuous  During Treatment Monitoring  HD Safety Checks Performed Yes  Intra-Hemodialysis Comments Tolerated well  Post Treatment  Dialyzer Clearance Lightly streaked  Hemodialysis Intake (mL) 250 mL  Liters Processed 71.8  Fluid Removed (mL) 1300 mL  Tolerated HD Treatment Yes  Hemodialysis Catheter Right Subclavian  No placement date or time found.   Orientation: Right  Access Location: Subclavian  Site Condition No complications  Blue Lumen Status Flushed;Heparin locked;Dead end cap in place  Red Lumen Status Flushed;Heparin locked;Dead end cap in place  Purple Lumen Status N/A  Catheter fill solution 4% Sodium Citrate  Catheter fill volume (Arterial) 1.8 cc  Catheter fill volume (Venous) 1.8  Dressing Type Transparent  Dressing Status Clean, Dry, Intact  Interventions New dressing  Drainage Description None  Post treatment catheter status Capped and Clamped      [facility]

## 2023-11-26 NOTE — Progress Notes (Signed)
Patient with multiple episodes of agitation, coughing and clearly uncomfortable, SBP in 200s, son at bedside and refusing PRN medication. Therapeutic communication, suctioning and repositioning tried with out relief. Precedex titrated per order. Once patient goes back to sleep SBP falls within normal limits.

## 2023-11-27 DIAGNOSIS — E119 Type 2 diabetes mellitus without complications: Secondary | ICD-10-CM

## 2023-11-27 DIAGNOSIS — G9340 Encephalopathy, unspecified: Secondary | ICD-10-CM | POA: Diagnosis not present

## 2023-11-27 DIAGNOSIS — I469 Cardiac arrest, cause unspecified: Secondary | ICD-10-CM | POA: Diagnosis not present

## 2023-11-27 DIAGNOSIS — I10 Essential (primary) hypertension: Secondary | ICD-10-CM | POA: Diagnosis not present

## 2023-11-27 DIAGNOSIS — N179 Acute kidney failure, unspecified: Secondary | ICD-10-CM | POA: Diagnosis not present

## 2023-11-27 LAB — CBC
HCT: 27.3 % — ABNORMAL LOW (ref 36.0–46.0)
Hemoglobin: 9.5 g/dL — ABNORMAL LOW (ref 12.0–15.0)
MCH: 29.9 pg (ref 26.0–34.0)
MCHC: 34.8 g/dL (ref 30.0–36.0)
MCV: 85.8 fL (ref 80.0–100.0)
Platelets: 225 10*3/uL (ref 150–400)
RBC: 3.18 MIL/uL — ABNORMAL LOW (ref 3.87–5.11)
RDW: 18.2 % — ABNORMAL HIGH (ref 11.5–15.5)
WBC: 21.3 10*3/uL — ABNORMAL HIGH (ref 4.0–10.5)
nRBC: 0.1 % (ref 0.0–0.2)

## 2023-11-27 LAB — TROPONIN I (HIGH SENSITIVITY)
Troponin I (High Sensitivity): 38 ng/L — ABNORMAL HIGH (ref <18)
Troponin I (High Sensitivity): 34 ng/L — ABNORMAL HIGH (ref <18)

## 2023-11-27 LAB — GLUCOSE, CAPILLARY
Glucose-Capillary: 130 mg/dL — ABNORMAL HIGH (ref 70–99)
Glucose-Capillary: 137 mg/dL — ABNORMAL HIGH (ref 70–99)
Glucose-Capillary: 137 mg/dL — ABNORMAL HIGH (ref 70–99)
Glucose-Capillary: 153 mg/dL — ABNORMAL HIGH (ref 70–99)
Glucose-Capillary: 200 mg/dL — ABNORMAL HIGH (ref 70–99)
Glucose-Capillary: 214 mg/dL — ABNORMAL HIGH (ref 70–99)
Glucose-Capillary: 233 mg/dL — ABNORMAL HIGH (ref 70–99)

## 2023-11-27 LAB — BASIC METABOLIC PANEL
Anion gap: 11 (ref 5–15)
BUN: 26 mg/dL — ABNORMAL HIGH (ref 8–23)
CO2: 25 mmol/L (ref 22–32)
Calcium: 8.4 mg/dL — ABNORMAL LOW (ref 8.9–10.3)
Chloride: 97 mmol/L — ABNORMAL LOW (ref 98–111)
Creatinine, Ser: 5 mg/dL — ABNORMAL HIGH (ref 0.44–1.00)
GFR, Estimated: 9 mL/min — ABNORMAL LOW (ref >60)
Glucose, Bld: 133 mg/dL — ABNORMAL HIGH (ref 70–99)
Potassium: 4.1 mmol/L (ref 3.5–5.1)
Sodium: 133 mmol/L — ABNORMAL LOW (ref 135–145)

## 2023-11-27 LAB — MAGNESIUM: Magnesium: 1.7 mg/dL (ref 1.7–2.4)

## 2023-11-27 LAB — PHOSPHORUS: Phosphorus: 2.6 mg/dL (ref 2.5–4.6)

## 2023-11-27 MED ORDER — MAGNESIUM SULFATE 2 GM/50ML IV SOLN
2.0000 g | Freq: Once | INTRAVENOUS | Status: AC
  Administered 2023-11-27: 2 g via INTRAVENOUS
  Filled 2023-11-27: qty 50

## 2023-11-27 MED ORDER — ACETAMINOPHEN 650 MG RE SUPP: 650.0000 mg | Freq: Four times a day (QID) | RECTAL | Status: DC | PRN

## 2023-11-27 MED ORDER — MIDODRINE HCL 5 MG PO TABS
5.0000 mg | ORAL_TABLET | Freq: Three times a day (TID) | ORAL | Status: DC
  Administered 2023-11-27 – 2023-11-29 (×7): 5 mg
  Filled 2023-11-27 (×7): qty 1

## 2023-11-27 MED ORDER — HYDROCOD POLI-CHLORPHE POLI ER 10-8 MG/5ML PO SUER
5.0000 mL | Freq: Two times a day (BID) | ORAL | Status: DC | PRN
  Administered 2023-11-28 – 2023-12-02 (×3): 5 mL
  Filled 2023-11-27 (×3): qty 5

## 2023-11-27 MED ORDER — DOCUSATE SODIUM 50 MG/5ML PO LIQD
100.0000 mg | Freq: Two times a day (BID) | ORAL | Status: DC
  Administered 2023-11-27 (×2): 100 mg
  Filled 2023-11-27 (×3): qty 10

## 2023-11-27 MED ORDER — VANCOMYCIN HCL 500 MG/100ML IV SOLN: 500.0000 mg | INTRAVENOUS | Status: DC

## 2023-11-27 MED ORDER — POLYETHYLENE GLYCOL 3350 17 G PO PACK
17.0000 g | PACK | Freq: Every day | ORAL | Status: DC
  Administered 2023-11-27: 17 g
  Filled 2023-11-27 (×2): qty 1

## 2023-11-27 MED ORDER — ACETAMINOPHEN 325 MG PO TABS
650.0000 mg | ORAL_TABLET | Freq: Four times a day (QID) | ORAL | Status: DC | PRN
  Administered 2023-12-05: 650 mg
  Filled 2023-11-27 (×3): qty 2

## 2023-11-27 MED ORDER — DOCUSATE SODIUM 100 MG PO CAPS: 100.0000 mg | ORAL_CAPSULE | Freq: Two times a day (BID) | ORAL | Status: DC

## 2023-11-27 MED ORDER — QUETIAPINE FUMARATE 25 MG PO TABS
12.5000 mg | ORAL_TABLET | Freq: Two times a day (BID) | ORAL | Status: DC
  Administered 2023-11-27 – 2023-12-03 (×14): 12.5 mg
  Filled 2023-11-27 (×14): qty 1

## 2023-11-27 MED ORDER — SODIUM CHLORIDE 0.9 % IV SOLN
750.0000 mg | INTRAVENOUS | Status: DC
  Filled 2023-11-27: qty 15

## 2023-11-27 MED ORDER — FAMOTIDINE 20 MG PO TABS
10.0000 mg | ORAL_TABLET | Freq: Every day | ORAL | Status: DC
  Administered 2023-11-27 – 2023-12-05 (×9): 10 mg
  Filled 2023-11-27 (×9): qty 1

## 2023-11-27 NOTE — TOC Progression Note (Signed)
Transition of Care [facility]) - Progression Note    Patient Details  Name: Carolyn Russo MRN: [id number] Date of Birth: 1951-04-16  Transition of Care [facility]) CM/SW Contact  Marliss Coots, LCSW Phone Number: 11/27/2023, 1:04 PM  Clinical Narrative:     1:04 PM Insurance offered hospitalist peer to peer for SNF authorization request. Per hospitalist, patient expected discharge date remains unknown. CSW cancelled insurance authorization request.  Expected Discharge Plan: Skilled Nursing Facility    Expected Discharge Plan and Services In-house Referral: Clinical Social Work     Living arrangements for the past 2 months: Single Family Home, Skilled Nursing Facility                                       Social Determinants of Health (SDOH) Interventions SDOH Screenings   Food Insecurity: No Food Insecurity (11/17/2023)  Housing: Low Risk  (11/17/2023)  Transportation Needs: No Transportation Needs (11/17/2023)  Utilities: Not At Risk (11/17/2023)  Alcohol Screen: Low Risk  (01/09/2019)  Depression (PHQ2-9): Low Risk  (09/11/2023)  Financial Resource Strain: Low Risk  (09/02/2021)  Physical Activity: Inactive (09/02/2021)  Social Connections: Socially Integrated (11/17/2023)  Stress: Stress Concern Present (09/02/2021)  Tobacco Use: Low Risk  (11/16/2023)    Readmission Risk Interventions    02/14/2023    4:54 PM  Readmission Risk Prevention Plan  Transportation Screening Complete  PCP or Specialist Appt within 3-5 Days Complete  HRI or Home Care Consult Complete  Social Work Consult for Recovery Care Planning/Counseling Complete  Palliative Care Screening Complete  Medication Review Oceanographer) Complete

## 2023-11-27 NOTE — Progress Notes (Signed)
   Patient Name: Carolyn Russo Date of Encounter: 11/27/2023 [facility] Cardiologist: Chilton Si, MD ***  Interval Summary  .    ***  Vital Signs .    Vitals:   11/27/23 1400 11/27/23 1500 11/27/23 1600 11/27/23 1627  BP:      Pulse: 87 76 86 92  Resp: (!) 26 (!) 23 (!) 25 (!) 27  Temp:    98.3 F (36.8 C)  TempSrc:    Oral  SpO2: 99% 100% 100% 100%  Weight:      Height:        Intake/Output Summary (Last 24 hours) at 11/27/2023 1657 Last data filed at 11/27/2023 1600 Gross per 24 hour  Intake 1573.47 ml  Output --  Net 1573.47 ml      11/27/2023    5:00 AM 11/26/2023    5:33 AM 11/26/2023    1:51 AM  Last 3 Weights  Weight (lbs) 153 lb 10.6 oz 160 lb 15 oz 162 lb 14.7 oz  Weight (kg) 69.7 kg 73 kg 73.9 kg      Telemetry/ECG    *** - Personally Reviewed  EKG 11/17/23: Sinus rhythm.  Rate 100 bpm.  PVC.  Prior inferior infarct.   Echo 11/17/23: 1. Left ventricular ejection fraction, by estimation, is 55 to 60%. The  left ventricle has normal function. The left ventricle has no regional  wall motion abnormalities. Elevated left ventricular end-diastolic  pressure.   2. Right ventricular systolic function is normal. The right ventricular  size is normal. There is severely elevated pulmonary artery systolic  pressure.   3. The mitral valve is normal in structure. Trivial mitral valve  regurgitation. No evidence of mitral stenosis.   4. The aortic valve is tricuspid. There is mild calcification of the  aortic valve. Aortic valve regurgitation is not visualized. No aortic  stenosis is present. Aortic valve mean gradient measures 11.0 mmHg. Aortic  valve Vmax measures 2.16 m/s.   5. The inferior vena cava is dilated in size with <50% respiratory  variability, suggesting right atrial pressure of 15 mmHg.    Physical Exam .   GEN: No acute distress.   Neck: No JVD Cardiac: ***RRR, no murmurs, rubs, or gallops.  Respiratory: Clear to auscultation  bilaterally. GI: Soft, nontender, non-distended  MS: No edema  Assessment & Plan .     6F with ESRD on HD, CVA, DM, r BKA, seizures and HFpEF admitted with COVID-19 and RSV pneumonia.  Initially with AMS and CVA.  On 1/30 she became hypotensive with HD and had PEA arrest requiring CPR.     # PEA arrest:  Patient became hypotensive with HD and had a PEA arrest.  Nephrology notes reports telemetry changes.  CPR was performed and there was not a shockable rhythm.  She initially followed commands but now encephalopathic and not following commands.  Requiring Precedex and Seroquel.  # CAD:  # Hyperlipidemia:  LHC in 2018 revealed heavily calcified 30-40% proximal LAD disease.  LDL 37 on 11/17/23.  HS troponin elevated 205-->199 on admission.  She reported pleuritic chest pain and shortness of breath on admission.  Systolic function preserved so there were no plans for an inpatient ischemic evaluation.  Dr. Bjorn Pippin considered outpatient stress PET after recovery.   For questions or updates, please contact [name] Please consult [email or web address] for contact info under        Signed, Chilton Si, MD

## 2023-11-27 NOTE — TOC Progression Note (Signed)
Transition of Care [facility]) - Progression Note    Patient Details  Name: Carolyn Russo MRN: [id number] Date of Birth: [birth year]  Transition of Care [facility]) CM/SW Contact  Marliss Coots, LCSW Phone Number: 11/27/2023, 8:51 AM  Clinical Narrative:     8:51 AM Per chart review, patient remains intubated and has tube feeds.  Expected Discharge Plan: Skilled Nursing Facility    Expected Discharge Plan and Services In-house Referral: Clinical Social Work     Living arrangements for the past 2 months: Single Family Home, Skilled Nursing Facility                                       Social Determinants of Health (SDOH) Interventions SDOH Screenings   Food Insecurity: No Food Insecurity (11/17/2023)  Housing: Low Risk  (11/17/2023)  Transportation Needs: No Transportation Needs (11/17/2023)  Utilities: Not At Risk (11/17/2023)  Alcohol Screen: Low Risk  (01/09/2019)  Depression (PHQ2-9): Low Risk  (09/11/2023)  Financial Resource Strain: Low Risk  (09/02/2021)  Physical Activity: Inactive (09/02/2021)  Social Connections: Socially Integrated (11/17/2023)  Stress: Stress Concern Present (09/02/2021)  Tobacco Use: Low Risk  (11/16/2023)    Readmission Risk Interventions    02/14/2023    4:54 PM  Readmission Risk Prevention Plan  Transportation Screening Complete  PCP or Specialist Appt within 3-5 Days Complete  HRI or Home Care Consult Complete  Social Work Consult for Recovery Care Planning/Counseling Complete  Palliative Care Screening Complete  Medication Review Oceanographer) Complete

## 2023-11-27 NOTE — Progress Notes (Signed)
RT NOTE: patient placed on CPAP/PSV of 8/5.  Currently tolerating well at this time.  Will continue to monitor and wean as tolerated.

## 2023-11-27 NOTE — Progress Notes (Signed)
Nephrology Follow-Up Consult note   Assessment/Recommendations: Carolyn Russo is a/an 73 y.o. female with a past medical history significant for ESRD, admitted for AMS w/ AHRF, CAP, COVID, RSV.       Dialysis Orders: MWF NW  4h   B350    75.5kg   2/2 bath   R fem TDC  Heparin 2600 + 1400 midrun - last HD 1/22, getting to dry wt - 3-4 drops in dry wt in last 3 wks - venofer 100 qhd thru 1/31 - mircera 150 q2, last 1/17, due 1/31   Assessment/Plan: AHRF/ CAP -  +COVID and RSV. Some volume excess during the hospitalization. Abx per primary. UF w/ HD. Now on ventilator after cardiac arrest.  Vent weaning as able AMS/CVA - CVA found mgmt per neurology. AMS multifactorial ESRD - on HD MWF.  HD today.  HTN/shock- Bps labile ctm.  Norepinephrine as needed Volume - down 5kg from dry wt now. UF 1-2 L w/ HD. Euvolemic on exam Anemia of esrd - Hfb at goal. ESA due on 1/31 but held. Redose if needed Secondary hyperparathyroidism - Cont home binders when taking po H/o seizure d/o - on Keppra PEA arrest- mgmt per ccm.  Possibly related to hypovolemia but somewhat unclear GOC: chronically ill now w/ AMS, hypoxia and cardiac arrest.  Family wishes to continue with course of care for now    [name] [facility] 11/27/2023 3:00 PM  ___________________________________________________________  Interval History/Subjective: on vent and sedated    Physical Exam: Vitals:   11/27/23 0729 11/27/23 0800  BP:    Pulse: 87 98  Resp: (!) 21 20  Temp:    SpO2: 100% 99%   GEN: Chronically ill-appearing, lying in bed, no distress ENT: no nasal discharge, mmm, ET tube in place EYES: no scleral icterus, eyes closed CV: normal rate, no rub PULM: Bilateral chest rise, ventilated ABD: NABS, non-distended SKIN: no rashes or jaundice EXT: minimal edema, warm and well perfused    Recent Labs  Lab 11/24/23 0434 11/25/23 0320 11/26/23 0200 11/26/23 1839 11/27/23 0516  HGB  11.1*   < > 10.5*  --  9.5*  ALBUMIN 3.0*  --  2.6*  --   --   CALCIUM 8.8*   < > 8.6*  --  8.4*  PHOS  --    < > 4.0 2.7 2.6  CREATININE 5.52*   < > 7.45*  --  5.00*  K 3.4*   < > 4.4  --  4.1   < > = values in this interval not displayed.    Inpatient medications:   stroke: early stages of recovery book   Does not apply Once   aspirin  81 mg Per Tube Daily   Chlorhexidine Gluconate Cloth  6 each Topical Q0600   docusate  100 mg Per Tube BID   famotidine  10 mg Per Tube Daily   heparin injection (subcutaneous)  5,000 Units Subcutaneous Q8H   insulin aspart  0-15 Units Subcutaneous Q4H   lidocaine  1 patch Transdermal Q24H   midodrine  5 mg Per Tube Q8H   multivitamin  1 tablet Per Tube QHS   mouth rinse  15 mL Mouth Rinse Q2H   polyethylene glycol  17 g Per Tube Daily   QUEtiapine  12.5 mg Per Tube BID   sevelamer carbonate  800 mg Per Tube TID WC   sodium chloride flush  3 mL Intravenous Q12H   ticagrelor  90 mg  Per Tube BID    ceFEPime (MAXIPIME) IV Stopped (11/26/23 2222)   dexmedetomidine (PRECEDEX) IV infusion 0.4 mcg/kg/hr (11/27/23 0825)   feeding supplement (VITAL 1.5 CAL) 45 mL/hr at 11/27/23 0600   levETIRAcetam 500 mg (11/27/23 1032)   norepinephrine (LEVOPHED) Adult infusion Stopped (11/27/23 1315)   acetaminophen **OR** acetaminophen, bisacodyl, chlorpheniramine-HYDROcodone, hydrALAZINE, labetalol, ondansetron (ZOFRAN) IV, mouth rinse, SMOG, witch hazel-glycerin

## 2023-11-27 NOTE — Progress Notes (Signed)
NAME:  Carolyn Russo, MRN:  [id number], DOB:  1951/02/14, LOS: 11 ADMISSION DATE:  11/16/2023, CONSULTATION DATE:  1/30 REFERRING MD:  TRH, CHIEF COMPLAINT:  code blue   History of Present Illness:  Carolyn Russo is a 73 yo female with ESRD on HD MWF, CHF, previous CVA with residual left sided weakness, T2DM, right BKA, and seizure disorder. She presented to [facility] on 1/23 with confusion, left sided chest pain, shortness of breath, and coughing. She was found to be hypoxic on room air and placed on 2 L [place]. CT chest was done without evidence of PE.  MRI revealed a 1.3cm acute to early subacute right PICA infarct. Also was admitted with pneumonia due to COVID and RSV and was started on antibiotics for likely superimposed bacterial infection.    On 1/30 early morning the patient was ~2 hrs into dialysis when her blood pressure dropped. UF goal was decreased but since the patient's level of consciousnesses decreased, she received 300 cc NS bolus. EKG changes were noted and rapid response was activated, dialysis stopped, and blood returned to the patient. Around 6:30 AM the patient was noted to be apneic without a pulse and code blue was called. Patient was noted to have PEA arrest. CPR was done for a few minutes before ROSC was achieved. She was transferred to 63M and patient was intubated due to failure to protect airway.  Pertinent  Medical History  ESRD CHF CVA T2DM Seizures  Significant Hospital Events: Including procedures, antibiotic start and stop dates in addition to other pertinent events   1/23 admit to [facility] for acute infarct + covid/rsv pneumonia 1/30 admit to ICU after patient had PEA arrest  2/1 Failed SBT due to apnea. ABG with respiratory alkalosis. RR decreased 2/2 Tolerating SBT. Remains encephalopathic  Interim History / Subjective:   Tolerated SBT during the day yesterday but overnight had multiple episodes of agitation due to coughing spells, precedex required titration. This  morning she remains sedated and is not following any commands.   Objective   Blood pressure (!) 160/44, pulse 70, temperature 98.7 F (37.1 C), temperature source Oral, resp. rate 15, height 5\' 3"  (1.6 m), weight 69.7 kg, SpO2 100%.    Vent Mode: PRVC FiO2 (%):  [35 %-40 %] 35 % Set Rate:  [15 bmp] 15 bmp Vt Set:  [420 mL] 420 mL PEEP:  [5 cmH20] 5 cmH20 Pressure Support:  [12 cmH20] 12 cmH20 Plateau Pressure:  [14 cmH20] 14 cmH20   Intake/Output Summary (Last 24 hours) at 11/27/2023 0602 Last data filed at 11/27/2023 0102 Gross per 24 hour  Intake 1411.18 ml  Output --  Net 1411.18 ml   Filed Weights   11/26/23 0151 11/26/23 0533 11/27/23 0500  Weight: 73.9 kg 73 kg 69.7 kg    Examination: General: critically ill appearing female laying in bed, encephalopathic HENT: ETT in place, pupils sluggish but reactive Lungs: Diminished, but CTAB.  Cardiovascular: regular rate, rhythm. No murmurs Abdomen: soft, nontender, +BS Extremities: s/p R BKA, no edema Neuro: opens eyes occasionally, but is not following any commands. Moves all extremities.  GU: deferred  Labs, tests, and imaging in the EMR in the last 24 hrs indepenendly reviewed by me. Pertinent findings below Na 133 K 4.1 BUN/Cr c/w ESRD  Bicarb 25 Phos 2.6 Mg 1.7  WBC 21.3 Hb 9.5 Plt 225  CBGs 133-245  Resolved Hospital Problem list   Hypokalemia  Assessment & Plan:   PEA arrest: due to hypotension  Acute encephalopathy: secondary to post-arrest vs delirium vs drug induced - Patient remains persistently altered, no longer following commands, will get CT head this morning  - Precedex gtt  - Consider neuro consult if CTH negative - Cardiology consulted due to hx of ectopy/concern that this is etiology of cardiac arrest although hypotension is more likely  - Start seroquel 12.5 mg bid for agitation  Hypotension: presumed hypovolemic initially, now likely related to ongoing sedation  - Wean levophed as able,  MAP goal >65 - Start midodrine 5 mg q8h - BP has been labile, especially during periods of agitation, was on cleviprex briefly  - Continue cefepime, discontinue vanc   Pneumonia due to COVID and RSV Received empiric treatment with ceftriaxone and azithro for possible superimposed bacterial pneumonia and also s/p course of solumedrol. - Restarted abx post-code, as aboev - blood cultures negative at 3 days   Ventilator dependence after PEA arrest Patient was not ever truly hypoxemic, was intubated for failure to protect airway. - Full vent support: PRVC, RR 15, TV 420, PEEP 5, FiO2 35% - VAP bundle, stress ulcer prophylaxis - Daily SBT, continue to wean sedation (mentation precludes extubation)  Seizure disorder - Continue keppra 500 mg BID  Acute to early subacute right PICA infarct  - Continue DAPT with aspirin and brilinta - Continue statin - Needs 30d heart monitor at discharge  ESRD on HD MWF - Dialysis per nephro, last HD session on Saturday and plan for HD today   Anemia of chronic disease - Hb 9.5 - ESA held on 1/31 per nephro   Diabetes - Continue SSI   Best Practice (right click and "Reselect all SmartList Selections" daily)   Diet/type: tubefeeds DVT prophylaxis prophylactic heparin  Pressure ulcer(s): pressure ulcer assessment deferred  GI prophylaxis: H2B Lines: R subclavian HD cath, R femoral CVC, R femoral art line Foley:  N/A Code Status:  full code Last date of multidisciplinary goals of care discussion [2/3 updated husband]  Labs   CBC: Recent Labs  Lab 11/20/23 0733 11/21/23 0746 11/23/23 0952 11/24/23 0434 11/25/23 0320 11/25/23 1105 11/26/23 0200 11/27/23 0516  WBC 9.6   < > 14.1* 11.4* 12.9*  --  22.0* 21.3*  NEUTROABS 6.6  --   --   --   --   --  18.5*  --   HGB 10.7*   < > 12.6 11.1* 10.4* 11.6* 10.5* 9.5*  HCT 32.0*   < > 36.2 32.6* 29.6* 34.0* 30.7* 27.3*  MCV 86.7   < > 84.6 85.1 83.4  --  87.2 85.8  PLT 338   < > 311 257 222  --   218 225   < > = values in this interval not displayed.    Basic Metabolic Panel: Recent Labs  Lab 11/23/23 0952 11/24/23 0434 11/25/23 0320 11/25/23 1105 11/25/23 1736 11/26/23 0200 11/26/23 1839 11/27/23 0516  NA 133* 134* 135 134*  --  134*  --  133*  K 3.4* 3.4* 3.2* 3.1*  --  4.4  --  4.1  CL 93* 95* 95*  --   --  96*  --  97*  CO2 22 21* 22  --   --  20*  --  25  GLUCOSE 222* 84 116*  --   --  232*  --  133*  BUN 21 28* 37*  --   --  47*  --  26*  CREATININE 4.62* 5.52* 6.76*  --   --  7.45*  --  5.00*  CALCIUM 8.7* 8.8* 8.3*  --   --  8.6*  --  8.4*  MG 1.8  --  1.7  --  1.7 2.0 1.7 1.7  PHOS  --   --  3.4  --  3.7 4.0 2.7 2.6   GFR: Estimated Creatinine Clearance: 9.1 mL/min (A) (by C-G formula based on SCr of 5 mg/dL (H)). Recent Labs  Lab 11/24/23 0434 11/25/23 0320 11/26/23 0200 11/27/23 0516  WBC 11.4* 12.9* 22.0* 21.3*    Liver Function Tests: Recent Labs  Lab 11/20/23 0733 11/24/23 0434 11/26/23 0200  AST  --  40  --   ALT  --  69*  --   ALKPHOS  --  80  --   BILITOT  --  1.5*  --   PROT  --  6.5  --   ALBUMIN 2.9* 3.0* 2.6*   No results for input(s): "LIPASE", "AMYLASE" in the last 168 hours. No results for input(s): "AMMONIA" in the last 168 hours.  ABG    Component Value Date/Time   PHART 7.515 (H) 11/25/2023 1105   PCO2ART 29.8 (L) 11/25/2023 1105   PO2ART 147 (H) 11/25/2023 1105   HCO3 24.0 11/25/2023 1105   TCO2 25 11/25/2023 1105   ACIDBASEDEF 2.0 05/31/2022 1049   O2SAT 100 11/25/2023 1105     Coagulation Profile: No results for input(s): "INR", "PROTIME" in the last 168 hours.  Cardiac Enzymes: No results for input(s): "CKTOTAL", "CKMB", "CKMBINDEX", "TROPONINI" in the last 168 hours.  HbA1C: Hemoglobin A1C  Date/Time Value Ref Range Status  12/22/2020 12:00 AM 8.6  Final  06/19/2020 12:00 AM 9.6  Final   Hgb A1c MFr Bld  Date/Time Value Ref Range Status  10/26/2023 11:30 AM 5.8 (H) 4.8 - 5.6 % Final    Comment:     (NOTE)         Prediabetes: 5.7 - 6.4         Diabetes: >6.4         Glycemic control for adults with diabetes: <7.0   02/10/2023 04:52 AM 6.7 (H) 4.8 - 5.6 % Final    Comment:    (NOTE) Pre diabetes:          5.7%-6.4%  Diabetes:              >6.4%  Glycemic control for   <7.0% adults with diabetes     CBG: Recent Labs  Lab 11/26/23 1522 11/26/23 1930 11/26/23 2027 11/27/23 0022 11/27/23 0325  GLUCAP 112* 240* 245* 137* 137*    Review of Systems:   Unable to obtain  Past Medical History:  She,  has a past medical history of Acute GI bleeding, Allergy, Anemia, Anterior chest wall pain, Appendicitis (1965), Asthma, Body mass index 37.0-37.9, adult, Breast pain, Cataract, CHF (congestive heart failure) (HCC), Cognitive change (04/20/2021), Complication of anesthesia, Dehydration (2014), Deviated septum (1971), Diabetes mellitus, Dysphagia due to old stroke, Dyspnea (2014), ESRD on hemodialysis (HCC), Extrinsic asthma, Fibroid (1980), GERD (gastroesophageal reflux disease), Heart murmur, History of migraine, History of seizure, gestational diabetes, Hyperlipidemia, Hypertension (2014), Inguinal hernia (1959), Malaise and fatigue (2014), Non-IgE mediated allergic asthma (2014), Obesity, Pelvic pain, Pregnancy, high-risk (1985), Stroke (HCC) (04/20/2021), Tonsillitis (1968), Uterine fibroid (1980), and Visual field defect.   Surgical History:   Past Surgical History:  Procedure Laterality Date   ABDOMINAL AORTOGRAM W/LOWER EXTREMITY N/A 02/21/2022   Procedure: ABDOMINAL AORTOGRAM W/LOWER EXTREMITY;  Surgeon: Maeola Harman, MD;  Location: [facility] INVASIVE CV LAB;  Service: Cardiovascular;  Laterality: N/A;   AMPUTATION Right 05/18/2022   Procedure: RIGHT BELOW KNEE AMPUTATION;  Surgeon: Nadara Mustard, MD;  Location: [facility] OR;  Service: Orthopedics;  Laterality: Right;   AMPUTATION TOE Right 04/15/2022   Procedure: AMPUTATION  LST TOERIGHT FOOT;  Surgeon: Edwin Cap, DPM;   Location: [facility] ORS;  Service: Podiatry;  Laterality: Right;   APPENDECTOMY  1959   BASCILIC VEIN TRANSPOSITION Right 04/30/2021   Procedure: RIGHT FIRST STAGE BASCILIC VEIN TRANSPOSITION;  Surgeon: Chuck Hint, MD;  Location[facility] OR;  Service: Vascular;  Laterality: Right;   CESAREAN SECTION  1985   COLONOSCOPY     ESOPHAGOGASTRODUODENOSCOPY (EGD) WITH PROPOFOL N/A 04/18/2021   Procedure: ESOPHAGOGASTRODUODENOSCOPY (EGD) WITH PROPOFOL;  Surgeon: Iva Boop, MD;  Location: [facility] ENDOSCOPY;  Service: Endoscopy;  Laterality: N/A;   EYE SURGERY     bilateral cataract    FLEXIBLE SIGMOIDOSCOPY N/A 04/18/2021   Procedure: FLEXIBLE SIGMOIDOSCOPY;  Surgeon: Iva Boop, MD;  Location: [facility] ENDOSCOPY;  Service: Endoscopy;  Laterality: N/A;   HERNIA REPAIR  1959   IR FLUORO GUIDE CV LINE RIGHT  03/18/2021   IR US GUIDE VASC ACCESS RIGHT  03/18/2021   LEFT HEART CATH AND CORONARY ANGIOGRAPHY N/A 04/04/2017   Procedure: Left Heart Cath and Coronary Angiography;  Surgeon: Lyn Records, MD;  Location: [facility] INVASIVE CV LAB;  Service: Cardiovascular;  Laterality: N/A;   MYOMECTOMY  1980, 2004, 2007   PERIPHERAL VASCULAR THROMBECTOMY  02/21/2022   Procedure: PERIPHERAL VASCULAR THROMBECTOMY;  Surgeon: Maeola Harman, MD;  Location: [facility] INVASIVE CV LAB;  Service: Cardiovascular;;  Right Tibial   RHINOPLASTY  1971   ROTATOR CUFF REPAIR  2003   SURGICAL REPAIR OF HEMORRHAGE  2015   TONSILLECTOMY  1968     Social History:   reports that she has never smoked. She has never used smokeless tobacco. She reports that she does not drink alcohol and does not use drugs.   Family History:  Her family history includes Alzheimer's disease in her father; Colon cancer in her cousin; Diabetes in her cousin and maternal aunt; Melanoma in her mother; Psoriasis in her mother. There is no history of Colon polyps, Esophageal cancer, Cancer - Colon, Liver disease, BRCA 1/2, or Breast cancer.    Allergies Allergies  Allergen Reactions   Almond Oil Anaphylaxis   Food Anaphylaxis    Peanuts - anaphylaxis     Gadolinium Derivatives Other (See Comments)    Gadolinium-Containing Contrast Media    Januvia [Sitagliptin] Other (See Comments)   Pork-Derived Products Other (See Comments)    Does not eat pork       Home Medications  Prior to Admission medications   Medication Sig Start Date End Date Taking? Authorizing Provider  acetaminophen (TYLENOL) 500 MG tablet Take 1,000 mg by mouth 2 (two) times daily as needed (leg and arm pain).   Yes [provider]  acetaminophen (TYLENOL) 650 MG CR tablet Take 650 mg by mouth 2 (two) times daily.   Yes [provider]  albuterol (PROVENTIL) (2.5 MG/3ML) 0.083% nebulizer solution Use 1 vial (2.5 mg total) by nebulization every 4 (four) hours as needed for wheezing or shortness of breath. 02/14/23 02/14/24 Yes Vann, Jessica U, DO  Amino Acids-Protein Hydrolys (PRO-STAT SUGAR FREE PO) Take 30 mLs by mouth 2 (two) times daily.   Yes [provider]  amitriptyline (ELAVIL) 25 MG tablet Take 0.5-1 tablets (12.5-25 mg total) by mouth at bedtime.  Patient taking differently: Take 12.5 mg by mouth at bedtime. 09/11/23  Yes Lovorn, Megan, MD  aspirin 81 MG chewable tablet Chew 81 mg by mouth daily.   Yes [provider]  calcitRIOL (ROCALTROL) 0.5 MCG capsule Take 0.5 mcg by mouth See admin instructions. Only takes on Dialysis days Monday,Wednesday,Friday 07/10/23  Yes [provider]  cinacalcet (SENSIPAR) 30 MG tablet Take 30 mg by mouth daily.   Yes [provider]  cyclobenzaprine (FLEXERIL) 5 MG tablet Take 1 tablet (5 mg total) by mouth 3 (three) times daily as needed for muscle spasms. 11/01/23  Yes Hughie Closs, MD  diclofenac Sodium (VOLTAREN) 1 % GEL Apply 2 g topically 4 (four) times daily. To left wrist 05/26/21  Yes Love, Evlyn Kanner, PA-C  Glucagon, rDNA, (GLUCAGON EMERGENCY) 1 MG KIT Inject  1 mg into the vein as needed (CBG of 65mg /dL).   Yes [provider]  hydrALAZINE (APRESOLINE) 100 MG tablet Take 1 tablet (100 mg total) by mouth 3 (three) times daily. 11/01/23 12/01/23 Yes Pahwani, Daleen Bo, MD  insulin aspart (NOVOLOG) 100 UNIT/ML injection Inject 0-6 Units into the skin 3 (three) times daily with meals. CBG < 70: Implement Hypoglycemia Standing Orders and refer to Hypoglycemia Standing Orders sidebar report CBG 70 - 120: 0 units CBG 121 - 150: 0 units CBG 151 - 200: 1 unit CBG 201-250: 2 units CBG 251-300: 3 units CBG 301-350: 4 units CBG 351-400: 5 units CBG > 400: Give 6 units and call MD 06/29/22  Yes Pokhrel, Rebekah Chesterfield, MD  levETIRAcetam (KEPPRA) 250 MG tablet Take 1.5 tablets (375 mg total) by mouth every Monday, Wednesday, and Friday at 6 PM. 06/29/22  Yes Pokhrel, Laxman, MD  levETIRAcetam (KEPPRA) 750 MG tablet Take 1 tablet (750 mg total) by mouth at bedtime. 06/29/22  Yes Pokhrel, Laxman, MD  loratadine (CLARITIN) 10 MG tablet Take 10 mg by mouth daily.   Yes [provider]  losartan (COZAAR) 50 MG tablet Take 1 tablet (50 mg total) by mouth 2 (two) times daily. 11/01/23 12/01/23 Yes Pahwani, Daleen Bo, MD  melatonin 5 MG TABS Take 5 mg by mouth at bedtime.   Yes [provider]  mometasone-formoterol (DULERA) 200-5 MCG/ACT AERO Inhale 2 puffs into the lungs 2 (two) times daily. 04/15/22  Yes Dorothyann Peng, MD  pantoprazole (PROTONIX) 40 MG tablet Take 1 tablet (40 mg total) by mouth daily. Patient taking differently: Take 40 mg by mouth at bedtime. 02/07/22  Yes Dorothyann Peng, MD  prochlorperazine (COMPAZINE) 10 MG tablet Take 1 tablet (10 mg total) by mouth every 8 (eight) hours as needed for nausea or vomiting. 02/15/23  Yes Danis, Starr Lake III, MD  rosuvastatin (CRESTOR) 20 MG tablet Take 1 tablet (20 mg total) by mouth daily. Patient taking differently: Take 20 mg by mouth at bedtime. 08/18/21  Yes Ghumman, Ramandeep, NP  sevelamer carbonate (RENVELA) 0.8 g  PACK packet Take 0.8 g by mouth 3 (three) times daily. 10/19/21  Yes [provider]  ticagrelor (BRILINTA) 90 MG TABS tablet Take 1 tablet (90 mg total) by mouth 2 (two) times daily. 06/10/22  Yes Willeen Niece, MD  aspirin EC 81 MG tablet Take 1 tablet (81 mg total) by mouth daily. Swallow whole. Patient not taking: Reported on 11/17/2023 06/10/22   Willeen Niece, MD  gabapentin (NEURONTIN) 300 MG capsule Take 1 capsule (300 mg total) by mouth at bedtime. Patient not taking: Reported on 11/17/2023 09/11/23   Genice Rouge, MD  hydrocortisone (ANUSOL-HC)  2.5 % rectal cream Place rectally 4 (four) times daily. Patient not taking: Reported on 11/17/2023 02/14/23   Joseph Art, DO  Iron Sucrose (VENOFER IV) Inject 50 mg into the skin once a week. 08/16/23 10/18/24  [provider]  Methoxy PEG-Epoetin Beta (MIRCERA IJ) Inject 1 Dose into the skin every 14 (fourteen) days. 04/19/23 08/28/24  [provider]  methylphenidate (RITALIN) 5 MG tablet Take 1 tablet (5 mg total) by mouth 2 (two) times daily with breakfast and lunch. Patient not taking: Reported on 11/17/2023 09/11/23   Genice Rouge, MD     Critical care time: 35 min

## 2023-11-27 NOTE — Progress Notes (Signed)
Nutrition Follow-up  DOCUMENTATION CODES:   Not applicable  INTERVENTION:  Continue TF via OGT: Vital 1.5 at 56ml/hr ( per day) Provides 1620 kcal, 73g protein and per day  Once extubated and diet advanced, recommend: Continuing dysphagia 2 diet order per SLP Ensure Enlive po BID, each supplement provides 350 kcal and 20 grams of protein. Renal MVI with minerals daily   NUTRITION DIAGNOSIS:   Inadequate oral intake related to acute illness as evidenced by NPO status. - remains applicable  GOAL:   Patient will meet greater than or equal to 90% of their needs - goal met via TF  MONITOR:   Vent status, Labs, Weight trends, TF tolerance, I & O's  REASON FOR ASSESSMENT:   Ventilator    ASSESSMENT:   Pt  presented 1/23 with confusion and worsening L chest pain secondary to COVID and RSV infection. PMH significant for ESRD on HD, CHF, CVA with residual L sided weakness, T2DM, R BKA, seizure disorder.  1/23: MRI brain- revealed 1.3 cm acute to early subacute R PICA distribution infarct 1/28: MBS recommend dysphagia 2, thin liquids 1/30: PEA arrest; intubated; NPO  2/1: failed SBT d/t apnea 2/2: tolerating SBT, remains encephalopathic  Patient remains intubated on ventilator support. Extubation precluded by mental status.  MV: 8.7 L/min Temp (24hrs), Avg:99.6 F (37.6 C), Min:98.7 F (37.1 C), Max:100.1 F (37.8 C)  Admit weight: 79.5 kg  Current weight: 69.7 kg EDW: 75.5 kg  Next HD today. Nephrology notes pt to be euvolemic on exam.  Last HD 2/2. Net UF 1.3L  TF continues to infuse at goal rate via OGT. Tolerating well without n/v/diarrhea.  Per RN last BM was Friday. Abdomen soft and non-distended. Providing bowel regimen.    Intake/Output Summary (Last 24 hours) at 11/27/2023 1545 Last data filed at 11/27/2023 1500 Gross per 24 hour  Intake 1562.1 ml  Output --  Net 1562.1 ml   Net IO Since Admission: -2,109.82 mL [11/27/23  1545]  Drains/Lines: OGT (distal stomach) RUE fistula  Medications: colace BID, pepcid, SSI 0-15 units q4h, rena-vit, miralax, renvela Drips: levo stopped at 1315 today  Labs Reviewed: Sodium 133 BUN 26 Cr 5.00 GFR 9 CBG ranges from 83-245 mg/dL over the last 24 hours  Diet Order:   Diet Order             Diet NPO time specified  Diet effective now                   EDUCATION NEEDS:   No education needs have been identified at this time  Skin:  Skin Assessment: Reviewed RN Assessment  Last BM:  1/31  Height:   Ht Readings from Last 1 Encounters:  11/26/23 5\' 3"  (1.6 m)    Weight:   Wt Readings from Last 1 Encounters:  11/27/23 69.7 kg   BMI:  Body mass index is 27.22 kg/m.  Estimated Nutritional Needs:   Kcal:  1500-1700  Protein:  75-90g  Fluid:  1L + UOP  Drusilla Kanner, RDN, LDN Clinical Nutrition

## 2023-11-28 ENCOUNTER — Inpatient Hospital Stay (HOSPITAL_COMMUNITY): Payer: Medicare PPO

## 2023-11-28 DIAGNOSIS — I639 Cerebral infarction, unspecified: Secondary | ICD-10-CM | POA: Diagnosis not present

## 2023-11-28 DIAGNOSIS — N179 Acute kidney failure, unspecified: Secondary | ICD-10-CM | POA: Diagnosis not present

## 2023-11-28 DIAGNOSIS — I469 Cardiac arrest, cause unspecified: Secondary | ICD-10-CM

## 2023-11-28 DIAGNOSIS — G9340 Encephalopathy, unspecified: Secondary | ICD-10-CM | POA: Diagnosis not present

## 2023-11-28 DIAGNOSIS — I10 Essential (primary) hypertension: Secondary | ICD-10-CM | POA: Diagnosis not present

## 2023-11-28 LAB — PHOSPHORUS: Phosphorus: 2.3 mg/dL — ABNORMAL LOW (ref 2.5–4.6)

## 2023-11-28 LAB — CBC
HCT: 26.4 % — ABNORMAL LOW (ref 36.0–46.0)
Hemoglobin: 9.2 g/dL — ABNORMAL LOW (ref 12.0–15.0)
MCH: 29.9 pg (ref 26.0–34.0)
MCHC: 34.8 g/dL (ref 30.0–36.0)
MCV: 85.7 fL (ref 80.0–100.0)
Platelets: 268 10*3/uL (ref 150–400)
RBC: 3.08 MIL/uL — ABNORMAL LOW (ref 3.87–5.11)
RDW: 18 % — ABNORMAL HIGH (ref 11.5–15.5)
WBC: 21 10*3/uL — ABNORMAL HIGH (ref 4.0–10.5)
nRBC: 0 % (ref 0.0–0.2)

## 2023-11-28 LAB — CULTURE, BLOOD (ROUTINE X 2)
Culture: NO GROWTH
Culture: NO GROWTH

## 2023-11-28 LAB — BASIC METABOLIC PANEL
Anion gap: 15 (ref 5–15)
BUN: 36 mg/dL — ABNORMAL HIGH (ref 8–23)
CO2: 25 mmol/L (ref 22–32)
Calcium: 8.4 mg/dL — ABNORMAL LOW (ref 8.9–10.3)
Chloride: 94 mmol/L — ABNORMAL LOW (ref 98–111)
Creatinine, Ser: 6.19 mg/dL — ABNORMAL HIGH (ref 0.44–1.00)
GFR, Estimated: 7 mL/min — ABNORMAL LOW (ref >60)
Glucose, Bld: 180 mg/dL — ABNORMAL HIGH (ref 70–99)
Potassium: 4.8 mmol/L (ref 3.5–5.1)
Sodium: 134 mmol/L — ABNORMAL LOW (ref 135–145)

## 2023-11-28 LAB — ECHOCARDIOGRAM LIMITED
Height: 63 in
S' Lateral: 2.9 cm
Weight: 2469.15 [oz_av]

## 2023-11-28 LAB — GLUCOSE, CAPILLARY
Glucose-Capillary: 108 mg/dL — ABNORMAL HIGH (ref 70–99)
Glucose-Capillary: 108 mg/dL — ABNORMAL HIGH (ref 70–99)
Glucose-Capillary: 130 mg/dL — ABNORMAL HIGH (ref 70–99)
Glucose-Capillary: 139 mg/dL — ABNORMAL HIGH (ref 70–99)
Glucose-Capillary: 222 mg/dL — ABNORMAL HIGH (ref 70–99)
Glucose-Capillary: 96 mg/dL (ref 70–99)

## 2023-11-28 LAB — MAGNESIUM: Magnesium: 2.4 mg/dL (ref 1.7–2.4)

## 2023-11-28 MED ORDER — POLYETHYLENE GLYCOL 3350 17 G PO PACK
17.0000 g | PACK | Freq: Two times a day (BID) | ORAL | Status: DC
  Administered 2023-11-28 – 2023-11-30 (×4): 17 g
  Filled 2023-11-28 (×3): qty 1

## 2023-11-28 MED ORDER — SODIUM CHLORIDE 0.9 % IV SOLN: 1.0000 g | INTRAVENOUS | Status: DC

## 2023-11-28 MED ORDER — FENTANYL BOLUS VIA INFUSION
30.0000 ug | INTRAVENOUS | Status: DC | PRN
  Administered 2023-11-28 (×4): 30 ug via INTRAVENOUS

## 2023-11-28 MED ORDER — FENTANYL BOLUS VIA INFUSION: 25.0000 ug | INTRAVENOUS | Status: DC | PRN

## 2023-11-28 MED ORDER — FENTANYL CITRATE PF 50 MCG/ML IJ SOSY
25.0000 ug | PREFILLED_SYRINGE | INTRAMUSCULAR | Status: DC | PRN
  Administered 2023-11-28 – 2023-11-29 (×4): 50 ug via INTRAVENOUS
  Administered 2023-11-30: 100 ug via INTRAVENOUS
  Administered 2023-11-30: 50 ug via INTRAVENOUS
  Administered 2023-12-01: 100 ug via INTRAVENOUS
  Administered 2023-12-01: 50 ug via INTRAVENOUS
  Administered 2023-12-02 – 2023-12-03 (×4): 100 ug via INTRAVENOUS
  Filled 2023-11-28: qty 1
  Filled 2023-11-28: qty 2
  Filled 2023-11-28 (×5): qty 1
  Filled 2023-11-28 (×4): qty 2
  Filled 2023-11-28: qty 1
  Filled 2023-11-28 (×3): qty 2

## 2023-11-28 MED ORDER — SENNOSIDES-DOCUSATE SODIUM 8.6-50 MG PO TABS
1.0000 | ORAL_TABLET | Freq: Two times a day (BID) | ORAL | Status: DC
  Administered 2023-11-28 – 2023-11-30 (×5): 1
  Filled 2023-11-28 (×5): qty 1

## 2023-11-28 MED ORDER — PROPOFOL 1000 MG/100ML IV EMUL
5.0000 ug/kg/min | INTRAVENOUS | Status: DC
  Administered 2023-11-28: 25 ug/kg/min via INTRAVENOUS
  Administered 2023-11-28: 5 ug/kg/min via INTRAVENOUS
  Administered 2023-11-29: 45 ug/kg/min via INTRAVENOUS
  Administered 2023-11-29: 25 ug/kg/min via INTRAVENOUS
  Filled 2023-11-28 (×4): qty 100

## 2023-11-28 NOTE — Progress Notes (Signed)
Pt transported to and from CT without event.  

## 2023-11-28 NOTE — Progress Notes (Signed)
 Patient continues to have coughing episodes throughout the night and appears extremely uncomfortable. PRN cough medication not effective. Pt had an episode of emesis due to strong cough. Tube feed stopped and OG hooked to LIS. No PRN pain medication ordered per family request, but patient is clearly extremely uncomfortable and has not rested at all throughout the night. [facility] notified and 30mcg of fentanyl  ordered.

## 2023-11-28 NOTE — Progress Notes (Signed)
RT NOTE: patient placed on CPAP/PSV of 12/5 at 0828.  Currently tolerating well.  Will continue to monitor and wean as tolerated.

## 2023-11-28 NOTE — Progress Notes (Signed)
ETT taped changed without any complications with assistance of second RT.  No complications.  Patient tolerated well.

## 2023-11-28 NOTE — Progress Notes (Addendum)
Verbal order per CCM MD to keep patient on Propofol through today in order for patient to be able to tolerate MRI scan. Awaiting MRI orders & Neurology consult this afternoon.

## 2023-11-28 NOTE — Progress Notes (Signed)
 Pt had hypotension at last 5 mins. Pt goal met.  11/28/23 1410  Vitals  Temp 98.2 F (36.8 C)  Temp Source Axillary  BP (!) 123/37  BP Location Other (Comment) (AL)  Resp 20  During Treatment Monitoring  Intra-Hemodialysis Comments Tx completed  Post Treatment  Dialyzer Clearance Lightly streaked  Hemodialysis Intake (mL) 0 mL  Liters Processed 68  Fluid Removed (mL) 1000 mL  Tolerated HD Treatment Yes  Post-Hemodialysis Comments Pt goal met.  Hemodialysis Catheter Right Subclavian  No placement date or time found.   Orientation: Right  Access Location: Subclavian  Site Condition No complications  Blue Lumen Status Heparin  locked  Red Lumen Status Heparin  locked  Catheter fill solution Heparin  1000 units/ml  Catheter fill volume (Arterial) 1.6 cc  Catheter fill volume (Venous) 1.6  Dressing Type Gauze/Drain sponge;Transparent  Dressing Status Antimicrobial disc/dressing in place;Clean, Dry, Intact  Interventions New dressing  Drainage Description None  Dressing Change Due 11/30/23  Post treatment catheter status Capped and Clamped

## 2023-11-28 NOTE — Progress Notes (Signed)
 Nephrology Follow-Up Consult note   Assessment/Recommendations: Carolyn Russo is a/an 73 y.o. female with a past medical history significant for ESRD, admitted for AMS w/ AHRF, CAP, COVID, RSV.       Dialysis Orders: MWF NW  4h   B350    75.5kg   2/2 bath   R fem TDC  Heparin  2600 + 1400 midrun - last HD 1/22, getting to dry wt - 3-4 drops in dry wt in last 3 wks - venofer  100 qhd thru 1/31 - mircera 150 q2, last 1/17, due 1/31    CXR 2/04 - no edema   Assessment/Plan: AHRF/ CAP -  +COVID and RSV. Some volume excess during the hospitalization. Abx per primary. UF w/ HD. Now on ventilator after cardiac arrest.  Vent weaning as able CVA - R PICA area, mgmt per neurology.  AMS - multifactorial ESRD - on HD MWF.  HD was postponed to this am. Next HD Thursday off schedule.   Shock/ hypotension - on levo support 1-2 micrograms/min Volume - down 5kg from dry wt, 1 L off w/ hd today. CXR 2/04 no edema. Keep even w/ next HD.  Anemia of esrd - Hfb at goal. ESA due on 1/31 but held. Redose if needed Secondary hyperparathyroidism - Cont home binders when taking po H/o seizure d/o - on Keppra  PEA arrest- mgmt per ccm.   GOC: chronically ill now w/ AMS, hypoxia and cardiac arrest.  Family wishes to continue with course of care for now    Carolyn Russo [facility] 11/28/2023 2:26 PM  ___________________________________________________________  Interval History/Subjective: on vent and sedated    Physical Exam: Vitals:   11/28/23 1410 11/28/23 1415  BP: (!) 123/37   Pulse:  77  Resp: 20 19  Temp: 98.2 F (36.8 C)   SpO2:  100%   GEN: Chronically ill-appearing, lying in bed, no distress ENT: no nasal discharge, mmm, ET tube in place EYES: no scleral icterus, eyes closed CV: normal rate, no rub PULM: Bilateral chest rise, ventilated ABD: NABS, non-distended SKIN: no rashes or jaundice EXT: minimal edema, warm and well perfused    Recent Labs  Lab  11/24/23 0434 11/25/23 0320 11/26/23 0200 11/26/23 1839 11/27/23 0516 11/28/23 0442  HGB 11.1*   < > 10.5*  --  9.5* 9.2*  ALBUMIN  3.0*  --  2.6*  --   --   --   CALCIUM  8.8*   < > 8.6*  --  8.4* 8.4*  PHOS  --    < > 4.0   < > 2.6 2.3*  CREATININE 5.52*   < > 7.45*  --  5.00* 6.19*  K 3.4*   < > 4.4  --  4.1 4.8   < > = values in this interval not displayed.    Inpatient medications:   stroke: early stages of recovery book   Does not apply Once   aspirin   81 mg Per Tube Daily   Chlorhexidine  Gluconate Cloth  6 each Topical Q0600   famotidine   10 mg Per Tube Daily   heparin  injection (subcutaneous)  5,000 Units Subcutaneous Q8H   insulin  aspart  0-15 Units Subcutaneous Q4H   lidocaine   1 patch Transdermal Q24H   midodrine   5 mg Per Tube Q8H   multivitamin  1 tablet Per Tube QHS   mouth rinse  15 mL Mouth Rinse Q2H   polyethylene glycol  17 g Per Tube BID   QUEtiapine   12.5 mg Per  Tube BID   senna-docusate  1 tablet Per Tube BID   sevelamer  carbonate  800 mg Per Tube TID WC   sodium chloride  flush  3 mL Intravenous Q12H   ticagrelor   90 mg Per Tube BID    feeding supplement (VITAL 1.5 CAL) 20 mL/hr at 11/28/23 1400   levETIRAcetam  Stopped (11/28/23 1009)   norepinephrine  (LEVOPHED ) Adult infusion 1 mcg/min (11/28/23 1400)   propofol  (DIPRIVAN ) infusion 25 mcg/kg/min (11/28/23 1400)   acetaminophen  **OR** acetaminophen , bisacodyl , chlorpheniramine-HYDROcodone , fentaNYL  (SUBLIMAZE ) injection, hydrALAZINE , labetalol , ondansetron  (ZOFRAN ) IV, mouth rinse, SMOG, witch hazel-glycerin 

## 2023-11-28 NOTE — Consult Note (Addendum)
 NEUROLOGY CONSULT NOTE   Date of service: November 28, 2023 Patient Name: Carolyn Russo MRN:  [id number] DOB:  03-30-1951 Chief Complaint: altered mental status Requesting Provider: Kassie Acquanetta Bradley, MD  History of Present Illness  Carolyn Russo is a 73 y.o. female with PMH significant for CVA with residual left-sided weakness, HD MWF, CHF, DM2, R BKA, Seizures 9on Keppra ) who presented 1/23 as a CODE STROKE d/t confusion, left side chest pain, SOB. MRI revealed a 1.3 cm acute to early subacute right PICA infarct. Also diagnosed with pneumonia due to COVID and RSV and was started on antibiotics for likely superimposed bacterial infection. She was continued on Aspirin  and Brilinta  and Stroke Team signed off on 1/27.   1/30: Patient was around 2 hours into dialysis when BP dropped, rapid response was activated and dialysis was stopped. Around 0630, CODE BLUE was called after patient was noted to be apneic without pulse. Patient was in PEA arrest. ROSC achieved after a few minutes of CPR. Transferred to 26M and intubated due to failure to protect airway. Reported to be following commands after code. However, starting yesterday, patient has had increased agitation and is now not following commands. Neurology consulted for change in mental status.   On exam, patient is sedated on low-dose propofol . Does not open eyes to voice or follow commands. She does not withdraw to noxious stimuli on RUE, RLE. Slight withdraw on LUE with anti-gravity withdraw on LLE.   Husband at bedside. Plan of care discussed and all questions answered.     ROS   Unable to ascertain due to sedation and intubation  Past History   Past Medical History:  Diagnosis Date   Acute GI bleeding    Allergy    Anemia    Anterior chest wall pain    Appendicitis 1965   Asthma    Body mass index 37.0-37.9, adult    Breast pain    Cataract    both eyes   CHF (congestive heart failure) (HCC)    Cognitive change 04/20/2021    r/t cva 03/2821   Complication of anesthesia    Memory loss after general   Dehydration 2014   Deviated septum 1971   Diabetes mellitus    Dysphagia due to old stroke    easy to get strangled when eating   Dyspnea 2014   ESRD on hemodialysis (HCC)    MWF at [facility]   Extrinsic asthma    WITH ASTHMA ATTACK   Fibroid 1980   GERD (gastroesophageal reflux disease)    Heart murmur    History of migraine    History of seizure    with stroke   Hx gestational diabetes    Hyperlipidemia    Hypertension 2014   Inguinal hernia 1959   Malaise and fatigue 2014   Non-IgE mediated allergic asthma 2014   Obesity    Pelvic pain    Pregnancy, high-risk 1985   Stroke (HCC) 04/20/2021   (CVA) of right basal ganglia   Tonsillitis 1968   Uterine fibroid 1980   Visual field defect    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    Past Surgical History:  Procedure Laterality Date   ABDOMINAL AORTOGRAM W/LOWER EXTREMITY N/A 02/21/2022   Procedure: ABDOMINAL AORTOGRAM W/LOWER EXTREMITY;  Surgeon: Sheree Penne Bruckner, MD;  Location: [facility] INVASIVE CV LAB;  Service: Cardiovascular;  Laterality: N/A;   AMPUTATION Right 05/18/2022   Procedure: RIGHT BELOW KNEE AMPUTATION;  Surgeon: Harden Jerona GAILS, MD;  Location: [facility] OR;  Service: Orthopedics;  Laterality: Right;   AMPUTATION TOE Right 04/15/2022   Procedure: AMPUTATION  LST TOERIGHT FOOT;  Surgeon: Silva Juliene SAUNDERS, DPM;  Location: [facility] ORS;  Service: Podiatry;  Laterality: Right;   APPENDECTOMY  1959   BASCILIC VEIN TRANSPOSITION Right 04/30/2021   Procedure: RIGHT FIRST STAGE BASCILIC VEIN TRANSPOSITION;  Surgeon: Eliza Lonni RAMAN, MD;  Location[facility] OR;  Service: Vascular;  Laterality: Right;   CESAREAN SECTION  1985   COLONOSCOPY     ESOPHAGOGASTRODUODENOSCOPY (EGD) WITH PROPOFOL  N/A 04/18/2021   Procedure: ESOPHAGOGASTRODUODENOSCOPY (EGD) WITH PROPOFOL ;  Surgeon: Avram Lupita BRAVO, MD;  Location: [facility] ENDOSCOPY;  Service: Endoscopy;  Laterality: N/A;   EYE SURGERY      bilateral cataract    FLEXIBLE SIGMOIDOSCOPY N/A 04/18/2021   Procedure: FLEXIBLE SIGMOIDOSCOPY;  Surgeon: Avram Lupita BRAVO, MD;  Location: [facility] ENDOSCOPY;  Service: Endoscopy;  Laterality: N/A;   HERNIA REPAIR  1959   IR FLUORO GUIDE CV LINE RIGHT  03/18/2021   IR US  GUIDE VASC ACCESS RIGHT  03/18/2021   LEFT HEART CATH AND CORONARY ANGIOGRAPHY N/A 04/04/2017   Procedure: Left Heart Cath and Coronary Angiography;  Surgeon: Claudene Victory ORN, MD;  Location: [facility] INVASIVE CV LAB;  Service: Cardiovascular;  Laterality: N/A;   MYOMECTOMY  1980, 2004, 2007   PERIPHERAL VASCULAR THROMBECTOMY  02/21/2022   Procedure: PERIPHERAL VASCULAR THROMBECTOMY;  Surgeon: Sheree Penne Lonni, MD;  Location: [facility] INVASIVE CV LAB;  Service: Cardiovascular;;  Right Tibial   RHINOPLASTY  1971   ROTATOR CUFF REPAIR  2003   SURGICAL REPAIR OF HEMORRHAGE  2015   TONSILLECTOMY  1968    Family History: Family History  Problem Relation Age of Onset   Psoriasis Mother    Melanoma Mother        abdominal   Alzheimer's disease Father    Diabetes Maternal Aunt    Colon cancer Cousin    Diabetes Cousin    Colon polyps Neg Hx    Esophageal cancer Neg Hx    Cancer - Colon Neg Hx    Liver disease Neg Hx    BRCA 1/2 Neg Hx    Breast cancer Neg Hx     Social History  reports that she has never smoked. She has never used smokeless tobacco. She reports that she does not drink alcohol and does not use drugs.  Allergies  Allergen Reactions   Almond Oil Anaphylaxis   Food Anaphylaxis    Peanuts - anaphylaxis     Gadolinium Derivatives Other (See Comments)    Gadolinium-Containing Contrast Media    Januvia  [Sitagliptin ] Other (See Comments)   Pork-Derived Products Other (See Comments)    Does not eat pork      Medications   Current Facility-Administered Medications:     stroke: early stages of recovery book, , Does not apply, Once, Moody Alto, MD   acetaminophen  (TYLENOL ) tablet 650 mg, 650 mg, Per Tube, Q6H  PRN **OR** acetaminophen  (TYLENOL ) suppository 650 mg, 650 mg, Rectal, Q6H PRN, Kassie Acquanetta Bradley, MD   aspirin  chewable tablet 81 mg, 81 mg, Per Tube, Daily, Atway, Rayann N, DO, 81 mg at 11/28/23 0957   bisacodyl  (DULCOLAX) suppository 10 mg, 10 mg, Rectal, Daily PRN, Metzli Bellis, MD   Chlorhexidine  Gluconate Cloth 2 % PADS 6 each, 6 each, Topical, Q0600, Malillany Bellis, MD, 6 each at 11/28/23 1028   chlorpheniramine-HYDROcodone  (TUSSIONEX) 10-8 MG/5ML suspension 5 mL, 5 mL, Per Tube, Q12H PRN, Kassie, Chi  Slater, MD   famotidine  (PEPCID ) tablet 10 mg, 10 mg, Per Tube, Daily, Kassie Acquanetta Slater, MD, 10 mg at 11/28/23 0957   feeding supplement (VITAL 1.5 CAL) liquid 1,000 mL, 1,000 mL, Per Tube, Continuous, Hunsucker, Donnice SAUNDERS, MD, Last Rate: 20 mL/hr at 11/28/23 1400, Infusion Verify at 11/28/23 1400   fentaNYL  (SUBLIMAZE ) injection 25-100 mcg, 25-100 mcg, Intravenous, Q30 min PRN, Kassie Acquanetta Slater, MD, 50 mcg at 11/28/23 0915   heparin  injection 5,000 Units, 5,000 Units, Subcutaneous, Q8H, Ida Bellis, MD, 5,000 Units at 11/28/23 1351   hydrALAZINE  (APRESOLINE ) injection 10 mg, 10 mg, Intravenous, Q4H PRN, Goel, Hersh, MD, 10 mg at 11/25/23 1609   insulin  aspart (novoLOG ) injection 0-15 Units, 0-15 Units, Subcutaneous, Q4H, Atway, Rayann N, DO, 5 Units at 11/28/23 9570   labetalol  (NORMODYNE ) injection 20 mg, 20 mg, Intravenous, Q2H PRN, Goel, Hersh, MD, 20 mg at 11/19/23 9371   levETIRAcetam  (KEPPRA ) IVPB 500 mg/100 mL premix, 500 mg, Intravenous, BID, Hunsucker, Donnice SAUNDERS, MD, Stopped at 11/28/23 1009   lidocaine  (LIDODERM ) 5 % 1 patch, 1 patch, Transdermal, Q24H, Rosario Eland I, MD, 1 patch at 11/27/23 2149   midodrine  (PROAMATINE ) tablet 5 mg, 5 mg, Per Tube, Q8H, Atway, Rayann N, DO, 5 mg at 11/28/23 1351   multivitamin (RENA-VIT) tablet 1 tablet, 1 tablet, Per Tube, QHS, Hunsucker, Donnice SAUNDERS, MD, 1 tablet at 11/27/23 2152   norepinephrine  (LEVOPHED ) 4mg  in (0.016 mg/mL) premix  infusion, 0-40 mcg/min, Intravenous, Titrated, Kassie Acquanetta Slater, MD, Last Rate: 3.75 mL/hr at 11/28/23 1400, 1 mcg/min at 11/28/23 1400   ondansetron  (ZOFRAN ) injection 4 mg, 4 mg, Intravenous, Q6H PRN, Hunsucker, Donnice SAUNDERS, MD   Oral care mouth rinse, 15 mL, Mouth Rinse, Q2H, Autry, Lauren E, PA-C, 15 mL at 11/28/23 1520   Oral care mouth rinse, 15 mL, Mouth Rinse, PRN, Adolph, Lauren E, PA-C   polyethylene glycol (MIRALAX  / GLYCOLAX ) packet 17 g, 17 g, Per Tube, BID, Atway, Rayann N, DO, 17 g at 11/28/23 0956   propofol  (DIPRIVAN ) 1000 MG/100ML infusion, 5-80 mcg/kg/min, Intravenous, Titrated, Atway, Rayann N, DO, Last Rate: 10.65 mL/hr at 11/28/23 1400, 25 mcg/kg/min at 11/28/23 1400   QUEtiapine  (SEROQUEL ) tablet 12.5 mg, 12.5 mg, Per Tube, BID, Kassie Acquanetta Slater, MD, 12.5 mg at 11/28/23 0957   senna-docusate (Senokot-S) tablet 1 tablet, 1 tablet, Per Tube, BID, Atway, Rayann N, DO, 1 tablet at 11/28/23 1001   sevelamer  carbonate (RENVELA ) tablet 800 mg, 800 mg, Per Tube, TID WC, Atway, Rayann N, DO, 800 mg at 11/28/23 1131   sodium chloride  flush (NS) 0.9 % injection 3 mL, 3 mL, Intravenous, Q12H, Moody Alto, MD, 3 mL at 11/28/23 1010   sorbitol , magnesium  hydroxide, mineral oil, glycerin  (SMOG) enema, 960 mL, Rectal, Daily PRN, Joanna Bellis, MD   ticagrelor  (BRILINTA ) tablet 90 mg, 90 mg, Per Tube, BID, Atway, Rayann N, DO, 90 mg at 11/28/23 1010   witch hazel-glycerin  (TUCKS) pad, , Topical, PRN, Monetta Bellis, MD  Vitals   Vitals:   11/28/23 1445 11/28/23 1500 11/28/23 1513 11/28/23 1544  BP:    (!) 120/44  Pulse: 73 74  88  Resp: 16 17  17   Temp:   97.9 F (36.6 C)   TempSrc:   Axillary   SpO2: 100% 100%    Weight:      Height:        Body mass index is 27.34 kg/m.  Physical Exam   Constitutional: Appears well-developed and well-nourished.  Eyes:  No scleral injection.  HENT: No OP obstruction. ETT and OGT in place.  Head: Normocephalic.  Cardiovascular: Normal rate and  regular rhythm.  Respiratory: Intubated and mechanically ventilated.   Neurologic Examination   Patient is intubated and sedated on Propofol  (25mcg). She does not open eyes to voice or noxious stimuli. She does not follow commands. With forced eye opening, pupils are sluggishly reactive.  Weak corneals, positive cough and gag reflex.  No blink to threat bilaterally.  No spontaneous movement of any extremity seen.  Slight withdraw seen in LUE. Antigravity withdraw LLE.  No withdraw RUE or RLE.   Labs/Imaging/Neurodiagnostic studies   CBC:  Recent Labs  Lab 2023-12-21 0200 11/27/23 0516 11/28/23 0442  WBC 22.0* 21.3* 21.0*  NEUTROABS 18.5*  --   --   HGB 10.5* 9.5* 9.2*  HCT 30.7* 27.3* 26.4*  MCV 87.2 85.8 85.7  PLT 218 225 268   Basic Metabolic Panel:  Lab Results  Component Value Date   NA 134 (L) 11/28/2023   K 4.8 11/28/2023   CO2 25 11/28/2023   GLUCOSE 180 (H) 11/28/2023   BUN 36 (H) 11/28/2023   CREATININE 6.19 (H) 11/28/2023   CALCIUM  8.4 (L) 11/28/2023   GFRNONAA 7 (L) 11/28/2023   GFRAA 17 06/19/2020   Lipid Panel:  Lab Results  Component Value Date   LDLCALC 37 11/17/2023   HgbA1c:  Lab Results  Component Value Date   HGBA1C 5.8 (H) 10/26/2023   Urine Drug Screen: No results found for: LABOPIA, COCAINSCRNUR, LABBENZ, AMPHETMU, THCU, LABBARB  Alcohol Level No results found for: [facility] INR  Lab Results  Component Value Date   INR 1.1 11/17/2023   APTT  Lab Results  Component Value Date   APTT 37 (H) 11/17/2023   AED levels: No results found for: PHENYTOIN, ZONISAMIDE, LAMOTRIGINE, LEVETIRACETA  CT Head without contrast(: - No acute intracranial process  1/24 MR Angio head without contrast and Carotid Duplex BL: - No LVO - Severe bilateral P2 stenosis - Patent but diffusely narrowed distal right vertebral artery  1/23 MRI Brain(Personally reviewed): - 1.3 cm acute to early subacute right PICA distribution infarct. - No  associated hemorrhage or significant mass effect. - Loss of normal flow void within the intradural right V4 segment, which could reflect slow flow and/or occlusion.  - Underlying moderate to advanced chronic microvascular ischemic disease with remote lacunar infarcts involving the right basal ganglia and left cerebellum. - Underlying atrophy, most pronounced about the frontal lobes bilaterally.   ASSESSMENT   73 y.o. female with PMH significant for CVA with residual left-sided weakness, HD MWF, CHF, DM2, R BKA, Seizures (on Keppra ) who presented 1/23 as a CODE STROKE d/t confusion, left side chest pain, SOB. MRI revealed a 1.3 cm acute to early subacute right PICA infarct. Also admitted with pneumonia due to COVID and RSV and was started on antibiotics for likely superimposed bacterial infection. PEA arrest 1/31 with ROSC achieved after approximately 8 minutes. Following commands post-code, but patient is not following commands now and neurology was consulted. Patient has ESRD and has been on sedation for several days. Possible that her change in responsiveness is due to low clearance of medication/buildup.  Seroquel  was added 2 nights ago due to increased agitation. While patient is sedated on propofol , we can hold this medication.   Ddx: Stroke versus recrudesce of previous stroke symptoms versus Toxic-Metabolic Encephalopathy/Uremic Encephalopathy versus Anoxic Brain Injury (low on the differentials due to short code time and the fact  that patient was following commands after arrest)  RECOMMENDATIONS   - MRI Brain without contrast - Wean sedation as tolerated - Hold pm Seroquel  while on Propofol  - OK to switch sedation back to Precedex  as able. - Avoid Versed  use for patients in ESRD  - Continued electrolyte management, as you are - Ammonia, folate, B12, B1 levels  Addendum: MRI brain: 1. Resolution of small right PICA cerebellar infarct seen in January. New and Acute central left  Cerebellar Infarct with no associated hemorrhage or mass effect.  2. Otherwise stable underlying advanced chronic ischemic disease with some Wallerian degeneration and occasional chronic microhemorrhages.  3. Abnormal but nonspecific partially visible bone marrow signal of the C6 vertebral body. Is there a known malignancy? Alternatively this might be discitis osteomyelitis related, with subtle partial erosions/collapse of the C6 and C7 vertebral bodies on January chest CTA. Dedicated cerVical spine MRI (without and with contrast preferred) may characterize further. 4. Progressed mastoid air cell effusions in the setting of intubation.   ______________________________________________________________________   Pt seen by Neuro NP/APP.   Rocky JAYSON Likes, DNP, AGACNP-BC [facility] Please use AMION for contact information & EPIC for messaging.  Electronically signed: Dr. Daylen Hack

## 2023-11-28 NOTE — Progress Notes (Signed)
 Patient Name: NILAH BELCOURT Date of Encounter: 11/28/2023 [facility] Cardiologist: Annabella Scarce, MD   Interval Summary  .    Intubated and sedated.   Vital Signs .    Vitals:   11/28/23 1115 11/28/23 1117 11/28/23 1130 11/28/23 1145  BP: (!) 110/47  (!) 115/43 (!) 125/47  Pulse:      Resp:      Temp:  98.6 F (37 C)    TempSrc:  Oral    SpO2:      Weight:      Height:        Intake/Output Summary (Last 24 hours) at 11/28/2023 1158 Last data filed at 11/28/2023 1139 Gross per 24 hour  Intake 1452.32 ml  Output 100 ml  Net 1352.32 ml      11/28/2023   10:39 AM 11/28/2023    4:31 AM 11/27/2023    5:00 AM  Last 3 Weights  Weight (lbs) 156 lb 8.4 oz 156 lb 8.4 oz 153 lb 10.6 oz  Weight (kg) 71 kg 71 kg 69.7 kg      Telemetry/ECG    Sinus rhythm. PVCs.   - Personally Reviewed  EKG 11/17/23: Sinus rhythm.  Rate 100 bpm.  PVC.  Prior inferior infarct.   Echo 11/17/23: 1. Left ventricular ejection fraction, by estimation, is 55 to 60%. The  left ventricle has normal function. The left ventricle has no regional  wall motion abnormalities. Elevated left ventricular end-diastolic  pressure.   2. Right ventricular systolic function is normal. The right ventricular  size is normal. There is severely elevated pulmonary artery systolic  pressure.   3. The mitral valve is normal in structure. Trivial mitral valve  regurgitation. No evidence of mitral stenosis.   4. The aortic valve is tricuspid. There is mild calcification of the  aortic valve. Aortic valve regurgitation is not visualized. No aortic  stenosis is present. Aortic valve mean gradient measures 11.0 mmHg. Aortic  valve Vmax measures 2.16 m/s.   5. The inferior vena cava is dilated in size with <50% respiratory  variability, suggesting right atrial pressure of 15 mmHg.    Physical Exam .    VS:  BP (!) 125/47   Pulse 66   Temp 98.6 F (37 C) (Oral)   Resp 16   Ht 5' 3 (1.6 m)   Wt 71 kg    SpO2 100%   BMI 27.73 kg/m  , BMI Body mass index is 27.73 kg/m. GENERAL: Critically ill-appearing.  Intubated and sedated.  HEENT: Oral mucosa unremarkable NECK:  No jugular venous distention, waveform within normal limits, carotid upstroke brisk and symmetric, no bruits, no thyromegaly LUNGS:  Vented breath sounds. HEART:  RRR.  PMI not displaced or sustained,S1 and S2 within normal limits, no S3, no S4, no clicks, no rubs, no murmurs ABD:  Flat, positive bowel sounds normal in frequency in pitch, no bruits, no rebound, no guarding, no midline pulsatile mass, no hepatomegaly, no splenomegaly EXT:  Trace edema, no cyanosis no clubbing SKIN:  No rashes no nodules NEURO:  Unable to assess.  Intubated and sedated.  PSYCH:  Unable to assess.  Intubated and sedated.    Assessment & Plan .     42F with ESRD on HD, CVA, DM, r BKA, seizures and HFpEF admitted with COVID-19 and RSV pneumonia.  Initially with AMS and CVA.  On 1/30 she became hypotensive with HD and had PEA arrest requiring CPR.     # PEA  arrest:  Patient became hypotensive with HD and had a PEA arrest.  She had wide complex tachycardia which may have been VT +/-atrial fibrillation with aberrancy.  CPR was performed and no shocks recommended.  She reportedly initially followed commands but now encephalopathic and not following commands.  Requiring Precedex  and Seroquel .  Repeat limited echo for systolic function assessment is pending. HS-troponin is lower than on admission suggesting that she did not have a major infarct.    # CAD:  # Hyperlipidemia:  # PVCs:  LHC in 2018 revealed heavily calcified 30-40% proximal LAD disease.  LDL 37 on 11/17/23.  HS troponin elevated 205-->199 on admission.  She reported pleuritic chest pain and shortness of breath on admission.  Systolic function preserved prior to the PEA event, so there were no plans for an inpatient ischemic evaluation.  Dr. Kate considered outpatient stress PET after  recovery.  Will repeat echo as above.   # Encephalopathy:  Head CT pending.    For questions or updates, please contact [facility] Please consult [email or web address] for contact info under        Signed, Annabella Scarce, MD

## 2023-11-28 NOTE — Progress Notes (Signed)
Patient with strong frequent cough, SBP increases to the 200s when she is having a coughing spell. Productive at times but mostly nothing being suctioned out of ETT tube. PRN cough medication given.

## 2023-11-28 NOTE — Progress Notes (Signed)
 eLink Physician-Brief Progress Note Patient Name: Carolyn Russo DOB: 1951-07-04 MRN: [id number]   Date of Service  11/28/2023  HPI/Events of Note  Significant agitation and cough.  Seems to be responsive to fentanyl  boluses.  Has a bag hanging  eICU Interventions  Fentanyl  boluses from bag ordered      Intervention Category Minor Interventions: Agitation / anxiety - evaluation and management  Alichia Alridge 11/28/2023, 4:24 AM

## 2023-11-28 NOTE — Progress Notes (Addendum)
 NAME:  [name], MRN:  [id number], DOB:  11-11-1950, LOS: 12 ADMISSION DATE:  11/16/2023, CONSULTATION DATE:  1/30 REFERRING MD:  [facility], CHIEF COMPLAINT:  code blue   History of Present Illness:  Carolyn Russo is a 73 yo female with ESRD on HD MWF, CHF, previous CVA with residual left sided weakness, T2DM, right BKA, and seizure disorder. She presented to [facility] on 1/23 with confusion, left sided chest pain, shortness of breath, and coughing. She was found to be hypoxic on room air and placed on 2 L [place]. CT chest was done without evidence of PE.  MRI revealed a 1.3cm acute to early subacute right PICA infarct. Also was admitted with pneumonia due to COVID and RSV and was started on antibiotics for likely superimposed bacterial infection.    On 1/30 early morning the patient was ~2 hrs into dialysis when her blood pressure dropped. UF goal was decreased but since the patient's level of consciousnesses decreased, she received 300 cc NS bolus. EKG changes were noted and rapid response was activated, dialysis stopped, and blood returned to the patient. Around 6:30 AM the patient was noted to be apneic without a pulse and code blue was called. Patient was noted to have PEA arrest. CPR was done for a few minutes before ROSC was achieved. She was transferred to 80M and patient was intubated due to failure to protect airway.  Pertinent  Medical History  ESRD CHF CVA T2DM Seizures  Significant Hospital Events: Including procedures, antibiotic start and stop dates in addition to other pertinent events   1/23 admit to [facility] for acute infarct + covid/rsv pneumonia 1/30 admit to ICU after patient had PEA arrest  2/1 Failed SBT due to apnea. ABG with respiratory alkalosis. RR decreased 2/2 Tolerating SBT. Remains encephalopathic  Interim History / Subjective:   Tolerated SBT during the day yesterday. Had significant cough and agitation yesterday that was responsive to fentanyl  boluses.  CT head pending.    Objective   Blood pressure (!) 160/44, pulse 73, temperature 99.2 F (37.3 C), temperature source Oral, resp. rate (!) 23, height 5' 3 (1.6 m), weight 71 kg, SpO2 100%.    Vent Mode: PRVC FiO2 (%):  [30 %] 30 % Set Rate:  [15 bmp] 15 bmp Vt Set:  [420 mL] 420 mL PEEP:  [5 cmH20] 5 cmH20 Pressure Support:  [8 cmH20] 8 cmH20 Plateau Pressure:  [15 cmH20] 15 cmH20   Intake/Output Summary (Last 24 hours) at 11/28/2023 9391 Last data filed at 11/28/2023 0500 Gross per 24 hour  Intake 1672.35 ml  Output 100 ml  Net 1572.35 ml   Filed Weights   11/26/23 0533 11/27/23 0500 11/28/23 0431  Weight: 73 kg 69.7 kg 71 kg   Vitals: afebrile, HR normal, BP normal (levo stopped at 6a)  Examination: General: critically ill appearing female laying in bed, encephalopathic HENT: ETT in place, pupils sluggish but reactive Lungs: Diminished, but CTAB.  Cardiovascular: regular rate, rhythm. No murmurs Abdomen: soft, nontender, +BS Extremities: s/p R BKA, no LE edema Neuro: opens eyes occasionally, but is not following any commands.  GU: deferred  Labs, tests, and imaging in the EMR in the last 24 hrs indepenendly reviewed by me. Pertinent findings below  Na 134 Cl 94 BUN/Cr c/w ESRD Mg 2.4 Phos 2.3  WBC 21 Hb 9.2  CBGs 130-222  Resolved Hospital Problem list   Hypokalemia  Assessment & Plan:   PEA arrest: due to hypotension  Acute encephalopathy: secondary  to post-arrest vs delirium vs drug induced - Patient remains persistently altered, no longer following commands - CT head not done yesterday, will obtain today  - Precedex  gtt; will transition to propofol  for better sedation to get CT head - Consider neuro consult if CTH negative - Start seroquel  12.5 mg bid for agitation  Hypotension: presumed hypovolemic initially, now likely related to ongoing sedation  - Levophed  stopped this morning - Continue midodrine  5 mg q8h, MAP goal >65 - BP has been labile, especially during  periods of agitation, was on cleviprex  briefly  - Continue cefepime    Pneumonia due to COVID and RSV Received empiric treatment with ceftriaxone  and azithro for possible superimposed bacterial pneumonia and also s/p course of solumedrol. - Restarted abx post-code, as aboev - blood cultures negative at 4 days  - repeat CXR  Ventilator dependence after PEA arrest Patient was not ever truly hypoxemic, was intubated for failure to protect airway. - Full vent support: PEEP 5, FiO2 35% - VAP bundle, stress ulcer prophylaxis - Daily SBT, continue to wean sedation (mentation precludes extubation)  Hypophosphatemia - hold Renvela  morning dose  Seizure disorder - Continue keppra  500 mg BID  Acute to early subacute right PICA infarct  - Continue DAPT with aspirin  and brilinta  - Continue statin - Needs 30d heart monitor at discharge  ESRD on HD MWF - Dialysis per nephro, last HD session on Saturday, did not get dialyzed yesterday  Anemia of chronic disease - Hb 9.5 - ESA held on 1/31 per nephro   Diabetes - Continue SSI   Best Practice (right click and Reselect all SmartList Selections daily)   Diet/type: tubefeeds DVT prophylaxis prophylactic heparin   Pressure ulcer(s): pressure ulcer assessment deferred  GI prophylaxis: H2B Lines: R subclavian HD cath, R femoral CVC, R femoral art line Foley:  N/A Code Status:  full code Last date of multidisciplinary goals of care discussion [2/3 updated husband]  Labs   CBC: Recent Labs  Lab 11/23/23 0952 11/24/23 0434 11/25/23 0320 11/25/23 1105 11/26/23 0200 11/27/23 0516  WBC 14.1* 11.4* 12.9*  --  22.0* 21.3*  NEUTROABS  --   --   --   --  18.5*  --   HGB 12.6 11.1* 10.4* 11.6* 10.5* 9.5*  HCT 36.2 32.6* 29.6* 34.0* 30.7* 27.3*  MCV 84.6 85.1 83.4  --  87.2 85.8  PLT 311 257 222  --  218 225    Basic Metabolic Panel: Recent Labs  Lab 11/23/23 0952 11/24/23 0434 11/25/23 0320 11/25/23 1105 11/25/23 1736  11/26/23 0200 11/26/23 1839 11/27/23 0516  NA 133* 134* 135 134*  --  134*  --  133*  K 3.4* 3.4* 3.2* 3.1*  --  4.4  --  4.1  CL 93* 95* 95*  --   --  96*  --  97*  CO2 22 21* 22  --   --  20*  --  25  GLUCOSE 222* 84 116*  --   --  232*  --  133*  BUN 21 28* 37*  --   --  47*  --  26*  CREATININE 4.62* 5.52* 6.76*  --   --  7.45*  --  5.00*  CALCIUM  8.7* 8.8* 8.3*  --   --  8.6*  --  8.4*  MG 1.8  --  1.7  --  1.7 2.0 1.7 1.7  PHOS  --   --  3.4  --  3.7 4.0 2.7 2.6   GFR: Estimated  Creatinine Clearance: 9.2 mL/min (A) (by C-G formula based on SCr of 5 mg/dL (H)). Recent Labs  Lab 11/24/23 0434 11/25/23 0320 11/26/23 0200 11/27/23 0516  WBC 11.4* 12.9* 22.0* 21.3*    Liver Function Tests: Recent Labs  Lab 11/24/23 0434 11/26/23 0200  AST 40  --   ALT 69*  --   ALKPHOS 80  --   BILITOT 1.5*  --   PROT 6.5  --   ALBUMIN  3.0* 2.6*   No results for input(s): LIPASE, AMYLASE in the last 168 hours. No results for input(s): AMMONIA in the last 168 hours.  ABG    Component Value Date/Time   PHART 7.515 (H) 11/25/2023 1105   PCO2ART 29.8 (L) 11/25/2023 1105   PO2ART 147 (H) 11/25/2023 1105   HCO3 24.0 11/25/2023 1105   TCO2 25 11/25/2023 1105   ACIDBASEDEF 2.0 05/31/2022 1049   O2SAT 100 11/25/2023 1105     Coagulation Profile: No results for input(s): INR, PROTIME in the last 168 hours.  Cardiac Enzymes: No results for input(s): CKTOTAL, CKMB, CKMBINDEX, TROPONINI in the last 168 hours.  HbA1C: Hemoglobin A1C  Date/Time Value Ref Range Status  12/22/2020 12:00 AM 8.6  Final  06/19/2020 12:00 AM 9.6  Final   Hgb A1c MFr Bld  Date/Time Value Ref Range Status  10/26/2023 11:30 AM 5.8 (H) 4.8 - 5.6 % Final    Comment:    (NOTE)         Prediabetes: 5.7 - 6.4         Diabetes: >6.4         Glycemic control for adults with diabetes: <7.0   02/10/2023 04:52 AM 6.7 (H) 4.8 - 5.6 % Final    Comment:    (NOTE) Pre diabetes:           5.7%-6.4%  Diabetes:              >6.4%  Glycemic control for   <7.0% adults with diabetes     CBG: Recent Labs  Lab 11/27/23 1136 11/27/23 1614 11/27/23 1949 11/27/23 2340 11/28/23 0347  GLUCAP 214* 153* 130* 200* 222*    Review of Systems:   Unable to obtain  Past Medical History:  She,  has a past medical history of Acute GI bleeding, Allergy, Anemia, Anterior chest wall pain, Appendicitis (1965), Asthma, Body mass index 37.0-37.9, adult, Breast pain, Cataract, CHF (congestive heart failure) (HCC), Cognitive change (04/20/2021), Complication of anesthesia, Dehydration (2014), Deviated septum (1971), Diabetes mellitus, Dysphagia due to old stroke, Dyspnea (2014), ESRD on hemodialysis (HCC), Extrinsic asthma, Fibroid (1980), GERD (gastroesophageal reflux disease), Heart murmur, History of migraine, History of seizure, gestational diabetes, Hyperlipidemia, Hypertension (2014), Inguinal hernia (1959), Malaise and fatigue (2014), Non-IgE mediated allergic asthma (2014), Obesity, Pelvic pain, Pregnancy, high-risk (1985), Stroke (HCC) (04/20/2021), Tonsillitis (1968), Uterine fibroid (1980), and Visual field defect.   Surgical History:   Past Surgical History:  Procedure Laterality Date   ABDOMINAL AORTOGRAM W/LOWER EXTREMITY N/A 02/21/2022   Procedure: ABDOMINAL AORTOGRAM W/LOWER EXTREMITY;  Surgeon: Sheree Penne Bruckner, MD;  Location: [facility] INVASIVE CV LAB;  Service: Cardiovascular;  Laterality: N/A;   AMPUTATION Right 05/18/2022   Procedure: RIGHT BELOW KNEE AMPUTATION;  Surgeon: Harden Jerona GAILS, MD;  Location: [facility] OR;  Service: Orthopedics;  Laterality: Right;   AMPUTATION TOE Right 04/15/2022   Procedure: AMPUTATION  LST TOERIGHT FOOT;  Surgeon: Silva Juliene SAUNDERS, DPM;  Location: [facility] ORS;  Service: Podiatry;  Laterality: Right;   APPENDECTOMY  1959  BASCILIC VEIN TRANSPOSITION Right 04/30/2021   Procedure: RIGHT FIRST STAGE BASCILIC VEIN TRANSPOSITION;  Surgeon: Eliza Lonni RAMAN, MD;  Location: [facility] OR;  Service: Vascular;  Laterality: Right;   CESAREAN SECTION  1985   COLONOSCOPY     ESOPHAGOGASTRODUODENOSCOPY (EGD) WITH PROPOFOL  N/A 04/18/2021   Procedure: ESOPHAGOGASTRODUODENOSCOPY (EGD) WITH PROPOFOL ;  Surgeon: Avram Lupita BRAVO, MD;  Location: [facility] ENDOSCOPY;  Service: Endoscopy;  Laterality: N/A;   EYE SURGERY     bilateral cataract    FLEXIBLE SIGMOIDOSCOPY N/A 04/18/2021   Procedure: FLEXIBLE SIGMOIDOSCOPY;  Surgeon: Avram Lupita BRAVO, MD;  Location: [facility] ENDOSCOPY;  Service: Endoscopy;  Laterality: N/A;   HERNIA REPAIR  1959   IR FLUORO GUIDE CV LINE RIGHT  03/18/2021   IR US  GUIDE VASC ACCESS RIGHT  03/18/2021   LEFT HEART CATH AND CORONARY ANGIOGRAPHY N/A 04/04/2017   Procedure: Left Heart Cath and Coronary Angiography;  Surgeon: Claudene Victory ORN, MD;  Location: [facility] INVASIVE CV LAB;  Service: Cardiovascular;  Laterality: N/A;   MYOMECTOMY  1980, 2004, 2007   PERIPHERAL VASCULAR THROMBECTOMY  02/21/2022   Procedure: PERIPHERAL VASCULAR THROMBECTOMY;  Surgeon: Sheree Penne Lonni, MD;  Location: [facility] INVASIVE CV LAB;  Service: Cardiovascular;;  Right Tibial   RHINOPLASTY  1971   ROTATOR CUFF REPAIR  2003   SURGICAL REPAIR OF HEMORRHAGE  2015   TONSILLECTOMY  1968     Social History:   reports that she has never smoked. She has never used smokeless tobacco. She reports that she does not drink alcohol and does not use drugs.   Family History:  Her family history includes Alzheimer's disease in her father; Colon cancer in her cousin; Diabetes in her cousin and maternal aunt; Melanoma in her mother; Psoriasis in her mother. There is no history of Colon polyps, Esophageal cancer, Cancer - Colon, Liver disease, BRCA 1/2, or Breast cancer.   Allergies Allergies  Allergen Reactions   Almond Oil Anaphylaxis   Food Anaphylaxis    Peanuts - anaphylaxis     Gadolinium Derivatives Other (See Comments)    Gadolinium-Containing Contrast Media    Januvia  [Sitagliptin ]  Other (See Comments)   Pork-Derived Products Other (See Comments)    Does not eat pork       Home Medications  Prior to Admission medications   Medication Sig Start Date End Date Taking? Authorizing Provider  acetaminophen  (TYLENOL ) 500 MG tablet Take 1,000 mg by mouth 2 (two) times daily as needed (leg and arm pain).   Yes [provider]  acetaminophen  (TYLENOL ) 650 MG CR tablet Take 650 mg by mouth 2 (two) times daily.   Yes [provider]  albuterol  (PROVENTIL ) (2.5 MG/3ML) 0.083% nebulizer solution Use 1 vial (2.5 mg total) by nebulization every 4 (four) hours as needed for wheezing or shortness of breath. 02/14/23 02/14/24 Yes Vann, Jessica U, DO  Amino Acids-Protein Hydrolys (PRO-STAT SUGAR FREE PO) Take 30 mLs by mouth 2 (two) times daily.   Yes [provider]  amitriptyline  (ELAVIL ) 25 MG tablet Take 0.5-1 tablets (12.5-25 mg total) by mouth at bedtime. Patient taking differently: Take 12.5 mg by mouth at bedtime. 09/11/23  Yes Lovorn, Megan, MD  aspirin  81 MG chewable tablet Chew 81 mg by mouth daily.   Yes [provider]  calcitRIOL  (ROCALTROL ) 0.5 MCG capsule Take 0.5 mcg by mouth See admin instructions. Only takes on Dialysis days Monday,Wednesday,Friday 07/10/23  Yes [provider]  cinacalcet  (SENSIPAR ) 30 MG tablet Take 30 mg by  mouth daily.   Yes [provider]  cyclobenzaprine  (FLEXERIL ) 5 MG tablet Take 1 tablet (5 mg total) by mouth 3 (three) times daily as needed for muscle spasms. 11/01/23  Yes Vernon Ranks, MD  diclofenac  Sodium (VOLTAREN ) 1 % GEL Apply 2 g topically 4 (four) times daily. To left wrist 05/26/21  Yes Love, Sharlet RAMAN, PA-C  Glucagon, rDNA, (GLUCAGON EMERGENCY) 1 MG KIT Inject 1 mg into the vein as needed (CBG of 65mg /dL).   Yes [provider]  hydrALAZINE  (APRESOLINE ) 100 MG tablet Take 1 tablet (100 mg total) by mouth 3 (three) times daily. 11/01/23 12/01/23 Yes Vernon Ranks, MD  insulin  aspart  (NOVOLOG ) 100 UNIT/ML injection Inject 0-6 Units into the skin 3 (three) times daily with meals. CBG < 70: Implement Hypoglycemia Standing Orders and refer to Hypoglycemia Standing Orders sidebar report CBG 70 - 120: 0 units CBG 121 - 150: 0 units CBG 151 - 200: 1 unit CBG 201-250: 2 units CBG 251-300: 3 units CBG 301-350: 4 units CBG 351-400: 5 units CBG > 400: Give 6 units and call MD 06/29/22  Yes Pokhrel, Laxman, MD  levETIRAcetam  (KEPPRA ) 250 MG tablet Take 1.5 tablets (375 mg total) by mouth every Monday, Wednesday, and Friday at 6 PM. 06/29/22  Yes Pokhrel, Laxman, MD  levETIRAcetam  (KEPPRA ) 750 MG tablet Take 1 tablet (750 mg total) by mouth at bedtime. 06/29/22  Yes Pokhrel, Laxman, MD  loratadine  (CLARITIN ) 10 MG tablet Take 10 mg by mouth daily.   Yes [provider]  losartan  (COZAAR ) 50 MG tablet Take 1 tablet (50 mg total) by mouth 2 (two) times daily. 11/01/23 12/01/23 Yes Pahwani, Ranks, MD  melatonin 5 MG TABS Take 5 mg by mouth at bedtime.   Yes [provider]  mometasone -formoterol  (DULERA ) 200-5 MCG/ACT AERO Inhale 2 puffs into the lungs 2 (two) times daily. 04/15/22  Yes Jarold Medici, MD  pantoprazole  (PROTONIX ) 40 MG tablet Take 1 tablet (40 mg total) by mouth daily. Patient taking differently: Take 40 mg by mouth at bedtime. 02/07/22  Yes Jarold Medici, MD  prochlorperazine  (COMPAZINE ) 10 MG tablet Take 1 tablet (10 mg total) by mouth every 8 (eight) hours as needed for nausea or vomiting. 02/15/23  Yes Danis, Victory LITTIE MOULD, MD  rosuvastatin  (CRESTOR ) 20 MG tablet Take 1 tablet (20 mg total) by mouth daily. Patient taking differently: Take 20 mg by mouth at bedtime. 08/18/21  Yes Ghumman, Ramandeep, NP  sevelamer  carbonate (RENVELA ) 0.8 g PACK packet Take 0.8 g by mouth 3 (three) times daily. 10/19/21  Yes [provider]  ticagrelor  (BRILINTA ) 90 MG TABS tablet Take 1 tablet (90 mg total) by mouth 2 (two) times daily. 06/10/22  Yes Leotis Bogus, MD   aspirin  EC 81 MG tablet Take 1 tablet (81 mg total) by mouth daily. Swallow whole. Patient not taking: Reported on 11/17/2023 06/10/22   Leotis Bogus, MD  gabapentin  (NEURONTIN ) 300 MG capsule Take 1 capsule (300 mg total) by mouth at bedtime. Patient not taking: Reported on 11/17/2023 09/11/23   Lovorn, Megan, MD  hydrocortisone  (ANUSOL -HC) 2.5 % rectal cream Place rectally 4 (four) times daily. Patient not taking: Reported on 11/17/2023 02/14/23   Vann, Jessica U, DO  Iron  Sucrose (VENOFER  IV) Inject 50 mg into the skin once a week. 08/16/23 10/18/24  [provider]  Methoxy PEG-Epoetin  Beta (MIRCERA IJ) Inject 1 Dose into the skin every 14 (fourteen) days. 04/19/23 08/28/24  [provider]  methylphenidate  (RITALIN ) 5 MG  tablet Take 1 tablet (5 mg total) by mouth 2 (two) times daily with breakfast and lunch. Patient not taking: Reported on 11/17/2023 09/11/23   Lovorn, Megan, MD     Critical care time: 30 min

## 2023-11-28 NOTE — Progress Notes (Signed)
 Pharmacy ICU Bowel Regimen Consult Note   Current Inpatient Medications for Bowel Management:  Miralax  17g daily Docusate 100mg  BID  Assessment: Carolyn Russo is a 73 y.o. year old female admitted on 11/16/2023. Constipation identified as acute opioid-induced constipation . Bowel regimen assessment completed by Thersia, RN on 11/28/23. LBM on 11/24/23.  [x]  Bowel sounds present  [x]  No abdominal tenderness  [x]  Passing gas   Due to length of time without bowel movement (4 days), discussed starting Senna-S BID (1 tab) and increase Miralax  to BID today- varied slightly from protocol - agreed. If no BM tomorrow, will plan to step up Senna to 2 tablets BID per protocol.   Plan: Senna-S 1 tablet BID + Miralax  17g BID.  MD contacted (if needed): Dr. Jimmy  Thank you for allowing pharmacy to participate in this patient's care.  Harlene Boga, PharmD, BCPS, BCCCP Please refer to [facility] for [facility] Pharmacy numbers 11/28/2023,9:15 AM

## 2023-11-29 ENCOUNTER — Inpatient Hospital Stay (HOSPITAL_COMMUNITY): Payer: Medicare PPO

## 2023-11-29 DIAGNOSIS — I469 Cardiac arrest, cause unspecified: Secondary | ICD-10-CM | POA: Diagnosis not present

## 2023-11-29 DIAGNOSIS — U071 COVID-19: Secondary | ICD-10-CM | POA: Diagnosis not present

## 2023-11-29 DIAGNOSIS — G9340 Encephalopathy, unspecified: Secondary | ICD-10-CM | POA: Diagnosis not present

## 2023-11-29 DIAGNOSIS — I639 Cerebral infarction, unspecified: Secondary | ICD-10-CM | POA: Diagnosis not present

## 2023-11-29 DIAGNOSIS — R092 Respiratory arrest: Secondary | ICD-10-CM

## 2023-11-29 DIAGNOSIS — I63542 Cerebral infarction due to unspecified occlusion or stenosis of left cerebellar artery: Secondary | ICD-10-CM | POA: Diagnosis not present

## 2023-11-29 DIAGNOSIS — R569 Unspecified convulsions: Secondary | ICD-10-CM | POA: Diagnosis not present

## 2023-11-29 LAB — RENAL FUNCTION PANEL
Albumin: 2.1 g/dL — ABNORMAL LOW (ref 3.5–5.0)
Anion gap: 11 (ref 5–15)
BUN: 25 mg/dL — ABNORMAL HIGH (ref 8–23)
CO2: 28 mmol/L (ref 22–32)
Calcium: 8.6 mg/dL — ABNORMAL LOW (ref 8.9–10.3)
Chloride: 92 mmol/L — ABNORMAL LOW (ref 98–111)
Creatinine, Ser: 4.24 mg/dL — ABNORMAL HIGH (ref 0.44–1.00)
GFR, Estimated: 10 mL/min — ABNORMAL LOW (ref >60)
Glucose, Bld: 151 mg/dL — ABNORMAL HIGH (ref 70–99)
Phosphorus: 2.2 mg/dL — ABNORMAL LOW (ref 2.5–4.6)
Potassium: 4 mmol/L (ref 3.5–5.1)
Sodium: 131 mmol/L — ABNORMAL LOW (ref 135–145)

## 2023-11-29 LAB — GLUCOSE, CAPILLARY
Glucose-Capillary: 157 mg/dL — ABNORMAL HIGH (ref 70–99)
Glucose-Capillary: 147 mg/dL — ABNORMAL HIGH (ref 70–99)
Glucose-Capillary: 74 mg/dL (ref 70–99)
Glucose-Capillary: 158 mg/dL — ABNORMAL HIGH (ref 70–99)
Glucose-Capillary: 231 mg/dL — ABNORMAL HIGH (ref 70–99)
Glucose-Capillary: 200 mg/dL — ABNORMAL HIGH (ref 70–99)

## 2023-11-29 LAB — CBC
HCT: 25.2 % — ABNORMAL LOW (ref 36.0–46.0)
Hemoglobin: 8.7 g/dL — ABNORMAL LOW (ref 12.0–15.0)
MCH: 29.8 pg (ref 26.0–34.0)
MCHC: 34.5 g/dL (ref 30.0–36.0)
MCV: 86.3 fL (ref 80.0–100.0)
Platelets: 303 10*3/uL (ref 150–400)
RBC: 2.92 MIL/uL — ABNORMAL LOW (ref 3.87–5.11)
RDW: 17.8 % — ABNORMAL HIGH (ref 11.5–15.5)
WBC: 20.1 10*3/uL — ABNORMAL HIGH (ref 4.0–10.5)
nRBC: 0 % (ref 0.0–0.2)

## 2023-11-29 LAB — MAGNESIUM: Magnesium: 2.3 mg/dL (ref 1.7–2.4)

## 2023-11-29 LAB — TRIGLYCERIDES: Triglycerides: 103 mg/dL (ref <150)

## 2023-11-29 MED ORDER — CHLORHEXIDINE GLUCONATE CLOTH 2 % EX PADS
6.0000 | MEDICATED_PAD | Freq: Every day | CUTANEOUS | Status: DC
  Administered 2023-11-30 – 2023-12-03 (×4): 6 via TOPICAL

## 2023-11-29 MED ORDER — DEXMEDETOMIDINE HCL IN NACL 400 MCG/100ML IV SOLN
0.0000 ug/kg/h | INTRAVENOUS | Status: DC
  Administered 2023-11-29 (×2): 0.5 ug/kg/h via INTRAVENOUS
  Administered 2023-11-30: 0.6 ug/kg/h via INTRAVENOUS
  Administered 2023-11-30: 0.8 ug/kg/h via INTRAVENOUS
  Administered 2023-12-01: 1.2 ug/kg/h via INTRAVENOUS
  Administered 2023-12-01: 0.7 ug/kg/h via INTRAVENOUS
  Administered 2023-12-01: 1.2 ug/kg/h via INTRAVENOUS
  Administered 2023-12-02: 1 ug/kg/h via INTRAVENOUS
  Administered 2023-12-02: 0.5 ug/kg/h via INTRAVENOUS
  Administered 2023-12-02: 1.1 ug/kg/h via INTRAVENOUS
  Administered 2023-12-02: 1 ug/kg/h via INTRAVENOUS
  Filled 2023-11-29 (×5): qty 100
  Filled 2023-11-29: qty 200
  Filled 2023-11-29 (×6): qty 100

## 2023-11-29 MED ORDER — ROSUVASTATIN CALCIUM 5 MG PO TABS
10.0000 mg | ORAL_TABLET | Freq: Every day | ORAL | Status: DC
  Administered 2023-11-29 – 2023-12-20 (×22): 10 mg
  Filled 2023-11-29: qty 2
  Filled 2023-11-29: qty 1
  Filled 2023-11-29 (×6): qty 2
  Filled 2023-11-29 (×2): qty 1
  Filled 2023-11-29 (×2): qty 2
  Filled 2023-11-29: qty 1
  Filled 2023-11-29 (×2): qty 2
  Filled 2023-11-29 (×2): qty 1
  Filled 2023-11-29 (×6): qty 2

## 2023-11-29 NOTE — Progress Notes (Signed)
 Nephrology Follow-Up Consult note   Assessment/Recommendations: Carolyn Russo is a/an 73 y.o. female with a past medical history significant for ESRD, admitted for AMS w/ AHRF, CAP, COVID, RSV.       Dialysis Orders: MWF NW  4h   B350    75.5kg   2/2 bath   R fem TDC  Heparin  2600 + 1400 midrun - last HD 1/22, getting to dry wt - 3-4 drops in dry wt in last 3 wks - venofer  100 qhd thru 1/31 - mircera 150 q2, last 1/17, due 1/31    CXR 2/04 - no edema   Assessment/Plan: AHRF/ +COVID and +RSV pna - some volume excess during the hospitalization. Abx per primary. Now on ventilator after cardiac arrest.  Vent weaning as able CVA - R PICA area, mgmt per neurology.  AMS - multifactorial ESRD - on HD MWF.  Had HD Tuesday. Plan next HD Thursday  Hypotension - off levo support, getting midodrine  5 tid. Per pmd.  Volume - down 5kg from edw, last CXR 2/04 no edema. Keep even w/ next HD. Anemia of esrd - Hfb at goal. ESA due on 1/31 but held. Redose if needed Secondary hyperparathyroidism - Cont home binders when taking po H/o seizure d/o - on Keppra  PEA arrest- mgmt per ccm.   GOC: chronically ill now w/ AMS, hypoxia and cardiac arrest.  Family wishes to continue with course of care for now    [name] 11/29/2023 2:05 PM  ___________________________________________________________  Interval History/Subjective: on vent and sedated    Physical Exam: Vitals:   11/29/23 1212 11/29/23 1300  BP: (!) 184/69   Pulse: 79 (!) 206  Resp: (!) 28 15  Temp:    SpO2: 90% 96%   GEN: Chronically ill-appearing, lying in bed, no distress ENT: no nasal discharge, mmm, ET tube in place EYES: no scleral icterus, eyes closed CV: normal rate, no rub PULM: Bilateral chest rise, ventilated ABD: NABS, non-distended SKIN: no rashes or jaundice EXT: minimal edema, warm and well perfused    Recent Labs  Lab 11/26/23 0200 11/26/23 1839 11/28/23 0442  11/29/23 0511  HGB 10.5*   < > 9.2* 8.7*  ALBUMIN  2.6*  --   --  2.1*  CALCIUM  8.6*   < > 8.4* 8.6*  PHOS 4.0   < > 2.3* 2.2*  CREATININE 7.45*   < > 6.19* 4.24*  K 4.4   < > 4.8 4.0   < > = values in this interval not displayed.    Inpatient medications:   stroke: early stages of recovery book   Does not apply Once   aspirin   81 mg Per Tube Daily   Chlorhexidine  Gluconate Cloth  6 each Topical Q0600   famotidine   10 mg Per Tube Daily   heparin  injection (subcutaneous)  5,000 Units Subcutaneous Q8H   insulin  aspart  0-15 Units Subcutaneous Q4H   lidocaine   1 patch Transdermal Q24H   midodrine   5 mg Per Tube Q8H   multivitamin  1 tablet Per Tube QHS   mouth rinse  15 mL Mouth Rinse Q2H   polyethylene glycol  17 g Per Tube BID   QUEtiapine   12.5 mg Per Tube BID   senna-docusate  1 tablet Per Tube BID   sodium chloride  flush  3 mL Intravenous Q12H   ticagrelor   90 mg Per Tube BID    dexmedetomidine  (PRECEDEX ) IV infusion 0.5 mcg/kg/hr (11/29/23 1328)   feeding supplement (VITAL  1.5 CAL) 30 mL/hr at 11/29/23 1213   levETIRAcetam  500 mg (11/29/23 1202)   acetaminophen  **OR** acetaminophen , bisacodyl , chlorpheniramine-HYDROcodone , fentaNYL  (SUBLIMAZE ) injection, hydrALAZINE , labetalol , ondansetron  (ZOFRAN ) IV, mouth rinse, SMOG, witch hazel-glycerin 

## 2023-11-29 NOTE — Progress Notes (Signed)
 Nutrition Follow-up  DOCUMENTATION CODES:   Not applicable  INTERVENTION:  Continue TF via OGT: Recommend advancing back to goal- Vital 1.5 at 6ml/hr ( per day) Provides 1620 kcal, 73g protein and per day  NUTRITION DIAGNOSIS:   Inadequate oral intake related to acute illness as evidenced by NPO status. - remains applicable  GOAL:   Patient will meet greater than or equal to 90% of their needs - goal met via TF  MONITOR:   Vent status, Labs, Weight trends, TF tolerance, I & O's  REASON FOR ASSESSMENT:   Ventilator    ASSESSMENT:   Pt  presented 1/23 with confusion and worsening L chest pain secondary to COVID and RSV infection. PMH significant for ESRD on HD, CHF, CVA with residual L sided weakness, T2DM, R BKA, seizure disorder.  1/23: MRI brain- revealed 1.3 cm acute to early subacute R PICA distribution infarct 1/28: MBS recommend dysphagia 2, thin liquids 1/30: PEA arrest; intubated; NPO  2/1: failed SBT d/t apnea 2/2: tolerating SBT, remains encephalopathic 2/4: MRI brain- resolution of small R PICA cerebellar infarct, new/acute central L cerebellar infarct with no associated hemorrhage   Patient remains intubated on ventilator support MV: 8.2 L/min Temp (24hrs), Avg:98.7 F (37.1 C), Min:97.9 F (36.6 C), Max:99.5 F (37.5 C)  Neuro re-consulted d/t change in mental status. Suspect change in responsiveness is d/t low clearance of medication/buildup.   TF held 2/3 given pt with extensive coughing leading to emesis. MD agreed to re-starting at trickle yesterday to ensure adequate tolerance. Spoke with RN who reports pt tolerating TF without further episodes of emesis. Having bowel movements. Discussed with DO who agrees to advancing TF back to goal rate.   Noted next HD postponed to tomorrow. Renvela  d/c due to low Phos.   Admit weight: 79.5 kg  Current weight: 70 kg EDW: 75.5 kg  Drains/lines: OGT (distal stomach) RUE fistula    Medications: pepcid , SSI 0-15 units q4h, rena-vit, miralax , senna, renvela  Drips: Levo stopped this morning  Labs:  Sodium 131 BUN 25 Cr 4.24 Phos 2.2 GFR 10 CBG's 108-157 x24 hours  Diet Order:   Diet Order             Diet NPO time specified  Diet effective now                   EDUCATION NEEDS:   No education needs have been identified at this time  Skin:  Skin Assessment: Reviewed RN Assessment  Last BM:  2/5 type 7 large  Height:   Ht Readings from Last 1 Encounters:  11/28/23 5' 3 (1.6 m)    Weight:   Wt Readings from Last 1 Encounters:  11/29/23 70 kg   MI:  Body mass index is 27.34 kg/m.  Estimated Nutritional Needs:   Kcal:  1500-1700  Protein:  75-90g  Fluid:  1L + UOP  Royce Maris, RDN, LDN Clinical Nutrition

## 2023-11-29 NOTE — Progress Notes (Signed)
 EEG complete - results pending

## 2023-11-29 NOTE — Progress Notes (Addendum)
 NAME:  [name], MRN:  [id number], DOB:  [birth year], LOS: 13 ADMISSION DATE:  11/16/2023, CONSULTATION DATE:  1/30 REFERRING MD:  [facility], CHIEF COMPLAINT:  code blue   History of Present Illness:  Carolyn Russo is a 73 yo female with ESRD on HD MWF, CHF, previous CVA with residual left sided weakness, T2DM, right BKA, and seizure disorder. She presented to [facility] on 1/23 with confusion, left sided chest pain, shortness of breath, and coughing. She was found to be hypoxic on room air and placed on 2 L [place]. CT chest was done without evidence of PE.  MRI revealed a 1.3cm acute to early subacute right PICA infarct. Also was admitted with pneumonia due to COVID and RSV and was started on antibiotics for likely superimposed bacterial infection.    On 1/30 early morning the patient was ~2 hrs into dialysis when her blood pressure dropped. UF goal was decreased but since the patient's level of consciousnesses decreased, she received 300 cc NS bolus. EKG changes were noted and rapid response was activated, dialysis stopped, and blood returned to the patient. Around 6:30 AM the patient was noted to be apneic without a pulse and code blue was called. Patient was noted to have PEA arrest. CPR was done for a few minutes before ROSC was achieved. She was transferred to 94M and patient was intubated due to failure to protect airway.  Pertinent  Medical History  ESRD CHF CVA T2DM Seizures  Significant Hospital Events: Including procedures, antibiotic start and stop dates in addition to other pertinent events   1/23 admit to [facility] for acute infarct + covid/rsv pneumonia 1/30 admit to ICU after patient had PEA arrest  2/1 Failed SBT due to apnea. ABG with respiratory alkalosis. RR decreased 2/2 Tolerating SBT. Remains encephalopathic  Interim History / Subjective:   Remains sedated this morning, on propofol . Does not follow commands.   Objective   Blood pressure (!) 120/44, pulse 76, temperature 99.5 F  (37.5 C), temperature source Oral, resp. rate 18, height 5' 3 (1.6 m), weight 70 kg, SpO2 100%.    Vent Mode: PRVC FiO2 (%):  [30 %-40 %] 30 % Set Rate:  [15 bmp] 15 bmp Vt Set:  [420 mL] 420 mL PEEP:  [5 cmH20] 5 cmH20 Pressure Support:  [5 cmH20] 5 cmH20 Plateau Pressure:  [16 cmH20-20 cmH20] 20 cmH20   Intake/Output Summary (Last 24 hours) at 11/29/2023 9373 Last data filed at 11/29/2023 0600 Gross per 24 hour  Intake 1286.7 ml  Output 1000 ml  Net 286.7 ml   Filed Weights   11/28/23 1039 11/28/23 1409 11/29/23 0500  Weight: 71 kg 70 kg 70 kg    Examination: General: critically ill appearing female laying in bed, encephalopathic HENT: ETT in place, pupils sluggish but reactive Lungs: Diminished, but CTAB.  Cardiovascular: regular rate, rhythm. No murmurs Abdomen: soft, nontender, +BS Extremities: s/p R BKA, no LE edema Neuro: opens eyes occasionally, but is not following any commands.  GU: deferred  Labs, tests, and imaging in the EMR in the last 24 hrs indepenendly reviewed by me. Pertinent findings below  Na 131 Cl 92 BUN/Cr c/w ESRD Mg 2.3 Phos 2.2  WBC 20.2 Hb 8.7  CBGs 108-157  Resolved Hospital Problem list   Hypokalemia  Assessment & Plan:   PEA arrest: due to hypotension  Acute encephalopathy: secondary to post-arrest vs delirium vs drug induced vs stroke Patient remains persistently altered, no longer following commands. Concern for cefepime  toxicity  given her hx of ESRD. Additionally, CTH negative for signs of anoxia, but MRI brain did show a new and acute central left cerebellar infarct. MRI also showed a nonspecific bone marrow signal of c6 vertebral body - unclear if related to a malignancy vs discitis/osteomyelitis. Will hold off on C spine imaging, as family does not want to pursue this workup and priority is mental status/neuro recovery.  - Appreciate neurology recs - Stop propofol , start precedex  - Continue seroquel  12.5 mg bid for agitation -  Cefepime  discontinued 2/4  Hypotension: presumed hypovolemic initially, now likely related to ongoing sedation  - Levophed  stopped this morning - Continue midodrine  5 mg q8h, MAP goal >65, will discontinue if BP persistently high   Pneumonia due to COVID and RSV Received empiric treatment with ceftriaxone  and azithro for possible superimposed bacterial pneumonia and also s/p course of solumedrol. Also received 6 days of cefepime  post-code, discontinued yesterday given concern for cefepime  toxicity. Repeat CXR yesterday continued to show cardiomegaly with some vascular congestion.   Ventilator dependence after PEA arrest Patient was not ever truly hypoxemic, was intubated for failure to protect airway. - Full vent support: PEEP 5, FiO2 30% - VAP bundle, stress ulcer prophylaxis - Daily SBT, continue to wean sedation (mentation continues to preclude extubation)  Acute to early subacute right PICA infarct on 1/24, resolved New acute central left cerebellar infarct  - Appreciate neuro follow up  - Continue DAPT with aspirin  and brilinta  - Continue statin - Needs 30d heart monitor at discharge  Hypophosphatemia - phos remains low, discontinue renvela    Seizure disorder - Continue keppra  500 mg BID  ESRD on HD MWF - Dialysis per nephro, next HD postponed to tomorrow   Anemia of chronic disease - Hb 9.5 - ESA held on 1/31 per nephro   Diabetes - Continue SSI   Best Practice (right click and Reselect all SmartList Selections daily)   Diet/type: tubefeeds DVT prophylaxis prophylactic heparin   Pressure ulcer(s): pressure ulcer assessment deferred  GI prophylaxis: H2B Lines: R subclavian HD cath, R femoral CVC, R femoral art line Foley:  N/A Code Status:  full code Last date of multidisciplinary goals of care discussion [2/4 updated husband]  Labs   CBC: Recent Labs  Lab 11/25/23 0320 11/25/23 1105 11/26/23 0200 11/27/23 0516 11/28/23 0442 11/29/23 0511  WBC 12.9*   --  22.0* 21.3* 21.0* 20.1*  NEUTROABS  --   --  18.5*  --   --   --   HGB 10.4* 11.6* 10.5* 9.5* 9.2* 8.7*  HCT 29.6* 34.0* 30.7* 27.3* 26.4* 25.2*  MCV 83.4  --  87.2 85.8 85.7 86.3  PLT 222  --  218 225 268 303    Basic Metabolic Panel: Recent Labs  Lab 11/25/23 0320 11/25/23 1105 11/25/23 1736 11/26/23 0200 11/26/23 1839 11/27/23 0516 11/28/23 0442 11/29/23 0511  NA 135 134*  --  134*  --  133* 134* 131*  K 3.2* 3.1*  --  4.4  --  4.1 4.8 4.0  CL 95*  --   --  96*  --  97* 94* 92*  CO2 22  --   --  20*  --  25 25 28   GLUCOSE 116*  --   --  232*  --  133* 180* 151*  BUN 37*  --   --  47*  --  26* 36* 25*  CREATININE 6.76*  --   --  7.45*  --  5.00* 6.19* 4.24*  CALCIUM  8.3*  --   --  8.6*  --  8.4* 8.4* 8.6*  MG 1.7  --    < > 2.0 1.7 1.7 2.4 2.3  PHOS 3.4  --    < > 4.0 2.7 2.6 2.3* 2.2*   < > = values in this interval not displayed.   GFR: Estimated Creatinine Clearance: 10.8 mL/min (A) (by C-G formula based on SCr of 4.24 mg/dL (H)). Recent Labs  Lab 11/26/23 0200 11/27/23 0516 11/28/23 0442 11/29/23 0511  WBC 22.0* 21.3* 21.0* 20.1*    Liver Function Tests: Recent Labs  Lab 11/24/23 0434 11/26/23 0200 11/29/23 0511  AST 40  --   --   ALT 69*  --   --   ALKPHOS 80  --   --   BILITOT 1.5*  --   --   PROT 6.5  --   --   ALBUMIN  3.0* 2.6* 2.1*   No results for input(s): LIPASE, AMYLASE in the last 168 hours. No results for input(s): AMMONIA in the last 168 hours.  ABG    Component Value Date/Time   PHART 7.515 (H) 11/25/2023 1105   PCO2ART 29.8 (L) 11/25/2023 1105   PO2ART 147 (H) 11/25/2023 1105   HCO3 24.0 11/25/2023 1105   TCO2 25 11/25/2023 1105   ACIDBASEDEF 2.0 05/31/2022 1049   O2SAT 100 11/25/2023 1105     Coagulation Profile: No results for input(s): INR, PROTIME in the last 168 hours.  Cardiac Enzymes: No results for input(s): CKTOTAL, CKMB, CKMBINDEX, TROPONINI in the last 168 hours.  HbA1C: Hemoglobin A1C   Date/Time Value Ref Range Status  12/22/2020 12:00 AM 8.6  Final  06/19/2020 12:00 AM 9.6  Final   Hgb A1c MFr Bld  Date/Time Value Ref Range Status  10/26/2023 11:30 AM 5.8 (H) 4.8 - 5.6 % Final    Comment:    (NOTE)         Prediabetes: 5.7 - 6.4         Diabetes: >6.4         Glycemic control for adults with diabetes: <7.0   02/10/2023 04:52 AM 6.7 (H) 4.8 - 5.6 % Final    Comment:    (NOTE) Pre diabetes:          5.7%-6.4%  Diabetes:              >6.4%  Glycemic control for   <7.0% adults with diabetes     CBG: Recent Labs  Lab 11/28/23 1115 11/28/23 1509 11/28/23 1937 11/28/23 2344 11/29/23 0437  GLUCAP 108* 139* 130* 108* 157*    Review of Systems:   Unable to obtain  Past Medical History:  She,  has a past medical history of Acute GI bleeding, Allergy, Anemia, Anterior chest wall pain, Appendicitis (1965), Asthma, Body mass index 37.0-37.9, adult, Breast pain, Cataract, CHF (congestive heart failure) (HCC), Cognitive change (04/20/2021), Complication of anesthesia, Dehydration (2014), Deviated septum (1971), Diabetes mellitus, Dysphagia due to old stroke, Dyspnea (2014), ESRD on hemodialysis (HCC), Extrinsic asthma, Fibroid (1980), GERD (gastroesophageal reflux disease), Heart murmur, History of migraine, History of seizure, gestational diabetes, Hyperlipidemia, Hypertension (2014), Inguinal hernia (1959), Malaise and fatigue (2014), Non-IgE mediated allergic asthma (2014), Obesity, Pelvic pain, Pregnancy, high-risk (1985), Stroke (HCC) (04/20/2021), Tonsillitis (1968), Uterine fibroid (1980), and Visual field defect.   Surgical History:   Past Surgical History:  Procedure Laterality Date   ABDOMINAL AORTOGRAM W/LOWER EXTREMITY N/A 02/21/2022   Procedure: ABDOMINAL AORTOGRAM W/LOWER EXTREMITY;  Surgeon: Sheree Penne Bruckner, MD;  Location: [facility] INVASIVE CV  LAB;  Service: Cardiovascular;  Laterality: N/A;   AMPUTATION Right 05/18/2022   Procedure: RIGHT BELOW  KNEE AMPUTATION;  Surgeon: Harden Jerona GAILS, MD;  Location: [facility] OR;  Service: Orthopedics;  Laterality: Right;   AMPUTATION TOE Right 04/15/2022   Procedure: AMPUTATION  LST TOERIGHT FOOT;  Surgeon: Silva Juliene SAUNDERS, DPM;  Location: [facility] ORS;  Service: Podiatry;  Laterality: Right;   APPENDECTOMY  1959   BASCILIC VEIN TRANSPOSITION Right 04/30/2021   Procedure: RIGHT FIRST STAGE BASCILIC VEIN TRANSPOSITION;  Surgeon: Eliza Lonni RAMAN, MD;  Location: [facility] OR;  Service: Vascular;  Laterality: Right;   CESAREAN SECTION  1985   COLONOSCOPY     ESOPHAGOGASTRODUODENOSCOPY (EGD) WITH PROPOFOL  N/A 04/18/2021   Procedure: ESOPHAGOGASTRODUODENOSCOPY (EGD) WITH PROPOFOL ;  Surgeon: Avram Lupita BRAVO, MD;  Location: [facility] ENDOSCOPY;  Service: Endoscopy;  Laterality: N/A;   EYE SURGERY     bilateral cataract    FLEXIBLE SIGMOIDOSCOPY N/A 04/18/2021   Procedure: FLEXIBLE SIGMOIDOSCOPY;  Surgeon: Avram Lupita BRAVO, MD;  Location: [facility] ENDOSCOPY;  Service: Endoscopy;  Laterality: N/A;   HERNIA REPAIR  1959   IR FLUORO GUIDE CV LINE RIGHT  03/18/2021   IR US  GUIDE VASC ACCESS RIGHT  03/18/2021   LEFT HEART CATH AND CORONARY ANGIOGRAPHY N/A 04/04/2017   Procedure: Left Heart Cath and Coronary Angiography;  Surgeon: Claudene Victory ORN, MD;  Location: [facility] INVASIVE CV LAB;  Service: Cardiovascular;  Laterality: N/A;   MYOMECTOMY  1980, 2004, 2007   PERIPHERAL VASCULAR THROMBECTOMY  02/21/2022   Procedure: PERIPHERAL VASCULAR THROMBECTOMY;  Surgeon: Sheree Penne Lonni, MD;  Location: [facility] INVASIVE CV LAB;  Service: Cardiovascular;;  Right Tibial   RHINOPLASTY  1971   ROTATOR CUFF REPAIR  2003   SURGICAL REPAIR OF HEMORRHAGE  2015   TONSILLECTOMY  1968     Social History:   reports that she has never smoked. She has never used smokeless tobacco. She reports that she does not drink alcohol and does not use drugs.   Family History:  Her family history includes Alzheimer's disease in her father; Colon cancer in her cousin;  Diabetes in her cousin and maternal aunt; Melanoma in her mother; Psoriasis in her mother. There is no history of Colon polyps, Esophageal cancer, Cancer - Colon, Liver disease, BRCA 1/2, or Breast cancer.   Allergies Allergies  Allergen Reactions   Almond Oil Anaphylaxis   Food Anaphylaxis    Peanuts - anaphylaxis     Gadolinium Derivatives Other (See Comments)    Gadolinium-Containing Contrast Media    Januvia  [Sitagliptin ] Other (See Comments)   Pork-Derived Products Other (See Comments)    Does not eat pork       Home Medications  Prior to Admission medications   Medication Sig Start Date End Date Taking? Authorizing Provider  acetaminophen  (TYLENOL ) 500 MG tablet Take 1,000 mg by mouth 2 (two) times daily as needed (leg and arm pain).   Yes [provider]  acetaminophen  (TYLENOL ) 650 MG CR tablet Take 650 mg by mouth 2 (two) times daily.   Yes [provider]  albuterol  (PROVENTIL ) (2.5 MG/3ML) 0.083% nebulizer solution Use 1 vial (2.5 mg total) by nebulization every 4 (four) hours as needed for wheezing or shortness of breath. 02/14/23 02/14/24 Yes Vann, Jessica U, DO  Amino Acids-Protein Hydrolys (PRO-STAT SUGAR FREE PO) Take 30 mLs by mouth 2 (two) times daily.   Yes [provider]  amitriptyline  (ELAVIL ) 25 MG tablet Take 0.5-1 tablets (12.5-25 mg total) by mouth  at bedtime. Patient taking differently: Take 12.5 mg by mouth at bedtime. 09/11/23  Yes Lovorn, Megan, MD  aspirin  81 MG chewable tablet Chew 81 mg by mouth daily.   Yes [provider]  calcitRIOL  (ROCALTROL ) 0.5 MCG capsule Take 0.5 mcg by mouth See admin instructions. Only takes on Dialysis days Monday,Wednesday,Friday 07/10/23  Yes [provider]  cinacalcet  (SENSIPAR ) 30 MG tablet Take 30 mg by mouth daily.   Yes [provider]  cyclobenzaprine  (FLEXERIL ) 5 MG tablet Take 1 tablet (5 mg total) by mouth 3 (three) times daily as needed for muscle spasms. 11/01/23   Yes Vernon Ranks, MD  diclofenac  Sodium (VOLTAREN ) 1 % GEL Apply 2 g topically 4 (four) times daily. To left wrist 05/26/21  Yes Love, Sharlet RAMAN, PA-C  Glucagon, rDNA, (GLUCAGON EMERGENCY) 1 MG KIT Inject 1 mg into the vein as needed (CBG of 65mg /dL).   Yes [provider]  hydrALAZINE  (APRESOLINE ) 100 MG tablet Take 1 tablet (100 mg total) by mouth 3 (three) times daily. 11/01/23 12/01/23 Yes Vernon Ranks, MD  insulin  aspart (NOVOLOG ) 100 UNIT/ML injection Inject 0-6 Units into the skin 3 (three) times daily with meals. CBG < 70: Implement Hypoglycemia Standing Orders and refer to Hypoglycemia Standing Orders sidebar report CBG 70 - 120: 0 units CBG 121 - 150: 0 units CBG 151 - 200: 1 unit CBG 201-250: 2 units CBG 251-300: 3 units CBG 301-350: 4 units CBG 351-400: 5 units CBG > 400: Give 6 units and call MD 06/29/22  Yes Pokhrel, Laxman, MD  levETIRAcetam  (KEPPRA ) 250 MG tablet Take 1.5 tablets (375 mg total) by mouth every Monday, Wednesday, and Friday at 6 PM. 06/29/22  Yes Pokhrel, Laxman, MD  levETIRAcetam  (KEPPRA ) 750 MG tablet Take 1 tablet (750 mg total) by mouth at bedtime. 06/29/22  Yes Pokhrel, Laxman, MD  loratadine  (CLARITIN ) 10 MG tablet Take 10 mg by mouth daily.   Yes [provider]  losartan  (COZAAR ) 50 MG tablet Take 1 tablet (50 mg total) by mouth 2 (two) times daily. 11/01/23 12/01/23 Yes Pahwani, Ranks, MD  melatonin 5 MG TABS Take 5 mg by mouth at bedtime.   Yes [provider]  mometasone -formoterol  (DULERA ) 200-5 MCG/ACT AERO Inhale 2 puffs into the lungs 2 (two) times daily. 04/15/22  Yes Jarold Medici, MD  pantoprazole  (PROTONIX ) 40 MG tablet Take 1 tablet (40 mg total) by mouth daily. Patient taking differently: Take 40 mg by mouth at bedtime. 02/07/22  Yes Jarold Medici, MD  prochlorperazine  (COMPAZINE ) 10 MG tablet Take 1 tablet (10 mg total) by mouth every 8 (eight) hours as needed for nausea or vomiting. 02/15/23  Yes Danis, Victory LITTIE MOULD, MD   rosuvastatin  (CRESTOR ) 20 MG tablet Take 1 tablet (20 mg total) by mouth daily. Patient taking differently: Take 20 mg by mouth at bedtime. 08/18/21  Yes Ghumman, Ramandeep, NP  sevelamer  carbonate (RENVELA ) 0.8 g PACK packet Take 0.8 g by mouth 3 (three) times daily. 10/19/21  Yes [provider]  ticagrelor  (BRILINTA ) 90 MG TABS tablet Take 1 tablet (90 mg total) by mouth 2 (two) times daily. 06/10/22  Yes Leotis Bogus, MD  aspirin  EC 81 MG tablet Take 1 tablet (81 mg total) by mouth daily. Swallow whole. Patient not taking: Reported on 11/17/2023 06/10/22   Leotis Bogus, MD  gabapentin  (NEURONTIN ) 300 MG capsule Take 1 capsule (300 mg total) by mouth at bedtime. Patient not taking: Reported on 11/17/2023 09/11/23   Lovorn, Megan, MD  hydrocortisone  (ANUSOL -HC) 2.5 % rectal cream Place rectally 4 (four) times daily. Patient not taking: Reported on 11/17/2023 02/14/23   Vann, Jessica U, DO  Iron  Sucrose (VENOFER  IV) Inject 50 mg into the skin once a week. 08/16/23 10/18/24  [provider]  Methoxy PEG-Epoetin  Beta (MIRCERA IJ) Inject 1 Dose into the skin every 14 (fourteen) days. 04/19/23 08/28/24  [provider]  methylphenidate  (RITALIN ) 5 MG tablet Take 1 tablet (5 mg total) by mouth 2 (two) times daily with breakfast and lunch. Patient not taking: Reported on 11/17/2023 09/11/23   Lovorn, Megan, MD     Critical care time: 34 min

## 2023-11-29 NOTE — Procedures (Addendum)
 Patient Name: Carolyn Russo  MRN: [id number]  Epilepsy Attending: Arlin MALVA Krebs  Referring Physician/Provider: Judithe Rocky BROCKS, NP  Date: 11/29/2023 Duration: 23.34 mins  Patient history: 73yo F with ams. EEG to evaluate for seizure  Level of alertness:  comatose  AEDs during EEG study: LEV, propofol   Technical aspects: This EEG study was done with scalp electrodes positioned according to the 10-20 International system of electrode placement. Electrical activity was reviewed with band pass filter of 1-70Hz , sensitivity of 7 uV/mm, display speed of 31mm/sec with a 60Hz  notched filter applied as appropriate. EEG data were recorded continuously and digitally stored.  Video monitoring was available and reviewed as appropriate.  Description: EEG showed continuous generalized polymorphic sharply contoured 3 to 6 Hz theta-delta slowing.  As sedation was adjusted, sharply contoured waves with triphasic morphology at times qasi-periodic  at 1.5 to 2hz  were also noted. Hyperventilation and photic stimulation were not performed.     ABNORMALITY - Continuous slow, generalized  IMPRESSION: This study is suggestive of moderate to severe diffuse encephalopathy.  No seizures were noted.    As sedation was adjusted, sharply contoured waves with triphasic morphology were noted in left frontal region.  These are most likely due to toxic-metabolic causes.  However, if concern for ictal or interictal activity persist, long-term EEG can be considered.   Ruhani Umland O Uno Esau

## 2023-11-29 NOTE — Progress Notes (Signed)
 STROKE TEAM PROGRESS NOTE   SUBJECTIVE (INTERVAL HISTORY) Her RN is at the bedside.  Patient still intubated, on vent, unresponsive.   OBJECTIVE Temp:  [97.9 F (36.6 C)-99.5 F (37.5 C)] 98.7 F (37.1 C) (02/05 1200) Pulse Rate:  [65-93] 85 (02/05 1200) Cardiac Rhythm: Normal sinus rhythm (02/04 2000) Resp:  [7-34] 20 (02/05 1200) BP: (96-185)/(33-69) 184/69 (02/05 1212) SpO2:  [96 %-100 %] 99 % (02/05 1200) Arterial Line BP: (85-189)/(28-95) 165/60 (02/05 1200) FiO2 (%):  [30 %-40 %] 30 % (02/05 1212) Weight:  [70 kg] 70 kg (02/05 0500)  Recent Labs  Lab 11/28/23 1937 11/28/23 2344 11/29/23 0437 11/29/23 0824 11/29/23 1208  GLUCAP 130* 108* 157* 147* 74   Recent Labs  Lab 11/25/23 0320 11/25/23 1105 11/25/23 1736 11/26/23 0200 11/26/23 1839 11/27/23 0516 11/28/23 0442 11/29/23 0511  NA 135 134*  --  134*  --  133* 134* 131*  K 3.2* 3.1*  --  4.4  --  4.1 4.8 4.0  CL 95*  --   --  96*  --  97* 94* 92*  CO2 22  --   --  20*  --  25 25 28   GLUCOSE 116*  --   --  232*  --  133* 180* 151*  BUN 37*  --   --  47*  --  26* 36* 25*  CREATININE 6.76*  --   --  7.45*  --  5.00* 6.19* 4.24*  CALCIUM  8.3*  --   --  8.6*  --  8.4* 8.4* 8.6*  MG 1.7  --    < > 2.0 1.7 1.7 2.4 2.3  PHOS 3.4  --    < > 4.0 2.7 2.6 2.3* 2.2*   < > = values in this interval not displayed.   Recent Labs  Lab 11/24/23 0434 11/26/23 0200 11/29/23 0511  AST 40  --   --   ALT 69*  --   --   ALKPHOS 80  --   --   BILITOT 1.5*  --   --   PROT 6.5  --   --   ALBUMIN  3.0* 2.6* 2.1*   Recent Labs  Lab 11/25/23 0320 11/25/23 1105 11/26/23 0200 11/27/23 0516 11/28/23 0442 11/29/23 0511  WBC 12.9*  --  22.0* 21.3* 21.0* 20.1*  NEUTROABS  --   --  18.5*  --   --   --   HGB 10.4* 11.6* 10.5* 9.5* 9.2* 8.7*  HCT 29.6* 34.0* 30.7* 27.3* 26.4* 25.2*  MCV 83.4  --  87.2 85.8 85.7 86.3  PLT 222  --  218 225 268 303   No results for input(s): CKTOTAL, CKMB, CKMBINDEX, TROPONINI in the  last 168 hours. No results for input(s): LABPROT, INR in the last 72 hours. No results for input(s): COLORURINE, LABSPEC, PHURINE, GLUCOSEU, HGBUR, BILIRUBINUR, KETONESUR, PROTEINUR, UROBILINOGEN, NITRITE, LEUKOCYTESUR in the last 72 hours.  Invalid input(s): APPERANCEUR     Component Value Date/Time   CHOL 90 11/17/2023 0349   CHOL 96 (L) 04/13/2022 1526   TRIG 103 11/29/2023 0511   HDL 38 (L) 11/17/2023 0349   HDL 34 (L) 04/13/2022 1526   CHOLHDL 2.4 11/17/2023 0349   VLDL 15 11/17/2023 0349   LDLCALC 37 11/17/2023 0349   LDLCALC 39 04/13/2022 1526   Lab Results  Component Value Date   HGBA1C 5.8 (H) 10/26/2023   No results found for: LABOPIA, COCAINSCRNUR, LABBENZ, AMPHETMU, THCU, LABBARB  No results for input(s): ETH in  the last 168 hours.  I have personally reviewed the radiological images below and agree with the radiology interpretations.  EEG adult Result Date: 11/29/2023 Shelton Arlin KIDD, MD     11/29/2023  1:33 PM Patient Name: Carolyn Russo MRN: [id number] Epilepsy Attending: Arlin KIDD Shelton Referring Physician/Provider: Judithe Rocky BROCKS, NP Date: 11/29/2023 Duration: 23.34 mins Patient history: 73yo F with ams. EEG to evaluate for seizure Level of alertness:  comatose AEDs during EEG study: LEV, propofol  Technical aspects: This EEG study was done with scalp electrodes positioned according to the 10-20 International system of electrode placement. Electrical activity was reviewed with band pass filter of 1-70Hz , sensitivity of 7 uV/mm, display speed of 81mm/sec with a 60Hz  notched filter applied as appropriate. EEG data were recorded continuously and digitally stored.  Video monitoring was available and reviewed as appropriate. Description: EEG showed continuous generalized polymorphic sharply contoured 3 to 6 Hz theta-delta slowing.  As sedation was adjusted, sharply contoured waves with triphasic morphology at times qasi-periodic  at 1.5 to  2hz  were also noted. Hyperventilation and photic stimulation were not performed.   ABNORMALITY - Continuous slow, generalized IMPRESSION: This study is suggestive of moderate to severe diffuse encephalopathy.  No seizures were noted.  As sedation was adjusted, sharply contoured waves with triphasic morphology were noted in left frontal region.  These are most likely due to toxic-metabolic causes.  However, if concern for ictal or interictal activity persist, long-term EEG can be considered.  Arlin KIDD Shelton   MR BRAIN WO CONTRAST Result Date: 11/29/2023 CLINICAL DATA:  73 year old female with respiratory failure, right PICA infarct suspected on MRI last month with abnormal distal right vertebral artery flow void at that time. EXAM: MRI HEAD WITHOUT CONTRAST TECHNIQUE: Multiplanar, multiecho pulse sequences of the brain and surrounding structures were obtained without intravenous contrast. COMPARISON:  Head CT yesterday. Brain MRI and intracranial MRA 11/17/2023. FINDINGS: Brain: Previously seen right cerebellar tonsillar abnormal diffusion has resolved, T2 and FLAIR hyperintensity there has regressed (series 11, image 8) with no associated hemorrhage or mass effect. Contralateral new left central cerebellar, cerebellar peduncle region diffusion restriction (series 5, image 59) with patchy T2 and FLAIR hyperintensity. Nearby chronic microhemorrhage on SWI appears unchanged (series 12, image 19). No acute hemorrhage or mass effect. Other regional brainstem T2 and FLAIR heterogeneity is chronic and stable, in association with right midbrain flow layering in degeneration. No other restricted diffusion. No midline shift, mass effect, evidence of mass lesion, ventriculomegaly, extra-axial collection or acute intracranial hemorrhage. Cervicomedullary junction and pituitary are within normal limits. Occasional chronic microhemorrhages in the supratentorial brain and midbrain are stable. Chronic encephalomalacia in the  right deep gray nuclei and midbrain is stable. And confluent additional bilateral cerebral white matter T2 and FLAIR hyperintensity is stable. Small chronic cortically based infarct with encephalomalacia in the posterior right temporal lobe is stable. Vascular: Major intracranial vascular flow voids are detected including the distal right vertebral artery although diminutive. Skull and upper cervical spine: Chronic cervical spine degeneration but nonspecific abnormally decreased C6 vertebral body marrow signal is partially visible on series 9, image 12 (not included on January MRI). Other visible marrow signal appears stable from January and within normal limits. On recent chest CTA 11/16/2023 the C6 and C7 vertebral bodies appeared partially collapsed or eroded. Sinuses/Orbits: Regressed paranasal sinus fluid levels and inflammation. Stable orbits. Other: Bilateral mastoid air cell fluid has progressed since January. And there are trace layering secretions in the visible nasopharynx. Patient is intubated. IMPRESSION:  1. Resolution of small right PICA cerebellar infarct seen in January. New and Acute central left Cerebellar Infarct with no associated hemorrhage or mass effect. 2. Otherwise stable underlying advanced chronic ischemic disease with some Wallerian degeneration and occasional chronic microhemorrhages. 3. Abnormal but nonspecific partially visible bone marrow signal of the C6 vertebral body. Is there a known malignancy? Alternatively this might be discitis osteomyelitis related, with subtle partial erosions/collapse of the C6 and C7 vertebral bodies on January chest CTA. Dedicated cerVical spine MRI (without and with contrast preferred) may characterize further. 4. Progressed mastoid air cell effusions in the setting of intubation. Electronically Signed   By: VEAR Hurst M.D.   On: 11/29/2023 04:39   ECHOCARDIOGRAM LIMITED Result Date: 11/28/2023    ECHOCARDIOGRAM LIMITED REPORT   Patient Name:   Carolyn Russo  [facility] Date of Exam: 11/28/2023 Medical Rec #:  [id number]      Height:       63.0 in Accession #:    [id number]     Weight:       156.5 lb Date of Birth:  [birth year]      BSA:          1.742 m Patient Age:    73 years       BP:           159/50 mmHg Patient Gender: F              HR:           84 bpm. Exam Location:  Inpatient Procedure: Limited Echo, Cardiac Doppler and Limited Color Doppler Indications:    Cardiac Arrest I46.9  History:        Patient has prior history of Echocardiogram examinations, most                 recent 11/17/2023.  Sonographer:    Tinnie Gosling RDCS Referring Phys: [id number] TIFFANY Taunton IMPRESSIONS  1. There is prominent infrerior wall pseudodyskinesis due to increased infradiaphragmatic pressure (distended stomach?). Left ventricular ejection fraction, by estimation, is 55 to 60%. The left ventricle has normal function. There is mild concentric left ventricular hypertrophy.  2. Right ventricular systolic function is normal. The right ventricular size is normal. Tricuspid regurgitation signal is inadequate for assessing PA pressure.  3. Left atrial size was mildly dilated.  4. The mitral valve is normal in structure. Trivial mitral valve regurgitation. No evidence of mitral stenosis.  5. The aortic valve is tricuspid. There is mild thickening of the aortic valve. Aortic valve regurgitation is not visualized. Aortic valve sclerosis is present, with no evidence of aortic valve stenosis.  6. The inferior vena cava is dilated in size with <50% respiratory variability, suggesting right atrial pressure of 15 mmHg. Comparison(s): Prior images reviewed side by side. There is inferior wall pseudodyskinesis, but no new true wall motion abnormality. FINDINGS  Left Ventricle: There is prominent infrerior wall pseudodyskinesis due to increased infradiaphragmatic pressure (distended stomach?). Left ventricular ejection fraction, by estimation, is 55 to 60%. The left ventricle has normal function. The  left ventricular internal cavity size was normal in size. There is mild concentric left ventricular hypertrophy. Right Ventricle: The right ventricular size is normal. Right ventricular systolic function is normal. Tricuspid regurgitation signal is inadequate for assessing PA pressure. Left Atrium: Left atrial size was mildly dilated. Right Atrium: Right atrial size was normal in size. Pericardium: There is no evidence of pericardial effusion. Mitral Valve: The mitral valve is normal in structure. Trivial mitral  valve regurgitation. No evidence of mitral valve stenosis. Aortic Valve: The aortic valve is tricuspid. There is mild thickening of the aortic valve. Aortic valve regurgitation is not visualized. Aortic valve sclerosis is present, with no evidence of aortic valve stenosis. Venous: IVC assessment for right atrial pressure unable to be performed due to mechanical ventilation. The inferior vena cava is dilated in size with less than 50% respiratory variability, suggesting right atrial pressure of 15 mmHg. LEFT VENTRICLE PLAX 2D LVIDd:         4.60 cm LVIDs:         2.90 cm LV PW:         1.30 cm LV IVS:        1.30 cm  IVC IVC diam: 2.40 cm Mihai Croitoru MD Electronically signed by Jerel Balding MD Signature Date/Time: 11/28/2023/3:57:52 PM    Final    CT HEAD WO CONTRAST ( ) Result Date: 11/28/2023 CLINICAL DATA:  Anoxic brain injury EXAM: CT HEAD WITHOUT CONTRAST TECHNIQUE: Contiguous axial images were obtained from the base of the skull through the vertex without intravenous contrast. RADIATION DOSE REDUCTION: This exam was performed according to the departmental dose-optimization program which includes automated exposure control, adjustment of the mA and/or kV according to patient size and/or use of iterative reconstruction technique. COMPARISON:  11/16/2023 FINDINGS: Brain: No evidence of acute infarction, hemorrhage, mass, mass effect, or midline shift. No hydrocephalus or extra-axial fluid  collection. Gray-white differentiation is preserved. The basal cisterns are patent. Periventricular white matter changes, likely the sequela of chronic small vessel ischemic disease. Remote infarcts in the right basal ganglia, right thalamus, and left cerebellum Vascular: No hyperdense vessel. Skull: Negative for fracture or focal lesion. Sinuses/Orbits: Mucosal thickening in the ethmoid air cells and right maxillary sinus. Sequela prior endoscopic sinus surgery. No acute finding in the orbits. Status post bilateral lens replacements. Other: Fluid in the right-greater-than-left mastoid air cells, which may be related to intubation. IMPRESSION: No acute intracranial process. Electronically Signed   By: Donald Campion M.D.   On: 11/28/2023 13:46   DG CHEST PORT 1 VIEW Result Date: 11/28/2023 CLINICAL DATA:  Acute hypoxic respiratory failure EXAM: PORTABLE CHEST 1 VIEW COMPARISON:  11/26/2023 FINDINGS: Endotracheal tube slightly advanced, now 2 cm above the carina. Right dialysis catheter and NG tube remain in place, unchanged. Cardiomegaly with vascular congestion. Interstitial prominence slightly increase could reflect early interstitial edema. Left base atelectasis, similar prior study. No visible effusions or acute bony abnormality. IMPRESSION: Cardiomegaly, vascular congestion. Question early interstitial edema. Left base atelectasis. Electronically Signed   By: Franky Crease M.D.   On: 11/28/2023 12:06   DG CHEST PORT 1 VIEW Result Date: 11/26/2023 CLINICAL DATA:  [id number] Sepsis [facility]) [id number] EXAM: PORTABLE CHEST 1 VIEW COMPARISON:  11/23/2023 FINDINGS: ET tube terminates 4.6 cm above the carina. Right IJ hemodialysis catheter remains positioned at the level of the right atrium. Stable heart size. Aortic atherosclerosis. Similar minor bibasilar atelectasis. No new airspace consolidations. No pleural effusion or pneumothorax. IMPRESSION: 1. ET tube terminates 4.6 cm above the carina. 2. Similar minor bibasilar  atelectasis. Electronically Signed   By: Mabel Converse D.O.   On: 11/26/2023 13:52   DG Abd Portable 1V Result Date: 11/23/2023 CLINICAL DATA:  Tube placement EXAM: PORTABLE ABDOMEN - 1 VIEW limited for tube placement COMPARISON:  CT 10/24/2023 FINDINGS: Limited x-ray for tube placement has enteric tube overlying the distal stomach. Stomach is mildly distended with fluid. Minimal bowel gas elsewhere in the visualized abdomen.  There is contrast in the colon. Patient had recent swallowing study. Right femoral line also noted at the edge of the imaging field. Curvature and degenerative changes along the spine. IMPRESSION: Enteric tube with tip overlying the distal stomach.  Limited x-ray Electronically Signed   By: Ranell Bring M.D.   On: 11/23/2023 18:11   DG CHEST PORT 1 VIEW Result Date: 11/23/2023 CLINICAL DATA:  Respiratory failure and status post intubation. EXAM: PORTABLE CHEST 1 VIEW COMPARISON:  11/17/2023 FINDINGS: Endotracheal tube now present with the tip approximately 1.7 cm above the carina. Stable appearance of tunneled dialysis catheter. Pacing pad over the chest. Some improved aeration of both lungs with mild bibasilar atelectasis remaining, left greater than right. No overt pulmonary edema, significant pleural fluid or pneumothorax. IMPRESSION: Endotracheal tube tip approximately 1.7 cm above the carina. Some improved aeration of both lungs with mild bibasilar atelectasis remaining, left greater than right. Electronically Signed   By: Marcey Moan M.D.   On: 11/23/2023 08:27   DG Swallowing Func-Speech Pathology Result Date: 11/21/2023 Table formatting from the original result was not included. Modified Barium Swallow Study Patient Details Name: JAYDENCE VANYO MRN: [id number] Date of Birth: 1951-05-13 Today's Date: 11/21/2023 HPI/PMH: HPI: Patient is a 73 y.o. female with PMH: CVA in 2022 and 2023, right BKA, HTN, DM-2, HLD, ESRD on HD MWF, asthma, dysphagia, CHF, heart murmur, seizure.  She presented to the hospital from SNF on 11/16/23 for chest pain, weakness and confusion. CT head revealed infarct in inferior right cerebellum which was new since 06/12/22. MRI brain with acute right inferomedial cerebellar ischemic infarction in the right PICA territory. Pt also diagnosed with Covid/RSV. Clinical Impression: Clinical Impression: Pt exhibits mild oropharyngeal dysphagia characterized by lingual weakness and impaired sensation. She initiates a swallow response which clears the majority of each bolus from her oral cavity, but does not clear it completely. Pt does not achieve complete BOT retraction and the oral residue progresses through the pharynx to collect in the pyriform sinuses without sensation. All liquid consistencies enters the open laryngeal vestibule, where they are eventually cleared with a cued subsequent swallow or cough (PAS 3). Residue increases with thicker residue, increasing the risk of airway invasion. Of note, pt coughed multiple times throughout the study in the absence of penetration/aspiration. Cueing the pt to immediately initiate a second swallow helped clear the oropharyngeal residue promptly. Biofeedback was used to demonstrate effectiveness of subswallows on function with pt demonstrating understanding. Recommend continuing current diet of Dys 2 solids and thin liquids. SLP will f/u to reinforce use of compensatory strategies. DIGEST Swallow Severity Rating*  Safety: 1  Efficiency: 1  Overall Pharyngeal Swallow Severity: mild 1: mild; 2: moderate; 3: severe; 4: profound *The Dynamic Imaging Grade of Swallowing Toxicity is standardized for the head and neck cancer population, however, demonstrates promising clinical applications across populations to standardize the clinical rating of pharyngeal swallow safety and severity. Factors that may increase risk of adverse event in presence of aspiration Noe & Lianne 2021): Factors that may increase risk of adverse event in  presence of aspiration Noe & Lianne 2021): Reduced cognitive function Recommendations/Plan: Swallowing Evaluation Recommendations Swallowing Evaluation Recommendations Recommendations: PO diet PO Diet Recommendation: Dysphagia 2 (Finely chopped); Thin liquids (Level 0) Liquid Administration via: Cup; Straw Medication Administration: Whole meds with puree Supervision: Staff to assist with self-feeding; Full supervision/cueing for swallowing strategies Swallowing strategies  : Minimize environmental distractions; Slow rate; Small bites/sips; Multiple dry swallows after each bite/sip Postural changes: Position  pt fully upright for meals; Stay upright 30-60 min after meals Oral care recommendations: Oral care BID (2x/day) Treatment Plan Treatment Plan Treatment recommendations: Therapy as outlined in treatment plan below Follow-up recommendations: Skilled nursing-short term rehab (<3 hours/day) Functional status assessment: Patient has had a recent decline in their functional status and demonstrates the ability to make significant improvements in function in a reasonable and predictable amount of time. Treatment frequency: Min 2x/week Treatment duration: 1 week Interventions: Aspiration precaution training; Compensatory techniques; Patient/family education Recommendations Recommendations for follow up therapy are one component of a multi-disciplinary discharge planning process, led by the attending physician.  Recommendations may be updated based on patient status, additional functional criteria and insurance authorization. Assessment: Orofacial Exam: Orofacial Exam Oral Cavity: Oral Hygiene: WFL Oral Cavity - Dentition: Adequate natural dentition Orofacial Anatomy: WFL Oral Motor/Sensory Function: WFL Anatomy: Anatomy: Suspected cervical osteophytes Boluses Administered: Boluses Administered Boluses Administered: Thin liquids (Level 0); Mildly thick liquids (Level 2, nectar thick); Moderately thick liquids (Level  3, honey thick); Puree; Solid  Oral Impairment Domain: Oral Impairment Domain Lip Closure: No labial escape Tongue control during bolus hold: Posterior escape of greater than half of bolus Bolus preparation/mastication: Timely and efficient chewing and mashing Bolus transport/lingual motion: Brisk tongue motion Oral residue: Residue collection on oral structures Location of oral residue : Tongue Initiation of pharyngeal swallow : Pyriform sinuses  Pharyngeal Impairment Domain: Pharyngeal Impairment Domain Soft palate elevation: No bolus between soft palate (SP)/pharyngeal wall (PW) Laryngeal elevation: Complete superior movement of thyroid  cartilage with complete approximation of arytenoids to epiglottic petiole Anterior hyoid excursion: Partial anterior movement Epiglottic movement: Complete inversion Laryngeal vestibule closure: Complete, no air/contrast in laryngeal vestibule Pharyngeal stripping wave : Present - complete Pharyngeal contraction (A/P view only): N/A Pharyngoesophageal segment opening: Complete distension and complete duration, no obstruction of flow Tongue base retraction: Narrow column of contrast or air between tongue base and PPW Pharyngeal residue: Collection of residue within or on pharyngeal structures Location of pharyngeal residue: Valleculae; Aryepiglottic folds; Pyriform sinuses  Esophageal Impairment Domain: No data recorded Pill: No data recorded Penetration/Aspiration Scale Score: Penetration/Aspiration Scale Score 1.  Material does not enter airway: Solid 2.  Material enters airway, remains ABOVE vocal cords then ejected out: Moderately thick liquids (Level 3, honey thick); Puree 3.  Material enters airway, remains ABOVE vocal cords and not ejected out: Thin liquids (Level 0); Mildly thick liquids (Level 2, nectar thick) Compensatory Strategies: Compensatory Strategies Compensatory strategies: Yes Straw: Effective Effective Straw: Thin liquid (Level 0); Mildly thick liquid (Level 2,  nectar thick) Chin tuck: Effective Effective Chin Tuck: Thin liquid (Level 0)   General Information: Caregiver present: No  Diet Prior to this Study: Thin liquids (Level 0); Dysphagia 2 (finely chopped)   Temperature : Normal   Respiratory Status: WFL   Supplemental O2: Nasal cannula   History of Recent Intubation: No  Behavior/Cognition: Alert; Cooperative; Pleasant mood; Confused Self-Feeding Abilities: Needs assist with self-feeding Baseline vocal quality/speech: Normal Volitional Cough: Able to elicit Volitional Swallow: Able to elicit Exam Limitations: No limitations Goal Planning: Prognosis for improved oropharyngeal function: Good Barriers to Reach Goals: Cognitive deficits; Time post onset No data recorded Patient/Family Stated Goal: none stated Consulted and agree with results and recommendations: Patient Pain: Pain Assessment Pain Assessment: Faces Faces Pain Scale: 2 Pain Location: chest and back Pain Descriptors / Indicators: Aching; Discomfort Pain Intervention(s): Monitored during session End of Session: Start Time:SLP Start Time (ACUTE ONLY): 1511 Stop Time: SLP Stop Time (ACUTE ONLY):  1541 Time Calculation:SLP Time Calculation (min) (ACUTE ONLY): 30 min Charges: SLP Evaluations $ SLP Speech Visit: 1 Visit SLP Evaluations $MBS Swallow: 1 Procedure $Swallowing Treatment: 1 Procedure $Speech Treatment for Individual: 1 Procedure SLP visit diagnosis: SLP Visit Diagnosis: Dysphagia, oropharyngeal phase (R13.12) Past Medical History: Past Medical History: Diagnosis Date  Acute GI bleeding   Allergy   Anemia   Anterior chest wall pain   Appendicitis 1965  Asthma   Body mass index 37.0-37.9, adult   Breast pain   Cataract   both eyes  CHF (congestive heart failure) (HCC)   Cognitive change 04/20/2021  r/t cva 03/2821  Complication of anesthesia   Memory loss after general  Dehydration 2014  Deviated septum 1971  Diabetes mellitus   Dysphagia due to old stroke   easy to get strangled when eating  Dyspnea 2014   ESRD on hemodialysis (HCC)   MWF at [facility]  Extrinsic asthma   WITH ASTHMA ATTACK  Fibroid 1980  GERD (gastroesophageal reflux disease)   Heart murmur   History of migraine   History of seizure   with stroke  Hx gestational diabetes   Hyperlipidemia   Hypertension 2014  Inguinal hernia 1959  Malaise and fatigue 2014  Non-IgE mediated allergic asthma 2014  Obesity   Pelvic pain   Pregnancy, high-risk 1985  Stroke (HCC) 04/20/2021  (CVA) of right basal ganglia  Tonsillitis 1968  Uterine fibroid 1980  Visual field defect   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 Past Surgical History: Past Surgical History: Procedure Laterality Date  ABDOMINAL AORTOGRAM W/LOWER EXTREMITY N/A 02/21/2022  Procedure: ABDOMINAL AORTOGRAM W/LOWER EXTREMITY;  Surgeon: Sheree Penne Bruckner, MD;  Location: [facility] INVASIVE CV LAB;  Service: Cardiovascular;  Laterality: N/A;  AMPUTATION Right 05/18/2022  Procedure: RIGHT BELOW KNEE AMPUTATION;  Surgeon: Harden Jerona GAILS, MD;  Location: [facility] OR;  Service: Orthopedics;  Laterality: Right;  AMPUTATION TOE Right 04/15/2022  Procedure: AMPUTATION  LST TOERIGHT FOOT;  Surgeon: Silva Juliene SAUNDERS, DPM;  Location: [facility] ORS;  Service: Podiatry;  Laterality: Right;  APPENDECTOMY  1959  BASCILIC VEIN TRANSPOSITION Right 04/30/2021  Procedure: RIGHT FIRST STAGE BASCILIC VEIN TRANSPOSITION;  Surgeon: Eliza Bruckner RAMAN, MD;  Location: [facility] OR;  Service: Vascular;  Laterality: Right;  CESAREAN SECTION  1985  COLONOSCOPY    ESOPHAGOGASTRODUODENOSCOPY (EGD) WITH PROPOFOL  N/A 04/18/2021  Procedure: ESOPHAGOGASTRODUODENOSCOPY (EGD) WITH PROPOFOL ;  Surgeon: Avram Lupita BRAVO, MD;  Location: [facility] ENDOSCOPY;  Service: Endoscopy;  Laterality: N/A;  EYE SURGERY    bilateral cataract   FLEXIBLE SIGMOIDOSCOPY N/A 04/18/2021  Procedure: FLEXIBLE SIGMOIDOSCOPY;  Surgeon: Avram Lupita BRAVO, MD;  Location: [facility] ENDOSCOPY;  Service: Endoscopy;  Laterality: N/A;  HERNIA REPAIR  1959  IR FLUORO GUIDE CV LINE RIGHT  03/18/2021  IR US  GUIDE VASC ACCESS RIGHT   03/18/2021  LEFT HEART CATH AND CORONARY ANGIOGRAPHY N/A 04/04/2017  Procedure: Left Heart Cath and Coronary Angiography;  Surgeon: Claudene Victory ORN, MD;  Location: [facility] INVASIVE CV LAB;  Service: Cardiovascular;  Laterality: N/A;  MYOMECTOMY  1980, 2004, 2007  PERIPHERAL VASCULAR THROMBECTOMY  02/21/2022  Procedure: PERIPHERAL VASCULAR THROMBECTOMY;  Surgeon: Sheree Penne Bruckner, MD;  Location: [facility] INVASIVE CV LAB;  Service: Cardiovascular;;  Right Tibial  RHINOPLASTY  1971  ROTATOR CUFF REPAIR  2003  SURGICAL REPAIR OF HEMORRHAGE  2015  TONSILLECTOMY  1968 Damien Blumenthal, M.A., CF-SLP Speech Language Pathology, Acute Rehabilitation Services Secure Chat preferred [phone] 11/21/2023, 4:24 PM  VAS US  CAROTID Result Date: 11/20/2023 Carotid Arterial Duplex Study Patient  Name:  Carolyn Russo [facility]  Date of Exam:   11/19/2023 Medical Rec #: [id number]       Accession #:    [id number] Date of Birth: 02-13-1951       Patient Gender: F Patient Age:   33 years Exam Location:  [facility] Procedure:      VAS US  CAROTID Referring Phys: ELVAN SOR --------------------------------------------------------------------------------  Indications:       CVA. Risk Factors:      Hypertension, hyperlipidemia, Diabetes. Other Factors:     Covid and RSV positive. ESRD, on dialysis. Right BKA. Seizure                    disorder. Limitations        Today's exam was limited due to the patient's respiratory                    variation and the patient's inability or unwillingness to                    cooperate. Comparison Study:  Prior carotid duplex done 06/04/22 indicated bilateral 1-39%                    ICA stenosis Performing Technologist: Alberta Lis RVS  Examination Guidelines: A complete evaluation includes B-mode imaging, spectral Doppler, color Doppler, and power Doppler as needed of all accessible portions of each vessel. Bilateral testing is considered an integral part of a complete examination. Limited examinations for  reoccurring indications may be performed as noted.  Right Carotid Findings: +----------+--------+--------+--------+------------------+--------+           PSV cm/sEDV cm/sStenosisPlaque DescriptionComments +----------+--------+--------+--------+------------------+--------+ CCA Distal77      14                                         +----------+--------+--------+--------+------------------+--------+ ECA       75      10                                         +----------+--------+--------+--------+------------------+--------+ Unable to adequately visualize vessels secondary to noncooperative patient Left Carotid Findings: +----------+--------+--------+--------+------------------+------------------+           PSV cm/sEDV cm/sStenosisPlaque DescriptionComments           +----------+--------+--------+--------+------------------+------------------+ CCA Prox  97      11                                intimal thickening +----------+--------+--------+--------+------------------+------------------+ CCA Distal67      11                                intimal thickening +----------+--------+--------+--------+------------------+------------------+ ICA Prox  99      19              heterogenous                         +----------+--------+--------+--------+------------------+------------------+ ICA Mid   96      22                                                   +----------+--------+--------+--------+------------------+------------------+  ICA Distal78      19                                                   +----------+--------+--------+--------+------------------+------------------+ ECA       83      9                                                    +----------+--------+--------+--------+------------------+------------------+ +----------+--------+--------+--------+-------------------+           PSV cm/sEDV cm/sDescribeArm Pressure (mmHG)  +----------+--------+--------+--------+-------------------+ [id number]                                         +----------+--------+--------+--------+-------------------+ +---------+--------+--+--------+--+ VertebralPSV cm/s66EDV cm/s10 +---------+--------+--+--------+--+   Summary: Right Carotid: Unable to assess. Left Carotid: Velocities in the left ICA are consistent with a 1-39% stenosis. Vertebrals:  Left vertebral artery demonstrates antegrade flow. Subclavians: Normal flow hemodynamics were seen in the left subclavian artery. *See table(s) above for measurements and observations.  Electronically signed by Eather Popp MD on 11/20/2023 at 2:01:25 PM.    Final    ECHOCARDIOGRAM LIMITED Result Date: 11/17/2023    ECHOCARDIOGRAM LIMITED REPORT   Patient Name:   Carolyn Russo [facility] Date of Exam: 11/17/2023 Medical Rec #:  [id number]      Height:       63.0 in Accession #:    [id number]     Weight:       175.3 lb Date of Birth:  01-25-1951      BSA:          1.828 m Patient Age:    73 years       BP:           154/88 mmHg Patient Gender: F              HR:           93 bpm. Exam Location:  Inpatient Procedure: Limited Echo, Limited Color Doppler and Cardiac Doppler Indications:    stroke  History:        Patient has prior history of Echocardiogram examinations, most                 recent 10/25/2023. CAD, end stage renal disease; Risk                 Factors:Hypertension.  Sonographer:    Tinnie Barefoot RDCS Referring Phys: [id number] [facility] GOEL IMPRESSIONS  1. Left ventricular ejection fraction, by estimation, is 55 to 60%. The left ventricle has normal function. The left ventricle has no regional wall motion abnormalities. Elevated left ventricular end-diastolic pressure.  2. Right ventricular systolic function is normal. The right ventricular size is normal. There is severely elevated pulmonary artery systolic pressure.  3. The mitral valve is normal in structure. Trivial mitral valve regurgitation. No  evidence of mitral stenosis.  4. The aortic valve is tricuspid. There is mild calcification of the aortic valve. Aortic valve regurgitation is not visualized. No aortic stenosis is present. Aortic valve mean gradient measures 11.0 mmHg. Aortic valve Vmax measures 2.16 m/s.  5. The  inferior vena cava is dilated in size with <50% respiratory variability, suggesting right atrial pressure of 15 mmHg. FINDINGS  Left Ventricle: Left ventricular ejection fraction, by estimation, is 55 to 60%. The left ventricle has normal function. The left ventricle has no regional wall motion abnormalities. The left ventricular internal cavity size was normal in size. There is  no left ventricular hypertrophy. Elevated left ventricular end-diastolic pressure. Right Ventricle: The right ventricular size is normal. No increase in right ventricular wall thickness. Right ventricular systolic function is normal. There is severely elevated pulmonary artery systolic pressure. The tricuspid regurgitant velocity is 3.47 m/s, and with an assumed right atrial pressure of 15 mmHg, the estimated right ventricular systolic pressure is 63.2 mmHg. Left Atrium: Left atrial size was normal in size. Right Atrium: Right atrial size was normal in size. Pericardium: There is no evidence of pericardial effusion. Mitral Valve: The mitral valve is normal in structure. Mild mitral annular calcification. Trivial mitral valve regurgitation. No evidence of mitral valve stenosis. Tricuspid Valve: The tricuspid valve is normal in structure. Tricuspid valve regurgitation is mild . No evidence of tricuspid stenosis. Aortic Valve: The aortic valve is tricuspid. There is mild calcification of the aortic valve. Aortic valve regurgitation is not visualized. No aortic stenosis is present. Aortic valve mean gradient measures 11.0 mmHg. Aortic valve peak gradient measures 18.7 mmHg. Pulmonic Valve: The pulmonic valve was normal in structure. Pulmonic valve regurgitation is not  visualized. No evidence of pulmonic stenosis. Aorta: The aortic root is normal in size and structure. Venous: The inferior vena cava is dilated in size with less than 50% respiratory variability, suggesting right atrial pressure of 15 mmHg. IAS/Shunts: No atrial level shunt detected by color flow Doppler. Additional Comments: Spectral Doppler performed. Color Doppler performed.   Diastology LV e' medial:    5.55 cm/s LV E/e' medial:  22.2 LV e' lateral:   8.92 cm/s LV E/e' lateral: 13.8  IVC IVC diam: 2.40 cm AORTIC VALVE AV Vmax:           216.00 cm/s AV Vmean:          152.000 cm/s AV VTI:            0.407 m AV Peak Grad:      18.7 mmHg AV Mean Grad:      11.0 mmHg LVOT Vmax:         110.00 cm/s LVOT Vmean:        73.700 cm/s LVOT VTI:          0.210 m LVOT/AV VTI ratio: 0.52  AORTA Ao Asc diam: 2.80 cm MITRAL VALVE                TRICUSPID VALVE MV Area (PHT): 4.80 cm     TR Peak grad:   48.2 mmHg MV Decel Time: 158 msec     TR Vmax:        347.00 cm/s MV E velocity: 123.00 cm/s MV A velocity: 119.00 cm/s  SHUNTS MV E/A ratio:  1.03         Systemic VTI: 0.21 m Annabella Scarce MD Electronically signed by Annabella Scarce MD Signature Date/Time: 11/17/2023/6:14:59 PM    Final    MR ANGIO HEAD WO CONTRAST Result Date: 11/17/2023 CLINICAL DATA:  Neuro deficit, acute, stroke suspected. Right PICA infarct and abnormal appearance of the distal right vertebral artery on MRI. EXAM: MRA HEAD WITHOUT CONTRAST TECHNIQUE: Angiographic images of the Circle of Willis were acquired using MRA technique  without intravenous contrast. COMPARISON:  Head MRI 11/16/2023. Head and neck CTA 06/22/2022. Head MRA 06/03/2022. FINDINGS: The study is motion degraded, moderately so towards the skull base Anterior circulation: The internal carotid arteries are widely patent from skull base to carotid termini. ACAs and MCAs are patent without evidence of a proximal branch occlusion or significant A1 or M1 stenosis. No aneurysm is  identified. Posterior circulation: The included portion of the intracranial left vertebral artery is widely patent and dominant. The included intracranial right vertebral artery is also patent but is diffusely small distally which reflects a change from the prior CTA. Patent AICA and SCA origins are visualized bilaterally. The basilar artery is patent with decreased signal through its mid to distal portions attributed to motion artifact based on source images. There are large posterior communicating arteries bilaterally. The PCAs are patent with severe bilateral P2 stenoses, likely progressed. No aneurysm is identified. Anatomic variants: None. IMPRESSION: 1. Motion degraded examination. 2. No large vessel occlusion. 3. Severe bilateral P2 stenoses. 4. Patent but diffusely narrowed distal right vertebral artery. Electronically Signed   By: Dasie Hamburg M.D.   On: 11/17/2023 17:09   EEG adult Result Date: 11/17/2023 Shelton Arlin KIDD, MD     11/17/2023 10:44 AM Patient Name: Carolyn Russo MRN: [id number] Epilepsy Attending: Arlin KIDD Shelton Referring Physician/Provider: Merrianne Locus, MD Date: 11/17/2023 Duration: 23.09 mins Patient history: 73 year old female with history of seizures who presented with altered mental status.  EEG to evaluate for seizure. Level of alertness: Awake AEDs during EEG study: LEV Technical aspects: This EEG study was done with scalp electrodes positioned according to the 10-20 International system of electrode placement. Electrical activity was reviewed with band pass filter of 1-70Hz , sensitivity of 7 uV/mm, display speed of 110mm/sec with a 60Hz  notched filter applied as appropriate. EEG data were recorded continuously and digitally stored.  Video monitoring was available and reviewed as appropriate. Description: The posterior dominant rhythm consists of 8 Hz activity of moderate voltage (25-35 uV) seen predominantly in posterior head regions, symmetric and reactive to eye opening and  eye closing. EEG showed intermittent generalized 3 to 6 Hz theta-delta slowing.spikes were noted in right temporoparietal region. Hyperventilation and photic stimulation were not performed.   Of note, study was technically difficult due to significant movement artifact. 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  Additionally there is mild diffuse encephalopathy. No seizures were seen throughout the recording. Arlin KIDD Shelton   MR BRAIN WO CONTRAST Result Date: 11/17/2023 CLINICAL DATA:  Initial evaluation for acute TIA. EXAM: MRI HEAD WITHOUT CONTRAST TECHNIQUE: Multiplanar, multiecho pulse sequences of the brain and surrounding structures were obtained without intravenous contrast. COMPARISON:  Prior CT from earlier the same day as well as prior CTA from 06/22/2022. FINDINGS: Brain: Examination degraded by motion artifact. Diffuse prominence of the CSF containing spaces compatible with atrophy, most pronounced about the frontal lobes bilaterally. Patchy and confluent T2/FLAIR hyperintensity involving the periventricular deep white matter both cerebral hemispheres, consistent with chronic small vessel ischemic disease, moderate to advanced in nature. Patchy involvement of the pons noted. Remote lacunar infarct present at the right basal ganglia. Associated wallerian degeneration noted. Remote lacunar infarct noted within the left cerebellum as well. A 1.3 cm focus of diffusion signal abnormality involving the inferior right cerebellum, consistent with an acute to early subacute right PICA distribution infarct. No associated hemorrhage or significant mass  effect. No other evidence for acute or subacute ischemia. No acute intracranial hemorrhage. Few scattered chronic micro hemorrhages noted, likely hypertensive in nature. No mass lesion, midline shift or  mass effect. Mild ventricular prominence related to global parenchymal volume loss without hydrocephalus. No extra-axial fluid collection. Pituitary gland suprasellar region within normal limits. Vascular: Loss of normal flow void within the intradural right V4 segment, which could reflect slow flow and/or occlusion. This appears to be new from prior CTA from 06/22/2022. Major intracranial vascular flow voids are otherwise maintained. Skull and upper cervical spine: Craniocervical junction within normal limits. Decreased T1 signal intensity noted within the visualized bone marrow, nonspecific, but most commonly related to anemia, smoking or obesity. No scalp soft tissue abnormality. Sinuses/Orbits: Bilateral ocular lens replacement with axial myopia. Scattered mucosal thickening noted about the paranasal sinuses. Superimposed air-fluid level within the right maxillary sinus. Small to moderate bilateral mastoid effusions. Negative nasopharynx. Other: None. IMPRESSION: 1. 1.3 cm acute to early subacute right PICA distribution infarct. No associated hemorrhage or significant mass effect. 2. Loss of normal flow void within the intradural right V4 segment, which could reflect slow flow and/or occlusion. This appears to be new from prior CTA from 06/22/2022. 3. Underlying moderate to advanced chronic microvascular ischemic disease with remote lacunar infarcts involving the right basal ganglia and left cerebellum. 4. Underlying atrophy, most pronounced about the frontal lobes bilaterally. 5. Paranasal sinus disease with air-fluid level within the right maxillary sinus. Clinical correlation for possible acute sinusitis recommended. Electronically Signed   By: Morene Hoard M.D.   On: 11/17/2023 00:40   DG Chest Portable 1 View Result Date: 11/17/2023 CLINICAL DATA:  Altered mental status EXAM: PORTABLE CHEST 1 VIEW COMPARISON:  11/16/2023 FINDINGS: Right dialysis catheter is unchanged. Cardiomegaly with vascular  congestion and mild interstitial and airspace opacities compatible with edema. No effusions or acute bony abnormality. IMPRESSION: Cardiomegaly, vascular congestion.  Mild pulmonary edema. Electronically Signed   By: Franky Crease M.D.   On: 11/17/2023 00:26   CT Angio Chest PE W and/or Wo Contrast Result Date: 11/16/2023 CLINICAL DATA:  Chest pain, Pulmonary embolism (PE) suspected, high prob EXAM: CT ANGIOGRAPHY CHEST WITH CONTRAST TECHNIQUE: Multidetector CT imaging of the chest was performed using the standard protocol during bolus administration of intravenous contrast. Multiplanar CT image reconstructions and MIPs were obtained to evaluate the vascular anatomy. RADIATION DOSE REDUCTION: This exam was performed according to the departmental dose-optimization program which includes automated exposure control, adjustment of the mA and/or kV according to patient size and/or use of iterative reconstruction technique. CONTRAST:  75mL OMNIPAQUE  IOHEXOL  350 MG/ML SOLN COMPARISON:  11/16/2023, 10/24/2023 FINDINGS: Cardiovascular: This is a technically adequate evaluation of the pulmonary vasculature. No filling defects or pulmonary emboli. The heart is enlarged without pericardial effusion. No evidence of thoracic aortic aneurysm or dissection. Stable aortic valve calcification, aortic atherosclerosis, and atherosclerosis throughout the coronary vasculature. Right internal jugular dialysis catheter tip within the atriocaval junction. Mediastinum/Nodes: Stable right hilar adenopathy measuring up to 11 mm in short axis. Thyroid , trachea, and esophagus are unremarkable. Small hiatal hernia. Lungs/Pleura: There are dependent areas of consolidation within the lungs bilaterally, favoring hypoventilatory changes over airspace disease. Trace bilateral pleural fluid. No pneumothorax. The central airways are patent. Upper Abdomen: No acute abnormality. Musculoskeletal: No acute or destructive bony abnormalities. Reconstructed  images demonstrate no additional findings. Review of the MIP images confirms the above findings. IMPRESSION: 1. No evidence of pulmonary embolus. 2. Dependent areas of consolidation within the lungs  bilaterally, favor hypoventilatory changes over airspace disease. 3. Trace bilateral pleural effusions. 4. Aortic Atherosclerosis (ICD10-I70.0). Coronary artery atherosclerosis. Electronically Signed   By: Ozell Daring M.D.   On: 11/16/2023 19:07   CT HEAD WO CONTRAST ( ) Result Date: 11/16/2023 CLINICAL DATA:  Head trauma, minor (Age >= 65y) EXAM: CT HEAD WITHOUT CONTRAST TECHNIQUE: Contiguous axial images were obtained from the base of the skull through the vertex without intravenous contrast. RADIATION DOSE REDUCTION: This exam was performed according to the departmental dose-optimization program which includes automated exposure control, adjustment of the mA and/or kV according to patient size and/or use of iterative reconstruction technique. COMPARISON:  CT head 06/22/2022. FINDINGS: Brain: Infarct in the inferior right cerebellum which is new since the 06/12/2022. No evidence of acute hemorrhage, hydrocephalus, extra-axial collection or mass lesion/mass effect. Similar patchy white matter hypodensities, nonspecific but compatible with chronic microvascular ischemic disease. Similar remote perforator infarct in the right basal ganglia. Vascular: Calcific atherosclerosis.  No hyperdense vessel. Skull: No acute fracture. Sinuses/Orbits: Moderate right maxillary sinus mucosal thickening. No acute orbital findings. Other: No mastoid effusions. IMPRESSION: 1. Infarct in the inferior right cerebellum which is new since 06/12/2022 but otherwise age indeterminate. If there is concern for acute infarct, recommend MRI head. 2. No acute hemorrhage. 3. Similar chronic microvascular ischemic changes. Electronically Signed   By: Gilmore GORMAN Molt M.D.   On: 11/16/2023 19:06   DG Shoulder Left Result Date:  11/16/2023 CLINICAL DATA:  Left arm pain status post fall. EXAM: LEFT SHOULDER - 2+ VIEW COMPARISON:  None Available. FINDINGS: There is no evidence of acute fracture or dislocation. Mild degenerative changes of the glenohumeral and acromioclavicular joints. IMPRESSION: No acute osseous abnormality. Electronically Signed   By: [name] Sherry M.D.   On: 11/16/2023 14:54   DG Chest 1 View Result Date: 11/16/2023 CLINICAL DATA:  Chest pain. EXAM: CHEST  1 VIEW COMPARISON:  Radiographs 10/24/2023 and 02/12/2023.  CT 10/24/2023. FINDINGS: 1157 hours. Right IJ hemodialysis catheter projects over the lower right atrium. Stable cardiomegaly and vascular congestion. There is chronic scarring or atelectasis at the left lung base. No edema, confluent airspace disease, significant pleural effusion or pneumothorax. The bones appear unchanged. Telemetry leads overlie the chest. IMPRESSION: Stable cardiomegaly and vascular congestion. No evidence of acute cardiopulmonary process. Electronically Signed   By: Elsie Perone M.D.   On: 11/16/2023 12:14     PHYSICAL EXAM  Temp:  [97.9 F (36.6 C)-99.5 F (37.5 C)] 98.7 F (37.1 C) (02/05 1200) Pulse Rate:  [65-93] 85 (02/05 1200) Resp:  [7-34] 20 (02/05 1200) BP: (96-185)/(33-69) 184/69 (02/05 1212) SpO2:  [96 %-100 %] 99 % (02/05 1200) Arterial Line BP: (85-189)/(28-95) 165/60 (02/05 1200) FiO2 (%):  [30 %-40 %] 30 % (02/05 1212) Weight:  [70 kg] 70 kg (02/05 0500)  General - Well nourished, well developed, intubated on sedation.  Ophthalmologic - fundi not visualized due to noncooperation.  Cardiovascular - Regular rate and rhythm.  Neuro - intubated on sedation, eyes closed, not following commands. With forced eye opening, eyes in mid position, not blinking to visual threat, doll's eyes very sluggish, not tracking, PERRL. Corneal reflex weakly present on the right, gag and cough strongly present. Breathing  over the vent.  Facial symmetry not able to  test due to ET tube.  Tongue protrusion not cooperative. On pain stimulation, only slightly withdraw on the left LE. Sensation, coordination and gait not tested.     ASSESSMENT/PLAN Ms. Vianka ASHLYND MICHNA is a 73  y.o. female with history of ESRD on hemodialysis, CHF, diabetes, right BKA, stroke, seizure, obesity, hypertension and hyperlipidemia admitted for chest pain, confusion, and respiratory failure. No TNK given due to recent stroke.    Severe encephalopathy Etiology likely due to respiratory failure, ESRD, COVID/RSV infection, hypotension EEG this admission moderate to severe diffuse encephalopathy Stroke likely not the cause for current mental status Continue treat underlying disease by CCM.  Stroke:  right then left cerebellar infarct likely secondary to small vessel disease source CT 1/23 right inferior Cerebellar infarct MRI 1/24 acute to early subacute right PICA distribution infarct MRA 1/24 intradural right V4 occlusion CT 2/4 no acute finding MRI 2/5 resolution of small right PICA infarct but new and acute central left cerebellar infarct. Carotid Doppler unremarkable 2D Echo 55 to 60% LDL 37 HgbA1c 5.8 Heparin  subcu for VTE prophylaxis Aspirin  and Brilinta  prior to admission, now on aspirin  81 mg daily and Brilinta  (ticagrelor ) 90 mg bid.  Ongoing aggressive stroke risk factor management Therapy recommendations: Pending Disposition: Pending  Respiratory failure COVID and RSV infection Intubated on sedation Ventilation management per CCM May need tracheostomy per CCM  Diabetes HgbA1c 5.8 goal < 7.0 Controlled CBG monitoring SSI DM education and close PCP follow up  History of seizure/stroke 03/2021 admitted for left-sided weakness.  Found to have right subcortical infarct at BG/CR.  EF 50%.  MRA head and neck unremarkable.  LDL 70, A1c 5.2.  Discharged on DAPT and Crestor  20. 04/2021 admitted for altered mental status.  CT, CTA head and neck negative.  MRI no new  infarct.  EEG showed right central spikes.  Put on Keppra  500 nightly 05/2022 admitted for altered mental status.  MRI showed left CR and right parietal small infarcts.  Carotid Doppler negative, MRI head negative.  LDL 39, A1c 4.6.  Discharged on aspirin  Brilinta .  EEG showed increased amount of irritability, increase Keppra  to 750 twice daily. EEG this admission moderate to severe diffuse encephalopathy, no seizure. Continue Keppra  500 twice daily IV.  Hx of hypertension Hypotension On pressor, Levophed  On midodrine  Long term BP goal normotensive  Hyperlipidemia Home meds: Crestor  20 LDL 37, goal < 70 Now on Crestor  10, maximum dose given renal function Continue statin at discharge  Other Stroke Risk Factors Advanced age CHF PVD with right BKA  Other Active Problems ESRD on hemodialysis Leukocytosis WBC 20.1, afebrile Anemia, hemoglobin 8.7  Hospital day # 13  This patient is critically ill due to respiratory failure, COVID, RSV, ESRD on dialysis, stroke and at significant risk of neurological worsening, death form sepsis, septic shock, heart failure, renal failure. This patient's care requires constant monitoring of vital signs, hemodynamics, respiratory and cardiac monitoring, review of multiple databases, neurological assessment, discussion with family, other specialists and medical decision making of high complexity. I spent 40 minutes of neurocritical care time in the care of this patient.  I discussed with CCM Dr. Kassie.  Ary Cummins, MD PhD Stroke Neurology 11/29/2023 1:45 PM    To contact Stroke Continuity provider, please refer to [email or web address]. After hours, contact General Neurology

## 2023-11-29 NOTE — Progress Notes (Signed)
 Pt transported to CT and back on the vent with RN without incident.

## 2023-11-29 NOTE — Progress Notes (Signed)
 Patient Name: Carolyn Russo Date of Encounter: 11/29/2023 [facility] Cardiologist: Annabella Scarce, MD   Interval Summary  .    Intubated and sedated.   Vital Signs .    Vitals:   11/29/23 0545 11/29/23 0600 11/29/23 0615 11/29/23 0630  BP:      Pulse: 77 78 76 76  Resp: (!) 22 16 18 15   Temp:      TempSrc:      SpO2: 100% 100% 100% 99%  Weight:      Height:        Intake/Output Summary (Last 24 hours) at 11/29/2023 0834 Last data filed at 11/29/2023 0600 Gross per 24 hour  Intake 1254.22 ml  Output 1000 ml  Net 254.22 ml      11/29/2023    5:00 AM 11/28/2023    2:09 PM 11/28/2023   10:39 AM  Last 3 Weights  Weight (lbs) 154 lb 5.2 oz 154 lb 5.2 oz 156 lb 8.4 oz  Weight (kg) 70 kg 70 kg 71 kg      Telemetry/ECG    Sinus rhythm. PVCs.   - Personally Reviewed  EKG 11/17/23: Sinus rhythm.  Rate 100 bpm.  PVC.  Prior inferior infarct.   Echo 11/17/23: 1. Left ventricular ejection fraction, by estimation, is 55 to 60%. The  left ventricle has normal function. The left ventricle has no regional  wall motion abnormalities. Elevated left ventricular end-diastolic  pressure.   2. Right ventricular systolic function is normal. The right ventricular  size is normal. There is severely elevated pulmonary artery systolic  pressure.   3. The mitral valve is normal in structure. Trivial mitral valve  regurgitation. No evidence of mitral stenosis.   4. The aortic valve is tricuspid. There is mild calcification of the  aortic valve. Aortic valve regurgitation is not visualized. No aortic  stenosis is present. Aortic valve mean gradient measures 11.0 mmHg. Aortic  valve Vmax measures 2.16 m/s.   5. The inferior vena cava is dilated in size with <50% respiratory  variability, suggesting right atrial pressure of 15 mmHg.   Echo 11/28/23:  1. There is prominent infrerior wall pseudodyskinesis due to increased  infradiaphragmatic pressure (distended stomach?). Left  ventricular  ejection fraction, by estimation, is 55 to 60%. The left ventricle has  normal function. There is mild concentric  left ventricular hypertrophy.   2. Right ventricular systolic function is normal. The right ventricular  size is normal. Tricuspid regurgitation signal is inadequate for assessing  PA pressure.   3. Left atrial size was mildly dilated.   4. The mitral valve is normal in structure. Trivial mitral valve  regurgitation. No evidence of mitral stenosis.   5. The aortic valve is tricuspid. There is mild thickening of the aortic  valve. Aortic valve regurgitation is not visualized. Aortic valve  sclerosis is present, with no evidence of aortic valve stenosis.   6. The inferior vena cava is dilated in size with <50% respiratory  variability, suggesting right atrial pressure of 15 mmHg.    Physical Exam .    VS:  BP (!) 120/44   Pulse 76   Temp 99.5 F (37.5 C) (Oral)   Resp 15   Ht 5' 3 (1.6 m)   Wt 70 kg   SpO2 99%   BMI 27.34 kg/m  , BMI Body mass index is 27.34 kg/m. GENERAL: Critically ill-appearing.  Intubated and sedated.  HEENT: Oral mucosa unremarkable NECK:  No jugular venous distention,  waveform within normal limits, carotid upstroke brisk and symmetric, no bruits, no thyromegaly LUNGS:  Vented breath sounds. HEART:  RRR.  PMI not displaced or sustained,S1 and S2 within normal limits, no S3, no S4, no clicks, no rubs, no murmurs ABD:  Flat, positive bowel sounds normal in frequency in pitch, no bruits, no rebound, no guarding, no midline pulsatile mass, no hepatomegaly, no splenomegaly EXT:  No edema, no cyanosis no clubbing SKIN:  No rashes no nodules NEURO:  Unable to assess.  Intubated and sedated.  PSYCH:  Unable to assess.  Intubated and sedated.    Assessment & Plan .     79F with ESRD on HD, CVA, DM, r BKA, seizures and HFpEF admitted with COVID-19 and RSV pneumonia.  Initially with AMS and CVA.  On 1/30 she became hypotensive with HD  and had PEA arrest requiring CPR.     # PEA arrest:  Patient became hypotensive with HD and had a PEA arrest.  CPR was performed and no shocks recommended.  She reportedly initially followed commands but now encephalopathic and not following commands.  Requiring Precedex  and Seroquel .  Repeated limited echo for systolic function assessment and there were no changes from prior to the PEA arrest. HS-troponin is lower than on admission suggesting that this event was not primarily cardiac in etiology.  No further cardiac evaluation recommended at this time.   # CAD:  # Hyperlipidemia:  # PVCs:  LHC in 2018 revealed heavily calcified 30-40% proximal LAD disease.  LDL 37 on 11/17/23.  HS troponin elevated 205-->199 on admission.  She reported pleuritic chest pain and shortness of breath on admission.  Systolic function preserved prior to the PEA event, so there were no plans for an inpatient ischemic evaluation.  Dr. Kate considered outpatient stress PET after recovery.   # Labile BP: Yesterday BP required midodrine .  Today BP is >200 systolic.  Currently holding midodrine  dose 2/2 elevated BP.  # Encephalopathy:  Brain MRI this AM with new and acute central and L cerebellar infarct.  Neurology following.    [facility] will sign off.   Medication Recommendations:  n/a Other recommendations (labs, testing, etc):  consider outpatient ischemic evaluation if patient recovers.  Prognosis is poor. Follow up as an outpatient:  As scheduled 12/25/23   For questions or updates, please contact [facility] Please consult [email or web address] for contact info under        Signed, Annabella Scarce, MD

## 2023-11-29 NOTE — TOC Progression Note (Signed)
 Transition of Care [facility]) - Progression Note    Patient Details  Name: Carolyn Russo MRN: [id number] Date of Birth: 1951/03/01  Transition of Care [facility]) CM/SW Contact  Lauraine FORBES Saa, LCSW Phone Number: 11/29/2023, 11:51 AM  Clinical Narrative:     11:51 AM Per medical team, patient remains intubated and on tube feeds. Patient's family is not considering palliative care at this time.   Expected Discharge Plan: Skilled Nursing Facility    Expected Discharge Plan and Services In-house Referral: Clinical Social Work     Living arrangements for the past 2 months: Single Family Home, Skilled Nursing Facility                                       Social Determinants of Health (SDOH) Interventions SDOH Screenings   Food Insecurity: No Food Insecurity (11/17/2023)  Housing: Low Risk  (11/17/2023)  Transportation Needs: No Transportation Needs (11/17/2023)  Utilities: Not At Risk (11/17/2023)  Alcohol Screen: Low Risk  (01/09/2019)  Depression (PHQ2-9): Low Risk  (09/11/2023)  Financial Resource Strain: Low Risk  (09/02/2021)  Physical Activity: Inactive (09/02/2021)  Social Connections: Socially Integrated (11/17/2023)  Stress: Stress Concern Present (09/02/2021)  Tobacco Use: Low Risk  (11/16/2023)    Readmission Risk Interventions    02/14/2023    4:54 PM  Readmission Risk Prevention Plan  Transportation Screening Complete  PCP or Specialist Appt within 3-5 Days Complete  HRI or Home Care Consult Complete  Social Work Consult for Recovery Care Planning/Counseling Complete  Palliative Care Screening Complete  Medication Review Oceanographer) Complete

## 2023-11-30 DIAGNOSIS — I63542 Cerebral infarction due to unspecified occlusion or stenosis of left cerebellar artery: Secondary | ICD-10-CM | POA: Diagnosis not present

## 2023-11-30 DIAGNOSIS — G9340 Encephalopathy, unspecified: Secondary | ICD-10-CM | POA: Diagnosis not present

## 2023-11-30 DIAGNOSIS — R092 Respiratory arrest: Secondary | ICD-10-CM | POA: Diagnosis not present

## 2023-11-30 DIAGNOSIS — U071 COVID-19: Secondary | ICD-10-CM | POA: Diagnosis not present

## 2023-11-30 LAB — RENAL FUNCTION PANEL
Albumin: 1.9 g/dL — ABNORMAL LOW (ref 3.5–5.0)
Anion gap: 12 (ref 5–15)
BUN: 33 mg/dL — ABNORMAL HIGH (ref 8–23)
CO2: 26 mmol/L (ref 22–32)
Calcium: 8.4 mg/dL — ABNORMAL LOW (ref 8.9–10.3)
Chloride: 93 mmol/L — ABNORMAL LOW (ref 98–111)
Creatinine, Ser: 5.21 mg/dL — ABNORMAL HIGH (ref 0.44–1.00)
GFR, Estimated: 8 mL/min — ABNORMAL LOW (ref >60)
Glucose, Bld: 193 mg/dL — ABNORMAL HIGH (ref 70–99)
Phosphorus: 2.7 mg/dL (ref 2.5–4.6)
Potassium: 4.5 mmol/L (ref 3.5–5.1)
Sodium: 131 mmol/L — ABNORMAL LOW (ref 135–145)

## 2023-11-30 LAB — CBC
HCT: 23 % — ABNORMAL LOW (ref 36.0–46.0)
Hemoglobin: 8 g/dL — ABNORMAL LOW (ref 12.0–15.0)
MCH: 29.7 pg (ref 26.0–34.0)
MCHC: 34.8 g/dL (ref 30.0–36.0)
MCV: 85.5 fL (ref 80.0–100.0)
Platelets: 338 10*3/uL (ref 150–400)
RBC: 2.69 MIL/uL — ABNORMAL LOW (ref 3.87–5.11)
RDW: 17.5 % — ABNORMAL HIGH (ref 11.5–15.5)
WBC: 16.8 10*3/uL — ABNORMAL HIGH (ref 4.0–10.5)
nRBC: 0 % (ref 0.0–0.2)

## 2023-11-30 LAB — GLUCOSE, CAPILLARY
Glucose-Capillary: 105 mg/dL — ABNORMAL HIGH (ref 70–99)
Glucose-Capillary: 138 mg/dL — ABNORMAL HIGH (ref 70–99)
Glucose-Capillary: 176 mg/dL — ABNORMAL HIGH (ref 70–99)
Glucose-Capillary: 177 mg/dL — ABNORMAL HIGH (ref 70–99)
Glucose-Capillary: 181 mg/dL — ABNORMAL HIGH (ref 70–99)
Glucose-Capillary: 203 mg/dL — ABNORMAL HIGH (ref 70–99)

## 2023-11-30 LAB — MAGNESIUM: Magnesium: 2.2 mg/dL (ref 1.7–2.4)

## 2023-11-30 MED ORDER — HEPARIN SODIUM (PORCINE) 1000 UNIT/ML DIALYSIS
3600.0000 [IU] | INTRAMUSCULAR | Status: DC | PRN
  Administered 2023-11-30: 3600 [IU]
  Filled 2023-11-30 (×2): qty 4

## 2023-11-30 MED ORDER — HEPARIN SODIUM (PORCINE) 1000 UNIT/ML DIALYSIS
2500.0000 [IU] | Freq: Once | INTRAMUSCULAR | Status: AC
  Administered 2023-11-30: 2500 [IU] via INTRAVENOUS_CENTRAL
  Filled 2023-11-30: qty 3

## 2023-11-30 MED ORDER — HEPARIN SODIUM (PORCINE) 1000 UNIT/ML DIALYSIS
1000.0000 [IU] | INTRAMUSCULAR | Status: AC | PRN
  Administered 2023-11-30: 1000 [IU] via INTRAVENOUS_CENTRAL
  Filled 2023-11-30: qty 1

## 2023-11-30 NOTE — TOC Progression Note (Signed)
 Transition of Care [facility]) - Progression Note    Patient Details  Name: Carolyn Russo MRN: [id number] Date of Birth: 1950-12-23  Transition of Care [name]) CM/SW Contact  Lauraine FORBES Saa, LCSW Phone Number: 11/30/2023, 11:47 AM  Clinical Narrative:     11:47 AM Per progressions, tube feeds were paused due to vomiting but patient is to be reassessed today. Patient remains intubated and continues to receive hemodialysis.   Expected Discharge Plan: Skilled Nursing Facility    Expected Discharge Plan and Services In-house Referral: Clinical Social Work     Living arrangements for the past 2 months: Single Family Home, Skilled Nursing Facility                                       Social Determinants of Health (SDOH) Interventions SDOH Screenings   Food Insecurity: No Food Insecurity (11/17/2023)  Housing: Low Risk  (11/17/2023)  Transportation Needs: No Transportation Needs (11/17/2023)  Utilities: Not At Risk (11/17/2023)  Alcohol Screen: Low Risk  (01/09/2019)  Depression (PHQ2-9): Low Risk  (09/11/2023)  Financial Resource Strain: Low Risk  (09/02/2021)  Physical Activity: Inactive (09/02/2021)  Social Connections: Socially Integrated (11/17/2023)  Stress: Stress Concern Present (09/02/2021)  Tobacco Use: Low Risk  (11/16/2023)    Readmission Risk Interventions    02/14/2023    4:54 PM  Readmission Risk Prevention Plan  Transportation Screening Complete  PCP or Specialist Appt within 3-5 Days Complete  HRI or Home Care Consult Complete  Social Work Consult for Recovery Care Planning/Counseling Complete  Palliative Care Screening Complete  Medication Review Oceanographer) Complete

## 2023-11-30 NOTE — Progress Notes (Addendum)
 Nephrology Follow-Up Consult note   Assessment/Recommendations: Carolyn Russo is a/an 73 y.o. female with a past medical history significant for ESRD, admitted for AMS w/ AHRF, CAP, COVID, RSV.       Dialysis Orders: MWF [facility]  4h   B350    75.5kg   2/2 bath   R fem TDC  Heparin  2600 + 1400 midrun - last HD 1/22, getting to dry wt - 3-4 drops in dry wt in last 3 wks - venofer  100 qhd thru 1/31 - mircera 150 q2, last 1/17, due 1/31    CXR 2/04 - no edema   Assessment/Plan: AHRF/ +COVID and +RSV pna - some volume excess during the hospitalization, resolved. Abx per primary. Now on ventilator after cardiac arrest.  Per CCM.  CVA - mgmt per neurology.  AMS - multifactorial ESRD - on HD MWF. Last HD Tuesday. HD today (keep off schedule this week).  Hypotension - off levo support. Po midodrine  dc'd 2/05. Per pmd.  Volume - down 5kg from edw, last CXR 2/04 no edema. 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 Last esa darbe 150 mcg on 1/04. Hb down 8-9 range --> will order darbe 150mcg SQ weekly and will check ferritin/ tsat.  Secondary hyperparathyroidism - Cont home binders when taking po H/o seizure d/o - on Keppra  PEA arrest- mgmt per ccm.   GOC: chronically ill now w/ AMS, hypoxia and cardiac arrest.  Family wishes to continue with course of care for now    Carolyn Russo [facility] 11/30/2023 11:09 AM  ___________________________________________________________  Interval History/Subjective: on vent. No changes. BP's by a-line go real high when pt is coughing or agitate per the HD RN, but then they drop back down to around 106/ 80. RN doesn't think the high Bp's are accurate and or more related to coughing/ agitation/ movement.     Physical Exam: Vitals:   11/30/23 1045 11/30/23 1100  BP: (!) 162/65 (!) 145/60  Pulse: 78   Resp: (!) 27   Temp:    SpO2: 100%    GEN: Chronically ill-appearing, lying in bed, no distress ENT: no nasal discharge, mmm, ET  tube in place EYES: no scleral icterus, eyes closed CV: normal rate, no rub PULM: Bilateral chest rise, ventilated ABD: NABS, non-distended SKIN: no rashes or jaundice EXT: minimal edema, warm and well perfused    Recent Labs  Lab 11/29/23 0511 11/30/23 0401  HGB 8.7* 8.0*  ALBUMIN  2.1* 1.9*  CALCIUM  8.6* 8.4*  PHOS 2.2* 2.7  CREATININE 4.24* 5.21*  K 4.0 4.5    Inpatient medications:   stroke: early stages of recovery book   Does not apply Once   aspirin   81 mg Per Tube Daily   Chlorhexidine  Gluconate Cloth  6 each Topical Q0600   Chlorhexidine  Gluconate Cloth  6 each Topical Q0600   famotidine   10 mg Per Tube Daily   heparin  injection (subcutaneous)  5,000 Units Subcutaneous Q8H   insulin  aspart  0-15 Units Subcutaneous Q4H   lidocaine   1 patch Transdermal Q24H   multivitamin  1 tablet Per Tube QHS   mouth rinse  15 mL Mouth Rinse Q2H   polyethylene glycol  17 g Per Tube BID   QUEtiapine   12.5 mg Per Tube BID   rosuvastatin   10 mg Per Tube Daily   senna-docusate  1 tablet Per Tube BID   sodium chloride  flush  3 mL Intravenous Q12H   ticagrelor   90 mg  Per Tube BID    dexmedetomidine  (PRECEDEX ) IV infusion 0.6 mcg/kg/hr (11/30/23 1053)   feeding supplement (VITAL 1.5 CAL) 45 mL/hr at 11/30/23 1000   levETIRAcetam  Stopped (11/29/23 2140)   acetaminophen  **OR** acetaminophen , bisacodyl , chlorpheniramine-HYDROcodone , fentaNYL  (SUBLIMAZE ) injection, heparin , hydrALAZINE , labetalol , ondansetron  (ZOFRAN ) IV, mouth rinse, SMOG, witch hazel-glycerin 

## 2023-11-30 NOTE — Progress Notes (Signed)
 Received patient in bed to unit.  Alert and oriented.  Informed consent signed and in chart.   TX duration:3.25  Patient tolerated well.  Alert, without acute distress.  Hand-off given to patient's nurse.   Access used: catheter Access issues: none  Total UF removed: 0 Medication(s) given: pre and mid tx heparin  bolus, post HD tx cvc heparin  blocks   11/30/23 1215  Vitals  BP (!) 79/36  MAP (mmHg) (!) 50  Pulse Rate (!) 47  ECG Heart Rate 63  Resp 15  Oxygen  Therapy  SpO2 100 %  During Treatment Monitoring  Blood Flow Rate (mL/min) 349 mL/min  Arterial Pressure (mmHg) -149.49 mmHg  Venous Pressure (mmHg) 136.96 mmHg  TMP (mmHg) -3.63 mmHg  Ultrafiltration Rate (mL/min) 170 mL/min  Dialysate Flow Rate (mL/min) 300 ml/min  Dialysate Potassium Concentration 3  Dialysate Calcium  Concentration 2.5  Duration of HD Treatment -hour(s) 3.01 hour(s)  Cumulative Fluid Removed (mL) per Treatment  -83.69  HD Safety Checks Performed Yes  Intra-Hemodialysis Comments Tx completed  Dialysis Fluid Bolus Normal Saline  Bolus Amount (mL) 300 mL  Post Treatment  Dialyzer Clearance Lightly streaked  Hemodialysis Intake (mL) 200 mL  Liters Processed 64.7  Fluid Removed (mL) 0 mL  Tolerated HD Treatment Yes  Hemodialysis Catheter Right Subclavian  No placement date or time found.   Orientation: Right  Access Location: Subclavian  Site Condition No complications  Blue Lumen Status Flushed;Heparin  locked  Red Lumen Status Flushed;Heparin  locked  Catheter fill volume (Arterial) 1.6 cc  Catheter fill volume (Venous) 1.6  Dressing Type Transparent  Post treatment catheter status Capped and Clamped      Alan Duncan, RN [facility]

## 2023-11-30 NOTE — Progress Notes (Signed)
 NAME:  CAYLAH PLOUFF, MRN:  [id number], DOB:  Mar 23, 1951, LOS: 14 ADMISSION DATE:  11/16/2023, CONSULTATION DATE:  1/30 REFERRING MD:  [facility], CHIEF COMPLAINT:  code blue   History of Present Illness:  Prisha TIMMY CLEVERLY is a 73 yo female with ESRD on HD MWF, CHF, previous CVA with residual left sided weakness, T2DM, right BKA, and seizure disorder. She presented to [facility] on 1/23 with confusion, left sided chest pain, shortness of breath, and coughing. She was found to be hypoxic on room air and placed on 2 L [place]. CT chest was done without evidence of PE.  MRI revealed a 1.3cm acute to early subacute right PICA infarct. Also was admitted with pneumonia due to COVID and RSV and was started on antibiotics for likely superimposed bacterial infection.    On 1/30 early morning the patient was ~2 hrs into dialysis when her blood pressure dropped. UF goal was decreased but since the patient's level of consciousnesses decreased, she received 300 cc NS bolus. EKG changes were noted and rapid response was activated, dialysis stopped, and blood returned to the patient. Around 6:30 AM the patient was noted to be apneic without a pulse and code blue was called. Patient was noted to have PEA arrest. CPR was done for a few minutes before ROSC was achieved. She was transferred to 25M and patient was intubated due to failure to protect airway.  Pertinent  Medical History  ESRD CHF CVA T2DM Seizures  Significant Hospital Events: Including procedures, antibiotic start and stop dates in addition to other pertinent events   1/23 admit to [facility] for acute infarct + covid/rsv pneumonia 1/30 admit to ICU after patient had PEA arrest  2/1 Failed SBT due to apnea. ABG with respiratory alkalosis. RR decreased 2/2 Tolerating SBT. Remains encephalopathic 2/5 Continues to tolerate SBT during day, but remains encephalopathic/unable to protect airway if extubated   Interim History / Subjective:   Patient remains on precedex  but is  not awake or following any commands. Having a lot of coughing and vomiting, likely secondary to gagging on ETT.   Objective   Blood pressure (!) 184/69, pulse 70, temperature 98.9 F (37.2 C), temperature source Oral, resp. rate 19, height 5' 3 (1.6 m), weight 70.6 kg, SpO2 100%.    Vent Mode: PRVC FiO2 (%):  [30 %] 30 % Set Rate:  [15 bmp] 15 bmp Vt Set:  [420 mL] 420 mL PEEP:  [5 cmH20] 5 cmH20 Pressure Support:  [12 cmH20] 12 cmH20 Plateau Pressure:  [15 cmH20-16 cmH20] 15 cmH20   Intake/Output Summary (Last 24 hours) at 11/30/2023 9386 Last data filed at 11/30/2023 0600 Gross per 24 hour  Intake 1262.75 ml  Output 120 ml  Net 1142.75 ml   Filed Weights   11/28/23 1409 11/29/23 0500 11/30/23 0500  Weight: 70 kg 70 kg 70.6 kg   Examination: General: critically ill appearing female laying in bed, encephalopathic HENT: ETT in place, pupils sluggish but reactive Lungs: Diminished to auscultation bilaterally  Cardiovascular: regular rate, rhythm. No murmurs Abdomen: soft, nontender, +BS Extremities: s/p R BKA, no LE edema Neuro: opens eyes occasionally, but unable to track or follow commands. Unchanged neuro exam today GU: foley  Labs, tests, and imaging in the EMR in the last 24 hrs indepenendly reviewed by me. Pertinent findings below  Na 131 Cl 93 BUN/Cr c/w ESRD Mg 2.2 Phos 2.7  WBC 16.8 Hb 8.0  CBGs 74-200  Resolved Hospital Problem list   Hypokalemia Hypotension Assessment &  Plan:   PEA arrest: due to hypotension, not primarily cardiac in nature  Acute encephalopathy: secondary to post-arrest, stroke, metabolic derangements, and drug induced Patient remains altered, unable to track or follow commands. Concern for cefepime  toxicity given her hx of ESRD, med was discontinued 2/4. MRI brain did show a new and acute central left cerebellar infarct.  - Appreciate neurology recs - Continue precedex , wean as able  - Continue seroquel  12.5 mg bid for  agitation  Hypertension - Hypotensive initially, levophed  discontinued - Discontinue midodrine    Pneumonia due to COVID and RSV Received empiric treatment with ceftriaxone  and azithro for possible superimposed bacterial pneumonia and also s/p course of solumedrol. Also received 6 days of cefepime  post-code- discontinued for improved resp status and possible neurotoxicity.   Ventilator dependence after PEA arrest Patient was not ever truly hypoxemic, was intubated for failure to protect airway.  - Full vent support: PEEP 5, FiO2 30% - VAP bundle, stress ulcer prophylaxis - Daily SBT (continues to pass SBTs) - Continue to wean sedation (mentation continues to preclude extubation) - Recommending tracheostomy as patient continues to cough, gag, and vomit on vent; family considering but not yet ready   Acute to early subacute right PICA infarct on 1/24, resolved New acute central left cerebellar infarct  - Appreciate neuro follow up  - Continue DAPT with aspirin  and brilinta  - Continue statin - Needs 30d heart monitor at discharge  Abnormal C6 signal MRI showed a nonspecific bone marrow signal of c6 vertebral body - unclear if related to a malignancy vs discitis/osteomyelitis. Will hold off on C spine imaging, as family does not want to pursue this workup and priority is mental status/neuro recovery.   Leukocytosis - WBC downtrending, likely reactive as patient is afebrile without clear source of infection  Seizure disorder - Continue keppra  500 mg BID  ESRD on HD MWF - Dialysis per nephro, next HD postponed to today - Discontinued renvela  due to low phos levels  Anemia of chronic disease - Hb 8.0 - ESA held on 1/31 per nephro   Diabetes - Continue SSI  Goals of Care Had extensive GOC conversation with patient's son at bedside yesterday and with husband via phone. We discussed early tracheostomy to help minimize discomfort and administration of sedation and how if patient is  able recover that this would not be a permanent fixture if she is able to rehab after discharge. Husband expresses preference to reach a point to trial off of ventilator before considering tracheostomy. We discussed risk of aspiration due to neurologic status in that scenario. For now, plan to continue weaning sedation and continuing daily SBT/WUA with hope that patient's neurologic status will continue to improve    Best Practice (right click and Reselect all SmartList Selections daily)   Diet/type: tubefeeds held  DVT prophylaxis prophylactic heparin   Pressure ulcer(s): pressure ulcer assessment deferred  GI prophylaxis: H2B Lines: R subclavian HD cath, R femoral CVC, R femoral art line Foley:  N/A Code Status:  full code Last date of multidisciplinary goals of care discussion [2/6 updated husband at bedside]   Labs   CBC: Recent Labs  Lab 11/26/23 0200 11/27/23 0516 11/28/23 0442 11/29/23 0511 11/30/23 0401  WBC 22.0* 21.3* 21.0* 20.1* 16.8*  NEUTROABS 18.5*  --   --   --   --   HGB 10.5* 9.5* 9.2* 8.7* 8.0*  HCT 30.7* 27.3* 26.4* 25.2* 23.0*  MCV 87.2 85.8 85.7 86.3 85.5  PLT 218 225 268 303 338  Basic Metabolic Panel: Recent Labs  Lab 11/26/23 0200 11/26/23 1839 11/27/23 0516 11/28/23 0442 11/29/23 0511 11/30/23 0401  NA 134*  --  133* 134* 131* 131*  K 4.4  --  4.1 4.8 4.0 4.5  CL 96*  --  97* 94* 92* 93*  CO2 20*  --  25 25 28 26   GLUCOSE 232*  --  133* 180* 151* 193*  BUN 47*  --  26* 36* 25* 33*  CREATININE 7.45*  --  5.00* 6.19* 4.24* 5.21*  CALCIUM  8.6*  --  8.4* 8.4* 8.6* 8.4*  MG 2.0 1.7 1.7 2.4 2.3 2.2  PHOS 4.0 2.7 2.6 2.3* 2.2* 2.7   GFR: Estimated Creatinine Clearance: 8.8 mL/min (A) (by C-G formula based on SCr of 5.21 mg/dL (H)). Recent Labs  Lab 11/27/23 0516 11/28/23 0442 11/29/23 0511 11/30/23 0401  WBC 21.3* 21.0* 20.1* 16.8*    Liver Function Tests: Recent Labs  Lab 11/24/23 0434 11/26/23 0200 11/29/23 0511 11/30/23 0401   AST 40  --   --   --   ALT 69*  --   --   --   ALKPHOS 80  --   --   --   BILITOT 1.5*  --   --   --   PROT 6.5  --   --   --   ALBUMIN  3.0* 2.6* 2.1* 1.9*   No results for input(s): LIPASE, AMYLASE in the last 168 hours. No results for input(s): AMMONIA in the last 168 hours.  ABG    Component Value Date/Time   PHART 7.515 (H) 11/25/2023 1105   PCO2ART 29.8 (L) 11/25/2023 1105   PO2ART 147 (H) 11/25/2023 1105   HCO3 24.0 11/25/2023 1105   TCO2 25 11/25/2023 1105   ACIDBASEDEF 2.0 05/31/2022 1049   O2SAT 100 11/25/2023 1105     Coagulation Profile: No results for input(s): INR, PROTIME in the last 168 hours.  Cardiac Enzymes: No results for input(s): CKTOTAL, CKMB, CKMBINDEX, TROPONINI in the last 168 hours.  HbA1C: Hemoglobin A1C  Date/Time Value Ref Range Status  12/22/2020 12:00 AM 8.6  Final  06/19/2020 12:00 AM 9.6  Final   Hgb A1c MFr Bld  Date/Time Value Ref Range Status  10/26/2023 11:30 AM 5.8 (H) 4.8 - 5.6 % Final    Comment:    (NOTE)         Prediabetes: 5.7 - 6.4         Diabetes: >6.4         Glycemic control for adults with diabetes: <7.0   02/10/2023 04:52 AM 6.7 (H) 4.8 - 5.6 % Final    Comment:    (NOTE) Pre diabetes:          5.7%-6.4%  Diabetes:              >6.4%  Glycemic control for   <7.0% adults with diabetes     CBG: Recent Labs  Lab 11/29/23 1208 11/29/23 1602 11/29/23 1946 11/29/23 2314 11/30/23 0314  GLUCAP 74 158* 231* 200* 181*    Review of Systems:   Unable to obtain  Past Medical History:  She,  has a past medical history of Acute GI bleeding, Allergy, Anemia, Anterior chest wall pain, Appendicitis (1965), Asthma, Body mass index 37.0-37.9, adult, Breast pain, Cataract, CHF (congestive heart failure) (HCC), Cognitive change (04/20/2021), Complication of anesthesia, Dehydration (2014), Deviated septum (1971), Diabetes mellitus, Dysphagia due to old stroke, Dyspnea (2014), ESRD on hemodialysis  (HCC), Extrinsic asthma, Fibroid (1980),  GERD (gastroesophageal reflux disease), Heart murmur, History of migraine, History of seizure, gestational diabetes, Hyperlipidemia, Hypertension (2014), Inguinal hernia (1959), Malaise and fatigue (2014), Non-IgE mediated allergic asthma (2014), Obesity, Pelvic pain, Pregnancy, high-risk (1985), Stroke (HCC) (04/20/2021), Tonsillitis (1968), Uterine fibroid (1980), and Visual field defect.   Surgical History:   Past Surgical History:  Procedure Laterality Date   ABDOMINAL AORTOGRAM W/LOWER EXTREMITY N/A 02/21/2022   Procedure: ABDOMINAL AORTOGRAM W/LOWER EXTREMITY;  Surgeon: Sheree Penne Bruckner, MD;  Location: [facility] INVASIVE CV LAB;  Service: Cardiovascular;  Laterality: N/A;   AMPUTATION Right 05/18/2022   Procedure: RIGHT BELOW KNEE AMPUTATION;  Surgeon: Harden Jerona GAILS, MD;  Location: [facility] OR;  Service: Orthopedics;  Laterality: Right;   AMPUTATION TOE Right 04/15/2022   Procedure: AMPUTATION  LST TOERIGHT FOOT;  Surgeon: Silva Juliene SAUNDERS, DPM;  Location: [facility] ORS;  Service: Podiatry;  Laterality: Right;   APPENDECTOMY  1959   BASCILIC VEIN TRANSPOSITION Right 04/30/2021   Procedure: RIGHT FIRST STAGE BASCILIC VEIN TRANSPOSITION;  Surgeon: Eliza Bruckner RAMAN, MD;  [facility];  Service: Vascular;  Laterality: Right;   CESAREAN SECTION  1985   COLONOSCOPY     ESOPHAGOGASTRODUODENOSCOPY (EGD) WITH PROPOFOL  N/A 04/18/2021   Procedure: ESOPHAGOGASTRODUODENOSCOPY (EGD) WITH PROPOFOL ;  Surgeon: Avram Lupita BRAVO, MD;  Location: [facility] ENDOSCOPY;  Service: Endoscopy;  Laterality: N/A;   EYE SURGERY     bilateral cataract    FLEXIBLE SIGMOIDOSCOPY N/A 04/18/2021   Procedure: FLEXIBLE SIGMOIDOSCOPY;  Surgeon: Avram Lupita BRAVO, MD;  Location: [facility] ENDOSCOPY;  Service: Endoscopy;  Laterality: N/A;   HERNIA REPAIR  1959   IR FLUORO GUIDE CV LINE RIGHT  03/18/2021   IR US  GUIDE VASC ACCESS RIGHT  03/18/2021   LEFT HEART CATH AND CORONARY ANGIOGRAPHY N/A 04/04/2017    Procedure: Left Heart Cath and Coronary Angiography;  Surgeon: Claudene Victory ORN, MD;  Location: [facility] INVASIVE CV LAB;  Service: Cardiovascular;  Laterality: N/A;   MYOMECTOMY  1980, 2004, 2007   PERIPHERAL VASCULAR THROMBECTOMY  02/21/2022   Procedure: PERIPHERAL VASCULAR THROMBECTOMY;  Surgeon: Sheree Penne Bruckner, MD;  Location: [facility] INVASIVE CV LAB;  Service: Cardiovascular;;  Right Tibial   RHINOPLASTY  1971   ROTATOR CUFF REPAIR  2003   SURGICAL REPAIR OF HEMORRHAGE  2015   TONSILLECTOMY  1968     Social History:   reports that she has never smoked. She has never used smokeless tobacco. She reports that she does not drink alcohol and does not use drugs.   Family History:  Her family history includes Alzheimer's disease in her father; Colon cancer in her cousin; Diabetes in her cousin and maternal aunt; Melanoma in her mother; Psoriasis in her mother. There is no history of Colon polyps, Esophageal cancer, Cancer - Colon, Liver disease, BRCA 1/2, or Breast cancer.   Allergies Allergies  Allergen Reactions   Almond Oil Anaphylaxis   Food Anaphylaxis    Peanuts - anaphylaxis     Gadolinium Derivatives Other (See Comments)    Gadolinium-Containing Contrast Media    Januvia  [Sitagliptin ] Other (See Comments)   Pork-Derived Products Other (See Comments)    Does not eat pork       Home Medications  Prior to Admission medications   Medication Sig Start Date End Date Taking? Authorizing Provider  acetaminophen  (TYLENOL ) 500 MG tablet Take 1,000 mg by mouth 2 (two) times daily as needed (leg and arm pain).   Yes [provider]  acetaminophen  (TYLENOL ) 650 MG CR tablet Take 650  mg by mouth 2 (two) times daily.   Yes [provider]  albuterol  (PROVENTIL ) (2.5 MG/3ML) 0.083% nebulizer solution Use 1 vial (2.5 mg total) by nebulization every 4 (four) hours as needed for wheezing or shortness of breath. 02/14/23 02/14/24 Yes Vann, Jessica U, DO  Amino Acids-Protein  Hydrolys (PRO-STAT SUGAR FREE PO) Take 30 mLs by mouth 2 (two) times daily.   Yes [provider]  amitriptyline  (ELAVIL ) 25 MG tablet Take 0.5-1 tablets (12.5-25 mg total) by mouth at bedtime. Patient taking differently: Take 12.5 mg by mouth at bedtime. 09/11/23  Yes Lovorn, Megan, MD  aspirin  81 MG chewable tablet Chew 81 mg by mouth daily.   Yes [provider]  calcitRIOL  (ROCALTROL ) 0.5 MCG capsule Take 0.5 mcg by mouth See admin instructions. Only takes on Dialysis days Monday,Wednesday,Friday 07/10/23  Yes [provider]  cinacalcet  (SENSIPAR ) 30 MG tablet Take 30 mg by mouth daily.   Yes [provider]  cyclobenzaprine  (FLEXERIL ) 5 MG tablet Take 1 tablet (5 mg total) by mouth 3 (three) times daily as needed for muscle spasms. 11/01/23  Yes Vernon Ranks, MD  diclofenac  Sodium (VOLTAREN ) 1 % GEL Apply 2 g topically 4 (four) times daily. To left wrist 05/26/21  Yes Love, Sharlet RAMAN, PA-C  Glucagon, rDNA, (GLUCAGON EMERGENCY) 1 MG KIT Inject 1 mg into the vein as needed (CBG of 65mg /dL).   Yes [provider]  hydrALAZINE  (APRESOLINE ) 100 MG tablet Take 1 tablet (100 mg total) by mouth 3 (three) times daily. 11/01/23 12/01/23 Yes Vernon Ranks, MD  insulin  aspart (NOVOLOG ) 100 UNIT/ML injection Inject 0-6 Units into the skin 3 (three) times daily with meals. CBG < 70: Implement Hypoglycemia Standing Orders and refer to Hypoglycemia Standing Orders sidebar report CBG 70 - 120: 0 units CBG 121 - 150: 0 units CBG 151 - 200: 1 unit CBG 201-250: 2 units CBG 251-300: 3 units CBG 301-350: 4 units CBG 351-400: 5 units CBG > 400: Give 6 units and call MD 06/29/22  Yes Pokhrel, Laxman, MD  levETIRAcetam  (KEPPRA ) 250 MG tablet Take 1.5 tablets (375 mg total) by mouth every Monday, Wednesday, and Friday at 6 PM. 06/29/22  Yes Pokhrel, Laxman, MD  levETIRAcetam  (KEPPRA ) 750 MG tablet Take 1 tablet (750 mg total) by mouth at bedtime. 06/29/22  Yes Pokhrel, Laxman, MD   loratadine  (CLARITIN ) 10 MG tablet Take 10 mg by mouth daily.   Yes [provider]  losartan  (COZAAR ) 50 MG tablet Take 1 tablet (50 mg total) by mouth 2 (two) times daily. 11/01/23 12/01/23 Yes Pahwani, Ranks, MD  melatonin 5 MG TABS Take 5 mg by mouth at bedtime.   Yes [provider]  mometasone -formoterol  (DULERA ) 200-5 MCG/ACT AERO Inhale 2 puffs into the lungs 2 (two) times daily. 04/15/22  Yes Jarold Medici, MD  pantoprazole  (PROTONIX ) 40 MG tablet Take 1 tablet (40 mg total) by mouth daily. Patient taking differently: Take 40 mg by mouth at bedtime. 02/07/22  Yes Jarold Medici, MD  prochlorperazine  (COMPAZINE ) 10 MG tablet Take 1 tablet (10 mg total) by mouth every 8 (eight) hours as needed for nausea or vomiting. 02/15/23  Yes Danis, Victory LITTIE MOULD, MD  rosuvastatin  (CRESTOR ) 20 MG tablet Take 1 tablet (20 mg total) by mouth daily. Patient taking differently: Take 20 mg by mouth at bedtime. 08/18/21  Yes Ghumman, Ramandeep, NP  sevelamer  carbonate (RENVELA ) 0.8 g PACK packet Take 0.8 g by mouth 3 (three) times daily. 10/19/21  Yes [provider]  ticagrelor  (BRILINTA ) 90 MG TABS tablet Take 1 tablet (90 mg total) by mouth 2 (two) times daily. 06/10/22  Yes Leotis Bogus, MD  aspirin  EC 81 MG tablet Take 1 tablet (81 mg total) by mouth daily. Swallow whole. Patient not taking: Reported on 11/17/2023 06/10/22   Leotis Bogus, MD  gabapentin  (NEURONTIN ) 300 MG capsule Take 1 capsule (300 mg total) by mouth at bedtime. Patient not taking: Reported on 11/17/2023 09/11/23   Lovorn, Megan, MD  hydrocortisone  (ANUSOL -HC) 2.5 % rectal cream Place rectally 4 (four) times daily. Patient not taking: Reported on 11/17/2023 02/14/23   Vann, Jessica U, DO  Iron  Sucrose (VENOFER  IV) Inject 50 mg into the skin once a week. 08/16/23 10/18/24  [provider]  Methoxy PEG-Epoetin  Beta (MIRCERA IJ) Inject 1 Dose into the skin every 14 (fourteen) days. 04/19/23 08/28/24  [provider]  methylphenidate  (RITALIN ) 5 MG tablet Take 1 tablet (5 mg total) by mouth 2 (two) times daily with breakfast and lunch. Patient not taking: Reported on 11/17/2023 09/11/23   Lovorn, Megan, MD     Critical care time: 30 min

## 2023-11-30 NOTE — Progress Notes (Signed)
 STROKE TEAM PROGRESS NOTE   SUBJECTIVE (INTERVAL HISTORY) No family is at the bedside.  Patient still intubated, on vent, unresponsive on precedex  with intermittent fentanyl  bolus due to double stack on vent and severe gaging and coughing with lighting off sedation.    OBJECTIVE Temp:  [98.5 F (36.9 C)-100.7 F (38.2 C)] 100.7 F (38.2 C) (02/06 1938) Pulse Rate:  [47-88] 74 (02/06 1815) Cardiac Rhythm: Normal sinus rhythm (02/06 0839) Resp:  [10-27] 27 (02/06 1815) BP: (79-190)/(33-103) 123/50 (02/06 1800) SpO2:  [98 %-100 %] 98 % (02/06 1815) Arterial Line BP: (64-215)/(36-88) 148/51 (02/06 1815) FiO2 (%):  [30 %] 30 % (02/06 2115) Weight:  [70.6 kg] 70.6 kg (02/06 1228)  Recent Labs  Lab 11/30/23 0314 11/30/23 0754 11/30/23 1131 11/30/23 1527 11/30/23 1936  GLUCAP 181* 203* 105* 138* 177*   Recent Labs  Lab 11/26/23 0200 11/26/23 1839 11/27/23 0516 11/28/23 0442 11/29/23 0511 11/30/23 0401  NA 134*  --  133* 134* 131* 131*  K 4.4  --  4.1 4.8 4.0 4.5  CL 96*  --  97* 94* 92* 93*  CO2 20*  --  25 25 28 26   GLUCOSE 232*  --  133* 180* 151* 193*  BUN 47*  --  26* 36* 25* 33*  CREATININE 7.45*  --  5.00* 6.19* 4.24* 5.21*  CALCIUM  8.6*  --  8.4* 8.4* 8.6* 8.4*  MG 2.0 1.7 1.7 2.4 2.3 2.2  PHOS 4.0 2.7 2.6 2.3* 2.2* 2.7   Recent Labs  Lab 11/24/23 0434 11/26/23 0200 11/29/23 0511 11/30/23 0401  AST 40  --   --   --   ALT 69*  --   --   --   ALKPHOS 80  --   --   --   BILITOT 1.5*  --   --   --   PROT 6.5  --   --   --   ALBUMIN  3.0* 2.6* 2.1* 1.9*   Recent Labs  Lab 11/26/23 0200 11/27/23 0516 11/28/23 0442 11/29/23 0511 11/30/23 0401  WBC 22.0* 21.3* 21.0* 20.1* 16.8*  NEUTROABS 18.5*  --   --   --   --   HGB 10.5* 9.5* 9.2* 8.7* 8.0*  HCT 30.7* 27.3* 26.4* 25.2* 23.0*  MCV 87.2 85.8 85.7 86.3 85.5  PLT 218 225 268 303 338   No results for input(s): CKTOTAL, CKMB, CKMBINDEX, TROPONINI in the last 168 hours. No results for input(s):  LABPROT, INR in the last 72 hours. No results for input(s): COLORURINE, LABSPEC, PHURINE, GLUCOSEU, HGBUR, BILIRUBINUR, KETONESUR, PROTEINUR, UROBILINOGEN, NITRITE, LEUKOCYTESUR in the last 72 hours.  Invalid input(s): APPERANCEUR     Component Value Date/Time   CHOL 90 11/17/2023 0349   CHOL 96 (L) 04/13/2022 1526   TRIG 103 11/29/2023 0511   HDL 38 (L) 11/17/2023 0349   HDL 34 (L) 04/13/2022 1526   CHOLHDL 2.4 11/17/2023 0349   VLDL 15 11/17/2023 0349   LDLCALC 37 11/17/2023 0349   LDLCALC 39 04/13/2022 1526   Lab Results  Component Value Date   HGBA1C 5.8 (H) 10/26/2023   No results found for: LABOPIA, COCAINSCRNUR, LABBENZ, AMPHETMU, THCU, LABBARB  No results for input(s): ETH in the last 168 hours.  I have personally reviewed the radiological images below and agree with the radiology interpretations.  EEG adult Result Date: 11/29/2023 Shelton Arlin KIDD, MD     11/29/2023  1:33 PM Patient Name: Carolyn Russo MRN: [id number] Epilepsy Attending: Arlin KIDD Shelton Referring  Physician/Provider: Judithe Rocky BROCKS, NP Date: 11/29/2023 Duration: 23.34 mins Patient history: 73yo F with ams. EEG to evaluate for seizure Level of alertness:  comatose AEDs during EEG study: LEV, propofol  Technical aspects: This EEG study was done with scalp electrodes positioned according to the 10-20 International system of electrode placement. Electrical activity was reviewed with band pass filter of 1-70Hz , sensitivity of 7 uV/mm, display speed of 45mm/sec with a 60Hz  notched filter applied as appropriate. EEG data were recorded continuously and digitally stored.  Video monitoring was available and reviewed as appropriate. Description: EEG showed continuous generalized polymorphic sharply contoured 3 to 6 Hz theta-delta slowing.  As sedation was adjusted, sharply contoured waves with triphasic morphology at times qasi-periodic  at 1.5 to 2hz  were also noted. Hyperventilation and  photic stimulation were not performed.   ABNORMALITY - Continuous slow, generalized IMPRESSION: This study is suggestive of moderate to severe diffuse encephalopathy.  No seizures were noted.  As sedation was adjusted, sharply contoured waves with triphasic morphology were noted in left frontal region.  These are most likely due to toxic-metabolic causes.  However, if concern for ictal or interictal activity persist, long-term EEG can be considered.  Arlin MALVA Krebs   MR BRAIN WO CONTRAST Result Date: 11/29/2023 CLINICAL DATA:  73 year old female with respiratory failure, right PICA infarct suspected on MRI last month with abnormal distal right vertebral artery flow void at that time. EXAM: MRI HEAD WITHOUT CONTRAST TECHNIQUE: Multiplanar, multiecho pulse sequences of the brain and surrounding structures were obtained without intravenous contrast. COMPARISON:  Head CT yesterday. Brain MRI and intracranial MRA 11/17/2023. FINDINGS: Brain: Previously seen right cerebellar tonsillar abnormal diffusion has resolved, T2 and FLAIR hyperintensity there has regressed (series 11, image 8) with no associated hemorrhage or mass effect. Contralateral new left central cerebellar, cerebellar peduncle region diffusion restriction (series 5, image 59) with patchy T2 and FLAIR hyperintensity. Nearby chronic microhemorrhage on SWI appears unchanged (series 12, image 19). No acute hemorrhage or mass effect. Other regional brainstem T2 and FLAIR heterogeneity is chronic and stable, in association with right midbrain flow layering in degeneration. No other restricted diffusion. No midline shift, mass effect, evidence of mass lesion, ventriculomegaly, extra-axial collection or acute intracranial hemorrhage. Cervicomedullary junction and pituitary are within normal limits. Occasional chronic microhemorrhages in the supratentorial brain and midbrain are stable. Chronic encephalomalacia in the right deep gray nuclei and midbrain is  stable. And confluent additional bilateral cerebral white matter T2 and FLAIR hyperintensity is stable. Small chronic cortically based infarct with encephalomalacia in the posterior right temporal lobe is stable. Vascular: Major intracranial vascular flow voids are detected including the distal right vertebral artery although diminutive. Skull and upper cervical spine: Chronic cervical spine degeneration but nonspecific abnormally decreased C6 vertebral body marrow signal is partially visible on series 9, image 12 (not included on January MRI). Other visible marrow signal appears stable from January and within normal limits. On recent chest CTA 11/16/2023 the C6 and C7 vertebral bodies appeared partially collapsed or eroded. Sinuses/Orbits: Regressed paranasal sinus fluid levels and inflammation. Stable orbits. Other: Bilateral mastoid air cell fluid has progressed since January. And there are trace layering secretions in the visible nasopharynx. Patient is intubated. IMPRESSION: 1. Resolution of small right PICA cerebellar infarct seen in January. New and Acute central left Cerebellar Infarct with no associated hemorrhage or mass effect. 2. Otherwise stable underlying advanced chronic ischemic disease with some Wallerian degeneration and occasional chronic microhemorrhages. 3. Abnormal but nonspecific partially visible bone marrow signal  of the C6 vertebral body. Is there a known malignancy? Alternatively this might be discitis osteomyelitis related, with subtle partial erosions/collapse of the C6 and C7 vertebral bodies on January chest CTA. Dedicated cerVical spine MRI (without and with contrast preferred) may characterize further. 4. Progressed mastoid air cell effusions in the setting of intubation. Electronically Signed   By: VEAR Hurst M.D.   On: 11/29/2023 04:39   ECHOCARDIOGRAM LIMITED Result Date: 11/28/2023    ECHOCARDIOGRAM LIMITED REPORT   Patient Name:   Carolyn Russo [facility] Date of Exam: 11/28/2023 Medical Rec  #:  [id number]      Height:       63.0 in Accession #:    [id number]     Weight:       156.5 lb Date of Birth:  1951-04-27      BSA:          1.742 m Patient Age:    73 years       BP:           159/50 mmHg Patient Gender: F              HR:           84 bpm. Exam Location:  Inpatient Procedure: Limited Echo, Cardiac Doppler and Limited Color Doppler Indications:    Cardiac Arrest I46.9  History:        Patient has prior history of Echocardiogram examinations, most                 recent 11/17/2023.  Sonographer:    Tinnie Gosling RDCS Referring Phys: [id number] TIFFANY Lerna IMPRESSIONS  1. There is prominent infrerior wall pseudodyskinesis due to increased infradiaphragmatic pressure (distended stomach?). Left ventricular ejection fraction, by estimation, is 55 to 60%. The left ventricle has normal function. There is mild concentric left ventricular hypertrophy.  2. Right ventricular systolic function is normal. The right ventricular size is normal. Tricuspid regurgitation signal is inadequate for assessing PA pressure.  3. Left atrial size was mildly dilated.  4. The mitral valve is normal in structure. Trivial mitral valve regurgitation. No evidence of mitral stenosis.  5. The aortic valve is tricuspid. There is mild thickening of the aortic valve. Aortic valve regurgitation is not visualized. Aortic valve sclerosis is present, with no evidence of aortic valve stenosis.  6. The inferior vena cava is dilated in size with <50% respiratory variability, suggesting right atrial pressure of 15 mmHg. Comparison(s): Prior images reviewed side by side. There is inferior wall pseudodyskinesis, but no new true wall motion abnormality. FINDINGS  Left Ventricle: There is prominent infrerior wall pseudodyskinesis due to increased infradiaphragmatic pressure (distended stomach?). Left ventricular ejection fraction, by estimation, is 55 to 60%. The left ventricle has normal function. The left ventricular internal cavity size was  normal in size. There is mild concentric left ventricular hypertrophy. Right Ventricle: The right ventricular size is normal. Right ventricular systolic function is normal. Tricuspid regurgitation signal is inadequate for assessing PA pressure. Left Atrium: Left atrial size was mildly dilated. Right Atrium: Right atrial size was normal in size. Pericardium: There is no evidence of pericardial effusion. Mitral Valve: The mitral valve is normal in structure. Trivial mitral valve regurgitation. No evidence of mitral valve stenosis. Aortic Valve: The aortic valve is tricuspid. There is mild thickening of the aortic valve. Aortic valve regurgitation is not visualized. Aortic valve sclerosis is present, with no evidence of aortic valve stenosis. Venous: IVC assessment for right atrial pressure unable to  be performed due to mechanical ventilation. The inferior vena cava is dilated in size with less than 50% respiratory variability, suggesting right atrial pressure of 15 mmHg. LEFT VENTRICLE PLAX 2D LVIDd:         4.60 cm LVIDs:         2.90 cm LV PW:         1.30 cm LV IVS:        1.30 cm  IVC IVC diam: 2.40 cm Mihai Croitoru MD Electronically signed by Jerel Balding MD Signature Date/Time: 11/28/2023/3:57:52 PM    Final    CT HEAD WO CONTRAST ( ) Result Date: 11/28/2023 CLINICAL DATA:  Anoxic brain injury EXAM: CT HEAD WITHOUT CONTRAST TECHNIQUE: Contiguous axial images were obtained from the base of the skull through the vertex without intravenous contrast. RADIATION DOSE REDUCTION: This exam was performed according to the departmental dose-optimization program which includes automated exposure control, adjustment of the mA and/or kV according to patient size and/or use of iterative reconstruction technique. COMPARISON:  11/16/2023 FINDINGS: Brain: No evidence of acute infarction, hemorrhage, mass, mass effect, or midline shift. No hydrocephalus or extra-axial fluid collection. Gray-white differentiation is preserved.  The basal cisterns are patent. Periventricular white matter changes, likely the sequela of chronic small vessel ischemic disease. Remote infarcts in the right basal ganglia, right thalamus, and left cerebellum Vascular: No hyperdense vessel. Skull: Negative for fracture or focal lesion. Sinuses/Orbits: Mucosal thickening in the ethmoid air cells and right maxillary sinus. Sequela prior endoscopic sinus surgery. No acute finding in the orbits. Status post bilateral lens replacements. Other: Fluid in the right-greater-than-left mastoid air cells, which may be related to intubation. IMPRESSION: No acute intracranial process. Electronically Signed   By: Donald Campion M.D.   On: 11/28/2023 13:46   DG CHEST PORT 1 VIEW Result Date: 11/28/2023 CLINICAL DATA:  Acute hypoxic respiratory failure EXAM: PORTABLE CHEST 1 VIEW COMPARISON:  11/26/2023 FINDINGS: Endotracheal tube slightly advanced, now 2 cm above the carina. Right dialysis catheter and NG tube remain in place, unchanged. Cardiomegaly with vascular congestion. Interstitial prominence slightly increase could reflect early interstitial edema. Left base atelectasis, similar prior study. No visible effusions or acute bony abnormality. IMPRESSION: Cardiomegaly, vascular congestion. Question early interstitial edema. Left base atelectasis. Electronically Signed   By: Franky Crease M.D.   On: 11/28/2023 12:06   DG CHEST PORT 1 VIEW Result Date: 11/26/2023 CLINICAL DATA:  [id number] Sepsis [facility]) [id number] EXAM: PORTABLE CHEST 1 VIEW COMPARISON:  11/23/2023 FINDINGS: ET tube terminates 4.6 cm above the carina. Right IJ hemodialysis catheter remains positioned at the level of the right atrium. Stable heart size. Aortic atherosclerosis. Similar minor bibasilar atelectasis. No new airspace consolidations. No pleural effusion or pneumothorax. IMPRESSION: 1. ET tube terminates 4.6 cm above the carina. 2. Similar minor bibasilar atelectasis. Electronically Signed   By: Mabel Converse D.O.   On: 11/26/2023 13:52   DG Abd Portable 1V Result Date: 11/23/2023 CLINICAL DATA:  Tube placement EXAM: PORTABLE ABDOMEN - 1 VIEW limited for tube placement COMPARISON:  CT 10/24/2023 FINDINGS: Limited x-ray for tube placement has enteric tube overlying the distal stomach. Stomach is mildly distended with fluid. Minimal bowel gas elsewhere in the visualized abdomen. There is contrast in the colon. Patient had recent swallowing study. Right femoral line also noted at the edge of the imaging field. Curvature and degenerative changes along the spine. IMPRESSION: Enteric tube with tip overlying the distal stomach.  Limited x-ray Electronically Signed   By: Ranell Bring  M.D.   On: 11/23/2023 18:11   DG CHEST PORT 1 VIEW Result Date: 11/23/2023 CLINICAL DATA:  Respiratory failure and status post intubation. EXAM: PORTABLE CHEST 1 VIEW COMPARISON:  11/17/2023 FINDINGS: Endotracheal tube now present with the tip approximately 1.7 cm above the carina. Stable appearance of tunneled dialysis catheter. Pacing pad over the chest. Some improved aeration of both lungs with mild bibasilar atelectasis remaining, left greater than right. No overt pulmonary edema, significant pleural fluid or pneumothorax. IMPRESSION: Endotracheal tube tip approximately 1.7 cm above the carina. Some improved aeration of both lungs with mild bibasilar atelectasis remaining, left greater than right. Electronically Signed   By: Marcey Moan M.D.   On: 11/23/2023 08:27   DG Swallowing Func-Speech Pathology Result Date: 11/21/2023 Table formatting from the original result was not included. Modified Barium Swallow Study Patient Details Name: Carolyn Russo MRN: [id number] Date of Birth: [birth year] Today's Date: 11/21/2023 HPI/PMH: HPI: Patient is a 73 y.o. female with PMH: CVA in 2022 and 2023, right BKA, HTN, DM-2, HLD, ESRD on HD MWF, asthma, dysphagia, CHF, heart murmur, seizure. She presented to the hospital from SNF on 11/16/23 for  chest pain, weakness and confusion. CT head revealed infarct in inferior right cerebellum which was new since 06/12/22. MRI brain with acute right inferomedial cerebellar ischemic infarction in the right PICA territory. Pt also diagnosed with Covid/RSV. Clinical Impression: Clinical Impression: Pt exhibits mild oropharyngeal dysphagia characterized by lingual weakness and impaired sensation. She initiates a swallow response which clears the majority of each bolus from her oral cavity, but does not clear it completely. Pt does not achieve complete BOT retraction and the oral residue progresses through the pharynx to collect in the pyriform sinuses without sensation. All liquid consistencies enters the open laryngeal vestibule, where they are eventually cleared with a cued subsequent swallow or cough (PAS 3). Residue increases with thicker residue, increasing the risk of airway invasion. Of note, pt coughed multiple times throughout the study in the absence of penetration/aspiration. Cueing the pt to immediately initiate a second swallow helped clear the oropharyngeal residue promptly. Biofeedback was used to demonstrate effectiveness of subswallows on function with pt demonstrating understanding. Recommend continuing current diet of Dys 2 solids and thin liquids. SLP will f/u to reinforce use of compensatory strategies. DIGEST Swallow Severity Rating*  Safety: 1  Efficiency: 1  Overall Pharyngeal Swallow Severity: mild 1: mild; 2: moderate; 3: severe; 4: profound *The Dynamic Imaging Grade of Swallowing Toxicity is standardized for the head and neck cancer population, however, demonstrates promising clinical applications across populations to standardize the clinical rating of pharyngeal swallow safety and severity. Factors that may increase risk of adverse event in presence of aspiration Noe & Lianne 2021): Factors that may increase risk of adverse event in presence of aspiration Noe & Lianne 2021): Reduced  cognitive function Recommendations/Plan: Swallowing Evaluation Recommendations Swallowing Evaluation Recommendations Recommendations: PO diet PO Diet Recommendation: Dysphagia 2 (Finely chopped); Thin liquids (Level 0) Liquid Administration via: Cup; Straw Medication Administration: Whole meds with puree Supervision: Staff to assist with self-feeding; Full supervision/cueing for swallowing strategies Swallowing strategies  : Minimize environmental distractions; Slow rate; Small bites/sips; Multiple dry swallows after each bite/sip Postural changes: Position pt fully upright for meals; Stay upright 30-60 min after meals Oral care recommendations: Oral care BID (2x/day) Treatment Plan Treatment Plan Treatment recommendations: Therapy as outlined in treatment plan below Follow-up recommendations: Skilled nursing-short term rehab (<3 hours/day) Functional status assessment: Patient has had a recent decline in their  functional status and demonstrates the ability to make significant improvements in function in a reasonable and predictable amount of time. Treatment frequency: Min 2x/week Treatment duration: 1 week Interventions: Aspiration precaution training; Compensatory techniques; Patient/family education Recommendations Recommendations for follow up therapy are one component of a multi-disciplinary discharge planning process, led by the attending physician.  Recommendations may be updated based on patient status, additional functional criteria and insurance authorization. Assessment: Orofacial Exam: Orofacial Exam Oral Cavity: Oral Hygiene: WFL Oral Cavity - Dentition: Adequate natural dentition Orofacial Anatomy: WFL Oral Motor/Sensory Function: WFL Anatomy: Anatomy: Suspected cervical osteophytes Boluses Administered: Boluses Administered Boluses Administered: Thin liquids (Level 0); Mildly thick liquids (Level 2, nectar thick); Moderately thick liquids (Level 3, honey thick); Puree; Solid  Oral Impairment Domain:  Oral Impairment Domain Lip Closure: No labial escape Tongue control during bolus hold: Posterior escape of greater than half of bolus Bolus preparation/mastication: Timely and efficient chewing and mashing Bolus transport/lingual motion: Brisk tongue motion Oral residue: Residue collection on oral structures Location of oral residue : Tongue Initiation of pharyngeal swallow : Pyriform sinuses  Pharyngeal Impairment Domain: Pharyngeal Impairment Domain Soft palate elevation: No bolus between soft palate (SP)/pharyngeal wall (PW) Laryngeal elevation: Complete superior movement of thyroid  cartilage with complete approximation of arytenoids to epiglottic petiole Anterior hyoid excursion: Partial anterior movement Epiglottic movement: Complete inversion Laryngeal vestibule closure: Complete, no air/contrast in laryngeal vestibule Pharyngeal stripping wave : Present - complete Pharyngeal contraction (A/P view only): N/A Pharyngoesophageal segment opening: Complete distension and complete duration, no obstruction of flow Tongue base retraction: Narrow column of contrast or air between tongue base and PPW Pharyngeal residue: Collection of residue within or on pharyngeal structures Location of pharyngeal residue: Valleculae; Aryepiglottic folds; Pyriform sinuses  Esophageal Impairment Domain: No data recorded Pill: No data recorded Penetration/Aspiration Scale Score: Penetration/Aspiration Scale Score 1.  Material does not enter airway: Solid 2.  Material enters airway, remains ABOVE vocal cords then ejected out: Moderately thick liquids (Level 3, honey thick); Puree 3.  Material enters airway, remains ABOVE vocal cords and not ejected out: Thin liquids (Level 0); Mildly thick liquids (Level 2, nectar thick) Compensatory Strategies: Compensatory Strategies Compensatory strategies: Yes Straw: Effective Effective Straw: Thin liquid (Level 0); Mildly thick liquid (Level 2, nectar thick) Chin tuck: Effective Effective Chin Tuck:  Thin liquid (Level 0)   General Information: Caregiver present: No  Diet Prior to this Study: Thin liquids (Level 0); Dysphagia 2 (finely chopped)   Temperature : Normal   Respiratory Status: WFL   Supplemental O2: Nasal cannula   History of Recent Intubation: No  Behavior/Cognition: Alert; Cooperative; Pleasant mood; Confused Self-Feeding Abilities: Needs assist with self-feeding Baseline vocal quality/speech: Normal Volitional Cough: Able to elicit Volitional Swallow: Able to elicit Exam Limitations: No limitations Goal Planning: Prognosis for improved oropharyngeal function: Good Barriers to Reach Goals: Cognitive deficits; Time post onset No data recorded Patient/Family Stated Goal: none stated Consulted and agree with results and recommendations: Patient Pain: Pain Assessment Pain Assessment: Faces Faces Pain Scale: 2 Pain Location: chest and back Pain Descriptors / Indicators: Aching; Discomfort Pain Intervention(s): Monitored during session End of Session: Start Time:SLP Start Time (ACUTE ONLY): 1511 Stop Time: SLP Stop Time (ACUTE ONLY): 1541 Time Calculation:SLP Time Calculation (min) (ACUTE ONLY): 30 min Charges: SLP Evaluations $ SLP Speech Visit: 1 Visit SLP Evaluations $MBS Swallow: 1 Procedure $Swallowing Treatment: 1 Procedure $Speech Treatment for Individual: 1 Procedure SLP visit diagnosis: SLP Visit Diagnosis: Dysphagia, oropharyngeal phase (R13.12) Past Medical History: Past Medical  History: Diagnosis Date  Acute GI bleeding   Allergy   Anemia   Anterior chest wall pain   Appendicitis 1965  Asthma   Body mass index 37.0-37.9, adult   Breast pain   Cataract   both eyes  CHF (congestive heart failure) (HCC)   Cognitive change 04/20/2021  r/t cva 03/2821  Complication of anesthesia   Memory loss after general  Dehydration 2014  Deviated septum 1971  Diabetes mellitus   Dysphagia due to old stroke   easy to get strangled when eating  Dyspnea 2014  ESRD on hemodialysis (HCC)   MWF at [facility]  Extrinsic  asthma   WITH ASTHMA ATTACK  Fibroid 1980  GERD (gastroesophageal reflux disease)   Heart murmur   History of migraine   History of seizure   with stroke  Hx gestational diabetes   Hyperlipidemia   Hypertension 2014  Inguinal hernia 1959  Malaise and fatigue 2014  Non-IgE mediated allergic asthma 2014  Obesity   Pelvic pain   Pregnancy, high-risk 1985  Stroke (HCC) 04/20/2021  (CVA) of right basal ganglia  Tonsillitis 1968  Uterine fibroid 1980  Visual field defect   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 Past Surgical History: Past Surgical History: Procedure Laterality Date  ABDOMINAL AORTOGRAM W/LOWER EXTREMITY N/A 02/21/2022  Procedure: ABDOMINAL AORTOGRAM W/LOWER EXTREMITY;  Surgeon: Sheree Penne Bruckner, MD;  Location: [facility] INVASIVE CV LAB;  Service: Cardiovascular;  Laterality: N/A;  AMPUTATION Right 05/18/2022  Procedure: RIGHT BELOW KNEE AMPUTATION;  Surgeon: Harden Jerona GAILS, MD;  Location: [facility] OR;  Service: Orthopedics;  Laterality: Right;  AMPUTATION TOE Right 04/15/2022  Procedure: AMPUTATION  LST TOERIGHT FOOT;  Surgeon: Silva Juliene SAUNDERS, DPM;  Location: [facility] ORS;  Service: Podiatry;  Laterality: Right;  APPENDECTOMY  1959  BASCILIC VEIN TRANSPOSITION Right 04/30/2021  Procedure: RIGHT FIRST STAGE BASCILIC VEIN TRANSPOSITION;  Surgeon: Eliza Bruckner RAMAN, MD;  Location: [facility] OR;  Service: Vascular;  Laterality: Right;  CESAREAN SECTION  1985  COLONOSCOPY    ESOPHAGOGASTRODUODENOSCOPY (EGD) WITH PROPOFOL  N/A 04/18/2021  Procedure: ESOPHAGOGASTRODUODENOSCOPY (EGD) WITH PROPOFOL ;  Surgeon: Avram Lupita BRAVO, MD;  Location: [facility] ENDOSCOPY;  Service: Endoscopy;  Laterality: N/A;  EYE SURGERY    bilateral cataract   FLEXIBLE SIGMOIDOSCOPY N/A 04/18/2021  Procedure: FLEXIBLE SIGMOIDOSCOPY;  Surgeon: Avram Lupita BRAVO, MD;  Location: [facility] ENDOSCOPY;  Service: Endoscopy;  Laterality: N/A;  HERNIA REPAIR  1959  IR FLUORO GUIDE CV LINE RIGHT  03/18/2021  IR US  GUIDE VASC ACCESS RIGHT  03/18/2021  LEFT HEART CATH AND CORONARY ANGIOGRAPHY N/A  04/04/2017  Procedure: Left Heart Cath and Coronary Angiography;  Surgeon: Claudene Victory ORN, MD;  Location: [facility] INVASIVE CV LAB;  Service: Cardiovascular;  Laterality: N/A;  MYOMECTOMY  1980, 2004, 2007  PERIPHERAL VASCULAR THROMBECTOMY  02/21/2022  Procedure: PERIPHERAL VASCULAR THROMBECTOMY;  Surgeon: Sheree Penne Bruckner, MD;  Location: [facility] INVASIVE CV LAB;  Service: Cardiovascular;;  Right Tibial  RHINOPLASTY  1971  ROTATOR CUFF REPAIR  2003  SURGICAL REPAIR OF HEMORRHAGE  2015  TONSILLECTOMY  1968 Damien Blumenthal, M.A., CF-SLP Speech Language Pathology, Acute Rehabilitation Services Secure Chat preferred [phone] 11/21/2023, 4:24 PM  VAS US  CAROTID Result Date: 11/20/2023 Carotid Arterial Duplex Study Patient Name:  AMANII SNETHEN [facility]  Date of Exam:   11/19/2023 Medical Rec #: [id number]       Accession #:    [id number] Date of Birth: Mar 18, 1951       Patient Gender: F Patient Age:   7 years Exam Location:  [facility] Procedure:      VAS US  CAROTID Referring Phys: ELVAN SOR --------------------------------------------------------------------------------  Indications:       CVA. Risk Factors:      Hypertension, hyperlipidemia, Diabetes. Other Factors:     Covid and RSV positive. ESRD, on dialysis. Right BKA. Seizure                    disorder. Limitations        Today's exam was limited due to the patient's respiratory                    variation and the patient's inability or unwillingness to                    cooperate. Comparison Study:  Prior carotid duplex done 06/04/22 indicated bilateral 1-39%                    ICA stenosis Performing Technologist: Alberta Lis RVS  Examination Guidelines: A complete evaluation includes B-mode imaging, spectral Doppler, color Doppler, and power Doppler as needed of all accessible portions of each vessel. Bilateral testing is considered an integral part of a complete examination. Limited examinations for reoccurring indications may be performed as noted.  Right  Carotid Findings: +----------+--------+--------+--------+------------------+--------+           PSV cm/sEDV cm/sStenosisPlaque DescriptionComments +----------+--------+--------+--------+------------------+--------+ CCA Distal77      14                                         +----------+--------+--------+--------+------------------+--------+ ECA       75      10                                         +----------+--------+--------+--------+------------------+--------+ Unable to adequately visualize vessels secondary to noncooperative patient Left Carotid Findings: +----------+--------+--------+--------+------------------+------------------+           PSV cm/sEDV cm/sStenosisPlaque DescriptionComments           +----------+--------+--------+--------+------------------+------------------+ CCA Prox  97      11                                intimal thickening +----------+--------+--------+--------+------------------+------------------+ CCA Distal67      11                                intimal thickening +----------+--------+--------+--------+------------------+------------------+ ICA Prox  99      19              heterogenous                         +----------+--------+--------+--------+------------------+------------------+ ICA Mid   96      22                                                   +----------+--------+--------+--------+------------------+------------------+ ICA Distal78      19                                                   +----------+--------+--------+--------+------------------+------------------+  ECA       83      9                                                    +----------+--------+--------+--------+------------------+------------------+ +----------+--------+--------+--------+-------------------+           PSV cm/sEDV cm/sDescribeArm Pressure (mmHG) +----------+--------+--------+--------+-------------------+  [id number]                                         +----------+--------+--------+--------+-------------------+ +---------+--------+--+--------+--+ VertebralPSV cm/s66EDV cm/s10 +---------+--------+--+--------+--+   Summary: Right Carotid: Unable to assess. Left Carotid: Velocities in the left ICA are consistent with a 1-39% stenosis. Vertebrals:  Left vertebral artery demonstrates antegrade flow. Subclavians: Normal flow hemodynamics were seen in the left subclavian artery. *See table(s) above for measurements and observations.  Electronically signed by Eather Popp MD on 11/20/2023 at 2:01:25 PM.    Final    ECHOCARDIOGRAM LIMITED Result Date: 11/17/2023    ECHOCARDIOGRAM LIMITED REPORT   Patient Name:   Carolyn Russo [facility] Date of Exam: 11/17/2023 Medical Rec #:  [id number]      Height:       63.0 in Accession #:    [id number]     Weight:       175.3 lb Date of Birth:  23-Jul-1951      BSA:          1.828 m Patient Age:    73 years       BP:           154/88 mmHg Patient Gender: F              HR:           93 bpm. Exam Location:  Inpatient Procedure: Limited Echo, Limited Color Doppler and Cardiac Doppler Indications:    stroke  History:        Patient has prior history of Echocardiogram examinations, most                 recent 10/25/2023. CAD, end stage renal disease; Risk                 Factors:Hypertension.  Sonographer:    Tinnie Barefoot RDCS Referring Phys: [id number] [facility] GOEL IMPRESSIONS  1. Left ventricular ejection fraction, by estimation, is 55 to 60%. The left ventricle has normal function. The left ventricle has no regional wall motion abnormalities. Elevated left ventricular end-diastolic pressure.  2. Right ventricular systolic function is normal. The right ventricular size is normal. There is severely elevated pulmonary artery systolic pressure.  3. The mitral valve is normal in structure. Trivial mitral valve regurgitation. No evidence of mitral stenosis.  4. The aortic valve is  tricuspid. There is mild calcification of the aortic valve. Aortic valve regurgitation is not visualized. No aortic stenosis is present. Aortic valve mean gradient measures 11.0 mmHg. Aortic valve Vmax measures 2.16 m/s.  5. The inferior vena cava is dilated in size with <50% respiratory variability, suggesting right atrial pressure of 15 mmHg. FINDINGS  Left Ventricle: Left ventricular ejection fraction, by estimation, is 55 to 60%. The left ventricle has normal function. The left ventricle has no regional wall motion abnormalities. The left ventricular internal cavity size was normal in size. There is  no left ventricular hypertrophy. Elevated left ventricular end-diastolic pressure. Right Ventricle: The right ventricular size is normal. No increase in right ventricular wall thickness. Right ventricular systolic function is normal. There is severely elevated pulmonary artery systolic pressure. The tricuspid regurgitant velocity is 3.47 m/s, and with an assumed right atrial pressure of 15 mmHg, the estimated right ventricular systolic pressure is 63.2 mmHg. Left Atrium: Left atrial size was normal in size. Right Atrium: Right atrial size was normal in size. Pericardium: There is no evidence of pericardial effusion. Mitral Valve: The mitral valve is normal in structure. Mild mitral annular calcification. Trivial mitral valve regurgitation. No evidence of mitral valve stenosis. Tricuspid Valve: The tricuspid valve is normal in structure. Tricuspid valve regurgitation is mild . No evidence of tricuspid stenosis. Aortic Valve: The aortic valve is tricuspid. There is mild calcification of the aortic valve. Aortic valve regurgitation is not visualized. No aortic stenosis is present. Aortic valve mean gradient measures 11.0 mmHg. Aortic valve peak gradient measures 18.7 mmHg. Pulmonic Valve: The pulmonic valve was normal in structure. Pulmonic valve regurgitation is not visualized. No evidence of pulmonic stenosis. Aorta:  The aortic root is normal in size and structure. Venous: The inferior vena cava is dilated in size with less than 50% respiratory variability, suggesting right atrial pressure of 15 mmHg. IAS/Shunts: No atrial level shunt detected by color flow Doppler. Additional Comments: Spectral Doppler performed. Color Doppler performed.   Diastology LV e' medial:    5.55 cm/s LV E/e' medial:  22.2 LV e' lateral:   8.92 cm/s LV E/e' lateral: 13.8  IVC IVC diam: 2.40 cm AORTIC VALVE AV Vmax:           216.00 cm/s AV Vmean:          152.000 cm/s AV VTI:            0.407 m AV Peak Grad:      18.7 mmHg AV Mean Grad:      11.0 mmHg LVOT Vmax:         110.00 cm/s LVOT Vmean:        73.700 cm/s LVOT VTI:          0.210 m LVOT/AV VTI ratio: 0.52  AORTA Ao Asc diam: 2.80 cm MITRAL VALVE                TRICUSPID VALVE MV Area (PHT): 4.80 cm     TR Peak grad:   48.2 mmHg MV Decel Time: 158 msec     TR Vmax:        347.00 cm/s MV E velocity: 123.00 cm/s MV A velocity: 119.00 cm/s  SHUNTS MV E/A ratio:  1.03         Systemic VTI: 0.21 m Annabella Scarce MD Electronically signed by Annabella Scarce MD Signature Date/Time: 11/17/2023/6:14:59 PM    Final    MR ANGIO HEAD WO CONTRAST Result Date: 11/17/2023 CLINICAL DATA:  Neuro deficit, acute, stroke suspected. Right PICA infarct and abnormal appearance of the distal right vertebral artery on MRI. EXAM: MRA HEAD WITHOUT CONTRAST TECHNIQUE: Angiographic images of the Circle of Willis were acquired using MRA technique without intravenous contrast. COMPARISON:  Head MRI 11/16/2023. Head and neck CTA 06/22/2022. Head MRA 06/03/2022. FINDINGS: The study is motion degraded, moderately so towards the skull base Anterior circulation: The internal carotid arteries are widely patent from skull base to carotid termini. ACAs and MCAs are patent without evidence of a proximal branch occlusion or significant A1 or M1  stenosis. No aneurysm is identified. Posterior circulation: The included portion of the  intracranial left vertebral artery is widely patent and dominant. The included intracranial right vertebral artery is also patent but is diffusely small distally which reflects a change from the prior CTA. Patent AICA and SCA origins are visualized bilaterally. The basilar artery is patent with decreased signal through its mid to distal portions attributed to motion artifact based on source images. There are large posterior communicating arteries bilaterally. The PCAs are patent with severe bilateral P2 stenoses, likely progressed. No aneurysm is identified. Anatomic variants: None. IMPRESSION: 1. Motion degraded examination. 2. No large vessel occlusion. 3. Severe bilateral P2 stenoses. 4. Patent but diffusely narrowed distal right vertebral artery. Electronically Signed   By: Dasie Hamburg M.D.   On: 11/17/2023 17:09   EEG adult Result Date: 11/17/2023 Shelton Arlin KIDD, MD     11/17/2023 10:44 AM Patient Name: Carolyn Russo MRN: [id number] Epilepsy Attending: Arlin KIDD Shelton Referring Physician/Provider: Merrianne Locus, MD Date: 11/17/2023 Duration: 23.09 mins Patient history: 73 year old female with history of seizures who presented with altered mental status.  EEG to evaluate for seizure. Level of alertness: Awake AEDs during EEG study: LEV Technical aspects: This EEG study was done with scalp electrodes positioned according to the 10-20 International system of electrode placement. Electrical activity was reviewed with band pass filter of 1-70Hz , sensitivity of 7 uV/mm, display speed of 80mm/sec with a 60Hz  notched filter applied as appropriate. EEG data were recorded continuously and digitally stored.  Video monitoring was available and reviewed as appropriate. Description: The posterior dominant rhythm consists of 8 Hz activity of moderate voltage (25-35 uV) seen predominantly in posterior head regions, symmetric and reactive to eye opening and eye closing. EEG showed intermittent generalized 3 to 6 Hz  theta-delta slowing.spikes were noted in right temporoparietal region. Hyperventilation and photic stimulation were not performed.   Of note, study was technically difficult due to significant movement artifact. 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  Additionally there is mild diffuse encephalopathy. No seizures were seen throughout the recording. Arlin KIDD Shelton   MR BRAIN WO CONTRAST Result Date: 11/17/2023 CLINICAL DATA:  Initial evaluation for acute TIA. EXAM: MRI HEAD WITHOUT CONTRAST TECHNIQUE: Multiplanar, multiecho pulse sequences of the brain and surrounding structures were obtained without intravenous contrast. COMPARISON:  Prior CT from earlier the same day as well as prior CTA from 06/22/2022. FINDINGS: Brain: Examination degraded by motion artifact. Diffuse prominence of the CSF containing spaces compatible with atrophy, most pronounced about the frontal lobes bilaterally. Patchy and confluent T2/FLAIR hyperintensity involving the periventricular deep white matter both cerebral hemispheres, consistent with chronic small vessel ischemic disease, moderate to advanced in nature. Patchy involvement of the pons noted. Remote lacunar infarct present at the right basal ganglia. Associated wallerian degeneration noted. Remote lacunar infarct noted within the left cerebellum as well. A 1.3 cm focus of diffusion signal abnormality involving the inferior right cerebellum, consistent with an acute to early subacute right PICA distribution infarct. No associated hemorrhage or significant mass effect. No other evidence for acute or subacute ischemia. No acute intracranial hemorrhage. Few scattered chronic micro hemorrhages noted, likely hypertensive in nature. No mass lesion, midline shift or mass effect. Mild ventricular prominence related to global  parenchymal volume loss without hydrocephalus. No extra-axial fluid collection. Pituitary gland suprasellar region within normal limits. Vascular: Loss of normal flow void  within the intradural right V4 segment, which could reflect slow flow and/or occlusion. This appears to be new from prior CTA from 06/22/2022. Major intracranial vascular flow voids are otherwise maintained. Skull and upper cervical spine: Craniocervical junction within normal limits. Decreased T1 signal intensity noted within the visualized bone marrow, nonspecific, but most commonly related to anemia, smoking or obesity. No scalp soft tissue abnormality. Sinuses/Orbits: Bilateral ocular lens replacement with axial myopia. Scattered mucosal thickening noted about the paranasal sinuses. Superimposed air-fluid level within the right maxillary sinus. Small to moderate bilateral mastoid effusions. Negative nasopharynx. Other: None. IMPRESSION: 1. 1.3 cm acute to early subacute right PICA distribution infarct. No associated hemorrhage or significant mass effect. 2. Loss of normal flow void within the intradural right V4 segment, which could reflect slow flow and/or occlusion. This appears to be new from prior CTA from 06/22/2022. 3. Underlying moderate to advanced chronic microvascular ischemic disease with remote lacunar infarcts involving the right basal ganglia and left cerebellum. 4. Underlying atrophy, most pronounced about the frontal lobes bilaterally. 5. Paranasal sinus disease with air-fluid level within the right maxillary sinus. Clinical correlation for possible acute sinusitis recommended. Electronically Signed   By: Morene Hoard M.D.   On: 11/17/2023 00:40   DG Chest Portable 1 View Result Date: 11/17/2023 CLINICAL DATA:  Altered mental status EXAM: PORTABLE CHEST 1 VIEW COMPARISON:  11/16/2023 FINDINGS: Right dialysis catheter is unchanged. Cardiomegaly with vascular congestion and mild interstitial and airspace opacities  compatible with edema. No effusions or acute bony abnormality. IMPRESSION: Cardiomegaly, vascular congestion.  Mild pulmonary edema. Electronically Signed   By: Franky Crease M.D.   On: 11/17/2023 00:26   CT Angio Chest PE W and/or Wo Contrast Result Date: 11/16/2023 CLINICAL DATA:  Chest pain, Pulmonary embolism (PE) suspected, high prob EXAM: CT ANGIOGRAPHY CHEST WITH CONTRAST TECHNIQUE: Multidetector CT imaging of the chest was performed using the standard protocol during bolus administration of intravenous contrast. Multiplanar CT image reconstructions and MIPs were obtained to evaluate the vascular anatomy. RADIATION DOSE REDUCTION: This exam was performed according to the departmental dose-optimization program which includes automated exposure control, adjustment of the mA and/or kV according to patient size and/or use of iterative reconstruction technique. CONTRAST:  75mL OMNIPAQUE  IOHEXOL  350 MG/ML SOLN COMPARISON:  11/16/2023, 10/24/2023 FINDINGS: Cardiovascular: This is a technically adequate evaluation of the pulmonary vasculature. No filling defects or pulmonary emboli. The heart is enlarged without pericardial effusion. No evidence of thoracic aortic aneurysm or dissection. Stable aortic valve calcification, aortic atherosclerosis, and atherosclerosis throughout the coronary vasculature. Right internal jugular dialysis catheter tip within the atriocaval junction. Mediastinum/Nodes: Stable right hilar adenopathy measuring up to 11 mm in short axis. Thyroid , trachea, and esophagus are unremarkable. Small hiatal hernia. Lungs/Pleura: There are dependent areas of consolidation within the lungs bilaterally, favoring hypoventilatory changes over airspace disease. Trace bilateral pleural fluid. No pneumothorax. The central airways are patent. Upper Abdomen: No acute abnormality. Musculoskeletal: No acute or destructive bony abnormalities. Reconstructed images demonstrate no additional findings. Review of the  MIP images confirms the above findings. IMPRESSION: 1. No evidence of pulmonary embolus. 2. Dependent areas of consolidation within the lungs bilaterally, favor hypoventilatory changes over airspace disease. 3. Trace bilateral pleural effusions. 4. Aortic Atherosclerosis (ICD10-I70.0). Coronary artery atherosclerosis. Electronically Signed   By: Ozell Daring M.D.   On: 11/16/2023 19:07   CT HEAD WO CONTRAST ( ) Result Date: 11/16/2023 CLINICAL DATA:  Head trauma, minor (Age >= 65y) EXAM: CT HEAD WITHOUT CONTRAST TECHNIQUE: Contiguous axial  images were obtained from the base of the skull through the vertex without intravenous contrast. RADIATION DOSE REDUCTION: This exam was performed according to the departmental dose-optimization program which includes automated exposure control, adjustment of the mA and/or kV according to patient size and/or use of iterative reconstruction technique. COMPARISON:  CT head 06/22/2022. FINDINGS: Brain: Infarct in the inferior right cerebellum which is new since the 06/12/2022. No evidence of acute hemorrhage, hydrocephalus, extra-axial collection or mass lesion/mass effect. Similar patchy white matter hypodensities, nonspecific but compatible with chronic microvascular ischemic disease. Similar remote perforator infarct in the right basal ganglia. Vascular: Calcific atherosclerosis.  No hyperdense vessel. Skull: No acute fracture. Sinuses/Orbits: Moderate right maxillary sinus mucosal thickening. No acute orbital findings. Other: No mastoid effusions. IMPRESSION: 1. Infarct in the inferior right cerebellum which is new since 06/12/2022 but otherwise age indeterminate. If there is concern for acute infarct, recommend MRI head. 2. No acute hemorrhage. 3. Similar chronic microvascular ischemic changes. Electronically Signed   By: Gilmore GORMAN Molt M.D.   On: 11/16/2023 19:06   DG Shoulder Left Result Date: 11/16/2023 CLINICAL DATA:  Left arm pain status post fall. EXAM: LEFT  SHOULDER - 2+ VIEW COMPARISON:  None Available. FINDINGS: There is no evidence of acute fracture or dislocation. Mild degenerative changes of the glenohumeral and acromioclavicular joints. IMPRESSION: No acute osseous abnormality. Electronically Signed   By: [name] Sherry M.D.   On: 11/16/2023 14:54   DG Chest 1 View Result Date: 11/16/2023 CLINICAL DATA:  Chest pain. EXAM: CHEST  1 VIEW COMPARISON:  Radiographs 10/24/2023 and 02/12/2023.  CT 10/24/2023. FINDINGS: 1157 hours. Right IJ hemodialysis catheter projects over the lower right atrium. Stable cardiomegaly and vascular congestion. There is chronic scarring or atelectasis at the left lung base. No edema, confluent airspace disease, significant pleural effusion or pneumothorax. The bones appear unchanged. Telemetry leads overlie the chest. IMPRESSION: Stable cardiomegaly and vascular congestion. No evidence of acute cardiopulmonary process. Electronically Signed   By: Elsie Perone M.D.   On: 11/16/2023 12:14     PHYSICAL EXAM  Temp:  [98.5 F (36.9 C)-100.7 F (38.2 C)] 100.7 F (38.2 C) (02/06 1938) Pulse Rate:  [47-88] 74 (02/06 1815) Resp:  [10-27] 27 (02/06 1815) BP: (79-190)/(33-103) 123/50 (02/06 1800) SpO2:  [98 %-100 %] 98 % (02/06 1815) Arterial Line BP: (64-215)/(36-88) 148/51 (02/06 1815) FiO2 (%):  [30 %] 30 % (02/06 2115) Weight:  [70.6 kg] 70.6 kg (02/06 1228)  General - Well nourished, well developed, intubated on precedex  with fentanyl  bolus intermittently  Ophthalmologic - fundi not visualized due to noncooperation.  Cardiovascular - Regular rate and rhythm.  Neuro - intubated on on precedex  with fentanyl  bolus intermittently, eyes closed, not following commands. With forced eye opening, eyes in mid position, not blinking to visual threat, doll's eyes very sluggish, not tracking, papillary reflex sluggish. Corneal reflex weakly present on the both sides, gag and cough strongly present. Breathing  over the vent.   Facial symmetry not able to test due to ET tube.  Tongue protrusion not cooperative. On pain stimulation, only slightly withdraw on the left LE. Sensation, coordination and gait not tested.     ASSESSMENT/PLAN Ms. Analyse EMAGENE MERFELD is a 73 y.o. female with history of ESRD on hemodialysis, CHF, diabetes, right BKA, stroke, seizure, obesity, hypertension and hyperlipidemia admitted for chest pain, confusion, and respiratory failure. No TNK given due to recent stroke.    Severe encephalopathy Etiology likely due to respiratory failure, ESRD, COVID/RSV infection, hypotension and  anemia EEG this admission moderate to severe diffuse encephalopathy Stroke likely not the cause for current mental status Continue treat underlying disease by CCM.  Stroke:  right then left cerebellar infarct likely secondary to small vessel disease source CT 1/23 right inferior Cerebellar infarct MRI 1/24 acute to early subacute right PICA distribution infarct MRA 1/24 intradural right V4 occlusion CT 2/4 no acute finding MRI 2/5 resolution of small right PICA infarct but new and acute central left cerebellar infarct. Carotid Doppler unremarkable 2D Echo 55 to 60% LDL 37 HgbA1c 5.8 Heparin  subcu for VTE prophylaxis Aspirin  and Brilinta  prior to admission, now on aspirin  81 mg daily and Brilinta  (ticagrelor ) 90 mg bid.  Ongoing aggressive stroke risk factor management Therapy recommendations: Pending Disposition: Pending  Respiratory failure COVID and RSV infection Intubated on precedex  with fentanyl  bolus intermittently Difficulty lighting up sedation due to severe gag cough and double stacking on vent Ventilation management per CCM May need tracheostomy per CCM  Diabetes HgbA1c 5.8 goal < 7.0 Controlled CBG monitoring SSI DM education and close PCP follow up  History of seizure/stroke 03/2021 admitted for left-sided weakness.  Found to have right subcortical infarct at BG/CR.  EF 50%.  MRA head and neck  unremarkable.  LDL 70, A1c 5.2.  Discharged on DAPT and Crestor  20. 04/2021 admitted for altered mental status.  CT, CTA head and neck negative.  MRI no new infarct.  EEG showed right central spikes.  Put on Keppra  500 nightly 05/2022 admitted for altered mental status.  MRI showed left CR and right parietal small infarcts.  Carotid Doppler negative, MRI head negative.  LDL 39, A1c 4.6.  Discharged on aspirin  Brilinta .  EEG showed increased amount of irritability, increase Keppra  to 750 twice daily. EEG this admission moderate to severe diffuse encephalopathy, no seizure. Continue Keppra  500 twice daily IV.  Hx of hypertension Hypotension On pressor, Levophed  On midodrine  Long term BP goal normotensive  Fever Tmax 100.7 Leukocytosis WBC 20.1->16.8 UA pending CXR pending  Hyperlipidemia Home meds: Crestor  20 LDL 37, goal < 70 Now on Crestor  10, maximum dose given renal function Continue statin at discharge  Other Stroke Risk Factors Advanced age CHF PVD with right BKA  Other Active Problems ESRD on hemodialysis Anemia, hemoglobin 8.7->8.0  Hospital day # 14  This patient is critically ill due to respiratory failure, COVID, RSV, ESRD on dialysis, stroke and at significant risk of neurological worsening, death form sepsis, septic shock, heart failure, renal failure. This patient's care requires constant monitoring of vital signs, hemodynamics, respiratory and cardiac monitoring, review of multiple databases, neurological assessment, discussion with family, other specialists and medical decision making of high complexity. I spent 30 minutes of neurocritical care time in the care of this patient.    Ary Cummins, MD PhD Stroke Neurology 11/30/2023 10:07 PM    To contact Stroke Continuity provider, please refer to [email or web address]. After hours, contact General Neurology

## 2023-12-01 ENCOUNTER — Inpatient Hospital Stay (HOSPITAL_COMMUNITY): Payer: Medicare PPO

## 2023-12-01 DIAGNOSIS — I469 Cardiac arrest, cause unspecified: Secondary | ICD-10-CM | POA: Diagnosis not present

## 2023-12-01 DIAGNOSIS — D638 Anemia in other chronic diseases classified elsewhere: Secondary | ICD-10-CM

## 2023-12-01 DIAGNOSIS — I10 Essential (primary) hypertension: Secondary | ICD-10-CM | POA: Diagnosis not present

## 2023-12-01 DIAGNOSIS — U071 COVID-19: Secondary | ICD-10-CM | POA: Diagnosis not present

## 2023-12-01 DIAGNOSIS — G9340 Encephalopathy, unspecified: Secondary | ICD-10-CM | POA: Diagnosis not present

## 2023-12-01 LAB — URINALYSIS, COMPLETE (UACMP) WITH MICROSCOPIC
Bilirubin Urine: NEGATIVE
Glucose, UA: 100 mg/dL — AB
Ketones, ur: NEGATIVE mg/dL
Nitrite: POSITIVE — AB
Protein, ur: >300 mg/dL — AB
RBC / HPF: >50 RBC/hpf (ref 0–5)
Specific Gravity, Urine: 1.02 (ref 1.005–1.030)
pH: 6.5 (ref 5.0–8.0)

## 2023-12-01 LAB — RENAL FUNCTION PANEL
Albumin: 2 g/dL — ABNORMAL LOW (ref 3.5–5.0)
Anion gap: 10 (ref 5–15)
BUN: 17 mg/dL (ref 8–23)
CO2: 28 mmol/L (ref 22–32)
Calcium: 8.6 mg/dL — ABNORMAL LOW (ref 8.9–10.3)
Chloride: 96 mmol/L — ABNORMAL LOW (ref 98–111)
Creatinine, Ser: 3.57 mg/dL — ABNORMAL HIGH (ref 0.44–1.00)
GFR, Estimated: 13 mL/min — ABNORMAL LOW (ref >60)
Glucose, Bld: 130 mg/dL — ABNORMAL HIGH (ref 70–99)
Phosphorus: 2.3 mg/dL — ABNORMAL LOW (ref 2.5–4.6)
Potassium: 4.2 mmol/L (ref 3.5–5.1)
Sodium: 134 mmol/L — ABNORMAL LOW (ref 135–145)

## 2023-12-01 LAB — CBC
HCT: 22.8 % — ABNORMAL LOW (ref 36.0–46.0)
Hemoglobin: 7.8 g/dL — ABNORMAL LOW (ref 12.0–15.0)
MCH: 29.7 pg (ref 26.0–34.0)
MCHC: 34.2 g/dL (ref 30.0–36.0)
MCV: 86.7 fL (ref 80.0–100.0)
Platelets: 391 10*3/uL (ref 150–400)
RBC: 2.63 MIL/uL — ABNORMAL LOW (ref 3.87–5.11)
RDW: 17.4 % — ABNORMAL HIGH (ref 11.5–15.5)
WBC: 19.2 10*3/uL — ABNORMAL HIGH (ref 4.0–10.5)
nRBC: 0.1 % (ref 0.0–0.2)

## 2023-12-01 LAB — GLUCOSE, CAPILLARY
Glucose-Capillary: 115 mg/dL — ABNORMAL HIGH (ref 70–99)
Glucose-Capillary: 175 mg/dL — ABNORMAL HIGH (ref 70–99)
Glucose-Capillary: 129 mg/dL — ABNORMAL HIGH (ref 70–99)
Glucose-Capillary: 99 mg/dL (ref 70–99)
Glucose-Capillary: 161 mg/dL — ABNORMAL HIGH (ref 70–99)
Glucose-Capillary: 148 mg/dL — ABNORMAL HIGH (ref 70–99)

## 2023-12-01 LAB — IRON AND TIBC
Iron: 28 ug/dL (ref 28–170)
Saturation Ratios: 26 % (ref 10.4–31.8)
TIBC: 108 ug/dL — ABNORMAL LOW (ref 250–450)
UIBC: 80 ug/dL

## 2023-12-01 LAB — MAGNESIUM: Magnesium: 2.2 mg/dL (ref 1.7–2.4)

## 2023-12-01 LAB — FERRITIN: Ferritin: 1638 ng/mL — ABNORMAL HIGH (ref 11–307)

## 2023-12-01 MED ORDER — DARBEPOETIN ALFA 150 MCG/0.3ML IJ SOSY
150.0000 ug | PREFILLED_SYRINGE | INTRAMUSCULAR | Status: DC
  Administered 2023-12-01: 150 ug via SUBCUTANEOUS
  Filled 2023-12-01 (×3): qty 0.3

## 2023-12-01 MED ORDER — METOCLOPRAMIDE HCL 5 MG/ML IJ SOLN
INTRAMUSCULAR | Status: AC
  Filled 2023-12-01: qty 2

## 2023-12-01 MED ORDER — LEVETIRACETAM 100 MG/ML PO SOLN
500.0000 mg | Freq: Two times a day (BID) | ORAL | Status: DC
  Administered 2023-12-01 – 2023-12-19 (×36): 500 mg
  Filled 2023-12-01 (×37): qty 5

## 2023-12-01 MED ORDER — POLYETHYLENE GLYCOL 3350 17 G PO PACK: 17.0000 g | PACK | Freq: Every day | ORAL | Status: DC | PRN

## 2023-12-01 MED ORDER — CHLORHEXIDINE GLUCONATE CLOTH 2 % EX PADS: 6.0000 | MEDICATED_PAD | Freq: Every day | CUTANEOUS | Status: DC

## 2023-12-01 MED ORDER — METOCLOPRAMIDE HCL 5 MG/ML IJ SOLN
5.0000 mg | Freq: Once | INTRAMUSCULAR | Status: AC
  Administered 2023-12-01: 5 mg via INTRAVENOUS

## 2023-12-01 MED ORDER — DOCUSATE SODIUM 50 MG/5ML PO LIQD: 100.0000 mg | Freq: Two times a day (BID) | ORAL | Status: DC | PRN

## 2023-12-01 NOTE — Progress Notes (Signed)
 Nutrition Follow-up  DOCUMENTATION CODES:  Not applicable  INTERVENTION:  Exchange OGT for cortrak, once in place recommend restarting tube feeds: Vital 1.5 at 45 ml/h (1080 ml per day) Provides 1620 kcal, 73 gm protein, 825 ml free water  daily  NUTRITION DIAGNOSIS:  Inadequate oral intake related to acute illness as evidenced by NPO status. - remains applicable   GOAL:  Patient will meet greater than or equal to 90% of their needs - progressing  MONITOR:  Vent status, Labs, Weight trends, TF tolerance, I & O's  REASON FOR ASSESSMENT:  Ventilator    ASSESSMENT:  Pt  presented 1/23 with confusion and worsening L chest pain secondary to COVID and RSV infection. PMH significant for ESRD on HD, CHF, CVA with residual L sided weakness, T2DM, R BKA, seizure disorder.  1/23: MRI brain- revealed 1.3 cm acute to early subacute R PICA distribution infarct 1/28: MBS recommend dysphagia 2, thin liquids 1/30: PEA arrest; intubated; NPO  2/4: MRI brain- resolution of small R PICA cerebellar infarct, new/acute central L cerebellar infarct with no associated hemorrhage    Patient is currently intubated on ventilator support. No family at bedside at this time. Pt had TF held after emesis overnight due to coughing and gagging on ETT. Recurrent issue when sedation is weaned. Discussed with attending, will exchange OGT for cortrak and attempt post-pyloric placement. OGT hooked to suction this AM and reviewed contents in canister, does not appear to be tube feeds.   Noted ongoing GOC discussions as pt is having a hard time weaning from vent.   MV: 6 L/min Temp (24hrs), Avg:99.6 F (37.6 C), Min:98.8 F (37.1 C), Max:100.7 F (38.2 C) MAP(art line)  Admit weight: 79.5 kg   Current weight: 70.6 kg EDW prior to admission: 75.5 kg (nephrology notes several drops in dry wt over the last 3 weeks)   Intake/Output Summary (Last 24 hours) at 12/01/2023 0919 Last data filed at 12/01/2023  0800 Gross per 24 hour  Intake 1054.06 ml  Output 305 ml  Net 749.06 ml  Net IO Since Admission: 1,325.64 mL [12/01/23 0919]  Drains/Lines: OGT out x 24 hours Art Line CVC triple lumen Right AV fistula  Average Meal Intake: 1/29: 18% intake x 2 recorded meals  Nutritionally Relevant Medications: Scheduled Meds:  famotidine   10 mg Per Tube Daily   insulin  aspart  0-15 Units Subcutaneous Q4H   multivitamin  1 tablet Per Tube QHS   polyethylene glycol  17 g Per Tube BID   rosuvastatin   10 mg Per Tube Daily   senna-docusate  1 tablet Per Tube BID   Continuous Infusions:  feeding supplement (VITAL 1.5 CAL) Stopped (12/01/23 0345)   PRN Meds: bisacodyl , ondansetron , SMOG  Labs Reviewed: Na 134, chloride 96 Creatinine 3.57 Phosphorus 2.3 CBG ranges from 105-177 mg/dL over the last 24 hours HgbA1c 5.8%  NUTRITION - FOCUSED PHYSICAL EXAM: Flowsheet Row Most Recent Value  Orbital Region No depletion  Upper Arm Region No depletion  Thoracic and Lumbar Region Unable to assess  Buccal Region Unable to assess  Temple Region No depletion  Clavicle Bone Region Mild depletion  Clavicle and Acromion Bone Region No depletion  Scapular Bone Region Mild depletion  Dorsal Hand Unable to assess  Patellar Region Moderate depletion  Anterior Thigh Region Moderate depletion  [R BKA]  Posterior Calf Region Moderate depletion  Edema (RD Assessment) None  Hair Reviewed  Eyes Reviewed  Mouth Unable to assess  Skin Reviewed  Nails Unable  to assess    Diet Order:   Diet Order             Diet NPO time specified  Diet effective now                   EDUCATION NEEDS:  No education needs have been identified at this time  Skin:  Skin Assessment: Reviewed RN Assessment  Last BM:  2/7 - type 5  Height:  Ht Readings from Last 1 Encounters:  11/28/23 5' 3 (1.6 m)    Weight:  Wt Readings from Last 1 Encounters:  12/01/23 70.6 kg    Ideal Body Weight:  52.3  kg  BMI:  Body mass index is 27.57 kg/m.  Estimated Nutritional Needs:  Kcal:  1600-1800 kcal/d Protein:  80-100 g/d Fluid:  1L + UOP    Vernell Lukes, RD, LDN Registered Dietitian II Please reach out via secure chat Weekend on-call pager # available in [facility]

## 2023-12-01 NOTE — Procedures (Signed)
 Cortrak  Person Inserting Tube:  Carolyn Russo PARAS, RD Tube Type:  Cortrak - 55 inches Tube Size:  10 Tube Location:  Left nare Secured by: Bridle Technique Used to Measure Tube Placement:  Marking at nare/corner of mouth Cortrak Secured At:  64 cm   Cortrak Tube Team Note:  Consult received to place a Cortrak feeding tube. Post-pyloric placement attempted by 2 Cortrak RDs but unsuccessful. RN and unit RD aware.  X-ray is required, abdominal x-ray has been ordered by the Cortrak team. Please confirm tube placement before using the Cortrak tube.   If the tube becomes dislodged please keep the tube and contact the Cortrak team at [email or web address] for replacement.  If after hours and replacement cannot be delayed, place a NG tube and confirm placement with an abdominal x-ray.    Carolyn Russo [name], MS, RD, LDN Registered Dietitian II Please see AMiON for contact information.

## 2023-12-01 NOTE — Progress Notes (Signed)
 Patient coughing intermittently throughout shift. During a coughing episode, coffee ground output noticed from the OGT. Trickle feeds stopped at this time. OGT to low intermittent suction with 200 mL of coffee ground output. Ogan MD notified.   Lucie Horning RN

## 2023-12-01 NOTE — Progress Notes (Signed)
 STROKE TEAM PROGRESS NOTE   SUBJECTIVE (INTERVAL HISTORY) RN and cortrak team are at the bedside.  Patient still intubated, pending cortrak placement. Still on precedex  with fentanyl  bolus. Movement on pain stimulation fluctuate per RN.    OBJECTIVE Temp:  [98.8 F (37.1 C)-100.7 F (38.2 C)] 99.2 F (37.3 C) (02/07 1155) Pulse Rate:  [59-76] 67 (02/07 1615) Cardiac Rhythm: Normal sinus rhythm (02/07 1600) Resp:  [4-27] 22 (02/07 1615) BP: (123-132)/(47-57) 130/53 (02/07 0000) SpO2:  [94 %-100 %] 100 % (02/07 1615) Arterial Line BP: (127-196)/(40-74) 164/40 (02/07 1615) FiO2 (%):  [30 %] 30 % (02/07 1541) Weight:  [70.6 kg] 70.6 kg (02/07 0500)  Recent Labs  Lab 11/30/23 2346 12/01/23 0312 12/01/23 0811 12/01/23 1151 12/01/23 1554  GLUCAP 176* 115* 175* 129* 99   Recent Labs  Lab 11/27/23 0516 11/28/23 0442 11/29/23 0511 11/30/23 0401 12/01/23 0302  NA 133* 134* 131* 131* 134*  K 4.1 4.8 4.0 4.5 4.2  CL 97* 94* 92* 93* 96*  CO2 25 25 28 26 28   GLUCOSE 133* 180* 151* 193* 130*  BUN 26* 36* 25* 33* 17  CREATININE 5.00* 6.19* 4.24* 5.21* 3.57*  CALCIUM  8.4* 8.4* 8.6* 8.4* 8.6*  MG 1.7 2.4 2.3 2.2 2.2  PHOS 2.6 2.3* 2.2* 2.7 2.3*   Recent Labs  Lab 11/26/23 0200 11/29/23 0511 11/30/23 0401 12/01/23 0302  ALBUMIN  2.6* 2.1* 1.9* 2.0*   Recent Labs  Lab 11/26/23 0200 11/27/23 0516 11/28/23 0442 11/29/23 0511 11/30/23 0401 12/01/23 0302  WBC 22.0* 21.3* 21.0* 20.1* 16.8* 19.2*  NEUTROABS 18.5*  --   --   --   --   --   HGB 10.5* 9.5* 9.2* 8.7* 8.0* 7.8*  HCT 30.7* 27.3* 26.4* 25.2* 23.0* 22.8*  MCV 87.2 85.8 85.7 86.3 85.5 86.7  PLT 218 225 268 303 338 391   No results for input(s): CKTOTAL, CKMB, CKMBINDEX, TROPONINI in the last 168 hours. No results for input(s): LABPROT, INR in the last 72 hours. Recent Labs    12/01/23 0039  COLORURINE AMBER*  LABSPEC 1.020  PHURINE 6.5  GLUCOSEU 100*  HGBUR LARGE*  BILIRUBINUR NEGATIVE   KETONESUR NEGATIVE  PROTEINUR >300*  NITRITE POSITIVE*  LEUKOCYTESUR TRACE*       Component Value Date/Time   CHOL 90 11/17/2023 0349   CHOL 96 (L) 04/13/2022 1526   TRIG 103 11/29/2023 0511   HDL 38 (L) 11/17/2023 0349   HDL 34 (L) 04/13/2022 1526   CHOLHDL 2.4 11/17/2023 0349   VLDL 15 11/17/2023 0349   LDLCALC 37 11/17/2023 0349   LDLCALC 39 04/13/2022 1526   Lab Results  Component Value Date   HGBA1C 5.8 (H) 10/26/2023   No results found for: LABOPIA, COCAINSCRNUR, LABBENZ, AMPHETMU, THCU, LABBARB  No results for input(s): ETH in the last 168 hours.  I have personally reviewed the radiological images below and agree with the radiology interpretations.  DG Abd Portable 1V Result Date: 12/01/2023 CLINICAL DATA:  Check feeding catheter placement EXAM: PORTABLE ABDOMEN - 1 VIEW COMPARISON:  11/23/2023 FINDINGS: Nasogastric catheter and weighted feeding catheter are noted in the distal stomach. No free air is seen. Contrast is noted within the colon. IMPRESSION: Feeding catheters are noted within the stomach. Electronically Signed   By: Oneil Devonshire M.D.   On: 12/01/2023 15:18   DG CHEST PORT 1 VIEW Result Date: 12/01/2023 CLINICAL DATA:  Fever. EXAM: PORTABLE CHEST 1 VIEW COMPARISON:  November 28, 2023. FINDINGS: Stable cardiomediastinal silhouette. Endotracheal and  nasogastric tubes are unchanged. Right internal jugular dialysis catheter is unchanged. Both lungs are clear. The visualized skeletal structures are unremarkable. IMPRESSION: No active disease. Electronically Signed   By: Lynwood Landy Raddle M.D.   On: 12/01/2023 10:06   EEG adult Result Date: 11/29/2023 Shelton Arlin KIDD, MD     11/29/2023  1:33 PM Patient Name: Carolyn Russo MRN: [id number] Epilepsy Attending: Arlin KIDD Shelton Referring Physician/Provider: Judithe Rocky BROCKS, NP Date: 11/29/2023 Duration: 23.34 mins Patient history: 73yo F with ams. EEG to evaluate for seizure Level of alertness:  comatose AEDs during  EEG study: LEV, propofol  Technical aspects: This EEG study was done with scalp electrodes positioned according to the 10-20 International system of electrode placement. Electrical activity was reviewed with band pass filter of 1-70Hz , sensitivity of 7 uV/mm, display speed of 69mm/sec with a 60Hz  notched filter applied as appropriate. EEG data were recorded continuously and digitally stored.  Video monitoring was available and reviewed as appropriate. Description: EEG showed continuous generalized polymorphic sharply contoured 3 to 6 Hz theta-delta slowing.  As sedation was adjusted, sharply contoured waves with triphasic morphology at times qasi-periodic  at 1.5 to 2hz  were also noted. Hyperventilation and photic stimulation were not performed.   ABNORMALITY - Continuous slow, generalized IMPRESSION: This study is suggestive of moderate to severe diffuse encephalopathy.  No seizures were noted.  As sedation was adjusted, sharply contoured waves with triphasic morphology were noted in left frontal region.  These are most likely due to toxic-metabolic causes.  However, if concern for ictal or interictal activity persist, long-term EEG can be considered.  Arlin KIDD Shelton   MR BRAIN WO CONTRAST Result Date: 11/29/2023 CLINICAL DATA:  73 year old female with respiratory failure, right PICA infarct suspected on MRI last month with abnormal distal right vertebral artery flow void at that time. EXAM: MRI HEAD WITHOUT CONTRAST TECHNIQUE: Multiplanar, multiecho pulse sequences of the brain and surrounding structures were obtained without intravenous contrast. COMPARISON:  Head CT yesterday. Brain MRI and intracranial MRA 11/17/2023. FINDINGS: Brain: Previously seen right cerebellar tonsillar abnormal diffusion has resolved, T2 and FLAIR hyperintensity there has regressed (series 11, image 8) with no associated hemorrhage or mass effect. Contralateral new left central cerebellar, cerebellar peduncle region diffusion  restriction (series 5, image 59) with patchy T2 and FLAIR hyperintensity. Nearby chronic microhemorrhage on SWI appears unchanged (series 12, image 19). No acute hemorrhage or mass effect. Other regional brainstem T2 and FLAIR heterogeneity is chronic and stable, in association with right midbrain flow layering in degeneration. No other restricted diffusion. No midline shift, mass effect, evidence of mass lesion, ventriculomegaly, extra-axial collection or acute intracranial hemorrhage. Cervicomedullary junction and pituitary are within normal limits. Occasional chronic microhemorrhages in the supratentorial brain and midbrain are stable. Chronic encephalomalacia in the right deep gray nuclei and midbrain is stable. And confluent additional bilateral cerebral white matter T2 and FLAIR hyperintensity is stable. Small chronic cortically based infarct with encephalomalacia in the posterior right temporal lobe is stable. Vascular: Major intracranial vascular flow voids are detected including the distal right vertebral artery although diminutive. Skull and upper cervical spine: Chronic cervical spine degeneration but nonspecific abnormally decreased C6 vertebral body marrow signal is partially visible on series 9, image 12 (not included on January MRI). Other visible marrow signal appears stable from January and within normal limits. On recent chest CTA 11/16/2023 the C6 and C7 vertebral bodies appeared partially collapsed or eroded. Sinuses/Orbits: Regressed paranasal sinus fluid levels and inflammation. Stable orbits. Other: Bilateral  mastoid air cell fluid has progressed since January. And there are trace layering secretions in the visible nasopharynx. Patient is intubated. IMPRESSION: 1. Resolution of small right PICA cerebellar infarct seen in January. New and Acute central left Cerebellar Infarct with no associated hemorrhage or mass effect. 2. Otherwise stable underlying advanced chronic ischemic disease with some  Wallerian degeneration and occasional chronic microhemorrhages. 3. Abnormal but nonspecific partially visible bone marrow signal of the C6 vertebral body. Is there a known malignancy? Alternatively this might be discitis osteomyelitis related, with subtle partial erosions/collapse of the C6 and C7 vertebral bodies on January chest CTA. Dedicated cerVical spine MRI (without and with contrast preferred) may characterize further. 4. Progressed mastoid air cell effusions in the setting of intubation. Electronically Signed   By: VEAR Hurst M.D.   On: 11/29/2023 04:39   ECHOCARDIOGRAM LIMITED Result Date: 11/28/2023    ECHOCARDIOGRAM LIMITED REPORT   Patient Name:   Carolyn Russo [facility] Date of Exam: 11/28/2023 Medical Rec #:  [id number]      Height:       63.0 in Accession #:    [id number]     Weight:       156.5 lb Date of Birth:  05/18/1951      BSA:          1.742 m Patient Age:    73 years       BP:           159/50 mmHg Patient Gender: F              HR:           84 bpm. Exam Location:  Inpatient Procedure: Limited Echo, Cardiac Doppler and Limited Color Doppler Indications:    Cardiac Arrest I46.9  History:        Patient has prior history of Echocardiogram examinations, most                 recent 11/17/2023.  Sonographer:    Tinnie Gosling RDCS Referring Phys: [id number] TIFFANY Greentree IMPRESSIONS  1. There is prominent infrerior wall pseudodyskinesis due to increased infradiaphragmatic pressure (distended stomach?). Left ventricular ejection fraction, by estimation, is 55 to 60%. The left ventricle has normal function. There is mild concentric left ventricular hypertrophy.  2. Right ventricular systolic function is normal. The right ventricular size is normal. Tricuspid regurgitation signal is inadequate for assessing PA pressure.  3. Left atrial size was mildly dilated.  4. The mitral valve is normal in structure. Trivial mitral valve regurgitation. No evidence of mitral stenosis.  5. The aortic valve is tricuspid. There  is mild thickening of the aortic valve. Aortic valve regurgitation is not visualized. Aortic valve sclerosis is present, with no evidence of aortic valve stenosis.  6. The inferior vena cava is dilated in size with <50% respiratory variability, suggesting right atrial pressure of 15 mmHg. Comparison(s): Prior images reviewed side by side. There is inferior wall pseudodyskinesis, but no new true wall motion abnormality. FINDINGS  Left Ventricle: There is prominent infrerior wall pseudodyskinesis due to increased infradiaphragmatic pressure (distended stomach?). Left ventricular ejection fraction, by estimation, is 55 to 60%. The left ventricle has normal function. The left ventricular internal cavity size was normal in size. There is mild concentric left ventricular hypertrophy. Right Ventricle: The right ventricular size is normal. Right ventricular systolic function is normal. Tricuspid regurgitation signal is inadequate for assessing PA pressure. Left Atrium: Left atrial size was mildly dilated. Right Atrium: Right atrial size was  normal in size. Pericardium: There is no evidence of pericardial effusion. Mitral Valve: The mitral valve is normal in structure. Trivial mitral valve regurgitation. No evidence of mitral valve stenosis. Aortic Valve: The aortic valve is tricuspid. There is mild thickening of the aortic valve. Aortic valve regurgitation is not visualized. Aortic valve sclerosis is present, with no evidence of aortic valve stenosis. Venous: IVC assessment for right atrial pressure unable to be performed due to mechanical ventilation. The inferior vena cava is dilated in size with less than 50% respiratory variability, suggesting right atrial pressure of 15 mmHg. LEFT VENTRICLE PLAX 2D LVIDd:         4.60 cm LVIDs:         2.90 cm LV PW:         1.30 cm LV IVS:        1.30 cm  IVC IVC diam: 2.40 cm Mihai Croitoru MD Electronically signed by Jerel Balding MD Signature Date/Time: 11/28/2023/3:57:52 PM     Final    CT HEAD WO CONTRAST ( ) Result Date: 11/28/2023 CLINICAL DATA:  Anoxic brain injury EXAM: CT HEAD WITHOUT CONTRAST TECHNIQUE: Contiguous axial images were obtained from the base of the skull through the vertex without intravenous contrast. RADIATION DOSE REDUCTION: This exam was performed according to the departmental dose-optimization program which includes automated exposure control, adjustment of the mA and/or kV according to patient size and/or use of iterative reconstruction technique. COMPARISON:  11/16/2023 FINDINGS: Brain: No evidence of acute infarction, hemorrhage, mass, mass effect, or midline shift. No hydrocephalus or extra-axial fluid collection. Gray-white differentiation is preserved. The basal cisterns are patent. Periventricular white matter changes, likely the sequela of chronic small vessel ischemic disease. Remote infarcts in the right basal ganglia, right thalamus, and left cerebellum Vascular: No hyperdense vessel. Skull: Negative for fracture or focal lesion. Sinuses/Orbits: Mucosal thickening in the ethmoid air cells and right maxillary sinus. Sequela prior endoscopic sinus surgery. No acute finding in the orbits. Status post bilateral lens replacements. Other: Fluid in the right-greater-than-left mastoid air cells, which may be related to intubation. IMPRESSION: No acute intracranial process. Electronically Signed   By: Donald Campion M.D.   On: 11/28/2023 13:46   DG CHEST PORT 1 VIEW Result Date: 11/28/2023 CLINICAL DATA:  Acute hypoxic respiratory failure EXAM: PORTABLE CHEST 1 VIEW COMPARISON:  11/26/2023 FINDINGS: Endotracheal tube slightly advanced, now 2 cm above the carina. Right dialysis catheter and NG tube remain in place, unchanged. Cardiomegaly with vascular congestion. Interstitial prominence slightly increase could reflect early interstitial edema. Left base atelectasis, similar prior study. No visible effusions or acute bony abnormality. IMPRESSION:  Cardiomegaly, vascular congestion. Question early interstitial edema. Left base atelectasis. Electronically Signed   By: Franky Crease M.D.   On: 11/28/2023 12:06   DG CHEST PORT 1 VIEW Result Date: 11/26/2023 CLINICAL DATA:  [id number] Sepsis [facility]) [id number] EXAM: PORTABLE CHEST 1 VIEW COMPARISON:  11/23/2023 FINDINGS: ET tube terminates 4.6 cm above the carina. Right IJ hemodialysis catheter remains positioned at the level of the right atrium. Stable heart size. Aortic atherosclerosis. Similar minor bibasilar atelectasis. No new airspace consolidations. No pleural effusion or pneumothorax. IMPRESSION: 1. ET tube terminates 4.6 cm above the carina. 2. Similar minor bibasilar atelectasis. Electronically Signed   By: Mabel Converse D.O.   On: 11/26/2023 13:52   DG Abd Portable 1V Result Date: 11/23/2023 CLINICAL DATA:  Tube placement EXAM: PORTABLE ABDOMEN - 1 VIEW limited for tube placement COMPARISON:  CT 10/24/2023 FINDINGS: Limited x-ray for tube  placement has enteric tube overlying the distal stomach. Stomach is mildly distended with fluid. Minimal bowel gas elsewhere in the visualized abdomen. There is contrast in the colon. Patient had recent swallowing study. Right femoral line also noted at the edge of the imaging field. Curvature and degenerative changes along the spine. IMPRESSION: Enteric tube with tip overlying the distal stomach.  Limited x-ray Electronically Signed   By: Ranell Bring M.D.   On: 11/23/2023 18:11   DG CHEST PORT 1 VIEW Result Date: 11/23/2023 CLINICAL DATA:  Respiratory failure and status post intubation. EXAM: PORTABLE CHEST 1 VIEW COMPARISON:  11/17/2023 FINDINGS: Endotracheal tube now present with the tip approximately 1.7 cm above the carina. Stable appearance of tunneled dialysis catheter. Pacing pad over the chest. Some improved aeration of both lungs with mild bibasilar atelectasis remaining, left greater than right. No overt pulmonary edema, significant pleural fluid or  pneumothorax. IMPRESSION: Endotracheal tube tip approximately 1.7 cm above the carina. Some improved aeration of both lungs with mild bibasilar atelectasis remaining, left greater than right. Electronically Signed   By: Marcey Moan M.D.   On: 11/23/2023 08:27   DG Swallowing Func-Speech Pathology Result Date: 11/21/2023 Table formatting from the original result was not included. Modified Barium Swallow Study Patient Details Name: Carolyn Russo MRN: [id number] Date of Birth: 25-Feb-1951 Today's Date: 11/21/2023 HPI/PMH: HPI: Patient is a 73 y.o. female with PMH: CVA in 2022 and 2023, right BKA, HTN, DM-2, HLD, ESRD on HD MWF, asthma, dysphagia, CHF, heart murmur, seizure. She presented to the hospital from [facility]F on 11/16/23 for chest pain, weakness and confusion. CT head revealed infarct in inferior right cerebellum which was new since 06/12/22. MRI brain with acute right inferomedial cerebellar ischemic infarction in the right PICA territory. Pt also diagnosed with Covid/RSV. Clinical Impression: Clinical Impression: Pt exhibits mild oropharyngeal dysphagia characterized by lingual weakness and impaired sensation. She initiates a swallow response which clears the majority of each bolus from her oral cavity, but does not clear it completely. Pt does not achieve complete BOT retraction and the oral residue progresses through the pharynx to collect in the pyriform sinuses without sensation. All liquid consistencies enters the open laryngeal vestibule, where they are eventually cleared with a cued subsequent swallow or cough (PAS 3). Residue increases with thicker residue, increasing the risk of airway invasion. Of note, pt coughed multiple times throughout the study in the absence of penetration/aspiration. Cueing the pt to immediately initiate a second swallow helped clear the oropharyngeal residue promptly. Biofeedback was used to demonstrate effectiveness of subswallows on function with pt demonstrating  understanding. Recommend continuing current diet of Dys 2 solids and thin liquids. SLP will f/u to reinforce use of compensatory strategies. DIGEST Swallow Severity Rating*  Safety: 1  Efficiency: 1  Overall Pharyngeal Swallow Severity: mild 1: mild; 2: moderate; 3: severe; 4: profound *The Dynamic Imaging Grade of Swallowing Toxicity is standardized for the head and neck cancer population, however, demonstrates promising clinical applications across populations to standardize the clinical rating of pharyngeal swallow safety and severity. Factors that may increase risk of adverse event in presence of aspiration Noe & Lianne 2021): Factors that may increase risk of adverse event in presence of aspiration Noe & Lianne 2021): Reduced cognitive function Recommendations/Plan: Swallowing Evaluation Recommendations Swallowing Evaluation Recommendations Recommendations: PO diet PO Diet Recommendation: Dysphagia 2 (Finely chopped); Thin liquids (Level 0) Liquid Administration via: Cup; Straw Medication Administration: Whole meds with puree Supervision: Staff to assist with self-feeding; Full supervision/cueing for  swallowing strategies Swallowing strategies  : Minimize environmental distractions; Slow rate; Small bites/sips; Multiple dry swallows after each bite/sip Postural changes: Position pt fully upright for meals; Stay upright 30-60 min after meals Oral care recommendations: Oral care BID (2x/day) Treatment Plan Treatment Plan Treatment recommendations: Therapy as outlined in treatment plan below Follow-up recommendations: Skilled nursing-short term rehab (<3 hours/day) Functional status assessment: Patient has had a recent decline in their functional status and demonstrates the ability to make significant improvements in function in a reasonable and predictable amount of time. Treatment frequency: Min 2x/week Treatment duration: 1 week Interventions: Aspiration precaution training; Compensatory techniques;  Patient/family education Recommendations Recommendations for follow up therapy are one component of a multi-disciplinary discharge planning process, led by the attending physician.  Recommendations may be updated based on patient status, additional functional criteria and insurance authorization. Assessment: Orofacial Exam: Orofacial Exam Oral Cavity: Oral Hygiene: WFL Oral Cavity - Dentition: Adequate natural dentition Orofacial Anatomy: WFL Oral Motor/Sensory Function: WFL Anatomy: Anatomy: Suspected cervical osteophytes Boluses Administered: Boluses Administered Boluses Administered: Thin liquids (Level 0); Mildly thick liquids (Level 2, nectar thick); Moderately thick liquids (Level 3, honey thick); Puree; Solid  Oral Impairment Domain: Oral Impairment Domain Lip Closure: No labial escape Tongue control during bolus hold: Posterior escape of greater than half of bolus Bolus preparation/mastication: Timely and efficient chewing and mashing Bolus transport/lingual motion: Brisk tongue motion Oral residue: Residue collection on oral structures Location of oral residue : Tongue Initiation of pharyngeal swallow : Pyriform sinuses  Pharyngeal Impairment Domain: Pharyngeal Impairment Domain Soft palate elevation: No bolus between soft palate (SP)/pharyngeal wall (PW) Laryngeal elevation: Complete superior movement of thyroid  cartilage with complete approximation of arytenoids to epiglottic petiole Anterior hyoid excursion: Partial anterior movement Epiglottic movement: Complete inversion Laryngeal vestibule closure: Complete, no air/contrast in laryngeal vestibule Pharyngeal stripping wave : Present - complete Pharyngeal contraction (A/P view only): N/A Pharyngoesophageal segment opening: Complete distension and complete duration, no obstruction of flow Tongue base retraction: Narrow column of contrast or air between tongue base and PPW Pharyngeal residue: Collection of residue within or on pharyngeal structures  Location of pharyngeal residue: Valleculae; Aryepiglottic folds; Pyriform sinuses  Esophageal Impairment Domain: No data recorded Pill: No data recorded Penetration/Aspiration Scale Score: Penetration/Aspiration Scale Score 1.  Material does not enter airway: Solid 2.  Material enters airway, remains ABOVE vocal cords then ejected out: Moderately thick liquids (Level 3, honey thick); Puree 3.  Material enters airway, remains ABOVE vocal cords and not ejected out: Thin liquids (Level 0); Mildly thick liquids (Level 2, nectar thick) Compensatory Strategies: Compensatory Strategies Compensatory strategies: Yes Straw: Effective Effective Straw: Thin liquid (Level 0); Mildly thick liquid (Level 2, nectar thick) Chin tuck: Effective Effective Chin Tuck: Thin liquid (Level 0)   General Information: Caregiver present: No  Diet Prior to this Study: Thin liquids (Level 0); Dysphagia 2 (finely chopped)   Temperature : Normal   Respiratory Status: WFL   Supplemental O2: Nasal cannula   History of Recent Intubation: No  Behavior/Cognition: Alert; Cooperative; Pleasant mood; Confused Self-Feeding Abilities: Needs assist with self-feeding Baseline vocal quality/speech: Normal Volitional Cough: Able to elicit Volitional Swallow: Able to elicit Exam Limitations: No limitations Goal Planning: Prognosis for improved oropharyngeal function: Good Barriers to Reach Goals: Cognitive deficits; Time post onset No data recorded Patient/Family Stated Goal: none stated Consulted and agree with results and recommendations: Patient Pain: Pain Assessment Pain Assessment: Faces Faces Pain Scale: 2 Pain Location: chest and back Pain Descriptors / Indicators: Aching; Discomfort  Pain Intervention(s): Monitored during session End of Session: Start Time:SLP Start Time (ACUTE ONLY): 1511 Stop Time: SLP Stop Time (ACUTE ONLY): 1541 Time Calculation:SLP Time Calculation (min) (ACUTE ONLY): 30 min Charges: SLP Evaluations $ SLP Speech Visit: 1 Visit SLP  Evaluations $MBS Swallow: 1 Procedure $Swallowing Treatment: 1 Procedure $Speech Treatment for Individual: 1 Procedure SLP visit diagnosis: SLP Visit Diagnosis: Dysphagia, oropharyngeal phase (R13.12) Past Medical History: Past Medical History: Diagnosis Date  Acute GI bleeding   Allergy   Anemia   Anterior chest wall pain   Appendicitis 1965  Asthma   Body mass index 37.0-37.9, adult   Breast pain   Cataract   both eyes  CHF (congestive heart failure) (HCC)   Cognitive change 04/20/2021  r/t cva 03/2821  Complication of anesthesia   Memory loss after general  Dehydration 2014  Deviated septum 1971  Diabetes mellitus   Dysphagia due to old stroke   easy to get strangled when eating  Dyspnea 2014  ESRD on hemodialysis (HCC)   MWF at [facility]  Extrinsic asthma   WITH ASTHMA ATTACK  Fibroid 1980  GERD (gastroesophageal reflux disease)   Heart murmur   History of migraine   History of seizure   with stroke  Hx gestational diabetes   Hyperlipidemia   Hypertension 2014  Inguinal hernia 1959  Malaise and fatigue 2014  Non-IgE mediated allergic asthma 2014  Obesity   Pelvic pain   Pregnancy, high-risk 1985  Stroke (HCC) 04/20/2021  (CVA) of right basal ganglia  Tonsillitis 1968  Uterine fibroid 1980  Visual field defect   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 Past Surgical History: Past Surgical History: Procedure Laterality Date  ABDOMINAL AORTOGRAM W/LOWER EXTREMITY N/A 02/21/2022  Procedure: ABDOMINAL AORTOGRAM W/LOWER EXTREMITY;  Surgeon: Sheree Penne Bruckner, MD;  Location: [facility] INVASIVE CV LAB;  Service: Cardiovascular;  Laterality: N/A;  AMPUTATION Right 05/18/2022  Procedure: RIGHT BELOW KNEE AMPUTATION;  Surgeon: Harden Jerona GAILS, MD;  Location: [facility] OR;  Service: Orthopedics;  Laterality: Right;  AMPUTATION TOE Right 04/15/2022  Procedure: AMPUTATION  LST TOERIGHT FOOT;  Surgeon: Silva Juliene SAUNDERS, DPM;  Location: [facility] ORS;  Service: Podiatry;  Laterality: Right;  APPENDECTOMY  1959  BASCILIC VEIN TRANSPOSITION Right 04/30/2021  Procedure:  RIGHT FIRST STAGE BASCILIC VEIN TRANSPOSITION;  Surgeon: Eliza Bruckner RAMAN, MD;  Location: [facility] OR;  Service: Vascular;  Laterality: Right;  CESAREAN SECTION  1985  COLONOSCOPY    ESOPHAGOGASTRODUODENOSCOPY (EGD) WITH PROPOFOL  N/A 04/18/2021  Procedure: ESOPHAGOGASTRODUODENOSCOPY (EGD) WITH PROPOFOL ;  Surgeon: Avram Lupita BRAVO, MD;  Location: [facility] ENDOSCOPY;  Service: Endoscopy;  Laterality: N/A;  EYE SURGERY    bilateral cataract   FLEXIBLE SIGMOIDOSCOPY N/A 04/18/2021  Procedure: FLEXIBLE SIGMOIDOSCOPY;  Surgeon: Avram Lupita BRAVO, MD;  Location: [facility] ENDOSCOPY;  Service: Endoscopy;  Laterality: N/A;  HERNIA REPAIR  1959  IR FLUORO GUIDE CV LINE RIGHT  03/18/2021  IR US  GUIDE VASC ACCESS RIGHT  03/18/2021  LEFT HEART CATH AND CORONARY ANGIOGRAPHY N/A 04/04/2017  Procedure: Left Heart Cath and Coronary Angiography;  Surgeon: Claudene Victory ORN, MD;  Location: [facility] INVASIVE CV LAB;  Service: Cardiovascular;  Laterality: N/A;  MYOMECTOMY  1980, 2004, 2007  PERIPHERAL VASCULAR THROMBECTOMY  02/21/2022  Procedure: PERIPHERAL VASCULAR THROMBECTOMY;  Surgeon: Sheree Penne Bruckner, MD;  Location: [facility] INVASIVE CV LAB;  Service: Cardiovascular;;  Right Tibial  RHINOPLASTY  1971  ROTATOR CUFF REPAIR  2003  SURGICAL REPAIR OF HEMORRHAGE  2015  TONSILLECTOMY  1968 Damien Blumenthal, M.A., CF-SLP Speech Language Pathology,  Acute Rehabilitation Services Secure Chat preferred [phone] 11/21/2023, 4:24 PM  VAS US  CAROTID Result Date: 11/20/2023 Carotid Arterial Duplex Study Patient Name:  ALIAHNA STATZER [facility]  Date of Exam:   11/19/2023 Medical Rec #: [id number]       Accession #:    [id number] Date of Birth: 03-Mar-1951       Patient Gender: F Patient Age:   41 years Exam Location:  [facility] Procedure:      VAS US  CAROTID Referring Phys: ELVAN SOR --------------------------------------------------------------------------------  Indications:       CVA. Risk Factors:      Hypertension, hyperlipidemia, Diabetes. Other Factors:     Covid and  RSV positive. ESRD, on dialysis. Right BKA. Seizure                    disorder. Limitations        Today's exam was limited due to the patient's respiratory                    variation and the patient's inability or unwillingness to                    cooperate. Comparison Study:  Prior carotid duplex done 06/04/22 indicated bilateral 1-39%                    ICA stenosis Performing Technologist: Alberta Lis RVS  Examination Guidelines: A complete evaluation includes B-mode imaging, spectral Doppler, color Doppler, and power Doppler as needed of all accessible portions of each vessel. Bilateral testing is considered an integral part of a complete examination. Limited examinations for reoccurring indications may be performed as noted.  Right Carotid Findings: +----------+--------+--------+--------+------------------+--------+           PSV cm/sEDV cm/sStenosisPlaque DescriptionComments +----------+--------+--------+--------+------------------+--------+ CCA Distal77      14                                         +----------+--------+--------+--------+------------------+--------+ ECA       75      10                                         +----------+--------+--------+--------+------------------+--------+ Unable to adequately visualize vessels secondary to noncooperative patient Left Carotid Findings: +----------+--------+--------+--------+------------------+------------------+           PSV cm/sEDV cm/sStenosisPlaque DescriptionComments           +----------+--------+--------+--------+------------------+------------------+ CCA Prox  97      11                                intimal thickening +----------+--------+--------+--------+------------------+------------------+ CCA Distal67      11                                intimal thickening +----------+--------+--------+--------+------------------+------------------+ ICA Prox  99      19              heterogenous                          +----------+--------+--------+--------+------------------+------------------+ ICA Mid   96  22                                                   +----------+--------+--------+--------+------------------+------------------+ ICA Distal78      19                                                   +----------+--------+--------+--------+------------------+------------------+ ECA       83      9                                                    +----------+--------+--------+--------+------------------+------------------+ +----------+--------+--------+--------+-------------------+           PSV cm/sEDV cm/sDescribeArm Pressure (mmHG) +----------+--------+--------+--------+-------------------+ [id number]                                         +----------+--------+--------+--------+-------------------+ +---------+--------+--+--------+--+ VertebralPSV cm/s66EDV cm/s10 +---------+--------+--+--------+--+   Summary: Right Carotid: Unable to assess. Left Carotid: Velocities in the left ICA are consistent with a 1-39% stenosis. Vertebrals:  Left vertebral artery demonstrates antegrade flow. Subclavians: Normal flow hemodynamics were seen in the left subclavian artery. *See table(s) above for measurements and observations.  Electronically signed by Eather Popp MD on 11/20/2023 at 2:01:25 PM.    Final    ECHOCARDIOGRAM LIMITED Result Date: 11/17/2023    ECHOCARDIOGRAM LIMITED REPORT   Patient Name:   Carolyn Russo [facility] Date of Exam: 11/17/2023 Medical Rec #:  [id number]      Height:       63.0 in Accession #:    [id number]     Weight:       175.3 lb Date of Birth:  [birth year]      BSA:          1.828 m Patient Age:    73 years       BP:           154/88 mmHg Patient Gender: F              HR:           93 bpm. Exam Location:  Inpatient Procedure: Limited Echo, Limited Color Doppler and Cardiac Doppler Indications:    stroke  History:        Patient has prior history  of Echocardiogram examinations, most                 recent 10/25/2023. CAD, end stage renal disease; Risk                 Factors:Hypertension.  Sonographer:    Tinnie Barefoot RDCS Referring Phys: [id number] [facility] GOEL IMPRESSIONS  1. Left ventricular ejection fraction, by estimation, is 55 to 60%. The left ventricle has normal function. The left ventricle has no regional wall motion abnormalities. Elevated left ventricular end-diastolic pressure.  2. Right ventricular systolic function is normal. The right ventricular size is normal. There is severely elevated pulmonary artery systolic pressure.  3. The mitral valve is normal in structure.  Trivial mitral valve regurgitation. No evidence of mitral stenosis.  4. The aortic valve is tricuspid. There is mild calcification of the aortic valve. Aortic valve regurgitation is not visualized. No aortic stenosis is present. Aortic valve mean gradient measures 11.0 mmHg. Aortic valve Vmax measures 2.16 m/s.  5. The inferior vena cava is dilated in size with <50% respiratory variability, suggesting right atrial pressure of 15 mmHg. FINDINGS  Left Ventricle: Left ventricular ejection fraction, by estimation, is 55 to 60%. The left ventricle has normal function. The left ventricle has no regional wall motion abnormalities. The left ventricular internal cavity size was normal in size. There is  no left ventricular hypertrophy. Elevated left ventricular end-diastolic pressure. Right Ventricle: The right ventricular size is normal. No increase in right ventricular wall thickness. Right ventricular systolic function is normal. There is severely elevated pulmonary artery systolic pressure. The tricuspid regurgitant velocity is 3.47 m/s, and with an assumed right atrial pressure of 15 mmHg, the estimated right ventricular systolic pressure is 63.2 mmHg. Left Atrium: Left atrial size was normal in size. Right Atrium: Right atrial size was normal in size. Pericardium: There is no  evidence of pericardial effusion. Mitral Valve: The mitral valve is normal in structure. Mild mitral annular calcification. Trivial mitral valve regurgitation. No evidence of mitral valve stenosis. Tricuspid Valve: The tricuspid valve is normal in structure. Tricuspid valve regurgitation is mild . No evidence of tricuspid stenosis. Aortic Valve: The aortic valve is tricuspid. There is mild calcification of the aortic valve. Aortic valve regurgitation is not visualized. No aortic stenosis is present. Aortic valve mean gradient measures 11.0 mmHg. Aortic valve peak gradient measures 18.7 mmHg. Pulmonic Valve: The pulmonic valve was normal in structure. Pulmonic valve regurgitation is not visualized. No evidence of pulmonic stenosis. Aorta: The aortic root is normal in size and structure. Venous: The inferior vena cava is dilated in size with less than 50% respiratory variability, suggesting right atrial pressure of 15 mmHg. IAS/Shunts: No atrial level shunt detected by color flow Doppler. Additional Comments: Spectral Doppler performed. Color Doppler performed.   Diastology LV e' medial:    5.55 cm/s LV E/e' medial:  22.2 LV e' lateral:   8.92 cm/s LV E/e' lateral: 13.8  IVC IVC diam: 2.40 cm AORTIC VALVE AV Vmax:           216.00 cm/s AV Vmean:          152.000 cm/s AV VTI:            0.407 m AV Peak Grad:      18.7 mmHg AV Mean Grad:      11.0 mmHg LVOT Vmax:         110.00 cm/s LVOT Vmean:        73.700 cm/s LVOT VTI:          0.210 m LVOT/AV VTI ratio: 0.52  AORTA Ao Asc diam: 2.80 cm MITRAL VALVE                TRICUSPID VALVE MV Area (PHT): 4.80 cm     TR Peak grad:   48.2 mmHg MV Decel Time: 158 msec     TR Vmax:        347.00 cm/s MV E velocity: 123.00 cm/s MV A velocity: 119.00 cm/s  SHUNTS MV E/A ratio:  1.03         Systemic VTI: 0.21 m Annabella Scarce MD Electronically signed by Annabella Scarce MD Signature Date/Time: 11/17/2023/6:14:59 PM    Final  MR ANGIO HEAD WO CONTRAST Result Date:  11/17/2023 CLINICAL DATA:  Neuro deficit, acute, stroke suspected. Right PICA infarct and abnormal appearance of the distal right vertebral artery on MRI. EXAM: MRA HEAD WITHOUT CONTRAST TECHNIQUE: Angiographic images of the Circle of Willis were acquired using MRA technique without intravenous contrast. COMPARISON:  Head MRI 11/16/2023. Head and neck CTA 06/22/2022. Head MRA 06/03/2022. FINDINGS: The study is motion degraded, moderately so towards the skull base Anterior circulation: The internal carotid arteries are widely patent from skull base to carotid termini. ACAs and MCAs are patent without evidence of a proximal branch occlusion or significant A1 or M1 stenosis. No aneurysm is identified. Posterior circulation: The included portion of the intracranial left vertebral artery is widely patent and dominant. The included intracranial right vertebral artery is also patent but is diffusely small distally which reflects a change from the prior CTA. Patent AICA and SCA origins are visualized bilaterally. The basilar artery is patent with decreased signal through its mid to distal portions attributed to motion artifact based on source images. There are large posterior communicating arteries bilaterally. The PCAs are patent with severe bilateral P2 stenoses, likely progressed. No aneurysm is identified. Anatomic variants: None. IMPRESSION: 1. Motion degraded examination. 2. No large vessel occlusion. 3. Severe bilateral P2 stenoses. 4. Patent but diffusely narrowed distal right vertebral artery. Electronically Signed   By: Dasie Hamburg M.D.   On: 11/17/2023 17:09   EEG adult Result Date: 11/17/2023 Shelton Arlin KIDD, MD     11/17/2023 10:44 AM Patient Name: Carolyn Russo MRN: [id number] Epilepsy Attending: Arlin KIDD Shelton Referring Physician/Provider: Merrianne Locus, MD Date: 11/17/2023 Duration: 23.09 mins Patient history: 73 year old female with history of seizures who presented with altered mental status.  EEG to  evaluate for seizure. Level of alertness: Awake AEDs during EEG study: LEV Technical aspects: This EEG study was done with scalp electrodes positioned according to the 10-20 International system of electrode placement. Electrical activity was reviewed with band pass filter of 1-70Hz , sensitivity of 7 uV/mm, display speed of 30mm/sec with a 60Hz  notched filter applied as appropriate. EEG data were recorded continuously and digitally stored.  Video monitoring was available and reviewed as appropriate. Description: The posterior dominant rhythm consists of 8 Hz activity of moderate voltage (25-35 uV) seen predominantly in posterior head regions, symmetric and reactive to eye opening and eye closing. EEG showed intermittent generalized 3 to 6 Hz theta-delta slowing.spikes were noted in right temporoparietal region. Hyperventilation and photic stimulation were not performed.   Of note, study was technically difficult due to significant movement artifact. 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  Additionally there is mild diffuse encephalopathy. No seizures were seen throughout the recording. Arlin KIDD Shelton   MR BRAIN WO CONTRAST Result Date: 11/17/2023 CLINICAL DATA:  Initial evaluation for acute TIA. EXAM: MRI HEAD WITHOUT CONTRAST TECHNIQUE: Multiplanar, multiecho pulse sequences of the brain and surrounding structures were obtained without intravenous contrast. COMPARISON:  Prior CT from earlier the same day as well as prior CTA from 06/22/2022. FINDINGS: Brain: Examination degraded by motion artifact. Diffuse prominence of the CSF containing spaces compatible with atrophy, most pronounced about the frontal lobes bilaterally. Patchy and confluent T2/FLAIR hyperintensity involving the periventricular deep white matter both cerebral hemispheres, consistent  with chronic small vessel ischemic disease, moderate to advanced in nature. Patchy involvement of the pons noted. Remote lacunar infarct present  at the right basal ganglia. Associated wallerian degeneration noted. Remote lacunar infarct noted within the left cerebellum as well. A 1.3 cm focus of diffusion signal abnormality involving the inferior right cerebellum, consistent with an acute to early subacute right PICA distribution infarct. No associated hemorrhage or significant mass effect. No other evidence for acute or subacute ischemia. No acute intracranial hemorrhage. Few scattered chronic micro hemorrhages noted, likely hypertensive in nature. No mass lesion, midline shift or mass effect. Mild ventricular prominence related to global parenchymal volume loss without hydrocephalus. No extra-axial fluid collection. Pituitary gland suprasellar region within normal limits. Vascular: Loss of normal flow void within the intradural right V4 segment, which could reflect slow flow and/or occlusion. This appears to be new from prior CTA from 06/22/2022. Major intracranial vascular flow voids are otherwise maintained. Skull and upper cervical spine: Craniocervical junction within normal limits. Decreased T1 signal intensity noted within the visualized bone marrow, nonspecific, but most commonly related to anemia, smoking or obesity. No scalp soft tissue abnormality. Sinuses/Orbits: Bilateral ocular lens replacement with axial myopia. Scattered mucosal thickening noted about the paranasal sinuses. Superimposed air-fluid level within the right maxillary sinus. Small to moderate bilateral mastoid effusions. Negative nasopharynx. Other: None. IMPRESSION: 1. 1.3 cm acute to early subacute right PICA distribution infarct. No associated hemorrhage or significant mass effect. 2. Loss of normal flow void within the intradural right V4 segment, which could reflect slow flow and/or occlusion. This appears to be new from prior CTA  from 06/22/2022. 3. Underlying moderate to advanced chronic microvascular ischemic disease with remote lacunar infarcts involving the right basal ganglia and left cerebellum. 4. Underlying atrophy, most pronounced about the frontal lobes bilaterally. 5. Paranasal sinus disease with air-fluid level within the right maxillary sinus. Clinical correlation for possible acute sinusitis recommended. Electronically Signed   By: Morene Hoard M.D.   On: 11/17/2023 00:40   DG Chest Portable 1 View Result Date: 11/17/2023 CLINICAL DATA:  Altered mental status EXAM: PORTABLE CHEST 1 VIEW COMPARISON:  11/16/2023 FINDINGS: Right dialysis catheter is unchanged. Cardiomegaly with vascular congestion and mild interstitial and airspace opacities compatible with edema. No effusions or acute bony abnormality. IMPRESSION: Cardiomegaly, vascular congestion.  Mild pulmonary edema. Electronically Signed   By: Franky Crease M.D.   On: 11/17/2023 00:26   CT Angio Chest PE W and/or Wo Contrast Result Date: 11/16/2023 CLINICAL DATA:  Chest pain, Pulmonary embolism (PE) suspected, high prob EXAM: CT ANGIOGRAPHY CHEST WITH CONTRAST TECHNIQUE: Multidetector CT imaging of the chest was performed using the standard protocol during bolus administration of intravenous contrast. Multiplanar CT image reconstructions and MIPs were obtained to evaluate the vascular anatomy. RADIATION DOSE REDUCTION: This exam was performed according to the departmental dose-optimization program which includes automated exposure control, adjustment of the mA and/or kV according to patient size and/or use of iterative reconstruction technique. CONTRAST:  75mL OMNIPAQUE  IOHEXOL  350 MG/ML SOLN COMPARISON:  11/16/2023, 10/24/2023 FINDINGS: Cardiovascular: This is a technically adequate evaluation of the pulmonary vasculature. No filling defects or pulmonary emboli. The heart is enlarged without pericardial effusion. No evidence of thoracic aortic aneurysm or  dissection. Stable aortic valve calcification, aortic atherosclerosis, and atherosclerosis throughout the coronary vasculature. Right internal jugular dialysis catheter tip within the atriocaval junction. Mediastinum/Nodes: Stable right hilar adenopathy measuring up to 11 mm in short axis. Thyroid , trachea, and esophagus are unremarkable. Small hiatal hernia. Lungs/Pleura: There are dependent areas of consolidation within the lungs bilaterally, favoring hypoventilatory changes over airspace disease. Trace bilateral pleural fluid. No  pneumothorax. The central airways are patent. Upper Abdomen: No acute abnormality. Musculoskeletal: No acute or destructive bony abnormalities. Reconstructed images demonstrate no additional findings. Review of the MIP images confirms the above findings. IMPRESSION: 1. No evidence of pulmonary embolus. 2. Dependent areas of consolidation within the lungs bilaterally, favor hypoventilatory changes over airspace disease. 3. Trace bilateral pleural effusions. 4. Aortic Atherosclerosis (ICD10-I70.0). Coronary artery atherosclerosis. Electronically Signed   By: Ozell Daring M.D.   On: 11/16/2023 19:07   CT HEAD WO CONTRAST ( ) Result Date: 11/16/2023 CLINICAL DATA:  Head trauma, minor (Age >= 65y) EXAM: CT HEAD WITHOUT CONTRAST TECHNIQUE: Contiguous axial images were obtained from the base of the skull through the vertex without intravenous contrast. RADIATION DOSE REDUCTION: This exam was performed according to the departmental dose-optimization program which includes automated exposure control, adjustment of the mA and/or kV according to patient size and/or use of iterative reconstruction technique. COMPARISON:  CT head 06/22/2022. FINDINGS: Brain: Infarct in the inferior right cerebellum which is new since the 06/12/2022. No evidence of acute hemorrhage, hydrocephalus, extra-axial collection or mass lesion/mass effect. Similar patchy white matter hypodensities, nonspecific but  compatible with chronic microvascular ischemic disease. Similar remote perforator infarct in the right basal ganglia. Vascular: Calcific atherosclerosis.  No hyperdense vessel. Skull: No acute fracture. Sinuses/Orbits: Moderate right maxillary sinus mucosal thickening. No acute orbital findings. Other: No mastoid effusions. IMPRESSION: 1. Infarct in the inferior right cerebellum which is new since 06/12/2022 but otherwise age indeterminate. If there is concern for acute infarct, recommend MRI head. 2. No acute hemorrhage. 3. Similar chronic microvascular ischemic changes. Electronically Signed   By: Gilmore GORMAN Molt M.D.   On: 11/16/2023 19:06   DG Shoulder Left Result Date: 11/16/2023 CLINICAL DATA:  Left arm pain status post fall. EXAM: LEFT SHOULDER - 2+ VIEW COMPARISON:  None Available. FINDINGS: There is no evidence of acute fracture or dislocation. Mild degenerative changes of the glenohumeral and acromioclavicular joints. IMPRESSION: No acute osseous abnormality. Electronically Signed   By: [name] Sherry M.D.   On: 11/16/2023 14:54   DG Chest 1 View Result Date: 11/16/2023 CLINICAL DATA:  Chest pain. EXAM: CHEST  1 VIEW COMPARISON:  Radiographs 10/24/2023 and 02/12/2023.  CT 10/24/2023. FINDINGS: 1157 hours. Right IJ hemodialysis catheter projects over the lower right atrium. Stable cardiomegaly and vascular congestion. There is chronic scarring or atelectasis at the left lung base. No edema, confluent airspace disease, significant pleural effusion or pneumothorax. The bones appear unchanged. Telemetry leads overlie the chest. IMPRESSION: Stable cardiomegaly and vascular congestion. No evidence of acute cardiopulmonary process. Electronically Signed   By: Elsie Perone M.D.   On: 11/16/2023 12:14     PHYSICAL EXAM  Temp:  [98.8 F (37.1 C)-100.7 F (38.2 C)] 99.2 F (37.3 C) (02/07 1155) Pulse Rate:  [59-76] 67 (02/07 1615) Resp:  [4-27] 22 (02/07 1615) BP: (123-132)/(47-57) 130/53  (02/07 0000) SpO2:  [94 %-100 %] 100 % (02/07 1615) Arterial Line BP: (127-196)/(40-74) 164/40 (02/07 1615) FiO2 (%):  [30 %] 30 % (02/07 1541) Weight:  [70.6 kg] 70.6 kg (02/07 0500)  General - Well nourished, well developed, intubated on precedex  with fentanyl  bolus intermittently  Ophthalmologic - fundi not visualized due to noncooperation.  Cardiovascular - Regular rate and rhythm.  Neuro - intubated on on precedex  with fentanyl  bolus intermittently, eyes closed, not following commands. With forced eye opening, eyes in mid position, not blinking to visual threat, doll's eyes very sluggish, not tracking, papillary reflex sluggish. Corneal reflex weakly  present on the both sides, gag and cough strongly present. Breathing  over the vent.  Facial symmetry not able to test due to ET tube.  Tongue protrusion not cooperative. On pain stimulation, only slightly withdraw on the left LE. Sensation, coordination and gait not tested.     ASSESSMENT/PLAN Ms. Dandrea MARYJAYNE KLEVEN is a 73 y.o. female with history of ESRD on hemodialysis, CHF, diabetes, right BKA, stroke, seizure, obesity, hypertension and hyperlipidemia admitted for chest pain, confusion, and respiratory failure. No TNK given due to recent stroke.    Severe encephalopathy Etiology likely due to respiratory failure, ESRD, COVID/RSV infection, hypotension and anemia EEG this admission moderate to severe diffuse encephalopathy Stroke likely not the cause for current mental status Continue treat underlying disease by CCM.  Stroke:  right then left cerebellar infarct likely secondary to small vessel disease source CT 1/23 right inferior Cerebellar infarct MRI 1/24 acute to early subacute right PICA distribution infarct MRA 1/24 intradural right V4 occlusion CT 2/4 no acute finding MRI 2/5 resolution of small right PICA infarct but new and acute central left cerebellar infarct. Carotid Doppler unremarkable 2D Echo 55 to 60% LDL 37 HgbA1c  5.8 Heparin  subcu for VTE prophylaxis Aspirin  and Brilinta  prior to admission, now on aspirin  81 mg daily and Brilinta  (ticagrelor ) 90 mg bid.  Ongoing aggressive stroke risk factor management Therapy recommendations: Pending Disposition: Pending  Respiratory failure COVID and RSV infection Intubated on precedex  with fentanyl  bolus intermittently Difficulty lighting up sedation due to severe gag cough and double stacking on vent Ventilation management per CCM May need tracheostomy per CCM  Diabetes HgbA1c 5.8 goal < 7.0 Controlled CBG monitoring SSI DM education and close PCP follow up  History of seizure/stroke 03/2021 admitted for left-sided weakness.  Found to have right subcortical infarct at BG/CR.  EF 50%.  MRA head and neck unremarkable.  LDL 70, A1c 5.2.  Discharged on DAPT and Crestor  20. 04/2021 admitted for altered mental status.  CT, CTA head and neck negative.  MRI no new infarct.  EEG showed right central spikes.  Put on Keppra  500 nightly 05/2022 admitted for altered mental status.  MRI showed left CR and right parietal small infarcts.  Carotid Doppler negative, MRI head negative.  LDL 39, A1c 4.6.  Discharged on aspirin  Brilinta .  EEG showed increased amount of irritability, increase Keppra  to 750 twice daily. EEG this admission moderate to severe diffuse encephalopathy, no seizure. Continue Keppra  500 twice daily IV -> per tube.  Hx of hypertension Hypotension On pressor, Levophed  On midodrine  Long term BP goal normotensive  Fever Tmax 100.7->afebrile Leukocytosis WBC 20.1->16.8->19.2 UA neg CXR neg  Hyperlipidemia Home meds: Crestor  20 LDL 37, goal < 70 Now on Crestor  10, maximum dose given renal function Continue statin at discharge  Other Stroke Risk Factors Advanced age CHF PVD with right BKA  Other Active Problems ESRD on hemodialysis Anemia, hemoglobin 8.7->8.0  Hospital day # 15  Neurology will follow peripherally. Please feel free to call  with questions.    Ary Cummins, MD PhD Stroke Neurology 12/01/2023 5:29 PM    To contact Stroke Continuity provider, please refer to [email or web address]. After hours, contact General Neurology

## 2023-12-01 NOTE — Progress Notes (Signed)
 Nephrology Follow-Up Consult note   Assessment/Recommendations: Carolyn Russo is a/an 73 y.o. female with a past medical history significant for ESRD, admitted for AMS w/ AHRF, CAP, COVID, RSV.     Interval History/Subjective: on vent. Husband at bedside, says she has been making more purposeful movements, especially yesterday before she was put back on sedation.    Physical Exam: Vitals:   12/01/23 0945 12/01/23 1000  BP:    Pulse: 69 67  Resp: (!) 21 19  Temp:    SpO2: 99% 100%   GEN: Chronically ill-appearing, lying in bed, no distress ENT: no nasal discharge, mmm, ET tube in place EYES: no scleral icterus, eyes closed CV: normal rate, no rub PULM: Bilateral chest rise, ventilated ABD: NABS, non-distended SKIN: no rashes or jaundice EXT: minimal edema, warm and well perfused  Renal-related home meds: - rocaltrol  0.5 mwf - sensipar  30 every day - hydralazine  100 tid - losartan  50 bid - renvela  800mg  ac tid - others: brilinta , crestor , keppra , ritalin , neurontin , asa, PPI, dulera , insulin , albuterol , elavil    Dialysis Orders: MWF [facility]  4h   B350    75.5kg   2/2 bath   R fem TDC  Heparin  2600 + 1400 midrun - last HD 1/22, getting to dry wt - 3-4 drops in dry wt in last 3 wks - venofer  100 qhd thru 1/31 - mircera 150 q2, last 1/17, due 1/31    CXR 2/04 - no edema   Assessment/Plan: AHRF/ +COVID and +RSV pna - +volume excess, resolved. Abx per primary. Now on ventilator after cardiac arrest.  Per CCM.  CVA - mgmt per neurology.  AMS - multifactorial. Seems to be improving some.  ESRD - on HD MWF. Off schedule this week, next HD Sat.  Hypotension - off levo support. Po midodrine  dc'd 2/05. Per pmd.  Volume - down 5kg from edw, last CXR no edema. Keep even w/hd Anemia of esrd - Hb at goal. Last aranesp  was 150 mcg on 1/04 and Hb now is down 8-9 range --> starting darbe 150mcg SQ weekly and will check ferritin/ tsat.  Secondary hyperparathyroidism - CCa and phos in  range. Cont home binders when taking po H/o seizure d/o - on Keppra  PEA arrest- mgmt per ccm.    Myer Fret  MD  [facility] 12/01/2023, 10:58 AM  Recent Labs  Lab 11/30/23 0401 12/01/23 0302  HGB 8.0* 7.8*  ALBUMIN  1.9* 2.0*  CALCIUM  8.4* 8.6*  PHOS 2.7 2.3*  CREATININE 5.21* 3.57*  K 4.5 4.2    Inpatient medications:   stroke: early stages of recovery book   Does not apply Once   aspirin   81 mg Per Tube Daily   Chlorhexidine  Gluconate Cloth  6 each Topical Q0600   Chlorhexidine  Gluconate Cloth  6 each Topical Q0600   darbepoetin (ARANESP ) injection - DIALYSIS  150 mcg Subcutaneous Q Fri-1800   famotidine   10 mg Per Tube Daily   heparin  injection (subcutaneous)  5,000 Units Subcutaneous Q8H   insulin  aspart  0-15 Units Subcutaneous Q4H   lidocaine   1 patch Transdermal Q24H   multivitamin  1 tablet Per Tube QHS   mouth rinse  15 mL Mouth Rinse Q2H   polyethylene glycol  17 g Per Tube BID   QUEtiapine   12.5 mg Per Tube BID   rosuvastatin   10 mg Per Tube Daily   senna-docusate  1 tablet Per Tube BID   sodium chloride  flush  3 mL Intravenous Q12H   ticagrelor   90 mg Per Tube BID    dexmedetomidine  (PRECEDEX ) IV infusion 1.2 mcg/kg/hr (12/01/23 0800)   feeding supplement (VITAL 1.5 CAL) Stopped (12/01/23 0345)   levETIRAcetam  500 mg (12/01/23 0927)   acetaminophen  **OR** acetaminophen , bisacodyl , chlorpheniramine-HYDROcodone , fentaNYL  (SUBLIMAZE ) injection, heparin , labetalol , ondansetron  (ZOFRAN ) IV, mouth rinse, SMOG, witch hazel-glycerin 

## 2023-12-01 NOTE — Progress Notes (Signed)
 NAME:  Carolyn Russo, MRN:  [id number], DOB:  Sep 26, 1951, LOS: 15 ADMISSION DATE:  11/16/2023, CONSULTATION DATE:  1/30 REFERRING MD:  [facility], CHIEF COMPLAINT:  code blue   History of Present Illness:  [name] is a 73 yo female with ESRD on HD MWF, CHF, previous CVA with residual left sided weakness, T2DM, right BKA, and seizure disorder. She presented to [facility] on 1/23 with confusion, left sided chest pain, shortness of breath, and coughing. She was found to be hypoxic on room air and placed on 2 L [place]. CT chest was done without evidence of PE.  MRI revealed a 1.3cm acute to early subacute right PICA infarct. Also was admitted with pneumonia due to COVID and RSV and was started on antibiotics for likely superimposed bacterial infection.    On 1/30 early morning the patient was ~2 hrs into dialysis when her blood pressure dropped. UF goal was decreased but since the patient's level of consciousnesses decreased, she received 300 cc NS bolus. EKG changes were noted and rapid response was activated, dialysis stopped, and blood returned to the patient. Around 6:30 AM the patient was noted to be apneic without a pulse and code blue was called. Patient was noted to have PEA arrest. CPR was done for a few minutes before ROSC was achieved. She was transferred to 34M and patient was intubated due to failure to protect airway.  Pertinent  Medical History  ESRD CHF CVA T2DM Seizures  Significant Hospital Events: Including procedures, antibiotic start and stop dates in addition to other pertinent events   1/23 admit to [facility] for acute infarct + covid/rsv pneumonia 1/30 admit to ICU after patient had PEA arrest  2/1 Failed SBT due to apnea. ABG with respiratory alkalosis. RR decreased 2/2 Tolerating SBT. Remains encephalopathic 2/5 Continues to tolerate SBT during day, but remains encephalopathic/unable to protect airway if extubated   Interim History / Subjective:   Seen at bedside. She does not track  and still does not follow any commands.   Objective   Blood pressure (!) 130/53, pulse 62, temperature 98.8 F (37.1 C), temperature source Axillary, resp. rate 19, height 5' 3 (1.6 m), weight 70.6 kg, SpO2 100%.    Vent Mode: PRVC FiO2 (%):  [30 %] 30 % Set Rate:  [15 bmp] 15 bmp Vt Set:  [420 mL] 420 mL PEEP:  [5 cmH20] 5 cmH20 Pressure Support:  [12 cmH20] 12 cmH20 Plateau Pressure:  [15 cmH20-17 cmH20] 16 cmH20   Intake/Output Summary (Last 24 hours) at 12/01/2023 9390 Last data filed at 12/01/2023 0500 Gross per 24 hour  Intake 1301.54 ml  Output 205 ml  Net 1096.54 ml   Filed Weights   11/30/23 0839 11/30/23 1228 12/01/23 0500  Weight: 70.6 kg 70.6 kg 70.6 kg    Examination: General: critically ill appearing female laying in bed, encephalopathic HENT: ETT in place, pupils sluggish but reactive Lungs: Diminished to auscultation bilaterally  Cardiovascular: regular rate, rhythm. No murmurs Abdomen: soft, nontender, +BS Extremities: s/p R BKA, no LE edema Neuro: opens eyes occasionally, but unable to track or follow commands. Does not blink to threat. Moves left extremities spontaneously, not moving right side to painful stimuli  GU: deferred  Labs, tests, and imaging in the EMR in the last 24 hrs indepenendly reviewed by me. Pertinent findings below  Na 134 Cl 96 BUN/Cr c/w ESRD Mg 2.2 Phos 2.3  WBC 19.2 Hb 7.8  CBGs 105-177  Resolved Hospital Problem list  Hypokalemia Hypotension Assessment & Plan:   PEA arrest: due to hypotension, not primarily cardiac in nature  Acute encephalopathy: secondary to post-arrest, stroke, metabolic derangements, and drug induced Patient remains altered, unable to track or follow commands. Concern for cefepime  toxicity given her hx of ESRD, med was discontinued 2/4. MRI brain did show a new and acute central left cerebellar infarct.  - Appreciate neurology recs - Continue precedex , wean as able  - Continue seroquel  12.5 mg  bid for agitation  Hypertension - Hypotensive initially, levophed  and midodrine  discontinued  Pneumonia due to COVID and RSV Received empiric treatment with ceftriaxone  and azithro for possible superimposed bacterial pneumonia and also s/p course of solumedrol. Also received 6 days of cefepime  post-code- discontinued for improved resp status and possible neurotoxicity.   Ventilator dependence after PEA arrest Patient was not ever truly hypoxemic, was intubated for failure to protect airway.  - Full vent support: PEEP 5, FiO2 30% - VAP bundle, stress ulcer prophylaxis - Daily SBT - has been passing SBTs during the day with settings of 12/5 - Continue to wean sedation (mentation continues to preclude extubation) - Recommending tracheostomy as patient continues to cough, gag, and vomit on vent; family considering but not yet ready   Acute to early subacute right PICA infarct on 1/24, resolved New acute central left cerebellar infarct  - Appreciate neuro follow up  - Continue DAPT with aspirin  and brilinta  - Continue statin - Needs 30d heart monitor at discharge  Abnormal C6 signal MRI showed a nonspecific bone marrow signal of c6 vertebral body - unclear if related to a malignancy vs discitis/osteomyelitis. Will hold off on C spine imaging, as family does not want to pursue this workup and priority is mental status/neuro recovery.   Leukocytosis - remains elevated, no clear source of infection and patient is afebrile  Seizure disorder - Continue keppra  500 mg BID  ESRD on HD MWF - Dialysis per nephro, last HD yesterday was cut short by ~20 min due to hypotension  - Discontinued renvela  due to low phos levels  Anemia of chronic disease - Hb 7.8 - ESA held on 1/31 per nephro   Diabetes - Continue SSI  Goals of Care Had extensive GOC conversation with patient's son at bedside yesterday and with husband via phone. We discussed early tracheostomy to help minimize discomfort and  administration of sedation and how if patient is able recover that this would not be a permanent fixture if she is able to rehab after discharge. Husband expresses preference to reach a point to trial off of ventilator before considering tracheostomy. We discussed risk of aspiration due to neurologic status in that scenario. For now, plan to continue weaning sedation and continuing daily SBT/WUA with hope that patient's neurologic status will continue to improve    Best Practice (right click and Reselect all SmartList Selections daily)   Diet/type: tubefeeds held  DVT prophylaxis prophylactic heparin   Pressure ulcer(s): pressure ulcer assessment deferred  GI prophylaxis: H2B Lines: R subclavian HD cath, R femoral CVC, R femoral art line Foley:  N/A Code Status:  full code Last date of multidisciplinary goals of care discussion [2/7 updated husband at bedside]   Labs   CBC: Recent Labs  Lab 11/26/23 0200 11/27/23 0516 11/28/23 0442 11/29/23 0511 11/30/23 0401 12/01/23 0302  WBC 22.0* 21.3* 21.0* 20.1* 16.8* 19.2*  NEUTROABS 18.5*  --   --   --   --   --   HGB 10.5* 9.5* 9.2* 8.7* 8.0*  7.8*  HCT 30.7* 27.3* 26.4* 25.2* 23.0* 22.8*  MCV 87.2 85.8 85.7 86.3 85.5 86.7  PLT 218 225 268 303 338 391    Basic Metabolic Panel: Recent Labs  Lab 11/27/23 0516 11/28/23 0442 11/29/23 0511 11/30/23 0401 12/01/23 0302  NA 133* 134* 131* 131* 134*  K 4.1 4.8 4.0 4.5 4.2  CL 97* 94* 92* 93* 96*  CO2 25 25 28 26 28   GLUCOSE 133* 180* 151* 193* 130*  BUN 26* 36* 25* 33* 17  CREATININE 5.00* 6.19* 4.24* 5.21* 3.57*  CALCIUM  8.4* 8.4* 8.6* 8.4* 8.6*  MG 1.7 2.4 2.3 2.2 2.2  PHOS 2.6 2.3* 2.2* 2.7 2.3*   GFR: Estimated Creatinine Clearance: 12.8 mL/min (A) (by C-G formula based on SCr of 3.57 mg/dL (H)). Recent Labs  Lab 11/28/23 0442 11/29/23 0511 11/30/23 0401 12/01/23 0302  WBC 21.0* 20.1* 16.8* 19.2*    Liver Function Tests: Recent Labs  Lab 11/26/23 0200  11/29/23 0511 11/30/23 0401 12/01/23 0302  ALBUMIN  2.6* 2.1* 1.9* 2.0*   No results for input(s): LIPASE, AMYLASE in the last 168 hours. No results for input(s): AMMONIA in the last 168 hours.  ABG    Component Value Date/Time   PHART 7.515 (H) 11/25/2023 1105   PCO2ART 29.8 (L) 11/25/2023 1105   PO2ART 147 (H) 11/25/2023 1105   HCO3 24.0 11/25/2023 1105   TCO2 25 11/25/2023 1105   ACIDBASEDEF 2.0 05/31/2022 1049   O2SAT 100 11/25/2023 1105     Coagulation Profile: No results for input(s): INR, PROTIME in the last 168 hours.  Cardiac Enzymes: No results for input(s): CKTOTAL, CKMB, CKMBINDEX, TROPONINI in the last 168 hours.  HbA1C: Hemoglobin A1C  Date/Time Value Ref Range Status  12/22/2020 12:00 AM 8.6  Final  06/19/2020 12:00 AM 9.6  Final   Hgb A1c MFr Bld  Date/Time Value Ref Range Status  10/26/2023 11:30 AM 5.8 (H) 4.8 - 5.6 % Final    Comment:    (NOTE)         Prediabetes: 5.7 - 6.4         Diabetes: >6.4         Glycemic control for adults with diabetes: <7.0   02/10/2023 04:52 AM 6.7 (H) 4.8 - 5.6 % Final    Comment:    (NOTE) Pre diabetes:          5.7%-6.4%  Diabetes:              >6.4%  Glycemic control for   <7.0% adults with diabetes     CBG: Recent Labs  Lab 11/30/23 1131 11/30/23 1527 11/30/23 1936 11/30/23 2346 12/01/23 0312  GLUCAP 105* 138* 177* 176* 115*    Review of Systems:   Unable to obtain  Past Medical History:  She,  has a past medical history of Acute GI bleeding, Allergy, Anemia, Anterior chest wall pain, Appendicitis (1965), Asthma, Body mass index 37.0-37.9, adult, Breast pain, Cataract, CHF (congestive heart failure) (HCC), Cognitive change (04/20/2021), Complication of anesthesia, Dehydration (2014), Deviated septum (1971), Diabetes mellitus, Dysphagia due to old stroke, Dyspnea (2014), ESRD on hemodialysis (HCC), Extrinsic asthma, Fibroid (1980), GERD (gastroesophageal reflux disease), Heart  murmur, History of migraine, History of seizure, gestational diabetes, Hyperlipidemia, Hypertension (2014), Inguinal hernia (1959), Malaise and fatigue (2014), Non-IgE mediated allergic asthma (2014), Obesity, Pelvic pain, Pregnancy, high-risk (1985), Stroke (HCC) (04/20/2021), Tonsillitis (1968), Uterine fibroid (1980), and Visual field defect.   Surgical History:   Past Surgical History:  Procedure Laterality Date  ABDOMINAL AORTOGRAM W/LOWER EXTREMITY N/A 02/21/2022   Procedure: ABDOMINAL AORTOGRAM W/LOWER EXTREMITY;  Surgeon: Sheree Penne Bruckner, MD;  Location: [facility] INVASIVE CV LAB;  Service: Cardiovascular;  Laterality: N/A;   AMPUTATION Right 05/18/2022   Procedure: RIGHT BELOW KNEE AMPUTATION;  Surgeon: Harden Jerona GAILS, MD;  Location: [facility] OR;  Service: Orthopedics;  Laterality: Right;   AMPUTATION TOE Right 04/15/2022   Procedure: AMPUTATION  LST TOERIGHT FOOT;  Surgeon: Silva Juliene SAUNDERS, DPM;  Location: [facility] ORS;  Service: Podiatry;  Laterality: Right;   APPENDECTOMY  1959   BASCILIC VEIN TRANSPOSITION Right 04/30/2021   Procedure: RIGHT FIRST STAGE BASCILIC VEIN TRANSPOSITION;  Surgeon: Eliza Bruckner RAMAN, MD;  Location: [facility] OR;  Service: Vascular;  Laterality: Right;   CESAREAN SECTION  1985   COLONOSCOPY     ESOPHAGOGASTRODUODENOSCOPY (EGD) WITH PROPOFOL  N/A 04/18/2021   Procedure: ESOPHAGOGASTRODUODENOSCOPY (EGD) WITH PROPOFOL ;  Surgeon: Avram Lupita BRAVO, MD;  Location: [facility] ENDOSCOPY;  Service: Endoscopy;  Laterality: N/A;   EYE SURGERY     bilateral cataract    FLEXIBLE SIGMOIDOSCOPY N/A 04/18/2021   Procedure: FLEXIBLE SIGMOIDOSCOPY;  Surgeon: Avram Lupita BRAVO, MD;  Location: [facility] ENDOSCOPY;  Service: Endoscopy;  Laterality: N/A;   HERNIA REPAIR  1959   IR FLUORO GUIDE CV LINE RIGHT  03/18/2021   IR US  GUIDE VASC ACCESS RIGHT  03/18/2021   LEFT HEART CATH AND CORONARY ANGIOGRAPHY N/A 04/04/2017   Procedure: Left Heart Cath and Coronary Angiography;  Surgeon: Claudene Victory ORN, MD;   Location: [facility] INVASIVE CV LAB;  Service: Cardiovascular;  Laterality: N/A;   MYOMECTOMY  1980, 2004, 2007   PERIPHERAL VASCULAR THROMBECTOMY  02/21/2022   Procedure: PERIPHERAL VASCULAR THROMBECTOMY;  Surgeon: Sheree Penne Bruckner, MD;  Location: [facility] INVASIVE CV LAB;  Service: Cardiovascular;;  Right Tibial   RHINOPLASTY  1971   ROTATOR CUFF REPAIR  2003   SURGICAL REPAIR OF HEMORRHAGE  2015   TONSILLECTOMY  1968     Social History:   reports that she has never smoked. She has never used smokeless tobacco. She reports that she does not drink alcohol and does not use drugs.   Family History:  Her family history includes Alzheimer's disease in her father; Colon cancer in her cousin; Diabetes in her cousin and maternal aunt; Melanoma in her mother; Psoriasis in her mother. There is no history of Colon polyps, Esophageal cancer, Cancer - Colon, Liver disease, BRCA 1/2, or Breast cancer.   Allergies Allergies  Allergen Reactions   Almond Oil Anaphylaxis   Food Anaphylaxis    Peanuts - anaphylaxis     Gadolinium Derivatives Other (See Comments)    Gadolinium-Containing Contrast Media    Januvia  [Sitagliptin ] Other (See Comments)   Pork-Derived Products Other (See Comments)    Does not eat pork       Home Medications  Prior to Admission medications   Medication Sig Start Date End Date Taking? Authorizing Provider  acetaminophen  (TYLENOL ) 500 MG tablet Take 1,000 mg by mouth 2 (two) times daily as needed (leg and arm pain).   Yes [provider]  acetaminophen  (TYLENOL ) 650 MG CR tablet Take 650 mg by mouth 2 (two) times daily.   Yes [provider]  albuterol  (PROVENTIL ) (2.5 MG/3ML) 0.083% nebulizer solution Use 1 vial (2.5 mg total) by nebulization every 4 (four) hours as needed for wheezing or shortness of breath. 02/14/23 02/14/24 Yes Vann, Jessica U, DO  Amino Acids-Protein Hydrolys (PRO-STAT SUGAR FREE PO) Take 30 mLs by mouth 2 (  two) times daily.   Yes  [provider]  amitriptyline  (ELAVIL ) 25 MG tablet Take 0.5-1 tablets (12.5-25 mg total) by mouth at bedtime. Patient taking differently: Take 12.5 mg by mouth at bedtime. 09/11/23  Yes Lovorn, Megan, MD  aspirin  81 MG chewable tablet Chew 81 mg by mouth daily.   Yes [provider]  calcitRIOL  (ROCALTROL ) 0.5 MCG capsule Take 0.5 mcg by mouth See admin instructions. Only takes on Dialysis days Monday,Wednesday,Friday 07/10/23  Yes [provider]  cinacalcet  (SENSIPAR ) 30 MG tablet Take 30 mg by mouth daily.   Yes [provider]  cyclobenzaprine  (FLEXERIL ) 5 MG tablet Take 1 tablet (5 mg total) by mouth 3 (three) times daily as needed for muscle spasms. 11/01/23  Yes Vernon Ranks, MD  diclofenac  Sodium (VOLTAREN ) 1 % GEL Apply 2 g topically 4 (four) times daily. To left wrist 05/26/21  Yes Love, Sharlet RAMAN, PA-C  Glucagon, rDNA, (GLUCAGON EMERGENCY) 1 MG KIT Inject 1 mg into the vein as needed (CBG of 65mg /dL).   Yes [provider]  hydrALAZINE  (APRESOLINE ) 100 MG tablet Take 1 tablet (100 mg total) by mouth 3 (three) times daily. 11/01/23 12/01/23 Yes Vernon Ranks, MD  insulin  aspart (NOVOLOG ) 100 UNIT/ML injection Inject 0-6 Units into the skin 3 (three) times daily with meals. CBG < 70: Implement Hypoglycemia Standing Orders and refer to Hypoglycemia Standing Orders sidebar report CBG 70 - 120: 0 units CBG 121 - 150: 0 units CBG 151 - 200: 1 unit CBG 201-250: 2 units CBG 251-300: 3 units CBG 301-350: 4 units CBG 351-400: 5 units CBG > 400: Give 6 units and call MD 06/29/22  Yes Pokhrel, Laxman, MD  levETIRAcetam  (KEPPRA ) 250 MG tablet Take 1.5 tablets (375 mg total) by mouth every Monday, Wednesday, and Friday at 6 PM. 06/29/22  Yes Pokhrel, Laxman, MD  levETIRAcetam  (KEPPRA ) 750 MG tablet Take 1 tablet (750 mg total) by mouth at bedtime. 06/29/22  Yes Pokhrel, Laxman, MD  loratadine  (CLARITIN ) 10 MG tablet Take 10 mg by mouth daily.   Yes [provider]  losartan  (COZAAR ) 50 MG tablet Take 1 tablet (50 mg total) by mouth 2 (two) times daily. 11/01/23 12/01/23 Yes Pahwani, Ranks, MD  melatonin 5 MG TABS Take 5 mg by mouth at bedtime.   Yes [provider]  mometasone -formoterol  (DULERA ) 200-5 MCG/ACT AERO Inhale 2 puffs into the lungs 2 (two) times daily. 04/15/22  Yes Jarold Medici, MD  pantoprazole  (PROTONIX ) 40 MG tablet Take 1 tablet (40 mg total) by mouth daily. Patient taking differently: Take 40 mg by mouth at bedtime. 02/07/22  Yes Jarold Medici, MD  prochlorperazine  (COMPAZINE ) 10 MG tablet Take 1 tablet (10 mg total) by mouth every 8 (eight) hours as needed for nausea or vomiting. 02/15/23  Yes Danis, Victory LITTIE MOULD, MD  rosuvastatin  (CRESTOR ) 20 MG tablet Take 1 tablet (20 mg total) by mouth daily. Patient taking differently: Take 20 mg by mouth at bedtime. 08/18/21  Yes Ghumman, Ramandeep, NP  sevelamer  carbonate (RENVELA ) 0.8 g PACK packet Take 0.8 g by mouth 3 (three) times daily. 10/19/21  Yes [provider]  ticagrelor  (BRILINTA ) 90 MG TABS tablet Take 1 tablet (90 mg total) by mouth 2 (two) times daily. 06/10/22  Yes Leotis Bogus, MD  aspirin  EC 81 MG tablet Take 1 tablet (81 mg total) by mouth daily. Swallow whole. Patient not taking: Reported on 11/17/2023 06/10/22   Leotis Bogus, MD  gabapentin  (NEURONTIN ) 300 MG capsule  Take 1 capsule (300 mg total) by mouth at bedtime. Patient not taking: Reported on 11/17/2023 09/11/23   Lovorn, Megan, MD  hydrocortisone  (ANUSOL -HC) 2.5 % rectal cream Place rectally 4 (four) times daily. Patient not taking: Reported on 11/17/2023 02/14/23   Vann, Jessica U, DO  Iron  Sucrose (VENOFER  IV) Inject 50 mg into the skin once a week. 08/16/23 10/18/24  [provider]  Methoxy PEG-Epoetin  Beta (MIRCERA IJ) Inject 1 Dose into the skin every 14 (fourteen) days. 04/19/23 08/28/24  [provider]  methylphenidate  (RITALIN ) 5 MG tablet Take 1 tablet (5 mg total)  by mouth 2 (two) times daily with breakfast and lunch. Patient not taking: Reported on 11/17/2023 09/11/23   Lovorn, Megan, MD     Critical care time: 30 min

## 2023-12-01 NOTE — Progress Notes (Signed)
 Pharmacy ICU Bowel Regimen Consult Note   Current Inpatient Medications for Bowel Management:  Miralax  17g BID Senna-S 1 tablet BID  Assessment: Carolyn Russo is a 73 y.o. year old female admitted on 11/16/2023. Constipation identified as acute opioid-induced constipation . Bowel regimen assessment completed by Thersia, RN on 11/28/23. LBM on 11/24/23.  [x]  Bowel sounds present  [x]  No abdominal tenderness  [x]  Passing gas   Now having regular stools and liquid at this time. Off scheduled opiates.   Plan: Reduce to Miralax  and Docusate PRN at this time.  Pharmacy will sign off at this time. Thank you for allowing pharmacy to participate in this patient's care.  Harlene Boga, PharmD, BCPS, BCCCP Please refer to [facility] for [facility] numbers 12/01/2023,10:57 AM

## 2023-12-02 DIAGNOSIS — I10 Essential (primary) hypertension: Secondary | ICD-10-CM | POA: Diagnosis not present

## 2023-12-02 DIAGNOSIS — G9340 Encephalopathy, unspecified: Secondary | ICD-10-CM | POA: Diagnosis not present

## 2023-12-02 DIAGNOSIS — N186 End stage renal disease: Secondary | ICD-10-CM | POA: Diagnosis not present

## 2023-12-02 DIAGNOSIS — I469 Cardiac arrest, cause unspecified: Secondary | ICD-10-CM | POA: Diagnosis not present

## 2023-12-02 LAB — GLUCOSE, CAPILLARY
Glucose-Capillary: 165 mg/dL — ABNORMAL HIGH (ref 70–99)
Glucose-Capillary: 139 mg/dL — ABNORMAL HIGH (ref 70–99)
Glucose-Capillary: 177 mg/dL — ABNORMAL HIGH (ref 70–99)
Glucose-Capillary: 167 mg/dL — ABNORMAL HIGH (ref 70–99)
Glucose-Capillary: 136 mg/dL — ABNORMAL HIGH (ref 70–99)
Glucose-Capillary: 121 mg/dL — ABNORMAL HIGH (ref 70–99)

## 2023-12-02 LAB — TRIGLYCERIDES: Triglycerides: 86 mg/dL (ref <150)

## 2023-12-02 LAB — RENAL FUNCTION PANEL
Albumin: 2 g/dL — ABNORMAL LOW (ref 3.5–5.0)
Anion gap: 13 (ref 5–15)
BUN: 27 mg/dL — ABNORMAL HIGH (ref 8–23)
CO2: 24 mmol/L (ref 22–32)
Calcium: 8.7 mg/dL — ABNORMAL LOW (ref 8.9–10.3)
Chloride: 95 mmol/L — ABNORMAL LOW (ref 98–111)
Creatinine, Ser: 4.9 mg/dL — ABNORMAL HIGH (ref 0.44–1.00)
GFR, Estimated: 9 mL/min — ABNORMAL LOW (ref >60)
Glucose, Bld: 204 mg/dL — ABNORMAL HIGH (ref 70–99)
Phosphorus: 3.4 mg/dL (ref 2.5–4.6)
Potassium: 4.5 mmol/L (ref 3.5–5.1)
Sodium: 132 mmol/L — ABNORMAL LOW (ref 135–145)

## 2023-12-02 LAB — CBC
HCT: 22.3 % — ABNORMAL LOW (ref 36.0–46.0)
Hemoglobin: 7.6 g/dL — ABNORMAL LOW (ref 12.0–15.0)
MCH: 29.8 pg (ref 26.0–34.0)
MCHC: 34.1 g/dL (ref 30.0–36.0)
MCV: 87.5 fL (ref 80.0–100.0)
Platelets: 460 10*3/uL — ABNORMAL HIGH (ref 150–400)
RBC: 2.55 MIL/uL — ABNORMAL LOW (ref 3.87–5.11)
RDW: 17.7 % — ABNORMAL HIGH (ref 11.5–15.5)
WBC: 18.3 10*3/uL — ABNORMAL HIGH (ref 4.0–10.5)
nRBC: 0 % (ref 0.0–0.2)

## 2023-12-02 MED ORDER — HEPARIN SODIUM (PORCINE) 1000 UNIT/ML IJ SOLN
INTRAMUSCULAR | Status: AC
  Filled 2023-12-02: qty 4

## 2023-12-02 MED ORDER — FERROUS SULFATE 300 (60 FE) MG/5ML PO SOLN
300.0000 mg | Freq: Every day | ORAL | Status: DC
  Administered 2023-12-02 – 2023-12-19 (×18): 300 mg
  Filled 2023-12-02 (×18): qty 5

## 2023-12-02 NOTE — Progress Notes (Signed)
 Nephrology Follow-Up Consult note   Assessment/Recommendations: Carolyn Russo is a/an 73 y.o. female with a past medical history significant for ESRD, admitted for AMS w/ AHRF, CAP, COVID, RSV.     Interval History/Subjective: I/o yesterday: +484 net pressors: none (last pressors was 2/04) resp: vent, 30% fio2 sedation: precedex     Physical Exam: Vitals:   12/01/23 0945 12/01/23 1000  BP:    Pulse: 69 67  Resp: (!) 21 19  Temp:    SpO2: 99% 100%   GEN: Chronically ill-appearing, lying in bed, no distress ENT: no nasal discharge, mmm, ET tube in place EYES: no scleral icterus, eyes closed CV: normal rate, no rub PULM: Bilateral chest rise, ventilated ABD: NABS, non-distended SKIN: no rashes or jaundice EXT: minimal edema, warm and well perfused  Renal-related home meds: - rocaltrol  0.5 mwf - sensipar  30 every day - hydralazine  100 tid - losartan  50 bid - renvela  800mg  ac tid - others: brilinta , crestor , keppra , ritalin , neurontin , asa, PPI, dulera , insulin , albuterol , elavil    Dialysis Orders: MWF NW  4h   B350    75.5kg   2/2 bath   R fem TDC  Heparin  2600 + 1400 midrun - last HD 1/22, getting to dry wt - 3-4 drops in dry wt in last 3 wks - venofer  100 qhd thru 1/31 - mircera 150 q2, last 1/17, due 1/31    CXR 2/04 - no edema   Assessment/Plan: AHRF/ +COVID +RSV pna/ volume^ - remains on vent d/t #3. SP course of IV abx. On vent since 1/30.   Per CCM.  CVA - R then L cerebellar infarcts d/t small vessel disease. Per neurology.   AMS - multifactorial. Neurology following.  ESRD - on HD MWF. Off schedule this week. HD today.   Hypotension - resolved Volume - down 4kg from edw, last CXR 2/07 is clear.  Anemia of esrd - Hb at goal. Last aranesp  150 mcg 1/04. Hb is down to 8-9 range. Started weekly sq darbe 150mcg. Tsat 26% and ferritin 1638. Consult pharm for question of IV Fe.  Secondary hyperparathyroidism - CCa and phos in range. Resume binders when taking  po H/o seizure d/o - on Keppra  PEA arrest- mgmt per ccm.    Myer Fret  MD  [facility] 12/02/2023, 9:06 AM  Recent Labs  Lab 12/01/23 0302 12/02/23 0516  HGB 7.8* 7.6*  ALBUMIN  2.0* 2.0*  CALCIUM  8.6* 8.7*  PHOS 2.3* 3.4  CREATININE 3.57* 4.90*  K 4.2 4.5    Inpatient medications:   stroke: early stages of recovery book   Does not apply Once   aspirin   81 mg Per Tube Daily   Chlorhexidine  Gluconate Cloth  6 each Topical Q0600   Chlorhexidine  Gluconate Cloth  6 each Topical Q0600   Chlorhexidine  Gluconate Cloth  6 each Topical Q0600   darbepoetin (ARANESP ) injection - DIALYSIS  150 mcg Subcutaneous Q Fri-1800   famotidine   10 mg Per Tube Daily   heparin  injection (subcutaneous)  5,000 Units Subcutaneous Q8H   insulin  aspart  0-15 Units Subcutaneous Q4H   levETIRAcetam   500 mg Per Tube BID   lidocaine   1 patch Transdermal Q24H   multivitamin  1 tablet Per Tube QHS   mouth rinse  15 mL Mouth Rinse Q2H   QUEtiapine   12.5 mg Per Tube BID   rosuvastatin   10 mg Per Tube Daily   sodium chloride  flush  3 mL Intravenous Q12H   ticagrelor   90 mg Per Tube BID  dexmedetomidine  (PRECEDEX ) IV infusion 1 mcg/kg/hr (12/02/23 0205)   feeding supplement (VITAL 1.5 CAL) 20 mL/hr at 12/02/23 0100   acetaminophen  **OR** acetaminophen , chlorpheniramine-HYDROcodone , docusate, fentaNYL  (SUBLIMAZE ) injection, heparin , labetalol , ondansetron  (ZOFRAN ) IV, mouth rinse, polyethylene glycol, witch hazel-glycerin 

## 2023-12-02 NOTE — Progress Notes (Signed)
 NAME:  MARNEY TRELOAR, MRN:  [id number], DOB:  September 13, 1951, LOS: 16 ADMISSION DATE:  11/16/2023, CONSULTATION DATE:  1/30 REFERRING MD:  TRH, CHIEF COMPLAINT:  code blue   History of Present Illness:  Lasonya VEATRICE ECKSTEIN is a 73 yo female with ESRD on HD MWF, CHF, previous CVA with residual left sided weakness, T2DM, right BKA, and seizure disorder. She presented to [facility] on 1/23 with confusion, left sided chest pain, shortness of breath, and coughing. She was found to be hypoxic on room air and placed on 2 L [place]. CT chest was done without evidence of PE.  MRI revealed a 1.3cm acute to early subacute right PICA infarct. Also was admitted with pneumonia due to COVID and RSV and was started on antibiotics for likely superimposed bacterial infection.    On 1/30 early morning the patient was ~2 hrs into dialysis when her blood pressure dropped. UF goal was decreased but since the patient's level of consciousnesses decreased, she received 300 cc NS bolus. EKG changes were noted and rapid response was activated, dialysis stopped, and blood returned to the patient. Around 6:30 AM the patient was noted to be apneic without a pulse and code blue was called. Patient was noted to have PEA arrest. CPR was done for a few minutes before ROSC was achieved. She was transferred to 71M and patient was intubated due to failure to protect airway.  Pertinent  Medical History  ESRD CHF CVA T2DM Seizures  Significant Hospital Events: Including procedures, antibiotic start and stop dates in addition to other pertinent events   1/23 admit to [facility] for acute infarct + covid/rsv pneumonia 1/30 admit to ICU after patient had PEA arrest  2/1 Failed SBT due to apnea. ABG with respiratory alkalosis. RR decreased 2/2 Tolerating SBT. Remains encephalopathic 2/5 Continues to tolerate SBT during day, but remains encephalopathic/unable to protect airway if extubated   Interim History / Subjective:   No acute events overnight.   Continues on SBT's with poor weaning  Objective   Blood pressure (!) 156/62, pulse 62, temperature 99.3 F (37.4 C), temperature source Oral, resp. rate 14, height 5' 3 (1.6 m), weight 71.8 kg, SpO2 100%.    Vent Mode: PRVC FiO2 (%):  [30 %] 30 % Set Rate:  [15 bmp] 15 bmp Vt Set:  [420 mL] 420 mL PEEP:  [5 cmH20] 5 cmH20 Pressure Support:  [10 cmH20] 10 cmH20 Plateau Pressure:  [18 cmH20] 18 cmH20   Intake/Output Summary (Last 24 hours) at 12/02/2023 0850 Last data filed at 12/02/2023 0100 Gross per 24 hour  Intake 471.26 ml  Output --  Net 471.26 ml   Filed Weights   11/30/23 1228 12/01/23 0500 12/02/23 0509  Weight: 70.6 kg 70.6 kg 71.8 kg    Examination: Blood pressure (!) 156/62, pulse 62, temperature 99.3 F (37.4 C), temperature source Oral, resp. rate 14, height 5' 3 (1.6 m), weight 71.8 kg, SpO2 100%. Gen:      No acute distress. Frail, HEENT:  EOMI, sclera anicteric Neck:     No masses; no thyromegaly, ETT Lungs:    Clear to auscultation bilaterally; normal respiratory effort CV:         Regular rate and rhythm; no murmurs Abd:      + bowel sounds; soft, non-tender; no palpable masses, no distension Ext:    No edema; adequate peripheral perfusion, rt BKA Skin:      Warm and dry; no rash Neuro: Sedated, does not follow commands, coughing  on vent  Lab/imaging reviewed Significant for sodium 132, BUN/creatinine 27/4.90 WBC 18.3, hemoglobin 7.6, platelets 460 No new imaging   Resolved Hospital Problem list   Hypokalemia Hypotension Assessment & Plan:   PEA arrest: due to hypotension, not primarily cardiac in nature  Acute encephalopathy: secondary to post-arrest, stroke, metabolic derangements, and drug induced Patient remains altered, unable to track or follow commands. Concern for cefepime  toxicity given her hx of ESRD, med was discontinued 2/4. MRI brain did show a new and acute central left cerebellar infarct.  - Appreciate neurology recs.  Stroke not felt  to be significant enough to explain altered mental status - Continue precedex , wean as able  - Continue seroquel  12.5 mg bid for agitation  Hypertension - Hypotensive initially, levophed  and midodrine  discontinued  Pneumonia due to COVID and RSV Received empiric treatment with ceftriaxone  and azithro for possible superimposed bacterial pneumonia and also s/p course of solumedrol. Also received 6 days of cefepime  post-code- discontinued for improved resp status and possible neurotoxicity.   Ventilator dependence after PEA arrest Patient was not ever truly hypoxemic, was intubated for failure to protect airway.  - Full vent support: PEEP 5, FiO2 30% - VAP bundle, stress ulcer prophylaxis - Daily SBT - has been passing SBTs during the day with settings of 12/5 - Continue to wean sedation (mentation continues to preclude extubation) - Recommending tracheostomy as patient continues to cough, gag, and vomit on vent; family considering but not yet ready   Acute to early subacute right PICA infarct on 1/24, resolved New acute central left cerebellar infarct  - Appreciate neuro follow up  - Continue DAPT with aspirin  and brilinta  - Continue statin - Needs 30d heart monitor at discharge  Abnormal C6 signal MRI showed a nonspecific bone marrow signal of c6 vertebral body - unclear if related to a malignancy vs discitis/osteomyelitis. Will hold off on C spine imaging, as family does not want to pursue this workup and priority is mental status/neuro recovery.   Leukocytosis - remains elevated, no clear source of infection and patient is afebrile  Seizure disorder - Continue keppra  500 mg BID  ESRD on HD MWF - Dialysis per nephro, last HD yesterday was cut short by ~20 min due to hypotension  - Discontinued renvela  due to low phos levels  Anemia of chronic disease - Hb 7.8 - ESA held on 1/31 per nephro   Diabetes - Continue SSI  Goals of Care Had extensive GOC conversation with  patient's son at bedside yesterday and with husband via phone. We discussed early tracheostomy to help minimize discomfort and administration of sedation and how if patient is able recover that this would not be a permanent fixture if she is able to rehab after discharge. Husband expresses preference to reach a point to trial off of ventilator before considering tracheostomy. We discussed risk of aspiration due to neurologic status in that scenario. For now, plan to continue weaning sedation and continuing daily SBT/WUA with hope that patient's neurologic status will continue to improve    Best Practice (right click and Reselect all SmartList Selections daily)   Diet/type: tubefeeds held  DVT prophylaxis prophylactic heparin   Pressure ulcer(s): pressure ulcer assessment deferred  GI prophylaxis: H2B Lines: R subclavian HD cath, R femoral CVC, R femoral art line Foley:  N/A Code Status:  full code Last date of multidisciplinary goals of care discussion [2/7 updated husband at bedside]   Critical care time:    The patient is critically ill with  multiple organ system failure and requires high complexity decision making for assessment and support, frequent evaluation and titration of therapies, advanced monitoring, review of radiographic studies and interpretation of complex data.   Critical Care Time devoted to patient care services, exclusive of separately billable procedures, described in this note is 35 minutes.   Skylarr Liz MD [place] [facility] See Amion for pager  If no response to pager , please call [phone] until 7pm After 7:00 pm call Elink  [phone] 12/02/2023, 8:53 AM

## 2023-12-03 DIAGNOSIS — N186 End stage renal disease: Secondary | ICD-10-CM | POA: Diagnosis not present

## 2023-12-03 DIAGNOSIS — I469 Cardiac arrest, cause unspecified: Secondary | ICD-10-CM | POA: Diagnosis not present

## 2023-12-03 DIAGNOSIS — I10 Essential (primary) hypertension: Secondary | ICD-10-CM | POA: Diagnosis not present

## 2023-12-03 DIAGNOSIS — G9340 Encephalopathy, unspecified: Secondary | ICD-10-CM | POA: Diagnosis not present

## 2023-12-03 LAB — CBC
HCT: 21.3 % — ABNORMAL LOW (ref 36.0–46.0)
Hemoglobin: 7.2 g/dL — ABNORMAL LOW (ref 12.0–15.0)
MCH: 29.4 pg (ref 26.0–34.0)
MCHC: 33.8 g/dL (ref 30.0–36.0)
MCV: 86.9 fL (ref 80.0–100.0)
Platelets: 431 10*3/uL — ABNORMAL HIGH (ref 150–400)
RBC: 2.45 MIL/uL — ABNORMAL LOW (ref 3.87–5.11)
RDW: 17.4 % — ABNORMAL HIGH (ref 11.5–15.5)
WBC: 22.8 10*3/uL — ABNORMAL HIGH (ref 4.0–10.5)
nRBC: 0 % (ref 0.0–0.2)

## 2023-12-03 LAB — RENAL FUNCTION PANEL
Albumin: 1.9 g/dL — ABNORMAL LOW (ref 3.5–5.0)
Anion gap: 14 (ref 5–15)
BUN: 9 mg/dL (ref 8–23)
CO2: 25 mmol/L (ref 22–32)
Calcium: 8.6 mg/dL — ABNORMAL LOW (ref 8.9–10.3)
Chloride: 94 mmol/L — ABNORMAL LOW (ref 98–111)
Creatinine, Ser: 2.54 mg/dL — ABNORMAL HIGH (ref 0.44–1.00)
GFR, Estimated: 19 mL/min — ABNORMAL LOW (ref >60)
Glucose, Bld: 199 mg/dL — ABNORMAL HIGH (ref 70–99)
Phosphorus: 1.9 mg/dL — ABNORMAL LOW (ref 2.5–4.6)
Potassium: 3.9 mmol/L (ref 3.5–5.1)
Sodium: 133 mmol/L — ABNORMAL LOW (ref 135–145)

## 2023-12-03 LAB — GLUCOSE, CAPILLARY
Glucose-Capillary: 193 mg/dL — ABNORMAL HIGH (ref 70–99)
Glucose-Capillary: 205 mg/dL — ABNORMAL HIGH (ref 70–99)
Glucose-Capillary: 192 mg/dL — ABNORMAL HIGH (ref 70–99)
Glucose-Capillary: 126 mg/dL — ABNORMAL HIGH (ref 70–99)
Glucose-Capillary: 135 mg/dL — ABNORMAL HIGH (ref 70–99)

## 2023-12-03 MED ORDER — POLYETHYLENE GLYCOL 3350 17 G PO PACK: 17.0000 g | PACK | Freq: Every day | ORAL | Status: DC | PRN

## 2023-12-03 MED ORDER — ORAL CARE MOUTH RINSE
15.0000 mL | OROMUCOSAL | Status: DC
  Administered 2023-12-03 – 2023-12-28 (×136): 15 mL via OROMUCOSAL

## 2023-12-03 MED ORDER — HYDRALAZINE HCL 25 MG PO TABS
25.0000 mg | ORAL_TABLET | Freq: Three times a day (TID) | ORAL | Status: DC
  Administered 2023-12-03 – 2023-12-05 (×6): 25 mg
  Filled 2023-12-03 (×6): qty 1

## 2023-12-03 MED ORDER — CHLORHEXIDINE GLUCONATE CLOTH 2 % EX PADS
6.0000 | MEDICATED_PAD | Freq: Every day | CUTANEOUS | Status: DC
  Administered 2023-12-04 – 2023-12-05 (×2): 6 via TOPICAL

## 2023-12-03 MED ORDER — AMITRIPTYLINE HCL 25 MG PO TABS
12.5000 mg | ORAL_TABLET | Freq: Every day | ORAL | Status: DC
  Administered 2023-12-03: 12.5 mg
  Filled 2023-12-03 (×2): qty 0.5

## 2023-12-03 NOTE — Progress Notes (Addendum)
 NAME:  Carolyn Russo, MRN:  [id number], DOB:  January 20, 1951, LOS: 17 ADMISSION DATE:  11/16/2023, CONSULTATION DATE:  1/30 REFERRING MD:  TRH, CHIEF COMPLAINT:  code blue   History of Present Illness:  Carolyn Russo is a 73 yo female with ESRD on HD MWF, CHF, previous CVA with residual left sided weakness, T2DM, right BKA, and seizure disorder. She presented to [facility] on 1/23 with confusion, left sided chest pain, shortness of breath, and coughing. She was found to be hypoxic on room air and placed on 2 L [place]. CT chest was done without evidence of PE.  MRI revealed a 1.3cm acute to early subacute right PICA infarct. Also was admitted with pneumonia due to COVID and RSV and was started on antibiotics for likely superimposed bacterial infection.    On 1/30 early morning the patient was ~2 hrs into dialysis when her blood pressure dropped. UF goal was decreased but since the patient's level of consciousnesses decreased, she received 300 cc NS bolus. EKG changes were noted and rapid response was activated, dialysis stopped, and blood returned to the patient. Around 6:30 AM the patient was noted to be apneic without a pulse and code blue was called. Patient was noted to have PEA arrest. CPR was done for a few minutes before ROSC was achieved. She was transferred to 17M and patient was intubated due to failure to protect airway.  Pertinent  Medical History  ESRD CHF CVA T2DM Seizures  Significant Hospital Events: Including procedures, antibiotic start and stop dates in addition to other pertinent events   1/23 admit to [facility] for acute infarct + covid/rsv pneumonia 1/30 admit to ICU after patient had PEA arrest  2/1 Failed SBT due to apnea. ABG with respiratory alkalosis. RR decreased 2/2 Tolerating SBT. Remains encephalopathic 2/5 Continues to tolerate SBT during day, but remains encephalopathic/unable to protect airway if extubated   Interim History / Subjective:   Failed SBTs. BP is  labile  Objective   Blood pressure (!) 150/48, pulse 73, temperature 100.3 F (37.9 C), temperature source Axillary, resp. rate (!) 24, height 5' 3 (1.6 m), weight 71.8 kg, SpO2 100%.    Vent Mode: PRVC FiO2 (%):  [30 %] 30 % Set Rate:  [15 bmp] 15 bmp Vt Set:  [420 mL] 420 mL PEEP:  [5 cmH20] 5 cmH20 Pressure Support:  [8 cmH20] 8 cmH20 Plateau Pressure:  [18 cmH20] 18 cmH20   Intake/Output Summary (Last 24 hours) at 12/03/2023 0741 Last data filed at 12/03/2023 0600 Gross per 24 hour  Intake 947.35 ml  Output 0 ml  Net 947.35 ml   Filed Weights   12/01/23 0500 12/02/23 0509 12/03/23 0400  Weight: 70.6 kg 71.8 kg 71.8 kg    Examination: Gen:      No acute distress, chronically ill-appearing HEENT:  EOMI, sclera anicteric ET tube Neck:     No masses; no thyromegaly Lungs:    Clear to auscultation bilaterally; normal respiratory effort CV:         Regular rate and rhythm; no murmurs Abd:      + bowel sounds; soft, non-tender; no palpable masses, no distension Ext:    No edema; adequate peripheral perfusion, rt BKA Skin:      Warm and dry; no rash Neuro: Sedated  Lab/imaging reviewed Significant for sodium 133, BUN/creatinine 9/2.54 WBC 22.8, hemoglobin 7.2, platelets 431  Resolved Hospital Problem list   Hypokalemia Hypotension Assessment & Plan:   PEA arrest: due to hypotension,  not primarily cardiac in nature  Acute encephalopathy: secondary to post-arrest, stroke, metabolic derangements, and drug induced Patient remains altered, unable to track or follow commands. Concern for cefepime  toxicity given her hx of ESRD, med was discontinued 2/4. MRI brain did show a new and acute central left cerebellar infarct.  - Appreciate neurology recs.  Stroke not felt to be significant enough to explain altered mental status - Continue precedex , wean as able  - Continue seroquel  12.5 mg bid for agitation  Hypertension - Hypotensive initially, levophed  and midodrine   discontinued -Resume hydralazine  at a low dose  Pneumonia due to COVID and RSV Received empiric treatment with ceftriaxone  and azithro for possible superimposed bacterial pneumonia and also s/p course of solumedrol. Also received 6 days of cefepime  post-code- discontinued for improved resp status and possible neurotoxicity.   Ventilator dependence after PEA arrest Patient was not ever truly hypoxemic, was intubated for failure to protect airway.  - Full vent support: PEEP 5, FiO2 30% - VAP bundle, stress ulcer prophylaxis - Daily SBT - has been passing SBTs during the day with settings of 10/5 - Continue to wean sedation (mentation continues to preclude extubation) - Recommending tracheostomy as patient continues to cough, gag, and vomit on vent; family considering but not yet ready   Acute to early subacute right PICA infarct on 1/24, resolved New acute central left cerebellar infarct  - Appreciate neuro follow up  - Continue DAPT with aspirin  and brilinta  - Continue statin - Needs 30d heart monitor at discharge  Abnormal C6 signal MRI showed a nonspecific bone marrow signal of c6 vertebral body - unclear if related to a malignancy vs discitis/osteomyelitis. Will hold off on C spine imaging, as family does not want to pursue this workup and priority is mental status/neuro recovery.   Leukocytosis Remains elevated.  Continue monitoring and observe off antibiotics. Afebrile  Seizure disorder - Continue keppra  500 mg BID  ESRD on HD MWF -Dialysis per nephrology - Discontinued renvela  due to low phos levels  Anemia of chronic disease - Hb 7.8 - ESA held on 1/31 per nephro   Diabetes - Continue SSI  Goals of Care Had extensive GOC conversation with patient's son at bedside last week and with husband via phone. We discussed early tracheostomy to help minimize discomfort and administration of sedation and how if patient is able recover that this would not be a permanent fixture  if she is able to rehab after discharge. Husband expresses preference to reach a point to trial off of ventilator before considering tracheostomy. We discussed risk of aspiration due to neurologic status in that scenario. For now, plan to continue weaning sedation and continuing daily SBT/WUA with hope that patient's neurologic status will continue to improve   Discussed with son and husband again 2/9.  They would like to extubate her even if she is borderline on 5/5 PSV weans and mental status is not very good to give a chance at coming off the ventilator.  They are aware that chance of failure is high.  Okay with reintubation if needed and then we can consider a tracheostomy but they want to do the tracheostomy right now.  Best Practice (right click and Reselect all SmartList Selections daily)   Diet/type: tubefeeds held  DVT prophylaxis prophylactic heparin   Pressure ulcer(s): pressure ulcer assessment deferred  GI prophylaxis: H2B Lines: R subclavian HD cath, R femoral CVC, R femoral art line. Remove CVC and A line today of we can get PIV and cuff  pressure correlates Foley:  N/A Code Status:  full code Last date of multidisciplinary goals of care discussion [2/8 updated husband at bedside]   Critical care time:    The patient is critically ill with multiple organ system failure and requires high complexity decision making for assessment and support, frequent evaluation and titration of therapies, advanced monitoring, review of radiographic studies and interpretation of complex data.   Critical Care Time devoted to patient care services, exclusive of separately billable procedures, described in this note is 35 minutes.   Wahid Holley MD [place] [facility] See Amion for pager  If no response to pager , please call [phone] until 7pm After 7:00 pm call Elink  [phone] 12/03/2023, 7:41 AM       9/2.54

## 2023-12-03 NOTE — Progress Notes (Signed)
 Nephrology Follow-Up Consult note   Assessment/Recommendations: Carolyn Russo is a/an 73 y.o. female with a past medical history significant for ESRD, admitted for AMS w/ AHRF, CAP, COVID, RSV.     Interval History/Subjective: failed SBT's, BP labile.   Physical Exam: Vitals:   12/01/23 0945 12/01/23 1000  BP:    Pulse: 69 67  Resp: (!) 21 19  Temp:    SpO2: 99% 100%   GEN: Chronically ill-appearing, lying in bed, no distress ENT: no nasal discharge, mmm, ET tube in place EYES: no scleral icterus, eyes closed CV: normal rate, no rub PULM: Bilateral chest rise, ventilated ABD: NABS, non-distended SKIN: no rashes or jaundice EXT: minimal edema, warm and well perfused  Renal-related home meds: - rocaltrol  0.5 mwf - sensipar  30 every day - hydralazine  100 tid - losartan  50 bid - renvela  800mg  ac tid - others: brilinta , crestor , keppra , ritalin , neurontin , asa, PPI, dulera , insulin , albuterol , elavil    Dialysis Orders: MWF [facility]  4h   B350    75.5kg   2/2 bath   R fem TDC  Heparin  2600 + 1400 midrun - last HD 1/22, getting to dry wt - 3-4 drops in dry wt in last 3 wks - venofer  100 qhd thru 1/31 - mircera 150 q2, last 1/17, due 1/31    CXR 2/04 - no edema   Assessment/Plan: AHRF/ +COVID +RSV pna/ volume^ - remains on vent d/t #3. SP course of IV abx. On vent since 1/30.   Per CCM.  CVA - R then L cerebellar infarcts d/t small vessel disease. Per neurology.   AMS - multifactorial. Neurology following.  ESRD - on HD MWF. HD tomorrow.  Hypotension - resolved Volume - down 3-4 kg from dry wt. UF 1 L next HD. Avoid hypotension.  Anemia of esrd - Hb at goal. Last aranesp  150 mcg 1/04. Hb is down to 8-9 range. Started weekly sq darbe 150mcg. Tsat 26% and ferritin 1638. Pharm rec'd po ferrous sulfate  daily.  Secondary hyperparathyroidism - CCa and phos in range. Resume binders when taking po H/o seizure d/o - on Keppra  PEA arrest- mgmt per ccm.    Myer Fret  MD   [facility] 12/03/2023, 2:24 PM  Recent Labs  Lab 12/02/23 0516 12/03/23 0325  HGB 7.6* 7.2*  ALBUMIN  2.0* 1.9*  CALCIUM  8.7* 8.6*  PHOS 3.4 1.9*  CREATININE 4.90* 2.54*  K 4.5 3.9    Inpatient medications:   stroke: early stages of recovery book   Does not apply Once   amitriptyline   12.5 mg Per Tube Daily   aspirin   81 mg Per Tube Daily   Chlorhexidine  Gluconate Cloth  6 each Topical Q0600   Chlorhexidine  Gluconate Cloth  6 each Topical Q0600   Chlorhexidine  Gluconate Cloth  6 each Topical Q0600   darbepoetin (ARANESP ) injection - DIALYSIS  150 mcg Subcutaneous Q Fri-1800   famotidine   10 mg Per Tube Daily   ferrous sulfate   300 mg Per Tube Daily   heparin  injection (subcutaneous)  5,000 Units Subcutaneous Q8H   hydrALAZINE   25 mg Per Tube Q8H   insulin  aspart  0-15 Units Subcutaneous Q4H   levETIRAcetam   500 mg Per Tube BID   lidocaine   1 patch Transdermal Q24H   multivitamin  1 tablet Per Tube QHS   mouth rinse  15 mL Mouth Rinse Q2H   QUEtiapine   12.5 mg Per Tube BID   rosuvastatin   10 mg Per Tube Daily   sodium chloride  flush  3 mL Intravenous Q12H   ticagrelor   90 mg Per Tube BID    dexmedetomidine  (PRECEDEX ) IV infusion Stopped (12/03/23 1027)   feeding supplement (VITAL 1.5 CAL) 20 mL/hr at 12/03/23 1400   acetaminophen  **OR** acetaminophen , chlorpheniramine-HYDROcodone , docusate, fentaNYL  (SUBLIMAZE ) injection, heparin , labetalol , ondansetron  (ZOFRAN ) IV, mouth rinse, polyethylene glycol, witch hazel-glycerin 

## 2023-12-03 NOTE — Consult Note (Addendum)
 WOC Nurse Consult Note: Reason for Consult: deep tissue injury to sacral/buttocks area.  Peeling epithelium with surrounding maroon discoloration Wound type: deep tissue pressure injury Pressure Injury POA: NO Measurement:  8 cm x 12 cm maroon discoloration with 0.3 cm round areas of peeling epithelium.  Wound bed: red and moist Drainage (amount, consistency, odor) minimal serosanguinous  no odor Periwound: maroon discoloration Dressing procedure/placement/frequency: Cleanse sacral wound with VASHE Cleanser and pat dry. Apply sacral foam dressing.  Change every other day,   IS on low air loss mattress.  Will follow weekly.  Darice Cooley MSN, RN, FNP-BC CWON Wound, Ostomy, Continence Nurse Outpatient [facility] [phone] Pager [phone]

## 2023-12-03 NOTE — Procedures (Signed)
 Extubation Procedure Note  Patient Details:   Name: Carolyn Russo DOB: 1951/02/20 MRN: [id number]   Airway Documentation:    Vent end date: 12/03/23 Vent end time: 1135   Evaluation  O2 sats: stable throughout Complications: No apparent complications Patient did tolerate procedure well. Bilateral Breath Sounds: Diminished   Yes  Pt extubated w/o complications to 2 lpm. + cuff leak noted. No stridor post extubation. Pt with strong prod cough.  [name] 12/03/2023, 11:42 AM

## 2023-12-04 DIAGNOSIS — G9340 Encephalopathy, unspecified: Secondary | ICD-10-CM | POA: Diagnosis not present

## 2023-12-04 DIAGNOSIS — J9601 Acute respiratory failure with hypoxia: Secondary | ICD-10-CM | POA: Diagnosis not present

## 2023-12-04 DIAGNOSIS — U071 COVID-19: Secondary | ICD-10-CM | POA: Diagnosis not present

## 2023-12-04 LAB — GLUCOSE, CAPILLARY
Glucose-Capillary: 120 mg/dL — ABNORMAL HIGH (ref 70–99)
Glucose-Capillary: 122 mg/dL — ABNORMAL HIGH (ref 70–99)
Glucose-Capillary: 140 mg/dL — ABNORMAL HIGH (ref 70–99)
Glucose-Capillary: 148 mg/dL — ABNORMAL HIGH (ref 70–99)
Glucose-Capillary: 161 mg/dL — ABNORMAL HIGH (ref 70–99)
Glucose-Capillary: 161 mg/dL — ABNORMAL HIGH (ref 70–99)
Glucose-Capillary: 166 mg/dL — ABNORMAL HIGH (ref 70–99)

## 2023-12-04 LAB — RENAL FUNCTION PANEL
Albumin: 1.9 g/dL — ABNORMAL LOW (ref 3.5–5.0)
Anion gap: 12 (ref 5–15)
BUN: 21 mg/dL (ref 8–23)
CO2: 26 mmol/L (ref 22–32)
Calcium: 8.8 mg/dL — ABNORMAL LOW (ref 8.9–10.3)
Chloride: 95 mmol/L — ABNORMAL LOW (ref 98–111)
Creatinine, Ser: 3.98 mg/dL — ABNORMAL HIGH (ref 0.44–1.00)
GFR, Estimated: 11 mL/min — ABNORMAL LOW (ref >60)
Glucose, Bld: 143 mg/dL — ABNORMAL HIGH (ref 70–99)
Phosphorus: 2.6 mg/dL (ref 2.5–4.6)
Potassium: 4.1 mmol/L (ref 3.5–5.1)
Sodium: 133 mmol/L — ABNORMAL LOW (ref 135–145)

## 2023-12-04 LAB — BASIC METABOLIC PANEL
Anion gap: 16 — ABNORMAL HIGH (ref 5–15)
BUN: 19 mg/dL (ref 8–23)
CO2: 26 mmol/L (ref 22–32)
Calcium: 9.4 mg/dL (ref 8.9–10.3)
Chloride: 93 mmol/L — ABNORMAL LOW (ref 98–111)
Creatinine, Ser: 3.94 mg/dL — ABNORMAL HIGH (ref 0.44–1.00)
GFR, Estimated: 11 mL/min — ABNORMAL LOW (ref >60)
Glucose, Bld: 143 mg/dL — ABNORMAL HIGH (ref 70–99)
Potassium: 4.2 mmol/L (ref 3.5–5.1)
Sodium: 135 mmol/L (ref 135–145)

## 2023-12-04 LAB — CBC
HCT: 20.5 % — ABNORMAL LOW (ref 36.0–46.0)
Hemoglobin: 7 g/dL — ABNORMAL LOW (ref 12.0–15.0)
MCH: 29.9 pg (ref 26.0–34.0)
MCHC: 34.1 g/dL (ref 30.0–36.0)
MCV: 87.6 fL (ref 80.0–100.0)
Platelets: 469 10*3/uL — ABNORMAL HIGH (ref 150–400)
RBC: 2.34 MIL/uL — ABNORMAL LOW (ref 3.87–5.11)
RDW: 17.3 % — ABNORMAL HIGH (ref 11.5–15.5)
WBC: 15.8 10*3/uL — ABNORMAL HIGH (ref 4.0–10.5)
nRBC: 0 % (ref 0.0–0.2)

## 2023-12-04 LAB — PREPARE RBC (CROSSMATCH)

## 2023-12-04 LAB — HEMOGLOBIN AND HEMATOCRIT, BLOOD
HCT: 26.4 % — ABNORMAL LOW (ref 36.0–46.0)
Hemoglobin: 9.4 g/dL — ABNORMAL LOW (ref 12.0–15.0)

## 2023-12-04 MED ORDER — HEPARIN SODIUM (PORCINE) 1000 UNIT/ML DIALYSIS
1500.0000 [IU] | INTRAMUSCULAR | Status: AC | PRN
  Administered 2023-12-08: 1500 [IU] via INTRAVENOUS_CENTRAL
  Filled 2023-12-04: qty 2

## 2023-12-04 MED ORDER — BUDESONIDE 0.5 MG/2ML IN SUSP
0.5000 mg | Freq: Two times a day (BID) | RESPIRATORY_TRACT | Status: DC
  Administered 2023-12-04 – 2023-12-28 (×44): 0.5 mg via RESPIRATORY_TRACT
  Filled 2023-12-04 (×46): qty 2

## 2023-12-04 MED ORDER — ARFORMOTEROL TARTRATE 15 MCG/2ML IN NEBU: 15.0000 ug | INHALATION_SOLUTION | Freq: Two times a day (BID) | RESPIRATORY_TRACT | Status: DC

## 2023-12-04 MED ORDER — HEPARIN SODIUM (PORCINE) 1000 UNIT/ML IJ SOLN
3600.0000 [IU] | Freq: Once | INTRAMUSCULAR | Status: AC | PRN
  Administered 2023-12-04: 3600 [IU]

## 2023-12-04 MED ORDER — IPRATROPIUM-ALBUTEROL 0.5-2.5 (3) MG/3ML IN SOLN
3.0000 mL | RESPIRATORY_TRACT | Status: DC | PRN
  Administered 2023-12-11 – 2023-12-15 (×3): 3 mL via RESPIRATORY_TRACT
  Filled 2023-12-04 (×3): qty 3

## 2023-12-04 MED ORDER — AMITRIPTYLINE HCL 25 MG PO TABS
12.5000 mg | ORAL_TABLET | Freq: Every day | ORAL | Status: DC
  Administered 2023-12-04: 12.5 mg
  Filled 2023-12-04 (×2): qty 0.5

## 2023-12-04 MED ORDER — SODIUM CHLORIDE 0.9% IV SOLUTION: Freq: Once | INTRAVENOUS | Status: AC

## 2023-12-04 MED ORDER — GUAIFENESIN-DM 100-10 MG/5ML PO SYRP
5.0000 mL | ORAL_SOLUTION | ORAL | Status: DC | PRN
  Administered 2023-12-04 – 2023-12-05 (×2): 5 mL
  Filled 2023-12-04 (×2): qty 5

## 2023-12-04 MED ORDER — ANTICOAGULANT SODIUM CITRATE 4% (200MG/5ML) IV SOLN: 5.0000 mL | Status: DC | PRN

## 2023-12-04 MED ORDER — HEPARIN SODIUM (PORCINE) 1000 UNIT/ML DIALYSIS
2500.0000 [IU] | Freq: Once | INTRAMUSCULAR | Status: AC
  Filled 2023-12-04: qty 3

## 2023-12-04 MED ORDER — ARFORMOTEROL TARTRATE 15 MCG/2ML IN NEBU
15.0000 ug | INHALATION_SOLUTION | Freq: Two times a day (BID) | RESPIRATORY_TRACT | Status: DC
  Administered 2023-12-04 – 2023-12-28 (×43): 15 ug via RESPIRATORY_TRACT
  Filled 2023-12-04 (×45): qty 2

## 2023-12-04 MED ORDER — GUAIFENESIN-DM 100-10 MG/5ML PO SYRP: 5.0000 mL | ORAL_SOLUTION | ORAL | Status: DC | PRN

## 2023-12-04 MED ORDER — QUETIAPINE FUMARATE 25 MG PO TABS: 12.5000 mg | ORAL_TABLET | Freq: Every day | ORAL | Status: DC

## 2023-12-04 MED ORDER — BUDESONIDE 0.5 MG/2ML IN SUSP: 0.5000 mg | Freq: Two times a day (BID) | RESPIRATORY_TRACT | Status: DC

## 2023-12-04 MED ORDER — SODIUM CHLORIDE 3 % IN NEBU
4.0000 mL | INHALATION_SOLUTION | Freq: Two times a day (BID) | RESPIRATORY_TRACT | Status: AC
  Administered 2023-12-04 – 2023-12-06 (×6): 4 mL via RESPIRATORY_TRACT
  Filled 2023-12-04 (×6): qty 15

## 2023-12-04 MED ORDER — HEPARIN SODIUM (PORCINE) 1000 UNIT/ML IJ SOLN
INTRAMUSCULAR | Status: AC
  Administered 2023-12-04: 2500 [IU] via INTRAVENOUS_CENTRAL
  Filled 2023-12-04: qty 3

## 2023-12-04 NOTE — Procedures (Signed)
 HD Note:  Some information was entered later than the data was gathered due to patient care needs. The stated time with the data is accurate.  Received patient in bed to unit.   Alert and oriented.   Informed consent signed and in chart.   Access used: upper right chest dialysis catheter Access issues: none  Patient had frequent coughing episodes. Patient nurse notified, they notified the MD.  New orders written.  See MAR. Patient stated she could not breathe around 1100.  O2 increased to 2 l/m.  Patient stated yes when asked if she was breathing more easily after the oxygen  was increased.  TX duration: 3 hours and 15 min  Alert, without acute distress.  Total UF removed: 1300 ml  Hand-off given to patient's nurse.   Transported back to the room   Stella Bortle L. Alva Jewels, RN [facility].

## 2023-12-04 NOTE — Evaluation (Signed)
 Physical Therapy Re-Evaluation Patient Details Name: Carolyn Russo MRN: [id number] DOB: June 03, 1951 Today's Date: 12/04/2023  History of Present Illness  Pt is a 73 yo female presenting to [facility] on 11/16/23 for chest pain, weakness, and confusion. CT head revealed infarct in the inferior right cerebellum. MRI brain with acute right inferomedial cerebellar ischemic infarction in the right PICA territory. Pt also diagnosed with Covid/RSV. PEA arrest 1/30. Extubated 2/9. 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  Records indicate pt was more alert and interactive this morning however pt very lethargic and following <10% of commands during assessment this afternoon during re-evaluation. Required total assist +2 for bed mobility. Sat EOB with full support. She did raise her head and eyes with multimodal cues, sustained only briefly. Short coughing episode with secretions produced; able to suction and clear at EOB. Unable to facilitate any volitional movement of UEs during assessment today. Will continue to follow and progress as tolerated. Goals updated based on current level of function. Patient will benefit from continued inpatient follow up therapy, <3 hours/day.  SpO2 100% on 2L supplemental O2. BP 182/52 HR 85.    If plan is discharge home, recommend the following: Assistance with cooking/housework;Assist for transportation;Help with stairs or ramp for entrance;Two people to help with walking and/or transfers;Two people to help with bathing/dressing/bathroom   Can travel by private vehicle   No    Equipment Recommendations Other (comment) (defer to post acute)  Recommendations for Other Services       Functional Status Assessment Patient has had a recent decline in their functional status and demonstrates the ability to make significant  improvements in function in a reasonable and predictable amount of time.     Precautions / Restrictions Precautions Precautions: Fall Precaution Comments: Monitor O2. Rt CVC and A-line. Required Braces or Orthoses: Other Brace Other Brace: RLE prosthetic Restrictions Weight Bearing Restrictions Per Provider Order: No Other Position/Activity Restrictions: R BKA- prosthetic in room      Mobility  Bed Mobility Overal bed mobility: Needs Assistance Bed Mobility: Sit to Supine, Supine to Sit     Supine to sit: Total assist, +2 for physical assistance, +2 for safety/equipment, HOB elevated Sit to supine: Total assist, +2 for physical assistance, +2 for safety/equipment   General bed mobility comments: Required total assist to rise to EOB and return safely to supine. Used helicopter technique with bed pad to reduce friction. Multimodal cues to facilitate pt without effect.    Transfers                        Ambulation/Gait                  Stairs            Wheelchair Mobility     Tilt Bed    Modified Rankin (Stroke Patients Only)       Balance Overall balance assessment: Needs assistance Sitting-balance support: Bilateral upper extremity supported, Feet unsupported Sitting balance-Leahy Scale: Zero Sitting balance - Comments: No trunk engagment seated EOB. Pt able to raise her head with multimodal cues and look forward, not sustained more than a few seconds. Postural control: Posterior lean  Pertinent Vitals/Pain Pain Assessment Pain Assessment: CPOT Facial Expression: Relaxed, neutral Body Movements: Absence of movements Muscle Tension: Relaxed Compliance with ventilator (intubated pts.): N/A Vocalization (extubated pts.): Talking in normal tone or no sound CPOT Total: 0 Pain Intervention(s): Monitored during session, Repositioned    Home Living Family/patient expects to be discharged  to:: Skilled nursing facility ([facility]) Living Arrangements: Spouse/significant other Available Help at Discharge: Family;Available PRN/intermittently Type of Home: House Home Access: Ramped entrance       Home Layout: Multi-level;Able to live on main level with bedroom/bathroom Home Equipment: Rolling Walker (2 wheels);Cane - single point;Hospital bed;Other (comment);Shower seat;Wheelchair - manual Additional Comments: husband at home 24/7 but pt has 24/7 caregiver assist 24/7 7 days/wk    Prior Function Prior Level of Function : Needs assist             Mobility Comments: Working on standing with RW. ADLs Comments: assist with ADLs (feeding, eating, dressing)     Extremity/Trunk Assessment   Upper Extremity Assessment Upper Extremity Assessment: Defer to OT evaluation    Lower Extremity Assessment Lower Extremity Assessment: Difficult to assess due to impaired cognition RLE Deficits / Details: Hx R BKA.   ROM not fully assessed with femoral lines during re-evaluation LLE Deficits / Details: No volitional movement LLE; not following commands.    Cervical / Trunk Assessment Cervical / Trunk Assessment: Kyphotic  Communication   Communication Communication: Hearing impairment Cueing Techniques: Verbal cues;Tactile cues  Cognition Arousal: Lethargic Behavior During Therapy: Flat affect Overall Cognitive Status: No family/caregiver present to determine baseline cognitive functioning (No family/caregiver present to determine baseline)                                 General Comments: Follows commands <10% of the time.        General Comments General comments (skin integrity, edema, etc.): SpO2 100% on 2L supplemental O2. BP 182/52 HR 85.    Exercises     Assessment/Plan    PT Assessment Patient needs continued PT services  PT Problem List Decreased strength;Cardiopulmonary status limiting activity;Decreased coordination;Decreased range of  motion;Decreased cognition;Decreased activity tolerance;Decreased knowledge of use of DME;Decreased balance;Decreased mobility;Decreased safety awareness;Decreased knowledge of precautions;Obesity;Impaired tone       PT Treatment Interventions DME instruction;Therapeutic exercise;Gait training;Balance training;Functional mobility training;Therapeutic activities;Patient/family education;Cognitive remediation;Modalities;Wheelchair mobility training;Neuromuscular re-education    PT Goals (Current goals can be found in the Care Plan section)  Acute Rehab PT Goals Patient Stated Goal: return to rehab to improve mobility (Initial evaluation) PT Goal Formulation: Patient unable to participate in goal setting Time For Goal Achievement: 12/18/23 Potential to Achieve Goals: Poor    Frequency Min 1X/week     Co-evaluation               AM-PAC PT "6 Clicks" Mobility  Outcome Measure Help needed turning from your back to your side while in a flat bed without using bedrails?: Total Help needed moving from lying on your back to sitting on the side of a flat bed without using bedrails?: Total Help needed moving to and from a bed to a chair (including a wheelchair)?: Total Help needed standing up from a chair using your arms (e.g., wheelchair or bedside chair)?: Total Help needed to walk in hospital room?: Total Help needed climbing 3-5 steps with a railing? : Total 6 Click Score: 6    End of Session Equipment Utilized During Treatment: Gait belt  Activity Tolerance: Patient limited by lethargy Patient left: with call bell/phone within reach;in bed;with bed alarm set Nurse Communication: Mobility status;Need for lift equipment PT Visit Diagnosis: Muscle weakness (generalized) (M62.81);Difficulty in walking, not elsewhere classified (R26.2);Other symptoms and signs involving the nervous system (R29.898);Hemiplegia and hemiparesis Hemiplegia - Right/Left: Left    Time: 9147-8295 PT Time  Calculation (min) (ACUTE ONLY): 23 min   Charges:   PT Evaluation $PT Eval High Complexity: 1 High PT Treatments $Therapeutic Activity: 8-22 mins PT General Charges $$ ACUTE PT VISIT: 1 Visit         Jory Ng, PT, DPT [facility]  Rehabilitation Services Physical Therapist Office: [phone] Website: [email or web address]   Carolyn Russo 12/04/2023, 4:52 PM

## 2023-12-04 NOTE — TOC Progression Note (Signed)
 Transition of Care [facility]) - Progression Note    Patient Details  Name: Carolyn Russo MRN: [id number] Date of Birth: 06-15-1951  Transition of [facility]) CM/SW Contact  Juliane Och, LCSW Phone Number: 12/04/2023, 12:21 PM  Clinical Narrative:     12:21 PM Per chart review, patient has been extubated, is disoriented x2, and is on Precedex .  Expected Discharge Plan: Skilled Nursing Facility    Expected Discharge Plan and Services In-house Referral: Clinical Social Work     Living arrangements for the past 2 months: Single Family Home, Skilled Nursing Facility                                       Social Determinants of Health (SDOH) Interventions SDOH Screenings   Food Insecurity: No Food Insecurity (11/17/2023)  Housing: Low Risk  (11/17/2023)  Transportation Needs: No Transportation Needs (11/17/2023)  Utilities: Not At Risk (11/17/2023)  Alcohol Screen: Low Risk  (01/09/2019)  Depression (PHQ2-9): Low Risk  (09/11/2023)  Financial Resource Strain: Low Risk  (09/02/2021)  Physical Activity: Inactive (09/02/2021)  Social Connections: Socially Integrated (11/17/2023)  Stress: Stress Concern Present (09/02/2021)  Tobacco Use: Low Risk  (11/16/2023)    Readmission Risk Interventions    02/14/2023    4:54 PM  Readmission Risk Prevention Plan  Transportation Screening Complete  PCP or Specialist Appt within 3-5 Days Complete  HRI or Home Care Consult Complete  Social Work Consult for Recovery Care Planning/Counseling Complete  Palliative Care Screening Complete  Medication Review [employer]) Complete

## 2023-12-04 NOTE — Progress Notes (Signed)
 Nephrology Follow-Up Consult note   Assessment/Recommendations: Carolyn Russo is a/an 73 y.o. female with a past medical history significant for ESRD, admitted for AMS w/ AHRF, CAP, COVID, RSV.     Interval History/Subjective:  Extubated late yesterday Awake, lethargic this AM on 2L For HD today R internal jugular TDC  Physical Exam: Vitals:   12/01/23 0945 12/01/23 1000  BP:    Pulse: 69 67  Resp: (!) 21 19  Temp:    SpO2: 99% 100%   GEN: Chronically ill-appearing, lying in bed, no distress but not engaging ENT: coretrack in place.   EYES: no scleral icterus, eyes closed CV: normal rate, no rub PULM: Bilateral chest rise, ventilated ABD: NABS, non-distended SKIN: no rashes or jaundice; R internal jugular TDC exit site c/d EXT: minimal edema, warm and well perfused  Renal-related home meds: - rocaltrol  0.5 mwf - sensipar  30 every day - hydralazine  100 tid - losartan  50 bid - renvela  800mg  ac tid - others: brilinta , crestor , keppra , ritalin , neurontin , asa, PPI, dulera , insulin , albuterol , elavil    Dialysis Orders: MWF [facility]  4h   B350    75.5kg   2/2 bath   R fem TDC  Heparin  2600 + 1400 midrun - last HD 1/22, getting to dry wt - 3-4 drops in dry wt in last 3 wks - venofer  100 qhd thru 1/31 - mircera 150 q2, last 1/17, due 1/31    CXR 2/04 - no edema   Assessment/Plan: AHRF/ +COVID +RSV pna/ volume^ - VDRF started 1/30, extubated 12/03/23 SP course of IV abx. Per CCM.  CVA - R then L cerebellar infarcts d/t small vessel disease. Per neurology.  On  DAPT AMS - multifactorial. Neurology following.  ESRD - on HD MWF. HD today on schedule.  Hypotension - resolved Volume - down 3-4 kg from dry wt. UF 1 L next HD. Avoid hypotension.  Anemia of esrd - Hb at goal. Last aranesp  150 mcg 1/04. Hb is down to 8-9 range. Started weekly sq darbe 150mcg qFri. Tsat 26% and ferritin 1638. Pharm rec'd po ferrous sulfate  daily.  Transfuse as needed Secondary hyperparathyroidism - CCa  and phos in range. Resume binders when taking po H/o seizure d/o - on Keppra  PEA arrest- mgmt per ccm.    [name], MD  12/04/2023, 8:30 AM  Recent Labs  Lab 12/03/23 0325 12/04/23 0441  HGB 7.2* 7.0*  ALBUMIN  1.9* 1.9*  CALCIUM  8.6* 8.8*  9.4  PHOS 1.9* 2.6  CREATININE 2.54* 3.98*  3.94*  K 3.9 4.1  4.2    Inpatient medications:   stroke: early stages of recovery book   Does not apply Once   amitriptyline   12.5 mg Per Tube Daily   aspirin   81 mg Per Tube Daily   Chlorhexidine  Gluconate Cloth  6 each Topical Q0600   Chlorhexidine  Gluconate Cloth  6 each Topical Q0600   Chlorhexidine  Gluconate Cloth  6 each Topical Q0600   darbepoetin (ARANESP ) injection - DIALYSIS  150 mcg Subcutaneous Q Fri-1800   famotidine   10 mg Per Tube Daily   ferrous sulfate   300 mg Per Tube Daily   heparin  injection (subcutaneous)  5,000 Units Subcutaneous Q8H   hydrALAZINE   25 mg Per Tube Q8H   insulin  aspart  0-15 Units Subcutaneous Q4H   levETIRAcetam   500 mg Per Tube BID   lidocaine   1 patch Transdermal Q24H   multivitamin  1 tablet Per Tube QHS   mouth rinse  15 mL Mouth  Rinse Q4H   QUEtiapine   12.5 mg Per Tube BID   rosuvastatin   10 mg Per Tube Daily   sodium chloride  flush  3 mL Intravenous Q12H   ticagrelor   90 mg Per Tube BID    dexmedetomidine  (PRECEDEX ) IV infusion Stopped (12/03/23 1027)   feeding supplement (VITAL 1.5 CAL) 20 mL/hr at 12/04/23 0000   acetaminophen  **OR** acetaminophen , chlorpheniramine-HYDROcodone , docusate, fentaNYL  (SUBLIMAZE ) injection, labetalol , ondansetron  (ZOFRAN ) IV, mouth rinse, polyethylene glycol, witch hazel-glycerin 

## 2023-12-04 NOTE — Progress Notes (Signed)
 eLink Physician-Brief Progress Note Patient Name: Carolyn Russo DOB: 11-Mar-1951 MRN: [id number]   Date of Service  12/04/2023  HPI/Events of Note  Nonproductive cough  eICU Interventions  PRN robitussin-guaifenesin  ordered     Intervention Category Minor Interventions: Routine modifications to care plan (e.g. PRN medications for pain, fever)  Carolyn Russo 12/04/2023, 8:52 PM

## 2023-12-04 NOTE — Progress Notes (Addendum)
 NAME:  Carolyn Russo, MRN:  [id number], DOB:  [birth year], LOS: 18 ADMISSION DATE:  11/16/2023, CONSULTATION DATE:  1/30 REFERRING MD:  [facility], CHIEF COMPLAINT:  code blue   History of Present Illness:  Carolyn Russo is a 73 yo female with ESRD on HD MWF, CHF, previous CVA with residual left sided weakness, T2DM, right BKA, and seizure disorder. She presented to [facility] on 1/23 with confusion, left sided chest pain, shortness of breath, and coughing. She was found to be hypoxic on room air and placed on 2 L [place]. CT chest was done without evidence of PE.  MRI revealed a 1.3cm acute to early subacute right PICA infarct. Also was admitted with pneumonia due to COVID and RSV and was started on antibiotics for likely superimposed bacterial infection.    On 1/30 early morning the patient was ~2 hrs into dialysis when her blood pressure dropped. UF goal was decreased but since the patient's level of consciousnesses decreased, she received 300 cc NS bolus. EKG changes were noted and rapid response was activated, dialysis stopped, and blood returned to the patient. Around 6:30 AM the patient was noted to be apneic without a pulse and code blue was called. Patient was noted to have PEA arrest. CPR was done for a few minutes before ROSC was achieved. She was transferred to 26M and patient was intubated due to failure to protect airway.  Pertinent  Medical History  ESRD CHF CVA T2DM Seizures  Significant Hospital Events: Including procedures, antibiotic start and stop dates in addition to other pertinent events   1/23 admit to [facility] for acute infarct + covid/rsv pneumonia 1/30 admit to ICU after patient had PEA arrest  2/1 Failed SBT due to apnea. ABG with respiratory alkalosis. RR decreased 2/2 Tolerating SBT. Remains encephalopathic 2/5 Continues to tolerate SBT during day, but remains encephalopathic/unable to protect airway if extubated  2/9 extubated, off precedex   Interim History / Subjective:  Low grade  axillary temp of 100.8. Extubated yesterday afternoon. Awake, able to follow some commands.   Objective   Blood pressure (!) 165/46, pulse 76, temperature 100.2 F (37.9 C), temperature source Axillary, resp. rate (!) 23, height 5\' 3"  (1.6 m), weight 71.8 kg, SpO2 100%.    Vent Mode: CPAP;PSV PEEP:  [5 cmH20] 5 cmH20 Pressure Support:  [5 cmH20] 5 cmH20   Intake/Output Summary (Last 24 hours) at 12/04/2023 0754 Last data filed at 12/04/2023 0000 Gross per 24 hour  Intake 516.38 ml  Output --  Net 516.38 ml   Filed Weights   12/01/23 0500 12/02/23 0509 12/03/23 0400  Weight: 70.6 kg 71.8 kg 71.8 kg    Examination: Blood pressure (!) 156/62, pulse 62, temperature 99.3 F (37.4 C), temperature source Oral, resp. rate 14, height 5\' 3"  (1.6 m), weight 71.8 kg, SpO2 100%. Gen:      No acute distress. Frail Lungs:    Clear to auscultation bilaterally; normal respiratory effort on [place] CV:         Regular rate and rhythm; no murmurs Abd:      + bowel sounds; soft, non-tender, no distension Ext:    No edema, right BKA Skin:      Warm and dry; no rash Neuro:  Awake, able to follow few commands, not fully oriented  Lab/imaging reviewed Significant for sodium 133, BUN/creatinine 19/3.94 WBC 15.8 improved, hemoglobin 7.0 from 7.2, platelets 469 No new imaging  Resolved Hospital Problem list   Hypokalemia Hypotension Assessment & Plan:  PEA arrest: due to hypotension, not primarily cardiac in nature  Acute encephalopathy: secondary to post-arrest, stroke, metabolic derangements, and drug induced Patient remains altered, unable to track but able to follow few commands. Concern for cefepime  toxicity given her hx of ESRD, med was discontinued 2/4. MRI brain did show a new and acute central left cerebellar infarct.  - Appreciate neurology recs.  Stroke not felt to be significant enough to explain altered mental status - Adjusted seroquel  12.5 mg qhs for agitation - Limit centrally acting  medications as possible  Hypertension - Hypotensive initially, but now mildly hypertensive, getting HD today - Home hydralazine  restarted at lower dose, can titrate up if indicated  Pneumonia due to COVID and RSV Received empiric treatment with ceftriaxone  and azithro for possible superimposed bacterial pneumonia and also s/p course of solumedrol. Also received 6 days of cefepime  post-code- discontinued for improved resp status and possible neurotoxicity.   Ventilator dependence after PEA arrest, resolved Patient was not ever truly hypoxemic, was intubated for failure to protect airway. Extubated on 2/9. Family had declined tracheostomy and see how she does extubated. Currently on 2L [place] and saturating well.  - Monitor SpO2 - Continue supplemental O2 via , wean as tolerated   Acute to early subacute right PICA infarct on 1/24, resolved New acute central left cerebellar infarct  - Appreciate neuro follow up  - Continue DAPT with aspirin  and brilinta  - Continue statin - Needs 30d heart monitor at discharge  Abnormal C6 signal MRI showed a nonspecific bone marrow signal of c6 vertebral body - unclear if related to a malignancy vs discitis/osteomyelitis. Will hold off on C spine imaging, as family does not want to pursue this workup and priority is mental status/neuro recovery.   Leukocytosis - improving to 15.8, did have low grade temp of 100.9 (axillary) last night  - already completed abx course for PNA - trend fever curve  Seizure disorder - Continue home keppra  500 mg BID  ESRD on HD MWF - Dialysis per nephro - Discontinued renvela  due to low phos levels  Anemia of chronic disease - Hb 7.0 from 7.2 - 1 unit blood ordered 2/10 - Aranesp  every Friday   Diabetes - Continue SSI  Goals of Care Had extensive GOC conversation with patient's son at bedside and with husband via phone. We discussed early tracheostomy to help minimize discomfort and administration of sedation and  how if patient is able recover that this would not be a permanent fixture if she is able to rehab after discharge. Husband expresses preference to reach a point to trial off of ventilator before considering tracheostomy. We discussed risk of aspiration due to neurologic status in that scenario. Extubated on 2/9 and will continue to monitor her respiratory and O2 status.    Best Practice (right click and "Reselect all SmartList Selections" daily)   Diet/type: tubefeeds  DVT prophylaxis prophylactic heparin   Pressure ulcer(s): pressure ulcer assessment deferred  GI prophylaxis: H2B Lines: R subclavian HD cath, R femoral CVC, R femoral art line Foley:  N/A Code Status:  full code Last date of multidisciplinary goals of care discussion [2/7 updated husband at bedside]   Critical care time:

## 2023-12-05 DIAGNOSIS — G9340 Encephalopathy, unspecified: Secondary | ICD-10-CM | POA: Diagnosis not present

## 2023-12-05 DIAGNOSIS — J9601 Acute respiratory failure with hypoxia: Secondary | ICD-10-CM | POA: Diagnosis not present

## 2023-12-05 DIAGNOSIS — U071 COVID-19: Secondary | ICD-10-CM | POA: Diagnosis not present

## 2023-12-05 LAB — CBC
HCT: 25.7 % — ABNORMAL LOW (ref 36.0–46.0)
Hemoglobin: 8.9 g/dL — ABNORMAL LOW (ref 12.0–15.0)
MCH: 29.9 pg (ref 26.0–34.0)
MCHC: 34.6 g/dL (ref 30.0–36.0)
MCV: 86.2 fL (ref 80.0–100.0)
Platelets: 439 10*3/uL — ABNORMAL HIGH (ref 150–400)
RBC: 2.98 MIL/uL — ABNORMAL LOW (ref 3.87–5.11)
RDW: 17.3 % — ABNORMAL HIGH (ref 11.5–15.5)
WBC: 14.8 10*3/uL — ABNORMAL HIGH (ref 4.0–10.5)
nRBC: 0.2 % (ref 0.0–0.2)

## 2023-12-05 LAB — TYPE AND SCREEN
ABO/RH(D): O POS
Antibody Screen: NEGATIVE
Unit division: 0

## 2023-12-05 LAB — GLUCOSE, CAPILLARY
Glucose-Capillary: 143 mg/dL — ABNORMAL HIGH (ref 70–99)
Glucose-Capillary: 144 mg/dL — ABNORMAL HIGH (ref 70–99)
Glucose-Capillary: 168 mg/dL — ABNORMAL HIGH (ref 70–99)
Glucose-Capillary: 168 mg/dL — ABNORMAL HIGH (ref 70–99)
Glucose-Capillary: 188 mg/dL — ABNORMAL HIGH (ref 70–99)
Glucose-Capillary: 153 mg/dL — ABNORMAL HIGH (ref 70–99)

## 2023-12-05 LAB — POCT I-STAT 7, (LYTES, BLD GAS, ICA,H+H)
Acid-Base Excess: 5 mmol/L — ABNORMAL HIGH (ref 0.0–2.0)
Bicarbonate: 30.6 mmol/L — ABNORMAL HIGH (ref 20.0–28.0)
Calcium, Ion: 1.28 mmol/L (ref 1.15–1.40)
HCT: 31 % — ABNORMAL LOW (ref 36.0–46.0)
Hemoglobin: 10.5 g/dL — ABNORMAL LOW (ref 12.0–15.0)
O2 Saturation: 99 %
Potassium: 4.1 mmol/L (ref 3.5–5.1)
Sodium: 130 mmol/L — ABNORMAL LOW (ref 135–145)
TCO2: 32 mmol/L (ref 22–32)
pCO2 arterial: 47 mm[Hg] (ref 32–48)
pH, Arterial: 7.422 (ref 7.35–7.45)
pO2, Arterial: 142 mm[Hg] — ABNORMAL HIGH (ref 83–108)

## 2023-12-05 LAB — RENAL FUNCTION PANEL
Albumin: 2.1 g/dL — ABNORMAL LOW (ref 3.5–5.0)
Anion gap: 12 (ref 5–15)
BUN: 10 mg/dL (ref 8–23)
CO2: 26 mmol/L (ref 22–32)
Calcium: 8.7 mg/dL — ABNORMAL LOW (ref 8.9–10.3)
Chloride: 95 mmol/L — ABNORMAL LOW (ref 98–111)
Creatinine, Ser: 2.6 mg/dL — ABNORMAL HIGH (ref 0.44–1.00)
GFR, Estimated: 19 mL/min — ABNORMAL LOW (ref >60)
Glucose, Bld: 143 mg/dL — ABNORMAL HIGH (ref 70–99)
Phosphorus: 2.4 mg/dL — ABNORMAL LOW (ref 2.5–4.6)
Potassium: 4.4 mmol/L (ref 3.5–5.1)
Sodium: 133 mmol/L — ABNORMAL LOW (ref 135–145)

## 2023-12-05 LAB — BPAM RBC
Blood Product Expiration Date: 202503112359
ISSUE DATE / TIME: 202502101347
Unit Type and Rh: 5100

## 2023-12-05 MED ORDER — CHLORHEXIDINE GLUCONATE CLOTH 2 % EX PADS
6.0000 | MEDICATED_PAD | Freq: Every day | CUTANEOUS | Status: DC
  Administered 2023-12-05 – 2023-12-10 (×5): 6 via TOPICAL

## 2023-12-05 MED ORDER — JUVEN PO PACK
1.0000 | PACK | Freq: Two times a day (BID) | ORAL | Status: DC
  Administered 2023-12-05 – 2023-12-28 (×39): 1
  Filled 2023-12-05 (×38): qty 1

## 2023-12-05 MED ORDER — HYDRALAZINE HCL 50 MG PO TABS
50.0000 mg | ORAL_TABLET | Freq: Three times a day (TID) | ORAL | Status: DC
  Administered 2023-12-05 – 2023-12-19 (×38): 50 mg
  Filled 2023-12-05 (×40): qty 1

## 2023-12-05 MED ORDER — GUAIFENESIN 100 MG/5ML PO LIQD
5.0000 mL | Freq: Four times a day (QID) | ORAL | Status: DC
Start: 2023-12-05 — End: 2023-12-07
  Administered 2023-12-05 – 2023-12-07 (×7): 5 mL
  Filled 2023-12-05 (×6): qty 5

## 2023-12-05 MED ORDER — GUAIFENESIN 100 MG/5ML PO LIQD
5.0000 mL | ORAL | Status: DC | PRN
Start: 2023-12-05 — End: 2023-12-05

## 2023-12-05 NOTE — Progress Notes (Signed)
Nephrology Follow-Up Consult note   Assessment/Recommendations: Carolyn Russo is a/an 73 y.o. female with a past medical history significant for ESRD, admitted for AMS w/ AHRF, CAP, COVID, RSV.     Interval History/Subjective:  Awake but not engaging this morning Dialysis yesterday, 1.3 L ultrafiltration R internal jugular TDC  Physical Exam: Vitals:   12/01/23 0945 12/01/23 1000  BP:    Pulse: 69 67  Resp: (!) 21 19  Temp:    SpO2: 99% 100%   GEN: Chronically ill-appearing, lying in bed, no distress but not engaging ENT: coretrack in place.   EYES: no scleral icterus, eyes closed CV: normal rate, no rub PULM: Bilateral chest rise, ventilated ABD: NABS, non-distended SKIN: no rashes or jaundice; R internal jugular TDC exit site c/d EXT: minimal edema, warm and well perfused  Renal-related home meds: - rocaltrol 0.5 mwf - sensipar 30 every day - hydralazine 100 tid - losartan 50 bid - renvela 800mg  ac tid - others: brilinta, crestor, keppra, ritalin, neurontin, asa, PPI, dulera, insulin, albuterol, elavil   Dialysis Orders: MWF [facility]  4h   B350    75.5kg   2/2 bath   R fem TDC  Heparin 2600 + 1400 midrun - last HD 1/22, getting to dry wt - 3-4 drops in dry wt in last 3 wks - venofer 100 qhd thru 1/31 - mircera 150 q2, last 1/17, due 1/31    CXR 2/04 - no edema   Assessment/Plan: AHRF/ +COVID +RSV pna/ volume^ - VDRF started 1/30, extubated 12/03/23 SP course of IV abx. Per CCM.  Currently on 2 L [place] CVA - R then L cerebellar infarcts d/t small vessel disease. Per neurology.  On  DAPT AMS - multifactorial. Neurology following.  ESRD - on HD MWF.  HD tomorrow.  1 to 2 L UF.  No heparin.  TDC. Hypotension - resolved Hypertension/volume -under outpatient EDW.  On hydralazine.  Avoid hypotension.  Blood pressure is reasonable. Anemia of esrd -hemoglobin 8.9 today.  Last aranesp 150 mcg 1/04. Hb is down to 8-9 range.  On weekly sq darbe qFri. Tsat 26% and ferritin  1638. Pharm rec'd po ferrous sulfate daily.  Transfuse as needed Secondary hyperparathyroidism - CCa in range.  Hypophosphatemia, nutritional.  Not on binders.  Continue to hold binders.  H/o seizure d/o - on Keppra PEA arrest- mgmt per ccm.    [name], MD  12/05/2023, 9:49 AM  Recent Labs  Lab 12/04/23 0441 12/04/23 1952 12/05/23 0320  HGB 7.0* 9.4* 8.9*  ALBUMIN 1.9*  --  2.1*  CALCIUM 8.8*  9.4  --  8.7*  PHOS 2.6  --  2.4*  CREATININE 3.98*  3.94*  --  2.60*  K 4.1  4.2  --  4.4    Inpatient medications:   stroke: early stages of recovery book   Does not apply Once   amitriptyline  12.5 mg Per Tube QHS   arformoterol  15 mcg Nebulization BID   aspirin  81 mg Per Tube Daily   budesonide (PULMICORT) nebulizer solution  0.5 mg Nebulization BID   Chlorhexidine Gluconate Cloth  6 each Topical Q0600   darbepoetin (ARANESP) injection - DIALYSIS  150 mcg Subcutaneous Q Fri-1800   famotidine  10 mg Per Tube Daily   ferrous sulfate  300 mg Per Tube Daily   heparin injection (subcutaneous)  5,000 Units Subcutaneous Q8H   hydrALAZINE  25 mg Per Tube Q8H   insulin aspart  0-15 Units  Subcutaneous Q4H   levETIRAcetam  500 mg Per Tube BID   lidocaine  1 patch Transdermal Q24H   multivitamin  1 tablet Per Tube QHS   mouth rinse  15 mL Mouth Rinse Q4H   rosuvastatin  10 mg Per Tube Daily   sodium chloride flush  3 mL Intravenous Q12H   sodium chloride HYPERTONIC  4 mL Nebulization BID   ticagrelor  90 mg Per Tube BID    anticoagulant sodium citrate     feeding supplement (VITAL 1.5 CAL) 20 mL/hr at 12/05/23 0600   acetaminophen **OR** acetaminophen, anticoagulant sodium citrate, docusate, guaiFENesin-dextromethorphan, heparin, ipratropium-albuterol, labetalol, ondansetron (ZOFRAN) IV, mouth rinse, polyethylene glycol, witch hazel-glycerin

## 2023-12-05 NOTE — Progress Notes (Signed)
Nutrition Follow-up  DOCUMENTATION CODES:  Not applicable  INTERVENTION:  Continue TF via cortrak: Vital 1.5 at 45 ml/h (1080 ml per day) Increase to 28mL/h and if tolerated, advance to goal 2/12 Provides 1620 kcal, 73 gm protein, 825 ml free water daily Juven BID, each packet provides 95 calories, 2.5 grams of protein (collagen), and micronutrients to support wound healing Renal MVI daily  NUTRITION DIAGNOSIS:  Inadequate oral intake related to acute illness as evidenced by NPO status. - remains applicable   GOAL:  Patient will meet greater than or equal to 90% of their needs - progressing  MONITOR:  TF tolerance, I & O's, Labs, Weight trends  REASON FOR ASSESSMENT:  Ventilator    ASSESSMENT:  Pt  presented 1/23 with confusion and worsening L chest pain secondary to COVID and RSV infection. PMH significant for ESRD on HD, CHF, CVA with residual L sided weakness, T2DM, R BKA, seizure disorder.  1/23: MRI brain- revealed 1.3 cm acute to early subacute R PICA distribution infarct 1/28: MBS recommend dysphagia 2, thin liquids 1/30: PEA arrest; intubated; NPO  2/4: MRI brain- resolution of small R PICA cerebellar infarct, new/acute central L cerebellar infarct with no associated hemorrhage   2/9 - extubated  Pt resting in bed in the time of assessment. Able to be extubated over the weekend, but mentation still remains poor. TF at 77mL/h since 2/8. Discussed in rounds, ok to advance to 30mL and will advance to goal tomorrow if tolerated.   Admit weight: 79.5 kg   Current weight: 73.1 kg EDW prior to admission: 75.5 kg (nephrology notes several drops in dry wt over the last 3 weeks)   Intake/Output Summary (Last 24 hours) at 12/05/2023 1249 Last data filed at 12/05/2023 1000 Gross per 24 hour  Intake 746 ml  Output 1300 ml  Net -554 ml  Net IO Since Admission: 3,274.23 mL [12/05/23 1249]  Drains/Lines: Cortrak (distal stomach) Right AV fistula  Average Meal  Intake: 1/29: 18% intake x 2 recorded meals  Nutritionally Relevant Medications: Scheduled Meds:  famotidine  10 mg Per Tube Daily   ferrous sulfate  300 mg Per Tube Daily   insulin aspart  0-15 Units Subcutaneous Q4H   multivitamin  1 tablet Per Tube QHS   rosuvastatin  10 mg Per Tube Daily   ticagrelor  90 mg Per Tube BID   Continuous Infusions:  feeding supplement (VITAL 1.5 CAL) 20 mL/hr at 12/05/23 0600   PRN Meds: docusate, ondansetron (ZOFRAN) IV, polyethylene glycol  Labs Reviewed: Na 130, chloride 95 Creatinine 2.6 Phosphorus 2.4 CBG ranges from 120-168 mg/dL over the last 24 hours HgbA1c 5.8%  NUTRITION - FOCUSED PHYSICAL EXAM: Flowsheet Row Most Recent Value  Orbital Region No depletion  Upper Arm Region No depletion  Thoracic and Lumbar Region Unable to assess  Buccal Region Unable to assess  Temple Region No depletion  Clavicle Bone Region Mild depletion  Clavicle and Acromion Bone Region No depletion  Scapular Bone Region Mild depletion  Dorsal Hand Unable to assess  Patellar Region Moderate depletion  Anterior Thigh Region Moderate depletion  [R BKA]  Posterior Calf Region Moderate depletion  Edema (RD Assessment) None  Hair Reviewed  Eyes Reviewed  Mouth Unable to assess  Skin Reviewed  Nails Unable to assess    Diet Order:   Diet Order             Diet NPO time specified  Diet effective now  EDUCATION NEEDS:  No education needs have been identified at this time  Skin:  Skin Assessment: Reviewed RN Assessment Stage 2: - Medial Sacrum  Last BM:  2/10 - type 5  Height:  Ht Readings from Last 1 Encounters:  11/28/23 5\' 3"  (1.6 m)    Weight:  Wt Readings from Last 1 Encounters:  12/05/23 73.1 kg    Ideal Body Weight:  52.3 kg  BMI:  Body mass index is 28.55 kg/m.  Estimated Nutritional Needs:  Kcal:  1600-1800 kcal/d Protein:  80-100 g/d Fluid:  1L + UOP    Greig Castilla, RD, LDN Registered  Dietitian II Please reach out via secure chat Weekend on-call pager # available in [facility]

## 2023-12-05 NOTE — Progress Notes (Signed)
NAME:  Carolyn Russo, MRN:  [id number], DOB:  1950/11/25, LOS: 19 ADMISSION DATE:  11/16/2023, CONSULTATION DATE:  1/30 REFERRING MD:  TRH, CHIEF COMPLAINT:  code blue   History of Present Illness:  Carolyn Russo is a 73 yo female with ESRD on HD MWF, CHF, previous CVA with residual left sided weakness, T2DM, right BKA, and seizure disorder. She presented to [facility] on 1/23 with confusion, left sided chest pain, shortness of breath, and coughing. She was found to be hypoxic on room air and placed on 2 L [place]. CT chest was done without evidence of PE.  MRI revealed a 1.3cm acute to early subacute right PICA infarct. Also was admitted with pneumonia due to COVID and RSV and was started on antibiotics for likely superimposed bacterial infection.    On 1/30 early morning the patient was ~2 hrs into dialysis when her blood pressure dropped. UF goal was decreased but since the patient's level of consciousnesses decreased, she received 300 cc NS bolus. EKG changes were noted and rapid response was activated, dialysis stopped, and blood returned to the patient. Around 6:30 AM the patient was noted to be apneic without a pulse and code blue was called. Patient was noted to have PEA arrest. CPR was done for a few minutes before ROSC was achieved. She was transferred to 76M and patient was intubated due to failure to protect airway.  Pertinent  Medical History  ESRD CHF CVA T2DM Seizures  Significant Hospital Events: Including procedures, antibiotic start and stop dates in addition to other pertinent events   1/23 admit to [facility] for acute infarct + covid/rsv pneumonia 1/30 admit to ICU after patient had PEA arrest  2/1 Failed SBT due to apnea. ABG with respiratory alkalosis. RR decreased 2/2 Tolerating SBT. Remains encephalopathic 2/5 Continues to tolerate SBT during day, but remains encephalopathic/unable to protect airway if extubated  2/9 extubated, off precedex  Interim History / Subjective:  Sleeping,  did not sleep last night with cough.   Objective   Blood pressure (!) 157/56, pulse 71, temperature 99.9 F (37.7 C), temperature source Axillary, resp. rate (!) 26, height 5\' 3"  (1.6 m), weight 73.1 kg, SpO2 100%.        Intake/Output Summary (Last 24 hours) at 12/05/2023 0635 Last data filed at 12/05/2023 0600 Gross per 24 hour  Intake 846 ml  Output 1300 ml  Net -454 ml   Filed Weights   12/04/23 0917 12/04/23 1330 12/05/23 0152  Weight: 71.8 kg 73.8 kg 73.1 kg    Examination: Gen:      No acute distress. Frail Lungs:    Clear to auscultation bilaterally; normal respiratory effort on 2L [place] CV:         Regular rate and rhythm; no murmurs Abd:      + bowel sounds; soft, non-tender, no distension Ext:    No edema, right BKA Skin:      Warm and dry; no rash Neuro:  Somnolent this morning  Lab/imaging reviewed Significant for sodium 133, BUN/creatinine 10/2.6 WBC 14.8 improved, hemoglobin 8.9 s/p 1unit PRBC, platelets 439 No new imaging  Resolved Hospital Problem list   Hypokalemia Hypotension Assessment & Plan:   PEA arrest: due to hypotension, not primarily cardiac in nature  Acute encephalopathy: secondary to post-arrest, stroke, metabolic derangements, and drug induced Patient remains altered, unable to track but able to follow few commands. This morning was more somnolent after poor sleep last night with coughing. Concern for cefepime toxicity  given her hx of ESRD, med was discontinued 2/4. MRI brain did show a new and acute central left cerebellar infarct.  - Appreciate neurology recs.  Stroke not felt to be significant enough to explain altered mental status - Stopped seroquel - Adjusted amitriptyline to at bedtime  - Limit centrally acting medications as possible  Hypertension - Hypotensive initially, but now mildly hypertensive, getting HD  - Home hydralazine at reduced dose, monitor  - nephrology following   Pneumonia due to COVID and RSV Received empiric  treatment with ceftriaxone and azithro for possible superimposed bacterial pneumonia and also s/p course of solumedrol. Also received 6 days of cefepime post-code- discontinued for improved resp status and possible neurotoxicity.   Ventilator dependence after PEA arrest, resolved Patient was not ever truly hypoxemic, was intubated for failure to protect airway. Extubated on 2/9. Family had declined tracheostomy and see how she does extubated. Currently on 2L [place] and saturating well.  - Monitor SpO2 - Continue supplemental O2 via [place], wean as tolerated   Acute to early subacute right PICA infarct on 1/24, resolved New acute central left cerebellar infarct  - Appreciate neuro follow up  - Continue DAPT with aspirin and brilinta - Continue statin - Needs 30d heart monitor at discharge  Abnormal C6 signal MRI showed a nonspecific bone marrow signal of c6 vertebral body - unclear if related to a malignancy vs discitis/osteomyelitis. Will hold off on C spine imaging, as family does not want to pursue this workup and priority is mental status/neuro recovery.   Leukocytosis - no fever overnight, WBC trending down  - already completed abx course for PNA - trend fever curve  Seizure disorder - Continue home keppra 500 mg BID  ESRD on HD MWF - Dialysis per nephro - Discontinued renvela due to low phos levels  Anemia of chronic disease - 1 unit blood on 2/10, Hgb improved - Aranesp every Friday   Diabetes - Continue SSI  Goals of Care Had extensive GOC conversation with patient's son at bedside and with husband via phone. We discussed early tracheostomy to help minimize discomfort and administration of sedation and how if patient is able recover that this would not be a permanent fixture if she is able to rehab after discharge. Husband expresses preference to reach a point to trial off of ventilator before considering tracheostomy. We discussed risk of aspiration due to neurologic status in  that scenario. Extubated on 2/9 and will continue to monitor her respiratory and O2 status.    Best Practice (right click and "Reselect all SmartList Selections" daily)   Diet/type: tubefeeds  DVT prophylaxis prophylactic heparin  Pressure ulcer(s): pressure ulcer assessment deferred  GI prophylaxis: H2B Lines: R subclavian HD cath,  Foley:  N/A Code Status:  full code Last date of multidisciplinary goals of care discussion [2/10 updated husband at bedside]   Critical care time:

## 2023-12-05 NOTE — TOC Progression Note (Signed)
Transition of Care [facility]) - Progression Note    Patient Details  Name: ANAGHA LOSEKE MRN: [id number] Date of Birth: 1951-05-01  Transition of Care [facility]) CM/SW Contact  Marliss Coots, LCSW Phone Number: 12/05/2023, 12:09 PM  Clinical Narrative:     12:09 PM Per progressions, patient is not medically ready to discharge to SNF due to lethargy and trickle feeds. Feeds are to be advanced.  Expected Discharge Plan: Skilled Nursing Facility    Expected Discharge Plan and Services In-house Referral: Clinical Social Work     Living arrangements for the past 2 months: Single Family Home, Skilled Nursing Facility                                       Social Determinants of Health (SDOH) Interventions SDOH Screenings   Food Insecurity: No Food Insecurity (11/17/2023)  Housing: Low Risk  (11/17/2023)  Transportation Needs: No Transportation Needs (11/17/2023)  Utilities: Not At Risk (11/17/2023)  Alcohol Screen: Low Risk  (01/09/2019)  Depression (PHQ2-9): Low Risk  (09/11/2023)  Financial Resource Strain: Low Risk  (09/02/2021)  Physical Activity: Inactive (09/02/2021)  Social Connections: Socially Integrated (11/17/2023)  Stress: Stress Concern Present (09/02/2021)  Tobacco Use: Low Risk  (11/16/2023)    Readmission Risk Interventions    02/14/2023    4:54 PM  Readmission Risk Prevention Plan  Transportation Screening Complete  PCP or Specialist Appt within 3-5 Days Complete  HRI or Home Care Consult Complete  Social Work Consult for Recovery Care Planning/Counseling Complete  Palliative Care Screening Complete  Medication Review Oceanographer) Complete

## 2023-12-05 NOTE — Progress Notes (Signed)
OT Cancellation Note  Patient Details Name: TAYLOR SPILDE MRN: [id number] DOB: [birth year]   Cancelled Treatment:    Reason Eval/Treat Not Completed: Fatigue/lethargy limiting ability to participate (per RN pt obtunded, will check back for OT evaluation as appropriate)  Carver Fila, OTD, OTR/L SecureChat Preferred Acute Rehab [phone]   Carver Fila Koonce 12/05/2023, 1:45 PM

## 2023-12-06 ENCOUNTER — Inpatient Hospital Stay (HOSPITAL_COMMUNITY): Payer: Medicare PPO

## 2023-12-06 DIAGNOSIS — J9601 Acute respiratory failure with hypoxia: Secondary | ICD-10-CM | POA: Diagnosis not present

## 2023-12-06 DIAGNOSIS — G9341 Metabolic encephalopathy: Secondary | ICD-10-CM

## 2023-12-06 DIAGNOSIS — U071 COVID-19: Secondary | ICD-10-CM | POA: Diagnosis not present

## 2023-12-06 LAB — RENAL FUNCTION PANEL
Albumin: 2.1 g/dL — ABNORMAL LOW (ref 3.5–5.0)
Anion gap: 18 — ABNORMAL HIGH (ref 5–15)
BUN: 29 mg/dL — ABNORMAL HIGH (ref 8–23)
CO2: 27 mmol/L (ref 22–32)
Calcium: 9.7 mg/dL (ref 8.9–10.3)
Chloride: 88 mmol/L — ABNORMAL LOW (ref 98–111)
Creatinine, Ser: 3.75 mg/dL — ABNORMAL HIGH (ref 0.44–1.00)
GFR, Estimated: 12 mL/min — ABNORMAL LOW (ref >60)
Glucose, Bld: 195 mg/dL — ABNORMAL HIGH (ref 70–99)
Phosphorus: 2.5 mg/dL (ref 2.5–4.6)
Potassium: 4.7 mmol/L (ref 3.5–5.1)
Sodium: 133 mmol/L — ABNORMAL LOW (ref 135–145)

## 2023-12-06 LAB — CBC
HCT: 26.9 % — ABNORMAL LOW (ref 36.0–46.0)
Hemoglobin: 9.2 g/dL — ABNORMAL LOW (ref 12.0–15.0)
MCH: 29.7 pg (ref 26.0–34.0)
MCHC: 34.2 g/dL (ref 30.0–36.0)
MCV: 86.8 fL (ref 80.0–100.0)
Platelets: 464 10*3/uL — ABNORMAL HIGH (ref 150–400)
RBC: 3.1 MIL/uL — ABNORMAL LOW (ref 3.87–5.11)
RDW: 16.9 % — ABNORMAL HIGH (ref 11.5–15.5)
WBC: 17.9 10*3/uL — ABNORMAL HIGH (ref 4.0–10.5)
nRBC: 0.1 % (ref 0.0–0.2)

## 2023-12-06 LAB — GLUCOSE, CAPILLARY
Glucose-Capillary: 194 mg/dL — ABNORMAL HIGH (ref 70–99)
Glucose-Capillary: 173 mg/dL — ABNORMAL HIGH (ref 70–99)
Glucose-Capillary: 133 mg/dL — ABNORMAL HIGH (ref 70–99)
Glucose-Capillary: 133 mg/dL — ABNORMAL HIGH (ref 70–99)
Glucose-Capillary: 200 mg/dL — ABNORMAL HIGH (ref 70–99)
Glucose-Capillary: 175 mg/dL — ABNORMAL HIGH (ref 70–99)

## 2023-12-06 MED ORDER — HEPARIN SODIUM (PORCINE) 1000 UNIT/ML IJ SOLN
INTRAMUSCULAR | Status: AC
  Administered 2023-12-06: 5000 [IU]
  Filled 2023-12-06: qty 35

## 2023-12-06 MED ORDER — WHITE PETROLATUM EX OINT
TOPICAL_OINTMENT | CUTANEOUS | Status: DC | PRN
  Administered 2023-12-10 – 2023-12-11 (×2): 0.2 via TOPICAL
  Filled 2023-12-06 (×2): qty 28.35

## 2023-12-06 MED ORDER — AMOXICILLIN-POT CLAVULANATE 400-57 MG/5ML PO SUSR
500.0000 mg | Freq: Two times a day (BID) | ORAL | Status: DC
  Administered 2023-12-06 – 2023-12-09 (×7): 500 mg
  Filled 2023-12-06 (×10): qty 6.3

## 2023-12-06 MED ORDER — AMOXICILLIN-POT CLAVULANATE 250-62.5 MG/5ML PO SUSR
500.0000 mg | Freq: Two times a day (BID) | ORAL | Status: DC
  Filled 2023-12-06 (×2): qty 10

## 2023-12-06 NOTE — Progress Notes (Signed)
NAME:  Carolyn Russo, MRN:  [id number], DOB:  20-May-1951, LOS: 20 ADMISSION DATE:  11/16/2023, CONSULTATION DATE:  1/30 REFERRING MD:  TRH, CHIEF COMPLAINT:  code blue   History of Present Illness:  Carolyn Russo is a 73 yo female with ESRD on HD MWF, CHF, previous CVA with residual left sided weakness, T2DM, right BKA, and seizure disorder. She presented to [facility] on 1/23 with confusion, left sided chest pain, shortness of breath, and coughing. She was found to be hypoxic on room air and placed on 2 L [place]. CT chest was done without evidence of PE.  MRI revealed a 1.3cm acute to early subacute right PICA infarct. Also was admitted with pneumonia due to COVID and RSV and was started on antibiotics for likely superimposed bacterial infection.    On 1/30 early morning the patient was ~2 hrs into dialysis when her blood pressure dropped. UF goal was decreased but since the patient's level of consciousnesses decreased, she received 300 cc NS bolus. EKG changes were noted and rapid response was activated, dialysis stopped, and blood returned to the patient. Around 6:30 AM the patient was noted to be apneic without a pulse and code blue was called. Patient was noted to have PEA arrest. CPR was done for a few minutes before ROSC was achieved. She was transferred to 21M and patient was intubated due to failure to protect airway.  Pertinent  Medical History  ESRD CHF CVA T2DM Seizures  Significant Hospital Events: Including procedures, antibiotic start and stop dates in addition to other pertinent events   1/23 admit to [facility] for acute infarct + covid/rsv pneumonia 1/30 admit to ICU after patient had PEA arrest  2/1 Failed SBT due to apnea. ABG with respiratory alkalosis. RR decreased 2/2 Tolerating SBT. Remains encephalopathic 2/5 Continues to tolerate SBT during day, but remains encephalopathic/unable to protect airway if extubated  2/9 extubated, off precedex 2/12 still lethargic, ABG 2/11  unremarkable, limit centrally acting meds  Interim History / Subjective:  Opens eyes, states name but otherwise does not follow commands.   Objective   Blood pressure (!) 165/66, pulse 82, temperature (!) 94.7 F (34.8 C), temperature source Axillary, resp. rate (!) 28, height 5\' 3"  (1.6 m), weight 73.1 kg, SpO2 98%.        Intake/Output Summary (Last 24 hours) at 12/06/2023 0658 Last data filed at 12/06/2023 0000 Gross per 24 hour  Intake 767 ml  Output --  Net 767 ml   Filed Weights   12/04/23 0917 12/04/23 1330 12/05/23 0152  Weight: 71.8 kg 73.8 kg 73.1 kg    Examination: Gen:      No acute distress. Frail Lungs:    Clear to auscultation bilaterally; normal respiratory effort on [place] CV:         Regular rate and rhythm; no murmurs Abd:      + bowel sounds; soft, non-tender, no distension Ext:    No edema, right BKA Skin:      Warm and dry; no rash Neuro:  Somnolent this morning but opens eyes, does not follow commands  Lab/imaging reviewed Significant for sodium 133, BUN/creatinine 29/3.75 WBC up 17.9, hemoglobin 9.2 s/p 1unit PRBC, platelets 464 No new imaging  Resolved Hospital Problem list   Hypokalemia Hypotension Assessment & Plan:  PEA arrest: due to hypotension, not primarily cardiac in nature  Acute encephalopathy: secondary to post-arrest, stroke, metabolic derangements, and drug induced. Concern for cefepime toxicity given her hx of ESRD, med was  discontinued 2/4. MRI brain did show a new and acute central left cerebellar infarct.  - Opens eyes but not able to follow commands - Appreciate neurology recs.  Stroke not felt to be significant enough to explain altered mental status - Stopped seroquel and amitriptyline - Limit centrally acting medications as possible - Repeat CXR to evaluate aspiration   Hypertension - Hypotensive initially, but now mildly hypertensive, getting HD  - Home hydralazine at reduced dose, monitor  - nephrology following    Pneumonia due to COVID and RSV Received empiric treatment with ceftriaxone and azithro for possible superimposed bacterial pneumonia and also s/p course of solumedrol. Also received 6 days of cefepime post-code- discontinued for improved resp status and possible neurotoxicity.   Ventilator dependence after PEA arrest, resolved Patient was not ever truly hypoxemic, was intubated for failure to protect airway. Extubated on 2/9. Family had declined tracheostomy and see how she does extubated. Currently on [place] and saturating well.  - Monitor SpO2 - Continue supplemental O2 via [place], wean as tolerated   Acute to early subacute right PICA infarct on 1/24, resolved New acute central left cerebellar infarct  - Appreciate neuro follow up  - Continue DAPT with aspirin and brilinta - Continue statin - Needs 30d heart monitor at discharge  Abnormal C6 signal MRI showed a nonspecific bone marrow signal of c6 vertebral body - unclear if related to a malignancy vs discitis/osteomyelitis. Will hold off on C spine imaging, as family does not want to pursue this workup and priority is mental status/neuro recovery.  - If no other findings, consider C spine MRI to further evaluate  Leukocytosis C/f sinusitis  - no fever overnight, WBC trending up  - already completed abx course for PNA - trend fever curve - MRI brain noted mastoid effusions, will treat with Augmentin x 10 days renally dosed   Seizure disorder - Continue home keppra 500 mg BID  ESRD on HD MWF - Dialysis per nephro - Discontinued renvela due to low phos levels  Anemia of chronic disease - 1 unit blood on 2/10, Hgb stable - trend CBC - Aranesp every Friday   Diabetes - Continue SSI - Tube feeds  Goals of Care Had extensive GOC conversation with patient's son at bedside and with husband via phone. We discussed early tracheostomy to help minimize discomfort and administration of sedation and how if patient is able recover that  this would not be a permanent fixture if she is able to rehab after discharge. Husband expresses preference to reach a point to trial off of ventilator before considering tracheostomy. We discussed risk of aspiration due to neurologic status in that scenario. Extubated on 2/9 and will continue to monitor her respiratory and O2 status.    Best Practice (right click and "Reselect all SmartList Selections" daily)   Diet/type: tubefeeds  DVT prophylaxis prophylactic heparin  Pressure ulcer(s): pressure ulcer assessment deferred  GI prophylaxis: H2B Lines: R subclavian HD cath,  Foley:  N/A Code Status:  full code Last date of multidisciplinary goals of care discussion [2/12 updated husband at bedside]   Critical care time:

## 2023-12-06 NOTE — Progress Notes (Signed)
OT Cancellation Note  Patient Details Name: AMIJAH TIMOTHY MRN: [id number] DOB: [birth year]   Cancelled Treatment:    Reason Eval/Treat Not Completed: Patient at procedure or test/ unavailable (pt undergoing HD in room. Will follow up for OT eval as schedule permits)  Carver Fila, OTD, OTR/L SecureChat Preferred Acute Rehab [phone]   Dalphine Handing 12/06/2023, 12:22 PM

## 2023-12-06 NOTE — Progress Notes (Signed)
Nephrology Follow-Up Consult note   Assessment/Recommendations: Carolyn Russo is a/an 73 y.o. female with a past medical history significant for ESRD, admitted for AMS w/ AHRF, CAP, COVID, RSV.   Persistent encephalopathy  Interval History/Subjective:  Remains encephalopathic, not waking up very much to verbal or physical stimuli  On 1 L nasal cannula, blood pressures fairly stable For HD today R internal jugular TDC  Physical Exam: Vitals:   12/01/23 0945 12/01/23 1000  BP:    Pulse: 69 67  Resp: (!) 21 19  Temp:    SpO2: 99% 100%   GEN: Chronically ill-appearing, lying in bed, no distress but not engaging ENT: coretrack in place.   EYES: no scleral icterus, eyes closed CV: normal rate, no rub PULM: Bilateral chest rise, ventilated ABD: NABS, non-distended SKIN: no rashes or jaundice; R internal jugular TDC exit site c/d EXT: minimal edema, warm and well perfused  Renal-related home meds: - rocaltrol 0.5 mwf - sensipar 30 every day - hydralazine 100 tid - losartan 50 bid - renvela 800mg  ac tid - others: brilinta, crestor, keppra, ritalin, neurontin, asa, PPI, dulera, insulin, albuterol, elavil   Dialysis Orders: MWF NW  4h   B350    75.5kg   2/2 bath   R fem TDC  Heparin 2600 + 1400 midrun - last HD 1/22, getting to dry wt - 3-4 drops in dry wt in last 3 wks - venofer 100 qhd thru 1/31 - mircera 150 q2, last 1/17, due 1/31    CXR 2/04 - no edema   Assessment/Plan: AHRF/ +COVID +RSV pna/ volume^ - VDRF started 1/30, extubated 12/03/23 SP course of IV abx. Per CCM.  CVA - R then L cerebellar infarcts d/t small vessel disease. Per neurology.  On  DAPT AMS - multifactorial. Neurology following.  Not much improvement in the past 2 days ESRD - on HD MWF. HD today on schedule.  Hypotension - resolved Volume - down 3-4 kg from dry wt. UF 1-2 L next HD. Avoid hypotension.  Anemia of esrd - Hb is down to 8-9 range. Weekly sq darbe qFri. Tsat 26% and ferritin 1638.  Pharm rec'd po ferrous sulfate daily.  Transfuse as needed Secondary hyperparathyroidism - phos in range. Calcium up and down.Resume binders when taking po H/o seizure d/o - on Keppra PEA arrest- mgmt per ccm.    [name], MD  12/06/2023, 10:05 AM  Recent Labs  Lab 12/05/23 0320 12/05/23 1038 12/06/23 0351  HGB 8.9* 10.5* 9.2*  ALBUMIN 2.1*  --  2.1*  CALCIUM 8.7*  --  9.7  PHOS 2.4*  --  2.5  CREATININE 2.60*  --  3.75*  K 4.4 4.1 4.7    Inpatient medications:   stroke: early stages of recovery book   Does not apply Once   arformoterol  15 mcg Nebulization BID   aspirin  81 mg Per Tube Daily   budesonide (PULMICORT) nebulizer solution  0.5 mg Nebulization BID   Chlorhexidine Gluconate Cloth  6 each Topical Q0600   Chlorhexidine Gluconate Cloth  6 each Topical Q0600   darbepoetin (ARANESP) injection - DIALYSIS  150 mcg Subcutaneous Q Fri-1800   ferrous sulfate  300 mg Per Tube Daily   guaiFENesin  5 mL Per Tube Q6H   heparin injection (subcutaneous)  5,000 Units Subcutaneous Q8H   heparin sodium (porcine)       hydrALAZINE  50 mg Per Tube Q8H   insulin aspart  0-15 Units Subcutaneous Q4H  levETIRAcetam  500 mg Per Tube BID   lidocaine  1 patch Transdermal Q24H   multivitamin  1 tablet Per Tube QHS   nutrition supplement (JUVEN)  1 packet Per Tube BID BM   mouth rinse  15 mL Mouth Rinse Q4H   rosuvastatin  10 mg Per Tube Daily   sodium chloride flush  3 mL Intravenous Q12H   sodium chloride HYPERTONIC  4 mL Nebulization BID   ticagrelor  90 mg Per Tube BID    anticoagulant sodium citrate     feeding supplement (VITAL 1.5 CAL) 30 mL/hr at 12/06/23 0700   acetaminophen **OR** acetaminophen, anticoagulant sodium citrate, docusate, heparin, heparin sodium (porcine), ipratropium-albuterol, labetalol, ondansetron (ZOFRAN) IV, mouth rinse, polyethylene glycol, witch hazel-glycerin

## 2023-12-06 NOTE — Plan of Care (Signed)
  Problem: Fluid Volume: Goal: Ability to maintain a balanced intake and output will improve Outcome: Progressing   Problem: Skin Integrity: Goal: Risk for impaired skin integrity will decrease Outcome: Progressing   Problem: Nutrition: Goal: Dietary intake will improve Outcome: Progressing

## 2023-12-06 NOTE — Progress Notes (Addendum)
  Alert.  Informed consent signed and in chart.   TX duration:3.5hrs  Patient tolerated well.  Alert, without acute distress.  Hand-off given to patient's nurse.   Access used: RTDC Access issues: none  Total UF removed: 1.5 Medication(s) given: post tx TDC heparin blocks   12/06/23 1337  Vitals  BP 124/87  Pulse Rate 94  ECG Heart Rate 100  Resp (!) 25  Oxygen Therapy  SpO2 100 %  During Treatment Monitoring  Blood Flow Rate (mL/min) 399 mL/min  Arterial Pressure (mmHg) -188.88 mmHg  Venous Pressure (mmHg) 186.45 mmHg  TMP (mmHg) 0.61 mmHg  Ultrafiltration Rate (mL/min) 812 mL/min  Dialysate Flow Rate (mL/min) 299 ml/min  Duration of HD Treatment -hour(s) 3.47 hour(s)  Cumulative Fluid Removed (mL) per Treatment  1476.75  HD Safety Checks Performed Yes  Intra-Hemodialysis Comments Tx completed  Dialysis Fluid Bolus Normal Saline  Bolus Amount (mL) 300 mL  Post Treatment  Dialyzer Clearance Clear  Hemodialysis Intake (mL) 100 mL  Liters Processed 84  Fluid Removed (mL) 1600 mL  Tolerated HD Treatment Yes  Hemodialysis Catheter Right Subclavian  No placement date or time found.   Orientation: Right  Access Location: Subclavian  Site Condition No complications  Blue Lumen Status Flushed;Heparin locked  Red Lumen Status Flushed;Heparin locked  Catheter fill solution Heparin 1000 units/ml  Catheter fill volume (Arterial) 2.1 cc  Catheter fill volume (Venous) 2.1  Dressing Type Transparent  Dressing Status Antimicrobial disc/dressing in place  Dressing Change Due 12/11/23  Post treatment catheter status Capped and Clamped      Freddi Starr, RN [facility]

## 2023-12-07 DIAGNOSIS — U071 COVID-19: Secondary | ICD-10-CM | POA: Diagnosis not present

## 2023-12-07 DIAGNOSIS — G9341 Metabolic encephalopathy: Secondary | ICD-10-CM | POA: Diagnosis not present

## 2023-12-07 DIAGNOSIS — J9601 Acute respiratory failure with hypoxia: Secondary | ICD-10-CM | POA: Diagnosis not present

## 2023-12-07 LAB — GLUCOSE, CAPILLARY
Glucose-Capillary: 227 mg/dL — ABNORMAL HIGH (ref 70–99)
Glucose-Capillary: 168 mg/dL — ABNORMAL HIGH (ref 70–99)
Glucose-Capillary: 196 mg/dL — ABNORMAL HIGH (ref 70–99)
Glucose-Capillary: 232 mg/dL — ABNORMAL HIGH (ref 70–99)
Glucose-Capillary: 234 mg/dL — ABNORMAL HIGH (ref 70–99)
Glucose-Capillary: 211 mg/dL — ABNORMAL HIGH (ref 70–99)

## 2023-12-07 LAB — CBC WITH DIFFERENTIAL/PLATELET
Abs Immature Granulocytes: 0.27 10*3/uL — ABNORMAL HIGH (ref 0.00–0.07)
Basophils Absolute: 0.1 10*3/uL (ref 0.0–0.1)
Basophils Relative: 0 %
Eosinophils Absolute: 0.3 10*3/uL (ref 0.0–0.5)
Eosinophils Relative: 2 %
HCT: 27.2 % — ABNORMAL LOW (ref 36.0–46.0)
Hemoglobin: 9.3 g/dL — ABNORMAL LOW (ref 12.0–15.0)
Immature Granulocytes: 2 %
Lymphocytes Relative: 11 %
Lymphs Abs: 2 10*3/uL (ref 0.7–4.0)
MCH: 30 pg (ref 26.0–34.0)
MCHC: 34.2 g/dL (ref 30.0–36.0)
MCV: 87.7 fL (ref 80.0–100.0)
Monocytes Absolute: 1.6 10*3/uL — ABNORMAL HIGH (ref 0.1–1.0)
Monocytes Relative: 9 %
Neutro Abs: 13 10*3/uL — ABNORMAL HIGH (ref 1.7–7.7)
Neutrophils Relative %: 76 %
Platelets: 456 10*3/uL — ABNORMAL HIGH (ref 150–400)
RBC: 3.1 MIL/uL — ABNORMAL LOW (ref 3.87–5.11)
RDW: 16.8 % — ABNORMAL HIGH (ref 11.5–15.5)
WBC: 17.1 10*3/uL — ABNORMAL HIGH (ref 4.0–10.5)
nRBC: 0.1 % (ref 0.0–0.2)

## 2023-12-07 LAB — RENAL FUNCTION PANEL
Albumin: 2 g/dL — ABNORMAL LOW (ref 3.5–5.0)
Anion gap: 14 (ref 5–15)
BUN: 25 mg/dL — ABNORMAL HIGH (ref 8–23)
CO2: 28 mmol/L (ref 22–32)
Calcium: 9 mg/dL (ref 8.9–10.3)
Chloride: 90 mmol/L — ABNORMAL LOW (ref 98–111)
Creatinine, Ser: 2.64 mg/dL — ABNORMAL HIGH (ref 0.44–1.00)
GFR, Estimated: 18 mL/min — ABNORMAL LOW (ref >60)
Glucose, Bld: 196 mg/dL — ABNORMAL HIGH (ref 70–99)
Phosphorus: 1.7 mg/dL — ABNORMAL LOW (ref 2.5–4.6)
Potassium: 3.9 mmol/L (ref 3.5–5.1)
Sodium: 132 mmol/L — ABNORMAL LOW (ref 135–145)

## 2023-12-07 MED ORDER — K PHOS MONO-SOD PHOS DI & MONO 155-852-130 MG PO TABS
500.0000 mg | ORAL_TABLET | ORAL | Status: AC
  Administered 2023-12-07 (×2): 500 mg
  Filled 2023-12-07 (×2): qty 2

## 2023-12-07 MED ORDER — GUAIFENESIN 100 MG/5ML PO LIQD
5.0000 mL | Freq: Four times a day (QID) | ORAL | Status: AC
Start: 2023-12-07 — End: 2023-12-09
  Administered 2023-12-07 – 2023-12-09 (×8): 5 mL
  Filled 2023-12-07 (×8): qty 5

## 2023-12-07 MED ORDER — CHLORHEXIDINE GLUCONATE CLOTH 2 % EX PADS
6.0000 | MEDICATED_PAD | Freq: Every day | CUTANEOUS | Status: DC
  Administered 2023-12-07 – 2023-12-10 (×4): 6 via TOPICAL

## 2023-12-07 NOTE — Progress Notes (Signed)
NAME:  Carolyn Russo, MRN:  [id number], DOB:  09/18/1951, LOS: 21 ADMISSION DATE:  11/16/2023, CONSULTATION DATE:  1/30 REFERRING MD:  TRH, CHIEF COMPLAINT:  code blue   History of Present Illness:  Carolyn Russo is a 73 yo female with ESRD on HD MWF, CHF, previous CVA with residual left sided weakness, T2DM, right BKA, and seizure disorder. She presented to [facility] on 1/23 with confusion, left sided chest pain, shortness of breath, and coughing. She was found to be hypoxic on room air and placed on 2 L [place]. CT chest was done without evidence of PE.  MRI revealed a 1.3cm acute to early subacute right PICA infarct. Also was admitted with pneumonia due to COVID and RSV and was started on antibiotics for likely superimposed bacterial infection.    On 1/30 early morning the patient was ~2 hrs into dialysis when her blood pressure dropped. UF goal was decreased but since the patient's level of consciousnesses decreased, she received 300 cc NS bolus. EKG changes were noted and rapid response was activated, dialysis stopped, and blood returned to the patient. Around 6:30 AM the patient was noted to be apneic without a pulse and code blue was called. Patient was noted to have PEA arrest. CPR was done for a few minutes before ROSC was achieved. She was transferred to 26M and patient was intubated due to failure to protect airway.  Pertinent  Medical History  ESRD CHF CVA T2DM Seizures  Significant Hospital Events: Including procedures, antibiotic start and stop dates in addition to other pertinent events   1/23 admit to [facility] for acute infarct + covid/rsv pneumonia 1/30 admit to ICU after patient had PEA arrest  2/1 Failed SBT due to apnea. ABG with respiratory alkalosis. RR decreased 2/2 Tolerating SBT. Remains encephalopathic 2/5 Continues to tolerate SBT during day, but remains encephalopathic/unable to protect airway if extubated  2/9 extubated, off precedex 2/12 still lethargic, ABG 2/11  unremarkable, limit centrally acting meds   Interim History / Subjective:  Alert, oriented to self. Able to follow few commands intermittently.   Objective   Blood pressure (!) 150/68, pulse 85, temperature 99.8 F (37.7 C), temperature source Oral, resp. rate (!) 21, height 5\' 3"  (1.6 m), weight 72 kg, SpO2 100%.        Intake/Output Summary (Last 24 hours) at 12/07/2023 0819 Last data filed at 12/07/2023 0700 Gross per 24 hour  Intake 1184.75 ml  Output 1600 ml  Net -415.25 ml   Filed Weights   12/05/23 0152 12/06/23 0945 12/07/23 0500  Weight: 73.1 kg 72 kg 72 kg    Examination: Gen:      No acute distress. Frail Lungs:    Clear to auscultation bilaterally; normal respiratory effort on [place] CV:         Regular rate and rhythm; no murmurs Abd:      + bowel sounds; soft, non-tender, no distension Ext:    No edema, right BKA Skin:      Warm and dry; no rash Neuro:  Alert and oriented to self, intermittently follows commands   Lab/imaging reviewed Significant for sodium 132, BUN/creatinine 25/2.64, Phos 1.7 WBC up 17.1, hemoglobin 9.3 s/p 1unit PRBC 2/10, platelets 456 CXR showed vascular crowding v pulmonary edema  Resolved Hospital Problem list   Hypokalemia Hypotension  Assessment & Plan:  PEA arrest: due to hypotension, not primarily cardiac in nature  Acute encephalopathy: secondary to post-arrest, stroke, metabolic derangements, and drug induced. Concern for cefepime  toxicity given her hx of ESRD, med was discontinued 2/4. MRI brain did show a new and acute central left cerebellar infarct.  - Opens eyes, oriented in self, intermittently follows commands - Appreciate neurology recs.  Stroke not felt to be significant enough to explain altered mental status - Stopped seroquel and amitriptyline - Limit centrally acting medications as possible - 2/12 CXR with pulmonary edema on HD  Hypertension - Hypotensive initially, but now mildly hypertensive, getting HD  - Home  hydralazine at reduced dose, monitor  - nephrology following   Leukocytosis Sinusitis  - MRI brain noted progressed mastoid effusions - no fever overnight, WBC unchanged 17  - Augmentin BID x 10 days (renal dosed) - trend fever curve  Pneumonia due to COVID and RSV Productive Cough  Received empiric treatment with ceftriaxone and azithro for possible superimposed bacterial pneumonia and also s/p course of solumedrol. Also received 6 days of cefepime post-code- discontinued for improved resp status and possible neurotoxicity.  - Continue with oropharyngeal suction PRN - Guaifenesin Q6H  - Chest PT  Ventilator dependence after PEA arrest, resolved Patient was not ever truly hypoxemic, was intubated for failure to protect airway. Extubated on 2/9. Family had declined tracheostomy and see how she does extubated. Currently on [place] and saturating well.  - Monitor SpO2 - Continue supplemental O2 via [place], wean as tolerated   Acute to early subacute right PICA infarct on 1/24, resolved New acute central left cerebellar infarct  - Appreciate neuro follow up  - Continue DAPT with aspirin and brilinta - Continue statin - Needs 30d heart monitor at discharge  Abnormal C6 signal MRI showed a nonspecific bone marrow signal of c6 vertebral body - unclear if related to a malignancy vs discitis/osteomyelitis. Will hold off on C spine imaging, as family does not want to pursue this workup and priority is mental status/neuro recovery.  - If no other findings, consider C spine MRI to further evaluate  Seizure disorder - Continue home keppra 500 mg BID  ESRD on HD MWF - Dialysis per nephro - Discontinued renvela due to low phos levels  Anemia of chronic disease - 1 unit blood on 2/10, Hgb stable - trend CBC - Aranesp every Friday   Diabetes - Continue SSI - Tube feeds  Goals of Care Had extensive GOC conversation with patient's son at bedside and with husband via phone. We discussed early  tracheostomy to help minimize discomfort and administration of sedation and how if patient is able recover that this would not be a permanent fixture if she is able to rehab after discharge. Husband expresses preference to reach a point to trial off of ventilator before considering tracheostomy. We discussed risk of aspiration due to neurologic status in that scenario. Extubated on 2/9 and will continue to monitor her respiratory and O2 status.    Best Practice (right click and "Reselect all SmartList Selections" daily)   Diet/type: tubefeeds  DVT prophylaxis prophylactic heparin  Pressure ulcer(s): pressure ulcer assessment deferred  GI prophylaxis: H2B Lines: R subclavian HD cath,  Foley:  N/A Code Status:  full code Last date of multidisciplinary goals of care discussion [2/12 updated son at bedside]   Critical care time:

## 2023-12-07 NOTE — Plan of Care (Signed)
  Problem: Respiratory: Goal: Ability to maintain a clear airway and adequate ventilation will improve Outcome: Not Progressing   Pt maintaining airway, but still has very weak cough with labored breathing at rest.

## 2023-12-07 NOTE — Progress Notes (Signed)
Physical Therapy Treatment Patient Details Name: Carolyn Russo MRN: [id number] DOB: 1950-11-14 Today's Date: 12/07/2023   History of Present Illness Pt is a 73 yo female presenting to [facility] on 11/16/23 for chest pain, weakness, and confusion. CT head revealed infarct in the inferior right cerebellum. MRI brain with acute right inferomedial cerebellar ischemic infarction in the right PICA territory. Pt also diagnosed with Covid/RSV. PEA arrest 1/30. Extubated 2/9. PMH: R BKA on 7/26, DM2, HTN, HLD, ESRD on HD MWF, hx of CVA 03/2021 & 05/2022, asthma, CHF, dysphagia, heart murmur, seizure    PT Comments  Remains limited by inability to follow-commands. More alert with bed mobility assistance however incontinent of stool and assisted pt with peri-care. RR increased to 44, respirations with increased wheezing noted, but sats remained 100% on 2L supplemental O2 during session. Required total assist +2. HOB elevated, RR improved. Fell asleep quickly after comfortably repositioning in bed. She was able to automatically move RUE during session, but no to commands, and no appreciable movement noted on Lt side. Will follow but may need to eventually sign off if not showing any signs of functional progress during admission.    If plan is discharge home, recommend the following: Assistance with cooking/housework;Assist for transportation;Help with stairs or ramp for entrance;Two people to help with walking and/or transfers;Two people to help with bathing/dressing/bathroom   Can travel by private vehicle     No  Equipment Recommendations  Other (comment) (defer to post acute)    Recommendations for Other Services       Precautions / Restrictions Precautions Precautions: Fall Recall of Precautions/Restrictions: Impaired Precaution/Restrictions Comments: Monitor O2. Rt CVC Required Braces or Orthoses: Other Brace Other Brace: RLE prosthetic Restrictions Weight Bearing Restrictions Per Provider Order: No      Mobility  Bed Mobility Overal bed mobility: Needs Assistance Bed Mobility: Rolling           General bed mobility comments: Pt incontinent of stool. Required total assist to roll in bed while performing peri-care. Coughed a few times, became more alert, able to sucion secretions. Wheezing and wet sounding respirations became more prononuced; returned to supine with HOB elevated. SpO2 maintained 98-100% on 2L throughout session. RR did increase to 44 while assisting with peri-care. Pt was unable to reach or hold rail with RUE, LLE without any volitional movement.    Transfers                   General transfer comment: defer    Ambulation/Gait                   Stairs             Wheelchair Mobility     Tilt Bed    Modified Rankin (Stroke Patients Only)       Balance                                            Communication Communication Communication: Impaired Factors Affecting Communication: Difficulty expressing self;Reduced clarity of speech  Cognition Arousal: Lethargic Behavior During Therapy: Flat affect   PT - Cognitive impairments: No family/caregiver present to determine baseline, Difficult to assess Difficult to assess due to: Impaired communication, Level of arousal                       Following commands:  Impaired Following commands impaired: Follows one step commands inconsistently    Cueing Cueing Techniques: Verbal cues, Gestural cues, Tactile cues, Visual cues  Exercises      General Comments General comments (skin integrity, edema, etc.): SpO2 100% on 2L during session. HR 101, BP 141/110.      Pertinent Vitals/Pain Pain Assessment Pain Assessment: CPOT Facial Expression: Relaxed, neutral Body Movements: Absence of movements Muscle Tension: Relaxed Compliance with ventilator (intubated pts.): N/A Vocalization (extubated pts.): N/A CPOT Total: 0 Pain Intervention(s): Monitored  during session, Repositioned    Home Living                          Prior Function            PT Goals (current goals can now be found in the care plan section) Acute Rehab PT Goals Patient Stated Goal: return to rehab to improve mobility (Initial evaluation) PT Goal Formulation: Patient unable to participate in goal setting Time For Goal Achievement: 12/18/23 Potential to Achieve Goals: Poor Progress towards PT goals: Not progressing toward goals - comment    Frequency    Min 1X/week      PT Plan      Co-evaluation PT/OT/SLP Co-Evaluation/Treatment: Yes Reason for Co-Treatment: For patient/therapist safety;Complexity of the patient's impairments (multi-system involvement);Necessary to address cognition/behavior during functional activity PT goals addressed during session: Mobility/safety with mobility;Balance OT goals addressed during session: ADL's and self-care;Strengthening/ROM      AM-PAC PT "6 Clicks" Mobility   Outcome Measure  Help needed turning from your back to your side while in a flat bed without using bedrails?: Total Help needed moving from lying on your back to sitting on the side of a flat bed without using bedrails?: Total Help needed moving to and from a bed to a chair (including a wheelchair)?: Total Help needed standing up from a chair using your arms (e.g., wheelchair or bedside chair)?: Total Help needed to walk in hospital room?: Total Help needed climbing 3-5 steps with a railing? : Total 6 Click Score: 6    End of Session   Activity Tolerance: Patient limited by lethargy Patient left: with call bell/phone within reach;in bed;with bed alarm set Nurse Communication: Mobility status;Need for lift equipment;Other (comment) (Stool incontinence, needs new sacral dressing as old one was soiled and removed during peri-care) PT Visit Diagnosis: Muscle weakness (generalized) (M62.81);Difficulty in walking, not elsewhere classified  (R26.2);Other symptoms and signs involving the nervous system (R29.898);Hemiplegia and hemiparesis Hemiplegia - Right/Left: Left     Time: 3086-5784 PT Time Calculation (min) (ACUTE ONLY): 23 min  Charges:    $Therapeutic Activity: 8-22 mins PT General Charges $$ ACUTE PT VISIT: 1 Visit                     Kathlyn Sacramento, PT, DPT [facility]  Rehabilitation Services Physical Therapist Office: [phone] Website: [email or web address]    Berton Mount 12/07/2023, 3:10 PM

## 2023-12-07 NOTE — Progress Notes (Signed)
Nephrology Follow-Up Consult note   Assessment/Recommendations: Carolyn Russo is a/an 73 y.o. female with a past medical history significant for ESRD, admitted for AMS w/ AHRF, CAP, COVID, RSV.   Persistent encephalopathy  Interval History/Subjective:  Lethargic, opens eyes to voice, gestures w/ her hand, not speaking HD tomorrow   Physical Exam: Vitals:   12/01/23 0945 12/01/23 1000  BP:    Pulse: 69 67  Resp: (!) 21 19  Temp:    SpO2: 99% 100%   GEN: Chronically ill-appearing, lying in bed, coretrack in place, did gesture w/ her arm, nonverbal   EYES: no scleral icterus, eyes closed CV: normal rate, no rub PULM: Bilateral chest rise, ventilated ABD: NABS, non-distended SKIN: no rashes or jaundice; R internal jugular TDC exit site c/d EXT: minimal edema, warm and well perfused  Renal-related home meds: - rocaltrol 0.5 mwf - sensipar 30 every day - hydralazine 100 tid - losartan 50 bid - renvela 800mg  ac tid - others: brilinta, crestor, keppra, ritalin, neurontin, asa, PPI, dulera, insulin, albuterol, elavil   Dialysis Orders: MWF NW  4h   B350    75.5kg   2/2 bath   R fem TDC  Heparin 2600 + 1400 midrun - last HD 1/22, getting to dry wt - 3-4 drops in dry wt in last 3 wks - venofer 100 qhd thru 1/31 - mircera 150 q2, last 1/17, due 1/31    CXR 2/04 - no edema   Assessment/Plan: AHRF/ +COVID +RSV pna/ volume^: VDRF started 1/30, extubated 12/03/23 SP course of IV abx. Per CCM.  CVA: R then L cerebellar infarcts d/t small vessel disease. Per neurology.  On  DAPT AMS: multifactorial. Neurology following.  Not much improvement in the past 2 days ESRD: on HD MWF. HD tomorrow.  Hypotension: resolved Volume: BP's labile, somewhat high. 3kg under. UF 1-1.5 L next HD, keep SBP > 105 Anemia of esrd - Hb is down to 8-9 range. Weekly sq darbe qFri. Tsat 26% and ferritin 1638. Pharm rec'd po ferrous sulfate daily.  Transfuse as needed Secondary hyperparathyroidism -  phos in range. Calcium up and down.  Resume binders when taking po H/o seizure d/o - on Keppra PEA arrest  Carolyn Krabbe, MD  12/07/2023, 10:14 AM  Recent Labs  Lab 12/06/23 0351 12/07/23 0219  HGB 9.2* 9.3*  ALBUMIN 2.1* 2.0*  CALCIUM 9.7 9.0  PHOS 2.5 1.7*  CREATININE 3.75* 2.64*  K 4.7 3.9    Inpatient medications:   stroke: early stages of recovery book   Does not apply Once   amoxicillin-clavulanate  500 mg Per Tube Q12H   arformoterol  15 mcg Nebulization BID   aspirin  81 mg Per Tube Daily   budesonide (PULMICORT) nebulizer solution  0.5 mg Nebulization BID   Chlorhexidine Gluconate Cloth  6 each Topical Q0600   darbepoetin (ARANESP) injection - DIALYSIS  150 mcg Subcutaneous Q Fri-1800   ferrous sulfate  300 mg Per Tube Daily   guaiFENesin  5 mL Per Tube Q6H   heparin injection (subcutaneous)  5,000 Units Subcutaneous Q8H   hydrALAZINE  50 mg Per Tube Q8H   insulin aspart  0-15 Units Subcutaneous Q4H   levETIRAcetam  500 mg Per Tube BID   multivitamin  1 tablet Per Tube QHS   nutrition supplement (JUVEN)  1 packet Per Tube BID BM   mouth rinse  15 mL Mouth Rinse Q4H   rosuvastatin  10 mg Per Tube Daily   sodium  chloride flush  3 mL Intravenous Q12H   ticagrelor  90 mg Per Tube BID    anticoagulant sodium citrate     feeding supplement (VITAL 1.5 CAL) 45 mL/hr at 12/07/23 0900   acetaminophen **OR** acetaminophen, anticoagulant sodium citrate, docusate, heparin, ipratropium-albuterol, labetalol, ondansetron (ZOFRAN) IV, mouth rinse, polyethylene glycol, white petrolatum, witch hazel-glycerin

## 2023-12-07 NOTE — Progress Notes (Signed)
Occupational Therapy Re-evaluation Patient Details Name: Carolyn Russo MRN: [id number] DOB: 07/18/1951 Today's Date: 12/07/2023   History of present illness Pt is a 73 yo female presenting to [facility] on 11/16/23 for chest pain, weakness, and confusion. CT head revealed infarct in the inferior right cerebellum. MRI brain with acute right inferomedial cerebellar ischemic infarction in the right PICA territory. Pt also diagnosed with Covid/RSV. PEA arrest 1/30. Extubated 2/9. PMH: R BKA on 7/26, DM2, HTN, HLD, ESRD on HD MWF, hx of CVA 03/2021 & 05/2022, asthma, CHF, dysphagia, heart murmur, seizure   OT comments  Pt seen for OT re-evaluation since ICU transfer and noted functional decline. Pt with eye opening to stimuli though no appreciable command following observed during session with Total A needed for basic ADL completion. Pt with potential L inattention and no active movement of LUE during session. Planned to attempt EOB though pt with bowel incontinence, requiring Total A x 2 for bed mobility and cleanup. Increased RR to 40s noted with peri care though vitals stable throughout. RR normalized once at rest and with HOB elevated.       If plan is discharge home, recommend the following:  A lot of help with bathing/dressing/bathroom;Two people to help with walking and/or transfers;Assistance with cooking/housework;Direct supervision/assist for medications management;Assistance with feeding;Direct supervision/assist for financial management;Assist for transportation;Help with stairs or ramp for entrance   Equipment Recommendations  Hoyer lift;Hospital bed    Recommendations for Other Services      Precautions / Restrictions Precautions Precautions: Fall Precaution/Restrictions Comments: Monitor O2. Rt CVC Required Braces or Orthoses: Other Brace Other Brace: RLE prosthetic Restrictions Weight Bearing Restrictions Per Provider Order: No       Mobility Bed Mobility Overal bed mobility: Needs  Assistance Bed Mobility: Rolling Rolling: Total assist, +2 for safety/equipment, +2 for physical assistance              Transfers                   General transfer comment: defer     Balance                                           ADL either performed or assessed with clinical judgement   ADL Overall ADL's : Needs assistance/impaired     Grooming: Total assistance;Bed level;Wash/dry face Grooming Details (indicate cue type and reason): pt automatically reaching to face and head. placed washcloth in hand, attempted hand over hand to face and also attempted tactile cues with washcloth to face though no pt engagement                     Toileting- Clothing Manipulation and Hygiene: Total assistance;+2 for physical assistance;+2 for safety/equipment;Bed level Toileting - Clothing Manipulation Details (indicate cue type and reason): profuse bowel incontinence bed level - required extensive time and assist to clean. removed sacral pad d/t soilage - notified nursing though no available assist at the time            Extremity/Trunk Assessment Upper Extremity Assessment Upper Extremity Assessment: Right hand dominant;RUE deficits/detail;LUE deficits/detail RUE Deficits / Details: automatic movements noted to lift to face, top of head but did not follow commands to engage in meaningful tasks LUE Deficits / Details: no active movement noted. PROM [facility]   Lower Extremity Assessment Lower Extremity Assessment: Defer to PT evaluation  Vision   Vision Assessment?: Vision impaired- to be further tested in functional context Additional Comments: appeared to   Perception     Praxis     Communication Communication Communication: Impaired Factors Affecting Communication: Difficulty expressing self;Reduced clarity of speech   Cognition Arousal: Lethargic Behavior During Therapy: Flat affect Cognition: Difficult to assess Difficult to  assess due to: Impaired communication, Level of arousal           OT - Cognition Comments: did open eyes with verbal/tactile stimulation, no appreciable command following to engage in tasks. did verbalize "yes" once and attempted to mouth another word but unintelligble                 Following commands: Impaired Following commands impaired: Follows one step commands inconsistently      Cueing   Cueing Techniques: Verbal cues, Gestural cues, Tactile cues, Visual cues  Exercises      Shoulder Instructions       General Comments      Pertinent Vitals/ Pain       Pain Assessment Pain Assessment: Faces Faces Pain Scale: No hurt  Home Living                                          Prior Functioning/Environment              Frequency  Min 1X/week        Progress Toward Goals  OT Goals(current goals can now be found in the care plan section)  Progress towards OT goals: OT to reassess next treatment  Acute Rehab OT Goals Patient Stated Goal: none stated OT Goal Formulation: Patient unable to participate in goal setting Time For Goal Achievement: 12/21/23 Potential to Achieve Goals: Fair  Plan      Co-evaluation    PT/OT/SLP Co-Evaluation/Treatment: Yes Reason for Co-Treatment: For patient/therapist safety;Complexity of the patient's impairments (multi-system involvement);Necessary to address cognition/behavior during functional activity   OT goals addressed during session: ADL's and self-care;Strengthening/ROM      AM-PAC OT "6 Clicks" Daily Activity     Outcome Measure   Help from another person eating meals?: Total Help from another person taking care of personal grooming?: Total Help from another person toileting, which includes using toliet, bedpan, or urinal?: Total Help from another person bathing (including washing, rinsing, drying)?: Total Help from another person to put on and taking off regular upper body clothing?:  Total Help from another person to put on and taking off regular lower body clothing?: Total 6 Click Score: 6    End of Session Equipment Utilized During Treatment: Oxygen  OT Visit Diagnosis: Muscle weakness (generalized) (M62.81);Other symptoms and signs involving cognitive function;Pain;Other abnormalities of gait and mobility (R26.89);Unsteadiness on feet (R26.81);Other (comment) Hemiplegia - Right/Left: Left Hemiplegia - dominant/non-dominant: Non-Dominant Hemiplegia - caused by: Cerebral infarction   Activity Tolerance Patient limited by fatigue   Patient Left in bed;with call bell/phone within reach   Nurse Communication          Time: 4098-1191 OT Time Calculation (min): 25 min  Charges: OT General Charges $OT Visit: 1 Visit OT Evaluation $OT Re-eval: 1 Re-eval  Bradd Canary, OTR/L Acute Rehab Services Office: [phone]   Lorre Munroe 12/07/2023, 2:39 PM

## 2023-12-08 DIAGNOSIS — G9341 Metabolic encephalopathy: Secondary | ICD-10-CM | POA: Diagnosis not present

## 2023-12-08 DIAGNOSIS — U071 COVID-19: Secondary | ICD-10-CM | POA: Diagnosis not present

## 2023-12-08 DIAGNOSIS — J9601 Acute respiratory failure with hypoxia: Secondary | ICD-10-CM | POA: Diagnosis not present

## 2023-12-08 LAB — CBC
HCT: 28 % — ABNORMAL LOW (ref 36.0–46.0)
Hemoglobin: 9.6 g/dL — ABNORMAL LOW (ref 12.0–15.0)
MCH: 30 pg (ref 26.0–34.0)
MCHC: 34.3 g/dL (ref 30.0–36.0)
MCV: 87.5 fL (ref 80.0–100.0)
Platelets: 473 10*3/uL — ABNORMAL HIGH (ref 150–400)
RBC: 3.2 MIL/uL — ABNORMAL LOW (ref 3.87–5.11)
RDW: 16.5 % — ABNORMAL HIGH (ref 11.5–15.5)
WBC: 16.3 10*3/uL — ABNORMAL HIGH (ref 4.0–10.5)
nRBC: 0.1 % (ref 0.0–0.2)

## 2023-12-08 LAB — GLUCOSE, CAPILLARY
Glucose-Capillary: 202 mg/dL — ABNORMAL HIGH (ref 70–99)
Glucose-Capillary: 207 mg/dL — ABNORMAL HIGH (ref 70–99)
Glucose-Capillary: 227 mg/dL — ABNORMAL HIGH (ref 70–99)
Glucose-Capillary: 145 mg/dL — ABNORMAL HIGH (ref 70–99)
Glucose-Capillary: 205 mg/dL — ABNORMAL HIGH (ref 70–99)
Glucose-Capillary: 186 mg/dL — ABNORMAL HIGH (ref 70–99)

## 2023-12-08 LAB — RENAL FUNCTION PANEL
Albumin: 2.1 g/dL — ABNORMAL LOW (ref 3.5–5.0)
Anion gap: 13 (ref 5–15)
BUN: 55 mg/dL — ABNORMAL HIGH (ref 8–23)
CO2: 27 mmol/L (ref 22–32)
Calcium: 9.1 mg/dL (ref 8.9–10.3)
Chloride: 91 mmol/L — ABNORMAL LOW (ref 98–111)
Creatinine, Ser: 3.75 mg/dL — ABNORMAL HIGH (ref 0.44–1.00)
GFR, Estimated: 12 mL/min — ABNORMAL LOW (ref >60)
Glucose, Bld: 205 mg/dL — ABNORMAL HIGH (ref 70–99)
Phosphorus: 2.3 mg/dL — ABNORMAL LOW (ref 2.5–4.6)
Potassium: 4.5 mmol/L (ref 3.5–5.1)
Sodium: 131 mmol/L — ABNORMAL LOW (ref 135–145)

## 2023-12-08 MED ORDER — LIDOCAINE-PRILOCAINE 2.5-2.5 % EX CREA: 1.0000 | TOPICAL_CREAM | CUTANEOUS | Status: DC | PRN

## 2023-12-08 MED ORDER — NEPRO/CARBSTEADY PO LIQD: 237.0000 mL | ORAL | Status: DC | PRN

## 2023-12-08 MED ORDER — HEPARIN SODIUM (PORCINE) 1000 UNIT/ML DIALYSIS
1000.0000 [IU] | INTRAMUSCULAR | Status: DC | PRN
  Administered 2023-12-08 – 2023-12-12 (×2): 4200 [IU]
  Administered 2023-12-14: 1000 [IU]
  Filled 2023-12-08 (×5): qty 1

## 2023-12-08 MED ORDER — LIDOCAINE HCL (PF) 1 % IJ SOLN: 5.0000 mL | INTRAMUSCULAR | Status: DC | PRN

## 2023-12-08 MED ORDER — ANTICOAGULANT SODIUM CITRATE 4% (200MG/5ML) IV SOLN: 5.0000 mL | Status: DC | PRN

## 2023-12-08 MED ORDER — HEPARIN SODIUM (PORCINE) 1000 UNIT/ML DIALYSIS
1500.0000 [IU] | INTRAMUSCULAR | Status: AC | PRN
  Administered 2023-12-12: 2500 [IU] via INTRAVENOUS_CENTRAL
  Filled 2023-12-08: qty 2

## 2023-12-08 MED ORDER — HEPARIN SODIUM (PORCINE) 1000 UNIT/ML IJ SOLN
2500.0000 [IU] | Freq: Once | INTRAMUSCULAR | Status: AC
  Administered 2023-12-08: 2500 [IU] via INTRAVENOUS

## 2023-12-08 MED ORDER — PENTAFLUOROPROP-TETRAFLUOROETH EX AERO: 1.0000 | INHALATION_SPRAY | CUTANEOUS | Status: DC | PRN

## 2023-12-08 MED ORDER — INSULIN GLARGINE-YFGN 100 UNIT/ML ~~LOC~~ SOLN
5.0000 [IU] | Freq: Every day | SUBCUTANEOUS | Status: DC
  Administered 2023-12-08 – 2023-12-18 (×11): 5 [IU] via SUBCUTANEOUS
  Filled 2023-12-08 (×12): qty 0.05

## 2023-12-08 MED ORDER — HEPARIN SODIUM (PORCINE) 1000 UNIT/ML DIALYSIS
2500.0000 [IU] | Freq: Once | INTRAMUSCULAR | Status: DC
  Filled 2023-12-08 (×2): qty 3

## 2023-12-08 MED ORDER — ALTEPLASE 2 MG IJ SOLR: 2.0000 mg | Freq: Once | INTRAMUSCULAR | Status: DC | PRN

## 2023-12-08 NOTE — TOC Progression Note (Addendum)
Transition of Care [facility]) - Progression Note    Patient Details  Name: Carolyn Russo MRN: [id number] Date of Birth: 23-Dec-1950  Transition of Care [facility]) CM/SW Contact  Marliss Coots, LCSW Phone Number: 12/08/2023, 2:35 PM  Clinical Narrative:     2:35 PM Per progressions and medical team, patient may not qualify for CIR but family expressed interest palliative care consult and LTACH.   Expected Discharge Plan: Skilled Nursing Facility    Expected Discharge Plan and Services In-house Referral: Clinical Social Work     Living arrangements for the past 2 months: Single Family Home, Skilled Nursing Facility                                       Social Determinants of Health (SDOH) Interventions SDOH Screenings   Food Insecurity: No Food Insecurity (11/17/2023)  Housing: Low Risk  (11/17/2023)  Transportation Needs: No Transportation Needs (11/17/2023)  Utilities: Not At Risk (11/17/2023)  Alcohol Screen: Low Risk  (01/09/2019)  Depression (PHQ2-9): Low Risk  (09/11/2023)  Financial Resource Strain: Low Risk  (09/02/2021)  Physical Activity: Inactive (09/02/2021)  Social Connections: Socially Integrated (11/17/2023)  Stress: Stress Concern Present (09/02/2021)  Tobacco Use: Low Risk  (11/16/2023)    Readmission Risk Interventions    02/14/2023    4:54 PM  Readmission Risk Prevention Plan  Transportation Screening Complete  PCP or Specialist Appt within 3-5 Days Complete  HRI or Home Care Consult Complete  Social Work Consult for Recovery Care Planning/Counseling Complete  Palliative Care Screening Complete  Medication Review Oceanographer) Complete

## 2023-12-08 NOTE — TOC Progression Note (Signed)
Transition of Care [facility]) - Progression Note    Patient Details  Name: Carolyn Russo MRN: [id number] Date of Birth: 10-Feb-1951  Transition of Care [facility]) CM/SW Contact  Harriet Masson, RN Phone Number: 12/08/2023, 3:52 PM  Clinical Narrative:     Left VM with spouse, Channing Mutters, requesting return call.  Expected Discharge Plan: Skilled Nursing Facility    Expected Discharge Plan and Services In-house Referral: Clinical Social Work     Living arrangements for the past 2 months: Single Family Home, Skilled Nursing Facility                                       Social Determinants of Health (SDOH) Interventions SDOH Screenings   Food Insecurity: No Food Insecurity (11/17/2023)  Housing: Low Risk  (11/17/2023)  Transportation Needs: No Transportation Needs (11/17/2023)  Utilities: Not At Risk (11/17/2023)  Alcohol Screen: Low Risk  (01/09/2019)  Depression (PHQ2-9): Low Risk  (09/11/2023)  Financial Resource Strain: Low Risk  (09/02/2021)  Physical Activity: Inactive (09/02/2021)  Social Connections: Socially Integrated (11/17/2023)  Stress: Stress Concern Present (09/02/2021)  Tobacco Use: Low Risk  (11/16/2023)    Readmission Risk Interventions    02/14/2023    4:54 PM  Readmission Risk Prevention Plan  Transportation Screening Complete  PCP or Specialist Appt within 3-5 Days Complete  HRI or Home Care Consult Complete  Social Work Consult for Recovery Care Planning/Counseling Complete  Palliative Care Screening Complete  Medication Review Oceanographer) Complete

## 2023-12-08 NOTE — Plan of Care (Signed)
Problem: Coping: Goal: Ability to adjust to condition or change in health will improve Outcome: Progressing   Problem: Fluid Volume: Goal: Ability to maintain a balanced intake and output will improve Outcome: Progressing

## 2023-12-08 NOTE — Progress Notes (Signed)
Nutrition Follow-up  DOCUMENTATION CODES:  Not applicable  INTERVENTION:  Continue TF via cortrak: Vital 1.5 at 45 ml/h (1080 ml per day) Provides 1620 kcal, 73 gm protein, 825 ml free water daily Juven BID, each packet provides 95 calories, 2.5 grams of protein (collagen), and micronutrients to support wound healing Renal MVI daily  NUTRITION DIAGNOSIS:  Inadequate oral intake related to acute illness as evidenced by NPO status. - remains applicable   GOAL:  Patient will meet greater than or equal to 90% of their needs - progressing, being met with TF at goal  MONITOR:  TF tolerance, I & O's, Labs, Weight trends  REASON FOR ASSESSMENT:  Ventilator    ASSESSMENT:  Pt  presented 1/23 with confusion and worsening L chest pain secondary to COVID and RSV infection. PMH significant for ESRD on HD, CHF, CVA with residual L sided weakness, T2DM, R BKA, seizure disorder.  1/23: MRI brain- revealed 1.3 cm acute to early subacute R PICA distribution infarct 1/28: MBS recommend dysphagia 2, thin liquids 1/30: PEA arrest; intubated; NPO  2/4: MRI brain- resolution of small R PICA cerebellar infarct, new/acute central L cerebellar infarct with no associated hemorrhage   2/9 - extubated  Pt resting in bed at the time of assessment. No visitors present at this time. Pt seems restless and is sitting up. Cortrak in place and TF infusing at goal.   Discussed in rounds, pt to be transferred out of ICU soon and TOC working on placement, SNF versus LTACH  Weight down from admission, but stable for ~10 days. Will continue current TF regimen.  Admit weight: 79.5 kg   Current weight: 72 kg EDW prior to admission: 75.5 kg (nephrology notes several drops in dry wt over the last 3 weeks)   Intake/Output Summary (Last 24 hours) at 12/08/2023 1247 Last data filed at 12/08/2023 0700 Gross per 24 hour  Intake 945 ml  Output 0 ml  Net 945 ml  Net IO Since Admission: 5,155.31 mL [12/08/23  1247]  Drains/Lines: Cortrak (distal stomach) Right AV fistula  Average Meal Intake: 1/29: 18% intake x 2 recorded meals  Nutritionally Relevant Medications: Scheduled Meds:  amoxicillin-clavulanate  500 mg Per Tube Q12H   ferrous sulfate  300 mg Per Tube Daily   insulin aspart  0-15 Units Subcutaneous Q4H   levETIRAcetam  500 mg Per Tube BID   multivitamin  1 tablet Per Tube QHS   JUVEN  1 packet Per Tube BID BM   rosuvastatin  10 mg Per Tube Daily   ticagrelor  90 mg Per Tube BID   Continuous Infusions:  anticoagulant sodium citrate     feeding supplement (VITAL 1.5 CAL) 45 mL/hr at 12/08/23 0700   PRN Meds: docusate, ondansetron, polyethylene glycol  Labs Reviewed: Na 131, chloride 91 BUN 55, Creatinine 3.75 Phosphorus 2.3 CBG ranges from 168-234 mg/dL over the last 24 hours HgbA1c 5.8%  NUTRITION - FOCUSED PHYSICAL EXAM: Flowsheet Row Most Recent Value  Orbital Region No depletion  Upper Arm Region No depletion  Thoracic and Lumbar Region Unable to assess  Buccal Region Unable to assess  Temple Region No depletion  Clavicle Bone Region Mild depletion  Clavicle and Acromion Bone Region No depletion  Scapular Bone Region Mild depletion  Dorsal Hand Unable to assess  Patellar Region Moderate depletion  Anterior Thigh Region Moderate depletion  [R BKA]  Posterior Calf Region Moderate depletion  Edema (RD Assessment) None  Hair Reviewed  Eyes Reviewed  Mouth Unable  to assess  Skin Reviewed  Nails Unable to assess    Diet Order:   Diet Order             Diet NPO time specified  Diet effective now                  EDUCATION NEEDS:  No education needs have been identified at this time  Skin:  Skin Assessment: Reviewed RN Assessment Stage 2: - Medial Sacrum  Last BM:  2/13 - type 7  Height:  Ht Readings from Last 1 Encounters:  11/28/23 5\' 3"  (1.6 m)    Weight:  Wt Readings from Last 1 Encounters:  12/08/23 72 kg    Ideal Body Weight:   52.3 kg  BMI:  Body mass index is 28.12 kg/m.  Estimated Nutritional Needs:  Kcal:  1600-1800 kcal/d Protein:  80-100 g/d Fluid:  1L + UOP    Greig Castilla, RD, LDN Registered Dietitian II Please reach out via secure chat Weekend on-call pager # available in [facility]

## 2023-12-08 NOTE — Progress Notes (Signed)
Nephrology Follow-Up Consult note   Assessment/Recommendations: Carolyn Russo is a/an 73 y.o. female with a past medical history significant for ESRD, admitted for AMS w/ AHRF, CAP, COVID, RSV.   Persistent encephalopathy  Interval History/Subjective:  Husband at bedside - pt showing more signs of interacting, spoke to relatives asking questions about grand kids HD today   Physical Exam: Vitals:   12/01/23 0945 12/01/23 1000  BP:    Pulse: 69 67  Resp: (!) 21 19  Temp:    SpO2: 99% 100%   GEN: Chronically ill-appearing, lying in bed, coretrack in place EYES: no scleral icterus CV: normal rate, no rub PULM: Bilateral chest rise, ventilated ABD: NABS, non-distended SKIN: no rashes or jaundice; R fem TDC exit site c/d EXT: no edema, warm and well perfused  Renal-related home meds: - rocaltrol 0.5 mwf - sensipar 30 every day - hydralazine 100 tid - losartan 50 bid - renvela 800mg  ac tid - others: brilinta, crestor, keppra, ritalin, neurontin, asa, PPI, dulera, insulin, albuterol, elavil   Dialysis Orders: MWF [facility]  4h   B350    75.5kg   2/2 bath   R fem TDC  Heparin 2600 + 1400 midrun - last HD 1/22, getting to dry wt - venofer 100 qhd thru 1/31 - mircera 150 q2, last 1/17, due 1/31    CXR 2/04 - no edema   Assessment/Plan: AHRF/ +COVID +RSV pna/ volume^: VDRF on 1/30, extubated 12/03/23 SP course of IV abx and HD. Per CCM.  CVA: R then L cerebellar infarcts d/t small vessel disease. Per neurology.  On  DAPT AMS: multifactorial. Neurology following. Has been improving recently.  ESRD: on HD MWF. HD today.  Hypotension: resolved Volume: BP's labile, 3kg under, no edema. UF 1-1.5 L w/ HD, keep SBP > 105 Anemia of esrd - Hb is down to 8-9 range. Weekly sq darbe qFri. Tsat 26% and ferritin 1638. Pharm rec'd po ferrous sulfate daily.  Transfuse as needed Secondary hyperparathyroidism - phos in range. Calcium up when corrected, not on vdra.  Resume binders when taking  po H/o seizure d/o - on Keppra PEA arrest  [name], MD  12/08/2023, 10:38 AM  Recent Labs  Lab 12/07/23 0219 12/08/23 0352  HGB 9.3* 9.6*  ALBUMIN 2.0* 2.1*  CALCIUM 9.0 9.1  PHOS 1.7* 2.3*  CREATININE 2.64* 3.75*  K 3.9 4.5    Inpatient medications:   stroke: early stages of recovery book   Does not apply Once   amoxicillin-clavulanate  500 mg Per Tube Q12H   arformoterol  15 mcg Nebulization BID   aspirin  81 mg Per Tube Daily   budesonide (PULMICORT) nebulizer solution  0.5 mg Nebulization BID   Chlorhexidine Gluconate Cloth  6 each Topical Q0600   Chlorhexidine Gluconate Cloth  6 each Topical Q0600   darbepoetin (ARANESP) injection - DIALYSIS  150 mcg Subcutaneous Q Fri-1800   ferrous sulfate  300 mg Per Tube Daily   guaiFENesin  5 mL Per Tube Q6H   heparin injection (subcutaneous)  5,000 Units Subcutaneous Q8H   hydrALAZINE  50 mg Per Tube Q8H   insulin aspart  0-15 Units Subcutaneous Q4H   insulin glargine-yfgn  5 Units Subcutaneous Daily   levETIRAcetam  500 mg Per Tube BID   multivitamin  1 tablet Per Tube QHS   nutrition supplement (JUVEN)  1 packet Per Tube BID BM   mouth rinse  15 mL Mouth Rinse Q4H   rosuvastatin  10 mg  Per Tube Daily   sodium chloride flush  3 mL Intravenous Q12H   ticagrelor  90 mg Per Tube BID    anticoagulant sodium citrate     feeding supplement (VITAL 1.5 CAL) 45 mL/hr at 12/08/23 0700   acetaminophen **OR** acetaminophen, anticoagulant sodium citrate, docusate, heparin, ipratropium-albuterol, labetalol, ondansetron (ZOFRAN) IV, mouth rinse, polyethylene glycol, white petrolatum, witch hazel-glycerin

## 2023-12-08 NOTE — Progress Notes (Signed)
Report called to [facility]

## 2023-12-08 NOTE — Progress Notes (Signed)
Patient favors the R/Side, despite multiple attempts to reposition, she leans and wiggles towards the right.

## 2023-12-08 NOTE — Progress Notes (Signed)
NAME:  Carolyn Russo, MRN:  [id number], DOB:  1950-10-28, LOS: 22 ADMISSION DATE:  11/16/2023, CONSULTATION DATE:  1/30 REFERRING MD:  TRH, CHIEF COMPLAINT:  code blue   History of Present Illness:  Carolyn Russo is a 73 yo female with ESRD on HD MWF, CHF, previous CVA with residual left sided weakness, T2DM, right BKA, and seizure disorder. She presented to [facility] on 1/23 with confusion, left sided chest pain, shortness of breath, and coughing. She was found to be hypoxic on room air and placed on 2 L [place]. CT chest was done without evidence of PE.  MRI revealed a 1.3cm acute to early subacute right PICA infarct. Also was admitted with pneumonia due to COVID and RSV and was started on antibiotics for likely superimposed bacterial infection.    On 1/30 early morning the patient was ~2 hrs into dialysis when her blood pressure dropped. UF goal was decreased but since the patient's level of consciousnesses decreased, she received 300 cc NS bolus. EKG changes were noted and rapid response was activated, dialysis stopped, and blood returned to the patient. Around 6:30 AM the patient was noted to be apneic without a pulse and code blue was called. Patient was noted to have PEA arrest. CPR was done for a few minutes before ROSC was achieved. She was transferred to 10M and patient was intubated due to failure to protect airway.  Pertinent  Medical History  ESRD CHF CVA T2DM Seizures  Significant Hospital Events: Including procedures, antibiotic start and stop dates in addition to other pertinent events   1/23 admit to [facility] for acute infarct + covid/rsv pneumonia 1/30 admit to ICU after patient had PEA arrest  2/1 Failed SBT due to apnea. ABG with respiratory alkalosis. RR decreased 2/2 Tolerating SBT. Remains encephalopathic 2/5 Continues to tolerate SBT during day, but remains encephalopathic/unable to protect airway if extubated  2/9 extubated, off precedex 2/12 still lethargic, ABG 2/11  unremarkable, limit centrally acting meds  Interim History / Subjective:  Awakens to voice and at times follow commands. More alert later in morning.   Objective   Blood pressure (!) 141/56, pulse 86, temperature 98.6 F (37 C), temperature source Axillary, resp. rate (!) 25, height 5\' 3"  (1.6 m), weight 72 kg, SpO2 100%.        Intake/Output Summary (Last 24 hours) at 12/08/2023 0630 Last data filed at 12/07/2023 2300 Gross per 24 hour  Intake 1035 ml  Output 0 ml  Net 1035 ml   Filed Weights   12/06/23 0945 12/07/23 0500 12/08/23 0500  Weight: 72 kg 72 kg 72 kg    Examination: Gen:      No acute distress. Frail Lungs:    Clear to auscultation bilaterally; normal respiratory effort on [place] CV:         Regular rate and rhythm; no murmurs Abd:      + bowel sounds; soft, non-tender, no distension Ext:    No edema, right BKA Skin:      Warm and dry; no rash Neuro:  Awakens to voice, intermittently follows commands  Lab/imaging reviewed Significant for sodium 131, BUN/creatinine 55/3.75, Phos 2.3 WBC down 16.3, hemoglobin 9.6 s/p 1unit PRBC 2/10, platelets 473 CXR showed vascular crowding v pulmonary edema  Resolved Hospital Problem list   Hypokalemia Hypotension  Assessment & Plan:  PEA arrest: due to hypotension, not primarily cardiac in nature  Acute encephalopathy: secondary to post-arrest, stroke, metabolic derangements, and drug induced. Concern for cefepime toxicity  given her hx of ESRD, med was discontinued 2/4. MRI brain did show a new and acute central left cerebellar infarct.  - Appreciate neurology recs.  Stroke not felt to be significant enough to explain altered mental status - Stopped seroquel and amitriptyline - Limit centrally acting medications as possible - Last ABG without hypercarbia  - 2/12 CXR with pulmonary edema on HD  Hypertension - Hypotensive initially, but now mildly hypertensive, getting HD  - Home hydralazine at reduced dose, monitor  -  nephrology following   Leukocytosis Sinusitis  - MRI brain noted progressed mastoid effusions - no fever overnight, WBC trending down - Augmentin BID x 10 days (renal dosed) - trend fever curve  Pneumonia due to COVID and RSV Productive Cough  Received empiric treatment with ceftriaxone and azithro for possible superimposed bacterial pneumonia and also s/p course of solumedrol. Also received 6 days of cefepime post-code- discontinued for improved resp status and possible neurotoxicity.  - Continue with NT suction PRN - Guaifenesin Q6H  - Chest PT  Ventilator dependence after PEA arrest, resolved Patient was not ever truly hypoxemic, was intubated for failure to protect airway. Extubated on 2/9. Family had declined tracheostomy and see how she does extubated. Currently on [place] and saturating well.  - Monitor SpO2 - Continue supplemental O2 via [place], wean as tolerated   Acute to early subacute right PICA infarct on 1/24, resolved New acute central left cerebellar infarct  - Appreciate neuro follow up  - Continue DAPT with aspirin and brilinta - Continue statin - Needs 30d heart monitor at discharge  Abnormal C6 signal MRI showed a nonspecific bone marrow signal of c6 vertebral body - unclear if related to a malignancy vs discitis/osteomyelitis. Will hold off on C spine imaging, as family does not want to pursue this workup and priority is mental status/neuro recovery.  - If no other findings, consider C spine MRI to further evaluate  Seizure disorder - Continue home keppra 500 mg BID  ESRD on HD MWF - Dialysis per nephro - Discontinued renvela due to low phos levels  Anemia of chronic disease - 1 unit blood on 2/10, Hgb stable - trend CBC - Aranesp every Friday   Diabetes - Continue SSI - Tube feeds  Goals of Care Had extensive GOC conversation with patient's son at bedside and with husband via phone. We discussed early tracheostomy to help minimize discomfort and  administration of sedation and how if patient is able recover that this would not be a permanent fixture if she is able to rehab after discharge. Husband expresses preference to reach a point to trial off of ventilator before considering tracheostomy. We discussed risk of aspiration due to neurologic status in that scenario. Extubated on 2/9 and will continue to monitor her respiratory and O2 status.  - updated patient's husband today - continue full scope of care and full code - husband agreeable for PMT consult and possible consideration of LTAC   Best Practice (right click and "Reselect all SmartList Selections" daily)   Diet/type: tubefeeds  DVT prophylaxis prophylactic heparin  Pressure ulcer(s): pressure ulcer assessment deferred  GI prophylaxis: H2B Lines: R subclavian HD cath,  Foley:  N/A Code Status:  full code Last date of multidisciplinary goals of care discussion [updated husband by phone today]   Critical care time:

## 2023-12-08 NOTE — Progress Notes (Signed)
   12/08/23 1500  Vitals  BP (!) 172/87  MAP (mmHg) 113  Pulse Rate 91  ECG Heart Rate 90  Resp (!) 36  Oxygen Therapy  SpO2 97 %  MEWS Score  MEWS Temp 0  MEWS Systolic 0  MEWS Pulse 0  MEWS RR 3  MEWS LOC 1  MEWS Score 4  MEWS Score Color Red     HTN not treated because she is in the middle of HD treatment.

## 2023-12-08 NOTE — Procedures (Signed)
Received patient at bedside. Alert. Informed consent signed and in chart.   TX duration: 3.25 hours  Patient tolerated well.  Transported back to the room  Alert, without acute distress.  Hand-off given to patient's nurse.   Access used: right subclavian Access issues: none  Total UF removed: 1.5 liters Medication(s) given: none  Lu Duffel, RN [facility]

## 2023-12-09 ENCOUNTER — Inpatient Hospital Stay (HOSPITAL_COMMUNITY): Payer: Medicare PPO

## 2023-12-09 DIAGNOSIS — I4891 Unspecified atrial fibrillation: Secondary | ICD-10-CM | POA: Diagnosis not present

## 2023-12-09 DIAGNOSIS — G9340 Encephalopathy, unspecified: Secondary | ICD-10-CM | POA: Diagnosis not present

## 2023-12-09 DIAGNOSIS — U071 COVID-19: Secondary | ICD-10-CM | POA: Diagnosis not present

## 2023-12-09 DIAGNOSIS — J9601 Acute respiratory failure with hypoxia: Secondary | ICD-10-CM | POA: Diagnosis not present

## 2023-12-09 DIAGNOSIS — G9341 Metabolic encephalopathy: Secondary | ICD-10-CM | POA: Diagnosis not present

## 2023-12-09 DIAGNOSIS — L8990 Pressure ulcer of unspecified site, unspecified stage: Secondary | ICD-10-CM | POA: Insufficient documentation

## 2023-12-09 LAB — CBC
HCT: 27.4 % — ABNORMAL LOW (ref 36.0–46.0)
Hemoglobin: 9.2 g/dL — ABNORMAL LOW (ref 12.0–15.0)
MCH: 30 pg (ref 26.0–34.0)
MCHC: 33.6 g/dL (ref 30.0–36.0)
MCV: 89.3 fL (ref 80.0–100.0)
Platelets: 472 10*3/uL — ABNORMAL HIGH (ref 150–400)
RBC: 3.07 MIL/uL — ABNORMAL LOW (ref 3.87–5.11)
RDW: 16.8 % — ABNORMAL HIGH (ref 11.5–15.5)
WBC: 15.2 10*3/uL — ABNORMAL HIGH (ref 4.0–10.5)
nRBC: 0.1 % (ref 0.0–0.2)

## 2023-12-09 LAB — GLUCOSE, CAPILLARY
Glucose-Capillary: 126 mg/dL — ABNORMAL HIGH (ref 70–99)
Glucose-Capillary: 137 mg/dL — ABNORMAL HIGH (ref 70–99)
Glucose-Capillary: 142 mg/dL — ABNORMAL HIGH (ref 70–99)
Glucose-Capillary: 142 mg/dL — ABNORMAL HIGH (ref 70–99)
Glucose-Capillary: 180 mg/dL — ABNORMAL HIGH (ref 70–99)
Glucose-Capillary: 191 mg/dL — ABNORMAL HIGH (ref 70–99)
Glucose-Capillary: 203 mg/dL — ABNORMAL HIGH (ref 70–99)

## 2023-12-09 LAB — RENAL FUNCTION PANEL
Albumin: 2.1 g/dL — ABNORMAL LOW (ref 3.5–5.0)
Anion gap: 12 (ref 5–15)
BUN: 31 mg/dL — ABNORMAL HIGH (ref 8–23)
CO2: 25 mmol/L (ref 22–32)
Calcium: 8.9 mg/dL (ref 8.9–10.3)
Chloride: 94 mmol/L — ABNORMAL LOW (ref 98–111)
Creatinine, Ser: 2.76 mg/dL — ABNORMAL HIGH (ref 0.44–1.00)
GFR, Estimated: 17 mL/min — ABNORMAL LOW (ref >60)
Glucose, Bld: 201 mg/dL — ABNORMAL HIGH (ref 70–99)
Phosphorus: 2 mg/dL — ABNORMAL LOW (ref 2.5–4.6)
Potassium: 4.4 mmol/L (ref 3.5–5.1)
Sodium: 131 mmol/L — ABNORMAL LOW (ref 135–145)

## 2023-12-09 LAB — MAGNESIUM: Magnesium: 2 mg/dL (ref 1.7–2.4)

## 2023-12-09 MED ORDER — LACTATED RINGERS IV BOLUS
500.0000 mL | Freq: Once | INTRAVENOUS | Status: AC
  Administered 2023-12-09: 500 mL via INTRAVENOUS

## 2023-12-09 MED ORDER — SODIUM CHLORIDE 0.9 % IV SOLN
3.0000 g | Freq: Two times a day (BID) | INTRAVENOUS | Status: DC
  Administered 2023-12-09: 3 g via INTRAVENOUS
  Filled 2023-12-09: qty 8

## 2023-12-09 MED ORDER — DILTIAZEM HCL 25 MG/5ML IV SOLN
10.0000 mg | Freq: Four times a day (QID) | INTRAVENOUS | Status: DC | PRN
  Administered 2023-12-09: 10 mg via INTRAVENOUS
  Filled 2023-12-09: qty 5

## 2023-12-09 MED ORDER — DILTIAZEM HCL-DEXTROSE 125-5 MG/125ML-% IV SOLN (PREMIX)
5.0000 mg/h | INTRAVENOUS | Status: DC
  Administered 2023-12-09: 5 mg/h via INTRAVENOUS
  Filled 2023-12-09: qty 125

## 2023-12-09 MED ORDER — AMIODARONE LOAD VIA INFUSION
150.0000 mg | Freq: Once | INTRAVENOUS | Status: AC
  Administered 2023-12-09: 150 mg via INTRAVENOUS
  Filled 2023-12-09: qty 83.34

## 2023-12-09 MED ORDER — AMIODARONE HCL IN DEXTROSE 360-4.14 MG/200ML-% IV SOLN
60.0000 mg/h | INTRAVENOUS | Status: AC
  Administered 2023-12-09 (×2): 60 mg/h via INTRAVENOUS
  Filled 2023-12-09 (×2): qty 200

## 2023-12-09 MED ORDER — AMIODARONE HCL IN DEXTROSE 360-4.14 MG/200ML-% IV SOLN
30.0000 mg/h | INTRAVENOUS | Status: AC
Start: 2023-12-09 — End: 2023-12-10
  Administered 2023-12-09 – 2023-12-10 (×2): 30 mg/h via INTRAVENOUS
  Filled 2023-12-09: qty 200

## 2023-12-09 NOTE — Plan of Care (Signed)
  Problem: Metabolic: Goal: Ability to maintain appropriate glucose levels will improve Outcome: Progressing   Problem: Coping: Goal: Ability to adjust to condition or change in health will improve Outcome: Not Progressing   Problem: Fluid Volume: Goal: Ability to maintain a balanced intake and output will improve Outcome: Not Progressing   Problem: Nutritional: Goal: Maintenance of adequate nutrition will improve Outcome: Not Progressing

## 2023-12-09 NOTE — Progress Notes (Signed)
 PROGRESS NOTE                                                                                                                                                                                                             Patient Demographics:    Carolyn Russo, is a 73 y.o. female, DOB - 06/20/1951, RUE:[id number]  Outpatient Primary MD for the patient is Annita Brod, MD    LOS - 23  Admit date - 11/16/2023    Chief Complaint  Patient presents with   Chest Pain   Dialysis pt       Brief Narrative (HPI from H&P)   73 yo female with ESRD on HD MWF, CHF, previous CVA with residual left sided weakness, T2DM, right BKA, and seizure disorder. She presented to [facility] on 1/23 with confusion, left sided chest pain, shortness of breath, and coughing. She was found to be hypoxic on room air and placed on 2 L [place]. CT chest was done without evidence of PE.  MRI revealed a 1.3cm acute to early subacute right PICA infarct. Also was admitted with pneumonia due to COVID and RSV and was started on antibiotics for likely superimposed bacterial infection.    On 1/30 early morning the patient was ~2 hrs into dialysis when her blood pressure dropped. UF goal was decreased but since the patient's level of consciousnesses decreased, she received 300 cc NS bolus. EKG changes were noted and rapid response was activated, dialysis stopped, and blood returned to the patient. Around 6:30 AM the patient was noted to be apneic without a pulse and code blue was called. Patient was noted to have PEA arrest. CPR was done for a few minutes before ROSC was achieved. She was transferred to 77M and patient was intubated due to failure to protect airway.  She continued to be tenuous and minimally responsive, finally she was extubated on 12/03/2023 transferred to my care on 12/09/2023, early in the morning she was fairly stable however within a few hours she had an episode of  emesis witnessed by her husband subsequently developed A-fib RVR and respiratory distress, cardiology PCCM again called bedside likely will be transferred to ICU soon.   Significant Hospital Events & Prcedures:    1/23 admit to [facility] for acute infarct + covid/rsv pneumonia 1/30 admit to ICU after patient had PEA arrest  2/1 Failed SBT due to apnea. ABG with respiratory alkalosis. RR decreased 2/2 Tolerating SBT. Remains encephalopathic 2/5 Continues to tolerate SBT during day, but remains encephalopathic/unable to protect airway if extubated  2/9 extubated, off precedex 2/12 still lethargic, ABG 2/11 unremarkable, limit centrally acting meds /15 transferred to my care, still somnolent on NG tube feeds, aspirated her vomitus, again in respiratory distress, likely will go back to ICU       Subjective:    Carolyn Russo today in bed, breathing heavily, unable to answer questions.  Appears lethargic.   Assessment  & Plan :    PEA arrest: due to hypotension during HD, not primarily cardiac in nature, intubated for airway protection and stabilized in ICU, seen by cardiology initially, now stable from cardiac arrest standpoint.  Acute encephalopathy: secondary to post-arrest, stroke, metabolic derangements, and drug induced. Concern for cefepime toxicity given her hx of ESRD, med was discontinued 2/4. MRI brain did show a new and acute central left cerebellar infarct.  - Appreciate neurology recs.  Stroke not felt to be significant enough to explain altered mental status, Seroquel and amitriptyline have been stopped, last ABG appears stable without any hypercarbia, unfortunately she has aspirated her vomitus again on 12/09/2023.  Will continue to monitor closely.  Currently still minimally responsive.  NG tube feed bound.  Aspiration on 12/09/2023 followed by acute hypoxic respiratory failure with respiratory distress and A-fib RVR.  NT suctioned on bedside by ICU team, nebulizer treatment, oxygen  supplementation, IV Cardizem and Unasyn.  Monitor.  A-fib RVR.  Currently see above.  Hypertension -Currently monitor on Cardizem drip   Leukocytosis Sinusitis  - MRI brain noted progressed mastoid effusions - no fever overnight, WBC trending down - Augmentin BID x 10 days (renal dosed) - trend fever curve   Pneumonia due to COVID and RSV Productive Cough  Received empiric treatment with ceftriaxone and azithro for possible superimposed bacterial pneumonia and also s/p course of solumedrol. Also received 6 days of cefepime post-code- discontinued for improved resp status and possible neurotoxicity.  -Unfortunately has aspirated again on 12/09/2023, Unasyn for now and monitor.   Acute to early subacute right PICA infarct on 1/24, resolved New acute central left cerebellar infarct  - Appreciate neuro follow up  - Continue DAPT with aspirin and brilinta - Continue statin - Needs 30d heart monitor at discharge   Abnormal C6 signal MRI showed a nonspecific bone marrow signal of c6 vertebral body - unclear if related to a malignancy vs discitis/osteomyelitis. Will hold off on C spine imaging, as family does not want to pursue this workup and priority is mental status/neuro recovery.  - If no other findings, consider C spine MRI to further evaluate   Seizure disorder - Continue home keppra 500 mg BID seen by neurology this admission.   ESRD on HD MWF - Dialysis per nephro - Discontinued renvela due to low phos levels   Anemia of chronic disease - 1 unit blood on 2/10, Hgb stable - trend CBC - Aranesp every Friday    Diabetes - Continue SSI - Tube feeds  CBG (last 3)  Recent Labs    12/09/23 0031 12/09/23 0323 12/09/23 0835  GLUCAP 180* 191* 203*         Condition - Extremely Guarded  Family Communication  :  husband x 2 on 12/09/23  Code Status :  Full  Consults  :  PCCM, Cards, Pall Care, Renal,     PUD Prophylaxis : PPI  Procedures  :             Disposition Plan  :    Status is: Inpatient    DVT Prophylaxis  :    SCDs Start: 11/23/23 0716 heparin injection 5,000 Units Start: 11/17/23 0930     Lab Results  Component Value Date   PLT 472 (H) 12/09/2023    Diet :  Diet Order             Diet NPO time specified  Diet effective now                    Inpatient Medications  Scheduled Meds:   stroke: early stages of recovery book   Does not apply Once   amoxicillin-clavulanate  500 mg Per Tube Q12H   arformoterol  15 mcg Nebulization BID   aspirin  81 mg Per Tube Daily   budesonide (PULMICORT) nebulizer solution  0.5 mg Nebulization BID   Chlorhexidine Gluconate Cloth  6 each Topical Q0600   Chlorhexidine Gluconate Cloth  6 each Topical Q0600   darbepoetin (ARANESP) injection - DIALYSIS  150 mcg Subcutaneous Q Fri-1800   ferrous sulfate  300 mg Per Tube Daily   heparin  2,500 Units Dialysis Once in dialysis   heparin injection (subcutaneous)  5,000 Units Subcutaneous Q8H   hydrALAZINE  50 mg Per Tube Q8H   insulin aspart  0-15 Units Subcutaneous Q4H   insulin glargine-yfgn  5 Units Subcutaneous Daily   levETIRAcetam  500 mg Per Tube BID   multivitamin  1 tablet Per Tube QHS   nutrition supplement (JUVEN)  1 packet Per Tube BID BM   mouth rinse  15 mL Mouth Rinse Q4H   rosuvastatin  10 mg Per Tube Daily   sodium chloride flush  3 mL Intravenous Q12H   ticagrelor  90 mg Per Tube BID   Continuous Infusions:  anticoagulant sodium citrate     diltiazem (CARDIZEM) infusion 5 mg/hr (12/09/23 1103)   feeding supplement (VITAL 1.5 CAL) 45 mL/hr at 12/08/23 1600   lactated ringers     PRN Meds:.acetaminophen **OR** acetaminophen, alteplase, anticoagulant sodium citrate, diltiazem, docusate, feeding supplement (NEPRO CARB STEADY), heparin, heparin, ipratropium-albuterol, labetalol, lidocaine (PF), lidocaine-prilocaine, ondansetron (ZOFRAN) IV, mouth rinse, pentafluoroprop-tetrafluoroeth, polyethylene glycol,  white petrolatum, witch hazel-glycerin    Objective:   Vitals:   12/09/23 0824 12/09/23 0836 12/09/23 0931 12/09/23 1031  BP:  (!) 138/53    Pulse: 92 90 99 (!) 149  Resp: (!) 23 (!) 25  (!) 30  Temp:  97.9 F (36.6 C)    TempSrc:  Axillary    SpO2: 100% 100% 100% 100%  Weight:      Height:        Wt Readings from Last 3 Encounters:  12/09/23 74 kg  11/01/23 79.5 kg  08/24/23 83 kg     Intake/Output Summary (Last 24 hours) at 12/09/2023 1122 Last data filed at 12/08/2023 1838 Gross per 24 hour  Intake 0 ml  Output 1500 ml  Net -1500 ml     Physical Exam  Somnolent and minimally arousable, breathing heavily, NG tube in place, R BKA [place].AT,PERRAL Supple Neck, No JVD,   Symmetrical Chest wall movement, Good air movement bilaterally, bilateral breath sounds RRR,No Gallops,Rubs or new Murmurs,  +ve B.Sounds, Abd Soft, No tenderness,   No Cyanosis, Clubbing or edema     RN pressure injury documentation: Pressure Injury 12/02/23 Sacrum Medial Stage 2 -  Partial thickness  loss of dermis presenting as a shallow open injury with a red, pink wound bed without slough. (Active)  12/02/23 1402  Location: Sacrum  Location Orientation: Medial  Staging: Stage 2 -  Partial thickness loss of dermis presenting as a shallow open injury with a red, pink wound bed without slough.  Wound Description (Comments):   Present on Admission: No  Dressing Type Foam - Lift dressing to assess site every shift 12/08/23 0830      Data Review:    Recent Labs  Lab 12/05/23 0320 12/05/23 1038 12/06/23 0351 12/07/23 0219 12/08/23 0352 12/09/23 0538  WBC 14.8*  --  17.9* 17.1* 16.3* 15.2*  HGB 8.9* 10.5* 9.2* 9.3* 9.6* 9.2*  HCT 25.7* 31.0* 26.9* 27.2* 28.0* 27.4*  PLT 439*  --  464* 456* 473* 472*  MCV 86.2  --  86.8 87.7 87.5 89.3  MCH 29.9  --  29.7 30.0 30.0 30.0  MCHC 34.6  --  34.2 34.2 34.3 33.6  RDW 17.3*  --  16.9* 16.8* 16.5* 16.8*  LYMPHSABS  --   --   --  2.0  --   --    MONOABS  --   --   --  1.6*  --   --   EOSABS  --   --   --  0.3  --   --   BASOSABS  --   --   --  0.1  --   --     Recent Labs  Lab 12/05/23 0320 12/05/23 1038 12/06/23 0351 12/07/23 0219 12/08/23 0352 12/09/23 0538  NA 133* 130* 133* 132* 131* 131*  K 4.4 4.1 4.7 3.9 4.5 4.4  CL 95*  --  88* 90* 91* 94*  CO2 26  --  27 28 27 25   ANIONGAP 12  --  18* 14 13 12   GLUCOSE 143*  --  195* 196* 205* 201*  BUN 10  --  29* 25* 55* 31*  CREATININE 2.60*  --  3.75* 2.64* 3.75* 2.76*  ALBUMIN 2.1*  --  2.1* 2.0* 2.1* 2.1*  PHOS 2.4*  --  2.5 1.7* 2.3* 2.0*  CALCIUM 8.7*  --  9.7 9.0 9.1 8.9      Recent Labs  Lab 12/05/23 0320 12/06/23 0351 12/07/23 0219 12/08/23 0352 12/09/23 0538  CALCIUM 8.7* 9.7 9.0 9.1 8.9    --------------------------------------------------------------------------------------------------------------- Lab Results  Component Value Date   CHOL 90 11/17/2023   HDL 38 (L) 11/17/2023   LDLCALC 37 11/17/2023   TRIG 86 12/02/2023   CHOLHDL 2.4 11/17/2023    Lab Results  Component Value Date   HGBA1C 5.8 (H) 10/26/2023   No results for input(s): "TSH", "T4TOTAL", "FREET4", "T3FREE", "THYROIDAB" in the last 72 hours. No results for input(s): "VITAMINB12", "FOLATE", "FERRITIN", "TIBC", "IRON", "RETICCTPCT" in the last 72 hours. ------------------------------------------------------------------------------------------------------------------ Cardiac Enzymes No results for input(s): "CKMB", "TROPONINI", "MYOGLOBIN" in the last 168 hours.  Invalid input(s): "CK"  Micro Results No results found for this or any previous visit (from the past 240 hours).  Radiology Report No results found.   Signature  -   Susa Raring M.D on 12/09/2023 at 11:22 AM   -  To page go to [email or web address]

## 2023-12-09 NOTE — Progress Notes (Signed)
 NAME:  Carolyn Russo, MRN:  [id number], DOB:  06-24-1951, LOS: 23 ADMISSION DATE:  11/16/2023, CONSULTATION DATE:  1/30 REFERRING MD:  TRH, CHIEF COMPLAINT:  code blue   History of Present Illness:  Carolyn Russo is a 73 yo female with ESRD on HD MWF, CHF, previous CVA with residual left sided weakness, T2DM, right BKA, and seizure disorder. She presented to [facility] on 1/23 with confusion, left sided chest pain, shortness of breath, and coughing. She was found to be hypoxic on room air and placed on 2 L [place]. CT chest was done without evidence of PE.  MRI revealed a 1.3cm acute to early subacute right PICA infarct. Also was admitted with pneumonia due to COVID and RSV and was started on antibiotics for likely superimposed bacterial infection.    On 1/30 early morning the patient was ~2 hrs into dialysis when her blood pressure dropped. UF goal was decreased but since the patient's level of consciousnesses decreased, she received 300 cc NS bolus. EKG changes were noted and rapid response was activated, dialysis stopped, and blood returned to the patient. Around 6:30 AM the patient was noted to be apneic without a pulse and code blue was called. Patient was noted to have PEA arrest. CPR was done for a few minutes before ROSC was achieved. She was transferred to 54M and patient was intubated due to failure to protect airway.  Pertinent  Medical History  ESRD CHF CVA T2DM Seizures  Significant Hospital Events: Including procedures, antibiotic start and stop dates in addition to other pertinent events   1/23 admit to [facility] for acute infarct + covid/rsv pneumonia 1/30 admit to ICU after patient had PEA arrest  2/1 Failed SBT due to apnea. ABG with respiratory alkalosis. RR decreased 2/2 Tolerating SBT. Remains encephalopathic 2/5 Continues to tolerate SBT during day, but remains encephalopathic/unable to protect airway if extubated  2/9 extubated, off precedex 2/12 still lethargic, ABG 2/11  unremarkable, limit centrally acting meds  Interim History / Subjective:   Had aspiration event this morning and developed afib with RVR Cardiology consulted, started on amiodarone after not responding to diltiazem Transferred back to ICU for monitoring Discussed with husband risk of intubation and recommendation of trach there after.  Objective   Blood pressure (!) 133/46, pulse (!) 123, temperature 98.6 F (37 C), temperature source Oral, resp. rate (!) 30, height 5\' 3"  (1.6 m), weight 51.4 kg, SpO2 97%.        Intake/Output Summary (Last 24 hours) at 12/09/2023 1718 Last data filed at 12/09/2023 1600 Gross per 24 hour  Intake 669.96 ml  Output 1500 ml  Net -830.04 ml   Filed Weights   12/08/23 1838 12/09/23 0437 12/09/23 1231  Weight: 70.5 kg 74 kg 51.4 kg    Examination: Gen:      Acute distress, increased work of breathing Lungs:    course breath sounds CV:         regularly irregular, tachycardic Abd:      + bowel sounds; soft, non-tender, no distension Ext:    No edema, right BKA Skin:      Warm and dry; no rash Neuro:  awake, spontaneously moving  CXR with left lower lobe atelectasis and right basilar opacities  Resolved Hospital Problem list   Hypokalemia Hypotension  Assessment & Plan:  Atrial Fibrillation with RVR PEA arrest: due to hypotension, not primarily cardiac in nature  - cardiology consulted -started on amiodarone  Acute Hypoxemic Respiratory Failure Aspiration Pneumonia/pneumonitis  Atelectasis Pneumonia due to COVID and RSV Productive Cough  - Continue with NT suction PRN - Guaifenesin Q6H  - Chest PT - high risk for re-intubation  Acute encephalopathy: secondary to post-arrest, stroke, metabolic derangements, and drug induced. Concern for cefepime toxicity given her hx of ESRD, med was discontinued 2/4. MRI brain did show a new and acute central left cerebellar infarct.  - Appreciate neurology recs.  Stroke not felt to be significant  enough to explain altered mental status - Stopped seroquel and amitriptyline - Limit centrally acting medications as possible - Last ABG without hypercarbia  - 2/12 CXR with pulmonary edema on HD  Hypertension - Hypotensive initially, but now mildly hypertensive, getting HD  - Home hydralazine at reduced dose, monitor  - nephrology following   Leukocytosis Sinusitis  - MRI brain noted progressed mastoid effusions - no fever overnight, WBC trending down - Augmentin BID x 10 days (renal dosed) - trend fever curve  Acute to early subacute right PICA infarct on 1/24, resolved New acute central left cerebellar infarct  - Appreciate neuro follow up  - Continue DAPT with aspirin and brilinta - Continue statin - Needs 30d heart monitor at discharge  Abnormal C6 signal MRI showed a nonspecific bone marrow signal of c6 vertebral body - unclear if related to a malignancy vs discitis/osteomyelitis. Will hold off on C spine imaging, as family does not want to pursue this workup and priority is mental status/neuro recovery.  - If no other findings, consider C spine MRI to further evaluate  Seizure disorder - Continue home keppra 500 mg BID  ESRD on HD MWF - Dialysis per nephro - Discontinued renvela due to low phos levels  Anemia of chronic disease - 1 unit blood on 2/10, Hgb stable - trend CBC - Aranesp every Friday   Diabetes - Continue SSI - Tube feeds    Best Practice (right click and "Reselect all SmartList Selections" daily)   Diet/type: tubefeeds  DVT prophylaxis prophylactic heparin  Pressure ulcer(s): pressure ulcer assessment deferred  GI prophylaxis: H2B Lines: R subclavian HD cath,  Foley:  N/A Code Status:  full code Last date of multidisciplinary goals of care discussion [updated husband, wishes full code. Open to talking with palliative care]   Critical care time: 40 minutes   Melody Comas, MD [facility] Office:  [phone]   See Amion for personal pager PCCM on call pager [phone] until 7pm. Please call Elink 7p-7a. [phone]

## 2023-12-09 NOTE — Plan of Care (Signed)
  Problem: Nutritional: Goal: Maintenance of adequate nutrition will improve Outcome: Progressing Goal: Progress toward achieving an optimal weight will improve Outcome: Progressing   Problem: Nutritional: Goal: Progress toward achieving an optimal weight will improve Outcome: Progressing

## 2023-12-09 NOTE — Progress Notes (Addendum)
 Rounding Note    Patient Name: Carolyn Russo Date of Encounter: 12/09/2023  [facility] Cardiologist: Chilton Si, MD   Subjective   Pt responds to touch, but no coherent speech. Restless in the bed, unable to follow commands.  Husband states she vomited this am, secretions by description.  Nursing says they got up a large amt secretions by deep suctioning.   Inpatient Medications    Scheduled Meds:   stroke: early stages of recovery book   Does not apply Once   arformoterol  15 mcg Nebulization BID   aspirin  81 mg Per Tube Daily   budesonide (PULMICORT) nebulizer solution  0.5 mg Nebulization BID   Chlorhexidine Gluconate Cloth  6 each Topical Q0600   Chlorhexidine Gluconate Cloth  6 each Topical Q0600   darbepoetin (ARANESP) injection - DIALYSIS  150 mcg Subcutaneous Q Fri-1800   ferrous sulfate  300 mg Per Tube Daily   heparin  2,500 Units Dialysis Once in dialysis   heparin injection (subcutaneous)  5,000 Units Subcutaneous Q8H   hydrALAZINE  50 mg Per Tube Q8H   insulin aspart  0-15 Units Subcutaneous Q4H   insulin glargine-yfgn  5 Units Subcutaneous Daily   levETIRAcetam  500 mg Per Tube BID   multivitamin  1 tablet Per Tube QHS   nutrition supplement (JUVEN)  1 packet Per Tube BID BM   mouth rinse  15 mL Mouth Rinse Q4H   rosuvastatin  10 mg Per Tube Daily   sodium chloride flush  3 mL Intravenous Q12H   ticagrelor  90 mg Per Tube BID   Continuous Infusions:  ampicillin-sulbactam (UNASYN) IV     anticoagulant sodium citrate     diltiazem (CARDIZEM) infusion 5 mg/hr (12/09/23 1103)   feeding supplement (VITAL 1.5 CAL) 45 mL/hr at 12/08/23 1600   lactated ringers     PRN Meds: acetaminophen **OR** acetaminophen, alteplase, anticoagulant sodium citrate, diltiazem, docusate, feeding supplement (NEPRO CARB STEADY), heparin, heparin, ipratropium-albuterol, labetalol, lidocaine (PF), lidocaine-prilocaine, ondansetron (ZOFRAN) IV, mouth rinse,  pentafluoroprop-tetrafluoroeth, polyethylene glycol, white petrolatum, witch hazel-glycerin   Vital Signs    Vitals:   12/09/23 0836 12/09/23 0931 12/09/23 1031 12/09/23 1139  BP: (!) 138/53   115/83  Pulse: 90 99 (!) 149 (!) 138  Resp: (!) 25  (!) 30 16  Temp: 97.9 F (36.6 C)     TempSrc: Axillary     SpO2: 100% 100% 100% 100%  Weight:      Height:        Intake/Output Summary (Last 24 hours) at 12/09/2023 1157 Last data filed at 12/08/2023 1838 Gross per 24 hour  Intake 0 ml  Output 1500 ml  Net -1500 ml      12/09/2023    4:37 AM 12/08/2023    6:38 PM 12/08/2023    1:41 PM  Last 3 Weights  Weight (lbs) 163 lb 2.3 oz 155 lb 6.8 oz 158 lb 11.7 oz  Weight (kg) 74 kg 70.5 kg 72 kg      Telemetry    SR >> Rapid atrial fib at 10:34 this am - Personally Reviewed  ECG    Atrial fib, RVR, HR 152 - Personally Reviewed  Physical Exam   GEN: No acute distress.   Neck: No JVD seen but difficult to assess 2nd pt movements Cardiac: rapid and slightly irreg R&R, no murmurs, rubs, or gallops.  Respiratory: rales and rhonchi to auscultation bilaterally. GI: Soft, nontender, non-distended  MS: No edema; No  deformity. S/p R BKA Neuro:  Nonfocal  Psych: Normal affect   Labs    High Sensitivity Troponin:   Recent Labs  Lab 11/16/23 1044 11/16/23 1651 11/27/23 1840 11/27/23 1954  TROPONINIHS 205* 199* 38* 34*     Chemistry Recent Labs  Lab 12/07/23 0219 12/08/23 0352 12/09/23 0538  NA 132* 131* 131*  K 3.9 4.5 4.4  CL 90* 91* 94*  CO2 28 27 25   GLUCOSE 196* 205* 201*  BUN 25* 55* 31*  CREATININE 2.64* 3.75* 2.76*  CALCIUM 9.0 9.1 8.9  ALBUMIN 2.0* 2.1* 2.1*  GFRNONAA 18* 12* 17*  ANIONGAP 14 13 12     Lipids No results for input(s): "CHOL", "TRIG", "HDL", "LABVLDL", "LDLCALC", "CHOLHDL" in the last 168 hours.  Hematology Recent Labs  Lab 12/07/23 0219 12/08/23 0352 12/09/23 0538  WBC 17.1* 16.3* 15.2*  RBC 3.10* 3.20* 3.07*  HGB 9.3* 9.6* 9.2*  HCT  27.2* 28.0* 27.4*  MCV 87.7 87.5 89.3  MCH 30.0 30.0 30.0  MCHC 34.2 34.3 33.6  RDW 16.8* 16.5* 16.8*  PLT 456* 473* 472*   Thyroid No results for input(s): "TSH", "FREET4" in the last 168 hours.  BNPNo results for input(s): "BNP", "PROBNP" in the last 168 hours.  DDimer No results for input(s): "DDIMER" in the last 168 hours.   Radiology    DG CHEST PORT 1 VIEW Result Date: 12/09/2023 CLINICAL DATA:  [id number] Respiratory failure [facility]) [phone] [id number] Atrial fibrillation with RVR (HCC) [id number] EXAM: PORTABLE CHEST 1 VIEW COMPARISON:  December 06, 2023 FINDINGS: The cardiomediastinal silhouette is unchanged in contour. The enteric tube courses through the chest to the abdomen beyond the field-of-view. RIGHT chest CVC with tip terminating over the RIGHT atrium. Small bilateral pleural effusions. No pneumothorax. Increased platelike opacity at the RIGHT lung base. Persistent LEFT retrocardiac opacity. IMPRESSION: Small bilateral pleural effusions with increased platelike opacity at the RIGHT lung base, favored to reflect atelectasis. Differential considerations include superimposed aspiration or infection. Electronically Signed   By: Meda Klinefelter M.D.   On: 12/09/2023 11:27    Cardiac Studies   Echo 11/17/23: 1. Left ventricular ejection fraction, by estimation, is 55 to 60%. The  left ventricle has normal function. The left ventricle has no regional  wall motion abnormalities. Elevated left ventricular end-diastolic  pressure.   2. Right ventricular systolic function is normal. The right ventricular  size is normal. There is severely elevated pulmonary artery systolic  pressure.   3. The mitral valve is normal in structure. Trivial mitral valve  regurgitation. No evidence of mitral stenosis.   4. The aortic valve is tricuspid. There is mild calcification of the  aortic valve. Aortic valve regurgitation is not visualized. No aortic  stenosis is present. Aortic valve mean gradient measures  11.0 mmHg. Aortic  valve Vmax measures 2.16 m/s.   5. The inferior vena cava is dilated in size with <50% respiratory  variability, suggesting right atrial pressure of 15 mmHg.    Echo 11/28/23:  1. There is prominent infrerior wall pseudodyskinesis due to increased  infradiaphragmatic pressure (distended stomach?). Left ventricular  ejection fraction, by estimation, is 55 to 60%. The left ventricle has  normal function. There is mild concentric  left ventricular hypertrophy.   2. Right ventricular systolic function is normal. The right ventricular  size is normal. Tricuspid regurgitation signal is inadequate for assessing  PA pressure.   3. Left atrial size was mildly dilated.   4. The mitral valve is normal in structure. Trivial  mitral valve  regurgitation. No evidence of mitral stenosis.   5. The aortic valve is tricuspid. There is mild thickening of the aortic  valve. Aortic valve regurgitation is not visualized. Aortic valve  sclerosis is present, with no evidence of aortic valve stenosis.   6. The inferior vena cava is dilated in size with <50% respiratory  variability, suggesting right atrial pressure of 15 mmHg.   Patient Profile     Ms Cerney is 73 y.o. female hypertension, hyperlipidemia, diabetes mellitus, PVD (status post laser atherectomy with right posterior tibial stent 03/12/2022 and subsequent right BKA in 2024) PAD, CAD (cath 2018 with 30 to 40% proximal LAD), CVA, end-stage renal disease on HD, COPD, history of GI bleed and SVT on Zio patch 2024 with 2 pauses due to complete heart block lasting 5.8 seconds).   She was admitted at the end of December for a week due to complaints of chest pain and was felt to be musculoskeletal nature at that time. 2D echo at that time showed EF 55 to 60% with moderate LVH and mild pulmonary hypertension. She had mild troponin elevation at that time at 66 and 57. Cardiology was not consulted at that time   She presented 11/16/2023 with  complaints of chest pain to the ER. Apparently was found on the floor by SNF after fall and out of bed and complained of chest pain as well as shoulder pain and was sent to the ER. In the ER sodium 136, potassium 3.3, serum creatinine 4.79, high-sensitivity troponin 205, hemoglobin 9.9. EKG showed inferior infarct age undetermined with no acute ST changes. Chest x-ray showed vascular congestion. She was hypoxic on arrival with O2 sat 88% on room air. Chest CTA is pending for evaluation for acute PE. Cardiology initally asked to evaluate due to elevated troponin and atypical chest pain.   On 01/30, she became hypotensive w/ HD and had PEA arrest req CPR. After evaluation, event not felt cardiac in nature.  Although trop peak 205, in the setting of acute illness and known mild CAD, no ischemic eval planned. Volume mgt per HD.   On 02/05, no further cardiac workup indicated, and pt encephalopathic w/ new and acute central and L cerebellar infarct on MRI. Cards signed off.  Cards reconsulted 02/15 due to atrial fib, RVR.   Assessment & Plan    Atrial fib, RVR -  pt was on telemetry when she converted, so clearly documented when it started. - unable to assess sx due to pt underlying condition. - she has been started on IV Dilt, but cannot increase past 5 mg/hr due to borderline hypotension  2. Aspiration/infection - great concern for pt not able to manage her secretions - Dr Thedore Mins aware >> pt has been tx to 3M08  3. Encephalopathy - happened in setting of acute illness and subsequent CVA - per IM, Neuro     For questions or updates, please contact [facility] Please consult [email or web address] for contact info under        Signed, Theodore Demark, PA-C  12/09/2023, 11:57 AM    Attending Note   Patient seen and discussed with PA Barrett, I agree with her documentation. Cardiology has previously signed off from patient 11/29/23, reconsulted for new onset afib today. She was  orginally admitted with PEA arrest earlier this admission    1.Afib - new onset this admission - earlier today following emesis developed respiratory distress, went into afib with RVR - transferred to ICU given respiratory distress  secondary to aspiration - initially started on IV dilt gtt, limited dosing due to low bp's. Drip at 15 with SBP 90s, rates still 130s.  - will d/c diltiazem, start IV amiodarone gtt.  - no prior known afib, looks to be exacerbated by her acute respiratory event. At this time would not commit to long term anticoagulation.   -08/2023 monitor complete heart block episodes up to 5.8 seconds, was to see EP as outpatient. Beta blocker stopped. Monitor rhythm closely on amio.   2. PEA - earlier this admission, from notes hypotensive during HD and suffered PEA arrest - from prior cardiology notes given PEA arrest and no significant findings by trops, EKG, or echo thought noncardiac arrest and no further cardiac testing was planned - post arrest course complicated by encephalopathy, prolonged intubation    3. CAD - LHC in 2018 revealed heavily calcified 30-40% proximal LAD disease  - no acute issues this admission  4. Encephalopathy - issues this admit with AMS - from notes secondary to post-arrest, stroke, metabolic derangements, and drug induced   5. Pneumonia COVID/RSV - per primary team   Dina Rich MD

## 2023-12-09 NOTE — Progress Notes (Incomplete)
 Attending Note   Patient seen and discussed with PA Barrett, I agree with her documentation. Cardiology has previously signed off from patient 11/29/23, reconsulted for new onset afib today. She was orginally admitted with PEA arrest earlier this admission    1.Afib - new onset this admission - earlier today following emesis developed respiratory distress, went into afib with RVR - transferred to ICU given respiratory distress secondary to aspiration - initially started on IV dilt gtt, limited dosing due to low bp's. Drip at 15 with SBP 90s, rates still 130s.  - will d/c diltiazem, start IV amiodarone gtt.  - no prior known afib, looks to be exacerbated by her acute respiratory event. At this time would not commit to long term anticoagulation.   -08/2023 monitor complete heart block episodes up to 5.8 seconds, was to see EP as outpatient. Beta blocker stopped. Monitor rhythm closely on amio.   2. PEA - earlier this admission, from notes hypotensive during HD and suffered PEA arrest - from prior cardiology notes given PEA arrest and no significant findings by trops, EKG, or echo thought noncardiac arrest and no further cardiac testing was planned - post arrest course complicated by encephalopathy, prolonged intubation    3. CAD - LHC in 2018 revealed heavily calcified 30-40% proximal LAD disease  - no acute issues this admission  4. Encephalopathy - issues this admit with AMS - from notes secondary to post-arrest, stroke, metabolic derangements, and drug induced   5. Pneumonia COVID/RSV - per primary team   Dina Rich MD

## 2023-12-09 NOTE — Progress Notes (Signed)
 Pharmacy Antibiotic Note  Carolyn Russo is a 73 y.o. female admitted on 11/16/2023 with aspiration pneumonia.  Pharmacy has been consulted for Unasyn dosing.  Patient has right lower lobe opacities and left lower lobe atelectasis due to the aspiration based on the CXR Patient is afebrile, tachycardic, and WBC 15 (down from 17).   Plan: Discontinue Augmentin  Begin Unasyn 3 gm IV q12h based on CrCl of 17 mL/min Monitor clinical improvement and renal function   Height: 5\' 3"  (160 cm) Weight: 74 kg (163 lb 2.3 oz) IBW/kg (Calculated) : 52.4  Temp (24hrs), Avg:98 F (36.7 C), Min:97.7 F (36.5 C), Max:98.9 F (37.2 C)  Recent Labs  Lab 12/05/23 0320 12/06/23 0351 12/07/23 0219 12/08/23 0352 12/09/23 0538  WBC 14.8* 17.9* 17.1* 16.3* 15.2*  CREATININE 2.60* 3.75* 2.64* 3.75* 2.76*    Estimated Creatinine Clearance: 17 mL/min (A) (by C-G formula based on SCr of 2.76 mg/dL (H)).    Allergies  Allergen Reactions   Almond Oil Anaphylaxis   Food Anaphylaxis    Peanuts - anaphylaxis     Gadolinium Derivatives Other (See Comments)    Gadolinium-Containing Contrast Media    Januvia [Sitagliptin] Other (See Comments)   Pork-Derived Products Other (See Comments)    Does not eat pork      Antimicrobials this admission: CTX/Azith 1/23 >> 1/28 Vanc 1/30 >>2/3 Cefepime 1/30 >>2/4 Augmentin 2/12 > 2/15 Unasyn 2/15 >>  Microbiology results: 1/23 RVP - RSV 1/23 Covid - positive 1/23 BCx - negative 1/30 BCx - [id number]  Thank you for allowing pharmacy to be a part of this patient's care.  [name] Smiling, PharmD PGY1 Pharmacy Resident 12/09/2023 11:37 AM

## 2023-12-09 NOTE — Progress Notes (Signed)
 RN detected a change in cardiac rhythm via on unit cardiac telemetry monitoring. RN then contacted central monitoring unit to confirm new onset of dysrhythmia. RN notified MD of change in cardiac rhythm to afib with RVR. MD came to bedside and added new orders.

## 2023-12-09 NOTE — Progress Notes (Signed)
 CPT held at this time. Pt w/ HR 145. MD at bedside and aware

## 2023-12-09 NOTE — Progress Notes (Signed)
 [facility] Progress Note   Subjective:    Seen and examined patient at bedside in the ICU. It appears she had an aspiration event on 5W where she became hypoxic and converted into Afib with RVR. Opens eyes and responds to voice. Tolerated yesterday's HD with net UF 1.5L Next HD 2/17.  Objective Vitals:   12/09/23 1216 12/09/23 1231 12/09/23 1247 12/09/23 1422  BP: (!) 99/59 (!) 144/113 124/77 117/68  Pulse: (!) 141 (!) 134 (!) 137   Resp: (!) 32 (!) 39 (!) 30   Temp:  97.9 F (36.6 C)    TempSrc:  Oral    SpO2: 99% 98% 100%   Weight:  51.4 kg    Height:       Physical Exam General: Chronically ill-appearing Heart: S1 and S2; No murmurs, gallops, or rubs Lungs: Coarse and rhonchi bilaterally (anterior) Abdomen: Soft and non-tender Extremities:No LE edema Dialysis Access: R femoral [facility]   Filed Weights   12/08/23 1838 12/09/23 0437 12/09/23 1231  Weight: 70.5 kg 74 kg 51.4 kg    Intake/Output Summary (Last 24 hours) at 12/09/2023 1428 Last data filed at 12/09/2023 1246 Gross per 24 hour  Intake 510 ml  Output 1500 ml  Net -990 ml    Additional Objective Labs: Basic Metabolic Panel: Recent Labs  Lab 12/07/23 0219 12/08/23 0352 12/09/23 0538  NA 132* 131* 131*  K 3.9 4.5 4.4  CL 90* 91* 94*  CO2 28 27 25   GLUCOSE 196* 205* 201*  BUN 25* 55* 31*  CREATININE 2.64* 3.75* 2.76*  CALCIUM 9.0 9.1 8.9  PHOS 1.7* 2.3* 2.0*   Liver Function Tests: Recent Labs  Lab 12/07/23 0219 12/08/23 0352 12/09/23 0538  ALBUMIN 2.0* 2.1* 2.1*   No results for input(s): "LIPASE", "AMYLASE" in the last 168 hours. CBC: Recent Labs  Lab 12/05/23 0320 12/05/23 1038 12/06/23 0351 12/07/23 0219 12/08/23 0352 12/09/23 0538  WBC 14.8*  --  17.9* 17.1* 16.3* 15.2*  NEUTROABS  --   --   --  13.0*  --   --   HGB 8.9*   < > 9.2* 9.3* 9.6* 9.2*  HCT 25.7*   < > 26.9* 27.2* 28.0* 27.4*  MCV 86.2  --  86.8 87.7 87.5 89.3  PLT 439*  --  464* 456* 473* 472*   < > =  values in this interval not displayed.   Blood Culture    Component Value Date/Time   SDES BLOOD RIGHT HAND 11/23/2023 1100   SDES BLOOD LEFT HAND 11/23/2023 1100   SPECREQUEST  11/23/2023 1100    BOTTLES DRAWN AEROBIC AND ANAEROBIC Blood Culture results may not be optimal due to an inadequate volume of blood received in culture bottles   SPECREQUEST  11/23/2023 1100    BOTTLES DRAWN AEROBIC AND ANAEROBIC Blood Culture results may not be optimal due to an inadequate volume of blood received in culture bottles   CULT  11/23/2023 1100    NO GROWTH 5 DAYS Performed at [facility], [address] [place], [place] [address]    CULT  11/23/2023 1100    NO GROWTH 5 DAYS Performed at [facility], [address] [place], [place] [address]    REPTSTATUS 11/28/2023 FINAL 11/23/2023 1100   REPTSTATUS 11/28/2023 FINAL 11/23/2023 1100    Cardiac Enzymes: No results for input(s): "CKTOTAL", "CKMB", "CKMBINDEX", "TROPONINI" in the last 168 hours. CBG: Recent Labs  Lab 12/08/23 2049 12/09/23 0031 12/09/23 6962 12/09/23 9528  12/09/23 1214  GLUCAP 186* 180* 191* 203* 137*   Iron Studies: No results for input(s): "IRON", "TIBC", "TRANSFERRIN", "FERRITIN" in the last 72 hours. Lab Results  Component Value Date   INR 1.1 11/17/2023   INR 1.0 06/22/2022   INR 1.2 05/10/2022   Studies/Results: DG CHEST PORT 1 VIEW Result Date: 12/09/2023 CLINICAL DATA:  [id number] Respiratory failure [facility]) [id number] Atrial fibrillation with RVR (HCC) [id number] EXAM: PORTABLE CHEST 1 VIEW COMPARISON:  December 06, 2023 FINDINGS: The cardiomediastinal silhouette is unchanged in contour. The enteric tube courses through the chest to the abdomen beyond the field-of-view. RIGHT chest CVC with tip terminating over the RIGHT atrium. Small bilateral pleural effusions. No pneumothorax. Increased platelike opacity at the RIGHT lung base. Persistent LEFT retrocardiac opacity. IMPRESSION: Small bilateral pleural  effusions with increased platelike opacity at the RIGHT lung base, favored to reflect atelectasis. Differential considerations include superimposed aspiration or infection. Electronically Signed   By: Meda Klinefelter M.D.   On: 12/09/2023 11:27    Medications:  amiodarone 60 mg/hr (12/09/23 1420)   Followed by   amiodarone     ampicillin-sulbactam (UNASYN) IV     anticoagulant sodium citrate     feeding supplement (VITAL 1.5 CAL) Stopped (12/09/23 1226)     stroke: early stages of recovery book   Does not apply Once   amiodarone  150 mg Intravenous Once   arformoterol  15 mcg Nebulization BID   aspirin  81 mg Per Tube Daily   budesonide (PULMICORT) nebulizer solution  0.5 mg Nebulization BID   Chlorhexidine Gluconate Cloth  6 each Topical Q0600   Chlorhexidine Gluconate Cloth  6 each Topical Q0600   darbepoetin (ARANESP) injection - DIALYSIS  150 mcg Subcutaneous Q Fri-1800   ferrous sulfate  300 mg Per Tube Daily   heparin  2,500 Units Dialysis Once in dialysis   heparin injection (subcutaneous)  5,000 Units Subcutaneous Q8H   hydrALAZINE  50 mg Per Tube Q8H   insulin aspart  0-15 Units Subcutaneous Q4H   insulin glargine-yfgn  5 Units Subcutaneous Daily   levETIRAcetam  500 mg Per Tube BID   multivitamin  1 tablet Per Tube QHS   nutrition supplement (JUVEN)  1 packet Per Tube BID BM   mouth rinse  15 mL Mouth Rinse Q4H   rosuvastatin  10 mg Per Tube Daily   sodium chloride flush  3 mL Intravenous Q12H   ticagrelor  90 mg Per Tube BID    Dialysis Orders: MWF [facility]  4h   B350    75.5kg   2/2 bath   R fem TDC  Heparin 2600 + 1400 midrun - last HD 1/22, getting to dry wt - venofer 100 qhd thru 1/31 - mircera 150 q2, last 1/17, due 1/31  Renal-related home meds: - rocaltrol 0.5 mwf - sensipar 30 every day - hydralazine 100 tid - losartan 50 bid - renvela 800mg  ac tid - others: brilinta, crestor, keppra, ritalin, neurontin, asa, PPI, dulera, insulin, albuterol,  elavil  Assessment/Plan: AHRF/ +COVID +RSV pna/ volume^: VDRF on 1/30, extubated 12/03/23 SP course of IV abx and HD. Per CCM.  CVA: R then L cerebellar infarcts d/t small vessel disease. Per neurology.  On  DAPT AMS: multifactorial. Neurology following Aspiration event 2/15 - Subsquently developed acute hypoxic respiratory failure with Afib RVR. Now transferred back to the ICU. ESRD: on HD MWF. Next HD 2/17.  Hypotension: resolved Volume: BP's labile, 3kg under, no edema. UF 1-1.5 L w/  HD, keep SBP > 105 Anemia of esrd - Hb is down to 8-9 range. Weekly sq darbe qFri. Tsat 26% and ferritin 1638. Pharm rec'd po ferrous sulfate daily.  Transfuse as needed Secondary hyperparathyroidism - phos in range. Calcium up when corrected, not on vdra.  Resume binders when taking po H/o seizure d/o - on Keppra PEA arrest  Carolyn Holmes, NP [facility] 12/09/2023,2:28 PM  LOS: 23 days

## 2023-12-09 NOTE — Progress Notes (Signed)
   12/09/23 1031  Vitals  Pulse Rate (!) 149  ECG Heart Rate (!) 150  Resp (!) 30  Level of Consciousness  Level of Consciousness Responds to Voice  MEWS COLOR  MEWS Score Color Red  Oxygen Therapy  SpO2 100 %  O2 Device Nasal Cannula  ECG Monitoring  QRS interval 0.09  CV Strip Heart Rate 160  Cardiac Rhythm Atrial fibrillation (RN Meeyah Ovitt notified)  MEWS Score  MEWS Temp 0  MEWS Systolic 0  MEWS Pulse 3  MEWS RR 2  MEWS LOC 1  MEWS Score 6  Provider Notification  Provider Name/Title Susa Raring MD  Date Provider Notified 12/09/23  Time Provider Notified 1110  Method of Notification Call;Face-to-face  Notification Reason New onset of dysrhythmia  Test performed and critical result Chest CT/ EKG  Date Critical Result Received 12/09/23  Type of New Onset of Dysrhythmia Atrial fibrillation (Afib with RVR)  Provider response See new orders;At bedside  Date of Provider Response 12/09/23

## 2023-12-10 DIAGNOSIS — I4891 Unspecified atrial fibrillation: Secondary | ICD-10-CM | POA: Diagnosis not present

## 2023-12-10 DIAGNOSIS — G9340 Encephalopathy, unspecified: Secondary | ICD-10-CM | POA: Diagnosis not present

## 2023-12-10 LAB — GLUCOSE, CAPILLARY
Glucose-Capillary: 107 mg/dL — ABNORMAL HIGH (ref 70–99)
Glucose-Capillary: 107 mg/dL — ABNORMAL HIGH (ref 70–99)
Glucose-Capillary: 126 mg/dL — ABNORMAL HIGH (ref 70–99)
Glucose-Capillary: 164 mg/dL — ABNORMAL HIGH (ref 70–99)
Glucose-Capillary: 77 mg/dL (ref 70–99)
Glucose-Capillary: 96 mg/dL (ref 70–99)

## 2023-12-10 MED ORDER — AMIODARONE HCL 200 MG PO TABS
200.0000 mg | ORAL_TABLET | Freq: Two times a day (BID) | ORAL | Status: DC
  Administered 2023-12-10: 200 mg via ORAL
  Filled 2023-12-10: qty 1

## 2023-12-10 MED ORDER — AMIODARONE HCL 200 MG PO TABS
200.0000 mg | ORAL_TABLET | Freq: Two times a day (BID) | ORAL | Status: AC
  Administered 2023-12-10 – 2023-12-16 (×13): 200 mg
  Filled 2023-12-10 (×13): qty 1

## 2023-12-10 MED ORDER — AMOXICILLIN-POT CLAVULANATE 500-125 MG PO TABS
1.0000 | ORAL_TABLET | Freq: Two times a day (BID) | ORAL | Status: AC
  Administered 2023-12-11 – 2023-12-15 (×10): 1
  Filled 2023-12-10 (×10): qty 1

## 2023-12-10 MED ORDER — AMOXICILLIN-POT CLAVULANATE 500-125 MG PO TABS
1.0000 | ORAL_TABLET | Freq: Two times a day (BID) | ORAL | Status: DC
  Administered 2023-12-10 (×2): 1 via ORAL
  Filled 2023-12-10 (×2): qty 1

## 2023-12-10 MED ORDER — CHLORHEXIDINE GLUCONATE CLOTH 2 % EX PADS
6.0000 | MEDICATED_PAD | Freq: Every day | CUTANEOUS | Status: DC
  Administered 2023-12-11 – 2023-12-15 (×4): 6 via TOPICAL

## 2023-12-10 NOTE — Evaluation (Signed)
 Clinical/Bedside Swallow Evaluation Patient Details  Name: Carolyn Russo MRN: [id number] Date of Birth: Jan 14, 1951  Today's Date: 12/10/2023 Time: SLP Start Time (ACUTE ONLY): 1034 SLP Stop Time (ACUTE ONLY): 1048 SLP Time Calculation (min) (ACUTE ONLY): 14 min  Past Medical History:  Past Medical History:  Diagnosis Date   Acute GI bleeding    Allergy    Anemia    Anterior chest wall pain    Appendicitis 1965   Asthma    Body mass index 37.0-37.9, adult    Breast pain    Cataract    both eyes   CHF (congestive heart failure) (HCC)    Cognitive change 04/20/2021   r/t cva 03/2821   Complication of anesthesia    Memory loss after general   Dehydration 2014   Deviated septum 1971   Diabetes mellitus    Dysphagia due to old stroke    easy to get strangled when eating   Dyspnea 2014   ESRD on hemodialysis (HCC)    MWF at [facility]   Extrinsic asthma    WITH ASTHMA ATTACK   Fibroid 1980   GERD (gastroesophageal reflux disease)    Heart murmur    History of migraine    History of seizure    with stroke   Hx gestational diabetes    Hyperlipidemia    Hypertension 2014   Inguinal hernia 1959   Malaise and fatigue 2014   Non-IgE mediated allergic asthma 2014   Obesity    Pelvic pain    Pregnancy, high-risk 1985   Stroke (HCC) 04/20/2021   (CVA) of right basal ganglia   Tonsillitis 1968   Uterine fibroid 1980   Visual field defect    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   Past Surgical History:  Past Surgical History:  Procedure Laterality Date   ABDOMINAL AORTOGRAM W/LOWER EXTREMITY N/A 02/21/2022   Procedure: ABDOMINAL AORTOGRAM W/LOWER EXTREMITY;  Surgeon: Maeola Harman, MD;  Location[facility] INVASIVE CV LAB;  Service: Cardiovascular;  Laterality: N/A;   AMPUTATION Right 05/18/2022   Procedure: RIGHT BELOW KNEE AMPUTATION;  Surgeon: Nadara Mustard, MD;  Location: [facility] OR;  Service: Orthopedics;  Laterality: Right;   AMPUTATION TOE Right 04/15/2022   Procedure: AMPUTATION  LST  TOERIGHT FOOT;  Surgeon: Edwin Cap, DPM;  Location: [facility] ORS;  Service: Podiatry;  Laterality: Right;   APPENDECTOMY  1959   BASCILIC VEIN TRANSPOSITION Right 04/30/2021   Procedure: RIGHT FIRST STAGE BASCILIC VEIN TRANSPOSITION;  Surgeon: Chuck Hint, MD;  Location: [facility] OR;  Service: Vascular;  Laterality: Right;   CESAREAN SECTION  1985   COLONOSCOPY     ESOPHAGOGASTRODUODENOSCOPY (EGD) WITH PROPOFOL N/A 04/18/2021   Procedure: ESOPHAGOGASTRODUODENOSCOPY (EGD) WITH PROPOFOL;  Surgeon: Iva Boop, MD;  Location: [facility] ENDOSCOPY;  Service: Endoscopy;  Laterality: N/A;   EYE SURGERY     bilateral cataract    FLEXIBLE SIGMOIDOSCOPY N/A 04/18/2021   Procedure: FLEXIBLE SIGMOIDOSCOPY;  Surgeon: Iva Boop, MD;  Location: [facility] ENDOSCOPY;  Service: Endoscopy;  Laterality: N/A;   HERNIA REPAIR  1959   IR FLUORO GUIDE CV LINE RIGHT  03/18/2021   IR US GUIDE VASC ACCESS RIGHT  03/18/2021   LEFT HEART CATH AND CORONARY ANGIOGRAPHY N/A 04/04/2017   Procedure: Left Heart Cath and Coronary Angiography;  Surgeon: Lyn Records, MD;  Location: [facility] INVASIVE CV LAB;  Service: Cardiovascular;  Laterality: N/A;   MYOMECTOMY  1980, 2004, 2007   PERIPHERAL VASCULAR THROMBECTOMY  02/21/2022  Procedure: PERIPHERAL VASCULAR THROMBECTOMY;  Surgeon: Maeola Harman, MD;  Location: [facility] INVASIVE CV LAB;  Service: Cardiovascular;;  Right Tibial   RHINOPLASTY  1971   ROTATOR CUFF REPAIR  2003   SURGICAL REPAIR OF HEMORRHAGE  2015   TONSILLECTOMY  1968   HPI:  Pt is a 73 y.o. female with who presented from SNF on 11/16/23 for chest pain, weakness and confusion. CT head revealed infarct in inferior right cerebellum which was new since 06/12/22. MRI brain with acute right inferomedial cerebellar ischemic infarction in the right PICA territory. Pt also diagnosed with COVID/RSV. MBS 1/28 DIGEST swallow severity rating of 1, mild. Recommended dys2/thin liquids. Secondary swallows needed to clear pharyngeal  residue. Experienced cardiac arrest on 1/30 and was intubated until 2/9 (ten days) and remained NPO thereafter with cortrak. PMH: CVA in 2022 and 2023, right BKA, HTN, DM-2, HLD, ESRD on HD MWF, asthma, dysphagia, CHF, heart murmur, seizure.    Assessment / Plan / Recommendation  Clinical Impression  Pt was seen for bedside swallow evaluation. She was lethargic during the evaluation and produced one unintelligible utterance, but not in response to SLP's questions. Pt did not follow commands despite cues so a complete oral mechanism exam was not completed, but oral inspection revealed adequate natural dentition. Pt roused with verbal and tactile stimulation, but exhibited difficulty maintaining an adequate level of alertness for prolonged periods. Increased WOB noted at baseline. Pt presented with symptoms of pharyngeal dysphagia characterized by significant coughing with small boluses of ice chips and puree. RR was 30-32 at baseline and increased to 38 with coughing, but returned to <30 by the end of the evaluation. It is recommended that the pt's NPO status be maintained and SLP will follow to assess improvement in swallow function. SLP Visit Diagnosis: Dysphagia, unspecified (R13.10)    Aspiration Risk  Severe aspiration risk    Diet Recommendation NPO    Medication Administration: Via alternative means    Other  Recommendations Oral Care Recommendations: Oral care QID    Recommendations for follow up therapy are one component of a multi-disciplinary discharge planning process, led by the attending physician.  Recommendations may be updated based on patient status, additional functional criteria and insurance authorization.  Follow up Recommendations Skilled nursing-short term rehab (<3 hours/day)      Assistance Recommended at Discharge    Functional Status Assessment Patient has had a recent decline in their functional status and demonstrates the ability to make significant improvements in  function in a reasonable and predictable amount of time.  Frequency and Duration min 2x/week  2 weeks       Prognosis Prognosis for improved oropharyngeal function: Fair Barriers to Reach Goals: Severity of deficits      Swallow Study   General Date of Onset: 12/03/23 HPI: Pt is a 73 y.o. female with who presented from SNF on 11/16/23 for chest pain, weakness and confusion. CT head revealed infarct in inferior right cerebellum which was new since 06/12/22. MRI brain with acute right inferomedial cerebellar ischemic infarction in the right PICA territory. Pt also diagnosed with COVID/RSV. MBS 1/28 DIGEST swallow severity rating of 1, mild. Recommended dys2/thin liquids. Secondary swallows needed to clear pharyngeal residue. Experienced cardiac arrest on 1/30 and was intubated until 2/9 (ten days) and remained NPO thereafter with cortrak. PMH: CVA in 2022 and 2023, right BKA, HTN, DM-2, HLD, ESRD on HD MWF, asthma, dysphagia, CHF, heart murmur, seizure. Type of Study: Bedside Swallow Evaluation Previous Swallow Assessment: See  HPI Diet Prior to this Study: Cortrak/Small bore NG tube Temperature Spikes Noted: No Respiratory Status: Nasal cannula History of Recent Intubation: Yes Total duration of intubation (days): 10 days Date extubated: 12/03/23 Behavior/Cognition: Lethargic/Drowsy;Doesn't follow directions Oral Cavity Assessment: Within Functional Limits Oral Care Completed by SLP: Recent completion by staff Oral Cavity - Dentition: Adequate natural dentition Self-Feeding Abilities: Total assist Patient Positioning: Upright in bed Baseline Vocal Quality: Low vocal intensity Volitional Cough: Strong Volitional Swallow: Unable to elicit    Oral/Motor/Sensory Function Overall Oral Motor/Sensory Function:  (UTA)   Ice Chips Ice chips: Impaired Presentation: Spoon Pharyngeal Phase Impairments: Cough - Immediate   Thin Liquid Thin Liquid: Not tested    Nectar Thick Nectar Thick Liquid:  Not tested   Honey Thick Honey Thick Liquid: Not tested   Puree Puree: Impaired Presentation: Spoon Pharyngeal Phase Impairments: Cough - Immediate   Solid     Solid: Not tested     William Schake I. Vear Clock, MS, CCC-SLP Acute Rehabilitation Services Office number [phone]  Scheryl Marten 12/10/2023,11:44 AM

## 2023-12-10 NOTE — Progress Notes (Addendum)
**Note Carolyn via Obfuscation**  [name], MRN:  [id number], DOB:  29-Dec-1950, LOS: 24 ADMISSION DATE:  11/16/2023, CONSULTATION DATE:  1/30 REFERRING MD:  TRH, CHIEF COMPLAINT:  code blue   History of Present Illness:  Carolyn Russo is a 73 yo female She presented to [facility] on 1/23 with confusion, left sided chest pain, shortness of breath, and coughing. She was found to be hypoxic on room air and placed on 2 L [place]. CT chest was done without evidence of PE.  MRI revealed a 1.3cm acute to early subacute right PICA infarct. Also was admitted with pneumonia due to COVID and RSV and was started on antibiotics for likely superimposed bacterial infection.    On 1/30 early morning the Russo was ~2 hrs into dialysis when her blood pressure dropped. UF goal was decreased but since the Russo's level of consciousnesses decreased, she received 300 cc NS bolus. EKG changes were noted and rapid response was activated, dialysis stopped, and blood returned to the Russo. Around 6:30 AM the Russo was noted to be apneic without a pulse and code blue was called. Russo was noted to have PEA arrest. CPR was done for a few minutes before ROSC was achieved. She was transferred to 4M and Russo was intubated due to failure to protect airway.  Pertinent  Medical History  ESRD/ HD MWF CHF CVA with residual LEFT sided weakness T2DM RIGHT BKA Seizures  Significant Hospital Events: Including procedures, antibiotic start and stop dates in addition to other pertinent events   1/23 admit to [facility] for acute infarct + covid/rsv pneumonia 1/30 admit to ICU after Russo had PEA arrest  2/1 Failed SBT due to apnea. ABG with respiratory alkalosis. RR decreased 2/2 Tolerating SBT. Remains encephalopathic 2/5 Continues to tolerate SBT during day, but remains encephalopathic/unable to protect airway if extubated  2/9 extubated, off precedex 2/12 still lethargic, ABG 2/11 unremarkable, limit centrally acting meds 2/15 transferred back to ICU  for atrial fibrillation with rapid ventricular response that developed after a gross aspiration event.  Interim History / Subjective:  Atrial fibrillation, rate controlled with amiodarone.  Awake, follows some commands.  Intermittently verbalizes single words: "help me", "hungry", able to tell me her name.  Objective   Blood pressure 117/76, pulse 93, temperature 98.8 F (37.1 C), temperature source Axillary, resp. rate (!) 26, height 5\' 3"  (1.6 m), weight 51.4 kg, SpO2 100%.        Intake/Output Summary (Last 24 hours) at 12/10/2023 0827 Last data filed at 12/10/2023 0800 Gross per 24 hour  Intake 1076.77 ml  Output --  Net 1076.77 ml   Filed Weights   12/08/23 1838 12/09/23 0437 12/09/23 1231  Weight: 70.5 kg 74 kg 51.4 kg    Examination: Gen:      Acute distress, increased work of breathing Lungs:    course breath sounds CV:         regularly irregular, tachycardic Abd:      + bowel sounds; soft, non-tender, no distension Ext:    No edema, right BKA Skin:      Warm and dry; no rash Neuro:  awake, spontaneously moving  CXR with left lower lobe atelectasis and right basilar opacities  Resolved Hospital Problem list   Hypokalemia Hypotension  Assessment & Plan:  Atrial Fibrillation with RVR PEA arrest: due to hypotension, not primarily cardiac in nature  Cardiology input appreciated started on amiodarone (2/15)  Acute Hypoxemic Respiratory Failure Aspiration Pneumonia/pneumonitis Atelectasis Pneumonia due to COVID and RSV  Productive Cough  Continue with NT suction PRN Guaifenesin Q6H  Chest PT high risk for re-intubation  Hyponatremia, mild Likely related to aspiration pneumonia Monitor serum Na  Acute encephalopathy: secondary to post-arrest, stroke, metabolic derangements, and drug induced. Concern for cefepime toxicity given her hx of ESRD, med was discontinued 2/4. MRI brain did show a new and acute central left cerebellar infarct.  - Appreciate neurology  recs.  Stroke not felt to be significant enough to explain altered mental status - Stopped seroquel and amitriptyline - Limit centrally acting medications as possible - Last ABG without hypercarbia  - 2/12 CXR with pulmonary edema on HD  ESRD on HD MWF - Dialysis per nephro - Discontinued Renvela due to low phos levels  Sinusitis  - MRI brain noted progressed mastoid effusions - no fever overnight, WBC trending down - Augmentin BID x 10 days (renal dosed).  Change IV to NG formulation. - trend fever curve  Acute to early subacute right PICA infarct on 1/24, resolved New acute central left cerebellar infarct  - Appreciate neuro follow up  - Continue DAPT with aspirin and brilinta - Continue statin - Needs 30d heart monitor at discharge  Hypertension Hypotensive initially, but now normotensive Home hydralazine at reduced dose, monitor  Nephrology help appreciated.   Abnormal C6 signal -MRI showed a nonspecific bone marrow signal of c6 vertebral body.  Unclear if related to a malignancy vs discitis/osteomyelitis.  Will hold off on C spine imaging, as family does not want to pursue this workup and priority is mental status/neuro recovery.  - If no other findings, consider C spine MRI to further evaluate  Seizure disorder - Continue home Keppra 500 mg BID  Anemia of chronic disease - 1 unit blood on 2/10, Hgb stable - trend CBC - Aranesp every Friday   Diabetes - Continue SSI - Tube feeds   Best Practice (right click and "Reselect all SmartList Selections" daily)   Diet/type: tubefeeds  DVT prophylaxis prophylactic heparin  Pressure ulcer(s): pressure ulcer assessment deferred  GI prophylaxis: H2B Lines: R subclavian HD cath,  Foley:  N/A Code Status:  full code Last date of multidisciplinary goals of care discussion [updated husband, wishes full code. Open to talking with palliative care]   Critical care time: - minutes   Marcelle Smiling, MD Board Certified by  the ABIM, Pulmonary Diseases & Critical Care Medicine  PCCM on call pager [phone] until 7pm. Please call Elink 7p-7a. [phone]

## 2023-12-10 NOTE — Progress Notes (Signed)
 Pt NTS at this time removing moderate amount of white, frothy, thick secretions.  Pt tolerated fairly well, (inc RR/HR).

## 2023-12-10 NOTE — Consult Note (Signed)
 Consultation Note Date: 12/10/2023   Patient Name: Carolyn Russo  DOB: 06/29/1951  MRN: [id number]  Age / Sex: 73 y.o., female  PCP: Annita Brod, MD Referring Physician: Martina Sinner, MD  Reason for Consultation:  "74F with ESRD on HD, CHF, prior CVA, DMII, right BKA and seizure disorder who is admitted with acute hypoxemic respiratory failure due to Covid and RSV pneumonia, c/b acute right PICA infarct and PEA arrest. Extubated 2/9 and stable on [place], has lots of secretions. Concern for high risk of deterioration. Family states would want full scope of care, full code. Husband is a physician and has support from their son. They want to engage with palliative team for support. "  HPI/Patient Profile: 73 y.o. female  with past medical history of ESRD on HD, CHF, stroke, DM 2, right BKA, seizures, admitted on 11/16/2023 with respiratory failure due to COVID and RSV pneumonia.  Workup also revealed an acute central left cerebellar infarct with no associated hemorrhage or mass effect.  She experienced a PEA arrest during dialysis on January 30 was intubated and extubated on February 9.  She has an NG tube in place.  On 215 she had a significant aspiration event from tube feedings and tube feedings were stopped.  She was transferred back to the ICU.  She has ongoing A-fib with RVR since the aspiration event.  She is high risk for decompensation and need for reintubation.  Palliative medicine consulted for goals of care.  Primary Decision Maker NEXT OF KIN spouse-Dr. Chalmers Guest, Jr.  Discussion: I have reviewed medical records including EPIC notes, labs and imaging, received report from RN, assessed the patient and then met at the bedside along with patient's husband to discuss diagnosis prognosis, GOC, EOL wishes, disposition and options.  I introduced Palliative Medicine as specialized medical care for people  living with serious illness. It focuses on providing relief from the symptoms and stress of a serious illness. The goal is to improve quality of life for both the patient and the family.  We discussed a brief life review of the patient and then focused on their current illness. The natural disease trajectory and expectations at EOL were discussed.  Patient is retired from working in [employer], [employer].  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  Prior to this admission she was at [facility] nursing facility and rehab after a recent hospitalization from 10/23/2017 for chest pain that was determined to be musculoskeletal however she had significant deconditioning.  Her spouse reports she had some improvements at [facility] was able to transfer with assist from bed to chair and could walk a few steps with a walker.  We reviewed her current hospitalization and the many complications that she has had.  She continues to be at significant risk for further decompensation.  Advance care planning  Advanced directives, concepts specific to code status, artifical feeding and hydration, and rehospitalization were considered and discussed.  Her spouse shares that she has been  through many traumatic experiences including history of BKA and CPR during this admission.  He does not feel that a PEG tube would be much worse than anything she has already been through. He is hopeful that she will not require reintubation and will not need to discuss tracheostomy. He is hopeful that her mental status will improve to a point where she can participate in these type of discussions. Discussed the importance of continued conversation with family and the medical providers regarding overall plan of care and treatment options, ensuring decisions are within the context of the patient's values and GOCs.   Spouse is aware that patient has had many setbacks and that her ability to recover to  the point where she was before this admission is unlikely and will be very prolonged.  He is agreeable to LTAC if needed.  Questions and concerns were addressed.  Hard Choices booklet left for review. The family was encouraged to call with questions or concerns.  PMT will continue to support holistically.   SUMMARY OF RECOMMENDATIONS -Patient with multiple comorbidities status post PEA arrest and CPR with intubation continues to be at high risk for further decompensation-spouse desires full scope of care at this point he is hopeful that his wife will be able to recover mental status to participate more in goals of care discussions -PMT will continue to follow  Code Status/Advance Care Planning:   Code Status: Full Code    Prognosis:   Unable to determine  Discharge Planning: To Be Determined  Primary Diagnoses: Present on Admission:  Pneumonia   Review of Systems  Physical Exam  Vital Signs: BP (!) 155/67   Pulse 99   Temp 98.2 F (36.8 C) (Axillary)   Resp (!) 34   Ht 5\' 3"  (1.6 m)   Wt 51.4 kg   SpO2 100%   BMI 20.07 kg/m  Pain Scale: Faces POSS *See Group Information*: 1-Acceptable,Awake and alert Pain Score: 0-No pain   SpO2: SpO2: 100 % O2 Device:SpO2: 100 % O2 Flow Rate: .O2 Flow Rate (L/min): 2 L/min  IO: Intake/output summary:  Intake/Output Summary (Last 24 hours) at 12/10/2023 1352 Last data filed at 12/10/2023 1300 Gross per 24 hour  Intake 648.72 ml  Output --  Net 648.72 ml    LBM: Last BM Date : 12/08/23 Baseline Weight: Weight: 79.5 kg Most recent weight: Weight: 51.4 kg       Thank you for this consult. Palliative medicine will continue to follow and assist as needed.  Time Total: 90 minutes Signed by: Ocie Bob, AGNP-C Palliative Medicine  Time includes:   Preparing to see the patient (e.g., review of tests) Obtaining and/or reviewing separately obtained history Performing a medically necessary appropriate examination and/or  evaluation Counseling and educating the patient/family/caregiver Ordering medications, tests, or procedures Referring and communicating with other health care professionals (when not reported separately) Documenting clinical information in the electronic or other health record Independently interpreting results (not reported separately) and communicating results to the patient/family/caregiver Care coordination (not reported separately) Clinical documentation   Please contact Palliative Medicine Team phone at [phone] for questions and concerns.  For individual provider: See Loretha Stapler

## 2023-12-10 NOTE — Progress Notes (Signed)
 Rounding Note    Patient Name: Carolyn Russo Date of Encounter: 12/10/2023  [facility] Cardiologist: Carolyn Si, MD   Subjective   No events overnight  Inpatient Medications    Scheduled Meds:   stroke: early stages of recovery book   Does not apply Once   amoxicillin-clavulanate  1 tablet Oral Q12H   arformoterol  15 mcg Nebulization BID   aspirin  81 mg Per Tube Daily   budesonide (PULMICORT) nebulizer solution  0.5 mg Nebulization BID   Chlorhexidine Gluconate Cloth  6 each Topical Q0600   Chlorhexidine Gluconate Cloth  6 each Topical Q0600   darbepoetin (ARANESP) injection - DIALYSIS  150 mcg Subcutaneous Q Fri-1800   ferrous sulfate  300 mg Per Tube Daily   heparin  2,500 Units Dialysis Once in dialysis   heparin injection (subcutaneous)  5,000 Units Subcutaneous Q8H   hydrALAZINE  50 mg Per Tube Q8H   insulin aspart  0-15 Units Subcutaneous Q4H   insulin glargine-yfgn  5 Units Subcutaneous Daily   levETIRAcetam  500 mg Per Tube BID   multivitamin  1 tablet Per Tube QHS   nutrition supplement (JUVEN)  1 packet Per Tube BID BM   mouth rinse  15 mL Mouth Rinse Q4H   rosuvastatin  10 mg Per Tube Daily   sodium chloride flush  3 mL Intravenous Q12H   ticagrelor  90 mg Per Tube BID   Continuous Infusions:  amiodarone 30 mg/hr (12/10/23 0900)   anticoagulant sodium citrate     feeding supplement (VITAL 1.5 CAL) Stopped (12/09/23 1226)   PRN Meds: acetaminophen **OR** acetaminophen, alteplase, anticoagulant sodium citrate, docusate, feeding supplement (NEPRO CARB STEADY), heparin, heparin, ipratropium-albuterol, labetalol, lidocaine (PF), lidocaine-prilocaine, ondansetron (ZOFRAN) IV, mouth rinse, pentafluoroprop-tetrafluoroeth, polyethylene glycol, white petrolatum, witch hazel-glycerin   Vital Signs    Vitals:   12/10/23 0631 12/10/23 0700 12/10/23 0754 12/10/23 0800  BP: (!) 109/52 108/75  117/76  Pulse: 86 90  93  Resp: (!) 32 (!) 24  (!) 26   Temp:   98.8 F (37.1 C)   TempSrc:   Axillary   SpO2: 100% 100%  100%  Weight:      Height:        Intake/Output Summary (Last 24 hours) at 12/10/2023 1016 Last data filed at 12/10/2023 0900 Gross per 24 hour  Intake 1093.46 ml  Output --  Net 1093.46 ml      12/09/2023   12:31 PM 12/09/2023    4:37 AM 12/08/2023    6:38 PM  Last 3 Weights  Weight (lbs) 113 lb 5.1 oz 163 lb 2.3 oz 155 lb 6.8 oz  Weight (kg) 51.4 kg 74 kg 70.5 kg      Telemetry    SR. Converted from afib around 330pm - Personally Reviewed  ECG     - Personally Reviewed  Physical Exam   GEN: No acute distress.   Neck: No JVD Cardiac: RRR, no murmurs, rubs, or gallops.  Respiratory: coarse bilaterally GI: Soft, nontender, non-distended  Carolyn: No edema; No deformity. Neuro:  Nonfocal  Psych: Normal affect   Labs    High Sensitivity Troponin:   Recent Labs  Lab 11/16/23 1044 11/16/23 1651 11/27/23 1840 11/27/23 1954  TROPONINIHS 205* 199* 38* 34*     Chemistry Recent Labs  Lab 12/07/23 0219 12/08/23 0352 12/09/23 0538  NA 132* 131* 131*  K 3.9 4.5 4.4  CL 90* 91* 94*  CO2 28 27 25  GLUCOSE 196* 205* 201*  BUN 25* 55* 31*  CREATININE 2.64* 3.75* 2.76*  CALCIUM 9.0 9.1 8.9  MG  --   --  2.0  ALBUMIN 2.0* 2.1* 2.1*  GFRNONAA 18* 12* 17*  ANIONGAP 14 13 12     Lipids No results for input(s): "CHOL", "TRIG", "HDL", "LABVLDL", "LDLCALC", "CHOLHDL" in the last 168 hours.  Hematology Recent Labs  Lab 12/07/23 0219 12/08/23 0352 12/09/23 0538  WBC 17.1* 16.3* 15.2*  RBC 3.10* 3.20* 3.07*  HGB 9.3* 9.6* 9.2*  HCT 27.2* 28.0* 27.4*  MCV 87.7 87.5 89.3  MCH 30.0 30.0 30.0  MCHC 34.2 34.3 33.6  RDW 16.8* 16.5* 16.8*  PLT 456* 473* 472*   Thyroid No results for input(s): "TSH", "FREET4" in the last 168 hours.  BNPNo results for input(s): "BNP", "PROBNP" in the last 168 hours.  DDimer No results for input(s): "DDIMER" in the last 168 hours.   Radiology    DG CHEST PORT 1  VIEW Result Date: 12/09/2023 CLINICAL DATA:  16109 Respiratory failure [facility]) [phone] [id number] Atrial fibrillation with RVR (HCC) [id number] EXAM: PORTABLE CHEST 1 VIEW COMPARISON:  December 06, 2023 FINDINGS: The cardiomediastinal silhouette is unchanged in contour. The enteric tube courses through the chest to the abdomen beyond the field-of-view. RIGHT chest CVC with tip terminating over the RIGHT atrium. Small bilateral pleural effusions. No pneumothorax. Increased platelike opacity at the RIGHT lung base. Persistent LEFT retrocardiac opacity. IMPRESSION: Small bilateral pleural effusions with increased platelike opacity at the RIGHT lung base, favored to reflect atelectasis. Differential considerations include superimposed aspiration or infection. Electronically Signed   By: Carolyn Russo M.D.   On: 12/09/2023 11:27     Patient summary  Carolyn Russo is 73 y.o. female hypertension, hyperlipidemia, diabetes mellitus, PVD (status post laser atherectomy with right posterior tibial stent 03/12/2022 and subsequent right BKA in 2024) PAD, CAD (cath 2018 with 30 to 40% proximal LAD), CVA, end-stage renal disease on HD, COPD, history of GI bleed and SVT on Zio patch 2024 with 2 pauses due to complete heart block lasting 5.8 seconds).    She was admitted at the end of December for a week due to complaints of chest pain and was felt to be musculoskeletal nature at that time. 2D echo at that time showed EF 55 to 60% with moderate LVH and mild pulmonary hypertension. She had mild troponin elevation at that time at 66 and 57. Cardiology was not consulted at that time    She presented 11/16/2023 with complaints of chest pain to the ER. Apparently was found on the floor by SNF after fall and out of bed and complained of chest pain as well as shoulder pain and was sent to the ER. In the ER sodium 136, potassium 3.3, serum creatinine 4.79, high-sensitivity troponin 205, hemoglobin 9.9. EKG showed inferior infarct age  undetermined with no acute ST changes. Chest x-ray showed vascular congestion. She was hypoxic on arrival with O2 sat 88% on room air. Chest CTA is pending for evaluation for acute PE. Cardiology initally asked to evaluate due to elevated troponin and atypical chest pain.    On 01/30, she became hypotensive w/ HD and had PEA arrest req CPR. After evaluation, event not felt cardiac in nature.   Although trop peak 205, in the setting of acute illness and known mild CAD, no ischemic eval planned. Volume mgt per HD.    On 02/05, no further cardiac workup indicated, and pt encephalopathic w/ new and acute central and  L cerebellar infarct on MRI. Cards signed off.   Cards reconsulted 02/15 due to atrial fib, RVR.  Assessment & Plan    1.Afib - new onset this admission -following emesis developed respiratory distress, went into afib with RVR - transferred to ICU given respiratory distress secondary to aspiration - initially started on IV dilt gtt, limited dosing due to low bp's. Drip at 15 with SBP 90s, rates still 130s.  - we stopped diltiazem and started amiodarone IV.  - no prior known afib, looks to be exacerbated by her acute respiratory event. At this time would not commit to long term anticoagulation.   -08/2023 monitor complete heart block episodes up to 5.8 seconds, was to see EP as outpatient. Beta blocker stopped. Monitor rhythm closely on amio.    - converted to SR around 330pm yesterday after being on amio for about 1 hour. - complete 24 hour load. Would not plan for long term amio, looks to have been isolated event driven by respiratory distress. Transition to oral amio 200mg  bid x 7 days then discontinue. Have not committed to anticoag as appears to be isolated short event.  **speech seeing now, plan as above if ok to take pills. If not would just d/c IV amio and monitor for any recurrence.    2. PEA - earlier this admission, from notes hypotensive during HD and suffered PEA  arrest - from prior cardiology notes given PEA arrest and no significant findings by trops, EKG, or echo thought noncardiac arrest and no further cardiac testing was planned - post arrest course complicated by encephalopathy, prolonged intubation       3. CAD - LHC in 2018 revealed heavily calcified 30-40% proximal LAD disease  - no acute issues this admission   4. Encephalopathy - issues this admit with AMS - from notes secondary to post-arrest, stroke, metabolic derangements, and drug induced    5. Pneumonia COVID/RSV - per primary team   6. ESRD   No additional cardiology recs at this time, would plan for 7 days of amiodarone 200mg  bid and then discontinue. We will sign off inpatient care at this time.    For questions or updates, please contact [facility] Please consult [email or web address] for contact info under        Signed, Dina Rich, MD  12/10/2023, 10:16 AM

## 2023-12-10 NOTE — Progress Notes (Signed)
  [facility] Progress Note   Subjective:    CXR 2/15 showed no congestion or edema, no infiltrates, small effusion Wt's are off, will request another Pt awake, cortrak in place  Objective Vitals:   12/10/23 0800 12/10/23 1000 12/10/23 1015 12/10/23 1030  BP: 117/76  (!) 144/109 137/62  Pulse: 93 98 92 87  Resp: (!) 26 (!) 40 (!) 28 (!) 25  Temp:      TempSrc:      SpO2: 100% 96% 100% 100%  Weight:      Height:       Physical Exam General: Chronically ill-appearing Heart: S1 and S2; No murmurs, gallops, or rubs Lungs: Coarse anterior rhonchi bilaterally Abdomen: Soft and non-tender Extremities:No LE edema Dialysis Access: R femoral TDC   Dialysis Orders: MWF NW  4h   B350    75.5kg   2/2 bath   R fem TDC  Heparin 2600 + 1400 midrun - last HD 1/22, getting to dry wt - venofer 100 qhd thru 1/31 - mircera 150 q2, last 1/17, due 1/31  Renal-related home meds: - rocaltrol 0.5 mwf - sensipar 30 every day - hydralazine 100 tid - losartan 50 bid - renvela 800mg  ac tid - others: brilinta, crestor, keppra, ritalin, neurontin, asa, PPI, dulera, insulin, albuterol, elavil  CXR 2/15 - no change, small R effusion, no edema/ congestion / infiltrates   Assessment/Plan: AHRF/ +COVID +RSV pna/ volume^: SP course of abx + HD. Extubated 2/09 . Per CCM.  CVA: R then L cerebellar infarcts d/t small vessel disease. Per neurology.  On  DAPT AMS: multifactorial. Has improved over last week.  Aspiration pneumonitis - CXR clear 2/15. Triggered Afib RVR. Moved back to ICU. ESRD: on HD MWF. HD Monday.  BP: bp's labile, 95/70- 130/ 50 Volume: BP's labile 95- 135/ 48- 75. No edema. CXR clear 2/15. Will try 1- 2 L w/ next HD if SBP's remains > 100- 105 Anemia of esrd: Hb is down to 8-9 range. Weekly sq darbe qFri. Tsat 26% and ferritin 1638. Pharm rec'd po ferrous sulfate daily.  Transfuse as needed Secondary hyperparathyroidism - phos in range. Calcium up when corrected,  not on vdra.  Resume binders when taking po H/o seizure d/o - on Keppra PEA arrest  Vinson Moselle  MD  [facility] 12/10/2023, 10:56 AM  Recent Labs  Lab 12/08/23 0352 12/09/23 0538  HGB 9.6* 9.2*  ALBUMIN 2.1* 2.1*  CALCIUM 9.1 8.9  PHOS 2.3* 2.0*  CREATININE 3.75* 2.76*  K 4.5 4.4    Inpatient medications:   stroke: early stages of recovery book   Does not apply Once   amiodarone  200 mg Oral BID   amoxicillin-clavulanate  1 tablet Oral Q12H   arformoterol  15 mcg Nebulization BID   aspirin  81 mg Per Tube Daily   budesonide (PULMICORT) nebulizer solution  0.5 mg Nebulization BID   Chlorhexidine Gluconate Cloth  6 each Topical Q0600   Chlorhexidine Gluconate Cloth  6 each Topical Q0600   darbepoetin (ARANESP) injection - DIALYSIS  150 mcg Subcutaneous Q Fri-1800   ferrous sulfate  300 mg Per Tube Daily   heparin  2,500 Units Dialysis Once in dialysis   heparin injection (subcutaneous)  5,000 Units Subcutaneous Q8H   hydrALAZINE  50 mg Per Tube Q8H   insulin aspart  0-15 Units Subcutaneous Q4H   insulin glargine-yfgn  5 Units Subcutaneous Daily   levETIRAcetam  500 mg Per Tube BID   multivitamin  1  tablet Per Tube QHS   nutrition supplement (JUVEN)  1 packet Per Tube BID BM   mouth rinse  15 mL Mouth Rinse Q4H   rosuvastatin  10 mg Per Tube Daily   sodium chloride flush  3 mL Intravenous Q12H   ticagrelor  90 mg Per Tube BID    amiodarone 30 mg/hr (12/10/23 1000)   anticoagulant sodium citrate     feeding supplement (VITAL 1.5 CAL) Stopped (12/09/23 1226)   acetaminophen **OR** acetaminophen, alteplase, anticoagulant sodium citrate, docusate, feeding supplement (NEPRO CARB STEADY), heparin, heparin, ipratropium-albuterol, labetalol, lidocaine (PF), lidocaine-prilocaine, ondansetron (ZOFRAN) IV, mouth rinse, pentafluoroprop-tetrafluoroeth, polyethylene glycol, white petrolatum, witch hazel-glycerin

## 2023-12-10 NOTE — Progress Notes (Signed)
 RN noticed increased swelling to LUE around where unasyn (LAC) and amiodarone (LFA) were infusing into PIV's. Unclear as to which PIV was infiltrated therefore both IV's removed. LUE elevated on multiple pillows and warm compresses applied. Pharmacist Veronda notified of infiltrations, plan for now is to elevated extremity and apply dry heat.

## 2023-12-10 NOTE — Progress Notes (Signed)
 eLink Physician-Brief Progress Note Patient Name: Carolyn Russo DOB: 1951/09/01 MRN: [id number]   Date of Service  12/10/2023  HPI/Events of Note  Bedside RN requested Flexi-Seal for patient.  She is having multiple bouts of diarrhea.  eICU Interventions  Chart briefly reviewed.  Spoke to bedside nurse.  Flexi-Seal ordered.     Intervention Category Minor Interventions: Other:  Carilyn Goodpasture 12/10/2023, 8:46 PM

## 2023-12-10 NOTE — Progress Notes (Signed)
 RT attempted metaneb with pt for CPT but pt could not follow commands to hold device in mouth correctly.  RT will attempt vest.

## 2023-12-11 ENCOUNTER — Encounter: Payer: Medicare PPO | Admitting: Physical Medicine and Rehabilitation

## 2023-12-11 ENCOUNTER — Inpatient Hospital Stay (HOSPITAL_COMMUNITY): Payer: Medicare PPO

## 2023-12-11 DIAGNOSIS — I469 Cardiac arrest, cause unspecified: Secondary | ICD-10-CM | POA: Diagnosis not present

## 2023-12-11 LAB — BASIC METABOLIC PANEL
Anion gap: 17 — ABNORMAL HIGH (ref 5–15)
BUN: 66 mg/dL — ABNORMAL HIGH (ref 8–23)
CO2: 23 mmol/L (ref 22–32)
Calcium: 9.7 mg/dL (ref 8.9–10.3)
Chloride: 93 mmol/L — ABNORMAL LOW (ref 98–111)
Creatinine, Ser: 5.39 mg/dL — ABNORMAL HIGH (ref 0.44–1.00)
GFR, Estimated: 8 mL/min — ABNORMAL LOW (ref >60)
Glucose, Bld: 233 mg/dL — ABNORMAL HIGH (ref 70–99)
Potassium: 5.2 mmol/L — ABNORMAL HIGH (ref 3.5–5.1)
Sodium: 133 mmol/L — ABNORMAL LOW (ref 135–145)

## 2023-12-11 LAB — GLUCOSE, CAPILLARY
Glucose-Capillary: 131 mg/dL — ABNORMAL HIGH (ref 70–99)
Glucose-Capillary: 130 mg/dL — ABNORMAL HIGH (ref 70–99)
Glucose-Capillary: 215 mg/dL — ABNORMAL HIGH (ref 70–99)
Glucose-Capillary: 204 mg/dL — ABNORMAL HIGH (ref 70–99)
Glucose-Capillary: 238 mg/dL — ABNORMAL HIGH (ref 70–99)

## 2023-12-11 LAB — MAGNESIUM: Magnesium: 2.2 mg/dL (ref 1.7–2.4)

## 2023-12-11 MED ORDER — DEXMEDETOMIDINE HCL IN NACL 400 MCG/100ML IV SOLN
INTRAVENOUS | Status: AC
  Administered 2023-12-11: 0.4 ug/kg/h via INTRAVENOUS
  Filled 2023-12-11: qty 100

## 2023-12-11 MED ORDER — RACEPINEPHRINE HCL 2.25 % IN NEBU
INHALATION_SOLUTION | RESPIRATORY_TRACT | Status: AC
  Filled 2023-12-11: qty 0.5

## 2023-12-11 MED ORDER — DARBEPOETIN ALFA 150 MCG/0.3ML IJ SOSY
150.0000 ug | PREFILLED_SYRINGE | INTRAMUSCULAR | Status: DC
  Administered 2023-12-11 – 2023-12-18 (×2): 150 ug via SUBCUTANEOUS
  Filled 2023-12-11 (×2): qty 0.3

## 2023-12-11 MED ORDER — DEXAMETHASONE SODIUM PHOSPHATE 10 MG/ML IJ SOLN
4.0000 mg | Freq: Four times a day (QID) | INTRAMUSCULAR | Status: AC
  Administered 2023-12-11 (×4): 4 mg via INTRAVENOUS
  Filled 2023-12-11 (×4): qty 1

## 2023-12-11 MED ORDER — RACEPINEPHRINE HCL 2.25 % IN NEBU
0.5000 mL | INHALATION_SOLUTION | RESPIRATORY_TRACT | Status: DC | PRN
  Administered 2023-12-11 – 2023-12-12 (×2): 0.5 mL via RESPIRATORY_TRACT
  Filled 2023-12-11 (×2): qty 0.5

## 2023-12-11 MED ORDER — DEXMEDETOMIDINE HCL IN NACL 400 MCG/100ML IV SOLN
0.2000 ug/kg/h | INTRAVENOUS | Status: DC
  Filled 2023-12-11: qty 100

## 2023-12-11 NOTE — Progress Notes (Signed)
 Elink RN notified of increased work of breathing, tachypnea with accessory muscle use. Patient's sats 99-100% with a good waveform on 2L [place]. Patient grabbed hand and voiced "I'm dying". Elink RN Ernie Hew to notify elink MD.

## 2023-12-11 NOTE — Progress Notes (Signed)
 Nutrition Follow-up  DOCUMENTATION CODES:  Not applicable  INTERVENTION:  Continue TF via cortrak (recommend post-pyloric placement if able): Vital 1.5 at 20 ml/h, increase by 10 ml every 8 hours to goal rate of 45 ml/h (1080 ml per day) Provides 1620 kcal, 73 gm protein, 825 ml free water daily Juven BID, each packet provides 95 calories, 2.5 grams of protein (collagen), and micronutrients to support wound healing Renal MVI daily  NUTRITION DIAGNOSIS:  Inadequate oral intake related to acute illness as evidenced by NPO status. - remains applicable   GOAL:  Patient will meet greater than or equal to 90% of their needs - unmet, TF off 2/15, resumed 2/17 at 20 ml/h   MONITOR:  TF tolerance, I & O's, Labs, Weight trends  REASON FOR ASSESSMENT:  Ventilator    ASSESSMENT:  Pt  presented 1/23 with confusion and worsening L chest pain secondary to COVID and RSV infection. PMH significant for ESRD on HD, CHF, CVA with residual L sided weakness, T2DM, R BKA, seizure disorder.  1/23: MRI brain- revealed 1.3 cm acute to early subacute R PICA distribution infarct 1/28: MBS recommend dysphagia 2, thin liquids 1/30: PEA arrest; intubated; NPO  2/4: MRI brain- resolution of small R PICA cerebellar infarct, new/acute central L cerebellar infarct with no associated hemorrhage   2/9 - extubated 2/15 - transferred back to ICU after aspiration event  Pt resting in bed at the time of RD visit. No visitors present at this time. Cortrak in place and TF infusing at 20 ml/h. Per discussion with RN, patient vomited and aspirated 2/15, so TF was turned off, TF resumed this morning with Vital 1.5 at 20 ml/h.   S/P SLP evaluation today. Patient not following commands and concerns for decreased airway protection, so SLP recommends continue NPO status. Would benefit from FEES to evaluate swallow function.  Weight stable for the past week, trending back up since admission, but still below admission weight.    Admit weight: 79.5 kg   Current weight: 73.6 kg EDW prior to admission: 75.5 kg (nephrology notes several drops in dry wt over the last 3 weeks)   Intake/Output Summary (Last 24 hours) at 12/11/2023 1213 Last data filed at 12/11/2023 1000 Gross per 24 hour  Intake 118.85 ml  Output --  Net 118.85 ml  Net IO Since Admission: 4,916.19 mL [12/11/23 1213]  Drains/Lines: Cortrak (distal stomach) Right AV fistula  Last HD 2/14 with 1,500 ml fluid removed.  Medications reviewed and include aranesp, decadron, ferrous sulfate, novolog, semglee, rena-vit, Juven.  Labs reviewed. Na 131 CBG: 131-130-215  Diet Order:   Diet Order             Diet NPO time specified  Diet effective now                  EDUCATION NEEDS:  No education needs have been identified at this time  Skin:  Skin Assessment: Skin Integrity Issues: Skin Integrity Issues:: Stage II Stage II: medial sacrum  Last BM:  2/17 type 7  Height:  Ht Readings from Last 1 Encounters:  11/28/23 5\' 3"  (1.6 m)    Weight:  Wt Readings from Last 1 Encounters:  12/11/23 73.6 kg    Ideal Body Weight:  52.3 kg  BMI:  Body mass index is 28.74 kg/m.  Estimated Nutritional Needs:  Kcal:  1600-1800 kcal/d Protein:  80-100 g/d Fluid:  1L + UOP   Gabriel Rainwater RD, LDN, CNSC Contact via secure chat.  If unavailable, use group chat "RD Inpatient."

## 2023-12-11 NOTE — Progress Notes (Signed)
 2000: Elink notified of assessment findings: L pupil >R, left eye appearing more droopy than R, and left arm flaccid. Pt moving right arm spontaneously. These findings were not noted in previous assessments. Discussed with Elink provider about findings as well as prior hx of strokes. Order received for STAT head CT.   2050: Pt transported to CT and back to 33m08 without complication.

## 2023-12-11 NOTE — Progress Notes (Signed)
 eLink Physician-Brief Progress Note Patient Name: Carolyn Russo DOB: 10/16/1951 MRN: [id number]   Date of Service  12/11/2023  HPI/Events of Note  Pt with prior hx of strokes but RN is reporting that pupils are anisocoric, L arm is flaccid.  Right arm is moving spontaneously and purposeful.   eICU Interventions  Get CT heat stat.      Intervention Category Intermediate Interventions: Change in mental status - evaluation and management  Larinda Buttery 12/11/2023, 8:10 PM  3:19 AM CT head 1. No acute intracranial abnormality. 2. Old small vessel infarct of the right basal ganglia and findings of chronic microvascular disease. 3. Bilateral mastoid and middle ear effusions.  Review of prior notes indicate that the patient has CVA with left sided weakness.   Plan> Continue to monitor.

## 2023-12-11 NOTE — Progress Notes (Signed)
 eLink Physician-Brief Progress Note Patient Name: Carolyn Russo DOB: 07-Feb-1951 MRN: [id number]   Date of Service  12/11/2023  HPI/Events of Note  Asked to evaluate patient with respiratory difficulties.  She is a lady who had cardiac arrest was admitted to ICU from dialysis intubated, extubated and transferred out.  She was transferred back to the ICU after an aspiration event with A-fib/RVR.  eICU Interventions  Chart reviewed.  Video assessment of patient done.  She is labored in her breathing and has audible stridor.  Racemic epinephrine and IV steroids ordered.  Contacted intensivist on the ground to evaluate patient.      Intervention Category Major Interventions: Respiratory failure - evaluation and management  Carilyn Goodpasture 12/11/2023, 12:58 AM

## 2023-12-11 NOTE — TOC Progression Note (Signed)
 Transition of Care [facility]) - Progression Note    Patient Details  Name: Carolyn Russo MRN: [id number] Date of Birth: Aug 16, 1951  [facility]) CM/SW Contact  Tom-Johnson, Hershal Coria, RN Phone Number: 12/11/2023, 3:32 PM  Clinical Narrative:     CM spoke with MD about possible LTACH. MD states patient is improving and might not need LTACH.  TOC to continue to pursue SNF at this time.        Expected Discharge Plan: Skilled Nursing Facility    Expected Discharge Plan and Services In-house Referral: Clinical Social Work     Living arrangements for the past 2 months: Single Family Home, Skilled Nursing Facility                                       Social Determinants of Health (SDOH) Interventions SDOH Screenings   Food Insecurity: No Food Insecurity (11/17/2023)  Housing: Low Risk  (11/17/2023)  Transportation Needs: No Transportation Needs (11/17/2023)  Utilities: Not At Risk (11/17/2023)  Alcohol Screen: Low Risk  (01/09/2019)  Depression (PHQ2-9): Low Risk  (09/11/2023)  Financial Resource Strain: Low Risk  (09/02/2021)  Physical Activity: Inactive (09/02/2021)  Social Connections: Socially Integrated (11/17/2023)  Stress: Stress Concern Present (09/02/2021)  Tobacco Use: Low Risk  (11/16/2023)    Readmission Risk Interventions    02/14/2023    4:54 PM  Readmission Risk Prevention Plan  Transportation Screening Complete  PCP or Specialist Appt within 3-5 Days Complete  HRI or Home Care Consult Complete  Social Work Consult for Recovery Care Planning/Counseling Complete  Palliative Care Screening Complete  Medication Review [employer]) Complete

## 2023-12-11 NOTE — Progress Notes (Signed)
  [facility] Progress Note   Subjective:    Pt awake, cortrak in place  Objective Vitals:   12/11/23 1130 12/11/23 1145 12/11/23 1200 12/11/23 1215  BP:      Pulse: 89 95 91 (!) 105  Resp: 20 20 (!) 27 (!) 37  Temp:      TempSrc:      SpO2: 100% 98% 100% 96%  Weight:      Height:       Physical Exam General: Chronically ill-appearing Heart: S1 and S2; No murmurs, gallops, or rubs Lungs: rhonchi bilat are less dramatic today Abdomen: Soft and non-tender Extremities:No LE edema Dialysis Access: R femoral TDC   Dialysis Orders: MWF NW  4h   B350    75.5kg   2/2 bath   R fem TDC  Heparin 2600 + 1400 midrun - last HD 1/22, getting to dry wt - venofer 100 qhd thru 1/31 - mircera 150 q2, last 1/17, due 1/31  Renal-related home meds: - rocaltrol 0.5 mwf - sensipar 30 every day - hydralazine 100 tid - losartan 50 bid - renvela 800mg  ac tid - others: brilinta, crestor, keppra, ritalin, neurontin, asa, PPI, dulera, insulin, albuterol, elavil  CXR 2/15 - no change, small R effusion, no edema/ congestion / infiltrates   Assessment/Plan: AHRF/ +COVID +RSV pna/ volume^: SP course of abx + HD. Extubated 2/09 . Per CCM.  CVA: R then L cerebellar infarcts d/t small vessel disease. Per neurology.  On  DAPT AMS: multifactorial. Has improved over last week.  Aspiration pneumonitis - CXR clear 2/15. Triggered Afib RVR. Moved back to ICU. ESRD: on HD MWF. HD today/ tonight, possibly tomorrow depending on staffing.  BP: bp's labile, 95/70- 130/ 50 Volume: BP's labile 95- 135/ 48- 75. CXR clear 2/15. Plan UF 1- 2 L w/ next HD if SBP's remains > 100- 105 Anemia of esrd: Hb is down to 8-9 range. Weekly sq darbe qFri. Tsat 26% and ferritin 1638. Pharm rec'd po ferrous sulfate daily.  Transfuse as needed Secondary hyperparathyroidism - phos in range. Calcium up when corrected, not on vdra.  Resume binders when taking po H/o seizure d/o - on Keppra PEA arrest  Vinson Moselle  MD  [facility] 12/11/2023, 3:02 PM  Recent Labs  Lab 12/08/23 0352 12/09/23 0538  HGB 9.6* 9.2*  ALBUMIN 2.1* 2.1*  CALCIUM 9.1 8.9  PHOS 2.3* 2.0*  CREATININE 3.75* 2.76*  K 4.5 4.4    Inpatient medications:  amiodarone  200 mg Per Tube BID   amoxicillin-clavulanate  1 tablet Per Tube Q12H   arformoterol  15 mcg Nebulization BID   aspirin  81 mg Per Tube Daily   budesonide (PULMICORT) nebulizer solution  0.5 mg Nebulization BID   Chlorhexidine Gluconate Cloth  6 each Topical Q0600   Chlorhexidine Gluconate Cloth  6 each Topical Q0600   Chlorhexidine Gluconate Cloth  6 each Topical Q0600   darbepoetin (ARANESP) injection - DIALYSIS  150 mcg Subcutaneous Q Mon-1800   dexamethasone (DECADRON) injection  4 mg Intravenous Q6H   ferrous sulfate  300 mg Per Tube Daily   heparin injection (subcutaneous)  5,000 Units Subcutaneous Q8H   hydrALAZINE  50 mg Per Tube Q8H   insulin aspart  0-15 Units Subcutaneous Q4H   insulin glargine-yfgn  5 Units Subcutaneous Daily   levETIRAcetam  500 mg Per Tube BID   multivitamin  1 tablet Per Tube QHS   nutrition supplement (JUVEN)  1 packet Per Tube BID BM  mouth rinse  15 mL Mouth Rinse Q4H   rosuvastatin  10 mg Per Tube Daily   sodium chloride flush  3 mL Intravenous Q12H   ticagrelor  90 mg Per Tube BID    anticoagulant sodium citrate     dexmedetomidine (PRECEDEX) IV infusion Stopped (12/11/23 0748)   feeding supplement (VITAL 1.5 CAL) 20 mL/hr at 12/11/23 1200   acetaminophen **OR** acetaminophen, alteplase, anticoagulant sodium citrate, docusate, feeding supplement (NEPRO CARB STEADY), heparin, heparin, ipratropium-albuterol, labetalol, lidocaine (PF), lidocaine-prilocaine, ondansetron (ZOFRAN) IV, mouth rinse, pentafluoroprop-tetrafluoroeth, polyethylene glycol, Racepinephrine HCl, white petrolatum, witch hazel-glycerin

## 2023-12-11 NOTE — Progress Notes (Signed)
 NAME:  Carolyn Russo, MRN:  [id number], DOB:  Feb 23, 1951, LOS: 25 ADMISSION DATE:  11/16/2023, CONSULTATION DATE:  1/30 REFERRING MD:  TRH, CHIEF COMPLAINT:  code blue   History of Present Illness:  Carolyn Russo is a 73 yo female She presented to [facility] on 1/23 with confusion, left sided chest pain, shortness of breath, and coughing. She was found to be hypoxic on room air and placed on 2 L [place]. CT chest was done without evidence of PE.  MRI revealed a 1.3cm acute to early subacute right PICA infarct. Also was admitted with pneumonia due to COVID and RSV and was started on antibiotics for likely superimposed bacterial infection.    On 1/30 early morning the patient was ~2 hrs into dialysis when her blood pressure dropped. UF goal was decreased but since the patient's level of consciousnesses decreased, she received 300 cc NS bolus. EKG changes were noted and rapid response was activated, dialysis stopped, and blood returned to the patient. Around 6:30 AM the patient was noted to be apneic without a pulse and code blue was called. Patient was noted to have PEA arrest. CPR was done for a few minutes before ROSC was achieved. She was transferred to 79M and patient was intubated due to failure to protect airway.  Pertinent  Medical History  ESRD/ HD MWF CHF CVA with residual LEFT sided weakness T2DM RIGHT BKA Seizures  Significant Hospital Events: Including procedures, antibiotic start and stop dates in addition to other pertinent events   1/23 admit to [facility] for acute infarct + covid/rsv pneumonia 1/30 admit to ICU after patient had PEA arrest  2/1 Failed SBT due to apnea. ABG with respiratory alkalosis. RR decreased 2/2 Tolerating SBT. Remains encephalopathic 2/5 Continues to tolerate SBT during day, but remains encephalopathic/unable to protect airway if extubated  2/9 extubated, off precedex 2/12 still lethargic, ABG 2/11 unremarkable, limit centrally acting meds 2/15 transferred back to ICU  for atrial fibrillation with rapid ventricular response that developed after a gross aspiration event.  Interim History / Subjective:  Monitor shows sinus rhythm.  On amiodarone.  Awake, follows some commands.  Intermittently verbalizes single words.  Cough seems a bit stronger today but still requiring suctioning.  Objective   Blood pressure (!) 160/67, pulse 88, temperature 98.3 F (36.8 C), temperature source Axillary, resp. rate 13, height 5\' 3"  (1.6 m), weight 73.6 kg, SpO2 100%.    FiO2 (%):  [32 %] 32 %   Intake/Output Summary (Last 24 hours) at 12/11/2023 4098 Last data filed at 12/11/2023 0800 Gross per 24 hour  Intake 162.42 ml  Output --  Net 162.42 ml   Filed Weights   12/09/23 1231 12/10/23 1500 12/11/23 0500  Weight: 51.4 kg 72.6 kg 73.6 kg    Examination: Gen:      Acute distress, increased work of breathing Lungs:    course breath sounds CV:         regularly irregular, tachycardic Abd:      + bowel sounds; soft, non-tender, no distension Ext:    No edema, right BKA Skin:      Warm and dry; no rash Neuro:  awake, spontaneously moving  CXR with left lower lobe atelectasis and right basilar opacities  Resolved Hospital Problem list   Hypokalemia Hypotension  Assessment & Plan:  Atrial Fibrillation with RVR, currently in normal sinus rhythm PEA arrest: due to hypotension, not primarily cardiac in nature  Cardiology input appreciated started on amiodarone (2/15)  Acute Hypoxemic Respiratory Failure Aspiration pneumonia/pneumonitis Atelectasis Pneumonia due to COVID and RSV Productive Cough  Continue with NT suction PRN Guaifenesin Q6H  Chest PT Start tube feeds.  Hyponatremia, mild Likely related to aspiration pneumonia Monitor serum Na  Acute encephalopathy: secondary to post-arrest, stroke, metabolic derangements, and drug induced. Concern for cefepime toxicity given her hx of ESRD, med was discontinued 2/4. MRI brain did show a new and acute  central left cerebellar infarct.  - Appreciate neurology recs.  Stroke not felt to be significant enough to explain altered mental status - Stopped Seroquel and amitriptyline - Limit centrally acting medications as possible - Last ABG without hypercarbia  - 2/12 CXR with pulmonary edema on HD  ESRD on HD MWF - Dialysis per nephro - Discontinued Renvela due to low phos levels  Sinusitis  - MRI brain noted progressed mastoid effusions - no fever overnight, WBC trending down - Augmentin BID x 10 days (renal dosed).  Change IV to NG formulation (2/16). - trend fever curve  Acute to early subacute right PICA infarct on 1/24, resolved New acute central left cerebellar infarct  - Appreciate neuro follow up  - Continue DAPT with aspirin and brilinta - Continue statin - Needs 30d heart monitor at discharge  Hypertension Hypotensive initially, but now normotensive Home hydralazine at reduced dose, monitor  Nephrology help appreciated.   Abnormal C6 signal -MRI showed a nonspecific bone marrow signal of c6 vertebral body.  Unclear if related to a malignancy vs discitis/osteomyelitis.  Will hold off on C spine imaging, as family does not want to pursue this workup and priority is mental status/neuro recovery.  - If no other findings, consider C spine MRI to further evaluate  Seizure disorder - Continue home Keppra 500 mg BID  Anemia of chronic disease - 1 unit blood on 2/10, Hgb stable - trend CBC - Aranesp every Friday   Diabetes - Continue SSI - Tube feeds   Best Practice (right click and "Reselect all SmartList Selections" daily)   Diet/type: tubefeeds  DVT prophylaxis prophylactic heparin  Pressure ulcer(s): pressure ulcer assessment deferred  GI prophylaxis: H2B Lines: R subclavian HD cath,  Foley:  N/A Code Status:  full code Last date of multidisciplinary goals of care discussion [updated husband, wishes full code. Open to talking with palliative care]   Critical  care time: - minutes   Marcelle Smiling, MD Board Certified by the ABIM, Pulmonary Diseases & Critical Care Medicine  PCCM on call pager [phone] until 7pm. Please call Elink 7p-7a. [phone]

## 2023-12-11 NOTE — Progress Notes (Signed)
 PT Cancellation Note  Patient Details Name: Carolyn Russo MRN: [id number] DOB: 06/10/1951   Cancelled Treatment:    Reason Eval/Treat Not Completed: Fatigue/lethargy limiting ability to participate  Unable to wake patient to a level of meaningful participation for physical therapy session. Not following commands, keeps eyes closed. Nurse notified that PT will hold today.   Please secure chat myself of [facility] PT if patient becomes more alert and able to tolerate physical activity/functional training. Will check back on status this week and progress as tolerated.    Kathlyn Sacramento, PT, DPT [facility]  Rehabilitation Services Physical Therapist Office: [phone] Website: [email or web address]   Berton Mount 12/11/2023, 2:03 PM

## 2023-12-11 NOTE — Consult Note (Addendum)
 WOC Nurse Consult Note: Reason for Consult: deep tissue injury to sacral/buttocks area.   Wound type: Unstageable pressure injury on sacrum. Applied Flex seal today.  Pressure Injury POA: No  Measurement:  8 cm x 7 cm x 0.1 cm Discoloration with 0.3 cm round areas of peeling epithelium.  Wound bed: Eschar yellow covering 90% of the wound bed, 10% red. Drainage (amount, consistency, odor) minimal serous, no odor. Periwound: maroon discoloration. Performed the square technique debridement at bed side.  Dressing procedure/placement/frequency: Cleanse sacral wound with Vashe (lawson [id number]), do not rinse.  Apply Xeroform, change daily and cover with sacral foam dressing, change every 3 days or PRN. Obs: IS on low air loss mattress.   Will follow weekly.   Thank-you,  Denyse Amass BSN, RN, [employer], WOC  (Pager: [phone])

## 2023-12-11 NOTE — Progress Notes (Signed)
 Speech Language Pathology Treatment: Dysphagia  Patient Details Name: Carolyn Russo MRN: [id number] DOB: 20-Jan-1951 Today's Date: 12/11/2023 Time: 4098-1191 SLP Time Calculation (min) (ACUTE ONLY): 10 min  Assessment / Plan / Recommendation Clinical Impression  Pt seen for po trials who is not following commands and perseverating on pt's badge "speech therapist". She is demonstrating concern for decreased airway protection. Dysphonia present and verbalizations are more of a whisper and could not achieve phonation when requested. At baseline her work of breathing is labored with slight wheeze which increased with applesauce trials. She consumed approximately 5 bites applesauce with immediate cough with last bite. RR lower than documented with SLP yesterday and remained around 28. Pt would benefit from instrumental assessment with FEES to further evaluate swallow function given 10 day intubation on 2/9. Continue NPO with oral care.    HPI HPI: Pt is a 73 y.o. female with who presented from SNF on 11/16/23 for chest pain, weakness and confusion. CT head revealed infarct in inferior right cerebellum which was new since 06/12/22. MRI brain with acute right inferomedial cerebellar ischemic infarction in the right PICA territory. Pt also diagnosed with COVID/RSV. On 2/15  transferred back to ICU for atrial fibrillation with rapid ventricular response that developed after a gross aspiration event from vomiting. MBS 1/28 DIGEST swallow severity rating of 1, mild. Recommended dys2/thin liquids. Secondary swallows needed to clear pharyngeal residue. Experienced cardiac arrest on 1/30 and was intubated until 2/9 (ten days) and remained NPO thereafter with cortrak. PMH: CVA in 2022 and 2023, right BKA, HTN, DM-2, HLD, ESRD on HD MWF, asthma, dysphagia, CHF, heart murmur, seizure.      SLP Plan  Other (Comment) (would benefit from FEES)      Recommendations for follow up therapy are one component of a  multi-disciplinary discharge planning process, led by the attending physician.  Recommendations may be updated based on patient status, additional functional criteria and insurance authorization.    Recommendations  Diet recommendations: NPO Medication Administration: Via alternative means                  Oral care QID   Frequent or constant Supervision/Assistance Dysphagia, unspecified (R13.10)     Other (Comment) (would benefit from FEES)     Royce Macadamia  12/11/2023, 11:36 AM

## 2023-12-12 ENCOUNTER — Inpatient Hospital Stay (HOSPITAL_COMMUNITY): Payer: Medicare PPO

## 2023-12-12 DIAGNOSIS — Z7189 Other specified counseling: Secondary | ICD-10-CM

## 2023-12-12 DIAGNOSIS — N186 End stage renal disease: Secondary | ICD-10-CM | POA: Diagnosis not present

## 2023-12-12 DIAGNOSIS — J189 Pneumonia, unspecified organism: Secondary | ICD-10-CM | POA: Diagnosis not present

## 2023-12-12 DIAGNOSIS — G9340 Encephalopathy, unspecified: Secondary | ICD-10-CM | POA: Diagnosis not present

## 2023-12-12 LAB — CBC
HCT: 25.1 % — ABNORMAL LOW (ref 36.0–46.0)
Hemoglobin: 8.6 g/dL — ABNORMAL LOW (ref 12.0–15.0)
MCH: 30.2 pg (ref 26.0–34.0)
MCHC: 34.3 g/dL (ref 30.0–36.0)
MCV: 88.1 fL (ref 80.0–100.0)
Platelets: 519 10*3/uL — ABNORMAL HIGH (ref 150–400)
RBC: 2.85 MIL/uL — ABNORMAL LOW (ref 3.87–5.11)
RDW: 17.2 % — ABNORMAL HIGH (ref 11.5–15.5)
WBC: 24.8 10*3/uL — ABNORMAL HIGH (ref 4.0–10.5)
nRBC: 0 % (ref 0.0–0.2)

## 2023-12-12 LAB — BASIC METABOLIC PANEL
Anion gap: 17 — ABNORMAL HIGH (ref 5–15)
BUN: 77 mg/dL — ABNORMAL HIGH (ref 8–23)
CO2: 24 mmol/L (ref 22–32)
Calcium: 9.7 mg/dL (ref 8.9–10.3)
Chloride: 93 mmol/L — ABNORMAL LOW (ref 98–111)
Creatinine, Ser: 5.9 mg/dL — ABNORMAL HIGH (ref 0.44–1.00)
GFR, Estimated: 7 mL/min — ABNORMAL LOW (ref >60)
Glucose, Bld: 291 mg/dL — ABNORMAL HIGH (ref 70–99)
Potassium: 5 mmol/L (ref 3.5–5.1)
Sodium: 134 mmol/L — ABNORMAL LOW (ref 135–145)

## 2023-12-12 LAB — GLUCOSE, CAPILLARY
Glucose-Capillary: 169 mg/dL — ABNORMAL HIGH (ref 70–99)
Glucose-Capillary: 194 mg/dL — ABNORMAL HIGH (ref 70–99)
Glucose-Capillary: 198 mg/dL — ABNORMAL HIGH (ref 70–99)
Glucose-Capillary: 220 mg/dL — ABNORMAL HIGH (ref 70–99)
Glucose-Capillary: 247 mg/dL — ABNORMAL HIGH (ref 70–99)
Glucose-Capillary: 292 mg/dL — ABNORMAL HIGH (ref 70–99)
Glucose-Capillary: 294 mg/dL — ABNORMAL HIGH (ref 70–99)

## 2023-12-12 MED ORDER — HEPARIN SODIUM (PORCINE) 1000 UNIT/ML DIALYSIS
1500.0000 [IU] | INTRAMUSCULAR | Status: AC | PRN
  Administered 2023-12-12: 1500 [IU] via INTRAVENOUS_CENTRAL

## 2023-12-12 MED ORDER — INSULIN ASPART 100 UNIT/ML IJ SOLN
2.0000 [IU] | INTRAMUSCULAR | Status: DC
  Administered 2023-12-12 – 2023-12-13 (×6): 2 [IU] via SUBCUTANEOUS

## 2023-12-12 MED ORDER — METOPROLOL TARTRATE 5 MG/5ML IV SOLN
5.0000 mg | Freq: Once | INTRAVENOUS | Status: AC
  Administered 2023-12-12: 5 mg via INTRAVENOUS
  Filled 2023-12-12: qty 5

## 2023-12-12 NOTE — Progress Notes (Addendum)
 NAME:  Carolyn Russo, MRN:  [id number], DOB:  Apr 07, 1951, LOS: 26 ADMISSION DATE:  11/16/2023, CONSULTATION DATE:  1/30 REFERRING MD:  TRH, CHIEF COMPLAINT:  code blue   History of Present Illness:  Carolyn Russo is a 73 yo female She presented to [facility] on 1/23 with confusion, left sided chest pain, shortness of breath, and coughing. She was found to be hypoxic on room air and placed on 2 L [place]. CT chest was done without evidence of PE.  MRI revealed a 1.3cm acute to early subacute right PICA infarct. Also was admitted with pneumonia due to COVID and RSV and was started on antibiotics for likely superimposed bacterial infection.    On 1/30 early morning the patient was ~2 hrs into dialysis when her blood pressure dropped. UF goal was decreased but since the patient's level of consciousnesses decreased, she received 300 cc NS bolus. EKG changes were noted and rapid response was activated, dialysis stopped, and blood returned to the patient. Around 6:30 AM the patient was noted to be apneic without a pulse and code blue was called. Patient was noted to have PEA arrest. CPR was done for a few minutes before ROSC was achieved. She was transferred to 58M and patient was intubated due to failure to protect airway.  Pertinent  Medical History  ESRD/ HD MWF CHF CVA with residual LEFT sided weakness T2DM RIGHT BKA Seizures  Significant Hospital Events: Including procedures, antibiotic start and stop dates in addition to other pertinent events   1/23 admit to [facility] for acute infarct + covid/rsv pneumonia 1/30 admit to ICU after patient had PEA arrest  2/1 Failed SBT due to apnea. ABG with respiratory alkalosis. RR decreased 2/2 Tolerating SBT. Remains encephalopathic 2/5 Continues to tolerate SBT during day, but remains encephalopathic/unable to protect airway if extubated  2/9 extubated, off precedex 2/12 still lethargic, ABG 2/11 unremarkable, limit centrally acting meds 2/15 transferred back to ICU  for atrial fibrillation with rapid ventricular response that developed after a gross aspiration event.  Interim History / Subjective:  On HD now.  Tolerating well.  Monitor shows alternating periods of sinus rhythm and atrial fibrillation, rate controlled.  On amiodarone.  Awake, follows some commands.  Intermittently verbalizes single words.  Cough seems a bit stronger today but still requiring suctioning.  Follows commands.  Objective   Blood pressure (!) 129/50, pulse 81, temperature 98.6 F (37 C), resp. rate 16, height 5\' 3"  (1.6 m), weight 74 kg, SpO2 100%.        Intake/Output Summary (Last 24 hours) at 12/12/2023 0909 Last data filed at 12/12/2023 0800 Gross per 24 hour  Intake 687.5 ml  Output 100 ml  Net 587.5 ml   Filed Weights   12/11/23 0500 12/12/23 0500 12/12/23 0740  Weight: 73.6 kg 74 kg 74 kg    Examination: Gen:     No acute distress.  Non-labored breathing. Lungs:    significantly improved air entry bilaterally with only scant crackles. CV:         regularly irregular, rate controlled. Abd:      + bowel sounds; soft, non-tender, no distension Ext:    No edema, right BKA Skin:      Warm and dry; no rash Neuro:  awake, spontaneously moving  CXR with left lower lobe atelectasis and right basilar opacities  Resolved Hospital Problem list   Hypokalemia Hypotension  Assessment & Plan:  Atrial Fibrillation with RVR, currently in normal sinus rhythm PEA arrest:  due to hypotension, not primarily cardiac in nature  Cardiology input appreciated started on amiodarone (2/15)  Acute Hypoxemic Respiratory Failure Aspiration pneumonia/pneumonitis Atelectasis Pneumonia due to COVID and RSV Productive Cough  Continue with NT suction PRN Stop guaifenesin.  I am not convinced that this has been helpful in making her cough more productive. Chest PT. On tube feeds.  Hyponatremia, mild Likely related to aspiration pneumonia Monitor serum Na  Acute encephalopathy:  secondary to post-arrest, stroke, metabolic derangements, and drug induced. Concern for cefepime toxicity given her hx of ESRD, med was discontinued 2/4. MRI brain did show a new and acute central left cerebellar infarct.  - Appreciate neurology recs.  Stroke not felt to be significant enough to explain altered mental status - Stopped Seroquel and amitriptyline - Limit centrally acting medications as possible - Last ABG without hypercarbia  - 2/12 CXR with pulmonary edema on HD  ESRD on HD MWF - Nephrology help appreciated. - Discontinued Renvela due to low phos levels  Sinusitis  - MRI brain noted progressed mastoid effusions - no fever overnight, WBC trending down - Augmentin BID x 10 days (renal dosed).  Change IV to NG formulation (2/16). - trend fever curve  Acute to early subacute right PICA infarct on 1/24, resolved New acute central left cerebellar infarct  - Appreciate Neurology input  - Continue DAPT with aspirin/Brilinta - Continue statin - Needs 30-day heart monitor at discharge  Hypertension Hypotensive initially, but now normotensive Home hydralazine at reduced dose, monitor  Nephrology help appreciated.   Abnormal C6 signal -MRI showed a nonspecific bone marrow signal of c6 vertebral body.  Unclear if related to a malignancy vs discitis/osteomyelitis.  Will hold off on C spine imaging, as family does not want to pursue this workup and priority is mental status/neuro recovery.  - If no other findings, consider C spine MRI to further evaluate  Seizure disorder - Continue home Keppra 500 mg BID  Anemia of chronic disease - 1 unit blood on 2/10, Hgb stable - trend CBC - Aranesp every Friday   Diabetes - Continue SSI - Tube feeds   Best Practice (right click and "Reselect all SmartList Selections" daily)   Diet/type: tubefeeds  DVT prophylaxis prophylactic heparin  Pressure ulcer(s): pressure ulcer assessment deferred  GI prophylaxis: H2B Lines: R  subclavian HD cath,  Foley:  N/A Code Status:  full code Last date of multidisciplinary goals of care discussion [updated husband, wishes full code. Open to talking with palliative care]   Critical care time: - minutes   Marcelle Smiling, MD Board Certified by the ABIM, Pulmonary Diseases & Critical Care Medicine  PCCM on call pager [phone] until 7pm. Please call Elink 7p-7a. [phone]

## 2023-12-12 NOTE — Procedures (Signed)
 Objective Swallowing Evaluation: Type of Study: FEES-Fiberoptic Endoscopic Evaluation of Swallow   Patient Details  Name: Carolyn Russo MRN: [id number] Date of Birth: 1951/10/14  Today's Date: 12/12/2023 Time: SLP Start Time (ACUTE ONLY): 1207 -SLP Stop Time (ACUTE ONLY): 1229  SLP Time Calculation (min) (ACUTE ONLY): 22 min   Past Medical History:  Past Medical History:  Diagnosis Date   Acute GI bleeding    Allergy    Anemia    Anterior chest wall pain    Appendicitis 1965   Asthma    Body mass index 37.0-37.9, adult    Breast pain    Cataract    both eyes   CHF (congestive heart failure) (HCC)    Cognitive change 04/20/2021   r/t cva 03/2821   Complication of anesthesia    Memory loss after general   Dehydration 2014   Deviated septum 1971   Diabetes mellitus    Dysphagia due to old stroke    easy to get strangled when eating   Dyspnea 2014   ESRD on hemodialysis (HCC)    MWF at [facility]   Extrinsic asthma    WITH ASTHMA ATTACK   Fibroid 1980   GERD (gastroesophageal reflux disease)    Heart murmur    History of migraine    History of seizure    with stroke   Hx gestational diabetes    Hyperlipidemia    Hypertension 2014   Inguinal hernia 1959   Malaise and fatigue 2014   Non-IgE mediated allergic asthma 2014   Obesity    Pelvic pain    Pregnancy, high-risk 1985   Stroke (HCC) 04/20/2021   (CVA) of right basal ganglia   Tonsillitis 1968   Uterine fibroid 1980   Visual field defect    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   Past Surgical History:  Past Surgical History:  Procedure Laterality Date   ABDOMINAL AORTOGRAM W/LOWER EXTREMITY N/A 02/21/2022   Procedure: ABDOMINAL AORTOGRAM W/LOWER EXTREMITY;  Surgeon: Maeola Harman, MD;  Location: [facility] INVASIVE CV LAB;  Service: Cardiovascular;  Laterality: N/A;   AMPUTATION Right 05/18/2022   Procedure: RIGHT BELOW KNEE AMPUTATION;  Surgeon: Nadara Mustard, MD;  Location: [facility] OR;  Service: Orthopedics;  Laterality:  Right;   AMPUTATION TOE Right 04/15/2022   Procedure: AMPUTATION  LST TOERIGHT FOOT;  Surgeon: Edwin Cap, DPM;  Location: [facility] ORS;  Service: Podiatry;  Laterality: Right;   APPENDECTOMY  1959   BASCILIC VEIN TRANSPOSITION Right 04/30/2021   Procedure: RIGHT FIRST STAGE BASCILIC VEIN TRANSPOSITION;  Surgeon: Chuck Hint, MD;  Location: [facility] OR;  Service: Vascular;  Laterality: Right;   CESAREAN SECTION  1985   COLONOSCOPY     ESOPHAGOGASTRODUODENOSCOPY (EGD) WITH PROPOFOL N/A 04/18/2021   Procedure: ESOPHAGOGASTRODUODENOSCOPY (EGD) WITH PROPOFOL;  Surgeon: Iva Boop, MD;  Location: [facility] ENDOSCOPY;  Service: Endoscopy;  Laterality: N/A;   EYE SURGERY     bilateral cataract    FLEXIBLE SIGMOIDOSCOPY N/A 04/18/2021   Procedure: FLEXIBLE SIGMOIDOSCOPY;  Surgeon: Iva Boop, MD;  Location: [facility] ENDOSCOPY;  Service: Endoscopy;  Laterality: N/A;   HERNIA REPAIR  1959   IR FLUORO GUIDE CV LINE RIGHT  03/18/2021   IR US GUIDE VASC ACCESS RIGHT  03/18/2021   LEFT HEART CATH AND CORONARY ANGIOGRAPHY N/A 04/04/2017   Procedure: Left Heart Cath and Coronary Angiography;  Surgeon: Lyn Records, MD;  Location: [facility] INVASIVE CV LAB;  Service: Cardiovascular;  Laterality: N/A;   MYOMECTOMY  1980, 2004, 2007   PERIPHERAL VASCULAR THROMBECTOMY  02/21/2022   Procedure: PERIPHERAL VASCULAR THROMBECTOMY;  Surgeon: Maeola Harman, MD;  Location: [facility] INVASIVE CV LAB;  Service: Cardiovascular;;  Right Tibial   RHINOPLASTY  1971   ROTATOR CUFF REPAIR  2003   SURGICAL REPAIR OF HEMORRHAGE  2015   TONSILLECTOMY  1968   HPI: Pt is a 73 y.o. female with who presented from SNF on 11/16/23 for chest pain, weakness and confusion. CT head revealed infarct in inferior right cerebellum which was new since 06/12/22. MRI brain with acute right inferomedial cerebellar ischemic infarction in the right PICA territory. Pt also diagnosed with COVID/RSV. On 2/15  transferred back to ICU for atrial fibrillation  with rapid ventricular response that developed after a gross aspiration event from vomiting. MBS 1/28 DIGEST swallow severity rating of 1, mild. Recommended dys2/thin liquids. Secondary swallows needed to clear pharyngeal residue. Experienced cardiac arrest on 1/30 and was intubated until 2/9 (ten days) and remained NPO thereafter with cortrak. PMH: CVA in 2022 and 2023, right BKA, HTN, DM-2, HLD, ESRD on HD MWF, asthma, dysphagia, CHF, heart murmur, seizure.     Recommendations for follow up therapy are one component of a multi-disciplinary discharge planning process, led by the attending physician.  Recommendations may be updated based on patient status, additional functional criteria and insurance authorization.  Assessment / Plan / Recommendation     12/12/2023    1:54 PM  Clinical Impressions  Clinical Impression Pt's swallowing ability has decreased since [facility] 1/28 following a 10 day intubation. She exhibited difficulty consistently following commands with delayed processing. Observance of anatomy revealed excrescence on left vocal cord and bilateral vocal cord edema. There was significantly decreased bilateral arytenoid movement and pt was aphonic upon attempts at vocalization. There were mild standing secretions in valleculae and pyriform sinsuses. Lingual pumping present with propulsion of puree texture. Puree was penetrated before full glottal closure achieved and penetrate was silently aspirated (gross) (PAS 8) with some penetrate fluttering in and out of glottis during respiration . She was unable to produce a throat clear or cough with multiple requests. Teaspoon thin liquid was also silently aspirated suspected during the swallow as epigottis obstructs view. Given cognitive impairments compensatory strategies were not attempted. There was minimal residue in valleculae and pyriform sinuses. Pt received a DIGEST score of 3 (severe). Recommend pt continue NPO status and ST will work with pt to  improve cough/throat clear, phonation and airway protection.  DIGEST Swallow Severity Rating*  Safety:   Efficiency:  Overall Pharyngeal Swallow Severity:  1: mild; 2: moderate; 3: severe; 4: profound  *The Dynamic Imaging Grade of Swallowing Toxicity is standardized for the head and neck cancer population, however, demonstrates promising clinical applications across populations to standardize the clinical rating of pharyngeal swallow safety and severity.   SLP Visit Diagnosis Dysphagia, oropharyngeal phase (R13.12)  Attention and concentration deficit following --  Frontal lobe and executive function deficit following --  Impact on safety and function Severe aspiration risk         12/12/2023    1:54 PM  Treatment Recommendations  Treatment Recommendations Therapy as outlined in treatment plan below        12/12/2023    1:54 PM  Prognosis  Prognosis for improved oropharyngeal function Fair  Barriers to Reach Goals Cognitive deficits;Severity of deficits  Barriers/Prognosis Comment --       12/12/2023    1:54 PM  Diet Recommendations  SLP Diet Recommendations NPO  Liquid Administration via --  Medication Administration Via alternative means  Compensations --  Postural Changes --         12/12/2023    1:54 PM  Other Recommendations  Recommended Consults --  Oral Care Recommendations Oral care QID  Caregiver Recommendations --  Follow Up Recommendations Skilled nursing-short term rehab (<3 hours/day)  Assistance recommended at discharge --  Functional Status Assessment Patient has had a recent decline in their functional status and demonstrates the ability to make significant improvements in function in a reasonable and predictable amount of time.       12/12/2023    1:54 PM  Frequency and Duration   Speech Therapy Frequency (ACUTE ONLY) min 2x/week  Treatment Duration 2 weeks         12/12/2023    1:54 PM  Oral Phase  Oral Phase Impaired  Oral - Pudding  Teaspoon --  Oral - Pudding Cup --  Oral - Honey Teaspoon --  Oral - Honey Cup --  Oral - Nectar Teaspoon --  Oral - Nectar Cup --  Oral - Nectar Straw --  Oral - Thin Teaspoon Delayed oral transit  Oral - Thin Cup Lingual pumping;Delayed oral transit  Oral - Thin Straw NT  Oral - Puree Delayed oral transit;Lingual pumping  Oral - Mech Soft --  Oral - Regular --  Oral - Multi-Consistency --  Oral - Pill --  Oral Phase - Comment --       12/12/2023    1:54 PM  Pharyngeal Phase  Pharyngeal Phase Impaired  Pharyngeal- Pudding Teaspoon --  Pharyngeal --  Pharyngeal- Pudding Cup --  Pharyngeal --  Pharyngeal- Honey Teaspoon --  Pharyngeal --  Pharyngeal- Honey Cup --  Pharyngeal --  Pharyngeal- Nectar Teaspoon --  Pharyngeal --  Pharyngeal- Nectar Cup --  Pharyngeal --  Pharyngeal- Nectar Straw --  Pharyngeal --  Pharyngeal- Thin Teaspoon Reduced airway/laryngeal closure;Moderate aspiration;Pharyngeal residue - pyriform;Penetration/Aspiration during swallow  Pharyngeal Material enters airway, passes BELOW cords without attempt by patient to eject out (silent aspiration)  Pharyngeal- Thin Cup --  Pharyngeal --  Pharyngeal- Thin Straw --  Pharyngeal --  Pharyngeal- Puree Pharyngeal residue - valleculae;Pharyngeal residue - pyriform;Reduced airway/laryngeal closure;Delayed swallow initiation-vallecula;Penetration/Aspiration before swallow;Significant aspiration (Amount);Penetration/Aspiration during swallow  Pharyngeal Material enters airway, passes BELOW cords without attempt by patient to eject out (silent aspiration)  Pharyngeal- Mechanical Soft --  Pharyngeal --  Pharyngeal- Regular --  Pharyngeal --  Pharyngeal- Multi-consistency --  Pharyngeal --  Pharyngeal- Pill --  Pharyngeal --  Pharyngeal Comment --        12/12/2023    1:54 PM  Cervical Esophageal Phase   Cervical Esophageal Phase WFL  Pudding Teaspoon --  Pudding Cup --  Honey Teaspoon --  Honey Cup  --  Nectar Teaspoon --  Nectar Cup --  Nectar Straw --  Thin Teaspoon --  Thin Cup --  Thin Straw --  Puree --  Mechanical Soft --  Regular --  Multi-consistency --  Pill --  Cervical Esophageal Comment --     Royce Macadamia 12/12/2023, 3:03 PM

## 2023-12-12 NOTE — Progress Notes (Signed)
 Daily Progress Note   Patient Name: Carolyn Russo       Date: 12/12/2023 DOB: 07-08-1951  Age: 73 y.o. MRN#: [id number] Attending Physician: Marcelle Smiling, MD Primary Care Physician: Annita Brod, MD Admit Date: 11/16/2023  Reason for Consultation/Follow-up: Establishing goals of care  Patient Profile/HPI:  "[name] with ESRD on HD, CHF, prior CVA, DMII, right BKA and seizure disorder who is admitted with acute hypoxemic respiratory failure due to Covid and RSV pneumonia, c/b acute right PICA infarct and PEA arrest. Extubated 2/9 and stable on , has lots of secretions. Concern for high risk of deterioration. Family states would want full scope of care, full code. Husband is a physician and has support from their son. They want to engage with palliative team for support. "   Subjective:  Chart reviewed including labs, progress notes, imaging from this and previous encounters.  Patient awake, did not answer me verbally. Per review she has been following some commands.  Called her spouse. He did not have any questions or concerns. He is awaiting result of swallow evaluation.   Review of Systems  Unable to perform ROS: Mental status change     Physical Exam Vitals and nursing note reviewed.  Constitutional:      Appearance: She is ill-appearing.  Cardiovascular:     Rate and Rhythm: Normal rate.  Pulmonary:     Effort: Pulmonary effort is normal.  Skin:    General: Skin is warm and dry.     Coloration: Skin is pale.  Neurological:     Comments: Did not answer my assessment questions, did not follow commands             Vital Signs: BP (!) 130/104   Pulse (!) 120   Temp 98.4 F (36.9 C) (Axillary)   Resp (!) 24   Ht 5\' 3"  (1.6 m)   Wt 74 kg   SpO2 99%   BMI 28.90 kg/m   SpO2: SpO2: 99 % O2 Device: O2 Device: Nasal Cannula O2 Flow Rate: O2 Flow Rate (L/min): 2 L/min  Intake/output summary:  Intake/Output Summary (Last 24 hours) at 12/12/2023 1254 Last data filed at 12/12/2023 1100 Gross per 24 hour  Intake 642.5 ml  Output 1800 ml  Net -1157.5 ml   LBM: Last BM Date : 12/12/23 Baseline Weight:  Weight: 79.5 kg Most recent weight: Weight: 74 kg       Palliative Assessment/Data: PPS: 20%- tube feeding in place      Patient Active Problem List   Diagnosis Date Noted   Atrial fibrillation with rapid ventricular response (HCC) 12/09/2023   Pressure injury of skin 12/09/2023   Cardiac arrest (HCC) 11/28/2023   Troponin I above reference range 11/16/2023   CAP (community acquired pneumonia) 11/16/2023   Pneumonia 11/16/2023   Elevated troponin 10/24/2023   Hypokalemia 10/24/2023   Memory loss 10/24/2023   History of seizure disorder 10/24/2023   GERD (gastroesophageal reflux disease) 10/24/2023   Primary insomnia 09/11/2023   Wheelchair dependence 06/09/2023   Hx of right BKA (HCC) 03/10/2023   CHF (congestive heart failure) (HCC) 02/10/2023   Dyspnea 02/09/2023   Acute respiratory distress 02/09/2023   Urinary tract infection without hematuria    Diabetes mellitus due to underlying condition with hypoglycemia without coma, with long-term current use of insulin (HCC)    Seizure disorder (HCC) 06/22/2022   Acute respiratory failure (HCC)    Unresponsiveness    Pressure ulcer 06/07/2022   Acute stroke due to ischemia (HCC) 06/03/2022   Chest pain 06/01/2022   History of CVA with residual deficit 06/01/2022   Seizure (HCC)    Acute encephalopathy    Below-knee amputation of right lower extremity (HCC)    Leukocytosis    Foot infection 05/10/2022   PAD (peripheral artery disease) (HCC) 05/10/2022   Gangrene of right foot (HCC) 04/15/2022   Left hemiparesis (HCC) 09/24/2021   Abnormality of gait 09/24/2021   Pain of right hand  08/06/2021   Steal syndrome of dialysis vascular access (HCC) 08/06/2021   Subclavian steal syndrome of right subclavian artery 06/25/2021   Right hand weakness 06/25/2021   Stroke-like symptoms 05/06/2021   Bandemia    Cerebrovascular accident (CVA) of right basal ganglia (HCC) 04/20/2021   Hemorrhoids    Basal ganglia infarction (HCC) 04/16/2021   ESRD on dialysis (HCC) 04/16/2021   Hypertensive urgency 03/10/2021   Hilar enlargement    Anemia of chronic disease    Chronic diastolic CHF (congestive heart failure) (HCC) 03/09/2021   CKD (chronic kidney disease), stage V (HCC) 03/08/2021   Diabetic retinopathy (HCC) 03/08/2021   Hyperglycemia due to type 2 diabetes mellitus (HCC) 03/08/2021   Pure hypercholesterolemia 03/08/2021   Anemia in chronic kidney disease 09/24/2020   Uncontrolled type 2 diabetes mellitus with hyperglycemia (HCC) 03/19/2019   Osteopenia 09/04/2018   Migraine 09/04/2018   Asthma 09/04/2018   Pseudophakia of both eyes 07/17/2018   Pseudophakia of left eye 07/03/2018   Open angle with borderline findings and high glaucoma risk in left eye 07/03/2018   Age-related nuclear cataract of right eye 07/03/2018   CAD (coronary artery disease), native coronary artery 04/27/2017   Abnormal nuclear stress test 04/04/2017   Shortness of breath 03/09/2017   Appendicitis    Age-related hypermature cataract of both eyes 12/20/2016   Uterine leiomyoma 11/27/2012   Dyslipidemia (high LDL; low HDL) 11/25/2011   Obesity (BMI 30-39.9) 11/25/2011   Hypertensive disorder 11/21/2011   Type 2 diabetes mellitus (HCC) 11/21/2011   Abnormal EKG 11/21/2011    Palliative Care Assessment & Plan    Assessment/Recommendations/Plan  Continue current plan of care, PMT will continue to follow   Code Status:   Code Status: Full Code   Prognosis:  Unable to determine  Discharge Planning: To Be Determined  Care plan was discussed with patient's spouse.  Thank you for  allowing the Palliative Medicine Team to assist in the care of this patient.  Total time:   Prolonged billing:  Time includes:   Preparing to see the patient (e.g., review of tests) Obtaining and/or reviewing separately obtained history Performing a medically necessary appropriate examination and/or evaluation Counseling and educating the patient/family/caregiver Ordering medications, tests, or procedures Referring and communicating with other health care professionals (when not reported separately) Documenting clinical information in the electronic or other health record Independently interpreting results (not reported separately) and communicating results to the patient/family/caregiver Care coordination (not reported separately) Clinical documentation  Ocie Bob, AGNP-C Palliative Medicine   Please contact Palliative Medicine Team phone at [phone] for questions and concerns.

## 2023-12-12 NOTE — Progress Notes (Signed)
 [facility] Progress Note   Subjective:    Pt awake, cortrak in place HD today, tried for 2-2.5 L but abrupt tachycardia mid- HD and soft bp's affected UF goal Total UF was 1.7 L  Objective Vitals:   12/12/23 1445 12/12/23 1500 12/12/23 1515 12/12/23 1528  BP: 112/60 (!) 101/59 113/76   Pulse: (!) 125 (!) 123 (!) 121   Resp: 15 (!) 25 (!) 26   Temp:    98.5 F (36.9 C)  TempSrc:    Axillary  SpO2: 96% 99% 100%   Weight:      Height:       Physical Exam General: Chronically ill-appearing Heart: S1 and S2; No murmurs, gallops, or rubs Lungs: rhonchi bilat are less dramatic today Abdomen: Soft and non-tender Extremities:No LE edema Dialysis Access: R femoral TDC   Dialysis Orders: MWF [facility]  4h   B350    75.5kg   2/2 bath   R fem TDC  Heparin 2600 + 1400 midrun - last HD 1/22, getting to dry wt - venofer 100 qhd thru 1/31 - mircera 150 q2, last 1/17, due 1/31  Renal-related home meds: - rocaltrol 0.5 mwf - sensipar 30 every day - hydralazine 100 tid - losartan 50 bid - renvela 800mg  ac tid - others: brilinta, crestor, keppra, ritalin, neurontin, asa, PPI, dulera, insulin, albuterol, elavil  CXR 2/15 - no change, small R effusion, no edema/ congestion / infiltrates   Assessment/Plan: AHRF/ +COVID +RSV pna/ volume^: SP course of abx + HD. Extubated 2/09 . Per CCM.  CVA: R then L cerebellar infarcts d/t small vessel disease. Per neurology.  On  DAPT AMS: multifactorial. Has improved over last week.  Aspiration pneumonitis - CXR clear 2/15. Triggered Afib RVR. Moved back to ICU. ESRD: on HD MWF. HD today off schedule, will keep TTS schedule this week.  BP: bp's labile, 95/70- 130/ 50 Volume: BP's labile 95- 135/ 48- 75. CXR completely clear on 2/15. Only able to UF 1.7 L today.  Anemia of esrd: Hb is down to 8-9 range. Weekly sq darbe qFri. Tsat 26% and ferritin 1638. Pharm rec'd po ferrous sulfate daily.  Transfuse as needed Secondary  hyperparathyroidism - phos in range. Calcium up when corrected, not on vdra.  Resume binders when taking po H/o seizure d/o - on Keppra PEA arrest  Vinson Moselle  MD  [facility] 12/12/2023, 4:11 PM  Recent Labs  Lab 12/08/23 0352 12/09/23 0538 12/11/23 1620 12/12/23 0531  HGB 9.6* 9.2*  --  8.6*  ALBUMIN 2.1* 2.1*  --   --   CALCIUM 9.1 8.9 9.7 9.7  PHOS 2.3* 2.0*  --   --   CREATININE 3.75* 2.76* 5.39* 5.90*  K 4.5 4.4 5.2* 5.0    Inpatient medications:  amiodarone  200 mg Per Tube BID   amoxicillin-clavulanate  1 tablet Per Tube Q12H   arformoterol  15 mcg Nebulization BID   aspirin  81 mg Per Tube Daily   budesonide (PULMICORT) nebulizer solution  0.5 mg Nebulization BID   Chlorhexidine Gluconate Cloth  6 each Topical Q0600   darbepoetin (ARANESP) injection - DIALYSIS  150 mcg Subcutaneous Q Mon-1800   ferrous sulfate  300 mg Per Tube Daily   heparin injection (subcutaneous)  5,000 Units Subcutaneous Q8H   hydrALAZINE  50 mg Per Tube Q8H   insulin aspart  0-15 Units Subcutaneous Q4H   insulin aspart  2 Units Subcutaneous Q4H   insulin glargine-yfgn  5 Units Subcutaneous  Daily   levETIRAcetam  500 mg Per Tube BID   multivitamin  1 tablet Per Tube QHS   nutrition supplement (JUVEN)  1 packet Per Tube BID BM   mouth rinse  15 mL Mouth Rinse Q4H   rosuvastatin  10 mg Per Tube Daily   sodium chloride flush  3 mL Intravenous Q12H   ticagrelor  90 mg Per Tube BID    anticoagulant sodium citrate     feeding supplement (VITAL 1.5 CAL) 45 mL/hr at 12/12/23 1500   acetaminophen **OR** acetaminophen, alteplase, anticoagulant sodium citrate, docusate, feeding supplement (NEPRO CARB STEADY), heparin, ipratropium-albuterol, labetalol, lidocaine (PF), lidocaine-prilocaine, ondansetron (ZOFRAN) IV, mouth rinse, pentafluoroprop-tetrafluoroeth, polyethylene glycol, Racepinephrine HCl, white petrolatum, witch hazel-glycerin

## 2023-12-12 NOTE — Progress Notes (Signed)
 Occupational Therapy Treatment Patient Details Name: Carolyn Russo MRN: [id number] DOB: 02/15/1951 Today's Date: 12/12/2023   History of present illness Pt is a 73 yo female presenting to [facility] on 11/16/23 for chest pain, weakness, and confusion. CT head revealed infarct in the inferior right cerebellum. MRI brain with acute right inferomedial cerebellar ischemic infarction in the right PICA territory. Pt also diagnosed with Covid/RSV. PEA arrest 1/30. Extubated 2/9. PMH: R BKA on 7/26, DM2, HTN, HLD, ESRD on HD MWF, hx of CVA 03/2021 & 05/2022, asthma, CHF, dysphagia, heart murmur, seizure   OT comments  Pt not progressing toward goals this session, remains lethargic with eyes closed, briefly opens eyes to name/tactile stimuli. Pt following ~25% of commands with RUE from AAROM, able to demonstrate a "thumbs up" with R hand. Pt with no active movement with LUE during session, noted to be edematous, performed PROM. Pt presenting with impairments listed below, will follow acutely. Patient will benefit from continued inpatient follow up therapy, <3 hours/day to maximize safety/ind with ADL/functional mobility.       If plan is discharge home, recommend the following:  A lot of help with bathing/dressing/bathroom;Two people to help with walking and/or transfers;Assistance with cooking/housework;Direct supervision/assist for medications management;Assistance with feeding;Direct supervision/assist for financial management;Assist for transportation;Help with stairs or ramp for entrance   Equipment Recommendations  Hoyer lift;Hospital bed    Recommendations for Other Services      Precautions / Restrictions Precautions Precautions: Fall Recall of Precautions/Restrictions: Impaired Precaution/Restrictions Comments: Monitor O2. Rt CVC Required Braces or Orthoses: Other Brace Other Brace: RLE prosthetic Restrictions Weight Bearing Restrictions Per Provider Order: No RLE Weight Bearing Per Provider  Order: Non weight bearing Other Position/Activity Restrictions: R BKA- prosthetic in room       Mobility Bed Mobility Overal bed mobility: Needs Assistance             General bed mobility comments: total A +2 to scoot up in bed    Transfers                   General transfer comment: defer     Balance                                           ADL either performed or assessed with clinical judgement   ADL Overall ADL's : Needs assistance/impaired     Grooming: Total assistance;Bed level                                      Extremity/Trunk Assessment Upper Extremity Assessment Upper Extremity Assessment: Right hand dominant RUE Deficits / Details: AAROM to RUE, shoulder ROM WFL, can hold RUE up against gravity. grasp 2+/5 LUE Deficits / Details: no active movement noted. PROM WFL, edematous LUE Sensation: decreased light touch LUE Coordination: decreased fine motor;decreased gross motor   Lower Extremity Assessment Lower Extremity Assessment: Defer to PT evaluation        Vision   Additional Comments: needs further assessment, pt keeps eyes closed throughout session   Perception Perception Perception: Not tested   Praxis Praxis Praxis: Not tested   Communication Communication Communication: Impaired Factors Affecting Communication: Difficulty expressing self;Reduced clarity of speech   Cognition Arousal: Lethargic Behavior During Therapy: Flat affect Cognition: Difficult to assess Difficult to  assess due to: Impaired communication, Level of arousal           OT - Cognition Comments: states "ouch" but is unable to describe or point to where pain is, follows ~25% of commands during session but keeps eyes closed                 Following commands: Impaired        Cueing   Cueing Techniques: Verbal cues, Gestural cues, Tactile cues, Visual cues  Exercises      Shoulder Instructions        General Comments VSS    Pertinent Vitals/ Pain       Pain Assessment Pain Assessment: Faces Pain Score: 4  Faces Pain Scale: Hurts little more Pain Location: grimaces to pain when pressing on L and R thumb webspaces Pain Descriptors / Indicators: Aching, Discomfort Pain Intervention(s): Limited activity within patient's tolerance, Monitored during session, Repositioned  Home Living                                          Prior Functioning/Environment              Frequency  Min 1X/week        Progress Toward Goals  OT Goals(current goals can now be found in the care plan section)  Progress towards OT goals: Not progressing toward goals - comment (limited engagement/participation during session)  Acute Rehab OT Goals Patient Stated Goal: pt unable to state OT Goal Formulation: Patient unable to participate in goal setting Time For Goal Achievement: 12/21/23 Potential to Achieve Goals: Fair ADL Goals Pt/caregiver will Perform Home Exercise Program: Increased strength;Increased ROM;Both right and left upper extremity;With written HEP provided Additional ADL Goal #1: Pt to follow one step commands > 50% of the time Additional ADL Goal #2: Pt to maintain sitting balance EOB > 2 min with no more than Mod A during functional tasks  Plan      Co-evaluation                 AM-PAC OT "6 Clicks" Daily Activity     Outcome Measure   Help from another person eating meals?: Total Help from another person taking care of personal grooming?: Total Help from another person toileting, which includes using toliet, bedpan, or urinal?: Total Help from another person bathing (including washing, rinsing, drying)?: Total Help from another person to put on and taking off regular upper body clothing?: Total Help from another person to put on and taking off regular lower body clothing?: Total 6 Click Score: 6    End of Session Equipment Utilized During  Treatment: Oxygen  OT Visit Diagnosis: Muscle weakness (generalized) (M62.81);Other symptoms and signs involving cognitive function;Pain;Other abnormalities of gait and mobility (R26.89);Unsteadiness on feet (R26.81);Other (comment) Hemiplegia - Right/Left: Left Hemiplegia - dominant/non-dominant: Non-Dominant Hemiplegia - caused by: Cerebral infarction   Activity Tolerance Patient limited by fatigue   Patient Left in bed;with call bell/phone within reach;with nursing/sitter in room   Nurse Communication Mobility status        Time: 1610-9604 OT Time Calculation (min): 16 min  Charges: OT General Charges $OT Visit: 1 Visit OT Treatments $Therapeutic Activity: 8-22 mins  Carver Fila, OTD, OTR/L SecureChat Preferred Acute Rehab [phone]   Carver Fila Koonce 12/12/2023, 4:07 PM

## 2023-12-12 NOTE — Progress Notes (Signed)
 Received patient in bed to unit.  Alert and oriented.  Informed consent signed and in chart.   TX duration:  Patient tolerated first part well and became tachycardic - HR 130-150s - tx terminated after UF stopped and BFR reduced without effect.  Alert, without acute distress.  Hand-off given to patient's nurse.   Access used: RTDC Access issues: dressing changed  Total UF removed: 1.7 Medication(s) given: pre, mid tx heparin bolus, post HD tx TDC heparin block    12/12/23 1100  Vitals  BP (!) 94/56  MAP (mmHg) 67  Pulse Rate (!) 132  ECG Heart Rate (!) 142  Resp 17  Oxygen Therapy  SpO2 100 %  During Treatment Monitoring  Blood Flow Rate (mL/min) 0 mL/min  Arterial Pressure (mmHg) 0 mmHg  Venous Pressure (mmHg) 0 mmHg  TMP (mmHg) 7.07 mmHg  Ultrafiltration Rate (mL/min) 0 mL/min  Dialysate Flow Rate (mL/min) 300 ml/min  Dialysate Potassium Concentration 2  Dialysate Calcium Concentration 2.5  Duration of HD Treatment -hour(s) 2.79 hour(s)  Cumulative Fluid Removed (mL) per Treatment  1693  HD Safety Checks Performed Yes  Intra-Hemodialysis Comments Tx completed  Dialysis Fluid Bolus Normal Saline  Bolus Amount (mL) 300 mL  Post Treatment  Dialyzer Clearance Clear  Hemodialysis Intake (mL) 0 mL  Liters Processed 58.5  Fluid Removed (mL) 1700 mL  Tolerated HD Treatment No (Comment) (see post tx note)  Hemodialysis Catheter Right Subclavian  No placement date or time found.   Orientation: Right  Access Location: Subclavian  Site Condition No complications  Blue Lumen Status Flushed  Red Lumen Status Flushed  Catheter fill solution Heparin 1000 units/ml  Catheter fill volume (Arterial) 2.1 cc  Catheter fill volume (Venous) 2.1  Dressing Type Transparent  Dressing Status Antimicrobial disc/dressing in place  Post treatment catheter status Capped and Clamped      Freddi Starr, RN [facility]

## 2023-12-13 DIAGNOSIS — R059 Cough, unspecified: Secondary | ICD-10-CM | POA: Diagnosis not present

## 2023-12-13 DIAGNOSIS — I469 Cardiac arrest, cause unspecified: Secondary | ICD-10-CM | POA: Diagnosis not present

## 2023-12-13 LAB — GLUCOSE, CAPILLARY
Glucose-Capillary: 140 mg/dL — ABNORMAL HIGH (ref 70–99)
Glucose-Capillary: 166 mg/dL — ABNORMAL HIGH (ref 70–99)
Glucose-Capillary: 188 mg/dL — ABNORMAL HIGH (ref 70–99)
Glucose-Capillary: 202 mg/dL — ABNORMAL HIGH (ref 70–99)
Glucose-Capillary: 219 mg/dL — ABNORMAL HIGH (ref 70–99)
Glucose-Capillary: 244 mg/dL — ABNORMAL HIGH (ref 70–99)

## 2023-12-13 LAB — BASIC METABOLIC PANEL
Anion gap: 13 (ref 5–15)
BUN: 59 mg/dL — ABNORMAL HIGH (ref 8–23)
CO2: 26 mmol/L (ref 22–32)
Calcium: 8.8 mg/dL — ABNORMAL LOW (ref 8.9–10.3)
Chloride: 95 mmol/L — ABNORMAL LOW (ref 98–111)
Creatinine, Ser: 4.12 mg/dL — ABNORMAL HIGH (ref 0.44–1.00)
GFR, Estimated: 11 mL/min — ABNORMAL LOW (ref >60)
Glucose, Bld: 213 mg/dL — ABNORMAL HIGH (ref 70–99)
Potassium: 4.7 mmol/L (ref 3.5–5.1)
Sodium: 134 mmol/L — ABNORMAL LOW (ref 135–145)

## 2023-12-13 LAB — CBC
HCT: 23.8 % — ABNORMAL LOW (ref 36.0–46.0)
Hemoglobin: 8.1 g/dL — ABNORMAL LOW (ref 12.0–15.0)
MCH: 30.6 pg (ref 26.0–34.0)
MCHC: 34 g/dL (ref 30.0–36.0)
MCV: 89.8 fL (ref 80.0–100.0)
Platelets: 498 10*3/uL — ABNORMAL HIGH (ref 150–400)
RBC: 2.65 MIL/uL — ABNORMAL LOW (ref 3.87–5.11)
RDW: 17.2 % — ABNORMAL HIGH (ref 11.5–15.5)
WBC: 21.1 10*3/uL — ABNORMAL HIGH (ref 4.0–10.5)
nRBC: 0.1 % (ref 0.0–0.2)

## 2023-12-13 LAB — MAGNESIUM: Magnesium: 2.2 mg/dL (ref 1.7–2.4)

## 2023-12-13 MED ORDER — INSULIN ASPART 100 UNIT/ML IJ SOLN
4.0000 [IU] | INTRAMUSCULAR | Status: DC
  Administered 2023-12-13 – 2023-12-22 (×42): 4 [IU] via SUBCUTANEOUS

## 2023-12-13 MED ORDER — CHLORHEXIDINE GLUCONATE CLOTH 2 % EX PADS
6.0000 | MEDICATED_PAD | Freq: Every day | CUTANEOUS | Status: DC
  Administered 2023-12-15 – 2023-12-16 (×2): 6 via TOPICAL

## 2023-12-13 NOTE — TOC Progression Note (Addendum)
 Transition of Care [facility]) - Progression Note    Patient Details  Name: Carolyn Russo MRN: [id number] Date of Birth: 1951/04/28  Transition of Care [facility]) CM/SW Contact  Marliss Coots, LCSW Phone Number: 12/13/2023, 1:04 PM  Clinical Narrative:     1:04 PM Per medical team, speech therapy continues to follow patient in efforts to wean off tube feeds or transition to PEG. Palliative care is also following patient. Return to [facility] remains the expected discharge plan.  Expected Discharge Plan: Skilled Nursing Facility    Expected Discharge Plan and Services In-house Referral: Clinical Social Work     Living arrangements for the past 2 months: Single Family Home, Skilled Nursing Facility                                       Social Determinants of Health (SDOH) Interventions SDOH Screenings   Food Insecurity: No Food Insecurity (11/17/2023)  Housing: Low Risk  (11/17/2023)  Transportation Needs: No Transportation Needs (11/17/2023)  Utilities: Not At Risk (11/17/2023)  Alcohol Screen: Low Risk  (01/09/2019)  Depression (PHQ2-9): Low Risk  (09/11/2023)  Financial Resource Strain: Low Risk  (09/02/2021)  Physical Activity: Inactive (09/02/2021)  Social Connections: Socially Integrated (11/17/2023)  Stress: Stress Concern Present (09/02/2021)  Tobacco Use: Low Risk  (11/16/2023)    Readmission Risk Interventions    02/14/2023    4:54 PM  Readmission Risk Prevention Plan  Transportation Screening Complete  PCP or Specialist Appt within 3-5 Days Complete  HRI or Home Care Consult Complete  Social Work Consult for Recovery Care Planning/Counseling Complete  Palliative Care Screening Complete  Medication Review Oceanographer) Complete

## 2023-12-13 NOTE — Progress Notes (Signed)
 SLP Cancellation Note  Patient Details Name: Carolyn Russo MRN: [id number] DOB: 1951-04-30   Cancelled treatment:       Reason Eval/Treat Not Completed: Fatigue/lethargy limiting ability to participate. Pt given sternal rub, cold cloth, trap sqeeze- pt started to cough and opened eyes for 10 seconds then fell back to sleep and cough not arouse pt. Will continue efforts.   Royce Macadamia 12/13/2023, 11:49 AM

## 2023-12-13 NOTE — Progress Notes (Signed)
 Physical Therapy Treatment Patient Details Name: Carolyn Russo MRN: [id number] DOB: 08-31-1951 Today's Date: 12/13/2023   History of Present Illness Pt is a 73 yo female presenting to [facility] on 11/16/23 for chest pain, weakness, and confusion. CT head revealed infarct in the inferior right cerebellum. MRI brain with acute right inferomedial cerebellar ischemic infarction in the right PICA territory. Pt also diagnosed with Covid/RSV. PEA arrest 1/30. Extubated 2/9. PMH: R BKA on 7/26, DM2, HTN, HLD, ESRD on HD MWF, hx of CVA 03/2021 & 05/2022, asthma, CHF, dysphagia, heart murmur, seizure    PT Comments  Pt lethargic with intermittent periods of alertness during session. When alert followed 1 step commands with rt side ~70% of time. No active movement to command or spontaneously noted on lt side. Impairments are severe. Will work toward pt getting OOB via lift to recliner since eventually pt will need to tolerate HD in a recliner. Patient will benefit from continued inpatient follow up therapy, <3 hours/day.      If plan is discharge home, recommend the following: Two people to help with walking and/or transfers;Two people to help with bathing/dressing/bathroom;Assistance with cooking/housework;Direct supervision/assist for medications management;Help with stairs or ramp for entrance   Can travel by private vehicle     No  Equipment Recommendations  Other (comment) (To be determined in next venue)    Recommendations for Other Services       Precautions / Restrictions Precautions Precautions: Fall Recall of Precautions/Restrictions: Impaired Required Braces or Orthoses: Other Brace Other Brace: RLE prosthetic Restrictions Weight Bearing Restrictions Per Provider Order: No Other Position/Activity Restrictions: R BKA- prosthetic in room     Mobility  Bed Mobility Overal bed mobility: Needs Assistance             General bed mobility comments: Placed bed in chair position and position  pt midline with total assist. Attempted to facilitate pt bring trunk forward from bed in chair position without success. Total assist to bring trunk forward.    Transfers                   General transfer comment: Will need mechanical lift    Ambulation/Gait                   Stairs             Wheelchair Mobility     Tilt Bed    Modified Rankin (Stroke Patients Only)       Balance Overall balance assessment: Needs assistance Sitting-balance support: Bilateral upper extremity supported, Feet supported Sitting balance-Leahy Scale: Zero Sitting balance - Comments: No active trunk engagement from chair position                                    Communication Communication Communication: Impaired Factors Affecting Communication: Difficulty expressing self;Reduced clarity of speech  Cognition Arousal: Lethargic Behavior During Therapy: Flat affect     Difficult to assess due to: Level of arousal, Impaired communication                     PT - Cognition Comments: Periods of alertness during session mixed with periods of lethargy Following commands: Impaired Following commands impaired: Follows one step commands inconsistently, Follows one step commands with increased time    Cueing Cueing Techniques: Verbal cues, Gestural cues, Tactile cues, Visual cues  Exercises General Exercises - Upper  Extremity Shoulder Flexion: AAROM, Right, 10 reps, PROM, Left General Exercises - Lower Extremity Heel Slides: PROM, Left, 5 reps, Supine Hip ABduction/ADduction: AAROM, Right, 10 reps, PROM, Left, Supine Hip Flexion/Marching: AAROM, Right, 10 reps, Supine    General Comments General comments (skin integrity, edema, etc.): VSS      Pertinent Vitals/Pain Pain Assessment Faces Pain Scale: No hurt Facial Expression: Relaxed, neutral Body Movements: Absence of movements Muscle Tension: Relaxed Compliance with ventilator (intubated  pts.): N/A Vocalization (extubated pts.): Talking in normal tone or no sound CPOT Total: 0    Home Living                          Prior Function            PT Goals (current goals can now be found in the care plan section) Progress towards PT goals: Not progressing toward goals - comment    Frequency    Min 1X/week      PT Plan      Co-evaluation              AM-PAC PT "6 Clicks" Mobility   Outcome Measure  Help needed turning from your back to your side while in a flat bed without using bedrails?: Total Help needed moving from lying on your back to sitting on the side of a flat bed without using bedrails?: Total Help needed moving to and from a bed to a chair (including a wheelchair)?: Total Help needed standing up from a chair using your arms (e.g., wheelchair or bedside chair)?: Total Help needed to walk in hospital room?: Total Help needed climbing 3-5 steps with a railing? : Total 6 Click Score: 6    End of Session Equipment Utilized During Treatment: Oxygen Activity Tolerance: Patient limited by lethargy Patient left: in bed;with call bell/phone within reach;with bed alarm set   PT Visit Diagnosis: Muscle weakness (generalized) (M62.81);Difficulty in walking, not elsewhere classified (R26.2);Other symptoms and signs involving the nervous system (R29.898);Hemiplegia and hemiparesis Hemiplegia - Right/Left: Left Hemiplegia - caused by: Cerebral infarction     Time: 1430-1445 PT Time Calculation (min) (ACUTE ONLY): 15 min  Charges:    $Therapeutic Activity: 8-22 mins PT General Charges $$ ACUTE PT VISIT: 1 Visit                     [facility] PT Acute Rehabilitation Services Office [phone]    Angelina [facility] 12/13/2023, 5:26 PM

## 2023-12-13 NOTE — Progress Notes (Addendum)
 NAME:  Carolyn Russo, MRN:  [id number], DOB:  07-18-1951, LOS: 27 ADMISSION DATE:  11/16/2023, CONSULTATION DATE:  1/30 REFERRING MD:  TRH, CHIEF COMPLAINT:  code blue   History of Present Illness:  Carolyn Russo is a 73 yo female She presented to [facility] on 1/23 with confusion, left sided chest pain, shortness of breath, and coughing. She was found to be hypoxic on room air and placed on 2 L [place]. CT chest was done without evidence of PE.  MRI revealed a 1.3cm acute to early subacute right PICA infarct. Also was admitted with pneumonia due to COVID and RSV and was started on antibiotics for likely superimposed bacterial infection.    On 1/30 early morning the patient was ~2 hrs into dialysis when her blood pressure dropped. UF goal was decreased but since the patient's level of consciousnesses decreased, she received 300 cc NS bolus. EKG changes were noted and rapid response was activated, dialysis stopped, and blood returned to the patient. Around 6:30 AM the patient was noted to be apneic without a pulse and code blue was called. Patient was noted to have PEA arrest. CPR was done for a few minutes before ROSC was achieved. She was transferred to 27M and patient was intubated due to failure to protect airway.  Pertinent  Medical History  ESRD/HD MWF CHF CVA with residual LEFT sided weakness T2DM RIGHT BKA Seizures  Significant Hospital Events: Including procedures, antibiotic start and stop dates in addition to other pertinent events   1/23 admit to [facility] for acute infarct + covid/rsv pneumonia 1/30 admit to ICU after patient had PEA arrest  2/1 Failed SBT due to apnea. ABG with respiratory alkalosis. RR decreased 2/2 Tolerating SBT. Remains encephalopathic 2/5 Continues to tolerate SBT during day, but remains encephalopathic/unable to protect airway if extubated  2/9 extubated, off precedex 2/12 still lethargic, ABG 2/11 unremarkable, limit centrally acting meds 2/15 transferred back to ICU  for atrial fibrillation with rapid ventricular response that developed after a gross aspiration event.  Interim History / Subjective:  Monitor shows sinus rhythm and atrial fibrillation, rate controlled.  On amiodarone.  Awake, follows some commands.  Intermittently verbalizes single words.  Cough seems a bit stronger today but still requiring suctioning.  Follows commands.  Objective   Blood pressure (!) 142/65, pulse 77, temperature 99.3 F (37.4 C), temperature source Oral, resp. rate 18, height 5\' 3"  (1.6 m), weight 71.1 kg, SpO2 97%.    FiO2 (%):  [28 %] 28 %   Intake/Output Summary (Last 24 hours) at 12/13/2023 0800 Last data filed at 12/13/2023 0600 Gross per 24 hour  Intake 1470 ml  Output 1700 ml  Net -230 ml   Filed Weights   12/12/23 0500 12/12/23 0740 12/13/23 0500  Weight: 74 kg 74 kg 71.1 kg    Examination: Gen:     No acute distress.  Non-labored breathing. Lungs:    Good air entry.  Scattered rhonchi. CV:         regularly irregular, rate controlled. Abd:      + bowel sounds; soft, non-tender, no distension Ext:    No edema, right BKA Skin:      Warm and dry; no rash Neuro:  awake, spontaneously moving  CXR with left lower lobe atelectasis and right basilar opacities  Resolved Hospital Problem list   Hypokalemia Hypotension  Assessment & Plan:  Atrial Fibrillation with RVR, currently in normal sinus rhythm PEA arrest: due to hypotension, not primarily cardiac  in nature  Cardiology input appreciated started on amiodarone (2/15)  Acute Hypoxemic Respiratory Failure Aspiration pneumonia/pneumonitis Atelectasis Pneumonia due to COVID and RSV Productive Cough  Wean supplemental oxygen.  Maintain SpO2 93+%. Continue with NT suction PRN Chest PT. On tube feeds.  Sinusitis  MRI brain noted progressed mastoid effusions On Augmentin BID x 10 days (renal dosed).  Change IV to NG formulation (2/16).  Acute encephalopathy, slowly improving. Multifactorial:  anoxic, ischemic, toxometabolic (renal failure, medications).  Concern for cefepime toxicity given her hx of ESRD, med was discontinued 2/4. MRI brain did show a new and acute central left cerebellar infarct.  Appreciate neurology recs.  Stroke not felt to be significant enough to explain altered mental status Stopped Seroquel and amitriptyline Limit centrally acting medications as possible Last ABG without hypercarbia  2/12 CXR with pulmonary edema on HD  Hyponatremia, mild Likely related to aspiration pneumonia Monitor serum Na  ESRD on HD MWF - Nephrology help appreciated. - Discontinued Renvela due to low phos levels  Acute to early subacute right PICA infarct on 1/24, resolved New acute central left cerebellar infarct  - Appreciate Neurology input  - Continue DAPT with aspirin/Brilinta - Continue statin - Needs 30-day heart monitor at discharge  Hypertension Hypotensive initially, but now normotensive Home hydralazine at reduced dose, monitor  Nephrology help appreciated.   Abnormal C6 signal -MRI showed a nonspecific bone marrow signal of c6 vertebral body.  Unclear if related to a malignancy vs discitis/osteomyelitis.  Will hold off on C spine imaging, as family does not want to pursue this workup and priority is mental status/neuro recovery.  - If no other findings, consider C spine MRI to further evaluate  Seizure disorder - Continue home Keppra 500 mg BID  Anemia of chronic disease - 1 unit blood on 2/10, Hgb stable - trend CBC - Aranesp every Friday   Diabetes - Continue SSI - Tube feeds  DISPOSITION: OK to transfer to 4NP PROGRESSIVE from pulmonary/critical care standpoint.  Best Practice (right click and "Reselect all SmartList Selections" daily)   Diet/type: tubefeeds  DVT prophylaxis prophylactic heparin  Pressure ulcer(s): pressure ulcer assessment deferred  GI prophylaxis: H2B Lines: R subclavian HD cath,  Foley:  N/A Code Status:  full  code Last date of multidisciplinary goals of care discussion [updated husband, wishes full code. Open to talking with palliative care]   Critical care time: - minutes   Marcelle Smiling, MD Board Certified by the ABIM, Pulmonary Diseases & Critical Care Medicine  PCCM on call pager [phone] until 7pm. Please call Elink 7p-7a. [phone]

## 2023-12-13 NOTE — Plan of Care (Signed)
  Problem: Coping: Goal: Ability to adjust to condition or change in health will improve Outcome: Progressing   Problem: Fluid Volume: Goal: Ability to maintain a balanced intake and output will improve Outcome: Progressing   Problem: Health Behavior/Discharge Planning: Goal: Ability to identify and utilize available resources and services will improve Outcome: Progressing Goal: Ability to manage health-related needs will improve Outcome: Progressing   Problem: Metabolic: Goal: Ability to maintain appropriate glucose levels will improve Outcome: Progressing   Problem: Nutritional: Goal: Maintenance of adequate nutrition will improve Outcome: Progressing Goal: Progress toward achieving an optimal weight will improve Outcome: Progressing   Problem: Skin Integrity: Goal: Risk for impaired skin integrity will decrease Outcome: Progressing   Problem: Education: Goal: Knowledge of disease or condition will improve Outcome: Progressing Goal: Knowledge of secondary prevention will improve (MUST DOCUMENT ALL) Outcome: Progressing Goal: Knowledge of patient specific risk factors will improve (DELETE if not current risk factor) Outcome: Progressing   Problem: Ischemic Stroke/TIA Tissue Perfusion: Goal: Complications of ischemic stroke/TIA will be minimized Outcome: Progressing   Problem: Coping: Goal: Will verbalize positive feelings about self Outcome: Progressing Goal: Will identify appropriate support needs Outcome: Progressing   Problem: Health Behavior/Discharge Planning: Goal: Ability to manage health-related needs will improve Outcome: Progressing Goal: Goals will be collaboratively established with patient/family Outcome: Progressing   Problem: Self-Care: Goal: Ability to participate in self-care as condition permits will improve Outcome: Progressing Goal: Verbalization of feelings and concerns over difficulty with self-care will improve Outcome:  Progressing Goal: Ability to communicate needs accurately will improve Outcome: Progressing   Problem: Nutrition: Goal: Dietary intake will improve Outcome: Progressing   Problem: Education: Goal: Knowledge of risk factors and measures for prevention of condition will improve Outcome: Progressing   Problem: Coping: Goal: Psychosocial and spiritual needs will be supported Outcome: Progressing   Problem: Respiratory: Goal: Will maintain a patent airway Outcome: Progressing Goal: Complications related to the disease process, condition or treatment will be avoided or minimized Outcome: Progressing   Problem: Education: Goal: Knowledge of General Education information will improve Description: Including pain rating scale, medication(s)/side effects and non-pharmacologic comfort measures Outcome: Progressing   Problem: Health Behavior/Discharge Planning: Goal: Ability to manage health-related needs will improve Outcome: Progressing   Problem: Clinical Measurements: Goal: Ability to maintain clinical measurements within normal limits will improve Outcome: Progressing Goal: Will remain free from infection Outcome: Progressing Goal: Diagnostic test results will improve Outcome: Progressing Goal: Respiratory complications will improve Outcome: Progressing Goal: Cardiovascular complication will be avoided Outcome: Progressing   Problem: Activity: Goal: Risk for activity intolerance will decrease Outcome: Progressing   Problem: Nutrition: Goal: Adequate nutrition will be maintained Outcome: Progressing   Problem: Coping: Goal: Level of anxiety will decrease Outcome: Progressing   Problem: Elimination: Goal: Will not experience complications related to bowel motility Outcome: Progressing Goal: Will not experience complications related to urinary retention Outcome: Progressing   Problem: Pain Managment: Goal: General experience of comfort will improve and/or be  controlled Outcome: Progressing   Problem: Safety: Goal: Ability to remain free from injury will improve Outcome: Progressing   Problem: Skin Integrity: Goal: Risk for impaired skin integrity will decrease Outcome: Progressing   Problem: Activity: Goal: Ability to tolerate increased activity will improve Outcome: Progressing   Problem: Respiratory: Goal: Ability to maintain a clear airway and adequate ventilation will improve Outcome: Progressing   Problem: Role Relationship: Goal: Method of communication will improve Outcome: Progressing

## 2023-12-13 NOTE — Progress Notes (Signed)
 [facility] Progress Note   Subjective:    Pt awake, NG tube in  Had 1.7 L off yest , had rapid afib towards the end  Objective Vitals:   12/13/23 0730 12/13/23 0800 12/13/23 0830 12/13/23 1112  BP: (!) 147/52 (!) 142/52 (!) 142/52   Pulse: 81 80 80   Resp: (!) 26 (!) 23 (!) 21   Temp:    99.2 F (37.3 C)  TempSrc:    Oral  SpO2: 96% 100% 100%   Weight:      Height:       Physical Exam General: Chronically ill-appearing Heart: S1 and S2; No murmurs, gallops, or rubs Lungs: rhonchi bilat are less dramatic today Abdomen: Soft and non-tender Extremities:No LE edema Dialysis Access: R femoral TDC   Dialysis Orders: MWF NW  4h   B350    75.5kg   2/2 bath   R fem TDC  Heparin 2600 + 1400 midrun - last HD 1/22, getting to dry wt - venofer 100 qhd thru 1/31 - mircera 150 q2, last 1/17, due 1/31  Renal-related home meds: - rocaltrol 0.5 mwf - sensipar 30 every day - hydralazine 100 tid - losartan 50 bid - renvela 800mg  ac tid - others: brilinta, crestor, keppra, ritalin, neurontin, asa, PPI, dulera, insulin, albuterol, elavil  CXR 2/15 - no change, small R effusion, no edema/ congestion / infiltrates   Assessment/Plan: AHRF/ +COVID +RSV pna/ volume^: SP course of abx + HD. Extubated 2/09.  CVA: R then L cerebellar infarcts d/t small vessel disease. Per neurology.  On  DAPT AMS: multifactorial. Very slow improvement.  Aspiration pneumonitis - CXR clear 2/15. Moved back to ICU. ESRD: on HD MWF. Keep TTS schedule this week. Hd tomorrow.   BP: bp's labile, 95/70- 130/ 50 Volume: BP's labile 95- 135/ 48- 75. CXR completely clear on 2/15. UF 0- 1 L w/ HD Anemia of esrd: Hb is down to 8-9 range. Weekly sq darbe qFri. Tsat 26% and ferritin 1638. Pharm rec'd po ferrous sulfate daily.  Transfuse as needed Secondary hyperparathyroidism - phos in range. Calcium up when corrected, not on vdra.  Resume binders when taking po H/o seizure d/o - on Keppra PEA  arrest  Vinson Moselle  MD  [facility] 12/13/2023, 12:12 PM  Recent Labs  Lab 12/08/23 0352 12/09/23 0538 12/11/23 1620 12/12/23 0531 12/13/23 0758  HGB 9.6* 9.2*  --  8.6* 8.1*  ALBUMIN 2.1* 2.1*  --   --   --   CALCIUM 9.1 8.9   < > 9.7 8.8*  PHOS 2.3* 2.0*  --   --   --   CREATININE 3.75* 2.76*   < > 5.90* 4.12*  K 4.5 4.4   < > 5.0 4.7   < > = values in this interval not displayed.    Inpatient medications:  amiodarone  200 mg Per Tube BID   amoxicillin-clavulanate  1 tablet Per Tube Q12H   arformoterol  15 mcg Nebulization BID   aspirin  81 mg Per Tube Daily   budesonide (PULMICORT) nebulizer solution  0.5 mg Nebulization BID   Chlorhexidine Gluconate Cloth  6 each Topical Q0600   darbepoetin (ARANESP) injection - DIALYSIS  150 mcg Subcutaneous Q Mon-1800   ferrous sulfate  300 mg Per Tube Daily   heparin injection (subcutaneous)  5,000 Units Subcutaneous Q8H   hydrALAZINE  50 mg Per Tube Q8H   insulin aspart  0-15 Units Subcutaneous Q4H   insulin aspart  4  Units Subcutaneous Q4H   insulin glargine-yfgn  5 Units Subcutaneous Daily   levETIRAcetam  500 mg Per Tube BID   multivitamin  1 tablet Per Tube QHS   nutrition supplement (JUVEN)  1 packet Per Tube BID BM   mouth rinse  15 mL Mouth Rinse Q4H   rosuvastatin  10 mg Per Tube Daily   sodium chloride flush  3 mL Intravenous Q12H   ticagrelor  90 mg Per Tube BID    anticoagulant sodium citrate     feeding supplement (VITAL 1.5 CAL) 45 mL/hr at 12/13/23 0800   acetaminophen **OR** acetaminophen, alteplase, anticoagulant sodium citrate, docusate, feeding supplement (NEPRO CARB STEADY), heparin, ipratropium-albuterol, labetalol, lidocaine (PF), lidocaine-prilocaine, ondansetron (ZOFRAN) IV, mouth rinse, pentafluoroprop-tetrafluoroeth, polyethylene glycol, Racepinephrine HCl, white petrolatum, witch hazel-glycerin

## 2023-12-14 DIAGNOSIS — G9340 Encephalopathy, unspecified: Secondary | ICD-10-CM | POA: Diagnosis not present

## 2023-12-14 DIAGNOSIS — Z515 Encounter for palliative care: Secondary | ICD-10-CM | POA: Diagnosis not present

## 2023-12-14 DIAGNOSIS — J189 Pneumonia, unspecified organism: Secondary | ICD-10-CM | POA: Diagnosis not present

## 2023-12-14 DIAGNOSIS — Z7189 Other specified counseling: Secondary | ICD-10-CM | POA: Diagnosis not present

## 2023-12-14 LAB — CBC
HCT: 24.7 % — ABNORMAL LOW (ref 36.0–46.0)
Hemoglobin: 8.4 g/dL — ABNORMAL LOW (ref 12.0–15.0)
MCH: 30.8 pg (ref 26.0–34.0)
MCHC: 34 g/dL (ref 30.0–36.0)
MCV: 90.5 fL (ref 80.0–100.0)
Platelets: 535 10*3/uL — ABNORMAL HIGH (ref 150–400)
RBC: 2.73 MIL/uL — ABNORMAL LOW (ref 3.87–5.11)
RDW: 17.5 % — ABNORMAL HIGH (ref 11.5–15.5)
WBC: 24.7 10*3/uL — ABNORMAL HIGH (ref 4.0–10.5)
nRBC: 0.2 % (ref 0.0–0.2)

## 2023-12-14 LAB — BASIC METABOLIC PANEL
Anion gap: 11 (ref 5–15)
BUN: 83 mg/dL — ABNORMAL HIGH (ref 8–23)
CO2: 27 mmol/L (ref 22–32)
Calcium: 9.4 mg/dL (ref 8.9–10.3)
Chloride: 99 mmol/L (ref 98–111)
Creatinine, Ser: 5.1 mg/dL — ABNORMAL HIGH (ref 0.44–1.00)
GFR, Estimated: 8 mL/min — ABNORMAL LOW (ref >60)
Glucose, Bld: 215 mg/dL — ABNORMAL HIGH (ref 70–99)
Potassium: 5.3 mmol/L — ABNORMAL HIGH (ref 3.5–5.1)
Sodium: 137 mmol/L (ref 135–145)

## 2023-12-14 LAB — GLUCOSE, CAPILLARY
Glucose-Capillary: 118 mg/dL — ABNORMAL HIGH (ref 70–99)
Glucose-Capillary: 189 mg/dL — ABNORMAL HIGH (ref 70–99)
Glucose-Capillary: 199 mg/dL — ABNORMAL HIGH (ref 70–99)
Glucose-Capillary: 208 mg/dL — ABNORMAL HIGH (ref 70–99)
Glucose-Capillary: 208 mg/dL — ABNORMAL HIGH (ref 70–99)
Glucose-Capillary: 220 mg/dL — ABNORMAL HIGH (ref 70–99)

## 2023-12-14 MED ORDER — HEPARIN SODIUM (PORCINE) 1000 UNIT/ML IJ SOLN
INTRAMUSCULAR | Status: AC
  Filled 2023-12-14: qty 5

## 2023-12-14 MED ORDER — HEPARIN SODIUM (PORCINE) 1000 UNIT/ML DIALYSIS
2500.0000 [IU] | Freq: Once | INTRAMUSCULAR | Status: AC
  Administered 2023-12-14: 2500 [IU] via INTRAVENOUS_CENTRAL

## 2023-12-14 MED ORDER — HEPARIN SODIUM (PORCINE) 1000 UNIT/ML IJ SOLN
INTRAMUSCULAR | Status: AC
  Filled 2023-12-14: qty 3

## 2023-12-14 MED ORDER — BANATROL TF EN LIQD
60.0000 mL | Freq: Two times a day (BID) | ENTERAL | Status: DC
  Administered 2023-12-14 – 2023-12-21 (×16): 60 mL
  Filled 2023-12-14 (×16): qty 60

## 2023-12-14 NOTE — Progress Notes (Signed)
 PROGRESS NOTE  LOLLIE GUNNER  DOB: 1951/08/27  PCP: Annita Brod, MD ZOX:[id number]  DOA: 11/16/2023  LOS: 28 days  Hospital Day: 29  Brief narrative: [name] is a 73 y.o. female with PMH significant for ESRD-HD-MWF, DM2, HTN, HLD, CHF, CVA, dysphagia, GERD, migraine 1/23, patient presented to ED with confusion, left-sided chest pain, shortness of breath, coughing.  She was found to be hypoxic. CT chest rule out PE but showed bibasilar consolidations Resp virus panel positive for COVID and RSV She was also suspected of having secondary bacterial infection and was started on IV antibiotics. CT head on admission also showed a right cerebellar infarct. Admitted to [facility]  1/30, she had a PEA arrest while in dialysis.  ROSC was achieved in few minutes.  Intubated to protect airway and transferred to ICU.  Needed Precedex drip as well 2/5, had new and Acute central left Cerebellar Infarct with no associated hemorrhage or mass effect. 2/9 extubated, off precedex  2/15, transferred to [facility] but suffered a gross aspiration event leading to A-fib with RVR and hemodynamic instability.  Transferred back to ICU.  Rate controlled since blood dysphagia persists.  Tube feeding started 2/20, transferred out to [facility].    Palliative following.  Remains full code.  Subjective: Patient was seen and examined this morning in dialysis unit. Labs were required.  Unable to follow any command.  On low-flow oxygen. Chart reviewed. In the last 24 hours, no fever, heart rate in 70s, blood pressure in 140s and 150s, tachypneic in 20s and low 30s, on 2 L oxygen nasal cannula.  Assessment and plan: Acute hypoxemic respiratory failure Pneumonia due to COVID and RSV Aspiration pneumonia Timeline of events as above. Was intubated in ICU.  Currently on 2 L oxygen by nasal cannula. Chest PT to continue  Acute right PICA infarct on 1/24 New acute central left cerebellar infarct - 2/5 Stroke workup completed.    Per last neurology recommendation on 2/7, continue DAPT with aspirin 81 mg daily and Brilinta 90 mg twice daily. Continue statin Needs 30-day heart monitor at discharge  Dysphagia Secondary to stroke Currently on Cortrak feeding. Speech therapy following. Continue SSI with Accu-Cheks.  A1c 5.8 on 10/26/2023 Recent Labs  Lab 12/13/23 1511 12/13/23 1911 12/13/23 2338 12/14/23 0325 12/14/23 0754  GLUCAP 188* 140* 166* 199* 189*   PEA arrest -1/30 Secondary to hypotension while in dialysis.  Atrial Fibrillation with RVR -2/5 Secondary to a big aspiration event. Seen by cardiology.  Started on amiodarone Currently in normal sinus rhythm  Acute encephalopathy Multifactorial: anoxic, ischemic, toxometabolic (renal failure, medications).  Concern for cefepime toxicity given her hx of ESRD, med was discontinued 2/4. Stopped Seroquel and amitriptyline Limit centrally acting medications as possible   ESRD on HD MWF Hyperkalemia Potassium level elevated to 5.3 today.  On dialysis today.   Nephrology following.   Recent Labs  Lab 12/08/23 0352 12/09/23 0538 12/11/23 1620 12/12/23 0531 12/13/23 0758 12/13/23 0816 12/14/23 0326  K 4.5 4.4 5.2* 5.0 4.7  --  5.3*  MG  --  2.0 2.2  --   --  2.2  --   PHOS 2.3* 2.0*  --   --   --   --   --     Hypertension Hypotensive initially, but now normotensive Continue Home hydralazine at reduced dose, monitor    Anemia of chronic disease 1 unit blood on 2/10, Hgb stable Hemoglobin currently stable between 8 and 9. Aranesp every Friday  Recent  Labs    12/01/23 1130 12/02/23 0516 12/08/23 0352 12/09/23 0538 12/12/23 0531 12/13/23 0758 12/14/23 0326  HGB  --    < > 9.6* 9.2* 8.6* 8.1* 8.4*  MCV  --    < > 87.5 89.3 88.1 89.8 90.5  FERRITIN 1,638*  --   --   --   --   --   --   TIBC 108*  --   --   --   --   --   --   IRON 28  --   --   --   --   --   --    < > = values in this interval not displayed.   Sinusitis  MRI brain  noted progressed mastoid effusions On Augmentin BID x 10 days (renal dosed).  Change IV to NG formulation (2/16).   Abnormal C6 signal -MRI showed a nonspecific bone marrow signal of c6 vertebral body.  Unclear if related to a malignancy vs discitis/osteomyelitis.  Will hold off on C spine imaging, as family does not want to pursue this workup and priority is mental status/neuro recovery.  If no other findings, consider C spine MRI to further evaluate   Seizure disorder -Continue home Keppra 500 mg BID    Mobility: PT eval may help after mental status improvement.  Goals of care   Code Status: Full Code  Palliative care following.   DVT prophylaxis:  SCDs Start: 11/23/23 0716 heparin injection 5,000 Units Start: 11/17/23 0930   Antimicrobials: Completed the course Fluid: None Consultants: Palliative care, nephrology Family Communication: None at bedside  Status: Inpatient Level of care:  Progressive   Patient is from: Home Needs to continue in-hospital care: Mental status not improving  Anticipated d/c to: Pending clinical course    Diet:  Diet Order             Diet NPO time specified  Diet effective now                   Scheduled Meds:  amiodarone  200 mg Per Tube BID   amoxicillin-clavulanate  1 tablet Per Tube Q12H   arformoterol  15 mcg Nebulization BID   aspirin  81 mg Per Tube Daily   budesonide (PULMICORT) nebulizer solution  0.5 mg Nebulization BID   Chlorhexidine Gluconate Cloth  6 each Topical Q0600   Chlorhexidine Gluconate Cloth  6 each Topical Q0600   darbepoetin (ARANESP) injection - DIALYSIS  150 mcg Subcutaneous Q Mon-1800   ferrous sulfate  300 mg Per Tube Daily   heparin injection (subcutaneous)  5,000 Units Subcutaneous Q8H   heparin sodium (porcine)       heparin sodium (porcine)       hydrALAZINE  50 mg Per Tube Q8H   insulin aspart  0-15 Units Subcutaneous Q4H   insulin aspart  4 Units Subcutaneous Q4H   insulin glargine-yfgn  5  Units Subcutaneous Daily   levETIRAcetam  500 mg Per Tube BID   multivitamin  1 tablet Per Tube QHS   nutrition supplement (JUVEN)  1 packet Per Tube BID BM   mouth rinse  15 mL Mouth Rinse Q4H   rosuvastatin  10 mg Per Tube Daily   sodium chloride flush  3 mL Intravenous Q12H   ticagrelor  90 mg Per Tube BID    PRN meds: acetaminophen **OR** acetaminophen, alteplase, anticoagulant sodium citrate, docusate, feeding supplement (NEPRO CARB STEADY), heparin, heparin sodium (porcine), heparin sodium (porcine), ipratropium-albuterol, labetalol,  lidocaine (PF), lidocaine-prilocaine, ondansetron (ZOFRAN) IV, mouth rinse, pentafluoroprop-tetrafluoroeth, polyethylene glycol, Racepinephrine HCl, white petrolatum, witch hazel-glycerin   Infusions:   anticoagulant sodium citrate     feeding supplement (VITAL 1.5 CAL) Stopped (12/14/23 0813)    Antimicrobials: Anti-infectives (From admission, onward)    Start     Dose/Rate Route Frequency Ordered Stop   12/11/23 1000  amoxicillin-clavulanate (AUGMENTIN) 500-125 MG per tablet 1 tablet        1 tablet Per Tube Every 12 hours 12/10/23 2122 12/15/23 2359   12/10/23 1000  amoxicillin-clavulanate (AUGMENTIN) 500-125 MG per tablet 1 tablet  Status:  Discontinued        1 tablet Oral Every 12 hours 12/10/23 0850 12/10/23 2122   12/09/23 2200  Ampicillin-Sulbactam (UNASYN) 3 g in sodium chloride 0.9 % 100 mL IVPB  Status:  Discontinued        3 g 200 mL/hr over 30 Minutes Intravenous Every 12 hours 12/09/23 1136 12/10/23 0850   12/06/23 1130  amoxicillin-clavulanate (AUGMENTIN) 400-57 MG/5ML suspension 500 mg  Status:  Discontinued        500 mg Per Tube Every 12 hours 12/06/23 1031 12/09/23 1136   12/06/23 1115  amoxicillin-clavulanate (AUGMENTIN) 250-62.5 MG/5ML suspension 500 mg  Status:  Discontinued        500 mg Oral Every 12 hours 12/06/23 1015 12/06/23 1018   12/06/23 1115  amoxicillin-clavulanate (AUGMENTIN) 250-62.5 MG/5ML suspension 500 mg   Status:  Discontinued        500 mg Per Tube Every 12 hours 12/06/23 1018 12/06/23 1031   11/30/23 1800  ceFEPIme (MAXIPIME) 1 g in sodium chloride 0.9 % 100 mL IVPB  Status:  Discontinued        1 g 200 mL/hr over 30 Minutes Intravenous Every 24 hours 11/28/23 0933 11/28/23 1010   11/27/23 1200  vancomycin (VANCOREADY) IVPB 750 mg/150 mL  Status:  Discontinued        750 mg 150 mL/hr over 60 Minutes Intravenous Every M-W-F (Hemodialysis) 11/26/23 1250 11/27/23 0456   11/27/23 1200  vancomycin (VANCOREADY) IVPB 500 mg/100 mL  Status:  Discontinued        500 mg 100 mL/hr over 60 Minutes Intravenous Every M-W-F (Hemodialysis) 11/27/23 0456 11/27/23 0457   11/27/23 1200  vancomycin (VANCOCIN) 750 mg in sodium chloride 0.9 % 250 mL IVPB  Status:  Discontinued        750 mg 250 mL/hr over 60 Minutes Intravenous Every M-W-F (Hemodialysis) 11/27/23 0457 11/27/23 0853   11/25/23 1800  vancomycin (VANCOREADY) IVPB 750 mg/150 mL  Status:  Discontinued        750 mg 150 mL/hr over 60 Minutes Intravenous  Once 11/24/23 1015 11/25/23 1401   11/25/23 1800  vancomycin (VANCOCIN) 750 mg in sodium chloride 0.9 % 250 mL IVPB        750 mg 250 mL/hr over 60 Minutes Intravenous  Once 11/25/23 1401 11/26/23 0510   11/24/23 2000  ceFEPIme (MAXIPIME) 2 g in sodium chloride 0.9 % 100 mL IVPB  Status:  Discontinued        2 g 200 mL/hr over 30 Minutes Intravenous Every M-W-F (2000) 11/23/23 1058 11/24/23 1015   11/24/23 1800  ceFEPIme (MAXIPIME) 1 g in sodium chloride 0.9 % 100 mL IVPB  Status:  Discontinued        1 g 200 mL/hr over 30 Minutes Intravenous Every 24 hours 11/24/23 1015 11/28/23 0933   11/24/23 1200  vancomycin (VANCOREADY) IVPB 750 mg/150 mL  Status:  Discontinued        750 mg 150 mL/hr over 60 Minutes Intravenous Every M-W-F (Hemodialysis) 11/23/23 1451 11/24/23 0453   11/24/23 1200  vancomycin (VANCOCIN) 750 mg in sodium chloride 0.9 % 250 mL IVPB  Status:  Discontinued        750 mg 250  mL/hr over 60 Minutes Intravenous Every M-W-F (Hemodialysis) 11/24/23 0456 11/24/23 1015   11/24/23 1015  vancomycin variable dose per unstable renal function (pharmacist dosing)  Status:  Discontinued         Does not apply See admin instructions 11/24/23 1015 11/27/23 0853   11/24/23 0453  vancomycin (VANCOREADY) IVPB 750 mg/150 mL  Status:  Discontinued        750 mg 150 mL/hr over 60 Minutes Intravenous Every Dialysis 11/24/23 0453 11/24/23 0456   11/23/23 1130  vancomycin (VANCOREADY) IVPB 1750 mg/350 mL        1,750 mg 175 mL/hr over 120 Minutes Intravenous  Once 11/23/23 1035 11/23/23 1908   11/23/23 1130  ceFEPIme (MAXIPIME) 2 g in sodium chloride 0.9 % 100 mL IVPB        2 g 200 mL/hr over 30 Minutes Intravenous  Once 11/23/23 1035 11/23/23 1900   11/23/23 1028  vancomycin variable dose per unstable renal function (pharmacist dosing)  Status:  Discontinued         Does not apply See admin instructions 11/23/23 1028 11/23/23 1451   11/16/23 2300  azithromycin (ZITHROMAX) 500 mg in sodium chloride 0.9 % 250 mL IVPB        500 mg 250 mL/hr over 60 Minutes Intravenous Every 24 hours 11/16/23 2112 11/18/23 2300   11/16/23 2200  cefTRIAXone (ROCEPHIN) 2 g in sodium chloride 0.9 % 100 mL IVPB  Status:  Discontinued        2 g 200 mL/hr over 30 Minutes Intravenous Every 24 hours 11/16/23 2112 11/22/23 1032       Objective: Vitals:   12/14/23 1100 12/14/23 1130  BP: (!) 123/49 126/60  Pulse: 70 74  Resp: (!) 22 (!) 22  Temp:    SpO2: 100% 100%    Intake/Output Summary (Last 24 hours) at 12/14/2023 1156 Last data filed at 12/14/2023 0800 Gross per 24 hour  Intake 945 ml  Output 900 ml  Net 45 ml   Filed Weights   12/12/23 0740 12/13/23 0500 12/14/23 0332  Weight: 74 kg 71.1 kg 72.4 kg   Weight change: -1.6 kg Body mass index is 28.27 kg/m.   Physical Exam: General exam: Elderly African-American female,  Skin: No rashes, lesions or ulcers. HEENT: Atraumatic,  normocephalic, no obvious bleeding.  Cortrak feeding ongoing Lungs: Clear to auscultation bilaterally, diminished air entry in both bases CVS: S1, S2, no murmur,   GI/Abd: Soft, nontender, nondistended, bowel sound present,   CNS: Able to open eyes on command.  Unable to follow any, Psychiatry: Sad affect Extremities: Since her chronic bilateral nonpitting edema, no calf tenderness,   Data Review: I have personally reviewed the laboratory data and studies available.  F/u labs  Unresulted Labs (From admission, onward)     Start     Ordered   12/13/23 0757  Calcium, ionized  Once,   R       Question:  Specimen collection method  Answer:  Lab=Lab collect   12/13/23 0757            Total time spent in review of labs and imaging, patient evaluation, formulation of plan,  documentation and communication with family: 55 minutes  Signed, Lorin Glass, MD [facility] 12/14/2023

## 2023-12-14 NOTE — Progress Notes (Signed)
 OT Cancellation Note  Patient Details Name: Carolyn Russo MRN: [id number] DOB: 10-Oct-1951   Cancelled Treatment:    Reason Eval/Treat Not Completed: Patient at procedure or test/ unavailable Undergoing HD. Will follow up for OT session as schedule permits.   Lorre Munroe 12/14/2023, 9:10 AM

## 2023-12-14 NOTE — Progress Notes (Signed)
   12/14/23 1159  Vitals  Pulse Rate 77  Resp (!) 23  BP (!) 138/43 (Simultaneous filing. User may not have seen previous data.)  SpO2 98 % (Simultaneous filing. User may not have seen previous data.)  O2 Device Nasal Cannula  Oxygen Therapy  O2 Flow Rate (L/min) 2 L/min  Patient Activity (if Appropriate) In bed  Pulse Oximetry Type Continuous  Post Treatment  Dialyzer Clearance Clear  Tolerated HD Treatment Yes   Received patient in bed to unit.  Informed consent signed and in chart.   TX duration:3.25 hours  Patient tolerated well.  Transported back to the room without acute distress.  Hand-off given to patient's nurse.   Access used: R CVC Access issues: None  Total UF removed: Medication(s) given: See MAR    Laqueta Due, RN [facility]

## 2023-12-14 NOTE — Progress Notes (Signed)
 Occupational Therapy Treatment Patient Details Name: Carolyn Russo MRN: [id number] DOB: 16-Jun-1951 Today's Date: 12/14/2023   History of present illness Pt is a 73 yo female presenting to [facility] on 11/16/23 for chest pain, weakness, and confusion. CT head revealed infarct in the inferior right cerebellum. MRI brain with acute right inferomedial cerebellar ischemic infarction in the right PICA territory. Pt also diagnosed with Covid/RSV. PEA arrest 1/30. Extubated 2/9. PMH: R BKA on 7/26, DM2, HTN, HLD, ESRD on HD MWF, hx of CVA 03/2021 & 05/2022, asthma, CHF, dysphagia, heart murmur, seizure   OT comments  Pt alert on entry w/ eyes open for entire session. Pt moving RUE throughout session though minimal command following noted. [name] at bedside and supportive. Encouraged spouse to engage pt in passive stretching of LUE/LE to prevent contractures w/ immobility (especially L hand). Patient will benefit from continued inpatient follow up therapy, <3 hours/day vs LTACH setting pending ability to tolerate HD in chair.       If plan is discharge home, recommend the following:  A lot of help with bathing/dressing/bathroom;Two people to help with walking and/or transfers;Assistance with cooking/housework;Direct supervision/assist for medications management;Assistance with feeding;Direct supervision/assist for financial management;Assist for transportation;Help with stairs or ramp for entrance   Equipment Recommendations  Hoyer lift;Hospital bed    Recommendations for Other Services      Precautions / Restrictions Precautions Precautions: Fall Recall of Precautions/Restrictions: Impaired Precaution/Restrictions Comments: Monitor O2/RR, cortrak, L hemi Required Braces or Orthoses: Other Brace Other Brace: RLE prosthetic Restrictions Weight Bearing Restrictions Per Provider Order: No       Mobility Bed Mobility Overal bed mobility: Needs Assistance             General bed mobility  comments: Total A to reposition trunk to midline in bed    Transfers                         Balance                                           ADL either performed or assessed with clinical judgement   ADL Overall ADL's : Needs assistance/impaired     Grooming: Maximal assistance;Bed level;Wash/dry face Grooming Details (indicate cue type and reason): with hand over hand, able to hold washcloth with assist to initiate elbow flexion. pt able to activate muscles to wipe R eye but despite multimodal cues, did not initiate washing other parts of face                                    Extremity/Trunk Assessment Upper Extremity Assessment Upper Extremity Assessment: Right hand dominant;RUE deficits/detail;LUE deficits/detail RUE Deficits / Details: pt reaching up to face and above head automatically. able to hold washcloth with some assist LUE Deficits / Details: no AROM noted. Pt noted to have L hand clenched in fist but able to be stretched - educated spouse on stretching this UE and ensuring digits are positioned in extension to avoid contracture. Educated on if tightness noted, resting hand splint to be considered- may also benefit preventatively - to be monitored LUE Sensation: decreased light touch LUE Coordination: decreased fine motor;decreased gross motor   Lower Extremity Assessment Lower Extremity Assessment: Defer to PT evaluation  Vision   Vision Assessment?: No apparent visual deficits   Perception     Praxis     Communication Communication Communication: Impaired Factors Affecting Communication: Difficulty expressing self;Reduced clarity of speech   Cognition Arousal: Alert Behavior During Therapy: Flat affect Cognition: Difficult to assess Difficult to assess due to: Impaired communication           OT - Cognition Comments: eyes open on entry, follows < 25% of commands. did answer "yes" when asked if  feeling ok and if visitor present was her husband. However, pt then repeats "yes" x 3-4 more times. does visually track when name called to B sides.                 Following commands: Impaired Following commands impaired: Follows one step commands inconsistently, Follows one step commands with increased time      Cueing   Cueing Techniques: Verbal cues, Gestural cues, Tactile cues, Visual cues  Exercises Exercises: Other exercises Other Exercises Other Exercises: PROM LUE w/ verbal education to spouse at bedside Other Exercises: PROM LLE knee flexion/extension, ankle flexion/extension    Shoulder Instructions       General Comments Husband at bedside. Spo2 87% on RA - replaced with 2 L O2 and high 80s, low 90s noted. No w/d noted to stimuli at bottom of foot (husband reports pt typically ticklish)    Pertinent Vitals/ Pain       Pain Assessment Pain Assessment: Faces Faces Pain Scale: No hurt Pain Intervention(s): Monitored during session  Home Living                                          Prior Functioning/Environment              Frequency  Min 1X/week        Progress Toward Goals  OT Goals(current goals can now be found in the care plan section)  Progress towards OT goals: OT to reassess next treatment  Acute Rehab OT Goals Patient Stated Goal: husband hopeful for pt to sit up in chair (in prep for HD tolerance), be able to transfer OOB again OT Goal Formulation: With family Time For Goal Achievement: 12/21/23 Potential to Achieve Goals: Fair ADL Goals Pt Will Perform Grooming: with mod assist;bed level Pt Will Perform Lower Body Bathing: sitting/lateral leans;with min assist Pt Will Perform Upper Body Dressing: with min assist;sitting Pt Will Perform Lower Body Dressing: with mod assist;sitting/lateral leans Pt Will Transfer to Toilet: with min assist;with +2 assist;stand pivot transfer;bedside commode Pt/caregiver will Perform  Home Exercise Program: Increased strength;Increased ROM;Both right and left upper extremity;With written HEP provided Additional ADL Goal #1: Pt to follow one step commands > 50% of the time Additional ADL Goal #2: Pt to maintain sitting balance EOB > 2 min with no more than Mod A during functional tasks  Plan      Co-evaluation                 AM-PAC OT "6 Clicks" Daily Activity     Outcome Measure   Help from another person eating meals?: Total Help from another person taking care of personal grooming?: A Lot Help from another person toileting, which includes using toliet, bedpan, or urinal?: Total Help from another person bathing (including washing, rinsing, drying)?: Total Help from another person to put on and taking off regular upper  body clothing?: Total Help from another person to put on and taking off regular lower body clothing?: Total 6 Click Score: 7    End of Session Equipment Utilized During Treatment: Oxygen  OT Visit Diagnosis: Muscle weakness (generalized) (M62.81);Other symptoms and signs involving cognitive function;Pain;Other abnormalities of gait and mobility (R26.89);Unsteadiness on feet (R26.81);Other (comment) Hemiplegia - Right/Left: Left Hemiplegia - dominant/non-dominant: Non-Dominant Hemiplegia - caused by: Cerebral infarction   Activity Tolerance Patient limited by fatigue;Patient tolerated treatment well   Patient Left in bed;with call bell/phone within reach;with family/visitor present   Nurse Communication Mobility status        Time: 1345-1411 OT Time Calculation (min): 26 min  Charges: OT General Charges $OT Visit: 1 Visit OT Treatments $Therapeutic Activity: 8-22 mins $Therapeutic Exercise: 8-22 mins  Bradd Canary, OTR/L Acute Rehab Services Office: [phone]   Lorre Munroe 12/14/2023, 2:32 PM

## 2023-12-14 NOTE — Progress Notes (Addendum)
 Nutrition Follow-up  DOCUMENTATION CODES:  Not applicable  INTERVENTION:  Continue TF via cortrak: Vital 1.5 at 45 ml/h (1080 ml per day) Provides 1620 kcal, 73 gm protein, 825 ml free water daily Juven BID, each packet provides 95 calories, 2.5 grams of protein (collagen), and micronutrients to support wound healing Renal MVI daily  NUTRITION DIAGNOSIS:  Inadequate oral intake related to acute illness as evidenced by NPO status.  Ongoing   GOAL:  Patient will meet greater than or equal to 90% of their needs  Met with TF   MONITOR:  TF tolerance, I & O's, Labs, Weight trends  REASON FOR ASSESSMENT:  Ventilator    ASSESSMENT:  Pt  presented 1/23 with confusion and worsening L chest pain secondary to COVID and RSV infection. PMH significant for ESRD on HD, CHF, CVA with residual L sided weakness, T2DM, R BKA, seizure disorder.  1/23: MRI brain- revealed 1.3 cm acute to early subacute R PICA distribution infarct 1/28: MBS recommend dysphagia 2, thin liquids 1/30: PEA arrest; intubated; NPO  2/4: MRI brain- resolution of small R PICA cerebellar infarct, new/acute central L cerebellar infarct with no associated hemorrhage   2/9: extubated 2/15: transferred back to ICU after aspiration event 2/17: TF resumed and advanced to goal rate, Vital 1.5 at 45 ml/h 2/18: FEES with SLP; recommends continuing NPO status as patient silently aspirated purees and thin liquids.   Patient is currently receiving Vital 1.5 via Cortrak at 45 ml/h (1080 ml/day) to provide 1620 kcals, 73 gm protein, 825 ml free water daily. Tolerating without difficulty.   Remains NPO. SLP following for ability to begin PO diet.   Weight trending down: Admit weight: 79.5 kg   Current weight: 71.3 kg EDW prior to admission: 75.5 kg    Intake/Output Summary (Last 24 hours) at 12/14/2023 1317 Last data filed at 12/14/2023 1300 Gross per 24 hour  Intake 879 ml  Output 1900 ml  Net -1021 ml  Net IO Since  Admission: 4,742.69 mL [12/14/23 1317]   Drains/Lines: Cortrak (distal stomach) Right AV fistula Rectal tube with 900 ml output x 24 hours  Last HD this morning with 1,000 ml fluid removed.  Medications reviewed and include aranesp, ferrous sulfate, novolog, semglee, keppra, rena-vit, Juven.  Labs reviewed. K 5.3 CBG: [phone]  Diet Order:   Diet Order             Diet NPO time specified  Diet effective now                  EDUCATION NEEDS:  No education needs have been identified at this time  Skin:  Skin Assessment: Skin Integrity Issues: Skin Integrity Issues:: Stage II Stage II: medial sacrum  Last BM:  2/19 type 7, rectal tube  Height:  Ht Readings from Last 1 Encounters:  11/28/23 5\' 3"  (1.6 m)    Weight:  Wt Readings from Last 1 Encounters:  12/14/23 71.3 kg    Ideal Body Weight:  52.3 kg  BMI:  Body mass index is 27.84 kg/m.  Estimated Nutritional Needs:  Kcal:  1600-1800 kcal/d Protein:  80-100 g/d Fluid:  1L + UOP   Gabriel Rainwater RD, LDN, CNSC Contact via secure chat. If unavailable, use group chat "RD Inpatient."

## 2023-12-14 NOTE — Progress Notes (Signed)
 [facility] Progress Note   Subjective:    Pt seen in ICU Going for HD upstairs this am Pt not verbalizing  Objective Vitals:   12/14/23 0600 12/14/23 0700 12/14/23 0800 12/14/23 0825  BP: (!) 137/48 (!) 152/52 (!) 156/65 (!) 155/55  Pulse: 74 76 79 79  Resp: (!) 26 (!) 24 (!) 31 (!) 34  Temp:    98 F (36.7 C)  TempSrc:      SpO2: 93% 97% 93% 97%  Weight:      Height:       Physical Exam General: Chronically ill-appearing, awakens to voice, not speaking Gripped my R hand Heart: S1 and S2; No murmurs, gallops, or rubs Lungs: rhonchi bilat are less dramatic today Abdomen: Soft and non-tender Extremities:No LE edema Dialysis Access: R femoral TDC   Dialysis Orders: MWF NW  4h   B350    75.5kg   2/2 bath   R fem TDC  Heparin 2600 + 1400 midrun - last HD 1/22, getting to dry wt - venofer 100 qhd thru 1/31 - mircera 150 q2, last 1/17, due 1/31  Renal-related home meds: - rocaltrol 0.5 mwf - sensipar 30 every day - hydralazine 100 tid - losartan 50 bid - renvela 800mg  ac tid - others: brilinta, crestor, keppra, ritalin, neurontin, asa, PPI, dulera, insulin, albuterol, elavil  CXR 2/15 - no change, small R effusion, no edema/ congestion / infiltrates   Assessment/Plan: AHRF/ +COVID +RSV pna/ volume^: SP course of abx + HD. Extubated 2/09.  CVA: R then L cerebellar infarcts d/t small vessel disease. Per neurology.  On  DAPT AMS: multifactorial. Very slow improvement.  Aspiration pneumonitis - CXR clear 2/15, pt was moved back to ICU. ESRD: on HD MWF. Keep TTS off schedule this week. HD today.   HTN: BPs higher now and started back on hydralazine at 50mg  tid.  Volume: BP's labile 95- 135/ 48- 75. CXR clear on 2/15. UF 0- 1 L w/ HD Anemia of esrd: Hb is down in low 8s now. Will Crista Curb to 200 mcg SQ qFri. Tsat 26% and ferritin 1638.  Started on po iron. Transfuse as needed Secondary hyperparathyroidism - phos in range. CCa is high, not on vdra.  Resume  binders when taking po H/o seizure d/o - on Keppra PEA arrest  Vinson Moselle  MD  [facility] 12/14/2023, 8:37 AM  Recent Labs  Lab 12/08/23 0352 12/09/23 0538 12/11/23 1620 12/13/23 0758 12/14/23 0326  HGB 9.6* 9.2*   < > 8.1* 8.4*  ALBUMIN 2.1* 2.1*  --   --   --   CALCIUM 9.1 8.9   < > 8.8* 9.4  PHOS 2.3* 2.0*  --   --   --   CREATININE 3.75* 2.76*   < > 4.12* 5.10*  K 4.5 4.4   < > 4.7 5.3*   < > = values in this interval not displayed.    Inpatient medications:  amiodarone  200 mg Per Tube BID   amoxicillin-clavulanate  1 tablet Per Tube Q12H   arformoterol  15 mcg Nebulization BID   aspirin  81 mg Per Tube Daily   budesonide (PULMICORT) nebulizer solution  0.5 mg Nebulization BID   Chlorhexidine Gluconate Cloth  6 each Topical Q0600   Chlorhexidine Gluconate Cloth  6 each Topical Q0600   darbepoetin (ARANESP) injection - DIALYSIS  150 mcg Subcutaneous Q Mon-1800   ferrous sulfate  300 mg Per Tube Daily   heparin injection (subcutaneous)  5,000 Units  Subcutaneous Q8H   hydrALAZINE  50 mg Per Tube Q8H   insulin aspart  0-15 Units Subcutaneous Q4H   insulin aspart  4 Units Subcutaneous Q4H   insulin glargine-yfgn  5 Units Subcutaneous Daily   levETIRAcetam  500 mg Per Tube BID   multivitamin  1 tablet Per Tube QHS   nutrition supplement (JUVEN)  1 packet Per Tube BID BM   mouth rinse  15 mL Mouth Rinse Q4H   rosuvastatin  10 mg Per Tube Daily   sodium chloride flush  3 mL Intravenous Q12H   ticagrelor  90 mg Per Tube BID    anticoagulant sodium citrate     feeding supplement (VITAL 1.5 CAL) Stopped (12/14/23 0813)   acetaminophen **OR** acetaminophen, alteplase, anticoagulant sodium citrate, docusate, feeding supplement (NEPRO CARB STEADY), heparin, ipratropium-albuterol, labetalol, lidocaine (PF), lidocaine-prilocaine, ondansetron (ZOFRAN) IV, mouth rinse, pentafluoroprop-tetrafluoroeth, polyethylene glycol, Racepinephrine HCl, white petrolatum, witch  hazel-glycerin

## 2023-12-14 NOTE — Progress Notes (Signed)
  Daily Progress Note   Patient Name: Carolyn Russo       Date: 12/14/2023 DOB: August 30, 1951  Age: 73 y.o. MRN#: [id number] Attending Physician: Carolyn Glass, MD Primary Care Physician: Carolyn Brod, MD Admit Date: 11/16/2023 Length of Stay: 28 days  Reason for Consultation/Follow-up: Establishing goals of care  HPI/Patient Profile:  2F with ESRD on HD, CHF, prior CVA, DMII, right BKA and seizure disorder who is admitted with acute hypoxemic respiratory failure due to Covid and RSV pneumonia, c/b acute right PICA infarct and PEA arrest. Extubated 2/9 and stable on [place], has lots of secretions. Concern for high risk of deterioration. Family states would want full scope of care, full code. Husband is a physician and has support from their son. They want to engage with palliative team for support.   Subjective:   Subjective: Chart Reviewed. Updates received. Patient Assessed. Created space and opportunity for patient  and family to explore thoughts and feelings regarding current medical situation.  Today's Discussion: Today saw the patient at bedside, she is resting/sleeping peacefully I elected to not wake her.  Her respirations are even and unlabored.  Vital signs are stable.  Per PCCM the patient has been essentially stable/slowly improving.  I reviewed previous notes and goals are clear are very clear.  The patient's husband is an ophthalmologist and strongly advocating for full code and full scope.  The patient currently has a core track and and will likely need a PEG tube which is within her goals of care.  The patient transitioned out of ICU to [facility] today.  Goals are clear and palliative medicine will back off.  Review of Systems  Unable to perform ROS   Objective:   Vital Signs:  BP (!) 126/46   Pulse 80   Temp 98.8 F (37.1 C)   Resp (!) 24   Ht 5\' 3"  (1.6 m)   Wt 71.3 kg   SpO2 100%   BMI 27.84 kg/m   Physical Exam Vitals and nursing note reviewed.  Constitutional:       General: She is sleeping. She is not in acute distress. HENT:     Head: Normocephalic and atraumatic.  Cardiovascular:     Rate and Rhythm: Normal rate.  Pulmonary:     Effort: Pulmonary effort is normal. No respiratory distress.  Skin:    General: Skin is warm and dry.     Palliative Assessment/Data: 10% (CoreTrak in place)    Existing Vynca/ACP Documentation: None  Assessment & Plan:   Impression: Present on Admission:  Pneumonia  SUMMARY OF RECOMMENDATIONS   Full code Full scope of care Anticipate likely need for PEG tube for long-term nutrition Goals are clear Palliative medicine will back off for now Please notify us of any significant clinical change or new palliative needs requiring or reinvolvement  Symptom Management:  Per primary team PMT is available to assist as needed  Code Status: Full code  Prognosis: Unable to determine  Discharge Planning: To Be Determined  Discussed with: Medical team, nursing team  Thank you for allowing [place] to participate in the care of Carolyn Russo PMT will continue to support holistically.  Time Total: 29 min  Detailed review of medical records (labs, imaging, vital signs), medically appropriate exam, discussed with treatment team, counseling and education to patient, family, & staff, documenting clinical information, medication management, coordination of care  Carolyn Dust, NP Palliative Medicine Team  Team Phone # [phone] (Nights/Weekends)  06/22/2021, 8:17 AM

## 2023-12-14 NOTE — Progress Notes (Signed)
 SLP Cancellation Note  Patient Details Name: Carolyn Russo MRN: [id number] DOB: 1951/06/26   Cancelled treatment:        Pt in HD. Will continue efforts as able    Royce Macadamia 12/14/2023, 9:05 AM

## 2023-12-14 NOTE — Plan of Care (Signed)
  Problem: Fluid Volume: Goal: Ability to maintain a balanced intake and output will improve Outcome: Progressing   Problem: Health Behavior/Discharge Planning: Goal: Ability to manage health-related needs will improve Outcome: Not Progressing   Problem: Metabolic: Goal: Ability to maintain appropriate glucose levels will improve Outcome: Not Progressing

## 2023-12-15 DIAGNOSIS — G9340 Encephalopathy, unspecified: Secondary | ICD-10-CM | POA: Diagnosis not present

## 2023-12-15 LAB — GLUCOSE, CAPILLARY
Glucose-Capillary: 189 mg/dL — ABNORMAL HIGH (ref 70–99)
Glucose-Capillary: 130 mg/dL — ABNORMAL HIGH (ref 70–99)
Glucose-Capillary: 158 mg/dL — ABNORMAL HIGH (ref 70–99)
Glucose-Capillary: 201 mg/dL — ABNORMAL HIGH (ref 70–99)
Glucose-Capillary: 229 mg/dL — ABNORMAL HIGH (ref 70–99)

## 2023-12-15 LAB — CBC WITH DIFFERENTIAL/PLATELET
Abs Immature Granulocytes: 0.38 10*3/uL — ABNORMAL HIGH (ref 0.00–0.07)
Basophils Absolute: 0.1 10*3/uL (ref 0.0–0.1)
Basophils Relative: 0 %
Eosinophils Absolute: 0.3 10*3/uL (ref 0.0–0.5)
Eosinophils Relative: 1 %
HCT: 25.1 % — ABNORMAL LOW (ref 36.0–46.0)
Hemoglobin: 8.5 g/dL — ABNORMAL LOW (ref 12.0–15.0)
Immature Granulocytes: 2 %
Lymphocytes Relative: 13 %
Lymphs Abs: 3 10*3/uL (ref 0.7–4.0)
MCH: 30.9 pg (ref 26.0–34.0)
MCHC: 33.9 g/dL (ref 30.0–36.0)
MCV: 91.3 fL (ref 80.0–100.0)
Monocytes Absolute: 1.9 10*3/uL — ABNORMAL HIGH (ref 0.1–1.0)
Monocytes Relative: 8 %
Neutro Abs: 17.3 10*3/uL — ABNORMAL HIGH (ref 1.7–7.7)
Neutrophils Relative %: 76 %
Platelets: 523 10*3/uL — ABNORMAL HIGH (ref 150–400)
RBC: 2.75 MIL/uL — ABNORMAL LOW (ref 3.87–5.11)
RDW: 17.7 % — ABNORMAL HIGH (ref 11.5–15.5)
WBC: 22.9 10*3/uL — ABNORMAL HIGH (ref 4.0–10.5)
nRBC: 0.4 % — ABNORMAL HIGH (ref 0.0–0.2)

## 2023-12-15 LAB — BASIC METABOLIC PANEL
Anion gap: 12 (ref 5–15)
BUN: 54 mg/dL — ABNORMAL HIGH (ref 8–23)
CO2: 28 mmol/L (ref 22–32)
Calcium: 9.3 mg/dL (ref 8.9–10.3)
Chloride: 96 mmol/L — ABNORMAL LOW (ref 98–111)
Creatinine, Ser: 3.26 mg/dL — ABNORMAL HIGH (ref 0.44–1.00)
GFR, Estimated: 14 mL/min — ABNORMAL LOW (ref >60)
Glucose, Bld: 186 mg/dL — ABNORMAL HIGH (ref 70–99)
Potassium: 4.8 mmol/L (ref 3.5–5.1)
Sodium: 136 mmol/L (ref 135–145)

## 2023-12-15 LAB — CALCIUM, IONIZED: Calcium, Ionized, Serum: 5.2 mg/dL (ref 4.5–5.6)

## 2023-12-15 MED ORDER — CHLORHEXIDINE GLUCONATE CLOTH 2 % EX PADS
6.0000 | MEDICATED_PAD | Freq: Every day | CUTANEOUS | Status: DC
  Administered 2023-12-16 – 2023-12-17 (×2): 6 via TOPICAL

## 2023-12-15 NOTE — TOC Progression Note (Signed)
 Transition of Care [facility]) - Progression Note    Patient Details  Name: Carolyn Russo MRN: [id number] Date of Birth: [birth year]  [facility] of [facility]) CM/SW Contact  Mearl Latin, LCSW Phone Number: 12/15/2023, 2:57 PM  Clinical Narrative:    Patient transferred to 5W. CSW provided update to [facility]. Patient still with cortrak.    Expected Discharge Plan: Skilled Nursing Facility Barriers to Discharge: Continued Medical Work up, English as a second language teacher  Expected Discharge Plan and Services In-house Referral: Clinical Social Work   Post Acute Care Choice: Skilled Nursing Facility Living arrangements for the past 2 months: Single Family Home, Skilled Nursing Facility                                       Social Determinants of Health (SDOH) Interventions SDOH Screenings   Food Insecurity: No Food Insecurity (11/17/2023)  Housing: Low Risk  (11/17/2023)  Transportation Needs: No Transportation Needs (11/17/2023)  Utilities: Not At Risk (11/17/2023)  Alcohol Screen: Low Risk  (01/09/2019)  Depression (PHQ2-9): Low Risk  (09/11/2023)  Financial Resource Strain: Low Risk  (09/02/2021)  Physical Activity: Inactive (09/02/2021)  Social Connections: Socially Integrated (11/17/2023)  Stress: Stress Concern Present (09/02/2021)  Tobacco Use: Low Risk  (11/16/2023)    Readmission Risk Interventions    12/15/2023    2:57 PM 02/14/2023    4:54 PM  Readmission Risk Prevention Plan  Transportation Screening Complete Complete  PCP or Specialist Appt within 3-5 Days  Complete  HRI or Home Care Consult  Complete  Social Work Consult for Recovery Care Planning/Counseling  Complete  Palliative Care Screening  Complete  Medication Review [employer]) Complete Complete  PCP or Specialist appointment within 3-5 days of discharge Complete   HRI or Home Care Consult Complete   SW Recovery Care/Counseling Consult Complete   Palliative Care Screening Complete   Skilled  Nursing Facility Complete

## 2023-12-15 NOTE — Progress Notes (Addendum)
 Speech Language Pathology Treatment: Dysphagia  Patient Details Name: Carolyn Russo MRN: [id number] DOB: May 25, 1951 Today's Date: 12/15/2023 Time: 4098-1191 SLP Time Calculation (min) (ACUTE ONLY): 11 min  Assessment / Plan / Recommendation Clinical Impression  Session was limited by lethargy this date with very brief periods of arousal. SLP provided thorough oral care. Pt made no active attempts at initiating oral transit with one ice chip, which was eventually removed with suction. She is not adequately alert for further PO trials. Recommend her NPO status be maintained with frequent and thorough oral care. SLP will continue following.    HPI HPI: Pt is a 73 y.o. female with who presented from SNF on 11/16/23 for chest pain, weakness and confusion. CT head revealed infarct in inferior right cerebellum which was new since 06/12/22. MRI brain with acute right inferomedial cerebellar ischemic infarction in the right PICA territory. Pt also diagnosed with COVID/RSV. On 2/15  transferred back to ICU for atrial fibrillation with rapid ventricular response that developed after a gross aspiration event from vomiting. MBS 1/28 DIGEST swallow severity rating of 1, mild. Recommended dys2/thin liquids. Secondary swallows needed to clear pharyngeal residue. Experienced cardiac arrest on 1/30 and was intubated until 2/9 (ten days) and remained NPO thereafter with cortrak. PMH: CVA in 2022 and 2023, right BKA, HTN, DM-2, HLD, ESRD on HD MWF, asthma, dysphagia, CHF, heart murmur, seizure.      SLP Plan  Continue with current plan of care      Recommendations for follow up therapy are one component of a multi-disciplinary discharge planning process, led by the attending physician.  Recommendations may be updated based on patient status, additional functional criteria and insurance authorization.    Recommendations  Diet recommendations: NPO Medication Administration: Via alternative means                   Oral care QID   Frequent or constant Supervision/Assistance Dysphagia, oropharyngeal phase (R13.12)     Continue with current plan of care     Gwynneth Aliment, M.A., CF-SLP Speech Language Pathology, Acute Rehabilitation Services  Secure Chat preferred [phone]   12/15/2023, 12:58 PM

## 2023-12-15 NOTE — Progress Notes (Signed)
 PROGRESS NOTE  Carolyn Russo  DOB: 08-09-1951  PCP: Annita Brod, MD WUJ:[id number]  DOA: 11/16/2023  LOS: 29 days  Hospital Day: 30  Brief narrative: Carolyn Russo is a 73 y.o. female with PMH significant for ESRD-HD-MWF, DM2, HTN, HLD, CHF, CVA, dysphagia, GERD, migraine 1/23, patient presented to ED with confusion, left-sided chest pain, shortness of breath, coughing.  She was found to be hypoxic. CT chest rule out PE but showed bibasilar consolidations Resp virus panel positive for COVID and RSV She was also suspected of having secondary bacterial infection and was started on IV antibiotics. CT head on admission also showed a right cerebellar infarct. Admitted to [facility]  1/30, she had a PEA arrest while in dialysis.  ROSC was achieved in few minutes.  Intubated to protect airway and transferred to ICU.  Needed Precedex drip as well 2/5, had new and Acute central left Cerebellar Infarct with no associated hemorrhage or mass effect. 2/9 extubated, off precedex  2/15, transferred to [facility] but suffered a gross aspiration event leading to A-fib with RVR and hemodynamic instability.  Transferred back to ICU.  Rate controlled since blood dysphagia persists.  Tube feeding started 2/20, transferred out to [facility].    Palliative care was consulted.  Patient's husband is a retired Clinical research associate and wants to continue aggressive care.  Subjective: Patient was seen and examined this morning in ICU. Somnolent.  Opens eyes on command.  Per RN, she was able to tell her name earlier.  Not to me. Getting transferred to progressive unit. IR consulted this morning for PEG tube placement Palliative care signed off.  Assessment and plan: Acute hypoxemic respiratory failure Pneumonia due to COVID and RSV Aspiration pneumonia Timeline of events as above. Was intubated in ICU.  Currently on 2 L oxygen by nasal cannula. Chest PT to continue  Acute right PICA infarct on 1/24 New acute central left  cerebellar infarct - 2/5 Stroke workup completed.   Per last neurology recommendation on 2/7, continue DAPT with aspirin 81 mg daily and Brilinta 90 mg twice daily. Continue statin Needs 30-day heart monitor at discharge  Dysphagia Secondary to stroke Currently on Cortrak feeding. Speech therapy following. No history of diabetes. A1c 5.8 on 10/26/2023 Continue SSI with Accu-Cheks while on tube feeding. IR has been consulted for PEG tube placement. Recent Labs  Lab 12/14/23 1934 12/14/23 2342 12/15/23 0342 12/15/23 0755 12/15/23 1242  GLUCAP 208* 220* 189* 130* 158*   PEA arrest -1/30 Secondary to hypotension while in dialysis.  Atrial Fibrillation with RVR -2/5 Secondary to a big aspiration event. Seen by cardiology.  Started on amiodarone Currently in normal sinus rhythm  Acute encephalopathy Multifactorial: anoxic, ischemic, toxometabolic (renal failure, medications).  Concern for cefepime toxicity given her hx of ESRD, med was discontinued 2/4. Stopped Seroquel and amitriptyline Limit centrally acting medications as possible   ESRD on HD MWF Hyperkalemia Nephrology following.   Potassium level improved after dialysis . Recent Labs  Lab 12/09/23 0538 12/11/23 1620 12/12/23 0531 12/13/23 0758 12/13/23 0816 12/14/23 0326 12/15/23 0359  K 4.4 5.2* 5.0 4.7  --  5.3* 4.8  MG 2.0 2.2  --   --  2.2  --   --   PHOS 2.0*  --   --   --   --   --   --     Hypertension Hypotensive initially, but now normotensive Continue Home hydralazine at reduced dose, monitor    Anemia of chronic disease 1 unit blood on  2/10, Hgb stable Hemoglobin currently stable between 8 and 9. Aranesp every Friday  Recent Labs    12/01/23 1130 12/02/23 0516 12/09/23 0538 12/12/23 0531 12/13/23 0758 12/14/23 0326 12/15/23 0359  HGB  --    < > 9.2* 8.6* 8.1* 8.4* 8.5*  MCV  --    < > 89.3 88.1 89.8 90.5 91.3  FERRITIN 1,638*  --   --   --   --   --   --   TIBC 108*  --   --   --   --    --   --   IRON 28  --   --   --   --   --   --    < > = values in this interval not displayed.   Sinusitis  MRI brain noted progressed mastoid effusions On Augmentin BID x 10 days (renal dosed).  Change IV to NG formulation (2/16).  Leukocytosis WBC count running elevated.  No fever.  Completed the last course of antibiotics with Augmentin today 2/21. Continue to monitor WBC trend. Recent Labs  Lab 12/09/23 0538 12/12/23 0531 12/13/23 0758 12/14/23 0326 12/15/23 0359  WBC 15.2* 24.8* 21.1* 24.7* 22.9*   Abnormal C6 signal -MRI showed a nonspecific bone marrow signal of c6 vertebral body.  Unclear if related to a malignancy vs discitis/osteomyelitis.  Will hold off on C spine imaging, as family does not want to pursue this workup and priority is mental status/neuro recovery.  If no other findings, consider C spine MRI to further evaluate   Seizure disorder -Continue home Keppra 500 mg BID    Mobility: PT eval may help after mental status improvement.  Goals of care   Code Status: Full Code  Palliative care following.   DVT prophylaxis:  SCDs Start: 11/23/23 0716 heparin injection 5,000 Units Start: 11/17/23 0930   Antimicrobials: Completed the course Fluid: None Consultants: Palliative care, nephrology Family Communication: None at bedside  Status: Inpatient Level of care:  Progressive   Patient is from: Home Needs to continue in-hospital care: Mental status not improving  Anticipated d/c to: Pending clinical course    Diet:  Diet Order             Diet NPO time specified  Diet effective now                   Scheduled Meds:  amiodarone  200 mg Per Tube BID   amoxicillin-clavulanate  1 tablet Per Tube Q12H   arformoterol  15 mcg Nebulization BID   aspirin  81 mg Per Tube Daily   budesonide (PULMICORT) nebulizer solution  0.5 mg Nebulization BID   Chlorhexidine Gluconate Cloth  6 each Topical Q0600   darbepoetin (ARANESP) injection - DIALYSIS   150 mcg Subcutaneous Q Mon-1800   ferrous sulfate  300 mg Per Tube Daily   fiber supplement (BANATROL TF)  60 mL Per Tube BID   heparin injection (subcutaneous)  5,000 Units Subcutaneous Q8H   hydrALAZINE  50 mg Per Tube Q8H   insulin aspart  0-15 Units Subcutaneous Q4H   insulin aspart  4 Units Subcutaneous Q4H   insulin glargine-yfgn  5 Units Subcutaneous Daily   levETIRAcetam  500 mg Per Tube BID   multivitamin  1 tablet Per Tube QHS   nutrition supplement (JUVEN)  1 packet Per Tube BID BM   mouth rinse  15 mL Mouth Rinse Q4H   rosuvastatin  10 mg Per Tube Daily  sodium chloride flush  3 mL Intravenous Q12H   ticagrelor  90 mg Per Tube BID    PRN meds: acetaminophen **OR** acetaminophen, docusate, ipratropium-albuterol, labetalol, ondansetron (ZOFRAN) IV, mouth rinse, polyethylene glycol, Racepinephrine HCl, white petrolatum, witch hazel-glycerin   Infusions:   feeding supplement (VITAL 1.5 CAL) 45 mL/hr at 12/15/23 0400    Antimicrobials: Anti-infectives (From admission, onward)    Start     Dose/Rate Route Frequency Ordered Stop   12/11/23 1000  amoxicillin-clavulanate (AUGMENTIN) 500-125 MG per tablet 1 tablet        1 tablet Per Tube Every 12 hours 12/10/23 2122 12/15/23 2359   12/10/23 1000  amoxicillin-clavulanate (AUGMENTIN) 500-125 MG per tablet 1 tablet  Status:  Discontinued        1 tablet Oral Every 12 hours 12/10/23 0850 12/10/23 2122   12/09/23 2200  Ampicillin-Sulbactam (UNASYN) 3 g in sodium chloride 0.9 % 100 mL IVPB  Status:  Discontinued        3 g 200 mL/hr over 30 Minutes Intravenous Every 12 hours 12/09/23 1136 12/10/23 0850   12/06/23 1130  amoxicillin-clavulanate (AUGMENTIN) 400-57 MG/5ML suspension 500 mg  Status:  Discontinued        500 mg Per Tube Every 12 hours 12/06/23 1031 12/09/23 1136   12/06/23 1115  amoxicillin-clavulanate (AUGMENTIN) 250-62.5 MG/5ML suspension 500 mg  Status:  Discontinued        500 mg Oral Every 12 hours 12/06/23 1015  12/06/23 1018   12/06/23 1115  amoxicillin-clavulanate (AUGMENTIN) 250-62.5 MG/5ML suspension 500 mg  Status:  Discontinued        500 mg Per Tube Every 12 hours 12/06/23 1018 12/06/23 1031   11/30/23 1800  ceFEPIme (MAXIPIME) 1 g in sodium chloride 0.9 % 100 mL IVPB  Status:  Discontinued        1 g 200 mL/hr over 30 Minutes Intravenous Every 24 hours 11/28/23 0933 11/28/23 1010   11/27/23 1200  vancomycin (VANCOREADY) IVPB 750 mg/150 mL  Status:  Discontinued        750 mg 150 mL/hr over 60 Minutes Intravenous Every M-W-F (Hemodialysis) 11/26/23 1250 11/27/23 0456   11/27/23 1200  vancomycin (VANCOREADY) IVPB 500 mg/100 mL  Status:  Discontinued        500 mg 100 mL/hr over 60 Minutes Intravenous Every M-W-F (Hemodialysis) 11/27/23 0456 11/27/23 0457   11/27/23 1200  vancomycin (VANCOCIN) 750 mg in sodium chloride 0.9 % 250 mL IVPB  Status:  Discontinued        750 mg 250 mL/hr over 60 Minutes Intravenous Every M-W-F (Hemodialysis) 11/27/23 0457 11/27/23 0853   11/25/23 1800  vancomycin (VANCOREADY) IVPB 750 mg/150 mL  Status:  Discontinued        750 mg 150 mL/hr over 60 Minutes Intravenous  Once 11/24/23 1015 11/25/23 1401   11/25/23 1800  vancomycin (VANCOCIN) 750 mg in sodium chloride 0.9 % 250 mL IVPB        750 mg 250 mL/hr over 60 Minutes Intravenous  Once 11/25/23 1401 11/26/23 0510   11/24/23 2000  ceFEPIme (MAXIPIME) 2 g in sodium chloride 0.9 % 100 mL IVPB  Status:  Discontinued        2 g 200 mL/hr over 30 Minutes Intravenous Every M-W-F (2000) 11/23/23 1058 11/24/23 1015   11/24/23 1800  ceFEPIme (MAXIPIME) 1 g in sodium chloride 0.9 % 100 mL IVPB  Status:  Discontinued        1 g 200 mL/hr over 30 Minutes Intravenous  Every 24 hours 11/24/23 1015 11/28/23 0933   11/24/23 1200  vancomycin (VANCOREADY) IVPB 750 mg/150 mL  Status:  Discontinued        750 mg 150 mL/hr over 60 Minutes Intravenous Every M-W-F (Hemodialysis) 11/23/23 1451 11/24/23 0453   11/24/23 1200   vancomycin (VANCOCIN) 750 mg in sodium chloride 0.9 % 250 mL IVPB  Status:  Discontinued        750 mg 250 mL/hr over 60 Minutes Intravenous Every M-W-F (Hemodialysis) 11/24/23 0456 11/24/23 1015   11/24/23 1015  vancomycin variable dose per unstable renal function (pharmacist dosing)  Status:  Discontinued         Does not apply See admin instructions 11/24/23 1015 11/27/23 0853   11/24/23 0453  vancomycin (VANCOREADY) IVPB 750 mg/150 mL  Status:  Discontinued        750 mg 150 mL/hr over 60 Minutes Intravenous Every Dialysis 11/24/23 0453 11/24/23 0456   11/23/23 1130  vancomycin (VANCOREADY) IVPB 1750 mg/350 mL        1,750 mg 175 mL/hr over 120 Minutes Intravenous  Once 11/23/23 1035 11/23/23 1908   11/23/23 1130  ceFEPIme (MAXIPIME) 2 g in sodium chloride 0.9 % 100 mL IVPB        2 g 200 mL/hr over 30 Minutes Intravenous  Once 11/23/23 1035 11/23/23 1900   11/23/23 1028  vancomycin variable dose per unstable renal function (pharmacist dosing)  Status:  Discontinued         Does not apply See admin instructions 11/23/23 1028 11/23/23 1451   11/16/23 2300  azithromycin (ZITHROMAX) 500 mg in sodium chloride 0.9 % 250 mL IVPB        500 mg 250 mL/hr over 60 Minutes Intravenous Every 24 hours 11/16/23 2112 11/18/23 2300   11/16/23 2200  cefTRIAXone (ROCEPHIN) 2 g in sodium chloride 0.9 % 100 mL IVPB  Status:  Discontinued        2 g 200 mL/hr over 30 Minutes Intravenous Every 24 hours 11/16/23 2112 11/22/23 1032       Objective: Vitals:   12/15/23 0758 12/15/23 0800  BP:  (!) 148/38  Pulse:  86  Resp:  (!) 22  Temp: 99.3 F (37.4 C)   SpO2:  100%    Intake/Output Summary (Last 24 hours) at 12/15/2023 1314 Last data filed at 12/15/2023 0400 Gross per 24 hour  Intake 858 ml  Output 200 ml  Net 658 ml   Filed Weights   12/14/23 0332 12/14/23 1201 12/15/23 0500  Weight: 72.4 kg 71.3 kg 69.3 kg   Weight change: -1.1 kg Body mass index is 27.06 kg/m.   Physical  Exam: General exam: Elderly African-American female.  Not in physical distress. Skin: No rashes, lesions or ulcers. HEENT: Atraumatic, normocephalic, no obvious bleeding.  Cortrak feeding ongoing Lungs: Clear to auscultation bilaterally, diminished air entry in both bases CVS: S1, S2, no murmur,   GI/Abd: Soft, nontender, nondistended, bowel sound present,   CNS: Able to open eyes on command.  Unable to follow any command to me. Psychiatry: Sad affect Extremities: Seems to have chronic bilateral nonpitting edema, no calf tenderness,   Data Review: I have personally reviewed the laboratory data and studies available.  F/u labs  Unresulted Labs (From admission, onward)     Start     Ordered   12/16/23 0500  CBC with Differential/Platelet  Daily,   R     Question:  Specimen collection method  Answer:  Lab=Lab collect  12/15/23 1313   12/16/23 0500  Basic metabolic panel  Daily,   R     Question:  Specimen collection method  Answer:  Lab=Lab collect   12/15/23 1313            Total time spent in review of labs and imaging, patient evaluation, formulation of plan, documentation and communication with family: 45 minutes  Signed, Lorin Glass, MD [facility] 12/15/2023

## 2023-12-15 NOTE — Plan of Care (Signed)
 IR was requested for image guided G tube placement.   Case was reviewed by Dr. Deanne Coffer, anatomy amenable for percutaneous G tube placement. Patient is on DAPT with ASA 81 mg and Brilinta 90 mg, both requires 5 day hold.   Asked attending provider to start holding tomorrow. Patient with fluctuating leukocytosis, currently being treated for PNA due to COVID and RSV. 2 Blood cx from 1/30 showed no growth. INR 1.1 on 11/17/23.    Plan - Plan to proceed on Thursday at the earliest  - Will watch CBC/VS - Formal consult to follow.   Please call IR for questions and concerns.   Lynann Bologna Ivonna Kinnick PA-C 12/15/2023 4:21 PM

## 2023-12-15 NOTE — Plan of Care (Signed)
  Problem: Fluid Volume: Goal: Ability to maintain a balanced intake and output will improve Outcome: Progressing   Problem: Health Behavior/Discharge Planning: Goal: Ability to identify and utilize available resources and services will improve Outcome: Progressing Goal: Ability to manage health-related needs will improve Outcome: Progressing   Problem: Metabolic: Goal: Ability to maintain appropriate glucose levels will improve Outcome: Progressing   Problem: Nutritional: Goal: Maintenance of adequate nutrition will improve Outcome: Progressing Goal: Progress toward achieving an optimal weight will improve Outcome: Progressing   Problem: Education: Goal: Knowledge of secondary prevention will improve (MUST DOCUMENT ALL) Outcome: Progressing Goal: Knowledge of patient specific risk factors will improve (DELETE if not current risk factor) Outcome: Progressing

## 2023-12-15 NOTE — Progress Notes (Signed)
 Physical Therapy Treatment Patient Details Name: ROBBY PIRANI MRN: [id number] DOB: [birth year] Today's Date: 12/15/2023   History of Present Illness Pt is a 73 yo female presenting to [facility] on 11/16/23 for chest pain, weakness, and confusion. CT head revealed infarct in the inferior right cerebellum. MRI brain with acute right inferomedial cerebellar ischemic infarction in the right PICA territory. Pt also diagnosed with Covid/RSV. PEA arrest 1/30. New L cerebellar infarct found on MRI 2/5. Extubated 2/9. PMH: R BKA on 7/26, DM2, HTN, HLD, ESRD on HD MWF, hx of CVA 03/2021 & 05/2022, asthma, CHF, dysphagia, heart murmur, seizure    PT Comments  Pt tolerates treatment well, initially lethargic but becoming more alert with initiation of mobility. Pt requires significant assistance for all functional mobility. PT continues to note no purposeful movement of L side. PT assisted pt in transfer out of bed in an effort to show tolerance for sitting in outpatient dialysis. Nursing staff instructed in use of hoyer lift for return to bed. Patient will benefit from continued inpatient follow up therapy, <3 hours/day.    If plan is discharge home, recommend the following: Two people to help with walking and/or transfers;Two people to help with bathing/dressing/bathroom;Assistance with cooking/housework;Direct supervision/assist for medications management;Help with stairs or ramp for entrance   Can travel by private vehicle     No  Equipment Recommendations   (defer to post-acute venue of care)    Recommendations for Other Services       Precautions / Restrictions Precautions Precautions: Fall Recall of Precautions/Restrictions: Impaired Precaution/Restrictions Comments: Monitor O2/RR, cortrak, L hemi Required Braces or Orthoses: Other Brace Other Brace: RLE prosthetic Restrictions Weight Bearing Restrictions Per Provider Order: No     Mobility  Bed Mobility Overal bed mobility: Needs Assistance Bed  Mobility: Supine to Sit     Supine to sit: Total assist, HOB elevated          Transfers Overall transfer level: Needs assistance Equipment used: 1 person hand held assist Transfers: Bed to chair/wheelchair/BSC       Squat pivot transfers: Total assist     General transfer comment: PT provides LLE knee block and support at gait belt. pt does follow cues for anterior trunk lean and for RUE placement. PT notes no activation of LLE during transfer    Ambulation/Gait                   Stairs             Wheelchair Mobility     Tilt Bed    Modified Rankin (Stroke Patients Only) Modified Rankin (Stroke Patients Only) Pre-Morbid Rankin Score: Moderately severe disability Modified Rankin: Severe disability     Balance Overall balance assessment: Needs assistance Sitting-balance support: Single extremity supported, Feet supported Sitting balance-Leahy Scale: Poor Sitting balance - Comments: mod-maxA   Standing balance support: Single extremity supported Standing balance-Leahy Scale: Zero Standing balance comment: totalA                            Communication Communication Communication: Impaired Factors Affecting Communication: Difficulty expressing self  Cognition Arousal: Lethargic, Alert (lethargic initially but becomes alert with bed mobility) Behavior During Therapy: Flat affect   PT - Cognitive impairments: No family/caregiver present to determine baseline, Difficult to assess Difficult to assess due to: Impaired communication  PT - Cognition Comments: pt inconsistently follows commands with R side, requries increased time for processing and response Following commands: Impaired Following commands impaired: Follows one step commands inconsistently, Follows one step commands with increased time    Cueing Cueing Techniques: Verbal cues, Gestural cues, Tactile cues, Visual cues  Exercises       General Comments General comments (skin integrity, edema, etc.): VSS on 2L [place]      Pertinent Vitals/Pain Pain Assessment Pain Assessment: PAINAD Breathing: normal Negative Vocalization: none Facial Expression: smiling or inexpressive Body Language: relaxed Consolability: no need to console PAINAD Score: 0    Home Living                          Prior Function            PT Goals (current goals can now be found in the care plan section) Acute Rehab PT Goals Patient Stated Goal: return to rehab to improve mobility PT Goal Formulation: Patient unable to participate in goal setting Time For Goal Achievement: 12/29/23 Potential to Achieve Goals: Poor Progress towards PT goals: Progressing toward goals    Frequency    Min 1X/week      PT Plan      Co-evaluation              AM-PAC PT "6 Clicks" Mobility   Outcome Measure  Help needed turning from your back to your side while in a flat bed without using bedrails?: Total Help needed moving from lying on your back to sitting on the side of a flat bed without using bedrails?: Total Help needed moving to and from a bed to a chair (including a wheelchair)?: Total Help needed standing up from a chair using your arms (e.g., wheelchair or bedside chair)?: Total Help needed to walk in hospital room?: Total Help needed climbing 3-5 steps with a railing? : Total 6 Click Score: 6    End of Session Equipment Utilized During Treatment: Gait belt;Oxygen Activity Tolerance: Patient tolerated treatment well Patient left: in chair;with call bell/phone within reach;with chair alarm set Nurse Communication: Mobility status;Need for lift equipment (hoyer lift for transfers) PT Visit Diagnosis: Muscle weakness (generalized) (M62.81);Difficulty in walking, not elsewhere classified (R26.2);Other symptoms and signs involving the nervous system (R29.898);Hemiplegia and hemiparesis Hemiplegia - Right/Left: Left Hemiplegia -  caused by: Cerebral infarction     Time: 1610-9604 PT Time Calculation (min) (ACUTE ONLY): 30 min  Charges:    $Therapeutic Activity: 8-22 mins $Neuromuscular Re-education: 8-22 mins PT General Charges $$ ACUTE PT VISIT: 1 Visit                     Arlyss Gandy, PT, DPT Acute Rehabilitation Office [phone]    Arlyss Gandy 12/15/2023, 3:58 PM

## 2023-12-15 NOTE — Progress Notes (Addendum)
 [facility] Progress Note   Subjective:    Pt seen in ICU Had HD upstairs yesterday w/ no BP drops or high HR's, took off 1 L net  Objective Vitals:   12/15/23 0600 12/15/23 0700 12/15/23 0758 12/15/23 0800  BP: (!) 133/47 113/89  (!) 148/38  Pulse: 83 82  86  Resp: (!) 25 (!) 30  (!) 22  Temp:   99.3 F (37.4 C)   TempSrc:   Axillary   SpO2: 100% 98%  100%  Weight:      Height:       Physical Exam General: Chronically ill-appearing, awake, not speaking Grips my hand Heart: S1 and S2; No murmurs, gallops, or rubs Lungs: rhonchi bilat are worse than yesterday Abdomen: Soft and non-tender Extremities:No LE edema Dialysis Access: R femoral TDC   Dialysis Orders: MWF NW  4h   B350    75.5kg   2/2 bath   R fem TDC  Heparin 2600 + 1400 midrun - last HD 1/22, getting to dry wt - venofer 100 qhd thru 1/31 - mircera 150 q2, last 1/17, due 1/31  Renal-related home meds: - rocaltrol 0.5 mwf - sensipar 30 every day - hydralazine 100 tid - losartan 50 bid - renvela 800mg  ac tid - others: brilinta, crestor, keppra, ritalin, neurontin, asa, PPI, dulera, insulin, albuterol, elavil  CXR 2/12, 2/15, 2/18 - no change, small R effusion, no edema/ congestion   Assessment/Plan: AHRF/ +COVID +RSV pna/ volume^: SP course of abx + HD. Extubated 2/09.  CVA: R then L cerebellar infarcts d/t small vessel disease. Per neurology.  On  DAPT AMS: multifactorial. Very slow improvement.  Aspiration pneumonitis - CXR clear 2/15, pt was moved back to ICU. ESRD: on HD MWF. Keep TTS off schedule this week. HD Sat.  HTN: BPs higher now and started back on hydralazine at 50mg  tid.  Volume: BP's labile 95- 135/ 48- 75. CXR clear on 2/15. UF 0- 1 L w/ HD Anemia of esrd: Hb is down in low 8s now. ^'d the darbe to 200 mcg SQ qFri. Tsat 26% and ferritin 1638.  Getting po iron. Transfuse as needed Secondary hyperparathyroidism - phos in range. CCa is high, not on vdra.  Resume binders when  taking po H/o seizure d/o - on Keppra PEA arrest  Vinson Moselle  MD  [facility] 12/15/2023, 1:58 PM  Recent Labs  Lab 12/09/23 0538 12/11/23 1620 12/14/23 0326 12/15/23 0359  HGB 9.2*   < > 8.4* 8.5*  ALBUMIN 2.1*  --   --   --   CALCIUM 8.9   < > 9.4 9.3  PHOS 2.0*  --   --   --   CREATININE 2.76*   < > 5.10* 3.26*  K 4.4   < > 5.3* 4.8   < > = values in this interval not displayed.    Inpatient medications:  amiodarone  200 mg Per Tube BID   amoxicillin-clavulanate  1 tablet Per Tube Q12H   arformoterol  15 mcg Nebulization BID   aspirin  81 mg Per Tube Daily   budesonide (PULMICORT) nebulizer solution  0.5 mg Nebulization BID   Chlorhexidine Gluconate Cloth  6 each Topical Q0600   darbepoetin (ARANESP) injection - DIALYSIS  150 mcg Subcutaneous Q Mon-1800   ferrous sulfate  300 mg Per Tube Daily   fiber supplement (BANATROL TF)  60 mL Per Tube BID   heparin injection (subcutaneous)  5,000 Units Subcutaneous Q8H   hydrALAZINE  50 mg Per Tube Q8H   insulin aspart  0-15 Units Subcutaneous Q4H   insulin aspart  4 Units Subcutaneous Q4H   insulin glargine-yfgn  5 Units Subcutaneous Daily   levETIRAcetam  500 mg Per Tube BID   multivitamin  1 tablet Per Tube QHS   nutrition supplement (JUVEN)  1 packet Per Tube BID BM   mouth rinse  15 mL Mouth Rinse Q4H   rosuvastatin  10 mg Per Tube Daily   sodium chloride flush  3 mL Intravenous Q12H   ticagrelor  90 mg Per Tube BID    feeding supplement (VITAL 1.5 CAL) 45 mL/hr at 12/15/23 0400   acetaminophen **OR** acetaminophen, docusate, ipratropium-albuterol, labetalol, ondansetron (ZOFRAN) IV, mouth rinse, polyethylene glycol, Racepinephrine HCl, white petrolatum, witch hazel-glycerin

## 2023-12-16 ENCOUNTER — Inpatient Hospital Stay (HOSPITAL_COMMUNITY): Payer: Medicare PPO

## 2023-12-16 DIAGNOSIS — G9340 Encephalopathy, unspecified: Secondary | ICD-10-CM | POA: Diagnosis not present

## 2023-12-16 LAB — GLUCOSE, CAPILLARY
Glucose-Capillary: 165 mg/dL — ABNORMAL HIGH (ref 70–99)
Glucose-Capillary: 177 mg/dL — ABNORMAL HIGH (ref 70–99)
Glucose-Capillary: 191 mg/dL — ABNORMAL HIGH (ref 70–99)
Glucose-Capillary: 219 mg/dL — ABNORMAL HIGH (ref 70–99)
Glucose-Capillary: 247 mg/dL — ABNORMAL HIGH (ref 70–99)

## 2023-12-16 LAB — CBC WITH DIFFERENTIAL/PLATELET
Abs Immature Granulocytes: 0.27 10*3/uL — ABNORMAL HIGH (ref 0.00–0.07)
Basophils Absolute: 0.1 10*3/uL (ref 0.0–0.1)
Basophils Relative: 0 %
Eosinophils Absolute: 0.3 10*3/uL (ref 0.0–0.5)
Eosinophils Relative: 1 %
HCT: 25.6 % — ABNORMAL LOW (ref 36.0–46.0)
Hemoglobin: 8.6 g/dL — ABNORMAL LOW (ref 12.0–15.0)
Immature Granulocytes: 1 %
Lymphocytes Relative: 11 %
Lymphs Abs: 2.6 10*3/uL (ref 0.7–4.0)
MCH: 31.2 pg (ref 26.0–34.0)
MCHC: 33.6 g/dL (ref 30.0–36.0)
MCV: 92.8 fL (ref 80.0–100.0)
Monocytes Absolute: 1.6 10*3/uL — ABNORMAL HIGH (ref 0.1–1.0)
Monocytes Relative: 7 %
Neutro Abs: 19.1 10*3/uL — ABNORMAL HIGH (ref 1.7–7.7)
Neutrophils Relative %: 80 %
Platelets: 542 10*3/uL — ABNORMAL HIGH (ref 150–400)
RBC: 2.76 MIL/uL — ABNORMAL LOW (ref 3.87–5.11)
RDW: 18.1 % — ABNORMAL HIGH (ref 11.5–15.5)
WBC: 23.9 10*3/uL — ABNORMAL HIGH (ref 4.0–10.5)
nRBC: 0.3 % — ABNORMAL HIGH (ref 0.0–0.2)

## 2023-12-16 LAB — BASIC METABOLIC PANEL
Anion gap: 11 (ref 5–15)
BUN: 85 mg/dL — ABNORMAL HIGH (ref 8–23)
CO2: 30 mmol/L (ref 22–32)
Calcium: 9.7 mg/dL (ref 8.9–10.3)
Chloride: 96 mmol/L — ABNORMAL LOW (ref 98–111)
Creatinine, Ser: 4.87 mg/dL — ABNORMAL HIGH (ref 0.44–1.00)
GFR, Estimated: 9 mL/min — ABNORMAL LOW (ref >60)
Glucose, Bld: 206 mg/dL — ABNORMAL HIGH (ref 70–99)
Potassium: 5.4 mmol/L — ABNORMAL HIGH (ref 3.5–5.1)
Sodium: 137 mmol/L (ref 135–145)

## 2023-12-16 MED ORDER — ANTICOAGULANT SODIUM CITRATE 4% (200MG/5ML) IV SOLN
5.0000 mL | Freq: Once | Status: AC
  Administered 2023-12-16: 5 mL
  Filled 2023-12-16: qty 5

## 2023-12-16 MED ORDER — VITAL 1.5 CAL PO LIQD
1000.0000 mL | ORAL | Status: DC
  Administered 2023-12-17: 1000 mL
  Filled 2023-12-16 (×3): qty 1000

## 2023-12-16 NOTE — Plan of Care (Signed)
  Problem: Coping: Goal: Ability to adjust to condition or change in health will improve Outcome: Progressing   Problem: Fluid Volume: Goal: Ability to maintain a balanced intake and output will improve Outcome: Progressing   Problem: Health Behavior/Discharge Planning: Goal: Ability to identify and utilize available resources and services will improve Outcome: Progressing Goal: Ability to manage health-related needs will improve Outcome: Progressing   Problem: Metabolic: Goal: Ability to maintain appropriate glucose levels will improve Outcome: Progressing   Problem: Nutritional: Goal: Maintenance of adequate nutrition will improve Outcome: Progressing Goal: Progress toward achieving an optimal weight will improve Outcome: Progressing   Problem: Skin Integrity: Goal: Risk for impaired skin integrity will decrease Outcome: Progressing   Problem: Education: Goal: Knowledge of disease or condition will improve Outcome: Progressing Goal: Knowledge of secondary prevention will improve (MUST DOCUMENT ALL) Outcome: Progressing Goal: Knowledge of patient specific risk factors will improve (DELETE if not current risk factor) Outcome: Progressing   Problem: Ischemic Stroke/TIA Tissue Perfusion: Goal: Complications of ischemic stroke/TIA will be minimized Outcome: Progressing   Problem: Coping: Goal: Will verbalize positive feelings about self Outcome: Progressing Goal: Will identify appropriate support needs Outcome: Progressing   Problem: Health Behavior/Discharge Planning: Goal: Ability to manage health-related needs will improve Outcome: Progressing Goal: Goals will be collaboratively established with patient/family Outcome: Progressing   Problem: Self-Care: Goal: Ability to participate in self-care as condition permits will improve Outcome: Progressing Goal: Verbalization of feelings and concerns over difficulty with self-care will improve Outcome:  Progressing Goal: Ability to communicate needs accurately will improve Outcome: Progressing   Problem: Nutrition: Goal: Dietary intake will improve Outcome: Progressing   Problem: Education: Goal: Knowledge of risk factors and measures for prevention of condition will improve Outcome: Progressing   Problem: Coping: Goal: Psychosocial and spiritual needs will be supported Outcome: Progressing   Problem: Respiratory: Goal: Will maintain a patent airway Outcome: Progressing Goal: Complications related to the disease process, condition or treatment will be avoided or minimized Outcome: Progressing   Problem: Education: Goal: Knowledge of General Education information will improve Description: Including pain rating scale, medication(s)/side effects and non-pharmacologic comfort measures Outcome: Progressing   Problem: Health Behavior/Discharge Planning: Goal: Ability to manage health-related needs will improve Outcome: Progressing   Problem: Clinical Measurements: Goal: Ability to maintain clinical measurements within normal limits will improve Outcome: Progressing Goal: Will remain free from infection Outcome: Progressing Goal: Diagnostic test results will improve Outcome: Progressing Goal: Respiratory complications will improve Outcome: Progressing Goal: Cardiovascular complication will be avoided Outcome: Progressing   Problem: Activity: Goal: Risk for activity intolerance will decrease Outcome: Progressing   Problem: Nutrition: Goal: Adequate nutrition will be maintained Outcome: Progressing   Problem: Coping: Goal: Level of anxiety will decrease Outcome: Progressing   Problem: Elimination: Goal: Will not experience complications related to bowel motility Outcome: Progressing Goal: Will not experience complications related to urinary retention Outcome: Progressing   Problem: Pain Managment: Goal: General experience of comfort will improve and/or be  controlled Outcome: Progressing   Problem: Safety: Goal: Ability to remain free from injury will improve Outcome: Progressing   Problem: Skin Integrity: Goal: Risk for impaired skin integrity will decrease Outcome: Progressing   Problem: Activity: Goal: Ability to tolerate increased activity will improve Outcome: Progressing   Problem: Respiratory: Goal: Ability to maintain a clear airway and adequate ventilation will improve Outcome: Progressing   Problem: Role Relationship: Goal: Method of communication will improve Outcome: Progressing

## 2023-12-16 NOTE — Progress Notes (Addendum)
 PROGRESS NOTE                                                                                                                                                                                                             Patient Demographics:    Carolyn Russo, is a 73 y.o. female, DOB - April 24, 1951, ZOX:[id number]  Outpatient Primary MD for the patient is Annita Brod, MD    LOS - 30  Admit date - 11/16/2023    Chief Complaint  Patient presents with   Chest Pain   Dialysis pt       Brief Narrative (HPI from H&P)   73 yo female with ESRD on HD MWF, CHF, previous CVA with residual left sided weakness, T2DM, right BKA, and seizure disorder. She presented to [facility] on 1/23 with confusion, left sided chest pain, shortness of breath, and coughing. She was found to be hypoxic on room air and placed on 2 L Combs. CT chest was done without evidence of PE.  MRI revealed a 1.3cm acute to early subacute right PICA infarct. Also was admitted with pneumonia due to COVID and RSV and was started on antibiotics for likely superimposed bacterial infection.    On 1/30 early morning the patient was ~2 hrs into dialysis when her blood pressure dropped. UF goal was decreased but since the patient's level of consciousnesses decreased, she received 300 cc NS bolus. EKG changes were noted and rapid response was activated, dialysis stopped, and blood returned to the patient. Around 6:30 AM the patient was noted to be apneic without a pulse and code blue was called. Patient was noted to have PEA arrest. CPR was done for a few minutes before ROSC was achieved. She was transferred to 74M and patient was intubated due to failure to protect airway.  She continued to be tenuous and minimally responsive, finally she was extubated on 12/03/2023 transferred to my care on 12/09/2023, early in the morning she was fairly stable however within a few hours she had an episode of  emesis witnessed by her husband subsequently developed A-fib RVR and respiratory distress, cardiology PCCM again called bedside and she was transferred to ICU, she stayed there for another 5 days stabilized however still dependent on NG tube feeds, was seen by palliative care.  She was transferred to my service on 12/16/2023 again.  Overall condition report is extremely tenuous, still very high risk for aspiration, still overall extremely weak and deconditioned, family for now wants to pursue all modalities of treatment.  She is currently on NG tube, needs 5-day vacation from DAPT to get a peg tube.   Significant Hospital Events & Prcedures:    1/23 admit to [facility] for acute infarct + covid/rsv pneumonia 1/30 admit to ICU after patient had PEA arrest  2/1 Failed SBT due to apnea. ABG with respiratory alkalosis. RR decreased 2/2 Tolerating SBT. Remains encephalopathic 2/5 Continues to tolerate SBT during day, but remains encephalopathic/unable to protect airway if extubated  2/9 extubated, off precedex 2/12 still lethargic, ABG 2/11 unremarkable, limit centrally acting meds 2/15 transferred to my care, still somnolent on NG tube feeds, aspirated her vomitus, again in respiratory distress, considered back to ICU 12/14/2023 transferred by from ICU, transferred to my service on 12/16/2023       Subjective:    Carolyn Russo today in bed, appears to be in no distress but overall appears very weak and frail, NG tube in place, denies any headache or chest pain,   Assessment  & Plan :    PEA arrest: due to hypotension during HD, not primarily cardiac in nature, intubated for airway protection and stabilized in ICU, seen by cardiology initially, now stable from cardiac arrest standpoint.  Acute encephalopathy: secondary to post-arrest, stroke, metabolic derangements, and drug induced. Concern for cefepime toxicity given her hx of ESRD, med was discontinued 2/4. MRI brain did show a new and acute central  left cerebellar infarct.  Appreciate neurology recs.  Stroke not felt to be significant enough to explain altered mental status, Seroquel and amitriptyline have been stopped, mentation gradually improving now.  Still unable to swallow safely still NG tube bound on 12/16/2023.  Aspiration on 12/09/2023 followed by acute hypoxic respiratory failure with respiratory distress and A-fib RVR.  Treated in ICU, now off of antibiotics, still on NG tube feeds, still has dysphagia, still high risk for aspiration of oral contents, likely will get PEG tube in the next few days after she is off of DAPT per IR.  Paroxysmal atrial fibrillation with question of possible underlying sick sinus syndrome.  Seen by cardiology.  Currently on oral amiodarone.  Long-term anticoagulation not recommended by cardiology this admission.  Hypertension  -Currently on hydralazine via   tube.   Leukocytosis Sinusitis   - MRI brain noted progressed mastoid effusions, she has finished her treatment with Augmentin   Pneumonia due to COVID and RSV - Productive Cough, subsequently aspiration pneumonia.  Initial treatment for this problem off of antibiotics now.  Will try and keep tube feed rate around 35 cc an hour as she tends to aspirate on higher rate of tube feed.  Acute to early subacute right PICA infarct on 1/24, resolved New acute central left cerebellar infarct  - Appreciate neuro follow up , on DAPT with aspirin and brilinta along with statin, she is now awaiting PEG tube placement, per IR request DAPT will be held for 5 days with resumption on 12/22/2023, cleared by neurology to do that.  - Needs 30d heart monitor at discharge   Abnormal C6 signal MRI showed a nonspecific bone marrow signal of c6 vertebral body - unclear if related to a malignancy vs discitis/osteomyelitis. Will hold off on C spine imaging, as family does not want to pursue this workup and priority is mental status/neuro recovery.  - If no other findings,  consider C  spine MRI to further evaluate   Seizure disorder - Continue home keppra 500 mg BID seen by neurology this admission.   ESRD on HD MWF - Dialysis per nephro - Discontinued renvela due to low phos levels   Anemia of chronic disease - 1 unit blood on 2/10, Hgb stable - trend CBC - Aranesp every Friday    Diabetes - Continue SSI along with long-acting insulin - Tube feeds  CBG (last 3)  Recent Labs    12/16/23 0040 12/16/23 0417 12/16/23 0817  GLUCAP 191* 177* 165*         Condition - Extremely Guarded  Family Communication  :  husband x 2 on 12/09/23  Code Status :  Full  Consults  :  PCCM, Cards, Pall Care, Renal,     PUD Prophylaxis : PPI   Procedures  :            Disposition Plan  :    Status is: Inpatient    DVT Prophylaxis  :    SCDs Start: 11/23/23 0716 heparin injection 5,000 Units Start: 11/17/23 0930     Lab Results  Component Value Date   PLT 542 (H) 12/16/2023    Diet :  Diet Order             Diet NPO time specified  Diet effective now                    Inpatient Medications  Scheduled Meds:  amiodarone  200 mg Per Tube BID   arformoterol  15 mcg Nebulization BID   budesonide (PULMICORT) nebulizer solution  0.5 mg Nebulization BID   Chlorhexidine Gluconate Cloth  6 each Topical Q0600   Chlorhexidine Gluconate Cloth  6 each Topical Q0600   darbepoetin (ARANESP) injection - DIALYSIS  150 mcg Subcutaneous Q Mon-1800   ferrous sulfate  300 mg Per Tube Daily   fiber supplement (BANATROL TF)  60 mL Per Tube BID   heparin injection (subcutaneous)  5,000 Units Subcutaneous Q8H   hydrALAZINE  50 mg Per Tube Q8H   insulin aspart  0-15 Units Subcutaneous Q4H   insulin aspart  4 Units Subcutaneous Q4H   insulin glargine-yfgn  5 Units Subcutaneous Daily   levETIRAcetam  500 mg Per Tube BID   multivitamin  1 tablet Per Tube QHS   nutrition supplement (JUVEN)  1 packet Per Tube BID BM   mouth rinse  15 mL Mouth  Rinse Q4H   rosuvastatin  10 mg Per Tube Daily   sodium chloride flush  3 mL Intravenous Q12H   Continuous Infusions:  feeding supplement (VITAL 1.5 CAL) Stopped (12/16/23 0810)   PRN Meds:.acetaminophen **OR** acetaminophen, docusate, ipratropium-albuterol, labetalol, ondansetron (ZOFRAN) IV, mouth rinse, polyethylene glycol, Racepinephrine HCl, white petrolatum, witch hazel-glycerin    Objective:   Vitals:   12/16/23 0418 12/16/23 0500 12/16/23 0818 12/16/23 0827  BP: (!) 148/48  (!) 157/44   Pulse: 83  82   Resp: (!) 26     Temp: 98.5 F (36.9 C)  98.2 F (36.8 C)   TempSrc: Axillary  Axillary   SpO2: 100%  99% 100%  Weight:  72.2 kg    Height:        Wt Readings from Last 3 Encounters:  12/16/23 72.2 kg  11/01/23 79.5 kg  08/24/23 83 kg     Intake/Output Summary (Last 24 hours) at 12/16/2023 1105 Last data filed at 12/15/2023 2150 Gross per 24 hour  Intake  90 ml  Output --  Net 90 ml     Physical Exam  Awake but appears weak and lethargic, responds to loud verbal commands and follows some basic instructions, NG tube in place, R BKA [place].AT,PERRAL Supple Neck, No JVD,   Symmetrical Chest wall movement, Good air movement bilaterally, bilateral breath sounds RRR,No Gallops,Rubs or new Murmurs,  +ve B.Sounds, Abd Soft, No tenderness,   No Cyanosis, Clubbing or edema     RN pressure injury documentation: Pressure Injury 12/02/23 Sacrum Medial Stage 2 -  Partial thickness loss of dermis presenting as a shallow open injury with a red, pink wound bed without slough. (Active)  12/02/23 1402  Location: Sacrum  Location Orientation: Medial  Staging: Stage 2 -  Partial thickness loss of dermis presenting as a shallow open injury with a red, pink wound bed without slough.  Wound Description (Comments):   Present on Admission: No  Dressing Type Foam - Lift dressing to assess site every shift 12/16/23 0010      Data Review:    Recent Labs  Lab 12/12/23 0531  12/13/23 0758 12/14/23 0326 12/15/23 0359 12/16/23 0621  WBC 24.8* 21.1* 24.7* 22.9* 23.9*  HGB 8.6* 8.1* 8.4* 8.5* 8.6*  HCT 25.1* 23.8* 24.7* 25.1* 25.6*  PLT 519* 498* 535* 523* 542*  MCV 88.1 89.8 90.5 91.3 92.8  MCH 30.2 30.6 30.8 30.9 31.2  MCHC 34.3 34.0 34.0 33.9 33.6  RDW 17.2* 17.2* 17.5* 17.7* 18.1*  LYMPHSABS  --   --   --  3.0 2.6  MONOABS  --   --   --  1.9* 1.6*  EOSABS  --   --   --  0.3 0.3  BASOSABS  --   --   --  0.1 0.1    Recent Labs  Lab 12/11/23 1620 12/12/23 0531 12/13/23 0758 12/13/23 0816 12/14/23 0326 12/15/23 0359 12/16/23 0621  NA 133* 134* 134*  --  137 136 137  K 5.2* 5.0 4.7  --  5.3* 4.8 5.4*  CL 93* 93* 95*  --  99 96* 96*  CO2 23 24 26   --  27 28 30   ANIONGAP 17* 17* 13  --  11 12 11   GLUCOSE 233* 291* 213*  --  215* 186* 206*  BUN 66* 77* 59*  --  83* 54* 85*  CREATININE 5.39* 5.90* 4.12*  --  5.10* 3.26* 4.87*  MG 2.2  --   --  2.2  --   --   --   CALCIUM 9.7 9.7 8.8*  --  9.4 9.3 9.7      Recent Labs  Lab 12/11/23 1620 12/12/23 0531 12/13/23 0758 12/13/23 0816 12/14/23 0326 12/15/23 0359 12/16/23 0621  MG 2.2  --   --  2.2  --   --   --   CALCIUM 9.7 9.7 8.8*  --  9.4 9.3 9.7    --------------------------------------------------------------------------------------------------------------- Lab Results  Component Value Date   CHOL 90 11/17/2023   HDL 38 (L) 11/17/2023   LDLCALC 37 11/17/2023   TRIG 86 12/02/2023   CHOLHDL 2.4 11/17/2023    Lab Results  Component Value Date   HGBA1C 5.8 (H) 10/26/2023   No results for input(s): "TSH", "T4TOTAL", "FREET4", "T3FREE", "THYROIDAB" in the last 72 hours. No results for input(s): "VITAMINB12", "FOLATE", "FERRITIN", "TIBC", "IRON", "RETICCTPCT" in the last 72 hours. ------------------------------------------------------------------------------------------------------------------ Cardiac Enzymes No results for input(s): "CKMB", "TROPONINI", "MYOGLOBIN" in the last 168  hours.  Invalid input(s): "CK"  Micro  Results No results found for this or any previous visit (from the past 240 hours).  Radiology Report DG Chest Port 1 View Result Date: 12/16/2023 CLINICAL DATA:  Shortness of breath. EXAM: PORTABLE CHEST 1 VIEW COMPARISON:  December 12, 2023. FINDINGS: Stable cardiomegaly. Feeding tube is seen entering stomach. Right internal jugular dialysis catheter is unchanged. Minimal right basilar subsegmental atelectasis is noted. Mild left retrocardiac opacity is noted concerning for atelectasis or infiltrate with associated effusion. Bony thorax is unremarkable. IMPRESSION: Stable left retrocardiac opacity as noted above. Minimal right basilar subsegmental atelectasis. Electronically Signed   By: Lupita Raider M.D.   On: 12/16/2023 07:52     Signature  -   Susa Raring M.D on 12/16/2023 at 11:05 AM   -  To page go to [email or web address]

## 2023-12-16 NOTE — Progress Notes (Signed)
   12/16/23 2039  Vitals  Temp 98.6 F (37 C)  Temp Source Oral  BP (!) 120/45  MAP (mmHg) 67  BP Location Left Arm  BP Method Automatic  Patient Position (if appropriate) Lying  Pulse Rate 81  Pulse Rate Source Monitor  ECG Heart Rate 82  Resp (!) 25  Oxygen Therapy  SpO2 99 %  During Treatment Monitoring  Blood Flow Rate (mL/min) 349 mL/min  Arterial Pressure (mmHg) -144.03 mmHg  Venous Pressure (mmHg) 122.42 mmHg  TMP (mmHg) -8.68 mmHg  Ultrafiltration Rate (mL/min) 725 mL/min  Dialysate Flow Rate (mL/min) 299 ml/min  Dialysate Potassium Concentration 2  Dialysate Calcium Concentration 2.5  Duration of HD Treatment -hour(s) 2.5 hour(s)  Cumulative Fluid Removed (mL) per Treatment  1500.16  Intra-Hemodialysis Comments Tx completed  Post Treatment  Dialyzer Clearance Lightly streaked  Liters Processed 52.5  Fluid Removed (mL) 1500 mL  Tolerated HD Treatment Yes  Post-Hemodialysis Comments Pt stable  Hemodialysis Catheter Right Subclavian  No placement date or time found.   Orientation: Right  Access Location: Subclavian  Site Condition No complications  Blue Lumen Status Saline locked  Red Lumen Status Saline locked  Dressing Type Transparent  Dressing Status Antimicrobial disc/dressing in place;Clean, Dry, Intact  Dressing Change Due 12/19/23   PT. Stable.

## 2023-12-16 NOTE — Progress Notes (Signed)
 [facility] Progress Note   Subjective:    Pt seen in room. No c/o today  Objective Vitals:   12/16/23 0418 12/16/23 0500 12/16/23 0818 12/16/23 0827  BP: (!) 148/48  (!) 157/44   Pulse: 83  82   Resp: (!) 26     Temp: 98.5 F (36.9 C)  98.2 F (36.8 C)   TempSrc: Axillary  Axillary   SpO2: 100%  99% 100%  Weight:  72.2 kg    Height:       Physical Exam General: Chronically ill-appearing, awake, not speaking Heart: S1 and S2; No murmurs, gallops, or rubs Lungs: rhonchi bilat are worse than yesterday Abdomen: Soft and non-tender Extremities:No LE edema Dialysis Access: R femoral TDC   Dialysis Orders: MWF NW  4h   B350    75.5kg   2/2 bath   R fem TDC  Heparin 2600 + 1400 midrun - last HD 1/22, getting to dry wt - venofer 100 qhd thru 1/31 - mircera 150 q2, last 1/17, due 1/31  Renal-related home meds: - rocaltrol 0.5 mwf - sensipar 30 every day - hydralazine 100 tid - losartan 50 bid - renvela 800mg  ac tid - others: brilinta, crestor, keppra, ritalin, neurontin, asa, PPI, dulera, insulin, albuterol, elavil  CXR 2/12, 2/15, 2/18 - no change, small R effusion, no edema/ congestion   Assessment/Plan: AHRF/ +COVID/ +RSV pna/ volume^: resolved SP course of abx + HD.  CVA: R then L cerebellar infarcts d/t small vessel disease. Per neurology.  On  DAPT AMS: multifactorial. Very slow improvement.  Aspiration pneumonitis - CXR clear 2/15, pt was moved back to ICU. ESRD: on HD MWF. Kept TTS off schedule this week. HD today.  HTN: on hydralazine at 50mg  tid, BP's okay  Volume: no edema on exam, CXR clear last 3x. UF 0- 1 L w/ HD Anemia of esrd: Hb 8- 9 range. ^'d the darbe to 200 mcg SQ qFri. Tsat 26% and ferritin 1638.  Getting po iron. Transfuse as needed Secondary hyperparathyroidism - phos in range. CCa is high, not on vdra.  Resume binders when taking po H/o seizure d/o - on Keppra PEA arrest  Vinson Moselle  MD  [facility] 12/16/2023, 10:07 AM  Recent  Labs  Lab 12/15/23 0359 12/16/23 0621  HGB 8.5* 8.6*  CALCIUM 9.3 9.7  CREATININE 3.26* 4.87*  K 4.8 5.4*    Inpatient medications:  amiodarone  200 mg Per Tube BID   arformoterol  15 mcg Nebulization BID   budesonide (PULMICORT) nebulizer solution  0.5 mg Nebulization BID   Chlorhexidine Gluconate Cloth  6 each Topical Q0600   Chlorhexidine Gluconate Cloth  6 each Topical Q0600   darbepoetin (ARANESP) injection - DIALYSIS  150 mcg Subcutaneous Q Mon-1800   ferrous sulfate  300 mg Per Tube Daily   fiber supplement (BANATROL TF)  60 mL Per Tube BID   heparin injection (subcutaneous)  5,000 Units Subcutaneous Q8H   hydrALAZINE  50 mg Per Tube Q8H   insulin aspart  0-15 Units Subcutaneous Q4H   insulin aspart  4 Units Subcutaneous Q4H   insulin glargine-yfgn  5 Units Subcutaneous Daily   levETIRAcetam  500 mg Per Tube BID   multivitamin  1 tablet Per Tube QHS   nutrition supplement (JUVEN)  1 packet Per Tube BID BM   mouth rinse  15 mL Mouth Rinse Q4H   rosuvastatin  10 mg Per Tube Daily   sodium chloride flush  3 mL Intravenous Q12H  feeding supplement (VITAL 1.5 CAL) Stopped (12/16/23 0810)   acetaminophen **OR** acetaminophen, docusate, ipratropium-albuterol, labetalol, ondansetron (ZOFRAN) IV, mouth rinse, polyethylene glycol, Racepinephrine HCl, white petrolatum, witch hazel-glycerin

## 2023-12-16 NOTE — Progress Notes (Signed)
 Tolerating her treatment as of this time. SBP ever went down below 100. Hand off to the night HD nurse.

## 2023-12-16 NOTE — Progress Notes (Signed)
 Chart review note  I was approached by the hospitalist at the request of IR about recommendations on holding aspirin and Brilinta for G-tube placement. The patient's strokes are due to small vessel etiology and she would benefit from being on dual antiplatelets but if the procedure requires antiplatelets be held, that should be okay so that she can continue to get these medications in the longer term.  That said, I did reach out to IR with the possibility of continuing aspirin and was told that a lot of operators are okay doing the procedure on baby aspirin but the others may not be. From my standpoint, I would prefer that only Brilinta be held for 5 days and aspirin be continued but if the operator would rather have both of them held, they should both be held. The caveat to the above is that not being on antiplatelets will definitely increase the risk of strokes but she had small strokes, likely not contributing much to her presenting complaints so I feel that the risk of withholding dual antiplatelets although not minimal by any means, is reasonable to get the G-tube placed. Please reach back out with questions as needed Plan was discussed with Dr. Thedore Mins from the hospitalist service and Aimee Han from IR.  Milon Dikes, MD Neurology  No charge note

## 2023-12-17 DIAGNOSIS — G9340 Encephalopathy, unspecified: Secondary | ICD-10-CM | POA: Diagnosis not present

## 2023-12-17 LAB — GLUCOSE, CAPILLARY
Glucose-Capillary: 260 mg/dL — ABNORMAL HIGH (ref 70–99)
Glucose-Capillary: 174 mg/dL — ABNORMAL HIGH (ref 70–99)
Glucose-Capillary: 207 mg/dL — ABNORMAL HIGH (ref 70–99)
Glucose-Capillary: 105 mg/dL — ABNORMAL HIGH (ref 70–99)
Glucose-Capillary: 164 mg/dL — ABNORMAL HIGH (ref 70–99)

## 2023-12-17 LAB — CBC WITH DIFFERENTIAL/PLATELET
Abs Immature Granulocytes: 0.19 10*3/uL — ABNORMAL HIGH (ref 0.00–0.07)
Basophils Absolute: 0.1 10*3/uL (ref 0.0–0.1)
Basophils Relative: 0 %
Eosinophils Absolute: 0.3 10*3/uL (ref 0.0–0.5)
Eosinophils Relative: 2 %
HCT: 23.2 % — ABNORMAL LOW (ref 36.0–46.0)
Hemoglobin: 7.7 g/dL — ABNORMAL LOW (ref 12.0–15.0)
Immature Granulocytes: 1 %
Lymphocytes Relative: 14 %
Lymphs Abs: 2.7 10*3/uL (ref 0.7–4.0)
MCH: 31 pg (ref 26.0–34.0)
MCHC: 33.2 g/dL (ref 30.0–36.0)
MCV: 93.5 fL (ref 80.0–100.0)
Monocytes Absolute: 1.6 10*3/uL — ABNORMAL HIGH (ref 0.1–1.0)
Monocytes Relative: 8 %
Neutro Abs: 14 10*3/uL — ABNORMAL HIGH (ref 1.7–7.7)
Neutrophils Relative %: 75 %
Platelets: 494 10*3/uL — ABNORMAL HIGH (ref 150–400)
RBC: 2.48 MIL/uL — ABNORMAL LOW (ref 3.87–5.11)
RDW: 18 % — ABNORMAL HIGH (ref 11.5–15.5)
WBC: 18.9 10*3/uL — ABNORMAL HIGH (ref 4.0–10.5)
nRBC: 0.3 % — ABNORMAL HIGH (ref 0.0–0.2)

## 2023-12-17 LAB — BASIC METABOLIC PANEL
Anion gap: 18 — ABNORMAL HIGH (ref 5–15)
BUN: 71 mg/dL — ABNORMAL HIGH (ref 8–23)
CO2: 26 mmol/L (ref 22–32)
Calcium: 10.1 mg/dL (ref 8.9–10.3)
Chloride: 92 mmol/L — ABNORMAL LOW (ref 98–111)
Creatinine, Ser: 4.46 mg/dL — ABNORMAL HIGH (ref 0.44–1.00)
GFR, Estimated: 10 mL/min — ABNORMAL LOW (ref >60)
Glucose, Bld: 231 mg/dL — ABNORMAL HIGH (ref 70–99)
Potassium: 5.2 mmol/L — ABNORMAL HIGH (ref 3.5–5.1)
Sodium: 136 mmol/L (ref 135–145)

## 2023-12-17 MED ORDER — SODIUM ZIRCONIUM CYCLOSILICATE 10 G PO PACK
10.0000 g | PACK | Freq: Three times a day (TID) | ORAL | Status: AC
  Administered 2023-12-17 (×2): 10 g via ORAL
  Filled 2023-12-17 (×2): qty 1

## 2023-12-17 MED ORDER — CHLORHEXIDINE GLUCONATE CLOTH 2 % EX PADS
6.0000 | MEDICATED_PAD | Freq: Every day | CUTANEOUS | Status: DC
  Administered 2023-12-17 – 2023-12-27 (×8): 6 via TOPICAL

## 2023-12-17 NOTE — Plan of Care (Signed)
   Problem: Coping: Goal: Ability to adjust to condition or change in health will improve Outcome: Progressing

## 2023-12-17 NOTE — Progress Notes (Signed)
 PROGRESS NOTE                                                                                                                                                                                                             Patient Demographics:    Carolyn Russo, is a 73 y.o. female, DOB - 03/08/1951, ZOX:[id number]  Outpatient Primary MD for the patient is Annita Brod, MD    LOS - 31  Admit date - 11/16/2023    Chief Complaint  Patient presents with   Chest Pain   Dialysis pt       Brief Narrative (HPI from H&P)   73 yo female with ESRD on HD MWF, CHF, previous CVA with residual left sided weakness, T2DM, right BKA, and seizure disorder. She presented to [facility] on 1/23 with confusion, left sided chest pain, shortness of breath, and coughing. She was found to be hypoxic on room air and placed on 2 L [place]. CT chest was done without evidence of PE.  MRI revealed a 1.3cm acute to early subacute right PICA infarct. Also was admitted with pneumonia due to COVID and RSV and was started on antibiotics for likely superimposed bacterial infection.    On 1/30 early morning the patient was ~2 hrs into dialysis when her blood pressure dropped. UF goal was decreased but since the patient's level of consciousnesses decreased, she received 300 cc NS bolus. EKG changes were noted and rapid response was activated, dialysis stopped, and blood returned to the patient. Around 6:30 AM the patient was noted to be apneic without a pulse and code blue was called. Patient was noted to have PEA arrest. CPR was done for a few minutes before ROSC was achieved. She was transferred to 94M and patient was intubated due to failure to protect airway.  She continued to be tenuous and minimally responsive, finally she was extubated on 12/03/2023 transferred to my care on 12/09/2023, early in the morning she was fairly stable however within a few hours she had an episode of  emesis witnessed by her husband subsequently developed A-fib RVR and respiratory distress, cardiology PCCM again called bedside and she was transferred to ICU, she stayed there for another 5 days stabilized however still dependent on NG tube feeds, was seen by palliative care.  She was transferred to my service on 12/16/2023 again.  Overall condition report is extremely tenuous, still very high risk for aspiration, still overall extremely weak and deconditioned, family for now wants to pursue all modalities of treatment.  She is currently on NG tube, needs 5-day vacation from DAPT to get a peg tube.   Significant Hospital Events & Prcedures:    1/23 admit to [facility] for acute infarct + covid/rsv pneumonia 1/30 admit to ICU after patient had PEA arrest  2/1 Failed SBT due to apnea. ABG with respiratory alkalosis. RR decreased 2/2 Tolerating SBT. Remains encephalopathic 2/5 Continues to tolerate SBT during day, but remains encephalopathic/unable to protect airway if extubated  2/9 extubated, off precedex 2/12 still lethargic, ABG 2/11 unremarkable, limit centrally acting meds 2/15 transferred to my care, still somnolent on NG tube feeds, aspirated her vomitus, again in respiratory distress, considered back to ICU 12/14/2023 transferred by from ICU, transferred to my service on 12/16/2023   Unfortunately overall patient continues to appear quite weak and frail, still NG tube feeding dependent, very weak cough reflex, very high risk for recurrent aspiration of oral secretions and regurgitated food material, overall still bedbound, overall prognosis appears poor explained to patient's husband clearly bedside on 12/17/2023.    Subjective:   Patient in bed in no distress but overall still sleepy, appears weak and lethargic, denies any headache or chest pain.   Assessment  & Plan :    PEA arrest: due to hypotension during HD, not primarily cardiac in nature, intubated for airway protection and stabilized  in ICU, seen by cardiology initially, now stable from cardiac arrest standpoint.  Acute encephalopathy: secondary to post-arrest, stroke, metabolic derangements, and drug induced. Concern for cefepime toxicity given her hx of ESRD, med was discontinued 2/4. MRI brain did show a new and acute central left cerebellar infarct.  Appreciate neurology recs.  Stroke not felt to be significant enough to explain altered mental status, Seroquel and amitriptyline have been stopped, mentation gradually improving now.  Still unable to swallow safely still NG tube bound on 12/16/2023.  Aspiration on 12/09/2023 followed by acute hypoxic respiratory failure with respiratory distress and A-fib RVR.  Treated in ICU, now off of antibiotics, still on NG tube feeds, still has dysphagia, still high risk for aspiration of oral contents, likely will get PEG tube in the next few days after she is off of DAPT per IR.  Paroxysmal atrial fibrillation with question of possible underlying sick sinus syndrome.  Seen by cardiology.  Currently on oral amiodarone.  Long-term anticoagulation not recommended by cardiology this admission.  Hypertension  -Currently on hydralazine via   tube.   Leukocytosis Sinusitis   - MRI brain noted progressed mastoid effusions, she has finished her treatment with Augmentin   Pneumonia due to COVID and RSV - Productive Cough, subsequently aspiration pneumonia.  Initial treatment for this problem off of antibiotics now.  Will try and keep tube feed rate around 35 cc an hour as she tends to aspirate on higher rate of tube feed.  Acute to early subacute right PICA infarct on 1/24, resolved New acute central left cerebellar infarct  - Appreciate neuro follow up , on DAPT with aspirin and brilinta along with statin, she is now awaiting PEG tube placement, per IR request DAPT will be held for 5 days with resumption on 12/22/2023, cleared by neurology to do that.  - Needs 30d heart monitor at discharge    Abnormal C6 signal MRI showed a nonspecific bone marrow signal of c6 vertebral body - unclear  if related to a malignancy vs discitis/osteomyelitis. Will hold off on C spine imaging, as family does not want to pursue this workup and priority is mental status/neuro recovery.  - If no other findings, consider C spine MRI to further evaluate   Seizure disorder - Continue home keppra 500 mg BID seen by neurology this admission.   ESRD on HD MWF - Dialysis per nephro - Discontinued renvela due to low phos levels   Anemia of chronic disease - 1 unit blood on 2/10, Hgb stable - trend CBC - Aranesp every Friday    Diabetes - Continue SSI along with long-acting insulin - Tube feeds  CBG (last 3)  Recent Labs    12/16/23 2225 12/17/23 0404 12/17/23 0819  GLUCAP 247* 260* 174*         Condition - Extremely Guarded  Family Communication  :  husband x 2 on 12/09/23, husband bedside 12/17/2023  Code Status :  Full  Consults  :  PCCM, Cards, Pall Care, Renal,     PUD Prophylaxis : PPI   Procedures  :            Disposition Plan  :    Status is: Inpatient    DVT Prophylaxis  :    SCDs Start: 11/23/23 0716 heparin injection 5,000 Units Start: 11/17/23 0930     Lab Results  Component Value Date   PLT 494 (H) 12/17/2023    Diet :  Diet Order             Diet NPO time specified  Diet effective now                    Inpatient Medications  Scheduled Meds:  arformoterol  15 mcg Nebulization BID   budesonide (PULMICORT) nebulizer solution  0.5 mg Nebulization BID   Chlorhexidine Gluconate Cloth  6 each Topical Q0600   darbepoetin (ARANESP) injection - DIALYSIS  150 mcg Subcutaneous Q Mon-1800   ferrous sulfate  300 mg Per Tube Daily   fiber supplement (BANATROL TF)  60 mL Per Tube BID   heparin injection (subcutaneous)  5,000 Units Subcutaneous Q8H   hydrALAZINE  50 mg Per Tube Q8H   insulin aspart  0-15 Units Subcutaneous Q4H   insulin aspart  4  Units Subcutaneous Q4H   insulin glargine-yfgn  5 Units Subcutaneous Daily   levETIRAcetam  500 mg Per Tube BID   multivitamin  1 tablet Per Tube QHS   nutrition supplement (JUVEN)  1 packet Per Tube BID BM   mouth rinse  15 mL Mouth Rinse Q4H   rosuvastatin  10 mg Per Tube Daily   sodium chloride flush  3 mL Intravenous Q12H   sodium zirconium cyclosilicate  10 g Oral TID   Continuous Infusions:  feeding supplement (VITAL 1.5 CAL) 1,000 mL (12/17/23 0934)   PRN Meds:.acetaminophen **OR** acetaminophen, docusate, ipratropium-albuterol, labetalol, ondansetron (ZOFRAN) IV, mouth rinse, polyethylene glycol, Racepinephrine HCl, white petrolatum, witch hazel-glycerin    Objective:   Vitals:   12/17/23 0400 12/17/23 0500 12/17/23 0558 12/17/23 0821  BP: 133/73  (!) 152/64 (!) 131/46  Pulse: 79   81  Resp: 20   18  Temp:    (!) 97.5 F (36.4 C)  TempSrc:    Oral  SpO2: 100%   100%  Weight:  70.5 kg    Height:        Wt Readings from Last 3 Encounters:  12/17/23 70.5 kg  11/01/23 79.5  kg  08/24/23 83 kg     Intake/Output Summary (Last 24 hours) at 12/17/2023 0950 Last data filed at 12/16/2023 2334 Gross per 24 hour  Intake 3 ml  Output 1500 ml  Net -1497 ml     Physical Exam  Awake but appears weak and lethargic, responds to loud verbal commands and follows some basic instructions, NG tube in place, R BKA [place].AT,PERRAL Supple Neck, No JVD,   Symmetrical Chest wall movement, Good air movement bilaterally, bilateral breath sounds RRR,No Gallops,Rubs or new Murmurs,  +ve B.Sounds, Abd Soft, No tenderness,   No Cyanosis, Clubbing or edema     RN pressure injury documentation: Pressure Injury 12/02/23 Sacrum Medial Stage 2 -  Partial thickness loss of dermis presenting as a shallow open injury with a red, pink wound bed without slough. (Active)  12/02/23 1402  Location: Sacrum  Location Orientation: Medial  Staging: Stage 2 -  Partial thickness loss of dermis presenting  as a shallow open injury with a red, pink wound bed without slough.  Wound Description (Comments):   Present on Admission: No  Dressing Type Foam - Lift dressing to assess site every shift 12/16/23 2253      Data Review:    Recent Labs  Lab 12/13/23 0758 12/14/23 0326 12/15/23 0359 12/16/23 0621 12/17/23 0535  WBC 21.1* 24.7* 22.9* 23.9* 18.9*  HGB 8.1* 8.4* 8.5* 8.6* 7.7*  HCT 23.8* 24.7* 25.1* 25.6* 23.2*  PLT 498* 535* 523* 542* 494*  MCV 89.8 90.5 91.3 92.8 93.5  MCH 30.6 30.8 30.9 31.2 31.0  MCHC 34.0 34.0 33.9 33.6 33.2  RDW 17.2* 17.5* 17.7* 18.1* 18.0*  LYMPHSABS  --   --  3.0 2.6 2.7  MONOABS  --   --  1.9* 1.6* 1.6*  EOSABS  --   --  0.3 0.3 0.3  BASOSABS  --   --  0.1 0.1 0.1    Recent Labs  Lab 12/11/23 1620 12/12/23 0531 12/13/23 0758 12/13/23 0816 12/14/23 0326 12/15/23 0359 12/16/23 0621 12/17/23 0535  NA 133*   < > 134*  --  137 136 137 136  K 5.2*   < > 4.7  --  5.3* 4.8 5.4* 5.2*  CL 93*   < > 95*  --  99 96* 96* 92*  CO2 23   < > 26  --  27 28 30 26   ANIONGAP 17*   < > 13  --  11 12 11  18*  GLUCOSE 233*   < > 213*  --  215* 186* 206* 231*  BUN 66*   < > 59*  --  83* 54* 85* 71*  CREATININE 5.39*   < > 4.12*  --  5.10* 3.26* 4.87* 4.46*  MG 2.2  --   --  2.2  --   --   --   --   CALCIUM 9.7   < > 8.8*  --  9.4 9.3 9.7 10.1   < > = values in this interval not displayed.      Recent Labs  Lab 12/11/23 1620 12/12/23 0531 12/13/23 0758 12/13/23 0816 12/14/23 0326 12/15/23 0359 12/16/23 0621 12/17/23 0535  MG 2.2  --   --  2.2  --   --   --   --   CALCIUM 9.7   < > 8.8*  --  9.4 9.3 9.7 10.1   < > = values in this interval not displayed.    --------------------------------------------------------------------------------------------------------------- Lab Results  Component Value Date  CHOL 90 11/17/2023   HDL 38 (L) 11/17/2023   LDLCALC 37 11/17/2023   TRIG 86 12/02/2023   CHOLHDL 2.4 11/17/2023    Lab Results  Component  Value Date   HGBA1C 5.8 (H) 10/26/2023   No results for input(s): "TSH", "T4TOTAL", "FREET4", "T3FREE", "THYROIDAB" in the last 72 hours. No results for input(s): "VITAMINB12", "FOLATE", "FERRITIN", "TIBC", "IRON", "RETICCTPCT" in the last 72 hours. ------------------------------------------------------------------------------------------------------------------ Cardiac Enzymes No results for input(s): "CKMB", "TROPONINI", "MYOGLOBIN" in the last 168 hours.  Invalid input(s): "CK"  Micro Results No results found for this or any previous visit (from the past 240 hours).  Radiology Report DG Chest Port 1 View Result Date: 12/16/2023 CLINICAL DATA:  Shortness of breath. EXAM: PORTABLE CHEST 1 VIEW COMPARISON:  December 12, 2023. FINDINGS: Stable cardiomegaly. Feeding tube is seen entering stomach. Right internal jugular dialysis catheter is unchanged. Minimal right basilar subsegmental atelectasis is noted. Mild left retrocardiac opacity is noted concerning for atelectasis or infiltrate with associated effusion. Bony thorax is unremarkable. IMPRESSION: Stable left retrocardiac opacity as noted above. Minimal right basilar subsegmental atelectasis. Electronically Signed   By: Lupita Raider M.D.   On: 12/16/2023 07:52     Signature  -   Susa Raring M.D on 12/17/2023 at 9:50 AM   -  To page go to [email or web address]

## 2023-12-17 NOTE — Plan of Care (Signed)
  Problem: Coping: Goal: Ability to adjust to condition or change in health will improve Outcome: Progressing   Problem: Fluid Volume: Goal: Ability to maintain a balanced intake and output will improve Outcome: Progressing   Problem: Metabolic: Goal: Ability to maintain appropriate glucose levels will improve Outcome: Progressing   Problem: Nutritional: Goal: Maintenance of adequate nutrition will improve Outcome: Progressing   Problem: Skin Integrity: Goal: Risk for impaired skin integrity will decrease Outcome: Progressing   Problem: Education: Goal: Knowledge of disease or condition will improve Outcome: Progressing   Problem: Coping: Goal: Will verbalize positive feelings about self Outcome: Progressing Goal: Will identify appropriate support needs Outcome: Progressing

## 2023-12-17 NOTE — Progress Notes (Addendum)
 [facility] Progress Note   Subjective:    Pt seen in room. No c/o today  Objective Vitals:   12/17/23 0000 12/17/23 0400 12/17/23 0500 12/17/23 0558  BP: (!) 124/47 133/73  (!) 152/64  Pulse: 74 79    Resp: 16 20    Temp:      TempSrc:      SpO2: 99% 100%    Weight:   70.5 kg   Height:       Physical Exam General: Chronically ill-appearing, awake, not speaking Heart: S1 and S2; No murmurs, gallops, or rubs Lungs: rhonchi bilat are worse than yesterday Abdomen: Soft and non-tender Extremities:No LE edema Dialysis Access: R femoral TDC   Dialysis Orders: MWF NW  4h   B350    75.5kg   2/2 bath   R fem TDC  Heparin 2600 + 1400 midrun - last HD 1/22, getting to dry wt - venofer 100 qhd thru 1/31 - mircera 150 q2, last 1/17, due 1/31  Renal-related home meds: - rocaltrol 0.5 mwf - sensipar 30 every day - hydralazine 100 tid - losartan 50 bid - renvela 800mg  ac tid - others: brilinta, crestor, keppra, ritalin, neurontin, asa, PPI, dulera, insulin, albuterol, elavil  CXR 2/18, 2/22 - small R effusion, no edema/ congestion   Assessment/Plan: AHRF/ +COVID/ +RSV pna/ volume^: resolved SP course of abx + HD.  CVA: R then L cerebellar infarcts d/t small vessel disease. Per neurology.  On  DAPT AMS: multifactorial. Very slow improvement.  Aspiration pneumonitis - pt was moved back to ICU. Now back on the floor again.  ESRD: on HD MWF. Was off schedule TTS this past week, plan HD tomorrow to get back on MWF schedule.  HTN: on hydralazine at 50mg  tid, BP's okay  Volume: no edema on exam, CXR clear 2/22. UF 1 L w/ HD as tolerated Monday.  Anemia of esrd: Hb 8- 9 range. ^'d the darbe to 200 mcg SQ qFri. Tsat 26% and ferritin 1638.  Getting po iron. Transfuse as needed Secondary hyperparathyroidism - phos in range. CCa is high, not on vdra.  Resume binders when taking po H/o seizure d/o - on Keppra PEA arrest  Vinson Moselle  MD  [facility] 12/17/2023, 8:47 AM  Recent  Labs  Lab 12/16/23 0621 12/17/23 0535  HGB 8.6* 7.7*  CALCIUM 9.7 10.1  CREATININE 4.87* 4.46*  K 5.4* 5.2*    Inpatient medications:  arformoterol  15 mcg Nebulization BID   budesonide (PULMICORT) nebulizer solution  0.5 mg Nebulization BID   Chlorhexidine Gluconate Cloth  6 each Topical Q0600   Chlorhexidine Gluconate Cloth  6 each Topical Q0600   darbepoetin (ARANESP) injection - DIALYSIS  150 mcg Subcutaneous Q Mon-1800   ferrous sulfate  300 mg Per Tube Daily   fiber supplement (BANATROL TF)  60 mL Per Tube BID   heparin injection (subcutaneous)  5,000 Units Subcutaneous Q8H   hydrALAZINE  50 mg Per Tube Q8H   insulin aspart  0-15 Units Subcutaneous Q4H   insulin aspart  4 Units Subcutaneous Q4H   insulin glargine-yfgn  5 Units Subcutaneous Daily   levETIRAcetam  500 mg Per Tube BID   multivitamin  1 tablet Per Tube QHS   nutrition supplement (JUVEN)  1 packet Per Tube BID BM   mouth rinse  15 mL Mouth Rinse Q4H   rosuvastatin  10 mg Per Tube Daily   sodium chloride flush  3 mL Intravenous Q12H    feeding supplement (  VITAL 1.5 CAL) 35 mL/hr at 12/16/23 1204   acetaminophen **OR** acetaminophen, docusate, ipratropium-albuterol, labetalol, ondansetron (ZOFRAN) IV, mouth rinse, polyethylene glycol, Racepinephrine HCl, white petrolatum, witch hazel-glycerin

## 2023-12-18 ENCOUNTER — Inpatient Hospital Stay (HOSPITAL_COMMUNITY): Payer: Medicare PPO

## 2023-12-18 DIAGNOSIS — G9340 Encephalopathy, unspecified: Secondary | ICD-10-CM | POA: Diagnosis not present

## 2023-12-18 LAB — CBC WITH DIFFERENTIAL/PLATELET
Abs Immature Granulocytes: 0.35 10*3/uL — ABNORMAL HIGH (ref 0.00–0.07)
Basophils Absolute: 0.1 10*3/uL (ref 0.0–0.1)
Basophils Relative: 0 %
Eosinophils Absolute: 0.4 10*3/uL (ref 0.0–0.5)
Eosinophils Relative: 2 %
HCT: 23.8 % — ABNORMAL LOW (ref 36.0–46.0)
Hemoglobin: 8.1 g/dL — ABNORMAL LOW (ref 12.0–15.0)
Immature Granulocytes: 2 %
Lymphocytes Relative: 15 %
Lymphs Abs: 3 10*3/uL (ref 0.7–4.0)
MCH: 31.8 pg (ref 26.0–34.0)
MCHC: 34 g/dL (ref 30.0–36.0)
MCV: 93.3 fL (ref 80.0–100.0)
Monocytes Absolute: 2.3 10*3/uL — ABNORMAL HIGH (ref 0.1–1.0)
Monocytes Relative: 12 %
Neutro Abs: 13.7 10*3/uL — ABNORMAL HIGH (ref 1.7–7.7)
Neutrophils Relative %: 69 %
Platelets: 545 10*3/uL — ABNORMAL HIGH (ref 150–400)
RBC: 2.55 MIL/uL — ABNORMAL LOW (ref 3.87–5.11)
RDW: 18.1 % — ABNORMAL HIGH (ref 11.5–15.5)
WBC: 19.7 10*3/uL — ABNORMAL HIGH (ref 4.0–10.5)
nRBC: 0.2 % (ref 0.0–0.2)

## 2023-12-18 LAB — BASIC METABOLIC PANEL
Anion gap: 12 (ref 5–15)
BUN: 94 mg/dL — ABNORMAL HIGH (ref 8–23)
CO2: 28 mmol/L (ref 22–32)
Calcium: 9.8 mg/dL (ref 8.9–10.3)
Chloride: 98 mmol/L (ref 98–111)
Creatinine, Ser: 5.67 mg/dL — ABNORMAL HIGH (ref 0.44–1.00)
GFR, Estimated: 7 mL/min — ABNORMAL LOW (ref >60)
Glucose, Bld: 173 mg/dL — ABNORMAL HIGH (ref 70–99)
Potassium: 5.5 mmol/L — ABNORMAL HIGH (ref 3.5–5.1)
Sodium: 138 mmol/L (ref 135–145)

## 2023-12-18 LAB — GLUCOSE, CAPILLARY
Glucose-Capillary: 165 mg/dL — ABNORMAL HIGH (ref 70–99)
Glucose-Capillary: 174 mg/dL — ABNORMAL HIGH (ref 70–99)
Glucose-Capillary: 186 mg/dL — ABNORMAL HIGH (ref 70–99)
Glucose-Capillary: 190 mg/dL — ABNORMAL HIGH (ref 70–99)
Glucose-Capillary: 191 mg/dL — ABNORMAL HIGH (ref 70–99)
Glucose-Capillary: 94 mg/dL (ref 70–99)

## 2023-12-18 LAB — BRAIN NATRIURETIC PEPTIDE: B Natriuretic Peptide: 138.7 pg/mL — ABNORMAL HIGH (ref 0.0–100.0)

## 2023-12-18 LAB — C-REACTIVE PROTEIN: CRP: 1.7 mg/dL — ABNORMAL HIGH (ref <1.0)

## 2023-12-18 LAB — PROCALCITONIN: Procalcitonin: 1.47 ng/mL

## 2023-12-18 MED ORDER — INSULIN GLARGINE 100 UNIT/ML ~~LOC~~ SOLN
5.0000 [IU] | Freq: Every day | SUBCUTANEOUS | Status: DC
  Administered 2023-12-19: 5 [IU] via SUBCUTANEOUS
  Filled 2023-12-18: qty 0.05

## 2023-12-18 MED ORDER — NEPRO/CARBSTEADY PO LIQD
1000.0000 mL | ORAL | Status: DC
Start: 2023-12-18 — End: 2023-12-20
  Administered 2023-12-18: 1000 mL
  Filled 2023-12-18 (×2): qty 1000

## 2023-12-18 MED ORDER — IPRATROPIUM-ALBUTEROL 0.5-2.5 (3) MG/3ML IN SOLN
3.0000 mL | Freq: Once | RESPIRATORY_TRACT | Status: AC
  Administered 2023-12-18: 3 mL via RESPIRATORY_TRACT
  Filled 2023-12-18: qty 3

## 2023-12-18 MED ORDER — HEPARIN SODIUM (PORCINE) 1000 UNIT/ML DIALYSIS: 2500.0000 [IU] | Freq: Once | INTRAMUSCULAR | Status: DC

## 2023-12-18 MED ORDER — HEPARIN SODIUM (PORCINE) 1000 UNIT/ML IJ SOLN
3800.0000 [IU] | Freq: Once | INTRAMUSCULAR | Status: AC
  Administered 2023-12-18: 3800 [IU]
  Filled 2023-12-18: qty 4

## 2023-12-18 MED ORDER — ANTICOAGULANT SODIUM CITRATE 4% (200MG/5ML) IV SOLN
5.0000 mL | Status: DC | PRN
  Filled 2023-12-18: qty 5

## 2023-12-18 MED ORDER — HEPARIN SODIUM (PORCINE) 1000 UNIT/ML DIALYSIS: 1500.0000 [IU] | INTRAMUSCULAR | Status: DC | PRN

## 2023-12-18 MED ORDER — HEPARIN SODIUM (PORCINE) 1000 UNIT/ML IJ SOLN
3200.0000 [IU] | Freq: Once | INTRAMUSCULAR | Status: AC
  Administered 2023-12-20: 3200 [IU]

## 2023-12-18 MED ORDER — FREE WATER
100.0000 mL | Freq: Four times a day (QID) | Status: DC
  Administered 2023-12-18 – 2023-12-22 (×17): 100 mL

## 2023-12-18 MED ORDER — ASPIRIN 81 MG PO CHEW
81.0000 mg | CHEWABLE_TABLET | Freq: Every day | ORAL | Status: DC
  Administered 2023-12-18 – 2023-12-19 (×2): 81 mg
  Filled 2023-12-18 (×2): qty 1

## 2023-12-18 MED ORDER — CLOPIDOGREL BISULFATE 75 MG PO TABS
75.0000 mg | ORAL_TABLET | Freq: Every day | ORAL | Status: DC
  Administered 2023-12-18 – 2023-12-19 (×2): 75 mg
  Filled 2023-12-18 (×2): qty 1

## 2023-12-18 NOTE — Plan of Care (Signed)
 Problem: Coping: Goal: Ability to adjust to condition or change in health will improve Outcome: Not Progressing   Problem: Fluid Volume: Goal: Ability to maintain a balanced intake and output will improve Outcome: Not Progressing   Problem: Health Behavior/Discharge Planning: Goal: Ability to identify and utilize available resources and services will improve Outcome: Not Progressing Goal: Ability to manage health-related needs will improve Outcome: Not Progressing   Problem: Metabolic: Goal: Ability to maintain appropriate glucose levels will improve Outcome: Not Progressing   Problem: Nutritional: Goal: Maintenance of adequate nutrition will improve Outcome: Not Progressing Goal: Progress toward achieving an optimal weight will improve Outcome: Not Progressing   Problem: Skin Integrity: Goal: Risk for impaired skin integrity will decrease Outcome: Not Progressing   Problem: Education: Goal: Knowledge of disease or condition will improve Outcome: Not Progressing Goal: Knowledge of secondary prevention will improve (MUST DOCUMENT ALL) Outcome: Not Progressing Goal: Knowledge of patient specific risk factors will improve (DELETE if not current risk factor) Outcome: Not Progressing   Problem: Ischemic Stroke/TIA Tissue Perfusion: Goal: Complications of ischemic stroke/TIA will be minimized Outcome: Not Progressing   Problem: Coping: Goal: Will verbalize positive feelings about self Outcome: Not Progressing Goal: Will identify appropriate support needs Outcome: Not Progressing   Problem: Health Behavior/Discharge Planning: Goal: Ability to manage health-related needs will improve Outcome: Not Progressing Goal: Goals will be collaboratively established with patient/family Outcome: Not Progressing   Problem: Self-Care: Goal: Ability to participate in self-care as condition permits will improve Outcome: Not Progressing Goal: Verbalization of feelings and concerns  over difficulty with self-care will improve Outcome: Not Progressing Goal: Ability to communicate needs accurately will improve Outcome: Not Progressing   Problem: Nutrition: Goal: Dietary intake will improve Outcome: Not Progressing   Problem: Education: Goal: Knowledge of risk factors and measures for prevention of condition will improve Outcome: Not Progressing   Problem: Coping: Goal: Psychosocial and spiritual needs will be supported Outcome: Not Progressing   Problem: Respiratory: Goal: Will maintain a patent airway Outcome: Not Progressing Goal: Complications related to the disease process, condition or treatment will be avoided or minimized Outcome: Not Progressing   Problem: Education: Goal: Knowledge of General Education information will improve Description: Including pain rating scale, medication(s)/side effects and non-pharmacologic comfort measures Outcome: Not Progressing   Problem: Health Behavior/Discharge Planning: Goal: Ability to manage health-related needs will improve Outcome: Not Progressing   Problem: Clinical Measurements: Goal: Ability to maintain clinical measurements within normal limits will improve Outcome: Not Progressing Goal: Will remain free from infection Outcome: Not Progressing Goal: Diagnostic test results will improve Outcome: Not Progressing Goal: Respiratory complications will improve Outcome: Not Progressing Goal: Cardiovascular complication will be avoided Outcome: Not Progressing   Problem: Activity: Goal: Risk for activity intolerance will decrease Outcome: Not Progressing   Problem: Nutrition: Goal: Adequate nutrition will be maintained Outcome: Not Progressing   Problem: Coping: Goal: Level of anxiety will decrease Outcome: Not Progressing   Problem: Elimination: Goal: Will not experience complications related to bowel motility Outcome: Not Progressing Goal: Will not experience complications related to urinary  retention Outcome: Not Progressing   Problem: Pain Managment: Goal: General experience of comfort will improve and/or be controlled Outcome: Not Progressing   Problem: Safety: Goal: Ability to remain free from injury will improve Outcome: Not Progressing   Problem: Skin Integrity: Goal: Risk for impaired skin integrity will decrease Outcome: Not Progressing   Problem: Activity: Goal: Ability to tolerate increased activity will improve Outcome: Not Progressing   Problem: Respiratory: Goal:  Ability to maintain a clear airway and adequate ventilation will improve Outcome: Not Progressing   Problem: Role Relationship: Goal: Method of communication will improve Outcome: Not Progressing

## 2023-12-18 NOTE — Progress Notes (Signed)
 Received patient in bed.Sob at rest noted.Consent verified.  Access used : Right HD catheter that worked well.Dressing on date.  Ne uf: 900 cc.  Tolerated treatment : yes.  Hand off to the patient's nurse: Back into her room with stable medical condition via transporter.

## 2023-12-18 NOTE — Progress Notes (Signed)
 PROGRESS NOTE                                                                                                                                                                                                             Patient Demographics:    Carolyn Russo, is a 73 y.o. female, DOB - February 17, 1951, WJX:[id number]  Outpatient Primary MD for the patient is Annita Brod, MD    LOS - 32  Admit date - 11/16/2023    Chief Complaint  Patient presents with   Chest Pain   Dialysis pt       Brief Narrative (HPI from H&P)   73 yo female with ESRD on HD MWF, CHF, previous CVA with residual left sided weakness, T2DM, right BKA, and seizure disorder. She presented to [facility] on 1/23 with confusion, left sided chest pain, shortness of breath, and coughing. She was found to be hypoxic on room air and placed on 2 L [place]. CT chest was done without evidence of PE.  MRI revealed a 1.3cm acute to early subacute right PICA infarct. Also was admitted with pneumonia due to COVID and RSV and was started on antibiotics for likely superimposed bacterial infection.    On 1/30 early morning the patient was ~2 hrs into dialysis when her blood pressure dropped. UF goal was decreased but since the patient's level of consciousnesses decreased, she received 300 cc NS bolus. EKG changes were noted and rapid response was activated, dialysis stopped, and blood returned to the patient. Around 6:30 AM the patient was noted to be apneic without a pulse and code blue was called. Patient was noted to have PEA arrest. CPR was done for a few minutes before ROSC was achieved. She was transferred to 58M and patient was intubated due to failure to protect airway.  She continued to be tenuous and minimally responsive, finally she was extubated on 12/03/2023 transferred to my care on 12/09/2023, early in the morning she was fairly stable however within a few hours she had an episode of  emesis witnessed by her husband subsequently developed A-fib RVR and respiratory distress, cardiology PCCM again called bedside and she was transferred to ICU, she stayed there for another 5 days stabilized however still dependent on NG tube feeds, was seen by palliative care.  She was transferred to my service on 12/16/2023 again.  Overall condition report is extremely tenuous, still very high risk for aspiration, still overall extremely weak and deconditioned, family for now wants to pursue all modalities of treatment.  She is currently on NG tube, needs 5-day vacation from DAPT to get a peg tube.   Significant Hospital Events & Prcedures:    1/23 admit to [facility] for acute infarct + covid/rsv pneumonia 1/30 admit to ICU after patient had PEA arrest  2/1 Failed SBT due to apnea. ABG with respiratory alkalosis. RR decreased 2/2 Tolerating SBT. Remains encephalopathic 2/5 Continues to tolerate SBT during day, but remains encephalopathic/unable to protect airway if extubated  2/9 extubated, off precedex 2/12 still lethargic, ABG 2/11 unremarkable, limit centrally acting meds 2/15 transferred to my care, still somnolent on NG tube feeds, aspirated her vomitus, again in respiratory distress, considered back to ICU 12/14/2023 transferred by from ICU, transferred to my service on 12/16/2023   Unfortunately overall patient continues to appear quite weak and frail, still NG tube feeding dependent, very weak cough reflex, very high risk for recurrent aspiration of oral secretions and regurgitated food material, overall still bedbound, overall prognosis appears poor explained to patient's husband clearly bedside on 12/17/2023.  Called and discussed with son on 12/18/2023 as well.    Subjective:   Patient in bed, appears comfortable, denies any headache, no fever, no chest pain or pressure, no shortness of breath , no abdominal pain. No new focal weakness.    Assessment  & Plan :    PEA arrest: due to  hypotension during HD, not primarily cardiac in nature, intubated for airway protection and stabilized in ICU, seen by cardiology initially, now stable from cardiac arrest standpoint.  Acute encephalopathy: secondary to post-arrest, stroke, metabolic derangements, and drug induced. Concern for cefepime toxicity given her hx of ESRD, med was discontinued 2/4. MRI brain did show a new and acute central left cerebellar infarct.  Appreciate neurology recs.  Stroke not felt to be significant enough to explain altered mental status, Seroquel and amitriptyline have been stopped, mentation gradually improving now.  Still unable to swallow safely still NG tube bound on 12/16/2023.  Aspiration on 12/09/2023 followed by acute hypoxic respiratory failure with respiratory distress and A-fib RVR.  Treated in ICU, now off of antibiotics, still on NG tube feeds, still has dysphagia, still high risk for aspiration of oral contents, peg tube was decided to be placed in the ICU however family wants to hold off till 2/29/2025 or later.  Acute to early subacute right PICA infarct on 1/24, resolved New acute central left cerebellar infarct  - Appreciate neuro follow up , on DAPT with aspirin and brilinta along with statin, note neuro was consulted as PEG tube was being planned and they are okay holding DAPT for 5 days however family wants to hold off on feeding tube till 2/29/2025 or later, they want to continue NG tube feeds for now  - Needs 30 d heart monitor at discharge  Paroxysmal atrial fibrillation with question of possible underlying sick sinus syndrome.  Seen by cardiology.  Currently on oral amiodarone.  Long-term anticoagulation not recommended by cardiology this admission.  Hypertension  - Currently on hydralazine via tube.   Leukocytosis Sinusitis - MRI brain noted progressed mastoid effusions, she has finished her treatment with Augmentin   Pneumonia due to COVID and RSV - Productive Cough, subsequently  aspiration pneumonia.  Initial treatment for this problem off of antibiotics now.  Will try and keep tube feed rate around 35  cc an hour as she tends to aspirate on higher rate of tube feed.  Abnormal C6 signal MRI showed a nonspecific bone marrow signal of c6 vertebral body - unclear if related to a malignancy vs discitis/osteomyelitis. Will hold off on C spine imaging, as family does not want to pursue this workup and priority is mental status/neuro recovery.    If no other findings, consider C spine MRI to further evaluate   Seizure disorder  - Continue home keppra 500 mg BID seen by neurology this admission.   ESRD on HD MWF - Dialysis per nephro - Discontinued renvela due to low phos levels   Anemia of chronic disease - 1 unit blood on 2/10, Hgb stable - trend CBC - Aranesp every Friday    Diabetes - Continue SSI along with long-acting insulin - Tube feeds  CBG (last 3)  Recent Labs    12/18/23 0030 12/18/23 0616 12/18/23 0734  GLUCAP 191* 174* 186*         Condition - Extremely Guarded  Family Communication  :    Updated son [phone]  in detail on 12/18/2023, wants to hold off on PEG tube till 2/29/2025 1 L.    Husband x 2 on 12/09/23, husband bedside 12/17/2023  Code Status :  Full  Consults  :  PCCM, Cards, Pall Care, Renal,     PUD Prophylaxis : PPI   Procedures  :            Disposition Plan  :    Status is: Inpatient    DVT Prophylaxis  :    SCDs Start: 11/23/23 0716 heparin injection 5,000 Units Start: 11/17/23 0930     Lab Results  Component Value Date   PLT 545 (H) 12/18/2023    Diet :  Diet Order             Diet NPO time specified  Diet effective now                    Inpatient Medications  Scheduled Meds:  arformoterol  15 mcg Nebulization BID   budesonide (PULMICORT) nebulizer solution  0.5 mg Nebulization BID   Chlorhexidine Gluconate Cloth  6 each Topical Q0600   darbepoetin (ARANESP) injection - DIALYSIS   150 mcg Subcutaneous Q Mon-1800   ferrous sulfate  300 mg Per Tube Daily   fiber supplement (BANATROL TF)  60 mL Per Tube BID   free water  100 mL Per Tube Q6H   [START ON 12/19/2023] heparin  2,500 Units Dialysis Once in dialysis   heparin injection (subcutaneous)  5,000 Units Subcutaneous Q8H   hydrALAZINE  50 mg Per Tube Q8H   insulin aspart  0-15 Units Subcutaneous Q4H   insulin aspart  4 Units Subcutaneous Q4H   insulin glargine-yfgn  5 Units Subcutaneous Daily   levETIRAcetam  500 mg Per Tube BID   multivitamin  1 tablet Per Tube QHS   nutrition supplement (JUVEN)  1 packet Per Tube BID BM   mouth rinse  15 mL Mouth Rinse Q4H   rosuvastatin  10 mg Per Tube Daily   sodium chloride flush  3 mL Intravenous Q12H   Continuous Infusions:  anticoagulant sodium citrate     feeding supplement (VITAL 1.5 CAL) 1,000 mL (12/17/23 0934)   PRN Meds:.acetaminophen **OR** acetaminophen, anticoagulant sodium citrate, docusate, [START ON 12/19/2023] heparin, ipratropium-albuterol, labetalol, ondansetron (ZOFRAN) IV, mouth rinse, polyethylene glycol, Racepinephrine HCl, white petrolatum, witch hazel-glycerin    Objective:  Vitals:   12/18/23 0200 12/18/23 0500 12/18/23 0735 12/18/23 0738  BP:      Pulse:      Resp: 20     Temp:    99.6 F (37.6 C)  TempSrc:    Axillary  SpO2:   100%   Weight:  70.9 kg    Height:        Wt Readings from Last 3 Encounters:  12/18/23 70.9 kg  11/01/23 79.5 kg  08/24/23 83 kg     Intake/Output Summary (Last 24 hours) at 12/18/2023 0950 Last data filed at 12/18/2023 0543 Gross per 24 hour  Intake 0 ml  Output 0 ml  Net 0 ml     Physical Exam  Awake but appears weak and lethargic, responds to loud verbal commands and follows some basic instructions, NG tube in place, R BKA [place].AT,PERRAL Supple Neck, No JVD,   Symmetrical Chest wall movement, Good air movement bilaterally, bilateral breath sounds RRR,No Gallops,Rubs or new Murmurs,  +ve  B.Sounds, Abd Soft, No tenderness,   No Cyanosis, Clubbing or edema     RN pressure injury documentation: Pressure Injury 12/02/23 Sacrum Medial Stage 2 -  Partial thickness loss of dermis presenting as a shallow open injury with a red, pink wound bed without slough. (Active)  12/02/23 1402  Location: Sacrum  Location Orientation: Medial  Staging: Stage 2 -  Partial thickness loss of dermis presenting as a shallow open injury with a red, pink wound bed without slough.  Wound Description (Comments):   Present on Admission: No  Dressing Type Foam - Lift dressing to assess site every shift 12/17/23 1925      Data Review:    Recent Labs  Lab 12/14/23 0326 12/15/23 0359 12/16/23 0621 12/17/23 0535 12/18/23 0539  WBC 24.7* 22.9* 23.9* 18.9* 19.7*  HGB 8.4* 8.5* 8.6* 7.7* 8.1*  HCT 24.7* 25.1* 25.6* 23.2* 23.8*  PLT 535* 523* 542* 494* 545*  MCV 90.5 91.3 92.8 93.5 93.3  MCH 30.8 30.9 31.2 31.0 31.8  MCHC 34.0 33.9 33.6 33.2 34.0  RDW 17.5* 17.7* 18.1* 18.0* 18.1*  LYMPHSABS  --  3.0 2.6 2.7 3.0  MONOABS  --  1.9* 1.6* 1.6* 2.3*  EOSABS  --  0.3 0.3 0.3 0.4  BASOSABS  --  0.1 0.1 0.1 0.1    Recent Labs  Lab 12/11/23 1620 12/12/23 0531 12/13/23 0816 12/14/23 0326 12/15/23 0359 12/16/23 0621 12/17/23 0535 12/18/23 0538 12/18/23 0539  NA 133*   < >  --  137 136 137 136  --  138  K 5.2*   < >  --  5.3* 4.8 5.4* 5.2*  --  5.5*  CL 93*   < >  --  99 96* 96* 92*  --  98  CO2 23   < >  --  27 28 30 26   --  28  ANIONGAP 17*   < >  --  11 12 11  18*  --  12  GLUCOSE 233*   < >  --  215* 186* 206* 231*  --  173*  BUN 66*   < >  --  83* 54* 85* 71*  --  94*  CREATININE 5.39*   < >  --  5.10* 3.26* 4.87* 4.46*  --  5.67*  PROCALCITON  --   --   --   --   --   --   --  1.47  --   BNP  --   --   --   --   --   --   --  138.7*  --   MG 2.2  --  2.2  --   --   --   --   --   --   CALCIUM 9.7   < >  --  9.4 9.3 9.7 10.1  --  9.8   < > = values in this interval not displayed.       Recent Labs  Lab 12/11/23 1620 12/12/23 0531 12/13/23 2130 12/14/23 0326 12/15/23 0359 12/16/23 8657 12/17/23 0535 12/18/23 0538 12/18/23 0539  PROCALCITON  --   --   --   --   --   --   --  1.47  --   BNP  --   --   --   --   --   --   --  138.7*  --   MG 2.2  --  2.2  --   --   --   --   --   --   CALCIUM 9.7   < >  --  9.4 9.3 9.7 10.1  --  9.8   < > = values in this interval not displayed.    --------------------------------------------------------------------------------------------------------------- Lab Results  Component Value Date   CHOL 90 11/17/2023   HDL 38 (L) 11/17/2023   LDLCALC 37 11/17/2023   TRIG 86 12/02/2023   CHOLHDL 2.4 11/17/2023    Lab Results  Component Value Date   HGBA1C 5.8 (H) 10/26/2023    Micro Results No results found for this or any previous visit (from the past 240 hours).  Radiology Report DG Chest Port 1 View Result Date: 12/18/2023 CLINICAL DATA:  Shortness of breath. EXAM: PORTABLE CHEST 1 VIEW COMPARISON:  Radiographs 12/16/2023 and 12/12/2023.  CT 11/16/2023. FINDINGS: 0802 hours. Feeding tube projects below the diaphragm, tip not visualized. Right IJ hemodialysis catheter projects over the inferior aspect of the right atrium, unchanged. The heart size and mediastinal contours are stable. Persistent vascular congestion with increased linear atelectasis or fluid along the right minor fissure. The overall aeration of the left lung base has mildly improved. No pneumothorax. The bones appear unchanged. There is a moderate thoracolumbar scoliosis. IMPRESSION: Persistent vascular congestion with increased linear atelectasis or fluid along the right minor fissure. Mildly improved aeration of the left lung base. Electronically Signed   By: Carey Bullocks M.D.   On: 12/18/2023 08:25     Signature  -   Susa Raring M.D on 12/18/2023 at 9:50 AM   -  To page go to [email or web address]

## 2023-12-18 NOTE — Progress Notes (Signed)
 [facility] Progress Note   Subjective:    Seen earlier today on HD.    Review of systems:  Unable to obtain secondary to AMS, not following commands   Objective Vitals:   12/18/23 1300 12/18/23 1321 12/18/23 1330 12/18/23 1340  BP: (!) 115/47 (!) 105/47 (!) 136/41   Pulse:      Resp:      Temp:  98.3 F (36.8 C)    TempSrc:  Oral    SpO2:      Weight:    70 kg  Height:       Physical Exam General: Chronically ill-appearing, awake, not speaking Heart: S1 and S2; No murmurs, gallops, or rubs Lungs: rhonchi bilat are worse than yesterday Abdomen: Soft and non-tender Extremities:No LE edema Dialysis Access: RIJ TDC   Dialysis Orders: MWF NW  4h   B350    75.5kg   2/2 bath   R fem TDC  Heparin 2600 + 1400 midrun - last HD 1/22, getting to dry wt - venofer 100 qhd thru 1/31 - mircera 150 q2, last 1/17, due 1/31  Renal-related home meds: - rocaltrol 0.5 mwf - sensipar 30 every day - hydralazine 100 tid - losartan 50 bid - renvela 800mg  ac tid - others: brilinta, crestor, keppra, ritalin, neurontin, asa, PPI, dulera, insulin, albuterol, elavil  CXR 2/18, 2/22 - small R effusion, no edema/ congestion   Assessment/Plan: Acute hypoxic respiratory failure - multifactorial with +COVID/ +RSV pna/ volume^: resolved and S/P course of abx + HD.  CVA: R then L cerebellar infarcts d/t small vessel disease. Per neurology.  On  DAPT AMS: multifactorial. Slow improvement.  Aspiration pneumonitis - noted, was moved to the ICU again for same and now back on floor. Speech is following. Swallow study noted and per team PEG is planned ESRD: on HD MWF schedule.  HTN - controlled  Anemia of esrd: Hb 8- 9 range. Last ESA on 2/17.  Redose.  Aranesp was retimed for today at 150 mcg every monday. Tsat 26% and ferritin 1638.  Getting po iron. Transfuse as needed Secondary hyperparathyroidism -  update phos/ reassess binders when taking po better H/o seizure d/o - on  Keppra PEA arrest - noted    Recent Labs  Lab 12/17/23 0535 12/18/23 0539  HGB 7.7* 8.1*  CALCIUM 10.1 9.8  CREATININE 4.46* 5.67*  K 5.2* 5.5*    Inpatient medications:  arformoterol  15 mcg Nebulization BID   aspirin  81 mg Per Tube Daily   budesonide (PULMICORT) nebulizer solution  0.5 mg Nebulization BID   Chlorhexidine Gluconate Cloth  6 each Topical Q0600   clopidogrel  75 mg Per Tube Daily   darbepoetin (ARANESP) injection - DIALYSIS  150 mcg Subcutaneous Q Mon-1800   ferrous sulfate  300 mg Per Tube Daily   fiber supplement (BANATROL TF)  60 mL Per Tube BID   free water  100 mL Per Tube Q6H   heparin injection (subcutaneous)  5,000 Units Subcutaneous Q8H   heparin sodium (porcine)  3,200 Units Intracatheter Once   hydrALAZINE  50 mg Per Tube Q8H   insulin aspart  0-15 Units Subcutaneous Q4H   insulin aspart  4 Units Subcutaneous Q4H   insulin glargine-yfgn  5 Units Subcutaneous Daily   levETIRAcetam  500 mg Per Tube BID   multivitamin  1 tablet Per Tube QHS   nutrition supplement (JUVEN)  1 packet Per Tube BID BM   mouth rinse  15 mL Mouth Rinse  Q4H   rosuvastatin  10 mg Per Tube Daily   sodium chloride flush  3 mL Intravenous Q12H    feeding supplement (NEPRO CARB STEADY) 1,000 mL (12/18/23 1448)   acetaminophen **OR** acetaminophen, docusate, ipratropium-albuterol, labetalol, ondansetron (ZOFRAN) IV, mouth rinse, polyethylene glycol, Racepinephrine HCl, white petrolatum, witch hazel-glycerin       Estanislado Emms, MD 3:28 PM 12/18/2023

## 2023-12-18 NOTE — Procedures (Signed)
 Seen and examined on dialysis.  Blood pressure 115/47 and HR 78.  Tolerating goal.  RIJ tunn catheter in place. Procedure supervised.  Does not follow commands for me.  Has taken off of her nasal cannula and tech has reapplied.    Switched TF to nepro given hyperkalemia  Estanislado Emms, MD 12/18/2023  1:03 PM

## 2023-12-18 NOTE — Plan of Care (Signed)
  Problem: Clinical Measurements: Goal: Respiratory complications will improve Outcome: Progressing   Problem: Activity: Goal: Risk for activity intolerance will decrease Outcome: Progressing   Problem: Nutrition: Goal: Adequate nutrition will be maintained Outcome: Progressing   Problem: Pain Managment: Goal: General experience of comfort will improve and/or be controlled Outcome: Progressing   Problem: Activity: Goal: Ability to tolerate increased activity will improve Outcome: Progressing   Problem: Respiratory: Goal: Ability to maintain a clear airway and adequate ventilation will improve Outcome: Progressing

## 2023-12-18 NOTE — TOC Progression Note (Signed)
 Transition of Care [facility]) - Progression Note    Patient Details  Name: Carolyn Russo MRN: [id number] Date of Birth: 06/30/1951  [facility] CM/SW Contact  Mearl Latin, LCSW Phone Number: 12/18/2023, 8:53 AM  Clinical Narrative:    CSW continuing to follow for medical stability.    Expected Discharge Plan: Skilled Nursing Facility Barriers to Discharge: Continued Medical Work up, English as a second language teacher  Expected Discharge Plan and Services In-house Referral: Clinical Social Work   Post Acute Care Choice: Skilled Nursing Facility Living arrangements for the past 2 months: Single Family Home, Skilled Nursing Facility                                       Social Determinants of Health (SDOH) Interventions SDOH Screenings   Food Insecurity: No Food Insecurity (11/17/2023)  Housing: Low Risk  (11/17/2023)  Transportation Needs: No Transportation Needs (11/17/2023)  Utilities: Not At Risk (11/17/2023)  Alcohol Screen: Low Risk  (01/09/2019)  Depression (PHQ2-9): Low Risk  (09/11/2023)  Financial Resource Strain: Low Risk  (09/02/2021)  Physical Activity: Inactive (09/02/2021)  Social Connections: Socially Integrated (11/17/2023)  Stress: Stress Concern Present (09/02/2021)  Tobacco Use: Low Risk  (11/16/2023)    Readmission Risk Interventions    12/15/2023    2:57 PM 02/14/2023    4:54 PM  Readmission Risk Prevention Plan  Transportation Screening Complete Complete  PCP or Specialist Appt within 3-5 Days  Complete  HRI or Home Care Consult  Complete  Social Work Consult for Recovery Care Planning/Counseling  Complete  Palliative Care Screening  Complete  Medication Review Oceanographer) Complete Complete  PCP or Specialist appointment within 3-5 days of discharge Complete   HRI or Home Care Consult Complete   SW Recovery Care/Counseling Consult Complete   Palliative Care Screening Complete   Skilled Nursing Facility Complete

## 2023-12-18 NOTE — Progress Notes (Signed)
 Speech Language Pathology Treatment: Dysphagia  Patient Details Name: Carolyn Russo MRN: [id number] DOB: October 18, 1951 Today's Date: 12/18/2023 Time: 6962-9528 SLP Time Calculation (min) (ACUTE ONLY): 38 min  Assessment / Plan / Recommendation Clinical Impression  Pt seen for dysphagia therapy with husband at bedside and son on speaker phone for portion of session. Pt was lethargic needing frequent stimulation to stay awake, She would open eyes with verbal and tactile stimulation for periods of time with longer periods of alertness. She tolerated oral care then given sips of thin water via spoon with mild suspected oral delay and pharyngeal swallow initiation increased with subsequent trials. No s/s aspiration however pt was a silent aspirator during the FEES last week.  Used straw to determine if able to seal lips and blow through straw which she was able therefore, SLP retrieved EMST. She needed intermittent cues to arouse and required therapist to assist manually with labial seal. Pt cued to take a deep breath however she was unable and did not make observable attempts to blow into respiratory trainer over multiple attempts with trainer set at lowest setting 5 cm H20. Left trainer in room and husband stated he would use with her when he visited. Discussed pt's current abilities and prognosis with husband and son and agreed to repeat instrumental assessment, with MBS (vs FEES) prior to receiving PEG. Issue is determining an appropriate time to evaluate when she is more alert and a non dialysis day.    HPI HPI: Pt is a 73 y.o. female with who presented from SNF on 11/16/23 for chest pain, weakness and confusion. CT head revealed infarct in inferior right cerebellum which was new since 06/12/22. MRI brain with acute right inferomedial cerebellar ischemic infarction in the right PICA territory. Pt also diagnosed with COVID/RSV. On 2/15  transferred back to ICU for atrial fibrillation with rapid ventricular  response that developed after a gross aspiration event from vomiting. MBS 1/28 DIGEST swallow severity rating of 1, mild. Recommended dys2/thin liquids. Secondary swallows needed to clear pharyngeal residue. Experienced cardiac arrest on 1/30 and was intubated until 2/9 (ten days) and remained NPO thereafter with cortrak. PMH: CVA in 2022 and 2023, right BKA, HTN, DM-2, HLD, ESRD on HD MWF, asthma, dysphagia, CHF, heart murmur, seizure.      SLP Plan  Continue with current plan of care      Recommendations for follow up therapy are one component of a multi-disciplinary discharge planning process, led by the attending physician.  Recommendations may be updated based on patient status, additional functional criteria and insurance authorization.    Recommendations  Diet recommendations: Other(comment);NPO (ice chips with full supervision) Medication Administration: Via alternative means                  Oral care QID   Frequent or constant Supervision/Assistance Dysphagia, oropharyngeal phase (R13.12)     Continue with current plan of care     Royce Macadamia  12/18/2023, 9:20 AM

## 2023-12-19 ENCOUNTER — Inpatient Hospital Stay (HOSPITAL_COMMUNITY): Payer: Medicare PPO

## 2023-12-19 DIAGNOSIS — G9340 Encephalopathy, unspecified: Secondary | ICD-10-CM | POA: Diagnosis not present

## 2023-12-19 LAB — CBC WITH DIFFERENTIAL/PLATELET
Abs Immature Granulocytes: 0.21 10*3/uL — ABNORMAL HIGH (ref 0.00–0.07)
Basophils Absolute: 0.1 10*3/uL (ref 0.0–0.1)
Basophils Relative: 0 %
Eosinophils Absolute: 0.4 10*3/uL (ref 0.0–0.5)
Eosinophils Relative: 2 %
HCT: 22.7 % — ABNORMAL LOW (ref 36.0–46.0)
Hemoglobin: 7.5 g/dL — ABNORMAL LOW (ref 12.0–15.0)
Immature Granulocytes: 1 %
Lymphocytes Relative: 15 %
Lymphs Abs: 3 10*3/uL (ref 0.7–4.0)
MCH: 31 pg (ref 26.0–34.0)
MCHC: 33 g/dL (ref 30.0–36.0)
MCV: 93.8 fL (ref 80.0–100.0)
Monocytes Absolute: 2.3 10*3/uL — ABNORMAL HIGH (ref 0.1–1.0)
Monocytes Relative: 11 %
Neutro Abs: 14.6 10*3/uL — ABNORMAL HIGH (ref 1.7–7.7)
Neutrophils Relative %: 71 %
Platelets: 533 10*3/uL — ABNORMAL HIGH (ref 150–400)
RBC: 2.42 MIL/uL — ABNORMAL LOW (ref 3.87–5.11)
RDW: 18.2 % — ABNORMAL HIGH (ref 11.5–15.5)
WBC: 20.5 10*3/uL — ABNORMAL HIGH (ref 4.0–10.5)
nRBC: 0.1 % (ref 0.0–0.2)

## 2023-12-19 LAB — RENAL FUNCTION PANEL
Albumin: 2.4 g/dL — ABNORMAL LOW (ref 3.5–5.0)
Anion gap: 9 (ref 5–15)
BUN: 61 mg/dL — ABNORMAL HIGH (ref 8–23)
CO2: 27 mmol/L (ref 22–32)
Calcium: 9.2 mg/dL (ref 8.9–10.3)
Chloride: 97 mmol/L — ABNORMAL LOW (ref 98–111)
Creatinine, Ser: 3.72 mg/dL — ABNORMAL HIGH (ref 0.44–1.00)
GFR, Estimated: 12 mL/min — ABNORMAL LOW (ref >60)
Glucose, Bld: 158 mg/dL — ABNORMAL HIGH (ref 70–99)
Phosphorus: 3.6 mg/dL (ref 2.5–4.6)
Potassium: 4.9 mmol/L (ref 3.5–5.1)
Sodium: 133 mmol/L — ABNORMAL LOW (ref 135–145)

## 2023-12-19 LAB — GLUCOSE, CAPILLARY
Glucose-Capillary: 164 mg/dL — ABNORMAL HIGH (ref 70–99)
Glucose-Capillary: 158 mg/dL — ABNORMAL HIGH (ref 70–99)
Glucose-Capillary: 165 mg/dL — ABNORMAL HIGH (ref 70–99)
Glucose-Capillary: 135 mg/dL — ABNORMAL HIGH (ref 70–99)
Glucose-Capillary: 98 mg/dL (ref 70–99)
Glucose-Capillary: 133 mg/dL — ABNORMAL HIGH (ref 70–99)
Glucose-Capillary: 104 mg/dL — ABNORMAL HIGH (ref 70–99)

## 2023-12-19 LAB — HEPATITIS B SURFACE ANTIGEN: Hepatitis B Surface Ag: NONREACTIVE

## 2023-12-19 MED ORDER — ASPIRIN 81 MG PO CHEW
81.0000 mg | CHEWABLE_TABLET | Freq: Every day | ORAL | Status: DC
  Administered 2023-12-20 – 2023-12-28 (×9): 81 mg via ORAL
  Filled 2023-12-19 (×9): qty 1

## 2023-12-19 MED ORDER — RENA-VITE PO TABS
1.0000 | ORAL_TABLET | Freq: Every day | ORAL | Status: DC
  Administered 2023-12-19 – 2023-12-28 (×9): 1 via ORAL
  Filled 2023-12-19 (×9): qty 1

## 2023-12-19 MED ORDER — FERROUS SULFATE 300 (60 FE) MG/5ML PO SOLN
300.0000 mg | Freq: Every day | ORAL | Status: DC
  Administered 2023-12-20 – 2023-12-28 (×9): 300 mg via ORAL
  Filled 2023-12-19 (×9): qty 5

## 2023-12-19 MED ORDER — INSULIN GLARGINE 100 UNIT/ML ~~LOC~~ SOLN
10.0000 [IU] | Freq: Every day | SUBCUTANEOUS | Status: DC
  Filled 2023-12-19: qty 0.1

## 2023-12-19 MED ORDER — HYDRALAZINE HCL 50 MG PO TABS
50.0000 mg | ORAL_TABLET | Freq: Three times a day (TID) | ORAL | Status: DC
  Administered 2023-12-19 – 2023-12-28 (×10): 50 mg via ORAL
  Filled 2023-12-19 (×16): qty 1

## 2023-12-19 MED ORDER — CLOPIDOGREL BISULFATE 75 MG PO TABS
75.0000 mg | ORAL_TABLET | Freq: Every day | ORAL | Status: DC
  Administered 2023-12-20 – 2023-12-28 (×9): 75 mg via ORAL
  Filled 2023-12-19 (×9): qty 1

## 2023-12-19 MED ORDER — CHLORHEXIDINE GLUCONATE CLOTH 2 % EX PADS
6.0000 | MEDICATED_PAD | Freq: Every day | CUTANEOUS | Status: DC
  Administered 2023-12-20 – 2023-12-22 (×3): 6 via TOPICAL

## 2023-12-19 MED ORDER — LEVETIRACETAM 100 MG/ML PO SOLN
500.0000 mg | Freq: Two times a day (BID) | ORAL | Status: DC
  Administered 2023-12-19 – 2023-12-28 (×18): 500 mg via ORAL
  Filled 2023-12-19 (×18): qty 5

## 2023-12-19 NOTE — Care Management Important Message (Signed)
 Important Message  Patient Details  Name: Carolyn Russo MRN: [id number] Date of Birth: 10/04/1951   Important Message Given:  Yes - Medicare IM     Dorena Bodo 12/19/2023, 1:54 PM

## 2023-12-19 NOTE — Progress Notes (Signed)
 PROGRESS NOTE                                                                                                                                                                                                             Patient Demographics:    Carolyn Russo, is a 73 y.o. female, DOB - 1951-10-24, ZOX:[id number]  Outpatient Primary MD for the patient is Annita Brod, MD    LOS - 33  Admit date - 11/16/2023    Chief Complaint  Patient presents with   Chest Pain   Dialysis pt       Brief Narrative (HPI from H&P)   73 yo female with ESRD on HD MWF, CHF, previous CVA with residual left sided weakness, T2DM, right BKA, and seizure disorder. She presented to [facility] on 1/23 with confusion, left sided chest pain, shortness of breath, and coughing. She was found to be hypoxic on room air and placed on 2 L [place]. CT chest was done without evidence of PE.  MRI revealed a 1.3cm acute to early subacute right PICA infarct. Also was admitted with pneumonia due to COVID and RSV and was started on antibiotics for likely superimposed bacterial infection.    On 1/30 early morning the patient was ~2 hrs into dialysis when her blood pressure dropped. UF goal was decreased but since the patient's level of consciousnesses decreased, she received 300 cc NS bolus. EKG changes were noted and rapid response was activated, dialysis stopped, and blood returned to the patient. Around 6:30 AM the patient was noted to be apneic without a pulse and code blue was called. Patient was noted to have PEA arrest. CPR was done for a few minutes before ROSC was achieved. She was transferred to 57M and patient was intubated due to failure to protect airway.  She continued to be tenuous and minimally responsive, finally she was extubated on 12/03/2023 transferred to my care on 12/09/2023, early in the morning she was fairly stable however within a few hours she had an episode of  emesis witnessed by her husband subsequently developed A-fib RVR and respiratory distress, cardiology PCCM again called bedside and she was transferred to ICU, she stayed there for another 5 days stabilized however still dependent on NG tube feeds, was seen by palliative care.  She was transferred to my service on 12/16/2023 again.  Overall condition report is extremely tenuous, still very high risk for aspiration, still overall extremely weak and deconditioned, family for now wants to pursue all modalities of treatment.  She is currently on NG tube, needs 5-day vacation from DAPT to get a peg tube.   Significant Hospital Events & Prcedures:    1/23 admit to [facility] for acute infarct + covid/rsv pneumonia 1/30 admit to ICU after patient had PEA arrest  2/1 Failed SBT due to apnea. ABG with respiratory alkalosis. RR decreased 2/2 Tolerating SBT. Remains encephalopathic 2/5 Continues to tolerate SBT during day, but remains encephalopathic/unable to protect airway if extubated  2/9 extubated, off precedex 2/12 still lethargic, ABG 2/11 unremarkable, limit centrally acting meds 2/15 transferred to my care, still somnolent on NG tube feeds, aspirated her vomitus, again in respiratory distress, considered back to ICU 12/14/2023 transferred by from ICU, transferred to my service on 12/16/2023   Unfortunately overall patient continues to appear quite weak and frail, still NG tube feeding dependent, very weak cough reflex, very high risk for recurrent aspiration of oral secretions and regurgitated food material, overall still bedbound, overall prognosis appears poor explained to patient's husband clearly bedside on 12/17/2023.  Called and discussed with son on 12/18/2023 as well.    Subjective:   Patient in bed, appears comfortable, denies any headache, no fever, no chest pain or pressure, no shortness of breath , no abdominal pain. No focal weakness.   Assessment  & Plan :    PEA arrest: due to hypotension  during HD, not primarily cardiac in nature, intubated for airway protection and stabilized in ICU, seen by cardiology initially, now stable from cardiac arrest standpoint.  Acute encephalopathy: secondary to post-arrest, stroke, metabolic derangements, and drug induced. Concern for cefepime toxicity given her hx of ESRD, med was discontinued 2/4. MRI brain did show a new and acute central left cerebellar infarct.  Appreciate neurology recs.  Stroke not felt to be significant enough to explain altered mental status, Seroquel and amitriptyline have been stopped, mentation gradually improving now.  Still unable to swallow safely still NG tube bound on 12/16/2023.  Aspiration on 12/09/2023 followed by acute hypoxic respiratory failure with respiratory distress and A-fib RVR.  Treated in ICU, now off of antibiotics, still on NG tube feeds, still has dysphagia, still high risk for aspiration of oral contents, peg tube was decided to be placed in the ICU however family wants to hold off till 2/29/2025 or later.  Acute to early subacute right PICA infarct on 1/24, resolved New acute central left cerebellar infarct  - Appreciate neuro follow up , on DAPT with aspirin and brilinta along with statin, note neuro was consulted as PEG tube was being planned and they are okay holding DAPT for 5 days prior to PEG tube placement, however family wants to hold off on feeding tube till 2/29/2025 or later, they want to continue NG tube feeds for now  - Needs 30 d heart monitor at discharge  Paroxysmal atrial fibrillation with question of possible underlying sick sinus syndrome.  Seen by cardiology.  Currently on oral amiodarone.  Long-term anticoagulation not recommended by cardiology this admission.  Hypertension  - Currently on hydralazine via tube.   Leukocytosis Sinusitis - MRI brain noted progressed mastoid effusions, she has finished her treatment with Augmentin   Pneumonia due to COVID and RSV - Productive Cough,  subsequently aspiration pneumonia.  Initial treatment for this problem off of antibiotics now.  Will try and keep tube feed  rate around 35 cc an hour as she tends to aspirate on higher rate of tube feed.  Abnormal C6 signal MRI showed a nonspecific bone marrow signal of c6 vertebral body - unclear if related to a malignancy vs discitis/osteomyelitis. Will hold off on C spine imaging, as family does not want to pursue this workup and priority is mental status/neuro recovery.    If no other findings, consider C spine MRI to further evaluate   Seizure disorder  - Continue home keppra 500 mg BID seen by neurology this admission.   ESRD on HD MWF - Dialysis per nephro - Discontinued renvela due to low phos levels   Anemia of chronic disease - 1 unit blood on 2/10, Hgb stable - trend CBC - Aranesp every Friday    Diabetes - Continue SSI along with long-acting insulin - Tube feeds  CBG (last 3)  Recent Labs    12/19/23 0414 12/19/23 0836 12/19/23 0854  GLUCAP 164* 158* 165*         Condition - Extremely Guarded  Family Communication  :    Updated son [phone]  in detail on 12/18/2023, wants to hold off on PEG tube till 2/29/2025   Husband x 2 on 12/09/23, husband bedside 12/17/2023  Code Status :  Full  Consults  :  PCCM, Cards, Pall Care, Renal,     PUD Prophylaxis : PPI   Procedures  :            Disposition Plan  :    Status is: Inpatient    DVT Prophylaxis  :    SCDs Start: 11/23/23 0716 heparin injection 5,000 Units Start: 11/17/23 0930     Lab Results  Component Value Date   PLT 533 (H) 12/19/2023    Diet :  Diet Order             Diet NPO time specified  Diet effective now                    Inpatient Medications  Scheduled Meds:  arformoterol  15 mcg Nebulization BID   aspirin  81 mg Per Tube Daily   budesonide (PULMICORT) nebulizer solution  0.5 mg Nebulization BID   Chlorhexidine Gluconate Cloth  6 each Topical Q0600    clopidogrel  75 mg Per Tube Daily   darbepoetin (ARANESP) injection - DIALYSIS  150 mcg Subcutaneous Q Mon-1800   ferrous sulfate  300 mg Per Tube Daily   fiber supplement (BANATROL TF)  60 mL Per Tube BID   free water  100 mL Per Tube Q6H   heparin injection (subcutaneous)  5,000 Units Subcutaneous Q8H   heparin sodium (porcine)  3,200 Units Intracatheter Once   hydrALAZINE  50 mg Per Tube Q8H   insulin aspart  0-15 Units Subcutaneous Q4H   insulin aspart  4 Units Subcutaneous Q4H   insulin glargine  5 Units Subcutaneous Daily   levETIRAcetam  500 mg Per Tube BID   multivitamin  1 tablet Per Tube QHS   nutrition supplement (JUVEN)  1 packet Per Tube BID BM   mouth rinse  15 mL Mouth Rinse Q4H   rosuvastatin  10 mg Per Tube Daily   sodium chloride flush  3 mL Intravenous Q12H   Continuous Infusions:  feeding supplement (NEPRO CARB STEADY) 1,000 mL (12/18/23 1448)   PRN Meds:.acetaminophen **OR** acetaminophen, docusate, ipratropium-albuterol, labetalol, ondansetron (ZOFRAN) IV, mouth rinse, polyethylene glycol, Racepinephrine HCl, white petrolatum, witch hazel-glycerin  Objective:   Vitals:   12/19/23 0500 12/19/23 0600 12/19/23 0800 12/19/23 0852  BP:   (!) 133/52 (!) 106/54  Pulse: 80 88 77 79  Resp: 20  (!) 27 18  Temp:    98.1 F (36.7 C)  TempSrc:    Oral  SpO2:   100% 100%  Weight: 70.9 kg     Height:        Wt Readings from Last 3 Encounters:  12/19/23 70.9 kg  11/01/23 79.5 kg  08/24/23 83 kg     Intake/Output Summary (Last 24 hours) at 12/19/2023 0933 Last data filed at 12/18/2023 1600 Gross per 24 hour  Intake 445 ml  Output 950 ml  Net -505 ml     Physical Exam  Awake slightly more alert, responds to loud verbal commands and follows some basic instructions, NG tube in place, R BKA [place].AT,PERRAL Supple Neck, No JVD,   Symmetrical Chest wall movement, Good air movement bilaterally, bilateral breath sounds RRR,No Gallops,Rubs or new Murmurs,  +ve  B.Sounds, Abd Soft, No tenderness,   No Cyanosis, Clubbing or edema     RN pressure injury documentation: Pressure Injury 12/02/23 Sacrum Medial Stage 2 -  Partial thickness loss of dermis presenting as a shallow open injury with a red, pink wound bed without slough. (Active)  12/02/23 1402  Location: Sacrum  Location Orientation: Medial  Staging: Stage 2 -  Partial thickness loss of dermis presenting as a shallow open injury with a red, pink wound bed without slough.  Wound Description (Comments):   Present on Admission: No  Dressing Type Foam - Lift dressing to assess site every shift 12/18/23 1935      Data Review:    Recent Labs  Lab 12/15/23 0359 12/16/23 0621 12/17/23 0535 12/18/23 0539 12/19/23 0543  WBC 22.9* 23.9* 18.9* 19.7* 20.5*  HGB 8.5* 8.6* 7.7* 8.1* 7.5*  HCT 25.1* 25.6* 23.2* 23.8* 22.7*  PLT 523* 542* 494* 545* 533*  MCV 91.3 92.8 93.5 93.3 93.8  MCH 30.9 31.2 31.0 31.8 31.0  MCHC 33.9 33.6 33.2 34.0 33.0  RDW 17.7* 18.1* 18.0* 18.1* 18.2*  LYMPHSABS 3.0 2.6 2.7 3.0 3.0  MONOABS 1.9* 1.6* 1.6* 2.3* 2.3*  EOSABS 0.3 0.3 0.3 0.4 0.4  BASOSABS 0.1 0.1 0.1 0.1 0.1    Recent Labs  Lab 12/13/23 0816 12/14/23 0326 12/15/23 0359 12/16/23 0621 12/17/23 0535 12/18/23 0538 12/18/23 0539 12/18/23 1114 12/19/23 0439  NA  --    < > 136 137 136  --  138  --  133*  K  --    < > 4.8 5.4* 5.2*  --  5.5*  --  4.9  CL  --    < > 96* 96* 92*  --  98  --  97*  CO2  --    < > 28 30 26   --  28  --  27  ANIONGAP  --    < > 12 11 18*  --  12  --  9  GLUCOSE  --    < > 186* 206* 231*  --  173*  --  158*  BUN  --    < > 54* 85* 71*  --  94*  --  61*  CREATININE  --    < > 3.26* 4.87* 4.46*  --  5.67*  --  3.72*  ALBUMIN  --   --   --   --   --   --   --   --  2.4*  CRP  --   --   --   --   --   --   --  1.7*  --   PROCALCITON  --   --   --   --   --  1.47  --   --   --   BNP  --   --   --   --   --  138.7*  --   --   --   MG 2.2  --   --   --   --   --   --   --    --   PHOS  --   --   --   --   --   --   --   --  3.6  CALCIUM  --    < > 9.3 9.7 10.1  --  9.8  --  9.2   < > = values in this interval not displayed.      Recent Labs  Lab 12/13/23 0816 12/14/23 0326 12/15/23 0359 12/16/23 0621 12/17/23 0535 12/18/23 0538 12/18/23 0539 12/18/23 1114 12/19/23 0439  CRP  --   --   --   --   --   --   --  1.7*  --   PROCALCITON  --   --   --   --   --  1.47  --   --   --   BNP  --   --   --   --   --  138.7*  --   --   --   MG 2.2  --   --   --   --   --   --   --   --   CALCIUM  --    < > 9.3 9.7 10.1  --  9.8  --  9.2   < > = values in this interval not displayed.    --------------------------------------------------------------------------------------------------------------- Lab Results  Component Value Date   CHOL 90 11/17/2023   HDL 38 (L) 11/17/2023   LDLCALC 37 11/17/2023   TRIG 86 12/02/2023   CHOLHDL 2.4 11/17/2023    Lab Results  Component Value Date   HGBA1C 5.8 (H) 10/26/2023    Micro Results No results found for this or any previous visit (from the past 240 hours).  Radiology Report DG Chest Port 1 View Result Date: 12/19/2023 CLINICAL DATA:  Shortness of breath EXAM: PORTABLE CHEST 1 VIEW COMPARISON:  Yesterday FINDINGS: Stable cardiomegaly. Improved aeration in the right upper lobe along the minor fissure. Retrocardiac density attributed atelectasis and underpenetration by prior CT. No effusion or pneumothorax seen. Dialysis catheter with tip at the right atrium. Feeding tube which at least reaches the stomach. IMPRESSION: Improved aeration on the right.  No new finding. Electronically Signed   By: Tiburcio Pea M.D.   On: 12/19/2023 07:14   DG Chest Port 1 View Result Date: 12/18/2023 CLINICAL DATA:  Shortness of breath. EXAM: PORTABLE CHEST 1 VIEW COMPARISON:  Radiographs 12/16/2023 and 12/12/2023.  CT 11/16/2023. FINDINGS: 0802 hours. Feeding tube projects below the diaphragm, tip not visualized. Right IJ  hemodialysis catheter projects over the inferior aspect of the right atrium, unchanged. The heart size and mediastinal contours are stable. Persistent vascular congestion with increased linear atelectasis or fluid along the right minor fissure. The overall aeration of the left lung base has mildly improved. No pneumothorax. The bones appear unchanged. There is a moderate thoracolumbar scoliosis. IMPRESSION:  Persistent vascular congestion with increased linear atelectasis or fluid along the right minor fissure. Mildly improved aeration of the left lung base. Electronically Signed   By: Carey Bullocks M.D.   On: 12/18/2023 08:25     Signature  -   Susa Raring M.D on 12/19/2023 at 9:33 AM   -  To page go to [email or web address]

## 2023-12-19 NOTE — Plan of Care (Signed)
 Problem: Coping: Goal: Ability to adjust to condition or change in health will improve Outcome: Not Progressing   Problem: Fluid Volume: Goal: Ability to maintain a balanced intake and output will improve Outcome: Not Progressing   Problem: Health Behavior/Discharge Planning: Goal: Ability to identify and utilize available resources and services will improve Outcome: Not Progressing Goal: Ability to manage health-related needs will improve Outcome: Not Progressing   Problem: Metabolic: Goal: Ability to maintain appropriate glucose levels will improve Outcome: Not Progressing   Problem: Nutritional: Goal: Maintenance of adequate nutrition will improve Outcome: Not Progressing Goal: Progress toward achieving an optimal weight will improve Outcome: Not Progressing   Problem: Skin Integrity: Goal: Risk for impaired skin integrity will decrease Outcome: Not Progressing   Problem: Education: Goal: Knowledge of disease or condition will improve Outcome: Not Progressing Goal: Knowledge of secondary prevention will improve (MUST DOCUMENT ALL) Outcome: Not Progressing Goal: Knowledge of patient specific risk factors will improve (DELETE if not current risk factor) Outcome: Not Progressing   Problem: Ischemic Stroke/TIA Tissue Perfusion: Goal: Complications of ischemic stroke/TIA will be minimized Outcome: Not Progressing   Problem: Coping: Goal: Will verbalize positive feelings about self Outcome: Not Progressing Goal: Will identify appropriate support needs Outcome: Not Progressing   Problem: Health Behavior/Discharge Planning: Goal: Ability to manage health-related needs will improve Outcome: Not Progressing Goal: Goals will be collaboratively established with patient/family Outcome: Not Progressing   Problem: Self-Care: Goal: Ability to participate in self-care as condition permits will improve Outcome: Not Progressing Goal: Verbalization of feelings and concerns  over difficulty with self-care will improve Outcome: Not Progressing Goal: Ability to communicate needs accurately will improve Outcome: Not Progressing   Problem: Nutrition: Goal: Dietary intake will improve Outcome: Not Progressing   Problem: Education: Goal: Knowledge of risk factors and measures for prevention of condition will improve Outcome: Not Progressing   Problem: Coping: Goal: Psychosocial and spiritual needs will be supported Outcome: Not Progressing   Problem: Respiratory: Goal: Will maintain a patent airway Outcome: Not Progressing Goal: Complications related to the disease process, condition or treatment will be avoided or minimized Outcome: Not Progressing   Problem: Education: Goal: Knowledge of General Education information will improve Description: Including pain rating scale, medication(s)/side effects and non-pharmacologic comfort measures Outcome: Not Progressing   Problem: Health Behavior/Discharge Planning: Goal: Ability to manage health-related needs will improve Outcome: Not Progressing   Problem: Clinical Measurements: Goal: Ability to maintain clinical measurements within normal limits will improve Outcome: Not Progressing Goal: Will remain free from infection Outcome: Not Progressing Goal: Diagnostic test results will improve Outcome: Not Progressing Goal: Respiratory complications will improve Outcome: Not Progressing Goal: Cardiovascular complication will be avoided Outcome: Not Progressing   Problem: Activity: Goal: Risk for activity intolerance will decrease Outcome: Not Progressing   Problem: Nutrition: Goal: Adequate nutrition will be maintained Outcome: Not Progressing   Problem: Coping: Goal: Level of anxiety will decrease Outcome: Not Progressing   Problem: Elimination: Goal: Will not experience complications related to bowel motility Outcome: Not Progressing Goal: Will not experience complications related to urinary  retention Outcome: Not Progressing   Problem: Pain Managment: Goal: General experience of comfort will improve and/or be controlled Outcome: Not Progressing   Problem: Safety: Goal: Ability to remain free from injury will improve Outcome: Not Progressing   Problem: Skin Integrity: Goal: Risk for impaired skin integrity will decrease Outcome: Not Progressing   Problem: Activity: Goal: Ability to tolerate increased activity will improve Outcome: Not Progressing   Problem: Respiratory: Goal:  Ability to maintain a clear airway and adequate ventilation will improve Outcome: Not Progressing   Problem: Role Relationship: Goal: Method of communication will improve Outcome: Not Progressing

## 2023-12-19 NOTE — Progress Notes (Signed)
 Nutrition Follow-up  DOCUMENTATION CODES:   Not applicable  INTERVENTION:   Advance diet as medically applicable Continue EN support of: Initiate tube feeding via cortrak: Nepro at 35 ml/h (840 ml per day)  Provides 1428 kcal, 68 gm protein, 1010 ml free water daily     NUTRITION DIAGNOSIS:   Inadequate oral intake related to acute illness as evidenced by NPO status.    GOAL:   Patient will meet greater than or equal to 90% of their needs    MONITOR:   TF tolerance, I & O's, Labs, Weight trends  REASON FOR ASSESSMENT:   Ventilator    ASSESSMENT:   Pt  presented 1/23 with confusion and worsening L chest pain secondary to COVID and RSV infection. PMH significant for ESRD on HD, CHF, CVA with residual L sided weakness, T2DM, R BKA, seizure disorder.  No family at bedside at time of visit. Patient appears frail and weak. SLP, Recommended diet advancement.  Will continue with TF till oral intake improves.   1/23 admit to [facility] for acute infarct + covid/rsv pneumonia 1/30 admit to ICU after patient had PEA arrest  2/1 Failed SBT due to apnea. ABG with respiratory alkalosis. RR decreased 2/2 Tolerating SBT. Remains encephalopathic 2/5 Continues to tolerate SBT during day, but remains encephalopathic/unable to protect airway if extubated  2/9 extubated, off precedex 2/12 still lethargic, ABG 2/11 unremarkable, limit centrally acting meds 2/15 transferred to my care, still somnolent on NG tube feeds, aspirated her vomitus, again in respiratory distress, considered back to ICU 12/14/2023 transferred by from ICU, transferred to my service on 12/16/2023 Hospital weight history: 12/19/23 0500 70.9 kg 156.31 lbs  12/18/23 1340 70 kg 154.32 lbs  12/18/23 1044 70.9 kg 156.31 lbs  12/18/23 0500 70.9 kg 156.31 lbs  12/17/23 0500 70.5 kg 155.42 lbs  12/16/23 0500 72.2 kg 159.17 lbs  12/15/23 2156 72.2 kg 159.17 lbs  12/15/23 0500 69.3 kg 152.78 lbs  12/14/23 1201 71.3 kg 157.19  lbs  12/14/23 0332 72.4 kg 159.61 lbs  12/13/23 0500 71.1 kg 156.75 lbs  12/12/23 0740 74 kg 163.14 lbs  12/12/23 0500 74 kg 163.14 lbs  12/11/23 0500 73.6 kg 162.26 lbs  12/10/23 1500 72.6 kg 160.05 lbs  12/09/23 1231 51.4 kg 113.32 lbs  12/09/23 0437 74 kg 163.14 lbs  12/08/23 1838 70.5 kg 155.42 lbs  12/08/23 1341 72 kg 158.73 lbs  12/08/23 0500 72 kg 158.73 lbs  12/07/23 0500 72 kg 158.73 lbs  12/06/23 0945 72 kg 158.73 lbs  12/05/23 0152 73.1 kg 161.16 lbs  12/04/23 1330 73.8 kg 162.7 lbs  12/04/23 0917 71.8 kg 158.29 lbs  12/03/23 0400 71.8 kg 158.29 lbs  12/02/23 0509 71.8 kg 158.29 lbs  12/01/23 0500 70.6 kg 155.65 lbs  11/30/23 1228 70.6 kg 155.65 lbs  11/30/23 0839 70.6 kg 155.65 lbs  11/30/23 0500 70.6 kg 155.65 lbs  11/29/23 0500 70 kg 154.32 lbs  11/28/23 1409 70 kg 154.32 lbs  11/28/23 1039 71 kg 156.53 lbs  11/28/23 0431 71 kg 156.53 lbs  11/27/23 0500 69.7 kg 153.66 lbs  11/26/23 0533 73 kg 160.94 lbs  11/26/23 0151 73.9 kg 162.92 lbs  11/20/23 1900 76.8 kg 169.31 lbs  11/20/23 1500 79.5 kg 175.27 lbs  11/18/23 0500 79.5 kg 175.27 lbs  11/17/23 1900 79.5 kg 175.27 lbs      Average Meal Intake: Diet advanced today  Nutritionally Relevant Medications: Scheduled Meds:  arformoterol  15 mcg Nebulization BID   clopidogrel  75 mg Per Tube Daily   darbepoetin (ARANESP) injection - DIALYSIS  150 mcg Subcutaneous Q Mon-1800   ferrous sulfate  300 mg Per Tube Daily   fiber supplement (BANATROL TF)  60 mL Per Tube BID   free water  100 mL Per Tube Q6H   hydrALAZINE  50 mg Per Tube Q8H   insulin aspart  0-15 Units Subcutaneous Q4H   insulin aspart  4 Units Subcutaneous Q4H   multivitamin  1 tablet Per Tube QHS   nutrition supplement (JUVEN)  1 packet Per Tube BID BM    Continuous Infusions:  feeding supplement (NEPRO CARB STEADY) 1,000 mL (12/18/23 1448)   PRN Meds:.acetaminophen **OR** acetaminophen, docusate, ipratropium-albuterol, labetalol,  ondansetron (ZOFRAN) IV, mouth rinse, polyethylene glycol, Racepinephrine HCl, white petrolatum, witch hazel-glycerin  Labs Reviewed    NUTRITION - FOCUSED PHYSICAL EXAM:  Flowsheet Row Most Recent Value  Orbital Region No depletion  Upper Arm Region No depletion  Thoracic and Lumbar Region Unable to assess  Buccal Region Unable to assess  Temple Region No depletion  Clavicle Bone Region Mild depletion  Clavicle and Acromion Bone Region No depletion  Scapular Bone Region Mild depletion  Dorsal Hand Unable to assess  Patellar Region Moderate depletion  Anterior Thigh Region Moderate depletion  [R BKA]  Posterior Calf Region Moderate depletion  Edema (RD Assessment) None  Hair Reviewed  Eyes Reviewed  Mouth Unable to assess  Skin Reviewed  Nails Unable to assess       Diet Order:   Diet Order             DIET - DYS 1 Room service appropriate? No; Fluid consistency: Honey Thick  Diet effective now                   EDUCATION NEEDS:   No education needs have been identified at this time  Skin:  Skin Assessment: Reviewed RN Assessment Skin Integrity Issues:: Stage II Stage II: medial sacrum  Last BM:  12/18/23  Height:   Ht Readings from Last 1 Encounters:  11/28/23 5\' 3"  (1.6 m)    Weight:   Wt Readings from Last 1 Encounters:  12/19/23 70.9 kg    Ideal Body Weight:  52.3 kg  BMI:  Body mass index is 27.69 kg/m.  Estimated Nutritional Needs:   Kcal:  1600-1800 kcal/d  Protein:  80-100 g/d  Fluid:  1L + UOP    Jamelle Haring RDN, LDN Clinical Dietitian   If unable to reach, please contact "RD Inpatient" secure chat group between 8 am-4 pm daily"

## 2023-12-19 NOTE — Progress Notes (Signed)
 Occupational Therapy Treatment Patient Details Name: Carolyn Russo MRN: [id number] DOB: 1950-11-29 Today's Date: 12/19/2023   History of present illness Pt is a 73 yo female presenting to [facility] on 11/16/23 for chest pain, weakness, and confusion. CT head revealed infarct in the inferior right cerebellum. MRI brain with acute right inferomedial cerebellar ischemic infarction in the right PICA territory. Pt also diagnosed with Covid/RSV. PEA arrest 1/30. New L cerebellar infarct found on MRI 2/5. Extubated 2/9. PMH: R BKA on 7/26, DM2, HTN, HLD, ESRD on HD MWF, hx of CVA 03/2021 & 05/2022, asthma, CHF, dysphagia, heart murmur, seizure   OT comments  Pt progressing toward goals this session, pt needing min- total A for seated ADLs, max -total A +2 for bed mobility and max-total A +2 for standing x2 from EOB. Pt able to sit EOB x10 mins for grooming tasks. Pt with L inattention, tracks to L minimally during session and only with max cues, does not look to LUE while rubbing in lotion on L arm. Pt with no AROM of LUE, noted to keep LUE in fist position, but able to extend wrist/digits to open hand during PROM. Will continue to monitor need for resting hand splint. Pt presenting with impairment listed below, will follow acutely. Patient will benefit from continued inpatient follow up therapy, <3 hours/day to maximize safety/ind with ADL/functional mobility.       If plan is discharge home, recommend the following:  A lot of help with bathing/dressing/bathroom;Two people to help with walking and/or transfers;Assistance with cooking/housework;Direct supervision/assist for medications management;Assistance with feeding;Direct supervision/assist for financial management;Assist for transportation;Help with stairs or ramp for entrance   Equipment Recommendations  Hoyer lift;Hospital bed    Recommendations for Other Services      Precautions / Restrictions Precautions Precautions: Fall Recall of  Precautions/Restrictions: Impaired Precaution/Restrictions Comments: Monitor O2/RR, cortrak, L hemi Required Braces or Orthoses: Other Brace Other Brace: RLE prosthetic Restrictions Weight Bearing Restrictions Per Provider Order: No Other Position/Activity Restrictions: R BKA- prosthetic in room       Mobility Bed Mobility Overal bed mobility: Needs Assistance Bed Mobility: Supine to Sit     Supine to sit: Max assist, Total assist, +2 for physical assistance Sit to supine: Max assist, Total assist, +2 for physical assistance        Transfers Overall transfer level: Needs assistance Equipment used: 2 person hand held assist Transfers: Sit to/from Stand Sit to Stand: Max assist, Total assist, +2 physical assistance           General transfer comment: no chair alarm pads or maximove pads in supply closet, OOB to chair deferred     Balance Overall balance assessment: Needs assistance Sitting-balance support: Single extremity supported, Feet supported Sitting balance-Leahy Scale: Poor Sitting balance - Comments: mod A at most, but overall min A at EOB Postural control: Posterior lean Standing balance support: During functional activity, Bilateral upper extremity supported Standing balance-Leahy Scale: Zero Standing balance comment: totalA                           ADL either performed or assessed with clinical judgement   ADL Overall ADL's : Needs assistance/impaired     Grooming: Wash/dry face;Sitting;Minimal assistance Grooming Details (indicate cue type and reason): min A and tactile cues for thoroughness Upper Body Bathing: Moderate assistance Upper Body Bathing Details (indicate cue type and reason): applying lotion to LUE     Upper Body Dressing : Moderate  assistance Upper Body Dressing Details (indicate cue type and reason): donning/doffing gown Lower Body Dressing: Total assistance;Sitting/lateral leans Lower Body Dressing Details (indicate cue  type and reason): to don prosthesis and L shoe.     Toileting- Clothing Manipulation and Hygiene: Total assistance Toileting - Clothing Manipulation Details (indicate cue type and reason): flexiseal     Functional mobility during ADLs: Maximal assistance;Total assistance;+2 for physical assistance      Extremity/Trunk Assessment Upper Extremity Assessment Upper Extremity Assessment: Right hand dominant RUE Deficits / Details: ROM WFL, able to grasp wash cloth to wash face with RUE LUE Deficits / Details: no AROM noted. Pt noted to have L hand clenched in fist but able to be stretched, washed, dried and, extended hand, placed on pillow with towel trough to maintain open hand LUE Sensation: decreased light touch LUE Coordination: decreased fine motor;decreased gross motor   Lower Extremity Assessment Lower Extremity Assessment: Defer to PT evaluation        Vision   Vision Assessment?: Vision impaired- to be further tested in functional context Additional Comments: decr tracking to L, especially with fatigue   Perception Perception Perception: Impaired Preception Impairment Details: Inattention/Neglect Perception-Other Comments: L inattention   Praxis Praxis Praxis: Not tested   Communication Communication Communication: Impaired Factors Affecting Communication: Difficulty expressing self   Cognition Arousal: Lethargic, Alert Behavior During Therapy: Flat affect Cognition: Difficult to assess Difficult to assess due to: Impaired communication           OT - Cognition Comments: says short words including " yes" " ouch" and "ok"during session                 Following commands: Impaired Following commands impaired: Follows one step commands inconsistently      Cueing   Cueing Techniques: Verbal cues, Gestural cues, Tactile cues, Visual cues  Exercises Other Exercises Other Exercises: PROM LUE to shoulder, elbow, wrist, hand    Shoulder Instructions        General Comments VSS on supplemental O2    Pertinent Vitals/ Pain       Pain Assessment Pain Assessment: No/denies pain  Home Living                                          Prior Functioning/Environment              Frequency  Min 1X/week        Progress Toward Goals  OT Goals(current goals can now be found in the care plan section)  Progress towards OT goals: Progressing toward goals  Acute Rehab OT Goals Patient Stated Goal: none stated OT Goal Formulation: Patient unable to participate in goal setting Time For Goal Achievement: 12/21/23 Potential to Achieve Goals: Fair ADL Goals Pt Will Perform Grooming: with mod assist;bed level Pt Will Perform Lower Body Bathing: sitting/lateral leans;with min assist Pt Will Perform Upper Body Dressing: with min assist;sitting Pt Will Perform Lower Body Dressing: with mod assist;sitting/lateral leans Pt Will Transfer to Toilet: with min assist;with +2 assist;stand pivot transfer;bedside commode Pt/caregiver will Perform Home Exercise Program: Increased strength;Increased ROM;Both right and left upper extremity;With written HEP provided Additional ADL Goal #1: Pt to follow one step commands > 50% of the time Additional ADL Goal #2: Pt to maintain sitting balance EOB > 2 min with no more than Mod A during functional tasks  Plan  Co-evaluation    PT/OT/SLP Co-Evaluation/Treatment: Yes Reason for Co-Treatment: For patient/therapist safety;Complexity of the patient's impairments (multi-system involvement);Necessary to address cognition/behavior during functional activity   OT goals addressed during session: ADL's and self-care;Strengthening/ROM      AM-PAC OT "6 Clicks" Daily Activity     Outcome Measure   Help from another person eating meals?: Total Help from another person taking care of personal grooming?: A Lot Help from another person toileting, which includes using toliet, bedpan, or  urinal?: Total Help from another person bathing (including washing, rinsing, drying)?: A Lot Help from another person to put on and taking off regular upper body clothing?: A Lot Help from another person to put on and taking off regular lower body clothing?: Total 6 Click Score: 9    End of Session Equipment Utilized During Treatment: Oxygen  OT Visit Diagnosis: Muscle weakness (generalized) (M62.81);Other symptoms and signs involving cognitive function;Pain;Other abnormalities of gait and mobility (R26.89);Unsteadiness on feet (R26.81);Other (comment) Hemiplegia - Right/Left: Left Hemiplegia - dominant/non-dominant: Non-Dominant Hemiplegia - caused by: Cerebral infarction   Activity Tolerance Patient limited by fatigue;Patient tolerated treatment well   Patient Left in bed;with call bell/phone within reach;with bed alarm set   Nurse Communication Mobility status        Time: 8295-6213 OT Time Calculation (min): 30 min  Charges: OT General Charges $OT Visit: 1 Visit OT Treatments $Self Care/Home Management : 8-22 mins  Carver Fila, OTD, OTR/L SecureChat Preferred Acute Rehab [phone]    Saesha Llerenas K Koonce 12/19/2023, 4:20 PM

## 2023-12-19 NOTE — Plan of Care (Signed)
  Problem: Clinical Measurements: Goal: Respiratory complications will improve Outcome: Progressing Goal: Cardiovascular complication will be avoided Outcome: Progressing   Problem: Activity: Goal: Risk for activity intolerance will decrease Outcome: Progressing   Problem: Nutrition: Goal: Adequate nutrition will be maintained Outcome: Progressing   Problem: Elimination: Goal: Will not experience complications related to bowel motility Outcome: Progressing   Problem: Pain Managment: Goal: General experience of comfort will improve and/or be controlled Outcome: Progressing   Problem: Respiratory: Goal: Ability to maintain a clear airway and adequate ventilation will improve Outcome: Progressing   Problem: Role Relationship: Goal: Method of communication will improve Outcome: Progressing

## 2023-12-19 NOTE — Progress Notes (Signed)
 Physical Therapy Treatment Patient Details Name: Carolyn Russo MRN: [id number] DOB: 25-Sep-1951 Today's Date: 12/19/2023   History of Present Illness Pt is a 73 yo female presenting to [facility] on 11/16/23 for chest pain, weakness, and confusion. CT head revealed infarct in the inferior right cerebellum. MRI brain with acute right inferomedial cerebellar ischemic infarction in the right PICA territory. Pt also diagnosed with Covid/RSV. PEA arrest 1/30. New L cerebellar infarct found on MRI 2/5. Extubated 2/9. PMH: R BKA on 7/26, DM2, HTN, HLD, ESRD on HD MWF, hx of CVA 03/2021 & 05/2022, asthma, CHF, dysphagia, heart murmur, seizure    PT Comments  Pt demonstrating continued progress towards acute goals this session. Seen with OT to maximize functional mobility progression and OOB transfers. Pt requiring max-total A +2 to complete bed mobility and max A +2 to boost to stand x3 during session. Pt requiring varying levels of assist to maintain sitting balance, CGA with static sitting and up to mod A to maintain when engaged in task due to posterior lean. Pt continues to be limited in safe mobility by global weakness, poor balance/postural reactions and decreased activity tolerance. Pt continues to benefit from skilled PT services to progress toward functional mobility goals.     If plan is discharge home, recommend the following: Two people to help with walking and/or transfers;Two people to help with bathing/dressing/bathroom;Assistance with cooking/housework;Direct supervision/assist for medications management;Help with stairs or ramp for entrance   Can travel by private vehicle     No  Equipment Recommendations  Other (comment)    Recommendations for Other Services       Precautions / Restrictions Precautions Precautions: Fall Recall of Precautions/Restrictions: Impaired Precaution/Restrictions Comments: Monitor O2/RR, cortrak, L hemi Required Braces or Orthoses: Other Brace Other Brace: RLE  prosthetic Restrictions Weight Bearing Restrictions Per Provider Order: No Other Position/Activity Restrictions: R BKA- prosthetic in room     Mobility  Bed Mobility Overal bed mobility: Needs Assistance Bed Mobility: Supine to Sit     Supine to sit: Max assist, Total assist, +2 for physical assistance Sit to supine: Max assist, Total assist, +2 for physical assistance   General bed mobility comments: pt able to pull up with RUE to initiate supine to sit    Transfers Overall transfer level: Needs assistance Equipment used: 2 person hand held assist Transfers: Sit to/from Stand Sit to Stand: Max assist, Total assist, +2 physical assistance           General transfer comment: no chair alarm pads or maximove pads in supply closet, OOB to chair deferred, max A to boost to stand with flexed posture needing max cues to attempt to correct    Ambulation/Gait                   Stairs             Wheelchair Mobility     Tilt Bed    Modified Rankin (Stroke Patients Only)       Balance Overall balance assessment: Needs assistance Sitting-balance support: Single extremity supported, Feet supported Sitting balance-Leahy Scale: Poor Sitting balance - Comments: mod A at most, but overall min A at EOB Postural control: Posterior lean Standing balance support: During functional activity, Bilateral upper extremity supported Standing balance-Leahy Scale: Zero Standing balance comment: totalA                            Communication Communication Communication: Impaired Factors  Affecting Communication: Difficulty expressing self  Cognition Arousal: Lethargic, Alert Behavior During Therapy: Flat affect                             Following commands: Impaired Following commands impaired: Follows one step commands inconsistently    Cueing Cueing Techniques: Verbal cues, Gestural cues, Tactile cues, Visual cues  Exercises Other  Exercises Other Exercises: PROM LUE to shoulder, elbow, wrist, hand    General Comments General comments (skin integrity, edema, etc.): VSS on supplemental O2      Pertinent Vitals/Pain Pain Assessment Pain Assessment: No/denies pain    Home Living                          Prior Function            PT Goals (current goals can now be found in the care plan section) Acute Rehab PT Goals PT Goal Formulation: Patient unable to participate in goal setting Time For Goal Achievement: 12/29/23 Progress towards PT goals: Progressing toward goals    Frequency    Min 1X/week      PT Plan      Co-evaluation PT/OT/SLP Co-Evaluation/Treatment: Yes Reason for Co-Treatment: For patient/therapist safety;Complexity of the patient's impairments (multi-system involvement);Necessary to address cognition/behavior during functional activity PT goals addressed during session: Mobility/safety with mobility;Balance OT goals addressed during session: ADL's and self-care;Strengthening/ROM      AM-PAC PT "6 Clicks" Mobility   Outcome Measure  Help needed turning from your back to your side while in a flat bed without using bedrails?: Total Help needed moving from lying on your back to sitting on the side of a flat bed without using bedrails?: Total Help needed moving to and from a bed to a chair (including a wheelchair)?: Total Help needed standing up from a chair using your arms (e.g., wheelchair or bedside chair)?: Total Help needed to walk in hospital room?: Total Help needed climbing 3-5 steps with a railing? : Total 6 Click Score: 6    End of Session Equipment Utilized During Treatment: Gait belt;Oxygen Activity Tolerance: Patient tolerated treatment well Patient left: with call bell/phone within reach;in bed;with bed alarm set Nurse Communication: Mobility status;Need for lift equipment PT Visit Diagnosis: Muscle weakness (generalized) (M62.81);Difficulty in walking, not  elsewhere classified (R26.2);Other symptoms and signs involving the nervous system (R29.898);Hemiplegia and hemiparesis Hemiplegia - Right/Left: Left Hemiplegia - caused by: Cerebral infarction     Time: 5366-4403 PT Time Calculation (min) (ACUTE ONLY): 30 min  Charges:    $Therapeutic Activity: 8-22 mins PT General Charges $$ ACUTE PT VISIT: 1 Visit                     Candida Vetter R. PTA Acute Rehabilitation Services Office: [phone]   Catalina Antigua 12/19/2023, 4:23 PM

## 2023-12-19 NOTE — Progress Notes (Signed)
 [facility] Progress Note   Subjective:    Last HD on 2/24 with 1 kg UF.  Transport has arrived to take her down to a swallow study.  Mostly nonverbal for me as below  Review of systems:   Unable to obtain secondary to AMS, not following commands consistently  Objective Vitals:   12/19/23 0500 12/19/23 0600 12/19/23 0800 12/19/23 0852  BP:   (!) 133/52 (!) 106/54  Pulse: 80 88 77 79  Resp: 20  (!) 27 18  Temp:    98.1 F (36.7 C)  TempSrc:    Oral  SpO2:   100% 100%  Weight: 70.9 kg     Height:       Physical Exam  General elderly female chronically ill appearing; in bed in no acute distress HEENT normocephalic atraumatic extraocular movements intact sclera anicteric Neck supple trachea midline Lungs occ rhonchi to auscultation bilaterally normal work of breathing at rest on 3 liters oxygen Heart S1S2 no rub Abdomen soft nontender nondistended Extremities no edema appreciated; right BKA  Psych normal mood and affect Neuro - mostly nonverbal; answers to questions are delayed; she does provide her first name, not the year Access RIJ tunn catheter; right AVF with bruit and thrill     Dialysis Orders: MWF [facility]  4h   B350    75.5kg   2/2 bath   R fem TDC  Heparin 2600 + 1400 midrun - last HD 1/22, getting to dry wt - venofer 100 qhd thru 1/31 - mircera 150 q2, last 1/17, due 1/31  Renal-related home meds: - rocaltrol 0.5 mwf - sensipar 30 every day - hydralazine 100 tid - losartan 50 bid - renvela 800mg  ac tid - others: brilinta, crestor, keppra, ritalin, neurontin, asa, PPI, dulera, insulin, albuterol, elavil  CXR 2/18, 2/22 - small R effusion, no edema/ congestion   Assessment/Plan: Acute hypoxic respiratory failure - multifactorial with +COVID/ +RSV pna/ volume^: resolved and S/P course of abx + HD.  CVA: R then L cerebellar infarcts d/t small vessel disease. Per neurology.  On aspirin and plavix for now (would need to be held prior to PEG per  charting) AMS: multifactorial. Slow improvement per charting.  Aspiration pneumonitis - noted, was moved to the ICU again for same and now back on floor. Speech is following and has a swallow study today  ESRD: on HD MWF schedule.  HTN - controlled  Anemia of esrd: Last ESA on 2/17.  Aranesp 150 mcg every monday. Tsat 26% and ferritin 1638.  Getting po iron. Transfuse as needed Secondary hyperparathyroidism -  phos ok.  Reassess binders when taking po better   H/o seizure d/o - on Keppra PEA arrest - noted recent complicated history    Recent Labs  Lab 12/18/23 0539 12/19/23 0439 12/19/23 0543  HGB 8.1*  --  7.5*  ALBUMIN  --  2.4*  --   CALCIUM 9.8 9.2  --   PHOS  --  3.6  --   CREATININE 5.67* 3.72*  --   K 5.5* 4.9  --     Inpatient medications:  arformoterol  15 mcg Nebulization BID   aspirin  81 mg Per Tube Daily   budesonide (PULMICORT) nebulizer solution  0.5 mg Nebulization BID   Chlorhexidine Gluconate Cloth  6 each Topical Q0600   clopidogrel  75 mg Per Tube Daily   darbepoetin (ARANESP) injection - DIALYSIS  150 mcg Subcutaneous Q Mon-1800   ferrous sulfate  300 mg Per  Tube Daily   fiber supplement (BANATROL TF)  60 mL Per Tube BID   free water  100 mL Per Tube Q6H   heparin injection (subcutaneous)  5,000 Units Subcutaneous Q8H   heparin sodium (porcine)  3,200 Units Intracatheter Once   hydrALAZINE  50 mg Per Tube Q8H   insulin aspart  0-15 Units Subcutaneous Q4H   insulin aspart  4 Units Subcutaneous Q4H   insulin glargine  5 Units Subcutaneous Daily   levETIRAcetam  500 mg Per Tube BID   multivitamin  1 tablet Per Tube QHS   nutrition supplement (JUVEN)  1 packet Per Tube BID BM   mouth rinse  15 mL Mouth Rinse Q4H   rosuvastatin  10 mg Per Tube Daily   sodium chloride flush  3 mL Intravenous Q12H    feeding supplement (NEPRO CARB STEADY) 1,000 mL (12/18/23 1448)   acetaminophen **OR** acetaminophen, docusate, ipratropium-albuterol, labetalol,  ondansetron (ZOFRAN) IV, mouth rinse, polyethylene glycol, Racepinephrine HCl, white petrolatum, witch hazel-glycerin       Estanislado Emms, MD 11:54 AM 12/19/2023

## 2023-12-19 NOTE — Progress Notes (Signed)
 Modified Barium Swallow Study  Patient Details  Name: RITAJ DULLEA MRN: [id number] Date of Birth: 08-24-1951  Today's Date: 12/19/2023  Modified Barium Swallow completed.  Full report located under Chart Review in the Imaging Section.  History of Present Illness Pt is a 73 y.o. female with who presented from [facility] on 11[facility] for chest pain, weakness and confusion. CT head revealed infarct in inferior right cerebellum which was new since 06/12/22. MRI brain with acute right inferomedial cerebellar ischemic infarction in the right PICA territory. Pt also diagnosed with COVID/RSV. On 2/15  transferred back to ICU for atrial fibrillation with rapid ventricular response that developed after a gross aspiration event from vomiting. MBS 1/28 DIGEST swallow severity rating of 1, mild. Recommended dys2/thin liquids. Secondary swallows needed to clear pharyngeal residue. Experienced cardiac arrest on 1/30 and was intubated until 2/9 (ten days) and remained NPO thereafter with cortrak. PMH: CVA in 2022 and 2023, right BKA, HTN, DM-2, HLD, ESRD on HD MWF, asthma, dysphagia, CHF, heart murmur, seizure. Repeat instrumental assessment with MBS.   Clinical Impression Pt demonstrated improved swallow function compared to FEES on 2/18 and was adequately awake. Oral impairments include slow lingual transport and mild-mod lingual surface and tongue base residue that spills to to pyriform sinuses followed by spontaneous swallow which cleared. At end of study she had mild vallecular residue with honey thick that cleared with cued second swallow. Pt exhibits decreased timing with one episode of  teaspoon honey thick penetrating before the swallow and aspirated during with strong reflexive cough. Penetrates cleared however shoulder obscuring view of trachea. Residue penetrated x 1 with honey thick (PAS 5) out of multiple trials during the swallow. Teaspoon nectar thick also penetrated prior to laryngeal closure and aspirated  during the swallow with a strong reflexive delayed cough (could not detect if ejected from trachea with shoulder obstruction however cleared the vestibule). Solid and thin liquids were not administered and order of bolus presentation of MBS-Imp not followed this study. She was given a DIGEST score of 2 (moderate impairment). Recommend pt initiate a Dys 1 (puree), honey thick liquid given by 1/2 teaspoon amounts only when adequately awake,  second swallow and pt will require full assist/supervision with meals. ST will continue to follow for safety and efficiency. Results relayed to MD and pt's husband via phone. Factors that may increase risk of adverse event in presence of aspiration Rubye Oaks & Clearance Coots 2021): Reduced cognitive function  DIGEST Swallow Severity Rating*  Safety:   Efficiency:  Overall Pharyngeal Swallow Severity:  1: mild; 2: moderate; 3: severe; 4: profound  *The Dynamic Imaging Grade of Swallowing Toxicity is standardized for the head and neck cancer population, however, demonstrates promising clinical applications across populations to standardize the clinical rating of pharyngeal swallow safety and severity.   Swallow Evaluation Recommendations Recommendations: PO diet PO Diet Recommendation: Dysphagia 1 (Pureed);Moderately thick liquids (Level 3, honey thick) (1/2 tsp with honey) Liquid Administration via: Spoon (1/2 teaspoon) Medication Administration: Crushed with puree Supervision: Staff to assist with self-feeding;Full supervision/cueing for swallowing strategies Swallowing strategies  : Minimize environmental distractions;Slow rate;Small bites/sips;Multiple dry swallows after each bite/sip Postural changes: Position pt fully upright for meals Oral care recommendations: Oral care BID (2x/day)      Royce Macadamia 12/19/2023,3:07 PM

## 2023-12-19 NOTE — Consult Note (Signed)
 WOC Nurse Consult Note: Reason for Consult: deep tissue injury to sacral/buttocks area.   Wound type: Unstageable pressure injury on sacrum. Applied Flex seal today.   Pressure Injury POA: No   Measurement:  6 cm x 7 cm x 0.1 cm  Wound bed: Eschar yellow covering 90% of the wound bed, 10% red. Drainage (amount, consistency, odor) minimal serous, no odor. Periwound: maroon discoloration.   Dressing procedure/placement/frequency: Cleanse sacral wound with Vashe (lawson [id number]), do not rinse.  Apply Xeroform, change daily and cover with sacral foam dressing, change every 3 days or PRN. Obs: IS on low air loss mattress.    Will follow weekly.    Thank-you,  Denyse Amass BSN, RN, [employer], WOC  (Pager: [phone])

## 2023-12-20 ENCOUNTER — Inpatient Hospital Stay (HOSPITAL_COMMUNITY): Payer: Medicare PPO

## 2023-12-20 DIAGNOSIS — G9340 Encephalopathy, unspecified: Secondary | ICD-10-CM | POA: Diagnosis not present

## 2023-12-20 LAB — BASIC METABOLIC PANEL
Anion gap: 11 (ref 5–15)
BUN: 98 mg/dL — ABNORMAL HIGH (ref 8–23)
CO2: 26 mmol/L (ref 22–32)
Calcium: 9 mg/dL (ref 8.9–10.3)
Chloride: 95 mmol/L — ABNORMAL LOW (ref 98–111)
Creatinine, Ser: 5.35 mg/dL — ABNORMAL HIGH (ref 0.44–1.00)
GFR, Estimated: 8 mL/min — ABNORMAL LOW (ref >60)
Glucose, Bld: 137 mg/dL — ABNORMAL HIGH (ref 70–99)
Potassium: 4.9 mmol/L (ref 3.5–5.1)
Sodium: 132 mmol/L — ABNORMAL LOW (ref 135–145)

## 2023-12-20 LAB — GLUCOSE, CAPILLARY
Glucose-Capillary: 107 mg/dL — ABNORMAL HIGH (ref 70–99)
Glucose-Capillary: 110 mg/dL — ABNORMAL HIGH (ref 70–99)
Glucose-Capillary: 130 mg/dL — ABNORMAL HIGH (ref 70–99)
Glucose-Capillary: 131 mg/dL — ABNORMAL HIGH (ref 70–99)
Glucose-Capillary: 94 mg/dL (ref 70–99)

## 2023-12-20 LAB — CBC WITH DIFFERENTIAL/PLATELET
Abs Immature Granulocytes: 0.16 10*3/uL — ABNORMAL HIGH (ref 0.00–0.07)
Basophils Absolute: 0.1 10*3/uL (ref 0.0–0.1)
Basophils Relative: 0 %
Eosinophils Absolute: 0.3 10*3/uL (ref 0.0–0.5)
Eosinophils Relative: 2 %
HCT: 22.5 % — ABNORMAL LOW (ref 36.0–46.0)
Hemoglobin: 7.6 g/dL — ABNORMAL LOW (ref 12.0–15.0)
Immature Granulocytes: 1 %
Lymphocytes Relative: 14 %
Lymphs Abs: 2.8 10*3/uL (ref 0.7–4.0)
MCH: 31.4 pg (ref 26.0–34.0)
MCHC: 33.8 g/dL (ref 30.0–36.0)
MCV: 93 fL (ref 80.0–100.0)
Monocytes Absolute: 2.3 10*3/uL — ABNORMAL HIGH (ref 0.1–1.0)
Monocytes Relative: 12 %
Neutro Abs: 14.3 10*3/uL — ABNORMAL HIGH (ref 1.7–7.7)
Neutrophils Relative %: 71 %
Platelets: 509 10*3/uL — ABNORMAL HIGH (ref 150–400)
RBC: 2.42 MIL/uL — ABNORMAL LOW (ref 3.87–5.11)
RDW: 17.8 % — ABNORMAL HIGH (ref 11.5–15.5)
WBC: 19.9 10*3/uL — ABNORMAL HIGH (ref 4.0–10.5)
nRBC: 0.1 % (ref 0.0–0.2)

## 2023-12-20 LAB — PROCALCITONIN: Procalcitonin: 2.04 ng/mL

## 2023-12-20 LAB — C-REACTIVE PROTEIN: CRP: 4.2 mg/dL — ABNORMAL HIGH (ref <1.0)

## 2023-12-20 MED ORDER — NEPRO/CARBSTEADY PO LIQD: 237.0000 mL | ORAL | Status: DC | PRN

## 2023-12-20 MED ORDER — DOCUSATE SODIUM 50 MG/5ML PO LIQD
100.0000 mg | Freq: Two times a day (BID) | ORAL | Status: DC | PRN
Start: 2023-12-20 — End: 2023-12-29

## 2023-12-20 MED ORDER — PENTAFLUOROPROP-TETRAFLUOROETH EX AERO
1.0000 | INHALATION_SPRAY | CUTANEOUS | Status: DC | PRN
Start: 2023-12-20 — End: 2023-12-20

## 2023-12-20 MED ORDER — ACETAMINOPHEN 650 MG RE SUPP: 650.0000 mg | Freq: Four times a day (QID) | RECTAL | Status: DC | PRN

## 2023-12-20 MED ORDER — LIDOCAINE-PRILOCAINE 2.5-2.5 % EX CREA: 1.0000 | TOPICAL_CREAM | CUTANEOUS | Status: DC | PRN

## 2023-12-20 MED ORDER — ANTICOAGULANT SODIUM CITRATE 4% (200MG/5ML) IV SOLN: 5.0000 mL | Status: DC | PRN

## 2023-12-20 MED ORDER — ALTEPLASE 2 MG IJ SOLR: 2.0000 mg | Freq: Once | INTRAMUSCULAR | Status: DC | PRN

## 2023-12-20 MED ORDER — HEPARIN SODIUM (PORCINE) 1000 UNIT/ML DIALYSIS: 1500.0000 [IU] | INTRAMUSCULAR | Status: DC | PRN

## 2023-12-20 MED ORDER — HEPARIN SODIUM (PORCINE) 1000 UNIT/ML DIALYSIS: 1000.0000 [IU] | INTRAMUSCULAR | Status: DC | PRN

## 2023-12-20 MED ORDER — HEPARIN SODIUM (PORCINE) 1000 UNIT/ML IJ SOLN
INTRAMUSCULAR | Status: AC
  Filled 2023-12-20: qty 4

## 2023-12-20 MED ORDER — LIDOCAINE HCL (PF) 1 % IJ SOLN: 5.0000 mL | INTRAMUSCULAR | Status: DC | PRN

## 2023-12-20 MED ORDER — ACETAMINOPHEN 325 MG PO TABS
650.0000 mg | ORAL_TABLET | Freq: Four times a day (QID) | ORAL | Status: DC | PRN
Start: 2023-12-20 — End: 2023-12-29
  Administered 2023-12-28: 650 mg via ORAL
  Filled 2023-12-20: qty 2

## 2023-12-20 MED ORDER — HEPARIN SODIUM (PORCINE) 1000 UNIT/ML DIALYSIS: 2500.0000 [IU] | Freq: Once | INTRAMUSCULAR | Status: DC

## 2023-12-20 MED ORDER — ANTICOAGULANT SODIUM CITRATE 4% (200MG/5ML) IV SOLN
5.0000 mL | Status: DC | PRN
Start: 2023-12-20 — End: 2023-12-20

## 2023-12-20 MED ORDER — PENTAFLUOROPROP-TETRAFLUOROETH EX AERO: 1.0000 | INHALATION_SPRAY | CUTANEOUS | Status: DC | PRN

## 2023-12-20 MED ORDER — ROSUVASTATIN CALCIUM 20 MG PO TABS
10.0000 mg | ORAL_TABLET | Freq: Every day | ORAL | Status: DC
  Administered 2023-12-21 – 2023-12-28 (×8): 10 mg via ORAL
  Filled 2023-12-20 (×8): qty 2

## 2023-12-20 MED ORDER — LIDOCAINE-PRILOCAINE 2.5-2.5 % EX CREA
1.0000 | TOPICAL_CREAM | CUTANEOUS | Status: DC | PRN
Start: 2023-12-20 — End: 2023-12-20

## 2023-12-20 MED ORDER — NEPRO/CARBSTEADY PO LIQD
1000.0000 mL | ORAL | Status: DC
  Administered 2023-12-22 – 2023-12-27 (×3): 1000 mL
  Filled 2023-12-20 (×3): qty 1000

## 2023-12-20 MED ORDER — DARBEPOETIN ALFA 200 MCG/0.4ML IJ SOSY
200.0000 ug | PREFILLED_SYRINGE | INTRAMUSCULAR | Status: DC
  Administered 2023-12-25: 200 ug via SUBCUTANEOUS
  Filled 2023-12-20: qty 0.4

## 2023-12-20 MED ORDER — MELATONIN 3 MG PO TABS
3.0000 mg | ORAL_TABLET | Freq: Every day | ORAL | Status: DC
  Administered 2023-12-20 – 2023-12-28 (×6): 3 mg via ORAL
  Filled 2023-12-20 (×8): qty 1

## 2023-12-20 MED ORDER — INSULIN GLARGINE 100 UNIT/ML ~~LOC~~ SOLN
6.0000 [IU] | Freq: Every day | SUBCUTANEOUS | Status: DC
  Administered 2023-12-20 – 2023-12-24 (×5): 6 [IU] via SUBCUTANEOUS
  Filled 2023-12-20 (×5): qty 0.06

## 2023-12-20 MED ORDER — POLYETHYLENE GLYCOL 3350 17 G PO PACK: 17.0000 g | PACK | Freq: Every day | ORAL | Status: DC | PRN

## 2023-12-20 NOTE — Progress Notes (Signed)
 PROGRESS NOTE                                                                                                                                                                                                             Patient Demographics:    Carolyn Russo, is a 73 y.o. female, DOB - 02/13/1951, IRC:[id number]  Outpatient Primary MD for the patient is Annita Brod, MD    LOS - 34  Admit date - 11/16/2023    Chief Complaint  Patient presents with   Chest Pain   Dialysis pt       Brief Narrative (HPI from H&P)   73 yo female with ESRD on HD MWF, CHF, previous CVA with residual left sided weakness, T2DM, right BKA, and seizure disorder. She presented to [facility] on 1/23 with confusion, left sided chest pain, shortness of breath, and coughing. She was found to be hypoxic on room air and placed on 2 L [place]. CT chest was done without evidence of PE.  MRI revealed a 1.3cm acute to early subacute right PICA infarct. Also was admitted with pneumonia due to COVID and RSV and was started on antibiotics for likely superimposed bacterial infection.    On 1/30 early morning the patient was ~2 hrs into dialysis when her blood pressure dropped. UF goal was decreased but since the patient's level of consciousnesses decreased, she received 300 cc NS bolus. EKG changes were noted and rapid response was activated, dialysis stopped, and blood returned to the patient. Around 6:30 AM the patient was noted to be apneic without a pulse and code blue was called. Patient was noted to have PEA arrest. CPR was done for a few minutes before ROSC was achieved. She was transferred to 69M and patient was intubated due to failure to protect airway.  She continued to be tenuous and minimally responsive, finally she was extubated on 12/03/2023 transferred to my care on 12/09/2023, early in the morning she was fairly stable however within a few hours she had an episode of  emesis witnessed by her husband subsequently developed A-fib RVR and respiratory distress, cardiology PCCM again called bedside and she was transferred to ICU, she stayed there for another 5 days stabilized however still dependent on NG tube feeds, was seen by palliative care.  She was transferred to my service on 12/16/2023 again.  Overall condition report is extremely tenuous, still very high risk for aspiration, still overall extremely weak and deconditioned, family for now wants to pursue all modalities of treatment.  She is currently on NG tube, needs 5-day vacation from DAPT to get a peg tube.   Significant Hospital Events & Prcedures:    1/23 admit to [facility] for acute infarct + covid/rsv pneumonia 1/30 admit to ICU after patient had PEA arrest  2/1 Failed SBT due to apnea. ABG with respiratory alkalosis. RR decreased 2/2 Tolerating SBT. Remains encephalopathic 2/5 Continues to tolerate SBT during day, but remains encephalopathic/unable to protect airway if extubated  2/9 extubated, off precedex 2/12 still lethargic, ABG 2/11 unremarkable, limit centrally acting meds 2/15 transferred to my care, still somnolent on NG tube feeds, aspirated her vomitus, again in respiratory distress, considered back to ICU 12/14/2023 transferred by from ICU, transferred to my service on 12/16/2023 12/19/2023.  Modified barium swallow.  Started on dysphagia 1 diet with honey thick liquids   Unfortunately overall patient continues to appear quite weak and frail, still NG tube feeding dependent, very weak cough reflex, very high risk for recurrent aspiration of oral secretions and regurgitated food material, overall still bedbound, overall prognosis appears poor explained to patient's husband clearly bedside on 12/17/2023.  Called and discussed with son on 12/18/2023 as well.    Subjective:   Patient in bed, appears comfortable, denies any headache, no fever, no chest pain or pressure, no shortness of breath , no  abdominal pain. No new focal weakness.   Assessment  & Plan :    PEA arrest: due to hypotension during HD, not primarily cardiac in nature, intubated for airway protection and stabilized in ICU, seen by cardiology initially, now stable from cardiac arrest standpoint.  Acute encephalopathy: secondary to post-arrest, stroke, metabolic derangements, and drug induced. Concern for cefepime toxicity given her hx of ESRD, med was discontinued 2/4. MRI brain did show a new and acute central left cerebellar infarct.  Appreciate neurology recs.  Stroke not felt to be significant enough to explain altered mental status, Seroquel and amitriptyline have been stopped, mentation gradually improving now.  Still unable to swallow safely still NG tube bound on 12/16/2023.  Aspiration on 12/09/2023 followed by acute hypoxic respiratory failure with respiratory distress and A-fib RVR.  Treated in ICU, now off of antibiotics, still on NG tube feeds, still has dysphagia, still high risk for aspiration of oral contents, peg tube was decided to be placed in the ICU however family wants to hold off till 2/29/2025 or later.  Acute to early subacute right PICA infarct on 1/24, resolved New acute central left cerebellar infarct  - Appreciate neuro follow up , on DAPT with aspirin and brilinta along with statin, note neuro was consulted as PEG tube was being planned and they are okay holding DAPT for 5 days prior to PEG tube placement, however family wants to hold off on feeding tube till 2/29/2025 or later, they want to continue NG tube feeds for now.    Patient also underwent modified barium swallow on 12/19/2023, for now placed on dysphagia 1 diet with honey thick liquids, monitor with caution if continues to tolerate for 1 to 2 days then will discontinue the NG tube.  - Needs 30 d heart monitor at discharge   Paroxysmal atrial fibrillation with question of possible underlying sick sinus syndrome.  Seen by cardiology.   Currently on oral amiodarone.  Long-term anticoagulation not recommended by cardiology this admission.  Hypertension  - Currently on hydralazine via tube.   Leukocytosis Sinusitis - MRI brain noted progressed mastoid effusions, she has finished her treatment with Augmentin   Pneumonia due to COVID and RSV - Productive Cough, subsequently aspiration pneumonia.  Initial treatment for this problem off of antibiotics now.  Will try and keep tube feed rate around 35 cc an hour as she tends to aspirate on higher rate of tube feed.  Abnormal C6 signal MRI showed a nonspecific bone marrow signal of c6 vertebral body - unclear if related to a malignancy vs discitis/osteomyelitis. Will hold off on C spine imaging, as family does not want to pursue this workup and priority is mental status/neuro recovery.    If no other findings, consider C spine MRI to further evaluate   Seizure disorder  - Continue home keppra 500 mg BID seen by neurology this admission.   ESRD on HD MWF - Dialysis per nephro - Discontinued renvela due to low phos levels   Anemia of chronic disease - 1 unit blood on 2/10, Hgb stable - trend CBC - Aranesp every Friday    Diabetes - Continue SSI along with long-acting insulin, dose adjusted on 12/20/2023.  Monitor. - Tube feeds via NG tube along with dysphagia 1 diet for now.  CBG (last 3)  Recent Labs    12/19/23 1958 12/19/23 2322 12/20/23 0327  GLUCAP 133* 104* 94         Condition - Extremely Guarded  Family Communication  :    Updated son [phone]  in detail on 12/18/2023, wants to hold off on PEG tube till 2/29/2025, called 12/20/2023 at 8:20 AM.  No response.  Husband x 2 on 12/09/23, husband bedside 12/17/2023, called [phone] and left message on 12/20/2023 at 8:24 AM, called [phone] - updated   Code Status :  Full  Consults  :  PCCM, Cards, Pall Care, Renal,     PUD Prophylaxis : PPI   Procedures  :            Disposition Plan  :     Status is: Inpatient    DVT Prophylaxis  :    SCDs Start: 11/23/23 0716 heparin injection 5,000 Units Start: 11/17/23 0930     Lab Results  Component Value Date   PLT 509 (H) 12/20/2023    Diet :  Diet Order             DIET - DYS 1 Room service appropriate? No; Fluid consistency: Honey Thick  Diet effective now                    Inpatient Medications  Scheduled Meds:  arformoterol  15 mcg Nebulization BID   aspirin  81 mg Oral Daily   budesonide (PULMICORT) nebulizer solution  0.5 mg Nebulization BID   Chlorhexidine Gluconate Cloth  6 each Topical Q0600   Chlorhexidine Gluconate Cloth  6 each Topical Q0600   clopidogrel  75 mg Oral Daily   darbepoetin (ARANESP) injection - DIALYSIS  150 mcg Subcutaneous Q Mon-1800   ferrous sulfate  300 mg Oral Daily   fiber supplement (BANATROL TF)  60 mL Per Tube BID   free water  100 mL Per Tube Q6H   [START ON 12/21/2023] heparin  2,500 Units Dialysis Once in dialysis   heparin injection (subcutaneous)  5,000 Units Subcutaneous Q8H   heparin sodium (porcine)  3,200 Units Intracatheter Once   hydrALAZINE  50 mg Oral Q8H   insulin  aspart  0-15 Units Subcutaneous Q4H   insulin aspart  4 Units Subcutaneous Q4H   insulin glargine  10 Units Subcutaneous Daily   levETIRAcetam  500 mg Oral BID   multivitamin  1 tablet Oral QHS   nutrition supplement (JUVEN)  1 packet Per Tube BID BM   mouth rinse  15 mL Mouth Rinse Q4H   rosuvastatin  10 mg Per Tube Daily   sodium chloride flush  3 mL Intravenous Q12H   Continuous Infusions:  anticoagulant sodium citrate     feeding supplement (NEPRO CARB STEADY) 1,000 mL (12/18/23 1448)   PRN Meds:.acetaminophen **OR** acetaminophen, alteplase, anticoagulant sodium citrate, docusate, feeding supplement (NEPRO CARB STEADY), heparin, ipratropium-albuterol, labetalol, lidocaine (PF), lidocaine-prilocaine, ondansetron (ZOFRAN) IV, mouth rinse, pentafluoroprop-tetrafluoroeth, polyethylene  glycol, Racepinephrine HCl, white petrolatum, witch hazel-glycerin    Objective:   Vitals:   12/19/23 2055 12/20/23 0000 12/20/23 0400 12/20/23 0815  BP:  (!) 134/47    Pulse: 81 80 74   Resp: (!) 26 18 16    Temp:  98.1 F (36.7 C) 98.7 F (37.1 C)   TempSrc:  Oral Oral   SpO2: 100% 100% 100% 100%  Weight:      Height:        Wt Readings from Last 3 Encounters:  12/19/23 70.9 kg  11/01/23 79.5 kg  08/24/23 83 kg    No intake or output data in the 24 hours ending 12/20/23 3474    Physical Exam  Awake slightly more alert, responds to loud verbal commands and follows some basic instructions, NG tube in place, R BKA [place].AT,PERRAL Supple Neck, No JVD,   Symmetrical Chest wall movement, Good air movement bilaterally, bilateral breath sounds RRR,No Gallops,Rubs or new Murmurs,  +ve B.Sounds, Abd Soft, No tenderness,   No Cyanosis, Clubbing or edema     RN pressure injury documentation: Pressure Injury 12/02/23 Sacrum Medial Stage 2 -  Partial thickness loss of dermis presenting as a shallow open injury with a red, pink wound bed without slough. (Active)  12/02/23 1402  Location: Sacrum  Location Orientation: Medial  Staging: Stage 2 -  Partial thickness loss of dermis presenting as a shallow open injury with a red, pink wound bed without slough.  Wound Description (Comments):   Present on Admission: No  Dressing Type Foam - Lift dressing to assess site every shift 12/19/23 1915      Data Review:    Recent Labs  Lab 12/16/23 0621 12/17/23 0535 12/18/23 0539 12/19/23 0543 12/20/23 0459  WBC 23.9* 18.9* 19.7* 20.5* 19.9*  HGB 8.6* 7.7* 8.1* 7.5* 7.6*  HCT 25.6* 23.2* 23.8* 22.7* 22.5*  PLT 542* 494* 545* 533* 509*  MCV 92.8 93.5 93.3 93.8 93.0  MCH 31.2 31.0 31.8 31.0 31.4  MCHC 33.6 33.2 34.0 33.0 33.8  RDW 18.1* 18.0* 18.1* 18.2* 17.8*  LYMPHSABS 2.6 2.7 3.0 3.0 2.8  MONOABS 1.6* 1.6* 2.3* 2.3* 2.3*  EOSABS 0.3 0.3 0.4 0.4 0.3  BASOSABS 0.1 0.1 0.1 0.1  0.1    Recent Labs  Lab 12/15/23 0359 12/16/23 0621 12/17/23 0535 12/18/23 0538 12/18/23 0539 12/18/23 1114 12/19/23 0439  NA 136 137 136  --  138  --  133*  K 4.8 5.4* 5.2*  --  5.5*  --  4.9  CL 96* 96* 92*  --  98  --  97*  CO2 28 30 26   --  28  --  27  ANIONGAP 12 11 18*  --  12  --  9  GLUCOSE 186* 206* 231*  --  173*  --  158*  BUN 54* 85* 71*  --  94*  --  61*  CREATININE 3.26* 4.87* 4.46*  --  5.67*  --  3.72*  ALBUMIN  --   --   --   --   --   --  2.4*  CRP  --   --   --   --   --  1.7*  --   PROCALCITON  --   --   --  1.47  --   --   --   BNP  --   --   --  138.7*  --   --   --   PHOS  --   --   --   --   --   --  3.6  CALCIUM 9.3 9.7 10.1  --  9.8  --  9.2      Recent Labs  Lab 12/15/23 0359 12/16/23 0621 12/17/23 0535 12/18/23 0538 12/18/23 0539 12/18/23 1114 12/19/23 0439  CRP  --   --   --   --   --  1.7*  --   PROCALCITON  --   --   --  1.47  --   --   --   BNP  --   --   --  138.7*  --   --   --   CALCIUM 9.3 9.7 10.1  --  9.8  --  9.2    --------------------------------------------------------------------------------------------------------------- Lab Results  Component Value Date   CHOL 90 11/17/2023   HDL 38 (L) 11/17/2023   LDLCALC 37 11/17/2023   TRIG 86 12/02/2023   CHOLHDL 2.4 11/17/2023    Lab Results  Component Value Date   HGBA1C 5.8 (H) 10/26/2023    Micro Results No results found for this or any previous visit (from the past 240 hours).  Radiology Report DG Chest Port 1 View Result Date: 12/20/2023 CLINICAL DATA:  141880 SOB (shortness of breath) [id number] EXAM: PORTABLE CHEST 1 VIEW COMPARISON:  Chest x-ray 12/19/2023, CT chest 11/16/2023 FINDINGS: Right chest wall dialysis catheter with tip overlying the right atrium. The heart and mediastinal contours are unchanged. Atherosclerotic plaque. Coronary artery calcification. Retrocardiac airspace opacity. Left base atelectasis. No pulmonary edema. Persistent trace to small left  pleural effusion. Possible trace right pleural effusion. No pneumothorax. No acute osseous abnormality. IMPRESSION: 1. Retrocardiac airspace opacity. 2. Persistent trace to small left pleural effusion. 3. Possible trace right pleural effusion. 4.  Aortic Atherosclerosis (ICD10-I70.0). Electronically Signed   By: Tish Frederickson M.D.   On: 12/20/2023 07:01   DG Swallowing Func-Speech Pathology Result Date: 12/19/2023 Table formatting from the original result was not included. Modified Barium Swallow Study Patient Details Name: DANN VENTRESS MRN: [id number] Date of Birth: November 18, 1950 Today's Date: 12/19/2023 HPI/PMH: HPI: Pt is a 73 y.o. female with who presented from SNF on 11/16/23 for chest pain, weakness and confusion. CT head revealed infarct in inferior right cerebellum which was new since 06/12/22. MRI brain with acute right inferomedial cerebellar ischemic infarction in the right PICA territory. Pt also diagnosed with COVID/RSV. On 2/15  transferred back to ICU for atrial fibrillation with rapid ventricular response that developed after a gross aspiration event from vomiting. MBS 1/28 DIGEST swallow severity rating of 1, mild. Recommended dys2/thin liquids. Secondary swallows needed to clear pharyngeal residue. Experienced cardiac arrest on 1/30 and was intubated until 2/9 (ten days) and remained NPO thereafter with cortrak.  PMH: CVA in 2022 and 2023, right BKA, HTN, DM-2, HLD, ESRD on HD MWF, asthma, dysphagia, CHF, heart murmur, seizure. Repeat instrumental assessment with MBS. Clinical Impression: Clinical Impression: Pt demonstrated improved swallow function compared to FEES on 2/18 and was adequately awake. Oral impairments include slow lingual transport and mild-mod lingual surface and tongue base residue that spills to to pyriform sinuses followed by spontaneous swallow which cleared. At end of study she had mild vallecular residue with honey thick that cleared with cued second swallow. Pt exhibits  decreased timing with one episode of  teaspoon honey thick penetrating before the swallow and aspirated during with strong reflexive cough. Penetrates cleared however shoulder obscuring view of trachea. Residue penetrated x 1 with honey thick (PAS 5) out of multiple trials during the swallow. Teaspoon nectar thick also penetrated prior to laryngeal closure and aspirated during the swallow with a strong reflexive delayed cough (could not detect if ejected from trachea with shoulder obstruction however cleared the vestibule). Solid and thin liquids were not administered and order of bolus presentation of MBS-Imp not followed this study. She was given a DIGEST score of 2 (moderate impairment). Recommend pt initiate a Dys 1 (puree), honey thick liquid given by 1/2 teaspoon amounts only when adequately awake,  second swallow and pt will require full assist/supervision with meals. ST will continue to follow for safety and efficiency. Results relayed to MD and pt's husband via phone. Factors that may increase risk of adverse event in presence of aspiration Rubye Oaks & Clearance Coots 2021): Factors that may increase risk of adverse event in presence of aspiration Rubye Oaks & Clearance Coots 2021): Reduced cognitive function Recommendations/Plan: Swallowing Evaluation Recommendations Swallowing Evaluation Recommendations Recommendations: PO diet PO Diet Recommendation: Dysphagia 1 (Pureed); Moderately thick liquids (Level 3, honey thick) (1/2 tsp with honey) Liquid Administration via: Spoon (1/2 teaspoon) Medication Administration: Crushed with puree Supervision: Staff to assist with self-feeding; Full supervision/cueing for swallowing strategies Swallowing strategies  : Minimize environmental distractions; Slow rate; Small bites/sips; Multiple dry swallows after each bite/sip Postural changes: Position pt fully upright for meals Oral care recommendations: Oral care BID (2x/day) Treatment Plan Treatment Plan Treatment recommendations: Therapy  as outlined in treatment plan below Follow-up recommendations: Skilled nursing-short term rehab (<3 hours/day) Functional status assessment: Patient has had a recent decline in their functional status and demonstrates the ability to make significant improvements in function in a reasonable and predictable amount of time. Treatment frequency: Min 2x/week Treatment duration: 2 weeks Interventions: Aspiration precaution training; Patient/family education; Diet toleration management by SLP Recommendations Recommendations for follow up therapy are one component of a multi-disciplinary discharge planning process, led by the attending physician.  Recommendations may be updated based on patient status, additional functional criteria and insurance authorization. Assessment: Orofacial Exam: Orofacial Exam Oral Cavity: Oral Hygiene: WFL Oral Cavity - Dentition: Adequate natural dentition Orofacial Anatomy: WFL Oral Motor/Sensory Function: WFL Anatomy: Anatomy: Suspected cervical osteophytes Boluses Administered: Boluses Administered Boluses Administered: Mildly thick liquids (Level 2, nectar thick); Moderately thick liquids (Level 3, honey thick); Puree  Oral Impairment Domain: Oral Impairment Domain Lip Closure: No labial escape Tongue control during bolus hold: -- (unable to hold bolus) Bolus preparation/mastication: -- (did not give solid) Bolus transport/lingual motion: Slow tongue motion Oral residue: Residue collection on oral structures Location of oral residue : Tongue Initiation of pharyngeal swallow : Valleculae; Pyriform sinuses  Pharyngeal Impairment Domain: Pharyngeal Impairment Domain Soft palate elevation: No bolus between soft palate (SP)/pharyngeal wall (PW) Laryngeal elevation: Complete superior movement of thyroid  cartilage with complete approximation of arytenoids to epiglottic petiole Anterior hyoid excursion: Partial anterior movement Epiglottic movement: Complete inversion Laryngeal vestibule closure:  Incomplete, narrow column air/contrast in laryngeal vestibule Pharyngeal stripping wave : Present - complete Pharyngeal contraction (A/P view only): N/A Pharyngoesophageal segment opening: Complete distension and complete duration, no obstruction of flow Tongue base retraction: Narrow column of contrast or air between tongue base and PPW Pharyngeal residue: Collection of residue within or on pharyngeal structures Location of pharyngeal residue: Valleculae  Esophageal Impairment Domain: Esophageal Impairment Domain Esophageal clearance upright position: Complete clearance, esophageal coating Pill: No data recorded Penetration/Aspiration Scale Score: Penetration/Aspiration Scale Score 1.  Material does not enter airway: Puree 5.  Material enters airway, CONTACTS cords and not ejected out: Mildly thick liquids (Level 2, nectar thick); Moderately thick liquids (Level 3, honey thick) 6.  Material enters airway, passes BELOW cords then ejected out: Moderately thick liquids (Level 3, honey thick) 7.  Material enters airway, passes BELOW cords and not ejected out despite cough attempt by patient: Mildly thick liquids (Level 2, nectar thick) Compensatory Strategies: Compensatory Strategies Compensatory strategies: Yes Multiple swallows: Effective Effective Multiple Swallows: Moderately thick liquid (Level 3, honey thick)   General Information: Caregiver present: No  Diet Prior to this Study: NPO; Cortrak/Small bore NG tube   Temperature : Normal   Respiratory Status: WFL   Supplemental O2: Nasal cannula   History of Recent Intubation: Yes  Behavior/Cognition: Alert; Cooperative; Pleasant mood; Requires cueing Self-Feeding Abilities: Needs assist with self-feeding Baseline vocal quality/speech: Dysphonic No data recorded No data recorded Exam Limitations: Limited visibility (with shoulder to determine if aspirate cleared out with cough) Goal Planning: Prognosis for improved oropharyngeal function: -- (fair-good) Barriers to  Reach Goals: Cognitive deficits No data recorded No data recorded Consulted and agree with results and recommendations: Patient; Family member/caregiver Pain: Pain Assessment Pain Assessment: No/denies pain Faces Pain Scale: 0 Breathing: 0 Negative Vocalization: 0 Facial Expression: 0 Body Language: 0 Consolability: 0 PAINAD Score: 0 Facial Expression: 0 Body Movements: 0 Muscle Tension: 0 Compliance with ventilator (intubated pts.): N/A Vocalization (extubated pts.): 0 CPOT Total: 0 End of Session: Start Time:SLP Start Time (ACUTE ONLY): 1220 Stop Time: SLP Stop Time (ACUTE ONLY): 1241 Time Calculation:SLP Time Calculation (min) (ACUTE ONLY): 21 min Charges: SLP Evaluations $ SLP Speech Visit: 1 Visit SLP Evaluations $MBS Swallow: 1 Procedure $Swallowing Treatment: 1 Procedure SLP visit diagnosis: SLP Visit Diagnosis: Dysphagia, oropharyngeal phase (R13.12) Past Medical History: Past Medical History: Diagnosis Date  Acute GI bleeding   Allergy   Anemia   Anterior chest wall pain   Appendicitis 1965  Asthma   Body mass index 37.0-37.9, adult   Breast pain   Cataract   both eyes  CHF (congestive heart failure) (HCC)   Cognitive change 04/20/2021  r/t cva 03/2821  Complication of anesthesia   Memory loss after general  Dehydration 2014  Deviated septum 1971  Diabetes mellitus   Dysphagia due to old stroke   easy to get strangled when eating  Dyspnea 2014  ESRD on hemodialysis (HCC)   MWF at [facility]  Extrinsic asthma   WITH ASTHMA ATTACK  Fibroid 1980  GERD (gastroesophageal reflux disease)   Heart murmur   History of migraine   History of seizure   with stroke  Hx gestational diabetes   Hyperlipidemia   Hypertension 2014  Inguinal hernia 1959  Malaise and fatigue 2014  Non-IgE mediated allergic asthma 2014  Obesity   Pelvic pain  Pregnancy, high-risk 1985  Stroke (HCC) 04/20/2021  (CVA) of right basal ganglia  Tonsillitis 1968  Uterine fibroid 1980  Visual field defect   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 Past Surgical History:  Past Surgical History: Procedure Laterality Date  ABDOMINAL AORTOGRAM W/LOWER EXTREMITY N/A 02/21/2022  Procedure: ABDOMINAL AORTOGRAM W/LOWER EXTREMITY;  Surgeon: Maeola Harman, MD;  Location: [facility] INVASIVE CV LAB;  Service: Cardiovascular;  Laterality: N/A;  AMPUTATION Right 05/18/2022  Procedure: RIGHT BELOW KNEE AMPUTATION;  Surgeon: Nadara Mustard, MD;  Location[facility] OR;  Service: Orthopedics;  Laterality: Right;  AMPUTATION TOE Right 04/15/2022  Procedure: AMPUTATION  LST TOERIGHT FOOT;  Surgeon: Edwin Cap, DPM;  Location: [facility] ORS;  Service: Podiatry;  Laterality: Right;  APPENDECTOMY  1959  BASCILIC VEIN TRANSPOSITION Right 04/30/2021  Procedure: RIGHT FIRST STAGE BASCILIC VEIN TRANSPOSITION;  Surgeon: Chuck Hint, MD;  Location: [facility] OR;  Service: Vascular;  Laterality: Right;  CESAREAN SECTION  1985  COLONOSCOPY    ESOPHAGOGASTRODUODENOSCOPY (EGD) WITH PROPOFOL N/A 04/18/2021  Procedure: ESOPHAGOGASTRODUODENOSCOPY (EGD) WITH PROPOFOL;  Surgeon: Iva Boop, MD;  Location: [facility] ENDOSCOPY;  Service: Endoscopy;  Laterality: N/A;  EYE SURGERY    bilateral cataract   FLEXIBLE SIGMOIDOSCOPY N/A 04/18/2021  Procedure: FLEXIBLE SIGMOIDOSCOPY;  Surgeon: Iva Boop, MD;  Location: [facility] ENDOSCOPY;  Service: Endoscopy;  Laterality: N/A;  HERNIA REPAIR  1959  IR FLUORO GUIDE CV LINE RIGHT  03/18/2021  IR US GUIDE VASC ACCESS RIGHT  03/18/2021  LEFT HEART CATH AND CORONARY ANGIOGRAPHY N/A 04/04/2017  Procedure: Left Heart Cath and Coronary Angiography;  Surgeon: Lyn Records, MD;  Location: [facility] INVASIVE CV LAB;  Service: Cardiovascular;  Laterality: N/A;  MYOMECTOMY  1980, 2004, 2007  PERIPHERAL VASCULAR THROMBECTOMY  02/21/2022  Procedure: PERIPHERAL VASCULAR THROMBECTOMY;  Surgeon: Maeola Harman, MD;  Location: [facility] INVASIVE CV LAB;  Service: Cardiovascular;;  Right Tibial  RHINOPLASTY  1971  ROTATOR CUFF REPAIR  2003  SURGICAL REPAIR OF HEMORRHAGE  2015  TONSILLECTOMY  1968 Royce Macadamia 12/19/2023, 3:06 PM  DG Chest Port 1 View Result Date: 12/19/2023 CLINICAL DATA:  Shortness of breath EXAM: PORTABLE CHEST 1 VIEW COMPARISON:  Yesterday FINDINGS: Stable cardiomegaly. Improved aeration in the right upper lobe along the minor fissure. Retrocardiac density attributed atelectasis and underpenetration by prior CT. No effusion or pneumothorax seen. Dialysis catheter with tip at the right atrium. Feeding tube which at least reaches the stomach. IMPRESSION: Improved aeration on the right.  No new finding. Electronically Signed   By: Tiburcio Pea M.D.   On: 12/19/2023 07:14     Signature  -   Susa Raring M.D on 12/20/2023 at 8:22 AM   -  To page go to [email or web address]

## 2023-12-20 NOTE — TOC Progression Note (Signed)
 Transition of Care [facility]) - Progression Note    Patient Details  Name: Carolyn Russo MRN: [id number] Date of Birth: 1951/07/25  Transition of [facility]) CM/SW Contact  Mearl Latin, LCSW Phone Number: 12/20/2023, 10:56 AM  Clinical Narrative:    CSW provided update to [facility]. CSW will attempt to obtain insurance approval for rehab once closer to being stable.    Expected Discharge Plan: Skilled Nursing Facility Barriers to Discharge: Continued Medical Work up, English as a second language teacher  Expected Discharge Plan and Services In-house Referral: Clinical Social Work   Post Acute Care Choice: Skilled Nursing Facility Living arrangements for the past 2 months: Single Family Home, Skilled Nursing Facility                                       Social Determinants of Health (SDOH) Interventions SDOH Screenings   Food Insecurity: No Food Insecurity (11/17/2023)  Housing: Low Risk  (11/17/2023)  Transportation Needs: No Transportation Needs (11/17/2023)  Utilities: Not At Risk (11/17/2023)  Alcohol Screen: Low Risk  (01/09/2019)  Depression (PHQ2-9): Low Risk  (09/11/2023)  Financial Resource Strain: Low Risk  (09/02/2021)  Physical Activity: Inactive (09/02/2021)  Social Connections: Socially Integrated (11/17/2023)  Stress: Stress Concern Present (09/02/2021)  Tobacco Use: Low Risk  (11/16/2023)    Readmission Risk Interventions    12/15/2023    2:57 PM 02/14/2023    4:54 PM  Readmission Risk Prevention Plan  Transportation Screening Complete Complete  PCP or Specialist Appt within 3-5 Days  Complete  HRI or Home Care Consult  Complete  Social Work Consult for Recovery Care Planning/Counseling  Complete  Palliative Care Screening  Complete  Medication Review Oceanographer) Complete Complete  PCP or Specialist appointment within 3-5 days of discharge Complete   HRI or Home Care Consult Complete   SW Recovery Care/Counseling Consult Complete   Palliative Care  Screening Complete   Skilled Nursing Facility Complete

## 2023-12-20 NOTE — Plan of Care (Signed)
  Problem: Coping: Goal: Ability to adjust to condition or change in health will improve Outcome: Progressing   Problem: Fluid Volume: Goal: Ability to maintain a balanced intake and output will improve Outcome: Progressing   Problem: Metabolic: Goal: Ability to maintain appropriate glucose levels will improve Outcome: Progressing   Problem: Education: Goal: Knowledge of disease or condition will improve Outcome: Progressing Goal: Knowledge of secondary prevention will improve (MUST DOCUMENT ALL) Outcome: Progressing Goal: Knowledge of patient specific risk factors will improve (DELETE if not current risk factor) Outcome: Progressing   Problem: Ischemic Stroke/TIA Tissue Perfusion: Goal: Complications of ischemic stroke/TIA will be minimized Outcome: Progressing

## 2023-12-20 NOTE — Progress Notes (Signed)
 [facility] Progress Note   Subjective:    Seen and examined on dialysis.  Blood pressure 137/59 and HR 79.  Tolerating goal.  Procedure supervised.  RIJ tunneled catheter in use.   Review of systems:    Unable to obtain secondary to AMS, not following commands  Objective Vitals:   12/20/23 0000 12/20/23 0400 12/20/23 0815 12/20/23 0935  BP: (!) 134/47   (!) 160/44  Pulse: 80 74  88  Resp: 18 16  (!) 35  Temp: 98.1 F (36.7 C) 98.7 F (37.1 C)  98.5 F (36.9 C)  TempSrc: Oral Oral    SpO2: 100% 100% 100% 100%  Weight:    68.6 kg  Height:       Physical Exam  General elderly female chronically ill appearing; in bed in no acute distress HEENT normocephalic atraumatic extraocular movements intact sclera anicteric Neck supple trachea midline Lungs occ rhonchi to auscultation bilaterally normal work of breathing at rest on 3 liters oxygen Heart S1S2 no rub Abdomen soft nontender nondistended Extremities no edema appreciated; right BKA  Psych no anxiety or agitation Neuro -nonverbal; opens eyes to exam. Does not answer questions  Access RIJ tunn catheter; right AVF with bruit and thrill     Dialysis Orders: MWF NW  4h   B350    75.5kg   2/2 bath   R fem TDC  Heparin 2600 + 1400 midrun - last HD 1/22, getting to dry wt - venofer 100 qhd thru 1/31 - mircera 150 q2, last 1/17, due 1/31  Renal-related home meds: - rocaltrol 0.5 mwf - sensipar 30 every day - hydralazine 100 tid - losartan 50 bid - renvela 800mg  ac tid - others: brilinta, crestor, keppra, ritalin, neurontin, asa, PPI, dulera, insulin, albuterol, elavil  CXR 2/18, 2/22 - small R effusion, no edema/ congestion   Assessment/Plan: Acute hypoxic respiratory failure - multifactorial with +COVID/ +RSV pna/ volume^: resolved and S/P course of abx + HD.  CVA: R then L cerebellar infarcts d/t small vessel disease. Per neurology.  On aspirin and plavix for now (note these would need to be held prior  to PEG per charting) AMS: multifactorial. Slow improvement per charting.  Aspiration pneumonitis - noted, was moved to the ICU again for same and now back on floor. Speech is following  ESRD: on HD MWF schedule.  Will follow am labs.  HTN - controlled  Anemia of esrd: Last ESA on 2/17.  Aranesp 150 mcg every monday.  Will increase to 200 mcg every Monday.  Tsat 26% and ferritin 1638.  Getting po iron. Transfuse as needed Secondary hyperparathyroidism -  phos ok.  Reassess binders when taking po better. On nepro.    H/o seizure d/o - on Keppra PEA arrest - noted recent complicated history    Recent Labs  Lab 12/18/23 0539 12/19/23 0439 12/19/23 0543 12/20/23 0459  HGB 8.1*  --  7.5* 7.6*  ALBUMIN  --  2.4*  --   --   CALCIUM 9.8 9.2  --   --   PHOS  --  3.6  --   --   CREATININE 5.67* 3.72*  --   --   K 5.5* 4.9  --   --     Inpatient medications:  arformoterol  15 mcg Nebulization BID   aspirin  81 mg Oral Daily   budesonide (PULMICORT) nebulizer solution  0.5 mg Nebulization BID   Chlorhexidine Gluconate Cloth  6 each Topical Q0600   Chlorhexidine  Gluconate Cloth  6 each Topical Q0600   clopidogrel  75 mg Oral Daily   darbepoetin (ARANESP) injection - DIALYSIS  150 mcg Subcutaneous Q Mon-1800   ferrous sulfate  300 mg Oral Daily   fiber supplement (BANATROL TF)  60 mL Per Tube BID   free water  100 mL Per Tube Q6H   [START ON 12/21/2023] heparin  2,500 Units Dialysis Once in dialysis   heparin injection (subcutaneous)  5,000 Units Subcutaneous Q8H   heparin sodium (porcine)  3,200 Units Intracatheter Once   hydrALAZINE  50 mg Oral Q8H   insulin aspart  0-15 Units Subcutaneous Q4H   insulin aspart  4 Units Subcutaneous Q4H   insulin glargine  6 Units Subcutaneous Daily   levETIRAcetam  500 mg Oral BID   melatonin  3 mg Oral QHS   multivitamin  1 tablet Oral QHS   nutrition supplement (JUVEN)  1 packet Per Tube BID BM   mouth rinse  15 mL Mouth Rinse Q4H   rosuvastatin   10 mg Per Tube Daily   sodium chloride flush  3 mL Intravenous Q12H    anticoagulant sodium citrate     feeding supplement (NEPRO CARB STEADY) 1,000 mL (12/18/23 1448)   acetaminophen **OR** acetaminophen, alteplase, anticoagulant sodium citrate, docusate, feeding supplement (NEPRO CARB STEADY), heparin, ipratropium-albuterol, labetalol, lidocaine (PF), lidocaine-prilocaine, ondansetron (ZOFRAN) IV, mouth rinse, pentafluoroprop-tetrafluoroeth, polyethylene glycol, Racepinephrine HCl, white petrolatum, witch hazel-glycerin       Estanislado Emms, MD 10:20 AM 12/20/2023

## 2023-12-20 NOTE — Progress Notes (Signed)
   12/20/23 1425  Vitals  Temp (!) 97.4 F (36.3 C)  Pulse Rate 82  Resp 20  BP (!) 114/56  SpO2 100 %  O2 Device Nasal Cannula  Weight (S)  66.6 kg (Bed Scale)  Type of Weight Post-Dialysis  Oxygen Therapy  O2 Flow Rate (L/min) 3 L/min  Patient Activity (if Appropriate) In bed  Pulse Oximetry Type Continuous  Post Treatment  Dialyzer Clearance Clear  Hemodialysis Intake (mL) 0 mL  Liters Processed 72  Fluid Removed (mL) 2000 mL  Tolerated HD Treatment Yes  Post-Hemodialysis Comments Tx. Completed without difficulties. Pt. resting and no distress noted. Report call to 5W Bedside RN and UF goal met.   Received patient in bed to unit.  Alert and oriented.  Informed consent signed and in chart.   TX duration: 3  Patient tolerated well.  Transported back to the room  Alert, without acute distress.  Hand-off given to patient's nurse.   Access used: Yes Access issues: No  Total UF removed: 2000 Medication(s) given: See MAR Post HD VS: See Above Grid Post HD weight: 66.6 kg   [facility]

## 2023-12-20 NOTE — Progress Notes (Signed)
 SLP Cancellation Note  Patient Details Name: Carolyn Russo MRN: [id number] DOB: Mar 26, 1951   Cancelled treatment:        Was going to see for swallow prior to dialysis however dialysis on the way to get her and she needed to be cleaned before going. Did speak with husband and provided brief education about feeding, thickener etc. He will feed her when she returns from HD if alert. SLP may not be able to return this afternoon and will see her tomorrow.    Royce Macadamia 12/20/2023, 8:25 AM

## 2023-12-20 NOTE — Plan of Care (Signed)
  Problem: Coping: Goal: Ability to adjust to condition or change in health will improve Outcome: Progressing   Problem: Fluid Volume: Goal: Ability to maintain a balanced intake and output will improve Outcome: Progressing   Problem: Health Behavior/Discharge Planning: Goal: Ability to identify and utilize available resources and services will improve Outcome: Progressing Goal: Ability to manage health-related needs will improve Outcome: Progressing   Problem: Metabolic: Goal: Ability to maintain appropriate glucose levels will improve Outcome: Progressing   Problem: Nutritional: Goal: Maintenance of adequate nutrition will improve Outcome: Progressing Goal: Progress toward achieving an optimal weight will improve Outcome: Progressing   Problem: Skin Integrity: Goal: Risk for impaired skin integrity will decrease Outcome: Progressing   Problem: Education: Goal: Knowledge of disease or condition will improve Outcome: Progressing Goal: Knowledge of secondary prevention will improve (MUST DOCUMENT ALL) Outcome: Progressing Goal: Knowledge of patient specific risk factors will improve (DELETE if not current risk factor) Outcome: Progressing   Problem: Ischemic Stroke/TIA Tissue Perfusion: Goal: Complications of ischemic stroke/TIA will be minimized Outcome: Progressing   Problem: Coping: Goal: Will verbalize positive feelings about self Outcome: Progressing Goal: Will identify appropriate support needs Outcome: Progressing   Problem: Health Behavior/Discharge Planning: Goal: Ability to manage health-related needs will improve Outcome: Progressing Goal: Goals will be collaboratively established with patient/family Outcome: Progressing   Problem: Self-Care: Goal: Ability to participate in self-care as condition permits will improve Outcome: Progressing Goal: Verbalization of feelings and concerns over difficulty with self-care will improve Outcome:  Progressing Goal: Ability to communicate needs accurately will improve Outcome: Progressing   Problem: Nutrition: Goal: Dietary intake will improve Outcome: Progressing   Problem: Education: Goal: Knowledge of risk factors and measures for prevention of condition will improve Outcome: Progressing   Problem: Coping: Goal: Psychosocial and spiritual needs will be supported Outcome: Progressing   Problem: Respiratory: Goal: Will maintain a patent airway Outcome: Progressing Goal: Complications related to the disease process, condition or treatment will be avoided or minimized Outcome: Progressing   Problem: Education: Goal: Knowledge of General Education information will improve Description: Including pain rating scale, medication(s)/side effects and non-pharmacologic comfort measures Outcome: Progressing   Problem: Health Behavior/Discharge Planning: Goal: Ability to manage health-related needs will improve Outcome: Progressing   Problem: Clinical Measurements: Goal: Ability to maintain clinical measurements within normal limits will improve Outcome: Progressing Goal: Will remain free from infection Outcome: Progressing Goal: Diagnostic test results will improve Outcome: Progressing Goal: Respiratory complications will improve Outcome: Progressing Goal: Cardiovascular complication will be avoided Outcome: Progressing   Problem: Activity: Goal: Risk for activity intolerance will decrease Outcome: Progressing   Problem: Nutrition: Goal: Adequate nutrition will be maintained Outcome: Progressing   Problem: Coping: Goal: Level of anxiety will decrease Outcome: Progressing   Problem: Elimination: Goal: Will not experience complications related to bowel motility Outcome: Progressing Goal: Will not experience complications related to urinary retention Outcome: Progressing   Problem: Pain Managment: Goal: General experience of comfort will improve and/or be  controlled Outcome: Progressing   Problem: Safety: Goal: Ability to remain free from injury will improve Outcome: Progressing   Problem: Skin Integrity: Goal: Risk for impaired skin integrity will decrease Outcome: Progressing   Problem: Activity: Goal: Ability to tolerate increased activity will improve Outcome: Progressing   Problem: Respiratory: Goal: Ability to maintain a clear airway and adequate ventilation will improve Outcome: Progressing   Problem: Role Relationship: Goal: Method of communication will improve Outcome: Progressing

## 2023-12-20 NOTE — Progress Notes (Signed)
 Came to room for chest vest, RN in room to assist w/ vest placement.  Noted large BM all the way up her back.  Per disucssion w/ RN, chest vest held for now d/t pt getting cleaned up

## 2023-12-21 ENCOUNTER — Inpatient Hospital Stay (HOSPITAL_COMMUNITY): Payer: Medicare PPO

## 2023-12-21 DIAGNOSIS — G9340 Encephalopathy, unspecified: Secondary | ICD-10-CM | POA: Diagnosis not present

## 2023-12-21 LAB — CBC WITH DIFFERENTIAL/PLATELET
Abs Immature Granulocytes: 0.19 10*3/uL — ABNORMAL HIGH (ref 0.00–0.07)
Basophils Absolute: 0.1 10*3/uL (ref 0.0–0.1)
Basophils Relative: 0 %
Eosinophils Absolute: 0.2 10*3/uL (ref 0.0–0.5)
Eosinophils Relative: 1 %
HCT: 24.1 % — ABNORMAL LOW (ref 36.0–46.0)
Hemoglobin: 8.2 g/dL — ABNORMAL LOW (ref 12.0–15.0)
Immature Granulocytes: 1 %
Lymphocytes Relative: 18 %
Lymphs Abs: 3.8 10*3/uL (ref 0.7–4.0)
MCH: 31.8 pg (ref 26.0–34.0)
MCHC: 34 g/dL (ref 30.0–36.0)
MCV: 93.4 fL (ref 80.0–100.0)
Monocytes Absolute: 2.9 10*3/uL — ABNORMAL HIGH (ref 0.1–1.0)
Monocytes Relative: 14 %
Neutro Abs: 13.9 10*3/uL — ABNORMAL HIGH (ref 1.7–7.7)
Neutrophils Relative %: 66 %
Platelets: 479 10*3/uL — ABNORMAL HIGH (ref 150–400)
RBC: 2.58 MIL/uL — ABNORMAL LOW (ref 3.87–5.11)
RDW: 17.8 % — ABNORMAL HIGH (ref 11.5–15.5)
WBC: 21 10*3/uL — ABNORMAL HIGH (ref 4.0–10.5)
nRBC: 0 % (ref 0.0–0.2)

## 2023-12-21 LAB — GLUCOSE, CAPILLARY
Glucose-Capillary: 100 mg/dL — ABNORMAL HIGH (ref 70–99)
Glucose-Capillary: 127 mg/dL — ABNORMAL HIGH (ref 70–99)
Glucose-Capillary: 138 mg/dL — ABNORMAL HIGH (ref 70–99)
Glucose-Capillary: 145 mg/dL — ABNORMAL HIGH (ref 70–99)
Glucose-Capillary: 149 mg/dL — ABNORMAL HIGH (ref 70–99)
Glucose-Capillary: 83 mg/dL (ref 70–99)

## 2023-12-21 LAB — C-REACTIVE PROTEIN: CRP: 7.8 mg/dL — ABNORMAL HIGH (ref <1.0)

## 2023-12-21 LAB — PROCALCITONIN: Procalcitonin: 2.26 ng/mL

## 2023-12-21 LAB — HEPATITIS B SURFACE ANTIBODY, QUANTITATIVE: Hep B S AB Quant (Post): 9069 m[IU]/mL

## 2023-12-21 MED ORDER — CHLORHEXIDINE GLUCONATE CLOTH 2 % EX PADS
6.0000 | MEDICATED_PAD | Freq: Every day | CUTANEOUS | Status: DC
  Administered 2023-12-22: 6 via TOPICAL

## 2023-12-21 MED FILL — Medication: Qty: 1 | Status: AC

## 2023-12-21 NOTE — Progress Notes (Signed)
 Speech Language Pathology Treatment: Dysphagia;Cognitive-Linquistic  Patient Details Name: FELISSA BLOUCH MRN: [id number] DOB: Apr 04, 1951 Today's Date: 12/21/2023 Time: 7253-6644 SLP Time Calculation (min) (ACUTE ONLY): 15 min  Assessment / Plan / Recommendation Clinical Impression  Pt seen without family present for dysphagia and cognition. She required max multimodal stimulation to wake but able to remain adequately awake during bites of applesauce and 1/2 tsp amounts honey thick juice. Mild oral/pharyngeal delays however able to initiate swallows without any s/s aspiration. She was better able to produce voice today with cues to increase intensity for 2 instances but continues with decreased respiratory support and effort in attempts at volitional throat clear. She was able to produce a 2nd swallow when cued with delays x 1. Continue puree, 1/2 tsp honey thick when awake.  Her responses are delayed and needs extra time to respond with frequent repetition. She was able to state she was at [facility] but not oriented to stroke or year. After approximately 3-4 minutes she was able to recall she had a stroke but needed cues for the year. Continue tx for dysphagia and cognition.    HPI HPI: Pt is a 73 y.o. female with who presented from SNF on 11/16/23 for chest pain, weakness and confusion. CT head revealed infarct in inferior right cerebellum which was new since 06/12/22. MRI brain with acute right inferomedial cerebellar ischemic infarction in the right PICA territory. Pt also diagnosed with COVID/RSV. On 2/15  transferred back to ICU for atrial fibrillation with rapid ventricular response that developed after a gross aspiration event from vomiting. MBS 1/28 DIGEST swallow severity rating of 1, mild. Recommended dys2/thin liquids. Secondary swallows needed to clear pharyngeal residue. Experienced cardiac arrest on 1/30 and was intubated until 2/9 (ten days) and remained NPO thereafter with cortrak. PMH: CVA in  2022 and 2023, right BKA, HTN, DM-2, HLD, ESRD on HD MWF, asthma, dysphagia, CHF, heart murmur, seizure. Repeat instrumental assessment with MBS.      SLP Plan  Continue with current plan of care      Recommendations for follow up therapy are one component of a multi-disciplinary discharge planning process, led by the attending physician.  Recommendations may be updated based on patient status, additional functional criteria and insurance authorization.    Recommendations  Diet recommendations: Dysphagia 1 (puree);Honey-thick liquid Liquids provided via: Teaspoon (1/2 tsp) Medication Administration: Crushed with puree Supervision: Staff to assist with self feeding;Full supervision/cueing for compensatory strategies Compensations: Slow rate;Small sips/bites;Multiple dry swallows after each bite/sip Postural Changes and/or Swallow Maneuvers: Seated upright 90 degrees;Upright 30-60 min after meal                  Oral care BID   Frequent or constant Supervision/Assistance Dysphagia, unspecified (R13.10);Cognitive communication deficit (I34.742)     Continue with current plan of care     Royce Macadamia  12/21/2023, 8:48 AM

## 2023-12-21 NOTE — Plan of Care (Signed)
 Problem: Coping: Goal: Ability to adjust to condition or change in health will improve Outcome: Not Progressing   Problem: Fluid Volume: Goal: Ability to maintain a balanced intake and output will improve Outcome: Not Progressing   Problem: Health Behavior/Discharge Planning: Goal: Ability to identify and utilize available resources and services will improve Outcome: Not Progressing Goal: Ability to manage health-related needs will improve Outcome: Not Progressing   Problem: Metabolic: Goal: Ability to maintain appropriate glucose levels will improve Outcome: Not Progressing   Problem: Nutritional: Goal: Maintenance of adequate nutrition will improve Outcome: Not Progressing Goal: Progress toward achieving an optimal weight will improve Outcome: Not Progressing   Problem: Skin Integrity: Goal: Risk for impaired skin integrity will decrease Outcome: Not Progressing   Problem: Education: Goal: Knowledge of disease or condition will improve Outcome: Not Progressing Goal: Knowledge of secondary prevention will improve (MUST DOCUMENT ALL) Outcome: Not Progressing Goal: Knowledge of patient specific risk factors will improve (DELETE if not current risk factor) Outcome: Not Progressing   Problem: Ischemic Stroke/TIA Tissue Perfusion: Goal: Complications of ischemic stroke/TIA will be minimized Outcome: Not Progressing   Problem: Coping: Goal: Will verbalize positive feelings about self Outcome: Not Progressing Goal: Will identify appropriate support needs Outcome: Not Progressing   Problem: Health Behavior/Discharge Planning: Goal: Ability to manage health-related needs will improve Outcome: Not Progressing Goal: Goals will be collaboratively established with patient/family Outcome: Not Progressing   Problem: Self-Care: Goal: Ability to participate in self-care as condition permits will improve Outcome: Not Progressing Goal: Verbalization of feelings and concerns  over difficulty with self-care will improve Outcome: Not Progressing Goal: Ability to communicate needs accurately will improve Outcome: Not Progressing   Problem: Nutrition: Goal: Dietary intake will improve Outcome: Not Progressing   Problem: Education: Goal: Knowledge of risk factors and measures for prevention of condition will improve Outcome: Not Progressing   Problem: Coping: Goal: Psychosocial and spiritual needs will be supported Outcome: Not Progressing   Problem: Respiratory: Goal: Will maintain a patent airway Outcome: Not Progressing Goal: Complications related to the disease process, condition or treatment will be avoided or minimized Outcome: Not Progressing   Problem: Education: Goal: Knowledge of General Education information will improve Description: Including pain rating scale, medication(s)/side effects and non-pharmacologic comfort measures Outcome: Not Progressing   Problem: Health Behavior/Discharge Planning: Goal: Ability to manage health-related needs will improve Outcome: Not Progressing   Problem: Clinical Measurements: Goal: Ability to maintain clinical measurements within normal limits will improve Outcome: Not Progressing Goal: Will remain free from infection Outcome: Not Progressing Goal: Diagnostic test results will improve Outcome: Not Progressing Goal: Respiratory complications will improve Outcome: Not Progressing Goal: Cardiovascular complication will be avoided Outcome: Not Progressing   Problem: Activity: Goal: Risk for activity intolerance will decrease Outcome: Not Progressing   Problem: Nutrition: Goal: Adequate nutrition will be maintained Outcome: Not Progressing   Problem: Coping: Goal: Level of anxiety will decrease Outcome: Not Progressing   Problem: Elimination: Goal: Will not experience complications related to bowel motility Outcome: Not Progressing Goal: Will not experience complications related to urinary  retention Outcome: Not Progressing   Problem: Pain Managment: Goal: General experience of comfort will improve and/or be controlled Outcome: Not Progressing   Problem: Safety: Goal: Ability to remain free from injury will improve Outcome: Not Progressing   Problem: Skin Integrity: Goal: Risk for impaired skin integrity will decrease Outcome: Not Progressing   Problem: Activity: Goal: Ability to tolerate increased activity will improve Outcome: Not Progressing   Problem: Respiratory: Goal:  Ability to maintain a clear airway and adequate ventilation will improve Outcome: Not Progressing   Problem: Role Relationship: Goal: Method of communication will improve Outcome: Not Progressing

## 2023-12-21 NOTE — Progress Notes (Signed)
 PROGRESS NOTE                                                                                                                                                                                                             Patient Demographics:    Carolyn Russo, is a 73 y.o. female, DOB - 1951-10-04, UEA:[id number]  Outpatient Primary MD for the patient is Annita Brod, MD    LOS - 35  Admit date - 11/16/2023    Chief Complaint  Patient presents with   Chest Pain   Dialysis pt       Brief Narrative (HPI from H&P)   73 yo female with ESRD on HD MWF, CHF, previous CVA with residual left sided weakness, T2DM, right BKA, and seizure disorder. She presented to [facility] on 1/23 with confusion, left sided chest pain, shortness of breath, and coughing. She was found to be hypoxic on room air and placed on 2 L [place]. CT chest was done without evidence of PE.  MRI revealed a 1.3cm acute to early subacute right PICA infarct. Also was admitted with pneumonia due to COVID and RSV and was started on antibiotics for likely superimposed bacterial infection.    On 1/30 early morning the patient was ~2 hrs into dialysis when her blood pressure dropped. UF goal was decreased but since the patient's level of consciousnesses decreased, she received 300 cc NS bolus. EKG changes were noted and rapid response was activated, dialysis stopped, and blood returned to the patient. Around 6:30 AM the patient was noted to be apneic without a pulse and code blue was called. Patient was noted to have PEA arrest. CPR was done for a few minutes before ROSC was achieved. She was transferred to 6M and patient was intubated due to failure to protect airway.  She continued to be tenuous and minimally responsive, finally she was extubated on 12/03/2023 transferred to my care on 12/09/2023, early in the morning she was fairly stable however within a few hours she had an episode of  emesis witnessed by her husband subsequently developed A-fib RVR and respiratory distress, cardiology PCCM again called bedside and she was transferred to ICU, she stayed there for another 5 days stabilized however still dependent on NG tube feeds, was seen by palliative care.  She was transferred to my service on 12/16/2023 again.  Overall condition report is extremely tenuous, still very high risk for aspiration, still overall extremely weak and deconditioned, family for now wants to pursue all modalities of treatment.  She is currently on NG tube, needs 5-day vacation from DAPT to get a peg tube.   Significant Hospital Events & Prcedures:    1/23 admit to [facility] for acute infarct + covid/rsv pneumonia 1/30 admit to ICU after patient had PEA arrest  2/1 Failed SBT due to apnea. ABG with respiratory alkalosis. RR decreased 2/2 Tolerating SBT. Remains encephalopathic 2/5 Continues to tolerate SBT during day, but remains encephalopathic/unable to protect airway if extubated  2/9 extubated, off precedex 2/12 still lethargic, ABG 2/11 unremarkable, limit centrally acting meds 2/15 transferred to my care, still somnolent on NG tube feeds, aspirated her vomitus, again in respiratory distress, considered back to ICU 12/14/2023 transferred by from ICU, transferred to my service on 12/16/2023 12/19/2023.  Modified barium swallow.  Started on dysphagia 1 diet with honey thick liquids   Unfortunately overall patient continues to appear quite weak and frail, still NG tube feeding dependent, very weak cough reflex, very high risk for recurrent aspiration of oral secretions and regurgitated food material, overall still bedbound, overall prognosis appears poor explained to patient's husband clearly bedside on 12/17/2023.  Called and discussed with son on 12/18/2023 as well.    Subjective:   In bed awake this morning, denies any headache or chest pain, no shortness of breath no cough, appears more awake alert.    Assessment  & Plan :    PEA arrest: due to hypotension during HD, not primarily cardiac in nature, intubated for airway protection and stabilized in ICU, seen by cardiology initially, now stable from cardiac arrest standpoint.  Acute encephalopathy: secondary to post-arrest, stroke, metabolic derangements, and drug induced. Concern for cefepime toxicity given her hx of ESRD, med was discontinued 2/4. MRI brain did show a new and acute central left cerebellar infarct.  Appreciate neurology recs.  Stroke not felt to be significant enough to explain altered mental status, Seroquel and amitriptyline have been stopped, mentation gradually improving now.  Still unable to swallow safely still NG tube bound on 12/16/2023.  Aspiration on 12/09/2023 followed by acute hypoxic respiratory failure with respiratory distress and A-fib RVR.  Treated in ICU, now off of antibiotics, still on NG tube feeds, still has dysphagia, still high risk for aspiration of oral contents, peg tube was decided to be placed in the ICU however family wants to hold off till 2/29/2025 or later.  Acute to early subacute right PICA infarct on 1/24, resolved New acute central left cerebellar infarct  - Appreciate neuro follow up , on DAPT with aspirin and brilinta along with statin, note neuro was consulted as PEG tube was being planned and they are okay holding DAPT for 5 days prior to PEG tube placement, however family wants to hold off on feeding tube till 2/29/2025 or later, they want to continue NG tube feeds for now.    Patient also underwent modified barium swallow on 12/19/2023, for now placed on dysphagia 1 diet with honey thick liquids, monitor with caution if continues to tolerate for 1 to 2 days then will discontinue the NG tube.  - Needs 30 d heart monitor at discharge   Paroxysmal atrial fibrillation with question of possible underlying sick sinus syndrome.  Seen by cardiology.  Currently on oral amiodarone.  Long-term  anticoagulation not recommended by cardiology this admission.  Hypertension  - Currently on hydralazine  via tube.   Leukocytosis Sinusitis - MRI brain noted progressed mastoid effusions, she has finished her treatment with Augmentin   Pneumonia due to COVID and RSV - Productive Cough, subsequently aspiration pneumonia.  Initial treatment for this problem off of antibiotics now.  Will try and keep tube feed rate around 35 cc an hour as she tends to aspirate on higher rate of tube feed.  Abnormal C6 signal MRI showed a nonspecific bone marrow signal of c6 vertebral body - unclear if related to a malignancy vs discitis/osteomyelitis. Will hold off on C spine imaging, as family does not want to pursue this workup and priority is mental status/neuro recovery.    If no other findings, consider C spine MRI to further evaluate   Seizure disorder  - Continue home keppra 500 mg BID seen by neurology this admission.   ESRD on HD MWF - Dialysis per nephro - Discontinued renvela due to low phos levels   Anemia of chronic disease - 1 unit blood on 2/10, Hgb stable - trend CBC - Aranesp every Friday    Diabetes - Continue SSI along with long-acting insulin, dose adjusted on 12/20/2023.  Monitor. - Tube feeds via NG tube along with dysphagia 1 diet for now.  CBG (last 3)  Recent Labs    12/20/23 2338 12/21/23 0415 12/21/23 0731  GLUCAP 107* 145* 100*         Condition - Extremely Guarded  Family Communication  :    Updated son [phone]  in detail on 12/18/2023, wants to hold off on PEG tube till 2/29/2025, called 12/20/2023 at 8:20 AM.  No response.  Husband x 2 on 12/09/23, husband bedside 12/17/2023, called [phone] and left message on 12/20/2023 at 8:24 AM, called [phone] - updated   Code Status :  Full  Consults  :  PCCM, Cards, Pall Care, Renal,     PUD Prophylaxis : PPI   Procedures  :            Disposition Plan  :    Status is: Inpatient    DVT  Prophylaxis  :    SCDs Start: 11/23/23 0716 heparin injection 5,000 Units Start: 11/17/23 0930     Lab Results  Component Value Date   PLT 479 (H) 12/21/2023    Diet :  Diet Order             DIET - DYS 1 Room service appropriate? No; Fluid consistency: Honey Thick  Diet effective now                    Inpatient Medications  Scheduled Meds:  arformoterol  15 mcg Nebulization BID   aspirin  81 mg Oral Daily   budesonide (PULMICORT) nebulizer solution  0.5 mg Nebulization BID   Chlorhexidine Gluconate Cloth  6 each Topical Q0600   Chlorhexidine Gluconate Cloth  6 each Topical Q0600   clopidogrel  75 mg Oral Daily   [START ON 12/25/2023] darbepoetin (ARANESP) injection - DIALYSIS  200 mcg Subcutaneous Q Mon-1800   ferrous sulfate  300 mg Oral Daily   fiber supplement (BANATROL TF)  60 mL Per Tube BID   free water  100 mL Per Tube Q6H   heparin injection (subcutaneous)  5,000 Units Subcutaneous Q8H   hydrALAZINE  50 mg Oral Q8H   insulin aspart  0-15 Units Subcutaneous Q4H   insulin aspart  4 Units Subcutaneous Q4H   insulin glargine  6 Units Subcutaneous Daily  levETIRAcetam  500 mg Oral BID   melatonin  3 mg Oral QHS   multivitamin  1 tablet Oral QHS   nutrition supplement (JUVEN)  1 packet Per Tube BID BM   mouth rinse  15 mL Mouth Rinse Q4H   rosuvastatin  10 mg Oral Daily   sodium chloride flush  3 mL Intravenous Q12H   Continuous Infusions:  feeding supplement (NEPRO CARB STEADY) 25 mL/hr at 12/20/23 1644   PRN Meds:.acetaminophen **OR** acetaminophen, docusate, ipratropium-albuterol, labetalol, ondansetron (ZOFRAN) IV, mouth rinse, polyethylene glycol, Racepinephrine HCl, white petrolatum, witch hazel-glycerin    Objective:   Vitals:   12/21/23 0455 12/21/23 0529 12/21/23 0730 12/21/23 0800  BP:  (!) 105/48 (!) 111/48 (!) 112/54  Pulse: 79 75 74 73  Resp: 19 20 20 20   Temp:  98.4 F (36.9 C)  98.5 F (36.9 C)  TempSrc:  Oral  Oral  SpO2:  100%  100% 100%  Weight:      Height:        Wt Readings from Last 3 Encounters:  12/21/23 68.5 kg  11/01/23 79.5 kg  08/24/23 83 kg     Intake/Output Summary (Last 24 hours) at 12/21/2023 1017 Last data filed at 12/21/2023 0600 Gross per 24 hour  Intake 1000 ml  Output 2000 ml  Net -1000 ml      Physical Exam  Awake and alert this morning, following all commands, NG tube in place, R BKA [place].AT,PERRAL Supple Neck, No JVD,   Symmetrical Chest wall movement, Good air movement bilaterally, bilateral breath sounds RRR,No Gallops,Rubs or new Murmurs,  +ve B.Sounds, Abd Soft, No tenderness,   No Cyanosis, Clubbing or edema     RN pressure injury documentation: Pressure Injury 12/02/23 Sacrum Medial Stage 2 -  Partial thickness loss of dermis presenting as a shallow open injury with a red, pink wound bed without slough. (Active)  12/02/23 1402  Location: Sacrum  Location Orientation: Medial  Staging: Stage 2 -  Partial thickness loss of dermis presenting as a shallow open injury with a red, pink wound bed without slough.  Wound Description (Comments):   Present on Admission: No  Dressing Type Foam - Lift dressing to assess site every shift 12/21/23 0730      Data Review:    Recent Labs  Lab 12/17/23 0535 12/18/23 0539 12/19/23 0543 12/20/23 0459 12/21/23 0455  WBC 18.9* 19.7* 20.5* 19.9* 21.0*  HGB 7.7* 8.1* 7.5* 7.6* 8.2*  HCT 23.2* 23.8* 22.7* 22.5* 24.1*  PLT 494* 545* 533* 509* 479*  MCV 93.5 93.3 93.8 93.0 93.4  MCH 31.0 31.8 31.0 31.4 31.8  MCHC 33.2 34.0 33.0 33.8 34.0  RDW 18.0* 18.1* 18.2* 17.8* 17.8*  LYMPHSABS 2.7 3.0 3.0 2.8 3.8  MONOABS 1.6* 2.3* 2.3* 2.3* 2.9*  EOSABS 0.3 0.4 0.4 0.3 0.2  BASOSABS 0.1 0.1 0.1 0.1 0.1    Recent Labs  Lab 12/16/23 0621 12/17/23 0535 12/18/23 0538 12/18/23 0539 12/18/23 1114 12/19/23 0439 12/20/23 1025  NA 137 136  --  138  --  133* 132*  K 5.4* 5.2*  --  5.5*  --  4.9 4.9  CL 96* 92*  --  98  --  97* 95*   CO2 30 26  --  28  --  27 26  ANIONGAP 11 18*  --  12  --  9 11  GLUCOSE 206* 231*  --  173*  --  158* 137*  BUN 85* 71*  --  94*  --  61* 98*  CREATININE 4.87* 4.46*  --  5.67*  --  3.72* 5.35*  ALBUMIN  --   --   --   --   --  2.4*  --   CRP  --   --   --   --  1.7*  --  4.2*  PROCALCITON  --   --  1.47  --   --   --  2.04  BNP  --   --  138.7*  --   --   --   --   PHOS  --   --   --   --   --  3.6  --   CALCIUM 9.7 10.1  --  9.8  --  9.2 9.0      Recent Labs  Lab 12/16/23 0621 12/17/23 0535 12/18/23 0538 12/18/23 0539 12/18/23 1114 12/19/23 0439 12/20/23 1025  CRP  --   --   --   --  1.7*  --  4.2*  PROCALCITON  --   --  1.47  --   --   --  2.04  BNP  --   --  138.7*  --   --   --   --   CALCIUM 9.7 10.1  --  9.8  --  9.2 9.0    --------------------------------------------------------------------------------------------------------------- Lab Results  Component Value Date   CHOL 90 11/17/2023   HDL 38 (L) 11/17/2023   LDLCALC 37 11/17/2023   TRIG 86 12/02/2023   CHOLHDL 2.4 11/17/2023    Lab Results  Component Value Date   HGBA1C 5.8 (H) 10/26/2023    Micro Results No results found for this or any previous visit (from the past 240 hours).  Radiology Report DG Chest Port 1 View Result Date: 12/21/2023 CLINICAL DATA:  Shortness of breath.  End-stage renal disease. EXAM: PORTABLE CHEST 1 VIEW COMPARISON:  12/20/2023 FINDINGS: Right chest wall dialysis catheter noted with tips in the projection of the right atrium. Feeding tube tip courses below the field of view. Stable cardiomediastinal contours. Persistent opacity within the left lower lung. Right lung is clear. No new changes. IMPRESSION: No change in aeration to the left lower lung. Electronically Signed   By: Signa Kell M.D.   On: 12/21/2023 06:48   DG Chest Port 1 View Result Date: 12/20/2023 CLINICAL DATA:  [id number] SOB (shortness of breath) [id number] EXAM: PORTABLE CHEST 1 VIEW COMPARISON:  Chest x-ray  12/19/2023, CT chest 11/16/2023 FINDINGS: Right chest wall dialysis catheter with tip overlying the right atrium. The heart and mediastinal contours are unchanged. Atherosclerotic plaque. Coronary artery calcification. Retrocardiac airspace opacity. Left base atelectasis. No pulmonary edema. Persistent trace to small left pleural effusion. Possible trace right pleural effusion. No pneumothorax. No acute osseous abnormality. IMPRESSION: 1. Retrocardiac airspace opacity. 2. Persistent trace to small left pleural effusion. 3. Possible trace right pleural effusion. 4.  Aortic Atherosclerosis (ICD10-I70.0). Electronically Signed   By: Tish Frederickson M.D.   On: 12/20/2023 07:01   DG Swallowing Func-Speech Pathology Result Date: 12/19/2023 Table formatting from the original result was not included. Modified Barium Swallow Study Patient Details Name: KAILLY RICHOUX MRN: [id number] Date of Birth: 28-Jan-1951 Today's Date: 12/19/2023 HPI/PMH: HPI: Pt is a 73 y.o. female with who presented from SNF on 11/16/23 for chest pain, weakness and confusion. CT head revealed infarct in inferior right cerebellum which was new since 06/12/22. MRI brain with acute right inferomedial cerebellar ischemic infarction in the right PICA territory. Pt also diagnosed with  COVID/RSV. On 2/15  transferred back to ICU for atrial fibrillation with rapid ventricular response that developed after a gross aspiration event from vomiting. MBS 1/28 DIGEST swallow severity rating of 1, mild. Recommended dys2/thin liquids. Secondary swallows needed to clear pharyngeal residue. Experienced cardiac arrest on 1/30 and was intubated until 2/9 (ten days) and remained NPO thereafter with cortrak. PMH: CVA in 2022 and 2023, right BKA, HTN, DM-2, HLD, ESRD on HD MWF, asthma, dysphagia, CHF, heart murmur, seizure. Repeat instrumental assessment with MBS. Clinical Impression: Clinical Impression: Pt demonstrated improved swallow function compared to FEES on 2/18 and was  adequately awake. Oral impairments include slow lingual transport and mild-mod lingual surface and tongue base residue that spills to to pyriform sinuses followed by spontaneous swallow which cleared. At end of study she had mild vallecular residue with honey thick that cleared with cued second swallow. Pt exhibits decreased timing with one episode of  teaspoon honey thick penetrating before the swallow and aspirated during with strong reflexive cough. Penetrates cleared however shoulder obscuring view of trachea. Residue penetrated x 1 with honey thick (PAS 5) out of multiple trials during the swallow. Teaspoon nectar thick also penetrated prior to laryngeal closure and aspirated during the swallow with a strong reflexive delayed cough (could not detect if ejected from trachea with shoulder obstruction however cleared the vestibule). Solid and thin liquids were not administered and order of bolus presentation of MBS-Imp not followed this study. She was given a DIGEST score of 2 (moderate impairment). Recommend pt initiate a Dys 1 (puree), honey thick liquid given by 1/2 teaspoon amounts only when adequately awake,  second swallow and pt will require full assist/supervision with meals. ST will continue to follow for safety and efficiency. Results relayed to MD and pt's husband via phone. Factors that may increase risk of adverse event in presence of aspiration Rubye Oaks & Clearance Coots 2021): Factors that may increase risk of adverse event in presence of aspiration Rubye Oaks & Clearance Coots 2021): Reduced cognitive function Recommendations/Plan: Swallowing Evaluation Recommendations Swallowing Evaluation Recommendations Recommendations: PO diet PO Diet Recommendation: Dysphagia 1 (Pureed); Moderately thick liquids (Level 3, honey thick) (1/2 tsp with honey) Liquid Administration via: Spoon (1/2 teaspoon) Medication Administration: Crushed with puree Supervision: Staff to assist with self-feeding; Full supervision/cueing for  swallowing strategies Swallowing strategies  : Minimize environmental distractions; Slow rate; Small bites/sips; Multiple dry swallows after each bite/sip Postural changes: Position pt fully upright for meals Oral care recommendations: Oral care BID (2x/day) Treatment Plan Treatment Plan Treatment recommendations: Therapy as outlined in treatment plan below Follow-up recommendations: Skilled nursing-short term rehab (<3 hours/day) Functional status assessment: Patient has had a recent decline in their functional status and demonstrates the ability to make significant improvements in function in a reasonable and predictable amount of time. Treatment frequency: Min 2x/week Treatment duration: 2 weeks Interventions: Aspiration precaution training; Patient/family education; Diet toleration management by SLP Recommendations Recommendations for follow up therapy are one component of a multi-disciplinary discharge planning process, led by the attending physician.  Recommendations may be updated based on patient status, additional functional criteria and insurance authorization. Assessment: Orofacial Exam: Orofacial Exam Oral Cavity: Oral Hygiene: WFL Oral Cavity - Dentition: Adequate natural dentition Orofacial Anatomy: WFL Oral Motor/Sensory Function: WFL Anatomy: Anatomy: Suspected cervical osteophytes Boluses Administered: Boluses Administered Boluses Administered: Mildly thick liquids (Level 2, nectar thick); Moderately thick liquids (Level 3, honey thick); Puree  Oral Impairment Domain: Oral Impairment Domain Lip Closure: No labial escape Tongue control during bolus hold: -- (unable to  hold bolus) Bolus preparation/mastication: -- (did not give solid) Bolus transport/lingual motion: Slow tongue motion Oral residue: Residue collection on oral structures Location of oral residue : Tongue Initiation of pharyngeal swallow : Valleculae; Pyriform sinuses  Pharyngeal Impairment Domain: Pharyngeal Impairment Domain Soft  palate elevation: No bolus between soft palate (SP)/pharyngeal wall (PW) Laryngeal elevation: Complete superior movement of thyroid cartilage with complete approximation of arytenoids to epiglottic petiole Anterior hyoid excursion: Partial anterior movement Epiglottic movement: Complete inversion Laryngeal vestibule closure: Incomplete, narrow column air/contrast in laryngeal vestibule Pharyngeal stripping wave : Present - complete Pharyngeal contraction (A/P view only): N/A Pharyngoesophageal segment opening: Complete distension and complete duration, no obstruction of flow Tongue base retraction: Narrow column of contrast or air between tongue base and PPW Pharyngeal residue: Collection of residue within or on pharyngeal structures Location of pharyngeal residue: Valleculae  Esophageal Impairment Domain: Esophageal Impairment Domain Esophageal clearance upright position: Complete clearance, esophageal coating Pill: No data recorded Penetration/Aspiration Scale Score: Penetration/Aspiration Scale Score 1.  Material does not enter airway: Puree 5.  Material enters airway, CONTACTS cords and not ejected out: Mildly thick liquids (Level 2, nectar thick); Moderately thick liquids (Level 3, honey thick) 6.  Material enters airway, passes BELOW cords then ejected out: Moderately thick liquids (Level 3, honey thick) 7.  Material enters airway, passes BELOW cords and not ejected out despite cough attempt by patient: Mildly thick liquids (Level 2, nectar thick) Compensatory Strategies: Compensatory Strategies Compensatory strategies: Yes Multiple swallows: Effective Effective Multiple Swallows: Moderately thick liquid (Level 3, honey thick)   General Information: Caregiver present: No  Diet Prior to this Study: NPO; Cortrak/Small bore NG tube   Temperature : Normal   Respiratory Status: WFL   Supplemental O2: Nasal cannula   History of Recent Intubation: Yes  Behavior/Cognition: Alert; Cooperative; Pleasant mood; Requires  cueing Self-Feeding Abilities: Needs assist with self-feeding Baseline vocal quality/speech: Dysphonic No data recorded No data recorded Exam Limitations: Limited visibility (with shoulder to determine if aspirate cleared out with cough) Goal Planning: Prognosis for improved oropharyngeal function: -- (fair-good) Barriers to Reach Goals: Cognitive deficits No data recorded No data recorded Consulted and agree with results and recommendations: Patient; Family member/caregiver Pain: Pain Assessment Pain Assessment: No/denies pain Faces Pain Scale: 0 Breathing: 0 Negative Vocalization: 0 Facial Expression: 0 Body Language: 0 Consolability: 0 PAINAD Score: 0 Facial Expression: 0 Body Movements: 0 Muscle Tension: 0 Compliance with ventilator (intubated pts.): N/A Vocalization (extubated pts.): 0 CPOT Total: 0 End of Session: Start Time:SLP Start Time (ACUTE ONLY): 1220 Stop Time: SLP Stop Time (ACUTE ONLY): 1241 Time Calculation:SLP Time Calculation (min) (ACUTE ONLY): 21 min Charges: SLP Evaluations $ SLP Speech Visit: 1 Visit SLP Evaluations $MBS Swallow: 1 Procedure $Swallowing Treatment: 1 Procedure SLP visit diagnosis: SLP Visit Diagnosis: Dysphagia, oropharyngeal phase (R13.12) Past Medical History: Past Medical History: Diagnosis Date  Acute GI bleeding   Allergy   Anemia   Anterior chest wall pain   Appendicitis 1965  Asthma   Body mass index 37.0-37.9, adult   Breast pain   Cataract   both eyes  CHF (congestive heart failure) (HCC)   Cognitive change 04/20/2021  r/t cva 03/2821  Complication of anesthesia   Memory loss after general  Dehydration 2014  Deviated septum 1971  Diabetes mellitus   Dysphagia due to old stroke   easy to get strangled when eating  Dyspnea 2014  ESRD on hemodialysis (HCC)   MWF at [facility]  Extrinsic asthma   WITH ASTHMA  ATTACK  Fibroid 1980  GERD (gastroesophageal reflux disease)   Heart murmur   History of migraine   History of seizure   with stroke  Hx gestational diabetes    Hyperlipidemia   Hypertension 2014  Inguinal hernia 1959  Malaise and fatigue 2014  Non-IgE mediated allergic asthma 2014  Obesity   Pelvic pain   Pregnancy, high-risk 1985  Stroke (HCC) 04/20/2021  (CVA) of right basal ganglia  Tonsillitis 1968  Uterine fibroid 1980  Visual field defect   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 Past Surgical History: Past Surgical History: Procedure Laterality Date  ABDOMINAL AORTOGRAM W/LOWER EXTREMITY N/A 02/21/2022  Procedure: ABDOMINAL AORTOGRAM W/LOWER EXTREMITY;  Surgeon: Maeola Harman, MD;  Location: [facility] INVASIVE CV LAB;  Service: Cardiovascular;  Laterality: N/A;  AMPUTATION Right 05/18/2022  Procedure: RIGHT BELOW KNEE AMPUTATION;  Surgeon: Nadara Mustard, MD;  Location: [facility] OR;  Service: Orthopedics;  Laterality: Right;  AMPUTATION TOE Right 04/15/2022  Procedure: AMPUTATION  LST TOERIGHT FOOT;  Surgeon: Edwin Cap, DPM;  Location: [facility];  Service: Podiatry;  Laterality: Right;  APPENDECTOMY  1959  BASCILIC VEIN TRANSPOSITION Right 04/30/2021  Procedure: RIGHT FIRST STAGE BASCILIC VEIN TRANSPOSITION;  Surgeon: Chuck Hint, MD;  Location: [facility] OR;  Service: Vascular;  Laterality: Right;  CESAREAN SECTION  1985  COLONOSCOPY    ESOPHAGOGASTRODUODENOSCOPY (EGD) WITH PROPOFOL N/A 04/18/2021  Procedure: ESOPHAGOGASTRODUODENOSCOPY (EGD) WITH PROPOFOL;  Surgeon: Iva Boop, MD;  Location: [facility] ENDOSCOPY;  Service: Endoscopy;  Laterality: N/A;  EYE SURGERY    bilateral cataract   FLEXIBLE SIGMOIDOSCOPY N/A 04/18/2021  Procedure: FLEXIBLE SIGMOIDOSCOPY;  Surgeon: Iva Boop, MD;  Location: [facility] ENDOSCOPY;  Service: Endoscopy;  Laterality: N/A;  HERNIA REPAIR  1959  IR FLUORO GUIDE CV LINE RIGHT  03/18/2021  IR US GUIDE VASC ACCESS RIGHT  03/18/2021  LEFT HEART CATH AND CORONARY ANGIOGRAPHY N/A 04/04/2017  Procedure: Left Heart Cath and Coronary Angiography;  Surgeon: Lyn Records, MD;  Location: [facility] INVASIVE CV LAB;  Service: Cardiovascular;  Laterality: N/A;  MYOMECTOMY  1980,  2004, 2007  PERIPHERAL VASCULAR THROMBECTOMY  02/21/2022  Procedure: PERIPHERAL VASCULAR THROMBECTOMY;  Surgeon: Maeola Harman, MD;  Location: [facility] INVASIVE CV LAB;  Service: Cardiovascular;;  Right Tibial  RHINOPLASTY  1971  ROTATOR CUFF REPAIR  2003  SURGICAL REPAIR OF HEMORRHAGE  2015  TONSILLECTOMY  [address] Royce Macadamia 12/19/2023, 3:06 PM    Signature  -   Susa Raring M.D on 12/21/2023 at 10:17 AM   -  To page go to [email or web address]

## 2023-12-21 NOTE — Plan of Care (Signed)
  Problem: Coping: Goal: Ability to adjust to condition or change in health will improve Outcome: Progressing   Problem: Fluid Volume: Goal: Ability to maintain a balanced intake and output will improve Outcome: Progressing   Problem: Nutritional: Goal: Maintenance of adequate nutrition will improve Outcome: Progressing   Problem: Skin Integrity: Goal: Risk for impaired skin integrity will decrease Outcome: Progressing   Problem: Nutrition: Goal: Dietary intake will improve Outcome: Progressing   Problem: Clinical Measurements: Goal: Will remain free from infection Outcome: Progressing

## 2023-12-21 NOTE — Progress Notes (Signed)
 Physical Therapy Treatment Patient Details Name: Carolyn Russo MRN: [id number] DOB: 1951/05/27 Today's Date: 12/21/2023   History of Present Illness Pt is a 73 yo female presenting to [facility] on 11/16/23 for chest pain, weakness, and confusion. CT head revealed infarct in the inferior right cerebellum. MRI brain with acute right inferomedial cerebellar ischemic infarction in the right PICA territory. Pt also diagnosed with Covid/RSV. PEA arrest 1/30. New L cerebellar infarct found on MRI 2/5. Extubated 2/9. 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 Rolled on her left side +2 total and pt with both eyes open and maintained alertness while sitting EOB~12 minutes. Performed trunk rotation with limited cervical rotation to each side multiple times; provided support to lower spine and upper spine extension stretch to enable pt to lift her head better. Patient able to initiate reaching with RUE on command (with delay) and even reaching to the right outside her BOS with CGA to her torso. Unable to work on transfer to chair today due to lack of chair cushion (asked Diplomatic Services operational officer to order for pt). Based on how alert she was sitting at EOB, feel she may be able to use RLE with prosthesis to help with scooting transfer. If not as alert, could be lifted to chair to begin working on upright sitting tolerance for HD.     If plan is discharge home, recommend the following: Two people to help with walking and/or transfers;Two people to help with bathing/dressing/bathroom;Assistance with cooking/housework;Direct supervision/assist for medications management;Help with stairs or ramp for entrance   Can travel by private vehicle     No  Equipment Recommendations  Michiel Sites lift    Recommendations for Other Services       Precautions / Restrictions  Precautions Precautions: Fall Recall of Precautions/Restrictions: Impaired Precaution/Restrictions Comments: Monitor O2/RR, cortrak, L hemi Required Braces or Orthoses: Other Brace Other Brace: RLE prosthetic in room Restrictions Weight Bearing Restrictions Per Provider Order: No RLE Weight Bearing Per Provider Order:  (BKA has prostectic) Other Position/Activity Restrictions: R BKA- prosthetic in room     Mobility  Bed Mobility Overal bed mobility: Needs Assistance Bed Mobility: Sit to Supine, Rolling, Sidelying to Sit Rolling: Total assist, +2 for safety/equipment, +2 for physical assistance Sidelying to sit: Total assist, +2 for physical assistance, HOB elevated   Sit to supine: Max assist, +2 for physical assistance   General bed mobility comments: pt briefly opening eyes prior to rolling, then became alert with eyes open remainder of session; coming up from Left/weak side difficult for pt to assist; on return to supine she helped control descent of her torso as legs lifted onto bed    Transfers                   General transfer comment: no cushion for chair (asked secretary to order as pt with sacral wound); as alert as she became EOB, ?she can assist with scoot/sliding board transfer to chair    Ambulation/Gait                   Stairs             Wheelchair Mobility     Tilt Bed    Modified Rankin (Stroke Patients Only) Modified Rankin (Stroke Patients Only) Pre-Morbid Rankin Score: Moderately severe disability  Modified Rankin: Severe disability     Balance Overall balance assessment: Needs assistance Sitting-balance support: Single extremity supported, Feet supported Sitting balance-Leahy Scale: Poor Sitting balance - Comments: min A at most with up to 15 seconds of CGA numerous times during session                                    Communication Communication Communication: Impaired Factors Affecting  Communication: Difficulty expressing self  Cognition Arousal: Lethargic, Alert Behavior During Therapy: Flat affect   PT - Cognitive impairments: Difficult to assess Difficult to assess due to: Impaired communication, Level of arousal                     PT - Cognition Comments: pt inconsistently follows commands with R side, requries increased time for processing and response Following commands: Impaired Following commands impaired: Follows one step commands with increased time    Cueing Cueing Techniques: Verbal cues, Gestural cues, Tactile cues, Visual cues  Exercises General Exercises - Lower Extremity Ankle Circles/Pumps: PROM, Left, 5 reps, Seated Long Arc Quad: AROM, Right, 5 reps, Seated Other Exercises Other Exercises: PROM knee extension in sitting with visible contraction of left quad x1 but could not repeat    General Comments General comments (skin integrity, edema, etc.): Husband arrived at beginning of session. Prior to hospitalization she was wearing prosthesis and working on sit to stand and transfers      Pertinent Vitals/Pain Pain Assessment Pain Assessment: Faces Faces Pain Scale: Hurts a little bit Pain Location: stated ouch x1 with supine to sit but could not state where she hurt, then denied pain Pain Descriptors / Indicators: Discomfort, Moaning Pain Intervention(s): Limited activity within patient's tolerance, Monitored during session, Repositioned    Home Living                          Prior Function            PT Goals (current goals can now be found in the care plan section) Acute Rehab PT Goals Patient Stated Goal: return to rehab to improve mobility PT Goal Formulation: Patient unable to participate in goal setting Time For Goal Achievement: 12/29/23 Potential to Achieve Goals: Poor Progress towards PT goals: Progressing toward goals    Frequency    Min 1X/week      PT Plan      Co-evaluation PT/OT/SLP  Co-Evaluation/Treatment: Yes Reason for Co-Treatment: For patient/therapist safety;Complexity of the patient's impairments (multi-system involvement);Necessary to address cognition/behavior during functional activity;To address functional/ADL transfers PT goals addressed during session: Mobility/safety with mobility;Balance;Strengthening/ROM        AM-PAC PT "6 Clicks" Mobility   Outcome Measure  Help needed turning from your back to your side while in a flat bed without using bedrails?: Total Help needed moving from lying on your back to sitting on the side of a flat bed without using bedrails?: Total Help needed moving to and from a bed to a chair (including a wheelchair)?: Total Help needed standing up from a chair using your arms (e.g., wheelchair or bedside chair)?: Total Help needed to walk in hospital room?: Total Help needed climbing 3-5 steps with a railing? : Total 6 Click Score: 6    End of Session   Activity Tolerance: Patient limited by fatigue Patient left: with call bell/phone within reach;in bed;with bed alarm set;with family/visitor present  Nurse Communication: Mobility status;Other (comment) (need to be cleaned) PT Visit Diagnosis: Muscle weakness (generalized) (M62.81);Difficulty in walking, not elsewhere classified (R26.2);Other symptoms and signs involving the nervous system (R29.898);Hemiplegia and hemiparesis Hemiplegia - Right/Left: Left Hemiplegia - caused by: Cerebral infarction     Time: 4098-1191 PT Time Calculation (min) (ACUTE ONLY): 32 min  Charges:    $Therapeutic Activity: 8-22 mins PT General Charges $$ ACUTE PT VISIT: 1 Visit                      Jerolyn Center, PT Acute Rehabilitation Services  Office [phone]    Zena Amos 12/21/2023, 1:56 PM

## 2023-12-21 NOTE — Plan of Care (Signed)
  Problem: Coping: Goal: Ability to adjust to condition or change in health will improve Outcome: Progressing   Problem: Health Behavior/Discharge Planning: Goal: Ability to manage health-related needs will improve Outcome: Progressing   Problem: Metabolic: Goal: Ability to maintain appropriate glucose levels will improve Outcome: Progressing   Problem: Skin Integrity: Goal: Risk for impaired skin integrity will decrease Outcome: Progressing   Problem: Ischemic Stroke/TIA Tissue Perfusion: Goal: Complications of ischemic stroke/TIA will be minimized Outcome: Progressing   Problem: Self-Care: Goal: Ability to communicate needs accurately will improve Outcome: Progressing

## 2023-12-21 NOTE — Progress Notes (Signed)
 [facility] Progress Note   Subjective:    Last HD on 2/26 with 2 kg UF. She is nonverbal on exam today.  I called her husband.  We reflected on all that his wife had been through and he shared his experiences over the past couple of months.  His goals are that he would like to see if she is able to get off tube feeds and get up out of bed.   Review of systems:    Unable to obtain secondary to AMS, not following commands for me   Objective Vitals:   12/21/23 0455 12/21/23 0529 12/21/23 0730 12/21/23 0800  BP:  (!) 105/48 (!) 111/48 (!) 112/54  Pulse: 79 75 74 73  Resp: 19 20 20 20   Temp:  98.4 F (36.9 C)  98.5 F (36.9 C)  TempSrc:  Oral  Oral  SpO2:  100% 100% 100%  Weight:      Height:       Physical Exam  General elderly female chronically ill appearing; in bed in no acute distress HEENT normocephalic atraumatic extraocular movements intact sclera anicteric Neck supple trachea midline Lungs occ rhonchi to auscultation bilaterally normal work of breathing at rest on oxygen Heart S1S2 no rub Abdomen soft nontender nondistended Extremities no edema appreciated; right BKA  Psych no anxiety or agitation Neuro -nonverbal; opens eyes to exam. Does not answer questions  Access RIJ tunn catheter; right AVF with bruit and thrill     Dialysis Orders: MWF [facility]  4h   B350    75.5kg   2/2 bath   R fem TDC  Heparin 2600 + 1400 midrun - last HD 1/22, getting to dry wt - venofer 100 qhd thru 1/31 - mircera 150 q2, last 1/17, due 1/31  Renal-related home meds: - rocaltrol 0.5 mwf - sensipar 30 every day - hydralazine 100 tid - losartan 50 bid - renvela 800mg  ac tid - others: brilinta, crestor, keppra, ritalin, neurontin, asa, PPI, dulera, insulin, albuterol, elavil  CXR 2/18, 2/22 - small R effusion, no edema/ congestion   Assessment/Plan: Acute hypoxic respiratory failure - multifactorial with +COVID/ +RSV pna/ volume^: resolved and S/P course of abx + HD.   CVA: R then L cerebellar infarcts d/t small vessel disease. Per neurology.  On aspirin and plavix for now  AMS: multifactorial. Slow improvement per charting.  Aspiration pneumonitis - noted, was moved to the ICU again for same and now back on floor. Speech is following  ESRD: on HD MWF schedule.  HTN - controlled  Anemia of esrd: Last ESA on 2/17.  Increased aranesp to 200 mcg every Monday.  Tsat 26% and ferritin 1638.  Getting po iron. Transfuse as needed Secondary hyperparathyroidism -  phos ok. On nepro.    H/o seizure d/o - on Keppra PEA arrest - noted recent complicated history   Disposition - per primary team   Recent Labs  Lab 12/19/23 0439 12/19/23 0543 12/20/23 0459 12/20/23 1025 12/21/23 0455  HGB  --    < > 7.6*  --  8.2*  ALBUMIN 2.4*  --   --   --   --   CALCIUM 9.2  --   --  9.0  --   PHOS 3.6  --   --   --   --   CREATININE 3.72*  --   --  5.35*  --   K 4.9  --   --  4.9  --    < > =  values in this interval not displayed.    Inpatient medications:  arformoterol  15 mcg Nebulization BID   aspirin  81 mg Oral Daily   budesonide (PULMICORT) nebulizer solution  0.5 mg Nebulization BID   Chlorhexidine Gluconate Cloth  6 each Topical Q0600   Chlorhexidine Gluconate Cloth  6 each Topical Q0600   clopidogrel  75 mg Oral Daily   [START ON 12/25/2023] darbepoetin (ARANESP) injection - DIALYSIS  200 mcg Subcutaneous Q Mon-1800   ferrous sulfate  300 mg Oral Daily   fiber supplement (BANATROL TF)  60 mL Per Tube BID   free water  100 mL Per Tube Q6H   heparin injection (subcutaneous)  5,000 Units Subcutaneous Q8H   hydrALAZINE  50 mg Oral Q8H   insulin aspart  0-15 Units Subcutaneous Q4H   insulin aspart  4 Units Subcutaneous Q4H   insulin glargine  6 Units Subcutaneous Daily   levETIRAcetam  500 mg Oral BID   melatonin  3 mg Oral QHS   multivitamin  1 tablet Oral QHS   nutrition supplement (JUVEN)  1 packet Per Tube BID BM   mouth rinse  15 mL Mouth Rinse Q4H    rosuvastatin  10 mg Oral Daily   sodium chloride flush  3 mL Intravenous Q12H    feeding supplement (NEPRO CARB STEADY) 25 mL/hr at 12/20/23 1644   acetaminophen **OR** acetaminophen, docusate, ipratropium-albuterol, labetalol, ondansetron (ZOFRAN) IV, mouth rinse, polyethylene glycol, Racepinephrine HCl, white petrolatum, witch hazel-glycerin       Estanislado Emms, MD 12:59 PM 12/21/2023

## 2023-12-21 NOTE — Progress Notes (Signed)
 Occupational Therapy Treatment Patient Details Name: Carolyn Russo MRN: [id number] DOB: Jul 19, 1951 Today's Date: 12/21/2023   History of present illness Pt is a 73 yo female presenting to [facility] on 11/16/23 for chest pain, weakness, and confusion. CT head revealed infarct in the inferior right cerebellum. MRI brain with acute right inferomedial cerebellar ischemic infarction in the right PICA territory. Pt also diagnosed with Covid/RSV. PEA arrest 1/30. New L cerebellar infarct found on MRI 2/5. Extubated 2/9. 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  Husband not present.  Repositioned patient in bed and performed PROM to L hand.  Issued a palm protector to wear at all times except for bathing or exercise.  Patient will benefit from continued inpatient follow up therapy, <3 hours/day       If plan is discharge home, recommend the following:  A lot of help with bathing/dressing/bathroom;Two people to help with walking and/or transfers;Assistance with cooking/housework;Direct supervision/assist for medications management;Assistance with feeding;Direct supervision/assist for financial management;Assist for transportation;Help with stairs or ramp for entrance   Equipment Recommendations  Hoyer lift;Hospital bed    Recommendations for Other Services      Precautions / Restrictions Precautions Precautions: Fall Recall of Precautions/Restrictions: Impaired Precaution/Restrictions Comments: Monitor O2/RR, cortrak, L hemi Required Braces or Orthoses: Other Brace Other Brace: RLE prosthetic in room Restrictions Weight Bearing Restrictions Per Provider Order: No       Mobility Bed Mobility Overal bed mobility: Needs Assistance Bed Mobility: Sit to Supine, Rolling, Sidelying to Sit Rolling: Total assist, +2 for safety/equipment, +2 for physical assistance Sidelying to  sit: Total assist, +2 for physical assistance, HOB elevated   Sit to supine: Max assist, +2 for physical assistance   General bed mobility comments: repositioned patient in bed for better position of head, neck and LUE    Transfers                         Balance Overall balance assessment: Needs assistance Sitting-balance support: Single extremity supported, Feet supported Sitting balance-Leahy Scale: Poor Sitting balance - Comments: min A at most with up to 15 seconds of CGA numerous times during session                                   ADL either performed or assessed with clinical judgement   ADL Overall ADL's : Needs assistance/impaired                                     Functional mobility during ADLs: +2 for physical assistance;Total assistance General ADL Comments: Forward flexed posture in sitting (posterior tilt, thorasic flex and head forward and down)    Extremity/Trunk Assessment Upper Extremity Assessment Upper Extremity Assessment: LUE deficits/detail RUE Deficits / Details: Patient reaching with RUE forward and to the side, scratching head LUE Deficits / Details: patient in clenched hand position (issued a palm protector to be worn at all times except for during bathing or exercise)   Lower Extremity Assessment Lower Extremity Assessment: Defer to PT evaluation        Vision       Perception Perception Perception: Impaired Preception Impairment Details: Inattention/Neglect Perception-Other Comments:  L inattention   Praxis     Communication Communication Communication: Impaired Factors Affecting Communication: Difficulty expressing self   Cognition Arousal: Lethargic (sleeping) Behavior During Therapy: Flat affect   Difficult to assess due to: Impaired communication           OT - Cognition Comments: Only says occassional words with prompting                 Following commands:  Impaired Following commands impaired: Follows multi-step commands with increased time (and repeatition)      Cueing   Cueing Techniques: Verbal cues, Gestural cues, Tactile cues, Visual cues  Exercises Other Exercises Other Exercises: In sittiing worked on rotation R/L x3 Other Exercises: Reaching with RUE forward and up in sitting x5    Shoulder Instructions       General Comments Husband not present when returned to room    Pertinent Vitals/ Pain       Pain Assessment Pain Assessment: No/denies pain Faces Pain Scale: Hurts a little bit Pain Location: stated ouch x1 with supine to sit but could not state where she hurt, then denied pain Pain Descriptors / Indicators: Discomfort, Moaning Pain Intervention(s): Limited activity within patient's tolerance  Home Living                                          Prior Functioning/Environment              Frequency  Min 1X/week        Progress Toward Goals  OT Goals(current goals can now be found in the care plan section)  Progress towards OT goals: Progressing toward goals  Acute Rehab OT Goals OT Goal Formulation: Patient unable to participate in goal setting Time For Goal Achievement: 01/04/24 Potential to Achieve Goals: Fair ADL Goals Pt Will Perform Grooming: with mod assist;sitting Pt Will Perform Lower Body Bathing: sitting/lateral leans;with min assist Pt Will Perform Upper Body Dressing: with min assist;sitting Pt Will Perform Lower Body Dressing: with mod assist;sitting/lateral leans Pt Will Transfer to Toilet: with min assist;with +2 assist;stand pivot transfer;bedside commode Pt/caregiver will Perform Home Exercise Program: Increased strength;Increased ROM;Both right and left upper extremity;With written HEP provided Additional ADL Goal #1: In sitting patient will be able to hold head up with mod A and mod cues for 2 minutes to engage in an activity Additional ADL Goal #2: In sitting  patient will be able to use RUE actively to assist with rolling, sitting balance or scooting with moderate verbal and tactile cueing  Plan      Co-evaluation    PT/OT/SLP Co-Evaluation/Treatment: Yes Reason for Co-Treatment: For patient/therapist safety;Complexity of the patient's impairments (multi-system involvement);Necessary to address cognition/behavior during functional activity;To address functional/ADL transfers PT goals addressed during session: Mobility/safety with mobility;Balance;Strengthening/ROM OT goals addressed during session: Other (comment) (palm protector)      AM-PAC OT "6 Clicks" Daily Activity     Outcome Measure   Help from another person eating meals?: Total Help from another person taking care of personal grooming?: Total Help from another person toileting, which includes using toliet, bedpan, or urinal?: Total Help from another person bathing (including washing, rinsing, drying)?: Total Help from another person to put on and taking off regular upper body clothing?: Total Help from another person to put on and taking off regular lower body clothing?: Total 6 Click Score: 6  End of Session Equipment Utilized During Treatment: Oxygen  OT Visit Diagnosis: Muscle weakness (generalized) (M62.81);Other symptoms and signs involving cognitive function;Pain;Other abnormalities of gait and mobility (R26.89);Unsteadiness on feet (R26.81);Other (comment) Hemiplegia - Right/Left: Left Hemiplegia - dominant/non-dominant: Non-Dominant Hemiplegia - caused by: Cerebral infarction   Activity Tolerance Patient tolerated treatment well   Patient Left in bed;with call bell/phone within reach;with bed alarm set   Nurse Communication Other (comment) (Issuing of palm protector)        Time: 7829-5621 OT Time Calculation (min): 10 min  Charges: OT General Charges $OT Visit: 1 Visit OT Treatments $Therapeutic Activity: 8-22 mins  Hal Neer OTR/L   Malachi Bonds 12/21/2023, 4:39 PM

## 2023-12-21 NOTE — Progress Notes (Signed)
 Occupational Therapy Treatment Patient Details Name: Carolyn Russo MRN: [id number] DOB: August 21, 1951 Today's Date: 12/21/2023   History of present illness Pt is a 73 yo female presenting to [facility] on 11/16/23 for chest pain, weakness, and confusion. CT head revealed infarct in the inferior right cerebellum. MRI brain with acute right inferomedial cerebellar ischemic infarction in the right PICA territory. Pt also diagnosed with Covid/RSV. PEA arrest 1/30. New L cerebellar infarct found on MRI 2/5. Extubated 2/9. PMH: R BKA on 7/26, DM2, HTN, HLD, ESRD on HD MWF, hx of CVA 03/2021 & 05/2022, asthma, CHF, dysphagia, heart murmur, seizure   OT comments  Co-treat with PT for EOB activity.  Husband present and helpful with arousing patient for treatment as she was very lethargic.  At EOB worked on posture, rotation and sitting balance.  Overall total assist +2 for bed mobility, but once up she was alert and following 1 step commands.  Goals updated today Patient will benefit from continued inpatient follow up therapy, <3 hours/day and continued treatment while on acute       If plan is discharge home, recommend the following:  A lot of help with bathing/dressing/bathroom;Two people to help with walking and/or transfers;Assistance with cooking/housework;Direct supervision/assist for medications management;Assistance with feeding;Direct supervision/assist for financial management;Assist for transportation;Help with stairs or ramp for entrance   Equipment Recommendations  Hoyer lift;Hospital bed    Recommendations for Other Services      Precautions / Restrictions Precautions Precautions: Fall Recall of Precautions/Restrictions: Impaired Precaution/Restrictions Comments: Monitor O2/RR, cortrak, L hemi Required Braces or Orthoses: Other Brace Other Brace: RLE prosthetic in room Restrictions Weight Bearing Restrictions Per Provider Order: No       Mobility Bed Mobility Overal bed mobility: Needs  Assistance Bed Mobility: Sit to Supine, Rolling, Sidelying to Sit Rolling: Total assist, +2 for safety/equipment, +2 for physical assistance Sidelying to sit: Total assist, +2 for physical assistance, HOB elevated   Sit to supine: Max assist, +2 for physical assistance        Transfers                         Balance Overall balance assessment: Needs assistance Sitting-balance support: Single extremity supported, Feet supported Sitting balance-Leahy Scale: Poor Sitting balance - Comments: min A at most with up to 15 seconds of CGA numerous times during session                                   ADL either performed or assessed with clinical judgement   ADL Overall ADL's : Needs assistance/impaired                                     Functional mobility during ADLs: +2 for physical assistance;Total assistance General ADL Comments: Forward flexed posture in sitting (posterior tilt, thorasic flex and head forward and down)    Extremity/Trunk Assessment Upper Extremity Assessment Upper Extremity Assessment: Right hand dominant RUE Deficits / Details: Patient reaching with RUE forward and to the side, scratching head LUE Deficits / Details: No AROM noted, initially R hand clenched and fingers digging into palm, able to passsively range into ext.  Flexor tone noted with moving supine to sit   Lower Extremity Assessment Lower Extremity Assessment: Defer to PT evaluation  Vision       Perception Perception Perception: Impaired Preception Impairment Details: Inattention/Neglect Perception-Other Comments: L inattention   Praxis     Communication Communication Communication: Impaired Factors Affecting Communication: Difficulty expressing self   Cognition Arousal: Lethargic, Alert Behavior During Therapy: Flat affect   Difficult to assess due to: Impaired communication           OT - Cognition Comments: Only says  occassional words with prompting                 Following commands: Impaired Following commands impaired: Follows multi-step commands with increased time (and repeatition)      Cueing   Cueing Techniques: Verbal cues, Gestural cues, Tactile cues, Visual cues  Exercises Other Exercises Other Exercises: In sittiing worked on rotation R/L x3 Other Exercises: Reaching with RUE forward and up in sitting x5    Shoulder Instructions       General Comments Husband present and very encouraging to patient    Pertinent Vitals/ Pain       Pain Assessment Faces Pain Scale: Hurts a little bit Pain Location: stated ouch x1 with supine to sit but could not state where she hurt, then denied pain Pain Descriptors / Indicators: Discomfort, Moaning Pain Intervention(s): Limited activity within patient's tolerance  Home Living                                          Prior Functioning/Environment              Frequency  Min 1X/week        Progress Toward Goals  OT Goals(current goals can now be found in the care plan section)  Progress towards OT goals: Progressing toward goals  Acute Rehab OT Goals OT Goal Formulation: Patient unable to participate in goal setting Time For Goal Achievement: 01/04/24 Potential to Achieve Goals: Fair ADL Goals Pt Will Perform Grooming: with mod assist;sitting Pt Will Perform Lower Body Bathing: sitting/lateral leans;with min assist Pt Will Perform Upper Body Dressing: with min assist;sitting Pt Will Perform Lower Body Dressing: with mod assist;sitting/lateral leans Pt Will Transfer to Toilet: with min assist;with +2 assist;stand pivot transfer;bedside commode Pt/caregiver will Perform Home Exercise Program: Increased strength;Increased ROM;Both right and left upper extremity;With written HEP provided Additional ADL Goal #1: In sitting patient will be able to hold head up with mod A and mod cues for 2 minutes to engage in  an activity Additional ADL Goal #2: In sitting patient will be able to use RUE actively to assist with rolling, sitting balance or scooting with moderate verbal and tactile cueing  Plan      Co-evaluation    PT/OT/SLP Co-Evaluation/Treatment: Yes Reason for Co-Treatment: For patient/therapist safety;Complexity of the patient's impairments (multi-system involvement);Necessary to address cognition/behavior during functional activity;To address functional/ADL transfers PT goals addressed during session: Mobility/safety with mobility;Balance;Strengthening/ROM OT goals addressed during session: ADL's and self-care;Strengthening/ROM      AM-PAC OT "6 Clicks" Daily Activity     Outcome Measure   Help from another person eating meals?: Total Help from another person taking care of personal grooming?: Total Help from another person toileting, which includes using toliet, bedpan, or urinal?: Total Help from another person bathing (including washing, rinsing, drying)?: Total Help from another person to put on and taking off regular upper body clothing?: Total Help from another person to put on and taking  off regular lower body clothing?: Total 6 Click Score: 6    End of Session Equipment Utilized During Treatment: Oxygen  OT Visit Diagnosis: Muscle weakness (generalized) (M62.81);Other symptoms and signs involving cognitive function;Pain;Other abnormalities of gait and mobility (R26.89);Unsteadiness on feet (R26.81);Other (comment) Hemiplegia - Right/Left: Left Hemiplegia - dominant/non-dominant: Non-Dominant Hemiplegia - caused by: Cerebral infarction   Activity Tolerance Patient limited by fatigue;Patient tolerated treatment well   Patient Left in bed;with call bell/phone within reach;with bed alarm set;with family/visitor present   Nurse Communication Mobility status        Time: 1610-9604 OT Time Calculation (min): 32 min  Charges: OT General Charges $OT Visit: 1 Visit OT  Treatments $Therapeutic Activity: 8-22 mins  Hal Neer OTR/L   Malachi Bonds 12/21/2023, 3:51 PM

## 2023-12-21 NOTE — Progress Notes (Signed)
 Nutrition Follow-up  DOCUMENTATION CODES:   Not applicable  INTERVENTION:  Continue with current TF Nepro @ 43ml/hr to encourage po intake Continue to encourage  DYS 1/ HTL diet Continue Magic cup TID with meals, each supplement provides 290 kcal and 9 grams of protein Juven BID, each packet provides 95 calories, 2.5 grams of protein (collagen), and micronutrients to support wound healing Renal MVI daily     NUTRITION DIAGNOSIS:   Inadequate oral intake related to acute illness as evidenced by NPO status.  Improving with interventions in place.  GOAL:   Patient will meet greater than or equal to 90% of their needs    MONITOR:   TF tolerance, I & O's, Labs, Weight trends  REASON FOR ASSESSMENT:   Ventilator    ASSESSMENT:   Pt  presented 1/23 with confusion and worsening L chest pain secondary to COVID and RSV infection. PMH significant for ESRD on HD, CHF, CVA with residual L sided weakness, T2DM, R BKA, seizure disorder. Patient discussed during rounds this morning, continues with EN support with rate being decreased to help encourage oral intake,  SLP continues to follow with recommendations noted below. Willow continue to monitor for oral intake improvement.   SLP recommendations (2/27) Diet recommendations: Dysphagia 1 (puree);Honey-thick liquid Liquids provided via: Teaspoon (1/2 tsp) Medication Administration: Crushed with puree Supervision: Staff to assist with self feeding;Full supervision/cueing for compensatory strategies Compensations: Slow rate;Small sips/bites;Multiple dry swallows after each bite/sip Postural Changes and/or Swallow Maneuvers: Seated upright 90 degrees;Upright 30-60 min after meal 1/23 admit to [facility] for acute infarct + covid/rsv pneumonia 1/30 admit to ICU after patient had PEA arrest  2/1 Failed SBT due to apnea. ABG with respiratory alkalosis. RR decreased 2/2 Tolerating SBT. Remains encephalopathic 2/5 Continues to tolerate SBT during  day, but remains encephalopathic/unable to protect airway if extubated  2/9 extubated, off precedex 2/12 still lethargic, ABG 2/11 unremarkable, limit centrally acting meds 2/15 transferred to my care, still somnolent on NG tube feeds, aspirated her vomitus, again in respiratory distress, considered back to ICU 12/14/2023 transferred by from ICU, transferred to my service on 12/16/2023 12/19/2023.  Modified barium swallow.  Started on dysphagia 1 diet with honey thick liquids  12/21/2023 EN support rate decreased to 25/ml/hr to help encourage oral intake Hospital weight history: 12/21/23 0453 68.5 kg 151.02 lbs  12/20/23 1425 66.6 kg  146.83 lbs  12/20/23 0935 68.6 kg 151.24 lbs  12/19/23 0500 70.9 kg 156.31 lbs  12/18/23 1340 70 kg 154.32 lbs  12/18/23 1044 70.9 kg 156.31 lbs  12/18/23 0500 70.9 kg 156.31 lbs  12/17/23 0500 70.5 kg 155.42 lbs  12/16/23 0500 72.2 kg 159.17 lbs  12/15/23 2156 72.2 kg 159.17 lbs  12/15/23 0500 69.3 kg 152.78 lbs  12/14/23 1201 71.3 kg 157.19 lbs  12/14/23 0332 72.4 kg 159.61 lbs  12/13/23 0500 71.1 kg 156.75 lbs  12/12/23 0740 74 kg 163.14 lbs  12/12/23 0500 74 kg 163.14 lbs  12/11/23 0500 73.6 kg 162.26 lbs  12/10/23 1500 72.6 kg 160.05 lbs  12/09/23 1231 51.4 kg 113.32 lbs  12/09/23 0437 74 kg 163.14 lbs  12/08/23 1838 70.5 kg 155.42 lbs  12/08/23 1341 72 kg 158.73 lbs  12/08/23 0500 72 kg 158.73 lbs  12/07/23 0500 72 kg 158.73 lbs  12/06/23 0945 72 kg 158.73 lbs  12/05/23 0152 73.1 kg 161.16 lbs  12/04/23 1330 73.8 kg 162.7 lbs  12/04/23 0917 71.8 kg 158.29 lbs  12/03/23 0400 71.8 kg 158.29 lbs  12/02/23  0509 71.8 kg 158.29 lbs  12/01/23 0500 70.6 kg 155.65 lbs  11/30/23 1228 70.6 kg 155.65 lbs  11/30/23 0839 70.6 kg 155.65 lbs  11/30/23 0500 70.6 kg 155.65 lbs  11/29/23 0500 70 kg 154.32 lbs  11/28/23 1409 70 kg 154.32 lbs  11/28/23 1039 71 kg 156.53 lbs  11/28/23 0431 71 kg 156.53 lbs  11/27/23 0500 69.7 kg 153.66 lbs  11/26/23 0533 73  kg 160.94 lbs  11/26/23 0151 73.9 kg 162.92 lbs  11/20/23 1900 76.8 kg 169.31 lbs  11/20/23 1500 79.5 kg 175.27 lbs  11/18/23 0500 79.5 kg 175.27 lbs  11/17/23 1900 79.5 kg 175.27 lbs   NUTRITION - FOCUSED PHYSICAL EXAM:  Flowsheet Row Most Recent Value  Orbital Region No depletion  Upper Arm Region No depletion  Thoracic and Lumbar Region Unable to assess  Buccal Region Unable to assess  Temple Region No depletion  Clavicle Bone Region Mild depletion  Clavicle and Acromion Bone Region No depletion  Scapular Bone Region Mild depletion  Dorsal Hand Unable to assess  Patellar Region Moderate depletion  Anterior Thigh Region Moderate depletion  [R BKA]  Posterior Calf Region Moderate depletion  Edema (RD Assessment) None  Hair Reviewed  Eyes Reviewed  Mouth Unable to assess  Skin Reviewed  Nails Unable to assess       Diet Order:   Diet Order             DIET - DYS 1 Room service appropriate? No; Fluid consistency: Honey Thick  Diet effective now                   EDUCATION NEEDS:   No education needs have been identified at this time  Skin:  Skin Assessment: Reviewed RN Assessment Skin Integrity Issues:: Stage II Stage II: medial sacrum  Last BM:  12/20/23 (type 7)  Height:   Ht Readings from Last 1 Encounters:  11/28/23 5\' 3"  (1.6 m)    Weight:   Wt Readings from Last 1 Encounters:  12/21/23 68.5 kg    Ideal Body Weight:  52.3 kg  BMI:  Body mass index is 26.75 kg/m.  Estimated Nutritional Needs:   Kcal:  1600-1800 kcal/d  Protein:  80-100 g/d  Fluid:  1L + UOP    Jamelle Haring RDN, LDN Clinical Dietitian   If unable to reach, please contact "RD Inpatient" secure chat group between 8 am-4 pm daily"

## 2023-12-22 DIAGNOSIS — G9340 Encephalopathy, unspecified: Secondary | ICD-10-CM | POA: Diagnosis not present

## 2023-12-22 LAB — CBC WITH DIFFERENTIAL/PLATELET
Abs Immature Granulocytes: 0.13 10*3/uL — ABNORMAL HIGH (ref 0.00–0.07)
Basophils Absolute: 0.1 10*3/uL (ref 0.0–0.1)
Basophils Relative: 0 %
Eosinophils Absolute: 0.3 10*3/uL (ref 0.0–0.5)
Eosinophils Relative: 2 %
HCT: 22.2 % — ABNORMAL LOW (ref 36.0–46.0)
Hemoglobin: 7.6 g/dL — ABNORMAL LOW (ref 12.0–15.0)
Immature Granulocytes: 1 %
Lymphocytes Relative: 18 %
Lymphs Abs: 3.4 10*3/uL (ref 0.7–4.0)
MCH: 31.7 pg (ref 26.0–34.0)
MCHC: 34.2 g/dL (ref 30.0–36.0)
MCV: 92.5 fL (ref 80.0–100.0)
Monocytes Absolute: 2.1 10*3/uL — ABNORMAL HIGH (ref 0.1–1.0)
Monocytes Relative: 11 %
Neutro Abs: 13.3 10*3/uL — ABNORMAL HIGH (ref 1.7–7.7)
Neutrophils Relative %: 68 %
Platelets: 491 10*3/uL — ABNORMAL HIGH (ref 150–400)
RBC: 2.4 MIL/uL — ABNORMAL LOW (ref 3.87–5.11)
RDW: 17.1 % — ABNORMAL HIGH (ref 11.5–15.5)
WBC: 19.2 10*3/uL — ABNORMAL HIGH (ref 4.0–10.5)
nRBC: 0 % (ref 0.0–0.2)

## 2023-12-22 LAB — RENAL FUNCTION PANEL
Albumin: 2.3 g/dL — ABNORMAL LOW (ref 3.5–5.0)
Anion gap: 16 — ABNORMAL HIGH (ref 5–15)
BUN: 94 mg/dL — ABNORMAL HIGH (ref 8–23)
CO2: 23 mmol/L (ref 22–32)
Calcium: 9.3 mg/dL (ref 8.9–10.3)
Chloride: 94 mmol/L — ABNORMAL LOW (ref 98–111)
Creatinine, Ser: 5.14 mg/dL — ABNORMAL HIGH (ref 0.44–1.00)
GFR, Estimated: 8 mL/min — ABNORMAL LOW (ref >60)
Glucose, Bld: 103 mg/dL — ABNORMAL HIGH (ref 70–99)
Phosphorus: 3.5 mg/dL (ref 2.5–4.6)
Potassium: 4.5 mmol/L (ref 3.5–5.1)
Sodium: 133 mmol/L — ABNORMAL LOW (ref 135–145)

## 2023-12-22 LAB — GLUCOSE, CAPILLARY
Glucose-Capillary: 120 mg/dL — ABNORMAL HIGH (ref 70–99)
Glucose-Capillary: 102 mg/dL — ABNORMAL HIGH (ref 70–99)
Glucose-Capillary: 148 mg/dL — ABNORMAL HIGH (ref 70–99)

## 2023-12-22 LAB — PROCALCITONIN: Procalcitonin: 2.28 ng/mL

## 2023-12-22 LAB — C-REACTIVE PROTEIN: CRP: 7.5 mg/dL — ABNORMAL HIGH (ref <1.0)

## 2023-12-22 MED ORDER — INSULIN ASPART 100 UNIT/ML IJ SOLN
0.0000 [IU] | Freq: Three times a day (TID) | INTRAMUSCULAR | Status: DC
  Administered 2023-12-22 – 2023-12-23 (×3): 1 [IU] via SUBCUTANEOUS
  Administered 2023-12-24 (×2): 2 [IU] via SUBCUTANEOUS

## 2023-12-22 MED ORDER — BANATROL TF EN LIQD
60.0000 mL | Freq: Two times a day (BID) | ENTERAL | Status: DC
  Administered 2023-12-22 – 2023-12-28 (×12): 60 mL via ORAL
  Filled 2023-12-22 (×12): qty 60

## 2023-12-22 MED ORDER — HEPARIN SODIUM (PORCINE) 1000 UNIT/ML IJ SOLN
INTRAMUSCULAR | Status: AC
  Filled 2023-12-22: qty 4

## 2023-12-22 NOTE — Procedures (Signed)
 HD Note:  Some information was entered later than the data was gathered due to patient care needs. The stated time with the data is accurate.  Received patient in bed to unit.   Patient not focused in her attention with her eyes.  She doesn't respond verbally.  She is alert.   Informed consent signed and in chart.   Access used: Upper right chest HD catheter Access issues: The catheter was sluggish at times and the dialyzer clotted at one point.  See flowsheet  Hand-off given to oncoming dialysis nurse.    [name] L. Dareen Piano, RN [facility].

## 2023-12-22 NOTE — Progress Notes (Signed)
 [facility] Progress Note   Subjective:    Last HD on 2/26 with 2 kg UF. She is nonverbal on exam today.  I called her husband on 2/27.  He and I discussed gentle UF with HD at that time.    Review of systems:    Unable to obtain secondary to AMS, not following commands   Objective Vitals:   12/22/23 0000 12/22/23 0400 12/22/23 0500 12/22/23 0800  BP: (!) 110/44 (!) 121/42  (!) 120/46  Pulse: 81 78  78  Resp: 14 (!) 23  (!) 21  Temp:  98.5 F (36.9 C)  98.5 F (36.9 C)  TempSrc:  Oral  Oral  SpO2: 100% 100%  100%  Weight:   69.5 kg   Height:       Physical Exam  General elderly female chronically ill appearing; in bed in no acute distress HEENT normocephalic atraumatic extraocular movements intact sclera anicteric Neck supple trachea midline Lungs occ rhonchi to auscultation bilaterally normal work of breathing at rest on 2.5 liters oxygen Heart S1S2 no rub Abdomen soft nontender nondistended Extremities no edema appreciated; right BKA  Psych no anxiety or agitation Neuro -nonverbal; opens eyes to exam. Does not answer questions or follow commands Access RIJ tunn catheter; right AVF with bruit and thrill     Dialysis Orders: MWF NW  4h   B350    75.5kg   2/2 bath   R fem TDC  Heparin 2600 + 1400 midrun - last HD 1/22, getting to dry wt - venofer 100 qhd thru 1/31 - mircera 150 q2, last 1/17, due 1/31  Renal-related home meds: - rocaltrol 0.5 mwf - sensipar 30 every day - hydralazine 100 tid - losartan 50 bid - renvela 800mg  ac tid - others: brilinta, crestor, keppra, ritalin, neurontin, asa, PPI, dulera, insulin, albuterol, elavil  CXR 2/18, 2/22 - small R effusion, no edema/ congestion   Assessment/Plan: Acute hypoxic respiratory failure - multifactorial with +COVID/ +RSV pna/ volume^: resolved and S/P course of abx + HD.  CVA: R then L cerebellar infarcts d/t small vessel disease. Per neurology.  On aspirin and plavix for now  AMS:  multifactorial. Slow improvement per charting.  Aspiration pneumonitis - noted, was moved to the ICU again for same and now back on floor. Speech is following.  Optimize volume with HD - continued oxygen requirement ESRD: on HD MWF schedule.  HTN - controlled  Anemia of esrd: Last ESA on 2/17.  Increased aranesp to 200 mcg every Monday.  Tsat 26% and ferritin 1638.  Getting po iron.  Transfuse as needed per primary  Secondary hyperparathyroidism -  phos ok. On nepro.    H/o seizure d/o - on Keppra PEA arrest - noted recent complicated history   Disposition - per primary team   Recent Labs  Lab 12/19/23 0439 12/19/23 0543 12/20/23 1025 12/21/23 0455 12/22/23 0552  HGB  --    < >  --  8.2* 7.6*  ALBUMIN 2.4*  --   --   --  2.3*  CALCIUM 9.2  --  9.0  --  9.3  PHOS 3.6  --   --   --  3.5  CREATININE 3.72*  --  5.35*  --  5.14*  K 4.9  --  4.9  --  4.5   < > = values in this interval not displayed.    Inpatient medications:  arformoterol  15 mcg Nebulization BID   aspirin  81 mg Oral  Daily   budesonide (PULMICORT) nebulizer solution  0.5 mg Nebulization BID   Chlorhexidine Gluconate Cloth  6 each Topical Q0600   Chlorhexidine Gluconate Cloth  6 each Topical Q0600   Chlorhexidine Gluconate Cloth  6 each Topical Q0600   clopidogrel  75 mg Oral Daily   [START ON 12/25/2023] darbepoetin (ARANESP) injection - DIALYSIS  200 mcg Subcutaneous Q Mon-1800   ferrous sulfate  300 mg Oral Daily   fiber supplement (BANATROL TF)  60 mL Oral BID   free water  100 mL Per Tube Q6H   heparin injection (subcutaneous)  5,000 Units Subcutaneous Q8H   hydrALAZINE  50 mg Oral Q8H   insulin aspart  0-15 Units Subcutaneous Q4H   insulin glargine  6 Units Subcutaneous Daily   levETIRAcetam  500 mg Oral BID   melatonin  3 mg Oral QHS   multivitamin  1 tablet Oral QHS   nutrition supplement (JUVEN)  1 packet Per Tube BID BM   mouth rinse  15 mL Mouth Rinse Q4H   rosuvastatin  10 mg Oral Daily   sodium  chloride flush  3 mL Intravenous Q12H    feeding supplement (NEPRO CARB STEADY) Stopped (12/22/23 0700)   acetaminophen **OR** acetaminophen, docusate, ipratropium-albuterol, labetalol, ondansetron (ZOFRAN) IV, mouth rinse, polyethylene glycol, Racepinephrine HCl, white petrolatum, witch hazel-glycerin     Estanislado Emms, MD 8:22 AM 12/22/2023

## 2023-12-22 NOTE — Plan of Care (Signed)
  Problem: Metabolic: Goal: Ability to maintain appropriate glucose levels will improve Outcome: Progressing   Problem: Respiratory: Goal: Will maintain a patent airway Outcome: Progressing   Problem: Clinical Measurements: Goal: Will remain free from infection Outcome: Progressing Goal: Respiratory complications will improve Outcome: Progressing Goal: Cardiovascular complication will be avoided Outcome: Progressing

## 2023-12-22 NOTE — Progress Notes (Addendum)
 PROGRESS NOTE                                                                                                                                                                                                             Patient Demographics:    Carolyn Russo, is a 73 y.o. female, DOB - 1951/10/21, AOZ:[id number]  Outpatient Primary MD for the patient is Annita Brod, MD    LOS - 36  Admit date - 11/16/2023    Chief Complaint  Patient presents with   Chest Pain   Dialysis pt       Brief Narrative (HPI from H&P)   73 yo female with ESRD on HD MWF, CHF, previous CVA with residual left sided weakness, T2DM, right BKA, and seizure disorder. She presented to [facility] on 1/23 with confusion, left sided chest pain, shortness of breath, and coughing. She was found to be hypoxic on room air and placed on 2 L [place]. CT chest was done without evidence of PE.  MRI revealed a 1.3cm acute to early subacute right PICA infarct. Also was admitted with pneumonia due to COVID and RSV and was started on antibiotics for likely superimposed bacterial infection.    On 1/30 early morning the patient was ~2 hrs into dialysis when her blood pressure dropped. UF goal was decreased but since the patient's level of consciousnesses decreased, she received 300 cc NS bolus. EKG changes were noted and rapid response was activated, dialysis stopped, and blood returned to the patient. Around 6:30 AM the patient was noted to be apneic without a pulse and code blue was called. Patient was noted to have PEA arrest. CPR was done for a few minutes before ROSC was achieved. She was transferred to 38M and patient was intubated due to failure to protect airway.  She continued to be tenuous and minimally responsive, finally she was extubated on 12/03/2023 transferred to my care on 12/09/2023, early in the morning she was fairly stable however within a few hours she had an episode of  emesis witnessed by her husband subsequently developed A-fib RVR and respiratory distress, cardiology PCCM again called bedside and she was transferred to ICU, she stayed there for another 5 days stabilized however still dependent on NG tube feeds, was seen by palliative care.  She was transferred to my service on 12/16/2023 again.  Overall condition report is extremely tenuous, still very high risk for aspiration, still overall extremely weak and deconditioned, family for now wants to pursue all modalities of treatment.  She is currently on NG tube, needs 5-day vacation from [facility] to get a peg tube.   Significant Hospital Events & Prcedures:    1/23 admit to [facility] for acute infarct + covid/rsv pneumonia 1/30 admit to ICU after patient had PEA arrest  2/1 Failed SBT due to apnea. ABG with respiratory alkalosis. RR decreased 2/2 Tolerating SBT. Remains encephalopathic 2/5 Continues to tolerate SBT during day, but remains encephalopathic/unable to protect airway if extubated  2/9 extubated, off precedex 2/12 still lethargic, ABG 2/11 unremarkable, limit centrally acting meds 2/15 transferred to my care, still somnolent on NG tube feeds, aspirated her vomitus, again in respiratory distress, considered back to ICU 12/14/2023 transferred by from ICU, transferred to my service on 12/16/2023 12/19/2023.  Modified barium swallow.  Started on dysphagia 1 diet with honey thick liquids   Unfortunately overall patient continues to appear quite weak and frail, still NG tube feeding dependent, very weak cough reflex, very high risk for recurrent aspiration of oral secretions and regurgitated food material, overall still bedbound, overall prognosis appears poor explained to patient's husband clearly bedside on 12/17/2023.  Called and discussed with son on 12/18/2023 as well.    Subjective:   Patient in bed awake, denies any headache or chest pain, no shortness of breath.   Assessment  & Plan :    PEA arrest: due  to hypotension during HD, not primarily cardiac in nature, intubated for airway protection and stabilized in ICU, seen by cardiology initially, now stable from cardiac arrest standpoint.  Acute encephalopathy: secondary to post-arrest, stroke, metabolic derangements, and drug induced. Concern for cefepime toxicity given her hx of ESRD, med was discontinued 2/4. MRI brain did show a new and acute central left cerebellar infarct.  Appreciate neurology recs.  Stroke not felt to be significant enough to explain altered mental status, Seroquel and amitriptyline have been stopped, mentation gradually improving now.  Still unable to swallow safely still NG tube bound on 12/16/2023.  Aspiration on 12/09/2023 followed by acute hypoxic respiratory failure with respiratory distress and A-fib RVR.  Treated in ICU, now off of antibiotics, still on NG tube feeds, still has dysphagia, still high risk for aspiration of oral contents, please see below.  Acute to early subacute right PICA infarct on 1/24, resolved New acute central left cerebellar infarct with dysphagia, previous residual left-sided hemiparesis - Appreciate neuro follow up , on DAPT with aspirin and brilinta along with statin, PEG tube held back per family request, currently on dysphagia 1 diet with honey thick liquids per SLP since 12/20/2023, has NG tube, feeding will be held on 12/22/2023 if she tolerates oral diet well for another 24 hours then will discontinue NG tube on 2/29/2025.  - Needs 30 d heart monitor at discharge   Paroxysmal atrial fibrillation with question of possible underlying sick sinus syndrome.  Seen by cardiology.  Currently on oral amiodarone.  Long-term anticoagulation not recommended by cardiology this admission.  Hypertension  - Currently on hydralazine via tube.   Leukocytosis Sinusitis - MRI brain noted progressed mastoid effusions, she has finished her treatment with Augmentin   Pneumonia due to COVID and RSV - Productive  Cough, subsequently aspiration pneumonia.  Initial treatment for this problem off of antibiotics now.  Will try and keep tube feed rate around 35 cc an hour as she  tends to aspirate on higher rate of tube feed.  Abnormal C6 signal MRI showed a nonspecific bone marrow signal of c6 vertebral body - unclear if related to a malignancy vs discitis/osteomyelitis. Will hold off on C spine imaging, as family does not want to pursue this workup and priority is mental status/neuro recovery.    If no other findings, consider C spine MRI to further evaluate   Seizure disorder  - Continue home keppra 500 mg BID seen by neurology this admission.   ESRD on HD MWF - Dialysis per nephro - Discontinued renvela due to low phos levels   Anemia of chronic disease - 1 unit blood on 2/10, Hgb stable - trend CBC - Aranesp every Friday    Diabetes - Continue long-acting insulin along with ISS, dose adjusted due to change in diet on 12/22/2023  CBG (last 3)  Recent Labs    12/21/23 2355 12/22/23 0422 12/22/23 0823  GLUCAP 83 120* 102*         Condition - Extremely Guarded  Family Communication  :    Updated son [phone]  in detail on 12/18/2023, wants to hold off on PEG tube till 2/29/2025, called 12/20/2023 at 8:20 AM.  No response.  Husband x 2 on 12/09/23, husband bedside 12/17/2023, called [phone] and left message on 12/20/2023 at 8:24 AM, called [phone] - updated   Code Status :  Full  Consults  :  PCCM, Cards, Pall Care, Renal,     PUD Prophylaxis : PPI   Procedures  :            Disposition Plan  :    Status is: Inpatient    DVT Prophylaxis  :    SCDs Start: 11/23/23 0716 heparin injection 5,000 Units Start: 11/17/23 0930     Lab Results  Component Value Date   PLT 491 (H) 12/22/2023    Diet :  Diet Order             DIET - DYS 1 Room service appropriate? No; Fluid consistency: Honey Thick  Diet effective now                    Inpatient  Medications  Scheduled Meds:  arformoterol  15 mcg Nebulization BID   aspirin  81 mg Oral Daily   budesonide (PULMICORT) nebulizer solution  0.5 mg Nebulization BID   Chlorhexidine Gluconate Cloth  6 each Topical Q0600   clopidogrel  75 mg Oral Daily   [START ON 12/25/2023] darbepoetin (ARANESP) injection - DIALYSIS  200 mcg Subcutaneous Q Mon-1800   ferrous sulfate  300 mg Oral Daily   fiber supplement (BANATROL TF)  60 mL Oral BID   free water  100 mL Per Tube Q6H   heparin injection (subcutaneous)  5,000 Units Subcutaneous Q8H   hydrALAZINE  50 mg Oral Q8H   insulin aspart  0-9 Units Subcutaneous TID WC   insulin glargine  6 Units Subcutaneous Daily   levETIRAcetam  500 mg Oral BID   melatonin  3 mg Oral QHS   multivitamin  1 tablet Oral QHS   nutrition supplement (JUVEN)  1 packet Per Tube BID BM   mouth rinse  15 mL Mouth Rinse Q4H   rosuvastatin  10 mg Oral Daily   sodium chloride flush  3 mL Intravenous Q12H   Continuous Infusions:  feeding supplement (NEPRO CARB STEADY) Stopped (12/22/23 0700)   PRN Meds:.acetaminophen **OR** acetaminophen, docusate, ipratropium-albuterol, labetalol, ondansetron (ZOFRAN) IV, mouth  rinse, polyethylene glycol, Racepinephrine HCl, white petrolatum, witch hazel-glycerin    Objective:   Vitals:   12/22/23 0000 12/22/23 0400 12/22/23 0500 12/22/23 0800  BP: (!) 110/44 (!) 121/42  (!) 120/46  Pulse: 81 78  78  Resp: 14 (!) 23  (!) 21  Temp:  98.5 F (36.9 C)  98.5 F (36.9 C)  TempSrc:  Oral  Oral  SpO2: 100% 100%  100%  Weight:   69.5 kg   Height:        Wt Readings from Last 3 Encounters:  12/22/23 69.5 kg  11/01/23 79.5 kg  08/24/23 83 kg    No intake or output data in the 24 hours ending 12/22/23 0919     Physical Exam  Awake and alert this morning, following all commands, NG tube in place, R BKA [place].AT,PERRAL Supple Neck, No JVD,   Symmetrical Chest wall movement, Good air movement bilaterally, bilateral breath  sounds RRR,No Gallops,Rubs or new Murmurs,  +ve B.Sounds, Abd Soft, No tenderness,   No Cyanosis, Clubbing or edema     RN pressure injury documentation: Pressure Injury 12/02/23 Sacrum Medial Stage 2 -  Partial thickness loss of dermis presenting as a shallow open injury with a red, pink wound bed without slough. (Active)  12/02/23 1402  Location: Sacrum  Location Orientation: Medial  Staging: Stage 2 -  Partial thickness loss of dermis presenting as a shallow open injury with a red, pink wound bed without slough.  Wound Description (Comments):   Present on Admission: No  Dressing Type Foam - Lift dressing to assess site every shift 12/21/23 1947      Data Review:    Recent Labs  Lab 12/18/23 0539 12/19/23 0543 12/20/23 0459 12/21/23 0455 12/22/23 0552  WBC 19.7* 20.5* 19.9* 21.0* 19.2*  HGB 8.1* 7.5* 7.6* 8.2* 7.6*  HCT 23.8* 22.7* 22.5* 24.1* 22.2*  PLT 545* 533* 509* 479* 491*  MCV 93.3 93.8 93.0 93.4 92.5  MCH 31.8 31.0 31.4 31.8 31.7  MCHC 34.0 33.0 33.8 34.0 34.2  RDW 18.1* 18.2* 17.8* 17.8* 17.1*  LYMPHSABS 3.0 3.0 2.8 3.8 3.4  MONOABS 2.3* 2.3* 2.3* 2.9* 2.1*  EOSABS 0.4 0.4 0.3 0.2 0.3  BASOSABS 0.1 0.1 0.1 0.1 0.1    Recent Labs  Lab 12/17/23 0535 12/18/23 0538 12/18/23 0539 12/18/23 1114 12/19/23 0439 12/20/23 1025 12/21/23 0925 12/22/23 0552  NA 136  --  138  --  133* 132*  --  133*  K 5.2*  --  5.5*  --  4.9 4.9  --  4.5  CL 92*  --  98  --  97* 95*  --  94*  CO2 26  --  28  --  27 26  --  23  ANIONGAP 18*  --  12  --  9 11  --  16*  GLUCOSE 231*  --  173*  --  158* 137*  --  103*  BUN 71*  --  94*  --  61* 98*  --  94*  CREATININE 4.46*  --  5.67*  --  3.72* 5.35*  --  5.14*  ALBUMIN  --   --   --   --  2.4*  --   --  2.3*  CRP  --   --   --  1.7*  --  4.2* 7.8* 7.5*  PROCALCITON  --  1.47  --   --   --  2.04 2.26 2.28  BNP  --  138.7*  --   --   --   --   --   --  PHOS  --   --   --   --  3.6  --   --  3.5  CALCIUM 10.1  --  9.8  --   9.2 9.0  --  9.3      Recent Labs  Lab 12/17/23 0535 12/18/23 0538 12/18/23 0539 12/18/23 1114 12/19/23 0439 12/20/23 1025 12/21/23 0925 12/22/23 0552  CRP  --   --   --  1.7*  --  4.2* 7.8* 7.5*  PROCALCITON  --  1.47  --   --   --  2.04 2.26 2.28  BNP  --  138.7*  --   --   --   --   --   --   CALCIUM 10.1  --  9.8  --  9.2 9.0  --  9.3    --------------------------------------------------------------------------------------------------------------- Lab Results  Component Value Date   CHOL 90 11/17/2023   HDL 38 (L) 11/17/2023   LDLCALC 37 11/17/2023   TRIG 86 12/02/2023   CHOLHDL 2.4 11/17/2023    Lab Results  Component Value Date   HGBA1C 5.8 (H) 10/26/2023    Micro Results No results found for this or any previous visit (from the past 240 hours).  Radiology Report DG Chest Port 1 View Result Date: 12/21/2023 CLINICAL DATA:  Shortness of breath.  End-stage renal disease. EXAM: PORTABLE CHEST 1 VIEW COMPARISON:  12/20/2023 FINDINGS: Right chest wall dialysis catheter noted with tips in the projection of the right atrium. Feeding tube tip courses below the field of view. Stable cardiomediastinal contours. Persistent opacity within the left lower lung. Right lung is clear. No new changes. IMPRESSION: No change in aeration to the left lower lung. Electronically Signed   By: Signa Kell M.D.   On: 12/21/2023 06:48     Signature  -   Susa Raring M.D on 12/22/2023 at 9:19 AM   -  To page go to [email or web address]

## 2023-12-22 NOTE — TOC Progression Note (Addendum)
 Transition of Care [facility]) - Progression Note    Patient Details  Name: Carolyn Russo MRN: [id number] Date of Birth: [birth year]  Transition of Care [facility]) CM/SW Contact  Mearl Latin, LCSW Phone Number: 12/22/2023, 9:52 AM  Clinical Narrative:    CSW provided update to [facility]. Will begin insurance process once cortrak is removed and once it is confirmed that patient can sit for dialysis.    Expected Discharge Plan: Skilled Nursing Facility Barriers to Discharge: Continued Medical Work up, English as a second language teacher  Expected Discharge Plan and Services In-house Referral: Clinical Social Work   Post Acute Care Choice: Skilled Nursing Facility Living arrangements for the past 2 months: Single Family Home, Skilled Nursing Facility                                       Social Determinants of Health (SDOH) Interventions SDOH Screenings   Food Insecurity: No Food Insecurity (11/17/2023)  Housing: Low Risk  (11/17/2023)  Transportation Needs: No Transportation Needs (11/17/2023)  Utilities: Not At Risk (11/17/2023)  Alcohol Screen: Low Risk  (01/09/2019)  Depression (PHQ2-9): Low Risk  (09/11/2023)  Financial Resource Strain: Low Risk  (09/02/2021)  Physical Activity: Inactive (09/02/2021)  Social Connections: Socially Integrated (11/17/2023)  Stress: Stress Concern Present (09/02/2021)  Tobacco Use: Low Risk  (11/16/2023)    Readmission Risk Interventions    12/15/2023    2:57 PM 02/14/2023    4:54 PM  Readmission Risk Prevention Plan  Transportation Screening Complete Complete  PCP or Specialist Appt within 3-5 Days  Complete  HRI or Home Care Consult  Complete  Social Work Consult for Recovery Care Planning/Counseling  Complete  Palliative Care Screening  Complete  Medication Review Ocean[employer]) Complete Complete  PCP or Specialist appointment within 3-5 days of discharge Complete   HRI or Home Care Consult Complete   SW Recovery Care/Counseling Consult  Complete   Palliative Care Screening Complete   Skilled Nursing Facility Complete

## 2023-12-23 DIAGNOSIS — G9340 Encephalopathy, unspecified: Secondary | ICD-10-CM | POA: Diagnosis not present

## 2023-12-23 LAB — CBC WITH DIFFERENTIAL/PLATELET
Abs Immature Granulocytes: 0.13 10*3/uL — ABNORMAL HIGH (ref 0.00–0.07)
Basophils Absolute: 0.1 10*3/uL (ref 0.0–0.1)
Basophils Relative: 0 %
Eosinophils Absolute: 0.2 10*3/uL (ref 0.0–0.5)
Eosinophils Relative: 1 %
HCT: 23.5 % — ABNORMAL LOW (ref 36.0–46.0)
Hemoglobin: 7.9 g/dL — ABNORMAL LOW (ref 12.0–15.0)
Immature Granulocytes: 1 %
Lymphocytes Relative: 17 %
Lymphs Abs: 2.9 10*3/uL (ref 0.7–4.0)
MCH: 31.2 pg (ref 26.0–34.0)
MCHC: 33.6 g/dL (ref 30.0–36.0)
MCV: 92.9 fL (ref 80.0–100.0)
Monocytes Absolute: 2.1 10*3/uL — ABNORMAL HIGH (ref 0.1–1.0)
Monocytes Relative: 12 %
Neutro Abs: 11.5 10*3/uL — ABNORMAL HIGH (ref 1.7–7.7)
Neutrophils Relative %: 69 %
Platelets: 518 10*3/uL — ABNORMAL HIGH (ref 150–400)
RBC: 2.53 MIL/uL — ABNORMAL LOW (ref 3.87–5.11)
RDW: 17.2 % — ABNORMAL HIGH (ref 11.5–15.5)
WBC: 17 10*3/uL — ABNORMAL HIGH (ref 4.0–10.5)
nRBC: 0 % (ref 0.0–0.2)

## 2023-12-23 LAB — GLUCOSE, CAPILLARY
Glucose-Capillary: 120 mg/dL — ABNORMAL HIGH (ref 70–99)
Glucose-Capillary: 134 mg/dL — ABNORMAL HIGH (ref 70–99)
Glucose-Capillary: 164 mg/dL — ABNORMAL HIGH (ref 70–99)
Glucose-Capillary: 238 mg/dL — ABNORMAL HIGH (ref 70–99)

## 2023-12-23 LAB — C-REACTIVE PROTEIN: CRP: 8.8 mg/dL — ABNORMAL HIGH (ref <1.0)

## 2023-12-23 LAB — PROCALCITONIN: Procalcitonin: 1.65 ng/mL

## 2023-12-23 NOTE — Progress Notes (Signed)
 PROGRESS NOTE                                                                                                                                                                                                             Patient Demographics:    Carolyn Russo, is a 73 y.o. female, DOB - 1951-02-17, NWG:[id number]  Outpatient Primary MD for the patient is Annita Brod, MD    LOS - 37  Admit date - 11/16/2023    Chief Complaint  Patient presents with   Chest Pain   Dialysis pt       Brief Narrative (HPI from H&P)   73 yo female with ESRD on HD MWF, CHF, previous CVA with residual left sided weakness, T2DM, right BKA, and seizure disorder. She presented to [facility] on 1/23 with confusion, left sided chest pain, shortness of breath, and coughing. She was found to be hypoxic on room air and placed on 2 L [place]. CT chest was done without evidence of PE.  MRI revealed a 1.3cm acute to early subacute right PICA infarct. Also was admitted with pneumonia due to COVID and RSV and was started on antibiotics for likely superimposed bacterial infection.    On 1/30 early morning the patient was ~2 hrs into dialysis when her blood pressure dropped. UF goal was decreased but since the patient's level of consciousnesses decreased, she received 300 cc NS bolus. EKG changes were noted and rapid response was activated, dialysis stopped, and blood returned to the patient. Around 6:30 AM the patient was noted to be apneic without a pulse and code blue was called. Patient was noted to have PEA arrest. CPR was done for a few minutes before ROSC was achieved. She was transferred to 59M and patient was intubated due to failure to protect airway.  She continued to be tenuous and minimally responsive, finally she was extubated on 12/03/2023 transferred to my care on 12/09/2023, early in the morning she was fairly stable however within a few hours she had an episode of  emesis witnessed by her husband subsequently developed A-fib RVR and respiratory distress, cardiology PCCM again called bedside and she was transferred to ICU, she stayed there for another 5 days stabilized however still dependent on NG tube feeds, was seen by palliative care.  She was transferred to my service on 12/16/2023 again.  Overall condition report is extremely tenuous, still very high risk for aspiration, still overall extremely weak and deconditioned, family for now wants to pursue all modalities of treatment.  She is currently on NG tube, needs 5-day vacation from DAPT to get a peg tube.   Significant Hospital Events & Prcedures:    1/23 admit to [facility] for acute infarct + covid/rsv pneumonia 1/30 admit to ICU after patient had PEA arrest  2/1 Failed SBT due to apnea. ABG with respiratory alkalosis. RR decreased 2/2 Tolerating SBT. Remains encephalopathic 2/5 Continues to tolerate SBT during day, but remains encephalopathic/unable to protect airway if extubated  2/9 extubated, off precedex 2/12 still lethargic, ABG 2/11 unremarkable, limit centrally acting meds 2/15 transferred to my care, still somnolent on NG tube feeds, aspirated her vomitus, again in respiratory distress, considered back to ICU 12/14/2023 transferred by from ICU, transferred to my service on 12/16/2023 12/19/2023.  Modified barium swallow.  Started on dysphagia 1 diet with honey thick liquids 12/23/2023.  NG tube discontinued   Unfortunately overall patient continues to appear quite weak and frail, still NG tube feeding dependent, very weak cough reflex, very high risk for recurrent aspiration of oral secretions and regurgitated food material, overall still bedbound, overall prognosis appears poor explained to patient's husband clearly bedside on 12/17/2023.  Called and discussed with son on 12/18/2023 as well.    Subjective:   Patient in bed, appears comfortable, denies any headache, no fever, no chest pain or pressure,  no shortness of breath , no abdominal pain. No focal weakness.   Assessment  & Plan :    PEA arrest: due to hypotension during HD, not primarily cardiac in nature, intubated for airway protection and stabilized in ICU, seen by cardiology initially, now stable from cardiac arrest standpoint.  Acute encephalopathy: secondary to post-arrest, stroke, metabolic derangements, and drug induced. Concern for cefepime toxicity given her hx of ESRD, med was discontinued 2/4. MRI brain did show a new and acute central left cerebellar infarct.  Appreciate neurology recs.  Stroke not felt to be significant enough to explain altered mental status, Seroquel and amitriptyline have been stopped, mentation gradually improving now.  Still unable to swallow safely still NG tube bound on 12/16/2023.  Aspiration on 12/09/2023 followed by acute hypoxic respiratory failure with respiratory distress and A-fib RVR.  Treated in ICU, now off of antibiotics, still on NG tube feeds, still has dysphagia, still high risk for aspiration of oral contents, please see below.  Acute to early subacute right PICA infarct on 1/24, resolved New acute central left cerebellar infarct with dysphagia, previous residual left-sided hemiparesis - Appreciate neuro follow up , on DAPT with aspirin and brilinta along with statin, PEG tube held back per family request, currently on dysphagia 1 diet with honey thick liquids per SLP since 12/20/2023, tolerated oral diet well for the last 3 days no clinical signs of aspiration discontinue NG tube after discussions with husband bedside on 12/23/2023.  - Needs 30 d heart monitor at discharge   Paroxysmal atrial fibrillation with question of possible underlying sick sinus syndrome.  Seen by cardiology.  Currently on oral amiodarone.  Long-term anticoagulation not recommended by cardiology this admission.  Hypertension  - Currently on hydralazine via tube.   Leukocytosis Sinusitis - MRI brain noted progressed  mastoid effusions, she has finished her treatment with Augmentin   Pneumonia due to COVID and RSV - Productive Cough, subsequently aspiration pneumonia.  Initial treatment for this problem off of antibiotics now.  Will try and keep tube feed rate around 35 cc an hour as she tends to aspirate on higher rate of tube feed.  Abnormal C6 signal MRI showed a nonspecific bone marrow signal of c6 vertebral body - unclear if related to a malignancy vs discitis/osteomyelitis. Will hold off on C spine imaging, as family does not want to pursue this workup and priority is mental status/neuro recovery.    If no other findings, consider C spine MRI to further evaluate   Seizure disorder  - Continue home keppra 500 mg BID seen by neurology this admission.   ESRD on HD MWF - Dialysis per nephro - Discontinued renvela due to low phos levels   Anemia of chronic disease - 1 unit blood on 2/10, Hgb stable - trend CBC - Aranesp every Friday    Diabetes - Continue long-acting insulin along with ISS, dose adjusted due to change in diet on 12/22/2023  CBG (last 3)  Recent Labs    12/22/23 1124 12/23/23 0536 12/23/23 0808  GLUCAP 148* 120* 134*         Condition - Extremely Guarded  Family Communication  :    Updated son [phone]  in detail on 12/18/2023, wants to hold off on PEG tube till 2/29/2025, called 12/20/2023 at 8:20 AM.  No response.  Husband x 2 on 12/09/23, husband bedside 12/17/2023, called [phone] and left message on 12/20/2023 at 8:24 AM, called [phone] - updated   Code Status :  Full  Consults  :  PCCM, Cards, Pall Care, Renal,     PUD Prophylaxis : PPI   Procedures  :            Disposition Plan  :    Status is: Inpatient    DVT Prophylaxis  :    SCDs Start: 11/23/23 0716 heparin injection 5,000 Units Start: 11/17/23 0930     Lab Results  Component Value Date   PLT 518 (H) 12/23/2023    Diet :  Diet Order             DIET - DYS 1 Room  service appropriate? No; Fluid consistency: Honey Thick  Diet effective now                    Inpatient Medications  Scheduled Meds:  arformoterol  15 mcg Nebulization BID   aspirin  81 mg Oral Daily   budesonide (PULMICORT) nebulizer solution  0.5 mg Nebulization BID   Chlorhexidine Gluconate Cloth  6 each Topical Q0600   clopidogrel  75 mg Oral Daily   [START ON 12/25/2023] darbepoetin (ARANESP) injection - DIALYSIS  200 mcg Subcutaneous Q Mon-1800   ferrous sulfate  300 mg Oral Daily   fiber supplement (BANATROL TF)  60 mL Oral BID   free water  100 mL Per Tube Q6H   heparin injection (subcutaneous)  5,000 Units Subcutaneous Q8H   hydrALAZINE  50 mg Oral Q8H   insulin aspart  0-9 Units Subcutaneous TID WC   insulin glargine  6 Units Subcutaneous Daily   levETIRAcetam  500 mg Oral BID   melatonin  3 mg Oral QHS   multivitamin  1 tablet Oral QHS   nutrition supplement (JUVEN)  1 packet Per Tube BID BM   mouth rinse  15 mL Mouth Rinse Q4H   rosuvastatin  10 mg Oral Daily   sodium chloride flush  3 mL Intravenous Q12H   Continuous Infusions:  feeding supplement (NEPRO CARB STEADY) Stopped (12/22/23  0700)   PRN Meds:.acetaminophen **OR** acetaminophen, docusate, ipratropium-albuterol, labetalol, ondansetron (ZOFRAN) IV, mouth rinse, polyethylene glycol, Racepinephrine HCl, white petrolatum, witch hazel-glycerin    Objective:   Vitals:   12/23/23 0102 12/23/23 0445 12/23/23 0500 12/23/23 0800  BP: (!) 101/43 (!) 112/53  (!) 121/51  Pulse: 75 76  82  Resp: 20 20  20   Temp: 98.5 F (36.9 C) 97.9 F (36.6 C)  98 F (36.7 C)  TempSrc: Oral Oral  Oral  SpO2: 100% 100%  100%  Weight:   65.3 kg   Height:        Wt Readings from Last 3 Encounters:  12/23/23 65.3 kg  11/01/23 79.5 kg  08/24/23 83 kg     Intake/Output Summary (Last 24 hours) at 12/23/2023 1011 Last data filed at 12/22/2023 2256 Gross per 24 hour  Intake 4604.17 ml  Output 2100 ml  Net 2504.17 ml        Physical Exam  Awake and alert this morning, following all commands,  R BKA [place].AT,PERRAL Supple Neck, No JVD,   Symmetrical Chest wall movement, Good air movement bilaterally, bilateral breath sounds RRR,No Gallops,Rubs or new Murmurs,  +ve B.Sounds, Abd Soft, No tenderness,   No Cyanosis, Clubbing or edema     RN pressure injury documentation: Pressure Injury 12/02/23 Sacrum Medial Stage 2 -  Partial thickness loss of dermis presenting as a shallow open injury with a red, pink wound bed without slough. (Active)  12/02/23 1402  Location: Sacrum  Location Orientation: Medial  Staging: Stage 2 -  Partial thickness loss of dermis presenting as a shallow open injury with a red, pink wound bed without slough.  Wound Description (Comments):   Present on Admission: No  Dressing Type Foam - Lift dressing to assess site every shift 12/23/23 0730      Data Review:    Recent Labs  Lab 12/19/23 0543 12/20/23 0459 12/21/23 0455 12/22/23 0552 12/23/23 0556  WBC 20.5* 19.9* 21.0* 19.2* 17.0*  HGB 7.5* 7.6* 8.2* 7.6* 7.9*  HCT 22.7* 22.5* 24.1* 22.2* 23.5*  PLT 533* 509* 479* 491* 518*  MCV 93.8 93.0 93.4 92.5 92.9  MCH 31.0 31.4 31.8 31.7 31.2  MCHC 33.0 33.8 34.0 34.2 33.6  RDW 18.2* 17.8* 17.8* 17.1* 17.2*  LYMPHSABS 3.0 2.8 3.8 3.4 2.9  MONOABS 2.3* 2.3* 2.9* 2.1* 2.1*  EOSABS 0.4 0.3 0.2 0.3 0.2  BASOSABS 0.1 0.1 0.1 0.1 0.1    Recent Labs  Lab 12/17/23 0535 12/18/23 0538 12/18/23 0539 12/18/23 1114 12/19/23 0439 12/20/23 1025 12/21/23 0925 12/22/23 0552 12/23/23 0556  NA 136  --  138  --  133* 132*  --  133*  --   K 5.2*  --  5.5*  --  4.9 4.9  --  4.5  --   CL 92*  --  98  --  97* 95*  --  94*  --   CO2 26  --  28  --  27 26  --  23  --   ANIONGAP 18*  --  12  --  9 11  --  16*  --   GLUCOSE 231*  --  173*  --  158* 137*  --  103*  --   BUN 71*  --  94*  --  61* 98*  --  94*  --   CREATININE 4.46*  --  5.67*  --  3.72* 5.35*  --  5.14*  --   ALBUMIN   --   --   --   --  2.4*  --   --  2.3*  --   CRP  --   --   --  1.7*  --  4.2* 7.8* 7.5* 8.8*  PROCALCITON  --  1.47  --   --   --  2.04 2.26 2.28 1.65  BNP  --  138.7*  --   --   --   --   --   --   --   PHOS  --   --   --   --  3.6  --   --  3.5  --   CALCIUM 10.1  --  9.8  --  9.2 9.0  --  9.3  --       Recent Labs  Lab 12/17/23 0535 12/18/23 0538 12/18/23 0539 12/18/23 1114 12/19/23 0439 12/20/23 1025 12/21/23 0925 12/22/23 0552 12/23/23 0556  CRP  --   --   --  1.7*  --  4.2* 7.8* 7.5* 8.8*  PROCALCITON  --  1.47  --   --   --  2.04 2.26 2.28 1.65  BNP  --  138.7*  --   --   --   --   --   --   --   CALCIUM 10.1  --  9.8  --  9.2 9.0  --  9.3  --     --------------------------------------------------------------------------------------------------------------- Lab Results  Component Value Date   CHOL 90 11/17/2023   HDL 38 (L) 11/17/2023   LDLCALC 37 11/17/2023   TRIG 86 12/02/2023   CHOLHDL 2.4 11/17/2023    Lab Results  Component Value Date   HGBA1C 5.8 (H) 10/26/2023    Micro Results No results found for this or any previous visit (from the past 240 hours).  Radiology Report No results found.    Signature  -   Susa Raring M.D on 12/23/2023 at 10:11 AM   -  To page go to [email or web address]

## 2023-12-23 NOTE — Progress Notes (Signed)
 [facility] Progress Note   Subjective:    Last HD on 2/28 with 1.9 kg UF.  Spoke with her husband who is at bedside.  She was leaning to one side and I helped him move her in bed.  She took a few minutes to get her breath after changing positions and I have asked for nursing to come and help get her better positioned as well.  He thinks her voice is stronger today.    Review of systems:     Unable to obtain secondary to patient factors.  Limited speech and altered mental status   Objective Vitals:   12/23/23 0102 12/23/23 0445 12/23/23 0500 12/23/23 0800  BP: (!) 101/43 (!) 112/53  (!) 121/51  Pulse: 75 76  82  Resp: 20 20  20   Temp: 98.5 F (36.9 C) 97.9 F (36.6 C)  98 F (36.7 C)  TempSrc: Oral Oral  Oral  SpO2: 100% 100%  100%  Weight:   65.3 kg   Height:       Physical Exam  General elderly female chronically ill appearing; in bed in no acute distress HEENT normocephalic atraumatic extraocular movements intact sclera anicteric Neck supple trachea midline Lungs clear but reduced on auscultation bilaterally normal work of breathing at rest on 2 liters supplemental oxygen Heart S1S2 no rub Abdomen soft nontender nondistended Extremities no edema appreciated; right BKA  Psych no anxiety or agitation Neuro -nonverbal; opens eyes to exam. Does not answer questions or follow commands Access RIJ tunn catheter; right AVF with bruit and thrill     Dialysis Orders: MWF NW  4h   B350    75.5kg   2/2 bath   R fem TDC  Heparin 2600 + 1400 midrun - last HD 1/22, getting to dry wt - venofer 100 qhd thru 1/31 - mircera 150 q2, last 1/17, due 1/31  Renal-related home meds: - rocaltrol 0.5 mwf - sensipar 30 every day - hydralazine 100 tid - losartan 50 bid - renvela 800mg  ac tid - others: brilinta, crestor, keppra, ritalin, neurontin, asa, PPI, dulera, insulin, albuterol, elavil  CXR 2/18, 2/22 - small R effusion, no edema/  congestion   Assessment/Plan: Acute hypoxic respiratory failure - multifactorial with +COVID/ +RSV pna/ volume^: resolved and S/P course of abx + HD.  CVA: R then L cerebellar infarcts d/t small vessel disease. Per neurology.  On aspirin and plavix for now  AMS: multifactorial. Slow improvement per charting.  Aspiration pneumonitis - noted, was moved to the ICU again for same and now back on floor. Speech is following.  Optimize volume with HD - continued oxygen requirement ESRD: on HD MWF schedule.  HTN - controlled.  Follow for need to further reduce hydralazine Anemia of esrd: Last ESA on 2/17.  Increased aranesp to 200 mcg every Monday.  Tsat 26% and ferritin 1638.  Getting po iron.  Transfuse as needed per primary  Secondary hyperparathyroidism -  phos ok. On nepro.    H/o seizure d/o - on Keppra PEA arrest - noted recent complicated history   Disposition - per primary team   Recent Labs  Lab 12/19/23 0439 12/19/23 0543 12/20/23 1025 12/21/23 0455 12/22/23 0552 12/23/23 0556  HGB  --    < >  --    < > 7.6* 7.9*  ALBUMIN 2.4*  --   --   --  2.3*  --   CALCIUM 9.2  --  9.0  --  9.3  --  PHOS 3.6  --   --   --  3.5  --   CREATININE 3.72*  --  5.35*  --  5.14*  --   K 4.9  --  4.9  --  4.5  --    < > = values in this interval not displayed.    Inpatient medications:  arformoterol  15 mcg Nebulization BID   aspirin  81 mg Oral Daily   budesonide (PULMICORT) nebulizer solution  0.5 mg Nebulization BID   Chlorhexidine Gluconate Cloth  6 each Topical Q0600   clopidogrel  75 mg Oral Daily   [START ON 12/25/2023] darbepoetin (ARANESP) injection - DIALYSIS  200 mcg Subcutaneous Q Mon-1800   ferrous sulfate  300 mg Oral Daily   fiber supplement (BANATROL TF)  60 mL Oral BID   free water  100 mL Per Tube Q6H   heparin injection (subcutaneous)  5,000 Units Subcutaneous Q8H   hydrALAZINE  50 mg Oral Q8H   insulin aspart  0-9 Units Subcutaneous TID WC   insulin glargine  6 Units  Subcutaneous Daily   levETIRAcetam  500 mg Oral BID   melatonin  3 mg Oral QHS   multivitamin  1 tablet Oral QHS   nutrition supplement (JUVEN)  1 packet Per Tube BID BM   mouth rinse  15 mL Mouth Rinse Q4H   rosuvastatin  10 mg Oral Daily   sodium chloride flush  3 mL Intravenous Q12H    feeding supplement (NEPRO CARB STEADY) Stopped (12/22/23 0700)   acetaminophen **OR** acetaminophen, docusate, ipratropium-albuterol, labetalol, ondansetron (ZOFRAN) IV, mouth rinse, polyethylene glycol, Racepinephrine HCl, white petrolatum, witch hazel-glycerin     Estanislado Emms, MD 2:51 PM 12/23/2023

## 2023-12-24 ENCOUNTER — Inpatient Hospital Stay (HOSPITAL_COMMUNITY)

## 2023-12-24 DIAGNOSIS — G9340 Encephalopathy, unspecified: Secondary | ICD-10-CM | POA: Diagnosis not present

## 2023-12-24 LAB — CBC WITH DIFFERENTIAL/PLATELET
Abs Immature Granulocytes: 0.15 10*3/uL — ABNORMAL HIGH (ref 0.00–0.07)
Basophils Absolute: 0.1 10*3/uL (ref 0.0–0.1)
Basophils Relative: 0 %
Eosinophils Absolute: 0.1 10*3/uL (ref 0.0–0.5)
Eosinophils Relative: 1 %
HCT: 22.1 % — ABNORMAL LOW (ref 36.0–46.0)
Hemoglobin: 7.4 g/dL — ABNORMAL LOW (ref 12.0–15.0)
Immature Granulocytes: 1 %
Lymphocytes Relative: 15 %
Lymphs Abs: 3.7 10*3/uL (ref 0.7–4.0)
MCH: 31.1 pg (ref 26.0–34.0)
MCHC: 33.5 g/dL (ref 30.0–36.0)
MCV: 92.9 fL (ref 80.0–100.0)
Monocytes Absolute: 2.6 10*3/uL — ABNORMAL HIGH (ref 0.1–1.0)
Monocytes Relative: 11 %
Neutro Abs: 17.2 10*3/uL — ABNORMAL HIGH (ref 1.7–7.7)
Neutrophils Relative %: 72 %
Platelets: 552 10*3/uL — ABNORMAL HIGH (ref 150–400)
RBC: 2.38 MIL/uL — ABNORMAL LOW (ref 3.87–5.11)
RDW: 16.9 % — ABNORMAL HIGH (ref 11.5–15.5)
WBC: 23.8 10*3/uL — ABNORMAL HIGH (ref 4.0–10.5)
nRBC: 0 % (ref 0.0–0.2)

## 2023-12-24 LAB — RENAL FUNCTION PANEL
Albumin: 2.3 g/dL — ABNORMAL LOW (ref 3.5–5.0)
Anion gap: 19 — ABNORMAL HIGH (ref 5–15)
BUN: 81 mg/dL — ABNORMAL HIGH (ref 8–23)
CO2: 25 mmol/L (ref 22–32)
Calcium: 10.1 mg/dL (ref 8.9–10.3)
Chloride: 92 mmol/L — ABNORMAL LOW (ref 98–111)
Creatinine, Ser: 4.59 mg/dL — ABNORMAL HIGH (ref 0.44–1.00)
GFR, Estimated: 9 mL/min — ABNORMAL LOW (ref >60)
Glucose, Bld: 202 mg/dL — ABNORMAL HIGH (ref 70–99)
Phosphorus: 4 mg/dL (ref 2.5–4.6)
Potassium: 4.4 mmol/L (ref 3.5–5.1)
Sodium: 136 mmol/L (ref 135–145)

## 2023-12-24 LAB — GLUCOSE, CAPILLARY
Glucose-Capillary: 161 mg/dL — ABNORMAL HIGH (ref 70–99)
Glucose-Capillary: 166 mg/dL — ABNORMAL HIGH (ref 70–99)
Glucose-Capillary: 178 mg/dL — ABNORMAL HIGH (ref 70–99)
Glucose-Capillary: 179 mg/dL — ABNORMAL HIGH (ref 70–99)
Glucose-Capillary: 207 mg/dL — ABNORMAL HIGH (ref 70–99)
Glucose-Capillary: 223 mg/dL — ABNORMAL HIGH (ref 70–99)

## 2023-12-24 LAB — PROCALCITONIN: Procalcitonin: 1.82 ng/mL

## 2023-12-24 LAB — C-REACTIVE PROTEIN: CRP: 12.8 mg/dL — ABNORMAL HIGH (ref <1.0)

## 2023-12-24 MED ORDER — DEXTROSE 5 % IV SOLN: INTRAVENOUS | Status: AC

## 2023-12-24 MED ORDER — INSULIN ASPART 100 UNIT/ML IJ SOLN
0.0000 [IU] | Freq: Four times a day (QID) | INTRAMUSCULAR | Status: DC
  Administered 2023-12-24: 3 [IU] via SUBCUTANEOUS
  Administered 2023-12-25: 2 [IU] via SUBCUTANEOUS
  Administered 2023-12-25: 1 [IU] via SUBCUTANEOUS
  Administered 2023-12-25: 2 [IU] via SUBCUTANEOUS
  Administered 2023-12-25: 3 [IU] via SUBCUTANEOUS
  Administered 2023-12-25 – 2023-12-26 (×4): 2 [IU] via SUBCUTANEOUS
  Administered 2023-12-27 (×2): 3 [IU] via SUBCUTANEOUS
  Administered 2023-12-27: 2 [IU] via SUBCUTANEOUS
  Administered 2023-12-27 – 2023-12-28 (×2): 3 [IU] via SUBCUTANEOUS

## 2023-12-24 MED ORDER — CHLORHEXIDINE GLUCONATE CLOTH 2 % EX PADS
6.0000 | MEDICATED_PAD | Freq: Every day | CUTANEOUS | Status: DC
  Administered 2023-12-25 – 2023-12-28 (×4): 6 via TOPICAL

## 2023-12-24 MED ORDER — SODIUM CHLORIDE 0.9 % IV SOLN
3.0000 g | INTRAVENOUS | Status: DC
  Administered 2023-12-24 – 2023-12-26 (×2): 3 g via INTRAVENOUS
  Filled 2023-12-24: qty 8

## 2023-12-24 NOTE — Consult Note (Signed)
 WOC is following this patient weekly for sacral wound, due to be seen again the week of 3/3, see our consults notes.  Cierria Height [facility], CNS, [employer] [phone]

## 2023-12-24 NOTE — Progress Notes (Signed)
 Pharmacy Antibiotic Note  Carolyn Russo is a 73 y.o. female admitted on 11/16/2023 with new concern for aspiration pneumonia.  Pharmacy has been consulted for Unasyn dosing.  Patient is ESRD on dialysis with MWF schedule. Patient remains high risk for aspiration per MD given difficulty swallowing and residual left sided deficits from a stroke prior to this admission. White blood cell count elevated at 23.8k, elevated temperature at 100.1 today.    Plan: Start Unasyn 3g q24 hours - dialysis dosing - give AFTER dialysis on dialysis days  Monitor for signs and symptoms of infection Follow up duration of therapy     Height: 5\' 3"  (160 cm) Weight: 68 kg (149 lb 14.6 oz) IBW/kg (Calculated) : 52.4  Temp (24hrs), Avg:99.2 F (37.3 C), Min:98.6 F (37 C), Max:100.1 F (37.8 C)  Recent Labs  Lab 12/18/23 0539 12/19/23 0439 12/19/23 0543 12/20/23 0459 12/20/23 1025 12/21/23 0455 12/22/23 0552 12/23/23 0556 12/24/23 0550  WBC 19.7*  --    < > 19.9*  --  21.0* 19.2* 17.0* 23.8*  CREATININE 5.67* 3.72*  --   --  5.35*  --  5.14*  --  4.59*   < > = values in this interval not displayed.    Estimated Creatinine Clearance: 9.8 mL/min (A) (by C-G formula based on SCr of 4.59 mg/dL (H)).    Allergies  Allergen Reactions   Almond Oil Anaphylaxis   Food Anaphylaxis    Peanuts - anaphylaxis     Gadolinium Derivatives Other (See Comments)    Gadolinium-Containing Contrast Media    Januvia [Sitagliptin] Other (See Comments)   Pork-Derived Products Other (See Comments)    Does not eat pork  OKAY FOR HEPARIN     Microbiology results: No related repeat cultures   Thank you for allowing pharmacy to be a part of this patient's care.  Blane Ohara, PharmD, BCPS PGY2 Pharmacy Resident

## 2023-12-24 NOTE — Progress Notes (Signed)
 [facility] Progress Note   Subjective:    Last HD on 2/28 with 1.9 kg UF.  Spoke with her husband at bedside and he has a family member on the phone.  The family member on the phone is frustrated that we didn't anticipate that she would have more shortness of breath when we laid her back in bed a bit yesterday to move her up in bed.  Her husband stated that she recovered and was more alert yesterday overall than.   Review of systems:      Unable to obtain secondary to patient factors.  She is altered and nonverbal today   Objective Vitals:   12/24/23 0500 12/24/23 0736 12/24/23 0800 12/24/23 0806  BP:  (!) 130/41 (!) 129/50   Pulse:  81 78   Resp:  (!) 27 (!) 23   Temp:  98.9 F (37.2 C)    TempSrc:  Oral    SpO2:  97% 100% 99%  Weight: 68 kg     Height:       Physical Exam   General elderly female chronically ill appearing; in bed in no acute distress HEENT normocephalic atraumatic extraocular movements intact sclera anicteric Neck supple trachea midline Lungs clear but reduced on auscultation bilaterally normal work of breathing at rest on 2 liters supplemental oxygen Heart S1S2 no rub Abdomen soft nontender nondistended Extremities no edema appreciated; right BKA  Psych no anxiety or agitation Neuro -nonverbal; opens eyes to exam. Does not answer questions or follow commands Access RIJ tunn catheter; right AVF with bruit and thrill     Dialysis Orders: MWF NW  4h   B350    75.5kg   2/2 bath   R fem TDC  Heparin 2600 + 1400 midrun - last HD 1/22, getting to dry wt - venofer 100 qhd thru 1/31 - mircera 150 q2, last 1/17, due 1/31  Renal-related home meds: - rocaltrol 0.5 mwf - sensipar 30 every day - hydralazine 100 tid - losartan 50 bid - renvela 800mg  ac tid - others: brilinta, crestor, keppra, ritalin, neurontin, asa, PPI, dulera, insulin, albuterol, elavil  CXR 2/18, 2/22 - small R effusion, no edema/ congestion   Assessment/Plan: Acute  hypoxic respiratory failure - multifactorial with +COVID/ +RSV pna/ volume^: S/P course of abx + and we are optimizing volume status with HD.  Antibiotics per primary team discretion   CVA: R then L cerebellar infarcts d/t small vessel disease. Per neurology.  On aspirin and plavix for now  AMS: multifactorial.  Slow improvement  Aspiration pneumonitis - Speech is following.  Optimize volume with HD - continued oxygen requirement.  Nutrition per primary team  ESRD: on HD MWF schedule.  HTN - controlled.  Follow for need to further reduce hydralazine Anemia of esrd: Last ESA on 2/17.  Increased aranesp to 200 mcg every Monday.  Tsat 26% and ferritin 1638.  Getting po iron.  Transfuse as needed per primary  Secondary hyperparathyroidism -  phos ok on current regimen. Nepro is ordered but the nasal tube is out - not on feeds H/o seizure d/o - on Keppra PEA arrest - noted recent complicated history   Disposition - per primary team   Recent Labs  Lab 12/22/23 0552 12/23/23 0556 12/24/23 0550  HGB 7.6* 7.9* 7.4*  ALBUMIN 2.3*  --  2.3*  CALCIUM 9.3  --  10.1  PHOS 3.5  --  4.0  CREATININE 5.14*  --  4.59*  K 4.5  --  4.4    Inpatient medications:  arformoterol  15 mcg Nebulization BID   aspirin  81 mg Oral Daily   budesonide (PULMICORT) nebulizer solution  0.5 mg Nebulization BID   Chlorhexidine Gluconate Cloth  6 each Topical Q0600   clopidogrel  75 mg Oral Daily   [START ON 12/25/2023] darbepoetin (ARANESP) injection - DIALYSIS  200 mcg Subcutaneous Q Mon-1800   ferrous sulfate  300 mg Oral Daily   fiber supplement (BANATROL TF)  60 mL Oral BID   heparin injection (subcutaneous)  5,000 Units Subcutaneous Q8H   hydrALAZINE  50 mg Oral Q8H   insulin aspart  0-9 Units Subcutaneous TID WC   insulin glargine  6 Units Subcutaneous Daily   levETIRAcetam  500 mg Oral BID   melatonin  3 mg Oral QHS   multivitamin  1 tablet Oral QHS   nutrition supplement (JUVEN)  1 packet Per Tube BID BM    mouth rinse  15 mL Mouth Rinse Q4H   rosuvastatin  10 mg Oral Daily   sodium chloride flush  3 mL Intravenous Q12H    feeding supplement (NEPRO CARB STEADY) Stopped (12/22/23 0700)   acetaminophen **OR** acetaminophen, docusate, ipratropium-albuterol, labetalol, ondansetron (ZOFRAN) IV, mouth rinse, polyethylene glycol, Racepinephrine HCl, white petrolatum, witch hazel-glycerin     Estanislado Emms, MD 11:12 AM 12/24/2023

## 2023-12-24 NOTE — Progress Notes (Signed)
 PROGRESS NOTE                                                                                                                                                                                                             Patient Demographics:    Carolyn Russo, is a 73 y.o. female, DOB - 27-Jan-1951, ZHY:[id number]  Outpatient Primary MD for the patient is Annita Brod, MD    LOS - 38  Admit date - 11/16/2023    Chief Complaint  Patient presents with   Chest Pain   Dialysis pt       Brief Narrative (HPI from H&P)   73 yo female with ESRD on HD MWF, CHF, previous CVA with residual left sided weakness, T2DM, right BKA, and seizure disorder. She presented to [facility] on 1/23 with confusion, left sided chest pain, shortness of breath, and coughing. She was found to be hypoxic on room air and placed on 2 L [place]. CT chest was done without evidence of PE.  MRI revealed a 1.3cm acute to early subacute right PICA infarct. Also was admitted with pneumonia due to COVID and RSV and was started on antibiotics for likely superimposed bacterial infection.    On 1/30 early morning the patient was ~2 hrs into dialysis when her blood pressure dropped. UF goal was decreased but since the patient's level of consciousnesses decreased, she received 300 cc NS bolus. EKG changes were noted and rapid response was activated, dialysis stopped, and blood returned to the patient. Around 6:30 AM the patient was noted to be apneic without a pulse and code blue was called. Patient was noted to have PEA arrest. CPR was done for a few minutes before ROSC was achieved. She was transferred to 32M and patient was intubated due to failure to protect airway.  She continued to be tenuous and minimally responsive, finally she was extubated on 12/03/2023 transferred to my care on 12/09/2023, early in the morning she was fairly stable however within a few hours she had an episode of  emesis witnessed by her husband subsequently developed A-fib RVR and respiratory distress, cardiology PCCM again called bedside and she was transferred to ICU, she stayed there for another 5 days stabilized however still dependent on NG tube feeds, was seen by palliative care.  She was transferred to my service on 12/16/2023 again.  Overall condition report is extremely tenuous, still very high risk for aspiration, still overall extremely weak and deconditioned, family for now wants to pursue all modalities of treatment.  She is currently on NG tube, needs 5-day vacation from DAPT to get a peg tube.   Significant Hospital Events & Prcedures:    1/23 admit to [facility] for acute infarct + covid/rsv pneumonia 1/30 admit to ICU after patient had PEA arrest  2/1 Failed SBT due to apnea. ABG with respiratory alkalosis. RR decreased 2/2 Tolerating SBT. Remains encephalopathic 2/5 Continues to tolerate SBT during day, but remains encephalopathic/unable to protect airway if extubated  2/9 extubated, off precedex 2/12 still lethargic, ABG 2/11 unremarkable, limit centrally acting meds 2/15 transferred to my care, still somnolent on NG tube feeds, aspirated her vomitus, again in respiratory distress, considered back to ICU 12/14/2023 transferred by from ICU, transferred to my service on 12/16/2023 12/19/2023.  Modified barium swallow.  Started on dysphagia 1 diet with honey thick liquids 12/23/2023.  NG tube discontinued 12/24/2023.  CT chest.   Unfortunately overall patient continues to appear quite weak and frail, still NG tube feeding dependent, very weak cough reflex, very high risk for recurrent aspiration of oral secretions and regurgitated food material, overall still bedbound, overall prognosis appears poor explained to patient's husband clearly bedside on 12/17/2023.  Called and discussed with son on 12/18/2023 as well.    Subjective:   Patient in bed, appears comfortable, denies any headache, no fever, no  chest pain or pressure, no shortness of breath , cough, no abdominal pain.   Assessment  & Plan :    PEA arrest: due to hypotension during HD, not primarily cardiac in nature, intubated for airway protection and stabilized in ICU, seen by cardiology initially, now stable from cardiac arrest standpoint.  Acute encephalopathy: secondary to post-arrest, stroke, metabolic derangements, and drug induced. Concern for cefepime toxicity given her hx of ESRD, med was discontinued 2/4. MRI brain did show a new and acute central left cerebellar infarct.  Appreciate neurology recs.  Stroke not felt to be significant enough to explain altered mental status, Seroquel and amitriptyline have been stopped, mentation gradually improving now.  Still unable to swallow safely still NG tube bound on 12/16/2023.  Aspiration on 12/09/2023 followed by acute hypoxic respiratory failure with respiratory distress and A-fib RVR.  Treated in ICU, now off of antibiotics, still on NG tube feeds, still has dysphagia, still high risk for aspiration of oral contents, please see below.  Mild off-and-on leukocytosis, borderline elevated inflammatory markers, chest x-ray inconclusive, will check CT chest to rule out any ongoing aspiration on 12/24/2023.    Acute to early subacute right PICA infarct on 1/24, resolved New acute central left cerebellar infarct with dysphagia, previous residual left-sided hemiparesis - Appreciate neuro follow up , on DAPT with aspirin and brilinta along with statin, PEG tube held back per family request, currently on dysphagia 1 diet with honey thick liquids per SLP since 12/20/2023, tolerated oral diet well for the last 3 days no clinical signs of aspiration discontinue NG tube after discussions with husband bedside on 12/23/2023.  - Needs 30 d heart monitor at discharge   Paroxysmal atrial fibrillation with question of possible underlying sick sinus syndrome.  Seen by cardiology.  Currently on oral amiodarone.   Long-term anticoagulation not recommended by cardiology this admission.  Hypertension  - Currently on hydralazine via tube.   Leukocytosis Sinusitis - MRI brain noted progressed mastoid effusions, she has finished her  treatment with Augmentin   Pneumonia due to COVID and RSV - Productive Cough, subsequently aspiration pneumonia.  Initial treatment for this problem off of antibiotics now.  Will try and keep tube feed rate around 35 cc an hour as she tends to aspirate on higher rate of tube feed.  Abnormal C6 signal MRI showed a nonspecific bone marrow signal of c6 vertebral body - unclear if related to a malignancy vs discitis/osteomyelitis. Will hold off on C spine imaging, as family does not want to pursue this workup and priority is mental status/neuro recovery.    If no other findings, consider C spine MRI to further evaluate   Seizure disorder  - Continue home keppra 500 mg BID seen by neurology this admission.   ESRD on HD MWF - Dialysis per nephro - Discontinued renvela due to low phos levels   Anemia of chronic disease - 1 unit blood on 2/10, Hgb stable - trend CBC - Aranesp every Friday    Diabetes - Continue long-acting insulin along with ISS, dose adjusted due to change in diet on 12/22/2023  CBG (last 3)  Recent Labs    12/24/23 0011 12/24/23 0441 12/24/23 0824  GLUCAP 178* 207* 161*         Condition - Extremely Guarded  Family Communication  :    Updated son [phone]  in detail on 12/18/2023, wants to hold off on PEG tube till 2/29/2025, called 12/20/2023 at 8:20 AM.  No response.  Husband x 2 on 12/09/23, husband bedside 12/17/2023, called [phone] and left message on 12/20/2023 at 8:24 AM, called [phone] - updated   Code Status :  Full  Consults  :  PCCM, Cards, Pall Care, Renal,     PUD Prophylaxis : PPI   Procedures  :            Disposition Plan  :    Status is: Inpatient    DVT Prophylaxis  :    SCDs Start: 11/23/23  0716 heparin injection 5,000 Units Start: 11/17/23 0930     Lab Results  Component Value Date   PLT 552 (H) 12/24/2023    Diet :  Diet Order             DIET - DYS 1 Room service appropriate? No; Fluid consistency: Honey Thick  Diet effective now                    Inpatient Medications  Scheduled Meds:  arformoterol  15 mcg Nebulization BID   aspirin  81 mg Oral Daily   budesonide (PULMICORT) nebulizer solution  0.5 mg Nebulization BID   Chlorhexidine Gluconate Cloth  6 each Topical Q0600   clopidogrel  75 mg Oral Daily   [START ON 12/25/2023] darbepoetin (ARANESP) injection - DIALYSIS  200 mcg Subcutaneous Q Mon-1800   ferrous sulfate  300 mg Oral Daily   fiber supplement (BANATROL TF)  60 mL Oral BID   heparin injection (subcutaneous)  5,000 Units Subcutaneous Q8H   hydrALAZINE  50 mg Oral Q8H   insulin aspart  0-9 Units Subcutaneous TID WC   insulin glargine  6 Units Subcutaneous Daily   levETIRAcetam  500 mg Oral BID   melatonin  3 mg Oral QHS   multivitamin  1 tablet Oral QHS   nutrition supplement (JUVEN)  1 packet Per Tube BID BM   mouth rinse  15 mL Mouth Rinse Q4H   rosuvastatin  10 mg Oral Daily   sodium chloride  flush  3 mL Intravenous Q12H   Continuous Infusions:  feeding supplement (NEPRO CARB STEADY) Stopped (12/22/23 0700)   PRN Meds:.acetaminophen **OR** acetaminophen, docusate, ipratropium-albuterol, labetalol, ondansetron (ZOFRAN) IV, mouth rinse, polyethylene glycol, Racepinephrine HCl, white petrolatum, witch hazel-glycerin    Objective:   Vitals:   12/24/23 0500 12/24/23 0736 12/24/23 0800 12/24/23 0806  BP:  (!) 130/41 (!) 129/50   Pulse:  81 78   Resp:  (!) 27 (!) 23   Temp:  98.9 F (37.2 C)    TempSrc:  Oral    SpO2:  97% 100% 99%  Weight: 68 kg     Height:        Wt Readings from Last 3 Encounters:  12/24/23 68 kg  11/01/23 79.5 kg  08/24/23 83 kg     Intake/Output Summary (Last 24 hours) at 12/24/2023 1042 Last data  filed at 12/23/2023 2108 Gross per 24 hour  Intake 3 ml  Output --  Net 3 ml       Physical Exam  Awake and alert this morning, following all commands,  R BKA [place].AT,PERRAL Supple Neck, No JVD,   Symmetrical Chest wall movement, Good air movement bilaterally, bilateral breath sounds RRR,No Gallops,Rubs or new Murmurs,  +ve B.Sounds, Abd Soft, No tenderness,   No Cyanosis, Clubbing or edema     RN pressure injury documentation: Pressure Injury 12/02/23 Sacrum Medial Stage 2 -  Partial thickness loss of dermis presenting as a shallow open injury with a red, pink wound bed without slough. (Active)  12/02/23 1402  Location: Sacrum  Location Orientation: Medial  Staging: Stage 2 -  Partial thickness loss of dermis presenting as a shallow open injury with a red, pink wound bed without slough.  Wound Description (Comments):   Present on Admission: No  Dressing Type Foam - Lift dressing to assess site every shift 12/24/23 0000      Data Review:    Recent Labs  Lab 12/20/23 0459 12/21/23 0455 12/22/23 0552 12/23/23 0556 12/24/23 0550  WBC 19.9* 21.0* 19.2* 17.0* 23.8*  HGB 7.6* 8.2* 7.6* 7.9* 7.4*  HCT 22.5* 24.1* 22.2* 23.5* 22.1*  PLT 509* 479* 491* 518* 552*  MCV 93.0 93.4 92.5 92.9 92.9  MCH 31.4 31.8 31.7 31.2 31.1  MCHC 33.8 34.0 34.2 33.6 33.5  RDW 17.8* 17.8* 17.1* 17.2* 16.9*  LYMPHSABS 2.8 3.8 3.4 2.9 3.7  MONOABS 2.3* 2.9* 2.1* 2.1* 2.6*  EOSABS 0.3 0.2 0.3 0.2 0.1  BASOSABS 0.1 0.1 0.1 0.1 0.1    Recent Labs  Lab 12/18/23 0538 12/18/23 0539 12/18/23 1114 12/19/23 0439 12/20/23 1025 12/21/23 0925 12/22/23 0552 12/23/23 0556 12/24/23 0550  NA  --  138  --  133* 132*  --  133*  --  136  K  --  5.5*  --  4.9 4.9  --  4.5  --  4.4  CL  --  98  --  97* 95*  --  94*  --  92*  CO2  --  28  --  27 26  --  23  --  25  ANIONGAP  --  12  --  9 11  --  16*  --  19*  GLUCOSE  --  173*  --  158* 137*  --  103*  --  202*  BUN  --  94*  --  61* 98*  --  94*  --   81*  CREATININE  --  5.67*  --  3.72* 5.35*  --  5.14*  --  4.59*  ALBUMIN  --   --   --  2.4*  --   --  2.3*  --  2.3*  CRP  --   --    < >  --  4.2* 7.8* 7.5* 8.8* 12.8*  PROCALCITON 1.47  --   --   --  2.04 2.26 2.28 1.65 1.82  BNP 138.7*  --   --   --   --   --   --   --   --   PHOS  --   --   --  3.6  --   --  3.5  --  4.0  CALCIUM  --  9.8  --  9.2 9.0  --  9.3  --  10.1   < > = values in this interval not displayed.      Recent Labs  Lab 12/18/23 0538 12/18/23 0539 12/18/23 1114 12/19/23 0439 12/20/23 1025 12/21/23 0925 12/22/23 0552 12/23/23 0556 12/24/23 0550  CRP  --   --    < >  --  4.2* 7.8* 7.5* 8.8* 12.8*  PROCALCITON 1.47  --   --   --  2.04 2.26 2.28 1.65 1.82  BNP 138.7*  --   --   --   --   --   --   --   --   CALCIUM  --  9.8  --  9.2 9.0  --  9.3  --  10.1   < > = values in this interval not displayed.    --------------------------------------------------------------------------------------------------------------- Lab Results  Component Value Date   CHOL 90 11/17/2023   HDL 38 (L) 11/17/2023   LDLCALC 37 11/17/2023   TRIG 86 12/02/2023   CHOLHDL 2.4 11/17/2023    Lab Results  Component Value Date   HGBA1C 5.8 (H) 10/26/2023    Micro Results No results found for this or any previous visit (from the past 240 hours).  Radiology Report DG Chest Port 1 View Result Date: 12/24/2023 CLINICAL DATA:  Shortness of breath EXAM: PORTABLE CHEST 1 VIEW COMPARISON:  12/21/2023 FINDINGS: Reverse apical lordotic positioning. Dialysis catheter tip at low right atrium. Removal of feeding tube. Moderate cardiomegaly. Pleuroparenchymal opacification in the inferior left hemithorax is similar. Subsegmental atelectasis at the right lung base is unchanged. No pneumothorax. IMPRESSION: 1. Similar pleuroparenchymal opacification in the inferior left hemithorax. Likely trace pleural fluid and adjacent airspace disease. 2.  Cardiomegaly without congestive failure.  Electronically Signed   By: Jeronimo Greaves M.D.   On: 12/24/2023 10:20      Signature  -   Susa Raring M.D on 12/24/2023 at 10:42 AM   -  To page go to [email or web address]

## 2023-12-24 NOTE — Plan of Care (Signed)
  Problem: Coping: Goal: Ability to adjust to condition or change in health will improve Outcome: Not Progressing   Problem: Fluid Volume: Goal: Ability to maintain a balanced intake and output will improve Outcome: Not Progressing   Problem: Health Behavior/Discharge Planning: Goal: Ability to identify and utilize available resources and services will improve Outcome: Not Progressing Goal: Ability to manage health-related needs will improve Outcome: Not Progressing   Problem: Metabolic: Goal: Ability to maintain appropriate glucose levels will improve Outcome: Not Progressing   Problem: Nutritional: Goal: Maintenance of adequate nutrition will improve Outcome: Not Progressing Goal: Progress toward achieving an optimal weight will improve Outcome: Not Progressing   Problem: Skin Integrity: Goal: Risk for impaired skin integrity will decrease Outcome: Not Progressing   Problem: Education: Goal: Knowledge of disease or condition will improve Outcome: Not Progressing Goal: Knowledge of secondary prevention will improve (MUST DOCUMENT ALL) Outcome: Not Progressing Goal: Knowledge of patient specific risk factors will improve (DELETE if not current risk factor) Outcome: Not Progressing   Problem: Ischemic Stroke/TIA Tissue Perfusion: Goal: Complications of ischemic stroke/TIA will be minimized Outcome: Not Progressing   Problem: Coping: Goal: Will verbalize positive feelings about self Outcome: Not Progressing Goal: Will identify appropriate support needs Outcome: Not Progressing   Problem: Self-Care: Goal: Ability to participate in self-care as condition permits will improve Outcome: Not Progressing Goal: Verbalization of feelings and concerns over difficulty with self-care will improve Outcome: Not Progressing Goal: Ability to communicate needs accurately will improve Outcome: Not Progressing   Problem: Nutrition: Goal: Dietary intake will improve Outcome: Not  Progressing   Problem: Education: Goal: Knowledge of risk factors and measures for prevention of condition will improve Outcome: Not Progressing   Problem: Coping: Goal: Psychosocial and spiritual needs will be supported Outcome: Not Progressing   Problem: Respiratory: Goal: Will maintain a patent airway Outcome: Not Progressing Goal: Complications related to the disease process, condition or treatment will be avoided or minimized Outcome: Not Progressing   Problem: Education: Goal: Knowledge of General Education information will improve Description: Including pain rating scale, medication(s)/side effects and non-pharmacologic comfort measures Outcome: Not Progressing   Problem: Health Behavior/Discharge Planning: Goal: Ability to manage health-related needs will improve Outcome: Not Progressing   Problem: Clinical Measurements: Goal: Ability to maintain clinical measurements within normal limits will improve Outcome: Not Progressing Goal: Will remain free from infection Outcome: Not Progressing Goal: Diagnostic test results will improve Outcome: Not Progressing Goal: Respiratory complications will improve Outcome: Not Progressing Goal: Cardiovascular complication will be avoided Outcome: Not Progressing   Problem: Coping: Goal: Level of anxiety will decrease Outcome: Not Progressing   Problem: Elimination: Goal: Will not experience complications related to bowel motility Outcome: Not Progressing Goal: Will not experience complications related to urinary retention Outcome: Not Progressing   Problem: Pain Managment: Goal: General experience of comfort will improve and/or be controlled Outcome: Not Progressing   Problem: Safety: Goal: Ability to remain free from injury will improve Outcome: Not Progressing   Problem: Skin Integrity: Goal: Risk for impaired skin integrity will decrease Outcome: Not Progressing   Problem: Activity: Goal: Ability to tolerate  increased activity will improve Outcome: Not Progressing   Problem: Respiratory: Goal: Ability to maintain a clear airway and adequate ventilation will improve Outcome: Not Progressing

## 2023-12-25 ENCOUNTER — Inpatient Hospital Stay (HOSPITAL_COMMUNITY)

## 2023-12-25 ENCOUNTER — Ambulatory Visit (HOSPITAL_BASED_OUTPATIENT_CLINIC_OR_DEPARTMENT_OTHER): Payer: Medicare PPO | Admitting: Family

## 2023-12-25 ENCOUNTER — Telehealth (HOSPITAL_BASED_OUTPATIENT_CLINIC_OR_DEPARTMENT_OTHER): Payer: Self-pay | Admitting: Cardiovascular Disease

## 2023-12-25 DIAGNOSIS — N186 End stage renal disease: Secondary | ICD-10-CM | POA: Diagnosis not present

## 2023-12-25 DIAGNOSIS — J9601 Acute respiratory failure with hypoxia: Secondary | ICD-10-CM | POA: Diagnosis not present

## 2023-12-25 DIAGNOSIS — Z992 Dependence on renal dialysis: Secondary | ICD-10-CM | POA: Diagnosis not present

## 2023-12-25 DIAGNOSIS — G9340 Encephalopathy, unspecified: Secondary | ICD-10-CM | POA: Diagnosis not present

## 2023-12-25 DIAGNOSIS — T17908A Unspecified foreign body in respiratory tract, part unspecified causing other injury, initial encounter: Secondary | ICD-10-CM

## 2023-12-25 DIAGNOSIS — J9811 Atelectasis: Secondary | ICD-10-CM

## 2023-12-25 LAB — CBC WITH DIFFERENTIAL/PLATELET
Abs Immature Granulocytes: 0.3 10*3/uL — ABNORMAL HIGH (ref 0.00–0.07)
Basophils Absolute: 0.1 10*3/uL (ref 0.0–0.1)
Basophils Relative: 0 %
Eosinophils Absolute: 0.2 10*3/uL (ref 0.0–0.5)
Eosinophils Relative: 1 %
HCT: 21.9 % — ABNORMAL LOW (ref 36.0–46.0)
Hemoglobin: 7.4 g/dL — ABNORMAL LOW (ref 12.0–15.0)
Immature Granulocytes: 1 %
Lymphocytes Relative: 14 %
Lymphs Abs: 3.9 10*3/uL (ref 0.7–4.0)
MCH: 31.6 pg (ref 26.0–34.0)
MCHC: 33.8 g/dL (ref 30.0–36.0)
MCV: 93.6 fL (ref 80.0–100.0)
Monocytes Absolute: 2.6 10*3/uL — ABNORMAL HIGH (ref 0.1–1.0)
Monocytes Relative: 9 %
Neutro Abs: 21.9 10*3/uL — ABNORMAL HIGH (ref 1.7–7.7)
Neutrophils Relative %: 75 %
Platelets: 577 10*3/uL — ABNORMAL HIGH (ref 150–400)
RBC: 2.34 MIL/uL — ABNORMAL LOW (ref 3.87–5.11)
RDW: 16.7 % — ABNORMAL HIGH (ref 11.5–15.5)
WBC: 29 10*3/uL — ABNORMAL HIGH (ref 4.0–10.5)
nRBC: 0 % (ref 0.0–0.2)

## 2023-12-25 LAB — GLUCOSE, CAPILLARY
Glucose-Capillary: 115 mg/dL — ABNORMAL HIGH (ref 70–99)
Glucose-Capillary: 135 mg/dL — ABNORMAL HIGH (ref 70–99)
Glucose-Capillary: 163 mg/dL — ABNORMAL HIGH (ref 70–99)
Glucose-Capillary: 167 mg/dL — ABNORMAL HIGH (ref 70–99)
Glucose-Capillary: 181 mg/dL — ABNORMAL HIGH (ref 70–99)
Glucose-Capillary: 183 mg/dL — ABNORMAL HIGH (ref 70–99)
Glucose-Capillary: 237 mg/dL — ABNORMAL HIGH (ref 70–99)

## 2023-12-25 LAB — C-REACTIVE PROTEIN: CRP: 14.2 mg/dL — ABNORMAL HIGH (ref <1.0)

## 2023-12-25 LAB — RENAL FUNCTION PANEL
Albumin: 2.4 g/dL — ABNORMAL LOW (ref 3.5–5.0)
Anion gap: 19 — ABNORMAL HIGH (ref 5–15)
BUN: 106 mg/dL — ABNORMAL HIGH (ref 8–23)
CO2: 22 mmol/L (ref 22–32)
Calcium: 9.3 mg/dL (ref 8.9–10.3)
Chloride: 91 mmol/L — ABNORMAL LOW (ref 98–111)
Creatinine, Ser: 6.37 mg/dL — ABNORMAL HIGH (ref 0.44–1.00)
GFR, Estimated: 6 mL/min — ABNORMAL LOW (ref >60)
Glucose, Bld: 166 mg/dL — ABNORMAL HIGH (ref 70–99)
Phosphorus: 4.3 mg/dL (ref 2.5–4.6)
Potassium: 4.5 mmol/L (ref 3.5–5.1)
Sodium: 132 mmol/L — ABNORMAL LOW (ref 135–145)

## 2023-12-25 LAB — PROCALCITONIN: Procalcitonin: 1.81 ng/mL

## 2023-12-25 MED ORDER — PROSOURCE TF20 ENFIT COMPATIBL EN LIQD
60.0000 mL | Freq: Every day | ENTERAL | Status: DC
  Administered 2023-12-25 – 2023-12-28 (×4): 60 mL
  Filled 2023-12-25 (×4): qty 60

## 2023-12-25 MED ORDER — MEDIHONEY WOUND/BURN DRESSING EX PSTE
1.0000 | PASTE | Freq: Every day | CUTANEOUS | Status: DC
  Administered 2023-12-25 – 2023-12-28 (×4): 1 via TOPICAL
  Filled 2023-12-25 (×2): qty 44

## 2023-12-25 NOTE — Telephone Encounter (Signed)
 Message given to Dr Duke Salvia, stated she would reach out later today Advised Dr Harlon Flor

## 2023-12-25 NOTE — Progress Notes (Signed)
 Nutrition Follow-up  DOCUMENTATION CODES:   Not applicable  INTERVENTION:  Continue with Banatrol TID to assist with management of loose stools. Each packet contains 5g of soluble fiber. Continue with -1 packet Juven BID, each packet provides 95 calories, 2.5 grams of protein (collagen), and 9.8 grams of carbohydrate (3 grams sugar); also contains 7 grams of L-arginine and L-glutamine, 300 mg vitamin C, 15 mg vitamin E, 1.2 mcg vitamin B-12, 9.5 mg zinc, 200 mg calcium, and 1.5 g  Calcium Beta-hydroxy-Beta-methylbutyrate to support wound healing Multivitamin/minerals Initiate tube feeding via cortrak: Nepro at 40 ml/h (960 ml per day) Initiate feeding at 67ml/h increase rate by 10ml every 12 hrs till goal rate is met.  Prosource TF20 60 ml daily  Provides 1808 kcal, 98 gm protein, 698 ml free water daily   NUTRITION DIAGNOSIS:   Inadequate oral intake related to acute illness as evidenced by NPO status.    GOAL:   Patient will meet greater than or equal to 90% of their needs    MONITOR:   TF tolerance, I & O's, Labs, Weight trends  REASON FOR ASSESSMENT:   Ventilator    ASSESSMENT:   Pt  presented 1/23 with confusion and worsening L chest pain secondary to COVID and RSV infection. PMH significant for ESRD on HD, CHF, CVA with residual L sided weakness, T2DM, R BKA, seizure disorder.  Patient awake at time of visit.  Confused and hard to understand.  Kept stating she was cold pulled blanket up over shoulders and she said that was better. Poor historian with oral intake, Cortrak in place pending restart. patient continues to appear quite weak and frail, sleepy NG tube feeding dependent, very weak cough reflex, very high risk for recurrent aspiration of oral secretions and regurgitated food material, overall still bed bound. Cortrak was removed with hopes of oral intake. Do to aspiration risk she is now NPO,  family wanted to start Cortrak again. With possible PEG placement.    1/23 admit to [facility] for acute infarct + covid/rsv pneumonia 1/30 admit to ICU after patient had PEA arrest  2/1 Failed SBT due to apnea. ABG with respiratory alkalosis. RR decreased 2/2 Tolerating SBT. Remains encephalopathic 2/5 Continues to tolerate SBT during day, but remains encephalopathic/unable to protect airway if extubated  2/9 extubated, off precedex 2/12 still lethargic, ABG 2/11 unremarkable, limit centrally acting meds 2/15 transferred to my care, still somnolent on NG tube feeds, aspirated her vomitus, again in respiratory distress, considered back to ICU 12/14/2023 transferred by from ICU, transferred to my service on 12/16/2023 12/19/2023.  Modified barium swallow.  Started on dysphagia 1 diet with honey thick liquids 12/23/2023.  NG tube discontinued 12/24/2023.  CT chest. 12/25/2023. Reinsert cortrak NPO  Hospital weight history: 12/25/23 0500 67.9 kg 149.69 lbs  12/24/23 0500 68 kg 149.91 lbs  12/23/23 0500 65.3 kg 143.96 lbs  12/22/23 0500 69.5 kg 153.22 lbs  12/21/23 0453 68.5 kg 151.02 lbs  12/20/23 1425 66.6 kg  146.83 lbs  12/20/23 0935 68.6 kg 151.24 lbs  12/19/23 0500 70.9 kg 156.31 lbs  12/18/23 1340 70 kg 154.32 lbs  12/18/23 1044 70.9 kg 156.31 lbs  12/18/23 0500 70.9 kg 156.31 lbs  12/17/23 0500 70.5 kg 155.42 lbs  12/16/23 0500 72.2 kg 159.17 lbs  12/15/23 2156 72.2 kg 159.17 lbs  12/15/23 0500 69.3 kg 152.78 lbs  12/14/23 1201 71.3 kg 157.19 lbs  12/14/23 0332 72.4 kg 159.61 lbs  12/13/23 0500 71.1 kg 156.75 lbs  12/12/23  0740 74 kg 163.14 lbs  12/12/23 0500 74 kg 163.14 lbs  12/11/23 0500 73.6 kg 162.26 lbs  12/10/23 1500 72.6 kg 160.05 lbs  12/09/23 1231 51.4 kg 113.32 lbs  12/09/23 0437 74 kg 163.14 lbs  12/08/23 1838 70.5 kg 155.42 lbs  12/08/23 1341 72 kg 158.73 lbs  12/08/23 0500 72 kg 158.73 lbs  12/07/23 0500 72 kg 158.73 lbs  12/06/23 0945 72 kg 158.73 lbs  12/05/23 0152 73.1 kg 161.16 lbs  12/04/23 1330 73.8 kg 162.7 lbs  12/04/23 0917  71.8 kg 158.29 lbs  12/03/23 0400 71.8 kg 158.29 lbs  12/02/23 0509 71.8 kg 158.29 lbs  12/01/23 0500 70.6 kg 155.65 lbs  11/30/23 1228 70.6 kg 155.65 lbs  11/30/23 0839 70.6 kg 155.65 lbs  11/30/23 0500 70.6 kg 155.65 lbs  11/29/23 0500 70 kg 154.32 lbs  11/28/23 1409 70 kg 154.32 lbs  11/28/23 1039 71 kg 156.53 lbs  11/28/23 0431 71 kg 156.53 lbs  11/27/23 0500 69.7 kg 153.66 lbs  11/26/23 0533 73 kg 160.94 lbs  11/26/23 0151 73.9 kg 162.92 lbs  11/20/23 1900 76.8 kg 169.31 lbs  11/20/23 1500 79.5 kg 175.27 lbs  11/18/23 0500 79.5 kg 175.27 lbs  11/17/23 1900 79.5 kg 175.27 lbs   Last Weight  Most recent update: 12/25/2023  5:43 AM    Weight  67.9 kg (149 lb 11.1 oz)             Average Meal Intake: NPO  Nutritionally Relevant Medications: Scheduled Meds:  clopidogrel  75 mg Oral Daily   darbepoetin (ARANESP) injection - DIALYSIS  200 mcg Subcutaneous Q Mon-1800   ferrous sulfate  300 mg Oral Daily   fiber supplement (BANATROL TF)  60 mL Oral BID   hydrALAZINE  50 mg Oral Q8H   multivitamin  1 tablet Oral QHS   nutrition supplement (JUVEN)  1 packet Per Tube BID BM    Continuous Infusions:  PRN Meds:.acetaminophen **OR** acetaminophen, docusate, ipratropium-albuterol, labetalol, ondansetron (ZOFRAN) IV, mouth rinse, polyethylene glycol, Racepinephrine HCl, white petrolatum, witch hazel-glycerin  Labs Reviewed    NUTRITION - FOCUSED PHYSICAL EXAM:  Flowsheet Row Most Recent Value  Orbital Region Mild depletion  Upper Arm Region Mild depletion  Thoracic and Lumbar Region No depletion  Buccal Region No depletion  Temple Region No depletion  Clavicle Bone Region No depletion  Clavicle and Acromion Bone Region No depletion  Scapular Bone Region Unable to assess  Dorsal Hand Unable to assess  Patellar Region Moderate depletion  Anterior Thigh Region Moderate depletion  Posterior Calf Region Moderate depletion  Edema (RD Assessment) Mild  Hair Reviewed  Eyes  Reviewed  Mouth Reviewed  Skin Reviewed  Nails Reviewed       Diet Order:   Diet Order             Diet NPO time specified Except for: Sips with Meds  Diet effective now                   EDUCATION NEEDS:   No education needs have been identified at this time  Skin:  Skin Assessment: Reviewed RN Assessment Skin Integrity Issues:: Stage II Stage II: medial sacrum  Last BM:  12/23/23  Height:   Ht Readings from Last 1 Encounters:  11/28/23 5\' 3"  (1.6 m)    Weight:   Wt Readings from Last 1 Encounters:  12/25/23 67.9 kg    Ideal Body Weight:  52.3 kg  BMI:  Body  mass index is 26.52 kg/m.  Estimated Nutritional Needs:   Kcal:  1600-1800 kcal/d  Protein:  80-100 g/d  Fluid:  1L + UOP    Jamelle Haring RDN, LDN Clinical Dietitian   If unable to reach, please contact "RD Inpatient" secure chat group between 8 am-4 pm daily"

## 2023-12-25 NOTE — Progress Notes (Signed)
 Asked by TRH to eval picture of sacral wound. TRH reports no current concerns of infection of the sacral wound. No recent CT imaging of pelvis in the last 30d. WOCN is following. Reviewed with attending. Would recommend continuing to follow WOCN recommendations.   Elmer Sow Shaneque Merkle 12:21 PM, 12/25/23

## 2023-12-25 NOTE — Telephone Encounter (Signed)
 New Message:       Patient's husband says when you have time, please give him a call. He says he need to ask you a question please.

## 2023-12-25 NOTE — Plan of Care (Signed)
  Problem: Metabolic: Goal: Ability to maintain appropriate glucose levels will improve Outcome: Progressing   Problem: Skin Integrity: Goal: Risk for impaired skin integrity will decrease Outcome: Progressing   Problem: Health Behavior/Discharge Planning: Goal: Goals will be collaboratively established with patient/family Outcome: Progressing   Problem: Respiratory: Goal: Will maintain a patent airway Outcome: Progressing   Problem: Clinical Measurements: Goal: Will remain free from infection Outcome: Progressing Goal: Diagnostic test results will improve Outcome: Progressing

## 2023-12-25 NOTE — TOC Progression Note (Signed)
 Transition of Care [facility]) - Progression Note    Patient Details  Name: Carolyn Russo MRN: [id number] Date of Birth: 05/21/1951  Transition of [facility] CM/SW Contact  Mearl Latin, LCSW Phone Number: 12/25/2023, 12:56 PM  Clinical Narrative:    CSW continuing to follow.    Expected Discharge Plan: Skilled Nursing Facility Barriers to Discharge: Continued Medical Work up, English as a second language teacher  Expected Discharge Plan and Services In-house Referral: Clinical Social Work   Post Acute Care Choice: Skilled Nursing Facility Living arrangements for the past 2 months: Single Family Home, Skilled Nursing Facility                                       Social Determinants of Health (SDOH) Interventions SDOH Screenings   Food Insecurity: No Food Insecurity (11/17/2023)  Housing: Low Risk  (11/17/2023)  Transportation Needs: No Transportation Needs (11/17/2023)  Utilities: Not At Risk (11/17/2023)  Alcohol Screen: Low Risk  (01/09/2019)  Depression (PHQ2-9): Low Risk  (09/11/2023)  Financial Resource Strain: Low Risk  (09/02/2021)  Physical Activity: Inactive (09/02/2021)  Social Connections: Socially Integrated (11/17/2023)  Stress: Stress Concern Present (09/02/2021)  Tobacco Use: Low Risk  (11/16/2023)    Readmission Risk Interventions    12/15/2023    2:57 PM 02/14/2023    4:54 PM  Readmission Risk Prevention Plan  Transportation Screening Complete Complete  PCP or Specialist Appt within 3-5 Days  Complete  HRI or Home Care Consult  Complete  Social Work Consult for Recovery Care Planning/Counseling  Complete  Palliative Care Screening  Complete  Medication Review Oceanographer) Complete Complete  PCP or Specialist appointment within 3-5 days of discharge Complete   HRI or Home Care Consult Complete   SW Recovery Care/Counseling Consult Complete   Palliative Care Screening Complete   Skilled Nursing Facility Complete

## 2023-12-25 NOTE — Progress Notes (Signed)
 Patient to be dialyzed today, awaiting dialysis RN for pickup. Patient had a stable shift today, Cortrak placed by dietitian and feeding started at 25 mls/hr, tolerating so far. Patient dressing done twice on shift due to wound care new orders. Some medication push to night shift because its patient dialysis day.

## 2023-12-25 NOTE — Progress Notes (Addendum)
 Patient left for CT. To have CT of the spine done.

## 2023-12-25 NOTE — Progress Notes (Signed)
 NAME:  Carolyn Russo, MRN:  [id number], DOB:  February 12, 1951, LOS: 39 ADMISSION DATE:  11/16/2023, CONSULTATION DATE:  1/30 REFERRING MD:  TRH, CHIEF COMPLAINT:  code blue   History of Present Illness:  Carolyn Russo is a 73 yo female She presented to [facility] on 1/23 with confusion, left sided chest pain, shortness of breath, and coughing. She was found to be hypoxic on room air and placed on 2 L [place]. CT chest was done without evidence of PE.  MRI revealed a 1.3cm acute to early subacute right PICA infarct. Also was admitted with pneumonia due to COVID and RSV and was started on antibiotics for likely superimposed bacterial infection.    On 1/30 early morning the patient was ~2 hrs into dialysis when her blood pressure dropped. UF goal was decreased but since the patient's level of consciousnesses decreased, she received 300 cc NS bolus. EKG changes were noted and rapid response was activated, dialysis stopped, and blood returned to the patient. Around 6:30 AM the patient was noted to be apneic without a pulse and code blue was called. Patient was noted to have PEA arrest. CPR was done for a few minutes before ROSC was achieved. She was transferred to 54M and patient was intubated due to failure to protect airway.  Pertinent  Medical History  ESRD/HD MWF CHF CVA with residual LEFT sided weakness T2DM RIGHT BKA Seizures  Significant Hospital Events: Including procedures, antibiotic start and stop dates in addition to other pertinent events   1/23 admit to [facility] for acute infarct + covid/rsv pneumonia 1/30 admit to ICU after patient had PEA arrest  2/1 Failed SBT due to apnea. ABG with respiratory alkalosis. RR decreased 2/2 Tolerating SBT. Remains encephalopathic 2/5 Continues to tolerate SBT during day, but remains encephalopathic/unable to protect airway if extubated  2/9 extubated, off precedex 2/12 still lethargic, ABG 2/11 unremarkable, limit centrally acting meds 2/15 transferred back to ICU  for atrial fibrillation with rapid ventricular response that developed after a gross aspiration event. 2/20 transferred out of ICU 2/25 MBS > dysphagia 1 diet 3/1 NGT removed 3/2 CT chest > new left sided rib fx's, similar atelectasis as prior LLL, RML and RLL opacities, severe disc space narrowing at C6-7 which could reflect discitis/osteomyelitis or hemodialysis associated spondyloartropathy, fluid filled and mildly distended esophagus. 3/3 Cortrak replaced. PCCM asked to weigh in again regarding prognosis etc. 3/4 CT C spine ordered  Interim History / Subjective:  NAEON. Sleepy, arouses to noxious stimuli but quickly falls back asleep. Does not answer any questions or follow any commands. Has intact cough/gag though weak.  Objective   Blood pressure (!) 156/50, pulse 79, temperature 97.8 F (36.6 C), temperature source Axillary, resp. rate 20, height 5\' 3"  (1.6 m), weight 67.9 kg, SpO2 96%.        Intake/Output Summary (Last 24 hours) at 12/25/2023 1411 Last data filed at 12/25/2023 0500 Gross per 24 hour  Intake 787.39 ml  Output --  Net 787.39 ml   Filed Weights   12/23/23 0500 12/24/23 0500 12/25/23 0500  Weight: 65.3 kg 68 kg 67.9 kg    Examination: General: Chronically ill appearing elderly female, in NAD. Neuro: Sleepy, arouses to noxious stimuli but falls back asleep quickly. Doesn't answer questions or follow commands. HEENT: [place]/AT. Sclerae anicteric. MMM. Cortrak in place. Cardiovascular: RRR, no M/R/G.  Lungs: Respirations even and unlabored.  CTA bilaterally, No W/R/R. Abdomen: BS x 4, soft, NT/ND.  Musculoskeletal: No gross deformities, no edema.  Skin: Sacral wounds, unstageable (see WOC note 3/3 and media for images).   Assessment & Plan:   Ongoing aspiration with NG feeding dependence - had been cleared for dysphagia diet 2/6 with understanding remained high risk for aspiration; however, back to NPO 3/2 after increasing leukocytosis and concern for ongoing  aspiration. Probable new aspiration PNA 3/3 as seen on CT chest, started on Unasyn. - Agree NPO. - Continue Unasyn. - ID consulted. - HOB > 30 at all times. - Frequent suctioning. - Encourage routine bronchial hygiene. - Poor prognosis here and remains at risk for worsening PNA and repeat intubation at some point given poor airway clearance/protection.  Goals of care: Unfortunately pt has had a prolonged hospitalization of 39 days with multiple ICU stays during that time period. She has multiple chronic preexisting illnesses prior to this admission and furthermore, she has since suffered a cardiac arrest, acute metabolic encephalopathy, ongoing aspiration, unstageable sacral/buttock wounds, rib fractures. She remains very frail and debilitated. Ongoing aggressive measures including heroics such as intubation, ACLS, etc will not provide any meaningful benefit here and will only cause further decline and setbacks. Strongly recommending DNR measures with comfort. Carolyn Russo whether she can be discharged with home hospice so that she can return to a familiar place with family around her etc.   Other issues per primary team. PEA arrest. PAF, HTN. Acute metabolic encephalopathy - 2/2 above, stroke, metabolic derangements.  Abnormal C6 signal. Seizures. ESRD on HD MWF. Anemia of chronic disease. DMII. Unstageable sacral/buttock wounds. New rib fx's on CT 3/2.   Nothing further to add.  PCCM will sign off.  Please call us back if we can be of any further assistance.   Best Practice (right click and "Reselect all SmartList Selections" daily)  Per primary team.  Rutherford Guys, PA - [facility] For pager details, please see AMION or use Epic chat  After 1900, please call ELINK for cross coverage needs 12/25/2023, 2:15 PM

## 2023-12-25 NOTE — Plan of Care (Signed)
  Problem: Coping: Goal: Ability to adjust to condition or change in health will improve Outcome: Progressing   Problem: Fluid Volume: Goal: Ability to maintain a balanced intake and output will improve Outcome: Progressing   Problem: Health Behavior/Discharge Planning: Goal: Ability to identify and utilize available resources and services will improve Outcome: Progressing Goal: Ability to manage health-related needs will improve Outcome: Progressing   Problem: Metabolic: Goal: Ability to maintain appropriate glucose levels will improve Outcome: Progressing   Problem: Nutritional: Goal: Maintenance of adequate nutrition will improve Outcome: Progressing Goal: Progress toward achieving an optimal weight will improve Outcome: Progressing   Problem: Skin Integrity: Goal: Risk for impaired skin integrity will decrease Outcome: Progressing   Problem: Education: Goal: Knowledge of disease or condition will improve Outcome: Progressing Goal: Knowledge of secondary prevention will improve (MUST DOCUMENT ALL) Outcome: Progressing Goal: Knowledge of patient specific risk factors will improve (DELETE if not current risk factor) Outcome: Progressing   Problem: Ischemic Stroke/TIA Tissue Perfusion: Goal: Complications of ischemic stroke/TIA will be minimized Outcome: Progressing   Problem: Coping: Goal: Will verbalize positive feelings about self Outcome: Progressing Goal: Will identify appropriate support needs Outcome: Progressing   Problem: Health Behavior/Discharge Planning: Goal: Ability to manage health-related needs will improve Outcome: Progressing Goal: Goals will be collaboratively established with patient/family Outcome: Progressing   Problem: Self-Care: Goal: Ability to participate in self-care as condition permits will improve Outcome: Progressing Goal: Verbalization of feelings and concerns over difficulty with self-care will improve Outcome:  Progressing Goal: Ability to communicate needs accurately will improve Outcome: Progressing   Problem: Nutrition: Goal: Dietary intake will improve Outcome: Progressing   Problem: Education: Goal: Knowledge of risk factors and measures for prevention of condition will improve Outcome: Progressing   Problem: Education: Goal: Knowledge of risk factors and measures for prevention of condition will improve Outcome: Progressing   Problem: Coping: Goal: Psychosocial and spiritual needs will be supported Outcome: Progressing   Problem: Respiratory: Goal: Will maintain a patent airway Outcome: Progressing Goal: Complications related to the disease process, condition or treatment will be avoided or minimized Outcome: Progressing   Problem: Education: Goal: Knowledge of General Education information will improve Description: Including pain rating scale, medication(s)/side effects and non-pharmacologic comfort measures Outcome: Progressing   Problem: Health Behavior/Discharge Planning: Goal: Ability to manage health-related needs will improve Outcome: Progressing   Problem: Clinical Measurements: Goal: Ability to maintain clinical measurements within normal limits will improve Outcome: Progressing Goal: Will remain free from infection Outcome: Progressing Goal: Diagnostic test results will improve Outcome: Progressing Goal: Respiratory complications will improve Outcome: Progressing Goal: Cardiovascular complication will be avoided Outcome: Progressing   Problem: Activity: Goal: Risk for activity intolerance will decrease Outcome: Progressing   Problem: Nutrition: Goal: Adequate nutrition will be maintained Outcome: Progressing   Problem: Coping: Goal: Level of anxiety will decrease Outcome: Progressing   Problem: Elimination: Goal: Will not experience complications related to bowel motility Outcome: Progressing Goal: Will not experience complications related to  urinary retention Outcome: Progressing   Problem: Activity: Goal: Ability to tolerate increased activity will improve Outcome: Progressing   Problem: Respiratory: Goal: Ability to maintain a clear airway and adequate ventilation will improve Outcome: Progressing   Problem: Role Relationship: Goal: Method of communication will improve Outcome: Progressing

## 2023-12-25 NOTE — Procedures (Signed)
 Cortrak  Tube Type:  Cortrak - 43 inches Tube Location:  Left nare Initial Placement:  Stomach Secured by: Bridle Technique Used to Measure Tube Placement:  Marking at nare/corner of mouth Cortrak Secured At:  70 cm   Cortrak Tube Team Note:  Consult received to place a Cortrak feeding tube.   X-ray is required. RN may begin using tube once placement confirmed on x-ray.   If the tube becomes dislodged please keep the tube and contact the Cortrak team at [email or web address] for replacement.  If after hours and replacement cannot be delayed, place a NG tube and confirm placement with an abdominal x-ray.    Betsey Holiday MS, RD, LDN If unable to be reached, please send secure chat to "RD inpatient" available from 8:00a-4:00p daily

## 2023-12-25 NOTE — Progress Notes (Addendum)
 PROGRESS NOTE                                                                                                                                                                                                             Patient Demographics:    Carolyn Russo, is a 73 y.o. female, DOB - 25-Jul-1951, ZOX:[id number]  Outpatient Primary MD for the patient is Annita Brod, MD    LOS - 39  Admit date - 11/16/2023    Chief Complaint  Patient presents with   Chest Pain   Dialysis pt       Brief Narrative (HPI from H&P)   73 yo female with ESRD on HD MWF, CHF, previous CVA with residual left sided weakness, T2DM, right BKA, and seizure disorder. She presented to [facility] on 1/23 with confusion, left sided chest pain, shortness of breath, and coughing. She was found to be hypoxic on room air and placed on 2 L [place]. CT chest was done without evidence of PE.  MRI revealed a 1.3cm acute to early subacute right PICA infarct. Also was admitted with pneumonia due to COVID and RSV and was started on antibiotics for likely superimposed bacterial infection.    On 1/30 early morning the patient was ~2 hrs into dialysis when her blood pressure dropped. UF goal was decreased but since the patient's level of consciousnesses decreased, she received 300 cc NS bolus. EKG changes were noted and rapid response was activated, dialysis stopped, and blood returned to the patient. Around 6:30 AM the patient was noted to be apneic without a pulse and code blue was called. Patient was noted to have PEA arrest. CPR was done for a few minutes before ROSC was achieved. She was transferred to 60M and patient was intubated due to failure to protect airway.  She continued to be tenuous and minimally responsive, finally she was extubated on 12/03/2023 transferred to my care on 12/09/2023, early in the morning she was fairly stable however within a few hours she had an episode of  emesis witnessed by her husband subsequently developed A-fib RVR and respiratory distress, cardiology PCCM again called bedside and she was transferred to ICU, she stayed there for another 5 days stabilized however still dependent on NG tube feeds, was seen by palliative care.  She was transferred to my service on 12/16/2023 again.  Overall condition report is extremely tenuous, still very high risk for aspiration, still overall extremely weak and deconditioned, family for now wants to pursue all modalities of treatment.  She is currently on NG tube, needs 5-day vacation from DAPT to get a peg tube.   Significant Hospital Events & Prcedures:    1/23 admit to [facility] for acute infarct + covid/rsv pneumonia 1/30 admit to ICU after patient had PEA arrest  2/1 Failed SBT due to apnea. ABG with respiratory alkalosis. RR decreased 2/2 Tolerating SBT. Remains encephalopathic 2/5 Continues to tolerate SBT during day, but remains encephalopathic/unable to protect airway if extubated  2/9 extubated, off precedex 2/12 still lethargic, ABG 2/11 unremarkable, limit centrally acting meds 2/15 transferred to my care, still somnolent on NG tube feeds, aspirated her vomitus, again in respiratory distress, considered back to ICU 12/14/2023 transferred by from ICU, transferred to my service on 12/16/2023 12/19/2023.  Modified barium swallow.  Started on dysphagia 1 diet with honey thick liquids 12/23/2023.  NG tube discontinued 12/24/2023.  CT chest.  Suspicious for worsening pneumonia 12/25/2023, will reinsert core track/NG tube n.p.o. by mouth 12/26/2023 CT C-spine pending.   Unfortunately overall patient continues to appear quite weak and frail, sleepy NG tube feeding dependent, very weak cough reflex, very high risk for recurrent aspiration of oral secretions and regurgitated food material, overall still bedbound, overall prognosis appears poor explained to patient's husband clearly bedside on 12/17/2023.  Called and  discussed with son on 12/18/2023 as well.    Subjective:   Patient in bed, appears comfortable, denies any headache, no fever, no chest pain or pressure, no shortness of breath , cough, no abdominal pain.   Assessment  & Plan :    PEA arrest: due to hypotension during HD, not primarily cardiac in nature, intubated for airway protection and stabilized in ICU, seen by cardiology initially, now stable from cardiac arrest standpoint.  Acute encephalopathy: secondary to post-arrest, stroke, metabolic derangements, and drug induced. Concern for cefepime toxicity given her hx of ESRD, med was discontinued 2/4. MRI brain did show a new and acute central left cerebellar infarct.  Appreciate neurology recs.  Stroke not felt to be significant enough to explain altered mental status, Seroquel and amitriptyline have been stopped, mentation gradually improving appears to have poor baseline.  Aspiration on 12/09/2023 followed by acute hypoxic respiratory failure with respiratory distress and A-fib RVR.  Treated in ICU, now off of antibiotics, is on NG tube feeds with baseline dysphagia, still high risk for aspiration of oral contents, she was due for PEG tube placement however family decided to hold off on PEG tube as they did not want patient to be off of antiplatelets for 5 days which increases her risk of stroke.    They wanted speech therapy to evaluate the patient for oral diet, speech therapy saw the patient and she was placed on dysphagia 1 diet with nectar thick liquids on 12/20/2023 with the full understanding that she remains very high risk for aspiration, patient tolerated the diet fairly well for the first few days and NG tube was removed on 12/23/2023 unfortunately her white count started to creep up on 12/24/2023, chest x-ray was inconclusive CT scan was done which showed suspicion for worsening pneumonia, long discussion with patient's son and husband again on 12/24/2023 explaining to them that patient's  prognosis is poor as she is aspirating her oral secretions, regurgitated fluid and oral diet.  Note she had aspirated her NG tube feeds initially on  12/09/2023.  They wanted a trial of antibiotics again, Unasyn was started on 12/24/2023, she was made n.p.o., per the request new core track tube will be placed on 12/25/2023 unfortunately white count continues to creep up, she has had loose stools since the very beginning of this hospitalization due to tube feeds, clinically imperative to rule out C. difficile as well, she has been colonized with C. difficile in the past going back in her chart about 1 year ago.  ID and PCCM will be consulted as well.  Prognosis again remains very poor.    Acute to early subacute right PICA infarct on 1/24, resolved New acute central left cerebellar infarct with dysphagia, previous residual left-sided hemiparesis  - Appreciate neuro follow up , on DAPT with aspirin and brilinta along with statin, PEG tube held back per family request, currently fortunately has aspirated oral diet again on 12/24/2023.  Made her n.p.o., new core track on 12/27/2023.     Pneumonia due to COVID and RSV - Productive Cough, subsequently aspiration pneumonia.  Has had multiple courses of antibiotics.  Suspicion for aspiration pneumonia again on 12/25/2023.  Kindly see discussion above.  Abnormal C6 signal MRI showed a nonspecific bone marrow signal of c6 vertebral body - unclear if related to a malignancy vs discitis/osteomyelitis. Will hold off on C spine imaging, as family does not want to pursue this workup and priority is mental status/neuro recovery.    Since her white count is worsening will repeat CT C-spine, currently not stable for MRI.  Leukocytosis Sinusitis - MRI brain noted progressed mastoid effusions, she has finished her treatment with Augmentin   Paroxysmal atrial fibrillation with question of possible underlying sick sinus syndrome.  Seen by cardiology.  Currently on oral  amiodarone.  Long-term anticoagulation not recommended by cardiology this admission.  Needs 30-day event monitor upon discharge.  Hypertension  - Currently on hydralazine via tube.   Seizure disorder  - Continue home keppra 500 mg BID seen by neurology this admission.   ESRD on HD MWF - Dialysis per nephro - Discontinued renvela due to low phos levels   Anemia of chronic disease - 1 unit blood on 2/10, Hgb stable - trend CBC - Aranesp every Friday    Unstageable sacral decubitus ulcer.  Kindly see picture placed in the chart on multiple dates going back 12/02/23, wound care team following, input from general surgery on 12/25/2023, no infection noted.  DM. type II - Continue long-acting insulin along with ISS, dose adjusted due to change in diet on 12/22/2023  CBG (last 3)  Recent Labs    12/25/23 0010 12/25/23 0420 12/25/23 0905  GLUCAP 163* 135* 115*         Condition - Extremely Guarded  Family Communication  :    Updated son [phone]  in detail on 12/18/2023, wants to hold off on PEG tube till 2/29/2025, called 12/20/2023 at 8:20 AM.  No response.  Son updated 12/24/2023 in detail  Husband x 2 on 12/09/23, husband bedside 12/17/2023, called [phone] and left message on 12/20/2023 at 8:24 AM, called [phone] - updated husband updated bedside in detail on, 12/23/2023, 12/24/2023, over the phone on 12/25/2023   Code Status :  Full  Consults  :  PCCM, Cards, Pall Care, Renal, ID, IR, general surgery  PUD Prophylaxis : PPI   Procedures  :            Disposition Plan  :    Status is: Inpatient  DVT Prophylaxis  :    SCDs Start: 11/23/23 0716 heparin injection 5,000 Units Start: 11/17/23 0930     Lab Results  Component Value Date   PLT 577 (H) 12/25/2023    Diet :  Diet Order             Diet NPO time specified Except for: Sips with Meds  Diet effective now                    Inpatient Medications  Scheduled Meds:  arformoterol  15 mcg  Nebulization BID   aspirin  81 mg Oral Daily   budesonide (PULMICORT) nebulizer solution  0.5 mg Nebulization BID   Chlorhexidine Gluconate Cloth  6 each Topical Q0600   Chlorhexidine Gluconate Cloth  6 each Topical Q0600   clopidogrel  75 mg Oral Daily   darbepoetin (ARANESP) injection - DIALYSIS  200 mcg Subcutaneous Q Mon-1800   ferrous sulfate  300 mg Oral Daily   fiber supplement (BANATROL TF)  60 mL Oral BID   heparin injection (subcutaneous)  5,000 Units Subcutaneous Q8H   hydrALAZINE  50 mg Oral Q8H   insulin aspart  0-9 Units Subcutaneous Q6H   leptospermum manuka honey  1 Application Topical Daily   levETIRAcetam  500 mg Oral BID   melatonin  3 mg Oral QHS   multivitamin  1 tablet Oral QHS   nutrition supplement (JUVEN)  1 packet Per Tube BID BM   mouth rinse  15 mL Mouth Rinse Q4H   rosuvastatin  10 mg Oral Daily   sodium chloride flush  3 mL Intravenous Q12H   Continuous Infusions:  ampicillin-sulbactam (UNASYN) IV 3 g (12/24/23 1954)   dextrose 50 mL/hr at 12/25/23 0533   feeding supplement (NEPRO CARB STEADY) Stopped (12/22/23 0700)   PRN Meds:.acetaminophen **OR** acetaminophen, docusate, ipratropium-albuterol, labetalol, ondansetron (ZOFRAN) IV, mouth rinse, polyethylene glycol, Racepinephrine HCl, white petrolatum, witch hazel-glycerin    Objective:   Vitals:   12/25/23 0400 12/25/23 0500 12/25/23 0800 12/25/23 0903  BP: (!) 147/51  (!) 129/46 (!) 135/48  Pulse: 81  82 79  Resp: 19  (!) 26 20  Temp: 98.9 F (37.2 C)  98.7 F (37.1 C) 99.1 F (37.3 C)  TempSrc: Oral  Oral Oral  SpO2: 100%  100% 96%  Weight:  67.9 kg    Height:        Wt Readings from Last 3 Encounters:  12/25/23 67.9 kg  11/01/23 79.5 kg  08/24/23 83 kg     Intake/Output Summary (Last 24 hours) at 12/25/2023 1111 Last data filed at 12/25/2023 0500 Gross per 24 hour  Intake 787.39 ml  Output --  Net 787.39 ml       Physical Exam  Awake and alert this morning, following all  commands,  R BKA [place].AT,PERRAL Supple Neck, No JVD,   Symmetrical Chest wall movement, Good air movement bilaterally, bilateral breath sounds RRR,No Gallops,Rubs or new Murmurs,  +ve B.Sounds, Abd Soft, No tenderness,   No Cyanosis, Clubbing or edema     RN pressure injury documentation: Pressure Injury 12/02/23 Sacrum Medial Stage 2 -  Partial thickness loss of dermis presenting as a shallow open injury with a red, pink wound bed without slough. (Active)  12/02/23 1402  Location: Sacrum  Location Orientation: Medial  Staging: Stage 2 -  Partial thickness loss of dermis presenting as a shallow open injury with a red, pink wound bed without slough.  Wound Description (Comments):  Present on Admission: No  Dressing Type Foam - Lift dressing to assess site every shift 12/25/23 0730      Data Review:    Recent Labs  Lab 12/21/23 0455 12/22/23 0552 12/23/23 0556 12/24/23 0550 12/25/23 0526  WBC 21.0* 19.2* 17.0* 23.8* 29.0*  HGB 8.2* 7.6* 7.9* 7.4* 7.4*  HCT 24.1* 22.2* 23.5* 22.1* 21.9*  PLT 479* 491* 518* 552* 577*  MCV 93.4 92.5 92.9 92.9 93.6  MCH 31.8 31.7 31.2 31.1 31.6  MCHC 34.0 34.2 33.6 33.5 33.8  RDW 17.8* 17.1* 17.2* 16.9* 16.7*  LYMPHSABS 3.8 3.4 2.9 3.7 3.9  MONOABS 2.9* 2.1* 2.1* 2.6* 2.6*  EOSABS 0.2 0.3 0.2 0.1 0.2  BASOSABS 0.1 0.1 0.1 0.1 0.1    Recent Labs  Lab 12/18/23 1114 12/19/23 0439 12/20/23 1025 12/21/23 0925 12/22/23 0552 12/23/23 0556 12/24/23 0550 12/25/23 0526  NA  --  133* 132*  --  133*  --  136  --   K  --  4.9 4.9  --  4.5  --  4.4  --   CL  --  97* 95*  --  94*  --  92*  --   CO2  --  27 26  --  23  --  25  --   ANIONGAP  --  9 11  --  16*  --  19*  --   GLUCOSE  --  158* 137*  --  103*  --  202*  --   BUN  --  61* 98*  --  94*  --  81*  --   CREATININE  --  3.72* 5.35*  --  5.14*  --  4.59*  --   ALBUMIN  --  2.4*  --   --  2.3*  --  2.3*  --   CRP  --   --  4.2* 7.8* 7.5* 8.8* 12.8* 14.2*  PROCALCITON   < >  --  2.04  2.26 2.28 1.65 1.82 1.81  PHOS  --  3.6  --   --  3.5  --  4.0  --   CALCIUM  --  9.2 9.0  --  9.3  --  10.1  --    < > = values in this interval not displayed.      Recent Labs  Lab 12/18/23 1114 12/19/23 0439 12/20/23 1025 12/21/23 0925 12/22/23 0552 12/23/23 0556 12/24/23 0550 12/25/23 0526  CRP  --   --  4.2* 7.8* 7.5* 8.8* 12.8* 14.2*  PROCALCITON   < >  --  2.04 2.26 2.28 1.65 1.82 1.81  CALCIUM  --  9.2 9.0  --  9.3  --  10.1  --    < > = values in this interval not displayed.    --------------------------------------------------------------------------------------------------------------- Lab Results  Component Value Date   CHOL 90 11/17/2023   HDL 38 (L) 11/17/2023   LDLCALC 37 11/17/2023   TRIG 86 12/02/2023   CHOLHDL 2.4 11/17/2023    Lab Results  Component Value Date   HGBA1C 5.8 (H) 10/26/2023    Micro Results No results found for this or any previous visit (from the past 240 hours).  Radiology Report CT CHEST WO CONTRAST Result Date: 12/24/2023 CLINICAL DATA:  Pneumonia, complication suspected, xray done EXAM: CT CHEST WITHOUT CONTRAST TECHNIQUE: Multidetector CT imaging of the chest was performed following the standard protocol without IV contrast. RADIATION DOSE REDUCTION: This exam was performed according to the departmental dose-optimization program which includes automated  exposure control, adjustment of the mA and/or kV according to patient size and/or use of iterative reconstruction technique. COMPARISON:  Chest radiographs 12/24/2023 and 12/21/2023. Chest CT 11/16/2023 and 10/24/2023 FINDINGS: Cardiovascular: Atherosclerosis of the aorta, great vessels and coronary arteries. Right IJ hemodialysis catheter extends well into the right atrium near the tricuspid valve. Stable mild cardiac enlargement without significant pericardial effusion. Mediastinum/Nodes: Grossly stable right hilar enlargement, suboptimally evaluated on noncontrast imaging. No enlarged  mediastinal or axillary lymph nodes are identified. The esophagus is fluid-filled and mildly distended. Lungs/Pleura: No significant residual pleural effusion or pneumothorax. Similar dependent left lower lobe airspace disease, likely atelectasis. Mildly increased band like opacity in the right middle and lower lobes, also likely atelectasis. Upper abdomen: The visualized upper abdomen appears stable, without acute findings. There are diffuse vascular calcifications. Mild renal cortical thinning bilaterally. Musculoskeletal/Chest wall: Multiple left-sided rib fractures are demonstrated, not seen on recent chest CT. These have ill-defined margins and mild callus, likely subacute. Fractures of the left 4th and 5th ribs are mildly displaced (image 143/7). There are nondisplaced fractures of the left 2nd, 3rd and 6th ribs. There is a stable chronic moderate convex right thoracolumbar scoliosis. Severe disc space narrowing, irregularity and vertebral body partial collapse at C6-7. These findings are similar to the previous chest CT but new from CT of the neck 06/22/2022. This appearance can be seen with discitis/osteomyelitis or hemodialysis associated spondyloarthropathy. IMPRESSION: 1. Multiple left-sided rib fractures, not seen on recent chest CT, likely subacute. 2. No significant residual pleural effusion or pneumothorax. Similar dependent left lower lobe airspace disease, likely atelectasis. Mildly increased band like opacity in the right middle and lower lobes, also likely atelectasis. In this clinical context, findings could be due to pneumonia. 3. Severe disc space narrowing, irregularity and vertebral body partial collapse at C6-7 which could reflect discitis/osteomyelitis or hemodialysis associated spondyloarthropathy. Consider further evaluation with CT or MRI (preferred) of the cervical spine. 4. Fluid-filled and mildly distended esophagus, likely due to reflux. 5.  Aortic Atherosclerosis (ICD10-I70.0).  Electronically Signed   By: Carey Bullocks M.D.   On: 12/24/2023 13:37   DG Chest Port 1 View Result Date: 12/24/2023 CLINICAL DATA:  Shortness of breath EXAM: PORTABLE CHEST 1 VIEW COMPARISON:  12/21/2023 FINDINGS: Reverse apical lordotic positioning. Dialysis catheter tip at low right atrium. Removal of feeding tube. Moderate cardiomegaly. Pleuroparenchymal opacification in the inferior left hemithorax is similar. Subsegmental atelectasis at the right lung base is unchanged. No pneumothorax. IMPRESSION: 1. Similar pleuroparenchymal opacification in the inferior left hemithorax. Likely trace pleural fluid and adjacent airspace disease. 2.  Cardiomegaly without congestive failure. Electronically Signed   By: Jeronimo Greaves M.D.   On: 12/24/2023 10:20      Signature  -   Susa Raring M.D on 12/25/2023 at 11:11 AM   -  To page go to [email or web address]

## 2023-12-25 NOTE — Progress Notes (Addendum)
 Carolyn Russo Progress Note   Subjective:    Seen and examined at bedside. Alert, but nonverbal. Waves to providers in the room. For dialysis today.   Objective Vitals:   12/25/23 0500 12/25/23 0800 12/25/23 0903 12/25/23 1228  BP:  (!) 129/46 (!) 135/48 (!) 156/50  Pulse:  82 79   Resp:  (!) 26 20 20   Temp:  98.7 F (37.1 C) 99.1 F (37.3 C) 97.8 F (36.6 C)  TempSrc:  Oral Oral Axillary  SpO2:  100% 96%   Weight: 67.9 kg     Height:       Physical Exam   General elderly female chronically ill appearing; in bed in no acute distress Lungs clear, normal wob Heart S1S2 no rub Abdomen soft nontender nondistended Extremities no edema appreciated; right BKA  Psych no anxiety or agitation Neuro Does not answer questions or follow commands Access RIJ tunn catheter; right AVF with bruit and thrill     Dialysis Orders: MWF NW  4h   B350    75.5kg   2/2 bath   R fem TDC  Heparin 2600 + 1400 midrun - last HD 1/22, getting to dry wt - venofer 100 qhd thru 1/31 - mircera 150 q2, last 1/17, due 1/31  Renal-related home meds: - rocaltrol 0.5 mwf - sensipar 30 every day - hydralazine 100 tid - losartan 50 bid - renvela 800mg  ac tid - others: brilinta, crestor, keppra, ritalin, neurontin, asa, PPI, dulera, insulin, albuterol, elavil  CXR 2/18, 2/22 - small R effusion, no edema/ congestion   Assessment/Plan: ESRD - HD MWF. Continue on schedule.  Acute hypoxic respiratory failure - multifactorial with +COVID/ +RSV pna/ volume^: S/P course of abx + and we are optimizing volume status with HD.  Antibiotics per primary team discretion   CVA: R then L cerebellar infarcts d/t small vessel disease. Per neurology.  On aspirin and plavix for now  Encephalopathy - multifactorial. Post-arrest encephalopathy.  Aspiration pneumonitis - Speech is following.  Optimize volume with HD - continued oxygen requirement.  Nutrition per primary team  HTN - controlled.  Follow for need to  further reduce hydralazine Anemia of esrd: Aranesp 200 mcg every Monday.  Tsat 26% and ferritin 1638.  Getting po iron.  Transfuse as needed per primary  Secondary hyperparathyroidism -  phos ok on current regimen. Nepro is ordered but the nasal tube is out - not on feeds H/o seizure d/o - on Keppra PEA arrest - noted recent complicated history  GOC - poor prognosis in general. Pt was having significant difficulties w/ outpatient dialysis prior to this admission, including AMS and hypotension on dialysis.   Disposition - per primary team   Recent Labs  Lab 12/24/23 0550 12/25/23 0526 12/25/23 1134  HGB 7.4* 7.4*  --   ALBUMIN 2.3*  --  2.4*  CALCIUM 10.1  --  9.3  PHOS 4.0  --  4.3  CREATININE 4.59*  --  6.37*  K 4.4  --  4.5    Inpatient medications:  arformoterol  15 mcg Nebulization BID   aspirin  81 mg Oral Daily   budesonide (PULMICORT) nebulizer solution  0.5 mg Nebulization BID   Chlorhexidine Gluconate Cloth  6 each Topical Q0600   Chlorhexidine Gluconate Cloth  6 each Topical Q0600   clopidogrel  75 mg Oral Daily   darbepoetin (ARANESP) injection - DIALYSIS  200 mcg Subcutaneous Q Mon-1800   feeding supplement (PROSource TF20)  60 mL Per Tube Daily  ferrous sulfate  300 mg Oral Daily   fiber supplement (BANATROL TF)  60 mL Oral BID   heparin injection (subcutaneous)  5,000 Units Subcutaneous Q8H   hydrALAZINE  50 mg Oral Q8H   insulin aspart  0-9 Units Subcutaneous Q6H   leptospermum manuka honey  1 Application Topical Daily   levETIRAcetam  500 mg Oral BID   melatonin  3 mg Oral QHS   multivitamin  1 tablet Oral QHS   nutrition supplement (JUVEN)  1 packet Per Tube BID BM   mouth rinse  15 mL Mouth Rinse Q4H   rosuvastatin  10 mg Oral Daily   sodium chloride flush  3 mL Intravenous Q12H    ampicillin-sulbactam (UNASYN) IV 3 g (12/24/23 1954)   dextrose 50 mL/hr at 12/25/23 0533   feeding supplement (NEPRO CARB STEADY) Stopped (12/22/23 0700)   acetaminophen  **OR** acetaminophen, docusate, ipratropium-albuterol, labetalol, ondansetron (ZOFRAN) IV, mouth rinse, polyethylene glycol, Racepinephrine HCl, white petrolatum, witch hazel-glycerin   Tomasa Blase PA-C [facility] 12/25/2023,1:17 PM

## 2023-12-25 NOTE — Consult Note (Signed)
 [facility]    Date of Admission:  11/16/2023     Reason for Consult: sepsis    Referring Provider: Susa Raring     Lines:  Hd catheter right chest  Abx: 3/02-c amp-sulb  2/12-21 amox-clav      1/31-2/4 cefepime 1/31-2/03 vanc  Assessment: 73 y.o. female hx cva residual left sided weakness, right bka, esrd on iHD, chf, hx cdiff colitis, admitted 11/16/23 with rsv/covid infection and iamging mri new right PICA infarct, course further complicated by PEA arrest and multiple aspiration episodes. She is essentially bedbound, completely dependent on care, high risk dysphagia and has had multiple aspiration events, and had since developed bedsore this admission. Concerning is rising wbc and cdiff history  She had pea and s/p resuscitation with 3/2 chest ct still with multiple left sided ribs fracture very high risk for contributing to pneumonia   Very poor prognosis. Seems to take several steps backward with each one step forward  I agree that continued abx use is high risk for patient with hx cdiff and likely colonizer/development of mdr organisms and also kidney/liver toxicity  I agree with palliative care/hospice probably if family agrees would be humane    Plan: As short a course as possible 3-5 days for aspiration events; these could be pneumonitis rather and reasonable to monitor for 2-3 days initially not pulling abx trigger unless progressive/persistent respiratory distress/sepsis Cdiff testing reasonable as primary team had sent Nothing to add from id standpoint Will sign off Agree with ongoing goals of care discussion Standard isolation precaution Discussed with primary team      ------------------------------------------------ Principal Problem:   Acute encephalopathy Active Problems:   ESRD on dialysis (HCC)   Troponin I above reference range   CAP (community acquired pneumonia)   Pneumonia   Cardiac arrest (HCC)    Atrial fibrillation with rapid ventricular response (HCC)   Pressure injury of skin    HPI: Carolyn Russo is a 73 y.o. female hx cva residual left sided weakness, esrd on iHD, chf, hx cdiff colitis, admitted 11/16/23 with rsv/covid infection and iamging mri new right PICA infarct, course further complicated by PEA arrest and multiple aspiration episodes     Patient debilitated/nonverbal/confused, hx via signout with primary team and chart review  Patient has been intubated and extubated more than once. Currently on medicine ward. Last extubated 12/03/23. Also has been in and out of icu for tenuous respiratory status, retransferred to the floor 12/16/23  Currently has ngt  Family appears to wants full care  Tenuous status and family had refused palliative care multiple times  Per previous chart:    Significant Hospital Events & Prcedures:    1/23 admit to [facility] for acute infarct + covid/rsv pneumonia 1/30 admit to ICU after patient had PEA arrest  2/1 Failed SBT due to apnea. ABG with respiratory alkalosis. RR decreased 2/2 Tolerating SBT. Remains encephalopathic 2/5 Continues to tolerate SBT during day, but remains encephalopathic/unable to protect airway if extubated  2/9 extubated, off precedex 2/12 still lethargic, ABG 2/11 unremarkable, limit centrally acting meds 2/15 transferred to my care, still somnolent on NG tube feeds, aspirated her vomitus, again in respiratory distress, considered back to ICU 12/14/2023 transferred by from ICU, transferred to my service on 12/16/2023 12/19/2023.  Modified barium swallow.  Started on dysphagia 1 diet with honey thick liquids 12/23/2023.  NG tube discontinued 12/24/2023.  CT chest.  Suspicious for worsening pneumonia 12/25/2023,  will reinsert core track/NG tube n.p.o. by mouth 12/26/2023 CT C-spine pending.   I was called due to leukocytosis and concern for active aspiration pna She also has some diarrhea and primary team is testing for  cdiff  Patient is debilitated, left sided flexion contracture, minimal interaction and has decubitus ulcer  Surgery had evaluated an no indication for surgery -- recs ongoing wound care  Last fever 3/1 Wbc rising 17 on 2/28 to 29 on 3/02, again with concern of diarrhea/cdiff      Family History  Problem Relation Age of Onset   Psoriasis Mother    Melanoma Mother        abdominal   Alzheimer's disease Father    Diabetes Maternal Aunt    Colon cancer Cousin    Diabetes Cousin    Colon polyps Neg Hx    Esophageal cancer Neg Hx    Cancer - Colon Neg Hx    Liver disease Neg Hx    BRCA 1/2 Neg Hx    Breast cancer Neg Hx     Social History   Tobacco Use   Smoking status: Never   Smokeless tobacco: Never  Vaping Use   Vaping status: Never Used  Substance Use Topics   Alcohol use: No   Drug use: No    Allergies  Allergen Reactions   Almond Oil Anaphylaxis   Food Anaphylaxis    Peanuts - anaphylaxis     Gadolinium Derivatives Other (See Comments)    Gadolinium-Containing Contrast Media    Januvia [Sitagliptin] Other (See Comments)   Pork-Derived Products Other (See Comments)    Does not eat pork  OKAY FOR HEPARIN    Review of Systems: ROS All Other ROS was negative, except mentioned above   Past Medical History:  Diagnosis Date   Acute GI bleeding    Allergy    Anemia    Anterior chest wall pain    Appendicitis 1965   Asthma    Body mass index 37.0-37.9, adult    Breast pain    Cataract    both eyes   CHF (congestive heart failure) (HCC)    Cognitive change 04/20/2021   r/t cva 03/2821   Complication of anesthesia    Memory loss after general   Dehydration 2014   Deviated septum 1971   Diabetes mellitus    Dysphagia due to old stroke    easy to get strangled when eating   Dyspnea 2014   ESRD on hemodialysis (HCC)    MWF at [facility]   Extrinsic asthma    WITH ASTHMA ATTACK   Fibroid 1980   GERD (gastroesophageal reflux disease)    Heart  murmur    History of migraine    History of seizure    with stroke   Hx gestational diabetes    Hyperlipidemia    Hypertension 2014   Inguinal hernia 1959   Malaise and fatigue 2014   Non-IgE mediated allergic asthma 2014   Obesity    Pelvic pain    Pregnancy, high-risk 1985   Stroke (HCC) 04/20/2021   (CVA) of right basal ganglia   Tonsillitis 1968   Uterine fibroid 1980   Visual field defect    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       Scheduled Meds:  arformoterol  15 mcg Nebulization BID   aspirin  81 mg Oral Daily   budesonide (PULMICORT) nebulizer solution  0.5 mg Nebulization BID   Chlorhexidine Gluconate Cloth  6 each  Topical Q0600   Chlorhexidine Gluconate Cloth  6 each Topical Q0600   clopidogrel  75 mg Oral Daily   darbepoetin (ARANESP) injection - DIALYSIS  200 mcg Subcutaneous Q Mon-1800   ferrous sulfate  300 mg Oral Daily   fiber supplement (BANATROL TF)  60 mL Oral BID   heparin injection (subcutaneous)  5,000 Units Subcutaneous Q8H   hydrALAZINE  50 mg Oral Q8H   insulin aspart  0-9 Units Subcutaneous Q6H   leptospermum manuka honey  1 Application Topical Daily   levETIRAcetam  500 mg Oral BID   melatonin  3 mg Oral QHS   multivitamin  1 tablet Oral QHS   nutrition supplement (JUVEN)  1 packet Per Tube BID BM   mouth rinse  15 mL Mouth Rinse Q4H   rosuvastatin  10 mg Oral Daily   sodium chloride flush  3 mL Intravenous Q12H   Continuous Infusions:  ampicillin-sulbactam (UNASYN) IV 3 g (12/24/23 1954)   dextrose 50 mL/hr at 12/25/23 0533   feeding supplement (NEPRO CARB STEADY) Stopped (12/22/23 0700)   PRN Meds:.acetaminophen **OR** acetaminophen, docusate, ipratropium-albuterol, labetalol, ondansetron (ZOFRAN) IV, mouth rinse, polyethylene glycol, Racepinephrine HCl, white petrolatum, witch hazel-glycerin   OBJECTIVE: Blood pressure (!) 135/48, pulse 79, temperature 99.1 F (37.3 C), temperature source Oral, resp. rate 20, height 5\' 3"  (1.6 m), weight 67.9  kg, SpO2 96%.  Physical Exam General/constitutional: sleeping vs altered; eyes closed; no interaction HEENT: Normocephalic Neck supple CV: rrr no mrg Lungs: clear to auscultation, normal respiratory effort Abd: Soft, Nontender Ext: no edema Skin: buttock decub picture reviewed Neuro: left sided flexion contracture more so in left wrist MSK: right bka   Central line presence: right chest hd cath site no purulence    Lab Results Lab Results  Component Value Date   WBC 29.0 (H) 12/25/2023   HGB 7.4 (L) 12/25/2023   HCT 21.9 (L) 12/25/2023   MCV 93.6 12/25/2023   PLT 577 (H) 12/25/2023    Lab Results  Component Value Date   CREATININE 4.59 (H) 12/24/2023   BUN 81 (H) 12/24/2023   NA 136 12/24/2023   K 4.4 12/24/2023   CL 92 (L) 12/24/2023   CO2 25 12/24/2023    Lab Results  Component Value Date   ALT 69 (H) 11/24/2023   AST 40 11/24/2023   ALKPHOS 80 11/24/2023   BILITOT 1.5 (H) 11/24/2023      Microbiology: No results found for this or any previous visit (from the past 240 hours).   Serology:    Imaging: If present, new imagings (plain films, ct scans, and mri) have been personally visualized and interpreted; radiology reports have been reviewed. Decision making incorporated into the Impression / Recommendations.  3/2 chest ct 1. Multiple left-sided rib fractures, not seen on recent chest CT, likely subacute. 2. No significant residual pleural effusion or pneumothorax. Similar dependent left lower lobe airspace disease, likely atelectasis. Mildly increased band like opacity in the right middle and lower lobes, also likely atelectasis. In this clinical context, findings could be due to pneumonia. 3. Severe disc space narrowing, irregularity and vertebral body partial collapse at C6-7 which could reflect discitis/osteomyelitis or hemodialysis associated spondyloarthropathy. Consider further evaluation with CT or MRI (preferred) of the cervical spine. 4.  Fluid-filled and mildly distended esophagus, likely due to reflux. 5.  Aortic Atherosclerosis  Raymondo Band, MD [facility] [phone] pager    12/25/2023, 10:58 AM

## 2023-12-25 NOTE — Progress Notes (Signed)
 Speech Language Pathology Treatment: Dysphagia  Patient Details Name: Carolyn Russo MRN: [id number] DOB: 1951/05/07 Today's Date: 12/25/2023 Time: 7846-9629 SLP Time Calculation (min) (ACUTE ONLY): 11 min  Assessment / Plan / Recommendation Clinical Impression  SLP did several bites applesauce and 1/2 tsp honey thick liquids prior to learning she was made NPO yesterday after CT scan. Repeat CT scan showed mildly increased band like opacity in the right middle and lower lobes, also likely atelectasis. In this clinical context, findings could be due to pneumonia;  Fluid-filled and mildly distended esophagus, likely due to reflux. Pt has had pna- spoke with Dr. Thedore Mins who stated her white count it elevated.  Pt with mild oral transit delays and suspect pharyngeal delays. She did cough with first bite applesauce and delayed cough during po's. Pt is at a chronic aspiration risk even after MBS. Family has said they do not want PEG. Pt receiving Cortrak today and per MD, continue NPO. Will need to discuss goal with pt's husband/son re: goal for her nutrition, eat/drink with known risks? The Cortrak is not a long term solution. Will attempt to reach family tomorrow to discuss and have asked MD to continue with these discussions as well.  Pt briefly used EMST device (nephrologist entered room) with trainer set at lowest 5 cm H20 and pt able to use correctly several times before she began to bite the mouthpiece. Pt is unable to perform traditional exercises due to her current cognitive status. ST will continue intervention.    HPI HPI: Pt is a 73 y.o. female with who presented from SNF on 11/16/23 for chest pain, weakness and confusion. CT head revealed infarct in inferior right cerebellum which was new since 06/12/22. MRI brain with acute right inferomedial cerebellar ischemic infarction in the right PICA territory. Pt also diagnosed with COVID/RSV. On 2/15  transferred back to ICU for atrial fibrillation with rapid  ventricular response that developed after a gross aspiration event from vomiting. MBS 1/28 DIGEST swallow severity rating of 1, mild. Recommended dys2/thin liquids. Secondary swallows needed to clear pharyngeal residue. Experienced cardiac arrest on 1/30 and was intubated until 2/9 (ten days) and remained NPO thereafter with cortrak. PMH: CVA in 2022 and 2023, right BKA, HTN, DM-2, HLD, ESRD on HD MWF, asthma, dysphagia, CHF, heart murmur, seizure. Repeat instrumental assessment with MBS.      SLP Plan  Continue with current plan of care      Recommendations for follow up therapy are one component of a multi-disciplinary discharge planning process, led by the attending physician.  Recommendations may be updated based on patient status, additional functional criteria and insurance authorization.    Recommendations  Diet recommendations: Other(comment) (pt made NPO 3/2 after CT scan) Medication Administration: Via alternative means                  Oral care BID   Frequent or constant Supervision/Assistance Dysphagia, unspecified (R13.10);Cognitive communication deficit (B28.413)     Continue with current plan of care     Royce Macadamia  12/25/2023, 10:22 AM

## 2023-12-25 NOTE — Consult Note (Signed)
 This patient is being followed by [facility] team for  HAPI to sacrum, last seen in person 12/19/2023.  Consulted for worsening sacral wound.  Will change wound care orders to Medihoney to address necrotic tissue and patient will be seen in person by [facility] team week of 12/25/2023.   WOC Nurse wound follow up Wound type: Unstageable Pressure Injury sacrum/buttocks  Measurement: see prior note 12/19/2023 and nursing flowsheet  Wound bed: 90% yellow necrotic 10% pink  Drainage (amount, consistency, odor) see nursing flowsheet  Periwound: maroon discoloration  Dressing procedure/placement/frequency: Cleanse buttocks/sacral wound with Vashe wound cleanser Hart [place] [phone]), apply Medihoney to wound bed daily, cover with dry gauze and ABD pad or silicone foam whichever is preferred.   Per last WOC note patient is on a low airloss mattress for pressure redistribution and moisture management.   WOC team will follow week of 12/25/2023 for ongoing assessment and management of this area.   Secure chat sent to bedside nurse regarding above.   Thank you,    Priscella Mann MSN, RN-BC, [employer] [phone]

## 2023-12-26 ENCOUNTER — Inpatient Hospital Stay (HOSPITAL_COMMUNITY)

## 2023-12-26 DIAGNOSIS — Z7189 Other specified counseling: Secondary | ICD-10-CM | POA: Diagnosis not present

## 2023-12-26 DIAGNOSIS — J189 Pneumonia, unspecified organism: Secondary | ICD-10-CM | POA: Diagnosis not present

## 2023-12-26 DIAGNOSIS — Z515 Encounter for palliative care: Secondary | ICD-10-CM | POA: Diagnosis not present

## 2023-12-26 DIAGNOSIS — G9340 Encephalopathy, unspecified: Secondary | ICD-10-CM | POA: Diagnosis not present

## 2023-12-26 LAB — GLUCOSE, CAPILLARY
Glucose-Capillary: 180 mg/dL — ABNORMAL HIGH (ref 70–99)
Glucose-Capillary: 194 mg/dL — ABNORMAL HIGH (ref 70–99)
Glucose-Capillary: 175 mg/dL — ABNORMAL HIGH (ref 70–99)
Glucose-Capillary: 190 mg/dL — ABNORMAL HIGH (ref 70–99)
Glucose-Capillary: 138 mg/dL — ABNORMAL HIGH (ref 70–99)
Glucose-Capillary: 174 mg/dL — ABNORMAL HIGH (ref 70–99)

## 2023-12-26 LAB — CBC
HCT: 21.5 % — ABNORMAL LOW (ref 36.0–46.0)
Hemoglobin: 7.5 g/dL — ABNORMAL LOW (ref 12.0–15.0)
MCH: 31.3 pg (ref 26.0–34.0)
MCHC: 34.9 g/dL (ref 30.0–36.0)
MCV: 89.6 fL (ref 80.0–100.0)
Platelets: 387 10*3/uL (ref 150–400)
RBC: 2.4 MIL/uL — ABNORMAL LOW (ref 3.87–5.11)
RDW: 16.5 % — ABNORMAL HIGH (ref 11.5–15.5)
WBC: 25 10*3/uL — ABNORMAL HIGH (ref 4.0–10.5)
nRBC: 0 % (ref 0.0–0.2)

## 2023-12-26 LAB — C DIFFICILE (CDIFF) QUICK SCRN (NO PCR REFLEX)
C Diff antigen: POSITIVE — AB
C Diff interpretation: DETECTED
C Diff toxin: POSITIVE — AB

## 2023-12-26 LAB — CBC WITH DIFFERENTIAL/PLATELET
Abs Immature Granulocytes: 0.32 10*3/uL — ABNORMAL HIGH (ref 0.00–0.07)
Basophils Absolute: 0.1 10*3/uL (ref 0.0–0.1)
Basophils Relative: 0 %
Eosinophils Absolute: 0.1 10*3/uL (ref 0.0–0.5)
Eosinophils Relative: 0 %
HCT: 18.3 % — ABNORMAL LOW (ref 36.0–46.0)
Hemoglobin: 6.3 g/dL — CL (ref 12.0–15.0)
Immature Granulocytes: 1 %
Lymphocytes Relative: 10 %
Lymphs Abs: 3.3 10*3/uL (ref 0.7–4.0)
MCH: 31.7 pg (ref 26.0–34.0)
MCHC: 34.4 g/dL (ref 30.0–36.0)
MCV: 92 fL (ref 80.0–100.0)
Monocytes Absolute: 2.4 10*3/uL — ABNORMAL HIGH (ref 0.1–1.0)
Monocytes Relative: 8 %
Neutro Abs: 25.8 10*3/uL — ABNORMAL HIGH (ref 1.7–7.7)
Neutrophils Relative %: 81 %
Platelets: 492 10*3/uL — ABNORMAL HIGH (ref 150–400)
RBC: 1.99 MIL/uL — ABNORMAL LOW (ref 3.87–5.11)
RDW: 16.5 % — ABNORMAL HIGH (ref 11.5–15.5)
WBC: 32 10*3/uL — ABNORMAL HIGH (ref 4.0–10.5)
nRBC: 0 % (ref 0.0–0.2)

## 2023-12-26 LAB — PROCALCITONIN: Procalcitonin: 2.87 ng/mL

## 2023-12-26 LAB — PREPARE RBC (CROSSMATCH)

## 2023-12-26 LAB — C-REACTIVE PROTEIN: CRP: 18.3 mg/dL — ABNORMAL HIGH (ref <1.0)

## 2023-12-26 MED ORDER — ALTEPLASE 2 MG IJ SOLR: 2.0000 mg | Freq: Once | INTRAMUSCULAR | Status: DC | PRN

## 2023-12-26 MED ORDER — CEFTRIAXONE SODIUM 1 G IJ SOLR
1.0000 g | INTRAMUSCULAR | Status: AC
  Administered 2023-12-26 – 2023-12-27 (×2): 1 g via INTRAVENOUS
  Filled 2023-12-26 (×2): qty 10

## 2023-12-26 MED ORDER — PENTAFLUOROPROP-TETRAFLUOROETH EX AERO: 1.0000 | INHALATION_SPRAY | CUTANEOUS | Status: DC | PRN

## 2023-12-26 MED ORDER — VANCOMYCIN 50 MG/ML ORAL SOLUTION
500.0000 mg | Freq: Four times a day (QID) | ORAL | Status: DC
  Administered 2023-12-26 – 2023-12-27 (×4): 500 mg via ORAL
  Filled 2023-12-26 (×6): qty 10

## 2023-12-26 MED ORDER — ANTICOAGULANT SODIUM CITRATE 4% (200MG/5ML) IV SOLN: 5.0000 mL | Status: DC | PRN

## 2023-12-26 MED ORDER — BUDESONIDE 0.25 MG/2ML IN SUSP
RESPIRATORY_TRACT | Status: AC
  Filled 2023-12-26: qty 2

## 2023-12-26 MED ORDER — LIDOCAINE HCL (PF) 1 % IJ SOLN: 5.0000 mL | INTRAMUSCULAR | Status: DC | PRN

## 2023-12-26 MED ORDER — SODIUM CHLORIDE 0.9 % IV BOLUS
500.0000 mL | Freq: Once | INTRAVENOUS | Status: AC
  Administered 2023-12-26: 500 mL via INTRAVENOUS

## 2023-12-26 MED ORDER — HEPARIN SODIUM (PORCINE) 1000 UNIT/ML IJ SOLN
INTRAMUSCULAR | Status: AC
  Administered 2023-12-26: 3600 [IU]
  Filled 2023-12-26: qty 4

## 2023-12-26 MED ORDER — METRONIDAZOLE 500 MG/100ML IV SOLN
500.0000 mg | Freq: Two times a day (BID) | INTRAVENOUS | Status: AC
  Administered 2023-12-26 – 2023-12-27 (×4): 500 mg via INTRAVENOUS
  Filled 2023-12-26 (×4): qty 100

## 2023-12-26 MED ORDER — LIDOCAINE-PRILOCAINE 2.5-2.5 % EX CREA: 1.0000 | TOPICAL_CREAM | CUTANEOUS | Status: DC | PRN

## 2023-12-26 NOTE — Plan of Care (Signed)
  Problem: Metabolic: Goal: Ability to maintain appropriate glucose levels will improve Outcome: Progressing   Problem: Skin Integrity: Goal: Risk for impaired skin integrity will decrease Outcome: Progressing   Problem: Respiratory: Goal: Will maintain a patent airway Outcome: Progressing   Problem: Clinical Measurements: Goal: Respiratory complications will improve Outcome: Progressing

## 2023-12-26 NOTE — Progress Notes (Signed)
 [facility] Progress Note   Subjective:    Did not get dialysis yesterday. Roll over today -seen in [facility]. Getting started with dialysis. Appears comfortable this am.   Objective Vitals:   12/26/23 0843 12/26/23 0856 12/26/23 0903 12/26/23 0930  BP: (!) 129/50 (!) 134/48 (!) 124/48 (!) 113/52  Pulse: 81 80 80 85  Resp: (!) 0 (!) 31 (!) 28 (!) 23  Temp: 98.3 F (36.8 C)     TempSrc:      SpO2: 100% 100% 100% 100%  Weight: 65.8 kg  65.8 kg   Height:       Physical Exam   General elderly female chronically ill appearing; in bed in no acute distress Lungs clear, normal wob Heart S1S2 no rub Abdomen soft nontender nondistended Extremities no edema appreciated; right BKA  Psych no anxiety or agitation Neuro Does not answer questions or follow commands Access RIJ tunn catheter; right AVF with bruit and thrill     Dialysis Orders: MWF [facility]  4h   B350    75.5kg   2/2 bath   R fem TDC  Heparin 2600 + 1400 midrun - last HD 1/22, getting to dry wt - venofer 100 qhd thru 1/31 - mircera 150 q2, last 1/17, due 1/31  Renal-related home meds: - rocaltrol 0.5 mwf - sensipar 30 every day - hydralazine 100 tid - losartan 50 bid - renvela 800mg  ac tid - others: brilinta, crestor, keppra, ritalin, neurontin, asa, PPI, dulera, insulin, albuterol, elavil  CXR 2/18, 2/22 - small R effusion, no edema/ congestion   Assessment/Plan: ESRD - HD MWF. HD off schedule today.  Acute hypoxic respiratory failure - multifactorial with +COVID/ +RSV pna/volume: S/P course of abx + and we are optimizing volume status with HD.  Antibiotics per primary team discretion   CVA: R then L cerebellar infarcts d/t small vessel disease.On aspirin and plavix for now  Encephalopathy - multifactorial. Post-arrest encephalopathy.  Aspiration pneumonitis -   NGT in place. Nutrition per primary team  HTN - controlled.  Follow for need to further reduce hydralazine Anemia of esrd: Aranesp 200 mcg every  Monday.  Tsat 26% and ferritin 1638.  Getting po iron.  Transfuse as needed per primary  Secondary hyperparathyroidism -  phos ok on current regimen. C. Diff infection - on oral vancomycin  s/p PEA arrest - noted recent complicated history  Seizure disorder - on Keppra  DM - insulin per primary  GOC - poor prognosis in general. Pt was having significant difficulties w/ outpatient dialysis prior to this admission, including AMS and hypotension on dialysis.   Disposition - per primary team   Recent Labs  Lab 12/24/23 0550 12/25/23 0526 12/25/23 1134  HGB 7.4* 7.4*  --   ALBUMIN 2.3*  --  2.4*  CALCIUM 10.1  --  9.3  PHOS 4.0  --  4.3  CREATININE 4.59*  --  6.37*  K 4.4  --  4.5    Inpatient medications:  arformoterol  15 mcg Nebulization BID   aspirin  81 mg Oral Daily   budesonide (PULMICORT) nebulizer solution  0.5 mg Nebulization BID   Chlorhexidine Gluconate Cloth  6 each Topical Q0600   Chlorhexidine Gluconate Cloth  6 each Topical Q0600   clopidogrel  75 mg Oral Daily   darbepoetin (ARANESP) injection - DIALYSIS  200 mcg Subcutaneous Q Mon-1800   feeding supplement (PROSource TF20)  60 mL Per Tube Daily   ferrous sulfate  300 mg Oral Daily  fiber supplement (BANATROL TF)  60 mL Oral BID   heparin injection (subcutaneous)  5,000 Units Subcutaneous Q8H   hydrALAZINE  50 mg Oral Q8H   insulin aspart  0-9 Units Subcutaneous Q6H   leptospermum manuka honey  1 Application Topical Daily   levETIRAcetam  500 mg Oral BID   melatonin  3 mg Oral QHS   multivitamin  1 tablet Oral QHS   nutrition supplement (JUVEN)  1 packet Per Tube BID BM   mouth rinse  15 mL Mouth Rinse Q4H   rosuvastatin  10 mg Oral Daily   sodium chloride flush  3 mL Intravenous Q12H   vancomycin  500 mg Oral Q6H    anticoagulant sodium citrate     cefTRIAXone (ROCEPHIN)  IV     feeding supplement (NEPRO CARB STEADY) 1,000 mL (12/25/23 1550)   metronidazole     acetaminophen **OR** acetaminophen,  alteplase, anticoagulant sodium citrate, docusate, ipratropium-albuterol, labetalol, lidocaine (PF), lidocaine-prilocaine, ondansetron (ZOFRAN) IV, mouth rinse, pentafluoroprop-tetrafluoroeth, polyethylene glycol, Racepinephrine HCl, white petrolatum, witch hazel-glycerin   Tomasa Blase PA-C [facility] 12/26/2023,9:48 AM

## 2023-12-26 NOTE — Progress Notes (Signed)
 Speech Language Pathology Treatment: Dysphagia (family education via  phone)  Patient Details Name: Carolyn Russo MRN: [id number] DOB: 1951-05-04 Today's Date: 12/26/2023 Time: 4034-7425 SLP Time Calculation (min) (ACUTE ONLY): 17 min     Pt currently in HD. SLP called pt's husband to discuss nutrition status in regards to po (was asked to reach out to him by MD yesterday) for approximately 17 minutes. SLP explained pt is at risk for aspiration even with the modifications to texture, positioning etc and possible post prandial aspiration as she had a fluid filled esophagus on CT chest. SLP answered many question from husband and he stated he understood the risk and was okay with proceeding with modified diet however when relayed information to Dr. Thedore Mins he wants the pt to continue to be NPO and "not going to give her oral diet for another 5 days, she has C. difficile and if aspiration gets worse and she needs antibiotics her condition will worsen." Plan is to not initiate po's under further guidance from MD. SLP will however continue to see pt for trial respiratory muscle strength training and pharyngeal exercises which she has not been able to perform due to cognitive deficits. With the respiratory trainer on Monday she was able to use a few times then began to bite the mouthpiece. The husband states he will bring in her hearing aid and set up a time to be present during ST session for tomorrow.     Breck Coons [place].Ed Architect [phone]

## 2023-12-26 NOTE — Plan of Care (Signed)
  Problem: Coping: Goal: Ability to adjust to condition or change in health will improve Outcome: Progressing   Problem: Fluid Volume: Goal: Ability to maintain a balanced intake and output will improve Outcome: Progressing   Problem: Health Behavior/Discharge Planning: Goal: Ability to identify and utilize available resources and services will improve Outcome: Progressing Goal: Ability to manage health-related needs will improve Outcome: Progressing   Problem: Metabolic: Goal: Ability to maintain appropriate glucose levels will improve Outcome: Progressing   Problem: Nutritional: Goal: Maintenance of adequate nutrition will improve Outcome: Progressing Goal: Progress toward achieving an optimal weight will improve Outcome: Progressing   Problem: Skin Integrity: Goal: Risk for impaired skin integrity will decrease Outcome: Progressing   Problem: Education: Goal: Knowledge of disease or condition will improve Outcome: Progressing Goal: Knowledge of secondary prevention will improve (MUST DOCUMENT ALL) Outcome: Progressing Goal: Knowledge of patient specific risk factors will improve (DELETE if not current risk factor) Outcome: Progressing   Problem: Ischemic Stroke/TIA Tissue Perfusion: Goal: Complications of ischemic stroke/TIA will be minimized Outcome: Progressing   Problem: Coping: Goal: Will verbalize positive feelings about self Outcome: Progressing Goal: Will identify appropriate support needs Outcome: Progressing   Problem: Health Behavior/Discharge Planning: Goal: Ability to manage health-related needs will improve Outcome: Progressing Goal: Goals will be collaboratively established with patient/family Outcome: Progressing   Problem: Self-Care: Goal: Ability to participate in self-care as condition permits will improve Outcome: Progressing Goal: Verbalization of feelings and concerns over difficulty with self-care will improve Outcome:  Progressing Goal: Ability to communicate needs accurately will improve Outcome: Progressing   Problem: Nutrition: Goal: Dietary intake will improve Outcome: Progressing   Problem: Education: Goal: Knowledge of risk factors and measures for prevention of condition will improve Outcome: Progressing   Problem: Coping: Goal: Psychosocial and spiritual needs will be supported Outcome: Progressing   Problem: Respiratory: Goal: Will maintain a patent airway Outcome: Progressing Goal: Complications related to the disease process, condition or treatment will be avoided or minimized Outcome: Progressing   Problem: Education: Goal: Knowledge of General Education information will improve Description: Including pain rating scale, medication(s)/side effects and non-pharmacologic comfort measures Outcome: Progressing   Problem: Health Behavior/Discharge Planning: Goal: Ability to manage health-related needs will improve Outcome: Progressing   Problem: Clinical Measurements: Goal: Ability to maintain clinical measurements within normal limits will improve Outcome: Progressing Goal: Will remain free from infection Outcome: Progressing Goal: Diagnostic test results will improve Outcome: Progressing Goal: Respiratory complications will improve Outcome: Progressing Goal: Cardiovascular complication will be avoided Outcome: Progressing   Problem: Activity: Goal: Risk for activity intolerance will decrease Outcome: Progressing   Problem: Nutrition: Goal: Adequate nutrition will be maintained Outcome: Progressing   Problem: Coping: Goal: Level of anxiety will decrease Outcome: Progressing   Problem: Elimination: Goal: Will not experience complications related to bowel motility Outcome: Progressing Goal: Will not experience complications related to urinary retention Outcome: Progressing   Problem: Pain Managment: Goal: General experience of comfort will improve and/or be  controlled Outcome: Progressing   Problem: Safety: Goal: Ability to remain free from injury will improve Outcome: Progressing   Problem: Skin Integrity: Goal: Risk for impaired skin integrity will decrease Outcome: Progressing   Problem: Activity: Goal: Ability to tolerate increased activity will improve Outcome: Progressing   Problem: Respiratory: Goal: Ability to maintain a clear airway and adequate ventilation will improve Outcome: Progressing   Problem: Role Relationship: Goal: Method of communication will improve Outcome: Progressing

## 2023-12-26 NOTE — Progress Notes (Signed)
 Gave a call to HD department,as per the HD RN they wont be able to dialyze pt tonight cause they are  short staffed and will try to do it first thing in the morning.

## 2023-12-26 NOTE — Consult Note (Signed)
   Providing Compassionate, Quality Care - Together  Neurosurgery Consult  Referring physician: [facility] Reason for referral: Cervical osteo/discitis  History of Present Illness: Patient has a complicated medical history notable for ESRD receiving HD MWF, respiratory failure, CVA, HTN, T2DM, R BKA, seizure disorder. She initially presented with SOB/CP on 11/16/23, and has subsequently been highly variable in condition having been coded, intubated and extubated, current C diff infection, and requiring coretrack for high aspiration risk and overall FTT. Neurosurgery consulted for abnormal findings on CT Cervical spine.   Physical Exam:  Vital signs in last 24 hours: Temp:  [98 F (36.7 C)-98.3 F (36.8 C)] 98 F (36.7 C) (07/25 1814) Pulse Rate:  [58-128] 65 (07/26 0746) Resp:  [11-18] 14 (07/26 0217) BP: (138-182)/(65-125) 153/88 (07/26 0700) SpO2:  [91 %-98 %] 96 % (07/26 0746)  PE: Somnolent MAEs to stimuli PERRL  CT C spine: IMPRESSION: 1. High-grade height loss of the anterior inferior left greater than right aspect of the C6 vertebral body. This is entirely new from 05/05/2021 CT. Irregularity of the C6-7 endplates suspicious for discitis/osteomyelitis in the appropriate clinical setting. Recommend clinical correlation. 2. There is up to 5 mm combined retrolisthesis/retropulsion of the posteroinferior aspect of the C6 vertebral body at the C6-7 disc level. This contributes to mild C6-7 central canal stenosis. 3. No paravertebral collection is seen to indicate an abscess. 4. Mild right C3-4, C5-6, and C6-7 neuroforaminal stenosis.   Impression/Assessment:  This is a 73yo with  -presumed C6-7 osteomyelitis/discitis.   Plan:  -Patient a poor surgical candidate given numerous co morbidities and overall poor condition. Her capacity for healing is poor, and am doubtful that surgical intervention would provide meaningful improvement in quality of life. Recommend cervical collar  should patient be OOB and consideration of palliative care involvement.  Illias Pantano Margaree Mackintosh, PA-C

## 2023-12-26 NOTE — Progress Notes (Addendum)
 Patient report to room and settled in bed. Report received from dialysis RN, Victorino Dike. Per RN Patient HB was low and 1 U PRBC was given and no fluids was taken off. Per protocol patient awaits post transfusion CBC. Vitals at this time was stable with patient being very drowsy.

## 2023-12-26 NOTE — Progress Notes (Signed)
 OT Cancellation Note  Patient Details Name: Carolyn Russo MRN: [id number] DOB: 1951/10/14   Cancelled Treatment:    Reason Eval/Treat Not Completed: Patient at procedure or test/ unavailable.   Will follow up tomorrow  Malachi Bonds 12/26/2023, 4:08 PM Hal Neer OTR/L

## 2023-12-26 NOTE — Progress Notes (Signed)
 Stat type and cross sent to the blood bank at this time--called to confirm arrival--

## 2023-12-26 NOTE — Telephone Encounter (Signed)
 Dr Duke Salvia spoke with Dr Harlon Flor 3/3

## 2023-12-26 NOTE — Progress Notes (Signed)
 Daily Progress Note   Patient Name: Carolyn Russo       Date: 12/26/2023 DOB: [birth year]  Age: 73 y.o. MRN#: [id number] Attending Physician: Leroy Sea, MD Primary Care Physician: Annita Brod, MD Admit Date: 11/16/2023 Length of Stay: 40 days  Reason for Consultation/Follow-up: Establishing goals of care  HPI/Patient Profile:  11F with ESRD on HD, CHF, prior CVA, DMII, right BKA and seizure disorder who is admitted with acute hypoxemic respiratory failure due to Covid and RSV pneumonia, c/b acute right PICA infarct and PEA arrest. Extubated 2/9 and stable on [place], has lots of secretions. Concern for high risk of deterioration. Family states would want full scope of care, full code. Husband is a physician and has support from their son. They want to engage with palliative team for support.   Subjective:   Subjective: Chart Reviewed. Updates received. Patient Assessed. Created space and opportunity for patient  and family to explore thoughts and feelings regarding current medical situation.  Today's Discussion: Today saw the patient at bedside, she is resting/sleeping peacefully I elected to not wake her.  Her respirations are even and unlabored.  Vital signs are stable.  At the bedside with the patient's husband.  I reintroduced myself as palliative medicine and explained that our goal is to help with the patient's situation, ensure that the healthcare team is providing care in line with goals of care with a full understanding of what treatment options offer for the patient's wife.  We spent significant time reviewing significant changes in events over the past week since palliative last checked in.  We spent time talking about mental status changes and lack of capacity.  We discussed her course thus far including multiple episodes of aspiration.  We discussed that she is required multiple courses of antibiotics.  For a few days last week they thought that she was doing well and eating, did  not appear to be coughing or actively aspirating.  However, she began spiking a fever and CT is consistent with possible aspiration pneumonia.  She was made n.p.o. again and a core track was replaced.  We discussed that with imaging showing a fluid filled esophagus that she is likely aspirating reflux, which a core track/feeding tube will not help, as well as likely aspirating secretions.  Previously PEG tube was held because not wanting to hold anticoagulation, however PEG tube would likely not alleviate this as well.  Husband understands.  We discussed that given multiple rounds of antibiotics the patient now has tested positive for C. difficile colitis as well.  She is getting vancomycin for this and infectious disease is helping to guide her antibiotic treatment for her pneumonia, offering a shortened duration to prevent worsening of C. difficile.  We had some discussion about pathophysiology of C. difficile colitis.  Finally, we discussed the abnormal C6 findings.  I pulled up the report and shared it with her husband showing what appears to be progressive changes, since even admission, and concerns for possible discitis/osteomyelitis.  We discussed that this would be another very difficult infection to face.  White count is up and so is procalcitonin.    At this point the patient's husband called their son who is in [place].  We spent more time reviewing the previously discussed situations as well as the abnormal imaging.  Patient signed was asking detailed questions about what changes were seen to indicate discitis/osteomyelitis.  I attempted to answer these but could not offer why those specific changes are suggestive  of that pathology, and offered neurosurgery and to help further discuss.  Family is also requesting CT images to be burned to a disc that they can share with their physician friends at [facility], where her son lives in [place].  I requested nursing to pass this along to see if we  could make this happen.  At the end of her conversation we decided that we need to hear from neurosurgery, who is seeing the patient today.  This would provide more information to help in decision-making.  I also shared the summary that the patient is very sick, fighting multiple battles on multiple friends.  I shared that frankly we are very worried about her her health.  Family seems to understand this.  I offered that I would follow-up tomorrow to check in and see how things are going, help support further discussions amongst family to elicit updated goals of care. I provided emotional and general support through therapeutic listening, empathy, sharing of stories, and other techniques. I answered all questions and addressed all concerns to the best of my ability.   Review of Systems  Unable to perform ROS (Patient was sleeping and I elected to not try to wake her)  Objective:   Vital Signs:  BP (!) 109/39   Pulse 87   Temp (!) 101.5 F (38.6 C) (Axillary)   Resp (!) 24   Ht 5\' 3"  (1.6 m)   Wt 66.2 kg   SpO2 99%   BMI 25.85 kg/m   Physical Exam Vitals and nursing note reviewed.  Constitutional:      General: She is sleeping. She is not in acute distress.    Appearance: She is ill-appearing.  HENT:     Head: Normocephalic and atraumatic.  Cardiovascular:     Rate and Rhythm: Normal rate.  Pulmonary:     Effort: Pulmonary effort is normal. No respiratory distress.  Skin:    General: Skin is warm and dry.     Palliative Assessment/Data: 10% (CoreTrak in place)    Existing Vynca/ACP Documentation: None  Assessment & Plan:   Impression: Present on Admission:  Pneumonia  SUMMARY OF RECOMMENDATIONS   Full code Full scope of care Awaiting discussion with neurosurgery Nursing to attempt to get CT imaging burned to CD for family Palliative medicine will follow-up tomorrow We will support ongoing GOC discussions Palliative medicine will continue to  follow  Symptom Management:  Per primary team PMT is available to assist as needed  Code Status: Full code  Prognosis: Unable to determine  Discharge Planning: To Be Determined  Discussed with: Medical team, nursing team, patient's family  Thank you for allowing [place] to participate in the care of Brittiney ARLICIA PAQUETTE PMT will continue to support holistically.  Time Total: 105 min  Detailed review of medical records (labs, imaging, vital signs), medically appropriate exam, discussed with treatment team, counseling and education to patient, family, & staff, documenting clinical information, medication management, coordination of care  Wynne Dust, NP Palliative Medicine Team  Team Phone # [phone] (Nights/Weekends)  06/22/2021, 8:17 AM

## 2023-12-26 NOTE — Progress Notes (Signed)
 Critical lab call to me ---this pts am hgb is 6.3---verified---called Dr. Arlean Hopping and he ordered a stat type and cross and 2 units of blood to be prepared---goal is to attempt to get 1 unit in during HD and have the floor draw a hgb in 1 hour after the completion and the floor will resume the evaluation of the 2nd unit---

## 2023-12-26 NOTE — Progress Notes (Signed)
 PT Cancellation Note  Patient Details Name: Carolyn Russo MRN: [id number] DOB: 1951-04-20   Cancelled Treatment:    Reason Eval/Treat Not Completed: Patient at procedure or test/unavailable  Patient is off unit this morning for HD; will check back later as schedule permits. Otherwise will plan to follow up for therapy session tomorrow.  Kathlyn Sacramento, PT, DPT [facility]  Rehabilitation Services Physical Therapist Office: [phone] Website: [email or web address]   Berton Mount 12/26/2023, 8:39 AM

## 2023-12-26 NOTE — Progress Notes (Signed)
 Patient left to have dialysis done.

## 2023-12-26 NOTE — Progress Notes (Addendum)
 PROGRESS NOTE                                                                                                                                                                                                             Patient Demographics:    Carolyn Russo, is a 73 y.o. female, DOB - 05/31/1951, ZOX:[id number]  Outpatient Primary MD for the patient is Annita Brod, MD    LOS - 40  Admit date - 11/16/2023    Chief Complaint  Patient presents with   Chest Pain   Dialysis pt       Brief Narrative (HPI from H&P)   73 yo female with ESRD on HD MWF, CHF, previous CVA with residual left sided weakness, T2DM, right BKA, and seizure disorder. She presented to [facility] on 1/23 with confusion, left sided chest pain, shortness of breath, and coughing. She was found to be hypoxic on room air and placed on 2 L [place]. CT chest was done without evidence of PE.  MRI revealed a 1.3cm acute to early subacute right PICA infarct. Also was admitted with pneumonia due to COVID and RSV and was started on antibiotics for likely superimposed bacterial infection.    On 1/30 early morning the patient was ~2 hrs into dialysis when her blood pressure dropped. UF goal was decreased but since the patient's level of consciousnesses decreased, she received 300 cc NS bolus. EKG changes were noted and rapid response was activated, dialysis stopped, and blood returned to the patient. Around 6:30 AM the patient was noted to be apneic without a pulse and code blue was called. Patient was noted to have PEA arrest. CPR was done for a few minutes before ROSC was achieved. She was transferred to 74M and patient was intubated due to failure to protect airway.  She continued to be tenuous and minimally responsive, finally she was extubated on 12/03/2023 transferred to my care on 12/09/2023, early in the morning she was fairly stable however within a few hours she had an episode of  emesis witnessed by her husband subsequently developed A-fib RVR and respiratory distress, cardiology PCCM again called bedside and she was transferred to ICU, she stayed there for another 5 days stabilized however still dependent on NG tube feeds, was seen by palliative care.  She was transferred to my service on 12/16/2023 again.  Overall condition report is extremely tenuous, still very high risk for aspiration, still overall extremely weak and deconditioned, family for now wants to pursue all modalities of treatment.  She is currently on NG tube, wanted to hold off PEG tube placement as it requires holding of DAPT to 5 days and increases her risk of ischemic stroke, she was seen by speech therapy placed on oral diet, core track was removed on 12/23/2023 but subsequently she aspirated again requiring core track replacement on 12/25/2023.  She now has worsening leukocytosis and evidence of active C. difficile infection as well, ID, PCCM, renal involved.  Also repeat CT scan of C-spine on 12/26/2023 shows suspicion for discitis or osteomyelitis, neurosurgery will opine as well.  Again long-term prognosis does not appear good, multiple medical problems with extremely poor baseline.   Significant Hospital Events & Prcedures:    1/23 admit to [facility] for acute infarct + covid/rsv pneumonia 1/30 admit to ICU after patient had PEA arrest  2/1 Failed SBT due to apnea. ABG with respiratory alkalosis. RR decreased 2/2 Tolerating SBT. Remains encephalopathic 2/5 Continues to tolerate SBT during day, but remains encephalopathic/unable to protect airway if extubated  2/9 extubated, off precedex 2/12 still lethargic, ABG 2/11 unremarkable, limit centrally acting meds 2/15 transferred to my care, still somnolent on NG tube feeds, aspirated her vomitus, again in respiratory distress, considered back to ICU 12/14/2023 transferred by from ICU, transferred to my service on 12/16/2023 12/19/2023.  Modified barium swallow.  Started  on dysphagia 1 diet with honey thick liquids 12/23/2023.  NG tube discontinued 12/24/2023.  CT chest.  Suspicious for worsening pneumonia 12/25/2023, will reinsert core track/NG tube n.p.o. by mouth 12/26/2023 CT C-spine - 1. High-grade height loss of the anterior inferior left greater than right aspect of the C6 vertebral body. This is entirely new from 05/05/2021 CT. Irregularity of the C6-7 endplates suspicious for discitis/osteomyelitis in the appropriate clinical setting.  12/26/2023 C. difficile positive   Unfortunately overall patient continues to appear quite weak and frail, sleepy NG tube feeding dependent, very weak cough reflex, very high risk for recurrent aspiration of oral secretions and regurgitated food material, overall still bedbound, overall prognosis appears poor explained to patient's husband clearly bedside on 12/17/2023.  Called and discussed with son on 12/18/2023 as well.    Subjective:   Patient in bed, appears comfortable, denies any headache, no fever, no chest pain or pressure, no shortness of breath , cough, no abdominal pain.   Assessment  & Plan :    PEA arrest: due to hypotension during HD, not primarily cardiac in nature, intubated for airway protection and stabilized in ICU, seen by cardiology initially, now stable from cardiac arrest standpoint.   Acute encephalopathy: secondary to post-arrest, stroke, metabolic derangements, and drug induced. Concern for cefepime toxicity given her hx of ESRD, med was discontinued 2/4. MRI brain did show a new and acute central left cerebellar infarct.  Appreciate neurology recs.  Stroke not felt to be significant enough to explain altered mental status, Seroquel and amitriptyline have been stopped, mentation gradually improving appears to have poor baseline.   Multiple episodes of aspiration pneumonia, aspiration on 12/09/2023 followed by acute hypoxic respiratory failure with respiratory distress on core track tube feed, went back to  ICU, commenced on dysphagia 1 diet on 12/23/2023 but now evidence of aspiration pneumonia again on dysphagia 1 diet.  Has been seen by PCCM team, previously treated with antibiotics and aspiration pneumonia had improved, unfortunately due to decreased  mental status and generalized weakness she remains high risk for aspiration of oral contents or regurgitating gastric contents, she was due for PEG tube placement however family decided to hold off on PEG tube as they did not want patient to be off of antiplatelets for 5 days which increases her risk of stroke.  And she aspirated dysphagia 1 diet again on 12/24/2023, oral diet has been stopped and NG tube has been replaced, of note patient has previously aspirated on NG tube feeds as well.  Beach therapy following, PCCM following, long-term prognosis poor, she has now developed C. difficile colitis also due to multiple courses of antibiotics during her hospital stay, kindly see my previous note.  ID on board plan is to give total 4 days of antibiotics for pneumonia to reduce the chances of C. difficile worsening.  Try to keep maximum tube feed rate at 35 cc an hour.  Tends to aspirate above that.  ID and PCCM will be consulted as well.  Prognosis again remains very poor.   C. difficile infection.  C. difficile came back + 12/26/2023, ID on board Case discussed with ID, per ID total 3 days of antibiotics for pneumonia, will be transition to Rocephin and Flagyl, she has had multiple antibiotic courses in the past for various aspiration pneumonias and sinusitis infections, oral vancomycin for C. difficile infection has been started on 12/26/2023.   Abnormal C6 signal  - MRI showed a nonspecific bone marrow signal of c6 vertebral body - unclear if related to a malignancy vs discitis/osteomyelitis.  He had expressed desire to hold off on workup till mental status improved however Since her white count is worsening.  CT C-spine on 12/25/2023 showing suspicion for C-spine  discitis/osteomyelitis, again due to C. difficile now options are pretty limited, have requested neurosurgery to take a look and opine.  Leukocytosis Sinusitis - MRI brain noted progressed mastoid effusions, she has finished her treatment with Augmentin in ICU.  Acute to early subacute right PICA infarct on 1/24, resolved New acute central left cerebellar infarct with dysphagia, previous residual left-sided hemiparesis  - Appreciate neuro follow up , on DAPT with aspirin and brilinta along with statin, PEG tube held back per family request, currently fortunately has aspirated oral diet again on 12/24/2023.  Made her n.p.o., new core track on 12/27/2023.  Tube feeds commenced on 3 02/24/2024.  Of note patient tends to aspirate on tube feeds over 35 cc an hour.  She aspirated on tube feeds at 45 cc an hour via core track few weeks ago.    Paroxysmal atrial fibrillation with question of possible underlying sick sinus syndrome.  Seen by cardiology.  Currently on oral amiodarone.  Long-term anticoagulation not recommended by cardiology this admission.  Needs 30-day event monitor upon discharge.  Hypertension  - Currently on hydralazine via tube.   Seizure disorder  - Continue home keppra 500 mg BID seen by neurology this admission.   ESRD on HD MWF - Dialysis per nephro - Discontinued renvela due to low phos levels   Anemia of chronic disease - 1 unit blood on 2/10, 1 more 12/26/23, no signs of active bleeding  - Aranesp every Friday    Unstageable sacral decubitus ulcer.  Kindly see picture placed in the chart on multiple dates going back 12/02/23, wound care team following, evaluated by general surgery 12/25/2023, no intervention suggested by general surgery, no infection noted.  Continue wound care discussed with ostomy nurses as well.  Clearly explained to the  family that sacral decubitus ulcer due to bedbound state usually is gradually progressive.  Continue Flexi-Seal to protect the skin as much as  possible and frequent turning along with wound care.  DM. type II - Continue long-acting insulin along with ISS, dose adjusted due to change in diet on 12/22/2023  CBG (last 3)  Recent Labs    12/25/23 1946 12/25/23 2306 12/26/23 0509  GLUCAP 181* 237* 180*         Condition - Extremely Guarded  Family Communication  :    Updated son [phone]  in detail on 12/18/2023, wants to hold off on PEG tube till 2/29/2025, called 12/20/2023 at 8:20 AM.  No response.  Son updated 12/24/2023 in detail  Husband x 2 on 12/09/23, husband bedside 12/17/2023, called [phone] and left message on 12/20/2023 at 8:24 AM, called [phone] - updated husband updated bedside in detail on, 12/23/2023, 12/24/2023, over the phone on 12/25/2023, updated [phone]  on 12/26/23.   Code Status :  Full  Consults  :  PCCM, Cards, Pall Care, Renal, ID, IR, general surgery, N.Surgery  PUD Prophylaxis : PPI   Procedures  :            Disposition Plan  :    Status is: Inpatient    DVT Prophylaxis  :    SCDs Start: 11/23/23 0716 heparin injection 5,000 Units Start: 11/17/23 0930     Lab Results  Component Value Date   PLT 577 (H) 12/25/2023    Diet :  Diet Order             Diet NPO time specified Except for: Sips with Meds  Diet effective now                    Inpatient Medications  Scheduled Meds:  arformoterol  15 mcg Nebulization BID   aspirin  81 mg Oral Daily   budesonide (PULMICORT) nebulizer solution  0.5 mg Nebulization BID   Chlorhexidine Gluconate Cloth  6 each Topical Q0600   Chlorhexidine Gluconate Cloth  6 each Topical Q0600   clopidogrel  75 mg Oral Daily   darbepoetin (ARANESP) injection - DIALYSIS  200 mcg Subcutaneous Q Mon-1800   feeding supplement (PROSource TF20)  60 mL Per Tube Daily   ferrous sulfate  300 mg Oral Daily   fiber supplement (BANATROL TF)  60 mL Oral BID   heparin injection (subcutaneous)  5,000 Units Subcutaneous Q8H   hydrALAZINE  50 mg  Oral Q8H   insulin aspart  0-9 Units Subcutaneous Q6H   leptospermum manuka honey  1 Application Topical Daily   levETIRAcetam  500 mg Oral BID   melatonin  3 mg Oral QHS   multivitamin  1 tablet Oral QHS   nutrition supplement (JUVEN)  1 packet Per Tube BID BM   mouth rinse  15 mL Mouth Rinse Q4H   rosuvastatin  10 mg Oral Daily   sodium chloride flush  3 mL Intravenous Q12H   vancomycin  500 mg Oral Q6H   Continuous Infusions:  anticoagulant sodium citrate     feeding supplement (NEPRO CARB STEADY) 1,000 mL (12/25/23 1550)   PRN Meds:.acetaminophen **OR** acetaminophen, alteplase, anticoagulant sodium citrate, docusate, ipratropium-albuterol, labetalol, lidocaine (PF), lidocaine-prilocaine, ondansetron (ZOFRAN) IV, mouth rinse, pentafluoroprop-tetrafluoroeth, polyethylene glycol, Racepinephrine HCl, white petrolatum, witch hazel-glycerin    Objective:   Vitals:   12/25/23 2336 12/26/23 0500 12/26/23 0515 12/26/23 0520  BP: (!) 124/48   (!) 123/49  Pulse: 90  Resp: (!) 21     Temp: 99.4 F (37.4 C)  99 F (37.2 C)   TempSrc: Axillary     SpO2: 100%     Weight:  65.8 kg    Height:        Wt Readings from Last 3 Encounters:  12/26/23 65.8 kg  11/01/23 79.5 kg  08/24/23 83 kg     Intake/Output Summary (Last 24 hours) at 12/26/2023 0808 Last data filed at 12/25/2023 1522 Gross per 24 hour  Intake 483.71 ml  Output --  Net 483.71 ml    Physical Exam  Awake and alert this morning, following all commands,  R BKA, NG tube Hines.AT,PERRAL Supple Neck, No JVD,   Symmetrical Chest wall movement, Good air movement bilaterally, bilateral breath sounds RRR,No Gallops,Rubs or new Murmurs,  +ve B.Sounds, Abd Soft, No tenderness,   No Cyanosis, Clubbing or edema     RN pressure injury documentation: Pressure Injury 12/02/23 Sacrum Medial Stage 2 -  Partial thickness loss of dermis presenting as a shallow open injury with a red, pink wound bed without slough. (Active)   12/02/23 1402  Location: Sacrum  Location Orientation: Medial  Staging: Stage 2 -  Partial thickness loss of dermis presenting as a shallow open injury with a red, pink wound bed without slough.  Wound Description (Comments):   Present on Admission: No  Dressing Type Foam - Lift dressing to assess site every shift 12/25/23 1600      Data Review:    Recent Labs  Lab 12/21/23 0455 12/22/23 0552 12/23/23 0556 12/24/23 0550 12/25/23 0526  WBC 21.0* 19.2* 17.0* 23.8* 29.0*  HGB 8.2* 7.6* 7.9* 7.4* 7.4*  HCT 24.1* 22.2* 23.5* 22.1* 21.9*  PLT 479* 491* 518* 552* 577*  MCV 93.4 92.5 92.9 92.9 93.6  MCH 31.8 31.7 31.2 31.1 31.6  MCHC 34.0 34.2 33.6 33.5 33.8  RDW 17.8* 17.1* 17.2* 16.9* 16.7*  LYMPHSABS 3.8 3.4 2.9 3.7 3.9  MONOABS 2.9* 2.1* 2.1* 2.6* 2.6*  EOSABS 0.2 0.3 0.2 0.1 0.2  BASOSABS 0.1 0.1 0.1 0.1 0.1    Recent Labs  Lab 12/20/23 1025 12/21/23 0925 12/22/23 0552 12/23/23 0556 12/24/23 0550 12/25/23 0526 12/25/23 1134  NA 132*  --  133*  --  136  --  132*  K 4.9  --  4.5  --  4.4  --  4.5  CL 95*  --  94*  --  92*  --  91*  CO2 26  --  23  --  25  --  22  ANIONGAP 11  --  16*  --  19*  --  19*  GLUCOSE 137*  --  103*  --  202*  --  166*  BUN 98*  --  94*  --  81*  --  106*  CREATININE 5.35*  --  5.14*  --  4.59*  --  6.37*  ALBUMIN  --   --  2.3*  --  2.3*  --  2.4*  CRP 4.2* 7.8* 7.5* 8.8* 12.8* 14.2*  --   PROCALCITON 2.04 2.26 2.28 1.65 1.82 1.81  --   PHOS  --   --  3.5  --  4.0  --  4.3  CALCIUM 9.0  --  9.3  --  10.1  --  9.3      Recent Labs  Lab 12/20/23 1025 12/21/23 0925 12/22/23 0552 12/23/23 0556 12/24/23 0550 12/25/23 0526 12/25/23 1134  CRP 4.2* 7.8* 7.5* 8.8* 12.8*  14.2*  --   PROCALCITON 2.04 2.26 2.28 1.65 1.82 1.81  --   CALCIUM 9.0  --  9.3  --  10.1  --  9.3    --------------------------------------------------------------------------------------------------------------- Lab Results  Component Value Date   CHOL 90  11/17/2023   HDL 38 (L) 11/17/2023   LDLCALC 37 11/17/2023   TRIG 86 12/02/2023   CHOLHDL 2.4 11/17/2023    Lab Results  Component Value Date   HGBA1C 5.8 (H) 10/26/2023    Micro Results Recent Results (from the past 240 hours)  C Difficile Quick Screen (NO PCR Reflex)     Status: Abnormal   Collection Time: 12/26/23  6:02 AM   Specimen: STOOL  Result Value Ref Range Status   C Diff antigen POSITIVE (A) NEGATIVE Final    Comment: CRITICAL RESULT CALLED TO, READ BACK BY AND VERIFIED WITH: D GRIFFIN,RN@0720  12/26/23 MK    C Diff toxin POSITIVE (A) NEGATIVE Final   C Diff interpretation Toxin producing C. difficile detected.  Final    Comment: Performed at [facility], [address], [place], [place] [address]    Radiology Report DG Chest Port 1 View Result Date: 12/26/2023 CLINICAL DATA:  [id number] with shortness of breath. EXAM: PORTABLE CHEST 1 VIEW COMPARISON:  Chest CT without contrast 12/24/2023 at 12:13 p.m. FINDINGS: 6:21 a.m. Feeding tube extends well into the stomach but the radiopaque tip is not in the film. Right IJ dialysis catheter again terminates in the right atrium. There is stable cardiomegaly, mild central vascular fullness without edema. Similar appearance of airspace disease in the lung bases, minimal pleural effusions. Remaining lungs clear. Stable mediastinum with aortic atherosclerosis. No new osseous findings. Moderate thoracic dextroscoliosis. IMPRESSION: 1. Similar appearance of airspace disease in the lung bases, minimal pleural effusions. 2. Stable cardiomegaly and mild central vascular fullness without edema. 3. Aortic atherosclerosis. 4. Support apparatus as above. Electronically Signed   By: Almira Bar M.D.   On: 12/26/2023 07:14   CT CERVICAL SPINE WO CONTRAST Result Date: 12/25/2023 CLINICAL DATA:  Cervical radiculopathy.  Infection suspected. EXAM: CT CERVICAL SPINE WITHOUT CONTRAST TECHNIQUE: Multidetector CT imaging of the cervical spine was  performed without intravenous contrast. Multiplanar CT image reconstructions were also generated. RADIATION DOSE REDUCTION: This exam was performed according to the departmental dose-optimization program which includes automated exposure control, adjustment of the mA and/or kV according to patient size and/or use of iterative reconstruction technique. COMPARISON:  CT chest, abdomen, and pelvis 10/24/2023, CT chest 11/16/2023 CTA neck 05/05/2021 FINDINGS: Alignment: The atlantodens interval is intact. There is 5 mm combined retrolisthesis/retropulsion of the posteroinferior aspect of the C6 vertebral body at the C6-7 disc level (sagittal series 11, image 34). Skull base and vertebrae: There is high-grade height loss of the anterior inferior left greater than right aspect of the C6 vertebral body. This height loss is entirely new from 05/05/2021 CT. This is mildly worsened from 11/16/2023 and more definitively worsened from 10/24/2023 CTs. There is moderate concave scalloping of the mid to anterior aspect of the C6 inferior endplate. There is irregularity of the C6-7 endplates suspicious for discitis/osteomyelitis in the appropriate clinical setting. Mild sclerosis around this endplate irregularity. Moderate to severe T1-T2 disc space narrowing. No acute fracture is seen. There is opacification of the visualized portion of the left mastoid air cells. There is normal variant incomplete closure of the posterior arch of C1. Soft tissues and spinal canal: No prevertebral fluid or swelling. No visible canal hematoma. Nasogastric tube  is partially visualized. No paravertebral collection is seen to indicate an abscess. Disc levels: C2-3: Mild bilateral facet joint hypertrophy. Mild right-greater-than-left uncovertebral hypertrophy. Mild right-greater-than-left posterior disc bulge. Mild right lateral recess narrowing. Very mild right neuroforaminal narrowing without true stenosis. No central canal stenosis. C3-4:  Mild-to-moderate bilateral facet joint hypertrophy. Mild bilateral uncovertebral hypertrophy. Mild posterior disc bulge. Mild right neuroforaminal stenosis. No central canal stenosis. C4-5: Mild-to-moderate bilateral facet joint hypertrophy. No posterior disc bulge. No central canal or neuroforaminal stenosis. C5-6: Mild-to-moderate bilateral facet joint hypertrophy. Mild-to-moderate bilateral uncovertebral hypertrophy. Mild right neuroforaminal narrowing. No central canal stenosis. C6-7: There is up to 5 mm combined retrolisthesis/retropulsion of the posteroinferior aspect of the C6 vertebral body. This contributes to mild central canal stenosis. Mild right neuroforaminal stenosis. C7-T1: Mild bilateral facet joint hypertrophy. No posterior disc bulge, central canal narrowing, or neuroforaminal stenosis. Upper chest: The lung apices are unremarkable. Other: There are dense atherosclerotic calcifications within the visualized carotid bulbs. Partial visualization of a right internal jugular approach central venous catheter. IMPRESSION: 1. High-grade height loss of the anterior inferior left greater than right aspect of the C6 vertebral body. This is entirely new from 05/05/2021 CT. Irregularity of the C6-7 endplates suspicious for discitis/osteomyelitis in the appropriate clinical setting. Recommend clinical correlation. 2. There is up to 5 mm combined retrolisthesis/retropulsion of the posteroinferior aspect of the C6 vertebral body at the C6-7 disc level. This contributes to mild C6-7 central canal stenosis. 3. No paravertebral collection is seen to indicate an abscess. 4. Mild right C3-4, C5-6, and C6-7 neuroforaminal stenosis. Electronically Signed   By: Neita Garnet M.D.   On: 12/25/2023 17:16   DG Abd Portable 1V Result Date: 12/25/2023 CLINICAL DATA:  Feeding tube placement. EXAM: PORTABLE ABDOMEN - 1 VIEW COMPARISON:  Abdominal x-ray dated December 01, 2023. FINDINGS: Feeding tube tip in the distal stomach  near the pylorus. Large-bore enteric tube no longer identified. Normal bowel gas pattern. Oral contrast throughout the relatively decompressed colon. IMPRESSION: 1. Feeding tube tip in the distal stomach near the pylorus. Electronically Signed   By: Obie Dredge M.D.   On: 12/25/2023 12:34   CT CHEST WO CONTRAST Result Date: 12/24/2023 CLINICAL DATA:  Pneumonia, complication suspected, xray done EXAM: CT CHEST WITHOUT CONTRAST TECHNIQUE: Multidetector CT imaging of the chest was performed following the standard protocol without IV contrast. RADIATION DOSE REDUCTION: This exam was performed according to the departmental dose-optimization program which includes automated exposure control, adjustment of the mA and/or kV according to patient size and/or use of iterative reconstruction technique. COMPARISON:  Chest radiographs 12/24/2023 and 12/21/2023. Chest CT 11/16/2023 and 10/24/2023 FINDINGS: Cardiovascular: Atherosclerosis of the aorta, great vessels and coronary arteries. Right IJ hemodialysis catheter extends well into the right atrium near the tricuspid valve. Stable mild cardiac enlargement without significant pericardial effusion. Mediastinum/Nodes: Grossly stable right hilar enlargement, suboptimally evaluated on noncontrast imaging. No enlarged mediastinal or axillary lymph nodes are identified. The esophagus is fluid-filled and mildly distended. Lungs/Pleura: No significant residual pleural effusion or pneumothorax. Similar dependent left lower lobe airspace disease, likely atelectasis. Mildly increased band like opacity in the right middle and lower lobes, also likely atelectasis. Upper abdomen: The visualized upper abdomen appears stable, without acute findings. There are diffuse vascular calcifications. Mild renal cortical thinning bilaterally. Musculoskeletal/Chest wall: Multiple left-sided rib fractures are demonstrated, not seen on recent chest CT. These have ill-defined margins and mild callus,  likely subacute. Fractures of the left 4th and 5th ribs are mildly displaced (image  143/7). There are nondisplaced fractures of the left 2nd, 3rd and 6th ribs. There is a stable chronic moderate convex right thoracolumbar scoliosis. Severe disc space narrowing, irregularity and vertebral body partial collapse at C6-7. These findings are similar to the previous chest CT but new from CT of the neck 06/22/2022. This appearance can be seen with discitis/osteomyelitis or hemodialysis associated spondyloarthropathy. IMPRESSION: 1. Multiple left-sided rib fractures, not seen on recent chest CT, likely subacute. 2. No significant residual pleural effusion or pneumothorax. Similar dependent left lower lobe airspace disease, likely atelectasis. Mildly increased band like opacity in the right middle and lower lobes, also likely atelectasis. In this clinical context, findings could be due to pneumonia. 3. Severe disc space narrowing, irregularity and vertebral body partial collapse at C6-7 which could reflect discitis/osteomyelitis or hemodialysis associated spondyloarthropathy. Consider further evaluation with CT or MRI (preferred) of the cervical spine. 4. Fluid-filled and mildly distended esophagus, likely due to reflux. 5.  Aortic Atherosclerosis (ICD10-I70.0). Electronically Signed   By: Carey Bullocks M.D.   On: 12/24/2023 13:37   DG Chest Port 1 View Result Date: 12/24/2023 CLINICAL DATA:  Shortness of breath EXAM: PORTABLE CHEST 1 VIEW COMPARISON:  12/21/2023 FINDINGS: Reverse apical lordotic positioning. Dialysis catheter tip at low right atrium. Removal of feeding tube. Moderate cardiomegaly. Pleuroparenchymal opacification in the inferior left hemithorax is similar. Subsegmental atelectasis at the right lung base is unchanged. No pneumothorax. IMPRESSION: 1. Similar pleuroparenchymal opacification in the inferior left hemithorax. Likely trace pleural fluid and adjacent airspace disease. 2.  Cardiomegaly without  congestive failure. Electronically Signed   By: Jeronimo Greaves M.D.   On: 12/24/2023 10:20      Signature  -   Susa Raring M.D on 12/26/2023 at 8:08 AM   -  To page go to [email or web address]

## 2023-12-27 DIAGNOSIS — N186 End stage renal disease: Secondary | ICD-10-CM | POA: Diagnosis not present

## 2023-12-27 DIAGNOSIS — J189 Pneumonia, unspecified organism: Secondary | ICD-10-CM | POA: Diagnosis not present

## 2023-12-27 DIAGNOSIS — Z992 Dependence on renal dialysis: Secondary | ICD-10-CM | POA: Diagnosis not present

## 2023-12-27 DIAGNOSIS — G9340 Encephalopathy, unspecified: Secondary | ICD-10-CM | POA: Diagnosis not present

## 2023-12-27 LAB — CBC WITH DIFFERENTIAL/PLATELET
Abs Immature Granulocytes: 0 10*3/uL (ref 0.00–0.07)
Basophils Absolute: 0.5 10*3/uL — ABNORMAL HIGH (ref 0.0–0.1)
Basophils Relative: 2 %
Eosinophils Absolute: 0.5 10*3/uL (ref 0.0–0.5)
Eosinophils Relative: 2 %
HCT: 20.1 % — ABNORMAL LOW (ref 36.0–46.0)
Hemoglobin: 6.9 g/dL — CL (ref 12.0–15.0)
Lymphocytes Relative: 16 %
Lymphs Abs: 4 10*3/uL (ref 0.7–4.0)
MCH: 30.8 pg (ref 26.0–34.0)
MCHC: 34.3 g/dL (ref 30.0–36.0)
MCV: 89.7 fL (ref 80.0–100.0)
Monocytes Absolute: 2 10*3/uL — ABNORMAL HIGH (ref 0.1–1.0)
Monocytes Relative: 8 %
Neutro Abs: 18.1 10*3/uL — ABNORMAL HIGH (ref 1.7–7.7)
Neutrophils Relative %: 72 %
Platelets: 416 10*3/uL — ABNORMAL HIGH (ref 150–400)
RBC: 2.24 MIL/uL — ABNORMAL LOW (ref 3.87–5.11)
RDW: 16.9 % — ABNORMAL HIGH (ref 11.5–15.5)
WBC: 25.2 10*3/uL — ABNORMAL HIGH (ref 4.0–10.5)
nRBC: 0 % (ref 0.0–0.2)
nRBC: 0 /100{WBCs}

## 2023-12-27 LAB — PROCALCITONIN: Procalcitonin: 2.98 ng/mL

## 2023-12-27 LAB — GLUCOSE, CAPILLARY
Glucose-Capillary: 195 mg/dL — ABNORMAL HIGH (ref 70–99)
Glucose-Capillary: 211 mg/dL — ABNORMAL HIGH (ref 70–99)
Glucose-Capillary: 216 mg/dL — ABNORMAL HIGH (ref 70–99)
Glucose-Capillary: 225 mg/dL — ABNORMAL HIGH (ref 70–99)
Glucose-Capillary: 237 mg/dL — ABNORMAL HIGH (ref 70–99)
Glucose-Capillary: 238 mg/dL — ABNORMAL HIGH (ref 70–99)
Glucose-Capillary: 252 mg/dL — ABNORMAL HIGH (ref 70–99)

## 2023-12-27 LAB — C-REACTIVE PROTEIN: CRP: 15.6 mg/dL — ABNORMAL HIGH (ref <1.0)

## 2023-12-27 LAB — PREPARE RBC (CROSSMATCH)

## 2023-12-27 MED ORDER — VANCOMYCIN 50 MG/ML ORAL SOLUTION
125.0000 mg | Freq: Four times a day (QID) | ORAL | Status: DC
Start: 2023-12-27 — End: 2023-12-29
  Administered 2023-12-27 – 2023-12-28 (×4): 125 mg via ORAL
  Filled 2023-12-27 (×8): qty 2.5

## 2023-12-27 MED ORDER — SODIUM CHLORIDE 0.9% IV SOLUTION: Freq: Once | INTRAVENOUS | Status: AC

## 2023-12-27 NOTE — Progress Notes (Addendum)
 Physical Therapy Treatment Patient Details Name: Carolyn Russo MRN: [id number] DOB: [birth year] Today's Date: 12/27/2023   History of Present Illness Pt is a 73 yo female presenting to [facility] on 11/16/23 for chest pain, weakness, and confusion. CT head revealed infarct in the inferior right cerebellum. MRI brain with acute right inferomedial cerebellar ischemic infarction in the right PICA territory. Pt also diagnosed with Covid/RSV. PEA arrest 1/30. New L cerebellar infarct found on MRI 2/5. Extubated 2/9. 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 Pt lethargic throughout session, does not respond to cold, wet cloth on face, light sternal rub, will briefly open eyes when therapists' assist pt with change in position but does not visually focus on objects/people in room. Pt requires total assist +2 for supine<>sit & as much as total assist for sitting EOB as pt with absent righting reactions with posterior LOB. Will continue PT efforts to progress mobility as able.   No cervical collar in room. Pt with neck significantly flexed, attempted to assist pt into neutral cervical alignment. Messaged neurosurgery PA & asked them to place cervical collar order (as written in neurosurgery's note).   If plan is discharge home, recommend the following: Two people to help with walking and/or transfers;Two people to help with bathing/dressing/bathroom;Assistance with cooking/housework;Direct supervision/assist for medications management;Help with stairs or ramp for entrance   Can travel by private vehicle     No  Equipment Recommendations  Hoyer lift;Hospital bed    Recommendations for Other Services       Precautions / Restrictions Precautions Precautions: Fall Recall of Precautions/Restrictions: Impaired Precaution/Restrictions Comments: Monitor  O2/RR, cortrak, L hemi Required Braces or Orthoses: Other Brace Other Brace: RLE prosthetic in room, per neuro note 3/4 -- cervical collar when OOB, but not present in room 3/5 Restrictions Weight Bearing Restrictions Per Provider Order: No Other Position/Activity Restrictions: R BKA- prosthetic in room     Mobility  Bed Mobility Overal bed mobility: Needs Assistance Bed Mobility: Supine to Sit, Sit to Supine     Supine to sit: Total assist, +2 for physical assistance, +2 for safety/equipment, HOB elevated, Used rails Sit to supine: Total assist, +2 for physical assistance, HOB elevated, +2 for safety/equipment, Used rails        Transfers                        Ambulation/Gait                   Stairs             Wheelchair Mobility     Tilt Bed    Modified Rankin (Stroke Patients Only)       Balance Overall balance assessment: Needs assistance Sitting-balance support: Feet supported Sitting balance-Leahy Scale: Zero Sitting balance - Comments: min assist increase to total assist 2/2 posterior lean, absent righting reactions                                    Communication Communication Communication: Impaired Factors Affecting Communication: Difficulty expressing self;Hearing impaired  Cognition Arousal: Obtunded Behavior During Therapy: Flat affect   PT - Cognitive impairments: Difficult to assess  Following commands: Impaired Following commands impaired:  (does not follow commands)    Cueing Cueing Techniques: Verbal cues, Gestural cues, Tactile cues, Visual cues  Exercises      General Comments General comments (skin integrity, edema, etc.): VSS, spouse present      Pertinent Vitals/Pain Pain Assessment Pain Assessment: PAINAD Breathing: normal Negative Vocalization: none Facial Expression: smiling or inexpressive Body Language: relaxed Consolability: no need to  console PAINAD Score: 0    Home Living                          Prior Function            PT Goals (current goals can now be found in the care plan section) Acute Rehab PT Goals Patient Stated Goal: return to rehab to improve mobility PT Goal Formulation: Patient unable to participate in goal setting Time For Goal Achievement: 12/29/23 Potential to Achieve Goals: Poor Progress towards PT goals: Not progressing toward goals - comment (2/2 lethargy)    Frequency    Min 1X/week      PT Plan      Co-evaluation PT/OT/SLP Co-Evaluation/Treatment: Yes Reason for Co-Treatment: Complexity of the patient's impairments (multi-system involvement);Necessary to address cognition/behavior during functional activity;For patient/therapist safety PT goals addressed during session: Mobility/safety with mobility;Balance OT goals addressed during session: ADL's and self-care;Strengthening/ROM      AM-PAC PT "6 Clicks" Mobility   Outcome Measure  Help needed turning from your back to your side while in a flat bed without using bedrails?: Total Help needed moving from lying on your back to sitting on the side of a flat bed without using bedrails?: Total Help needed moving to and from a bed to a chair (including a wheelchair)?: Total Help needed standing up from a chair using your arms (e.g., wheelchair or bedside chair)?: Total Help needed to walk in hospital room?: Total Help needed climbing 3-5 steps with a railing? : Total 6 Click Score: 6    End of Session Equipment Utilized During Treatment: Oxygen Activity Tolerance: Patient limited by lethargy Patient left: in bed;with call bell/phone within reach;with bed alarm set;with family/visitor present   PT Visit Diagnosis: Muscle weakness (generalized) (M62.81);Difficulty in walking, not elsewhere classified (R26.2);Other symptoms and signs involving the nervous system (R29.898);Hemiplegia and hemiparesis Hemiplegia -  Right/Left: Left Hemiplegia - caused by: Cerebral infarction     Time: 1610-9604 PT Time Calculation (min) (ACUTE ONLY): 24 min  Charges:    $Therapeutic Activity: 8-22 mins PT General Charges $$ ACUTE PT VISIT: 1 Visit                     Aleda Grana, PT, DPT 12/27/23, 10:12 AM   Sandi Mariscal 12/27/2023, 10:11 AM

## 2023-12-27 NOTE — Progress Notes (Addendum)
 Speech Language Pathology Treatment: Dysphagia  Patient Details Name: Carolyn Russo MRN: [id number] DOB: 1950-12-01 Today's Date: 12/27/2023 Time: 4098-1191 SLP Time Calculation (min) (ACUTE ONLY): 20 min  Assessment / Plan / Recommendation Clinical Impression  Pt now NPO- see progress note from 3/4. SLP discussed with husband yesterday for him to be present to observe swallow intervention today and husband in room on SLP arrival. OT relayed to therapist that pt was sleepy and did not follow commands during their session earlier. Pt  encountered in bed sitting upright eyes closed with decreased responsiveness even to painful stimuli and would briefly open eyes. Lethargy possibly due to having fever this morning and hemoglobin is low (receiving blood). Pt coughing and suspicion of possible aspiration of secretions. SLP provided oral care with pt intermittently biting on [place]. Attempted pharyngeal exercises and she was able to swallow x 1 on command however unable to produce additional swallows and no attempts to clear throat or cough on command (reflexive coughing throughout session). EMST (expiratory muscle strength training) attempted with SLP manually closing lips around mouthpiece without pt attempt to blow/exhale due to decreased responsiveness.  SLP educated pt on intervention/exercises he can try when she is awake including asking pt to produce swallow on command, throat clear and/or cough, Masako maneuver and continue use of EMST device. Husband voiced understanding and discussed husband returning around same time when SLP attempts session Friday. She did mouth "bye" when requested by husband as he was leaving.    HPI HPI: Pt is a 73 y.o. female with who [facility] for chest pain, weakness and confusion. CT head revealed infarct in inferior right cerebellum which was new since 06/12/22. MRI brain with acute right inferomedial cerebellar ischemic infarction in the right PICA  territory. Pt also diagnosed with COVID/RSV. On 2/15  transferred back to ICU for atrial fibrillation with rapid ventricular response that developed after a gross aspiration event from vomiting. MBS 1/28 DIGEST swallow severity rating of 1, mild. Recommended dys2/thin liquids. Secondary swallows needed to clear pharyngeal residue. Experienced cardiac arrest on 1/30 and was intubated until 2/9 (ten days) and remained NPO thereafter with cortrak. PMH: CVA in 2022 and 2023, right BKA, HTN, DM-2, HLD, ESRD on HD MWF, asthma, dysphagia, CHF, heart murmur, seizure. Repeat instrumental assessment with MBS.      SLP Plan  Continue with current plan of care      Recommendations for follow up therapy are one component of a multi-disciplinary discharge planning process, led by the attending physician.  Recommendations may be updated based on patient status, additional functional criteria and insurance authorization.    Recommendations  Diet recommendations: NPO Medication Administration: Via alternative means                  Oral care QID   Frequent or constant Supervision/Assistance Dysphagia, unspecified (R13.10)     Continue with current plan of care     Royce Macadamia  12/27/2023, 11:44 AM

## 2023-12-27 NOTE — Progress Notes (Signed)
 PROGRESS NOTE                                                                                                                                                                                                             Patient Demographics:    Carolyn Russo, is a 73 y.o. female, DOB - [birth year], WJX:[id number]  Outpatient Primary MD for the patient is Annita Brod, MD    LOS - 41  Admit date - 11/16/2023    Chief Complaint  Patient presents with   Chest Pain   Dialysis pt       Brief Narrative (HPI from H&P)   73 yo female with ESRD on HD MWF, CHF, previous CVA with residual left sided weakness, T2DM, right BKA, and seizure disorder. She presented to [facility] on 1/23 with confusion, left sided chest pain, shortness of breath, and coughing. She was found to be hypoxic on room air and placed on 2 L [place]. CT chest was done without evidence of PE.  MRI revealed a 1.3cm acute to early subacute right PICA infarct. Also was admitted with pneumonia due to COVID and RSV and was started on antibiotics for likely superimposed bacterial infection.    On 1/30 early morning the patient was ~2 hrs into dialysis when her blood pressure dropped. UF goal was decreased but since the patient's level of consciousnesses decreased, she received 300 cc NS bolus. EKG changes were noted and rapid response was activated, dialysis stopped, and blood returned to the patient. Around 6:30 AM the patient was noted to be apneic without a pulse and code blue was called. Patient was noted to have PEA arrest. CPR was done for a few minutes before ROSC was achieved. She was transferred to 97M and patient was intubated due to failure to protect airway.  She continued to be tenuous and minimally responsive, finally she was extubated on 12/03/2023 transferred to my care on 12/09/2023, early in the morning she was fairly stable however within a few hours she had an episode of  emesis witnessed by her husband subsequently developed A-fib RVR and respiratory distress, cardiology PCCM again called bedside and she was transferred to ICU, she stayed there for another 5 days stabilized however still dependent on NG tube feeds, was seen by palliative care.  She was transferred to my service on 12/16/2023 again.  Overall condition report is extremely tenuous, still very high risk for aspiration, still overall extremely weak and deconditioned, family for now wants to pursue all modalities of treatment.  She is currently on NG tube, wanted to hold off PEG tube placement as it requires holding of DAPT to 5 days and increases her risk of ischemic stroke, she was seen by speech therapy placed on oral diet, core track was removed on 12/23/2023 but subsequently she aspirated again requiring core track replacement on 12/25/2023.  She now has worsening leukocytosis and evidence of active C. difficile infection as well, ID, PCCM, renal involved.  Also repeat CT scan of C-spine on 12/26/2023 shows suspicion for discitis or osteomyelitis, neurosurgery will opine as well.  Again long-term prognosis does not appear good, multiple medical problems with extremely poor baseline.   Significant Hospital Events & Prcedures:    1/23 admit to [facility] for acute infarct + covid/rsv pneumonia 1/30 admit to ICU after patient had PEA arrest  2/1 Failed SBT due to apnea. ABG with respiratory alkalosis. RR decreased 2/2 Tolerating SBT. Remains encephalopathic 2/5 Continues to tolerate SBT during day, but remains encephalopathic/unable to protect airway if extubated  2/9 extubated, off precedex 2/12 still lethargic, ABG 2/11 unremarkable, limit centrally acting meds 2/15 transferred to my care, still somnolent on NG tube feeds, aspirated her vomitus, again in respiratory distress, considered back to ICU 12/14/2023 transferred by from ICU, transferred to my service on 12/16/2023 12/19/2023.  Modified barium swallow.  Started  on dysphagia 1 diet with honey thick liquids 12/23/2023.  NG tube discontinued 12/24/2023.  CT chest.  Suspicious for worsening pneumonia 12/25/2023, will reinsert core track/NG tube n.p.o. by mouth 12/26/2023 CT C-spine - 1. High-grade height loss of the anterior inferior left greater than right aspect of the C6 vertebral body. This is entirely new from 05/05/2021 CT. Irregularity of the C6-7 endplates suspicious for discitis/osteomyelitis in the appropriate clinical setting.  12/26/2023 C. difficile positive 12/27/2023 1 unit PRBC transfusion   Unfortunately overall patient continues to appear quite weak and frail, sleepy NG tube feeding dependent, very weak cough reflex, very high risk for recurrent aspiration of oral secretions and regurgitated food material, overall still bedbound, overall prognosis appears poor explained has been relayed by previous MD to husband, I have discussed with husband at bedside as well today.      Subjective:   With fever 101.5 overnight, no further diarrhea, Flexi-Seal bag appears empty, she is altered unable to provide any reliable complaints.     Assessment  & Plan :    PEA arrest:  -due to hypotension during HD, not primarily cardiac in nature, intubated for airway protection and stabilized in ICU, seen by cardiology initially, now stable from cardiac arrest standpoint.   Acute encephalopathy:  -secondary to post-arrest, stroke, metabolic derangements, and drug induced. Concern for cefepime toxicity given her hx of ESRD, med was discontinued 2/4. MRI brain did show a new and acute central left cerebellar infarct.  Appreciate neurology recs.  Stroke not felt to be significant enough to explain altered mental status, Seroquel and amitriptyline have been stopped -Patient appears to be significantly altered, appears to be having poor mentation at baseline.   Multiple episodes of aspiration pneumonia, aspiration on 12/09/2023 followed by acute hypoxic respiratory failure  with respiratory distress on core track tube feed, went back to ICU, commenced on dysphagia 1 diet on 12/23/2023 but now evidence of aspiration pneumonia again on dysphagia 1 diet.  Has been seen by PCCM  team, previously treated with antibiotics and aspiration pneumonia had improved, unfortunately due to decreased mental status and generalized weakness she remains high risk for aspiration of oral contents or regurgitating gastric contents, she was due for PEG tube placement however family decided to hold off on PEG tube as they did not want patient to be off of antiplatelets for 5 days which increases her risk of stroke.  And she aspirated dysphagia 1 diet again on 12/24/2023, oral diet has been stopped and NG tube has been replaced, of note patient has previously aspirated on NG tube feeds as well.  Beach therapy following, PCCM following, long-term prognosis poor, she has now developed C. difficile colitis also due to multiple courses of antibiotics during her hospital stay, kindly see my previous note.  ID on board plan is to give total 4 days of antibiotics for pneumonia to reduce the chances of C. difficile worsening.  Try to keep maximum tube feed rate at 35 cc an hour.  She remains high risk for aspiration, even on her own secretions.  ID and PCCM will be consulted as well.  Prognosis again remains very poor.   C. difficile infection.  C. difficile came back + 12/26/2023, ID on board Case discussed with ID, per ID total 3 days of antibiotics for pneumonia, will be transition to Rocephin and Flagyl, she has had multiple antibiotic courses in the past for various aspiration pneumonias and sinusitis infections, oral vancomycin for C. difficile infection has been started on 12/26/2023.  Input greatly appreciated, plan for 14 days treatment.   Abnormal C6 signal  - MRI showed a nonspecific bone marrow signal of c6 vertebral body - unclear if related to a malignancy vs discitis/osteomyelitis.   -Findings of  unclear significance on imaging, neurosurgery input appreciated, she is not a candidate for any surgical intervention . -She is C-diff  positive, carries high risk for prolonged antibiotic treatment, continue to monitor off IV antibiotics for now, treat C. difficile and this can be monitored clinically as discussed with ID, with serial CRPs and ESR.    Leukocytosis Sinusitis - MRI brain noted progressed mastoid effusions, she has finished her treatment with Augmentin in ICU.  Acute to early subacute right PICA infarct on 1/24, resolved New acute central left cerebellar infarct with dysphagia, previous residual left-sided hemiparesis  - Appreciate neuro follow up , on DAPT with aspirin and brilinta along with statin, PEG tube held back per family request, currently fortunately has aspirated oral diet again on 12/24/2023.  Made her n.p.o., new core track on 12/27/2023.  Tube feeds commenced on 3 02/24/2024.  Of note patient tends to aspirate on tube feeds over 35 cc an hour.  She aspirated on tube feeds at 45 cc an hour via core track few weeks ago.    Paroxysmal atrial fibrillation with question of possible underlying sick sinus syndrome.  Seen by cardiology.  Currently on oral amiodarone.  Long-term anticoagulation not recommended by cardiology this admission.  Needs 30-day event monitor upon discharge.  Hypertension  - Currently on hydralazine via tube.   Seizure disorder  - Continue home keppra 500 mg BID seen by neurology this admission.   ESRD on HD MWF - Dialysis per nephro - Discontinued renvela due to low phos levels   Anemia of chronic disease -She required multiple units transfusion during hospital stay  -Globin low at 6.3 yesterday, transfused 1 unit of overnight, remains low at 6.9 this morning, will give another unit today. -IV iron and  Aranesp per renal   Unstageable sacral decubitus ulcer.  Kindly see picture placed in the chart on multiple dates going back 12/02/23, wound care team  following, evaluated by general surgery 12/25/2023, no intervention suggested by general surgery, no infection noted.  Continue wound care discussed with ostomy nurses as well.  Clearly explained to the family that sacral decubitus ulcer due to bedbound state usually is gradually progressive.  Continue Flexi-Seal to protect the skin as much as possible and frequent turning along with wound care.  DM. type II - Continue long-acting insulin along with ISS, dose adjusted due to change in diet on 12/22/2023  CBG (last 3)  Recent Labs    12/26/23 2308 12/27/23 0448 12/27/23 1018  GLUCAP 174* 216* 195*         Condition - Extremely Guarded  Family Communication  :    D/W Husband at bedside   Code Status :  Full  Consults  :  PCCM, Cards, Pall Care, Renal, ID, IR, general surgery, N.Surgery  PUD Prophylaxis : PPI   Procedures  :            Disposition Plan  :    Status is: Inpatient    DVT Prophylaxis  :    SCDs Start: 11/23/23 0716 heparin injection 5,000 Units Start: 11/17/23 0930     Lab Results  Component Value Date   PLT 416 (H) 12/27/2023    Diet :  Diet Order             Diet NPO time specified Except for: Sips with Meds  Diet effective now                    Inpatient Medications  Scheduled Meds:  arformoterol  15 mcg Nebulization BID   aspirin  81 mg Oral Daily   budesonide (PULMICORT) nebulizer solution  0.5 mg Nebulization BID   Chlorhexidine Gluconate Cloth  6 each Topical Q0600   Chlorhexidine Gluconate Cloth  6 each Topical Q0600   clopidogrel  75 mg Oral Daily   darbepoetin (ARANESP) injection - DIALYSIS  200 mcg Subcutaneous Q Mon-1800   feeding supplement (PROSource TF20)  60 mL Per Tube Daily   ferrous sulfate  300 mg Oral Daily   fiber supplement (BANATROL TF)  60 mL Oral BID   heparin injection (subcutaneous)  5,000 Units Subcutaneous Q8H   hydrALAZINE  50 mg Oral Q8H   insulin aspart  0-9 Units Subcutaneous Q6H    leptospermum manuka honey  1 Application Topical Daily   levETIRAcetam  500 mg Oral BID   melatonin  3 mg Oral QHS   multivitamin  1 tablet Oral QHS   nutrition supplement (JUVEN)  1 packet Per Tube BID BM   mouth rinse  15 mL Mouth Rinse Q4H   rosuvastatin  10 mg Oral Daily   sodium chloride flush  3 mL Intravenous Q12H   vancomycin  125 mg Oral Q6H   Continuous Infusions:  feeding supplement (NEPRO CARB STEADY) 1,000 mL (12/25/23 1550)   metronidazole 500 mg (12/27/23 1028)   PRN Meds:.acetaminophen **OR** acetaminophen, docusate, ipratropium-albuterol, labetalol, ondansetron (ZOFRAN) IV, mouth rinse, polyethylene glycol, Racepinephrine HCl, white petrolatum, witch hazel-glycerin    Objective:   Vitals:   12/27/23 1015 12/27/23 1024 12/27/23 1045 12/27/23 1100  BP: (!) 120/46  (!) 127/46 (!) 132/50  Pulse: 86  88 89  Resp: (!) 22  (!) 22 (!) 22  Temp: (!) 100.7 F (38.2 C)  99.2 F (37.3 C) 99.1 F (37.3 C)  TempSrc: Axillary  Axillary Axillary  SpO2: 100% 100% 97% 97%  Weight:      Height:        Wt Readings from Last 3 Encounters:  12/27/23 75.1 kg  11/01/23 79.5 kg  08/24/23 83 kg     Intake/Output Summary (Last 24 hours) at 12/27/2023 1146 Last data filed at 12/27/2023 1100 Gross per 24 hour  Intake 290 ml  Output 400 ml  Net -110 ml    Physical Exam  Somnolent, frail, deconditioned does not follow any commands or answer any questions R BKA, NG tube Symmetrical Chest wall movement, Good air movement bilaterally, CTAB RRR,No Gallops,Rubs or new Murmurs, No Parasternal Heave +ve B.Sounds, Abd Soft, No tenderness, No rebound - guarding or rigidity. No Cyanosis, Clubbing or edema, Right BKA     RN pressure injury documentation: Pressure Injury 12/02/23 Sacrum Medial Stage 2 -  Partial thickness loss of dermis presenting as a shallow open injury with a red, pink wound bed without slough. (Active)  12/02/23 1402  Location: Sacrum  Location Orientation:  Medial  Staging: Stage 2 -  Partial thickness loss of dermis presenting as a shallow open injury with a red, pink wound bed without slough.  Wound Description (Comments):   Present on Admission: No  Dressing Type Foam - Lift dressing to assess site every shift 12/27/23 1100      Data Review:    Recent Labs  Lab 12/23/23 0556 12/24/23 0550 12/25/23 0526 12/26/23 0852 12/26/23 1625 12/27/23 0519  WBC 17.0* 23.8* 29.0* 32.0* 25.0* 25.2*  HGB 7.9* 7.4* 7.4* 6.3* 7.5* 6.9*  HCT 23.5* 22.1* 21.9* 18.3* 21.5* 20.1*  PLT 518* 552* 577* 492* 387 416*  MCV 92.9 92.9 93.6 92.0 89.6 89.7  MCH 31.2 31.1 31.6 31.7 31.3 30.8  MCHC 33.6 33.5 33.8 34.4 34.9 34.3  RDW 17.2* 16.9* 16.7* 16.5* 16.5* 16.9*  LYMPHSABS 2.9 3.7 3.9 3.3  --  4.0  MONOABS 2.1* 2.6* 2.6* 2.4*  --  2.0*  EOSABS 0.2 0.1 0.2 0.1  --  0.5  BASOSABS 0.1 0.1 0.1 0.1  --  0.5*    Recent Labs  Lab 12/22/23 0552 12/23/23 0556 12/24/23 0550 12/25/23 0526 12/25/23 1134 12/26/23 0852 12/26/23 1425 12/27/23 0519  NA 133*  --  136  --  132*  --   --   --   K 4.5  --  4.4  --  4.5  --   --   --   CL 94*  --  92*  --  91*  --   --   --   CO2 23  --  25  --  22  --   --   --   ANIONGAP 16*  --  19*  --  19*  --   --   --   GLUCOSE 103*  --  202*  --  166*  --   --   --   BUN 94*  --  81*  --  106*  --   --   --   CREATININE 5.14*  --  4.59*  --  6.37*  --   --   --   ALBUMIN 2.3*  --  2.3*  --  2.4*  --   --   --   CRP 7.5* 8.8* 12.8* 14.2*  --   --  18.3* 15.6*  PROCALCITON 2.28 1.65 1.82 1.81  --  2.87  --  2.98  PHOS 3.5  --  4.0  --  4.3  --   --   --   CALCIUM 9.3  --  10.1  --  9.3  --   --   --       Recent Labs  Lab 12/22/23 0552 12/23/23 0556 12/24/23 0550 12/25/23 0526 12/25/23 1134 12/26/23 0852 12/26/23 1425 12/27/23 0519  CRP 7.5* 8.8* 12.8* 14.2*  --   --  18.3* 15.6*  PROCALCITON 2.28 1.65 1.82 1.81  --  2.87  --  2.98  CALCIUM 9.3  --  10.1  --  9.3  --   --   --      --------------------------------------------------------------------------------------------------------------- Lab Results  Component Value Date   CHOL 90 11/17/2023   HDL 38 (L) 11/17/2023   LDLCALC 37 11/17/2023   TRIG 86 12/02/2023   CHOLHDL 2.4 11/17/2023    Lab Results  Component Value Date   HGBA1C 5.8 (H) 10/26/2023    Micro Results Recent Results (from the past 240 hours)  C Difficile Quick Screen (NO PCR Reflex)     Status: Abnormal   Collection Time: 12/26/23  6:02 AM   Specimen: STOOL  Result Value Ref Range Status   C Diff antigen POSITIVE (A) NEGATIVE Final    Comment: CRITICAL RESULT CALLED TO, READ BACK BY AND VERIFIED WITH: D GRIFFIN,RN@0720  12/26/23 MK    C Diff toxin POSITIVE (A) NEGATIVE Final   C Diff interpretation Toxin producing C. difficile detected.  Final    Comment: Performed at [facility], [address], [place], [place] [address]    Radiology Report DG Chest Port 1 View Result Date: 12/26/2023 CLINICAL DATA:  [id number] with shortness of breath. EXAM: PORTABLE CHEST 1 VIEW COMPARISON:  Chest CT without contrast 12/24/2023 at 12:13 p.m. FINDINGS: 6:21 a.m. Feeding tube extends well into the stomach but the radiopaque tip is not in the film. Right IJ dialysis catheter again terminates in the right atrium. There is stable cardiomegaly, mild central vascular fullness without edema. Similar appearance of airspace disease in the lung bases, minimal pleural effusions. Remaining lungs clear. Stable mediastinum with aortic atherosclerosis. No new osseous findings. Moderate thoracic dextroscoliosis. IMPRESSION: 1. Similar appearance of airspace disease in the lung bases, minimal pleural effusions. 2. Stable cardiomegaly and mild central vascular fullness without edema. 3. Aortic atherosclerosis. 4. Support apparatus as above. Electronically Signed   By: Almira Bar M.D.   On: 12/26/2023 07:14   CT CERVICAL SPINE WO CONTRAST Result Date:  12/25/2023 CLINICAL DATA:  Cervical radiculopathy.  Infection suspected. EXAM: CT CERVICAL SPINE WITHOUT CONTRAST TECHNIQUE: Multidetector CT imaging of the cervical spine was performed without intravenous contrast. Multiplanar CT image reconstructions were also generated. RADIATION DOSE REDUCTION: This exam was performed according to the departmental dose-optimization program which includes automated exposure control, adjustment of the mA and/or kV according to patient size and/or use of iterative reconstruction technique. COMPARISON:  CT chest, abdomen, and pelvis 10/24/2023, CT chest 11/16/2023 CTA neck 05/05/2021 FINDINGS: Alignment: The atlantodens interval is intact. There is 5 mm combined retrolisthesis/retropulsion of the posteroinferior aspect of the C6 vertebral body at the C6-7 disc level (sagittal series 11, image 34). Skull base and vertebrae: There is high-grade height loss of the anterior inferior left greater than right aspect of the C6 vertebral body. This height loss is entirely new from 05/05/2021 CT. This is mildly worsened from 11/16/2023 and more definitively worsened from 10/24/2023 CTs. There is moderate concave scalloping of  the mid to anterior aspect of the C6 inferior endplate. There is irregularity of the C6-7 endplates suspicious for discitis/osteomyelitis in the appropriate clinical setting. Mild sclerosis around this endplate irregularity. Moderate to severe T1-T2 disc space narrowing. No acute fracture is seen. There is opacification of the visualized portion of the left mastoid air cells. There is normal variant incomplete closure of the posterior arch of C1. Soft tissues and spinal canal: No prevertebral fluid or swelling. No visible canal hematoma. Nasogastric tube is partially visualized. No paravertebral collection is seen to indicate an abscess. Disc levels: C2-3: Mild bilateral facet joint hypertrophy. Mild right-greater-than-left uncovertebral hypertrophy. Mild  right-greater-than-left posterior disc bulge. Mild right lateral recess narrowing. Very mild right neuroforaminal narrowing without true stenosis. No central canal stenosis. C3-4: Mild-to-moderate bilateral facet joint hypertrophy. Mild bilateral uncovertebral hypertrophy. Mild posterior disc bulge. Mild right neuroforaminal stenosis. No central canal stenosis. C4-5: Mild-to-moderate bilateral facet joint hypertrophy. No posterior disc bulge. No central canal or neuroforaminal stenosis. C5-6: Mild-to-moderate bilateral facet joint hypertrophy. Mild-to-moderate bilateral uncovertebral hypertrophy. Mild right neuroforaminal narrowing. No central canal stenosis. C6-7: There is up to 5 mm combined retrolisthesis/retropulsion of the posteroinferior aspect of the C6 vertebral body. This contributes to mild central canal stenosis. Mild right neuroforaminal stenosis. C7-T1: Mild bilateral facet joint hypertrophy. No posterior disc bulge, central canal narrowing, or neuroforaminal stenosis. Upper chest: The lung apices are unremarkable. Other: There are dense atherosclerotic calcifications within the visualized carotid bulbs. Partial visualization of a right internal jugular approach central venous catheter. IMPRESSION: 1. High-grade height loss of the anterior inferior left greater than right aspect of the C6 vertebral body. This is entirely new from 05/05/2021 CT. Irregularity of the C6-7 endplates suspicious for discitis/osteomyelitis in the appropriate clinical setting. Recommend clinical correlation. 2. There is up to 5 mm combined retrolisthesis/retropulsion of the posteroinferior aspect of the C6 vertebral body at the C6-7 disc level. This contributes to mild C6-7 central canal stenosis. 3. No paravertebral collection is seen to indicate an abscess. 4. Mild right C3-4, C5-6, and C6-7 neuroforaminal stenosis. Electronically Signed   By: Neita Garnet M.D.   On: 12/25/2023 17:16      Signature  -   Huey Bienenstock  M.D on 12/27/2023 at 11:46 AM   -  To page go to [email or web address]

## 2023-12-27 NOTE — Progress Notes (Addendum)
 [facility] Progress Note   Subjective:    Seen and examined at bedside. Transfused with HD  yesterday for Hgb 6.3.    Objective Vitals:   12/27/23 1015 12/27/23 1024 12/27/23 1045 12/27/23 1100  BP: (!) 120/46  (!) 127/46 (!) 132/50  Pulse: 86  88 89  Resp: (!) 22  (!) 22 (!) 22  Temp: (!) 100.7 F (38.2 C)  99.2 F (37.3 C) 99.1 F (37.3 C)  TempSrc: Axillary  Axillary Axillary  SpO2: 100% 100% 97% 97%  Weight:      Height:       Physical Exam   General elderly female chronically ill appearing; in bed in no acute distress Lungs clear, normal wob Heart S1S2 no rub Abdomen soft nontender nondistended Extremities no edema appreciated; right BKA  Psych no anxiety or agitation Neuro Does not answer questions or follow commands Access RIJ tunn catheter; right AVF with bruit and thrill     Dialysis Orders: MWF NW  4h   B350    75.5kg   2/2 bath   R fem TDC  Heparin 2600 + 1400 midrun - last HD 1/22, getting to dry wt - venofer 100 qhd thru 1/31 - mircera 150 q2, last 1/17, due 1/31  Renal-related home meds: - rocaltrol 0.5 mwf - sensipar 30 every day - hydralazine 100 tid - losartan 50 bid - renvela 800mg  ac tid - others: brilinta, crestor, keppra, ritalin, neurontin, asa, PPI, dulera, insulin, albuterol, elavil  CXR 2/18, 2/22 - small R effusion, no edema/ congestion   Assessment/Plan: ESRD - HD MWF. Off schedule this week. Next HD Thurs.  Acute hypoxic respiratory failure - multifactorial with +COVID/ +RSV pna/volume: S/P course of abx + and we are optimizing volume status with HD.  Antibiotics per primary team discretion   CVA -  R then L cerebellar infarcts d/t small vessel disease.On aspirin and plavix for now  Encephalopathy - multifactorial. Post-arrest encephalopathy.  Aspiration pneumonitis -   NGT in place. Nutrition per primary team  HTN - controlled.  Follow for need to further reduce hydralazine Anemia - s/p 2 u prbcs on 3/4. Hgb 6.3 >  7.5>6.9.  Aranesp 200 mcg every Monday.  Tsat 26% and ferritin 1638.    Transfuse prn  Secondary hyperparathyroidism -  phos ok on current regimen. C. diff infection - on oral vancomycin  s/p PEA arrest - noted recent complicated history  Seizure disorder - on Keppra  DM - insulin per primary  GOC - poor prognosis in general. Pt was having significant difficulties w/ outpatient dialysis prior to this admission, including AMS and hypotension on dialysis.   Disposition - per primary team   Recent Labs  Lab 12/24/23 0550 12/25/23 0526 12/25/23 1134 12/26/23 0852 12/26/23 1625 12/27/23 0519  HGB 7.4*   < >  --    < > 7.5* 6.9*  ALBUMIN 2.3*  --  2.4*  --   --   --   CALCIUM 10.1  --  9.3  --   --   --   PHOS 4.0  --  4.3  --   --   --   CREATININE 4.59*  --  6.37*  --   --   --   K 4.4  --  4.5  --   --   --    < > = values in this interval not displayed.    Inpatient medications:  arformoterol  15 mcg Nebulization BID  aspirin  81 mg Oral Daily   budesonide (PULMICORT) nebulizer solution  0.5 mg Nebulization BID   Chlorhexidine Gluconate Cloth  6 each Topical Q0600   Chlorhexidine Gluconate Cloth  6 each Topical Q0600   clopidogrel  75 mg Oral Daily   darbepoetin (ARANESP) injection - DIALYSIS  200 mcg Subcutaneous Q Mon-1800   feeding supplement (PROSource TF20)  60 mL Per Tube Daily   ferrous sulfate  300 mg Oral Daily   fiber supplement (BANATROL TF)  60 mL Oral BID   heparin injection (subcutaneous)  5,000 Units Subcutaneous Q8H   hydrALAZINE  50 mg Oral Q8H   insulin aspart  0-9 Units Subcutaneous Q6H   leptospermum manuka honey  1 Application Topical Daily   levETIRAcetam  500 mg Oral BID   melatonin  3 mg Oral QHS   multivitamin  1 tablet Oral QHS   nutrition supplement (JUVEN)  1 packet Per Tube BID BM   mouth rinse  15 mL Mouth Rinse Q4H   rosuvastatin  10 mg Oral Daily   sodium chloride flush  3 mL Intravenous Q12H   vancomycin  125 mg Oral Q6H    feeding  supplement (NEPRO CARB STEADY) 1,000 mL (12/25/23 1550)   metronidazole 500 mg (12/27/23 1028)   acetaminophen **OR** acetaminophen, docusate, ipratropium-albuterol, labetalol, ondansetron (ZOFRAN) IV, mouth rinse, polyethylene glycol, Racepinephrine HCl, white petrolatum, witch hazel-glycerin   Tomasa Blase PA-C [facility] 12/27/2023,11:32 AM

## 2023-12-27 NOTE — Progress Notes (Signed)
 Id brief note   Discussed with primary team  Incidental cspine imaging finding ?chronic prior changes. In itself without biopsy/cx doesn't definitively dx om   Fever/wbc improving on cdiff treatment Presumed aspiration finishing aspiration abx course   In this patient, the most reasonable approach would be to avoid systemic abx --> finish aspiration pneumonia treatment today  Finish 2 weeks of cdiff treatment   If fever resolved and wbc resolved we can plan serial (weekly crp/esr) and repeat the neck ct in around 2-3 weeks  If worsening sed rate/erosive change could then consider empiric treatment for the cspine as osteomyelitis probability goes up. At that time also could ask IR to sample too to keep therapy targeted... this is all in setting not wanting to make cdiff worse/complication

## 2023-12-27 NOTE — TOC Progression Note (Signed)
 Transition of Care [facility]) - Progression Note    Patient Details  Name: Carolyn Russo MRN: [id number] Date of Birth: August 09, 1951  [facility] CM/SW Contact  Mearl Latin, LCSW Phone Number: 12/27/2023, 8:35 AM  Clinical Narrative:    CSW continuing to follow for medical stability.    Expected Discharge Plan: Skilled Nursing Facility Barriers to Discharge: Continued Medical Work up, English as a second language teacher  Expected Discharge Plan and Services In-house Referral: Clinical Social Work   Post Acute Care Choice: Skilled Nursing Facility Living arrangements for the past 2 months: Single Family Home, Skilled Nursing Facility                                       Social Determinants of Health (SDOH) Interventions SDOH Screenings   Food Insecurity: No Food Insecurity (11/17/2023)  Housing: Low Risk  (11/17/2023)  Transportation Needs: No Transportation Needs (11/17/2023)  Utilities: Not At Risk (11/17/2023)  Alcohol Screen: Low Risk  (01/09/2019)  Depression (PHQ2-9): Low Risk  (09/11/2023)  Financial Resource Strain: Low Risk  (09/02/2021)  Physical Activity: Inactive (09/02/2021)  Social Connections: Socially Integrated (11/17/2023)  Stress: Stress Concern Present (09/02/2021)  Tobacco Use: Low Risk  (11/16/2023)    Readmission Risk Interventions    12/15/2023    2:57 PM 02/14/2023    4:54 PM  Readmission Risk Prevention Plan  Transportation Screening Complete Complete  PCP or Specialist Appt within 3-5 Days  Complete  HRI or Home Care Consult  Complete  Social Work Consult for Recovery Care Planning/Counseling  Complete  Palliative Care Screening  Complete  Medication Review Ocean[facility]) Complete Complete  PCP or Specialist appointment within 3-5 days of discharge Complete   HRI or Home Care Consult Complete   SW Recovery Care/Counseling Consult Complete   Palliative Care Screening Complete   Skilled Nursing Facility Complete

## 2023-12-27 NOTE — Progress Notes (Signed)
 Nutrition Follow-up  DOCUMENTATION CODES:   Not applicable  INTERVENTION:  Continue with Banatrol TID to assist with management of loose stools. Each packet contains 5g of soluble fiber. Continue with -1 packet Juven BID, each packet provides 95 calories, 2.5 grams of protein (collagen), and 9.8 grams of carbohydrate (3 grams sugar); also contains 7 grams of L-arginine and L-glutamine, 300 mg vitamin C, 15 mg vitamin E, 1.2 mcg vitamin B-12, 9.5 mg zinc, 200 mg calcium, and 1.5 g  Calcium Beta-hydroxy-Beta-methylbutyrate to support wound healing Multivitamin/minerals Initiate tube feeding via cortrak: Increase Nepro to 74ml/h (960 ml per day) Initiate feeding at 40ml/h increase rate by 10ml every 12 hrs till goal rate is met. (Addressed MD in rounds and he deferred recommendations)    Prosource TF20 60 ml daily   Provides 1808 kcal, 98 gm protein, 698 ml free water daily     NUTRITION DIAGNOSIS:   Inadequate oral intake related to acute illness as evidenced by NPO status.    GOAL:   Patient will meet greater than or equal to 90% of their needs    MONITOR:   TF tolerance, I & O's, Labs, Weight trends  REASON FOR ASSESSMENT:   Ventilator    ASSESSMENT:   Pt  presented 1/23 with confusion and worsening L chest pain secondary to COVID and RSV infection. PMH significant for ESRD on HD, CHF, CVA with residual L sided weakness, T2DM, R BKA, seizure disorder. Patient discussed in rounds. MD request no changes to Tube feed rate and addition of flushes.  Due to patients current status and decline.   Currently EN dependent related to aspiration risks.  EN: Nepro @ 82ml/hr, banatrol TID, Prosource TF 60ml daily,    1/23 admit to [facility] for acute infarct + covid/rsv pneumonia 1/30 admit to ICU after patient had PEA arrest  2/1 Failed SBT due to apnea. ABG with respiratory alkalosis. RR decreased 2/2 Tolerating SBT. Remains encephalopathic 2/5 Continues to tolerate SBT during day,  but remains encephalopathic/unable to protect airway if extubated  2/9 extubated, off precedex 2/12 still lethargic, ABG 2/11 unremarkable, limit centrally acting meds 2/15 transferred to my care, still somnolent on NG tube feeds, aspirated her vomitus, again in respiratory distress, considered back to ICU 12/14/2023 transferred by from ICU, transferred to my service on 12/16/2023 12/19/2023.  Modified barium swallow.  Started on dysphagia 1 diet with honey thick liquids 12/23/2023.  NG tube discontinued 12/24/2023.  CT chest.  Suspicious for worsening pneumonia 12/25/2023, will reinsert core track/NG tube n.p.o. by mouth 12/26/2023 CT C-spine - 1. High-grade height loss of the anterior inferior left greater than right aspect of the C6 vertebral body. This is entirely new from 05/05/2021 CT. Irregularity of the C6-7 endplates suspicious for discitis/osteomyelitis in the appropriate clinical setting.  12/26/2023 C. difficile positive  NUTRITION - FOCUSED PHYSICAL EXAM:  Flowsheet Row Most Recent Value  Orbital Region Mild depletion  Upper Arm Region Mild depletion  Thoracic and Lumbar Region No depletion  Buccal Region No depletion  Temple Region No depletion  Clavicle Bone Region No depletion  Clavicle and Acromion Bone Region No depletion  Scapular Bone Region Unable to assess  Dorsal Hand Unable to assess  Patellar Region Moderate depletion  Anterior Thigh Region Moderate depletion  Posterior Calf Region Moderate depletion  Edema (RD Assessment) Mild  Hair Reviewed  Eyes Reviewed  Mouth Reviewed  Skin Reviewed  Nails Reviewed       Diet Order:   Diet Order  Diet NPO time specified Except for: Sips with Meds  Diet effective now                   EDUCATION NEEDS:   No education needs have been identified at this time  Skin:  Skin Assessment: Reviewed RN Assessment Skin Integrity Issues:: Stage II Stage II: medial sacrum  Last BM:  12/23/23  Height:   Ht  Readings from Last 1 Encounters:  11/28/23 5\' 3"  (1.6 m)    Weight:   Wt Readings from Last 1 Encounters:  12/27/23 75.1 kg    Ideal Body Weight:  52.3 kg  BMI:  Body mass index is 29.33 kg/m.  Estimated Nutritional Needs:   Kcal:  1600-1800 kcal/d  Protein:  80-100 g/d  Fluid:  1L + UOP    Jamelle Haring RDN, LDN Clinical Dietitian   If unable to reach, please contact "RD Inpatient" secure chat group between 8 am-4 pm daily"

## 2023-12-27 NOTE — Progress Notes (Signed)
 Occupational Therapy Treatment Patient Details Name: Carolyn Russo MRN: [id number] DOB: 30-Mar-1951 Today's Date: 12/27/2023   History of present illness Pt is a 73 yo female presenting to [facility] on 11/16/23 for chest pain, weakness, and confusion. CT head revealed infarct in the inferior right cerebellum. MRI brain with acute right inferomedial cerebellar ischemic infarction in the right PICA territory. Pt also diagnosed with Covid/RSV. PEA arrest 1/30. New L cerebellar infarct found on MRI 2/5. Extubated 2/9. PMH: R BKA on 7/26, DM2, HTN, HLD, ESRD on HD MWF, hx of CVA 03/2021 & 05/2022, asthma, CHF, dysphagia, heart murmur, seizure   OT comments  Pt not progressing toward goals this session, needing total A +2 for EOB mobility. Pt with no command follow this session, briefly opening eyes with movement and with coughing/suctioning. Pt with trunk/cervical flexion while sitting EOB, opens eyes briefly and noted continues to have R gaze, does not look to L this session. Pt presenting with impairments listed below, will follow acutely. Patient will benefit from continued inpatient follow up therapy, <3 hours/day pending progression.       If plan is discharge home, recommend the following:  A lot of help with bathing/dressing/bathroom;Two people to help with walking and/or transfers;Assistance with cooking/housework;Direct supervision/assist for medications management;Assistance with feeding;Direct supervision/assist for financial management;Assist for transportation;Help with stairs or ramp for entrance   Equipment Recommendations  Hoyer lift;Hospital bed    Recommendations for Other Services      Precautions / Restrictions Precautions Precautions: Fall Recall of Precautions/Restrictions: Impaired Precaution/Restrictions Comments: Monitor O2/RR, cortrak, L hemi Required Braces or Orthoses: Other Brace Other Brace: RLE prosthetic in room, per neuro note 3/4 -- cervical collar when OOB, but not  present in room 3/5 Restrictions Weight Bearing Restrictions Per Provider Order: No       Mobility Bed Mobility Overal bed mobility: Needs Assistance     Sidelying to sit: Total assist, +2 for physical assistance, HOB elevated   Sit to supine: Total assist, +2 for physical assistance   General bed mobility comments: trunk and cervical flexion in sitting,  L lateral lean    Transfers                   General transfer comment: deferred due to decr command follow     Balance Overall balance assessment: Needs assistance Sitting-balance support: Single extremity supported, Feet supported Sitting balance-Leahy Scale: Zero Sitting balance - Comments: total A posterior support, unable to hold head up Postural control: Posterior lean                                 ADL either performed or assessed with clinical judgement   ADL                                         General ADL Comments: total A for all ADLs at this time    Extremity/Trunk Assessment Upper Extremity Assessment RUE Deficits / Details: no AROM this session, PROF WFL RUE Coordination: decreased gross motor;decreased fine motor LUE Deficits / Details: cleaned hand and re-donned palm protector, PROM to LUE WFL, not signs of skin irritation or breakdown noted LUE Sensation: decreased light touch LUE Coordination: decreased fine motor;decreased gross motor   Lower Extremity Assessment Lower Extremity Assessment: Defer to PT evaluation  Vision   Vision Assessment?: Vision impaired- to be further tested in functional context Additional Comments: R gaze when pt opens eyes, does not look to L   Perception Perception Perception: Impaired Preception Impairment Details: Inattention/Neglect Perception-Other Comments: L inattention   Praxis Praxis Praxis: Not tested   Communication Communication Communication: Impaired Factors Affecting Communication: Difficulty  expressing self;Hearing impaired   Cognition Arousal: Obtunded Behavior During Therapy: Flat affect Cognition: Difficult to assess Difficult to assess due to: Impaired communication           OT - Cognition Comments: no vocalizations this session, briefly opening eyes when coughing and with sitting EOB/movement                 Following commands: Impaired Following commands impaired:  (follows no commands)      Cueing   Cueing Techniques: Verbal cues, Gestural cues, Tactile cues, Visual cues  Exercises Other Exercises Other Exercises: PROM to LUE    Shoulder Instructions       General Comments VSS, spouse present    Pertinent Vitals/ Pain       Pain Assessment Pain Assessment: Faces Pain Intervention(s): Monitored during session  Home Living                                          Prior Functioning/Environment              Frequency  Min 1X/week        Progress Toward Goals  OT Goals(current goals can now be found in the care plan section)  Progress towards OT goals: Not progressing toward goals - comment (lethargy, no command follow)  Acute Rehab OT Goals Patient Stated Goal: none stated OT Goal Formulation: Patient unable to participate in goal setting Time For Goal Achievement: 01/04/24 Potential to Achieve Goals: Fair ADL Goals Pt Will Perform Grooming: with mod assist;sitting Pt Will Perform Lower Body Bathing: sitting/lateral leans;with min assist Pt Will Perform Upper Body Dressing: with min assist;sitting Pt Will Perform Lower Body Dressing: with mod assist;sitting/lateral leans Pt Will Transfer to Toilet: with min assist;with +2 assist;stand pivot transfer;bedside commode Pt/caregiver will Perform Home Exercise Program: Increased strength;Increased ROM;Both right and left upper extremity;With written HEP provided Additional ADL Goal #1: In sitting patient will be able to hold head up with mod A and mod cues for 2  minutes to engage in an activity Additional ADL Goal #2: In sitting patient will be able to use RUE actively to assist with rolling, sitting balance or scooting with moderate verbal and tactile cueing  Plan      Co-evaluation    PT/OT/SLP Co-Evaluation/Treatment: Yes Reason for Co-Treatment: For patient/therapist safety;Complexity of the patient's impairments (multi-system involvement);Necessary to address cognition/behavior during functional activity;To address functional/ADL transfers PT goals addressed during session: Mobility/safety with mobility;Balance;Strengthening/ROM OT goals addressed during session: ADL's and self-care;Strengthening/ROM      AM-PAC OT "6 Clicks" Daily Activity     Outcome Measure   Help from another person eating meals?: Total Help from another person taking care of personal grooming?: Total Help from another person toileting, which includes using toliet, bedpan, or urinal?: Total Help from another person bathing (including washing, rinsing, drying)?: Total Help from another person to put on and taking off regular upper body clothing?: Total Help from another person to put on and taking off regular lower body clothing?: Total 6 Click Score:  6    End of Session    OT Visit Diagnosis: Muscle weakness (generalized) (M62.81);Other symptoms and signs involving cognitive function;Pain;Other abnormalities of gait and mobility (R26.89);Unsteadiness on feet (R26.81);Other (comment) Hemiplegia - Right/Left: Left Hemiplegia - dominant/non-dominant: Non-Dominant Hemiplegia - caused by: Cerebral infarction   Activity Tolerance Patient limited by lethargy   Patient Left in bed;with call bell/phone within reach;with bed alarm set;with family/visitor present;with nursing/sitter in room   Nurse Communication Mobility status        Time: 6962-9528 OT Time Calculation (min): 25 min  Charges: OT General Charges $OT Visit: 1 Visit OT Treatments $Self Care/Home  Management : 8-22 mins  Carver Fila, OTD, OTR/L SecureChat Preferred Acute Rehab [phone]   Prosperity Darrough K Koonce 12/27/2023, 10:06 AM

## 2023-12-28 ENCOUNTER — Inpatient Hospital Stay (HOSPITAL_COMMUNITY): Admitting: Certified Registered Nurse Anesthetist

## 2023-12-28 DIAGNOSIS — J189 Pneumonia, unspecified organism: Secondary | ICD-10-CM | POA: Diagnosis not present

## 2023-12-28 DIAGNOSIS — I469 Cardiac arrest, cause unspecified: Secondary | ICD-10-CM

## 2023-12-28 DIAGNOSIS — N186 End stage renal disease: Secondary | ICD-10-CM | POA: Diagnosis not present

## 2023-12-28 DIAGNOSIS — M4642 Discitis, unspecified, cervical region: Secondary | ICD-10-CM | POA: Diagnosis not present

## 2023-12-28 DIAGNOSIS — Z515 Encounter for palliative care: Secondary | ICD-10-CM | POA: Diagnosis not present

## 2023-12-28 DIAGNOSIS — Z7189 Other specified counseling: Secondary | ICD-10-CM | POA: Diagnosis not present

## 2023-12-28 DIAGNOSIS — T17908D Unspecified foreign body in respiratory tract, part unspecified causing other injury, subsequent encounter: Secondary | ICD-10-CM

## 2023-12-28 DIAGNOSIS — G9340 Encephalopathy, unspecified: Secondary | ICD-10-CM | POA: Diagnosis not present

## 2023-12-28 LAB — TYPE AND SCREEN
ABO/RH(D): O POS
Antibody Screen: NEGATIVE
Unit division: 0
Unit division: 0
Unit division: 0
Unit division: 0

## 2023-12-28 LAB — BPAM RBC
Blood Product Expiration Date: 202503132359
Blood Product Expiration Date: 202503272359
Blood Product Expiration Date: 202503282359
ISSUE DATE / TIME: 202503041152
ISSUE DATE / TIME: 202503051054
ISSUE DATE / TIME: 202503061618
ISSUE DATE / TIME: 202503061618
ISSUE DATE / TIME: 202503272359
Unit Type and Rh: 202503272359
Unit Type and Rh: 202503272359
Unit Type and Rh: 5100
Unit Type and Rh: 5100
Unit Type and Rh: 5100
Unit Type and Rh: 5100

## 2023-12-28 LAB — CBC WITH DIFFERENTIAL/PLATELET
Abs Immature Granulocytes: 0.3 10*3/uL — ABNORMAL HIGH (ref 0.00–0.07)
Basophils Absolute: 0.1 10*3/uL (ref 0.0–0.1)
Basophils Relative: 0 %
Eosinophils Absolute: 0.4 10*3/uL (ref 0.0–0.5)
Eosinophils Relative: 2 %
HCT: 23.7 % — ABNORMAL LOW (ref 36.0–46.0)
Hemoglobin: 8.1 g/dL — ABNORMAL LOW (ref 12.0–15.0)
Immature Granulocytes: 1 %
Lymphocytes Relative: 15 %
Lymphs Abs: 3.7 10*3/uL (ref 0.7–4.0)
MCH: 29.8 pg (ref 26.0–34.0)
MCHC: 34.2 g/dL (ref 30.0–36.0)
MCV: 87.1 fL (ref 80.0–100.0)
Monocytes Absolute: 2.6 10*3/uL — ABNORMAL HIGH (ref 0.1–1.0)
Monocytes Relative: 10 %
Neutro Abs: 18.4 10*3/uL — ABNORMAL HIGH (ref 1.7–7.7)
Neutrophils Relative %: 72 %
Platelets: 410 10*3/uL — ABNORMAL HIGH (ref 150–400)
RBC: 2.72 MIL/uL — ABNORMAL LOW (ref 3.87–5.11)
RDW: 17.8 % — ABNORMAL HIGH (ref 11.5–15.5)
Smear Review: NORMAL
WBC: 25.5 10*3/uL — ABNORMAL HIGH (ref 4.0–10.5)
nRBC: 0 % (ref 0.0–0.2)

## 2023-12-28 LAB — GLUCOSE, CAPILLARY
Glucose-Capillary: 227 mg/dL — ABNORMAL HIGH (ref 70–99)
Glucose-Capillary: 276 mg/dL — ABNORMAL HIGH (ref 70–99)

## 2023-12-28 LAB — C-REACTIVE PROTEIN: CRP: 13.8 mg/dL — ABNORMAL HIGH (ref <1.0)

## 2023-12-28 LAB — PREPARE RBC (CROSSMATCH)

## 2023-12-28 LAB — PROCALCITONIN: Procalcitonin: 3.14 ng/mL

## 2023-12-28 MED ORDER — INSULIN GLARGINE 100 UNIT/ML ~~LOC~~ SOLN
5.0000 [IU] | Freq: Every day | SUBCUTANEOUS | Status: DC
  Administered 2023-12-28: 5 [IU] via SUBCUTANEOUS
  Filled 2023-12-28: qty 0.05

## 2023-12-28 MED ORDER — SODIUM CHLORIDE 0.9% IV SOLUTION: Freq: Once | INTRAVENOUS | Status: DC

## 2023-12-28 MED ORDER — INSULIN ASPART 100 UNIT/ML IJ SOLN
0.0000 [IU] | INTRAMUSCULAR | Status: DC
  Administered 2023-12-28: 5 [IU] via SUBCUTANEOUS
  Administered 2023-12-28: 3 [IU] via SUBCUTANEOUS

## 2023-12-29 DIAGNOSIS — U071 COVID-19: Secondary | ICD-10-CM | POA: Insufficient documentation

## 2023-12-29 DIAGNOSIS — A0472 Enterocolitis due to Clostridium difficile, not specified as recurrent: Secondary | ICD-10-CM | POA: Insufficient documentation

## 2023-12-29 DIAGNOSIS — I639 Cerebral infarction, unspecified: Secondary | ICD-10-CM | POA: Insufficient documentation

## 2023-12-29 DIAGNOSIS — M4642 Discitis, unspecified, cervical region: Secondary | ICD-10-CM | POA: Insufficient documentation

## 2023-12-29 DIAGNOSIS — J121 Respiratory syncytial virus pneumonia: Secondary | ICD-10-CM | POA: Insufficient documentation

## 2023-12-29 DIAGNOSIS — L89159 Pressure ulcer of sacral region, unspecified stage: Secondary | ICD-10-CM | POA: Insufficient documentation

## 2024-01-02 ENCOUNTER — Other Ambulatory Visit (HOSPITAL_COMMUNITY): Payer: Self-pay

## 2024-01-05 ENCOUNTER — Ambulatory Visit: Payer: Medicare PPO | Admitting: Obstetrics and Gynecology

## 2024-01-16 MED FILL — Medication: Qty: 1 | Status: AC

## 2024-01-23 NOTE — Procedures (Signed)
 Cardiopulmonary Resuscitation Note  MICIAH SHEALY  [id number]  08-Jul-1951  Date:12/27/2023  Time:4:29 PM   Provider Performing:Torey Regan E Christoper Bushey   Procedure: Cardiopulmonary Resuscitation (92950)  Indication(s) Loss of Pulse  Consent N/A  Anesthesia N/A   Time Out N/A   Sterile Technique Hand hygiene, gloves   Procedure Description Called to patient's room for CODE BLUE. Initial rhythm was PEA/Asystole. Patient received high quality chest compressions for 11 minutes with defibrillation or cardioversion when appropriate. Epinephrine was administered every 3 minutes as directed by time Biomedical engineer. Additional pharmacologic interventions included amiodarone, calcium chloride, and sodium bicarbonate. Additional procedural interventions include intra-osseus line. HD line was accessed. Return of spontaneous circulation was not achieved.  Family called and notified.   Complications/Tolerance N/A   EBL N/A   Specimen(s) N/A  Tessie Fass MSN, AGACNP-BC [facility] Amion for pager  01/05/2024, 4:30 PM

## 2024-01-23 NOTE — Progress Notes (Signed)
 Carolyn Russo Progress Note   Subjective:    Seen and examined at bedside. Remains minimally responsive. Dialysis today.   Objective Vitals:   12/27/23 2124 01/22/2024 0000 01/17/2024 0647 01/20/2024 0800  BP: (!) 141/53 (!) 131/51 (!) 126/53 (!) 129/57  Pulse: 92   84  Resp:    (!) 34  Temp: 99.5 F (37.5 C) 100.2 F (37.9 C)    TempSrc: Axillary Axillary    SpO2: 99%   97%  Weight:      Height:       Physical Exam   Chronically ill elderly woman, nad  Lungs clear, normal wob Heart S1S2 no rub Abdomen soft nontender nondistended Extremities no edema appreciated; right BKA  Psych no anxiety or agitation Neuro Does not answer questions or follow commands Access RIJ tunn catheter; right AVF with bruit and thrill     Dialysis Orders: MWF [facility]  4h   B350    75.5kg   2/2 bath   R fem TDC  Heparin 2600 + 1400 midrun - last HD 1/22, getting to dry wt - venofer 100 qhd thru 1/31 - mircera 150 q2, last 1/17, due 1/31  Renal-related home meds: - rocaltrol 0.5 mwf - sensipar 30 every day - hydralazine 100 tid - losartan 50 bid - renvela 800mg  ac tid - others: brilinta, crestor, keppra, ritalin, neurontin, asa, PPI, dulera, insulin, albuterol, elavil  CXR 2/18, 2/22 - small R effusion, no edema/ congestion   Assessment/Plan: ESRD - HD MWF. Off schedule this week. Next HD Thurs.  Acute hypoxic respiratory failure - multifactorial with +COVID/ +RSV pna/volume: S/P course of abx  and we are optimizing volume status with HD.   CVA -  R then L cerebellar infarcts d/t small vessel disease.On aspirin and plavix for now  Encephalopathy - multifactorial. Post-arrest encephalopathy.  Aspiration pneumonitis -   NGT in place. Nutrition per primary team  HTN - controlled.  Follow for need to further reduce hydralazine Anemia - s/p 2 u prbcs on 3/4. Hgb 8.1  Aranesp 200 mcg every Monday.  Tsat 26% and ferritin 1638.    Transfuse prn  Secondary hyperparathyroidism -  phos ok on  current regimen. C. diff infection - on oral vancomycin  s/p PEA arrest - noted recent complicated history  Seizure disorder - on Keppra  DM - insulin per primary  GOC - poor prognosis in general. Pt was having significant difficulties w/ outpatient dialysis prior to this admission. Family has agreed to palliative care consult.    Disposition - per primary team   Recent Labs  Lab 12/24/23 0550 12/25/23 0526 12/25/23 1134 12/26/23 0852 12/27/23 0519 01/15/2024 0535  HGB 7.4*   < >  --    < > 6.9* 8.1*  ALBUMIN 2.3*  --  2.4*  --   --   --   CALCIUM 10.1  --  9.3  --   --   --   PHOS 4.0  --  4.3  --   --   --   CREATININE 4.59*  --  6.37*  --   --   --   K 4.4  --  4.5  --   --   --    < > = values in this interval not displayed.    Inpatient medications:  arformoterol  15 mcg Nebulization BID   aspirin  81 mg Oral Daily   budesonide (PULMICORT) nebulizer solution  0.5 mg Nebulization BID   Chlorhexidine Gluconate  Cloth  6 each Topical Q0600   clopidogrel  75 mg Oral Daily   darbepoetin (ARANESP) injection - DIALYSIS  200 mcg Subcutaneous Q Mon-1800   feeding supplement (PROSource TF20)  60 mL Per Tube Daily   ferrous sulfate  300 mg Oral Daily   fiber supplement (BANATROL TF)  60 mL Oral BID   heparin injection (subcutaneous)  5,000 Units Subcutaneous Q8H   hydrALAZINE  50 mg Oral Q8H   insulin aspart  0-9 Units Subcutaneous Q4H   leptospermum manuka honey  1 Application Topical Daily   levETIRAcetam  500 mg Oral BID   melatonin  3 mg Oral QHS   multivitamin  1 tablet Oral QHS   nutrition supplement (JUVEN)  1 packet Per Tube BID BM   mouth rinse  15 mL Mouth Rinse Q4H   rosuvastatin  10 mg Oral Daily   sodium chloride flush  3 mL Intravenous Q12H   vancomycin  125 mg Oral Q6H    feeding supplement (NEPRO CARB STEADY) 1,000 mL (12/27/23 1649)   acetaminophen **OR** acetaminophen, docusate, ipratropium-albuterol, labetalol, ondansetron (ZOFRAN) IV, mouth rinse,  polyethylene glycol, Racepinephrine HCl, white petrolatum, witch hazel-glycerin   Tomasa Blase PA-C [facility] 01/22/2024,10:46 AM

## 2024-01-23 NOTE — Transfer of Care (Signed)
 Immediate Anesthesia Transfer of Care Note  Patient: Carolyn Russo  Procedure(s) Performed: AN AD HOC INTUBATION  Patient Location: Nursing [id number]  Anesthesia Type:MACcode blue intubation  Level of Consciousness: Patient remains intubated per anesthesia plan  Airway & Oxygen Therapy: Patient placed on Ventilator (see vital sign flow sheet for setting)  Post-op Assessment: Post -op Vital signs reviewed and stable  Post vital signs: unstable  Last Vitals:  Vitals Value Taken Time  BP 136/116 01/04/2024 1624  Temp 98   Pulse 142 01/19/2024 1621  Resp 24 01/17/2024 1624  SpO2 87 % 01/18/2024 1621  Vitals shown include unfiled device data.  Last Pain:  Vitals:   01/07/2024 1200  TempSrc: Axillary  PainSc:          Complications: No notable events documented.

## 2024-01-23 NOTE — Progress Notes (Signed)
 Patient has transported to morgue.

## 2024-01-23 NOTE — Significant Event (Signed)
 Rapid Response Event Note   Reason for Call :  Code Blue  ACLS protocol underway upon arrival, zoll pads and backboard in place, compressions going, BVM in use, Epi x1 already given. Large GIB noted. MD Elgergawy at bedside. Pt remained PEA. IO placed. Pt intubated. Epi, bicarb and calcium given. Levo gtt started. ROSC achieved  and pt transferred to 17M13. Per primary RN, CCMD notified of bradycardia 30s, RN immediately to bedside to find patient pulseless HR 0--Code Blue activated and compressions started.   SBP dropped from 120 to 80 en route, levo increased. Upon arrival to 17M, HD catheter accessed for push line per MD. Pulse lost 1613 (PEA), compressions started and ACLS resumed. Patient received epi, bicarb, calcium, amio and multiple shocks. Patient now bleeding from ETT. Unfortunately TOD called 1624.   Truddie Crumble, RN

## 2024-01-23 NOTE — Progress Notes (Signed)
 Notified by telemetry patient is going bradycardic, upon evaluation patient was bradycardic, and became pulseless as of 3:51 PM, CODE BLUE was called, CPR was initiated, patient regained pulse at 1416 p.m., intubated by anesthesia, transferred to ICU under PCCM care, I have called her husband and updated him. Huey Bienenstock MD

## 2024-01-23 NOTE — Progress Notes (Signed)
 Received pt from floor with rapid response, ccm, and respiratory at bedside.  After transfer to bed pt PEA at 1613 CPR started see code sheet.

## 2024-01-23 NOTE — Progress Notes (Signed)
   01/01/2024 1701  Spiritual Encounters  Type of Visit Initial  Care provided to: Pt and family  Conversation partners present during encounter Nurse;Physician  Referral source Code page  Reason for visit Code   Chaplain responded to Code Blue on 5W17, then again on 3M13.  Pt did not survive the code.  Pt's husband arrived and chaplain provided compassionate presence as they informed him that the Pt had died. Chaplain remained with husband providing comforting presence and reflective listening.  Chaplain services remain available by Spiritual Consult or for emergent cases, paging [phone]  Chaplain Raelene Bott, MDiv [email or web address] [email or web address] [phone]

## 2024-01-23 NOTE — Progress Notes (Signed)
 Late Note Entry- December 29, 2023  Contacted [facility] this morning to be advised of pt's passing.   Olivia Canter Renal Navigator [phone]

## 2024-01-23 NOTE — Death Summary Note (Signed)
 DEATH SUMMARY   Patient Details  Name: Carolyn Russo MRN: [id number] DOB: Jun 11, 1951 WUJ:[id number], [name], MD Admission/Discharge Information   Admit Date:  December 09, 2023  Date of Death: Date of Death: 2024/01/20  Time of Death: Time of Death: 24-Jan-1623  Length of Stay: 60   Principle Cause of death:   Acute CVA  Hospital Diagnoses: Principal Problem:   Acute CVA (cerebrovascular accident) [facility]) Active Problems:   CAP (community acquired pneumonia)   Acute respiratory failure (HCC)   Cardiac arrest (HCC)   Pneumonia due to COVID-19 virus   RSV (respiratory syncytial virus pneumonia)   C. difficile colitis   Cervical discitis   ESRD on dialysis (HCC)   Acute metabolic encephalopathy   Type 2 diabetes mellitus (HCC)   Anemia in chronic kidney disease   PAD (peripheral artery disease) (HCC)   Seizure disorder (HCC)   Anemia of chronic disease   Below-knee amputation of right lower extremity (HCC)   Diabetes mellitus due to underlying condition with hypoglycemia without coma, with long-term current use of insulin (HCC)   Troponin I above reference range   Pneumonia   Sacral pressure ulcer   Atrial fibrillation with rapid ventricular response (HCC)   Pressure injury of skin   Atelectasis   Aspiration into airway   Cardiac arrest, cause unspecified [facility])            Hospital Course:   73 yo female with ESRD on HD MWF, CHF, previous CVA with residual left sided weakness, T2DM, right BKA, and seizure disorder. She presented to [facility] on 12/09/2023 with confusion, left sided chest pain, shortness of breath, and coughing. She was found to be hypoxic on room air and placed on 2 L [place]. CT chest was done without evidence of PE.  MRI revealed a 1.3cm acute to early subacute right PICA infarct. Also was admitted with pneumonia due to COVID and RSV and was started on antibiotics for likely superimposed bacterial infection.    On 1/30 early morning the patient was ~2 hrs into dialysis when her  blood pressure dropped. UF goal was decreased but since the patient's level of consciousnesses decreased, she received 300 cc NS bolus. EKG changes were noted and rapid response was activated, dialysis stopped, and blood returned to the patient. Around 6:30 AM the patient was noted to be apneic without a pulse and code blue was called. Patient was noted to have PEA arrest. CPR was done for a few minutes before ROSC was achieved. She was transferred to 62M and patient was intubated due to failure to protect airway.   She continued to be tenuous and minimally responsive, finally she was extubated on 12/03/2023 transferred to my care on 12/09/2023, early in the morning she was fairly stable however within a few hours she had an episode of emesis witnessed by her husband subsequently developed A-fib RVR and respiratory distress, cardiology PCCM again called bedside and she was transferred to ICU, she stayed there for another 5 days stabilized however still dependent on NG tube feeds, was seen by palliative care.  She was transferred to progressive care on 12/16/2023 again.   Overall condition report is extremely tenuous, still very high risk for aspiration, where she had multiple events of aspiration pneumonia, despite being n.p.o., with aspiration precaution, even on her oral secretions, she remains significant amount on tube feeding via core track tube feed, even with that she kept aspirating requiring multiple IV antibiotics courses, family wanted to hold off PEG tube placement  as it requires holding of DAPT to 5 days and increases her risk of ischemic stroke, she was seen by speech therapy placed on oral diet, core track was removed on 12/23/2023 but subsequently she aspirated again requiring core track replacement on 12/25/2023.  She now has worsening leukocytosis and evidence of active C. difficile infection as well, ID, PCCM, renal involved.  Also repeat CT scan of C-spine on 12/26/2023 shows suspicion for discitis or  osteomyelitis, neurosurgery been consulted, she is not a surgical candidate, ID has been involved, recommended to hold on IV antibiotics and to continue to monitor clinically given her CDF colitis, palliative medicine has been following closely with the family, to address goals of care, as patient will multiple comorbidities, compensated at baseline, she has been having significant difficulty with outpatient dialysis even prior to this admission which had extensive medical problems, required multiple blood units transfusion during hospital stay as well, on 01/20/2024, patient had another cardiac arrest event, she was initially bradycardic, then was pulseless, CPR was initiated, and she was intubated, she was transferred to ICU to have another cardiac arrest, she was noted to have melena, and robust bleeding from her ETT, best efforts, ROSC was not achieved, and time of death was declared at 16: 24.  Assessment and Plan:   Significant Hospital Events & Prcedures:    1/23 admit to [facility] for acute CVA + covid/rsv pneumonia 1/30 admit to ICU after patient had PEA arrest  2/1 Failed SBT due to apnea. ABG with respiratory alkalosis. RR decreased 2/2 Tolerating SBT. Remains encephalopathic 2/5 Continues to tolerate SBT during day, but remains encephalopathic/unable to protect airway if extubated  2/9 extubated, off precedex 2/12 still lethargic, ABG 2/11 unremarkable, limit centrally acting meds 2/15 transferred to my care, still somnolent on NG tube feeds, aspirated her vomitus, again in respiratory distress,  back to ICU 12/14/2023 transferred by from ICU, transferred to my service on 12/16/2023 12/19/2023.  Modified barium swallow.  Started on dysphagia 1 diet with honey thick liquids 12/23/2023.  NG tube discontinued 12/24/2023.  CT chest.  Suspicious for worsening pneumonia 12/25/2023, will reinsert core track/NG tube n.p.o. by mouth 12/26/2023 CT C-spine - 1. High-grade height loss of the anterior inferior left  greater than right aspect of the C6 vertebral body. This is entirely new from 05/05/2021 CT. Irregularity of the C6-7 endplates suspicious for discitis/osteomyelitis in the appropriate clinical setting.  12/26/2023 C. difficile positive 12/27/2023 1 unit PRBC transfusion hemoglobin stable at 8.1 on 3/6 12/31/2023 cardiac arrest, was transferred to ICU, another cardiac arrest and CPR, ROSC not achieved, TOD 16: 24   -Acute CVA  Workup significant for right PICA infarct on 1/24, resolved New acute central left cerebellar infarct with dysphagia, previous residual left-sided hemiparesis -Seen by neurology, with recommendation for dual antiplatelet therapy and statin -PEG tube held back per family request, unfortunately has aspirated oral diet again on 12/24/2023.  He was resumed back on cortrack tube feed  PEA arrest:  -Initial arrest 1/30, due to hypotension during HD transfer to ICU .  Acute metabolic encephalopathy:  -Encephalopathy present on admission, secondary to stroke /acute CVA, as well she did sustain worsening encephalopathy after initial cardiac arrest event,  metabolic derangements, drug-induced as well, offending medication including cefepime Seroquel and amitriptyline have been stopped -Mentation remains poor during hospital stay   COVID 19 pneumonia RSV pneumonia with possible bacterial superinfection -Was treated with steroids and antibiotics on admission.  Multiple episodes of aspiration pneumonia, aspiration on 12/09/2023 followed  by acute hypoxic respiratory failure with respiratory distress on core track tube feed, went back to ICU, commenced on dysphagia 1 diet on 12/23/2023 but now evidence of aspiration pneumonia again on dysphagia 1 diet.  -Patient is high risk for aspiration, with multiple aspiration pneumonia episode during hospital stay, even when she is on tube feed, she aspirates on her own secretions.    C. difficile infection.  -C. difficile positive on 12/26/2023.  By ID  with recommendation for 14 days of p.o. vancomycin And to minimize all IV antibiotics. -  She did require multiple courses of IV antibiotics treatment given recurrent events of sepsis related to aspiration pneumonia.     C6-7 osteodiscitis -CT cervical spine showed a nonspecific bone marrow signal of c6 vertebral body, neurosurgery input greatly appreciated, this is most likely concerning for C6 osteodiscitis, she is not a surgical candidate. -Difficult situation here, given this patient with C. difficile colitis in the setting of multiple IV antibiotic treatments, so plan for conservative management currently, with clinical monitoring with serial CRPs, ESR and repeat imaging in 2 to 3 weeks for further evaluation. . -She is C-diff  positive, carries high risk for prolonged antibiotic treatment, mentation to monitor clinically of IV antibiotics including ESR, CRP and repeat imaging      Paroxysmal atrial fibrillation with question of possible underlying sick sinus syndrome.   -Seen by cardiology.  on oral amiodarone.  Long-term anticoagulation not recommended by cardiology this admission.  Hypertension  - on hydralazine via tube.   Seizure disorder  -  keppra 500 mg BID , seen by neurology this admission.   ESRD on HD MWF - Dialysis per nephro  Anemia of chronic kidney disease -She required multiple units transfusion during hospital stay, most recently 3/5, with most recent hemoglobin stable at 8.1.    DM. type II    Pressure ulcer, stage II    Pressure Injury 12/02/23 Sacrum Medial Stage 2 -  Partial thickness loss of dermis presenting as a shallow open injury with a red, pink wound bed without slough. (Active)  12/02/23 1402  Location: Sacrum  Location Orientation: Medial  Staging: Stage 2 -  Partial thickness loss of dermis presenting as a shallow open injury with a red, pink wound bed without slough.  Wound Description (Comments):   Present on Admission: No               Consultations:   PCCM, Cards, Pall Care, Renal, ID, IR, general surgery, N.Surgery   The results of significant diagnostics from this hospitalization (including imaging, microbiology, ancillary and laboratory) are listed below for reference.   Significant Diagnostic Studies: DG Chest Port 1 View Result Date: 12/26/2023 CLINICAL DATA:  [id number] with shortness of breath. EXAM: PORTABLE CHEST 1 VIEW COMPARISON:  Chest CT without contrast 12/24/2023 at 12:13 p.m. FINDINGS: 6:21 a.m. Feeding tube extends well into the stomach but the radiopaque tip is not in the film. Right IJ dialysis catheter again terminates in the right atrium. There is stable cardiomegaly, mild central vascular fullness without edema. Similar appearance of airspace disease in the lung bases, minimal pleural effusions. Remaining lungs clear. Stable mediastinum with aortic atherosclerosis. No new osseous findings. Moderate thoracic dextroscoliosis. IMPRESSION: 1. Similar appearance of airspace disease in the lung bases, minimal pleural effusions. 2. Stable cardiomegaly and mild central vascular fullness without edema. 3. Aortic atherosclerosis. 4. Support apparatus as above. Electronically Signed   By: Almira Bar M.D.   On: 12/26/2023 07:14   CT  CERVICAL SPINE WO CONTRAST Result Date: 12/25/2023 CLINICAL DATA:  Cervical radiculopathy.  Infection suspected. EXAM: CT CERVICAL SPINE WITHOUT CONTRAST TECHNIQUE: Multidetector CT imaging of the cervical spine was performed without intravenous contrast. Multiplanar CT image reconstructions were also generated. RADIATION DOSE REDUCTION: This exam was performed according to the departmental dose-optimization program which includes automated exposure control, adjustment of the mA and/or kV according to patient size and/or use of iterative reconstruction technique. COMPARISON:  CT chest, abdomen, and pelvis 10/24/2023, CT chest 11/16/2023 CTA neck 05/05/2021 FINDINGS: Alignment: The  atlantodens interval is intact. There is 5 mm combined retrolisthesis/retropulsion of the posteroinferior aspect of the C6 vertebral body at the C6-7 disc level (sagittal series 11, image 34). Skull base and vertebrae: There is high-grade height loss of the anterior inferior left greater than right aspect of the C6 vertebral body. This height loss is entirely new from 05/05/2021 CT. This is mildly worsened from 11/16/2023 and more definitively worsened from 10/24/2023 CTs. There is moderate concave scalloping of the mid to anterior aspect of the C6 inferior endplate. There is irregularity of the C6-7 endplates suspicious for discitis/osteomyelitis in the appropriate clinical setting. Mild sclerosis around this endplate irregularity. Moderate to severe T1-T2 disc space narrowing. No acute fracture is seen. There is opacification of the visualized portion of the left mastoid air cells. There is normal variant incomplete closure of the posterior arch of C1. Soft tissues and spinal canal: No prevertebral fluid or swelling. No visible canal hematoma. Nasogastric tube is partially visualized. No paravertebral collection is seen to indicate an abscess. Disc levels: C2-3: Mild bilateral facet joint hypertrophy. Mild right-greater-than-left uncovertebral hypertrophy. Mild right-greater-than-left posterior disc bulge. Mild right lateral recess narrowing. Very mild right neuroforaminal narrowing without true stenosis. No central canal stenosis. C3-4: Mild-to-moderate bilateral facet joint hypertrophy. Mild bilateral uncovertebral hypertrophy. Mild posterior disc bulge. Mild right neuroforaminal stenosis. No central canal stenosis. C4-5: Mild-to-moderate bilateral facet joint hypertrophy. No posterior disc bulge. No central canal or neuroforaminal stenosis. C5-6: Mild-to-moderate bilateral facet joint hypertrophy. Mild-to-moderate bilateral uncovertebral hypertrophy. Mild right neuroforaminal narrowing. No central canal  stenosis. C6-7: There is up to 5 mm combined retrolisthesis/retropulsion of the posteroinferior aspect of the C6 vertebral body. This contributes to mild central canal stenosis. Mild right neuroforaminal stenosis. C7-T1: Mild bilateral facet joint hypertrophy. No posterior disc bulge, central canal narrowing, or neuroforaminal stenosis. Upper chest: The lung apices are unremarkable. Other: There are dense atherosclerotic calcifications within the visualized carotid bulbs. Partial visualization of a right internal jugular approach central venous catheter. IMPRESSION: 1. High-grade height loss of the anterior inferior left greater than right aspect of the C6 vertebral body. This is entirely new from 05/05/2021 CT. Irregularity of the C6-7 endplates suspicious for discitis/osteomyelitis in the appropriate clinical setting. Recommend clinical correlation. 2. There is up to 5 mm combined retrolisthesis/retropulsion of the posteroinferior aspect of the C6 vertebral body at the C6-7 disc level. This contributes to mild C6-7 central canal stenosis. 3. No paravertebral collection is seen to indicate an abscess. 4. Mild right C3-4, C5-6, and C6-7 neuroforaminal stenosis. Electronically Signed   By: Neita Garnet M.D.   On: 12/25/2023 17:16   DG Abd Portable 1V Result Date: 12/25/2023 CLINICAL DATA:  Feeding tube placement. EXAM: PORTABLE ABDOMEN - 1 VIEW COMPARISON:  Abdominal x-ray dated December 01, 2023. FINDINGS: Feeding tube tip in the distal stomach near the pylorus. Large-bore enteric tube no longer identified. Normal bowel gas pattern. Oral contrast throughout the relatively decompressed colon. IMPRESSION: 1. Feeding  tube tip in the distal stomach near the pylorus. Electronically Signed   By: Obie Dredge M.D.   On: 12/25/2023 12:34   CT CHEST WO CONTRAST Result Date: 12/24/2023 CLINICAL DATA:  Pneumonia, complication suspected, xray done EXAM: CT CHEST WITHOUT CONTRAST TECHNIQUE: Multidetector CT imaging of the  chest was performed following the standard protocol without IV contrast. RADIATION DOSE REDUCTION: This exam was performed according to the departmental dose-optimization program which includes automated exposure control, adjustment of the mA and/or kV according to patient size and/or use of iterative reconstruction technique. COMPARISON:  Chest radiographs 12/24/2023 and 12/21/2023. Chest CT 11/16/2023 and 10/24/2023 FINDINGS: Cardiovascular: Atherosclerosis of the aorta, great vessels and coronary arteries. Right IJ hemodialysis catheter extends well into the right atrium near the tricuspid valve. Stable mild cardiac enlargement without significant pericardial effusion. Mediastinum/Nodes: Grossly stable right hilar enlargement, suboptimally evaluated on noncontrast imaging. No enlarged mediastinal or axillary lymph nodes are identified. The esophagus is fluid-filled and mildly distended. Lungs/Pleura: No significant residual pleural effusion or pneumothorax. Similar dependent left lower lobe airspace disease, likely atelectasis. Mildly increased band like opacity in the right middle and lower lobes, also likely atelectasis. Upper abdomen: The visualized upper abdomen appears stable, without acute findings. There are diffuse vascular calcifications. Mild renal cortical thinning bilaterally. Musculoskeletal/Chest wall: Multiple left-sided rib fractures are demonstrated, not seen on recent chest CT. These have ill-defined margins and mild callus, likely subacute. Fractures of the left 4th and 5th ribs are mildly displaced (image 143/7). There are nondisplaced fractures of the left 2nd, 3rd and 6th ribs. There is a stable chronic moderate convex right thoracolumbar scoliosis. Severe disc space narrowing, irregularity and vertebral body partial collapse at C6-7. These findings are similar to the previous chest CT but new from CT of the neck 06/22/2022. This appearance can be seen with discitis/osteomyelitis or  hemodialysis associated spondyloarthropathy. IMPRESSION: 1. Multiple left-sided rib fractures, not seen on recent chest CT, likely subacute. 2. No significant residual pleural effusion or pneumothorax. Similar dependent left lower lobe airspace disease, likely atelectasis. Mildly increased band like opacity in the right middle and lower lobes, also likely atelectasis. In this clinical context, findings could be due to pneumonia. 3. Severe disc space narrowing, irregularity and vertebral body partial collapse at C6-7 which could reflect discitis/osteomyelitis or hemodialysis associated spondyloarthropathy. Consider further evaluation with CT or MRI (preferred) of the cervical spine. 4. Fluid-filled and mildly distended esophagus, likely due to reflux. 5.  Aortic Atherosclerosis (ICD10-I70.0). Electronically Signed   By: Carey Bullocks M.D.   On: 12/24/2023 13:37   DG Chest Port 1 View Result Date: 12/24/2023 CLINICAL DATA:  Shortness of breath EXAM: PORTABLE CHEST 1 VIEW COMPARISON:  12/21/2023 FINDINGS: Reverse apical lordotic positioning. Dialysis catheter tip at low right atrium. Removal of feeding tube. Moderate cardiomegaly. Pleuroparenchymal opacification in the inferior left hemithorax is similar. Subsegmental atelectasis at the right lung base is unchanged. No pneumothorax. IMPRESSION: 1. Similar pleuroparenchymal opacification in the inferior left hemithorax. Likely trace pleural fluid and adjacent airspace disease. 2.  Cardiomegaly without congestive failure. Electronically Signed   By: Jeronimo Greaves M.D.   On: 12/24/2023 10:20   DG Chest Port 1 View Result Date: 12/21/2023 CLINICAL DATA:  Shortness of breath.  End-stage renal disease. EXAM: PORTABLE CHEST 1 VIEW COMPARISON:  12/20/2023 FINDINGS: Right chest wall dialysis catheter noted with tips in the projection of the right atrium. Feeding tube tip courses below the field of view. Stable cardiomediastinal contours. Persistent opacity within the left  lower lung. Right lung is clear. No new changes. IMPRESSION: No change in aeration to the left lower lung. Electronically Signed   By: Signa Kell M.D.   On: 12/21/2023 06:48   DG Chest Port 1 View Result Date: 12/20/2023 CLINICAL DATA:  [id number] SOB (shortness of breath) [id number] EXAM: PORTABLE CHEST 1 VIEW COMPARISON:  Chest x-ray 12/19/2023, CT chest 11/16/2023 FINDINGS: Right chest wall dialysis catheter with tip overlying the right atrium. The heart and mediastinal contours are unchanged. Atherosclerotic plaque. Coronary artery calcification. Retrocardiac airspace opacity. Left base atelectasis. No pulmonary edema. Persistent trace to small left pleural effusion. Possible trace right pleural effusion. No pneumothorax. No acute osseous abnormality. IMPRESSION: 1. Retrocardiac airspace opacity. 2. Persistent trace to small left pleural effusion. 3. Possible trace right pleural effusion. 4.  Aortic Atherosclerosis (ICD10-I70.0). Electronically Signed   By: Tish Frederickson M.D.   On: 12/20/2023 07:01   DG Swallowing Func-Speech Pathology Result Date: 12/19/2023 Table formatting from the original result was not included. Modified Barium Swallow Study Patient Details Name: Carolyn Russo MRN: [id number] Date of Birth: Dec 29, 1950 Today's Date: 12/19/2023 HPI/PMH: HPI: Pt is a 73 y.o. female with who presented from SNF on 11/[facility]/23 for chest pain, weakness and confusion. CT head revealed infarct in inferior right cerebellum which was new since 06/12/22. MRI brain with acute right inferomedial cerebellar ischemic infarction in the right PICA territory. Pt also diagnosed with COVID/RSV. On 2/15  transferred back to ICU for atrial fibrillation with rapid ventricular response that developed after a gross aspiration event from vomiting. MBS 1/28 DIGEST swallow severity rating of 1, mild. Recommended dys2/thin liquids. Secondary swallows needed to clear pharyngeal residue. Experienced cardiac arrest on 1/30 and was intubated  until 2/9 (ten days) and remained NPO thereafter with cortrak. PMH: CVA in 2022 and 2023, right BKA, HTN, DM-2, HLD, ESRD on HD MWF, asthma, dysphagia, CHF, heart murmur, seizure. Repeat instrumental assessment with MBS. Clinical Impression: Clinical Impression: Pt demonstrated improved swallow function compared to FEES on 2/18 and was adequately awake. Oral impairments include slow lingual transport and mild-mod lingual surface and tongue base residue that spills to to pyriform sinuses followed by spontaneous swallow which cleared. At end of study she had mild vallecular residue with honey thick that cleared with cued second swallow. Pt exhibits decreased timing with one episode of  teaspoon honey thick penetrating before the swallow and aspirated during with strong reflexive cough. Penetrates cleared however shoulder obscuring view of trachea. Residue penetrated x 1 with honey thick (PAS 5) out of multiple trials during the swallow. Teaspoon nectar thick also penetrated prior to laryngeal closure and aspirated during the swallow with a strong reflexive delayed cough (could not detect if ejected from trachea with shoulder obstruction however cleared the vestibule). Solid and thin liquids were not administered and order of bolus presentation of MBS-Imp not followed this study. She was given a DIGEST score of 2 (moderate impairment). Recommend pt initiate a Dys 1 (puree), honey thick liquid given by 1/2 teaspoon amounts only when adequately awake,  second swallow and pt will require full assist/supervision with meals. ST will continue to follow for safety and efficiency. Results relayed to MD and pt's husband via phone. Factors that may increase risk of adverse event in presence of aspiration [employer]ye [employer]ance Coots 2021): Factors that may increase risk of adverse event in presence of aspiration [employer]ye [employer]aks [employer] Clearance Coots 2021): Reduced cognitive function Recommendations/Plan: Swallowing Evaluation Recommendations Swallowing  Evaluation Recommendations Recommendations: PO diet PO Diet Recommendation: Dysphagia  1 (Pureed); Moderately thick liquids (Level 3, honey thick) (1/2 tsp with honey) Liquid Administration via: Spoon (1/2 teaspoon) Medication Administration: Crushed with puree Supervision: Staff to assist with self-feeding; Full supervision/cueing for swallowing strategies Swallowing strategies  : Minimize environmental distractions; Slow rate; Small bites/sips; Multiple dry swallows after each bite/sip Postural changes: Position pt fully upright for meals Oral care recommendations: Oral care BID (2x/day) Treatment Plan Treatment Plan Treatment recommendations: Therapy as outlined in treatment plan below Follow-up recommendations: Skilled nursing-short term rehab (<3 hours/day) Functional status assessment: Patient has had a recent decline in their functional status and demonstrates the ability to make significant improvements in function in a reasonable and predictable amount of time. Treatment frequency: Min 2x/week Treatment duration: 2 weeks Interventions: Aspiration precaution training; Patient/family education; Diet toleration management by SLP Recommendations Recommendations for follow up therapy are one component of a multi-disciplinary discharge planning process, led by the attending physician.  Recommendations may be updated based on patient status, additional functional criteria and insurance authorization. Assessment: Orofacial Exam: Orofacial Exam Oral Cavity: Oral Hygiene: WFL Oral Cavity - Dentition: Adequate natural dentition Orofacial Anatomy: WFL Oral Motor/Sensory Function: WFL Anatomy: Anatomy: Suspected cervical osteophytes Boluses Administered: Boluses Administered Boluses Administered: Mildly thick liquids (Level 2, nectar thick); Moderately thick liquids (Level 3, honey thick); Puree  Oral Impairment Domain: Oral Impairment Domain Lip Closure: No labial escape Tongue control during bolus hold: -- (unable to  hold bolus) Bolus preparation/mastication: -- (did not give solid) Bolus transport/lingual motion: Slow tongue motion Oral residue: Residue collection on oral structures Location of oral residue : Tongue Initiation of pharyngeal swallow : Valleculae; Pyriform sinuses  Pharyngeal Impairment Domain: Pharyngeal Impairment Domain Soft palate elevation: No bolus between soft palate (SP)/pharyngeal wall (PW) Laryngeal elevation: Complete superior movement of thyroid cartilage with complete approximation of arytenoids to epiglottic petiole Anterior hyoid excursion: Partial anterior movement Epiglottic movement: Complete inversion Laryngeal vestibule closure: Incomplete, narrow column air/contrast in laryngeal vestibule Pharyngeal stripping wave : Present - complete Pharyngeal contraction (A/P view only): N/A Pharyngoesophageal segment opening: Complete distension and complete duration, no obstruction of flow Tongue base retraction: Narrow column of contrast or air between tongue base and PPW Pharyngeal residue: Collection of residue within or on pharyngeal structures Location of pharyngeal residue: Valleculae  Esophageal Impairment Domain: Esophageal Impairment Domain Esophageal clearance upright position: Complete clearance, esophageal coating Pill: No data recorded Penetration/Aspiration Scale Score: Penetration/Aspiration Scale Score 1.  Material does not enter airway: Puree 5.  Material enters airway, CONTACTS cords and not ejected out: Mildly thick liquids (Level 2, nectar thick); Moderately thick liquids (Level 3, honey thick) 6.  Material enters airway, passes BELOW cords then ejected out: Moderately thick liquids (Level 3, honey thick) 7.  Material enters airway, passes BELOW cords and not ejected out despite cough attempt by patient: Mildly thick liquids (Level 2, nectar thick) Compensatory Strategies: Compensatory Strategies Compensatory strategies: Yes Multiple swallows: Effective Effective Multiple Swallows:  Moderately thick liquid (Level 3, honey thick)   General Information: Caregiver present: No  Diet Prior to this Study: NPO; Cortrak/Small bore NG tube   Temperature : Normal   Respiratory Status: WFL   Supplemental O2: Nasal cannula   History of Recent Intubation: Yes  Behavior/Cognition: Alert; Cooperative; Pleasant mood; Requires cueing Self-Feeding Abilities: Needs assist with self-feeding Baseline vocal quality/speech: Dysphonic No data recorded No data recorded Exam Limitations: Limited visibility (with shoulder to determine if aspirate cleared out with cough) Goal Planning: Prognosis for improved oropharyngeal function: -- (fair-good) Barriers to Reach  Goals: Cognitive deficits No data recorded No data recorded Consulted and agree with results and recommendations: Patient; Family member/caregiver Pain: Pain Assessment Pain Assessment: No/denies pain Faces Pain Scale: 0 Breathing: 0 Negative Vocalization: 0 Facial Expression: 0 Body Language: 0 Consolability: 0 PAINAD Score: 0 Facial Expression: 0 Body Movements: 0 Muscle Tension: 0 Compliance with ventilator (intubated pts.): N/A Vocalization (extubated pts.): 0 CPOT Total: 0 End of Session: Start Time:SLP Start Time (ACUTE ONLY): 1220 Stop Time: SLP Stop Time (ACUTE ONLY): 1241 Time Calculation:SLP Time Calculation (min) (ACUTE ONLY): 21 min Charges: SLP Evaluations $ SLP Speech Visit: 1 Visit SLP Evaluations $MBS Swallow: 1 Procedure $Swallowing Treatment: 1 Procedure SLP visit diagnosis: SLP Visit Diagnosis: Dysphagia, oropharyngeal phase (R13.12) Past Medical History: Past Medical History: Diagnosis Date  Acute GI bleeding   Allergy   Anemia   Anterior chest wall pain   Appendicitis 1965  Asthma   Body mass index 37.0-37.9, adult   Breast pain   Cataract   both eyes  CHF (congestive heart failure) (HCC)   Cognitive change 04/20/2021  r/t cva 03/2821  Complication of anesthesia   Memory loss after general  Dehydration 2014  Deviated septum 1971  Diabetes  mellitus   Dysphagia due to old stroke   easy to get strangled when eating  Dyspnea 2014  ESRD on hemodialysis (HCC)   MWF at [facility]  Extrinsic asthma   WITH ASTHMA ATTACK  Fibroid 1980  GERD (gastroesophageal reflux disease)   Heart murmur   History of migraine   History of seizure   with stroke  Hx gestational diabetes   Hyperlipidemia   Hypertension 2014  Inguinal hernia 1959  Malaise and fatigue 2014  Non-IgE mediated allergic asthma 2014  Obesity   Pelvic pain   Pregnancy, high-risk 1985  Stroke (HCC) 04/20/2021  (CVA) of right basal ganglia  Tonsillitis 1968  Uterine fibroid 1980  Visual field defect   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 Past Surgical History: Past Surgical History: Procedure Laterality Date  ABDOMINAL AORTOGRAM W/LOWER EXTREMITY N/A 02/21/2022  Procedure: ABDOMINAL AORTOGRAM W/LOWER EXTREMITY;  Surgeon: Maeola Harman, MD;  Location: [facility] INVASIVE CV LAB;  Service: Cardiovascular;  Laterality: N/A;  AMPUTATION Right 05/18/2022  Procedure: RIGHT BELOW KNEE AMPUTATION;  Surgeon: Nadara Mustard, MD;  Location: [name] OR;  Service: Orthopedics;  Laterality: Right;  AMPUTATION TOE Right 04/15/2022  Procedure: AMPUTATION  LST TOERIGHT FOOT;  Surgeon: Edwin Cap, DPM;  Location: [facility] ORS;  Service: Podiatry;  Laterality: Right;  APPENDECTOMY  1959  BASCILIC VEIN TRANSPOSITION Right 04/30/2021  Procedure: RIGHT FIRST STAGE BASCILIC VEIN TRANSPOSITION;  Surgeon: Chuck Hint, MD;  Location: [facility] OR;  Service: Vascular;  Laterality: Right;  CESAREAN SECTION  1985  COLONOSCOPY    ESOPHAGOGASTRODUODENOSCOPY (EGD) WITH PROPOFOL N/A 04/18/2021  Procedure: ESOPHAGOGASTRODUODENOSCOPY (EGD) WITH PROPOFOL;  Surgeon: Iva Boop, MD;  Location: [facility] ENDOSCOPY;  Service: Endoscopy;  Laterality: N/A;  EYE SURGERY    bilateral cataract   FLEXIBLE SIGMOIDOSCOPY N/A 04/18/2021  Procedure: FLEXIBLE SIGMOIDOSCOPY;  Surgeon: Iva Boop, MD;  Location: [facility] ENDOSCOPY;  Service: Endoscopy;  Laterality: N/A;  HERNIA  REPAIR  1959  IR FLUORO GUIDE CV LINE RIGHT  03/18/2021  IR US GUIDE VASC ACCESS RIGHT  03/18/2021  LEFT HEART CATH AND CORONARY ANGIOGRAPHY N/A 04/04/2017  Procedure: Left Heart Cath and Coronary Angiography;  Surgeon: Lyn Records, MD;  Location: [facility] INVASIVE CV LAB;  Service: Cardiovascular;  Laterality: N/A;  MYOMECTOMY  1980, 2004, 2007  PERIPHERAL VASCULAR THROMBECTOMY  02/21/2022  Procedure: PERIPHERAL VASCULAR THROMBECTOMY;  Surgeon: Maeola Harman, MD;  Location: [facility] INVASIVE CV LAB;  Service: Cardiovascular;;  Right Tibial  RHINOPLASTY  1971  ROTATOR CUFF REPAIR  2003  SURGICAL REPAIR OF HEMORRHAGE  2015  TONSILLECTOMY  1968 Royce Macadamia 12/19/2023, 3:06 PM  DG Chest Port 1 View Result Date: 12/19/2023 CLINICAL DATA:  Shortness of breath EXAM: PORTABLE CHEST 1 VIEW COMPARISON:  Yesterday FINDINGS: Stable cardiomegaly. Improved aeration in the right upper lobe along the minor fissure. Retrocardiac density attributed atelectasis and underpenetration by prior CT. No effusion or pneumothorax seen. Dialysis catheter with tip at the right atrium. Feeding tube which at least reaches the stomach. IMPRESSION: Improved aeration on the right.  No new finding. Electronically Signed   By: Tiburcio Pea M.D.   On: 12/19/2023 07:14   DG Chest Port 1 View Result Date: 12/18/2023 CLINICAL DATA:  Shortness of breath. EXAM: PORTABLE CHEST 1 VIEW COMPARISON:  Radiographs 12/16/2023 and 12/12/2023.  CT 11/16/2023. FINDINGS: 0802 hours. Feeding tube projects below the diaphragm, tip not visualized. Right IJ hemodialysis catheter projects over the inferior aspect of the right atrium, unchanged. The heart size and mediastinal contours are stable. Persistent vascular congestion with increased linear atelectasis or fluid along the right minor fissure. The overall aeration of the left lung base has mildly improved. No pneumothorax. The bones appear unchanged. There is a moderate thoracolumbar scoliosis.  IMPRESSION: Persistent vascular congestion with increased linear atelectasis or fluid along the right minor fissure. Mildly improved aeration of the left lung base. Electronically Signed   By: Carey Bullocks M.D.   On: 12/18/2023 08:25   DG Chest Port 1 View Result Date: 12/16/2023 CLINICAL DATA:  Shortness of breath. EXAM: PORTABLE CHEST 1 VIEW COMPARISON:  December 12, 2023. FINDINGS: Stable cardiomegaly. Feeding tube is seen entering stomach. Right internal jugular dialysis catheter is unchanged. Minimal right basilar subsegmental atelectasis is noted. Mild left retrocardiac opacity is noted concerning for atelectasis or infiltrate with associated effusion. Bony thorax is unremarkable. IMPRESSION: Stable left retrocardiac opacity as noted above. Minimal right basilar subsegmental atelectasis. Electronically Signed   By: Lupita Raider M.D.   On: 12/16/2023 07:52   DG Chest Port 1 View Result Date: 12/12/2023 CLINICAL DATA:  [id number] Pneumonia [id number] EXAM: PORTABLE CHEST 1 VIEW COMPARISON:  December 09, 2023 FINDINGS: The cardiomediastinal silhouette is unchanged in contour. The enteric tube courses through the chest to the abdomen beyond the field-of-view. RIGHT chest CVC tip terminates over the RIGHT atrium. Small bilateral pleural effusion. No pneumothorax. Persistent heterogeneous opacity of the RIGHT lung and LEFT retrocardiac area. This is similar in comparison to prior. IMPRESSION: Persistent heterogeneous opacity of the RIGHT lung and LEFT retrocardiac area. This is similar in comparison to prior. Electronically Signed   By: Meda Klinefelter M.D.   On: 12/12/2023 08:09   CT HEAD WO CONTRAST ( ) Result Date: 12/11/2023 CLINICAL DATA:  Altered mental status EXAM: CT HEAD WITHOUT CONTRAST TECHNIQUE: Contiguous axial images were obtained from the base of the skull through the vertex without intravenous contrast. RADIATION DOSE REDUCTION: This exam was performed according to the departmental  dose-optimization program which includes automated exposure control, adjustment of the mA and/or kV according to patient size and/or use of iterative reconstruction technique. COMPARISON:  11/28/2023 FINDINGS: Brain: There is no mass, hemorrhage or extra-axial collection. There is generalized atrophy without lobar predilection. Hypodensity of the white matter is most commonly associated with chronic microvascular disease.  Old small vessel infarct of the right basal ganglia. Vascular: Atherosclerotic calcification of the internal carotid arteries at the skull base. No abnormal hyperdensity of the major intracranial arteries or dural venous sinuses. Skull: The visualized skull base, calvarium and extracranial soft tissues are normal. Sinuses/Orbits: Bilateral mastoid and middle ear effusions Other: None. IMPRESSION: 1. No acute intracranial abnormality. 2. Old small vessel infarct of the right basal ganglia and findings of chronic microvascular disease. 3. Bilateral mastoid and middle ear effusions. Electronically Signed   By: Deatra [name] M.D.   On: 12/11/2023 21:08   DG CHEST PORT 1 VIEW Result Date: 12/09/2023 CLINICAL DATA:  [id number] Respiratory failure [facility]) [id number] Atrial fibrillation with RVR (HCC) [id number] EXAM: PORTABLE CHEST 1 VIEW COMPARISON:  December 06, 2023 FINDINGS: The cardiomediastinal silhouette is unchanged in contour. The enteric tube courses through the chest to the abdomen beyond the field-of-view. RIGHT chest CVC with tip terminating over the RIGHT atrium. Small bilateral pleural effusions. No pneumothorax. Increased platelike opacity at the RIGHT lung base. Persistent LEFT retrocardiac opacity. IMPRESSION: Small bilateral pleural effusions with increased platelike opacity at the RIGHT lung base, favored to reflect atelectasis. Differential considerations include superimposed aspiration or infection. Electronically Signed   By: Meda Klinefelter M.D.   On: 12/09/2023 11:27   DG CHEST  PORT 1 VIEW Result Date: 12/06/2023 CLINICAL DATA:  Productive cough, leukocytosis. EXAM: PORTABLE CHEST 1 VIEW COMPARISON:  12/01/2023 and CT chest 11/16/2023. FINDINGS: Interval extubation. Feeding tube is followed into the stomach with the tip projecting beyond the inferior margin of the image. Right IJ central line tip is at the SVC RA junction or high right atrium. Heart is enlarged, stable. Thoracic aorta is calcified. Lungs are low in volume with interstitial prominence and indistinctness. Bibasilar atelectasis. IMPRESSION: Vascular crowding versus mild pulmonary edema. Electronically Signed   By: Leanna Battles M.D.   On: 12/06/2023 12:28   DG Abd Portable 1V Result Date: 12/01/2023 CLINICAL DATA:  Check feeding catheter placement EXAM: PORTABLE ABDOMEN - 1 VIEW COMPARISON:  11/23/2023 FINDINGS: Nasogastric catheter and weighted feeding catheter are noted in the distal stomach. No free air is seen. Contrast is noted within the colon. IMPRESSION: Feeding catheters are noted within the stomach. Electronically Signed   By: Alcide Clever M.D.   On: 12/01/2023 15:18   DG CHEST PORT 1 VIEW Result Date: 12/01/2023 CLINICAL DATA:  Fever. EXAM: PORTABLE CHEST 1 VIEW COMPARISON:  November 28, 2023. FINDINGS: Stable cardiomediastinal silhouette. Endotracheal and nasogastric tubes are unchanged. Right internal jugular dialysis catheter is unchanged. Both lungs are clear. The visualized skeletal structures are unremarkable. IMPRESSION: No active disease. Electronically Signed   By: Lupita Raider M.D.   On: 12/01/2023 10:06    Microbiology: Recent Results (from the past 240 hours)  C Difficile Quick Screen (NO PCR Reflex)     Status: Abnormal   Collection Time: 12/26/23  6:02 AM   Specimen: STOOL  Result Value Ref Range Status   C Diff antigen POSITIVE (A) NEGATIVE Final    Comment: CRITICAL RESULT CALLED TO, READ BACK BY AND VERIFIED WITH: D GRIFFIN,RN@0720  12/26/23 MK    C Diff toxin POSITIVE (A)  NEGATIVE Final   C Diff interpretation Toxin producing C. difficile detected.  Final    Comment: Performed at [facility], [address] [place], [place] [address]      Signed: Huey Bienenstock, MD 01/22/2024

## 2024-01-23 NOTE — Progress Notes (Addendum)
 PCCM Progress Note  73 yo F with many medical conditions who has been hospitalized for 42 days with multiple ICU transfers & 1 PEA arrest, being followed by palliative care for GOC -- sustained a second PEA arrest this admission 01/09/2024. Sounds like bradycardia preceding loss of pulses. ROSC after about 5 rounds.   She was transferred to the ICU intubated, NE via IO. Pulses were palpated throughout transport. After moving to ICU bed, pressures started dropping and NE was increased. Noted to have obvious GIB. As orders were being placed for emergent release blood, patient lost pulses- 1613, PEA.   Received epi bicarb compressions, at 2 pulse checks was in VF and was DF + received amio, epi, calcium.  She began bleeding robustly from her ETT.  Despite best efforts, ROSC was not achieved and TOD was declared at 16:24    Family has been notified.    CCT independent of procedures: 37 minutes  Tessie Fass MSN, AGACNP-BC [facility] Pulmonary/Critical Care Medicine 12/29/2023, 4:40 PM

## 2024-01-23 NOTE — Significant Event (Signed)
 Received call from telemetry that patient hr was in the 30s, upon arrival into room, patients hr was 0, chest compressions started immediately and code blue is called

## 2024-01-23 NOTE — Progress Notes (Addendum)
 Daily Progress Note   Patient Name: Carolyn Russo       Date: 12/30/2023 DOB: 12-Sep-1951  Age: 73 y.o. MRN#: [id number] Attending Physician: Starleen Arms, MD Primary Care Physician: Annita Brod, MD Admit Date: 11/16/2023 Length of Stay: 42 days  Reason for Consultation/Follow-up: Establishing goals of care  HPI/Patient Profile:  54F with ESRD on HD, CHF, prior CVA, DMII, right BKA and seizure disorder who is admitted with acute hypoxemic respiratory failure due to Covid and RSV pneumonia, c/b acute right PICA infarct and PEA arrest. Extubated 2/9 and stable on [place], has lots of secretions. Concern for high risk of deterioration. Family states would want full scope of care, full code. Husband is a physician and has support from their son. They want to engage with palliative team for support.   Subjective:   Subjective: Chart Reviewed. Updates received. Patient Assessed. Created space and opportunity for patient  and family to explore thoughts and feelings regarding current medical situation.  Today's Discussion: Today saw the patient at bedside, she is resting/sleeping peacefully I elected to not wake her.  Her respirations are even and unlabored.  Vital signs are stable.  No family was present.  I spent time discussing with the medical team, nursing team, and SLP.  Today the patient has been quite lethargic and intermittent coughing.  There is suspicion by SLP for aspiration of secretions.  This was communicated to the patient's husband.  Otherwise, the patient has been stable.  Husband left a little while ago per nursing.  Medical team discontinued ongoing discussions about the severity of her illness.  Appears still waiting for contact with neurosurgery, although had they have made attempts.  I told the team that I would follow-up tomorrow to check in and see how things are going, help support further discussions amongst family to elicit updated goals of care. I provided emotional and  general support through therapeutic listening, empathy, sharing of stories, and other techniques. I answered all questions and addressed all concerns to the best of my ability.   Review of Systems  Unable to perform ROS (Patient was sleeping and I elected to not try to wake her)  Objective:   Vital Signs:  BP (!) 129/57 (BP Location: Left Arm)   Pulse 84   Temp 100.2 F (37.9 C) (Axillary)   Resp (!) 34   Ht 5\' 3"  (1.6 m)   Wt 75.1 kg   SpO2 97%   BMI 29.33 kg/m   Physical Exam Vitals and nursing note reviewed.  Constitutional:      General: She is sleeping. She is not in acute distress.    Appearance: She is ill-appearing.  HENT:     Head: Normocephalic and atraumatic.  Cardiovascular:     Rate and Rhythm: Normal rate.  Pulmonary:     Effort: Pulmonary effort is normal. No respiratory distress.  Skin:    General: Skin is warm and dry.     Palliative Assessment/Data: 10% (CoreTrak in place)    Existing Vynca/ACP Documentation: None  Assessment & Plan:   Impression: Present on Admission:  Pneumonia  SUMMARY OF RECOMMENDATIONS   Full code Full scope of care Awaiting discussion with neurosurgery Palliative medicine will follow-up tomorrow We will support ongoing GOC discussions Palliative medicine will continue to follow  Symptom Management:  Per primary team PMT is available to assist as needed  Code Status: Full code  Prognosis: Unable to determine  Discharge Planning: To Be Determined  Discussed with: Medical  team, nursing team, patient's family  Thank you for allowing [place] to participate in the care of Carolyn Russo PMT will continue to support holistically.  Time Total: 27 min  Detailed review of medical records (labs, imaging, vital signs), medically appropriate exam, discussed with treatment team, counseling and education to patient, family, & staff, documenting clinical information, medication management, coordination of care  Wynne Dust, NP Palliative Medicine Team  Team Phone # [phone] (Nights/Weekends)  06/22/2021, 8:17 AM

## 2024-01-23 NOTE — Progress Notes (Signed)
 Daily Progress Note   Patient Name: Carolyn Russo       Date: 12/23/2023 DOB: August 22, 1951  Age: 73 y.o. MRN#: [id number] Attending Physician: Starleen Arms, MD Primary Care Physician: Annita Brod, MD Admit Date: 11/16/2023 Length of Stay: 42 days  Reason for Consultation/Follow-up: Establishing goals of care  HPI/Patient Profile:  38F with ESRD on HD, CHF, prior CVA, DMII, right BKA and seizure disorder who is admitted with acute hypoxemic respiratory failure due to Covid and RSV pneumonia, c/b acute right PICA infarct and PEA arrest. Extubated 2/9 and stable on [place], has lots of secretions. Concern for high risk of deterioration. Family states would want full scope of care, full code. Husband is a physician and has support from their son. They want to engage with palliative team for support.   Subjective:   Subjective: Chart Reviewed. Updates received. Patient Assessed. Created space and opportunity for patient  and family to explore thoughts and feelings regarding current medical situation.  Today's Discussion: Today saw the patient at bedside, she is resting/sleeping peacefully I elected to not wake her.  Her respirations are even and unlabored.  Vital signs are stable.  No family was present.  I discussed with the patient bedside nurse, reviewed current situation.  Requested attention to the tube feeds, apparently beeping but unsure if they are currently being held purposefully.  Discussed ongoing palliative involvement for ongoing goals of care.  After saw the patient I called her husband.  We had a pretty extensive discussion.  Neurosurgery did reach out to him this morning over the phone and went over images and indicates that there is no definitive infection but probably is.  MRI would better characterize, although it would unlikely change course.  They are not planning surgical intervention because there is no abscess to drain.  Only possible treatment would be antibiotics, but  they are holding off for now due to C. difficile due to multiple courses of antibiotics.  Otherwise the plan is to monitor labs and follow for worsening.  We discussed how she is in the past couple days.  He admits that she is more sleepy and drowsy.  Previously she is able to communicate.  We discussed that this is likely because of multiple infections and reminded him of our conversation about her "having to fight multiple difficult battles."  He states that today she did respond in the morning, specifically with RT because his tends to stimulate her a bit.  We reviewed her current ordered medications, only antibiotic she is currently receiving is oral vancomycin for C. difficile.  We discussed that she is quite ill and high risk for things to worsen.  Part of this discussion was reviewing her labs today including white blood cell count remains quite elevated, procalcitonin, CRP.  He maintains hope that she will improve, hopes that she can get over these infections and get back to strengthening and try to get better.  We shared that every day we will improve for the best, but palliative medicine would continue to be involved for ongoing discussions depending on how she does clinically in the coming days.  We discussed taking it a day at a time and supporting her as best as we can.  At the end of the conversation the patient asked about a mattress to help with her wound care.  I shared that I would followed this information on to the medical team.  I told the team that I would follow-up tomorrow to check  in and see how things are going, help support further discussions amongst family to elicit updated goals of care. I provided emotional and general support through therapeutic listening, empathy, sharing of stories, and other techniques. I answered all questions and addressed all concerns to the best of my ability.  Review of Systems  Unable to perform ROS (Patient was sleeping and I elected to not try  to wake her)  Objective:   Vital Signs:  BP (!) 141/56 (BP Location: Left Arm)   Pulse 97   Temp 99 F (37.2 C) (Axillary)   Resp (!) 24   Ht 5\' 3"  (1.6 m)   Wt 75.1 kg   SpO2 96%   BMI 29.33 kg/m   Physical Exam Vitals and nursing note reviewed.  Constitutional:      General: She is sleeping. She is not in acute distress.    Appearance: She is ill-appearing.  HENT:     Head: Normocephalic and atraumatic.  Cardiovascular:     Rate and Rhythm: Normal rate.  Pulmonary:     Effort: Pulmonary effort is normal. No respiratory distress.  Abdominal:     General: Abdomen is flat. Bowel sounds are normal. There is no distension.     Palpations: Abdomen is soft.  Skin:    General: Skin is warm and dry.     Palliative Assessment/Data: 10% (CoreTrak in place)    Existing Vynca/ACP Documentation: None  Assessment & Plan:   Impression: Present on Admission:  Pneumonia  SUMMARY OF RECOMMENDATIONS   Full code Full scope of care Conservative management per surgery Requesting mattress pad to help with wound Continued goals of care conversations as clinical picture evolves Palliative medicine will continue to follow  Symptom Management:  Per primary team PMT is available to assist as needed  Code Status: Full code  Prognosis: Unable to determine  Discharge Planning: To Be Determined  Discussed with: Medical team, nursing team, patient's family  Thank you for allowing [place] to participate in the care of Erendira MAE DENUNZIO PMT will continue to support holistically.  Time Total: 78 min  Detailed review of medical records (labs, imaging, vital signs), medically appropriate exam, discussed with treatment team, counseling and education to patient, family, & staff, documenting clinical information, medication management, coordination of care  Wynne Dust, NP Palliative Medicine Team  Team Phone # [phone] (Nights/Weekends)  06/22/2021, 8:17 AM

## 2024-01-23 NOTE — Plan of Care (Signed)
 progressing

## 2024-01-23 NOTE — Anesthesia Procedure Notes (Signed)
 Procedure Name: Intubation Date/Time: 01/17/2024 4:00 PM  Performed by: Darryl Nestle, CRNAPre-anesthesia Checklist: Patient identified, Emergency Drugs available, Suction available and Patient being monitored Patient Re-evaluated:Patient Re-evaluated prior to induction Oxygen Delivery Method: Ambu bag Preoxygenation: Pre-oxygenation with 100% oxygen Laryngoscope Size: Mac, 4 and Glidescope Grade View: Grade I Tube type: Oral Tube size: 7.5 mm Number of attempts: 1 Airway Equipment and Method: Stylet and Oral airway Placement Confirmation: ETT inserted through vocal cords under direct vision, positive ETCO2, breath sounds checked- equal and bilateral and CO2 detector Secured at: 22 cm Tube secured with: Tape Dental Injury: Teeth and Oropharynx as per pre-operative assessment

## 2024-01-23 NOTE — Progress Notes (Signed)
 Occupational Therapy Treatment Patient Details Name: Carolyn Russo MRN: [id number] DOB: 1951-05-12 Today's Date: 01/08/2024   History of present illness Pt is a 73 yo female presenting to [facility] on 11/16/23 for chest pain, weakness, and confusion. CT head revealed infarct in the inferior right cerebellum. MRI brain with acute right inferomedial cerebellar ischemic infarction in the right PICA territory. Pt also diagnosed with Covid/RSV. PEA arrest 1/30. New L cerebellar infarct found on MRI 2/5. Extubated 2/9. PMH: R BKA on 7/26, DM2, HTN, HLD, ESRD on HD MWF, hx of CVA 03/2021 & 05/2022, asthma, CHF, dysphagia, heart murmur, seizure (CT suspicious for worsening pneumonia)   OT comments  Patient with decreased ability to participate today, alerted only briefly with sternal rub, wet wash cloth, neck/ head and BUE ROM.  Weak cough noted.  Patient did not follow any commands today.  Have noted dx pneumonia which may be contributing to decreased ability to participate.  Will continue to follow and progress patient as able.        If plan is discharge home, recommend the following:  A lot of help with bathing/dressing/bathroom;Two people to help with walking and/or transfers;Assistance with cooking/housework;Direct supervision/assist for medications management;Assistance with feeding;Direct supervision/assist for financial management;Assist for transportation;Help with stairs or ramp for entrance   Equipment Recommendations  Hoyer lift;Hospital bed    Recommendations for Other Services      Precautions / Restrictions Precautions Precautions: Fall Recall of Precautions/Restrictions: Impaired Precaution/Restrictions Comments: Monitor O2/RR, cortrak, L hemi Required Braces or Orthoses: Other Brace Other Brace: RLE prosthetic in room, per neuro note 3/4 -- cervical collar when OOB, but not present in room 3/5 Restrictions Weight Bearing Restrictions Per Provider Order: No Other Position/Activity  Restrictions: R BKA- prosthetic in room       Mobility Bed Mobility Overal bed mobility: Needs Assistance             General bed mobility comments: head/neck rotated L and flexed    Transfers                         Balance                                           ADL either performed or assessed with clinical judgement   ADL                                         General ADL Comments: Total A for all ADls bed level    Extremity/Trunk Assessment Upper Extremity Assessment Upper Extremity Assessment: RUE deficits/detail;LUE deficits/detail RUE Deficits / Details: Able to assist with ROM and then actively scratched head LUE Deficits / Details: no active movement felt today - palm protector in place no signs of break down            Vision       Perception     Praxis     Communication Communication Communication: Impaired   Cognition Arousal: Obtunded   Cognition: Difficult to assess Difficult to assess due to: Impaired communication           OT - Cognition Comments: Opened eyes halfway and only briefly  Following commands: Impaired        Cueing      Exercises Exercises: General Upper Extremity, Shoulder, Hand exercises (Hand/ wrist  PROM x5 all joints) General Exercises - Upper Extremity Shoulder Flexion: PROM, Both, 5 reps, Supine Shoulder Extension: Both, 5 reps, Supine Shoulder Horizontal ABduction: PROM, Both, 5 reps, Supine Elbow Flexion: PROM, Both, 5 reps, Supine Elbow Extension: PROM, Both, 5 reps, Supine Other Exercises Other Exercises: Cervical PROM x5 and repositioning to neutral    Shoulder Instructions       General Comments no family present today    Pertinent Vitals/ Pain       Pain Assessment Pain Assessment: Faces Faces Pain Scale: Hurts little more Pain Location: neck Pain Descriptors / Indicators: Discomfort, Moaning  Home Living                                           Prior Functioning/Environment              Frequency  Min 1X/week        Progress Toward Goals  OT Goals(current goals can now be found in the care plan section)  Progress towards OT goals: Not progressing toward goals - comment (Patient with decreased arousal)  Acute Rehab OT Goals OT Goal Formulation: Patient unable to participate in goal setting Time For Goal Achievement: 01/04/24 Potential to Achieve Goals: Poor ADL Goals Pt Will Perform Grooming: with mod assist;sitting Pt Will Perform Lower Body Bathing: sitting/lateral leans;with min assist Pt Will Perform Upper Body Dressing: with min assist;sitting Pt Will Perform Lower Body Dressing: with mod assist;sitting/lateral leans Pt Will Transfer to Toilet: with min assist;with +2 assist;stand pivot transfer;bedside commode Pt/caregiver will Perform Home Exercise Program: Increased strength;Increased ROM;Both right and left upper extremity;With written HEP provided Additional ADL Goal #1: In sitting patient will be able to hold head up with mod A and mod cues for 2 minutes to engage in an activity Additional ADL Goal #2: In sitting patient will be able to use RUE actively to assist with rolling, sitting balance or scooting with moderate verbal and tactile cueing  Plan      Co-evaluation          OT goals addressed during session: Strengthening/ROM      AM-PAC OT "6 Clicks" Daily Activity     Outcome Measure   Help from another person eating meals?: Total Help from another person taking care of personal grooming?: Total Help from another person toileting, which includes using toliet, bedpan, or urinal?: Total Help from another person bathing (including washing, rinsing, drying)?: Total Help from another person to put on and taking off regular upper body clothing?: Total Help from another person to put on and taking off regular lower body clothing?: Total 6 Click  Score: 6    End of Session Equipment Utilized During Treatment: Oxygen  OT Visit Diagnosis: Muscle weakness (generalized) (M62.81);Other symptoms and signs involving cognitive function;Pain;Other abnormalities of gait and mobility (R26.89);Unsteadiness on feet (R26.81);Other (comment) Hemiplegia - Right/Left: Left Hemiplegia - dominant/non-dominant: Non-Dominant Hemiplegia - caused by: Cerebral infarction   Activity Tolerance Patient limited by lethargy   Patient Left in bed;with call bell/phone within reach;with bed alarm set   Nurse Communication Other (comment) (decrease in ability to participate)        Time: 1308-6578 OT Time Calculation (min): 20 min  Charges: OT General Charges $  OT Visit: 1 Visit OT Treatments $Therapeutic Exercise: 8-22 mins  Hal Neer OTR/L   Malachi Bonds 01/15/2024, 10:05 AM

## 2024-01-23 NOTE — Progress Notes (Signed)
 PROGRESS NOTE                                                                                                                                                                                                             Patient Demographics:    Carolyn Russo, is a 73 y.o. female, DOB - October 09, 1951, ZOX:[id number]  Outpatient Primary MD for the patient is Annita Brod, MD    LOS - 42  Admit date - 11/16/2023    Chief Complaint  Patient presents with   Chest Pain   Dialysis pt       Brief Narrative (HPI from H&P)   73 yo female with ESRD on HD MWF, CHF, previous CVA with residual left sided weakness, T2DM, right BKA, and seizure disorder. She presented to [facility] on 1/23 with confusion, left sided chest pain, shortness of breath, and coughing. She was found to be hypoxic on room air and placed on 2 L [place]. CT chest was done without evidence of PE.  MRI revealed a 1.3cm acute to early subacute right PICA infarct. Also was admitted with pneumonia due to COVID and RSV and was started on antibiotics for likely superimposed bacterial infection.    On 1/30 early morning the patient was ~2 hrs into dialysis when her blood pressure dropped. UF goal was decreased but since the patient's level of consciousnesses decreased, she received 300 cc NS bolus. EKG changes were noted and rapid response was activated, dialysis stopped, and blood returned to the patient. Around 6:30 AM the patient was noted to be apneic without a pulse and code blue was called. Patient was noted to have PEA arrest. CPR was done for a few minutes before ROSC was achieved. She was transferred to 58M and patient was intubated due to failure to protect airway.  She continued to be tenuous and minimally responsive, finally she was extubated on 12/03/2023 transferred to my care on 12/09/2023, early in the morning she was fairly stable however within a few hours she had an episode of  emesis witnessed by her husband subsequently developed A-fib RVR and respiratory distress, cardiology PCCM again called bedside and she was transferred to ICU, she stayed there for another 5 days stabilized however still dependent on NG tube feeds, was seen by palliative care.  She was transferred to my service on 12/16/2023 again.  Overall condition report is extremely tenuous, still very high risk for aspiration, still overall extremely weak and deconditioned, family for now wants to pursue all modalities of treatment.  She is currently on NG tube, wanted to hold off PEG tube placement as it requires holding of DAPT to 5 days and increases her risk of ischemic stroke, she was seen by speech therapy placed on oral diet, core track was removed on 12/23/2023 but subsequently she aspirated again requiring core track replacement on 12/25/2023.  She now has worsening leukocytosis and evidence of active C. difficile infection as well, ID, PCCM, renal involved.  Also repeat CT scan of C-spine on 12/26/2023 shows suspicion for discitis or osteomyelitis, neurosurgery will opine as well.  Again long-term prognosis does not appear good, multiple medical problems with extremely poor baseline.   Significant Hospital Events & Prcedures:    1/23 admit to [facility] for acute infarct + covid/rsv pneumonia 1/30 admit to ICU after patient had PEA arrest  2/1 Failed SBT due to apnea. ABG with respiratory alkalosis. RR decreased 2/2 Tolerating SBT. Remains encephalopathic 2/5 Continues to tolerate SBT during day, but remains encephalopathic/unable to protect airway if extubated  2/9 extubated, off precedex 2/12 still lethargic, ABG 2/11 unremarkable, limit centrally acting meds 2/15 transferred to my care, still somnolent on NG tube feeds, aspirated her vomitus, again in respiratory distress, considered back to ICU 12/14/2023 transferred by from ICU, transferred to my service on 12/16/2023 12/19/2023.  Modified barium swallow.  Started  on dysphagia 1 diet with honey thick liquids 12/23/2023.  NG tube discontinued 12/24/2023.  CT chest.  Suspicious for worsening pneumonia 12/25/2023, will reinsert core track/NG tube n.p.o. by mouth 12/26/2023 CT C-spine - 1. High-grade height loss of the anterior inferior left greater than right aspect of the C6 vertebral body. This is entirely new from 05/05/2021 CT. Irregularity of the C6-7 endplates suspicious for discitis/osteomyelitis in the appropriate clinical setting.  12/26/2023 C. difficile positive 12/27/2023 1 unit PRBC transfusion   Unfortunately overall patient continues to appear quite weak and frail, sleepy NG tube feeding dependent, very weak cough reflex, very high risk for recurrent aspiration of oral secretions and regurgitated food material, overall still bedbound, overall prognosis appears poor explained has been relayed by previous MD to husband, I have discussed with husband at bedside as well 12/27/2023.    Subjective:   Patient had fever 100.7 over last 24 hours, she remains altered, cannot provide any complaints or provide any history.  Hgb improved this morning after receiving 1 unit PRBC.   Assessment  & Plan :    PEA arrest:  -due to hypotension during HD, not primarily cardiac in nature, intubated for airway protection and stabilized in ICU, seen by cardiology initially, now stable from cardiac arrest standpoint.   Acute Bolick encephalopathy:  -secondary to post-arrest, stroke, metabolic derangements, and drug induced. Concern for cefepime toxicity given her hx of ESRD, med was discontinued 2/4. MRI brain did show a new and acute central left cerebellar infarct.  Appreciate neurology recs.  Stroke not felt to be significant enough to explain altered mental status, Seroquel and amitriptyline have been stopped -Mentation remains significantly poor, somnolent.   Multiple episodes of aspiration pneumonia, aspiration on 12/09/2023 followed by acute hypoxic respiratory failure  with respiratory distress on core track tube feed, went back to ICU, commenced on dysphagia 1 diet on 12/23/2023 but now evidence of aspiration pneumonia again on dysphagia 1 diet.  -Patient is high risk for aspiration, with multiple  aspiration pneumonia episode during hospital stay, even when she is on tube feed, she aspirates on her own secretions. -She remains high risk for recurrent aspiration events. -Received multiple courses of IV antibiotics treatment aspiration pneumonia, most recently finished yesterday 12/27/2023. -Plan was for for peg tube, but family wanted to hold for now I did not want to hold her dual antiplatelet therapy. -SLP following closely  -Keep tube feed at 35 cc/h as she is high risk for aspiration.   C. difficile infection.  -C. difficile positive on 12/26/2023. -Continue with p.o. vancomycin x 14 days recommendations -Will try to minimize all IV antibiotics for now.  She did require multiple courses of IV antibiotics treatment given recurrent events of sepsis related to aspiration pneumonia.    C6-7 osteodiscitis -CT cervical spine showed a nonspecific bone marrow signal of c6 vertebral body, neurosurgery input greatly appreciated, this is most likely concerning for C6 osteodiscitis, she is not a surgical candidate. -Difficult situation here, given this patient with C. difficile colitis in the setting of multiple IV antibiotic treatments, so plan for conservative management currently, with clinical monitoring with serial CRPs, ESR and repeat imaging in 2 to 3 weeks for further evaluation. . -She is C-diff  positive, carries high risk for prolonged antibiotic treatment, continue to monitor off IV antibiotics for now, treat C. difficile and this can be monitored clinically as discussed with ID, with serial CRPs and ESR.     -Acute CVA  Workup significant for right PICA infarct on 1/24, resolved New acute central left cerebellar infarct with dysphagia, previous residual  left-sided hemiparesis - Appreciate neuro follow up , on DAPT with aspirin and brilinta along with statin,  -PEG tube held back per family request, unfortunately has aspirated oral diet again on 12/24/2023.  She is back on core track tube feeds.      Paroxysmal atrial fibrillation with question of possible underlying sick sinus syndrome.   -Seen by cardiology.  Currently on oral amiodarone.  Long-term anticoagulation not recommended by cardiology this admission. -Needs 30-day event monitor upon discharge.  Hypertension  - Currently on hydralazine via tube.   Seizure disorder  - Continue home keppra 500 mg BID seen by neurology this admission.   ESRD on HD MWF - Dialysis per nephro - Discontinued renvela due to low phos levels   Anemia of chronic disease -She required multiple units transfusion during hospital stay, most recently yesterday, hemoglobin stable this morning at 8.1   Unstageable sacral decubitus ulcer.  Kindly see picture placed in the chart on multiple dates going back 12/02/23, wound care team following, evaluated by general surgery 12/25/2023, no intervention suggested by general surgery, no infection noted.  Continue wound care discussed with ostomy nurses as well.  Clearly explained to the family that sacral decubitus ulcer due to bedbound state usually is gradually progressive.  Continue Flexi-Seal to protect the skin as much as possible and frequent turning along with wound care.  DM. type II -CBG remains elevated despite increasing her estradiol scale frequency to every 4 hours, so I will start on low-dose Semglee 5 units.    CBG (last 3)  Recent Labs    12/27/23 2122 12/27/23 2348 01/02/2024 0352  GLUCAP 225* 237* 227*         Condition - Extremely Guarded  Family Communication  :    D/W Husband at bedside 3/5   Code Status :  Full  Consults  :  PCCM, Cards, Pall Care, Renal, ID, IR, general  surgery, N.Surgery  PUD Prophylaxis : PPI   Procedures  :             Disposition Plan  :    Status is: Inpatient    DVT Prophylaxis  :    SCDs Start: 11/23/23 0716 heparin injection 5,000 Units Start: 11/17/23 0930     Lab Results  Component Value Date   PLT 410 (H) 01/20/2024    Diet :  Diet Order             Diet NPO time specified Except for: Sips with Meds  Diet effective now                    Inpatient Medications  Scheduled Meds:  arformoterol  15 mcg Nebulization BID   aspirin  81 mg Oral Daily   budesonide (PULMICORT) nebulizer solution  0.5 mg Nebulization BID   Chlorhexidine Gluconate Cloth  6 each Topical Q0600   clopidogrel  75 mg Oral Daily   darbepoetin (ARANESP) injection - DIALYSIS  200 mcg Subcutaneous Q Mon-1800   feeding supplement (PROSource TF20)  60 mL Per Tube Daily   ferrous sulfate  300 mg Oral Daily   fiber supplement (BANATROL TF)  60 mL Oral BID   heparin injection (subcutaneous)  5,000 Units Subcutaneous Q8H   hydrALAZINE  50 mg Oral Q8H   insulin aspart  0-9 Units Subcutaneous Q4H   leptospermum manuka honey  1 Application Topical Daily   levETIRAcetam  500 mg Oral BID   melatonin  3 mg Oral QHS   multivitamin  1 tablet Oral QHS   nutrition supplement (JUVEN)  1 packet Per Tube BID BM   mouth rinse  15 mL Mouth Rinse Q4H   rosuvastatin  10 mg Oral Daily   sodium chloride flush  3 mL Intravenous Q12H   vancomycin  125 mg Oral Q6H   Continuous Infusions:  feeding supplement (NEPRO CARB STEADY) 1,000 mL (12/27/23 1649)   PRN Meds:.acetaminophen **OR** acetaminophen, docusate, ipratropium-albuterol, labetalol, ondansetron (ZOFRAN) IV, mouth rinse, polyethylene glycol, Racepinephrine HCl, white petrolatum, witch hazel-glycerin    Objective:   Vitals:   12/27/23 2124 01/20/2024 0000 12/25/2023 0647 01/03/2024 0800  BP: (!) 141/53 (!) 131/51 (!) 126/53 (!) 129/57  Pulse: 92   84  Resp:    (!) 34  Temp: 99.5 F (37.5 C) 100.2 F (37.9 C)    TempSrc: Axillary Axillary    SpO2: 99%    97%  Weight:      Height:        Wt Readings from Last 3 Encounters:  12/27/23 75.1 kg  11/01/23 79.5 kg  08/24/23 83 kg     Intake/Output Summary (Last 24 hours) at 01/16/2024 1108 Last data filed at 12/27/2023 1345 Gross per 24 hour  Intake 375 ml  Output --  Net 375 ml    Physical Exam  Somnolent, frail, deconditioned does not follow any commands or answer any questions R BKA, NG tube Symmetrical Chest wall movement, tachypneic RRR,No Gallops,Rubs or new Murmurs, No Parasternal Heave +ve B.Sounds, Abd Soft, No tenderness, No rebound - guarding or rigidity. No Cyanosis, Clubbing or edema, Right BKA     RN pressure injury documentation: Pressure Injury 12/02/23 Sacrum Medial Stage 2 -  Partial thickness loss of dermis presenting as a shallow open injury with a red, pink wound bed without slough. (Active)  12/02/23 1402  Location: Sacrum  Location Orientation: Medial  Staging: Stage 2 -  Partial thickness loss of dermis presenting as a shallow open injury with a red, pink wound bed without slough.  Wound Description (Comments):   Present on Admission: No  Dressing Type Foam - Lift dressing to assess site every shift 12/27/23 1100      Data Review:    Recent Labs  Lab 12/24/23 0550 12/25/23 0526 12/26/23 0852 12/26/23 1625 12/27/23 0519 12/31/2023 0535  WBC 23.8* 29.0* 32.0* 25.0* 25.2* 25.5*  HGB 7.4* 7.4* 6.3* 7.5* 6.9* 8.1*  HCT 22.1* 21.9* 18.3* 21.5* 20.1* 23.7*  PLT 552* 577* 492* 387 416* 410*  MCV 92.9 93.6 92.0 89.6 89.7 87.1  MCH 31.1 31.6 31.7 31.3 30.8 29.8  MCHC 33.5 33.8 34.4 34.9 34.3 34.2  RDW 16.9* 16.7* 16.5* 16.5* 16.9* 17.8*  LYMPHSABS 3.7 3.9 3.3  --  4.0 3.7  MONOABS 2.6* 2.6* 2.4*  --  2.0* 2.6*  EOSABS 0.1 0.2 0.1  --  0.5 0.4  BASOSABS 0.1 0.1 0.1  --  0.5* 0.1    Recent Labs  Lab 12/22/23 0552 12/23/23 0556 12/24/23 0550 12/25/23 0526 12/25/23 1134 12/26/23 0852 12/26/23 1425 12/27/23 0519 01/09/2024 0535  NA 133*  --  136   --  132*  --   --   --   --   K 4.5  --  4.4  --  4.5  --   --   --   --   CL 94*  --  92*  --  91*  --   --   --   --   CO2 23  --  25  --  22  --   --   --   --   ANIONGAP 16*  --  19*  --  19*  --   --   --   --   GLUCOSE 103*  --  202*  --  166*  --   --   --   --   BUN 94*  --  81*  --  106*  --   --   --   --   CREATININE 5.14*  --  4.59*  --  6.37*  --   --   --   --   ALBUMIN 2.3*  --  2.3*  --  2.4*  --   --   --   --   CRP 7.5*   < > 12.8* 14.2*  --   --  18.3* 15.6* 13.8*  PROCALCITON 2.28   < > 1.82 1.81  --  2.87  --  2.98 3.14  PHOS 3.5  --  4.0  --  4.3  --   --   --   --   CALCIUM 9.3  --  10.1  --  9.3  --   --   --   --    < > = values in this interval not displayed.      Recent Labs  Lab 12/22/23 0552 12/23/23 0556 12/24/23 0550 12/25/23 0526 12/25/23 1134 12/26/23 0852 12/26/23 1425 12/27/23 0519 01/18/2024 0535  CRP 7.5*   < > 12.8* 14.2*  --   --  18.3* 15.6* 13.8*  PROCALCITON 2.28   < > 1.82 1.81  --  2.87  --  2.98 3.14  CALCIUM 9.3  --  10.1  --  9.3  --   --   --   --    < > = values in this interval not displayed.    ---------------------------------------------------------------------------------------------------------------  Lab Results  Component Value Date   CHOL 90 11/17/2023   HDL 38 (L) 11/17/2023   LDLCALC 37 11/17/2023   TRIG 86 12/02/2023   CHOLHDL 2.4 11/17/2023    Lab Results  Component Value Date   HGBA1C 5.8 (H) 10/26/2023    Micro Results Recent Results (from the past 240 hours)  C Difficile Quick Screen (NO PCR Reflex)     Status: Abnormal   Collection Time: 12/26/23  6:02 AM   Specimen: STOOL  Result Value Ref Range Status   C Diff antigen POSITIVE (A) NEGATIVE Final    Comment: CRITICAL RESULT CALLED TO, READ BACK BY AND VERIFIED WITH: D GRIFFIN,RN@0720  12/26/23 MK    C Diff toxin POSITIVE (A) NEGATIVE Final   C Diff interpretation Toxin producing C. difficile detected.  Final    Comment: Performed at [facility], [address] [place], [place] [address]    Radiology Report No results found.     Signature  -   Huey Bienenstock M.D on 01/18/2024 at 11:08 AM   -  To page go to [email or web address]

## 2024-01-23 NOTE — Progress Notes (Signed)
 I discussed the CT cervical spine findings with the husband.  There is concern for C6-7 osteodiscitis with C6 vertebral body erosion and kyphosis.  In this current setting, osteodiscitis is the most likely etiology.  Per infectious disease, patient overall is improving with current C. difficile treatment and aspiration pneumonia treatment.  I agree with repeat CT cervical spine in 2 to 3 weeks for further evaluation, with continued continued monitoring of sed rate.   I expressed the severity of the disease process of left osteodiscitis, with concurrent setting of aspiration pneumonia and C. difficile.  We discussed possible MRI with and without contrast if worsening erosion/sed rate.  The husband agrees with this plan at this time.  I do not recommend any neurosurgical intervention.  Please call with further questions or concerns.

## 2024-01-23 DEATH — deceased

## 2024-01-26 ENCOUNTER — Ambulatory Visit: Payer: Medicare PPO | Admitting: Obstetrics and Gynecology

## 2024-02-01 ENCOUNTER — Encounter (INDEPENDENT_AMBULATORY_CARE_PROVIDER_SITE_OTHER): Payer: Medicare PPO | Admitting: Ophthalmology

## 2024-03-14 MED FILL — Medication: Qty: 1 | Status: AC
